# Patient Record
Sex: Female | Born: 1965 | Race: Black or African American | Hispanic: No | Marital: Single | Smoking: Former smoker
Health system: Southern US, Community
[De-identification: ages and names within clinical notes are randomized; demographics above are authoritative.]

## PROBLEM LIST (undated history)

## (undated) ENCOUNTER — Ambulatory Visit: Admission: EM | Payer: Medicare HMO | Source: Home / Self Care

## (undated) DIAGNOSIS — N921 Excessive and frequent menstruation with irregular cycle: Secondary | ICD-10-CM

## (undated) DIAGNOSIS — Z801 Family history of malignant neoplasm of trachea, bronchus and lung: Secondary | ICD-10-CM

## (undated) DIAGNOSIS — Z8619 Personal history of other infectious and parasitic diseases: Secondary | ICD-10-CM

## (undated) DIAGNOSIS — E785 Hyperlipidemia, unspecified: Secondary | ICD-10-CM

## (undated) DIAGNOSIS — E041 Nontoxic single thyroid nodule: Secondary | ICD-10-CM

## (undated) DIAGNOSIS — E0500 Thyrotoxicosis with diffuse goiter without thyrotoxic crisis or storm: Secondary | ICD-10-CM

## (undated) DIAGNOSIS — Z9221 Personal history of antineoplastic chemotherapy: Secondary | ICD-10-CM

## (undated) DIAGNOSIS — I447 Left bundle-branch block, unspecified: Secondary | ICD-10-CM

## (undated) DIAGNOSIS — N189 Chronic kidney disease, unspecified: Secondary | ICD-10-CM

## (undated) DIAGNOSIS — E0590 Thyrotoxicosis, unspecified without thyrotoxic crisis or storm: Secondary | ICD-10-CM

## (undated) DIAGNOSIS — D649 Anemia, unspecified: Secondary | ICD-10-CM

## (undated) DIAGNOSIS — D759 Disease of blood and blood-forming organs, unspecified: Secondary | ICD-10-CM

## (undated) DIAGNOSIS — F419 Anxiety disorder, unspecified: Secondary | ICD-10-CM

## (undated) DIAGNOSIS — J189 Pneumonia, unspecified organism: Secondary | ICD-10-CM

## (undated) DIAGNOSIS — N200 Calculus of kidney: Secondary | ICD-10-CM

## (undated) DIAGNOSIS — I739 Peripheral vascular disease, unspecified: Secondary | ICD-10-CM

## (undated) DIAGNOSIS — Z87442 Personal history of urinary calculi: Secondary | ICD-10-CM

## (undated) DIAGNOSIS — Z872 Personal history of diseases of the skin and subcutaneous tissue: Secondary | ICD-10-CM

## (undated) DIAGNOSIS — J45909 Unspecified asthma, uncomplicated: Secondary | ICD-10-CM

## (undated) DIAGNOSIS — K219 Gastro-esophageal reflux disease without esophagitis: Secondary | ICD-10-CM

## (undated) DIAGNOSIS — E119 Type 2 diabetes mellitus without complications: Secondary | ICD-10-CM

## (undated) DIAGNOSIS — J40 Bronchitis, not specified as acute or chronic: Secondary | ICD-10-CM

## (undated) DIAGNOSIS — Z8042 Family history of malignant neoplasm of prostate: Secondary | ICD-10-CM

## (undated) DIAGNOSIS — E890 Postprocedural hypothyroidism: Secondary | ICD-10-CM

## (undated) DIAGNOSIS — K529 Noninfective gastroenteritis and colitis, unspecified: Secondary | ICD-10-CM

## (undated) DIAGNOSIS — Z923 Personal history of irradiation: Secondary | ICD-10-CM

## (undated) DIAGNOSIS — L732 Hidradenitis suppurativa: Secondary | ICD-10-CM

## (undated) DIAGNOSIS — Z803 Family history of malignant neoplasm of breast: Secondary | ICD-10-CM

## (undated) DIAGNOSIS — C73 Malignant neoplasm of thyroid gland: Secondary | ICD-10-CM

## (undated) DIAGNOSIS — T7840XA Allergy, unspecified, initial encounter: Secondary | ICD-10-CM

## (undated) DIAGNOSIS — D869 Sarcoidosis, unspecified: Secondary | ICD-10-CM

## (undated) HISTORY — DX: Allergy, unspecified, initial encounter: T78.40XA

## (undated) HISTORY — DX: Personal history of diseases of the skin and subcutaneous tissue: Z87.2

## (undated) HISTORY — DX: Nontoxic single thyroid nodule: E04.1

## (undated) HISTORY — DX: Family history of malignant neoplasm of trachea, bronchus and lung: Z80.1

## (undated) HISTORY — DX: Human immunodeficiency virus (HIV) disease: B20

## (undated) HISTORY — DX: Postprocedural hypothyroidism: E89.0

## (undated) HISTORY — DX: Hyperlipidemia, unspecified: E78.5

## (undated) HISTORY — DX: Personal history of other infectious and parasitic diseases: Z86.19

## (undated) HISTORY — DX: Noninfective gastroenteritis and colitis, unspecified: K52.9

## (undated) HISTORY — DX: Calculus of kidney: N20.0

## (undated) HISTORY — DX: Family history of malignant neoplasm of prostate: Z80.42

## (undated) HISTORY — DX: Sarcoidosis, unspecified: D86.9

## (undated) HISTORY — DX: Excessive and frequent menstruation with irregular cycle: N92.1

## (undated) HISTORY — DX: Anemia, unspecified: D64.9

## (undated) HISTORY — DX: Family history of malignant neoplasm of breast: Z80.3

## (undated) HISTORY — DX: Gastro-esophageal reflux disease without esophagitis: K21.9

## (undated) HISTORY — DX: Thyrotoxicosis with diffuse goiter without thyrotoxic crisis or storm: E05.00

## (undated) HISTORY — PX: EYE SURGERY: SHX253

## (undated) HISTORY — DX: Essential (primary) hypertension: I10

## (undated) HISTORY — DX: Thyrotoxicosis, unspecified without thyrotoxic crisis or storm: E05.90

## (undated) NOTE — *Deleted (*Deleted)
09/16/2020 Phaedra Elsey Sep 11, 1966 [id number]   HPI:  [name] is a 22 y.o. female patient of Dr Oval Linsey, with a [place] below who presents today for advanced hypertension clinic follow up.   BP , switched metoprolol to carvedilol 25 mg bid.  Returns today for follow up.  Past Medical History: hyperlipidemia 2/21: TC 217, TG 221, HDL 40, LDL 141 - on rosuvastatin 10  DM2 4/21 A1c 8.0 - on empagliflozin, glipizide, trulicity  HIV On [place] and [name]  sarcoidosis Diagnosed as teen in [place], on prednisone x 2 years, no symptoms since then  Breast cancer ER positive, HER-2 positive T2 N0 stage 1B, chemo, radiation and surgery, completed 1/21, now on tamoxifen daily  gout On prednisone at last ADV HTN visit for flare  OSA Diagnosed 2021, awaiting titration study  hypothyroidism TSA down to 6.4 (was at 17), now on Synthroid 224 mcg daily    Secondary Causes of Hypertension  Medications/Herbal: OCP, steroids, stimulants, antidepressants, weight loss medication, immune suppressants, NSAIDs, sympathomimetics, alcohol, caffeine, licorice, ginseng, St. John's wort, chemo  Sleep Apnea: CPAP pending Renal artery stenosis: Check renal artery Dopplers Hyperaldosteronism: Check once blood pressure more stable Hyper/hypothyroidism: Working on thyroid management Pheochromocytoma: (testing not indicated)  Cushing's syndrome: (testing not indicated)  Coarctation of the aorta: Negative on CT 09/2018  Blood Pressure Goal:  130/80  Current Medications: carvedilol 25 mg bid, irbesartan hctz 150/12.5 qd, spironolactone 25 mg qd  Family Hx:  Social Hx:  Diet:  Exercise:  Home BP readings:  Intolerances:   Labs: 9/21: Na 141, K 3.7, Glu 180, BUN 18, SCR 1.73, GFR 39  Wt Readings from Last 3 Encounters:  08/13/20 234 lb (106.1 kg)  08/10/20 237 lb (107.5 kg)  07/21/20 235 lb 6.4 oz (106.8 kg)   BP Readings from Last 3 Encounters:  08/13/20 (!) 162/102   07/21/20 108/80  07/16/20 (!) 156/90   Pulse Readings from Last 3 Encounters:  08/13/20 71  07/21/20 74  07/16/20 71    Current Outpatient Medications  Medication Sig Dispense Refill  . Blood Glucose Monitoring Suppl (ONETOUCH VERIO) w/Device KIT USE AS INSTRUCTED TO TEST BLOOD SUGAR UP TO 4 TIMES DAILY E11.9 1 kit 0  . carvedilol (COREG) 25 MG tablet Take 1 tablet (25 mg total) by mouth 2 (two) times daily. 180 tablet 3  . DESCOVY 200-25 MG tablet TAKE 1 TABLET BY MOUTH DAILY 30 tablet 2  . doxycycline (VIBRA-TABS) 100 MG tablet doxycycline hyclate 100 mg tablet    . empagliflozin (JARDIANCE) 10 MG TABS tablet Take 1 tablet (10 mg total) by mouth daily before breakfast. 30 tablet 6  . FLOVENT HFA 110 MCG/ACT inhaler TAKE 1 PUFF BY MOUTH TWICE A DAY (Patient taking differently: Inhale 1 puff into the lungs daily as needed (Shortness of breath). ) 36 Inhaler 1  . gabapentin (NEURONTIN) 300 MG capsule Take 1 capsule (300 mg total) by mouth 3 (three) times daily. (Patient taking differently: Take 300 mg by mouth 2 (two) times daily. ) 90 capsule 3  . glipiZIDE (GLUCOTROL) 5 MG tablet TAKE 1 TABLET (5 MG TOTAL) BY MOUTH 2 (TWO) TIMES DAILY BEFORE A MEAL. FOR DIABETES. 180 tablet 1  . glucose blood (ONETOUCH VERIO) test strip USE AS INSTRUCTED TO TEST BLOOD SUGAR DAILY 100 each 2  . irbesartan-hydrochlorothiazide (AVALIDE) 150-12.5 MG tablet TAKE 1 TABLET BY MOUTH EVERY DAY FOR BLOOD PRESSURE 90 tablet 2  . Lancets (ONETOUCH ULTRASOFT) lancets Use as  instructed to test blood sugar daily 100 each 5  . predniSONE (DELTASONE) 20 MG tablet Take 2 tablets daily for three days, then 1 tablet daily for three days for gout flare. 9 tablet 0  . rosuvastatin (CRESTOR) 10 MG tablet TAKE 1 TABLET BY MOUTH EVERY DAY FOR CHOLESTEROL (Patient taking differently: Take 10 mg by mouth daily. FOR CHOLESTEROL) 90 tablet 3  . spironolactone (ALDACTONE) 25 MG tablet TAKE 1 TABLET BY MOUTH EVERY DAY 90 tablet 3  .  SYNTHROID 112 MCG tablet Take 2 tablets (224 mcg total) by mouth daily before breakfast. 60 tablet 5  . tamoxifen (NOLVADEX) 20 MG tablet Take 1 tablet (20 mg total) by mouth daily. 90 tablet 1  . [name] 50 MG tablet TAKE 1 TABLET(50 MG) BY MOUTH DAILY 30 tablet 2  . TRULICITY [id number] SOPN INJECT CONTENTS OF 1 PEN (0.75MG ) INTO THE SKIN ONCE WEEKLY FOR DIABETES 1.5 mL 3   No current facility-administered medications for this visit.   Facility-Administered Medications Ordered in Other Visits  Medication Dose Route Frequency Provider Last Rate Last Admin  . sodium chloride flush (NS) 0.9 % injection 10 mL  10 mL Intracatheter PRN Magrinat, Virgie Dad, MD   10 mL at 09/19/18 1551    Allergies  Allergen Reactions  . Genvoya [Elviteg-Cobic-Emtricit-Tenofaf] Hives  . Lisinopril Cough  . Valsartan Other (See Comments) and Cough    Pt states she tolerates medicine now    Past Medical History:  Diagnosis Date  . Allergy   . Anemia    Normocytic  . Anxiety   . Asthma   . Blood dyscrasia   . Bronchitis 2005  . 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 Diabetes mellitus without complication ([place])   . Family history of breast cancer   . Family history of lung cancer   . Family history of prostate cancer   . Gastroenteritis 07/10/07  . GERD 07/24/2006  . GRAVE'S DISEASE 01/01/2008  . History of hidradenitis suppurativa   . History of kidney stones   . History of thrush   . HIV DISEASE 07/24/2006   dx March 05  . Hyperlipidemia   . HYPERTENSION 07/24/2006  . Hyperthyroidism 08/2006   Grave's Disease -diffuse radiotracer uptake 08/25/06 Thyroid scan-Cold nodule to R lower lobe of thyrorid  . Menometrorrhagia    hx of  . Nephrolithiasis   . 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 Personal history of chemotherapy    2020  . Personal history of radiation therapy    2020  . Pneumonia 2005  . 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 Sarcoidosis 02/08/2007   dx as a teenager in [place] from abnl CXR. Completed 2 yrs Prednisone after lung bx confirmation. No symptoms since then.  . Suppurative hidradenitis   . Thyroid cancer ([place])   . THYROID NODULE, RIGHT 02/08/2007    Last menstrual period 03/31/2014.  No problem-specific Assessment & Plan notes found for this encounter.   Tommy Medal PharmD CPP [facility] [address] [place], [place] [address] [phone]

---

## 1992-10-17 HISTORY — PX: OTHER SURGICAL HISTORY: SHX169

## 1995-10-18 HISTORY — PX: BREAST SURGERY: SHX581

## 1996-10-17 HISTORY — PX: REDUCTION MAMMAPLASTY: SUR839

## 1998-08-10 ENCOUNTER — Encounter: Payer: Self-pay | Admitting: Emergency Medicine

## 1998-08-10 ENCOUNTER — Emergency Department (HOSPITAL_COMMUNITY): Admission: EM | Admit: 1998-08-10 | Discharge: 1998-08-10 | Payer: Self-pay | Admitting: Emergency Medicine

## 1999-05-13 ENCOUNTER — Other Ambulatory Visit: Admission: RE | Admit: 1999-05-13 | Discharge: 1999-05-13 | Payer: Self-pay | Admitting: *Deleted

## 2001-08-02 ENCOUNTER — Emergency Department (HOSPITAL_COMMUNITY): Admission: EM | Admit: 2001-08-02 | Discharge: 2001-08-02 | Payer: Self-pay | Admitting: Emergency Medicine

## 2001-08-02 ENCOUNTER — Encounter: Payer: Self-pay | Admitting: Emergency Medicine

## 2001-12-15 HISTORY — PX: INCISION AND DRAINAGE PERITONSILLAR ABSCESS: SUR670

## 2001-12-25 ENCOUNTER — Emergency Department (HOSPITAL_COMMUNITY): Admission: EM | Admit: 2001-12-25 | Discharge: 2001-12-25 | Payer: Self-pay | Admitting: Emergency Medicine

## 2002-05-01 ENCOUNTER — Other Ambulatory Visit: Admission: RE | Admit: 2002-05-01 | Discharge: 2002-05-01 | Payer: Self-pay | Admitting: *Deleted

## 2002-08-17 HISTORY — PX: INCISE AND DRAIN ABCESS: PRO64

## 2002-09-10 ENCOUNTER — Ambulatory Visit (HOSPITAL_BASED_OUTPATIENT_CLINIC_OR_DEPARTMENT_OTHER): Admission: RE | Admit: 2002-09-10 | Discharge: 2002-09-10 | Payer: Self-pay | Admitting: General Surgery

## 2002-09-11 ENCOUNTER — Encounter (INDEPENDENT_AMBULATORY_CARE_PROVIDER_SITE_OTHER): Payer: Self-pay | Admitting: *Deleted

## 2002-09-14 ENCOUNTER — Emergency Department (HOSPITAL_COMMUNITY): Admission: EM | Admit: 2002-09-14 | Discharge: 2002-09-14 | Payer: Self-pay

## 2002-11-17 HISTORY — PX: DILATION AND CURETTAGE OF UTERUS: SHX78

## 2002-11-30 ENCOUNTER — Observation Stay (HOSPITAL_COMMUNITY): Admission: AD | Admit: 2002-11-30 | Discharge: 2002-11-30 | Payer: Self-pay | Admitting: Obstetrics and Gynecology

## 2002-11-30 ENCOUNTER — Encounter (INDEPENDENT_AMBULATORY_CARE_PROVIDER_SITE_OTHER): Payer: Self-pay

## 2003-10-18 DIAGNOSIS — J189 Pneumonia, unspecified organism: Secondary | ICD-10-CM

## 2003-10-18 DIAGNOSIS — J40 Bronchitis, not specified as acute or chronic: Secondary | ICD-10-CM

## 2003-10-18 HISTORY — DX: Pneumonia, unspecified organism: J18.9

## 2003-10-18 HISTORY — DX: Bronchitis, not specified as acute or chronic: J40

## 2004-01-08 ENCOUNTER — Encounter (INDEPENDENT_AMBULATORY_CARE_PROVIDER_SITE_OTHER): Payer: Self-pay | Admitting: *Deleted

## 2004-01-08 LAB — CONVERTED CEMR LAB
CD4 Count: 44 microliters
CD4 T Cell Abs: 44

## 2004-02-11 ENCOUNTER — Encounter: Admission: RE | Admit: 2004-02-11 | Discharge: 2004-02-11 | Payer: Self-pay | Admitting: Internal Medicine

## 2004-03-02 ENCOUNTER — Encounter: Admission: RE | Admit: 2004-03-02 | Discharge: 2004-03-02 | Payer: Self-pay | Admitting: Internal Medicine

## 2004-04-08 ENCOUNTER — Encounter: Admission: RE | Admit: 2004-04-08 | Discharge: 2004-04-08 | Payer: Self-pay | Admitting: Internal Medicine

## 2004-05-11 ENCOUNTER — Ambulatory Visit (HOSPITAL_COMMUNITY): Admission: RE | Admit: 2004-05-11 | Discharge: 2004-05-11 | Payer: Self-pay | Admitting: Internal Medicine

## 2004-05-11 ENCOUNTER — Encounter: Admission: RE | Admit: 2004-05-11 | Discharge: 2004-05-11 | Payer: Self-pay | Admitting: Internal Medicine

## 2004-06-14 ENCOUNTER — Other Ambulatory Visit: Admission: RE | Admit: 2004-06-14 | Discharge: 2004-06-14 | Payer: Self-pay | Admitting: Obstetrics and Gynecology

## 2004-06-23 ENCOUNTER — Ambulatory Visit: Payer: Self-pay | Admitting: Internal Medicine

## 2004-10-19 ENCOUNTER — Ambulatory Visit (HOSPITAL_COMMUNITY): Admission: RE | Admit: 2004-10-19 | Discharge: 2004-10-19 | Payer: Self-pay | Admitting: Internal Medicine

## 2004-10-19 ENCOUNTER — Ambulatory Visit: Payer: Self-pay | Admitting: Internal Medicine

## 2004-11-03 ENCOUNTER — Ambulatory Visit: Payer: Self-pay | Admitting: Internal Medicine

## 2005-03-01 ENCOUNTER — Ambulatory Visit: Payer: Self-pay | Admitting: Internal Medicine

## 2005-03-01 ENCOUNTER — Ambulatory Visit (HOSPITAL_COMMUNITY): Admission: RE | Admit: 2005-03-01 | Discharge: 2005-03-01 | Payer: Self-pay | Admitting: Internal Medicine

## 2005-03-22 ENCOUNTER — Ambulatory Visit: Payer: Self-pay | Admitting: Internal Medicine

## 2005-06-17 ENCOUNTER — Encounter (INDEPENDENT_AMBULATORY_CARE_PROVIDER_SITE_OTHER): Payer: Self-pay | Admitting: Internal Medicine

## 2005-06-17 LAB — CONVERTED CEMR LAB: Pap Smear: ABNORMAL

## 2005-06-24 ENCOUNTER — Inpatient Hospital Stay (HOSPITAL_COMMUNITY): Admission: AD | Admit: 2005-06-24 | Discharge: 2005-06-24 | Payer: Self-pay | Admitting: Obstetrics and Gynecology

## 2005-07-25 ENCOUNTER — Ambulatory Visit: Payer: Self-pay | Admitting: Internal Medicine

## 2005-07-25 ENCOUNTER — Ambulatory Visit (HOSPITAL_COMMUNITY): Admission: RE | Admit: 2005-07-25 | Discharge: 2005-07-25 | Payer: Self-pay | Admitting: Internal Medicine

## 2005-08-09 ENCOUNTER — Ambulatory Visit: Payer: Self-pay | Admitting: Internal Medicine

## 2006-01-17 ENCOUNTER — Encounter (INDEPENDENT_AMBULATORY_CARE_PROVIDER_SITE_OTHER): Payer: Self-pay | Admitting: *Deleted

## 2006-01-17 ENCOUNTER — Encounter: Admission: RE | Admit: 2006-01-17 | Discharge: 2006-01-17 | Payer: Self-pay | Admitting: Internal Medicine

## 2006-01-17 ENCOUNTER — Ambulatory Visit: Payer: Self-pay | Admitting: Internal Medicine

## 2006-01-17 LAB — CONVERTED CEMR LAB
CD4 Count: 550 microliters
HIV 1 RNA Quant: 399 copies/mL

## 2006-01-31 ENCOUNTER — Ambulatory Visit: Payer: Self-pay | Admitting: Internal Medicine

## 2006-04-17 ENCOUNTER — Encounter (INDEPENDENT_AMBULATORY_CARE_PROVIDER_SITE_OTHER): Payer: Self-pay | Admitting: *Deleted

## 2006-04-17 ENCOUNTER — Encounter: Admission: RE | Admit: 2006-04-17 | Discharge: 2006-04-17 | Payer: Self-pay | Admitting: Internal Medicine

## 2006-04-17 ENCOUNTER — Ambulatory Visit: Payer: Self-pay | Admitting: Internal Medicine

## 2006-04-17 LAB — CONVERTED CEMR LAB
CD4 Count: 460 microliters
HIV 1 RNA Quant: 399 copies/mL

## 2006-05-02 ENCOUNTER — Ambulatory Visit: Payer: Self-pay | Admitting: Internal Medicine

## 2006-07-24 ENCOUNTER — Ambulatory Visit: Payer: Self-pay | Admitting: Infectious Diseases

## 2006-07-24 ENCOUNTER — Encounter: Payer: Self-pay | Admitting: Internal Medicine

## 2006-07-24 ENCOUNTER — Encounter: Admission: RE | Admit: 2006-07-24 | Discharge: 2006-07-24 | Payer: Self-pay | Admitting: Infectious Diseases

## 2006-07-24 ENCOUNTER — Encounter (INDEPENDENT_AMBULATORY_CARE_PROVIDER_SITE_OTHER): Payer: Self-pay | Admitting: *Deleted

## 2006-07-24 DIAGNOSIS — J45909 Unspecified asthma, uncomplicated: Secondary | ICD-10-CM | POA: Insufficient documentation

## 2006-07-24 DIAGNOSIS — F329 Major depressive disorder, single episode, unspecified: Secondary | ICD-10-CM

## 2006-07-24 DIAGNOSIS — Z21 Asymptomatic human immunodeficiency virus [HIV] infection status: Secondary | ICD-10-CM | POA: Insufficient documentation

## 2006-07-24 DIAGNOSIS — K219 Gastro-esophageal reflux disease without esophagitis: Secondary | ICD-10-CM | POA: Insufficient documentation

## 2006-07-24 DIAGNOSIS — B20 Human immunodeficiency virus [HIV] disease: Secondary | ICD-10-CM

## 2006-07-24 DIAGNOSIS — R51 Headache: Secondary | ICD-10-CM | POA: Insufficient documentation

## 2006-07-24 DIAGNOSIS — I10 Essential (primary) hypertension: Secondary | ICD-10-CM | POA: Insufficient documentation

## 2006-07-24 DIAGNOSIS — R519 Headache, unspecified: Secondary | ICD-10-CM | POA: Insufficient documentation

## 2006-07-24 DIAGNOSIS — F32A Depression, unspecified: Secondary | ICD-10-CM | POA: Insufficient documentation

## 2006-07-24 HISTORY — DX: Essential (primary) hypertension: I10

## 2006-07-24 HISTORY — DX: Human immunodeficiency virus (HIV) disease: B20

## 2006-07-24 HISTORY — DX: Gastro-esophageal reflux disease without esophagitis: K21.9

## 2006-07-24 LAB — CONVERTED CEMR LAB
CD4 Count: 450 microliters
HIV 1 RNA Quant: 49 copies/mL
HIV 1 RNA Quant: <50 copies/mL (ref <50)
HIV-1 RNA Quant, Log: <1.7 (ref <1.70)
TSH: 0.109 microintl units/mL

## 2006-07-26 ENCOUNTER — Encounter (INDEPENDENT_AMBULATORY_CARE_PROVIDER_SITE_OTHER): Payer: Self-pay | Admitting: Internal Medicine

## 2006-07-26 ENCOUNTER — Ambulatory Visit: Payer: Self-pay | Admitting: Internal Medicine

## 2006-07-26 LAB — CONVERTED CEMR LAB
Bilirubin Urine: NEGATIVE
Cholesterol: 100 mg/dL (ref 0–200)
Creatinine, Urine: 74.3 mg/dL
HDL: 30 mg/dL — ABNORMAL LOW (ref >39)
Ketones, ur: NEGATIVE mg/dL
LDL Cholesterol: 48 mg/dL (ref 0–99)
Microalb Creat Ratio: 157.6 mg/g — ABNORMAL HIGH (ref 0.0–30.0)
Microalb, Ur: 11.7 mg/dL — ABNORMAL HIGH (ref 0.00–1.89)
Microalbumin U total vol: 11.7 mg/L
Nitrite: NEGATIVE
Protein, ur: 30 mg/dL — AB
Specific Gravity, Urine: 1.012 (ref 1.005–1.03)
Total CHOL/HDL Ratio: 3.3
Triglycerides: 108 mg/dL (ref <150)
Urine Glucose: NEGATIVE mg/dL
Urobilinogen, UA: 0.2 (ref 0.0–1.0)
VLDL: 22 mg/dL (ref 0–40)
pH: 8 (ref 5.0–8.0)

## 2006-08-03 ENCOUNTER — Ambulatory Visit: Payer: Self-pay | Admitting: Internal Medicine

## 2006-08-17 ENCOUNTER — Ambulatory Visit: Payer: Self-pay | Admitting: Internal Medicine

## 2006-08-17 DIAGNOSIS — E0590 Thyrotoxicosis, unspecified without thyrotoxic crisis or storm: Secondary | ICD-10-CM

## 2006-08-17 HISTORY — DX: Thyrotoxicosis, unspecified without thyrotoxic crisis or storm: E05.90

## 2006-08-24 ENCOUNTER — Encounter: Admission: RE | Admit: 2006-08-24 | Discharge: 2006-08-24 | Payer: Self-pay | Admitting: Internal Medicine

## 2006-09-15 ENCOUNTER — Encounter (INDEPENDENT_AMBULATORY_CARE_PROVIDER_SITE_OTHER): Payer: Self-pay | Admitting: Internal Medicine

## 2006-09-15 ENCOUNTER — Ambulatory Visit: Payer: Self-pay | Admitting: Internal Medicine

## 2006-09-15 LAB — CONVERTED CEMR LAB
ALT: 30 units/L (ref 0–35)
AST: 53 units/L — ABNORMAL HIGH (ref 0–37)
Albumin: 2.6 g/dL — ABNORMAL LOW (ref 3.5–5.2)
Alkaline Phosphatase: 163 units/L — ABNORMAL HIGH (ref 39–117)
BUN: 9 mg/dL (ref 6–23)
Basophils Absolute: 0 10*3/uL (ref 0.0–0.1)
Basophils Relative: 0 % (ref 0–1)
CO2: 25 meq/L (ref 19–32)
Calcium: 9.4 mg/dL (ref 8.4–10.5)
Chloride: 105 meq/L (ref 96–112)
Creatinine, Ser: 0.3 mg/dL — ABNORMAL LOW (ref 0.40–1.20)
Eosinophils Relative: 2 % (ref 0–4)
Free T4: 5.42 ng/dL — ABNORMAL HIGH (ref 0.89–1.80)
Glucose, Bld: 118 mg/dL — ABNORMAL HIGH (ref 70–99)
HCT: 32.3 % — ABNORMAL LOW (ref 34.4–43.3)
Hemoglobin: 10.9 g/dL — ABNORMAL LOW (ref 11.7–14.8)
INR: 1.1 (ref 0.0–1.5)
Lymphocytes Relative: 48 % — ABNORMAL HIGH (ref 15–43)
Lymphs Abs: 2.4 10*3/uL (ref 0.8–3.1)
MCHC: 33.6 g/dL (ref 33.1–35.4)
MCV: 80.4 fL (ref 78.8–100.0)
Monocytes Absolute: 0.5 10*3/uL (ref 0.2–0.7)
Monocytes Relative: 11 % (ref 3–11)
Neutro Abs: 1.9 10*3/uL (ref 1.8–6.8)
Neutrophils Relative %: 39 % — ABNORMAL LOW (ref 47–77)
Platelets: 371 10*3/uL (ref 152–374)
Potassium: 3.4 meq/L — ABNORMAL LOW (ref 3.5–5.3)
Prothrombin Time: 14.4 s (ref 11.6–15.2)
RBC: 4.01 M/uL (ref 3.79–4.96)
RDW: 15.9 % — ABNORMAL HIGH (ref 11.5–15.3)
Sodium: 137 meq/L (ref 135–145)
TSH: 0.156 microintl units/mL — ABNORMAL LOW (ref 0.350–5.50)
Total Bilirubin: 0.3 mg/dL (ref 0.3–1.2)
Total Protein: 6.8 g/dL (ref 6.0–8.3)
WBC: 5 10*3/uL (ref 3.7–10.0)
aPTT: 31 s

## 2006-12-05 ENCOUNTER — Inpatient Hospital Stay (HOSPITAL_COMMUNITY): Admission: AD | Admit: 2006-12-05 | Discharge: 2006-12-05 | Payer: Self-pay | Admitting: Obstetrics & Gynecology

## 2006-12-11 ENCOUNTER — Encounter (INDEPENDENT_AMBULATORY_CARE_PROVIDER_SITE_OTHER): Payer: Self-pay | Admitting: *Deleted

## 2006-12-11 LAB — CONVERTED CEMR LAB

## 2006-12-24 ENCOUNTER — Encounter (INDEPENDENT_AMBULATORY_CARE_PROVIDER_SITE_OTHER): Payer: Self-pay | Admitting: *Deleted

## 2007-01-09 ENCOUNTER — Telehealth: Payer: Self-pay | Admitting: *Deleted

## 2007-02-08 ENCOUNTER — Ambulatory Visit: Payer: Self-pay | Admitting: Internal Medicine

## 2007-02-08 ENCOUNTER — Encounter (INDEPENDENT_AMBULATORY_CARE_PROVIDER_SITE_OTHER): Payer: Self-pay | Admitting: Internal Medicine

## 2007-02-08 DIAGNOSIS — E041 Nontoxic single thyroid nodule: Secondary | ICD-10-CM | POA: Insufficient documentation

## 2007-02-08 DIAGNOSIS — E0500 Thyrotoxicosis with diffuse goiter without thyrotoxic crisis or storm: Secondary | ICD-10-CM

## 2007-02-08 DIAGNOSIS — D869 Sarcoidosis, unspecified: Secondary | ICD-10-CM | POA: Insufficient documentation

## 2007-02-08 DIAGNOSIS — F17210 Nicotine dependence, cigarettes, uncomplicated: Secondary | ICD-10-CM | POA: Insufficient documentation

## 2007-02-08 HISTORY — DX: Nontoxic single thyroid nodule: E04.1

## 2007-02-08 HISTORY — DX: Sarcoidosis, unspecified: D86.9

## 2007-02-08 HISTORY — DX: Thyrotoxicosis with diffuse goiter without thyrotoxic crisis or storm: E05.00

## 2007-02-09 LAB — CONVERTED CEMR LAB
ALT: 19 units/L (ref 0–35)
AST: 25 units/L (ref 0–37)
Albumin: 3.8 g/dL (ref 3.5–5.2)
Alkaline Phosphatase: 170 units/L — ABNORMAL HIGH (ref 39–117)
BUN: 16 mg/dL (ref 6–23)
Basophils Absolute: 0 10*3/uL (ref 0.0–0.1)
Basophils Relative: 0 % (ref 0–1)
Bilirubin Urine: NEGATIVE
CO2: 23 meq/L (ref 19–32)
Calcium: 9.3 mg/dL (ref 8.4–10.5)
Chloride: 108 meq/L (ref 96–112)
Creatinine, Ser: 0.6 mg/dL (ref 0.40–1.20)
Creatinine, Urine: 70.7 mg/dL
Eosinophils Absolute: 0.1 10*3/uL (ref 0.0–0.7)
Eosinophils Relative: 2 % (ref 0–5)
Free T4: 2.1 ng/dL — ABNORMAL HIGH (ref 0.89–1.80)
Glucose, Bld: 80 mg/dL (ref 70–99)
HCT: 38.4 % (ref 36.0–46.0)
Hemoglobin: 12.5 g/dL (ref 12.0–15.0)
Iron: 26 ug/dL — ABNORMAL LOW (ref 42–145)
Ketones, ur: NEGATIVE mg/dL
Lymphocytes Relative: 48 % — ABNORMAL HIGH (ref 12–46)
Lymphs Abs: 3.1 10*3/uL (ref 0.7–3.3)
MCHC: 32.6 g/dL (ref 30.0–36.0)
MCV: 81.5 fL (ref 78.0–100.0)
Microalb Creat Ratio: 145.7 mg/g — ABNORMAL HIGH (ref 0.0–30.0)
Microalb, Ur: 10.3 mg/dL — ABNORMAL HIGH (ref 0.00–1.89)
Monocytes Absolute: 0.6 10*3/uL (ref 0.2–0.7)
Monocytes Relative: 9 % (ref 3–11)
Neutro Abs: 2.6 10*3/uL (ref 1.7–7.7)
Neutrophils Relative %: 41 % — ABNORMAL LOW (ref 43–77)
Nitrite: POSITIVE — AB
Platelets: 331 10*3/uL (ref 150–400)
Potassium: 4.1 meq/L (ref 3.5–5.3)
Protein, ur: 30 mg/dL — AB
RBC: 4.71 M/uL (ref 3.87–5.11)
RDW: 15.1 % — ABNORMAL HIGH (ref 11.5–14.0)
Retic Count, Absolute: 65.9 (ref 19.0–186.0)
Retic Ct Pct: 1.4 % (ref 0.4–3.1)
Saturation Ratios: 7 % — ABNORMAL LOW (ref 20–55)
Sodium: 137 meq/L (ref 135–145)
Specific Gravity, Urine: 1.015 (ref 1.005–1.03)
TIBC: 371 ug/dL (ref 250–470)
TSH: 0.127 microintl units/mL — ABNORMAL LOW (ref 0.350–5.50)
Total Bilirubin: 0.2 mg/dL — ABNORMAL LOW (ref 0.3–1.2)
Total Protein: 7.4 g/dL (ref 6.0–8.3)
UIBC: 345 ug/dL
Urine Glucose: NEGATIVE mg/dL
Urobilinogen, UA: 0.2 (ref 0.0–1.0)
WBC: 6.4 10*3/uL (ref 4.0–10.5)
pH: 7 (ref 5.0–8.0)

## 2007-02-10 ENCOUNTER — Encounter (INDEPENDENT_AMBULATORY_CARE_PROVIDER_SITE_OTHER): Payer: Self-pay | Admitting: Internal Medicine

## 2007-02-11 ENCOUNTER — Telehealth (INDEPENDENT_AMBULATORY_CARE_PROVIDER_SITE_OTHER): Payer: Self-pay | Admitting: Internal Medicine

## 2007-02-15 ENCOUNTER — Ambulatory Visit (HOSPITAL_COMMUNITY): Admission: RE | Admit: 2007-02-15 | Discharge: 2007-02-15 | Payer: Self-pay | Admitting: Internal Medicine

## 2007-02-20 ENCOUNTER — Telehealth (INDEPENDENT_AMBULATORY_CARE_PROVIDER_SITE_OTHER): Payer: Self-pay | Admitting: Internal Medicine

## 2007-02-21 ENCOUNTER — Encounter (INDEPENDENT_AMBULATORY_CARE_PROVIDER_SITE_OTHER): Payer: Self-pay | Admitting: Internal Medicine

## 2007-02-22 ENCOUNTER — Encounter (INDEPENDENT_AMBULATORY_CARE_PROVIDER_SITE_OTHER): Payer: Self-pay | Admitting: Specialist

## 2007-02-22 ENCOUNTER — Other Ambulatory Visit: Admission: RE | Admit: 2007-02-22 | Discharge: 2007-02-22 | Payer: Self-pay | Admitting: Interventional Radiology

## 2007-02-22 ENCOUNTER — Encounter: Admission: RE | Admit: 2007-02-22 | Discharge: 2007-02-22 | Payer: Self-pay | Admitting: *Deleted

## 2007-05-28 ENCOUNTER — Telehealth (INDEPENDENT_AMBULATORY_CARE_PROVIDER_SITE_OTHER): Payer: Self-pay | Admitting: *Deleted

## 2007-06-04 ENCOUNTER — Encounter: Payer: Self-pay | Admitting: Internal Medicine

## 2007-06-04 ENCOUNTER — Ambulatory Visit: Payer: Self-pay | Admitting: Internal Medicine

## 2007-06-04 ENCOUNTER — Encounter: Admission: RE | Admit: 2007-06-04 | Discharge: 2007-06-04 | Payer: Self-pay | Admitting: Internal Medicine

## 2007-06-04 LAB — CONVERTED CEMR LAB
HIV 1 RNA Quant: 88 copies/mL — ABNORMAL HIGH (ref <50)
HIV-1 RNA Quant, Log: 1.94 — ABNORMAL HIGH (ref <1.70)

## 2007-06-05 ENCOUNTER — Encounter (INDEPENDENT_AMBULATORY_CARE_PROVIDER_SITE_OTHER): Payer: Self-pay | Admitting: *Deleted

## 2007-06-05 LAB — CONVERTED CEMR LAB
ALT: 14 units/L (ref 0–35)
AST: 17 units/L (ref 0–37)
Albumin: 4.1 g/dL (ref 3.5–5.2)
Alkaline Phosphatase: 175 units/L — ABNORMAL HIGH (ref 39–117)
BUN: 14 mg/dL (ref 6–23)
Basophils Absolute: 0 10*3/uL (ref 0.0–0.1)
Basophils Relative: 0 % (ref 0–1)
CO2: 21 meq/L (ref 19–32)
Calcium: 9.5 mg/dL (ref 8.4–10.5)
Chloride: 107 meq/L (ref 96–112)
Cholesterol: 140 mg/dL (ref 0–200)
Creatinine, Ser: 0.75 mg/dL (ref 0.40–1.20)
Eosinophils Absolute: 0.1 10*3/uL (ref 0.0–0.7)
Eosinophils Relative: 1 % (ref 0–5)
Free T4: 0.87 ng/dL — ABNORMAL LOW (ref 0.89–1.80)
Glucose, Bld: 96 mg/dL (ref 70–99)
HCT: 41.8 % (ref 36.0–46.0)
HDL: 44 mg/dL (ref >39)
Hemoglobin: 13.6 g/dL (ref 12.0–15.0)
LDL Cholesterol: 81 mg/dL (ref 0–99)
Lymphocytes Relative: 39 % (ref 12–46)
Lymphs Abs: 3 10*3/uL (ref 0.7–3.3)
MCHC: 32.5 g/dL (ref 30.0–36.0)
MCV: 84.8 fL (ref 78.0–100.0)
Monocytes Absolute: 0.6 10*3/uL (ref 0.2–0.7)
Monocytes Relative: 8 % (ref 3–11)
Neutro Abs: 4 10*3/uL (ref 1.7–7.7)
Neutrophils Relative %: 52 % (ref 43–77)
Platelets: 408 10*3/uL — ABNORMAL HIGH (ref 150–400)
Potassium: 4.3 meq/L (ref 3.5–5.3)
RBC: 4.93 M/uL (ref 3.87–5.11)
RDW: 15.7 % — ABNORMAL HIGH (ref 11.5–14.0)
Sodium: 141 meq/L (ref 135–145)
TSH: 0.093 microintl units/mL — ABNORMAL LOW (ref 0.350–5.50)
Total Bilirubin: 0.3 mg/dL (ref 0.3–1.2)
Total CHOL/HDL Ratio: 3.2
Total Protein: 7.3 g/dL (ref 6.0–8.3)
Triglycerides: 76 mg/dL (ref <150)
VLDL: 15 mg/dL (ref 0–40)
WBC: 7.7 10*3/uL (ref 4.0–10.5)

## 2007-06-25 ENCOUNTER — Encounter: Payer: Self-pay | Admitting: Internal Medicine

## 2007-06-26 ENCOUNTER — Telehealth (INDEPENDENT_AMBULATORY_CARE_PROVIDER_SITE_OTHER): Payer: Self-pay | Admitting: *Deleted

## 2007-06-26 ENCOUNTER — Ambulatory Visit: Payer: Self-pay | Admitting: Internal Medicine

## 2007-06-26 ENCOUNTER — Encounter (INDEPENDENT_AMBULATORY_CARE_PROVIDER_SITE_OTHER): Payer: Self-pay | Admitting: *Deleted

## 2007-06-26 LAB — CONVERTED CEMR LAB
BUN: 16 mg/dL (ref 6–23)
CO2: 23 meq/L (ref 19–32)
Calcium: 9.4 mg/dL (ref 8.4–10.5)
Chloride: 107 meq/L (ref 96–112)
Creatinine, Ser: 0.73 mg/dL (ref 0.40–1.20)
Glucose, Bld: 75 mg/dL (ref 70–99)
Potassium: 4 meq/L (ref 3.5–5.3)
Sodium: 142 meq/L (ref 135–145)

## 2007-07-10 ENCOUNTER — Ambulatory Visit: Payer: Self-pay | Admitting: Hospitalist

## 2007-07-10 ENCOUNTER — Telehealth: Payer: Self-pay | Admitting: Internal Medicine

## 2007-07-10 DIAGNOSIS — K529 Noninfective gastroenteritis and colitis, unspecified: Secondary | ICD-10-CM

## 2007-07-10 HISTORY — DX: Noninfective gastroenteritis and colitis, unspecified: K52.9

## 2007-08-10 ENCOUNTER — Telehealth: Payer: Self-pay | Admitting: Internal Medicine

## 2007-08-17 ENCOUNTER — Telehealth: Payer: Self-pay | Admitting: Internal Medicine

## 2007-11-02 ENCOUNTER — Encounter: Payer: Self-pay | Admitting: Internal Medicine

## 2007-12-24 ENCOUNTER — Encounter: Admission: RE | Admit: 2007-12-24 | Discharge: 2007-12-24 | Payer: Self-pay | Admitting: Internal Medicine

## 2007-12-24 ENCOUNTER — Ambulatory Visit: Payer: Self-pay | Admitting: Internal Medicine

## 2007-12-24 LAB — CONVERTED CEMR LAB
ALT: 16 units/L (ref 0–35)
AST: 16 units/L (ref 0–37)
Albumin: 4 g/dL (ref 3.5–5.2)
Alkaline Phosphatase: 153 units/L — ABNORMAL HIGH (ref 39–117)
BUN: 11 mg/dL (ref 6–23)
CO2: 26 meq/L (ref 19–32)
Calcium: 9.1 mg/dL (ref 8.4–10.5)
Chloride: 106 meq/L (ref 96–112)
Cholesterol: 147 mg/dL (ref 0–200)
Creatinine, Ser: 0.66 mg/dL (ref 0.40–1.20)
Glucose, Bld: 87 mg/dL (ref 70–99)
HCT: 39.2 % (ref 36.0–46.0)
HDL: 39 mg/dL — ABNORMAL LOW (ref >39)
HIV 1 RNA Quant: 95 copies/mL — ABNORMAL HIGH (ref <50)
HIV-1 RNA Quant, Log: 1.98 — ABNORMAL HIGH (ref <1.70)
Hemoglobin: 13.3 g/dL (ref 12.0–15.0)
LDL Cholesterol: 88 mg/dL (ref 0–99)
MCHC: 33.9 g/dL (ref 30.0–36.0)
MCV: 83.6 fL (ref 78.0–100.0)
Platelets: 403 10*3/uL — ABNORMAL HIGH (ref 150–400)
Potassium: 3.7 meq/L (ref 3.5–5.3)
RBC: 4.69 M/uL (ref 3.87–5.11)
RDW: 15.5 % (ref 11.5–15.5)
Sodium: 141 meq/L (ref 135–145)
Total Bilirubin: 0.3 mg/dL (ref 0.3–1.2)
Total CHOL/HDL Ratio: 3.8
Total Protein: 7.4 g/dL (ref 6.0–8.3)
Triglycerides: 99 mg/dL (ref <150)
VLDL: 20 mg/dL (ref 0–40)
WBC: 7.1 10*3/uL (ref 4.0–10.5)

## 2008-01-01 ENCOUNTER — Ambulatory Visit: Payer: Self-pay | Admitting: Internal Medicine

## 2008-01-01 DIAGNOSIS — L0292 Furuncle, unspecified: Secondary | ICD-10-CM | POA: Insufficient documentation

## 2008-01-01 DIAGNOSIS — L0293 Carbuncle, unspecified: Secondary | ICD-10-CM

## 2008-01-01 DIAGNOSIS — E0500 Thyrotoxicosis with diffuse goiter without thyrotoxic crisis or storm: Secondary | ICD-10-CM

## 2008-01-01 HISTORY — DX: Thyrotoxicosis with diffuse goiter without thyrotoxic crisis or storm: E05.00

## 2008-01-15 ENCOUNTER — Telehealth: Payer: Self-pay | Admitting: Internal Medicine

## 2008-01-15 ENCOUNTER — Encounter: Payer: Self-pay | Admitting: Internal Medicine

## 2008-01-23 ENCOUNTER — Ambulatory Visit: Payer: Self-pay | Admitting: Infectious Disease

## 2008-01-23 ENCOUNTER — Encounter (INDEPENDENT_AMBULATORY_CARE_PROVIDER_SITE_OTHER): Payer: Self-pay | Admitting: *Deleted

## 2008-01-23 DIAGNOSIS — R109 Unspecified abdominal pain: Secondary | ICD-10-CM | POA: Insufficient documentation

## 2008-01-23 LAB — CONVERTED CEMR LAB
ALT: 27 units/L (ref 0–35)
AST: 26 units/L (ref 0–37)
Albumin: 3.6 g/dL (ref 3.5–5.2)
Alkaline Phosphatase: 161 units/L — ABNORMAL HIGH (ref 39–117)
BUN: 13 mg/dL (ref 6–23)
Basophils Absolute: 0.1 10*3/uL (ref 0.0–0.1)
Basophils Relative: 1 % (ref 0–1)
Bilirubin Urine: NEGATIVE
CO2: 27 meq/L (ref 19–32)
Calcium: 9.7 mg/dL (ref 8.4–10.5)
Chloride: 100 meq/L (ref 96–112)
Creatinine, Ser: 0.62 mg/dL (ref 0.40–1.20)
Eosinophils Absolute: 0.2 10*3/uL (ref 0.0–0.7)
Eosinophils Relative: 2 % (ref 0–5)
Glucose, Bld: 92 mg/dL (ref 70–99)
HCT: 42.7 % (ref 36.0–46.0)
Hemoglobin: 14.5 g/dL (ref 12.0–15.0)
Lipase: 19 units/L (ref 11–59)
Lymphocytes Relative: 30 % (ref 12–46)
Lymphs Abs: 2.5 10*3/uL (ref 0.7–4.0)
MCHC: 34.1 g/dL (ref 30.0–36.0)
MCV: 86.9 fL (ref 78.0–100.0)
Monocytes Absolute: 0.9 10*3/uL (ref 0.1–1.0)
Monocytes Relative: 11 % (ref 3–12)
Neutro Abs: 4.7 10*3/uL (ref 1.7–7.7)
Neutrophils Relative %: 56 % (ref 43–77)
Nitrite: NEGATIVE
Platelets: 416 10*3/uL — ABNORMAL HIGH (ref 150–400)
Potassium: 4 meq/L (ref 3.5–5.3)
Protein, ur: 30 mg/dL — AB
RBC: 4.91 M/uL (ref 3.87–5.11)
RDW: 15.1 % (ref 11.5–15.5)
Sodium: 137 meq/L (ref 135–145)
Specific Gravity, Urine: 1.018 (ref 1.005–1.03)
Total Bilirubin: 0.2 mg/dL — ABNORMAL LOW (ref 0.3–1.2)
Total Protein: 7.8 g/dL (ref 6.0–8.3)
Urine Glucose: NEGATIVE mg/dL
Urobilinogen, UA: 1 (ref 0.0–1.0)
WBC: 8.4 10*3/uL (ref 4.0–10.5)
pH: 7 (ref 5.0–8.0)

## 2008-02-05 ENCOUNTER — Encounter (INDEPENDENT_AMBULATORY_CARE_PROVIDER_SITE_OTHER): Payer: Self-pay | Admitting: *Deleted

## 2008-03-17 ENCOUNTER — Telehealth (INDEPENDENT_AMBULATORY_CARE_PROVIDER_SITE_OTHER): Payer: Self-pay | Admitting: *Deleted

## 2008-05-07 ENCOUNTER — Ambulatory Visit: Payer: Self-pay | Admitting: Internal Medicine

## 2008-05-19 ENCOUNTER — Telehealth (INDEPENDENT_AMBULATORY_CARE_PROVIDER_SITE_OTHER): Payer: Self-pay | Admitting: *Deleted

## 2008-07-22 ENCOUNTER — Ambulatory Visit: Payer: Self-pay | Admitting: Internal Medicine

## 2008-07-22 LAB — CONVERTED CEMR LAB
HIV 1 RNA Quant: 82 copies/mL — ABNORMAL HIGH (ref <50)
HIV-1 RNA Quant, Log: 1.91 — ABNORMAL HIGH (ref <1.70)

## 2008-08-05 ENCOUNTER — Telehealth (INDEPENDENT_AMBULATORY_CARE_PROVIDER_SITE_OTHER): Payer: Self-pay | Admitting: *Deleted

## 2008-08-14 ENCOUNTER — Telehealth: Payer: Self-pay | Admitting: Internal Medicine

## 2008-09-15 ENCOUNTER — Encounter (HOSPITAL_BASED_OUTPATIENT_CLINIC_OR_DEPARTMENT_OTHER): Payer: Self-pay | Admitting: General Surgery

## 2008-09-15 ENCOUNTER — Ambulatory Visit (HOSPITAL_COMMUNITY): Admission: RE | Admit: 2008-09-15 | Discharge: 2008-09-16 | Payer: Self-pay | Admitting: General Surgery

## 2008-09-17 ENCOUNTER — Telehealth (INDEPENDENT_AMBULATORY_CARE_PROVIDER_SITE_OTHER): Payer: Self-pay | Admitting: *Deleted

## 2008-10-17 HISTORY — PX: TOTAL THYROIDECTOMY: SHX2547

## 2008-12-24 ENCOUNTER — Telehealth (INDEPENDENT_AMBULATORY_CARE_PROVIDER_SITE_OTHER): Payer: Self-pay | Admitting: *Deleted

## 2009-02-02 ENCOUNTER — Ambulatory Visit: Payer: Self-pay | Admitting: Internal Medicine

## 2009-02-02 LAB — CONVERTED CEMR LAB
HIV 1 RNA Quant: 117 copies/mL — ABNORMAL HIGH (ref <48)
HIV-1 RNA Quant, Log: 2.07 — ABNORMAL HIGH (ref <1.68)

## 2009-02-03 ENCOUNTER — Encounter: Payer: Self-pay | Admitting: Internal Medicine

## 2009-02-03 LAB — CONVERTED CEMR LAB
ALT: 13 units/L (ref 0–35)
AST: 17 units/L (ref 0–37)
Albumin: 3.9 g/dL (ref 3.5–5.2)
Alkaline Phosphatase: 138 units/L — ABNORMAL HIGH (ref 39–117)
BUN: 14 mg/dL (ref 6–23)
Basophils Absolute: 0 10*3/uL (ref 0.0–0.1)
Basophils Relative: 0 % (ref 0–1)
CO2: 23 meq/L (ref 19–32)
Calcium: 8.9 mg/dL (ref 8.4–10.5)
Chloride: 107 meq/L (ref 96–112)
Cholesterol: 160 mg/dL (ref 0–200)
Creatinine, Ser: 0.86 mg/dL (ref 0.40–1.20)
Eosinophils Absolute: 0.1 10*3/uL (ref 0.0–0.7)
Eosinophils Relative: 1 % (ref 0–5)
GFR calc Af Amer: >60 mL/min (ref >60)
GFR calc non Af Amer: >60 mL/min (ref >60)
Glucose, Bld: 81 mg/dL (ref 70–99)
HCT: 33.3 % — ABNORMAL LOW (ref 36.0–46.0)
HDL: 39 mg/dL — ABNORMAL LOW (ref >39)
Hemoglobin: 10.8 g/dL — ABNORMAL LOW (ref 12.0–15.0)
LDL Cholesterol: 99 mg/dL (ref 0–99)
Lymphocytes Relative: 36 % (ref 12–46)
Lymphs Abs: 3.4 10*3/uL (ref 0.7–4.0)
MCHC: 32.4 g/dL (ref 30.0–36.0)
MCV: 84.9 fL (ref 78.0–100.0)
Monocytes Absolute: 0.9 10*3/uL (ref 0.1–1.0)
Monocytes Relative: 9 % (ref 3–12)
Neutro Abs: 5.1 10*3/uL (ref 1.7–7.7)
Neutrophils Relative %: 54 % (ref 43–77)
Platelets: 404 10*3/uL — ABNORMAL HIGH (ref 150–400)
Potassium: 3.8 meq/L (ref 3.5–5.3)
RBC: 3.92 M/uL (ref 3.87–5.11)
RDW: 17.4 % — ABNORMAL HIGH (ref 11.5–15.5)
Sodium: 141 meq/L (ref 135–145)
Total Bilirubin: 0.2 mg/dL — ABNORMAL LOW (ref 0.3–1.2)
Total CHOL/HDL Ratio: 4.1
Total Protein: 7.6 g/dL (ref 6.0–8.3)
Triglycerides: 109 mg/dL (ref <150)
VLDL: 22 mg/dL (ref 0–40)
WBC: 9.5 10*3/uL (ref 4.0–10.5)

## 2009-02-09 ENCOUNTER — Encounter: Payer: Self-pay | Admitting: Internal Medicine

## 2009-02-17 ENCOUNTER — Ambulatory Visit: Payer: Self-pay | Admitting: Internal Medicine

## 2009-03-06 ENCOUNTER — Telehealth (INDEPENDENT_AMBULATORY_CARE_PROVIDER_SITE_OTHER): Payer: Self-pay | Admitting: *Deleted

## 2009-08-03 ENCOUNTER — Ambulatory Visit: Payer: Self-pay | Admitting: Internal Medicine

## 2009-08-03 LAB — CONVERTED CEMR LAB
HIV 1 RNA Quant: <48 copies/mL (ref <48)
HIV-1 RNA Quant, Log: <1.68 (ref <1.68)

## 2009-08-17 ENCOUNTER — Ambulatory Visit: Payer: Self-pay | Admitting: Internal Medicine

## 2009-11-17 ENCOUNTER — Telehealth (INDEPENDENT_AMBULATORY_CARE_PROVIDER_SITE_OTHER): Payer: Self-pay | Admitting: *Deleted

## 2009-12-21 ENCOUNTER — Telehealth (INDEPENDENT_AMBULATORY_CARE_PROVIDER_SITE_OTHER): Payer: Self-pay | Admitting: *Deleted

## 2009-12-21 ENCOUNTER — Ambulatory Visit: Payer: Self-pay | Admitting: Internal Medicine

## 2009-12-21 ENCOUNTER — Ambulatory Visit (HOSPITAL_COMMUNITY): Admission: RE | Admit: 2009-12-21 | Discharge: 2009-12-21 | Payer: Self-pay | Admitting: Internal Medicine

## 2009-12-21 ENCOUNTER — Encounter: Payer: Self-pay | Admitting: Internal Medicine

## 2009-12-21 DIAGNOSIS — M25579 Pain in unspecified ankle and joints of unspecified foot: Secondary | ICD-10-CM | POA: Insufficient documentation

## 2009-12-21 DIAGNOSIS — J069 Acute upper respiratory infection, unspecified: Secondary | ICD-10-CM | POA: Insufficient documentation

## 2009-12-21 LAB — CONVERTED CEMR LAB
ALT: 15 units/L (ref 0–35)
AST: 19 units/L (ref 0–37)
Albumin: 3.5 g/dL (ref 3.5–5.2)
Alkaline Phosphatase: 112 units/L (ref 39–117)
BUN: 8 mg/dL (ref 6–23)
Basophils Absolute: 0 10*3/uL (ref 0.0–0.1)
Basophils Relative: 0 % (ref 0–1)
CO2: 26 meq/L (ref 19–32)
Calcium: 8 mg/dL — ABNORMAL LOW (ref 8.4–10.5)
Chloride: 107 meq/L (ref 96–112)
Creatinine, Ser: 0.78 mg/dL (ref 0.40–1.20)
Eosinophils Absolute: 0.2 10*3/uL (ref 0.0–0.7)
Eosinophils Relative: 3 % (ref 0–5)
Glucose, Bld: 82 mg/dL (ref 70–99)
HCT: 32 % — ABNORMAL LOW (ref 36.0–46.0)
Hemoglobin: 10.7 g/dL — ABNORMAL LOW (ref 12.0–15.0)
Lymphocytes Relative: 38 % (ref 12–46)
Lymphs Abs: 3 10*3/uL (ref 0.7–4.0)
MCHC: 33.4 g/dL (ref 30.0–36.0)
MCV: 84.9 fL (ref >=78.0)
Monocytes Absolute: 0.7 10*3/uL (ref 0.1–1.0)
Monocytes Relative: 9 % (ref 3–12)
Neutro Abs: 3.9 10*3/uL (ref 1.7–7.7)
Neutrophils Relative %: 50 % (ref 43–77)
Platelets: 389 10*3/uL (ref 150–400)
Potassium: 3.5 meq/L (ref 3.5–5.3)
RBC: 3.78 M/uL — ABNORMAL LOW (ref 3.87–5.11)
RDW: 18.9 % — ABNORMAL HIGH (ref 11.5–15.5)
Sodium: 137 meq/L (ref 135–145)
Total Bilirubin: 0.4 mg/dL (ref 0.3–1.2)
Total Protein: 7.5 g/dL (ref 6.0–8.3)
WBC: 7.9 10*3/uL (ref 4.0–10.5)

## 2009-12-23 ENCOUNTER — Encounter: Payer: Self-pay | Admitting: Internal Medicine

## 2009-12-24 ENCOUNTER — Telehealth: Payer: Self-pay | Admitting: Internal Medicine

## 2009-12-28 ENCOUNTER — Encounter: Payer: Self-pay | Admitting: Internal Medicine

## 2009-12-28 ENCOUNTER — Ambulatory Visit: Payer: Self-pay | Admitting: Infectious Diseases

## 2010-04-06 ENCOUNTER — Ambulatory Visit: Payer: Self-pay | Admitting: Internal Medicine

## 2010-04-06 LAB — CONVERTED CEMR LAB
ALT: 13 units/L (ref 0–35)
AST: 12 units/L (ref 0–37)
Albumin: 3.8 g/dL (ref 3.5–5.2)
Alkaline Phosphatase: 116 units/L (ref 39–117)
BUN: 24 mg/dL — ABNORMAL HIGH (ref 6–23)
Basophils Absolute: 0 10*3/uL (ref 0.0–0.1)
Basophils Relative: 0 % (ref 0–1)
CO2: 23 meq/L (ref 19–32)
Calcium: 8.5 mg/dL (ref 8.4–10.5)
Chloride: 105 meq/L (ref 96–112)
Cholesterol: 150 mg/dL (ref 0–200)
Creatinine, Ser: 0.85 mg/dL (ref 0.40–1.20)
Eosinophils Absolute: 0.2 10*3/uL (ref 0.0–0.7)
Eosinophils Relative: 2 % (ref 0–5)
Glucose, Bld: 92 mg/dL (ref 70–99)
HCT: 33.2 % — ABNORMAL LOW (ref 36.0–46.0)
HDL: 37 mg/dL — ABNORMAL LOW (ref >39)
HIV 1 RNA Quant: <48 copies/mL (ref <48)
HIV-1 RNA Quant, Log: <1.68 (ref <1.68)
Hemoglobin: 10.4 g/dL — ABNORMAL LOW (ref 12.0–15.0)
LDL Cholesterol: 87 mg/dL (ref 0–99)
Lymphocytes Relative: 38 % (ref 12–46)
Lymphs Abs: 2.9 10*3/uL (ref 0.7–4.0)
MCHC: 31.3 g/dL (ref 30.0–36.0)
MCV: 83.2 fL (ref 78.0–100.0)
Monocytes Absolute: 0.6 10*3/uL (ref 0.1–1.0)
Monocytes Relative: 8 % (ref 3–12)
Neutro Abs: 3.9 10*3/uL (ref 1.7–7.7)
Neutrophils Relative %: 52 % (ref 43–77)
Platelets: 376 10*3/uL (ref 150–400)
Potassium: 3.9 meq/L (ref 3.5–5.3)
RBC: 3.99 M/uL (ref 3.87–5.11)
RDW: 17.3 % — ABNORMAL HIGH (ref 11.5–15.5)
Sodium: 138 meq/L (ref 135–145)
Total Bilirubin: 0.2 mg/dL — ABNORMAL LOW (ref 0.3–1.2)
Total CHOL/HDL Ratio: 4.1
Total Protein: 7.1 g/dL (ref 6.0–8.3)
Triglycerides: 130 mg/dL (ref <150)
VLDL: 26 mg/dL (ref 0–40)
WBC: 7.5 10*3/uL (ref 4.0–10.5)

## 2010-04-20 ENCOUNTER — Ambulatory Visit: Payer: Self-pay | Admitting: Internal Medicine

## 2010-04-30 ENCOUNTER — Ambulatory Visit: Payer: Self-pay | Admitting: Internal Medicine

## 2010-04-30 LAB — CONVERTED CEMR LAB: Pap Smear: NEGATIVE

## 2010-05-07 ENCOUNTER — Encounter: Payer: Self-pay | Admitting: Internal Medicine

## 2010-07-20 ENCOUNTER — Telehealth (INDEPENDENT_AMBULATORY_CARE_PROVIDER_SITE_OTHER): Payer: Self-pay | Admitting: Licensed Clinical Social Worker

## 2010-07-26 ENCOUNTER — Telehealth: Payer: Self-pay | Admitting: *Deleted

## 2010-07-26 ENCOUNTER — Emergency Department (HOSPITAL_COMMUNITY)
Admission: EM | Admit: 2010-07-26 | Discharge: 2010-07-26 | Payer: Self-pay | Source: Home / Self Care | Admitting: Emergency Medicine

## 2010-10-25 ENCOUNTER — Ambulatory Visit
Admission: RE | Admit: 2010-10-25 | Discharge: 2010-10-25 | Payer: Self-pay | Source: Home / Self Care | Attending: Internal Medicine | Admitting: Internal Medicine

## 2010-10-25 ENCOUNTER — Encounter: Payer: Self-pay | Admitting: Internal Medicine

## 2010-10-25 LAB — CONVERTED CEMR LAB
ALT: 12 units/L (ref 0–35)
AST: 12 units/L (ref 0–37)
Basophils Absolute: 0 10*3/uL (ref 0.0–0.1)
Basophils Relative: 1 % (ref 0–1)
CO2: 24 meq/L (ref 19–32)
Cholesterol: 172 mg/dL (ref 0–200)
Creatinine, Ser: 0.97 mg/dL (ref 0.40–1.20)
Eosinophils Absolute: 0.2 10*3/uL (ref 0.0–0.7)
Eosinophils Relative: 3 % (ref 0–5)
HCT: 36 % (ref 36.0–46.0)
HIV 1 RNA Quant: <20 copies/mL (ref <20)
HIV-1 RNA Quant, Log: <1.3 (ref <1.30)
MCHC: 32.2 g/dL (ref 30.0–36.0)
MCV: 85.3 fL (ref 78.0–100.0)
Neutrophils Relative %: 56 % (ref 43–77)
Platelets: 386 10*3/uL (ref 150–400)
RDW: 17.5 % — ABNORMAL HIGH (ref 11.5–15.5)
Sodium: 138 meq/L (ref 135–145)
Total Bilirubin: 0.3 mg/dL (ref 0.3–1.2)
Total Protein: 7.6 g/dL (ref 6.0–8.3)
Triglycerides: 99 mg/dL (ref <150)
VLDL: 20 mg/dL (ref 0–40)

## 2010-11-01 LAB — T-HELPER CELL (CD4) - (RCID CLINIC ONLY)
CD4 % Helper T Cell: 33 % (ref 33–55)
CD4 T Cell Abs: 740 uL (ref 400–2700)

## 2010-11-06 ENCOUNTER — Encounter: Payer: Self-pay | Admitting: *Deleted

## 2010-11-09 ENCOUNTER — Ambulatory Visit
Admission: RE | Admit: 2010-11-09 | Discharge: 2010-11-09 | Payer: Self-pay | Source: Home / Self Care | Attending: Internal Medicine | Admitting: Internal Medicine

## 2010-11-16 NOTE — Letter (Signed)
Summary: [employer]: FMLA  [employer]: FMLA   Imported By: Bonner Puna 12/25/2009 14:17:36  _____________________________________________________________________  External Attachment:    Type:   Image     Comment:   External Document

## 2010-11-16 NOTE — Progress Notes (Signed)
  Phone Note Call from Patient   Caller: Patient Call For: Michel Bickers MD Summary of Call: Patient called stating that she was laid off and that she applied for medicaid but could take up to 45 days to get approved. I was able to get the patient a 30 day sample of Atripla and told the patient if she wasnt approved to apply for ADAP or she could apply when she came to pick up samples. She stated that she would come to pick up samples tomorrow. Initial call taken by: Jarrett Ables CMA,  July 20, 2010 2:16 PM

## 2010-11-16 NOTE — Assessment & Plan Note (Signed)
Summary: pap appt. /dde   Vital Signs:  Patient profile:   45 year old female Menstrual status:  regular LMP:     04/17/2010  Vitals Entered By: Lorne Skeens RN (April 30, 2010 4:09 PM) CC: PAP smear visit.  Pt., given Condoms today.  Pt. given educational materials re:  HIV and women, BSE, nutrition, diet, exercise, and self-esteem. LMP (date): 04/17/2010 LMP - Character: normal     Menstrual Status regular Enter LMP: 04/17/2010 Last PAP Result WNL   Patient Instructions: 1)  It will take about a week for the results to return.  I will mail you them after that time. 2)  Thank you for coming to the Center today.    CC:  PAP smear visit.  Pt., given Condoms today.  Pt. given educational materials re:  HIV and women, BSE, nutrition, diet, exercise, and and self-esteem..   Evaluation and Follow-Up  Prevention For Positives: 04/30/2010   Safe sex practices discussed with patient. Condoms offered. Prior Medications: ATRIPLA 600-200-300 MG TABS (EFAVIRENZ-EMTRICITAB-TENOFOVIR) one at bedtime HYDROCHLOROTHIAZIDE 25 MG  TABS (HYDROCHLOROTHIAZIDE) Take 1 tablet by mouth once a day LEVOTHYROXINE SODIUM 75 MCG TABS (LEVOTHYROXINE SODIUM) Take 1 tablet by mouth once a day ARTIFICIAL TEARS  SOLN (ARTIFICIAL TEAR SOLUTION) 2 drops to each eye every 4 hours as needed VENTOLIN HFA 108 (90 BASE) MCG/ACT AERS (ALBUTEROL SULFATE) 2 puffs four times daily as needed IBUPROFEN 800 MG TABS (IBUPROFEN) Take 1 tablet by mouth as needed every 6 hours for foot pain. CLEOCIN-T 1 % LOTN (CLINDAMYCIN PHOSPHATE) Apply to lesions two times a day Current Allergies: No known allergies  Orders Added: 1)  T-PAP ([place]) [88142] 2)  Est. Patient Level I [id number]             Prevention For Positives: 04/30/2010   Safe sex practices discussed with patient. Condoms offered.

## 2010-11-16 NOTE — Assessment & Plan Note (Signed)
Summary: f/u/letter/gg   Vital Signs:  Patient profile:   45 year old female Menstrual status:  irregular Height:      65.5 inches (166.37 cm) Weight:      237.0 pounds (107.73 kg) BMI:     38.98 Temp:     97.0 degrees F (36.11 degrees C) oral Pulse rate:   73 / minute BP sitting:   166 / 100  (left arm) Cuff size:   large  Vitals Entered By: Nadine Counts Deborra Medina) (December 28, 2009 10:11 AM) Is Patient Diabetic? No Pain Assessment Patient in pain? no      Nutritional Status BMI of > 30 = obese  Have you ever been in a relationship where you felt threatened, hurt or afraid?No   Does patient need assistance? Functional Status Self care Ambulation Normal   Primary Care Provider:  Michel Bickers MD   History of Present Illness: 29 yold female with PMH as described in EMR is here today for a follow up appointmnet for her cough and ankle swelling. She also needs a work note. She is still smoking and wants to quit, she already has prescriptions of chantix but is scared to buy bacause of the fact it might cause suicidal ideation in somepeople as side effect. No other complaints.  Preventive Screening-Counseling & Management  Alcohol-Tobacco     Alcohol drinks/day: <1     Alcohol type: wine     Smoking Status: current     Smoking Cessation Counseling: yes     Smoke Cessation Stage: contemplative     Packs/Day: 0.5     Year Started: 1993  Problems Prior to Update: 1)  Ankle Pain, Left  (ICD-719.47) 2)  Uri  (ICD-465.9) 3)  Screening For Malignant Neoplasm of The Cervix  (ICD-V76.2) 4)  Abdominal Pain  (ICD-789.00) 5)  Grave's Disease  (ICD-242.00) 6)  Boils, Recurrent  (ICD-680.9) 7)  Tobacco User  (ICD-305.1) 8)  Class 1-exophthalmos-thyrotoxic  (ICD-376.21) 9)  Thyroid Nodule, Right  (ICD-241.0) 10)  Sarcoidosis  (ICD-135) 11)  Hyperthyroidism  (ICD-242.90) 12)  Asthma  (ICD-493.90) 13)  Hypertension  (ICD-401.9) 14)  HIV Disease  (ICD-042) 15)  Headache   (ICD-784.0) 16)  Gerd  (ICD-530.81) 17)  Depression  (ICD-311)  Medications Prior to Update: 1)  Atripla 600-200-300 Mg Tabs (Efavirenz-Emtricitab-Tenofovir) .... One At Bedtime 2)  Hydrochlorothiazide 25 Mg  Tabs (Hydrochlorothiazide) .... Take 1 Tablet By Mouth Once A Day 3)  Levothyroxine Sodium 75 Mcg Tabs (Levothyroxine Sodium) .... Take 1 Tablet By Mouth Once A Day 4)  Artificial Tears  Soln (Artificial Tear Solution) .... 2 Drops To Each Eye Every 4 Hours As Needed 5)  Ventolin Hfa 108 (90 Base) Mcg/act Aers (Albuterol Sulfate) .... 2 Puffs Four Times Daily As Needed 6)  Avelox Abc Pack 400 Mg Tabs (Moxifloxacin Hcl) .... Take 1 Tablet By Mouth Once A Day 7)  Ibuprofen 800 Mg Tabs (Ibuprofen) .... Take 1 Tablet By Mouth As Needed Every 6 Hours For Foot Pain.  Current Medications (verified): 1)  Atripla 600-200-300 Mg Tabs (Efavirenz-Emtricitab-Tenofovir) .... One At Bedtime 2)  Hydrochlorothiazide 25 Mg  Tabs (Hydrochlorothiazide) .... Take 1 Tablet By Mouth Once A Day 3)  Levothyroxine Sodium 75 Mcg Tabs (Levothyroxine Sodium) .... Take 1 Tablet By Mouth Once A Day 4)  Artificial Tears  Soln (Artificial Tear Solution) .... 2 Drops To Each Eye Every 4 Hours As Needed 5)  Ventolin Hfa 108 (90 Base) Mcg/act Aers (Albuterol Sulfate) .... 2 Puffs Four  Times Daily As Needed 6)  Avelox Abc Pack 400 Mg Tabs (Moxifloxacin Hcl) .... Take 1 Tablet By Mouth Once A Day 7)  Ibuprofen 800 Mg Tabs (Ibuprofen) .... Take 1 Tablet By Mouth As Needed Every 6 Hours For Foot Pain.  Allergies (verified): No Known Drug Allergies  Past History:  Past Medical History: Last updated: 07/10/2007 hx/o Anemia-Normocytic hx/o menometrorrhagia HIV disease-dx Mar 05   hx/o Thrush hx/o hidradenitis supprativa Hypertension Pneumonia, hx of Hyperthyroidism-Grave's Disease (dx Nov 07)-diffuse radiotracer uptake 9 Nov 07 Thyroid scan      Cold nodule to R lower lobe of thyroid Sarcoidosis-dx as a teenager in  [place] from abnl CXR. Completed 2 yrs Predisone after lung bx confirmation. No symptoms since then. 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 Neuro: Non focal, gait is normal Skin: no rashes noted.    Impression & Recommendations:  Problem # 1:  ANKLE PAIN, LEFT (ICD-719.47) Assessment Improved Ankle is much better now with no visible swelling. it is fully mobile and only some tenderness present on full flexion. I will recooment to continue brace and restricted mobility for another week.  Problem # 2:  URI (ICD-465.9) Assessment: Improved Patient still complains of some cough but it is much improved now. I recommended her steam inhalation, saline gargles and refrain from smoking for now. untill the cough is better. Her updated medication list for this problem includes:    Ibuprofen 800 Mg Tabs (Ibuprofen) .Marland Kitchen... Take 1  tablet by mouth as needed every 6 hours for foot pain.  Problem # 3:  TOBACCO USER (ICD-305.1)  Encouraged smoking cessation and discussed different methods for smoking cessation. She is not ready to quit now and also Dr [name] is already seeing her and working on it.  Complete Medication List: 1)  Atripla [id number] Mg Tabs (Efavirenz-emtricitab-tenofovir) .... One at bedtime 2)  Hydrochlorothiazide 25 Mg Tabs (Hydrochlorothiazide) .... Take 1 tablet by mouth once a day 3)  Levothyroxine Sodium 75 Mcg Tabs (Levothyroxine sodium) .... Take 1 tablet by mouth once a day 4)  Artificial Tears Soln (Artificial tear solution) .... 2 drops to each eye every 4 hours as needed 5)  Ventolin Hfa 108 (90 Base) Mcg/act Aers (Albuterol sulfate) .... 2 puffs four times daily as needed 6)  Avelox Abc Pack 400 Mg Tabs (Moxifloxacin hcl) .... Take 1 tablet by mouth once a day 7)  Ibuprofen 800 Mg Tabs (Ibuprofen) .... Take 1 tablet by mouth as needed every 6 hours for foot pain.  Patient Instructions: 1)  Please schedule a follow-up appointment in 6 months. 2)  Please schedule a follow-up appointment as needed. 3)  Tobacco is very bad for your health and your loved ones! You Should stop smoking!. 4)  Stop Smoking Tips: Choose a Quit date. Cut down before the Quit date. decide what you will do as a substitute when you feel the urge to smoke(gum,toothpick,exercise). 5)  It is important that you exercise regularly at least 20 minutes 5 times a week. If you develop chest pain, have severe difficulty breathing, or feel very tired , stop exercising immediately and seek medical attention. 6)  Get plenty of rest, drink lots of clear liquids, and use Tylenol or Ibuprofen for fever and comfort. Return in 7-10 days if you're not better:sooner if you're feeling worse.   Prevention & Chronic Care Immunizations   Influenza vaccine: Fluvax 3+  (08/17/2009)    Tetanus booster: Not documented    Pneumococcal vaccine:  Not documented  Other Screening   Pap smear: WNL  (12/11/2006)    Mammogram: Not documented   Smoking status: current  (12/28/2009)   Smoking cessation counseling: yes  (12/28/2009)  Lipids   Total Cholesterol: 160  (02/03/2009)   LDL: 99  (02/03/2009)   LDL Direct: Not documented   HDL: 39  (02/03/2009)   Triglycerides: 109  (02/03/2009)  Hypertension   Last Blood Pressure: 166 / 100  (12/28/2009)   Serum creatinine: 0.78  (12/21/2009)   Serum potassium 3.5  (12/21/2009)  Self-Management Support :   Personal Goals (by the next clinic visit) :      Personal blood pressure goal: 140/90  (12/28/2009)   Patient will work on the following items until the next clinic visit to reach self-care goals:     Medications and monitoring: take my medicines every day  (12/28/2009)     Eating: eat foods that are low in salt, eat baked foods instead of fried foods  (12/28/2009)     Activity: join a walking program  (12/28/2009)    Hypertension self-management support: Not documented

## 2010-11-16 NOTE — Letter (Signed)
Summary: [employer]: Physician Statement  [employer]: Physician Statement   Imported By: Bonner Puna 12/29/2009 09:25:01  _____________________________________________________________________  External Attachment:    Type:   Image     Comment:   External Document

## 2010-11-16 NOTE — Progress Notes (Signed)
Summary: ?URI  Phone Note Call from Patient   Caller: Patient Call For: Michel Bickers MD Summary of Call: Call from pt said that she has cold-like symptoms.  Had the flu 3 weeks aga.  Coughing up brownish-yellow phlegm.  Fevers 101.  Has been taking Tylenol.  No Nausea or vomiting.  Had had some diarrhea loose every hour.  Has been feeling lighted on Saturday-fell hurt her foot.  Fott is alao swollen.  Chills as well.  Pt given an appointment for this afternoon at 1:30 PM. Sander Nephew RN  December 21, 2009 9:42 AM  Initial call taken by: Sander Nephew RN,  December 21, 2009 9:42 AM  Follow-up for Phone Call        Thnk you for taking the call. I will see the aptient this afternoon.

## 2010-11-16 NOTE — Assessment & Plan Note (Signed)
Summary: ACUTE-COUGH/COLD/FEVER 101/COUGHING UP BROWN/YELLOW MUCUS/ID/...   Vital Signs:  Patient profile:   45 year old female Menstrual status:  irregular Height:      65.5 inches (166.37 cm) Weight:      240.8 pounds (109.45 kg) BMI:     39.60 O2 Sat:      97 % on Room air Temp:     97.7 degrees F (36.50 degrees C) Pulse rate:   76 / minute BP sitting:   153 / 90  (right arm) Cuff size:   large  Vitals Entered By: Nadine Counts Deborra Medina) (December 21, 2009 2:04 PM)  O2 Flow:  Room air CC: pt c/o URI sxz x 66mth Is Patient Diabetic? No  Have you ever been in a relationship where you felt threatened, hurt or afraid?No   Does patient need assistance? Functional Status Self care Ambulation Normal   Primary Care Provider:  Michel Bickers MD  CC:  pt c/o URI sxz x 57mth.  History of Present Illness: 45 year old women with PMH as described in EMR comes to the clinic today for cough, fever with chills for last 3 weeks. It started 3 weeks ago with cough, flu like symptoms along with diarrhea and vomiting which settled in couple of days but she started having fevers with chills 2 weeks ago which is still going on. The fevers are episodic and as high as 101F a/w chills. The cough is yellowin colour is everytime she coughs. No c/o chest pain or pain with deep breathing. She does have dyspnoea of exertion. She fell on saturday as she was feeling dizzy and hit her left ankle which is swollen and painful. No other complaints.  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 One At Bedtime 2)  Hydrochlorothiazide 25 Mg  Tabs (Hydrochlorothiazide) .... Take 1 Tablet By Mouth Once A Day 3)  Levothyroxine Sodium 75 Mcg Tabs (Levothyroxine Sodium) .... Take 1 Tablet By Mouth Once A Day 4)  Artificial Tears  Soln (Artificial Tear Solution) .... 2 Drops To Each Eye Every 4 Hours As Needed 5)  Ventolin Hfa 108 (90 Base) Mcg/act Aers (Albuterol Sulfate) .... 2 Puffs Four Times Daily As Needed  Current Medications (verified): 1)  Atripla 600-200-300 Mg Tabs (Efavirenz-Emtricitab-Tenofovir) .... One At Bedtime 2)  Hydrochlorothiazide 25 Mg  Tabs (Hydrochlorothiazide) .... Take 1 Tablet By Mouth Once A Day 3)  Levothyroxine Sodium 75 Mcg Tabs (Levothyroxine Sodium) .... Take 1 Tablet By Mouth Once A Day 4)  Artificial Tears  Soln (Artificial Tear Solution) .... 2 Drops To Each Eye Every 4  Hours As Needed 5)  Ventolin Hfa 108 (90 Base) Mcg/act Aers (Albuterol Sulfate) .... 2 Puffs Four Times Daily As Needed 6)  Avelox Abc Pack 400 Mg Tabs (Moxifloxacin Hcl) .... Take 1 Tablet By Mouth Once A Day 7)  Ibuprofen 800 Mg Tabs (Ibuprofen) .... Take 1 Tablet By Mouth As Needed Every 6 Hours For Foot Pain.  Allergies (verified): No Known Drug Allergies  Past History:  Past Medical History: Last updated: 07/10/2007 hx/o Anemia-Normocytic hx/o menometrorrhagia HIV disease-dx Mar 05   hx/o Thrush hx/o hidradenitis supprativa Hypertension Pneumonia, hx of Hyperthyroidism-Grave's Disease (dx Nov 07)-diffuse radiotracer uptake 9 Nov 07  Thyroid scan      Cold nodule to R lower lobe of thyroid Sarcoidosis-dx as a teenager in [place] from abnl CXR. Completed 2 yrs Predisone after lung bx confirmation. No symptoms since then. 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 Current Smoker-1ppd since age 62  Risk Factors: Alcohol Use: <1 (12/21/2009) Caffeine Use: yes (08/17/2009) Exercise: no (08/17/2009)  Risk Factors: Smoking Status: current (12/21/2009) Packs/Day: 0.5 (12/21/2009)  Review of Systems      See HPI  Physical Exam  Additional Exam:  Gen: AOx3, in no acute distress Eyes: PERRL, EOMI ENT:MMM, No erythema noted in posterior pharynx Neck: No JVD, No LAP Chest: CTAB with  good respiratory effort CVS: regular rhythmic rate, NO M/R/G, S1 S2 normal Abdo: soft,ND, BS+x4, Non tender and No hepatosplenomegaly EXT: No odema noted, left ankle is swollen and tender on palpation, no signs of inflammation Neuro: Non focal, gait is normal Skin: no rashes noted.    Impression & Recommendations:  Problem # 1:  URI (ICD-465.9) Assessment New The chest xay is normal and the basic labs are also not s/o any overwhelming infection. I will still treat her for bronchitis in view of her fever and chills. Avelox for 5 days. Conservative management with Steam inhalation and saline gargles. Her updated medication list for this problem includes:    Ibuprofen 800 Mg Tabs (Ibuprofen) .Marland Kitchen... Take 1 tablet by mouth as needed every 6 hours for foot  pain.  Orders: T-Comprehensive Metabolic Panel ([id number]) T-CBC w/Diff [id number]) CXR- 2view (CXR)  Problem # 2:  ANKLE PAIN, LEFT (ICD-719.47) Assessment: New the patient is in acute pain due to her left ankle. I will treat conservatively with ankle brace prescribed by the ortho tech(called from floor for the service). I will also prescribe her Ibuprofen 800mg  for symptomatic relief. I will see her back asneeded if the pain ans swelling persists and have her do the Xray ankle for evaluation for a possible fracture. The suspicion for fracture is low as there is no obvious deformity. Conservative management with volterin gel and warm compresses were discussed.  Problem # 3:  TOBACCO USER (ICD-305.1)  Encouraged smoking cessation and discussed different methods for smoking cessation. Not ready to quit.  Problem # 4:  HYPERTENSION (ICD-401.9) Patient is in pain due to ankle swelling and I will not make any changes to her BPmeds tofay. K and Crt is great. Her updated medication list for this problem includes:    Hydrochlorothiazide 25 Mg Tabs (  Hydrochlorothiazide) .Marland Kitchen... Take 1 tablet by mouth once a day  BP today: 153/90 Prior BP: 131/87 (08/17/2009)  Labs Reviewed: K+: 3.8 (02/03/2009) Creat: : 0.86 (02/03/2009)   Chol: 160 (02/03/2009)   HDL: 39 (02/03/2009)   LDL: 99 (02/03/2009)   TG: 109 (02/03/2009)  Problem # 5:  HIV DISEASE (ICD-042) Well controlled and followed by Dr [name].  Problem # 6:  BOILS, RECURRENT (ICD-680.9) Patient ahs history of recurrent boils and still has a couple of them.I think Avelox is going to take care of her boils at this point of time. They are not big enough to require drainage.  Complete Medication List: 1)  Atripla [id number] Mg Tabs (Efavirenz-emtricitab-tenofovir) .... One at bedtime 2)  Hydrochlorothiazide 25 Mg Tabs (Hydrochlorothiazide) .... Take 1 tablet by mouth once a day 3)  Levothyroxine Sodium 75 Mcg Tabs (Levothyroxine sodium)  .... Take 1 tablet by mouth once a day 4)  Artificial Tears Soln (Artificial tear solution) .... 2 drops to each eye every 4 hours as needed 5)  Ventolin Hfa 108 (90 Base) Mcg/act Aers (Albuterol sulfate) .... 2 puffs four times daily as needed 6)  Avelox Abc Pack 400 Mg Tabs (Moxifloxacin hcl) .... Take 1 tablet by mouth once a day 7)  Ibuprofen 800 Mg Tabs (Ibuprofen) .... Take 1 tablet by mouth as needed every 6 hours for foot pain.  Patient Instructions: 1)  Tobacco is very bad for your health and your loved ones! You Should stop smoking!. 2)  Stop Smoking Tips: Choose a Quit date. Cut down before the Quit date. decide what you will do as a substitute when you feel the urge to smoke(gum,toothpick,exercise). 3)  It is important that you exercise regularly at least 20 minutes 5 times a week. If you develop chest pain, have severe difficulty breathing, or feel very tired , stop exercising immediately and seek medical attention. 4)  You need to lose weight. Consider a lower calorie diet and regular exercise.  5)  Check your Blood Pressure regularly. If it is above: you should make an appointment. 6)  Get plenty of rest, drink lots of clear liquids, and use Tylenol or Ibuprofen for fever and comfort. Return in 7-10 days if you're not better:sooner if you're feeling worse. 7)  Take your antibiotic as prescribed until ALL of it is gone, but stop if you develop a rash or swelling and contact our office as soon as possible. 8)  Recommended remaining out of work for  9)  Please schedule a follow-up appointment in 6 months. 10)  Please schedule a follow-up appointment as needed. Prescriptions: IBUPROFEN 800 MG TABS (IBUPROFEN) Take 1 tablet by mouth as needed every 6 hours for foot pain.  #20 x 0   Entered and Authorized by:   Janell Quiet MD   Signed by:   Janell Quiet MD on 12/21/2009   Method used:   Electronically to        [facility]. [id number]* (retail)       [address]       [place], [place]  [address]       Ph: [phone] or [phone]       Fax: [phone]   RxID:   [id number] AVELOX ABC PACK 400 MG TABS (MOXIFLOXACIN HCL) Take 1 tablet by mouth once a day  #5 x 0   Entered and Authorized by:   Janell Quiet MD   Signed by:  Janell Quiet MD on 12/21/2009   Method used:   Electronically to        [facility]. [id number]* (retail)       [address].       [place], [place]  [address]       Ph: [phone] or [phone]       Fax: [phone]   RxID:   [id number]  Process Orders Check Orders Results:     [facility]: ABN not required for this insurance Tests Sent for requisitioning (December 21, 2009 3:41 PM):     12/21/2009: [facility] -- T-Comprehensive Metabolic Panel [id number] (signed)     12/21/2009: [facility] -- T-CBC w/Diff [id number] (signed)    Prevention & Chronic Care Immunizations   Influenza vaccine: Fluvax 3+  (08/17/2009)    Tetanus booster: Not documented    Pneumococcal vaccine: Not documented  Other Screening   Pap smear: WNL  (12/11/2006)    Mammogram: Not documented   Smoking status: current  (12/21/2009)   Smoking cessation counseling: yes  (12/21/2009)  Lipids   Total Cholesterol: 160  (02/03/2009)   LDL: 99  (02/03/2009)   LDL Direct: Not documented   HDL: 39  (02/03/2009)   Triglycerides: 109  (02/03/2009)  Hypertension   Last Blood Pressure: 153 / 90  (12/21/2009)   Serum creatinine: 0.86  (02/03/2009)   Serum potassium 3.8  (02/03/2009) CMP ordered   Self-Management Support :    Hypertension self-management support: Not documented

## 2010-11-16 NOTE — Letter (Signed)
Summary: Out of Work  [facility]  [address]   [place], [place] [address]   Phone: [phone]  Fax: [phone]    December 21, 2009   Employee:  [name]    To Whom It May Concern:   For Medical reasons, please excuse the above named employee from work for the following dates:  Start:   12/21/09  End:   12/25/09  If you need additional information, please feel free to contact our office.         Sincerely,    Janell Quiet MD

## 2010-11-16 NOTE — Progress Notes (Signed)
Summary: out of work/ hla  Phone Note Call from Patient   Summary of Call: pt needs a revised out of work note stating she can return to work [employer] 12/28/2009, could you do this? fax to [phone], attn fontella wright also please note how long pt will be on activity restrictions, how long she will wear boot.  Initial call taken by: Freddy Finner RN,  December 24, 2009 12:29 PM  Follow-up for Phone Call        Please set up an appointment on monday for follow up and I will review her leg and write a work note accordingly. Follow-up by: Janell Quiet MD,  December 25, 2009 1:54 PM  Additional Follow-up for Phone Call Additional follow up Details #1::        Pt called again needing a letter written.  I told her what Dr Cathren Laine said and made appointment for monday for f/u.  I called her employer to explain pt would bring in note after Monday's OV.  Employer called and pt is excused from work until note from Dr Cathren Laine received. Gevena Cotton RN  December 25, 2009 2:15 PM  Additional Follow-up by: Gevena Cotton RN,  December 25, 2009 12:38 PM

## 2010-11-16 NOTE — Letter (Signed)
Summary: Results Follow-up Letter  [facility]  [address] [place]   [place], [place] [address]   Phone: [phone]  Fax: [phone]           May 07, 2010  [address] [place], [place]  [address]  Dear [name],   The following are the results of your recent test(s):  Test     Result     Pap Smear    Normal_XXX___  Not Normal_____  Comments:  Everything was normal.  I will see you in one year for your next PAP smear.  Thank you for coming to the Center for your care.  Sincerely,    [facility]

## 2010-11-16 NOTE — Assessment & Plan Note (Signed)
Summary: F/U OV/VS   Primary Provider:  Michel Bickers MD  CC:  follow-up visit, boils under right breast x 5-6 months, "open" for several months, and has not seen antyone about them. .  History of Present Illness: [name] is in for her routine visit.  She continues all her same medications and says she has notsingle-dose of her Atripla. She continues to smoke half a pack of cigarettes daily.  Several months ago she did try to quit cold [place] and was able to stop for two weeks.  Currently she says that she does not have a very strong desire to quit and has no current plan to try to quit.  She said she did start an exercise program walking with her daughter but gave up on it after 3 days because her joints hurt.  She did not attend the lifestyle class and is not following any particular diet.  She says that she eats what she likes.  She continues to be bothered by to raw areas under her right breast that have been present for the last 6 months.  Preventive Screening-Counseling & Management  Alcohol-Tobacco     Alcohol drinks/day: <1     Alcohol type: wine     Smoking Status: current     Smoking Cessation Counseling: yes     Smoke Cessation Stage: contemplative     Packs/Day: 0.5     Year Started: 1993  Caffeine-Diet-Exercise     Caffeine use/day: yes     Does Patient Exercise: no  Hep-HIV-STD-Contraception     HIV Risk: no risk noted     HIV Risk Counseling: 05/02/2006  Safety-Violence-Falls     Seat Belt Use: yes  Comments: declined condonms      Sexual History:  n/a.        Drug Use:  never.     Prior Medication List:  ATRIPLA 600-200-300 MG TABS (EFAVIRENZ-EMTRICITAB-TENOFOVIR) one at bedtime HYDROCHLOROTHIAZIDE 25 MG  TABS (HYDROCHLOROTHIAZIDE) Take 1 tablet by mouth once a day LEVOTHYROXINE SODIUM 75 MCG TABS (LEVOTHYROXINE SODIUM) Take 1 tablet by mouth once a day     ARTIFICIAL TEARS  SOLN (ARTIFICIAL TEAR SOLUTION) 2 drops to each eye every 4 hours as needed VENTOLIN  HFA 108 (90 BASE) MCG/ACT AERS (ALBUTEROL SULFATE) 2 puffs four times daily as needed AVELOX ABC PACK 400 MG TABS (MOXIFLOXACIN HCL) Take 1 tablet by mouth once a day IBUPROFEN 800 MG TABS (IBUPROFEN) Take 1 tablet by mouth as needed every 6 hours for foot pain.   Current Allergies (reviewed today): No known allergies  Social History: Drug Use:  never  Vital Signs:  Patient profile:   45 year old female Menstrual status:  irregular LMP:     04/16/2010 Height:      65.5 inches (166.37 cm) Weight:      236.75 pounds (107.61 kg) BMI:     38.94 Temp:     98.2 degrees F (36.78 degrees C) oral Pulse rate:   74 / minute BP sitting:   163 / 97  (left arm) Cuff size:   large  Vitals Entered By: Lorne Skeens RN (April 20, 2010 9:17 AM) CC: follow-up visit, boils under right breast x 5-6 months, "open" for several months, has not seen antyone about them.  Is Patient Diabetic? No Pain Assessment Patient in pain? yes     Location: head Intensity: 2 Type: headache Onset of pain  this AM  Have you ever been in a relationship where you felt  threatened, hurt or afraid?No   Does patient need assistance? Functional Status Self care Ambulation Normal Comments no missed doses of rxes LMP (date): 04/16/2010     Enter LMP: 04/16/2010 Last PAP Result WNL   Physical Exam  General:  alert and overweight-appearing.   Mouth:  good dentition, pharynx pink and moist, no erythema, and no exudates.   Lungs:  normal breath sounds.  no crackles and no wheezes.   Heart:  normal rate, regular rhythm, and no murmur.   Abdomen:  soft, non-tender, and normal bowel sounds.   Skin:  she has two skin lesions under her right breast at the bra line.  They are both about 8 mm in diameter and are slightly raised.  The lesions are denuded centrally with no expressible drainage.  She has old healed scars under her breasts axilla and groin.        Medication Adherence: 04/20/2010   Adherence to  medications reviewed with patient. Counseling to provide adequate adherence provided                                Impression & Recommendations:  Problem # 1:  HIV DISEASE (ICD-042) Her HIV infection remains under excellent control.  I will continue her current regimen. The following medications were removed from the medication list:    Avelox Abc Pack 400 Mg Tabs (Moxifloxacin hcl) .Marland Kitchen... Take 1 tablet by mouth once a day  Diagnostics Reviewed:  CD4: 1030 (04/08/2010)   WBC: 7.5 (04/06/2010)   Hgb: 10.4 (04/06/2010)   HCT: 33.2 (04/06/2010)   Platelets: 376 (04/06/2010) HIV-1 RNA: <48 copies/mL (04/06/2010)   HBSAg: NO (12/11/2006)  Problem # 2:  BOILS, RECURRENT (ICD-680.9) she may have hidradenitis.  I will try topical clindamycin to see if we can heal her right breast lesions. Orders: Est. Patient Level IV [id number]) T-Culture, Wound ([id number])  Problem # 3:  HYPERTENSION (ICD-401.9) Her blood pressure remains elevated.  I've encouraged her to attend the lifestyle class and try to limit salt and caloric intake as well as to continue an exercise program. Her updated medication list for this problem includes:    Hydrochlorothiazide 25 Mg Tabs (Hydrochlorothiazide) .Marland Kitchen... Take 1 tablet by mouth once a day  Orders: Est. Patient Level IV ([id number])  BP today: 163/97 Prior BP: 166/100 (12/28/2009)  Labs Reviewed: K+: 3.9 (04/06/2010) Creat: : 0.85 (04/06/2010)   Chol: 150 (04/06/2010)   HDL: 37 (04/06/2010)   LDL: 87 (04/06/2010)   TG: 130 (04/06/2010)  Problem # 4:  TOBACCO USER (ICD-305.1) I have encouraged her to call the 1-800 quit line and developed a plan to quit smoking. Orders: Est. Patient Level IV [id number])  Medications Added to Medication List This Visit: 1)  Cleocin-t 1 % Lotn (Clindamycin phosphate) .... Apply to lesions two times a day  Other Orders: Pneumococcal Vaccine [id number]) Admin 1st Vaccine [phone]) Admin 1st Vaccine [facility]) [phone]) Future  Orders: T-CD4SP ([facility]) (CD4SP) ... 10/17/2010 T-HIV Viral Load [phone]) ... 10/17/2010 T-Comprehensive Metabolic Panel ([id number]) ... 10/17/2010 T-CBC w/Diff [id number]) ... 10/17/2010 T-RPR (Syphilis) [phone]) ... 10/17/2010 T-Lipid Profile [phone]) ... 10/17/2010  Patient Instructions: 1)  Please schedule a follow-up appointment in 6 months.  Prescriptions: VENTOLIN HFA 108 (90 BASE) MCG/ACT AERS (ALBUTEROL SULFATE) 2 puffs four times daily as needed  #1 x 3   Entered and Authorized by:   Michel Bickers MD   Signed by:   Michel Bickers MD on  04/20/2010   Method used:   Electronically to        [facility]. [id number]* (retail)       [address]       [place], [place]  [address]       Ph: [phone] or [phone]       Fax: [phone]   RxID:   [id number] CLEOCIN-T 1 % LOTN (CLINDAMYCIN PHOSPHATE) Apply to lesions two times a day  #1 bottle x 3   Entered and Authorized by:   Michel Bickers MD   Signed by:   Michel Bickers MD on 04/20/2010   Method used:   Electronically to        [facility]. [id number]* (retail)       [address].       [place], [place]  [address]       Ph: [phone] or [phone]       Fax: [phone]   RxID:   [id number]         Medication Adherence: 04/20/2010   Adherence to medications reviewed with patient. Counseling to provide adequate adherence provided                                Pneumovax Vaccine    Vaccine Type: Pneumovax    Site: right deltoid    Mfr: Merck    Dose: 0.5 ml    Route: IM    Given by: Lorne Skeens RN    Exp. Date: 10/16/2011    Lot #: [id number]

## 2010-11-16 NOTE — Progress Notes (Signed)
Summary: recurrent boils  Phone Note Call from Patient Call back at Home Phone [phone]   Caller: Patient Call For: Michel Bickers MD Reason for Call: Acute Illness Summary of Call: Boils under right breast present since Thanksgiving.  Another one appeared under left breast and on abdomen this past weekend.  Requesting antibiotic.  RN recommended appt. due to how long they have been present and then the new areas.  Pt. to call back to let clinic know preference for this afternoon or tomorrow. Lorne Skeens RN  November 17, 2009 12:23 PM

## 2010-11-16 NOTE — Progress Notes (Signed)
Summary: rash/ hla  Phone Note Call from Patient   Summary of Call: pt calls stating she was told by cid to call int med for what she thinks is chicken pox, she is referred to urg care if she feels the need to be seen today, denies fever, answers 'several days" when ask how long she has had this "rash" states several times "it itches real bad" . it is explained that there are no available slots until fri and is ask to go to urg care, she hangs up. Initial call taken by: Freddy Finner RN,  July 26, 2010 9:43 AM  Follow-up for Phone Call        Noted. Follow-up by: Janell Quiet MD,  July 26, 2010 4:10 PM

## 2010-11-18 NOTE — Assessment & Plan Note (Signed)
Summary: 45MONTH F/U/VS   Primary Provider:  Michel Bickers MD  CC:  follow-up visit.  History of Present Illness: [name] is in for her routine visit.  Unfortunately she lost her job several months ago and has been unable to find employment.  She lost her health insurance and was out of her Atripla for 3 days in a row before she was able to get her Medicaid reinstated.  She has not missed any doses since October.  She is still smoking about a half pack of cigarettes daily but wants to quit.  She has been taking her blood pressure medication faithfully.  She says that she has been sleeping excessively and does not feel like doing her usual activities.  She admits to feeling somewhat depressed, similar to how she felt when she was first diagnosed with HIV.  She has had no thoughts of hurting herself.  Preventive Screening-Counseling & Management  Alcohol-Tobacco     Alcohol drinks/day: <1     Alcohol type: wine     Smoking Status: current     Smoking Cessation Counseling: yes     Smoke Cessation Stage: contemplative     Packs/Day: 0.5     Year Started: 1993  Caffeine-Diet-Exercise     Caffeine use/day: yes     Does Patient Exercise: no  Hep-HIV-STD-Contraception     HIV Risk: no risk noted     HIV Risk Counseling: not indicated-no HIV risk noted  Safety-Violence-Falls     Seat Belt Use: yes  Comments: declined condoms      Sexual History:  n/a.        Drug Use:  former.     Updated Prior Medication List: ATRIPLA 600-200-300 MG TABS (EFAVIRENZ-EMTRICITAB-TENOFOVIR) one at bedtime HYDROCHLOROTHIAZIDE 25 MG  TABS (HYDROCHLOROTHIAZIDE) Take 1 tablet by mouth once a day LEVOTHYROXINE SODIUM 75 MCG TABS (LEVOTHYROXINE SODIUM) Take 1 tablet by mouth once a day     ARTIFICIAL TEARS  SOLN (ARTIFICIAL TEAR SOLUTION) 2 drops to each eye every 4 hours as needed VENTOLIN HFA 108 (90 BASE) MCG/ACT AERS (ALBUTEROL SULFATE) 2 puffs four times daily as needed IBUPROFEN 800 MG TABS (IBUPROFEN)  Take 1 tablet by mouth as needed every 6 hours for foot pain.  Current Allergies (reviewed today): No known allergies  Social History: Drug Use:  former  Vital Signs:  Patient profile:   45 year old female Menstrual status:  regular Height:      65.5 inches (166.37 cm) Weight:      236 pounds (107.27 kg) BMI:     38.81 Temp:     98.4 degrees F (36.89 degrees C) oral Pulse rate:   83 / minute BP sitting:   166 / 101  (left arm) Cuff size:   large  Vitals Entered By: Elige Radon RN (November 09, 2010 9:02 AM) CC: follow-up visit Is Patient Diabetic? No Pain Assessment Patient in pain? no      Nutritional Status BMI of > 30 = obese Nutritional Status Detail appetite is "great"  Have you ever been in a relationship where you felt threatened, hurt or afraid?No   Does patient need assistance? Functional Status Self care Ambulation Normal Comments missed 3 doses of meds   Physical Exam  General:  alert and overweight-appearing.   Mouth:  good dentition, pharynx pink and moist, no erythema, and no exudates.   Lungs:  normal breath sounds.  no crackles and no wheezes.   Heart:  normal rate, regular rhythm, and no  murmur.   Abdomen:  soft, non-tender, and normal bowel sounds.   Skin:  no rashes.   Psych:  normally interactive, good eye contact, not anxious appearing, and not depressed appearing.          Medication Adherence: 11/09/2010   Adherence to medications reviewed with patient. Counseling to provide adequate adherence provided                                Impression & Recommendations:  Problem # 1:  HIV DISEASE (ICD-042) I will continue her current regimen. Diagnostics Reviewed:  HIV: CDC-defined AIDS (04/30/2010)   CD4: 740 (10/26/2010)   WBC: 6.7 (10/25/2010)   Hgb: 11.6 (10/25/2010)   HCT: 36.0 (10/25/2010)   Platelets: 386 (10/25/2010) HIV-1 RNA: <20 copies/mL (10/25/2010)   HBSAg: NO (12/11/2006)  Problem # 2:  HYPERTENSION  (ICD-401.9) Her blood pressure is not at goal.  I will add an ACE inhibitor and see her back in one month for blood work and repeat blood pressure check. Her updated medication list for this problem includes:    Lisinopril-hydrochlorothiazide 20-25 Mg Tabs (Lisinopril-hydrochlorothiazide) .Marland Kitchen... Take 1 tablet by mouth once a day  Orders: Est. Patient Level IV [id number]) Misc. Referral (Misc. Ref)  Problem # 3:  TOBACCO USER (ICD-305.1) I have given her written information on the outline encouraged her to establish a plan to quit. Orders: Est. Patient Level IV [id number]) Misc. Referral (Misc. Ref)  Problem # 4:  DEPRESSION (ICD-311) I will refer her to [facility] for mental health counseling. Orders: Est. Patient Level IV [id number]) Misc. Referral (Misc. Ref)  Medications Added to Medication List This Visit: 1)  Lisinopril-hydrochlorothiazide 20-25 Mg Tabs (Lisinopril-hydrochlorothiazide) .... Take 1 tablet by mouth once a day  Patient Instructions: 1)  Please schedule a follow-up appointment in 1 month.  Prescriptions: LISINOPRIL-HYDROCHLOROTHIAZIDE 20-25 MG TABS (LISINOPRIL-HYDROCHLOROTHIAZIDE) Take 1 tablet by mouth once a day  #30 x 11   Entered and Authorized by:   Michel Bickers MD   Signed by:   Michel Bickers MD on 11/09/2010   Method used:   Print then Give to Patient   RxID:   [id number]   Appended Document: Immunization Entry      Influenza Vaccine    Vaccine Type: Fluvax 3+    Site: left deltoid    Mfr: GlaxoSmithKline    Dose: 0.5 ml    Route: IM    Given by: Elige Radon RN    Exp. Date: 04/16/2011    Lot #: [id number]    VIS given: 05/11/10 version given November 09, 2010.  Flu Vaccine Consent Questions    Do you have a history of severe allergic reactions to this vaccine? no    Any prior history of allergic reactions to egg and/or gelatin? no    Do you have a sensitivity to the preservative Thimersol? no    Do you have a past history of Guillan-Barre  Syndrome? no    Do you currently have an acute febrile illness? no    Have you ever had a severe reaction to latex? no    Vaccine information given and explained to patient? yes    Are you currently pregnant? no

## 2010-11-29 ENCOUNTER — Telehealth: Payer: Self-pay | Admitting: Internal Medicine

## 2010-12-08 NOTE — Progress Notes (Signed)
  Phone Note Call from Patient   Caller: Patient Summary of Call: states she needs the letter of 01/15/08 to give to her attourney. I asked her to stop by & sign a ROI so we can give it to her or fax to her lawyer. she agreed Initial call taken by: Elige Radon RN,  November 29, 2010 2:47 PM

## 2011-01-02 LAB — T-HELPER CELL (CD4) - (RCID CLINIC ONLY): CD4 T Cell Abs: 1030 uL (ref 400–2700)

## 2011-01-20 LAB — T-HELPER CELL (CD4) - (RCID CLINIC ONLY): CD4 T Cell Abs: 990 uL (ref 400–2700)

## 2011-02-10 ENCOUNTER — Telehealth: Payer: Self-pay | Admitting: Licensed Clinical Social Worker

## 2011-02-10 NOTE — Telephone Encounter (Signed)
Patient complaining of persistant cough since starting lisinipril-hctz. Was advised to stop bp medication and I will send a message to Dr. [name] so he could prescribe a different medication.

## 2011-02-11 ENCOUNTER — Telehealth: Payer: Self-pay | Admitting: *Deleted

## 2011-02-11 NOTE — Telephone Encounter (Signed)
Pt called to report "coughing" which increased over time since starting lisinopril/HCTZ combination in January, 2012.  Friend looked up possible side effects of this medicine on the Internet and found that coughing was a side effect.  The pt was instructed yesterday after calling the Center to stop taking the tablet.  Pt. Returned call today to find out what she should do next.  RN made appt for pt to see Dr. Sherri Sear 02/14/11 to discuss this problem. Lorne Skeens, RN.

## 2011-02-14 ENCOUNTER — Ambulatory Visit (INDEPENDENT_AMBULATORY_CARE_PROVIDER_SITE_OTHER): Payer: Medicaid Other | Admitting: Internal Medicine

## 2011-02-14 ENCOUNTER — Encounter: Payer: Self-pay | Admitting: Internal Medicine

## 2011-02-14 ENCOUNTER — Other Ambulatory Visit: Payer: Self-pay | Admitting: *Deleted

## 2011-02-14 VITALS — BP 161/110 | HR 80 | Temp 97.8°F | Ht 65.0 in | Wt 246.8 lb

## 2011-02-14 DIAGNOSIS — J302 Other seasonal allergic rhinitis: Secondary | ICD-10-CM

## 2011-02-14 DIAGNOSIS — E039 Hypothyroidism, unspecified: Secondary | ICD-10-CM

## 2011-02-14 DIAGNOSIS — I10 Essential (primary) hypertension: Secondary | ICD-10-CM

## 2011-02-14 DIAGNOSIS — J309 Allergic rhinitis, unspecified: Secondary | ICD-10-CM

## 2011-02-14 DIAGNOSIS — B20 Human immunodeficiency virus [HIV] disease: Secondary | ICD-10-CM

## 2011-02-14 MED ORDER — LEVOTHYROXINE SODIUM 75 MCG PO TABS: 75.0000 ug | ORAL_TABLET | Freq: Every day | ORAL | Status: DC

## 2011-02-14 MED ORDER — HYDROCHLOROTHIAZIDE 12.5 MG PO CAPS: 12.5000 mg | ORAL_CAPSULE | Freq: Every day | ORAL | Status: DC

## 2011-02-14 MED ORDER — LORATADINE 10 MG PO TABS: 10.0000 mg | ORAL_TABLET | Freq: Every day | ORAL | Status: DC

## 2011-02-14 MED ORDER — NIFEDIPINE ER OSMOTIC RELEASE 30 MG PO TB24: 30.0000 mg | ORAL_TABLET | Freq: Every day | ORAL | Status: DC

## 2011-02-14 NOTE — Assessment & Plan Note (Addendum)
I will stop lisinopril and add nifedipine to HCTZ.

## 2011-02-14 NOTE — Assessment & Plan Note (Signed)
I will check labs today and continue Atripla.

## 2011-02-14 NOTE — Progress Notes (Signed)
  Subjective:    Patient ID: [name], female    DOB: Oct 27, 1965, 45 y.o.   MRN: [id number]  HPI Conley developed a dry, persistent cough shortly after starting lisinopril after her last visit. She has not had any fever or SOB. Her BP was well controlled until she stopped her BP pill 4 days ago. He cough is improving. She has not missed any Atripla. She has Medicaid but cannot find an endocrinologist and has been out of Synthroid.     Review of Systems     Objective:   Physical Exam  Constitutional: She appears well-developed and well-nourished. No distress.  HENT:  Mouth/Throat: Oropharynx is clear and moist. No oropharyngeal exudate.  Cardiovascular: Normal rate, regular rhythm and normal heart sounds.   No murmur heard. Pulmonary/Chest: Breath sounds normal. She has no wheezes. She has no rales.  Psychiatric: She has a normal mood and affect.          Assessment & Plan:

## 2011-02-15 LAB — T-HELPER CELL (CD4) - (RCID CLINIC ONLY)
CD4 % Helper T Cell: 33 % (ref 33–55)
CD4 T Cell Abs: 1110 uL (ref 400–2700)

## 2011-02-16 LAB — HIV-1 RNA QUANT-NO REFLEX-BLD
HIV 1 RNA Quant: <20 copies/mL (ref <20)
HIV-1 RNA Quant, Log: <1.3 {Log} (ref <1.30)

## 2011-02-25 ENCOUNTER — Other Ambulatory Visit: Payer: Self-pay | Admitting: Internal Medicine

## 2011-02-25 DIAGNOSIS — B20 Human immunodeficiency virus [HIV] disease: Secondary | ICD-10-CM

## 2011-03-01 ENCOUNTER — Ambulatory Visit (INDEPENDENT_AMBULATORY_CARE_PROVIDER_SITE_OTHER): Payer: Medicaid Other | Admitting: Internal Medicine

## 2011-03-01 VITALS — BP 167/110 | HR 79 | Temp 98.1°F | Ht 65.0 in | Wt 241.0 lb

## 2011-03-01 DIAGNOSIS — I10 Essential (primary) hypertension: Secondary | ICD-10-CM

## 2011-03-01 DIAGNOSIS — B20 Human immunodeficiency virus [HIV] disease: Secondary | ICD-10-CM

## 2011-03-01 DIAGNOSIS — F17200 Nicotine dependence, unspecified, uncomplicated: Secondary | ICD-10-CM

## 2011-03-01 MED ORDER — NIFEDIPINE 60 MG (OSM) PO TB24: 60.0000 mg | ORAL_TABLET | Freq: Every day | ORAL | Status: DC

## 2011-03-01 NOTE — Op Note (Signed)
[name], [name]                ACCOUNT NO.:  [id number]   MEDICAL RECORD NO.:  [id number]          PATIENT TYPE:  AMB   LOCATION:  DAY                          FACILITY:  [facility]   PHYSICIAN:  Kathrin Penner, M.D.   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   PROCEDURE:  Total thyroidectomy.   SURGEON:  Kathrin Penner, M.D.   ASSISTANT:  Orson Ape. Rise Patience, M.D.   ANESTHESIA:  General.   NOTE:  [name] is a 45 year old young woman who developed  Graves disease with severe weight loss.  She was first placed on PTU  with some failed PTU administration. She subsequently had been on  methimazole with somewhat more adequate response.  On the most recent  radionuclide scan, however, she was noted to have a photopenic nodule in  the right lower pole which on biopsy shows a follicular nodule.  The  patient comes to the operating room now for total thyroidectomy after  risks and potential benefits of surgery been fully discussed, all  questions answered, consent for surgery obtained.  The patient  understands the additional risk of permanent hypoparathyroidism,  recurrent laryngeal nerve injury and the possibility of recurrence of  her Graves disease if sufficient thyroid tissue was not removed.   PROCEDURE:  The patient is positioned supinely with the arms tucked the  sides.  The head and neck are hyperextended and the neck was then  prepped and draped to be included in a sterile operative field.  Positive identification of the patient as [name] and procedure to  be done as total thyroidectomy carried out.  All members of the  operating room team present and verified this information.   I made a  transverse collar incision extending from the  sternocleidomastoid muscle on the right to the anterior border of  sternocleidomastoid muscle on the left, deepening this through skin and  subcutaneous tissues and across the platysma muscle.  A superior  myocutaneous flap was raised up to the thyroid cartilage and inferior  flap carried down to the sternal notch.  The midline strap muscles are  opened in the midline and dissection carried over towards the right side  primarily carrying dissection laterally and superiorly.  This was a very  large gland extending far upward in the neck and far laterally.  This  was freed from the inferior pole primarily so as to get the thyroid up  adequately and dissection was then carried up to the superior pole which  was identified and doubly tied with a 2-0 silk sutures and then clipped  and transected with a Harmonic scalpel.  Dissection of the thyroid on  the left side was carried out.  I did not see the superior pole  parathyroid gland.  The inferior pole parathyroid gland was positively  identified as  well as the recurrent laryngeal nerve on the right side  which was then protected throughout the course of dissection.  Dissection was then carried down across the thyroid ima using both clips  and then the Harmonic scalpel so as to mobilize the medial part of the  lobe and superiorly the pyramidal lobe.  The dissection within the right  neck was then inspected.  Additional bleeding points were treated with  electrocautery.  This side of the neck was then packed and attention  turned to the left side of the neck where similarly the strap muscles  were dissected free from off the thyroid gland carrying the dissection  laterally and down to the sulcus of the neck.  This portion of the neck  there was a very large inspissated tubercle of Zuckerkandl which was  dissected free from the surrounding soft tissues and released,  the left-  sided recurrent  laryngeal nerve was seen and protected throughout the  entire course of dissection.  Dissection was carried up towards superior  pole and again the superior pole vessels were isolated, doubly tied with  2-0 silk, clipped and  then transected with a Harmonic scalpel.  The  left side the superior parathyroid gland was identified and protected as  well as the recurrent laryngeal nerve as the dissection was carried down  along the thyroid gland and down to the inferior pole.  The inferior  pole vessels were identified, doubly clipped and transected and the  remainder of the thyroid ima was transected with a Harmonic scalpel.  Dissection then was carried down to the trachea where the thyroid was  dissected free from the trachea by lysing the adhesions along the  ligament of Berry and maintaining hemostasis with small clips and with  electrocautery.  The entire thyroid gland was removed.  I placed a  single suture of the superior pole of the right side further  identification.  All areas of dissection within the neck were checked  for hemostasis.  The neck was then thoroughly irrigated with normal  saline.  Sponge, instrument and sharp counts were verified.  I placed  several small strips of Surgicel within the neck for additional  hemostasis.  Midline strap muscles were then closed with interrupted 3-0  Vicryl sutures.  The platysma muscle was closed with interrupted 3-0  Vicryl sutures.  Skin was closed with running 5-0 Monocryl suture  reinforced with Steri-Strips and a sterile compressive dressing applied.  Anesthetic was reversed.  The patient removed from the operating room to  the recovery room in stable condition.  She tolerated the procedure  well.      Kathrin Penner, M.D.  Electronically Signed     PB/MEDQ  D:  09/15/2008  T:  09/15/2008  Job:  [id number]   cc:   Jacelyn Pi, M.D.  Fax: [phone]

## 2011-03-01 NOTE — Assessment & Plan Note (Signed)
Her blood pressure remains poorly controlled. I will increase her Procardia today.

## 2011-03-01 NOTE — Assessment & Plan Note (Signed)
Her HIV infection remains under excellent control. I will continue her current regimen.

## 2011-03-01 NOTE — Assessment & Plan Note (Signed)
I have encouraged her to consider quitting cigarettes completely.

## 2011-03-01 NOTE — Progress Notes (Signed)
  Subjective:    Patient ID: [name], female    DOB: [birth year], 45 y.o.   MRN: [id number]  HPI Mychal is in for her routine visit. Her cough subsided after stopping lisinopril. She has not missed a single dose of her medications. Her father has been taking her blood pressure at home and it has remained elevated it usually in the range of 160/100. She started her Procardia XL the day of her last visit along with hydrochlorothiazide. She has joined a gym and is taking the Zumba classes and also walking more regularly. She has cut down on her cigarettes.    Review of Systems     Objective:   Physical Exam  Constitutional: No distress.  HENT:  Mouth/Throat: Oropharynx is clear and moist. No oropharyngeal exudate.  Cardiovascular: Normal rate, regular rhythm and normal heart sounds.   No murmur heard. Pulmonary/Chest: Breath sounds normal. She has no wheezes. She has no rales.  Psychiatric: She has a normal mood and affect.          Assessment & Plan:

## 2011-03-03 ENCOUNTER — Telehealth: Payer: Self-pay | Admitting: *Deleted

## 2011-03-03 DIAGNOSIS — E039 Hypothyroidism, unspecified: Secondary | ICD-10-CM

## 2011-03-03 NOTE — Telephone Encounter (Signed)
Pt is needing referral to an endocrinologist.  Her current one in no longer accepting Medicaid.

## 2011-03-04 NOTE — Op Note (Signed)
   NAMEVERENE, ZOLLARS                          ACCOUNT NO.:  [id number]   MEDICAL RECORD NO.:  [id number]                   PATIENT TYPE:  AMB   LOCATION:  [place]                                  FACILITY:  [facility]   PHYSICIAN:  Merri Ray. Grandville Silos, M.D.             DATE OF BIRTH:  [birth year]   DATE OF PROCEDURE:  09/10/2002  DATE OF DISCHARGE:                                 OPERATIVE REPORT   PREOPERATIVE DIAGNOSIS:  Hydradenitis right inframammary fold.   POSTOPERATIVE DIAGNOSIS:  Hydradenitis right inframammary fold.   PROCEDURE:  Excision of hydradenitis right inframammary fold.   SURGEON:  Merri Ray. Grandville Silos, M.D.   ANESTHESIA:  MAC.   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  She presented to the office complaining of an area of  two inflamed cysts beneath her right breast. She presents for elective  excision of this effected area today.   DESCRIPTION OF PROCEDURE:  The patient was brought to the operating room and  MAC anesthesia was administered. The patient's right breast, inframammary  area, and upper abdomen were prepped and draped in the usual sterile  fashion.  She had two areas close together, a 1 cm cyst medially and a 1.5  cm cyst about 1 cm lateral to this along her mid right inframammary fold.  The area was excised in toto sharply.  Subcutaneous tissues were cauterized  with the Bovie to obtain hemostasis.  The specimen was sent as one sample.  After ensuring hemostasis, the wound was irrigated and then closed in two  layers, first with some interrupted 3-0 Vicryl subcutaneous sutures and then  the skin was closed with a running 4-0 Monocryl subcuticular stitch.  Needle, sponge, and instrument count correct.  The patient tolerated the  procedure well and was taken to the recovery room in stable condition.                     Merri Ray Grandville Silos, M.D.    BET/MEDQ  D:  09/10/2002  T:  09/10/2002  Job:  [id number]

## 2011-03-04 NOTE — Op Note (Signed)
[name], [name]                          ACCOUNT NO.:  [id number]   MEDICAL RECORD NO.:  [id number]                   PATIENT TYPE:  OBV   LOCATION:  9304                                 FACILITY:  [facility]   PHYSICIAN:  Darlyn Chamber, M.D.                DATE OF BIRTH:  [birth year]   DATE OF PROCEDURE:  11/30/2002  DATE OF DISCHARGE:                                 OPERATIVE REPORT   PREOPERATIVE DIAGNOSIS:  Nonviable first trimester pregnancy.   POSTOPERATIVE DIAGNOSIS:  Nonviable first trimester pregnancy.   OPERATIVE PROCEDURE:  Paracervical block.  Dilatation and evacuation.   SURGEON:  Darlyn Chamber, M.D.   ANESTHESIA:  Sedation with paracervical block.   ESTIMATED BLOOD LOSS:  Minimal.   PACKS AND DRAINS:  None.   INTRAOPERATIVE BLOOD PRODUCTS:  None.   COMPLICATIONS:  None.   INDICATIONS:  This patient is a 45 year old gravida 2, para 1, black female,  who had been seen in the office on Friday with first trimester bleeding.  Ultrasound revealed a nonviable first trimester pregnancy. The patient was  scheduled tentatively for Monday to undergo dilatation and evacuation  because of bleeding.  She came in for further evaluation and was brought in  for dilatation and evacuation at the present time.  Blood type is unknown.  The risks of the procedure have been discussed, including the risks of  infection.  The risk of hemorrhage can necessitate transfusion with a  possible risk of hepatitis.  Excessive bleeding can lead to the need for  possible hysterectomy. There was also the risk of uterine perforation that  could lead to injury to adjacent organs, requiring laparoscopy and possible  exploratory laparotomy, the risk of deep venous thrombosis and pulmonary  embolus.   PROCEDURE:  The patient was taken to the OR and was placed in the supine  position.  After satisfactory level of sedation, she was placed in the  dorsal lithotomy position using Allen stirrups.  A  speculum was placed in  the vaginal vault.  The cervix and vagina were cleansed with Betadine.  A  paracervical block was instituted using 1% Xylocaine.  The cervix was  grasped with a single-tooth tenaculum. The cervix was further dilated to a  size 27 Pratt dilator.  A size 8 curved suction curette was introduced.  The  contents were removed using the suction curette, and minimal tissue was  obtained.  Subsequent sharp curetting revealed all quadrants to be clear.  Repeat suction curetting was also clear.  The single tooth tenaculum and  speculum were then removed. She had minimal vaginal bleeding.  There were no  signs of perforation.  The patient was transferred to the recovery room in  good condition.  Sponge, instrument, and needle count was correct by the  circulating nurse.  Darlyn Chamber, M.D.   JSM/MEDQ  D:  11/30/2002  T:  11/30/2002  Job:  [id number]

## 2011-03-04 NOTE — Consult Note (Signed)
[facility]  Patient:    [name], [name] Visit Number: [id number] MRN: [id number]          Service Type: EMS Location: [facility] Attending Physician:  Lebron Conners Dictated by:   Leonides Sake Lucia Gaskins, M.D. Proc. Date: 12/26/01 Admit Date:  12/25/2001 Discharge Date: 12/25/2001                            Consultation Report  EMERGENCY ROOM CONSULTATION  REASON FOR CONSULTATION:  The patient is a 45 year old black female with history of previous left peritonsillar abscess approximately a year ago who now has recurrent symptoms of left peritonsillar abscess, was seen two days ago in the office and treated with antibiotics and symptoms have worsened, and she presents to the emergency room because of worsening symptoms, decreased p.o.  PHYSICAL EXAMINATION:  THROAT:  She has a pronounced left tonsillar abscess. The area was injected with Xylocaine with epinephrine for a local anesthetic. The peritonsillar area was incised and a large amount of mucopurulent discharge was obtained. After adequate drainage of the left peritonsillar abscess the procedure was completed.  IMPRESSION:  Left peritonsillar abscess.  RECOMMENDATION:  This was incised and drained in the ER. The patient was given a liter of normal saline along with IV Rocephin and will complete her p.o. antibiotics amoxicillin and/or Augmentin at home. Will notify us in 48 hours if symptoms have not significantly improved, otherwise discussed with the concern a possible tonsillectomy in the near future. Dictated by:   Leonides Sake Lucia Gaskins, M.D. Attending Physician:  Lebron Conners DD:  12/26/01 TD:  12/26/01 Job: [id number]

## 2011-03-08 ENCOUNTER — Other Ambulatory Visit: Payer: Self-pay | Admitting: *Deleted

## 2011-03-08 DIAGNOSIS — I10 Essential (primary) hypertension: Secondary | ICD-10-CM

## 2011-03-08 DIAGNOSIS — E039 Hypothyroidism, unspecified: Secondary | ICD-10-CM

## 2011-03-08 DIAGNOSIS — J302 Other seasonal allergic rhinitis: Secondary | ICD-10-CM

## 2011-03-08 MED ORDER — ALBUTEROL SULFATE HFA 108 (90 BASE) MCG/ACT IN AERS: 2.0000 | INHALATION_SPRAY | Freq: Four times a day (QID) | RESPIRATORY_TRACT | Status: DC | PRN

## 2011-03-08 MED ORDER — LORATADINE 10 MG PO TABS: 10.0000 mg | ORAL_TABLET | Freq: Every day | ORAL | Status: DC

## 2011-03-08 MED ORDER — NIFEDIPINE 60 MG (OSM) PO TB24: 60.0000 mg | ORAL_TABLET | Freq: Every day | ORAL | Status: DC

## 2011-03-08 MED ORDER — LEVOTHYROXINE SODIUM 75 MCG PO TABS: 75.0000 ug | ORAL_TABLET | Freq: Every day | ORAL | Status: DC

## 2011-03-08 MED ORDER — HYDROCHLOROTHIAZIDE 12.5 MG PO CAPS: 12.5000 mg | ORAL_CAPSULE | Freq: Every day | ORAL | Status: DC

## 2011-03-17 ENCOUNTER — Encounter: Payer: Self-pay | Admitting: Endocrinology

## 2011-03-17 ENCOUNTER — Ambulatory Visit (INDEPENDENT_AMBULATORY_CARE_PROVIDER_SITE_OTHER): Payer: Medicaid Other | Admitting: Endocrinology

## 2011-03-17 DIAGNOSIS — E890 Postprocedural hypothyroidism: Secondary | ICD-10-CM

## 2011-03-17 MED ORDER — LEVOTHYROXINE SODIUM 125 MCG PO TABS: 125.0000 ug | ORAL_TABLET | Freq: Every day | ORAL | Status: DC

## 2011-03-17 NOTE — Progress Notes (Signed)
Subjective:    Patient ID: [name], female    DOB: Jan 10, 1966, 45 y.o.   MRN: [id number]  HPI Pt was seen at [facility] in 2007 for ear infection.  She was incidentally noted to have a thyroid nodule.  bx in 2008 was suspicious, so she had a thyroidect in 2009.  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  She says her thyroid had been overactive, but preoperative lab results are not available now.  She was placed on synthroid postoperatively.  She has been on it since then, except for a gap in her insurance.  She says she has been back on synthroid x 2-3 weeks now.   Symptomatically, she reports 1 month of moderate diaphoresis throughout her body, and assoc headache.  Past Medical History  Diagnosis Date  . Hyperthyroidism     Past Surgical History  Procedure Date  . Breast surgery 1997    Breast Reduction   . Total thyroidectomy 2010  . [place] 1994    History   Social History  . Marital Status: Single    Spouse Name: N/A    Number of Children: N/A  . Years of Education: N/A   Occupational History  . Finance    Social History Main Topics  . Smoking status: Current Everyday Smoker -- 0.5 packs/day for 15 years    Types: Cigarettes  . Smokeless tobacco: Not on file  . Alcohol Use: 0.5 oz/week    1 drink(s) per week  . Drug Use: No  . Sexually Active: Not Currently   Other Topics Concern  . Not on file   Social History Narrative  . No narrative on file    Current Outpatient Prescriptions on File Prior to Visit  Medication Sig Dispense Refill  . albuterol (VENTOLIN HFA) 108 (90 BASE) MCG/ACT inhaler Inhale 2 puffs into the lungs 4 (four) times daily as needed.  1 Inhaler  3  . ATRIPLA 600-200-300 MG per tablet take 1 tablet by mouth at bedtime  30 tablet  10  . hydrochlorothiazide (MICROZIDE) 12.5 MG capsule Take 1 capsule (12.5 mg total) by mouth daily.  30 capsule  11  . levothyroxine (SYNTHROID, LEVOTHROID) 75 MCG tablet Take 1 tablet (75 mcg total) by mouth daily.  30 tablet  11    . loratadine (CLARITIN) 10 MG tablet Take 1 tablet (10 mg total) by mouth daily.  30 tablet  11  . NIFEdipine (PROCARDIA XL) 60 MG (OSM) 24 hr tablet Take 1 tablet (60 mg total) by mouth daily.  30 tablet  11  . DISCONTD: ibuprofen (ADVIL,MOTRIN) 800 MG tablet Take 800 mg by mouth every 6 (six) hours as needed. For foot pain          Allergies  Allergen Reactions  . Lisinopril Cough    Family History  Problem Relation Age of Onset  . Cancer Other     FH of Breast Cancer  . Cancer Other     Fh of Lung Cancer  . Heart disease Other   . Hypertension Other   . Stroke Other     Grandparent  . Kidney disease Other     Grandparent  . Diabetes Other     FH of Diabetes    BP 126/84  Pulse 73  Temp(Src) 97.3 F (36.3 C) (Oral)  Ht 5\' 5"  (1.651 m)  Wt 240 lb (108.863 kg)  BMI 39.94 kg/m2  SpO2 98%  LMP 02/19/2011    Review of Systems Denies hair loss, sob,  memory loss, constipation, dry skin, rhinorrhea, and syncope.  She reports weight gain, fatigue, dry mouth, mild depression, muscle cramps, acral numbness, blurry vision, easy bruising, and diffuse myalgias.      Objective:   Physical Exam VS: see vs page GEN: no distress HEAD: head: no deformity eyes: no periorbital swelling.  There is moderate bilat proptosis external nose and ears are normal mouth: no lesion seen Neck: a healed scar is present.  i do not appreciate a nodule in the thyroid or elsewhere in the neck. CHEST WALL: no deformity CV: reg rate and rhythm, no murmur ABD: abdomen is soft, nontender.  no hepatosplenomegaly.  not distended.  no hernia MUSCULOSKELETAL: muscle bulk and strength are grossly normal.  no obvious joint swelling.  gait is normal and steady EXTEMITIES: no deformity.  no ulcer on the feet.  feet are of normal color and temp.  no edema PULSES: dorsalis pedis intact bilat.  no carotid bruit NEURO:  cn 2-12 grossly intact.   readily moves all 4's.  sensation is intact to touch on the  feet SKIN:  Normal texture and temperature.  No rash or suspicious lesion is visible.   NODES:  None palpable at the neck PSYCH: alert, oriented x3.  Does not appear anxious nor depressed.    (i reviewed pathol report  Assessment & Plan:  S/p thyroidect for suspicious cytol findings.  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 Headache and other sxs, uncertain if thyroid-related.

## 2011-03-17 NOTE — Patient Instructions (Addendum)
Increase levothyroxine to 125 mcg/day. Please make a follow-up appointment in 1 month. When you return, we may adjust the amount of the levothyroxine, but you will need the medication for the rest of your life.

## 2011-03-20 DIAGNOSIS — E890 Postprocedural hypothyroidism: Secondary | ICD-10-CM | POA: Insufficient documentation

## 2011-03-20 HISTORY — DX: Postprocedural hypothyroidism: E89.0

## 2011-03-28 ENCOUNTER — Encounter: Payer: Self-pay | Admitting: *Deleted

## 2011-04-05 ENCOUNTER — Ambulatory Visit: Payer: Medicaid Other | Admitting: Internal Medicine

## 2011-04-14 ENCOUNTER — Encounter: Payer: Medicaid Other | Admitting: Endocrinology

## 2011-04-14 DIAGNOSIS — Z0289 Encounter for other administrative examinations: Secondary | ICD-10-CM

## 2011-04-14 NOTE — Progress Notes (Signed)
  Subjective:    Patient ID: [name], female    DOB: [birth year], 45 y.o.   MRN: [id number]  HPI    Review of Systems     Objective:   Physical Exam        Assessment & Plan:

## 2011-07-19 LAB — DIFFERENTIAL
Basophils Absolute: 0 10*3/uL (ref 0.0–0.1)
Lymphocytes Relative: 36 % (ref 12–46)
Lymphs Abs: 2.5 10*3/uL (ref 0.7–4.0)
Monocytes Absolute: 0.7 10*3/uL (ref 0.1–1.0)
Monocytes Relative: 10 % (ref 3–12)
Neutro Abs: 3.6 10*3/uL (ref 1.7–7.7)

## 2011-07-19 LAB — CBC
HCT: 39.3 % (ref 36.0–46.0)
MCV: 88.8 fL (ref 78.0–100.0)
Platelets: 412 10*3/uL — ABNORMAL HIGH (ref 150–400)
RDW: 15.4 % (ref 11.5–15.5)

## 2011-07-19 LAB — COMPREHENSIVE METABOLIC PANEL
Albumin: 3.5 g/dL (ref 3.5–5.2)
BUN: 9 mg/dL (ref 6–23)
Calcium: 9 mg/dL (ref 8.4–10.5)
Creatinine, Ser: 0.9 mg/dL (ref 0.4–1.2)
Total Protein: 7.3 g/dL (ref 6.0–8.3)

## 2011-07-19 LAB — PREGNANCY, URINE: Preg Test, Ur: NEGATIVE

## 2011-07-19 LAB — APTT: aPTT: 28 seconds (ref 24–37)

## 2011-07-19 LAB — ABO/RH: ABO/RH(D): O POS

## 2011-07-19 LAB — TYPE AND SCREEN: ABO/RH(D): O POS

## 2011-07-21 LAB — CBC
Hemoglobin: 11.8 g/dL — ABNORMAL LOW (ref 12.0–15.0)
MCHC: 33.5 g/dL (ref 30.0–36.0)
RDW: 15.3 % (ref 11.5–15.5)

## 2011-07-25 ENCOUNTER — Other Ambulatory Visit: Payer: Self-pay | Admitting: Infectious Disease

## 2011-07-25 ENCOUNTER — Other Ambulatory Visit (INDEPENDENT_AMBULATORY_CARE_PROVIDER_SITE_OTHER): Payer: Medicaid Other

## 2011-07-25 ENCOUNTER — Other Ambulatory Visit: Payer: Self-pay | Admitting: Internal Medicine

## 2011-07-25 DIAGNOSIS — B20 Human immunodeficiency virus [HIV] disease: Secondary | ICD-10-CM

## 2011-07-25 LAB — CBC WITH DIFFERENTIAL/PLATELET
Basophils Absolute: 0 10*3/uL (ref 0.0–0.1)
Basophils Relative: 1 % (ref 0–1)
Eosinophils Absolute: 0.1 10*3/uL (ref 0.0–0.7)
Eosinophils Relative: 2 % (ref 0–5)
Lymphocytes Relative: 39 % (ref 12–46)
MCH: 27.3 pg (ref 26.0–34.0)
MCV: 85 fL (ref 78.0–100.0)
Platelets: 354 10*3/uL (ref 150–400)
RDW: 16.7 % — ABNORMAL HIGH (ref 11.5–15.5)
WBC: 5.9 10*3/uL (ref 4.0–10.5)

## 2011-07-25 LAB — COMPREHENSIVE METABOLIC PANEL
ALT: 20 U/L (ref 0–35)
AST: 21 U/L (ref 0–37)
Alkaline Phosphatase: 133 U/L — ABNORMAL HIGH (ref 39–117)
Creat: 0.8 mg/dL (ref 0.50–1.10)
Total Bilirubin: 0.3 mg/dL (ref 0.3–1.2)

## 2011-07-26 LAB — T-HELPER CELL (CD4) - (RCID CLINIC ONLY): CD4 % Helper T Cell: 33 % (ref 33–55)

## 2011-07-29 LAB — T-HELPER CELL (CD4) - (RCID CLINIC ONLY): CD4 T Cell Abs: 770

## 2011-08-01 ENCOUNTER — Other Ambulatory Visit: Payer: Self-pay | Admitting: Internal Medicine

## 2011-08-01 ENCOUNTER — Telehealth: Payer: Self-pay | Admitting: *Deleted

## 2011-08-01 DIAGNOSIS — Z1231 Encounter for screening mammogram for malignant neoplasm of breast: Secondary | ICD-10-CM

## 2011-08-01 NOTE — Telephone Encounter (Signed)
Wanted number to call to reschedule the mammogram she did not go to & to ask for chantix to be sent to [facility] on [address]  I gave her the number for the breast center

## 2011-08-08 ENCOUNTER — Ambulatory Visit (INDEPENDENT_AMBULATORY_CARE_PROVIDER_SITE_OTHER): Payer: Medicaid Other | Admitting: Internal Medicine

## 2011-08-08 ENCOUNTER — Encounter: Payer: Self-pay | Admitting: Internal Medicine

## 2011-08-08 VITALS — BP 162/91 | HR 72 | Temp 98.0°F | Ht 65.0 in | Wt 237.5 lb

## 2011-08-08 DIAGNOSIS — I10 Essential (primary) hypertension: Secondary | ICD-10-CM

## 2011-08-08 DIAGNOSIS — F17200 Nicotine dependence, unspecified, uncomplicated: Secondary | ICD-10-CM

## 2011-08-08 DIAGNOSIS — Z23 Encounter for immunization: Secondary | ICD-10-CM

## 2011-08-08 DIAGNOSIS — B20 Human immunodeficiency virus [HIV] disease: Secondary | ICD-10-CM

## 2011-08-08 DIAGNOSIS — L0292 Furuncle, unspecified: Secondary | ICD-10-CM

## 2011-08-08 MED ORDER — DOXYCYCLINE HYCLATE 100 MG PO TABS: 100.0000 mg | ORAL_TABLET | Freq: Two times a day (BID) | ORAL | Status: AC

## 2011-08-08 MED ORDER — VARENICLINE TARTRATE 0.5 MG PO TABS: 0.5000 mg | ORAL_TABLET | Freq: Two times a day (BID) | ORAL | Status: DC

## 2011-08-08 NOTE — Progress Notes (Signed)
  Subjective:    Patient ID: [name], female    DOB: Jan 12, 1966, 45 y.o.   MRN: [id number]  HPI Hermenia is in for her routine visit. She has decided to try to quit smoking cigarettes again in feel she has a much stronger desire now than in past attempts. Several family members are also trying to quit and has set a quit date for one week from now. She wants to try Chantix. She does not believe she is missed any doses of her Atripla. She has had some problem with recurrent boils under her right arm and under her left breast.    Review of Systems  Constitutional: Positive for unexpected weight change. Negative for fever, chills, activity change, appetite change and fatigue.       Objective:   Physical Exam  Constitutional: No distress.  HENT:  Mouth/Throat: Oropharynx is clear and moist. No oropharyngeal exudate.  Cardiovascular: Normal rate, regular rhythm and normal heart sounds.   No murmur heard. Pulmonary/Chest: Breath sounds normal. She has no wheezes. She has no rales.  Skin:       She has fairly extensive scarring in both axilla and under her breasts. She is tender swelling in the right axilla without any fluctuance or drainage and a similar lesion under her left breast.  Psychiatric: She has a normal mood and affect.          Assessment & Plan:

## 2011-08-08 NOTE — Assessment & Plan Note (Signed)
Now that she has a strong desire to quit I have asked her to call the [place] quit line and developed a comprehensive plan to quit. I've also agreed to give her a prescription for Chantix but asked her to call me if she has any problems tolerating it.

## 2011-08-08 NOTE — Assessment & Plan Note (Signed)
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 I will retry doxycycline empirically. I do not feel she needs incision and drainage at this point. She's had fairly extensive disease with the protracted course and may actually have a component of hidradenitis.

## 2011-08-08 NOTE — Assessment & Plan Note (Signed)
Her adherence is excellent and as a result her viral load remains undetectable at less than 20 and her CD4 count is normal at 790. I will continue Atripla.

## 2011-08-08 NOTE — Assessment & Plan Note (Signed)
She inadvertently stopped her hydrochlorothiazide and her blood pressure is elevated today. I will have her restart it and have her back within 6 weeks for repeat blood pressure check.

## 2011-08-12 ENCOUNTER — Telehealth: Payer: Self-pay | Admitting: *Deleted

## 2011-08-12 ENCOUNTER — Other Ambulatory Visit: Payer: Self-pay | Admitting: *Deleted

## 2011-08-12 ENCOUNTER — Ambulatory Visit: Payer: Medicaid Other

## 2011-08-12 DIAGNOSIS — F17210 Nicotine dependence, cigarettes, uncomplicated: Secondary | ICD-10-CM

## 2011-08-12 DIAGNOSIS — F17200 Nicotine dependence, unspecified, uncomplicated: Secondary | ICD-10-CM

## 2011-08-12 MED ORDER — VARENICLINE TARTRATE 0.5 MG PO TABS: 0.5000 mg | ORAL_TABLET | Freq: Two times a day (BID) | ORAL | Status: DC

## 2011-08-12 NOTE — Telephone Encounter (Signed)
She decided she does not want to use Chantix but wants wellbutrin prescribed for her. Told her the md will have to order this. I will let her know when md responds. Told her it may be next Tuesday when he is here

## 2011-08-16 MED ORDER — BUPROPION HCL ER (XL) 150 MG PO TB24: 150.0000 mg | ORAL_TABLET | ORAL | Status: DC

## 2011-08-19 ENCOUNTER — Ambulatory Visit (INDEPENDENT_AMBULATORY_CARE_PROVIDER_SITE_OTHER): Payer: Medicaid Other | Admitting: Internal Medicine

## 2011-08-19 ENCOUNTER — Other Ambulatory Visit (HOSPITAL_COMMUNITY)
Admission: RE | Admit: 2011-08-19 | Discharge: 2011-08-19 | Disposition: A | Discharge status: Home / Self Care | Payer: Medicaid Other | Source: Ambulatory Visit | Attending: Internal Medicine | Admitting: Internal Medicine

## 2011-08-19 DIAGNOSIS — Z124 Encounter for screening for malignant neoplasm of cervix: Secondary | ICD-10-CM

## 2011-08-19 DIAGNOSIS — Z01419 Encounter for gynecological examination (general) (routine) without abnormal findings: Secondary | ICD-10-CM | POA: Insufficient documentation

## 2011-08-19 NOTE — Progress Notes (Signed)
  Subjective:     [name] is a 45 y.o. woman who comes in today for a  pap smear only. Contraception: Condoms  Review of Systems   Objective:    LMP 08/01/2011 Pelvic Exam:Pap smear obtained.   Assessment:    Screening pap smear.   Pt c/o of heavy menses with clots.  Had 2 menses this past month.  Is stressed due to family issues and deaths in family.  Interested in seeing GYN MD for possible uterine ablation.  Plan:    Follow up in one year, or as indicated by Pap results.   Pt given educational materials re:  HIV and women, pap smears, women's health, nutrition, diet and self-esteem.

## 2011-08-19 NOTE — Patient Instructions (Signed)
  Your results will be ready in about a week.  I will mail them to you.  Thank you for coming to the center for your care.  Langley Gauss

## 2011-08-24 ENCOUNTER — Encounter: Payer: Self-pay | Admitting: *Deleted

## 2011-09-20 ENCOUNTER — Ambulatory Visit: Payer: Medicaid Other | Admitting: Internal Medicine

## 2011-12-26 ENCOUNTER — Telehealth: Payer: Self-pay | Admitting: *Deleted

## 2011-12-26 NOTE — Telephone Encounter (Signed)
States she has been sweating a lot for about 1 month. Wants to be seen. She had similar problem in past & saw md at [facility] about this & her thyroid. I asked if she had been back to him as it may be the same issue. States it feels different & she has been taking her pills. Transferred to front to make an appt She has not seen an endocrinologist

## 2011-12-27 ENCOUNTER — Ambulatory Visit
Admission: RE | Admit: 2011-12-27 | Discharge: 2011-12-27 | Disposition: A | Discharge status: Home / Self Care | Payer: Medicaid Other | Source: Ambulatory Visit | Attending: Infectious Diseases | Admitting: Infectious Diseases

## 2011-12-27 ENCOUNTER — Encounter: Payer: Self-pay | Admitting: Infectious Diseases

## 2011-12-27 ENCOUNTER — Ambulatory Visit (INDEPENDENT_AMBULATORY_CARE_PROVIDER_SITE_OTHER): Payer: Medicaid Other | Admitting: Infectious Diseases

## 2011-12-27 ENCOUNTER — Other Ambulatory Visit: Payer: Self-pay | Admitting: *Deleted

## 2011-12-27 DIAGNOSIS — R61 Generalized hyperhidrosis: Secondary | ICD-10-CM

## 2011-12-27 DIAGNOSIS — D869 Sarcoidosis, unspecified: Secondary | ICD-10-CM

## 2011-12-27 DIAGNOSIS — L0293 Carbuncle, unspecified: Secondary | ICD-10-CM

## 2011-12-27 DIAGNOSIS — B20 Human immunodeficiency virus [HIV] disease: Secondary | ICD-10-CM

## 2011-12-27 DIAGNOSIS — L0292 Furuncle, unspecified: Secondary | ICD-10-CM

## 2011-12-27 DIAGNOSIS — E0500 Thyrotoxicosis with diffuse goiter without thyrotoxic crisis or storm: Secondary | ICD-10-CM

## 2011-12-27 LAB — CBC WITH DIFFERENTIAL/PLATELET
Eosinophils Absolute: 0.1 10*3/uL (ref 0.0–0.7)
Hemoglobin: 12 g/dL (ref 12.0–15.0)
Lymphocytes Relative: 41 % (ref 12–46)
Lymphs Abs: 3.1 10*3/uL (ref 0.7–4.0)
MCH: 27.5 pg (ref 26.0–34.0)
MCV: 86.2 fL (ref 78.0–100.0)
Monocytes Relative: 7 % (ref 3–12)
Neutrophils Relative %: 50 % (ref 43–77)
RBC: 4.36 MIL/uL (ref 3.87–5.11)

## 2011-12-27 LAB — TSH: TSH: 13.553 u[IU]/mL — ABNORMAL HIGH (ref 0.350–4.500)

## 2011-12-27 MED ORDER — SULFAMETHOXAZOLE-TRIMETHOPRIM 800-160 MG PO TABS: 1.0000 | ORAL_TABLET | Freq: Two times a day (BID) | ORAL | Status: AC

## 2011-12-27 NOTE — Assessment & Plan Note (Signed)
She appears to be doing well, her vaccines are up to date. Will have her f/u with Dr [name].

## 2011-12-27 NOTE — Assessment & Plan Note (Signed)
Will check her TSH as possible cause of her sweats.

## 2011-12-27 NOTE — Progress Notes (Signed)
Addended by: Arryana Tolleson C on: 12/27/2011 04:09 PM   Modules accepted: Orders

## 2011-12-27 NOTE — Progress Notes (Signed)
  Subjective:    Patient ID: [name], female    DOB: [birth year], 46 y.o.   MRN: [id number]  HPI 46 yo F with hx of HIV+, HTN, tobacco use, Sarcoid (1995), and prev MRSA boils. Has been on atripla (her only ART, since 2005) States for the last month she has been sweating with even minimal exertion, has it as well at night when she is sleeping. Recently had "spring break" and slept the whole time. Exposed to multiple sick kids (daughter with croup). Has not had fevers, just feels bad. Has not felt like herself in the last month. Denies any new meds, soaps or detergents. Was previously on ACE-I which made her cough. Changed to nifedipine in November.  States she is scared with 5 deaths in her family in the last 5 months. Chest has felt palpitations, had numbness in her R arm transiently (seconds).  Has persistent boils, states she has had since she was 16. One on her gluteus that will not resolve.    HIV 1 RNA Quant (copies/mL)  Date Value  07/25/2011 <20   02/14/2011 <20   10/25/2010 <20 copies/mL      CD4 T Cell Abs (cmm)  Date Value  07/25/2011 790   02/14/2011 1110   10/25/2010 740        Review of Systems  Constitutional: Positive for chills. Negative for fever, appetite change and unexpected weight change.       Wt has been going up, she attributes this to depression, being at home not working  HENT: Negative for neck pain and neck stiffness.   Eyes:       Nagging cough, mother recently found with laryngeal cancer. occas mucous, has quit/restarted smoking. Feels more like a tickle in her throat.   Respiratory: Positive for cough. Negative for shortness of breath.   Gastrointestinal: Negative for diarrhea and constipation.  Genitourinary: Positive for menstrual problem. Negative for dysuria.       Having period now. Headaches worse with menses. Periods are heavy and has clots, every 2 weeks.   Musculoskeletal: Negative for myalgias and arthralgias.  Neurological: Positive for  headaches.       Head throbs all the time, she has gone through a bottle of tylenol  Hematological: Negative for adenopathy.       Objective:   Physical Exam  Constitutional: She appears well-developed and well-nourished.  HENT:  Head: Normocephalic.  Mouth/Throat: No oropharyngeal exudate.  Eyes: EOM are normal. Pupils are equal, round, and reactive to light.  Neck: Neck supple. No thyromegaly present.  Cardiovascular: Normal rate, regular rhythm and normal heart sounds.   Pulmonary/Chest: Effort normal and breath sounds normal.  Abdominal: Soft. Bowel sounds are normal. She exhibits no distension. There is no tenderness.  Musculoskeletal: She exhibits no edema and no tenderness.       Legs: Lymphadenopathy:    She has no cervical adenopathy.  Skin:             Assessment & Plan:

## 2011-12-27 NOTE — Assessment & Plan Note (Signed)
Not sure what the etiology of these are. She could certainly be peri-menopausal. Evidence shows this happens at a younger age in HIV+. Will screen her broadly, have her seen by GYN.

## 2011-12-27 NOTE — Assessment & Plan Note (Signed)
Will check her ACE level.

## 2011-12-27 NOTE — Assessment & Plan Note (Signed)
Wound cleaned with alchohol, swab cx sent. Will start her on bactrim for 2 weeks. Due to separate draining area will send her to surgery.

## 2011-12-28 LAB — ANGIOTENSIN CONVERTING ENZYME: Angiotensin-Converting Enzyme: 37 U/L (ref 8–52)

## 2011-12-29 ENCOUNTER — Telehealth: Payer: Self-pay | Admitting: *Deleted

## 2011-12-29 ENCOUNTER — Telehealth: Payer: Self-pay | Admitting: Internal Medicine

## 2011-12-29 NOTE — Telephone Encounter (Signed)
Patient called regarding chest xray results. Dr. Baxter Flattery reviewed and called patient with results. Myrtis Hopping CMA

## 2011-12-29 NOTE — Telephone Encounter (Signed)
Notified patient of appointment at [facility] clinic for 02/06/12 at 1:00 pm. Myrtis Hopping CMA

## 2011-12-29 NOTE — Telephone Encounter (Signed)
Left msg on VM stating that CXR is not conclusive for any infection. (abnormality on formal read appears to be due to overlap of ribs and vasculature. I have asked her to call back to discuss if she has symptoms of hypothyroidism, since her work up revealed elevated TSH. Will ask her to come back in to get free T4 and see if her PCP or [place] will adjust her levothyroxine.

## 2011-12-30 LAB — WOUND CULTURE: Gram Stain: NONE SEEN

## 2012-01-03 ENCOUNTER — Other Ambulatory Visit: Payer: Medicaid Other

## 2012-01-17 ENCOUNTER — Ambulatory Visit: Payer: Medicaid Other | Admitting: Internal Medicine

## 2012-01-24 ENCOUNTER — Telehealth: Payer: Self-pay | Admitting: Licensed Clinical Social Worker

## 2012-01-24 ENCOUNTER — Ambulatory Visit: Payer: Medicaid Other | Admitting: Internal Medicine

## 2012-01-25 ENCOUNTER — Other Ambulatory Visit: Payer: Self-pay | Admitting: Internal Medicine

## 2012-01-25 DIAGNOSIS — B20 Human immunodeficiency virus [HIV] disease: Secondary | ICD-10-CM

## 2012-01-25 NOTE — Telephone Encounter (Signed)
Patient missed appointment called and left message for her to call and reschedule.

## 2012-01-27 ENCOUNTER — Encounter (INDEPENDENT_AMBULATORY_CARE_PROVIDER_SITE_OTHER): Payer: Medicaid Other | Admitting: Surgery

## 2012-01-30 ENCOUNTER — Encounter (INDEPENDENT_AMBULATORY_CARE_PROVIDER_SITE_OTHER): Payer: Self-pay | Admitting: Surgery

## 2012-01-30 ENCOUNTER — Inpatient Hospital Stay (HOSPITAL_COMMUNITY)
Admission: AD | Admit: 2012-01-30 | Discharge: 2012-01-31 | DRG: 606 | Disposition: A | Discharge status: Home / Self Care | Payer: Medicaid Other | Source: Ambulatory Visit | Attending: Surgery | Admitting: Surgery

## 2012-01-30 ENCOUNTER — Ambulatory Visit (INDEPENDENT_AMBULATORY_CARE_PROVIDER_SITE_OTHER): Payer: Medicaid Other | Admitting: Surgery

## 2012-01-30 ENCOUNTER — Other Ambulatory Visit (HOSPITAL_COMMUNITY): Payer: Self-pay | Admitting: Pharmacy Technician

## 2012-01-30 ENCOUNTER — Encounter (HOSPITAL_COMMUNITY): Payer: Self-pay | Admitting: *Deleted

## 2012-01-30 VITALS — BP 160/98 | HR 68 | Temp 97.9°F | Resp 18 | Ht 65.0 in | Wt 240.2 lb

## 2012-01-30 DIAGNOSIS — F329 Major depressive disorder, single episode, unspecified: Secondary | ICD-10-CM | POA: Diagnosis present

## 2012-01-30 DIAGNOSIS — L02213 Cutaneous abscess of chest wall: Secondary | ICD-10-CM

## 2012-01-30 DIAGNOSIS — L732 Hidradenitis suppurativa: Principal | ICD-10-CM | POA: Diagnosis present

## 2012-01-30 DIAGNOSIS — E0500 Thyrotoxicosis with diffuse goiter without thyrotoxic crisis or storm: Secondary | ICD-10-CM | POA: Diagnosis present

## 2012-01-30 DIAGNOSIS — I10 Essential (primary) hypertension: Secondary | ICD-10-CM | POA: Diagnosis present

## 2012-01-30 DIAGNOSIS — J45909 Unspecified asthma, uncomplicated: Secondary | ICD-10-CM | POA: Diagnosis present

## 2012-01-30 DIAGNOSIS — F17200 Nicotine dependence, unspecified, uncomplicated: Secondary | ICD-10-CM | POA: Diagnosis present

## 2012-01-30 DIAGNOSIS — L02219 Cutaneous abscess of trunk, unspecified: Secondary | ICD-10-CM

## 2012-01-30 DIAGNOSIS — J302 Other seasonal allergic rhinitis: Secondary | ICD-10-CM

## 2012-01-30 DIAGNOSIS — B20 Human immunodeficiency virus [HIV] disease: Secondary | ICD-10-CM

## 2012-01-30 DIAGNOSIS — F3289 Other specified depressive episodes: Secondary | ICD-10-CM | POA: Diagnosis present

## 2012-01-30 LAB — BASIC METABOLIC PANEL
BUN: 10 mg/dL (ref 6–23)
CO2: 23 mEq/L (ref 19–32)
Calcium: 9.4 mg/dL (ref 8.4–10.5)
Chloride: 98 mEq/L (ref 96–112)
Creatinine, Ser: 0.67 mg/dL (ref 0.50–1.10)
GFR calc Af Amer: >90 mL/min (ref >90)

## 2012-01-30 LAB — CBC
MCHC: 33 g/dL (ref 30.0–36.0)
Platelets: 383 10*3/uL (ref 150–400)
RDW: 16.2 % — ABNORMAL HIGH (ref 11.5–15.5)
WBC: 9.2 10*3/uL (ref 4.0–10.5)

## 2012-01-30 LAB — PREGNANCY, URINE: Preg Test, Ur: NEGATIVE

## 2012-01-30 MED ORDER — HYDROMORPHONE HCL PF 1 MG/ML IJ SOLN: 0.5000 mg | INTRAMUSCULAR | Status: DC | PRN

## 2012-01-30 MED ORDER — MAGNESIUM HYDROXIDE 400 MG/5ML PO SUSP: 30.0000 mL | Freq: Three times a day (TID) | ORAL | Status: DC | PRN

## 2012-01-30 MED ORDER — PIPERACILLIN-TAZOBACTAM 3.375 G IVPB
3.3750 g | Freq: Three times a day (TID) | INTRAVENOUS | Status: DC
  Filled 2012-01-30 (×5): qty 50

## 2012-01-30 MED ORDER — DIPHENHYDRAMINE HCL 50 MG/ML IJ SOLN
INTRAMUSCULAR | Status: AC
  Administered 2012-01-30: 12.5 mg via INTRAVENOUS
  Filled 2012-01-30: qty 1

## 2012-01-30 MED ORDER — METOPROLOL TARTRATE 12.5 MG HALF TABLET
12.5000 mg | ORAL_TABLET | Freq: Two times a day (BID) | ORAL | Status: DC | PRN
  Filled 2012-01-30: qty 1

## 2012-01-30 MED ORDER — HEPARIN SODIUM (PORCINE) 5000 UNIT/ML IJ SOLN
5000.0000 [IU] | Freq: Three times a day (TID) | INTRAMUSCULAR | Status: DC
  Filled 2012-01-30 (×5): qty 1

## 2012-01-30 MED ORDER — LIP MEDEX EX OINT: 1.0000 "application " | TOPICAL_OINTMENT | Freq: Two times a day (BID) | CUTANEOUS | Status: DC

## 2012-01-30 MED ORDER — NAPROXEN 500 MG PO TABS
500.0000 mg | ORAL_TABLET | Freq: Two times a day (BID) | ORAL | Status: DC
  Filled 2012-01-30 (×2): qty 1

## 2012-01-30 MED ORDER — DIPHENHYDRAMINE HCL 12.5 MG/5ML PO ELIX
12.5000 mg | ORAL_SOLUTION | Freq: Four times a day (QID) | ORAL | Status: DC | PRN
  Filled 2012-01-30: qty 10

## 2012-01-30 MED ORDER — ONDANSETRON HCL 4 MG/2ML IJ SOLN: 4.0000 mg | Freq: Four times a day (QID) | INTRAMUSCULAR | Status: DC | PRN

## 2012-01-30 MED ORDER — MAGIC MOUTHWASH
15.0000 mL | Freq: Four times a day (QID) | ORAL | Status: DC | PRN
  Filled 2012-01-30: qty 15

## 2012-01-30 MED ORDER — DIPHENHYDRAMINE HCL 50 MG/ML IJ SOLN
12.5000 mg | Freq: Four times a day (QID) | INTRAMUSCULAR | Status: DC | PRN
  Administered 2012-01-30: 12.5 mg via INTRAVENOUS

## 2012-01-30 MED ORDER — DEXTROSE IN LACTATED RINGERS 5 % IV SOLN: INTRAVENOUS | Status: DC

## 2012-01-30 MED ORDER — SODIUM CHLORIDE 0.9 % IV SOLN
2000.0000 mg | INTRAVENOUS | Status: AC
  Administered 2012-01-30: 2000 mg via INTRAVENOUS
  Filled 2012-01-30: qty 2000

## 2012-01-30 MED ORDER — ACETAMINOPHEN 650 MG RE SUPP: 650.0000 mg | Freq: Four times a day (QID) | RECTAL | Status: DC | PRN

## 2012-01-30 MED ORDER — MUPIROCIN 2 % EX OINT
TOPICAL_OINTMENT | CUTANEOUS | Status: AC
  Filled 2012-01-30: qty 22

## 2012-01-30 MED ORDER — OXYCODONE HCL 5 MG PO TABS: 5.0000 mg | ORAL_TABLET | ORAL | Status: DC | PRN

## 2012-01-30 MED ORDER — ACETAMINOPHEN 325 MG PO TABS: 650.0000 mg | ORAL_TABLET | Freq: Four times a day (QID) | ORAL | Status: DC | PRN

## 2012-01-30 MED ORDER — VANCOMYCIN HCL IN DEXTROSE 1-5 GM/200ML-% IV SOLN
1000.0000 mg | Freq: Three times a day (TID) | INTRAVENOUS | Status: DC
  Filled 2012-01-30 (×4): qty 200

## 2012-01-30 NOTE — Progress Notes (Signed)
Patient complained of itching all over while receiving vancomycin , only had about 100cc to be infused. Med stopped and iv fluids running . Itching subsided patient given 12.5 mg benadryl iv, then vancomycin resumed at slower rate.

## 2012-01-30 NOTE — Anesthesia Preprocedure Evaluation (Addendum)
Anesthesia Evaluation  Patient identified by MRN, date of birth, ID band Patient awake    Reviewed: Allergy & Precautions, H&P , NPO status , Patient's Chart, lab work & pertinent test results  Airway Mallampati: II TM Distance: >3 FB Neck ROM: Full    Dental No notable dental hx.    Pulmonary Current Smoker,  sacoidosis breath sounds clear to auscultation  Pulmonary exam normal       Cardiovascular hypertension, Pt. on medications negative cardio ROS  Rhythm:Regular Rate:Normal     Neuro/Psych PSYCHIATRIC DISORDERS Depression negative neurological ROS     GI/Hepatic negative GI ROS, Neg liver ROS,   Endo/Other  Hypothyroidism   Renal/GU negative Renal ROS  negative genitourinary   Musculoskeletal negative musculoskeletal ROS (+)   Abdominal   Peds negative pediatric ROS (+)  Hematology  (+) HIV,   Anesthesia Other Findings   Reproductive/Obstetrics negative OB ROS                          Anesthesia Physical Anesthesia Plan  ASA: III  Anesthesia Plan: General   Post-op Pain Management:    Induction: Intravenous  Airway Management Planned:   Additional Equipment:   Intra-op Plan:   Post-operative Plan: Extubation in OR  Informed Consent: I have reviewed the patients History and Physical, chart, labs and discussed the procedure including the risks, benefits and alternatives for the proposed anesthesia with the patient or authorized representative who has indicated his/her understanding and acceptance.   Dental advisory given  Plan Discussed with: CRNA  Anesthesia Plan Comments:         Anesthesia Quick Evaluation

## 2012-01-30 NOTE — Progress Notes (Signed)
ANTIBIOTIC CONSULT NOTE - INITIAL  Pharmacy Consult for Vancomycin Indication: Chest wall abscess  Allergies  Allergen Reactions  . Lisinopril Cough   Patient Measurements: Height: 5\' 5"  (165.1 cm) Weight: 236 lb (107.049 kg) IBW/kg (Calculated) : 57  Adjusted Body Weight: 72kg  Vital Signs: Temp: 97.9 F (36.6 C) (04/15 1654) Temp src: Oral (04/15 1654) BP: 153/96 mmHg (04/15 1654) Pulse Rate: 84  (04/15 1654) Intake/Output from previous day:   Intake/Output from this shift:    Labs: No results found for this basename: WBC:3,HGB:3,PLT:3,LABCREA:3,CREATININE:3 in the last 72 hours Estimated Creatinine Clearance: 110.2 ml/min (by C-G formula based on Cr of 0.8). No results found for this basename: VANCOTROUGH:2,VANCOPEAK:2,VANCORANDOM:2,GENTTROUGH:2,GENTPEAK:2,GENTRANDOM:2,TOBRATROUGH:2,TOBRAPEAK:2,TOBRARND:2,AMIKACINPEAK:2,AMIKACINTROU:2,AMIKACIN:2, in the last 72 hours   Microbiology: No results found for this or any previous visit (from the past 720 hour(s)).  Medical History: Past Medical History  Diagnosis Date  . Hyperthyroidism 08/2006    Grave's Disease -diffuse radiotracer uptake 08/25/06 Thyroid scan-Cold nodule to R lower lobe of thyrorid  . HIV DISEASE 07/24/2006    dx March 05  . Sarcoidosis 02/08/2007    dx as a teenager in [place] from abnl CXR. Completed 2 yrs Prednisone after lung bx confirmation. No symptoms since then.  . THYROID NODULE, RIGHT 02/08/2007  . GRAVE'S DISEASE 01/01/2008  . DEPRESSION 07/24/2006  . 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 HYPERTENSION 07/24/2006  . ASTHMA 07/24/2006  . GERD 07/24/2006  . 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 Anemia     Normocytic  . History of hidradenitis suppurativa   . Gastroenteritis 07/10/07  . Menometrorrhagia     hx of  . History of thrush    Medications:  Prescriptions prior to admission  Medication Sig Dispense Refill  . ARTIFICIAL TEARS ophthalmic solution Place 2 drops into both  eyes every 4 (four) hours as needed.        . ATRIPLA 600-200-300 MG per tablet take 1 tablet by mouth at bedtime  30 tablet  10  . hydrochlorothiazide (MICROZIDE) 12.5 MG capsule Take 1 capsule (12.5 mg total) by mouth daily.  30 capsule  11  . levothyroxine (SYNTHROID, LEVOTHROID) 125 MCG tablet Take 1 tablet (125 mcg total) by mouth daily.  30 tablet  11  . loratadine (CLARITIN) 10 MG tablet Take 1 tablet (10 mg total) by mouth daily.  30 tablet  11  . albuterol (VENTOLIN HFA) 108 (90 BASE) MCG/ACT inhaler Inhale 2 puffs into the lungs 4 (four) times daily as needed.  1 Inhaler  3   Assessment:  46yo F obese female with chest wall abscess/draining wound.   Immunocomprised with HIV.  Starting empiric Vancomycin and Zosyn.  SCr pending, no hx renal dysfunction.   Goal of Therapy:  Vancomycin trough level 15-20 mcg/ml  Plan:   Vancomycin 2g x 1 then 1g IV q8h.  Measure Vanc trough at steady state.  Follow up renal fxn and culture results.  Lolita Patella 01/30/2012,5:21 PM

## 2012-01-30 NOTE — Progress Notes (Signed)
Patient has axillary and chest wall abscess ease. She is immunosuppressed. I think she needs operative drainage. She needs IV antibiotics.  Please see my history and physical note from today.

## 2012-01-30 NOTE — H&P (Signed)
[name] is an 46 y.o. female.    Patient Care Team: Michel Bickers, MD as PCP - General (Infectious Diseases)  Chief Complaint: Pain in chest and axilla with drainage, probable infections.  HPI:  Patient is a morbidly obese and pleasant immunosuppressed female. She's had a history of infections in both armpits when she was a teenager for many years. Was recommended to consider surgery. The infections resolved. She has not needed any new treatments for a while.  She does have a history of skin infections under both breast inframammary folds on the chest wall. She had breast reduction surgery done at [facility]. Not had any recent infection and recurrent infections. However, she started getting pain and drainage in her chest wall near the right medial inframammary fold. It closes and reopens. That's been going on for a few weeks. She saw her infectious disease specialist. He was concerned and recommended incision and drainage by surgery last week.  Since that time, she started getting severe pain in her right armpit. Over the past few days, the areas become very red and tender, especially anteriorly.   Now it has begun to open up and is draining foul drainage. No problems with the left axilla or groins. She does not recall any history of MRSA.  Past Medical History  Diagnosis Date  . Hyperthyroidism 08/2006    Grave's Disease -diffuse radiotracer uptake 08/25/06 Thyroid scan-Cold nodule to R lower lobe of thyrorid  . HIV DISEASE 07/24/2006    dx March 05  . Sarcoidosis 02/08/2007    dx as a teenager in [place] from abnl CXR. Completed 2 yrs Prednisone after lung bx confirmation. No symptoms since then.  . THYROID NODULE, RIGHT 02/08/2007  . GRAVE'S DISEASE 01/01/2008  . DEPRESSION 07/24/2006  . 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 HYPERTENSION 07/24/2006  . ASTHMA 07/24/2006  . GERD 07/24/2006  . 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 Anemia     Normocytic  . History of  hidradenitis suppurativa   . Gastroenteritis 07/10/07  . Menometrorrhagia     hx of  . History of thrush     Past Surgical History  Procedure Date  . Breast surgery 1997    Breast Reduction   . Total thyroidectomy 2010  . [place] 1994  . Dilation and curettage of uterus Feb 2004    s/p for 1st trimester nonviable pregnancy  . Incision and drainage peritonsillar abscess Mar 03  . Incise and drain abcess Nov 03    s/p I &D for righ inframmary fold hidradenitis    Family History  Problem Relation Age of Onset  . Cancer Other     FH of Breast Cancer  . Cancer Other     Fh of Lung Cancer  . Heart disease Other   . Hypertension Other   . Stroke Other     Grandparent  . Kidney disease Other     Grandparent  . Diabetes Other     FH of Diabetes  . Hypertension Mother   . Cancer Mother     throat  . Hypertension Father   . Hypertension Sister   . Cancer Maternal Uncle     Stomach Cancer   Social History:  reports that she has been smoking Cigarettes.  She has a 7.5 pack-year smoking history. She has never used smokeless tobacco. She reports that she does not drink alcohol or use illicit drugs.  Allergies:  Allergies  Allergen Reactions  . Lisinopril Cough    Medications  Prior to Admission  Medication Sig Dispense Refill  . albuterol (VENTOLIN HFA) 108 (90 BASE) MCG/ACT inhaler Inhale 2 puffs into the lungs 4 (four) times daily as needed.  1 Inhaler  3  . ARTIFICIAL TEARS ophthalmic solution Place 2 drops into both eyes every 4 (four) hours as needed.        . ATRIPLA 600-200-300 MG per tablet take 1 tablet by mouth at bedtime  30 tablet  10  . hydrochlorothiazide (MICROZIDE) 12.5 MG capsule Take 1 capsule (12.5 mg total) by mouth daily.  30 capsule  11  . levothyroxine (SYNTHROID, LEVOTHROID) 125 MCG tablet Take 1 tablet (125 mcg total) by mouth daily.  30 tablet  11  . loratadine (CLARITIN) 10 MG tablet Take 1 tablet (10 mg total) by mouth daily.  30 tablet  11   No  current facility-administered medications on file as of 01/30/2012.    No results found for this or any previous visit (from the past 48 hour(s)). No results found.  Review of Systems  Constitutional: Negative for fever, chills, weight loss and malaise/fatigue.  HENT: Negative for neck pain and ear discharge.   Eyes: Negative for double vision and photophobia.  Respiratory: Negative for cough and shortness of breath.   Cardiovascular: Negative for chest pain and palpitations.  Gastrointestinal: Negative for heartburn, nausea and constipation.  Genitourinary: Negative for dysuria and urgency.  Musculoskeletal: Negative for myalgias and falls.  Skin: Positive for itching. Negative for rash.  Neurological: Negative for dizziness, speech change and headaches.  Endo/Heme/Allergies: Negative for polydipsia. Does not bruise/bleed easily.  Psychiatric/Behavioral: Negative for suicidal ideas and substance abuse.    Blood pressure 160/98, pulse 68, temperature 97.9 F (36.6 C), temperature source Temporal, resp. rate 18, height 5\' 5"  (1.651 m), weight 240 lb 3.2 oz (108.954 kg). Physical Exam  Constitutional: She appears well-developed and well-nourished. No distress.  HENT:  Head: Normocephalic and atraumatic.  Mouth/Throat: Oropharynx is clear and moist. No oropharyngeal exudate.  Eyes: Conjunctivae and EOM are normal. Pupils are equal, round, and reactive to light.  Neck: Normal range of motion. Neck supple. No tracheal deviation present.  Cardiovascular: Normal rate, regular rhythm and normal heart sounds.   Respiratory: Effort normal and breath sounds normal. No respiratory distress.    GI: Soft. She exhibits no distension. There is no tenderness. Hernia confirmed negative in the right inguinal area and confirmed negative in the left inguinal area.  Musculoskeletal: Normal range of motion. She exhibits no edema and no tenderness.  Lymphadenopathy:       Right: No inguinal adenopathy  present.       Left: No inguinal adenopathy present.  Skin: She is not diaphoretic.     Assessment Hydradenitis suppurativa with severe infection and right axilla.  Chest wall infection going up and inframammary fold. Presumed initial source.  Plan Because this is a large region and she is immunosuppressed, I think this requires drainage. The patient requires IV on top of oral antibiotics. She is very tender and declines operative note in the office. I think she will be better served in the operating room under better conditions to make sure there is not any deeper tunneling.  I presented the patient to Dr. Lucia Gaskins whom is on call for [place] tonight. He will try and get her operated on tonight. We did discuss the procedure with her :  The pathophysiology of subcutaneous abscess and differential diagnosis was discussed.  Natural history progression was discussed.  The patient's symptoms  are not adequately controlled.  Non-operative treatment has not healed the abscess.  Therefore, I recommended incision & drainage of the abscess to allow the infection to resolve and heal.  Technique, risks, benefits, alternatives discussed.  The patient expressed understanding & wished to proceed.        Bernadean Saling C. 01/30/2012, 4:01 PM

## 2012-01-31 ENCOUNTER — Encounter (HOSPITAL_COMMUNITY)
Admission: AD | Disposition: A | Discharge status: Home / Self Care | Payer: Self-pay | Source: Ambulatory Visit | Attending: Surgery

## 2012-01-31 ENCOUNTER — Encounter (HOSPITAL_COMMUNITY): Payer: Self-pay | Admitting: *Deleted

## 2012-01-31 ENCOUNTER — Inpatient Hospital Stay (HOSPITAL_COMMUNITY): Payer: Medicaid Other | Admitting: *Deleted

## 2012-01-31 DIAGNOSIS — L732 Hidradenitis suppurativa: Secondary | ICD-10-CM

## 2012-01-31 DIAGNOSIS — IMO0002 Reserved for concepts with insufficient information to code with codable children: Secondary | ICD-10-CM

## 2012-01-31 HISTORY — PX: IRRIGATION AND DEBRIDEMENT ABSCESS: SHX5252

## 2012-01-31 LAB — SURGICAL PCR SCREEN: Staphylococcus aureus: NEGATIVE

## 2012-01-31 SURGERY — IRRIGATION AND DEBRIDEMENT ABSCESS
Anesthesia: General | Site: Breast | Laterality: Right | Wound class: Dirty or Infected

## 2012-01-31 MED ORDER — MEPERIDINE HCL 50 MG/ML IJ SOLN: 6.2500 mg | INTRAMUSCULAR | Status: DC | PRN

## 2012-01-31 MED ORDER — MIDAZOLAM HCL 5 MG/5ML IJ SOLN
INTRAMUSCULAR | Status: DC | PRN
  Administered 2012-01-31: 2 mg via INTRAVENOUS

## 2012-01-31 MED ORDER — POTASSIUM CHLORIDE IN NACL 20-0.45 MEQ/L-% IV SOLN
INTRAVENOUS | Status: DC
  Administered 2012-01-31: 04:00:00 via INTRAVENOUS
  Filled 2012-01-31 (×3): qty 1000

## 2012-01-31 MED ORDER — SODIUM CHLORIDE 0.9 % IV SOLN
3.0000 g | Freq: Four times a day (QID) | INTRAVENOUS | Status: DC
  Administered 2012-01-31 (×2): 3 g via INTRAVENOUS
  Filled 2012-01-31 (×6): qty 3

## 2012-01-31 MED ORDER — ARTIFICIAL TEARS OP SOLN
2.0000 [drp] | OPHTHALMIC | Status: DC | PRN
Start: 2012-01-31 — End: 2012-01-31

## 2012-01-31 MED ORDER — ALBUTEROL SULFATE HFA 108 (90 BASE) MCG/ACT IN AERS: 2.0000 | INHALATION_SPRAY | Freq: Four times a day (QID) | RESPIRATORY_TRACT | Status: DC | PRN

## 2012-01-31 MED ORDER — PROPOFOL 10 MG/ML IV BOLUS
INTRAVENOUS | Status: DC | PRN
  Administered 2012-01-31: 200 mg via INTRAVENOUS

## 2012-01-31 MED ORDER — LACTATED RINGERS IV SOLN
INTRAVENOUS | Status: DC | PRN
  Administered 2012-01-31: via INTRAVENOUS

## 2012-01-31 MED ORDER — PROMETHAZINE HCL 25 MG/ML IJ SOLN
6.2500 mg | INTRAMUSCULAR | Status: DC | PRN
  Administered 2012-01-31: 12.5 mg via INTRAVENOUS
  Filled 2012-01-31: qty 1

## 2012-01-31 MED ORDER — AMOXICILLIN-POT CLAVULANATE 875-125 MG PO TABS
1.0000 | ORAL_TABLET | Freq: Two times a day (BID) | ORAL | Status: DC
  Administered 2012-01-31: 1 via ORAL
  Filled 2012-01-31 (×2): qty 1

## 2012-01-31 MED ORDER — LACTATED RINGERS IV SOLN: INTRAVENOUS | Status: DC

## 2012-01-31 MED ORDER — BUPIVACAINE-EPINEPHRINE 0.25% -1:200000 IJ SOLN
INTRAMUSCULAR | Status: DC | PRN
  Administered 2012-01-31: 30 mL

## 2012-01-31 MED ORDER — VANCOMYCIN HCL IN DEXTROSE 1-5 GM/200ML-% IV SOLN
1000.0000 mg | Freq: Three times a day (TID) | INTRAVENOUS | Status: DC
  Administered 2012-01-31: 1000 mg via INTRAVENOUS
  Filled 2012-01-31 (×4): qty 200

## 2012-01-31 MED ORDER — MORPHINE SULFATE 2 MG/ML IJ SOLN
1.0000 mg | INTRAMUSCULAR | Status: DC | PRN
  Administered 2012-01-31: 2 mg via INTRAVENOUS
  Filled 2012-01-31: qty 1

## 2012-01-31 MED ORDER — HYDROCODONE-ACETAMINOPHEN 5-325 MG PO TABS: 1.0000 | ORAL_TABLET | ORAL | Status: DC | PRN

## 2012-01-31 MED ORDER — EFAVIRENZ-EMTRICITAB-TENOFOVIR 600-200-300 MG PO TABS
1.0000 | ORAL_TABLET | Freq: Every day | ORAL | Status: DC
  Filled 2012-01-31: qty 1

## 2012-01-31 MED ORDER — AMOXICILLIN-POT CLAVULANATE 875-125 MG PO TABS: 1.0000 | ORAL_TABLET | Freq: Two times a day (BID) | ORAL | Status: DC

## 2012-01-31 MED ORDER — LEVOTHYROXINE SODIUM 125 MCG PO TABS
125.0000 ug | ORAL_TABLET | Freq: Every day | ORAL | Status: DC
  Administered 2012-01-31: 125 ug via ORAL
  Filled 2012-01-31 (×2): qty 1

## 2012-01-31 MED ORDER — NALOXONE HCL 0.4 MG/ML IJ SOLN
INTRAMUSCULAR | Status: DC | PRN
  Administered 2012-01-31: 20 ug via INTRAVENOUS

## 2012-01-31 MED ORDER — ALBUTEROL SULFATE HFA 108 (90 BASE) MCG/ACT IN AERS
2.0000 | INHALATION_SPRAY | Freq: Four times a day (QID) | RESPIRATORY_TRACT | Status: DC
  Filled 2012-01-31: qty 6.7

## 2012-01-31 MED ORDER — HYDROMORPHONE HCL PF 1 MG/ML IJ SOLN
0.2500 mg | INTRAMUSCULAR | Status: DC | PRN
  Administered 2012-01-31 (×2): 0.5 mg via INTRAVENOUS

## 2012-01-31 MED ORDER — 0.9 % SODIUM CHLORIDE (POUR BTL) OPTIME
TOPICAL | Status: DC | PRN
  Administered 2012-01-31: 100 mL

## 2012-01-31 MED ORDER — LORATADINE 10 MG PO TABS
10.0000 mg | ORAL_TABLET | Freq: Every day | ORAL | Status: DC
  Administered 2012-01-31: 10 mg via ORAL
  Filled 2012-01-31: qty 1

## 2012-01-31 MED ORDER — POLYVINYL ALCOHOL 1.4 % OP SOLN
2.0000 [drp] | OPHTHALMIC | Status: DC | PRN
  Filled 2012-01-31: qty 15

## 2012-01-31 MED ORDER — LIDOCAINE HCL (CARDIAC) 20 MG/ML IV SOLN
INTRAVENOUS | Status: DC | PRN
  Administered 2012-01-31: 50 mg via INTRAVENOUS

## 2012-01-31 MED ORDER — FENTANYL CITRATE 0.05 MG/ML IJ SOLN
INTRAMUSCULAR | Status: DC | PRN
  Administered 2012-01-31 (×5): 50 ug via INTRAVENOUS

## 2012-01-31 MED ORDER — HYDROCODONE-ACETAMINOPHEN 5-325 MG PO TABS
1.0000 | ORAL_TABLET | ORAL | Status: DC | PRN
  Administered 2012-01-31: 2 via ORAL
  Filled 2012-01-31: qty 2

## 2012-01-31 MED ORDER — BUPIVACAINE HCL (PF) 0.25 % IJ SOLN
INTRAMUSCULAR | Status: AC
  Filled 2012-01-31: qty 30

## 2012-01-31 MED ORDER — HYDROMORPHONE HCL PF 1 MG/ML IJ SOLN
INTRAMUSCULAR | Status: AC
  Filled 2012-01-31: qty 1

## 2012-01-31 SURGICAL SUPPLY — 34 items
BENZOIN TINCTURE PRP APPL 2/3 (GAUZE/BANDAGES/DRESSINGS) IMPLANT
BLADE HEX COATED 2.75 (ELECTRODE) ×2 IMPLANT
BLADE SURG 15 STRL LF DISP TIS (BLADE) IMPLANT
BLADE SURG 15 STRL SS (BLADE)
BLADE SURG SZ10 CARB STEEL (BLADE) ×4 IMPLANT
CANISTER SUCTION 2500CC (MISCELLANEOUS) IMPLANT
CLOTH BEACON ORANGE TIMEOUT ST (SAFETY) ×2 IMPLANT
DECANTER SPIKE VIAL GLASS SM (MISCELLANEOUS) ×2 IMPLANT
DRAIN PENROSE 18X1/2 LTX STRL (DRAIN) IMPLANT
DRAPE LAPAROTOMY TRNSV 102X78 (DRAPE) ×2 IMPLANT
DRAPE UTILITY 15X26 (DRAPE) ×2 IMPLANT
DRSG PAD ABDOMINAL 8X10 ST (GAUZE/BANDAGES/DRESSINGS) ×4 IMPLANT
ELECT REM PT RETURN 9FT ADLT (ELECTROSURGICAL) ×2
ELECTRODE REM PT RTRN 9FT ADLT (ELECTROSURGICAL) ×1 IMPLANT
GLOVE BIOGEL PI IND STRL 7.0 (GLOVE) ×1 IMPLANT
GLOVE BIOGEL PI INDICATOR 7.0 (GLOVE) ×1
GLOVE SURG SIGNA 7.5 PF LTX (GLOVE) ×4 IMPLANT
GOWN STRL NON-REIN LRG LVL3 (GOWN DISPOSABLE) ×2 IMPLANT
GOWN STRL REIN XL XLG (GOWN DISPOSABLE) ×4 IMPLANT
HOVERMATT SINGLE USE (MISCELLANEOUS) ×2 IMPLANT
KIT BASIN OR (CUSTOM PROCEDURE TRAY) ×2 IMPLANT
MANIFOLD NEPTUNE II (INSTRUMENTS) ×2 IMPLANT
NEEDLE HYPO 25X1 1.5 SAFETY (NEEDLE) ×2 IMPLANT
NS IRRIG 1000ML POUR BTL (IV SOLUTION) ×2 IMPLANT
PACK BASIC VI WITH GOWN DISP (CUSTOM PROCEDURE TRAY) ×2 IMPLANT
PENCIL BUTTON HOLSTER BLD 10FT (ELECTRODE) ×2 IMPLANT
SPONGE GAUZE 4X4 12PLY (GAUZE/BANDAGES/DRESSINGS) ×4 IMPLANT
SPONGE LAP 4X18 X RAY DECT (DISPOSABLE) ×4 IMPLANT
STRIP CLOSURE SKIN 1/2X4 (GAUZE/BANDAGES/DRESSINGS) IMPLANT
SYR BULB IRRIGATION 50ML (SYRINGE) ×2 IMPLANT
SYR CONTROL 10ML LL (SYRINGE) ×2 IMPLANT
TAPE CLOTH SURG 6X10 WHT LF (GAUZE/BANDAGES/DRESSINGS) ×4 IMPLANT
TOWEL OR 17X26 10 PK STRL BLUE (TOWEL DISPOSABLE) ×2 IMPLANT
YANKAUER SUCT BULB TIP 10FT TU (MISCELLANEOUS) ×2 IMPLANT

## 2012-01-31 NOTE — Transfer of Care (Signed)
Immediate Anesthesia Transfer of Care Note  Patient: [name]  Procedure(s) Performed: Procedure(s) (LRB): IRRIGATION AND DEBRIDEMENT ABSCESS (Right)  Patient Location: PACU  Anesthesia Type: General  Level of Consciousness: awake, alert , oriented, unresponsive and responds to stimulation  Airway & Oxygen Therapy: Patient Spontanous Breathing and Patient connected to face mask oxygen  Post-op Assessment: Report given to PACU RN, Post -op Vital signs reviewed and stable and Patient moving all extremities  Post vital signs: Reviewed and stable  Complications: No apparent anesthesia complications

## 2012-01-31 NOTE — Anesthesia Procedure Notes (Signed)
Procedure Name: LMA Insertion Date/Time: 01/31/2012 12:25 AM Performed by: [name] CATHERINE PAYNE Pre-anesthesia Checklist: Patient identified, Emergency Drugs available, Suction available and Patient being monitored Patient Re-evaluated:Patient Re-evaluated prior to inductionOxygen Delivery Method: Circle system utilized Preoxygenation: Pre-oxygenation with 100% oxygen Intubation Type: IV induction LMA: LMA inserted LMA Size: 5.0 Number of attempts: 1 Placement Confirmation: positive ETCO2 and breath sounds checked- equal and bilateral Tube secured with: Tape Dental Injury: Teeth and Oropharynx as per pre-operative assessment

## 2012-01-31 NOTE — Discharge Instructions (Signed)
Abscess An abscess (boil or furuncle) is an infected area that contains a collection of pus.  SYMPTOMS Signs and symptoms of an abscess include pain, tenderness, redness, or hardness. You may feel a moveable soft area under your skin. An abscess can occur anywhere in the body.  TREATMENT  A surgical cut (incision) may be made over your abscess to drain the pus. Gauze may be packed into the space or a drain may be looped through the abscess cavity (pocket). This provides a drain that will allow the cavity to heal from the inside outwards. The abscess may be painful for a few days, but should feel much better if it was drained.  Your abscess, if seen early, may not have localized and may not have been drained. If not, another appointment may be required if it does not get better on its own or with medications. HOME CARE INSTRUCTIONS   Only take over-the-counter or prescription medicines for pain, discomfort, or fever as directed by your caregiver.   Take your antibiotics as directed if they were prescribed. Finish them even if you start to feel better.   Keep the skin and clothes clean around your abscess.   If the abscess was drained, you will need to use gauze dressing to collect any draining pus. Dressings will typically need to be changed 3 or more times a day.   The infection may spread by skin contact with others. Avoid skin contact as much as possible.   Practice good hygiene. This includes regular hand washing, cover any draining skin lesions, and do not share personal care items.   If you participate in sports, do not share athletic equipment, towels, whirlpools, or personal care items. Shower after every practice or tournament.   If a draining area cannot be adequately covered:   Do not participate in sports.   Children should not participate in day care until the wound has healed or drainage stops.   If your caregiver has given you a follow-up appointment, it is very important  to keep that appointment. Not keeping the appointment could result in a much worse infection, chronic or permanent injury, pain, and disability. If there is any problem keeping the appointment, you must call back to this facility for assistance.  SEEK MEDICAL CARE IF:   You develop increased pain, swelling, redness, drainage, or bleeding in the wound site.   You develop signs of generalized infection including muscle aches, chills, fever, or a general ill feeling.   You have an oral temperature above 102 F (38.9 C).  MAKE SURE YOU:   Understand these instructions.   Will watch your condition.   Will get help right away if you are not doing well or get worse.  Document Released: 07/13/2005 Document Revised: 09/22/2011 Document Reviewed: 05/06/2008 ExitCare Patient Information 2012 [employer]. 

## 2012-01-31 NOTE — Anesthesia Postprocedure Evaluation (Signed)
  Anesthesia Post-op Note  Patient: [name]  Procedure(s) Performed: Procedure(s) (LRB): IRRIGATION AND DEBRIDEMENT ABSCESS (Right)  Patient Location: PACU  Anesthesia Type: General  Level of Consciousness: awake and alert   Airway and Oxygen Therapy: Patient Spontanous Breathing  Post-op Pain: mild  Post-op Assessment: Post-op Vital signs reviewed, Patient's Cardiovascular Status Stable, Respiratory Function Stable, Patent Airway and No signs of Nausea or vomiting  Post-op Vital Signs: stable  Complications: No apparent anesthesia complications

## 2012-01-31 NOTE — Op Note (Signed)
NAMEGEORGEAN, FOLTYN                ACCOUNT NO.:  [id number]  MEDICAL RECORD NO.:  [id number]  LOCATION:  [id number]                         FACILITY:  [facility]  PHYSICIAN:  Fenton Malling. Lucia Gaskins, M.D.  DATE OF BIRTH:  Mar 08, 1966  DATE OF PROCEDURE:  01/31/2012                              OPERATIVE REPORT   PREOPERATIVE DIAGNOSIS:  Abscess below right breast and right axilla and areas of hidradenitis suppurativa.  POSTOPERATIVE DIAGNOSIS:  Abscess, right axilla and below right breast with hidradenitis suppurativa.  PROCEDURE:  Incision and drainage of abscess below right breast, which was 2 x 1 cm and right axilla, which was 7 x 2 cm.  SURGEON:  Fenton Malling. Lucia Gaskins, M.D.  FIRST ASSISTANT:  None.  ANESTHESIA:  General, supervised by Dr. Montez Hageman.  ESTIMATED BLOOD LOSS:  75 cc.  I did inject 30 cc of 0.25% Marcaine.  COMPLICATIONS:  None.  INDICATION FOR PROCEDURE:  [name] is a 46 year old African American female who is followed by Dr. Michel Bickers and has been HIV positive since about 2005.  She is morbidly obese, has also history of hidradenitis and has been seen in the past for this.  She came to our Urgent Office on April 15 and saw Dr. Johney Maine who felt that he could not drain these in the office and talked to me about draining these in the OR since I am on-call at [facility].  I met the patient and talked to her about the options for treatment.  I thought a surgical an incision and drainage would be the best way to treat these abscesses.  I did speak with Dr. [name] briefly on the phone who recommended using Unasyn as an antibiotic and he will be happy to follow her up in his office.  The patient understands this is a chronic condition. Incision and drainage may take care of this acute episode, she is liable to have further problems.  She has weight loss and is an important component of controlling these infections.  OPERATIVE NOTE:  Patient was taken to the operating room #1,  underwent general anesthesia supervised by Dr. Montez Hageman.  A time-out was held and surgical checklist run.  Her right breast and axilla were prepped with Betadine solution and sterilely draped.  I opened a 2 x 1 cm abscess under the medial aspect of her breast.  It really was not in the breast, but right below it.  In the right axilla, I opened an area, which was 2 x 7 cm.  She has sort of multiple low abscess pockets, which I kind of included in this drainage. She also had chronically thick skin consistent with her hidradenitis.  I packed both of these with Betadine solution and sterilely dressed the incisions.   Since it is about 1 o'clock in the morning, I will keep her in the hospital until the AM.  Dr. Harlow Asa, who is our surgeon at [facility] this week, will see her and decide on discharge.  The patient understands that she will have followup through Dr. [name] and our office.   Fenton Malling. Lucia Gaskins, M.D., FACS   DHN/MEDQ  D:  01/31/2012  T:  01/31/2012  Job:  [id number]  cc:   Michel Bickers, M.D. Fax: [phone]

## 2012-01-31 NOTE — Progress Notes (Signed)
Day of Surgery  Subjective: She did well last pm and today.  I removed dressings and will teach her how to do wet to dry dressings at home.  Objective: Vital signs in last 24 hours: Temp:  [97.5 F (36.4 C)-100.1 F (37.8 C)] 98.2 F (36.8 C) (04/16 1000) Pulse Rate:  [68-86] 76  (04/16 1000) Resp:  [18-20] 19  (04/16 1000) BP: (134-163)/(73-98) 150/74 mmHg (04/16 1000) SpO2:  [95 %-100 %] 97 % (04/16 1000) Weight:  [107.049 kg (236 lb)-108.954 kg (240 lb 3.2 oz)] 107.049 kg (236 lb) (04/15 1654) Last BM Date: 01/30/12 Afebrile, VSS,  Intake/Output from previous day: 04/15 0701 - 04/16 0700 In: 500 [I.V.:500] Out: 460 [Urine:450; Blood:10] Intake/Output this shift: Total I/O In: -  Out: 550 [Urine:550]  General appearance: alert, cooperative and no distress Incision/Wound: Clean and no purulence.  Will let her shower then redress wet to dry.  Home this afternoon.  Lab Results:   [facility] 01/30/12 1730  WBC 9.2  HGB 12.3  HCT 37.3  PLT 383    BMET  Basename 01/30/12 1750  NA 134*  K 4.9  CL 98  CO2 23  GLUCOSE 82  BUN 10  CREATININE 0.67  CALCIUM 9.4   PT/INR No results found for this basename: LABPROT:2,INR:2 in the last 72 hours  No results found for this basename: AST:5,ALT:5,ALKPHOS:5,BILITOT:5,PROT:5,ALBUMIN:5 in the last 168 hours   Lipase     Component Value Date/Time   LIPASE 19 01/23/2008 1418     Studies/Results: No results found.  Medications:    . ampicillin-sulbactam (UNASYN) IV  3 g Intravenous Q6H  . efavirenz-emtrictabine-tenofovir  1 tablet Oral QHS  . HYDROmorphone      . levothyroxine  125 mcg Oral QAC breakfast  . loratadine  10 mg Oral Daily  . mupirocin ointment      . vancomycin  2,000 mg Intravenous To SSTC  . vancomycin  1,000 mg Intravenous Q8H  . DISCONTD: albuterol  2 puff Inhalation Q6H  . DISCONTD: heparin  5,000 Units Subcutaneous Q8H  . DISCONTD: lip balm  1 application Topical BID  . DISCONTD: naproxen  500 mg  Oral BID WC  . DISCONTD: piperacillin-tazobactam (ZOSYN)  IV  3.375 g Intravenous Q8H  . DISCONTD: vancomycin  1,000 mg Intravenous Q8H    Assessment/Plan Hidradenitis of right breast and right axilla with focal abscesses. S/p IRRIGATION AND DEBRIDEMENT ABSCESS below right breast (2 x 1 cm) and right axillar (7 x 2 cm)01/31/12 Dr. Lucia Gaskins.  Patient Active Problem List  Diagnoses  . HIV DISEASE  . SARCOIDOSIS  . THYROID NODULE, RIGHT  . GRAVE'S DISEASE  . TOBACCO USER  . DEPRESSION  . CLASS 1-EXOPHTHALMOS-THYROTOXIC  . HYPERTENSION  . URI  . ASTHMA  . GERD  . BOILS, RECURRENT  . ANKLE PAIN, LEFT  . HEADACHE  . ABDOMINAL PAIN  . Postsurgical hypothyroidism  . Night sweats  . 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 Chest wall abscess near inframammary fold    Plan:  Wet to dry dressings after shower couple times per day.  Augmentin for 2 weeks follow up in office with Dr. Lucia Gaskins.  LOS: 1 day    Franky Reier 01/31/2012

## 2012-01-31 NOTE — Progress Notes (Signed)
Pt discharged to home provided discharge instructions and prescriptions along with handouts. Pt verbalized understanding of discharge information. Provided pt dose of antibiotic before discharge Pt stable. Pt transported by tech IV removed and documented. Levy Sjogren, RN

## 2012-01-31 NOTE — Discharge Summary (Signed)
[facility] - Attending  Per Dr. Pollie Friar plans.  Discharge home today.  Earnstine Regal, MD, [facility] Office: [phone]

## 2012-01-31 NOTE — Preoperative (Signed)
Beta Blockers   Reason not to administer Beta Blockers:Not Applicable 

## 2012-01-31 NOTE — Brief Op Note (Signed)
01/30/2012 - 01/31/2012  12:54 AM  PATIENT:  [name], 46 y.o., female, MRN: [id number]  PREOP DIAGNOSIS:  Abscess right axilla and below right breast in the face of hidradenitis.  POSTOP DIAGNOSIS:   Hidradenitis of right breast and right axilla with focal abscesses.  PROCEDURE:   Procedure(s): IRRIGATION AND DEBRIDEMENT ABSCESS below right breast (2 x 1 cm) and right axillar (7 x 2 cm)  SURGEON:   Alphonsa Overall, M.D.  ASSISTANT:   None  ANESTHESIA:   general  [name] - CRNA  General  EBL:  75 cc  BLOOD ADMINISTERED: none  DRAINS: none   LOCAL MEDICATIONS USED:   30 cc 1/4% marcaine  SPECIMEN:   Cultures obtainded  COUNTS CORRECT:  YES  INDICATIONS FOR PROCEDURE:  [name] is a 46 y.o. (DOB: [birth year]) AA female whose primary care physician is Michel Bickers, MD, MD and comes for I&D of an abscess below the right breast and in the right axilla.  The patient has hidradenitis.  She underwent a I&D in the right inframammary fold by Dr. Jacinto Reap. Thompson in 2003.  She is also HIV positive.   The indications and risks of the surgery were explained to the patient.  The risks include, but are not limited to, infection, bleeding, and nerve injury.  Note dictated to:   [id number]

## 2012-01-31 NOTE — Progress Notes (Signed)
[facility] - Attending  Per Dr. Pollie Friar plans.  Discharge home today.  Earnstine Regal, MD, [facility] Office: [phone]

## 2012-01-31 NOTE — Discharge Summary (Signed)
Physician Discharge Summary  Patient ID: [name] MRN: [id number] DOB/AGE: [birth year] 44 y.o.  Admit date: 01/30/2012 Discharge date: 01/31/2012  Admission Diagnoses: Hidradenitis of right breast and right axilla with focal abscesses Patient Active Problem List  Diagnoses  . HIV DISEASE  . SARCOIDOSIS  . THYROID NODULE, RIGHT  . GRAVE'S DISEASE  . TOBACCO USER  . DEPRESSION  . CLASS 1-EXOPHTHALMOS-THYROTOXIC  . HYPERTENSION  . URI  . ASTHMA  . GERD  . BOILS, RECURRENT  . ANKLE PAIN, LEFT  . HEADACHE  . ABDOMINAL PAIN  . Postsurgical hypothyroidism  . Night sweats  . 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 Chest wall abscess near inframammary fold     Discharge Diagnoses: Same Active Problems:  * No active hospital problems. *    PROCEDURES: S/p IRRIGATION AND DEBRIDEMENT ABSCESS below right breast (2 x 1 cm) and right axillar (7 x 2 cm)01/31/12 Dr. Lucia Gaskins.    Hospital Course: Patient is a morbidly obese and pleasant immunosuppressed female. She's had a history of infections in both armpits when she was a teenager for many years. Was recommended to consider surgery. The infections resolved. She has not needed any new treatments for a while.  She does have a history of skin infections under both breast inframammary folds on the chest wall. She had breast reduction surgery done at [facility]. Not had any recent infection and recurrent infections. However, she started getting pain and drainage in her chest wall near the right medial inframammary fold. It closes and reopens. That's been going on for a few weeks. She saw her infectious disease specialist. He was concerned and recommended incision and drainage by surgery last week.  Since that time, she started getting severe pain in her right armpit. Over the past few days, the areas become very red and tender, especially anteriorly. Now it has begun to open up and is draining foul drainage. No problems with the  left axilla or groins. She does not recall any history of MRSA. Pt was sent from the office to [facility] and underwent surgery early AM 01/31/12.  She has done well.  Sites look good.  We are sending her home on 2 weeks of augmentin.  She can shower then do wet to dry dressing changes BID. Follow up: 1 week DR. Newman;  1-2 weeks Dr. [name] [facility] clinic  Condition on D/c:  Improved  Disposition:   Discharge Orders    Future Appointments: Provider: Department: Dept Phone: Center:   02/23/2012 3:00 PM Doren Custard, CNM [facility] [phone] [place]     Medication List  As of 01/31/2012 11:45 AM   TAKE these medications         acetaminophen 500 MG tablet   Commonly known as: TYLENOL   Take 500 mg by mouth every 6 (six) hours as needed. For pain relief      albuterol 108 (90 BASE) MCG/ACT inhaler   Commonly known as: PROVENTIL HFA;VENTOLIN HFA   Inhale 2 puffs into the lungs 4 (four) times daily as needed. For shortness of breath      amoxicillin-clavulanate 875-125 MG per tablet   Commonly known as: AUGMENTIN   Take 1 tablet by mouth every 12 (twelve) hours.      ARTIFICIAL TEARS ophthalmic solution   Place 2 drops into both eyes every 4 (four) hours as needed. For dry eyes      ATRIPLA [id number] MG per tablet   Generic drug: efavirenz-emtrictabine-tenofovir  take 1 tablet by mouth at bedtime      hydrochlorothiazide 12.5 MG capsule   Commonly known as: MICROZIDE   Take 1 capsule (12.5 mg total) by mouth daily.      HYDROcodone-acetaminophen 5-325 MG per tablet   Commonly known as: NORCO   Take 1-2 tablets by mouth every 4 (four) hours as needed.      levothyroxine 125 MCG tablet   Commonly known as: SYNTHROID, LEVOTHROID   Take 1 tablet (125 mcg total) by mouth daily.      loratadine 10 MG tablet   Commonly known as: CLARITIN   Take 1 tablet (10 mg total) by mouth daily.           Follow-up Information    Follow up with [facility], MD. Schedule an  appointment as soon as possible for a visit in 1 week.   Contact information:   [facility] [address] [place] [phone]       Schedule an appointment as soon as possible for a visit with Michel Bickers, MD. (make an appointment next 1-2 weeks)    Contact information:   [place] [phone]          Signed: Earnstine Regal 01/31/2012, 11:45 AM

## 2012-01-31 NOTE — Progress Notes (Signed)
   CARE MANAGEMENT NOTE 01/31/2012  Patient:  [name], [name]   Account Number:  [id number]  Date Initiated:  01/31/2012  Documentation initiated by:  Dessa Phi  Subjective/Objective Assessment:   ADMITTED W/R AXILLA PAIN, & CHEST PAIN.     Action/Plan:   FROM HOME   Anticipated DC Date:  01/31/2012   Anticipated DC Plan:  HOME/SELF CARE         Choice offered to / List presented to:             Status of service:  Completed, signed off Medicare Important Message given?   (If response is "NO", the following Medicare IM given date fields will be blank) Date Medicare IM given:   Date Additional Medicare IM given:    Discharge Disposition:  HOME/SELF CARE  Per UR Regulation:  Reviewed for med. necessity/level of care/duration of stay  If discussed at [place] of Stay Meetings, dates discussed:    Comments:  01/31/12 [facility] RN,BSN NCM [id number]

## 2012-02-02 LAB — CULTURE, ROUTINE-ABSCESS

## 2012-02-04 LAB — ANAEROBIC CULTURE: Gram Stain: NONE SEEN

## 2012-02-06 ENCOUNTER — Encounter: Payer: Medicaid Other | Admitting: Advanced Practice Midwife

## 2012-02-09 ENCOUNTER — Encounter: Payer: Self-pay | Admitting: Internal Medicine

## 2012-02-09 ENCOUNTER — Ambulatory Visit (INDEPENDENT_AMBULATORY_CARE_PROVIDER_SITE_OTHER): Payer: Medicaid Other | Admitting: Internal Medicine

## 2012-02-09 VITALS — BP 137/87 | HR 90 | Temp 98.0°F | Wt 247.8 lb

## 2012-02-09 DIAGNOSIS — B20 Human immunodeficiency virus [HIV] disease: Secondary | ICD-10-CM

## 2012-02-09 DIAGNOSIS — N898 Other specified noninflammatory disorders of vagina: Secondary | ICD-10-CM | POA: Insufficient documentation

## 2012-02-09 LAB — COMPREHENSIVE METABOLIC PANEL
ALT: 34 U/L (ref 0–35)
Albumin: 4 g/dL (ref 3.5–5.2)
CO2: 26 mEq/L (ref 19–32)
Calcium: 9.5 mg/dL (ref 8.4–10.5)
Chloride: 104 mEq/L (ref 96–112)
Sodium: 138 mEq/L (ref 135–145)
Total Protein: 7.8 g/dL (ref 6.0–8.3)

## 2012-02-09 LAB — LIPID PANEL: Cholesterol: 174 mg/dL (ref 0–200)

## 2012-02-09 LAB — CBC
MCV: 84.8 fL (ref 78.0–100.0)
Platelets: 385 10*3/uL (ref 150–400)
RBC: 4.28 MIL/uL (ref 3.87–5.11)
WBC: 8.4 10*3/uL (ref 4.0–10.5)

## 2012-02-09 MED ORDER — FLUCONAZOLE 100 MG PO TABS: 100.0000 mg | ORAL_TABLET | Freq: Every day | ORAL | Status: DC

## 2012-02-09 MED ORDER — HYDROCODONE-ACETAMINOPHEN 5-325 MG PO TABS: 1.0000 | ORAL_TABLET | ORAL | Status: AC | PRN

## 2012-02-09 NOTE — Progress Notes (Signed)
Patient ID: [name], female   DOB: 1966-07-07, 46 y.o.   MRN: [id number]  INFECTIOUS DISEASE PROGRESS NOTE       Subjective: [name] is in for her routine visit. She denies missing any of her Atripla, Synthroid or other medications. She has been extremely tired recently and felt that it might be related to her mothers recent cancer treatments in the stress she has been under. She was in to see my partner, [name], last month and her TSH was found to be quite elevated at 13. She has not been back to see [name] recently.  She developed more boils under her right breast and axilla and underwent incision and drainage by [name] recently. Operative Gram stain and cultures were negative. She has been on Augmentin since the procedure. She has developed a pruritic vaginal discharge and some mild diarrhea. She has not had any fever, nausea or vomiting.  After her last visit with me 6 months ago she did stop smoking cigarettes for 2 months but then restarted. She states that she still has a strong desire to quit.  Objective: Temp: 98 F (36.7 C) (04/25 1500) Temp src: Oral (04/25 1500) BP: 137/87 mmHg (04/25 1500) Pulse Rate: 90  (04/25 1500)  General: She is in good spirits as usual Skin: The surgical incision under her right breast is about 5 mm in diameter with good pink granulation tissue. The incision her right axilla also shows good pink granulation tissue. Lungs: Clear Cor: Regular S1 and S2 no murmurs Abdomen: Obese but soft and nontender  Lab Results HIV 1 RNA Quant (copies/mL)  Date Value  07/25/2011 <20   02/14/2011 <20   10/25/2010 <20 copies/mL      CD4 T Cell Abs (cmm)  Date Value  07/25/2011 790   02/14/2011 1110   10/25/2010 740      Assessment: I will continue her Atripla and repeat CD4 and viral load today.  Her recent boils appeared to be resolved and I will have her stop the Augmentin. I will give her 3 days of fluconazole for her vaginal discharge  and I asked her to call me if her diarrhea is not improving soon. If it doesn't I will check a C. difficile PCR.  I encouraged her to stay with her efforts to quit smoking cigarettes.  I asked her to make an appointment to see [name], her endocrinologist, as soon as possible.  Plan: 1. Continue Atripla 2. Discontinue Augmentin 3. Fluconazole 100 mg daily for 3 days 4. Refill hydrocodone acetaminophen 5-325, #40 with no refills 5. Cigarette cessation counseling 6. Followup with [name] as soon as possible    Felicia Bickers, MD [facility] for [place] pager   [phone] cell 02/09/2012, 3:21 PM

## 2012-02-10 LAB — T-HELPER CELL (CD4) - (RCID CLINIC ONLY)
CD4 % Helper T Cell: 35 % (ref 33–55)
CD4 T Cell Abs: 1040 uL (ref 400–2700)

## 2012-02-13 ENCOUNTER — Encounter (HOSPITAL_COMMUNITY): Payer: Self-pay | Admitting: Surgery

## 2012-02-16 ENCOUNTER — Encounter: Payer: Self-pay | Admitting: Infectious Disease

## 2012-02-16 ENCOUNTER — Telehealth: Payer: Self-pay | Admitting: *Deleted

## 2012-02-16 ENCOUNTER — Ambulatory Visit (INDEPENDENT_AMBULATORY_CARE_PROVIDER_SITE_OTHER): Payer: Medicaid Other | Admitting: Infectious Disease

## 2012-02-16 VITALS — BP 144/87 | HR 76 | Temp 97.6°F | Wt 250.0 lb

## 2012-02-16 DIAGNOSIS — R21 Rash and other nonspecific skin eruption: Secondary | ICD-10-CM

## 2012-02-16 DIAGNOSIS — E890 Postprocedural hypothyroidism: Secondary | ICD-10-CM

## 2012-02-16 DIAGNOSIS — L0291 Cutaneous abscess, unspecified: Secondary | ICD-10-CM

## 2012-02-16 DIAGNOSIS — L0390 Cellulitis, unspecified: Secondary | ICD-10-CM

## 2012-02-16 DIAGNOSIS — B20 Human immunodeficiency virus [HIV] disease: Secondary | ICD-10-CM

## 2012-02-16 DIAGNOSIS — L732 Hidradenitis suppurativa: Secondary | ICD-10-CM

## 2012-02-16 HISTORY — DX: Rash and other nonspecific skin eruption: R21

## 2012-02-16 MED ORDER — PREDNISONE 20 MG PO TABS: 40.0000 mg | ORAL_TABLET | Freq: Every day | ORAL | Status: AC

## 2012-02-16 MED ORDER — HYDROXYZINE HCL 25 MG PO TABS: 25.0000 mg | ORAL_TABLET | Freq: Four times a day (QID) | ORAL | Status: AC | PRN

## 2012-02-16 MED ORDER — FEXOFENADINE HCL 180 MG PO TABS: 180.0000 mg | ORAL_TABLET | Freq: Every day | ORAL | Status: DC

## 2012-02-16 NOTE — Progress Notes (Signed)
Subjective:    Patient ID: [name], female    DOB: 05-24-1966, 46 y.o.   MRN: [id number]  HPI  46 year old African American lady with HIV, Hidradenitis suppurativa with axillary abscess sp I and D by Dr. Johney Maine who presents with intense urticaria and pruritis with rash on face, back, and arms. She had attributed this to exposure to pollen dust at er house. On closer questioning she has intense pruritis around the tape of her bandage and it would appear she may have an allergic rxn to the tape. Her viral load is perfectly supprsed and her wound appears heatlhy.   Review of Systems  Constitutional: Negative for fever, chills, diaphoresis, activity change, appetite change, fatigue and unexpected weight change.  HENT: Negative for congestion, sore throat, rhinorrhea, sneezing, trouble swallowing and sinus pressure.   Eyes: Negative for photophobia and visual disturbance.  Respiratory: Negative for cough, chest tightness, shortness of breath, wheezing and stridor.   Cardiovascular: Negative for chest pain, palpitations and leg swelling.  Gastrointestinal: Negative for nausea, vomiting, abdominal pain, diarrhea, constipation, blood in stool, abdominal distention and anal bleeding.  Genitourinary: Negative for dysuria, hematuria, flank pain and difficulty urinating.  Musculoskeletal: Negative for myalgias, back pain, joint swelling, arthralgias and gait problem.  Skin: Positive for rash and wound. Negative for color change and pallor.  Neurological: Negative for dizziness, tremors, weakness and light-headedness.  Hematological: Negative for adenopathy. Does not bruise/bleed easily.  Psychiatric/Behavioral: Negative for behavioral problems, confusion, sleep disturbance, dysphoric mood, decreased concentration and agitation.       Objective:   Physical Exam  Constitutional: She is oriented to person, place, and time. She appears well-developed and well-nourished. No distress.  HENT:  Head:  Normocephalic and atraumatic.  Mouth/Throat: Oropharynx is clear and moist. No oropharyngeal exudate.  Eyes: Conjunctivae and EOM are normal. Pupils are equal, round, and reactive to light. No scleral icterus.  Neck: Normal range of motion. Neck supple. No JVD present.  Cardiovascular: Normal rate, regular rhythm and normal heart sounds.  Exam reveals no gallop and no friction rub.   No murmur heard. Pulmonary/Chest: Effort normal and breath sounds normal. No respiratory distress. She has no wheezes. She has no rales. She exhibits no tenderness.    Abdominal: She exhibits no distension and no mass. There is no tenderness. There is no rebound and no guarding.  Musculoskeletal: She exhibits no edema and no tenderness.  Lymphadenopathy:    She has no cervical adenopathy.  Neurological: She is alert and oriented to person, place, and time. She has normal reflexes. She exhibits normal muscle tone. Coordination normal.  Skin: Skin is warm and dry. Rash noted. She is not diaphoretic. There is erythema. No pallor.       Diffuse urticarial rash involving back, trunk face, arms  Psychiatric: She has a normal mood and affect. Her behavior is normal. Judgment and thought content normal.          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 Pt to see Dr Loanne Drilling and I will cc him this note as well  Rash I wonder if this may indeed be an allergic rxn triggered by the adhesive tape. Removed tape applied paper adhesive. Giving allegra, atarax and 3 day course of prednisone  Abscess Sp I and D by Dr. Johney Maine. Healing  well

## 2012-02-16 NOTE — Assessment & Plan Note (Signed)
Sp I and D by Dr. Johney Maine. Healing well

## 2012-02-16 NOTE — Telephone Encounter (Signed)
Patient called and advised she is having an allergic reaction to something. She describes a rash on her face, periodic swelling and whelps on her back and arms that are itchy. She has tried Benadryl and it helps her sleep but she wakes up itching. She also has tried Claritin and it gives her migraines. She thinks it may be from environmental allergies but is not sure and she is very uncomfortable and wants to see the doctor. This has all been going on since Sunday 02/12/12. Gave her an appt with Dr Tommy Medal today at [place].

## 2012-02-16 NOTE — Assessment & Plan Note (Signed)
Perfect atripla

## 2012-02-16 NOTE — Assessment & Plan Note (Signed)
Could in future consider oral contraceptive therapy, aldactone, dietary modifications

## 2012-02-16 NOTE — Assessment & Plan Note (Signed)
I wonder if this may indeed be an allergic rxn triggered by the adhesive tape. Removed tape applied paper adhesive. Giving allegra, atarax and 3 day course of prednisone

## 2012-02-16 NOTE — Assessment & Plan Note (Signed)
Her TSH was 13 on  130mcg of synthroid. Pt to see Dr Loanne Drilling and I will cc him this note as well

## 2012-02-17 ENCOUNTER — Other Ambulatory Visit: Payer: Self-pay | Admitting: *Deleted

## 2012-02-17 DIAGNOSIS — L270 Generalized skin eruption due to drugs and medicaments taken internally: Secondary | ICD-10-CM

## 2012-02-17 MED ORDER — CETIRIZINE HCL 10 MG PO TABS: 10.0000 mg | ORAL_TABLET | Freq: Every day | ORAL | Status: DC

## 2012-02-20 ENCOUNTER — Ambulatory Visit: Payer: Medicaid Other | Admitting: Endocrinology

## 2012-02-20 DIAGNOSIS — Z0289 Encounter for other administrative examinations: Secondary | ICD-10-CM

## 2012-02-21 ENCOUNTER — Ambulatory Visit (INDEPENDENT_AMBULATORY_CARE_PROVIDER_SITE_OTHER): Payer: Medicaid Other | Admitting: Endocrinology

## 2012-02-21 ENCOUNTER — Encounter: Payer: Self-pay | Admitting: Endocrinology

## 2012-02-21 VITALS — BP 134/92 | HR 67 | Temp 97.7°F | Ht 65.0 in | Wt 253.0 lb

## 2012-02-21 DIAGNOSIS — E890 Postprocedural hypothyroidism: Secondary | ICD-10-CM

## 2012-02-21 MED ORDER — LEVOTHYROXINE SODIUM 150 MCG PO TABS: 150.0000 ug | ORAL_TABLET | Freq: Every day | ORAL | Status: DC

## 2012-02-21 NOTE — Progress Notes (Signed)
Subjective:    Patient ID: [name], female    DOB: January 20, 1966, 46 y.o.   MRN: [id number]  HPI Pt was seen at ent in 2007 for ear infection.  She was incidentally noted to have a thyroid nodule.  bx in 2008 was suspicious, so she had a thyroidect in 2009.  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  She says she never misses the synthroid.  pt states she feels well in general, except for weight gain.   Past Medical History  Diagnosis Date  . Hyperthyroidism 08/2006    Grave's Disease -diffuse radiotracer uptake 08/25/06 Thyroid scan-Cold nodule to R lower lobe of thyrorid  . HIV DISEASE 07/24/2006    dx March 05  . Sarcoidosis 02/08/2007    dx as a teenager in [place] from abnl CXR. Completed 2 yrs Prednisone after lung bx confirmation. No symptoms since then.  . THYROID NODULE, RIGHT 02/08/2007  . GRAVE'S DISEASE 01/01/2008  . DEPRESSION 07/24/2006  . 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 HYPERTENSION 07/24/2006  . ASTHMA 07/24/2006  . GERD 07/24/2006  . 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 Anemia     Normocytic  . History of hidradenitis suppurativa   . Gastroenteritis 07/10/07  . Menometrorrhagia     hx of  . History of thrush     Past Surgical History  Procedure Date  . Breast surgery 1997    Breast Reduction   . Total thyroidectomy 2010  . Sarco 1994  . Dilation and curettage of uterus Feb 2004    s/p for 1st trimester nonviable pregnancy  . Incision and drainage peritonsillar abscess Mar 03  . Incise and drain abcess Nov 03    s/p I &D for righ inframmary fold hidradenitis  . Irrigation and debridement abscess 01/31/2012    Procedure: IRRIGATION AND DEBRIDEMENT ABSCESS;  Surgeon: Shann Medal, MD;  Location: [facility];  Service: General;  Laterality: Right;  right breast and axilla     History   Social History  . Marital Status: Single    Spouse Name: N/A    Number of Children: 1  . Years of Education: N/A   Occupational History  . [employer]    Social History Main Topics  . Smoking status: Current Everyday Smoker -- 0.5 packs/day for 15 years    Types: Cigarettes  . Smokeless tobacco: Never Used  . Alcohol Use: No  . Drug Use: No  . Sexually Active: Not Currently     declined condoms   Other Topics Concern  . Not on file   Social History Narrative  . No narrative on file    Current Outpatient Prescriptions on File Prior to Visit  Medication Sig Dispense Refill  . acetaminophen (TYLENOL) 500 MG tablet Take 500 mg by mouth every 6 (six) hours as needed. For pain relief      . albuterol (PROVENTIL HFA;VENTOLIN HFA) 108 (90 BASE) MCG/ACT inhaler Inhale 2 puffs into the lungs 4 (four) times daily as needed. For shortness of breath      . ARTIFICIAL TEARS ophthalmic solution Place 2 drops into both eyes every 4 (four) hours as needed. For dry eyes      . ATRIPLA 600-200-300 MG per tablet take 1 tablet by mouth at bedtime  30 tablet  10  . cetirizine (ZYRTEC) 10 MG tablet Take 1 tablet (10 mg total) by mouth daily.  30 tablet  6  . fluconazole (DIFLUCAN) 100 MG tablet Take 1 tablet (100 mg total)  by mouth daily.  3 tablet  0  . hydrochlorothiazide (MICROZIDE) 12.5 MG capsule Take 1 capsule (12.5 mg total) by mouth daily.  30 capsule  11  . hydrOXYzine (ATARAX/VISTARIL) 25 MG tablet Take 1 tablet (25 mg total) by mouth every 6 (six) hours as needed for itching.  120 tablet  2    Allergies  Allergen Reactions  . Tape Rash    Rash   . Lisinopril Cough    Family History  Problem Relation Age of Onset  . Cancer Other     FH of Breast Cancer  . Cancer Other     Fh of Lung Cancer  . Heart disease Other   . Hypertension Other   . Stroke Other     Grandparent  . Kidney disease Other     Grandparent  . Diabetes Other     FH of Diabetes  . Hypertension Mother   . Cancer Mother     throat  . Hypertension Father   . Hypertension Sister   . Cancer Maternal Uncle     Stomach Cancer    BP 134/92  Pulse 67   Temp(Src) 97.7 F (36.5 C) (Oral)  Ht 5\' 5"  (1.651 m)  Wt 253 lb (114.76 kg)  BMI 42.10 kg/m2  SpO2 99%  Review of Systems She attributes depression to mother's cancer.    Objective:   Physical Exam VITAL SIGNS:  See vs page GENERAL: no distress Neck: a healed scar is present.  i do not appreciate a nodule in the thyroid or elsewhere in the neck  Lab Results  Component Value Date   TSH 13.553* 12/27/2011      Assessment & Plan:  Postsurgical hypothyroidism, needs increased rx

## 2012-02-21 NOTE — Patient Instructions (Addendum)
Increase levothyroxine to 150 mcg/day. Please make a follow-up appointment in 6 weeks. When you return, we may adjust the amount of the levothyroxine, but you will need the medication for the rest of your life.

## 2012-02-23 ENCOUNTER — Ambulatory Visit (INDEPENDENT_AMBULATORY_CARE_PROVIDER_SITE_OTHER): Payer: Medicaid Other | Admitting: Physician Assistant

## 2012-02-23 ENCOUNTER — Encounter: Payer: Self-pay | Admitting: Physician Assistant

## 2012-02-23 VITALS — BP 140/90 | HR 84 | Temp 97.2°F | Ht 64.0 in | Wt 247.8 lb

## 2012-02-23 DIAGNOSIS — N920 Excessive and frequent menstruation with regular cycle: Secondary | ICD-10-CM

## 2012-02-23 NOTE — Patient Instructions (Addendum)
Dysfunctional Uterine Bleeding Normally, menstrual periods begin between ages 11 to 17 in young women. A normal menstrual cycle/period may begin every 23 days up to 35 days and lasts from 1 to 7 days. Around 12 to 14 days before your menstrual period starts, ovulation (ovary produces an egg) occurs. When counting the time between menstrual periods, count from the first day of bleeding of the previous period to the first day of bleeding of the next period. Dysfunctional (abnormal) uterine bleeding is bleeding that is different from a normal menstrual period. Your periods may come earlier or later than usual. They may be lighter, have blood clots or be heavier. You may have bleeding between periods, or you may skip one period or more. You may have bleeding after sexual intercourse, bleeding after menopause, or no menstrual period. CAUSES   Pregnancy (normal, miscarriage, tubal).   IUDs (intrauterine device, birth control).   Birth control pills.   Hormone treatment.   Menopause.   Infection of the cervix.   Blood clotting problems.   Infection of the inside lining of the uterus.   Endometriosis, inside lining of the uterus growing in the pelvis and other female organs.   Adhesions (scar tissue) inside the uterus.   Obesity or severe weight loss.   Uterine polyps inside the uterus.   Cancer of the vagina, cervix, or uterus.   Ovarian cysts or polycystic ovary syndrome.   Medical problems (diabetes, thyroid disease).   Uterine fibroids (noncancerous tumor).   Problems with your female hormones.   Endometrial hyperplasia, very thick lining and enlarged cells inside of the uterus.   Medicines that interfere with ovulation.   Radiation to the pelvis or abdomen.   Chemotherapy.  DIAGNOSIS   Your doctor will discuss the history of your menstrual periods, medicines you are taking, changes in your weight, stress in your life, and any medical problems you may have.   Your doctor  will do a physical and pelvic examination.   Your doctor may want to perform certain tests to make a diagnosis, such as:   Pap test.   Blood tests.   Cultures for infection.   CT scan.   Ultrasound.   Hysteroscopy.   Laparoscopy.   MRI.   Hysterosalpingography.   D and C.   Endometrial biopsy.  TREATMENT  Treatment will depend on the cause of the dysfunctional uterine bleeding (DUB). Treatment may include:  Observing your menstrual periods for a couple of months.   Prescribing medicines for medical problems, including:   Antibiotics.   Hormones.   Birth control pills.   Removing an IUD (intrauterine device, birth control).   Surgery:   D and C (scrape and remove tissue from inside the uterus).   Laparoscopy (examine inside the abdomen with a lighted tube).   Uterine ablation (destroy lining of the uterus with electrical current, laser, heat, or freezing).   Hysteroscopy (examine cervix and uterus with a lighted tube).   Hysterectomy (remove the uterus).  HOME CARE INSTRUCTIONS   If medicines were prescribed, take exactly as directed. Do not change or switch medicines without consulting your caregiver.   Long term heavy bleeding may result in iron deficiency. Your caregiver may have prescribed iron pills. They help replace the iron that your body lost from heavy bleeding. Take exactly as directed.   Do not take aspirin or medicines that contain aspirin one week before or during your menstrual period. Aspirin may make the bleeding worse.   If you need   to change your sanitary pad or tampon more than once every 2 hours, stay in bed with your feet elevated and a cold pack on your lower abdomen. Rest as much as possible, until the bleeding stops or slows down.   Eat well-balanced meals. Eat foods high in iron. Examples are:   Leafy green vegetables.   Whole-grain breads and cereals.   Eggs.   Meat.   Liver.   Do not try to lose weight until the  abnormal bleeding has stopped and your blood iron level is back to normal. Do not lift more than ten pounds or do strenuous activities when you are bleeding.   For a couple of months, make note on your calendar, marking the start and ending of your period, and the type of bleeding (light, medium, heavy, spotting, clots or missed periods). This is for your caregiver to better evaluate your problem.  SEEK MEDICAL CARE IF:   You develop nausea (feeling sick to your stomach) and vomiting, dizziness, or diarrhea while you are taking your medicine.   You are getting lightheaded or weak.   You have any problems that may be related to the medicine you are taking.   You develop pain with your DUB.   You want to remove your IUD.   You want to stop or change your birth control pills or hormones.   You have any type of abnormal bleeding mentioned above.   You are over 64 years old and have not had a menstrual period yet.   You are 46 years old and you are still having menstrual periods.   You have any of the symptoms mentioned above.   You develop a rash.  SEEK IMMEDIATE MEDICAL CARE IF:   An oral temperature above 102 F (38.9 C) develops.   You develop chills.   You are changing your sanitary pad or tampon more than once an hour.   You develop abdominal pain.   You pass out or faint.  Document Released: 09/30/2000 Document Revised: 09/22/2011 Document Reviewed: 09/01/2009 [facility] Patient Information 2012 [place]. Endometrial Biopsy This is a test in which a tissue sample (a biopsy) is taken from inside the uterus (womb). It is then looked at by a specialist under a microscope to see if the tissue is normal or abnormal. The endometrium is the lining of the uterus. This test helps determine where you are in your menstrual cycle and how hormone levels are affecting the lining of the uterus. Another use for this test is to diagnose endometrial cancer, tuberculosis, polyps, or  inflammatory conditions and to evaluate uterine bleeding. PREPARATION FOR TEST No preparation or fasting is necessary. NORMAL FINDINGS No pathologic conditions. Presence of "secretory-type" endometrium 3 to 5 days before to normal menstruation. Ranges for normal findings may vary among different laboratories and hospitals. You should always check with your doctor after having lab work or other tests done to discuss the meaning of your test results and whether your values are considered within normal limits. MEANING OF TEST  Your caregiver will go over the test results with you and discuss the importance and meaning of your results, as well as treatment options and the need for additional tests if necessary. OBTAINING THE TEST RESULTS It is your responsibility to obtain your test results. Ask the lab or department performing the test when and how you will get your results. Document Released: 02/03/2005 Document Revised: 09/22/2011 Document Reviewed: 09/12/2008 [facility] Patient Information 2012 [place].

## 2012-02-23 NOTE — Progress Notes (Signed)
Chief Complaint:  Referral, Night Sweats and Headache   [name] is  46 y.o. No obstetric history on file..  Patient's last menstrual period was 02/07/2012..  She presents complaining of Referral, Night Sweats and Headache . Onset is described as sudden and has been present for  1 months. Referred from Dr. [name] (ID) for further evaluation. Reports seeing Endocrinology on Monday and Synthroid dosage was increased based on recent labs.  Obstetrical/Gynecological History: Pertinent Gynecological History: Menses: flow is excessive with use of 12 pads or tampons on heaviest days, regular every 14 days without intermenstrual spotting, usually lasting 4 to  days and with minimal cramping No current bleeding. Bleeding: dysfunctional uterine bleeding Contraception: abstinence Sexually transmitted diseases: HIV dx 2005  Past Medical History: Past Medical History  Diagnosis Date  . Hyperthyroidism 08/2006    Grave's Disease -diffuse radiotracer uptake 08/25/06 Thyroid scan-Cold nodule to R lower lobe of thyrorid  . HIV DISEASE 07/24/2006    dx March 05  . Sarcoidosis 02/08/2007    dx as a teenager in [place] from abnl CXR. Completed 2 yrs Prednisone after lung bx confirmation. No symptoms since then.  . THYROID NODULE, RIGHT 02/08/2007  . GRAVE'S DISEASE 01/01/2008  . DEPRESSION 07/24/2006  . 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 HYPERTENSION 07/24/2006  . ASTHMA 07/24/2006  . GERD 07/24/2006  . 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 Anemia     Normocytic  . History of hidradenitis suppurativa   . Gastroenteritis 07/10/07  . Menometrorrhagia     hx of  . History of thrush     Past Surgical History: Past Surgical History  Procedure Date  . Breast surgery 1997    Breast Reduction   . Total thyroidectomy 2010  . Sarco 1994  . Dilation and curettage of uterus Feb 2004    s/p for 1st trimester nonviable pregnancy  . Incision and drainage peritonsillar abscess Mar 03  .  Incise and drain abcess Nov 03    s/p I &D for righ inframmary fold hidradenitis  . Irrigation and debridement abscess 01/31/2012    Procedure: IRRIGATION AND DEBRIDEMENT ABSCESS;  Surgeon: Shann Medal, MD;  Location: [facility];  Service: General;  Laterality: Right;  right breast and axilla     Family History: Family History  Problem Relation Age of Onset  . Cancer Other     FH of Breast Cancer  . Cancer Other     Fh of Lung Cancer  . Heart disease Other   . Hypertension Other   . Stroke Other     Grandparent  . Kidney disease Other     Grandparent  . Diabetes Other     FH of Diabetes  . Hypertension Mother   . Cancer Mother     throat  . Hypertension Father   . Hypertension Sister   . Cancer Maternal Uncle     Stomach Cancer    Social History: History  Substance Use Topics  . Smoking status: Current Everyday Smoker -- 0.5 packs/day for 15 years    Types: Cigarettes  . Smokeless tobacco: Never Used  . Alcohol Use: No    Allergies:  Allergies  Allergen Reactions  . Tape Rash    Rash   . Lisinopril Cough    Review of Systems - Negative except what is mentioned in HPI  Physical Exam   Blood pressure 140/90, pulse 84, temperature 97.2 F (36.2 C), temperature source Oral, height 5\' 4"  (1.626 m), weight 247 lb 12.8  oz (112.401 kg), last menstrual period 02/07/2012.  General: General appearance - alert, well appearing, and in no distress Focused Gynecological Exam: exam declined by the patient  Labs: No results found for this or any previous visit (from the past 24 hour(s)).  Imaging Studies:  No results found.   Assessment: Patient Active Problem List  Diagnoses  . HIV DISEASE  . SARCOIDOSIS  . THYROID NODULE, RIGHT  . GRAVE'S DISEASE  . TOBACCO USER  . DEPRESSION  . CLASS 1-EXOPHTHALMOS-THYROTOXIC  . HYPERTENSION  . ASTHMA  . GERD  . Postsurgical hypothyroidism  . 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 Vaginal Discharge  . Rash   . Abscess    Plan: Will obtain pelvic ultrasound Return to discuss results and possible endometrial biopsy  Rudolf Blizard E. 02/23/2012,3:48 PM

## 2012-02-24 ENCOUNTER — Ambulatory Visit (HOSPITAL_COMMUNITY)
Admission: RE | Admit: 2012-02-24 | Discharge: 2012-02-24 | Disposition: A | Discharge status: Home / Self Care | Payer: Medicaid Other | Source: Ambulatory Visit | Attending: Physician Assistant | Admitting: Physician Assistant

## 2012-02-24 DIAGNOSIS — N938 Other specified abnormal uterine and vaginal bleeding: Secondary | ICD-10-CM | POA: Insufficient documentation

## 2012-02-24 DIAGNOSIS — N920 Excessive and frequent menstruation with regular cycle: Secondary | ICD-10-CM

## 2012-02-24 DIAGNOSIS — N831 Corpus luteum cyst of ovary, unspecified side: Secondary | ICD-10-CM | POA: Insufficient documentation

## 2012-02-24 DIAGNOSIS — N949 Unspecified condition associated with female genital organs and menstrual cycle: Secondary | ICD-10-CM | POA: Insufficient documentation

## 2012-03-15 ENCOUNTER — Telehealth: Payer: Self-pay | Admitting: *Deleted

## 2012-03-15 NOTE — Telephone Encounter (Signed)
States 2 boils have come back up as of last Sunday. They are draining. Wants to know if she needs meds or referral back to surgeon who did the I & D a few months ago. I told her I will check with md to see if she needs to come in or if he will make the referral first. I told her I will call her when I have a response

## 2012-03-19 ENCOUNTER — Telehealth: Payer: Self-pay | Admitting: *Deleted

## 2012-03-19 MED ORDER — FLUCONAZOLE 100 MG PO TABS: 100.0000 mg | ORAL_TABLET | Freq: Every day | ORAL | Status: AC

## 2012-03-19 MED ORDER — AMOXICILLIN-POT CLAVULANATE 875-125 MG PO TABS: 1.0000 | ORAL_TABLET | Freq: Two times a day (BID) | ORAL | Status: AC

## 2012-03-19 NOTE — Telephone Encounter (Signed)
States she always gets a yeast infection after she takes antibiotics. Would like diflucan called to her drug store

## 2012-03-19 NOTE — Telephone Encounter (Signed)
She grew multiple organisms 3/13 so I will give her 7 days of Augmentin.

## 2012-03-19 NOTE — Telephone Encounter (Signed)
I prescribed fluconazole 100 mg daily for 3 days to be taken as needed.

## 2012-03-19 NOTE — Telephone Encounter (Signed)
I left her a message that the requested drug has been sent to her pharmacy

## 2012-03-21 ENCOUNTER — Other Ambulatory Visit: Payer: Medicaid Other | Admitting: Family Medicine

## 2012-03-30 ENCOUNTER — Other Ambulatory Visit: Payer: Self-pay | Admitting: Internal Medicine

## 2012-04-10 ENCOUNTER — Ambulatory Visit (INDEPENDENT_AMBULATORY_CARE_PROVIDER_SITE_OTHER): Payer: Medicaid Other | Admitting: Endocrinology

## 2012-04-10 ENCOUNTER — Encounter: Payer: Self-pay | Admitting: Endocrinology

## 2012-04-10 ENCOUNTER — Other Ambulatory Visit: Payer: Medicaid Other

## 2012-04-10 ENCOUNTER — Telehealth: Payer: Self-pay | Admitting: Endocrinology

## 2012-04-10 ENCOUNTER — Other Ambulatory Visit (INDEPENDENT_AMBULATORY_CARE_PROVIDER_SITE_OTHER): Payer: Medicaid Other

## 2012-04-10 VITALS — BP 138/98 | HR 77 | Temp 97.6°F | Ht 65.0 in | Wt 250.0 lb

## 2012-04-10 DIAGNOSIS — E890 Postprocedural hypothyroidism: Secondary | ICD-10-CM

## 2012-04-10 MED ORDER — LEVOTHYROXINE SODIUM 175 MCG PO TABS: 175.0000 ug | ORAL_TABLET | Freq: Every day | ORAL | Status: DC

## 2012-04-10 NOTE — Patient Instructions (Addendum)
blood tests are being requested for you today.  You will receive a letter with results. Please make a follow-up appointment in 3 months.  When you return, we may adjust the amount of the levothyroxine, but you will need the medication for the rest of your life.

## 2012-04-10 NOTE — Progress Notes (Signed)
Subjective:    Patient ID: [name], female    DOB: Sep 11, 1966, 46 y.o.   MRN: [id number]  HPI Pt was seen at [facility] in 2007 for ear infection.  She was incidentally noted to have a thyroid nodule.  bx in 2008 was suspicious, so she had a thyroidect in 2009.  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  She says she never misses the synthroid.  pt states she has fatigue.  Past Medical History  Diagnosis Date  . Hyperthyroidism 08/2006    Grave's Disease -diffuse radiotracer uptake 08/25/06 Thyroid scan-Cold nodule to R lower lobe of thyrorid  . HIV DISEASE 07/24/2006    dx March 05  . Sarcoidosis 02/08/2007    dx as a teenager in [place] from abnl CXR. Completed 2 yrs Prednisone after lung bx confirmation. No symptoms since then.  . THYROID NODULE, RIGHT 02/08/2007  . GRAVE'S DISEASE 01/01/2008  . DEPRESSION 07/24/2006  . 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 HYPERTENSION 07/24/2006  . ASTHMA 07/24/2006  . GERD 07/24/2006  . 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 Anemia     Normocytic  . History of hidradenitis suppurativa   . Gastroenteritis 07/10/07  . Menometrorrhagia     hx of  . History of thrush     Past Surgical History  Procedure Date  . Breast surgery 1997    Breast Reduction   . Total thyroidectomy 2010  . Sarco 1994  . Dilation and curettage of uterus Feb 2004    s/p for 1st trimester nonviable pregnancy  . Incision and drainage peritonsillar abscess Mar 03  . Incise and drain abcess Nov 03    s/p I &D for righ inframmary fold hidradenitis  . Irrigation and debridement abscess 01/31/2012    Procedure: IRRIGATION AND DEBRIDEMENT ABSCESS;  Surgeon: Shann Medal, MD;  Location: [facility];  Service: General;  Laterality: Right;  right breast and axilla     History   Social History  . Marital Status: Single    Spouse Name: N/A    Number of Children: 1  . Years of Education: N/A   Occupational History  . [employer]   Social History Main Topics  .  Smoking status: Current Everyday Smoker -- 0.5 packs/day for 15 years    Types: Cigarettes  . Smokeless tobacco: Never Used  . Alcohol Use: No  . Drug Use: No  . Sexually Active: Not Currently     declined condoms   Other Topics Concern  . Not on file   Social History Narrative  . No narrative on file    Current Outpatient Prescriptions on File Prior to Visit  Medication Sig Dispense Refill  . acetaminophen (TYLENOL) 500 MG tablet Take 500 mg by mouth every 6 (six) hours as needed. For pain relief      . albuterol (PROVENTIL HFA;VENTOLIN HFA) 108 (90 BASE) MCG/ACT inhaler Inhale 2 puffs into the lungs 4 (four) times daily as needed. For shortness of breath      . ARTIFICIAL TEARS ophthalmic solution Place 2 drops into both eyes every 4 (four) hours as needed. For dry eyes      . ATRIPLA 600-200-300 MG per tablet take 1 tablet by mouth at bedtime  30 tablet  10  . cetirizine (ZYRTEC) 10 MG tablet Take 1 tablet (10 mg total) by mouth daily.  30 tablet  6  . NIFEdipine (PROCARDIA XL/ADALAT-CC) 60 MG 24 hr tablet TAKE 1 TABLET (60 MG TOTAL) BY MOUTH DAILY.  30 tablet  9    Allergies  Allergen Reactions  . Tape Rash    Rash   . Lisinopril Cough    Family History  Problem Relation Age of Onset  . Cancer Other     FH of Breast Cancer  . Cancer Other     Fh of Lung Cancer  . Heart disease Other   . Hypertension Other   . Stroke Other     Grandparent  . Kidney disease Other     Grandparent  . Diabetes Other     FH of Diabetes  . Hypertension Mother   . Cancer Mother     throat  . Hypertension Father   . Hypertension Sister   . Cancer Maternal Uncle     Stomach Cancer    BP 138/98  Pulse 77  Temp 97.6 F (36.4 C) (Oral)  Ht 5\' 5"  (1.651 m)  Wt 250 lb (113.399 kg)  BMI 41.60 kg/m2  SpO2 99%  LMP 04/05/2012  Review of Systems She has weight gain    Objective:   Physical Exam VITAL SIGNS:  See vs page GENERAL: no distress Skin:  Not diaphoretic Neuro: no  tremor   Lab Results  Component Value Date   TSH 14.11* 04/10/2012      Assessment & Plan:  Post-i-131 hypothyroidism, needs increased rx

## 2012-04-10 NOTE — Telephone Encounter (Signed)
please call patient: There is no safe alternative to synthroid

## 2012-04-11 NOTE — Telephone Encounter (Signed)
Pt informed of MD's advisement. Pt wants to change to another thyroid medication other than Synthroid.

## 2012-04-11 NOTE — Telephone Encounter (Signed)
Once again, no safe alternative

## 2012-04-12 ENCOUNTER — Telehealth: Payer: Self-pay | Admitting: Internal Medicine

## 2012-04-12 NOTE — Telephone Encounter (Signed)
Caller: Lucero/Patient; PCP: Renato Shin; CB#: [phone]; ; ; Call regarding Is Awaiting Callback From Office Regarding Thyroid Medication Change.; Is currently on Synthroid and states that she has been speaking with someone from the office regarding switching. Verified in EPIC that MD states that there is no safe alternative to Synthroid. Pt was called 04/10/12 and left message to call office back. Called office, states that they will leave [place] a message to call pt back.

## 2012-04-12 NOTE — Telephone Encounter (Signed)
Please try increasing the medication.  If you take enough, your blood test will normalize.  The see how you feel

## 2012-04-12 NOTE — Telephone Encounter (Signed)
Pt states that she wants to know why the other alternatives to Synthroid are not safe for her to take. She feels that Synthroid is not helping her and is causing additional side effects and wants another medication besides Synthroid. Pt states that she has been an another medication in the past besides Synthroid.

## 2012-04-12 NOTE — Telephone Encounter (Signed)
Left message for pt to callback office.  

## 2012-04-13 NOTE — Telephone Encounter (Signed)
Pt informed of MD's advisement. 

## 2012-05-22 ENCOUNTER — Encounter: Payer: Self-pay | Admitting: Internal Medicine

## 2012-05-22 ENCOUNTER — Ambulatory Visit (INDEPENDENT_AMBULATORY_CARE_PROVIDER_SITE_OTHER): Payer: Medicaid Other | Admitting: Internal Medicine

## 2012-05-22 VITALS — BP 161/88 | HR 85 | Temp 98.2°F | Ht 65.0 in | Wt 254.5 lb

## 2012-05-22 DIAGNOSIS — Z0000 Encounter for general adult medical examination without abnormal findings: Secondary | ICD-10-CM

## 2012-05-22 DIAGNOSIS — B20 Human immunodeficiency virus [HIV] disease: Secondary | ICD-10-CM

## 2012-05-22 NOTE — Progress Notes (Signed)
Patient ID: [name], female   DOB: August 03, 1966, 46 y.o.   MRN: [id number]     [facility] for Infectious Disease  Patient Active Problem List  Diagnosis  . HIV DISEASE  . SARCOIDOSIS  . THYROID NODULE, RIGHT  . GRAVE'S DISEASE  . TOBACCO USER  . DEPRESSION  . CLASS 1-EXOPHTHALMOS-THYROTOXIC  . HYPERTENSION  . ASTHMA  . GERD  . Postsurgical hypothyroidism  . 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 Vaginal Discharge  . Rash  . Abscess    Patient's Medications  New Prescriptions   No medications on file  Previous Medications   ACETAMINOPHEN (TYLENOL) 500 MG TABLET    Take 500 mg by mouth every 6 (six) hours as needed. For pain relief   ALBUTEROL (PROVENTIL HFA;VENTOLIN HFA) 108 (90 BASE) MCG/ACT INHALER    Inhale 2 puffs into the lungs 4 (four) times daily as needed. For shortness of breath   ARTIFICIAL TEARS OPHTHALMIC SOLUTION    Place 2 drops into both eyes every 4 (four) hours as needed. For dry eyes   ATRIPLA 600-200-300 MG PER TABLET    take 1 tablet by mouth at bedtime   CETIRIZINE (ZYRTEC) 10 MG TABLET    Take 1 tablet (10 mg total) by mouth daily.   HYDROCHLOROTHIAZIDE (MICROZIDE) 12.5 MG CAPSULE    Take 12.5 mg by mouth daily.   LEVOTHYROXINE (SYNTHROID, LEVOTHROID) 175 MCG TABLET    Take 1 tablet (175 mcg total) by mouth daily.   NIFEDIPINE (PROCARDIA XL/ADALAT-CC) 60 MG 24 HR TABLET    TAKE 1 TABLET (60 MG TOTAL) BY MOUTH DAILY.  Modified Medications   No medications on file  Discontinued Medications   No medications on file    Subjective: [name] is in for her routine visit. She has not missed a single dose of Atripla since her last visit.  Objective: Temp: 98.2 F (36.8 C) (08/06 1107) Temp src: Oral (08/06 1107) BP: 161/88 mmHg (08/06 1107) Pulse Rate: 85  (08/06 1107)  General: She is in good spirits as usual Skin: No active boils Lungs: Clear Cor: Regular S1 and S2 no murmurs  Lab Results HIV 1 RNA Quant (copies/mL)  Date  Value  02/09/2012 <20   07/25/2011 <20   02/14/2011 <20      CD4 T Cell Abs (cmm)  Date Value  02/09/2012 1040   07/25/2011 790   02/14/2011 1110      Assessment: Her HIV infection remains under excellent control.  Plan: 1. Continue Atripla 2. Followup after lab work in [place] months   Michel Bickers, MD [facility] [place] [phone] pager   [phone] cell 05/22/2012, 11:34 AM

## 2012-05-24 ENCOUNTER — Other Ambulatory Visit: Payer: Medicaid Other | Admitting: Physician Assistant

## 2012-05-24 ENCOUNTER — Telehealth: Payer: Self-pay | Admitting: Licensed Clinical Social Worker

## 2012-05-24 ENCOUNTER — Ambulatory Visit
Admission: RE | Admit: 2012-05-24 | Discharge: 2012-05-24 | Disposition: A | Discharge status: Home / Self Care | Payer: Medicaid Other | Source: Ambulatory Visit | Attending: Internal Medicine | Admitting: Internal Medicine

## 2012-05-24 ENCOUNTER — Other Ambulatory Visit: Payer: Self-pay | Admitting: Licensed Clinical Social Worker

## 2012-05-24 DIAGNOSIS — R7611 Nonspecific reaction to tuberculin skin test without active tuberculosis: Secondary | ICD-10-CM

## 2012-05-24 LAB — TB SKIN TEST: TB Skin Test: POSITIVE

## 2012-05-24 NOTE — Telephone Encounter (Signed)
Patient walked in for a ppd reading and it was positive 53mm, patient denies any active symtoms. She states it was itching really bad, and I noticed it was red. I spoke with Dr. [name] and asked me to order the patient a chest xray. I walked her over to [place], I told the patient I would call her with the results today or tomorrow.

## 2012-05-25 ENCOUNTER — Ambulatory Visit (INDEPENDENT_AMBULATORY_CARE_PROVIDER_SITE_OTHER): Payer: Medicaid Other | Admitting: Obstetrics & Gynecology

## 2012-05-25 ENCOUNTER — Encounter: Payer: Self-pay | Admitting: Licensed Clinical Social Worker

## 2012-05-25 ENCOUNTER — Encounter: Payer: Self-pay | Admitting: Obstetrics & Gynecology

## 2012-05-25 VITALS — BP 162/98 | HR 80 | Temp 97.3°F | Ht 65.0 in | Wt 250.7 lb

## 2012-05-25 DIAGNOSIS — N920 Excessive and frequent menstruation with regular cycle: Secondary | ICD-10-CM

## 2012-05-25 NOTE — Patient Instructions (Signed)
Menorrhagia  Dysfunctional uterine bleeding is different from a normal menstrual period. When periods are heavy or there is more bleeding than is usual for you, it is called menorrhagia. It may be caused by hormonal imbalance, or physical, metabolic, or other problems. Examination is necessary in order that your caregiver may treat treatable causes. If this is a continuing problem, a D&C may be needed. That means that the cervix (the opening of the uterus or womb) is dilated (stretched larger) and the lining of the uterus is scraped out. The tissue scraped out is then examined under a microscope by a specialist (pathologist) to make sure there is nothing of concern that needs further or more extensive treatment.  HOME CARE INSTRUCTIONS    If medications were prescribed, take exactly as directed. Do not change or switch medications without consulting your caregiver.   Long term heavy bleeding may result in iron deficiency. Your caregiver may have prescribed iron pills. They help replace the iron your body lost from heavy bleeding. Take exactly as directed. Iron may cause constipation. If this becomes a problem, increase the bran, fruits, and roughage in your diet.   Do not take aspirin or medicines that contain aspirin one week before or during your menstrual period. Aspirin may make the bleeding worse.   If you need to change your sanitary pad or tampon more than once every 2 hours, stay in bed and rest as much as possible until the bleeding stops.   Eat well-balanced meals. Eat foods high in iron. Examples are leafy green vegetables, meat, liver, eggs, and whole grain breads and cereals. Do not try to lose weight until the abnormal bleeding has stopped and your blood iron level is back to normal.  SEEK MEDICAL CARE IF:    You need to change your sanitary pad or tampon more than once an hour.   You develop nausea (feeling sick to your stomach) and vomiting, dizziness, or diarrhea while you are taking your  medicine.   You have any problems that may be related to the medicine you are taking.  SEEK IMMEDIATE MEDICAL CARE IF:    You have a fever.   You develop chills.   You develop severe bleeding or start to pass blood clots.   You feel dizzy or faint.  MAKE SURE YOU:    Understand these instructions.   Will watch your condition.   Will get help right away if you are not doing well or get worse.  Document Released: 10/03/2005 Document Revised: 09/22/2011 Document Reviewed: 05/23/2008  [employer] Patient Information 2012 [employer].

## 2012-05-25 NOTE — Progress Notes (Signed)
Subjective:     Patient ID: [name], female   DOB: Feb 17, 1966, 46 y.o.   MRN: [id number]  HPI Pt seen and evaluated for menorrhagia  In May.  At the time her hypothyroidism was out of control.  Her TSH was 14.  Her thyroid ds is now under better control and her cycles have improved.  Pt does c/o heavy cycles every other month associated with pain.  She c/o vasomotor sx and feel like she is going thought menopause.  Her mother underwent menopause in her early 30's.  Pt denies intermenstral bleeding or bleeding after intercourse.  + tobacco history.       Review of Systems No weight loss    Objective:   Physical Exam deferred  endometrial stripe 20mm in May 2013   Assessment:     Menorrhagia improved since last visit.  Pt with abnl TSH.  Her meds have been adjusted by her primary care doc.    Plan:     Pt to call to consider Novasure endometrial ablation if sx return or  worsen.  Pt will call if she decides.  Pt given handouts on ablation and and Mirena   Andrika Peraza L. Harraway-Smith, M.D., [place] June

## 2012-07-10 ENCOUNTER — Ambulatory Visit: Payer: Medicaid Other | Admitting: Endocrinology

## 2012-07-11 ENCOUNTER — Other Ambulatory Visit (INDEPENDENT_AMBULATORY_CARE_PROVIDER_SITE_OTHER): Payer: Medicaid Other

## 2012-07-11 ENCOUNTER — Ambulatory Visit (INDEPENDENT_AMBULATORY_CARE_PROVIDER_SITE_OTHER): Payer: Medicaid Other | Admitting: Endocrinology

## 2012-07-11 VITALS — BP 130/74 | HR 76 | Temp 97.4°F | Wt 253.0 lb

## 2012-07-11 DIAGNOSIS — E890 Postprocedural hypothyroidism: Secondary | ICD-10-CM

## 2012-07-11 MED ORDER — LEVOTHYROXINE SODIUM 200 MCG PO TABS: 200.0000 ug | ORAL_TABLET | Freq: Every day | ORAL | Status: DC

## 2012-07-11 NOTE — Patient Instructions (Addendum)
blood tests are being requested for you today.  You will receive a letter with results.  Please make a follow-up appointment in 3 months.  please call [phone] ([facility] physician referral line), to get an appointment with a new primary doctor.

## 2012-07-11 NOTE — Progress Notes (Signed)
Subjective:    Patient ID: [name], female    DOB: 1966/08/27, 46 y.o.   MRN: [id number]  HPI Pt was seen at ent in 2007 for ear infection.  She was incidentally noted to have a thyroid nodule.  bx in 2008 was suspicious, so she had a thyroidect in 2009.  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  She says she has  missed the synthroid twice in the past week.  pt states she has fatigue.  She says this has not improved since synthroid was increased. Past Medical History  Diagnosis Date  . Hyperthyroidism 08/2006    Grave's Disease -diffuse radiotracer uptake 08/25/06 Thyroid scan-Cold nodule to R lower lobe of thyrorid  . HIV DISEASE 07/24/2006    dx March 05  . Sarcoidosis 02/08/2007    dx as a teenager in [place] from abnl CXR. Completed 2 yrs Prednisone after lung bx confirmation. No symptoms since then.  . THYROID NODULE, RIGHT 02/08/2007  . GRAVE'S DISEASE 01/01/2008  . DEPRESSION 07/24/2006  . 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 HYPERTENSION 07/24/2006  . ASTHMA 07/24/2006  . GERD 07/24/2006  . 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 Anemia     Normocytic  . History of hidradenitis suppurativa   . Gastroenteritis 07/10/07  . Menometrorrhagia     hx of  . History of thrush     Past Surgical History  Procedure Date  . Breast surgery 1997    Breast Reduction   . Total thyroidectomy 2010  . [place] 1994  . Dilation and curettage of uterus Feb 2004    s/p for 1st trimester nonviable pregnancy  . Incision and drainage peritonsillar abscess Mar 03  . Incise and drain abcess Nov 03    s/p I &D for righ inframmary fold hidradenitis  . Irrigation and debridement abscess 01/31/2012    Procedure: IRRIGATION AND DEBRIDEMENT ABSCESS;  Surgeon: Shann Medal, MD;  Location: [facility];  Service: General;  Laterality: Right;  right breast and axilla     History   Social History  . Marital Status: Single    Spouse Name: N/A    Number of Children: 1  . Years of Education: N/A    Occupational History  . [employer]   Social History Main Topics  . Smoking status: Current Every Day Smoker -- 0.3 packs/day for 15 years    Types: Cigarettes  . Smokeless tobacco: Never Used  . Alcohol Use: 0.5 oz/week    1 drink(s) per week  . Drug Use: No  . Sexually Active: Not Currently     Given condoms   Other Topics Concern  . Not on file   Social History Narrative  . No narrative on file    Current Outpatient Prescriptions on File Prior to Visit  Medication Sig Dispense Refill  . acetaminophen (TYLENOL) 500 MG tablet Take 500 mg by mouth every 6 (six) hours as needed. For pain relief      . albuterol (PROVENTIL HFA;VENTOLIN HFA) 108 (90 BASE) MCG/ACT inhaler Inhale 2 puffs into the lungs 4 (four) times daily as needed. For shortness of breath      . ARTIFICIAL TEARS ophthalmic solution Place 2 drops into both eyes every 4 (four) hours as needed. For dry eyes      . ATRIPLA 600-200-300 MG per tablet take 1 tablet by mouth at bedtime  30 tablet  10  . cetirizine (ZYRTEC) 10 MG tablet Take 1 tablet (10 mg total) by mouth daily.  30 tablet  6  . hydrochlorothiazide (MICROZIDE) 12.5 MG capsule Take 12.5 mg by mouth daily.      Marland Kitchen NIFEdipine (PROCARDIA XL/ADALAT-CC) 60 MG 24 hr tablet TAKE 1 TABLET (60 MG TOTAL) BY MOUTH DAILY.  30 tablet  9    Allergies  Allergen Reactions  . Tape Rash    Rash   . Lisinopril Cough    Family History  Problem Relation Age of Onset  . Cancer Other     FH of Breast Cancer  . Cancer Other     Fh of Lung Cancer  . Heart disease Other   . Hypertension Other   . Stroke Other     Grandparent  . Kidney disease Other     Grandparent  . Diabetes Other     FH of Diabetes  . Hypertension Mother   . Cancer Mother     throat  . Hypertension Father   . Hypertension Sister   . Cancer Maternal Uncle     Stomach Cancer    BP 130/74  Pulse 76  Temp 97.4 F (36.3 C) (Oral)  Wt 253 lb (114.76 kg)  SpO2  98%  Review of Systems She also has weight gain    Objective:   Physical Exam VITAL SIGNS:  See vs page GENERAL: no distress Ext: trace bilat leg edema.   Lab Results  Component Value Date   TSH 12.18* 07/11/2012      Assessment & Plan:  Postsurgical hypothyroidism, therapy limited by noncompliance.  i'll do the best i can.

## 2012-08-26 ENCOUNTER — Encounter (HOSPITAL_COMMUNITY): Payer: Self-pay | Admitting: Emergency Medicine

## 2012-08-26 ENCOUNTER — Emergency Department (HOSPITAL_COMMUNITY): Payer: Medicaid Other

## 2012-08-26 ENCOUNTER — Emergency Department (HOSPITAL_COMMUNITY)
Admission: EM | Admit: 2012-08-26 | Discharge: 2012-08-26 | Disposition: A | Discharge status: Home / Self Care | Payer: Medicaid Other | Attending: Emergency Medicine | Admitting: Emergency Medicine

## 2012-08-26 DIAGNOSIS — L732 Hidradenitis suppurativa: Secondary | ICD-10-CM | POA: Insufficient documentation

## 2012-08-26 DIAGNOSIS — R319 Hematuria, unspecified: Secondary | ICD-10-CM | POA: Insufficient documentation

## 2012-08-26 DIAGNOSIS — E0590 Thyrotoxicosis, unspecified without thyrotoxic crisis or storm: Secondary | ICD-10-CM | POA: Insufficient documentation

## 2012-08-26 DIAGNOSIS — Z79899 Other long term (current) drug therapy: Secondary | ICD-10-CM | POA: Insufficient documentation

## 2012-08-26 DIAGNOSIS — Z862 Personal history of diseases of the blood and blood-forming organs and certain disorders involving the immune mechanism: Secondary | ICD-10-CM | POA: Insufficient documentation

## 2012-08-26 DIAGNOSIS — F329 Major depressive disorder, single episode, unspecified: Secondary | ICD-10-CM | POA: Insufficient documentation

## 2012-08-26 DIAGNOSIS — Z8709 Personal history of other diseases of the respiratory system: Secondary | ICD-10-CM | POA: Insufficient documentation

## 2012-08-26 DIAGNOSIS — J45909 Unspecified asthma, uncomplicated: Secondary | ICD-10-CM | POA: Insufficient documentation

## 2012-08-26 DIAGNOSIS — F3289 Other specified depressive episodes: Secondary | ICD-10-CM | POA: Insufficient documentation

## 2012-08-26 DIAGNOSIS — E0500 Thyrotoxicosis with diffuse goiter without thyrotoxic crisis or storm: Secondary | ICD-10-CM | POA: Insufficient documentation

## 2012-08-26 DIAGNOSIS — B20 Human immunodeficiency virus [HIV] disease: Secondary | ICD-10-CM | POA: Insufficient documentation

## 2012-08-26 DIAGNOSIS — F17200 Nicotine dependence, unspecified, uncomplicated: Secondary | ICD-10-CM | POA: Insufficient documentation

## 2012-08-26 DIAGNOSIS — Z8719 Personal history of other diseases of the digestive system: Secondary | ICD-10-CM | POA: Insufficient documentation

## 2012-08-26 DIAGNOSIS — N200 Calculus of kidney: Secondary | ICD-10-CM | POA: Insufficient documentation

## 2012-08-26 DIAGNOSIS — I10 Essential (primary) hypertension: Secondary | ICD-10-CM | POA: Insufficient documentation

## 2012-08-26 DIAGNOSIS — Z9089 Acquired absence of other organs: Secondary | ICD-10-CM | POA: Insufficient documentation

## 2012-08-26 DIAGNOSIS — E039 Hypothyroidism, unspecified: Secondary | ICD-10-CM | POA: Insufficient documentation

## 2012-08-26 LAB — URINALYSIS, ROUTINE W REFLEX MICROSCOPIC
Glucose, UA: NEGATIVE mg/dL
Nitrite: NEGATIVE
Specific Gravity, Urine: 1.017 (ref 1.005–1.030)
pH: 7 (ref 5.0–8.0)

## 2012-08-26 LAB — URINE MICROSCOPIC-ADD ON

## 2012-08-26 LAB — COMPREHENSIVE METABOLIC PANEL
ALT: 87 U/L — ABNORMAL HIGH (ref 0–35)
AST: 90 U/L — ABNORMAL HIGH (ref 0–37)
Albumin: 3.4 g/dL — ABNORMAL LOW (ref 3.5–5.2)
Alkaline Phosphatase: 147 U/L — ABNORMAL HIGH (ref 39–117)
CO2: 25 mEq/L (ref 19–32)
Chloride: 102 mEq/L (ref 96–112)
Creatinine, Ser: 0.79 mg/dL (ref 0.50–1.10)
GFR calc non Af Amer: >90 mL/min (ref >90)
Potassium: 3.3 mEq/L — ABNORMAL LOW (ref 3.5–5.1)
Total Bilirubin: 0.2 mg/dL — ABNORMAL LOW (ref 0.3–1.2)

## 2012-08-26 LAB — CBC WITH DIFFERENTIAL/PLATELET
Basophils Absolute: 0 10*3/uL (ref 0.0–0.1)
HCT: 37.7 % (ref 36.0–46.0)
Hemoglobin: 12.3 g/dL (ref 12.0–15.0)
Lymphocytes Relative: 47 % — ABNORMAL HIGH (ref 12–46)
Monocytes Absolute: 0.6 10*3/uL (ref 0.1–1.0)
Monocytes Relative: 8 % (ref 3–12)
Neutro Abs: 3.1 10*3/uL (ref 1.7–7.7)
Neutrophils Relative %: 42 % — ABNORMAL LOW (ref 43–77)
RDW: 16.2 % — ABNORMAL HIGH (ref 11.5–15.5)
WBC: 7.3 10*3/uL (ref 4.0–10.5)

## 2012-08-26 LAB — PREGNANCY, URINE: Preg Test, Ur: NEGATIVE

## 2012-08-26 MED ORDER — ONDANSETRON HCL 4 MG/2ML IJ SOLN
4.0000 mg | Freq: Once | INTRAMUSCULAR | Status: AC
  Administered 2012-08-26: 4 mg via INTRAVENOUS
  Filled 2012-08-26: qty 2

## 2012-08-26 MED ORDER — MORPHINE SULFATE 4 MG/ML IJ SOLN
4.0000 mg | Freq: Once | INTRAMUSCULAR | Status: AC
  Administered 2012-08-26: 4 mg via INTRAVENOUS
  Filled 2012-08-26: qty 1

## 2012-08-26 MED ORDER — SULFAMETHOXAZOLE-TRIMETHOPRIM 800-160 MG PO TABS: 1.0000 | ORAL_TABLET | Freq: Two times a day (BID) | ORAL | Status: DC

## 2012-08-26 MED ORDER — ONDANSETRON 4 MG PO TBDP: 4.0000 mg | ORAL_TABLET | Freq: Three times a day (TID) | ORAL | Status: DC | PRN

## 2012-08-26 MED ORDER — OXYCODONE-ACETAMINOPHEN 5-325 MG PO TABS: 2.0000 | ORAL_TABLET | ORAL | Status: DC | PRN

## 2012-08-26 MED ORDER — SODIUM CHLORIDE 0.9 % IV SOLN
Freq: Once | INTRAVENOUS | Status: AC
  Administered 2012-08-26: 20 mL/h via INTRAVENOUS

## 2012-08-26 NOTE — ED Provider Notes (Signed)
History     CSN: [id number]  Arrival date & time 08/26/12  1148   First MD Initiated Contact with Patient 08/26/12 1311      Chief Complaint  Patient presents with  . Flank Pain  . Hematuria  . Abdominal Pain    (Consider location/radiation/quality/duration/timing/severity/associated sxs/prior treatment) HPI Comments: Patient presents with a four-day history of pain in her right flank. She describes as a dull aching in her right upper back radiating around to her right midabdomen. During this time she is also noted some gross hematuria. She has a little bit of burning on urination. She denies any fevers or chills. She denies any nausea vomiting or diarrhea. She states the pain is not related to eating. She's not been taking anything at home for the pain. Denies a history of kidney stones or gross hematuria in the past.  Patient is a 46 y.o. female presenting with flank pain, hematuria, and abdominal pain.  Flank Pain Associated symptoms include abdominal pain. Pertinent negatives include no chest pain, no headaches and no shortness of breath.  Hematuria Irritative symptoms do not include frequency. Associated symptoms include abdominal pain, dysuria and flank pain. Pertinent negatives include no chills, fever, nausea or vomiting.  Abdominal Pain The primary symptoms of the illness include abdominal pain and dysuria. The primary symptoms of the illness do not include fever, fatigue, shortness of breath, nausea, vomiting or diarrhea.  The dysuria is associated with hematuria. The dysuria is not associated with frequency.  Additional symptoms associated with the illness include hematuria. Symptoms associated with the illness do not include chills, diaphoresis, frequency or back pain.    Past Medical History  Diagnosis Date  . Hyperthyroidism 08/2006    Grave's Disease -diffuse radiotracer uptake 08/25/06 Thyroid scan-Cold nodule to R lower lobe of thyrorid  . HIV DISEASE 07/24/2006   dx March 05  . Sarcoidosis 02/08/2007    dx as a teenager in [place] from abnl CXR. Completed 2 yrs Prednisone after lung bx confirmation. No symptoms since then.  . THYROID NODULE, RIGHT 02/08/2007  . GRAVE'S DISEASE 01/01/2008  . DEPRESSION 07/24/2006  . 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 HYPERTENSION 07/24/2006  . ASTHMA 07/24/2006  . GERD 07/24/2006  . 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 Anemia     Normocytic  . History of hidradenitis suppurativa   . Gastroenteritis 07/10/07  . Menometrorrhagia     hx of  . History of thrush     Past Surgical History  Procedure Date  . Breast surgery 1997    Breast Reduction   . Total thyroidectomy 2010  . [place] 1994  . Dilation and curettage of uterus Feb 2004    s/p for 1st trimester nonviable pregnancy  . Incision and drainage peritonsillar abscess Mar 03  . Incise and drain abcess Nov 03    s/p I &D for righ inframmary fold hidradenitis  . Irrigation and debridement abscess 01/31/2012    Procedure: IRRIGATION AND DEBRIDEMENT ABSCESS;  Surgeon: Shann Medal, MD;  Location: [facility];  Service: General;  Laterality: Right;  right breast and axilla     Family History  Problem Relation Age of Onset  . Cancer Other     FH of Breast Cancer  . Cancer Other     Fh of Lung Cancer  . Heart disease Other   . Hypertension Other   . Stroke Other     Grandparent  . Kidney disease Other     Grandparent  . Diabetes Other  FH of Diabetes  . Hypertension Mother   . Cancer Mother     throat  . Hypertension Father   . Hypertension Sister   . Cancer Maternal Uncle     Stomach Cancer    History  Substance Use Topics  . Smoking status: Current Every Day Smoker -- 0.3 packs/day for 15 years    Types: Cigarettes  . Smokeless tobacco: Never Used  . Alcohol Use: No    OB History    Grav Para Term Preterm Abortions TAB SAB Ect Mult Living                  Review of Systems  Constitutional: Negative for fever, chills,  diaphoresis and fatigue.  HENT: Negative for congestion, rhinorrhea and sneezing.   Eyes: Negative.   Respiratory: Negative for cough, chest tightness and shortness of breath.   Cardiovascular: Negative for chest pain and leg swelling.  Gastrointestinal: Positive for abdominal pain. Negative for nausea, vomiting, diarrhea and blood in stool.  Genitourinary: Positive for dysuria, hematuria and flank pain. Negative for frequency and difficulty urinating.  Musculoskeletal: Negative for back pain and arthralgias.  Skin: Negative for rash.  Neurological: Negative for dizziness, speech difficulty, weakness, numbness and headaches.    Allergies  Tape; Lisinopril; and Xylocaine  Home Medications   Current Outpatient Rx  Name  Route  Sig  Dispense  Refill  . ALBUTEROL SULFATE HFA 108 (90 BASE) MCG/ACT IN AERS   Inhalation   Inhale 2 puffs into the lungs 4 (four) times daily as needed. For shortness of breath         . ARTIFICIAL TEARS OP SOLN   Both Eyes   Place 2 drops into both eyes every 4 (four) hours as needed. For dry eyes         . ATRIPLA 600-200-300 MG PO TABS      take 1 tablet by mouth at bedtime   30 tablet   10   . HYDROCHLOROTHIAZIDE 12.5 MG PO CAPS   Oral   Take 12.5 mg by mouth daily.         Marland Kitchen LEVOTHYROXINE SODIUM 200 MCG PO TABS   Oral   Take 200 mcg by mouth daily.         Marland Kitchen NIFEDIPINE ER OSMOTIC 60 MG PO TB24   Oral   Take 60 mg by mouth daily.         . SULFAMETHOXAZOLE-TRIMETHOPRIM 800-160 MG PO TABS   Oral   Take 1 tablet by mouth every 12 (twelve) hours.   28 tablet   0     BP 158/93  Pulse 79  Temp 98.7 F (37.1 C) (Oral)  Resp 16  Wt 250 lb (113.399 kg)  SpO2 100%  LMP 08/20/2012  Physical Exam  Constitutional: She is oriented to person, place, and time. She appears well-developed and well-nourished.  HENT:  Head: Normocephalic and atraumatic.  Eyes: Pupils are equal, round, and reactive to light.  Neck: Normal range of  motion. Neck supple.  Cardiovascular: Normal rate, regular rhythm and normal heart sounds.   Pulmonary/Chest: Effort normal and breath sounds normal. No respiratory distress. She has no wheezes. She has no rales. She exhibits no tenderness.  Abdominal: Soft. Bowel sounds are normal. There is tenderness (moderate TTP right mid abdomen.  mild right CVA tenderness). There is no rebound and no guarding.  Musculoskeletal: Normal range of motion. She exhibits no edema.  Lymphadenopathy:    She has no cervical  adenopathy.  Neurological: She is alert and oriented to person, place, and time.  Skin: Skin is warm and dry. No rash noted.  Psychiatric: She has a normal mood and affect.    ED Course  Procedures (including critical care time)  Results for orders placed during the hospital encounter of 08/26/12  URINALYSIS, ROUTINE W REFLEX MICROSCOPIC      Component Value Range   Color, Urine AMBER (*) YELLOW   APPearance CLOUDY (*) CLEAR   Specific Gravity, Urine 1.017  1.005 - 1.030   pH 7.0  5.0 - 8.0   Glucose, UA NEGATIVE  NEGATIVE mg/dL   Hgb urine dipstick LARGE (*) NEGATIVE   Bilirubin Urine NEGATIVE  NEGATIVE   Ketones, ur NEGATIVE  NEGATIVE mg/dL   Protein, ur 100 (*) NEGATIVE mg/dL   Urobilinogen, UA 0.2  0.0 - 1.0 mg/dL   Nitrite NEGATIVE  NEGATIVE   Leukocytes, UA LARGE (*) NEGATIVE  URINE MICROSCOPIC-ADD ON      Component Value Range   WBC, UA 21-50  <3 WBC/hpf   RBC / HPF TOO NUMEROUS TO COUNT  <3 RBC/hpf   Bacteria, UA MANY (*) RARE  PREGNANCY, URINE      Component Value Range   Preg Test, Ur NEGATIVE  NEGATIVE  CBC WITH DIFFERENTIAL      Component Value Range   WBC 7.3  4.0 - 10.5 K/uL   RBC 4.74  3.87 - 5.11 MIL/uL   Hemoglobin 12.3  12.0 - 15.0 g/dL   HCT 37.7  36.0 - 46.0 %   MCV 79.5  78.0 - 100.0 fL   MCH 25.9 (*) 26.0 - 34.0 pg   MCHC 32.6  30.0 - 36.0 g/dL   RDW 16.2 (*) 11.5 - 15.5 %   Platelets 427 (*) 150 - 400 K/uL   Neutrophils Relative 42 (*) 43 - 77 %     Neutro Abs 3.1  1.7 - 7.7 K/uL   Lymphocytes Relative 47 (*) 12 - 46 %   Lymphs Abs 3.5  0.7 - 4.0 K/uL   Monocytes Relative 8  3 - 12 %   Monocytes Absolute 0.6  0.1 - 1.0 K/uL   Eosinophils Relative 2  0 - 5 %   Eosinophils Absolute 0.2  0.0 - 0.7 K/uL   Basophils Relative 0  0 - 1 %   Basophils Absolute 0.0  0.0 - 0.1 K/uL  COMPREHENSIVE METABOLIC PANEL      Component Value Range   Sodium 139  135 - 145 mEq/L   Potassium 3.3 (*) 3.5 - 5.1 mEq/L   Chloride 102  96 - 112 mEq/L   CO2 25  19 - 32 mEq/L   Glucose, Bld 142 (*) 70 - 99 mg/dL   BUN 12  6 - 23 mg/dL   Creatinine, Ser 0.79  0.50 - 1.10 mg/dL   Calcium 9.1  8.4 - 10.5 mg/dL   Total Protein 8.0  6.0 - 8.3 g/dL   Albumin 3.4 (*) 3.5 - 5.2 g/dL   AST 90 (*) 0 - 37 U/L   ALT 87 (*) 0 - 35 U/L   Alkaline Phosphatase 147 (*) 39 - 117 U/L   Total Bilirubin 0.2 (*) 0.3 - 1.2 mg/dL   GFR calc non Af Amer >90  >90 mL/min   GFR calc Af Amer >90  >90 mL/min   Ct Abdomen Pelvis Wo Contrast  08/26/2012  *RADIOLOGY REPORT*  Clinical Data: Right-sided flank pain and hematuria.  CT  ABDOMEN AND PELVIS WITHOUT CONTRAST  Technique:  Multidetector CT imaging of the abdomen and pelvis was performed following the standard protocol without intravenous contrast.  Comparison: None.  Findings: Lung bases are clear.  No pleural or pericardial fluid.  There is mild fatty change of the liver and mild hepatomegaly.  No calcified gallstones.  The spleen is normal.  The pancreas is normal.  The adrenal glands are normal.  The aorta and IVC are normal except for some premature atherosclerotic change.  The left kidney is normal except for a 4 x 7 mm stone in the lower pole calyceal system and a 1 mm stone in the upper pole.  No mass, cyst or obstruction on the left.  On the right, there is extensive staghorn calculus formation throughout the renal collecting system, extending out into the renal pelvis.  No sign of hydronephrosis.  No sign of abscess or  inflammation.  No sign of passing stone.  No stone in the bladder.  Uterus and adnexal regions appear unremarkable.  No significant bony finding.  IMPRESSION: Staghorn calculus formation throughout the right kidney.  No evidence of hydronephrosis or gross inflammation.  No evidence of passing stone.  4 x 7 mm stone in the lower pole of the left kidney.  1 mm stone in the upper pole of the left kidney.   Original Report Authenticated By: Nelson Chimes, M.D.       1. Staghorn renal calculus       MDM  Pt with right staghorn calculus.  Non-toxic appearing.  No fevers.  Pain controlled.  Discussed with Dr Gaynelle Arabian who feels that pt can be discharged, start on bactrim, pain meds.  Urine culture sent.  Advised to return if symptoms worsen, has fever, vomiting        Malvin Johns, MD 08/26/12 1453

## 2012-08-26 NOTE — ED Notes (Signed)
Patient reports that on Thurday she started to have lower back pain to the right with blood in her urine. She also has pain to her abdominal area. The patient reports pain with urination. Reports that her urine is red

## 2012-08-27 ENCOUNTER — Telehealth: Payer: Self-pay | Admitting: *Deleted

## 2012-08-27 NOTE — Telephone Encounter (Signed)
Patient called she was seen at the ED this weekend for kidney stones. Needed referral to urology as [facility] is listed on her Medicaid card.  Referral made and [facility] will call patient to set up appt, patient notified. Myrtis Hopping CMA

## 2012-08-28 ENCOUNTER — Other Ambulatory Visit (HOSPITAL_COMMUNITY): Payer: Self-pay | Admitting: Urology

## 2012-08-28 DIAGNOSIS — N200 Calculus of kidney: Secondary | ICD-10-CM

## 2012-08-30 ENCOUNTER — Telehealth: Payer: Self-pay | Admitting: *Deleted

## 2012-08-30 NOTE — Telephone Encounter (Signed)
Spoke with pt and scheduled appt for PAP smear.

## 2012-09-01 LAB — URINE CULTURE

## 2012-09-07 ENCOUNTER — Encounter (HOSPITAL_COMMUNITY)
Admission: RE | Admit: 2012-09-07 | Discharge: 2012-09-07 | Disposition: A | Discharge status: Home / Self Care | Payer: Medicaid Other | Source: Ambulatory Visit | Attending: Urology | Admitting: Urology

## 2012-09-07 DIAGNOSIS — R109 Unspecified abdominal pain: Secondary | ICD-10-CM | POA: Insufficient documentation

## 2012-09-07 DIAGNOSIS — N289 Disorder of kidney and ureter, unspecified: Secondary | ICD-10-CM | POA: Insufficient documentation

## 2012-09-07 DIAGNOSIS — N200 Calculus of kidney: Secondary | ICD-10-CM | POA: Insufficient documentation

## 2012-09-07 MED ORDER — FUROSEMIDE 10 MG/ML IJ SOLN
60.0000 mg | Freq: Once | INTRAMUSCULAR | Status: DC
  Filled 2012-09-07: qty 2

## 2012-09-07 MED ORDER — TECHNETIUM TC 99M MERTIATIDE
15.5000 | Freq: Once | INTRAVENOUS | Status: AC | PRN
  Administered 2012-09-07: 16 via INTRAVENOUS

## 2012-09-17 ENCOUNTER — Other Ambulatory Visit: Payer: Self-pay | Admitting: Urology

## 2012-09-19 ENCOUNTER — Other Ambulatory Visit: Payer: Self-pay | Admitting: Internal Medicine

## 2012-09-21 ENCOUNTER — Ambulatory Visit: Payer: Medicaid Other

## 2012-09-25 ENCOUNTER — Encounter (HOSPITAL_COMMUNITY): Payer: Self-pay | Admitting: Pharmacy Technician

## 2012-09-25 NOTE — Patient Instructions (Addendum)
[place]  09/25/2012   Your procedure is scheduled on: 10/02/12  Report to [facility] at [place] AM.  Call this number if you have problems the morning of surgery [phone]: [phone]   Remember: please bring inhaler    Do not eat food or drink liquids After Midnight.     Take these medicines the morning of surgery with A SIP OF WATER: albuterol, synthroid, nifedipine   Do not wear jewelry, make-up or nail polish.  Do not wear lotions, powders, or perfumes. You may wear deodorant.  Do not shave 48 hours prior to surgery. Men may shave face and neck.  Do not bring valuables to the hospital.  Contacts, dentures or bridgework may not be worn into surgery.  Leave suitcase in the car. After surgery it may be brought to your room.  For patients admitted to the hospital, checkout time is 11:00 AM the day of discharge.    Special Instructions: Shower using CHG 2 nights before surgery and the night before surgery.  If you shower the day of surgery use CHG.  Use special wash - you have one bottle of CHG for all showers.  You should use approximately 1/3 of the bottle for each shower.   Please read over the following fact sheets that you were given: MRSA Information.  Paulette Blanch, RN  pre op nurse call if needed [phone]    FAILURE TO FOLLOW THESE INSTRUCTIONS MAY RESULT IN CANCELLATION OF YOUR SURGERY   Patient Signature: ___________________________________________

## 2012-09-25 NOTE — Progress Notes (Signed)
EKG 01/30/12 on EPIC, chest x-ray 05/24/12 on EPIC

## 2012-09-26 ENCOUNTER — Encounter (HOSPITAL_COMMUNITY): Payer: Self-pay

## 2012-09-26 ENCOUNTER — Encounter (HOSPITAL_COMMUNITY)
Admission: RE | Admit: 2012-09-26 | Discharge: 2012-09-26 | Disposition: A | Discharge status: Home / Self Care | Payer: Medicaid Other | Source: Ambulatory Visit | Attending: Urology | Admitting: Urology

## 2012-09-26 HISTORY — DX: Bronchitis, not specified as acute or chronic: J40

## 2012-09-26 HISTORY — DX: Pneumonia, unspecified organism: J18.9

## 2012-09-26 LAB — CBC
Hemoglobin: 11.3 g/dL — ABNORMAL LOW (ref 12.0–15.0)
MCH: 25.6 pg — ABNORMAL LOW (ref 26.0–34.0)
Platelets: 379 10*3/uL (ref 150–400)
RBC: 4.41 MIL/uL (ref 3.87–5.11)
WBC: 7 10*3/uL (ref 4.0–10.5)

## 2012-09-26 LAB — BASIC METABOLIC PANEL
CO2: 28 mEq/L (ref 19–32)
Chloride: 103 mEq/L (ref 96–112)
Glucose, Bld: 92 mg/dL (ref 70–99)
Potassium: 4.1 mEq/L (ref 3.5–5.1)
Sodium: 138 mEq/L (ref 135–145)

## 2012-09-26 LAB — SURGICAL PCR SCREEN
MRSA, PCR: NEGATIVE
Staphylococcus aureus: NEGATIVE

## 2012-09-26 LAB — HCG, SERUM, QUALITATIVE: Preg, Serum: NEGATIVE

## 2012-10-01 MED ORDER — PIPERACILLIN-TAZOBACTAM 3.375 G IVPB 30 MIN
3.3750 g | Freq: Once | INTRAVENOUS | Status: AC
  Administered 2012-10-02: 3.375 g via INTRAVENOUS
  Filled 2012-10-01 (×2): qty 50

## 2012-10-02 ENCOUNTER — Inpatient Hospital Stay (HOSPITAL_COMMUNITY)
Admission: RE | Admit: 2012-10-02 | Discharge: 2012-10-12 | DRG: 660 | Disposition: A | Discharge status: Home / Self Care | Payer: Medicaid Other | Source: Ambulatory Visit | Attending: Urology | Admitting: Urology

## 2012-10-02 ENCOUNTER — Ambulatory Visit (HOSPITAL_COMMUNITY): Payer: Medicaid Other | Admitting: *Deleted

## 2012-10-02 ENCOUNTER — Encounter (HOSPITAL_COMMUNITY): Payer: Self-pay | Admitting: *Deleted

## 2012-10-02 ENCOUNTER — Ambulatory Visit (HOSPITAL_COMMUNITY): Payer: Medicaid Other

## 2012-10-02 ENCOUNTER — Encounter (HOSPITAL_COMMUNITY)
Admission: RE | Disposition: A | Discharge status: Home / Self Care | Payer: Self-pay | Source: Ambulatory Visit | Attending: Urology

## 2012-10-02 DIAGNOSIS — J45909 Unspecified asthma, uncomplicated: Secondary | ICD-10-CM | POA: Diagnosis present

## 2012-10-02 DIAGNOSIS — K219 Gastro-esophageal reflux disease without esophagitis: Secondary | ICD-10-CM | POA: Diagnosis present

## 2012-10-02 DIAGNOSIS — N200 Calculus of kidney: Principal | ICD-10-CM | POA: Diagnosis present

## 2012-10-02 DIAGNOSIS — Z21 Asymptomatic human immunodeficiency virus [HIV] infection status: Secondary | ICD-10-CM | POA: Diagnosis present

## 2012-10-02 DIAGNOSIS — B20 Human immunodeficiency virus [HIV] disease: Secondary | ICD-10-CM

## 2012-10-02 DIAGNOSIS — E0500 Thyrotoxicosis with diffuse goiter without thyrotoxic crisis or storm: Secondary | ICD-10-CM | POA: Diagnosis present

## 2012-10-02 DIAGNOSIS — F17200 Nicotine dependence, unspecified, uncomplicated: Secondary | ICD-10-CM | POA: Diagnosis present

## 2012-10-02 DIAGNOSIS — D869 Sarcoidosis, unspecified: Secondary | ICD-10-CM | POA: Diagnosis present

## 2012-10-02 DIAGNOSIS — Z6841 Body Mass Index (BMI) 40.0 and over, adult: Secondary | ICD-10-CM

## 2012-10-02 DIAGNOSIS — E0590 Thyrotoxicosis, unspecified without thyrotoxic crisis or storm: Secondary | ICD-10-CM | POA: Diagnosis present

## 2012-10-02 DIAGNOSIS — Z79899 Other long term (current) drug therapy: Secondary | ICD-10-CM

## 2012-10-02 DIAGNOSIS — E669 Obesity, unspecified: Secondary | ICD-10-CM | POA: Diagnosis present

## 2012-10-02 DIAGNOSIS — I10 Essential (primary) hypertension: Secondary | ICD-10-CM | POA: Diagnosis present

## 2012-10-02 DIAGNOSIS — Q638 Other specified congenital malformations of kidney: Secondary | ICD-10-CM

## 2012-10-02 HISTORY — PX: CYSTOSCOPY WITH STENT PLACEMENT: SHX5790

## 2012-10-02 HISTORY — PX: NEPHROLITHOTOMY: SHX5134

## 2012-10-02 LAB — BASIC METABOLIC PANEL
CO2: 24 mEq/L (ref 19–32)
Calcium: 8.6 mg/dL (ref 8.4–10.5)
Creatinine, Ser: 0.93 mg/dL (ref 0.50–1.10)
GFR calc Af Amer: 85 mL/min — ABNORMAL LOW (ref >90)
GFR calc non Af Amer: 74 mL/min — ABNORMAL LOW (ref >90)
Sodium: 134 mEq/L — ABNORMAL LOW (ref 135–145)

## 2012-10-02 LAB — HEMOGLOBIN AND HEMATOCRIT, BLOOD: Hemoglobin: 10.3 g/dL — ABNORMAL LOW (ref 12.0–15.0)

## 2012-10-02 SURGERY — NEPHROLITHOTOMY PERCUTANEOUS
Anesthesia: General | Laterality: Right | Wound class: Clean Contaminated

## 2012-10-02 MED ORDER — IOHEXOL 300 MG/ML  SOLN
INTRAMUSCULAR | Status: DC | PRN
  Administered 2012-10-02: 78 mL

## 2012-10-02 MED ORDER — SUFENTANIL CITRATE 50 MCG/ML IV SOLN
INTRAVENOUS | Status: DC | PRN
  Administered 2012-10-02: 10 ug via INTRAVENOUS
  Administered 2012-10-02 (×2): 5 ug via INTRAVENOUS
  Administered 2012-10-02 (×2): 10 ug via INTRAVENOUS
  Administered 2012-10-02 (×2): 5 ug via INTRAVENOUS

## 2012-10-02 MED ORDER — ONDANSETRON HCL 4 MG/2ML IJ SOLN
4.0000 mg | INTRAMUSCULAR | Status: DC | PRN
  Administered 2012-10-02 – 2012-10-07 (×4): 4 mg via INTRAVENOUS
  Filled 2012-10-02 (×3): qty 2

## 2012-10-02 MED ORDER — SENNA 8.6 MG PO TABS
1.0000 | ORAL_TABLET | Freq: Two times a day (BID) | ORAL | Status: DC
  Administered 2012-10-02 – 2012-10-12 (×19): 8.6 mg via ORAL
  Filled 2012-10-02 (×20): qty 1

## 2012-10-02 MED ORDER — SODIUM CHLORIDE 0.9 % IR SOLN
Status: DC | PRN
  Administered 2012-10-02: 24000 mL

## 2012-10-02 MED ORDER — LIDOCAINE HCL (CARDIAC) 20 MG/ML IV SOLN
INTRAVENOUS | Status: DC | PRN
  Administered 2012-10-02: 50 mg via INTRAVENOUS

## 2012-10-02 MED ORDER — OXYCODONE HCL 5 MG PO TABS
5.0000 mg | ORAL_TABLET | ORAL | Status: DC | PRN
  Administered 2012-10-03 – 2012-10-10 (×12): 5 mg via ORAL
  Filled 2012-10-02 (×14): qty 1

## 2012-10-02 MED ORDER — SUCCINYLCHOLINE CHLORIDE 20 MG/ML IJ SOLN
INTRAMUSCULAR | Status: DC | PRN
  Administered 2012-10-02: 200 mg via INTRAVENOUS

## 2012-10-02 MED ORDER — DEXAMETHASONE SODIUM PHOSPHATE 10 MG/ML IJ SOLN
INTRAMUSCULAR | Status: DC | PRN
  Administered 2012-10-02: 5 mg via INTRAVENOUS

## 2012-10-02 MED ORDER — HYDROCHLOROTHIAZIDE 12.5 MG PO CAPS
12.5000 mg | ORAL_CAPSULE | Freq: Every morning | ORAL | Status: DC
  Administered 2012-10-02 – 2012-10-12 (×11): 12.5 mg via ORAL
  Filled 2012-10-02 (×12): qty 1

## 2012-10-02 MED ORDER — HYDROMORPHONE HCL PF 1 MG/ML IJ SOLN
0.5000 mg | INTRAMUSCULAR | Status: DC | PRN
  Administered 2012-10-02 – 2012-10-03 (×5): 1 mg via INTRAVENOUS
  Administered 2012-10-04: 0.5 mg via INTRAVENOUS
  Administered 2012-10-04 – 2012-10-05 (×3): 1 mg via INTRAVENOUS
  Administered 2012-10-05: 0.5 mg via INTRAVENOUS
  Administered 2012-10-06 – 2012-10-11 (×24): 1 mg via INTRAVENOUS
  Filled 2012-10-02 (×36): qty 1

## 2012-10-02 MED ORDER — HYDROMORPHONE HCL PF 1 MG/ML IJ SOLN
INTRAMUSCULAR | Status: DC | PRN
  Administered 2012-10-02: 1 mg via INTRAVENOUS

## 2012-10-02 MED ORDER — ACETAMINOPHEN 10 MG/ML IV SOLN
INTRAVENOUS | Status: DC | PRN
  Administered 2012-10-02: 1000 mg via INTRAVENOUS

## 2012-10-02 MED ORDER — METOCLOPRAMIDE HCL 5 MG/ML IJ SOLN
INTRAMUSCULAR | Status: DC | PRN
  Administered 2012-10-02: 10 mg via INTRAVENOUS

## 2012-10-02 MED ORDER — ONDANSETRON HCL 4 MG/2ML IJ SOLN
INTRAMUSCULAR | Status: DC | PRN
  Administered 2012-10-02: 4 mg via INTRAVENOUS

## 2012-10-02 MED ORDER — ACETAMINOPHEN 10 MG/ML IV SOLN
INTRAVENOUS | Status: AC
  Filled 2012-10-02: qty 100

## 2012-10-02 MED ORDER — DOCUSATE SODIUM 100 MG PO CAPS
100.0000 mg | ORAL_CAPSULE | Freq: Two times a day (BID) | ORAL | Status: DC
  Administered 2012-10-02 – 2012-10-11 (×19): 100 mg via ORAL
  Filled 2012-10-02 (×25): qty 1

## 2012-10-02 MED ORDER — ALBUTEROL SULFATE HFA 108 (90 BASE) MCG/ACT IN AERS
2.0000 | INHALATION_SPRAY | RESPIRATORY_TRACT | Status: DC | PRN
  Filled 2012-10-02: qty 6.7

## 2012-10-02 MED ORDER — POLYVINYL ALCOHOL 1.4 % OP SOLN
2.0000 [drp] | OPHTHALMIC | Status: DC | PRN
  Filled 2012-10-02: qty 15

## 2012-10-02 MED ORDER — OXYBUTYNIN CHLORIDE 5 MG PO TABS
5.0000 mg | ORAL_TABLET | Freq: Three times a day (TID) | ORAL | Status: DC | PRN
  Filled 2012-10-02: qty 1

## 2012-10-02 MED ORDER — KCL IN DEXTROSE-NACL 10-5-0.45 MEQ/L-%-% IV SOLN
INTRAVENOUS | Status: DC
  Administered 2012-10-02 – 2012-10-11 (×6): via INTRAVENOUS
  Filled 2012-10-02 (×14): qty 1000

## 2012-10-02 MED ORDER — PROMETHAZINE HCL 25 MG/ML IJ SOLN: 6.2500 mg | INTRAMUSCULAR | Status: DC | PRN

## 2012-10-02 MED ORDER — PROPOFOL 10 MG/ML IV BOLUS
INTRAVENOUS | Status: DC | PRN
  Administered 2012-10-02: 200 mg via INTRAVENOUS

## 2012-10-02 MED ORDER — IOHEXOL 300 MG/ML  SOLN
INTRAMUSCULAR | Status: AC
  Filled 2012-10-02: qty 2

## 2012-10-02 MED ORDER — HYDROMORPHONE HCL PF 1 MG/ML IJ SOLN
0.2500 mg | INTRAMUSCULAR | Status: DC | PRN
  Administered 2012-10-02 (×2): 0.5 mg via INTRAVENOUS

## 2012-10-02 MED ORDER — EFAVIRENZ-EMTRICITAB-TENOFOVIR 600-200-300 MG PO TABS
1.0000 | ORAL_TABLET | Freq: Every day | ORAL | Status: DC
  Administered 2012-10-02 – 2012-10-11 (×9): 1 via ORAL
  Filled 2012-10-02 (×13): qty 1

## 2012-10-02 MED ORDER — HYDROMORPHONE HCL PF 1 MG/ML IJ SOLN
INTRAMUSCULAR | Status: AC
  Filled 2012-10-02: qty 1

## 2012-10-02 MED ORDER — ACETAMINOPHEN 10 MG/ML IV SOLN
1000.0000 mg | Freq: Four times a day (QID) | INTRAVENOUS | Status: AC
  Administered 2012-10-02 – 2012-10-03 (×4): 1000 mg via INTRAVENOUS
  Filled 2012-10-02 (×4): qty 100

## 2012-10-02 MED ORDER — LEVOTHYROXINE SODIUM 200 MCG PO TABS
200.0000 ug | ORAL_TABLET | Freq: Every day | ORAL | Status: DC
  Administered 2012-10-03 – 2012-10-12 (×10): 200 ug via ORAL
  Filled 2012-10-02 (×12): qty 1

## 2012-10-02 MED ORDER — LACTATED RINGERS IV SOLN
INTRAVENOUS | Status: DC | PRN
  Administered 2012-10-02 (×4): via INTRAVENOUS

## 2012-10-02 MED ORDER — ALBUTEROL SULFATE HFA 108 (90 BASE) MCG/ACT IN AERS
INHALATION_SPRAY | RESPIRATORY_TRACT | Status: DC | PRN
  Administered 2012-10-02 (×2): 5 via RESPIRATORY_TRACT

## 2012-10-02 MED ORDER — NIFEDIPINE ER 60 MG PO TB24
60.0000 mg | ORAL_TABLET | Freq: Every day | ORAL | Status: DC
  Administered 2012-10-03 – 2012-10-12 (×10): 60 mg via ORAL
  Filled 2012-10-02 (×11): qty 1

## 2012-10-02 MED ORDER — NEOSTIGMINE METHYLSULFATE 1 MG/ML IJ SOLN
INTRAMUSCULAR | Status: DC | PRN
  Administered 2012-10-02: 5 mg via INTRAVENOUS

## 2012-10-02 MED ORDER — ARTIFICIAL TEARS OP SOLN
2.0000 [drp] | OPHTHALMIC | Status: DC | PRN
Start: 2012-10-02 — End: 2012-10-02

## 2012-10-02 MED ORDER — GLYCOPYRROLATE 0.2 MG/ML IJ SOLN
INTRAMUSCULAR | Status: DC | PRN
  Administered 2012-10-02: 0.6 mg via INTRAVENOUS
  Administered 2012-10-02: 0.2 mg via INTRAVENOUS

## 2012-10-02 MED ORDER — ROCURONIUM BROMIDE 100 MG/10ML IV SOLN
INTRAVENOUS | Status: DC | PRN
  Administered 2012-10-02: 15 mg via INTRAVENOUS
  Administered 2012-10-02: 5 mg via INTRAVENOUS
  Administered 2012-10-02: 20 mg via INTRAVENOUS
  Administered 2012-10-02: 15 mg via INTRAVENOUS
  Administered 2012-10-02: 30 mg via INTRAVENOUS

## 2012-10-02 MED ORDER — MIDAZOLAM HCL 5 MG/5ML IJ SOLN
INTRAMUSCULAR | Status: DC | PRN
  Administered 2012-10-02 (×2): 1 mg via INTRAVENOUS

## 2012-10-02 SURGICAL SUPPLY — 54 items
ADAPTER CATH URET PLST 4-6FR (CATHETERS) IMPLANT
BAG URINE DRAINAGE (UROLOGICAL SUPPLIES) ×3 IMPLANT
BAG URO CATCHER STRL LF (DRAPE) ×3 IMPLANT
BASKET ZERO TIP NITINOL 2.4FR (BASKET) IMPLANT
BENZOIN TINCTURE PRP APPL 2/3 (GAUZE/BANDAGES/DRESSINGS) ×6 IMPLANT
BLADE SURG 15 STRL LF DISP TIS (BLADE) ×2 IMPLANT
BLADE SURG 15 STRL SS (BLADE) ×1
CATH BEACON 5.038 65CM KMP-01 (CATHETERS) ×9 IMPLANT
CATH FOLEY 2W COUNCIL 20FR 5CC (CATHETERS) IMPLANT
CATH INTERMIT  6FR 70CM (CATHETERS) ×3 IMPLANT
CATH ROBINSON RED A/P 20FR (CATHETERS) IMPLANT
CATH SILICON 18FR 30CC (CATHETERS) ×3 IMPLANT
CATH X-FORCE N30 NEPHROSTOMY (TUBING) ×3 IMPLANT
CLOTH BEACON ORANGE TIMEOUT ST (SAFETY) ×3 IMPLANT
COVER SURGICAL LIGHT HANDLE (MISCELLANEOUS) ×3 IMPLANT
DRAPE C-ARM 42X72 X-RAY (DRAPES) ×3 IMPLANT
DRAPE CAMERA CLOSED 9X96 (DRAPES) ×6 IMPLANT
DRAPE LINGEMAN PERC (DRAPES) ×3 IMPLANT
DRAPE SURG IRRIG POUCH 19X23 (DRAPES) ×3 IMPLANT
DRSG TEGADERM 8X12 (GAUZE/BANDAGES/DRESSINGS) ×6 IMPLANT
GLOVE BIOGEL M 8.0 STRL (GLOVE) ×24 IMPLANT
GLOVE BIOGEL M STRL SZ7.5 (GLOVE) ×3 IMPLANT
GOWN STRL NON-REIN LRG LVL3 (GOWN DISPOSABLE) ×6 IMPLANT
GOWN STRL REIN XL XLG (GOWN DISPOSABLE) ×18 IMPLANT
GUIDEWIRE AMPLAZ .035X145 (WIRE) ×9 IMPLANT
GUIDEWIRE ANG ZIPWIRE 038X150 (WIRE) ×3 IMPLANT
GUIDEWIRE STR DUAL SENSOR (WIRE) ×6 IMPLANT
KIT BASIN OR (CUSTOM PROCEDURE TRAY) ×3 IMPLANT
LASER FIBER DISP (UROLOGICAL SUPPLIES) IMPLANT
LASER FIBER DISP 1000U (UROLOGICAL SUPPLIES) IMPLANT
MANIFOLD NEPTUNE II (INSTRUMENTS) ×3 IMPLANT
NEEDLE TROCAR 18X20 (NEEDLE) ×3 IMPLANT
NS IRRIG 1000ML POUR BTL (IV SOLUTION) ×6 IMPLANT
PACK BASIC VI WITH GOWN DISP (CUSTOM PROCEDURE TRAY) ×3 IMPLANT
PACK CYSTO (CUSTOM PROCEDURE TRAY) ×3 IMPLANT
PAD ABD 7.5X8 STRL (GAUZE/BANDAGES/DRESSINGS) ×6 IMPLANT
PROBE LITHOCLAST ULTRA 3.8X403 (UROLOGICAL SUPPLIES) ×3 IMPLANT
PROBE PNEUMATIC 1.0MMX570MM (UROLOGICAL SUPPLIES) ×3 IMPLANT
SET IRRIG Y TYPE TUR BLADDER L (SET/KITS/TRAYS/PACK) IMPLANT
SET WARMING FLUID IRRIGATION (MISCELLANEOUS) ×3 IMPLANT
SHEATH PEELAWAY SET 9 (SHEATH) ×3 IMPLANT
SPONGE GAUZE 4X4 12PLY (GAUZE/BANDAGES/DRESSINGS) ×3 IMPLANT
SPONGE LAP 4X18 X RAY DECT (DISPOSABLE) ×3 IMPLANT
STENT CONTOUR 6FRX24X.038 (STENTS) ×3 IMPLANT
STONE CATCHER W/TUBE ADAPTER (UROLOGICAL SUPPLIES) ×3 IMPLANT
SUT SILK 2 0 30  PSL (SUTURE) ×3
SUT SILK 2 0 30 PSL (SUTURE) ×6 IMPLANT
SYR 20CC LL (SYRINGE) ×6 IMPLANT
SYRINGE 10CC LL (SYRINGE) ×3 IMPLANT
TRAY FOLEY BAG SILVER LF 14FR (CATHETERS) IMPLANT
TRAY FOLEY CATH 14FRSI W/METER (CATHETERS) ×3 IMPLANT
TUBING CONNECTING 10 (TUBING) ×9 IMPLANT
WATER STERILE IRR 1500ML POUR (IV SOLUTION) ×3 IMPLANT
YANKAUER SUCT BULB TIP 10FT TU (MISCELLANEOUS) ×3 IMPLANT

## 2012-10-02 NOTE — H&P (Signed)
[name] is an 46 y.o. female.   Chief Complaint: Pre-Op Rt 1st Stage Percutaneous Nerphsotolithotomy HPI:  1 - Rt Complete Staghorn Kidney Stone, Left Renal Stone - Pt with complete rt staghorn stone, 625 HU and left lower pole 36mm (non-obstructing) stone foudn on eval for flank pain 08/2012 in ER. Skin to stone distance 11cm at closest point on Rt side due to obesity. No prior episodes. She does have h/o sarcoid, though recent calcium normal. Split funciton on renogram 08/2012 Rt 35%, Lt 65%, No obscturction.  Most recent UCX neg, though previously with Morganella sens to Bactrim, Amp, Zosyn.  PMH sig for morbid obesity, Sarcoid, HIV, Thydroid disease.  Today [name] seen to begin multi-stage surgery for her extremely complex stone disease.  Past Medical History  Diagnosis Date  . Hyperthyroidism 08/2006    Grave's Disease -diffuse radiotracer uptake 08/25/06 Thyroid scan-Cold nodule to R lower lobe of thyrorid  . HIV DISEASE 07/24/2006    dx March 05  . Sarcoidosis 02/08/2007    dx as a teenager in [place] from abnl CXR. Completed 2 yrs Prednisone after lung bx confirmation. No symptoms since then.  . THYROID NODULE, RIGHT 02/08/2007  . GRAVE'S DISEASE 01/01/2008  . 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 HYPERTENSION 07/24/2006  . ASTHMA 07/24/2006  . GERD 07/24/2006  . 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 Anemia     Normocytic  . History of hidradenitis suppurativa   . Gastroenteritis 07/10/07  . Menometrorrhagia     hx of  . History of thrush   . Bronchitis 2005  . Pneumonia 2005    Past Surgical History  Procedure Date  . Breast surgery 1997    Breast Reduction   . Total thyroidectomy 2010  . [place] 1994  . Dilation and curettage of uterus Feb 2004    s/p for 1st trimester nonviable pregnancy  . Incision and drainage peritonsillar abscess Mar 03  . Incise and drain abcess Nov 03    s/p I &D for righ inframmary fold hidradenitis  . Irrigation and  debridement abscess 01/31/2012    Procedure: IRRIGATION AND DEBRIDEMENT ABSCESS;  Surgeon: Shann Medal, MD;  Location: [facility];  Service: General;  Laterality: Right;  right breast and axilla   . Eye surgery     sty under eyelid    Family History  Problem Relation Age of Onset  . Cancer Other     FH of Breast Cancer  . Cancer Other     Fh of Lung Cancer  . Heart disease Other   . Hypertension Other   . Stroke Other     Grandparent  . Kidney disease Other     Grandparent  . Diabetes Other     FH of Diabetes  . Hypertension Mother   . Cancer Mother     throat  . Hypertension Father   . Hypertension Sister   . Cancer Maternal Uncle     Stomach Cancer   Social History:  reports that she has been smoking Cigarettes.  She has a 4.5 pack-year smoking history. She has never used smokeless tobacco. She reports that she does not drink alcohol or use illicit drugs.  Allergies:  Allergies  Allergen Reactions  . Tape Rash    Rash   . Lisinopril Cough  . Xylocaine (Lidocaine)     Unknown     Medications Prior to Admission  Medication Sig Dispense Refill  . acetaminophen (TYLENOL) 500 MG tablet Take 500 mg by mouth  every 6 (six) hours as needed. For pain      . albuterol (PROVENTIL HFA;VENTOLIN HFA) 108 (90 BASE) MCG/ACT inhaler Inhale 2 puffs into the lungs 4 (four) times daily as needed. For shortness of breath      . hydrochlorothiazide (MICROZIDE) 12.5 MG capsule Take 12.5 mg by mouth every morning.      Marland Kitchen ibuprofen (ADVIL,MOTRIN) 600 MG tablet Take 600 mg by mouth every 6 (six) hours as needed.      Marland Kitchen levothyroxine (SYNTHROID, LEVOTHROID) 200 MCG tablet Take 200 mcg by mouth every morning.       Marland Kitchen NIFEdipine (PROCARDIA XL/ADALAT-CC) 60 MG 24 hr tablet Take 60 mg by mouth every morning.       Marland Kitchen ARTIFICIAL TEARS ophthalmic solution Place 2 drops into both eyes every 4 (four) hours as needed. For dry eyes      . ATRIPLA 600-200-300 MG per tablet take 1 tablet by mouth at bedtime   30 tablet  10    No results found for this or any previous visit (from the past 48 hour(s)). No results found.  Review of Systems  Constitutional: Negative.  Negative for fever and chills.  HENT: Negative.   Respiratory: Negative.   Cardiovascular: Negative.   Gastrointestinal: Negative.   Genitourinary: Negative.   Musculoskeletal: Negative.   Skin: Negative.   Neurological: Negative.   Endo/Heme/Allergies: Negative.   Psychiatric/Behavioral: Negative.     Blood pressure 166/92, pulse 81, temperature 98 F (36.7 C), resp. rate 20, SpO2 100.00%. Physical Exam  Constitutional: She is oriented to person, place, and time. She appears well-developed and well-nourished.       obese  HENT:  Head: Normocephalic and atraumatic.  Eyes: EOM are normal. Pupils are equal, round, and reactive to light.  Neck: Normal range of motion. Neck supple.  Cardiovascular: Normal rate and regular rhythm.   Respiratory: Effort normal and breath sounds normal.  GI: Soft. Bowel sounds are normal.  Genitourinary:       Minimal CVAT  Musculoskeletal: Normal range of motion.  Neurological: She is alert and oriented to person, place, and time.  Skin: Skin is warm and dry.  Psychiatric: She has a normal mood and affect. Her behavior is normal. Judgment and thought content normal.     Assessment/Plan 1 - Rt Complete Staghorn Kidney Stone, Left Renal Stone - Very complex case. Rt renal funciton relatively preserved, and non-obsctructed, would certainly be optimal to try to save rt kidney. We discussed option of multi-stage PCNL (estimate 4 stages and multiple access) and Lt URS. I expalined that either way this was going to be a big life disrupter for 4 or more weeks once therapy is began.  We re-discussed percutaneous nephrostolithotomy (PCNL) in detail including need for percutaneous access which is sometimes gained by the surgeon, and other times by radiology or through existing nephrostomy tubes if  present. We specifically discussed that often times tubes remain in after surgery until we are confident all stone has been treated. We mentioned that staged surgery is needed in over 50% of cases of very large or complex stone. We then discussed general risks including bleeding, infection, damage to kidney / ureter / bladder, loss of kidney, as well as anesthetic.  Goal today is Rt access (possibly multiple) with 1st stage PCNL, Lt JJ stent (prestenting for later left URS).  Daisuke Bailey 10/02/2012, 6:49 AM

## 2012-10-02 NOTE — Anesthesia Preprocedure Evaluation (Addendum)
Anesthesia Evaluation  Patient identified by MRN, date of birth, ID band Patient awake  General Assessment Comment:HIV positive. Well controlled.  Reviewed: Allergy & Precautions, H&P , NPO status , Patient's Chart, lab work & pertinent test results, reviewed documented beta blocker date and time   Airway Mallampati: II TM Distance: >3 FB Neck ROM: full    Dental No notable dental hx.    Pulmonary asthma , pneumonia -, Current Smoker,  breath sounds clear to auscultation  Pulmonary exam normal       Cardiovascular Exercise Tolerance: Good hypertension, On Medications Rhythm:regular Rate:Normal     Neuro/Psych PSYCHIATRIC DISORDERS Depression negative neurological ROS     GI/Hepatic Neg liver ROS, GERD-  ,  Endo/Other  Hypothyroidism Hyperthyroidism Morbid obesity  Renal/GU negative Renal ROS  negative genitourinary   Musculoskeletal   Abdominal   Peds  Hematology negative hematology ROS (+) anemia ,   Anesthesia Other Findings   Reproductive/Obstetrics negative OB ROS                         Anesthesia Physical Anesthesia Plan  ASA: III  Anesthesia Plan: General ETT   Post-op Pain Management:    Induction:   Airway Management Planned:   Additional Equipment:   Intra-op Plan:   Post-operative Plan:   Informed Consent: I have reviewed the patients History and Physical, chart, labs and discussed the procedure including the risks, benefits and alternatives for the proposed anesthesia with the patient or authorized representative who has indicated his/her understanding and acceptance.   Dental Advisory Given  Plan Discussed with: CRNA  Anesthesia Plan Comments:        Anesthesia Quick Evaluation

## 2012-10-02 NOTE — Transfer of Care (Signed)
Immediate Anesthesia Transfer of Care Note  Patient: [name]  Procedure(s) Performed: Procedure(s) (LRB) with comments: NEPHROLITHOTOMY PERCUTANEOUS (Right) - First Stage Percutaneous Nephrolithotomy with Surgeon Access, Left Ureteral Stent   CYSTOSCOPY WITH STENT PLACEMENT (Left)  Patient Location: PACU  Anesthesia Type:General  Level of Consciousness: oriented, sedated and patient cooperative  Airway & Oxygen Therapy: Patient Spontanous Breathing and Patient connected to face mask oxygen  Post-op Assessment: Report given to PACU RN, Post -op Vital signs reviewed and stable and Patient moving all extremities  Post vital signs: Reviewed and stable  Complications: No apparent anesthesia complications

## 2012-10-02 NOTE — Anesthesia Postprocedure Evaluation (Signed)
  Anesthesia Post-op Note  Patient: [name]  Procedure(s) Performed: Procedure(s) (LRB): NEPHROLITHOTOMY PERCUTANEOUS (Right) CYSTOSCOPY WITH STENT PLACEMENT (Left)  Patient Location: PACU  Anesthesia Type: General  Level of Consciousness: awake and alert   Airway and Oxygen Therapy: Patient Spontanous Breathing  Post-op Pain: mild  Post-op Assessment: Post-op Vital signs reviewed, Patient's Cardiovascular Status Stable, Respiratory Function Stable, Patent Airway and No signs of Nausea or vomiting  Last Vitals:  Filed Vitals:   10/02/12 1315  BP: 145/74  Pulse: 67  Temp:   Resp: 19    Post-op Vital Signs: stable   Complications: No apparent anesthesia complications

## 2012-10-02 NOTE — Brief Op Note (Signed)
10/02/2012  11:57 AM  PATIENT:  Shanon Rosser  46 y.o. female  PRE-OPERATIVE DIAGNOSIS:  right staghorn kidney stoneys, left renal stone  POST-OPERATIVE DIAGNOSIS:  right staghorm kidney stones, left ureteral stone  PROCEDURE:  Procedure(s) (LRB) with comments: NEPHROLITHOTOMY PERCUTANEOUS (Right) - First Stage Percutaneous Nephrolithotomy with Surgeon Access, Left Ureteral Stent   CYSTOSCOPY WITH STENT PLACEMENT (Left)  SURGEON:  Surgeon(s) and Role:    * Alexis Frock, MD - Primary  PHYSICIAN ASSISTANT:   ASSISTANTS: none   ANESTHESIA:   general  EBL:  Total I/O In: 3000 [I.V.:3000] Out: 378 [Urine:228; Blood:150]  BLOOD ADMINISTERED:none  DRAINS: 1 - Rt nephroureteral stent x3, 2 - Rt upper pole nephrostomy, 3 - foley    LOCAL MEDICATIONS USED:  NONE  SPECIMEN:  Source of Specimen:  Rt Kidney - Stones  DISPOSITION OF SPECIMEN:  [facility] for compositional analysis  COUNTS:  YES  TOURNIQUET:  * No tourniquets in log *  DICTATION: .Other Dictation: Dictation Number  [id number]  PLAN OF CARE: Admit to inpatient   PATIENT DISPOSITION:  PACU - hemodynamically stable.   Delay start of Pharmacological VTE agent (>24hrs) due to surgical blood loss or risk of bleeding: yes

## 2012-10-03 ENCOUNTER — Other Ambulatory Visit: Payer: Self-pay | Admitting: Urology

## 2012-10-03 ENCOUNTER — Encounter (HOSPITAL_COMMUNITY): Payer: Self-pay | Admitting: Urology

## 2012-10-03 LAB — HEMOGLOBIN AND HEMATOCRIT, BLOOD: HCT: 30.9 % — ABNORMAL LOW (ref 36.0–46.0)

## 2012-10-03 NOTE — Progress Notes (Signed)
Pt's right flank dressing leaking, reinforced per MD order. Will continue to monitor. [name]

## 2012-10-03 NOTE — Op Note (Signed)
[name], [name]                ACCOUNT NO.:  [id number]  MEDICAL RECORD NO.:  [id number]  LOCATION:  [id number]                         FACILITY:  [facility]  PHYSICIAN:  Alexis Frock, MD     DATE OF BIRTH:  March 31, 1966  DATE OF PROCEDURE:  10/02/2012 DATE OF DISCHARGE:                              OPERATIVE REPORT   DIAGNOSES: 1. Left small renal stone. 2. 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  PROCEDURE: 1. Cystoscopy with bilateral retrograde pyelograms. 2. Placement of left ureteral stent, no tether. 3. Right needle access to the kidney with placement of nephroureteral     stent x3. 4. Dilation of right percutaneous tract x1. 5. Right first stage percutaneous nephrostolithotomy stone greater     than 2 cm. 6. Placement of right nephrostomy tube. 7. Right antegrade nephrostogram with interpretation.  ESTIMATED BLOOD LOSS:  150 mL.  COMPLICATIONS:  None.  SPECIMEN:  Right renal stone fragments for composition analysis.  DRAINS: 1. Right upper pole nephrostomy tube to straight drain. 2. Foley catheter straight drain. 3. Right upper pole, mid pole, lower pole nephroureteral stents x3,     these are capped.  FINDINGS: 1. Unremarkable left retrograde pyelogram with excellent placement of     left ureteral stent. 2. Complete right staghorn calculus, malrotated right kidney. 3. Complete fragmentation and removal of right upper pole stone. 4. Excellent placement of right nephroureteral stents x3. 5. No evidence of extravasation on final antegrade nephrostogram.  PROCEDURE IN DETAIL:  The patient being [name], was verified. Procedure being less dense right first stage percutaneous nephrostolithotomy was confirmed.  Procedure was carried out.  Time-out was performed.  Intravenous antibiotics administered.  General endotracheal anesthesia was introduced.  The patient was first placed into a low lithotomy position.  Sterile field created by prepping  and draping the patient's vagina, introitus, and proximal thighs using iodine x3.  Next, cystourethroscopy was performed using a 22-French rigid cystoscope with 30-degrees lens.  Inspection of urinary bladder revealed no diverticula, calcifications papular lesions.  There was blood coming from the right ureteral orifice.  The left ureteral orifice was cannulated using a 6-French end-hole catheter and left retrograde pyelogram was obtained.  Left retrograde pyelogram demonstrated a single left ureter, single system left kidney.  A 0.038 Glidewire was advanced at the level of the upper pole over which a new 6 x 24 double-J stent was placed for prestenting for later left ureteroscopy. Attention was directed to the right side.  The right ureteral orifice was cannulated using a 6-French end-hole catheter and right retrograde pyelogram obtained.  Right retrograde pyelogram demonstrated a single right ureter, single system right kidney.  There were large filling defect in all aspects consistent with known complete staghorn stone.  The end-hole catheter was advanced to level of the renal pelvis and a Foley catheter was placed per urethra to straight drain.  The right end-hole catheter was fashioned to this using a silk tie and IV extension tubing was connected and primed with iodinated contrast.  The patient was flipped into a prone position with flexion at her abdomen in order to move the kidney more superiorly as it was noted to  be very low towards her pelvis.  Chest rolls were applied, axillary rolls were applied.  The patient's knees were padded.  Sequential pressure devices were applied.  A sterile field was created by prepping and draping the patient's right flank using chlorhexidine gluconate.  Next attention was directed at upper pole access.  Using continuous fluoroscopy, suitable access point was found just above the 12th rib, entering the superior pole calyx at inferior mid point  using a bulls eye technique along the axis of the C-arm at angle of 25 degrees from horizontal.  Access to the upper pole was obtained.  Efflux of urine was seen.  A 0.038 Glidewire was advanced down the level of the ureter and exchanged for a super stiff wire over a Comfy catheter.  A dual lumen introducer was then advanced to the level of proximal ureter and a 2nd wire.  The Glidewire was advanced to the level of the urinary bladder and 2nd Sensor wire was advanced.  These were set aside.  Attention was then directed to the mid pole access.  Again using 18-gauge needle and continuous fluoroscopy, 25 degrees of horizontal, the suitable inferior lateral midpole calix was entered.  Similarly a Glidewire was advanced down the level of the ureter and exchanged for a superstiff wire over a Comfy catheter.  This was set aside.  Finally lower pole access was obtained using a similar technique, again advancing the wire down the ureter and exchanging it for a super stiff wire for Comfy catheter.  There appeared to be excellent access to all 3 regions of the kidney which represented separate areas of stone.   Attention was then directed at the dilation of the right superior tract and percutaneous nephrostolithotomy first stage.  The 57F balloon dilation apparatus was advanced under continuous fluoroscopy via the upper pole access along the super stiff wire in a suitable position.  It was inflated to pressure of 16 atmospheres, held for 90 seconds and the sheath was advanced into suitable position.  The balloon was removed and rigid nephroscopy was performed.  Rigid nephroscopy revealed excellent placement of the sheath directly in apposition to enormous conglomerative stone in the patient's right upper pole.  Next lithoclast ultrasound, pneumatic dual energy was applied to the stone for approximately 2 hours, completely fragmenting and ablating all visible stone that was accessible via this access  tract and radiographically confirmed removal of the upper pole area of stone.  Rigid graspers were used to grab representative fragments and set aside for compositional analysis.  Repeat inspection revealed no evidence of perforation.  The patient had been in prone position for over 3 hours at this point, and we had the plan on a staged procedure.  We felt this to be a suitable stopping point due to patient positioning concerns and finally a new Comfy catheter was placed over the remaining sensor safety wire to the level of the urinary bladder acting as a nephroureteral stent from the upper pole.  A 18- French Foley catheter was placed along the access tract to the upper pole, acting as nephrostomy tube.  This was sutured in place.  An antegrade nephrostogram revealed excellent placement with no evidence of extravasation.  Similarly a Comfy catheter was placed as nephroureteral stent over the mid pole and lower pole access points and the respective super stiff wire was removed.  The previously applied end-hole catheter was removed.  Final fluoroscopic images revealed excellent placement of all nephroureteral stents at distal end of the urinary  bladder. All drains were fashioned to the skin using silk.  Dry dressing was applied.  Procedure was then terminated.  The patient tolerated the procedure well.  There were no immediate periprocedural complications. The patient was taken to the postanesthesia care unit in stable condition.          ______________________________ Alexis Frock, MD     TM/MEDQ  D:  10/02/2012  T:  10/03/2012  Job:  [id number]

## 2012-10-03 NOTE — Progress Notes (Signed)
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  Doing we post-op. Tolerating diet. Pain controlled. Hgb acceptable. Neph tube and foley draining well.   Objective: Vital signs in last 24 hours: Temp:  [97.5 F (36.4 C)-99.4 F (37.4 C)] 99.4 F (37.4 C) (12/18 0529) Pulse Rate:  [59-88] 87  (12/18 0529) Resp:  [16-22] 16  (12/18 0529) BP: (129-162)/(67-85) 162/85 mmHg (12/18 0529) SpO2:  [91 %-100 %] 100 % (12/18 0529) Weight:  [115.7 kg (255 lb 1.2 oz)] 115.7 kg (255 lb 1.2 oz) (12/17 1400) Last BM Date: 10/01/12  Intake/Output from previous day: 12/17 0701 - 12/18 0700 In: 3750 [I.V.:3450; IV Piggyback:300] Out: 3108 [Urine:2958; Blood:150] Intake/Output this shift: Total I/O In: 200 [IV Piggyback:200] Out: 2225 [Urine:2225]  General appearance: alert, cooperative and appears stated age Head: Normocephalic, without obvious abnormality, atraumatic Eyes: conjunctivae/corneas clear. PERRL, EOM's intact. Fundi benign. Ears: normal TM's and external ear canals both ears Nose: Nares normal. Septum midline. Mucosa normal. No drainage or sinus tenderness. Throat: lips, mucosa, and tongue normal; teeth and gums normal Neck: no adenopathy, no carotid bruit, no JVD, supple, symmetrical, trachea midline and thyroid not enlarged, symmetric, no tenderness/mass/nodules Back: symmetric, no curvature. ROM normal. No CVA tenderness. Resp: clear to auscultation bilaterally Chest wall: no tenderness Cardio: regular rate and rhythm, S1, S2 normal, no murmur, click, rub or gallop GI: soft, non-tender; bowel sounds normal; no masses,  no organomegaly Extremities: extremities normal, atraumatic, no cyanosis or edema Pulses: 2+ and symmetric Skin: Skin color, texture, turgor normal. No rashes or lesions Lymph nodes: Cervical, supraclavicular, and axillary nodes  normal. Neurologic: Grossly normal Incision/Wound: Rt neph tube c/d/i with pink urine (no clots). All 3 rt nephroureteral stents in place at skin.   Lab Results:   Basename 10/03/12 0532 10/02/12 1251  WBC -- --  HGB 9.7* 10.3*  HCT 30.9* 31.5*  PLT -- --   BMET  Basename 10/02/12 1251  NA 134*  K 3.6  CL 99  CO2 24  GLUCOSE 199*  BUN 9  CREATININE 0.93  CALCIUM 8.6   PT/INR No results found for this basename: LABPROT:2,INR:2 in the last 72 hours ABG No results found for this basename: PHART:2,PCO2:2,PO2:2,HCO3:2 in the last 72 hours  Studies/Results: Dg Abd 1 View  10/02/2012  *RADIOLOGY REPORT*  Clinical Data: Images from a percutaneous nephrostomy  ABDOMEN - 1 VIEW  Comparison: CT, 08/26/2012.  Current retrograde ureteropyelography.  Findings: Five spot fluorographic images show the sequential placement of a scope followed by wires into the left intrarenal collecting system for placement of a Nephroureteral stent.  A large staghorn calculus fills most of the left intrarenal collecting system.   Original Report Authenticated By: Lajean Manes, M.D.    Dg Retrograde Pyelogram  10/02/2012  *RADIOLOGY REPORT*  Clinical Data: Right renal staghorn calculus.  Bilateral retrograde ureteral pyelography.  RETROGRADE PYELOGRAM  Comparison: CT, 08/26/2012  Fluoro time:  32 seconds.  Findings: The retrograde injection of the left ureter shows it to be normal course and caliber with no filling defects.  Normal left intrarenal collecting system.  There is a large staghorn calculus filling most of the intrarenal collecting system on the right.  Multiple filling defects are seen in the proximal mid right ureter.  The soft tissues are unremarkable.  IMPRESSION: Large right staghorn calculus.  Multiple filling  defects in the right ureter suggesting multiple small stones.   Original Report Authenticated By: Lajean Manes, M.D.    Dg C-arm Gt 120 Min-no Report  10/02/2012  CLINICAL DATA: kidney  stones   C-ARM GT 120 MINUTE  Fluoroscopy was utilized by the requesting physician.  No radiographic  interpretation.      Anti-infectives: Anti-infectives     Start     Dose/Rate Route Frequency Ordered Stop   10/02/12 2200  efavirenz-emtricitabine-tenofovir (ATRIPLA) 600-200-300 MG per tablet 1 tablet       1 tablet Oral Daily at bedtime 10/02/12 1413     10/01/12 1630   piperacillin-tazobactam (ZOSYN) IVPB 3.375 g        3.375 g 100 mL/hr over 30 Minutes Intravenous  Once 10/01/12 1615 10/02/12 0743          Assessment/Plan:  1 - Rt Complete Staghorn Kidney Stone in Malrotated Kidney + Small Left Renal Stone - Posted for second stage surgery tomorrow afternoon. She understands she will require several more procedures to get her stone free and that she will have option of going home for few days in between or staying in house over Christmas to expedite overall process.  - Keep current drains - Reinforced need for ambulation   [facility], Brandilee Pies 10/03/2012

## 2012-10-04 ENCOUNTER — Inpatient Hospital Stay (HOSPITAL_COMMUNITY): Payer: Medicaid Other | Admitting: Anesthesiology

## 2012-10-04 ENCOUNTER — Encounter (HOSPITAL_COMMUNITY): Payer: Self-pay | Admitting: Anesthesiology

## 2012-10-04 ENCOUNTER — Encounter (HOSPITAL_COMMUNITY)
Admission: RE | Disposition: A | Discharge status: Home / Self Care | Payer: Self-pay | Source: Ambulatory Visit | Attending: Urology

## 2012-10-04 ENCOUNTER — Inpatient Hospital Stay (HOSPITAL_COMMUNITY): Payer: Medicaid Other

## 2012-10-04 HISTORY — PX: NEPHROLITHOTOMY: SHX5134

## 2012-10-04 LAB — HEMOGLOBIN AND HEMATOCRIT, BLOOD: Hemoglobin: 11.7 g/dL — ABNORMAL LOW (ref 12.0–15.0)

## 2012-10-04 SURGERY — NEPHROLITHOTOMY PERCUTANEOUS SECOND LOOK
Anesthesia: General | Laterality: Right | Wound class: Clean Contaminated

## 2012-10-04 MED ORDER — PROMETHAZINE HCL 25 MG/ML IJ SOLN: 6.2500 mg | INTRAMUSCULAR | Status: DC | PRN

## 2012-10-04 MED ORDER — SUCCINYLCHOLINE CHLORIDE 20 MG/ML IJ SOLN
INTRAMUSCULAR | Status: DC | PRN
  Administered 2012-10-04: 140 mg via INTRAVENOUS

## 2012-10-04 MED ORDER — NICOTINE 14 MG/24HR TD PT24
14.0000 mg | MEDICATED_PATCH | Freq: Every day | TRANSDERMAL | Status: DC
  Administered 2012-10-04 – 2012-10-12 (×11): 14 mg via TRANSDERMAL
  Filled 2012-10-04 (×13): qty 1

## 2012-10-04 MED ORDER — DEXAMETHASONE SODIUM PHOSPHATE 10 MG/ML IJ SOLN
INTRAMUSCULAR | Status: DC | PRN
  Administered 2012-10-04: 10 mg via INTRAVENOUS

## 2012-10-04 MED ORDER — NEOSTIGMINE METHYLSULFATE 1 MG/ML IJ SOLN
INTRAMUSCULAR | Status: DC | PRN
  Administered 2012-10-04: 1 mg via INTRAVENOUS

## 2012-10-04 MED ORDER — SODIUM CHLORIDE 0.9 % IR SOLN
Status: DC | PRN
  Administered 2012-10-04: 36000 mL

## 2012-10-04 MED ORDER — GLYCOPYRROLATE 0.2 MG/ML IJ SOLN
INTRAMUSCULAR | Status: DC | PRN
  Administered 2012-10-04: 0.2 mg via INTRAVENOUS

## 2012-10-04 MED ORDER — MEPERIDINE HCL 50 MG/ML IJ SOLN: 6.2500 mg | INTRAMUSCULAR | Status: DC | PRN

## 2012-10-04 MED ORDER — ONDANSETRON HCL 4 MG/2ML IJ SOLN
INTRAMUSCULAR | Status: DC | PRN
  Administered 2012-10-04 (×4): 1 mg via INTRAVENOUS

## 2012-10-04 MED ORDER — ACETAMINOPHEN 10 MG/ML IV SOLN: 1000.0000 mg | Freq: Once | INTRAVENOUS | Status: DC | PRN

## 2012-10-04 MED ORDER — ACETAMINOPHEN 10 MG/ML IV SOLN
1000.0000 mg | Freq: Once | INTRAVENOUS | Status: AC
Start: 2012-10-04 — End: 2012-10-04
  Administered 2012-10-04: 1000 mg via INTRAVENOUS
  Filled 2012-10-04: qty 100

## 2012-10-04 MED ORDER — IOHEXOL 300 MG/ML  SOLN
INTRAMUSCULAR | Status: DC | PRN
  Administered 2012-10-04: 50 mL

## 2012-10-04 MED ORDER — OXYCODONE HCL 5 MG/5ML PO SOLN
5.0000 mg | Freq: Once | ORAL | Status: DC | PRN
  Filled 2012-10-04: qty 5

## 2012-10-04 MED ORDER — CISATRACURIUM BESYLATE (PF) 10 MG/5ML IV SOLN
INTRAVENOUS | Status: DC | PRN
  Administered 2012-10-04: 2 mg via INTRAVENOUS
  Administered 2012-10-04: 4 mg via INTRAVENOUS
  Administered 2012-10-04: 2 mg via INTRAVENOUS
  Administered 2012-10-04: 10 mg via INTRAVENOUS

## 2012-10-04 MED ORDER — PIPERACILLIN-TAZOBACTAM 3.375 G IVPB 30 MIN
3.3750 g | INTRAVENOUS | Status: DC
  Filled 2012-10-04: qty 50

## 2012-10-04 MED ORDER — MIDAZOLAM HCL 5 MG/5ML IJ SOLN
INTRAMUSCULAR | Status: DC | PRN
  Administered 2012-10-04: 1 mg via INTRAVENOUS

## 2012-10-04 MED ORDER — LACTATED RINGERS IV SOLN
INTRAVENOUS | Status: DC | PRN
  Administered 2012-10-04 (×2): via INTRAVENOUS

## 2012-10-04 MED ORDER — PIPERACILLIN-TAZOBACTAM 3.375 G IVPB 30 MIN
INTRAVENOUS | Status: DC | PRN
  Administered 2012-10-04: 3.375 g via INTRAVENOUS

## 2012-10-04 MED ORDER — HYDROMORPHONE HCL PF 1 MG/ML IJ SOLN: 0.2500 mg | INTRAMUSCULAR | Status: DC | PRN

## 2012-10-04 MED ORDER — PROPOFOL 10 MG/ML IV EMUL
INTRAVENOUS | Status: DC | PRN
  Administered 2012-10-04: 200 mg via INTRAVENOUS

## 2012-10-04 MED ORDER — OXYCODONE HCL 5 MG PO TABS: 5.0000 mg | ORAL_TABLET | Freq: Once | ORAL | Status: DC | PRN

## 2012-10-04 MED ORDER — LACTATED RINGERS IV SOLN
INTRAVENOUS | Status: DC
  Administered 2012-10-04: 1000 mL via INTRAVENOUS

## 2012-10-04 MED ORDER — FENTANYL CITRATE 0.05 MG/ML IJ SOLN
INTRAMUSCULAR | Status: DC | PRN
  Administered 2012-10-04 (×6): 50 ug via INTRAVENOUS

## 2012-10-04 SURGICAL SUPPLY — 43 items
BAG URINE DRAINAGE (UROLOGICAL SUPPLIES) ×2 IMPLANT
BASKET ZERO TIP NITINOL 2.4FR (BASKET) ×2 IMPLANT
BENZOIN TINCTURE PRP APPL 2/3 (GAUZE/BANDAGES/DRESSINGS) ×2 IMPLANT
BLADE SURG 15 STRL LF DISP TIS (BLADE) ×1 IMPLANT
BLADE SURG 15 STRL SS (BLADE) ×1
CATH BEACON 5.038 65CM KMP-01 (CATHETERS) ×2 IMPLANT
CATH FOLEY 2W COUNCIL 20FR 5CC (CATHETERS) IMPLANT
CATH ROBINSON RED A/P 20FR (CATHETERS) IMPLANT
CATH SILICONE 5CC 18FR (INSTRUMENTS) ×2 IMPLANT
CATH X-FORCE N30 NEPHROSTOMY (TUBING) ×2 IMPLANT
CLOTH BEACON ORANGE TIMEOUT ST (SAFETY) ×2 IMPLANT
COVER SURGICAL LIGHT HANDLE (MISCELLANEOUS) ×2 IMPLANT
DRAPE C-ARM 42X72 X-RAY (DRAPES) ×2 IMPLANT
DRAPE CAMERA CLOSED 9X96 (DRAPES) ×2 IMPLANT
DRAPE LINGEMAN PERC (DRAPES) ×2 IMPLANT
DRAPE SURG IRRIG POUCH 19X23 (DRAPES) ×2 IMPLANT
DRSG PAD ABDOMINAL 8X10 ST (GAUZE/BANDAGES/DRESSINGS) ×2 IMPLANT
DRSG TEGADERM 8X12 (GAUZE/BANDAGES/DRESSINGS) ×2 IMPLANT
GLOVE SURG SS PI 8.0 STRL IVOR (GLOVE) IMPLANT
GOWN STRL REIN XL XLG (GOWN DISPOSABLE) ×6 IMPLANT
GUIDEWIRE AMPLAZ .035X145 (WIRE) ×2 IMPLANT
KIT BASIN OR (CUSTOM PROCEDURE TRAY) ×2 IMPLANT
LASER FIBER DISP (UROLOGICAL SUPPLIES) IMPLANT
LASER FIBER DISP 1000U (UROLOGICAL SUPPLIES) IMPLANT
MANIFOLD NEPTUNE II (INSTRUMENTS) ×2 IMPLANT
NS IRRIG 1000ML POUR BTL (IV SOLUTION) IMPLANT
PACK BASIC VI WITH GOWN DISP (CUSTOM PROCEDURE TRAY) ×2 IMPLANT
PAD ABD 7.5X8 STRL (GAUZE/BANDAGES/DRESSINGS) ×4 IMPLANT
PLUG CATH AND CAP STER (CATHETERS) ×2 IMPLANT
PROBE LITHOCLAST ULTRA 3.8X403 (UROLOGICAL SUPPLIES) ×2 IMPLANT
PROBE PNEUMATIC 1.0MMX570MM (UROLOGICAL SUPPLIES) ×2 IMPLANT
SET IRRIG Y TYPE TUR BLADDER L (SET/KITS/TRAYS/PACK) ×2 IMPLANT
SET WARMING FLUID IRRIGATION (MISCELLANEOUS) IMPLANT
SPONGE GAUZE 4X4 12PLY (GAUZE/BANDAGES/DRESSINGS) ×2 IMPLANT
SPONGE LAP 4X18 X RAY DECT (DISPOSABLE) ×2 IMPLANT
STONE CATCHER W/TUBE ADAPTER (UROLOGICAL SUPPLIES) ×2 IMPLANT
SUT SILK 2 0 30  PSL (SUTURE) ×1
SUT SILK 2 0 30 PSL (SUTURE) ×1 IMPLANT
SYR 20CC LL (SYRINGE) ×2 IMPLANT
SYRINGE 10CC LL (SYRINGE) ×2 IMPLANT
TOWEL OR NON WOVEN STRL DISP B (DISPOSABLE) ×2 IMPLANT
TRAY FOLEY CATH 14FRSI W/METER (CATHETERS) IMPLANT
TUBING CONNECTING 10 (TUBING) ×4 IMPLANT

## 2012-10-04 NOTE — Preoperative (Signed)
Beta Blockers   Reason not to administer Beta Blockers:Not Applicable 

## 2012-10-04 NOTE — Brief Op Note (Signed)
10/02/2012 - 10/04/2012  8:46 PM  PATIENT:  [name]  46 y.o. female  PRE-OPERATIVE DIAGNOSIS:  RIGHT RENAL STONE  POST-OPERATIVE DIAGNOSIS:  RIGHT RENAL STONE  PROCEDURE:  Procedure(s) (LRB) with comments: NEPHROLITHOTOMY PERCUTANEOUS SECOND LOOK (Right) -    SURGEON:  Surgeon(s) and Role:    * Alexis Frock, MD - Primary  PHYSICIAN ASSISTANT:   ASSISTANTS: none   ANESTHESIA:   general  EBL:     BLOOD ADMINISTERED:none  DRAINS: nephrostomy tube lower pole, nephroureteral stent x 3, nephrostomy tube upper pole, foley   LOCAL MEDICATIONS USED:  NONE  SPECIMEN:  Source of Specimen:  Rt Kidney  DISPOSITION OF SPECIMEN:  To Patient  COUNTS:  YES  TOURNIQUET:  * No tourniquets in log *  DICTATION: .Other Dictation: Dictation Number [id number]  PLAN OF CARE: Admit to inpatient   PATIENT DISPOSITION:  PACU - hemodynamically stable.   Delay start of Pharmacological VTE agent (>24hrs) due to surgical blood loss or risk of bleeding: yes

## 2012-10-04 NOTE — Progress Notes (Signed)
   CARE MANAGEMENT NOTE 10/04/2012  Patient:  [name], [name]   Account Number:  [id number]  Date Initiated:  10/04/2012  Documentation initiated by:  Olga Coaster  Subjective/Objective Assessment:   ADMITTED FOR SURGERY - Cystoscopy with bilateral retrograde pyelograms, Placement of left ureteral stent,     Action/Plan:   LIVES AT HOME WITH FAMILY   Anticipated DC Date:  10/11/2012   Anticipated DC Plan:  HOME/SELF CARE      Status of service:  In process, will continue to follow Medicare Important Message given?  NA - LOS <3 / Initial given by admissions (If response is "NO", the following Medicare IM given date fields will be blank)  Per UR Regulation:  Reviewed for med. necessity/level of care/duration of stay  Comments:  10/04/2012- B Shilo Pauwels RN,BSN,MHA

## 2012-10-04 NOTE — Anesthesia Preprocedure Evaluation (Addendum)
Anesthesia Evaluation  Patient identified by MRN, date of birth, ID band Patient awake  General Assessment Comment:HIV positive. Well controlled.  Reviewed: Allergy & Precautions, H&P , NPO status , Patient's Chart, lab work & pertinent test results, reviewed documented beta blocker date and time   Airway Mallampati: II TM Distance: >3 FB Neck ROM: full    Dental No notable dental hx. (+) Dental Advisory Given and Teeth Intact   Pulmonary asthma , pneumonia -, resolved, Current Smoker,  breath sounds clear to auscultation  Pulmonary exam normal       Cardiovascular Exercise Tolerance: Good hypertension, Pt. on medications Rhythm:regular Rate:Normal     Neuro/Psych PSYCHIATRIC DISORDERS Depression negative neurological ROS     GI/Hepatic Neg liver ROS, GERD-  Medicated,  Endo/Other  Hypothyroidism Hyperthyroidism Morbid obesity  Renal/GU negative Renal ROS  negative genitourinary   Musculoskeletal negative musculoskeletal ROS (+)   Abdominal   Peds  Hematology negative hematology ROS (+) anemia ,   Anesthesia Other Findings   Reproductive/Obstetrics negative OB ROS                        Anesthesia Physical Anesthesia Plan  ASA: III  Anesthesia Plan: General   Post-op Pain Management:    Induction: Intravenous  Airway Management Planned: Oral ETT  Additional Equipment:   Intra-op Plan:   Post-operative Plan: Extubation in OR  Informed Consent: I have reviewed the patients History and Physical, chart, labs and discussed the procedure including the risks, benefits and alternatives for the proposed anesthesia with the patient or authorized representative who has indicated his/her understanding and acceptance.   Dental advisory given  Plan Discussed with: CRNA  Anesthesia Plan Comments:         Anesthesia Quick Evaluation                                  Anesthesia Evaluation   Patient identified by MRN, date of birth, ID band Patient awake  General Assessment Comment:HIV positive. Well controlled.  Reviewed: Allergy & Precautions, H&P , NPO status , Patient's Chart, lab work & pertinent test results, reviewed documented beta blocker date and time   Airway Mallampati: II TM Distance: >3 FB Neck ROM: full    Dental No notable dental hx.    Pulmonary asthma , pneumonia -, Current Smoker,  breath sounds clear to auscultation  Pulmonary exam normal       Cardiovascular Exercise Tolerance: Good hypertension, On Medications Rhythm:regular Rate:Normal     Neuro/Psych PSYCHIATRIC DISORDERS Depression negative neurological ROS     GI/Hepatic Neg liver ROS, GERD-  ,  Endo/Other  Hypothyroidism Hyperthyroidism Morbid obesity  Renal/GU negative Renal ROS  negative genitourinary   Musculoskeletal   Abdominal   Peds  Hematology negative hematology ROS (+) anemia ,   Anesthesia Other Findings   Reproductive/Obstetrics negative OB ROS                         Anesthesia Physical Anesthesia Plan  ASA: III  Anesthesia Plan: General ETT   Post-op Pain Management:    Induction:   Airway Management Planned:   Additional Equipment:   Intra-op Plan:   Post-operative Plan:   Informed Consent:  Anesthesia Evaluation  Patient identified by MRN, date of birth, ID band Patient awake  General Assessment Comment:HIV positive. Well controlled.  Reviewed: Allergy & Precautions, H&P , NPO status , Patient's Chart, lab work & pertinent test results, reviewed documented beta blocker date and time   Airway Mallampati: II TM Distance: >3 FB Neck ROM: full    Dental No notable dental hx.    Pulmonary asthma , pneumonia -, Current Smoker,  breath sounds clear to auscultation  Pulmonary exam normal       Cardiovascular Exercise Tolerance: Good hypertension, On  Medications Rhythm:regular Rate:Normal     Neuro/Psych PSYCHIATRIC DISORDERS Depression negative neurological ROS     GI/Hepatic Neg liver ROS, GERD-  ,  Endo/Other  Hypothyroidism Hyperthyroidism Morbid obesity  Renal/GU negative Renal ROS  negative genitourinary   Musculoskeletal   Abdominal   Peds  Hematology negative hematology ROS (+) anemia ,   Anesthesia Other Findings   Reproductive/Obstetrics negative OB ROS                         Anesthesia Physical Anesthesia Plan  ASA: III  Anesthesia Plan: General ETT   Post-op Pain Management:    Induction:   Airway Management Planned:   Additional Equipment:   Intra-op Plan:   Post-operative Plan:   Informed Consent: I have reviewed the patients History and Physical, chart, labs and discussed the procedure including the risks, benefits and alternatives for the proposed anesthesia with the patient or authorized representative who has indicated his/her understanding and acceptance.   Dental Advisory Given  Plan Discussed with: CRNA  Anesthesia Plan Comments:        Anesthesia Quick Evaluation I have reviewed the patients History and Physical, chart, labs and discussed the procedure including the risks, benefits and alternatives for the proposed anesthesia with the patient or authorized representative who has indicated his/her understanding and acceptance.   Dental Advisory Given  Plan Discussed with: CRNA  Anesthesia Plan Comments:        Anesthesia Quick Evaluation

## 2012-10-04 NOTE — Transfer of Care (Signed)
Immediate Anesthesia Transfer of Care Note  Patient: [name]  Procedure(s) Performed: Procedure(s) (LRB) with comments: NEPHROLITHOTOMY PERCUTANEOUS SECOND LOOK (Right) -    Patient Location: PACU  Anesthesia Type:General  Level of Consciousness: awake, alert , oriented and patient cooperative  Airway & Oxygen Therapy: Patient Spontanous Breathing and Patient connected to face mask oxygen  Post-op Assessment: Report given to PACU RN, Post -op Vital signs reviewed and stable and Patient moving all extremities  Post vital signs: Reviewed and stable  Complications: No apparent anesthesia complications

## 2012-10-04 NOTE — Progress Notes (Signed)
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  Doing we post-op. Tolerating diet. Pain controlled. Hgb acceptable. Neph tube and foley draining well. Pt with headache and edginess this AM c/w nicotene withdrawel.  Objective: Vital signs in last 24 hours: Temp:  [98.8 F (37.1 C)-100.2 F (37.9 C)] 99.2 F (37.3 C) (12/19 0603) Pulse Rate:  [78-93] 85  (12/19 0509) Resp:  [18] 18  (12/19 0509) BP: (146-188)/(68-83) 151/68 mmHg (12/19 0603) SpO2:  [93 %-98 %] 98 % (12/19 0509) Last BM Date: 10/01/12  Intake/Output from previous day: 12/18 0701 - 12/19 0700 In: 2455 [P.O.:730; I.V.:1725] Out: 5661 [Urine:5661] Intake/Output this shift: Total I/O In: 1096.3 [P.O.:250; I.V.:846.3] Out: 1861 [Urine:1861]  General appearance: alert, cooperative and appears stated age Head: Normocephalic, without obvious abnormality, atraumatic Eyes: conjunctivae/corneas clear. PERRL, EOM's intact. Fundi benign. Ears: normal TM's and external ear canals both ears Nose: Nares normal. Septum midline. Mucosa normal. No drainage or sinus tenderness. Throat: lips, mucosa, and tongue normal; teeth and gums normal Back: symmetric, no curvature. ROM normal. No CVA tenderness., Rt neph tubes and nephroureteral stent x3 in good position with pink drainage. Resp: clear to auscultation bilaterally and some baseline smoker's cough Cardio: regular rate and rhythm, S1, S2 normal, no murmur, click, rub or gallop GI: soft, non-tender; bowel sounds normal; no masses,  no organomegaly Extremities: extremities normal, atraumatic, no cyanosis or edema Skin: Skin color, texture, turgor normal. No rashes or lesions Lymph nodes: Cervical, supraclavicular, and axillary nodes normal. Neurologic: Grossly normal Incision/Wound:  Lab Results:   Basename 10/03/12 0532 10/02/12 1251  WBC --  --  HGB 9.7* 10.3*  HCT 30.9* 31.5*  PLT -- --   BMET  Basename 10/02/12 1251  NA 134*  K 3.6  CL 99  CO2 24  GLUCOSE 199*  BUN 9  CREATININE 0.93  CALCIUM 8.6   PT/INR No results found for this basename: LABPROT:2,INR:2 in the last 72 hours ABG No results found for this basename: PHART:2,PCO2:2,PO2:2,HCO3:2 in the last 72 hours  Studies/Results: Dg Abd 1 View  10/02/2012  *RADIOLOGY REPORT*  Clinical Data: Images from a percutaneous nephrostomy  ABDOMEN - 1 VIEW  Comparison: CT, 08/26/2012.  Current retrograde ureteropyelography.  Findings: Five spot fluorographic images show the sequential placement of a scope followed by wires into the left intrarenal collecting system for placement of a Nephroureteral stent.  A large staghorn calculus fills most of the left intrarenal collecting system.   Original Report Authenticated By: Lajean Manes, M.D.    Dg Retrograde Pyelogram  10/02/2012  *RADIOLOGY REPORT*  Clinical Data: Right renal staghorn calculus.  Bilateral retrograde ureteral pyelography.  RETROGRADE PYELOGRAM  Comparison: CT, 08/26/2012  Fluoro time:  32 seconds.  Findings: The retrograde injection of the left ureter shows it to be normal course and caliber with no filling defects.  Normal left intrarenal collecting system.  There is a large staghorn calculus filling most of the intrarenal collecting system on the right.  Multiple filling defects are seen in the proximal mid right ureter.  The soft tissues are unremarkable.  IMPRESSION: Large right staghorn calculus.  Multiple filling defects in the right ureter suggesting multiple small stones.   Original Report Authenticated By: Lajean Manes, M.D.    Dg C-arm Gt 120 Min-no Report  10/02/2012  CLINICAL DATA: kidney stones   C-ARM  GT 120 MINUTE  Fluoroscopy was utilized by the requesting physician.  No radiographic  interpretation.      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  - Keep current drains - Reinforced need for incentive spirometry - Nicotene patch.  [facility], Serenidy Waltz 10/04/2012

## 2012-10-05 ENCOUNTER — Other Ambulatory Visit: Payer: Self-pay | Admitting: Urology

## 2012-10-05 ENCOUNTER — Encounter (HOSPITAL_COMMUNITY): Payer: Self-pay | Admitting: Urology

## 2012-10-05 LAB — BASIC METABOLIC PANEL
CO2: 29 mEq/L (ref 19–32)
Calcium: 9.2 mg/dL (ref 8.4–10.5)
GFR calc non Af Amer: 81 mL/min — ABNORMAL LOW (ref >90)
Sodium: 138 mEq/L (ref 135–145)

## 2012-10-05 LAB — HEMOGLOBIN AND HEMATOCRIT, BLOOD: HCT: 31.5 % — ABNORMAL LOW (ref 36.0–46.0)

## 2012-10-05 MED ORDER — DIPHENHYDRAMINE HCL 25 MG PO CAPS
25.0000 mg | ORAL_CAPSULE | Freq: Four times a day (QID) | ORAL | Status: DC | PRN
  Administered 2012-10-06: 25 mg via ORAL
  Filled 2012-10-05 (×2): qty 1

## 2012-10-05 MED ORDER — SULFAMETHOXAZOLE-TMP DS 800-160 MG PO TABS
1.0000 | ORAL_TABLET | Freq: Two times a day (BID) | ORAL | Status: DC
  Administered 2012-10-05 – 2012-10-12 (×15): 1 via ORAL
  Filled 2012-10-05 (×16): qty 1

## 2012-10-05 NOTE — Progress Notes (Signed)
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  Doing we post-op. Ambulatory. Tollerating Diet. No fevers.  Objective: Vital signs in last 24 hours: Temp:  [98.1 F (36.7 C)-98.3 F (36.8 C)] 98.2 F (36.8 C) (12/20 1500) Pulse Rate:  [68-88] 88  (12/20 1500) Resp:  [18-24] 18  (12/20 1500) BP: (135-163)/(73-90) 163/79 mmHg (12/20 1500) SpO2:  [93 %-100 %] 96 % (12/20 1500) Last BM Date: 10/01/12  Intake/Output from previous day: 12/19 0701 - 12/20 0700 In: 2390 [P.O.:240; I.V.:1800] Out: 4750 [Urine:4750] Intake/Output this shift: Total I/O In: 480 [P.O.:480] Out: 1400 [Urine:1400]  General appearance: alert, cooperative and appears stated age Head: Normocephalic, without obvious abnormality, atraumatic Eyes: conjunctivae/corneas clear. PERRL, EOM's intact. Fundi benign. Ears: normal TM's and external ear canals both ears Nose: Nares normal. Septum midline. Mucosa normal. No drainage or sinus tenderness. Throat: lips, mucosa, and tongue normal; teeth and gums normal Back: symmetric, no curvature. ROM normal. No CVA tenderness. Resp: clear to auscultation bilaterally Cardio: regular rate and rhythm, S1, S2 normal, no murmur, click, rub or gallop GI: soft, non-tender; bowel sounds normal; no masses,  no organomegaly Extremities: extremities normal, atraumatic, no cyanosis or edema Pulses: 2+ and symmetric Skin: Skin color, texture, turgor normal. No rashes or lesions Lymph nodes: Cervical, supraclavicular, and axillary nodes normal. Neurologic: Grossly normal Incision/Wound: Rt lower pole neph tube to drainage, light pink. Mid and upper pole tubes c/d/i. Foley with light pink urine.  Lab Results:   Basename 10/05/12 0512 10/04/12 2100  WBC -- --  HGB 10.2* 11.7*  HCT  31.5* 35.9*  PLT -- --   BMET  Basename 10/05/12 0512  NA 138  K 3.7  CL 100  CO2 29  GLUCOSE 118*  BUN 9  CREATININE 0.86  CALCIUM 9.2   PT/INR No results found for this basename: LABPROT:2,INR:2 in the last 72 hours ABG No results found for this basename: PHART:2,PCO2:2,PO2:2,HCO3:2 in the last 72 hours  Studies/Results: Dg Abd 1 View  10/05/2012  *RADIOLOGY REPORT*  Clinical Data: Intraoperative imaging  ABDOMEN - 1 VIEW  Comparison: 10/02/2012  Findings: The three Nephro ureteral catheters are redemonstrated. The staghorn calculus is noted in place.  IMPRESSION: Intraoperative imaging for percutaneous nephrolithotomy.   Original Report Authenticated By: Marybelle Killings, M.D.    Dg C-arm Gt 120 Min-no Report  10/04/2012  CLINICAL DATA: surgery   C-ARM GT 120 MINUTE  Fluoroscopy was utilized by the requesting physician.  No radiographic  interpretation.      Anti-infectives: Anti-infectives     Start     Dose/Rate Route Frequency Ordered Stop   10/05/12 1000  sulfamethoxazole-trimethoprim (BACTRIM DS) 800-160 MG per tablet 1 tablet       1 tablet Oral Every 12 hours 10/05/12 0633     10/04/12 0714   piperacillin-tazobactam (ZOSYN) IVPB 3.375 g  Status:  Discontinued        3.375 g 100 mL/hr over 30 Minutes Intravenous 30 min pre-op 10/04/12 0707 10/04/12 2258   10/02/12 2200  efavirenz-emtricitabine-tenofovir (ATRIPLA) 600-200-300 MG per tablet 1 tablet       1 tablet Oral Daily at bedtime 10/02/12 1413     10/01/12 1630   piperacillin-tazobactam (ZOSYN) IVPB 3.375 g        3.375 g 100  mL/hr over 30 Minutes Intravenous  Once 10/01/12 1615 10/02/12 0743          Assessment/Plan:  1 - 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 She is posted for 3rd stage on Monday at which point we will focus on mid pole stones. I estimate it will take at least 2 more surgeries to get  her stone free.  - Keep current drains - Maintain daily ambulation    [facility], Shiro Ellerman 10/05/2012

## 2012-10-05 NOTE — Progress Notes (Signed)
Pt transferred from PACU to rm 1423. Report received from transferring RN. VS taken and assessment performed. Pt resting comfortably in bed with no complaints and no signs of distress at this time. Family in the room. Will continue to monitor. - Carmon Ginsberg, RN

## 2012-10-05 NOTE — Anesthesia Postprocedure Evaluation (Signed)
Anesthesia Post Note  Patient: [name]  Procedure(s) Performed: Procedure(s) (LRB): NEPHROLITHOTOMY PERCUTANEOUS SECOND LOOK (Right)  Anesthesia type: General  Patient location: PACU  Post pain: Pain level controlled  Post assessment: Post-op Vital signs reviewed  Last Vitals: BP 137/73  Pulse 80  Temp 36.8 C (Oral)  Resp 20  Ht 5\' 5"  (1.651 m)  Wt 255 lb 1.2 oz (115.7 kg)  BMI 42.45 kg/m2  SpO2 94%  Post vital signs: Reviewed  Level of consciousness: sedated  Complications: No apparent anesthesia complications

## 2012-10-05 NOTE — Op Note (Signed)
[name], [name]                ACCOUNT NO.:  [id number]  MEDICAL RECORD NO.:  [id number]  LOCATION:  35                         FACILITY:  [facility]  PHYSICIAN:  Alexis Frock, MD     DATE OF BIRTH:  05-23-1966  DATE OF PROCEDURE:  10/04/2012 DATE OF DISCHARGE:                              OPERATIVE REPORT   DIAGNOSIS:  Residual fragments of Right staghorn kidney stone.  PROCEDURE: 1. Second-stage right percutaneous nephrostolithotomy stone greater     than 2 cm. 2. Dilation of percutaneous tract. 3. Antegrade nephrostogram with interpretation. 4. Placement of right nephrostomy tube.  COMPLICATIONS:  None.  SPECIMENS:  Right lower pole renal stones given to the patient.  FINDINGS: 1. Massive amount of stone in the lower pole. 2. Excellent placement of all drainage tubes.  No evidence of     extravasation following percutaneous nephrostolithotomy.  COMPLICATIONS:  None.  ESTIMATED BLOOD LOSS:  100 mL.  INDICATION:  [name] is a pleasant 46 year old lady with right complete staghorn kidney calculus, who is undergoing stage percutaneous approach to management of this. She underwent first stage procedure on October 02, 2012 at which point we addressed predominantly her upper pole of the right kidney.  She now presents for second-stage procedure with plan to address the lower pole stone, notably 3 percutaneous tracts that had been previously placed, however, dilation only of one had been previously performed.  PROCEDURE IN DETAIL:  The patient being [name], was verified. Procedure being right second stage percutaneous nephrostolithotomy was confirmed.  Procedure was carried out.  Time-out was performed. Intravenous antibiotics were administered.  General endotracheal anesthesia was introduced.  The patient was placed in a prone position, applying prone view, chest rolls, padding of the knees, sequential pressure devices.  Sterile field was created by prepping  and draping the patient's right flank.  All prior tubes were prepped in the operative field.  A percutaneous drape was applied.  Right antegrade nephrostogram was then obtained.  Retrograde nephrostogram demonstrated large filling defect in the mid and lower poles consistent with residual staghorn calculus fragments. The lower pole nephroureteral stent was exchanged for a superstiff wire, over which a new 30-French balloon dilation apparatus was carefully advanced into the lower pole.  This was then inflated to a pressure of 18 atmospheres for 90 seconds.  A sheath was applied.  Rigid nephroscopy was then performed.  This revealed excellent placement of the sheath and directed position to large burden of lower pole stone.  Next lithoclast dual ultrasound - pneumatic  energy was applied to the stone approximately 2-1/2 hours alternating between lithoclast energy and basketing of stone fragments.  At least 90% of the lower pole stone volume was addressed in this fashion.  At this point the patient has been in prone position approximately 3 hours and all easily accessible stone via this tract had been managed until the conclusion of this stage is warranted.  A nephroureteral stent was once again placed over the lower pole super stiff wire at the level of the urinary bladder.  A new [id number] silicone Foley catheter was then placed through the sheath into the area of the lower pole with no  fluid in the balloon acting as a ephrostomy tube.  Sheath was carefully removed. Nephrostomy tube and nephroureteral stents were sutured in place.  Final antegrade nephrostogram demonstrated no extravasation of contrast and excellent placement of all tubes and significant reduction in the stone burden.  All drainage tubes were connected to straight drainage. Percutaneous dressing was applied.  The procedure was terminated.  The patient tolerated the procedure well.  There were no immediate periprocedural  complications.  The patient was taken to the postanesthesia care unit in stable condition.          ______________________________ Alexis Frock, MD     TM/MEDQ  D:  10/04/2012  T:  10/05/2012  Job:  ID:[id number]

## 2012-10-06 NOTE — Progress Notes (Signed)
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  Ambulating. Tolerating regular diet. Pain well controlled. Explained that we cannot safely start heparin, so I recommend she be compliant with SCD's for DVT prophylaxis.  Complains of some itching- better with benadryl.  Objective: Vital signs in last 24 hours: Temp:  [98.2 F (36.8 C)-98.5 F (36.9 C)] 98.5 F (36.9 C) (12/21 0409) Pulse Rate:  [79-92] 79  (12/21 0409) Resp:  [16-18] 16  (12/21 0409) BP: (151-163)/(75-80) 153/80 mmHg (12/21 0409) SpO2:  [96 %-99 %] 99 % (12/21 0409) Last BM Date: 10/01/12  Intake/Output from previous day: 12/20 0701 - 12/21 0700 In: 1620 [P.O.:720; I.V.:900] Out: 3050 [Urine:3050] Intake/Output this shift:    Filed Vitals:   10/06/12 0409  BP: 153/80  Pulse: 79  Temp: 98.5 F (36.9 C)  Resp: 16   Gen: NAD, AAO Abd: soft, NTTP, obese CV: RRR, no murmur Chest: CTA-B, equal effort bilaterally GU: foley draining clear urine, right nephrostomy in place draining pink urine Back: right nephrostomy tubes in place, some edema on the skin around her dressing.  Lab Results:   Basename 10/05/12 0512 10/04/12 2100  WBC -- --  HGB 10.2* 11.7*  HCT 31.5* 35.9*  PLT -- --   BMET  Basename 10/05/12 0512  NA 138  K 3.7  CL 100  CO2 29  GLUCOSE 118*  BUN 9  CREATININE 0.86  CALCIUM 9.2   PT/INR No results found for this basename: LABPROT:2,INR:2 in the last 72 hours ABG No results found for this basename: PHART:2,PCO2:2,PO2:2,HCO3:2 in the last 72 hours  Studies/Results: Dg Abd 1 View  10/05/2012  *RADIOLOGY REPORT*  Clinical Data: Intraoperative imaging  ABDOMEN - 1 VIEW  Comparison: 10/02/2012  Findings: The three Nephro ureteral catheters are redemonstrated. The staghorn calculus  is noted in place.  IMPRESSION: Intraoperative imaging for percutaneous nephrolithotomy.   Original Report Authenticated By: Marybelle Killings, M.D.    Dg C-arm Gt 120 Min-no Report  10/04/2012  CLINICAL DATA: surgery   C-ARM GT 120 MINUTE  Fluoroscopy was utilized by the requesting physician.  No radiographic  interpretation.      Anti-infectives: Anti-infectives     Start     Dose/Rate Route Frequency Ordered Stop   10/05/12 1000   sulfamethoxazole-trimethoprim (BACTRIM DS) 800-160 MG per tablet 1 tablet        1 tablet Oral Every 12 hours 10/05/12 0633     10/04/12 0714   piperacillin-tazobactam (ZOSYN) IVPB 3.375 g  Status:  Discontinued        3.375 g 100 mL/hr over 30 Minutes Intravenous 30 min pre-op 10/04/12 0707 10/04/12 2258   10/02/12 2200  efavirenz-emtricitabine-tenofovir (ATRIPLA) 600-200-300 MG per tablet 1 tablet       1 tablet Oral Daily at bedtime 10/02/12 1413     10/01/12 1630   piperacillin-tazobactam (ZOSYN) IVPB 3.375 g        3.375 g 100 mL/hr over 30 Minutes Intravenous  Once 10/01/12 1615 10/02/12 0743          Assessment/Plan:  1 - 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 She is posted for 3rd stage on Monday at which point we will focus on mid  pole stones.   - Keep current drains - Maintain daily ambulation  -Nurse to change dressing and use hypafix instead of tegaderm. -Replace SCD's -Will check H&H and BMP in the morning.   Molli Hazard 10/06/2012

## 2012-10-07 LAB — BASIC METABOLIC PANEL
Calcium: 8.9 mg/dL (ref 8.4–10.5)
Creatinine, Ser: 0.85 mg/dL (ref 0.50–1.10)
GFR calc Af Amer: >90 mL/min (ref >90)

## 2012-10-07 LAB — HEMOGLOBIN AND HEMATOCRIT, BLOOD: Hemoglobin: 9.1 g/dL — ABNORMAL LOW (ref 12.0–15.0)

## 2012-10-07 MED ORDER — POTASSIUM CHLORIDE CRYS ER 20 MEQ PO TBCR
20.0000 meq | EXTENDED_RELEASE_TABLET | Freq: Three times a day (TID) | ORAL | Status: AC
  Administered 2012-10-07 (×3): 20 meq via ORAL
  Filled 2012-10-07 (×3): qty 1

## 2012-10-07 MED ORDER — BISACODYL 10 MG RE SUPP
10.0000 mg | Freq: Two times a day (BID) | RECTAL | Status: DC
  Administered 2012-10-07 – 2012-10-11 (×4): 10 mg via RECTAL
  Filled 2012-10-07 (×6): qty 1

## 2012-10-07 NOTE — Progress Notes (Signed)
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  Positive ambulation, flatus. Tolerating regular diet. Pain well controlled. Hypafix working better for skin discomfort.  ROS:  Negative: SOB  Objective: Vital signs in last 24 hours: Temp:  [98.1 F (36.7 C)-98.9 F (37.2 C)] 98.1 F (36.7 C) (12/22 0658) Pulse Rate:  [72-99] 72  (12/22 0658) Resp:  [16-24] 20  (12/22 0658) BP: (139-151)/(75-79) 139/75 mmHg (12/22 0658) SpO2:  [96 %-99 %] 97 % (12/22 0658) Last BM Date: 10/01/12  Intake/Output from previous day: 12/21 0701 - 12/22 0700 In: 3600 [P.O.:2640; I.V.:960] Out: 2792 [Urine:2792] Intake/Output this shift:    Filed Vitals:   10/07/12 0658  BP: 139/75  Pulse: 72  Temp: 98.1 F (36.7 C)  Resp: 20   Gen: NAD, AAO Abd: soft, NTTP, obese CV: RRR, no murmur Chest: CTA-B, equal effort bilaterally GU: foley draining clear urine, right nephrostomy in place draining pink urine Back: right nephrostomy tubes in place, some edema on the skin around her dressing.  Lab Results:   Basename 10/07/12 0500 10/05/12 0512  WBC -- --  HGB 9.1* 10.2*  HCT 27.8* 31.5*  PLT -- --   BMET  Basename 10/07/12 0500 10/05/12 0512  NA 135 138  K 3.3* 3.7  CL 100 100  CO2 26 29  GLUCOSE 118* 118*  BUN 11 9  CREATININE 0.85 0.86  CALCIUM 8.9 9.2   PT/INR No results found for this basename: LABPROT:2,INR:2 in the last 72 hours ABG No results found for this basename: PHART:2,PCO2:2,PO2:2,HCO3:2 in the last 72 hours  Studies/Results: No results found.  Anti-infectives: Anti-infectives     Start     Dose/Rate Route Frequency Ordered Stop   10/05/12 1000   sulfamethoxazole-trimethoprim (BACTRIM DS) 800-160 MG per tablet 1 tablet        1 tablet Oral Every 12 hours 10/05/12  0633     10/04/12 0714   piperacillin-tazobactam (ZOSYN) IVPB 3.375 g  Status:  Discontinued        3.375 g 100 mL/hr over 30 Minutes Intravenous 30 min pre-op 10/04/12 0707 10/04/12 2258   10/02/12 2200  efavirenz-emtricitabine-tenofovir (ATRIPLA) 600-200-300 MG per tablet 1 tablet       1 tablet Oral Daily at bedtime 10/02/12 1413     10/01/12 1630   piperacillin-tazobactam (ZOSYN) IVPB 3.375 g        3.375 g 100 mL/hr over 30 Minutes Intravenous  Once 10/01/12 1615 10/02/12 0743          Assessment/Plan:  1 - 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 She is posted for 3rd stage tomorrow.  - Keep current drains - Maintain daily ambulation  -Replace potassium PO. -Morning labs: BMP, H&H -NPO tomorrow morning for afternoon surgery.   Molli Hazard 10/07/2012

## 2012-10-08 ENCOUNTER — Inpatient Hospital Stay (HOSPITAL_COMMUNITY): Payer: Medicaid Other

## 2012-10-08 ENCOUNTER — Encounter (HOSPITAL_COMMUNITY): Payer: Self-pay | Admitting: Anesthesiology

## 2012-10-08 ENCOUNTER — Encounter (HOSPITAL_COMMUNITY)
Admission: RE | Disposition: A | Discharge status: Home / Self Care | Payer: Self-pay | Source: Ambulatory Visit | Attending: Urology

## 2012-10-08 ENCOUNTER — Inpatient Hospital Stay (HOSPITAL_COMMUNITY): Payer: Medicaid Other | Admitting: Anesthesiology

## 2012-10-08 ENCOUNTER — Ambulatory Visit: Payer: Medicaid Other

## 2012-10-08 HISTORY — PX: NEPHROLITHOTOMY: SHX5134

## 2012-10-08 LAB — BASIC METABOLIC PANEL
BUN: 11 mg/dL (ref 6–23)
Chloride: 101 mEq/L (ref 96–112)
Creatinine, Ser: 0.91 mg/dL (ref 0.50–1.10)
GFR calc Af Amer: 88 mL/min — ABNORMAL LOW (ref >90)
GFR calc non Af Amer: 76 mL/min — ABNORMAL LOW (ref >90)
Glucose, Bld: 108 mg/dL — ABNORMAL HIGH (ref 70–99)

## 2012-10-08 LAB — HEMOGLOBIN AND HEMATOCRIT, BLOOD: Hemoglobin: 10.7 g/dL — ABNORMAL LOW (ref 12.0–15.0)

## 2012-10-08 SURGERY — NEPHROLITHOTOMY PERCUTANEOUS
Anesthesia: General | Site: Back | Laterality: Right | Wound class: Clean Contaminated

## 2012-10-08 MED ORDER — ONDANSETRON HCL 4 MG/2ML IJ SOLN
INTRAMUSCULAR | Status: DC | PRN
  Administered 2012-10-08: 4 mg via INTRAVENOUS

## 2012-10-08 MED ORDER — GLYCOPYRROLATE 0.2 MG/ML IJ SOLN
INTRAMUSCULAR | Status: DC | PRN
  Administered 2012-10-08: .8 mg via INTRAVENOUS

## 2012-10-08 MED ORDER — LACTATED RINGERS IV SOLN
INTRAVENOUS | Status: AC
  Administered 2012-10-08: 1000 mL via INTRAVENOUS

## 2012-10-08 MED ORDER — HYDROMORPHONE HCL PF 1 MG/ML IJ SOLN
INTRAMUSCULAR | Status: DC | PRN
  Administered 2012-10-08 (×2): 0.5 mg via INTRAVENOUS

## 2012-10-08 MED ORDER — SODIUM CHLORIDE 0.9 % IR SOLN
Status: DC | PRN
  Administered 2012-10-08: 1000 mL

## 2012-10-08 MED ORDER — LABETALOL HCL 5 MG/ML IV SOLN
INTRAVENOUS | Status: DC | PRN
  Administered 2012-10-08 (×2): 2.5 mg via INTRAVENOUS

## 2012-10-08 MED ORDER — FENTANYL CITRATE 0.05 MG/ML IJ SOLN
INTRAMUSCULAR | Status: DC | PRN
  Administered 2012-10-08 (×7): 50 ug via INTRAVENOUS

## 2012-10-08 MED ORDER — IOHEXOL 300 MG/ML  SOLN
INTRAMUSCULAR | Status: DC | PRN
  Administered 2012-10-08: 20 mL

## 2012-10-08 MED ORDER — LACTATED RINGERS IV SOLN: INTRAVENOUS | Status: DC

## 2012-10-08 MED ORDER — SODIUM CHLORIDE 0.9 % IR SOLN
Status: DC | PRN
  Administered 2012-10-08: 33000 mL

## 2012-10-08 MED ORDER — PIPERACILLIN-TAZOBACTAM 3.375 G IVPB 30 MIN
3.3750 g | Freq: Once | INTRAVENOUS | Status: AC
  Administered 2012-10-08: 3.375 g via INTRAVENOUS
  Filled 2012-10-08: qty 50

## 2012-10-08 MED ORDER — MIDAZOLAM HCL 5 MG/5ML IJ SOLN
INTRAMUSCULAR | Status: DC | PRN
  Administered 2012-10-08: 2 mg via INTRAVENOUS

## 2012-10-08 MED ORDER — NEOSTIGMINE METHYLSULFATE 1 MG/ML IJ SOLN
INTRAMUSCULAR | Status: DC | PRN
  Administered 2012-10-08: 5 mg via INTRAVENOUS

## 2012-10-08 MED ORDER — SUCCINYLCHOLINE CHLORIDE 20 MG/ML IJ SOLN
INTRAMUSCULAR | Status: DC | PRN
  Administered 2012-10-08: 100 mg via INTRAVENOUS

## 2012-10-08 MED ORDER — PROPOFOL 10 MG/ML IV BOLUS
INTRAVENOUS | Status: DC | PRN
  Administered 2012-10-08: 250 mg via INTRAVENOUS

## 2012-10-08 MED ORDER — ROCURONIUM BROMIDE 100 MG/10ML IV SOLN
INTRAVENOUS | Status: DC | PRN
  Administered 2012-10-08: 50 mg via INTRAVENOUS
  Administered 2012-10-08 (×3): 10 mg via INTRAVENOUS

## 2012-10-08 MED ORDER — HYDROMORPHONE HCL PF 1 MG/ML IJ SOLN
0.2500 mg | INTRAMUSCULAR | Status: DC | PRN
  Administered 2012-10-08 – 2012-10-10 (×5): 0.5 mg via INTRAVENOUS
  Filled 2012-10-08 (×2): qty 1

## 2012-10-08 SURGICAL SUPPLY — 50 items
BAG URINE DRAINAGE (UROLOGICAL SUPPLIES) ×2 IMPLANT
BASKET ZERO TIP NITINOL 2.4FR (BASKET) ×2 IMPLANT
BENZOIN TINCTURE PRP APPL 2/3 (GAUZE/BANDAGES/DRESSINGS) ×2 IMPLANT
BLADE SURG 15 STRL LF DISP TIS (BLADE) ×1 IMPLANT
BLADE SURG 15 STRL SS (BLADE) ×1
CATH FOLEY 2W COUNCIL 20FR 5CC (CATHETERS) IMPLANT
CATH MULTI PURPOSE 16FR DRAIN (STENTS) ×6 IMPLANT
CATH ROBINSON RED A/P 20FR (CATHETERS) IMPLANT
CATH X-FORCE N30 NEPHROSTOMY (TUBING) ×2 IMPLANT
CLOTH BEACON ORANGE TIMEOUT ST (SAFETY) ×2 IMPLANT
CONNECTOR 5 IN 1 STRAIGHT STRL (MISCELLANEOUS) ×2 IMPLANT
CONT SPEC 4OZ CLIKSEAL STRL BL (MISCELLANEOUS) ×2 IMPLANT
COVER SURGICAL LIGHT HANDLE (MISCELLANEOUS) IMPLANT
DRAPE C-ARM 42X72 X-RAY (DRAPES) ×2 IMPLANT
DRAPE CAMERA CLOSED 9X96 (DRAPES) ×2 IMPLANT
DRAPE LINGEMAN PERC (DRAPES) ×2 IMPLANT
DRAPE SURG IRRIG POUCH 19X23 (DRAPES) ×2 IMPLANT
DRSG PAD ABDOMINAL 8X10 ST (GAUZE/BANDAGES/DRESSINGS) ×4 IMPLANT
DRSG TEGADERM 8X12 (GAUZE/BANDAGES/DRESSINGS) ×2 IMPLANT
GLOVE BIOGEL M 8.0 STRL (GLOVE) ×8 IMPLANT
GOWN STRL REIN XL XLG (GOWN DISPOSABLE) ×4 IMPLANT
GUIDEWIRE AMPLAZ .035X145 (WIRE) ×4 IMPLANT
IV NS IRRIG 3000ML ARTHROMATIC (IV SOLUTION) ×22 IMPLANT
KIT BASIN OR (CUSTOM PROCEDURE TRAY) ×2 IMPLANT
LASER FIBER DISP (UROLOGICAL SUPPLIES) ×4 IMPLANT
LASER FIBER DISP 1000U (UROLOGICAL SUPPLIES) IMPLANT
MANIFOLD NEPTUNE II (INSTRUMENTS) ×2 IMPLANT
NS IRRIG 1000ML POUR BTL (IV SOLUTION) ×2 IMPLANT
PACK BASIC VI WITH GOWN DISP (CUSTOM PROCEDURE TRAY) ×2 IMPLANT
PAD ABD 7.5X8 STRL (GAUZE/BANDAGES/DRESSINGS) IMPLANT
PROBE LITHOCLAST ULTRA 3.8X403 (UROLOGICAL SUPPLIES) ×2 IMPLANT
PROBE PNEUMATIC 1.0MMX570MM (UROLOGICAL SUPPLIES) ×2 IMPLANT
SET IRRIG Y TYPE TUR BLADDER L (SET/KITS/TRAYS/PACK) ×2 IMPLANT
SET WARMING FLUID IRRIGATION (MISCELLANEOUS) ×2 IMPLANT
SHEATH PEELAWAY SET 9 (SHEATH) ×2 IMPLANT
SHEATH X FORCE 10MMX22CM (SHEATH) ×4 IMPLANT
SPONGE GAUZE 4X4 12PLY (GAUZE/BANDAGES/DRESSINGS) ×2 IMPLANT
SPONGE LAP 18X18 X RAY DECT (DISPOSABLE) ×2 IMPLANT
SPONGE LAP 4X18 X RAY DECT (DISPOSABLE) IMPLANT
STONE CATCHER W/TUBE ADAPTER (UROLOGICAL SUPPLIES) ×2 IMPLANT
SUT SILK 2 0 30  PSL (SUTURE) ×3
SUT SILK 2 0 30 PSL (SUTURE) ×3 IMPLANT
SYR 20CC LL (SYRINGE) ×4 IMPLANT
SYR CONTROL 10ML LL (SYRINGE) ×2 IMPLANT
SYRINGE 10CC LL (SYRINGE) ×2 IMPLANT
TRAY FOLEY BAG SILVER LF 14FR (CATHETERS) ×2 IMPLANT
TRAY FOLEY CATH 14FRSI W/METER (CATHETERS) IMPLANT
TUBE CONNECTING VINYL 14FR 30C (MISCELLANEOUS) ×2 IMPLANT
TUBING CONNECTING 10 (TUBING) ×6 IMPLANT
WATER STERILE IRR 1500ML POUR (IV SOLUTION) IMPLANT

## 2012-10-08 NOTE — Progress Notes (Signed)
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  No events overnight. Pt feeling stronger and ready for 3rd stage procedure today. She understands that she will likely require at least one more surgery to get stone free.  ROS:  Negative: SOB  Objective: Vital signs in last 24 hours: Temp:  [98.3 F (36.8 C)-99.4 F (37.4 C)] 98.5 F (36.9 C) (12/23 0500) Pulse Rate:  [77-81] 78  (12/23 0500) Resp:  [17-20] 20  (12/23 0500) BP: (135-155)/(77-86) 154/84 mmHg (12/23 0500) SpO2:  [98 %-100 %] 98 % (12/23 0500) Last BM Date: 10/01/12  Intake/Output from previous day: 12/22 0701 - 12/23 0700 In: 1590 [P.O.:1230; I.V.:360] Out: 2045 [Urine:2045] Intake/Output this shift:    General appearance: alert, cooperative and appears stated age Head: Normocephalic, without obvious abnormality, atraumatic Eyes: conjunctivae/corneas clear. PERRL, EOM's intact. Fundi benign. Ears: normal TM's and external ear canals both ears Nose: Nares normal. Septum midline. Mucosa normal. No drainage or sinus tenderness. Throat: lips, mucosa, and tongue normal; teeth and gums normal Neck: no adenopathy, no carotid bruit, no JVD, supple, symmetrical, trachea midline and thyroid not enlarged, symmetric, no tenderness/mass/nodules Back: symmetric, no curvature. ROM normal. No CVA tenderness. Resp: clear to auscultation bilaterally Cardio: regular rate and rhythm, S1, S2 normal, no murmur, click, rub or gallop GI: soft, non-tender; bowel sounds normal; no masses,  no organomegaly Extremities: extremities normal, atraumatic, no cyanosis or edema Pulses: 2+ and symmetric Skin: Skin color, texture, turgor normal. No rashes or lesions Lymph nodes: Cervical, supraclavicular, and axillary nodes normal. Neurologic:  Grossly normal Incision/Wound: Rt neph tubes c/d/i with lower pole to drain with pink urine. Foley with amber-clolored urine.   Lab Results:   Basename 10/08/12 0435 10/07/12 0500  WBC -- --  HGB 9.7* 9.1*  HCT 30.7* 27.8*  PLT -- --   BMET  Basename 10/08/12 0435 10/07/12 0500  NA 136 135  K 4.0 3.3*  CL 101 100  CO2 26 26  GLUCOSE 108* 118*  BUN 11 11  CREATININE 0.91 0.85  CALCIUM 8.8 8.9   PT/INR No results found for this basename: LABPROT:2,INR:2 in the last 72 hours ABG No results found for this basename: PHART:2,PCO2:2,PO2:2,HCO3:2 in the last 72 hours  Studies/Results: No results found.  Anti-infectives: Anti-infectives     Start     Dose/Rate Route Frequency Ordered Stop   10/05/12 1000  sulfamethoxazole-trimethoprim (BACTRIM DS) 800-160 MG per tablet 1 tablet       1 tablet Oral Every 12 hours 10/05/12 0633     10/04/12 0714   piperacillin-tazobactam (ZOSYN) IVPB 3.375 g  Status:  Discontinued        3.375 g 100 mL/hr over 30 Minutes Intravenous 30 min pre-op 10/04/12 0707 10/04/12 2258   10/02/12 2200  efavirenz-emtricitabine-tenofovir (ATRIPLA) 600-200-300 MG per tablet 1 tablet       1 tablet Oral Daily at bedtime 10/02/12 1413     10/01/12 1630   piperacillin-tazobactam (ZOSYN) IVPB 3.375 g        3.375 g 100 mL/hr over 30 Minutes Intravenous  Once 10/01/12 1615 10/02/12 0743          Assessment/Plan:  1 - 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 She is posted for 3rd stage today.  - Keep current drains - Maintain daily ambulation  -Replace potassium PO. -NPO for surgery today   LOS: 6 days    [facility], Gram Siedlecki 10/08/2012

## 2012-10-08 NOTE — Anesthesia Postprocedure Evaluation (Signed)
  Anesthesia Post-op Note  Patient: [name]  Procedure(s) Performed: Procedure(s) (LRB): NEPHROLITHOTOMY PERCUTANEOUS (Right)  Patient Location: PACU  Anesthesia Type: General  Level of Consciousness: awake and alert   Airway and Oxygen Therapy: Patient Spontanous Breathing  Post-op Pain: mild  Post-op Assessment: Post-op Vital signs reviewed, Patient's Cardiovascular Status Stable, Respiratory Function Stable, Patent Airway and No signs of Nausea or vomiting  Last Vitals:  Filed Vitals:   10/08/12 2023  BP:   Pulse: 77  Temp: 37.3 C  Resp: 20    Post-op Vital Signs: stable   Complications: No apparent anesthesia complications

## 2012-10-08 NOTE — Brief Op Note (Signed)
10/02/2012 - 10/08/2012  8:06 PM  PATIENT:  [name]  46 y.o. female  PRE-OPERATIVE DIAGNOSIS:  RIGHT STAGHORN KIDNEY STONE  POST-OPERATIVE DIAGNOSIS:  Right Staghorn Kidney Stone  PROCEDURE:  Procedure(s) (LRB) with comments: NEPHROLITHOTOMY PERCUTANEOUS (Right) - THIRD STAGE, nephrostomy tube exchange x 2  SURGEON:  Surgeon(s) and Role:    * Alexis Frock, MD - Primary  PHYSICIAN ASSISTANT:   ASSISTANTS: none   ANESTHESIA:   general  EBL:     BLOOD ADMINISTERED:none  DRAINS: Urinary Catheter (Foley) and Rt nephroureteral stent x3, capped. Rt superior nephrostomy, capped. Rt inferior nephrsotomy to drain,    LOCAL MEDICATIONS USED:  NONE  SPECIMEN:  Source of Specimen:  Rt renal stones  DISPOSITION OF SPECIMEN:  Given to patient  COUNTS:  YES  TOURNIQUET:  * No tourniquets in log *  DICTATION: .Other Dictation: Dictation Number [id number]  PLAN OF CARE: Admit to inpatient   PATIENT DISPOSITION:  PACU - hemodynamically stable.   Delay start of Pharmacological VTE agent (>24hrs) due to surgical blood loss or risk of bleeding: yes

## 2012-10-08 NOTE — Anesthesia Preprocedure Evaluation (Addendum)
Anesthesia Evaluation  Patient identified by MRN, date of birth, ID band Patient awake    Reviewed: Allergy & Precautions, H&P , NPO status , Patient's Chart, lab work & pertinent test results  Airway Mallampati: III TM Distance: >3 FB Neck ROM: full    Dental No notable dental hx. (+) Teeth Intact and Dental Advisory Given   Pulmonary asthma , Current Smoker,  Sarcoidosis 4/08 breath sounds clear to auscultation  Pulmonary exam normal       Cardiovascular Exercise Tolerance: Good hypertension, On Medications Rhythm:regular Rate:Normal  Prolonged QT   Neuro/Psych negative neurological ROS  negative psych ROS   GI/Hepatic negative GI ROS, Neg liver ROS,   Endo/Other  Hypothyroidism Hyperthyroidism Morbid obesityGraves disease  Renal/GU negative Renal ROS  negative genitourinary   Musculoskeletal   Abdominal   Peds  Hematology negative hematology ROS (+) Blood dyscrasia, anemia , HIV, Hgb. 9.7   Anesthesia Other Findings   Reproductive/Obstetrics negative OB ROS                           Anesthesia Physical Anesthesia Plan  ASA: III  Anesthesia Plan: General   Post-op Pain Management:    Induction: Intravenous  Airway Management Planned: Oral ETT  Additional Equipment:   Intra-op Plan:   Post-operative Plan: Extubation in OR  Informed Consent: I have reviewed the patients History and Physical, chart, labs and discussed the procedure including the risks, benefits and alternatives for the proposed anesthesia with the patient or authorized representative who has indicated his/her understanding and acceptance.   Dental Advisory Given  Plan Discussed with: CRNA and Surgeon  Anesthesia Plan Comments:         Anesthesia Quick Evaluation

## 2012-10-08 NOTE — Transfer of Care (Signed)
Immediate Anesthesia Transfer of Care Note  Patient: [name]  Procedure(s) Performed: Procedure(s) (LRB) with comments: NEPHROLITHOTOMY PERCUTANEOUS (Right) - THIRD STAGE, nephrostomy tube exchange x 2  Patient Location: PACU  Anesthesia Type:General  Level of Consciousness: awake, alert , oriented and patient cooperative  Airway & Oxygen Therapy: Patient Spontanous Breathing and Patient connected to face mask oxygen  Post-op Assessment: Report given to PACU RN, Post -op Vital signs reviewed and stable and Patient moving all extremities  Post vital signs: Reviewed and stable  Complications: No apparent anesthesia complications

## 2012-10-08 NOTE — Preoperative (Signed)
Beta Blockers   Reason not to administer Beta Blockers:Not Applicable 

## 2012-10-09 ENCOUNTER — Encounter (HOSPITAL_COMMUNITY): Payer: Self-pay | Admitting: Urology

## 2012-10-09 ENCOUNTER — Other Ambulatory Visit: Payer: Self-pay | Admitting: Urology

## 2012-10-09 LAB — BASIC METABOLIC PANEL
CO2: 24 mEq/L (ref 19–32)
Calcium: 8.9 mg/dL (ref 8.4–10.5)
Creatinine, Ser: 1.08 mg/dL (ref 0.50–1.10)

## 2012-10-09 LAB — HEMOGLOBIN AND HEMATOCRIT, BLOOD
HCT: 28.7 % — ABNORMAL LOW (ref 36.0–46.0)
Hemoglobin: 9.2 g/dL — ABNORMAL LOW (ref 12.0–15.0)

## 2012-10-09 MED ORDER — NYSTATIN 100000 UNIT/ML MT SUSP
5.0000 mL | Freq: Four times a day (QID) | OROMUCOSAL | Status: DC
  Administered 2012-10-09 – 2012-10-11 (×10): 500000 [IU] via ORAL
  Filled 2012-10-09 (×12): qty 5

## 2012-10-09 MED ORDER — ACETAMINOPHEN 325 MG PO TABS
ORAL_TABLET | ORAL | Status: AC
  Filled 2012-10-09: qty 2

## 2012-10-09 MED ORDER — ACETAMINOPHEN 325 MG PO TABS
650.0000 mg | ORAL_TABLET | Freq: Four times a day (QID) | ORAL | Status: DC | PRN
  Administered 2012-10-09 – 2012-10-11 (×3): 650 mg via ORAL
  Filled 2012-10-09 (×3): qty 2

## 2012-10-09 MED ORDER — PIPERACILLIN-TAZOBACTAM 3.375 G IVPB
3.3750 g | Freq: Three times a day (TID) | INTRAVENOUS | Status: DC
  Administered 2012-10-09 – 2012-10-11 (×7): 3.375 g via INTRAVENOUS
  Filled 2012-10-09 (×7): qty 50

## 2012-10-09 NOTE — Progress Notes (Signed)
Patient's neprhostomy dressing is leaking a lot from post-op. This is the second time it has soaked patient's gown and bedding. RN tried to reinforce dressing twice.MD Eskridge gave RN go ahead to change dressing but to be careful not to pull on the nephrostomy tube.

## 2012-10-09 NOTE — Progress Notes (Signed)
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  One fever spike overnight. Denies productive cough. Complains of some mouth burning.   Objective: Vital signs in last 24 hours: Temp:  [98.3 F (36.8 C)-101.4 F (38.6 C)] 100.3 F (37.9 C) (12/24 0451) Pulse Rate:  [76-92] 92  (12/24 0451) Resp:  [18-23] 18  (12/24 0451) BP: (129-156)/(65-78) 136/66 mmHg (12/24 0451) SpO2:  [91 %-100 %] 94 % (12/24 0456) Last BM Date: 10/07/12  Intake/Output from previous day: 12/23 0701 - 12/24 0700 In: 2828.3 [P.O.:420; I.V.:2358.3; IV Piggyback:50] Out: 1890 [id number]; Blood:100] Intake/Output this shift: Total I/O In: -  Out: 275 [Urine:275]  General appearance: alert, cooperative and appears stated age Head: Normocephalic, without obvious abnormality, atraumatic Eyes: conjunctivae/corneas clear. PERRL, EOM's intact. Fundi benign. Ears: normal TM's and external ear canals both ears Nose: Nares normal. Septum midline. Mucosa normal. No drainage or sinus tenderness. Throat: Mild oral thrush Back: symmetric, no curvature. ROM normal. No CVA tenderness. Resp: clear to auscultation bilaterally Chest wall: no tenderness Cardio: regular rate and rhythm, S1, S2 normal, no murmur, click, rub or gallop GI: soft, non-tender; bowel sounds normal; no masses,  no organomegaly Extremities: extremities normal, atraumatic, no cyanosis or edema Pulses: 2+ and symmetric Skin: Skin color, texture, turgor normal. No rashes or lesions Lymph nodes: Cervical, supraclavicular, and axillary nodes normal. Neurologic: Grossly normal Incision/Wound: Rt neph tubes c/d/i with pink urine. Foley c/d/i with dark yellow urine.  Lab Results:   Basename 10/09/12 0453  10/08/12 2043  WBC -- --  HGB 9.2* 10.7*  HCT 28.7* 33.6*  PLT -- --   BMET  Basename 10/09/12 0453 10/08/12 0435  NA 132* 136  K 4.1 4.0  CL 99 101  CO2 24 26  GLUCOSE 131* 108*  BUN 11 11  CREATININE 1.08 0.91  CALCIUM 8.9 8.8   PT/INR No results found for this basename: LABPROT:2,INR:2 in the last 72 hours ABG No results found for this basename: PHART:2,PCO2:2,PO2:2,HCO3:2 in the last 72 hours  Studies/Results: Dg C-arm Gt 120 Min-no Report  10/08/2012  CLINICAL DATA: surgery   C-ARM GT 120 MINUTE  Fluoroscopy was utilized by the requesting physician.  No radiographic  interpretation.      Anti-infectives: Anti-infectives     Start     Dose/Rate Route Frequency Ordered Stop   10/09/12 0400   piperacillin-tazobactam (ZOSYN) IVPB 3.375 g        3.375 g 12.5 mL/hr over 240 Minutes Intravenous Every 8 hours 10/09/12 0341     10/08/12 1530   piperacillin-tazobactam (ZOSYN) IVPB 3.375 g        3.375 g 100 mL/hr over 30 Minutes Intravenous  Once 10/08/12 1512 10/08/12 1600   10/05/12 1000  sulfamethoxazole-trimethoprim (BACTRIM DS) 800-160 MG per tablet 1 tablet       1 tablet Oral Every 12 hours 10/05/12 0633     10/04/12 0714   piperacillin-tazobactam (ZOSYN) IVPB 3.375 g  Status:  Discontinued        3.375 g 100 mL/hr over 30 Minutes Intravenous 30 min pre-op 10/04/12 0707 10/04/12 2258   10/02/12 2200  efavirenz-emtricitabine-tenofovir (ATRIPLA) 600-200-300 MG per tablet 1 tablet       1 tablet Oral Daily at bedtime 10/02/12  1413     10/01/12 1630   piperacillin-tazobactam (ZOSYN) IVPB 3.375 g        3.375 g 100 mL/hr over 30 Minutes Intravenous  Once 10/01/12 1615 10/02/12 0743          Assessment/Plan:  1 - 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 She is tentatively posted for 4th stage procedure on Thursday 12/26.  - Keep current drains  - Maintain  daily ambulation   -UCX and change to scheduled Zosyn for fever, will consider other etiologies if localizing symtpoms develop  - Nystatin swish + swallow for thrush  Malikai Gut 10/09/2012

## 2012-10-09 NOTE — Op Note (Signed)
[name], [name]                ACCOUNT NO.:  [id number]  MEDICAL RECORD NO.:  [id number]  LOCATION:  31                         FACILITY:  [facility]  PHYSICIAN:  Alexis Frock, MD     DATE OF BIRTH:  [birth year]  DATE OF PROCEDURE:10/08/2012 DATE OF DISCHARGE:                              OPERATIVE REPORT   DIAGNOSIS:  Residual right staghorn kidney stone.  PROCEDURES: 1. Third stage right percutaneous nephrostolithotomy stone greater     than 2 cm. 2. Dilation of right mid percutaneous tract. 3. Exchange of right nephrostomy tube x2. 4. Antegrade nephrostogram with interpretation.  FINDINGS: 1. Large volume mid pole and renal pelvis stone. 2. Large volume superior stone in anterior calyx, all accessible via     lower pole access and flexible endoscopy. 3. No extravasation of contrast following all maneuvers and     replacement of tubes.  ESTIMATED BLOOD LOSS:  100 mL.  SPECIMEN:  Renal stone given to family.  INDICATION:  [name] is a pleasant 46 year old lady with complete right staghorn stone, but with preserve function greater than 30%.  She is currently in the midst of a multistage approach treatment of this in effort to get her stone free.  She underwent first and second stage procedure last week and she now presents for the third stage procedure today.  Notably, she had upper pole, mid pole and lower pole access previously obtained with upper and lower dilated.  She now presents for focus on her mid pole and renal pelvis stones.  Informed consent was obtained and placed in the medical record.  PROCEDURE IN DETAIL:  The patient being [name], was verified. Procedure being right third stage percutaneous nephrostolithotomy was confirmed.  Procedure was carried out.  Time-out was performed. Intravenous antibiotics were administered.  General LMA anesthesia was introduced.  The patient was placed into a prone position employing chest rolls, stable flexion and  sequential pressure devices, padding as needs, and axillary rolls.  Sterile field was created by prepping and draping of the patient's previously applied superior and anterior nephrostomy tubes and all nephroureteral stents into the operative field of the right flank.  Initial attention was directed at dilation of right mid pole tract.  The right mid nephroureteral stent was exchanged for a superstiff wire to the level of urinary bladder.  The 30-French balloon dilation access device was carefully positioned and inflated to a pressure of 16 atmospheres, hold for 90 seconds, and the sheath was advanced into position.  Rigid nephroscopy revealed excellent placement of the sheath and direct apposition to mid-pole stone. LithoClast dual energy ultrasound and pneumatic device was applied to the stone for approximately 2 hours, thus completely fragmenting the mid- pole and renal pelvis stone, all accessible areas of the mid-pole.  Two rigid technique had been managed.  Decision was then made to re-dilate the upper pole to once again see if any other areas amenable to rigid technique.  The right upper pole nephrostomy tube was removed.  The right nephroureteral stent was exchanged for a superstiff wire.  The balloon dilation access device was placed in proper position and inflated to a pressure of 16 atmospheres and exchanged  sheath appropriately advanced.  Rigid nephroscopy revealed excellent placement of the superior pole sheath.  There was some scattered stones that were grasped with rigid graspers and removed.  It was placed in the separate area in the posterior calyx, it was rather large and conglomerate, and LithoClast energy was applied to this.  There was a persistent shadow consistent with possible anterior acute angled stone that could not be seen with rigid nephroscopy in the upper pole.  Next, lower pole access was once again re-dilated in a similar fashion.  The 30-French  balloon had been exchanged and nephroureteral stent for superstiff wire.  Rigid nephroscopy of the lower pole access revealed similarly some residual stone, which was grasped and brought out in its entirety.  Next, flexible endoscopy was performed via the lower pole tract and indeed a large separate foci have shown and the upper pole was seen, likely in the anterior calyx had acute angle from the upper pole track.  Holmium laser energy was applied to the stone using dusting technique using a 365 cm fiber at 0.6 joules and 6 Hz for an additional hour.  This fragment approximately 50% of this particular stone.  This was felt to be the predominant residual area going forward.  At this point, the patient had been in prone approximately 3-1/2 hours until we conclude the procedure today, and as such, the nephroureteral stents were sequentially re-placed over the superstiff wires in the upper pole and mid pole and lower pole using continuous fluoroscopy at the level of the urinary bladder.  The upper pole and lower pole nephrostomy tubes were carefully placed, being 16-French and coiled to represent the dilated calices.  The superior one was capped the lower connected to straight drain.  All nephroureteral stents were capped.  All tubes were sewn in place using interrupted silk.  Dry dressing was applied followed by Tegaderm.  Final antegrade nephrostogram revealed no extravasation of contrast following of these maneuvers and excellent ureteral patency. Procedure was then terminated.  The patient tolerated the procedure well.  There were no immediate periprocedural complications.  The patient was taken to the postanesthesia care unit in stable condition.          ______________________________ Alexis Frock, MD     TM/MEDQ  D:  10/08/2012  T:  10/09/2012  Job:  [id number]

## 2012-10-10 LAB — URINE CULTURE: Culture: NO GROWTH

## 2012-10-10 LAB — GRAM STAIN

## 2012-10-10 NOTE — Progress Notes (Signed)
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  One fever spike Rx Zosyn yesterday. Feels well today. For 4th PCL Thursday.  Urine c/s in progress.   Subjective:     No acute urologic events overnight. Ambulation:   positive Flatus:    positive Bowel movement  positive  Pain: some relief  Objective:  Blood pressure 135/77, pulse 75, temperature 98.2 F (36.8 C), temperature source Oral, resp. rate 20, height 5\' 5"  (1.651 m), weight 115.7 kg (255 lb 1.2 oz), SpO2 100.00%.  Physical Exam:  General:  No acute distress, awake Resp: clear to auscultation bilaterally Genitourinary:  Normal BUS Foley:yes    I/O last 3 completed shifts: In: 5478.3 [P.O.:3200; I.V.:2078.3; IV Piggyback:200] Out: [id number] [Urine:4865; Blood:100]  Recent Labs  [facility] 10/09/12 0453 10/08/12 2043   HGB 9.2* 10.7*   WBC -- --   PLT -- --    Recent Labs  Basename 10/09/12 0453 10/08/12 0435   NA 132* 136   K 4.1 4.0   CL 99 101   CO2 24 26   BUN 11 11   CREATININE 1.08 0.91   CALCIUM 8.9 8.8   GFRNONAA 61* 76*   GFRAA 71* 88*     No results found for this basename: PT:2,INR:2,APTT:2 in the last 72 hours   No components found with this basename: ABG:2  Assessment/Plan:  Catheter not removed.

## 2012-10-11 ENCOUNTER — Encounter (HOSPITAL_COMMUNITY)
Admission: RE | Disposition: A | Discharge status: Home / Self Care | Payer: Self-pay | Source: Ambulatory Visit | Attending: Urology

## 2012-10-11 ENCOUNTER — Inpatient Hospital Stay (HOSPITAL_COMMUNITY): Payer: Medicaid Other

## 2012-10-11 ENCOUNTER — Encounter (HOSPITAL_COMMUNITY): Payer: Self-pay | Admitting: Anesthesiology

## 2012-10-11 ENCOUNTER — Inpatient Hospital Stay (HOSPITAL_COMMUNITY): Payer: Medicaid Other | Admitting: Anesthesiology

## 2012-10-11 HISTORY — PX: NEPHROLITHOTOMY: SHX5134

## 2012-10-11 SURGERY — NEPHROLITHOTOMY PERCUTANEOUS SECOND LOOK
Anesthesia: General | Site: Ureter | Laterality: Right | Wound class: Clean Contaminated

## 2012-10-11 MED ORDER — IOHEXOL 300 MG/ML  SOLN
INTRAMUSCULAR | Status: DC | PRN
  Administered 2012-10-11: 20 mL

## 2012-10-11 MED ORDER — NEOSTIGMINE METHYLSULFATE 1 MG/ML IJ SOLN
INTRAMUSCULAR | Status: DC | PRN
  Administered 2012-10-11: 4 mg via INTRAVENOUS

## 2012-10-11 MED ORDER — GLYCOPYRROLATE 0.2 MG/ML IJ SOLN
INTRAMUSCULAR | Status: DC | PRN
  Administered 2012-10-11: .6 mg via INTRAVENOUS

## 2012-10-11 MED ORDER — PIPERACILLIN-TAZOBACTAM 3.375 G IVPB
3.3750 g | INTRAVENOUS | Status: DC
  Filled 2012-10-11: qty 50

## 2012-10-11 MED ORDER — PROMETHAZINE HCL 25 MG/ML IJ SOLN
6.2500 mg | INTRAMUSCULAR | Status: DC | PRN
  Administered 2012-10-11: 6.25 mg via INTRAVENOUS
  Filled 2012-10-11: qty 1

## 2012-10-11 MED ORDER — ROCURONIUM BROMIDE 100 MG/10ML IV SOLN
INTRAVENOUS | Status: DC | PRN
  Administered 2012-10-11: 50 mg via INTRAVENOUS
  Administered 2012-10-11: 10 mg via INTRAVENOUS

## 2012-10-11 MED ORDER — ONDANSETRON HCL 4 MG/2ML IJ SOLN
INTRAMUSCULAR | Status: DC | PRN
  Administered 2012-10-11: 4 mg via INTRAVENOUS

## 2012-10-11 MED ORDER — DEXAMETHASONE SODIUM PHOSPHATE 10 MG/ML IJ SOLN
INTRAMUSCULAR | Status: DC | PRN
  Administered 2012-10-11: 10 mg via INTRAVENOUS

## 2012-10-11 MED ORDER — LACTATED RINGERS IV SOLN
INTRAVENOUS | Status: DC | PRN
  Administered 2012-10-11: 16:00:00 via INTRAVENOUS

## 2012-10-11 MED ORDER — SUCCINYLCHOLINE CHLORIDE 20 MG/ML IJ SOLN
INTRAMUSCULAR | Status: DC | PRN
  Administered 2012-10-11: 60 mg via INTRAVENOUS
  Administered 2012-10-11: 140 mg via INTRAVENOUS

## 2012-10-11 MED ORDER — SODIUM CHLORIDE 0.9 % IR SOLN
Status: DC | PRN
  Administered 2012-10-11: 3000 mL
  Administered 2012-10-11: 1000 mL

## 2012-10-11 MED ORDER — LIDOCAINE HCL (CARDIAC) 20 MG/ML IV SOLN
INTRAVENOUS | Status: DC | PRN
  Administered 2012-10-11: 80 mg via INTRAVENOUS

## 2012-10-11 MED ORDER — TISSEEL VH 10 ML EX KIT
PACK | CUTANEOUS | Status: DC | PRN
  Administered 2012-10-11: 10 mL

## 2012-10-11 MED ORDER — HYDROMORPHONE HCL PF 1 MG/ML IJ SOLN: 0.2500 mg | INTRAMUSCULAR | Status: DC | PRN

## 2012-10-11 MED ORDER — MIDAZOLAM HCL 5 MG/5ML IJ SOLN
INTRAMUSCULAR | Status: DC | PRN
  Administered 2012-10-11: 1 mg via INTRAVENOUS

## 2012-10-11 MED ORDER — PIPERACILLIN-TAZOBACTAM 3.375 G IVPB 30 MIN
INTRAVENOUS | Status: DC | PRN
  Administered 2012-10-11: 3.375 g via INTRAVENOUS

## 2012-10-11 MED ORDER — FENTANYL CITRATE 0.05 MG/ML IJ SOLN
INTRAMUSCULAR | Status: DC | PRN
  Administered 2012-10-11 (×5): 50 ug via INTRAVENOUS

## 2012-10-11 MED ORDER — LACTATED RINGERS IV SOLN
INTRAVENOUS | Status: DC | PRN
  Administered 2012-10-11: 18:00:00 via INTRAVENOUS

## 2012-10-11 MED ORDER — PROPOFOL 10 MG/ML IV BOLUS
INTRAVENOUS | Status: DC | PRN
  Administered 2012-10-11: 60 mg via INTRAVENOUS
  Administered 2012-10-11: 200 mg via INTRAVENOUS

## 2012-10-11 SURGICAL SUPPLY — 46 items
BAG URINE DRAINAGE (UROLOGICAL SUPPLIES) ×3 IMPLANT
BASKET ZERO TIP NITINOL 2.4FR (BASKET) ×3 IMPLANT
BENZOIN TINCTURE PRP APPL 2/3 (GAUZE/BANDAGES/DRESSINGS) ×9 IMPLANT
BLADE SURG 15 STRL LF DISP TIS (BLADE) ×2 IMPLANT
BLADE SURG 15 STRL SS (BLADE) ×1
CATH FOLEY 2W COUNCIL 20FR 5CC (CATHETERS) IMPLANT
CATH ROBINSON RED A/P 20FR (CATHETERS) IMPLANT
CATH X-FORCE N30 NEPHROSTOMY (TUBING) IMPLANT
CLOTH BEACON ORANGE TIMEOUT ST (SAFETY) ×3 IMPLANT
COVER SURGICAL LIGHT HANDLE (MISCELLANEOUS) ×3 IMPLANT
DRAPE C-ARM 42X72 X-RAY (DRAPES) ×3 IMPLANT
DRAPE CAMERA CLOSED 9X96 (DRAPES) ×3 IMPLANT
DRAPE LINGEMAN PERC (DRAPES) ×3 IMPLANT
DRAPE SURG IRRIG POUCH 19X23 (DRAPES) ×3 IMPLANT
DRSG TEGADERM 8X12 (GAUZE/BANDAGES/DRESSINGS) ×3 IMPLANT
DUPLOJECT EASY PREP 4ML (MISCELLANEOUS) ×3 IMPLANT
GLOVE SURG SS PI 8.0 STRL IVOR (GLOVE) ×3 IMPLANT
GOWN STRL REIN XL XLG (GOWN DISPOSABLE) ×3 IMPLANT
GUIDEWIRE AMPLAZ .035X145 (WIRE) ×3 IMPLANT
GUIDEWIRE ANG ZIPWIRE 038X150 (WIRE) ×3 IMPLANT
GUIDEWIRE STR DUAL SENSOR (WIRE) ×3 IMPLANT
KIT BASIN OR (CUSTOM PROCEDURE TRAY) ×3 IMPLANT
LASER FIBER DISP (UROLOGICAL SUPPLIES) ×3 IMPLANT
LASER FIBER DISP 1000U (UROLOGICAL SUPPLIES) IMPLANT
MANIFOLD NEPTUNE II (INSTRUMENTS) ×3 IMPLANT
NS IRRIG 1000ML POUR BTL (IV SOLUTION) ×3 IMPLANT
PACK BASIC VI WITH GOWN DISP (CUSTOM PROCEDURE TRAY) ×3 IMPLANT
PAD ABD 7.5X8 STRL (GAUZE/BANDAGES/DRESSINGS) IMPLANT
PROBE LITHOCLAST ULTRA 3.8X403 (UROLOGICAL SUPPLIES) IMPLANT
PROBE PNEUMATIC 1.0MMX570MM (UROLOGICAL SUPPLIES) IMPLANT
SET IRRIG Y TYPE TUR BLADDER L (SET/KITS/TRAYS/PACK) ×6 IMPLANT
SET WARMING FLUID IRRIGATION (MISCELLANEOUS) IMPLANT
SHEATH ACCESS URETERAL 24CM (SHEATH) ×3 IMPLANT
SPONGE GAUZE 4X4 12PLY (GAUZE/BANDAGES/DRESSINGS) ×3 IMPLANT
SPONGE LAP 4X18 X RAY DECT (DISPOSABLE) ×3 IMPLANT
STENT CONTOUR 6FRX26X.038 (STENTS) ×3 IMPLANT
STONE CATCHER W/TUBE ADAPTER (UROLOGICAL SUPPLIES) ×3 IMPLANT
SUT SILK 2 0 30  PSL (SUTURE) ×1
SUT SILK 2 0 30 PSL (SUTURE) ×2 IMPLANT
SUT VIC AB 2-0 SH 27 (SUTURE) ×1
SUT VIC AB 2-0 SH 27X BRD (SUTURE) ×2 IMPLANT
SYR 20CC LL (SYRINGE) ×6 IMPLANT
SYRINGE 10CC LL (SYRINGE) ×3 IMPLANT
TOWEL OR NON WOVEN STRL DISP B (DISPOSABLE) ×3 IMPLANT
TRAY FOLEY CATH 14FRSI W/METER (CATHETERS) IMPLANT
TUBING CONNECTING 10 (TUBING) ×6 IMPLANT

## 2012-10-11 NOTE — Preoperative (Signed)
Beta Blockers   Reason not to administer Beta Blockers:Not Applicable 

## 2012-10-11 NOTE — Progress Notes (Signed)
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  One fever spike few days ago, none since. Most recent UCX from foley and neph tube negative.  Objective: Vital signs in last 24 hours: Temp:  [98 F (36.7 C)-98.9 F (37.2 C)] 98.4 F (36.9 C) (12/26 0518) Pulse Rate:  [72-87] 80  (12/26 0518) Resp:  [16-18] 18  (12/26 0518) BP: (121-144)/(66-76) 133/66 mmHg (12/26 0518) SpO2:  [96 %-99 %] 98 % (12/26 0518) Weight:  [110.7 kg (244 lb 0.8 oz)] 110.7 kg (244 lb 0.8 oz) (12/26 0518) Last BM Date: 10/07/12  Intake/Output from previous day: 12/25 0701 - 12/26 0700 In: 36 [P.O.:360; I.V.:360; IV Piggyback:50] Out: 2325 [Urine:2325] Intake/Output this shift:    General appearance: alert, cooperative and appears stated age Head: Normocephalic, without obvious abnormality, atraumatic Eyes: conjunctivae/corneas clear. PERRL, EOM's intact. Fundi benign. Ears: normal TM's and external ear canals both ears Nose: Nares normal. Septum midline. Mucosa normal. No drainage or sinus tenderness. Throat: lips, mucosa, and tongue normal; teeth and gums normal and improved thrush Neck: no adenopathy, no carotid bruit, no JVD, supple, symmetrical, trachea midline and thyroid not enlarged, symmetric, no tenderness/mass/nodules Back: symmetric, no curvature. ROM normal. No CVA tenderness. Resp: clear to auscultation bilaterally Chest wall: no tenderness Cardio: regular rate and rhythm, S1, S2 normal, no murmur, click, rub or gallop GI: soft, non-tender; bowel sounds normal; no masses,  no organomegaly Extremities: extremities normal, atraumatic, no cyanosis or edema Pulses: 2+ and symmetric Skin: Skin color, texture, turgor normal. No rashes or  lesions Lymph nodes: Cervical, supraclavicular, and axillary nodes normal. Neurologic: Grossly normal Incision/Wound: neph tubes and foley with very light pink urine.  Lab Results:   Basename 10/09/12 0453 10/08/12 2043  WBC -- --  HGB 9.2* 10.7*  HCT 28.7* 33.6*  PLT -- --   BMET  Basename 10/09/12 0453  NA 132*  K 4.1  CL 99  CO2 24  GLUCOSE 131*  BUN 11  CREATININE 1.08  CALCIUM 8.9   PT/INR No results found for this basename: LABPROT:2,INR:2 in the last 72 hours ABG No results found for this basename: PHART:2,PCO2:2,PO2:2,HCO3:2 in the last 72 hours  Studies/Results: No results found.  Anti-infectives: Anti-infectives     Start     Dose/Rate Route Frequency Ordered Stop   10/09/12 0400   piperacillin-tazobactam (ZOSYN) IVPB 3.375 g  Status:  Discontinued        3.375 g 12.5 mL/hr over 240 Minutes Intravenous Every 8 hours 10/09/12 0341 10/11/12 0603   10/08/12 1530   piperacillin-tazobactam (ZOSYN) IVPB 3.375 g        3.375 g 100 mL/hr over 30 Minutes Intravenous  Once 10/08/12 1512 10/08/12 1600   10/05/12 1000  sulfamethoxazole-trimethoprim (BACTRIM DS) 800-160 MG per tablet 1 tablet       1 tablet Oral Every 12 hours 10/05/12 0633     10/04/12 0714   piperacillin-tazobactam (ZOSYN) IVPB 3.375 g  Status:  Discontinued        3.375 g 100 mL/hr over 30 Minutes Intravenous 30 min pre-op 10/04/12 0707 10/04/12 2258   10/02/12 2200  efavirenz-emtricitabine-tenofovir (ATRIPLA) 600-200-300 MG per tablet 1 tablet       1 tablet Oral Daily at bedtime 10/02/12  1413     10/01/12 1630   piperacillin-tazobactam (ZOSYN) IVPB 3.375 g        3.375 g 100 mL/hr over 30 Minutes Intravenous  Once 10/01/12 1615 10/02/12 0743          Assessment/Plan:  1 - 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 She is tentatively posted for 4th stage procedure today. NPO after 9am,  may have light breakfast.  - Keep current drains  - Maintain daily ambulation   -Stop scheduled Zosyn as UCX negative, continue Bactrim proph   [name] 10/11/2012

## 2012-10-11 NOTE — Transfer of Care (Signed)
Immediate Anesthesia Transfer of Care Note  Patient: [name]  Procedure(s) Performed: Procedure(s) (LRB) with comments: NEPHROLITHOTOMY PERCUTANEOUS SECOND LOOK (Right) - RIGHT 4 STAGE PERCUTANOUS NEPHROLITHOTOMY, right URETEROSCOPY WITH HOLMIUM LASER  URETERAL CATHETER OR STENT PLACEMENT (Right)  Patient Location: PACU  Anesthesia Type:General  Level of Consciousness: awake, alert , oriented and patient cooperative  Airway & Oxygen Therapy: Patient Spontanous Breathing and Patient connected to face mask oxygen  Post-op Assessment: Report given to PACU RN, Post -op Vital signs reviewed and stable and Patient moving all extremities  Post vital signs: Reviewed and stable  Complications: No apparent anesthesia complications

## 2012-10-11 NOTE — Brief Op Note (Signed)
10/02/2012 - 10/11/2012  8:19 PM  PATIENT:  [name]  46 y.o. female  PRE-OPERATIVE DIAGNOSIS:  right staghorn kidney stone and left renal stone   POST-OPERATIVE DIAGNOSIS:  right staghorn kidney stone and left renal stone  PROCEDURE:  Rt 4th look PCNL, Rt ureteroscopy with laser lithotripsy, Rt anterograde nephrostrogram, Rt ureteral stent placement  SURGEON:  Surgeon(s) and Role:    * Alexis Frock, MD - Primary  PHYSICIAN ASSISTANT:   ASSISTANTS: none   ANESTHESIA:   general  EBL:  Total I/O In: 1000 [I.V.:1000] Out: 1150 [Urine:1100; Blood:50]  BLOOD ADMINISTERED:none  DRAINS: none   LOCAL MEDICATIONS USED:  NONE  SPECIMEN:  Source of Specimen:  Rt renal stone  DISPOSITION OF SPECIMEN:  given to pt  COUNTS:  YES  TOURNIQUET:  * No tourniquets in log *  DICTATION: .Other Dictation: Dictation Number [id number]  PLAN OF CARE: Admit to inpatient   PATIENT DISPOSITION:  PACU - hemodynamically stable.   Delay start of Pharmacological VTE agent (>24hrs) due to surgical blood loss or risk of bleeding: no

## 2012-10-11 NOTE — Anesthesia Preprocedure Evaluation (Addendum)
Anesthesia Evaluation  Patient identified by MRN, date of birth, ID band Patient awake    Reviewed: Allergy & Precautions, H&P , NPO status , Patient's Chart, lab work & pertinent test results  History of Anesthesia Complications (+) AWARENESS UNDER ANESTHESIA  Airway Mallampati: II TM Distance: >3 FB Neck ROM: Full    Dental No notable dental hx.    Pulmonary neg pulmonary ROS,  breath sounds clear to auscultation  Pulmonary exam normal       Cardiovascular hypertension, Pt. on medications Rhythm:Regular Rate:Normal     Neuro/Psych negative neurological ROS  negative psych ROS   GI/Hepatic negative GI ROS, Neg liver ROS,   Endo/Other  Hypothyroidism Morbid obesity  Renal/GU negative Renal ROS  negative genitourinary   Musculoskeletal negative musculoskeletal ROS (+)   Abdominal   Peds negative pediatric ROS (+)  Hematology negative hematology ROS (+)   Anesthesia Other Findings   Reproductive/Obstetrics negative OB ROS                          Anesthesia Physical Anesthesia Plan  ASA: III  Anesthesia Plan: General   Post-op Pain Management:    Induction: Intravenous  Airway Management Planned: Oral ETT  Additional Equipment:   Intra-op Plan:   Post-operative Plan:   Informed Consent: I have reviewed the patients History and Physical, chart, labs and discussed the procedure including the risks, benefits and alternatives for the proposed anesthesia with the patient or authorized representative who has indicated his/her understanding and acceptance.   Dental advisory given  Plan Discussed with: CRNA and Surgeon  Anesthesia Plan Comments:         Anesthesia Quick Evaluation

## 2012-10-12 ENCOUNTER — Encounter (HOSPITAL_COMMUNITY): Payer: Self-pay | Admitting: Urology

## 2012-10-12 LAB — BASIC METABOLIC PANEL
BUN: 12 mg/dL (ref 6–23)
Chloride: 101 mEq/L (ref 96–112)
GFR calc Af Amer: 78 mL/min — ABNORMAL LOW (ref >90)
GFR calc non Af Amer: 67 mL/min — ABNORMAL LOW (ref >90)
Glucose, Bld: 102 mg/dL — ABNORMAL HIGH (ref 70–99)
Potassium: 4 mEq/L (ref 3.5–5.1)
Sodium: 135 mEq/L (ref 135–145)

## 2012-10-12 LAB — HEMOGLOBIN AND HEMATOCRIT, BLOOD: Hemoglobin: 9.3 g/dL — ABNORMAL LOW (ref 12.0–15.0)

## 2012-10-12 MED ORDER — SENNA-DOCUSATE SODIUM 8.6-50 MG PO TABS: 1.0000 | ORAL_TABLET | Freq: Two times a day (BID) | ORAL | Status: DC

## 2012-10-12 MED ORDER — NYSTATIN 100000 UNIT/ML MT SUSP: 500000.0000 [IU] | Freq: Four times a day (QID) | OROMUCOSAL | Status: AC

## 2012-10-12 MED ORDER — OXYCODONE-ACETAMINOPHEN 5-325 MG PO TABS: 1.0000 | ORAL_TABLET | ORAL | Status: DC | PRN

## 2012-10-12 MED ORDER — OXYBUTYNIN CHLORIDE 5 MG PO TABS: 5.0000 mg | ORAL_TABLET | Freq: Three times a day (TID) | ORAL | Status: DC | PRN

## 2012-10-12 NOTE — Discharge Summary (Signed)
Physician Discharge Summary  Patient ID: [name] MRN: [id number] DOB/AGE: 44-19-1967 44 y.o.  Admit date: 10/02/2012 Discharge date: 10/12/2012  Admission Diagnoses: Rt Complete Staghorn Kidney Stone, Left Renal Stone, HIV, Obesity,   Discharge Diagnoses: Rt Complete Staghorn Kidney Stone, Left Renal Stone, HIV, Obesity   Discharged Condition: fair  Hospital Course:  Pt underwent first stage rt percutaneous nephrostolithotomy on the day of admission without acute complications at which point she had 3 renal access tracts placed as well as left sided ureteral stent to allow passive dilation for future left sided surgery. She was kept in house and underwent second stage Rt percutaneous nephrostolithotomy on 12/19, 3rd stage on 12/23, and 4th and final stage on 12/27 at which time all of her nephrostomy tubes were removed and she was left with bilateral JJ stents internally. With aggressive staged approach we were abel to remove approximately 90-95% of Rt sided stone, two calyces remained with some radiographic evidence of stone that were not accessible endoscopically but were in in polar location and non-obstructing.    She had one bout of fevers likely due to atelectasis and remained negative by urine cultures after 3rd stage procedure. She also had bout of Thrush that was responding well to oral Nystatin which will be continued at discharge.  She required no transfusions and overall did very well with this admission for staged, aggressive treatment of her right complete staghorn kidney stone.  Consults: None  Significant Diagnostic Studies: labs: Hgb >9, Cr <1.2 at discharge.  Treatments: surgery: percutaneous nephrostolithotomy on right on 12/17, 12/19, 12/23, and 12/27 as per above. IV antibiotics.  Discharge Exam: Blood pressure 145/76, pulse 77, temperature 98.3 F (36.8 C), temperature source Oral, resp. rate 16, height 5\' 5"  (1.651 m), weight 110.7 kg (244 lb 0.8 oz), SpO2  96.00%. General appearance: cooperative Head: Normocephalic, without obvious abnormality, atraumatic Eyes: conjunctivae/corneas clear. PERRL, EOM's intact. Fundi benign. Ears: normal TM's and external ear canals both ears Nose: Nares normal. Septum midline. Mucosa normal. No drainage or sinus tenderness. Throat: lips, mucosa, and tongue normal; teeth and gums normal and improving oral thrush, now only minimal visible fungal burden. Neck: no adenopathy, no carotid bruit, no JVD, supple, symmetrical, trachea midline and thyroid not enlarged, symmetric, no tenderness/mass/nodules Back: symmetric, no curvature. ROM normal. No CVA tenderness. Resp: clear to auscultation bilaterally Chest wall: no tenderness Cardio: regular rate and rhythm, S1, S2 normal, no murmur, click, rub or gallop GI: soft, non-tender; bowel sounds normal; no masses,  no organomegaly Extremities: extremities normal, atraumatic, no cyanosis or edema Pulses: 2+ and symmetric Skin: Skin color, texture, turgor normal. No rashes or lesions Lymph nodes: Cervical, supraclavicular, and axillary nodes normal. Neurologic: Grossly normal Incision/Wound: Rt flank site c/d/i,. No purulence or drainage.   Disposition: 01-Home or Self Care     Medication List     As of 10/12/2012  7:10 AM    STOP taking these medications         acetaminophen 500 MG tablet   Commonly known as: TYLENOL      TAKE these medications         albuterol 108 (90 BASE) MCG/ACT inhaler   Commonly known as: PROVENTIL HFA;VENTOLIN HFA   Inhale 2 puffs into the lungs 4 (four) times daily as needed. For shortness of breath      ARTIFICIAL TEARS ophthalmic solution   Place 2 drops into both eyes every 4 (four) hours as needed. For dry eyes  ATRIPLA 600-200-300 MG per tablet   Generic drug: efavirenz-emtricitabine-tenofovir   take 1 tablet by mouth at bedtime      hydrochlorothiazide 12.5 MG capsule   Commonly known as: MICROZIDE   Take 12.5 mg  by mouth every morning.      ibuprofen 600 MG tablet   Commonly known as: ADVIL,MOTRIN   Take 600 mg by mouth every 6 (six) hours as needed.      levothyroxine 200 MCG tablet   Commonly known as: SYNTHROID, LEVOTHROID   Take 200 mcg by mouth every morning.      NIFEdipine 60 MG 24 hr tablet   Commonly known as: PROCARDIA XL/ADALAT-CC   Take 60 mg by mouth every morning.      nystatin 100000 UNIT/ML suspension   Commonly known as: MYCOSTATIN   Take 5 mLs (500,000 Units total) by mouth 4 (four) times daily. For Thrush      oxybutynin 5 MG tablet   Commonly known as: DITROPAN   Take 1 tablet (5 mg total) by mouth every 8 (eight) hours as needed. For bladder spasms      oxyCODONE-acetaminophen 5-325 MG per tablet   Commonly known as: PERCOCET/ROXICET   Take 1 tablet by mouth every 4 (four) hours as needed for pain.      sennosides-docusate sodium 8.6-50 MG tablet   Commonly known as: SENOKOT-S   Take 1 tablet by mouth 2 (two) times daily. While taking pain meds to prevent constipation           Follow-up Information    Follow up with [facility], Altha Sweitzer, MD. (We will contact you to arrance day surgery to remove stents in about 2 weeks)    Contact information:   [place]. [address] [place] [address] [phone]          Signed: Alexis Frock 10/12/2012, 7:10 AM

## 2012-10-12 NOTE — Op Note (Signed)
[name], [name]                ACCOUNT NO.:  [id number]  MEDICAL RECORD NO.:  [id number]  LOCATION:  48                         FACILITY:  [facility]  PHYSICIAN:  Alexis Frock, MD     DATE OF BIRTH:  1966-09-21  DATE OF PROCEDURE:10/11/2012 DATE OF DISCHARGE:                              OPERATIVE REPORT   DIAGNOSIS:  Residual right complete staghorn kidney stone.  PROCEDURES: 1. Right fourth-stage percutaneous nephrostolithotomy stone greater than     2 cm. 2. Right antegrade nephrostogram with interpretation. 3. Right ureteroscopy with laser lithotripsy. 4. Placement of right ureteral stent, 6 x 26, no tether.  DRAINS:  Foley catheter straight drain.  FINDINGS: 1. Several  residual stone fragments within the right kidney,     approximately total volume 2.5 cm, these were grasped and removed in     their entirety. 2. Multilevel stones within the right ureter, some requiring laser     lithotripsy, this was completely cleared. 3. Residual radiographic appearance of stone in upper pole and lower     pole, unable to be visualized endoscopically. 4. Removal of approximately greater than 90% of the total stone volume     on the right compared to pre-1st stage PCNL.  ESTIMATED BLOOD LOSS:  50 mL.  COMPLICATIONS:  None.  SPECIMENS:  Right renal stone fragments were given to family.  INDICATION:  [name] is a pleasant 46 year old lady with complete right staghorn kidney stone.  She has undergone staged percutaneous approach to this, having undergone procedures directed at her upper pole, lower pole and mid pole previously this hospitalization respectively.  She now presents for her fourth and final percutaneous approached surgery to her right staghorn stone.  Three accesses had been previously placed.  PROCEDURE IN DETAIL:  The patient being [name], was verified. Procedure being right four-stage percutaneous nephrostolithotomy was confirmed.  Procedure was carried  out.  Time-out was performed. Intravenous antibiotics were administered.  General endotracheal anesthesia was introduced.  The patient was then placed into a prone position employing chest rolls, prone view, padding as needs, and sequential pressure devices.  Sterile field was created by prepping and draping of the patient's entire right flank including her previously placed nephrostomy tube and nephroureteral stents into the operative field.  Initial antegrade nephrostogram was then obtained.  Right antegrade nephrostogram demonstrated some residual filling defect in the upper, mid, and lower pole consistent with some residual stone burden.  Excellent placement of nephrostomy tubes.  Next, the upper pole and lower pole nephrostomy tubes were carefully removed under fluoroscopy.  Next, flexible nephroscopy was performed via the lower pole tract.  Several areas of calcification of free-floating stones were encountered and these were very carefully and sequentially grasped and brought out in their entirety from the lower pole.  Flexible nephroscopy was performed at the upper pole and similarly several areas of free stone were found and grasped and brought out in their entirety. Inspection of the UPJ area also revealed some residual stone, which was grasped and brought out in its entirety.  Endoscopic examination of the kidney revealed complete resolution of all accessible stone fragments. There was two areas of persistent radiographic stone  in the upper pole and lower pole.  These were inaccessible via endoscopic technique with present access. Next, the right superior nephroureteral stent was exchanged for a superstiff wire, and flexible ureteroscopy was performed at the proximal right ureter.  Inspection revealed several stones in this location, these were easily grasped and brought out in their entirety.  There was several stones that were clearly visible beyond this.  An ureteral access  sheath 12/14 was then placed over a second Sensor wire from the upper pole toward the proximal ureter.  Following easier passage of the ureteroscope,  ureteroscopy was then performed to the remainder of the right ureter.  There were several areas of stone, they were larger and required laser lithotripsy, and this was performed using a holmium laser fiber at settings of 0.2 joules and 20 hertz.  These were sequentially grasped and brought out in their entirety, and the entire right ureter was cleared to the level of the urinary bladder.  Removal of the access sheath was performed while in complete endoscopy of the right ureter once again was revealed no mucosal abnormalities.  Next, a right 6 x 26 double-J stent was placed using the nephroscopic and fluoroscopic guidance.  Good proximal and distal curl were noted. Proximal curl was noted in the upper pole distal on the bladder.  Next, under continuous fluoroscopy, the remaining nephroureteral stents and all remaining wires were removed.  Each percutaneous tract was sealed using 1.75 mL of Tisseel followed by interrupted a Vicryl at the level of the skin.  The procedure was then terminated.  The patient tolerated the procedure well.  There were no immediate periprocedural complications.  The patient was taken to the postanesthesia care unit in stable condition.          ______________________________ Alexis Frock, MD     TM/MEDQ  D:  10/11/2012  T:  10/12/2012  Job:  [id number]

## 2012-10-15 NOTE — Anesthesia Postprocedure Evaluation (Signed)
  Anesthesia Post-op Note  Patient: [name]  Procedure(s) Performed: Procedure(s) (LRB): NEPHROLITHOTOMY PERCUTANEOUS SECOND LOOK (Right) URETERAL CATHETER OR STENT PLACEMENT (Right)  Patient Location: PACU  Anesthesia Type: General  Level of Consciousness: awake and alert   Airway and Oxygen Therapy: Patient Spontanous Breathing  Post-op Pain: mild  Post-op Assessment: Post-op Vital signs reviewed, Patient's Cardiovascular Status Stable, Respiratory Function Stable, Patent Airway and No signs of Nausea or vomiting  Last Vitals:  Filed Vitals:   10/12/12 0554  BP: 145/76  Pulse: 77  Temp: 36.8 C  Resp: 16    Post-op Vital Signs: stable   Complications: No apparent anesthesia complications

## 2012-10-18 ENCOUNTER — Other Ambulatory Visit: Payer: Self-pay | Admitting: Urology

## 2012-10-29 ENCOUNTER — Encounter (HOSPITAL_COMMUNITY): Payer: Self-pay | Admitting: Pharmacy Technician

## 2012-10-30 ENCOUNTER — Other Ambulatory Visit (HOSPITAL_COMMUNITY): Payer: Self-pay | Admitting: Urology

## 2012-10-30 NOTE — Progress Notes (Signed)
ekg 01-30-12 epic Chest 2 view 05-24-12 epic

## 2012-11-09 ENCOUNTER — Encounter (HOSPITAL_COMMUNITY): Payer: Self-pay | Admitting: Anesthesiology

## 2012-11-09 ENCOUNTER — Encounter (HOSPITAL_COMMUNITY): Payer: Self-pay | Admitting: *Deleted

## 2012-11-09 ENCOUNTER — Ambulatory Visit (HOSPITAL_COMMUNITY): Payer: Medicaid Other | Admitting: Anesthesiology

## 2012-11-09 ENCOUNTER — Ambulatory Visit (HOSPITAL_COMMUNITY)
Admission: RE | Admit: 2012-11-09 | Discharge: 2012-11-09 | Disposition: A | Discharge status: Home / Self Care | Payer: Medicaid Other | Source: Ambulatory Visit | Attending: Urology | Admitting: Urology

## 2012-11-09 ENCOUNTER — Encounter (HOSPITAL_COMMUNITY)
Admission: RE | Disposition: A | Discharge status: Home / Self Care | Payer: Self-pay | Source: Ambulatory Visit | Attending: Urology

## 2012-11-09 DIAGNOSIS — I129 Hypertensive chronic kidney disease with stage 1 through stage 4 chronic kidney disease, or unspecified chronic kidney disease: Secondary | ICD-10-CM | POA: Insufficient documentation

## 2012-11-09 DIAGNOSIS — K219 Gastro-esophageal reflux disease without esophagitis: Secondary | ICD-10-CM | POA: Insufficient documentation

## 2012-11-09 DIAGNOSIS — Q638 Other specified congenital malformations of kidney: Secondary | ICD-10-CM | POA: Insufficient documentation

## 2012-11-09 DIAGNOSIS — Z21 Asymptomatic human immunodeficiency virus [HIV] infection status: Secondary | ICD-10-CM | POA: Insufficient documentation

## 2012-11-09 DIAGNOSIS — J45909 Unspecified asthma, uncomplicated: Secondary | ICD-10-CM | POA: Insufficient documentation

## 2012-11-09 DIAGNOSIS — Z79899 Other long term (current) drug therapy: Secondary | ICD-10-CM | POA: Insufficient documentation

## 2012-11-09 DIAGNOSIS — N189 Chronic kidney disease, unspecified: Secondary | ICD-10-CM | POA: Insufficient documentation

## 2012-11-09 DIAGNOSIS — D869 Sarcoidosis, unspecified: Secondary | ICD-10-CM | POA: Insufficient documentation

## 2012-11-09 DIAGNOSIS — N200 Calculus of kidney: Secondary | ICD-10-CM | POA: Insufficient documentation

## 2012-11-09 DIAGNOSIS — E0590 Thyrotoxicosis, unspecified without thyrotoxic crisis or storm: Secondary | ICD-10-CM | POA: Insufficient documentation

## 2012-11-09 HISTORY — PX: CYSTOSCOPY W/ URETERAL STENT REMOVAL: SHX1430

## 2012-11-09 HISTORY — PX: CYSTOSCOPY WITH RETROGRADE PYELOGRAM, URETEROSCOPY AND STENT PLACEMENT: SHX5789

## 2012-11-09 LAB — BASIC METABOLIC PANEL
Chloride: 103 mEq/L (ref 96–112)
GFR calc Af Amer: 85 mL/min — ABNORMAL LOW (ref >90)
GFR calc non Af Amer: 74 mL/min — ABNORMAL LOW (ref >90)
Potassium: 4 mEq/L (ref 3.5–5.1)

## 2012-11-09 LAB — HCG, SERUM, QUALITATIVE: Preg, Serum: NEGATIVE

## 2012-11-09 LAB — SURGICAL PCR SCREEN
MRSA, PCR: NEGATIVE
Staphylococcus aureus: NEGATIVE

## 2012-11-09 LAB — CBC
HCT: 32.7 % — ABNORMAL LOW (ref 36.0–46.0)
Hemoglobin: 10.3 g/dL — ABNORMAL LOW (ref 12.0–15.0)
RDW: 16.6 % — ABNORMAL HIGH (ref 11.5–15.5)
WBC: 6.8 10*3/uL (ref 4.0–10.5)

## 2012-11-09 SURGERY — CYSTOURETEROSCOPY, WITH RETROGRADE PYELOGRAM AND STENT INSERTION
Anesthesia: General | Laterality: Right | Wound class: Clean Contaminated

## 2012-11-09 MED ORDER — IOHEXOL 300 MG/ML  SOLN
INTRAMUSCULAR | Status: AC
  Filled 2012-11-09: qty 1

## 2012-11-09 MED ORDER — OXYCODONE-ACETAMINOPHEN 5-325 MG PO TABS: 1.0000 | ORAL_TABLET | ORAL | Status: DC | PRN

## 2012-11-09 MED ORDER — SODIUM CHLORIDE 0.9 % IR SOLN
Status: DC | PRN
  Administered 2012-11-09: 4000 mL

## 2012-11-09 MED ORDER — LACTATED RINGERS IV SOLN
INTRAVENOUS | Status: DC | PRN
  Administered 2012-11-09 (×2): via INTRAVENOUS

## 2012-11-09 MED ORDER — PHENYLEPHRINE HCL 10 MG/ML IJ SOLN
INTRAMUSCULAR | Status: DC | PRN
  Administered 2012-11-09: 40 ug via INTRAVENOUS

## 2012-11-09 MED ORDER — ACETAMINOPHEN 10 MG/ML IV SOLN
INTRAVENOUS | Status: AC
  Filled 2012-11-09: qty 100

## 2012-11-09 MED ORDER — MUPIROCIN 2 % EX OINT
TOPICAL_OINTMENT | Freq: Two times a day (BID) | CUTANEOUS | Status: DC
  Administered 2012-11-09: 1 via NASAL
  Filled 2012-11-09: qty 22

## 2012-11-09 MED ORDER — PROMETHAZINE HCL 25 MG/ML IJ SOLN: 6.2500 mg | INTRAMUSCULAR | Status: DC | PRN

## 2012-11-09 MED ORDER — DEXAMETHASONE SODIUM PHOSPHATE 4 MG/ML IJ SOLN
INTRAMUSCULAR | Status: DC | PRN
  Administered 2012-11-09: 10 mg via INTRAVENOUS

## 2012-11-09 MED ORDER — MIDAZOLAM HCL 5 MG/5ML IJ SOLN
INTRAMUSCULAR | Status: DC | PRN
  Administered 2012-11-09: 2 mg via INTRAVENOUS

## 2012-11-09 MED ORDER — ONDANSETRON HCL 4 MG/2ML IJ SOLN
INTRAMUSCULAR | Status: DC | PRN
  Administered 2012-11-09: 4 mg via INTRAVENOUS

## 2012-11-09 MED ORDER — SULFAMETHOXAZOLE-TRIMETHOPRIM 400-80 MG PO TABS: 1.0000 | ORAL_TABLET | Freq: Two times a day (BID) | ORAL | Status: DC

## 2012-11-09 MED ORDER — ESMOLOL HCL 10 MG/ML IV SOLN
INTRAVENOUS | Status: DC | PRN
  Administered 2012-11-09: 30 mg via INTRAVENOUS

## 2012-11-09 MED ORDER — HYDROMORPHONE HCL PF 1 MG/ML IJ SOLN: 0.2500 mg | INTRAMUSCULAR | Status: DC | PRN

## 2012-11-09 MED ORDER — LACTATED RINGERS IV SOLN: INTRAVENOUS | Status: DC

## 2012-11-09 MED ORDER — PROPOFOL 10 MG/ML IV BOLUS
INTRAVENOUS | Status: DC | PRN
  Administered 2012-11-09: 200 mg via INTRAVENOUS

## 2012-11-09 MED ORDER — SENNA-DOCUSATE SODIUM 8.6-50 MG PO TABS: 1.0000 | ORAL_TABLET | Freq: Two times a day (BID) | ORAL | Status: DC

## 2012-11-09 MED ORDER — FENTANYL CITRATE 0.05 MG/ML IJ SOLN
INTRAMUSCULAR | Status: DC | PRN
  Administered 2012-11-09: 25 ug via INTRAVENOUS
  Administered 2012-11-09 (×2): 50 ug via INTRAVENOUS
  Administered 2012-11-09: 100 ug via INTRAVENOUS
  Administered 2012-11-09: 50 ug via INTRAVENOUS
  Administered 2012-11-09: 25 ug via INTRAVENOUS
  Administered 2012-11-09 (×2): 50 ug via INTRAVENOUS

## 2012-11-09 MED ORDER — PIPERACILLIN-TAZOBACTAM 3.375 G IVPB 30 MIN
3.3750 g | Freq: Once | INTRAVENOUS | Status: AC
  Administered 2012-11-09: 3.375 g via INTRAVENOUS
  Filled 2012-11-09: qty 50

## 2012-11-09 MED ORDER — METOCLOPRAMIDE HCL 5 MG/ML IJ SOLN
INTRAMUSCULAR | Status: DC | PRN
  Administered 2012-11-09: 10 mg via INTRAVENOUS

## 2012-11-09 MED ORDER — IOHEXOL 300 MG/ML  SOLN
INTRAMUSCULAR | Status: DC | PRN
  Administered 2012-11-09: 25 mL

## 2012-11-09 SURGICAL SUPPLY — 19 items
BAG URINE DRAINAGE (UROLOGICAL SUPPLIES) ×3 IMPLANT
BASKET LASER NITINOL 1.9FR (BASKET) ×3 IMPLANT
BASKET STNLS GEMINI 4WIRE 3FR (BASKET) IMPLANT
BASKET ZERO TIP NITINOL 2.4FR (BASKET) IMPLANT
CATH INTERMIT  6FR 70CM (CATHETERS) IMPLANT
DRAPE CAMERA CLOSED 9X96 (DRAPES) ×3 IMPLANT
ELECT REM PT RETURN 9FT ADLT (ELECTROSURGICAL)
ELECTRODE REM PT RTRN 9FT ADLT (ELECTROSURGICAL) IMPLANT
GLOVE BIOGEL M STRL SZ7.5 (GLOVE) ×3 IMPLANT
GOWN PREVENTION PLUS LG XLONG (DISPOSABLE) ×3 IMPLANT
GUIDEWIRE ANG ZIPWIRE 038X150 (WIRE) ×3 IMPLANT
GUIDEWIRE STR DUAL SENSOR (WIRE) ×3 IMPLANT
IV NS IRRIG 3000ML ARTHROMATIC (IV SOLUTION) ×3 IMPLANT
LASER FIBER DISP (UROLOGICAL SUPPLIES) IMPLANT
PACK CYSTO (CUSTOM PROCEDURE TRAY) ×3 IMPLANT
SHEATH ACCESS URETERAL 24CM (SHEATH) ×3 IMPLANT
SYRINGE 10CC LL (SYRINGE) IMPLANT
SYRINGE IRR TOOMEY STRL 70CC (SYRINGE) IMPLANT
TUBE FEEDING 8FR 16IN STR KANG (MISCELLANEOUS) ×3 IMPLANT

## 2012-11-09 NOTE — Preoperative (Signed)
Beta Blockers   Reason not to administer Beta Blockers:Not Applicable, not on home BB 

## 2012-11-09 NOTE — Anesthesia Procedure Notes (Signed)
Procedure Name: LMA Insertion Date/Time: 11/09/2012 8:45 AM Performed by: [name] [name] Pre-anesthesia Checklist: Patient identified, Emergency Drugs available, Suction available and Patient being monitored Patient Re-evaluated:Patient Re-evaluated prior to inductionOxygen Delivery Method: Circle system utilized Preoxygenation: Pre-oxygenation with 100% oxygen Intubation Type: IV induction Ventilation: Mask ventilation without difficulty LMA: LMA inserted LMA Size: 5.0 Number of attempts: 1 Tube secured with: Tape Dental Injury: Teeth and Oropharynx as per pre-operative assessment

## 2012-11-09 NOTE — Transfer of Care (Signed)
Immediate Anesthesia Transfer of Care Note  Patient: [name]  Procedure(s) Performed: Procedure(s) (LRB) with comments: CYSTOSCOPY WITH RETROGRADE PYELOGRAM, URETEROSCOPY AND STENT PLACEMENT (Left) - LEFT URETEROSCOPY, STONE MANIPULATION, left STENT exchange  CYSTOSCOPY WITH STENT REMOVAL (Right)  Patient Location: PACU  Anesthesia Type:General  Level of Consciousness: awake, alert , oriented, patient cooperative and responds to stimulation  Airway & Oxygen Therapy: Patient Spontanous Breathing and Patient connected to face mask oxygen  Post-op Assessment: Report given to PACU RN, Post -op Vital signs reviewed and stable and Patient moving all extremities X 4  Post vital signs: Reviewed and stable  Complications: No apparent anesthesia complications

## 2012-11-09 NOTE — Progress Notes (Signed)
Spoke to dr Tresa Moore re:bp (see vs) ok to dc home

## 2012-11-09 NOTE — Anesthesia Preprocedure Evaluation (Addendum)
Anesthesia Evaluation  Patient identified by MRN, date of birth, ID band Patient awake    Reviewed: Allergy & Precautions, H&P , NPO status , Patient's Chart, lab work & pertinent test results  Airway Mallampati: III TM Distance: >3 FB Neck ROM: full    Dental No notable dental hx. (+) Teeth Intact and Dental Advisory Given   Pulmonary asthma , Current Smoker,  Sarcoidosis 4/08 breath sounds clear to auscultation  Pulmonary exam normal       Cardiovascular Exercise Tolerance: Good hypertension, On Medications Rhythm:regular Rate:Normal  Prolonged QT   Neuro/Psych negative neurological ROS  negative psych ROS   GI/Hepatic negative GI ROS, Neg liver ROS,   Endo/Other  Hypothyroidism Hyperthyroidism Morbid obesityGraves disease  Renal/GU negative Renal ROS  negative genitourinary   Musculoskeletal   Abdominal   Peds  Hematology negative hematology ROS (+) Blood dyscrasia, anemia , HIV, Hgb. 9.7   Anesthesia Other Findings   Reproductive/Obstetrics negative OB ROS                           Anesthesia Physical Anesthesia Plan  ASA: III  Anesthesia Plan: General   Post-op Pain Management:    Induction: Intravenous  Airway Management Planned: Oral ETT  Additional Equipment:   Intra-op Plan:   Post-operative Plan: Extubation in OR  Informed Consent: I have reviewed the patients History and Physical, chart, labs and discussed the procedure including the risks, benefits and alternatives for the proposed anesthesia with the patient or authorized representative who has indicated his/her understanding and acceptance.   Dental advisory given  Plan Discussed with: CRNA  Anesthesia Plan Comments:        Anesthesia Quick Evaluation

## 2012-11-09 NOTE — H&P (Signed)
[name] is an 47 y.o. female.   Chief Complaint: Pre-OP Left Ureteroscopic Stone Manipulation HPI:   1 - Bilateral Nephrolithiasis / RT Complete Staghorn Stone - s/p 4 stage Rt PCNL for complete rt staghorn stone 09/2012 (removed 90% of total stone). Also with left lower pole 48mm (non-obstructing) stone found in functionally dominant kidney. Left side stented in preparation for ureteroscopy. She does have h/o sarcoid, though recent calcium normal. Split funciton on renogram 08/2012 Rt 35%, Lt 65%, No obscturction.  Most recent UCX neg, though previously with Morganella sens to Bactrim, Amp, Zosyn.  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  3 - Rt Renal Atrophy - Split funciton on renogram 08/2012 Rt 35%, Lt 65%, No obscturction  PMH sig for morbid obesity, Sarcoid, HIV, Thydroid disease.  Today [name] is seen for left ureteroscopic stone manipulation. No interval fevers.  Past Medical History  Diagnosis Date  . Hyperthyroidism 08/2006    Grave's Disease -diffuse radiotracer uptake 08/25/06 Thyroid scan-Cold nodule to R lower lobe of thyrorid  . HIV DISEASE 07/24/2006    dx March 05  . Sarcoidosis 02/08/2007    dx as a teenager in [place] from abnl CXR. Completed 2 yrs Prednisone after lung bx confirmation. No symptoms since then.  . THYROID NODULE, RIGHT 02/08/2007  . GRAVE'S DISEASE 01/01/2008  . 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 HYPERTENSION 07/24/2006  . ASTHMA 07/24/2006  . GERD 07/24/2006  . 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 Anemia     Normocytic  . History of hidradenitis suppurativa   . Gastroenteritis 07/10/07  . Menometrorrhagia     hx of  . History of thrush   . Bronchitis 2005  . Pneumonia 2005    Past Surgical History  Procedure Date  . Breast surgery 1997    Breast Reduction   . Total thyroidectomy 2010  . [place] 1994  . Dilation and curettage of  uterus Feb 2004    s/p for 1st trimester nonviable pregnancy  . Incision and drainage peritonsillar abscess Mar 03  . Incise and drain abcess Nov 03    s/p I &D for righ inframmary fold hidradenitis  . Irrigation and debridement abscess 01/31/2012    Procedure: IRRIGATION AND DEBRIDEMENT ABSCESS;  Surgeon: Shann Medal, MD;  Location: [facility];  Service: General;  Laterality: Right;  right breast and axilla   . Eye surgery     sty under eyelid  . Nephrolithotomy 10/02/2012    Procedure: NEPHROLITHOTOMY PERCUTANEOUS;  Surgeon: Alexis Frock, MD;  Location: [facility];  Service: Urology;  Laterality: Right;  First Stage Percutaneous Nephrolithotomy with Surgeon Access, Left Ureteral Stent    . Cystoscopy with stent placement 10/02/2012    Procedure: CYSTOSCOPY WITH STENT PLACEMENT;  Surgeon: Alexis Frock, MD;  Location: [facility];  Service: Urology;  Laterality: Left;  . Nephrolithotomy 10/04/2012    Procedure: NEPHROLITHOTOMY PERCUTANEOUS SECOND LOOK;  Surgeon: Alexis Frock, MD;  Location: [facility];  Service: Urology;  Laterality: Right;     . Nephrolithotomy 10/08/2012    Procedure: NEPHROLITHOTOMY PERCUTANEOUS;  Surgeon: Alexis Frock, MD;  Location: [facility];  Service: Urology;  Laterality: Right;  THIRD STAGE, nephrostomy tube exchange x 2  . Nephrolithotomy 10/11/2012    Procedure: NEPHROLITHOTOMY PERCUTANEOUS SECOND LOOK;  Surgeon: Alexis Frock, MD;  Location: [facility];  Service: Urology;  Laterality: Right;  RIGHT 4 STAGE PERCUTANOUS NEPHROLITHOTOMY, right URETEROSCOPY WITH HOLMIUM  LASER     Family History  Problem Relation Age of Onset  . Cancer Other     FH of Breast Cancer  . Cancer Other     Fh of Lung Cancer  . Heart disease Other   . Hypertension Other   . Stroke Other     Grandparent  . Kidney disease Other     Grandparent  . Diabetes Other     FH of Diabetes  . Hypertension Mother   . Cancer Mother     throat  . Hypertension Father   . Hypertension Sister   . Cancer  Maternal Uncle     Stomach Cancer   Social History:  reports that she has been smoking Cigarettes.  She has a 4.5 pack-year smoking history. She has never used smokeless tobacco. She reports that she does not drink alcohol or use illicit drugs.  Allergies:  Allergies  Allergen Reactions  . Tape Rash    Rash   . Lisinopril Cough  . Xylocaine (Lidocaine)     Unknown     Medications Prior to Admission  Medication Sig Dispense Refill  . albuterol (PROVENTIL HFA;VENTOLIN HFA) 108 (90 BASE) MCG/ACT inhaler Inhale 2 puffs into the lungs 4 (four) times daily as needed. For shortness of breath      . ARTIFICIAL TEARS ophthalmic solution Place 2 drops into both eyes every 4 (four) hours as needed. For dry eyes      . ATRIPLA 600-200-300 MG per tablet take 1 tablet by mouth at bedtime  30 tablet  10  . hydrochlorothiazide (MICROZIDE) 12.5 MG capsule Take 12.5 mg by mouth every morning.      Marland Kitchen NIFEdipine (PROCARDIA XL/ADALAT-CC) 60 MG 24 hr tablet Take 60 mg by mouth every morning.         No results found for this or any previous visit (from the past 48 hour(s)). No results found.  Review of Systems  Constitutional: Negative.  Negative for fever and chills.  HENT: Negative.   Eyes: Negative.   Respiratory: Negative.   Cardiovascular: Negative.   Gastrointestinal: Negative.   Genitourinary: Negative.  Negative for dysuria, urgency, hematuria and flank pain.  Musculoskeletal: Negative.   Skin: Negative.   Neurological: Negative.   Endo/Heme/Allergies: Negative.   Psychiatric/Behavioral: Negative.     Blood pressure 175/90, pulse 81, temperature 98 F (36.7 C), temperature source Oral, resp. rate 18, height 5\' 5"  (1.651 m), weight 112.038 kg (247 lb), last menstrual period 09/13/2012, SpO2 99.00%. Physical Exam  Constitutional: She is oriented to person, place, and time. She appears well-developed and well-nourished.  HENT:  Head: Normocephalic and atraumatic.  Eyes: EOM are  normal. Pupils are equal, round, and reactive to light.  Neck: Normal range of motion. Neck supple.  Cardiovascular: Normal rate.   Respiratory: Effort normal.  GI: Soft. Bowel sounds are normal.  Genitourinary:       No CVAT. Old PCNL sites c/d/i.  Musculoskeletal: Normal range of motion.  Neurological: She is alert and oriented to person, place, and time.  Skin: Skin is warm and dry.  Psychiatric: She has a normal mood and affect. Her behavior is normal. Judgment and thought content normal.     Assessment/Plan  1 - Bilateral Nephrolithiasis / RT Complete Staghorn Stone - Pt now presents for elective management of her left sided stone, as it is in her functionally dominant kidney.  We re-discussed ureteroscopic stone manipulation with basketing and laser-lithotripsy in detail.  We discussed risks including  bleeding, infection, damage to kidney / ureter  bladder, rarely loss of kidney. We discussed anesthetic risks and rare but serious surgical complications including DVT, PE, MI, and mortality. We specifically addressed that in 5-10% of cases a staged approach is required with stenting followed by re-attempt ureteroscopy if anatomy unfavorable. The patient voiced understanding and wises to proceed.     Roshaun Pound 11/09/2012, 6:54 AM

## 2012-11-09 NOTE — Brief Op Note (Signed)
11/09/2012  9:46 AM  PATIENT:  [name]  47 y.o. female  PRE-OPERATIVE DIAGNOSIS:  LEFT RENAL STONE, RIGHT URETERAL STENT  POST-OPERATIVE DIAGNOSIS:  left renal stone, right ureteral stent  PROCEDURE:  Procedure(s) (LRB) with comments: CYSTOSCOPY WITH RETROGRADE PYELOGRAM, URETEROSCOPY AND STENT PLACEMENT (Left) - LEFT URETEROSCOPY, STONE MANIPULATION, left STENT exchange  CYSTOSCOPY WITH STENT REMOVAL (Right)  SURGEON:  Surgeon(s) and Role:    * Alexis Frock, MD - Primary  PHYSICIAN ASSISTANT:   ASSISTANTS: none   ANESTHESIA:   general  EBL:     BLOOD ADMINISTERED:none  DRAINS: none   LOCAL MEDICATIONS USED:  NONE  SPECIMEN:  Source of Specimen:  left renal pelvis - stone  DISPOSITION OF SPECIMEN:  Given to pt  COUNTS:  YES  TOURNIQUET:  * No tourniquets in log *  DICTATION: .Other Dictation: Dictation Number [id number]  PLAN OF CARE: Discharge to home after PACU  PATIENT DISPOSITION:  PACU - hemodynamically stable.   Delay start of Pharmacological VTE agent (>24hrs) due to surgical blood loss or risk of bleeding: no

## 2012-11-09 NOTE — Op Note (Signed)
NAMERYE, DWELLE                ACCOUNT NO.:  [id number]  MEDICAL RECORD NO.:  [id number]  LOCATION:  [facility]                         FACILITY:  [facility]  PHYSICIAN:  Alexis Frock, MD     DATE OF BIRTH:  29-Jul-1966  DATE OF PROCEDURE:  11/09/2012 DATE OF DISCHARGE:  11/09/2012                              OPERATIVE REPORT   PREOPERATIVE DIAGNOSES: 1. History of right complete staghorn calculus. 2. Left renal stones.  PROCEDURES: 1. Left ureteroscopic stone manipulation, basketing of stone. 2. Left ureteral stent exchange. 3. Left retrograde pyelogram and interpretation. 4. Right ureteral stent removal.  FINDINGS: 1. Left intrarenal stones, mostly in the lower pole, total stone     volume approximately 1 cm2. 2. Unremarkable urinary bladder.  SPECIMENS: 1. Bilateral ureteral stents for discard, inspected, and intact. 2. Left renal stone given to the patient.  COMPLICATIONS:  None.  ESTIMATED BLOOD LOSS:  Nil.  INDICATION:  [name] is a pleasant 47 year old lady with history of HIV, morbid obesity, and complex urolithiasis with recent right complete staghorn stone and left renal stone.  Her left kidney is dominant.  Her right kidney only has 33% function.  She underwent a right 4-stage percutaneous nephrostolithotomy approach through staghorn stone in December 2013, which addressed right renal stone burden.  She now presents for definitive management of her left-sided stone as well as removal of previous right-sided stent.  Informed consent has been placed in medical record.  PROCEDURE IN DETAIL:  The patient being [name], procedure being left ureteroscopy, stent change, and right stent removal was confirmed. Procedure was carried out.  Time-out was performed.  Intravenous antibiotics administered.  General LMA anesthesia was introduced.  The patient was placed into a low lithotomy position.  Sterile field was created by prepping and draping the patient's vagina,  introitus, and proximal thighs using iodine x3. Next, cystourethroscopy was performed using a 22-French rigid cystoscope with 12-degree offset lens. Inspection of bladder revealed distal end of bilateral ureteral stents in situ.  There was a mild encrustation.  The distal end of the right stent was grasped and brought out in its entirety.  The stent was inspected and intact, set aside for discard.  The distal end of the left stent was grasped and brought out in its entirety with mild encrustation, it was intact and set aside for discard.  Next, the left ureter was gently cannulated using a 6-French end-hole catheter and left retrograde pyelogram was obtained.  Left retrograde pyelogram showed a single left ureter single system left kidney.  There was some filling defects in the left renal pelvis, consistent with a known stone.  A 0.038 Glidewire was advanced at the level of the lower pole and set aside as a safety wire.  Next, the cystoscope was exchanged for the 6.4-French semi-rigid ureteroscope and semi-rigid ureteroscopy was performed at the entire length of the left ureter.  No mucosal abnormalities or calcifications were detected. Next, the semi-rigid ureteroscope was exchanged for a 24-cm 12/14 ureteral access sheath, over a sensor working wire using fluoroscopic guidance which was placed at the level of the proximal ureter.  An 8- [place] feeding tube was placed to the  level of the urinary bladder for pressure release.  Next, flexible ureteroscopy was performed using 8- [place] digital ureteroscope at proximal ureter and entire left kidney. There were some calcifications in the left renal pelvis.  These were small and somewhat soft and amenable to simple basketing and removed in their entirety.  There are dominant calcifications in the lower pole. Fortunately, these were also relatively small and soft and amenable to simple basketing and removed in their entirety.  Stone  volume approximately 1 cm2.  Repeat endoscopic examination revealed removal of all stones.  Excellent hemostasis.  No evidence of perforation.  Next, the ureteral access sheath was removed under continuous ureteroscopic position.  No mucosal abnormalities were found of ureter.  Finally, a new 6 x 24 double-J stent was placed over the remaining safety wire. Using fluoroscopic guidance, good proximal and distal curl were noted. Tether was left in place and fashioned to the patient's thigh.  Bladder was emptied per cystoscope.  Procedure was then terminated.  The patient tolerated the procedure well and there were no immediate periprocedural complications.  The patient was taken to the postanesthesia care unit in stable condition.          ______________________________ Alexis Frock, MD     TM/MEDQ  D:  11/09/2012  T:  11/09/2012  Job:  [id number]

## 2012-11-09 NOTE — Anesthesia Postprocedure Evaluation (Signed)
Anesthesia Post Note  Patient: [name]  Procedure(s) Performed: Procedure(s) (LRB): CYSTOSCOPY WITH RETROGRADE PYELOGRAM, URETEROSCOPY AND STENT PLACEMENT (Left) CYSTOSCOPY WITH STENT REMOVAL (Right)  Anesthesia type: General  Patient location: PACU  Post pain: Pain level controlled  Post assessment: Post-op Vital signs reviewed  Last Vitals:  Filed Vitals:   11/09/12 1030  BP: 181/96  Pulse: 87  Temp: 36.8 C  Resp: 15    Post vital signs: Reviewed  Level of consciousness: sedated  Complications: No apparent anesthesia complications

## 2012-11-12 ENCOUNTER — Encounter (HOSPITAL_COMMUNITY): Payer: Self-pay | Admitting: Urology

## 2012-11-12 ENCOUNTER — Telehealth: Payer: Self-pay | Admitting: *Deleted

## 2012-11-12 NOTE — Telephone Encounter (Signed)
Message left to call [facility] to schedule f/u Lab, MD and PAP smear appts.  Number given.

## 2012-11-19 ENCOUNTER — Encounter: Payer: Self-pay | Admitting: Physician Assistant

## 2012-12-18 ENCOUNTER — Ambulatory Visit (INDEPENDENT_AMBULATORY_CARE_PROVIDER_SITE_OTHER): Payer: Medicaid Other | Admitting: General Surgery

## 2012-12-18 ENCOUNTER — Encounter (INDEPENDENT_AMBULATORY_CARE_PROVIDER_SITE_OTHER): Payer: Self-pay | Admitting: General Surgery

## 2012-12-18 VITALS — BP 110/88 | HR 80 | Temp 97.4°F | Resp 18 | Ht 65.0 in | Wt 255.0 lb

## 2012-12-18 DIAGNOSIS — N611 Abscess of the breast and nipple: Secondary | ICD-10-CM

## 2012-12-18 DIAGNOSIS — N61 Mastitis without abscess: Secondary | ICD-10-CM

## 2012-12-18 MED ORDER — HYDROCODONE-ACETAMINOPHEN 5-325 MG PO TABS: 1.0000 | ORAL_TABLET | Freq: Four times a day (QID) | ORAL | Status: DC | PRN

## 2012-12-18 MED ORDER — DOXYCYCLINE HYCLATE 100 MG PO TABS: 100.0000 mg | ORAL_TABLET | Freq: Two times a day (BID) | ORAL | Status: DC

## 2012-12-18 NOTE — Progress Notes (Signed)
Patient ID: [name], female   DOB: Nov 22, 1965, 47 y.o.   MRN: [id number]  Chief Complaint  Patient presents with  . Follow-up    Rt br abscess    HPI [name] is a 47 y.o. female.   HPI  She is self-referred and presents with a right breast abscess. She noticed a small bump late last week that has progressively enlarged. She was thinking it would drain spontaneously. She's had multiple abscesses drained in the past. She has hidradenitis of the axillary and breast areas. In April of 2013 she had some breast abscesses drained in the wounds have not completely healed from that. She is HIV positive.  She reports having a subjective fever.  Past Medical History  Diagnosis Date  . Hyperthyroidism 08/2006    Grave's Disease -diffuse radiotracer uptake 08/25/06 Thyroid scan-Cold nodule to R lower lobe of thyrorid  . HIV DISEASE 07/24/2006    dx March 05  . Sarcoidosis 02/08/2007    dx as a teenager in [place] from abnl CXR. Completed 2 yrs Prednisone after lung bx confirmation. No symptoms since then.  . THYROID NODULE, RIGHT 02/08/2007  . GRAVE'S DISEASE 01/01/2008  . 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 HYPERTENSION 07/24/2006  . ASTHMA 07/24/2006  . GERD 07/24/2006  . 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 Anemia     Normocytic  . History of hidradenitis suppurativa   . Gastroenteritis 07/10/07  . Menometrorrhagia     hx of  . History of thrush   . Bronchitis 2005  . Pneumonia 2005    Past Surgical History  Procedure Laterality Date  . Total thyroidectomy  2010  . [place]  1994  . Dilation and curettage of uterus  Feb 2004    s/p for 1st trimester nonviable pregnancy  . Incision and drainage peritonsillar abscess  Mar 03  . Incise and drain abcess  Nov 03    s/p I &D for righ inframmary fold hidradenitis  . Irrigation and debridement abscess  01/31/2012    Procedure: IRRIGATION AND DEBRIDEMENT ABSCESS;  Surgeon: Shann Medal, MD;  Location: [facility];  Service:  General;  Laterality: Right;  right breast and axilla   . Eye surgery      sty under eyelid  . Nephrolithotomy  10/02/2012    Procedure: NEPHROLITHOTOMY PERCUTANEOUS;  Surgeon: Alexis Frock, MD;  Location: [facility];  Service: Urology;  Laterality: Right;  First Stage Percutaneous Nephrolithotomy with Surgeon Access, Left Ureteral Stent    . Cystoscopy with stent placement  10/02/2012    Procedure: CYSTOSCOPY WITH STENT PLACEMENT;  Surgeon: Alexis Frock, MD;  Location: [facility];  Service: Urology;  Laterality: Left;  . Nephrolithotomy  10/04/2012    Procedure: NEPHROLITHOTOMY PERCUTANEOUS SECOND LOOK;  Surgeon: Alexis Frock, MD;  Location: [facility];  Service: Urology;  Laterality: Right;     . Nephrolithotomy  10/08/2012    Procedure: NEPHROLITHOTOMY PERCUTANEOUS;  Surgeon: Alexis Frock, MD;  Location: [facility];  Service: Urology;  Laterality: Right;  THIRD STAGE, nephrostomy tube exchange x 2  . Nephrolithotomy  10/11/2012    Procedure: NEPHROLITHOTOMY PERCUTANEOUS SECOND LOOK;  Surgeon: Alexis Frock, MD;  Location: [facility];  Service: Urology;  Laterality: Right;  RIGHT 4 STAGE PERCUTANOUS NEPHROLITHOTOMY, right URETEROSCOPY WITH HOLMIUM LASER   . Cystoscopy with retrograde pyelogram, ureteroscopy and stent placement  11/09/2012    Procedure: [place], URETEROSCOPY AND STENT PLACEMENT;  Surgeon: Alexis Frock, MD;  Location: [facility];  Service: Urology;  Laterality: Left;  LEFT URETEROSCOPY, STONE MANIPULATION, left STENT exchange   . Cystoscopy w/ ureteral stent removal  11/09/2012    Procedure: CYSTOSCOPY WITH STENT REMOVAL;  Surgeon: Alexis Frock, MD;  Location: [facility];  Service: Urology;  Laterality: Right;  . Breast surgery  1997    Breast Reduction     Family History  Problem Relation Age of Onset  . Cancer Other     FH of Breast Cancer  . Cancer Other     Fh of Lung Cancer  . Heart disease Other   . Hypertension Other   . Stroke Other      Grandparent  . Kidney disease Other     Grandparent  . Diabetes Other     FH of Diabetes  . Hypertension Mother   . Cancer Mother     throat  . Hypertension Father   . Hypertension Sister   . Cancer Maternal Uncle     Stomach Cancer    Social History History  Substance Use Topics  . Smoking status: Former Smoker -- 0.30 packs/day for 15 years    Types: Cigarettes    Quit date: 10/02/2012  . Smokeless tobacco: Never Used  . Alcohol Use: No    Allergies  Allergen Reactions  . Tape Rash    Rash   . Lisinopril Cough  . Xylocaine (Lidocaine)     Unknown , patient states she has had lidocaine for IV starts and at the dentist with no problems    Current Outpatient Prescriptions  Medication Sig Dispense Refill  . albuterol (PROVENTIL HFA;VENTOLIN HFA) 108 (90 BASE) MCG/ACT inhaler Inhale 2 puffs into the lungs 4 (four) times daily as needed. For shortness of breath      . ARTIFICIAL TEARS ophthalmic solution Place 2 drops into both eyes every 4 (four) hours as needed. For dry eyes      . ATRIPLA 600-200-300 MG per tablet take 1 tablet by mouth at bedtime  30 tablet  10  . hydrochlorothiazide (MICROZIDE) 12.5 MG capsule Take 12.5 mg by mouth every morning.      Marland Kitchen levothyroxine (SYNTHROID, LEVOTHROID) 200 MCG tablet Take 200 mcg by mouth daily.      Marland Kitchen NIFEdipine (PROCARDIA XL/ADALAT-CC) 60 MG 24 hr tablet Take 60 mg by mouth every morning.       . sennosides-docusate sodium (SENOKOT-S) 8.6-50 MG tablet Take 1 tablet by mouth 2 (two) times daily. While taking pain meds.  30 tablet  0  . doxycycline (VIBRA-TABS) 100 MG tablet Take 1 tablet (100 mg total) by mouth 2 (two) times daily.  42 tablet  0  . HYDROcodone-acetaminophen (NORCO) 5-325 MG per tablet Take 1-2 tablets by mouth every 6 (six) hours as needed for pain.  30 tablet  1   No current facility-administered medications for this visit.    Review of Systems Review of Systems  Constitutional: Positive for fever. Negative  for chills.  Respiratory:       Right breast pain.    Blood pressure 110/88, pulse 80, temperature 97.4 F (36.3 C), temperature source Temporal, resp. rate 18, height 5\' 5"  (1.651 m), weight 255 lb (115.667 kg).  Physical Exam Physical Exam  Constitutional:  Obese female in no acute distress.  Pulmonary/Chest:  5 cm lateral fluctuant area in the right breast with surrounding erythema. Tenderness is present but I am able to examine the area.  Multiple axillary scars and breast carcinoma. 2 small open breast wounds in the inferomedial aspect  there are clean.    Data Reviewed Notes from old chart.  Assessment    Right breast abscess.     Plan    Incision and drainage, doxycycline for 3 weeks. Return visit in 3 weeks.   Procedure: The right lateral breast area were sterilely prepped. It was anesthetized with Xylocaine. A cruciate incision was made over the fluctuant area and approximately 100 cc of pus was drained out of the breast. I then cut the corners of the cruciate incision opening up the cavity. The cavity was probed and loculations were broken up. The cavity was then tightly packed with iodoform gauze followed by a bulky dry dressing. She tolerated the procedure well. She was given wound care instructions. She will be given Norco for pain.        ROSENBOWER,TODD J 12/18/2012, 4:12 PM

## 2012-12-18 NOTE — Patient Instructions (Addendum)
Remove bandage and packing in 24 hours.  Clean wound in the shower twice and day and apply a dry, bulky bandage.  Call for heavy, uncontrollable bleeding.

## 2013-01-06 ENCOUNTER — Other Ambulatory Visit: Payer: Self-pay | Admitting: Internal Medicine

## 2013-01-06 DIAGNOSIS — B20 Human immunodeficiency virus [HIV] disease: Secondary | ICD-10-CM

## 2013-01-09 ENCOUNTER — Ambulatory Visit (INDEPENDENT_AMBULATORY_CARE_PROVIDER_SITE_OTHER): Payer: Medicaid Other | Admitting: General Surgery

## 2013-01-09 ENCOUNTER — Encounter (INDEPENDENT_AMBULATORY_CARE_PROVIDER_SITE_OTHER): Payer: Self-pay | Admitting: General Surgery

## 2013-01-09 VITALS — BP 138/82 | HR 92 | Temp 97.2°F | Resp 24 | Ht 65.0 in | Wt 253.0 lb

## 2013-01-09 DIAGNOSIS — N611 Abscess of the breast and nipple: Secondary | ICD-10-CM

## 2013-01-09 DIAGNOSIS — N61 Mastitis without abscess: Secondary | ICD-10-CM

## 2013-01-09 NOTE — Progress Notes (Signed)
Subjective:     Patient ID: [name], female   DOB: 1966-05-27, 47 y.o.   MRN: [id number]  HPI  She is here for a followup visit after drainage of her right breast abscess. She states this area is much better. She continues to have some problems with the chronic recurring area in the inferior aspect of the right medial breast region.   Review of Systems     Objective:   Physical Exam Right breast-lateral wound is almost completely healed. No evidence of recurrent infection or induration. Medially, in the right inframammary fold areas there are 3 small open wounds with a small amount of serous drainage consistent with hidradenitis.    Assessment:     1. Right breast abscess-has responded well to incision and drainage and oral antibiotics.  2. Recurrent right inframammary hidradenitis     Plan:     Clean right inframammary wounds with Hibiclens for one week.  If this process is does not clear up and she may need a wide excision of the area with healing by secondary intention we discussed that. We'll see her back on a when necessary basis.

## 2013-01-09 NOTE — Patient Instructions (Signed)
Buy Hibiclens soap and clean the area daily for one week.

## 2013-01-10 ENCOUNTER — Other Ambulatory Visit: Payer: Self-pay | Admitting: Internal Medicine

## 2013-01-10 ENCOUNTER — Other Ambulatory Visit (INDEPENDENT_AMBULATORY_CARE_PROVIDER_SITE_OTHER): Payer: Medicaid Other

## 2013-01-10 DIAGNOSIS — B20 Human immunodeficiency virus [HIV] disease: Secondary | ICD-10-CM

## 2013-01-10 LAB — COMPREHENSIVE METABOLIC PANEL
ALT: 70 U/L — ABNORMAL HIGH (ref 0–35)
AST: 92 U/L — ABNORMAL HIGH (ref 0–37)
Alkaline Phosphatase: 164 U/L — ABNORMAL HIGH (ref 39–117)
Sodium: 136 mEq/L (ref 135–145)
Total Bilirubin: 0.2 mg/dL — ABNORMAL LOW (ref 0.3–1.2)
Total Protein: 7.4 g/dL (ref 6.0–8.3)

## 2013-01-10 LAB — LIPID PANEL
LDL Cholesterol: 106 mg/dL — ABNORMAL HIGH (ref 0–99)
Triglycerides: 91 mg/dL (ref <150)
VLDL: 18 mg/dL (ref 0–40)

## 2013-01-10 LAB — CBC
MCH: 23.8 pg — ABNORMAL LOW (ref 26.0–34.0)
MCHC: 31.6 g/dL (ref 30.0–36.0)
Platelets: 423 10*3/uL — ABNORMAL HIGH (ref 150–400)

## 2013-01-11 LAB — T-HELPER CELL (CD4) - (RCID CLINIC ONLY)
CD4 % Helper T Cell: 31 % — ABNORMAL LOW (ref 33–55)
CD4 T Cell Abs: 860 uL (ref 400–2700)

## 2013-01-11 LAB — RPR

## 2013-01-13 LAB — HIV-1 RNA QUANT-NO REFLEX-BLD
HIV 1 RNA Quant: 33 copies/mL — ABNORMAL HIGH (ref <20)
HIV-1 RNA Quant, Log: 1.52 {Log} — ABNORMAL HIGH (ref <1.30)

## 2013-01-18 ENCOUNTER — Other Ambulatory Visit (INDEPENDENT_AMBULATORY_CARE_PROVIDER_SITE_OTHER): Payer: Self-pay | Admitting: General Surgery

## 2013-01-24 ENCOUNTER — Ambulatory Visit (INDEPENDENT_AMBULATORY_CARE_PROVIDER_SITE_OTHER): Payer: Medicaid Other | Admitting: Internal Medicine

## 2013-01-24 ENCOUNTER — Encounter: Payer: Self-pay | Admitting: Internal Medicine

## 2013-01-24 VITALS — BP 172/105 | HR 89 | Temp 97.3°F | Ht 65.0 in | Wt 253.5 lb

## 2013-01-24 DIAGNOSIS — B20 Human immunodeficiency virus [HIV] disease: Secondary | ICD-10-CM

## 2013-01-24 NOTE — Progress Notes (Signed)
Patient ID: [name], female   DOB: 12-06-1965, 47 y.o.   MRN: [id number]          [facility] for Infectious Disease  Patient Active Problem List  Diagnosis  . HIV DISEASE  . SARCOIDOSIS  . THYROID NODULE, RIGHT  . GRAVE'S DISEASE  . TOBACCO USER  . DEPRESSION  . CLASS 1-EXOPHTHALMOS-THYROTOXIC  . HYPERTENSION  . ASTHMA  . GERD  . Postsurgical hypothyroidism  . 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 Vaginal Discharge  . Rash  . Abscess  . Breast abscess of female-right  . Abscess of breast, right    Patient's Medications  New Prescriptions   No medications on file  Previous Medications   ALBUTEROL (PROVENTIL HFA;VENTOLIN HFA) 108 (90 BASE) MCG/ACT INHALER    Inhale 2 puffs into the lungs 4 (four) times daily as needed. For shortness of breath   ARTIFICIAL TEARS OPHTHALMIC SOLUTION    Place 2 drops into both eyes every 4 (four) hours as needed. For dry eyes   ATRIPLA 600-200-300 MG PER TABLET    take 1 tablet by mouth at bedtime   HYDROCHLOROTHIAZIDE (MICROZIDE) 12.5 MG CAPSULE    Take 12.5 mg by mouth every morning.   LEVOTHYROXINE (SYNTHROID, LEVOTHROID) 200 MCG TABLET    Take 200 mcg by mouth daily.   NIFEDIPINE (PROCARDIA XL/ADALAT-CC) 60 MG 24 HR TABLET    Take 60 mg by mouth every morning.   Modified Medications   No medications on file  Discontinued Medications   DOXYCYCLINE (VIBRA-TABS) 100 MG TABLET    Take 1 tablet (100 mg total) by mouth 2 (two) times daily.   HYDROCODONE-ACETAMINOPHEN (NORCO) 5-325 MG PER TABLET    Take 1-2 tablets by mouth every 6 (six) hours as needed for pain.   SENNOSIDES-DOCUSATE SODIUM (SENOKOT-S) 8.6-50 MG TABLET    Take 1 tablet by mouth 2 (two) times daily. While taking pain meds.    Subjective: [name] is in for her routine visit. He has gone through multiple surgeries for a large staghorn calculus and also recurrent right breast abscess in the last few months. Overall she is feeling better but she has had  upper respiratory infection with sinus congestion over the past 2 weeks. She has no cough or no shortness of breath. She thinks she's only missed one dose of her Atripla since her last visit.  Objective: Temp: 97.3 F (36.3 C) (04/10 0959) Temp src: Oral (04/10 0959) BP: 172/105 mmHg (04/10 0959) Pulse Rate: 89 (04/10 0959)  General: Her weight is 255 pounds with a body mass index over 42 Skin: no rash Lungs: Clear Cor: Regular S1 and S2 no murmurs Abdomen: Obese, soft and nontender  Lab Results HIV 1 RNA Quant (copies/mL)  Date Value  01/10/2013 33*  02/09/2012 <20   07/25/2011 <20      CD4 T Cell Abs (cmm)  Date Value  01/10/2013 860   02/09/2012 1040   07/25/2011 790    Lab Results  Component Value Date   WBC 6.5 01/10/2013   HGB 10.3* 01/10/2013   HCT 32.6* 01/10/2013   MCV 75.3* 01/10/2013   PLT 423* 01/10/2013   Lab Results  Component Value Date   CREATININE 0.93 01/10/2013   BUN 14 01/10/2013   NA 136 01/10/2013   K 3.9 01/10/2013   CL 102 01/10/2013   CO2 26 01/10/2013   Lab Results  Component Value Date   ALT 70* 01/10/2013   AST 92* 01/10/2013  ALKPHOS 164* 01/10/2013   BILITOT 0.2* 01/10/2013   Lab Results  Component Value Date   CHOL 170 01/10/2013   HDL 46 01/10/2013   LDLCALC 106* 01/10/2013   TRIG 91 01/10/2013   CHOLHDL 3.7 01/10/2013     Assessment: Her HIV infection remains under reasonably good control. I will continue Atripla. She is having a great deal of difficulty with her weight, blood pressure, blood sugar and liver enzymes. Suspect that she may have some nonalcoholic fatty liver. She probably also has some menstrual blood loss related iron deficiency anemia. We will try to help her establish primary care. I do not think she needs to continue doxycycline long-term.  Plan: 1. Continue Atripla 2. Discontinue doxycycline 3. Establish primary care 4. Follow up here after repeat CD4 count and viral load in 3 months   Michel Bickers, MD [facility] for [place] [phone] pager   [phone] cell 01/24/2013, 10:21 AM

## 2013-01-28 ENCOUNTER — Other Ambulatory Visit (HOSPITAL_COMMUNITY)
Admission: RE | Admit: 2013-01-28 | Discharge: 2013-01-28 | Disposition: A | Discharge status: Home / Self Care | Payer: Medicaid Other | Source: Ambulatory Visit | Attending: Internal Medicine | Admitting: Internal Medicine

## 2013-01-28 ENCOUNTER — Ambulatory Visit (INDEPENDENT_AMBULATORY_CARE_PROVIDER_SITE_OTHER): Payer: Medicaid Other | Admitting: *Deleted

## 2013-01-28 DIAGNOSIS — Z1231 Encounter for screening mammogram for malignant neoplasm of breast: Secondary | ICD-10-CM

## 2013-01-28 DIAGNOSIS — Z01419 Encounter for gynecological examination (general) (routine) without abnormal findings: Secondary | ICD-10-CM | POA: Insufficient documentation

## 2013-01-28 DIAGNOSIS — Z0000 Encounter for general adult medical examination without abnormal findings: Secondary | ICD-10-CM

## 2013-01-28 DIAGNOSIS — Z124 Encounter for screening for malignant neoplasm of cervix: Secondary | ICD-10-CM

## 2013-01-28 DIAGNOSIS — Z113 Encounter for screening for infections with a predominantly sexual mode of transmission: Secondary | ICD-10-CM | POA: Insufficient documentation

## 2013-01-28 NOTE — Patient Instructions (Signed)
Your results will be ready in about a week.  Your may look them up on MyChart.  Thank you for coming to the Center for your care.  Langley Gauss, RN

## 2013-01-28 NOTE — Progress Notes (Signed)
  Subjective:     [name] is a 47 y.o. woman who comes in today for a  pap smear only.  Previous abnormal Pap smears: no. Contraception: condoms   Objective:    LMP 01/23/2013 Pelvic Exam:  Pap smear obtained.   Assessment:    Screening pap smear.   Plan:    Follow up in one year, or as indicated by Pap results.  Pt given condoms. Pt given educational materials re: HIV and aging, heart disease, nutrition, diet, and PAP smears.

## 2013-02-11 ENCOUNTER — Encounter: Payer: Self-pay | Admitting: *Deleted

## 2013-02-25 ENCOUNTER — Ambulatory Visit: Payer: Medicaid Other

## 2013-02-28 ENCOUNTER — Telehealth: Payer: Self-pay | Admitting: *Deleted

## 2013-02-28 NOTE — Telephone Encounter (Signed)
[place], pt needs to switch Medicaid to [facility].  Pt advised to go to DSS on [address] to switch [employer] provider.  New Medicaid card will be issued the beginning of the next month, June.  Pt to call [facility] after she receives new card.  Referral information will be faxed to [facility] at that time.  Pt verbalized understanding.

## 2013-03-03 ENCOUNTER — Other Ambulatory Visit: Payer: Self-pay | Admitting: Infectious Disease

## 2013-03-18 ENCOUNTER — Ambulatory Visit: Payer: Self-pay

## 2013-04-02 ENCOUNTER — Telehealth: Payer: Self-pay | Admitting: *Deleted

## 2013-04-02 NOTE — Telephone Encounter (Signed)
Patient called to let Dr. [name] know that she made an appointment for a PCP, but they cannot see her until mid August.  In the meantime, she feels she is having a gout flare.  RN returned her call to offer her an appointment this week, left a message for her to call us back to schedule it. Landis Gandy, RN

## 2013-04-03 ENCOUNTER — Encounter: Payer: Self-pay | Admitting: Internal Medicine

## 2013-04-03 ENCOUNTER — Ambulatory Visit (INDEPENDENT_AMBULATORY_CARE_PROVIDER_SITE_OTHER): Payer: Medicaid Other | Admitting: Internal Medicine

## 2013-04-03 ENCOUNTER — Telehealth: Payer: Self-pay | Admitting: *Deleted

## 2013-04-03 VITALS — BP 162/88 | HR 79 | Temp 98.1°F | Ht 65.0 in | Wt 257.0 lb

## 2013-04-03 DIAGNOSIS — M7989 Other specified soft tissue disorders: Secondary | ICD-10-CM | POA: Insufficient documentation

## 2013-04-03 DIAGNOSIS — E890 Postprocedural hypothyroidism: Secondary | ICD-10-CM

## 2013-04-03 DIAGNOSIS — B20 Human immunodeficiency virus [HIV] disease: Secondary | ICD-10-CM

## 2013-04-03 LAB — CBC
MCH: 24.6 pg — ABNORMAL LOW (ref 26.0–34.0)
MCV: 76.1 fL — ABNORMAL LOW (ref 78.0–100.0)
Platelets: 443 10*3/uL — ABNORMAL HIGH (ref 150–400)
RDW: 19.2 % — ABNORMAL HIGH (ref 11.5–15.5)

## 2013-04-03 NOTE — Telephone Encounter (Signed)
Pt c/o "gout."   Pt unable to see PCP until Aug 2014. Left message to call [facility] for appt.

## 2013-04-03 NOTE — Progress Notes (Signed)
Patient ID: [name], female   DOB: [birth year], 47 y.o.   MRN: [id number]          [facility]  Patient Active Problem List   Diagnosis Date Noted  . HIV DISEASE 07/24/2006    Priority: High  . Hidradenitis suppurativa of right >> left axilla 01/30/2012    Priority: Medium  . 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 GRAVE'S DISEASE 01/01/2008    Priority: Medium  . TOBACCO USER 02/08/2007    Priority: Medium  . HYPERTENSION 07/24/2006    Priority: Medium  . ASTHMA 07/24/2006    Priority: Medium  . Leg swelling 04/03/2013  . Abscess of breast, right 01/09/2013  . Breast abscess of female-right 12/18/2012  . Rash 02/16/2012  . Abscess 02/16/2012  . Vaginal Discharge 02/09/2012  . SARCOIDOSIS 02/08/2007  . THYROID NODULE, RIGHT 02/08/2007  . 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 DEPRESSION 07/24/2006  . GERD 07/24/2006    Patient's Medications  New Prescriptions   No medications on file  Previous Medications   ALBUTEROL (PROVENTIL HFA;VENTOLIN HFA) 108 (90 BASE) MCG/ACT INHALER    Inhale 2 puffs into the lungs 4 (four) times daily as needed. For shortness of breath   ARTIFICIAL TEARS OPHTHALMIC SOLUTION    Place 2 drops into both eyes every 4 (four) hours as needed. For dry eyes   ATRIPLA 600-200-300 MG PER TABLET    take 1 tablet by mouth at bedtime   CETIRIZINE (ZYRTEC) 10 MG TABLET    TAKE 1 TABLET (10 MG TOTAL) BY MOUTH DAILY.   HYDROCHLOROTHIAZIDE (MICROZIDE) 12.5 MG CAPSULE    Take 12.5 mg by mouth every morning.   IBUPROFEN (ADVIL,MOTRIN) 200 MG TABLET    Take 800 mg by mouth as needed for pain.   LEVOTHYROXINE (SYNTHROID, LEVOTHROID) 200 MCG TABLET    Take 200 mcg by mouth daily.   NIFEDIPINE (PROCARDIA XL/ADALAT-CC) 60 MG 24 HR TABLET    Take 60 mg by mouth every morning.   Modified Medications   No medications on file  Discontinued Medications   No medications on file    Subjective: [name] is  seen on a work in basis. About 3-4 days ago she began to notice bilateral swelling of her feet and lower legs. The swelling tends to get worse throughout the course of the day and gets better when she elevates her feet her after she's been in bed all night is associated with discomfort over the dorsum of her feet. He is more noticeable in her left foot. She's not had any injuries to her feet. She's not had any fever, chills or sweats. One of her friends told her that she probably had gout. She's taken Motrin on 4 occasions with some relief but is worried about taking it because she was told that it could cause blood in her urine. She has not missed any doses of her medications. Unfortunately she did resume smoking cigarettes.  Review of Systems: Constitutional: positive for malaise, negative for anorexia, chills, fevers, sweats and weight loss Eyes: negative Ears, nose, mouth, throat, and face: negative Respiratory: negative for cough, dyspnea on exertion and pleurisy/chest pain Cardiovascular: positive for palpitations, negative for chest pain, chest pressure/discomfort, dyspnea, orthopnea and paroxysmal nocturnal dyspnea Gastrointestinal: negative Genitourinary:negative  Past Medical History  Diagnosis Date  . Hyperthyroidism 08/2006    Grave's Disease -diffuse radiotracer uptake 08/25/06 Thyroid scan-Cold nodule to R lower lobe of thyrorid  . HIV DISEASE 07/24/2006  dx March 05  . Sarcoidosis 02/08/2007    dx as a teenager in [place] from abnl CXR. Completed 2 yrs Prednisone after lung bx confirmation. No symptoms since then.  . THYROID NODULE, RIGHT 02/08/2007  . GRAVE'S DISEASE 01/01/2008  . 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 HYPERTENSION 07/24/2006  . ASTHMA 07/24/2006  . GERD 07/24/2006  . 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 Anemia     Normocytic  . History of hidradenitis suppurativa   . Gastroenteritis 07/10/07  . Menometrorrhagia     hx of  . History of thrush     . Bronchitis 2005  . Pneumonia 2005  . Nephrolithiasis     History  Substance Use Topics  . Smoking status: Current Every Day Smoker -- 0.25 packs/day for 15 years    Types: Cigarettes  . Smokeless tobacco: Never Used     Comment: Would like the patch  . Alcohol Use: No    Family History  Problem Relation Age of Onset  . Cancer Other     FH of Breast Cancer  . Cancer Other     Fh of Lung Cancer  . Heart disease Other   . Hypertension Other   . Stroke Other     Grandparent  . Kidney disease Other     Grandparent  . Diabetes Other     FH of Diabetes  . Hypertension Mother   . Cancer Mother     throat  . Hypertension Father   . Hypertension Sister   . Cancer Maternal Uncle     Stomach Cancer    Allergies  Allergen Reactions  . Tape Rash    Rash   . Lisinopril Cough  . Xylocaine (Lidocaine)     Unknown , patient states she has had lidocaine for IV starts and at the dentist with no problems    Objective: Temp: 98.1 F (36.7 C) (06/18 1457) Temp src: Oral (06/18 1457) BP: 162/88 mmHg (06/18 1457) Pulse Rate: 79 (06/18 1457)  General: Her weight is up 3 pounds to 257. Her body mass index is over 42 Oral: No oropharyngeal lesions Skin: No rash Lungs: Clear Cor: Regular S1 and S2 no murmurs Abdomen: Obese, soft and nontender without palpable masses Joints and extremities: Joints are normal. She has 1+ pitting edema of her lower legs slightly worse on the left and right Neuro: Alert and fully oriented with normal conversation Mood and affect: Normal  Lab Results HIV 1 RNA Quant (copies/mL)  Date Value  01/10/2013 33*  02/09/2012 <20   07/25/2011 <20      CD4 T Cell Abs (cmm)  Date Value  01/10/2013 860   02/09/2012 1040   07/25/2011 790      Lab Results  Component Value Date   WBC 6.5 01/10/2013   HGB 10.3* 01/10/2013   HCT 32.6* 01/10/2013   MCV 75.3* 01/10/2013   PLT 423* 01/10/2013   BMET    Component Value Date/Time   NA 136 01/10/2013 1040    K 3.9 01/10/2013 1040   CL 102 01/10/2013 1040   CO2 26 01/10/2013 1040   GLUCOSE 106* 01/10/2013 1040   BUN 14 01/10/2013 1040   CREATININE 0.93 01/10/2013 1040   CREATININE 0.93 11/09/2012 0720   CALCIUM 8.8 01/10/2013 1040   GFRNONAA 74* 11/09/2012 0720   GFRAA 85* 11/09/2012 0720   Lab Results  Component Value Date   ALT 70* 01/10/2013   AST 92* 01/10/2013   ALKPHOS 164* 01/10/2013   BILITOT  0.2* 01/10/2013   Lab Results  Component Value Date   CHOL 170 01/10/2013   HDL 46 01/10/2013   LDLCALC 106* 01/10/2013   TRIG 91 01/10/2013   CHOLHDL 3.7 01/10/2013   Assessment: She's developed mild lower extremity edema associated with soft tissue discomfort. The cause is not absolutely clear but could be related to her elevated blood pressure, obesity or unrecognized cardiopulmonary, liver or kidney disease. I will have her continue current medications for now and will check blood work and urine. I encouraged her to see a nutritionist but she is having difficulty taking time off of work. She's planning on seeing a new primary care doctor within the next few weeks. Is not getting any regular exercise. Her father has a blood pressure monitor at home and I encouraged her to check her blood pressure daily. I also asked her to call the [place] quit line to develop a plan so that she could try to quit smoking cigarettes again. I suggested that she go to [employer] and get some knee-high compressive stockings and put him on the beginning of each day.  Plan: 1. Continue current medications 2. Check CBC, complete metabolic panel, CD4 count, viral load, TSH and urinalysis 3. Cigarette cessation counseling provided 4. Lifestyle modification counseling provided 5. Outpatient blood pressure monitoring 6. Compressive stockings 7. Followup in one week   Michel Bickers, MD [facility] [place] [phone] pager   [phone] cell 04/03/2013, 3:49 PM

## 2013-04-04 LAB — URINALYSIS
Bilirubin Urine: NEGATIVE
Nitrite: NEGATIVE

## 2013-04-04 LAB — T-HELPER CELL (CD4) - (RCID CLINIC ONLY): CD4 % Helper T Cell: 36 % (ref 33–55)

## 2013-04-04 LAB — COMPREHENSIVE METABOLIC PANEL
ALT: 73 U/L — ABNORMAL HIGH (ref 0–35)
AST: 90 U/L — ABNORMAL HIGH (ref 0–37)
Creat: 0.82 mg/dL (ref 0.50–1.10)
Total Bilirubin: 0.3 mg/dL (ref 0.3–1.2)

## 2013-04-05 LAB — HIV-1 RNA QUANT-NO REFLEX-BLD
HIV 1 RNA Quant: <20 copies/mL (ref <20)
HIV-1 RNA Quant, Log: <1.3 {Log} (ref <1.30)

## 2013-04-16 ENCOUNTER — Other Ambulatory Visit: Payer: Medicaid Other

## 2013-04-17 ENCOUNTER — Other Ambulatory Visit: Payer: Self-pay | Admitting: Internal Medicine

## 2013-04-18 ENCOUNTER — Other Ambulatory Visit: Payer: Self-pay | Admitting: *Deleted

## 2013-04-18 DIAGNOSIS — I10 Essential (primary) hypertension: Secondary | ICD-10-CM

## 2013-04-18 MED ORDER — NIFEDIPINE ER OSMOTIC RELEASE 60 MG PO TB24: 60.0000 mg | ORAL_TABLET | Freq: Every day | ORAL | Status: DC

## 2013-04-19 ENCOUNTER — Encounter (HOSPITAL_COMMUNITY): Payer: Self-pay

## 2013-04-19 ENCOUNTER — Emergency Department (HOSPITAL_COMMUNITY)
Admission: EM | Admit: 2013-04-19 | Discharge: 2013-04-19 | Disposition: A | Discharge status: Home / Self Care | Payer: Medicaid Other | Attending: Emergency Medicine | Admitting: Emergency Medicine

## 2013-04-19 DIAGNOSIS — Z8709 Personal history of other diseases of the respiratory system: Secondary | ICD-10-CM | POA: Insufficient documentation

## 2013-04-19 DIAGNOSIS — I10 Essential (primary) hypertension: Secondary | ICD-10-CM | POA: Insufficient documentation

## 2013-04-19 DIAGNOSIS — F17200 Nicotine dependence, unspecified, uncomplicated: Secondary | ICD-10-CM | POA: Insufficient documentation

## 2013-04-19 DIAGNOSIS — Z8619 Personal history of other infectious and parasitic diseases: Secondary | ICD-10-CM | POA: Insufficient documentation

## 2013-04-19 DIAGNOSIS — Z8639 Personal history of other endocrine, nutritional and metabolic disease: Secondary | ICD-10-CM | POA: Insufficient documentation

## 2013-04-19 DIAGNOSIS — Z8719 Personal history of other diseases of the digestive system: Secondary | ICD-10-CM | POA: Insufficient documentation

## 2013-04-19 DIAGNOSIS — L732 Hidradenitis suppurativa: Secondary | ICD-10-CM | POA: Insufficient documentation

## 2013-04-19 DIAGNOSIS — Z8742 Personal history of other diseases of the female genital tract: Secondary | ICD-10-CM | POA: Insufficient documentation

## 2013-04-19 DIAGNOSIS — J45909 Unspecified asthma, uncomplicated: Secondary | ICD-10-CM | POA: Insufficient documentation

## 2013-04-19 DIAGNOSIS — E0590 Thyrotoxicosis, unspecified without thyrotoxic crisis or storm: Secondary | ICD-10-CM | POA: Insufficient documentation

## 2013-04-19 DIAGNOSIS — Z862 Personal history of diseases of the blood and blood-forming organs and certain disorders involving the immune mechanism: Secondary | ICD-10-CM | POA: Insufficient documentation

## 2013-04-19 DIAGNOSIS — Z8701 Personal history of pneumonia (recurrent): Secondary | ICD-10-CM | POA: Insufficient documentation

## 2013-04-19 DIAGNOSIS — Z87448 Personal history of other diseases of urinary system: Secondary | ICD-10-CM | POA: Insufficient documentation

## 2013-04-19 DIAGNOSIS — Z8669 Personal history of other diseases of the nervous system and sense organs: Secondary | ICD-10-CM | POA: Insufficient documentation

## 2013-04-19 DIAGNOSIS — Z79899 Other long term (current) drug therapy: Secondary | ICD-10-CM | POA: Insufficient documentation

## 2013-04-19 DIAGNOSIS — N61 Mastitis without abscess: Secondary | ICD-10-CM | POA: Insufficient documentation

## 2013-04-19 DIAGNOSIS — N611 Abscess of the breast and nipple: Secondary | ICD-10-CM

## 2013-04-19 DIAGNOSIS — R21 Rash and other nonspecific skin eruption: Secondary | ICD-10-CM | POA: Insufficient documentation

## 2013-04-19 DIAGNOSIS — E890 Postprocedural hypothyroidism: Secondary | ICD-10-CM | POA: Insufficient documentation

## 2013-04-19 DIAGNOSIS — B20 Human immunodeficiency virus [HIV] disease: Secondary | ICD-10-CM | POA: Insufficient documentation

## 2013-04-19 MED ORDER — OXYCODONE-ACETAMINOPHEN 5-325 MG PO TABS: 2.0000 | ORAL_TABLET | ORAL | Status: DC | PRN

## 2013-04-19 MED ORDER — CLINDAMYCIN HCL 150 MG PO CAPS
150.0000 mg | ORAL_CAPSULE | Freq: Four times a day (QID) | ORAL | Status: DC
Start: 2013-04-19 — End: 2013-04-30

## 2013-04-19 MED ORDER — OXYCODONE-ACETAMINOPHEN 5-325 MG PO TABS
1.0000 | ORAL_TABLET | Freq: Once | ORAL | Status: DC
  Filled 2013-04-19: qty 1

## 2013-04-19 MED ORDER — CLINDAMYCIN HCL 300 MG PO CAPS
300.0000 mg | ORAL_CAPSULE | Freq: Once | ORAL | Status: AC
  Administered 2013-04-19: 300 mg via ORAL
  Filled 2013-04-19: qty 1

## 2013-04-19 NOTE — ED Notes (Signed)
[id number] Expected date:<BR> Expected time:<BR> Means of arrival:<BR> Comments:<BR> hall

## 2013-04-19 NOTE — ED Notes (Signed)
[id number] Expected date:<BR> Expected time:<BR> Means of arrival:<BR> Comments:<BR> Triage 9

## 2013-04-19 NOTE — ED Notes (Signed)
Patient with abscess under right arm.  Patient rates pain as a 10.  Patient using hot compresses at home and Boil Ease.  Patient unable to move right upper extremity without extreme pain.  Patient reports she usually has to have surgery for these abscess.

## 2013-04-19 NOTE — ED Provider Notes (Signed)
History    CSN: [id number] Arrival date & time 04/19/13  1705  First MD Initiated Contact with Patient 04/19/13 1833     Chief Complaint  Patient presents with  . Abscess   (Consider location/radiation/quality/duration/timing/severity/associated sxs/prior Treatment) HPI  47 year old female with history of HIV, history of hidradenitis suppurativa, presents for evaluations of boils under R arm pit and right breast. Abscess were noted 4 days ago. It became painful since last night. Patient reports pain as a sharp sensation worsening with movement and worsening with palpation. Pain is 10/10. She also noticed redness to the skin and warmth to the skin. Abscess is located in the same location of her prior abscesses that usually require surgical contrast and drainage by central Rhea surgery. She has been using BoilEZ and warm compress with minimal relief. She denies fever, chills, nausea, vomiting, shortness of breath. States her HIV is well-controlled on medication with undetectable viral load last checked a month ago.   Past Medical History  Diagnosis Date  . Hyperthyroidism 08/2006    Grave's Disease -diffuse radiotracer uptake 08/25/06 Thyroid scan-Cold nodule to R lower lobe of thyrorid  . HIV DISEASE 07/24/2006    dx March 05  . Sarcoidosis 02/08/2007    dx as a teenager in [place] from abnl CXR. Completed 2 yrs Prednisone after lung bx confirmation. No symptoms since then.  . THYROID NODULE, RIGHT 02/08/2007  . GRAVE'S DISEASE 01/01/2008  . 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 HYPERTENSION 07/24/2006  . ASTHMA 07/24/2006  . GERD 07/24/2006  . 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 Anemia     Normocytic  . History of hidradenitis suppurativa   . Gastroenteritis 07/10/07  . Menometrorrhagia     hx of  . History of thrush   . Bronchitis 2005  . Pneumonia 2005  . Nephrolithiasis    Past Surgical History  Procedure Laterality Date  . Total thyroidectomy  2010  . [place]   1994  . Dilation and curettage of uterus  Feb 2004    s/p for 1st trimester nonviable pregnancy  . Incision and drainage peritonsillar abscess  Mar 03  . Incise and drain abcess  Nov 03    s/p I &D for righ inframmary fold hidradenitis  . Irrigation and debridement abscess  01/31/2012    Procedure: IRRIGATION AND DEBRIDEMENT ABSCESS;  Surgeon: Shann Medal, MD;  Location: [facility];  Service: General;  Laterality: Right;  right breast and axilla   . Eye surgery      sty under eyelid  . Nephrolithotomy  10/02/2012    Procedure: NEPHROLITHOTOMY PERCUTANEOUS;  Surgeon: Alexis Frock, MD;  Location: [facility];  Service: Urology;  Laterality: Right;  First Stage Percutaneous Nephrolithotomy with Surgeon Access, Left Ureteral Stent    . Cystoscopy with stent placement  10/02/2012    Procedure: CYSTOSCOPY WITH STENT PLACEMENT;  Surgeon: Alexis Frock, MD;  Location: [facility];  Service: Urology;  Laterality: Left;  . Nephrolithotomy  10/04/2012    Procedure: NEPHROLITHOTOMY PERCUTANEOUS SECOND LOOK;  Surgeon: Alexis Frock, MD;  Location: [facility];  Service: Urology;  Laterality: Right;     . Nephrolithotomy  10/08/2012    Procedure: NEPHROLITHOTOMY PERCUTANEOUS;  Surgeon: Alexis Frock, MD;  Location: [facility];  Service: Urology;  Laterality: Right;  THIRD STAGE, nephrostomy tube exchange x 2  . Nephrolithotomy  10/11/2012    Procedure: NEPHROLITHOTOMY PERCUTANEOUS SECOND LOOK;  Surgeon: Alexis Frock, MD;  Location: [facility];  Service: Urology;  Laterality: Right;  RIGHT 4 STAGE  PERCUTANOUS NEPHROLITHOTOMY, right URETEROSCOPY WITH HOLMIUM LASER   . Cystoscopy with retrograde pyelogram, ureteroscopy and stent placement  11/09/2012    Procedure: [place], URETEROSCOPY AND STENT PLACEMENT;  Surgeon: Alexis Frock, MD;  Location: [facility];  Service: Urology;  Laterality: Left;  LEFT URETEROSCOPY, STONE MANIPULATION, left STENT exchange   . Cystoscopy w/ ureteral stent removal   11/09/2012    Procedure: CYSTOSCOPY WITH STENT REMOVAL;  Surgeon: Alexis Frock, MD;  Location: [facility];  Service: Urology;  Laterality: Right;  . Breast surgery  1997    Breast Reduction    Family History  Problem Relation Age of Onset  . Cancer Other     FH of Breast Cancer  . Cancer Other     Fh of Lung Cancer  . Heart disease Other   . Hypertension Other   . Stroke Other     Grandparent  . Kidney disease Other     Grandparent  . Diabetes Other     FH of Diabetes  . Hypertension Mother   . Cancer Mother     throat  . Hypertension Father   . Hypertension Sister   . Cancer Maternal Uncle     Stomach Cancer   History  Substance Use Topics  . Smoking status: Current Some Day Smoker -- 0.25 packs/day for 15 years    Types: Cigarettes  . Smokeless tobacco: Never Used     Comment: Would like the patch  . Alcohol Use: No   OB History   Grav Para Term Preterm Abortions TAB SAB Ect Mult Living                 Review of Systems  Constitutional: Negative for fever.  Skin: Positive for rash (abscess and celllutitis to R breasts).  Neurological: Negative for numbness.    Allergies  Tape; Lisinopril; and Xylocaine  Home Medications   Current Outpatient Rx  Name  Route  Sig  Dispense  Refill  . albuterol (PROVENTIL HFA;VENTOLIN HFA) 108 (90 BASE) MCG/ACT inhaler   Inhalation   Inhale 2 puffs into the lungs 4 (four) times daily as needed. For shortness of breath         . ARTIFICIAL TEARS ophthalmic solution   Both Eyes   Place 2 drops into both eyes every 4 (four) hours as needed. For dry eyes         . ATRIPLA 600-200-300 MG per tablet      take 1 tablet by mouth at bedtime   30 tablet   6   . cetirizine (ZYRTEC) 10 MG tablet   Oral   Take 10 mg by mouth daily.         . hydrochlorothiazide (MICROZIDE) 12.5 MG capsule   Oral   Take 12.5 mg by mouth every morning.         Marland Kitchen levothyroxine (SYNTHROID, LEVOTHROID) 200 MCG tablet   Oral   Take 200  mcg by mouth daily.         Marland Kitchen NIFEdipine (PROCARDIA XL/ADALAT-CC) 60 MG 24 hr tablet      TAKE 1 TABLET (60 MG TOTAL) BY MOUTH DAILY.   30 tablet   7    BP 157/79  Pulse 79  Temp(Src) 99.7 F (37.6 C) (Oral)  Resp 18  Ht 5\' 5"  (1.651 m)  Wt 235 lb (106.595 kg)  BMI 39.11 kg/m2  SpO2 97%  LMP 03/17/2013 Physical Exam  Nursing note and vitals reviewed. Constitutional: She is  oriented to person, place, and time. She appears well-developed and well-nourished. No distress.  HENT:  Head: Atraumatic.  Eyes: Conjunctivae are normal.  Cardiovascular: Normal rate and regular rhythm.   Pulmonary/Chest: No respiratory distress. She has no wheezes.  Neurological: She is alert and oriented to person, place, and time.  Skin:  Several boils noted to R arm pit, pea-size, not amenable to drainage.  Breast exam with chaperone: R breast with an area of induration and fluctuance to R lateral breast with scar tissues on top.  A 3x4inches area of erythema surrounding the abscess. Exquisitely tender on palpation.        ED Course  Procedures (including critical care time)  Pt with hidradenitis Suppurativa with abscesses to R arm pit not amenable to I&D.  She also has an abscess on her R breast with surrounding cellulitis.  Area of cellulitis was marked and dated.  I explained to pt that breast abscess and hidradenitis abscess will be best manage by [facility] as it has in the past.  I did offer to I&D her breast abscess but pt felt more comfortable to have it addressed by [facility].  She is afebrile.  Will give pain medication and clindamycin as treatment.  Pt to continue warm compress and have close f/u with [facility].  Return precaution discussed.    7:53 PM Pt did request for I&D.  Successful I&D by me.  Pt told to have close f/u with [facility] for further care.    INCISION AND DRAINAGE Performed by: Domenic Moras Consent: Verbal consent obtained. Risks and benefits: risks, benefits and alternatives were  discussed Type: abscess  Body area: R cutaneous breast  Anesthesia: local infiltration  Incision was made with a scalpel.  Local anesthetic: lidocaine 2% 2 epinephrine  Anesthetic total: 6 ml  Complexity: complex Blunt dissection to break up loculations  Drainage: purulent  Drainage amount: moderate  Packing material: 1/4 in iodoform gauze  Patient tolerance: Patient tolerated the procedure well with no immediate complications.     Labs Reviewed - No data to display No results found. 1. Breast abscess of female-right   2. Hidradenitis suppurativa of right axilla   3. Cellulitis of female breast     MDM  BP 157/79  Pulse 79  Temp(Src) 99.7 F (37.6 C) (Oral)  Resp 18  Ht 5\' 5"  (1.651 m)  Wt 235 lb (106.595 kg)  BMI 39.11 kg/m2  SpO2 97%  LMP 03/17/2013    Domenic Moras, PA-C 04/19/13 2000

## 2013-04-19 NOTE — ED Provider Notes (Signed)
History/physical exam/procedure(s) were performed by non-physician practitioner and as supervising physician I was immediately available for consultation/collaboration. I have reviewed all notes and am in agreement with care and plan.   Shaune Pollack, MD 04/19/13 2234

## 2013-04-19 NOTE — ED Notes (Signed)
Pt states she noticed a bump under RT axilla/side of breast 2 days ago that has gotten worse.

## 2013-04-30 ENCOUNTER — Ambulatory Visit (INDEPENDENT_AMBULATORY_CARE_PROVIDER_SITE_OTHER): Payer: Medicaid Other | Admitting: Internal Medicine

## 2013-04-30 ENCOUNTER — Encounter: Payer: Self-pay | Admitting: Internal Medicine

## 2013-04-30 VITALS — BP 152/87 | HR 93 | Temp 98.6°F | Ht 65.0 in | Wt 256.5 lb

## 2013-04-30 DIAGNOSIS — B20 Human immunodeficiency virus [HIV] disease: Secondary | ICD-10-CM

## 2013-04-30 NOTE — Progress Notes (Signed)
Patient ID: [name], female   DOB: 16-Mar-1966, 47 y.o.   MRN: [id number]          [facility]  Patient Active Problem List   Diagnosis Date Noted  . HIV DISEASE 07/24/2006    Priority: High  . Hidradenitis suppurativa of right >> left axilla 01/30/2012    Priority: Medium  . 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 GRAVE'S DISEASE 01/01/2008    Priority: Medium  . TOBACCO USER 02/08/2007    Priority: Medium  . HYPERTENSION 07/24/2006    Priority: Medium  . ASTHMA 07/24/2006    Priority: Medium  . Leg swelling 04/03/2013  . Abscess of breast, right 01/09/2013  . Breast abscess of female-right 12/18/2012  . Rash 02/16/2012  . Abscess 02/16/2012  . Vaginal Discharge 02/09/2012  . SARCOIDOSIS 02/08/2007  . THYROID NODULE, RIGHT 02/08/2007  . 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 DEPRESSION 07/24/2006  . GERD 07/24/2006    Patient's Medications  New Prescriptions   No medications on file  Previous Medications   ALBUTEROL (PROVENTIL HFA;VENTOLIN HFA) 108 (90 BASE) MCG/ACT INHALER    Inhale 2 puffs into the lungs 4 (four) times daily as needed. For shortness of breath   ARTIFICIAL TEARS OPHTHALMIC SOLUTION    Place 2 drops into both eyes every 4 (four) hours as needed. For dry eyes   ATRIPLA 600-200-300 MG PER TABLET    take 1 tablet by mouth at bedtime   CETIRIZINE (ZYRTEC) 10 MG TABLET    Take 10 mg by mouth daily.   CLINDAMYCIN (CLEOCIN) 150 MG CAPSULE    Take 1 capsule (150 mg total) by mouth every 6 (six) hours.   HYDROCHLOROTHIAZIDE (MICROZIDE) 12.5 MG CAPSULE    Take 12.5 mg by mouth every morning.   LEVOTHYROXINE (SYNTHROID, LEVOTHROID) 200 MCG TABLET    Take 200 mcg by mouth daily.   NIFEDIPINE (PROCARDIA XL/ADALAT-CC) 60 MG 24 HR TABLET    TAKE 1 TABLET (60 MG TOTAL) BY MOUTH DAILY.   OXYCODONE-ACETAMINOPHEN (PERCOCET/ROXICET) 5-325 MG PER TABLET    Take 2 tablets by mouth every 4 (four) hours as needed  for pain.  Modified Medications   No medications on file  Discontinued Medications   No medications on file    Subjective: [name] is feeling better over the past month. The swelling in her legs resolve spontaneously. She had another episode of a severely painful boil under her right arm that required incision and drainage in the emergency department on July 4. No cultures were obtained. She was placed on clindamycin which she is still taking but only about twice daily. She still has a little bit of bloody drainage from her right axilla but no more pain or swelling. She has developed watery diarrhea over the past week.  She has not missed any doses of her Atripla. She continues to smoke cigarettes but says that she is set a quit date for later this summer when she takes her daughter to college. She has not made any lifestyle modification changes to address her weight and is not getting any regular exercise. She states that she always takes her Synthroid. Has not followed up with Dr. Loanne Tate, her endocrinologist, recently.  Review of Systems: Pertinent items are noted in HPI.  Past Medical History  Diagnosis Date  . Hyperthyroidism 08/2006    Grave's Disease -diffuse radiotracer uptake 08/25/06 Thyroid scan-Cold nodule to R lower lobe of thyrorid  . HIV DISEASE 07/24/2006  dx March 05  . Sarcoidosis 02/08/2007    dx as a teenager in [place] from abnl CXR. Completed 2 yrs Prednisone after lung bx confirmation. No symptoms since then.  . THYROID NODULE, RIGHT 02/08/2007  . GRAVE'S DISEASE 01/01/2008  . 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 HYPERTENSION 07/24/2006  . ASTHMA 07/24/2006  . GERD 07/24/2006  . 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 Anemia     Normocytic  . History of hidradenitis suppurativa   . Gastroenteritis 07/10/07  . Menometrorrhagia     hx of  . History of thrush   . Bronchitis 2005  . Pneumonia 2005  . Nephrolithiasis     History  Substance Use Topics    . Smoking status: Current Some Day Smoker -- 0.40 packs/day for 15 years    Types: Cigarettes  . Smokeless tobacco: Never Used     Comment: Would like the patch  . Alcohol Use: No    Family History  Problem Relation Age of Onset  . Cancer Other     FH of Breast Cancer  . Cancer Other     Fh of Lung Cancer  . Heart disease Other   . Hypertension Other   . Stroke Other     Grandparent  . Kidney disease Other     Grandparent  . Diabetes Other     FH of Diabetes  . Hypertension Mother   . Cancer Mother     throat  . Hypertension Father   . Hypertension Sister   . Cancer Maternal Uncle     Stomach Cancer    Allergies  Allergen Reactions  . Tape Rash    Rash   . Lisinopril Cough  . Xylocaine (Lidocaine)     Unknown , patient states she has had lidocaine for IV starts and at the dentist with no problems    Objective: Temp: 98.6 F (37 C) (07/15 1051) Temp src: Oral (07/15 1051) BP: 152/87 mmHg (07/15 1051) Pulse Rate: 93 (07/15 1051)  General: She is in good spirits as usual Oral: No oropharyngeal lesions Skin: She has a superficial, 15 mm, circular lesion in her right axilla that is slightly bloody. There is no induration or fluctuance. There is no odor. The area is nontender. Lungs: Clear Cor: Regular S1 and S2 no murmurs Abdomen: Obese, soft and nontender Joints and extremities: Her lower extremity edema has resolved Neuro: Alert and fully oriented with normal speech and conversation Mood and affect: Normal  Lab Results HIV 1 RNA Quant (copies/mL)  Date Value  04/03/2013 <20   01/10/2013 33*  02/09/2012 <20      CD4 T Cell Abs (cmm)  Date Value  04/03/2013 1140   01/10/2013 860   02/09/2012 1040      Lab Results  Component Value Date   WBC 8.8 04/03/2013   HGB 11.5* 04/03/2013   HCT 35.6* 04/03/2013   MCV 76.1* 04/03/2013   PLT 443* 04/03/2013   BMET    Component Value Date/Time   NA 135 04/03/2013 1553   K 3.5 04/03/2013 1553   CL 102 04/03/2013  1553   CO2 27 04/03/2013 1553   GLUCOSE 109* 04/03/2013 1553   BUN 14 04/03/2013 1553   CREATININE 0.82 04/03/2013 1553   CREATININE 0.93 11/09/2012 0720   CALCIUM 9.4 04/03/2013 1553   GFRNONAA 74* 11/09/2012 0720   GFRAA 85* 11/09/2012 0720   Lab Results  Component Value Date   ALT 73* 04/03/2013   AST 90* 04/03/2013  ALKPHOS 152* 04/03/2013   BILITOT 0.3 04/03/2013   Lab Results  Component Value Date   CHOL 170 01/10/2013   HDL 46 01/10/2013   LDLCALC 106* 01/10/2013   TRIG 91 01/10/2013   CHOLHDL 3.7 01/10/2013   Assessment: Her HIV infection remains under excellent control. I will have her continue Atripla.  I talked to her again about the importance of cigarette cessation and she has had a quit date for the near future.  I talked to her again about the importance of lifestyle modification to address her weight, mild hyperglycemia and elevated liver enzymes which may be due to fatty liver.  I suggested that she talk to her new primary care doctor to see if he recommends any adjustments in her Synthroid dose.  She may be developing clindamycin associated C. difficile colitis. She does not need to continue clindamycin. I will have her stop it today and have asked her to call me if the diarrhea does not resolve promptly.  Plan: 1. Continue Atripla 2. Discontinue clindamycin 3. Cigarette cessation counseling 4. Lifestyle modification counseling 5. Follow up here after lab work in [place] months   Michel Bickers, MD [facility] [place] [phone] pager   [phone] cell 04/30/2013, 11:19 AM

## 2013-05-27 ENCOUNTER — Other Ambulatory Visit: Payer: Self-pay | Admitting: *Deleted

## 2013-05-27 MED ORDER — LEVOTHYROXINE SODIUM 200 MCG PO TABS: 200.0000 ug | ORAL_TABLET | Freq: Every day | ORAL | Status: DC

## 2013-07-01 ENCOUNTER — Telehealth: Payer: Self-pay | Admitting: *Deleted

## 2013-07-01 NOTE — Telephone Encounter (Signed)
Spoke with [name].  She needs an emergency supply of Atripla.  She said she had gotten it years ago.  I told her I did not think she could get it again, but I would call and see.  I was able to get her an emergency supply.  I called her back and left a v/m telling her to call me back so I could give her the information.

## 2013-07-01 NOTE — Telephone Encounter (Signed)
[name] to contact the pt about emergency supply of Atripla.

## 2013-07-02 ENCOUNTER — Telehealth: Payer: Self-pay | Admitting: *Deleted

## 2013-07-02 NOTE — Telephone Encounter (Signed)
I called [name] again today.  I spoke with her about getting her medication.  She was driving at the time so I called back and left the ID#, Bin# and Group# on her voice mail.

## 2013-07-05 ENCOUNTER — Other Ambulatory Visit: Payer: Self-pay | Admitting: *Deleted

## 2013-07-05 DIAGNOSIS — B20 Human immunodeficiency virus [HIV] disease: Secondary | ICD-10-CM

## 2013-07-05 MED ORDER — EFAVIRENZ-EMTRICITAB-TENOFOVIR 600-200-300 MG PO TABS: ORAL_TABLET | ORAL | Status: DC

## 2013-09-06 ENCOUNTER — Telehealth: Payer: Self-pay | Admitting: Endocrinology

## 2013-09-06 ENCOUNTER — Other Ambulatory Visit: Payer: Self-pay | Admitting: *Deleted

## 2013-09-06 ENCOUNTER — Other Ambulatory Visit: Payer: Self-pay | Admitting: Endocrinology

## 2013-09-06 NOTE — Telephone Encounter (Signed)
Pt states her prescription refill for Levothyroxine has ran out Needs refill Pharmacy [facility]  Call back [phone]  Thank you :)

## 2013-10-13 ENCOUNTER — Emergency Department (HOSPITAL_BASED_OUTPATIENT_CLINIC_OR_DEPARTMENT_OTHER)
Admission: EM | Admit: 2013-10-13 | Discharge: 2013-10-14 | Disposition: A | Discharge status: Home / Self Care | Payer: Medicaid Other | Attending: Emergency Medicine | Admitting: Emergency Medicine

## 2013-10-13 ENCOUNTER — Encounter (HOSPITAL_BASED_OUTPATIENT_CLINIC_OR_DEPARTMENT_OTHER): Payer: Self-pay | Admitting: Emergency Medicine

## 2013-10-13 ENCOUNTER — Emergency Department (HOSPITAL_BASED_OUTPATIENT_CLINIC_OR_DEPARTMENT_OTHER): Payer: Medicaid Other

## 2013-10-13 DIAGNOSIS — Z79899 Other long term (current) drug therapy: Secondary | ICD-10-CM | POA: Insufficient documentation

## 2013-10-13 DIAGNOSIS — Z8701 Personal history of pneumonia (recurrent): Secondary | ICD-10-CM | POA: Insufficient documentation

## 2013-10-13 DIAGNOSIS — J111 Influenza due to unidentified influenza virus with other respiratory manifestations: Secondary | ICD-10-CM | POA: Insufficient documentation

## 2013-10-13 DIAGNOSIS — E890 Postprocedural hypothyroidism: Secondary | ICD-10-CM | POA: Insufficient documentation

## 2013-10-13 DIAGNOSIS — L0390 Cellulitis, unspecified: Secondary | ICD-10-CM

## 2013-10-13 DIAGNOSIS — Z21 Asymptomatic human immunodeficiency virus [HIV] infection status: Secondary | ICD-10-CM | POA: Insufficient documentation

## 2013-10-13 DIAGNOSIS — Z8742 Personal history of other diseases of the female genital tract: Secondary | ICD-10-CM | POA: Insufficient documentation

## 2013-10-13 DIAGNOSIS — Z8719 Personal history of other diseases of the digestive system: Secondary | ICD-10-CM | POA: Insufficient documentation

## 2013-10-13 DIAGNOSIS — Z87442 Personal history of urinary calculi: Secondary | ICD-10-CM | POA: Insufficient documentation

## 2013-10-13 DIAGNOSIS — R52 Pain, unspecified: Secondary | ICD-10-CM | POA: Insufficient documentation

## 2013-10-13 DIAGNOSIS — I10 Essential (primary) hypertension: Secondary | ICD-10-CM | POA: Insufficient documentation

## 2013-10-13 DIAGNOSIS — Z8619 Personal history of other infectious and parasitic diseases: Secondary | ICD-10-CM | POA: Insufficient documentation

## 2013-10-13 DIAGNOSIS — J45909 Unspecified asthma, uncomplicated: Secondary | ICD-10-CM | POA: Insufficient documentation

## 2013-10-13 DIAGNOSIS — R61 Generalized hyperhidrosis: Secondary | ICD-10-CM | POA: Insufficient documentation

## 2013-10-13 DIAGNOSIS — E0501 Thyrotoxicosis with diffuse goiter with thyrotoxic crisis or storm: Secondary | ICD-10-CM | POA: Insufficient documentation

## 2013-10-13 DIAGNOSIS — N61 Mastitis without abscess: Secondary | ICD-10-CM | POA: Insufficient documentation

## 2013-10-13 DIAGNOSIS — F17200 Nicotine dependence, unspecified, uncomplicated: Secondary | ICD-10-CM | POA: Insufficient documentation

## 2013-10-13 NOTE — ED Notes (Signed)
Pt repots chills, cough, congestion x 2 days.  Denies fevers.

## 2013-10-13 NOTE — ED Notes (Signed)
Pt also reports boil under (R) arm that she wants treated today,.

## 2013-10-14 MED ORDER — DOXYCYCLINE HYCLATE 100 MG PO CAPS: 100.0000 mg | ORAL_CAPSULE | Freq: Two times a day (BID) | ORAL | Status: DC

## 2013-10-14 MED ORDER — DOXYCYCLINE HYCLATE 100 MG PO TABS
100.0000 mg | ORAL_TABLET | Freq: Once | ORAL | Status: AC
  Administered 2013-10-14: 100 mg via ORAL
  Filled 2013-10-14: qty 1

## 2013-10-14 MED ORDER — CEPHALEXIN 500 MG PO CAPS: 500.0000 mg | ORAL_CAPSULE | Freq: Four times a day (QID) | ORAL | Status: DC

## 2013-10-14 MED ORDER — OXYCODONE-ACETAMINOPHEN 5-325 MG PO TABS: 1.0000 | ORAL_TABLET | Freq: Four times a day (QID) | ORAL | Status: DC | PRN

## 2013-10-14 MED ORDER — OSELTAMIVIR PHOSPHATE 75 MG PO CAPS: 75.0000 mg | ORAL_CAPSULE | Freq: Two times a day (BID) | ORAL | Status: DC

## 2013-10-14 MED ORDER — CEPHALEXIN 250 MG PO CAPS
250.0000 mg | ORAL_CAPSULE | Freq: Once | ORAL | Status: AC
  Administered 2013-10-14: 250 mg via ORAL
  Filled 2013-10-14: qty 1

## 2013-10-14 NOTE — ED Provider Notes (Signed)
CSN: [id number]     Arrival date & time 10/13/13  [id number] History  This chart was scribed for Felicia Wrightsman-Rash, MD by Roxan Diesel, ED scribe.  This patient was seen in room MH09/MH09 and the patient's care was started at 12:15 AM.   Chief Complaint  Patient presents with  . URI   (Consider location/radiation/quality/duration/timing/severity/associated sxs/prior Treatment) Patient is a 47 y.o. female presenting with URI. The history is provided by the patient. No language interpreter was used.  URI Presenting symptoms: congestion and cough   Congestion:    Location:  Nasal   Interferes with sleep: no     Interferes with eating/drinking: no   Cough:    Cough characteristics:  Non-productive   Severity:  Moderate   Onset quality:  Gradual   Timing:  Intermittent   Progression:  Unchanged   Chronicity:  New Severity:  Moderate Onset quality:  Gradual Timing:  Intermittent Progression:  Unchanged Chronicity:  New Relieved by:  Nothing Worsened by:  Nothing tried Associated symptoms: myalgias   Associated symptoms: no arthralgias and no neck pain   Myalgias:    Location:  Generalized Risk factors: immunosuppression and sick contacts   Risk factors: not elderly    HPI Comments: [name] is a 47 y.o. female who presents to the Emergency Department complaining of two days of flu like symptoms including cough, chills, sweats and generalized body aches. Patient has been taking Tylenol and Mucinex with some relief. She reports sick contacts at home.  Patient also complains of an abscess to right breast which she first noticed a week ago which began draining today.   Past Medical History  Diagnosis Date  . Hyperthyroidism 08/2006    Grave's Disease -diffuse radiotracer uptake 08/25/06 Thyroid scan-Cold nodule to R lower lobe of thyrorid  . HIV DISEASE 07/24/2006    dx March 05  . Sarcoidosis 02/08/2007    dx as a teenager in [place] from abnl CXR. Completed 2 yrs  Prednisone after lung bx confirmation. No symptoms since then.  . THYROID NODULE, RIGHT 02/08/2007  . GRAVE'S DISEASE 01/01/2008  . 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 HYPERTENSION 07/24/2006  . ASTHMA 07/24/2006  . GERD 07/24/2006  . 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 Anemia     Normocytic  . History of hidradenitis suppurativa   . Gastroenteritis 07/10/07  . Menometrorrhagia     hx of  . History of thrush   . Bronchitis 2005  . Pneumonia 2005  . Nephrolithiasis    Past Surgical History  Procedure Laterality Date  . Total thyroidectomy  2010  . [place]  1994  . Dilation and curettage of uterus  Feb 2004    s/p for 1st trimester nonviable pregnancy  . Incision and drainage peritonsillar abscess  Mar 03  . Incise and drain abcess  Nov 03    s/p I &D for righ inframmary fold hidradenitis  . Irrigation and debridement abscess  01/31/2012    Procedure: IRRIGATION AND DEBRIDEMENT ABSCESS;  Surgeon: Shann Medal, MD;  Location: [facility];  Service: General;  Laterality: Right;  right breast and axilla   . Eye surgery      sty under eyelid  . Nephrolithotomy  10/02/2012    Procedure: NEPHROLITHOTOMY PERCUTANEOUS;  Surgeon: Alexis Frock, MD;  Location: [facility];  Service: Urology;  Laterality: Right;  First Stage Percutaneous Nephrolithotomy with Surgeon Access, Left Ureteral Stent    . Cystoscopy with stent placement  10/02/2012    Procedure: CYSTOSCOPY  WITH STENT PLACEMENT;  Surgeon: Alexis Frock, MD;  Location: [facility];  Service: Urology;  Laterality: Left;  . Nephrolithotomy  10/04/2012    Procedure: NEPHROLITHOTOMY PERCUTANEOUS SECOND LOOK;  Surgeon: Alexis Frock, MD;  Location: [facility];  Service: Urology;  Laterality: Right;     . Nephrolithotomy  10/08/2012    Procedure: NEPHROLITHOTOMY PERCUTANEOUS;  Surgeon: Alexis Frock, MD;  Location: [facility];  Service: Urology;  Laterality: Right;  THIRD STAGE, nephrostomy tube exchange x 2  . Nephrolithotomy  10/11/2012     Procedure: NEPHROLITHOTOMY PERCUTANEOUS SECOND LOOK;  Surgeon: Alexis Frock, MD;  Location: [facility];  Service: Urology;  Laterality: Right;  RIGHT 4 STAGE PERCUTANOUS NEPHROLITHOTOMY, right URETEROSCOPY WITH HOLMIUM LASER   . Cystoscopy with retrograde pyelogram, ureteroscopy and stent placement  11/09/2012    Procedure: [place], URETEROSCOPY AND STENT PLACEMENT;  Surgeon: Alexis Frock, MD;  Location: [facility];  Service: Urology;  Laterality: Left;  LEFT URETEROSCOPY, STONE MANIPULATION, left STENT exchange   . Cystoscopy w/ ureteral stent removal  11/09/2012    Procedure: CYSTOSCOPY WITH STENT REMOVAL;  Surgeon: Alexis Frock, MD;  Location: [facility];  Service: Urology;  Laterality: Right;  . Breast surgery  1997    Breast Reduction    Family History  Problem Relation Age of Onset  . Cancer Other     FH of Breast Cancer  . Cancer Other     Fh of Lung Cancer  . Heart disease Other   . Hypertension Other   . Stroke Other     Grandparent  . Kidney disease Other     Grandparent  . Diabetes Other     FH of Diabetes  . Hypertension Mother   . Cancer Mother     throat  . Hypertension Father   . Hypertension Sister   . Cancer Maternal Uncle     Stomach Cancer   History  Substance Use Topics  . Smoking status: Current Some Day Smoker -- 0.40 packs/day for 15 years    Types: Cigarettes  . Smokeless tobacco: Never Used     Comment: Would like the patch  . Alcohol Use: No   OB History   Grav Para Term Preterm Abortions TAB SAB Ect Mult Living                 Review of Systems  Constitutional: Positive for chills.       Sweats  HENT: Positive for congestion.   Respiratory: Positive for cough.   Musculoskeletal: Positive for myalgias. Negative for arthralgias and neck pain.  Skin:       "Boil" on right breast.  All other systems reviewed and are negative.    Allergies  Tape; Lisinopril; and Xylocaine  Home Medications   Current Outpatient Rx   Name  Route  Sig  Dispense  Refill  . albuterol (PROVENTIL HFA;VENTOLIN HFA) 108 (90 BASE) MCG/ACT inhaler   Inhalation   Inhale 2 puffs into the lungs 4 (four) times daily as needed. For shortness of breath         . ARTIFICIAL TEARS ophthalmic solution   Both Eyes   Place 2 drops into both eyes every 4 (four) hours as needed. For dry eyes         . cetirizine (ZYRTEC) 10 MG tablet   Oral   Take 10 mg by mouth daily.         Marland Kitchen efavirenz-emtricitabine-tenofovir (ATRIPLA) 600-200-300 MG per tablet  take 1 tablet by mouth at bedtime   30 tablet   6   . hydrochlorothiazide (MICROZIDE) 12.5 MG capsule   Oral   Take 12.5 mg by mouth every morning.         Marland Kitchen levothyroxine (SYNTHROID, LEVOTHROID) 200 MCG tablet      TAKE 1 TABLET (200 MCG TOTAL) BY MOUTH DAILY.   30 tablet   0     One month refill only, patient needs ov for furthe ...   . NIFEdipine (PROCARDIA XL/ADALAT-CC) 60 MG 24 hr tablet      TAKE 1 TABLET (60 MG TOTAL) BY MOUTH DAILY.   30 tablet   7   . oxyCODONE-acetaminophen (PERCOCET/ROXICET) 5-325 MG per tablet   Oral   Take 2 tablets by mouth every 4 (four) hours as needed for pain.   15 tablet   0    Triage Vitals: BP 163/86  Pulse 89  Temp(Src) 98.7 F (37.1 C) (Oral)  Resp 18  Ht 5\' 4"  (1.626 m)  Wt 220 lb (99.791 kg)  BMI 37.74 kg/m2  SpO2 97% Physical Exam  Nursing note and vitals reviewed. Constitutional: She is oriented to person, place, and time. She appears well-developed and well-nourished. No distress.  HENT:  Head: Normocephalic and atraumatic.  Mouth/Throat: Oropharynx is clear and moist. No oropharyngeal exudate.  Eyes: EOM are normal. Pupils are equal, round, and reactive to light.  Neck: Neck supple. No tracheal deviation present.  Cardiovascular: Normal rate, regular rhythm and normal heart sounds.   Pulmonary/Chest: Effort normal and breath sounds normal. No respiratory distress. She has no wheezes. She has no rales.   Abdominal: Soft. Bowel sounds are normal. She exhibits no distension and no mass. There is no tenderness. There is no rebound and no guarding.  Musculoskeletal: Normal range of motion.       Arms: Lymphadenopathy:    She has no cervical adenopathy.  Neurological: She is alert and oriented to person, place, and time.  Skin: Skin is warm and dry. Rash noted.  Cellulitis on right lateral breast with 9 mm opening.   Psychiatric: She has a normal mood and affect. Her behavior is normal.    ED Course  Procedures (including critical care time) DIAGNOSTIC STUDIES: Oxygen Saturation is 97% on room air, normal by my interpretation.    COORDINATION OF CARE: 12:18 AM: Discussed treatment plan which includes Tamiflu and pain medications for flu symptoms, and antibiotics and f/u with breast center for abscess. Patient expressed understanding and agreed to plan.     Labs Review Labs Reviewed - No data to display Imaging Review Dg Chest 2 View  10/13/2013   CLINICAL DATA:  Chills. Cough. Congestion for 2 days. Mobile well into the right arm. History of HIV positive. History of sarcoidosis.  EXAM: CHEST  2 VIEW  COMPARISON:  05/24/2012  FINDINGS: The heart size and mediastinal contours are within normal limits. Both lungs are clear. The visualized skeletal structures are unremarkable. No significant change since previous study  IMPRESSION: No active cardiopulmonary disease.   Electronically Signed   By: Lucienne Capers M.D.   On: 10/13/2013 23:17    EKG Interpretation   None       MDM  No diagnosis found. Refer to [facility] breast clinic for breast cellulitis and imaging.  Will treat flu with tamiflu and cellulitis with doxycycline and keflex  I personally performed the services described in this documentation, which was scribed in my presence. The recorded information has  been reviewed and is accurate.     Carlisle Beers, MD 10/14/13 [phone]

## 2013-10-15 ENCOUNTER — Telehealth: Payer: Self-pay | Admitting: *Deleted

## 2013-10-15 NOTE — Telephone Encounter (Signed)
Pt believes she needs to be out-of-work longer.  No MDs available in [facility] this holiday week.  RN advised pt to go to Urgent Care either at [facility] or [place] (part of [place]).  Pt stated that she would try to find a person to transport her to UC today.

## 2013-10-23 ENCOUNTER — Other Ambulatory Visit: Payer: Managed Care, Other (non HMO)

## 2013-10-23 DIAGNOSIS — B20 Human immunodeficiency virus [HIV] disease: Secondary | ICD-10-CM

## 2013-10-23 LAB — CBC
HEMATOCRIT: 38.6 % (ref 36.0–46.0)
HEMOGLOBIN: 12.9 g/dL (ref 12.0–15.0)
MCH: 27.6 pg (ref 26.0–34.0)
MCHC: 33.4 g/dL (ref 30.0–36.0)
MCV: 82.7 fL (ref 78.0–100.0)
Platelets: 398 10*3/uL (ref 150–400)
RBC: 4.67 MIL/uL (ref 3.87–5.11)
RDW: 18.3 % — ABNORMAL HIGH (ref 11.5–15.5)
WBC: 8.5 10*3/uL (ref 4.0–10.5)

## 2013-10-23 LAB — COMPREHENSIVE METABOLIC PANEL
ALT: 40 U/L — AB (ref 0–35)
AST: 50 U/L — ABNORMAL HIGH (ref 0–37)
Albumin: 4 g/dL (ref 3.5–5.2)
Alkaline Phosphatase: 139 U/L — ABNORMAL HIGH (ref 39–117)
BILIRUBIN TOTAL: 0.3 mg/dL (ref 0.3–1.2)
BUN: 14 mg/dL (ref 6–23)
CALCIUM: 9.5 mg/dL (ref 8.4–10.5)
CHLORIDE: 103 meq/L (ref 96–112)
CO2: 30 meq/L (ref 19–32)
CREATININE: 0.88 mg/dL (ref 0.50–1.10)
GLUCOSE: 84 mg/dL (ref 70–99)
Potassium: 3.7 mEq/L (ref 3.5–5.3)
Sodium: 140 mEq/L (ref 135–145)
Total Protein: 8.1 g/dL (ref 6.0–8.3)

## 2013-10-23 LAB — LIPID PANEL
CHOL/HDL RATIO: 4.7 ratio
CHOLESTEROL: 186 mg/dL (ref 0–200)
HDL: 40 mg/dL (ref >39)
LDL Cholesterol: 120 mg/dL — ABNORMAL HIGH (ref 0–99)
TRIGLYCERIDES: 132 mg/dL (ref <150)
VLDL: 26 mg/dL (ref 0–40)

## 2013-10-24 LAB — T-HELPER CELL (CD4) - (RCID CLINIC ONLY)
CD4 T CELL ABS: 1360 /uL (ref 400–2700)
CD4 T CELL HELPER: 35 % (ref 33–55)

## 2013-10-24 LAB — RPR

## 2013-10-25 LAB — HIV-1 RNA QUANT-NO REFLEX-BLD
HIV 1 RNA Quant: <20 copies/mL (ref <20)
HIV-1 RNA Quant, Log: <1.3 {Log} (ref <1.30)

## 2013-11-05 ENCOUNTER — Other Ambulatory Visit: Payer: Self-pay | Admitting: Internal Medicine

## 2013-11-05 ENCOUNTER — Ambulatory Visit (INDEPENDENT_AMBULATORY_CARE_PROVIDER_SITE_OTHER): Payer: Medicaid Other | Admitting: Internal Medicine

## 2013-11-05 ENCOUNTER — Encounter: Payer: Self-pay | Admitting: Internal Medicine

## 2013-11-05 VITALS — BP 188/115 | HR 71 | Temp 98.4°F | Ht 60.0 in | Wt 211.8 lb

## 2013-11-05 DIAGNOSIS — J069 Acute upper respiratory infection, unspecified: Secondary | ICD-10-CM | POA: Insufficient documentation

## 2013-11-05 DIAGNOSIS — B20 Human immunodeficiency virus [HIV] disease: Secondary | ICD-10-CM

## 2013-11-05 NOTE — Progress Notes (Signed)
Patient ID: [name], female   DOB: 1966-06-19, 48 y.o.   MRN: [id number]          [facility] for [facility]ious [facility]  Patient Active Problem List   Diagnosis Date Noted  . HIV DISEASE 07/24/2006    Priority: High  . Upper respiratory infection, acute 11/05/2013    Priority: Medium  . Hidradenitis suppurativa of right >> left axilla 01/30/2012    Priority: Medium  . 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 GRAVE'S DISEASE 01/01/2008    Priority: Medium  . TOBACCO USER 02/08/2007    Priority: Medium  . HYPERTENSION 07/24/2006    Priority: Medium  . ASTHMA 07/24/2006    Priority: Medium  . Leg swelling 04/03/2013  . Abscess of breast, right 01/09/2013  . Breast abscess of female-right 12/18/2012  . Rash 02/16/2012  . Abscess 02/16/2012  . Vaginal Discharge 02/09/2012  . SARCOIDOSIS 02/08/2007  . THYROID NODULE, RIGHT 02/08/2007  . 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 DEPRESSION 07/24/2006  . GERD 07/24/2006    Patient's Medications  New Prescriptions   No medications on file  Previous Medications   ALBUTEROL (PROVENTIL HFA;VENTOLIN HFA) 108 (90 BASE) MCG/ACT INHALER    Inhale 2 puffs into the lungs 4 (four) times daily as needed. For shortness of breath   ARTIFICIAL TEARS OPHTHALMIC SOLUTION    Place 2 drops into both eyes every 4 (four) hours as needed. For dry eyes   CETIRIZINE (ZYRTEC) 10 MG TABLET    Take 10 mg by mouth daily.   EFAVIRENZ-EMTRICITABINE-TENOFOVIR (ATRIPLA) 600-200-300 MG PER TABLET    take 1 tablet by mouth at bedtime   FLUTICASONE (FLONASE) 50 MCG/ACT NASAL SPRAY    Place into both nostrils daily.   HYDROCHLOROTHIAZIDE (MICROZIDE) 12.5 MG CAPSULE    Take 12.5 mg by mouth every morning.   LEVOTHYROXINE (SYNTHROID, LEVOTHROID) 200 MCG TABLET    TAKE 1 TABLET (200 MCG TOTAL) BY MOUTH DAILY.   NIFEDIPINE (PROCARDIA XL/ADALAT-CC) 60 MG 24 HR TABLET    TAKE 1 TABLET (60 MG TOTAL) BY MOUTH DAILY.   OXYCODONE-ACETAMINOPHEN (PERCOCET) 5-325 MG PER TABLET    Take 1 tablet by mouth every 6 (six) hours as needed.   OXYCODONE-ACETAMINOPHEN (PERCOCET/ROXICET) 5-325 MG PER TABLET    Take 2 tablets by mouth every 4 (four) hours as needed for pain.  Modified Medications   No medications on file  Discontinued Medications   CEPHALEXIN (KEFLEX) 500 MG CAPSULE    Take 1 capsule (500 mg total) by mouth 4 (four) times daily.   DOXYCYCLINE (VIBRAMYCIN) 100 MG CAPSULE    Take 1 capsule (100 mg total) by mouth 2 (two) times daily. One po bid x 7 days   OSELTAMIVIR (TAMIFLU) 75 MG CAPSULE    Take 1 capsule (75 mg total) by mouth every 12 (twelve) hours.   PSEUDOEPHEDRINE (SUDAFED) 30 MG TABLET    Take 30 mg by mouth every 4 (four) hours as needed for congestion.    Subjective: [name] is in for her routine visit. She has not missed any doses of her Atripla. Could not establish primary care he and has not seen Dr. Loanne Drilling, her endocrinologist in quite some time. She was recently treated for possible influenza with Tamiflu. She felt better and then recently has developed sinus congestion and feels like she has a "cold". She did not have any fever, chills or sweats. She does not have any cough or shortness of breath. She has been taking  Sudafed. She ran out of her blood pressure medication and has not taken it today.  She was able to quit smoking cigarettes during the first 6 months of last year but started smoking again. She says that she is highly motivated to quit again. He says the nicotine patches helped her quit last time. However, she has not bought any new nicotine patches and does not have a current plan to quit or a quit date. Review of Systems: Pertinent items are noted in HPI.  Past Medical History  Diagnosis Date  . Hyperthyroidism 08/2006    Grave's Disease -diffuse radiotracer uptake 08/25/06 Thyroid scan-Cold nodule to R lower lobe of thyrorid  . HIV DISEASE 07/24/2006    dx March 05  .  Sarcoidosis 02/08/2007    dx as a teenager in [place] from abnl CXR. Completed 2 yrs Prednisone after lung bx confirmation. No symptoms since then.  . THYROID NODULE, RIGHT 02/08/2007  . GRAVE'S DISEASE 01/01/2008  . 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 HYPERTENSION 07/24/2006  . ASTHMA 07/24/2006  . GERD 07/24/2006  . 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 Anemia     Normocytic  . History of hidradenitis suppurativa   . Gastroenteritis 07/10/07  . Menometrorrhagia     hx of  . History of thrush   . Bronchitis 2005  . Pneumonia 2005  . Nephrolithiasis     History  Substance Use Topics  . Smoking status: Current Some Day Smoker -- 0.50 packs/day for 15 years    Types: Cigarettes  . Smokeless tobacco: Never Used     Comment: Would like the patch  . Alcohol Use: No    Family History  Problem Relation Age of Onset  . Cancer Other     FH of Breast Cancer  . Cancer Other     Fh of Lung Cancer  . Heart disease Other   . Hypertension Other   . Stroke Other     Grandparent  . Kidney disease Other     Grandparent  . Diabetes Other     FH of Diabetes  . Hypertension Mother   . Cancer Mother     throat  . Hypertension Father   . Hypertension Sister   . Cancer Maternal Uncle     Stomach Cancer    Allergies  Allergen Reactions  . Tape Rash    Rash   . Lisinopril Cough  . Xylocaine [Lidocaine]     Unknown , patient states she has had lidocaine for IV starts and at the dentist with no problems    Objective: Temp: 98.4 F (36.9 C) (01/20 1524) Temp src: Oral (01/20 1524) BP: 188/115 mmHg (01/20 1524) Pulse Rate: 71 (01/20 1524)  Body mass index is 41.35 kg/(m^2).  General: She sounds congested Oral: No oropharyngeal lesions Skin: No rash Lungs: Clear Cor: Regular S1-S2 no murmurs Abdomen: Obese, soft nontender Joints and extremities: Normal  Lab Results Lab Results  Component Value Date   WBC 8.5 10/23/2013   HGB 12.9 10/23/2013   HCT 38.6 10/23/2013    MCV 82.7 10/23/2013   PLT 398 10/23/2013    Lab Results  Component Value Date   CREATININE 0.88 10/23/2013   BUN 14 10/23/2013   NA 140 10/23/2013   K 3.7 10/23/2013   CL 103 10/23/2013   CO2 30 10/23/2013    Lab Results  Component Value Date   ALT 40* 10/23/2013   AST 50* 10/23/2013   ALKPHOS 139* 10/23/2013   BILITOT 0.3  10/23/2013    Lab Results  Component Value Date   CHOL 186 10/23/2013   HDL 40 10/23/2013   LDLCALC 120* 10/23/2013   TRIG 132 10/23/2013   CHOLHDL 4.7 10/23/2013    Lab Results HIV 1 RNA Quant (copies/mL)  Date Value  10/23/2013 <20   04/03/2013 <20   01/10/2013 33*     CD4 T Cell Abs (/uL)  Date Value  10/23/2013 1360   04/03/2013 1140   01/10/2013 860      Assessment: Her HIV infection is under excellent control. I will continue Atripla.  Her blood pressure is out of control. I will have her stop Sudafed and refill and restart her blood pressure medications this afternoon.  She has an upper respiratory infection. I will have her switch Sudafed to Afrin nasal spray for symptomatic relief.  I talked to her about the importance of cigarette cessation.  Plan: 1. Continue Atripla 2. Restart antihypertensive meds 3. Discontinue Sudafed 4. Afrin nasal spray as needed for sinus congestion 5. Cigarette cessation counseling provided 6. Will help her establish primary care 7. Follow up here after lab work in [place] months   Michel Bickers, MD [facility] for [place] [phone] pager   [phone] cell 11/05/2013, 3:54 PM

## 2013-11-06 ENCOUNTER — Ambulatory Visit: Payer: Medicaid Other | Admitting: Internal Medicine

## 2013-12-13 ENCOUNTER — Other Ambulatory Visit: Payer: Self-pay | Admitting: *Deleted

## 2013-12-13 MED ORDER — LEVOTHYROXINE SODIUM 200 MCG PO TABS: ORAL_TABLET | ORAL | Status: DC

## 2013-12-17 ENCOUNTER — Encounter: Payer: Self-pay | Admitting: *Deleted

## 2013-12-17 ENCOUNTER — Telehealth: Payer: Self-pay | Admitting: *Deleted

## 2013-12-17 ENCOUNTER — Ambulatory Visit (INDEPENDENT_AMBULATORY_CARE_PROVIDER_SITE_OTHER): Payer: Medicaid Other | Admitting: Internal Medicine

## 2013-12-17 VITALS — BP 147/85 | HR 86 | Ht 65.0 in | Wt 198.0 lb

## 2013-12-17 DIAGNOSIS — N898 Other specified noninflammatory disorders of vagina: Secondary | ICD-10-CM

## 2013-12-17 DIAGNOSIS — L732 Hidradenitis suppurativa: Secondary | ICD-10-CM

## 2013-12-17 MED ORDER — FLUCONAZOLE 100 MG PO TABS: 100.0000 mg | ORAL_TABLET | Freq: Every day | ORAL | Status: DC

## 2013-12-17 MED ORDER — DOXYCYCLINE HYCLATE 100 MG PO TABS: 100.0000 mg | ORAL_TABLET | Freq: Two times a day (BID) | ORAL | Status: DC

## 2013-12-17 NOTE — Telephone Encounter (Signed)
Patient called requesting an appointment - she feels like she may have come down with the flu again.  Pt reporting whole body aches, subjective fevers, lethargy since Sunday 3/1.  Patient has tried supportive care, would rather have a physician assess her.  Pt given appointment for this morning with Dr. [name] at 11:15. Landis Gandy, RN

## 2013-12-17 NOTE — Progress Notes (Signed)
Patient ID: [name], female   DOB: 01/24/1966, 48 y.o.   MRN: [id number]          [facility] for Infectious Disease  Patient Active Problem List   Diagnosis Date Noted  . HIV DISEASE 07/24/2006    Priority: High  . Upper respiratory infection, acute 11/05/2013    Priority: Medium  . Hidradenitis suppurativa of right >> left axilla 01/30/2012    Priority: Medium  . 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 GRAVE'S DISEASE 01/01/2008    Priority: Medium  . TOBACCO USER 02/08/2007    Priority: Medium  . HYPERTENSION 07/24/2006    Priority: Medium  . ASTHMA 07/24/2006    Priority: Medium  . Leg swelling 04/03/2013  . Abscess of breast, right 01/09/2013  . Breast abscess of female-right 12/18/2012  . Rash 02/16/2012  . Abscess 02/16/2012  . Vaginal Discharge 02/09/2012  . SARCOIDOSIS 02/08/2007  . THYROID NODULE, RIGHT 02/08/2007  . 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 DEPRESSION 07/24/2006  . GERD 07/24/2006    Patient's Medications  New Prescriptions   DOXYCYCLINE (VIBRA-TABS) 100 MG TABLET    Take 1 tablet (100 mg total) by mouth 2 (two) times daily.  Previous Medications   ALBUTEROL (PROVENTIL HFA;VENTOLIN HFA) 108 (90 BASE) MCG/ACT INHALER    Inhale 2 puffs into the lungs 4 (four) times daily as needed. For shortness of breath   ARTIFICIAL TEARS OPHTHALMIC SOLUTION    Place 2 drops into both eyes every 4 (four) hours as needed. For dry eyes   CETIRIZINE (ZYRTEC) 10 MG TABLET    Take 10 mg by mouth daily.   EFAVIRENZ-EMTRICITABINE-TENOFOVIR (ATRIPLA) 600-200-300 MG PER TABLET    take 1 tablet by mouth at bedtime   FLUTICASONE (FLONASE) 50 MCG/ACT NASAL SPRAY    Place into both nostrils daily.   HYDROCHLOROTHIAZIDE (MICROZIDE) 12.5 MG CAPSULE    Take 12.5 mg by mouth every morning.   HYDROCHLOROTHIAZIDE (MICROZIDE) 12.5 MG CAPSULE    TAKE ONE CAPSULE BY MOUTH EVERY DAY   LEVOTHYROXINE (SYNTHROID, LEVOTHROID) 200 MCG TABLET     TAKE 1 TABLET (200 MCG TOTAL) BY MOUTH DAILY.   NIFEDIPINE (PROCARDIA XL/ADALAT-CC) 60 MG 24 HR TABLET    TAKE 1 TABLET (60 MG TOTAL) BY MOUTH DAILY.   OXYCODONE-ACETAMINOPHEN (PERCOCET) 5-325 MG PER TABLET    Take 1 tablet by mouth every 6 (six) hours as needed.   OXYCODONE-ACETAMINOPHEN (PERCOCET/ROXICET) 5-325 MG PER TABLET    Take 2 tablets by mouth every 4 (four) hours as needed for pain.  Modified Medications   No medications on file  Discontinued Medications   No medications on file    Subjective: Meredyth is seen on a workin basis. She developed subjective fevers 2 days ago associated with myalgias and a new boil in her left axilla.  She rarely misses her Atripla.  Review of Systems: Pertinent items are noted in HPI.  Past Medical History  Diagnosis Date  . Hyperthyroidism 08/2006    Grave's Disease -diffuse radiotracer uptake 08/25/06 Thyroid scan-Cold nodule to R lower lobe of thyrorid  . HIV DISEASE 07/24/2006    dx March 05  . Sarcoidosis 02/08/2007    dx as a teenager in [place] from abnl CXR. Completed 2 yrs Prednisone after lung bx confirmation. No symptoms since then.  . THYROID NODULE, RIGHT 02/08/2007  . GRAVE'S DISEASE 01/01/2008  . 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 HYPERTENSION 07/24/2006  . ASTHMA 07/24/2006  . GERD 07/24/2006  .  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 Anemia     Normocytic  . History of hidradenitis suppurativa   . Gastroenteritis 07/10/07  . Menometrorrhagia     hx of  . History of thrush   . Bronchitis 2005  . Pneumonia 2005  . Nephrolithiasis     History  Substance Use Topics  . Smoking status: Current Some Day Smoker -- 0.50 packs/day for 15 years    Types: Cigarettes  . Smokeless tobacco: Never Used     Comment: Would like the patch  . Alcohol Use: No    Family History  Problem Relation Age of Onset  . Cancer Other     FH of Breast Cancer  . Cancer Other     Fh of Lung Cancer  . Heart disease Other   .  Hypertension Other   . Stroke Other     Grandparent  . Kidney disease Other     Grandparent  . Diabetes Other     FH of Diabetes  . Hypertension Mother   . Cancer Mother     throat  . Hypertension Father   . Hypertension Sister   . Cancer Maternal Uncle     Stomach Cancer    Allergies  Allergen Reactions  . Tape Rash    Rash   . Lisinopril Cough  . Xylocaine [Lidocaine]     Unknown , patient states she has had lidocaine for IV starts and at the dentist with no problems    Objective: BP: 147/85 mmHg (03/03 1132) Pulse Rate: 86 (03/03 1132)  Body mass index is 32.95 kg/(m^2).  General: looks uncomfortable Oral: no orapharyngeal lesions Skin: Non-fluctuant boil in left axilla Lungs: clear Cor: reg S1 and S2 without murmurs  Lab Results Lab Results  Component Value Date   WBC 8.5 10/23/2013   HGB 12.9 10/23/2013   HCT 38.6 10/23/2013   MCV 82.7 10/23/2013   PLT 398 10/23/2013    Lab Results  Component Value Date   CREATININE 0.88 10/23/2013   BUN 14 10/23/2013   NA 140 10/23/2013   K 3.7 10/23/2013   CL 103 10/23/2013   CO2 30 10/23/2013    Lab Results  Component Value Date   ALT 40* 10/23/2013   AST 50* 10/23/2013   ALKPHOS 139* 10/23/2013   BILITOT 0.3 10/23/2013    Lab Results  Component Value Date   CHOL 186 10/23/2013   HDL 40 10/23/2013   LDLCALC 120* 10/23/2013   TRIG 132 10/23/2013   CHOLHDL 4.7 10/23/2013    Lab Results HIV 1 RNA Quant (copies/mL)  Date Value  10/23/2013 <20   04/03/2013 <20   01/10/2013 33*     CD4 T Cell Abs (/uL)  Date Value  10/23/2013 1360   04/03/2013 1140   01/10/2013 860      Assessment: Recurrent boils.  Plan: 1. Doxycycline 100 mg twice daily for 7 days   Michel Bickers, MD [facility] for [place] [phone] pager   [phone] cell 12/17/2013, 12:00 PM

## 2014-01-06 ENCOUNTER — Other Ambulatory Visit: Payer: Self-pay | Admitting: *Deleted

## 2014-01-06 DIAGNOSIS — B20 Human immunodeficiency virus [HIV] disease: Secondary | ICD-10-CM

## 2014-01-06 MED ORDER — EFAVIRENZ-EMTRICITAB-TENOFOVIR 600-200-300 MG PO TABS: ORAL_TABLET | ORAL | Status: DC

## 2014-01-19 ENCOUNTER — Other Ambulatory Visit: Payer: Self-pay | Admitting: Internal Medicine

## 2014-01-22 ENCOUNTER — Other Ambulatory Visit: Payer: Self-pay | Admitting: *Deleted

## 2014-01-22 NOTE — Telephone Encounter (Signed)
Patient is requesting a refill of levothyroxine, rx denied as patient has not been seen in over a year.

## 2014-01-29 ENCOUNTER — Other Ambulatory Visit: Payer: Self-pay | Admitting: *Deleted

## 2014-01-29 DIAGNOSIS — B20 Human immunodeficiency virus [HIV] disease: Secondary | ICD-10-CM

## 2014-01-29 MED ORDER — EFAVIRENZ-EMTRICITAB-TENOFOVIR 600-200-300 MG PO TABS: ORAL_TABLET | ORAL | Status: DC

## 2014-03-12 ENCOUNTER — Other Ambulatory Visit: Payer: Self-pay | Admitting: Internal Medicine

## 2014-04-04 ENCOUNTER — Telehealth: Payer: Self-pay | Admitting: *Deleted

## 2014-04-04 NOTE — Telephone Encounter (Signed)
Needing Lab, MD and PAP smear appts

## 2014-05-21 ENCOUNTER — Other Ambulatory Visit: Payer: Self-pay | Admitting: Internal Medicine

## 2014-05-21 ENCOUNTER — Other Ambulatory Visit: Payer: Medicaid Other

## 2014-05-29 ENCOUNTER — Other Ambulatory Visit (INDEPENDENT_AMBULATORY_CARE_PROVIDER_SITE_OTHER): Payer: Managed Care, Other (non HMO)

## 2014-05-29 DIAGNOSIS — B20 Human immunodeficiency virus [HIV] disease: Secondary | ICD-10-CM

## 2014-05-30 LAB — T-HELPER CELL (CD4) - (RCID CLINIC ONLY)
CD4 T CELL ABS: 1180 /uL (ref 400–2700)
CD4 T CELL HELPER: 37 % (ref 33–55)

## 2014-06-02 LAB — HIV-1 RNA QUANT-NO REFLEX-BLD: HIV 1 RNA Quant: <20 copies/mL (ref <20)

## 2014-06-11 ENCOUNTER — Encounter: Payer: Self-pay | Admitting: Internal Medicine

## 2014-06-11 ENCOUNTER — Ambulatory Visit (INDEPENDENT_AMBULATORY_CARE_PROVIDER_SITE_OTHER): Payer: Managed Care, Other (non HMO) | Admitting: Internal Medicine

## 2014-06-11 VITALS — BP 165/97 | HR 78 | Temp 97.8°F | Wt 247.5 lb

## 2014-06-11 DIAGNOSIS — L0293 Carbuncle, unspecified: Secondary | ICD-10-CM

## 2014-06-11 DIAGNOSIS — B20 Human immunodeficiency virus [HIV] disease: Secondary | ICD-10-CM

## 2014-06-11 DIAGNOSIS — L732 Hidradenitis suppurativa: Secondary | ICD-10-CM

## 2014-06-11 DIAGNOSIS — L0292 Furuncle, unspecified: Secondary | ICD-10-CM | POA: Insufficient documentation

## 2014-06-11 DIAGNOSIS — Z23 Encounter for immunization: Secondary | ICD-10-CM

## 2014-06-11 HISTORY — DX: Furuncle, unspecified: L02.92

## 2014-06-11 MED ORDER — MUPIROCIN 2 % EX OINT: 1.0000 "application " | TOPICAL_OINTMENT | Freq: Two times a day (BID) | CUTANEOUS | Status: DC

## 2014-06-11 MED ORDER — DOXYCYCLINE HYCLATE 100 MG PO TABS: 100.0000 mg | ORAL_TABLET | Freq: Two times a day (BID) | ORAL | Status: DC

## 2014-06-11 NOTE — Patient Instructions (Signed)
Beta with chlorhexidine (Hibiclens) 2-3 times each week for the next month.

## 2014-06-11 NOTE — Progress Notes (Signed)
Patient ID: [name], female   DOB: 06-Mar-1966, 48 y.o.   MRN: [id number]          Patient Active Problem List   Diagnosis Date Noted  . HIV DISEASE 07/24/2006    Priority: High  . Upper respiratory infection, acute 11/05/2013    Priority: Medium  . Hidradenitis suppurativa of right >> left axilla 01/30/2012    Priority: Medium  . 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 GRAVE'S DISEASE 01/01/2008    Priority: Medium  . TOBACCO USER 02/08/2007    Priority: Medium  . HYPERTENSION 07/24/2006    Priority: Medium  . ASTHMA 07/24/2006    Priority: Medium  . Recurrent boils 06/11/2014  . Leg swelling 04/03/2013  . Abscess of breast, right 01/09/2013  . Breast abscess of female-right 12/18/2012  . Rash 02/16/2012  . Abscess 02/16/2012  . Vaginal Discharge 02/09/2012  . SARCOIDOSIS 02/08/2007  . THYROID NODULE, RIGHT 02/08/2007  . 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 DEPRESSION 07/24/2006  . GERD 07/24/2006    Patient's Medications  New Prescriptions   MUPIROCIN OINTMENT (BACTROBAN) 2 %    Place 1 application into the nose 2 (two) times daily.  Previous Medications   ALBUTEROL (PROVENTIL HFA;VENTOLIN HFA) 108 (90 BASE) MCG/ACT INHALER    Inhale 2 puffs into the lungs 4 (four) times daily as needed. For shortness of breath   ARTIFICIAL TEARS OPHTHALMIC SOLUTION    Place 2 drops into both eyes every 4 (four) hours as needed. For dry eyes   CETIRIZINE (ZYRTEC) 10 MG TABLET    Take 10 mg by mouth daily.   EFAVIRENZ-EMTRICITABINE-TENOFOVIR (ATRIPLA) 600-200-300 MG PER TABLET    take 1 tablet by mouth at bedtime   FLUCONAZOLE (DIFLUCAN) 100 MG TABLET    Take 1 tablet (100 mg total) by mouth daily.   FLUTICASONE (FLONASE) 50 MCG/ACT NASAL SPRAY    Place into both nostrils daily.   HYDROCHLOROTHIAZIDE (MICROZIDE) 12.5 MG CAPSULE    Take 12.5 mg by mouth every morning.   HYDROCHLOROTHIAZIDE (MICROZIDE) 12.5 MG CAPSULE    TAKE ONE CAPSULE BY MOUTH  EVERY DAY   LEVOTHYROXINE (SYNTHROID, LEVOTHROID) 200 MCG TABLET    TAKE 1 TABLET BY MOUTH DAILY.   NIFEDIPINE (PROCARDIA XL/ADALAT-CC) 60 MG 24 HR TABLET    TAKE 1 TABLET (60 MG TOTAL) BY MOUTH DAILY.   OXYCODONE-ACETAMINOPHEN (PERCOCET) 5-325 MG PER TABLET    Take 1 tablet by mouth every 6 (six) hours as needed.   OXYCODONE-ACETAMINOPHEN (PERCOCET/ROXICET) 5-325 MG PER TABLET    Take 2 tablets by mouth every 4 (four) hours as needed for pain.  Modified Medications   Modified Medication Previous Medication   DOXYCYCLINE (VIBRA-TABS) 100 MG TABLET doxycycline (VIBRA-TABS) 100 MG tablet      Take 1 tablet (100 mg total) by mouth 2 (two) times daily.    Take 1 tablet (100 mg total) by mouth 2 (two) times daily.  Discontinued Medications   No medications on file    Subjective: Ji is in for her routine visit. She has missed only 2 doses of her Atripla since her last visit when she was late getting her refill. She has not established primary care yet. She is hoping to quit smoking cigarettes completely next month. She is still having problem with recurrent boils. Review of Systems: Pertinent items are noted in HPI.  Past Medical History  Diagnosis Date  . Hyperthyroidism 08/2006    Grave's Disease -diffuse radiotracer uptake 08/25/06  Thyroid scan-Cold nodule to R lower lobe of thyrorid  . HIV DISEASE 07/24/2006    dx March 05  . Sarcoidosis 02/08/2007    dx as a teenager in [place] from abnl CXR. Completed 2 yrs Prednisone after lung bx confirmation. No symptoms since then.  . THYROID NODULE, RIGHT 02/08/2007  . GRAVE'S DISEASE 01/01/2008  . 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 HYPERTENSION 07/24/2006  . ASTHMA 07/24/2006  . GERD 07/24/2006  . 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 Anemia     Normocytic  . History of hidradenitis suppurativa   . Gastroenteritis 07/10/07  . Menometrorrhagia     hx of  . History of thrush   . Bronchitis 2005  . Pneumonia 2005  .  Nephrolithiasis     History  Substance Use Topics  . Smoking status: Current Some Day Smoker -- 0.50 packs/day for 15 years    Types: Cigarettes  . Smokeless tobacco: Never Used     Comment: will stop on 06/21/14  . Alcohol Use: No    Family History  Problem Relation Age of Onset  . Cancer Other     FH of Breast Cancer  . Cancer Other     Fh of Lung Cancer  . Heart disease Other   . Hypertension Other   . Stroke Other     Grandparent  . Kidney disease Other     Grandparent  . Diabetes Other     FH of Diabetes  . Hypertension Mother   . Cancer Mother     throat  . Hypertension Father   . Hypertension Sister   . Cancer Maternal Uncle     Stomach Cancer    Allergies  Allergen Reactions  . Tape Rash    Rash   . Lisinopril Cough  . Xylocaine [Lidocaine]     Unknown , patient states she has had lidocaine for IV starts and at the dentist with no problems    Objective: Temp: 97.8 F (36.6 C) (08/26 1127) Temp src: Oral (08/26 1127) BP: 165/97 mmHg (08/26 1127) Pulse Rate: 78 (08/26 1127) Body mass index is 41.19 kg/(m^2).  General: she is in good spirits. She is obese. Oral: no oropharyngeal lesions Skin: nonfluctuant, early boil on left lower abdomen Lungs: clear Cor: regular S1 and S2 no murmurs  Lab Results Lab Results  Component Value Date   WBC 8.5 10/23/2013   HGB 12.9 10/23/2013   HCT 38.6 10/23/2013   MCV 82.7 10/23/2013   PLT 398 10/23/2013    Lab Results  Component Value Date   CREATININE 0.88 10/23/2013   BUN 14 10/23/2013   NA 140 10/23/2013   K 3.7 10/23/2013   CL 103 10/23/2013   CO2 30 10/23/2013    Lab Results  Component Value Date   ALT 40* 10/23/2013   AST 50* 10/23/2013   ALKPHOS 139* 10/23/2013   BILITOT 0.3 10/23/2013    Lab Results  Component Value Date   CHOL 186 10/23/2013   HDL 40 10/23/2013   LDLCALC 120* 10/23/2013   TRIG 132 10/23/2013   CHOLHDL 4.7 10/23/2013    Lab Results HIV 1 RNA Quant (copies/mL)  Date Value  05/29/2014 <20   10/23/2013  <20   04/03/2013 <20      CD4 T Cell Abs (/uL)  Date Value  05/29/2014 1180   10/23/2013 1360   04/03/2013 1140      Assessment: Her HIV infection remains under excellent control.  Her blood pressure is not well  controlled.  I encouraged her to go through with the plan to quit smoking cigarettes.  I will start her back on doxycycline for recurrent boils will go to the ED colonization regimen of intranasal mupirocin along with chlorhexidine and bathing.  Plan: 1. Continue Atripla 2. Establish primary care 3. Cigarette cessation counseling provided 4. Doxycycline 100 mg twice a day for 7 days 5. Mupirocin intranasal ointment twice daily for 5 days 6. Bathe with chlorhexidine 2-3 times weekly for the next month 7. Followup here after blood work in [place] months   Michel Bickers, MD [facility] for [place] [phone] pager   [phone] cell 06/11/2014, 11:54 AM

## 2014-07-05 ENCOUNTER — Other Ambulatory Visit: Payer: Self-pay | Admitting: Internal Medicine

## 2014-07-16 ENCOUNTER — Encounter: Payer: Self-pay | Admitting: *Deleted

## 2014-08-11 ENCOUNTER — Other Ambulatory Visit: Payer: Self-pay | Admitting: Internal Medicine

## 2014-09-05 ENCOUNTER — Telehealth: Payer: Self-pay

## 2014-09-05 NOTE — Telephone Encounter (Signed)
Patient is having another episode with boil left axilla.  She says it has been draining brownish/red fluid for one month now.   She is requesting treatment.  Please advise  Laverle Patter, RN

## 2014-09-08 ENCOUNTER — Other Ambulatory Visit: Payer: Self-pay | Admitting: Internal Medicine

## 2014-09-08 DIAGNOSIS — L0293 Carbuncle, unspecified: Secondary | ICD-10-CM

## 2014-09-08 DIAGNOSIS — L732 Hidradenitis suppurativa: Secondary | ICD-10-CM

## 2014-09-08 MED ORDER — DOXYCYCLINE HYCLATE 100 MG PO TABS: 100.0000 mg | ORAL_TABLET | Freq: Two times a day (BID) | ORAL | Status: DC

## 2014-09-08 NOTE — Telephone Encounter (Signed)
I reordered doxycycline.

## 2014-09-08 NOTE — Telephone Encounter (Signed)
Patient notified

## 2014-09-12 ENCOUNTER — Other Ambulatory Visit: Payer: Self-pay | Admitting: Internal Medicine

## 2014-09-12 DIAGNOSIS — B20 Human immunodeficiency virus [HIV] disease: Secondary | ICD-10-CM

## 2014-10-06 ENCOUNTER — Other Ambulatory Visit: Payer: Self-pay | Admitting: Internal Medicine

## 2014-10-08 ENCOUNTER — Telehealth: Payer: Self-pay | Admitting: *Deleted

## 2014-10-08 NOTE — Telephone Encounter (Signed)
"  COLD" symptoms x 3 weeks.  Was seen at [facility] and told that she had a viral illness.  Has tried OTC Coricidan, Theraflu, Alkaseltzer Cold Plus and Flonase.  Feels as though the symptoms move from nose to chest and back.  RN advised continuing with Flonase as prescribed, starting Zyrtec as per package instructions, generic Mucinex with or without cough supressant per the package instructions and drinking more fluids while awake, approximately 8 ounces every hour.  RN advised pt to go back to the UC if she begins having a fever or if the sputum increases in color, consistency and amount.  Pt verbalized back these suggestions.  RN asked the pt to call [facility] back after the holiday weekend, Monday, Dec. 28 to let [facility] know how she is doing.  She said she would call back.

## 2014-10-14 ENCOUNTER — Ambulatory Visit: Payer: Managed Care, Other (non HMO)

## 2014-10-14 ENCOUNTER — Telehealth: Payer: Self-pay | Admitting: *Deleted

## 2014-10-14 ENCOUNTER — Ambulatory Visit: Payer: Managed Care, Other (non HMO) | Admitting: Nurse Practitioner

## 2014-10-14 ENCOUNTER — Ambulatory Visit (INDEPENDENT_AMBULATORY_CARE_PROVIDER_SITE_OTHER): Payer: Managed Care, Other (non HMO) | Admitting: Family Medicine

## 2014-10-14 ENCOUNTER — Ambulatory Visit (INDEPENDENT_AMBULATORY_CARE_PROVIDER_SITE_OTHER): Payer: Managed Care, Other (non HMO)

## 2014-10-14 VITALS — BP 168/100 | HR 81 | Temp 98.0°F | Resp 18 | Ht 65.0 in | Wt 253.0 lb

## 2014-10-14 DIAGNOSIS — R0600 Dyspnea, unspecified: Secondary | ICD-10-CM

## 2014-10-14 DIAGNOSIS — D869 Sarcoidosis, unspecified: Secondary | ICD-10-CM

## 2014-10-14 DIAGNOSIS — I10 Essential (primary) hypertension: Secondary | ICD-10-CM

## 2014-10-14 DIAGNOSIS — R05 Cough: Secondary | ICD-10-CM

## 2014-10-14 DIAGNOSIS — Z87442 Personal history of urinary calculi: Secondary | ICD-10-CM

## 2014-10-14 DIAGNOSIS — R319 Hematuria, unspecified: Secondary | ICD-10-CM

## 2014-10-14 DIAGNOSIS — R059 Cough, unspecified: Secondary | ICD-10-CM

## 2014-10-14 DIAGNOSIS — R062 Wheezing: Secondary | ICD-10-CM

## 2014-10-14 DIAGNOSIS — M549 Dorsalgia, unspecified: Secondary | ICD-10-CM

## 2014-10-14 LAB — POCT URINALYSIS DIPSTICK
Glucose, UA: NEGATIVE
Ketones, UA: NEGATIVE
Nitrite, UA: NEGATIVE
Protein, UA: >=300
SPEC GRAV UA: 1.025
UROBILINOGEN UA: 0.2
pH, UA: 7

## 2014-10-14 LAB — POCT UA - MICROSCOPIC ONLY
Casts, Ur, LPF, POC: NEGATIVE
Crystals, Ur, HPF, POC: NEGATIVE
Mucus, UA: NEGATIVE
Yeast, UA: NEGATIVE

## 2014-10-14 MED ORDER — ALBUTEROL SULFATE HFA 108 (90 BASE) MCG/ACT IN AERS: 1.0000 | INHALATION_SPRAY | RESPIRATORY_TRACT | Status: DC | PRN

## 2014-10-14 MED ORDER — LEVOFLOXACIN 500 MG PO TABS: 500.0000 mg | ORAL_TABLET | Freq: Every day | ORAL | Status: DC

## 2014-10-14 MED ORDER — OXYCODONE-ACETAMINOPHEN 5-325 MG PO TABS: 1.0000 | ORAL_TABLET | Freq: Four times a day (QID) | ORAL | Status: DC | PRN

## 2014-10-14 NOTE — Progress Notes (Signed)
Subjective:    Patient ID: [name], female    DOB: July 18, 1966, 48 y.o.   MRN: [id number]  HPI [name] is a 48 y.o. female  Here for 2 concerns tonight:  Hematuria with back pain - started a month ago noticed blood in urine - red urine. Thought d/t ibuprofen she had taken the day before. Has noticed occasional low back pain over past month - comes and goes, but persistent/steady past 2 weeks. No fever.  No n/v. Rare lower abdominal pain, but usually back. Hx of renal nephroliths - staghorn calculous 2 years ago - hospitalized for 2 weeks. 5 surgeries.  Feel similar to prior symptoms - called urology - next available appt Jan 15th. No dysuria. No frequency/urgency.  Tx: none   Cough, with shortness of breath - cold symptoms initially in sinuses about a month ago. Improved after 1 week, then came back last week - nasal congestion. HA.  Fatigue and feeling winded with exertion. Sweats at times - has had this for sometime. Had fever last week - 101 5 days ago, none since then. Hx of sarcoidosis, but no flair in 1994.   Hx of HIV disease, takes Atripla, followed by Dr. [name].   HTN  - has not taken blood pressure medicine past 3 days - ran out, just has not picked up refills. No chest pains, no weakness, no slurred speech.   Lab Results  Component Value Date   CD4TCELL 37 05/29/2014   CD4TABS 1180 05/29/2014   Lab Results  Component Value Date   HIV1RNAQUANT <20 05/29/2014         Patient Active Problem List   Diagnosis Date Noted  . Recurrent boils 06/11/2014  . Upper respiratory infection, acute 11/05/2013  . Leg swelling 04/03/2013  . Abscess of breast, right 01/09/2013  . Breast abscess of female-right 12/18/2012  . Rash 02/16/2012  . Abscess 02/16/2012  . Vaginal Discharge 02/09/2012  . Hidradenitis suppurativa of right >> left axilla 01/30/2012  . 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 GRAVE'S DISEASE 01/01/2008  . SARCOIDOSIS 02/08/2007  .  THYROID NODULE, RIGHT 02/08/2007  . TOBACCO USER 02/08/2007  . 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 HIV DISEASE 07/24/2006  . DEPRESSION 07/24/2006  . HYPERTENSION 07/24/2006  . ASTHMA 07/24/2006  . GERD 07/24/2006   Past Medical History  Diagnosis Date  . Hyperthyroidism 08/2006    Grave's Disease -diffuse radiotracer uptake 08/25/06 Thyroid scan-Cold nodule to R lower lobe of thyrorid  . HIV DISEASE 07/24/2006    dx March 05  . Sarcoidosis 02/08/2007    dx as a teenager in [place] from abnl CXR. Completed 2 yrs Prednisone after lung bx confirmation. No symptoms since then.  . THYROID NODULE, RIGHT 02/08/2007  . GRAVE'S DISEASE 01/01/2008  . 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 HYPERTENSION 07/24/2006  . ASTHMA 07/24/2006  . GERD 07/24/2006  . 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 Anemia     Normocytic  . History of hidradenitis suppurativa   . Gastroenteritis 07/10/07  . Menometrorrhagia     hx of  . History of thrush   . Bronchitis 2005  . Pneumonia 2005  . Nephrolithiasis    Past Surgical History  Procedure Laterality Date  . Total thyroidectomy  2010  . [place]  1994  . Dilation and curettage of uterus  Feb 2004    s/p for 1st trimester nonviable pregnancy  . Incision and drainage peritonsillar abscess  Mar 03  . Incise and drain abcess  Nov 03    s/p I &D for righ inframmary fold hidradenitis  . Irrigation and debridement abscess  01/31/2012    Procedure: IRRIGATION AND DEBRIDEMENT ABSCESS;  Surgeon: Shann Medal, MD;  Location: [facility];  Service: General;  Laterality: Right;  right breast and axilla   . Eye surgery      sty under eyelid  . Nephrolithotomy  10/02/2012    Procedure: NEPHROLITHOTOMY PERCUTANEOUS;  Surgeon: Alexis Frock, MD;  Location: [facility];  Service: Urology;  Laterality: Right;  First Stage Percutaneous Nephrolithotomy with Surgeon Access, Left Ureteral Stent    . Cystoscopy with stent placement  10/02/2012    Procedure:  CYSTOSCOPY WITH STENT PLACEMENT;  Surgeon: Alexis Frock, MD;  Location: [facility];  Service: Urology;  Laterality: Left;  . Nephrolithotomy  10/04/2012    Procedure: NEPHROLITHOTOMY PERCUTANEOUS SECOND LOOK;  Surgeon: Alexis Frock, MD;  Location: [facility];  Service: Urology;  Laterality: Right;     . Nephrolithotomy  10/08/2012    Procedure: NEPHROLITHOTOMY PERCUTANEOUS;  Surgeon: Alexis Frock, MD;  Location: [facility];  Service: Urology;  Laterality: Right;  THIRD STAGE, nephrostomy tube exchange x 2  . Nephrolithotomy  10/11/2012    Procedure: NEPHROLITHOTOMY PERCUTANEOUS SECOND LOOK;  Surgeon: Alexis Frock, MD;  Location: [facility];  Service: Urology;  Laterality: Right;  RIGHT 4 STAGE PERCUTANOUS NEPHROLITHOTOMY, right URETEROSCOPY WITH HOLMIUM LASER   . Cystoscopy with retrograde pyelogram, ureteroscopy and stent placement  11/09/2012    Procedure: [place], URETEROSCOPY AND STENT PLACEMENT;  Surgeon: Alexis Frock, MD;  Location: [facility];  Service: Urology;  Laterality: Left;  LEFT URETEROSCOPY, STONE MANIPULATION, left STENT exchange   . Cystoscopy w/ ureteral stent removal  11/09/2012    Procedure: CYSTOSCOPY WITH STENT REMOVAL;  Surgeon: Alexis Frock, MD;  Location: [facility];  Service: Urology;  Laterality: Right;  . Breast surgery  1997    Breast Reduction    Allergies  Allergen Reactions  . Tape Rash    Rash   . Lisinopril Cough  . Xylocaine [Lidocaine]     Unknown , patient states she has had lidocaine for IV starts and at the dentist with no problems   Prior to Admission medications   Medication Sig Start Date End Date Taking? Authorizing Provider  ARTIFICIAL TEARS ophthalmic solution Place 2 drops into both eyes every 4 (four) hours as needed. For dry eyes   Yes Historical Provider, MD  ATRIPLA 600-200-300 MG per tablet take 1 tablet by mouth at bedtime 09/15/14  Yes Michel Bickers, MD  cetirizine (ZYRTEC) 10 MG tablet Take 10 mg by mouth daily.   Yes  Historical Provider, MD  fluticasone (FLONASE) 50 MCG/ACT nasal spray Place into both nostrils daily.   Yes Historical Provider, MD  hydrochlorothiazide (MICROZIDE) 12.5 MG capsule TAKE ONE CAPSULE BY MOUTH EVERY DAY 08/12/14  Yes Michel Bickers, MD  levothyroxine (SYNTHROID, LEVOTHROID) 200 MCG tablet TAKE 1 TABLET BY MOUTH DAILY. 10/06/14  Yes Michel Bickers, MD  albuterol (PROVENTIL HFA;VENTOLIN HFA) 108 (90 BASE) MCG/ACT inhaler Inhale 2 puffs into the lungs 4 (four) times daily as needed. For shortness of breath 03/08/11   Michel Bickers, MD  mupirocin ointment (BACTROBAN) 2 % Place 1 application into the nose 2 (two) times daily. Patient not taking: Reported on 10/14/2014 06/11/14   Michel Bickers, MD  NIFEdipine (PROCARDIA XL/ADALAT-CC) 60 MG 24 hr tablet TAKE 1 TABLET (60 MG TOTAL) BY MOUTH DAILY. Patient not taking: Reported on 10/14/2014  Michel Bickers, MD  oxyCODONE-acetaminophen (PERCOCET) 5-325 MG per tablet Take 1 tablet by mouth every 6 (six) hours as needed. Patient not taking: Reported on 10/14/2014 10/14/13   April K Palumbo-Rasch, MD  oxyCODONE-acetaminophen (PERCOCET/ROXICET) 5-325 MG per tablet Take 2 tablets by mouth every 4 (four) hours as needed for pain. Patient not taking: Reported on 10/14/2014 04/19/13   Domenic Moras, PA-C   History   Social History  . Marital Status: Single    Spouse Name: N/A    Number of Children: 1  . Years of Education: N/A   Occupational History  . [employer]   Social History Main Topics  . Smoking status: Current Some Day Smoker -- 0.50 packs/day for 15 years    Types: Cigarettes  . Smokeless tobacco: Never Used     Comment: will stop on 06/21/14  . Alcohol Use: No  . Drug Use: No  . Sexual Activity: Not Currently     Comment: Given condoms   Other Topics Concern  . Not on file   Social History Narrative     Review of Systems  Constitutional: Positive for fever.  Respiratory: Positive for cough, shortness of  breath (with exertion) and wheezing.   Cardiovascular: Negative for chest pain.  Gastrointestinal: Negative for nausea and vomiting.  Genitourinary: Positive for hematuria and flank pain. Negative for dysuria, urgency, frequency, decreased urine volume and difficulty urinating.  Musculoskeletal: Positive for back pain.  Skin: Negative for rash.       Objective:   Physical Exam  Constitutional: She is oriented to person, place, and time. She appears well-developed and well-nourished.  HENT:  Head: Normocephalic and atraumatic.  Cardiovascular: Normal rate, regular rhythm, normal heart sounds and intact distal pulses.  Exam reveals no gallop and no friction rub.   No murmur heard. Pulmonary/Chest: Effort normal. No stridor. No respiratory distress. She has wheezes (faint end exp). She has no rales. She exhibits no tenderness.  Abdominal: Soft. Normal appearance. She exhibits no distension. There is no tenderness. There is no rebound, no guarding and no CVA tenderness.  Neurological: She is alert and oriented to person, place, and time.  Skin: Skin is warm.  Psychiatric: She has a normal mood and affect. Her behavior is normal.  Vitals reviewed.  Filed Vitals:   10/14/14 1744  BP: 168/100  Pulse: 81  Temp: 98 F (36.7 C)  TempSrc: Oral  Resp: 18  Height: 5\' 5"  (1.651 m)  Weight: 253 lb (114.76 kg)  SpO2: 99%   Results for orders placed or performed in visit on 10/14/14  POCT urinalysis dipstick  Result Value Ref Range   Color, UA red    Clarity, UA turbid    Glucose, UA neg    Bilirubin, UA small    Ketones, UA neg    Spec Grav, UA 1.025    Blood, UA large    pH, UA 7.0    Protein, UA >=300    Urobilinogen, UA 0.2    Nitrite, UA neg    Leukocytes, UA large (3+)   POCT UA - Microscopic Only  Result Value Ref Range   WBC, Ur, HPF, POC 25-35    RBC, urine, microscopic TNTC    Bacteria, U Microscopic trace    Mucus, UA neg    Epithelial cells, urine per micros 2-4     Crystals, Ur, HPF, POC neg    Casts, Ur, LPF, POC neg    Yeast, UA neg  Peak flow 400, predicted 397  [facility] reading (PRIMARY) by  Dr. Carlota Raspberry. CXR: increased bronchitic markings with possible early RLL infiltrate.  Cardiomegaly.   Abdomen 1 view: 3cm and 2.3cm opacities on R lower abdomen.       Assessment & Plan:   [name] is a 48 y.o. female Hematuria -, History of kidney stones, Back pain, unspecified location levofloxacin (LEVAQUIN) 500 MG tablet, Ambulatory referral to Urology, POCT urinalysis dipstick, POCT UA - Microscopic Only, Urine culture, oxyCODONE-acetaminophen (ROXICET) 5-325 MG per tablet, Ambulatory referral to Urology- Plan: DG Abd 1 View, Basic metabolic panel, oxyCODONE-acetaminophen (ROXICET) 5-325 MG per tablet, Ambulatory referral to Urology  - staghorn calculi by history as above, with likely recurrence of symptomatic nephrolith.   -refer to urology, check BMP to eval renal function, and cover for possible infection with Levaquin until urine culture returns.   -percocet for pain, rtc precautions if worsening prior to urolog eval.   Cough, Dyspnea, hx of Sarcoidosis and Wheezing - Plan: Angiotensin converting enzyme,  albuterol (PROVENTIL HFA;VENTOLIN HFA) 108 (90 BASE) MCG/ACT inhaler  - early LRTI - bronchitis vs CAP with bronchospasm. Afebrile.   -cover with levaquin as above for combined urinary coverage as well.   -albuterol Q4-6h prn.   -ACE level with hx of sarcoidosis, but no recent flare and CXR does not appear to be sarcoid flare.   -rtc precautions.   Essential hypertension - Plan: Basic metabolic panel  -suspect elevation d/t pain. Cont procardia and HCTZ.    Meds ordered this encounter  Medications  . albuterol (PROVENTIL HFA;VENTOLIN HFA) 108 (90 BASE) MCG/ACT inhaler    Sig: Inhale 1-2 puffs into the lungs every 4 (four) hours as needed for wheezing or shortness of breath.    Dispense:  1 Inhaler    Refill:  0  . oxyCODONE-acetaminophen  (ROXICET) 5-325 MG per tablet    Sig: Take 1 tablet by mouth every 6 (six) hours as needed for severe pain.    Dispense:  20 tablet    Refill:  0  . levofloxacin (LEVAQUIN) 500 MG tablet    Sig: Take 1 tablet (500 mg total) by mouth daily.    Dispense:  7 tablet    Refill:  0   Patient Instructions  Restart your blood pressure medicine.  If blood pressures are not returning back to normal (under 140/90), then return here or your primary care provider.  Drink plenty of fluids, percocet if needed for pain, start antibiotic, and we will refer you to urologist. Return to the clinic or go to the nearest emergency room if any of your symptoms worsen or new symptoms occur. For cough and wheezing - same antibiotic, inhaler if needed. You should receive a call or letter about your lab results within the next week to 10 days.  Return to the clinic or go to the nearest emergency room if any of your symptoms worsen or new symptoms occur.  Cough, Adult  A cough is a reflex that helps clear your throat and airways. It can help heal the body or may be a reaction to an irritated airway. A cough may only last 2 or 3 weeks (acute) or may last more than 8 weeks (chronic).  CAUSES Acute cough:  Viral or bacterial infections. Chronic cough:  Infections.  Allergies.  Asthma.  Post-nasal drip.  Smoking.  Heartburn or acid reflux.  Some medicines.  Chronic lung problems (COPD).  Cancer. SYMPTOMS   Cough.  Fever.  Chest  pain.  Increased breathing rate.  High-pitched whistling sound when breathing (wheezing).  Colored mucus that you cough up (sputum). TREATMENT   A bacterial cough may be treated with antibiotic medicine.  A viral cough must run its course and will not respond to antibiotics.  Your caregiver may recommend other treatments if you have a chronic cough. HOME CARE INSTRUCTIONS   Only take over-the-counter or prescription medicines for pain, discomfort, or fever as  directed by your caregiver. Use cough suppressants only as directed by your caregiver.  Use a cold steam vaporizer or humidifier in your bedroom or home to help loosen secretions.  Sleep in a semi-upright position if your cough is worse at night.  Rest as needed.  Stop smoking if you smoke. SEEK IMMEDIATE MEDICAL CARE IF:   You have pus in your sputum.  Your cough starts to worsen.  You cannot control your cough with suppressants and are losing sleep.  You begin coughing up blood.  You have difficulty breathing.  You develop pain which is getting worse or is uncontrolled with medicine.  You have a fever. MAKE SURE YOU:   Understand these instructions.  Will watch your condition.  Will get help right away if you are not doing well or get worse. Document Released: 04/01/2011 Document Revised: 12/26/2011 Document Reviewed: 04/01/2011 [facility] Patient Information 2015 [place], [place]. This information is not intended to replace advice given to you by your health care provider. Make sure you discuss any questions you have with your health care provider.   Hematuria Hematuria is blood in your urine. It can be caused by a bladder infection, kidney infection, prostate infection, kidney stone, or cancer of your urinary tract. Infections can usually be treated with medicine, and a kidney stone usually will pass through your urine. If neither of these is the cause of your hematuria, further workup to find out the reason may be needed. It is very important that you tell your health care provider about any blood you see in your urine, even if the blood stops without treatment or happens without causing pain. Blood in your urine that happens and then stops and then happens again can be a symptom of a very serious condition. Also, pain is not a symptom in the initial stages of many urinary cancers. HOME CARE INSTRUCTIONS   Drink lots of fluid, 3-4 quarts a day. If you have been diagnosed  with an infection, cranberry juice is especially recommended, in addition to large amounts of water.  Avoid caffeine, tea, and carbonated beverages because they tend to irritate the bladder.  Avoid alcohol because it may irritate the prostate.  Take all medicines as directed by your health care provider.  If you were prescribed an antibiotic medicine, finish it all even if you start to feel better.  If you have been diagnosed with a kidney stone, follow your health care provider's instructions regarding straining your urine to catch the stone.  Empty your bladder often. Avoid holding urine for long periods of time.  After a bowel movement, women should cleanse front to back. Use each tissue only once.  Empty your bladder before and after sexual intercourse if you are a female. SEEK MEDICAL CARE IF:  You develop back pain.  You have a fever.  You have a feeling of sickness in your stomach (nausea) or vomiting.  Your symptoms are not better in 3 days. Return sooner if you are getting worse. SEEK IMMEDIATE MEDICAL CARE IF:   You develop  severe vomiting and are unable to keep the medicine down.  You develop severe back or abdominal pain despite taking your medicines.  You begin passing a large amount of blood or clots in your urine.  You feel extremely weak or faint, or you pass out. MAKE SURE YOU:   Understand these instructions.  Will watch your condition.  Will get help right away if you are not doing well or get worse. Document Released: 10/03/2005 Document Revised: 02/17/2014 Document Reviewed: 06/03/2013 [facility] Patient Information 2015 [place], [place]. This information is not intended to replace advice given to you by your health care provider. Make sure you discuss any questions you have with your health care provider.     I personally performed the services described in this documentation, which was scribed in my presence. The recorded information has been  reviewed and considered, and addended by me as needed.

## 2014-10-14 NOTE — Telephone Encounter (Signed)
Pt still having "cold" symptoms.  Pt has upcoming appt with new PCP, Roxy Cedar, NP.  RN advised pt to call [facility] for an emergent appt prior to PCP appt in January.  Pt agreed to call [employer].

## 2014-10-14 NOTE — Patient Instructions (Signed)
Restart your blood pressure medicine.  If blood pressures are not returning back to normal (under 140/90), then return here or your primary care provider.  Drink plenty of fluids, percocet if needed for pain, start antibiotic, and we will refer you to urologist. Return to the clinic or go to the nearest emergency room if any of your symptoms worsen or new symptoms occur. For cough and wheezing - same antibiotic, inhaler if needed. You should receive a call or letter about your lab results within the next week to 10 days.  Return to the clinic or go to the nearest emergency room if any of your symptoms worsen or new symptoms occur.  Cough, Adult  A cough is a reflex that helps clear your throat and airways. It can help heal the body or may be a reaction to an irritated airway. A cough may only last 2 or 3 weeks (acute) or may last more than 8 weeks (chronic).  CAUSES Acute cough:  Viral or bacterial infections. Chronic cough:  Infections.  Allergies.  Asthma.  Post-nasal drip.  Smoking.  Heartburn or acid reflux.  Some medicines.  Chronic lung problems (COPD).  Cancer. SYMPTOMS   Cough.  Fever.  Chest pain.  Increased breathing rate.  High-pitched whistling sound when breathing (wheezing).  Colored mucus that you cough up (sputum). TREATMENT   A bacterial cough may be treated with antibiotic medicine.  A viral cough must run its course and will not respond to antibiotics.  Your caregiver may recommend other treatments if you have a chronic cough. HOME CARE INSTRUCTIONS   Only take over-the-counter or prescription medicines for pain, discomfort, or fever as directed by your caregiver. Use cough suppressants only as directed by your caregiver.  Use a cold steam vaporizer or humidifier in your bedroom or home to help loosen secretions.  Sleep in a semi-upright position if your cough is worse at night.  Rest as needed.  Stop smoking if you smoke. SEEK IMMEDIATE  MEDICAL CARE IF:   You have pus in your sputum.  Your cough starts to worsen.  You cannot control your cough with suppressants and are losing sleep.  You begin coughing up blood.  You have difficulty breathing.  You develop pain which is getting worse or is uncontrolled with medicine.  You have a fever. MAKE SURE YOU:   Understand these instructions.  Will watch your condition.  Will get help right away if you are not doing well or get worse. Document Released: 04/01/2011 Document Revised: 12/26/2011 Document Reviewed: 04/01/2011 [facility] Patient Information 2015 [place], [place]. This information is not intended to replace advice given to you by your health care provider. Make sure you discuss any questions you have with your health care provider.   Hematuria Hematuria is blood in your urine. It can be caused by a bladder infection, kidney infection, prostate infection, kidney stone, or cancer of your urinary tract. Infections can usually be treated with medicine, and a kidney stone usually will pass through your urine. If neither of these is the cause of your hematuria, further workup to find out the reason may be needed. It is very important that you tell your health care provider about any blood you see in your urine, even if the blood stops without treatment or happens without causing pain. Blood in your urine that happens and then stops and then happens again can be a symptom of a very serious condition. Also, pain is not a symptom in the initial stages  of many urinary cancers. HOME CARE INSTRUCTIONS   Drink lots of fluid, 3-4 quarts a day. If you have been diagnosed with an infection, cranberry juice is especially recommended, in addition to large amounts of water.  Avoid caffeine, tea, and carbonated beverages because they tend to irritate the bladder.  Avoid alcohol because it may irritate the prostate.  Take all medicines as directed by your health care provider.  If  you were prescribed an antibiotic medicine, finish it all even if you start to feel better.  If you have been diagnosed with a kidney stone, follow your health care provider's instructions regarding straining your urine to catch the stone.  Empty your bladder often. Avoid holding urine for long periods of time.  After a bowel movement, women should cleanse front to back. Use each tissue only once.  Empty your bladder before and after sexual intercourse if you are a female. SEEK MEDICAL CARE IF:  You develop back pain.  You have a fever.  You have a feeling of sickness in your stomach (nausea) or vomiting.  Your symptoms are not better in 3 days. Return sooner if you are getting worse. SEEK IMMEDIATE MEDICAL CARE IF:   You develop severe vomiting and are unable to keep the medicine down.  You develop severe back or abdominal pain despite taking your medicines.  You begin passing a large amount of blood or clots in your urine.  You feel extremely weak or faint, or you pass out. MAKE SURE YOU:   Understand these instructions.  Will watch your condition.  Will get help right away if you are not doing well or get worse. Document Released: 10/03/2005 Document Revised: 02/17/2014 Document Reviewed: 06/03/2013 [facility] Patient Information 2015 [place], [place]. This information is not intended to replace advice given to you by your health care provider. Make sure you discuss any questions you have with your health care provider.

## 2014-10-15 LAB — BASIC METABOLIC PANEL
BUN: 17 mg/dL (ref 6–23)
CALCIUM: 9.6 mg/dL (ref 8.4–10.5)
CO2: 28 meq/L (ref 19–32)
CREATININE: 1.02 mg/dL (ref 0.50–1.10)
Chloride: 103 mEq/L (ref 96–112)
Glucose, Bld: 96 mg/dL (ref 70–99)
Potassium: 3.7 mEq/L (ref 3.5–5.3)
SODIUM: 140 meq/L (ref 135–145)

## 2014-10-15 LAB — ANGIOTENSIN CONVERTING ENZYME: ANGIOTENSIN-CONVERTING ENZYME: 62 U/L — AB (ref 8–52)

## 2014-10-17 LAB — URINE CULTURE: Colony Count: >=100000

## 2014-10-20 ENCOUNTER — Other Ambulatory Visit (HOSPITAL_COMMUNITY): Payer: Self-pay | Admitting: Urology

## 2014-10-20 DIAGNOSIS — N261 Atrophy of kidney (terminal): Secondary | ICD-10-CM

## 2014-10-21 ENCOUNTER — Emergency Department (HOSPITAL_COMMUNITY)
Admission: EM | Admit: 2014-10-21 | Discharge: 2014-10-22 | Disposition: A | Discharge status: Home / Self Care | Payer: PRIVATE HEALTH INSURANCE | Attending: Emergency Medicine | Admitting: Emergency Medicine

## 2014-10-21 ENCOUNTER — Encounter (HOSPITAL_COMMUNITY): Payer: Self-pay | Admitting: *Deleted

## 2014-10-21 DIAGNOSIS — J45909 Unspecified asthma, uncomplicated: Secondary | ICD-10-CM | POA: Insufficient documentation

## 2014-10-21 DIAGNOSIS — Z9089 Acquired absence of other organs: Secondary | ICD-10-CM | POA: Insufficient documentation

## 2014-10-21 DIAGNOSIS — Z79899 Other long term (current) drug therapy: Secondary | ICD-10-CM | POA: Insufficient documentation

## 2014-10-21 DIAGNOSIS — Z8701 Personal history of pneumonia (recurrent): Secondary | ICD-10-CM | POA: Diagnosis not present

## 2014-10-21 DIAGNOSIS — E890 Postprocedural hypothyroidism: Secondary | ICD-10-CM | POA: Diagnosis not present

## 2014-10-21 DIAGNOSIS — R05 Cough: Secondary | ICD-10-CM | POA: Insufficient documentation

## 2014-10-21 DIAGNOSIS — Z862 Personal history of diseases of the blood and blood-forming organs and certain disorders involving the immune mechanism: Secondary | ICD-10-CM | POA: Insufficient documentation

## 2014-10-21 DIAGNOSIS — Z872 Personal history of diseases of the skin and subcutaneous tissue: Secondary | ICD-10-CM | POA: Insufficient documentation

## 2014-10-21 DIAGNOSIS — K219 Gastro-esophageal reflux disease without esophagitis: Secondary | ICD-10-CM | POA: Insufficient documentation

## 2014-10-21 DIAGNOSIS — Z87442 Personal history of urinary calculi: Secondary | ICD-10-CM | POA: Diagnosis not present

## 2014-10-21 DIAGNOSIS — Z792 Long term (current) use of antibiotics: Secondary | ICD-10-CM | POA: Diagnosis not present

## 2014-10-21 DIAGNOSIS — Z3202 Encounter for pregnancy test, result negative: Secondary | ICD-10-CM | POA: Diagnosis not present

## 2014-10-21 DIAGNOSIS — Z21 Asymptomatic human immunodeficiency virus [HIV] infection status: Secondary | ICD-10-CM | POA: Diagnosis not present

## 2014-10-21 DIAGNOSIS — Z720 Tobacco use: Secondary | ICD-10-CM | POA: Diagnosis not present

## 2014-10-21 DIAGNOSIS — M545 Low back pain: Secondary | ICD-10-CM | POA: Diagnosis not present

## 2014-10-21 DIAGNOSIS — Z8619 Personal history of other infectious and parasitic diseases: Secondary | ICD-10-CM | POA: Insufficient documentation

## 2014-10-21 DIAGNOSIS — Z7951 Long term (current) use of inhaled steroids: Secondary | ICD-10-CM | POA: Insufficient documentation

## 2014-10-21 DIAGNOSIS — R509 Fever, unspecified: Secondary | ICD-10-CM | POA: Diagnosis not present

## 2014-10-21 DIAGNOSIS — I10 Essential (primary) hypertension: Secondary | ICD-10-CM | POA: Insufficient documentation

## 2014-10-21 DIAGNOSIS — R109 Unspecified abdominal pain: Secondary | ICD-10-CM | POA: Diagnosis not present

## 2014-10-21 MED ORDER — METHOCARBAMOL 500 MG PO TABS
1000.0000 mg | ORAL_TABLET | Freq: Once | ORAL | Status: AC
Start: 2014-10-21 — End: 2014-10-22
  Administered 2014-10-22: 1000 mg via ORAL
  Filled 2014-10-21: qty 2

## 2014-10-21 MED ORDER — KETOROLAC TROMETHAMINE 60 MG/2ML IM SOLN
60.0000 mg | Freq: Once | INTRAMUSCULAR | Status: AC
  Administered 2014-10-22: 60 mg via INTRAMUSCULAR
  Filled 2014-10-21: qty 2

## 2014-10-21 NOTE — ED Notes (Signed)
Patient went to PCP prior to New Years for c/o cold symptoms U/A was done at PCP because patient had c/o blood in urine Patient with hx of renal stones and lithotripsy x 5, per patient Patient went to Urologist yesterday, but then patient states that the only time Urology could see her was the 15th of January???? Patient supposed to have "kidney testing" on January 15th Per patient, "I'm on Oxycotins but I can't take the pain no more."

## 2014-10-21 NOTE — ED Provider Notes (Signed)
CSN: [id number]     Arrival date & time 10/21/14  2303 History   First MD Initiated Contact with Patient 10/21/14 2321     Chief Complaint  Patient presents with  . Flank Pain  . Nephrolithiasis     (Consider location/radiation/quality/duration/timing/severity/associated sxs/prior Treatment) HPI Patient with previous history of staghorn calculi requiring multiple interventions 2 years ago. States that for the past month she had episodic left-sided low back pain that radiates to her side. This is worse with movement. No radiation of the leg. No nausea or vomiting. Since was seen by her primary doctor and diagnosed with possible bronchitis and UTI. Noted to have blood in the urine at that time. She is referred to urology. Patient states she had a CT scan showing recurrence of her renal calculi on the right. Patient is set up to have a renal imaging flow study. She continues to take oxycodone for pain. She states that the pain is controlled but then returns. She started with medication. Patient states that earlier in the week she did have fever up to 101 though currently is having no fever or chills. She has no focal weakness or numbness. She continues to have blood in the urine though this improved. She denies any dysuria, frequency or hesitancy. No urinary retention. Past Medical History  Diagnosis Date  . Hyperthyroidism 08/2006    Grave's Disease -diffuse radiotracer uptake 08/25/06 Thyroid scan-Cold nodule to R lower lobe of thyrorid  . HIV DISEASE 07/24/2006    dx March 05  . Sarcoidosis 02/08/2007    dx as a teenager in [place] from abnl CXR. Completed 2 yrs Prednisone after lung bx confirmation. No symptoms since then.  . THYROID NODULE, RIGHT 02/08/2007  . GRAVE'S DISEASE 01/01/2008  . 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 HYPERTENSION 07/24/2006  . ASTHMA 07/24/2006  . GERD 07/24/2006  . 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 Anemia     Normocytic  . History of hidradenitis  suppurativa   . Gastroenteritis 07/10/07  . Menometrorrhagia     hx of  . History of thrush   . Bronchitis 2005  . Pneumonia 2005  . Nephrolithiasis    Past Surgical History  Procedure Laterality Date  . Total thyroidectomy  2010  . [place]  1994  . Dilation and curettage of uterus  Feb 2004    s/p for 1st trimester nonviable pregnancy  . Incision and drainage peritonsillar abscess  Mar 03  . Incise and drain abcess  Nov 03    s/p I &D for righ inframmary fold hidradenitis  . Irrigation and debridement abscess  01/31/2012    Procedure: IRRIGATION AND DEBRIDEMENT ABSCESS;  Surgeon: Shann Medal, MD;  Location: [facility];  Service: General;  Laterality: Right;  right breast and axilla   . Eye surgery      sty under eyelid  . Nephrolithotomy  10/02/2012    Procedure: NEPHROLITHOTOMY PERCUTANEOUS;  Surgeon: Alexis Frock, MD;  Location: [facility];  Service: Urology;  Laterality: Right;  First Stage Percutaneous Nephrolithotomy with Surgeon Access, Left Ureteral Stent    . Cystoscopy with stent placement  10/02/2012    Procedure: CYSTOSCOPY WITH STENT PLACEMENT;  Surgeon: Alexis Frock, MD;  Location: [facility];  Service: Urology;  Laterality: Left;  . Nephrolithotomy  10/04/2012    Procedure: NEPHROLITHOTOMY PERCUTANEOUS SECOND LOOK;  Surgeon: Alexis Frock, MD;  Location: [facility];  Service: Urology;  Laterality: Right;     . Nephrolithotomy  10/08/2012    Procedure: NEPHROLITHOTOMY PERCUTANEOUS;  Surgeon: Alexis Frock, MD;  Location: [facility];  Service: Urology;  Laterality: Right;  THIRD STAGE, nephrostomy tube exchange x 2  . Nephrolithotomy  10/11/2012    Procedure: NEPHROLITHOTOMY PERCUTANEOUS SECOND LOOK;  Surgeon: Alexis Frock, MD;  Location: [facility];  Service: Urology;  Laterality: Right;  RIGHT 4 STAGE PERCUTANOUS NEPHROLITHOTOMY, right URETEROSCOPY WITH HOLMIUM LASER   . Cystoscopy with retrograde pyelogram, ureteroscopy and stent placement  11/09/2012    Procedure: Emily, URETEROSCOPY AND STENT PLACEMENT;  Surgeon: Alexis Frock, MD;  Location: [facility];  Service: Urology;  Laterality: Left;  LEFT URETEROSCOPY, STONE MANIPULATION, left STENT exchange   . Cystoscopy w/ ureteral stent removal  11/09/2012    Procedure: CYSTOSCOPY WITH STENT REMOVAL;  Surgeon: Alexis Frock, MD;  Location: [facility];  Service: Urology;  Laterality: Right;  . Breast surgery  1997    Breast Reduction    Family History  Problem Relation Age of Onset  . Cancer Other     FH of Breast Cancer  . Cancer Other     Fh of Lung Cancer  . Heart disease Other   . Hypertension Other   . Stroke Other     Grandparent  . Kidney disease Other     Grandparent  . Diabetes Other     FH of Diabetes  . Hypertension Mother   . Cancer Mother     throat  . Hypertension Father   . Hypertension Sister   . Cancer Maternal Uncle     Stomach Cancer   History  Substance Use Topics  . Smoking status: Current Some Day Smoker -- 0.50 packs/day for 15 years    Types: Cigarettes  . Smokeless tobacco: Never Used     Comment: will stop on 06/21/14  . Alcohol Use: No   OB History    No data available     Review of Systems  Constitutional: Positive for fever. Negative for chills.  Respiratory: Positive for cough. Negative for shortness of breath.   Cardiovascular: Negative for chest pain.  Gastrointestinal: Negative for nausea, vomiting, abdominal pain and diarrhea.  Genitourinary: Positive for hematuria. Negative for dysuria, flank pain and pelvic pain.  Musculoskeletal: Positive for back pain. Negative for myalgias, neck pain and neck stiffness.  Skin: Negative for rash and wound.  Neurological: Negative for dizziness, weakness, light-headedness, numbness and headaches.  All other systems reviewed and are negative.     Allergies  Tape; Lisinopril; and Xylocaine  Home Medications   Prior to Admission medications   Medication Sig Start Date End Date Taking? Authorizing  Provider  acetaminophen (TYLENOL) 500 MG tablet Take 1,000 mg by mouth every 6 (six) hours as needed for mild pain.   Yes Historical Provider, MD  ATRIPLA [id number] MG per tablet take 1 tablet by mouth at bedtime 09/15/14  Yes Michel Bickers, MD  fluticasone [facility]) 50 MCG/ACT nasal spray Place into both nostrils daily.   Yes Historical Provider, MD  hydrochlorothiazide (MICROZIDE) 12.5 MG capsule Take 12.5 mg by mouth daily.   Yes Historical Provider, MD  levothyroxine (SYNTHROID, LEVOTHROID) 200 MCG tablet Take 200 mcg by mouth daily before breakfast.   Yes Historical Provider, MD  NIFEdipine (PROCARDIA XL/ADALAT-CC) 60 MG 24 hr tablet TAKE 1 TABLET (60 MG TOTAL) BY MOUTH DAILY.   Yes Michel Bickers, MD  oxyCODONE-acetaminophen (ROXICET) 5-325 MG per tablet Take 1 tablet by mouth every 6 (six) hours as needed for severe pain. 10/14/14  Yes Wendie Agreste, MD  albuterol (PROVENTIL HFA;VENTOLIN HFA) 108 (90 BASE) MCG/ACT inhaler Inhale 1-2 puffs into the lungs every 4 (four) hours as needed for wheezing or shortness of breath. Patient not taking: Reported on 10/21/2014 10/14/14   Wendie Agreste, MD  cephALEXin (KEFLEX) 500 MG capsule Take 1 capsule (500 mg total) by mouth 2 (two) times daily. 10/22/14   Julianne Rice, MD  hydrochlorothiazide (MICROZIDE) 12.5 MG capsule TAKE ONE CAPSULE BY MOUTH EVERY DAY Patient not taking: Reported on 10/21/2014 08/12/14   Michel Bickers, MD  ketorolac (TORADOL) 10 MG tablet Take 1 tablet (10 mg total) by mouth every 6 (six) hours as needed. 10/22/14   Julianne Rice, MD  levofloxacin (LEVAQUIN) 500 MG tablet Take 1 tablet (500 mg total) by mouth daily. 10/14/14   Wendie Agreste, MD  levothyroxine (SYNTHROID, LEVOTHROID) 200 MCG tablet TAKE 1 TABLET BY MOUTH DAILY. Patient not taking: Reported on 10/21/2014 10/06/14   Michel Bickers, MD  methocarbamol (ROBAXIN) 500 MG tablet Take 2 tablets (1,000 mg total) by mouth every 8 (eight) hours as needed for muscle spasms.  10/22/14   Julianne Rice, MD   BP 163/86 mmHg  Pulse 72  Temp(Src) 98 F (36.7 C) (Oral)  Resp 16  SpO2 92%  LMP  Physical Exam  Constitutional: She is oriented to person, place, and time. She appears well-developed and well-nourished. No distress.  Patient is well-appearing.  HENT:  Head: Normocephalic and atraumatic.  Mouth/Throat: Oropharynx is clear and moist.  Eyes: EOM are normal. Pupils are equal, round, and reactive to light.  Neck: Normal range of motion. Neck supple.  Cardiovascular: Normal rate and regular rhythm.   Pulmonary/Chest: Effort normal and breath sounds normal. No respiratory distress. She has no wheezes. She has no rales. She exhibits no tenderness.  Abdominal: Soft. Bowel sounds are normal. She exhibits no distension and no mass. There is no tenderness. There is no rebound and no guarding.  Musculoskeletal: Normal range of motion. She exhibits no edema or tenderness.  No CVA tenderness bilaterally. Patient does have mild left-sided lower lumbar paraspinal tenderness. Pain is worse with movement. No midline T/L tenderness.  Neurological: She is alert and oriented to person, place, and time.  5/5 motor in all extremities. Sensation is intact.  Skin: Skin is warm and dry. No rash noted. No erythema.  Psychiatric: She has a normal mood and affect. Her behavior is normal.  Nursing note and vitals reviewed.   ED Course  Procedures (including critical care time) Labs Review Labs Reviewed  CBC WITH DIFFERENTIAL - Abnormal; Notable for the following:    WBC 11.2 (*)    RDW 16.3 (*)    All other components within normal limits  COMPREHENSIVE METABOLIC PANEL - Abnormal; Notable for the following:    Potassium 3.1 (*)    Glucose, Bld 131 (*)    Creatinine, Ser 1.18 (*)    AST 71 (*)    ALT 58 (*)    Alkaline Phosphatase 154 (*)    GFR calc non Af Amer 54 (*)    GFR calc Af Amer 63 (*)    All other components within normal limits  URINALYSIS, ROUTINE W REFLEX  MICROSCOPIC - Abnormal; Notable for the following:    APPearance CLOUDY (*)    Hgb urine dipstick LARGE (*)    Protein, ur 100 (*)    Leukocytes, UA LARGE (*)    All other components within normal limits  PREGNANCY, URINE  URINE MICROSCOPIC-ADD ON    Imaging  Review Ct Abdomen Pelvis W Contrast  10/22/2014   CLINICAL DATA:  Left flank pain. White cell count 11.2. White and red cells in urine. Previous history of kidney stones, gastroenteritis, hypertension, HIV positive.  EXAM: CT ABDOMEN AND PELVIS WITH CONTRAST  TECHNIQUE: Multidetector CT imaging of the abdomen and pelvis was performed using the standard protocol following bolus administration of intravenous contrast.  CONTRAST:  184mL OMNIPAQUE IOHEXOL 300 MG/ML  SOLN  COMPARISON:  10/16/2014  FINDINGS: The lung bases are clear.  The liver, spleen, gallbladder, pancreas, inferior vena cava, and abdominal aorta are unremarkable. Mild calcification in the iliac arteries. Mild prominence of retroperitoneal lymph nodes similar to previous study. These are possibly related to HIV or reactive process. Diffuse enlargement of the left adrenal gland suggesting hyperplasia. Right adrenal gland is unremarkable. Large staghorn calculus filling the right upper and lower pole collecting system. No hydronephrosis in either kidney. Nephrograms are symmetrical. Stomach and small bowel are decompressed. Diffusely stool-filled colon without abnormal distention or wall thickening. No free air or free fluid in the abdomen.  Pelvis: No pelvic mass lesions. Bladder wall is not thickened. No intraluminal filling defects demonstrated in the bladder. Appendix is normal. No free or loculated pelvic fluid collections. Prominent lymph nodes in the iliac chains bilaterally as well as in the groin regions. These are likely due to HIV or reactive process. Degenerative changes suggested at the SI joints. No destructive bone lesions appreciated. Normal alignment of the lumbar spine   IMPRESSION: Staghorn calculi in the right kidney. No evidence of renal or ureteral obstruction. No bladder wall thickening. Prominent abdominal and pelvic lymph nodes without pathologic enlargement, likely related to HIV or reactive process.   Electronically Signed   By: Lucienne Capers M.D.   On: 10/22/2014 05:28     EKG Interpretation None      MDM   Final diagnoses:  Left flank pain    Patient appears very typical for renal colic. It is worse with movement and palpation. She has a normal neurologic exam. No midline tenderness. Patient had noncontrast CT scan yesterday revealing renal calculi on the right but not on the left. Pain sounds more musculoskeletal. We'll treat and recheck labs and urine. Anticipate discharge home to follow-up with her urologist.  Patient with what appears to be possibly worsening urinary tract infection. She states that her pain is improved with the Robaxin and Toradol. However given her flank pain and urinary tract infection and believe CT is necessary to rule out pyelonephritis.  Renal calculi or once again seen in the right kidney the left kidney without any obvious abnormality. Patient needs to be symptom-free. We'll discharge home to follow-up with the urologist. She's been given return precautions and is voiced understanding.  Julianne Rice, MD 10/23/14 [phone]

## 2014-10-22 ENCOUNTER — Encounter (HOSPITAL_COMMUNITY): Payer: Self-pay

## 2014-10-22 ENCOUNTER — Emergency Department (HOSPITAL_COMMUNITY): Payer: PRIVATE HEALTH INSURANCE

## 2014-10-22 LAB — COMPREHENSIVE METABOLIC PANEL
ALBUMIN: 3.6 g/dL (ref 3.5–5.2)
ALT: 58 U/L — AB (ref 0–35)
ANION GAP: 9 (ref 5–15)
AST: 71 U/L — ABNORMAL HIGH (ref 0–37)
Alkaline Phosphatase: 154 U/L — ABNORMAL HIGH (ref 39–117)
BILIRUBIN TOTAL: 0.4 mg/dL (ref 0.3–1.2)
BUN: 21 mg/dL (ref 6–23)
CHLORIDE: 101 meq/L (ref 96–112)
CO2: 28 mmol/L (ref 19–32)
Calcium: 9.2 mg/dL (ref 8.4–10.5)
Creatinine, Ser: 1.18 mg/dL — ABNORMAL HIGH (ref 0.50–1.10)
GFR calc Af Amer: 63 mL/min — ABNORMAL LOW (ref >90)
GFR, EST NON AFRICAN AMERICAN: 54 mL/min — AB (ref >90)
Glucose, Bld: 131 mg/dL — ABNORMAL HIGH (ref 70–99)
Potassium: 3.1 mmol/L — ABNORMAL LOW (ref 3.5–5.1)
SODIUM: 138 mmol/L (ref 135–145)
TOTAL PROTEIN: 7.9 g/dL (ref 6.0–8.3)

## 2014-10-22 LAB — PREGNANCY, URINE: PREG TEST UR: NEGATIVE

## 2014-10-22 LAB — CBC WITH DIFFERENTIAL/PLATELET
BASOS ABS: 0 10*3/uL (ref 0.0–0.1)
Basophils Relative: 0 % (ref 0–1)
EOS ABS: 0.2 10*3/uL (ref 0.0–0.7)
EOS PCT: 2 % (ref 0–5)
HCT: 40 % (ref 36.0–46.0)
HEMOGLOBIN: 13.1 g/dL (ref 12.0–15.0)
Lymphocytes Relative: 34 % (ref 12–46)
Lymphs Abs: 3.9 10*3/uL (ref 0.7–4.0)
MCH: 28.6 pg (ref 26.0–34.0)
MCHC: 32.8 g/dL (ref 30.0–36.0)
MCV: 87.3 fL (ref 78.0–100.0)
MONO ABS: 0.7 10*3/uL (ref 0.1–1.0)
Monocytes Relative: 7 % (ref 3–12)
NEUTROS ABS: 6.4 10*3/uL (ref 1.7–7.7)
Neutrophils Relative %: 57 % (ref 43–77)
Platelets: 388 10*3/uL (ref 150–400)
RBC: 4.58 MIL/uL (ref 3.87–5.11)
RDW: 16.3 % — ABNORMAL HIGH (ref 11.5–15.5)
WBC: 11.2 10*3/uL — ABNORMAL HIGH (ref 4.0–10.5)

## 2014-10-22 LAB — URINALYSIS, ROUTINE W REFLEX MICROSCOPIC
BILIRUBIN URINE: NEGATIVE
Glucose, UA: NEGATIVE mg/dL
KETONES UR: NEGATIVE mg/dL
Nitrite: NEGATIVE
PROTEIN: 100 mg/dL — AB
Specific Gravity, Urine: 1.014 (ref 1.005–1.030)
Urobilinogen, UA: 0.2 mg/dL (ref 0.0–1.0)
pH: 6 (ref 5.0–8.0)

## 2014-10-22 LAB — URINE MICROSCOPIC-ADD ON

## 2014-10-22 MED ORDER — KETOROLAC TROMETHAMINE 10 MG PO TABS: 10.0000 mg | ORAL_TABLET | Freq: Four times a day (QID) | ORAL | Status: DC | PRN

## 2014-10-22 MED ORDER — METHOCARBAMOL 500 MG PO TABS: 1000.0000 mg | ORAL_TABLET | Freq: Three times a day (TID) | ORAL | Status: DC | PRN

## 2014-10-22 MED ORDER — IOHEXOL 300 MG/ML  SOLN
50.0000 mL | Freq: Once | INTRAMUSCULAR | Status: AC | PRN
  Administered 2014-10-22: 50 mL via ORAL

## 2014-10-22 MED ORDER — IOHEXOL 300 MG/ML  SOLN
100.0000 mL | Freq: Once | INTRAMUSCULAR | Status: AC | PRN
  Administered 2014-10-22: 100 mL via INTRAVENOUS

## 2014-10-22 MED ORDER — CEPHALEXIN 500 MG PO CAPS: 500.0000 mg | ORAL_CAPSULE | Freq: Two times a day (BID) | ORAL | Status: DC

## 2014-10-22 MED ORDER — DEXTROSE 5 % IV SOLN
1.0000 g | Freq: Once | INTRAVENOUS | Status: AC
  Administered 2014-10-22: 1 g via INTRAVENOUS
  Filled 2014-10-22: qty 10

## 2014-10-22 NOTE — ED Notes (Signed)
Bedside report received from previous RN, Abbie.

## 2014-10-22 NOTE — Discharge Instructions (Signed)
Flank Pain °Flank pain refers to pain that is located on the side of the body between the upper abdomen and the back. The pain may occur over a short period of time (acute) or may be long-term or reoccurring (chronic). It may be mild or severe. Flank pain can be caused by many things. °CAUSES  °Some of the more common causes of flank pain include: °· Muscle strains.   °· Muscle spasms.   °· A disease of your spine (vertebral disk disease).   °· A lung infection (pneumonia).   °· Fluid around your lungs (pulmonary edema).   °· A kidney infection.   °· Kidney stones.   °· A very painful skin rash caused by the chickenpox virus (shingles).   °· Gallbladder disease.   °HOME CARE INSTRUCTIONS  °Home care will depend on the cause of your pain. In general, °· Rest as directed by your caregiver. °· Drink enough fluids to keep your urine clear or pale yellow. °· Only take over-the-counter or prescription medicines as directed by your caregiver. Some medicines may help relieve the pain. °· Tell your caregiver about any changes in your pain. °· Follow up with your caregiver as directed. °SEEK IMMEDIATE MEDICAL CARE IF:  °· Your pain is not controlled with medicine.   °· You have new or worsening symptoms. °· Your pain increases.   °· You have abdominal pain.   °· You have shortness of breath.   °· You have persistent nausea or vomiting.   °· You have swelling in your abdomen.   °· You feel faint or pass out.   °· You have blood in your urine. °· You have a fever or persistent symptoms for more than 2-3 days. °· You have a fever and your symptoms suddenly get worse. °MAKE SURE YOU:  °· Understand these instructions. °· Will watch your condition. °· Will get help right away if you are not doing well or get worse. °Document Released: 11/24/2005 Document Revised: 06/27/2012 Document Reviewed: 05/17/2012 °ExitCare® Patient Information ©2015 [employer]. This information is not intended to replace advice given to you by your  health care provider. Make sure you discuss any questions you have with your health care provider. ° °

## 2014-10-29 ENCOUNTER — Encounter: Payer: Self-pay | Admitting: Family

## 2014-10-29 ENCOUNTER — Ambulatory Visit (INDEPENDENT_AMBULATORY_CARE_PROVIDER_SITE_OTHER): Payer: PRIVATE HEALTH INSURANCE | Admitting: Family

## 2014-10-29 VITALS — BP 144/88 | HR 86 | Temp 97.9°F | Ht 66.0 in | Wt 254.9 lb

## 2014-10-29 DIAGNOSIS — I10 Essential (primary) hypertension: Secondary | ICD-10-CM

## 2014-10-29 DIAGNOSIS — N200 Calculus of kidney: Secondary | ICD-10-CM

## 2014-10-29 DIAGNOSIS — Z1239 Encounter for other screening for malignant neoplasm of breast: Secondary | ICD-10-CM

## 2014-10-29 DIAGNOSIS — E039 Hypothyroidism, unspecified: Secondary | ICD-10-CM

## 2014-10-29 DIAGNOSIS — B20 Human immunodeficiency virus [HIV] disease: Secondary | ICD-10-CM

## 2014-10-29 DIAGNOSIS — E876 Hypokalemia: Secondary | ICD-10-CM

## 2014-10-29 DIAGNOSIS — J4520 Mild intermittent asthma, uncomplicated: Secondary | ICD-10-CM

## 2014-10-29 DIAGNOSIS — Z1231 Encounter for screening mammogram for malignant neoplasm of breast: Secondary | ICD-10-CM

## 2014-10-29 HISTORY — DX: Calculus of kidney: N20.0

## 2014-10-29 LAB — COMPREHENSIVE METABOLIC PANEL
ALBUMIN: 3.8 g/dL (ref 3.5–5.2)
ALK PHOS: 159 U/L — AB (ref 39–117)
ALT: 49 U/L — ABNORMAL HIGH (ref 0–35)
AST: 56 U/L — AB (ref 0–37)
BUN: 18 mg/dL (ref 6–23)
CALCIUM: 9.7 mg/dL (ref 8.4–10.5)
CO2: 26 mEq/L (ref 19–32)
CREATININE: 0.84 mg/dL (ref 0.40–1.20)
Chloride: 107 mEq/L (ref 96–112)
GFR: 93.68 mL/min (ref >60.00)
GLUCOSE: 85 mg/dL (ref 70–99)
Potassium: 3.3 mEq/L — ABNORMAL LOW (ref 3.5–5.1)
Sodium: 140 mEq/L (ref 135–145)
TOTAL PROTEIN: 8.6 g/dL — AB (ref 6.0–8.3)
Total Bilirubin: 0.3 mg/dL (ref 0.2–1.2)

## 2014-10-29 LAB — TSH: TSH: 5.71 u[IU]/mL — AB (ref 0.35–4.50)

## 2014-10-29 MED ORDER — ALBUTEROL SULFATE HFA 108 (90 BASE) MCG/ACT IN AERS: 2.0000 | INHALATION_SPRAY | Freq: Four times a day (QID) | RESPIRATORY_TRACT | Status: DC | PRN

## 2014-10-29 NOTE — Patient Instructions (Signed)
Hypokalemia °Hypokalemia means that the amount of potassium in the blood is lower than normal. Potassium is a chemical, called an electrolyte, that helps regulate the amount of fluid in the body. It also stimulates muscle contraction and helps nerves function properly. Most of the body's potassium is inside of cells, and only a very small amount is in the blood. Because the amount in the blood is so small, minor changes can be life-threatening. °CAUSES °· Antibiotics. °· Diarrhea or vomiting. °· Using laxatives too much, which can cause diarrhea. °· Chronic kidney disease. °· Water pills (diuretics). °· Eating disorders (bulimia). °· Low magnesium level. °· Sweating a lot. °SIGNS AND SYMPTOMS °· Weakness. °· Constipation. °· Fatigue. °· Muscle cramps. °· Mental confusion. °· Skipped heartbeats or irregular heartbeat (palpitations). °· Tingling or numbness. °DIAGNOSIS  °Your health care provider can diagnose hypokalemia with blood tests. In addition to checking your potassium level, your health care provider may also check other lab tests. °TREATMENT °Hypokalemia can be treated with potassium supplements taken by mouth or adjustments in your current medicines. If your potassium level is very low, you may need to get potassium through a vein (IV) and be monitored in the hospital. A diet high in potassium is also helpful. Foods high in potassium are: °· Nuts, such as peanuts and pistachios. °· Seeds, such as sunflower seeds and pumpkin seeds. °· Peas, lentils, and lima beans. °· Whole grain and bran cereals and breads. °· Fresh fruit and vegetables, such as apricots, avocado, bananas, cantaloupe, kiwi, oranges, tomatoes, asparagus, and potatoes. °· Orange and tomato juices. °· Red meats. °· Fruit yogurt. °HOME CARE INSTRUCTIONS °· Take all medicines as prescribed by your health care provider. °· Maintain a healthy diet by including nutritious food, such as fruits, vegetables, nuts, whole grains, and lean meats. °· If  you are taking a laxative, be sure to follow the directions on the label. °SEEK MEDICAL CARE IF: °· Your weakness gets worse. °· You feel your heart pounding or racing. °· You are vomiting or having diarrhea. °· You are diabetic and having trouble keeping your blood glucose in the normal range. °SEEK IMMEDIATE MEDICAL CARE IF: °· You have chest pain, shortness of breath, or dizziness. °· You are vomiting or having diarrhea for more than 2 days. °· You faint. °MAKE SURE YOU:  °· Understand these instructions. °· Will watch your condition. °· Will get help right away if you are not doing well or get worse. °Document Released: 10/03/2005 Document Revised: 07/24/2013 Document Reviewed: 04/05/2013 °Exit[employer]® Patient Information ©2015 [employer]. This information is not intended to replace advice given to you by your health care provider. Make sure you discuss any questions you have with your health care provider. ° °

## 2014-10-29 NOTE — Progress Notes (Signed)
Pre visit review using our clinic review tool, if applicable. No additional management support is needed unless otherwise documented below in the visit note. 

## 2014-10-30 ENCOUNTER — Encounter: Payer: Self-pay | Admitting: Family

## 2014-10-30 ENCOUNTER — Telehealth: Payer: Self-pay | Admitting: Family

## 2014-10-30 NOTE — Telephone Encounter (Signed)
emmi emailed °

## 2014-10-30 NOTE — Progress Notes (Signed)
Subjective:    Patient ID: SIRENITY GILSDORF, female    DOB: 1966-08-22, 49 y.o.   MRN: [id number]  HPI  49 year old AAF, smoker, new patient is in to be established. She has a history of HTN, Renal Calculi, Asthma, Hypothyroidism, Hypokalemia, and HIV Disease. She is under the care of infectious disease for management of HIV. She contracted the disease through heterosexual contact with an IV drug user in 2005. She denies any drug use now or in the past. Has not had a PCP in over a year. Works daily. Does not exercise. Takes medications as prescribed. Currently undetectable HIV viral load and CD4 count 1180.     Review of Systems  Constitutional: Negative.   HENT: Negative.   Respiratory: Negative.   Cardiovascular: Negative.   Gastrointestinal: Negative.   Endocrine: Negative.   Genitourinary: Negative.   Musculoskeletal: Negative for back pain.       Leg cramps at times   Skin: Negative.   Allergic/Immunologic: Negative.   Neurological: Negative.   Hematological: Negative.   Psychiatric/Behavioral: Negative.    Past Medical History  Diagnosis Date  . Hyperthyroidism 08/2006    Grave's Disease -diffuse radiotracer uptake 08/25/06 Thyroid scan-Cold nodule to R lower lobe of thyrorid  . HIV DISEASE 07/24/2006    dx March 05  . Sarcoidosis 02/08/2007    dx as a teenager in [place] from abnl CXR. Completed 2 yrs Prednisone after lung bx confirmation. No symptoms since then.  . THYROID NODULE, RIGHT 02/08/2007  . GRAVE'S DISEASE 01/01/2008  . 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 HYPERTENSION 07/24/2006  . ASTHMA 07/24/2006  . GERD 07/24/2006  . 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 Anemia     Normocytic  . History of hidradenitis suppurativa   . Gastroenteritis 07/10/07  . Menometrorrhagia     hx of  . History of thrush   . Bronchitis 2005  . Pneumonia 2005  . Nephrolithiasis     History   Social History  . Marital Status: Single    Spouse Name: N/A    Number  of Children: 1  . Years of Education: N/A   Occupational History  . [employer]   Social History Main Topics  . Smoking status: Current Some Day Smoker -- 0.50 packs/day for 15 years    Types: Cigarettes  . Smokeless tobacco: Never Used     Comment: will stop on 06/21/14  . Alcohol Use: No  . Drug Use: No  . Sexual Activity: Not Currently     Comment: Given condoms   Other Topics Concern  . Not on file   Social History Narrative    Past Surgical History  Procedure Laterality Date  . Total thyroidectomy  2010  . [place]  1994  . Dilation and curettage of uterus  Feb 2004    s/p for 1st trimester nonviable pregnancy  . Incision and drainage peritonsillar abscess  Mar 03  . Incise and drain abcess  Nov 03    s/p I &D for righ inframmary fold hidradenitis  . Irrigation and debridement abscess  01/31/2012    Procedure: IRRIGATION AND DEBRIDEMENT ABSCESS;  Surgeon: Shann Medal, MD;  Location: [facility];  Service: General;  Laterality: Right;  right breast and axilla   . Eye surgery      sty under eyelid  . Nephrolithotomy  10/02/2012    Procedure: NEPHROLITHOTOMY PERCUTANEOUS;  Surgeon: Alexis Frock, MD;  Location: [facility];  Service: Urology;  Laterality:  Right;  First Stage Percutaneous Nephrolithotomy with Surgeon Access, Left Ureteral Stent    . Cystoscopy with stent placement  10/02/2012    Procedure: CYSTOSCOPY WITH STENT PLACEMENT;  Surgeon: Alexis Frock, MD;  [facility]: [facility];  Service: Urology;  Laterality: Left;  . Nephrolithotomy  10/04/2012    Procedure: NEPHROLITHOTOMY PERCUTANEOUS SECOND LOOK;  Surgeon: Alexis Frock, MD;  [facility]: [facility];  Service: Urology;  Laterality: Right;     . Nephrolithotomy  10/08/2012    Procedure: NEPHROLITHOTOMY PERCUTANEOUS;  Surgeon: Alexis Frock, MD;  [facility];  Service: Urology;  Laterality: Right;  THIRD STAGE, nephrostomy tube exchange x 2  . Nephrolithotomy  10/11/2012    Procedure: NEPHROLITHOTOMY  PERCUTANEOUS SECOND LOOK;  Surgeon: Alexis Frock, MD;  [facility]: [facility];  Service: Urology;  Laterality: Right;  RIGHT 4 STAGE PERCUTANOUS NEPHROLITHOTOMY, right URETEROSCOPY WITH HOLMIUM LASER   . Cystoscopy with retrograde pyelogram, ureteroscopy and stent placement  11/09/2012    Procedure: [place], URETEROSCOPY AND STENT PLACEMENT;  Surgeon: Alexis Frock, MD;  [facility];  Service: Urology;  Laterality: Left;  LEFT URETEROSCOPY, STONE MANIPULATION, left STENT exchange   . Cystoscopy w/ ureteral stent removal  11/09/2012    Procedure: CYSTOSCOPY WITH STENT REMOVAL;  Surgeon: Alexis Frock, MD;  [facility];  Service: Urology;  Laterality: Right;  . Breast surgery  1997    Breast Reduction     Family History  Problem Relation Age of Onset  . Cancer Other     FH of Breast Cancer  . Cancer Other     Fh of Lung Cancer  . Heart disease Other   . Hypertension Other   . Stroke Other     Grandparent  . Kidney disease Other     Grandparent  . Diabetes Other     FH of Diabetes  . Hypertension Mother   . Cancer Mother     throat  . Hypertension Father   . Hypertension Sister   . Cancer Maternal Uncle     Stomach Cancer    Allergies  Allergen Reactions  . Tape Rash    Rash   . Lisinopril Cough  . Xylocaine [Lidocaine]     Unknown , patient states she has had lidocaine for IV starts and at the dentist with no problems    Current Outpatient Prescriptions on File Prior to Visit  Medication Sig Dispense Refill  . acetaminophen (TYLENOL) 500 MG tablet Take 1,000 mg by mouth every 6 (six) hours as needed for mild pain.    . ATRIPLA 600-200-300 MG per tablet take 1 tablet by mouth at bedtime 30 tablet 6  . fluticasone (FLONASE) 50 MCG/ACT nasal spray Place into both nostrils daily.    . hydrochlorothiazide (MICROZIDE) 12.5 MG capsule Take 12.5 mg by mouth daily.    Marland Kitchen ketorolac (TORADOL) 10 MG tablet Take 1 tablet (10 mg total) by mouth every  6 (six) hours as needed. 20 tablet 0  . levothyroxine (SYNTHROID, LEVOTHROID) 200 MCG tablet TAKE 1 TABLET BY MOUTH DAILY. 30 tablet 0  . methocarbamol (ROBAXIN) 500 MG tablet Take 2 tablets (1,000 mg total) by mouth every 8 (eight) hours as needed for muscle spasms. 30 tablet 0  . NIFEdipine (PROCARDIA XL/ADALAT-CC) 60 MG 24 hr tablet TAKE 1 TABLET (60 MG TOTAL) BY MOUTH DAILY. 30 tablet 7  . oxyCODONE-acetaminophen (ROXICET) 5-325 MG per tablet Take 1 tablet by mouth every 6 (six) hours as needed for severe pain. [place]  tablet 0   No current facility-administered medications on file prior to visit.    BP 144/88 mmHg  Pulse 86  Temp(Src) 97.9 F (36.6 C) (Oral)  Ht 5\' 6"  (1.676 m)  Wt 254 lb 14.4 oz (115.622 kg)  BMI 41.16 kg/m2chart    Objective:   Physical Exam  Constitutional: She is oriented to person, place, and time. She appears well-developed and well-nourished.  HENT:  Right Ear: External ear normal.  Left Ear: External ear normal.  Nose: Nose normal.  Mouth/Throat: Oropharynx is clear and moist.  Neck: Normal range of motion. Neck supple. No thyromegaly present.  Cardiovascular: Normal rate, regular rhythm and normal heart sounds.   Pulmonary/Chest: Effort normal and breath sounds normal.  Abdominal: Soft. Bowel sounds are normal.  Musculoskeletal: Normal range of motion.  Neurological: She is alert and oriented to person, place, and time.  Skin: Skin is warm and dry.  Psychiatric: She has a normal mood and affect.          Assessment & Plan:  Karinda was seen today for establish care.  Diagnoses and associated orders for this visit:  HIV disease  Essential hypertension  Renal calculi  Asthma, chronic, mild intermittent, uncomplicated  Hypothyroidism, unspecified hypothyroidism type - TSH  Hypokalemia - CMP  Breast cancer screening, high risk patient - MM Digital Screening; Future  Other Orders - albuterol (PROVENTIL HFA;VENTOLIN HFA) 108 (90 BASE)  MCG/ACT inhaler; Inhale 2 puffs into the lungs every 6 (six) hours as needed for wheezing or shortness of breath.    Labs sent today. Scheduled mammogram. Continue current medications. Consider increasing blood pressure medication but will await labs. Follow-up in 4 month and sooner as needed. See specialist as scheduled.

## 2014-10-31 ENCOUNTER — Telehealth: Payer: Self-pay | Admitting: Family

## 2014-10-31 ENCOUNTER — Ambulatory Visit (HOSPITAL_COMMUNITY)
Admission: RE | Admit: 2014-10-31 | Discharge: 2014-10-31 | Disposition: A | Discharge status: Home / Self Care | Payer: PRIVATE HEALTH INSURANCE | Source: Ambulatory Visit | Attending: Urology | Admitting: Urology

## 2014-10-31 DIAGNOSIS — N261 Atrophy of kidney (terminal): Secondary | ICD-10-CM

## 2014-10-31 DIAGNOSIS — N200 Calculus of kidney: Secondary | ICD-10-CM | POA: Diagnosis not present

## 2014-10-31 MED ORDER — FUROSEMIDE 10 MG/ML IJ SOLN
60.0000 mg | Freq: Once | INTRAMUSCULAR | Status: AC
  Administered 2014-10-31: 56 mg via INTRAVENOUS
  Filled 2014-10-31: qty 6

## 2014-10-31 MED ORDER — TECHNETIUM TC 99M MERTIATIDE
15.5000 | Freq: Once | INTRAVENOUS | Status: AC | PRN
  Administered 2014-10-31: 16 via INTRAVENOUS

## 2014-10-31 NOTE — Telephone Encounter (Signed)
emmi emailed °

## 2014-11-03 ENCOUNTER — Telehealth: Payer: Self-pay

## 2014-11-03 DIAGNOSIS — E876 Hypokalemia: Secondary | ICD-10-CM

## 2014-11-03 MED ORDER — AMLODIPINE BESYLATE 5 MG PO TABS: 5.0000 mg | ORAL_TABLET | Freq: Every day | ORAL | Status: DC

## 2014-11-03 MED ORDER — LEVOTHYROXINE SODIUM 25 MCG PO TABS: 25.0000 ug | ORAL_TABLET | Freq: Every day | ORAL | Status: DC

## 2014-11-03 MED ORDER — POTASSIUM CHLORIDE ER 10 MEQ PO TBCR: 10.0000 meq | EXTENDED_RELEASE_TABLET | Freq: Every day | ORAL | Status: DC

## 2014-11-03 NOTE — Telephone Encounter (Signed)
Spoke with Padonda.   Send in Norvasc 5mg , Synthroid 27mcg, and potassium 4meq. All Rxs sent in and pt aware. Lab order placed for recheck potassium and pt will call back tomorrow to schedule.

## 2014-11-05 ENCOUNTER — Ambulatory Visit
Admission: RE | Admit: 2014-11-05 | Discharge: 2014-11-05 | Disposition: A | Discharge status: Home / Self Care | Payer: PRIVATE HEALTH INSURANCE | Source: Ambulatory Visit | Attending: Family | Admitting: Family

## 2014-11-05 ENCOUNTER — Encounter (INDEPENDENT_AMBULATORY_CARE_PROVIDER_SITE_OTHER): Payer: Self-pay

## 2014-11-05 DIAGNOSIS — Z1239 Encounter for other screening for malignant neoplasm of breast: Secondary | ICD-10-CM

## 2014-11-10 ENCOUNTER — Other Ambulatory Visit: Payer: Self-pay | Admitting: Family

## 2014-11-10 DIAGNOSIS — R928 Other abnormal and inconclusive findings on diagnostic imaging of breast: Secondary | ICD-10-CM

## 2014-11-18 ENCOUNTER — Ambulatory Visit
Admission: RE | Admit: 2014-11-18 | Discharge: 2014-11-18 | Disposition: A | Discharge status: Home / Self Care | Payer: PRIVATE HEALTH INSURANCE | Source: Ambulatory Visit | Attending: Family | Admitting: Family

## 2014-11-18 ENCOUNTER — Other Ambulatory Visit: Payer: Self-pay | Admitting: Family

## 2014-11-18 DIAGNOSIS — N6310 Unspecified lump in the right breast, unspecified quadrant: Secondary | ICD-10-CM

## 2014-11-18 DIAGNOSIS — R928 Other abnormal and inconclusive findings on diagnostic imaging of breast: Secondary | ICD-10-CM

## 2014-11-25 ENCOUNTER — Telehealth: Payer: Self-pay

## 2014-11-25 MED ORDER — AMOXICILLIN-POT CLAVULANATE 875-125 MG PO TABS: 1.0000 | ORAL_TABLET | Freq: Two times a day (BID) | ORAL | Status: DC

## 2014-11-25 NOTE — Telephone Encounter (Signed)
Pt called and c/o sinus congestion, post nasal drip, otalgia and has been using flonase, zyrtec, and alka-seltzer plus cold medicine.  Spoke with Padonda. Advised to send augmentin 875mg  bid x 10 days  done and pt aware

## 2015-02-12 ENCOUNTER — Other Ambulatory Visit: Payer: Self-pay | Admitting: Family

## 2015-02-24 ENCOUNTER — Encounter: Payer: Self-pay | Admitting: Family

## 2015-02-24 ENCOUNTER — Ambulatory Visit (INDEPENDENT_AMBULATORY_CARE_PROVIDER_SITE_OTHER): Payer: PRIVATE HEALTH INSURANCE | Admitting: Family

## 2015-02-24 VITALS — BP 150/98 | HR 92 | Temp 98.5°F | Wt 265.7 lb

## 2015-02-24 DIAGNOSIS — J329 Chronic sinusitis, unspecified: Secondary | ICD-10-CM

## 2015-02-24 DIAGNOSIS — R059 Cough, unspecified: Secondary | ICD-10-CM

## 2015-02-24 DIAGNOSIS — E039 Hypothyroidism, unspecified: Secondary | ICD-10-CM

## 2015-02-24 DIAGNOSIS — N61 Inflammatory disorders of breast: Secondary | ICD-10-CM

## 2015-02-24 DIAGNOSIS — R05 Cough: Secondary | ICD-10-CM

## 2015-02-24 DIAGNOSIS — R0982 Postnasal drip: Secondary | ICD-10-CM

## 2015-02-24 LAB — TSH: TSH: 35.13 u[IU]/mL — ABNORMAL HIGH (ref 0.35–4.50)

## 2015-02-24 MED ORDER — PREDNISONE 20 MG PO TABS: ORAL_TABLET | ORAL | Status: AC

## 2015-02-24 MED ORDER — DOXYCYCLINE HYCLATE 100 MG PO TABS: 100.0000 mg | ORAL_TABLET | Freq: Two times a day (BID) | ORAL | Status: DC

## 2015-02-24 MED ORDER — PROMETHAZINE-DM 6.25-15 MG/5ML PO SYRP: 5.0000 mL | ORAL_SOLUTION | Freq: Four times a day (QID) | ORAL | Status: DC | PRN

## 2015-02-24 NOTE — Patient Instructions (Signed)
Mastitis Mastitis is inflammation of the breast tissue. It occurs most often in women who are breastfeeding, but it can also affect other women, and even sometimes men. CAUSES  Mastitis is usually caused by a bacterial infection. Bacteria enter the breast tissue through cuts or openings in the skin. Typically, this occurs with breastfeeding because of cracked or irritated skin. Sometimes, it can occur even when there is no opening in the skin. It can be associated with plugged milk (lactiferous) ducts. Nipple piercing can also lead to mastitis. Also, some forms of breast cancer can cause mastitis. SIGNS AND SYMPTOMS   Swelling, redness, tenderness, and pain in an area of the breast.  Swelling of the glands under the arm on the same side.  Fever. If an infection is allowed to progress, a collection of pus (abscess) may develop. DIAGNOSIS  Your health care provider can usually diagnose mastitis based on your symptoms and a physical exam. Tests may be done to help confirm the diagnosis. These may include:   Removal of pus from the breast by applying pressure to the area. This pus can be examined in the lab to determine which bacteria are present. If an abscess has developed, the fluid in the abscess can be removed with a needle. This can also be used to confirm the diagnosis and determine the bacteria present. In most cases, pus will not be present.  Blood tests to determine if your body is fighting a bacterial infection.  Mammogram or ultrasound tests to rule out other problems or diseases. TREATMENT  Antibiotic medicine is used to treat a bacterial infection. Your health care provider will determine which bacteria are most likely causing the infection and will select an appropriate antibiotic. This is sometimes changed based on the results of tests performed to identify the bacteria, or if there is no response to the antibiotic selected. Antibiotics are usually given by mouth. You may also be  given medicine for pain. Mastitis that occurs with breastfeeding will sometimes go away on its own, so your health care provider may choose to wait 24 hours after first seeing you to decide whether a prescription medicine is needed. HOME CARE INSTRUCTIONS   Only take over-the-counter or prescription medicines for pain, fever, or discomfort as directed by your health care provider.  If your health care provider prescribed an antibiotic, take the medicine as directed. Make sure you finish it even if you start to feel better.  Do not wear a tight or underwire bra. Wear a soft, supportive bra.  Increase your fluid intake, especially if you have a fever.  Women who are breastfeeding should follow these instructions:  Continue to empty the breast. Your health care provider can tell you whether this milk is safe for your infant or needs to be thrown out. You may be told to stop nursing until your health care provider thinks it is safe for your baby. Use a breast pump if you are advised to stop nursing.  Keep your nipples clean and dry.  Empty the first breast completely before going to the other breast. If your baby is not emptying your breasts completely for some reason, use a breast pump to empty your breasts.  If you go back to work, pump your breasts while at work to stay in time with your nursing schedule.  Avoid allowing your breasts to become overly filled with milk (engorged). SEEK MEDICAL CARE IF:   You have pus-like discharge from the breast.  Your symptoms do not  improve with the treatment prescribed by your health care provider within 2 days. SEEK IMMEDIATE MEDICAL CARE IF:   Your pain and swelling are getting worse.  You have pain that is not controlled with medicine.  You have a red line extending from the breast toward your armpit.  You have a fever or persistent symptoms for more than 2-3 days.  You have a fever and your symptoms suddenly get worse. Document Released:  10/03/2005 Document Revised: 10/08/2013 Document Reviewed: 05/03/2013 [place] [facility] Patient Information 2015 [place], [place]. This information is not intended to replace advice given to you by your health care provider. Make sure you discuss any questions you have with your health care provider.

## 2015-02-24 NOTE — Progress Notes (Signed)
Subjective:    Patient ID: [name], female    DOB: 23-May-1966, 49 y.o.   MRN: [id number]  HPI 49 year old African-American female, smoker with a history of HIV disease, sarcoidosis is in today with complaints of a cough and postnasal drip 1 week. Reports coughing so hard that she has pulled a muscle in her chest wall. Has not been taking the medication for relief. Cough worsened night. Productive with white phlegm. No fever.   Also has concerns right breast pain, redness, tenderness to palpation after attempting to burst a blackhead. Had a breast ultrasound in Feb and is due for a repeat ultrasound in August.    Review of Systems  Constitutional: Negative.   HENT: Positive for congestion, postnasal drip and rhinorrhea.   Respiratory: Negative.   Cardiovascular: Negative.   Gastrointestinal: Negative.   Endocrine: Negative.   Genitourinary: Negative.   Musculoskeletal: Negative.   Skin: Positive for color change.       Right breast redness and tenderness.   Allergic/Immunologic: Negative.   Neurological: Negative.   Psychiatric/Behavioral: Negative.    Past Medical History  Diagnosis Date  . Hyperthyroidism 08/2006    Grave's Disease -diffuse radiotracer uptake 08/25/06 Thyroid scan-Cold nodule to R lower lobe of thyrorid  . HIV DISEASE 07/24/2006    dx March 05  . Sarcoidosis 02/08/2007    dx as a teenager in [place] from abnl CXR. Completed 2 yrs Prednisone after lung bx confirmation. No symptoms since then.  . THYROID NODULE, RIGHT 02/08/2007  . GRAVE'S DISEASE 01/01/2008  . 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 HYPERTENSION 07/24/2006  . ASTHMA 07/24/2006  . GERD 07/24/2006  . 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 Anemia     Normocytic  . History of hidradenitis suppurativa   . Gastroenteritis 07/10/07  . Menometrorrhagia     hx of  . History of thrush   . Bronchitis 2005  . Pneumonia 2005  . Nephrolithiasis     History   Social History  .  Marital Status: Single    Spouse Name: N/A  . Number of Children: 1  . Years of Education: N/A   Occupational History  . [employer]   Social History Main Topics  . Smoking status: Current Some Day Smoker -- 0.50 packs/day for 15 years    Types: Cigarettes  . Smokeless tobacco: Never Used     Comment: will stop on 06/21/14  . Alcohol Use: No  . Drug Use: No  . Sexual Activity: Not Currently     Comment: Given condoms   Other Topics Concern  . Not on file   Social History Narrative    Past Surgical History  Procedure Laterality Date  . Total thyroidectomy  2010  . [place]  1994  . Dilation and curettage of uterus  Feb 2004    s/p for 1st trimester nonviable pregnancy  . Incision and drainage peritonsillar abscess  Mar 03  . Incise and drain abcess  Nov 03    s/p I &D for righ inframmary fold hidradenitis  . Irrigation and debridement abscess  01/31/2012    Procedure: IRRIGATION AND DEBRIDEMENT ABSCESS;  Surgeon: Shann Medal, MD;  [facility];  Service: General;  Laterality: Right;  right breast and axilla   . Eye surgery      sty under eyelid  . Nephrolithotomy  10/02/2012    Procedure: NEPHROLITHOTOMY PERCUTANEOUS;  Surgeon: Alexis Frock, MD;  Location: [facility];  Service: Urology;  Laterality: Right;  First Stage Percutaneous Nephrolithotomy with Surgeon Access, Left Ureteral Stent    . Cystoscopy with stent placement  10/02/2012    Procedure: CYSTOSCOPY WITH STENT PLACEMENT;  Surgeon: Alexis Frock, MD;  Location[facility];  Service: Urology;  Laterality: Left;  . Nephrolithotomy  10/04/2012    Procedure: NEPHROLITHOTOMY PERCUTANEOUS SECOND LOOK;  Surgeon: Alexis Frock, MD;  [facility];  Service: Urology;  Laterality: Right;     . Nephrolithotomy  10/08/2012    Procedure: NEPHROLITHOTOMY PERCUTANEOUS;  Surgeon: Alexis Frock, MD;  [facility];  Service: Urology;  Laterality: Right;  THIRD STAGE, nephrostomy tube exchange x 2  .  Nephrolithotomy  10/11/2012    Procedure: NEPHROLITHOTOMY PERCUTANEOUS SECOND LOOK;  Surgeon: Alexis Frock, MD;  [facility];  Service: Urology;  Laterality: Right;  RIGHT 4 STAGE PERCUTANOUS NEPHROLITHOTOMY, right URETEROSCOPY WITH HOLMIUM LASER   . Cystoscopy with retrograde pyelogram, ureteroscopy and stent placement  11/09/2012    Procedure: [place], URETEROSCOPY AND STENT PLACEMENT;  Surgeon: Alexis Frock, MD;  [facility];  Service: Urology;  Laterality: Left;  LEFT URETEROSCOPY, STONE MANIPULATION, left STENT exchange   . Cystoscopy w/ ureteral stent removal  11/09/2012    Procedure: CYSTOSCOPY WITH STENT REMOVAL;  Surgeon: Alexis Frock, MD;  [facility];  Service: Urology;  Laterality: Right;  . Breast surgery  1997    Breast Reduction     Family History  Problem Relation Age of Onset  . Cancer Other     FH of Breast Cancer  . Cancer Other     Fh of Lung Cancer  . Heart disease Other   . Hypertension Other   . Stroke Other     Grandparent  . Kidney disease Other     Grandparent  . Diabetes Other     FH of Diabetes  . Hypertension Mother   . Cancer Mother     throat  . Hypertension Father   . Hypertension Sister   . Cancer Maternal Uncle     Stomach Cancer    Allergies  Allergen Reactions  . Tape Rash    Rash   . Lisinopril Cough  . Xylocaine [Lidocaine]     Unknown , patient states she has had lidocaine for IV starts and at the dentist with no problems    Current Outpatient Prescriptions on File Prior to Visit  Medication Sig Dispense Refill  . acetaminophen (TYLENOL) 500 MG tablet Take 1,000 mg by mouth every 6 (six) hours as needed for mild pain.    Marland Kitchen albuterol (PROVENTIL HFA;VENTOLIN HFA) 108 (90 BASE) MCG/ACT inhaler Inhale 2 puffs into the lungs every 6 (six) hours as needed for wheezing or shortness of breath. 1 Inhaler 2  . amLODipine (NORVASC) 5 MG tablet TAKE 1 TABLET (5 MG TOTAL) BY MOUTH DAILY. 90  tablet 0  . ATRIPLA 600-200-300 MG per tablet take 1 tablet by mouth at bedtime 30 tablet 6  . fluticasone (FLONASE) 50 MCG/ACT nasal spray Place into both nostrils daily.    . hydrochlorothiazide (MICROZIDE) 12.5 MG capsule Take 12.5 mg by mouth daily.    Marland Kitchen ketorolac (TORADOL) 10 MG tablet Take 1 tablet (10 mg total) by mouth every 6 (six) hours as needed. 20 tablet 0  . levothyroxine (LEVOTHROID) 25 MCG tablet Take 1 tablet (25 mcg total) by mouth daily before breakfast. 30 tablet 3  . methocarbamol (ROBAXIN) 500 MG tablet Take 2 tablets (1,000 mg total) by mouth every 8 (eight) hours  as needed for muscle spasms. 30 tablet 0  . NIFEdipine (PROCARDIA XL/ADALAT-CC) 60 MG 24 hr tablet TAKE 1 TABLET (60 MG TOTAL) BY MOUTH DAILY. 30 tablet 7  . potassium chloride (K-DUR) 10 MEQ tablet Take 1 tablet (10 mEq total) by mouth daily. 30 tablet 1  . levothyroxine (SYNTHROID, LEVOTHROID) 200 MCG tablet TAKE 1 TABLET BY MOUTH DAILY. (Patient not taking: Reported on 02/24/2015) 30 tablet 0   No current facility-administered medications on file prior to visit.    BP 162/100 mmHg  Pulse 92  Temp(Src) 98.5 F (36.9 C) (Oral)  Wt 265 lb 11.2 oz (120.521 kg)  SpO2 98%chart    Objective:   Physical Exam  Constitutional: She is oriented to person, place, and time. She appears well-developed and well-nourished.  HENT:  Right Ear: External ear normal.  Left Ear: External ear normal.  Nose: Nose normal.  Mouth/Throat: Oropharynx is clear and moist.  Neck: Normal range of motion. Neck supple.  Cardiovascular: Normal rate, regular rhythm and normal heart sounds.   Pulmonary/Chest: Effort normal and breath sounds normal.  Abdominal: Soft. Bowel sounds are normal.  Musculoskeletal: Normal range of motion.  Neurological: She is alert and oriented to person, place, and time.  Skin: Skin is dry. There is erythema.  Right breast red, tender, no drainage or discharge. No axillary LA  Psychiatric: She has a  normal mood and affect.          Assessment & Plan:  Sahira was seen today for cough and breast problem.  Diagnoses and all orders for this visit:  Mastitis, acute  Post-nasal drainage Orders: -     TSH  Hypothyroidism, unspecified hypothyroidism type  Cough Orders: -     TSH  Other orders -     doxycycline (VIBRA-TABS) 100 MG tablet; Take 1 tablet (100 mg total) by mouth 2 (two) times daily. -     predniSONE (DELTASONE) 20 MG tablet; 60mg  PO qam x 3 days, 40mg  po qam x 3 days, 20mg  qam x 3 days -     promethazine-dextromethorphan (PROMETHAZINE-DM) 6.25-15 MG/5ML syrup; Take 5 mLs by mouth 4 (four) times daily as needed for cough.  Call the office with any questions or concerns. Recheck as scheduled and as

## 2015-02-24 NOTE — Progress Notes (Signed)
Pre visit review using our clinic review tool, if applicable. No additional management support is needed unless otherwise documented below in the visit note. 

## 2015-02-25 ENCOUNTER — Other Ambulatory Visit: Payer: Self-pay

## 2015-02-25 ENCOUNTER — Telehealth: Payer: Self-pay | Admitting: Family

## 2015-02-25 DIAGNOSIS — R7989 Other specified abnormal findings of blood chemistry: Secondary | ICD-10-CM

## 2015-02-25 MED ORDER — LEVOTHYROXINE SODIUM 25 MCG PO TABS: 25.0000 ug | ORAL_TABLET | Freq: Every day | ORAL | Status: DC

## 2015-02-25 MED ORDER — LEVOTHYROXINE SODIUM 200 MCG PO TABS: 200.0000 ug | ORAL_TABLET | Freq: Every day | ORAL | Status: DC

## 2015-02-25 NOTE — Telephone Encounter (Signed)
Note faxed.

## 2015-02-25 NOTE — Telephone Encounter (Signed)
Pt called to ask for a note for work. She said she is still coughing and having headaches. She said the note need to say that she was under doctor care 02/24/15 and 02/25/15     Ans it can be fax to [phone]

## 2015-03-13 ENCOUNTER — Other Ambulatory Visit: Payer: Self-pay | Admitting: *Deleted

## 2015-03-13 DIAGNOSIS — B20 Human immunodeficiency virus [HIV] disease: Secondary | ICD-10-CM

## 2015-03-13 MED ORDER — EFAVIRENZ-EMTRICITAB-TENOFOVIR 600-200-300 MG PO TABS: 1.0000 | ORAL_TABLET | Freq: Every day | ORAL | Status: DC

## 2015-03-17 ENCOUNTER — Other Ambulatory Visit (INDEPENDENT_AMBULATORY_CARE_PROVIDER_SITE_OTHER): Payer: PRIVATE HEALTH INSURANCE

## 2015-03-17 DIAGNOSIS — B20 Human immunodeficiency virus [HIV] disease: Secondary | ICD-10-CM | POA: Diagnosis not present

## 2015-03-17 LAB — CBC
HEMATOCRIT: 38.5 % (ref 36.0–46.0)
HEMOGLOBIN: 13 g/dL (ref 12.0–15.0)
MCH: 29.1 pg (ref 26.0–34.0)
MCHC: 33.8 g/dL (ref 30.0–36.0)
MCV: 86.1 fL (ref 78.0–100.0)
MPV: 8.6 fL (ref 8.6–12.4)
Platelets: 347 10*3/uL (ref 150–400)
RBC: 4.47 MIL/uL (ref 3.87–5.11)
RDW: 18.1 % — ABNORMAL HIGH (ref 11.5–15.5)
WBC: 8.6 10*3/uL (ref 4.0–10.5)

## 2015-03-17 LAB — COMPREHENSIVE METABOLIC PANEL
ALK PHOS: 117 U/L (ref 39–117)
ALT: 68 U/L — ABNORMAL HIGH (ref 0–35)
AST: 62 U/L — ABNORMAL HIGH (ref 0–37)
Albumin: 3.5 g/dL (ref 3.5–5.2)
BUN: 18 mg/dL (ref 6–23)
CALCIUM: 9.1 mg/dL (ref 8.4–10.5)
CHLORIDE: 102 meq/L (ref 96–112)
CO2: 26 meq/L (ref 19–32)
Creat: 0.92 mg/dL (ref 0.50–1.10)
Glucose, Bld: 175 mg/dL — ABNORMAL HIGH (ref 70–99)
POTASSIUM: 3.8 meq/L (ref 3.5–5.3)
Sodium: 139 mEq/L (ref 135–145)
TOTAL PROTEIN: 7 g/dL (ref 6.0–8.3)
Total Bilirubin: 0.3 mg/dL (ref 0.2–1.2)

## 2015-03-18 LAB — HIV-1 RNA QUANT-NO REFLEX-BLD
HIV 1 RNA Quant: <20 copies/mL (ref <20)
HIV-1 RNA Quant, Log: <1.3 {Log} (ref <1.30)

## 2015-03-18 LAB — T-HELPER CELL (CD4) - (RCID CLINIC ONLY)
CD4 % Helper T Cell: 36 % (ref 33–55)
CD4 T Cell Abs: 790 /uL (ref 400–2700)

## 2015-03-18 LAB — RPR

## 2015-03-30 ENCOUNTER — Ambulatory Visit: Payer: PRIVATE HEALTH INSURANCE | Admitting: Internal Medicine

## 2015-03-30 ENCOUNTER — Telehealth: Payer: Self-pay | Admitting: *Deleted

## 2015-03-30 NOTE — Telephone Encounter (Signed)
Made pt another appt.

## 2015-04-08 ENCOUNTER — Other Ambulatory Visit: Payer: PRIVATE HEALTH INSURANCE

## 2015-04-15 ENCOUNTER — Telehealth: Payer: Self-pay | Admitting: *Deleted

## 2015-04-15 ENCOUNTER — Ambulatory Visit (INDEPENDENT_AMBULATORY_CARE_PROVIDER_SITE_OTHER): Payer: PRIVATE HEALTH INSURANCE | Admitting: Internal Medicine

## 2015-04-15 VITALS — BP 167/108 | HR 84 | Temp 98.1°F | Wt 263.0 lb

## 2015-04-15 DIAGNOSIS — B20 Human immunodeficiency virus [HIV] disease: Secondary | ICD-10-CM | POA: Diagnosis not present

## 2015-04-15 DIAGNOSIS — R739 Hyperglycemia, unspecified: Secondary | ICD-10-CM | POA: Diagnosis not present

## 2015-04-15 MED ORDER — ELVITEG-COBIC-EMTRICIT-TENOFAF 150-150-200-10 MG PO TABS: 1.0000 | ORAL_TABLET | Freq: Every day | ORAL | Status: DC

## 2015-04-15 NOTE — Progress Notes (Signed)
Patient ID: [name], female   DOB: 1966/02/12, 49 y.o.   MRN: [id number]          Patient Active Problem List   Diagnosis Date Noted  . Human immunodeficiency virus (HIV) disease 07/24/2006    Priority: High  . Hyperglycemia 04/15/2015    Priority: Medium  . Hidradenitis suppurativa of right >> left axilla 01/30/2012    Priority: Medium  . 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 GRAVE'S DISEASE 01/01/2008    Priority: Medium  . TOBACCO USER 02/08/2007    Priority: Medium  . HYPERTENSION 07/24/2006    Priority: Medium  . ASTHMA 07/24/2006    Priority: Medium  . Renal calculi 10/29/2014  . Recurrent boils 06/11/2014  . Abscess of breast, right 01/09/2013  . Vaginal discharge 02/09/2012  . SARCOIDOSIS 02/08/2007  . THYROID NODULE, RIGHT 02/08/2007  . 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 DEPRESSION 07/24/2006  . GERD 07/24/2006    Patient's Medications  New Prescriptions   ELVITEGRAVIR-COBICISTAT-EMTRICITABINE-TENOFOVIR (GENVOYA) 150-150-200-10 MG TABS TABLET    Take 1 tablet by mouth daily with breakfast.  Previous Medications   ACETAMINOPHEN (TYLENOL) 500 MG TABLET    Take 1,000 mg by mouth every 6 (six) hours as needed for mild pain.   ALBUTEROL (PROVENTIL HFA;VENTOLIN HFA) 108 (90 BASE) MCG/ACT INHALER    Inhale 2 puffs into the lungs every 6 (six) hours as needed for wheezing or shortness of breath.   AMLODIPINE (NORVASC) 5 MG TABLET    TAKE 1 TABLET (5 MG TOTAL) BY MOUTH DAILY.   DOXYCYCLINE (VIBRA-TABS) 100 MG TABLET    Take 1 tablet (100 mg total) by mouth 2 (two) times daily.   FLUTICASONE (FLONASE) 50 MCG/ACT NASAL SPRAY    Place into both nostrils daily.   HYDROCHLOROTHIAZIDE (MICROZIDE) 12.5 MG CAPSULE    Take 12.5 mg by mouth daily.   KETOROLAC (TORADOL) 10 MG TABLET    Take 1 tablet (10 mg total) by mouth every 6 (six) hours as needed.   LEVOTHYROXINE (LEVOTHROID) 25 MCG TABLET    Take 1 tablet (25 mcg total) by mouth daily  before breakfast.   LEVOTHYROXINE (SYNTHROID, LEVOTHROID) 200 MCG TABLET    Take 1 tablet (200 mcg total) by mouth daily.   METHOCARBAMOL (ROBAXIN) 500 MG TABLET    Take 2 tablets (1,000 mg total) by mouth every 8 (eight) hours as needed for muscle spasms.   NIFEDIPINE (PROCARDIA XL/ADALAT-CC) 60 MG 24 HR TABLET    TAKE 1 TABLET (60 MG TOTAL) BY MOUTH DAILY.   POTASSIUM CHLORIDE (K-DUR) 10 MEQ TABLET    Take 1 tablet (10 mEq total) by mouth daily.   PROMETHAZINE-DEXTROMETHORPHAN (PROMETHAZINE-DM) 6.25-15 MG/5ML SYRUP    Take 5 mLs by mouth 4 (four) times daily as needed for cough.  Modified Medications   No medications on file  Discontinued Medications   EFAVIRENZ-EMTRICITABINE-TENOFOVIR (ATRIPLA) 600-200-300 MG PER TABLET    Take 1 tablet by mouth at bedtime.    Subjective: [name] is in for her routine HIV follow-up visit. She denies missing any doses of Atripla since her last visit. She takes it each evening before going to bed on an empty stomach. Over the past 6 months she has been under a great deal of stress. 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 She manages all of the finances. She's also been helping her mother who is had 2 recent strokes. She  has not been sleeping well for about 6 months. She has noted worsening of her crazy dreams. They will occur about each evening and will frequently wake her from sleep and may make it difficult to get back to bed. She has been more fatigued. She does not get any regular exercise. She is still smoking a half pack of cigarettes daily.  Review of Systems: Constitutional: positive for fatigue and malaise, negative for anorexia, chills, fevers, sweats and weight loss Eyes: negative Ears, nose, mouth, throat, and face: negative Respiratory: negative Cardiovascular: negative Gastrointestinal: negative Genitourinary:positive for more problems recently with recurrent kidney stones and staghorn  calculi. She states that she will require repeat hospitalization (presumably for surgical intervention and/or lithotripsy)  Past Medical History  Diagnosis Date  . Hyperthyroidism 08/2006    Grave's Disease -diffuse radiotracer uptake 08/25/06 Thyroid scan-Cold nodule to R lower lobe of thyrorid  . HIV DISEASE 07/24/2006    dx March 05  . Sarcoidosis 02/08/2007    dx as a teenager in [place] from abnl CXR. Completed 2 yrs Prednisone after lung bx confirmation. No symptoms since then.  . THYROID NODULE, RIGHT 02/08/2007  . GRAVE'S DISEASE 01/01/2008  . 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 HYPERTENSION 07/24/2006  . ASTHMA 07/24/2006  . GERD 07/24/2006  . 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 Anemia     Normocytic  . History of hidradenitis suppurativa   . Gastroenteritis 07/10/07  . Menometrorrhagia     hx of  . History of thrush   . Bronchitis 2005  . Pneumonia 2005  . Nephrolithiasis     History  Substance Use Topics  . Smoking status: Current Some Day Smoker -- 0.50 packs/day for 15 years    Types: Cigarettes  . Smokeless tobacco: Never Used     Comment: will stop on 06/21/14  . Alcohol Use: No    Family History  Problem Relation Age of Onset  . Cancer Other     FH of Breast Cancer  . Cancer Other     Fh of Lung Cancer  . Heart disease Other   . Hypertension Other   . Stroke Other     Grandparent  . Kidney disease Other     Grandparent  . Diabetes Other     FH of Diabetes  . Hypertension Mother   . Cancer Mother     throat  . Hypertension Father   . Hypertension Sister   . Cancer Maternal Uncle     Stomach Cancer    Allergies  Allergen Reactions  . Tape Rash    Rash   . Lisinopril Cough  . Xylocaine [Lidocaine]     Unknown , patient states she has had lidocaine for IV starts and at the dentist with no problems    Objective: Temp: 98.1 F (36.7 C) (06/29 1627) Temp Source: Oral (06/29 1627) BP: 167/108 mmHg (06/29 1637) Pulse Rate: 84  (06/29 1637) Body mass index is 42.47 kg/(m^2).  General: Her weight is up 52 pounds over the past year and a half to 263 Oral: No oropharyngeal lesions Skin: No rash or active boils at this time Lungs: Clear Cor: Regular S1 and S2 with no murmurs Abdomen: Obese, soft and nontender Joints and extremities: Normal Neuro: Alert with normal speech and conversation Mood: She is smiling and in good spirits. She does not appear anxious or depressed.  Lab Results Lab Results  Component Value Date   WBC 8.6 03/17/2015   HGB 13.0 03/17/2015   HCT  38.5 03/17/2015   MCV 86.1 03/17/2015   PLT 347 03/17/2015    Lab Results  Component Value Date   CREATININE 0.92 03/17/2015   BUN 18 03/17/2015   NA 139 03/17/2015   K 3.8 03/17/2015   CL 102 03/17/2015   CO2 26 03/17/2015    Lab Results  Component Value Date   ALT 68* 03/17/2015   AST 62* 03/17/2015   ALKPHOS 117 03/17/2015   BILITOT 0.3 03/17/2015    Lab Results  Component Value Date   CHOL 186 10/23/2013   HDL 40 10/23/2013   LDLCALC 120* 10/23/2013   TRIG 132 10/23/2013   CHOLHDL 4.7 10/23/2013    Lab Results HIV 1 RNA QUANT (copies/mL)  Date Value  03/17/2015 <20  05/29/2014 <20  10/23/2013 <20   CD4 T CELL ABS (/uL)  Date Value  03/17/2015 790  05/29/2014 1180  10/23/2013 1360     Assessment: Her HIV infection remains under excellent control. However her disruptive dreams are probably a side effect of the efavarenz component of Atripla. I talked with her about other options and will switch her to [place] once daily with breakfast. This is likely to resolve the problem of bad dreams and will also carry less risk of osteoporosis and nephrotoxicity than Atripla.  Her HIV infection should never cause her any significant harm but she is facing significant health risk related to metabolic syndrome. She is morbidly obese with poorly controlled hypertension, hyperglycemia, dyslipidemia and is still smoking cigarettes. I  talked to her at length today and tried to help her draw parallels between her current health conditions and her mother's recent strokes. I encouraged her to follow-up with Dr. Justin Mend, her primary care physician so that we can help her begin to address these issues.  Plan: 1. Change Atripla to Genvoya 2. Cigarette cessation counseling provided 3. Lifestyle modification counseling provided 4. Encouraged to follow-up with Dr. Justin Mend soon 5. Follow-up here after lab work in [place] months   Michel Bickers, MD [facility] for [place] [phone] pager   [phone] cell 04/15/2015, 5:20 PM

## 2015-04-15 NOTE — Telephone Encounter (Signed)
Requested pt call [facility] for a new appt.

## 2015-04-17 ENCOUNTER — Telehealth: Payer: Self-pay | Admitting: *Deleted

## 2015-04-17 DIAGNOSIS — B20 Human immunodeficiency virus [HIV] disease: Secondary | ICD-10-CM

## 2015-04-17 MED ORDER — ELVITEG-COBIC-EMTRICIT-TENOFAF 150-150-200-10 MG PO TABS
1.0000 | ORAL_TABLET | Freq: Every day | ORAL | Status: DC
Start: 2015-04-17 — End: 2015-06-09

## 2015-04-17 NOTE — Telephone Encounter (Signed)
This new medication may require a PA.  Will wait to see.

## 2015-05-15 ENCOUNTER — Other Ambulatory Visit: Payer: Self-pay | Admitting: Licensed Clinical Social Worker

## 2015-05-29 ENCOUNTER — Other Ambulatory Visit: Payer: Self-pay | Admitting: Family

## 2015-06-01 ENCOUNTER — Telehealth: Payer: Self-pay | Admitting: *Deleted

## 2015-06-01 NOTE — Telephone Encounter (Signed)
Itching started Thurs., Aug 11 and increased on Fri., Aug 12.  Red whelps started Saturday, Aug 13 and spread over entire body by Sunday, Aug. 14.  Benadryl helps some with the itching but not the red whelps.  RN reported symptoms to Dr. [name].  Dr. [name] ordered that the pt stop Genvoya, not restart Atripla and call [facility] on Thursday, Aug. 18 to let us know how she is doing.  Pt verbalized understanding.  Will call [facility] 06/04/15.

## 2015-06-05 ENCOUNTER — Telehealth: Payer: Self-pay | Admitting: *Deleted

## 2015-06-05 NOTE — Telephone Encounter (Signed)
Itching is decreasing after discontinuing Genvoya.  Pt requested appt to discuss HIV treatment options.  Pt scheduled with Dr. [name] for Tues., Aug. 23 @ 0945.

## 2015-06-09 ENCOUNTER — Ambulatory Visit (INDEPENDENT_AMBULATORY_CARE_PROVIDER_SITE_OTHER): Payer: PRIVATE HEALTH INSURANCE | Admitting: Internal Medicine

## 2015-06-09 ENCOUNTER — Encounter: Payer: Self-pay | Admitting: Internal Medicine

## 2015-06-09 VITALS — BP 170/126 | HR 80 | Temp 98.5°F | Wt 261.2 lb

## 2015-06-09 DIAGNOSIS — L732 Hidradenitis suppurativa: Secondary | ICD-10-CM

## 2015-06-09 DIAGNOSIS — I10 Essential (primary) hypertension: Secondary | ICD-10-CM

## 2015-06-09 DIAGNOSIS — B20 Human immunodeficiency virus [HIV] disease: Secondary | ICD-10-CM

## 2015-06-09 MED ORDER — EMTRICITAB-RILPIVIR-TENOFOV AF 200-25-25 MG PO TABS: 1.0000 | ORAL_TABLET | Freq: Every day | ORAL | Status: DC

## 2015-06-09 NOTE — Assessment & Plan Note (Signed)
Her blood pressure remains above goal. I had her schedule a follow-up appointment with her primary care provider.

## 2015-06-09 NOTE — Progress Notes (Signed)
Patient ID: [name], female   DOB: 1966-05-24, 49 y.o.   MRN: [id number]          Patient Active Problem List   Diagnosis Date Noted  . Human immunodeficiency virus (HIV) disease 07/24/2006    Priority: High  . Hyperglycemia 04/15/2015    Priority: Medium  . Hidradenitis suppurativa of right >> left axilla 01/30/2012    Priority: Medium  . 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 GRAVE'S DISEASE 01/01/2008    Priority: Medium  . TOBACCO USER 02/08/2007    Priority: Medium  . Essential hypertension 07/24/2006    Priority: Medium  . ASTHMA 07/24/2006    Priority: Medium  . Renal calculi 10/29/2014  . Recurrent boils 06/11/2014  . Abscess of breast, right 01/09/2013  . Vaginal discharge 02/09/2012  . SARCOIDOSIS 02/08/2007  . THYROID NODULE, RIGHT 02/08/2007  . 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 DEPRESSION 07/24/2006  . GERD 07/24/2006    Patient's Medications  New Prescriptions   EMTRICITABINE-RILPIVIR-TENOFOVIR AF (ODEFSEY) 200-25-25 MG TABS PER TABLET    Take 1 tablet by mouth daily.  Previous Medications   ACETAMINOPHEN (TYLENOL) 500 MG TABLET    Take 1,000 mg by mouth every 6 (six) hours as needed for mild pain.   ALBUTEROL (PROVENTIL HFA;VENTOLIN HFA) 108 (90 BASE) MCG/ACT INHALER    Inhale 2 puffs into the lungs every 6 (six) hours as needed for wheezing or shortness of breath.   AMLODIPINE (NORVASC) 5 MG TABLET    TAKE 1 TABLET (5 MG TOTAL) BY MOUTH DAILY.   DOXYCYCLINE (VIBRA-TABS) 100 MG TABLET    Take 1 tablet (100 mg total) by mouth 2 (two) times daily.   FLUTICASONE (FLONASE) 50 MCG/ACT NASAL SPRAY    Place into both nostrils daily.   HYDROCHLOROTHIAZIDE (MICROZIDE) 12.5 MG CAPSULE    Take 12.5 mg by mouth daily.   KETOROLAC (TORADOL) 10 MG TABLET    Take 1 tablet (10 mg total) by mouth every 6 (six) hours as needed.   LEVOTHYROXINE (LEVOTHROID) 25 MCG TABLET    Take 1 tablet (25 mcg total) by mouth daily before breakfast.    LEVOTHYROXINE (SYNTHROID, LEVOTHROID) 200 MCG TABLET    Take 1 tablet (200 mcg total) by mouth daily.   METHOCARBAMOL (ROBAXIN) 500 MG TABLET    Take 2 tablets (1,000 mg total) by mouth every 8 (eight) hours as needed for muscle spasms.   NIFEDIPINE (PROCARDIA XL/ADALAT-CC) 60 MG 24 HR TABLET    TAKE 1 TABLET (60 MG TOTAL) BY MOUTH DAILY.   POTASSIUM CHLORIDE (K-DUR) 10 MEQ TABLET    Take 1 tablet (10 mEq total) by mouth daily.   PROMETHAZINE-DEXTROMETHORPHAN (PROMETHAZINE-DM) 6.25-15 MG/5ML SYRUP    Take 5 mLs by mouth 4 (four) times daily as needed for cough.  Modified Medications   No medications on file  Discontinued Medications   ELVITEGRAVIR-COBICISTAT-EMTRICITABINE-TENOFOVIR (GENVOYA) 150-150-200-10 MG TABS TABLET    Take 1 tablet by mouth daily with breakfast.    Subjective: Ma[place]let recently switched from [place] to [place]. Shortly after starting Genvoya she began to develop hives and intense itching. She stopped taking Genvoya last week and her itching is slowly improving. She's been having trouble sleeping because of the itching. She tried taking diphenhydramine but this left her too drowsy during the day to do her work. She is also been bothered by a recurrent boil under her left arm. She is worried that she has off HIV medication and that she might  suddenly get ill with some new infection.   Review of Systems: Constitutional: positive for malaise, negative for anorexia, chills, fevers, sweats and weight loss Eyes: negative Ears, nose, mouth, throat, and face: negative Respiratory: negative Cardiovascular: negative Gastrointestinal: negative Genitourinary:negative  Past Medical History  Diagnosis Date  . Hyperthyroidism 08/2006    Grave's Disease -diffuse radiotracer uptake 08/25/06 Thyroid scan-Cold nodule to R lower lobe of thyrorid  . HIV DISEASE 07/24/2006    dx March 05  . Sarcoidosis 02/08/2007    dx as a teenager in [place] from abnl CXR. Completed 2 yrs Prednisone  after lung bx confirmation. No symptoms since then.  . THYROID NODULE, RIGHT 02/08/2007  . GRAVE'S DISEASE 01/01/2008  . 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 HYPERTENSION 07/24/2006  . ASTHMA 07/24/2006  . GERD 07/24/2006  . 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 Anemia     Normocytic  . History of hidradenitis suppurativa   . Gastroenteritis 07/10/07  . Menometrorrhagia     hx of  . History of thrush   . Bronchitis 2005  . Pneumonia 2005  . Nephrolithiasis     Social History  Substance Use Topics  . Smoking status: Current Some Day Smoker -- 0.50 packs/day for 15 years    Types: Cigarettes  . Smokeless tobacco: Never Used     Comment: will stop on 06/21/14  . Alcohol Use: No    Family History  Problem Relation Age of Onset  . Cancer Other     FH of Breast Cancer  . Cancer Other     Fh of Lung Cancer  . Heart disease Other   . Hypertension Other   . Stroke Other     Grandparent  . Kidney disease Other     Grandparent  . Diabetes Other     FH of Diabetes  . Hypertension Mother   . Cancer Mother     throat  . Hypertension Father   . Hypertension Sister   . Cancer Maternal Uncle     Stomach Cancer    Allergies  Allergen Reactions  . Tape Rash    Rash   . Lisinopril Cough  . Xylocaine [Lidocaine]     Unknown , patient states she has had lidocaine for IV starts and at the dentist with no problems    Objective:  Filed Vitals:   06/09/15 0938 06/09/15 0944  BP: 185/106 170/126  Pulse:  80  Temp: 98.5 F (36.9 C)   TempSrc: Oral   Weight: 261 lb 4 oz (118.502 kg)    Body mass index is 42.19 kg/(m^2).  General: She is scratching Oral: No oropharyngeal lesions Skin: No rash but some excoriations from scratching Left axilla: 1 small open lesion without drainage. No fluctuance Lungs: Clear Cor: Regular S1 and S2 with no murmur   Lab Results Lab Results  Component Value Date   WBC 8.6 03/17/2015   HGB 13.0 03/17/2015   HCT 38.5 03/17/2015     MCV 86.1 03/17/2015   PLT 347 03/17/2015    Lab Results  Component Value Date   CREATININE 0.92 03/17/2015   BUN 18 03/17/2015   NA 139 03/17/2015   K 3.8 03/17/2015   CL 102 03/17/2015   CO2 26 03/17/2015    Lab Results  Component Value Date   ALT 68* 03/17/2015   AST 62* 03/17/2015   ALKPHOS 117 03/17/2015   BILITOT 0.3 03/17/2015    Lab Results  Component Value Date   CHOL 186 10/23/2013  HDL 40 10/23/2013   LDLCALC 120* 10/23/2013   TRIG 132 10/23/2013   CHOLHDL 4.7 10/23/2013    Lab Results HIV 1 RNA QUANT (copies/mL)  Date Value  03/17/2015 <20  05/29/2014 <20  10/23/2013 <20   CD4 T CELL ABS (/uL)  Date Value  03/17/2015 790  05/29/2014 1180  10/23/2013 1360     Problem List Items Addressed This Visit      High   Human immunodeficiency virus (HIV) disease    I suspect that she had an allergic reaction to the elvitigravir component of Genvoya. I reviewed options with her and with our ID pharmacist. I will change her to [facility].       Relevant Medications   emtricitabine-rilpivir-tenofovir AF (ODEFSEY) 200-25-25 MG TABS per tablet   Other Relevant Orders   T-helper cell (CD4)- ([facility] clinic only)   HIV 1 RNA quant-no reflex-bld   CBC   Comprehensive metabolic panel     Medium   Essential hypertension    Her blood pressure remains above goal. I had her schedule a follow-up appointment with her primary care provider.      Hidradenitis suppurativa of right >> left axilla - Primary    She is having recurrent problems with left axillary boils. Her current lesion has opened and drained spontaneously. She does not need to go back on antibiotics therapy.      Relevant Medications   emtricitabine-rilpivir-tenofovir AF (ODEFSEY) 200-25-25 MG TABS per tablet        Michel Bickers, MD [facility] for Infectious [place] [phone] pager   [phone] cell 06/09/2015, 10:03 AM

## 2015-06-09 NOTE — Assessment & Plan Note (Signed)
I suspect that she had an allergic reaction to the elvitigravir component of Genvoya. I reviewed options with her and with our ID pharmacist. I will change her to [facility].

## 2015-06-09 NOTE — Progress Notes (Signed)
HPI: [name] is a 49 y.o. female who presents to the [facility] today for follow-up of her HIV infection.  Allergies: Allergies  Allergen Reactions  . Tape Rash    Rash   . Genvoya [Elviteg-Cobic-Emtricit-Tenofaf] Hives  . Lisinopril Cough  . Xylocaine [Lidocaine]     Unknown , patient states she has had lidocaine for IV starts and at the dentist with no problems    Vitals: Temp: 98.5 F (36.9 C) (08/23 0938) Temp Source: Oral (08/23 0938) BP: 170/126 mmHg (08/23 0944) Pulse Rate: 80 (08/23 0944)  Past Medical History: Past Medical History  Diagnosis Date  . Hyperthyroidism 08/2006    Grave's Disease -diffuse radiotracer uptake 08/25/06 Thyroid scan-Cold nodule to R lower lobe of thyrorid  . HIV DISEASE 07/24/2006    dx March 05  . Sarcoidosis 02/08/2007    dx as a teenager in [place] from abnl CXR. Completed 2 yrs Prednisone after lung bx confirmation. No symptoms since then.  . THYROID NODULE, RIGHT 02/08/2007  . GRAVE'S DISEASE 01/01/2008  . 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 HYPERTENSION 07/24/2006  . ASTHMA 07/24/2006  . GERD 07/24/2006  . 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 Anemia     Normocytic  . History of hidradenitis suppurativa   . Gastroenteritis 07/10/07  . Menometrorrhagia     hx of  . History of thrush   . Bronchitis 2005  . Pneumonia 2005  . Nephrolithiasis     Social History: Social History   Social History  . Marital Status: Single    Spouse Name: N/A  . Number of Children: 1  . Years of Education: N/A   Occupational History  . [employer]   Social History Main Topics  . Smoking status: Current Some Day Smoker -- 0.50 packs/day for 15 years    Types: Cigarettes  . Smokeless tobacco: Never Used     Comment: will stop on 06/21/14  . Alcohol Use: No  . Drug Use: No  . Sexual Activity: Not Currently     Comment: Given condoms   Other Topics Concern  . None   Social History Narrative    Previous  Regimen: Atripla Genvoya  Current Regimen: None  Labs: HIV 1 RNA QUANT (copies/mL)  Date Value  03/17/2015 <20  05/29/2014 <20  10/23/2013 <20   CD4 T CELL ABS (/uL)  Date Value  03/17/2015 790  05/29/2014 1180  10/23/2013 1360   HEP B S AB (no units)  Date Value  12/11/2006 NO   HEPATITIS B SURFACE AG (no units)  Date Value  12/11/2006 NO   HCV AB (no units)  Date Value  12/11/2006 NO    CrCl: CrCl cannot be calculated (Unknown ideal weight.).  Lipids:    Component Value Date/Time   CHOL 186 10/23/2013 1441   TRIG 132 10/23/2013 1441   HDL 40 10/23/2013 1441   CHOLHDL 4.7 10/23/2013 1441   VLDL 26 10/23/2013 1441   LDLCALC 120* 10/23/2013 1441    Assessment: 49 yo f who presents to the [facility] today to discuss HIV medications.  She was previously on Atripla for many years, but she started suffering terrible nightmares, so she was switched to [place].  After about 1 week, she developed severe itching with red bumps all over her body.  She called the clinic and was instructed to stop taking Genvoya.  She presents today to discuss other options. Upon discussion with Dr. [name], will start her on Odef[place] (rilpivirine 25 mg +  tenofovir alafenamide 25 mg + emtricitabine 200 mg). Counseled the patient on possible side effects of this medication including nausea for the first week.  Also emphasized the importance of taking this drug consistently everyday and not missing any doses.  She has always been compliant and is anxious to start her medication again.  She has no questions or issues for [place].  Recommendations: Stop [facility] (rilpivirine 25 mg + tenofovir alafenamide 25 mg + emtricitabine 200 mg)  Annick Dimaio L. Nicole Kindred, PharmD Clinical Infectious [facility] Infect[facility] 06/09/2015, 10:37 AM

## 2015-06-09 NOTE — Assessment & Plan Note (Signed)
She is having recurrent problems with left axillary boils. Her current lesion has opened and drained spontaneously. She does not need to go back on antibiotics therapy.

## 2015-06-23 ENCOUNTER — Other Ambulatory Visit: Payer: Self-pay | Admitting: Family

## 2015-06-23 ENCOUNTER — Ambulatory Visit (INDEPENDENT_AMBULATORY_CARE_PROVIDER_SITE_OTHER): Payer: PRIVATE HEALTH INSURANCE | Admitting: Family

## 2015-06-23 ENCOUNTER — Encounter: Payer: Self-pay | Admitting: Family

## 2015-06-23 VITALS — BP 140/98 | Temp 98.3°F | Ht 66.0 in | Wt 255.1 lb

## 2015-06-23 DIAGNOSIS — N61 Inflammatory disorders of breast: Secondary | ICD-10-CM | POA: Diagnosis not present

## 2015-06-23 DIAGNOSIS — E119 Type 2 diabetes mellitus without complications: Secondary | ICD-10-CM | POA: Insufficient documentation

## 2015-06-23 DIAGNOSIS — E1165 Type 2 diabetes mellitus with hyperglycemia: Secondary | ICD-10-CM

## 2015-06-23 DIAGNOSIS — I10 Essential (primary) hypertension: Secondary | ICD-10-CM | POA: Diagnosis not present

## 2015-06-23 DIAGNOSIS — B20 Human immunodeficiency virus [HIV] disease: Secondary | ICD-10-CM | POA: Diagnosis not present

## 2015-06-23 DIAGNOSIS — IMO0002 Reserved for concepts with insufficient information to code with codable children: Secondary | ICD-10-CM

## 2015-06-23 DIAGNOSIS — E1169 Type 2 diabetes mellitus with other specified complication: Secondary | ICD-10-CM | POA: Insufficient documentation

## 2015-06-23 DIAGNOSIS — R5383 Other fatigue: Secondary | ICD-10-CM

## 2015-06-23 DIAGNOSIS — E039 Hypothyroidism, unspecified: Secondary | ICD-10-CM

## 2015-06-23 LAB — POCT URINALYSIS DIPSTICK
BILIRUBIN UA: NEGATIVE
Ketones, UA: NEGATIVE
NITRITE UA: NEGATIVE
Spec Grav, UA: 1.01
Urobilinogen, UA: NEGATIVE
pH, UA: 7

## 2015-06-23 LAB — COMPREHENSIVE METABOLIC PANEL
ALT: 60 U/L — ABNORMAL HIGH (ref 0–35)
AST: 65 U/L — AB (ref 0–37)
Albumin: 3.7 g/dL (ref 3.5–5.2)
Alkaline Phosphatase: 172 U/L — ABNORMAL HIGH (ref 39–117)
BUN: 25 mg/dL — AB (ref 6–23)
CHLORIDE: 90 meq/L — AB (ref 96–112)
CO2: 29 mEq/L (ref 19–32)
Calcium: 10.3 mg/dL (ref 8.4–10.5)
Creatinine, Ser: 1.23 mg/dL — ABNORMAL HIGH (ref 0.40–1.20)
GFR: 60.16 mL/min (ref >60.00)
GLUCOSE: 864 mg/dL — AB (ref 70–99)
POTASSIUM: 4 meq/L (ref 3.5–5.1)
SODIUM: 126 meq/L — AB (ref 135–145)
Total Bilirubin: 0.3 mg/dL (ref 0.2–1.2)
Total Protein: 8.2 g/dL (ref 6.0–8.3)

## 2015-06-23 LAB — CBC WITH DIFFERENTIAL/PLATELET
BASOS ABS: 0 10*3/uL (ref 0.0–0.1)
BASOS PCT: 0.3 % (ref 0.0–3.0)
EOS ABS: 0.3 10*3/uL (ref 0.0–0.7)
Eosinophils Relative: 2.8 % (ref 0.0–5.0)
HCT: 43.2 % (ref 36.0–46.0)
HEMOGLOBIN: 14.4 g/dL (ref 12.0–15.0)
Lymphocytes Relative: 31.3 % (ref 12.0–46.0)
Lymphs Abs: 2.8 10*3/uL (ref 0.7–4.0)
MCHC: 33.4 g/dL (ref 30.0–36.0)
MCV: 88.8 fl (ref 78.0–100.0)
MONO ABS: 0.7 10*3/uL (ref 0.1–1.0)
Monocytes Relative: 7.4 % (ref 3.0–12.0)
Neutro Abs: 5.3 10*3/uL (ref 1.4–7.7)
Neutrophils Relative %: 58.2 % (ref 43.0–77.0)
PLATELETS: 362 10*3/uL (ref 150.0–400.0)
RBC: 4.86 Mil/uL (ref 3.87–5.11)
RDW: 16.3 % — AB (ref 11.5–15.5)
WBC: 9.1 10*3/uL (ref 4.0–10.5)

## 2015-06-23 LAB — HEMOGLOBIN A1C: Hgb A1c MFr Bld: 10.5 % — ABNORMAL HIGH (ref 4.6–6.5)

## 2015-06-23 LAB — TSH: TSH: 8.51 u[IU]/mL — AB (ref 0.35–4.50)

## 2015-06-23 MED ORDER — CEPHALEXIN 500 MG PO CAPS: 500.0000 mg | ORAL_CAPSULE | Freq: Three times a day (TID) | ORAL | Status: DC

## 2015-06-23 MED ORDER — METFORMIN HCL 500 MG PO TABS: 500.0000 mg | ORAL_TABLET | Freq: Two times a day (BID) | ORAL | Status: DC

## 2015-06-23 NOTE — Patient Instructions (Signed)
Mastitis Mastitis is inflammation of the breast tissue. It occurs most often in women who are breastfeeding, but it can also affect other women, and even sometimes men. CAUSES  Mastitis is usually caused by a bacterial infection. Bacteria enter the breast tissue through cuts or openings in the skin. Typically, this occurs with breastfeeding because of cracked or irritated skin. Sometimes, it can occur even when there is no opening in the skin. It can be associated with plugged milk (lactiferous) ducts. Nipple piercing can also lead to mastitis. Also, some forms of breast cancer can cause mastitis. SIGNS AND SYMPTOMS   Swelling, redness, tenderness, and pain in an area of the breast.  Swelling of the glands under the arm on the same side.  Fever. If an infection is allowed to progress, a collection of pus (abscess) may develop. DIAGNOSIS  Your health care provider can usually diagnose mastitis based on your symptoms and a physical exam. Tests may be done to help confirm the diagnosis. These may include:   Removal of pus from the breast by applying pressure to the area. This pus can be examined in the lab to determine which bacteria are present. If an abscess has developed, the fluid in the abscess can be removed with a needle. This can also be used to confirm the diagnosis and determine the bacteria present. In most cases, pus will not be present.  Blood tests to determine if your body is fighting a bacterial infection.  Mammogram or ultrasound tests to rule out other problems or diseases. TREATMENT  Antibiotic medicine is used to treat a bacterial infection. Your health care provider will determine which bacteria are most likely causing the infection and will select an appropriate antibiotic. This is sometimes changed based on the results of tests performed to identify the bacteria, or if there is no response to the antibiotic selected. Antibiotics are usually given by mouth. You may also be  given medicine for pain. Mastitis that occurs with breastfeeding will sometimes go away on its own, so your health care provider may choose to wait 24 hours after first seeing you to decide whether a prescription medicine is needed. HOME CARE INSTRUCTIONS   Only take over-the-counter or prescription medicines for pain, fever, or discomfort as directed by your health care provider.  If your health care provider prescribed an antibiotic, take the medicine as directed. Make sure you finish it even if you start to feel better.  Do not wear a tight or underwire bra. Wear a soft, supportive bra.  Increase your fluid intake, especially if you have a fever.  Women who are breastfeeding should follow these instructions:  Continue to empty the breast. Your health care provider can tell you whether this milk is safe for your infant or needs to be thrown out. You may be told to stop nursing until your health care provider thinks it is safe for your baby. Use a breast pump if you are advised to stop nursing.  Keep your nipples clean and dry.  Empty the first breast completely before going to the other breast. If your baby is not emptying your breasts completely for some reason, use a breast pump to empty your breasts.  If you go back to work, pump your breasts while at work to stay in time with your nursing schedule.  Avoid allowing your breasts to become overly filled with milk (engorged). SEEK MEDICAL CARE IF:   You have pus-like discharge from the breast.  Your symptoms do not  improve with the treatment prescribed by your health care provider within 2 days. SEEK IMMEDIATE MEDICAL CARE IF:   Your pain and swelling are getting worse.  You have pain that is not controlled with medicine.  You have a red line extending from the breast toward your armpit.  You have a fever or persistent symptoms for more than 2-3 days.  You have a fever and your symptoms suddenly get worse. Document Released:  10/03/2005 Document Revised: 10/08/2013 Document Reviewed: 05/03/2013 [facility] Patient Information 2015 [place], [place]. This information is not intended to replace advice given to you by your health care provider. Make sure you discuss any questions you have with your health care provider.

## 2015-06-23 NOTE — Progress Notes (Signed)
Pre visit review using our clinic review tool, if applicable. No additional management support is needed unless otherwise documented below in the visit note. 

## 2015-06-23 NOTE — Progress Notes (Signed)
   Subjective:    Patient ID: [name], female    DOB: 23-Mar-1966, 49 y.o.   MRN: [id number]  HPI  49 year old African-American female, half pack per day smoker, with a history of hypertension, GERD, HIV disease, depression is in today with complaints of a rated tender swollen right breast 3 days. Denies any drainage or discharge. Reports having an allergic reaction to one of her medications that cause her to well-developed and she did a lot of scratching. Last mammogram was 3 months ago and was stable. Denies fever but reports feeling unwell. Feels more tired and drained recently. Last CD4 count was 790. Viral load undetectable.  Review of Systems  Constitutional: Positive for fatigue.  Eyes: Negative.   Respiratory: Negative.   Cardiovascular: Negative.   Gastrointestinal: Negative.   Endocrine: Negative.   Genitourinary: Negative.   Musculoskeletal: Negative.   Skin:       Red, tender, swollen right breast  Allergic/Immunologic: Negative.   Neurological: Negative.   Psychiatric/Behavioral: Negative.        Objective:   Physical Exam  Constitutional: She is oriented to person, place, and time. She appears well-developed and well-nourished.  Neck: Normal range of motion. Neck supple.  Cardiovascular: Normal rate, regular rhythm and normal heart sounds.   Pulmonary/Chest: Effort normal and breath sounds normal.    Musculoskeletal: Normal range of motion.  Neurological: She is alert and oriented to person, place, and time.  Skin: Skin is warm and dry.  Psychiatric: She has a normal mood and affect.          Assessment & Plan:  Miamarie was seen today for breast problem.  Diagnoses and all orders for this visit:  Mastitis -     CBC with Differential -     TSH -     CMP -     Cancel: Urinalysis -     Urine culture -     POCT urinalysis dipstick  Essential hypertension -     CBC with Differential -     TSH -     CMP -     Cancel: Urinalysis -      Hemoglobin A1c -     Urine culture -     POCT urinalysis dipstick  Hypothyroidism, unspecified hypothyroidism type -     CBC with Differential -     TSH -     CMP -     Cancel: Urinalysis  Human immunodeficiency virus (HIV) disease  Other fatigue  Other orders -     cephALEXin (KEFLEX) 500 MG capsule; Take 1 capsule (500 mg total) by mouth 3 (three) times daily.   Call the office with any questions or concerns. Advised her to return in 1 week if her symptoms persist. Labs obtained will notify patient pending results.

## 2015-06-24 ENCOUNTER — Telehealth: Payer: Self-pay

## 2015-06-24 NOTE — Telephone Encounter (Signed)
Patient called in with concerns of new diagnosis of diabetes. Patient states she was supposed to have been set up for supplies to check her sugar and a home monitor but has not received it. She also wants to know more about the diabetes classes that were discussed yesterday at appointment. Please provide instructions and I will be happy to take the appropriate steps.

## 2015-06-24 NOTE — Telephone Encounter (Signed)
The pt called and is confused about her diabetes treatment.  She is hoping to get clarification on what medications she needs and what machine she should be using.   Pt callack - [phone]

## 2015-06-25 ENCOUNTER — Other Ambulatory Visit: Payer: Self-pay | Admitting: *Deleted

## 2015-06-25 DIAGNOSIS — E1165 Type 2 diabetes mellitus with hyperglycemia: Secondary | ICD-10-CM

## 2015-06-25 DIAGNOSIS — IMO0002 Reserved for concepts with insufficient information to code with codable children: Secondary | ICD-10-CM

## 2015-06-25 LAB — URINE CULTURE: Colony Count: 25000

## 2015-06-25 MED ORDER — GLUCOSE BLOOD VI STRP
ORAL_STRIP | Status: DC
Start: 2015-06-25 — End: 2017-06-08

## 2015-06-25 MED ORDER — ONETOUCH LANCETS MISC: Status: DC

## 2015-06-25 NOTE — Telephone Encounter (Signed)
I have spoken with [name]. Patient has glucose monitor at office ready for pick up and diabetes coordinator has been referred. Patient is aware.

## 2015-06-25 NOTE — Telephone Encounter (Signed)
Pt calling back in today stating that medication is making her feel bad. Pt did not readily remember speaking with Sharyn Lull yesterday until I read the conversation back to her.

## 2015-06-26 ENCOUNTER — Ambulatory Visit: Payer: PRIVATE HEALTH INSURANCE

## 2015-06-29 ENCOUNTER — Ambulatory Visit (INDEPENDENT_AMBULATORY_CARE_PROVIDER_SITE_OTHER): Payer: PRIVATE HEALTH INSURANCE | Admitting: Family Medicine

## 2015-06-29 ENCOUNTER — Telehealth: Payer: Self-pay | Admitting: Family

## 2015-06-29 ENCOUNTER — Encounter: Payer: Self-pay | Admitting: Family Medicine

## 2015-06-29 VITALS — BP 130/98 | HR 83 | Temp 98.4°F | Wt 251.0 lb

## 2015-06-29 DIAGNOSIS — H66001 Acute suppurative otitis media without spontaneous rupture of ear drum, right ear: Secondary | ICD-10-CM

## 2015-06-29 MED ORDER — AZITHROMYCIN 250 MG PO TABS: ORAL_TABLET | ORAL | Status: DC

## 2015-06-29 NOTE — Progress Notes (Signed)
PCP: Kennyth Arnold, FNP  Subjective:  [name] is a 49 y.o. year old very pleasant female patient who presents with Upper Respiratory infection symptoms including 7 days- cough, congestion, runny nose, yellow or green drainage, occasional chill. Not improving. No fever. R ear pain and worsened cough since Friday. Has not really taken anything, just tried to rest. No known sick contacts. Already on keflex for mastitis.   ROS-denies fever, SOB, NVD, tooth pain  Pertinent Past Medical History- HIV managed by Dr. [name]- historically well controlled, DM, HTN, obesity  Medications- reviewed  Current Outpatient Prescriptions  Medication Sig Dispense Refill  . amLODipine (NORVASC) 5 MG tablet TAKE 1 TABLET (5 MG TOTAL) BY MOUTH DAILY. 90 tablet 0  . cephALEXin (KEFLEX) 500 MG capsule Take 1 capsule (500 mg total) by mouth 3 (three) times daily. 21 capsule 0  . emtricitabine-rilpivir-tenofovir AF (ODEFSEY) 200-25-25 MG TABS per tablet Take 1 tablet by mouth daily. 30 tablet 11  . fluticasone (FLONASE) 50 MCG/ACT nasal spray Place into both nostrils daily.    Marland Kitchen glucose blood (ONETOUCH VERIO) test strip USE TO CHECK BLOOD SUGAR DAILY AND PRN 100 each 4  . hydrochlorothiazide (MICROZIDE) 12.5 MG capsule Take 12.5 mg by mouth daily.    Marland Kitchen levothyroxine (SYNTHROID, LEVOTHROID) 200 MCG tablet Take 1 tablet (200 mcg total) by mouth daily. 30 tablet 3  . metFORMIN (GLUCOPHAGE) 500 MG tablet Take 1 tablet (500 mg total) by mouth 2 (two) times daily with a meal. 60 tablet 3  . NIFEdipine (PROCARDIA XL/ADALAT-CC) 60 MG 24 hr tablet TAKE 1 TABLET (60 MG TOTAL) BY MOUTH DAILY. 30 tablet 7  . ONE TOUCH LANCETS MISC USE TO CHECK BLOOD SUGAR DAILY AND PRN 100 each 4  . potassium chloride (K-DUR) 10 MEQ tablet Take 1 tablet (10 mEq total) by mouth daily. 30 tablet 1  . acetaminophen (TYLENOL) 500 MG tablet Take 1,000 mg by mouth every 6 (six) hours as needed for mild pain.    Marland Kitchen albuterol (PROVENTIL  HFA;VENTOLIN HFA) 108 (90 BASE) MCG/ACT inhaler Inhale 2 puffs into the lungs every 6 (six) hours as needed for wheezing or shortness of breath. (Patient not taking: Reported on 06/29/2015) 1 Inhaler 2  . azithromycin (ZITHROMAX) 250 MG tablet Take 2 tabs on day 1, then 1 tab daily until finished 6 tablet 0  . ketorolac (TORADOL) 10 MG tablet Take 1 tablet (10 mg total) by mouth every 6 (six) hours as needed. (Patient not taking: Reported on 06/29/2015) 20 tablet 0   Objective: BP 130/98 mmHg  Pulse 83  Temp(Src) 98.4 F (36.9 C)  Wt 251 lb (113.853 kg)  SpO2 91% Gen: NAD, resting comfortably HEENT: Turbinates erythematous with yellow drainage, TM normal on left, on right erythematous and bulging TM, pharynx mildly erythematous with no tonsilar exudate or edema, no sinus tenderness CV: RRR no murmurs rubs or gallops Lungs: CTAB no crackles, wheeze, rhonchi Abdomen: soft/nontender/nondistended/normal bowel sounds. No rebound or guarding.  Ext: no edema Skin: warm, dry, no rash Neuro: grossly normal, moves all extremities  Assessment/Plan:  Right Otitis Media During Upper Respiratory infection Already on keflex for mastitis. With HIV history, decision made to treat with antibiotics. Will cover with azithromycin. We reviewed strict reasons to return to care including if symptoms worsen or persist or new concerns arise. No breathing difficulty reported and lungs clear in regards to 91% sat- doubt PNA.   Meds ordered this encounter  Medications  . azithromycin (ZITHROMAX) 250 MG  tablet    Sig: Take 2 tabs on day 1, then 1 tab daily until finished    Dispense:  6 tablet    Refill:  0

## 2015-06-29 NOTE — Telephone Encounter (Signed)
PLEASE NOTE: All timestamps contained within this report are represented as [place] Standard Time. CONFIDENTIALTY NOTICE: This fax transmission is intended only for the addressee. It contains information that is legally privileged, confidential or otherwise protected from use or disclosure. If you are not the intended recipient, you are strictly prohibited from reviewing, disclosing, copying using or disseminating any of this information or taking any action in reliance on or regarding this information. If you have received this fax in error, please notify us immediately by telephone so that we can arrange for its return to [place]. Phone: [phone], Toll-Free: [phone], Fax: [phone] Page: 1 of 2 Call Id: [id number] [facility] Patient Name: [name] Gender: Female DOB: Nov 07, 1965 Age: 49 Y 1 M 14 D Return Phone Number: [phone] (Primary) Address: City/State/Zip: [place] Client [facility] - Client Client Site [facility] - Day Physician Roxy Cedar Contact Type Call Call Type Triage / Clinical Relationship To Patient Self Appointment Disposition EMR Appointment Scheduled Info pasted into Epic Yes Return Phone Number [phone] (Primary) Chief Complaint Cold Symptom Initial Comment Caller states is now a diabetic; has a cold; PreDisposition Call Doctor Nurse Assessment Nurse: Mechele Dawley, RN, Amy Date/Time Eilene Ghazi Time): 06/29/2015 10:48:02 AM Confirm and document reason for call. If symptomatic, describe symptoms. ---HEADACHE, DRAINAGE, SLIGHT COUGH. NASAL CONGESTION - BROWN. NO FEVER. Has the patient traveled out of the country within the last 30 days? ---Not Applicable Does the patient require triage? ---Yes Related visit to physician within the last 2 weeks? ---No Does the PT have any chronic conditions? (i.e. diabetes, asthma,  etc.) ---Yes List chronic conditions. ---DIABETIC NEWLY DIAGNOSED, HYPERTENSION, THYROID, AUTOIMMUNE DISORDER Did the patient indicate they were pregnant? ---No Guidelines Guideline Title Affirmed Question Affirmed Notes Nurse Date/Time Eilene Ghazi Time) Common Cold Earache Mechele Dawley, RN, Amy 06/29/2015 10:49:31 AM Disp. Time Eilene Ghazi Time) Disposition Final User 06/29/2015 10:55:49 AM See Physician within 24 Hours Yes [place], RN, Amy Caller Understands: Yes Disagree/Comply: Comply Care Advice Given Per Guideline PLEASE NOTE: All timestamps contained within this report are represented as [place] Standard Time. CONFIDENTIALTY NOTICE: This fax transmission is intended only for the addressee. It contains information that is legally privileged, confidential or otherwise protected from use or disclosure. If you are not the intended recipient, you are strictly prohibited from reviewing, disclosing, copying using or disseminating any of this information or taking any action in reliance on or regarding this information. If you have received this fax in error, please notify us immediately by telephone so that we can arrange for its return to [place]. Phone: [phone], Toll-Free: [phone], Fax: [phone] Page: 2 of 2 Call Id: [id number] Care Advice Given Per Guideline SEE PHYSICIAN WITHIN 24 HOURS: CARE ADVICE given per [facility] (Adult) guideline. After Care Instructions Given Call Event Type User Date / Time Description Referrals REFERRED TO PCP OFFICE

## 2015-06-29 NOTE — Patient Instructions (Addendum)
R ear appears infected. Treat with azithromycin for 5 days. Return if symptoms worsen or new symptoms or do not resolve by Monday

## 2015-06-30 ENCOUNTER — Telehealth: Payer: Self-pay | Admitting: Family

## 2015-06-30 NOTE — Telephone Encounter (Signed)
I thought you told me to write pt a note for yesterday, sorry for the confusion. How long should she be out until?

## 2015-06-30 NOTE — Telephone Encounter (Signed)
Pt notified that note up front for pick up and Dr.Hunter advises pt to return to work tomorrow.

## 2015-06-30 NOTE — Telephone Encounter (Signed)
Pt saw Dr Yong Channel yesterday. She did not go to work today and the note she was given only had her out yesterday. Pt is asking if she can have a note for yesterdayand today. Would like a call back.

## 2015-06-30 NOTE — Telephone Encounter (Signed)
There is really no reason she cannot go to work- I encouraged her to be out yesterday to rest up. She has had 24 hours of antibiotics. She should return to work tomorrow but you can write her a note to cover today as well.

## 2015-07-10 ENCOUNTER — Ambulatory Visit: Payer: PRIVATE HEALTH INSURANCE

## 2015-07-16 LAB — HM DIABETES EYE EXAM

## 2015-07-21 ENCOUNTER — Encounter: Payer: Self-pay | Admitting: Family

## 2015-08-02 ENCOUNTER — Other Ambulatory Visit: Payer: Self-pay | Admitting: Family

## 2015-08-02 ENCOUNTER — Other Ambulatory Visit: Payer: Self-pay | Admitting: Internal Medicine

## 2015-08-02 DIAGNOSIS — I10 Essential (primary) hypertension: Secondary | ICD-10-CM

## 2015-08-05 ENCOUNTER — Other Ambulatory Visit: Payer: Self-pay | Admitting: Family

## 2015-08-05 MED ORDER — FLUCONAZOLE 150 MG PO TABS: 150.0000 mg | ORAL_TABLET | Freq: Once | ORAL | Status: DC

## 2015-08-10 ENCOUNTER — Encounter: Payer: Self-pay | Admitting: *Deleted

## 2015-08-10 ENCOUNTER — Encounter: Payer: PRIVATE HEALTH INSURANCE | Attending: Family | Admitting: *Deleted

## 2015-08-10 DIAGNOSIS — E119 Type 2 diabetes mellitus without complications: Secondary | ICD-10-CM | POA: Diagnosis not present

## 2015-08-10 DIAGNOSIS — Z713 Dietary counseling and surveillance: Secondary | ICD-10-CM | POA: Diagnosis not present

## 2015-08-10 NOTE — Progress Notes (Signed)
Diabetes Self-Management Education  Visit Type: First/Initial  Appt. Start Time: [id number] Appt. End Time: 1030  08/10/2015  [name], identified by name and date of birth, is a 49 y.o. female with a diagnosis of Diabetes: Type 2.   ASSESSMENT  Patient states she was recently diagnosed with diabetes.  Has significant family history of diabetes, but no prior knowledge on diabetes management      Diabetes Self-Management Education - 08/10/15 0930    Visit Information   Visit Type First/Initial   Initial Visit   Diabetes Type Type 2   Are you taking your medications as prescribed? Yes   Health Coping   How would you rate your overall health? Fair   Psychosocial Assessment   Patient Belief/Attitude about Diabetes Other (comment)  overwhelming   Self-care barriers None   Self-management support Family;Internet communities   Other persons present Patient   Patient Concerns Nutrition/Meal planning;Medication   Special Needs None   Preferred Learning Style Hands on;Visual   Learning Readiness Ready   How often do you need to have someone help you when you read instructions, pamphlets, or other written materials from your doctor or pharmacy? 1 - Never   What is the last grade level you completed in school? in college   Complications   Last HgB A1C per patient/outside source 10.5 %   How often do you check your blood sugar? 3-4 times/day   Fasting Blood glucose range (mg/dL) 130-179   Postprandial Blood glucose range (mg/dL) 180-200   Number of hypoglycemic episodes per month 0   Number of hyperglycemic episodes per week 0   Have you had a dilated eye exam in the past 12 months? Yes   Have you had a dental exam in the past 12 months? No   Are you checking your feet? No   Dietary Intake   Breakfast oatmeal   Lunch salad   Snack (afternoon) nuts; fruits   Dinner chicken and vegetables   Beverage(s) water    Exercise   Exercise Type ADL's   Patient Education   Previous  Diabetes Education No   Disease state  Definition of diabetes, type 1 and 2, and the diagnosis of diabetes;Factors that contribute to the development of diabetes   Nutrition management  Role of diet in the treatment of diabetes and the relationship between the three main macronutrients and blood glucose level;Food label reading, portion sizes and measuring food.;Carbohydrate counting   Physical activity and exercise  Role of exercise on diabetes management, blood pressure control and cardiac health.   Medications Reviewed patients medication for diabetes, action, purpose, timing of dose and side effects.   Monitoring Taught/discussed recording of test results and interpretation of SMBG.;Identified appropriate SMBG and/or A1C goals.   Acute complications Taught treatment of hypoglycemia - the 15 rule.;Discussed and identified patients' treatment of hyperglycemia.   Chronic complications Relationship between chronic complications and blood glucose control   Psychosocial adjustment Role of stress on diabetes   Individualized Goals (developed by patient)   Nutrition Follow meal plan discussed;General guidelines for healthy choices and portions discussed   Physical Activity Exercise 3-5 times per week   Medications take my medication as prescribed   Reducing Risk examine blood glucose patterns   Outcomes   Expected Outcomes Demonstrated interest in learning. Expect positive outcomes   Future DMSE PRN   Program Status Completed      Individualized Plan for Diabetes Self-Management Training:   Learning Objective:  Patient will have  a greater understanding of diabetes self-management. Patient education plan is to attend individual and/or group sessions per assessed needs and concerns.    Expected Outcomes:  Demonstrated interest in learning. Expect positive outcomes  Education material provided: Living Well with Diabetes and Meal plan card  If problems or questions, patient to contact team  via:  Phone  Future DSME appointment: PRN

## 2015-08-31 ENCOUNTER — Other Ambulatory Visit: Payer: Self-pay | Admitting: Internal Medicine

## 2015-09-07 ENCOUNTER — Encounter: Payer: Self-pay | Admitting: Adult Health

## 2015-09-07 ENCOUNTER — Encounter: Payer: Self-pay | Admitting: Internal Medicine

## 2015-09-07 ENCOUNTER — Telehealth: Payer: Self-pay | Admitting: *Deleted

## 2015-09-07 ENCOUNTER — Ambulatory Visit (INDEPENDENT_AMBULATORY_CARE_PROVIDER_SITE_OTHER): Payer: Managed Care, Other (non HMO) | Admitting: Adult Health

## 2015-09-07 ENCOUNTER — Telehealth: Payer: Self-pay | Admitting: Family

## 2015-09-07 ENCOUNTER — Ambulatory Visit (HOSPITAL_COMMUNITY)
Admission: RE | Admit: 2015-09-07 | Discharge: 2015-09-07 | Disposition: A | Discharge status: Home / Self Care | Payer: Managed Care, Other (non HMO) | Attending: Psychiatry | Admitting: Psychiatry

## 2015-09-07 VITALS — BP 130/80 | HR 75 | Temp 98.8°F | Ht 66.0 in | Wt 242.9 lb

## 2015-09-07 DIAGNOSIS — F32A Depression, unspecified: Secondary | ICD-10-CM

## 2015-09-07 DIAGNOSIS — Z23 Encounter for immunization: Secondary | ICD-10-CM | POA: Diagnosis not present

## 2015-09-07 DIAGNOSIS — F329 Major depressive disorder, single episode, unspecified: Secondary | ICD-10-CM

## 2015-09-07 DIAGNOSIS — F418 Other specified anxiety disorders: Secondary | ICD-10-CM | POA: Diagnosis not present

## 2015-09-07 DIAGNOSIS — F419 Anxiety disorder, unspecified: Principal | ICD-10-CM

## 2015-09-07 MED ORDER — CLONAZEPAM 0.5 MG PO TABS: 0.5000 mg | ORAL_TABLET | Freq: Every day | ORAL | Status: DC

## 2015-09-07 MED ORDER — CITALOPRAM HYDROBROMIDE 20 MG PO TABS: 20.0000 mg | ORAL_TABLET | Freq: Every day | ORAL | Status: DC

## 2015-09-07 NOTE — Telephone Encounter (Signed)
Pt calling to voice concerns that her needs were not met today. States that [place] only offered her one day off of work (OV note states a week). States she feels hopeless and that her job is the cause of her stress and depression. She needs an extended period of time off to "get herself worked out". I advised her that for a time period longer than that, she would need to file short term disability. She became more upset and said that infectious disease and behavioral health advised her that her PCP office needed to do this for her. She felt that Tommi Rumps would not do this. She states that she dropped the letter Tommi Rumps gave her to be out of work off with an Forensic psychologist. She felt like I was 'siding with Tommi Rumps' and was not helping her or hearing her. I asked her how we could help her and what her expectations were at her visit today. She could not tell me anything other than she needed an extended period of time off from work. She asked who she could speak to so that she could make a complaint and I provided her with the number for Patient Experience. She asked my name and the site managers name and then hung up.

## 2015-09-07 NOTE — Telephone Encounter (Signed)
Letter will be ready for pick-up 09/08/15, 6 wks out-of-work.  Pt will pick up in the morning.  Thanked Dr. [name] for understanding.

## 2015-09-07 NOTE — BH Assessment (Signed)
Assessment Note  [name] is an 49 y.o. female that reports job related stress, depression and anxiety.  Patient denies SI/HI/Psychosis/Substance Abuse.  Patient reports working in a, "hostile work environment".  Patient reports that her supervisor and other co-workers found out that she has HIV Disease and are now discriminatory towards her.    Patient reports that she has been living with the HIV Disease since 2005 when she contracted the diseased from her child's father.  Patient reports that since a new supervisor was transferred into her unit she has been treating her in an unfair manner that has caused the patient, "anxiety, stress, depression and overwhelming feeling of hopelessness.  Patient reports that she did not experience this much depression when she found out that she was positive for the HIV.   Patient reports that she has been to her HR Department due to someone telling everyone in the office that she has the HIV Disease.  Patient denies prior psychiatric hospitalization.  Patient denies medication management.  Patient denies physical, sexual or emotional abuse.  Per Mickel Baas, NP - patient does not meet criteria for inpatient hospitalization.  Patient referred to outpatient mental health services with [facility].  Patient has an appointment scheduled on 09-14-2015 at 8:45am.    Diagnosis: Adjustment Disorder   Past Medical History:  Past Medical History  Diagnosis Date  . Hyperthyroidism 08/2006    Grave's Disease -diffuse radiotracer uptake 08/25/06 Thyroid scan-Cold nodule to R lower lobe of thyrorid  . HIV DISEASE 07/24/2006    dx March 05  . Sarcoidosis ([place]) 02/08/2007    dx as a teenager in [place] from abnl CXR. Completed 2 yrs Prednisone after lung bx confirmation. No symptoms since then.  . THYROID NODULE, RIGHT 02/08/2007  . GRAVE'S DISEASE 01/01/2008  . 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 HYPERTENSION 07/24/2006  . ASTHMA 07/24/2006  . GERD  07/24/2006  . 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 Anemia     Normocytic  . History of hidradenitis suppurativa   . Gastroenteritis 07/10/07  . Menometrorrhagia     hx of  . History of thrush   . Bronchitis 2005  . Pneumonia 2005  . Nephrolithiasis     Past Surgical History  Procedure Laterality Date  . Total thyroidectomy  2010  . [place]  1994  . Dilation and curettage of uterus  Feb 2004    s/p for 1st trimester nonviable pregnancy  . Incision and drainage peritonsillar abscess  Mar 03  . Incise and drain abcess  Nov 03    s/p I &D for righ inframmary fold hidradenitis  . Irrigation and debridement abscess  01/31/2012    Procedure: IRRIGATION AND DEBRIDEMENT ABSCESS;  Surgeon: Shann Medal, MD;  Location: [facility];  Service: General;  Laterality: Right;  right breast and axilla   . Eye surgery      sty under eyelid  . Nephrolithotomy  10/02/2012    Procedure: NEPHROLITHOTOMY PERCUTANEOUS;  Surgeon: Alexis Frock, MD;  Location: [facility];  Service: Urology;  Laterality: Right;  First Stage Percutaneous Nephrolithotomy with Surgeon Access, Left Ureteral Stent    . Cystoscopy with stent placement  10/02/2012    Procedure: CYSTOSCOPY WITH STENT PLACEMENT;  Surgeon: Alexis Frock, MD;  Location: [facility];  Service: Urology;  Laterality: Left;  . Nephrolithotomy  10/04/2012    Procedure: NEPHROLITHOTOMY PERCUTANEOUS SECOND LOOK;  Surgeon: Alexis Frock, MD;  Location: [facility];  Service: Urology;  Laterality: Right;     .  Nephrolithotomy  10/08/2012    Procedure: NEPHROLITHOTOMY PERCUTANEOUS;  Surgeon: Alexis Frock, MD;  Location: [facility];  Service: Urology;  Laterality: Right;  THIRD STAGE, nephrostomy tube exchange x 2  . Nephrolithotomy  10/11/2012    Procedure: NEPHROLITHOTOMY PERCUTANEOUS SECOND LOOK;  Surgeon: Alexis Frock, MD;  Location: [facility];  Service: Urology;  Laterality: Right;  RIGHT 4 STAGE PERCUTANOUS NEPHROLITHOTOMY, right URETEROSCOPY WITH HOLMIUM LASER   .  Cystoscopy with retrograde pyelogram, ureteroscopy and stent placement  11/09/2012    Procedure: [place], URETEROSCOPY AND STENT PLACEMENT;  Surgeon: Alexis Frock, MD;  Location: [facility];  Service: Urology;  Laterality: Left;  LEFT URETEROSCOPY, STONE MANIPULATION, left STENT exchange   . Cystoscopy w/ ureteral stent removal  11/09/2012    Procedure: CYSTOSCOPY WITH STENT REMOVAL;  Surgeon: Alexis Frock, MD;  Location: [facility];  Service: Urology;  Laterality: Right;  . Breast surgery  1997    Breast Reduction     Family History:  Family History  Problem Relation Age of Onset  . Cancer Other     FH of Breast Cancer  . Cancer Other     Fh of Lung Cancer  . Heart disease Other   . Hypertension Other   . Stroke Other     Grandparent  . Kidney disease Other     Grandparent  . Diabetes Other     FH of Diabetes  . Hypertension Mother   . Cancer Mother     throat  . Hypertension Father   . Hypertension Sister   . Cancer Maternal Uncle     Stomach Cancer    Social History:  reports that she has been smoking Cigarettes.  She has a 7.5 pack-year smoking history. She has never used smokeless tobacco. She reports that she does not drink alcohol or use illicit drugs.  Additional Social History:  Alcohol / Drug Use History of alcohol / drug use?: No history of alcohol / drug abuse  CIWA:   COWS:    Allergies:  Allergies  Allergen Reactions  . Tape Rash    Rash   . Genvoya [Elviteg-Cobic-Emtricit-Tenofaf] Hives  . Lisinopril Cough  . Xylocaine [Lidocaine]     Unknown , patient states she has had lidocaine for IV starts and at the dentist with no problems    Home Medications:  (Not in a hospital admission)  OB/GYN Status:  No LMP recorded. Patient is not currently having periods (Reason: Premenopausal).  General Assessment Data Location of Assessment: [facility] ([facility]) TTS Assessment: In system Is this a Tele or  Face-to-Face Assessment?: Face-to-Face Is this an Initial Assessment or a Re-assessment for this encounter?: Initial Assessment Marital status: Single Maiden name: NA Is patient pregnant?: No Pregnancy Status: No Living Arrangements: Children (Lives with her adult daughter ) Can pt return to current living arrangement?: Yes Admission Status: Voluntary Is patient capable of signing voluntary admission?: Yes Referral Source: Self/Family/Friend Insurance type: Ship broker Exam ([place]) Medical Exam completed: No Reason for MSE not completed:  (Signed decline MSE Form)  Crisis Care Plan Living Arrangements: Children (Lives with her adult daughter ) Name of Psychiatrist: None Reported Name of Therapist: None Reported  Education Status Is patient currently in school?: No Current Grade: NA Highest grade of school patient has completed: [employer] Name of school: NA Contact person: NA  Risk to self with the past 6 months Suicidal Ideation: No Has patient been a risk to  self within the past 6 months prior to admission? : No Suicidal Intent: No Has patient had any suicidal intent within the past 6 months prior to admission? : No Is patient at risk for suicide?: No Suicidal Plan?: No Has patient had any suicidal plan within the past 6 months prior to admission? : No Access to Means: No What has been your use of drugs/alcohol within the last 12 months?: NA Previous Attempts/Gestures: No How many times?: 0 Other Self Harm Risks: NA Triggers for Past Attempts: None known Intentional Self Injurious Behavior: None Family Suicide History: No Recent stressful life event(s): Other (Comment) (Job related stress) Persecutory voices/beliefs?: No Depression: Yes Depression Symptoms: Despondent, Insomnia, Tearfulness, Isolating, Guilt, Fatigue, Loss of interest in usual pleasures, Feeling worthless/self pity, Feeling angry/irritable Substance abuse history and/or treatment  for substance abuse?: No Suicide prevention information given to non-admitted patients: Not applicable  Risk to Others within the past 6 months Homicidal Ideation: No Does patient have any lifetime risk of violence toward others beyond the six months prior to admission? : No Thoughts of Harm to Others: No Current Homicidal Intent: No Current Homicidal Plan: No Access to Homicidal Means: No Identified Victim: NA History of harm to others?: No Assessment of Violence: None Noted Violent Behavior Description: NA Does patient have access to weapons?: No Criminal Charges Pending?: No Does patient have a court date: No Is patient on probation?: No  Psychosis Hallucinations: None noted Delusions: None noted  Mental Status Report Appearance/Hygiene: Unremarkable Eye Contact: Good Motor Activity: Freedom of movement Speech: Logical/coherent Level of Consciousness: Alert, Restless Mood: Depressed, Anxious Affect: Appropriate to circumstance Anxiety Level: Minimal Thought Processes: Relevant, Coherent Judgement: Unimpaired Orientation: Person, Place, Time, Situation Obsessive Compulsive Thoughts/Behaviors: None  Cognitive Functioning Concentration: Decreased Memory: Remote Intact, Recent Intact IQ: Average Insight: Fair Impulse Control: Good Appetite: Poor Weight Loss: 0 Weight Gain: 0 Sleep: Decreased Total Hours of Sleep: 5 Vegetative Symptoms: Staying in bed  ADLScreening [facility] Assessment Services) Patient's cognitive ability adequate to safely complete daily activities?: Yes Patient able to express need for assistance with ADLs?: No Independently performs ADLs?: Yes (appropriate for developmental age)  Prior Inpatient Therapy Prior Inpatient Therapy: No Prior Therapy Dates: NA Prior Therapy Facilty/Provider(s): NA Reason for Treatment: NA  Prior Outpatient Therapy Prior Outpatient Therapy: No Prior Therapy Dates: NA Prior Therapy Facilty/Provider(s): NA Reason  for Treatment: NA Does patient have an ACCT team?: No Does patient have Intensive In-House Services?  : No Does patient have [facility] services? : No Does patient have P4CC services?: No  ADL Screening (condition at time of admission) Patient's cognitive ability adequate to safely complete daily activities?: Yes Is the patient deaf or have difficulty hearing?: No Does the patient have difficulty seeing, even when wearing glasses/contacts?: No Does the patient have difficulty concentrating, remembering, or making decisions?: Yes Patient able to express need for assistance with ADLs?: No Does the patient have difficulty dressing or bathing?: No Independently performs ADLs?: Yes (appropriate for developmental age) Does the patient have difficulty walking or climbing stairs?: No Weakness of Legs: None Weakness of Arms/Hands: None  Home Assistive Devices/Equipment Home Assistive Devices/Equipment: None    Abuse/Neglect Assessment (Assessment to be complete while patient is alone) Physical Abuse: Denies Verbal Abuse: Denies Sexual Abuse: Denies Exploitation of patient/patient's resources: Denies Self-Neglect: Denies Values / Beliefs Cultural Requests During Hospitalization: None Spiritual Requests During Hospitalization: None Consults Spiritual Care Consult Needed: No Social Work Consult Needed: No Regulatory affairs officer (For Healthcare) Does patient have an  advance directive?: No Would patient like information on creating an advanced directive?: No - patient declined information    Additional Information 1:1 In Past 12 Months?: No CIRT Risk: No Elopement Risk: No     Disposition: Patient reports that she has been to her HR Department due to someone telling everyone in the office that she has the HIV Disease.  Patient denies prior psychiatric hospitalization.  Patient denies medication management.  Patient denies physical, sexual or emotional abuse.  Per Mickel Baas, NP - patient does not  meet criteria for inpatient hospitalization.  Patient referred to outpatient mental health services with [facility].  Patient has an appointment scheduled on 09-14-2015 at 8:45am.   Patient signed the decline MSE Form. Writer provided the patient a letter stating that she was seen at [facility]   Disposition Initial Assessment Completed for this Encounter: Yes Disposition of Patient: Other dispositions Other disposition(s):  (Per Mickel Baas, NP - patient does not meet criteria for inpatient)  On Site Evaluation by:   Reviewed with Physician:    Graciella Freer [place] 09/07/2015 1:08 PM

## 2015-09-07 NOTE — Patient Instructions (Addendum)
Take the Celexa in the morning, every morning  Take the Klonopin in the evening  Follow up with psychiatry.   If you have any thoughts of hurting yourself, go straight to the ER.   Follow up in 3 weeks

## 2015-09-07 NOTE — Progress Notes (Signed)
Pre visit review using our clinic review tool, if applicable. No additional management support is needed unless otherwise documented below in the visit note. 

## 2015-09-07 NOTE — Telephone Encounter (Signed)
Pt is requesting a letter to put her out-of-work.  She has a return appointment to begin outpatient therapy 09/14/15.  She was not seen by her regular PCP when she was seen recently so that she was not given a letter.  Was seen at [facility] today.  Pt states that she is unable to work due to her anxiety/depression.  MD please advise.

## 2015-09-07 NOTE — Progress Notes (Addendum)
Subjective:    Patient ID: [name], female    DOB: 07-01-1966, 49 y.o.   MRN: [id number]  HPI  49 year old tearful female who presents to the office today for depression and anxiety over the last year but has been worse over the last few weeks. 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 She reports that she is going to work but feels like she cannot concentrate and is making mistakes, when she goes home for the day she goes straight to bed. " If it wasn't for my daughter I would not get out of bed. When I am there by myself on the weekends all I do is sleep." She has no thoughts of suicide or self harm  She endorses currently looking for a new job.     Review of Systems  Constitutional: Positive for activity change, appetite change and fatigue. Negative for fever, diaphoresis and unexpected weight change.  HENT: Negative.   Respiratory: Negative.   Cardiovascular: Negative.   Neurological: Negative.   Psychiatric/Behavioral: Positive for sleep disturbance and decreased concentration. Negative for suicidal ideas. The patient is nervous/anxious.        Depressed       Objective:   Physical Exam  Constitutional: She is oriented to person, place, and time. She appears well-developed and well-nourished. She appears distressed (tearful and appears sad).  Cardiovascular: Normal rate, regular rhythm, normal heart sounds and intact distal pulses.  Exam reveals no gallop and no friction rub.   No murmur heard. Pulmonary/Chest: Effort normal and breath sounds normal. No respiratory distress. She has no wheezes. She exhibits no tenderness.  Musculoskeletal: Normal range of motion. She exhibits no edema or tenderness.  Neurological: She is alert and oriented to person, place, and time.  Skin: Skin is warm and dry. No rash noted. She is not diaphoretic. No erythema. No pallor.    Psychiatric: She has a normal mood and affect. Her behavior is normal. Judgment and thought content normal.  Vitals reviewed.     Assessment & Plan:  1. Anxiety and depression - citalopram (CELEXA) 20 MG tablet; Take 1 tablet (20 mg total) by mouth daily.  Dispense: 30 tablet; Refill: 3 - clonazePAM (KLONOPIN) 0.5 MG tablet; Take 1 tablet (0.5 mg total) by mouth at bedtime.  Dispense: 7 tablet; Refill: 0 - List of psychiatrists given to patient to follow up with.    - Patient asked for work note for an extended period of time so that she would not have to go back to work. I advised patient that I would write her off from work for a few days and given that this is thanksgiving week, she would have off till Monday. When I returned to give her the note, patient was visibly upset and states " I don't know what I have to do to get someone to listen to me. I don't want to go back to work because it makes me depressed and have anxiety." I told patient that I was not going to write her off for any more time and that I felt that although she did not like where she worked it would be good for her to get out of the house. Patient continued to be upset and states " you just don't care and you don't understand." Patient was told that if psychiatry felt like she needed to  be off work then they could write her a note. Encouraged to find a new job as soon as possible .Asked patient to follow up with me in 3 weeks." I am not going to promise you that I will follow up with you, I will check myself into a mental institution if I have to."  2. Encounter for immunization - Flu shot given

## 2015-09-07 NOTE — Telephone Encounter (Signed)
Walk-in - Anxiety / Depression.  Pt crying.  Talked with her PCP about the problem.  Not adequately addressed.  Requesting assistance with the problems today.  Pt given information about [facility] Assessment [facility].  Pt's daughter will drive her to [facility] for assessment.

## 2015-09-16 ENCOUNTER — Telehealth: Payer: Self-pay | Admitting: *Deleted

## 2015-09-16 NOTE — Telephone Encounter (Signed)
Walk-in to [place] Disability/ FMLA paperwork.  Pt brought in paperwork for completion and signature.  FMLA paperwork will need picked up by the pt to return to her employer.  Short Term Disability paperwork will need to be faxed to [facility] when completed.  Pt requested copies of these papers for her files.  Paperwork placed in Dr. Hale Bogus box for signature.

## 2015-09-22 ENCOUNTER — Ambulatory Visit: Payer: Self-pay

## 2015-09-23 ENCOUNTER — Other Ambulatory Visit: Payer: Managed Care, Other (non HMO)

## 2015-09-23 ENCOUNTER — Ambulatory Visit: Payer: Managed Care, Other (non HMO)

## 2015-09-23 DIAGNOSIS — F411 Generalized anxiety disorder: Secondary | ICD-10-CM

## 2015-09-23 DIAGNOSIS — B20 Human immunodeficiency virus [HIV] disease: Secondary | ICD-10-CM

## 2015-09-23 DIAGNOSIS — F321 Major depressive disorder, single episode, moderate: Secondary | ICD-10-CM

## 2015-09-23 DIAGNOSIS — F410 Panic disorder [episodic paroxysmal anxiety] without agoraphobia: Secondary | ICD-10-CM

## 2015-09-23 LAB — CBC
HEMATOCRIT: 39.7 % (ref 36.0–46.0)
Hemoglobin: 13.3 g/dL (ref 12.0–15.0)
MCH: 28.9 pg (ref 26.0–34.0)
MCHC: 33.5 g/dL (ref 30.0–36.0)
MCV: 86.1 fL (ref 78.0–100.0)
MPV: 8.8 fL (ref 8.6–12.4)
PLATELETS: 369 10*3/uL (ref 150–400)
RBC: 4.61 MIL/uL (ref 3.87–5.11)
RDW: 15.2 % (ref 11.5–15.5)
WBC: 6.9 10*3/uL (ref 4.0–10.5)

## 2015-09-23 LAB — COMPREHENSIVE METABOLIC PANEL
ALK PHOS: 83 U/L (ref 33–115)
ALT: 29 U/L (ref 6–29)
AST: 24 U/L (ref 10–35)
Albumin: 3.9 g/dL (ref 3.6–5.1)
BILIRUBIN TOTAL: 0.4 mg/dL (ref 0.2–1.2)
BUN: 14 mg/dL (ref 7–25)
CALCIUM: 9.8 mg/dL (ref 8.6–10.2)
CO2: 27 mmol/L (ref 20–31)
CREATININE: 1.02 mg/dL (ref 0.50–1.10)
Chloride: 102 mmol/L (ref 98–110)
GLUCOSE: 158 mg/dL — AB (ref 65–99)
Potassium: 4 mmol/L (ref 3.5–5.3)
Sodium: 140 mmol/L (ref 135–146)
Total Protein: 7.6 g/dL (ref 6.1–8.1)

## 2015-09-23 NOTE — BH Specialist Note (Signed)
I met with [name] for the first time today and she reports multiple symptoms of anxiety and depression, including poor sleep, depressed mood, crying spells, poor concentration, feelings of worthlessness, low energy. She also reports having panic attacks, the last one being a week ago.  She has daily anxiety at a 7 on a 10 pt scale.  Her mood is around 5 on a 10 pt scale.  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  This is partly due to someone at work discovering her HIV status and spreading that information in the office.  She reports she has a good support system.  She lives with her 94 year old daughter who is attending [employer].  I gave her some psycho-education on depression and on breathing techniques to reduce anxiety.  Plan to meet again in 2 weeks. Curley Spice, LCSW

## 2015-09-24 LAB — HIV-1 RNA QUANT-NO REFLEX-BLD: HIV-1 RNA Quant, Log: <1.3 Log copies/mL (ref <1.30)

## 2015-09-24 LAB — T-HELPER CELL (CD4) - (RCID CLINIC ONLY)
CD4 % Helper T Cell: 39 % (ref 33–55)
CD4 T Cell Abs: 1040 /uL (ref 400–2700)

## 2015-10-06 ENCOUNTER — Ambulatory Visit: Payer: Managed Care, Other (non HMO)

## 2015-10-06 ENCOUNTER — Ambulatory Visit: Payer: PRIVATE HEALTH INSURANCE | Admitting: Internal Medicine

## 2015-10-06 DIAGNOSIS — F411 Generalized anxiety disorder: Secondary | ICD-10-CM

## 2015-10-06 NOTE — BH Specialist Note (Signed)
[name] was tearful at times today as she talked about the anxiety of returning to work after the holidays.  She reports she has been lying in bed and not leaving the house since this past Sunday, when she went to church.  I provided psycho-education on cognitive behavior therapy (CBT) and also on mindfulness, facilitating a guided meditation to help her learn to quiet racing thoughts.  Plan to meet again in one week. Curley Spice, LCSW

## 2015-10-07 ENCOUNTER — Ambulatory Visit (INDEPENDENT_AMBULATORY_CARE_PROVIDER_SITE_OTHER): Payer: Managed Care, Other (non HMO) | Admitting: Internal Medicine

## 2015-10-07 VITALS — BP 159/95 | HR 86 | Temp 97.5°F | Wt 239.8 lb

## 2015-10-07 DIAGNOSIS — N611 Abscess of the breast and nipple: Secondary | ICD-10-CM

## 2015-10-07 DIAGNOSIS — F17200 Nicotine dependence, unspecified, uncomplicated: Secondary | ICD-10-CM

## 2015-10-07 DIAGNOSIS — F329 Major depressive disorder, single episode, unspecified: Secondary | ICD-10-CM | POA: Diagnosis not present

## 2015-10-07 DIAGNOSIS — F32A Depression, unspecified: Secondary | ICD-10-CM

## 2015-10-07 DIAGNOSIS — B20 Human immunodeficiency virus [HIV] disease: Secondary | ICD-10-CM

## 2015-10-07 NOTE — Assessment & Plan Note (Signed)
She has responded well to a recent round of empiric cephalexin.

## 2015-10-07 NOTE — Assessment & Plan Note (Signed)
She has recently relapsed into smoking cigarettes again. She is getting strong pressure from her daughter to quit again and I encouraged her to try. This is especially important given her chronic asthma.

## 2015-10-07 NOTE — Progress Notes (Signed)
Patient Active Problem List   Diagnosis Date Noted  . Human immunodeficiency virus (HIV) disease ([place]) 07/24/2006    Priority: High  . Depression 07/24/2006    Priority: High  . Hyperglycemia 04/15/2015    Priority: Medium  . Hidradenitis suppurativa of right >> left axilla 01/30/2012    Priority: Medium  . 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 GRAVE'S DISEASE 01/01/2008    Priority: Medium  . TOBACCO USER 02/08/2007    Priority: Medium  . Essential hypertension 07/24/2006    Priority: Medium  . ASTHMA 07/24/2006    Priority: Medium  . Type 2 diabetes mellitus, uncontrolled ([place]) 06/23/2015  . Renal calculi 10/29/2014  . Recurrent boils 06/11/2014  . Abscess of breast, right 01/09/2013  . Vaginal discharge 02/09/2012  . SARCOIDOSIS 02/08/2007  . THYROID NODULE, RIGHT 02/08/2007  . 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 GERD 07/24/2006    Patient's Medications  New Prescriptions   No medications on file  Previous Medications   ACETAMINOPHEN (TYLENOL) 500 MG TABLET    Take 1,000 mg by mouth every 6 (six) hours as needed for mild pain.   ALBUTEROL (PROVENTIL HFA;VENTOLIN HFA) 108 (90 BASE) MCG/ACT INHALER    Inhale 2 puffs into the lungs every 6 (six) hours as needed for wheezing or shortness of breath.   AMLODIPINE (NORVASC) 5 MG TABLET    TAKE 1 TABLET (5 MG TOTAL) BY MOUTH DAILY.   EMTRICITABINE-RILPIVIR-TENOFOVIR AF (ODEFSEY) 200-25-25 MG TABS PER TABLET    Take 1 tablet by mouth daily.   FLUTICASONE (FLONASE) 50 MCG/ACT NASAL SPRAY    Place into both nostrils daily. Reported on 10/07/2015   GLUCOSE BLOOD (ONETOUCH VERIO) TEST STRIP    USE TO CHECK BLOOD SUGAR DAILY AND PRN   HYDROCHLOROTHIAZIDE (MICROZIDE) 12.5 MG CAPSULE    TAKE ONE CAPSULE BY MOUTH EVERY DAY   LEVOTHYROXINE (SYNTHROID, LEVOTHROID) 200 MCG TABLET    TAKE 1 TABLET (200 MCG TOTAL) BY MOUTH DAILY.   METFORMIN (GLUCOPHAGE) 500 MG TABLET    Take 1 tablet (500 mg  total) by mouth 2 (two) times daily with a meal.   MULTIPLE VITAMIN (MULTIVITAMIN) TABLET    Take 1 tablet by mouth daily.   NIFEDIPINE (PROCARDIA XL/ADALAT-CC) 60 MG 24 HR TABLET    TAKE 1 TABLET (60 MG TOTAL) BY MOUTH DAILY.   ONE TOUCH LANCETS MISC    USE TO CHECK BLOOD SUGAR DAILY AND PRN   POTASSIUM CHLORIDE (K-DUR) 10 MEQ TABLET    Take 1 tablet (10 mEq total) by mouth daily.  Modified Medications   No medications on file  Discontinued Medications   AZITHROMYCIN (ZITHROMAX) 250 MG TABLET    Take 2 tabs on day 1, then 1 tab daily until finished   CEPHALEXIN (KEFLEX) 500 MG CAPSULE    Take 1 capsule (500 mg total) by mouth 3 (three) times daily.   CITALOPRAM (CELEXA) 20 MG TABLET    Take 1 tablet (20 mg total) by mouth daily.   CLONAZEPAM (KLONOPIN) 0.5 MG TABLET    Take 1 tablet (0.5 mg total) by mouth at bedtime.   FLUCONAZOLE (DIFLUCAN) 150 MG TABLET    Take 1 tablet (150 mg total) by mouth once.   KETOROLAC (TORADOL) 10 MG TABLET    Take 1 tablet (10 mg total) by mouth every 6 (six) hours as needed.    Subjective: Anayancy is in for her routine HIV follow-up visit.  She recently had a Psychologist, educational at work and she believes at [employer] breached her privacy and told other coworkers about her HIV status. She states that this was confirmed by one of her coworkers. She spoke with her HR department but they said that the manager denied this. Since this happened Mica has been extremely distraught. She has been very anxious and depressed. She's had difficulty concentrating and sleeping. She has uncontrollable bouts of crying. She was given prescriptions for citalopram and clonazepam one month ago by her primary care provider but chose not to start them because of potential side effects. She has started counseling with our behavioral health counselor, Curley Spice, and finds that to be very helpful.  When this first occurred she did miss 2-3 doses of her Odefsey. She then sat her phone alarm to remind  her and she has not missed any doses recently. Unfortunately she did return to smoking cigarettes.  She continues to be bothered by hidradenitis and recurrent boils. She recently completed a course of cephalexin for an area of infection on her right breast.  Review of Systems: Review of Systems  Constitutional: Positive for malaise/fatigue. Negative for fever, chills, weight loss and diaphoresis.  HENT: Negative for sore throat.   Respiratory: Negative for cough, sputum production and shortness of breath.   Cardiovascular: Negative for chest pain.  Gastrointestinal: Negative for nausea, vomiting and diarrhea.  Musculoskeletal: Negative for joint pain.  Skin: Negative for rash.  Psychiatric/Behavioral: Positive for depression. Negative for substance abuse. The patient is nervous/anxious.     Past Medical History  Diagnosis Date  . Hyperthyroidism 08/2006    Grave's Disease -diffuse radiotracer uptake 08/25/06 Thyroid scan-Cold nodule to R lower lobe of thyrorid  . HIV DISEASE 07/24/2006    dx March 05  . Sarcoidosis ([place]) 02/08/2007    dx as a teenager in [place] from abnl CXR. Completed 2 yrs Prednisone after lung bx confirmation. No symptoms since then.  . THYROID NODULE, RIGHT 02/08/2007  . GRAVE'S DISEASE 01/01/2008  . 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 HYPERTENSION 07/24/2006  . ASTHMA 07/24/2006  . GERD 07/24/2006  . 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 Anemia     Normocytic  . History of hidradenitis suppurativa   . Gastroenteritis 07/10/07  . Menometrorrhagia     hx of  . History of thrush   . Bronchitis 2005  . Pneumonia 2005  . Nephrolithiasis     Social History  Substance Use Topics  . Smoking status: Current Some Day Smoker -- 0.50 packs/day for 15 years    Types: Cigarettes  . Smokeless tobacco: Never Used     Comment: will stop on 06/21/14  . Alcohol Use: No    Family History  Problem Relation Age of Onset  . Cancer Other     FH of Breast  Cancer  . Cancer Other     Fh of Lung Cancer  . Heart disease Other   . Hypertension Other   . Stroke Other     Grandparent  . Kidney disease Other     Grandparent  . Diabetes Other     FH of Diabetes  . Hypertension Mother   . Cancer Mother     throat  . Hypertension Father   . Hypertension Sister   . Cancer Maternal Uncle     Stomach Cancer    Allergies  Allergen Reactions  . Tape Rash    Rash   . Genvoya [Elviteg-Cobic-Emtricit-Tenofaf] Hives  . Lisinopril Cough  .  Xylocaine [Lidocaine]     Unknown , patient states she has had lidocaine for IV starts and at the dentist with no problems    Objective:  Filed Vitals:   10/07/15 1434  BP: 159/95  Pulse: 86  Temp: 97.5 F (36.4 C)  TempSrc: Oral  Weight: 239 lb 12.8 oz (108.773 kg)   Body mass index is 38.72 kg/(m^2).  Physical Exam  Constitutional: She is oriented to person, place, and time.  She is smiling but intermittently tearful during the exam.  HENT:  Mouth/Throat: No oropharyngeal exudate.  Eyes: Conjunctivae are normal.  Cardiovascular: Normal rate and regular rhythm.   No murmur heard. Pulmonary/Chest: Breath sounds normal.    Abdominal: Soft. She exhibits no mass. There is no tenderness.  Musculoskeletal: Normal range of motion.  Neurological: She is alert and oriented to person, place, and time.  Skin: No rash noted.  Psychiatric: Mood and affect normal.    Lab Results Lab Results  Component Value Date   WBC 6.9 09/23/2015   HGB 13.3 09/23/2015   HCT 39.7 09/23/2015   MCV 86.1 09/23/2015   PLT 369 09/23/2015    Lab Results  Component Value Date   CREATININE 1.02 09/23/2015   BUN 14 09/23/2015   NA 140 09/23/2015   K 4.0 09/23/2015   CL 102 09/23/2015   CO2 27 09/23/2015    Lab Results  Component Value Date   ALT 29 09/23/2015   AST 24 09/23/2015   ALKPHOS 83 09/23/2015   BILITOT 0.4 09/23/2015    Lab Results  Component Value Date   CHOL 186 10/23/2013   HDL 40  10/23/2013   LDLCALC 120* 10/23/2013   TRIG 132 10/23/2013   CHOLHDL 4.7 10/23/2013    Lab Results HIV 1 RNA QUANT (copies/mL)  Date Value  09/23/2015 <20  03/17/2015 <20  05/29/2014 <20   CD4 T CELL ABS (/uL)  Date Value  09/23/2015 1040  03/17/2015 790  05/29/2014 1180      Problem List Items Addressed This Visit      High   Depression    She has acute depression and anxiety. I believe she probably has some form of traumatic stress disorder related to her work environment. She has improved since being out of work and entering counseling. I will arrange a follow-up counseling visit next week and she will return to see me in one month.      Human immunodeficiency virus (HIV) disease ([place]) - Primary    Her HIV infection remains under excellent control. Her adherence has self corrected after a few recent misses. I will continue Odefsey and see her back in one month.        Medium   TOBACCO USER    She has recently relapsed into smoking cigarettes again. She is getting strong pressure from her daughter to quit again and I encouraged her to try. This is especially important given her chronic asthma.        Unprioritized   Abscess of breast, right    She has responded well to a recent round of empiric cephalexin.           Michel Bickers, MD [facility] [place] [facility] [phone] pager   [phone] cell 10/07/2015, 6:05 PM

## 2015-10-07 NOTE — Assessment & Plan Note (Signed)
Her HIV infection remains under excellent control. Her adherence has self corrected after a few recent misses. I will continue Odefsey and see her back in one month.

## 2015-10-07 NOTE — Assessment & Plan Note (Signed)
She has acute depression and anxiety. I believe she probably has some form of traumatic stress disorder related to her work environment. She has improved since being out of work and entering counseling. I will arrange a follow-up counseling visit next week and she will return to see me in one month.

## 2015-10-15 ENCOUNTER — Ambulatory Visit: Payer: Managed Care, Other (non HMO)

## 2015-10-15 DIAGNOSIS — F411 Generalized anxiety disorder: Secondary | ICD-10-CM

## 2015-10-15 NOTE — BH Specialist Note (Signed)
[name] was tearful at times today as she talked about the trauma she experienced several years ago when she was diagnosed HIV+.  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  She said she told her "I've only had 3 partners in my life!"  I talked with her about EMDR and suggested she look it up online between now and her next session.  I explained some of how it works and suggested we do some guided exercises next session in preparation for EMDR.  Plan to meet in one week. Curley Spice, LCSW

## 2015-10-21 ENCOUNTER — Ambulatory Visit: Payer: Self-pay

## 2015-10-28 ENCOUNTER — Ambulatory Visit: Payer: Managed Care, Other (non HMO)

## 2015-10-28 DIAGNOSIS — F321 Major depressive disorder, single episode, moderate: Secondary | ICD-10-CM

## 2015-10-28 NOTE — BH Specialist Note (Signed)
[name] reported that today was the first time she has left the house since Friday, partly due to the snow, but also due to her depression.  We discussed how depression can feel like being in a hole.  I talked to her about psychiatric medication, but she wants to avoid this if possible.  I facilitated the "container" and "safe place" guided imagery exercises and she responded very well, saying she really enjoyed them.  Plan to meet again in one week. Curley Spice, LCSW

## 2015-10-30 ENCOUNTER — Telehealth: Payer: Self-pay | Admitting: *Deleted

## 2015-10-30 NOTE — Telephone Encounter (Signed)
Faxed paperwork to [place]

## 2015-11-04 ENCOUNTER — Telehealth: Payer: Self-pay | Admitting: *Deleted

## 2015-11-04 NOTE — Telephone Encounter (Signed)
Patient left message in triage regarding medical records.  RN attempted to return call, no answer and no voicemail set up. Landis Gandy, RN

## 2015-11-05 ENCOUNTER — Other Ambulatory Visit: Payer: Self-pay | Admitting: Family

## 2015-11-05 ENCOUNTER — Ambulatory Visit: Payer: Managed Care, Other (non HMO)

## 2015-11-05 DIAGNOSIS — F411 Generalized anxiety disorder: Secondary | ICD-10-CM

## 2015-11-05 NOTE — BH Specialist Note (Signed)
[name] was late for her appointment today due to car trouble, but we made the best use of our time by completing a treatment plan with goals of lowering anxiety and improving mood.  I also reviewed with her that I received a form from her company re: Short Term Disability, which I finished and faxed today.  She says she has been making a concerted effort to get up each day, get showered and dressed, and get out of the house.  She said she went to the movies yesterday with her daughter.  I praised her for this.  I also gave her a DBT handout on assertiveness (cheerleading statements).  Plan to meet in one week. Curley Spice, LCSW

## 2015-11-11 ENCOUNTER — Ambulatory Visit: Payer: Self-pay

## 2015-11-12 ENCOUNTER — Encounter: Payer: Self-pay | Admitting: Internal Medicine

## 2015-11-12 ENCOUNTER — Ambulatory Visit (INDEPENDENT_AMBULATORY_CARE_PROVIDER_SITE_OTHER): Payer: Managed Care, Other (non HMO) | Admitting: Internal Medicine

## 2015-11-12 VITALS — BP 162/101 | HR 89 | Temp 97.8°F | Wt 241.5 lb

## 2015-11-12 DIAGNOSIS — L0293 Carbuncle, unspecified: Secondary | ICD-10-CM

## 2015-11-12 DIAGNOSIS — I10 Essential (primary) hypertension: Secondary | ICD-10-CM

## 2015-11-12 DIAGNOSIS — F17200 Nicotine dependence, unspecified, uncomplicated: Secondary | ICD-10-CM

## 2015-11-12 DIAGNOSIS — F329 Major depressive disorder, single episode, unspecified: Secondary | ICD-10-CM

## 2015-11-12 DIAGNOSIS — E1100 Type 2 diabetes mellitus with hyperosmolarity without nonketotic hyperglycemic-hyperosmolar coma (NKHHC): Secondary | ICD-10-CM

## 2015-11-12 DIAGNOSIS — B20 Human immunodeficiency virus [HIV] disease: Secondary | ICD-10-CM

## 2015-11-12 DIAGNOSIS — E0500 Thyrotoxicosis with diffuse goiter without thyrotoxic crisis or storm: Secondary | ICD-10-CM

## 2015-11-12 DIAGNOSIS — K219 Gastro-esophageal reflux disease without esophagitis: Secondary | ICD-10-CM

## 2015-11-12 DIAGNOSIS — F32A Depression, unspecified: Secondary | ICD-10-CM

## 2015-11-12 NOTE — Progress Notes (Signed)
Patient Active Problem List   Diagnosis Date Noted  . Human immunodeficiency virus (HIV) disease ([place]) 07/24/2006    Priority: High  . Depression 07/24/2006    Priority: High  . Hyperglycemia 04/15/2015    Priority: Medium  . Hidradenitis suppurativa of right >> left axilla 01/30/2012    Priority: Medium  . 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 GRAVE'S DISEASE 01/01/2008    Priority: Medium  . TOBACCO USER 02/08/2007    Priority: Medium  . Essential hypertension 07/24/2006    Priority: Medium  . ASTHMA 07/24/2006    Priority: Medium  . Type 2 diabetes mellitus, uncontrolled ([place]) 06/23/2015  . Renal calculi 10/29/2014  . Recurrent boils 06/11/2014  . Abscess of breast, right 01/09/2013  . Vaginal discharge 02/09/2012  . SARCOIDOSIS 02/08/2007  . THYROID NODULE, RIGHT 02/08/2007  . 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 GERD 07/24/2006    Patient's Medications  New Prescriptions   No medications on file  Previous Medications   ACETAMINOPHEN (TYLENOL) 500 MG TABLET    Take 1,000 mg by mouth every 6 (six) hours as needed for mild pain.   ALBUTEROL (PROVENTIL HFA;VENTOLIN HFA) 108 (90 BASE) MCG/ACT INHALER    Inhale 2 puffs into the lungs every 6 (six) hours as needed for wheezing or shortness of breath.   AMLODIPINE (NORVASC) 5 MG TABLET    TAKE 1 TABLET (5 MG TOTAL) BY MOUTH DAILY.   EMTRICITABINE-RILPIVIR-TENOFOVIR AF (ODEFSEY) 200-25-25 MG TABS PER TABLET    Take 1 tablet by mouth daily.   FLUTICASONE (FLONASE) 50 MCG/ACT NASAL SPRAY    Place into both nostrils daily. Reported on 10/07/2015   GLUCOSE BLOOD (ONETOUCH VERIO) TEST STRIP    USE TO CHECK BLOOD SUGAR DAILY AND PRN   HYDROCHLOROTHIAZIDE (MICROZIDE) 12.5 MG CAPSULE    TAKE ONE CAPSULE BY MOUTH EVERY DAY   LEVOTHYROXINE (SYNTHROID, LEVOTHROID) 200 MCG TABLET    TAKE 1 TABLET (200 MCG TOTAL) BY MOUTH DAILY.   METFORMIN (GLUCOPHAGE) 500 MG TABLET    TAKE 1 TABLET (500 MG  TOTAL) BY MOUTH 2 (TWO) TIMES DAILY WITH A MEAL.   MULTIPLE VITAMIN (MULTIVITAMIN) TABLET    Take 1 tablet by mouth daily.   NIFEDIPINE (PROCARDIA XL/ADALAT-CC) 60 MG 24 HR TABLET    TAKE 1 TABLET (60 MG TOTAL) BY MOUTH DAILY.   ONE TOUCH LANCETS MISC    USE TO CHECK BLOOD SUGAR DAILY AND PRN   POTASSIUM CHLORIDE (K-DUR) 10 MEQ TABLET    Take 1 tablet (10 mEq total) by mouth daily.  Modified Medications   No medications on file  Discontinued Medications   No medications on file    Subjective: Cameran is in for her routine HIV follow-up visit. She states that she is feeling better and her stress, anxiety and depression are coming under better control. She continues to see our behavioral health counselor, Curley Spice, and has found his counseling to be quite valuable. She has been using some of the tools in tips he has provided to try to manage her anxiety. She will be returning to work on 11/16/2015. She states that she is not sure if her return to her old job is for closure or if she will stay in the job. However, she has had 3 recent job interviews and is hoping to find new work with a better work environment. She continues to smoke cigarettes and has actually increased in number  cigarettes daily recently. Her daughter continues to encourage her to quit again. She has 1 small boil under her right breast but states that it is improving and overall the frequency and severity of boils is diminishing. She denies missing any doses of her Genvoya.   Review of Systems: Review of Systems  Constitutional: Negative for fever, chills, weight loss, malaise/fatigue and diaphoresis.  HENT: Negative for sore throat.   Respiratory: Negative for cough, sputum production and shortness of breath.   Cardiovascular: Negative for chest pain.  Gastrointestinal: Negative for nausea, vomiting and diarrhea.  Genitourinary: Negative for dysuria.  Musculoskeletal: Negative for myalgias and joint pain.  Skin: Positive for  rash.       1 small resolving boil under her right breast.  Psychiatric/Behavioral: Positive for depression. Negative for substance abuse. The patient is nervous/anxious. The patient does not have insomnia.     Past Medical History  Diagnosis Date  . Hyperthyroidism 08/2006    Grave's Disease -diffuse radiotracer uptake 08/25/06 Thyroid scan-Cold nodule to R lower lobe of thyrorid  . HIV DISEASE 07/24/2006    dx March 05  . Sarcoidosis ([place]) 02/08/2007    dx as a teenager in [place] from abnl CXR. Completed 2 yrs Prednisone after lung bx confirmation. No symptoms since then.  . THYROID NODULE, RIGHT 02/08/2007  . GRAVE'S DISEASE 01/01/2008  . 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 HYPERTENSION 07/24/2006  . ASTHMA 07/24/2006  . GERD 07/24/2006  . 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 Anemia     Normocytic  . History of hidradenitis suppurativa   . Gastroenteritis 07/10/07  . Menometrorrhagia     hx of  . History of thrush   . Bronchitis 2005  . Pneumonia 2005  . Nephrolithiasis     Social History  Substance Use Topics  . Smoking status: Current Some Day Smoker -- 1.00 packs/day for 15 years    Types: Cigarettes  . Smokeless tobacco: Never Used     Comment: will stop on 06/21/14  . Alcohol Use: No    Family History  Problem Relation Age of Onset  . Cancer Other     FH of Breast Cancer  . Cancer Other     Fh of Lung Cancer  . Heart disease Other   . Hypertension Other   . Stroke Other     Grandparent  . Kidney disease Other     Grandparent  . Diabetes Other     FH of Diabetes  . Hypertension Mother   . Cancer Mother     throat  . Hypertension Father   . Hypertension Sister   . Cancer Maternal Uncle     Stomach Cancer    Allergies  Allergen Reactions  . Tape Rash    Rash   . Genvoya [Elviteg-Cobic-Emtricit-Tenofaf] Hives  . Lisinopril Cough  . Xylocaine [Lidocaine]     Unknown , patient states she has had lidocaine for IV starts and at the dentist  with no problems    Objective:  Filed Vitals:   11/12/15 1333  BP: 162/101  Pulse: 89  Temp: 97.8 F (36.6 C)  TempSrc: Oral  Weight: 241 lb 8 oz (109.544 kg)   Body mass index is 39 kg/(m^2).  Physical Exam  Constitutional: She is oriented to person, place, and time.  She is smiling and in much better spirits than on her last visit.  HENT:  Mouth/Throat: No oropharyngeal exudate.  Eyes: Conjunctivae are normal.  Cardiovascular: Normal rate and regular rhythm.  No murmur heard. Pulmonary/Chest: Breath sounds normal.  Abdominal: Soft. There is no tenderness.  Musculoskeletal: Normal range of motion.  Neurological: She is alert and oriented to person, place, and time.  Skin: No rash noted.  Psychiatric: Mood and affect normal.    Lab Results Lab Results  Component Value Date   WBC 6.9 09/23/2015   HGB 13.3 09/23/2015   HCT 39.7 09/23/2015   MCV 86.1 09/23/2015   PLT 369 09/23/2015    Lab Results  Component Value Date   CREATININE 1.02 09/23/2015   BUN 14 09/23/2015   NA 140 09/23/2015   K 4.0 09/23/2015   CL 102 09/23/2015   CO2 27 09/23/2015    Lab Results  Component Value Date   ALT 29 09/23/2015   AST 24 09/23/2015   ALKPHOS 83 09/23/2015   BILITOT 0.4 09/23/2015    Lab Results  Component Value Date   CHOL 186 10/23/2013   HDL 40 10/23/2013   LDLCALC 120* 10/23/2013   TRIG 132 10/23/2013   CHOLHDL 4.7 10/23/2013    Lab Results HIV 1 RNA QUANT (copies/mL)  Date Value  09/23/2015 <20  03/17/2015 <20  05/29/2014 <20   CD4 T CELL ABS (/uL)  Date Value  09/23/2015 1040  03/17/2015 790  05/29/2014 1180      Problem List Items Addressed This Visit      High   Depression - Primary    Her job related stress, anxiety and depression is improving after time off work and behavioral health counseling. I talked to her about the potential for worsening of her symptoms upon return to work next week. She feels confident that she can use the tools in  tips she has been taught to manage her anxiety. She is also planning on following up for counseling.      Human immunodeficiency virus (HIV) disease ([place])    Her HIV infection remains under perfect control. She will continue Genvoya and follow-up after lab work in 6 months.      Relevant Orders   T-helper cell (CD4)- ([facility] only)   HIV 1 RNA quant-no reflex-bld     Medium   TOBACCO USER    I encouraged her to consider cigarette cessation again once she is back to work and feeling better. She is in agreement with that plan.        Unprioritized   Recurrent boils    She does not have any active boils that require incision and drainage or antibiotic therapy at this time.           Michel Bickers, MD [facility] [place] [facility] [phone] pager   [phone] cell 11/12/2015, 1:56 PM

## 2015-11-12 NOTE — Assessment & Plan Note (Signed)
Her job related stress, anxiety and depression is improving after time off work and behavioral health counseling. I talked to her about the potential for worsening of her symptoms upon return to work next week. She feels confident that she can use the tools in tips she has been taught to manage her anxiety. She is also planning on following up for counseling.

## 2015-11-12 NOTE — Assessment & Plan Note (Signed)
Her HIV infection remains under perfect control. She will continue Genvoya and follow-up after lab work in 6 months.

## 2015-11-12 NOTE — Assessment & Plan Note (Signed)
She does not have any active boils that require incision and drainage or antibiotic therapy at this time.

## 2015-11-12 NOTE — Assessment & Plan Note (Signed)
I encouraged her to consider cigarette cessation again once she is back to work and feeling better. She is in agreement with that plan.

## 2015-12-03 ENCOUNTER — Encounter: Payer: Self-pay | Admitting: Family Medicine

## 2015-12-03 ENCOUNTER — Encounter: Payer: Self-pay | Admitting: Internal Medicine

## 2015-12-03 ENCOUNTER — Ambulatory Visit (INDEPENDENT_AMBULATORY_CARE_PROVIDER_SITE_OTHER): Payer: Managed Care, Other (non HMO) | Admitting: Internal Medicine

## 2015-12-03 VITALS — BP 168/96 | HR 77 | Temp 97.4°F | Wt 243.9 lb

## 2015-12-03 DIAGNOSIS — E119 Type 2 diabetes mellitus without complications: Secondary | ICD-10-CM

## 2015-12-03 DIAGNOSIS — J029 Acute pharyngitis, unspecified: Secondary | ICD-10-CM

## 2015-12-03 DIAGNOSIS — J329 Chronic sinusitis, unspecified: Secondary | ICD-10-CM | POA: Diagnosis not present

## 2015-12-03 DIAGNOSIS — E039 Hypothyroidism, unspecified: Secondary | ICD-10-CM | POA: Diagnosis not present

## 2015-12-03 DIAGNOSIS — I10 Essential (primary) hypertension: Secondary | ICD-10-CM | POA: Diagnosis not present

## 2015-12-03 DIAGNOSIS — Z720 Tobacco use: Secondary | ICD-10-CM

## 2015-12-03 DIAGNOSIS — J069 Acute upper respiratory infection, unspecified: Secondary | ICD-10-CM

## 2015-12-03 LAB — CBC WITH DIFFERENTIAL/PLATELET
BASOS ABS: 0 10*3/uL (ref 0.0–0.1)
Basophils Relative: 0.4 % (ref 0.0–3.0)
EOS ABS: 0.2 10*3/uL (ref 0.0–0.7)
Eosinophils Relative: 2.2 % (ref 0.0–5.0)
HEMATOCRIT: 40 % (ref 36.0–46.0)
Hemoglobin: 13.3 g/dL (ref 12.0–15.0)
LYMPHS ABS: 3.2 10*3/uL (ref 0.7–4.0)
LYMPHS PCT: 36.9 % (ref 12.0–46.0)
MCHC: 33.1 g/dL (ref 30.0–36.0)
MCV: 85.3 fl (ref 78.0–100.0)
MONOS PCT: 7.1 % (ref 3.0–12.0)
Monocytes Absolute: 0.6 10*3/uL (ref 0.1–1.0)
NEUTROS PCT: 53.4 % (ref 43.0–77.0)
Neutro Abs: 4.6 10*3/uL (ref 1.4–7.7)
PLATELETS: 436 10*3/uL — AB (ref 150.0–400.0)
RBC: 4.69 Mil/uL (ref 3.87–5.11)
RDW: 16.4 % — ABNORMAL HIGH (ref 11.5–15.5)
WBC: 8.6 10*3/uL (ref 4.0–10.5)

## 2015-12-03 LAB — BASIC METABOLIC PANEL
BUN: 16 mg/dL (ref 6–23)
CHLORIDE: 102 meq/L (ref 96–112)
CO2: 31 meq/L (ref 19–32)
Calcium: 9.4 mg/dL (ref 8.4–10.5)
Creatinine, Ser: 0.96 mg/dL (ref 0.40–1.20)
GFR: 79.93 mL/min (ref >60.00)
GLUCOSE: 199 mg/dL — AB (ref 70–99)
POTASSIUM: 3.7 meq/L (ref 3.5–5.1)
SODIUM: 140 meq/L (ref 135–145)

## 2015-12-03 LAB — HEMOGLOBIN A1C: Hgb A1c MFr Bld: 6.6 % — ABNORMAL HIGH (ref 4.6–6.5)

## 2015-12-03 LAB — TSH: TSH: 1.06 u[IU]/mL (ref 0.35–4.50)

## 2015-12-03 MED ORDER — AMOXICILLIN-POT CLAVULANATE 875-125 MG PO TABS: 1.0000 | ORAL_TABLET | Freq: Two times a day (BID) | ORAL | Status: DC

## 2015-12-03 MED ORDER — FLUCONAZOLE 150 MG PO TABS: 150.0000 mg | ORAL_TABLET | Freq: Once | ORAL | Status: DC

## 2015-12-03 NOTE — Patient Instructions (Addendum)
treating for  Sinusitis  Although   Could be viral .   But the throat and headache   Consistent with this . Expect improvement in the next 3-5 days.   Check bp at home   To ensure better control  If not  In range need  Evaluation and  medication evaluation  Agree with stopping tobacco again .    Lab Results  Component Value Date   WBC 6.9 09/23/2015   HGB 13.3 09/23/2015   HCT 39.7 09/23/2015   PLT 369 09/23/2015   GLUCOSE 158* 09/23/2015   CHOL 186 10/23/2013   TRIG 132 10/23/2013   HDL 40 10/23/2013   LDLCALC 120* 10/23/2013   ALT 29 09/23/2015   AST 24 09/23/2015   NA 140 09/23/2015   K 4.0 09/23/2015   CL 102 09/23/2015   CREATININE 1.02 09/23/2015   BUN 14 09/23/2015   CO2 27 09/23/2015   TSH 8.51* 06/23/2015   INR 1.0 09/10/2008   HGBA1C 10.5* 06/23/2015   MICROALBUR 10.30* 02/08/2007   Will notify you  of labs when available. May do best weeing endocrinologist about next best step

## 2015-12-03 NOTE — Progress Notes (Signed)
Pre visit review using our clinic review tool, if applicable. No additional management support is needed unless otherwise documented below in the visit note.  Chief Complaint  Patient presents with  . Cough  . Headache  . Sore Throat  . Shortness of Breath  . Generalized Body Aches    HPI: Patient [name]  comes in today for SDA for  new problem evaluation. PCP NA  ocass cough  And than 1-2 weeks of sinus drainage   And last week  Body ache   Family also sick and now   over the last few days getting worse back of her throat hurts to swallow a mucous getting stuck. No vomiting or diarrhea feels like gagging and choking. No r wheezing Worse and bad not as bad to swallow. Last hiv rna and helper  Cells  12 16 nl Begun on metformin over 3 months ago for new onset diabetes and thyroid dose was increased by Dr. [name] however she hasn't been in for follow-up in Dr. Hale Bogus been out of the office. Okay to get follow-up labs today. She's on medication for hypertension has been taking them although not to take she was sick hasn't checked her readings recently this is very high for her. ROS: See pertinent positives and negatives per HPI. No blood in stool blood and mucus has some face pain and headache in the bifrontal area. No vomiting diarrhea She is stop smoking in the past but has been smoking recently a half a pack per day does want to stop again.  Past Medical History  Diagnosis Date  . Hyperthyroidism 08/2006    Grave's Disease -diffuse radiotracer uptake 08/25/06 Thyroid scan-Cold nodule to R lower lobe of thyrorid  . HIV DISEASE 07/24/2006    dx March 05  . Sarcoidosis ([place]) 02/08/2007    dx as a teenager in [place] from abnl CXR. Completed 2 yrs Prednisone after lung bx confirmation. No symptoms since then.  . THYROID NODULE, RIGHT 02/08/2007  . GRAVE'S DISEASE 01/01/2008  . 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 HYPERTENSION 07/24/2006  . ASTHMA 07/24/2006  . GERD  07/24/2006  . 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 Anemia     Normocytic  . History of hidradenitis suppurativa   . Gastroenteritis 07/10/07  . Menometrorrhagia     hx of  . History of thrush   . Bronchitis 2005  . Pneumonia 2005  . Nephrolithiasis     Family History  Problem Relation Age of Onset  . Cancer Other     FH of Breast Cancer  . Cancer Other     Fh of Lung Cancer  . Heart disease Other   . Hypertension Other   . Stroke Other     Grandparent  . Kidney disease Other     Grandparent  . Diabetes Other     FH of Diabetes  . Hypertension Mother   . Cancer Mother     throat  . Hypertension Father   . Hypertension Sister   . Cancer Maternal Uncle     Stomach Cancer    Social History   Social History  . Marital Status: Single    Spouse Name: N/A  . Number of Children: 1  . Years of Education: N/A   Occupational History  . [employer]   Social History Main Topics  . Smoking status: Current Some Day Smoker -- 1.00 packs/day for 15 years    Types: Cigarettes  . Smokeless tobacco: Never Used  Comment: will stop on 06/21/14  . Alcohol Use: No  . Drug Use: No  . Sexual Activity: Not Currently     Comment: declined condoms   Other Topics Concern  . None   Social History Narrative    Outpatient Prescriptions Prior to Visit  Medication Sig Dispense Refill  . acetaminophen (TYLENOL) 500 MG tablet Take 1,000 mg by mouth every 6 (six) hours as needed for mild pain.    Marland Kitchen amLODipine (NORVASC) 5 MG tablet TAKE 1 TABLET (5 MG TOTAL) BY MOUTH DAILY. 90 tablet 0  . emtricitabine-rilpivir-tenofovir AF (ODEFSEY) 200-25-25 MG TABS per tablet Take 1 tablet by mouth daily. 30 tablet 11  . fluticasone (FLONASE) 50 MCG/ACT nasal spray Place into both nostrils daily. Reported on 10/07/2015    . glucose blood (ONETOUCH VERIO) test strip USE TO CHECK BLOOD SUGAR DAILY AND PRN 100 each 4  . hydrochlorothiazide (MICROZIDE) 12.5 MG capsule TAKE ONE  CAPSULE BY MOUTH EVERY DAY 30 capsule 4  . levothyroxine (SYNTHROID, LEVOTHROID) 200 MCG tablet TAKE 1 TABLET (200 MCG TOTAL) BY MOUTH DAILY. 30 tablet 3  . metFORMIN (GLUCOPHAGE) 500 MG tablet TAKE 1 TABLET (500 MG TOTAL) BY MOUTH 2 (TWO) TIMES DAILY WITH A MEAL. 60 tablet 3  . Multiple Vitamin (MULTIVITAMIN) tablet Take 1 tablet by mouth daily.    Marland Kitchen NIFEdipine (PROCARDIA XL/ADALAT-CC) 60 MG 24 hr tablet TAKE 1 TABLET (60 MG TOTAL) BY MOUTH DAILY. 30 tablet 7  . ONE TOUCH LANCETS MISC USE TO CHECK BLOOD SUGAR DAILY AND PRN 100 each 4  . potassium chloride (K-DUR) 10 MEQ tablet Take 1 tablet (10 mEq total) by mouth daily. 30 tablet 1  . albuterol (PROVENTIL HFA;VENTOLIN HFA) 108 (90 BASE) MCG/ACT inhaler Inhale 2 puffs into the lungs every 6 (six) hours as needed for wheezing or shortness of breath. (Patient not taking: Reported on 12/03/2015) 1 Inhaler 2   No facility-administered medications prior to visit.     EXAM:  BP 168/96 mmHg  Pulse 77  Temp(Src) 97.4 F (36.3 C) (Oral)  Wt 243 lb 14.4 oz (110.632 kg)  SpO2 99%  Body mass index is 39.39 kg/(m^2). WDWN in NAD  quiet respirations; mildly congested  somewhat hoarse. Non toxic . But obviously doesn't feel well congested. HEENT: Normocephalic ;atraumatic , Eyes;  PERRL, EOMs  Full, lids and conjunctiva clear,,Ears: no deformities, canals nl, TM landmarks normal, left slightly flushed Nose: no deformity mild discharge but congested;face minimally tender over the frontal sinuses Mouth : OP clear without lesion or edema . Mild erythema no lesions. Neck: Supple without adenopathy or masses or bruits Chest:  Clear to A&P without wheezes rales or rhonchi CV:  S1-S2 no gallops or murmurs peripheral perfusion is normal Skin :nl perfusion and no acute rashes  MS: moves all extremities without noticeable focal  abnormality Neurologic is grossly normal ASSESSMENT AND PLAN:  Discussed the following assessment and plan:  Sinusitis,  unspecified chronicity, unspecified location  Essential hypertension - Plan: Basic metabolic panel, Hemoglobin A1c, TSH, CBC with Differential/Platelet  Hypothyroidism, unspecified hypothyroidism type - Dose was adjusted last time needs recheck TSH - Plan: Basic metabolic panel, Hemoglobin A1c, TSH, CBC with Differential/Platelet  Diabetes mellitus without complication ([place]) - Plan: Basic metabolic panel, Hemoglobin A1c, TSH, CBC with Differential/Platelet  Protracted upper respiratory infection  Sore throat  Tobacco use It is possible that she has an acute viral illness on top of her baseline sinus congestion however would treat as if she has a  worsening protracted respiratory infection that is complicated by sinusitis.  She will need to be reassigned to a new primary care doctor we discussed this today blood pressure needs to be controlled it's possibly up because of illness and missing just one day of medicine. If her A1c is still up quite a bit she may do best seeing an endocrinologist for medication control. Last A1c was very elevated in September 2016. 5 months ago. We'll get a CBC A1c TSH today -Patient advised to return or notify health care team  if symptoms worsen ,persist or new concerns arise.  Patient Instructions   treating for  Sinusitis  Although   Could be viral .   But the throat and headache   Consistent with this . Expect improvement in the next 3-5 days.   Check bp at home   To ensure better control  If not  In range need  Evaluation and  medication evaluation  Agree with stopping tobacco again .    Lab Results  Component Value Date   WBC 6.9 09/23/2015   HGB 13.3 09/23/2015   HCT 39.7 09/23/2015   PLT 369 09/23/2015   GLUCOSE 158* 09/23/2015   CHOL 186 10/23/2013   TRIG 132 10/23/2013   HDL 40 10/23/2013   LDLCALC 120* 10/23/2013   ALT 29 09/23/2015   AST 24 09/23/2015   NA 140 09/23/2015   K 4.0 09/23/2015   CL 102 09/23/2015   CREATININE 1.02  09/23/2015   BUN 14 09/23/2015   CO2 27 09/23/2015   TSH 8.51* 06/23/2015   INR 1.0 09/10/2008   HGBA1C 10.5* 06/23/2015   MICROALBUR 10.30* 02/08/2007   Will notify you  of labs when available. May do best weeing endocrinologist about next best step      Standley Brooking. Machele Deihl M.D.

## 2015-12-07 ENCOUNTER — Other Ambulatory Visit: Payer: Self-pay | Admitting: Family

## 2015-12-24 ENCOUNTER — Telehealth: Payer: Self-pay | Admitting: Family

## 2015-12-24 MED ORDER — BENZONATATE 200 MG PO CAPS: ORAL_CAPSULE | ORAL | Status: DC

## 2015-12-24 NOTE — Telephone Encounter (Signed)
Discussed pt with Dorothyann Peng NP, verbal order given for Tessalon capsules 200 mg, every 6 to 8 hours as needed.   Called pt, told her discussed with Tommi Rumps NP due to Dr. Regis Bill not here this afternoon. Told her will send Rx for Tessalon Capsules 200 mg, can take one every 6-8 hours as needed to help with cough. Told pt if does not improve in a few days needs to be seen. Pt verbalized understanding. Rx sent to pharmacy.

## 2015-12-24 NOTE — Telephone Encounter (Signed)
Patient Name: [name]  DOB: 1965-12-15    Initial Comment Caller states c/o coughing, back pain from coughing   Nurse Assessment  Nurse: Raphael Gibney, RN, Vanita Ingles Date/Time (Eastern Time): 12/24/2015 12:02:43 PM  Confirm and document reason for call. If symptomatic, describe symptoms. You must click the next button to save text entered. ---Caller states she has cough and she is congested. Having back pain from coughing. Had fever on Monday but not now. symptoms started on Monday.  Has the patient traveled out of the country within the last 30 days? ---No  Does the patient have any new or worsening symptoms? ---Yes  Will a triage be completed? ---Yes  Related visit to physician within the last 2 weeks? ---No  Does the PT have any chronic conditions? (i.e. diabetes, asthma, etc.) ---Yes  List chronic conditions. ---diabetes  Is the patient pregnant or possibly pregnant? (Ask all females between the ages of 68-55) ---No  Is this a behavioral health or substance abuse call? ---No     Guidelines    Guideline Title Affirmed Question Affirmed Notes  Cough - Acute Non-Productive SEVERE coughing spells (e.g., whooping sound after coughing, vomiting after coughing)    Final Disposition User   See Physician within 24 Hours Stringer, RN, Vera    Comments  Caller states she does not want to make appt and be seen. She is wanting cough medication called in rather than coming into the office She has used OTC medication and it is not helping. Please call pt back and let her know about the cough medication.   Referrals  GO TO FACILITY REFUSED   Disagree/Comply: Disagree  Disagree/Comply Reason: Disagree with instructions

## 2015-12-28 ENCOUNTER — Other Ambulatory Visit: Payer: Self-pay | Admitting: Family

## 2016-01-12 ENCOUNTER — Other Ambulatory Visit: Payer: Self-pay | Admitting: Family

## 2016-01-25 ENCOUNTER — Other Ambulatory Visit: Payer: Self-pay | Admitting: Internal Medicine

## 2016-02-04 ENCOUNTER — Encounter (HOSPITAL_BASED_OUTPATIENT_CLINIC_OR_DEPARTMENT_OTHER): Payer: Self-pay | Admitting: *Deleted

## 2016-02-04 ENCOUNTER — Emergency Department (HOSPITAL_BASED_OUTPATIENT_CLINIC_OR_DEPARTMENT_OTHER)
Admission: EM | Admit: 2016-02-04 | Discharge: 2016-02-04 | Disposition: A | Discharge status: Home / Self Care | Payer: 59 | Attending: Emergency Medicine | Admitting: Emergency Medicine

## 2016-02-04 ENCOUNTER — Emergency Department (HOSPITAL_BASED_OUTPATIENT_CLINIC_OR_DEPARTMENT_OTHER): Payer: 59

## 2016-02-04 DIAGNOSIS — E890 Postprocedural hypothyroidism: Secondary | ICD-10-CM | POA: Insufficient documentation

## 2016-02-04 DIAGNOSIS — Z8742 Personal history of other diseases of the female genital tract: Secondary | ICD-10-CM | POA: Diagnosis not present

## 2016-02-04 DIAGNOSIS — M546 Pain in thoracic spine: Secondary | ICD-10-CM | POA: Insufficient documentation

## 2016-02-04 DIAGNOSIS — Z79899 Other long term (current) drug therapy: Secondary | ICD-10-CM | POA: Diagnosis not present

## 2016-02-04 DIAGNOSIS — J45909 Unspecified asthma, uncomplicated: Secondary | ICD-10-CM | POA: Insufficient documentation

## 2016-02-04 DIAGNOSIS — Z8701 Personal history of pneumonia (recurrent): Secondary | ICD-10-CM | POA: Insufficient documentation

## 2016-02-04 DIAGNOSIS — Z862 Personal history of diseases of the blood and blood-forming organs and certain disorders involving the immune mechanism: Secondary | ICD-10-CM | POA: Insufficient documentation

## 2016-02-04 DIAGNOSIS — Z87448 Personal history of other diseases of urinary system: Secondary | ICD-10-CM | POA: Diagnosis not present

## 2016-02-04 DIAGNOSIS — M25512 Pain in left shoulder: Secondary | ICD-10-CM | POA: Insufficient documentation

## 2016-02-04 DIAGNOSIS — I10 Essential (primary) hypertension: Secondary | ICD-10-CM | POA: Insufficient documentation

## 2016-02-04 DIAGNOSIS — Z8719 Personal history of other diseases of the digestive system: Secondary | ICD-10-CM | POA: Insufficient documentation

## 2016-02-04 DIAGNOSIS — E0590 Thyrotoxicosis, unspecified without thyrotoxic crisis or storm: Secondary | ICD-10-CM | POA: Diagnosis not present

## 2016-02-04 DIAGNOSIS — E0500 Thyrotoxicosis with diffuse goiter without thyrotoxic crisis or storm: Secondary | ICD-10-CM | POA: Insufficient documentation

## 2016-02-04 DIAGNOSIS — F17210 Nicotine dependence, cigarettes, uncomplicated: Secondary | ICD-10-CM | POA: Diagnosis not present

## 2016-02-04 DIAGNOSIS — Z872 Personal history of diseases of the skin and subcutaneous tissue: Secondary | ICD-10-CM | POA: Diagnosis not present

## 2016-02-04 DIAGNOSIS — B20 Human immunodeficiency virus [HIV] disease: Secondary | ICD-10-CM | POA: Diagnosis not present

## 2016-02-04 DIAGNOSIS — M62830 Muscle spasm of back: Secondary | ICD-10-CM | POA: Diagnosis not present

## 2016-02-04 DIAGNOSIS — M549 Dorsalgia, unspecified: Secondary | ICD-10-CM | POA: Diagnosis present

## 2016-02-04 MED ORDER — IBUPROFEN 800 MG PO TABS
800.0000 mg | ORAL_TABLET | Freq: Once | ORAL | Status: AC
  Administered 2016-02-04: 800 mg via ORAL
  Filled 2016-02-04: qty 1

## 2016-02-04 MED ORDER — TRAMADOL HCL 50 MG PO TABS: 50.0000 mg | ORAL_TABLET | Freq: Four times a day (QID) | ORAL | Status: DC | PRN

## 2016-02-04 MED ORDER — CYCLOBENZAPRINE HCL 10 MG PO TABS: 10.0000 mg | ORAL_TABLET | Freq: Two times a day (BID) | ORAL | Status: DC | PRN

## 2016-02-04 MED ORDER — NAPROXEN 500 MG PO TABS: 500.0000 mg | ORAL_TABLET | Freq: Two times a day (BID) | ORAL | Status: DC

## 2016-02-04 NOTE — ED Notes (Signed)
Left scapula pain for a month.

## 2016-02-04 NOTE — ED Provider Notes (Signed)
CSN: [id number]     Arrival date & time 02/04/16  1831 History   First MD Initiated Contact with Patient 02/04/16 1940     Chief Complaint  Patient presents with  . Back Pain     (Consider location/radiation/quality/duration/timing/severity/associated sxs/prior Treatment) HPI  This is a 50 year old female with a past medical history of HIV, sarcoidosis, Graves' disease, hypertension. Patient states that she has pain in the left scapular region. Patient states that she doesn't know how it happened, but she woke up this morning with severe pain. Hurts to sit up straight or move her shoulder blade. She states that the same thing occurred about 3 weeks ago on the right side. Today it is on the left. She denies numbness or tingling. She has a history of breast reduction surgery. She has a chronic cough from her sarcoidosis but denies any sputum production, asthma, wheezing. Past Medical History  Diagnosis Date  . Hyperthyroidism 08/2006    Grave's Disease -diffuse radiotracer uptake 08/25/06 Thyroid scan-Cold nodule to R lower lobe of thyrorid  . HIV DISEASE 07/24/2006    dx March 05  . Sarcoidosis ([place]) 02/08/2007    dx as a teenager in [place] from abnl CXR. Completed 2 yrs Prednisone after lung bx confirmation. No symptoms since then.  . THYROID NODULE, RIGHT 02/08/2007  . GRAVE'S DISEASE 01/01/2008  . 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 HYPERTENSION 07/24/2006  . ASTHMA 07/24/2006  . GERD 07/24/2006  . 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 Anemia     Normocytic  . History of hidradenitis suppurativa   . Gastroenteritis 07/10/07  . Menometrorrhagia     hx of  . History of thrush   . Bronchitis 2005  . Pneumonia 2005  . Nephrolithiasis    Past Surgical History  Procedure Laterality Date  . Total thyroidectomy  2010  . [place]  1994  . Dilation and curettage of uterus  Feb 2004    s/p for 1st trimester nonviable pregnancy  . Incision and drainage peritonsillar abscess   Mar 03  . Incise and drain abcess  Nov 03    s/p I &D for righ inframmary fold hidradenitis  . Irrigation and debridement abscess  01/31/2012    Procedure: IRRIGATION AND DEBRIDEMENT ABSCESS;  Surgeon: Shann Medal, MD;  Location: [facility];  Service: General;  Laterality: Right;  right breast and axilla   . Eye surgery      sty under eyelid  . Nephrolithotomy  10/02/2012    Procedure: NEPHROLITHOTOMY PERCUTANEOUS;  Surgeon: Alexis Frock, MD;  Location: [facility];  Service: Urology;  Laterality: Right;  First Stage Percutaneous Nephrolithotomy with Surgeon Access, Left Ureteral Stent    . Cystoscopy with stent placement  10/02/2012    Procedure: CYSTOSCOPY WITH STENT PLACEMENT;  Surgeon: Alexis Frock, MD;  Location: [facility];  Service: Urology;  Laterality: Left;  . Nephrolithotomy  10/04/2012    Procedure: NEPHROLITHOTOMY PERCUTANEOUS SECOND LOOK;  Surgeon: Alexis Frock, MD;  Location: [facility];  Service: Urology;  Laterality: Right;     . Nephrolithotomy  10/08/2012    Procedure: NEPHROLITHOTOMY PERCUTANEOUS;  Surgeon: Alexis Frock, MD;  Location: [facility];  Service: Urology;  Laterality: Right;  THIRD STAGE, nephrostomy tube exchange x 2  . Nephrolithotomy  10/11/2012    Procedure: NEPHROLITHOTOMY PERCUTANEOUS SECOND LOOK;  Surgeon: Alexis Frock, MD;  Location: [facility];  Service: Urology;  Laterality: Right;  RIGHT 4 STAGE PERCUTANOUS NEPHROLITHOTOMY, right URETEROSCOPY WITH HOLMIUM LASER   . Cystoscopy with retrograde pyelogram,  ureteroscopy and stent placement  11/09/2012    Procedure: [place], URETEROSCOPY AND STENT PLACEMENT;  Surgeon: Alexis Frock, MD;  Location: [facility];  Service: Urology;  Laterality: Left;  LEFT URETEROSCOPY, STONE MANIPULATION, left STENT exchange   . Cystoscopy w/ ureteral stent removal  11/09/2012    Procedure: CYSTOSCOPY WITH STENT REMOVAL;  Surgeon: Alexis Frock, MD;  Location: [facility];  Service: Urology;  Laterality: Right;  .  Breast surgery  1997    Breast Reduction    Family History  Problem Relation Age of Onset  . Cancer Other     FH of Breast Cancer  . Cancer Other     Fh of Lung Cancer  . Heart disease Other   . Hypertension Other   . Stroke Other     Grandparent  . Kidney disease Other     Grandparent  . Diabetes Other     FH of Diabetes  . Hypertension Mother   . Cancer Mother     throat  . Hypertension Father   . Hypertension Sister   . Cancer Maternal Uncle     Stomach Cancer   Social History  Substance Use Topics  . Smoking status: Current Some Day Smoker -- 1.00 packs/day for 15 years    Types: Cigarettes  . Smokeless tobacco: Never Used     Comment: will stop on 06/21/14  . Alcohol Use: No   OB History    No data available     Review of Systems  Ten systems reviewed and are negative for acute change, except as noted in the HPI.    Allergies  Tape; Genvoya; Lisinopril; and Xylocaine  Home Medications   Prior to Admission medications   Medication Sig Start Date End Date Taking? Authorizing Provider  acetaminophen (TYLENOL) 500 MG tablet Take 1,000 mg by mouth every 6 (six) hours as needed for mild pain.    Historical Provider, MD  albuterol (PROVENTIL HFA;VENTOLIN HFA) 108 (90 BASE) MCG/ACT inhaler Inhale 2 puffs into the lungs every 6 (six) hours as needed for wheezing or shortness of breath. Patient not taking: Reported on 12/03/2015 10/29/14   Kennyth Arnold, FNP  amLODipine (NORVASC) 5 MG tablet TAKE 1 TABLET (5 MG TOTAL) BY MOUTH DAILY. 12/28/15   Kennyth Arnold, FNP  amoxicillin-clavulanate (AUGMENTIN) 875-125 MG tablet Take 1 tablet by mouth every 12 (twelve) hours. 12/03/15   Burnis Medin, MD  benzonatate (TESSALON) 200 MG capsule TAKE ONE CAPSULE BY MOUTH EVERY 6-8 HOURS AS NEEDED FOR COUGH. 12/24/15   Dorothyann Peng, NP  emtricitabine-rilpivir-tenofovir AF (ODEFSEY) 200-25-25 MG TABS per tablet Take 1 tablet by mouth daily. 06/09/15   Michel Bickers, MD  fluconazole  (DIFLUCAN) 150 MG tablet Take 1 tablet (150 mg total) by mouth once. May repeat in 3 days if needed. 12/03/15   Burnis Medin, MD  fluticasone (FLONASE) 50 MCG/ACT nasal spray Place into both nostrils daily. Reported on 10/07/2015    Historical Provider, MD  glucose blood (ONETOUCH VERIO) test strip USE TO CHECK BLOOD SUGAR DAILY AND PRN 06/25/15   Kennyth Arnold, FNP  hydrochlorothiazide (MICROZIDE) 12.5 MG capsule TAKE ONE CAPSULE BY MOUTH EVERY DAY 08/31/15   Michel Bickers, MD  levothyroxine (SYNTHROID, LEVOTHROID) 200 MCG tablet TAKE 1 TABLET (200 MCG TOTAL) BY MOUTH DAILY. 01/12/16   Kennyth Arnold, FNP  metFORMIN (GLUCOPHAGE) 500 MG tablet TAKE 1 TABLET (500 MG TOTAL) BY MOUTH 2 (TWO) TIMES DAILY WITH A MEAL. 11/05/15  Kennyth Arnold, FNP  Multiple Vitamin (MULTIVITAMIN) tablet Take 1 tablet by mouth daily.    Historical Provider, MD  NIFEdipine (PROCARDIA XL/ADALAT-CC) 60 MG 24 hr tablet TAKE 1 TABLET (60 MG TOTAL) BY MOUTH DAILY.    Michel Bickers, MD  ONE TOUCH LANCETS MISC USE TO CHECK BLOOD SUGAR DAILY AND PRN 06/25/15   Kennyth Arnold, FNP  potassium chloride (K-DUR) 10 MEQ tablet Take 1 tablet (10 mEq total) by mouth daily. 11/03/14   Kennyth Arnold, FNP   BP 156/94 mmHg  Pulse 82  Temp(Src) 98.3 F (36.8 C) (Oral)  Resp 16  Ht 5\' 5"  (1.651 m)  Wt 110.224 kg  BMI 40.44 kg/m2  SpO2 96% Physical Exam  Constitutional: She is oriented to person, place, and time. She appears well-developed and well-nourished. No distress.  HENT:  Head: Normocephalic and atraumatic.  Eyes: Conjunctivae are normal. No scleral icterus.  Neck: Normal range of motion.  Cardiovascular: Normal rate, regular rhythm and normal heart sounds.  Exam reveals no gallop and no friction rub.   No murmur heard. Pulmonary/Chest: Effort normal and breath sounds normal. No respiratory distress. She has no wheezes. She has no rales. She exhibits no tenderness.  Abdominal: Soft. Bowel sounds are normal. She exhibits no  distension and no mass. There is no tenderness. There is no guarding.  Musculoskeletal:  Pain at the lower aspect of the left trapezius. TTP +spastic tissue  Neurological: She is alert and oriented to person, place, and time.  Normal strength and sensation in upper extremities.   Skin: Skin is warm and dry. She is not diaphoretic.  Psychiatric:  Angry   Nursing note and vitals reviewed.   ED Course  Procedures (including critical care time) Labs Review Labs Reviewed - No data to display  Imaging Review No results found. I have personally reviewed and evaluated these images and lab results as part of my medical decision-making.   EKG Interpretation None      MDM   Final diagnoses:  Trigger point of thoracic region  Muscle spasm of back    8:14 PM BP 156/94 mmHg  Pulse 82  Temp(Src) 98.3 F (36.8 C) (Oral)  Resp 16  Ht 5\' 5"  (1.651 m)  Wt 110.224 kg  BMI 40.44 kg/m2  SpO2 96% Patient with obvious muscular spasm. She demands a cxr.  9:20 PM Patient with negative cxr. Will discharge with naproxen, tramadol, and flexeril. Recommend follow up with pcp and physical therapy. Exam is consistent with active trigger point in the lower trap fibers.    Margarita Mail, PA-C 02/04/16 2122  Fredia Sorrow, MD 02/04/16 [phone]

## 2016-02-04 NOTE — Discharge Instructions (Signed)
Your chest xray was negative. Please read the information below regarding your diagnosis. Follow up with your primary care doctor, a massage therapist or a physical therapist .   Trigger Points Anatomy Your muscles contract and relax whenever you move.  If a muscle does not relax completely, a very tight band of muscle fibers can spasm and form  a trigger point. Back to Top  Causes Traumatic injury, overexertion, muscle tension ,over use, poor posture, and muscle spasms, are common causes of trigger points. Back to Top  Symptoms A trigger point can cause extreme pain. It may feel tender, hard, or twitch when you touch it. In some cases, a trigger point can irritate surrounding nerves causing pain to spread to nearby areas (referred pain).  Your pain may limit your ability to move comfortably. Back to Top  Diagnosis You should tell your doctor about your symptoms and any activities, injury, or conditions that you suspect may have contributed to your problem.  Your doctor will examine you by carefully feeling the muscle area.  The muscle may be gently pressed to identify  areas of referred pain.  Back to Top  Treatment Therapeutic modalities, such as ultrasound and manipulative therapy, can be beneficial for some people.  However, trigger point injections are considered the most effective way to inactivate a trigger point for prompt symptom relief.  The injected medication usually consists of an anesthetic to relax the area and relieve pain.  A steroid medication is sometimes included in the injection.  Trigger point injections can be administered at a doctors office.  The treatment time is short, usually lasting several minutes. You may receive a nerve block or local anesthetic prior to your trigger point injection to prevent pain.  To deliver the trigger point injection, your doctor will insert a small needle into the trigger point and inject the medication.   You  will be provided with  instructions to reduce initial pain and swelling at the treatment site.  Trigger point injections are usually followed by physical therapy  aimed at pain relief and muscle stretching.  In some cases, trigger point injections may be repeated.  You have neck pain, possibly from a cervical strain and/or pinched nerve.  SEEK IMMEDIATE MEDICAL ATTENTION IF: You develop difficulties swallowing or breathing.  You have new or worse numbness, weakness, tingling, or movement problems in your arms or legs.  You develop increasing pain which is uncontrolled with medications.  You have change in bowel or bladder function, or other concerns.  SEEK IMMEDIATE MEDICAL ATTENTION IF: New numbness, tingling, weakness, or problem with the use of your arms or legs.  Severe back pain not relieved with medications.  Change in bowel or bladder control.  Increasing pain in any areas of the body (such as chest or abdominal pain).  Shortness of breath, dizziness or fainting.  Nausea (feeling sick to your stomach), vomiting, fever, or sweats.

## 2016-02-18 ENCOUNTER — Ambulatory Visit (INDEPENDENT_AMBULATORY_CARE_PROVIDER_SITE_OTHER): Payer: Managed Care, Other (non HMO) | Admitting: Family Medicine

## 2016-02-18 ENCOUNTER — Encounter: Payer: Self-pay | Admitting: Family Medicine

## 2016-02-18 VITALS — BP 146/98 | HR 93 | Temp 98.2°F | Resp 16 | Ht 65.0 in | Wt 247.8 lb

## 2016-02-18 DIAGNOSIS — M25511 Pain in right shoulder: Secondary | ICD-10-CM | POA: Diagnosis not present

## 2016-02-18 DIAGNOSIS — N611 Abscess of the breast and nipple: Secondary | ICD-10-CM | POA: Diagnosis not present

## 2016-02-18 DIAGNOSIS — J309 Allergic rhinitis, unspecified: Secondary | ICD-10-CM

## 2016-02-18 DIAGNOSIS — I10 Essential (primary) hypertension: Secondary | ICD-10-CM | POA: Diagnosis not present

## 2016-02-18 MED ORDER — LOSARTAN POTASSIUM 50 MG PO TABS: 50.0000 mg | ORAL_TABLET | Freq: Every day | ORAL | Status: DC

## 2016-02-18 MED ORDER — MOMETASONE FUROATE 50 MCG/ACT NA SUSP
2.0000 | Freq: Every day | NASAL | Status: DC
Start: 2016-02-18 — End: 2017-06-23

## 2016-02-18 MED ORDER — DOXYCYCLINE HYCLATE 100 MG PO TABS: 100.0000 mg | ORAL_TABLET | Freq: Two times a day (BID) | ORAL | Status: AC

## 2016-02-18 NOTE — Patient Instructions (Signed)
A few things to remember from today's visit:   1. Allergic rhinitis, unspecified allergic rhinitis type Steam inhalation, nasal saline, Allegra (plain), and Nasonex.   2. Essential hypertension Losartan added. No changes in rest. No decongestants. Monitor blood pressure at home.   - losartan (COZAAR) 50 MG tablet; Take 1 tablet (50 mg total) by mouth daily.  Dispense: 30 tablet; Refill: 1  3. Abscess of breast, right Antibiotic. If not greatly better in 2-3 days surgeon is going to be needed.  - doxycycline (VIBRA-TABS) 100 MG tablet; Take 1 tablet (100 mg total) by mouth 2 (two) times daily.  Dispense: 28 tablet; Refill: 0  4. Right shoulder pain Chiropractor. Exercises, range of motion  - mometasone (NASONEX) 50 MCG/ACT nasal spray; Place 2 sprays into the nose daily.  Dispense: 17 g; Refill: 3     Smoking cessation strongly recommended.

## 2016-02-18 NOTE — Progress Notes (Signed)
Subjective:    Patient ID: [name], female    DOB: 12-May-1966, 50 y.o.   MRN: [id number]   Ms.  [name] is a 50 y.o.female here today c/o a day of right breast pain and erythematous rash. According to patient this is the third time in the past year she has had same symptoms, she has been diagnosed with cellulitis and recurrent abscess around the same area and treated with oral antibiotics.  Last time she remembers eating treated for same condition is February 2017, when she took Augmentin. She has some not healed lesions on right axilla and under right breat, the latter one has drained some recently. These lesions have been present for over 4-5 months.  She also has a history of hidradenitis suppurativa and HIV. She is following with Dr [name], [place], seen last 11/12/15, currently she is on [place] and according to pt, disease is well controlled.  She has not noted any right  breast mass or nipple discharge. She is reporting a mammogram done within the past 6 months and according to patient it was negative. She has not had any fever, chills, associated arthralgias, or oral lesions. She has applied warm compresses, she has not taking any over-the-counter medication. FHx + breast cancer, 2 maternal aunts. Neg for ovarian cancer. She is not on hormone therapy. LMP a year ago.  11/18/2014 mammogram [facility].   Shoulder pain: She is also complaining of 2 weeks of right shoulder pain, she denies any prior history, no recent trauma, no neck pain. She states that about 2 weeks ago she was seen for left upper muscle spasms, she was given the Flexeril, which she has taken to treat right shoulder pain.   Rash This is a recurrent problem. The current episode started yesterday. The problem has been gradually worsening since onset. The rash is characterized by redness and pain. She was exposed to nothing. Associated symptoms include congestion, coughing (occasionally) and rhinorrhea. Pertinent  negatives include no facial edema, fatigue, fever, joint pain, shortness of breath, sore throat or vomiting. (Other symptoms addressed today are not associated with her rash.) Past treatments include nothing. Her past medical history is significant for allergies and asthma.  Shoulder Pain  The pain is present in the right shoulder. This is a new problem. The current episode started 1 to 4 weeks ago. There has been no history of extremity trauma. The problem occurs intermittently. The problem has been unchanged. The quality of the pain is described as aching. The pain is mild. Pertinent negatives include no fever, inability to bear weight, joint locking, joint swelling, limited range of motion or numbness. The symptoms are aggravated by activity. She has tried rest for the symptoms. The treatment provided no relief. Her past medical history is significant for diabetes.    She is also complaining of "sinus" congestion. 3 days of worsening nasal congestion, rhinorrhea, postnasal drainage, epiphora,sneezing, pruritic eyes and nose. No associated myalgias or fatigue. Occasional cough, nonproductive. She has not noted wheezing or chest pain associated. No Hx of recent travel. No sick contact. No known insect bite. + Hx of allergies, has tried OTC nasal sprays and Flonase.  She as tried OTC Sudafed and Mucinex. Symptoms otherwise stable.  Hx of DM II, BS's reported as "good." Listed on her problems sarcoidosis, she states that it is not longer active, has been asymptomatic for years.   Hypertension: Her blood pressure is elevated today, she states that her blood pressure is "always  high."   Currently she is on amlodipine 5 mg and HCTZ 12.5 mg daily. She is taking medications as instructed, no side effects reported.  She has not noted unusual headache, visual changes, exertional chest pain, dyspnea,  focal weakness, or edema.    Lab Results  Component Value Date   CREATININE 0.96 12/03/2015     BUN 16 12/03/2015   NA 140 12/03/2015   K 3.7 12/03/2015   CL 102 12/03/2015   CO2 31 12/03/2015      Review of Systems  Constitutional: Negative for fever, chills, activity change, appetite change and fatigue.  HENT: Positive for congestion, postnasal drip, rhinorrhea and sneezing. Negative for mouth sores, sinus pressure, sore throat, trouble swallowing and voice change.   Eyes: Positive for discharge and itching. Negative for photophobia, redness and visual disturbance.  Respiratory: Positive for cough (occasionally). Negative for shortness of breath.   Cardiovascular: Negative for chest pain and palpitations.  Gastrointestinal: Negative for nausea, vomiting and abdominal pain.  Genitourinary: Negative for dysuria, hematuria and decreased urine volume.  Musculoskeletal: Positive for arthralgias. Negative for back pain, joint pain, joint swelling and neck pain.  Skin: Positive for rash and wound.  Allergic/Immunologic: Positive for environmental allergies.  Neurological: Negative for weakness, numbness and headaches.  Hematological: Negative for adenopathy.  Psychiatric/Behavioral: Negative for confusion. The patient is not nervous/anxious.      Current Outpatient Prescriptions on File Prior to Visit  Medication Sig Dispense Refill  . acetaminophen (TYLENOL) 500 MG tablet Take 1,000 mg by mouth every 6 (six) hours as needed for mild pain.    Marland Kitchen albuterol (PROVENTIL HFA;VENTOLIN HFA) 108 (90 BASE) MCG/ACT inhaler Inhale 2 puffs into the lungs every 6 (six) hours as needed for wheezing or shortness of breath. 1 Inhaler 2  . amLODipine (NORVASC) 5 MG tablet TAKE 1 TABLET (5 MG TOTAL) BY MOUTH DAILY. 90 tablet 0  . cyclobenzaprine (FLEXERIL) 10 MG tablet Take 1 tablet (10 mg total) by mouth 2 (two) times daily as needed for muscle spasms. 20 tablet 0  . emtricitabine-rilpivir-tenofovir AF (ODEFSEY) 200-25-25 MG TABS per tablet Take 1 tablet by mouth daily. 30 tablet 11  . glucose  blood (ONETOUCH VERIO) test strip USE TO CHECK BLOOD SUGAR DAILY AND PRN 100 each 4  . hydrochlorothiazide (MICROZIDE) 12.5 MG capsule TAKE ONE CAPSULE BY MOUTH EVERY DAY 30 capsule 4  . levothyroxine (SYNTHROID, LEVOTHROID) 200 MCG tablet TAKE 1 TABLET (200 MCG TOTAL) BY MOUTH DAILY. 30 tablet 1  . metFORMIN (GLUCOPHAGE) 500 MG tablet TAKE 1 TABLET (500 MG TOTAL) BY MOUTH 2 (TWO) TIMES DAILY WITH A MEAL. 60 tablet 3  . Multiple Vitamin (MULTIVITAMIN) tablet Take 1 tablet by mouth daily.    . ONE TOUCH LANCETS MISC USE TO CHECK BLOOD SUGAR DAILY AND PRN 100 each 4  . fluconazole (DIFLUCAN) 150 MG tablet Take 1 tablet (150 mg total) by mouth once. May repeat in 3 days if needed. (Patient not taking: Reported on 02/18/2016) 1 tablet 1   No current facility-administered medications on file prior to visit.     Past Medical History  Diagnosis Date  . Hyperthyroidism 08/2006    Grave's Disease -diffuse radiotracer uptake 08/25/06 Thyroid scan-Cold nodule to R lower lobe of thyrorid  . HIV DISEASE 07/24/2006    dx March 05  . Sarcoidosis ([place]) 02/08/2007    dx as a teenager in [place] from abnl CXR. Completed 2 yrs Prednisone after lung bx confirmation. No symptoms since then.  Marland Kitchen  THYROID NODULE, RIGHT 02/08/2007  . GRAVE'S DISEASE 01/01/2008  . 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 HYPERTENSION 07/24/2006  . ASTHMA 07/24/2006  . GERD 07/24/2006  . 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 Anemia     Normocytic  . History of hidradenitis suppurativa   . Gastroenteritis 07/10/07  . Menometrorrhagia     hx of  . History of thrush   . Bronchitis 2005  . Pneumonia 2005  . Nephrolithiasis     Social History   Social History  . Marital Status: Single    Spouse Name: N/A  . Number of Children: 1  . Years of Education: N/A   Occupational History  . [place]   Social History Main Topics  . Smoking status: Current Some Day Smoker -- 1.00 packs/day for 15 years      Types: Cigarettes  . Smokeless tobacco: Never Used     Comment: will stop on 06/21/14  . Alcohol Use: No  . Drug Use: No  . Sexual Activity: Not Currently     Comment: declined condoms   Other Topics Concern  . None   Social History Narrative    Filed Vitals:   02/18/16 0825  BP: 146/98  Pulse: 93  Temp: 98.2 F (36.8 C)  Resp: 16   Body mass index is 41.24 kg/(m^2).  SpO2 Readings from Last 3 Encounters:  02/18/16 96%  02/04/16 98%  12/03/15 99%       Objective:   Physical Exam  Constitutional: She is oriented to person, place, and time. She appears well-developed. She does not appear ill. No distress.  HENT:  Head: Atraumatic.  Right Ear: External ear and ear canal normal. Tympanic membrane is not erythematous. A middle ear effusion is present.  Left Ear: External ear and ear canal normal. Tympanic membrane is not erythematous. A middle ear effusion is present.  Nose: Rhinorrhea present. No mucosal edema. Right sinus exhibits no maxillary sinus tenderness and no frontal sinus tenderness. Left sinus exhibits no maxillary sinus tenderness and no frontal sinus tenderness.  Mouth/Throat: Uvula is midline, oropharynx is clear and moist and mucous membranes are normal. No oropharyngeal exudate, posterior oropharyngeal edema or posterior oropharyngeal erythema.  Hypertrophic turbinates, nasal voice, mouth breathing.  Eyes: Conjunctivae and EOM are normal. Pupils are equal, round, and reactive to light.  Cardiovascular: Normal rate and regular rhythm.   No murmur heard. Pulses:      Dorsalis pedis pulses are 2+ on the right side, and 2+ on the left side.  Pulmonary/Chest: Effort normal and breath sounds normal. No respiratory distress. She has no wheezes. She has no rales.  Abdominal: Soft. She exhibits no mass. There is no tenderness.  Musculoskeletal: She exhibits no edema.       Cervical back: She exhibits normal range of motion and no tenderness.  Shoulder: No  deformity, edema, or erythema appreciated.No muscle atrophy. Luan Pulling' test neg, drop arm rotator cuff test neg, empty can supraspinatus test mild pain elicited, cross body adduction test neg. Lift-Off subscapularis test neg. ROM: active mildly limited, passive normal.  Lymphadenopathy:    She has no cervical adenopathy.    She has no axillary adenopathy.  I do not appreciate axillary lymphadenopathies, skin changes make it difficult.  Neurological: She is alert and oriented to person, place, and time. Coordination normal.  Skin: Skin is warm. Rash noted.  Right breast with periareolar erythema and local heat, involving nipple, tender, no masses. Multiple scarring changes related to prior skin lesions,  skin thickness and hyperpigmentation changes. Lower inner quadrant granulation changes, wound with no active draining, indurated, covered with band aid.   Psychiatric: She has a normal mood and affect.  Well groomed, good eye contact.       Assessment & Plan:    Baillie was seen today for sinusitis, shoulder pain and breast problem.  Diagnoses and all orders for this visit:  Abscess of breast, right  Recurrent, antibiotic treatment and recommended.  Peri-areolar lesion is not ready for I&D, because lesion is involving the nipple I would prefer to refer to surgery if it is not better in 2-3 days.  She has 2 other lesions, which seem to be chronic but not healed, we see how she does with antibiotic, she may need longer course of treatment, we may need to consider surgery referral if these wounds do not heal. She is already following with ID. Warm compresses, wt loss might also help. Risk factors:HIV and DM II, both adequately controlled.   -     doxycycline (VIBRA-TABS) 100 MG tablet; Take 1 tablet (100 mg total) by mouth 2 (two) times daily.   Allergic rhinitis, unspecified allergic rhinitis type History provided today suggests allergy symptoms, I don't think she has had bacterial  sinus infection. Recommended nasal saline drops and steam inhalations. Amlodipine my aggravate nasal congestion, no changes for now. Over-the-counter antihistaminic might also help. Follow-up in 2 months, before if needed.  -     mometasone (NASONEX) 50 MCG/ACT nasal spray; Place 2 sprays into the nose daily.   Essential hypertension  Not well controlled. Re-checked 145/95. Possible complications of elevated BP discussed. Losartan 50 mg added no changes on amlodipine. Some side effects of these 2 medications were discussed, BMP to be done in 7-10 days. Monitor blood pressure at home. Follow-up in 2 months with primary care physician.  -     losartan (COZAAR) 50 MG tablet; Take 1 tablet (50 mg total) by mouth daily. -     Basic Metabolic Panel; Future   Right shoulder pain  Possible causes discussed, it is not clear to me today that this is caused by rotator cuff tendinitis.  We discussed possible treatment options, she would like to try chiropractor's office close to her job.  Range of motion exercises recommended.  I don't think imaging is needed today but might be necessary if pain is not better. Follow-up as needed.     -Patient advised to return or notify a doctor immediately if symptoms worsen or persist or new concerns arise.    Betty G. Martinique, MD  [facility]. [place] office.

## 2016-03-07 ENCOUNTER — Other Ambulatory Visit: Payer: Self-pay

## 2016-03-20 ENCOUNTER — Other Ambulatory Visit: Payer: Self-pay | Admitting: Internal Medicine

## 2016-03-21 ENCOUNTER — Other Ambulatory Visit: Payer: Self-pay | Admitting: Family

## 2016-03-21 MED ORDER — HYDROCHLOROTHIAZIDE 12.5 MG PO CAPS: 12.5000 mg | ORAL_CAPSULE | Freq: Every day | ORAL | Status: DC

## 2016-03-21 NOTE — Telephone Encounter (Signed)
I saw [name] for an acute visit, added HCTZ to her HTN regimen because BP still elevated. It was recommended to follow with her PCP 2 months after visit.  A 30 days supply can be authorized but she needs to follow as recommended. Thanks, BJ

## 2016-03-21 NOTE — Telephone Encounter (Signed)
Pt saw dr Martinique on 02-18-16 and needs a refill on hctz #30 w/refills  [address]

## 2016-03-21 NOTE — Telephone Encounter (Signed)
Patient now has a PCP who is addressing the patient's blood pressure.

## 2016-03-29 ENCOUNTER — Other Ambulatory Visit: Payer: Self-pay | Admitting: *Deleted

## 2016-03-29 MED ORDER — LEVOTHYROXINE SODIUM 200 MCG PO TABS: ORAL_TABLET | ORAL | Status: DC

## 2016-04-23 ENCOUNTER — Other Ambulatory Visit: Payer: Self-pay | Admitting: Family

## 2016-04-25 NOTE — Telephone Encounter (Signed)
Rx refill sent to pharmacy. 

## 2016-06-27 ENCOUNTER — Other Ambulatory Visit: Payer: Self-pay | Admitting: Internal Medicine

## 2016-06-27 DIAGNOSIS — B20 Human immunodeficiency virus [HIV] disease: Secondary | ICD-10-CM

## 2016-07-24 ENCOUNTER — Other Ambulatory Visit: Payer: Self-pay | Admitting: Internal Medicine

## 2016-07-26 NOTE — Telephone Encounter (Signed)
Denied.  Dr. Mamie Nick is not the primary care physician.

## 2016-08-13 ENCOUNTER — Other Ambulatory Visit: Payer: Self-pay | Admitting: Internal Medicine

## 2016-08-16 NOTE — Telephone Encounter (Signed)
Denied.  Dr. Regis Bill is not the primary care physician for this patient.

## 2016-08-17 ENCOUNTER — Telehealth: Payer: Self-pay | Admitting: Family Medicine

## 2016-08-17 ENCOUNTER — Telehealth: Payer: Self-pay | Admitting: *Deleted

## 2016-08-17 NOTE — Telephone Encounter (Signed)
Is this okay to refill? I'm not sure if she established with you or not

## 2016-08-17 NOTE — Telephone Encounter (Signed)
Patient called and advised her PCP has left [facility] and they will not fill her Metformin Rx without an appt. She advised she is out and wants me to ask Dr [name] to fill for her until she is able to get a new provider. Advised her her provider left in July but will ask the doctor and see what he says. She advised she works in [place] and has been very busy. Advised her will ask the doctor and call her back once he responds.

## 2016-08-17 NOTE — Telephone Encounter (Signed)
Do you mind scheduling her for a follow up? We have openings tomorrow & on Friday.  Thank you!

## 2016-08-17 NOTE — Telephone Encounter (Signed)
Pt need new Rx for Metformin  Pharm:  [place]

## 2016-08-17 NOTE — Telephone Encounter (Signed)
She needs f/u appt for chronic problems, HTN included. I saw pt 02/2016 for acute visit.  Thanks, BJ

## 2016-08-17 NOTE — Telephone Encounter (Signed)
Called pt to let her know of the msg below and she was very rude did not want to hear that she had to come in and do a follow-up with someone pt stated all she want is her metformin b/c she is a diabetic and nothing has changed. Pt hung up b./c she was not given what she wanted.

## 2016-08-18 ENCOUNTER — Other Ambulatory Visit: Payer: Self-pay | Admitting: *Deleted

## 2016-08-18 MED ORDER — METFORMIN HCL 500 MG PO TABS: ORAL_TABLET | ORAL | 3 refills | Status: DC

## 2016-08-18 NOTE — Addendum Note (Signed)
Addended by: Kateri Mc E on: 08/18/2016 07:58 AM   Modules accepted: Orders

## 2016-08-18 NOTE — Telephone Encounter (Signed)
She may have 3 refills.

## 2016-08-18 NOTE — Telephone Encounter (Signed)
Patient notified and reminded to find a PCP before she needs refills.

## 2016-08-28 ENCOUNTER — Other Ambulatory Visit: Payer: Self-pay | Admitting: Internal Medicine

## 2016-08-28 DIAGNOSIS — B20 Human immunodeficiency virus [HIV] disease: Secondary | ICD-10-CM

## 2016-09-02 ENCOUNTER — Other Ambulatory Visit: Payer: Managed Care, Other (non HMO)

## 2016-09-02 DIAGNOSIS — B20 Human immunodeficiency virus [HIV] disease: Secondary | ICD-10-CM

## 2016-09-02 LAB — T-HELPER CELL (CD4) - (RCID CLINIC ONLY)
CD4 % Helper T Cell: 38 % (ref 33–55)
CD4 T Cell Abs: 1190 /uL (ref 400–2700)

## 2016-09-02 NOTE — Addendum Note (Signed)
Addended by: Lorne Skeens D on: 09/02/2016 09:24 AM   Modules accepted: Orders

## 2016-09-07 LAB — HIV-1 RNA QUANT-NO REFLEX-BLD: HIV-1 RNA Quant, Log: <1.3 Log copies/mL (ref <1.30)

## 2016-09-08 ENCOUNTER — Other Ambulatory Visit: Payer: Self-pay | Admitting: Internal Medicine

## 2016-09-19 ENCOUNTER — Ambulatory Visit (INDEPENDENT_AMBULATORY_CARE_PROVIDER_SITE_OTHER): Payer: Managed Care, Other (non HMO) | Admitting: Internal Medicine

## 2016-09-19 ENCOUNTER — Encounter: Payer: Self-pay | Admitting: Internal Medicine

## 2016-09-19 DIAGNOSIS — B20 Human immunodeficiency virus [HIV] disease: Secondary | ICD-10-CM

## 2016-09-19 DIAGNOSIS — F320 Major depressive disorder, single episode, mild: Secondary | ICD-10-CM | POA: Diagnosis not present

## 2016-09-19 DIAGNOSIS — F17200 Nicotine dependence, unspecified, uncomplicated: Secondary | ICD-10-CM | POA: Diagnosis not present

## 2016-09-19 NOTE — Progress Notes (Signed)
Patient Active Problem List   Diagnosis Date Noted  . Human immunodeficiency virus (HIV) disease ([place]) 07/24/2006    Priority: High  . Depression 07/24/2006    Priority: High  . Hyperglycemia 04/15/2015    Priority: Medium  . Hidradenitis suppurativa of right >> left axilla 01/30/2012    Priority: Medium  . 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 GRAVE'S DISEASE 01/01/2008    Priority: Medium  . TOBACCO USER 02/08/2007    Priority: Medium  . Essential hypertension 07/24/2006    Priority: Medium  . ASTHMA 07/24/2006    Priority: Medium  . Allergic rhinitis 02/18/2016  . Right shoulder pain 02/18/2016  . Type 2 diabetes mellitus, uncontrolled ([place]) 06/23/2015  . Renal calculi 10/29/2014  . Recurrent boils 06/11/2014  . Abscess of breast, right 01/09/2013  . Vaginal discharge 02/09/2012  . SARCOIDOSIS 02/08/2007  . THYROID NODULE, RIGHT 02/08/2007  . 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 GERD 07/24/2006    Patient's Medications  New Prescriptions   No medications on file  Previous Medications   ACETAMINOPHEN (TYLENOL) 500 MG TABLET    Take 1,000 mg by mouth every 6 (six) hours as needed for mild pain.   ALBUTEROL (PROVENTIL HFA;VENTOLIN HFA) 108 (90 BASE) MCG/ACT INHALER    Inhale 2 puffs into the lungs every 6 (six) hours as needed for wheezing or shortness of breath.   AMLODIPINE (NORVASC) 5 MG TABLET    TAKE 1 TABLET (5 MG TOTAL) BY MOUTH DAILY.   CYCLOBENZAPRINE (FLEXERIL) 10 MG TABLET    Take 1 tablet (10 mg total) by mouth 2 (two) times daily as needed for muscle spasms.   GLUCOSE BLOOD (ONETOUCH VERIO) TEST STRIP    USE TO CHECK BLOOD SUGAR DAILY AND PRN   HYDROCHLOROTHIAZIDE (MICROZIDE) 12.5 MG CAPSULE    Take 1 capsule (12.5 mg total) by mouth daily.   LEVOTHYROXINE (SYNTHROID, LEVOTHROID) 200 MCG TABLET    TAKE 1 TABLET (200 MCG TOTAL) BY MOUTH DAILY.   LOSARTAN (COZAAR) 50 MG TABLET    Take 1 tablet (50 mg total) by  mouth daily.   METFORMIN (GLUCOPHAGE) 500 MG TABLET    TAKE 1 TABLET (500 MG TOTAL) BY MOUTH 2 (TWO) TIMES DAILY WITH A MEAL.   MOMETASONE (NASONEX) 50 MCG/ACT NASAL SPRAY    Place 2 sprays into the nose daily.   MULTIPLE VITAMIN (MULTIVITAMIN) TABLET    Take 1 tablet by mouth daily.   ODEFSEY 200-25-25 MG TABS TABLET    TAKE 1 TABLET BY MOUTH DAILY.   ONE TOUCH LANCETS MISC    USE TO CHECK BLOOD SUGAR DAILY AND PRN  Modified Medications   No medications on file  Discontinued Medications   FLUCONAZOLE (DIFLUCAN) 150 MG TABLET    Take 1 tablet (150 mg total) by mouth once. May repeat in 3 days if needed.    Subjective: [name] is in for her routine HIV follow-up visit. She has had no problems obtaining or tolerating her Odefsey. She recalls missing 2 doses when she got to work and remembered that she had not taking her medication out of her pillbox that morning. She has a new job working once [employer] that she enjoys much more than her old job. She does have a 45 minute commute both ways though. She is not feeling depressed but does have periods of anxiety still. She has not set up a follow-up appointment with our behavioral health counselor  yet. She continues to smoke cigarettes. She has thought about trying nicotine patches again which helped her quit in the past. She has not set a quit date yet.   Review of Systems: Review of Systems  Constitutional: Positive for malaise/fatigue. Negative for chills, diaphoresis, fever and weight loss.  HENT: Negative for sore throat.   Respiratory: Negative for cough, sputum production and shortness of breath.   Cardiovascular: Negative for chest pain.  Gastrointestinal: Negative for abdominal pain, diarrhea, heartburn, nausea and vomiting.  Genitourinary: Negative for dysuria and frequency.  Musculoskeletal: Negative for joint pain and myalgias.  Skin: Negative for rash.  Neurological: Negative for dizziness and headaches.  Psychiatric/Behavioral: Negative  for depression and substance abuse. The patient is nervous/anxious.     Past Medical History:  Diagnosis Date  . Anemia    Normocytic  . ASTHMA 07/24/2006  . Bronchitis 2005  . 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 Gastroenteritis 07/10/07  . GERD 07/24/2006  . GRAVE'S DISEASE 01/01/2008  . History of hidradenitis suppurativa   . History of thrush   . HIV DISEASE 07/24/2006   dx March 05  . HYPERTENSION 07/24/2006  . Hyperthyroidism 08/2006   Grave's Disease -diffuse radiotracer uptake 08/25/06 Thyroid scan-Cold nodule to R lower lobe of thyrorid  . Menometrorrhagia    hx of  . Nephrolithiasis   . Pneumonia 2005  . 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 Sarcoidosis ([place]) 02/08/2007   dx as a teenager in [place] from abnl CXR. Completed 2 yrs Prednisone after lung bx confirmation. No symptoms since then.  . THYROID NODULE, RIGHT 02/08/2007    Social History  Substance Use Topics  . Smoking status: Current Some Day Smoker    Packs/day: 1.00    Years: 15.00    Types: Cigarettes  . Smokeless tobacco: Never Used  . Alcohol use No    Family History  Problem Relation Age of Onset  . Cancer Other     FH of Breast Cancer  . Cancer Other     Fh of Lung Cancer  . Heart disease Other   . Hypertension Other   . Stroke Other     Grandparent  . Kidney disease Other     Grandparent  . Diabetes Other     FH of Diabetes  . Hypertension Mother   . Cancer Mother     throat  . Hypertension Father   . Hypertension Sister   . Cancer Maternal Uncle     Stomach Cancer    Allergies  Allergen Reactions  . Tape Rash    Rash   . Genvoya [Elviteg-Cobic-Emtricit-Tenofaf] Hives  . Lisinopril Cough  . Xylocaine [Lidocaine]     Unknown , patient states she has had lidocaine for IV starts and at the dentist with no problems    Objective:  Vitals:   09/19/16 1511  BP: (!) 172/91  Pulse: 84  Temp: 98.7 F (37.1 C)  TempSrc: Oral  Weight: 253 lb 12 oz (115.1 kg)    Body mass index is 42.23 kg/m.  Physical Exam  Constitutional: She is oriented to person, place, and time.  She is in good spirits.  HENT:  Mouth/Throat: No oropharyngeal exudate.  Eyes: Conjunctivae are normal.  Cardiovascular: Normal rate and regular rhythm.   No murmur heard. Pulmonary/Chest: Breath sounds normal. She has no wheezes. She has no rales.  Abdominal: Soft. She exhibits no mass. There is no tenderness.  Musculoskeletal: Normal range of motion.  Neurological: She is alert and oriented to  person, place, and time.  Skin: No rash noted.  Psychiatric: Mood and affect normal.    Lab Results Lab Results  Component Value Date   WBC 8.6 12/03/2015   HGB 13.3 12/03/2015   HCT 40.0 12/03/2015   MCV 85.3 12/03/2015   PLT 436.0 (H) 12/03/2015    Lab Results  Component Value Date   CREATININE 0.96 12/03/2015   BUN 16 12/03/2015   NA 140 12/03/2015   K 3.7 12/03/2015   CL 102 12/03/2015   CO2 31 12/03/2015    Lab Results  Component Value Date   ALT 29 09/23/2015   AST 24 09/23/2015   ALKPHOS 83 09/23/2015   BILITOT 0.4 09/23/2015    Lab Results  Component Value Date   CHOL 186 10/23/2013   HDL 40 10/23/2013   LDLCALC 120 (H) 10/23/2013   TRIG 132 10/23/2013   CHOLHDL 4.7 10/23/2013   HIV 1 RNA Quant (copies/mL)  Date Value  09/02/2016 <20  09/23/2015 <20  03/17/2015 <20   CD4 T Cell Abs (/uL)  Date Value  09/02/2016 1,190  09/23/2015 1,040  03/17/2015 790     Problem List Items Addressed This Visit      High   Depression    She initially told me that she did not feel depressed but she then said that on weekends she will often not getting out of bed at all and she has been having periods where she feels like she is having a panic attack. I encouraged her to try to find 10-15 minutes each day to get some regular exercise. I will also have her schedule a follow-up visit with [name], our behavioral health counselor.      Human  immunodeficiency virus (HIV) disease ([place])    Her infection remains under excellent, long-term control. She will continue Odefsey in follow-up after lab work in 6 months.      Relevant Orders   T-helper cell (CD4)- ([facility] clinic only)   HIV 1 RNA quant-no reflex-bld   CBC   Comprehensive metabolic panel   Lipid panel   RPR     Medium   TOBACCO USER    I encouraged her to go ahead and restart nicotine patches and set a quit date.           Michel Bickers, MD [facility] [phone] pager   [phone] cell 09/19/2016, 3:36 PM

## 2016-09-19 NOTE — Assessment & Plan Note (Signed)
Her infection remains under excellent, long-term control. She will continue Odefsey in follow-up after lab work in 6 months.

## 2016-09-19 NOTE — Assessment & Plan Note (Signed)
I encouraged her to go ahead and restart nicotine patches and set a quit date.

## 2016-09-19 NOTE — Assessment & Plan Note (Signed)
She initially told me that she did not feel depressed but she then said that on weekends she will often not getting out of bed at all and she has been having periods where she feels like she is having a panic attack. I encouraged her to try to find 10-15 minutes each day to get some regular exercise. I will also have her schedule a follow-up visit with Curley Spice, our behavioral health counselor.

## 2016-09-21 ENCOUNTER — Encounter: Payer: Self-pay | Admitting: Primary Care

## 2016-09-21 ENCOUNTER — Ambulatory Visit (INDEPENDENT_AMBULATORY_CARE_PROVIDER_SITE_OTHER): Payer: Managed Care, Other (non HMO) | Admitting: Primary Care

## 2016-09-21 VITALS — BP 148/96 | HR 73 | Temp 97.8°F | Ht 65.0 in | Wt 255.8 lb

## 2016-09-21 DIAGNOSIS — IMO0001 Reserved for inherently not codable concepts without codable children: Secondary | ICD-10-CM

## 2016-09-21 DIAGNOSIS — E119 Type 2 diabetes mellitus without complications: Secondary | ICD-10-CM | POA: Diagnosis not present

## 2016-09-21 DIAGNOSIS — J4520 Mild intermittent asthma, uncomplicated: Secondary | ICD-10-CM

## 2016-09-21 DIAGNOSIS — E1165 Type 2 diabetes mellitus with hyperglycemia: Secondary | ICD-10-CM

## 2016-09-21 DIAGNOSIS — E890 Postprocedural hypothyroidism: Secondary | ICD-10-CM

## 2016-09-21 DIAGNOSIS — E039 Hypothyroidism, unspecified: Secondary | ICD-10-CM | POA: Diagnosis not present

## 2016-09-21 DIAGNOSIS — I10 Essential (primary) hypertension: Secondary | ICD-10-CM

## 2016-09-21 DIAGNOSIS — D869 Sarcoidosis, unspecified: Secondary | ICD-10-CM

## 2016-09-21 DIAGNOSIS — K219 Gastro-esophageal reflux disease without esophagitis: Secondary | ICD-10-CM

## 2016-09-21 DIAGNOSIS — B20 Human immunodeficiency virus [HIV] disease: Secondary | ICD-10-CM

## 2016-09-21 DIAGNOSIS — R0602 Shortness of breath: Secondary | ICD-10-CM

## 2016-09-21 LAB — TSH: TSH: 6.49 u[IU]/mL — ABNORMAL HIGH (ref 0.35–4.50)

## 2016-09-21 LAB — MICROALBUMIN / CREATININE URINE RATIO
CREATININE, U: 84.8 mg/dL
MICROALB/CREAT RATIO: 63 mg/g — AB (ref 0.0–30.0)
Microalb, Ur: 53.4 mg/dL — ABNORMAL HIGH (ref 0.0–1.9)

## 2016-09-21 LAB — HEMOGLOBIN A1C: Hgb A1c MFr Bld: 7.7 % — ABNORMAL HIGH (ref 4.6–6.5)

## 2016-09-21 MED ORDER — LOSARTAN POTASSIUM 50 MG PO TABS: 50.0000 mg | ORAL_TABLET | Freq: Every day | ORAL | 3 refills | Status: DC

## 2016-09-21 MED ORDER — ALBUTEROL SULFATE HFA 108 (90 BASE) MCG/ACT IN AERS: 2.0000 | INHALATION_SPRAY | Freq: Four times a day (QID) | RESPIRATORY_TRACT | 5 refills | Status: DC | PRN

## 2016-09-21 NOTE — Patient Instructions (Addendum)
Start Losartan 50 mg tablets for high blood pressure and to help protect your kidneys from diabetes.  Continue taking Amlodipine 5 mg and hydrochlorothiazide 12.5 mg.  Complete lab work prior to leaving today. I will notify you of your results once received.   Stop by the front desk and speak with either Rosaria Ferries or Ebony Hail regarding your referral to Pulmonology.  Please schedule a physical with me in 6 months. You may also schedule a lab only appointment 3-4 days prior. We will discuss your lab results in detail during your physical.  It was a pleasure to meet you today! Please don't hesitate to call me with any questions. Welcome to [facility]!

## 2016-09-21 NOTE — Assessment & Plan Note (Signed)
Chest x-ray in 2017 stable. Chronic cough over the last 6+ months also with swelling and closure of her throat. Given history of asthma and sarcoidosis, coupled with a six-month history of throat closure will send to pulmonology for further evaluation.

## 2016-09-21 NOTE — Assessment & Plan Note (Signed)
She denies esophageal reflux symptoms. Question etiology of her 6 month history of throat closure. Could be GI related, will start with pulmonology evaluation given lack of esophageal symptoms. May require upper endoscopy if pulmonology evaluation unremarkable.

## 2016-09-21 NOTE — Assessment & Plan Note (Signed)
Uses albuterol intermittently during warmer and humid months. Discussed use of albuterol during episodes of throat tightness. Refill of albuterol inhaler sent to pharmacy. Pulmonology referral placed for reevaluation of sarcoidosis, asthma, tracheal tightness.

## 2016-09-21 NOTE — Assessment & Plan Note (Signed)
Currently following with infectious disease, recent labs reviewed and are stable.

## 2016-09-21 NOTE — Assessment & Plan Note (Signed)
A1c is 6.6 in February 2017. Suspect symptoms she describes could be hypoglycemia, unlikely with metformin. Will repeat A1c today. May need to reduce metformin to 500 mg once daily. We'll make necessary adjustments pending lab results. Urine microalbumin pending. Restarted losartan for renal protection.

## 2016-09-21 NOTE — Progress Notes (Signed)
Pre visit review using our clinic review tool, if applicable. No additional management support is needed unless otherwise documented below in the visit note. 

## 2016-09-21 NOTE — Progress Notes (Signed)
Subjective:    Patient ID: [name], female    DOB: Jul 13, 1966, 50 y.o.   MRN: [id number]  HPI  [name] is a 50 year old female who presents today to establish care and discuss the problems mentioned below. Will obtain old records.  1) Type 2 Diabetes: Diagnosed in 2016. Currently managed on Metformin 500 mg BID. Her last A1C was 6.6 in February 2017. She's checking her sugars twice daily fasting which is running 130's (fasting in the morning), over the last 2 weeks she has noticed fasting readings of 80's. Her sugars in the afternoon run in the 120-130's. She has noticed blurred vision, shaking/jittery feeling.   2) Essential Hypertension: Currently managed on amlodipine 5 mg, HCTZ 12.5 mg. She was previously managed on Losartan 50 mg but has not taking this since May 2017. She cannot recall why she is no longer on losartan, but does remember a cough with lisinopril. She never noticed improvement in her cough when she stopped taking lisinopril. Her BP in the clinic today is 148/96. She does not check her BP at home. Her BP at her GYN office on Monday was 172/91.  3) Hypothyroidism: Currently managed on levothyroxine 200 mcg. Her last TSH was stable in February 2017. History of total thyroidectomy in 2010 due to non cancerous nodules. She has a strong family history of hypothyroidism.  4) Throat swelling: History of Sarcoidosis, chronic cough, asthma. Currently managed on albuterol and Nasonex. She's been experiencing intermittent episodes of throat swelling over the past 6 months. Her episode will begin with throat closure inability to breathe or talk. This will sometimes occur after meals but has also occurred without eating. She feels as though her "windpipe" become swollen. Over the last 2-3 weeks she's noticed the symptoms have become worse. These episodes last several minutes in duration. She's used her albuterol inhaler for 2 of these episodes with improvement. She uses her albuterol  typically during warmer humid weather.  She denies choking, aspiration, esophageal reflux, abdominal pain, food allergies. She is not followed with a pulmonologist recently for reevaluation of sarcoidosis.  5) HIV: Diagnosed in 2004. She follows with Infectious Disease and sees Dr. [name]. She underwent recent viral load in November which was stable.  Review of Systems  Constitutional: Negative for chills and fever.  HENT: Negative for congestion, facial swelling and trouble swallowing.        Throat closure  Eyes: Positive for visual disturbance.  Respiratory: Positive for cough and shortness of breath. Negative for wheezing.   Cardiovascular: Negative for chest pain.  Neurological: Positive for weakness. Negative for dizziness and headaches.       Past Medical History:  Diagnosis Date  . Anemia    Normocytic  . ASTHMA 07/24/2006  . Bronchitis 2005  . 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 Gastroenteritis 07/10/07  . GERD 07/24/2006  . GRAVE'S DISEASE 01/01/2008  . History of hidradenitis suppurativa   . History of thrush   . HIV DISEASE 07/24/2006   dx March 05  . HYPERTENSION 07/24/2006  . Hyperthyroidism 08/2006   Grave's Disease -diffuse radiotracer uptake 08/25/06 Thyroid scan-Cold nodule to R lower lobe of thyrorid  . Menometrorrhagia    hx of  . Nephrolithiasis   . Pneumonia 2005  . 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 Sarcoidosis ([place]) 02/08/2007   dx as a teenager in [place] from abnl CXR. Completed 2 yrs Prednisone after lung bx confirmation. No symptoms since then.  . THYROID NODULE, RIGHT 02/08/2007  Social History   Social History  . Marital status: Single    Spouse name: N/A  . Number of children: 1  . Years of education: N/A   Occupational History  . [employer]   Social History Main Topics  . Smoking status: Current Every Day Smoker    Packs/day: 0.50    Years: 15.00    Types: Cigarettes  . Smokeless tobacco:  Never Used  . Alcohol use No  . Drug use: No  . Sexual activity: Not Currently     Comment: declined condoms   Other Topics Concern  . Not on file   Social History Narrative  . No narrative on file    Past Surgical History:  Procedure Laterality Date  . BREAST SURGERY  1997   Breast Reduction   . CYSTOSCOPY W/ URETERAL STENT REMOVAL  11/09/2012   Procedure: CYSTOSCOPY WITH STENT REMOVAL;  Surgeon: Alexis Frock, MD;  Location: [facility];  Service: Urology;  Laterality: Right;  . CYSTOSCOPY WITH RETROGRADE PYELOGRAM, URETEROSCOPY AND STENT PLACEMENT  11/09/2012   Procedure: CYSTOSCOPY WITH RETROGRADE PYELOGRAM, URETEROSCOPY AND STENT PLACEMENT;  Surgeon: Alexis Frock, MD;  Location: [facility];  Service: Urology;  Laterality: Left;  LEFT URETEROSCOPY, STONE MANIPULATION, left STENT exchange   . CYSTOSCOPY WITH STENT PLACEMENT  10/02/2012   Procedure: CYSTOSCOPY WITH STENT PLACEMENT;  Surgeon: Alexis Frock, MD;  Location: [facility];  Service: Urology;  Laterality: Left;  . DILATION AND CURETTAGE OF UTERUS  Feb 2004   s/p for 1st trimester nonviable pregnancy  . EYE SURGERY     sty under eyelid  . INCISE AND DRAIN ABCESS  Nov 03   s/p I &D for righ inframmary fold hidradenitis  . INCISION AND DRAINAGE PERITONSILLAR ABSCESS  Mar 03  . IRRIGATION AND DEBRIDEMENT ABSCESS  01/31/2012   Procedure: IRRIGATION AND DEBRIDEMENT ABSCESS;  Surgeon: Shann Medal, MD;  Location: [facility];  Service: General;  Laterality: Right;  right breast and axilla   . NEPHROLITHOTOMY  10/02/2012   Procedure: NEPHROLITHOTOMY PERCUTANEOUS;  Surgeon: Alexis Frock, MD;  Location: [facility];  Service: Urology;  Laterality: Right;  First Stage Percutaneous Nephrolithotomy with Surgeon Access, Left Ureteral Stent    . NEPHROLITHOTOMY  10/04/2012   Procedure: NEPHROLITHOTOMY PERCUTANEOUS SECOND LOOK;  Surgeon: Alexis Frock, MD;  Location: [facility];  Service: Urology;  Laterality: Right;     . NEPHROLITHOTOMY  10/08/2012    Procedure: NEPHROLITHOTOMY PERCUTANEOUS;  Surgeon: Alexis Frock, MD;  Location: [facility];  Service: Urology;  Laterality: Right;  THIRD STAGE, nephrostomy tube exchange x 2  . NEPHROLITHOTOMY  10/11/2012   Procedure: NEPHROLITHOTOMY PERCUTANEOUS SECOND LOOK;  Surgeon: Alexis Frock, MD;  Location: [facility];  Service: Urology;  Laterality: Right;  RIGHT 4 STAGE PERCUTANOUS NEPHROLITHOTOMY, right URETEROSCOPY WITH HOLMIUM LASER   . [place]  1994  . TOTAL THYROIDECTOMY  2010    Family History  Problem Relation Age of Onset  . Cancer Other     FH of Breast Cancer  . Cancer Other     Fh of Lung Cancer  . Heart disease Other   . Hypertension Other   . Stroke Other     Grandparent  . Kidney disease Other     Grandparent  . Diabetes Other     FH of Diabetes  . Hypertension Mother   . Cancer Mother     throat  . Hypertension Father   . Hypertension Sister   . Cancer Maternal Uncle  Stomach Cancer    Allergies  Allergen Reactions  . Tape Rash    Rash   . Genvoya [Elviteg-Cobic-Emtricit-Tenofaf] Hives  . Lisinopril Cough  . Xylocaine [Lidocaine]     Unknown , patient states she has had lidocaine for IV starts and at the dentist with no problems    Current Outpatient Prescriptions on File Prior to Visit  Medication Sig Dispense Refill  . amLODipine (NORVASC) 5 MG tablet TAKE 1 TABLET (5 MG TOTAL) BY MOUTH DAILY. 90 tablet 0  . glucose blood (ONETOUCH VERIO) test strip USE TO CHECK BLOOD SUGAR DAILY AND PRN 100 each 4  . hydrochlorothiazide (MICROZIDE) 12.5 MG capsule Take 1 capsule (12.5 mg total) by mouth daily. 30 capsule 4  . levothyroxine (SYNTHROID, LEVOTHROID) 200 MCG tablet TAKE 1 TABLET (200 MCG TOTAL) BY MOUTH DAILY. 30 tablet 5  . metFORMIN (GLUCOPHAGE) 500 MG tablet TAKE 1 TABLET (500 MG TOTAL) BY MOUTH 2 (TWO) TIMES DAILY WITH A MEAL. 60 tablet 3  . Multiple Vitamin (MULTIVITAMIN) tablet Take 1 tablet by mouth daily.    . ODEFSEY 200-25-25 MG TABS tablet TAKE 1 TABLET  BY MOUTH DAILY. 30 tablet 2  . ONE TOUCH LANCETS MISC USE TO CHECK BLOOD SUGAR DAILY AND PRN 100 each 4  . albuterol (PROVENTIL HFA;VENTOLIN HFA) 108 (90 BASE) MCG/ACT inhaler Inhale 2 puffs into the lungs every 6 (six) hours as needed for wheezing or shortness of breath. (Patient not taking: Reported on 09/21/2016) 1 Inhaler 2  . cyclobenzaprine (FLEXERIL) 10 MG tablet Take 1 tablet (10 mg total) by mouth 2 (two) times daily as needed for muscle spasms. (Patient not taking: Reported on 09/21/2016) 20 tablet 0  . mometasone (NASONEX) 50 MCG/ACT nasal spray Place 2 sprays into the nose daily. (Patient not taking: Reported on 09/21/2016) 17 g 3   No current facility-administered medications on file prior to visit.     BP (!) 148/96   Pulse 73   Temp 97.8 F (36.6 C) (Oral)   Ht 5\' 5"  (1.651 m)   Wt 255 lb 12.8 oz (116 kg)   SpO2 96%   BMI 42.57 kg/m    Objective:   Physical Exam  Constitutional: She appears well-nourished.  Neck: Neck supple. No tracheal deviation present.  Cardiovascular: Normal rate and regular rhythm.   Pulmonary/Chest: Effort normal and breath sounds normal. No stridor. She has no wheezes. Rales: .basic.  Lymphadenopathy:    She has no cervical adenopathy.  Skin: Skin is warm and dry.  Psychiatric: She has a normal mood and affect.          Assessment & Plan:

## 2016-09-21 NOTE — Assessment & Plan Note (Signed)
Above goal in the office today, also during ID visit yesterday. Continue amlodipine 5 mg, HCTZ 12.5 mg. Add in losartan 50 mg for blood pressure control and renal protection given diabetes. Will have her monitor her blood pressure and follow-up in 2-3 weeks for recheck with BMP.

## 2016-09-21 NOTE — Assessment & Plan Note (Signed)
Managed on levothyroxine 200 g daily. TSH in February 2017 stable. Continue current regimen.

## 2016-09-22 ENCOUNTER — Other Ambulatory Visit: Payer: Self-pay | Admitting: Primary Care

## 2016-09-25 ENCOUNTER — Other Ambulatory Visit: Payer: Self-pay | Admitting: Primary Care

## 2016-09-25 DIAGNOSIS — E1129 Type 2 diabetes mellitus with other diabetic kidney complication: Secondary | ICD-10-CM

## 2016-09-25 DIAGNOSIS — R809 Proteinuria, unspecified: Principal | ICD-10-CM

## 2016-09-25 MED ORDER — METFORMIN HCL 1000 MG PO TABS: 1000.0000 mg | ORAL_TABLET | Freq: Two times a day (BID) | ORAL | 3 refills | Status: DC

## 2016-10-03 ENCOUNTER — Ambulatory Visit: Payer: Self-pay | Admitting: Primary Care

## 2016-10-03 DIAGNOSIS — Z0289 Encounter for other administrative examinations: Secondary | ICD-10-CM

## 2016-10-04 ENCOUNTER — Telehealth: Payer: Self-pay | Admitting: Primary Care

## 2016-10-04 NOTE — Telephone Encounter (Signed)
Yes, please reschedule

## 2016-10-04 NOTE — Telephone Encounter (Signed)
Patient did not come in for their appointment on 10/04/16 for bp follow up   Please let me know if patient needs to be contacted immediately for follow up or no follow up needed.

## 2016-10-06 NOTE — Telephone Encounter (Signed)
L/m for pt to call to reschedule appt

## 2016-10-13 ENCOUNTER — Encounter: Payer: Self-pay | Admitting: Internal Medicine

## 2016-10-13 ENCOUNTER — Ambulatory Visit (INDEPENDENT_AMBULATORY_CARE_PROVIDER_SITE_OTHER)
Admission: RE | Admit: 2016-10-13 | Discharge: 2016-10-13 | Disposition: A | Discharge status: Home / Self Care | Payer: Managed Care, Other (non HMO) | Source: Ambulatory Visit | Attending: Internal Medicine | Admitting: Internal Medicine

## 2016-10-13 ENCOUNTER — Ambulatory Visit (INDEPENDENT_AMBULATORY_CARE_PROVIDER_SITE_OTHER): Payer: Managed Care, Other (non HMO) | Admitting: Internal Medicine

## 2016-10-13 DIAGNOSIS — I10 Essential (primary) hypertension: Secondary | ICD-10-CM

## 2016-10-13 DIAGNOSIS — R0602 Shortness of breath: Secondary | ICD-10-CM

## 2016-10-13 DIAGNOSIS — D869 Sarcoidosis, unspecified: Secondary | ICD-10-CM | POA: Diagnosis not present

## 2016-10-13 MED ORDER — VALSARTAN 160 MG PO TABS: 160.0000 mg | ORAL_TABLET | Freq: Every day | ORAL | 11 refills | Status: DC

## 2016-10-13 MED ORDER — FAMOTIDINE 20 MG PO TABS: ORAL_TABLET | ORAL | 11 refills | Status: DC

## 2016-10-13 MED ORDER — PANTOPRAZOLE SODIUM 40 MG PO TBEC: 40.0000 mg | DELAYED_RELEASE_TABLET | Freq: Every day | ORAL | 11 refills | Status: DC

## 2016-10-13 NOTE — Patient Instructions (Addendum)
                                       Stop losartan and start diovan (valsartan) 160 mg one daily   Pantoprazole (protonix) 40 mg   Take  30-60 min before first meal of the day and Pepcid (famotidine)  20 mg one @  bedtime until return to office - this is the best way to tell whether stomach acid is contributing to your problem.    GERD (REFLUX)  is an extremely common cause of respiratory symptoms just like yours , many times with no obvious heartburn at all.    It can be treated with medication, but also with lifestyle changes including elevation of the head of your bed (ideally with 6 inch  bed blocks),  Smoking cessation, avoidance of late meals, excessive alcohol, and avoid fatty foods, chocolate, peppermint, colas, red wine, and acidic juices such as orange juice.  NO MINT OR MENTHOL PRODUCTS SO NO COUGH DROPS   USE SUGARLESS CANDY INSTEAD (Jolley ranchers or Stover's or Life Savers) or even ice chips will also do - the key is to swallow to prevent all throat clearing. NO OIL BASED VITAMINS - use powdered substitutes.   The key is to stop smoking completely before smoking completely stops you!   Please remember to go to the   x-ray department downstairs for your tests - we will call you with the results when they are available.      Please schedule a follow up office visit in 4 weeks, sooner if needed with pfts prior

## 2016-10-13 NOTE — Progress Notes (Signed)
Subjective:    Patient ID: [name], female    DOB: [birth year],  MRN: [id number]  HPI  32 yobf healthy child, played basketball in [place] school but "just a little zumba"  since and Active smoker  S/p complete thyroidectomy 2010 then w/in a year had sensation needed to press down on/ hold throat with cough or swallowing gradually worse over time - never mentioned to surgeon who she doesn't remember - and much worse summer 2017 andsevere since  Dec 2017 since caught a cold referred to pulmonary clinic 10/13/2016 by     Felicia Friendly NP  [facility]/ has HIV but under excellent control per [name] as of 09/19/16 ov    10/13/2016 1st [facility] office visit/ [name]   Chief Complaint  Patient presents with  . Pulmonary Consult    Referred by Felicia. Alma Tate. Pt c/o cough and choking spells since June 2017. She states that when she swallows, she has to press on her throat to make food or drink go down.    albuterol doesn't really help/ not treated for gerd yet / sensation is present at rest of choking/ sob and min better p saba assoc with hacking dry upper airway cough day > noct   No obvious day to day or daytime variability or assoc excess/ purulent sputum or mucus plugs or hemoptysis or cp or chest tightness, subjective wheeze or overt sinus or hb symptoms. No unusual exp hx or h/o childhood pna/ asthma or knowledge of premature birth.  Sleeping ok without nocturnal  or early am exacerbation  of respiratory  c/o's or need for noct saba. Also denies any obvious fluctuation of symptoms with weather or environmental changes or other aggravating or alleviating factors except as outlined above   Current Medications, Allergies, Complete Past Medical History, Past Surgical History, Family History, and Social History were reviewed in [facility] record.             Review of Systems  Constitutional: Negative for chills, fever and unexpected weight change.    HENT: Negative for congestion, dental problem, ear pain, nosebleeds, postnasal drip, rhinorrhea, sinus pressure, sneezing, sore throat, trouble swallowing and voice change.   Eyes: Negative for visual disturbance.  Respiratory: Positive for cough and shortness of breath. Negative for choking.   Cardiovascular: Negative for chest pain and leg swelling.  Gastrointestinal: Negative for abdominal pain, diarrhea and vomiting.  Genitourinary: Negative for difficulty urinating.  Musculoskeletal: Negative for arthralgias.  Skin: Negative for rash.  Neurological: Negative for tremors, syncope and headaches.  Hematological: Does not bruise/bleed easily.       Objective:   Physical Exam  amb obese bf ? Belle affect/ nl tone to voice   Wt Readings from Last 3 Encounters:  10/13/16 248 lb (112.5 kg)  09/21/16 255 lb 12.8 oz (116 kg)  09/19/16 253 lb 12 oz (115.1 kg)    Vital signs reviewed  - Note on arrival 02 sats  96% on RA    HEENT: nl dentition, turbinates, and oropharynx. Nl external ear canals without cough reflex   NECK :  without JVD/Nodes/TM/ nl carotid upstrokes bilaterally   LUNGS: no acc muscle use,  Nl contour chest which is clear to A and P bilaterally without cough on insp or exp maneuvers   CV:  RRR  no s3 or murmur or increase in P2, nad no edema   ABD:  soft and nontender with nl inspiratory excursion in the supine  position. No bruits or organomegaly appreciated, bowel sounds nl  MS:  Nl gait/ ext warm without deformities, calf tenderness, cyanosis or clubbing No obvious joint restrictions   SKIN: warm and dry without lesions    NEURO:  alert, approp, nl sensorium with  no motor or cerebellar deficits apparent.     CXR PA and Lateral:   10/13/2016 :    I personally reviewed images and agree with radiology impression as follows:    No active cardiopulmonary disease.      Assessment & Plan:

## 2016-10-13 NOTE — Progress Notes (Signed)
   Subjective:    Patient ID: [name], female    DOB: May 30, 1966, 50 y.o.   MRN: [id number]  HPI    Review of Systems     Objective:   Physical Exam        Assessment & Plan:

## 2016-10-14 ENCOUNTER — Telehealth: Payer: Self-pay | Admitting: Internal Medicine

## 2016-10-14 DIAGNOSIS — E669 Obesity, unspecified: Secondary | ICD-10-CM | POA: Insufficient documentation

## 2016-10-14 MED ORDER — PANTOPRAZOLE SODIUM 40 MG PO TBEC: 40.0000 mg | DELAYED_RELEASE_TABLET | Freq: Every day | ORAL | 11 refills | Status: DC

## 2016-10-14 MED ORDER — VALSARTAN 160 MG PO TABS: 160.0000 mg | ORAL_TABLET | Freq: Every day | ORAL | 11 refills | Status: DC

## 2016-10-14 MED ORDER — FAMOTIDINE 20 MG PO TABS: ORAL_TABLET | ORAL | 11 refills | Status: DC

## 2016-10-14 NOTE — Assessment & Plan Note (Signed)
Carries this dx but note A good rule of thumb is that >95% of pts with active sarcoid in any organ will have some plain cxr changes - on the other hand  if there are active pulmonary symptoms the cxr will look much worse than the patient:  No evidence of either scenario here/ strongly doubt active dz

## 2016-10-14 NOTE — Telephone Encounter (Signed)
Pt needs protonix, pepcid, and diovan from yesterday's OV sent to different pharmacy.  This has been sent.  Nothing further needed.

## 2016-10-14 NOTE — Telephone Encounter (Signed)
Notes Recorded by Tanda Rockers, MD on 10/14/2016 at 5:33 AM EST Call pt: Reviewed cxr and no acute change so no change in recommendations made at [facility] --------- Spoke with pt, aware of results/recs.  Nothing further needed.

## 2016-10-14 NOTE — Assessment & Plan Note (Signed)
Complicated by hbp  Body mass index is 42.57 Lab Results  Component Value Date   TSH 6.49 (H) 09/21/2016     Contributing to gerd tendency/ doe/reviewed the need and the process to achieve and maintain neg calorie balance > defer f/u primary care including intermittently monitoring thyroid status

## 2016-10-14 NOTE — Assessment & Plan Note (Addendum)
Spirometry 10/13/2016  Including mid flows and curvature while actively symptomatic - exp curves only I very strongly doubt asthma, at least not apparent today so doesn't correlate with her refractory chronic symptoms which  are markedly disproportionate to objective findings and not clear this is a lung problem but pt does appear to have difficult airway management issues. DDX of  difficult airways management almost all start with A and  include Adherence, Ace Inhibitors, Acid Reflux, Active Sinus Disease, Alpha 1 Antitripsin deficiency, Anxiety masquerading as Airways dz,  ABPA,  Allergy(esp in young), Aspiration (esp in elderly), Adverse effects of meds,  Active smokers, A bunch of PE's (a small clot burden can't cause this syndrome unless there is already severe underlying pulm or vascular dz with poor reserve) plus two Bs  = Bronchiectasis and Beta blocker use..and one C= CHF   Adherence is always the initial "prime suspect" and is a multilayered concern that requires a "trust but verify" approach in every patient - starting with knowing how to use medications, especially inhalers, correctly, keeping up with refills and understanding the fundamental difference between maintenance and prns vs those medications only taken for a very short course and then stopped and not refilled.   Active smoking top of the usual lsit of suspects (see separate a/p)   ? Acid (or non-acid) GERD > always difficult to exclude as up to 75% of pts in some series report no assoc GI/ Heartburn symptoms> rec max (24h)  acid suppression and diet restrictions/ reviewed and instructions given in writing.   ? Anxiety > usually at the bottom of this list of usual suspects but should be much higher on this pt's based on H and P and note h/o depression  .  - this is clearly a chronic globus sensation and strongly doubt she has a structural problem that is improved by pressing her neck inward to swallow.  Needs ent eval then ? Trial of  gabapentin if fails gerd rx   ? ACEi related : For reasons that may related to vascular permability and nitric oxide pathways but not elevated  bradykinin levels (as seen with  ACEi use) losartan in the generic form has been reported now from mulitple sources  to cause a similar pattern of non-specific  upper airway symptoms as seen with acei.   This has not been reported with exposure to the other ARB's to date, so it seems reasonable for now to try either generic diovan or avapro if ARB needed or use an alternative class altogether.  See:  Lelon Frohlich Allergy Asthma Immunol  2008: 101: p [phone]   - try diovan instead of losartan until returns for pfts with f/v loop.  Total time devoted to counseling  > 50 % of 60 min office visit:  review case with pt/ discussion of options/alternatives/ personally creating written customized instructions  in presence of pt  then going over those specific  Instructions directly with the pt including how to use all of the meds but in particular covering each new medication in detail and the difference between the maintenance/automatic meds and the prns using an action plan format for the latter.  Please see AVS from this visit for a full list of these instructions

## 2016-10-14 NOTE — Assessment & Plan Note (Signed)
For reasons that may related to vascular permability and nitric oxide pathways but not elevated  bradykinin levels (as seen with  ACEi use) losartan in the generic form has been reported now from mulitple sources  to cause a similar pattern of non-specific  upper airway symptoms as seen with acei.   This has not been reported with exposure to the other ARB's to date, so it seems reasonable for now to try either generic diovan or avapro if ARB needed or use an alternative class altogether.  See:  Lelon Frohlich Allergy Asthma Immunol  2008: 101: p [phone]    Try diovan 160 mg daily until returns in 4 weeks

## 2016-10-23 ENCOUNTER — Other Ambulatory Visit: Payer: Self-pay | Admitting: Family Medicine

## 2016-11-11 ENCOUNTER — Ambulatory Visit (HOSPITAL_COMMUNITY)
Admission: RE | Admit: 2016-11-11 | Discharge: 2016-11-11 | Disposition: A | Discharge status: Home / Self Care | Payer: Managed Care, Other (non HMO) | Source: Ambulatory Visit | Attending: Internal Medicine | Admitting: Internal Medicine

## 2016-11-11 ENCOUNTER — Telehealth: Payer: Self-pay | Admitting: Internal Medicine

## 2016-11-11 ENCOUNTER — Ambulatory Visit (INDEPENDENT_AMBULATORY_CARE_PROVIDER_SITE_OTHER): Payer: Managed Care, Other (non HMO) | Admitting: Internal Medicine

## 2016-11-11 ENCOUNTER — Encounter: Payer: Self-pay | Admitting: Internal Medicine

## 2016-11-11 VITALS — BP 168/106 | HR 83 | Ht 65.0 in | Wt 246.0 lb

## 2016-11-11 DIAGNOSIS — R05 Cough: Secondary | ICD-10-CM

## 2016-11-11 DIAGNOSIS — F17210 Nicotine dependence, cigarettes, uncomplicated: Secondary | ICD-10-CM | POA: Diagnosis not present

## 2016-11-11 DIAGNOSIS — R0602 Shortness of breath: Secondary | ICD-10-CM | POA: Insufficient documentation

## 2016-11-11 DIAGNOSIS — I10 Essential (primary) hypertension: Secondary | ICD-10-CM | POA: Diagnosis not present

## 2016-11-11 DIAGNOSIS — R058 Other specified cough: Secondary | ICD-10-CM | POA: Insufficient documentation

## 2016-11-11 LAB — PULMONARY FUNCTION TEST
DL/VA % PRED: 94 %
DL/VA: 4.64 ml/min/mmHg/L
DLCO unc % pred: 78 %
DLCO unc: 20.05 ml/min/mmHg
FEF 25-75 Post: 3.89 L/sec
FEF 25-75 Pre: 3.49 L/sec
FEF2575-%Change-Post: 11 %
FEF2575-%Pred-Post: 150 %
FEF2575-%Pred-Pre: 135 %
FEV1-%Change-Post: 5 %
FEV1-%PRED-POST: 109 %
FEV1-%PRED-PRE: 103 %
FEV1-POST: 2.68 L
FEV1-PRE: 2.54 L
FEV1FVC-%Change-Post: 3 %
FEV1FVC-%Pred-Pre: 104 %
FEV6-%Change-Post: 0 %
FEV6-%PRED-PRE: 100 %
FEV6-%Pred-Post: 100 %
FEV6-POST: 2.99 L
FEV6-PRE: 2.98 L
FEV6FVC-%PRED-POST: 103 %
FEV6FVC-%PRED-PRE: 103 %
FVC-%Change-Post: 1 %
FVC-%PRED-PRE: 97 %
FVC-%Pred-Post: 99 %
FVC-POST: 3.03 L
FVC-PRE: 2.98 L
POST FEV6/FVC RATIO: 100 %
PRE FEV6/FVC RATIO: 100 %
Post FEV1/FVC ratio: 89 %
Pre FEV1/FVC ratio: 85 %
RV % PRED: 101 %
RV: 1.84 L
TLC % PRED: 93 %
TLC: 4.85 L

## 2016-11-11 MED ORDER — ALBUTEROL SULFATE (2.5 MG/3ML) 0.083% IN NEBU
2.5000 mg | INHALATION_SOLUTION | Freq: Once | RESPIRATORY_TRACT | Status: AC
  Administered 2016-11-11: 2.5 mg via RESPIRATORY_TRACT

## 2016-11-11 MED ORDER — FAMOTIDINE 20 MG PO TABS: ORAL_TABLET | ORAL | 11 refills | Status: DC

## 2016-11-11 MED ORDER — PANTOPRAZOLE SODIUM 40 MG PO TBEC: 40.0000 mg | DELAYED_RELEASE_TABLET | Freq: Every day | ORAL | 11 refills | Status: DC

## 2016-11-11 NOTE — Assessment & Plan Note (Addendum)
Complicated by hbp and ERV 38% on lung vol 11/11/2016   Body mass index is 40.94   Lab Results  Component Value Date   TSH 6.49 (H) 09/21/2016     Contributing to gerd tendency/ doe/reviewed the need and the process to achieve and maintain neg calorie balance > defer f/u primary care including intermittently monitoring thyroid status

## 2016-11-11 NOTE — Patient Instructions (Addendum)
Stop losartan and start diovan (valsartan) 160 mg one daily   Pantoprazole (protonix) 40 mg   Take  30-60 min before first meal of the day and Pepcid (famotidine)  20 mg one @  bedtime until seen by your primary doctor = this is the best way to tell whether stomach acid is contributing to your problem.    For drainage / throat tickle try take CHLORPHENIRAMINE  4 mg - take one every 4 hours as needed - available over the counter- may cause drowsiness so start with just a bedtime dose or two and see how you tolerate it before trying in daytime    GERD (REFLUX)  is an extremely common cause of respiratory symptoms just like yours , many times with no obvious heartburn at all.    It can be treated with medication, but also with lifestyle changes including elevation of the head of your bed (ideally with 6 inch  bed blocks),  Smoking cessation, avoidance of late meals, excessive alcohol, and avoid fatty foods, chocolate, peppermint, colas, red wine, and acidic juices such as orange juice.  NO MINT OR MENTHOL PRODUCTS SO NO COUGH DROPS   USE SUGARLESS CANDY INSTEAD (Jolley ranchers or Stover's or Life Savers) or even ice chips will also do - the key is to swallow to prevent all throat clearing. NO OIL BASED VITAMINS - use powdered substitutes.   The key is to stop smoking completely before smoking completely stops you!   Please see patient coordinator before you leave today  to schedule ENT eval   No pulmonary follow up is needed

## 2016-11-11 NOTE — Telephone Encounter (Signed)
Sp[facility] with [facility]ist [facility]  She states Pantoprazole and all other PPI's cause her antiretroviral meds to not be as effective  MW- please advise thanks!

## 2016-11-11 NOTE — Progress Notes (Signed)
Subjective:    Patient ID: [name], female    DOB: 1966/06/16,  MRN: [id number]    Brief patient profile:  74 yobf healthy child, played basketball in [employer] school but "just a little zumba"  since and Active smoker  S/p complete thyroidectomy 2010 then w/in a year had sensation needed to press down on/ hold throat with cough or swallowing gradually worse over time - never mentioned to surgeon who she doesn't remember - and much worse summer 2017 and severe since  Dec 2017 since caught a cold referred to pulmonary clinic 10/13/2016 by     Alma Friendly NP  [facility]/ has HIV but under excellent control per Dr Bridget Hartshorn as of 09/19/16 ov      History of Present Illness  10/13/2016 1st [facility] office visit/ Mireille Lacombe    Chief Complaint  Patient presents with  . Pulmonary Consult    Referred by Dr. Alma Friendly. Pt c/o cough and choking spells since June 2017. She states that when she swallows, she has to press on her throat to make food or drink go down.   albuterol doesn't really help/ not treated for gerd yet / sensation is present at rest of choking/ sob and min better p saba assoc with hacking dry upper airway cough day > noct  rec Stop losartan and start diovan (valsartan) 160 mg one daily  Pantoprazole (protonix) 40 mg   Take  30-60 min before first meal of the day and Pepcid (famotidine)  20 mg one @  bedtime until seen by your primary doctor = this is the best way to tell whether stomach acid is contributing to your problem.   GERD diet   11/11/2016  f/u ov/Kayla Deshaies re: uacs  Chief Complaint  Patient presents with  . Follow-up    PFT's done today. Breathing is unchanged. No new co's today.    ? Some better with choking episodes still has tickle in throat 4-6 x per day but less noct/ rarely when sleeping  Not limited by breathing from desired activities    No obvious day to day or daytime variability or assoc excess/ purulent sputum or mucus plugs or hemoptysis or cp or  chest tightness, subjective wheeze or overt sinus or hb symptoms. No unusual exp hx or h/o childhood pna/ asthma or knowledge of premature birth.  Sleeping ok without nocturnal  or early am exacerbation  of respiratory  c/o's or need for noct saba. Also denies any obvious fluctuation of symptoms with weather or environmental changes or other aggravating or alleviating factors except as outlined above   Current Medications, Allergies, Complete Past Medical History, Past Surgical History, Family History, and Social History were reviewed in [employer] [facility] record.  ROS  The following are not active complaints unless bolded sore throat, dysphagia, dental problems, itching, sneezing,  nasal congestion or excess/ purulent secretions, ear ache,   fever, chills, sweats, unintended wt loss, classically pleuritic or exertional cp,  orthopnea pnd or leg swelling, presyncope, palpitations, abdominal pain, anorexia, nausea, vomiting, diarrhea  or change in bowel or bladder habits, change in stools or urine, dysuria,hematuria,  rash, arthralgias, visual complaints, headache, numbness, weakness or ataxia or problems with walking or coordination,  change in mood/affect or memory.                    Objective:   Physical Exam  amb obese bf ? Belle affect/ nl tone to voice   11/11/2016  246   10/13/16 248 lb (112.5 kg)  09/21/16 255 lb 12.8 oz (116 kg)  09/19/16 253 lb 12 oz (115.1 kg)    Vital signs reviewed  - Note on arrival 02 sats  96% on RA and bp up p am meds (did not include valsartan)   HEENT: nl dentition, turbinates, and oropharynx. Nl external ear canals without cough reflex   NECK :  without JVD/Nodes/TM/ nl carotid upstrokes bilaterally   LUNGS: no acc muscle use,  Nl contour chest which is clear to A and P bilaterally without cough on insp or exp maneuvers   CV:  RRR  no s3 or murmur or increase in P2, nad no edema   ABD:  soft and nontender with nl  inspiratory excursion in the supine position. No bruits or organomegaly appreciated, bowel sounds nl  MS:  Nl gait/ ext warm without deformities, calf tenderness, cyanosis or clubbing No obvious joint restrictions   SKIN: warm and dry without lesions    NEURO:  alert, approp, nl sensorium with  no motor or cerebellar deficits apparent.     CXR PA and Lateral:   10/13/2016 :    I personally reviewed images and agree with radiology impression as follows:    No active cardiopulmonary disease.      Assessment & Plan:

## 2016-11-11 NOTE — Assessment & Plan Note (Addendum)
Changed losartan to diovan 11/11/2016 due to upper airway symptoms   For reasons that may related to vascular permability and nitric oxide pathways but not elevated  bradykinin levels (as seen with  ACEi use) losartan in the generic form has been reported now from mulitple sources  to cause a similar pattern of non-specific  upper airway symptoms as seen with acei.   This has not been reported with exposure to the other ARB's to date, so it seems reasonable for now to try either generic diovan  160 mg daily  if ARB needed or use an alternative class altogether.  See:  [name] Allergy Asthma Immunol  2008: 101: p [phone]    Follow up per Primary Care planned

## 2016-11-11 NOTE — Telephone Encounter (Signed)
Med list updated to reflect below changes. [facility] provider VM to make aware of changes.  rx escribed to pharmacy.  lmtcb X1 to make pt aware of changes as well.

## 2016-11-11 NOTE — Assessment & Plan Note (Signed)

## 2016-11-11 NOTE — Assessment & Plan Note (Addendum)
Spirometry 10/13/2016  Including mid flows and curvature while actively symptomatic - exp curves only  - PFT's  11/11/2016   wnl including flow vol loop on insp/exp though ERV 38%  Continues to report Symptoms are markedly disproportionate to objective findings and not clear this is a lung problem but pt does appear to have difficult airway management issues. DDX of  difficult airways management almost all start with A and  include Adherence, Ace Inhibitors, Acid Reflux, Active Sinus Disease, Alpha 1 Antitripsin deficiency, Anxiety masquerading as Airways dz,  ABPA,  Allergy(esp in young), Aspiration (esp in elderly), Adverse effects of meds,  Active smokers, A bunch of PE's (a small clot burden can't cause this syndrome unless there is already severe underlying pulm or vascular dz with poor reserve) plus two Bs  = Bronchiectasis and Beta blocker use..and one C= CHF  Adherence is always the initial "prime suspect" and is a multilayered concern that requires a "trust but verify" approach in every patient - starting with knowing how to use medications, especially inhalers, correctly, keeping up with refills and understanding the fundamental difference between maintenance and prns vs those medications only taken for a very short course and then stopped and not refilled.  - did not start rx for valsartan or ppi as instructed/ reviewed  ? Acid (or non-acid) GERD > always difficult to exclude as up to 75% of pts in some series report no assoc GI/ Heartburn symptoms> rec max (24h)  acid suppression and diet restrictions/ reviewed and instructions given in writing.   Active smoking (see separate a/p)   ? Anxiety > usually at the bottom of this list of usual suspects but should be much higher on this pt's based on H and P > strongly suspect globus here > ent eval next   I had an extended  discussion with the patient reviewing all relevant studies completed to date and  lasting 25 minutes of a 40  Minute summary  final visit on the following issues:   No evidence of airways dz here but needs to complete a trial of gerd rx for vcd and add prn h1 per guidelines and they be sure to do ent f/u next    See sep a/p's individually discussed with pt/ daughter  Each maintenance medication was reviewed in detail including most importantly the difference between maintenance and as needed and under what circumstances the prns are to be used.  Please see AVS for specific  Instructions which are unique to this visit and I personally typed out  which were reviewed in detail in writing with the patient and a copy provided.     No need for pulmonary f/u

## 2016-11-11 NOTE — Assessment & Plan Note (Signed)
Referred to ent 11/11/2016 p added max rx for gerd / 1st H1  As per guidelines

## 2016-11-11 NOTE — Telephone Encounter (Signed)
D/c protonix and use pepcid 20 mg after first and last meals instead of bedtime dosing

## 2016-11-14 ENCOUNTER — Other Ambulatory Visit: Payer: Self-pay

## 2016-11-14 DIAGNOSIS — I10 Essential (primary) hypertension: Secondary | ICD-10-CM

## 2016-11-14 MED ORDER — HYDROCHLOROTHIAZIDE 12.5 MG PO CAPS: 12.5000 mg | ORAL_CAPSULE | Freq: Every day | ORAL | 0 refills | Status: DC

## 2016-11-14 NOTE — Telephone Encounter (Signed)
Pt left v/m requesting refill HCTZ; pt established care on 09/21/16; pt was to return in 2-3 weeks and pt was a no show on 10/03/16; no future appt scheduled.Please advise.

## 2016-11-15 NOTE — Telephone Encounter (Signed)
Message left for patient to return my call.  

## 2016-11-17 MED ORDER — HYDROCHLOROTHIAZIDE 12.5 MG PO CAPS: 12.5000 mg | ORAL_CAPSULE | Freq: Every day | ORAL | 0 refills | Status: DC

## 2016-11-17 NOTE — Addendum Note (Signed)
Addended by: Jacqualin Combes on: 11/17/2016 09:19 AM   Modules accepted: Orders

## 2016-11-17 NOTE — Telephone Encounter (Signed)
Patient called back. Will call back to schedule the appointment. Had to change pharmacy and re-sent medication.

## 2016-11-24 DIAGNOSIS — R221 Localized swelling, mass and lump, neck: Secondary | ICD-10-CM | POA: Insufficient documentation

## 2016-11-24 DIAGNOSIS — K219 Gastro-esophageal reflux disease without esophagitis: Secondary | ICD-10-CM | POA: Insufficient documentation

## 2016-11-25 ENCOUNTER — Other Ambulatory Visit: Payer: Self-pay | Admitting: Internal Medicine

## 2016-11-25 ENCOUNTER — Ambulatory Visit (INDEPENDENT_AMBULATORY_CARE_PROVIDER_SITE_OTHER): Payer: Managed Care, Other (non HMO) | Admitting: Primary Care

## 2016-11-25 ENCOUNTER — Encounter: Payer: Self-pay | Admitting: Primary Care

## 2016-11-25 VITALS — BP 152/102 | HR 75 | Temp 98.0°F | Ht 65.0 in | Wt 243.1 lb

## 2016-11-25 DIAGNOSIS — J019 Acute sinusitis, unspecified: Secondary | ICD-10-CM | POA: Diagnosis not present

## 2016-11-25 DIAGNOSIS — I10 Essential (primary) hypertension: Secondary | ICD-10-CM

## 2016-11-25 MED ORDER — AZITHROMYCIN 250 MG PO TABS: ORAL_TABLET | ORAL | 0 refills | Status: DC

## 2016-11-25 MED ORDER — AMLODIPINE BESYLATE 10 MG PO TABS: 10.0000 mg | ORAL_TABLET | Freq: Every day | ORAL | 0 refills | Status: DC

## 2016-11-25 NOTE — Assessment & Plan Note (Signed)
Experienced dry, tickle, cough with valsartan, she stopped taking 1 week ago with resolve of symptoms. BP above goal today, increase Amlodipine to 10 mg. Will have her follow up in 3 weeks for BP check. Consider another ARB for renal protection against diabetes and for BP if no improvement.

## 2016-11-25 NOTE — Progress Notes (Signed)
Pre visit review using our clinic review tool, if applicable. No additional management support is needed unless otherwise documented below in the visit note. 

## 2016-11-25 NOTE — Telephone Encounter (Signed)
Denied.  Message sent to the pharmacy.  Not a pt of Dr. Regis Bill.  Advised they contact pt to find out new PCP.

## 2016-11-25 NOTE — Progress Notes (Signed)
Subjective:    Patient ID: [name], female    DOB: April 14, 1966, 51 y.o.   MRN: [id number]  HPI  [name] is a 51 year old female who presents today for follow up.  1) Essential Hypertension: Currently managed on Amlodipine 5 mg, HCTZ 12.5 mg, and valsartan 180 mg. She stopped taking valsartan 1 week ago due to throat initiation and a dry, tickle-like cough soon after starting valsartan. This has improved since she stopped taking the valsartan. Her BP in the office today is 152/102. She's checking her BP at home with readings of 150's-180's/90's/100's.   2) Sinus Pressure: Nasal congestion, sinus pressure, and fatigue x 2 weeks. Monday this week she woke up with rhinorrhea and then became permanently congested with increased presssure. She also reports a wet cough that began 1 week ago and chest congestion. She denies sore throat, fevers. She used her Nasonex, Delsym, Alka-Selzer without improvement. She's been around her daughter who's been ill.   Review of Systems  Constitutional: Positive for chills and fatigue. Negative for fever.  HENT: Positive for congestion, sinus pain and sinus pressure. Negative for sore throat.   Eyes: Negative for visual disturbance.  Respiratory: Positive for cough. Negative for shortness of breath and wheezing.   Cardiovascular: Negative for chest pain.  Neurological: Positive for headaches. Negative for dizziness.       Past Medical History:  Diagnosis Date  . Anemia    Normocytic  . ASTHMA 07/24/2006  . Bronchitis 2005  . 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 Gastroenteritis 07/10/07  . GERD 07/24/2006  . GRAVE'S DISEASE 01/01/2008  . History of hidradenitis suppurativa   . History of thrush   . HIV DISEASE 07/24/2006   dx March 05  . HYPERTENSION 07/24/2006  . Hyperthyroidism 08/2006   Grave's Disease -diffuse radiotracer uptake 08/25/06 Thyroid scan-Cold nodule to R lower lobe of thyrorid  . Menometrorrhagia    hx of  .  Nephrolithiasis   . Pneumonia 2005  . 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 Sarcoidosis ([place]) 02/08/2007   dx as a teenager in [place] from abnl CXR. Completed 2 yrs Prednisone after lung bx confirmation. No symptoms since then.  . THYROID NODULE, RIGHT 02/08/2007     Social History   Social History  . Marital status: Single    Spouse name: N/A  . Number of children: 1  . Years of education: N/A   Occupational History  . [employer]   Social History Main Topics  . Smoking status: Current Every Day Smoker    Packs/day: 0.50    Years: 15.00    Types: Cigarettes  . Smokeless tobacco: Never Used  . Alcohol use No  . Drug use: No  . Sexual activity: Not Currently     Comment: declined condoms   Other Topics Concern  . Not on file   Social History Narrative  . No narrative on file    Past Surgical History:  Procedure Laterality Date  . BREAST SURGERY  1997   Breast Reduction   . CYSTOSCOPY W/ URETERAL STENT REMOVAL  11/09/2012   Procedure: CYSTOSCOPY WITH STENT REMOVAL;  Surgeon: Alexis Frock, MD;  Location: [facility];  Service: Urology;  Laterality: Right;  . CYSTOSCOPY WITH RETROGRADE PYELOGRAM, URETEROSCOPY AND STENT PLACEMENT  11/09/2012   Procedure: CYSTOSCOPY WITH RETROGRADE PYELOGRAM, URETEROSCOPY AND STENT PLACEMENT;  Surgeon: Alexis Frock, MD;  Location: [facility];  Service: Urology;  Laterality: Left;  LEFT URETEROSCOPY, STONE MANIPULATION, left STENT exchange   .  CYSTOSCOPY WITH STENT PLACEMENT  10/02/2012   Procedure: CYSTOSCOPY WITH STENT PLACEMENT;  Surgeon: Alexis Frock, MD;  Location: [facility];  Service: Urology;  Laterality: Left;  . DILATION AND CURETTAGE OF UTERUS  Feb 2004   s/p for 1st trimester nonviable pregnancy  . EYE SURGERY     sty under eyelid  . INCISE AND DRAIN ABCESS  Nov 03   s/p I &D for righ inframmary fold hidradenitis  . INCISION AND DRAINAGE PERITONSILLAR ABSCESS  Mar 03  . IRRIGATION AND DEBRIDEMENT ABSCESS   01/31/2012   Procedure: IRRIGATION AND DEBRIDEMENT ABSCESS;  Surgeon: Shann Medal, MD;  Location: [facility];  Service: General;  Laterality: Right;  right breast and axilla   . NEPHROLITHOTOMY  10/02/2012   Procedure: NEPHROLITHOTOMY PERCUTANEOUS;  Surgeon: Alexis Frock, MD;  Location: [facility];  Service: Urology;  Laterality: Right;  First Stage Percutaneous Nephrolithotomy with Surgeon Access, Left Ureteral Stent    . NEPHROLITHOTOMY  10/04/2012   Procedure: NEPHROLITHOTOMY PERCUTANEOUS SECOND LOOK;  Surgeon: Alexis Frock, MD;  Location: [facility];  Service: Urology;  Laterality: Right;     . NEPHROLITHOTOMY  10/08/2012   Procedure: NEPHROLITHOTOMY PERCUTANEOUS;  Surgeon: Alexis Frock, MD;  Location: [facility];  Service: Urology;  Laterality: Right;  THIRD STAGE, nephrostomy tube exchange x 2  . NEPHROLITHOTOMY  10/11/2012   Procedure: NEPHROLITHOTOMY PERCUTANEOUS SECOND LOOK;  Surgeon: Alexis Frock, MD;  Location: [facility];  Service: Urology;  Laterality: Right;  RIGHT 4 STAGE PERCUTANOUS NEPHROLITHOTOMY, right URETEROSCOPY WITH HOLMIUM LASER   . [place]  1994  . TOTAL THYROIDECTOMY  2010    Family History  Problem Relation Age of Onset  . Hypertension Mother   . Cancer Mother     throat  . Hypertension Father   . Cancer Other     FH of Breast Cancer  . Cancer Other   . Heart disease Other   . Hypertension Other   . Stroke Other     Grandparent  . Kidney disease Other     Grandparent  . Diabetes Other     FH of Diabetes  . Hypertension Sister   . Cancer Maternal Uncle     Stomach Cancer, Lung CA    Allergies  Allergen Reactions  . Tape Rash    Rash   . Genvoya [Elviteg-Cobic-Emtricit-Tenofaf] Hives  . Lisinopril Cough  . Xylocaine [Lidocaine]     Unknown , patient states she has had lidocaine for IV starts and at the dentist with no problems  . Valsartan Cough    Current Outpatient Prescriptions on File Prior to Visit  Medication Sig Dispense Refill  . albuterol  (PROVENTIL HFA;VENTOLIN HFA) 108 (90 Base) MCG/ACT inhaler Inhale 2 puffs into the lungs every 6 (six) hours as needed for wheezing or shortness of breath. 1 Inhaler 5  . glucose blood (ONETOUCH VERIO) test strip USE TO CHECK BLOOD SUGAR DAILY AND PRN 100 each 4  . hydrochlorothiazide (MICROZIDE) 12.5 MG capsule Take 1 capsule (12.5 mg total) by mouth daily. 30 capsule 0  . levothyroxine (SYNTHROID, LEVOTHROID) 200 MCG tablet TAKE 1 TABLET (200 MCG TOTAL) BY MOUTH DAILY. 30 tablet 5  . metFORMIN (GLUCOPHAGE) 1000 MG tablet Take 1 tablet (1,000 mg total) by mouth 2 (two) times daily with a meal. 180 tablet 3  . mometasone (NASONEX) 50 MCG/ACT nasal spray Place 2 sprays into the nose daily. 17 g 3  . Multiple Vitamin (MULTIVITAMIN) tablet Take 1 tablet by mouth daily.    Marland Kitchen  ODEFSEY 200-25-25 MG TABS tablet TAKE 1 TABLET BY MOUTH DAILY. 30 tablet 2  . ONE TOUCH LANCETS MISC USE TO CHECK BLOOD SUGAR DAILY AND PRN 100 each 4   No current facility-administered medications on file prior to visit.     BP (!) 152/102   Pulse 75   Temp 98 F (36.7 C) (Oral)   Ht 5\' 5"  (1.651 m)   Wt 243 lb 1.9 oz (110.3 kg)   SpO2 97%   BMI 40.46 kg/m    Objective:   Physical Exam  Constitutional: She appears well-nourished. She appears ill.  HENT:  Right Ear: Tympanic membrane and ear canal normal.  Left Ear: Tympanic membrane and ear canal normal.  Nose: Mucosal edema present. Right sinus exhibits maxillary sinus tenderness. Right sinus exhibits no frontal sinus tenderness. Left sinus exhibits maxillary sinus tenderness. Left sinus exhibits no frontal sinus tenderness.  Mouth/Throat: Oropharynx is clear and moist.  Eyes: Conjunctivae are normal.  Neck: Neck supple.  Cardiovascular: Normal rate and regular rhythm.   Pulmonary/Chest: Effort normal. She has no wheezes. She has rhonchi in the right lower field. She has no rales.  Lymphadenopathy:    She has no cervical adenopathy.  Skin: Skin is warm and  dry.          Assessment & Plan:  Acute Sinusitis:  Sinus pressure, nasal congestion x 2 weeks. Overall feeling worse with expulsion of thick brown/yellow mucous from nasal cavity.  Exam and HPI today suspicious for bacterial involvement.  Given duration of symptoms coupled with presentation, will treat. Rx for Zpak sent to pharmacy. Continue Nasonex, fluids, rest. Follow up PRN.  Sheral Flow, NP

## 2016-11-25 NOTE — Patient Instructions (Addendum)
We've increased the dose of your Amlodipine from 5 mg to 10 mg. You may take two of the 5 mg tablets until your bottle is empty. Only take one of the 10 mg tablets once you pick them up.  Continue to monitor your blood pressure, record your readings, and bring them to your next appointment.  Start Azithromycin antibiotics for sinus infection. Take 2 tablets by mouth today, then 1 tablet daily for 4 additional days.  Continue Nasonex daily to help reduce congestion and pressure.  Follow up in 3 weeks for re-evaluation of blood pressure.  It was a pleasure to see you today!

## 2016-11-28 ENCOUNTER — Other Ambulatory Visit: Payer: Self-pay | Admitting: Otolaryngology

## 2016-11-28 DIAGNOSIS — R221 Localized swelling, mass and lump, neck: Secondary | ICD-10-CM

## 2016-12-08 ENCOUNTER — Other Ambulatory Visit: Payer: Self-pay | Admitting: Internal Medicine

## 2016-12-08 ENCOUNTER — Other Ambulatory Visit: Payer: Self-pay | Admitting: Primary Care

## 2016-12-08 DIAGNOSIS — B20 Human immunodeficiency virus [HIV] disease: Secondary | ICD-10-CM

## 2016-12-09 ENCOUNTER — Ambulatory Visit
Admission: RE | Admit: 2016-12-09 | Discharge: 2016-12-09 | Disposition: A | Discharge status: Home / Self Care | Payer: Managed Care, Other (non HMO) | Source: Ambulatory Visit | Attending: Otolaryngology | Admitting: Otolaryngology

## 2016-12-09 DIAGNOSIS — R221 Localized swelling, mass and lump, neck: Secondary | ICD-10-CM

## 2016-12-09 MED ORDER — IOPAMIDOL (ISOVUE-300) INJECTION 61%
75.0000 mL | Freq: Once | INTRAVENOUS | Status: AC | PRN
  Administered 2016-12-09: 75 mL via INTRAVENOUS

## 2016-12-22 ENCOUNTER — Other Ambulatory Visit: Payer: Self-pay | Admitting: Internal Medicine

## 2017-01-03 ENCOUNTER — Other Ambulatory Visit (HOSPITAL_COMMUNITY): Payer: Self-pay | Admitting: Otolaryngology

## 2017-01-03 DIAGNOSIS — R590 Localized enlarged lymph nodes: Secondary | ICD-10-CM

## 2017-01-10 ENCOUNTER — Other Ambulatory Visit: Payer: Self-pay | Admitting: Radiology

## 2017-01-11 ENCOUNTER — Other Ambulatory Visit: Payer: Self-pay | Admitting: General Surgery

## 2017-01-12 ENCOUNTER — Encounter (HOSPITAL_COMMUNITY): Payer: Self-pay

## 2017-01-12 ENCOUNTER — Other Ambulatory Visit (HOSPITAL_COMMUNITY): Payer: Self-pay | Admitting: Otolaryngology

## 2017-01-12 ENCOUNTER — Other Ambulatory Visit: Payer: Self-pay | Admitting: Primary Care

## 2017-01-12 ENCOUNTER — Ambulatory Visit (HOSPITAL_COMMUNITY)
Admission: RE | Admit: 2017-01-12 | Discharge: 2017-01-12 | Disposition: A | Discharge status: Home / Self Care | Payer: Managed Care, Other (non HMO) | Source: Ambulatory Visit | Attending: Otolaryngology | Admitting: Otolaryngology

## 2017-01-12 DIAGNOSIS — E890 Postprocedural hypothyroidism: Secondary | ICD-10-CM | POA: Insufficient documentation

## 2017-01-12 DIAGNOSIS — K219 Gastro-esophageal reflux disease without esophagitis: Secondary | ICD-10-CM | POA: Diagnosis not present

## 2017-01-12 DIAGNOSIS — R590 Localized enlarged lymph nodes: Secondary | ICD-10-CM

## 2017-01-12 DIAGNOSIS — R05 Cough: Secondary | ICD-10-CM | POA: Diagnosis not present

## 2017-01-12 DIAGNOSIS — D869 Sarcoidosis, unspecified: Secondary | ICD-10-CM | POA: Diagnosis not present

## 2017-01-12 DIAGNOSIS — F17210 Nicotine dependence, cigarettes, uncomplicated: Secondary | ICD-10-CM | POA: Insufficient documentation

## 2017-01-12 DIAGNOSIS — Z7984 Long term (current) use of oral hypoglycemic drugs: Secondary | ICD-10-CM | POA: Insufficient documentation

## 2017-01-12 DIAGNOSIS — Z8249 Family history of ischemic heart disease and other diseases of the circulatory system: Secondary | ICD-10-CM | POA: Insufficient documentation

## 2017-01-12 DIAGNOSIS — Z823 Family history of stroke: Secondary | ICD-10-CM | POA: Insufficient documentation

## 2017-01-12 DIAGNOSIS — Z79899 Other long term (current) drug therapy: Secondary | ICD-10-CM | POA: Diagnosis not present

## 2017-01-12 DIAGNOSIS — J45909 Unspecified asthma, uncomplicated: Secondary | ICD-10-CM | POA: Diagnosis not present

## 2017-01-12 DIAGNOSIS — Z888 Allergy status to other drugs, medicaments and biological substances status: Secondary | ICD-10-CM | POA: Diagnosis not present

## 2017-01-12 DIAGNOSIS — I10 Essential (primary) hypertension: Secondary | ICD-10-CM

## 2017-01-12 DIAGNOSIS — Z9889 Other specified postprocedural states: Secondary | ICD-10-CM | POA: Insufficient documentation

## 2017-01-12 DIAGNOSIS — E0500 Thyrotoxicosis with diffuse goiter without thyrotoxic crisis or storm: Secondary | ICD-10-CM | POA: Diagnosis not present

## 2017-01-12 DIAGNOSIS — Z833 Family history of diabetes mellitus: Secondary | ICD-10-CM | POA: Insufficient documentation

## 2017-01-12 LAB — CBC
HCT: 38.1 % (ref 36.0–46.0)
HEMOGLOBIN: 12.7 g/dL (ref 12.0–15.0)
MCH: 28.2 pg (ref 26.0–34.0)
MCHC: 33.3 g/dL (ref 30.0–36.0)
MCV: 84.5 fL (ref 78.0–100.0)
Platelets: 385 10*3/uL (ref 150–400)
RBC: 4.51 MIL/uL (ref 3.87–5.11)
RDW: 16.6 % — ABNORMAL HIGH (ref 11.5–15.5)
WBC: 8.8 10*3/uL (ref 4.0–10.5)

## 2017-01-12 LAB — GLUCOSE, CAPILLARY: Glucose-Capillary: 98 mg/dL (ref 65–99)

## 2017-01-12 MED ORDER — MIDAZOLAM HCL 2 MG/2ML IJ SOLN
INTRAMUSCULAR | Status: AC
  Filled 2017-01-12: qty 2

## 2017-01-12 MED ORDER — MIDAZOLAM HCL 5 MG/5ML IJ SOLN
INTRAMUSCULAR | Status: AC | PRN
  Administered 2017-01-12: 1 mg via INTRAVENOUS

## 2017-01-12 MED ORDER — FENTANYL CITRATE (PF) 100 MCG/2ML IJ SOLN
INTRAMUSCULAR | Status: AC
  Filled 2017-01-12: qty 2

## 2017-01-12 MED ORDER — SODIUM CHLORIDE 0.9 % IV SOLN: INTRAVENOUS | Status: DC

## 2017-01-12 MED ORDER — SODIUM CHLORIDE 0.9 % IV SOLN
INTRAVENOUS | Status: AC | PRN
  Administered 2017-01-12: 10 mL/h via INTRAVENOUS

## 2017-01-12 MED ORDER — FENTANYL CITRATE (PF) 100 MCG/2ML IJ SOLN
INTRAMUSCULAR | Status: AC | PRN
  Administered 2017-01-12: 50 ug via INTRAVENOUS

## 2017-01-12 MED ORDER — MIDAZOLAM HCL 2 MG/2ML IJ SOLN: INTRAMUSCULAR | Status: AC | PRN

## 2017-01-12 MED ORDER — LIDOCAINE HCL 1 % IJ SOLN
INTRAMUSCULAR | Status: AC
  Filled 2017-01-12: qty 20

## 2017-01-12 NOTE — Discharge Instructions (Signed)
Needle Biopsy, Care After °These instructions give you information about caring for yourself after your procedure. Your doctor may also give you more specific instructions. Call your doctor if you have any problems or questions after your procedure. °Follow these instructions at home: °· Rest as told by your doctor. °· Take medicines only as told by your doctor. °· There are many different ways to close and cover the biopsy site, including stitches (sutures), skin glue, and adhesive strips. Follow instructions from your doctor about: °¨ How to take care of your biopsy site. °¨ When and how you should change your bandage (dressing). °¨ When you should remove your dressing. °¨ Removing whatever was used to close your biopsy site. °· Check your biopsy site every day for signs of infection. Watch for: °¨ Redness, swelling, or pain. °¨ Fluid, blood, or pus. °Contact a doctor if: °· You have a fever. °· You have redness, swelling, or pain at the biopsy site, and it lasts longer than a few days. °· You have fluid, blood, or pus coming from the biopsy site. °· You feel sick to your stomach (nauseous). °· You throw up (vomit). °Get help right away if: °· You are short of breath. °· You have trouble breathing. °· Your chest hurts. °· You feel dizzy or you pass out (faint). °· You have bleeding that does not stop with pressure or a bandage. °· You cough up blood. °· Your belly (abdomen) hurts. °This information is not intended to replace advice given to you by your health care provider. Make sure you discuss any questions you have with your health care provider. °Document Released: 09/15/2008 Document Revised: 03/10/2016 Document Reviewed: 09/29/2014 °Elsevier Interactive Patient Education © 2017 [employer] ° °

## 2017-01-12 NOTE — H&P (Signed)
Chief Complaint: Patient was seen in consultation today for left neck mass biopsy and cervical lymph node biopsy at the request of Bates,Dwight  Referring Physician(s): [place]  Supervising Physician: Corrie Mckusick  Patient Status: [facility] - Out-pt  History of Present Illness: [name] is a 51 y.o. female   Pt has hx Graves disease Complete thyroidectomy 2008 Now has had persistent cough and breathing difficulty x several weeks MD felt Left neck mass and sent pt for [place] 12/09/2016: IMPRESSION: 1. Multiple enlarged bilateral cervical lymph nodes. Some of these nodes may be reactive given prominent lymphoid tissue in the nasopharynx and oropharynx. This could be related to the an upper respiratory infection. Alternatively, lymphoproliferative neoplasm should also be considered. Ultrasound-guided biopsy could be used for further evaluation. 2. Residual 10 mm nodule at the thyroidectomy bed with similar high-density nodules superior to the thyroid on the left. This is concerning for residual or recurrent thyroid tissue. Neoplasm is not excluded. Recommend ultrasound-guided biopsy. 3. Prominent lymphoid tissue in the nasopharynx and oropharynx without a discrete mass. 4. Single low-density noted in the posterior left level 2 station of undetermined significance.  Request for left neck mass biopsy and cervical lymph node biopsy  Past Medical History:  Diagnosis Date  . Anemia    Normocytic  . ASTHMA 07/24/2006  . Bronchitis 2005  . 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 Gastroenteritis 07/10/07  . GERD 07/24/2006  . GRAVE'S DISEASE 01/01/2008  . History of hidradenitis suppurativa   . History of thrush   . HIV DISEASE 07/24/2006   dx March 05  . HYPERTENSION 07/24/2006  . Hyperthyroidism 08/2006   Grave's Disease -diffuse radiotracer uptake 08/25/06 Thyroid scan-Cold nodule to R lower lobe of thyrorid  . Menometrorrhagia    hx of  . Nephrolithiasis   .  Pneumonia 2005  . 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 Sarcoidosis ([place]) 02/08/2007   dx as a teenager in [place] from abnl CXR. Completed 2 yrs Prednisone after lung bx confirmation. No symptoms since then.  . THYROID NODULE, RIGHT 02/08/2007    Past Surgical History:  Procedure Laterality Date  . BREAST SURGERY  1997   Breast Reduction   . CYSTOSCOPY W/ URETERAL STENT REMOVAL  11/09/2012   Procedure: CYSTOSCOPY WITH STENT REMOVAL;  Surgeon: Alexis Frock, MD;  [facility]: [facility];  Service: Urology;  Laterality: Right;  . CYSTOSCOPY WITH RETROGRADE PYELOGRAM, URETEROSCOPY AND STENT PLACEMENT  11/09/2012   Procedure: CYSTOSCOPY WITH RETROGRADE PYELOGRAM, URETEROSCOPY AND STENT PLACEMENT;  Surgeon: Alexis Frock, MD;  [facility];  Service: Urology;  Laterality: Left;  LEFT URETEROSCOPY, STONE MANIPULATION, left STENT exchange   . CYSTOSCOPY WITH STENT PLACEMENT  10/02/2012   Procedure: CYSTOSCOPY WITH STENT PLACEMENT;  Surgeon: Alexis Frock, MD;  [facility];  Service: Urology;  Laterality: Left;  . DILATION AND CURETTAGE OF UTERUS  Feb 2004   s/p for 1st trimester nonviable pregnancy  . EYE SURGERY     sty under eyelid  . INCISE AND DRAIN ABCESS  Nov 03   s/p I &D for righ inframmary fold hidradenitis  . INCISION AND DRAINAGE PERITONSILLAR ABSCESS  Mar 03  . IRRIGATION AND DEBRIDEMENT ABSCESS  01/31/2012   Procedure: IRRIGATION AND DEBRIDEMENT ABSCESS;  Surgeon: Shann Medal, MD;  [facility];  Service: General;  Laterality: Right;  right breast and axilla   . NEPHROLITHOTOMY  10/02/2012   Procedure: NEPHROLITHOTOMY PERCUTANEOUS;  Surgeon: Alexis Frock, MD;  [facility];  Service: Urology;  Laterality: Right;  First Stage Percutaneous Nephrolithotomy with Surgeon Access, Left Ureteral Stent    . NEPHROLITHOTOMY  10/04/2012   Procedure: NEPHROLITHOTOMY PERCUTANEOUS SECOND LOOK;  Surgeon: Alexis Frock, MD;  [facility];  Service: Urology;  Laterality:  Right;     . NEPHROLITHOTOMY  10/08/2012   Procedure: NEPHROLITHOTOMY PERCUTANEOUS;  Surgeon: Alexis Frock, MD;  [facility];  Service: Urology;  Laterality: Right;  THIRD STAGE, nephrostomy tube exchange x 2  . NEPHROLITHOTOMY  10/11/2012   Procedure: NEPHROLITHOTOMY PERCUTANEOUS SECOND LOOK;  Surgeon: Alexis Frock, MD;  [facility];  Service: Urology;  Laterality: Right;  RIGHT 4 STAGE PERCUTANOUS NEPHROLITHOTOMY, right URETEROSCOPY WITH HOLMIUM LASER   . [place]  1994  . TOTAL THYROIDECTOMY  2010    Allergies: Tape; Genvoya [elviteg-cobic-emtricit-tenofaf]; Lisinopril; Xylocaine [lidocaine]; and Valsartan  Medications: Prior to Admission medications   Medication Sig Start Date End Date Taking? Authorizing Provider  albuterol (PROVENTIL HFA;VENTOLIN HFA) 108 (90 Base) MCG/ACT inhaler Inhale 2 puffs into the lungs every 6 (six) hours as needed for wheezing or shortness of breath. 09/21/16  Yes [name], NP  amLODipine (NORVASC) 10 MG tablet Take 1 tablet (10 mg total) by mouth daily. 11/25/16  Yes [name], NP  glucose blood [facility] VERIO) test strip USE TO CHECK BLOOD SUGAR DAILY AND PRN 06/25/15  Yes Kennyth Arnold, FNP  hydrochlorothiazide (MICROZIDE) 12.5 MG capsule Take 1 capsule (12.5 mg total) by mouth daily. 11/17/16  Yes [name], NP  levothyroxine (SYNTHROID, LEVOTHROID) 200 MCG tablet TAKE 1 TABLET (200 MCG TOTAL) BY MOUTH DAILY. 12/09/16  Yes [name], NP  metFORMIN (GLUCOPHAGE) 500 MG tablet TAKE 1 TABLET (500 MG TOTAL) BY MOUTH 2 (TWO) TIMES DAILY WITH A MEAL. 12/08/16  Yes Michel Bickers, MD  mometasone (NASONEX) 50 MCG/ACT nasal spray Place 2 sprays into the nose daily. 02/18/16  Yes Betty G Martinique, MD  Multiple Vitamin (MULTIVITAMIN) tablet Take 1 tablet by mouth daily.   Yes Historical Provider, MD  ODEFSEY 200-25-25 MG TABS tablet TAKE 1 TABLET BY MOUTH DAILY. 12/08/16  Yes Michel Bickers, MD  ONE TOUCH LANCETS MISC USE TO CHECK BLOOD  SUGAR DAILY AND PRN 06/25/15  Yes Kennyth Arnold, FNP  pantoprazole (PROTONIX) 40 MG tablet Take 40 mg by mouth daily.  11/24/16   Historical Provider, MD     Family History  Problem Relation Age of Onset  . Hypertension Mother   . Cancer Mother     throat  . Hypertension Father   . Cancer Other     FH of Breast Cancer  . Cancer Other   . Heart disease Other   . Hypertension Other   . Stroke Other     Grandparent  . Kidney disease Other     Grandparent  . Diabetes Other     FH of Diabetes  . Hypertension Sister   . Cancer Maternal Uncle     Stomach Cancer, Lung CA    Social History   Social History  . Marital status: Single    Spouse name: N/A  . Number of children: 1  . Years of education: N/A   Occupational History  . [place]   Social History Main Topics  . Smoking status: Current Every Day Smoker    Packs/day: 0.50    Years: 15.00    Types: Cigarettes  . Smokeless tobacco: Never Used  . Alcohol use No  . Drug use: No  .  Sexual activity: Not Currently     Comment: declined condoms   Other Topics Concern  . None   Social History Narrative  . None    Review of Systems: A 12 point ROS discussed and pertinent positives are indicated in the HPI above.  All other systems are negative.  Review of Systems  Constitutional: Negative for activity change, appetite change, fatigue and fever.  Respiratory: Positive for cough and shortness of breath.   Gastrointestinal: Negative for abdominal pain.  Psychiatric/Behavioral: Negative for behavioral problems and confusion.    Vital Signs: LMP 03/31/2014   Physical Exam  Constitutional: She is oriented to person, place, and time. She appears well-nourished.  Neck: Neck supple.  Cardiovascular: Normal rate, regular rhythm and normal heart sounds.   Pulmonary/Chest: Effort normal and breath sounds normal.  Abdominal: Soft. Bowel sounds are normal.  Musculoskeletal: Normal range of motion.    Neurological: She is alert and oriented to person, place, and time.  Skin: Skin is warm and dry.  Psychiatric: She has a normal mood and affect. Her behavior is normal. Judgment and thought content normal.  Nursing note and vitals reviewed.   Mallampati Score:  MD Evaluation Airway: WNL Heart: WNL Abdomen: WNL Chest/ Lungs: WNL ASA  Classification: 2 Mallampati/Airway Score: One  Imaging: No results found.  Labs:  CBC: No results for input(s): WBC, HGB, HCT, PLT in the last 8760 hours.  COAGS: No results for input(s): INR, APTT in the last 8760 hours.  BMP: No results for input(s): NA, K, CL, CO2, GLUCOSE, BUN, CALCIUM, CREATININE, GFRNONAA, GFRAA in the last 8760 hours.  Invalid input(s): CMP  LIVER FUNCTION TESTS: No results for input(s): BILITOT, AST, ALT, ALKPHOS, PROT, ALBUMIN in the last 8760 hours.  TUMOR MARKERS: No results for input(s): AFPTM, CEA, CA199, CHROMGRNA in the last 8760 hours.  Assessment and Plan:  Persistent cough Left neck mass (post thyroidectomy 2008) Cervical lymphadenopathy Now scheduled for biopsy of mass and lymph node Risks and Benefits discussed with the patient including, but not limited to bleeding, infection, damage to adjacent structures or low yield requiring additional tests. All of the patient's questions were answered, patient is agreeable to proceed. Consent signed and in chart.  Thank you for this interesting consult.  I greatly enjoyed meeting [name] and look forward to participating in their care.  A copy of this report was sent to the requesting provider on this date.  Electronically Signed: Monia Sabal A 01/12/2017, 12:29 PM   I spent a total of  30 Minutes   in face to face in clinical consultation, greater than 50% of which was counseling/coordinating care for left neck mass and LAN bx

## 2017-01-12 NOTE — Sedation Documentation (Signed)
Patient denies pain and is resting comfortably.  

## 2017-01-12 NOTE — Sedation Documentation (Signed)
Dr Earleen Newport aware PT, PTT is hemolyzed. He would like to go ahead with the procedure without the results.

## 2017-01-12 NOTE — Sedation Documentation (Signed)
Patient is resting comfortably. 

## 2017-01-12 NOTE — Sedation Documentation (Addendum)
Patient is resting comfortably. 

## 2017-01-12 NOTE — Procedures (Signed)
Interventional Radiology Procedure Note  Procedure: US guided FNA of left thyroid bed nodule, and of left cervical lymph node.  Complications: None Recommendations:  - Ok to shower tomorrow - Do not submerge for 7 days - Routine care   Signed,  Dulcy Fanny. Earleen Newport, DO

## 2017-01-16 ENCOUNTER — Telehealth: Payer: Self-pay | Admitting: Primary Care

## 2017-01-16 NOTE — Telephone Encounter (Signed)
Patient Name: [name]  DOB: 1966/08/23    Initial Comment Caller says, blood pressure elevated since Thurs, 153/132 now, with a headache. Over the weekend she had levels around 180/104. -- she called the appt line    Nurse Assessment  Nurse: Raphael Gibney, RN, Vanita Ingles Date/Time Eilene Ghazi Time): 01/16/2017 12:49:46 PM  Confirm and document reason for call. If symptomatic, describe symptoms. ---Caller states her BP has been elevated since Thursday. BP 180/104 over the weekend. BP 153/132. She was lightheaded with a headache this am. Tylenol helps the headache.  Does the patient have any new or worsening symptoms? ---Yes  Will a triage be completed? ---Yes  Related visit to physician within the last 2 weeks? ---No  Does the PT have any chronic conditions? (i.e. diabetes, asthma, etc.) ---Yes  List chronic conditions. ---HTN  Is the patient pregnant or possibly pregnant? (Ask all females between the ages of 35-55) ---No  Is this a behavioral health or substance abuse call? ---No     Guidelines    Guideline Title Affirmed Question Affirmed Notes  High Blood Pressure [1] Taking BP medications AND [2] feels is having side effects (e.g., impotence, cough, dizzy upon standing)    Final Disposition User   See Physician within 4 Hours (or PCP triage) Raphael Gibney, RN, [place]    Comments  triage outcome upgraded to see physician within 4 hrs as she has a headache and was unable to go to work today. appt originally scheduled for $/01/2017 at 8:45 am before she said her headache was severe. She does not want me to cancel the appt Please call pt back regarding appt this afternoon.   Referrals  GO TO FACILITY REFUSED   Disagree/Comply: Disagree  Disagree/Comply Reason: Disagree with instructions

## 2017-01-16 NOTE — Telephone Encounter (Signed)
What's her BP running now? How's her headache? Is she taking Amlodipine 10 mg and HCTZ 21.5 mg? Have her continue to monitor BP and bring readings with her to her visit.

## 2017-01-17 NOTE — Telephone Encounter (Signed)
Message left for patient to return my call on 01/16/2017

## 2017-01-18 ENCOUNTER — Ambulatory Visit (INDEPENDENT_AMBULATORY_CARE_PROVIDER_SITE_OTHER): Payer: Managed Care, Other (non HMO) | Admitting: Primary Care

## 2017-01-18 ENCOUNTER — Encounter: Payer: Self-pay | Admitting: Primary Care

## 2017-01-18 VITALS — BP 152/98 | HR 69 | Temp 98.4°F | Ht 65.0 in | Wt 245.8 lb

## 2017-01-18 DIAGNOSIS — R809 Proteinuria, unspecified: Secondary | ICD-10-CM | POA: Diagnosis not present

## 2017-01-18 DIAGNOSIS — I10 Essential (primary) hypertension: Secondary | ICD-10-CM | POA: Diagnosis not present

## 2017-01-18 DIAGNOSIS — E039 Hypothyroidism, unspecified: Secondary | ICD-10-CM | POA: Diagnosis not present

## 2017-01-18 DIAGNOSIS — E1129 Type 2 diabetes mellitus with other diabetic kidney complication: Secondary | ICD-10-CM

## 2017-01-18 LAB — COMPREHENSIVE METABOLIC PANEL
ALBUMIN: 3.8 g/dL (ref 3.5–5.2)
ALT: 22 U/L (ref 0–35)
AST: 17 U/L (ref 0–37)
Alkaline Phosphatase: 80 U/L (ref 39–117)
BILIRUBIN TOTAL: 0.3 mg/dL (ref 0.2–1.2)
BUN: 19 mg/dL (ref 6–23)
CHLORIDE: 104 meq/L (ref 96–112)
CO2: 28 mEq/L (ref 19–32)
CREATININE: 1.11 mg/dL (ref 0.40–1.20)
Calcium: 9.6 mg/dL (ref 8.4–10.5)
GFR: 67.28 mL/min (ref >60.00)
Glucose, Bld: 131 mg/dL — ABNORMAL HIGH (ref 70–99)
Potassium: 3.3 mEq/L — ABNORMAL LOW (ref 3.5–5.1)
SODIUM: 138 meq/L (ref 135–145)
TOTAL PROTEIN: 7.7 g/dL (ref 6.0–8.3)

## 2017-01-18 LAB — TSH: TSH: 1.59 u[IU]/mL (ref 0.35–4.50)

## 2017-01-18 LAB — HEMOGLOBIN A1C: Hgb A1c MFr Bld: 6.6 % — ABNORMAL HIGH (ref 4.6–6.5)

## 2017-01-18 MED ORDER — METOPROLOL TARTRATE 25 MG PO TABS: 25.0000 mg | ORAL_TABLET | Freq: Two times a day (BID) | ORAL | 1 refills | Status: DC

## 2017-01-18 NOTE — Progress Notes (Signed)
Subjective:    Patient ID: [name], female    DOB: [birth year], 51 y.o.   MRN: [id number]  HPI  [name] is a 51 year old female with a history of hypertension who presents today with a chief complaint of hypertension and headache. She is currently managed on Amlodipine 10 mg and HCTZ 12.5 mg. She called into the triage system reporting home BP readings of 153/102 and 189/102 yesterday. She's had several other elevated readings weeks prior around the same levels. Her BP in the office today is 152/96.   She is complaint to her HCTZ and Amlodipine. She's been taking two tablets of Amlodipine for the past two months. She was once managed on Lisinopril, losartan, valsartan but these were discontinued due to persistent cough and throat tightness.   She's been taking Aspirin 81 mg as she's noticed intermittent heart fluttering for the past several weeks. She's also feeling tired and has a slight headache. She is working on cutting back on her cigarette smoking and is ready to start nicotine patches. She denies chest pain, shortness of breath.  Review of Systems  Constitutional: Positive for fatigue.  Respiratory: Negative for shortness of breath.   Cardiovascular: Positive for palpitations. Negative for chest pain and leg swelling.  Gastrointestinal: Negative for nausea.  Neurological: Positive for headaches. Negative for dizziness.       Past Medical History:  Diagnosis Date  . Anemia    Normocytic  . ASTHMA 07/24/2006  . Bronchitis 2005  . 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 Gastroenteritis 07/10/07  . GERD 07/24/2006  . GRAVE'S DISEASE 01/01/2008  . History of hidradenitis suppurativa   . History of thrush   . HIV DISEASE 07/24/2006   dx March 05  . HYPERTENSION 07/24/2006  . Hyperthyroidism 08/2006   Grave's Disease -diffuse radiotracer uptake 08/25/06 Thyroid scan-Cold nodule to R lower lobe of thyrorid  . Menometrorrhagia    hx of  . Nephrolithiasis   . Pneumonia  2005  . 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 Sarcoidosis ([place]) 02/08/2007   dx as a teenager in [place] from abnl CXR. Completed 2 yrs Prednisone after lung bx confirmation. No symptoms since then.  . THYROID NODULE, RIGHT 02/08/2007     Social History   Social History  . Marital status: Single    Spouse name: N/A  . Number of children: 1  . Years of education: N/A   Occupational History  . [place]   Social History Main Topics  . Smoking status: Current Every Day Smoker    Packs/day: 0.50    Years: 15.00    Types: Cigarettes  . Smokeless tobacco: Never Used  . Alcohol use No  . Drug use: No  . Sexual activity: Not Currently     Comment: declined condoms   Other Topics Concern  . Not on file   Social History Narrative  . No narrative on file    Past Surgical History:  Procedure Laterality Date  . BREAST SURGERY  1997   Breast Reduction   . CYSTOSCOPY W/ URETERAL STENT REMOVAL  11/09/2012   Procedure: CYSTOSCOPY WITH STENT REMOVAL;  Surgeon: Felicia Frock, MD;  [facility];  Service: Urology;  Laterality: Right;  . CYSTOSCOPY WITH RETROGRADE PYELOGRAM, URETEROSCOPY AND STENT PLACEMENT  11/09/2012   Procedure: CYSTOSCOPY WITH RETROGRADE PYELOGRAM, URETEROSCOPY AND STENT PLACEMENT;  Surgeon: Felicia Frock, MD;  [facility];  Service: Urology;  Laterality: Left;  LEFT URETEROSCOPY, STONE MANIPULATION, left STENT exchange   .  CYSTOSCOPY WITH STENT PLACEMENT  10/02/2012   Procedure: CYSTOSCOPY WITH STENT PLACEMENT;  Surgeon: Felicia Frock, MD;  [facility];  Service: Urology;  Laterality: Left;  . DILATION AND CURETTAGE OF UTERUS  Feb 2004   s/p for 1st trimester nonviable pregnancy  . EYE SURGERY     sty under eyelid  . INCISE AND DRAIN ABCESS  Nov 03   s/p I &D for righ inframmary fold hidradenitis  . INCISION AND DRAINAGE PERITONSILLAR ABSCESS  Mar 03  . IRRIGATION AND DEBRIDEMENT ABSCESS  01/31/2012   Procedure: IRRIGATION  AND DEBRIDEMENT ABSCESS;  Surgeon: Felicia Medal, MD;  [facility];  Service: General;  Laterality: Right;  right breast and axilla   . NEPHROLITHOTOMY  10/02/2012   Procedure: NEPHROLITHOTOMY PERCUTANEOUS;  Surgeon: Felicia Frock, MD;  [facility];  Service: Urology;  Laterality: Right;  First Stage Percutaneous Nephrolithotomy with Surgeon Access, Left Ureteral Stent    . NEPHROLITHOTOMY  10/04/2012   Procedure: NEPHROLITHOTOMY PERCUTANEOUS SECOND LOOK;  Surgeon: Felicia Frock, MD;  [facility];  Service: Urology;  Laterality: Right;     . NEPHROLITHOTOMY  10/08/2012   Procedure: NEPHROLITHOTOMY PERCUTANEOUS;  Surgeon: Felicia Frock, MD;  [facility];  Service: Urology;  Laterality: Right;  THIRD STAGE, nephrostomy tube exchange x 2  . NEPHROLITHOTOMY  10/11/2012   Procedure: NEPHROLITHOTOMY PERCUTANEOUS SECOND LOOK;  Surgeon: Felicia Frock, MD;  [facility];  Service: Urology;  Laterality: Right;  RIGHT 4 STAGE PERCUTANOUS NEPHROLITHOTOMY, right URETEROSCOPY WITH HOLMIUM LASER   . [place]  1994  . TOTAL THYROIDECTOMY  2010    Family History  Problem Relation Age of Onset  . Hypertension Mother   . Cancer Mother     throat  . Hypertension Father   . Cancer Other     FH of Breast Cancer  . Cancer Other   . Heart disease Other   . Hypertension Other   . Stroke Other     Grandparent  . Kidney disease Other     Grandparent  . Diabetes Other     FH of Diabetes  . Hypertension Sister   . Cancer Maternal Uncle     Stomach Cancer, Lung CA    Allergies  Allergen Reactions  . Tape Rash    Rash   . Genvoya [Elviteg-Cobic-Emtricit-Tenofaf] Hives  . Lisinopril Cough  . Xylocaine [Lidocaine]     Unknown , patient states she has had lidocaine for IV starts and at the dentist with no problems  . Valsartan Cough    Current Outpatient Prescriptions on File Prior to Visit  Medication Sig Dispense Refill  . albuterol (PROVENTIL HFA;VENTOLIN HFA) 108 (90  Base) MCG/ACT inhaler Inhale 2 puffs into the lungs every 6 (six) hours as needed for wheezing or shortness of breath. 1 Inhaler 5  . amLODipine (NORVASC) 10 MG tablet Take 1 tablet (10 mg total) by mouth daily. 90 tablet 0  . glucose blood (ONETOUCH VERIO) test strip USE TO CHECK BLOOD SUGAR DAILY AND PRN 100 each 4  . hydrochlorothiazide (MICROZIDE) 12.5 MG capsule TAKE ONE CAPSULE BY MOUTH EVERY DAY 30 capsule 2  . levothyroxine (SYNTHROID, LEVOTHROID) 200 MCG tablet TAKE 1 TABLET (200 MCG TOTAL) BY MOUTH DAILY. 30 tablet 1  . metFORMIN (GLUCOPHAGE) 500 MG tablet TAKE 1 TABLET (500 MG TOTAL) BY MOUTH 2 (TWO) TIMES DAILY WITH A MEAL. 60 tablet 5  . mometasone (NASONEX) 50 MCG/ACT nasal spray Place 2 sprays into the nose daily.  17 g 3  . Multiple Vitamin (MULTIVITAMIN) tablet Take 1 tablet by mouth daily.    . ODEFSEY 200-25-25 MG TABS tablet TAKE 1 TABLET BY MOUTH DAILY. 30 tablet 5  . ONE TOUCH LANCETS MISC USE TO CHECK BLOOD SUGAR DAILY AND PRN 100 each 4  . pantoprazole (PROTONIX) 40 MG tablet Take 40 mg by mouth daily.      No current facility-administered medications on file prior to visit.     BP (!) 152/96   Pulse 69   Temp 98.4 F (36.9 C) (Oral)   Ht 5\' 5"  (1.651 m)   Wt 245 lb 12.8 oz (111.5 kg)   LMP 03/31/2014   SpO2 94%   BMI 40.90 kg/m    Objective:   Physical Exam  Constitutional: She appears well-nourished.  Neck: Neck supple.  Cardiovascular: Normal rate, regular rhythm and normal heart sounds.   No murmur heard. Pulmonary/Chest: Effort normal and breath sounds normal. She has no wheezes.  Skin: Skin is warm and dry.          Assessment & Plan:

## 2017-01-18 NOTE — Progress Notes (Signed)
Pre visit review using our clinic review tool, if applicable. No additional management support is needed unless otherwise documented below in the visit note. 

## 2017-01-18 NOTE — Assessment & Plan Note (Addendum)
About goal on several readings, even in office today. Avoid ACE/ARB's given cough and throat tightness. Discussed to resume Amlodipine at 10 mg daily.   Continue HCTZ 12.5 mg. Given palpitations with fluttering, will trial low dose Lopressor BID. ECG today with NSR of 69. No ST abnormality, PAC/PVC.  Discussed to monitor BP and HR over next 2-3 weeks, will have her follow up in the office in 3 weeks.

## 2017-01-18 NOTE — Patient Instructions (Addendum)
Blood Pressure  You are taking too much Amlodipine. Take 1 tablet by mouth once daily.  Continue hydrochlorothiazide 12.5 mg capsules. Take 1 capsule by mouth once daily.  Start metoprolol tartrate 25 mg. Take 1 tablet by mouth twice daily.   Continue to monitor your blood pressure at home once daily. Record your readings and bring them to your next appointment.  Complete lab work prior to leaving today. I will notify you of your results once received.   Follow up in 3 weeks for blood pressure recheck.  It was a pleasure to see you today!

## 2017-01-18 NOTE — Telephone Encounter (Signed)
Patient was seen on 01/18/2017

## 2017-01-19 ENCOUNTER — Encounter: Payer: Self-pay | Admitting: *Deleted

## 2017-02-08 ENCOUNTER — Other Ambulatory Visit (HOSPITAL_COMMUNITY): Payer: Self-pay | Admitting: Otolaryngology

## 2017-02-09 ENCOUNTER — Other Ambulatory Visit: Payer: Self-pay | Admitting: Otolaryngology

## 2017-02-09 DIAGNOSIS — C73 Malignant neoplasm of thyroid gland: Secondary | ICD-10-CM

## 2017-02-13 ENCOUNTER — Other Ambulatory Visit: Payer: Self-pay | Admitting: Primary Care

## 2017-02-27 ENCOUNTER — Encounter (HOSPITAL_COMMUNITY)
Admission: RE | Admit: 2017-02-27 | Discharge: 2017-02-27 | Disposition: A | Discharge status: Home / Self Care | Payer: Managed Care, Other (non HMO) | Source: Ambulatory Visit | Attending: Otolaryngology | Admitting: Otolaryngology

## 2017-02-27 DIAGNOSIS — C73 Malignant neoplasm of thyroid gland: Secondary | ICD-10-CM | POA: Insufficient documentation

## 2017-02-27 MED ORDER — THYROTROPIN ALFA 1.1 MG IM SOLR
0.9000 mg | INTRAMUSCULAR | Status: AC
  Administered 2017-02-27: 0.9 mg via INTRAMUSCULAR

## 2017-02-27 MED ORDER — THYROTROPIN ALFA 1.1 MG IM SOLR
INTRAMUSCULAR | Status: AC
  Filled 2017-02-27: qty 0.9

## 2017-02-28 ENCOUNTER — Encounter (HOSPITAL_COMMUNITY)
Admission: RE | Admit: 2017-02-28 | Discharge: 2017-02-28 | Disposition: A | Discharge status: Home / Self Care | Payer: Managed Care, Other (non HMO) | Source: Ambulatory Visit | Attending: Otolaryngology | Admitting: Otolaryngology

## 2017-02-28 DIAGNOSIS — C73 Malignant neoplasm of thyroid gland: Secondary | ICD-10-CM | POA: Insufficient documentation

## 2017-02-28 MED ORDER — THYROTROPIN ALFA 1.1 MG IM SOLR
INTRAMUSCULAR | Status: AC
  Filled 2017-02-28: qty 0.9

## 2017-02-28 MED ORDER — THYROTROPIN ALFA 1.1 MG IM SOLR
0.9000 mg | INTRAMUSCULAR | Status: AC
  Administered 2017-02-28: 0.9 mg via INTRAMUSCULAR

## 2017-03-01 ENCOUNTER — Encounter (HOSPITAL_COMMUNITY)
Admission: RE | Admit: 2017-03-01 | Discharge: 2017-03-01 | Disposition: A | Discharge status: Home / Self Care | Payer: Managed Care, Other (non HMO) | Source: Ambulatory Visit | Attending: Otolaryngology | Admitting: Otolaryngology

## 2017-03-01 DIAGNOSIS — C73 Malignant neoplasm of thyroid gland: Secondary | ICD-10-CM | POA: Diagnosis not present

## 2017-03-01 LAB — HCG, SERUM, QUALITATIVE: PREG SERUM: NEGATIVE

## 2017-03-03 ENCOUNTER — Encounter (HOSPITAL_COMMUNITY)
Admission: RE | Admit: 2017-03-03 | Discharge: 2017-03-03 | Disposition: A | Discharge status: Home / Self Care | Payer: Managed Care, Other (non HMO) | Source: Ambulatory Visit | Attending: Otolaryngology | Admitting: Otolaryngology

## 2017-03-03 DIAGNOSIS — C73 Malignant neoplasm of thyroid gland: Secondary | ICD-10-CM | POA: Diagnosis not present

## 2017-03-03 MED ORDER — SODIUM IODIDE I 131 CAPSULE: 4.0000 | Freq: Once | INTRAVENOUS | Status: DC | PRN

## 2017-03-13 ENCOUNTER — Other Ambulatory Visit: Payer: Self-pay | Admitting: Primary Care

## 2017-03-13 DIAGNOSIS — I10 Essential (primary) hypertension: Secondary | ICD-10-CM

## 2017-03-14 NOTE — Telephone Encounter (Signed)
Ok to refill? Electronically refill request for metoprolol tartrate (LOPRESSOR) 25 MG tablet. Last prescribed and seen on 01/18/2017

## 2017-03-14 NOTE — Telephone Encounter (Signed)
How's her BP and HR?

## 2017-03-15 MED ORDER — METOPROLOL TARTRATE 25 MG PO TABS: 25.0000 mg | ORAL_TABLET | Freq: Two times a day (BID) | ORAL | 1 refills | Status: DC

## 2017-03-15 NOTE — Telephone Encounter (Signed)
Spoken to patient and she stated she have not checked her blood pressure as she should. The last she checked it was on Friday and it was 134/84 and heart rate 64. Patient stated she is doing fine and taking her medication.

## 2017-03-15 NOTE — Telephone Encounter (Signed)
Noted, refill sent to pharmacy. 

## 2017-03-20 ENCOUNTER — Other Ambulatory Visit: Payer: Managed Care, Other (non HMO)

## 2017-03-20 DIAGNOSIS — B20 Human immunodeficiency virus [HIV] disease: Secondary | ICD-10-CM

## 2017-03-20 LAB — CBC
HEMATOCRIT: 39.6 % (ref 35.0–45.0)
Hemoglobin: 13 g/dL (ref 11.7–15.5)
MCH: 28.1 pg (ref 27.0–33.0)
MCHC: 32.8 g/dL (ref 32.0–36.0)
MCV: 85.5 fL (ref 80.0–100.0)
MPV: 8.9 fL (ref 7.5–12.5)
Platelets: 391 10*3/uL (ref 140–400)
RBC: 4.63 MIL/uL (ref 3.80–5.10)
RDW: 16.3 % — AB (ref 11.0–15.0)
WBC: 7.5 10*3/uL (ref 3.8–10.8)

## 2017-03-21 ENCOUNTER — Ambulatory Visit: Payer: Self-pay | Admitting: Internal Medicine

## 2017-03-21 LAB — LIPID PANEL
CHOL/HDL RATIO: 5 ratio — AB (ref <5.0)
Cholesterol: 194 mg/dL (ref <200)
HDL: 39 mg/dL — ABNORMAL LOW (ref >50)
LDL CALC: 129 mg/dL — AB (ref <100)
Triglycerides: 130 mg/dL (ref <150)
VLDL: 26 mg/dL (ref <30)

## 2017-03-21 LAB — RPR

## 2017-03-21 MED ORDER — METOPROLOL TARTRATE 25 MG PO TABS: 25.0000 mg | ORAL_TABLET | Freq: Two times a day (BID) | ORAL | 1 refills | Status: DC

## 2017-03-21 NOTE — Telephone Encounter (Signed)
Received faxed that medication need to go [facility].

## 2017-03-21 NOTE — Addendum Note (Signed)
Addended by: Jacqualin Combes on: 03/21/2017 05:02 PM   Modules accepted: Orders

## 2017-03-22 ENCOUNTER — Other Ambulatory Visit: Payer: Self-pay | Admitting: Otolaryngology

## 2017-03-22 LAB — HIV-1 RNA QUANT-NO REFLEX-BLD
HIV 1 RNA QUANT: NOT DETECTED {copies}/mL
HIV-1 RNA Quant, Log: <1.3 Log copies/mL

## 2017-03-22 LAB — T-HELPER CELL (CD4) - (RCID CLINIC ONLY)
CD4 % Helper T Cell: 42 % (ref 33–55)
CD4 T Cell Abs: 1520 /uL (ref 400–2700)

## 2017-03-30 ENCOUNTER — Telehealth: Payer: Self-pay

## 2017-03-30 NOTE — Telephone Encounter (Signed)
I spoke with pt; pt went to dentist office at 4:30 and BP was 199/111. Pt had gotten upset earlier at work and was in area without air conditioning. Pt is asymptomatic; no h/a dizziness, CP, SOB, blurred vision or weakness in extremities. Pt has been laying down at home for about 1 hr. Pt rechecked BP 188/102 P 72.  Pt takes lopressor, amlodipine and HCTZ as instructed.  Dr Darnell Level advised if not hypertensive symptoms pt could take one additional HCTZ this evening and schedule appt at [facility] 03/31/17. Pt voiced understanding. Pt scheduled appt with Dr Silvio Pate 03/31/17 at 9:15. If pt condition changes or worsens prior to appt pt will go to ED. FYI to Dr Darnell Level and Dr Silvio Pate.

## 2017-03-30 NOTE — Telephone Encounter (Signed)
Agree. Thanks

## 2017-03-30 NOTE — Telephone Encounter (Signed)
PLEASE NOTE: All timestamps contained within this report are represented as [place] Standard Time. CONFIDENTIALTY NOTICE: This fax transmission is intended only for the addressee. It contains information that is legally privileged, confidential or otherwise protected from use or disclosure. If you are not the intended recipient, you are strictly prohibited from reviewing, disclosing, copying using or disseminating any of this information or taking any action in reliance on or regarding this information. If you have received this fax in error, please notify us immediately by telephone so that we can arrange for its return to [place]. Phone: [phone], Toll-Free: [phone], Fax: [phone] Page: 1 of 1 Call Id: [id number] [place] [facility] - Client Nonclinical Telephone Record [place] [facility] - Client Client Site [place] - Day Physician Alma Friendly - NP Contact Type Call Who Is Calling Patient / Member / Family / Caregiver Caller Name [place] Phone Number [phone] Patient Name [name] Call Type Message Only Information Provided Reason for Call Request for General Office Information Initial Comment Caller states her blood pressure is 198/111. Declined triage wants to speak with office directly. Call Closed By: Merrilee Seashore Transaction Date/Time: 03/30/2017 4:37:34 PM (ET)

## 2017-03-31 ENCOUNTER — Ambulatory Visit (INDEPENDENT_AMBULATORY_CARE_PROVIDER_SITE_OTHER): Payer: Managed Care, Other (non HMO) | Admitting: Internal Medicine

## 2017-03-31 ENCOUNTER — Other Ambulatory Visit (HOSPITAL_COMMUNITY): Payer: Self-pay

## 2017-03-31 ENCOUNTER — Encounter: Payer: Self-pay | Admitting: Internal Medicine

## 2017-03-31 VITALS — BP 190/120 | HR 85 | Ht 65.0 in | Wt 246.0 lb

## 2017-03-31 DIAGNOSIS — I10 Essential (primary) hypertension: Secondary | ICD-10-CM

## 2017-03-31 MED ORDER — LOSARTAN POTASSIUM-HCTZ 100-25 MG PO TABS: 1.0000 | ORAL_TABLET | Freq: Every day | ORAL | 3 refills | Status: DC

## 2017-03-31 MED ORDER — METOPROLOL SUCCINATE ER 50 MG PO TB24: 50.0000 mg | ORAL_TABLET | Freq: Every day | ORAL | 3 refills | Status: DC

## 2017-03-31 NOTE — Assessment & Plan Note (Signed)
BP Readings from Last 3 Encounters:  03/31/17 (!) 190/120  01/18/17 (!) 152/98  01/12/17 (!) 192/109   Probably not throat symptoms from [facility]---thyroid causing this Will add back losartan and increase HCTZ Change metoprolol to once a day May need to consider if she has sleep apnea Due for thyroid surgery on 6/27

## 2017-03-31 NOTE — Progress Notes (Signed)
Pre visit review using our clinic review tool, if applicable. No additional management support is needed unless otherwise documented below in the visit note. 

## 2017-03-31 NOTE — Progress Notes (Signed)
Subjective:    Patient ID: [name], female    DOB: Feb 21, 1966, 51 y.o.   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 albuterol (PROVENTIL HFA;VENTOLIN HFA) 108 (90 Base) MCG/ACT inhaler Inhale 2 puffs into the lungs every 6 (six) hours as needed for wheezing or shortness of breath. 1 Inhaler 5  . amLODipine (NORVASC) 10 MG tablet Take 1 tablet (10 mg total) by mouth daily. 90 tablet 0  . glucose blood (ONETOUCH VERIO) test strip USE TO CHECK BLOOD SUGAR DAILY AND PRN 100 each 4  . hydrochlorothiazide (MICROZIDE) 12.5 MG capsule TAKE ONE CAPSULE BY MOUTH EVERY DAY 30 capsule 2  . levothyroxine (SYNTHROID, LEVOTHROID) 200 MCG tablet TAKE 1 TABLET (200 MCG TOTAL) BY MOUTH DAILY. 30 tablet 5  . metFORMIN (GLUCOPHAGE) 500 MG tablet TAKE 1 TABLET (500 MG TOTAL) BY MOUTH 2 (TWO) TIMES DAILY WITH A MEAL. 60 tablet 5  . metoprolol tartrate (LOPRESSOR) 25 MG tablet Take 1 tablet (25 mg total) by mouth 2 (two) times daily. 180 tablet 1  . mometasone (NASONEX) 50 MCG/ACT nasal spray Place 2 sprays into the nose daily. 17 g 3  . Multiple Vitamin (MULTIVITAMIN) tablet Take 1 tablet by mouth daily.    . ODEFSEY 200-25-25 MG TABS tablet TAKE 1 TABLET BY MOUTH DAILY. 30 tablet 5  . ONE TOUCH LANCETS MISC USE TO CHECK BLOOD SUGAR DAILY AND PRN 100 each 4  . pantoprazole (PROTONIX) 40 MG tablet Take 40 mg by mouth daily.      No current facility-administered medications on  file prior to visit.     Allergies  Allergen Reactions  . Tape Rash    Rash   . Genvoya [Elviteg-Cobic-Emtricit-Tenofaf] Hives  . Lisinopril Cough  . Xylocaine [Lidocaine]     Unknown , patient states she has had lidocaine for IV starts and at the dentist with no problems  . Valsartan Cough    Past Medical History:  Diagnosis Date  . Anemia    Normocytic  . ASTHMA 07/24/2006  . Bronchitis 2005  . 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 Gastroenteritis 07/10/07  . GERD 07/24/2006  . GRAVE'S DISEASE 01/01/2008  . History of hidradenitis suppurativa   . History of thrush   . HIV DISEASE 07/24/2006   dx March 05  . HYPERTENSION 07/24/2006  . Hyperthyroidism 08/2006   Grave's Disease -diffuse radiotracer uptake 08/25/06 Thyroid scan-Cold nodule to R lower lobe of thyrorid  . Menometrorrhagia    hx of  . Nephrolithiasis   . Pneumonia 2005  . 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 Sarcoidosis 02/08/2007   dx as a teenager in [place] from abnl CXR. Completed 2 yrs Prednisone after lung bx confirmation. No symptoms since then.  . THYROID NODULE, RIGHT 02/08/2007    Past Surgical History:  Procedure Laterality Date  . BREAST SURGERY  1997   Breast Reduction   . CYSTOSCOPY W/ URETERAL STENT REMOVAL  11/09/2012   Procedure: CYSTOSCOPY WITH  STENT REMOVAL;  Surgeon: Alexis Frock, MD;  Location: [facility];  Service: Urology;  Laterality: Right;  . CYSTOSCOPY WITH RETROGRADE PYELOGRAM, URETEROSCOPY AND STENT PLACEMENT  11/09/2012   Procedure: CYSTOSCOPY WITH RETROGRADE PYELOGRAM, URETEROSCOPY AND STENT PLACEMENT;  Surgeon: Alexis Frock, MD;  Location: [facility];  Service: Urology;  Laterality: Left;  LEFT URETEROSCOPY, STONE MANIPULATION, left STENT exchange   . CYSTOSCOPY WITH STENT PLACEMENT  10/02/2012   Procedure: CYSTOSCOPY WITH STENT PLACEMENT;  Surgeon: Alexis Frock, MD;  Location: [facility];  Service: Urology;  Laterality: Left;  . DILATION AND CURETTAGE OF UTERUS  Feb 2004     s/p for 1st trimester nonviable pregnancy  . EYE SURGERY     sty under eyelid  . INCISE AND DRAIN ABCESS  Nov 03   s/p I &D for righ inframmary fold hidradenitis  . INCISION AND DRAINAGE PERITONSILLAR ABSCESS  Mar 03  . IRRIGATION AND DEBRIDEMENT ABSCESS  01/31/2012   Procedure: IRRIGATION AND DEBRIDEMENT ABSCESS;  Surgeon: Shann Medal, MD;  Location: [facility];  Service: General;  Laterality: Right;  right breast and axilla   . NEPHROLITHOTOMY  10/02/2012   Procedure: NEPHROLITHOTOMY PERCUTANEOUS;  Surgeon: Alexis Frock, MD;  Location: [facility];  Service: Urology;  Laterality: Right;  First Stage Percutaneous Nephrolithotomy with Surgeon Access, Left Ureteral Stent    . NEPHROLITHOTOMY  10/04/2012   Procedure: NEPHROLITHOTOMY PERCUTANEOUS SECOND LOOK;  Surgeon: Alexis Frock, MD;  Location: [facility];  Service: Urology;  Laterality: Right;     . NEPHROLITHOTOMY  10/08/2012   Procedure: NEPHROLITHOTOMY PERCUTANEOUS;  Surgeon: Alexis Frock, MD;  Location: [facility];  Service: Urology;  Laterality: Right;  THIRD STAGE, nephrostomy tube exchange x 2  . NEPHROLITHOTOMY  10/11/2012   Procedure: NEPHROLITHOTOMY PERCUTANEOUS SECOND LOOK;  Surgeon: Alexis Frock, MD;  Location: [facility];  Service: Urology;  Laterality: Right;  RIGHT 4 STAGE PERCUTANOUS NEPHROLITHOTOMY, right URETEROSCOPY WITH HOLMIUM LASER   . [place]  1994  . TOTAL THYROIDECTOMY  2010    Family History  Problem Relation Age of Onset  . Hypertension Mother   . Cancer Mother        throat  . Hypertension Father   . Cancer Other        FH of Breast Cancer  . Cancer Other   . Heart disease Other   . Hypertension Other   . Stroke Other        Grandparent  . Kidney disease Other        Grandparent  . Diabetes Other        FH of Diabetes  . Hypertension Sister   . Cancer Maternal Uncle        Stomach Cancer, Lung CA    Social History   Social History  . Marital status: Single    Spouse name: N/A  . Number of children:  1  . Years of education: N/A   Occupational History  . [employer]   Social History Main Topics  . Smoking status: Current Every Day Smoker    Packs/day: 0.50    Years: 15.00    Types: Cigarettes  . Smokeless tobacco: Never Used  . Alcohol use No  . Drug use: No  . Sexual activity: Not Currently     Comment: declined condoms   Other Topics Concern  . Not on file   Social History Narrative  . No narrative on file   Review of Systems  Appetite is okay--has gained weight Sleeps  okay at times--- but not great in past 2 months     Objective:   Physical Exam  Constitutional: She appears well-developed. No distress.  Neck: No thyromegaly present.  Cardiovascular: Normal rate, regular rhythm and normal heart sounds.  Exam reveals no gallop.   No murmur heard. Pulmonary/Chest: Effort normal and breath sounds normal. No respiratory distress. She has no wheezes. She has no rales.  Musculoskeletal: She exhibits no edema.  Lymphadenopathy:    She has no cervical adenopathy.  Psychiatric: She has a normal mood and affect. Her behavior is normal.          Assessment & Plan:

## 2017-03-31 NOTE — Telephone Encounter (Signed)
BP has been on high side Will consider increased Rx at appt

## 2017-03-31 NOTE — Patient Instructions (Signed)
Stop the plain HCTZ and start the losartan with the HCTZ. Change the metoprolol to the new once a day type.

## 2017-04-04 NOTE — Pre-Procedure Instructions (Signed)
[name]  04/04/2017      [id number] [address], [place] [address] Phone: [phone] Fax: [phone]  [facility] - [place], [place] [address] [place] [address] Phone: [phone] Fax: [phone]  [facility] [address] - [place], [place] [address] Phone: [phone] Fax: [phone]    Your procedure is scheduled on June 27  Report to [facility] at 900 A.M.  Call this number if you have problems the morning of surgery:  [phone]   Remember:  Do not eat food or drink liquids after midnight.  Take these medicines the morning of surgery with A SIP OF WATER albuterol inhaler if needed- bring your inhalers with you on the day of surgery, Levothyroxine (Synthroid), Odefsey, Pantoprazole (Prontonix), Metoprolol succinate (Lopressor) Nasonex spray if needed  Stop taking aspirin, BC's, Goody's, Herbal medications, Fish Oil, Vitamins, Ibuprofen, Advil, Motrin, Aleve      How to Manage Your Diabetes Before and After Surgery  Why is it important to control my blood sugar before and after surgery? . Improving blood sugar levels before and after surgery helps healing and can limit problems. . A way of improving blood sugar control is eating a healthy diet by: o  Eating less sugar and carbohydrates o  Increasing activity/exercise o  Talking with your doctor about reaching your blood sugar goals . High blood sugars (greater than 180 mg/dL) can raise your risk of infections and slow your recovery, so you will need to focus on controlling your diabetes during the weeks before surgery. . Make sure that the doctor who takes care of your diabetes knows about your planned surgery including the date and location.  How do I manage my blood sugar before surgery? . Check your blood sugar  at least 4 times a day, starting 2 days before surgery, to make sure that the level is not too high or low. o Check your blood sugar the morning of your surgery when you wake up and every 2 hours until you get to the Short Stay unit. . If your blood sugar is less than 70 mg/dL, you will need to treat for low blood sugar: o Do not take insulin. o Treat a low blood sugar (less than 70 mg/dL) with  cup of clear juice (cranberry or apple), 4 glucose tablets, OR glucose gel. o Recheck blood sugar in 15 minutes after treatment (to make sure it is greater than 70 mg/dL). If your blood sugar is not greater than 70 mg/dL on recheck, call [phone] for further instructions. . Report your blood sugar to the short stay nurse when you get to Short Stay.  . If you are admitted to the hospital after surgery: o Your blood sugar will be checked by the staff and you will probably be given insulin after surgery (instead of oral diabetes medicines) to make sure you have good blood sugar levels. o The goal for blood sugar control after surgery is 80-180 mg/dL.              WHAT DO I DO ABOUT MY DIABETES MEDICATION?   Marland Kitchen Do not take oral diabetes medicines (pills) the morning of surgery. Metformin (Glucophage)  . The day of surgery, do not take other diabetes injectables, including Byetta (exenatide), Bydureon (exenatide ER), Victoza (liraglutide), or Trulicity (dulaglutide).  . If your CBG  is greater than 220 mg/dL, you may take  of your sliding scale (correction) dose of insulin.  Other Instructions:          Patient Signature:  Date:   Nurse Signature:  Date:   Reviewed and Endorsed by [facility] Patient Education Committee, August 2015  Do not wear jewelry, make-up or nail polish.  Do not wear lotions, powders, or perfumes, or deoderant.  Do not shave 48 hours prior to surgery.  Men may shave face and neck.  Do not bring valuables to the hospital.  [facility] is not responsible  for any belongings or valuables.  Contacts, dentures or bridgework may not be worn into surgery.  Leave your suitcase in the car.  After surgery it may be brought to your room.  For patients admitted to the hospital, discharge time will be determined by your treatment team.  Patients discharged the day of surgery will not be allowed to drive home.   Special instructions:  [facility] - Preparing for Surgery  Before surgery, you can play an important role.  Because skin is not sterile, your skin needs to be as free of germs as possible.  You can reduce the number of germs on you skin by washing with CHG (chlorahexidine gluconate) soap before surgery.  CHG is an antiseptic cleaner which kills germs and bonds with the skin to continue killing germs even after washing.  Please DO NOT use if you have an allergy to CHG or antibacterial soaps.  If your skin becomes reddened/irritated stop using the CHG and inform your nurse when you arrive at Short Stay.  Do not shave (including legs and underarms) for at least 48 hours prior to the first CHG shower.  You may shave your face.  Please follow these instructions carefully:   1.  Shower with CHG Soap the night before surgery and the   morning of Surgery.  2.  If you choose to wash your hair, wash your hair first as usual with your normal shampoo.  3.  After you shampoo, rinse your hair and body thoroughly to remove the  Shampoo.  4.  Use CHG as you would any other liquid soap.  You can apply chg directly  to the skin and wash gently with scrungie or a clean washcloth.  5.  Apply the CHG Soap to your body ONLY FROM THE NECK DOWN.   Do not use on open wounds or open sores.  Avoid contact with your eyes,  ears, mouth and genitals (private parts).  Wash genitals (private parts)  with your normal soap.  6.  Wash thoroughly, paying special attention to the area where your surgery  will be performed.  7.  Thoroughly rinse your body with warm water from the  neck down.  8.  DO NOT shower/wash with your normal soap after using and rinsing off the CHG Soap.  9.  Pat yourself dry with a clean towel.            10.  Wear clean pajamas.            11.  Place clean sheets on your bed the night of your first shower and do not sleep with pets.  Day of Surgery  Do not apply any lotions/deoderants the morning of surgery.  Please wear clean clothes to the hospital/surgery center.    Please read over the following fact sheets that you were given. Pain Booklet, Coughing and Deep Breathing and Surgical Site Infection  Prevention

## 2017-04-05 ENCOUNTER — Encounter (HOSPITAL_COMMUNITY): Payer: Self-pay

## 2017-04-05 ENCOUNTER — Ambulatory Visit (HOSPITAL_COMMUNITY)
Admission: RE | Admit: 2017-04-05 | Discharge: 2017-04-05 | Disposition: A | Discharge status: Home / Self Care | Payer: Managed Care, Other (non HMO) | Source: Ambulatory Visit | Attending: Anesthesiology | Admitting: Anesthesiology

## 2017-04-05 ENCOUNTER — Encounter (HOSPITAL_COMMUNITY)
Admission: RE | Admit: 2017-04-05 | Discharge: 2017-04-05 | Disposition: A | Discharge status: Home / Self Care | Payer: Managed Care, Other (non HMO) | Source: Ambulatory Visit | Attending: Otolaryngology | Admitting: Otolaryngology

## 2017-04-05 ENCOUNTER — Telehealth: Payer: Self-pay

## 2017-04-05 DIAGNOSIS — Z01812 Encounter for preprocedural laboratory examination: Secondary | ICD-10-CM | POA: Diagnosis not present

## 2017-04-05 DIAGNOSIS — Z01818 Encounter for other preprocedural examination: Secondary | ICD-10-CM | POA: Insufficient documentation

## 2017-04-05 HISTORY — DX: Disease of blood and blood-forming organs, unspecified: D75.9

## 2017-04-05 HISTORY — DX: Anxiety disorder, unspecified: F41.9

## 2017-04-05 HISTORY — DX: Unspecified asthma, uncomplicated: J45.909

## 2017-04-05 HISTORY — DX: Personal history of urinary calculi: Z87.442

## 2017-04-05 HISTORY — DX: Type 2 diabetes mellitus without complications: E11.9

## 2017-04-05 LAB — CBC
HCT: 38.8 % (ref 36.0–46.0)
Hemoglobin: 13 g/dL (ref 12.0–15.0)
MCH: 28.6 pg (ref 26.0–34.0)
MCHC: 33.5 g/dL (ref 30.0–36.0)
MCV: 85.5 fL (ref 78.0–100.0)
PLATELETS: 321 10*3/uL (ref 150–400)
RBC: 4.54 MIL/uL (ref 3.87–5.11)
RDW: 15.2 % (ref 11.5–15.5)
WBC: 7.7 10*3/uL (ref 4.0–10.5)

## 2017-04-05 LAB — BASIC METABOLIC PANEL
Anion gap: 6 (ref 5–15)
BUN: 19 mg/dL (ref 6–20)
CHLORIDE: 103 mmol/L (ref 101–111)
CO2: 27 mmol/L (ref 22–32)
CREATININE: 1.16 mg/dL — AB (ref 0.44–1.00)
Calcium: 9.4 mg/dL (ref 8.9–10.3)
GFR calc Af Amer: >60 mL/min (ref >60)
GFR, EST NON AFRICAN AMERICAN: 55 mL/min — AB (ref >60)
GLUCOSE: 129 mg/dL — AB (ref 65–99)
Potassium: 3.1 mmol/L — ABNORMAL LOW (ref 3.5–5.1)
SODIUM: 136 mmol/L (ref 135–145)

## 2017-04-05 LAB — GLUCOSE, CAPILLARY: GLUCOSE-CAPILLARY: 125 mg/dL — AB (ref 65–99)

## 2017-04-05 MED ORDER — HYDRALAZINE HCL 25 MG PO TABS: 25.0000 mg | ORAL_TABLET | Freq: Three times a day (TID) | ORAL | 1 refills | Status: DC

## 2017-04-05 NOTE — Telephone Encounter (Signed)
Pt was seen today for pre op appt; pts BP was 176/108. Pt was advised at pre op appt if diastolic BP does not come down pts surgery scheduled for 04/07/17 could be postponed.  Pt was seen 03/31/17 and pt is taking medication as instructed. Pt request cb with what to do.[facility] [address]

## 2017-04-05 NOTE — Progress Notes (Addendum)
PCP is Dr. Alma Friendly Denies ever seeing a cardiologist. Denies ever having a card cath, echo, or stress test. Denies any chest pain. Reports her fasting CBG's run 109. Rechecked BP 168/100  Encouraged her to check her Bp at home and if its still running high to call her dr. Quintella Baton that the Dr just changed her meds on Friday.

## 2017-04-05 NOTE — Telephone Encounter (Signed)
May need to add another medication to get her through surgery (and perhaps for long term) Please send Rx for hydralazine 25mg  tid  #90 x 1

## 2017-04-05 NOTE — Telephone Encounter (Signed)
Spoke to pt. Sent rx electronically.

## 2017-04-06 LAB — HEMOGLOBIN A1C
HEMOGLOBIN A1C: 6.7 % — AB (ref 4.8–5.6)
MEAN PLASMA GLUCOSE: 146 mg/dL

## 2017-04-06 NOTE — Progress Notes (Signed)
Anesthesia Chart Review:  Pt is a 51 year old female scheduled for completion thyroidectomy, radical neck dissection on 04/12/2017 with Melida Quitter, MD  - PCP is Viviana Simpler, MD  PMH includes:  HTN, DM, Grave's disease, thyroid cancer, sarcoidosis, HIV, anemia, asthma, GERD. Current smoker. BMI 41  Medications include: Albuterol, levothyroxin, hydralazine, losartan-HCTZ, metformin, metoprolol, Protonix.  BP (!) 168/100   Pulse 81   Temp 36.9 C   Resp 20   Ht 5\' 5"  (1.651 m)   Wt 246 lb 3.2 oz (111.7 kg)   LMP 03/31/2014   SpO2 99%   BMI 40.97 kg/m    Pt reached out to PCP 04/05/17 after elevated BP despite medications found at PAT.  Dr. Silvio Pate added hydralazine to medications  Preoperative labs reviewed. HbA1c 6.7, glucose 129  CXR 04/05/17: No acute disease  EKG 01/18/17: sinus rhythm  If BP acceptable DOS, I anticipate pt can proceed as scheduled.   Harla Mensch, FNP-BC [facility] Short Stay Surgical Center/Anesthesiology Phone: [phone] 04/06/2017 12:53 PM

## 2017-04-07 ENCOUNTER — Other Ambulatory Visit: Payer: Self-pay | Admitting: Otolaryngology

## 2017-04-10 ENCOUNTER — Telehealth: Payer: Self-pay | Admitting: Primary Care

## 2017-04-10 NOTE — Telephone Encounter (Signed)
Please notify patient that I received notification from her dentist that her blood pressure is uncontrolled. Please schedule her for an office visit for treatment/evaluation as she is overdue.

## 2017-04-11 ENCOUNTER — Telehealth: Payer: Self-pay | Admitting: Licensed Clinical Social Worker

## 2017-04-11 ENCOUNTER — Ambulatory Visit (INDEPENDENT_AMBULATORY_CARE_PROVIDER_SITE_OTHER): Payer: Managed Care, Other (non HMO) | Admitting: Internal Medicine

## 2017-04-11 ENCOUNTER — Encounter: Payer: Self-pay | Admitting: Internal Medicine

## 2017-04-11 DIAGNOSIS — F17210 Nicotine dependence, cigarettes, uncomplicated: Secondary | ICD-10-CM

## 2017-04-11 DIAGNOSIS — B20 Human immunodeficiency virus [HIV] disease: Secondary | ICD-10-CM | POA: Diagnosis not present

## 2017-04-11 DIAGNOSIS — F419 Anxiety disorder, unspecified: Secondary | ICD-10-CM

## 2017-04-11 DIAGNOSIS — F411 Generalized anxiety disorder: Secondary | ICD-10-CM | POA: Insufficient documentation

## 2017-04-11 NOTE — Assessment & Plan Note (Signed)
I encouraged her to go through with her plan of quitting smoking. She is obtaining some nicotine patches.

## 2017-04-11 NOTE — Assessment & Plan Note (Signed)
I will arrange for her to meet with our behavioral health counselor, Sande Rives.

## 2017-04-11 NOTE — Progress Notes (Signed)
Patient Active Problem List   Diagnosis Date Noted  . Human immunodeficiency virus (HIV) disease ([place]) 07/24/2006    Priority: High  . Depression 07/24/2006    Priority: High  . Hidradenitis suppurativa of right >> left axilla 01/30/2012    Priority: Medium  . 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 Cigarette smoker 02/08/2007    Priority: Medium  . Essential hypertension 07/24/2006    Priority: Medium  . Asthma 07/24/2006    Priority: Medium  . Chronic anxiety 04/11/2017  . Upper airway cough syndrome 11/11/2016  . Morbid (severe) obesity due to excess calories ([place]) 10/14/2016  . Shortness of breath 10/13/2016  . Allergic rhinitis 02/18/2016  . Right shoulder pain 02/18/2016  . Type 2 diabetes mellitus ([place]) 06/23/2015  . Renal calculi 10/29/2014  . Recurrent boils 06/11/2014  . Abscess of breast, right 01/09/2013  . SARCOIDOSIS 02/08/2007  . GERD 07/24/2006    Patient's Medications  New Prescriptions   No medications on file  Previous Medications   ALBUTEROL (PROVENTIL HFA;VENTOLIN HFA) 108 (90 BASE) MCG/ACT INHALER    Inhale 2 puffs into the lungs every 6 (six) hours as needed for wheezing or shortness of breath.   AMLODIPINE (NORVASC) 10 MG TABLET    Take 1 tablet (10 mg total) by mouth daily.   GLUCOSE BLOOD (ONETOUCH VERIO) TEST STRIP    USE TO CHECK BLOOD SUGAR DAILY AND PRN   HYDRALAZINE (APRESOLINE) 25 MG TABLET    Take 1 tablet (25 mg total) by mouth 3 (three) times daily.   LEVOTHYROXINE (SYNTHROID, LEVOTHROID) 200 MCG TABLET    TAKE 1 TABLET (200 MCG TOTAL) BY MOUTH DAILY.   LOSARTAN-HYDROCHLOROTHIAZIDE (HYZAAR) 100-25 MG TABLET    Take 1 tablet by mouth daily.   METFORMIN (GLUCOPHAGE) 500 MG TABLET    TAKE 1 TABLET (500 MG TOTAL) BY MOUTH 2 (TWO) TIMES DAILY WITH A MEAL.   METOPROLOL SUCCINATE (TOPROL-XL) 50 MG 24 HR TABLET    Take 1 tablet (50 mg total) by mouth daily. Take with or immediately following a meal.   MOMETASONE (NASONEX) 50 MCG/ACT NASAL SPRAY    Place 2 sprays into the nose daily.   MULTIPLE VITAMIN (MULTIVITAMIN) TABLET    Take 1 tablet by mouth daily.   ODEFSEY 200-25-25 MG TABS TABLET    TAKE 1 TABLET BY MOUTH DAILY.   ONE TOUCH LANCETS MISC    USE TO CHECK BLOOD SUGAR DAILY AND PRN   PANTOPRAZOLE (PROTONIX) 40 MG TABLET    Take 40 mg by mouth daily as needed (for indigestion/acid reflux.).   Modified Medications   No medications on file  Discontinued Medications   No medications on file    Subjective: [name] is in for her routine HIV follow-up visit. She has had no problems obtaining, taking or tolerating her Odefsey. She does not recall missing any doses. She is not feeling depressed currently but continues with her chronic anxiety. She is enjoying her new job and finds it is much less stressful than her old one. She continues to smoke cigarettes but plans to quit tomorrow when she is scheduled for thyroid surgery.   Review of Systems: Review of Systems  Constitutional: Negative for chills, diaphoresis, fever, malaise/fatigue and weight loss.  HENT: Negative for sore throat.   Respiratory: Negative for cough, sputum production and shortness of breath.   Cardiovascular: Negative for chest pain.  Gastrointestinal: Negative for abdominal pain, diarrhea,  heartburn, nausea and vomiting.  Genitourinary: Negative for dysuria and frequency.  Musculoskeletal: Negative for joint pain and myalgias.  Skin: Negative for rash.  Neurological: Negative for dizziness and headaches.  Psychiatric/Behavioral: Negative for depression and substance abuse. The patient is nervous/anxious.     Past Medical History:  Diagnosis Date  . Anemia    Normocytic  . Anxiety   . ASTHMA 07/24/2006  . Asthma   . Blood dyscrasia   . Bronchitis 2005  . Cancer ([place])    thyroid  . 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 Diabetes mellitus without complication ([place])   . Gastroenteritis 07/10/07  . GERD  07/24/2006  . GRAVE'S DISEASE 01/01/2008  . History of hidradenitis suppurativa   . History of kidney stones   . History of thrush   . HIV DISEASE 07/24/2006   dx March 05  . HYPERTENSION 07/24/2006  . Hyperthyroidism 08/2006   Grave's Disease -diffuse radiotracer uptake 08/25/06 Thyroid scan-Cold nodule to R lower lobe of thyrorid  . Menometrorrhagia    hx of  . Nephrolithiasis   . Pneumonia 2005  . 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 Sarcoidosis 02/08/2007   dx as a teenager in [place] from abnl CXR. Completed 2 yrs Prednisone after lung bx confirmation. No symptoms since then.  . THYROID NODULE, RIGHT 02/08/2007    Social History  Substance Use Topics  . Smoking status: Current Every Day Smoker    Packs/day: 0.50    Years: 15.00    Types: Cigarettes  . Smokeless tobacco: Never Used  . Alcohol use No    Family History  Problem Relation Age of Onset  . Hypertension Mother   . Cancer Mother        throat  . Hypertension Father   . Cancer Other        FH of Breast Cancer  . Cancer Other   . Heart disease Other   . Hypertension Other   . Stroke Other        Grandparent  . Kidney disease Other        Grandparent  . Diabetes Other        FH of Diabetes  . Hypertension Sister   . Cancer Maternal Uncle        Stomach Cancer, Lung CA    Allergies  Allergen Reactions  . Tape Rash    Rash   . Genvoya [Elviteg-Cobic-Emtricit-Tenofaf] Hives  . Lisinopril Cough  . Xylocaine [Lidocaine]     Unknown , patient states she has had lidocaine for IV starts and at the dentist with no problems  . Valsartan Cough    Pt states she tolerates medicine now    Objective:  There were no vitals filed for this visit. There is no height or weight on file to calculate BMI.  Physical Exam  Constitutional: She is oriented to person, place, and time.  She is a little nervous as usual but in good spirits.  HENT:  Mouth/Throat: No oropharyngeal exudate.  Eyes: Conjunctivae are  normal.  Cardiovascular: Normal rate and regular rhythm.   No murmur heard. Pulmonary/Chest: Effort normal and breath sounds normal.  Abdominal: Soft. She exhibits no mass. There is no tenderness.  Musculoskeletal: Normal range of motion.  Neurological: She is alert and oriented to person, place, and time.  Skin: No rash noted.  Psychiatric: Mood and affect normal.    Lab Results Lab Results  Component Value Date   WBC 7.7 04/05/2017   HGB 13.0 04/05/2017  HCT 38.8 04/05/2017   MCV 85.5 04/05/2017   PLT 321 04/05/2017    Lab Results  Component Value Date   CREATININE 1.16 (H) 04/05/2017   BUN 19 04/05/2017   NA 136 04/05/2017   K 3.1 (L) 04/05/2017   CL 103 04/05/2017   CO2 27 04/05/2017    Lab Results  Component Value Date   ALT 22 01/18/2017   AST 17 01/18/2017   ALKPHOS 80 01/18/2017   BILITOT 0.3 01/18/2017    Lab Results  Component Value Date   CHOL 194 03/20/2017   HDL 39 (L) 03/20/2017   LDLCALC 129 (H) 03/20/2017   TRIG 130 03/20/2017   CHOLHDL 5.0 (H) 03/20/2017   Lab Results  Component Value Date   LABRPR NON REAC 03/20/2017   HIV 1 RNA Quant (copies/mL)  Date Value  03/20/2017 <20 NOT DETECTED  09/02/2016 <20  09/23/2015 <20   CD4 T Cell Abs (/uL)  Date Value  03/20/2017 1,520  09/02/2016 1,190  09/23/2015 1,040     Problem List Items Addressed This Visit      High   Human immunodeficiency virus (HIV) disease ([place])    Her infection remains under excellent, long-term control. She will continue Odefsey in follow-up after blood work in 6 months.      Relevant Orders   T-helper cell (CD4)- ([facility] clinic only)   HIV 1 RNA quant-no reflex-bld   CBC   Comprehensive metabolic panel   Lipid panel   RPR     Medium   Cigarette smoker    I encouraged her to go through with her plan of quitting smoking. She is obtaining some nicotine patches.        Unprioritized   Chronic anxiety    I will arrange for her to meet with our behavioral  health counselor, Sande Rives.           Michel Bickers, MD [facility] for [facility] [place] [facility] [phone] pager   [phone] cell 04/11/2017, 10:46 AM

## 2017-04-11 NOTE — Telephone Encounter (Signed)
Left message introducing self and asking for return call to see what needs are and if desired to make an appointment.  Sande Rives, [facility]

## 2017-04-11 NOTE — Telephone Encounter (Signed)
Spoken to patient. She already had the treatment done. I reminded patient that she has follow up for BP on 04/26/17.  Patient verbalized understanding.

## 2017-04-11 NOTE — Assessment & Plan Note (Signed)
Her infection remains under excellent, long-term control. She will continue Odefsey in follow-up after blood work in 6 months.

## 2017-04-12 ENCOUNTER — Observation Stay (HOSPITAL_COMMUNITY)
Admission: RE | Admit: 2017-04-12 | Discharge: 2017-04-14 | Disposition: A | Discharge status: Home / Self Care | Payer: Managed Care, Other (non HMO) | Source: Ambulatory Visit | Attending: Otolaryngology | Admitting: Otolaryngology

## 2017-04-12 ENCOUNTER — Inpatient Hospital Stay (HOSPITAL_COMMUNITY): Payer: Managed Care, Other (non HMO) | Admitting: Emergency Medicine

## 2017-04-12 ENCOUNTER — Encounter (HOSPITAL_COMMUNITY)
Admission: RE | Disposition: A | Discharge status: Home / Self Care | Payer: Self-pay | Source: Ambulatory Visit | Attending: Otolaryngology

## 2017-04-12 ENCOUNTER — Inpatient Hospital Stay (HOSPITAL_COMMUNITY): Payer: Managed Care, Other (non HMO) | Admitting: Certified Registered Nurse Anesthetist

## 2017-04-12 ENCOUNTER — Encounter (HOSPITAL_COMMUNITY): Payer: Self-pay | Admitting: *Deleted

## 2017-04-12 DIAGNOSIS — Z79899 Other long term (current) drug therapy: Secondary | ICD-10-CM | POA: Diagnosis not present

## 2017-04-12 DIAGNOSIS — Z21 Asymptomatic human immunodeficiency virus [HIV] infection status: Secondary | ICD-10-CM | POA: Diagnosis not present

## 2017-04-12 DIAGNOSIS — Z808 Family history of malignant neoplasm of other organs or systems: Secondary | ICD-10-CM | POA: Insufficient documentation

## 2017-04-12 DIAGNOSIS — Z888 Allergy status to other drugs, medicaments and biological substances status: Secondary | ICD-10-CM | POA: Diagnosis not present

## 2017-04-12 DIAGNOSIS — I10 Essential (primary) hypertension: Secondary | ICD-10-CM | POA: Insufficient documentation

## 2017-04-12 DIAGNOSIS — F329 Major depressive disorder, single episode, unspecified: Secondary | ICD-10-CM | POA: Insufficient documentation

## 2017-04-12 DIAGNOSIS — K219 Gastro-esophageal reflux disease without esophagitis: Secondary | ICD-10-CM | POA: Diagnosis not present

## 2017-04-12 DIAGNOSIS — F17210 Nicotine dependence, cigarettes, uncomplicated: Secondary | ICD-10-CM | POA: Insufficient documentation

## 2017-04-12 DIAGNOSIS — J45909 Unspecified asthma, uncomplicated: Secondary | ICD-10-CM | POA: Diagnosis not present

## 2017-04-12 DIAGNOSIS — F419 Anxiety disorder, unspecified: Secondary | ICD-10-CM | POA: Diagnosis not present

## 2017-04-12 DIAGNOSIS — Z801 Family history of malignant neoplasm of trachea, bronchus and lung: Secondary | ICD-10-CM | POA: Diagnosis not present

## 2017-04-12 DIAGNOSIS — E890 Postprocedural hypothyroidism: Secondary | ICD-10-CM | POA: Diagnosis not present

## 2017-04-12 DIAGNOSIS — Z7984 Long term (current) use of oral hypoglycemic drugs: Secondary | ICD-10-CM | POA: Insufficient documentation

## 2017-04-12 DIAGNOSIS — E119 Type 2 diabetes mellitus without complications: Secondary | ICD-10-CM | POA: Diagnosis not present

## 2017-04-12 DIAGNOSIS — Z800 Family history of malignant neoplasm of digestive organs: Secondary | ICD-10-CM | POA: Insufficient documentation

## 2017-04-12 DIAGNOSIS — C73 Malignant neoplasm of thyroid gland: Secondary | ICD-10-CM | POA: Diagnosis present

## 2017-04-12 DIAGNOSIS — Z803 Family history of malignant neoplasm of breast: Secondary | ICD-10-CM | POA: Insufficient documentation

## 2017-04-12 HISTORY — PX: RADICAL NECK DISSECTION: SHX2284

## 2017-04-12 HISTORY — DX: Malignant neoplasm of thyroid gland: C73

## 2017-04-12 HISTORY — PX: THYROIDECTOMY: SHX17

## 2017-04-12 LAB — GLUCOSE, CAPILLARY
GLUCOSE-CAPILLARY: 184 mg/dL — AB (ref 65–99)
GLUCOSE-CAPILLARY: 172 mg/dL — AB (ref 65–99)
Glucose-Capillary: 173 mg/dL — ABNORMAL HIGH (ref 65–99)
Glucose-Capillary: 315 mg/dL — ABNORMAL HIGH (ref 65–99)

## 2017-04-12 LAB — CALCIUM: CALCIUM: 9.3 mg/dL (ref 8.9–10.3)

## 2017-04-12 SURGERY — THYROIDECTOMY
Anesthesia: General

## 2017-04-12 MED ORDER — METFORMIN HCL 500 MG PO TABS
500.0000 mg | ORAL_TABLET | Freq: Two times a day (BID) | ORAL | Status: DC
  Administered 2017-04-12 – 2017-04-14 (×4): 500 mg via ORAL
  Filled 2017-04-12 (×4): qty 1

## 2017-04-12 MED ORDER — PANTOPRAZOLE SODIUM 40 MG PO TBEC: 40.0000 mg | DELAYED_RELEASE_TABLET | Freq: Every day | ORAL | Status: DC | PRN

## 2017-04-12 MED ORDER — HYDRALAZINE HCL 20 MG/ML IJ SOLN
INTRAMUSCULAR | Status: AC
  Filled 2017-04-12: qty 1

## 2017-04-12 MED ORDER — FENTANYL CITRATE (PF) 250 MCG/5ML IJ SOLN
INTRAMUSCULAR | Status: AC
  Filled 2017-04-12: qty 5

## 2017-04-12 MED ORDER — HYDROCODONE-ACETAMINOPHEN 5-325 MG PO TABS
1.0000 | ORAL_TABLET | ORAL | Status: DC | PRN
  Administered 2017-04-12: 1 via ORAL
  Administered 2017-04-13 (×3): 2 via ORAL
  Administered 2017-04-13: 1 via ORAL
  Administered 2017-04-14 (×2): 2 via ORAL
  Filled 2017-04-12 (×7): qty 2
  Filled 2017-04-12: qty 1

## 2017-04-12 MED ORDER — CHLORHEXIDINE GLUCONATE CLOTH 2 % EX PADS: 6.0000 | MEDICATED_PAD | Freq: Once | CUTANEOUS | Status: DC

## 2017-04-12 MED ORDER — MIDAZOLAM HCL 2 MG/2ML IJ SOLN
INTRAMUSCULAR | Status: AC
  Filled 2017-04-12: qty 2

## 2017-04-12 MED ORDER — PROPOFOL 10 MG/ML IV BOLUS
INTRAVENOUS | Status: DC | PRN
  Administered 2017-04-12: 20 mg via INTRAVENOUS
  Administered 2017-04-12: 50 mg via INTRAVENOUS
  Administered 2017-04-12: 20 mg via INTRAVENOUS
  Administered 2017-04-12: 200 mg via INTRAVENOUS

## 2017-04-12 MED ORDER — SUCCINYLCHOLINE CHLORIDE 20 MG/ML IJ SOLN
INTRAMUSCULAR | Status: DC | PRN
  Administered 2017-04-12: 100 mg via INTRAVENOUS

## 2017-04-12 MED ORDER — DEXAMETHASONE SODIUM PHOSPHATE 10 MG/ML IJ SOLN
INTRAMUSCULAR | Status: DC | PRN
  Administered 2017-04-12: 10 mg via INTRAVENOUS

## 2017-04-12 MED ORDER — PHENYLEPHRINE HCL 10 MG/ML IJ SOLN
INTRAVENOUS | Status: DC | PRN
  Administered 2017-04-12: 50 ug/min via INTRAVENOUS

## 2017-04-12 MED ORDER — BACITRACIN ZINC 500 UNIT/GM EX OINT
TOPICAL_OINTMENT | CUTANEOUS | Status: DC | PRN
  Administered 2017-04-12: 1 via TOPICAL

## 2017-04-12 MED ORDER — LOSARTAN POTASSIUM-HCTZ 100-25 MG PO TABS: 1.0000 | ORAL_TABLET | Freq: Every day | ORAL | Status: DC

## 2017-04-12 MED ORDER — ONDANSETRON HCL 4 MG PO TABS: 4.0000 mg | ORAL_TABLET | ORAL | Status: DC | PRN

## 2017-04-12 MED ORDER — ONDANSETRON HCL 4 MG/2ML IJ SOLN: 4.0000 mg | INTRAMUSCULAR | Status: DC | PRN

## 2017-04-12 MED ORDER — BACITRACIN ZINC 500 UNIT/GM EX OINT
1.0000 "application " | TOPICAL_OINTMENT | Freq: Three times a day (TID) | CUTANEOUS | Status: DC
  Administered 2017-04-12 – 2017-04-14 (×6): 1 via TOPICAL
  Filled 2017-04-12: qty 28.35

## 2017-04-12 MED ORDER — NICOTINE 21 MG/24HR TD PT24
21.0000 mg | MEDICATED_PATCH | Freq: Every day | TRANSDERMAL | Status: DC
  Administered 2017-04-12 – 2017-04-13 (×2): 21 mg via TRANSDERMAL
  Filled 2017-04-12 (×2): qty 1

## 2017-04-12 MED ORDER — HYDRALAZINE HCL 20 MG/ML IJ SOLN
INTRAMUSCULAR | Status: DC | PRN
  Administered 2017-04-12: 10 mg via INTRAVENOUS

## 2017-04-12 MED ORDER — LIDOCAINE 2% (20 MG/ML) 5 ML SYRINGE
INTRAMUSCULAR | Status: AC
  Filled 2017-04-12: qty 5

## 2017-04-12 MED ORDER — PROPOFOL 10 MG/ML IV BOLUS
INTRAVENOUS | Status: AC
  Filled 2017-04-12: qty 20

## 2017-04-12 MED ORDER — LEVOTHYROXINE SODIUM 100 MCG PO TABS
200.0000 ug | ORAL_TABLET | Freq: Every day | ORAL | Status: DC
  Administered 2017-04-13 – 2017-04-14 (×2): 200 ug via ORAL
  Filled 2017-04-12 (×2): qty 2

## 2017-04-12 MED ORDER — FENTANYL CITRATE (PF) 100 MCG/2ML IJ SOLN
INTRAMUSCULAR | Status: DC | PRN
  Administered 2017-04-12: 50 ug via INTRAVENOUS
  Administered 2017-04-12: 100 ug via INTRAVENOUS
  Administered 2017-04-12: 50 ug via INTRAVENOUS
  Administered 2017-04-12: 100 ug via INTRAVENOUS

## 2017-04-12 MED ORDER — BACITRACIN ZINC 500 UNIT/GM EX OINT
TOPICAL_OINTMENT | CUTANEOUS | Status: AC
  Filled 2017-04-12: qty 28.35

## 2017-04-12 MED ORDER — HYDROCHLOROTHIAZIDE 25 MG PO TABS
25.0000 mg | ORAL_TABLET | Freq: Every day | ORAL | Status: DC
  Administered 2017-04-13 – 2017-04-14 (×2): 25 mg via ORAL
  Filled 2017-04-12 (×2): qty 1

## 2017-04-12 MED ORDER — INSULIN ASPART 100 UNIT/ML ~~LOC~~ SOLN
0.0000 [IU] | Freq: Three times a day (TID) | SUBCUTANEOUS | Status: DC
  Administered 2017-04-12 – 2017-04-13 (×3): 4 [IU] via SUBCUTANEOUS
  Administered 2017-04-13: 3 [IU] via SUBCUTANEOUS

## 2017-04-12 MED ORDER — MIDAZOLAM HCL 5 MG/5ML IJ SOLN
INTRAMUSCULAR | Status: DC | PRN
  Administered 2017-04-12: 2 mg via INTRAVENOUS

## 2017-04-12 MED ORDER — MORPHINE SULFATE (PF) 4 MG/ML IV SOLN
2.0000 mg | INTRAVENOUS | Status: DC | PRN
  Administered 2017-04-13: 4 mg via INTRAVENOUS
  Filled 2017-04-12: qty 1

## 2017-04-12 MED ORDER — ALBUTEROL SULFATE HFA 108 (90 BASE) MCG/ACT IN AERS: 2.0000 | INHALATION_SPRAY | Freq: Four times a day (QID) | RESPIRATORY_TRACT | Status: DC | PRN

## 2017-04-12 MED ORDER — SUCCINYLCHOLINE CHLORIDE 200 MG/10ML IV SOSY
PREFILLED_SYRINGE | INTRAVENOUS | Status: AC
  Filled 2017-04-12: qty 10

## 2017-04-12 MED ORDER — PROPOFOL 500 MG/50ML IV EMUL
INTRAVENOUS | Status: DC | PRN
  Administered 2017-04-12: 50 ug/kg/min via INTRAVENOUS

## 2017-04-12 MED ORDER — DEXAMETHASONE SODIUM PHOSPHATE 10 MG/ML IJ SOLN
INTRAMUSCULAR | Status: AC
  Filled 2017-04-12: qty 1

## 2017-04-12 MED ORDER — CEFAZOLIN SODIUM-DEXTROSE 1-4 GM/50ML-% IV SOLN
1.0000 g | Freq: Three times a day (TID) | INTRAVENOUS | Status: AC
  Administered 2017-04-12 – 2017-04-13 (×3): 1 g via INTRAVENOUS
  Filled 2017-04-12 (×3): qty 50

## 2017-04-12 MED ORDER — ONDANSETRON HCL 4 MG/2ML IJ SOLN
INTRAMUSCULAR | Status: AC
  Filled 2017-04-12: qty 6

## 2017-04-12 MED ORDER — LACTATED RINGERS IV SOLN
INTRAVENOUS | Status: DC
  Administered 2017-04-12 (×2): via INTRAVENOUS

## 2017-04-12 MED ORDER — ONDANSETRON HCL 4 MG/2ML IJ SOLN
INTRAMUSCULAR | Status: DC | PRN
Start: 2017-04-12 — End: 2017-04-12
  Administered 2017-04-12: 4 mg via INTRAVENOUS

## 2017-04-12 MED ORDER — CEFAZOLIN SODIUM 1 G IJ SOLR
INTRAMUSCULAR | Status: DC | PRN
  Administered 2017-04-12: 2 g via INTRAMUSCULAR

## 2017-04-12 MED ORDER — LIDOCAINE HCL (CARDIAC) 20 MG/ML IV SOLN
INTRAVENOUS | Status: DC | PRN
  Administered 2017-04-12: 60 mg via INTRAVENOUS

## 2017-04-12 MED ORDER — 0.9 % SODIUM CHLORIDE (POUR BTL) OPTIME
TOPICAL | Status: DC | PRN
  Administered 2017-04-12: 1000 mL

## 2017-04-12 MED ORDER — ALBUTEROL SULFATE (2.5 MG/3ML) 0.083% IN NEBU
2.5000 mg | INHALATION_SOLUTION | Freq: Four times a day (QID) | RESPIRATORY_TRACT | Status: DC | PRN
  Administered 2017-04-12: 2.5 mg via RESPIRATORY_TRACT
  Filled 2017-04-12 (×2): qty 3

## 2017-04-12 MED ORDER — POTASSIUM CHLORIDE IN NACL 20-0.45 MEQ/L-% IV SOLN
INTRAVENOUS | Status: DC
  Administered 2017-04-12 – 2017-04-13 (×2): via INTRAVENOUS
  Filled 2017-04-12 (×3): qty 1000

## 2017-04-12 MED ORDER — HYDRALAZINE HCL 25 MG PO TABS
25.0000 mg | ORAL_TABLET | Freq: Three times a day (TID) | ORAL | Status: DC
  Administered 2017-04-12 – 2017-04-14 (×7): 25 mg via ORAL
  Filled 2017-04-12 (×6): qty 1

## 2017-04-12 MED ORDER — LIDOCAINE HCL 4 % EX SOLN
CUTANEOUS | Status: DC | PRN
  Administered 2017-04-12: 5 mL via TOPICAL

## 2017-04-12 MED ORDER — LOSARTAN POTASSIUM 50 MG PO TABS
100.0000 mg | ORAL_TABLET | Freq: Every day | ORAL | Status: DC
  Administered 2017-04-13 – 2017-04-14 (×2): 100 mg via ORAL
  Filled 2017-04-12 (×2): qty 2

## 2017-04-12 MED ORDER — LIDOCAINE-EPINEPHRINE 1 %-1:100000 IJ SOLN
INTRAMUSCULAR | Status: AC
  Filled 2017-04-12: qty 1

## 2017-04-12 MED ORDER — HYDROMORPHONE HCL 1 MG/ML IJ SOLN: 0.2500 mg | INTRAMUSCULAR | Status: DC | PRN

## 2017-04-12 MED ORDER — INSULIN ASPART 100 UNIT/ML ~~LOC~~ SOLN: 0.0000 [IU] | Freq: Every day | SUBCUTANEOUS | Status: DC

## 2017-04-12 MED ORDER — PHENYLEPHRINE HCL 10 MG/ML IJ SOLN
INTRAMUSCULAR | Status: DC | PRN
  Administered 2017-04-12: 80 ug via INTRAVENOUS
  Administered 2017-04-12: 120 ug via INTRAVENOUS
  Administered 2017-04-12: 40 ug via INTRAVENOUS
  Administered 2017-04-12 (×2): 120 ug via INTRAVENOUS
  Administered 2017-04-12 (×2): 80 ug via INTRAVENOUS
  Administered 2017-04-12: 120 ug via INTRAVENOUS

## 2017-04-12 MED ORDER — DEXAMETHASONE SODIUM PHOSPHATE 10 MG/ML IJ SOLN
INTRAMUSCULAR | Status: AC
  Filled 2017-04-12: qty 2

## 2017-04-12 MED ORDER — ONDANSETRON HCL 4 MG/2ML IJ SOLN
INTRAMUSCULAR | Status: AC
  Filled 2017-04-12: qty 2

## 2017-04-12 MED ORDER — LIDOCAINE-EPINEPHRINE 1 %-1:100000 IJ SOLN
INTRAMUSCULAR | Status: DC | PRN
  Administered 2017-04-12: 10 mL

## 2017-04-12 MED ORDER — ADULT MULTIVITAMIN W/MINERALS CH
1.0000 | ORAL_TABLET | Freq: Every day | ORAL | Status: DC
  Administered 2017-04-13 – 2017-04-14 (×2): 1 via ORAL
  Filled 2017-04-12 (×2): qty 1

## 2017-04-12 MED ORDER — EMTRICITAB-RILPIVIR-TENOFOV AF 200-25-25 MG PO TABS
1.0000 | ORAL_TABLET | Freq: Every day | ORAL | Status: DC
  Administered 2017-04-12 – 2017-04-14 (×3): 1 via ORAL
  Filled 2017-04-12 (×3): qty 1

## 2017-04-12 MED ORDER — PHENYLEPHRINE 40 MCG/ML (10ML) SYRINGE FOR IV PUSH (FOR BLOOD PRESSURE SUPPORT)
PREFILLED_SYRINGE | INTRAVENOUS | Status: AC
  Filled 2017-04-12: qty 10

## 2017-04-12 MED ORDER — METOPROLOL SUCCINATE ER 50 MG PO TB24
50.0000 mg | ORAL_TABLET | Freq: Every day | ORAL | Status: DC
  Administered 2017-04-13 – 2017-04-14 (×2): 50 mg via ORAL
  Filled 2017-04-12 (×2): qty 1

## 2017-04-12 SURGICAL SUPPLY — 61 items
APPLIER CLIP 9.375 SM OPEN (CLIP)
ATTRACTOMAT 16X20 MAGNETIC DRP (DRAPES) IMPLANT
BLADE SURG 15 STRL LF DISP TIS (BLADE) IMPLANT
BLADE SURG 15 STRL SS (BLADE)
CANISTER SUCT 3000ML PPV (MISCELLANEOUS) ×2 IMPLANT
CLEANER TIP ELECTROSURG 2X2 (MISCELLANEOUS) ×2 IMPLANT
CLIP APPLIE 9.375 SM OPEN (CLIP) IMPLANT
CONT SPEC 4OZ CLIKSEAL STRL BL (MISCELLANEOUS) ×6 IMPLANT
CORDS BIPOLAR (ELECTRODE) ×4 IMPLANT
COVER SURGICAL LIGHT HANDLE (MISCELLANEOUS) ×2 IMPLANT
CRADLE DONUT ADULT HEAD (MISCELLANEOUS) ×2 IMPLANT
DERMABOND ADVANCED (GAUZE/BANDAGES/DRESSINGS)
DERMABOND ADVANCED .7 DNX12 (GAUZE/BANDAGES/DRESSINGS) IMPLANT
DRAIN JACKSON RD 7FR 3/32 (WOUND CARE) IMPLANT
DRAIN SNY 10 ROU (WOUND CARE) ×4 IMPLANT
DRAIN WOUND SNY 15 RND (WOUND CARE) IMPLANT
DRAPE HALF SHEET 40X57 (DRAPES) IMPLANT
ELECT COATED BLADE 2.86 ST (ELECTRODE) ×2 IMPLANT
ELECT REM PT RETURN 9FT ADLT (ELECTROSURGICAL) ×2
ELECTRODE REM PT RTRN 9FT ADLT (ELECTROSURGICAL) ×1 IMPLANT
EVACUATOR SILICONE 100CC (DRAIN) ×4 IMPLANT
FORCEPS BIPOLAR SPETZLER 8 1.0 (NEUROSURGERY SUPPLIES) ×2 IMPLANT
GAUZE SPONGE 4X4 16PLY XRAY LF (GAUZE/BANDAGES/DRESSINGS) ×2 IMPLANT
GLOVE BIO SURGEON STRL SZ7.5 (GLOVE) ×8 IMPLANT
GLOVE SURG SS PI 6.5 STRL IVOR (GLOVE) ×8 IMPLANT
GLOVE SURG SS PI 7.0 STRL IVOR (GLOVE) ×2 IMPLANT
GOWN STRL REUS W/ TWL LRG LVL3 (GOWN DISPOSABLE) ×6 IMPLANT
GOWN STRL REUS W/ TWL XL LVL3 (GOWN DISPOSABLE) ×2 IMPLANT
GOWN STRL REUS W/TWL LRG LVL3 (GOWN DISPOSABLE) ×6
GOWN STRL REUS W/TWL XL LVL3 (GOWN DISPOSABLE) ×2
HEMOSTAT SURGICEL 2X14 (HEMOSTASIS) ×2 IMPLANT
KIT BASIN OR (CUSTOM PROCEDURE TRAY) ×2 IMPLANT
KIT ROOM TURNOVER OR (KITS) ×2 IMPLANT
LOCATOR NERVE 3 VOLT (DISPOSABLE) IMPLANT
NEEDLE HYPO 25GX1X1/2 BEV (NEEDLE) ×2 IMPLANT
NS IRRIG 1000ML POUR BTL (IV SOLUTION) ×2 IMPLANT
PAD ARMBOARD 7.5X6 YLW CONV (MISCELLANEOUS) ×4 IMPLANT
PENCIL BUTTON HOLSTER BLD 10FT (ELECTRODE) ×4 IMPLANT
PROBE NERVBE PRASS .33 (MISCELLANEOUS) ×2 IMPLANT
SHEARS HARMONIC 9CM CVD (BLADE) ×2 IMPLANT
SPECIMEN JAR MEDIUM (MISCELLANEOUS) ×2 IMPLANT
SPONGE INTESTINAL PEANUT (DISPOSABLE) IMPLANT
SPONGE LAP 18X18 X RAY DECT (DISPOSABLE) ×4 IMPLANT
STAPLER VISISTAT 35W (STAPLE) ×4 IMPLANT
SUT ETHILON 2 0 FS 18 (SUTURE) ×2 IMPLANT
SUT ETHILON 2 0 PSLX (SUTURE) ×2 IMPLANT
SUT ETHILON 3 0 PS 1 (SUTURE) ×4 IMPLANT
SUT SILK 2 0 SH CR/8 (SUTURE) ×2 IMPLANT
SUT SILK 3 0 REEL (SUTURE) ×2 IMPLANT
SUT SILK 4 0 REEL (SUTURE) ×2 IMPLANT
SUT VIC AB 3-0 SH 27 (SUTURE) ×2
SUT VIC AB 3-0 SH 27X BRD (SUTURE) ×2 IMPLANT
SUT VICRYL 3 0 (SUTURE) IMPLANT
SUT VICRYL 4-0 PS2 18IN ABS (SUTURE) ×4 IMPLANT
TOWEL OR 17X24 6PK STRL BLUE (TOWEL DISPOSABLE) ×4 IMPLANT
TRAY ENT MC OR (CUSTOM PROCEDURE TRAY) ×2 IMPLANT
TRAY FOLEY W/METER SILVER 14FR (SET/KITS/TRAYS/PACK) ×2 IMPLANT
TUBE ENDOTRAC EMG 7X10.2 (MISCELLANEOUS) ×2 IMPLANT
TUBE FEEDING 10FR FLEXIFLO (MISCELLANEOUS) IMPLANT
UNDERPAD 30X30 (UNDERPADS AND DIAPERS) ×2 IMPLANT
WATER STERILE IRR 1000ML POUR (IV SOLUTION) ×2 IMPLANT

## 2017-04-12 NOTE — Progress Notes (Signed)
Arrived to pacu with iv infusing from or 

## 2017-04-12 NOTE — Anesthesia Preprocedure Evaluation (Signed)
Anesthesia Evaluation  Patient identified by MRN, date of birth, ID band Patient awake    Reviewed: Allergy & Precautions, H&P , NPO status , Patient's Chart, lab work & pertinent test results  Airway Mallampati: II  TM Distance: >3 FB Neck ROM: Full    Dental no notable dental hx. (+) Teeth Intact, Dental Advisory Given   Pulmonary asthma , Current Smoker,    Pulmonary exam normal breath sounds clear to auscultation       Cardiovascular hypertension, Pt. on medications and Pt. on home beta blockers  Rhythm:Regular Rate:Normal     Neuro/Psych Anxiety Depression negative neurological ROS  negative psych ROS   GI/Hepatic Neg liver ROS, GERD  Medicated and Controlled,  Endo/Other  diabetes, Type 2, Oral Hypoglycemic AgentsHypothyroidism   Renal/GU negative Renal ROS  negative genitourinary   Musculoskeletal   Abdominal   Peds  Hematology negative hematology ROS (+) anemia ,   Anesthesia Other Findings   Reproductive/Obstetrics negative OB ROS                             Anesthesia Physical Anesthesia Plan  ASA: III  Anesthesia Plan: General   Post-op Pain Management:    Induction: Intravenous  PONV Risk Score and Plan: 3 and Ondansetron, Dexamethasone, Propofol and Midazolam  Airway Management Planned: Oral ETT  Additional Equipment:   Intra-op Plan:   Post-operative Plan: Extubation in OR  Informed Consent: I have reviewed the patients History and Physical, chart, labs and discussed the procedure including the risks, benefits and alternatives for the proposed anesthesia with the patient or authorized representative who has indicated his/her understanding and acceptance.   Dental advisory given  Plan Discussed with: CRNA  Anesthesia Plan Comments:         Anesthesia Quick Evaluation

## 2017-04-12 NOTE — H&P (Signed)
[name] is an 51 y.o. female.   Chief Complaint: Papillary thyroid carcinoma HPI: 51 year old female with remote thyroidectomy for benign disease.  More recently, she was found to have a left neck mass that was positive for papillary thyroid carcinoma by FNA.  She presents for surgical management.  Past Medical History:  Diagnosis Date  . Anemia    Normocytic  . Anxiety   . ASTHMA 07/24/2006  . Asthma   . Blood dyscrasia   . Bronchitis 2005  . Cancer ([place])    thyroid  . 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 Diabetes mellitus without complication ([place])   . Gastroenteritis 07/10/07  . GERD 07/24/2006  . GRAVE'S DISEASE 01/01/2008  . History of hidradenitis suppurativa   . History of kidney stones   . History of thrush   . HIV DISEASE 07/24/2006   dx March 05  . HYPERTENSION 07/24/2006  . Hyperthyroidism 08/2006   Grave's Disease -diffuse radiotracer uptake 08/25/06 Thyroid scan-Cold nodule to R lower lobe of thyrorid  . Menometrorrhagia    hx of  . Nephrolithiasis   . Pneumonia 2005  . 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 Sarcoidosis 02/08/2007   dx as a teenager in [place] from abnl CXR. Completed 2 yrs Prednisone after lung bx confirmation. No symptoms since then.  . THYROID NODULE, RIGHT 02/08/2007    Past Surgical History:  Procedure Laterality Date  . BREAST SURGERY  1997   Breast Reduction   . CYSTOSCOPY W/ URETERAL STENT REMOVAL  11/09/2012   Procedure: CYSTOSCOPY WITH STENT REMOVAL;  Surgeon: Alexis Frock, MD;  Location: [facility];  Service: Urology;  Laterality: Right;  . CYSTOSCOPY WITH RETROGRADE PYELOGRAM, URETEROSCOPY AND STENT PLACEMENT  11/09/2012   Procedure: CYSTOSCOPY WITH RETROGRADE PYELOGRAM, URETEROSCOPY AND STENT PLACEMENT;  Surgeon: Alexis Frock, MD;  Location: [facility];  Service: Urology;  Laterality: Left;  LEFT URETEROSCOPY, STONE MANIPULATION, left STENT exchange   . CYSTOSCOPY WITH STENT PLACEMENT  10/02/2012   Procedure: CYSTOSCOPY  WITH STENT PLACEMENT;  Surgeon: Alexis Frock, MD;  Location: [facility];  Service: Urology;  Laterality: Left;  . DILATION AND CURETTAGE OF UTERUS  Feb 2004   s/p for 1st trimester nonviable pregnancy  . EYE SURGERY     sty under eyelid  . INCISE AND DRAIN ABCESS  Nov 03   s/p I &D for righ inframmary fold hidradenitis  . INCISION AND DRAINAGE PERITONSILLAR ABSCESS  Mar 03  . IRRIGATION AND DEBRIDEMENT ABSCESS  01/31/2012   Procedure: IRRIGATION AND DEBRIDEMENT ABSCESS;  Surgeon: Shann Medal, MD;  Location: [facility];  Service: General;  Laterality: Right;  right breast and axilla   . NEPHROLITHOTOMY  10/02/2012   Procedure: NEPHROLITHOTOMY PERCUTANEOUS;  Surgeon: Alexis Frock, MD;  Location: [facility];  Service: Urology;  Laterality: Right;  First Stage Percutaneous Nephrolithotomy with Surgeon Access, Left Ureteral Stent    . NEPHROLITHOTOMY  10/04/2012   Procedure: NEPHROLITHOTOMY PERCUTANEOUS SECOND LOOK;  Surgeon: Alexis Frock, MD;  Location: [facility];  Service: Urology;  Laterality: Right;     . NEPHROLITHOTOMY  10/08/2012   Procedure: NEPHROLITHOTOMY PERCUTANEOUS;  Surgeon: Alexis Frock, MD;  Location: [facility];  Service: Urology;  Laterality: Right;  THIRD STAGE, nephrostomy tube exchange x 2  . NEPHROLITHOTOMY  10/11/2012   Procedure: NEPHROLITHOTOMY PERCUTANEOUS SECOND LOOK;  Surgeon: Alexis Frock, MD;  Location: [facility];  Service: Urology;  Laterality: Right;  RIGHT 4 STAGE PERCUTANOUS NEPHROLITHOTOMY, right URETEROSCOPY WITH HOLMIUM LASER   . [place]  1994  . TOTAL THYROIDECTOMY  2010    Family History  Problem Relation Age of Onset  . Hypertension Mother   . Cancer Mother        throat  . Hypertension Father   . Cancer Other        FH of Breast Cancer  . Cancer Other   . Heart disease Other   . Hypertension Other   . Stroke Other        Grandparent  . Kidney disease Other        Grandparent  . Diabetes Other        FH of Diabetes  . Hypertension Sister   . Cancer  Maternal Uncle        Stomach Cancer, Lung CA   Social History:  reports that she has been smoking Cigarettes.  She has a 7.50 pack-year smoking history. She has never used smokeless tobacco. She reports that she does not drink alcohol or use drugs.  Allergies:  Allergies  Allergen Reactions  . Tape Rash    Rash   . Genvoya [Elviteg-Cobic-Emtricit-Tenofaf] Hives  . Lisinopril Cough  . Xylocaine [Lidocaine]     Unknown , patient states she has had lidocaine for IV starts and at the dentist with no problems  . Valsartan Cough    Pt states she tolerates medicine now    Medications Prior to Admission  Medication Sig Dispense Refill  . albuterol (PROVENTIL HFA;VENTOLIN HFA) 108 (90 Base) MCG/ACT inhaler Inhale 2 puffs into the lungs every 6 (six) hours as needed for wheezing or shortness of breath. 1 Inhaler 5  . glucose blood (ONETOUCH VERIO) test strip USE TO CHECK BLOOD SUGAR DAILY AND PRN (Patient taking differently: 1 each by Other route daily. ) 100 each 4  . hydrALAZINE (APRESOLINE) 25 MG tablet Take 1 tablet (25 mg total) by mouth 3 (three) times daily. 90 tablet 1  . levothyroxine (SYNTHROID, LEVOTHROID) 200 MCG tablet TAKE 1 TABLET (200 MCG TOTAL) BY MOUTH DAILY. 30 tablet 5  . losartan-hydrochlorothiazide (HYZAAR) 100-25 MG tablet Take 1 tablet by mouth daily. 90 tablet 3  . metFORMIN (GLUCOPHAGE) 500 MG tablet TAKE 1 TABLET (500 MG TOTAL) BY MOUTH 2 (TWO) TIMES DAILY WITH A MEAL. 60 tablet 5  . metoprolol succinate (TOPROL-XL) 50 MG 24 hr tablet Take 1 tablet (50 mg total) by mouth daily. Take with or immediately following a meal. 90 tablet 3  . mometasone (NASONEX) 50 MCG/ACT nasal spray Place 2 sprays into the nose daily. (Patient taking differently: Place 2 sprays into the nose daily as needed (for seasonal allergies.). ) 17 g 3  . Multiple Vitamin (MULTIVITAMIN) tablet Take 1 tablet by mouth daily.    . ODEFSEY 200-25-25 MG TABS tablet TAKE 1 TABLET BY MOUTH DAILY. 30 tablet  5  . ONE TOUCH LANCETS MISC USE TO CHECK BLOOD SUGAR DAILY AND PRN (Patient taking differently: 1 each by Other route daily. ) 100 each 4  . pantoprazole (PROTONIX) 40 MG tablet Take 40 mg by mouth daily as needed (for indigestion/acid reflux.).     Marland Kitchen amLODipine (NORVASC) 10 MG tablet Take 1 tablet (10 mg total) by mouth daily. (Patient not taking: Reported on 04/03/2017) 90 tablet 0    Results for orders placed or performed during the hospital encounter of 04/12/17 (from the past 48 hour(s))  Glucose, capillary     Status: Abnormal   Collection Time: 04/12/17  9:33 AM  Result Value Ref Range  Glucose-Capillary 184 (H) 65 - 99 mg/dL   Comment 1 Notify RN    Comment 2 Document in Chart    No results found.  Review of Systems  All other systems reviewed and are negative.   Height 5\' 5"  (1.651 m), weight 242 lb (109.8 kg), last menstrual period 03/31/2014. Physical Exam  Constitutional: She is oriented to person, place, and time. She appears well-developed and well-nourished. No distress.  HENT:  Head: Normocephalic and atraumatic.  Right Ear: External ear normal.  Left Ear: External ear normal.  Nose: Nose normal.  Mouth/Throat: Oropharynx is clear and moist.  Eyes: Conjunctivae and EOM are normal. Pupils are equal, round, and reactive to light.  Neck: Normal range of motion. Neck supple.  Thyroidectomy scar.  Left neck mass near thyroid cartilage.  Cardiovascular: Normal rate.   Respiratory: Effort normal.  Musculoskeletal: Normal range of motion.  Neurological: She is alert and oriented to person, place, and time. No cranial nerve deficit.  Skin: Skin is warm and dry.  Psychiatric: She has a normal mood and affect. Her behavior is normal. Judgment and thought content normal.     Assessment/Plan Metastatic papillary thyroid carcinoma To OR for completion thyroidectomy and limited left neck dissection.  Melida Quitter, MD 04/12/2017, 11:22 AM

## 2017-04-12 NOTE — Transfer of Care (Signed)
Immediate Anesthesia Transfer of Care Note  Patient: [name]  Procedure(s) Performed: Procedure(s) with comments: THYROIDECTOMY (N/A) - Completion Thyroidectomy RADICAL NECK DISSECTION (N/A) - limited neck dissection 2 hours total  Patient Location: PACU  Anesthesia Type:General  Level of Consciousness: awake, alert  and patient cooperative  Airway & Oxygen Therapy: Patient Spontanous Breathing and Patient connected to face mask oxygen  Post-op Assessment: Report given to RN and Post -op Vital signs reviewed and stable  Post vital signs: Reviewed and stable  Last Vitals: There were no vitals filed for this visit.  Last Pain: There were no vitals filed for this visit.       Complications: No apparent anesthesia complications

## 2017-04-12 NOTE — Brief Op Note (Signed)
04/12/2017  1:57 PM  PATIENT:  [name]  51 y.o. female  PRE-OPERATIVE DIAGNOSIS:  METASTATIC PAPILLARY THYROID CARCINOMA  POST-OPERATIVE DIAGNOSIS:  METASTATIC PAPILLARY THYROID CARCINOMA  PROCEDURE:  Procedure(s) with comments: THYROIDECTOMY (N/A) - Completion Thyroidectomy RADICAL NECK DISSECTION (N/A) - limited neck dissection 2 hours total  SURGEON:  Surgeon(s) and Role:    * Melida Quitter, MD - Primary    * Izora Gala, MD - Assisting  PHYSICIAN ASSISTANT:   ASSISTANTS: Rosen   ANESTHESIA:   general  EBL:  Total I/O In: 1200 [I.V.:1200] Out: 130 [Blood:130]  BLOOD ADMINISTERED:none  DRAINS: (10 Fr) Jackson-Pratt drain(s) with closed bulb suction in the neck   LOCAL MEDICATIONS USED:  LIDOCAINE   SPECIMEN:  Source of Specimen:  Left thyroid remnant, left zone 3 neck  DISPOSITION OF SPECIMEN:  PATHOLOGY  COUNTS:  YES  TOURNIQUET:  * No tourniquets in log *  DICTATION: .Other Dictation: Dictation Number [id number]  PLAN OF CARE: Admit for overnight observation  PATIENT DISPOSITION:  PACU - hemodynamically stable.   Delay start of Pharmacological VTE agent (>24hrs) due to surgical blood loss or risk of bleeding: yes

## 2017-04-12 NOTE — Anesthesia Postprocedure Evaluation (Signed)
Anesthesia Post Note  Patient: [name]  Procedure(s) Performed: Procedure(s) (LRB): THYROIDECTOMY (N/A) RADICAL NECK DISSECTION (N/A)     Patient location during evaluation: PACU Anesthesia Type: General Level of consciousness: awake and alert Pain management: pain level controlled Vital Signs Assessment: post-procedure vital signs reviewed and stable Respiratory status: spontaneous breathing, nonlabored ventilation, respiratory function stable and patient connected to nasal cannula oxygen Cardiovascular status: blood pressure returned to baseline and stable Postop Assessment: no signs of nausea or vomiting Anesthetic complications: no    Last Vitals:  Vitals:   04/12/17 1510 04/12/17 1542  BP: 135/79 136/74  Pulse: 71 75  Resp: (!) 22   Temp: 36.6 C 36.8 C    Last Pain:  Vitals:   04/12/17 1542  TempSrc: Oral  PainSc:                  Keisy Strickler,W. EDMOND

## 2017-04-12 NOTE — Anesthesia Procedure Notes (Signed)
Procedure Name: Intubation Date/Time: 04/12/2017 11:36 AM Performed by: Salli Quarry Jacoba Cherney Pre-anesthesia Checklist: Patient identified, Emergency Drugs available, Suction available and Patient being monitored Patient Re-evaluated:Patient Re-evaluated prior to inductionOxygen Delivery Method: Circle System Utilized Preoxygenation: Pre-oxygenation with 100% oxygen Intubation Type: IV induction Ventilation: Mask ventilation without difficulty Laryngoscope Size: Mac and 4 Grade View: Grade I Tube type: Oral (NIMs ETT) Tube size: 7.0 mm Number of attempts: 1 Airway Equipment and Method: Stylet and Oral airway Placement Confirmation: ETT inserted through vocal cords under direct vision,  positive ETCO2 and breath sounds checked- equal and bilateral Secured at: 22 cm Tube secured with: Tape Dental Injury: Teeth and Oropharynx as per pre-operative assessment

## 2017-04-12 NOTE — Progress Notes (Signed)
Day of Surgery   Subjective/Chief Complaint: Patient feels well. She's not having any specific complaints. No tingling, numbness, or muscle twitching. Her voice is weak.   Objective: Vital signs in last 24 hours: Temp:  [97.2 F (36.2 C)-98.5 F (36.9 C)] 98.5 F (36.9 C) (06/27 2128) Pulse Rate:  [68-90] 90 (06/27 2128) Resp:  [22-26] 22 (06/27 1510) BP: (126-152)/(69-79) 144/69 (06/27 2158) SpO2:  [92 %-99 %] 92 % (06/27 2128) Weight:  [109.8 kg (242 lb)-113.6 kg (250 lb 7.1 oz)] 113.6 kg (250 lb 7.1 oz) (06/27 1600) Last BM Date: 04/11/17  Intake/Output from previous day: No intake/output data recorded. Intake/Output this shift: Total I/O In: -  Out: 50 [Drains:50]  She is awake and alert. Voice is weak but does not sound to breath the. Wound looks excellent. There is no evidence of muscle spasms  Lab Results:  No results for input(s): WBC, HGB, HCT, PLT in the last 72 hours. BMET  Recent Labs  04/12/17 2045  CALCIUM 9.3   PT/INR No results for input(s): LABPROT, INR in the last 72 hours. ABG No results for input(s): PHART, HCO3 in the last 72 hours.  Invalid input(s): PCO2, PO2  Studies/Results: No results found.  Anti-infectives: Anti-infectives    Start     Dose/Rate Route Frequency Ordered Stop   04/12/17 1800  ceFAZolin (ANCEF) IVPB 1 g/50 mL premix     1 g 100 mL/hr over 30 Minutes Intravenous Every 8 hours 04/12/17 1543 04/13/17 2159   04/12/17 1700  emtricitabine-rilpivir-tenofovir AF (ODEFSEY) 200-25-25 MG per tablet 1 tablet     1 tablet Oral Daily 04/12/17 1543        Assessment/Plan: s/p Procedure(s) with comments: THYROIDECTOMY (N/A) - Completion Thyroidectomy RADICAL NECK DISSECTION (N/A) - limited neck dissection 2 hours total Her calcium level was 9.3. She's doing well. Wound looks excellent. Continue care.  LOS: 1 day    Melissa Montane 04/12/2017

## 2017-04-13 ENCOUNTER — Encounter (HOSPITAL_COMMUNITY): Payer: Self-pay | Admitting: Otolaryngology

## 2017-04-13 DIAGNOSIS — C73 Malignant neoplasm of thyroid gland: Secondary | ICD-10-CM | POA: Diagnosis not present

## 2017-04-13 LAB — GLUCOSE, CAPILLARY
GLUCOSE-CAPILLARY: 123 mg/dL — AB (ref 65–99)
Glucose-Capillary: 146 mg/dL — ABNORMAL HIGH (ref 65–99)
Glucose-Capillary: 132 mg/dL — ABNORMAL HIGH (ref 65–99)
Glucose-Capillary: 171 mg/dL — ABNORMAL HIGH (ref 65–99)

## 2017-04-13 LAB — CALCIUM
CALCIUM: 8.8 mg/dL — AB (ref 8.9–10.3)
Calcium: 8.9 mg/dL (ref 8.9–10.3)

## 2017-04-13 MED ORDER — DOCUSATE SODIUM 100 MG PO CAPS
100.0000 mg | ORAL_CAPSULE | Freq: Two times a day (BID) | ORAL | Status: DC | PRN
  Administered 2017-04-13: 100 mg via ORAL
  Filled 2017-04-13: qty 1

## 2017-04-13 NOTE — Progress Notes (Signed)
   Subjective:    Patient ID: [name], female    DOB: August 21, 1966, 51 y.o.   MRN: [id number]  HPI Complains of neck soreness.  Swallowing well.  Review of Systems     Objective:   Physical Exam AF VSS Drain 120 cc Alert, NAD Neck incision clean and intact, no fluid collection, drain functioning Voice scratchy hoarse, not breathy.     Assessment & Plan:  Metastatic papillary thyroid carcinoma s/p completion thyroidectomy and limited neck dissection  Doing well.  Will plan to observe another night due to pain and drain output.  Calcium down slightly, will continue to monitor.

## 2017-04-13 NOTE — Op Note (Signed)
[name], [name]                ACCOUNT NO.:  [id number]  MEDICAL RECORD NO.:  [id number]  LOCATION:                                 FACILITY:  PHYSICIAN:  Onnie Graham, MD     DATE OF BIRTH:  11-26-1965  DATE OF PROCEDURE:  04/12/2017 DATE OF DISCHARGE:                              OPERATIVE REPORT   PREOPERATIVE DIAGNOSIS:  Metastatic papillary thyroid carcinoma.  POSTOPERATIVE DIAGNOSIS:  Metastatic papillary thyroid carcinoma.  PROCEDURE:  Completion thyroidectomy and limited left neck dissection.  SURGEON:  Onnie Graham, MD.  ASSISTANTGarret Reddish H. Constance Holster, MD.  ANESTHESIA:  General endotracheal anesthesia.  COMPLICATIONS:  None.  INDICATION:  The patient is a 51 year old female who had a remote total thyroidectomy for benign disease.  More recently presented with a left neck mass.  Fine-needle aspiration demonstrated papillary thyroid carcinoma in the mass.  There was a thyroid remnant identified on CT, but needle biopsy of that area was benign.  She has additional lymph node seen in the neck.  She presents to the operating room for surgical management.  FINDINGS:  A rounded nodule of thyroid tissue was identified in the left thyroid bed.  The recurrent laryngeal nerve was not seen during the dissection.  A firm mass was palpated in the left zone 3 region and zone 3 was fully cleared out with a few additional nodes included.  Further palpation of the neck did not reveal any palpable mass.  DESCRIPTION OF PROCEDURE:  The patient was identified in the holding room.  Informed consent having been obtained including discussion of risks, benefits, alternatives, the patient was brought to the operating suite and put on the operating table in supine position.  Anesthesia was induced and the patient was intubated by the Anesthesia team without difficulty.  The patient was given intravenous antibiotics during the case.  The eyes taped closed and a shoulder roll was  placed.  The chest was taped inferiorly.  The neck incision was marked with a marking pen and injected with 1% lidocaine with 1:100,000 epinephrine.  The neck was prepped and draped in sterile fashion.  A nerve integrity monitor endotracheal tube was used during the case and was turned on for the case.  The neck incision was made with a 15-blade scalpel and extended through subcutaneous tissue and platysma layer using Bovie electrocautery.  Subplatysmal flaps were elevated superiorly and inferiorly and a self-retaining retractor was added.  Dissection was then performed inferior to the thyroid cartilage in the midline, dividing the midline raphae which was a bit difficult to identify. Muscular tissue was then dissected from the anterior tracheal wall a little bit to the right and then primarily to the left.  With dissection, the nerve stimulator was used periodically to check tissues and the nerve itself was never stimulated directly or identified visually.  Careful dissection under the left-sided strap muscles yielded the small nodule of thyroid tissue, which was then carefully dissected from surrounding tissues again using the nerve stimulator and careful dissection.  The nodule was removed and passed to Nursing for pathology. There was no additional pathology seen in the thyroid bed or palpated. At  this point, dissection was performed around zone 3, starting superiorly, dissecting through the soft tissues to the inferior extent of the submandibular gland and then dissecting anteriorly, removing the mass from the thyroid cartilage region soft tissues.  Some of the strap muscle tissue was removed with the mass medially.  Further dissection was then performed around the soft tissues of the mass down to the omohyoid muscle which was kept intact and further dissection performed posteriorly through the sternocleidomastoid muscle and jugular vein which were identified and kept intact.  Soft  tissues were fully removed from this region including a few additional nodes with the zone passed en bloc to Nursing for pathology.  The neck was then carefully palpated superiorly, inferiorly, medially, and laterally, and no additional palpable mass was found.  At this point, bleeding was controlled with bipolar electrocautery as well as ligation.  The patient was given Valsalva and no additional bleeding was seen.  The wound was copiously irrigated with saline.  A 10-French round suction drain was placed in the depth of the wound and secured to the skin using a 2-0 nylon suture in a standard drain stitch.  The platysma layer was closed with 3-0 Vicryl suture in a simple running fashion and the skin was closed with staples.  At this point, drapes were removed and the patient was cleaned off.  She began to wake up and began coughing and the neck immediately filled with blood in the dissection zone with the drain overwhelmed. Thus, the neck was re-prepped and draped and the staples and drain removed.  The suture line was removed as well and the neck opened.  Clot was removed from the depths of the wound and the entire area was suctioned and cleared out of blood and clot.  The entire area was copiously irrigated with saline.  The bleeding site was identified in the superolateral portion of the dissection where a vessel was then ligated.  Some Surgicel was then placed down in the depth of the wound, both in the lateral neck as well as in the thyroid bed.  A new 10-French suction drain was placed and secured to the skin in the same fashion. The platysma layer was closed with 3-0 Vicryl suture in a simple running fashion and the skin closed with staples once again.  At this point, the patient was once again cleaned off and the drain hooked to bulb suction. The incision was covered with bacitracin ointment and the drain was taped to the left shoulder.  At this time, during wake up, pressure  was held against the neck and she was awakened and extubated and moved to the recovery room in stable condition.     Onnie Graham, MD   ______________________________ Onnie Graham, MD    DDB/MEDQ  D:  04/12/2017  T:  04/12/2017  Job:  [id number]  cc:   Onnie Graham, MD's Office

## 2017-04-14 DIAGNOSIS — C73 Malignant neoplasm of thyroid gland: Secondary | ICD-10-CM | POA: Diagnosis not present

## 2017-04-14 LAB — GLUCOSE, CAPILLARY: GLUCOSE-CAPILLARY: 115 mg/dL — AB (ref 65–99)

## 2017-04-14 MED ORDER — HYDROCODONE-ACETAMINOPHEN 5-325 MG PO TABS: 1.0000 | ORAL_TABLET | Freq: Four times a day (QID) | ORAL | 0 refills | Status: DC | PRN

## 2017-04-14 NOTE — Care Management Note (Signed)
Case Management Note  Patient Details  Name: NOLITA KUTTER MRN: [id number] Date of Birth: 04-23-1966  Subjective/Objective:                    Action/Plan: Pt discharged home with self care. Pt has PCP, insurance and transportation home. No further needs per CM.  Expected Discharge Date:  04/14/17               Expected Discharge Plan:  Home/Self Care  In-House Referral:     Discharge planning Services     Post Acute Care Choice:    Choice offered to:     DME Arranged:    DME Agency:     HH Arranged:    HH Agency:     Status of Service:  Completed, signed off  If discussed at [employer] of Stay Meetings, dates discussed:    Additional Comments:  Pollie Friar, RN 04/14/2017, 10:34 AM

## 2017-04-14 NOTE — Discharge Summary (Signed)
Physician Discharge Summary  Patient ID: [name] MRN: [id number] DOB/AGE: 12-15-1965 51 y.o.  Admit date: 04/12/2017 Discharge date: 04/14/2017  Admission Diagnoses: Metastatic papillary thyroid carcinoma  Discharge Diagnoses:  Active Problems:   Papillary thyroid carcinoma [facility])   Discharged Condition: good  Hospital Course: 51 year old female with remote total thyroidectomy for remote disease with recent left-sided neck mass found to represent papillary thyroid carcinoma.  She presented for surgical management.  See operative note.  She was observed two nights after surgery with drain in place and did well.  Calcium remained stable.  Pain improved.  Drain output declined.  On POD 2, she was felt stable for discharge.  Consults: None  Significant Diagnostic Studies: None  Treatments: surgery: Completion thyroidectomy with limited neck dissection  Discharge Exam: Blood pressure (!) 158/90, pulse 77, temperature 98.4 F (36.9 C), temperature source Oral, resp. rate 20, height 5\' 5"  (1.651 m), weight 250 lb 7.1 oz (113.6 kg), last menstrual period 03/31/2014, SpO2 96 %. General appearance: alert, cooperative, no distress and good voice Neck: neck incision clean and intact, no fluid collection, drain removed  Disposition: 01-Home or Self Care  Discharge Instructions    Diet - low sodium heart healthy    Complete by:  As directed    Discharge instructions    Complete by:  As directed    Avoid strenuous activity.  Keep head elevated.  Normal diet.  Apply antibiotic ointment to incision twice daily.   Increase activity slowly    Complete by:  As directed      Allergies as of 04/14/2017      Reactions   Tape Rash   Rash   Genvoya [elviteg-cobic-emtricit-tenofaf] Hives   Lisinopril Cough   Xylocaine [lidocaine]    Unknown , patient states she has had lidocaine for IV starts and at the dentist with no problems   Valsartan Cough   Pt states she tolerates medicine now       Medication List    TAKE these medications   albuterol 108 (90 Base) MCG/ACT inhaler Commonly known as:  PROVENTIL HFA;VENTOLIN HFA Inhale 2 puffs into the lungs every 6 (six) hours as needed for wheezing or shortness of breath.   amLODipine 10 MG tablet Commonly known as:  NORVASC Take 1 tablet (10 mg total) by mouth daily.   glucose blood test strip Commonly known as:  ONETOUCH VERIO USE TO CHECK BLOOD SUGAR DAILY AND PRN What changed:  how much to take  how to take this  when to take this  additional instructions   hydrALAZINE 25 MG tablet Commonly known as:  APRESOLINE Take 1 tablet (25 mg total) by mouth 3 (three) times daily.   HYDROcodone-acetaminophen 5-325 MG tablet Commonly known as:  NORCO/VICODIN Take 1-2 tablets by mouth every 6 (six) hours as needed for moderate pain.   levothyroxine 200 MCG tablet Commonly known as:  SYNTHROID, LEVOTHROID TAKE 1 TABLET (200 MCG TOTAL) BY MOUTH DAILY.   losartan-hydrochlorothiazide 100-25 MG tablet Commonly known as:  HYZAAR Take 1 tablet by mouth daily.   metFORMIN 500 MG tablet Commonly known as:  GLUCOPHAGE TAKE 1 TABLET (500 MG TOTAL) BY MOUTH 2 (TWO) TIMES DAILY WITH A MEAL.   metoprolol succinate 50 MG 24 hr tablet Commonly known as:  TOPROL-XL Take 1 tablet (50 mg total) by mouth daily. Take with or immediately following a meal.   mometasone 50 MCG/ACT nasal spray Commonly known as:  NASONEX Place 2 sprays into the  nose daily. What changed:  when to take this  reasons to take this   multivitamin tablet Take 1 tablet by mouth daily.   ODEFSEY 200-25-25 MG Tabs tablet Generic drug:  emtricitabine-rilpivir-tenofovir AF TAKE 1 TABLET BY MOUTH DAILY.   ONE TOUCH LANCETS Misc USE TO CHECK BLOOD SUGAR DAILY AND PRN What changed:  how much to take  how to take this  when to take this  additional instructions   pantoprazole 40 MG tablet Commonly known as:  PROTONIX Take 40 mg by mouth daily  as needed (for indigestion/acid reflux.).      Follow-up Information    Melida Quitter, MD. Schedule an appointment as soon as possible for a visit on 04/20/2017.   Specialty:  Otolaryngology Contact information: [address] [place] [address] [phone]           Signed: Melida Quitter 04/14/2017, 7:20 AM

## 2017-04-14 NOTE — Progress Notes (Signed)
Discharge paperwork reviewed with patient. Patient had questions about showering and pain medications. Questions addressed. Prescription given. Patient is ready for discharge.

## 2017-04-26 ENCOUNTER — Encounter: Payer: Self-pay | Admitting: Primary Care

## 2017-04-26 ENCOUNTER — Ambulatory Visit (INDEPENDENT_AMBULATORY_CARE_PROVIDER_SITE_OTHER): Payer: Managed Care, Other (non HMO) | Admitting: Primary Care

## 2017-04-26 VITALS — BP 138/92 | HR 66 | Temp 98.0°F | Ht 65.0 in | Wt 242.8 lb

## 2017-04-26 DIAGNOSIS — E785 Hyperlipidemia, unspecified: Secondary | ICD-10-CM | POA: Insufficient documentation

## 2017-04-26 DIAGNOSIS — I10 Essential (primary) hypertension: Secondary | ICD-10-CM

## 2017-04-26 DIAGNOSIS — E119 Type 2 diabetes mellitus without complications: Secondary | ICD-10-CM

## 2017-04-26 MED ORDER — ATORVASTATIN CALCIUM 20 MG PO TABS: ORAL_TABLET | ORAL | 3 refills | Status: DC

## 2017-04-26 NOTE — Assessment & Plan Note (Signed)
Lipid panel above goal in June 2018.  The 10-year ASCVD risk score [name] DC Brooke Bonito., et al., 2013) is: 28.5%   Values used to calculate the score:     Age: 51 years     Sex: Female     Is Non-Hispanic African American: Yes     Diabetic: Yes     Tobacco smoker: Yes     Systolic Blood Pressure: 993 mmHg     Is BP treated: Yes     HDL Cholesterol: 39 mg/dL     Total Cholesterol: 194 mg/dL  Rx for atorvastatin provided today. Check lipids and LFT's in 6 weeks. Recommended she work on weight loss.

## 2017-04-26 NOTE — Progress Notes (Signed)
Subjective:    Patient ID: [name], female    DOB: 1966-05-28, 51 y.o.   MRN: [id number]  HPI  [name] is a 51 year old female who presents today for follow up.  1) Essential Hypertension: Currently managed on Amlodipine 10 mg, hydralazine 25 mg TID, losartan-HCTZ 100-25 mg, Toprol XL 50 mg. Her BP in the office today is 142/92, 138/92 on recheck. She's been taking her hydralazine twice daily for the past several weeks, not three times daily. She denies chest pain, headaches.   She was seen in our clinic in mid June with hypertension. Hyzaar 100-25 mg and hydralazine 25 mg TID was added to her regimen. Her Lopressor BID was changed to Toprol XL once daily. She's not taking Amlodipine 10 mg as she thought this was removed from her regimen.   2) Type 2 Diabetes: A1C in June 2018 of 6.7 which is a slight increase from 6.6 in April 2018. She is currently managed on Metformin 500 mg BID. She is not managed on a statin, is taking ARB.  3) Hyperlipidemia: TC of 194, LDL of 129 in early June 2018. She is not managed on statin therapy.   The 10-year ASCVD risk score [name]., et al., 2013) is: 31.6%   Values used to calculate the score:     Age: 73 years     Sex: Female     Is Non-Hispanic African American: Yes     Diabetic: Yes     Tobacco smoker: Yes     Systolic Blood Pressure: 470 mmHg     Is BP treated: Yes     HDL Cholesterol: 39 mg/dL     Total Cholesterol: 194 mg/dL   Review of Systems  Constitutional: Negative for fever.  Eyes: Negative for visual disturbance.  Respiratory: Negative for shortness of breath.   Cardiovascular: Negative for chest pain.  Neurological: Negative for dizziness and headaches.       Past Medical History:  Diagnosis Date  . Anemia    Normocytic  . Anxiety   . Asthma   . Blood dyscrasia   . Bronchitis 2005  . 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 Diabetes mellitus without complication ([place])   . Gastroenteritis 07/10/07  .  GERD 07/24/2006  . GRAVE'S DISEASE 01/01/2008  . History of hidradenitis suppurativa   . History of kidney stones   . History of thrush   . HIV DISEASE 07/24/2006   dx March 05  . HYPERTENSION 07/24/2006  . Hyperthyroidism 08/2006   Grave's Disease -diffuse radiotracer uptake 08/25/06 Thyroid scan-Cold nodule to R lower lobe of thyrorid  . Menometrorrhagia    hx of  . Nephrolithiasis   . 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[place])    METASTATIC PAPILLARY THYROID CARCINOMA/notes 04/12/2017  . Pneumonia 2005  . 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 Sarcoidosis 02/08/2007   dx as a teenager in [place] from abnl CXR. Completed 2 yrs Prednisone after lung bx confirmation. No symptoms since then.  . Thyroid cancer ([place])   . THYROID NODULE, RIGHT 02/08/2007     Social History   Social History  . Marital status: Single    Spouse name: N/A  . Number of children: 1  . Years of education: N/A   Occupational History  . [employer]   Social History Main Topics  . Smoking status: Former Smoker    Packs/day: 0.50    Years: 15.00    Types: Cigarettes    Start date:  04/12/2017  . Smokeless tobacco: Never Used  . Alcohol use No  . Drug use: No  . Sexual activity: Not Currently     Comment: declined condoms   Other Topics Concern  . Not on file   Social History Narrative  . No narrative on file    Past Surgical History:  Procedure Laterality Date  . BREAST SURGERY  1997   Breast Reduction   . CYSTOSCOPY W/ URETERAL STENT REMOVAL  11/09/2012   Procedure: CYSTOSCOPY WITH STENT REMOVAL;  Surgeon: Alexis Frock, MD;  Location: [facility];  Service: Urology;  Laterality: Right;  . CYSTOSCOPY WITH RETROGRADE PYELOGRAM, URETEROSCOPY AND STENT PLACEMENT  11/09/2012   Procedure: CYSTOSCOPY WITH RETROGRADE PYELOGRAM, URETEROSCOPY AND STENT PLACEMENT;  Surgeon: Alexis Frock, MD;  Location: [facility];  Service: Urology;  Laterality: Left;  LEFT URETEROSCOPY, STONE MANIPULATION,  left STENT exchange   . CYSTOSCOPY WITH STENT PLACEMENT  10/02/2012   Procedure: CYSTOSCOPY WITH STENT PLACEMENT;  Surgeon: Alexis Frock, MD;  Location: [facility];  Service: Urology;  Laterality: Left;  . DILATION AND CURETTAGE OF UTERUS  Feb 2004   s/p for 1st trimester nonviable pregnancy  . EYE SURGERY     sty under eyelid  . INCISE AND DRAIN ABCESS  Nov 03   s/p I &D for righ inframmary fold hidradenitis  . INCISION AND DRAINAGE PERITONSILLAR ABSCESS  Mar 03  . IRRIGATION AND DEBRIDEMENT ABSCESS  01/31/2012   Procedure: IRRIGATION AND DEBRIDEMENT ABSCESS;  Surgeon: Shann Medal, MD;  Location: [facility];  Service: General;  Laterality: Right;  right breast and axilla   . NEPHROLITHOTOMY  10/02/2012   Procedure: NEPHROLITHOTOMY PERCUTANEOUS;  Surgeon: Alexis Frock, MD;  Location: [facility];  Service: Urology;  Laterality: Right;  First Stage Percutaneous Nephrolithotomy with Surgeon Access, Left Ureteral Stent    . NEPHROLITHOTOMY  10/04/2012   Procedure: NEPHROLITHOTOMY PERCUTANEOUS SECOND LOOK;  Surgeon: Alexis Frock, MD;  Location: [facility];  Service: Urology;  Laterality: Right;     . NEPHROLITHOTOMY  10/08/2012   Procedure: NEPHROLITHOTOMY PERCUTANEOUS;  Surgeon: Alexis Frock, MD;  Location: [facility];  Service: Urology;  Laterality: Right;  THIRD STAGE, nephrostomy tube exchange x 2  . NEPHROLITHOTOMY  10/11/2012   Procedure: NEPHROLITHOTOMY PERCUTANEOUS SECOND LOOK;  Surgeon: Alexis Frock, MD;  Location: [facility];  Service: Urology;  Laterality: Right;  RIGHT 4 STAGE PERCUTANOUS NEPHROLITHOTOMY, right URETEROSCOPY WITH HOLMIUM LASER   . RADICAL NECK DISSECTION  04/12/2017   limited/notes 04/12/2017  . RADICAL NECK DISSECTION N/A 04/12/2017   Procedure: RADICAL NECK DISSECTION;  Surgeon: Melida Quitter, MD;  Location: [place];  Service: ENT;  Laterality: N/A;  limited neck dissection 2 hours total  . [place]  1994  . THYROIDECTOMY  04/12/2017   completion/notes 04/12/2017  . THYROIDECTOMY  N/A 04/12/2017   Procedure: THYROIDECTOMY;  Surgeon: Melida Quitter, MD;  Location: [facility];  Service: ENT;  Laterality: N/A;  Completion Thyroidectomy  . TOTAL THYROIDECTOMY  2010    Family History  Problem Relation Age of Onset  . Hypertension Mother   . Cancer Mother        throat  . Hypertension Father   . Cancer Other        FH of Breast Cancer  . Cancer Other   . Heart disease Other   . Hypertension Other   . Stroke Other        Grandparent  . Kidney disease Other        Grandparent  .  Diabetes Other        FH of Diabetes  . Hypertension Sister   . Cancer Maternal Uncle        Stomach Cancer, Lung CA    Allergies  Allergen Reactions  . Tape Rash    Rash   . Genvoya [Elviteg-Cobic-Emtricit-Tenofaf] Hives  . Lisinopril Cough  . Xylocaine [Lidocaine]     Unknown , patient states she has had lidocaine for IV starts and at the dentist with no problems  . Valsartan Cough    Pt states she tolerates medicine now    Current Outpatient Prescriptions on File Prior to Visit  Medication Sig Dispense Refill  . albuterol (PROVENTIL HFA;VENTOLIN HFA) 108 (90 Base) MCG/ACT inhaler Inhale 2 puffs into the lungs every 6 (six) hours as needed for wheezing or shortness of breath. 1 Inhaler 5  . amLODipine (NORVASC) 10 MG tablet Take 1 tablet (10 mg total) by mouth daily. 90 tablet 0  . glucose blood (ONETOUCH VERIO) test strip USE TO CHECK BLOOD SUGAR DAILY AND PRN (Patient taking differently: 1 each by Other route daily. ) 100 each 4  . hydrALAZINE (APRESOLINE) 25 MG tablet Take 1 tablet (25 mg total) by mouth 3 (three) times daily. 90 tablet 1  . HYDROcodone-acetaminophen (NORCO/VICODIN) 5-325 MG tablet Take 1-2 tablets by mouth every 6 (six) hours as needed for moderate pain. 30 tablet 0  . levothyroxine (SYNTHROID, LEVOTHROID) 200 MCG tablet TAKE 1 TABLET (200 MCG TOTAL) BY MOUTH DAILY. 30 tablet 5  . losartan-hydrochlorothiazide (HYZAAR) 100-25 MG tablet Take 1 tablet by mouth  daily. 90 tablet 3  . metFORMIN (GLUCOPHAGE) 500 MG tablet TAKE 1 TABLET (500 MG TOTAL) BY MOUTH 2 (TWO) TIMES DAILY WITH A MEAL. 60 tablet 5  . metoprolol succinate (TOPROL-XL) 50 MG 24 hr tablet Take 1 tablet (50 mg total) by mouth daily. Take with or immediately following a meal. 90 tablet 3  . mometasone (NASONEX) 50 MCG/ACT nasal spray Place 2 sprays into the nose daily. (Patient taking differently: Place 2 sprays into the nose daily as needed (for seasonal allergies.). ) 17 g 3  . Multiple Vitamin (MULTIVITAMIN) tablet Take 1 tablet by mouth daily.    . ODEFSEY 200-25-25 MG TABS tablet TAKE 1 TABLET BY MOUTH DAILY. 30 tablet 5  . ONE TOUCH LANCETS MISC USE TO CHECK BLOOD SUGAR DAILY AND PRN (Patient taking differently: 1 each by Other route daily. ) 100 each 4  . pantoprazole (PROTONIX) 40 MG tablet Take 40 mg by mouth daily as needed (for indigestion/acid reflux.).      No current facility-administered medications on file prior to visit.     BP (!) 138/92   Pulse 66   Temp 98 F (36.7 C) (Oral)   Ht 5\' 5"  (1.651 m)   Wt 242 lb 12.8 oz (110.1 kg)   LMP 03/31/2014   SpO2 98%   BMI 40.40 kg/m    Objective:   Physical Exam  Constitutional: She appears well-nourished.  Neck: Neck supple.  Cardiovascular: Normal rate and regular rhythm.   Pulmonary/Chest: Effort normal and breath sounds normal.  Skin: Skin is warm and dry.          Assessment & Plan:

## 2017-04-26 NOTE — Patient Instructions (Signed)
Start atorvastatin 20 mg for high cholesterol and prevention of heart disease. Take 1 tablet by mouth every evening at bedtime.  Continue hydralazine, metoprolol succinate, losartan-HCTZ. Start taking the hydralazine three times daily as prescribed.  Check your blood pressure daily, around the same time of day, for the next 2 weeks.  Ensure that you have rested for 30 minutes prior to checking your blood pressure. Record your readings and I'll call you for those readings in 2 weeks.  Schedule a lab only appointment in 6 weeks to recheck your cholesterol.  It was a pleasure to see you today!

## 2017-04-26 NOTE — Assessment & Plan Note (Signed)
Not taking Amlodipine as she thought this was discontinued. Taking hydralazine twice daily, not three times daily; compliant to other BP medications. BP today overall improved but still above goal. Will have her start her hydralazine TID, continue other meds. Will call her for BP readings in 2 weeks, if above goal then will add back Amlodipine.

## 2017-04-26 NOTE — Assessment & Plan Note (Signed)
A1C checked prior to surgery and about the same from April 2018. Continue Metformin BID. Managed on ARB. Atorvastatin added today given ASCVD risk score of 31%. Recheck A1C in 6 months.

## 2017-05-03 ENCOUNTER — Telehealth: Payer: Self-pay | Admitting: Licensed Clinical Social Worker

## 2017-05-03 NOTE — Telephone Encounter (Signed)
Left message introducing self to patient and role of the [facility].  Asked for return phone call notifying of desire for an appointment,  Sande Rives, [facility]

## 2017-05-11 ENCOUNTER — Telehealth: Payer: Self-pay | Admitting: Licensed Clinical Social Worker

## 2017-05-11 NOTE — Telephone Encounter (Signed)
  Left message for patient introducing self and explaining [facility] role and asked for return phone call to inform of desire for appointment.     Sande Rives, [facility]

## 2017-05-21 ENCOUNTER — Other Ambulatory Visit: Payer: Self-pay | Admitting: Internal Medicine

## 2017-05-21 DIAGNOSIS — B20 Human immunodeficiency virus [HIV] disease: Secondary | ICD-10-CM

## 2017-05-30 ENCOUNTER — Other Ambulatory Visit (HOSPITAL_COMMUNITY): Payer: Self-pay | Admitting: Otolaryngology

## 2017-05-30 DIAGNOSIS — C73 Malignant neoplasm of thyroid gland: Secondary | ICD-10-CM

## 2017-06-05 ENCOUNTER — Encounter (HOSPITAL_COMMUNITY)
Admission: RE | Admit: 2017-06-05 | Discharge: 2017-06-05 | Disposition: A | Discharge status: Home / Self Care | Payer: Managed Care, Other (non HMO) | Source: Ambulatory Visit | Attending: Otolaryngology | Admitting: Otolaryngology

## 2017-06-05 DIAGNOSIS — C73 Malignant neoplasm of thyroid gland: Secondary | ICD-10-CM | POA: Diagnosis not present

## 2017-06-05 MED ORDER — THYROTROPIN ALFA 1.1 MG IM SOLR
0.9000 mg | INTRAMUSCULAR | Status: AC
  Administered 2017-06-05: 0.9 mg via INTRAMUSCULAR

## 2017-06-05 MED ORDER — THYROTROPIN ALFA 1.1 MG IM SOLR
INTRAMUSCULAR | Status: AC
  Filled 2017-06-05: qty 0.9

## 2017-06-06 ENCOUNTER — Encounter (HOSPITAL_COMMUNITY)
Admission: RE | Admit: 2017-06-06 | Discharge: 2017-06-06 | Disposition: A | Discharge status: Home / Self Care | Payer: Managed Care, Other (non HMO) | Source: Ambulatory Visit | Attending: Otolaryngology | Admitting: Otolaryngology

## 2017-06-06 DIAGNOSIS — C73 Malignant neoplasm of thyroid gland: Secondary | ICD-10-CM | POA: Diagnosis not present

## 2017-06-06 MED ORDER — THYROTROPIN ALFA 1.1 MG IM SOLR
0.9000 mg | INTRAMUSCULAR | Status: AC
  Administered 2017-06-06: 0.9 mg via INTRAMUSCULAR

## 2017-06-07 ENCOUNTER — Encounter (HOSPITAL_COMMUNITY)
Admission: RE | Admit: 2017-06-07 | Discharge: 2017-06-07 | Disposition: A | Discharge status: Home / Self Care | Payer: Managed Care, Other (non HMO) | Source: Ambulatory Visit | Attending: Otolaryngology | Admitting: Otolaryngology

## 2017-06-07 DIAGNOSIS — C73 Malignant neoplasm of thyroid gland: Secondary | ICD-10-CM | POA: Diagnosis not present

## 2017-06-07 LAB — HCG, SERUM, QUALITATIVE: PREG SERUM: NEGATIVE

## 2017-06-07 MED ORDER — SODIUM IODIDE I 131 CAPSULE
4.1000 | Freq: Once | INTRAVENOUS | Status: AC | PRN
  Administered 2017-06-07: 4.1 via ORAL

## 2017-06-08 ENCOUNTER — Other Ambulatory Visit: Payer: Self-pay | Admitting: Primary Care

## 2017-06-08 ENCOUNTER — Other Ambulatory Visit (INDEPENDENT_AMBULATORY_CARE_PROVIDER_SITE_OTHER): Payer: Managed Care, Other (non HMO)

## 2017-06-08 ENCOUNTER — Encounter: Payer: Self-pay | Admitting: Primary Care

## 2017-06-08 DIAGNOSIS — E785 Hyperlipidemia, unspecified: Secondary | ICD-10-CM

## 2017-06-08 LAB — HEPATIC FUNCTION PANEL
ALBUMIN: 3.7 g/dL (ref 3.5–5.2)
ALK PHOS: 89 U/L (ref 39–117)
ALT: 26 U/L (ref 0–35)
AST: 21 U/L (ref 0–37)
Bilirubin, Direct: 0.1 mg/dL (ref 0.0–0.3)
TOTAL PROTEIN: 7.9 g/dL (ref 6.0–8.3)
Total Bilirubin: 0.3 mg/dL (ref 0.2–1.2)

## 2017-06-08 LAB — LIPID PANEL
CHOLESTEROL: 119 mg/dL (ref 0–200)
HDL: 35.4 mg/dL — AB (ref >39.00)
LDL CALC: 68 mg/dL (ref 0–99)
NonHDL: 83.18
TRIGLYCERIDES: 74 mg/dL (ref 0.0–149.0)
Total CHOL/HDL Ratio: 3
VLDL: 14.8 mg/dL (ref 0.0–40.0)

## 2017-06-08 MED ORDER — GLUCOSE BLOOD VI STRP: ORAL_STRIP | 5 refills | Status: DC

## 2017-06-08 MED ORDER — ONETOUCH LANCETS MISC: 5 refills | Status: DC

## 2017-06-08 NOTE — Telephone Encounter (Signed)
Received walk-in request for patient. She needed refill of test strips and needles.   According to patient not needles, she needed more lancets.  Will refill as requested.

## 2017-06-09 ENCOUNTER — Encounter (HOSPITAL_COMMUNITY)
Admission: RE | Admit: 2017-06-09 | Discharge: 2017-06-09 | Disposition: A | Discharge status: Home / Self Care | Payer: Managed Care, Other (non HMO) | Source: Ambulatory Visit | Attending: Otolaryngology | Admitting: Otolaryngology

## 2017-06-09 ENCOUNTER — Other Ambulatory Visit: Payer: Self-pay

## 2017-06-09 DIAGNOSIS — C73 Malignant neoplasm of thyroid gland: Secondary | ICD-10-CM | POA: Diagnosis not present

## 2017-06-22 ENCOUNTER — Other Ambulatory Visit: Payer: Self-pay | Admitting: Otolaryngology

## 2017-06-22 DIAGNOSIS — C801 Malignant (primary) neoplasm, unspecified: Secondary | ICD-10-CM

## 2017-06-23 ENCOUNTER — Other Ambulatory Visit: Payer: Self-pay | Admitting: Primary Care

## 2017-06-23 DIAGNOSIS — J309 Allergic rhinitis, unspecified: Secondary | ICD-10-CM

## 2017-06-28 ENCOUNTER — Other Ambulatory Visit: Payer: Self-pay

## 2017-06-29 ENCOUNTER — Ambulatory Visit
Admission: RE | Admit: 2017-06-29 | Discharge: 2017-06-29 | Disposition: A | Discharge status: Home / Self Care | Payer: Managed Care, Other (non HMO) | Source: Ambulatory Visit | Attending: Otolaryngology | Admitting: Otolaryngology

## 2017-06-29 DIAGNOSIS — C801 Malignant (primary) neoplasm, unspecified: Secondary | ICD-10-CM

## 2017-07-03 ENCOUNTER — Other Ambulatory Visit: Payer: Self-pay | Admitting: Internal Medicine

## 2017-07-03 DIAGNOSIS — I10 Essential (primary) hypertension: Secondary | ICD-10-CM

## 2017-07-03 NOTE — Telephone Encounter (Signed)
Ok to refill? Electronically refill request for hydrALAZINE (APRESOLINE) 25 MG tablet  This medication was first prescribed by Dr Silvio Pate on 04/05/2017 in a telephone encounter. Last seen on 04/26/2017

## 2017-07-03 NOTE — Telephone Encounter (Signed)
Please check on blood pressure readings. Also, is she taking amlodipine 10 mg, losartan-HCTZ, metoprolol succinate, and hydralazine? How often is she taking hydralazine?

## 2017-07-04 NOTE — Telephone Encounter (Signed)
Message left for patient to return my call.  

## 2017-07-07 MED ORDER — HYDRALAZINE HCL 25 MG PO TABS: 25.0000 mg | ORAL_TABLET | Freq: Three times a day (TID) | ORAL | 1 refills | Status: DC

## 2017-07-07 NOTE — Telephone Encounter (Signed)
Spoken and notified patient of [name]'s comments. Patient verbalized understanding. 

## 2017-07-07 NOTE — Telephone Encounter (Signed)
Please have patient continue all medications, needs to take hydralazine three times daily for BP above 135/90. Please have her start this and continue to monitor blood pressure. Please schedule her for blood pressure follow-up in 3 months, have her call sooner for blood pressures at or greater than 135/90 with hydralazine TID.

## 2017-07-07 NOTE — Telephone Encounter (Signed)
Spoken to patient. She have not check her blood pressure lately. The last time it was in 140s/135s over 90s-80s.  Yes, patient is taking amlodipine 10 mg, losartan-HCTZ 100-25 mg, metoprolol succinate 50 mg, and hydralazine 25 mg.  Patient is taking hydralazine 25 mg twice a day not 3 times.

## 2017-07-19 ENCOUNTER — Other Ambulatory Visit: Payer: Self-pay | Admitting: Internal Medicine

## 2017-08-17 ENCOUNTER — Other Ambulatory Visit: Payer: Self-pay | Admitting: Internal Medicine

## 2017-08-17 ENCOUNTER — Other Ambulatory Visit: Payer: Self-pay | Admitting: Primary Care

## 2017-08-18 ENCOUNTER — Ambulatory Visit: Payer: Self-pay

## 2017-08-18 ENCOUNTER — Telehealth: Payer: Self-pay | Admitting: *Deleted

## 2017-08-18 NOTE — Telephone Encounter (Signed)
Copied from [place] [id number]. Topic: Referral - Request >> Aug 17, 2017  9:16 AM Conception Chancy, NT wrote: Reason for CRM: pt feels like her thyroid level is off, shes itching, her appetite, shes very tired. Pt would like to be referred somewhere and would also like [facility] nurse to contact her.

## 2017-08-18 NOTE — Telephone Encounter (Signed)
Spoken to patient. She stated that she would a referral to Endo. She saw one years ago and due to the recent surgery, she was told she should see her Endo.

## 2017-08-18 NOTE — Telephone Encounter (Signed)
She needs to make an appt here to have her thyroid levels checked before being referred to endo.

## 2017-08-21 NOTE — Telephone Encounter (Signed)
Spoken to patient and schedule appt on 09/01/2017

## 2017-08-28 ENCOUNTER — Other Ambulatory Visit: Payer: Self-pay | Admitting: Primary Care

## 2017-09-01 ENCOUNTER — Ambulatory Visit (INDEPENDENT_AMBULATORY_CARE_PROVIDER_SITE_OTHER): Payer: Managed Care, Other (non HMO) | Admitting: Primary Care

## 2017-09-01 ENCOUNTER — Encounter: Payer: Self-pay | Admitting: Primary Care

## 2017-09-01 ENCOUNTER — Other Ambulatory Visit (INDEPENDENT_AMBULATORY_CARE_PROVIDER_SITE_OTHER): Payer: Managed Care, Other (non HMO)

## 2017-09-01 VITALS — BP 146/92 | HR 69 | Temp 98.6°F | Ht 65.0 in | Wt 252.8 lb

## 2017-09-01 DIAGNOSIS — Z23 Encounter for immunization: Secondary | ICD-10-CM | POA: Diagnosis not present

## 2017-09-01 DIAGNOSIS — E890 Postprocedural hypothyroidism: Secondary | ICD-10-CM | POA: Diagnosis not present

## 2017-09-01 LAB — BASIC METABOLIC PANEL
BUN: 19 mg/dL (ref 6–23)
CO2: 30 meq/L (ref 19–32)
Calcium: 9.8 mg/dL (ref 8.4–10.5)
Chloride: 101 mEq/L (ref 96–112)
Creatinine, Ser: 1.06 mg/dL (ref 0.40–1.20)
GFR: 70.77 mL/min (ref >60.00)
GLUCOSE: 115 mg/dL — AB (ref 70–99)
POTASSIUM: 3.7 meq/L (ref 3.5–5.1)
SODIUM: 140 meq/L (ref 135–145)

## 2017-09-01 LAB — TSH: TSH: 0.14 u[IU]/mL — ABNORMAL LOW (ref 0.35–4.50)

## 2017-09-01 LAB — T4, FREE: Free T4: 1.56 ng/dL (ref 0.60–1.60)

## 2017-09-01 NOTE — Addendum Note (Signed)
Addended by: Jacqualin Combes on: 09/01/2017 04:15 PM   Modules accepted: Orders

## 2017-09-01 NOTE — Assessment & Plan Note (Addendum)
TSH in April 2018 unremarkable. Repeat TSH with free T4 pending. Referral placed to endocrinology for further management as requested. Numerous labs completed throughout the year, overall unremarkable.  Recheck BMP to ensure no severe hyperglycemia.

## 2017-09-01 NOTE — Patient Instructions (Signed)
Complete lab work prior to leaving today. I will notify you of your results once received.   Stop by the front desk and speak with either Rosaria Ferries or Shirlean Mylar regarding your referral to endocrinology.  Schedule a follow up visit with me in late January 2019. We will do lab work at that time.  It was a pleasure to see you today!

## 2017-09-01 NOTE — Progress Notes (Signed)
Subjective:    Patient ID: [name], female    DOB: 1965-12-25, 51 y.o.   MRN: [id number]  HPI  Ms. Mendosa is a 51 year old female with a history of essential hypertension, postsurgical hypothyroidism (2010), papillar thyroid carcinoma (2018), HIV who presents today to request a referral to endocrinology.    Her last TSH was 1.59 in April 2018. She is currently managed on levothyroxine 200 mcg. Since her last visit she's noticed insomnia, dry mouth, itchy skin, difficulty with short term memory that has been present for the past 2 months. This is exactly how she felt when she was initially diagnosed with over active thyroid years ago.  She was found to have a papillar thyroid carcinoma per ENT and underwent thyroidectomy in June 2018.  She underwent repeat thyroid ultrasound in September 2018 which was negative for abnormal tissue or recurrent carcinoma. She did have a non pathologically enlarged lymph node.  She's been checking her BP at home which is running 130's/80's, over the last several days has noticed higher numbers.    BP Readings from Last 3 Encounters:  09/01/17 (!) 146/92  04/26/17 (!) 138/92  04/14/17 (!) 158/90     Review of Systems  Constitutional: Positive for fatigue.  Respiratory: Negative for shortness of breath.   Cardiovascular: Negative for chest pain.  Skin:       Pruritus  Psychiatric/Behavioral: Positive for sleep disturbance.       Difficulty with short-term memory       Past Medical History:  Diagnosis Date  . Anemia    Normocytic  . Anxiety   . Asthma   . Blood dyscrasia   . Bronchitis 2005  . 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 Diabetes mellitus without complication ([place])   . Gastroenteritis 07/10/07  . GERD 07/24/2006  . GRAVE'S DISEASE 01/01/2008  . History of hidradenitis suppurativa   . History of kidney stones   . History of thrush   . HIV DISEASE 07/24/2006   dx March 05  . HYPERTENSION 07/24/2006  . Hyperthyroidism  08/2006   Grave's Disease -diffuse radiotracer uptake 08/25/06 Thyroid scan-Cold nodule to R lower lobe of thyrorid  . Menometrorrhagia    hx of  . Nephrolithiasis   . 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 Pneumonia 2005  . 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 Sarcoidosis 02/08/2007   dx as a teenager in [place] from abnl CXR. Completed 2 yrs Prednisone after lung bx confirmation. No symptoms since then.  . Thyroid cancer ([place])   . THYROID NODULE, RIGHT 02/08/2007     Social History   Socioeconomic History  . Marital status: Single    Spouse name: Not on file  . Number of children: 1  . Years of education: Not on file  . Highest education level: Not on file  Social Needs  . Financial resource strain: Not on file  . Food insecurity - worry: Not on file  . Food insecurity - inability: Not on file  . Transportation needs - medical: Not on file  . Transportation needs - non-medical: Not on file  Occupational History  . Occupation: Finance-Payroll    Employer: [employer]  Tobacco Use  . Smoking status: Former Smoker    Packs/day: 0.50    Years: 15.00    Pack years: 7.50    Types: Cigarettes    Start date: 04/12/2017  . Smokeless tobacco: Never Used  Substance and Sexual  Activity  . Alcohol use: No    Alcohol/week: 0.0 oz  . Drug use: No  . Sexual activity: Not Currently    Comment: declined condoms  Other Topics Concern  . Not on file  Social History Narrative  . Not on file    Past Surgical History:  Procedure Laterality Date  . BREAST SURGERY  1997   Breast Reduction   . CYSTOSCOPY WITH RETROGRADE PYELOGRAM, URETEROSCOPY AND STENT PLACEMENT Left 11/09/2012   Performed by Alexis Frock, [facility] at [facility]S  . CYSTOSCOPY WITH STENT PLACEMENT Left 10/02/2012   Performed by Alexis Frock, MD at [facility] ORS  . CYSTOSCOPY WITH STENT REMOVAL Right 11/09/2012   Performed by Alexis Frock, MD at [facility] ORS    . DILATION AND CURETTAGE OF UTERUS  Feb 2004   s/p for 1st trimester nonviable pregnancy  . EYE SURGERY     sty under eyelid  . INCISE AND DRAIN ABCESS  Nov 03   s/p I &D for righ inframmary fold hidradenitis  . INCISION AND DRAINAGE PERITONSILLAR ABSCESS  Mar 03  . IRRIGATION AND DEBRIDEMENT ABSCESS Right 01/31/2012   Performed by Shann Medal, MD at [facility] ORS  . NEPHROLITHOTOMY PERCUTANEOUS Right 10/08/2012   Performed by Alexis Frock, MD at [facility] ORS  . NEPHROLITHOTOMY PERCUTANEOUS Right 10/02/2012   Performed by Alexis Frock, MD at [facility] ORS  . NEPHROLITHOTOMY PERCUTANEOUS SECOND LOOK Right 10/11/2012   Performed by Alexis Frock, MD at [facility] ORS  . NEPHROLITHOTOMY PERCUTANEOUS SECOND LOOK Right 10/04/2012   Performed by Alexis Frock, MD at [facility] ORS  . RADICAL NECK DISSECTION  04/12/2017   limited/notes 04/12/2017  . RADICAL NECK DISSECTION N/A 04/12/2017   Performed by Melida Quitter, MD at [place]  . [place]  1994  . THYROIDECTOMY  04/12/2017   completion/notes 04/12/2017  . THYROIDECTOMY N/A 04/12/2017   Performed by Melida Quitter, MD at [place]  . TOTAL THYROIDECTOMY  2010  . URETERAL CATHETER OR STENT PLACEMENT Right 10/11/2012   Performed by [facility]is Frock, MD [facility] ORS    Family History  Problem Relation Age of Onset  . Hypertension Mother   . Cancer Mother        throat  . Hypertension Father   . Cancer Other        FH of Breast Cancer  . Cancer Other   . Heart disease Other   . Hypertension Other   . Stroke Other        Grandparent  . Kidney disease Other        Grandparent  . Diabetes Other        FH of Diabetes  . Hypertension Sister   . Cancer Maternal Uncle        Stomach Cancer, Lung CA    Allergies  Allergen Reactions  . Tape Rash    Rash   . Genvoya [Elviteg-Cobic-Emtricit-Tenofaf] Hives  . Lisinopril Cough  . Xylocaine [Lidocaine]     Unknown , patient states she has had lidocaine for IV starts and at the dentist with no problems  . Valsartan  Cough    Pt states she tolerates medicine now    Current Outpatient Medications on File Prior to Visit  Medication Sig Dispense Refill  . albuterol (PROVENTIL HFA;VENTOLIN HFA) 108 (90 Base) MCG/ACT inhaler Inhale 2 puffs into the lungs every 6 (six) hours as needed for wheezing or shortness of breath. 1 Inhaler 5  . amLODipine (NORVASC) 10 MG tablet Take  1 tablet (10 mg total) by mouth daily. 90 tablet 0  . atorvastatin (LIPITOR) 20 MG tablet Take 1 tablet by mouth every evening for high cholesterol. 90 tablet 3  . glucose blood (ONETOUCH VERIO) test strip Use as instructed to test blood sugar daily 100 each 5  . hydrALAZINE (APRESOLINE) 25 MG tablet Take 1 tablet (25 mg total) by mouth 3 (three) times daily. 270 tablet 1  . HYDROcodone-acetaminophen (NORCO/VICODIN) 5-325 MG tablet Take 1-2 tablets by mouth every 6 (six) hours as needed for moderate pain. 30 tablet 0  . levothyroxine (SYNTHROID, LEVOTHROID) 200 MCG tablet TAKE 1 TABLET (200 MCG TOTAL) BY MOUTH DAILY. 30 tablet 2  . losartan-hydrochlorothiazide (HYZAAR) 100-25 MG tablet Take 1 tablet by mouth daily. 90 tablet 3  . metFORMIN (GLUCOPHAGE) 500 MG tablet TAKE 1 TABLET (500 MG TOTAL) BY MOUTH 2 (TWO) TIMES DAILY WITH A MEAL. 60 tablet 1  . metoprolol succinate (TOPROL-XL) 50 MG 24 hr tablet Take 1 tablet (50 mg total) by mouth daily. Take with or immediately following a meal. 90 tablet 3  . mometasone (NASONEX) 50 MCG/ACT nasal spray PLACE 2 SPRAYS INTO THE NOSE DAILY. 17 g 3  . Multiple Vitamin (MULTIVITAMIN) tablet Take 1 tablet by mouth daily.    . ODEFSEY 200-25-25 MG TABS tablet TAKE 1 TABLET BY MOUTH DAILY. 30 tablet 5  . ONE TOUCH LANCETS MISC Use as instructed to test blood sugar daily 200 each 5  . pantoprazole (PROTONIX) 40 MG tablet Take 40 mg by mouth daily as needed (for indigestion/acid reflux.).      No current facility-administered medications on file prior to visit.     BP (!) 146/92   Pulse 69   Temp 98.6 F  (37 C) (Oral)   Ht 5\' 5"  (1.651 m)   Wt 252 lb 12.8 oz (114.7 kg)   LMP 03/31/2014 Comment: Negative Serum HCG 06/07/17  SpO2 97%   BMI 42.07 kg/m    Objective:   Physical Exam  Constitutional: She appears well-nourished.  Neck:  No masses noted to anterior neck  Cardiovascular: Normal rate and regular rhythm.  Pulmonary/Chest: Effort normal and breath sounds normal.  Skin: Skin is warm and dry.          Assessment & Plan:

## 2017-09-04 ENCOUNTER — Other Ambulatory Visit: Payer: Self-pay | Admitting: Primary Care

## 2017-09-04 DIAGNOSIS — E890 Postprocedural hypothyroidism: Secondary | ICD-10-CM

## 2017-09-04 MED ORDER — LEVOTHYROXINE SODIUM 175 MCG PO TABS: ORAL_TABLET | ORAL | 0 refills | Status: DC

## 2017-09-19 ENCOUNTER — Other Ambulatory Visit: Payer: Self-pay

## 2017-09-20 ENCOUNTER — Other Ambulatory Visit: Payer: Self-pay

## 2017-09-28 ENCOUNTER — Other Ambulatory Visit: Payer: Self-pay | Admitting: Primary Care

## 2017-09-28 DIAGNOSIS — R0602 Shortness of breath: Secondary | ICD-10-CM

## 2017-10-03 ENCOUNTER — Ambulatory Visit: Payer: Self-pay | Admitting: Internal Medicine

## 2017-10-12 ENCOUNTER — Telehealth: Payer: Self-pay

## 2017-10-12 ENCOUNTER — Other Ambulatory Visit (INDEPENDENT_AMBULATORY_CARE_PROVIDER_SITE_OTHER): Payer: Managed Care, Other (non HMO)

## 2017-10-12 DIAGNOSIS — E890 Postprocedural hypothyroidism: Secondary | ICD-10-CM | POA: Diagnosis not present

## 2017-10-12 LAB — T4, FREE: FREE T4: 1.03 ng/dL (ref 0.60–1.60)

## 2017-10-12 NOTE — Telephone Encounter (Signed)
Okay ---will evaluate at visit tomorrow

## 2017-10-12 NOTE — Telephone Encounter (Signed)
Pt came for labs and noted that for over 1 month pt has been tired and SOB upon exertion; started after stopped smoking; after rest breathing is OK. Today h/a pain level 5; no CP or dizziness.pulse ox 90 and then went to 95 on room air. Pt wants to schedule appt 10/13/17; Gentry Fitz NP said OK for appt on 10/13/17. Pt scheduled with Dr Silvio Pate on 10/13/17 at 11 AM. If pt worsens prior to appt pt will go to UC. Pt plans to rest this afternoon. FYI to Dr Silvio Pate.

## 2017-10-13 ENCOUNTER — Ambulatory Visit: Payer: Managed Care, Other (non HMO) | Admitting: Internal Medicine

## 2017-10-13 ENCOUNTER — Ambulatory Visit (INDEPENDENT_AMBULATORY_CARE_PROVIDER_SITE_OTHER)
Admission: RE | Admit: 2017-10-13 | Discharge: 2017-10-13 | Disposition: A | Discharge status: Home / Self Care | Payer: Managed Care, Other (non HMO) | Source: Ambulatory Visit | Attending: Internal Medicine | Admitting: Internal Medicine

## 2017-10-13 ENCOUNTER — Encounter: Payer: Self-pay | Admitting: Internal Medicine

## 2017-10-13 VITALS — BP 126/90 | HR 70 | Temp 98.3°F | Resp 24 | Wt 248.0 lb

## 2017-10-13 DIAGNOSIS — R0609 Other forms of dyspnea: Secondary | ICD-10-CM | POA: Diagnosis not present

## 2017-10-13 LAB — COMPREHENSIVE METABOLIC PANEL
ALK PHOS: 82 U/L (ref 39–117)
ALT: 25 U/L (ref 0–35)
AST: 19 U/L (ref 0–37)
Albumin: 3.9 g/dL (ref 3.5–5.2)
BILIRUBIN TOTAL: 0.3 mg/dL (ref 0.2–1.2)
BUN: 24 mg/dL — ABNORMAL HIGH (ref 6–23)
CALCIUM: 9.2 mg/dL (ref 8.4–10.5)
CO2: 29 meq/L (ref 19–32)
Chloride: 102 mEq/L (ref 96–112)
Creatinine, Ser: 1.17 mg/dL (ref 0.40–1.20)
GFR: 63.12 mL/min (ref >60.00)
Glucose, Bld: 153 mg/dL — ABNORMAL HIGH (ref 70–99)
Potassium: 3.4 mEq/L — ABNORMAL LOW (ref 3.5–5.1)
Sodium: 139 mEq/L (ref 135–145)
Total Protein: 7.9 g/dL (ref 6.0–8.3)

## 2017-10-13 LAB — CBC
HCT: 34.9 % — ABNORMAL LOW (ref 36.0–46.0)
Hemoglobin: 11.3 g/dL — ABNORMAL LOW (ref 12.0–15.0)
MCHC: 32.3 g/dL (ref 30.0–36.0)
MCV: 83.8 fl (ref 78.0–100.0)
PLATELETS: 419 10*3/uL — AB (ref 150.0–400.0)
RBC: 4.16 Mil/uL (ref 3.87–5.11)
RDW: 16.3 % — ABNORMAL HIGH (ref 11.5–15.5)
WBC: 6.1 10*3/uL (ref 4.0–10.5)

## 2017-10-13 LAB — TSH: TSH: 0.22 u[IU]/mL — ABNORMAL LOW (ref 0.35–4.50)

## 2017-10-13 LAB — SEDIMENTATION RATE: Sed Rate: 93 mm/hr — ABNORMAL HIGH (ref 0–20)

## 2017-10-13 NOTE — Progress Notes (Signed)
Subjective:    Patient ID: [name], female    DOB: [birth year], 51 y.o.   MRN: [id number]  HPI Here due to shortness of breath  In the past month, has had increased DOE Started before then--but not alarming Related to stopping smoking before her thyroid surgery Tired with even walking from house to car Increasing over time  No cough No fever Does hear some wheezing at times No chest pain Slight palpitations Noticed weakening of her legs before even the DOE  Has proair inhaler--not really helpful but maybe feels better Sarcoidosis in 1995---prednisone for 1-2 years (then remitted) No known occupational exposures  Current Outpatient Medications on File Prior to Visit  Medication Sig Dispense Refill  . amLODipine (NORVASC) 10 MG tablet Take 1 tablet (10 mg total) by mouth daily. 90 tablet 0  . atorvastatin (LIPITOR) 20 MG tablet Take 1 tablet by mouth every evening for high cholesterol. 90 tablet 3  . glucose blood (ONETOUCH VERIO) test strip Use as instructed to test blood sugar daily 100 each 5  . hydrALAZINE (APRESOLINE) 25 MG tablet Take 1 tablet (25 mg total) by mouth 3 (three) times daily. 270 tablet 1  . levothyroxine (SYNTHROID, LEVOTHROID) 175 MCG tablet Take 1 tablet by mouth every morning on an empty stomach with a full glass of water. 90 tablet 0  . losartan-hydrochlorothiazide (HYZAAR) 100-25 MG tablet Take 1 tablet by mouth daily. 90 tablet 3  . metFORMIN (GLUCOPHAGE) 500 MG tablet TAKE 1 TABLET (500 MG TOTAL) BY MOUTH 2 (TWO) TIMES DAILY WITH A MEAL. 60 tablet 1  . metoprolol succinate (TOPROL-XL) 50 MG 24 hr tablet Take 1 tablet (50 mg total) by mouth daily. Take with or immediately following a meal. 90 tablet 3  . mometasone (NASONEX) 50 MCG/ACT nasal spray PLACE 2 SPRAYS INTO THE NOSE DAILY. 17 g 3  . Multiple Vitamin (MULTIVITAMIN) tablet Take 1 tablet by mouth daily.    . ODEFSEY 200-25-25 MG TABS tablet TAKE 1 TABLET BY MOUTH DAILY. 30 tablet 5  . ONE  TOUCH LANCETS MISC Use as instructed to test blood sugar daily 200 each 5  . pantoprazole (PROTONIX) 40 MG tablet Take 40 mg by mouth daily as needed (for indigestion/acid reflux.).     Marland Kitchen PROAIR RESPICLICK 390 (90 Base) MCG/ACT AEPB INHALE 2 PUFFS INTO THE LUNGS EVERY 6 HOURS AS NEEDED FOR WHEEZING OR SHORTNESS OF BREATH. 1 each 2   No current facility-administered medications on file prior to visit.     Allergies  Allergen Reactions  . Tape Rash    Rash   . Genvoya [Elviteg-Cobic-Emtricit-Tenofaf] Hives  . Lisinopril Cough  . Xylocaine [Lidocaine]     Unknown , patient states she has had lidocaine for IV starts and at the dentist with no problems  . Valsartan Cough    Pt states she tolerates medicine now    Past Medical History:  Diagnosis Date  . Anemia    Normocytic  . Anxiety   . Asthma   . Blood dyscrasia   . Bronchitis 2005  . 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 Diabetes mellitus without complication ([place])   . Gastroenteritis 07/10/07  . GERD 07/24/2006  . GRAVE'S DISEASE 01/01/2008  . History of hidradenitis suppurativa   . History of kidney stones   . History of thrush   . HIV DISEASE 07/24/2006   dx March 05  . HYPERTENSION 07/24/2006  . Hyperthyroidism 08/2006   Grave's Disease -diffuse radiotracer uptake 08/25/06 Thyroid  scan-Cold nodule to R lower lobe of thyrorid  . Menometrorrhagia    hx of  . Nephrolithiasis   . 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[place])    METASTATIC PAPILLARY THYROID CARCINOMA/notes 04/12/2017  . Pneumonia 2005  . 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 Sarcoidosis 02/08/2007   dx as a teenager in [place] from abnl CXR. Completed 2 yrs Prednisone after lung bx confirmation. No symptoms since then.  . Thyroid cancer ([place])   . THYROID NODULE, RIGHT 02/08/2007    Past Surgical History:  Procedure Laterality Date  . BREAST SURGERY  1997   Breast Reduction   . CYSTOSCOPY W/ URETERAL STENT REMOVAL  11/09/2012   Procedure:  CYSTOSCOPY WITH STENT REMOVAL;  Surgeon: Alexis Frock, MD;  Location: [facility] ORS;  Service: Urology;  Laterality: Right;  . CYSTOSCOPY WITH RETROGRADE PYELOGRAM, URETEROSCOPY AND STENT PLACEMENT  11/09/2012   Procedure: CYSTOSCOPY WITH RETROGRADE PYELOGRAM, URETEROSCOPY AND STENT PLACEMENT;  Surgeon: Alexis Frock, MD;  Location: [facility] ORS;  Service: Urology;  Laterality: Left;  LEFT URETEROSCOPY, STONE MANIPULATION, left STENT exchange   . CYSTOSCOPY WITH STENT PLACEMENT  10/02/2012   Procedure: CYSTOSCOPY WITH STENT PLACEMENT;  Surgeon: Alexis Frock, MD;  Location: [facility] ORS;  Service: Urology;  Laterality: Left;  . DILATION AND CURETTAGE OF UTERUS  Feb 2004   s/p for 1st trimester nonviable pregnancy  . EYE SURGERY     sty under eyelid  . INCISE AND DRAIN ABCESS  Nov 03   s/p I &D for righ inframmary fold hidradenitis  . INCISION AND DRAINAGE PERITONSILLAR ABSCESS  Mar 03  . IRRIGATION AND DEBRIDEMENT ABSCESS  01/31/2012   Procedure: IRRIGATION AND DEBRIDEMENT ABSCESS;  Surgeon: Shann Medal, MD;  Location: [facility];  Service: General;  Laterality: Right;  right breast and axilla   . NEPHROLITHOTOMY  10/02/2012   Procedure: NEPHROLITHOTOMY PERCUTANEOUS;  Surgeon: Alexis Frock, MD;  Location: [facility] OR[facility];  Service: Urology;  Laterality: Right;  First Stage Percutaneous Nephrolithotomy with Surgeon Access, Left Ureteral Stent    . NEPHROLITHOTOMY  10/04/2012   Procedure: NEPHROLITHOTOMY PERCUTANEOUS SECOND LOOK;  Surgeon: Alexis Frock, MD;  Location: [facility] OR[facility];  Service: Urology;  Laterality: Right;     . NEPHROLITHOTOMY  10/08/2012   Procedure: NEPHROLITHOTOMY PERCUTANEOUS;  Surgeon: Alexis Frock, MD;  Location: [facility] OR[facility];  Service: Urology;  Laterality: Right;  THIRD STAGE, nephrostomy tube exchange x 2  . NEPHROLITHOTOMY  10/11/2012   Procedure: NEPHROLITHOTOMY PERCUTANEOUS SECOND LOOK;  Surgeon: Alexis Frock, MD;  Location: [facility];  Service: Urology;  Laterality: Right;  RIGHT 4 STAGE PERCUTANOUS  NEPHROLITHOTOMY, right URETEROSCOPY WITH HOLMIUM LASER   . RADICAL NECK DISSECTION  04/12/2017   limited/notes 04/12/2017  . RADICAL NECK DISSECTION N/A 04/12/2017   Procedure: RADICAL NECK DISSECTION;  Surgeon: Melida Quitter, MD;  Location: [place];  Service: ENT;  Laterality: N/A;  limited neck dissection 2 hours total  . [place]  1994  . THYROIDECTOMY  04/12/2017   completion/notes 04/12/2017  . THYROIDECTOMY N/A 04/12/2017   Procedure: THYROIDECTOMY;  Surgeon: Melida Quitter, MD;  Location: [place];  Service: ENT;  Laterality: N/A;  Completion Thyroidectomy  . TOTAL THYROIDECTOMY  2010    Family History  Problem Relation Age of Onset  . Hypertension Mother   . Cancer Mother        throat  . Heart disease Mother        stent  . Hypertension Father   . Cancer Other        FH  of Breast Cancer  . Cancer Other   . Heart disease Other   . Hypertension Other   . Stroke Other        Grandparent  . Kidney disease Other        Grandparent  . Diabetes Other        FH of Diabetes  . Hypertension Sister   . Cancer Maternal Uncle        Stomach Cancer, Lung CA  . Hypertension Brother   . Hypertension Sister     Social History   Socioeconomic History  . Marital status: Single    Spouse name: Not on file  . Number of children: 1  . Years of education: Not on file  . Highest education level: Not on file  Social Needs  . Financial resource strain: Not on file  . Food insecurity - worry: Not on file  . Food insecurity - inability: Not on file  . Transportation needs - medical: Not on file  . Transportation needs - non-medical: Not on file  Occupational History  . Occupation: Secondary school teacher  Tobacco Use  . Smoking status: Former Smoker    Packs/day: 0.50    Years: 15.00    Pack years: 7.50    Types: Cigarettes    Start date: 04/12/2017  . Smokeless tobacco: Never Used  Substance and Sexual Activity  . Alcohol use: No    Alcohol/week: 0.0 oz  . Drug use: No  . Sexual  activity: Not Currently    Comment: declined condoms  Other Topics Concern  . Not on file  Social History Narrative  . Not on file   Review of Systems Appetite is about the same Weight may be up some--since stopping smoking (actually fairly stable) No rash. Does have some boils in axilla--no change in decades     Objective:   Physical Exam  Constitutional: She appears well-developed. No distress.  Neck: No thyromegaly present.  Cardiovascular: Normal rate, regular rhythm, normal heart sounds and intact distal pulses. Exam reveals no gallop.  No murmur heard. Pulmonary/Chest: Effort normal and breath sounds normal. No respiratory distress. She has no wheezes. She has no rales.  Abdominal: Soft. She exhibits no distension. There is no tenderness. There is no rebound and no guarding.  Musculoskeletal: She exhibits no edema.  Lymphadenopathy:    She has no cervical adenopathy.  Skin: No rash noted.  Psychiatric: She has a normal mood and affect. Her behavior is normal.          Assessment & Plan:

## 2017-10-13 NOTE — Assessment & Plan Note (Addendum)
Noticeable for 6 months and much worse in past month Stopped smoking then--doubt COPD has become evident since then Could be coronary ischemia Recurrence of sarcoid Doubt infection (HIV titers undetectable and normal T4 counts on Rx)  Will check EKG and CXR and spirometry  CXR looks normal ---doubt sarcoid. Will await overread though Spirometry is normal --makes COPD unlikely EKG is sinus at 68. Borderline QT but no change. Non specific T changes without significant change from 01/18/17  Will proceed with echo and treadmill exercise test

## 2017-10-16 ENCOUNTER — Ambulatory Visit: Payer: Self-pay | Admitting: Endocrinology

## 2017-10-16 NOTE — Telephone Encounter (Signed)
Pt was seen 10/13/17.

## 2017-10-20 ENCOUNTER — Encounter (HOSPITAL_COMMUNITY): Payer: Self-pay

## 2017-10-20 ENCOUNTER — Ambulatory Visit: Payer: Managed Care, Other (non HMO)

## 2017-10-20 ENCOUNTER — Encounter: Payer: Self-pay | Admitting: *Deleted

## 2017-10-20 ENCOUNTER — Ambulatory Visit (HOSPITAL_COMMUNITY): Payer: Managed Care, Other (non HMO)

## 2017-10-23 ENCOUNTER — Other Ambulatory Visit: Payer: Managed Care, Other (non HMO)

## 2017-10-23 DIAGNOSIS — B20 Human immunodeficiency virus [HIV] disease: Secondary | ICD-10-CM

## 2017-10-24 ENCOUNTER — Other Ambulatory Visit: Payer: Self-pay | Admitting: Primary Care

## 2017-10-24 LAB — RPR: RPR Ser Ql: NONREACTIVE

## 2017-10-24 LAB — T-HELPER CELL (CD4) - (RCID CLINIC ONLY)
CD4 % Helper T Cell: 39 % (ref 33–55)
CD4 T CELL ABS: 1250 /uL (ref 400–2700)

## 2017-10-24 LAB — LIPID PANEL
Cholesterol: 117 mg/dL (ref <200)
HDL: 33 mg/dL — ABNORMAL LOW (ref >50)
LDL Cholesterol (Calc): 61 mg/dL (calc)
Non-HDL Cholesterol (Calc): 84 mg/dL (calc) (ref <130)
Total CHOL/HDL Ratio: 3.5 (calc) (ref <5.0)
Triglycerides: 149 mg/dL (ref <150)

## 2017-10-25 LAB — HIV-1 RNA QUANT-NO REFLEX-BLD
HIV 1 RNA Quant: 51 copies/mL — ABNORMAL HIGH
HIV-1 RNA Quant, Log: 1.71 Log copies/mL — ABNORMAL HIGH

## 2017-11-02 ENCOUNTER — Ambulatory Visit (INDEPENDENT_AMBULATORY_CARE_PROVIDER_SITE_OTHER): Payer: Managed Care, Other (non HMO) | Admitting: Internal Medicine

## 2017-11-02 DIAGNOSIS — F17210 Nicotine dependence, cigarettes, uncomplicated: Secondary | ICD-10-CM

## 2017-11-02 DIAGNOSIS — F419 Anxiety disorder, unspecified: Secondary | ICD-10-CM | POA: Diagnosis not present

## 2017-11-02 DIAGNOSIS — B20 Human immunodeficiency virus [HIV] disease: Secondary | ICD-10-CM | POA: Diagnosis not present

## 2017-11-02 NOTE — Assessment & Plan Note (Signed)
I gave her our counselors card today so she can schedule a visit.

## 2017-11-02 NOTE — Assessment & Plan Note (Signed)
She is under very good long-term control.  She will continue [facility] and follow-up after lab work in 6 months.

## 2017-11-02 NOTE — Progress Notes (Signed)
Patient Active Problem List   Diagnosis Date Noted  . Human immunodeficiency virus (HIV) disease ([place]) 07/24/2006    Priority: High  . Depression 07/24/2006    Priority: High  . Hidradenitis suppurativa of right >> left axilla 01/30/2012    Priority: Medium  . 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 Cigarette smoker 02/08/2007    Priority: Medium  . Essential hypertension 07/24/2006    Priority: Medium  . Asthma 07/24/2006    Priority: Medium  . DOE (dyspnea on exertion) 10/13/2017  . Hyperlipidemia 04/26/2017  . 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 Chronic anxiety 04/11/2017  . Upper airway cough syndrome 11/11/2016  . Morbid (severe) obesity due to excess calories ([place]) 10/14/2016  . Shortness of breath 10/13/2016  . Allergic rhinitis 02/18/2016  . Right shoulder pain 02/18/2016  . Type 2 diabetes mellitus ([place]) 06/23/2015  . Renal calculi 10/29/2014  . Recurrent boils 06/11/2014  . Abscess of breast, right 01/09/2013  . SARCOIDOSIS 02/08/2007  . GERD 07/24/2006    Patient's Medications  New Prescriptions   No medications on file  Previous Medications   AMLODIPINE (NORVASC) 10 MG TABLET    Take 1 tablet (10 mg total) by mouth daily.   ATORVASTATIN (LIPITOR) 20 MG TABLET    Take 1 tablet by mouth every evening for high cholesterol.   GLUCOSE BLOOD (ONETOUCH VERIO) TEST STRIP    Use as instructed to test blood sugar daily   HYDRALAZINE (APRESOLINE) 25 MG TABLET    Take 1 tablet (25 mg total) by mouth 3 (three) times daily.   LEVOTHYROXINE (SYNTHROID, LEVOTHROID) 175 MCG TABLET    Take 1 tablet by mouth every morning on an empty stomach with a full glass of water.   LOSARTAN-HYDROCHLOROTHIAZIDE (HYZAAR) 100-25 MG TABLET    Take 1 tablet by mouth daily.   METFORMIN (GLUCOPHAGE) 500 MG TABLET    TAKE 1 TABLET (500 MG TOTAL) BY MOUTH 2 (TWO) TIMES DAILY WITH A MEAL.   METOPROLOL SUCCINATE (TOPROL-XL) 50 MG 24 HR TABLET    Take 1  tablet (50 mg total) by mouth daily. Take with or immediately following a meal.   MOMETASONE (NASONEX) 50 MCG/ACT NASAL SPRAY    PLACE 2 SPRAYS INTO THE NOSE DAILY.   MULTIPLE VITAMIN (MULTIVITAMIN) TABLET    Take 1 tablet by mouth daily.   ODEFSEY 200-25-25 MG TABS TABLET    TAKE 1 TABLET BY MOUTH DAILY.   ONE TOUCH LANCETS MISC    Use as instructed to test blood sugar daily   PANTOPRAZOLE (PROTONIX) 40 MG TABLET    Take 40 mg by mouth daily as needed (for indigestion/acid reflux.).    PROAIR RESPICLICK 258 (90 BASE) MCG/ACT AEPB    INHALE 2 PUFFS INTO THE LUNGS EVERY 6 HOURS AS NEEDED FOR WHEEZING OR SHORTNESS OF BREATH.  Modified Medications   No medications on file  Discontinued Medications   No medications on file    Subjective: Cherese is in for her routine follow-up visit.  She has had no problems obtaining, taking or tolerating her Odefsey.  She recalls missing only one dose since her last visit here.  She had thyroid surgery recently and recovered uneventfully.  Just that she had promised me she was able to quit smoking cigarettes.  For obvious reasons she is very excited and proud of that.  She is struggling more with shortness of breath and is scheduled for  a cardiac stress test tomorrow.  She would like to lose some weight.  She just recently disclosed her HIV status to her daughter.  She believes that her daughter is having difficulty processing that.  She says that her daughter is depressed and acts like she is worried that something is going to happen to her mother.  Review of Systems: Review of Systems  Constitutional: Negative for chills, diaphoresis, fever, malaise/fatigue and weight loss.  HENT: Negative for sore throat.   Respiratory: Positive for shortness of breath. Negative for cough, sputum production and wheezing.   Cardiovascular: Negative for chest pain.  Gastrointestinal: Negative for abdominal pain, diarrhea, heartburn, nausea and vomiting.  Genitourinary: Negative  for dysuria and frequency.  Musculoskeletal: Negative for joint pain and myalgias.  Skin: Negative for rash.  Neurological: Negative for dizziness and headaches.  Psychiatric/Behavioral: Negative for depression and substance abuse. The patient is nervous/anxious.        She states that she remains anxious.  She says she is not really depressed but she is very moody.  She is wondering if she and her daughter could receive counseling together.    Past Medical History:  Diagnosis Date  . Anemia    Normocytic  . Anxiety   . Asthma   . Blood dyscrasia   . Bronchitis 2005  . 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 Diabetes mellitus without complication ([place])   . Gastroenteritis 07/10/07  . GERD 07/24/2006  . GRAVE'S DISEASE 01/01/2008  . History of hidradenitis suppurativa   . History of kidney stones   . History of thrush   . HIV DISEASE 07/24/2006   dx March 05  . HYPERTENSION 07/24/2006  . Hyperthyroidism 08/2006   Grave's Disease -diffuse radiotracer uptake 08/25/06 Thyroid scan-Cold nodule to R lower lobe of thyrorid  . Menometrorrhagia    hx of  . Nephrolithiasis   . 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 Pneumonia 2005  . 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 Sarcoidosis 02/08/2007   dx as a teenager in [place] from abnl CXR. Completed 2 yrs Prednisone after lung bx confirmation. No symptoms since then.  . Thyroid cancer ([place])   . THYROID NODULE, RIGHT 02/08/2007    Social History   Tobacco Use  . Smoking status: Former Smoker    Packs/day: 0.50    Years: 15.00    Pack years: 7.50    Types: Cigarettes    Start date: 04/12/2017  . Smokeless tobacco: Never Used  Substance Use Topics  . Alcohol use: No    Alcohol/week: 0.0 oz  . Drug use: No    Family History  Problem Relation Age of Onset  . Hypertension Mother   . Cancer Mother        throat  . Heart disease Mother        stent  .  Hypertension Father   . Cancer Other        FH of Breast Cancer  . Cancer Other   . Heart disease Other   . Hypertension Other   . Stroke Other        Grandparent  . Kidney disease Other        Grandparent  . Diabetes Other        FH of Diabetes  . Hypertension Sister   . Cancer Maternal Uncle        Stomach Cancer, Lung CA  . Hypertension Brother   . Hypertension Sister  Allergies  Allergen Reactions  . Tape Rash    Rash   . Genvoya [Elviteg-Cobic-Emtricit-Tenofaf] Hives  . Lisinopril Cough  . Xylocaine [Lidocaine]     Unknown , patient states she has had lidocaine for IV starts and at the dentist with no problems  . Valsartan Cough    Pt states she tolerates medicine now    Health Maintenance  Topic Date Due  . FOOT EXAM  05/14/1978  . PAP SMEAR  01/28/2014  . OPHTHALMOLOGY EXAM  07/15/2016  . HEMOGLOBIN A1C  10/05/2017  . TETANUS/TDAP  10/18/2019  . PNEUMOCOCCAL POLYSACCHARIDE VACCINE  Completed  . INFLUENZA VACCINE  Completed  . HIV Screening  Completed    Objective:  Vitals:   11/02/17 1601  BP: (!) 155/85  Pulse: 82  Temp: 98.3 F (36.8 C)  TempSrc: Oral   There is no height or weight on file to calculate BMI.  Physical Exam  Constitutional: She is oriented to person, place, and time.  He is smiling and in good spirits.  HENT:  Mouth/Throat: No oropharyngeal exudate.  Eyes: Conjunctivae are normal.  Neck:  Recent thyroid surgical incision is barely visible.  Cardiovascular: Normal rate and regular rhythm.  No murmur heard. Pulmonary/Chest: Effort normal and breath sounds normal.  Abdominal: Soft. She exhibits no mass. There is no tenderness.  Musculoskeletal: Normal range of motion.  Neurological: She is alert and oriented to person, place, and time.  Skin: No rash noted.  Psychiatric: Mood and affect normal.    Lab Results Lab Results  Component Value Date   WBC 6.1 10/13/2017   HGB 11.3 (L) 10/13/2017   HCT 34.9 (L) 10/13/2017     MCV 83.8 10/13/2017   PLT 419.0 (H) 10/13/2017    Lab Results  Component Value Date   CREATININE 1.17 10/13/2017   BUN 24 (H) 10/13/2017   NA 139 10/13/2017   K 3.4 (L) 10/13/2017   CL 102 10/13/2017   CO2 29 10/13/2017    Lab Results  Component Value Date   ALT 25 10/13/2017   AST 19 10/13/2017   ALKPHOS 82 10/13/2017   BILITOT 0.3 10/13/2017    Lab Results  Component Value Date   CHOL 117 10/23/2017   HDL 33 (L) 10/23/2017   LDLCALC 68 06/08/2017   TRIG 149 10/23/2017   CHOLHDL 3.5 10/23/2017   Lab Results  Component Value Date   LABRPR NON-REACTIVE 10/23/2017   HIV 1 RNA Quant (copies/mL)  Date Value  10/23/2017 51 (H)  03/20/2017 <20 NOT DETECTED  09/02/2016 <20   CD4 T Cell Abs (/uL)  Date Value  10/23/2017 1,250  03/20/2017 1,520  09/02/2016 1,190     Problem List Items Addressed This Visit      High   Human immunodeficiency virus (HIV) disease ([place])    She is under very good long-term control.  She will continue [facility] and follow-up after lab work in 6 months.      Relevant Orders   T-helper cell (CD4)- ([facility] clinic only)   HIV 1 RNA quant-no reflex-bld     Medium   Cigarette smoker    I congratulated her on cigarette cessation.        Unprioritized   Chronic anxiety    I gave her our counselors card today so she can schedule a visit.           Michel Bickers, MD [facility] [place] [facility] [phone] pager  [phone] cell 11/02/2017, 4:30 PM

## 2017-11-02 NOTE — Assessment & Plan Note (Signed)
I congratulated her on cigarette cessation.

## 2017-11-03 ENCOUNTER — Ambulatory Visit (INDEPENDENT_AMBULATORY_CARE_PROVIDER_SITE_OTHER): Payer: Managed Care, Other (non HMO)

## 2017-11-03 ENCOUNTER — Ambulatory Visit (HOSPITAL_COMMUNITY): Payer: Self-pay

## 2017-11-03 DIAGNOSIS — R0609 Other forms of dyspnea: Secondary | ICD-10-CM | POA: Diagnosis not present

## 2017-11-06 LAB — EXERCISE TOLERANCE TEST
CSEPEDS: 49 s
CSEPHR: 69 %
CSEPPHR: 118 {beats}/min
Estimated workload: 5.5 METS
Exercise duration (min): 3 min
MPHR: 171 {beats}/min
RPE: 18
Rest HR: 69 {beats}/min

## 2017-11-09 ENCOUNTER — Telehealth: Payer: Self-pay | Admitting: Primary Care

## 2017-11-09 DIAGNOSIS — J4520 Mild intermittent asthma, uncomplicated: Secondary | ICD-10-CM

## 2017-11-09 DIAGNOSIS — D869 Sarcoidosis, unspecified: Secondary | ICD-10-CM

## 2017-11-09 NOTE — Addendum Note (Signed)
Addended by: [name] on: 11/09/2017 12:31 PM   Modules accepted: Orders

## 2017-11-09 NOTE — Telephone Encounter (Signed)
Echo was scheduled for 11/03/17 and patient showed up 1 hour late for the appointment and they never rescheduled her for it and she never called [place] about it.

## 2017-11-09 NOTE — Telephone Encounter (Signed)
The echo was ordered. Can you check with cardiology to find out why it hasn't been scheduled?

## 2017-11-09 NOTE — Telephone Encounter (Signed)
Rescheduled the Echo with patient and Pulmonary Appt.

## 2017-11-09 NOTE — Telephone Encounter (Signed)
I will leave it to Anda Kraft to decide

## 2017-11-09 NOTE — Telephone Encounter (Signed)
Pt. Given stress test results. Verbalizes understanding. Also states she does not have an Echocardiogram scheduled. Would like to have the referral to lung specialist that Dr. Silvio Pate referred to.

## 2017-11-09 NOTE — Telephone Encounter (Addendum)
Noted. Referral placed to pulmonology. Rosaria Ferries can you help with scheduling the echocardiogram? Rich, thanks for your help. Much appreciated. I'm happy to take over now.

## 2017-11-10 ENCOUNTER — Ambulatory Visit: Payer: Self-pay | Admitting: Primary Care

## 2017-11-10 DIAGNOSIS — Z0289 Encounter for other administrative examinations: Secondary | ICD-10-CM

## 2017-11-16 ENCOUNTER — Other Ambulatory Visit (HOSPITAL_COMMUNITY): Payer: Self-pay

## 2017-11-23 ENCOUNTER — Other Ambulatory Visit: Payer: Self-pay | Admitting: Internal Medicine

## 2017-11-23 DIAGNOSIS — B20 Human immunodeficiency virus [HIV] disease: Secondary | ICD-10-CM

## 2017-11-24 ENCOUNTER — Ambulatory Visit: Payer: Self-pay | Admitting: Internal Medicine

## 2017-11-24 ENCOUNTER — Other Ambulatory Visit (HOSPITAL_COMMUNITY): Payer: Self-pay

## 2017-11-26 NOTE — Progress Notes (Deleted)
Patient ID: [name], female   DOB: 05-Jun-1966, 52 y.o.   MRN: [id number]           Referring Physician:  Reason for Appointment:  Hypothyroidism, new visit    History of Present Illness:   Hypothyroidism was first diagnosed in   At the time of diagnosis patient was having symptoms of  fatigue, cold sensitivity, difficulty concentrating, dry skin, weight gain and hair loss .           The patient has been treated with    With starting thyroid supplementation the patient's symptoms         Patient's weight history is as follows:  Wt Readings from Last 3 Encounters:  10/13/17 248 lb (112.5 kg)  09/01/17 252 lb 12.8 oz (114.7 kg)  04/26/17 242 lb 12.8 oz (110.1 kg)    Thyroid function results have been as follows:  Lab Results  Component Value Date   TSH 0.22 (L) 10/12/2017   TSH 0.14 (L) 09/01/2017   TSH 1.59 01/18/2017   TSH 6.49 (H) 09/21/2016   FREET4 1.03 10/12/2017   FREET4 1.56 09/01/2017   FREET4 0.87 (L) 06/05/2007   FREET4 2.10 (H) 02/08/2007     Past Medical History:  Diagnosis Date  . Anemia    Normocytic  . Anxiety   . Asthma   . Blood dyscrasia   . Bronchitis 2005  . 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 Diabetes mellitus without complication ([place])   . Gastroenteritis 07/10/07  . GERD 07/24/2006  . GRAVE'S DISEASE 01/01/2008  . History of hidradenitis suppurativa   . History of kidney stones   . History of thrush   . HIV DISEASE 07/24/2006   dx March 05  . HYPERTENSION 07/24/2006  . Hyperthyroidism 08/2006   Grave's Disease -diffuse radiotracer uptake 08/25/06 Thyroid scan-Cold nodule to R lower lobe of thyrorid  . Menometrorrhagia    hx of  . Nephrolithiasis   . 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 Pneumonia 2005  . 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 Sarcoidosis 02/08/2007   dx as a teenager in [place] from abnl CXR. Completed 2 yrs Prednisone after lung  bx confirmation. No symptoms since then.  . Thyroid cancer ([place])   . THYROID NODULE, RIGHT 02/08/2007    Past Surgical History:  Procedure Laterality Date  . BREAST SURGERY  1997   Breast Reduction   . CYSTOSCOPY W/ URETERAL STENT REMOVAL  11/09/2012   Procedure: CYSTOSCOPY WITH STENT REMOVAL;  Surgeon: Alexis Frock, MD;  Location: [facility];  Service: Urology;  Laterality: Right;  . CYSTOSCOPY WITH RETROGRADE PYELOGRAM, URETEROSCOPY AND STENT PLACEMENT  11/09/2012   Procedure: CYSTOSCOPY WITH RETROGRADE PYELOGRAM, URETEROSCOPY AND STENT PLACEMENT;  Surgeon: Alexis Frock, MD;  Location: [facility];  Service: Urology;  Laterality: Left;  LEFT URETEROSCOPY, STONE MANIPULATION, left STENT exchange   . CYSTOSCOPY WITH STENT PLACEMENT  10/02/2012   Procedure: CYSTOSCOPY WITH STENT PLACEMENT;  Surgeon: Alexis Frock, MD;  Location: [facility];  Service: Urology;  Laterality: Left;  . DILATION AND CURETTAGE OF UTERUS  Feb 2004   s/p for 1st trimester nonviable pregnancy  . EYE SURGERY     sty under eyelid  . INCISE AND DRAIN ABCESS  Nov 03   s/p I &D for righ inframmary fold hidradenitis  . INCISION AND DRAINAGE PERITONSILLAR ABSCESS  Mar 03  . IRRIGATION AND DEBRIDEMENT ABSCESS  01/31/2012   Procedure: IRRIGATION AND DEBRIDEMENT ABSCESS;  Surgeon: Shann Medal, MD;  Location: [facility];  Service: General;  Laterality: Right;  right breast and axilla   . NEPHROLITHOTOMY  10/02/2012   Procedure: NEPHROLITHOTOMY PERCUTANEOUS;  Surgeon: Alexis Frock, MD;  Location: [facility];  Service: Urology;  Laterality: Right;  First Stage Percutaneous Nephrolithotomy with Surgeon Access, Left Ureteral Stent    . NEPHROLITHOTOMY  10/04/2012   Procedure: NEPHROLITHOTOMY PERCUTANEOUS SECOND LOOK;  Surgeon: Alexis Frock, MD;  Location: [facility];  Service: Urology;  Laterality: Right;     . NEPHROLITHOTOMY  10/08/2012   Procedure: NEPHROLITHOTOMY PERCUTANEOUS;  Surgeon: Alexis Frock, MD;  Location: [facility];  Service:  Urology;  Laterality: Right;  THIRD STAGE, nephrostomy tube exchange x 2  . NEPHROLITHOTOMY  10/11/2012   Procedure: NEPHROLITHOTOMY PERCUTANEOUS SECOND LOOK;  Surgeon: Alexis Frock, MD;  Location: [facility];  Service: Urology;  Laterality: Right;  RIGHT 4 STAGE PERCUTANOUS NEPHROLITHOTOMY, right URETEROSCOPY WITH HOLMIUM LASER   . RADICAL NECK DISSECTION  04/12/2017   limited/notes 04/12/2017  . RADICAL NECK DISSECTION N/A 04/12/2017   Procedure: RADICAL NECK DISSECTION;  Surgeon: Melida Quitter, MD;  Location: [place];  Service: ENT;  Laterality: N/A;  limited neck dissection 2 hours total  . [place]  1994  . THYROIDECTOMY  04/12/2017   completion/notes 04/12/2017  . THYROIDECTOMY N/A 04/12/2017   Procedure: THYROIDECTOMY;  Surgeon: Melida Quitter, MD;  Location: [place];  Service: ENT;  Laterality: N/A;  Completion Thyroidectomy  . TOTAL THYROIDECTOMY  2010    Family History  Problem Relation Age of Onset  . Hypertension Mother   . Cancer Mother        throat  . Heart disease Mother        stent  . Hypertension Father   . Cancer Other        FH of Breast Cancer  . Cancer Other   . Heart disease Other   . Hypertension Other   . Stroke Other        Grandparent  . Kidney disease Other        Grandparent  . Diabetes Other        FH of Diabetes  . Hypertension Sister   . Cancer Maternal Uncle        Stomach Cancer, Lung CA  . Hypertension Brother   . Hypertension Sister     Social History:  reports that she has quit smoking. Her smoking use included cigarettes. She started smoking about 7 months ago. She has a 7.50 pack-year smoking history. she has never used smokeless tobacco. She reports that she does not drink alcohol or use drugs.  Allergies:  Allergies  Allergen Reactions  . Tape Rash    Rash   . Genvoya [Elviteg-Cobic-Emtricit-Tenofaf] Hives  . Lisinopril Cough  . Xylocaine [Lidocaine]     Unknown , patient states she has had lidocaine for IV starts and at the dentist  with no problems  . Valsartan Cough    Pt states she tolerates medicine now    Allergies as of 11/27/2017      Reactions   Tape Rash   Rash   Genvoya [elviteg-cobic-emtricit-tenofaf] Hives   Lisinopril Cough   Xylocaine [lidocaine]    Unknown , patient states she has had lidocaine for IV starts and at the dentist with no problems   Valsartan Cough   Pt states she tolerates medicine now      Medication List        Accurate as of 11/26/17  8:03 PM. Always use your most recent med list.          amLODipine 10 MG tablet Commonly known as:  NORVASC Take 1 tablet (10 mg total) by mouth daily.   atorvastatin 20 MG tablet Commonly known as:  LIPITOR Take 1 tablet by mouth every evening for high cholesterol.   glucose blood test strip Commonly known as:  ONETOUCH VERIO Use as instructed to test blood sugar daily   hydrALAZINE 25 MG tablet Commonly known as:  APRESOLINE Take 1 tablet (25 mg total) by mouth 3 (three) times daily.   levothyroxine 175 MCG tablet Commonly known as:  SYNTHROID, LEVOTHROID Take 1 tablet by mouth every morning on an empty stomach with a full glass of water.   losartan-hydrochlorothiazide 100-25 MG tablet Commonly known as:  HYZAAR Take 1 tablet by mouth daily.   metFORMIN 500 MG tablet Commonly known as:  GLUCOPHAGE TAKE 1 TABLET (500 MG TOTAL) BY MOUTH 2 (TWO) TIMES DAILY WITH A MEAL.   metoprolol succinate 50 MG 24 hr tablet Commonly known as:  TOPROL-XL Take 1 tablet (50 mg total) by mouth daily. Take with or immediately following a meal.   mometasone 50 MCG/ACT nasal spray Commonly known as:  NASONEX PLACE 2 SPRAYS INTO THE NOSE DAILY.   multivitamin tablet Take 1 tablet by mouth daily.   ODEFSEY 200-25-25 MG Tabs tablet Generic drug:  emtricitabine-rilpivir-tenofovir AF TAKE 1 TABLET BY MOUTH DAILY.   ONE TOUCH LANCETS Misc Use as instructed to test blood sugar daily   pantoprazole 40 MG tablet Commonly known as:   PROTONIX Take 40 mg by mouth daily as needed (for indigestion/acid reflux.).   PROAIR RESPICLICK 916 (90 Base) MCG/ACT Aepb Generic drug:  Albuterol Sulfate INHALE 2 PUFFS INTO THE LUNGS EVERY 6 HOURS AS NEEDED FOR WHEEZING OR SHORTNESS OF BREATH.          Review of Systems              Examination:    LMP 03/31/2014 Comment: Negative Serum HCG 06/07/17  GENERAL:  Average build.   No pallor, clubbing, lymphadenopathy or edema.  Skin:  no rash or pigmentation.  EYES:  No prominence of the eyes or swelling of the eyelids  ENT: Oral mucosa and tongue normal.  THYROID:  Not palpable.  HEART:  Normal  S1 and S2; no murmur or click.  CHEST:    Lungs: Vescicular breath sounds heard equally.  No crepitations/ wheeze.  ABDOMEN:  No distention.  Liver and spleen not palpable.  No other mass or tenderness.  NEUROLOGICAL: Reflexes are bilaterally at biceps.  JOINTS:  Normal.   Assessment:  HYPOTHYROIDISM  PLAN:    Follow-up   Elayne Snare 11/26/2017, 8:03 PM   Consultation note copy sent to the PCP  Note: This office note was prepared with Dragon voice recognition system technology. Any transcriptional errors that result from this process are unintentional.

## 2017-11-27 ENCOUNTER — Ambulatory Visit: Payer: Self-pay | Admitting: Endocrinology

## 2017-12-01 ENCOUNTER — Ambulatory Visit (HOSPITAL_COMMUNITY): Payer: Managed Care, Other (non HMO) | Attending: Cardiology

## 2017-12-01 ENCOUNTER — Other Ambulatory Visit: Payer: Self-pay

## 2017-12-01 DIAGNOSIS — R0609 Other forms of dyspnea: Secondary | ICD-10-CM | POA: Diagnosis not present

## 2017-12-02 ENCOUNTER — Other Ambulatory Visit: Payer: Self-pay | Admitting: Primary Care

## 2017-12-02 DIAGNOSIS — E890 Postprocedural hypothyroidism: Secondary | ICD-10-CM

## 2017-12-12 ENCOUNTER — Ambulatory Visit: Payer: Self-pay | Admitting: Internal Medicine

## 2017-12-19 ENCOUNTER — Other Ambulatory Visit: Payer: Self-pay | Admitting: Surgery

## 2017-12-19 ENCOUNTER — Telehealth: Payer: Self-pay | Admitting: *Deleted

## 2017-12-19 NOTE — Telephone Encounter (Signed)
Patient is upset, asked for advice. She has been going to [facility] for treatment of hidradenitis for some time. She states they have flared, Dr Ninfa Linden is recommending excision instead of simple I&D.  Unfortunately, [facility] is requiring she pay $700+ up front before they will even schedule her, in addition to her having insurance.  She is asking if there is another surgical group in the area she can go to.  RN advised that there is a general surgery clinic at [facility], gave her the phone number to reach out to them. If she feels they would be an option, RN asked her to request [facility] refer her there for follow up.  RN advised we would be able to send only our notes, that we do not have access to her [facility] information and any other surgical group would need that info before proceeding. Landis Gandy, RN

## 2017-12-28 ENCOUNTER — Telehealth: Payer: Self-pay

## 2017-12-28 NOTE — Telephone Encounter (Signed)
Patient needs evaluation and BP check in the office, please put her on my schedule for tomorrow (Friday 03/15) at either 10 am or create an 11:45 am slot. Thanks!

## 2017-12-28 NOTE — Telephone Encounter (Signed)
Copied from [place] [id number]. Topic: General - Other >> Dec 28, 2017 10:13 AM Carolyn Stare wrote: Pt said she picked up the below med and she started itching. She said she spoke to the pharmacy and they told her it was the same med just a different manufacturer. She said she is taking benadryl for the itching. 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

## 2017-12-28 NOTE — Telephone Encounter (Signed)
Request given to [name] because it requires security access to schedule.

## 2017-12-29 ENCOUNTER — Encounter: Payer: Self-pay | Admitting: Primary Care

## 2017-12-29 ENCOUNTER — Ambulatory Visit: Payer: Managed Care, Other (non HMO) | Admitting: Primary Care

## 2017-12-29 VITALS — BP 148/82 | HR 66 | Temp 98.2°F | Ht 65.0 in | Wt 248.2 lb

## 2017-12-29 DIAGNOSIS — I10 Essential (primary) hypertension: Secondary | ICD-10-CM

## 2017-12-29 DIAGNOSIS — M545 Low back pain, unspecified: Secondary | ICD-10-CM

## 2017-12-29 DIAGNOSIS — N3001 Acute cystitis with hematuria: Secondary | ICD-10-CM

## 2017-12-29 LAB — POC URINALSYSI DIPSTICK (AUTOMATED)
Bilirubin, UA: NEGATIVE
GLUCOSE UA: NEGATIVE
KETONES UA: NEGATIVE
Nitrite, UA: NEGATIVE
Spec Grav, UA: 1.01 (ref 1.010–1.025)
Urobilinogen, UA: 0.2 E.U./dL
pH, UA: 7 (ref 5.0–8.0)

## 2017-12-29 MED ORDER — CIPROFLOXACIN HCL 500 MG PO TABS: 500.0000 mg | ORAL_TABLET | Freq: Two times a day (BID) | ORAL | 0 refills | Status: AC

## 2017-12-29 MED ORDER — IRBESARTAN-HYDROCHLOROTHIAZIDE 150-12.5 MG PO TABS: ORAL_TABLET | ORAL | 0 refills | Status: DC

## 2017-12-29 NOTE — Patient Instructions (Addendum)
We have switched your Losartan/HCTZ (Hyzaar) to Irbesartan-HCTZ (Avalide) 150-12.5mg . Take 1 tablet by mouth once daily for blood pressure.  Please take your BP at home at least 2-3 times a week. Once in the morning after you wake up and once in late afternoon/evening when you get home. Please write these down and bring them in for [place] to review.   Your urine test does show a UTI. We are prescribing ciprofloxacin (CIPRO) 500 MG tablet; Take 1 tablet (500 mg total) by mouth 2 (two) times daily for 5 days.  Please take all of the medication.  If symptoms persist or get worse, let us know  It has been a pleasure seeing you today. Denita Lung, RN, Adult-Geriatric Nurse Practitioner Student and Allie Bossier, AGNP  Schedule a follow up visit for re-evaluation of your blood pressure in 2-3 weeks.     Blood Pressure Record Sheet Your blood pressure on this visit to the emergency department or clinic is elevated. This does not necessarily mean you have high blood pressure (hypertension), but it does mean that your blood pressure needs to be rechecked. Many times your blood pressure can increase due to illness, pain, anxiety, or other factors. We recommend that you get a series of blood pressure readings done over a period of 5 days. It is best to get a reading in the morning and one in the evening. You should make sure to sit and relax for 1-5 minutes before the reading is taken. Write the readings down and make a follow-up appointment with your health care provider to discuss the results. If there is not a free clinic or a drug store with a blood-pressure-taking machine near you, you can purchase blood-pressure-taking equipment from a drug store. Having one in the home allows you the convenience of taking your blood pressure while you are home and relaxed.  This information is not intended to replace advice given to you by your health care provider. Make sure you discuss any questions you have with  your health care provider. Document Released: 07/02/2003 Document Revised: 09/16/2016 Document Reviewed: 11/26/2013 Elsevier Interactive Patient Education  2018 [employer].  Blood Pressure Log Date: _______________________  a.m. _____________________  p.m. _____________________  Date: _______________________  a.m. _____________________  p.m. _____________________  Date: _______________________  a.m. _____________________  p.m. _____________________  Date: _______________________  a.m. _____________________  p.m. _____________________  Date: _______________________  a.m. _____________________  p.m. _____________________ Blood Pressure Log Date: _______________________  a.m. _____________________  p.m. _____________________  Date: _______________________  a.m. _____________________  p.m. _____________________  Date: _______________________  a.m. _____________________  p.m. _____________________  Date: _______________________  a.m. _____________________  p.m. _____________________  Date: _______________________  a.m. _____________________  p.m. _____________________ Blood Pressure Log Date: _______________________  a.m. _____________________  p.m. _____________________  Date: _______________________  a.m. _____________________  p.m. _____________________  Date: _______________________  a.m. _____________________  p.m. _____________________  Date: _______________________  a.m. _____________________  p.m. _____________________  Date: _______________________  a.m. _____________________  p.m. _____________________ Blood Pressure Log Date: _______________________  a.m. _____________________  p.m. _____________________  Date: _______________________  a.m. _____________________  p.m. _____________________  Date: _______________________  a.m. _____________________  p.m. _____________________  Date:  _______________________  a.m. _____________________  p.m. _____________________  Date: _______________________  a.m. _____________________  p.m. _____________________ Blood Pressure Log Date: _______________________  a.m. _____________________  p.m. _____________________  Date: _______________________  a.m. _____________________  p.m. _____________________  Date: _______________________  a.m. _____________________  p.m. _____________________  Date: _______________________  a.m. _____________________  p.m. _____________________  Date: _______________________  a.m. _____________________  p.m. _____________________ Blood Pressure Log Date: _______________________  a.m. _____________________  p.m. _____________________  Date: _______________________  a.m. _____________________  p.m. _____________________  Date: _______________________  a.m. _____________________  p.m. _____________________  Date: _______________________  a.m. _____________________  p.m. _____________________  Date: _______________________  a.m. _____________________  p.m. _____________________

## 2017-12-29 NOTE — Progress Notes (Signed)
Subjective:    Patient ID: [name], female    DOB: [birth year], 52 y.o.   MRN: [id number]  HPI  [name] is a 52 year old female who presents today with a chief complaint of pruritus and for blood pressure follow up.  She is currently managed on Amlodipine 10 mg, hydralazine 25 mg TID, losartan-HCTZ 100-25 mg, Toprol XL 50 mg.   BP Readings from Last 3 Encounters:  12/29/17 (!) 148/82  11/02/17 (!) 155/85  10/13/17 126/90     She called into our phone lines yesterday with complaints of full body itching since her losartan-HCTZ was refilled on Monday. She called the pharmacy who noted that the medication was a different manufacturer.   He itching began Monday evening and has been continuous since. Her last pill was Wednesday morning. She's been taking Benadryl once to twice daily since Monday with improvement. She denies rashes, changes in soaps, detergents, food, shortness of breath, wheezing, throat tightness. Her itching has improved since she stopped her Hyzaar.   2) Low Back Pain: Located to mid lower back that began one month ago. She also reports urinary frequency, foul smelling urine, dark colored urine, and hematuria once 2-3 weeks ago. She's been taking muscle relaxer without improvement. She denies lifting heavy objects, injury/trauma, vaginal symptoms.   3) Recurrent Skin Infections: Recurrent boils for years, currently following with [facility] for permanent removal who is now too costly. She will be calling [facility] later today and may need a referral. She's been on Doxycycline and Bactrim DS tablets in January and early February 2019.  Review of Systems  Eyes: Negative for visual disturbance.  Respiratory: Negative for shortness of breath.   Cardiovascular: Negative for chest pain.  Genitourinary: Positive for frequency. Negative for dysuria, pelvic pain and vaginal discharge.  Musculoskeletal: Positive for back pain.  Skin:       Recurrent  boils       Past Medical History:  Diagnosis Date  . Anemia    Normocytic  . Anxiety   . Asthma   . Blood dyscrasia   . Bronchitis 2005  . 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 Diabetes mellitus without complication ([place])   . Gastroenteritis 07/10/07  . GERD 07/24/2006  . GRAVE'S DISEASE 01/01/2008  . History of hidradenitis suppurativa   . History of kidney stones   . History of thrush   . HIV DISEASE 07/24/2006   dx March 05  . HYPERTENSION 07/24/2006  . Hyperthyroidism 08/2006   Grave's Disease -diffuse radiotracer uptake 08/25/06 Thyroid scan-Cold nodule to R lower lobe of thyrorid  . Menometrorrhagia    hx of  . Nephrolithiasis   . 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 Pneumonia 2005  . 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 Sarcoidosis 02/08/2007   dx as a teenager in [place] from abnl CXR. Completed 2 yrs Prednisone after lung bx confirmation. No symptoms since then.  . Thyroid cancer ([place])   . THYROID NODULE, RIGHT 02/08/2007     Social History   Socioeconomic History  . Marital status: Single    Spouse name: Not on file  . Number of children: 1  . Years of education: Not on file  . Highest education level: Not on file  Social Needs  . Financial resource strain: Not on file  . Food insecurity - worry: Not on file  . Food insecurity - inability: Not on file  . Transportation needs -  medical: Not on file  . Transportation needs - non-medical: Not on file  Occupational History  . Occupation: Secondary school teacher  Tobacco Use  . Smoking status: Former Smoker    Packs/day: 0.50    Years: 15.00    Pack years: 7.50    Types: Cigarettes    Start date: 04/12/2017  . Smokeless tobacco: Never Used  Substance and Sexual Activity  . Alcohol use: No    Alcohol/week: 0.0 oz  . Drug use: No  . Sexual activity: Not Currently    Comment: declined condoms  Other Topics Concern  . Not on file    Social History Narrative  . Not on file    Past Surgical History:  Procedure Laterality Date  . BREAST SURGERY  1997   Breast Reduction   . CYSTOSCOPY W/ URETERAL STENT REMOVAL  11/09/2012   Procedure: CYSTOSCOPY WITH STENT REMOVAL;  Surgeon: Alexis Frock, MD;  Location: [facility];  Service: Urology;  Laterality: Right;  . CYSTOSCOPY WITH RETROGRADE PYELOGRAM, URETEROSCOPY AND STENT PLACEMENT  11/09/2012   Procedure: CYSTOSCOPY WITH RETROGRADE PYELOGRAM, URETEROSCOPY AND STENT PLACEMENT;  Surgeon: Alexis Frock, MD;  Location: [facility];  Service: Urology;  Laterality: Left;  LEFT URETEROSCOPY, STONE MANIPULATION, left STENT exchange   . CYSTOSCOPY WITH STENT PLACEMENT  10/02/2012   Procedure: CYSTOSCOPY WITH STENT PLACEMENT;  Surgeon: Alexis Frock, MD;  Location: [facility];  Service: Urology;  Laterality: Left;  . DILATION AND CURETTAGE OF UTERUS  Feb 2004   s/p for 1st trimester nonviable pregnancy  . EYE SURGERY     sty under eyelid  . INCISE AND DRAIN ABCESS  Nov 03   s/p I &D for righ inframmary fold hidradenitis  . INCISION AND DRAINAGE PERITONSILLAR ABSCESS  Mar 03  . IRRIGATION AND DEBRIDEMENT ABSCESS  01/31/2012   Procedure: IRRIGATION AND DEBRIDEMENT ABSCESS;  Surgeon: Shann Medal, MD;  Location: [facility];  Service: General;  Laterality: Right;  right breast and axilla   . NEPHROLITHOTOMY  10/02/2012   Procedure: NEPHROLITHOTOMY PERCUTANEOUS;  Surgeon: Alexis Frock, MD;  Location: [facility];  Service: Urology;  Laterality: Right;  First Stage Percutaneous Nephrolithotomy with Surgeon Access, Left Ureteral Stent    . NEPHROLITHOTOMY  10/04/2012   Procedure: NEPHROLITHOTOMY PERCUTANEOUS SECOND LOOK;  Surgeon: Alexis Frock, MD;  Location: [facility];  Service: Urology;  Laterality: Right;     . NEPHROLITHOTOMY  10/08/2012   Procedure: NEPHROLITHOTOMY PERCUTANEOUS;  Surgeon: Alexis Frock, MD;  Location: [facility];  Service: Urology;  Laterality: Right;  THIRD STAGE, nephrostomy tube  exchange x 2  . NEPHROLITHOTOMY  10/11/2012   Procedure: NEPHROLITHOTOMY PERCUTANEOUS SECOND LOOK;  Surgeon: Alexis Frock, MD;  Location: [facility];  Service: Urology;  Laterality: Right;  RIGHT 4 STAGE PERCUTANOUS NEPHROLITHOTOMY, right URETEROSCOPY WITH HOLMIUM LASER   . RADICAL NECK DISSECTION  04/12/2017   limited/notes 04/12/2017  . RADICAL NECK DISSECTION N/A 04/12/2017   Procedure: RADICAL NECK DISSECTION;  Surgeon: Melida Quitter, MD;  Location: [facility];  Service: ENT;  Laterality: N/A;  limited neck dissection 2 hours total  . [place]  1994  . THYROIDECTOMY  04/12/2017   completion/notes 04/12/2017  . THYROIDECTOMY N/A 04/12/2017   Procedure: THYROIDECTOMY;  Surgeon: Melida Quitter, MD;  Location: [facility];  Service: ENT;  Laterality: N/A;  Completion Thyroidectomy  . TOTAL THYROIDECTOMY  2010    Family History  Problem Relation Age of Onset  . Hypertension Mother   . Cancer Mother  throat  . Heart disease Mother        stent  . Hypertension Father   . Cancer Other        FH of Breast Cancer  . Cancer Other   . Heart disease Other   . Hypertension Other   . Stroke Other        Grandparent  . Kidney disease Other        Grandparent  . Diabetes Other        FH of Diabetes  . Hypertension Sister   . Cancer Maternal Uncle        Stomach Cancer, Lung CA  . Hypertension Brother   . Hypertension Sister     Allergies  Allergen Reactions  . Tape Rash    Rash   . Genvoya [Elviteg-Cobic-Emtricit-Tenofaf] Hives  . Lisinopril Cough  . Xylocaine [Lidocaine]     Unknown , patient states she has had lidocaine for IV starts and at the dentist with no problems  . Valsartan Cough    Pt states she tolerates medicine now    Current Outpatient Medications on File Prior to Visit  Medication Sig Dispense Refill  . amLODipine (NORVASC) 10 MG tablet Take 1 tablet (10 mg total) by mouth daily. 90 tablet 0  . atorvastatin (LIPITOR) 20 MG tablet Take 1 tablet by mouth every evening for  high cholesterol. 90 tablet 3  . glucose blood (ONETOUCH VERIO) test strip Use as instructed to test blood sugar daily 100 each 5  . hydrALAZINE (APRESOLINE) 25 MG tablet Take 1 tablet (25 mg total) by mouth 3 (three) times daily. 270 tablet 1  . levothyroxine (SYNTHROID, LEVOTHROID) 175 MCG tablet TAKE 1 TABLET BY MOUTH EVERY MORNING ON AN EMPTY STOMACH WITH A FULL GLASS OF WATER. 90 tablet 0  . metFORMIN (GLUCOPHAGE) 500 MG tablet TAKE 1 TABLET (500 MG TOTAL) BY MOUTH 2 (TWO) TIMES DAILY WITH A MEAL. 60 tablet 1  . metoprolol succinate (TOPROL-XL) 50 MG 24 hr tablet Take 1 tablet (50 mg total) by mouth daily. Take with or immediately following a meal. 90 tablet 3  . mometasone (NASONEX) 50 MCG/ACT nasal spray PLACE 2 SPRAYS INTO THE NOSE DAILY. 17 g 3  . Multiple Vitamin (MULTIVITAMIN) tablet Take 1 tablet by mouth daily.    . ODEFSEY 200-25-25 MG TABS tablet TAKE 1 TABLET BY MOUTH DAILY. 30 tablet 5  . ONE TOUCH LANCETS MISC Use as instructed to test blood sugar daily 200 each 5  . pantoprazole (PROTONIX) 40 MG tablet Take 40 mg by mouth daily as needed (for indigestion/acid reflux.).     Marland Kitchen PROAIR RESPICLICK 938 (90 Base) MCG/ACT AEPB INHALE 2 PUFFS INTO THE LUNGS EVERY 6 HOURS AS NEEDED FOR WHEEZING OR SHORTNESS OF BREATH. 1 each 2   No current facility-administered medications on file prior to visit.     BP (!) 148/82   Pulse 66   Temp 98.2 F (36.8 C) (Oral)   Ht 5\' 5"  (1.651 m)   Wt 248 lb 4 oz (112.6 kg)   LMP 03/31/2014 Comment: Negative Serum HCG 06/07/17  SpO2 95%   BMI 41.31 kg/m    Objective:   Physical Exam  Constitutional: She appears well-nourished.  Neck: Neck supple.  Cardiovascular: Normal rate and regular rhythm.  Pulmonary/Chest: Effort normal and breath sounds normal.  Abdominal: There is no CVA tenderness.  Skin: Skin is warm and dry.  Psychiatric: She has a normal mood and affect.  Assessment & Plan:  Low Back Pain:  Located to mid lower  back x 1 month, also with urinary symptoms.  Exam unremarkable. UA today: 3+ leuks, negative nitrites, 3+ blood. Culture sent. Rx for Cipro 500 mg tablet course sent to pharmacy. Push in take of water. Discussed AZO.  Recurrent Skin Infections:  Long history of this for years. Following with [facility] who is moving forward with surgical removal of infections, too costly now. She'll call [facility], will place referral if needed.  [name], NP

## 2017-12-29 NOTE — Progress Notes (Addendum)
Subjective:    Patient ID: [name], female    DOB: [birth year], 52 y.o.   MRN: [id number]  HPI [name] is a 52 y.o. female who presents today with itching.Started 5 days ago when she took her Hyzaar and has been constant. She called the pharmacy and they informed her that it was a different manufacturer due to the old manufacturer stopped making the medication. She took her last dose on Wednesday and itching has improved. Denies any rashes, welts, skin raising, or changes in detergents, diet, medications, or new allergies.   She also is complaining of dull mid lumbar 6/10 back pain over the last month. She has had UTIs in the past and reports similar pain. Reports her urine being malodorous and dark with nocturia and ur. There has been one ocasion of hematuria   Review of Systems  Constitutional: Negative for chills and fever.  Respiratory: Negative for chest tightness and shortness of breath.   Cardiovascular: Negative for chest pain.  Genitourinary: Negative for difficulty urinating, dysuria, pelvic pain, vaginal bleeding, vaginal discharge and vaginal pain.  Musculoskeletal: Positive for back pain. Negative for gait problem.  Skin: Negative for color change and pallor.  Allergic/Immunologic: Negative for environmental allergies and food allergies.       Past Medical History:  Diagnosis Date  . Anemia    Normocytic  . Anxiety   . Asthma   . Blood dyscrasia   . Bronchitis 2005  . 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 Diabetes mellitus without complication ([place])   . Gastroenteritis 07/10/07  . GERD 07/24/2006  . GRAVE'S DISEASE 01/01/2008  . History of hidradenitis suppurativa   . History of kidney stones   . History of thrush   . HIV DISEASE 07/24/2006   dx March 05  . HYPERTENSION 07/24/2006  . Hyperthyroidism 08/2006   Grave's Disease -diffuse radiotracer uptake 08/25/06 Thyroid scan-Cold nodule to R lower lobe of thyrorid  . Menometrorrhagia    hx of  .  Nephrolithiasis   . 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 Pneumonia 2005  . 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 Sarcoidosis 02/08/2007   dx as a teenager in [place] from abnl CXR. Completed 2 yrs Prednisone after lung bx confirmation. No symptoms since then.  . Thyroid cancer ([place])   . THYROID NODULE, RIGHT 02/08/2007   Past Surgical History:  Procedure Laterality Date  . BREAST SURGERY  1997   Breast Reduction   . CYSTOSCOPY W/ URETERAL STENT REMOVAL  11/09/2012   Procedure: CYSTOSCOPY WITH STENT REMOVAL;  Surgeon: Alexis Frock, MD;  Location: [facility];  Service: Urology;  Laterality: Right;  . CYSTOSCOPY WITH RETROGRADE PYELOGRAM, URETEROSCOPY AND STENT PLACEMENT  11/09/2012   Procedure: CYSTOSCOPY WITH RETROGRADE PYELOGRAM, URETEROSCOPY AND STENT PLACEMENT;  Surgeon: Alexis Frock, MD;  Location: [facility];  Service: Urology;  Laterality: Left;  LEFT URETEROSCOPY, STONE MANIPULATION, left STENT exchange   . CYSTOSCOPY WITH STENT PLACEMENT  10/02/2012   Procedure: CYSTOSCOPY WITH STENT PLACEMENT;  Surgeon: Alexis Frock, MD;  Location: [facility];  Service: Urology;  Laterality: Left;  . DILATION AND CURETTAGE OF UTERUS  Feb 2004   s/p for 1st trimester nonviable pregnancy  . EYE SURGERY     sty under eyelid  . INCISE AND DRAIN ABCESS  Nov 03   s/p I &D for righ inframmary fold hidradenitis  . INCISION AND DRAINAGE PERITONSILLAR ABSCESS  Mar 03  . IRRIGATION AND DEBRIDEMENT  ABSCESS  01/31/2012   Procedure: IRRIGATION AND DEBRIDEMENT ABSCESS;  Surgeon: Shann Medal, MD;  Location: [facility];  Service: General;  Laterality: Right;  right breast and axilla   . NEPHROLITHOTOMY  10/02/2012   Procedure: NEPHROLITHOTOMY PERCUTANEOUS;  Surgeon: Alexis Frock, MD;  Location: [facility];  Service: Urology;  Laterality: Right;  First Stage Percutaneous Nephrolithotomy with Surgeon Access, Left Ureteral Stent    . NEPHROLITHOTOMY   10/04/2012   Procedure: NEPHROLITHOTOMY PERCUTANEOUS SECOND LOOK;  Surgeon: Alexis Frock, MD;  [facility];  Service: Urology;  Laterality: Right;     . NEPHROLITHOTOMY  10/08/2012   Procedure: NEPHROLITHOTOMY PERCUTANEOUS;  Surgeon: Alexis Frock, MD;  Location[facility];  Service: Urology;  Laterality: Right;  THIRD STAGE, nephrostomy tube exchange x 2  . NEPHROLITHOTOMY  10/11/2012   Procedure: NEPHROLITHOTOMY PERCUTANEOUS SECOND LOOK;  Surgeon: Alexis Frock, MD;  Location[facility];  Service: Urology;  Laterality: Right;  RIGHT 4 STAGE PERCUTANOUS NEPHROLITHOTOMY, right URETEROSCOPY WITH HOLMIUM LASER   . RADICAL NECK DISSECTION  04/12/2017   limited/notes 04/12/2017  . RADICAL NECK DISSECTION N/A 04/12/2017   Procedure: RADICAL NECK DISSECTION;  Surgeon: Melida Quitter, MD;  Location: [facility];  Service: ENT;  Laterality: N/A;  limited neck dissection 2 hours total  . [place]  1994  . THYROIDECTOMY  04/12/2017   completion/notes 04/12/2017  . THYROIDECTOMY N/A 04/12/2017   Procedure: THYROIDECTOMY;  Surgeon: Melida Quitter, MD;  Location: [facility];  Service: ENT;  Laterality: N/A;  Completion Thyroidectomy  . TOTAL THYROIDECTOMY  2010   Social History   Socioeconomic History  . Marital status: Single    Spouse name: Not on file  . Number of children: 1  . Years of education: Not on file  . Highest education level: Not on file  Social Needs  . Financial resource strain: Not on file  . Food insecurity - worry: Not on file  . Food insecurity - inability: Not on file  . Transportation needs - medical: Not on file  . Transportation needs - non-medical: Not on file  Occupational History  . Occupation: Secondary school teacher  Tobacco Use  . Smoking status: Former Smoker    Packs/day: 0.50    Years: 15.00    Pack years: 7.50    Types: Cigarettes    Start date: 04/12/2017  . Smokeless tobacco: Never Used  Substance and Sexual Activity  . Alcohol use: No    Alcohol/week: 0.0 oz  . Drug use:  No  . Sexual activity: Not Currently    Comment: declined condoms  Other Topics Concern  . Not on file  Social History Narrative  . Not on file   Family History  Problem Relation Age of Onset  . Hypertension Mother   . Cancer Mother        throat  . Heart disease Mother        stent  . Hypertension Father   . Cancer Other        FH of Breast Cancer  . Cancer Other   . Heart disease Other   . Hypertension Other   . Stroke Other        Grandparent  . Kidney disease Other        Grandparent  . Diabetes Other        FH of Diabetes  . Hypertension Sister   . Cancer Maternal Uncle        Stomach Cancer, Lung CA  . Hypertension Brother   .  Hypertension Sister    Current Outpatient Medications on File Prior to Visit  Medication Sig Dispense Refill  . amLODipine (NORVASC) 10 MG tablet Take 1 tablet (10 mg total) by mouth daily. 90 tablet 0  . atorvastatin (LIPITOR) 20 MG tablet Take 1 tablet by mouth every evening for high cholesterol. 90 tablet 3  . glucose blood (ONETOUCH VERIO) test strip Use as instructed to test blood sugar daily 100 each 5  . hydrALAZINE (APRESOLINE) 25 MG tablet Take 1 tablet (25 mg total) by mouth 3 (three) times daily. 270 tablet 1  . levothyroxine (SYNTHROID, LEVOTHROID) 175 MCG tablet TAKE 1 TABLET BY MOUTH EVERY MORNING ON AN EMPTY STOMACH WITH A FULL GLASS OF WATER. 90 tablet 0  . losartan-hydrochlorothiazide (HYZAAR) 100-25 MG tablet Take 1 tablet by mouth daily. 90 tablet 3  . metFORMIN (GLUCOPHAGE) 500 MG tablet TAKE 1 TABLET (500 MG TOTAL) BY MOUTH 2 (TWO) TIMES DAILY WITH A MEAL. 60 tablet 1  . metoprolol succinate (TOPROL-XL) 50 MG 24 hr tablet Take 1 tablet (50 mg total) by mouth daily. Take with or immediately following a meal. 90 tablet 3  . mometasone (NASONEX) 50 MCG/ACT nasal spray PLACE 2 SPRAYS INTO THE NOSE DAILY. 17 g 3  . Multiple Vitamin (MULTIVITAMIN) tablet Take 1 tablet by mouth daily.    . ODEFSEY 200-25-25 MG TABS tablet TAKE 1  TABLET BY MOUTH DAILY. 30 tablet 5  . ONE TOUCH LANCETS MISC Use as instructed to test blood sugar daily 200 each 5  . pantoprazole (PROTONIX) 40 MG tablet Take 40 mg by mouth daily as needed (for indigestion/acid reflux.).     Marland Kitchen PROAIR RESPICLICK 497 (90 Base) MCG/ACT AEPB INHALE 2 PUFFS INTO THE LUNGS EVERY 6 HOURS AS NEEDED FOR WHEEZING OR SHORTNESS OF BREATH. 1 each 2   No current facility-administered medications on file prior to visit.     Objective:   Physical Exam  Constitutional: She appears well-nourished.  Abdominal: There is no CVA tenderness.  Skin: Skin is warm, dry and intact. No rash noted. She is not diaphoretic. No pallor.    BP (!) 148/82   Pulse 66   Temp 98.2 F (36.8 C) (Oral)   Ht 5\' 5"  (1.651 m)   Wt 248 lb 4 oz (112.6 kg)   LMP 03/31/2014 Comment: Negative Serum HCG 06/07/17  SpO2 95%   BMI 41.31 kg/m    BP Readings from Last 3 Encounters:  11/02/17 (!) 155/85  10/13/17 126/90  09/01/17 (!) 146/92       Assessment & Plan:   1. Essential hypertension - Stop taking Hyzaar - irbesartan-hydrochlorothiazide (AVALIDE) 150-12.5 MG tablet; Take 1 tablet by mouth once daily for blood pressure.  Dispense: 30 tablet; Refill: 0  2. Acute midline low back pain without sciatica - Likely caused by your UTI  3. Acute cystitis with hematuria - POCT Urinalysis Dipstick (Automated) - ciprofloxacin (CIPRO) 500 MG tablet; Take 1 tablet (500 mg total) by mouth 2 (two) times daily for 10 doses.  Dispense: 10 tablet; Refill: 0  Denita Lung, RN, Adult-Geriatric Nurse Practitioner Student

## 2017-12-29 NOTE — Assessment & Plan Note (Signed)
Symptoms of itching since new manufacture of Hyzaar, this has improved since she stopped taking. Will change to irbesartan-HCTZ 150-12.5 mg. Discussed for her to start monitoring BP at home. Follow up in 1 month for BP check.

## 2017-12-30 ENCOUNTER — Other Ambulatory Visit: Payer: Self-pay | Admitting: Primary Care

## 2017-12-30 LAB — URINE CULTURE
MICRO NUMBER:: 90331529
SPECIMEN QUALITY:: ADEQUATE

## 2018-01-03 ENCOUNTER — Ambulatory Visit: Payer: Self-pay | Admitting: Endocrinology

## 2018-01-03 DIAGNOSIS — Z0289 Encounter for other administrative examinations: Secondary | ICD-10-CM

## 2018-01-22 ENCOUNTER — Telehealth: Payer: Self-pay

## 2018-01-22 ENCOUNTER — Other Ambulatory Visit: Payer: Self-pay | Admitting: Primary Care

## 2018-01-22 DIAGNOSIS — I10 Essential (primary) hypertension: Secondary | ICD-10-CM

## 2018-01-22 NOTE — Telephone Encounter (Signed)
Left message for patient to call [name] back in regards to a referral about pulmonology-[name], RMA

## 2018-01-26 ENCOUNTER — Other Ambulatory Visit: Payer: Self-pay | Admitting: Primary Care

## 2018-01-26 DIAGNOSIS — I10 Essential (primary) hypertension: Secondary | ICD-10-CM

## 2018-01-26 NOTE — Telephone Encounter (Signed)
Ok to refill? Electronically refill request for irbesartan-hydrochlorothiazide (AVALIDE) 150-12.5 MG tablet  Last prescribed on 12/29/2017. Last seen on 12/29/2017. No future appt

## 2018-01-27 NOTE — Telephone Encounter (Signed)
How's her BP running on the irbesartan-HCTZ since we switched from losartan-HCTZ? Refills sent to pharmacy.

## 2018-01-30 NOTE — Telephone Encounter (Signed)
Message left for patient to return my call.  

## 2018-01-31 NOTE — Telephone Encounter (Signed)
Spoken to pateint. She stated that her blood pressure is doing okay. She does not check it every day and the last time it was 124/72

## 2018-01-31 NOTE — Telephone Encounter (Signed)
Noted  

## 2018-02-06 ENCOUNTER — Ambulatory Visit: Payer: Self-pay | Admitting: *Deleted

## 2018-02-06 DIAGNOSIS — T3695XA Adverse effect of unspecified systemic antibiotic, initial encounter: Principal | ICD-10-CM

## 2018-02-06 DIAGNOSIS — B379 Candidiasis, unspecified: Secondary | ICD-10-CM

## 2018-02-06 NOTE — Telephone Encounter (Signed)
Pt is requesting something for a yeast infection. She  Was treated for uti march and was rx cipro and prior to was on anti biotics for  boils. She she states she normally gets yeast infections after taking anti biotics.She report symptoms were mild at first after finishing meds  but has gotten worse over last several days.She reports vaginal irritation and brownish discharge.She states she normally gets Diflucan. She is requesting to be notified if anti biotic sent or not. [facility]acy [facility] choice is [facility] [address]

## 2018-02-07 ENCOUNTER — Telehealth: Payer: Self-pay

## 2018-02-07 MED ORDER — FLUCONAZOLE 150 MG PO TABS: 150.0000 mg | ORAL_TABLET | Freq: Once | ORAL | 0 refills | Status: AC

## 2018-02-07 NOTE — Telephone Encounter (Signed)
Rx for diflucan sent to pharmacy. If itching continues then she'll need to schedule an office visit for further evaluation.

## 2018-02-07 NOTE — Telephone Encounter (Signed)
Fluconazole is okay for one time dose with her current medications.

## 2018-02-07 NOTE — Telephone Encounter (Signed)
Copied from [place] [id number]. Topic: General - Other >> Feb 07, 2018  8:25 AM Oneta Rack wrote: [facility]  Relation to pt - Pharmacist, Tammy  Call back number:  [phone]   Pharmacy: [facility] [address] [place], [place] [address] [phone]  Reason for call:  Pharmacy in need of clarification regarding fluconazole (DIFLUCAN) 150 MG tablet prescription stating there's a drug interaction with a current medication "ODEFSEY"  , please advise  >> Feb 07, 2018  8:39 AM Oneta Rack wrote: Caller name: [facility]  Relation to pt - Pharmacist, Tammy  Call back number:  [phone]   Pharmacy: [facility] [address], [place], [place] [address] [phone]  Reason for call:  Pharmacy in need of clarification regarding fluconazole (DIFLUCAN) 150 MG tablet prescription stating there's a drug interaction with a current medication "ODEFSEY"  , please advise

## 2018-02-07 NOTE — Telephone Encounter (Signed)
Spoken and notified patient of [name] Clark's comments. Patient verbalized understanding.  

## 2018-02-07 NOTE — Telephone Encounter (Signed)
[facility] and notified her of Tawni Millers comments.

## 2018-02-07 NOTE — Addendum Note (Signed)
Addended by: [name] on: 02/07/2018 07:54 AM   Modules accepted: Orders

## 2018-02-07 NOTE — Telephone Encounter (Signed)
Message left for patient to return my call.  

## 2018-02-28 ENCOUNTER — Other Ambulatory Visit: Payer: Self-pay | Admitting: Primary Care

## 2018-02-28 ENCOUNTER — Other Ambulatory Visit: Payer: Self-pay | Admitting: Internal Medicine

## 2018-03-02 ENCOUNTER — Other Ambulatory Visit: Payer: Self-pay | Admitting: Primary Care

## 2018-03-02 DIAGNOSIS — E890 Postprocedural hypothyroidism: Secondary | ICD-10-CM

## 2018-03-02 NOTE — Telephone Encounter (Signed)
Ok to refill? Electronically refill request for levothyroxine (SYNTHROID, LEVOTHROID) 175 MCG tablet  Last prescribed on 12/04/2017. Last seen on 12/29/2017.  Looks like patient cancel and no show the appointments with endo

## 2018-03-02 NOTE — Telephone Encounter (Signed)
Can you call and ask her if she plans on seeing endo. If not, she needs to return for follow up with Anda Kraft to check labs to see if her medication needs adjusted. Her last set of labs showed that she was taking too much medication.

## 2018-03-05 NOTE — Telephone Encounter (Signed)
Tried to call patient. Unable to leave message since voicemail box is full.

## 2018-03-05 NOTE — Telephone Encounter (Signed)
Please notify patient that I strongly advise she see endocrinology.  If she refuses then she'll need to have labs scheduled before I can refill her medication.  How many pills does she have left?

## 2018-03-05 NOTE — Telephone Encounter (Signed)
Spoken to patient. She stated that she needs a refill. I did ask her to schedule follow up and labs with Allie Bossier since she is not going to endo. She stated that she is willing to but do not time right now. Please advise.

## 2018-03-07 ENCOUNTER — Ambulatory Visit: Payer: Self-pay | Admitting: Internal Medicine

## 2018-03-07 NOTE — Telephone Encounter (Signed)
Spoken to patient and schedule a lab appt on 03/08/2018. Patient have about 5-6 tablets left.

## 2018-03-07 NOTE — Telephone Encounter (Signed)
Noted, will refill once labs have been received.

## 2018-03-08 ENCOUNTER — Other Ambulatory Visit (INDEPENDENT_AMBULATORY_CARE_PROVIDER_SITE_OTHER): Payer: Managed Care, Other (non HMO)

## 2018-03-08 DIAGNOSIS — E890 Postprocedural hypothyroidism: Secondary | ICD-10-CM | POA: Diagnosis not present

## 2018-03-08 LAB — T4, FREE: FREE T4: 0.94 ng/dL (ref 0.60–1.60)

## 2018-03-08 LAB — TSH: TSH: 7.97 u[IU]/mL — AB (ref 0.35–4.50)

## 2018-03-09 NOTE — Telephone Encounter (Signed)
Labs resulted. TSH elevated at 7. Call pt to ask if she has been out of medication.

## 2018-03-10 NOTE — Telephone Encounter (Signed)
Also needs repeat TSH testing in 6 weeks. Please schedule a lab only appointment. Given her history of thyroid cancer, I do recommend she still see endocrinology. Is she agreeable? Refill sent to pharmacy.

## 2018-03-16 NOTE — Telephone Encounter (Signed)
Message left for patient to return my call.  

## 2018-03-19 NOTE — Telephone Encounter (Signed)
Message left for patient to return my call.  

## 2018-03-21 ENCOUNTER — Encounter: Payer: Self-pay | Admitting: *Deleted

## 2018-03-21 NOTE — Telephone Encounter (Signed)
Sending letter for patient of [name] comments.

## 2018-04-06 ENCOUNTER — Other Ambulatory Visit: Payer: Self-pay | Admitting: Primary Care

## 2018-04-06 DIAGNOSIS — I10 Essential (primary) hypertension: Secondary | ICD-10-CM

## 2018-04-16 ENCOUNTER — Other Ambulatory Visit: Payer: Self-pay | Admitting: Primary Care

## 2018-04-16 DIAGNOSIS — R928 Other abnormal and inconclusive findings on diagnostic imaging of breast: Secondary | ICD-10-CM

## 2018-04-20 ENCOUNTER — Ambulatory Visit
Admission: RE | Admit: 2018-04-20 | Discharge: 2018-04-20 | Disposition: A | Discharge status: Home / Self Care | Payer: Managed Care, Other (non HMO) | Source: Ambulatory Visit | Attending: Primary Care | Admitting: Primary Care

## 2018-04-20 ENCOUNTER — Other Ambulatory Visit: Payer: Self-pay | Admitting: Primary Care

## 2018-04-20 DIAGNOSIS — N6310 Unspecified lump in the right breast, unspecified quadrant: Secondary | ICD-10-CM

## 2018-04-20 DIAGNOSIS — R928 Other abnormal and inconclusive findings on diagnostic imaging of breast: Secondary | ICD-10-CM

## 2018-04-20 DIAGNOSIS — R599 Enlarged lymph nodes, unspecified: Secondary | ICD-10-CM

## 2018-04-26 ENCOUNTER — Ambulatory Visit
Admission: RE | Admit: 2018-04-26 | Discharge: 2018-04-26 | Disposition: A | Discharge status: Home / Self Care | Payer: Managed Care, Other (non HMO) | Source: Ambulatory Visit | Attending: Primary Care | Admitting: Primary Care

## 2018-04-26 ENCOUNTER — Other Ambulatory Visit: Payer: Self-pay | Admitting: Primary Care

## 2018-04-26 DIAGNOSIS — R599 Enlarged lymph nodes, unspecified: Secondary | ICD-10-CM

## 2018-04-26 DIAGNOSIS — N6310 Unspecified lump in the right breast, unspecified quadrant: Secondary | ICD-10-CM

## 2018-04-26 DIAGNOSIS — E119 Type 2 diabetes mellitus without complications: Secondary | ICD-10-CM

## 2018-04-26 HISTORY — PX: BREAST EXCISIONAL BIOPSY: SUR124

## 2018-04-26 NOTE — Telephone Encounter (Signed)
Electronically refill request for metFORMIN (GLUCOPHAGE) 500 MG tablet  Last prescribed on 02/28/2018  #60  Last office visit on 12/29/2017 but last follow up on DM was 04/26/2017

## 2018-04-26 NOTE — Telephone Encounter (Signed)
Please notify patient that we cannot continue to refill her metformin without an office visit for diabetes follow up. We will refill for a 30 day supply until she can get in. It's very important for [place] to monitor her levels every 6 months.

## 2018-04-27 ENCOUNTER — Telehealth: Payer: Self-pay

## 2018-04-27 NOTE — Telephone Encounter (Signed)
Copied from [place] [phone]. 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 [phone] (Phone) [phone] (Fax)

## 2018-04-27 NOTE — Telephone Encounter (Signed)
She can try over the counter antifungal such as clotrimazole twice daily for the next 1-2 weeks. Have her see me if no improvement.

## 2018-04-27 NOTE — Telephone Encounter (Signed)
Pt last seen 12/29/17 and no future appt. Would pt need appt.?

## 2018-04-29 ENCOUNTER — Other Ambulatory Visit: Payer: Self-pay | Admitting: Primary Care

## 2018-04-29 DIAGNOSIS — E785 Hyperlipidemia, unspecified: Secondary | ICD-10-CM

## 2018-04-30 ENCOUNTER — Other Ambulatory Visit: Payer: Self-pay | Admitting: Surgery

## 2018-04-30 NOTE — Telephone Encounter (Signed)
Message left for patient to return my call.  

## 2018-05-01 ENCOUNTER — Encounter: Payer: Self-pay | Admitting: Oncology

## 2018-05-01 ENCOUNTER — Telehealth: Payer: Self-pay | Admitting: Oncology

## 2018-05-01 NOTE — Telephone Encounter (Signed)
New referral received from Dr. Ninfa Linden at [facility] for dx of breast cancer. Pt has been scheduled to see Dr. [name] on 7/25 at 4pm. Pt aware to arrive 30 minutes early. Letter mailed.

## 2018-05-02 ENCOUNTER — Encounter: Payer: Self-pay | Admitting: Radiation Oncology

## 2018-05-03 NOTE — Progress Notes (Signed)
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  Right axilla/ left axilla lymph node negative for carcinoma.   Lower inner quadrant of right breast near an area where she often has hidradenitis flare-ups.  Did patient present with symptoms (if so, please note symptoms) or was this found on screening mammography?:  Patient felt the mass her self.  Histology per Pathology Report: Right Breast, axillary lymph nodes right and left   Receptor Status: ER(+ 100%), PR (80% +), Her2-neu (+), Ki-67(70%)   Past/Anticipated interventions by surgeon, if any: Dr. Ninfa Linden 04/30/2018 -After long discussion I will be presenting her case in the multidisciplinary breast cancer conference. -We discussed breast conservation versus mastectomy. -It is difficult to say given the size right now whether she needs neoadjuvant therapy if she is genetically negative for the best cosmetic results. -We will refer her to medical and radiation oncology as well as for genetic testing.  Past/Anticipated interventions by medical oncology, if any: Chemotherapy  Appointment scheduled with Dr. [name] 05/10/2018 4pm.  Lymphedema issues, if any:  N/A, pt has not had breast surgery at this time.  Pain issues, if any:  Pt denies c/o pain. Pt states yesterday, breast was "throbbing".   Vitals BP (!) 163/98 (BP Location: Left Arm, Patient Position: Sitting, Cuff Size: Large)   Pulse 72   Temp 97.9 F (36.6 C) (Oral)   Resp 20   Ht '5\' 5"'$  (1.651 m)   Wt 246 lb 12.8 oz (111.9 kg)   LMP 03/31/2014 Comment: Negative Serum HCG 06/07/17  SpO2 100%   BMI 41.07 kg/m   Weights  Wt Readings from Last 3 Encounters:  05/10/18 246 lb 12.8 oz (111.9 kg)  12/29/17 248 lb 4 oz (112.6 kg)  10/13/17 248 lb (112.5 kg)     SAFETY ISSUES:  Prior radiation? No  Pacemaker/ICD? No  Possible current pregnancy? No  Is the patient on methotrexate? No  Current Complaints / other details:   -She  has had bilateral breast reductions Pt is anxious and scored a 8/10 on initial distress screening. SW consult has been placed per protocol. Pt prefers SW to contact her via phone. Pt is accompanied by mother.     Loma Sousa, RN 05/10/2018,1:45 PM

## 2018-05-08 ENCOUNTER — Other Ambulatory Visit: Payer: Self-pay | Admitting: Surgery

## 2018-05-08 DIAGNOSIS — C50911 Malignant neoplasm of unspecified site of right female breast: Secondary | ICD-10-CM

## 2018-05-08 NOTE — Progress Notes (Signed)
[place]  [phone] [phone]     ID: BEENA CATANO DOB: 02/02/1966  MR#: [id number]  VFI#:[id number]  Patient Care Team: [name], NP as PCP - General (Internal Medicine) Michel Bickers, MD as PCP - Infectious Diseases (Infectious Diseases) Kialee Kham, Virgie Dad, MD as Consulting Physician (Oncology) Kyung Rudd, MD as Consulting Physician (Radiation Oncology) Coralie Keens, MD as Consulting Physician (General Surgery) OTHER MD:   CHIEF COMPLAINT: Triple positive breast cancer  CURRENT TREATMENT: Neoadjuvant chemotherapy   HISTORY OF CURRENT ILLNESS: [name] (pronounced "Huff") palpated a mass in the right breast for about 2 weeks before bringing it to medical attention. She underwent bilateral diagnostic mammography with tomography and right breast ultrasonography at [facility] on 04/20/2018 showing: breast density category B. There was a highly suspicious mass located at 3 o'clock in the upper inner quadrant measuring 2.9 x 2.5 x 1.6 cm and 4 cm from the nipple. Ultrasound of the right axilla showed 5 lymph nodes with abnormal cortical thickening. The left axilla showed 3 lymph nodes with abnormal cortical thickening.  Accordingly on 04/26/2018 she proceeded to biopsy of the right breast area in question. The pathology from this procedure showed ([id number]): Invasive ductal carcinoma, grade II with calcifications. One right axillary lymph node and one left axillary lymph node were negative for carcinoma. Prognostic indicators significant for: estrogen receptor, 100% positive and progesterone receptor, 80% positive, both with strong staining intensity. Proliferation marker Ki67 at 70%. HER2 amplified with ratios HER2/CEP17 signals 4.52 and average HER2 copies per cell 10.85  The patient's subsequent history is as detailed below.  INTERVAL HISTORY: [name] was evaluated in the breast cancer clinic on 05/10/2018  accompanied by her mother. The patient's case was also presented at the multidisciplinary breast cancer conference on 05/02/2018 . At that time a preliminary plan was proposed: Genetics, port placement for chemotherapy and anti-HER-2 treatment, then breast conserving surgery and radiation, and management of hidradenitis   REVIEW OF SYSTEMS: [name] denies unusual headaches, visual changes, nausea, vomiting, or dizziness. There has been no unusual cough, phlegm production, or pleurisy. This been no change in bowel or bladder habits. She denies unexplained fatigue or unexplained weight loss, bleeding, rash, or fever. A detailed review of systems was otherwise stable.    PAST MEDICAL HISTORY: Past Medical History:  Diagnosis Date  . Anemia    Normocytic  . Anxiety   . Asthma   . Blood dyscrasia   . Bronchitis 2005  . 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 Diabetes mellitus without complication ([place])   . Gastroenteritis 07/10/07  . GERD 07/24/2006  . GRAVE'S DISEASE 01/01/2008  . History of hidradenitis suppurativa   . History of kidney stones   . History of thrush   . HIV DISEASE 07/24/2006   dx March 05  . HYPERTENSION 07/24/2006  . Hyperthyroidism 08/2006   Grave's Disease -diffuse radiotracer uptake 08/25/06 Thyroid scan-Cold nodule to R lower lobe of thyrorid  . Menometrorrhagia    hx of  . Nephrolithiasis   . 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 Pneumonia 2005  . 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 Sarcoidosis 02/08/2007   dx as a teenager in [place] from abnl CXR. Completed 2 yrs Prednisone after lung bx confirmation. No symptoms since then.  . Thyroid cancer ([place])   . THYROID NODULE, RIGHT 02/08/2007  -2018 thyroid nodules were precancerous but pathology was negative for thyroid cancer.   PAST  SURGICAL HISTORY: Past Surgical History:  Procedure Laterality Date  . BREAST SURGERY  1997   Breast  Reduction   . CYSTOSCOPY W/ URETERAL STENT REMOVAL  11/09/2012   Procedure: CYSTOSCOPY WITH STENT REMOVAL;  Surgeon: Alexis Frock, MD;  Location: [facility];  Service: Urology;  Laterality: Right;  . CYSTOSCOPY WITH RETROGRADE PYELOGRAM, URETEROSCOPY AND STENT PLACEMENT  11/09/2012   Procedure: CYSTOSCOPY WITH RETROGRADE PYELOGRAM, URETEROSCOPY AND STENT PLACEMENT;  Surgeon: Alexis Frock, MD;  Location: [facility];  Service: Urology;  Laterality: Left;  LEFT URETEROSCOPY, STONE MANIPULATION, left STENT exchange   . CYSTOSCOPY WITH STENT PLACEMENT  10/02/2012   Procedure: CYSTOSCOPY WITH STENT PLACEMENT;  Surgeon: Alexis Frock, MD;  Location: [facility];  Service: Urology;  Laterality: Left;  . DILATION AND CURETTAGE OF UTERUS  Feb 2004   s/p for 1st trimester nonviable pregnancy  . EYE SURGERY     sty under eyelid  . INCISE AND DRAIN ABCESS  Nov 03   s/p I &D for righ inframmary fold hidradenitis  . INCISION AND DRAINAGE PERITONSILLAR ABSCESS  Mar 03  . IRRIGATION AND DEBRIDEMENT ABSCESS  01/31/2012   Procedure: IRRIGATION AND DEBRIDEMENT ABSCESS;  Surgeon: Shann Medal, MD;  Location: [facility];  Service: General;  Laterality: Right;  right breast and axilla   . NEPHROLITHOTOMY  10/02/2012   Procedure: NEPHROLITHOTOMY PERCUTANEOUS;  Surgeon: Alexis Frock, MD;  Location: [facility];  Service: Urology;  Laterality: Right;  First Stage Percutaneous Nephrolithotomy with Surgeon Access, Left Ureteral Stent    . NEPHROLITHOTOMY  10/04/2012   Procedure: NEPHROLITHOTOMY PERCUTANEOUS SECOND LOOK;  Surgeon: Alexis Frock, MD;  Location: [facility];  Service: Urology;  Laterality: Right;     . NEPHROLITHOTOMY  10/08/2012   Procedure: NEPHROLITHOTOMY PERCUTANEOUS;  Surgeon: Alexis Frock, MD;  Location: [facility];  Service: Urology;  Laterality: Right;  THIRD STAGE, nephrostomy tube exchange x 2  . NEPHROLITHOTOMY  10/11/2012   Procedure: NEPHROLITHOTOMY PERCUTANEOUS SECOND LOOK;  Surgeon: Alexis Frock, MD;   Location: [facility];  Service: Urology;  Laterality: Right;  RIGHT 4 STAGE PERCUTANOUS NEPHROLITHOTOMY, right URETEROSCOPY WITH HOLMIUM LASER   . RADICAL NECK DISSECTION  04/12/2017   limited/notes 04/12/2017  . RADICAL NECK DISSECTION N/A 04/12/2017   Procedure: RADICAL NECK DISSECTION;  Surgeon: Melida Quitter, MD;  Location: [facility];  Service: ENT;  Laterality: N/A;  limited neck dissection 2 hours total  . REDUCTION MAMMAPLASTY Bilateral 1998  . [place]  1994  . THYROIDECTOMY  04/12/2017   completion/notes 04/12/2017  . THYROIDECTOMY N/A 04/12/2017   Procedure: THYROIDECTOMY;  Surgeon: Melida Quitter, MD;  Location: [facility];  Service: ENT;  Laterality: N/A;  Completion Thyroidectomy  . TOTAL THYROIDECTOMY  2010  Thyroid nodules removed- pathology at the ENT doctor 2018 were benign -Over active thyroid and thyroidectomy with benign pathology  FAMILY HISTORY Family History  Problem Relation Age of Onset  . Hypertension Mother   . Cancer Mother        throat  . Heart disease Mother        stent  . Hypertension Father   . Cancer Other        FH of Breast Cancer  . Cancer Other   . Heart disease Other   . Hypertension Other   . Stroke Other        Grandparent  . Kidney disease Other        Grandparent  . Diabetes Other        FH of Diabetes  .  Hypertension Sister   . Cancer Maternal Uncle        Stomach Cancer, Lung CA  . Hypertension Brother   . Hypertension Sister   . Breast cancer Maternal Aunt   . Breast cancer Paternal Aunt 25  The patient's mother is alive at age 68. The patient's father died at 7 from lung cancer (heavy smoker). She does not know much about her father. The patient has 1 brother and 2 sisters. The patient's mother had a history of laryngeal cancer. There was a maternal cousin with breast cancer diagnosed at age 12. There were 2 maternal aunts with breast cancer, the youngest being diagnosed in the 23's. There was a maternal great aunt with breast cancer. There were  2 paternal aunts with breast cancer.    GYNECOLOGIC HISTORY:  Patient's last menstrual period was 03/31/2014. Menarche: 52 years old Age at first live birth: 52 years old She is GX P1.  Her LMP was in 2017. She did not use HRT.    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 At home is the patient's daughter, Verdie Drown, age 68, who is a Engineer, technical sales education and working part-time at the [employer]. The patient attends [employer].    ADVANCED DIRECTIVES: Not in place; at the 05/10/2018 visit the patient was given the appropriate documents to complete and notarized at her discretion   HEALTH MAINTENANCE: Social History   Tobacco Use  . Smoking status: Former Smoker    Packs/day: 0.50    Years: 15.00    Pack years: 7.50    Types: Cigarettes    Start date: 04/12/2017  . Smokeless tobacco: Never Used  Substance Use Topics  . Alcohol use: No    Alcohol/week: 0.0 oz  . Drug use: No     Colonoscopy: no  PAP: [facility]. 2018  Bone density: remote   Allergies  Allergen Reactions  . Tape Rash    Rash   . Genvoya [Elviteg-Cobic-Emtricit-Tenofaf] Hives  . Lisinopril Cough  . Xylocaine [Lidocaine]     Unknown , patient states she has had lidocaine for IV starts and at the dentist with no problems  . Valsartan Cough    Pt states she tolerates medicine now    Current Outpatient Medications  Medication Sig Dispense Refill  . amLODipine (NORVASC) 10 MG tablet Take 1 tablet (10 mg total) by mouth daily. 90 tablet 0  . atorvastatin (LIPITOR) 20 MG tablet Take 1 tablet by mouth every evening for high cholesterol. NEED APPOINTMENT FOR ANY MORE REFILLS 30 tablet 0  . glucose blood (ONETOUCH VERIO) test strip Use as instructed to test blood sugar daily 100 each 5  . hydrALAZINE (APRESOLINE) 25 MG tablet TAKE 1 TABLET BY MOUTH THREE TIMES A DAY 270 tablet 1  . irbesartan-hydrochlorothiazide (AVALIDE) 150-12.5 MG tablet TAKE 1  TABLET BY MOUTH ONCE DAILY FOR BLOOD PRESSURE. 90 tablet 1  . levothyroxine (SYNTHROID, LEVOTHROID) 175 MCG tablet TAKE 1 TABLET BY MOUTH EVERY MORNING ON AN EMPTY STOMACH WITH A FULL GLASS OF WATER. 90 tablet 0  . metFORMIN (GLUCOPHAGE) 500 MG tablet TAKE 1 TABLET BY MOUTH TWICE A DAY WITH A MEAL (DUE FOR APPT FOR ADDITIONAL REFILLS) 60 tablet 0  . metoprolol succinate (TOPROL-XL) 50 MG 24 hr tablet TAKE 1 TABLET (50 MG TOTAL) BY MOUTH DAILY. TAKE WITH OR IMMEDIATELY FOLLOWING A MEAL. 90 tablet 1  . mometasone (NASONEX) 50 MCG/ACT nasal spray PLACE 2 SPRAYS INTO  THE NOSE DAILY. 17 g 3  . Multiple Vitamin (MULTIVITAMIN) tablet Take 1 tablet by mouth daily.    . ODEFSEY 200-25-25 MG TABS tablet TAKE 1 TABLET BY MOUTH DAILY. 30 tablet 5  . ONE TOUCH LANCETS MISC Use as instructed to test blood sugar daily 200 each 5  . pantoprazole (PROTONIX) 40 MG tablet Take 40 mg by mouth daily as needed (for indigestion/acid reflux.).     Marland Kitchen PROAIR RESPICLICK 474 (90 Base) MCG/ACT AEPB INHALE 2 PUFFS INTO THE LUNGS EVERY 6 HOURS AS NEEDED FOR WHEEZING OR SHORTNESS OF BREATH. 1 each 2   No current facility-administered medications for this visit.     OBJECTIVE: Middle-aged African-American woman in no acute distress  Vitals:   05/10/18 1530  BP: (!) 145/90  Pulse: 69  Resp: 20  Temp: 98.3 F (36.8 C)  SpO2: 99%     Body mass index is 41.07 kg/m.   Wt Readings from Last 3 Encounters:  05/10/18 246 lb 12.8 oz (111.9 kg)  05/10/18 246 lb 12.8 oz (111.9 kg)  12/29/17 248 lb 4 oz (112.6 kg)      ECOG FS:1 - Symptomatic but completely ambulatory  Ocular: Sclerae unicteric, pupils round and equal Ear-nose-throat: Oropharynx clear and moist Lymphatic: No cervical or supraclavicular adenopathy Lungs no rales or rhonchi Heart regular rate and rhythm Abd soft, obese, nontender, positive bowel sounds MSK no focal spinal tenderness, no joint edema Neuro: non-focal, well-oriented, appropriate  affect Breasts: Both breasts are status post reduction mammoplasty.  On the right, in the lower inner quadrant, there is a palpable mass measuring approximately 3 cm.  There is no overlying erythema.  The left breast is unremarkable.  Both axillae are benign.   LAB RESULTS:  CMP     Component Value Date/Time   NA 144 05/10/2018 1458   K 3.3 (L) 05/10/2018 1458   CL 105 05/10/2018 1458   CO2 28 05/10/2018 1458   GLUCOSE 74 05/10/2018 1458   BUN 19 05/10/2018 1458   CREATININE 1.28 (H) 05/10/2018 1458   CREATININE 1.02 09/23/2015 1047   CALCIUM 9.6 05/10/2018 1458   PROT 8.5 (H) 05/10/2018 1458   ALBUMIN 3.8 05/10/2018 1458   AST 28 05/10/2018 1458   ALT 23 05/10/2018 1458   ALKPHOS 106 05/10/2018 1458   BILITOT <0.2 (L) 05/10/2018 1458   GFRNONAA 48 (L) 05/10/2018 1458   GFRAA 56 (L) 05/10/2018 1458    No results found for: TOTALPROTELP, ALBUMINELP, A1GS, A2GS, BETS, BETA2SER, GAMS, MSPIKE, SPEI  No results found for: KPAFRELGTCHN, LAMBDASER, KAPLAMBRATIO  Lab Results  Component Value Date   WBC 8.1 05/10/2018   NEUTROABS 3.9 05/10/2018   HGB 11.8 05/10/2018   HCT 36.3 05/10/2018   MCV 85.2 05/10/2018   PLT 334 05/10/2018    '@LASTCHEMISTRY'$ @  No results found for: LABCA2  No components found for: [id number]  No results for input(s): INR in the last 168 hours.  No results found for: LABCA2  No results found for: IEP329  No results found for: JJO841  No results found for: YSA630  No results found for: CA2729  No components found for: HGQUANT  No results found for: CEA1 / No results found for: CEA1   No results found for: AFPTUMOR  No results found for: CHROMOGRNA  No results found for: PSA1  Appointment on 05/10/2018  Component Date Value Ref Range Status  . Sodium 05/10/2018 144  135 - 145 mmol/L Final  . Potassium 05/10/2018 3.3* 3.5 -  5.1 mmol/L Final  . Chloride 05/10/2018 105  98 - 111 mmol/L Final  . CO2 05/10/2018 28  22 - 32 mmol/L Final   . Glucose, Bld 05/10/2018 74  70 - 99 mg/dL Final  . BUN 05/10/2018 19  6 - 20 mg/dL Final  . Creatinine 05/10/2018 1.28* 0.44 - 1.00 mg/dL Final  . Calcium 05/10/2018 9.6  8.9 - 10.3 mg/dL Final  . Total Protein 05/10/2018 8.5* 6.5 - 8.1 g/dL Final  . Albumin 05/10/2018 3.8  3.5 - 5.0 g/dL Final  . AST 05/10/2018 28  15 - 41 U/L Final  . ALT 05/10/2018 23  0 - 44 U/L Final  . Alkaline Phosphatase 05/10/2018 106  38 - 126 U/L Final  . Total Bilirubin 05/10/2018 <0.2* 0.3 - 1.2 mg/dL Final  . GFR, Est Non Af Am 05/10/2018 48* >60 mL/min Final  . GFR, Est AFR Am 05/10/2018 56* >60 mL/min Final   Comment: (NOTE) The eGFR has been calculated using the CKD EPI equation. This calculation has not been validated in all clinical situations. eGFR's persistently <60 mL/min signify possible Chronic Kidney Disease.   Georgiann Hahn gap 05/10/2018 11  5 - 15 Final   Performed at [facility], [place] [address], [place], [place] [address]  . WBC Count 05/10/2018 8.1  3.9 - 10.3 K/uL Final  . RBC 05/10/2018 4.26  3.70 - 5.45 MIL/uL Final  . Hemoglobin 05/10/2018 11.8  11.6 - 15.9 g/dL Final  . HCT 05/10/2018 36.3  34.8 - 46.6 % Final  . MCV 05/10/2018 85.2  79.5 - 101.0 fL Final  . MCH 05/10/2018 27.7  25.1 - 34.0 pg Final  . MCHC 05/10/2018 32.5  31.5 - 36.0 g/dL Final  . RDW 05/10/2018 17.6* 11.2 - 14.5 % Final  . Platelet Count 05/10/2018 334  145 - 400 K/uL Final  . Neutrophils Relative % 05/10/2018 48  % Final  . Neutro Abs 05/10/2018 3.9  1.5 - 6.5 K/uL Final  . Lymphocytes Relative 05/10/2018 42  % Final  . Lymphs Abs 05/10/2018 3.4* 0.9 - 3.3 K/uL Final  . Monocytes Relative 05/10/2018 8  % Final  . Monocytes Absolute 05/10/2018 0.6  0.1 - 0.9 K/uL Final  . Eosinophils Relative 05/10/2018 2  % Final  . Eosinophils Absolute 05/10/2018 0.2  0.0 - 0.5 K/uL Final  . Basophils Relative 05/10/2018 0  % Final  . Basophils Absolute 05/10/2018 0.0  0.0 - 0.1 K/uL Final    Performed at [facility], [address] [place], [place] [address]    (this displays the last labs from the last 3 days)  No results found for: TOTALPROTELP, ALBUMINELP, A1GS, A2GS, BETS, BETA2SER, GAMS, MSPIKE, SPEI (this displays SPEP labs)  No results found for: KPAFRELGTCHN, LAMBDASER, KAPLAMBRATIO (kappa/lambda light chains)  No results found for: HGBA, HGBA2QUANT, HGBFQUANT, HGBSQUAN (Hemoglobinopathy evaluation)   No results found for: LDH  Lab Results  Component Value Date   IRON 26 (L) 02/08/2007   TIBC 371 02/08/2007   IRONPCTSAT 7 (L) 02/08/2007   (Iron and TIBC)  No results found for: FERRITIN  Urinalysis    Component Value Date/Time   COLORURINE YELLOW 10/21/2014 2347   APPEARANCEUR CLOUDY (A) 10/21/2014 2347   LABSPEC 1.014 10/21/2014 2347   PHURINE 6.0 10/21/2014 2347   GLUCOSEU NEGATIVE 10/21/2014 2347   GLUCOSEU NEG mg/dL 01/23/2008 1418   HGBUR LARGE (A) 10/21/2014 2347   BILIRUBINUR negative 12/29/2017 [place]  NEGATIVE 10/21/2014 2347   PROTEINUR 2+ 12/29/2017 1046   PROTEINUR 100 (A) 10/21/2014 2347   UROBILINOGEN 0.2 12/29/2017 1046   UROBILINOGEN 0.2 10/21/2014 2347   NITRITE negative 12/29/2017 1046   NITRITE NEGATIVE 10/21/2014 2347   LEUKOCYTESUR Large (3+) (A) 12/29/2017 1046     STUDIES: US Breast Ltd Uni Right Inc Axilla  Result Date: 04/20/2018 CLINICAL DATA:  Mass felt by the patient in the upper inner right breast for the past 1.5 weeks. Status post bilateral breast reduction in 1998. History of breast cancer in a maternal aunt and paternal aunt. The patient did not return for a recommended 6 month follow-up for a probably benign mass and probably benign calcifications on the right and probable reactive left axillary lymph nodes in 2016.Multiple skin boils in both breasts. EXAM: DIGITAL DIAGNOSTIC BILATERAL MAMMOGRAM WITH CAD AND TOMO ULTRASOUND RIGHT BREAST ULTRASOUND LEFT AXILLA COMPARISON:   Previous mammogram and ultrasound examinations in 2016. ACR Breast Density Category b: There are scattered areas of fibroglandular density. FINDINGS: There is an irregular mass with some circumscribed and some indistinct margins in the medial right breast in the region of the mass felt by the patient, marked with a metallic marker. There are associated pleomorphic calcifications within the mass and immediately adjacent to the mass. There are bilateral enlarged axillary lymph nodes. The previously evaluated probably benign calcifications in the 6 o'clock position of the right breast are unchanged. On today's 3D images, these are shown to be within the skin. Additional bilateral benign dermal calcifications are also stable. No mammographic findings in the left breast suspicious for malignancy. Mammographic images were processed with CAD. On physical exam, the patient has multiple skin boils in various stages of development throughout both breasts and both axillae. There is an approximately 3 cm mildly irregular palpable mass in the 3 o'clock position of the right breast, 4 cm from the nipple. There are 2 palpable masses in each axilla. Due to the skin changes, it is difficult to determine if these are palpable nodes or areas of focal skin thickening/scarring from her multiple boils. Targeted ultrasound is performed, showing a 2.9 x 2.5 x 1.6 cm irregular, heterogeneous, predominantly hypoechoic mass containing calcifications in the 3 o'clock position of the right breast, 4 cm from the nipple. Ultrasound of the right axilla demonstrates 5 lymph nodes with abnormal cortical thickening. The 2 largest nodes are in the inferior aspect of the axilla, including the areas of palpable abnormality. Ultrasound of the left axilla demonstrates 3 lymph nodes with abnormal cortical thickening. The 2 largest are in the inferior axilla and include the areas of palpable abnormality. IMPRESSION: 1. 2.9 x 2.5 x 1.6 cm palpable mass with  imaging features highly suspicious for malignancy in the 3 o'clock position of the right breast. 2. Multiple bilateral abnormal appearing axillary lymph nodes. These could represent metastatic nodes and/or reactive nodes. RECOMMENDATION: 1. Ultrasound-guided core needle biopsy of the 2.9 cm mass in the 3 o'clock position of the right breast. 2. Ultrasound-guided core needle biopsy of one of the larger, more inferior right axillary lymph nodes. 3. Ultrasound-guided core needle biopsy of one of the larger, more inferior left axillary lymph nodes. The biopsy recommendations have been discussed with the patient and the biopsies have been scheduled at 3:45 p.m. on 04/26/2018. I have discussed the findings and recommendations with the patient. Results were also provided in writing at the conclusion of the visit. If applicable, a reminder letter will be sent to the patient regarding  the next appointment. BI-RADS CATEGORY  5: Highly suggestive of malignancy. Electronically Signed   By: Claudie Revering M.D.   On: 04/20/2018 13:06   Mm Diag Breast Tomo Bilateral  Result Date: 04/20/2018 CLINICAL DATA:  Mass felt by the patient in the upper inner right breast for the past 1.5 weeks. Status post bilateral breast reduction in 1998. History of breast cancer in a maternal aunt and paternal aunt. The patient did not return for a recommended 6 month follow-up for a probably benign mass and probably benign calcifications on the right and probable reactive left axillary lymph nodes in 2016.Multiple skin boils in both breasts. EXAM: DIGITAL DIAGNOSTIC BILATERAL MAMMOGRAM WITH CAD AND TOMO ULTRASOUND RIGHT BREAST ULTRASOUND LEFT AXILLA COMPARISON:  Previous mammogram and ultrasound examinations in 2016. ACR Breast Density Category b: There are scattered areas of fibroglandular density. FINDINGS: There is an irregular mass with some circumscribed and some indistinct margins in the medial right breast in the region of the mass felt by the  patient, marked with a metallic marker. There are associated pleomorphic calcifications within the mass and immediately adjacent to the mass. There are bilateral enlarged axillary lymph nodes. The previously evaluated probably benign calcifications in the 6 o'clock position of the right breast are unchanged. On today's 3D images, these are shown to be within the skin. Additional bilateral benign dermal calcifications are also stable. No mammographic findings in the left breast suspicious for malignancy. Mammographic images were processed with CAD. On physical exam, the patient has multiple skin boils in various stages of development throughout both breasts and both axillae. There is an approximately 3 cm mildly irregular palpable mass in the 3 o'clock position of the right breast, 4 cm from the nipple. There are 2 palpable masses in each axilla. Due to the skin changes, it is difficult to determine if these are palpable nodes or areas of focal skin thickening/scarring from her multiple boils. Targeted ultrasound is performed, showing a 2.9 x 2.5 x 1.6 cm irregular, heterogeneous, predominantly hypoechoic mass containing calcifications in the 3 o'clock position of the right breast, 4 cm from the nipple. Ultrasound of the right axilla demonstrates 5 lymph nodes with abnormal cortical thickening. The 2 largest nodes are in the inferior aspect of the axilla, including the areas of palpable abnormality. Ultrasound of the left axilla demonstrates 3 lymph nodes with abnormal cortical thickening. The 2 largest are in the inferior axilla and include the areas of palpable abnormality. IMPRESSION: 1. 2.9 x 2.5 x 1.6 cm palpable mass with imaging features highly suspicious for malignancy in the 3 o'clock position of the right breast. 2. Multiple bilateral abnormal appearing axillary lymph nodes. These could represent metastatic nodes and/or reactive nodes. RECOMMENDATION: 1. Ultrasound-guided core needle biopsy of the 2.9 cm  mass in the 3 o'clock position of the right breast. 2. Ultrasound-guided core needle biopsy of one of the larger, more inferior right axillary lymph nodes. 3. Ultrasound-guided core needle biopsy of one of the larger, more inferior left axillary lymph nodes. The biopsy recommendations have been discussed with the patient and the biopsies have been scheduled at 3:45 p.m. on 04/26/2018. I have discussed the findings and recommendations with the patient. Results were also provided in writing at the conclusion of the visit. If applicable, a reminder letter will be sent to the patient regarding the next appointment. BI-RADS CATEGORY  5: Highly suggestive of malignancy. Electronically Signed   By: Claudie Revering M.D.   On: 04/20/2018 13:06   [place]  Axillary Node Core Biopsy Left  Addendum Date: 04/30/2018   ADDENDUM REPORT: 04/30/2018 08:29 ADDENDUM: Pathology revealed GRADE II - INVASIVE DUCTAL CARCINOMA WITH CALCIFICATIONS, INTERMEDIATE TO NUCLEAR GRADE - DUCTAL CARCINOMA IN SITU (DCIS) OF RIGHT BREAST, 3:30 o'clock. This was found to be concordant by Dr. Lajean Manes. Pathology revealed LYMPH NODE, NEGATIVE FOR CARCINOMA OF RIGHT AXILLA. This was found to be concordant by Dr. Lajean Manes. Pathology revealed LYMPH NODE, NEGATIVE FOR CARCINOMA of LEFT AXILLA. This was found to be concordant by Dr. Lajean Manes. Pathology results were discussed with the patient by telephone. The patient reported doing well after the biopsy with tenderness at the site. Post biopsy instructions and care were reviewed and questions were answered. The patient was encouraged to call [facility] for any additional concerns. Surgical consultation has been arranged with Dr. Coralie Keens at [facility] on April 30, 2018. Pathology results reported by Roselind Messier, RN on 04/30/2018. Electronically Signed   By: Lajean Manes M.D.   On: 04/30/2018 08:29   Result Date: 04/30/2018 CLINICAL DATA:  Patient  presents for ultrasound-guided core needle biopsy a medial right breast mass located at the 3:30 o'clock position of the right breast. She also will be undergoing ultrasound-guided core needle biopsy abnormal right and left axillary lymph nodes. EXAM: ULTRASOUND GUIDED RIGHT BREAST CORE NEEDLE BIOPSY ULTRASOUND GUIDED RIGHT AXILLARY LYMPH NODE CORE NEEDLE BIOPSY ULTRASOUND GUIDED LEFT AXILLARY LYMPH NODE CORE NEEDLE BIOPSY COMPARISON:  Previous exam(s). FINDINGS: I met with the patient and we discussed the procedure of ultrasound-guided biopsy, including benefits and alternatives. We discussed the high likelihood of a successful procedure. We discussed the risks of the procedure, including infection, bleeding, tissue injury, clip migration, and inadequate sampling. Informed written consent was given. The usual time-out protocol was performed immediately prior to the procedure. LESION #1 Lesion quadrant: Lower inner quadrant Using sterile technique and 1% Lidocaine as local anesthetic, under direct ultrasound visualization, a 12 gauge spring-loaded device was used to perform biopsy of the 3:30 o'clock position right breast mass using an inferior approach. At the conclusion of the procedure a ribbon shaped tissue marker clip was deployed into the biopsy cavity. Follow up 2 view mammogram was performed and dictated separately. LESION #2 Using sterile technique and 1% Lidocaine as local anesthetic, under direct ultrasound visualization, a 14 gauge spring-loaded device was used to perform biopsy of 1 of the abnormal right axillary lymph nodes using a lateral approach. At the conclusion of the procedure a HydroMARK tissue marker clip was deployed into the biopsy cavity. LESION #3 Using sterile technique and 1% Lidocaine as local anesthetic, under direct ultrasound visualization, a 14 gauge spring-loaded device was used to perform biopsy of 1 of the abnormal left axillary lymph nodes using an inferior, lateral approach. At  the conclusion of the procedure a HydroMARK tissue marker clip was deployed into the biopsy cavity. IMPRESSION: Ultrasound guided biopsy of 3 lesions, a medial, 3:30 o'clock, right breast mass and bilateral abnormal axillary lymph nodes. No apparent complications. Electronically Signed: By: Lajean Manes M.D. 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 This was found to be concordant by Dr. Lajean Manes. Pathology revealed LYMPH NODE, NEGATIVE FOR CARCINOMA OF RIGHT AXILLA. This was found to  be concordant by Dr. Lajean Manes. Pathology revealed LYMPH NODE, NEGATIVE FOR CARCINOMA of LEFT AXILLA. This was found to be concordant by Dr. Lajean Manes. Pathology results were discussed with the patient by telephone. The patient reported doing well after the biopsy with tenderness at the site. Post biopsy instructions and care were reviewed and questions were answered. The patient was encouraged to call [facility] for any additional concerns. Surgical consultation has been arranged with Dr. Coralie Ke[facility] at [facility] on April 30, 2018. Pathology results reported by Roselind Messier, RN on 04/30/2018. Electronically Signed   By: Lajean Manes M.D.   On: 04/30/2018 08:29   Result Date: 04/30/2018 CLINICAL DATA:  Patient presents for ultrasound-guided core needle biopsy a medial right breast mass located at the 3:30 o'clock position of the right breast. She also will be undergoing ultrasound-guided core needle biopsy abnormal right and left axillary lymph nodes. EXAM: ULTRASOUND GUIDED RIGHT BREAST CORE NEEDLE BIOPSY ULTRASOUND GUIDED RIGHT AXILLARY LYMPH NODE CORE NEEDLE BIOPSY ULTRASOUND GUIDED LEFT AXILLARY LYMPH NODE CORE  NEEDLE BIOPSY COMPARISON:  Previous exam(s). FINDINGS: I met with the patient and we discussed the procedure of ultrasound-guided biopsy, including benefits and alternatives. We discussed the high likelihood of a successful procedure. We discussed the risks of the procedure, including infection, bleeding, tissue injury, clip migration, and inadequate sampling. Informed written consent was given. The usual time-out protocol was performed immediately prior to the procedure. LESION #1 Lesion quadrant: Lower inner quadrant Using sterile technique and 1% Lidocaine as local anesthetic, under direct ultrasound visualization, a 12 gauge spring-loaded device was used to perform biopsy of the 3:30 o'clock position right breast mass using an inferior approach. At the conclusion of the procedure a ribbon shaped tissue marker clip was deployed into the biopsy cavity. Follow up 2 view mammogram was performed and dictated separately. LESION #2 Using sterile technique and 1% Lidocaine as local anesthetic, under direct ultrasound visualization, a 14 gauge spring-loaded device was used to perform biopsy of 1 of the abnormal right axillary lymph nodes using a lateral approach. At the conclusion of the procedure a HydroMARK tissue marker clip was deployed into the biopsy cavity. LESION #3 Using sterile technique and 1% Lidocaine as local anesthetic, under direct ultrasound visualization, a 14 gauge spring-loaded device was used to perform biopsy of 1 of the abnormal left axillary lymph nodes using an inferior, lateral approach. At the conclusion of the procedure a HydroMARK tissue marker clip was deployed into the biopsy cavity. IMPRESSION: Ultrasound guided biopsy of 3 lesions, a medial, 3:30 o'clock, right breast mass and bilateral abnormal axillary lymph nodes. No apparent complications. Electronically Signed: By: Lajean Manes M.D. On: 04/26/2018 16:41   [place] Axilla Left  Result Date: 04/20/2018 CLINICAL DATA:  Mass felt by the  patient in the upper inner right breast for the past 1.5 weeks. Status post bilateral breast reduction in 1998. History of breast cancer in a maternal aunt and paternal aunt. The patient did not return for a recommended 6 month follow-up for a probably benign mass and probably benign calcifications on the right and probable reactive left axillary lymph nodes in 2016.Multiple skin boils in both breasts. EXAM: DIGITAL DIAGNOSTIC BILATERAL MAMMOGRAM WITH CAD AND TOMO ULTRASOUND RIGHT BREAST ULTRASOUND LEFT AXILLA COMPARISON:  Previous mammogram and ultrasound examinations in 2016. ACR Breast Density Category b: There are scattered areas of fibroglandular density. FINDINGS: There is an irregular mass with some circumscribed and some indistinct margins in  the medial right breast in the region of the mass felt by the patient, marked with a metallic marker. There are associated pleomorphic calcifications within the mass and immediately adjacent to the mass. There are bilateral enlarged axillary lymph nodes. The previously evaluated probably benign calcifications in the 6 o'clock position of the right breast are unchanged. On today's 3D images, these are shown to be within the skin. Additional bilateral benign dermal calcifications are also stable. No mammographic findings in the left breast suspicious for malignancy. Mammographic images were processed with CAD. On physical exam, the patient has multiple skin boils in various stages of development throughout both breasts and both axillae. There is an approximately 3 cm mildly irregular palpable mass in the 3 o'clock position of the right breast, 4 cm from the nipple. There are 2 palpable masses in each axilla. Due to the skin changes, it is difficult to determine if these are palpable nodes or areas of focal skin thickening/scarring from her multiple boils. Targeted ultrasound is performed, showing a 2.9 x 2.5 x 1.6 cm irregular, heterogeneous, predominantly hypoechoic mass  containing calcifications in the 3 o'clock position of the right breast, 4 cm from the nipple. Ultrasound of the right axilla demonstrates 5 lymph nodes with abnormal cortical thickening. The 2 largest nodes are in the inferior aspect of the axilla, including the areas of palpable abnormality. Ultrasound of the left axilla demonstrates 3 lymph nodes with abnormal cortical thickening. The 2 largest are in the inferior axilla and include the areas of palpable abnormality. IMPRESSION: 1. 2.9 x 2.5 x 1.6 cm palpable mass with imaging features highly suspicious for malignancy in the 3 o'clock position of the right breast. 2. Multiple bilateral abnormal appearing axillary lymph nodes. These could represent metastatic nodes and/or reactive nodes. RECOMMENDATION: 1. Ultrasound-guided core needle biopsy of the 2.9 cm mass in the 3 o'clock position of the right breast. 2. Ultrasound-guided core needle biopsy of one of the larger, more inferior right axillary lymph nodes. 3. Ultrasound-guided core needle biopsy of one of the larger, more inferior left axillary lymph nodes. The biopsy recommendations have been discussed with the patient and the biopsies have been scheduled at 3:45 p.m. on 04/26/2018. I have discussed the findings and recommendations with the patient. Results were also provided in writing at the conclusion of the visit. If applicable, a reminder letter will be sent to the patient regarding the next appointment. BI-RADS CATEGORY  5: Highly suggestive of malignancy. Electronically Signed   By: Claudie Revering M.D.   On: 04/20/2018 13:06   Mm Clip Placement Right  Result Date: 04/26/2018 CLINICAL DATA:  Status post ultrasound-guided core needle biopsy of a right breast mass and an abnormal right axillary lymph node. Patient also had a left axillary lymph node biopsied, the HydroMARK clip deploying in the node well seen on ultrasound. EXAM: DIAGNOSTIC RIGHT MAMMOGRAM POST ULTRASOUND BIOPSY COMPARISON:  Previous  exam(s). FINDINGS: Mammographic images were obtained following ultrasound guided biopsy of a medial right breast mass and an abnormal right axillary lymph node. The ribbon shaped biopsy clip lies within the medial right breast mass. The spiral shaped HydroMARK clip lies within the largest abnormal axillary lymph node. IMPRESSION: Well positioned post biopsy clips following ultrasound-guided core needle biopsy of a right breast mass and right axillary lymph node. Final Assessment: Post Procedure Mammograms for Marker Placement Electronically Signed   By: Lajean Manes M.D.   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 This was found to be concordant by Dr. Lajean Manes. Pathology revealed LYMPH NODE, NEGATIVE FOR CARCINOMA OF RIGHT AXILLA. This was found to be concordant by Dr. Lajean Manes. Pathology revealed LYMPH NODE, NEGATIVE FOR CARCINOMA of LEFT AXILLA. This was found to be concordant by Dr. Lajean Manes. Pathology results were discussed with the patient by telephone. The patient reported doing well after the biopsy with tenderness at the site. Post biopsy instructions and care were reviewed and questions were answered. The patient was encouraged to call [facility] for any additional concerns. Surgical consultation has been arranged with Dr. Coralie Keens at [facility] on April 30, 2018. Pathology results reported by Roselind Messier, RN on 04/30/2018. Electronically Signed   By: Lajean Manes M.D.   On: 04/30/2018 08:29   Result Date: 04/30/2018 CLINICAL DATA:  Patient presents for ultrasound-guided core needle biopsy a medial right breast mass located at the 3:30 o'clock position of the  right breast. She also will be undergoing ultrasound-guided core needle biopsy abnormal right and left axillary lymph nodes. EXAM: ULTRASOUND GUIDED RIGHT BREAST CORE NEEDLE BIOPSY ULTRASOUND GUIDED RIGHT AXILLARY LYMPH NODE CORE NEEDLE BIOPSY ULTRASOUND GUIDED LEFT AXILLARY LYMPH NODE CORE NEEDLE BIOPSY COMPARISON:  Previous exam(s). FINDINGS: I met with the patient and we discussed the procedure of ultrasound-guided biopsy, including benefits and alternatives. We discussed the high likelihood of a successful procedure. We discussed the risks of the procedure, including infection, bleeding, tissue injury, clip migration, and inadequate sampling. Informed written consent was given. The usual time-out protocol was performed immediately prior to the procedure. LESION #1 Lesion quadrant: Lower inner quadrant Using sterile technique and 1% Lidocaine as local anesthetic, under direct ultrasound visualization, a 12 gauge spring-loaded device was used to perform biopsy of the 3:30 o'clock position right breast mass using an inferior approach. At the conclusion of the procedure a ribbon shaped tissue marker clip was deployed into the biopsy cavity. Follow up 2 view mammogram was performed and dictated separately. LESION #2 Using sterile technique and 1% Lidocaine as local anesthetic, under direct ultrasound visualization, a 14 gauge spring-loaded device was used to perform biopsy of 1 of the abnormal right axillary lymph nodes using a lateral approach. At the conclusion of the procedure a HydroMARK tissue marker clip was deployed into the biopsy cavity. LESION #3 Using sterile technique and 1% Lidocaine as local anesthetic, under direct ultrasound visualization, a 14 gauge spring-loaded device was used to perform biopsy of 1 of the abnormal left axillary lymph nodes using an inferior, lateral approach. At the conclusion of the procedure a HydroMARK tissue marker clip was deployed into the biopsy cavity. IMPRESSION:  Ultrasound guided biopsy of 3 lesions, a medial, 3:30 o'clock, right breast mass and bilateral abnormal axillary lymph nodes. No apparent complications. Electronically Signed: By: Lajean Manes M.D. On: 04/26/2018 16:41    ELIGIBLE FOR AVAILABLE RESEARCH PROTOCOL: UPBEAT  ASSESSMENT: 52 y.o. 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  (1) genetics testing pending  (2) neoadjuvant chemotherapy will consist of carboplatin, docetaxel, trastuzumab and Pertuzumab given every 21 days x 6, starting 05/31/2018  (  3) trastuzumab to continue to total 6 months  (4) definitive surgery to follow chemotherapy  (5) adjuvant radiation to follow surgery  (6) antiestrogens to start at the completion of local treatment    PLAN: We spent more than 50% of today's hour-long appointment in counseling and coordination of care regarding the biology of the patient's diagnosis and the specifics of her situation. We first reviewed the fact that cancer is not one disease but more than 100 different diseases and that it is important to keep them separate-- otherwise when friends and relatives discuss their own cancer experiences with Aara confusion can result. Similarly we explained that if breast cancer spreads to the bone or liver, the patient would not have bone cancer or liver cancer, but breast cancer in the bone and breast cancer in the liver: one cancer in three places-- not 3 different cancers which otherwise would have to be treated in 3 different ways.  We discussed the difference between local and systemic therapy. In terms of loco-regional treatment, lumpectomy plus radiation is equivalent to mastectomy as far as survival is concerned. For this reason, and because the cosmetic results are generally superior, we recommend breast conserving surgery.   We also noted that in terms of  sequencing of treatments, whether systemic therapy or surgery is done first does not affect the ultimate outcome.  In her case we recommend chemotherapy first, chiefly because this will give her time to get genetically tested and if positive explore the multiple choices available for reconstruction.  Of course neoadjuvant chemotherapy will also give her prognostic information with regards to her response: Patient to obtain a complete pathologic response to generally do better long-term.  We will also facilitate her breast conserving surgery.  We then discussed the rationale for systemic therapy. There is some risk that this cancer may have already spread to other parts of her body. Patients frequently ask at this point about bone scans, CAT scans and PET scans to find out if they have occult breast cancer somewhere else. The problem is that in early stage disease we are much more likely to find false positives then true cancers and this would expose the patient to unnecessary procedures as well as unnecessary radiation. Scans cannot answer the question the patient really would like to know, which is whether she has microscopic disease elsewhere in her body. For those reasons we do not recommend them.  Of course we would proceed to aggressive evaluation of any symptoms that might suggest metastatic disease, but that is not the case here.  Next we went over the options for systemic therapy which are anti-estrogens, anti-HER-2 immunotherapy, and chemotherapy. Laya meets criteria for all 3.  This is very favorable and it means that she will have a very high chance of cure  More specifically we plan to start trastuzumab, Pertuzumab, carboplatin and docetaxel on 05/31/2018.  Today we discussed possible toxicities, side effects and complications including the very rare cases of permanent alopecia, damage to the heart muscle, and permanent neuropathy symptoms.  She will come to our "chemotherapy school" for more  information and further instruction on these and other side effects.  She qualifies for genetics testing. In patients who carry a deleterious mutation [for example in a  BRCA gene], the risk of a new breast cancer developing in the future may be sufficiently great that the patient may choose bilateral mastectomies. However if she wishes to keep her breasts in that situation it is safe to do so.  That would require intensified screening, which generally means not only yearly mammography but a yearly breast MRI as well. Of course, if there is a deleterious mutation bilateral oophorectomy would be necessary as there is no standard screening protocol for ovarian cancer.  She does have a history of HIV positivity.  This should not affect the choice of chemo or its effectiveness.  She has minimal to no neuropathy at present, but she does have diabetes and I have alerted her to let us know if any changes on the sensation in the pads of her fingers or the pads of her toes develop.  Finally she does have hidradenitis and this could be a complicating factor.  We may want to suppress this during the chemotherapy and I will ask her ID doctor to assist [place] in that regard.  Ellinore has a good understanding of the overall plan. She agrees with it. She knows the goal of treatment in her case is cure. She will call with any problems that may develop before her next visit here.   Senan Urey, Virgie Dad, MD  05/10/18 4:50 PM Medical Oncology and Hematology [facility] [address] [place], [place] [address] Tel. [phone]    Fax. [phone]  Alice Rieger, am acting as scribe for Chauncey Cruel MD.  I, Lurline Del MD, have reviewed the above documentation for accuracy and completeness, and I agree with the above.

## 2018-05-09 ENCOUNTER — Other Ambulatory Visit: Payer: Self-pay

## 2018-05-09 DIAGNOSIS — C50911 Malignant neoplasm of unspecified site of right female breast: Secondary | ICD-10-CM

## 2018-05-09 NOTE — Telephone Encounter (Signed)
Message left for patient to return my call.  

## 2018-05-10 ENCOUNTER — Ambulatory Visit
Admission: RE | Admit: 2018-05-10 | Discharge: 2018-05-10 | Disposition: A | Discharge status: Home / Self Care | Payer: Managed Care, Other (non HMO) | Source: Ambulatory Visit | Attending: Radiation Oncology | Admitting: Radiation Oncology

## 2018-05-10 ENCOUNTER — Inpatient Hospital Stay: Payer: Managed Care, Other (non HMO)

## 2018-05-10 ENCOUNTER — Other Ambulatory Visit: Payer: Self-pay

## 2018-05-10 ENCOUNTER — Inpatient Hospital Stay: Payer: Managed Care, Other (non HMO) | Attending: Oncology | Admitting: Oncology

## 2018-05-10 ENCOUNTER — Encounter: Payer: Self-pay | Admitting: Radiation Oncology

## 2018-05-10 VITALS — BP 163/98 | HR 72 | Temp 97.9°F | Resp 20 | Ht 65.0 in | Wt 246.8 lb

## 2018-05-10 VITALS — BP 145/90 | HR 69 | Temp 98.3°F | Resp 20 | Ht 65.0 in | Wt 246.8 lb

## 2018-05-10 DIAGNOSIS — L732 Hidradenitis suppurativa: Secondary | ICD-10-CM | POA: Insufficient documentation

## 2018-05-10 DIAGNOSIS — B20 Human immunodeficiency virus [HIV] disease: Secondary | ICD-10-CM | POA: Diagnosis not present

## 2018-05-10 DIAGNOSIS — C50911 Malignant neoplasm of unspecified site of right female breast: Secondary | ICD-10-CM

## 2018-05-10 DIAGNOSIS — C50311 Malignant neoplasm of lower-inner quadrant of right female breast: Secondary | ICD-10-CM | POA: Insufficient documentation

## 2018-05-10 DIAGNOSIS — Z85850 Personal history of malignant neoplasm of thyroid: Secondary | ICD-10-CM

## 2018-05-10 DIAGNOSIS — C73 Malignant neoplasm of thyroid gland: Secondary | ICD-10-CM | POA: Insufficient documentation

## 2018-05-10 DIAGNOSIS — C50211 Malignant neoplasm of upper-inner quadrant of right female breast: Secondary | ICD-10-CM | POA: Insufficient documentation

## 2018-05-10 DIAGNOSIS — Z170 Estrogen receptor positive status [ER+]: Secondary | ICD-10-CM | POA: Diagnosis not present

## 2018-05-10 DIAGNOSIS — E119 Type 2 diabetes mellitus without complications: Secondary | ICD-10-CM

## 2018-05-10 DIAGNOSIS — Z803 Family history of malignant neoplasm of breast: Secondary | ICD-10-CM

## 2018-05-10 DIAGNOSIS — Z801 Family history of malignant neoplasm of trachea, bronchus and lung: Secondary | ICD-10-CM | POA: Diagnosis not present

## 2018-05-10 DIAGNOSIS — Z87891 Personal history of nicotine dependence: Secondary | ICD-10-CM | POA: Insufficient documentation

## 2018-05-10 LAB — CMP (CANCER CENTER ONLY)
ALBUMIN: 3.8 g/dL (ref 3.5–5.0)
ALT: 23 U/L (ref 0–44)
AST: 28 U/L (ref 15–41)
Alkaline Phosphatase: 106 U/L (ref 38–126)
Anion gap: 11 (ref 5–15)
BUN: 19 mg/dL (ref 6–20)
CHLORIDE: 105 mmol/L (ref 98–111)
CO2: 28 mmol/L (ref 22–32)
CREATININE: 1.28 mg/dL — AB (ref 0.44–1.00)
Calcium: 9.6 mg/dL (ref 8.9–10.3)
GFR, Est AFR Am: 56 mL/min — ABNORMAL LOW (ref >60)
GFR, Estimated: 48 mL/min — ABNORMAL LOW (ref >60)
GLUCOSE: 74 mg/dL (ref 70–99)
POTASSIUM: 3.3 mmol/L — AB (ref 3.5–5.1)
Sodium: 144 mmol/L (ref 135–145)
TOTAL PROTEIN: 8.5 g/dL — AB (ref 6.5–8.1)

## 2018-05-10 LAB — CBC WITH DIFFERENTIAL (CANCER CENTER ONLY)
Basophils Absolute: 0 10*3/uL (ref 0.0–0.1)
Basophils Relative: 0 %
EOS ABS: 0.2 10*3/uL (ref 0.0–0.5)
EOS PCT: 2 %
HCT: 36.3 % (ref 34.8–46.6)
Hemoglobin: 11.8 g/dL (ref 11.6–15.9)
LYMPHS ABS: 3.4 10*3/uL — AB (ref 0.9–3.3)
Lymphocytes Relative: 42 %
MCH: 27.7 pg (ref 25.1–34.0)
MCHC: 32.5 g/dL (ref 31.5–36.0)
MCV: 85.2 fL (ref 79.5–101.0)
MONO ABS: 0.6 10*3/uL (ref 0.1–0.9)
Monocytes Relative: 8 %
Neutro Abs: 3.9 10*3/uL (ref 1.5–6.5)
Neutrophils Relative %: 48 %
Platelet Count: 334 10*3/uL (ref 145–400)
RBC: 4.26 MIL/uL (ref 3.70–5.45)
RDW: 17.6 % — ABNORMAL HIGH (ref 11.2–14.5)
WBC: 8.1 10*3/uL (ref 3.9–10.3)

## 2018-05-10 NOTE — Progress Notes (Signed)
Radiation Oncology         [phone] ________________________________  Name: [name]        MRN: [id number]  Date of Service: 05/10/2018 DOB: 23-Feb-1966  OM:[facility], Leticia Penna, NP  Coralie Keens, MD     REFERRING PHYSICIAN: Coralie Keens, MD   DIAGNOSIS: The primary encounter diagnosis was Invasive ductal carcinoma of right breast ([place]). A diagnosis of Thyroid cancer ([place]) was also pertinent to this visit.   HISTORY OF PRESENT ILLNESS: [name] is a 52 y.o. female seen in the multidisciplinary breast clinic for a new diagnosis of right breast cancer. The patient was noted to have a history of metastatic papillary thyroid cancer which was diagnosed in 2018.  She recently noted a palpable mass in her right breast for approximately 1 to 2 weeks, and had also had prior bilateral reduction.  Diagnostic imaging revealed a mass at 3:00 within the right breast measuring 2.9 x 2.5 x 1.1 cm.  She had palpable axillary adenopathy, and had 5 abnormal lymph nodes in the right axilla, and 3 in the left.  She underwent biopsy on 04/26/2018, her lymph nodes were negative for adenopathy related to cancer but more consistent with a hidradenitis appearance.  Her right breast however revealed a grade 2-3 invasive ductal carcinoma with calcifications, and was triple positive with HER-2 amplification of 4.52 her Ki-67 was 70%.  She comes today to discuss options of treatment for her cancer and is due to be seen by Dr. [name] today.  She is also scheduled for an MRI of the breast on 05/16/2018.  She is anticipating lumpectomy at this time.    PREVIOUS RADIATION THERAPY: No   PAST MEDICAL HISTORY:  Past Medical History:  Diagnosis Date  . Anemia    Normocytic  . Anxiety   . Asthma   . Blood dyscrasia   . Bronchitis 2005  . 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 Diabetes mellitus without complication ([place])   . Gastroenteritis 07/10/07  . GERD 07/24/2006  . GRAVE'S DISEASE  01/01/2008  . History of hidradenitis suppurativa   . History of kidney stones   . History of thrush   . HIV DISEASE 07/24/2006   dx March 05  . HYPERTENSION 07/24/2006  . Hyperthyroidism 08/2006   Grave's Disease -diffuse radiotracer uptake 08/25/06 Thyroid scan-Cold nodule to R lower lobe of thyrorid  . Menometrorrhagia    hx of  . Nephrolithiasis   . 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[place])    METASTATIC PAPILLARY THYROID CARCINOMA/notes 04/12/2017  . Pneumonia 2005  . 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 Sarcoidosis 02/08/2007   dx as a teenager in [place] from abnl CXR. Completed 2 yrs Prednisone after lung bx confirmation. No symptoms since then.  . Thyroid cancer ([place])   . THYROID NODULE, RIGHT 02/08/2007       PAST SURGICAL HISTORY: Past Surgical History:  Procedure Laterality Date  . BREAST SURGERY  1997   Breast Reduction   . CYSTOSCOPY W/ URETERAL STENT REMOVAL  11/09/2012   Procedure: CYSTOSCOPY WITH STENT REMOVAL;  Surgeon: Alexis Frock, MD;  Location: [facility];  Service: Urology;  Laterality: Right;  . CYSTOSCOPY WITH RETROGRADE PYELOGRAM, URETEROSCOPY AND STENT PLACEMENT  11/09/2012   Procedure: CYSTOSCOPY WITH RETROGRADE PYELOGRAM, URETEROSCOPY AND STENT PLACEMENT;  Surgeon: Alexis Frock, MD;  Location: [facility];  Service: Urology;  Laterality: Left;  LEFT URETEROSCOPY, STONE MANIPULATION, left STENT exchange   . CYSTOSCOPY WITH STENT PLACEMENT  10/02/2012   Procedure: CYSTOSCOPY WITH  STENT PLACEMENT;  Surgeon: Alexis Frock, MD;  Location: [facility];  Service: Urology;  Laterality: Left;  . DILATION AND CURETTAGE OF UTERUS  Feb 2004   s/p for 1st trimester nonviable pregnancy  . EYE SURGERY     sty under eyelid  . INCISE AND DRAIN ABCESS  Nov 03   s/p I &D for righ inframmary fold hidradenitis  . INCISION AND DRAINAGE PERITONSILLAR ABSCESS  Mar 03  . IRRIGATION AND DEBRIDEMENT ABSCESS  01/31/2012   Procedure: IRRIGATION AND DEBRIDEMENT ABSCESS;  Surgeon: Shann Medal, MD;  Location: [facility];  Service: General;  Laterality: Right;  right breast and axilla   . NEPHROLITHOTOMY  10/02/2012   Procedure: NEPHROLITHOTOMY PERCUTANEOUS;  Surgeon: Alexis Frock, MD;  Location: [facility];  Service: Urology;  Laterality: Right;  First Stage Percutaneous Nephrolithotomy with Surgeon Access, Left Ureteral Stent    . NEPHROLITHOTOMY  10/04/2012   Procedure: NEPHROLITHOTOMY PERCUTANEOUS SECOND LOOK;  Surgeon: Alexis Frock, MD;  [facility];  Service: Urology;  Laterality: Right;     . NEPHROLITHOTOMY  10/08/2012   Procedure: NEPHROLITHOTOMY PERCUTANEOUS;  Surgeon: Alexis Frock, MD;  Location: [facility];  Service: Urology;  Laterality: Right;  THIRD STAGE, nephrostomy tube exchange x 2  . NEPHROLITHOTOMY  10/11/2012   Procedure: NEPHROLITHOTOMY PERCUTANEOUS SECOND LOOK;  Surgeon: Alexis Frock, MD;  Location: [facility];  Service: Urology;  Laterality: Right;  RIGHT 4 STAGE PERCUTANOUS NEPHROLITHOTOMY, right URETEROSCOPY WITH HOLMIUM LASER   . RADICAL NECK DISSECTION  04/12/2017   limited/notes 04/12/2017  . RADICAL NECK DISSECTION N/A 04/12/2017   Procedure: RADICAL NECK DISSECTION;  Surgeon: Melida Quitter, MD;  Location: [facility];  Service: ENT;  Laterality: N/A;  limited neck dissection 2 hours total  . REDUCTION MAMMAPLASTY Bilateral 1998  . [place]  1994  . THYROIDECTOMY  04/12/2017   completion/notes 04/12/2017  . THYROIDECTOMY N/A 04/12/2017   Procedure: THYROIDECTOMY;  Surgeon: Melida Quitter, MD;  Location: [facility];  Service: ENT;  Laterality: N/A;  Completion Thyroidectomy  . TOTAL THYROIDECTOMY  2010     FAMILY HISTORY:  Family History  Problem Relation Age of Onset  . Hypertension Mother   . Cancer Mother        throat  . Heart disease Mother        stent  . Hypertension Father   . Cancer Other        FH of Breast Cancer  . Cancer Other   . Heart disease Other   . Hypertension Other   . Stroke Other        Grandparent  . Kidney disease Other         Grandparent  . Diabetes Other        FH of Diabetes  . Hypertension Sister   . Cancer Maternal Uncle        Stomach Cancer, Lung CA  . Hypertension Brother   . Hypertension Sister   . Breast cancer Maternal Aunt   . Breast cancer Paternal Aunt 51     SOCIAL HISTORY:  reports that she has quit smoking. Her smoking use included cigarettes. She started smoking about 12 months ago. She has a 7.50 pack-year smoking history. She has never used smokeless tobacco. She reports that she does not drink alcohol or use drugs.   ALLERGIES: Tape; Genvoya [elviteg-cobic-emtricit-tenofaf]; Lisinopril; Xylocaine [lidocaine]; and Valsartan   MEDICATIONS:  Current Outpatient Medications  Medication Sig Dispense Refill  . amLODipine (NORVASC) 10 MG tablet Take 1 tablet (  10 mg total) by mouth daily. 90 tablet 0  . atorvastatin (LIPITOR) 20 MG tablet Take 1 tablet by mouth every evening for high cholesterol. NEED APPOINTMENT FOR ANY MORE REFILLS 30 tablet 0  . glucose blood (ONETOUCH VERIO) test strip Use as instructed to test blood sugar daily 100 each 5  . hydrALAZINE (APRESOLINE) 25 MG tablet TAKE 1 TABLET BY MOUTH THREE TIMES A DAY 270 tablet 1  . irbesartan-hydrochlorothiazide (AVALIDE) 150-12.5 MG tablet TAKE 1 TABLET BY MOUTH ONCE DAILY FOR BLOOD PRESSURE. 90 tablet 1  . levothyroxine (SYNTHROID, LEVOTHROID) 175 MCG tablet TAKE 1 TABLET BY MOUTH EVERY MORNING ON AN EMPTY STOMACH WITH A FULL GLASS OF WATER. 90 tablet 0  . metFORMIN (GLUCOPHAGE) 500 MG tablet TAKE 1 TABLET BY MOUTH TWICE A DAY WITH A MEAL (DUE FOR APPT FOR ADDITIONAL REFILLS) 60 tablet 0  . metoprolol succinate (TOPROL-XL) 50 MG 24 hr tablet TAKE 1 TABLET (50 MG TOTAL) BY MOUTH DAILY. TAKE WITH OR IMMEDIATELY FOLLOWING A MEAL. 90 tablet 1  . mometasone (NASONEX) 50 MCG/ACT nasal spray PLACE 2 SPRAYS INTO THE NOSE DAILY. 17 g 3  . Multiple Vitamin (MULTIVITAMIN) tablet Take 1 tablet by mouth daily.    . ODEFSEY 200-25-25 MG TABS  tablet TAKE 1 TABLET BY MOUTH DAILY. 30 tablet 5  . ONE TOUCH LANCETS MISC Use as instructed to test blood sugar daily 200 each 5  . pantoprazole (PROTONIX) 40 MG tablet Take 40 mg by mouth daily as needed (for indigestion/acid reflux.).     Marland Kitchen PROAIR RESPICLICK 725 (90 Base) MCG/ACT AEPB INHALE 2 PUFFS INTO THE LUNGS EVERY 6 HOURS AS NEEDED FOR WHEEZING OR SHORTNESS OF BREATH. 1 each 2   No current facility-administered medications for this encounter.      REVIEW OF SYSTEMS: On review of systems, the patient reports that she is doing well overall. She denies any chest pain, shortness of breath, cough, fevers, chills, night sweats, unintended weight changes. She denies any bowel or bladder disturbances, and denies abdominal pain, nausea or vomiting. She denies any new musculoskeletal or joint aches or pains. A complete review of systems is obtained and is otherwise negative.     PHYSICAL EXAM:  Wt Readings from Last 3 Encounters:  05/10/18 246 lb 12.8 oz (111.9 kg)  05/10/18 246 lb 12.8 oz (111.9 kg)  12/29/17 248 lb 4 oz (112.6 kg)   Temp Readings from Last 3 Encounters:  05/10/18 98.3 F (36.8 C) (Oral)  05/10/18 97.9 F (36.6 C) (Oral)  12/29/17 98.2 F (36.8 C) (Oral)   BP Readings from Last 3 Encounters:  05/10/18 (!) 145/90  05/10/18 (!) 163/98  12/29/17 (!) 148/82   Pulse Readings from Last 3 Encounters:  05/10/18 69  05/10/18 72  12/29/17 66     In general this is a well appearing African American female in no acute distress. She is alert and oriented x4 and appropriate throughout the examination. HEENT reveals that the patient is normocephalic, atraumatic. EOMs are intact. Cardiopulmonary assessment is negative for acute distress and she exhibits normal effort. Breast exam is deferred.   ECOG = 1  0 - Asymptomatic (Fully active, able to carry on all predisease activities without restriction)  1 - Symptomatic but completely ambulatory (Restricted in physically  strenuous activity but ambulatory and able to carry out work of a light or sedentary nature. For example, light housework, office work)  2 - Symptomatic, <50% in bed during the day (Ambulatory and capable of  all self care but unable to carry out any work activities. Up and about more than 50% of waking hours)  3 - Symptomatic, >50% in bed, but not bedbound (Capable of only limited self-care, confined to bed or chair 50% or more of waking hours)  4 - Bedbound (Completely disabled. Cannot carry on any self-care. Totally confined to bed or chair)  5 - Death   Eustace Pen MM, Creech RH, Tormey DC, et al. [phone]). "Toxicity and response criteria of the [facility] [employer]". [place] Oncol. 5 (6): 649-55    LABORATORY DATA:  Lab Results  Component Value Date   WBC 8.1 05/10/2018   HGB 11.8 05/10/2018   HCT 36.3 05/10/2018   MCV 85.2 05/10/2018   PLT 334 05/10/2018   Lab Results  Component Value Date   NA 144 05/10/2018   K 3.3 (L) 05/10/2018   CL 105 05/10/2018   CO2 28 05/10/2018   Lab Results  Component Value Date   ALT 23 05/10/2018   AST 28 05/10/2018   ALKPHOS 106 05/10/2018   BILITOT <0.2 (L) 05/10/2018      RADIOGRAPHY: US Breast Ltd Uni Right Inc Axilla  Result Date: 04/20/2018 CLINICAL DATA:  Mass felt by the patient in the upper inner right breast for the past 1.5 weeks. Status post bilateral breast reduction in 1998. History of breast cancer in a maternal aunt and paternal aunt. The patient did not return for a recommended 6 month follow-up for a probably benign mass and probably benign calcifications on the right and probable reactive left axillary lymph nodes in 2016.Multiple skin boils in both breasts. EXAM: DIGITAL DIAGNOSTIC BILATERAL MAMMOGRAM WITH CAD AND TOMO ULTRASOUND RIGHT BREAST ULTRASOUND LEFT AXILLA COMPARISON:  Previous mammogram and ultrasound examinations in 2016. ACR Breast Density Category b: There are scattered areas of fibroglandular  density. FINDINGS: There is an irregular mass with some circumscribed and some indistinct margins in the medial right breast in the region of the mass felt by the patient, marked with a metallic marker. There are associated pleomorphic calcifications within the mass and immediately adjacent to the mass. There are bilateral enlarged axillary lymph nodes. The previously evaluated probably benign calcifications in the 6 o'clock position of the right breast are unchanged. On today's 3D images, these are shown to be within the skin. Additional bilateral benign dermal calcifications are also stable. No mammographic findings in the left breast suspicious for malignancy. Mammographic images were processed with CAD. On physical exam, the patient has multiple skin boils in various stages of development throughout both breasts and both axillae. There is an approximately 3 cm mildly irregular palpable mass in the 3 o'clock position of the right breast, 4 cm from the nipple. There are 2 palpable masses in each axilla. Due to the skin changes, it is difficult to determine if these are palpable nodes or areas of focal skin thickening/scarring from her multiple boils. Targeted ultrasound is performed, showing a 2.9 x 2.5 x 1.6 cm irregular, heterogeneous, predominantly hypoechoic mass containing calcifications in the 3 o'clock position of the right breast, 4 cm from the nipple. Ultrasound of the right axilla demonstrates 5 lymph nodes with abnormal cortical thickening. The 2 largest nodes are in the inferior aspect of the axilla, including the areas of palpable abnormality. Ultrasound of the left axilla demonstrates 3 lymph nodes with abnormal cortical thickening. The 2 largest are in the inferior axilla and include the areas of palpable abnormality. IMPRESSION: 1. 2.9 x 2.5 x  1.6 cm palpable mass with imaging features highly suspicious for malignancy in the 3 o'clock position of the right breast. 2. Multiple bilateral abnormal  appearing axillary lymph nodes. These could represent metastatic nodes and/or reactive nodes. RECOMMENDATION: 1. Ultrasound-guided core needle biopsy of the 2.9 cm mass in the 3 o'clock position of the right breast. 2. Ultrasound-guided core needle biopsy of one of the larger, more inferior right axillary lymph nodes. 3. Ultrasound-guided core needle biopsy of one of the larger, more inferior left axillary lymph nodes. The biopsy recommendations have been discussed with the patient and the biopsies have been scheduled at 3:45 p.m. on 04/26/2018. I have discussed the findings and recommendations with the patient. Results were also provided in writing at the conclusion of the visit. If applicable, a reminder letter will be sent to the patient regarding the next appointment. BI-RADS CATEGORY  5: Highly suggestive of malignancy. Electronically Signed   By: Claudie Revering M.D.   On: 04/20/2018 13:06   Mm Diag Breast Tomo Bilateral  Result Date: 04/20/2018 CLINICAL DATA:  Mass felt by the patient in the upper inner right breast for the past 1.5 weeks. Status post bilateral breast reduction in 1998. History of breast cancer in a maternal aunt and paternal aunt. The patient did not return for a recommended 6 month follow-up for a probably benign mass and probably benign calcifications on the right and probable reactive left axillary lymph nodes in 2016.Multiple skin boils in both breasts. EXAM: DIGITAL DIAGNOSTIC BILATERAL MAMMOGRAM WITH CAD AND TOMO ULTRASOUND RIGHT BREAST ULTRASOUND LEFT AXILLA COMPARISON:  Previous mammogram and ultrasound examinations in 2016. ACR Breast Density Category b: There are scattered areas of fibroglandular density. FINDINGS: There is an irregular mass with some circumscribed and some indistinct margins in the medial right breast in the region of the mass felt by the patient, marked with a metallic marker. There are associated pleomorphic calcifications within the mass and immediately  adjacent to the mass. There are bilateral enlarged axillary lymph nodes. The previously evaluated probably benign calcifications in the 6 o'clock position of the right breast are unchanged. On today's 3D images, these are shown to be within the skin. Additional bilateral benign dermal calcifications are also stable. No mammographic findings in the left breast suspicious for malignancy. Mammographic images were processed with CAD. On physical exam, the patient has multiple skin boils in various stages of development throughout both breasts and both axillae. There is an approximately 3 cm mildly irregular palpable mass in the 3 o'clock position of the right breast, 4 cm from the nipple. There are 2 palpable masses in each axilla. Due to the skin changes, it is difficult to determine if these are palpable nodes or areas of focal skin thickening/scarring from her multiple boils. Targeted ultrasound is performed, showing a 2.9 x 2.5 x 1.6 cm irregular, heterogeneous, predominantly hypoechoic mass containing calcifications in the 3 o'clock position of the right breast, 4 cm from the nipple. Ultrasound of the right axilla demonstrates 5 lymph nodes with abnormal cortical thickening. The 2 largest nodes are in the inferior aspect of the axilla, including the areas of palpable abnormality. Ultrasound of the left axilla demonstrates 3 lymph nodes with abnormal cortical thickening. The 2 largest are in the inferior axilla and include the areas of palpable abnormality. IMPRESSION: 1. 2.9 x 2.5 x 1.6 cm palpable mass with imaging features highly suspicious for malignancy in the 3 o'clock position of the right breast. 2. Multiple bilateral abnormal appearing axillary lymph nodes.  These could represent metastatic nodes and/or reactive nodes. RECOMMENDATION: 1. Ultrasound-guided core needle biopsy of the 2.9 cm mass in the 3 o'clock position of the right breast. 2. Ultrasound-guided core needle biopsy of one of the larger, more  inferior right axillary lymph nodes. 3. Ultrasound-guided core needle biopsy of one of the larger, more inferior left axillary lymph nodes. The biopsy recommendations have been discussed with the patient and the biopsies have been scheduled at 3:45 p.m. on 04/26/2018. I have discussed the findings and recommendations with the patient. Results were also provided in writing at the conclusion of the visit. If applicable, a reminder letter will be sent to the patient regarding the next appointment. BI-RADS CATEGORY  5: Highly suggestive of malignancy. Electronically Signed   By: Claudie Revering M.D.   On: 04/20/2018 13:06   [place] Axillary Node Core Biopsy Left  Addendum Date: 04/30/2018   ADDENDUM REPORT: 04/30/2018 08:29 ADDENDUM: Pathology revealed GRADE II - INVASIVE DUCTAL CARCINOMA WITH CALCIFICATIONS, INTERMEDIATE TO NUCLEAR GRADE - DUCTAL CARCINOMA IN SITU (DCIS) OF RIGHT BREAST, 3:30 o'clock. This was found to be concordant by Dr. Lajean Manes. Pathology revealed LYMPH NODE, NEGATIVE FOR CARCINOMA OF RIGHT AXILLA. This was found to be concordant by Dr. Lajean Manes. Pathology revealed LYMPH NODE, NEGATIVE FOR CARCINOMA of LEFT AXILLA. This was found to be concordant by Dr. Lajean Manes. Pathology results were discussed with the patient by telephone. The patient reported doing well after the biopsy with tenderness at the site. Post biopsy instructions and care were reviewed and questions were answered. The patient was encouraged to call [facility] for any additional concerns. Surgical consultation has been arranged with Dr. Coralie Keens at [facility] on April 30, 2018. Pathology results reported by Roselind Messier, RN on 04/30/2018. Electronically Signed   By: Lajean Manes M.D.   On: 04/30/2018 08:29   Result Date: 04/30/2018 CLINICAL DATA:  Patient presents for ultrasound-guided core needle biopsy a medial right breast mass located at the 3:30 o'clock position of the  right breast. She also will be undergoing ultrasound-guided core needle biopsy abnormal right and left axillary lymph nodes. EXAM: ULTRASOUND GUIDED RIGHT BREAST CORE NEEDLE BIOPSY ULTRASOUND GUIDED RIGHT AXILLARY LYMPH NODE CORE NEEDLE BIOPSY ULTRASOUND GUIDED LEFT AXILLARY LYMPH NODE CORE NEEDLE BIOPSY COMPARISON:  Previous exam(s). FINDINGS: I met with the patient and we discussed the procedure of ultrasound-guided biopsy, including benefits and alternatives. We discussed the high likelihood of a successful procedure. We discussed the risks of the procedure, including infection, bleeding, tissue injury, clip migration, and inadequate sampling. Informed written consent was given. The usual time-out protocol was performed immediately prior to the procedure. LESION #1 Lesion quadrant: Lower inner quadrant Using sterile technique and 1% Lidocaine as local anesthetic, under direct ultrasound visualization, a 12 gauge spring-loaded device was used to perform biopsy of the 3:30 o'clock position right breast mass using an inferior approach. At the conclusion of the procedure a ribbon shaped tissue marker clip was deployed into the biopsy cavity. Follow up 2 view mammogram was performed and dictated separately. LESION #2 Using sterile technique and 1% Lidocaine as local anesthetic, under direct ultrasound visualization, a 14 gauge spring-loaded device was used to perform biopsy of 1 of the abnormal right axillary lymph nodes using a lateral approach. At the conclusion of the procedure a HydroMARK tissue marker clip was deployed into the biopsy cavity. LESION #3 Using sterile technique and 1% Lidocaine as local anesthetic, under direct ultrasound visualization,  a 14 gauge spring-loaded device was used to perform biopsy of 1 of the abnormal left axillary lymph nodes using an inferior, lateral approach. At the conclusion of the procedure a HydroMARK tissue marker clip was deployed into the biopsy cavity. IMPRESSION:  Ultrasound guided biopsy of 3 lesions, a medial, 3:30 o'clock, right breast mass and bilateral abnormal axillary lymph nodes. No apparent complications. Electronically Signed: By: Lajean Manes M.D. 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 This was found to be concordant by Dr. Lajean Manes. Pathology revealed LYMPH NODE, NEGATIVE FOR CARCINOMA OF RIGHT AXILLA. This was found to be concordant by Dr. Lajean Manes. Pathology revealed LYMPH NODE, NEGATIVE FOR CARCINOMA of LEFT AXILLA. This was found to be concordant by Dr. Lajean Manes. Pathology results were discussed with the patient by telephone. The patient reported doing well after the biopsy with tenderness at the site. Post biopsy instructions and care were reviewed and questions were answered. The patient was encouraged to call [facility] for any additional concerns. Surgical consultation has been arranged with Dr. Coralie Keens at [facility] on April 30, 2018. Pathology results reported by Roselind Messier, RN on 04/30/2018. Electronically Signed   By: Lajean Manes M.D.   On: 04/30/2018 08:29   Result Date: 04/30/2018 CLINICAL DATA:  Patient presents for ultrasound-guided core needle biopsy a medial right breast mass located at the 3:30 o'clock position of the right breast. She also will be undergoing ultrasound-guided core needle biopsy abnormal right and left axillary lymph nodes. EXAM: ULTRASOUND GUIDED RIGHT BREAST CORE NEEDLE BIOPSY ULTRASOUND GUIDED RIGHT AXILLARY LYMPH NODE CORE NEEDLE BIOPSY ULTRASOUND GUIDED LEFT AXILLARY LYMPH NODE CORE NEEDLE BIOPSY COMPARISON:  Previous exam(s). FINDINGS: I met with the patient and we discussed the procedure of  ultrasound-guided biopsy, including benefits and alternatives. We discussed the high likelihood of a successful procedure. We discussed the risks of the procedure, including infection, bleeding, tissue injury, clip migration, and inadequate sampling. Informed written consent was given. The usual time-out protocol was performed immediately prior to the procedure. LESION #1 Lesion quadrant: Lower inner quadrant Using sterile technique and 1% Lidocaine as local anesthetic, under direct ultrasound visualization, a 12 gauge spring-loaded device was used to perform biopsy of the 3:30 o'clock position right breast mass using an inferior approach. At the conclusion of the procedure a ribbon shaped tissue marker clip was deployed into the biopsy cavity. Follow up 2 view mammogram was performed and dictated separately. LESION #2 Using sterile technique and 1% Lidocaine as local anesthetic, under direct ultrasound visualization, a 14 gauge spring-loaded device was used to perform biopsy of 1 of the abnormal right axillary lymph nodes using a lateral approach. At the conclusion of the procedure a HydroMARK tissue marker clip was deployed into the biopsy cavity. LESION #3 Using sterile technique and 1% Lidocaine as local anesthetic, under direct ultrasound visualization, a 14 gauge spring-loaded device was used to perform biopsy of 1 of the abnormal left axillary lymph nodes using an inferior, lateral approach. At the conclusion of the procedure a HydroMARK tissue marker clip was deployed into the biopsy cavity. IMPRESSION: Ultrasound guided biopsy of 3 lesions, a medial, 3:30 o'clock, right breast mass and bilateral abnormal axillary lymph nodes. No apparent complications. Electronically  Signed: By: Lajean Manes M.D. On: 04/26/2018 16:41   [place] Axilla Left  Result Date: 04/20/2018 CLINICAL DATA:  Mass felt by the patient in the upper inner right breast for the past 1.5 weeks. Status post bilateral breast reduction in 1998.  History of breast cancer in a maternal aunt and paternal aunt. The patient did not return for a recommended 6 month follow-up for a probably benign mass and probably benign calcifications on the right and probable reactive left axillary lymph nodes in 2016.Multiple skin boils in both breasts. EXAM: DIGITAL DIAGNOSTIC BILATERAL MAMMOGRAM WITH CAD AND TOMO ULTRASOUND RIGHT BREAST ULTRASOUND LEFT AXILLA COMPARISON:  Previous mammogram and ultrasound examinations in 2016. ACR Breast Density Category b: There are scattered areas of fibroglandular density. FINDINGS: There is an irregular mass with some circumscribed and some indistinct margins in the medial right breast in the region of the mass felt by the patient, marked with a metallic marker. There are associated pleomorphic calcifications within the mass and immediately adjacent to the mass. There are bilateral enlarged axillary lymph nodes. The previously evaluated probably benign calcifications in the 6 o'clock position of the right breast are unchanged. On today's 3D images, these are shown to be within the skin. Additional bilateral benign dermal calcifications are also stable. No mammographic findings in the left breast suspicious for malignancy. Mammographic images were processed with CAD. On physical exam, the patient has multiple skin boils in various stages of development throughout both breasts and both axillae. There is an approximately 3 cm mildly irregular palpable mass in the 3 o'clock position of the right breast, 4 cm from the nipple. There are 2 palpable masses in each axilla. Due to the skin changes, it is difficult to determine if these are palpable nodes or areas of focal skin thickening/scarring from her multiple boils. Targeted ultrasound is performed, showing a 2.9 x 2.5 x 1.6 cm irregular, heterogeneous, predominantly hypoechoic mass containing calcifications in the 3 o'clock position of the right breast, 4 cm from the nipple. Ultrasound of  the right axilla demonstrates 5 lymph nodes with abnormal cortical thickening. The 2 largest nodes are in the inferior aspect of the axilla, including the areas of palpable abnormality. Ultrasound of the left axilla demonstrates 3 lymph nodes with abnormal cortical thickening. The 2 largest are in the inferior axilla and include the areas of palpable abnormality. IMPRESSION: 1. 2.9 x 2.5 x 1.6 cm palpable mass with imaging features highly suspicious for malignancy in the 3 o'clock position of the right breast. 2. Multiple bilateral abnormal appearing axillary lymph nodes. These could represent metastatic nodes and/or reactive nodes. RECOMMENDATION: 1. Ultrasound-guided core needle biopsy of the 2.9 cm mass in the 3 o'clock position of the right breast. 2. Ultrasound-guided core needle biopsy of one of the larger, more inferior right axillary lymph nodes. 3. Ultrasound-guided core needle biopsy of one of the larger, more inferior left axillary lymph nodes. The biopsy recommendations have been discussed with the patient and the biopsies have been scheduled at 3:45 p.m. on 04/26/2018. I have discussed the findings and recommendations with the patient. Results were also provided in writing at the conclusion of the visit. If applicable, a reminder letter will be sent to the patient regarding the next appointment. BI-RADS CATEGORY  5: Highly suggestive of malignancy. Electronically Signed   By: Claudie Revering M.D.   On: 04/20/2018 13:06   Mm Clip Placement Right  Result Date: 04/26/2018 CLINICAL DATA:  Status post ultrasound-guided core needle biopsy of a  right breast mass and an abnormal right axillary lymph node. Patient also had a left axillary lymph node biopsied, the HydroMARK clip deploying in the node well seen on ultrasound. EXAM: DIAGNOSTIC RIGHT MAMMOGRAM POST ULTRASOUND BIOPSY COMPARISON:  Previous exam(s). FINDINGS: Mammographic images were obtained following ultrasound guided biopsy of a medial right breast  mass and an abnormal right axillary lymph node. The ribbon shaped biopsy clip lies within the medial right breast mass. The spiral shaped HydroMARK clip lies within the largest abnormal axillary lymph node. IMPRESSION: Well positioned post biopsy clips following ultrasound-guided core needle biopsy of a right breast mass and right axillary lymph node. Final Assessment: Post Procedure Mammograms for Marker Placement Electronically Signed   By: Lajean Manes M.D.   On: 04/26/2018 16:48   [place] Rt Breast Bx W Loc Dev 1st Lesion Img Bx Spec US Guide  Addendum Date: 04/30/2018   ADDENDUM REPORT: 04/30/2018 08:29 ADDENDUM: Pathology revealed GRADE II - INVASIVE DUCTAL CARCINOMA WITH CALCIFICATIONS, INTERMEDIATE TO NUCLEAR GRADE - DUCTAL CARCINOMA IN SITU (DCIS) OF RIGHT BREAST, 3:30 o'clock. This was found to be concordant by Dr. Lajean Manes. Pathology revealed LYMPH NODE, NEGATIVE FOR CARCINOMA OF RIGHT AXILLA. This was found to be concordant by Dr. Lajean Manes. Pathology revealed LYMPH NODE, NEGATIVE FOR CARCINOMA of LEFT AXILLA. This was found to be concordant by Dr. Lajean Manes. Pathology results were discussed with the patient by telephone. The patient reported doing well after the biopsy with tenderness at the site. Post biopsy instructions and care were reviewed and questions were answered. The patient was encouraged to call [facility] for any additional concerns. Surgical consultation has been arranged with Dr. Coralie Keens at [facility] on April 30, 2018. Pathology results reported by Roselind Messier, RN on 04/30/2018. Electronically Signed   By: Lajean Manes M.D.   On: 04/30/2018 08:29   Result Date: 04/30/2018 CLINICAL DATA:  Patient presents for ultrasound-guided core needle biopsy a medial right breast mass located at the 3:30 o'clock position of the right breast. She also will be undergoing ultrasound-guided core needle biopsy abnormal right and left axillary  lymph nodes. EXAM: ULTRASOUND GUIDED RIGHT BREAST CORE NEEDLE BIOPSY ULTRASOUND GUIDED RIGHT AXILLARY LYMPH NODE CORE NEEDLE BIOPSY ULTRASOUND GUIDED LEFT AXILLARY LYMPH NODE CORE NEEDLE BIOPSY COMPARISON:  Previous exam(s). FINDINGS: I met with the patient and we discussed the procedure of ultrasound-guided biopsy, including benefits and alternatives. We discussed the high likelihood of a successful procedure. We discussed the risks of the procedure, including infection, bleeding, tissue injury, clip migration, and inadequate sampling. Informed written consent was given. The usual time-out protocol was performed immediately prior to the procedure. LESION #1 Lesion quadrant: Lower inner quadrant Using sterile technique and 1% Lidocaine as local anesthetic, under direct ultrasound visualization, a 12 gauge spring-loaded device was used to perform biopsy of the 3:30 o'clock position right breast mass using an inferior approach. At the conclusion of the procedure a ribbon shaped tissue marker clip was deployed into the biopsy cavity. Follow up 2 view mammogram was performed and dictated separately. LESION #2 Using sterile technique and 1% Lidocaine as local anesthetic, under direct ultrasound visualization, a 14 gauge spring-loaded device was used to perform biopsy of 1 of the abnormal right axillary lymph nodes using a lateral approach. At the conclusion of the procedure a HydroMARK tissue marker clip was deployed into the biopsy cavity. LESION #3 Using sterile technique and 1% Lidocaine as local anesthetic, under direct ultrasound visualization,  a 14 gauge spring-loaded device was used to perform biopsy of 1 of the abnormal left axillary lymph nodes using an inferior, lateral approach. At the conclusion of the procedure a HydroMARK tissue marker clip was deployed into the biopsy cavity. IMPRESSION: Ultrasound guided biopsy of 3 lesions, a medial, 3:30 o'clock, right breast mass and bilateral abnormal axillary lymph  nodes. No apparent complications. Electronically Signed: By: Lajean Manes M.D. On: 04/26/2018 16:41       IMPRESSION/PLAN: 1. 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 Dr. Lisbeth Renshaw discusses the pathology findings and reviews the nature of triple positive breast disease. The consensus from the breast conference includes MRI for extent of disease. She will meet with Dr. [name] today to discuss the timing of chemotherapy either neoadjuvant or following breast conservation with lumpectomy with and sentinel node biopsy. Following surgery and chemotherapy, the patient's course would then be followed by external radiotherapy to the breast followed by antiestrogen therapy. We discussed the risks, benefits, short, and long term effects of radiotherapy, and the patient is interested in proceeding. Dr. Lisbeth Renshaw discusses the delivery and logistics of radiotherapy and anticipates a course of 6 1/2 weeks of radiotherapy. We will see her back about 2 weeks after completion of her surgical and chemotherapy courses to discuss the simulation process.   In a visit lasting 60 minutes, greater than 50% of the time was spent face to face discussing her case, and coordinating the patient's care.  The above documentation reflects my direct findings during this shared patient visit. Please see the separate note by Dr. Lisbeth Renshaw on this date for the remainder of the patient's plan of care.    Carola Rhine, PAC

## 2018-05-11 ENCOUNTER — Telehealth: Payer: Self-pay | Admitting: Oncology

## 2018-05-11 ENCOUNTER — Telehealth: Payer: Self-pay | Admitting: *Deleted

## 2018-05-11 ENCOUNTER — Encounter: Payer: Self-pay | Admitting: General Practice

## 2018-05-11 ENCOUNTER — Other Ambulatory Visit: Payer: Self-pay | Admitting: *Deleted

## 2018-05-11 ENCOUNTER — Other Ambulatory Visit: Payer: Self-pay | Admitting: Surgery

## 2018-05-11 ENCOUNTER — Telehealth: Payer: Self-pay

## 2018-05-11 NOTE — Telephone Encounter (Signed)
LVM for patient to call back for lab results and NP recommendations.

## 2018-05-11 NOTE — Telephone Encounter (Signed)
Called pt with new breast MRI date of 05/22/18 at 5:30 for arrival. Confirmed appt date and time.

## 2018-05-11 NOTE — Telephone Encounter (Signed)
-----   Message from Gardenia Phlegm, NP sent at 05/11/2018  2:05 PM EDT ----- Is patient taking potassium.  Her potassium is 3.3.  She also appears to be slightly dehydrated.  Recommend she increase fluid and potassium rich foods.  ----- Message ----- From: Buel Ream, Lab In [place] Sent: 05/10/2018   3:12 PM To: Chauncey Cruel, MD

## 2018-05-11 NOTE — Progress Notes (Signed)
[facility] Psychosocial Distress Screening Clinical Social Work  Clinical Social Work was referred by distress screening protocol.  The patient scored a 8 on the Psychosocial Distress Thermometer which indicates moderate distress. Clinical Social Worker contacted patient by phone to assess for distress and other psychosocial needs. Unable to talk at this time, will call back.    ONCBCN DISTRESS SCREENING 05/10/2018  Screening Type Initial Screening  Distress experienced in past week (1-10) 8  Emotional problem type Nervousness/Anxiety;Adjusting to illness  Physical Problem type Sleep/insomnia    Clinical Social Worker follow up needed: Yes.    If yes, follow up plan:  Beverely Pace, [place], LCSW Clinical Social Worker Phone:  [phone]

## 2018-05-11 NOTE — Telephone Encounter (Signed)
Called pt to provide navigation resources and contact information. Denies questions or concerns regarding dx or treatment care plan at this time. Encourage pt to call with needs. Received verbal understanding.

## 2018-05-11 NOTE — Telephone Encounter (Signed)
No 7/25 los.

## 2018-05-11 NOTE — Telephone Encounter (Signed)
Left message for pt to call back.  Need to relay [employer].

## 2018-05-11 NOTE — Telephone Encounter (Signed)
  Oncology Nurse Navigator Documentation  Oncology Nurse Navigator Flowsheets 05/11/2018  Navigator Location [facility]  Referral date to RadOnc/MedOnc 05/01/2018  Navigator Encounter Type Introductory phone call  Abnormal Finding Date 04/20/2018  Confirmed Diagnosis Date 04/26/2018  Patient Visit Type MedOnc;Initial  Treatment Phase Pre-Tx/Tx Discussion  Barriers/Navigation Needs Coordination of Care  Interventions Referrals;Coordination of Care  Referrals Genetics  Acuity Level 2  Time Spent with Patient 30         )

## 2018-05-14 ENCOUNTER — Inpatient Hospital Stay: Payer: Managed Care, Other (non HMO)

## 2018-05-14 ENCOUNTER — Encounter: Payer: Self-pay | Admitting: *Deleted

## 2018-05-14 ENCOUNTER — Other Ambulatory Visit: Payer: Self-pay

## 2018-05-14 ENCOUNTER — Encounter (HOSPITAL_BASED_OUTPATIENT_CLINIC_OR_DEPARTMENT_OTHER): Payer: Self-pay | Admitting: *Deleted

## 2018-05-14 ENCOUNTER — Telehealth: Payer: Self-pay | Admitting: Oncology

## 2018-05-14 NOTE — Telephone Encounter (Signed)
Per 7/25 sch msg [facility]).  Called patient w/ Gen Couns appt and lab visit.

## 2018-05-15 ENCOUNTER — Telehealth: Payer: Self-pay | Admitting: General Practice

## 2018-05-15 NOTE — Telephone Encounter (Signed)
[facility] CSW Progress Note  Call to patient to discuss Distress Screen, unable to reach patient.  Left generic VM requesting call back if desired.  Edwyna Shell, LCSW Clinical Social Worker Phone:  [phone]

## 2018-05-16 ENCOUNTER — Encounter (HOSPITAL_COMMUNITY): Payer: Self-pay | Admitting: Oncology

## 2018-05-16 ENCOUNTER — Other Ambulatory Visit: Payer: Self-pay

## 2018-05-16 ENCOUNTER — Ambulatory Visit (HOSPITAL_COMMUNITY)
Admission: RE | Admit: 2018-05-16 | Discharge: 2018-05-16 | Disposition: A | Discharge status: Home / Self Care | Payer: Managed Care, Other (non HMO) | Source: Ambulatory Visit | Attending: Oncology | Admitting: Oncology

## 2018-05-16 DIAGNOSIS — Z170 Estrogen receptor positive status [ER+]: Secondary | ICD-10-CM

## 2018-05-16 DIAGNOSIS — E119 Type 2 diabetes mellitus without complications: Secondary | ICD-10-CM | POA: Diagnosis not present

## 2018-05-16 DIAGNOSIS — I340 Nonrheumatic mitral (valve) insufficiency: Secondary | ICD-10-CM | POA: Insufficient documentation

## 2018-05-16 DIAGNOSIS — D869 Sarcoidosis, unspecified: Secondary | ICD-10-CM | POA: Diagnosis not present

## 2018-05-16 DIAGNOSIS — I119 Hypertensive heart disease without heart failure: Secondary | ICD-10-CM | POA: Insufficient documentation

## 2018-05-16 DIAGNOSIS — C50311 Malignant neoplasm of lower-inner quadrant of right female breast: Secondary | ICD-10-CM | POA: Diagnosis not present

## 2018-05-16 DIAGNOSIS — E785 Hyperlipidemia, unspecified: Secondary | ICD-10-CM | POA: Insufficient documentation

## 2018-05-16 DIAGNOSIS — C73 Malignant neoplasm of thyroid gland: Secondary | ICD-10-CM | POA: Insufficient documentation

## 2018-05-16 DIAGNOSIS — K219 Gastro-esophageal reflux disease without esophagitis: Secondary | ICD-10-CM | POA: Insufficient documentation

## 2018-05-16 DIAGNOSIS — C50211 Malignant neoplasm of upper-inner quadrant of right female breast: Secondary | ICD-10-CM | POA: Diagnosis not present

## 2018-05-16 NOTE — H&P (Signed)
[name] Documented: 04/30/2018 2:18 PM Location: [facility] Patient #: [id number] DOB: 10/06/1966 Single / Language: [name] / Race: Black or African American Female   History of Present Illness (Shamecka Hocutt A. Ninfa Linden MD; 04/30/2018 3:06 PM) The patient is a 52 year old female who presents with breast cancer. This is a patient who I see for hidradenitis who unfortunately was recently diagnosed with a right breast cancer. She felt the mass herself. She is uncertain how long it lasted present. It is in the lower inner right breast near an area where she often has hidradenitis flareups. She has a family history of breast cancer and several cousins one of which may be genetically positive. Her cousins workup is still in progress. She has had no previous cancer in the breast. She has had bilateral breast reductions. She denies nipple discharge. She is otherwise without complaints.   Problem List/Past Medical Sharyn Lull R. Brooks, CMA; 04/30/2018 2:18 PM) ABSCESS OF AXILLA, LEFT (L02.412)  ABSCESS OF BREAST, RIGHT (N61.1)  HIDRADENITIS (L73.2)   Past Surgical History Sharyn Lull R. Brooks, CMA; 04/30/2018 2:18 PM) Breast Reconstruction  Bilateral. Foot Surgery  Right. Thyroid Surgery   Diagnostic Studies History Sharyn Lull R. Brooks, CMA; 04/30/2018 2:18 PM) Colonoscopy  never Pap Smear  never  Allergies Sharyn Lull R. Brooks, CMA; 04/30/2018 2:18 PM) Tape 1"X5yd *MEDICAL DEVICES AND SUPPLIES*  clear tape Genvoya *ANTIVIRALS*  Lisinopril *ANTIHYPERTENSIVES*  Xylocaine (Cardiac) *ANTIARRHYTHMICS*  Valsartan *ANTIHYPERTENSIVES*   Medication History (Michelle R. Brooks, CMA; 04/30/2018 2:19 PM) Atorvastatin Calcium (20MG  Tablet, Oral) Active. HydrALAZINE HCl (25MG  Tablet, Oral) Active. Levothyroxine Sodium (175MCG Tablet, Oral) Active. Levothyroxine Sodium (200MCG Tablet, Oral) Active. Losartan Potassium-HCTZ (100-25MG  Tablet, Oral) Active. MetFORMIN  HCl (500MG  Tablet, Oral) Active. Metoprolol Succinate ER (50MG  Tablet ER 24HR, Oral) Active. Mometasone Furoate (50MCG/ACT Suspension, Nasal) Active. Odefsey (200-25-25MG  Tablet, Oral) Active. OneTouch Delica Lancets 32G Active. OneTouch Verio (In Vitro) Active. ProAir RespiClick (401 (90 Base)MCG/ACT Aero Pow Br Act, Inhalation) Active. Medications Reconciled  Social History Sharyn Lull R. Brooks, CMA; 04/30/2018 2:18 PM) Alcohol use  Occasional alcohol use. No caffeine use  No drug use  Tobacco use  Current every day smoker.  Family History Sharyn Lull R. Rolena Infante, CMA; 04/30/2018 2:18 PM) Arthritis  Family Members In General. Breast Cancer  Family Members In [place]  Mother. Diabetes Mellitus  Family Members In General. Heart Disease  Family Members In General. Hypertension  Family Members In General, Father, Mother, Sister. Kidney Disease  Family Members In General. Thyroid problems  Family Members In [place], Son.  Pregnancy / Birth History Sharyn Lull R. Rolena Infante, CMA; 04/30/2018 2:18 PM) Age at menarche  69 years. Age of menopause  <47 Contraceptive History  Depo-provera. Gravida  1 Irregular periods  Maternal age  56-30 Para  1  Other Problems Sharyn Lull R. Brooks, CMA; 04/30/2018 2:18 PM) High blood pressure  HIV-positive  Kidney Stone  Thyroid Disease   Vitals Sharyn Lull R. Brooks CMA; 04/30/2018 2:18 PM) 04/30/2018 2:18 PM Weight: 248.13 lb Height: 65in Body Surface Area: 2.17 m Body Mass Index: 41.29 kg/m  Pulse: 84 (Regular)  BP: 132/84 (Sitting, Left Arm, Standard)       Physical Exam (Charli Liberatore A. Ninfa Linden MD; 04/30/2018 3:07 PM) General Mental Status-Alert. General Appearance-Consistent with stated age. Hydration-Well hydrated. Voice-Normal.  Head and Neck Head-normocephalic, atraumatic with no lesions or palpable masses. Trachea-midline. Thyroid Gland Characteristics - normal size and  consistency.  Eye Eyeball - Bilateral-Extraocular movements intact. Sclera/Conjunctiva - Bilateral-No scleral icterus.  Chest and Lung Exam  Chest and lung exam reveals -quiet, even and easy respiratory effort with no use of accessory muscles and on auscultation, normal breath sounds, no adventitious sounds and normal vocal resonance. Inspection Chest Wall - Normal. Back - normal.  Breast Breast - Left-Symmetric, Non Tender, No Biopsy scars, no Dimpling, No Inflammation, No Lumpectomy scars, No Mastectomy scars, No Peau d' Orange. Breast - Right-Symmetric, Non Tender, No Biopsy scars, no Dimpling, No Inflammation, No Lumpectomy scars, No Mastectomy scars, No Peau d' Orange. Note: There is a large mass which is almost 5 cm in the lower inner quadrant of the right breast. I believe some this is hematoma from the biopsy. It is mobile. There are hidradenitis skin changes along the inframammary ridge.  Cardiovascular Cardiovascular examination reveals -normal heart sounds, regular rate and rhythm with no murmurs and normal pedal pulses bilaterally.  Abdomen Inspection Inspection of the abdomen reveals - No Hernias. Skin - Scar - no surgical scars. Palpation/Percussion Palpation and Percussion of the abdomen reveal - Soft, Non Tender, No Rebound tenderness, No Rigidity (guarding) and No hepatosplenomegaly. Auscultation Auscultation of the abdomen reveals - Bowel sounds normal.  Neurologic - Did not examine.  Musculoskeletal - Did not examine.  Lymphatic Head & Neck  General Head & Neck Lymphatics: Bilateral - Description - Normal. Axillary  General Axillary Region: Bilateral - Description - Normal. Tenderness - Non Tender. Femoral & Inguinal - Did not examine.    Assessment & Plan (Eulice Rutledge A. Ninfa Linden MD; 04/30/2018 3:08 PM) BREAST CANCER, RIGHT (C50.911) Impression: This is a patient with a new diagnosis of a right breast cancer. There is both in situ and invasive  cancer present. She had 2 lymph nodes which were biopsied which were negative for malignancy. 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 Again, there is a family history of breast cancer as well. After long discussion, we will be presenting her case in the multidisciplinary breast cancer conference. We discussed breast conservation versus mastectomy. It is difficult to say given the size right now whether she needs neoadjuvant therapy if she is genetically negative for the best cosmetic results. I did give her a copy of the pathology report and I will call her back as soon as the conference is completed with the plan. Currently, we will refer her to medical and radiation oncology as well as for genetic testing.  Addendum:  She has been discussed in the multidisciplinary breast conference.  Neoadjuvant chemotherapy is recommended.  I will proceed today now with Port-A-Cath insertion.  I discussed procedure with her in detail.  We discussed risk of bleeding, infection, need for further procedures of the port malfunctions, pneumothorax, injury to other structures, etc.  She understands and agrees to proceed with surgery

## 2018-05-16 NOTE — Progress Notes (Signed)
Patient was seen and given her pre surgery drink of ensure as well as instructions to drink it by 1000 am on the DOS. She was also reminded what time to arrive at the surgery center, 1200hrs. Medications to take in the morning of surgery were also discussed and patient verbalized understanding.

## 2018-05-16 NOTE — Progress Notes (Signed)
Echocardiogram 2D Echocardiogram has been performed.  Laquasha Groome G Hyland Mollenkopf 05/16/2018, 10:23 AM

## 2018-05-17 ENCOUNTER — Encounter (HOSPITAL_BASED_OUTPATIENT_CLINIC_OR_DEPARTMENT_OTHER)
Admission: RE | Disposition: A | Discharge status: Home / Self Care | Payer: Self-pay | Source: Ambulatory Visit | Attending: Surgery

## 2018-05-17 ENCOUNTER — Ambulatory Visit (HOSPITAL_COMMUNITY): Payer: Managed Care, Other (non HMO)

## 2018-05-17 ENCOUNTER — Ambulatory Visit (HOSPITAL_BASED_OUTPATIENT_CLINIC_OR_DEPARTMENT_OTHER): Payer: Managed Care, Other (non HMO) | Admitting: Certified Registered"

## 2018-05-17 ENCOUNTER — Other Ambulatory Visit: Payer: Self-pay

## 2018-05-17 ENCOUNTER — Ambulatory Visit (HOSPITAL_BASED_OUTPATIENT_CLINIC_OR_DEPARTMENT_OTHER)
Admission: RE | Admit: 2018-05-17 | Discharge: 2018-05-17 | Disposition: A | Discharge status: Home / Self Care | Payer: Managed Care, Other (non HMO) | Source: Ambulatory Visit | Attending: Surgery | Admitting: Surgery

## 2018-05-17 ENCOUNTER — Encounter (HOSPITAL_BASED_OUTPATIENT_CLINIC_OR_DEPARTMENT_OTHER): Payer: Self-pay | Admitting: Certified Registered"

## 2018-05-17 DIAGNOSIS — E119 Type 2 diabetes mellitus without complications: Secondary | ICD-10-CM | POA: Insufficient documentation

## 2018-05-17 DIAGNOSIS — F329 Major depressive disorder, single episode, unspecified: Secondary | ICD-10-CM | POA: Diagnosis not present

## 2018-05-17 DIAGNOSIS — Z419 Encounter for procedure for purposes other than remedying health state, unspecified: Secondary | ICD-10-CM

## 2018-05-17 DIAGNOSIS — Z6841 Body Mass Index (BMI) 40.0 and over, adult: Secondary | ICD-10-CM | POA: Insufficient documentation

## 2018-05-17 DIAGNOSIS — Z79899 Other long term (current) drug therapy: Secondary | ICD-10-CM | POA: Insufficient documentation

## 2018-05-17 DIAGNOSIS — Z7984 Long term (current) use of oral hypoglycemic drugs: Secondary | ICD-10-CM | POA: Insufficient documentation

## 2018-05-17 DIAGNOSIS — I10 Essential (primary) hypertension: Secondary | ICD-10-CM | POA: Insufficient documentation

## 2018-05-17 DIAGNOSIS — E039 Hypothyroidism, unspecified: Secondary | ICD-10-CM | POA: Diagnosis not present

## 2018-05-17 DIAGNOSIS — F419 Anxiety disorder, unspecified: Secondary | ICD-10-CM | POA: Diagnosis not present

## 2018-05-17 DIAGNOSIS — J45909 Unspecified asthma, uncomplicated: Secondary | ICD-10-CM | POA: Insufficient documentation

## 2018-05-17 DIAGNOSIS — Z87891 Personal history of nicotine dependence: Secondary | ICD-10-CM | POA: Insufficient documentation

## 2018-05-17 DIAGNOSIS — C50911 Malignant neoplasm of unspecified site of right female breast: Secondary | ICD-10-CM | POA: Diagnosis not present

## 2018-05-17 DIAGNOSIS — Z95828 Presence of other vascular implants and grafts: Secondary | ICD-10-CM

## 2018-05-17 HISTORY — PX: PORTACATH PLACEMENT: SHX2246

## 2018-05-17 LAB — GLUCOSE, CAPILLARY
GLUCOSE-CAPILLARY: 73 mg/dL (ref 70–99)
Glucose-Capillary: 76 mg/dL (ref 70–99)

## 2018-05-17 SURGERY — INSERTION, TUNNELED CENTRAL VENOUS DEVICE, WITH PORT
Anesthesia: General | Site: Chest | Laterality: Left

## 2018-05-17 MED ORDER — SCOPOLAMINE 1 MG/3DAYS TD PT72: 1.0000 | MEDICATED_PATCH | Freq: Once | TRANSDERMAL | Status: DC | PRN

## 2018-05-17 MED ORDER — PROPOFOL 10 MG/ML IV BOLUS
INTRAVENOUS | Status: DC | PRN
  Administered 2018-05-17: 150 mg via INTRAVENOUS

## 2018-05-17 MED ORDER — CEFAZOLIN SODIUM-DEXTROSE 2-4 GM/100ML-% IV SOLN
2.0000 g | INTRAVENOUS | Status: AC
  Administered 2018-05-17: 2 g via INTRAVENOUS

## 2018-05-17 MED ORDER — BUPIVACAINE-EPINEPHRINE 0.5% -1:200000 IJ SOLN
INTRAMUSCULAR | Status: DC | PRN
  Administered 2018-05-17: 10 mL

## 2018-05-17 MED ORDER — HEPARIN (PORCINE) IN NACL 1000-0.9 UT/500ML-% IV SOLN
INTRAVENOUS | Status: AC
  Filled 2018-05-17: qty 500

## 2018-05-17 MED ORDER — HEPARIN SOD (PORK) LOCK FLUSH 100 UNIT/ML IV SOLN
INTRAVENOUS | Status: AC
  Filled 2018-05-17: qty 5

## 2018-05-17 MED ORDER — FENTANYL CITRATE (PF) 100 MCG/2ML IJ SOLN
INTRAMUSCULAR | Status: AC
  Filled 2018-05-17: qty 2

## 2018-05-17 MED ORDER — PROMETHAZINE HCL 25 MG/ML IJ SOLN: 6.2500 mg | INTRAMUSCULAR | Status: DC | PRN

## 2018-05-17 MED ORDER — LACTATED RINGERS IV SOLN
INTRAVENOUS | Status: DC
  Administered 2018-05-17: 12:00:00 via INTRAVENOUS

## 2018-05-17 MED ORDER — ACETAMINOPHEN 500 MG PO TABS
ORAL_TABLET | ORAL | Status: AC
  Filled 2018-05-17: qty 2

## 2018-05-17 MED ORDER — LIDOCAINE 2% (20 MG/ML) 5 ML SYRINGE
INTRAMUSCULAR | Status: DC | PRN
  Administered 2018-05-17: 80 mg via INTRAVENOUS

## 2018-05-17 MED ORDER — IOPAMIDOL (ISOVUE-300) INJECTION 61%
INTRAVENOUS | Status: AC
  Filled 2018-05-17: qty 50

## 2018-05-17 MED ORDER — FENTANYL CITRATE (PF) 100 MCG/2ML IJ SOLN: 50.0000 ug | INTRAMUSCULAR | Status: DC | PRN

## 2018-05-17 MED ORDER — MIDAZOLAM HCL 2 MG/2ML IJ SOLN
INTRAMUSCULAR | Status: AC
  Filled 2018-05-17: qty 2

## 2018-05-17 MED ORDER — FENTANYL CITRATE (PF) 100 MCG/2ML IJ SOLN: 25.0000 ug | INTRAMUSCULAR | Status: DC | PRN

## 2018-05-17 MED ORDER — CEFAZOLIN SODIUM-DEXTROSE 2-4 GM/100ML-% IV SOLN
INTRAVENOUS | Status: AC
  Filled 2018-05-17: qty 100

## 2018-05-17 MED ORDER — OXYCODONE HCL 5 MG PO TABS: 5.0000 mg | ORAL_TABLET | Freq: Four times a day (QID) | ORAL | 0 refills | Status: DC | PRN

## 2018-05-17 MED ORDER — MIDAZOLAM HCL 2 MG/2ML IJ SOLN: 1.0000 mg | INTRAMUSCULAR | Status: DC | PRN

## 2018-05-17 MED ORDER — GABAPENTIN 300 MG PO CAPS
300.0000 mg | ORAL_CAPSULE | ORAL | Status: AC
  Administered 2018-05-17: 300 mg via ORAL

## 2018-05-17 MED ORDER — ACETAMINOPHEN 500 MG PO TABS
1000.0000 mg | ORAL_TABLET | ORAL | Status: AC
  Administered 2018-05-17: 1000 mg via ORAL

## 2018-05-17 MED ORDER — CHLORHEXIDINE GLUCONATE CLOTH 2 % EX PADS: 6.0000 | MEDICATED_PAD | Freq: Once | CUTANEOUS | Status: DC

## 2018-05-17 MED ORDER — ONDANSETRON HCL 4 MG/2ML IJ SOLN
INTRAMUSCULAR | Status: DC | PRN
  Administered 2018-05-17: 4 mg via INTRAVENOUS

## 2018-05-17 MED ORDER — DEXAMETHASONE SODIUM PHOSPHATE 10 MG/ML IJ SOLN
INTRAMUSCULAR | Status: DC | PRN
  Administered 2018-05-17: 10 mg via INTRAVENOUS

## 2018-05-17 MED ORDER — MIDAZOLAM HCL 5 MG/5ML IJ SOLN
INTRAMUSCULAR | Status: DC | PRN
  Administered 2018-05-17: 2 mg via INTRAVENOUS

## 2018-05-17 MED ORDER — HEPARIN (PORCINE) IN NACL 2-0.9 UNITS/ML
INTRAMUSCULAR | Status: AC | PRN
  Administered 2018-05-17: 1 via INTRAVENOUS

## 2018-05-17 MED ORDER — HEPARIN SOD (PORK) LOCK FLUSH 100 UNIT/ML IV SOLN
INTRAVENOUS | Status: DC | PRN
  Administered 2018-05-17: 500 [IU] via INTRAVENOUS

## 2018-05-17 MED ORDER — GABAPENTIN 300 MG PO CAPS
ORAL_CAPSULE | ORAL | Status: AC
  Filled 2018-05-17: qty 1

## 2018-05-17 SURGICAL SUPPLY — 40 items
BAG DECANTER FOR FLEXI CONT (MISCELLANEOUS) ×3 IMPLANT
BLADE HEX COATED 2.75 (ELECTRODE) ×3 IMPLANT
BLADE SURG 15 STRL LF DISP TIS (BLADE) ×1 IMPLANT
BLADE SURG 15 STRL SS (BLADE) ×2
CANISTER SUCT 1200ML W/VALVE (MISCELLANEOUS) IMPLANT
CHLORAPREP W/TINT 26ML (MISCELLANEOUS) ×3 IMPLANT
COVER BACK TABLE 60X90IN (DRAPES) ×3 IMPLANT
COVER MAYO STAND STRL (DRAPES) ×3 IMPLANT
DECANTER SPIKE VIAL GLASS SM (MISCELLANEOUS) IMPLANT
DERMABOND ADVANCED (GAUZE/BANDAGES/DRESSINGS) ×4
DERMABOND ADVANCED .7 DNX12 (GAUZE/BANDAGES/DRESSINGS) ×2 IMPLANT
DRAPE C-ARM 42X72 X-RAY (DRAPES) ×3 IMPLANT
DRAPE LAPAROSCOPIC ABDOMINAL (DRAPES) ×3 IMPLANT
DRAPE UTILITY XL STRL (DRAPES) ×3 IMPLANT
ELECT REM PT RETURN 9FT ADLT (ELECTROSURGICAL) ×3
ELECTRODE REM PT RTRN 9FT ADLT (ELECTROSURGICAL) ×1 IMPLANT
GLOVE SURG SIGNA 7.5 PF LTX (GLOVE) ×3 IMPLANT
GLOVE SURG SS PI 7.5 STRL IVOR (GLOVE) ×3 IMPLANT
GOWN STRL REUS W/ TWL LRG LVL3 (GOWN DISPOSABLE) ×1 IMPLANT
GOWN STRL REUS W/ TWL XL LVL3 (GOWN DISPOSABLE) ×1 IMPLANT
GOWN STRL REUS W/TWL LRG LVL3 (GOWN DISPOSABLE) ×2
GOWN STRL REUS W/TWL XL LVL3 (GOWN DISPOSABLE) ×2
IV KIT MINILOC 20X1 SAFETY (NEEDLE) IMPLANT
KIT PORT POWER 8FR ISP CVUE (Port) ×3 IMPLANT
NEEDLE HYPO 25X1 1.5 SAFETY (NEEDLE) ×3 IMPLANT
PACK BASIN DAY SURGERY FS (CUSTOM PROCEDURE TRAY) ×3 IMPLANT
PENCIL BUTTON HOLSTER BLD 10FT (ELECTRODE) ×3 IMPLANT
SLEEVE SCD COMPRESS KNEE MED (MISCELLANEOUS) ×3 IMPLANT
SUT MNCRL AB 4-0 PS2 18 (SUTURE) ×3 IMPLANT
SUT PROLENE 2 0 SH DA (SUTURE) ×3 IMPLANT
SUT SILK 2 0 TIES 17X18 (SUTURE)
SUT SILK 2-0 18XBRD TIE BLK (SUTURE) IMPLANT
SUT VIC AB 3-0 SH 27 (SUTURE) ×2
SUT VIC AB 3-0 SH 27X BRD (SUTURE) ×1 IMPLANT
SYR CONTROL 10ML LL (SYRINGE) ×3 IMPLANT
TOWEL GREEN STERILE FF (TOWEL DISPOSABLE) ×3 IMPLANT
TOWEL OR NON WOVEN STRL DISP B (DISPOSABLE) ×3 IMPLANT
TUBE CONNECTING 20'X1/4 (TUBING)
TUBE CONNECTING 20X1/4 (TUBING) IMPLANT
YANKAUER SUCT BULB TIP NO VENT (SUCTIONS) IMPLANT

## 2018-05-17 NOTE — Anesthesia Preprocedure Evaluation (Signed)
Anesthesia Evaluation  Patient identified by MRN, date of birth, ID band Patient awake    Reviewed: Allergy & Precautions, NPO status , Patient's Chart, lab work & pertinent test results, reviewed documented beta blocker date and time   Airway Mallampati: II  TM Distance: >3 FB Neck ROM: Full    Dental  (+) Teeth Intact, Dental Advisory Given   Pulmonary asthma , former smoker,  Sarcoidosis    Pulmonary exam normal breath sounds clear to auscultation       Cardiovascular hypertension, Pt. on medications and Pt. on home beta blockers Normal cardiovascular exam Rhythm:Regular Rate:Normal     Neuro/Psych PSYCHIATRIC DISORDERS Anxiety Depression negative neurological ROS     GI/Hepatic Neg liver ROS, GERD  Medicated,  Endo/Other  diabetes, Type 2, Oral Hypoglycemic AgentsHypothyroidism Morbid obesity  Renal/GU negative Renal ROS     Musculoskeletal negative musculoskeletal ROS (+)   Abdominal   Peds  Hematology  (+) HIV,   Anesthesia Other Findings Day of surgery medications reviewed with the patient.  Reproductive/Obstetrics                            Anesthesia Physical Anesthesia Plan  ASA: III  Anesthesia Plan: General   Post-op Pain Management:    Induction: Intravenous  PONV Risk Score and Plan: 3 and Midazolam, Dexamethasone and Ondansetron  Airway Management Planned:   Additional Equipment:   Intra-op Plan:   Post-operative Plan: Extubation in OR  Informed Consent: I have reviewed the patients History and Physical, chart, labs and discussed the procedure including the risks, benefits and alternatives for the proposed anesthesia with the patient or authorized representative who has indicated his/her understanding and acceptance.   Dental advisory given  Plan Discussed with: CRNA  Anesthesia Plan Comments:         Anesthesia Quick Evaluation

## 2018-05-17 NOTE — Interval H&P Note (Signed)
History and Physical Interval Note:no change in H and P  05/17/2018 10:57 AM  [name]  has presented today for surgery, with the diagnosis of BREAST CANCER  The various methods of treatment have been discussed with the patient and family. After consideration of risks, benefits and other options for treatment, the patient has consented to  Procedure(s): INSERTION PORT-A-CATH (N/A) as a surgical intervention .  The patient's history has been reviewed, patient examined, no change in status, stable for surgery.  I have reviewed the patient's chart and labs.  Questions were answered to the patient's satisfaction.     [name] A

## 2018-05-17 NOTE — Transfer of Care (Signed)
Immediate Anesthesia Transfer of Care Note  Patient: [name]  Procedure(s) Performed: INSERTION PORT-A-CATH (Left Chest)  Patient Location: PACU  Anesthesia Type:General  Level of Consciousness: awake, alert  and oriented  Airway & Oxygen Therapy: Patient Spontanous Breathing and Patient connected to face mask oxygen  Post-op Assessment: Report given to RN and Post -op Vital signs reviewed and stable  Post vital signs: Reviewed and stable  Last Vitals:  Vitals Value Taken Time  BP 159/96 05/17/2018  1:00 PM  Temp    Pulse 71 05/17/2018  1:06 PM  Resp 19 05/17/2018  1:06 PM  SpO2 98 % 05/17/2018  1:06 PM  Vitals shown include unvalidated device data.  Last Pain:  Vitals:   05/17/18 1109  TempSrc: Oral  PainSc: 0-No pain         Complications: No apparent anesthesia complications

## 2018-05-17 NOTE — Discharge Instructions (Signed)
° °  Ok to shower starting tomorrow  Ice pack, tylenol, and ibuprofen also for pain       To whom it may concern:  Please excuse [name] from work for the next 2 days to recover from surgery. She may return after the 2 days to work without limits. Thank you  Sincerely,  Dr. Nathaneil Canary a [facility] surgery            Post Anesthesia Home Care Instructions  Activity: Get plenty of rest for the remainder of the day. A responsible individual must stay with you for 24 hours following the procedure.  For the next 24 hours, DO NOT: -Drive a car -Paediatric nurse -Drink alcoholic beverages -Take any medication unless instructed by your physician -Make any legal decisions or sign important papers.  Meals: Start with liquid foods such as gelatin or soup. Progress to regular foods as tolerated. Avoid greasy, spicy, heavy foods. If nausea and/or vomiting occur, drink only clear liquids until the nausea and/or vomiting subsides. Call your physician if vomiting continues.  Special Instructions/Symptoms: Your throat may feel dry or sore from the anesthesia or the breathing tube placed in your throat during surgery. If this causes discomfort, gargle with warm salt water. The discomfort should disappear within 24 hours.  If you had a scopolamine patch placed behind your ear for the management of post- operative nausea and/or vomiting:  1. The medication in the patch is effective for 72 hours, after which it should be removed.  Wrap patch in a tissue and discard in the trash. Wash hands thoroughly with soap and water. 2. You may remove the patch earlier than 72 hours if you experience unpleasant side effects which may include dry mouth, dizziness or visual disturbances. 3. Avoid touching the patch. Wash your hands with soap and water after contact with the patch.

## 2018-05-17 NOTE — Anesthesia Procedure Notes (Signed)
Procedure Name: LMA Insertion Date/Time: 05/17/2018 12:17 PM Performed by: Gwyndolyn Saxon, CRNA Pre-anesthesia Checklist: Patient identified, Emergency Drugs available, Suction available, Patient being monitored and Timeout performed Patient Re-evaluated:Patient Re-evaluated prior to induction Oxygen Delivery Method: Circle system utilized Preoxygenation: Pre-oxygenation with 100% oxygen Induction Type: IV induction Ventilation: Mask ventilation without difficulty LMA: LMA with gastric port inserted LMA Size: 4.0 Number of attempts: 1 Placement Confirmation: positive ETCO2,  CO2 detector and breath sounds checked- equal and bilateral Tube secured with: Tape Dental Injury: Teeth and Oropharynx as per pre-operative assessment

## 2018-05-17 NOTE — Op Note (Signed)
INSERTION PORT-A-CATH  Procedure Note  [name] 05/17/2018   Pre-op Diagnosis: BREAST CANCER     Post-op Diagnosis: same  Procedure(s): INSERTION PORT-A-CATH (8 Fr [place])  Surgeon(s): Coralie Keens, MD  Anesthesia: General  Staff:  Circulator: Lynelle Doctor, RN Radiology Technologist: Marylene Land Scrub Person: Jolene Schimke, RN  Estimated Blood Loss: Minimal               Indications: This is a 52 year old female with a recent diagnosis of right breast cancer who will be undergoing neoadjuvant chemotherapy  Procedure: The patient was brought to the operating room and identified as the correct patient.  She was placed supine on the operating room table and general anesthesia was induced.  Her left neck and chest were prepped and draped in usual sterile fashion.  I anesthetized the skin the left chest with Marcaine.  I then used the introducer needle with the patient in the Trendelenburg position and easily cannulated the left subclavian vein.  I passed the wire through the needle and into the central venous system.  The needle was removed.  Fluoroscopy confirmed placement in the central venous system.  I anesthetized the skin further at the site of the wire.  I then made an incision with a scalpel.  I then created a pocket for the port.  An 8 [place] Port-A-Cath was brought to the field and flushed appropriately.  The port fit easily into the pocket.  I fed the venous introducer sheath and dilator over the wire and into the central venous system.  I then removed the dilator and wire.  I attached the catheter to the port.  I cut an appropriate length.  I then placed the port into the pocket and fed the catheter down the peel-away sheath.  The sheath was then peeled away completely removed leaving the catheter in the central venous system.  Placement of central venous system was confirmed with fluoroscopy.  I accessed the port and good flush and return were  demonstrated.  I again accessed it and instilled it with concentrated heparin solution.  I sutured the port in place with 2 separate 3-0 Prolene sutures.  I then closed the subcutaneous tissue with interrupted 3-0 Vicryl sutures and closed the skin with a running 4-0 Monocryl.  Dermabond was then applied.  The patient tolerated the procedure well.  All the counts were correct at the end of the procedure.  The patient was then extubated in the operating room and taken in a stable condition to the recovery room.          [name]   Date: 05/17/2018  Time: 12:52 PM

## 2018-05-18 ENCOUNTER — Encounter (HOSPITAL_BASED_OUTPATIENT_CLINIC_OR_DEPARTMENT_OTHER): Payer: Self-pay | Admitting: Surgery

## 2018-05-18 NOTE — Anesthesia Postprocedure Evaluation (Signed)
Anesthesia Post Note  Patient: [name]  Procedure(s) Performed: INSERTION PORT-A-CATH (Left Chest)     Patient location during evaluation: PACU Anesthesia Type: General Level of consciousness: awake and alert Pain management: pain level controlled Vital Signs Assessment: post-procedure vital signs reviewed and stable Respiratory status: spontaneous breathing, nonlabored ventilation and respiratory function stable Cardiovascular status: blood pressure returned to baseline and stable Postop Assessment: no apparent nausea or vomiting Anesthetic complications: no    Last Vitals:  Vitals:   05/17/18 1400 05/17/18 1447  BP: (!) 149/97 (!) 160/91  Pulse: 61 62  Resp: 15 16  Temp:  36.8 C  SpO2: 97% 98%    Last Pain:  Vitals:   05/17/18 1447  TempSrc:   PainSc: 0-No pain   Pain Goal:                 Catalina Gravel

## 2018-05-20 ENCOUNTER — Other Ambulatory Visit: Payer: Self-pay | Admitting: Oncology

## 2018-05-21 NOTE — Progress Notes (Signed)
The patient did not show today.  She is being scheduled to receive her premed instructions on the day of her first treatment.  We did instruct her to take dexamethasone 8 mg twice daily on the day before therapy.  All her prescriptions have been entered.

## 2018-05-21 NOTE — Progress Notes (Signed)
START ON PATHWAY REGIMEN - Breast     A cycle is every 21 days:     Pertuzumab      Pertuzumab      Trastuzumab      Trastuzumab      Carboplatin      Docetaxel   **Always confirm dose/schedule in your pharmacy ordering system**    Patient Characteristics: Preoperative or Nonsurgical Candidate (Clinical Staging), Neoadjuvant Therapy followed by Surgery, Invasive Disease, Chemotherapy, HER2 Positive, ER Positive Therapeutic Status: Preoperative or Nonsurgical Candidate (Clinical Staging) AJCC M Category: cM0 AJCC Grade: G2 Breast Surgical Plan: Neoadjuvant Therapy followed by Surgery ER Status: Positive (+) AJCC 8 Stage Grouping: IB HER2 Status: Positive (+) AJCC T Category: cT2 AJCC N Category: cN0 PR Status: Positive (+) Intent of Therapy: Curative Intent, Discussed with Patient 

## 2018-05-22 ENCOUNTER — Ambulatory Visit
Admission: RE | Admit: 2018-05-22 | Discharge: 2018-05-22 | Disposition: A | Discharge status: Home / Self Care | Payer: Managed Care, Other (non HMO) | Source: Ambulatory Visit | Attending: Surgery | Admitting: Surgery

## 2018-05-22 DIAGNOSIS — C50911 Malignant neoplasm of unspecified site of right female breast: Secondary | ICD-10-CM

## 2018-05-22 MED ORDER — GADOBENATE DIMEGLUMINE 529 MG/ML IV SOLN
20.0000 mL | Freq: Once | INTRAVENOUS | Status: AC | PRN
  Administered 2018-05-22: 20 mL via INTRAVENOUS

## 2018-05-23 ENCOUNTER — Encounter: Payer: Self-pay | Admitting: Primary Care

## 2018-05-23 NOTE — Progress Notes (Signed)
The patient did not show today.  We have rescheduled her to receive her instructions on the day of treatment.  She has been told to take dexamethasone 8 mg twice daily on the day before therapy.  All her prescriptions have been entered.

## 2018-05-23 NOTE — Progress Notes (Signed)
FMLA successfully faxed to patient at work fax [phone]. Also mailed original to patient address on file.

## 2018-05-25 ENCOUNTER — Inpatient Hospital Stay: Payer: Managed Care, Other (non HMO) | Attending: Oncology | Admitting: Oncology

## 2018-05-25 DIAGNOSIS — C50211 Malignant neoplasm of upper-inner quadrant of right female breast: Secondary | ICD-10-CM | POA: Insufficient documentation

## 2018-05-25 DIAGNOSIS — D869 Sarcoidosis, unspecified: Secondary | ICD-10-CM

## 2018-05-25 DIAGNOSIS — Z170 Estrogen receptor positive status [ER+]: Secondary | ICD-10-CM

## 2018-05-25 DIAGNOSIS — R197 Diarrhea, unspecified: Secondary | ICD-10-CM | POA: Insufficient documentation

## 2018-05-25 DIAGNOSIS — Z5189 Encounter for other specified aftercare: Secondary | ICD-10-CM | POA: Insufficient documentation

## 2018-05-25 DIAGNOSIS — Z5111 Encounter for antineoplastic chemotherapy: Secondary | ICD-10-CM | POA: Insufficient documentation

## 2018-05-25 DIAGNOSIS — Z5112 Encounter for antineoplastic immunotherapy: Secondary | ICD-10-CM | POA: Insufficient documentation

## 2018-05-25 DIAGNOSIS — C50311 Malignant neoplasm of lower-inner quadrant of right female breast: Secondary | ICD-10-CM

## 2018-05-25 MED ORDER — DEXAMETHASONE 4 MG PO TABS: 8.0000 mg | ORAL_TABLET | Freq: Two times a day (BID) | ORAL | 1 refills | Status: DC

## 2018-05-25 MED ORDER — LORAZEPAM 0.5 MG PO TABS: 0.5000 mg | ORAL_TABLET | Freq: Every evening | ORAL | 0 refills | Status: DC | PRN

## 2018-05-25 MED ORDER — PROCHLORPERAZINE MALEATE 10 MG PO TABS: 10.0000 mg | ORAL_TABLET | Freq: Four times a day (QID) | ORAL | 1 refills | Status: DC | PRN

## 2018-05-25 MED ORDER — LIDOCAINE-PRILOCAINE 2.5-2.5 % EX CREA
TOPICAL_CREAM | CUTANEOUS | 3 refills | Status: DC
Start: 2018-05-25 — End: 2018-12-27

## 2018-05-28 ENCOUNTER — Encounter: Payer: Self-pay | Admitting: Internal Medicine

## 2018-05-28 ENCOUNTER — Ambulatory Visit (INDEPENDENT_AMBULATORY_CARE_PROVIDER_SITE_OTHER): Payer: Managed Care, Other (non HMO) | Admitting: Internal Medicine

## 2018-05-28 DIAGNOSIS — B20 Human immunodeficiency virus [HIV] disease: Secondary | ICD-10-CM

## 2018-05-28 DIAGNOSIS — F330 Major depressive disorder, recurrent, mild: Secondary | ICD-10-CM

## 2018-05-28 DIAGNOSIS — L732 Hidradenitis suppurativa: Secondary | ICD-10-CM | POA: Diagnosis not present

## 2018-05-28 MED ORDER — EMTRICITABINE-TENOFOVIR AF 200-25 MG PO TABS: 1.0000 | ORAL_TABLET | Freq: Every day | ORAL | 11 refills | Status: DC

## 2018-05-28 MED ORDER — DOXYCYCLINE HYCLATE 100 MG PO TABS: 100.0000 mg | ORAL_TABLET | Freq: Two times a day (BID) | ORAL | 5 refills | Status: DC

## 2018-05-28 MED ORDER — DOLUTEGRAVIR SODIUM 50 MG PO TABS: 50.0000 mg | ORAL_TABLET | Freq: Every day | ORAL | 11 refills | Status: DC

## 2018-05-28 NOTE — Progress Notes (Signed)
Patient Active Problem List   Diagnosis Date Noted  . Human immunodeficiency virus (HIV) disease ([place]) 07/24/2006    Priority: High  . Depression 07/24/2006    Priority: High  . Hidradenitis suppurativa of right >> left axilla 01/30/2012    Priority: Medium  . 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 Cigarette smoker 02/08/2007    Priority: Medium  . Essential hypertension 07/24/2006    Priority: Medium  . Asthma 07/24/2006    Priority: Medium  . Malignant neoplasm of lower-inner quadrant of right breast of female, estrogen receptor positive ([place]) 05/10/2018  . DOE (dyspnea on exertion) 10/13/2017  . Hyperlipidemia 04/26/2017  . Chronic anxiety 04/11/2017  . Upper airway cough syndrome 11/11/2016  . Morbid (severe) obesity due to excess calories ([place]) 10/14/2016  . Shortness of breath 10/13/2016  . Allergic rhinitis 02/18/2016  . Right shoulder pain 02/18/2016  . Type 2 diabetes mellitus ([place]) 06/23/2015  . Renal calculi 10/29/2014  . Recurrent boils 06/11/2014  . Abscess of breast, right 01/09/2013  . SARCOIDOSIS 02/08/2007  . GERD 07/24/2006    Patient's Medications  New Prescriptions   DOLUTEGRAVIR ([name]) 50 MG TABLET    Take 1 tablet (50 mg total) by mouth daily.   DOXYCYCLINE (VIBRA-TABS) 100 MG TABLET    Take 1 tablet (100 mg total) by mouth 2 (two) times daily.   EMTRICITABINE-TENOFOVIR AF (DESCOVY) 200-25 MG TABLET    Take 1 tablet by mouth daily.  Previous Medications   AMLODIPINE (NORVASC) 10 MG TABLET    Take 1 tablet (10 mg total) by mouth daily.   ATORVASTATIN (LIPITOR) 20 MG TABLET    Take 1 tablet by mouth every evening for high cholesterol. NEED APPOINTMENT FOR ANY MORE REFILLS   DEXAMETHASONE (DECADRON) 4 MG TABLET    Take 2 tablets (8 mg total) by mouth 2 (two) times daily. Start the day before Taxotere. Take once the day after, then 2 times a day x 2d.   GLUCOSE BLOOD (ONETOUCH VERIO) TEST STRIP    Use as instructed  to test blood sugar daily   HYDRALAZINE (APRESOLINE) 25 MG TABLET    TAKE 1 TABLET BY MOUTH THREE TIMES A DAY   IRBESARTAN-HYDROCHLOROTHIAZIDE (AVALIDE) 150-12.5 MG TABLET    TAKE 1 TABLET BY MOUTH ONCE DAILY FOR BLOOD PRESSURE.   LEVOTHYROXINE (SYNTHROID, LEVOTHROID) 175 MCG TABLET    TAKE 1 TABLET BY MOUTH EVERY MORNING ON AN EMPTY STOMACH WITH A FULL GLASS OF WATER.   LIDOCAINE-PRILOCAINE (EMLA) CREAM    Apply to affected area once   LORAZEPAM (ATIVAN) 0.5 MG TABLET    Take 1 tablet (0.5 mg total) by mouth at bedtime as needed (Nausea or vomiting).   METFORMIN (GLUCOPHAGE) 500 MG TABLET    TAKE 1 TABLET BY MOUTH TWICE A DAY WITH A MEAL (DUE FOR APPT FOR ADDITIONAL REFILLS)   METOPROLOL SUCCINATE (TOPROL-XL) 50 MG 24 HR TABLET    TAKE 1 TABLET (50 MG TOTAL) BY MOUTH DAILY. TAKE WITH OR IMMEDIATELY FOLLOWING A MEAL.   MOMETASONE (NASONEX) 50 MCG/ACT NASAL SPRAY    PLACE 2 SPRAYS INTO THE NOSE DAILY.   MULTIPLE VITAMIN (MULTIVITAMIN) TABLET    Take 1 tablet by mouth daily.   ONE TOUCH LANCETS MISC    Use as instructed to test blood sugar daily   OXYCODONE (OXY IR/ROXICODONE) 5 MG IMMEDIATE RELEASE TABLET    Take 1 tablet (5 mg total) by mouth  every 6 (six) hours as needed for moderate pain or severe pain.   PANTOPRAZOLE (PROTONIX) 40 MG TABLET    Take 40 mg by mouth daily as needed (for indigestion/acid reflux.).    PROAIR RESPICLICK 150 (90 BASE) MCG/ACT AEPB    INHALE 2 PUFFS INTO THE LUNGS EVERY 6 HOURS AS NEEDED FOR WHEEZING OR SHORTNESS OF BREATH.   PROCHLORPERAZINE (COMPAZINE) 10 MG TABLET    Take 1 tablet (10 mg total) by mouth every 6 (six) hours as needed (Nausea or vomiting).  Modified Medications   No medications on file  Discontinued Medications   ODEFSEY 200-25-25 MG TABS TABLET    TAKE 1 TABLET BY MOUTH DAILY.    Subjective: [name] is in for her routine HIV follow-up visit.  She has had no problems obtaining, taking or tolerating her Odefsey and has not been missing doses.  She  was recently diagnosed with right breast cancer and is set to undergo chemotherapy later this week.  A Port-A-Cath has been placed.  Her hidradenitis continues to smolder along.  She had to have incision and drainage of one lesion under her right breast about 2 months ago.  She has been feeling somewhat depressed but feels like she will be able to manage her cancer therapy.  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  Review of Systems: Review of Systems  Constitutional: Negative for chills, diaphoresis, fever, malaise/fatigue and weight loss.  Respiratory: Negative for cough.   Cardiovascular: Negative for chest pain.  Gastrointestinal: Negative for abdominal pain, diarrhea, nausea and vomiting.  Genitourinary: Negative for dysuria.  Musculoskeletal: Positive for back pain.  Neurological: Negative for headaches.  Psychiatric/Behavioral: Positive for depression. Negative for suicidal ideas.    Past Medical History:  Diagnosis Date  . Anemia    Normocytic  . Anxiety   . Asthma   . Blood dyscrasia   . Bronchitis 2005  . 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 Diabetes mellitus without complication ([place])   . Gastroenteritis 07/10/07  . GERD 07/24/2006  . GRAVE'S DISEASE 01/01/2008  . History of hidradenitis suppurativa   . History of kidney stones   . History of thrush   . HIV DISEASE 07/24/2006   dx March 05  . HYPERTENSION 07/24/2006  . Hyperthyroidism 08/2006   Grave's Disease -diffuse radiotracer uptake 08/25/06 Thyroid scan-Cold nodule to R lower lobe of thyrorid  . Menometrorrhagia    hx of  . Nephrolithiasis   . 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 Pneumonia 2005  . 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 Sarcoidosis 02/08/2007   dx as a teenager in [place] from abnl CXR. Completed 2 yrs Prednisone after lung bx confirmation. No symptoms since then.  . Thyroid cancer ([place])     . THYROID NODULE, RIGHT 02/08/2007    Social History   Tobacco Use  . Smoking status: Former Smoker    Packs/day: 0.50    Years: 15.00    Pack years: 7.50    Types: Cigarettes    Start date: 04/12/2017  . Smokeless tobacco: Never Used  Substance Use Topics  . Alcohol use: Yes    Alcohol/week: 0.0 standard drinks    Comment: social  . Drug use: No    Family History  Problem Relation Age of Onset  . Hypertension Mother   . Cancer Mother        throat  . Heart disease Mother  stent  . Hypertension Father   . Cancer Other        FH of Breast Cancer  . Cancer Other   . Heart disease Other   . Hypertension Other   . Stroke Other        Grandparent  . Kidney disease Other        Grandparent  . Diabetes Other        FH of Diabetes  . Hypertension Sister   . Cancer Maternal Uncle        Stomach Cancer, Lung CA  . Hypertension Brother   . Hypertension Sister   . Breast cancer Maternal Aunt   . Breast cancer Paternal Aunt 90    Allergies  Allergen Reactions  . Tape Rash    Rash   . Genvoya [Elviteg-Cobic-Emtricit-Tenofaf] Hives  . Lisinopril Cough  . Xylocaine [Lidocaine]     Unknown , patient states she has had lidocaine for IV starts and at the dentist with no problems  . Valsartan Cough    Pt states she tolerates medicine now    Health Maintenance  Topic Date Due  . FOOT EXAM  05/14/1978  . PAP SMEAR  01/28/2014  . OPHTHALMOLOGY EXAM  07/15/2016  . HEMOGLOBIN A1C  10/05/2017  . COLONOSCOPY  05/14/2018  . INFLUENZA VACCINE  05/17/2018  . TETANUS/TDAP  10/18/2019  . MAMMOGRAM  04/20/2020  . PNEUMOCOCCAL POLYSACCHARIDE VACCINE AGE 96-64 HIGH RISK  Completed  . HIV Screening  Completed    Objective:  Vitals:   05/28/18 1402  BP: (!) 148/84  Pulse: 82  Temp: 98.3 F (36.8 C)  Weight: 244 lb 12.8 oz (111 kg)   Body mass index is 40.74 kg/m.  Physical Exam  Constitutional: She is oriented to person, place, and time.  She is in good  spirits as usual.  Cardiovascular: Normal rate, regular rhythm and normal heart sounds.  Pulmonary/Chest: Effort normal and breath sounds normal.    Neurological: She is alert and oriented to person, place, and time.  Skin: No rash noted.  Psychiatric: She has a normal mood and affect.    Lab Results Lab Results  Component Value Date   WBC 8.1 05/10/2018   HGB 11.8 05/10/2018   HCT 36.3 05/10/2018   MCV 85.2 05/10/2018   PLT 334 05/10/2018    Lab Results  Component Value Date   CREATININE 1.28 (H) 05/10/2018   BUN 19 05/10/2018   NA 144 05/10/2018   K 3.3 (L) 05/10/2018   CL 105 05/10/2018   CO2 28 05/10/2018    Lab Results  Component Value Date   ALT 23 05/10/2018   AST 28 05/10/2018   ALKPHOS 106 05/10/2018   BILITOT <0.2 (L) 05/10/2018    Lab Results  Component Value Date   CHOL 117 10/23/2017   HDL 33 (L) 10/23/2017   LDLCALC 61 10/23/2017   TRIG 149 10/23/2017   CHOLHDL 3.5 10/23/2017   Lab Results  Component Value Date   LABRPR NON-REACTIVE 10/23/2017   HIV 1 RNA Quant (copies/mL)  Date Value  10/23/2017 51 (H)  03/20/2017 <20 NOT DETECTED  09/02/2016 <20   CD4 T Cell Abs (/uL)  Date Value  10/23/2017 1,250  03/20/2017 1,520  09/02/2016 1,190     Problem List Items Addressed This Visit      High   Depression    She has mild, reactive depression.  She is aware that we have mental health counselors here in clinic that  she can speak to at any time.      Human immunodeficiency virus (HIV) disease ([place])    Her adherence with Vernell Leep is excellent and her infection has been under very good control.  Unfortunately the dexamethasone which will be part of her chemotherapy regimen will interact with the rilpivirine component of the [facility], making it less effective.  Her other options are Triumeq but we will need to check her HLA B5 701 first to see if she is a candidate.  I will go ahead and switch her to [place] now.  I have warned her to  let me know if she has any side effects especially hives which she had when taking Genvoya.  She will follow-up here in 6 weeks.  I plan on monitoring her CD4 count and viral load more closely while she is on chemotherapy.  I helped her sign up for MyChart while she was here today.      Relevant Medications   emtricitabine-tenofovir AF (DESCOVY) 200-25 MG tablet   dolutegravir ([name]) 50 MG tablet   Other Relevant Orders   T-helper cell (CD4)- ([facility] clinic only)   HIV 1 RNA quant-no reflex-bld   HLA B*[id number]     Medium   Hidradenitis suppurativa of right >> left axilla    I will start her on doxycycline to try to suppress her hidradenitis while she is on chemotherapy.      Relevant Medications   emtricitabine-tenofovir AF (DESCOVY) 200-25 MG tablet   dolutegravir ([name]) 50 MG tablet   doxycycline (VIBRA-TABS) 100 MG tablet        Michel Bickers, MD [facility] for [place] [phone] pager   [phone] cell 05/28/2018, 2:54 PM

## 2018-05-28 NOTE — Assessment & Plan Note (Signed)
I will start her on doxycycline to try to suppress her hidradenitis while she is on chemotherapy.

## 2018-05-28 NOTE — Assessment & Plan Note (Addendum)
Her adherence with Vernell Leep is excellent and her infection has been under very good control.  Unfortunately the dexamethasone which will be part of her chemotherapy regimen will interact with the rilpivirine component of the [facility], making it less effective.  Her other options are Triumeq but we will need to check her HLA B5 701 first to see if she is a candidate.  I will go ahead and switch her to [place] now.  I have warned her to let me know if she has any side effects especially hives which she had when taking Genvoya.  She will follow-up here in 6 weeks.  I plan on monitoring her CD4 count and viral load more closely while she is on chemotherapy.  I helped her sign up for MyChart while she was here today.

## 2018-05-28 NOTE — Assessment & Plan Note (Signed)
She has mild, reactive depression.  She is aware that we have mental health counselors here in clinic that she can speak to at any time.

## 2018-05-29 LAB — T-HELPER CELL (CD4) - (RCID CLINIC ONLY)
CD4 % Helper T Cell: 39 % (ref 33–55)
CD4 T CELL ABS: 1250 /uL (ref 400–2700)

## 2018-05-30 NOTE — Progress Notes (Signed)
Per Dr.Magrinat, contact surgeon for port placement/ check and okay to start first time TCHP peripherally.

## 2018-05-30 NOTE — Telephone Encounter (Signed)
Late Entry. Send patient a message through [place] and send a letter.

## 2018-05-31 ENCOUNTER — Inpatient Hospital Stay: Payer: Managed Care, Other (non HMO)

## 2018-05-31 ENCOUNTER — Other Ambulatory Visit: Payer: Self-pay

## 2018-05-31 ENCOUNTER — Ambulatory Visit: Payer: Self-pay

## 2018-05-31 ENCOUNTER — Encounter: Payer: Self-pay | Admitting: *Deleted

## 2018-05-31 ENCOUNTER — Inpatient Hospital Stay (HOSPITAL_BASED_OUTPATIENT_CLINIC_OR_DEPARTMENT_OTHER): Payer: Managed Care, Other (non HMO) | Admitting: Adult Health

## 2018-05-31 ENCOUNTER — Encounter: Payer: Self-pay | Admitting: Adult Health

## 2018-05-31 VITALS — BP 157/78 | HR 71 | Temp 98.7°F | Resp 16

## 2018-05-31 VITALS — BP 146/91 | HR 65 | Temp 97.9°F | Resp 18 | Ht 65.0 in | Wt 246.1 lb

## 2018-05-31 DIAGNOSIS — Z5189 Encounter for other specified aftercare: Secondary | ICD-10-CM | POA: Diagnosis not present

## 2018-05-31 DIAGNOSIS — Z801 Family history of malignant neoplasm of trachea, bronchus and lung: Secondary | ICD-10-CM

## 2018-05-31 DIAGNOSIS — Z170 Estrogen receptor positive status [ER+]: Principal | ICD-10-CM

## 2018-05-31 DIAGNOSIS — Z800 Family history of malignant neoplasm of digestive organs: Secondary | ICD-10-CM

## 2018-05-31 DIAGNOSIS — C50211 Malignant neoplasm of upper-inner quadrant of right female breast: Secondary | ICD-10-CM | POA: Diagnosis present

## 2018-05-31 DIAGNOSIS — I10 Essential (primary) hypertension: Secondary | ICD-10-CM

## 2018-05-31 DIAGNOSIS — Z85850 Personal history of malignant neoplasm of thyroid: Secondary | ICD-10-CM

## 2018-05-31 DIAGNOSIS — Z5111 Encounter for antineoplastic chemotherapy: Secondary | ICD-10-CM | POA: Diagnosis not present

## 2018-05-31 DIAGNOSIS — C50311 Malignant neoplasm of lower-inner quadrant of right female breast: Secondary | ICD-10-CM

## 2018-05-31 DIAGNOSIS — R197 Diarrhea, unspecified: Secondary | ICD-10-CM | POA: Diagnosis not present

## 2018-05-31 DIAGNOSIS — B20 Human immunodeficiency virus [HIV] disease: Secondary | ICD-10-CM | POA: Diagnosis not present

## 2018-05-31 DIAGNOSIS — Z803 Family history of malignant neoplasm of breast: Secondary | ICD-10-CM

## 2018-05-31 DIAGNOSIS — C73 Malignant neoplasm of thyroid gland: Secondary | ICD-10-CM

## 2018-05-31 DIAGNOSIS — Z87891 Personal history of nicotine dependence: Secondary | ICD-10-CM

## 2018-05-31 DIAGNOSIS — Z5112 Encounter for antineoplastic immunotherapy: Secondary | ICD-10-CM | POA: Diagnosis present

## 2018-05-31 LAB — CBC WITH DIFFERENTIAL/PLATELET
BASOS PCT: 1 %
Basophils Absolute: 0 10*3/uL (ref 0.0–0.1)
EOS ABS: 0.2 10*3/uL (ref 0.0–0.5)
Eosinophils Relative: 3 %
HEMATOCRIT: 33.3 % — AB (ref 34.8–46.6)
HEMOGLOBIN: 11.1 g/dL — AB (ref 11.6–15.9)
LYMPHS ABS: 2.2 10*3/uL (ref 0.9–3.3)
Lymphocytes Relative: 32 %
MCH: 27.9 pg (ref 25.1–34.0)
MCHC: 33.2 g/dL (ref 31.5–36.0)
MCV: 84.1 fL (ref 79.5–101.0)
MONO ABS: 0.5 10*3/uL (ref 0.1–0.9)
Monocytes Relative: 8 %
NEUTROS ABS: 4 10*3/uL (ref 1.5–6.5)
NEUTROS PCT: 56 %
Platelets: 354 10*3/uL (ref 145–400)
RBC: 3.96 MIL/uL (ref 3.70–5.45)
RDW: 18.4 % — AB (ref 11.2–14.5)
WBC: 7 10*3/uL (ref 3.9–10.3)

## 2018-05-31 LAB — COMPREHENSIVE METABOLIC PANEL
ALBUMIN: 3.3 g/dL — AB (ref 3.5–5.0)
ALK PHOS: 107 U/L (ref 38–126)
ALT: 13 U/L (ref 0–44)
AST: 14 U/L — ABNORMAL LOW (ref 15–41)
Anion gap: 10 (ref 5–15)
BUN: 19 mg/dL (ref 6–20)
CALCIUM: 9.3 mg/dL (ref 8.9–10.3)
CHLORIDE: 105 mmol/L (ref 98–111)
CO2: 27 mmol/L (ref 22–32)
CREATININE: 1.3 mg/dL — AB (ref 0.44–1.00)
GFR calc non Af Amer: 47 mL/min — ABNORMAL LOW (ref >60)
GFR, EST AFRICAN AMERICAN: 54 mL/min — AB (ref >60)
GLUCOSE: 167 mg/dL — AB (ref 70–99)
Potassium: 3.8 mmol/L (ref 3.5–5.1)
SODIUM: 142 mmol/L (ref 135–145)
Total Bilirubin: <0.2 mg/dL — ABNORMAL LOW (ref 0.3–1.2)
Total Protein: 7.6 g/dL (ref 6.5–8.1)

## 2018-05-31 MED ORDER — SODIUM CHLORIDE 0.9 % IV SOLN
Freq: Once | INTRAVENOUS | Status: AC
  Administered 2018-05-31: 12:00:00 via INTRAVENOUS
  Filled 2018-05-31: qty 250

## 2018-05-31 MED ORDER — SODIUM CHLORIDE 0.9 % IV SOLN
Freq: Once | INTRAVENOUS | Status: AC
  Administered 2018-05-31: 13:00:00 via INTRAVENOUS
  Filled 2018-05-31: qty 5

## 2018-05-31 MED ORDER — ACETAMINOPHEN 325 MG PO TABS: 650.0000 mg | ORAL_TABLET | Freq: Once | ORAL | Status: DC

## 2018-05-31 MED ORDER — PALONOSETRON HCL INJECTION 0.25 MG/5ML
INTRAVENOUS | Status: AC
  Filled 2018-05-31: qty 5

## 2018-05-31 MED ORDER — DIPHENHYDRAMINE HCL 25 MG PO CAPS: 50.0000 mg | ORAL_CAPSULE | Freq: Once | ORAL | Status: DC

## 2018-05-31 MED ORDER — SODIUM CHLORIDE 0.9 % IV SOLN
75.0000 mg/m2 | Freq: Once | INTRAVENOUS | Status: AC
  Administered 2018-05-31: 170 mg via INTRAVENOUS
  Filled 2018-05-31: qty 17

## 2018-05-31 MED ORDER — SODIUM CHLORIDE 0.9 % IV SOLN
581.0000 mg | Freq: Once | INTRAVENOUS | Status: AC
  Administered 2018-05-31: 580 mg via INTRAVENOUS
  Filled 2018-05-31: qty 58

## 2018-05-31 MED ORDER — PALONOSETRON HCL INJECTION 0.25 MG/5ML
0.2500 mg | Freq: Once | INTRAVENOUS | Status: AC
  Administered 2018-05-31: 0.25 mg via INTRAVENOUS

## 2018-05-31 NOTE — Progress Notes (Signed)
[place]  [phone] Fax:([phone]) [phone]     ID: JONIA OAKEY DOB: [birth year]  MR#: [id number]  ZHY#:[id number]  Patient Care Team: [name], NP as PCP - General (Internal Medicine) Michel Bickers, MD as PCP - Infectious Diseases (Infectious Diseases) Magrinat, Virgie Dad, MD as Consulting Physician (Oncology) Kyung Rudd, MD as Consulting Physician (Radiation Oncology) Coralie Keens, MD as Consulting Physician (General Surgery) OTHER MD:   CHIEF COMPLAINT: Triple positive breast cancer  CURRENT TREATMENT: Neoadjuvant chemotherapy   HISTORY OF CURRENT ILLNESS: [name] (pronounced "Huff") palpated a mass in the right breast for about 2 weeks before bringing it to medical attention. She underwent bilateral diagnostic mammography with tomography and right breast ultrasonography at [facility] on 04/20/2018 showing: breast density category B. There was a highly suspicious mass located at 3 o'clock in the upper inner quadrant measuring 2.9 x 2.5 x 1.6 cm and 4 cm from the nipple. Ultrasound of the right axilla showed 5 lymph nodes with abnormal cortical thickening. The left axilla showed 3 lymph nodes with abnormal cortical thickening.  Accordingly on 04/26/2018 she proceeded to biopsy of the right breast area in question. The pathology from this procedure showed ([id number]): Invasive ductal carcinoma, grade II with calcifications. One right axillary lymph node and one left axillary lymph node were negative for carcinoma. Prognostic indicators significant for: estrogen receptor, 100% positive and progesterone receptor, 80% positive, both with strong staining intensity. Proliferation marker Ki67 at 70%. HER2 amplified with ratios HER2/CEP17 signals 4.52 and average HER2 copies per cell 10.85  The patient's subsequent history is as detailed below.  INTERVAL HISTORY: Diann is here today for follow up and evaluation of her HER-2 positive  breast cancer.  She is due to start chemotherapy with Docetaxel, Carboplatin, Trastuzumab and Pertuzumab today.  She is ready to begin.  Aryahna has been feeling well lately.    REVIEW OF SYSTEMS: Ayelet is doing well today.  She underwent port placement a couple of weeks ago and tolerated it well.  She has all of her anti nausea medications.  She denies any baseline neuropathy, nausea, vomiting, constipation, diarrhea, chest pain, palpitations, or any other concerns.  A detailed ROS was otherwise non contributory.     PAST MEDICAL HISTORY: Past Medical History:  Diagnosis Date  . Anemia    Normocytic  . Anxiety   . Asthma   . Blood dyscrasia   . Bronchitis 2005  . 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 Diabetes mellitus without complication ([place])   . Gastroenteritis 07/10/07  . GERD 07/24/2006  . GRAVE'S DISEASE 01/01/2008  . History of hidradenitis suppurativa   . History of kidney stones   . History of thrush   . HIV DISEASE 07/24/2006   dx March 05  . HYPERTENSION 07/24/2006  . Hyperthyroidism 08/2006   Grave's Disease -diffuse radiotracer uptake 08/25/06 Thyroid scan-Cold nodule to R lower lobe of thyrorid  . Menometrorrhagia    hx of  . Nephrolithiasis   . 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 Pneumonia 2005  . 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 Sarcoidosis 02/08/2007   dx as a teenager in [place] from abnl CXR. Completed 2 yrs Prednisone after lung bx confirmation. No symptoms since then.  . Thyroid cancer ([place])   . THYROID NODULE, RIGHT 02/08/2007  -2018 thyroid nodules were precancerous but pathology was negative for thyroid cancer.   PAST SURGICAL HISTORY: Past Surgical History:  Procedure  Laterality Date  . BREAST SURGERY  1997   Breast Reduction   . CYSTOSCOPY W/ URETERAL STENT REMOVAL  11/09/2012   Procedure: CYSTOSCOPY WITH STENT REMOVAL;  Surgeon: Alexis Frock, MD;  Location: [facility];   Service: Urology;  Laterality: Right;  . CYSTOSCOPY WITH RETROGRADE PYELOGRAM, URETEROSCOPY AND STENT PLACEMENT  11/09/2012   Procedure: CYSTOSCOPY WITH RETROGRADE PYELOGRAM, URETEROSCOPY AND STENT PLACEMENT;  Surgeon: Alexis Frock, MD;  Location: [facility];  Service: Urology;  Laterality: Left;  LEFT URETEROSCOPY, STONE MANIPULATION, left STENT exchange   . CYSTOSCOPY WITH STENT PLACEMENT  10/02/2012   Procedure: CYSTOSCOPY WITH STENT PLACEMENT;  Surgeon: Alexis Frock, MD;  Location: [facility];  Service: Urology;  Laterality: Left;  . DILATION AND CURETTAGE OF UTERUS  Feb 2004   s/p for 1st trimester nonviable pregnancy  . EYE SURGERY     sty under eyelid  . INCISE AND DRAIN ABCESS  Nov 03   s/p I &D for righ inframmary fold hidradenitis  . INCISION AND DRAINAGE PERITONSILLAR ABSCESS  Mar 03  . IRRIGATION AND DEBRIDEMENT ABSCESS  01/31/2012   Procedure: IRRIGATION AND DEBRIDEMENT ABSCESS;  Surgeon: Shann Medal, MD;  Location: [facility];  Service: General;  Laterality: Right;  right breast and axilla   . NEPHROLITHOTOMY  10/02/2012   Procedure: NEPHROLITHOTOMY PERCUTANEOUS;  Surgeon: Alexis Frock, MD;  Location: [facility];  Service: Urology;  Laterality: Right;  First Stage Percutaneous Nephrolithotomy with Surgeon Access, Left Ureteral Stent    . NEPHROLITHOTOMY  10/04/2012   Procedure: NEPHROLITHOTOMY PERCUTANEOUS SECOND LOOK;  Surgeon: Alexis Frock, MD;  [facility];  Service: Urology;  Laterality: Right;     . NEPHROLITHOTOMY  10/08/2012   Procedure: NEPHROLITHOTOMY PERCUTANEOUS;  Surgeon: Alexis Frock, MD;  Location[facility];  Service: Urology;  Laterality: Right;  THIRD STAGE, nephrostomy tube exchange x 2  . NEPHROLITHOTOMY  10/11/2012   Procedure: NEPHROLITHOTOMY PERCUTANEOUS SECOND LOOK;  Surgeon: Alexis Frock, MD;  [facility];  Service: Urology;  Laterality: Right;  RIGHT 4 STAGE PERCUTANOUS NEPHROLITHOTOMY, right URETEROSCOPY WITH HOLMIUM LASER   . PORTACATH PLACEMENT  Left 05/17/2018   Procedure: INSERTION PORT-A-CATH;  Surgeon: Coralie Keens, MD;  Location: [name];  Service: General;  Laterality: Left;  . RADICAL NECK DISSECTION  04/12/2017   limited/notes 04/12/2017  . RADICAL NECK DISSECTION N/A 04/12/2017   Procedure: RADICAL NECK DISSECTION;  Surgeon: Melida Quitter, MD;  Location: [facility];  Service: ENT;  Laterality: N/A;  limited neck dissection 2 hours total  . REDUCTION MAMMAPLASTY Bilateral 1998  . [place]  1994  . THYROIDECTOMY  04/12/2017   completion/notes 04/12/2017  . THYROIDECTOMY N/A 04/12/2017   Procedure: THYROIDECTOMY;  Surgeon: Melida Quitter, MD;  Location: [facility];  Service: ENT;  Laterality: N/A;  Completion Thyroidectomy  . TOTAL THYROIDECTOMY  2010  Thyroid nodules removed- pathology at the ENT doctor 2018 were benign -Over active thyroid and thyroidectomy with benign pathology  FAMILY HISTORY Family History  Problem Relation Age of Onset  . Hypertension Mother   . Cancer Mother        throat  . Heart disease Mother        stent  . Hypertension Father   . Cancer Other        FH of Breast Cancer  . Cancer Other   . Heart disease Other   . Hypertension Other   . Stroke Other        Grandparent  . Kidney disease Other  Grandparent  . Diabetes Other        FH of Diabetes  . Hypertension Sister   . Cancer Maternal Uncle        Stomach Cancer, Lung CA  . Hypertension Brother   . Hypertension Sister   . Breast cancer Maternal Aunt   . Breast cancer Paternal Aunt 4  The patient's mother is alive at age 48. The patient's father died at 75 from lung cancer (heavy smoker). She does not know much about her father. The patient has 1 brother and 2 sisters. The patient's mother had a history of laryngeal cancer. There was a maternal cousin with breast cancer diagnosed at age 51. There were 2 maternal aunts with breast cancer, the youngest being diagnosed in the 34's. There was a maternal great aunt with breast  cancer. There were 2 paternal aunts with breast cancer.    GYNECOLOGIC HISTORY:  Patient's last menstrual period was 03/31/2014. Menarche: 52 years old Age at first live birth: 52 years old She is GX P1.  Her LMP was in 2017. She did not use HRT.    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 At home is the patient's daughter, Verdie Drown, age 39, who is a Engineer, technical sales education and working part-time at the [employer]. The patient attends [employer].    ADVANCED DIRECTIVES: Not in place; at the 05/10/2018 visit the patient was given the appropriate documents to complete and notarized at her discretion   HEALTH MAINTENANCE: Social History   Tobacco Use  . Smoking status: Former Smoker    Packs/day: 0.50    Years: 15.00    Pack years: 7.50    Types: Cigarettes    Start date: 04/12/2017  . Smokeless tobacco: Never Used  Substance Use Topics  . Alcohol use: Yes    Alcohol/week: 0.0 standard drinks    Comment: social  . Drug use: No     Colonoscopy: no  PAP: [facility]. 2018  Bone density: remote   Allergies  Allergen Reactions  . Tape Rash    Rash   . Genvoya [Elviteg-Cobic-Emtricit-Tenofaf] Hives  . Lisinopril Cough  . Xylocaine [Lidocaine]     Unknown , patient states she has had lidocaine for IV starts and at the dentist with no problems  . Valsartan Cough    Pt states she tolerates medicine now    Current Outpatient Medications  Medication Sig Dispense Refill  . amLODipine (NORVASC) 10 MG tablet Take 1 tablet (10 mg total) by mouth daily. 90 tablet 0  . atorvastatin (LIPITOR) 20 MG tablet Take 1 tablet by mouth every evening for high cholesterol. NEED APPOINTMENT FOR ANY MORE REFILLS 30 tablet 0  . dexamethasone (DECADRON) 4 MG tablet Take 2 tablets (8 mg total) by mouth 2 (two) times daily. Start the day before Taxotere. Take once the day after, then 2 times a day x 2d. 30 tablet 1  . dolutegravir  ([name]) 50 MG tablet Take 1 tablet (50 mg total) by mouth daily. 30 tablet 11  . doxycycline (VIBRA-TABS) 100 MG tablet Take 1 tablet (100 mg total) by mouth 2 (two) times daily. 60 tablet 5  . emtricitabine-tenofovir AF (DESCOVY) 200-25 MG tablet Take 1 tablet by mouth daily. 30 tablet 11  . glucose blood (ONETOUCH VERIO) test strip Use as instructed to test blood sugar daily 100 each 5  . hydrALAZINE (APRESOLINE) 25 MG tablet TAKE 1 TABLET BY MOUTH THREE TIMES A  DAY 270 tablet 1  . irbesartan-hydrochlorothiazide (AVALIDE) 150-12.5 MG tablet TAKE 1 TABLET BY MOUTH ONCE DAILY FOR BLOOD PRESSURE. 90 tablet 1  . levothyroxine (SYNTHROID, LEVOTHROID) 175 MCG tablet TAKE 1 TABLET BY MOUTH EVERY MORNING ON AN EMPTY STOMACH WITH A FULL GLASS OF WATER. 90 tablet 0  . lidocaine-prilocaine (EMLA) cream Apply to affected area once 30 g 3  . LORazepam (ATIVAN) 0.5 MG tablet Take 1 tablet (0.5 mg total) by mouth at bedtime as needed (Nausea or vomiting). 20 tablet 0  . metFORMIN (GLUCOPHAGE) 500 MG tablet TAKE 1 TABLET BY MOUTH TWICE A DAY WITH A MEAL (DUE FOR APPT FOR ADDITIONAL REFILLS) 60 tablet 0  . metoprolol succinate (TOPROL-XL) 50 MG 24 hr tablet TAKE 1 TABLET (50 MG TOTAL) BY MOUTH DAILY. TAKE WITH OR IMMEDIATELY FOLLOWING A MEAL. 90 tablet 1  . mometasone (NASONEX) 50 MCG/ACT nasal spray PLACE 2 SPRAYS INTO THE NOSE DAILY. 17 g 3  . Multiple Vitamin (MULTIVITAMIN) tablet Take 1 tablet by mouth daily.    . ONE TOUCH LANCETS MISC Use as instructed to test blood sugar daily 200 each 5  . oxyCODONE (OXY IR/ROXICODONE) 5 MG immediate release tablet Take 1 tablet (5 mg total) by mouth every 6 (six) hours as needed for moderate pain or severe pain. 20 tablet 0  . pantoprazole (PROTONIX) 40 MG tablet Take 40 mg by mouth daily as needed (for indigestion/acid reflux.).     Marland Kitchen PROAIR RESPICLICK 235 (90 Base) MCG/ACT AEPB INHALE 2 PUFFS INTO THE LUNGS EVERY 6 HOURS AS NEEDED FOR WHEEZING OR SHORTNESS OF BREATH. 1  each 2  . prochlorperazine (COMPAZINE) 10 MG tablet Take 1 tablet (10 mg total) by mouth every 6 (six) hours as needed (Nausea or vomiting). 30 tablet 1   No current facility-administered medications for this visit.    Facility-Administered Medications Ordered in Other Visits  Medication Dose Route Frequency Provider Last Rate Last Dose  . CARBOplatin (PARAPLATIN) 580 mg in sodium chloride 0.9 % 250 mL chemo infusion  580 mg Intravenous Once Magrinat, Virgie Dad, MD      . DOCEtaxel (TAXOTERE) 170 mg in sodium chloride 0.9 % 250 mL chemo infusion  75 mg/m2 (Treatment Plan Recorded) Intravenous Once Magrinat, Virgie Dad, MD        OBJECTIVE: Vitals:   05/31/18 0930  BP: (!) 146/91  Pulse: 65  Resp: 18  Temp: 97.9 F (36.6 C)  SpO2: 100%     Body mass index is 40.95 kg/m.   Wt Readings from Last 3 Encounters:  05/31/18 246 lb 1.6 oz (111.6 kg)  05/28/18 244 lb 12.8 oz (111 kg)  05/17/18 246 lb (111.6 kg)  ECOG FS:1 - Symptomatic but completely ambulatory GENERAL: Patient is a well appearing female in no acute distress HEENT:  Sclerae anicteric.  Oropharynx clear and moist. No ulcerations or evidence of oropharyngeal candidiasis. Neck is supple.  NODES:  No cervical, supraclavicular, or axillary lymphadenopathy palpated.  BREAST EXAM:  Deferred. LUNGS:  Clear to auscultation bilaterally.  No wheezes or rhonchi. HEART:  Regular rate and rhythm. No murmur appreciated. ABDOMEN:  Soft, nontender.  Positive, normoactive bowel sounds. No organomegaly palpated. MSK:  No focal spinal tenderness to palpation. Full range of motion bilaterally in the upper extremities. EXTREMITIES:  No peripheral edema.   SKIN:  Clear with no obvious rashes or skin changes. No nail dyscrasia. NEURO:  Nonfocal. Well oriented.  Appropriate affect.     LAB RESULTS:  CMP  Component Value Date/Time   NA 142 05/31/2018 0850   K 3.8 05/31/2018 0850   CL 105 05/31/2018 0850   CO2 27 05/31/2018 0850    GLUCOSE 167 (H) 05/31/2018 0850   BUN 19 05/31/2018 0850   CREATININE 1.30 (H) 05/31/2018 0850   CREATININE 1.28 (H) 05/10/2018 1458   CREATININE 1.02 09/23/2015 1047   CALCIUM 9.3 05/31/2018 0850   PROT 7.6 05/31/2018 0850   ALBUMIN 3.3 (L) 05/31/2018 0850   AST 14 (L) 05/31/2018 0850   AST 28 05/10/2018 1458   ALT 13 05/31/2018 0850   ALT 23 05/10/2018 1458   ALKPHOS 107 05/31/2018 0850   BILITOT <0.2 (L) 05/31/2018 0850   BILITOT <0.2 (L) 05/10/2018 1458   GFRNONAA 47 (L) 05/31/2018 0850   GFRNONAA 48 (L) 05/10/2018 1458   GFRAA 54 (L) 05/31/2018 0850   GFRAA 56 (L) 05/10/2018 1458    No results found for: TOTALPROTELP, ALBUMINELP, A1GS, A2GS, BETS, BETA2SER, GAMS, MSPIKE, SPEI  No results found for: KPAFRELGTCHN, LAMBDASER, KAPLAMBRATIO  Lab Results  Component Value Date   WBC 7.0 05/31/2018   NEUTROABS 4.0 05/31/2018   HGB 11.1 (L) 05/31/2018   HCT 33.3 (L) 05/31/2018   MCV 84.1 05/31/2018   PLT 354 05/31/2018    _0 @  No results found for: LABCA2  No components found for: [id number]  No results for input(s): INR in the last 168 hours.  No results found for: LABCA2  No results found for: CHY850  No results found for: YDX412  No results found for: INO676  No results found for: CA2729  No components found for: HGQUANT  No results found for: CEA1 / No results found for: CEA1   No results found for: AFPTUMOR  No results found for: CHROMOGRNA  No results found for: PSA1  Appointment on 05/31/2018  Component Date Value Ref Range Status  . Sodium 05/31/2018 142  135 - 145 mmol/L Final  . Potassium 05/31/2018 3.8  3.5 - 5.1 mmol/L Final  . Chloride 05/31/2018 105  98 - 111 mmol/L Final  . CO2 05/31/2018 27  22 - 32 mmol/L Final  . Glucose, Bld 05/31/2018 167* 70 - 99 mg/dL Final  . BUN 05/31/2018 19  6 - 20 mg/dL Final  . Creatinine, Ser 05/31/2018 1.30* 0.44 - 1.00 mg/dL Final  . Calcium 05/31/2018 9.3  8.9 - 10.3 mg/dL Final  . Total  Protein 05/31/2018 7.6  6.5 - 8.1 g/dL Final  . Albumin 05/31/2018 3.3* 3.5 - 5.0 g/dL Final  . AST 05/31/2018 14* 15 - 41 U/L Final  . ALT 05/31/2018 13  0 - 44 U/L Final  . Alkaline Phosphatase 05/31/2018 107  38 - 126 U/L Final  . Total Bilirubin 05/31/2018 <0.2* 0.3 - 1.2 mg/dL Final  . GFR calc non Af Amer 05/31/2018 47* >60 mL/min Final  . GFR calc Af Amer 05/31/2018 54* >60 mL/min Final   Comment: (NOTE) The eGFR has been calculated using the CKD EPI equation. This calculation has not been validated in all clinical situations. eGFR's persistently <60 mL/min signify possible Chronic Kidney Disease.   Georgiann Hahn gap 05/31/2018 10  5 - 15 Final   Performed at [facility], [place] [address] [place], [place] [address]  . WBC 05/31/2018 7.0  3.9 - 10.3 K/uL Final  . RBC 05/31/2018 3.96  3.70 - 5.45 MIL/uL Final  . Hemoglobin 05/31/2018 11.1* 11.6 - 15.9 g/dL Final  . HCT 05/31/2018 33.3* 34.8 - 46.6 %  Final  . MCV 05/31/2018 84.1  79.5 - 101.0 fL Final  . MCH 05/31/2018 27.9  25.1 - 34.0 pg Final  . MCHC 05/31/2018 33.2  31.5 - 36.0 g/dL Final  . RDW 05/31/2018 18.4* 11.2 - 14.5 % Final  . Platelets 05/31/2018 354  145 - 400 K/uL Final  . Neutrophils Relative % 05/31/2018 56  % Final  . Neutro Abs 05/31/2018 4.0  1.5 - 6.5 K/uL Final  . Lymphocytes Relative 05/31/2018 32  % Final  . Lymphs Abs 05/31/2018 2.2  0.9 - 3.3 K/uL Final  . Monocytes Relative 05/31/2018 8  % Final  . Monocytes Absolute 05/31/2018 0.5  0.1 - 0.9 K/uL Final  . Eosinophils Relative 05/31/2018 3  % Final  . Eosinophils Absolute 05/31/2018 0.2  0.0 - 0.5 K/uL Final  . Basophils Relative 05/31/2018 1  % Final  . Basophils Absolute 05/31/2018 0.0  0.0 - 0.1 K/uL Final   Performed at [facility], [address] [place], [place] [address]    (this displays the last labs from the last 3 days)  No results found for: TOTALPROTELP, ALBUMINELP, A1GS, A2GS, BETS,  BETA2SER, GAMS, MSPIKE, SPEI (this displays SPEP labs)  No results found for: KPAFRELGTCHN, LAMBDASER, KAPLAMBRATIO (kappa/lambda light chains)  No results found for: HGBA, HGBA2QUANT, HGBFQUANT, HGBSQUAN (Hemoglobinopathy evaluation)   No results found for: LDH  Lab Results  Component Value Date   IRON 26 (L) 02/08/2007   TIBC 371 02/08/2007   IRONPCTSAT 7 (L) 02/08/2007   (Iron and TIBC)  No results found for: FERRITIN  Urinalysis    Component Value Date/Time   COLORURINE YELLOW 10/21/2014 2347   APPEARANCEUR CLOUDY (A) 10/21/2014 2347   LABSPEC 1.014 10/21/2014 2347   PHURINE 6.0 10/21/2014 2347   GLUCOSEU NEGATIVE 10/21/2014 2347   GLUCOSEU NEG mg/dL 01/23/2008 1418   HGBUR LARGE (A) 10/21/2014 2347   BILIRUBINUR negative 12/29/2017 [place] 10/21/2014 2347   PROTEINUR 2+ 12/29/2017 1046   PROTEINUR 100 (A) 10/21/2014 2347   UROBILINOGEN 0.2 12/29/2017 1046   UROBILINOGEN 0.2 10/21/2014 2347   NITRITE negative 12/29/2017 1046   NITRITE NEGATIVE 10/21/2014 2347   LEUKOCYTESUR Large (3+) (A) 12/29/2017 1046     STUDIES: Mr Breast Bilateral W Wo Contrast Inc Cad  Result Date: 05/23/2018 CLINICAL DATA:  52 year old female with biopsy proven grade 3 invasive ductal carcinoma in the medial right breast. The patient also had abnormal axillary lymphadenopathy bilaterally which was negative for metastatic disease upon biopsy. She is status post remote bilateral breast reduction. Multiple skin boils are also noted in both breasts at the time of her diagnostic workup. LABS:  05/10/2018: BUN 19 Creatinine 1.28 EXAM: BILATERAL BREAST MRI WITH AND WITHOUT CONTRAST TECHNIQUE: Multiplanar, multisequence MR images of both breasts were obtained prior to and following the intravenous administration of 20 ml of MultiHance. THREE-DIMENSIONAL MR IMAGE RENDERING ON INDEPENDENT WORKSTATION: Three-dimensional MR images were rendered by post-processing of the original MR data  on an independent workstation. The three-dimensional MR images were interpreted, and findings are reported in the following complete MRI report for this study. Three dimensional images were evaluated at the independent DynaCad workstation COMPARISON:  Previous exam(s). FINDINGS: Breast composition: a. Almost entirely fat. Background parenchymal enhancement: Mild. Right breast: Signal void from post biopsy tissue marker is seen in association with a 2.6 x 2.5 x 2.4 cm enhancing, irregular mass in central inner aspect of the right breast at middle depth. This is  consistent with the patient's biopsy-proven malignancy. An additional 7 x 6 x 4 mm enhancing mass is seen in the central aspect at middle depth. This is located approximately 5 cm lateral to the index lesion, with similar T1 and T2 imaging characteristics. Skin thickening and enhancement is noted along the right nipple, which is a new finding from prior mammogram from 2016. No additional suspicious mass or non mass enhancement is identified within the right breast. Left breast: No mass or abnormal enhancement within the left breast. Lymph nodes: Morphologically abnormal lymph nodes are identified within the bilateral axilla. They are associated signal voids from post biopsy tissue markers within the most abnormal/largest lymph node on each side. Ancillary findings: Skin thickening and enhancement is identified in several locations along the bilateral breasts. Particularly along the inframammary folds both medially and laterally. This is likely consistent with the patient's history of multiple skin boils. IMPRESSION: 1. 2.6 x 2.5 x 2.4 cm enhancing mass consistent with the patient's known biopsy-proven malignancy in the medial right breast. 2. Indeterminate 7 x 6 x 4 mm enhancing mass in the central right breast at middle depth. This is located approximately 5 cm lateral to the index lesion. Recommendation is for MRI guided biopsy. 3. Indeterminate skin  thickening and enhancement along the right nipple/areola. This is a new finding compared to a mammogram from 2016. Recommendation is for clinical evaluation and punch biopsy. 4. No suspicious MRI findings within the left breast. 5. Multiple foci of skin thickening and enhancement along the bilateral inframammary folds likely represent skin boils as noted at the time of diagnostic workup. Clinical correlation recommended. 6. Morphologically abnormal lymph nodes in the bilateral axilla with associated post biopsy clips. This demonstrated benign pathology bilaterally. RECOMMENDATION: 1. MRI guided biopsy of an indeterminate subcentimeter enhancing mass in the central right breast. 2. Clinical evaluation and punch biopsy of indeterminate skin thickening and enhancement at the right nipple/areola. BI-RADS CATEGORY  4: Suspicious. Electronically Signed   By: Kristopher Oppenheim M.D.   On: 05/23/2018 11:56   Dg Chest Port 1 View  Result Date: 05/17/2018 CLINICAL DATA:  52 year old female status post Port-A-Cath placement. EXAM: PORTABLE CHEST 1 VIEW COMPARISON:  Chest radiographs 10/05/2017. FINDINGS: Portable AP semi upright view at 1309 hours. Left chest subclavian approach porta cath is in place. The catheter courses to the right mediastinum, but the catheter tip appears laterally or perhaps partially posteriorly directed. Lower lung volumes. Perihilar and basilar atelectasis. No pneumothorax. No pleural effusion. Stable mediastinal contours. Visualized tracheal air column is within normal limits. Prior thyroidectomy with small lower neck surgical clips. Negative visible bowel gas pattern. No acute osseous abnormality identified. IMPRESSION: 1. Left chest subclavian approach Port-A-Cath is in place, but the catheter tip appears unusually angulated raising the possibility that it might terminate within the azygos vein. 2. No pneumothorax or other adverse features identified. 3. Atelectasis. Electronically Signed   By: Genevie Ann M.D.   On: 05/17/2018 13:20   Dg Fluoro Guide Cv Line-no Report  Result Date: 05/17/2018 Fluoroscopy was utilized by the requesting physician.  No radiographic interpretation.    ELIGIBLE FOR AVAILABLE RESEARCH PROTOCOL: UPBEAT  ASSESSMENT: 52 y.o. [place], [place] woman status post right breast upper inner quadrant biopsy 04/26/2018 for a clinical T2 N0, stage Ib invasive ductal carcinoma, grade 2, with an MIB-1 of 70%.  (1) genetics testing pending  (2) neoadjuvant chemotherapy will consist of carboplatin, docetaxel, trastuzumab and Pertuzumab given every 21 days x 6, starting 05/31/2018  (  3) trastuzumab to continue to total 6 months  (a) echo on 05/16/2018 shows an EF of 60-65%.  (4) definitive surgery to follow chemotherapy  (5) adjuvant radiation to follow surgery  (6) antiestrogens to start at the completion of local treatment  PLAN: Deslyn is doing well today.  We reviewed her labs that are normal today.  I reviewed her chemotherapy regimen and briefly reviewed possible adverse effects.  We reviewed her anti nausea medications and I went through a "guide map" with how to take these medications.  We reviewed claritin use and potential bone pain following neulasta, and also reviewed how she should take imodium should she experience diarrhea following chemotherapy.  She verbalizes understanding of all of this.    Dr. [name] and I reviewed her chest xray from when she had her port placed on 05/17/18.  There is a question raised by the radiologist that the port is not in the SVC, but in the azygos vein.  Due to this, we cannot give chemotherapy through the port, and will need to have it revised prior to her receiving chemotherapy in it.  However, we can give the chemotherapy peripherally, which is what we will do today.  I reviewed this with Lakayla, and she is disappointed, however understanding of the situation.  Iris Pert our nurse navigator will help communicate with Dr.  Trevor Mace nurse at [facility] to review this.    Chania will return in 1 week for labs and follow up.     Marnie has a good understanding of the overall plan. She agrees with it. She knows the goal of treatment in her case is cure. She will call with any problems that may develop before her next visit here.  A total of (30) minutes of face-to-face time was spent with this patient with greater than 50% of that time in counseling and care-coordination.    Wilber Bihari, NP  05/31/18 12:53 PM Medical Oncology and Hematology [facility] [address] [place],  [address] Tel. [phone]    Fax. [phone]

## 2018-05-31 NOTE — Progress Notes (Signed)
Per Dr. [name], okay to administer chemotherapies today, and administer immunotherapy tomorrow 06/01/2018.

## 2018-05-31 NOTE — Patient Instructions (Signed)
[facility] Discharge Instructions for Patients Receiving Chemotherapy  Today you received the following chemotherapy agents :  Taxotere, Carboplatin.  To help prevent nausea and vomiting after your treatment, we encourage you to take your nausea medication as prescribed.  TAKE COMPAZINE 10 MG EVERY 6 HOURS AS NEEDED FOR NAUSEA.  DO NOT DRIVE AFTER TAKING THIS MEDICATION SINCE IT CAN CAUSE DROWSINESS. TAKE ATIVAN 0.5 MG AT BEDTIME AS NEEDED FOR NAUSEA/VOMITING AND TO HELP WITH SLEEP. TAKE DEXAMETHASONE AS INSTRUCTED BY DR. MAGRINAT.  DO DRINK LOTS OF FLUIDS AS TOLERATED.  DO NOT EAT GREASY NOR SPICY FOODS.     If you develop nausea and vomiting that is not controlled by your nausea medication, call the clinic.   BELOW ARE SYMPTOMS THAT SHOULD BE REPORTED IMMEDIATELY:  *FEVER GREATER THAN 100.5 F  *CHILLS WITH OR WITHOUT FEVER  NAUSEA AND VOMITING THAT IS NOT CONTROLLED WITH YOUR NAUSEA MEDICATION  *UNUSUAL SHORTNESS OF BREATH  *UNUSUAL BRUISING OR BLEEDING  TENDERNESS IN MOUTH AND THROAT WITH OR WITHOUT PRESENCE OF ULCERS  *URINARY PROBLEMS  *BOWEL PROBLEMS  UNUSUAL RASH Items with * indicate a potential emergency and should be followed up as soon as possible.  Feel free to call the clinic should you have any questions or concerns. The clinic phone number is [phone].  Please show the [place] at check-in to the Emergency Department and triage nurse.

## 2018-06-01 ENCOUNTER — Inpatient Hospital Stay: Payer: Managed Care, Other (non HMO)

## 2018-06-01 ENCOUNTER — Other Ambulatory Visit: Payer: Self-pay | Admitting: Oncology

## 2018-06-01 ENCOUNTER — Other Ambulatory Visit: Payer: Self-pay | Admitting: *Deleted

## 2018-06-01 ENCOUNTER — Ambulatory Visit (HOSPITAL_COMMUNITY)
Admission: RE | Admit: 2018-06-01 | Discharge: 2018-06-01 | Disposition: A | Discharge status: Home / Self Care | Payer: Managed Care, Other (non HMO) | Source: Ambulatory Visit | Attending: Oncology | Admitting: Oncology

## 2018-06-01 VITALS — BP 154/74 | HR 61 | Temp 98.7°F | Resp 16

## 2018-06-01 DIAGNOSIS — Z452 Encounter for adjustment and management of vascular access device: Secondary | ICD-10-CM | POA: Insufficient documentation

## 2018-06-01 DIAGNOSIS — C50311 Malignant neoplasm of lower-inner quadrant of right female breast: Secondary | ICD-10-CM | POA: Diagnosis present

## 2018-06-01 DIAGNOSIS — Z170 Estrogen receptor positive status [ER+]: Principal | ICD-10-CM

## 2018-06-01 DIAGNOSIS — Z5111 Encounter for antineoplastic chemotherapy: Secondary | ICD-10-CM | POA: Diagnosis not present

## 2018-06-01 LAB — HLA B*5701: HLA-B 5701 W/RFLX HLA-B HIGH: NEGATIVE

## 2018-06-01 LAB — HIV-1 RNA QUANT-NO REFLEX-BLD
HIV 1 RNA QUANT: NOT DETECTED {copies}/mL
HIV-1 RNA QUANT, LOG: NOT DETECTED {Log_copies}/mL

## 2018-06-01 MED ORDER — DIPHENHYDRAMINE HCL 25 MG PO CAPS
50.0000 mg | ORAL_CAPSULE | Freq: Once | ORAL | Status: AC
  Administered 2018-06-01: 50 mg via ORAL

## 2018-06-01 MED ORDER — DIPHENHYDRAMINE HCL 25 MG PO CAPS
ORAL_CAPSULE | ORAL | Status: AC
  Filled 2018-06-01: qty 2

## 2018-06-01 MED ORDER — MORPHINE SULFATE (PF) 4 MG/ML IV SOLN
INTRAVENOUS | Status: AC
  Filled 2018-06-01: qty 1

## 2018-06-01 MED ORDER — SODIUM CHLORIDE 0.9 % IV SOLN
Freq: Once | INTRAVENOUS | Status: AC
  Administered 2018-06-01: 08:00:00 via INTRAVENOUS
  Filled 2018-06-01: qty 250

## 2018-06-01 MED ORDER — ACETAMINOPHEN 325 MG PO TABS
650.0000 mg | ORAL_TABLET | Freq: Once | ORAL | Status: AC
  Administered 2018-06-01: 650 mg via ORAL

## 2018-06-01 MED ORDER — SODIUM CHLORIDE 0.9 % IV SOLN
840.0000 mg | Freq: Once | INTRAVENOUS | Status: AC
  Administered 2018-06-01: 840 mg via INTRAVENOUS
  Filled 2018-06-01: qty 28

## 2018-06-01 MED ORDER — TRASTUZUMAB CHEMO 150 MG IV SOLR
900.0000 mg | Freq: Once | INTRAVENOUS | Status: AC
  Administered 2018-06-01: 900 mg via INTRAVENOUS
  Filled 2018-06-01: qty 42.86

## 2018-06-01 MED ORDER — ACETAMINOPHEN 325 MG PO TABS
ORAL_TABLET | ORAL | Status: AC
  Filled 2018-06-01: qty 2

## 2018-06-01 MED ORDER — MORPHINE SULFATE 4 MG/ML IJ SOLN
2.0000 mg | Freq: Once | INTRAMUSCULAR | Status: AC
  Administered 2018-06-01: 2 mg via INTRAVENOUS
  Filled 2018-06-01: qty 1

## 2018-06-01 NOTE — Progress Notes (Signed)
Patient c/o intermittent HA since leaving treatment area yesterday. Denies dizziness, visual changes, or weakness. Face symmetrical. No HA at time of arrival this morning; however, during Herceptin infusion, patient began c/o HA. Sandi Mealy, PA-C came to treatment area to evaluate. Orders received, repeated, and confirmed. Medicated per MAR.

## 2018-06-01 NOTE — Patient Instructions (Signed)
[facility] Discharge Instructions for Patients Receiving Chemotherapy  Today you received the following chemotherapy agents:  Herceptin and Perjeta.  To help prevent nausea and vomiting after your treatment, we encourage you to take your nausea medication as directed.   If you develop nausea and vomiting that is not controlled by your nausea medication, call the clinic.   BELOW ARE SYMPTOMS THAT SHOULD BE REPORTED IMMEDIATELY:  *FEVER GREATER THAN 100.5 F  *CHILLS WITH OR WITHOUT FEVER  NAUSEA AND VOMITING THAT IS NOT CONTROLLED WITH YOUR NAUSEA MEDICATION  *UNUSUAL SHORTNESS OF BREATH  *UNUSUAL BRUISING OR BLEEDING  TENDERNESS IN MOUTH AND THROAT WITH OR WITHOUT PRESENCE OF ULCERS  *URINARY PROBLEMS  *BOWEL PROBLEMS  UNUSUAL RASH Items with * indicate a potential emergency and should be followed up as soon as possible.  Feel free to call the clinic should you have any questions or concerns. The clinic phone number is [phone].  Please show the [place] at check-in to the Emergency Department and triage nurse.  Trastuzumab injection for infusion What is this medicine? TRASTUZUMAB (tras TOO zoo mab) is a monoclonal antibody. It is used to treat breast cancer and stomach cancer. This medicine may be used for other purposes; ask your health care provider or pharmacist if you have questions. COMMON BRAND NAME(S): Herceptin What should I tell my health care provider before I take this medicine? They need to know if you have any of these conditions: -heart disease -heart failure -lung or breathing disease, like asthma -an unusual or allergic reaction to trastuzumab, benzyl alcohol, or other medications, foods, dyes, or preservatives -pregnant or trying to get pregnant -breast-feeding How should I use this medicine? This drug is given as an infusion into a vein. It is administered in a hospital or clinic by a specially trained health care  professional. Talk to your pediatrician regarding the use of this medicine in children. This medicine is not approved for use in children. Overdosage: If you think you have taken too much of this medicine contact a poison control center or emergency room at once. NOTE: This medicine is only for you. Do not share this medicine with others. What if I miss a dose? It is important not to miss a dose. Call your doctor or health care professional if you are unable to keep an appointment. What may interact with this medicine? This medicine may interact with the following medications: -certain types of chemotherapy, such as daunorubicin, doxorubicin, epirubicin, and idarubicin This list may not describe all possible interactions. Give your health care provider a list of all the medicines, herbs, non-prescription drugs, or dietary supplements you use. Also tell them if you smoke, drink alcohol, or use illegal drugs. Some items may interact with your medicine. What should I watch for while using this medicine? Visit your doctor for checks on your progress. Report any side effects. Continue your course of treatment even though you feel ill unless your doctor tells you to stop. Call your doctor or health care professional for advice if you get a fever, chills or sore throat, or other symptoms of a cold or flu. Do not treat yourself. Try to avoid being around people who are sick. You may experience fever, chills and shaking during your first infusion. These effects are usually mild and can be treated with other medicines. Report any side effects during the infusion to your health care professional. Fever and chills usually do not happen with later infusions. Do not  become pregnant while taking this medicine or for 7 months after stopping it. Women should inform their doctor if they wish to become pregnant or think they might be pregnant. Women of child-bearing potential will need to have a negative pregnancy test  before starting this medicine. There is a potential for serious side effects to an unborn child. Talk to your health care professional or pharmacist for more information. Do not breast-feed an infant while taking this medicine or for 7 months after stopping it. Women must use effective birth control with this medicine. What side effects may I notice from receiving this medicine? Side effects that you should report to your doctor or health care professional as soon as possible: -allergic reactions like skin rash, itching or hives, swelling of the face, lips, or tongue -chest pain or palpitations -cough -dizziness -feeling faint or lightheaded, falls -fever -general ill feeling or flu-like symptoms -signs of worsening heart failure like breathing problems; swelling in your legs and feet -unusually weak or tired Side effects that usually do not require medical attention (report to your doctor or health care professional if they continue or are bothersome): -bone pain -changes in taste -diarrhea -joint pain -nausea/vomiting -weight loss This list may not describe all possible side effects. Call your doctor for medical advice about side effects. You may report side effects to FDA at 1-800-FDA-1088. Where should I keep my medicine? This drug is given in a hospital or clinic and will not be stored at home. NOTE: This sheet is a summary. It may not cover all possible information. If you have questions about this medicine, talk to your doctor, pharmacist, or health care provider.  2018 Elsevier/Gold Standard (2016-09-27 14:37:52)  Pertuzumab injection What is this medicine? PERTUZUMAB (per TOOZ ue mab) is a monoclonal antibody. It is used to treat breast cancer. This medicine may be used for other purposes; ask your health care provider or pharmacist if you have questions. COMMON BRAND NAME(S): PERJETA What should I tell my health care provider before I take this medicine? They need to know  if you have any of these conditions: -heart disease -heart failure -high blood pressure -history of irregular heart beat -recent or ongoing radiation therapy -an unusual or allergic reaction to pertuzumab, other medicines, foods, dyes, or preservatives -pregnant or trying to get pregnant -breast-feeding How should I use this medicine? This medicine is for infusion into a vein. It is given by a health care professional in a hospital or clinic setting. Talk to your pediatrician regarding the use of this medicine in children. Special care may be needed. Overdosage: If you think you have taken too much of this medicine contact a poison control center or emergency room at once. NOTE: This medicine is only for you. Do not share this medicine with others. What if I miss a dose? It is important not to miss your dose. Call your doctor or health care professional if you are unable to keep an appointment. What may interact with this medicine? Interactions are not expected. Give your health care provider a list of all the medicines, herbs, non-prescription drugs, or dietary supplements you use. Also tell them if you smoke, drink alcohol, or use illegal drugs. Some items may interact with your medicine. This list may not describe all possible interactions. Give your health care provider a list of all the medicines, herbs, non-prescription drugs, or dietary supplements you use. Also tell them if you smoke, drink alcohol, or use illegal drugs. Some items may interact  with your medicine. What should I watch for while using this medicine? Your condition will be monitored carefully while you are receiving this medicine. Report any side effects. Continue your course of treatment even though you feel ill unless your doctor tells you to stop. Do not become pregnant while taking this medicine or for 7 months after stopping it. Women should inform their doctor if they wish to become pregnant or think they might be  pregnant. Women of child-bearing potential will need to have a negative pregnancy test before starting this medicine. There is a potential for serious side effects to an unborn child. Talk to your health care professional or pharmacist for more information. Do not breast-feed an infant while taking this medicine or for 7 months after stopping it. Women must use effective birth control with this medicine. Call your doctor or health care professional for advice if you get a fever, chills or sore throat, or other symptoms of a cold or flu. Do not treat yourself. Try to avoid being around people who are sick. You may experience fever, chills, and headache during the infusion. Report any side effects during the infusion to your health care professional. What side effects may I notice from receiving this medicine? Side effects that you should report to your doctor or health care professional as soon as possible: -breathing problems -chest pain or palpitations -dizziness -feeling faint or lightheaded -fever or chills -skin rash, itching or hives -sore throat -swelling of the face, lips, or tongue -swelling of the legs or ankles -unusually weak or tired Side effects that usually do not require medical attention (report to your doctor or health care professional if they continue or are bothersome): -diarrhea -hair loss -nausea, vomiting -tiredness This list may not describe all possible side effects. Call your doctor for medical advice about side effects. You may report side effects to FDA at 1-800-FDA-1088. Where should I keep my medicine? This drug is given in a hospital or clinic and will not be stored at home. NOTE: This sheet is a summary. It may not cover all possible information. If you have questions about this medicine, talk to your doctor, pharmacist, or health care provider.  2018 Elsevier/Gold Standard (2015-11-05 12:08:50)

## 2018-06-02 ENCOUNTER — Inpatient Hospital Stay: Payer: Managed Care, Other (non HMO)

## 2018-06-02 VITALS — BP 140/80 | HR 81 | Temp 98.7°F | Resp 18

## 2018-06-02 DIAGNOSIS — Z5111 Encounter for antineoplastic chemotherapy: Secondary | ICD-10-CM | POA: Diagnosis not present

## 2018-06-02 DIAGNOSIS — C50311 Malignant neoplasm of lower-inner quadrant of right female breast: Secondary | ICD-10-CM

## 2018-06-02 DIAGNOSIS — Z170 Estrogen receptor positive status [ER+]: Principal | ICD-10-CM

## 2018-06-02 MED ORDER — PEGFILGRASTIM-CBQV 6 MG/0.6ML ~~LOC~~ SOSY
6.0000 mg | PREFILLED_SYRINGE | Freq: Once | SUBCUTANEOUS | Status: AC
  Administered 2018-06-02: 6 mg via SUBCUTANEOUS

## 2018-06-02 MED ORDER — PEGFILGRASTIM-CBQV 6 MG/0.6ML ~~LOC~~ SOSY
PREFILLED_SYRINGE | SUBCUTANEOUS | Status: AC
  Filled 2018-06-02: qty 0.6

## 2018-06-02 NOTE — Patient Instructions (Signed)
Pegfilgrastim injection What is this medicine? PEGFILGRASTIM (PEG fil gra stim) is a long-acting granulocyte colony-stimulating factor that stimulates the growth of neutrophils, a type of white blood cell important in the body's fight against infection. It is used to reduce the incidence of fever and infection in patients with certain types of cancer who are receiving chemotherapy that affects the bone marrow, and to increase survival after being exposed to high doses of radiation. This medicine may be used for other purposes; ask your health care provider or pharmacist if you have questions. COMMON BRAND NAME(S): Neulasta What should I tell my health care provider before I take this medicine? They need to know if you have any of these conditions: -kidney disease -latex allergy -ongoing radiation therapy -sickle cell disease -skin reactions to acrylic adhesives (On-Body Injector only) -an unusual or allergic reaction to pegfilgrastim, filgrastim, other medicines, foods, dyes, or preservatives -pregnant or trying to get pregnant -breast-feeding How should I use this medicine? This medicine is for injection under the skin. If you get this medicine at home, you will be taught how to prepare and give the pre-filled syringe or how to use the On-body Injector. Refer to the patient Instructions for Use for detailed instructions. Use exactly as directed. Tell your healthcare provider immediately if you suspect that the On-body Injector may not have performed as intended or if you suspect the use of the On-body Injector resulted in a missed or partial dose. It is important that you put your used needles and syringes in a special sharps container. Do not put them in a trash can. If you do not have a sharps container, call your pharmacist or healthcare provider to get one. Talk to your pediatrician regarding the use of this medicine in children. While this drug may be prescribed for selected conditions,  precautions do apply. Overdosage: If you think you have taken too much of this medicine contact a poison control center or emergency room at once. NOTE: This medicine is only for you. Do not share this medicine with others. What if I miss a dose? It is important not to miss your dose. Call your doctor or health care professional if you miss your dose. If you miss a dose due to an On-body Injector failure or leakage, a new dose should be administered as soon as possible using a single prefilled syringe for manual use. What may interact with this medicine? Interactions have not been studied. Give your health care provider a list of all the medicines, herbs, non-prescription drugs, or dietary supplements you use. Also tell them if you smoke, drink alcohol, or use illegal drugs. Some items may interact with your medicine. This list may not describe all possible interactions. Give your health care provider a list of all the medicines, herbs, non-prescription drugs, or dietary supplements you use. Also tell them if you smoke, drink alcohol, or use illegal drugs. Some items may interact with your medicine. What should I watch for while using this medicine? You may need blood work done while you are taking this medicine. If you are going to need a MRI, CT scan, or other procedure, tell your doctor that you are using this medicine (On-Body Injector only). What side effects may I notice from receiving this medicine? Side effects that you should report to your doctor or health care professional as soon as possible: -allergic reactions like skin rash, itching or hives, swelling of the face, lips, or tongue -dizziness -fever -pain, redness, or irritation at site   where injected -pinpoint red spots on the skin -red or dark-brown urine -shortness of breath or breathing problems -stomach or side pain, or pain at the shoulder -swelling -tiredness -trouble passing urine or change in the amount of urine Side  effects that usually do not require medical attention (report to your doctor or health care professional if they continue or are bothersome): -bone pain -muscle pain This list may not describe all possible side effects. Call your doctor for medical advice about side effects. You may report side effects to FDA at 1-800-FDA-1088. Where should I keep my medicine? Keep out of the reach of children. Store pre-filled syringes in a refrigerator between 2 and 8 degrees C (36 and 46 degrees F). Do not freeze. Keep in carton to protect from light. Throw away this medicine if it is left out of the refrigerator for more than 48 hours. Throw away any unused medicine after the expiration date. NOTE: This sheet is a summary. It may not cover all possible information. If you have questions about this medicine, talk to your doctor, pharmacist, or health care provider.  2018 Elsevier/Gold Standard (2016-09-29 12:58:03)  

## 2018-06-03 ENCOUNTER — Other Ambulatory Visit: Payer: Self-pay | Admitting: Primary Care

## 2018-06-03 DIAGNOSIS — E119 Type 2 diabetes mellitus without complications: Secondary | ICD-10-CM

## 2018-06-04 ENCOUNTER — Other Ambulatory Visit: Payer: Self-pay | Admitting: Oncology

## 2018-06-05 ENCOUNTER — Other Ambulatory Visit: Payer: Self-pay | Admitting: Internal Medicine

## 2018-06-05 NOTE — Progress Notes (Signed)
FMLA successfully faxed to [employer] at [phone]. Mailed copy to patient address on file.

## 2018-06-06 ENCOUNTER — Encounter: Payer: Self-pay | Admitting: Adult Health

## 2018-06-06 ENCOUNTER — Telehealth: Payer: Self-pay | Admitting: Oncology

## 2018-06-06 ENCOUNTER — Inpatient Hospital Stay: Payer: Managed Care, Other (non HMO)

## 2018-06-06 ENCOUNTER — Other Ambulatory Visit: Payer: Self-pay | Admitting: Internal Medicine

## 2018-06-06 ENCOUNTER — Inpatient Hospital Stay (HOSPITAL_BASED_OUTPATIENT_CLINIC_OR_DEPARTMENT_OTHER): Payer: Managed Care, Other (non HMO) | Admitting: Adult Health

## 2018-06-06 VITALS — BP 162/93 | HR 70 | Temp 98.8°F | Resp 16 | Ht 65.0 in | Wt 239.0 lb

## 2018-06-06 DIAGNOSIS — C50211 Malignant neoplasm of upper-inner quadrant of right female breast: Secondary | ICD-10-CM

## 2018-06-06 DIAGNOSIS — Z85850 Personal history of malignant neoplasm of thyroid: Secondary | ICD-10-CM | POA: Diagnosis not present

## 2018-06-06 DIAGNOSIS — Z803 Family history of malignant neoplasm of breast: Secondary | ICD-10-CM

## 2018-06-06 DIAGNOSIS — R197 Diarrhea, unspecified: Secondary | ICD-10-CM | POA: Diagnosis not present

## 2018-06-06 DIAGNOSIS — Z5111 Encounter for antineoplastic chemotherapy: Secondary | ICD-10-CM | POA: Diagnosis not present

## 2018-06-06 DIAGNOSIS — M542 Cervicalgia: Secondary | ICD-10-CM

## 2018-06-06 DIAGNOSIS — R252 Cramp and spasm: Secondary | ICD-10-CM

## 2018-06-06 DIAGNOSIS — C73 Malignant neoplasm of thyroid gland: Secondary | ICD-10-CM

## 2018-06-06 DIAGNOSIS — Z170 Estrogen receptor positive status [ER+]: Secondary | ICD-10-CM

## 2018-06-06 DIAGNOSIS — K219 Gastro-esophageal reflux disease without esophagitis: Secondary | ICD-10-CM

## 2018-06-06 DIAGNOSIS — Z800 Family history of malignant neoplasm of digestive organs: Secondary | ICD-10-CM

## 2018-06-06 DIAGNOSIS — Z801 Family history of malignant neoplasm of trachea, bronchus and lung: Secondary | ICD-10-CM

## 2018-06-06 DIAGNOSIS — C50311 Malignant neoplasm of lower-inner quadrant of right female breast: Secondary | ICD-10-CM

## 2018-06-06 LAB — COMPREHENSIVE METABOLIC PANEL
ALBUMIN: 3.3 g/dL — AB (ref 3.5–5.0)
ALK PHOS: 145 U/L — AB (ref 38–126)
ALT: 48 U/L — ABNORMAL HIGH (ref 0–44)
ANION GAP: 7 (ref 5–15)
AST: 38 U/L (ref 15–41)
BUN: 19 mg/dL (ref 6–20)
CALCIUM: 8.9 mg/dL (ref 8.9–10.3)
CHLORIDE: 108 mmol/L (ref 98–111)
CO2: 26 mmol/L (ref 22–32)
Creatinine, Ser: 1.34 mg/dL — ABNORMAL HIGH (ref 0.44–1.00)
GFR calc Af Amer: 53 mL/min — ABNORMAL LOW (ref >60)
GFR calc non Af Amer: 45 mL/min — ABNORMAL LOW (ref >60)
GLUCOSE: 77 mg/dL (ref 70–99)
Potassium: 3.7 mmol/L (ref 3.5–5.1)
SODIUM: 141 mmol/L (ref 135–145)
Total Bilirubin: 0.4 mg/dL (ref 0.3–1.2)
Total Protein: 7.4 g/dL (ref 6.5–8.1)

## 2018-06-06 LAB — CBC WITH DIFFERENTIAL/PLATELET
Basophils Absolute: 0.1 10*3/uL (ref 0.0–0.1)
Basophils Relative: 1 %
Eosinophils Absolute: 0.1 10*3/uL (ref 0.0–0.5)
Eosinophils Relative: 1 %
HEMATOCRIT: 32.7 % — AB (ref 34.8–46.6)
Hemoglobin: 10.8 g/dL — ABNORMAL LOW (ref 11.6–15.9)
LYMPHS ABS: 3 10*3/uL (ref 0.9–3.3)
LYMPHS PCT: 26 %
MCH: 27.4 pg (ref 25.1–34.0)
MCHC: 32.9 g/dL (ref 31.5–36.0)
MCV: 83.5 fL (ref 79.5–101.0)
MONO ABS: 0.9 10*3/uL (ref 0.1–0.9)
MONOS PCT: 7 %
NEUTROS ABS: 7.6 10*3/uL — AB (ref 1.5–6.5)
Neutrophils Relative %: 65 %
Platelets: 302 10*3/uL (ref 145–400)
RBC: 3.92 MIL/uL (ref 3.70–5.45)
RDW: 18.3 % — AB (ref 11.2–14.5)
WBC: 11.6 10*3/uL — ABNORMAL HIGH (ref 3.9–10.3)

## 2018-06-06 MED ORDER — ONDANSETRON HCL 4 MG/2ML IJ SOLN
INTRAMUSCULAR | Status: AC
  Filled 2018-06-06: qty 4

## 2018-06-06 MED ORDER — OXYCODONE-ACETAMINOPHEN 5-325 MG PO TABS
ORAL_TABLET | ORAL | Status: AC
  Filled 2018-06-06: qty 1

## 2018-06-06 MED ORDER — SODIUM CHLORIDE 0.9 % IV SOLN
INTRAVENOUS | Status: DC
  Administered 2018-06-06: 16:00:00 via INTRAVENOUS
  Filled 2018-06-06 (×2): qty 250

## 2018-06-06 MED ORDER — TRAMADOL HCL 50 MG PO TABS: 50.0000 mg | ORAL_TABLET | Freq: Four times a day (QID) | ORAL | 0 refills | Status: DC | PRN

## 2018-06-06 MED ORDER — SODIUM CHLORIDE 0.9 % IV SOLN: Freq: Once | INTRAVENOUS | Status: DC

## 2018-06-06 MED ORDER — ONDANSETRON HCL 4 MG/2ML IJ SOLN
8.0000 mg | Freq: Once | INTRAMUSCULAR | Status: AC
  Administered 2018-06-06: 8 mg via INTRAVENOUS

## 2018-06-06 MED ORDER — OXYCODONE-ACETAMINOPHEN 5-325 MG PO TABS
1.0000 | ORAL_TABLET | Freq: Once | ORAL | Status: AC
  Administered 2018-06-06: 1 via ORAL

## 2018-06-06 NOTE — Telephone Encounter (Signed)
Pt scheduled per 8/21 sch message

## 2018-06-06 NOTE — Progress Notes (Signed)
[facility]  [phone] [phone]     ID: [name] DOB: [birth year]  MR#: [id number]  LGX#:[id number]  Patient Care Team: [name], NP as PCP - General (Internal Medicine) Michel Bickers, MD as PCP - Infectious Diseases (Infectious Diseases) Magrinat, Virgie Dad, MD as Consulting Physician (Oncology) Kyung Rudd, MD as Consulting Physician (Radiation Oncology) Coralie Keens, MD as Consulting Physician (General Surgery) OTHER MD:   CHIEF COMPLAINT: Triple positive breast cancer  CURRENT TREATMENT: Neoadjuvant chemotherapy   HISTORY OF CURRENT ILLNESS: [name] (pronounced "Huff") palpated a mass in the right breast for about 2 weeks before bringing it to medical attention. She underwent bilateral diagnostic mammography with tomography and right breast ultrasonography at [facility] on 04/20/2018 showing: breast density category B. There was a highly suspicious mass located at 3 o'clock in the upper inner quadrant measuring 2.9 x 2.5 x 1.6 cm and 4 cm from the nipple. Ultrasound of the right axilla showed 5 lymph nodes with abnormal cortical thickening. The left axilla showed 3 lymph nodes with abnormal cortical thickening.  Accordingly on 04/26/2018 she proceeded to biopsy of the right breast area in question. The pathology from this procedure showed ([id number]): Invasive ductal carcinoma, grade II with calcifications. One right axillary lymph node and one left axillary lymph node were negative for carcinoma. Prognostic indicators significant for: estrogen receptor, 100% positive and progesterone receptor, 80% positive, both with strong staining intensity. Proliferation marker Ki67 at 70%. HER2 amplified with ratios HER2/CEP17 signals 4.52 and average HER2 copies per cell 10.85  The patient's subsequent history is as detailed below.  INTERVAL HISTORY: [name] is here today for follow up and evaluation of her HER-2 positive  breast cancer.  She is here after receiving chemotherapy with Docetaxel, Carboplatin, Trastuzumab and Pertuzumab last week.  She is not feeling well today.    REVIEW OF SYSTEMS: [name] has been having issues with diarrhea.  She says this started a few days following her treatment.  She experienced about 8-10 bowel movements per day.  She also had some reflux which improved with Pepcid.  She has taken one imodium for her diarrhea.  She does not feel like she has been able to drink enough fluids to keep up with it.  [name] has also noted some lower back pain and spasms that have caused her distress.    [name] is doing well otherwise.  She hasn't had fevers, chills, mucositis, headaches, peripheral neuropathy, vomiting, or any other concerns.  A detailed ROS is otherwise non contributory.     PAST MEDICAL HISTORY: Past Medical History:  Diagnosis Date  . Anemia    Normocytic  . Anxiety   . Asthma   . Blood dyscrasia   . Bronchitis 2005  . 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 Diabetes mellitus without complication ([place])   . Gastroenteritis 07/10/07  . GERD 07/24/2006  . GRAVE'S DISEASE 01/01/2008  . History of hidradenitis suppurativa   . History of kidney stones   . History of thrush   . HIV DISEASE 07/24/2006   dx March 05  . HYPERTENSION 07/24/2006  . Hyperthyroidism 08/2006   Grave's Disease -diffuse radiotracer uptake 08/25/06 Thyroid scan-Cold nodule to R lower lobe of thyrorid  . Menometrorrhagia    hx of  . Nephrolithiasis   . 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 Pneumonia 2005  . 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 Sarcoidosis 02/08/2007   dx as  a teenager in [place] from abnl CXR. Completed 2 yrs Prednisone after lung bx confirmation. No symptoms since then.  . Thyroid cancer ([place])   . THYROID NODULE, RIGHT 02/08/2007  -2018 thyroid nodules were precancerous but pathology was negative for thyroid  cancer.   PAST SURGICAL HISTORY: Past Surgical History:  Procedure Laterality Date  . BREAST SURGERY  1997   Breast Reduction   . CYSTOSCOPY W/ URETERAL STENT REMOVAL  11/09/2012   Procedure: CYSTOSCOPY WITH STENT REMOVAL;  Surgeon: Alexis Frock, MD;  Location: [facility];  Service: Urology;  Laterality: Right;  . CYSTOSCOPY WITH RETROGRADE PYELOGRAM, URETEROSCOPY AND STENT PLACEMENT  11/09/2012   Procedure: CYSTOSCOPY WITH RETROGRADE PYELOGRAM, URETEROSCOPY AND STENT PLACEMENT;  Surgeon: Alexis Frock, MD;  Location: [facility];  Service: Urology;  Laterality: Left;  LEFT URETEROSCOPY, STONE MANIPULATION, left STENT exchange   . CYSTOSCOPY WITH STENT PLACEMENT  10/02/2012   Procedure: CYSTOSCOPY WITH STENT PLACEMENT;  Surgeon: Alexis Frock, MD;  Location: [facility];  Service: Urology;  Laterality: Left;  . DILATION AND CURETTAGE OF UTERUS  Feb 2004   s/p for 1st trimester nonviable pregnancy  . EYE SURGERY     sty under eyelid  . INCISE AND DRAIN ABCESS  Nov 03   s/p I &D for righ inframmary fold hidradenitis  . INCISION AND DRAINAGE PERITONSILLAR ABSCESS  Mar 03  . IRRIGATION AND DEBRIDEMENT ABSCESS  01/31/2012   Procedure: IRRIGATION AND DEBRIDEMENT ABSCESS;  Surgeon: Shann Medal, MD;  Location: [facility];  Service: General;  Laterality: Right;  right breast and axilla   . NEPHROLITHOTOMY  10/02/2012   Procedure: NEPHROLITHOTOMY PERCUTANEOUS;  Surgeon: Alexis Frock, MD;  Location: [facility];  Service: Urology;  Laterality: Right;  First Stage Percutaneous Nephrolithotomy with Surgeon Access, Left Ureteral Stent    . NEPHROLITHOTOMY  10/04/2012   Procedure: NEPHROLITHOTOMY PERCUTANEOUS SECOND LOOK;  Surgeon: Alexis Frock, MD;  [facility];  Service: Urology;  Laterality: Right;     . NEPHROLITHOTOMY  10/08/2012   Procedure: NEPHROLITHOTOMY PERCUTANEOUS;  Surgeon: Alexis Frock, MD;  Location: [facility];  Service: Urology;  Laterality: Right;  THIRD STAGE, nephrostomy tube exchange x 2  .  NEPHROLITHOTOMY  10/11/2012   Procedure: NEPHROLITHOTOMY PERCUTANEOUS SECOND LOOK;  Surgeon: Alexis Frock, MD;  Location[facility];  Service: Urology;  Laterality: Right;  RIGHT 4 STAGE PERCUTANOUS NEPHROLITHOTOMY, right URETEROSCOPY WITH HOLMIUM LASER   . PORTACATH PLACEMENT Left 05/17/2018   Procedure: INSERTION PORT-A-CATH;  Surgeon: Coralie Keens, MD;  Location: [facility];  Service: General;  Laterality: Left;  . RADICAL NECK DISSECTION  04/12/2017   limited/notes 04/12/2017  . RADICAL NECK DISSECTION N/A 04/12/2017   Procedure: RADICAL NECK DISSECTION;  Surgeon: Melida Quitter, MD;  Location: [facility];  Service: ENT;  Laterality: N/A;  limited neck dissection 2 hours total  . REDUCTION MAMMAPLASTY Bilateral 1998  . [place]  1994  . THYROIDECTOMY  04/12/2017   completion/notes 04/12/2017  . THYROIDECTOMY N/A 04/12/2017   Procedure: THYROIDECTOMY;  Surgeon: Melida Quitter, MD;  Location: [facility];  Service: ENT;  Laterality: N/A;  Completion Thyroidectomy  . TOTAL THYROIDECTOMY  2010  Thyroid nodules removed- pathology at the ENT doctor 2018 were benign -Over active thyroid and thyroidectomy with benign pathology  FAMILY HISTORY Family History  Problem Relation Age of Onset  . Hypertension Mother   . Cancer Mother        throat  . Heart disease Mother        stent  .  Hypertension Father   . Cancer Other        FH of Breast Cancer  . Cancer Other   . Heart disease Other   . Hypertension Other   . Stroke Other        Grandparent  . Kidney disease Other        Grandparent  . Diabetes Other        FH of Diabetes  . Hypertension Sister   . Cancer Maternal Uncle        Stomach Cancer, Lung CA  . Hypertension Brother   . Hypertension Sister   . Breast cancer Maternal Aunt   . Breast cancer Paternal Aunt 3  The patient's mother is alive at age 22. The patient's father died at 75 from lung cancer (heavy smoker). She does not know much about her father. The patient has 1  brother and 2 sisters. The patient's mother had a history of laryngeal cancer. There was a maternal cousin with breast cancer diagnosed at age 57. There were 2 maternal aunts with breast cancer, the youngest being diagnosed in the 6's. There was a maternal great aunt with breast cancer. There were 2 paternal aunts with breast cancer.    GYNECOLOGIC HISTORY:  Patient's last menstrual period was 03/31/2014. Menarche: 52 years old Age at first live birth: 52 years old She is GX P1.  Her LMP was in 2017. She did not use HRT.    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 At home is the patient's daughter, [name], age 25, who is a Engineer, technical sales education and working part-time at the [employer]. The patient attends [employer].     ADVANCED DIRECTIVES: Not in place; at the 05/10/2018 visit the patient was given the appropriate documents to complete and notarized at her discretion   HEALTH MAINTENANCE: Social History   Tobacco Use  . Smoking status: Former Smoker    Packs/day: 0.50    Years: 15.00    Pack years: 7.50    Types: Cigarettes    Start date: 04/12/2017  . Smokeless tobacco: Never Used  Substance Use Topics  . Alcohol use: Yes    Alcohol/week: 0.0 standard drinks    Comment: social  . Drug use: No     Colonoscopy: no  PAP: [facility]. 2018  Bone density: remote   Allergies  Allergen Reactions  . Tape Rash    Rash   . Genvoya [Elviteg-Cobic-Emtricit-Tenofaf] Hives  . Lisinopril Cough  . Xylocaine [Lidocaine]     Unknown , patient states she has had lidocaine for IV starts and at the dentist with no problems  . Valsartan Cough    Pt states she tolerates medicine now    Current Outpatient Medications  Medication Sig Dispense Refill  . amLODipine (NORVASC) 10 MG tablet Take 1 tablet (10 mg total) by mouth daily. 90 tablet 0  . atorvastatin (LIPITOR) 20 MG tablet Take 1 tablet by mouth every evening for  high cholesterol. NEED APPOINTMENT FOR ANY MORE REFILLS 30 tablet 0  . dexamethasone (DECADRON) 4 MG tablet Take 2 tablets (8 mg total) by mouth 2 (two) times daily. Start the day before Taxotere. Take once the day after, then 2 times a day x 2d. 30 tablet 1  . dolutegravir ([name]) 50 MG tablet Take 1 tablet (50 mg total) by mouth daily. 30 tablet 11  . doxycycline (VIBRA-TABS) 100 MG tablet Take 1 tablet (100 mg total) by  mouth 2 (two) times daily. 60 tablet 5  . emtricitabine-tenofovir AF (DESCOVY) 200-25 MG tablet Take 1 tablet by mouth daily. 30 tablet 11  . glucose blood (ONETOUCH VERIO) test strip Use as instructed to test blood sugar daily 100 each 5  . hydrALAZINE (APRESOLINE) 25 MG tablet TAKE 1 TABLET BY MOUTH THREE TIMES A DAY 270 tablet 1  . irbesartan-hydrochlorothiazide (AVALIDE) 150-12.5 MG tablet TAKE 1 TABLET BY MOUTH ONCE DAILY FOR BLOOD PRESSURE. 90 tablet 1  . levothyroxine (SYNTHROID, LEVOTHROID) 175 MCG tablet TAKE 1 TABLET BY MOUTH EVERY MORNING ON AN EMPTY STOMACH WITH A FULL GLASS OF WATER. 90 tablet 0  . lidocaine-prilocaine (EMLA) cream Apply to affected area once 30 g 3  . LORazepam (ATIVAN) 0.5 MG tablet Take 1 tablet (0.5 mg total) by mouth at bedtime as needed (Nausea or vomiting). 20 tablet 0  . metFORMIN (GLUCOPHAGE) 500 MG tablet TAKE 1 TABLET BY MOUTH TWICE A DAY WITH A MEAL (DUE FOR APPT FOR ADDITIONAL REFILLS) 60 tablet 0  . metoprolol succinate (TOPROL-XL) 50 MG 24 hr tablet TAKE 1 TABLET (50 MG TOTAL) BY MOUTH DAILY. TAKE WITH OR IMMEDIATELY FOLLOWING A MEAL. 90 tablet 1  . mometasone (NASONEX) 50 MCG/ACT nasal spray PLACE 2 SPRAYS INTO THE NOSE DAILY. 17 g 3  . Multiple Vitamin (MULTIVITAMIN) tablet Take 1 tablet by mouth daily.    . ONE TOUCH LANCETS MISC Use as instructed to test blood sugar daily 200 each 5  . oxyCODONE (OXY IR/ROXICODONE) 5 MG immediate release tablet Take 1 tablet (5 mg total) by mouth every 6 (six) hours as needed for moderate pain or  severe pain. 20 tablet 0  . pantoprazole (PROTONIX) 40 MG tablet Take 40 mg by mouth daily as needed (for indigestion/acid reflux.).     Marland Kitchen PROAIR RESPICLICK 295 (90 Base) MCG/ACT AEPB INHALE 2 PUFFS INTO THE LUNGS EVERY 6 HOURS AS NEEDED FOR WHEEZING OR SHORTNESS OF BREATH. 1 each 2  . prochlorperazine (COMPAZINE) 10 MG tablet Take 1 tablet (10 mg total) by mouth every 6 (six) hours as needed (Nausea or vomiting). 30 tablet 1   No current facility-administered medications for this visit.     OBJECTIVE: Vitals:   06/06/18 1530  BP: (!) 162/93  Pulse: 70  Resp: 16  Temp: 98.8 F (37.1 C)  SpO2: 100%     Body mass index is 39.77 kg/m.   Wt Readings from Last 3 Encounters:  06/06/18 239 lb (108.4 kg)  05/31/18 246 lb 1.6 oz (111.6 kg)  05/28/18 244 lb 12.8 oz (111 kg)  ECOG FS:1 - Symptomatic but completely ambulatory GENERAL: Patient is a well appearing female in no acute distress HEENT:  Sclerae anicteric.  Oropharynx clear and moist. No ulcerations or evidence of oropharyngeal candidiasis. Neck is supple.  NODES:  No cervical, supraclavicular, or axillary lymphadenopathy palpated.  BREAST EXAM:  Deferred. LUNGS:  Clear to auscultation bilaterally.  No wheezes or rhonchi. HEART:  Regular rate and rhythm. No murmur appreciated. ABDOMEN:  Soft, nontender.  Positive, normoactive bowel sounds. No organomegaly palpated. MSK:  No focal spinal tenderness to palpation. Full range of motion bilaterally in the upper extremities. EXTREMITIES:  No peripheral edema.   SKIN:  Clear with no obvious rashes or skin changes. No nail dyscrasia. NEURO:  Nonfocal. Well oriented.  Appropriate affect.     LAB RESULTS:  CMP     Component Value Date/Time   NA 142 05/31/2018 0850   K 3.8 05/31/2018 0850  CL 105 05/31/2018 0850   CO2 27 05/31/2018 0850   GLUCOSE 167 (H) 05/31/2018 0850   BUN 19 05/31/2018 0850   CREATININE 1.30 (H) 05/31/2018 0850   CREATININE 1.28 (H) 05/10/2018 1458    CREATININE 1.02 09/23/2015 1047   CALCIUM 9.3 05/31/2018 0850   PROT 7.6 05/31/2018 0850   ALBUMIN 3.3 (L) 05/31/2018 0850   AST 14 (L) 05/31/2018 0850   AST 28 05/10/2018 1458   ALT 13 05/31/2018 0850   ALT 23 05/10/2018 1458   ALKPHOS 107 05/31/2018 0850   BILITOT <0.2 (L) 05/31/2018 0850   BILITOT <0.2 (L) 05/10/2018 1458   GFRNONAA 47 (L) 05/31/2018 0850   GFRNONAA 48 (L) 05/10/2018 1458   GFRAA 54 (L) 05/31/2018 0850   GFRAA 56 (L) 05/10/2018 1458    No results found for: TOTALPROTELP, ALBUMINELP, A1GS, A2GS, BETS, BETA2SER, GAMS, MSPIKE, SPEI  No results found for: KPAFRELGTCHN, LAMBDASER, KAPLAMBRATIO  Lab Results  Component Value Date   WBC 11.6 (H) 06/06/2018   NEUTROABS 7.6 (H) 06/06/2018   HGB 10.8 (L) 06/06/2018   HCT 32.7 (L) 06/06/2018   MCV 83.5 06/06/2018   PLT 302 06/06/2018    _0 @  No results found for: LABCA2  No components found for: [id number]  No results for input(s): INR in the last 168 hours.  No results found for: LABCA2  No results found for: ZHG992  No results found for: EQA834  No results found for: HDQ222  No results found for: CA2729  No components found for: HGQUANT  No results found for: CEA1 / No results found for: CEA1   No results found for: AFPTUMOR  No results found for: CHROMOGRNA  No results found for: PSA1  Appointment on 06/06/2018  Component Date Value Ref Range Status  . WBC 06/06/2018 11.6* 3.9 - 10.3 K/uL Final  . RBC 06/06/2018 3.92  3.70 - 5.45 MIL/uL Final  . Hemoglobin 06/06/2018 10.8* 11.6 - 15.9 g/dL Final  . HCT 06/06/2018 32.7* 34.8 - 46.6 % Final  . MCV 06/06/2018 83.5  79.5 - 101.0 fL Final  . MCH 06/06/2018 27.4  25.1 - 34.0 pg Final  . MCHC 06/06/2018 32.9  31.5 - 36.0 g/dL Final  . RDW 06/06/2018 18.3* 11.2 - 14.5 % Final  . Platelets 06/06/2018 302  145 - 400 K/uL Final  . Neutrophils Relative % 06/06/2018 65  % Final  . Neutro Abs 06/06/2018 7.6* 1.5 - 6.5 K/uL Final  .  Lymphocytes Relative 06/06/2018 26  % Final  . Lymphs Abs 06/06/2018 3.0  0.9 - 3.3 K/uL Final  . Monocytes Relative 06/06/2018 7  % Final  . Monocytes Absolute 06/06/2018 0.9  0.1 - 0.9 K/uL Final  . Eosinophils Relative 06/06/2018 1  % Final  . Eosinophils Absolute 06/06/2018 0.1  0.0 - 0.5 K/uL Final  . Basophils Relative 06/06/2018 1  % Final  . Basophils Absolute 06/06/2018 0.1  0.0 - 0.1 K/uL Final   Performed at [facility], [place] [address] [place], [place] [address]    (this displays the last labs from the last 3 days)  No results found for: TOTALPROTELP, ALBUMINELP, A1GS, A2GS, BETS, BETA2SER, GAMS, MSPIKE, SPEI (this displays SPEP labs)  No results found for: KPAFRELGTCHN, LAMBDASER, KAPLAMBRATIO (kappa/lambda light chains)  No results found for: HGBA, HGBA2QUANT, HGBFQUANT, HGBSQUAN (Hemoglobinopathy evaluation)   No results found for: LDH  Lab Results  Component Value Date   IRON 26 (L) 02/08/2007   TIBC 371  02/08/2007   IRONPCTSAT 7 (L) 02/08/2007   (Iron and TIBC)  No results found for: FERRITIN  Urinalysis    Component Value Date/Time   COLORURINE YELLOW 10/21/2014 2347   APPEARANCEUR CLOUDY (A) 10/21/2014 2347   LABSPEC 1.014 10/21/2014 2347   PHURINE 6.0 10/21/2014 2347   GLUCOSEU NEGATIVE 10/21/2014 2347   GLUCOSEU NEG mg/dL 01/23/2008 1418   HGBUR LARGE (A) 10/21/2014 2347   BILIRUBINUR negative 12/29/2017 1046   KETONESUR NEGATIVE 10/21/2014 2347   PROTEINUR 2+ 12/29/2017 1046   PROTEINUR 100 (A) 10/21/2014 2347   UROBILINOGEN 0.2 12/29/2017 1046   UROBILINOGEN 0.2 10/21/2014 2347   NITRITE negative 12/29/2017 1046   NITRITE NEGATIVE 10/21/2014 2347   LEUKOCYTESUR Large (3+) (A) 12/29/2017 1046     STUDIES: Dg Chest 1 View  Result Date: 06/02/2018 CLINICAL DATA:  Check port placement EXAM: CHEST  1 VIEW COMPARISON:  05/17/2018 FINDINGS: Cardiac shadow is stable. Left chest wall port is noted with catheter tip at  the junction of the innominate veins relatively stable from the prior exam. Lungs are well aerated bilaterally. No focal infiltrate or sizable effusion is seen. IMPRESSION: Left-sided chest wall port as described short of its expected location at the cavoatrial junction. No other focal abnormality is noted. Electronically Signed   By: Inez Catalina M.D.   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 The patient also had abnormal axillary lymphadenopathy bilaterally which was negative for metastatic disease upon biopsy. She is status post remote bilateral breast reduction. Multiple skin boils are also noted in both breasts at the time of her diagnostic workup. LABS:  05/10/2018: BUN 19 Creatinine 1.28 EXAM: BILATERAL BREAST MRI WITH AND WITHOUT CONTRAST TECHNIQUE: Multiplanar, multisequence MR images of both breasts were obtained prior to and following the intravenous administration of 20 ml of MultiHance. THREE-DIMENSIONAL MR IMAGE RENDERING ON INDEPENDENT WORKSTATION: Three-dimensional MR images were rendered by post-processing of the original MR data on an independent workstation. The three-dimensional MR images were interpreted, and findings are reported in the following complete MRI report for this study. Three dimensional images were evaluated at the independent DynaCad workstation COMPARISON:  Previous exam(s). FINDINGS: Breast composition: a. Almost entirely fat. Background parenchymal enhancement: Mild. Right breast: Signal void from post biopsy tissue marker is seen in association with a 2.6 x 2.5 x 2.4 cm enhancing, irregular mass in central inner aspect of the right breast at middle depth. This is consistent with the patient's biopsy-proven malignancy. An additional 7 x 6 x 4 mm enhancing mass is seen in the central aspect at middle depth. This  is located approximately 5 cm lateral to the index lesion, with similar T1 and T2 imaging characteristics. Skin thickening and enhancement is noted along the right nipple, which is a new finding from prior mammogram from 2016. No additional suspicious mass or non mass enhancement is identified within the right breast. Left breast: No mass or abnormal enhancement within the left breast. Lymph nodes: Morphologically abnormal lymph nodes are identified within the bilateral axilla. They are associated signal voids from post biopsy tissue markers within the most abnormal/largest lymph node on each side. Ancillary findings: Skin thickening and enhancement is identified in several locations along the bilateral breasts. Particularly along the inframammary folds both medially and laterally. This is likely consistent with the patient's history of multiple skin  boils. IMPRESSION: 1. 2.6 x 2.5 x 2.4 cm enhancing mass consistent with the patient's known biopsy-proven malignancy in the medial right breast. 2. Indeterminate 7 x 6 x 4 mm enhancing mass in the central right breast at middle depth. This is located approximately 5 cm lateral to the index lesion. Recommendation is for MRI guided biopsy. 3. Indeterminate skin thickening and enhancement along the right nipple/areola. This is a new finding compared to a mammogram from 2016. Recommendation is for clinical evaluation and punch biopsy. 4. No suspicious MRI findings within the left breast. 5. Multiple foci of skin thickening and enhancement along the bilateral inframammary folds likely represent skin boils as noted at the time of diagnostic workup. Clinical correlation recommended. 6. Morphologically abnormal lymph nodes in the bilateral axilla with associated post biopsy clips. This demonstrated benign pathology bilaterally. RECOMMENDATION: 1. MRI guided biopsy of an indeterminate subcentimeter enhancing mass in the central right breast. 2. Clinical evaluation and punch  biopsy of indeterminate skin thickening and enhancement at the right nipple/areola. BI-RADS CATEGORY  4: Suspicious. Electronically Signed   By: Kristopher Oppenheim M.D.   On: 05/23/2018 11:56   Dg Chest Port 1 View  Result Date: 05/17/2018 CLINICAL DATA:  52 year old female status post Port-A-Cath placement. EXAM: PORTABLE CHEST 1 VIEW COMPARISON:  Chest radiographs 10/05/2017. FINDINGS: Portable AP semi upright view at 1309 hours. Left chest subclavian approach porta cath is in place. The catheter courses to the right mediastinum, but the catheter tip appears laterally or perhaps partially posteriorly directed. Lower lung volumes. Perihilar and basilar atelectasis. No pneumothorax. No pleural effusion. Stable mediastinal contours. Visualized tracheal air column is within normal limits. Prior thyroidectomy with small lower neck surgical clips. Negative visible bowel gas pattern. No acute osseous abnormality identified. IMPRESSION: 1. Left chest subclavian approach Port-A-Cath is in place, but the catheter tip appears unusually angulated raising the possibility that it might terminate within the azygos vein. 2. No pneumothorax or other adverse features identified. 3. Atelectasis. Electronically Signed   By: Genevie Ann M.D.   On: 05/17/2018 13:20   Dg Fluoro Guide Cv Line-no Report  Result Date: 05/17/2018 Fluoroscopy was utilized by the requesting physician.  No radiographic interpretation.    ELIGIBLE FOR AVAILABLE RESEARCH PROTOCOL: UPBEAT  ASSESSMENT: 52 y.o. [place], [place] woman status post right breast upper inner quadrant biopsy 04/26/2018 for a clinical T2 N0, stage Ib invasive ductal carcinoma, grade 2, with an MIB-1 of 70%.  (1) genetics testing pending  (2) neoadjuvant chemotherapy will consist of carboplatin, docetaxel, trastuzumab and Pertuzumab given every 21 days x 6, starting 05/31/2018  (3) trastuzumab to continue to total 6 months  (a) echo on 05/16/2018 shows an EF of 60-65%.  (4)  definitive surgery to follow chemotherapy  (5) adjuvant radiation to follow surgery  (6) antiestrogens to start at the completion of local treatment  PLAN: Mariaceleste is doing moderately well today.  Unfortunately, she has had a rough time with diarrhea.  This is likely from the pertuzumab.  She will receive one liter of IV fluids today.  I also will give her one tablet of percocet to see if that helps with her back pain.  I reviewed her port placement with Dr. [name], Dr. Ninfa Linden, and nursing administration/CNS Cline Crock.  She will undergo a port dye study to verify where exactly her catheter tip is located and ensure it is in the right location per our chemotherapy administration protocol.  If it is not, then it will have to be  revised.  She will receive IV fluids tomorrow as well if needed.    [name] will return in 2 week for labs and follow up, along with her next treatment.    Emanii has a good understanding of the overall plan. She agrees with it. She knows the goal of treatment in her case is cure. She will call with any problems that may develop before her next visit here.  A total of (30) minutes of face-to-face time was spent with this patient with greater than 50% of that time in counseling and care-coordination.    Wilber Bihari, NP  06/06/18 3:34 PM Medical Oncology and Hematology [facility] [address] [place], [place] [address] Tel. [phone]    Fax. [phone]

## 2018-06-06 NOTE — Patient Instructions (Signed)
Dehydration, Adult Dehydration is a condition in which there is not enough fluid or water in the body. This happens when you lose more fluids than you take in. Important organs, such as the kidneys, brain, and heart, cannot function without a proper amount of fluids. Any loss of fluids from the body can lead to dehydration. Dehydration can range from mild to severe. This condition should be treated right away to prevent it from becoming severe. What are the causes? This condition may be caused by:  Vomiting.  Diarrhea.  Excessive sweating, such as from heat exposure or exercise.  Not drinking enough fluid, especially: ? When ill. ? While doing activity that requires a lot of energy.  Excessive urination.  Fever.  Infection.  Certain medicines, such as medicines that cause the body to lose excess fluid (diuretics).  Inability to access safe drinking water.  Reduced physical ability to get adequate water and food.  What increases the risk? This condition is more likely to develop in people:  Who have a poorly controlled long-term (chronic) illness, such as diabetes, heart disease, or kidney disease.  Who are age 65 or older.  Who are disabled.  Who live in a place with high altitude.  Who play endurance sports.  What are the signs or symptoms? Symptoms of mild dehydration may include:  Thirst.  Dry lips.  Slightly dry mouth.  Dry, warm skin.  Dizziness. Symptoms of moderate dehydration may include:  Very dry mouth.  Muscle cramps.  Dark urine. Urine may be the color of tea.  Decreased urine production.  Decreased tear production.  Heartbeat that is irregular or faster than normal (palpitations).  Headache.  Light-headedness, especially when you stand up from a sitting position.  Fainting (syncope). Symptoms of severe dehydration may include:  Changes in skin, such as: ? Cold and clammy skin. ? Blotchy (mottled) or pale skin. ? Skin that does  not quickly return to normal after being lightly pinched and released (poor skin turgor).  Changes in body fluids, such as: ? Extreme thirst. ? No tear production. ? Inability to sweat when body temperature is high, such as in hot weather. ? Very little urine production.  Changes in vital signs, such as: ? Weak pulse. ? Pulse that is more than 100 beats a minute when sitting still. ? Rapid breathing. ? Low blood pressure.  Other changes, such as: ? Sunken eyes. ? Cold hands and feet. ? Confusion. ? Lack of energy (lethargy). ? Difficulty waking up from sleep. ? Short-term weight loss. ? Unconsciousness. How is this diagnosed? This condition is diagnosed based on your symptoms and a physical exam. Blood and urine tests may be done to help confirm the diagnosis. How is this treated? Treatment for this condition depends on the severity. Mild or moderate dehydration can often be treated at home. Treatment should be started right away. Do not wait until dehydration becomes severe. Severe dehydration is an emergency and it needs to be treated in a hospital. Treatment for mild dehydration may include:  Drinking more fluids.  Replacing salts and minerals in your blood (electrolytes) that you may have lost. Treatment for moderate dehydration may include:  Drinking an oral rehydration solution (ORS). This is a drink that helps you replace fluids and electrolytes (rehydrate). It can be found at pharmacies and retail stores. Treatment for severe dehydration may include:  Receiving fluids through an IV tube.  Receiving an electrolyte solution through a feeding tube that is passed through your nose   and into your stomach (nasogastric tube, or NG tube).  Correcting any abnormalities in electrolytes.  Treating the underlying cause of dehydration. Follow these instructions at home:  If directed by your health care provider, drink an ORS: ? Make an ORS by following instructions on the  package. ? Start by drinking small amounts, about  cup (120 mL) every 5-10 minutes. ? Slowly increase how much you drink until you have taken the amount recommended by your health care provider.  Drink enough clear fluid to keep your urine clear or pale yellow. If you were told to drink an ORS, finish the ORS first, then start slowly drinking other clear fluids. Drink fluids such as: ? Water. Do not drink only water. Doing that can lead to having too little salt (sodium) in the body (hyponatremia). ? Ice chips. ? Fruit juice that you have added water to (diluted fruit juice). ? Low-calorie sports drinks.  Avoid: ? Alcohol. ? Drinks that contain a lot of sugar. These include high-calorie sports drinks, fruit juice that is not diluted, and soda. ? Caffeine. ? Foods that are greasy or contain a lot of fat or sugar.  Take over-the-counter and prescription medicines only as told by your health care provider.  Do not take sodium tablets. This can lead to having too much sodium in the body (hypernatremia).  Eat foods that contain a healthy balance of electrolytes, such as bananas, oranges, potatoes, tomatoes, and spinach.  Keep all follow-up visits as told by your health care provider. This is important. Contact a health care provider if:  You have abdominal pain that: ? Gets worse. ? Stays in one area (localizes).  You have a rash.  You have a stiff neck.  You are more irritable than usual.  You are sleepier or more difficult to wake up than usual.  You feel weak or dizzy.  You feel very thirsty.  You have urinated only a small amount of very dark urine over 6-8 hours. Get help right away if:  You have symptoms of severe dehydration.  You cannot drink fluids without vomiting.  Your symptoms get worse with treatment.  You have a fever.  You have a severe headache.  You have vomiting or diarrhea that: ? Gets worse. ? Does not go away.  You have blood or green matter  (bile) in your vomit.  You have blood in your stool. This may cause stool to look black and tarry.  You have not urinated in 6-8 hours.  You faint.  Your heart rate while sitting still is over 100 beats a minute.  You have trouble breathing. This information is not intended to replace advice given to you by your health care provider. Make sure you discuss any questions you have with your health care provider. Document Released: 10/03/2005 Document Revised: 04/29/2016 Document Reviewed: 11/27/2015 Elsevier Interactive Patient Education  2018 [employer]  

## 2018-06-07 ENCOUNTER — Inpatient Hospital Stay (HOSPITAL_BASED_OUTPATIENT_CLINIC_OR_DEPARTMENT_OTHER): Payer: Managed Care, Other (non HMO) | Admitting: *Deleted

## 2018-06-07 ENCOUNTER — Inpatient Hospital Stay: Payer: Managed Care, Other (non HMO) | Admitting: Genetics

## 2018-06-07 ENCOUNTER — Ambulatory Visit (HOSPITAL_COMMUNITY)
Admission: RE | Admit: 2018-06-07 | Discharge: 2018-06-07 | Disposition: A | Discharge status: Home / Self Care | Payer: Managed Care, Other (non HMO) | Source: Ambulatory Visit | Attending: Adult Health | Admitting: Adult Health

## 2018-06-07 ENCOUNTER — Encounter (HOSPITAL_COMMUNITY): Payer: Self-pay | Admitting: Interventional Radiology

## 2018-06-07 ENCOUNTER — Telehealth: Payer: Self-pay | Admitting: Oncology

## 2018-06-07 ENCOUNTER — Inpatient Hospital Stay: Payer: Managed Care, Other (non HMO)

## 2018-06-07 DIAGNOSIS — Y713 Surgical instruments, materials and cardiovascular devices (including sutures) associated with adverse incidents: Secondary | ICD-10-CM | POA: Insufficient documentation

## 2018-06-07 DIAGNOSIS — Z170 Estrogen receptor positive status [ER+]: Secondary | ICD-10-CM

## 2018-06-07 DIAGNOSIS — T82514A Breakdown (mechanical) of infusion catheter, initial encounter: Secondary | ICD-10-CM | POA: Diagnosis not present

## 2018-06-07 DIAGNOSIS — Z5111 Encounter for antineoplastic chemotherapy: Secondary | ICD-10-CM | POA: Diagnosis not present

## 2018-06-07 DIAGNOSIS — C50311 Malignant neoplasm of lower-inner quadrant of right female breast: Secondary | ICD-10-CM

## 2018-06-07 HISTORY — PX: IR CV LINE INJECTION: IMG2294

## 2018-06-07 MED ORDER — SODIUM CHLORIDE 0.9 % IV SOLN
INTRAVENOUS | Status: DC
  Administered 2018-06-07: 11:00:00 via INTRAVENOUS
  Filled 2018-06-07 (×2): qty 250

## 2018-06-07 MED ORDER — HEPARIN SOD (PORK) LOCK FLUSH 100 UNIT/ML IV SOLN
500.0000 [IU] | Freq: Once | INTRAVENOUS | Status: AC
  Administered 2018-06-07: 500 [IU] via INTRAVENOUS
  Filled 2018-06-07: qty 5

## 2018-06-07 MED ORDER — IOPAMIDOL (ISOVUE-300) INJECTION 61%
50.0000 mL | Freq: Once | INTRAVENOUS | Status: AC | PRN
  Administered 2018-06-07: 10 mL via INTRAVENOUS

## 2018-06-07 MED ORDER — IOPAMIDOL (ISOVUE-300) INJECTION 61%
INTRAVENOUS | Status: AC
  Administered 2018-06-07: 10 mL via INTRAVENOUS
  Filled 2018-06-07: qty 50

## 2018-06-07 MED ORDER — SODIUM CHLORIDE 0.9% FLUSH
10.0000 mL | Freq: Once | INTRAVENOUS | Status: AC
  Administered 2018-06-07: 10 mL
  Filled 2018-06-07: qty 10

## 2018-06-07 NOTE — Telephone Encounter (Signed)
Spoke with patient regarding appt added on 9/5 for Dr. [name].  Asked patient to come in for 8:30.   ([place] per [facility] for 8:30 am).  Patient needed to cancel today's gen coun appt and that was rescheduled.

## 2018-06-07 NOTE — Progress Notes (Signed)
Pt care transferred to [facility].  Report given.

## 2018-06-08 ENCOUNTER — Telehealth: Payer: Self-pay | Admitting: Adult Health

## 2018-06-08 NOTE — Telephone Encounter (Signed)
Per 8/21 los, no los orders.

## 2018-06-11 ENCOUNTER — Other Ambulatory Visit: Payer: Self-pay | Admitting: *Deleted

## 2018-06-12 ENCOUNTER — Other Ambulatory Visit: Payer: Self-pay | Admitting: Internal Medicine

## 2018-06-12 DIAGNOSIS — B20 Human immunodeficiency virus [HIV] disease: Secondary | ICD-10-CM

## 2018-06-13 ENCOUNTER — Other Ambulatory Visit: Payer: Self-pay | Admitting: Surgery

## 2018-06-13 ENCOUNTER — Telehealth: Payer: Self-pay

## 2018-06-13 ENCOUNTER — Telehealth: Payer: Self-pay | Admitting: *Deleted

## 2018-06-13 ENCOUNTER — Telehealth: Payer: Self-pay | Admitting: Adult Health

## 2018-06-13 ENCOUNTER — Other Ambulatory Visit: Payer: Self-pay

## 2018-06-13 DIAGNOSIS — C50311 Malignant neoplasm of lower-inner quadrant of right female breast: Secondary | ICD-10-CM

## 2018-06-13 DIAGNOSIS — Z170 Estrogen receptor positive status [ER+]: Principal | ICD-10-CM

## 2018-06-13 NOTE — Telephone Encounter (Signed)
Lori from [facility] called to advise the patient states that her boils have gotten worse since starting Chemo and her provider there would like to ask if Dr [name] has another suggestion as to what antibiotic may work better. The patient is taking Doxy 100 mg 2x daily and it is not helping. Advised will let the provider know and give her a call back if he has another suggestion. Offered the doctors pager number but she declined.

## 2018-06-13 NOTE — Telephone Encounter (Signed)
Spoke with [name] about her needing a port revision, based on Dr. Virgie Dad recommendation.  After discussion, she would like to have the port revised by IR, or by a different surgeon (other than Dr. Ninfa Linden)  She notes that she has an increase in boils, she is taking doxycycline BID and has boils despite this.  She wants to know if she should or could start taking a different antibiotic.  I let her know that since she is seeing ID for her skin, we will run any medication change by them. Cecille Rubin, my LPN will call Dr. Hale Bogus office to review.    Patient verbalizes understanding.  Message routed to [facility], RN--breast navigator.  Wilber Bihari, NP

## 2018-06-13 NOTE — Telephone Encounter (Signed)
Per Dr. [name] and Lindsey,NP pt to be scheduled for port revision for future infusion. Called [facility] and spoke with Trinh and scheduled pt for port revision for 06/20/18 at 10am arrival time. NPO after midnight, and to hold metformin medication, day of surgery. Pt to also be accompanied by a driver. Pt aware of appointment and confirmed time/dates.

## 2018-06-13 NOTE — Telephone Encounter (Signed)
Unfortunately, we do not have any microbiology data to help guide antibiotic selection.  I would suggest switching from doxycycline to amoxicillin clavulanate 875 mg twice daily for 7 to 10 days.  If the boils are large and painful I would suggest that she return to see her surgeon, Dr. Ninfa Linden for consideration of incision and drainage.  If she does need surgery it would be best to submit specimens for Gram stain, aerobic and anaerobic culture.

## 2018-06-19 ENCOUNTER — Other Ambulatory Visit: Payer: Self-pay | Admitting: Radiology

## 2018-06-20 ENCOUNTER — Encounter (HOSPITAL_COMMUNITY): Payer: Self-pay | Admitting: Interventional Radiology

## 2018-06-20 ENCOUNTER — Other Ambulatory Visit: Payer: Self-pay | Admitting: Oncology

## 2018-06-20 ENCOUNTER — Other Ambulatory Visit: Payer: Self-pay

## 2018-06-20 ENCOUNTER — Ambulatory Visit (HOSPITAL_COMMUNITY)
Admission: RE | Admit: 2018-06-20 | Discharge: 2018-06-20 | Disposition: A | Discharge status: Home / Self Care | Payer: Managed Care, Other (non HMO) | Source: Ambulatory Visit | Attending: Oncology | Admitting: Oncology

## 2018-06-20 DIAGNOSIS — Z170 Estrogen receptor positive status [ER+]: Secondary | ICD-10-CM | POA: Diagnosis not present

## 2018-06-20 DIAGNOSIS — Z85850 Personal history of malignant neoplasm of thyroid: Secondary | ICD-10-CM | POA: Insufficient documentation

## 2018-06-20 DIAGNOSIS — Z87891 Personal history of nicotine dependence: Secondary | ICD-10-CM | POA: Diagnosis not present

## 2018-06-20 DIAGNOSIS — Z79890 Hormone replacement therapy (postmenopausal): Secondary | ICD-10-CM | POA: Diagnosis not present

## 2018-06-20 DIAGNOSIS — Z808 Family history of malignant neoplasm of other organs or systems: Secondary | ICD-10-CM | POA: Insufficient documentation

## 2018-06-20 DIAGNOSIS — Z7984 Long term (current) use of oral hypoglycemic drugs: Secondary | ICD-10-CM | POA: Diagnosis not present

## 2018-06-20 DIAGNOSIS — Z803 Family history of malignant neoplasm of breast: Secondary | ICD-10-CM | POA: Diagnosis not present

## 2018-06-20 DIAGNOSIS — Z960 Presence of urogenital implants: Secondary | ICD-10-CM | POA: Diagnosis not present

## 2018-06-20 DIAGNOSIS — E0500 Thyrotoxicosis with diffuse goiter without thyrotoxic crisis or storm: Secondary | ICD-10-CM | POA: Diagnosis not present

## 2018-06-20 DIAGNOSIS — K219 Gastro-esophageal reflux disease without esophagitis: Secondary | ICD-10-CM | POA: Diagnosis not present

## 2018-06-20 DIAGNOSIS — Z79899 Other long term (current) drug therapy: Secondary | ICD-10-CM | POA: Insufficient documentation

## 2018-06-20 DIAGNOSIS — Z809 Family history of malignant neoplasm, unspecified: Secondary | ICD-10-CM | POA: Diagnosis not present

## 2018-06-20 DIAGNOSIS — D649 Anemia, unspecified: Secondary | ICD-10-CM | POA: Diagnosis not present

## 2018-06-20 DIAGNOSIS — C50311 Malignant neoplasm of lower-inner quadrant of right female breast: Secondary | ICD-10-CM

## 2018-06-20 DIAGNOSIS — Y838 Other surgical procedures as the cause of abnormal reaction of the patient, or of later complication, without mention of misadventure at the time of the procedure: Secondary | ICD-10-CM | POA: Diagnosis not present

## 2018-06-20 DIAGNOSIS — Z8249 Family history of ischemic heart disease and other diseases of the circulatory system: Secondary | ICD-10-CM | POA: Diagnosis not present

## 2018-06-20 DIAGNOSIS — T85618A Breakdown (mechanical) of other specified internal prosthetic devices, implants and grafts, initial encounter: Secondary | ICD-10-CM | POA: Insufficient documentation

## 2018-06-20 DIAGNOSIS — Z87442 Personal history of urinary calculi: Secondary | ICD-10-CM | POA: Diagnosis not present

## 2018-06-20 DIAGNOSIS — Z833 Family history of diabetes mellitus: Secondary | ICD-10-CM | POA: Insufficient documentation

## 2018-06-20 DIAGNOSIS — Z888 Allergy status to other drugs, medicaments and biological substances status: Secondary | ICD-10-CM | POA: Diagnosis not present

## 2018-06-20 DIAGNOSIS — Z800 Family history of malignant neoplasm of digestive organs: Secondary | ICD-10-CM | POA: Insufficient documentation

## 2018-06-20 DIAGNOSIS — Z823 Family history of stroke: Secondary | ICD-10-CM | POA: Insufficient documentation

## 2018-06-20 DIAGNOSIS — E119 Type 2 diabetes mellitus without complications: Secondary | ICD-10-CM | POA: Diagnosis not present

## 2018-06-20 DIAGNOSIS — I10 Essential (primary) hypertension: Secondary | ICD-10-CM | POA: Insufficient documentation

## 2018-06-20 DIAGNOSIS — J45909 Unspecified asthma, uncomplicated: Secondary | ICD-10-CM | POA: Insufficient documentation

## 2018-06-20 DIAGNOSIS — Z9889 Other specified postprocedural states: Secondary | ICD-10-CM | POA: Diagnosis not present

## 2018-06-20 DIAGNOSIS — B20 Human immunodeficiency virus [HIV] disease: Secondary | ICD-10-CM | POA: Insufficient documentation

## 2018-06-20 HISTORY — PX: IR REMOVAL TUN ACCESS W/ PORT W/O FL MOD SED: IMG2290

## 2018-06-20 HISTORY — PX: IR IMAGING GUIDED PORT INSERTION: IMG5740

## 2018-06-20 LAB — BASIC METABOLIC PANEL
Anion gap: 9 (ref 5–15)
BUN: 18 mg/dL (ref 6–20)
CALCIUM: 9 mg/dL (ref 8.9–10.3)
CO2: 23 mmol/L (ref 22–32)
Chloride: 110 mmol/L (ref 98–111)
Creatinine, Ser: 1.58 mg/dL — ABNORMAL HIGH (ref 0.44–1.00)
GFR calc Af Amer: 43 mL/min — ABNORMAL LOW (ref >60)
GFR, EST NON AFRICAN AMERICAN: 37 mL/min — AB (ref >60)
Glucose, Bld: 112 mg/dL — ABNORMAL HIGH (ref 70–99)
POTASSIUM: 3.7 mmol/L (ref 3.5–5.1)
SODIUM: 142 mmol/L (ref 135–145)

## 2018-06-20 LAB — CBC
HEMATOCRIT: 32.9 % — AB (ref 36.0–46.0)
Hemoglobin: 10.4 g/dL — ABNORMAL LOW (ref 12.0–15.0)
MCH: 27.6 pg (ref 26.0–34.0)
MCHC: 31.6 g/dL (ref 30.0–36.0)
MCV: 87.3 fL (ref 78.0–100.0)
Platelets: 418 10*3/uL — ABNORMAL HIGH (ref 150–400)
RBC: 3.77 MIL/uL — ABNORMAL LOW (ref 3.87–5.11)
RDW: 18.7 % — AB (ref 11.5–15.5)
WBC: 9.9 10*3/uL (ref 4.0–10.5)

## 2018-06-20 LAB — GLUCOSE, CAPILLARY: Glucose-Capillary: 122 mg/dL — ABNORMAL HIGH (ref 70–99)

## 2018-06-20 LAB — PROTIME-INR
INR: 1.08
Prothrombin Time: 13.9 seconds (ref 11.4–15.2)

## 2018-06-20 MED ORDER — CEFAZOLIN SODIUM-DEXTROSE 2-4 GM/100ML-% IV SOLN
INTRAVENOUS | Status: AC
  Filled 2018-06-20: qty 100

## 2018-06-20 MED ORDER — HEPARIN SOD (PORK) LOCK FLUSH 100 UNIT/ML IV SOLN
INTRAVENOUS | Status: AC
  Filled 2018-06-20: qty 5

## 2018-06-20 MED ORDER — SODIUM CHLORIDE 0.9 % IV SOLN: INTRAVENOUS | Status: DC

## 2018-06-20 MED ORDER — TETRACAINE HCL 1 % IJ SOLN
100.0000 mg | Freq: Once | INTRAMUSCULAR | Status: DC
  Filled 2018-06-20 (×2): qty 10

## 2018-06-20 MED ORDER — MIDAZOLAM HCL 2 MG/2ML IJ SOLN
INTRAMUSCULAR | Status: AC | PRN
  Administered 2018-06-20 (×2): 1 mg via INTRAVENOUS

## 2018-06-20 MED ORDER — LIDOCAINE-EPINEPHRINE 1 %-1:100000 IJ SOLN
INTRAMUSCULAR | Status: AC | PRN
  Administered 2018-06-20: 15 mL

## 2018-06-20 MED ORDER — FENTANYL CITRATE (PF) 100 MCG/2ML IJ SOLN
INTRAMUSCULAR | Status: AC
  Filled 2018-06-20: qty 2

## 2018-06-20 MED ORDER — MIDAZOLAM HCL 2 MG/2ML IJ SOLN
INTRAMUSCULAR | Status: AC
  Filled 2018-06-20: qty 2

## 2018-06-20 MED ORDER — CEFAZOLIN SODIUM-DEXTROSE 2-4 GM/100ML-% IV SOLN
2.0000 g | INTRAVENOUS | Status: AC
  Administered 2018-06-20: 2 g via INTRAVENOUS

## 2018-06-20 MED ORDER — FENTANYL CITRATE (PF) 100 MCG/2ML IJ SOLN
INTRAMUSCULAR | Status: AC | PRN
  Administered 2018-06-20: 50 ug via INTRAVENOUS
  Administered 2018-06-20 (×2): 25 ug via INTRAVENOUS

## 2018-06-20 MED ORDER — LIDOCAINE-EPINEPHRINE (PF) 1 %-1:200000 IJ SOLN
INTRAMUSCULAR | Status: AC
  Filled 2018-06-20: qty 30

## 2018-06-20 NOTE — Progress Notes (Signed)
[facility]  [phone] [phone]     ID: MCKENLEE MANGHAM DOB: [birth year]  MR#: [id number]  KDT#:[id number]  Patient Care Team: [name], NP as PCP - General (Internal Medicine) Michel Bickers, MD as PCP - Infectious Diseases (Infectious Diseases) Axten Pascucci, Virgie Dad, MD as Consulting Physician (Oncology) Kyung Rudd, MD as Consulting Physician (Radiation Oncology) Coralie Keens, MD as Consulting Physician (General Surgery) OTHER MD:   CHIEF COMPLAINT: Triple positive breast cancer  CURRENT TREATMENT: Neoadjuvant chemotherapy  INTERVAL HISTORY: [name] returns today for follow up and treatment of her triple positive breast cancer, accompanied by her sister, [name]. She receives docetaxel, carboplatin, trastuzumab, and pertuzumab every 21 days, with today being day 1 cycle 2. She also receives [place] on day 3 of every cycle. She tolerated cycle 1 generally well, other than issues with diarrhea.   We had problems with her catheter, which apparently was just a little bit short and could not be used by our nursing staff to give chemo.  We reviewed this with Dr. Ninfa Linden her surgeon and he did not have availability so yesterday interventional radiology remove the prior catheter and placed a new port which is ready for use.   REVIEW OF SYSTEMS: Emslee reports that after her port placement yesterday, she took oxycodone for pain, but she has not taken anything today. She denies having constipation. She has skin boils in the right axilla and breast as well has the right side of her face. She is not allergic to any antibiotics to her knowledge. For [employer], she worked. She denies unusual headaches, visual changes, nausea, vomiting, or dizziness. There has been no unusual cough, phlegm production, or pleurisy. There has been no change in bowel or bladder habits. She denies unexplained fatigue or unexplained weight loss, bleeding, rash, or fever. A  detailed review of systems was otherwise stable.     HISTORY OF CURRENT ILLNESS: From the original intake note:  [name] (pronounced "Huff") palpated a mass in the right breast for about 2 weeks before bringing it to medical attention. She underwent bilateral diagnostic mammography with tomography and right breast ultrasonography at [facility] on 04/20/2018 showing: breast density category B. There was a highly suspicious mass located at 3 o'clock in the upper inner quadrant measuring 2.9 x 2.5 x 1.6 cm and 4 cm from the nipple. Ultrasound of the right axilla showed 5 lymph nodes with abnormal cortical thickening. The left axilla showed 3 lymph nodes with abnormal cortical thickening.  Accordingly on 04/26/2018 she proceeded to biopsy of the right breast area in question. The pathology from this procedure showed ([id number]): Invasive ductal carcinoma, grade II with calcifications. One right axillary lymph node and one left axillary lymph node were negative for carcinoma. Prognostic indicators significant for: estrogen receptor, 100% positive and progesterone receptor, 80% positive, both with strong staining intensity. Proliferation marker Ki67 at 70%. HER2 amplified with ratios HER2/CEP17 signals 4.52 and average HER2 copies per cell 10.85  The patient's subsequent history is as detailed below.    PAST MEDICAL HISTORY: Past Medical History:  Diagnosis Date  . Anemia    Normocytic  . Anxiety   . Asthma   . Blood dyscrasia   . Bronchitis 2005  . 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 Diabetes mellitus without complication ([place])   . Gastroenteritis 07/10/07  . GERD 07/24/2006  . GRAVE'S DISEASE 01/01/2008  . History of hidradenitis suppurativa   . History of kidney stones   .  History of thrush   . HIV DISEASE 07/24/2006   dx March 05  . HYPERTENSION 07/24/2006  . Hyperthyroidism 08/2006   Grave's Disease -diffuse radiotracer uptake 08/25/06 Thyroid scan-Cold nodule  to R lower lobe of thyrorid  . Menometrorrhagia    hx of  . Nephrolithiasis   . 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 Pneumonia 2005  . 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 Sarcoidosis 02/08/2007   dx as a teenager in [place] from abnl CXR. Completed 2 yrs Prednisone after lung bx confirmation. No symptoms since then.  . Thyroid cancer ([place])   . THYROID NODULE, RIGHT 02/08/2007  -2018 thyroid nodules were precancerous but pathology was negative for thyroid cancer.   PAST SURGICAL HISTORY: Past Surgical History:  Procedure Laterality Date  . BREAST SURGERY  1997   Breast Reduction   . CYSTOSCOPY W/ URETERAL STENT REMOVAL  11/09/2012   Procedure: CYSTOSCOPY WITH STENT REMOVAL;  Surgeon: Alexis Frock, MD;  Location: [facility];  Service: Urology;  Laterality: Right;  . CYSTOSCOPY WITH RETROGRADE PYELOGRAM, URETEROSCOPY AND STENT PLACEMENT  11/09/2012   Procedure: CYSTOSCOPY WITH RETROGRADE PYELOGRAM, URETEROSCOPY AND STENT PLACEMENT;  Surgeon: Alexis Frock, MD;  Location: [facility];  Service: Urology;  Laterality: Left;  LEFT URETEROSCOPY, STONE MANIPULATION, left STENT exchange   . CYSTOSCOPY WITH STENT PLACEMENT  10/02/2012   Procedure: CYSTOSCOPY WITH STENT PLACEMENT;  Surgeon: Alexis Frock, MD;  [facility];  Service: Urology;  Laterality: Left;  . DILATION AND CURETTAGE OF UTERUS  Feb 2004   s/p for 1st trimester nonviable pregnancy  . EYE SURGERY     sty under eyelid  . INCISE AND DRAIN ABCESS  Nov 03   s/p I &D for righ inframmary fold hidradenitis  . INCISION AND DRAINAGE PERITONSILLAR ABSCESS  Mar 03  . IR CV LINE INJECTION  06/07/2018  . IR REMOVAL TUN ACCESS W/ PORT W/O FL MOD SED  06/20/2018  . IRRIGATION AND DEBRIDEMENT ABSCESS  01/31/2012   Procedure: IRRIGATION AND DEBRIDEMENT ABSCESS;  Surgeon: Shann Medal, MD;  Location: [facility];  Service: General;  Laterality: Right;  right breast and axilla   .  NEPHROLITHOTOMY  10/02/2012   Procedure: NEPHROLITHOTOMY PERCUTANEOUS;  Surgeon: Alexis Frock, MD;  [facility];  Service: Urology;  Laterality: Right;  First Stage Percutaneous Nephrolithotomy with Surgeon Access, Left Ureteral Stent    . NEPHROLITHOTOMY  10/04/2012   Procedure: NEPHROLITHOTOMY PERCUTANEOUS SECOND LOOK;  Surgeon: Alexis Frock, MD;  [facility];  Service: Urology;  Laterality: Right;     . NEPHROLITHOTOMY  10/08/2012   Procedure: NEPHROLITHOTOMY PERCUTANEOUS;  Surgeon: Alexis Frock, MD;  [facility];  Service: Urology;  Laterality: Right;  THIRD STAGE, nephrostomy tube exchange x 2  . NEPHROLITHOTOMY  10/11/2012   Procedure: NEPHROLITHOTOMY PERCUTANEOUS SECOND LOOK;  Surgeon: Alexis Frock, MD;  [facility];  Service: Urology;  Laterality: Right;  RIGHT 4 STAGE PERCUTANOUS NEPHROLITHOTOMY, right URETEROSCOPY WITH HOLMIUM LASER   . PORTACATH PLACEMENT Left 05/17/2018   Procedure: INSERTION PORT-A-CATH;  Surgeon: Coralie Keens, MD;  Location: [facility];  Service: General;  Laterality: Left;  . RADICAL NECK DISSECTION  04/12/2017   limited/notes 04/12/2017  . RADICAL NECK DISSECTION N/A 04/12/2017   Procedure: RADICAL NECK DISSECTION;  Surgeon: Melida Quitter, MD;  Location: [facility];  Service: ENT;  Laterality: N/A;  limited neck dissection 2 hours total  . REDUCTION MAMMAPLASTY Bilateral 1998  . [place]  04/12/2017   completion/notes 04/12/2017  . THYROIDECTOMY N/A 04/12/2017   Procedure: THYROIDECTOMY;  Surgeon: Melida Quitter, MD;  Location: [facility];  Service: ENT;  Laterality: N/A;  Completion Thyroidectomy  . TOTAL THYROIDECTOMY  2010  Thyroid nodules removed- pathology at the ENT doctor 2018 were benign -Over active thyroid and thyroidectomy with benign pathology  FAMILY HISTORY Family History  Problem Relation Age of Onset  . Hypertension Mother   . Cancer Mother        throat  . Heart disease Mother         stent  . Hypertension Father   . Cancer Other        FH of Breast Cancer  . Cancer Other   . Heart disease Other   . Hypertension Other   . Stroke Other        Grandparent  . Kidney disease Other        Grandparent  . Diabetes Other        FH of Diabetes  . Hypertension Sister   . Cancer Maternal Uncle        Stomach Cancer, Lung CA  . Hypertension Brother   . Hypertension Sister   . Breast cancer Maternal Aunt   . Breast cancer Paternal Aunt 49  The patient's mother is alive at age 70. The patient's father died at 24 from lung cancer (heavy smoker). She does not know much about her father. The patient has 1 brother and 2 sisters. The patient's mother had a history of laryngeal cancer. There was a maternal cousin with breast cancer diagnosed at age 44. There were 2 maternal aunts with breast cancer, the youngest being diagnosed in the 39's. There was a maternal great aunt with breast cancer. There were 2 paternal aunts with breast cancer.    GYNECOLOGIC HISTORY:  Patient's last menstrual period was 03/31/2014. Menarche: 52 years old Age at first live birth: 52 years old She is GX P1.  Her LMP was in 2017. She did not use HRT.    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 At home is the patient's daughter, Verdie Drown, age 65, who is a Engineer, technical sales education and working part-time at the [employer]. The patient attends [employer].     ADVANCED DIRECTIVES: Not in place; at the 05/10/2018 visit the patient was given the appropriate documents to complete and notarized at her discretion   HEALTH MAINTENANCE: Social History   Tobacco Use  . Smoking status: Former Smoker    Packs/day: 0.50    Years: 15.00    Pack years: 7.50    Types: Cigarettes    Start date: 04/12/2017  . Smokeless tobacco: Never Used  Substance Use Topics  . Alcohol use: Yes    Alcohol/week: 0.0 standard drinks    Comment: social  . Drug use: No      Colonoscopy: no  PAP: [facility]. 2018  Bone density: remote   Allergies  Allergen Reactions  . Tape Rash    Rash   . Genvoya [Elviteg-Cobic-Emtricit-Tenofaf] Hives  . Lisinopril Cough  . Xylocaine [Lidocaine]     Unknown , patient states she has had lidocaine for IV starts and at the dentist with no problems  . Valsartan Cough    Pt states she tolerates medicine now    Current Outpatient Medications  Medication Sig Dispense Refill  . amLODipine (NORVASC) 10 MG tablet Take 1 tablet (10  mg total) by mouth daily. 90 tablet 0  . atorvastatin (LIPITOR) 20 MG tablet Take 1 tablet by mouth every evening for high cholesterol. NEED APPOINTMENT FOR ANY MORE REFILLS 30 tablet 0  . dexamethasone (DECADRON) 4 MG tablet Take 2 tablets (8 mg total) by mouth 2 (two) times daily. Start the day before Taxotere. Take once the day after, then 2 times a day x 2d. 30 tablet 1  . dolutegravir ([name]) 50 MG tablet Take 1 tablet (50 mg total) by mouth daily. 30 tablet 11  . doxycycline (VIBRA-TABS) 100 MG tablet Take 1 tablet (100 mg total) by mouth 2 (two) times daily. 60 tablet 5  . emtricitabine-tenofovir AF (DESCOVY) 200-25 MG tablet Take 1 tablet by mouth daily. 30 tablet 11  . glucose blood (ONETOUCH VERIO) test strip Use as instructed to test blood sugar daily 100 each 5  . hydrALAZINE (APRESOLINE) 25 MG tablet TAKE 1 TABLET BY MOUTH THREE TIMES A DAY 270 tablet 1  . irbesartan-hydrochlorothiazide (AVALIDE) 150-12.5 MG tablet TAKE 1 TABLET BY MOUTH ONCE DAILY FOR BLOOD PRESSURE. 90 tablet 1  . levothyroxine (SYNTHROID, LEVOTHROID) 175 MCG tablet TAKE 1 TABLET BY MOUTH EVERY MORNING ON AN EMPTY STOMACH WITH A FULL GLASS OF WATER. 90 tablet 0  . lidocaine-prilocaine (EMLA) cream Apply to affected area once 30 g 3  . LORazepam (ATIVAN) 0.5 MG tablet Take 1 tablet (0.5 mg total) by mouth at bedtime as needed (Nausea or vomiting). 20 tablet 0  . metFORMIN (GLUCOPHAGE) 500 MG tablet TAKE 1  TABLET BY MOUTH TWICE A DAY WITH A MEAL (DUE FOR APPT FOR ADDITIONAL REFILLS) 60 tablet 0  . metoprolol succinate (TOPROL-XL) 50 MG 24 hr tablet TAKE 1 TABLET (50 MG TOTAL) BY MOUTH DAILY. TAKE WITH OR IMMEDIATELY FOLLOWING A MEAL. 90 tablet 1  . mometasone (NASONEX) 50 MCG/ACT nasal spray PLACE 2 SPRAYS INTO THE NOSE DAILY. 17 g 3  . Multiple Vitamin (MULTIVITAMIN) tablet Take 1 tablet by mouth daily.    . ONE TOUCH LANCETS MISC Use as instructed to test blood sugar daily 200 each 5  . oxyCODONE (OXY IR/ROXICODONE) 5 MG immediate release tablet Take 1 tablet (5 mg total) by mouth every 6 (six) hours as needed for moderate pain or severe pain. 20 tablet 0  . pantoprazole (PROTONIX) 40 MG tablet Take 40 mg by mouth daily as needed (for indigestion/acid reflux.).     Marland Kitchen PROAIR RESPICLICK 299 (90 Base) MCG/ACT AEPB INHALE 2 PUFFS INTO THE LUNGS EVERY 6 HOURS AS NEEDED FOR WHEEZING OR SHORTNESS OF BREATH. 1 each 2  . prochlorperazine (COMPAZINE) 10 MG tablet Take 1 tablet (10 mg total) by mouth every 6 (six) hours as needed (Nausea or vomiting). 30 tablet 1  . traMADol (ULTRAM) 50 MG tablet Take 1 tablet (50 mg total) by mouth every 6 (six) hours as needed. 30 tablet 0   No current facility-administered medications for this visit.    Facility-Administered Medications Ordered in Other Visits  Medication Dose Route Frequency Provider Last Rate Last Dose  . 0.9 %  sodium chloride infusion   Intravenous Continuous Gardenia Phlegm, NP 500 mL/hr at 06/07/18 1034      OBJECTIVE: Middle-aged African-American woman in no acute distress  Vitals:   06/21/18 0902  BP: (!) 160/87  Pulse: 66  Resp: 18  Temp: 98.6 F (37 C)  SpO2: 100%     Body mass index is 39.01 kg/m.   Wt Readings from Last 3 Encounters:  06/21/18 234 lb 6.4 oz (106.3 kg)  06/20/18 242 lb (109.8 kg)  06/06/18 239 lb (108.4 kg)  ECOG FS:1 - Symptomatic but completely ambulatory  Sclerae unicteric, EOMs intact No cervical  or supraclavicular adenopathy Lungs no rales or rhonchi Heart regular rate and rhythm Abd soft, obese, nontender, positive bowel sounds MSK no focal spinal tenderness, no upper extremity lymphedema Neuro: nonfocal, well oriented, appropriate affect Breasts: Deferred Skin: Hidradenitis right axilla as previously noted     LAB RESULTS:  CMP     Component Value Date/Time   NA 142 06/20/2018 1022   K 3.7 06/20/2018 1022   CL 110 06/20/2018 1022   CO2 23 06/20/2018 1022   GLUCOSE 112 (H) 06/20/2018 1022   BUN 18 06/20/2018 1022   CREATININE 1.58 (H) 06/20/2018 1022   CREATININE 1.28 (H) 05/10/2018 1458   CREATININE 1.02 09/23/2015 1047   CALCIUM 9.0 06/20/2018 1022   PROT 7.4 06/06/2018 1505   ALBUMIN 3.3 (L) 06/06/2018 1505   AST 38 06/06/2018 1505   AST 28 05/10/2018 1458   ALT 48 (H) 06/06/2018 1505   ALT 23 05/10/2018 1458   ALKPHOS 145 (H) 06/06/2018 1505   BILITOT 0.4 06/06/2018 1505   BILITOT <0.2 (L) 05/10/2018 1458   GFRNONAA 37 (L) 06/20/2018 1022   GFRNONAA 48 (L) 05/10/2018 1458   GFRAA 43 (L) 06/20/2018 1022   GFRAA 56 (L) 05/10/2018 1458    No results found for: TOTALPROTELP, ALBUMINELP, A1GS, A2GS, BETS, BETA2SER, GAMS, MSPIKE, SPEI  No results found for: KPAFRELGTCHN, LAMBDASER, KAPLAMBRATIO  Lab Results  Component Value Date   WBC 9.9 06/20/2018   NEUTROABS 7.6 (H) 06/06/2018   HGB 10.4 (L) 06/20/2018   HCT 32.9 (L) 06/20/2018   MCV 87.3 06/20/2018   PLT 418 (H) 06/20/2018    '@LASTCHEMISTRY'$ @  No results found for: LABCA2  No components found for: [id number]  Recent Labs  Lab 06/20/18 1022  INR 1.08    No results found for: LABCA2  No results found for: ZYS063  No results found for: KZS010  No results found for: XNA355  No results found for: CA2729  No components found for: HGQUANT  No results found for: CEA1 / No results found for: CEA1   No results found for: AFPTUMOR  No results found for: [place]  No results found  for: [facility] Visit on 06/20/2018  Component Date Value Ref Range Status  . Sodium 06/20/2018 142  135 - 145 mmol/L Final  . Potassium 06/20/2018 3.7  3.5 - 5.1 mmol/L Final  . Chloride 06/20/2018 110  98 - 111 mmol/L Final  . CO2 06/20/2018 23  22 - 32 mmol/L Final  . Glucose, Bld 06/20/2018 112* 70 - 99 mg/dL Final  . BUN 06/20/2018 18  6 - 20 mg/dL Final  . Creatinine, Ser 06/20/2018 1.58* 0.44 - 1.00 mg/dL Final  . Calcium 06/20/2018 9.0  8.9 - 10.3 mg/dL Final  . GFR calc non Af Amer 06/20/2018 37* >60 mL/min Final  . GFR calc Af Amer 06/20/2018 43* >60 mL/min Final   Comment: (NOTE) The eGFR has been calculated using the CKD EPI equation. This calculation has not been validated in all clinical situations. eGFR's persistently <60 mL/min signify possible Chronic Kidney Disease.   [name] gap 06/20/2018 9  5 - 15 Final   Performed at [facility], [place] [address], [place], [place] [address]  . WBC 06/20/2018 9.9  4.0 - 10.5 K/uL Final  .  RBC 06/20/2018 3.77* 3.87 - 5.11 MIL/uL Final  . Hemoglobin 06/20/2018 10.4* 12.0 - 15.0 g/dL Final  . HCT 06/20/2018 32.9* 36.0 - 46.0 % Final  . MCV 06/20/2018 87.3  78.0 - 100.0 fL Final  . MCH 06/20/2018 27.6  26.0 - 34.0 pg Final  . MCHC 06/20/2018 31.6  30.0 - 36.0 g/dL Final  . RDW 06/20/2018 18.7* 11.5 - 15.5 % Final  . Platelets 06/20/2018 418* 150 - 400 K/uL Final   Performed at [facility], [place] [address] [place], [place] [address]  . Prothrombin Time 06/20/2018 13.9  11.4 - 15.2 seconds Final  . INR 06/20/2018 1.08   Final   Performed at [facility], [place] [address] [place], [place] [address]  . Glucose-Capillary 06/20/2018 122* 70 - 99 mg/dL Final    (this displays the last labs from the last 3 days)  No results found for: TOTALPROTELP, ALBUMINELP, A1GS, A2GS, BETS, BETA2SER, GAMS, MSPIKE, SPEI (this displays SPEP labs)  No results found for: KPAFRELGTCHN, LAMBDASER,  KAPLAMBRATIO (kappa/lambda light chains)  No results found for: HGBA, HGBA2QUANT, HGBFQUANT, HGBSQUAN (Hemoglobinopathy evaluation)   No results found for: LDH  Lab Results  Component Value Date   IRON 26 (L) 02/08/2007   TIBC 371 02/08/2007   IRONPCTSAT 7 (L) 02/08/2007   (Iron and TIBC)  No results found for: FERRITIN  Urinalysis    Component Value Date/Time   COLORURINE YELLOW 10/21/2014 2347   APPEARANCEUR CLOUDY (A) 10/21/2014 2347   LABSPEC 1.014 10/21/2014 2347   PHURINE 6.0 10/21/2014 2347   GLUCOSEU NEGATIVE 10/21/2014 2347   GLUCOSEU NEG mg/dL 01/23/2008 1418   HGBUR LARGE (A) 10/21/2014 2347   BILIRUBINUR negative 12/29/2017 [place] 10/21/2014 2347   PROTEINUR 2+ 12/29/2017 1046   PROTEINUR 100 (A) 10/21/2014 2347   UROBILINOGEN 0.2 12/29/2017 1046   UROBILINOGEN 0.2 10/21/2014 2347   NITRITE negative 12/29/2017 1046   NITRITE NEGATIVE 10/21/2014 2347   LEUKOCYTESUR Large (3+) (A) 12/29/2017 1046     STUDIES: Dg Chest 1 View  Result Date: 06/02/2018 CLINICAL DATA:  Check port placement EXAM: CHEST  1 VIEW COMPARISON:  05/17/2018 FINDINGS: Cardiac shadow is stable. Left chest wall port is noted with catheter tip at the junction of the innominate veins relatively stable from the prior exam. Lungs are well aerated bilaterally. No focal infiltrate or sizable effusion is seen. IMPRESSION: Left-sided chest wall port as described short of its expected location at the cavoatrial junction. No other focal abnormality is noted. Electronically Signed   By: Inez Catalina M.D.   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 The patient also had abnormal axillary lymphadenopathy bilaterally which was negative for metastatic disease upon biopsy. She is status post remote bilateral breast reduction.  Multiple skin boils are also noted in both breasts at the time of her diagnostic workup. LABS:  05/10/2018: BUN 19 Creatinine 1.28 EXAM: BILATERAL BREAST MRI WITH AND WITHOUT CONTRAST TECHNIQUE: Multiplanar, multisequence MR images of both breasts were obtained prior to and following the intravenous administration of 20 ml of MultiHance. THREE-DIMENSIONAL MR IMAGE RENDERING ON INDEPENDENT WORKSTATION: Three-dimensional MR images were rendered by post-processing of the original MR data on an independent workstation. The three-dimensional MR images were interpreted, and findings are reported in the following complete MRI report for  this study. Three dimensional images were evaluated at the independent DynaCad workstation COMPARISON:  Previous exam(s). FINDINGS: Breast composition: a. Almost entirely fat. Background parenchymal enhancement: Mild. Right breast: Signal void from post biopsy tissue marker is seen in association with a 2.6 x 2.5 x 2.4 cm enhancing, irregular mass in central inner aspect of the right breast at middle depth. This is consistent with the patient's biopsy-proven malignancy. An additional 7 x 6 x 4 mm enhancing mass is seen in the central aspect at middle depth. This is located approximately 5 cm lateral to the index lesion, with similar T1 and T2 imaging characteristics. Skin thickening and enhancement is noted along the right nipple, which is a new finding from prior mammogram from 2016. No additional suspicious mass or non mass enhancement is identified within the right breast. Left breast: No mass or abnormal enhancement within the left breast. Lymph nodes: Morphologically abnormal lymph nodes are identified within the bilateral axilla. They are associated signal voids from post biopsy tissue markers within the most abnormal/largest lymph node on each side. Ancillary findings: Skin thickening and enhancement is identified in several locations along the bilateral breasts. Particularly along  the inframammary folds both medially and laterally. This is likely consistent with the patient's history of multiple skin boils. IMPRESSION: 1. 2.6 x 2.5 x 2.4 cm enhancing mass consistent with the patient's known biopsy-proven malignancy in the medial right breast. 2. Indeterminate 7 x 6 x 4 mm enhancing mass in the central right breast at middle depth. This is located approximately 5 cm lateral to the index lesion. Recommendation is for MRI guided biopsy. 3. Indeterminate skin thickening and enhancement along the right nipple/areola. This is a new finding compared to a mammogram from 2016. Recommendation is for clinical evaluation and punch biopsy. 4. No suspicious MRI findings within the left breast. 5. Multiple foci of skin thickening and enhancement along the bilateral inframammary folds likely represent skin boils as noted at the time of diagnostic workup. Clinical correlation recommended. 6. Morphologically abnormal lymph nodes in the bilateral axilla with associated post biopsy clips. This demonstrated benign pathology bilaterally. RECOMMENDATION: 1. MRI guided biopsy of an indeterminate subcentimeter enhancing mass in the central right breast. 2. Clinical evaluation and punch biopsy of indeterminate skin thickening and enhancement at the right nipple/areola. BI-RADS CATEGORY  4: Suspicious. Electronically Signed   By: Kristopher Oppenheim M.D.   On: 05/23/2018 11:56   Ir Removal [employer] W/ [place] W/o Fl Mod Sed  Result Date: 06/20/2018 INDICATION: History of right-sided breast cancer, now with malfunctioning surgically placed left subclavian vein approach port a catheter. Request made for [facility] a Catheter revision for durable intravenous access for chemotherapy administration. EXAM: FLUOROSCOPIC GUIDED PORT PLACEMENT/REVISION COMPARISON:  Port injection - 06/07/2018; chest radiograph - 06/01/2018 MEDICATIONS: Ancef 2 gm IV; The antibiotic was administered within an appropriate time interval prior to skin  puncture. ANESTHESIA/SEDATION: Moderate (conscious) sedation was employed during this procedure. A total of Versed 2 mg and Fentanyl 100 mcg was administered intravenously. Moderate Sedation Time: 42 minutes. The patient's level of consciousness and vital signs were monitored continuously by radiology nursing throughout the procedure under my direct supervision. CONTRAST:  None FLUOROSCOPY TIME:  1 minutes, 12 seconds (12 mGy) COMPLICATIONS: None immediate. PROCEDURE: The procedure, risks, benefits, and alternatives were explained to the patient. Questions regarding the procedure were encouraged and answered. The patient understands and consents to the procedure. The left neck, chest and reservoir pocket from previous surgically placed port a catheter  was prepped with chlorhexidine in a sterile fashion, and a sterile drape was applied covering the operative field. Maximum barrier sterile technique with sterile gowns and gloves were used for the procedure. A timeout was performed prior to the initiation of the procedure. Local anesthesia was provided with 1% lidocaine with epinephrine. After creating a small venotomy incision, a micropuncture kit was utilized to access the internal jugular vein. Real-time ultrasound guidance was utilized for vascular access including the acquisition of a permanent ultrasound image documenting patency of the accessed vessel. The microwire was utilized to measure appropriate catheter length. Next, the existing surgically placed subclavian vein approach port a catheter was removed using a combination sharp and blunt dissection after the overlying soft tissues were anesthetized with 1% lidocaine with epinephrine. The pocket was irrigated with sterile saline. A single lumen regular sized power injectable port was chosen for placement. The 8 Fr catheter was tunneled from the port pocket site to the venotomy incision. The port was placed in the pocket. The external catheter was trimmed to  appropriate length. At the venotomy, an 8 Fr peel-away sheath was placed over a guidewire under fluoroscopic guidance. The catheter was then placed through the sheath and the sheath was removed. Final catheter positioning was confirmed and documented with a fluoroscopic spot radiograph. The port was accessed with a Huber needle, aspirated and flushed with heparinized saline. The venotomy site was closed with an interrupted 4-0 Vicryl suture. The port pocket incision was closed with interrupted 2-0 Vicryl suture and the skin was opposed with a running subcuticular 4-0 Vicryl suture. Dermabond and Steri-strips were applied to both incisions. Dressings were placed. The patient tolerated the procedure well without immediate post procedural complication. FINDINGS: After catheter placement, the new left internal jugular approach port a catheter tip lies within the superior cavoatrial junction. The catheter aspirates and flushes normally and is ready for immediate use. IMPRESSION: Successful replacement of a new left internal jugular approach power injectable Port-A-Cath. The catheter is ready for immediate use. Electronically Signed   By: Sandi Mariscal M.D.   On: 06/20/2018 14:13   Ir Cv Line Injection  Result Date: 06/07/2018 INDICATION: Breast cancer, poor functioning left port catheter, unable to draw blood EXAM: FLUOROSCOPIC INJECTION OF THE EXISTING LEFT SUBCLAVIAN PORT CATHETER MEDICATIONS: NONE. ANESTHESIA/SEDATION: Moderate Sedation Time: NONE. Minutes. The patient's level of consciousness and vital signs were monitored continuously by radiology nursing throughout the procedure under my direct supervision. FLUOROSCOPY TIME:  Fluoroscopy Time: 0 minutes 12 seconds (2.6 mGy). COMPLICATIONS: None immediate. PROCEDURE: Informed written consent was obtained from the patient after a thorough discussion of the procedural risks, benefits and alternatives. All questions were addressed. Maximal Sterile Barrier Technique  was utilized including caps, mask, sterile gowns, sterile gloves, sterile drape, hand hygiene and skin antiseptic. A timeout was performed prior to the initiation of the procedure. Under sterile conditions, the left subclavian power port catheter was accessed. Contrast injection performed with subtraction imaging. Left subclavian power port catheter is patent. Tip is at the innominate SVC junction. Trace amount of fibrin sheath at the catheter tip. SVC remains patent. IMPRESSION: Trace amount left subclavian port catheter tip fibrin sheath. Port catheter tip is short at the innominate SVC junction level. Electronically Signed   By: Jerilynn Mages.  Shick M.D.   On: 06/07/2018 09:38    ELIGIBLE FOR AVAILABLE RESEARCH PROTOCOL: UPBEAT  ASSESSMENT: 52 y.o. [place], [place] woman status post right breast upper inner quadrant biopsy 04/26/2018 for a clinical T2 N0, stage Ib  invasive ductal carcinoma, grade 2, with an MIB-1 of 70%.  (1) genetics testing 07/03/2018  (2) neoadjuvant chemotherapy will consist of carboplatin, docetaxel, trastuzumab and Pertuzumab given every 21 days x 6, starting 05/31/2018  (a) epratuzumab discontinued starting with cycle 2 because of diarrhea problems  (3) trastuzumab to continue to total 6 months  (a) echo on 05/16/2018 shows an EF of 60-65%.  (4) definitive surgery to follow chemotherapy I, Lurline Del MD, have reviewed the above documentation for accuracy and completeness, and I agree with the above.  (5) adjuvant radiation to follow surgery  (6) antiestrogens to start at the completion of local treatment  (7) hidradenitis/boils: on doxycycline daily  PLAN: Jamala did well with her port revision yesterday and the new one is ready to be used.  We have discussed this with nursing.  I am omitting the epratuzumab from the next couple of cycles because of the significant diarrhea she had previously.  We will consider possibly resuming it with cycle 4.  I was going to start her  on doxycycline daily but this has already been done by infectious disease, Dr. [name].  I have refilled her prescription.  She tells me her sugars are well controlled and were tested yesterday x2 and were less than 130 both times  She is already scheduled for genetics testing 07/03/2018.  We are going to see her on the day of treatment for the next several cycles.  I urged her to call us with any questions or concerns that she may have prior to the next visit.

## 2018-06-20 NOTE — H&P (Signed)
Chief Complaint: Patient was seen in consultation today for breast cancer, non-functioning Port-A-Cath  Referring Physician(s): Chauncey Cruel  Supervising Physician: Sandi Mariscal  Patient Status: [name]  History of Present Illness: [name] is a 52 y.o. female with past medical history of anemia, DM, GERD, Grave's disease, HTN, HIV, and recent diagnosis of breast cancer.  Patient underwent Port-A-Cath placement 05/17/18 with general surgery, however tip location not appropriate for chemotherapy administration.  She is referred to IR for Port-A-Cath revision.   Patient presents today in her usual state of health. Denies any new complaints today.  She has been NPO. She does not take blood thinners.   Past Medical History:  Diagnosis Date  . Anemia    Normocytic  . Anxiety   . Asthma   . Blood dyscrasia   . Bronchitis 2005  . 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 Diabetes mellitus without complication ([place])   . Gastroenteritis 07/10/07  . GERD 07/24/2006  . GRAVE'S DISEASE 01/01/2008  . History of hidradenitis suppurativa   . History of kidney stones   . History of thrush   . HIV DISEASE 07/24/2006   dx March 05  . HYPERTENSION 07/24/2006  . Hyperthyroidism 08/2006   Grave's Disease -diffuse radiotracer uptake 08/25/06 Thyroid scan-Cold nodule to R lower lobe of thyrorid  . Menometrorrhagia    hx of  . Nephrolithiasis   . 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 Pneumonia 2005  . 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 Sarcoidosis 02/08/2007   dx as a teenager in [place] from abnl CXR. Completed 2 yrs Prednisone after lung bx confirmation. No symptoms since then.  . Thyroid cancer ([place])   . THYROID NODULE, RIGHT 02/08/2007    Past Surgical History:  Procedure Laterality Date  . BREAST SURGERY  1997   Breast Reduction   . CYSTOSCOPY W/ URETERAL STENT REMOVAL  11/09/2012   Procedure:  CYSTOSCOPY WITH STENT REMOVAL;  Surgeon: Alexis Frock, MD;  Location: [facility];  Service: Urology;  Laterality: Right;  . CYSTOSCOPY WITH RETROGRADE PYELOGRAM, URETEROSCOPY AND STENT PLACEMENT  11/09/2012   Procedure: CYSTOSCOPY WITH RETROGRADE PYELOGRAM, URETEROSCOPY AND STENT PLACEMENT;  Surgeon: Alexis Frock, MD;  Location: [facility];  Service: Urology;  Laterality: Left;  LEFT URETEROSCOPY, STONE MANIPULATION, left STENT exchange   . CYSTOSCOPY WITH STENT PLACEMENT  10/02/2012   Procedure: CYSTOSCOPY WITH STENT PLACEMENT;  Surgeon: Alexis Frock, MD;  Location: [facility];  Service: Urology;  Laterality: Left;  . DILATION AND CURETTAGE OF UTERUS  Feb 2004   s/p for 1st trimester nonviable pregnancy  . EYE SURGERY     sty under eyelid  . INCISE AND DRAIN ABCESS  Nov 03   s/p I &D for righ inframmary fold hidradenitis  . INCISION AND DRAINAGE PERITONSILLAR ABSCESS  Mar 03  . IR CV LINE INJECTION  06/07/2018  . IRRIGATION AND DEBRIDEMENT ABSCESS  01/31/2012   Procedure: IRRIGATION AND DEBRIDEMENT ABSCESS;  Surgeon: Shann Medal, MD;  Location: [facility];  Service: General;  Laterality: Right;  right breast and axilla   . NEPHROLITHOTOMY  10/02/2012   Procedure: NEPHROLITHOTOMY PERCUTANEOUS;  Surgeon: Alexis Frock, MD;  Location: [facility];  Service: Urology;  Laterality: Right;  First Stage Percutaneous Nephrolithotomy with Surgeon Access, Left Ureteral Stent    . NEPHROLITHOTOMY  10/04/2012   Procedure: NEPHROLITHOTOMY PERCUTANEOUS SECOND LOOK;  Surgeon: Alexis Frock, MD;  Location: [facility];  Service: Urology;  Laterality: Right;     .  NEPHROLITHOTOMY  10/08/2012   Procedure: NEPHROLITHOTOMY PERCUTANEOUS;  Surgeon: Alexis Frock, MD;  Location: [facility];  Service: Urology;  Laterality: Right;  THIRD STAGE, nephrostomy tube exchange x 2  . NEPHROLITHOTOMY  10/11/2012   Procedure: NEPHROLITHOTOMY PERCUTANEOUS SECOND LOOK;  Surgeon: Alexis Frock, MD;  Location: [facility];  Service: Urology;   Laterality: Right;  RIGHT 4 STAGE PERCUTANOUS NEPHROLITHOTOMY, right URETEROSCOPY WITH HOLMIUM LASER   . PORTACATH PLACEMENT Left 05/17/2018   Procedure: INSERTION PORT-A-CATH;  Surgeon: Coralie Keens, MD;  Location: [place];  Service: General;  Laterality: Left;  . RADICAL NECK DISSECTION  04/12/2017   limited/notes 04/12/2017  . RADICAL NECK DISSECTION N/A 04/12/2017   Procedure: RADICAL NECK DISSECTION;  Surgeon: Melida Quitter, MD;  Location: [place];  Service: ENT;  Laterality: N/A;  limited neck dissection 2 hours total  . REDUCTION MAMMAPLASTY Bilateral 1998  . [place]  1994  . THYROIDECTOMY  04/12/2017   completion/notes 04/12/2017  . THYROIDECTOMY N/A 04/12/2017   Procedure: THYROIDECTOMY;  Surgeon: Melida Quitter, MD;  Location: [place];  Service: ENT;  Laterality: N/A;  Completion Thyroidectomy  . TOTAL THYROIDECTOMY  2010    Allergies: Tape; Genvoya [elviteg-cobic-emtricit-tenofaf]; Lisinopril; Xylocaine [lidocaine]; and Valsartan  Medications: Prior to Admission medications   Medication Sig Start Date End Date Taking? Authorizing Provider  amLODipine (NORVASC) 10 MG tablet Take 1 tablet (10 mg total) by mouth daily. 11/25/16  Yes [name], NP  atorvastatin (LIPITOR) 20 MG tablet Take 1 tablet by mouth every evening for high cholesterol. NEED APPOINTMENT FOR ANY MORE REFILLS 04/30/18  Yes [name], NP  dolutegravir ([name]) 50 MG tablet Take 1 tablet (50 mg total) by mouth daily. 05/28/18  Yes Michel Bickers, MD  doxycycline (VIBRA-TABS) 100 MG tablet Take 1 tablet (100 mg total) by mouth 2 (two) times daily. 05/28/18  Yes Michel Bickers, MD  emtricitabine-tenofovir AF (DESCOVY) 200-25 MG tablet Take 1 tablet by mouth daily. 05/28/18  Yes Michel Bickers, MD  irbesartan-hydrochlorothiazide (AVALIDE) 150-12.5 MG tablet TAKE 1 TABLET BY MOUTH ONCE DAILY FOR BLOOD PRESSURE. 04/06/18  Yes [name], NP  levothyroxine (SYNTHROID, LEVOTHROID) 175 MCG  tablet TAKE 1 TABLET BY MOUTH EVERY MORNING ON AN EMPTY STOMACH WITH A FULL GLASS OF WATER. 03/10/18  Yes [name], NP  LORazepam (ATIVAN) 0.5 MG tablet Take 1 tablet (0.5 mg total) by mouth at bedtime as needed (Nausea or vomiting). 05/25/18  Yes Magrinat, Virgie Dad, MD  metFORMIN (GLUCOPHAGE) 500 MG tablet TAKE 1 TABLET BY MOUTH TWICE A DAY WITH A MEAL (DUE FOR APPT FOR ADDITIONAL REFILLS) 04/26/18  Yes [name], NP  metoprolol succinate (TOPROL-XL) 50 MG 24 hr tablet TAKE 1 TABLET (50 MG TOTAL) BY MOUTH DAILY. TAKE WITH OR IMMEDIATELY FOLLOWING A MEAL. 02/28/18  Yes [name], NP  mometasone (NASONEX) 50 MCG/ACT nasal spray PLACE 2 SPRAYS INTO THE NOSE DAILY. 06/23/17  Yes [name], NP  Multiple Vitamin (MULTIVITAMIN) tablet Take 1 tablet by mouth daily.   Yes [provider]  oxyCODONE (OXY IR/ROXICODONE) 5 MG immediate release tablet Take 1 tablet (5 mg total) by mouth every 6 (six) hours as needed for moderate pain or severe pain. 05/17/18  Yes Coralie Keens, MD  pantoprazole (PROTONIX) 40 MG tablet Take 40 mg by mouth daily as needed (for indigestion/acid reflux.).  11/24/16  Yes [provider]  PROAIR RESPICLICK 485 (90 Base) MCG/ACT AEPB INHALE 2 PUFFS INTO THE LUNGS EVERY 6  HOURS AS NEEDED FOR WHEEZING OR SHORTNESS OF BREATH. 09/28/17  Yes [name], NP  dexamethasone (DECADRON) 4 MG tablet Take 2 tablets (8 mg total) by mouth 2 (two) times daily. Start the day before Taxotere. Take once the day after, then 2 times a day x 2d. 05/25/18   Magrinat, Virgie Dad, MD  glucose blood [facility] VERIO) test strip Use as instructed to test blood sugar daily 06/08/17   [name], NP  hydrALAZINE (APRESOLINE) 25 MG tablet TAKE 1 TABLET BY MOUTH THREE TIMES A DAY 01/23/18   [name], NP  lidocaine-prilocaine (EMLA) cream Apply to affected area once 05/25/18   Magrinat, Virgie Dad, MD  ONE [facility] LANCETS MISC Use as instructed to test blood  sugar daily 06/08/17   [name], NP  prochlorperazine (COMPAZINE) 10 MG tablet Take 1 tablet (10 mg total) by mouth every 6 (six) hours as needed (Nausea or vomiting). 05/25/18   Magrinat, Virgie Dad, MD  traMADol (ULTRAM) 50 MG tablet Take 1 tablet (50 mg total) by mouth every 6 (six) hours as needed. 06/06/18   Gardenia Phlegm, NP     Family History  Problem Relation Age of Onset  . Hypertension Mother   . Cancer Mother        throat  . Heart disease Mother        stent  . Hypertension Father   . Cancer Other        FH of Breast Cancer  . Cancer Other   . Heart disease Other   . Hypertension Other   . Stroke Other        Grandparent  . Kidney disease Other        Grandparent  . Diabetes Other        FH of Diabetes  . Hypertension Sister   . Cancer Maternal Uncle        Stomach Cancer, Lung CA  . Hypertension Brother   . Hypertension Sister   . Breast cancer Maternal Aunt   . Breast cancer Paternal Aunt 72    Social History   Socioeconomic History  . Marital status: Single    Spouse name: Not on file  . Number of children: 1  . Years of education: Not on file  . Highest education level: Not on file  Occupational History  . Occupation: Secondary school teacher  Social Needs  . Financial resource strain: Not on file  . Food insecurity:    Worry: Not on file    Inability: Not on file  . Transportation needs:    Medical: Not on file    Non-medical: Not on file  Tobacco Use  . Smoking status: Former Smoker    Packs/day: 0.50    Years: 15.00    Pack years: 7.50    Types: Cigarettes    Start date: 04/12/2017  . Smokeless tobacco: Never Used  Substance and Sexual Activity  . Alcohol use: Yes    Alcohol/week: 0.0 standard drinks    Comment: social  . Drug use: No  . Sexual activity: Not Currently    Birth control/protection: Post-menopausal    Comment: declined condoms  Lifestyle  . Physical activity:    Days per week: Not on file    Minutes per  session: Not on file  . Stress: Not on file  Relationships  . Social connections:    Talks on phone: Not on file    Gets together: Not on file  Attends religious service: Not on file    Active member of club or organization: Not on file    Attends meetings of clubs or organizations: Not on file    Relationship status: Not on file  Other Topics Concern  . Not on file  Social History Narrative  . Not on file     Review of Systems: A 12 point ROS discussed and pertinent positives are indicated in the HPI above.  All other systems are negative.  Review of Systems  Constitutional: Negative for fatigue and fever.  Respiratory: Negative for cough and shortness of breath.   Cardiovascular: Negative for chest pain.  Gastrointestinal: Negative for abdominal pain, diarrhea, nausea and vomiting.  Genitourinary: Negative for dysuria.  Musculoskeletal: Negative for back pain.  Psychiatric/Behavioral: Negative for behavioral problems and confusion.    Vital Signs: BP 139/83 (BP Location: Left Arm)   Pulse 68   Temp 98.2 F (36.8 C) (Oral)   Resp 17   Ht 5\' 5"  (1.651 m)   Wt 242 lb (109.8 kg)   LMP 03/31/2014 Comment: Negative Serum HCG 06/07/17  SpO2 98%   BMI 40.27 kg/m   Physical Exam  Constitutional: She is oriented to person, place, and time. She appears well-developed.  Neck: Normal range of motion. Neck supple.  Cardiovascular: Normal rate, regular rhythm and normal heart sounds. Exam reveals no gallop and no friction rub.  No murmur heard. Pulmonary/Chest: Effort normal and breath sounds normal. No respiratory distress.    Neurological: She is alert and oriented to person, place, and time.  Skin: Skin is warm and dry.  Psychiatric: She has a normal mood and affect. Her behavior is normal. Judgment and thought content normal.  Nursing note and vitals reviewed.    MD Evaluation Airway: WNL Heart: WNL Abdomen: WNL Chest/ Lungs: WNL ASA  Classification:  3 Mallampati/Airway Score: One   Imaging: Dg Chest 1 View  Result Date: 06/02/2018 CLINICAL DATA:  Check port placement EXAM: CHEST  1 VIEW COMPARISON:  05/17/2018 FINDINGS: Cardiac shadow is stable. Left chest wall port is noted with catheter tip at the junction of the innominate veins relatively stable from the prior exam. Lungs are well aerated bilaterally. No focal infiltrate or sizable effusion is seen. IMPRESSION: Left-sided chest wall port as described short of its expected location at the cavoatrial junction. No other focal abnormality is noted. Electronically Signed   By: Inez Catalina M.D.   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 The patient also had abnormal axillary lymphadenopathy bilaterally which was negative for metastatic disease upon biopsy. She is status post remote bilateral breast reduction. Multiple skin boils are also noted in both breasts at the time of her diagnostic workup. LABS:  05/10/2018: BUN 19 Creatinine 1.28 EXAM: BILATERAL BREAST MRI WITH AND WITHOUT CONTRAST TECHNIQUE: Multiplanar, multisequence MR images of both breasts were obtained prior to and following the intravenous administration of 20 ml of MultiHance. THREE-DIMENSIONAL MR IMAGE RENDERING ON INDEPENDENT WORKSTATION: Three-dimensional MR images were rendered by post-processing of the original MR data on an independent workstation. The three-dimensional MR images were interpreted, and findings are reported in the following complete MRI report for this study. Three dimensional images were evaluated at the independent DynaCad workstation COMPARISON:  Previous exam(s). FINDINGS: Breast composition: a. Almost entirely fat. Background parenchymal enhancement: Mild. Right breast:  Signal void from post biopsy tissue marker is seen in association with a 2.6 x  2.5 x 2.4 cm enhancing, irregular mass in central inner aspect of the right breast at middle depth. This is consistent with the patient's biopsy-proven malignancy. An additional 7 x 6 x 4 mm enhancing mass is seen in the central aspect at middle depth. This is located approximately 5 cm lateral to the index lesion, with similar T1 and T2 imaging characteristics. Skin thickening and enhancement is noted along the right nipple, which is a new finding from prior mammogram from 2016. No additional suspicious mass or non mass enhancement is identified within the right breast. Left breast: No mass or abnormal enhancement within the left breast. Lymph nodes: Morphologically abnormal lymph nodes are identified within the bilateral axilla. They are associated signal voids from post biopsy tissue markers within the most abnormal/largest lymph node on each side. Ancillary findings: Skin thickening and enhancement is identified in several locations along the bilateral breasts. Particularly along the inframammary folds both medially and laterally. This is likely consistent with the patient's history of multiple skin boils. IMPRESSION: 1. 2.6 x 2.5 x 2.4 cm enhancing mass consistent with the patient's known biopsy-proven malignancy in the medial right breast. 2. Indeterminate 7 x 6 x 4 mm enhancing mass in the central right breast at middle depth. This is located approximately 5 cm lateral to the index lesion. Recommendation is for MRI guided biopsy. 3. Indeterminate skin thickening and enhancement along the right nipple/areola. This is a new finding compared to a mammogram from 2016. Recommendation is for clinical evaluation and punch biopsy. 4. No suspicious MRI findings within the left breast. 5. Multiple foci of skin thickening and enhancement along the bilateral inframammary folds likely represent skin boils as noted at the time of diagnostic workup. Clinical correlation recommended. 6. Morphologically abnormal lymph nodes  in the bilateral axilla with associated post biopsy clips. This demonstrated benign pathology bilaterally. RECOMMENDATION: 1. MRI guided biopsy of an indeterminate subcentimeter enhancing mass in the central right breast. 2. Clinical evaluation and punch biopsy of indeterminate skin thickening and enhancement at the right nipple/areola. BI-RADS CATEGORY  4: Suspicious. Electronically Signed   By: Kristopher Oppenheim M.D.   On: 05/23/2018 11:56   Ir Cv Line Injection  Result Date: 06/07/2018 INDICATION: Breast cancer, poor functioning left port catheter, unable to draw blood EXAM: FLUOROSCOPIC INJECTION OF THE EXISTING LEFT SUBCLAVIAN PORT CATHETER MEDICATIONS: NONE. ANESTHESIA/SEDATION: Moderate Sedation Time: NONE. Minutes. The patient's level of consciousness and vital signs were monitored continuously by radiology nursing throughout the procedure under my direct supervision. FLUOROSCOPY TIME:  Fluoroscopy Time: 0 minutes 12 seconds (2.6 mGy). COMPLICATIONS: None immediate. PROCEDURE: Informed written consent was obtained from the patient after a thorough discussion of the procedural risks, benefits and alternatives. All questions were addressed. Maximal Sterile Barrier Technique was utilized including caps, mask, sterile gowns, sterile gloves, sterile drape, hand hygiene and skin antiseptic. A timeout was performed prior to the initiation of the procedure. Under sterile conditions, the left subclavian power port catheter was accessed. Contrast injection performed with subtraction imaging. Left subclavian power port catheter is patent. Tip is at the innominate SVC junction. Trace amount of fibrin sheath at the catheter tip. SVC remains patent. IMPRESSION: Trace amount left subclavian port catheter tip fibrin sheath. Port catheter tip is short at the innominate SVC junction level. Electronically Signed   By: Jerilynn Mages.  Shick M.D.   On: 06/07/2018 09:38    Labs:  CBC: Recent  Labs    05/10/18 1458 05/31/18 0850  06/06/18 1505 06/20/18 1022  WBC 8.1 7.0 11.6* 9.9  HGB 11.8 11.1* 10.8* 10.4*  HCT 36.3 33.3* 32.7* 32.9*  PLT 334 354 302 418*    COAGS: Recent Labs    06/20/18 1022  INR 1.08    BMP: Recent Labs    05/10/18 1458 05/31/18 0850 06/06/18 1505 06/20/18 1022  NA 144 142 141 142  K 3.3* 3.8 3.7 3.7  CL 105 105 108 110  CO2 28 27 26 23   GLUCOSE 74 167* 77 112*  BUN 19 19 19 18   CALCIUM 9.6 9.3 8.9 9.0  CREATININE 1.28* 1.30* 1.34* 1.58*  GFRNONAA 48* 47* 45* 37*  GFRAA 56* 54* 53* 43*    LIVER FUNCTION TESTS: Recent Labs    10/13/17 1209 05/10/18 1458 05/31/18 0850 06/06/18 1505  BILITOT 0.3 <0.2* <0.2* 0.4  AST 19 28 14* 38  ALT 25 23 13  48*  ALKPHOS 82 106 107 145*  PROT 7.9 8.5* 7.6 7.4  ALBUMIN 3.9 3.8 3.3* 3.3*    TUMOR MARKERS: No results for input(s): AFPTM, CEA, CA199, CHROMGRNA in the last 8760 hours.  Assessment and Plan: Patient with past medical history of breast cancer s/p Port-A-Cath placement presents to [facility] radiology in need of Port revision.  Case reviewed by Dr. Pascal Lux who approves patient for procedure.  Patient presents today in their usual state of health.  She has been NPO and is not currently on blood thinners.   Risks and benefits of image guided port-a-catheter placement was discussed with the patient including, but not limited to bleeding, infection, pneumothorax, or fibrin sheath development and need for additional procedures.  All of the patient's questions were answered, patient is agreeable to proceed. Consent signed and in chart.  Thank you for this interesting consult.  I greatly enjoyed meeting [name] and look forward to participating in their care.  A copy of this report was sent to the requesting provider on this date.  Electronically Signed: Docia Barrier, PA 06/20/2018, 11:49 AM    I spent a total of  30 Minutes   in face to face in clinical consultation, greater than 50% of which was  counseling/coordinating care for breast cancer.

## 2018-06-20 NOTE — Procedures (Signed)
Pre Procedure Dx: Poor venous access; Poorly functioning port-a-catheter Post Procedural Dx: Same  Successful replacement of new left IJ approach port-a-cath with tip at the superior caval atrial junction. The catheter is ready for immediate use.  Estimated Blood Loss: Minimal  Complications: None immediate.  Ronny Bacon, MD Pager #: [phone]

## 2018-06-20 NOTE — Discharge Instructions (Addendum)
Implanted Port Insertion, Care After °This sheet gives you information about how to care for yourself after your procedure. Your health care provider may also give you more specific instructions. If you have problems or questions, contact your health care provider. °What can I expect after the procedure? °After your procedure, it is common to have: °· Discomfort at the port insertion site. °· Bruising on the skin over the port. This should improve over 3-4 days. ° °Follow these instructions at home: °Port care °· After your port is placed, you will get a manufacturer's information card. The card has information about your port. Keep this card with you at all times. °· Take care of the port as told by your health care provider. Ask your health care provider if you or a family member can get training for taking care of the port at home. A home health care nurse may also take care of the port. °· Make sure to remember what type of port you have. °Incision care °· Follow instructions from your health care provider about how to take care of your port insertion site. Make sure you: °? Wash your hands with soap and water before you change your bandage (dressing). If soap and water are not available, use hand sanitizer. °? Change your dressing as told by your health care provider. °? Leave stitches (sutures), skin glue, or adhesive strips in place. These skin closures may need to stay in place for 2 weeks or longer. If adhesive strip edges start to loosen and curl up, you may trim the loose edges. Do not remove adhesive strips completely unless your health care provider tells you to do that. °· Check your port insertion site every day for signs of infection. Check for: °? More redness, swelling, or pain. °? More fluid or blood. °? Warmth. °? Pus or a bad smell. °General instructions °· Do not take baths, swim, or use a hot tub until your health care provider approves. °· Do not lift anything that is heavier than 10 lb (4.5  kg) for a week, or as told by your health care provider. °· Ask your health care provider when it is okay to: °? Return to work or school. °? Resume usual physical activities or sports. °· Do not drive for 24 hours if you were given a medicine to help you relax (sedative). °· Take over-the-counter and prescription medicines only as told by your health care provider. °· Wear a medical alert bracelet in case of an emergency. This will tell any health care providers that you have a port. °· Keep all follow-up visits as told by your health care provider. This is important. °Contact a health care provider if: °· You cannot flush your port with saline as directed, or you cannot draw blood from the port. °· You have a fever or chills. °· You have more redness, swelling, or pain around your port insertion site. °· You have more fluid or blood coming from your port insertion site. °· Your port insertion site feels warm to the touch. °· You have pus or a bad smell coming from the port insertion site. °Get help right away if: °· You have chest pain or shortness of breath. °· You have bleeding from your port that you cannot control. °Summary °· Take care of the port as told by your health care provider. °· Change your dressing as told by your health care provider. °· Keep all follow-up visits as told by your health care provider. °  This information is not intended to replace advice given to you by your health care provider. Make sure you discuss any questions you have with your health care provider. °Document Released: 07/24/2013 Document Revised: 08/24/2016 Document Reviewed: 08/24/2016 °[employer] Interactive Patient Education © 2017 [employer] °Moderate Conscious Sedation, Adult, Care After °These instructions provide you with information about caring for yourself after your procedure. Your health care provider may also give you more specific instructions. Your treatment has been planned according to current medical  practices, but problems sometimes occur. Call your health care provider if you have any problems or questions after your procedure. °What can I expect after the procedure? °After your procedure, it is common: °· To feel sleepy for several hours. °· To feel clumsy and have poor balance for several hours. °· To have poor judgment for several hours. °· To vomit if you eat too soon. ° °Follow these instructions at home: °For at least 24 hours after the procedure: ° °· Do not: °? Participate in activities where you could fall or become injured. °? Drive. °? Use heavy machinery. °? Drink alcohol. °? Take sleeping pills or medicines that cause drowsiness. °? Make important decisions or sign legal documents. °? Take care of children on your own. °· Rest. °Eating and drinking °· Follow the diet recommended by your health care provider. °· If you vomit: °? Drink water, juice, or soup when you can drink without vomiting. °? Make sure you have little or no nausea before eating solid foods. °General instructions °· Have a responsible adult stay with you until you are awake and alert. °· Take over-the-counter and prescription medicines only as told by your health care provider. °· If you smoke, do not smoke without supervision. °· Keep all follow-up visits as told by your health care provider. This is important. °Contact a health care provider if: °· You keep feeling nauseous or you keep vomiting. °· You feel light-headed. °· You develop a rash. °· You have a fever. °Get help right away if: °· You have trouble breathing. °This information is not intended to replace advice given to you by your health care provider. Make sure you discuss any questions you have with your health care provider. °Document Released: 07/24/2013 Document Revised: 03/07/2016 Document Reviewed: 01/23/2016 °[employer]vier Interactive Patient Education © 2018 [employer] ° °

## 2018-06-20 NOTE — Telephone Encounter (Signed)
Per Dr [name] called the patient to check on her and see if boils are getting better. She advised the large one under her breast has opened and is not draining but she is still having issue with them. Advised her of the switch to Amoxicillian from Doxy and if that does not help she will need to see Dr Rush Farmer. The patient advised she wants to stay on her Doxy until she sees the Dr [name] on 07/11/18 and will discuss with him at that time. Advised her will let the provider know.

## 2018-06-21 ENCOUNTER — Inpatient Hospital Stay: Payer: Managed Care, Other (non HMO)

## 2018-06-21 ENCOUNTER — Inpatient Hospital Stay (HOSPITAL_BASED_OUTPATIENT_CLINIC_OR_DEPARTMENT_OTHER): Payer: Managed Care, Other (non HMO) | Admitting: Oncology

## 2018-06-21 ENCOUNTER — Inpatient Hospital Stay: Payer: Managed Care, Other (non HMO) | Attending: Oncology

## 2018-06-21 ENCOUNTER — Other Ambulatory Visit: Payer: Self-pay

## 2018-06-21 ENCOUNTER — Telehealth: Payer: Self-pay | Admitting: *Deleted

## 2018-06-21 ENCOUNTER — Encounter: Payer: Self-pay | Admitting: *Deleted

## 2018-06-21 ENCOUNTER — Telehealth: Payer: Self-pay | Admitting: Oncology

## 2018-06-21 ENCOUNTER — Encounter (HOSPITAL_COMMUNITY): Payer: Self-pay | Admitting: Interventional Radiology

## 2018-06-21 VITALS — BP 160/87 | HR 66 | Temp 98.6°F | Resp 18 | Ht 65.0 in | Wt 234.4 lb

## 2018-06-21 DIAGNOSIS — I10 Essential (primary) hypertension: Secondary | ICD-10-CM

## 2018-06-21 DIAGNOSIS — C50211 Malignant neoplasm of upper-inner quadrant of right female breast: Secondary | ICD-10-CM | POA: Insufficient documentation

## 2018-06-21 DIAGNOSIS — Z5189 Encounter for other specified aftercare: Secondary | ICD-10-CM | POA: Insufficient documentation

## 2018-06-21 DIAGNOSIS — B20 Human immunodeficiency virus [HIV] disease: Secondary | ICD-10-CM

## 2018-06-21 DIAGNOSIS — Z5111 Encounter for antineoplastic chemotherapy: Secondary | ICD-10-CM | POA: Diagnosis present

## 2018-06-21 DIAGNOSIS — Z170 Estrogen receptor positive status [ER+]: Secondary | ICD-10-CM

## 2018-06-21 DIAGNOSIS — C50311 Malignant neoplasm of lower-inner quadrant of right female breast: Secondary | ICD-10-CM

## 2018-06-21 DIAGNOSIS — L732 Hidradenitis suppurativa: Secondary | ICD-10-CM

## 2018-06-21 DIAGNOSIS — Z5112 Encounter for antineoplastic immunotherapy: Secondary | ICD-10-CM | POA: Diagnosis present

## 2018-06-21 DIAGNOSIS — Z87891 Personal history of nicotine dependence: Secondary | ICD-10-CM

## 2018-06-21 MED ORDER — ACETAMINOPHEN 325 MG PO TABS
ORAL_TABLET | ORAL | Status: AC
  Filled 2018-06-21: qty 2

## 2018-06-21 MED ORDER — SODIUM CHLORIDE 0.9 % IV SOLN
75.0000 mg/m2 | Freq: Once | INTRAVENOUS | Status: AC
  Administered 2018-06-21: 170 mg via INTRAVENOUS
  Filled 2018-06-21: qty 17

## 2018-06-21 MED ORDER — DIPHENHYDRAMINE HCL 25 MG PO CAPS
ORAL_CAPSULE | ORAL | Status: AC
  Filled 2018-06-21: qty 2

## 2018-06-21 MED ORDER — DOXYCYCLINE HYCLATE 100 MG PO TABS: 100.0000 mg | ORAL_TABLET | Freq: Every day | ORAL | 2 refills | Status: DC

## 2018-06-21 MED ORDER — SODIUM CHLORIDE 0.9% FLUSH
10.0000 mL | INTRAVENOUS | Status: DC | PRN
  Administered 2018-06-21: 10 mL
  Filled 2018-06-21: qty 10

## 2018-06-21 MED ORDER — PALONOSETRON HCL INJECTION 0.25 MG/5ML
0.2500 mg | Freq: Once | INTRAVENOUS | Status: AC
  Administered 2018-06-21: 0.25 mg via INTRAVENOUS

## 2018-06-21 MED ORDER — SODIUM CHLORIDE 0.9 % IV SOLN
Freq: Once | INTRAVENOUS | Status: AC
  Administered 2018-06-21: 10:00:00 via INTRAVENOUS
  Filled 2018-06-21: qty 250

## 2018-06-21 MED ORDER — DIPHENHYDRAMINE HCL 25 MG PO CAPS
50.0000 mg | ORAL_CAPSULE | Freq: Once | ORAL | Status: AC
  Administered 2018-06-21: 50 mg via ORAL

## 2018-06-21 MED ORDER — ACETAMINOPHEN 325 MG PO TABS
650.0000 mg | ORAL_TABLET | Freq: Once | ORAL | Status: AC
  Administered 2018-06-21: 650 mg via ORAL

## 2018-06-21 MED ORDER — PALONOSETRON HCL INJECTION 0.25 MG/5ML
INTRAVENOUS | Status: AC
  Filled 2018-06-21: qty 5

## 2018-06-21 MED ORDER — TRASTUZUMAB CHEMO 150 MG IV SOLR
6.0000 mg/kg | Freq: Once | INTRAVENOUS | Status: AC
  Administered 2018-06-21: 672 mg via INTRAVENOUS
  Filled 2018-06-21: qty 32

## 2018-06-21 MED ORDER — SODIUM CHLORIDE 0.9 % IV SOLN
500.0000 mg | Freq: Once | INTRAVENOUS | Status: AC
  Administered 2018-06-21: 500 mg via INTRAVENOUS
  Filled 2018-06-21: qty 50

## 2018-06-21 MED ORDER — SODIUM CHLORIDE 0.9 % IV SOLN
Freq: Once | INTRAVENOUS | Status: AC
  Administered 2018-06-21: 10:00:00 via INTRAVENOUS
  Filled 2018-06-21: qty 5

## 2018-06-21 MED ORDER — HEPARIN SOD (PORK) LOCK FLUSH 100 UNIT/ML IV SOLN
500.0000 [IU] | Freq: Once | INTRAVENOUS | Status: AC | PRN
  Administered 2018-06-21: 500 [IU]
  Filled 2018-06-21: qty 5

## 2018-06-21 NOTE — Telephone Encounter (Signed)
Called regarding 9/5 sch msg

## 2018-06-21 NOTE — Patient Instructions (Signed)
[facility] Discharge Instructions for Patients Receiving Chemotherapy  Today you received the following chemotherapy agents Carboplatin (Paraplatin), Trastuzumab (Herceptin), Pertuzumab (Perjeta) & Docetaxel (Taxotere).  To help prevent nausea and vomiting after your treatment, we encourage you to take your nausea medication as prescribed.   If you develop nausea and vomiting that is not controlled by your nausea medication, call the clinic.   BELOW ARE SYMPTOMS THAT SHOULD BE REPORTED IMMEDIATELY:  *FEVER GREATER THAN 100.5 F  *CHILLS WITH OR WITHOUT FEVER  NAUSEA AND VOMITING THAT IS NOT CONTROLLED WITH YOUR NAUSEA MEDICATION  *UNUSUAL SHORTNESS OF BREATH  *UNUSUAL BRUISING OR BLEEDING  TENDERNESS IN MOUTH AND THROAT WITH OR WITHOUT PRESENCE OF ULCERS  *URINARY PROBLEMS  *BOWEL PROBLEMS  UNUSUAL RASH Items with * indicate a potential emergency and should be followed up as soon as possible.  Feel free to call the clinic should you have any questions or concerns. The clinic phone number is [phone].  Please show the [facility] at check-in to the Emergency Department and triage nurse.

## 2018-06-21 NOTE — Telephone Encounter (Signed)
Per MD - ok to treat today per lab results from 06/20/2018 with noted creatinine of 1.5.

## 2018-06-21 NOTE — Progress Notes (Signed)
Access done in Infusion area

## 2018-06-23 ENCOUNTER — Inpatient Hospital Stay: Payer: Managed Care, Other (non HMO)

## 2018-06-23 VITALS — BP 180/96 | HR 64 | Temp 97.9°F | Resp 16

## 2018-06-23 DIAGNOSIS — Z5111 Encounter for antineoplastic chemotherapy: Secondary | ICD-10-CM | POA: Diagnosis not present

## 2018-06-23 DIAGNOSIS — C50311 Malignant neoplasm of lower-inner quadrant of right female breast: Secondary | ICD-10-CM

## 2018-06-23 DIAGNOSIS — Z170 Estrogen receptor positive status [ER+]: Principal | ICD-10-CM

## 2018-06-23 MED ORDER — PEGFILGRASTIM-CBQV 6 MG/0.6ML ~~LOC~~ SOSY
6.0000 mg | PREFILLED_SYRINGE | Freq: Once | SUBCUTANEOUS | Status: AC
  Administered 2018-06-23: 6 mg via SUBCUTANEOUS

## 2018-07-01 ENCOUNTER — Other Ambulatory Visit: Payer: Self-pay | Admitting: Primary Care

## 2018-07-01 DIAGNOSIS — E119 Type 2 diabetes mellitus without complications: Secondary | ICD-10-CM

## 2018-07-03 ENCOUNTER — Other Ambulatory Visit: Payer: Self-pay

## 2018-07-03 ENCOUNTER — Inpatient Hospital Stay (HOSPITAL_BASED_OUTPATIENT_CLINIC_OR_DEPARTMENT_OTHER): Payer: Managed Care, Other (non HMO) | Admitting: Genetics

## 2018-07-03 ENCOUNTER — Encounter: Payer: Self-pay | Admitting: Genetics

## 2018-07-03 DIAGNOSIS — C50911 Malignant neoplasm of unspecified site of right female breast: Secondary | ICD-10-CM

## 2018-07-03 DIAGNOSIS — Z8042 Family history of malignant neoplasm of prostate: Secondary | ICD-10-CM | POA: Diagnosis not present

## 2018-07-03 DIAGNOSIS — Z803 Family history of malignant neoplasm of breast: Secondary | ICD-10-CM | POA: Insufficient documentation

## 2018-07-03 DIAGNOSIS — C50211 Malignant neoplasm of upper-inner quadrant of right female breast: Secondary | ICD-10-CM

## 2018-07-03 DIAGNOSIS — C50311 Malignant neoplasm of lower-inner quadrant of right female breast: Secondary | ICD-10-CM

## 2018-07-03 DIAGNOSIS — Z170 Estrogen receptor positive status [ER+]: Secondary | ICD-10-CM

## 2018-07-03 DIAGNOSIS — C73 Malignant neoplasm of thyroid gland: Secondary | ICD-10-CM

## 2018-07-03 DIAGNOSIS — Z801 Family history of malignant neoplasm of trachea, bronchus and lung: Secondary | ICD-10-CM | POA: Insufficient documentation

## 2018-07-03 DIAGNOSIS — Z85850 Personal history of malignant neoplasm of thyroid: Secondary | ICD-10-CM

## 2018-07-03 NOTE — Progress Notes (Signed)
REFERRING PROVIDER: Chauncey Cruel, MD [address] [place], [place] [address]  PRIMARY PROVIDER:  [name], NP  PRIMARY REASON FOR VISIT:  1. Malignant neoplasm of lower-inner quadrant of right breast of female, estrogen receptor positive ([place])   2. Family history of breast cancer   3. Family history of prostate cancer   4. Family history of lung cancer   5. Malignant neoplasm of upper-inner quadrant of right breast in female, estrogen receptor positive ([place])   6. Papillary thyroid carcinoma ([place])   7. Malignant neoplasm of right female breast, unspecified estrogen receptor status, unspecified site of breast ([place])      HISTORY OF PRESENT ILLNESS:   [name], a 52 y.o. female, was seen for a [facility] cancer genetics consultation at the request of Dr. [name] due to a personal and family history of cancer.  [name] presents to clinic today to discuss the possibility of a hereditary predisposition to cancer, genetic testing, and to further clarify her future cancer risks, as well as potential cancer risks for family members.   In July 2019, at the age of 79, [name] was diagnosed with Invasice ductal carcinoma ER+, PR+, HER2+ of the right breast. She is currently undergoing chemotherapy.  She has a history of papillary thyroid cancer in 2008.   CANCER HISTORY:    Malignant neoplasm of lower-inner quadrant of right breast of female, estrogen receptor positive ([place])   05/10/2018 Initial Diagnosis    Malignant neoplasm of lower-inner quadrant of right breast of female, estrogen receptor positive ([place])    05/30/2018 -  Chemotherapy    The patient had palonosetron (ALOXI) injection 0.25 mg, 0.25 mg, Intravenous,  Once, 2 of 6 cycles Administration: 0.25 mg (05/31/2018), 0.25 mg (06/21/2018) pegfilgrastim-cbqv (UDENYCA) injection 6 mg, 6 mg, Subcutaneous, Once, 2 of 6 cycles Administration: 6 mg (06/02/2018), 6 mg (06/23/2018) trastuzumab (HERCEPTIN) 900 mg in sodium  chloride 0.9 % 250 mL chemo infusion, 903 mg, Intravenous,  Once, 2 of 6 cycles Administration: 672 mg (06/21/2018), 900 mg (06/01/2018) CARBOplatin (PARAPLATIN) 580 mg in sodium chloride 0.9 % 250 mL chemo infusion, 580 mg (98.8 % of original dose 588 mg), Intravenous,  Once, 2 of 6 cycles Dose modification:   (original dose 588 mg, Cycle 1) Administration: 580 mg (05/31/2018), 500 mg (06/21/2018) DOCEtaxel (TAXOTERE) 170 mg in sodium chloride 0.9 % 250 mL chemo infusion, 75 mg/m2 = 170 mg, Intravenous,  Once, 2 of 6 cycles Administration: 170 mg (05/31/2018), 170 mg (06/21/2018) pertuzumab (PERJETA) 840 mg in sodium chloride 0.9 % 250 mL chemo infusion, 840 mg, Intravenous, Once, 1 of 1 cycle Administration: 840 mg (06/01/2018) fosaprepitant (EMEND) 150 mg, dexamethasone (DECADRON) 12 mg in sodium chloride 0.9 % 145 mL IVPB, , Intravenous,  Once, 2 of 6 cycles Administration:  (05/31/2018),  (06/21/2018)  for chemotherapy treatment.       HORMONAL RISK FACTORS:  Menarche was at age 58.  First live birth at age 83.  Ovaries intact: yes.  Hysterectomy: no.  Menopausal status: LMP 2017  HRT use: 0 years. Colonoscopy: no; not examined. Mammogram within the last year: yes.  Past Medical History:  Diagnosis Date  . Anemia    Normocytic  . Anxiety   . Asthma   . Blood dyscrasia   . Bronchitis 2005  . 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 Diabetes mellitus without complication ([place])   . Family history of breast cancer   . Family history of lung cancer   .  Family history of prostate cancer   . Gastroenteritis 07/10/07  . GERD 07/24/2006  . GRAVE'S DISEASE 01/01/2008  . History of hidradenitis suppurativa   . History of kidney stones   . History of thrush   . HIV DISEASE 07/24/2006   dx March 05  . HYPERTENSION 07/24/2006  . Hyperthyroidism 08/2006   Grave's Disease -diffuse radiotracer uptake 08/25/06 Thyroid scan-Cold nodule to R lower lobe of thyrorid  . Menometrorrhagia    hx of   . Nephrolithiasis   . 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 Pneumonia 2005  . 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 Sarcoidosis 02/08/2007   dx as a teenager in [place] from abnl CXR. Completed 2 yrs Prednisone after lung bx confirmation. No symptoms since then.  . Thyroid cancer ([place])   . THYROID NODULE, RIGHT 02/08/2007    Past Surgical History:  Procedure Laterality Date  . BREAST SURGERY  1997   Breast Reduction   . CYSTOSCOPY W/ URETERAL STENT REMOVAL  11/09/2012   Procedure: CYSTOSCOPY WITH STENT REMOVAL;  Surgeon: Alexis Frock, MD;  Location: [facility];  Service: Urology;  Laterality: Right;  . CYSTOSCOPY WITH RETROGRADE PYELOGRAM, URETEROSCOPY AND STENT PLACEMENT  11/09/2012   Procedure: CYSTOSCOPY WITH RETROGRADE PYELOGRAM, URETEROSCOPY AND STENT PLACEMENT;  Surgeon: Alexis Frock, MD;  Location: [facility];  Service: Urology;  Laterality: Left;  LEFT URETEROSCOPY, STONE MANIPULATION, left STENT exchange   . CYSTOSCOPY WITH STENT PLACEMENT  10/02/2012   Procedure: CYSTOSCOPY WITH STENT PLACEMENT;  Surgeon: Alexis Frock, MD;  Location: [facility];  Service: Urology;  Laterality: Left;  . DILATION AND CURETTAGE OF UTERUS  Feb 2004   s/p for 1st trimester nonviable pregnancy  . EYE SURGERY     sty under eyelid  . INCISE AND DRAIN ABCESS  Nov 03   s/p I &D for righ inframmary fold hidradenitis  . INCISION AND DRAINAGE PERITONSILLAR ABSCESS  Mar 03  . IR CV LINE INJECTION  06/07/2018  . IR IMAGING GUIDED PORT INSERTION  06/20/2018  . IR REMOVAL TUN ACCESS W/ PORT W/O FL MOD SED  06/20/2018  . IRRIGATION AND DEBRIDEMENT ABSCESS  01/31/2012   Procedure: IRRIGATION AND DEBRIDEMENT ABSCESS;  Surgeon: Shann Medal, MD;  Location: [facility];  Service: General;  Laterality: Right;  right breast and axilla   . NEPHROLITHOTOMY  10/02/2012   Procedure: NEPHROLITHOTOMY PERCUTANEOUS;  Surgeon: Alexis Frock, MD;  Location: [facility];   Service: Urology;  Laterality: Right;  First Stage Percutaneous Nephrolithotomy with Surgeon Access, Left Ureteral Stent    . NEPHROLITHOTOMY  10/04/2012   Procedure: NEPHROLITHOTOMY PERCUTANEOUS SECOND LOOK;  Surgeon: Alexis Frock, MD;  Location: [facility];  Service: Urology;  Laterality: Right;     . NEPHROLITHOTOMY  10/08/2012   Procedure: NEPHROLITHOTOMY PERCUTANEOUS;  Surgeon: Alexis Frock, MD;  Location: [facility];  Service: Urology;  Laterality: Right;  THIRD STAGE, nephrostomy tube exchange x 2  . NEPHROLITHOTOMY  10/11/2012   Procedure: NEPHROLITHOTOMY PERCUTANEOUS SECOND LOOK;  Surgeon: Alexis Frock, MD;  Location: [facility];  Service: Urology;  Laterality: Right;  RIGHT 4 STAGE PERCUTANOUS NEPHROLITHOTOMY, right URETEROSCOPY WITH HOLMIUM LASER   . PORTACATH PLACEMENT Left 05/17/2018   Procedure: INSERTION PORT-A-CATH;  Surgeon: Coralie Keens, MD;  Location: [facility];  Service: General;  Laterality: Left;  . RADICAL NECK DISSECTION  04/12/2017   limited/notes 04/12/2017  . RADICAL NECK DISSECTION N/A 04/12/2017   Procedure: RADICAL NECK DISSECTION;  Surgeon:  Melida Quitter, MD;  Location: [facility];  Service: ENT;  Laterality: N/A;  limited neck dissection 2 hours total  . REDUCTION MAMMAPLASTY Bilateral 1998  . [place]  1994  . THYROIDECTOMY  04/12/2017   completion/notes 04/12/2017  . THYROIDECTOMY N/A 04/12/2017   Procedure: THYROIDECTOMY;  Surgeon: Melida Quitter, MD;  Location: [facility];  Service: ENT;  Laterality: N/A;  Completion Thyroidectomy  . TOTAL THYROIDECTOMY  2010    Social History   Socioeconomic History  . Marital status: Single    Spouse name: Not on file  . Number of children: 1  . Years of education: Not on file  . Highest education level: Not on file  Occupational History  . Occupation: Secondary school teacher  Social Needs  . Financial resource strain: Not on file  . Food insecurity:    Worry: Not on file    Inability: Not on file  . Transportation  needs:    Medical: Not on file    Non-medical: Not on file  Tobacco Use  . Smoking status: Former Smoker    Packs/day: 0.50    Years: 15.00    Pack years: 7.50    Types: Cigarettes    Start date: 04/12/2017  . Smokeless tobacco: Never Used  Substance and Sexual Activity  . Alcohol use: Yes    Alcohol/week: 0.0 standard drinks    Comment: social  . Drug use: No  . Sexual activity: Not Currently    Birth control/protection: Post-menopausal    Comment: declined condoms  Lifestyle  . Physical activity:    Days per week: Not on file    Minutes per session: Not on file  . Stress: Not on file  Relationships  . Social connections:    Talks on phone: Not on file    Gets together: Not on file    Attends religious service: Not on file    Active member of club or organization: Not on file    Attends meetings of clubs or organizations: Not on file    Relationship status: Not on file  Other Topics Concern  . Not on file  Social History Narrative  . Not on file     FAMILY HISTORY:  We obtained a detailed, 4-generation family history.  Significant diagnoses are listed below: Family History  Problem Relation Age of Onset  . Hypertension Mother   . Cancer Mother        laryngeal  . Heart disease Mother        stent  . Hypertension Father   . Lung cancer Father 47       hx smoking  . Heart disease Other   . Hypertension Other   . Stroke Other        Grandparent  . Kidney disease Other        Grandparent  . Diabetes Other        FH of Diabetes  . Hypertension Sister   . Cancer Maternal Uncle        Lung CA  . Hypertension Brother   . Hypertension Sister   . Breast cancer Maternal Aunt 65  . Breast cancer Paternal Aunt 107  . Prostate cancer Paternal Uncle   . Breast cancer Maternal Aunt        dx 60+  . Breast cancer Paternal Aunt        dx 68's  . Breast cancer Paternal Aunt        dx 60's  . Prostate cancer Paternal Uncle   .  Lung cancer Paternal Uncle   . Breast  cancer Cousin 59  . Breast cancer Cousin        dx <50  . Breast cancer Cousin        dx <50  . Breast cancer Cousin        dx <50    [name] has a 66 year-old daughter with no history of cancer.  [name] has 2 maternal half sisters (69 and 71) and a maternal half brother who is 91 with no history of cancer.   [name] father: died at 28 due to lung cancer.  He had a hx of smoking.  Paternal aunts/Uncles: 3 paternal aunts, 3 paternal uncles: -1 paternal uncle had lung cancer -2 paternal uncles had prostate cancer -3 paternal aunts had breast cancer dx in their 31's Paternal cousins: 3 paternal cousins with breast cancer dx under 79 (they are not siblings of each other) Paternal grandfather: unk Paternal grandmother:unk  [name]'s mother: 67, hx of laryngeal cancer Maternal Aunts/Uncles: 2 maternal aunts with breast caner dx 60's+, 1 maternal uncle with lung cancer, 9 other maternal aunts/uncles with no history of cancer.  Maternal cousins: 1 maternal cousin is currently being treated for breast cancer- she is 51.  Maternal grandfather: no history of cancer Maternal grandmother:no history of cancer  Ms. Saintvil is unaware of previous family history of genetic testing for hereditary cancer risks. Patient's maternal ancestors are of African American descent, and paternal ancestors are of African American descent. There is no reported Ashkenazi Jewish ancestry. There is no known consanguinity.  GENETIC COUNSELING ASSESSMENT: [name] is a 52 y.o. female with a personal and family history which is somewhat suggestive of a Hereditary Cancer Predisposition Syndrome. We, therefore, discussed and recommended the following at today's visit.   DISCUSSION: We reviewed the characteristics, features and inheritance patterns of hereditary cancer syndromes. We also discussed genetic testing, including the appropriate family members to test, the process of testing, insurance coverage and  turn-around-time for results. We discussed the implications of a negative, positive and/or variant of uncertain significant result. We recommended [name] pursue genetic testing for the CancerNext gene panel.   The CancerNext gene panel offered by [employer] includes sequencing and rearrangement analysis for the following 32 genes:   APC, ATM, BARD1, BMPR1A, BRCA1, BRCA2, BRIP1, CDH1, CDK4, CDKN2A, CHEK2, DICER1, EPCAM, GREM1, HOXB13MLH1, MRE11A, MSH2, MSH6, MUTYH, NBN, NF1, PALB2, PMS2, POLD1, POLE, PTEN, RAD50, RAD51D, SMAD4, SMARCA4, STK11, and TP53.   We discussed that only 5-10% of cancers are associated with a Hereditary cancer predisposition syndrome.  One of the most common hereditary cancer syndromes that increases breast cancer risk is called Hereditary Breast and Ovarian Cancer (HBOC) syndrome.  This syndrome is caused by mutations in the BRCA1 and BRCA2 genes.  This syndrome increases an individual's lifetime risk to develop breast, ovarian, pancreatic, and other types of cancer.  There are also many other cancer predisposition syndromes caused by mutations in several other genes.  We discussed that if she is found to have a mutation in one of these genes, it may impact surgical decisions, and alter future medical management recommendations such as increased cancer screenings and consideration of risk reducing surgeries.  A positive result could also have implications for the patient's family members.  A Negative result would mean we were unable to identify a hereditary component to her cancer, but does not rule out the possibility of a hereditary basis for her cancer.  There could be mutations that are undetectable by current technology, or in genes not yet tested or identified to increase cancer risk.    We discussed the potential to find a Variant of Uncertain Significance or VUS.  These are variants that have not yet been identified as pathogenic or benign, and it is unknown if this  variant is associated with increased cancer risk or if this is a normal finding.  Most VUS's are reclassified to benign or likely benign.   It should not be used to make medical management decisions. With time, we suspect the lab will determine the significance of any VUS's identified if any.   Based on [name]'s personal and family history of cancer, she meets medical criteria for genetic testing. Despite that she meets criteria, she may still have an out of pocket cost. The laboratory can provide her with an estimate of her OOP cost. she was given the contact information of the laboratory if she has further questions.   PLAN: After considering the risks, benefits, and limitations, [name]  provided informed consent to pursue genetic testing.  A blood sample will be obtained at her next infusion/lab appointment and will be sent to [facility] for analysis of the CancerNext Panel + RNA insight. Results should be available within approximately 2-3 weeks' time, at which point they will be disclosed by telephone to [name], as will any additional recommendations warranted by these results. [name] will receive a summary of her genetic counseling visit and a copy of her results once available. This information will also be available in Epic. We encouraged [name] to remain in contact with cancer genetics annually so that we can continuously update the family history and inform her of any changes in cancer genetics and testing that may be of benefit for her family. [name] questions were answered to her satisfaction today. Our contact information was provided should additional questions or concerns arise.  Based on [name]'s family history, we recommended her maternal and paternal relatives (especially those affected with cancer) also have genetic counseling and testing. [name] will let us know if we can be of any assistance in coordinating genetic counseling and/or testing for  this family member.   Lastly, we encouraged Ms. Safi to remain in contact with cancer genetics annually so that we can continuously update the family history and inform her of any changes in cancer genetics and testing that may be of benefit for this family.   Ms.  Beynon questions were answered to her satisfaction today. Our contact information was provided should additional questions or concerns arise. Thank you for the referral and allowing [place] to share in the care of your patient.   Tana Felts, MS, [facility] Certified Genetic Counselor [email or web address] phone: [phone]  The patient was seen for a total of 30 minutes in face-to-face genetic counseling.  This patient was discussed with Drs. Magrinat, Lindi Adie and/or Burr Medico who agrees with the above.

## 2018-07-09 ENCOUNTER — Encounter: Payer: Managed Care, Other (non HMO) | Admitting: Genetics

## 2018-07-10 ENCOUNTER — Other Ambulatory Visit: Payer: Self-pay | Admitting: Primary Care

## 2018-07-10 DIAGNOSIS — E890 Postprocedural hypothyroidism: Secondary | ICD-10-CM

## 2018-07-11 ENCOUNTER — Ambulatory Visit (INDEPENDENT_AMBULATORY_CARE_PROVIDER_SITE_OTHER): Payer: Managed Care, Other (non HMO) | Admitting: Internal Medicine

## 2018-07-11 ENCOUNTER — Encounter: Payer: Self-pay | Admitting: Internal Medicine

## 2018-07-11 DIAGNOSIS — B20 Human immunodeficiency virus [HIV] disease: Secondary | ICD-10-CM

## 2018-07-11 DIAGNOSIS — Z23 Encounter for immunization: Secondary | ICD-10-CM

## 2018-07-11 DIAGNOSIS — L732 Hidradenitis suppurativa: Secondary | ICD-10-CM

## 2018-07-11 DIAGNOSIS — F330 Major depressive disorder, recurrent, mild: Secondary | ICD-10-CM | POA: Diagnosis not present

## 2018-07-11 NOTE — Telephone Encounter (Signed)
Last prescribed on 03/10/2018 Last office visit on 12/29/2017

## 2018-07-11 NOTE — Assessment & Plan Note (Signed)
I talked to her about options for her chronic hidradenitis including eventual surgical excision of affected areas after she completes therapy for breast cancer.  Stating that she wants to continue taking doxycycline for now.

## 2018-07-11 NOTE — Progress Notes (Signed)
Patient Active Problem List   Diagnosis Date Noted  . Human immunodeficiency virus (HIV) disease ([place]) 07/24/2006    Priority: High  . Depression 07/24/2006    Priority: High  . Hidradenitis suppurativa of right >> left axilla 01/30/2012    Priority: Medium  . 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 Cigarette smoker 02/08/2007    Priority: Medium  . Essential hypertension 07/24/2006    Priority: Medium  . Asthma 07/24/2006    Priority: Medium  . Family history of breast cancer   . Family history of prostate cancer   . Family history of lung cancer   . Malignant neoplasm of lower-inner quadrant of right breast of female, estrogen receptor positive ([place]) 05/10/2018  . DOE (dyspnea on exertion) 10/13/2017  . Hyperlipidemia 04/26/2017  . Chronic anxiety 04/11/2017  . Upper airway cough syndrome 11/11/2016  . Morbid (severe) obesity due to excess calories ([place]) 10/14/2016  . Shortness of breath 10/13/2016  . Allergic rhinitis 02/18/2016  . Right shoulder pain 02/18/2016  . Type 2 diabetes mellitus ([place]) 06/23/2015  . Renal calculi 10/29/2014  . Recurrent boils 06/11/2014  . Abscess of breast, right 01/09/2013  . SARCOIDOSIS 02/08/2007  . GERD 07/24/2006    Patient's Medications  New Prescriptions   No medications on file  Previous Medications   AMLODIPINE (NORVASC) 10 MG TABLET    Take 1 tablet (10 mg total) by mouth daily.   ATORVASTATIN (LIPITOR) 20 MG TABLET    Take 1 tablet by mouth every evening for high cholesterol. NEED APPOINTMENT FOR ANY MORE REFILLS   DEXAMETHASONE (DECADRON) 4 MG TABLET    Take 2 tablets (8 mg total) by mouth 2 (two) times daily. Start the day before Taxotere. Take once the day after, then 2 times a day x 2d.   DOLUTEGRAVIR ([name]) 50 MG TABLET    Take 1 tablet (50 mg total) by mouth daily.   DOXYCYCLINE (VIBRA-TABS) 100 MG TABLET    Take 1 tablet (100 mg total) by mouth 2 (two) times daily.   DOXYCYCLINE  (VIBRA-TABS) 100 MG TABLET    Take 1 tablet (100 mg total) by mouth daily.   EMTRICITABINE-TENOFOVIR AF (DESCOVY) 200-25 MG TABLET    Take 1 tablet by mouth daily.   GLUCOSE BLOOD (ONETOUCH VERIO) TEST STRIP    Use as instructed to test blood sugar daily   HYDRALAZINE (APRESOLINE) 25 MG TABLET    TAKE 1 TABLET BY MOUTH THREE TIMES A DAY   IRBESARTAN-HYDROCHLOROTHIAZIDE (AVALIDE) 150-12.5 MG TABLET    TAKE 1 TABLET BY MOUTH ONCE DAILY FOR BLOOD PRESSURE.   LEVOTHYROXINE (SYNTHROID, LEVOTHROID) 175 MCG TABLET    TAKE 1 TABLET BY MOUTH EVERY MORNING ON AN EMPTY STOMACH WITH A FULL GLASS OF WATER.   LIDOCAINE-PRILOCAINE (EMLA) CREAM    Apply to affected area once   LORAZEPAM (ATIVAN) 0.5 MG TABLET    Take 1 tablet (0.5 mg total) by mouth at bedtime as needed (Nausea or vomiting).   METFORMIN (GLUCOPHAGE) 500 MG TABLET    TAKE 1 TABLET BY MOUTH TWICE A DAY WITH A MEAL (DUE FOR APPT FOR ADDITIONAL REFILLS)   METOPROLOL SUCCINATE (TOPROL-XL) 50 MG 24 HR TABLET    TAKE 1 TABLET (50 MG TOTAL) BY MOUTH DAILY. TAKE WITH OR IMMEDIATELY FOLLOWING A MEAL.   MOMETASONE (NASONEX) 50 MCG/ACT NASAL SPRAY    PLACE 2 SPRAYS INTO THE NOSE DAILY.   MULTIPLE VITAMIN (  MULTIVITAMIN) TABLET    Take 1 tablet by mouth daily.   ONE TOUCH LANCETS MISC    Use as instructed to test blood sugar daily   OXYCODONE (OXY IR/ROXICODONE) 5 MG IMMEDIATE RELEASE TABLET    Take 1 tablet (5 mg total) by mouth every 6 (six) hours as needed for moderate pain or severe pain.   PANTOPRAZOLE (PROTONIX) 40 MG TABLET    Take 40 mg by mouth daily as needed (for indigestion/acid reflux.).    PROAIR RESPICLICK 962 (90 BASE) MCG/ACT AEPB    INHALE 2 PUFFS INTO THE LUNGS EVERY 6 HOURS AS NEEDED FOR WHEEZING OR SHORTNESS OF BREATH.   PROCHLORPERAZINE (COMPAZINE) 10 MG TABLET    Take 1 tablet (10 mg total) by mouth every 6 (six) hours as needed (Nausea or vomiting).   TRAMADOL (ULTRAM) 50 MG TABLET    Take 1 tablet (50 mg total) by mouth every 6 (six)  hours as needed.  Modified Medications   No medications on file  Discontinued Medications   No medications on file    Subjective: [name] is in for her routine HIV follow-up visit.  She has had no problems obtaining, taking or tolerating her Descovy and [name] but she does recall missing 2 doses of the [name].  She uses her pillbox but tries to not take all of her medications at once.  For some reason this is caused her to miss [name] on 2 occasions.  She started her chemotherapy for breast cancer in early August.  She had severe diarrhea on her initial regimen but this resolved after her regimen was changed recently.  She continues to be bothered by her chronic hidradenitis and boils under her breast.  She continues to take suppressive doxycycline but has not noticed any worsening or any improvement.  When I asked her about her mood she says she continues to have good days and bad days.  Review of Systems: Review of Systems  Constitutional: Positive for malaise/fatigue. Negative for chills, diaphoresis and fever.  Respiratory: Negative for cough, sputum production and shortness of breath.   Cardiovascular: Negative for chest pain.  Gastrointestinal: Negative for abdominal pain, diarrhea, nausea and vomiting.  Genitourinary: Negative for dysuria.  Skin:       As noted in HPI  Psychiatric/Behavioral: Positive for depression.    Past Medical History:  Diagnosis Date  . Anemia    Normocytic  . Anxiety   . Asthma   . Blood dyscrasia   . Bronchitis 2005  . 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 Diabetes mellitus without complication ([place])   . Family history of breast cancer   . Family history of lung cancer   . Family history of prostate cancer   . Gastroenteritis 07/10/07  . GERD 07/24/2006  . GRAVE'S DISEASE 01/01/2008  . History of hidradenitis suppurativa   . History of kidney stones   . History of thrush   . HIV DISEASE 07/24/2006   dx March 05  . HYPERTENSION  07/24/2006  . Hyperthyroidism 08/2006   Grave's Disease -diffuse radiotracer uptake 08/25/06 Thyroid scan-Cold nodule to R lower lobe of thyrorid  . Menometrorrhagia    hx of  . Nephrolithiasis   . 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 Pneumonia 2005  . 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 Sarcoidosis 02/08/2007   dx as a teenager in [place] from abnl CXR. Completed 2 yrs Prednisone after lung bx confirmation. No symptoms since then.  Marland Kitchen  Thyroid cancer ([place])   . THYROID NODULE, RIGHT 02/08/2007    Social History   Tobacco Use  . Smoking status: Former Smoker    Packs/day: 0.50    Years: 15.00    Pack years: 7.50    Types: Cigarettes    Start date: 04/12/2017  . Smokeless tobacco: Never Used  Substance Use Topics  . Alcohol use: Yes    Alcohol/week: 0.0 standard drinks    Comment: social  . Drug use: No    Family History  Problem Relation Age of Onset  . Hypertension Mother   . Cancer Mother        laryngeal  . Heart disease Mother        stent  . Hypertension Father   . Lung cancer Father 51       hx smoking  . Heart disease Other   . Hypertension Other   . Stroke Other        Grandparent  . Kidney disease Other        Grandparent  . Diabetes Other        FH of Diabetes  . Hypertension Sister   . Cancer Maternal Uncle        Lung CA  . Hypertension Brother   . Hypertension Sister   . Breast cancer Maternal Aunt 65  . Breast cancer Paternal Aunt 10  . Prostate cancer Paternal Uncle   . Breast cancer Maternal Aunt        dx 60+  . Breast cancer Paternal Aunt        dx 17's  . Breast cancer Paternal Aunt        dx 80's  . Prostate cancer Paternal Uncle   . Lung cancer Paternal Uncle   . Breast cancer Cousin 66  . Breast cancer Cousin        dx <50  . Breast cancer Cousin        dx <50  . Breast cancer Cousin        dx <50    Allergies  Allergen Reactions  . Tape Rash    Rash     . Genvoya [Elviteg-Cobic-Emtricit-Tenofaf] Hives  . Lisinopril Cough  . Xylocaine [Lidocaine]     Unknown , patient states she has had lidocaine for IV starts and at the dentist with no problems  . Valsartan Cough    Pt states she tolerates medicine now    Health Maintenance  Topic Date Due  . FOOT EXAM  05/14/1978  . PAP SMEAR  01/28/2014  . OPHTHALMOLOGY EXAM  07/15/2016  . HEMOGLOBIN A1C  10/05/2017  . COLONOSCOPY  05/14/2018  . INFLUENZA VACCINE  08/31/2018 (Originally 05/17/2018)  . TETANUS/TDAP  10/18/2019  . MAMMOGRAM  04/20/2020  . PNEUMOCOCCAL POLYSACCHARIDE VACCINE AGE 55-64 HIGH RISK  Completed  . HIV Screening  Completed    Objective:  Vitals:   07/11/18 1005  Temp: 98.1 F (36.7 C)  Weight: 235 lb (106.6 kg)  Height: 5\' 5"  (1.651 m)   Body mass index is 39.11 kg/m.  Physical Exam  Constitutional: She is oriented to person, place, and time.  She is smiling and in good spirits.  HENT:  Mouth/Throat: No oropharyngeal exudate.  Alopecia  Cardiovascular: Normal rate, regular rhythm and normal heart sounds.  Pulmonary/Chest: Effort normal and breath sounds normal.  She had her Port-A-Cath replaced recently because her initial one was not long enough for chemotherapy.  Neurological: She is alert and oriented to person,  place, and time.  Psychiatric: She has a normal mood and affect.    Lab Results Lab Results  Component Value Date   WBC 9.9 06/20/2018   HGB 10.4 (L) 06/20/2018   HCT 32.9 (L) 06/20/2018   MCV 87.3 06/20/2018   PLT 418 (H) 06/20/2018    Lab Results  Component Value Date   CREATININE 1.58 (H) 06/20/2018   BUN 18 06/20/2018   NA 142 06/20/2018   K 3.7 06/20/2018   CL 110 06/20/2018   CO2 23 06/20/2018    Lab Results  Component Value Date   ALT 48 (H) 06/06/2018   AST 38 06/06/2018   ALKPHOS 145 (H) 06/06/2018   BILITOT 0.4 06/06/2018    Lab Results  Component Value Date   CHOL 117 10/23/2017   HDL 33 (L) 10/23/2017    LDLCALC 61 10/23/2017   TRIG 149 10/23/2017   CHOLHDL 3.5 10/23/2017   Lab Results  Component Value Date   LABRPR NON-REACTIVE 10/23/2017   HIV 1 RNA Quant (copies/mL)  Date Value  05/28/2018 <20 NOT DETECTED  10/23/2017 51 (H)  03/20/2017 <20 NOT DETECTED   CD4 T Cell Abs (/uL)  Date Value  05/28/2018 1,250  10/23/2017 1,250  03/20/2017 1,520     Problem List Items Addressed This Visit      High   Human immunodeficiency virus (HIV) disease ([place])    I will recheck her CD4 count and viral load today now that she has been on chemotherapy for nearly 2 months.  She will continue her current antiretroviral regimen and follow-up in 6 weeks.  I gave her an a.m. and p.m. pillbox to help organize her medications.  She will follow-up in 6 weeks.      Relevant Orders   T-helper cell (CD4)- ([facility] clinic only)   HIV-1 RNA quant-no reflex-bld   Depression    Her depression is unchanged and in partial remission.  She did not want to meet with our counselors today.        Medium   Hidradenitis suppurativa of right >> left axilla    I talked to her about options for her chronic hidradenitis including eventual surgical excision of affected areas after she completes therapy for breast cancer.  Stating that she wants to continue taking doxycycline for now.           Michel Bickers, MD [facility] [phone] pager   [phone] cell 07/11/2018, 10:28 AM

## 2018-07-11 NOTE — Assessment & Plan Note (Signed)
I will recheck her CD4 count and viral load today now that she has been on chemotherapy for nearly 2 months.  She will continue her current antiretroviral regimen and follow-up in 6 weeks.  I gave her an a.m. and p.m. pillbox to help organize her medications.  She will follow-up in 6 weeks.

## 2018-07-11 NOTE — Assessment & Plan Note (Signed)
Her depression is unchanged and in partial remission.  She did not want to meet with our counselors today.

## 2018-07-11 NOTE — Telephone Encounter (Signed)
She needs an office visit with me for repeat TSH and follow up. Please schedule lab appointment prior to office visit. Will allot one refill until she's seen.

## 2018-07-12 ENCOUNTER — Telehealth: Payer: Self-pay | Admitting: Adult Health

## 2018-07-12 ENCOUNTER — Inpatient Hospital Stay: Payer: Managed Care, Other (non HMO)

## 2018-07-12 ENCOUNTER — Other Ambulatory Visit: Payer: Self-pay

## 2018-07-12 ENCOUNTER — Inpatient Hospital Stay (HOSPITAL_BASED_OUTPATIENT_CLINIC_OR_DEPARTMENT_OTHER): Payer: Managed Care, Other (non HMO) | Admitting: Adult Health

## 2018-07-12 ENCOUNTER — Encounter: Payer: Self-pay | Admitting: Adult Health

## 2018-07-12 ENCOUNTER — Ambulatory Visit: Payer: Self-pay | Admitting: Adult Health

## 2018-07-12 VITALS — BP 157/98 | HR 68 | Temp 97.9°F | Resp 18 | Ht 65.0 in | Wt 234.0 lb

## 2018-07-12 DIAGNOSIS — Z170 Estrogen receptor positive status [ER+]: Principal | ICD-10-CM

## 2018-07-12 DIAGNOSIS — C50211 Malignant neoplasm of upper-inner quadrant of right female breast: Secondary | ICD-10-CM | POA: Diagnosis not present

## 2018-07-12 DIAGNOSIS — C73 Malignant neoplasm of thyroid gland: Secondary | ICD-10-CM

## 2018-07-12 DIAGNOSIS — Z792 Long term (current) use of antibiotics: Secondary | ICD-10-CM

## 2018-07-12 DIAGNOSIS — C50311 Malignant neoplasm of lower-inner quadrant of right female breast: Secondary | ICD-10-CM

## 2018-07-12 DIAGNOSIS — L732 Hidradenitis suppurativa: Secondary | ICD-10-CM | POA: Diagnosis not present

## 2018-07-12 DIAGNOSIS — Z95828 Presence of other vascular implants and grafts: Secondary | ICD-10-CM

## 2018-07-12 DIAGNOSIS — E876 Hypokalemia: Secondary | ICD-10-CM

## 2018-07-12 DIAGNOSIS — Z5111 Encounter for antineoplastic chemotherapy: Secondary | ICD-10-CM | POA: Diagnosis not present

## 2018-07-12 HISTORY — DX: Presence of other vascular implants and grafts: Z95.828

## 2018-07-12 LAB — CBC WITH DIFFERENTIAL/PLATELET
BASOS ABS: 0 10*3/uL (ref 0.0–0.1)
Basophils Relative: 1 %
Eosinophils Absolute: 0.1 10*3/uL (ref 0.0–0.5)
Eosinophils Relative: 1 %
HEMATOCRIT: 29.5 % — AB (ref 34.8–46.6)
HEMOGLOBIN: 9.9 g/dL — AB (ref 11.6–15.9)
LYMPHS PCT: 34 %
Lymphs Abs: 2.3 10*3/uL (ref 0.9–3.3)
MCH: 28.8 pg (ref 25.1–34.0)
MCHC: 33.6 g/dL (ref 31.5–36.0)
MCV: 85.6 fL (ref 79.5–101.0)
MONO ABS: 0.7 10*3/uL (ref 0.1–0.9)
MONOS PCT: 11 %
NEUTROS ABS: 3.6 10*3/uL (ref 1.5–6.5)
NEUTROS PCT: 53 %
Platelets: 330 10*3/uL (ref 145–400)
RBC: 3.44 MIL/uL — ABNORMAL LOW (ref 3.70–5.45)
RDW: 20.5 % — ABNORMAL HIGH (ref 11.2–14.5)
WBC: 6.8 10*3/uL (ref 3.9–10.3)

## 2018-07-12 LAB — COMPREHENSIVE METABOLIC PANEL
ALBUMIN: 3.1 g/dL — AB (ref 3.5–5.0)
ALT: 13 U/L (ref 0–44)
AST: 13 U/L — ABNORMAL LOW (ref 15–41)
Alkaline Phosphatase: 109 U/L (ref 38–126)
Anion gap: 10 (ref 5–15)
BUN: 21 mg/dL — ABNORMAL HIGH (ref 6–20)
CO2: 25 mmol/L (ref 22–32)
Calcium: 9 mg/dL (ref 8.9–10.3)
Chloride: 107 mmol/L (ref 98–111)
Creatinine, Ser: 1.46 mg/dL — ABNORMAL HIGH (ref 0.44–1.00)
GFR calc non Af Amer: 41 mL/min — ABNORMAL LOW (ref >60)
GFR, EST AFRICAN AMERICAN: 47 mL/min — AB (ref >60)
GLUCOSE: 127 mg/dL — AB (ref 70–99)
POTASSIUM: 3 mmol/L — AB (ref 3.5–5.1)
SODIUM: 142 mmol/L (ref 135–145)
Total Bilirubin: 0.2 mg/dL — ABNORMAL LOW (ref 0.3–1.2)
Total Protein: 7.6 g/dL (ref 6.5–8.1)

## 2018-07-12 LAB — T-HELPER CELL (CD4) - (RCID CLINIC ONLY)
CD4 T CELL ABS: 1070 /uL (ref 400–2700)
CD4 T CELL HELPER: 42 % (ref 33–55)

## 2018-07-12 MED ORDER — SODIUM CHLORIDE 0.9 % IV SOLN
Freq: Once | INTRAVENOUS | Status: AC
  Administered 2018-07-12: 11:00:00 via INTRAVENOUS
  Filled 2018-07-12: qty 5

## 2018-07-12 MED ORDER — SODIUM CHLORIDE 0.9 % IV SOLN
Freq: Once | INTRAVENOUS | Status: AC
  Administered 2018-07-12: 11:00:00 via INTRAVENOUS
  Filled 2018-07-12: qty 250

## 2018-07-12 MED ORDER — DIPHENHYDRAMINE HCL 25 MG PO CAPS
50.0000 mg | ORAL_CAPSULE | Freq: Once | ORAL | Status: AC
  Administered 2018-07-12: 50 mg via ORAL

## 2018-07-12 MED ORDER — PALONOSETRON HCL INJECTION 0.25 MG/5ML
0.2500 mg | Freq: Once | INTRAVENOUS | Status: AC
  Administered 2018-07-12: 0.25 mg via INTRAVENOUS

## 2018-07-12 MED ORDER — SODIUM CHLORIDE 0.9 % IV SOLN
531.0000 mg | Freq: Once | INTRAVENOUS | Status: AC
  Administered 2018-07-12: 530 mg via INTRAVENOUS
  Filled 2018-07-12: qty 53

## 2018-07-12 MED ORDER — HEPARIN SOD (PORK) LOCK FLUSH 100 UNIT/ML IV SOLN
500.0000 [IU] | Freq: Once | INTRAVENOUS | Status: AC | PRN
  Administered 2018-07-12: 500 [IU]
  Filled 2018-07-12: qty 5

## 2018-07-12 MED ORDER — POTASSIUM CHLORIDE ER 10 MEQ PO TBCR: 20.0000 meq | EXTENDED_RELEASE_TABLET | Freq: Two times a day (BID) | ORAL | 0 refills | Status: DC

## 2018-07-12 MED ORDER — DIPHENHYDRAMINE HCL 25 MG PO CAPS
ORAL_CAPSULE | ORAL | Status: AC
  Filled 2018-07-12: qty 2

## 2018-07-12 MED ORDER — SODIUM CHLORIDE 0.9% FLUSH
10.0000 mL | INTRAVENOUS | Status: DC | PRN
  Administered 2018-07-12: 10 mL
  Filled 2018-07-12: qty 10

## 2018-07-12 MED ORDER — PALONOSETRON HCL INJECTION 0.25 MG/5ML
INTRAVENOUS | Status: AC
  Filled 2018-07-12: qty 5

## 2018-07-12 MED ORDER — SODIUM CHLORIDE 0.9 % IV SOLN
75.0000 mg/m2 | Freq: Once | INTRAVENOUS | Status: AC
  Administered 2018-07-12: 170 mg via INTRAVENOUS
  Filled 2018-07-12: qty 17

## 2018-07-12 MED ORDER — TRASTUZUMAB CHEMO 150 MG IV SOLR
6.0000 mg/kg | Freq: Once | INTRAVENOUS | Status: AC
  Administered 2018-07-12: 672 mg via INTRAVENOUS
  Filled 2018-07-12: qty 32

## 2018-07-12 MED ORDER — ACETAMINOPHEN 325 MG PO TABS
650.0000 mg | ORAL_TABLET | Freq: Once | ORAL | Status: AC
  Administered 2018-07-12: 650 mg via ORAL

## 2018-07-12 MED ORDER — ACETAMINOPHEN 325 MG PO TABS
ORAL_TABLET | ORAL | Status: AC
  Filled 2018-07-12: qty 2

## 2018-07-12 NOTE — Telephone Encounter (Signed)
Gave pt avs and calendar with appts per 9/26 los. Put on 10/2 due to availability.

## 2018-07-12 NOTE — Progress Notes (Signed)
[facility]  Telephone:(336) [phone] Fax:(336) [phone]     ID: [name] DOB: November 22, 1965  MR#: [id number]  LPF#:[id number]  Patient Care Team: [name], NP as PCP - General (Internal Medicine) Michel Bickers, MD as PCP - Infectious Diseases (Infectious Diseases) Magrinat, Virgie Dad, MD as Consulting Physician (Oncology) Kyung Rudd, MD as Consulting Physician (Radiation Oncology) Coralie Keens, MD as Consulting Physician (General Surgery) OTHER MD:   CHIEF COMPLAINT: Triple positive breast cancer  CURRENT TREATMENT: Neoadjuvant chemotherapy  INTERVAL HISTORY: [name] returns today for follow up and treatment of her triple positive breast cancer, accompanied by her sister, Ivin Booty. She receives docetaxel, carboplatin, trastuzumab, and pertuzumab every 21 days, with today being day 1 cycle 3. She also receives [place] on day 3 of every cycle. We have omitted Pertuzumab due to diarrhea and will consider adding it back with cycle 4.    REVIEW OF SYSTEMS: [name] is doing well today.  She notes that she is feeling well and is happy with her port revision.  Her breast sometimes has pulsating pain.  She has noted some tingling in her heels bilaterally.  She says this is intermittent and started 2 weeks ago after her appointment with Dr. [name].  She notes that it has been resolved x 2-3 days.  She notes nothing in her fingertips and toes.  Otherwise Iza is doing well.  She isn't having any fevers, chills, or headaches.  She deneis cough, chest pain, palpitations, shortness of breath.  She doesn't note any nausea, vomiting, or abdominal concerns.  She continues on Doxycycline for her boils and notes that she has 3 right now that she has dressed with gauze and tape and they are healing.  A detailed ROS was otherwise  Non contributory.  HISTORY OF CURRENT ILLNESS: From the original intake note:  [name] (pronounced "Huff") palpated a mass in the right  breast for about 2 weeks before bringing it to medical attention. She underwent bilateral diagnostic mammography with tomography and right breast ultrasonography at [facility] on 04/20/2018 showing: breast density category B. There was a highly suspicious mass located at 3 o'clock in the upper inner quadrant measuring 2.9 x 2.5 x 1.6 cm and 4 cm from the nipple. Ultrasound of the right axilla showed 5 lymph nodes with abnormal cortical thickening. The left axilla showed 3 lymph nodes with abnormal cortical thickening.  Accordingly on 04/26/2018 she proceeded to biopsy of the right breast area in question. The pathology from this procedure showed ([id number]): Invasive ductal carcinoma, grade II with calcifications. One right axillary lymph node and one left axillary lymph node were negative for carcinoma. Prognostic indicators significant for: estrogen receptor, 100% positive and progesterone receptor, 80% positive, both with strong staining intensity. Proliferation marker Ki67 at 70%. HER2 amplified with ratios HER2/CEP17 signals 4.52 and average HER2 copies per cell 10.85  The patient's subsequent history is as detailed below.    PAST MEDICAL HISTORY: Past Medical History:  Diagnosis Date  . Anemia    Normocytic  . Anxiety   . Asthma   . Blood dyscrasia   . Bronchitis 2005  . 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 Diabetes mellitus without complication ([place])   . Family history of breast cancer   . Family history of lung cancer   . Family history of prostate cancer   . Gastroenteritis 07/10/07  . GERD 07/24/2006  . GRAVE'S DISEASE 01/01/2008  . History of hidradenitis suppurativa   . History of kidney  stones   . History of thrush   . HIV DISEASE 07/24/2006   dx March 05  . HYPERTENSION 07/24/2006  . Hyperthyroidism 08/2006   Grave's Disease -diffuse radiotracer uptake 08/25/06 Thyroid scan-Cold nodule to R lower lobe of thyrorid  . Menometrorrhagia    hx of  .  Nephrolithiasis   . 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 Pneumonia 2005  . 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 Sarcoidosis 02/08/2007   dx as a teenager in [place] from abnl CXR. Completed 2 yrs Prednisone after lung bx confirmation. No symptoms since then.  . Thyroid cancer ([place])   . THYROID NODULE, RIGHT 02/08/2007  -2018 thyroid nodules were precancerous but pathology was negative for thyroid cancer.   PAST SURGICAL HISTORY: Past Surgical History:  Procedure Laterality Date  . BREAST SURGERY  1997   Breast Reduction   . CYSTOSCOPY W/ URETERAL STENT REMOVAL  11/09/2012   Procedure: CYSTOSCOPY WITH STENT REMOVAL;  Surgeon: Alexis Frock, MD;  Location: [facility];  Service: Urology;  Laterality: Right;  . CYSTOSCOPY WITH RETROGRADE PYELOGRAM, URETEROSCOPY AND STENT PLACEMENT  11/09/2012   Procedure: CYSTOSCOPY WITH RETROGRADE PYELOGRAM, URETEROSCOPY AND STENT PLACEMENT;  Surgeon: Alexis Frock, MD;  Location[facility];  Service: Urology;  Laterality: Left;  LEFT URETEROSCOPY, STONE MANIPULATION, left STENT exchange   . CYSTOSCOPY WITH STENT PLACEMENT  10/02/2012   Procedure: CYSTOSCOPY WITH STENT PLACEMENT;  Surgeon: Alexis Frock, MD;  Location[facility];  Service: Urology;  Laterality: Left;  . DILATION AND CURETTAGE OF UTERUS  Feb 2004   s/p for 1st trimester nonviable pregnancy  . EYE SURGERY     sty under eyelid  . INCISE AND DRAIN ABCESS  Nov 03   s/p I &D for righ inframmary fold hidradenitis  . INCISION AND DRAINAGE PERITONSILLAR ABSCESS  Mar 03  . IR CV LINE INJECTION  06/07/2018  . IR IMAGING GUIDED PORT INSERTION  06/20/2018  . IR REMOVAL TUN ACCESS W/ PORT W/O FL MOD SED  06/20/2018  . IRRIGATION AND DEBRIDEMENT ABSCESS  01/31/2012   Procedure: IRRIGATION AND DEBRIDEMENT ABSCESS;  Surgeon: Shann Medal, MD;  Location: [facility];  Service: General;  Laterality: Right;  right breast and axilla   . NEPHROLITHOTOMY   10/02/2012   Procedure: NEPHROLITHOTOMY PERCUTANEOUS;  Surgeon: Alexis Frock, MD;  Location[facility];  Service: Urology;  Laterality: Right;  First Stage Percutaneous Nephrolithotomy with Surgeon Access, Left Ureteral Stent    . NEPHROLITHOTOMY  10/04/2012   Procedure: NEPHROLITHOTOMY PERCUTANEOUS SECOND LOOK;  Surgeon: Alexis Frock, MD;  [facility];  Service: Urology;  Laterality: Right;     . NEPHROLITHOTOMY  10/08/2012   Procedure: NEPHROLITHOTOMY PERCUTANEOUS;  Surgeon: Alexis Frock, MD;  [facility];  Service: Urology;  Laterality: Right;  THIRD STAGE, nephrostomy tube exchange x 2  . NEPHROLITHOTOMY  10/11/2012   Procedure: NEPHROLITHOTOMY PERCUTANEOUS SECOND LOOK;  Surgeon: Alexis Frock, MD;  [facility];  Service: Urology;  Laterality: Right;  RIGHT 4 STAGE PERCUTANOUS NEPHROLITHOTOMY, right URETEROSCOPY WITH HOLMIUM LASER   . PORTACATH PLACEMENT Left 05/17/2018   Procedure: INSERTION PORT-A-CATH;  Surgeon: Coralie Keens, MD;  Location: [facility];  Service: General;  Laterality: Left;  . RADICAL NECK DISSECTION  04/12/2017   limited/notes 04/12/2017  . RADICAL NECK DISSECTION N/A 04/12/2017   Procedure: RADICAL NECK DISSECTION;  Surgeon: Melida Quitter, MD;  Location: [facility];  Service: ENT;  Laterality: N/A;  limited neck  dissection 2 hours total  . REDUCTION MAMMAPLASTY Bilateral 1998  . [place]  1994  . THYROIDECTOMY  04/12/2017   completion/notes 04/12/2017  . THYROIDECTOMY N/A 04/12/2017   Procedure: THYROIDECTOMY;  Surgeon: Melida Quitter, MD;  Location: [facility];  Service: ENT;  Laterality: N/A;  Completion Thyroidectomy  . TOTAL THYROIDECTOMY  2010  Thyroid nodules removed- pathology at the ENT doctor 2018 were benign -Over active thyroid and thyroidectomy with benign pathology  FAMILY HISTORY Family History  Problem Relation Age of Onset  . Hypertension Mother   . Cancer Mother        laryngeal  . Heart disease Mother        stent  .  Hypertension Father   . Lung cancer Father 106       hx smoking  . Heart disease Other   . Hypertension Other   . Stroke Other        Grandparent  . Kidney disease Other        Grandparent  . Diabetes Other        FH of Diabetes  . Hypertension Sister   . Cancer Maternal Uncle        Lung CA  . Hypertension Brother   . Hypertension Sister   . Breast cancer Maternal Aunt 65  . Breast cancer Paternal Aunt 41  . Prostate cancer Paternal Uncle   . Breast cancer Maternal Aunt        dx 60+  . Breast cancer Paternal Aunt        dx 36's  . Breast cancer Paternal Aunt        dx 82's  . Prostate cancer Paternal Uncle   . Lung cancer Paternal Uncle   . Breast cancer Cousin 16  . Breast cancer Cousin        dx <50  . Breast cancer Cousin        dx <50  . Breast cancer Cousin        dx <50  The patient's mother is alive at age 24. The patient's father died at 80 from lung cancer (heavy smoker). She does not know much about her father. The patient has 1 brother and 2 sisters. The patient's mother had a history of laryngeal cancer. There was a maternal cousin with breast cancer diagnosed at age 74. There were 2 maternal aunts with breast cancer, the youngest being diagnosed in the 79's. There was a maternal great aunt with breast cancer. There were 2 paternal aunts with breast cancer.    GYNECOLOGIC HISTORY:  Patient's last menstrual period was 03/31/2014. Menarche: 52 years old Age at first live birth: 52 years old She is GX P1.  Her LMP was in 2017. She did not use HRT.    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 At home is the patient's daughter, Verdie Drown, age 93, who is a Engineer, technical sales education and working part-time at the [employer]. The patient attends [employer].     ADVANCED DIRECTIVES: Not in place; at the 05/10/2018 visit the patient was given the appropriate documents to complete and notarized at her  discretion   HEALTH MAINTENANCE: Social History   Tobacco Use  . Smoking status: Former Smoker    Packs/day: 0.50    Years: 15.00    Pack years: 7.50    Types: Cigarettes    Start date: 04/12/2017  . Smokeless tobacco: Never Used  Substance Use Topics  . Alcohol  use: Yes    Alcohol/week: 0.0 standard drinks    Comment: social  . Drug use: No     Colonoscopy: no  PAP: [facility]. 2018  Bone density: remote   Allergies  Allergen Reactions  . Tape Rash    Rash   . Genvoya [Elviteg-Cobic-Emtricit-Tenofaf] Hives  . Lisinopril Cough  . Xylocaine [Lidocaine]     Unknown , patient states she has had lidocaine for IV starts and at the dentist with no problems  . Valsartan Cough    Pt states she tolerates medicine now    Current Outpatient Medications  Medication Sig Dispense Refill  . amLODipine (NORVASC) 10 MG tablet Take 1 tablet (10 mg total) by mouth daily. 90 tablet 0  . atorvastatin (LIPITOR) 20 MG tablet Take 1 tablet by mouth every evening for high cholesterol. NEED APPOINTMENT FOR ANY MORE REFILLS 30 tablet 0  . dexamethasone (DECADRON) 4 MG tablet Take 2 tablets (8 mg total) by mouth 2 (two) times daily. Start the day before Taxotere. Take once the day after, then 2 times a day x 2d. 30 tablet 1  . dolutegravir ([name]) 50 MG tablet Take 1 tablet (50 mg total) by mouth daily. 30 tablet 11  . doxycycline (VIBRA-TABS) 100 MG tablet Take 1 tablet (100 mg total) by mouth 2 (two) times daily. 60 tablet 5  . doxycycline (VIBRA-TABS) 100 MG tablet Take 1 tablet (100 mg total) by mouth daily. 90 tablet 2  . emtricitabine-tenofovir AF (DESCOVY) 200-25 MG tablet Take 1 tablet by mouth daily. 30 tablet 11  . glucose blood (ONETOUCH VERIO) test strip Use as instructed to test blood sugar daily 100 each 5  . hydrALAZINE (APRESOLINE) 25 MG tablet TAKE 1 TABLET BY MOUTH THREE TIMES A DAY 270 tablet 1  . irbesartan-hydrochlorothiazide (AVALIDE) 150-12.5 MG tablet TAKE 1  TABLET BY MOUTH ONCE DAILY FOR BLOOD PRESSURE. 90 tablet 1  . levothyroxine (SYNTHROID, LEVOTHROID) 175 MCG tablet Take 1 tablet by mouth every morning on an empty stomach with a full glass of water. No food or other medications for 30 minutes. 30 tablet 0  . lidocaine-prilocaine (EMLA) cream Apply to affected area once 30 g 3  . LORazepam (ATIVAN) 0.5 MG tablet Take 1 tablet (0.5 mg total) by mouth at bedtime as needed (Nausea or vomiting). 20 tablet 0  . metFORMIN (GLUCOPHAGE) 500 MG tablet TAKE 1 TABLET BY MOUTH TWICE A DAY WITH A MEAL (DUE FOR APPT FOR ADDITIONAL REFILLS) 60 tablet 0  . metoprolol succinate (TOPROL-XL) 50 MG 24 hr tablet TAKE 1 TABLET (50 MG TOTAL) BY MOUTH DAILY. TAKE WITH OR IMMEDIATELY FOLLOWING A MEAL. 90 tablet 1  . mometasone (NASONEX) 50 MCG/ACT nasal spray PLACE 2 SPRAYS INTO THE NOSE DAILY. 17 g 3  . Multiple Vitamin (MULTIVITAMIN) tablet Take 1 tablet by mouth daily.    . ONE TOUCH LANCETS MISC Use as instructed to test blood sugar daily 200 each 5  . oxyCODONE (OXY IR/ROXICODONE) 5 MG immediate release tablet Take 1 tablet (5 mg total) by mouth every 6 (six) hours as needed for moderate pain or severe pain. 20 tablet 0  . pantoprazole (PROTONIX) 40 MG tablet Take 40 mg by mouth daily as needed (for indigestion/acid reflux.).     Marland Kitchen PROAIR RESPICLICK 572 (90 Base) MCG/ACT AEPB INHALE 2 PUFFS INTO THE LUNGS EVERY 6 HOURS AS NEEDED FOR WHEEZING OR SHORTNESS OF BREATH. 1 each 2  . prochlorperazine (COMPAZINE) 10 MG tablet Take 1  tablet (10 mg total) by mouth every 6 (six) hours as needed (Nausea or vomiting). 30 tablet 1  . traMADol (ULTRAM) 50 MG tablet Take 1 tablet (50 mg total) by mouth every 6 (six) hours as needed. 30 tablet 0   No current facility-administered medications for this visit.     OBJECTIVE:  Vitals:   07/12/18 0936  BP: (!) 157/98  Pulse: 68  Resp: 18  Temp: 97.9 F (36.6 C)  SpO2: 100%     Body mass index is 38.94 kg/m.   Wt Readings from  Last 3 Encounters:  07/12/18 234 lb (106.1 kg)  07/11/18 235 lb (106.6 kg)  06/21/18 234 lb 6.4 oz (106.3 kg)  ECOG FS:1 - Symptomatic but completely ambulatory GENERAL: Patient is a well appearing female in no acute distress HEENT:  Sclerae anicteric.  Oropharynx clear and moist. No ulcerations or evidence of oropharyngeal candidiasis. Neck is supple.  NODES:  No cervical, supraclavicular, or axillary lymphadenopathy palpated.  BREAST EXAM:  Unable to palpate mass, dressings on patient boils on lower inner breast right axilla clean dry and intact LUNGS:  Clear to auscultation bilaterally.  No wheezes or rhonchi. HEART:  Regular rate and rhythm. No murmur appreciated. ABDOMEN:  Soft, nontender.  Positive, normoactive bowel sounds. No organomegaly palpated. MSK:  No focal spinal tenderness to palpation. Full range of motion bilaterally in the upper extremities. EXTREMITIES:  No peripheral edema.   SKIN:  Clear with no obvious rashes or skin changes. No nail dyscrasia. Dressing over boil in right lower abdominal fold NEURO:  Nonfocal. Well oriented.  Appropriate affect.       LAB RESULTS:  CMP     Component Value Date/Time   NA 142 06/20/2018 1022   K 3.7 06/20/2018 1022   CL 110 06/20/2018 1022   CO2 23 06/20/2018 1022   GLUCOSE 112 (H) 06/20/2018 1022   BUN 18 06/20/2018 1022   CREATININE 1.58 (H) 06/20/2018 1022   CREATININE 1.28 (H) 05/10/2018 1458   CREATININE 1.02 09/23/2015 1047   CALCIUM 9.0 06/20/2018 1022   PROT 7.4 06/06/2018 1505   ALBUMIN 3.3 (L) 06/06/2018 1505   AST 38 06/06/2018 1505   AST 28 05/10/2018 1458   ALT 48 (H) 06/06/2018 1505   ALT 23 05/10/2018 1458   ALKPHOS 145 (H) 06/06/2018 1505   BILITOT 0.4 06/06/2018 1505   BILITOT <0.2 (L) 05/10/2018 1458   GFRNONAA 37 (L) 06/20/2018 1022   GFRNONAA 48 (L) 05/10/2018 1458   GFRAA 43 (L) 06/20/2018 1022   GFRAA 56 (L) 05/10/2018 1458    No results found for: TOTALPROTELP, ALBUMINELP, A1GS, A2GS,  BETS, BETA2SER, GAMS, MSPIKE, SPEI  No results found for: KPAFRELGTCHN, LAMBDASER, KAPLAMBRATIO  Lab Results  Component Value Date   WBC 6.8 07/12/2018   NEUTROABS 3.6 07/12/2018   HGB 9.9 (L) 07/12/2018   HCT 29.5 (L) 07/12/2018   MCV 85.6 07/12/2018   PLT 330 07/12/2018    '@LASTCHEMISTRY'$ @  No results found for: LABCA2  No components found for: [id number]  No results for input(s): INR in the last 168 hours.  No results found for: LABCA2  No results found for: NIO270  No results found for: JJK093  No results found for: GHW299  No results found for: CA2729  No components found for: HGQUANT  No results found for: CEA1 / No results found for: CEA1   No results found for: AFPTUMOR  No results found for: [place]  No results found for: PSA1  Appointment on 07/12/2018  Component Date Value Ref Range Status  . WBC 07/12/2018 6.8  3.9 - 10.3 K/uL Final  . RBC 07/12/2018 3.44* 3.70 - 5.45 MIL/uL Final  . Hemoglobin 07/12/2018 9.9* 11.6 - 15.9 g/dL Final  . HCT 07/12/2018 29.5* 34.8 - 46.6 % Final  . MCV 07/12/2018 85.6  79.5 - 101.0 fL Final  . MCH 07/12/2018 28.8  25.1 - 34.0 pg Final  . MCHC 07/12/2018 33.6  31.5 - 36.0 g/dL Final  . RDW 07/12/2018 20.5* 11.2 - 14.5 % Final  . Platelets 07/12/2018 330  145 - 400 K/uL Final  . Neutrophils Relative % 07/12/2018 53  % Final  . Neutro Abs 07/12/2018 3.6  1.5 - 6.5 K/uL Final  . Lymphocytes Relative 07/12/2018 34  % Final  . Lymphs Abs 07/12/2018 2.3  0.9 - 3.3 K/uL Final  . Monocytes Relative 07/12/2018 11  % Final  . Monocytes Absolute 07/12/2018 0.7  0.1 - 0.9 K/uL Final  . Eosinophils Relative 07/12/2018 1  % Final  . Eosinophils Absolute 07/12/2018 0.1  0.0 - 0.5 K/uL Final  . Basophils Relative 07/12/2018 1  % Final  . Basophils Absolute 07/12/2018 0.0  0.0 - 0.1 K/uL Final   Performed at [facility], [address] [place], [place] [address]    (this displays the last labs from  the last 3 days)  No results found for: TOTALPROTELP, ALBUMINELP, A1GS, A2GS, BETS, BETA2SER, GAMS, MSPIKE, SPEI (this displays SPEP labs)  No results found for: KPAFRELGTCHN, LAMBDASER, KAPLAMBRATIO (kappa/lambda light chains)  No results found for: HGBA, HGBA2QUANT, HGBFQUANT, HGBSQUAN (Hemoglobinopathy evaluation)   No results found for: LDH  Lab Results  Component Value Date   IRON 26 (L) 02/08/2007   TIBC 371 02/08/2007   IRONPCTSAT 7 (L) 02/08/2007   (Iron and TIBC)  No results found for: FERRITIN  Urinalysis    Component Value Date/Time   COLORURINE YELLOW 10/21/2014 2347   APPEARANCEUR CLOUDY (A) 10/21/2014 2347   LABSPEC 1.014 10/21/2014 2347   PHURINE 6.0 10/21/2014 2347   GLUCOSEU NEGATIVE 10/21/2014 2347   GLUCOSEU NEG mg/dL 01/23/2008 1418   HGBUR LARGE (A) 10/21/2014 2347   BILIRUBINUR negative 12/29/2017 [place] 10/21/2014 2347   PROTEINUR 2+ 12/29/2017 1046   PROTEINUR 100 (A) 10/21/2014 2347   UROBILINOGEN 0.2 12/29/2017 1046   UROBILINOGEN 0.2 10/21/2014 2347   NITRITE negative 12/29/2017 1046   NITRITE NEGATIVE 10/21/2014 2347   LEUKOCYTESUR Large (3+) (A) 12/29/2017 1046     STUDIES:   ELIGIBLE FOR AVAILABLE RESEARCH PROTOCOL: UPBEAT  ASSESSMENT: 52 y.o. [place], [place] woman status post right breast upper inner quadrant biopsy 04/26/2018 for a clinical T2 N0, stage Ib invasive ductal carcinoma, grade 2, with an MIB-1 of 70%.  (1) genetics testing 07/03/2018  (2) neoadjuvant chemotherapy will consist of carboplatin, docetaxel, trastuzumab and Pertuzumab given every 21 days x 6, starting 05/31/2018  (a) Pertuzumab discontinued starting with cycle 2 because of diarrhea problems  (3) trastuzumab to continue to total 6 months  (a) echo on 05/16/2018 shows an EF of 60-65%.  (4) definitive surgery to follow chemotherapy  (5) adjuvant radiation to follow surgery  (6) antiestrogens to start at the completion of local  treatment  (7) hidradenitis/boils: on doxycycline daily  PLAN: Dawnell is doing well today.  Her CBC is stable.  She will proceed with her third cycle of neoadjuvant Docetaxel, Carboplatin and Trastuzumab (so long as CMET is within parameters).  Her tumor is shrinking, and I have a hard time palpating where it previously was.  I reviewed the issue of the numbness in the heels with Dr. Lindi Adie.  Since it is atypical presentation and resolved, patient will proceed with Docetaxel at her standard dose.  She will continue Doxycycline for her hydradenitis/boils.  Should they start to worsen, we may need to consider changing, or sending her to ID for evaluation.    Desare has not yet heard about scheduling an appointment with a new surgeon.  I will have sent a message to Bary Castilla to go ahead and get this done.    Deyja will return in 1 week for labs and follow up.  She knows to call for any questions or concerns between now and her next appointment.  A total of (30) minutes of face-to-face time was spent with this patient with greater than 50% of that time in counseling and care-coordination.   Wilber Bihari, NP 07/12/18   Addendum: patient potassium 3.  Patient asked again about diarrhea.  She admits that she has had some.  Recommended she take imodium as we have discussed previously.  Sent in potassium supplements.  If continues will need stool studies, ? Flagyl.

## 2018-07-12 NOTE — Progress Notes (Signed)
Per Wilber Bihari NP okay to treat with K of 3.0

## 2018-07-12 NOTE — Patient Instructions (Signed)
[facility] Discharge Instructions for Patients Receiving Chemotherapy  Today you received the following chemotherapy agents Herceptin, Taxotere, and Carboplatin  To help prevent nausea and vomiting after your treatment, we encourage you to take your nausea medication as directed If you develop nausea and vomiting that is not controlled by your nausea medication, call the clinic.   BELOW ARE SYMPTOMS THAT SHOULD BE REPORTED IMMEDIATELY:  *FEVER GREATER THAN 100.5 F  *CHILLS WITH OR WITHOUT FEVER  NAUSEA AND VOMITING THAT IS NOT CONTROLLED WITH YOUR NAUSEA MEDICATION  *UNUSUAL SHORTNESS OF BREATH  *UNUSUAL BRUISING OR BLEEDING  TENDERNESS IN MOUTH AND THROAT WITH OR WITHOUT PRESENCE OF ULCERS  *URINARY PROBLEMS  *BOWEL PROBLEMS  UNUSUAL RASH Items with * indicate a potential emergency and should be followed up as soon as possible.  Feel free to call the clinic should you have any questions or concerns. The clinic phone number is [phone].  Please show the [place] at check-in to the Emergency Department and triage nurse.

## 2018-07-13 ENCOUNTER — Telehealth: Payer: Self-pay | Admitting: *Deleted

## 2018-07-13 ENCOUNTER — Encounter: Payer: Self-pay | Admitting: Adult Health

## 2018-07-13 LAB — HIV-1 RNA QUANT-NO REFLEX-BLD
HIV 1 RNA Quant: <20 copies/mL — AB
HIV-1 RNA Quant, Log: <1.3 Log copies/mL — AB

## 2018-07-13 NOTE — Telephone Encounter (Signed)
Received notification that pt wishes to see a new surgeon.  Called and received appt for pt to see Dr. Brantley Stage on 07/17/18 at 3:15pm. Confirmed appt with pt. Denies further needs at this time.

## 2018-07-14 ENCOUNTER — Inpatient Hospital Stay: Payer: Managed Care, Other (non HMO)

## 2018-07-14 VITALS — BP 164/92 | HR 74 | Temp 98.0°F | Resp 18

## 2018-07-14 DIAGNOSIS — Z170 Estrogen receptor positive status [ER+]: Principal | ICD-10-CM

## 2018-07-14 DIAGNOSIS — C50311 Malignant neoplasm of lower-inner quadrant of right female breast: Secondary | ICD-10-CM

## 2018-07-14 DIAGNOSIS — Z5111 Encounter for antineoplastic chemotherapy: Secondary | ICD-10-CM | POA: Diagnosis not present

## 2018-07-14 MED ORDER — PEGFILGRASTIM-CBQV 6 MG/0.6ML ~~LOC~~ SOSY
6.0000 mg | PREFILLED_SYRINGE | Freq: Once | SUBCUTANEOUS | Status: AC
  Administered 2018-07-14: 6 mg via SUBCUTANEOUS

## 2018-07-14 MED ORDER — PEGFILGRASTIM-CBQV 6 MG/0.6ML ~~LOC~~ SOSY
PREFILLED_SYRINGE | SUBCUTANEOUS | Status: AC
  Filled 2018-07-14: qty 0.6

## 2018-07-16 ENCOUNTER — Encounter: Payer: Managed Care, Other (non HMO) | Admitting: Genetics

## 2018-07-18 ENCOUNTER — Other Ambulatory Visit: Payer: Self-pay

## 2018-07-18 ENCOUNTER — Ambulatory Visit: Payer: Self-pay | Admitting: Adult Health

## 2018-07-24 ENCOUNTER — Telehealth: Payer: Self-pay | Admitting: Genetics

## 2018-07-24 ENCOUNTER — Encounter: Payer: Self-pay | Admitting: Plastic Surgery

## 2018-07-24 NOTE — Telephone Encounter (Signed)
  Revealed negative genetic testing. While reassuring, we discussed that we do not know why she has breast cancer or why there is cancer in the family. It could be due to a different gene that we are not testing, or maybe our current technology may not be able to pick something up.  It will be important for her to keep in contact with genetics to learn if additional testing may be needed in the future.  We recommended her relatives on both sides of the family also have genetic testing.  We recommend her daughter start breast screening by 49 ( and tell her doctors about the family history) as they may screen even earlier based on personal and family risk factors.

## 2018-07-25 ENCOUNTER — Other Ambulatory Visit: Payer: Self-pay | Admitting: Primary Care

## 2018-07-25 ENCOUNTER — Encounter: Payer: Self-pay | Admitting: Genetics

## 2018-07-25 ENCOUNTER — Ambulatory Visit: Payer: Self-pay | Admitting: Genetics

## 2018-07-25 DIAGNOSIS — Z803 Family history of malignant neoplasm of breast: Secondary | ICD-10-CM

## 2018-07-25 DIAGNOSIS — Z801 Family history of malignant neoplasm of trachea, bronchus and lung: Secondary | ICD-10-CM

## 2018-07-25 DIAGNOSIS — C50311 Malignant neoplasm of lower-inner quadrant of right female breast: Secondary | ICD-10-CM

## 2018-07-25 DIAGNOSIS — Z8042 Family history of malignant neoplasm of prostate: Secondary | ICD-10-CM

## 2018-07-25 DIAGNOSIS — Z170 Estrogen receptor positive status [ER+]: Principal | ICD-10-CM

## 2018-07-25 DIAGNOSIS — C73 Malignant neoplasm of thyroid gland: Secondary | ICD-10-CM

## 2018-07-25 DIAGNOSIS — C50211 Malignant neoplasm of upper-inner quadrant of right female breast: Secondary | ICD-10-CM

## 2018-07-25 DIAGNOSIS — C50911 Malignant neoplasm of unspecified site of right female breast: Secondary | ICD-10-CM

## 2018-07-25 DIAGNOSIS — Z1379 Encounter for other screening for genetic and chromosomal anomalies: Secondary | ICD-10-CM

## 2018-07-25 HISTORY — DX: Encounter for other screening for genetic and chromosomal anomalies: Z13.79

## 2018-07-25 NOTE — Progress Notes (Signed)
HPI:  [name] was previously seen in the [facility] on 07/03/2018 due to a personal and family history of cancer and concerns regarding a hereditary predisposition to cancer. Please refer to our prior cancer genetics clinic note for more information regarding [name]'s medical, social and family histories, and our assessment and recommendations, at the time. [name] recent genetic test results were disclosed to her, as well as recommendations warranted by these results. These results and recommendations are discussed in more detail below.  CANCER HISTORY:    Malignant neoplasm of lower-inner quadrant of right breast of female, estrogen receptor positive ([place])   05/10/2018 Initial Diagnosis    Malignant neoplasm of lower-inner quadrant of right breast of female, estrogen receptor positive ([place])    05/30/2018 -  Chemotherapy    The patient had palonosetron (ALOXI) injection 0.25 mg, 0.25 mg, Intravenous,  Once, 3 of 6 cycles Administration: 0.25 mg (05/31/2018), 0.25 mg (06/21/2018), 0.25 mg (07/12/2018) pegfilgrastim-cbqv (UDENYCA) injection 6 mg, 6 mg, Subcutaneous, Once, 3 of 6 cycles Administration: 6 mg (06/02/2018), 6 mg (06/23/2018), 6 mg (07/14/2018) trastuzumab (HERCEPTIN) 900 mg in sodium chloride 0.9 % 250 mL chemo infusion, 903 mg, Intravenous,  Once, 3 of 6 cycles Administration: 672 mg (06/21/2018), 900 mg (06/01/2018), 672 mg (07/12/2018) CARBOplatin (PARAPLATIN) 580 mg in sodium chloride 0.9 % 250 mL chemo infusion, 580 mg (98.8 % of original dose 588 mg), Intravenous,  Once, 3 of 6 cycles Dose modification:   (original dose 588 mg, Cycle 1) Administration: 580 mg (05/31/2018), 500 mg (06/21/2018), 530 mg (07/12/2018) DOCEtaxel (TAXOTERE) 170 mg in sodium chloride 0.9 % 250 mL chemo infusion, 75 mg/m2 = 170 mg, Intravenous,  Once, 3 of 6 cycles Administration: 170 mg (05/31/2018), 170 mg (06/21/2018), 170 mg (07/12/2018) pertuzumab (PERJETA) 840 mg in sodium chloride 0.9 %  250 mL chemo infusion, 840 mg, Intravenous, Once, 1 of 1 cycle Administration: 840 mg (06/01/2018) fosaprepitant (EMEND) 150 mg, dexamethasone (DECADRON) 12 mg in sodium chloride 0.9 % 145 mL IVPB, , Intravenous,  Once, 3 of 6 cycles Administration:  (05/31/2018),  (06/21/2018),  (07/12/2018)  for chemotherapy treatment.       FAMILY HISTORY:  We obtained a detailed, 4-generation family history.  Significant diagnoses are listed below: Family History  Problem Relation Age of Onset  . Hypertension Mother   . Cancer Mother        laryngeal  . Heart disease Mother        stent  . Hypertension Father   . Lung cancer Father 37       hx smoking  . Heart disease Other   . Hypertension Other   . Stroke Other        Grandparent  . Kidney disease Other        Grandparent  . Diabetes Other        FH of Diabetes  . Hypertension Sister   . Cancer Maternal Uncle        Lung CA  . Hypertension Brother   . Hypertension Sister   . Breast cancer Maternal Aunt 65  . Breast cancer Paternal Aunt 2  . Prostate cancer Paternal Uncle   . Breast cancer Maternal Aunt        dx 60+  . Breast cancer Paternal Aunt        dx 18's  . Breast cancer Paternal Aunt        dx 53's  . Prostate cancer Paternal Uncle   .  Lung cancer Paternal Uncle   . Breast cancer Cousin 36  . Breast cancer Cousin        dx <50  . Breast cancer Cousin        dx <50  . Breast cancer Cousin        dx <50    [name] has a 41 year-old daughter with no history of cancer.  [name] has 2 maternal half sisters (32 and 39) and a maternal half brother who is 30 with no history of cancer.   [name] father: died at 36 due to lung cancer.  He had a hx of smoking.  Paternal aunts/Uncles: 3 paternal aunts, 3 paternal uncles: -1 paternal uncle had lung cancer -2 paternal uncles had prostate cancer -3 paternal aunts had breast cancer dx in their 69's Paternal cousins: 3 paternal cousins with breast cancer dx under 56 (they  are not siblings of each other) Paternal grandfather: unk Paternal grandmother:unk  [name]'s mother: 55, hx of laryngeal cancer Maternal Aunts/Uncles: 2 maternal aunts with breast caner dx 60's+, 1 maternal uncle with lung cancer, 9 other maternal aunts/uncles with no history of cancer.  Maternal cousins: 1 maternal cousin is currently being treated for breast cancer- she is 71.  Maternal grandfather: no history of cancer Maternal grandmother:no history of cancer  [name] is unaware of previous family history of genetic testing for hereditary cancer risks. Patient's maternal ancestors are of African American descent, and paternal ancestors are of African American descent. There is no reported Ashkenazi Jewish ancestry. There is no known consanguinity.  GENETIC TEST RESULTS: Genetic testing performed through [facility] reported out on 07/24/2018 showed no pathogenic mutations. Genes Analyzed (43 total): APC*, ATM*, AXIN2, BARD1, BMPR1A, BRCA1*, BRCA2*, BRIP1*, CDH1*, CDK4, CDKN2A, CHEK2*, DICER1,GALNT12, HOXB13, MEN1, MLH1*, MRE11A, MSH2*, MSH3, MSH6*, MUTYH*, NBN, NF1*, NTHL1, PALB2*, PMS2*, POLD1, POLE,PTEN*, RAD50, RAD51C*, RAD51D*, RET, SDHB, SDHD, SMAD4, SMARCA4, STK11 and TP53* (sequencing and deletion/duplication); EGFR (sequencing only); EPCAM and GREM1 (deletion/duplication only). DNA and RNA analyses performed for * genes.  The test report will be scanned into EPIC and will be located under the Molecular Pathology section of the Results Review tab. A portion of the result report is included below for reference.     We discussed with [name] that because current genetic testing is not perfect, it is possible there may be a gene mutation in one of these genes that current testing cannot detect, but that chance is small.  We also discussed, that there could be another gene that has not yet been discovered, or that we have not yet tested, that is responsible for the  cancer diagnoses in the family. It is also possible there is a hereditary cause for the cancer in the family that [name] did not inherit and therefore was not identified in her testing.  Therefore, it is important to remain in touch with cancer genetics in the future so that we can continue to offer [name] the most up to date genetic testing.   ADDITIONAL GENETIC TESTING: We discussed with [name] that her genetic testing was fairly extensive.  If there are are genes identified to increase cancer risk that can be analyzed in the future, we would be happy to discuss and coordinate this testing at that time.    CANCER SCREENING RECOMMENDATIONS: This negative result means that we were unable to identify a hereditary cause for her personal and family history of cancer at this time.  This result does not necessarily rule out a hereditary cause for her  cancer.  It is still possible that there could be genetic mutations that are undetectable by current technology, or genetic mutations in genes that have not been tested or identified to increase cancer risk.  Therefore, it is recommended she continue to follow the cancer management and screening guidelines provided by her oncology and primary healthcare provider. An individual's cancer risk is not determined by genetic test results alone.  Overall cancer risk assessment includes additional factors such as personal medical history, family history, etc.  These should be used to make a personalized plan for cancer prevention and surveillance.    RECOMMENDATIONS FOR FAMILY MEMBERS:  Relatives in this family might be at some increased risk of developing cancer, over the general population risk, simply due to the family history of cancer.  We recommended women in this family have a yearly mammogram beginning at age 3, or 31 years younger than the earliest onset of cancer, an annual clinical breast exam, and perform monthly breast self-exams. Women in this  family should also have a gynecological exam as recommended by their primary provider. All family members should have a colonoscopy by age 27 (or as directed by their doctors).  All family members should inform their physicians about the family history of cancer so their doctors can make the most appropriate screening recommendations for them.  Her daughter should start annual mammograms by age 41 (or earlier as determined by her doctors based on personal/family risk factors).   It is also possible there is a hereditary cause for the cancer in [name]'s family that she did not inherit and therefore was not identified in her.   Therefore, we recommended all of her reltaives (maternal and paternal) also have genetic counseling and testing. [name] will let us know if we can be of any assistance in coordinating genetic counseling and/or testing for these family members.   FOLLOW-UP: Lastly, we discussed with Ms. Gaza that cancer genetics is a rapidly advancing field and it is possible that new genetic tests will be appropriate for her and/or her family members in the future. We encouraged her to remain in contact with cancer genetics on an annual basis so we can update her personal and family histories and let her know of advances in cancer genetics that may benefit this family.   Our contact number was provided. Ms. Cedeno questions were answered to her satisfaction, and she knows she is welcome to call us at anytime with additional questions or concerns.   Ferol Luz, MS, [facility] Certified Genetic Counselor [name]

## 2018-08-02 ENCOUNTER — Encounter: Payer: Self-pay | Admitting: Adult Health

## 2018-08-02 ENCOUNTER — Inpatient Hospital Stay: Payer: Managed Care, Other (non HMO)

## 2018-08-02 ENCOUNTER — Inpatient Hospital Stay (HOSPITAL_BASED_OUTPATIENT_CLINIC_OR_DEPARTMENT_OTHER): Payer: Managed Care, Other (non HMO) | Admitting: Adult Health

## 2018-08-02 ENCOUNTER — Other Ambulatory Visit: Payer: Self-pay

## 2018-08-02 ENCOUNTER — Telehealth: Payer: Self-pay | Admitting: Adult Health

## 2018-08-02 ENCOUNTER — Inpatient Hospital Stay: Payer: Managed Care, Other (non HMO) | Attending: Oncology

## 2018-08-02 ENCOUNTER — Encounter: Payer: Self-pay | Admitting: *Deleted

## 2018-08-02 VITALS — BP 159/94 | HR 76 | Temp 98.1°F | Resp 18 | Ht 65.0 in

## 2018-08-02 DIAGNOSIS — L0292 Furuncle, unspecified: Secondary | ICD-10-CM | POA: Diagnosis not present

## 2018-08-02 DIAGNOSIS — I10 Essential (primary) hypertension: Secondary | ICD-10-CM

## 2018-08-02 DIAGNOSIS — C50211 Malignant neoplasm of upper-inner quadrant of right female breast: Secondary | ICD-10-CM

## 2018-08-02 DIAGNOSIS — Z803 Family history of malignant neoplasm of breast: Secondary | ICD-10-CM | POA: Diagnosis not present

## 2018-08-02 DIAGNOSIS — R197 Diarrhea, unspecified: Secondary | ICD-10-CM | POA: Diagnosis not present

## 2018-08-02 DIAGNOSIS — C50311 Malignant neoplasm of lower-inner quadrant of right female breast: Secondary | ICD-10-CM

## 2018-08-02 DIAGNOSIS — Z5111 Encounter for antineoplastic chemotherapy: Secondary | ICD-10-CM | POA: Insufficient documentation

## 2018-08-02 DIAGNOSIS — Z87891 Personal history of nicotine dependence: Secondary | ICD-10-CM

## 2018-08-02 DIAGNOSIS — E876 Hypokalemia: Secondary | ICD-10-CM

## 2018-08-02 DIAGNOSIS — G629 Polyneuropathy, unspecified: Secondary | ICD-10-CM | POA: Insufficient documentation

## 2018-08-02 DIAGNOSIS — Z5189 Encounter for other specified aftercare: Secondary | ICD-10-CM | POA: Diagnosis not present

## 2018-08-02 DIAGNOSIS — Z170 Estrogen receptor positive status [ER+]: Secondary | ICD-10-CM | POA: Diagnosis not present

## 2018-08-02 DIAGNOSIS — Z801 Family history of malignant neoplasm of trachea, bronchus and lung: Secondary | ICD-10-CM

## 2018-08-02 DIAGNOSIS — L732 Hidradenitis suppurativa: Secondary | ICD-10-CM | POA: Insufficient documentation

## 2018-08-02 DIAGNOSIS — Z95828 Presence of other vascular implants and grafts: Secondary | ICD-10-CM

## 2018-08-02 DIAGNOSIS — Z5112 Encounter for antineoplastic immunotherapy: Secondary | ICD-10-CM | POA: Diagnosis present

## 2018-08-02 DIAGNOSIS — Z808 Family history of malignant neoplasm of other organs or systems: Secondary | ICD-10-CM

## 2018-08-02 DIAGNOSIS — C73 Malignant neoplasm of thyroid gland: Secondary | ICD-10-CM

## 2018-08-02 LAB — CBC WITH DIFFERENTIAL/PLATELET
Abs Immature Granulocytes: 0.03 10*3/uL (ref 0.00–0.07)
BASOS ABS: 0 10*3/uL (ref 0.0–0.1)
BASOS PCT: 1 %
EOS ABS: 0.1 10*3/uL (ref 0.0–0.5)
EOS PCT: 1 %
HCT: 29.6 % — ABNORMAL LOW (ref 36.0–46.0)
Hemoglobin: 9.6 g/dL — ABNORMAL LOW (ref 12.0–15.0)
IMMATURE GRANULOCYTES: 1 %
Lymphocytes Relative: 43 %
Lymphs Abs: 2.5 10*3/uL (ref 0.7–4.0)
MCH: 28.7 pg (ref 26.0–34.0)
MCHC: 32.4 g/dL (ref 30.0–36.0)
MCV: 88.4 fL (ref 80.0–100.0)
Monocytes Absolute: 0.6 10*3/uL (ref 0.1–1.0)
Monocytes Relative: 10 %
NEUTROS PCT: 44 %
NRBC: 0 % (ref 0.0–0.2)
Neutro Abs: 2.6 10*3/uL (ref 1.7–7.7)
PLATELETS: 247 10*3/uL (ref 150–400)
RBC: 3.35 MIL/uL — ABNORMAL LOW (ref 3.87–5.11)
RDW: 20.7 % — AB (ref 11.5–15.5)
WBC: 5.8 10*3/uL (ref 4.0–10.5)

## 2018-08-02 LAB — COMPREHENSIVE METABOLIC PANEL
ALBUMIN: 3.2 g/dL — AB (ref 3.5–5.0)
ALT: 14 U/L (ref 0–44)
ANION GAP: 9 (ref 5–15)
AST: 17 U/L (ref 15–41)
Alkaline Phosphatase: 120 U/L (ref 38–126)
BILIRUBIN TOTAL: 0.3 mg/dL (ref 0.3–1.2)
BUN: 17 mg/dL (ref 6–20)
CHLORIDE: 105 mmol/L (ref 98–111)
CO2: 29 mmol/L (ref 22–32)
Calcium: 9.4 mg/dL (ref 8.9–10.3)
Creatinine, Ser: 1.32 mg/dL — ABNORMAL HIGH (ref 0.44–1.00)
GFR calc Af Amer: 53 mL/min — ABNORMAL LOW (ref >60)
GFR, EST NON AFRICAN AMERICAN: 46 mL/min — AB (ref >60)
GLUCOSE: 103 mg/dL — AB (ref 70–99)
POTASSIUM: 3.2 mmol/L — AB (ref 3.5–5.1)
Sodium: 143 mmol/L (ref 135–145)
TOTAL PROTEIN: 8 g/dL (ref 6.5–8.1)

## 2018-08-02 MED ORDER — ACETAMINOPHEN 325 MG PO TABS
650.0000 mg | ORAL_TABLET | Freq: Once | ORAL | Status: AC
  Administered 2018-08-02: 650 mg via ORAL

## 2018-08-02 MED ORDER — TRASTUZUMAB CHEMO 150 MG IV SOLR
6.0000 mg/kg | Freq: Once | INTRAVENOUS | Status: AC
  Administered 2018-08-02: 672 mg via INTRAVENOUS
  Filled 2018-08-02: qty 32

## 2018-08-02 MED ORDER — ACETAMINOPHEN 325 MG PO TABS
ORAL_TABLET | ORAL | Status: AC
  Filled 2018-08-02: qty 2

## 2018-08-02 MED ORDER — DIPHENHYDRAMINE HCL 25 MG PO CAPS
ORAL_CAPSULE | ORAL | Status: AC
  Filled 2018-08-02: qty 2

## 2018-08-02 MED ORDER — PALONOSETRON HCL INJECTION 0.25 MG/5ML
INTRAVENOUS | Status: AC
  Filled 2018-08-02: qty 5

## 2018-08-02 MED ORDER — SODIUM CHLORIDE 0.9 % IV SOLN
Freq: Once | INTRAVENOUS | Status: AC
  Administered 2018-08-02: 12:00:00 via INTRAVENOUS
  Filled 2018-08-02: qty 5

## 2018-08-02 MED ORDER — SODIUM CHLORIDE 0.9 % IV SOLN
Freq: Once | INTRAVENOUS | Status: AC
  Administered 2018-08-02: 11:00:00 via INTRAVENOUS
  Filled 2018-08-02: qty 250

## 2018-08-02 MED ORDER — DIPHENHYDRAMINE HCL 25 MG PO CAPS
50.0000 mg | ORAL_CAPSULE | Freq: Once | ORAL | Status: AC
  Administered 2018-08-02: 50 mg via ORAL

## 2018-08-02 MED ORDER — HEPARIN SOD (PORK) LOCK FLUSH 100 UNIT/ML IV SOLN
500.0000 [IU] | Freq: Once | INTRAVENOUS | Status: AC | PRN
  Administered 2018-08-02: 500 [IU]
  Filled 2018-08-02: qty 5

## 2018-08-02 MED ORDER — SODIUM CHLORIDE 0.9 % IV SOLN
75.0000 mg/m2 | Freq: Once | INTRAVENOUS | Status: AC
  Administered 2018-08-02: 170 mg via INTRAVENOUS
  Filled 2018-08-02: qty 17

## 2018-08-02 MED ORDER — SODIUM CHLORIDE 0.9% FLUSH
10.0000 mL | INTRAVENOUS | Status: DC | PRN
  Administered 2018-08-02: 10 mL
  Filled 2018-08-02: qty 10

## 2018-08-02 MED ORDER — SODIUM CHLORIDE 0.9 % IV SOLN
574.0000 mg | Freq: Once | INTRAVENOUS | Status: AC
  Administered 2018-08-02: 570 mg via INTRAVENOUS
  Filled 2018-08-02: qty 57

## 2018-08-02 MED ORDER — PALONOSETRON HCL INJECTION 0.25 MG/5ML
0.2500 mg | Freq: Once | INTRAVENOUS | Status: AC
  Administered 2018-08-02: 0.25 mg via INTRAVENOUS

## 2018-08-02 NOTE — Progress Notes (Signed)
[place]  [phone] [phone]     ID: [name] DOB: 15-Dec-1965  MR#: [id number]  PJA#:[id number]  Patient Care Team: [name], NP as PCP - General (Internal Medicine) Michel Bickers, MD as PCP - Infectious Diseases (Infectious Diseases) Magrinat, Virgie Dad, MD as Consulting Physician (Oncology) Kyung Rudd, MD as Consulting Physician (Radiation Oncology) Coralie Keens, MD as Consulting Physician (General Surgery) OTHER MD:   CHIEF COMPLAINT: Triple positive breast cancer  CURRENT TREATMENT: Neoadjuvant chemotherapy  INTERVAL HISTORY: [name] returns today for follow up and treatment of her triple positive breast cancer, accompanied by her daughter. She receives docetaxel, carboplatin, trastuzumab, and pertuzumab every 21 days, with today being day 1 cycle 4. She also receives [place] on day 3 of every cycle. Pertuzumab held after cycle 1 due to diarrhea.    REVIEW OF SYSTEMS: [name] denies numbness and tingling in the fingertips or toes.  She denies diarrhea.  She says it resolved this week.  She continues to have hydradenitis and is taking doxycycline for this.  [name] is having difficulty with meeting her job requirements. She is taking FMLA and is off on Thursday and Friday.  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  She would like some assistance in writing a letter to her work about her side effects.  She is requesting a vague letter initially to her HR, and will let us know if they need any additional details.  She does not have a job description or job duties list that she can give me today.    [name] is otherwise doing moderately well.  A detailed ROS was otherwise non contributory.    HISTORY OF CURRENT ILLNESS: From the original intake note:  [name] (pronounced "Huff")  palpated a mass in the right breast for about 2 weeks before bringing it to medical attention. She underwent bilateral diagnostic mammography with tomography and right breast ultrasonography at [facility] on 04/20/2018 showing: breast density category B. There was a highly suspicious mass located at 3 o'clock in the upper inner quadrant measuring 2.9 x 2.5 x 1.6 cm and 4 cm from the nipple. Ultrasound of the right axilla showed 5 lymph nodes with abnormal cortical thickening. The left axilla showed 3 lymph nodes with abnormal cortical thickening.  Accordingly on 04/26/2018 she proceeded to biopsy of the right breast area in question. The pathology from this procedure showed ([id number]): Invasive ductal carcinoma, grade II with calcifications. One right axillary lymph node and one left axillary lymph node were negative for carcinoma. Prognostic indicators significant for: estrogen receptor, 100% positive and progesterone receptor, 80% positive, both with strong staining intensity. Proliferation marker Ki67 at 70%. HER2 amplified with ratios HER2/CEP17 signals 4.52 and average HER2 copies per cell 10.85  The patient's subsequent history is as detailed below.    PAST MEDICAL HISTORY: Past Medical History:  Diagnosis Date  . Anemia    Normocytic  . Anxiety   . Asthma   . Blood dyscrasia   . Bronchitis 2005  . 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 Diabetes mellitus without complication ([place])   . Family history of breast cancer   . Family history of lung cancer   . Family history of prostate cancer   . Gastroenteritis 07/10/07  . GERD 07/24/2006  . GRAVE'S DISEASE 01/01/2008  . History of hidradenitis suppurativa   .  History of kidney stones   . History of thrush   . HIV DISEASE 07/24/2006   dx March 05  . HYPERTENSION 07/24/2006  . Hyperthyroidism 08/2006   Grave's Disease -diffuse radiotracer uptake 08/25/06 Thyroid scan-Cold nodule to R lower lobe of thyrorid  .  Menometrorrhagia    hx of  . Nephrolithiasis   . 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 Pneumonia 2005  . 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 Sarcoidosis 02/08/2007   dx as a teenager in [place] from abnl CXR. Completed 2 yrs Prednisone after lung bx confirmation. No symptoms since then.  . Thyroid cancer ([place])   . THYROID NODULE, RIGHT 02/08/2007  -2018 thyroid nodules were precancerous but pathology was negative for thyroid cancer.   PAST SURGICAL HISTORY: Past Surgical History:  Procedure Laterality Date  . BREAST SURGERY  1997   Breast Reduction   . CYSTOSCOPY W/ URETERAL STENT REMOVAL  11/09/2012   Procedure: CYSTOSCOPY WITH STENT REMOVAL;  Surgeon: Alexis Frock, MD;  Location: [facility];  Service: Urology;  Laterality: Right;  . CYSTOSCOPY WITH RETROGRADE PYELOGRAM, URETEROSCOPY AND STENT PLACEMENT  11/09/2012   Procedure: CYSTOSCOPY WITH RETROGRADE PYELOGRAM, URETEROSCOPY AND STENT PLACEMENT;  Surgeon: Alexis Frock, MD;  Location: [facility];  Service: Urology;  Laterality: Left;  LEFT URETEROSCOPY, STONE MANIPULATION, left STENT exchange   . CYSTOSCOPY WITH STENT PLACEMENT  10/02/2012   Procedure: CYSTOSCOPY WITH STENT PLACEMENT;  Surgeon: Alexis Frock, MD;  Location[facility];  Service: Urology;  Laterality: Left;  . DILATION AND CURETTAGE OF UTERUS  Feb 2004   s/p for 1st trimester nonviable pregnancy  . EYE SURGERY     sty under eyelid  . INCISE AND DRAIN ABCESS  Nov 03   s/p I &D for righ inframmary fold hidradenitis  . INCISION AND DRAINAGE PERITONSILLAR ABSCESS  Mar 03  . IR CV LINE INJECTION  06/07/2018  . IR IMAGING GUIDED PORT INSERTION  06/20/2018  . IR REMOVAL TUN ACCESS W/ PORT W/O FL MOD SED  06/20/2018  . IRRIGATION AND DEBRIDEMENT ABSCESS  01/31/2012   Procedure: IRRIGATION AND DEBRIDEMENT ABSCESS;  Surgeon: Shann Medal, MD;  Location: [facility];  Service: General;  Laterality: Right;  right breast and  axilla   . NEPHROLITHOTOMY  10/02/2012   Procedure: NEPHROLITHOTOMY PERCUTANEOUS;  Surgeon: Alexis Frock, MD;  Location[facility];  Service: Urology;  Laterality: Right;  First Stage Percutaneous Nephrolithotomy with Surgeon Access, Left Ureteral Stent    . NEPHROLITHOTOMY  10/04/2012   Procedure: NEPHROLITHOTOMY PERCUTANEOUS SECOND LOOK;  Surgeon: Alexis Frock, MD;  [facility];  Service: Urology;  Laterality: Right;     . NEPHROLITHOTOMY  10/08/2012   Procedure: NEPHROLITHOTOMY PERCUTANEOUS;  Surgeon: Alexis Frock, MD;  [facility];  Service: Urology;  Laterality: Right;  THIRD STAGE, nephrostomy tube exchange x 2  . NEPHROLITHOTOMY  10/11/2012   Procedure: NEPHROLITHOTOMY PERCUTANEOUS SECOND LOOK;  Surgeon: Alexis Frock, MD;  [facility];  Service: Urology;  Laterality: Right;  RIGHT 4 STAGE PERCUTANOUS NEPHROLITHOTOMY, right URETEROSCOPY WITH HOLMIUM LASER   . PORTACATH PLACEMENT Left 05/17/2018   Procedure: INSERTION PORT-A-CATH;  Surgeon: Coralie Keens, MD;  Location: [facility];  Service: General;  Laterality: Left;  . RADICAL NECK DISSECTION  04/12/2017   limited/notes 04/12/2017  . RADICAL NECK DISSECTION N/A 04/12/2017   Procedure: RADICAL NECK DISSECTION;  Surgeon: Melida Quitter, MD;  Location: [facility];  Service: ENT;  Laterality: N/A;  limited neck dissection 2 hours total  . REDUCTION MAMMAPLASTY Bilateral 1998  . [place]  1994  . THYROIDECTOMY  04/12/2017   completion/notes 04/12/2017  . THYROIDECTOMY N/A 04/12/2017   Procedure: THYROIDECTOMY;  Surgeon: Melida Quitter, MD;  Location: [facility];  Service: ENT;  Laterality: N/A;  Completion Thyroidectomy  . TOTAL THYROIDECTOMY  2010  Thyroid nodules removed- pathology at the ENT doctor 2018 were benign -Over active thyroid and thyroidectomy with benign pathology  FAMILY HISTORY Family History  Problem Relation Age of Onset  . Hypertension Mother   . Cancer Mother        laryngeal  . Heart disease  Mother        stent  . Hypertension Father   . Lung cancer Father 60       hx smoking  . Heart disease Other   . Hypertension Other   . Stroke Other        Grandparent  . Kidney disease Other        Grandparent  . Diabetes Other        FH of Diabetes  . Hypertension Sister   . Cancer Maternal Uncle        Lung CA  . Hypertension Brother   . Hypertension Sister   . Breast cancer Maternal Aunt 65  . Breast cancer Paternal Aunt 23  . Prostate cancer Paternal Uncle   . Breast cancer Maternal Aunt        dx 60+  . Breast cancer Paternal Aunt        dx 93's  . Breast cancer Paternal Aunt        dx 109's  . Prostate cancer Paternal Uncle   . Lung cancer Paternal Uncle   . Breast cancer Cousin 35  . Breast cancer Cousin        dx <50  . Breast cancer Cousin        dx <50  . Breast cancer Cousin        dx <50  The patient's mother is alive at age 72. The patient's father died at 65 from lung cancer (heavy smoker). She does not know much about her father. The patient has 1 brother and 2 sisters. The patient's mother had a history of laryngeal cancer. There was a maternal cousin with breast cancer diagnosed at age 98. There were 2 maternal aunts with breast cancer, the youngest being diagnosed in the 71's. There was a maternal great aunt with breast cancer. There were 2 paternal aunts with breast cancer.    GYNECOLOGIC HISTORY:  Patient's last menstrual period was 03/31/2014. Menarche: 52 years old Age at first live birth: 52 years old She is GX P1.  Her LMP was in 2017. She did not use HRT.    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 At home is the patient's daughter, [name], age 27, who is a Engineer, technical sales education and working part-time at the [employer]. The patient attends [employer].     ADVANCED DIRECTIVES: Not in place; at the 05/10/2018 visit the patient was given the appropriate documents to complete and notarized at  her discretion   HEALTH MAINTENANCE: Social History   Tobacco Use  . Smoking status: Former Smoker    Packs/day: 0.50    Years: 15.00    Pack years: 7.50    Types: Cigarettes    Start date: 04/12/2017  . Smokeless tobacco: Never Used  Substance Use Topics  .  Alcohol use: Yes    Alcohol/week: 0.0 standard drinks    Comment: social  . Drug use: No     Colonoscopy: no  PAP: [facility]. 2018  Bone density: remote   Allergies  Allergen Reactions  . Tape Rash    Rash   . Genvoya [Elviteg-Cobic-Emtricit-Tenofaf] Hives  . Lisinopril Cough  . Xylocaine [Lidocaine]     Unknown , patient states she has had lidocaine for IV starts and at the dentist with no problems  . Valsartan Cough    Pt states she tolerates medicine now    Current Outpatient Medications  Medication Sig Dispense Refill  . amLODipine (NORVASC) 10 MG tablet Take 1 tablet (10 mg total) by mouth daily. 90 tablet 0  . atorvastatin (LIPITOR) 20 MG tablet Take 1 tablet by mouth every evening for high cholesterol. NEED APPOINTMENT FOR ANY MORE REFILLS 30 tablet 0  . dexamethasone (DECADRON) 4 MG tablet Take 2 tablets (8 mg total) by mouth 2 (two) times daily. Start the day before Taxotere. Take once the day after, then 2 times a day x 2d. 30 tablet 1  . dolutegravir ([name]) 50 MG tablet Take 1 tablet (50 mg total) by mouth daily. 30 tablet 11  . doxycycline (VIBRA-TABS) 100 MG tablet Take 1 tablet (100 mg total) by mouth 2 (two) times daily. 60 tablet 5  . doxycycline (VIBRA-TABS) 100 MG tablet Take 1 tablet (100 mg total) by mouth daily. 90 tablet 2  . emtricitabine-tenofovir AF (DESCOVY) 200-25 MG tablet Take 1 tablet by mouth daily. 30 tablet 11  . glucose blood (ONETOUCH VERIO) test strip Use as instructed to test blood sugar daily NEED APPOINTMENT FOR ANY MORE REFILLS 50 each 0  . hydrALAZINE (APRESOLINE) 25 MG tablet TAKE 1 TABLET BY MOUTH THREE TIMES A DAY 270 tablet 1  .  irbesartan-hydrochlorothiazide (AVALIDE) 150-12.5 MG tablet TAKE 1 TABLET BY MOUTH ONCE DAILY FOR BLOOD PRESSURE. 90 tablet 1  . levothyroxine (SYNTHROID, LEVOTHROID) 175 MCG tablet Take 1 tablet by mouth every morning on an empty stomach with a full glass of water. No food or other medications for 30 minutes. 30 tablet 0  . lidocaine-prilocaine (EMLA) cream Apply to affected area once 30 g 3  . LORazepam (ATIVAN) 0.5 MG tablet Take 1 tablet (0.5 mg total) by mouth at bedtime as needed (Nausea or vomiting). 20 tablet 0  . metFORMIN (GLUCOPHAGE) 500 MG tablet TAKE 1 TABLET BY MOUTH TWICE A DAY WITH A MEAL (DUE FOR APPT FOR ADDITIONAL REFILLS) 60 tablet 0  . metoprolol succinate (TOPROL-XL) 50 MG 24 hr tablet TAKE 1 TABLET (50 MG TOTAL) BY MOUTH DAILY. TAKE WITH OR IMMEDIATELY FOLLOWING A MEAL. 90 tablet 1  . mometasone (NASONEX) 50 MCG/ACT nasal spray PLACE 2 SPRAYS INTO THE NOSE DAILY. 17 g 3  . Multiple Vitamin (MULTIVITAMIN) tablet Take 1 tablet by mouth daily.    . ONE TOUCH LANCETS MISC Use as instructed to test blood sugar daily 200 each 5  . oxyCODONE (OXY IR/ROXICODONE) 5 MG immediate release tablet Take 1 tablet (5 mg total) by mouth every 6 (six) hours as needed for moderate pain or severe pain. 20 tablet 0  . pantoprazole (PROTONIX) 40 MG tablet Take 40 mg by mouth daily as needed (for indigestion/acid reflux.).     . potassium chloride (K-DUR) 10 MEQ tablet Take 2 tablets (20 mEq total) by mouth 2 (two) times daily. 20 tablet 0  . PROAIR RESPICLICK 108 (90 Base) MCG/ACT   AEPB INHALE 2 PUFFS INTO THE LUNGS EVERY 6 HOURS AS NEEDED FOR WHEEZING OR SHORTNESS OF BREATH. 1 each 2  . prochlorperazine (COMPAZINE) 10 MG tablet Take 1 tablet (10 mg total) by mouth every 6 (six) hours as needed (Nausea or vomiting). 30 tablet 1  . traMADol (ULTRAM) 50 MG tablet Take 1 tablet (50 mg total) by mouth every 6 (six) hours as needed. 30 tablet 0   Current Facility-Administered Medications  Medication Dose  Route Frequency Provider Last Rate Last Dose  . sodium chloride flush (NS) 0.9 % injection 10 mL  10 mL Intracatheter PRN Magrinat, Gustav C, MD   10 mL at 08/02/18 0959   Facility-Administered Medications Ordered in Other Visits  Medication Dose Route Frequency Provider Last Rate Last Dose  . CARBOplatin (PARAPLATIN) 570 mg in sodium chloride 0.9 % 250 mL chemo infusion  570 mg Intravenous Once Magrinat, Gustav C, MD 614 mL/hr at 08/02/18 1451 570 mg at 08/02/18 1451  . heparin lock flush 100 unit/mL  500 Units Intracatheter Once PRN Magrinat, Gustav C, MD      . sodium chloride flush (NS) 0.9 % injection 10 mL  10 mL Intracatheter PRN Magrinat, Gustav C, MD        OBJECTIVE:  Vitals:   08/02/18 0921  BP: (!) 159/94  Pulse: 76  Resp: 18  Temp: 98.1 F (36.7 C)  SpO2: 99%     Body mass index is 38.94 kg/m.   Wt Readings from Last 3 Encounters:  07/12/18 234 lb (106.1 kg)  07/11/18 235 lb (106.6 kg)  06/21/18 234 lb 6.4 oz (106.3 kg)  ECOG FS:1 - Symptomatic but completely ambulatory GENERAL: Patient is a well appearing female in no acute distress HEENT:  Sclerae anicteric.  Oropharynx clear and moist. No ulcerations or evidence of oropharyngeal candidiasis. Neck is supple.  NODES:  No cervical, supraclavicular, or axillary lymphadenopathy palpated.  BREAST EXAM:  Unable to palpate upper inner breast mass, dressings on patient boils on lower inner breast right axilla clean dry and intact LUNGS:  Clear to auscultation bilaterally.  No wheezes or rhonchi. HEART:  Regular rate and rhythm. No murmur appreciated. ABDOMEN:  Soft, nontender.  Positive, normoactive bowel sounds. No organomegaly palpated. MSK:  No focal spinal tenderness to palpation. Full range of motion bilaterally in the upper extremities. EXTREMITIES:  No peripheral edema.   SKIN:  Clear with no obvious rashes or skin changes. No nail dyscrasia. Dressing over boil in right lower abdominal fold NEURO:  Nonfocal. Well  oriented.  Appropriate affect.       LAB RESULTS:  CMP     Component Value Date/Time   NA 143 08/02/2018 0923   K 3.2 (L) 08/02/2018 0923   CL 105 08/02/2018 0923   CO2 29 08/02/2018 0923   GLUCOSE 103 (H) 08/02/2018 0923   BUN 17 08/02/2018 0923   CREATININE 1.32 (H) 08/02/2018 0923   CREATININE 1.28 (H) 05/10/2018 1458   CREATININE 1.02 09/23/2015 1047   CALCIUM 9.4 08/02/2018 0923   PROT 8.0 08/02/2018 0923   ALBUMIN 3.2 (L) 08/02/2018 0923   AST 17 08/02/2018 0923   AST 28 05/10/2018 1458   ALT 14 08/02/2018 0923   ALT 23 05/10/2018 1458   ALKPHOS 120 08/02/2018 0923   BILITOT 0.3 08/02/2018 0923   BILITOT <0.2 (L) 05/10/2018 1458   GFRNONAA 46 (L) 08/02/2018 0923   GFRNONAA 48 (L) 05/10/2018 1458   GFRAA 53 (L) 08/02/2018 0923   GFRAA 56 (  L) 05/10/2018 1458    No results found for: TOTALPROTELP, ALBUMINELP, A1GS, A2GS, BETS, BETA2SER, GAMS, MSPIKE, SPEI  No results found for: KPAFRELGTCHN, LAMBDASER, KAPLAMBRATIO  Lab Results  Component Value Date   WBC 5.8 08/02/2018   NEUTROABS 2.6 08/02/2018   HGB 9.6 (L) 08/02/2018   HCT 29.6 (L) 08/02/2018   MCV 88.4 08/02/2018   PLT 247 08/02/2018    @LASTCHEMISTRY@  No results found for: LABCA2  No components found for: LABCAN125  No results for input(s): INR in the last 168 hours.  No results found for: LABCA2  No results found for: CAN199  No results found for: CAN125  No results found for: CAN153  No results found for: CA2729  No components found for: HGQUANT  No results found for: CEA1 / No results found for: CEA1   No results found for: AFPTUMOR  No results found for: CHROMOGRNA  No results found for: PSA1  Appointment on 08/02/2018  Component Date Value Ref Range Status  . WBC 08/02/2018 5.8  4.0 - 10.5 K/uL Final  . RBC 08/02/2018 3.35* 3.87 - 5.11 MIL/uL Final  . Hemoglobin 08/02/2018 9.6* 12.0 - 15.0 g/dL Final  . HCT 08/02/2018 29.6* 36.0 - 46.0 % Final  . MCV 08/02/2018 88.4   80.0 - 100.0 fL Final  . MCH 08/02/2018 28.7  26.0 - 34.0 pg Final  . MCHC 08/02/2018 32.4  30.0 - 36.0 g/dL Final  . RDW 08/02/2018 20.7* 11.5 - 15.5 % Final  . Platelets 08/02/2018 247  150 - 400 K/uL Final  . nRBC 08/02/2018 0.0  0.0 - 0.2 % Final  . Neutrophils Relative % 08/02/2018 44  % Final  . Neutro Abs 08/02/2018 2.6  1.7 - 7.7 K/uL Final  . Lymphocytes Relative 08/02/2018 43  % Final  . Lymphs Abs 08/02/2018 2.5  0.7 - 4.0 K/uL Final  . Monocytes Relative 08/02/2018 10  % Final  . Monocytes Absolute 08/02/2018 0.6  0.1 - 1.0 K/uL Final  . Eosinophils Relative 08/02/2018 1  % Final  . Eosinophils Absolute 08/02/2018 0.1  0.0 - 0.5 K/uL Final  . Basophils Relative 08/02/2018 1  % Final  . Basophils Absolute 08/02/2018 0.0  0.0 - 0.1 K/uL Final  . Immature Granulocytes 08/02/2018 1  % Final   Comment: Increased IG's, likely caused by Bone Marrow Colony Stimulating Factor received within 30 days. RARE MYELOCYTE, OC VARIANT LYMPH   . Abs Immature Granulocytes 08/02/2018 0.03  0.00 - 0.07 K/uL Final   Performed at  [facility], [address] [place], [place] [address]  . Sodium 08/02/2018 143  135 - 145 mmol/L Final  . Potassium 08/02/2018 3.2* 3.5 - 5.1 mmol/L Final  . Chloride 08/02/2018 105  98 - 111 mmol/L Final  . CO2 08/02/2018 29  22 - 32 mmol/L Final  . Glucose, Bld 08/02/2018 103* 70 - 99 mg/dL Final  . BUN 08/02/2018 17  6 - 20 mg/dL Final  . Creatinine, Ser 08/02/2018 1.32* 0.44 - 1.00 mg/dL Final  . Calcium 08/02/2018 9.4  8.9 - 10.3 mg/dL Final  . Total Protein 08/02/2018 8.0  6.5 - 8.1 g/dL Final  . Albumin 08/02/2018 3.2* 3.5 - 5.0 g/dL Final  . AST 08/02/2018 17  15 - 41 U/L Final  . ALT 08/02/2018 14  0 - 44 U/L Final  . Alkaline Phosphatase 08/02/2018 120  38 - 126 U/L Final  . Total Bilirubin 08/02/2018 0.3  0.3 - 1.2 mg/dL Final  .   GFR calc non Af Amer 08/02/2018 46* >60 mL/min Final  . GFR calc Af Amer 08/02/2018 53* >60 mL/min  Final   Comment: (NOTE) The eGFR has been calculated using the CKD EPI equation. This calculation has not been validated in all clinical situations. eGFR's persistently <60 mL/min signify possible Chronic Kidney Disease.   . Anion gap 08/02/2018 9  5 - 15 Final   Performed at [facility], [address] [place], [place] [address]    (this displays the last labs from the last 3 days)  No results found for: TOTALPROTELP, ALBUMINELP, A1GS, A2GS, BETS, BETA2SER, GAMS, MSPIKE, SPEI (this displays SPEP labs)  No results found for: KPAFRELGTCHN, LAMBDASER, KAPLAMBRATIO (kappa/lambda light chains)  No results found for: HGBA, HGBA2QUANT, HGBFQUANT, HGBSQUAN (Hemoglobinopathy evaluation)   No results found for: LDH  Lab Results  Component Value Date   IRON 26 (L) 02/08/2007   TIBC 371 02/08/2007   IRONPCTSAT 7 (L) 02/08/2007   (Iron and TIBC)  No results found for: FERRITIN  Urinalysis    Component Value Date/Time   COLORURINE YELLOW 10/21/2014 2347   APPEARANCEUR CLOUDY (A) 10/21/2014 2347   LABSPEC 1.014 10/21/2014 2347   PHURINE 6.0 10/21/2014 2347   GLUCOSEU NEGATIVE 10/21/2014 2347   GLUCOSEU NEG mg/dL 01/23/2008 1418   HGBUR LARGE (A) 10/21/2014 2347   BILIRUBINUR negative 12/29/2017 1046   KETONESUR NEGATIVE 10/21/2014 2347   PROTEINUR 2+ 12/29/2017 1046   PROTEINUR 100 (A) 10/21/2014 2347   UROBILINOGEN 0.2 12/29/2017 1046   UROBILINOGEN 0.2 10/21/2014 2347   NITRITE negative 12/29/2017 1046   NITRITE NEGATIVE 10/21/2014 2347   LEUKOCYTESUR Large (3+) (A) 12/29/2017 1046     STUDIES:   ELIGIBLE FOR AVAILABLE RESEARCH PROTOCOL: UPBEAT  ASSESSMENT: 52 y.o. [place], [place] woman status post right breast upper inner quadrant biopsy 04/26/2018 for a clinical T2 N0, stage Ib invasive ductal carcinoma, grade 2, with an MIB-1 of 70%.  (1) genetics testing 07/03/2018  (2) neoadjuvant chemotherapy will consist of carboplatin, docetaxel,  trastuzumab and Pertuzumab given every 21 days x 6, starting 05/31/2018  (a) Pertuzumab discontinued starting with cycle 2 because of diarrhea problems  (3) trastuzumab to continue to total 6 months  (a) echo on 05/16/2018 shows an EF of 60-65%.  (4) definitive surgery to follow chemotherapy  (5) adjuvant radiation to follow surgery  (6) antiestrogens to start at the completion of local treatment  (7) hidradenitis/boils: on doxycycline daily  PLAN: [name] is doing moderately well.  She is tolerating chemotherapy well for the most part.  She has had no peripheral neuropathy.  She met with Dr. Cornett, and enjoyed her visit with him.   Unfortunately [name] is distressed about issues at her work and job requirements when she is feeling poorly from chemotherapy.  I will write a letter to her work on her behalf, but requested ultimately information including a job description so I can detail better what she can and cannot do.  [name] does not want to provide this at this point, and would rather bring a letter in and see what else they might need to accommodate her treatment related side effects.    [name] will proceed with chemotherapy today.  She was recommended to return in one week for labs and f/u.    We reviewed her persistent low potassium level.  She knows to eat potassium rich foods.  We reviewed exactly what to do if she develops diarrhea again.  Should that happen and Loperamide   not help, she knows to call so we can counsel her through that.    She knows to call for any questions or concerns between now and her next appointment.  A total of (30) minutes of face-to-face time was spent with this patient with greater than 50% of that time in counseling and care-coordination.   Wilber Bihari, NP 08/02/18

## 2018-08-02 NOTE — Telephone Encounter (Signed)
No 10/17 los orders.

## 2018-08-02 NOTE — Patient Instructions (Signed)
[facility] Discharge Instructions for Patients Receiving Chemotherapy  Today you received the following chemotherapy agents Herceptin, Taxotere, and Carboplatin  To help prevent nausea and vomiting after your treatment, we encourage you to take your nausea medication as directed If you develop nausea and vomiting that is not controlled by your nausea medication, call the clinic.   BELOW ARE SYMPTOMS THAT SHOULD BE REPORTED IMMEDIATELY:  *FEVER GREATER THAN 100.5 F  *CHILLS WITH OR WITHOUT FEVER  NAUSEA AND VOMITING THAT IS NOT CONTROLLED WITH YOUR NAUSEA MEDICATION  *UNUSUAL SHORTNESS OF BREATH  *UNUSUAL BRUISING OR BLEEDING  TENDERNESS IN MOUTH AND THROAT WITH OR WITHOUT PRESENCE OF ULCERS  *URINARY PROBLEMS  *BOWEL PROBLEMS  UNUSUAL RASH Items with * indicate a potential emergency and should be followed up as soon as possible.  Feel free to call the clinic should you have any questions or concerns. The clinic phone number is [phone].  Please show the [facility] at check-in to the Emergency Department and triage nurse.

## 2018-08-02 NOTE — Patient Instructions (Signed)

## 2018-08-03 ENCOUNTER — Other Ambulatory Visit: Payer: Self-pay | Admitting: Primary Care

## 2018-08-03 DIAGNOSIS — E119 Type 2 diabetes mellitus without complications: Secondary | ICD-10-CM

## 2018-08-03 NOTE — Telephone Encounter (Signed)
Last prescribed on 04/26/2018  Last office visit on 12/29/2017

## 2018-08-03 NOTE — Telephone Encounter (Signed)
I have not seen this patient for a diabetes follow up visit in over one year. She's long overdue for an A1C. Please notify patient by phone (letter if no answer) that I need to see her in the office for diabetes follow up for any further refills. I will send a 30 day supply of metformin for now.

## 2018-08-04 ENCOUNTER — Inpatient Hospital Stay: Payer: Managed Care, Other (non HMO)

## 2018-08-04 VITALS — BP 173/98 | HR 74 | Temp 98.0°F | Resp 18

## 2018-08-04 DIAGNOSIS — Z170 Estrogen receptor positive status [ER+]: Principal | ICD-10-CM

## 2018-08-04 DIAGNOSIS — C50311 Malignant neoplasm of lower-inner quadrant of right female breast: Secondary | ICD-10-CM

## 2018-08-04 DIAGNOSIS — Z5111 Encounter for antineoplastic chemotherapy: Secondary | ICD-10-CM | POA: Diagnosis not present

## 2018-08-04 MED ORDER — PEGFILGRASTIM-CBQV 6 MG/0.6ML ~~LOC~~ SOSY
6.0000 mg | PREFILLED_SYRINGE | Freq: Once | SUBCUTANEOUS | Status: AC
  Administered 2018-08-04: 6 mg via SUBCUTANEOUS

## 2018-08-04 MED ORDER — PEGFILGRASTIM-CBQV 6 MG/0.6ML ~~LOC~~ SOSY
PREFILLED_SYRINGE | SUBCUTANEOUS | Status: AC
  Filled 2018-08-04: qty 0.6

## 2018-08-06 ENCOUNTER — Telehealth: Payer: Self-pay | Admitting: Oncology

## 2018-08-06 ENCOUNTER — Telehealth: Payer: Self-pay | Admitting: *Deleted

## 2018-08-06 NOTE — Telephone Encounter (Signed)
Needs appt in next week or two

## 2018-08-06 NOTE — Telephone Encounter (Signed)
TCT patient to follow up on numbness/tingling in her fingers.  No answer but was able to leave VM message to call back at her convenience.

## 2018-08-06 NOTE — Telephone Encounter (Signed)
Received vm message from patient to call her back this  Morning. TCT patient and spoke with her. She states that the numbness in her fingers has progressed over the weekend, so it now involves all her finger tips on her right hand and 2 fingers on her left hand. No changes to sensation in her feet/toes.  She is able to hold a coffee cup/button buttons, zip zippers at this time.  She is also c/o itching to her chest and her back though has not noticed any rash. Will pass information to Thedore Mins, NP

## 2018-08-06 NOTE — Telephone Encounter (Signed)
Spoke with patient re f/u 10/25.

## 2018-08-07 NOTE — Telephone Encounter (Signed)
lvm asking pt to call back to schedule

## 2018-08-08 ENCOUNTER — Other Ambulatory Visit: Payer: Self-pay | Admitting: Primary Care

## 2018-08-08 DIAGNOSIS — E890 Postprocedural hypothyroidism: Secondary | ICD-10-CM

## 2018-08-10 ENCOUNTER — Inpatient Hospital Stay (HOSPITAL_BASED_OUTPATIENT_CLINIC_OR_DEPARTMENT_OTHER): Payer: Managed Care, Other (non HMO) | Admitting: Adult Health

## 2018-08-10 ENCOUNTER — Encounter: Payer: Self-pay | Admitting: Adult Health

## 2018-08-10 ENCOUNTER — Inpatient Hospital Stay: Payer: Managed Care, Other (non HMO)

## 2018-08-10 VITALS — BP 147/74 | HR 85 | Temp 97.5°F | Resp 18 | Ht 65.0 in | Wt 236.6 lb

## 2018-08-10 DIAGNOSIS — Z170 Estrogen receptor positive status [ER+]: Secondary | ICD-10-CM

## 2018-08-10 DIAGNOSIS — C50311 Malignant neoplasm of lower-inner quadrant of right female breast: Secondary | ICD-10-CM

## 2018-08-10 DIAGNOSIS — R197 Diarrhea, unspecified: Secondary | ICD-10-CM

## 2018-08-10 DIAGNOSIS — G629 Polyneuropathy, unspecified: Secondary | ICD-10-CM | POA: Diagnosis not present

## 2018-08-10 DIAGNOSIS — L732 Hidradenitis suppurativa: Secondary | ICD-10-CM

## 2018-08-10 DIAGNOSIS — C50211 Malignant neoplasm of upper-inner quadrant of right female breast: Secondary | ICD-10-CM | POA: Diagnosis not present

## 2018-08-10 DIAGNOSIS — Z801 Family history of malignant neoplasm of trachea, bronchus and lung: Secondary | ICD-10-CM

## 2018-08-10 DIAGNOSIS — C73 Malignant neoplasm of thyroid gland: Secondary | ICD-10-CM

## 2018-08-10 DIAGNOSIS — Z5111 Encounter for antineoplastic chemotherapy: Secondary | ICD-10-CM | POA: Diagnosis not present

## 2018-08-10 DIAGNOSIS — Z87891 Personal history of nicotine dependence: Secondary | ICD-10-CM

## 2018-08-10 DIAGNOSIS — I10 Essential (primary) hypertension: Secondary | ICD-10-CM

## 2018-08-10 DIAGNOSIS — Z803 Family history of malignant neoplasm of breast: Secondary | ICD-10-CM

## 2018-08-10 DIAGNOSIS — L0292 Furuncle, unspecified: Secondary | ICD-10-CM

## 2018-08-10 LAB — COMPREHENSIVE METABOLIC PANEL
ALK PHOS: 162 U/L — AB (ref 38–126)
ALT: 16 U/L (ref 0–44)
AST: 19 U/L (ref 15–41)
Albumin: 3.2 g/dL — ABNORMAL LOW (ref 3.5–5.0)
Anion gap: 10 (ref 5–15)
BUN: 16 mg/dL (ref 6–20)
CALCIUM: 9.4 mg/dL (ref 8.9–10.3)
CO2: 28 mmol/L (ref 22–32)
CREATININE: 1.39 mg/dL — AB (ref 0.44–1.00)
Chloride: 105 mmol/L (ref 98–111)
GFR calc Af Amer: 50 mL/min — ABNORMAL LOW (ref >60)
GFR, EST NON AFRICAN AMERICAN: 43 mL/min — AB (ref >60)
Glucose, Bld: 140 mg/dL — ABNORMAL HIGH (ref 70–99)
POTASSIUM: 3.4 mmol/L — AB (ref 3.5–5.1)
Sodium: 143 mmol/L (ref 135–145)
TOTAL PROTEIN: 7.7 g/dL (ref 6.5–8.1)

## 2018-08-10 LAB — CBC WITH DIFFERENTIAL/PLATELET
ABS IMMATURE GRANULOCYTES: 0.48 10*3/uL — AB (ref 0.00–0.07)
BASOS ABS: 0 10*3/uL (ref 0.0–0.1)
Basophils Relative: 0 %
Eosinophils Absolute: 0 10*3/uL (ref 0.0–0.5)
Eosinophils Relative: 0 %
HCT: 29.1 % — ABNORMAL LOW (ref 36.0–46.0)
HEMOGLOBIN: 9.2 g/dL — AB (ref 12.0–15.0)
Immature Granulocytes: 4 %
Lymphocytes Relative: 33 %
Lymphs Abs: 3.8 10*3/uL (ref 0.7–4.0)
MCH: 29.2 pg (ref 26.0–34.0)
MCHC: 31.6 g/dL (ref 30.0–36.0)
MCV: 92.4 fL (ref 80.0–100.0)
MONO ABS: 1.5 10*3/uL — AB (ref 0.1–1.0)
Monocytes Relative: 13 %
Neutro Abs: 5.6 10*3/uL (ref 1.7–7.7)
Neutrophils Relative %: 50 %
PLATELETS: 274 10*3/uL (ref 150–400)
RBC: 3.15 MIL/uL — ABNORMAL LOW (ref 3.87–5.11)
RDW: 20.9 % — ABNORMAL HIGH (ref 11.5–15.5)
WBC: 11.3 10*3/uL — AB (ref 4.0–10.5)
nRBC: 0.4 % — ABNORMAL HIGH (ref 0.0–0.2)

## 2018-08-10 MED ORDER — LEVOTHYROXINE SODIUM 175 MCG PO TABS: ORAL_TABLET | ORAL | 0 refills | Status: DC

## 2018-08-10 NOTE — Patient Instructions (Signed)
Gemcitabine injection What is this medicine? GEMCITABINE (jem SIT a been) is a chemotherapy drug. This medicine is used to treat many types of cancer like breast cancer, lung cancer, pancreatic cancer, and ovarian cancer. This medicine may be used for other purposes; ask your health care provider or pharmacist if you have questions. COMMON BRAND NAME(S): Gemzar What should I tell my health care provider before I take this medicine? They need to know if you have any of these conditions: -blood disorders -infection -kidney disease -liver disease -recent or ongoing radiation therapy -an unusual or allergic reaction to gemcitabine, other chemotherapy, other medicines, foods, dyes, or preservatives -pregnant or trying to get pregnant -breast-feeding How should I use this medicine? This drug is given as an infusion into a vein. It is administered in a hospital or clinic by a specially trained health care professional. Talk to your pediatrician regarding the use of this medicine in children. Special care may be needed. Overdosage: If you think you have taken too much of this medicine contact a poison control center or emergency room at once. NOTE: This medicine is only for you. Do not share this medicine with others. What if I miss a dose? It is important not to miss your dose. Call your doctor or health care professional if you are unable to keep an appointment. What may interact with this medicine? -medicines to increase blood counts like filgrastim, pegfilgrastim, sargramostim -some other chemotherapy drugs like cisplatin -vaccines Talk to your doctor or health care professional before taking any of these medicines: -acetaminophen -aspirin -ibuprofen -ketoprofen -naproxen This list may not describe all possible interactions. Give your health care provider a list of all the medicines, herbs, non-prescription drugs, or dietary supplements you use. Also tell them if you smoke, drink alcohol,  or use illegal drugs. Some items may interact with your medicine. What should I watch for while using this medicine? Visit your doctor for checks on your progress. This drug may make you feel generally unwell. This is not uncommon, as chemotherapy can affect healthy cells as well as cancer cells. Report any side effects. Continue your course of treatment even though you feel ill unless your doctor tells you to stop. In some cases, you may be given additional medicines to help with side effects. Follow all directions for their use. Call your doctor or health care professional for advice if you get a fever, chills or sore throat, or other symptoms of a cold or flu. Do not treat yourself. This drug decreases your body's ability to fight infections. Try to avoid being around people who are sick. This medicine may increase your risk to bruise or bleed. Call your doctor or health care professional if you notice any unusual bleeding. Be careful brushing and flossing your teeth or using a toothpick because you may get an infection or bleed more easily. If you have any dental work done, tell your dentist you are receiving this medicine. Avoid taking products that contain aspirin, acetaminophen, ibuprofen, naproxen, or ketoprofen unless instructed by your doctor. These medicines may hide a fever. Women should inform their doctor if they wish to become pregnant or think they might be pregnant. There is a potential for serious side effects to an unborn child. Talk to your health care professional or pharmacist for more information. Do not breast-feed an infant while taking this medicine. What side effects may I notice from receiving this medicine? Side effects that you should report to your doctor or health care professional as   soon as possible: -allergic reactions like skin rash, itching or hives, swelling of the face, lips, or tongue -low blood counts - this medicine may decrease the number of white blood cells,  red blood cells and platelets. You may be at increased risk for infections and bleeding. -signs of infection - fever or chills, cough, sore throat, pain or difficulty passing urine -signs of decreased platelets or bleeding - bruising, pinpoint red spots on the skin, black, tarry stools, blood in the urine -signs of decreased red blood cells - unusually weak or tired, fainting spells, lightheadedness -breathing problems -chest pain -mouth sores -nausea and vomiting -pain, swelling, redness at site where injected -pain, tingling, numbness in the hands or feet -stomach pain -swelling of ankles, feet, hands -unusual bleeding Side effects that usually do not require medical attention (report to your doctor or health care professional if they continue or are bothersome): -constipation -diarrhea -hair loss -loss of appetite -stomach upset This list may not describe all possible side effects. Call your doctor for medical advice about side effects. You may report side effects to FDA at 1-800-FDA-1088. Where should I keep my medicine? This drug is given in a hospital or clinic and will not be stored at home. NOTE: This sheet is a summary. It may not cover all possible information. If you have questions about this medicine, talk to your doctor, pharmacist, or health care provider.  2018 [employer] (2008-02-12 18:45:54)  

## 2018-08-10 NOTE — Telephone Encounter (Signed)
Last prescribed on 07/11/2018 Last office visit on 12/29/2017

## 2018-08-10 NOTE — Telephone Encounter (Signed)
Patient did schedule a appt on 08/14/2018. Also I think the last time we suggested endo, she did not want to go at that time but that was months ago.

## 2018-08-10 NOTE — Telephone Encounter (Signed)
Noted, refill sent to pharmacy. 

## 2018-08-10 NOTE — Telephone Encounter (Signed)
Unfortunately she has not responded to our multiple attempts at repeating her TSH so I will have to decline the Rx. If she schedules an appointment I will refill. Is she seeing anyone else for her thyroid?

## 2018-08-10 NOTE — Progress Notes (Signed)
[facility]  [phone] [phone]     ID: LUDELLA PRANGER DOB: 1973-08-28  MR#: [id number]  FSF#:[id number]  Patient Care Team: [name], NP as PCP - General (Internal Medicine) Michel Bickers, MD as PCP - Infectious Diseases (Infectious Diseases) Magrinat, Virgie Dad, MD as Consulting Physician (Oncology) Kyung Rudd, MD as Consulting Physician (Radiation Oncology) Coralie Keens, MD as Consulting Physician (General Surgery) OTHER MD:   CHIEF COMPLAINT: Triple positive breast cancer  CURRENT TREATMENT: Neoadjuvant chemotherapy  INTERVAL HISTORY: Wandy returns today for follow up and treatment of her triple positive breast cancer, accompanied by her daughter. She receives docetaxel, carboplatin, trastuzumab, and pertuzumab every 21 days, with today being day 9 cycle 4. She also receives [place] on day 3 of every cycle. Pertuzumab held after cycle 1 due to diarrhea.    REVIEW OF SYSTEMS: Bexleigh is doing moderately well today.  She noted a new onset of numbness and tingling in her fingertips and toes.  She called in and talked to our nursing staff about this and we recommended an appointment.  She notes that this started on Friday.  She says it was a funny feeling.  However Sunday morning it registered triggering her to call us on Monday.  She notes a constant numbness in her fingertips of her right hand, and three of her digits in her left hand.    She started having diarrhea 4-5 times per day this past Wednesday and it has continued.  She is not taking imodium.  Otherwise, Juneau is doing well and a detailed ROS is otherwise non contributory.     HISTORY OF CURRENT ILLNESS: From the original intake note:  LONDYN HOTARD (pronounced "Huff") palpated a mass in the right breast for about 2 weeks before bringing it to medical attention. She underwent bilateral diagnostic mammography with tomography and right breast ultrasonography at [facility] on 04/20/2018 showing: breast density category B. There was a highly suspicious mass located at 3 o'clock in the upper inner quadrant measuring 2.9 x 2.5 x 1.6 cm and 4 cm from the nipple. Ultrasound of the right axilla showed 5 lymph nodes with abnormal cortical thickening. The left axilla showed 3 lymph nodes with abnormal cortical thickening.  Accordingly on 04/26/2018 she proceeded to biopsy of the right breast area in question. The pathology from this procedure showed ([id number]): Invasive ductal carcinoma, grade II with calcifications. One right axillary lymph node and one left axillary lymph node were negative for carcinoma. Prognostic indicators significant for: estrogen receptor, 100% positive and progesterone receptor, 80% positive, both with strong staining intensity. Proliferation marker Ki67 at 70%. HER2 amplified with ratios HER2/CEP17 signals 4.52 and average HER2 copies per cell 10.85  The patient's subsequent history is as detailed below.    PAST MEDICAL HISTORY: Past Medical History:  Diagnosis Date  . Anemia    Normocytic  . Anxiety   . Asthma   . Blood dyscrasia   . Bronchitis 2005  . 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 Diabetes mellitus without complication ([place])   . Family history of breast cancer   . Family history of lung cancer   . Family history of prostate cancer   . Gastroenteritis 07/10/07  . GERD 07/24/2006  . GRAVE'S DISEASE 01/01/2008  . History of hidradenitis suppurativa   . History of kidney stones   . History of thrush   . HIV DISEASE 07/24/2006   dx March 05  . HYPERTENSION 07/24/2006  . Hyperthyroidism  08/2006   Grave's Disease -diffuse radiotracer uptake 08/25/06 Thyroid scan-Cold nodule to R lower lobe of thyrorid  . Menometrorrhagia    hx of  . Nephrolithiasis   . 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 Pneumonia 2005  . 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 Sarcoidosis 02/08/2007   dx as a teenager in [place] from abnl CXR. Completed 2 yrs Prednisone after lung bx confirmation. No symptoms since then.  . Thyroid cancer ([place])   . THYROID NODULE, RIGHT 02/08/2007  -2018 thyroid nodules were precancerous but pathology was negative for thyroid cancer.   PAST SURGICAL HISTORY: Past Surgical History:  Procedure Laterality Date  . BREAST SURGERY  1997   Breast Reduction   . CYSTOSCOPY W/ URETERAL STENT REMOVAL  11/09/2012   Procedure: CYSTOSCOPY WITH STENT REMOVAL;  Surgeon: Alexis Frock, MD;  Location: [facility];  Service: Urology;  Laterality: Right;  . CYSTOSCOPY WITH RETROGRADE PYELOGRAM, URETEROSCOPY AND STENT PLACEMENT  11/09/2012   Procedure: CYSTOSCOPY WITH RETROGRADE PYELOGRAM, URETEROSCOPY AND STENT PLACEMENT;  Surgeon: Alexis Frock, MD;  Location[facility];  Service: Urology;  Laterality: Left;  LEFT URETEROSCOPY, STONE MANIPULATION, left STENT exchange   . CYSTOSCOPY WITH STENT PLACEMENT  10/02/2012   Procedure: CYSTOSCOPY WITH STENT PLACEMENT;  Surgeon: Alexis Frock, MD;  [facility];  Service: Urology;  Laterality: Left;  . DILATION AND CURETTAGE OF UTERUS  Feb 2004   s/p for 1st trimester nonviable pregnancy  . EYE SURGERY     sty under eyelid  . INCISE AND DRAIN ABCESS  Nov 03   s/p I &D for righ inframmary fold hidradenitis  . INCISION AND DRAINAGE PERITONSILLAR ABSCESS  Mar 03  . IR CV LINE INJECTION  06/07/2018  . IR IMAGING GUIDED PORT INSERTION  06/20/2018  . IR REMOVAL TUN ACCESS W/ PORT W/O FL MOD SED  06/20/2018  . IRRIGATION AND DEBRIDEMENT ABSCESS  01/31/2012   Procedure: IRRIGATION AND DEBRIDEMENT ABSCESS;  Surgeon: Shann Medal, MD;  Location: [facility];  Service: General;  Laterality: Right;  right breast and axilla   . NEPHROLITHOTOMY  10/02/2012   Procedure: NEPHROLITHOTOMY PERCUTANEOUS;  Surgeon: Alexis Frock, MD;  [facility];  Service: Urology;  Laterality: Right;  First Stage Percutaneous  Nephrolithotomy with Surgeon Access, Left Ureteral Stent    . NEPHROLITHOTOMY  10/04/2012   Procedure: NEPHROLITHOTOMY PERCUTANEOUS SECOND LOOK;  Surgeon: Alexis Frock, MD;  [facility];  Service: Urology;  Laterality: Right;     . NEPHROLITHOTOMY  10/08/2012   Procedure: NEPHROLITHOTOMY PERCUTANEOUS;  Surgeon: Alexis Frock, MD;  [facility];  Service: Urology;  Laterality: Right;  THIRD STAGE, nephrostomy tube exchange x 2  . NEPHROLITHOTOMY  10/11/2012   Procedure: NEPHROLITHOTOMY PERCUTANEOUS SECOND LOOK;  Surgeon: Alexis Frock, MD;  [facility];  Service: Urology;  Laterality: Right;  RIGHT 4 STAGE PERCUTANOUS NEPHROLITHOTOMY, right URETEROSCOPY WITH HOLMIUM LASER   . PORTACATH PLACEMENT Left 05/17/2018   Procedure: INSERTION PORT-A-CATH;  Surgeon: Coralie Keens, MD;  Location: [facility];  Service: General;  Laterality: Left;  . RADICAL NECK DISSECTION  04/12/2017   limited/notes 04/12/2017  . RADICAL NECK DISSECTION N/A 04/12/2017   Procedure: RADICAL NECK DISSECTION;  Surgeon: Melida Quitter, MD;  Location: [facility];  Service: ENT;  Laterality: N/A;  limited neck dissection 2 hours total  . REDUCTION MAMMAPLASTY Bilateral 1998  . [place]  1994  . THYROIDECTOMY  04/12/2017   completion/notes 04/12/2017  .  THYROIDECTOMY N/A 04/12/2017   Procedure: THYROIDECTOMY;  Surgeon: Melida Quitter, MD;  Location: [facility];  Service: ENT;  Laterality: N/A;  Completion Thyroidectomy  . TOTAL THYROIDECTOMY  2010  Thyroid nodules removed- pathology at the ENT doctor 2018 were benign -Over active thyroid and thyroidectomy with benign pathology  FAMILY HISTORY Family History  Problem Relation Age of Onset  . Hypertension Mother   . Cancer Mother        laryngeal  . Heart disease Mother        stent  . Hypertension Father   . Lung cancer Father 45       hx smoking  . Heart disease Other   . Hypertension Other   . Stroke Other        Grandparent  . Kidney disease  Other        Grandparent  . Diabetes Other        FH of Diabetes  . Hypertension Sister   . Cancer Maternal Uncle        Lung CA  . Hypertension Brother   . Hypertension Sister   . Breast cancer Maternal Aunt 65  . Breast cancer Paternal Aunt 88  . Prostate cancer Paternal Uncle   . Breast cancer Maternal Aunt        dx 60+  . Breast cancer Paternal Aunt        dx 23's  . Breast cancer Paternal Aunt        dx 86's  . Prostate cancer Paternal Uncle   . Lung cancer Paternal Uncle   . Breast cancer Cousin 58  . Breast cancer Cousin        dx <50  . Breast cancer Cousin        dx <50  . Breast cancer Cousin        dx <50  The patient's mother is alive at age 77. The patient's father died at 12 from lung cancer (heavy smoker). She does not know much about her father. The patient has 1 brother and 2 sisters. The patient's mother had a history of laryngeal cancer. There was a maternal cousin with breast cancer diagnosed at age 78. There were 2 maternal aunts with breast cancer, the youngest being diagnosed in the 21's. There was a maternal great aunt with breast cancer. There were 2 paternal aunts with breast cancer.    GYNECOLOGIC HISTORY:  Patient's last menstrual period was 03/31/2014. Menarche: 52 years old Age at first live birth: 52 years old She is GX P1.  Her LMP was in 2017. She did not use HRT.    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 At home is the patient's daughter, Verdie Drown, age 67, who is a Engineer, technical sales education and working part-time at the [employer]. The patient attends [employer].     ADVANCED DIRECTIVES: Not in place; at the 05/10/2018 visit the patient was given the appropriate documents to complete and notarized at her discretion   HEALTH MAINTENANCE: Social History   Tobacco Use  . Smoking status: Former Smoker    Packs/day: 0.50    Years: 15.00    Pack years: 7.50    Types: Cigarettes     Start date: 04/12/2017  . Smokeless tobacco: Never Used  Substance Use Topics  . Alcohol use: Yes    Alcohol/week: 0.0 standard drinks    Comment: social  . Drug use: No     Colonoscopy: no  PAP: [facility]. 2018  Bone density: remote   Allergies  Allergen Reactions  . Tape Rash    Rash   . Genvoya [Elviteg-Cobic-Emtricit-Tenofaf] Hives  . Lisinopril Cough  . Xylocaine [Lidocaine]     Unknown , patient states she has had lidocaine for IV starts and at the dentist with no problems  . Valsartan Cough    Pt states she tolerates medicine now    Current Outpatient Medications  Medication Sig Dispense Refill  . amLODipine (NORVASC) 10 MG tablet Take 1 tablet (10 mg total) by mouth daily. 90 tablet 0  . atorvastatin (LIPITOR) 20 MG tablet Take 1 tablet by mouth every evening for high cholesterol. NEED APPOINTMENT FOR ANY MORE REFILLS 30 tablet 0  . dexamethasone (DECADRON) 4 MG tablet Take 2 tablets (8 mg total) by mouth 2 (two) times daily. Start the day before Taxotere. Take once the day after, then 2 times a day x 2d. 30 tablet 1  . dolutegravir ([name]) 50 MG tablet Take 1 tablet (50 mg total) by mouth daily. 30 tablet 11  . doxycycline (VIBRA-TABS) 100 MG tablet Take 1 tablet (100 mg total) by mouth 2 (two) times daily. 60 tablet 5  . doxycycline (VIBRA-TABS) 100 MG tablet Take 1 tablet (100 mg total) by mouth daily. 90 tablet 2  . emtricitabine-tenofovir AF (DESCOVY) 200-25 MG tablet Take 1 tablet by mouth daily. 30 tablet 11  . glucose blood (ONETOUCH VERIO) test strip Use as instructed to test blood sugar daily NEED APPOINTMENT FOR ANY MORE REFILLS 50 each 0  . hydrALAZINE (APRESOLINE) 25 MG tablet TAKE 1 TABLET BY MOUTH THREE TIMES A DAY 270 tablet 1  . irbesartan-hydrochlorothiazide (AVALIDE) 150-12.5 MG tablet TAKE 1 TABLET BY MOUTH ONCE DAILY FOR BLOOD PRESSURE. 90 tablet 1  . levothyroxine (SYNTHROID, LEVOTHROID) 175 MCG tablet Take 1 tablet by mouth every  morning on an empty stomach with a full glass of water. No food or other medications for 30 minutes. 30 tablet 0  . lidocaine-prilocaine (EMLA) cream Apply to affected area once 30 g 3  . LORazepam (ATIVAN) 0.5 MG tablet Take 1 tablet (0.5 mg total) by mouth at bedtime as needed (Nausea or vomiting). 20 tablet 0  . metFORMIN (GLUCOPHAGE) 500 MG tablet TAKE 1 TABLET BY MOUTH TWICE A DAY WITH A MEAL (DUE FOR APPT FOR ADDITIONAL REFILLS) 60 tablet 0  . metoprolol succinate (TOPROL-XL) 50 MG 24 hr tablet TAKE 1 TABLET (50 MG TOTAL) BY MOUTH DAILY. TAKE WITH OR IMMEDIATELY FOLLOWING A MEAL. 90 tablet 1  . mometasone (NASONEX) 50 MCG/ACT nasal spray PLACE 2 SPRAYS INTO THE NOSE DAILY. 17 g 3  . Multiple Vitamin (MULTIVITAMIN) tablet Take 1 tablet by mouth daily.    . ONE TOUCH LANCETS MISC Use as instructed to test blood sugar daily 200 each 5  . oxyCODONE (OXY IR/ROXICODONE) 5 MG immediate release tablet Take 1 tablet (5 mg total) by mouth every 6 (six) hours as needed for moderate pain or severe pain. 20 tablet 0  . pantoprazole (PROTONIX) 40 MG tablet Take 40 mg by mouth daily as needed (for indigestion/acid reflux.).     Marland Kitchen potassium chloride (K-DUR) 10 MEQ tablet Take 2 tablets (20 mEq total) by mouth 2 (two) times daily. 20 tablet 0  . PROAIR RESPICLICK 672 (90 Base) MCG/ACT AEPB INHALE 2 PUFFS INTO THE LUNGS EVERY 6 HOURS AS NEEDED FOR WHEEZING OR SHORTNESS OF BREATH. 1 each 2  . prochlorperazine (COMPAZINE) 10 MG  tablet Take 1 tablet (10 mg total) by mouth every 6 (six) hours as needed (Nausea or vomiting). 30 tablet 1  . traMADol (ULTRAM) 50 MG tablet Take 1 tablet (50 mg total) by mouth every 6 (six) hours as needed. 30 tablet 0   No current facility-administered medications for this visit.     OBJECTIVE:  There were no vitals filed for this visit.   There is no height or weight on file to calculate BMI.   Wt Readings from Last 3 Encounters:  07/12/18 234 lb (106.1 kg)  07/11/18 235 lb  (106.6 kg)  06/21/18 234 lb 6.4 oz (106.3 kg)  ECOG FS:1 - Symptomatic but completely ambulatory GENERAL: Patient is a well appearing female in no acute distress HEENT:  Sclerae anicteric.  Oropharynx clear and moist. No ulcerations or evidence of oropharyngeal candidiasis. Neck is supple.  NODES:  No cervical, supraclavicular, or axillary lymphadenopathy palpated.  BREAST EXAM:  Unchanged, no sign of progression LUNGS:  Clear to auscultation bilaterally.  No wheezes or rhonchi. HEART:  Regular rate and rhythm. No murmur appreciated. ABDOMEN:  Soft, nontender.  Positive, normoactive bowel sounds. No organomegaly palpated. MSK:  No focal spinal tenderness to palpation. Full range of motion bilaterally in the upper extremities. EXTREMITIES:  No peripheral edema.   SKIN:  Clear with no obvious rashes or skin changes. No nail dyscrasia. Dressing over boil in right lower abdominal fold NEURO:  Nonfocal. Well oriented.  Appropriate affect.       LAB RESULTS:  CMP     Component Value Date/Time   NA 143 08/02/2018 0923   K 3.2 (L) 08/02/2018 0923   CL 105 08/02/2018 0923   CO2 29 08/02/2018 0923   GLUCOSE 103 (H) 08/02/2018 0923   BUN 17 08/02/2018 0923   CREATININE 1.32 (H) 08/02/2018 0923   CREATININE 1.28 (H) 05/10/2018 1458   CREATININE 1.02 09/23/2015 1047   CALCIUM 9.4 08/02/2018 0923   PROT 8.0 08/02/2018 0923   ALBUMIN 3.2 (L) 08/02/2018 0923   AST 17 08/02/2018 0923   AST 28 05/10/2018 1458   ALT 14 08/02/2018 0923   ALT 23 05/10/2018 1458   ALKPHOS 120 08/02/2018 0923   BILITOT 0.3 08/02/2018 0923   BILITOT <0.2 (L) 05/10/2018 1458   GFRNONAA 46 (L) 08/02/2018 0923   GFRNONAA 48 (L) 05/10/2018 1458   GFRAA 53 (L) 08/02/2018 0923   GFRAA 56 (L) 05/10/2018 1458    No results found for: TOTALPROTELP, ALBUMINELP, A1GS, A2GS, BETS, BETA2SER, GAMS, MSPIKE, SPEI  No results found for: KPAFRELGTCHN, LAMBDASER, KAPLAMBRATIO  Lab Results  Component Value Date   WBC 5.8  08/02/2018   NEUTROABS 2.6 08/02/2018   HGB 9.6 (L) 08/02/2018   HCT 29.6 (L) 08/02/2018   MCV 88.4 08/02/2018   PLT 247 08/02/2018    '@LASTCHEMISTRY'$ @  No results found for: LABCA2  No components found for: [id number]  No results for input(s): INR in the last 168 hours.  No results found for: LABCA2  No results found for: WUJ811  No results found for: BJY782  No results found for: NFA213  No results found for: CA2729  No components found for: HGQUANT  No results found for: CEA1 / No results found for: CEA1   No results found for: AFPTUMOR  No results found for: CHROMOGRNA  No results found for: PSA1  No visits with results within 3 Day(s) from this visit.  Latest known visit with results is:  Appointment on 08/02/2018  Component Date  Value Ref Range Status  . WBC 08/02/2018 5.8  4.0 - 10.5 K/uL Final  . RBC 08/02/2018 3.35* 3.87 - 5.11 MIL/uL Final  . Hemoglobin 08/02/2018 9.6* 12.0 - 15.0 g/dL Final  . HCT 08/02/2018 29.6* 36.0 - 46.0 % Final  . MCV 08/02/2018 88.4  80.0 - 100.0 fL Final  . MCH 08/02/2018 28.7  26.0 - 34.0 pg Final  . MCHC 08/02/2018 32.4  30.0 - 36.0 g/dL Final  . RDW 08/02/2018 20.7* 11.5 - 15.5 % Final  . Platelets 08/02/2018 247  150 - 400 K/uL Final  . nRBC 08/02/2018 0.0  0.0 - 0.2 % Final  . Neutrophils Relative % 08/02/2018 44  % Final  . Neutro Abs 08/02/2018 2.6  1.7 - 7.7 K/uL Final  . Lymphocytes Relative 08/02/2018 43  % Final  . Lymphs Abs 08/02/2018 2.5  0.7 - 4.0 K/uL Final  . Monocytes Relative 08/02/2018 10  % Final  . Monocytes Absolute 08/02/2018 0.6  0.1 - 1.0 K/uL Final  . Eosinophils Relative 08/02/2018 1  % Final  . Eosinophils Absolute 08/02/2018 0.1  0.0 - 0.5 K/uL Final  . Basophils Relative 08/02/2018 1  % Final  . Basophils Absolute 08/02/2018 0.0  0.0 - 0.1 K/uL Final  . Immature Granulocytes 08/02/2018 1  % Final   Comment: Increased IG's, likely caused by Bone Marrow Colony Stimulating Factor received within  30 days. RARE MYELOCYTE, OC VARIANT LYMPH   . Abs Immature Granulocytes 08/02/2018 0.03  0.00 - 0.07 K/uL Final   Performed at [facility], [place] [address], [place], [place] [address]  . Sodium 08/02/2018 143  135 - 145 mmol/L Final  . Potassium 08/02/2018 3.2* 3.5 - 5.1 mmol/L Final  . Chloride 08/02/2018 105  98 - 111 mmol/L Final  . CO2 08/02/2018 29  22 - 32 mmol/L Final  . Glucose, Bld 08/02/2018 103* 70 - 99 mg/dL Final  . BUN 08/02/2018 17  6 - 20 mg/dL Final  . Creatinine, Ser 08/02/2018 1.32* 0.44 - 1.00 mg/dL Final  . Calcium 08/02/2018 9.4  8.9 - 10.3 mg/dL Final  . Total Protein 08/02/2018 8.0  6.5 - 8.1 g/dL Final  . Albumin 08/02/2018 3.2* 3.5 - 5.0 g/dL Final  . AST 08/02/2018 17  15 - 41 U/L Final  . ALT 08/02/2018 14  0 - 44 U/L Final  . Alkaline Phosphatase 08/02/2018 120  38 - 126 U/L Final  . Total Bilirubin 08/02/2018 0.3  0.3 - 1.2 mg/dL Final  . GFR calc non Af Amer 08/02/2018 46* >60 mL/min Final  . GFR calc Af Amer 08/02/2018 53* >60 mL/min Final   Comment: (NOTE) The eGFR has been calculated using the CKD EPI equation. This calculation has not been validated in all clinical situations. eGFR's persistently <60 mL/min signify possible Chronic Kidney Disease.   Georgiann Hahn gap 08/02/2018 9  5 - 15 Final   Performed at [facility], [place] [address], [place], [place] [address]    (this displays the last labs from the last 3 days)  No results found for: TOTALPROTELP, ALBUMINELP, A1GS, A2GS, BETS, BETA2SER, GAMS, MSPIKE, SPEI (this displays SPEP labs)  No results found for: KPAFRELGTCHN, LAMBDASER, KAPLAMBRATIO (kappa/lambda light chains)  No results found for: HGBA, HGBA2QUANT, HGBFQUANT, HGBSQUAN (Hemoglobinopathy evaluation)   No results found for: LDH  Lab Results  Component Value Date   IRON 26 (L) 02/08/2007   TIBC 371 02/08/2007   IRONPCTSAT 7 (L)  02/08/2007   (Iron and TIBC)  No results found  for: FERRITIN  Urinalysis    Component Value Date/Time   COLORURINE YELLOW 10/21/2014 2347   APPEARANCEUR CLOUDY (A) 10/21/2014 2347   LABSPEC 1.014 10/21/2014 2347   PHURINE 6.0 10/21/2014 2347   GLUCOSEU NEGATIVE 10/21/2014 2347   GLUCOSEU NEG mg/dL 01/23/2008 1418   HGBUR LARGE (A) 10/21/2014 2347   BILIRUBINUR negative 12/29/2017 1046   KETONESUR NEGATIVE 10/21/2014 2347   PROTEINUR 2+ 12/29/2017 1046   PROTEINUR 100 (A) 10/21/2014 2347   UROBILINOGEN 0.2 12/29/2017 1046   UROBILINOGEN 0.2 10/21/2014 2347   NITRITE negative 12/29/2017 1046   NITRITE NEGATIVE 10/21/2014 2347   LEUKOCYTESUR Large (3+) (A) 12/29/2017 1046     STUDIES:   ELIGIBLE FOR AVAILABLE RESEARCH PROTOCOL: UPBEAT  ASSESSMENT: 52 y.o. [place], [place] woman status post right breast upper inner quadrant biopsy 04/26/2018 for a clinical T2 N0, stage Ib invasive ductal carcinoma, grade 2, with an MIB-1 of 70%.  (1) genetics testing 07/03/2018  (2) neoadjuvant chemotherapy will consist of carboplatin, docetaxel, trastuzumab and Pertuzumab given every 21 days x 6, starting 05/31/2018  (a) Pertuzumab discontinued starting with cycle 2 because of diarrhea problems  (3) trastuzumab to continue to total 6 months  (a) echo on 05/16/2018 shows an EF of 60-65%.  (4) definitive surgery to follow chemotherapy  (5) adjuvant radiation to follow surgery  (6) antiestrogens to start at the completion of local treatment  (7) hidradenitis/boils: on doxycycline daily  PLAN: Mette is doing moderately well.  Unfortunately, she has developed a new onset of constant peripheral neruoapthy.  I reviewed this with Dr. [name].  Per Dr. [name] we will discontinue her Docetaxel and start her on Gemcitabine with her last two cycles.  I reviewed this with Leocadia in detail.    Lilliona also has had diarrhea.  I let her know that 4-5 times per day was too often to be having diarrhea.  I instructed her to take two imodium with her  next diarrhea episode and that she should only be having 1-2 bowel movements per day.  We reviewed how to take imodium.  Due to her continued diarrhea, I added a lab appointment on today.  At the end of the appointment she noted some more emotional issues and feeling overwhelmed.  She and I reviewed stress relieving techniques.  She has no suicidal thoughts, or issues that are so significant they are interfering with her activities of daily living.    Khalea will return in 2 weeks for labs, f/u, and her next treatment.  She knows to call for any questions or concerns between now and her next appointment.  A total of (30) minutes of face-to-face time was spent with this patient with greater than 50% of that time in counseling and care-coordination.   Wilber Bihari, NP 08/10/18

## 2018-08-14 ENCOUNTER — Ambulatory Visit: Payer: Self-pay | Admitting: Primary Care

## 2018-08-20 ENCOUNTER — Other Ambulatory Visit: Payer: Self-pay | Admitting: Primary Care

## 2018-08-21 ENCOUNTER — Other Ambulatory Visit: Payer: Self-pay | Admitting: Adult Health

## 2018-08-21 DIAGNOSIS — C50311 Malignant neoplasm of lower-inner quadrant of right female breast: Secondary | ICD-10-CM

## 2018-08-21 DIAGNOSIS — Z170 Estrogen receptor positive status [ER+]: Principal | ICD-10-CM

## 2018-08-22 ENCOUNTER — Other Ambulatory Visit: Payer: Self-pay | Admitting: Adult Health

## 2018-08-22 ENCOUNTER — Telehealth: Payer: Self-pay

## 2018-08-22 DIAGNOSIS — C50211 Malignant neoplasm of upper-inner quadrant of right female breast: Secondary | ICD-10-CM

## 2018-08-22 DIAGNOSIS — Z170 Estrogen receptor positive status [ER+]: Principal | ICD-10-CM

## 2018-08-22 DIAGNOSIS — C50311 Malignant neoplasm of lower-inner quadrant of right female breast: Secondary | ICD-10-CM

## 2018-08-22 DIAGNOSIS — C50911 Malignant neoplasm of unspecified site of right female breast: Secondary | ICD-10-CM

## 2018-08-22 NOTE — Telephone Encounter (Signed)
This nurse spoke with Velna Hatchet ([facility] [employer]) concerning pre auth for Echo but pre auth not needed.

## 2018-08-22 NOTE — Telephone Encounter (Signed)
Patient made aware of Echo appt on Monday 08/27/18 at 1 pm at [facility]. She voiced understanding.

## 2018-08-23 ENCOUNTER — Telehealth: Payer: Self-pay | Admitting: Adult Health

## 2018-08-23 ENCOUNTER — Other Ambulatory Visit: Payer: Self-pay | Admitting: Oncology

## 2018-08-23 ENCOUNTER — Encounter: Payer: Self-pay | Admitting: Adult Health

## 2018-08-23 ENCOUNTER — Inpatient Hospital Stay (HOSPITAL_BASED_OUTPATIENT_CLINIC_OR_DEPARTMENT_OTHER): Payer: PRIVATE HEALTH INSURANCE | Admitting: Adult Health

## 2018-08-23 ENCOUNTER — Inpatient Hospital Stay: Payer: PRIVATE HEALTH INSURANCE

## 2018-08-23 ENCOUNTER — Inpatient Hospital Stay: Payer: PRIVATE HEALTH INSURANCE | Attending: Oncology

## 2018-08-23 ENCOUNTER — Other Ambulatory Visit: Payer: Self-pay

## 2018-08-23 ENCOUNTER — Encounter: Payer: Self-pay | Admitting: *Deleted

## 2018-08-23 VITALS — BP 163/88 | HR 73 | Temp 98.3°F | Resp 18 | Ht 65.0 in | Wt 232.7 lb

## 2018-08-23 DIAGNOSIS — L732 Hidradenitis suppurativa: Secondary | ICD-10-CM | POA: Diagnosis not present

## 2018-08-23 DIAGNOSIS — C50311 Malignant neoplasm of lower-inner quadrant of right female breast: Secondary | ICD-10-CM

## 2018-08-23 DIAGNOSIS — Z5189 Encounter for other specified aftercare: Secondary | ICD-10-CM | POA: Insufficient documentation

## 2018-08-23 DIAGNOSIS — Z170 Estrogen receptor positive status [ER+]: Secondary | ICD-10-CM

## 2018-08-23 DIAGNOSIS — C50211 Malignant neoplasm of upper-inner quadrant of right female breast: Secondary | ICD-10-CM

## 2018-08-23 DIAGNOSIS — G629 Polyneuropathy, unspecified: Secondary | ICD-10-CM

## 2018-08-23 DIAGNOSIS — Z5112 Encounter for antineoplastic immunotherapy: Secondary | ICD-10-CM | POA: Diagnosis present

## 2018-08-23 DIAGNOSIS — C73 Malignant neoplasm of thyroid gland: Secondary | ICD-10-CM

## 2018-08-23 DIAGNOSIS — Z801 Family history of malignant neoplasm of trachea, bronchus and lung: Secondary | ICD-10-CM

## 2018-08-23 DIAGNOSIS — Z5111 Encounter for antineoplastic chemotherapy: Secondary | ICD-10-CM | POA: Insufficient documentation

## 2018-08-23 DIAGNOSIS — Z87891 Personal history of nicotine dependence: Secondary | ICD-10-CM

## 2018-08-23 DIAGNOSIS — L0292 Furuncle, unspecified: Secondary | ICD-10-CM

## 2018-08-23 DIAGNOSIS — Z803 Family history of malignant neoplasm of breast: Secondary | ICD-10-CM

## 2018-08-23 LAB — CBC WITH DIFFERENTIAL/PLATELET
ABS IMMATURE GRANULOCYTES: 0.03 10*3/uL (ref 0.00–0.07)
BASOS ABS: 0 10*3/uL (ref 0.0–0.1)
Basophils Relative: 1 %
Eosinophils Absolute: 0.1 10*3/uL (ref 0.0–0.5)
Eosinophils Relative: 1 %
HEMATOCRIT: 29.4 % — AB (ref 36.0–46.0)
HEMOGLOBIN: 9.6 g/dL — AB (ref 12.0–15.0)
IMMATURE GRANULOCYTES: 0 %
LYMPHS ABS: 2.8 10*3/uL (ref 0.7–4.0)
LYMPHS PCT: 42 %
MCH: 30.2 pg (ref 26.0–34.0)
MCHC: 32.7 g/dL (ref 30.0–36.0)
MCV: 92.5 fL (ref 80.0–100.0)
MONOS PCT: 9 %
Monocytes Absolute: 0.6 10*3/uL (ref 0.1–1.0)
NEUTROS PCT: 47 %
NRBC: 0 % (ref 0.0–0.2)
Neutro Abs: 3.2 10*3/uL (ref 1.7–7.7)
Platelets: 226 10*3/uL (ref 150–400)
RBC: 3.18 MIL/uL — ABNORMAL LOW (ref 3.87–5.11)
RDW: 21.7 % — ABNORMAL HIGH (ref 11.5–15.5)
WBC: 6.7 10*3/uL (ref 4.0–10.5)

## 2018-08-23 LAB — COMPREHENSIVE METABOLIC PANEL
ALBUMIN: 3.2 g/dL — AB (ref 3.5–5.0)
ALK PHOS: 118 U/L (ref 38–126)
ALT: 11 U/L (ref 0–44)
AST: 14 U/L — AB (ref 15–41)
Anion gap: 9 (ref 5–15)
BUN: 16 mg/dL (ref 6–20)
CO2: 27 mmol/L (ref 22–32)
CREATININE: 1.37 mg/dL — AB (ref 0.44–1.00)
Calcium: 9.1 mg/dL (ref 8.9–10.3)
Chloride: 105 mmol/L (ref 98–111)
GFR calc Af Amer: 51 mL/min — ABNORMAL LOW (ref >60)
GFR, EST NON AFRICAN AMERICAN: 44 mL/min — AB (ref >60)
GLUCOSE: 137 mg/dL — AB (ref 70–99)
POTASSIUM: 3.2 mmol/L — AB (ref 3.5–5.1)
Sodium: 141 mmol/L (ref 135–145)
TOTAL PROTEIN: 8 g/dL (ref 6.5–8.1)

## 2018-08-23 MED ORDER — SODIUM CHLORIDE 0.9 % IV SOLN
Freq: Once | INTRAVENOUS | Status: AC
  Administered 2018-08-23: 11:00:00 via INTRAVENOUS
  Filled 2018-08-23: qty 250

## 2018-08-23 MED ORDER — PALONOSETRON HCL INJECTION 0.25 MG/5ML
0.2500 mg | Freq: Once | INTRAVENOUS | Status: AC
  Administered 2018-08-23: 0.25 mg via INTRAVENOUS

## 2018-08-23 MED ORDER — DIPHENHYDRAMINE HCL 25 MG PO CAPS
ORAL_CAPSULE | ORAL | Status: AC
  Filled 2018-08-23: qty 2

## 2018-08-23 MED ORDER — PALONOSETRON HCL INJECTION 0.25 MG/5ML
INTRAVENOUS | Status: AC
  Filled 2018-08-23: qty 5

## 2018-08-23 MED ORDER — TRASTUZUMAB CHEMO 150 MG IV SOLR
6.0000 mg/kg | Freq: Once | INTRAVENOUS | Status: AC
  Administered 2018-08-23: 672 mg via INTRAVENOUS
  Filled 2018-08-23: qty 32

## 2018-08-23 MED ORDER — SODIUM CHLORIDE 0.9 % IV SOLN
800.0000 mg/m2 | Freq: Once | INTRAVENOUS | Status: AC
  Administered 2018-08-23: 1824 mg via INTRAVENOUS
  Filled 2018-08-23: qty 47.97

## 2018-08-23 MED ORDER — HEPARIN SOD (PORK) LOCK FLUSH 100 UNIT/ML IV SOLN
500.0000 [IU] | Freq: Once | INTRAVENOUS | Status: AC | PRN
  Administered 2018-08-23: 500 [IU]
  Filled 2018-08-23: qty 5

## 2018-08-23 MED ORDER — SODIUM CHLORIDE 0.9 % IV SOLN
558.0000 mg | Freq: Once | INTRAVENOUS | Status: AC
  Administered 2018-08-23: 560 mg via INTRAVENOUS
  Filled 2018-08-23: qty 56

## 2018-08-23 MED ORDER — SODIUM CHLORIDE 0.9 % IV SOLN
Freq: Once | INTRAVENOUS | Status: AC
  Administered 2018-08-23: 11:00:00 via INTRAVENOUS
  Filled 2018-08-23: qty 5

## 2018-08-23 MED ORDER — DIPHENHYDRAMINE HCL 25 MG PO CAPS
25.0000 mg | ORAL_CAPSULE | Freq: Once | ORAL | Status: AC
  Administered 2018-08-23: 25 mg via ORAL

## 2018-08-23 MED ORDER — ACETAMINOPHEN 325 MG PO TABS
650.0000 mg | ORAL_TABLET | Freq: Once | ORAL | Status: AC
  Administered 2018-08-23: 650 mg via ORAL

## 2018-08-23 MED ORDER — ACETAMINOPHEN 325 MG PO TABS
ORAL_TABLET | ORAL | Status: AC
  Filled 2018-08-23: qty 2

## 2018-08-23 MED ORDER — SODIUM CHLORIDE 0.9% FLUSH
10.0000 mL | INTRAVENOUS | Status: DC | PRN
  Administered 2018-08-23: 10 mL
  Filled 2018-08-23: qty 10

## 2018-08-23 NOTE — Patient Instructions (Signed)
[facility] Discharge Instructions for Patients Receiving Chemotherapy  Today you received the following chemotherapy agents Gemcitabine (GEMZAR), Carboplatin (Paraplatin) & Trastuzumab (Herceptin).  To help prevent nausea and vomiting after your treatment, we encourage you to take your nausea medication as prescribed.  If you develop nausea and vomiting that is not controlled by your nausea medication, call the clinic.   BELOW ARE SYMPTOMS THAT SHOULD BE REPORTED IMMEDIATELY:  *FEVER GREATER THAN 100.5 F  *CHILLS WITH OR WITHOUT FEVER  NAUSEA AND VOMITING THAT IS NOT CONTROLLED WITH YOUR NAUSEA MEDICATION  *UNUSUAL SHORTNESS OF BREATH  *UNUSUAL BRUISING OR BLEEDING  TENDERNESS IN MOUTH AND THROAT WITH OR WITHOUT PRESENCE OF ULCERS  *URINARY PROBLEMS  *BOWEL PROBLEMS  UNUSUAL RASH Items with * indicate a potential emergency and should be followed up as soon as possible.  Feel free to call the clinic should you have any questions or concerns. The clinic phone number is [phone].  Please show the [facility] at check-in to the Emergency Department and triage nurse.

## 2018-08-23 NOTE — Telephone Encounter (Signed)
Per 11/7 los.  Put add-on in book for approval for 11/29 infusion; requested 12:00 noon.

## 2018-08-23 NOTE — Progress Notes (Signed)
[place]  [phone] [phone]) [phone]     ID: JASZMINE NAVEJAS DOB: 04/09/1966  MR#: [id number]  URK#:[id number]  Patient Care Team: [name], NP as PCP - General (Internal Medicine) Michel Bickers, MD as PCP - Infectious Diseases (Infectious Diseases) Magrinat, Virgie Dad, MD as Consulting Physician (Oncology) Kyung Rudd, MD as Consulting Physician (Radiation Oncology) Coralie Keens, MD as Consulting Physician (General Surgery) OTHER MD:   CHIEF COMPLAINT: Triple positive breast cancer  CURRENT TREATMENT: Neoadjuvant chemotherapy  INTERVAL HISTORY: [name] returns today for follow up and treatment of her triple positive breast cancer, accompanied by her daughter. She receives docetaxel, carboplatin, trastuzumab, and pertuzumab every 21 days, with today being day 1 cycle 5. She also receives [place] on day 3 of every cycle. Pertuzumab held after cycle 1 due to diarrhea.  Docetaxel replaced with Gemcitabine starting with cycle 5 due to peripheral neuropathy.    REVIEW OF SYSTEMS: [name] is feeling better today.  A couple of weeks ago, she was experiencing peripheral neuropathy, however today she says its much improved.  She also notes improvement with work related issues since we sent the letter requesting improved understanding and accommodations.  She is fatigued.  She says her appetite is good.  She denies any fevers, chills, nausea, vomiting. She continues to have issues with hydradenitis, and has continued on Doxycycline for this.  Otherwise a detailed ROS was conducted and non contributory.     HISTORY OF CURRENT ILLNESS: From the original intake note:  [name] (pronounced "Huff") palpated a mass in the right breast for about 2 weeks before bringing it to medical attention. She underwent bilateral diagnostic mammography with tomography and right breast ultrasonography at [facility] on 04/20/2018 showing: breast density  category B. There was a highly suspicious mass located at 3 o'clock in the upper inner quadrant measuring 2.9 x 2.5 x 1.6 cm and 4 cm from the nipple. Ultrasound of the right axilla showed 5 lymph nodes with abnormal cortical thickening. The left axilla showed 3 lymph nodes with abnormal cortical thickening.  Accordingly on 04/26/2018 she proceeded to biopsy of the right breast area in question. The pathology from this procedure showed ([id number]): Invasive ductal carcinoma, grade II with calcifications. One right axillary lymph node and one left axillary lymph node were negative for carcinoma. Prognostic indicators significant for: estrogen receptor, 100% positive and progesterone receptor, 80% positive, both with strong staining intensity. Proliferation marker Ki67 at 70%. HER2 amplified with ratios HER2/CEP17 signals 4.52 and average HER2 copies per cell 10.85  The patient's subsequent history is as detailed below.    PAST MEDICAL HISTORY: Past Medical History:  Diagnosis Date  . Anemia    Normocytic  . Anxiety   . Asthma   . Blood dyscrasia   . Bronchitis 2005  . 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 Diabetes mellitus without complication ([place])   . Family history of breast cancer   . Family history of lung cancer   . Family history of prostate cancer   . Gastroenteritis 07/10/07  . GERD 07/24/2006  . GRAVE'S DISEASE 01/01/2008  . History of hidradenitis suppurativa   . History of kidney stones   . History of thrush   . HIV DISEASE 07/24/2006   dx March 05  . HYPERTENSION 07/24/2006  . Hyperthyroidism 08/2006   Grave's Disease -diffuse radiotracer uptake 08/25/06 Thyroid scan-Cold nodule to R lower lobe of thyrorid  . Menometrorrhagia    hx of  . Nephrolithiasis   .  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 Pneumonia 2005  . 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 Sarcoidosis 02/08/2007   dx as a teenager in  [place] from abnl CXR. Completed 2 yrs Prednisone after lung bx confirmation. No symptoms since then.  . Thyroid cancer ([place])   . THYROID NODULE, RIGHT 02/08/2007  -2018 thyroid nodules were precancerous but pathology was negative for thyroid cancer.   PAST SURGICAL HISTORY: Past Surgical History:  Procedure Laterality Date  . BREAST SURGERY  1997   Breast Reduction   . CYSTOSCOPY W/ URETERAL STENT REMOVAL  11/09/2012   Procedure: CYSTOSCOPY WITH STENT REMOVAL;  Surgeon: Alexis Frock, MD;  Location: [facility];  Service: Urology;  Laterality: Right;  . CYSTOSCOPY WITH RETROGRADE PYELOGRAM, URETEROSCOPY AND STENT PLACEMENT  11/09/2012   Procedure: CYSTOSCOPY WITH RETROGRADE PYELOGRAM, URETEROSCOPY AND STENT PLACEMENT;  Surgeon: Alexis Frock, MD;  Location: [facility];  Service: Urology;  Laterality: Left;  LEFT URETEROSCOPY, STONE MANIPULATION, left STENT exchange   . CYSTOSCOPY WITH STENT PLACEMENT  10/02/2012   Procedure: CYSTOSCOPY WITH STENT PLACEMENT;  Surgeon: Alexis Frock, MD;  Location: [facility];  Service: Urology;  Laterality: Left;  . DILATION AND CURETTAGE OF UTERUS  Feb 2004   s/p for 1st trimester nonviable pregnancy  . EYE SURGERY     sty under eyelid  . INCISE AND DRAIN ABCESS  Nov 03   s/p I &D for righ inframmary fold hidradenitis  . INCISION AND DRAINAGE PERITONSILLAR ABSCESS  Mar 03  . IR CV LINE INJECTION  06/07/2018  . IR IMAGING GUIDED PORT INSERTION  06/20/2018  . IR REMOVAL TUN ACCESS W/ PORT W/O FL MOD SED  06/20/2018  . IRRIGATION AND DEBRIDEMENT ABSCESS  01/31/2012   Procedure: IRRIGATION AND DEBRIDEMENT ABSCESS;  Surgeon: Shann Medal, MD;  Location: [facility];  Service: General;  Laterality: Right;  right breast and axilla   . NEPHROLITHOTOMY  10/02/2012   Procedure: NEPHROLITHOTOMY PERCUTANEOUS;  Surgeon: Alexis Frock, MD;  Location: [facility];  Service: Urology;  Laterality: Right;  First Stage Percutaneous Nephrolithotomy with Surgeon Access, Left Ureteral Stent    .  NEPHROLITHOTOMY  10/04/2012   Procedure: NEPHROLITHOTOMY PERCUTANEOUS SECOND LOOK;  Surgeon: Alexis Frock, MD;  Location[facility];  Service: Urology;  Laterality: Right;     . NEPHROLITHOTOMY  10/08/2012   Procedure: NEPHROLITHOTOMY PERCUTANEOUS;  Surgeon: Alexis Frock, MD;  Location: [facility];  Service: Urology;  Laterality: Right;  THIRD STAGE, nephrostomy tube exchange x 2  . NEPHROLITHOTOMY  10/11/2012   Procedure: NEPHROLITHOTOMY PERCUTANEOUS SECOND LOOK;  Surgeon: Alexis Frock, MD;  Location: [facility];  Service: Urology;  Laterality: Right;  RIGHT 4 STAGE PERCUTANOUS NEPHROLITHOTOMY, right URETEROSCOPY WITH HOLMIUM LASER   . PORTACATH PLACEMENT Left 05/17/2018   Procedure: INSERTION PORT-A-CATH;  Surgeon: Coralie Keens, MD;  Location: [facility];  Service: General;  Laterality: Left;  . RADICAL NECK DISSECTION  04/12/2017   limited/notes 04/12/2017  . RADICAL NECK DISSECTION N/A 04/12/2017   Procedure: RADICAL NECK DISSECTION;  Surgeon: Melida Quitter, MD;  Location: [facility];  Service: ENT;  Laterality: N/A;  limited neck dissection 2 hours total  . REDUCTION MAMMAPLASTY Bilateral 1998  . [place]  1994  . THYROIDECTOMY  04/12/2017   completion/notes 04/12/2017  . THYROIDECTOMY N/A 04/12/2017   Procedure: THYROIDECTOMY;  Surgeon: Melida Quitter, MD;  Location: [facility];  Service: ENT;  Laterality: N/A;  Completion Thyroidectomy  . TOTAL THYROIDECTOMY  2010  Thyroid nodules removed- pathology at the ENT doctor 2018 were benign -Over active thyroid and thyroidectomy with benign pathology  FAMILY HISTORY Family History  Problem Relation Age of Onset  . Hypertension Mother   . Cancer Mother        laryngeal  . Heart disease Mother        stent  . Hypertension Father   . Lung cancer Father 17       hx smoking  . Heart disease Other   . Hypertension Other   . Stroke Other        Grandparent  . Kidney disease Other        Grandparent  . Diabetes Other        FH of  Diabetes  . Hypertension Sister   . Cancer Maternal Uncle        Lung CA  . Hypertension Brother   . Hypertension Sister   . Breast cancer Maternal Aunt 65  . Breast cancer Paternal Aunt 34  . Prostate cancer Paternal Uncle   . Breast cancer Maternal Aunt        dx 60+  . Breast cancer Paternal Aunt        dx 14's  . Breast cancer Paternal Aunt        dx 56's  . Prostate cancer Paternal Uncle   . Lung cancer Paternal Uncle   . Breast cancer Cousin 29  . Breast cancer Cousin        dx <50  . Breast cancer Cousin        dx <50  . Breast cancer Cousin        dx <50  The patient's mother is alive at age 81. The patient's father died at 44 from lung cancer (heavy smoker). She does not know much about her father. The patient has 1 brother and 2 sisters. The patient's mother had a history of laryngeal cancer. There was a maternal cousin with breast cancer diagnosed at age 70. There were 2 maternal aunts with breast cancer, the youngest being diagnosed in the 37's. There was a maternal great aunt with breast cancer. There were 2 paternal aunts with breast cancer.    GYNECOLOGIC HISTORY:  Patient's last menstrual period was 03/31/2014. Menarche: 52 years old Age at first live birth: 52 years old She is GX P1.  Her LMP was in 2017. She did not use HRT.    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 At home is the patient's daughter, Verdie Drown, age 64, who is a Engineer, technical sales education and working part-time at the [employer]. The patient attends [employer].     ADVANCED DIRECTIVES: Not in place; at the 05/10/2018 visit the patient was given the appropriate documents to complete and notarized at her discretion   HEALTH MAINTENANCE: Social History   Tobacco Use  . Smoking status: Former Smoker    Packs/day: 0.50    Years: 15.00    Pack years: 7.50    Types: Cigarettes    Start date: 04/12/2017  . Smokeless tobacco: Never Used    Substance Use Topics  . Alcohol use: Yes    Alcohol/week: 0.0 standard drinks    Comment: social  . Drug use: No     Colonoscopy: no  PAP: [facility]. 2018  Bone density: remote   Allergies  Allergen Reactions  . Tape Rash    Rash   . Genvoya [Elviteg-Cobic-Emtricit-Tenofaf] Hives  .  Lisinopril Cough  . Xylocaine [Lidocaine]     Unknown , patient states she has had lidocaine for IV starts and at the dentist with no problems  . Valsartan Cough    Pt states she tolerates medicine now    Current Outpatient Medications  Medication Sig Dispense Refill  . amLODipine (NORVASC) 10 MG tablet Take 1 tablet (10 mg total) by mouth daily. 90 tablet 0  . atorvastatin (LIPITOR) 20 MG tablet Take 1 tablet by mouth every evening for high cholesterol. NEED APPOINTMENT FOR ANY MORE REFILLS 30 tablet 0  . dexamethasone (DECADRON) 4 MG tablet Take 2 tablets (8 mg total) by mouth 2 (two) times daily. Start the day before Taxotere. Take once the day after, then 2 times a day x 2d. 30 tablet 1  . dolutegravir ([name]) 50 MG tablet Take 1 tablet (50 mg total) by mouth daily. 30 tablet 11  . doxycycline (VIBRA-TABS) 100 MG tablet Take 1 tablet (100 mg total) by mouth 2 (two) times daily. 60 tablet 5  . doxycycline (VIBRA-TABS) 100 MG tablet Take 1 tablet (100 mg total) by mouth daily. 90 tablet 2  . emtricitabine-tenofovir AF (DESCOVY) 200-25 MG tablet Take 1 tablet by mouth daily. 30 tablet 11  . hydrALAZINE (APRESOLINE) 25 MG tablet TAKE 1 TABLET BY MOUTH THREE TIMES A DAY 270 tablet 1  . irbesartan-hydrochlorothiazide (AVALIDE) 150-12.5 MG tablet TAKE 1 TABLET BY MOUTH ONCE DAILY FOR BLOOD PRESSURE. 90 tablet 1  . levothyroxine (SYNTHROID, LEVOTHROID) 175 MCG tablet Take 1 tablet by mouth every morning on an empty stomach with a full glass of water. No food or other medications for 30 minutes. 30 tablet 0  . lidocaine-prilocaine (EMLA) cream Apply to affected area once 30 g 3  . LORazepam  (ATIVAN) 0.5 MG tablet Take 1 tablet (0.5 mg total) by mouth at bedtime as needed (Nausea or vomiting). 20 tablet 0  . metFORMIN (GLUCOPHAGE) 500 MG tablet TAKE 1 TABLET BY MOUTH TWICE A DAY WITH A MEAL (DUE FOR APPT FOR ADDITIONAL REFILLS) 60 tablet 0  . metoprolol succinate (TOPROL-XL) 50 MG 24 hr tablet TAKE 1 TABLET (50 MG TOTAL) BY MOUTH DAILY. TAKE WITH OR IMMEDIATELY FOLLOWING A MEAL. 90 tablet 1  . mometasone (NASONEX) 50 MCG/ACT nasal spray PLACE 2 SPRAYS INTO THE NOSE DAILY. 17 g 3  . Multiple Vitamin (MULTIVITAMIN) tablet Take 1 tablet by mouth daily.    . ONE TOUCH LANCETS MISC Use as instructed to test blood sugar daily 200 each 5  . ONETOUCH VERIO test strip USE AS INSTRUCTED TO TEST BLOOD SUGAR DAILY NEED APPOINTMENT FOR ANY MORE REFILLS 50 each 0  . oxyCODONE (OXY IR/ROXICODONE) 5 MG immediate release tablet Take 1 tablet (5 mg total) by mouth every 6 (six) hours as needed for moderate pain or severe pain. 20 tablet 0  . pantoprazole (PROTONIX) 40 MG tablet Take 40 mg by mouth daily as needed (for indigestion/acid reflux.).     Marland Kitchen potassium chloride (K-DUR) 10 MEQ tablet Take 2 tablets (20 mEq total) by mouth 2 (two) times daily. 20 tablet 0  . PROAIR RESPICLICK 867 (90 Base) MCG/ACT AEPB INHALE 2 PUFFS INTO THE LUNGS EVERY 6 HOURS AS NEEDED FOR WHEEZING OR SHORTNESS OF BREATH. 1 each 2  . prochlorperazine (COMPAZINE) 10 MG tablet Take 1 tablet (10 mg total) by mouth every 6 (six) hours as needed (Nausea or vomiting). 30 tablet 1  . traMADol (ULTRAM) 50 MG tablet Take 1 tablet (50 mg  total) by mouth every 6 (six) hours as needed. 30 tablet 0   No current facility-administered medications for this visit.     OBJECTIVE:  Vitals:   08/23/18 0948  BP: (!) 163/88  Pulse: 73  Resp: 18  Temp: 98.3 F (36.8 C)  SpO2: 100%     Body mass index is 38.72 kg/m.   Wt Readings from Last 3 Encounters:  08/23/18 232 lb 11.2 oz (105.6 kg)  08/10/18 236 lb 9.6 oz (107.3 kg)  07/12/18 234  lb (106.1 kg)  ECOG FS:1 - Symptomatic but completely ambulatory GENERAL: Patient is a well appearing female in no acute distress HEENT:  Sclerae anicteric.  Oropharynx clear and moist. No ulcerations or evidence of oropharyngeal candidiasis. Neck is supple.  NODES:  No cervical, supraclavicular, or axillary lymphadenopathy palpated.  BREAST EXAM:  Unchanged, no sign of progression, area of skin issues with "boils" covered by gauze and tape, dressing is clean dry and intact LUNGS:  Clear to auscultation bilaterally.  No wheezes or rhonchi. HEART:  Regular rate and rhythm. No murmur appreciated. ABDOMEN:  Soft, nontender.  Positive, normoactive bowel sounds. No organomegaly palpated. MSK:  No focal spinal tenderness to palpation. Full range of motion bilaterally in the upper extremities. EXTREMITIES:  No peripheral edema.   SKIN:  Clear with no obvious rashes or skin changes. No nail dyscrasia. Dressing over boil in right lower abdominal fold NEURO:  Nonfocal. Well oriented.  Appropriate affect.       LAB RESULTS:  CMP     Component Value Date/Time   NA 143 08/10/2018 1206   K 3.4 (L) 08/10/2018 1206   CL 105 08/10/2018 1206   CO2 28 08/10/2018 1206   GLUCOSE 140 (H) 08/10/2018 1206   BUN 16 08/10/2018 1206   CREATININE 1.39 (H) 08/10/2018 1206   CREATININE 1.28 (H) 05/10/2018 1458   CREATININE 1.02 09/23/2015 1047   CALCIUM 9.4 08/10/2018 1206   PROT 7.7 08/10/2018 1206   ALBUMIN 3.2 (L) 08/10/2018 1206   AST 19 08/10/2018 1206   AST 28 05/10/2018 1458   ALT 16 08/10/2018 1206   ALT 23 05/10/2018 1458   ALKPHOS 162 (H) 08/10/2018 1206   BILITOT <0.2 (L) 08/10/2018 1206   BILITOT <0.2 (L) 05/10/2018 1458   GFRNONAA 43 (L) 08/10/2018 1206   GFRNONAA 48 (L) 05/10/2018 1458   GFRAA 50 (L) 08/10/2018 1206   GFRAA 56 (L) 05/10/2018 1458    No results found for: TOTALPROTELP, ALBUMINELP, A1GS, A2GS, BETS, BETA2SER, GAMS, MSPIKE, SPEI  No results found for: KPAFRELGTCHN,  LAMBDASER, KAPLAMBRATIO  Lab Results  Component Value Date   WBC 6.7 08/23/2018   NEUTROABS 3.2 08/23/2018   HGB 9.6 (L) 08/23/2018   HCT 29.4 (L) 08/23/2018   MCV 92.5 08/23/2018   PLT 226 08/23/2018    '@LASTCHEMISTRY'$ @  No results found for: LABCA2  No components found for: [id number]  No results for input(s): INR in the last 168 hours.  No results found for: LABCA2  No results found for: PYK998  No results found for: PJA250  No results found for: NLZ767  No results found for: CA2729  No components found for: HGQUANT  No results found for: CEA1 / No results found for: CEA1   No results found for: AFPTUMOR  No results found for: CHROMOGRNA  No results found for: PSA1  Appointment on 08/23/2018  Component Date Value Ref Range Status  . WBC 08/23/2018 6.7  4.0 - 10.5 K/uL Final  .  RBC 08/23/2018 3.18* 3.87 - 5.11 MIL/uL Final  . Hemoglobin 08/23/2018 9.6* 12.0 - 15.0 g/dL Final  . HCT 08/23/2018 29.4* 36.0 - 46.0 % Final  . MCV 08/23/2018 92.5  80.0 - 100.0 fL Final  . MCH 08/23/2018 30.2  26.0 - 34.0 pg Final  . MCHC 08/23/2018 32.7  30.0 - 36.0 g/dL Final  . RDW 08/23/2018 21.7* 11.5 - 15.5 % Final  . Platelets 08/23/2018 226  150 - 400 K/uL Final  . nRBC 08/23/2018 0.0  0.0 - 0.2 % Final  . Neutrophils Relative % 08/23/2018 47  % Final  . Neutro Abs 08/23/2018 3.2  1.7 - 7.7 K/uL Final  . Lymphocytes Relative 08/23/2018 42  % Final  . Lymphs Abs 08/23/2018 2.8  0.7 - 4.0 K/uL Final  . Monocytes Relative 08/23/2018 9  % Final  . Monocytes Absolute 08/23/2018 0.6  0.1 - 1.0 K/uL Final  . Eosinophils Relative 08/23/2018 1  % Final  . Eosinophils Absolute 08/23/2018 0.1  0.0 - 0.5 K/uL Final  . Basophils Relative 08/23/2018 1  % Final  . Basophils Absolute 08/23/2018 0.0  0.0 - 0.1 K/uL Final  . Immature Granulocytes 08/23/2018 0  % Final  . Abs Immature Granulocytes 08/23/2018 0.03  0.00 - 0.07 K/uL Final   Performed at [facility]  Laboratory, [address] [place], [place] [address]    (this displays the last labs from the last 3 days)  No results found for: TOTALPROTELP, ALBUMINELP, A1GS, A2GS, BETS, BETA2SER, GAMS, MSPIKE, SPEI (this displays SPEP labs)  No results found for: KPAFRELGTCHN, LAMBDASER, KAPLAMBRATIO (kappa/lambda light chains)  No results found for: HGBA, HGBA2QUANT, HGBFQUANT, HGBSQUAN (Hemoglobinopathy evaluation)   No results found for: LDH  Lab Results  Component Value Date   IRON 26 (L) 02/08/2007   TIBC 371 02/08/2007   IRONPCTSAT 7 (L) 02/08/2007   (Iron and TIBC)  No results found for: FERRITIN  Urinalysis    Component Value Date/Time   COLORURINE YELLOW 10/21/2014 2347   APPEARANCEUR CLOUDY (A) 10/21/2014 2347   LABSPEC 1.014 10/21/2014 2347   PHURINE 6.0 10/21/2014 2347   GLUCOSEU NEGATIVE 10/21/2014 2347   GLUCOSEU NEG mg/dL 01/23/2008 1418   HGBUR LARGE (A) 10/21/2014 2347   BILIRUBINUR negative 12/29/2017 [place] 10/21/2014 2347   PROTEINUR 2+ 12/29/2017 1046   PROTEINUR 100 (A) 10/21/2014 2347   UROBILINOGEN 0.2 12/29/2017 1046   UROBILINOGEN 0.2 10/21/2014 2347   NITRITE negative 12/29/2017 1046   NITRITE NEGATIVE 10/21/2014 2347   LEUKOCYTESUR Large (3+) (A) 12/29/2017 1046     STUDIES:   ELIGIBLE FOR AVAILABLE RESEARCH PROTOCOL: UPBEAT  ASSESSMENT: 51 y.o. [place], [place] woman status post right breast upper inner quadrant biopsy 04/26/2018 for a clinical T2 N0, stage Ib invasive ductal carcinoma, grade 2, with an MIB-1 of 70%.  (1) genetics testing 07/03/2018  (2) neoadjuvant chemotherapy will consist of carboplatin, docetaxel, trastuzumab and Pertuzumab given every 21 days x 6, starting 05/31/2018  (a) Pertuzumab discontinued starting with cycle 2 because of diarrhea problems  (3) trastuzumab to continue to total 6 months  (a) echo on 05/16/2018 shows an EF of 60-65%.  (4) definitive surgery to follow chemotherapy  (5) adjuvant  radiation to follow surgery  (6) antiestrogens to start at the completion of local treatment  (7) hidradenitis/boils: on doxycycline daily  PLAN: Rakayla is doing well today.  Her labs are stable.  Her peripheral neuropathy is present, but much improved.  She  will proceed with treatment today, however will proceed with Gemcitabine/Carboplatin and Trastuzumab.  She has an echocardiogram scheduled next week.  We reviewed this in detail.  She understands possible adverse effects of Gemcitabine.    I am pleased to hear that her work issues are improved with the letter I sent.    Cailah will return in 1 week for labs and f/u.  I went ahead and ordered her f/u MRI after her sixth cycle of chemotherapy which is due on 11/29 and her f/u lab, and appointment to review those results.  She knows to call for any questions or concerns between now and her next appointment.  A total of (30) minutes of face-to-face time was spent with this patient with greater than 50% of that time in counseling and care-coordination.   Wilber Bihari, NP 08/23/18

## 2018-08-24 ENCOUNTER — Other Ambulatory Visit: Payer: Self-pay | Admitting: Oncology

## 2018-08-24 ENCOUNTER — Telehealth: Payer: Self-pay | Admitting: Primary Care

## 2018-08-24 ENCOUNTER — Telehealth: Payer: Self-pay | Admitting: Adult Health

## 2018-08-24 ENCOUNTER — Ambulatory Visit: Payer: Managed Care, Other (non HMO) | Admitting: Primary Care

## 2018-08-24 VITALS — BP 142/86 | HR 75 | Temp 98.5°F | Ht 65.0 in | Wt 235.5 lb

## 2018-08-24 DIAGNOSIS — I10 Essential (primary) hypertension: Secondary | ICD-10-CM | POA: Diagnosis not present

## 2018-08-24 DIAGNOSIS — C50311 Malignant neoplasm of lower-inner quadrant of right female breast: Secondary | ICD-10-CM

## 2018-08-24 DIAGNOSIS — E119 Type 2 diabetes mellitus without complications: Secondary | ICD-10-CM

## 2018-08-24 DIAGNOSIS — E890 Postprocedural hypothyroidism: Secondary | ICD-10-CM | POA: Diagnosis not present

## 2018-08-24 DIAGNOSIS — Z170 Estrogen receptor positive status [ER+]: Principal | ICD-10-CM

## 2018-08-24 LAB — HEMOGLOBIN A1C: HEMOGLOBIN A1C: 6.2 % (ref 4.6–6.5)

## 2018-08-24 LAB — TSH: TSH: 2.69 u[IU]/mL (ref 0.35–4.50)

## 2018-08-24 MED ORDER — AMLODIPINE BESYLATE 10 MG PO TABS: 10.0000 mg | ORAL_TABLET | Freq: Every day | ORAL | 3 refills | Status: DC

## 2018-08-24 NOTE — Telephone Encounter (Signed)
Pt called back wanting to know if she can be worked in before end of year for cpx.   Best number [phone]

## 2018-08-24 NOTE — Assessment & Plan Note (Signed)
Compliant to metformin 500 mg BID.  Managed on statin and ARB. Foot exam today. She will schedule eye exam. Repeat A1C pending, continue current regimen for now.

## 2018-08-24 NOTE — Patient Instructions (Addendum)
Stop by the lab prior to leaving today. I will notify you of your results once received.   Please bring your medication bottles to the office around 1 pm next week.   Make sure to take your levothyroxine 175 mcg tablets every morning with water only, no food or other medications for 30 minutes. The pantoprazole medication has to be taken at least 4 hours after the levothyroxine.   Please schedule a follow up appointment in 6 months.   It was a pleasure to see you today!

## 2018-08-24 NOTE — Assessment & Plan Note (Signed)
Taking levothyroxine 175 mcg inappropriately.   Discussed to take alone with water only, no food or other medications for 30 minutes. Dicussed to take pantoprazole 4 hours later.   Repeat TSH pending.

## 2018-08-24 NOTE — Progress Notes (Signed)
Subjective:    Patient ID: [name], female    DOB: 1966/06/10, 52 y.o.   MRN: [id number]  HPI  [name] is a 52 year old female who presents today for follow up.  1) Type 2 Diabetes:   Current medications include: Metformin 500 mg BID  She is checking her blood glucose twice daily and is getting readings of: AM fasting: 90-124 Hours after lunch: 90-124  Last A1C: 6.7 in June 2018 Last Eye Exam: No eye exam this year. Last Foot Exam:  Pneumonia Vaccination: ACE/ARB: ARB Statin: atorvastatin   2) Hypothyroidism: Postsurgical from thyroid cancer in 2012. Thyroidectomy in 2010 and again in 2018. Currently managed on levothyroxine 175 mcg. TSH of 7.97 in May 2019. She is taking her levothyroxine with water and is eating breakfast. She also takes her other medications 10 minutes later. It was recommended she return in June 2019 for repeat TSH but never followed through.   3) Essential Hypertension: Currently prescribed amlodipine 10 mg, hydralazine 25 mg, irbesartan-HCTZ 150-12.5 mg, metoprolol succinate 50 mg. She's not sure what BP medications she's taking. Her last refill of Amlodipine was in February 2019 for a 3 month supply. She does not have her medication bottles with her today.  She is undergoing chemotherapy for breast cancer.   BP Readings from Last 3 Encounters:  08/24/18 (!) 142/86  08/23/18 (!) 163/88  08/10/18 (!) 147/74      Review of Systems  Constitutional: Positive for fatigue.  Respiratory: Negative for shortness of breath.   Cardiovascular: Negative for chest pain.  Neurological: Negative for dizziness and headaches.       Past Medical History:  Diagnosis Date  . Anemia    Normocytic  . Anxiety   . Asthma   . Blood dyscrasia   . Bronchitis 2005  . 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 Diabetes mellitus without complication ([place])   . Family history of breast cancer   . Family history of lung cancer   . Family history of prostate  cancer   . Gastroenteritis 07/10/07  . GERD 07/24/2006  . GRAVE'S DISEASE 01/01/2008  . History of hidradenitis suppurativa   . History of kidney stones   . History of thrush   . HIV DISEASE 07/24/2006   dx March 05  . HYPERTENSION 07/24/2006  . Hyperthyroidism 08/2006   Grave's Disease -diffuse radiotracer uptake 08/25/06 Thyroid scan-Cold nodule to R lower lobe of thyrorid  . Menometrorrhagia    hx of  . Nephrolithiasis   . 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 Pneumonia 2005  . 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 Sarcoidosis 02/08/2007   dx as a teenager in [place] from abnl CXR. Completed 2 yrs Prednisone after lung bx confirmation. No symptoms since then.  . Thyroid cancer ([place])   . THYROID NODULE, RIGHT 02/08/2007     Social History   Socioeconomic History  . Marital status: Single    Spouse name: Not on file  . Number of children: 1  . Years of education: Not on file  . Highest education level: Not on file  Occupational History  . Occupation: Secondary school teacher  Social Needs  . Financial resource strain: Not on file  . Food insecurity:    Worry: Not on file    Inability: Not on file  . Transportation needs:    Medical: Not on file    Non-medical: Not on file  Tobacco Use  . Smoking  status: Former Smoker    Packs/day: 0.50    Years: 15.00    Pack years: 7.50    Types: Cigarettes    Start date: 04/12/2017  . Smokeless tobacco: Never Used  Substance and Sexual Activity  . Alcohol use: Yes    Alcohol/week: 0.0 standard drinks    Comment: social  . Drug use: No  . Sexual activity: Not Currently    Birth control/protection: Post-menopausal    Comment: declined condoms  Lifestyle  . Physical activity:    Days per week: Not on file    Minutes per session: Not on file  . Stress: Not on file  Relationships  . Social connections:    Talks on phone: Not on file    Gets together: Not on file     Attends religious service: Not on file    Active member of club or organization: Not on file    Attends meetings of clubs or organizations: Not on file    Relationship status: Not on file  . Intimate partner violence:    Fear of current or ex partner: Not on file    Emotionally abused: Not on file    Physically abused: Not on file    Forced sexual activity: Not on file  Other Topics Concern  . Not on file  Social History Narrative  . Not on file    Past Surgical History:  Procedure Laterality Date  . BREAST SURGERY  1997   Breast Reduction   . CYSTOSCOPY W/ URETERAL STENT REMOVAL  11/09/2012   Procedure: CYSTOSCOPY WITH STENT REMOVAL;  Surgeon: Alexis Frock, MD;  Location: [facility];  Service: Urology;  Laterality: Right;  . CYSTOSCOPY WITH RETROGRADE PYELOGRAM, URETEROSCOPY AND STENT PLACEMENT  11/09/2012   Procedure: CYSTOSCOPY WITH RETROGRADE PYELOGRAM, URETEROSCOPY AND STENT PLACEMENT;  Surgeon: Alexis Frock, MD;  Location: [facility];  Service: Urology;  Laterality: Left;  LEFT URETEROSCOPY, STONE MANIPULATION, left STENT exchange   . CYSTOSCOPY WITH STENT PLACEMENT  10/02/2012   Procedure: CYSTOSCOPY WITH STENT PLACEMENT;  Surgeon: Alexis Frock, MD;  Location: [facility];  Service: Urology;  Laterality: Left;  . DILATION AND CURETTAGE OF UTERUS  Feb 2004   s/p for 1st trimester nonviable pregnancy  . EYE SURGERY     sty under eyelid  . INCISE AND DRAIN ABCESS  Nov 03   s/p I &D for righ inframmary fold hidradenitis  . INCISION AND DRAINAGE PERITONSILLAR ABSCESS  Mar 03  . IR CV LINE INJECTION  06/07/2018  . IR IMAGING GUIDED PORT INSERTION  06/20/2018  . IR REMOVAL TUN ACCESS W/ PORT W/O FL MOD SED  06/20/2018  . IRRIGATION AND DEBRIDEMENT ABSCESS  01/31/2012   Procedure: IRRIGATION AND DEBRIDEMENT ABSCESS;  Surgeon: Shann Medal, MD;  Location: [facility];  Service: General;  Laterality: Right;  right breast and axilla   . NEPHROLITHOTOMY  10/02/2012   Procedure: NEPHROLITHOTOMY  PERCUTANEOUS;  Surgeon: Alexis Frock, MD;  Location: [facility];  Service: Urology;  Laterality: Right;  First Stage Percutaneous Nephrolithotomy with Surgeon Access, Left Ureteral Stent    . NEPHROLITHOTOMY  10/04/2012   Procedure: NEPHROLITHOTOMY PERCUTANEOUS SECOND LOOK;  Surgeon: Alexis Frock, MD;  Location: [facility];  Service: Urology;  Laterality: Right;     . NEPHROLITHOTOMY  10/08/2012   Procedure: NEPHROLITHOTOMY PERCUTANEOUS;  Surgeon: Alexis Frock, MD;  Location: [facility];  Service: Urology;  Laterality: Right;  THIRD STAGE, nephrostomy tube exchange x 2  . NEPHROLITHOTOMY  10/11/2012  Procedure: NEPHROLITHOTOMY PERCUTANEOUS SECOND LOOK;  Surgeon: Alexis Frock, MD;  Location: [facility];  Service: Urology;  Laterality: Right;  RIGHT 4 STAGE PERCUTANOUS NEPHROLITHOTOMY, right URETEROSCOPY WITH HOLMIUM LASER   . PORTACATH PLACEMENT Left 05/17/2018   Procedure: INSERTION PORT-A-CATH;  Surgeon: Coralie Keens, MD;  Location: [facility];  Service: General;  Laterality: Left;  . RADICAL NECK DISSECTION  04/12/2017   limited/notes 04/12/2017  . RADICAL NECK DISSECTION N/A 04/12/2017   Procedure: RADICAL NECK DISSECTION;  Surgeon: Melida Quitter, MD;  Location: [facility];  Service: ENT;  Laterality: N/A;  limited neck dissection 2 hours total  . REDUCTION MAMMAPLASTY Bilateral 1998  . [place]  1994  . THYROIDECTOMY  04/12/2017   completion/notes 04/12/2017  . THYROIDECTOMY N/A 04/12/2017   Procedure: THYROIDECTOMY;  Surgeon: Melida Quitter, MD;  Location: [facility];  Service: ENT;  Laterality: N/A;  Completion Thyroidectomy  . TOTAL THYROIDECTOMY  2010    Family History  Problem Relation Age of Onset  . Hypertension Mother   . Cancer Mother        laryngeal  . Heart disease Mother        stent  . Hypertension Father   . Lung cancer Father 29       hx smoking  . Heart disease Other   . Hypertension Other   . Stroke Other        Grandparent  . Kidney disease Other         Grandparent  . Diabetes Other        FH of Diabetes  . Hypertension Sister   . Cancer Maternal Uncle        Lung CA  . Hypertension Brother   . Hypertension Sister   . Breast cancer Maternal Aunt 65  . Breast cancer Paternal Aunt 33  . Prostate cancer Paternal Uncle   . Breast cancer Maternal Aunt        dx 60+  . Breast cancer Paternal Aunt        dx 61's  . Breast cancer Paternal Aunt        dx 62's  . Prostate cancer Paternal Uncle   . Lung cancer Paternal Uncle   . Breast cancer Cousin 65  . Breast cancer Cousin        dx <50  . Breast cancer Cousin        dx <50  . Breast cancer Cousin        dx <50    Allergies  Allergen Reactions  . Tape Rash    Rash   . Genvoya [Elviteg-Cobic-Emtricit-Tenofaf] Hives  . Lisinopril Cough  . Xylocaine [Lidocaine]     Unknown , patient states she has had lidocaine for IV starts and at the dentist with no problems  . Valsartan Cough    Pt states she tolerates medicine now    Current Outpatient Medications on File Prior to Visit  Medication Sig Dispense Refill  . amLODipine (NORVASC) 10 MG tablet Take 1 tablet (10 mg total) by mouth daily. 90 tablet 0  . atorvastatin (LIPITOR) 20 MG tablet Take 1 tablet by mouth every evening for high cholesterol. NEED APPOINTMENT FOR ANY MORE REFILLS 30 tablet 0  . dexamethasone (DECADRON) 4 MG tablet Take 2 tablets (8 mg total) by mouth 2 (two) times daily. Start the day before Taxotere. Take once the day after, then 2 times a day x 2d. 30 tablet 1  . dolutegravir ([name]) 50 MG tablet Take 1  tablet (50 mg total) by mouth daily. 30 tablet 11  . doxycycline (VIBRA-TABS) 100 MG tablet Take 1 tablet (100 mg total) by mouth 2 (two) times daily. 60 tablet 5  . doxycycline (VIBRA-TABS) 100 MG tablet Take 1 tablet (100 mg total) by mouth daily. 90 tablet 2  . emtricitabine-tenofovir AF (DESCOVY) 200-25 MG tablet Take 1 tablet by mouth daily. 30 tablet 11  . hydrALAZINE (APRESOLINE) 25 MG tablet TAKE 1  TABLET BY MOUTH THREE TIMES A DAY 270 tablet 1  . irbesartan-hydrochlorothiazide (AVALIDE) 150-12.5 MG tablet TAKE 1 TABLET BY MOUTH ONCE DAILY FOR BLOOD PRESSURE. 90 tablet 1  . levothyroxine (SYNTHROID, LEVOTHROID) 175 MCG tablet Take 1 tablet by mouth every morning on an empty stomach with a full glass of water. No food or other medications for 30 minutes. 30 tablet 0  . lidocaine-prilocaine (EMLA) cream Apply to affected area once 30 g 3  . LORazepam (ATIVAN) 0.5 MG tablet Take 1 tablet (0.5 mg total) by mouth at bedtime as needed (Nausea or vomiting). 20 tablet 0  . metFORMIN (GLUCOPHAGE) 500 MG tablet TAKE 1 TABLET BY MOUTH TWICE A DAY WITH A MEAL (DUE FOR APPT FOR ADDITIONAL REFILLS) 60 tablet 0  . metoprolol succinate (TOPROL-XL) 50 MG 24 hr tablet TAKE 1 TABLET (50 MG TOTAL) BY MOUTH DAILY. TAKE WITH OR IMMEDIATELY FOLLOWING A MEAL. 90 tablet 1  . mometasone (NASONEX) 50 MCG/ACT nasal spray PLACE 2 SPRAYS INTO THE NOSE DAILY. 17 g 3  . Multiple Vitamin (MULTIVITAMIN) tablet Take 1 tablet by mouth daily.    . ONE TOUCH LANCETS MISC Use as instructed to test blood sugar daily 200 each 5  . ONETOUCH VERIO test strip USE AS INSTRUCTED TO TEST BLOOD SUGAR DAILY NEED APPOINTMENT FOR ANY MORE REFILLS 50 each 0  . oxyCODONE (OXY IR/ROXICODONE) 5 MG immediate release tablet Take 1 tablet (5 mg total) by mouth every 6 (six) hours as needed for moderate pain or severe pain. 20 tablet 0  . pantoprazole (PROTONIX) 40 MG tablet Take 40 mg by mouth daily as needed (for indigestion/acid reflux.).     Marland Kitchen potassium chloride (K-DUR) 10 MEQ tablet Take 2 tablets (20 mEq total) by mouth 2 (two) times daily. 20 tablet 0  . PROAIR RESPICLICK 650 (90 Base) MCG/ACT AEPB INHALE 2 PUFFS INTO THE LUNGS EVERY 6 HOURS AS NEEDED FOR WHEEZING OR SHORTNESS OF BREATH. 1 each 2  . traMADol (ULTRAM) 50 MG tablet Take 1 tablet (50 mg total) by mouth every 6 (six) hours as needed. 30 tablet 0   No current facility-administered  medications on file prior to visit.     BP (!) 142/86   Pulse 75   Temp 98.5 F (36.9 C) (Oral)   Ht 5\' 5"  (1.651 m)   Wt 235 lb 8 oz (106.8 kg)   LMP 03/31/2014 Comment: Negative Serum HCG 06/07/17  SpO2 98%   BMI 39.19 kg/m    Objective:   Physical Exam  Constitutional: She appears well-nourished.  Neck: Neck supple.  Cardiovascular: Normal rate and regular rhythm.  Respiratory: Effort normal and breath sounds normal.  Skin: Skin is warm and dry.  Psychiatric: She has a normal mood and affect.           Assessment & Plan:

## 2018-08-24 NOTE — Telephone Encounter (Signed)
Scheduled appt per 11/8 sch message - pt is aware of apt date and time

## 2018-08-24 NOTE — Telephone Encounter (Signed)
Please schedule patient at her convenience  Thank you!

## 2018-08-24 NOTE — Assessment & Plan Note (Signed)
BP uncontrolled, she doesn't know what she's taking and didn't bring medication bottles.   After speaking with her pharmacy, she is actively filling irbesartan-HCTZ and metoprolol succinate only. Continue both.  Will add in Amlodipine 10 mg daily, new prescription sent to pharmacy. Discontinue hydralazine.   She will monitor BP readings and report elevated readings as discussed.

## 2018-08-25 ENCOUNTER — Inpatient Hospital Stay: Payer: PRIVATE HEALTH INSURANCE

## 2018-08-25 VITALS — BP 167/93 | HR 69 | Temp 97.8°F | Resp 16

## 2018-08-25 DIAGNOSIS — C50311 Malignant neoplasm of lower-inner quadrant of right female breast: Secondary | ICD-10-CM

## 2018-08-25 DIAGNOSIS — Z170 Estrogen receptor positive status [ER+]: Principal | ICD-10-CM

## 2018-08-25 DIAGNOSIS — Z5111 Encounter for antineoplastic chemotherapy: Secondary | ICD-10-CM | POA: Diagnosis not present

## 2018-08-25 MED ORDER — PEGFILGRASTIM-CBQV 6 MG/0.6ML ~~LOC~~ SOSY
PREFILLED_SYRINGE | SUBCUTANEOUS | Status: AC
  Filled 2018-08-25: qty 0.6

## 2018-08-25 MED ORDER — PEGFILGRASTIM-CBQV 6 MG/0.6ML ~~LOC~~ SOSY
6.0000 mg | PREFILLED_SYRINGE | Freq: Once | SUBCUTANEOUS | Status: AC
  Administered 2018-08-25: 6 mg via SUBCUTANEOUS

## 2018-08-27 ENCOUNTER — Ambulatory Visit (HOSPITAL_COMMUNITY)
Admission: RE | Admit: 2018-08-27 | Discharge: 2018-08-27 | Disposition: A | Discharge status: Home / Self Care | Payer: Managed Care, Other (non HMO) | Source: Ambulatory Visit | Attending: Adult Health | Admitting: Adult Health

## 2018-08-27 DIAGNOSIS — E119 Type 2 diabetes mellitus without complications: Secondary | ICD-10-CM | POA: Insufficient documentation

## 2018-08-27 DIAGNOSIS — Z87891 Personal history of nicotine dependence: Secondary | ICD-10-CM | POA: Insufficient documentation

## 2018-08-27 DIAGNOSIS — I119 Hypertensive heart disease without heart failure: Secondary | ICD-10-CM | POA: Insufficient documentation

## 2018-08-27 DIAGNOSIS — C50311 Malignant neoplasm of lower-inner quadrant of right female breast: Secondary | ICD-10-CM | POA: Diagnosis present

## 2018-08-27 DIAGNOSIS — I340 Nonrheumatic mitral (valve) insufficiency: Secondary | ICD-10-CM | POA: Insufficient documentation

## 2018-08-27 DIAGNOSIS — Z170 Estrogen receptor positive status [ER+]: Secondary | ICD-10-CM | POA: Insufficient documentation

## 2018-08-27 NOTE — Progress Notes (Signed)
  Echocardiogram 2D Echocardiogram has been performed.  Jennette Dubin 08/27/2018, 1:48 PM

## 2018-08-28 ENCOUNTER — Telehealth: Payer: Self-pay

## 2018-08-28 NOTE — Telephone Encounter (Signed)
-----   Message from Gardenia Phlegm, NP sent at 08/27/2018  6:34 PM EST ----- Please call patient and let her know that her echo was normal. ----- Message ----- From: Interface, [employer] Sent: 08/27/2018   4:28 PM EST To: Gardenia Phlegm, NP

## 2018-08-28 NOTE — Telephone Encounter (Signed)
Called patient to inform of normal echo results.  Patient voiced understanding and thanks for call.  No questions/concerns at this time.

## 2018-08-29 NOTE — Telephone Encounter (Signed)
Appointment 11/25 Pt aware

## 2018-08-31 ENCOUNTER — Inpatient Hospital Stay: Payer: PRIVATE HEALTH INSURANCE

## 2018-08-31 ENCOUNTER — Telehealth: Payer: Self-pay

## 2018-08-31 ENCOUNTER — Other Ambulatory Visit: Payer: Self-pay | Admitting: Primary Care

## 2018-08-31 ENCOUNTER — Inpatient Hospital Stay: Payer: PRIVATE HEALTH INSURANCE | Admitting: Adult Health

## 2018-08-31 DIAGNOSIS — E119 Type 2 diabetes mellitus without complications: Secondary | ICD-10-CM

## 2018-08-31 NOTE — Telephone Encounter (Signed)
LVM for patient for call back in regards to missed appt at 9:30 with NP.

## 2018-08-31 NOTE — Telephone Encounter (Signed)
Noted, refill sent to pharmacy. 

## 2018-09-10 ENCOUNTER — Telehealth: Payer: Self-pay | Admitting: Adult Health

## 2018-09-10 ENCOUNTER — Encounter: Payer: Self-pay | Admitting: Primary Care

## 2018-09-10 ENCOUNTER — Ambulatory Visit (INDEPENDENT_AMBULATORY_CARE_PROVIDER_SITE_OTHER): Payer: Managed Care, Other (non HMO) | Admitting: Primary Care

## 2018-09-10 VITALS — BP 122/82 | HR 74 | Temp 98.0°F | Ht 65.0 in | Wt 233.0 lb

## 2018-09-10 DIAGNOSIS — B379 Candidiasis, unspecified: Secondary | ICD-10-CM | POA: Diagnosis not present

## 2018-09-10 DIAGNOSIS — Z0000 Encounter for general adult medical examination without abnormal findings: Secondary | ICD-10-CM

## 2018-09-10 DIAGNOSIS — J4520 Mild intermittent asthma, uncomplicated: Secondary | ICD-10-CM

## 2018-09-10 DIAGNOSIS — F419 Anxiety disorder, unspecified: Secondary | ICD-10-CM

## 2018-09-10 DIAGNOSIS — D869 Sarcoidosis, unspecified: Secondary | ICD-10-CM

## 2018-09-10 DIAGNOSIS — C50311 Malignant neoplasm of lower-inner quadrant of right female breast: Secondary | ICD-10-CM

## 2018-09-10 DIAGNOSIS — E785 Hyperlipidemia, unspecified: Secondary | ICD-10-CM

## 2018-09-10 DIAGNOSIS — Z1211 Encounter for screening for malignant neoplasm of colon: Secondary | ICD-10-CM

## 2018-09-10 DIAGNOSIS — B20 Human immunodeficiency virus [HIV] disease: Secondary | ICD-10-CM

## 2018-09-10 DIAGNOSIS — Z170 Estrogen receptor positive status [ER+]: Secondary | ICD-10-CM

## 2018-09-10 DIAGNOSIS — T3695XA Adverse effect of unspecified systemic antibiotic, initial encounter: Secondary | ICD-10-CM

## 2018-09-10 DIAGNOSIS — E119 Type 2 diabetes mellitus without complications: Secondary | ICD-10-CM

## 2018-09-10 DIAGNOSIS — I10 Essential (primary) hypertension: Secondary | ICD-10-CM

## 2018-09-10 DIAGNOSIS — E890 Postprocedural hypothyroidism: Secondary | ICD-10-CM

## 2018-09-10 DIAGNOSIS — L732 Hidradenitis suppurativa: Secondary | ICD-10-CM

## 2018-09-10 DIAGNOSIS — K219 Gastro-esophageal reflux disease without esophagitis: Secondary | ICD-10-CM

## 2018-09-10 MED ORDER — FLUCONAZOLE 150 MG PO TABS: 150.0000 mg | ORAL_TABLET | Freq: Once | ORAL | 0 refills | Status: AC

## 2018-09-10 NOTE — Progress Notes (Signed)
Subjective:    Patient ID: [name], female    DOB: 1965/12/31, 52 y.o.   MRN: [id number]  HPI  Ms. Kruczek is a 52 year old female who presents today for complete physical.  Currently managed on Doxycycline since August 2019 per her dermatology to keep her hidradenitis and boils reduced while she's on chemotherapy. She experiencing vaginal itching that began two days ago.   Immunizations: -Tetanus: Completed in 2011 -Influenza: Completed this season  -Pneumonia: Completed in 2019   Diet: She endorses a healthy diet. Breakfast: Oatmeal  Lunch: Skips, proteins, vegetables, starch Dinner: Skips, protein, vegetables, starch Snacks: Chips, crackers Desserts: None Beverages: Water, ginger ale  Exercise: She is not exercising  Eye exam: Completed in 2018 Dental exam: Completes semi-annually  Colonoscopy: Due, she will ask oncologist.  Pap Smear: Follows with GYN.  Mammogram: Scheduled for 09/14/18  BP Readings from Last 3 Encounters:  09/10/18 122/82  08/25/18 (!) 167/93  08/24/18 (!) 142/86     Review of Systems  Constitutional: Positive for fatigue. Negative for unexpected weight change.  HENT: Negative for rhinorrhea.   Respiratory: Negative for cough and shortness of breath.   Cardiovascular: Negative for chest pain.  Gastrointestinal: Negative for constipation and diarrhea.  Genitourinary: Negative for difficulty urinating.       Vaginal itching  Musculoskeletal: Negative for arthralgias.  Skin:       Recurrent boils  Allergic/Immunologic: Negative for environmental allergies.  Neurological: Negative for dizziness, numbness and headaches.  Psychiatric/Behavioral:       Intermittent anxiety       Past Medical History:  Diagnosis Date  . Anemia    Normocytic  . Anxiety   . Asthma   . Blood dyscrasia   . Bronchitis 2005  . 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 Diabetes mellitus without complication ([place])   . Family history of breast cancer     . Family history of lung cancer   . Family history of prostate cancer   . Gastroenteritis 07/10/07  . GERD 07/24/2006  . GRAVE'S DISEASE 01/01/2008  . History of hidradenitis suppurativa   . History of kidney stones   . History of thrush   . HIV DISEASE 07/24/2006   dx March 05  . HYPERTENSION 07/24/2006  . Hyperthyroidism 08/2006   Grave's Disease -diffuse radiotracer uptake 08/25/06 Thyroid scan-Cold nodule to R lower lobe of thyrorid  . Menometrorrhagia    hx of  . Nephrolithiasis   . 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 Pneumonia 2005  . 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 Sarcoidosis 02/08/2007   dx as a teenager in [place] from abnl CXR. Completed 2 yrs Prednisone after lung bx confirmation. No symptoms since then.  . Thyroid cancer ([place])   . THYROID NODULE, RIGHT 02/08/2007     Social History   Socioeconomic History  . Marital status: Single    Spouse name: Not on file  . Number of children: 1  . Years of education: Not on file  . Highest education level: Not on file  Occupational History  . Occupation: Secondary school teacher  Social Needs  . Financial resource strain: Not on file  . Food insecurity:    Worry: Not on file    Inability: Not on file  . Transportation needs:    Medical: Not on file    Non-medical: Not on file  Tobacco Use  . Smoking status: Former Smoker    Packs/day: 0.50  Years: 15.00    Pack years: 7.50    Types: Cigarettes    Start date: 04/12/2017  . Smokeless tobacco: Never Used  Substance and Sexual Activity  . Alcohol use: Yes    Alcohol/week: 0.0 standard drinks    Comment: social  . Drug use: No  . Sexual activity: Not Currently    Birth control/protection: Post-menopausal    Comment: declined condoms  Lifestyle  . Physical activity:    Days per week: Not on file    Minutes per session: Not on file  . Stress: Not on file  Relationships  . Social connections:     Talks on phone: Not on file    Gets together: Not on file    Attends religious service: Not on file    Active member of club or organization: Not on file    Attends meetings of clubs or organizations: Not on file    Relationship status: Not on file  . Intimate partner violence:    Fear of current or ex partner: Not on file    Emotionally abused: Not on file    Physically abused: Not on file    Forced sexual activity: Not on file  Other Topics Concern  . Not on file  Social History Narrative  . Not on file    Past Surgical History:  Procedure Laterality Date  . BREAST SURGERY  1997   Breast Reduction   . CYSTOSCOPY W/ URETERAL STENT REMOVAL  11/09/2012   Procedure: CYSTOSCOPY WITH STENT REMOVAL;  Surgeon: Alexis Frock, MD;  Location: [facility];  Service: Urology;  Laterality: Right;  . CYSTOSCOPY WITH RETROGRADE PYELOGRAM, URETEROSCOPY AND STENT PLACEMENT  11/09/2012   Procedure: CYSTOSCOPY WITH RETROGRADE PYELOGRAM, URETEROSCOPY AND STENT PLACEMENT;  Surgeon: Alexis Frock, MD;  Location: [facility];  Service: Urology;  Laterality: Left;  LEFT URETEROSCOPY, STONE MANIPULATION, left STENT exchange   . CYSTOSCOPY WITH STENT PLACEMENT  10/02/2012   Procedure: CYSTOSCOPY WITH STENT PLACEMENT;  Surgeon: Alexis Frock, MD;  Location: [facility];  Service: Urology;  Laterality: Left;  . DILATION AND CURETTAGE OF UTERUS  Feb 2004   s/p for 1st trimester nonviable pregnancy  . EYE SURGERY     sty under eyelid  . INCISE AND DRAIN ABCESS  Nov 03   s/p I &D for righ inframmary fold hidradenitis  . INCISION AND DRAINAGE PERITONSILLAR ABSCESS  Mar 03  . IR CV LINE INJECTION  06/07/2018  . IR IMAGING GUIDED PORT INSERTION  06/20/2018  . IR REMOVAL TUN ACCESS W/ PORT W/O FL MOD SED  06/20/2018  . IRRIGATION AND DEBRIDEMENT ABSCESS  01/31/2012   Procedure: IRRIGATION AND DEBRIDEMENT ABSCESS;  Surgeon: Shann Medal, MD;  Location: [facility];  Service: General;  Laterality: Right;  right breast and axilla    . NEPHROLITHOTOMY  10/02/2012   Procedure: NEPHROLITHOTOMY PERCUTANEOUS;  Surgeon: Alexis Frock, MD;  Location: [facility];  Service: Urology;  Laterality: Right;  First Stage Percutaneous Nephrolithotomy with Surgeon Access, Left Ureteral Stent    . NEPHROLITHOTOMY  10/04/2012   Procedure: NEPHROLITHOTOMY PERCUTANEOUS SECOND LOOK;  Surgeon: Alexis Frock, MD;  Location: [facility];  Service: Urology;  Laterality: Right;     . NEPHROLITHOTOMY  10/08/2012   Procedure: NEPHROLITHOTOMY PERCUTANEOUS;  Surgeon: Alexis Frock, MD;  Location: [facility];  Service: Urology;  Laterality: Right;  THIRD STAGE, nephrostomy tube exchange x 2  . NEPHROLITHOTOMY  10/11/2012   Procedure: NEPHROLITHOTOMY PERCUTANEOUS SECOND LOOK;  Surgeon: Alexis Frock, MD;  Location: [facility];  Service: Urology;  Laterality: Right;  RIGHT 4 STAGE PERCUTANOUS NEPHROLITHOTOMY, right URETEROSCOPY WITH HOLMIUM LASER   . PORTACATH PLACEMENT Left 05/17/2018   Procedure: INSERTION PORT-A-CATH;  Surgeon: Coralie Keens, MD;  Location: [facility];  Service: General;  Laterality: Left;  . RADICAL NECK DISSECTION  04/12/2017   limited/notes 04/12/2017  . RADICAL NECK DISSECTION N/A 04/12/2017   Procedure: RADICAL NECK DISSECTION;  Surgeon: Melida Quitter, MD;  Location: [facility];  Service: ENT;  Laterality: N/A;  limited neck dissection 2 hours total  . REDUCTION MAMMAPLASTY Bilateral 1998  . [place]  1994  . THYROIDECTOMY  04/12/2017   completion/notes 04/12/2017  . THYROIDECTOMY N/A 04/12/2017   Procedure: THYROIDECTOMY;  Surgeon: Melida Quitter, MD;  Location: [facility];  Service: ENT;  Laterality: N/A;  Completion Thyroidectomy  . TOTAL THYROIDECTOMY  2010    Family History  Problem Relation Age of Onset  . Hypertension Mother   . Cancer Mother        laryngeal  . Heart disease Mother        stent  . Hypertension Father   . Lung cancer Father 55       hx smoking  . Heart disease Other   . Hypertension Other   . Stroke  Other        Grandparent  . Kidney disease Other        Grandparent  . Diabetes Other        FH of Diabetes  . Hypertension Sister   . Cancer Maternal Uncle        Lung CA  . Hypertension Brother   . Hypertension Sister   . Breast cancer Maternal Aunt 65  . Breast cancer Paternal Aunt 64  . Prostate cancer Paternal Uncle   . Breast cancer Maternal Aunt        dx 60+  . Breast cancer Paternal Aunt        dx 45's  . Breast cancer Paternal Aunt        dx 82's  . Prostate cancer Paternal Uncle   . Lung cancer Paternal Uncle   . Breast cancer Cousin 50  . Breast cancer Cousin        dx <50  . Breast cancer Cousin        dx <50  . Breast cancer Cousin        dx <50    Allergies  Allergen Reactions  . Tape Rash    Rash   . Genvoya [Elviteg-Cobic-Emtricit-Tenofaf] Hives  . Lisinopril Cough  . Xylocaine [Lidocaine]     Unknown , patient states she has had lidocaine for IV starts and at the dentist with no problems  . Valsartan Cough    Pt states she tolerates medicine now    Current Outpatient Medications on File Prior to Visit  Medication Sig Dispense Refill  . amLODipine (NORVASC) 10 MG tablet Take 1 tablet (10 mg total) by mouth daily. For blood pressure. 90 tablet 3  . atorvastatin (LIPITOR) 20 MG tablet Take 1 tablet by mouth every evening for high cholesterol. NEED APPOINTMENT FOR ANY MORE REFILLS 30 tablet 0  . dexamethasone (DECADRON) 4 MG tablet Take 2 tablets (8 mg total) by mouth 2 (two) times daily. Start the day before Taxotere. Take once the day after, then 2 times a day x 2d. 30 tablet 1  . dolutegravir ([name]) 50 MG tablet Take 1 tablet (50 mg total) by mouth daily. [place]  tablet 11  . doxycycline (VIBRA-TABS) 100 MG tablet Take 1 tablet (100 mg total) by mouth daily. 90 tablet 2  . emtricitabine-tenofovir AF (DESCOVY) 200-25 MG tablet Take 1 tablet by mouth daily. 30 tablet 11  . irbesartan-hydrochlorothiazide (AVALIDE) 150-12.5 MG tablet TAKE 1 TABLET BY  MOUTH ONCE DAILY FOR BLOOD PRESSURE. 90 tablet 1  . levothyroxine (SYNTHROID, LEVOTHROID) 175 MCG tablet Take 1 tablet by mouth every morning on an empty stomach with a full glass of water. No food or other medications for 30 minutes. 30 tablet 0  . lidocaine-prilocaine (EMLA) cream Apply to affected area once 30 g 3  . LORazepam (ATIVAN) 0.5 MG tablet TAKE 1 TABLET (0.5 MG TOTAL) BY MOUTH AT BEDTIME AS NEEDED (NAUSEA OR VOMITING). 20 tablet 0  . metFORMIN (GLUCOPHAGE) 500 MG tablet Take 1 tablet (500 mg total) by mouth 2 (two) times daily with a meal. 180 tablet 3  . metoprolol succinate (TOPROL-XL) 50 MG 24 hr tablet TAKE 1 TABLET (50 MG TOTAL) BY MOUTH DAILY. TAKE WITH OR IMMEDIATELY FOLLOWING A MEAL. 90 tablet 1  . mometasone (NASONEX) 50 MCG/ACT nasal spray PLACE 2 SPRAYS INTO THE NOSE DAILY. 17 g 3  . Multiple Vitamin (MULTIVITAMIN) tablet Take 1 tablet by mouth daily.    . ONE TOUCH LANCETS MISC Use as instructed to test blood sugar daily 200 each 5  . ONETOUCH VERIO test strip USE AS INSTRUCTED TO TEST BLOOD SUGAR DAILY NEED APPOINTMENT FOR ANY MORE REFILLS 50 each 0  . oxyCODONE (OXY IR/ROXICODONE) 5 MG immediate release tablet Take 1 tablet (5 mg total) by mouth every 6 (six) hours as needed for moderate pain or severe pain. 20 tablet 0  . pantoprazole (PROTONIX) 40 MG tablet Take 40 mg by mouth daily as needed (for indigestion/acid reflux.).     Marland Kitchen potassium chloride (K-DUR) 10 MEQ tablet Take 2 tablets (20 mEq total) by mouth 2 (two) times daily. 20 tablet 0  . PROAIR RESPICLICK 948 (90 Base) MCG/ACT AEPB INHALE 2 PUFFS INTO THE LUNGS EVERY 6 HOURS AS NEEDED FOR WHEEZING OR SHORTNESS OF BREATH. 1 each 2  . prochlorperazine (COMPAZINE) 10 MG tablet TAKE 1 TABLET BY MOUTH EVERY 6 HOURS AS NEEDED FOR NAUSEA OR VOMITING 30 tablet 2  . traMADol (ULTRAM) 50 MG tablet Take 1 tablet (50 mg total) by mouth every 6 (six) hours as needed. 30 tablet 0   No current facility-administered medications on  file prior to visit.     BP 122/82   Pulse 74   Temp 98 F (36.7 C) (Oral)   Ht 5\' 5"  (1.651 m)   Wt 233 lb (105.7 kg)   LMP 03/31/2014 Comment: Negative Serum HCG 06/07/17  BMI 38.77 kg/m    Objective:   Physical Exam  Constitutional: She is oriented to person, place, and time. She appears well-nourished.  HENT:  Mouth/Throat: No oropharyngeal exudate.  Eyes: Pupils are equal, round, and reactive to light. EOM are normal.  Neck: Neck supple.  Cardiovascular: Normal rate and regular rhythm.  Respiratory: Effort normal and breath sounds normal.  GI: Soft. Bowel sounds are normal. There is no tenderness.  Musculoskeletal: Normal range of motion.  Neurological: She is alert and oriented to person, place, and time.  Skin: Skin is warm and dry.  Psychiatric: She has a normal mood and affect.           Assessment & Plan:

## 2018-09-10 NOTE — Telephone Encounter (Signed)
Called peer to peer for patient to receive post neoadjuvant chemotherapy to determine treatment response.  Spoke with Leanne Chang. For 2.5 minutes, and then Arturo x5 minutes.  Peer to peer scheduled for tomorrow, 09/11/2018 at 315pm with Dr. Kathryne Hitch.  She will call [phone] (my office #).    Wilber Bihari, NP

## 2018-09-10 NOTE — Assessment & Plan Note (Signed)
Compliant to atorvastatin. Repeat lipids pending, she will have them drawn later this week at the cancer center.

## 2018-09-10 NOTE — Assessment & Plan Note (Signed)
Stable in the office today, continue current regimen.  BMP reviewed. 

## 2018-09-10 NOTE — Assessment & Plan Note (Signed)
Immunizations UTD. Pap smear UTD per patient. Mammogram UTD, scheduled for this week.  Colonoscopy due, orders placed. She will discuss this with her oncologist as well. Recommended a healthy diet, regular exercise as tolerated.  Exam stable. Labs reviewed, lipid panel pending. Follow up in 1 year for CPE.

## 2018-09-10 NOTE — Assessment & Plan Note (Signed)
Following with infectious disease. CD4 count in September 2019 within range. Continue current antiviral treatment. She is managed on chemotherapy for right breast cancer.

## 2018-09-10 NOTE — Assessment & Plan Note (Signed)
Recent TSH stable, continue levothyroxine 175 mcg. Reminded her to take with water only, no food or meds for 30 minutes, separate from PPI by 4 hours.

## 2018-09-10 NOTE — Assessment & Plan Note (Signed)
Recent A1C stable. Continue Metformin.  Managed on statin and ARB. Pneumonia vaccination UTD. Foot exam UTD.  Follow up in 6 months.

## 2018-09-10 NOTE — Assessment & Plan Note (Signed)
Stable and with infrequent use of albuterol inhaler.

## 2018-09-10 NOTE — Assessment & Plan Note (Addendum)
Completes chemotherapy Friday this week, then will undergo surgery to right breast sometime within the next month, then will undergo radiation therapy.   Recent oncology note reviewed.

## 2018-09-10 NOTE — Assessment & Plan Note (Signed)
Doing well on pantoprazole 40 mg, continue same.

## 2018-09-10 NOTE — Patient Instructions (Signed)
Take the fluconazole tablet once for yeast infection, may repeat in 3 days if needed.  You will be contacted regarding your referral to GI for the colonoscopy.  Please let us know if you have not been contacted within one week.   Have the cancer center draw your cholesterol level on Friday this week.  Start exercising. You should be getting 150 minutes of exercise weekly.  It's important to improve your diet by reducing consumption of fast food, fried food, processed snack foods, sugary drinks. Increase consumption of fresh vegetables and fruits, whole grains, water.  Ensure you are drinking 64 ounces of water daily.  Please schedule a follow up appointment in 6 months for diabetes check.   It was a pleasure to see you today!   Preventive Care 40-64 Years, Female Preventive care refers to lifestyle choices and visits with your health care provider that can promote health and wellness. What does preventive care include?  A yearly physical exam. This is also called an annual well check.  Dental exams once or twice a year.  Routine eye exams. Ask your health care provider how often you should have your eyes checked.  Personal lifestyle choices, including: ? Daily care of your teeth and gums. ? Regular physical activity. ? Eating a healthy diet. ? Avoiding tobacco and drug use. ? Limiting alcohol use. ? Practicing safe sex. ? Taking low-dose aspirin daily starting at age 37. ? Taking vitamin and mineral supplements as recommended by your health care provider. What happens during an annual well check? The services and screenings done by your health care provider during your annual well check will depend on your age, overall health, lifestyle risk factors, and family history of disease. Counseling Your health care provider may ask you questions about your:  Alcohol use.  Tobacco use.  Drug use.  Emotional well-being.  Home and relationship well-being.  Sexual  activity.  Eating habits.  Work and work Statistician.  Method of birth control.  Menstrual cycle.  Pregnancy history.  Screening You may have the following tests or measurements:  Height, weight, and BMI.  Blood pressure.  Lipid and cholesterol levels. These may be checked every 5 years, or more frequently if you are over 21 years old.  Skin check.  Lung cancer screening. You may have this screening every year starting at age 39 if you have a 30-pack-year history of smoking and currently smoke or have quit within the past 15 years.  Fecal occult blood test (FOBT) of the stool. You may have this test every year starting at age 4.  Flexible sigmoidoscopy or colonoscopy. You may have a sigmoidoscopy every 5 years or a colonoscopy every 10 years starting at age 60.  Hepatitis C blood test.  Hepatitis B blood test.  Sexually transmitted disease (STD) testing.  Diabetes screening. This is done by checking your blood sugar (glucose) after you have not eaten for a while (fasting). You may have this done every 1-3 years.  Mammogram. This may be done every 1-2 years. Talk to your health care provider about when you should start having regular mammograms. This may depend on whether you have a family history of breast cancer.  BRCA-related cancer screening. This may be done if you have a family history of breast, ovarian, tubal, or peritoneal cancers.  Pelvic exam and Pap test. This may be done every 3 years starting at age 40. Starting at age 27, this may be done every 5 years if you have a  Pap test in combination with an HPV test.  Bone density scan. This is done to screen for osteoporosis. You may have this scan if you are at high risk for osteoporosis.  Discuss your test results, treatment options, and if necessary, the need for more tests with your health care provider. Vaccines Your health care provider may recommend certain vaccines, such as:  Influenza vaccine. This is  recommended every year.  Tetanus, diphtheria, and acellular pertussis (Tdap, Td) vaccine. You may need a Td booster every 10 years.  Varicella vaccine. You may need this if you have not been vaccinated.  Zoster vaccine. You may need this after age 69.  Measles, mumps, and rubella (MMR) vaccine. You may need at least one dose of MMR if you were born in 1957 or later. You may also need a second dose.  Pneumococcal 13-valent conjugate (PCV13) vaccine. You may need this if you have certain conditions and were not previously vaccinated.  Pneumococcal polysaccharide (PPSV23) vaccine. You may need one or two doses if you smoke cigarettes or if you have certain conditions.  Meningococcal vaccine. You may need this if you have certain conditions.  Hepatitis A vaccine. You may need this if you have certain conditions or if you travel or work in places where you may be exposed to hepatitis A.  Hepatitis B vaccine. You may need this if you have certain conditions or if you travel or work in places where you may be exposed to hepatitis B.  Haemophilus influenzae type b (Hib) vaccine. You may need this if you have certain conditions.  Talk to your health care provider about which screenings and vaccines you need and how often you need them. This information is not intended to replace advice given to you by your health care provider. Make sure you discuss any questions you have with your health care provider. Document Released: 10/30/2015 Document Revised: 06/22/2016 Document Reviewed: 08/04/2015 Elsevier Interactive Patient Education  [employer].

## 2018-09-10 NOTE — Assessment & Plan Note (Signed)
No recent flares per patient. Respiratory exam unremarkable.

## 2018-09-10 NOTE — Assessment & Plan Note (Signed)
Following with dermatology and ID. Compliant to Doxycycline. Rx for fluconazole provided for likely antibiotic induced yeast infection.

## 2018-09-10 NOTE — Assessment & Plan Note (Signed)
Using lorazepam PRN that is provided by oncologist. Discussed potential for dependence and to use sparingly.

## 2018-09-11 ENCOUNTER — Telehealth: Payer: Self-pay | Admitting: Adult Health

## 2018-09-11 NOTE — Telephone Encounter (Signed)
Spent approx 2 minutes on phone with Dr. Benito Mccreedy for peer to peer for patient to evaluate post neoadjuvant chemotherapy breast MRI.  Approval # [id number].   Wilber Bihari, NP

## 2018-09-12 ENCOUNTER — Other Ambulatory Visit: Payer: Self-pay | Admitting: Primary Care

## 2018-09-12 DIAGNOSIS — E890 Postprocedural hypothyroidism: Secondary | ICD-10-CM

## 2018-09-14 ENCOUNTER — Ambulatory Visit (HOSPITAL_COMMUNITY): Admission: RE | Admit: 2018-09-14 | Payer: Managed Care, Other (non HMO) | Source: Ambulatory Visit

## 2018-09-14 ENCOUNTER — Inpatient Hospital Stay: Payer: PRIVATE HEALTH INSURANCE

## 2018-09-14 ENCOUNTER — Encounter: Payer: Self-pay | Admitting: Nurse Practitioner

## 2018-09-14 ENCOUNTER — Encounter: Payer: Self-pay | Admitting: *Deleted

## 2018-09-14 ENCOUNTER — Other Ambulatory Visit: Payer: Self-pay | Admitting: *Deleted

## 2018-09-14 ENCOUNTER — Inpatient Hospital Stay (HOSPITAL_BASED_OUTPATIENT_CLINIC_OR_DEPARTMENT_OTHER): Payer: PRIVATE HEALTH INSURANCE | Admitting: Nurse Practitioner

## 2018-09-14 VITALS — BP 148/71 | HR 80 | Temp 98.4°F | Resp 16

## 2018-09-14 DIAGNOSIS — M549 Dorsalgia, unspecified: Secondary | ICD-10-CM | POA: Diagnosis not present

## 2018-09-14 DIAGNOSIS — C50311 Malignant neoplasm of lower-inner quadrant of right female breast: Secondary | ICD-10-CM

## 2018-09-14 DIAGNOSIS — Z803 Family history of malignant neoplasm of breast: Secondary | ICD-10-CM

## 2018-09-14 DIAGNOSIS — Z170 Estrogen receptor positive status [ER+]: Principal | ICD-10-CM

## 2018-09-14 DIAGNOSIS — C50911 Malignant neoplasm of unspecified site of right female breast: Secondary | ICD-10-CM

## 2018-09-14 DIAGNOSIS — C50211 Malignant neoplasm of upper-inner quadrant of right female breast: Secondary | ICD-10-CM

## 2018-09-14 DIAGNOSIS — Z5111 Encounter for antineoplastic chemotherapy: Secondary | ICD-10-CM | POA: Diagnosis not present

## 2018-09-14 DIAGNOSIS — Z95828 Presence of other vascular implants and grafts: Secondary | ICD-10-CM

## 2018-09-14 DIAGNOSIS — L732 Hidradenitis suppurativa: Secondary | ICD-10-CM | POA: Diagnosis not present

## 2018-09-14 DIAGNOSIS — C73 Malignant neoplasm of thyroid gland: Secondary | ICD-10-CM

## 2018-09-14 LAB — CBC WITH DIFFERENTIAL/PLATELET
ABS IMMATURE GRANULOCYTES: 0.12 10*3/uL — AB (ref 0.00–0.07)
BASOS PCT: 1 %
Basophils Absolute: 0 10*3/uL (ref 0.0–0.1)
Eosinophils Absolute: 0.1 10*3/uL (ref 0.0–0.5)
Eosinophils Relative: 2 %
HCT: 29.8 % — ABNORMAL LOW (ref 36.0–46.0)
Hemoglobin: 9.4 g/dL — ABNORMAL LOW (ref 12.0–15.0)
IMMATURE GRANULOCYTES: 2 %
LYMPHS PCT: 35 %
Lymphs Abs: 2.5 10*3/uL (ref 0.7–4.0)
MCH: 29.8 pg (ref 26.0–34.0)
MCHC: 31.5 g/dL (ref 30.0–36.0)
MCV: 94.6 fL (ref 80.0–100.0)
Monocytes Absolute: 0.8 10*3/uL (ref 0.1–1.0)
Monocytes Relative: 11 %
NEUTROS ABS: 3.7 10*3/uL (ref 1.7–7.7)
NEUTROS PCT: 49 %
PLATELETS: 364 10*3/uL (ref 150–400)
RBC: 3.15 MIL/uL — AB (ref 3.87–5.11)
RDW: 22.3 % — ABNORMAL HIGH (ref 11.5–15.5)
WBC: 7.3 10*3/uL (ref 4.0–10.5)
nRBC: 0 % (ref 0.0–0.2)

## 2018-09-14 LAB — COMPREHENSIVE METABOLIC PANEL
ALBUMIN: 3.6 g/dL (ref 3.5–5.0)
ALT: 12 U/L (ref 0–44)
ANION GAP: 10 (ref 5–15)
AST: 18 U/L (ref 15–41)
Alkaline Phosphatase: 135 U/L — ABNORMAL HIGH (ref 38–126)
BILIRUBIN TOTAL: 0.2 mg/dL — AB (ref 0.3–1.2)
BUN: 17 mg/dL (ref 6–20)
CHLORIDE: 105 mmol/L (ref 98–111)
CO2: 28 mmol/L (ref 22–32)
Calcium: 9.6 mg/dL (ref 8.9–10.3)
Creatinine, Ser: 1.28 mg/dL — ABNORMAL HIGH (ref 0.44–1.00)
GFR calc Af Amer: 56 mL/min — ABNORMAL LOW (ref >60)
GFR, EST NON AFRICAN AMERICAN: 49 mL/min — AB (ref >60)
GLUCOSE: 129 mg/dL — AB (ref 70–99)
POTASSIUM: 3.3 mmol/L — AB (ref 3.5–5.1)
Sodium: 143 mmol/L (ref 135–145)
TOTAL PROTEIN: 8.6 g/dL — AB (ref 6.5–8.1)

## 2018-09-14 MED ORDER — PALONOSETRON HCL INJECTION 0.25 MG/5ML
INTRAVENOUS | Status: AC
  Filled 2018-09-14: qty 5

## 2018-09-14 MED ORDER — OXYCODONE-ACETAMINOPHEN 5-325 MG PO TABS: 1.0000 | ORAL_TABLET | Freq: Once | ORAL | Status: DC

## 2018-09-14 MED ORDER — SODIUM CHLORIDE 0.9 % IV SOLN
Freq: Once | INTRAVENOUS | Status: AC
  Administered 2018-09-14: 11:00:00 via INTRAVENOUS
  Filled 2018-09-14: qty 5

## 2018-09-14 MED ORDER — SODIUM CHLORIDE 0.9 % IV SOLN
800.0000 mg/m2 | Freq: Once | INTRAVENOUS | Status: AC
  Administered 2018-09-14: 1824 mg via INTRAVENOUS
  Filled 2018-09-14: qty 47.97

## 2018-09-14 MED ORDER — SODIUM CHLORIDE 0.9% FLUSH
10.0000 mL | INTRAVENOUS | Status: DC | PRN
  Administered 2018-09-14: 10 mL
  Filled 2018-09-14: qty 10

## 2018-09-14 MED ORDER — SODIUM CHLORIDE 0.9 % IV SOLN
560.0000 mg | Freq: Once | INTRAVENOUS | Status: AC
  Administered 2018-09-14: 560 mg via INTRAVENOUS
  Filled 2018-09-14: qty 56

## 2018-09-14 MED ORDER — TRASTUZUMAB CHEMO 150 MG IV SOLR
6.0000 mg/kg | Freq: Once | INTRAVENOUS | Status: AC
  Administered 2018-09-14: 672 mg via INTRAVENOUS
  Filled 2018-09-14: qty 32

## 2018-09-14 MED ORDER — DIPHENHYDRAMINE HCL 25 MG PO CAPS
ORAL_CAPSULE | ORAL | Status: AC
  Filled 2018-09-14: qty 1

## 2018-09-14 MED ORDER — DIPHENHYDRAMINE HCL 25 MG PO CAPS
25.0000 mg | ORAL_CAPSULE | Freq: Once | ORAL | Status: AC
  Administered 2018-09-14: 25 mg via ORAL

## 2018-09-14 MED ORDER — SODIUM CHLORIDE 0.9 % IV SOLN
Freq: Once | INTRAVENOUS | Status: AC
  Administered 2018-09-14: 10:00:00 via INTRAVENOUS
  Filled 2018-09-14: qty 250

## 2018-09-14 MED ORDER — SODIUM CHLORIDE 0.9% FLUSH
10.0000 mL | INTRAVENOUS | Status: DC | PRN
  Filled 2018-09-14: qty 10

## 2018-09-14 MED ORDER — PALONOSETRON HCL INJECTION 0.25 MG/5ML
0.2500 mg | Freq: Once | INTRAVENOUS | Status: AC
  Administered 2018-09-14: 0.25 mg via INTRAVENOUS

## 2018-09-14 MED ORDER — ACETAMINOPHEN 325 MG PO TABS
650.0000 mg | ORAL_TABLET | Freq: Once | ORAL | Status: AC
  Administered 2018-09-14: 650 mg via ORAL

## 2018-09-14 MED ORDER — ACETAMINOPHEN 325 MG PO TABS
ORAL_TABLET | ORAL | Status: AC
  Filled 2018-09-14: qty 2

## 2018-09-14 MED ORDER — OXYCODONE-ACETAMINOPHEN 5-325 MG PO TABS
1.0000 | ORAL_TABLET | Freq: Once | ORAL | Status: AC
  Administered 2018-09-14: 1 via ORAL

## 2018-09-14 MED ORDER — HEPARIN SOD (PORK) LOCK FLUSH 100 UNIT/ML IV SOLN
500.0000 [IU] | Freq: Once | INTRAVENOUS | Status: AC | PRN
  Administered 2018-09-14: 500 [IU]
  Filled 2018-09-14: qty 5

## 2018-09-14 MED ORDER — OXYCODONE-ACETAMINOPHEN 5-325 MG PO TABS
ORAL_TABLET | ORAL | Status: AC
  Filled 2018-09-14: qty 1

## 2018-09-14 NOTE — Progress Notes (Signed)
Port accessed and labs drawn by Chief Strategy Officer.

## 2018-09-14 NOTE — Progress Notes (Signed)
  [place] OFFICE PROGRESS NOTE   Diagnosis: Rest cancer  INTERVAL HISTORY:   Ms. Schorr returns for follow-up.  She completed cycle 5 carboplatin/gemcitabine/Herceptin 08/23/2018.  She had intermittent nausea for multiple days.  No significant mouth sores.  She utilizes Biotene mouth rinse.  She initially had some loose stools.  Bowel habits have since returned to baseline.  She notes progressive fatigue, some dyspnea on exertion.  No fever or cough.  She continues doxycycline for hidradenitis.  She had left-sided back pain earlier today.  Objective:  Vital signs in last 24 hours:  Temperature 98.4, heart rate 80, respirations 16, blood pressure 148/71  Last menstrual period 03/31/2014.    HEENT: White coating over tongue.  No buccal thrush. Resp: Lungs clear bilaterally. Cardio: Regular rate and rhythm. GI: Abdomen soft and nontender.  No hepatomegaly.  2 small dressings at the abdominal wall covering areas of hidradenitis. Vascular: No leg edema. Neuro: Alert and oriented. Port-A-Cath without erythema.  Lab Results:  Lab Results  Component Value Date   WBC 7.3 09/14/2018   HGB 9.4 (L) 09/14/2018   HCT 29.8 (L) 09/14/2018   MCV 94.6 09/14/2018   PLT 364 09/14/2018   NEUTROABS 3.7 09/14/2018    Imaging:  No results found.  Medications: I have reviewed the patient's current medications.  Assessment/Plan: 1. 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 Sykora appears stable.  She has completed 5 cycles of neoadjuvant systemic therapy.  Plan to proceed with cycle 6  today as scheduled.  She will undergo a restaging MRI of the breast tomorrow.  She has a follow-up appointment with the surgeon early next week.  She will return for a follow-up visit here on 09/19/2018.  She will contact the office in the interim with any problems.    Ned Card ANP/GNP-BC   09/14/2018  12:22 PM

## 2018-09-14 NOTE — Patient Instructions (Signed)
[facility] Discharge Instructions for Patients Receiving Chemotherapy  Today you received the following chemotherapy agents Gemcitabine (GEMZAR), Carboplatin (Paraplatin) & Trastuzumab (Herceptin).  To help prevent nausea and vomiting after your treatment, we encourage you to take your nausea medication as prescribed.  If you develop nausea and vomiting that is not controlled by your nausea medication, call the clinic.   BELOW ARE SYMPTOMS THAT SHOULD BE REPORTED IMMEDIATELY:  *FEVER GREATER THAN 100.5 F  *CHILLS WITH OR WITHOUT FEVER  NAUSEA AND VOMITING THAT IS NOT CONTROLLED WITH YOUR NAUSEA MEDICATION  *UNUSUAL SHORTNESS OF BREATH  *UNUSUAL BRUISING OR BLEEDING  TENDERNESS IN MOUTH AND THROAT WITH OR WITHOUT PRESENCE OF ULCERS  *URINARY PROBLEMS  *BOWEL PROBLEMS  UNUSUAL RASH Items with * indicate a potential emergency and should be followed up as soon as possible.  Feel free to call the clinic should you have any questions or concerns. The clinic phone number is [phone].  Please show the [place] at check-in to the Emergency Department and triage nurse.

## 2018-09-14 NOTE — Progress Notes (Signed)
Received notification from scheduler and Lattie Haw NP that pt arrived for breast MRI and was cancelled, per pt she was not notified. Informed by pt that chemo appt was to be earlier in the morning after MRI-not as currently scheduled to start at 12pm and that she was not notified of time change-today is pt last chemo treatment and family members are coming to see her ring the bell and have made plans around her treatment. This RN called central scheduling to see reasoning behind cancellation. Was notified that MRI was not authorized by insurance. Pointed out note from [place] NP that authorization was received and documented on 11/26 and noted that MRI was cx on 11/27. Unable to r/s breast MRI for 11/29. Received new appt date and time for 11/30 at 12pm. Confirmed appt with pt.

## 2018-09-15 ENCOUNTER — Ambulatory Visit (HOSPITAL_COMMUNITY)
Admission: RE | Admit: 2018-09-15 | Discharge: 2018-09-15 | Disposition: A | Discharge status: Home / Self Care | Payer: Managed Care, Other (non HMO) | Source: Ambulatory Visit | Attending: Adult Health | Admitting: Adult Health

## 2018-09-15 DIAGNOSIS — R590 Localized enlarged lymph nodes: Secondary | ICD-10-CM | POA: Insufficient documentation

## 2018-09-15 DIAGNOSIS — C50311 Malignant neoplasm of lower-inner quadrant of right female breast: Secondary | ICD-10-CM

## 2018-09-15 DIAGNOSIS — R234 Changes in skin texture: Secondary | ICD-10-CM | POA: Insufficient documentation

## 2018-09-15 DIAGNOSIS — Z170 Estrogen receptor positive status [ER+]: Secondary | ICD-10-CM | POA: Diagnosis present

## 2018-09-15 MED ORDER — GADOBUTROL 1 MMOL/ML IV SOLN
10.0000 mL | Freq: Once | INTRAVENOUS | Status: AC | PRN
  Administered 2018-09-15: 10 mL via INTRAVENOUS

## 2018-09-17 ENCOUNTER — Ambulatory Visit: Payer: Self-pay | Admitting: Surgery

## 2018-09-17 ENCOUNTER — Inpatient Hospital Stay: Payer: Managed Care, Other (non HMO) | Attending: Oncology

## 2018-09-17 DIAGNOSIS — Z170 Estrogen receptor positive status [ER+]: Secondary | ICD-10-CM

## 2018-09-17 DIAGNOSIS — C50911 Malignant neoplasm of unspecified site of right female breast: Secondary | ICD-10-CM

## 2018-09-17 DIAGNOSIS — Z5189 Encounter for other specified aftercare: Secondary | ICD-10-CM | POA: Diagnosis not present

## 2018-09-17 DIAGNOSIS — C50311 Malignant neoplasm of lower-inner quadrant of right female breast: Secondary | ICD-10-CM

## 2018-09-17 DIAGNOSIS — Z5111 Encounter for antineoplastic chemotherapy: Secondary | ICD-10-CM | POA: Insufficient documentation

## 2018-09-17 DIAGNOSIS — Z5112 Encounter for antineoplastic immunotherapy: Secondary | ICD-10-CM | POA: Insufficient documentation

## 2018-09-17 DIAGNOSIS — C50211 Malignant neoplasm of upper-inner quadrant of right female breast: Secondary | ICD-10-CM | POA: Insufficient documentation

## 2018-09-17 MED ORDER — PEGFILGRASTIM-CBQV 6 MG/0.6ML ~~LOC~~ SOSY
PREFILLED_SYRINGE | SUBCUTANEOUS | Status: AC
  Filled 2018-09-17: qty 0.6

## 2018-09-17 MED ORDER — PEGFILGRASTIM-CBQV 6 MG/0.6ML ~~LOC~~ SOSY
6.0000 mg | PREFILLED_SYRINGE | Freq: Once | SUBCUTANEOUS | Status: AC
  Administered 2018-09-17: 6 mg via SUBCUTANEOUS

## 2018-09-17 NOTE — H&P (Signed)
[name] Documented: 09/17/2018 3:04 PM Location: [facility] Patient #: [id number] DOB: 1966/01/08 Single / Language: [name] / Race: Black or African American Female  History of Present Illness Marcello Moores A. 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 She finished her last cycle chemotherapy 3 days ago. Repeat MRI has not been read by 5 reviewed the films myself. There appears to be significant decline in size of the right medial breast lesion. Her hidradenitis has been stable. We discussed options of mastectomy. She has seen plastic surgery but they recommended delayed reconstruction given her hidradenitis. We discussed options of breast conservation versus mastectomy and reconstruction today and the pros and cons of each.  The patient is a 52 year old female.   Problem List/Past Medical Sharyn Lull R. Brooks, CMA; 09/17/2018 3:04 PM) ABSCESS OF BREAST, RIGHT (N61.1) ABSCESS OF AXILLA, LEFT (L02.412) BREAST CANCER, RIGHT (C50.911) HIDRADENITIS (L73.2)  Past Surgical History Sharyn Lull R. Brooks, CMA; 09/17/2018 3:04 PM) Breast Reconstruction Bilateral. Foot Surgery Right. Thyroid Surgery  Diagnostic Studies History Sharyn Lull R. Rolena Infante, CMA; 09/17/2018 3:04 PM) Colonoscopy never Pap Smear never  Allergies Sharyn Lull R. Brooks, CMA; 09/17/2018 3:04 PM) Tape 1"X5yd *MEDICAL DEVICES AND SUPPLIES* clear tape Genvoya *ANTIVIRALS* Lisinopril *ANTIHYPERTENSIVES* Xylocaine (Cardiac) *ANTIARRHYTHMICS* Valsartan *ANTIHYPERTENSIVES*  Medication History (Michelle R. Brooks, CMA; 09/17/2018 3:04 PM) Atorvastatin Calcium ('20MG'$  Tablet, Oral) Active. HydrALAZINE HCl ('25MG'$  Tablet, Oral) Active. Levothyroxine Sodium (175MCG Tablet, Oral) Active. Levothyroxine Sodium (200MCG Tablet, Oral) Active. Losartan Potassium-HCTZ (100-'25MG'$  Tablet, Oral) Active. MetFORMIN HCl  ('500MG'$  Tablet, Oral) Active. Metoprolol Succinate ER ('50MG'$  Tablet ER 24HR, Oral) Active. Mometasone Furoate (50MCG/ACT Suspension, Nasal) Active. Odefsey (200-25-'25MG'$  Tablet, Oral) Active. OneTouch Delica Lancets 87F Active. OneTouch Verio (In Vitro) Active. ProAir RespiClick (643 (90 Base)MCG/ACT Aero Pow Br Act, Inhalation) Active. Medications Reconciled  Social History Sharyn Lull R. Brooks, CMA; 09/17/2018 3:04 PM) Alcohol use Occasional alcohol use. No caffeine use No drug use Tobacco use Current every day smoker.  Family History Sharyn Lull R. Rolena Infante, CMA; 09/17/2018 3:04 PM) Arthritis Family Members In General. Breast Cancer Family Members In [place] Mother. Diabetes Mellitus Family Members In General. Heart Disease Family Members In General. Hypertension Family Members In General, Father, Mother, Sister. Kidney Disease Family Members In General. Thyroid problems Family Members In [place], Son.  Pregnancy / Birth History Sharyn Lull R. Rolena Infante, CMA; 09/17/2018 3:04 PM) Age at menarche 37 years. Age of menopause <47 Contraceptive History Depo-provera. Gravida 1 Irregular periods Maternal age 41-30 Para 1  Other Problems Sharyn Lull R. Brooks, CMA; 09/17/2018 3:04 PM) High blood pressure HIV-positive Kidney Stone Thyroid Disease    Vitals Sharyn Lull R. Brooks CMA; 09/17/2018 3:04 PM) 09/17/2018 3:04 PM Weight: 233 lb Height: 65in Body Surface Area: 2.11 m Body Mass Index: 38.77 kg/m  BP: 122/84 (Sitting, Left Arm, Standard)      Physical Exam (Mariadelcarmen Corella A. Kianah Harries MD; 09/17/2018 3:24 PM)  General Mental Status-Alert. General Appearance-Consistent with stated age. Hydration-Well hydrated. Voice-Normal.  Integumentary Note: Bilateral inframammary hidradenitis and bilateral axillary hidradenitis which appears stable today with no active lesion.  Head and Neck Head-normocephalic, atraumatic with no lesions or  palpable masses.  Eye Eyeball - Bilateral-Extraocular movements intact. Sclera/Conjunctiva - Bilateral-No scleral icterus.  Breast Note: Right breast shows no mass lesion. Left breast shows no mass lesion. Breast reduction scars noted.  Cardiovascular Cardiovascular examination reveals -on palpation PMI is normal in location and amplitude, no palpable S3 or S4. Normal cardiac borders.,  normal heart sounds, regular rate and rhythm with no murmurs, carotid auscultation reveals no bruits and normal pedal pulses bilaterally.  Abdomen Inspection Inspection of the abdomen reveals - No Hernias. Skin - Scar - no surgical scars. Palpation/Percussion Palpation and Percussion of the abdomen reveal - Soft, Non Tender, No Rebound tenderness, No Rigidity (guarding) and No hepatosplenomegaly. Auscultation Auscultation of the abdomen reveals - Bowel sounds normal.  Neurologic Neurologic evaluation reveals -alert and oriented x 3 with no impairment of recent or remote memory. Mental Status-Normal.  Musculoskeletal Normal Exam - Left-Upper Extremity Strength Normal and Lower Extremity Strength Normal. Normal Exam - Right-Upper Extremity Strength Normal, Lower Extremity Weakness.    Assessment & Plan (Odarius Dines A. Odaliz Mcqueary MD; 09/17/2018 3:21 PM)  BREAST CANCER, RIGHT (C50.911) Impression: Good response to chemotherapy. Discussed options of mastectomy with reconstruction versus breast conservation. She is seeing plastic surgery due to her hidradenitis they do not recommend immediate reconstruction. After discussion of all options and the pros and cons of each and long-term expectations possible treatments required, she is opted for right breast C localized lumpectomy with right axillary sentinel lymph node mapping. She may not map well due to her previous breast reduction in 2 dyes were used. If that fails she may require lymph node dissection and she is aware of the above. Risk of lumpectomy  include bleeding, infection, seroma, more surgery, use of seed/wire, wound care, cosmetic deformity and the need for other treatments, death , blood clots, death. Pt agrees to proceed. Risk of sentinel lymph node mapping include bleeding, infection, lymphedema, shoulder pain. stiffness, dye allergy. cosmetic deformity , blood clots, death, need for more surgery. Pt agres to proceed.  Current Plans You are being scheduled for surgery- Our schedulers will call you.  You should hear from our office's scheduling department within 5 working days about the location, date, and time of surgery. We try to make accommodations for patient's preferences in scheduling surgery, but sometimes the OR schedule or the surgeon's schedule prevents [place] from making those accommodations.  If you have not heard from our office [phone]) in 5 working days, call the office and ask for your surgeon's nurse.  If you have other questions about your diagnosis, plan, or surgery, call the office and ask for your surgeon's nurse.  We discussed the staging and pathophysiology of breast cancer. We discussed all of the different options for treatment for breast cancer including surgery, chemotherapy, radiation therapy, Herceptin, and antiestrogen therapy. We discussed a sentinel lymph node biopsy as she does not appear to having lymph node involvement right now. We discussed the performance of that with injection of radioactive tracer and blue dye. We discussed that she would have an incision underneath her axillary hairline. We discussed that there is a bout a 10-20% chance of having a positive node with a sentinel lymph node biopsy and we will await the permanent pathology to make any other first further decisions in terms of her treatment. One of these options might be to return to the operating room to perform an axillary lymph node dissection. We discussed about a 1-2% risk lifetime of chronic shoulder pain as well as  lymphedema associated with a sentinel lymph node biopsy. We discussed the options for treatment of the breast cancer which included lumpectomy versus a mastectomy. We discussed the performance of the lumpectomy with a wire placement. We discussed a 10-20% chance of a positive margin requiring reexcision in the operating room. We also discussed that she may need radiation  therapy or antiestrogen therapy or both if she undergoes lumpectomy. We discussed the mastectomy and the postoperative care for that as well. We discussed that there is no difference in her survival whether she undergoes lumpectomy with radiation therapy or antiestrogen therapy versus a mastectomy. There is a slight difference in the local recurrence rate being 3-5% with lumpectomy and about 1% with a mastectomy. We discussed the risks of operation including bleeding, infection, possible reoperation. She understands her further therapy will be based on what her stages at the time of her operation.  Pt Education - [facility] breast cancer surgery: discussed with patient and provided information. Pt Education - [facility] (After Breast Cancer) Class Info: discussed with patient and provided information. Pt Education - [facility] Breast P[facility] Education

## 2018-09-17 NOTE — Patient Instructions (Signed)
Pegfilgrastim injection ([place]) What is this medicine? PEGFILGRASTIM (PEG fil gra stim) is a long-acting granulocyte colony-stimulating factor that stimulates the growth of neutrophils, a type of white blood cell important in the body's fight against infection. It is used to reduce the incidence of fever and infection in patients with certain types of cancer who are receiving chemotherapy that affects the bone marrow, and to increase survival after being exposed to high doses of radiation. This medicine may be used for other purposes; ask your health care provider or pharmacist if you have questions. COMMON BRAND NAME(S): Neulasta What should I tell my health care provider before I take this medicine? They need to know if you have any of these conditions: -kidney disease -latex allergy -ongoing radiation therapy -sickle cell disease -skin reactions to acrylic adhesives (On-Body Injector only) -an unusual or allergic reaction to pegfilgrastim, filgrastim, other medicines, foods, dyes, or preservatives -pregnant or trying to get pregnant -breast-feeding How should I use this medicine? This medicine is for injection under the skin. If you get this medicine at home, you will be taught how to prepare and give the pre-filled syringe or how to use the On-body Injector. Refer to the patient Instructions for Use for detailed instructions. Use exactly as directed. Tell your healthcare provider immediately if you suspect that the On-body Injector may not have performed as intended or if you suspect the use of the On-body Injector resulted in a missed or partial dose. It is important that you put your used needles and syringes in a special sharps container. Do not put them in a trash can. If you do not have a sharps container, call your pharmacist or healthcare provider to get one. Talk to your pediatrician regarding the use of this medicine in children. While this drug may be prescribed for selected  conditions, precautions do apply. Overdosage: If you think you have taken too much of this medicine contact a poison control center or emergency room at once. NOTE: This medicine is only for you. Do not share this medicine with others. What if I miss a dose? It is important not to miss your dose. Call your doctor or health care professional if you miss your dose. If you miss a dose due to an On-body Injector failure or leakage, a new dose should be administered as soon as possible using a single prefilled syringe for manual use. What may interact with this medicine? Interactions have not been studied. Give your health care provider a list of all the medicines, herbs, non-prescription drugs, or dietary supplements you use. Also tell them if you smoke, drink alcohol, or use illegal drugs. Some items may interact with your medicine. This list may not describe all possible interactions. Give your health care provider a list of all the medicines, herbs, non-prescription drugs, or dietary supplements you use. Also tell them if you smoke, drink alcohol, or use illegal drugs. Some items may interact with your medicine. What should I watch for while using this medicine? You may need blood work done while you are taking this medicine. If you are going to need a MRI, CT scan, or other procedure, tell your doctor that you are using this medicine (On-Body Injector only). What side effects may I notice from receiving this medicine? Side effects that you should report to your doctor or health care professional as soon as possible: -allergic reactions like skin rash, itching or hives, swelling of the face, lips, or tongue -dizziness -fever -pain, redness, or irritation at   site where injected -pinpoint red spots on the skin -red or dark-brown urine -shortness of breath or breathing problems -stomach or side pain, or pain at the shoulder -swelling -tiredness -trouble passing urine or change in the amount of  urine Side effects that usually do not require medical attention (report to your doctor or health care professional if they continue or are bothersome): -bone pain -muscle pain This list may not describe all possible side effects. Call your doctor for medical advice about side effects. You may report side effects to FDA at 1-800-FDA-1088. Where should I keep my medicine? Keep out of the reach of children. Store pre-filled syringes in a refrigerator between 2 and 8 degrees C (36 and 46 degrees F). Do not freeze. Keep in carton to protect from light. Throw away this medicine if it is left out of the refrigerator for more than 48 hours. Throw away any unused medicine after the expiration date. NOTE: This sheet is a summary. It may not cover all possible information. If you have questions about this medicine, talk to your doctor, pharmacist, or health care provider.  2018 Elsevier/Gold Standard (2016-09-29 12:58:03)  

## 2018-09-17 NOTE — H&P (View-Only) (Signed)
[name] Documented: 09/17/2018 3:04 PM Location: [facility] Patient #: [id number] DOB: 08-13-1966 Single / Language: [name] / Race: Black or African American Female  History of Present Illness Marcello Moores A. 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 She finished her last cycle chemotherapy 3 days ago. Repeat MRI has not been read by 5 reviewed the films myself. There appears to be significant decline in size of the right medial breast lesion. Her hidradenitis has been stable. We discussed options of mastectomy. She has seen plastic surgery but they recommended delayed reconstruction given her hidradenitis. We discussed options of breast conservation versus mastectomy and reconstruction today and the pros and cons of each.  The patient is a 52 year old female.   Problem List/Past Medical Sharyn Lull R. Brooks, CMA; 09/17/2018 3:04 PM) ABSCESS OF BREAST, RIGHT (N61.1) ABSCESS OF AXILLA, LEFT (L02.412) BREAST CANCER, RIGHT (C50.911) HIDRADENITIS (L73.2)  Past Surgical History Sharyn Lull R. Brooks, CMA; 09/17/2018 3:04 PM) Breast Reconstruction Bilateral. Foot Surgery Right. Thyroid Surgery  Diagnostic Studies History Sharyn Lull R. Rolena Infante, CMA; 09/17/2018 3:04 PM) Colonoscopy never Pap Smear never  Allergies Sharyn Lull R. Brooks, CMA; 09/17/2018 3:04 PM) Tape 1"X5yd *MEDICAL DEVICES AND SUPPLIES* clear tape Genvoya *ANTIVIRALS* Lisinopril *ANTIHYPERTENSIVES* Xylocaine (Cardiac) *ANTIARRHYTHMICS* Valsartan *ANTIHYPERTENSIVES*  Medication History (Michelle R. Brooks, CMA; 09/17/2018 3:04 PM) Atorvastatin Calcium ('20MG'$  Tablet, Oral) Active. HydrALAZINE HCl ('25MG'$  Tablet, Oral) Active. Levothyroxine Sodium (175MCG Tablet, Oral) Active. Levothyroxine Sodium (200MCG Tablet, Oral) Active. Losartan Potassium-HCTZ (100-'25MG'$  Tablet, Oral) Active. MetFORMIN HCl  ('500MG'$  Tablet, Oral) Active. Metoprolol Succinate ER ('50MG'$  Tablet ER 24HR, Oral) Active. Mometasone Furoate (50MCG/ACT Suspension, Nasal) Active. Odefsey (200-25-'25MG'$  Tablet, Oral) Active. OneTouch Delica Lancets 25D Active. OneTouch Verio (In Vitro) Active. ProAir RespiClick (664 (90 Base)MCG/ACT Aero Pow Br Act, Inhalation) Active. Medications Reconciled  Social History Sharyn Lull R. Brooks, CMA; 09/17/2018 3:04 PM) Alcohol use Occasional alcohol use. No caffeine use No drug use Tobacco use Current every day smoker.  Family History Sharyn Lull R. Rolena Infante, CMA; 09/17/2018 3:04 PM) Arthritis Family Members In General. Breast Cancer Family Members In [place] Mother. Diabetes Mellitus Family Members In General. Heart Disease Family Members In General. Hypertension Family Members In General, Father, Mother, Sister. Kidney Disease Family Members In General. Thyroid problems Family Members In [place], Son.  Pregnancy / Birth History Sharyn Lull R. Rolena Infante, CMA; 09/17/2018 3:04 PM) Age at menarche 19 years. Age of menopause <47 Contraceptive History Depo-provera. Gravida 1 Irregular periods Maternal age 15-30 Para 1  Other Problems Sharyn Lull R. Brooks, CMA; 09/17/2018 3:04 PM) High blood pressure HIV-positive Kidney Stone Thyroid Disease    Vitals Sharyn Lull R. Brooks CMA; 09/17/2018 3:04 PM) 09/17/2018 3:04 PM Weight: 233 lb Height: 65in Body Surface Area: 2.11 m Body Mass Index: 38.77 kg/m  BP: 122/84 (Sitting, Left Arm, Standard)      Physical Exam (Annalise Mcdiarmid A. Jerek Meulemans MD; 09/17/2018 3:24 PM)  General Mental Status-Alert. General Appearance-Consistent with stated age. Hydration-Well hydrated. Voice-Normal.  Integumentary Note: Bilateral inframammary hidradenitis and bilateral axillary hidradenitis which appears stable today with no active lesion.  Head and Neck Head-normocephalic, atraumatic with no lesions or  palpable masses.  Eye Eyeball - Bilateral-Extraocular movements intact. Sclera/Conjunctiva - Bilateral-No scleral icterus.  Breast Note: Right breast shows no mass lesion. Left breast shows no mass lesion. Breast reduction scars noted.  Cardiovascular Cardiovascular examination reveals -on palpation PMI is normal in location and amplitude, no palpable S3 or S4. Normal cardiac borders.,  normal heart sounds, regular rate and rhythm with no murmurs, carotid auscultation reveals no bruits and normal pedal pulses bilaterally.  Abdomen Inspection Inspection of the abdomen reveals - No Hernias. Skin - Scar - no surgical scars. Palpation/Percussion Palpation and Percussion of the abdomen reveal - Soft, Non Tender, No Rebound tenderness, No Rigidity (guarding) and No hepatosplenomegaly. Auscultation Auscultation of the abdomen reveals - Bowel sounds normal.  Neurologic Neurologic evaluation reveals -alert and oriented x 3 with no impairment of recent or remote memory. Mental Status-Normal.  Musculoskeletal Normal Exam - Left-Upper Extremity Strength Normal and Lower Extremity Strength Normal. Normal Exam - Right-Upper Extremity Strength Normal, Lower Extremity Weakness.    Assessment & Plan (Shunta Mclaurin A. Lovella Hardie MD; 09/17/2018 3:21 PM)  BREAST CANCER, RIGHT (C50.911) Impression: Good response to chemotherapy. Discussed options of mastectomy with reconstruction versus breast conservation. She is seeing plastic surgery due to her hidradenitis they do not recommend immediate reconstruction. After discussion of all options and the pros and cons of each and long-term expectations possible treatments required, she is opted for right breast C localized lumpectomy with right axillary sentinel lymph node mapping. She may not map well due to her previous breast reduction in 2 dyes were used. If that fails she may require lymph node dissection and she is aware of the above. Risk of lumpectomy  include bleeding, infection, seroma, more surgery, use of seed/wire, wound care, cosmetic deformity and the need for other treatments, death , blood clots, death. Pt agrees to proceed. Risk of sentinel lymph node mapping include bleeding, infection, lymphedema, shoulder pain. stiffness, dye allergy. cosmetic deformity , blood clots, death, need for more surgery. Pt agres to proceed.  Current Plans You are being scheduled for surgery- Our schedulers will call you.  You should hear from our office's scheduling department within 5 working days about the location, date, and time of surgery. We try to make accommodations for patient's preferences in scheduling surgery, but sometimes the OR schedule or the surgeon's schedule prevents [place] from making those accommodations.  If you have not heard from our office [phone]) in 5 working days, call the office and ask for your surgeon's nurse.  If you have other questions about your diagnosis, plan, or surgery, call the office and ask for your surgeon's nurse.  We discussed the staging and pathophysiology of breast cancer. We discussed all of the different options for treatment for breast cancer including surgery, chemotherapy, radiation therapy, Herceptin, and antiestrogen therapy. We discussed a sentinel lymph node biopsy as she does not appear to having lymph node involvement right now. We discussed the performance of that with injection of radioactive tracer and blue dye. We discussed that she would have an incision underneath her axillary hairline. We discussed that there is a bout a 10-20% chance of having a positive node with a sentinel lymph node biopsy and we will await the permanent pathology to make any other first further decisions in terms of her treatment. One of these options might be to return to the operating room to perform an axillary lymph node dissection. We discussed about a 1-2% risk lifetime of chronic shoulder pain as well as  lymphedema associated with a sentinel lymph node biopsy. We discussed the options for treatment of the breast cancer which included lumpectomy versus a mastectomy. We discussed the performance of the lumpectomy with a wire placement. We discussed a 10-20% chance of a positive margin requiring reexcision in the operating room. We also discussed that she may need radiation  therapy or antiestrogen therapy or both if she undergoes lumpectomy. We discussed the mastectomy and the postoperative care for that as well. We discussed that there is no difference in her survival whether she undergoes lumpectomy with radiation therapy or antiestrogen therapy versus a mastectomy. There is a slight difference in the local recurrence rate being 3-5% with lumpectomy and about 1% with a mastectomy. We discussed the risks of operation including bleeding, infection, possible reoperation. She understands her further therapy will be based on what her stages at the time of her operation.  Pt Education - [facility] breast cancer surgery: discussed with patient and provided information. Pt Education - ABC (After Breast Cancer) Class Info: discussed with patient and provided information. Pt Education - [facility] Breast Pains Education

## 2018-09-18 ENCOUNTER — Ambulatory Visit: Payer: Self-pay | Admitting: Internal Medicine

## 2018-09-18 ENCOUNTER — Encounter: Payer: Self-pay | Admitting: Gastroenterology

## 2018-09-18 ENCOUNTER — Ambulatory Visit: Payer: Self-pay | Admitting: Surgery

## 2018-09-18 DIAGNOSIS — C50911 Malignant neoplasm of unspecified site of right female breast: Secondary | ICD-10-CM

## 2018-09-19 ENCOUNTER — Inpatient Hospital Stay: Payer: Managed Care, Other (non HMO)

## 2018-09-19 ENCOUNTER — Telehealth: Payer: Self-pay | Admitting: Adult Health

## 2018-09-19 ENCOUNTER — Encounter: Payer: Self-pay | Admitting: Adult Health

## 2018-09-19 ENCOUNTER — Telehealth: Payer: Self-pay | Admitting: Oncology

## 2018-09-19 ENCOUNTER — Inpatient Hospital Stay (HOSPITAL_BASED_OUTPATIENT_CLINIC_OR_DEPARTMENT_OTHER): Payer: Managed Care, Other (non HMO) | Admitting: Adult Health

## 2018-09-19 ENCOUNTER — Ambulatory Visit (HOSPITAL_COMMUNITY)
Admission: RE | Admit: 2018-09-19 | Discharge: 2018-09-19 | Disposition: A | Discharge status: Home / Self Care | Payer: Managed Care, Other (non HMO) | Source: Ambulatory Visit | Attending: Adult Health | Admitting: Adult Health

## 2018-09-19 ENCOUNTER — Other Ambulatory Visit: Payer: Self-pay | Admitting: Surgery

## 2018-09-19 VITALS — BP 148/96 | HR 76 | Temp 98.3°F | Resp 18 | Ht 65.0 in | Wt 232.5 lb

## 2018-09-19 DIAGNOSIS — C73 Malignant neoplasm of thyroid gland: Secondary | ICD-10-CM

## 2018-09-19 DIAGNOSIS — C50211 Malignant neoplasm of upper-inner quadrant of right female breast: Secondary | ICD-10-CM

## 2018-09-19 DIAGNOSIS — L732 Hidradenitis suppurativa: Secondary | ICD-10-CM | POA: Diagnosis not present

## 2018-09-19 DIAGNOSIS — Z5111 Encounter for antineoplastic chemotherapy: Secondary | ICD-10-CM | POA: Diagnosis not present

## 2018-09-19 DIAGNOSIS — Z170 Estrogen receptor positive status [ER+]: Secondary | ICD-10-CM

## 2018-09-19 DIAGNOSIS — Z21 Asymptomatic human immunodeficiency virus [HIV] infection status: Secondary | ICD-10-CM | POA: Diagnosis not present

## 2018-09-19 DIAGNOSIS — R110 Nausea: Secondary | ICD-10-CM

## 2018-09-19 DIAGNOSIS — L0292 Furuncle, unspecified: Secondary | ICD-10-CM

## 2018-09-19 DIAGNOSIS — C50311 Malignant neoplasm of lower-inner quadrant of right female breast: Secondary | ICD-10-CM

## 2018-09-19 DIAGNOSIS — C50911 Malignant neoplasm of unspecified site of right female breast: Secondary | ICD-10-CM

## 2018-09-19 DIAGNOSIS — Z95828 Presence of other vascular implants and grafts: Secondary | ICD-10-CM

## 2018-09-19 DIAGNOSIS — Z79811 Long term (current) use of aromatase inhibitors: Secondary | ICD-10-CM

## 2018-09-19 DIAGNOSIS — E860 Dehydration: Secondary | ICD-10-CM

## 2018-09-19 LAB — CBC WITH DIFFERENTIAL/PLATELET
Abs Immature Granulocytes: 0.3 10*3/uL — ABNORMAL HIGH (ref 0.00–0.07)
Band Neutrophils: 7 %
Basophils Absolute: 0 10*3/uL (ref 0.0–0.1)
Basophils Relative: 0 %
EOS PCT: 0 %
Eosinophils Absolute: 0 10*3/uL (ref 0.0–0.5)
HEMATOCRIT: 28.9 % — AB (ref 36.0–46.0)
Hemoglobin: 9.3 g/dL — ABNORMAL LOW (ref 12.0–15.0)
Lymphocytes Relative: 7 %
Lymphs Abs: 2 10*3/uL (ref 0.7–4.0)
MCH: 31.1 pg (ref 26.0–34.0)
MCHC: 32.2 g/dL (ref 30.0–36.0)
MCV: 96.7 fL (ref 80.0–100.0)
MONO ABS: 0 10*3/uL — AB (ref 0.1–1.0)
Metamyelocytes Relative: 1 %
Monocytes Relative: 0 %
Neutro Abs: 26.9 10*3/uL — ABNORMAL HIGH (ref 1.7–17.7)
Neutrophils Relative %: 85 %
Platelets: 305 10*3/uL (ref 150–400)
RBC: 2.99 MIL/uL — ABNORMAL LOW (ref 3.87–5.11)
RDW: 21.2 % — ABNORMAL HIGH (ref 11.5–15.5)
WBC: 29.2 10*3/uL — ABNORMAL HIGH (ref 4.0–10.5)
nRBC: 0 % (ref 0.0–0.2)

## 2018-09-19 LAB — COMPREHENSIVE METABOLIC PANEL
ALT: 18 U/L (ref 0–44)
AST: 18 U/L (ref 15–41)
Albumin: 3.4 g/dL — ABNORMAL LOW (ref 3.5–5.0)
Alkaline Phosphatase: 142 U/L — ABNORMAL HIGH (ref 38–126)
Anion gap: 11 (ref 5–15)
BUN: 14 mg/dL (ref 6–20)
CO2: 25 mmol/L (ref 22–32)
Calcium: 9.2 mg/dL (ref 8.9–10.3)
Chloride: 103 mmol/L (ref 98–111)
Creatinine, Ser: 1.44 mg/dL — ABNORMAL HIGH (ref 0.44–1.00)
GFR calc Af Amer: 49 mL/min — ABNORMAL LOW (ref >60)
GFR calc non Af Amer: 42 mL/min — ABNORMAL LOW (ref >60)
Glucose, Bld: 202 mg/dL — ABNORMAL HIGH (ref 70–99)
Potassium: 3.3 mmol/L — ABNORMAL LOW (ref 3.5–5.1)
Sodium: 139 mmol/L (ref 135–145)
TOTAL PROTEIN: 8.2 g/dL — AB (ref 6.5–8.1)

## 2018-09-19 MED ORDER — SODIUM CHLORIDE 0.9 % IV SOLN: Freq: Once | INTRAVENOUS | Status: DC

## 2018-09-19 MED ORDER — SULFAMETHOXAZOLE-TRIMETHOPRIM 800-160 MG PO TABS: 1.0000 | ORAL_TABLET | Freq: Two times a day (BID) | ORAL | 0 refills | Status: DC

## 2018-09-19 MED ORDER — SODIUM CHLORIDE 0.9% FLUSH
10.0000 mL | INTRAVENOUS | Status: DC | PRN
  Administered 2018-09-19: 10 mL
  Filled 2018-09-19: qty 10

## 2018-09-19 MED ORDER — ONDANSETRON HCL 4 MG/2ML IJ SOLN
8.0000 mg | Freq: Once | INTRAMUSCULAR | Status: AC
  Administered 2018-09-19: 8 mg via INTRAVENOUS

## 2018-09-19 MED ORDER — SODIUM CHLORIDE 0.9 % IV SOLN
INTRAVENOUS | Status: DC
  Administered 2018-09-19: 14:00:00 via INTRAVENOUS
  Filled 2018-09-19 (×2): qty 250

## 2018-09-19 MED ORDER — HEPARIN SOD (PORK) LOCK FLUSH 100 UNIT/ML IV SOLN
500.0000 [IU] | Freq: Once | INTRAVENOUS | Status: AC | PRN
  Administered 2018-09-19: 500 [IU]
  Filled 2018-09-19: qty 5

## 2018-09-19 MED ORDER — ONDANSETRON HCL 4 MG/2ML IJ SOLN
INTRAMUSCULAR | Status: AC
  Filled 2018-09-19: qty 4

## 2018-09-19 NOTE — Telephone Encounter (Signed)
Printed patient first treatment appointment.

## 2018-09-19 NOTE — Patient Instructions (Signed)
Rehydration, Adult Rehydration is the replacement of body fluids and salts and minerals (electrolytes) that are lost during dehydration. Dehydration is when there is not enough fluid or water in the body. This happens when you lose more fluids than you take in. Common causes of dehydration include:  Vomiting.  Diarrhea.  Excessive sweating, such as from heat exposure or exercise.  Taking medicines that cause the body to lose excess fluid (diuretics).  Impaired kidney function.  Not drinking enough fluid.  Certain illnesses or infections.  Certain poorly controlled long-term (chronic) illnesses, such as diabetes, heart disease, and kidney disease.  Symptoms of mild dehydration may include thirst, dry lips and mouth, dry skin, and dizziness. Symptoms of severe dehydration may include increased heart rate, confusion, fainting, and not urinating. You can rehydrate by drinking certain fluids or getting fluids through an IV tube, as told by your health care provider. What are the risks? Generally, rehydration is safe. However, one problem that can happen is taking in too much fluid (overhydration). This is rare. If overhydration happens, it can cause an electrolyte imbalance, kidney failure, or a decrease in salt (sodium) levels in the body. How to rehydrate Follow instructions from your health care provider for rehydration. The kind of fluid you should drink and the amount you should drink depend on your condition.  If directed by your health care provider, drink an oral rehydration solution (ORS). This is a drink designed to treat dehydration that is found in pharmacies and retail stores. ? Make an ORS by following instructions on the package. ? Start by drinking small amounts, about  cup (120 mL) every 5-10 minutes. ? Slowly increase how much you drink until you have taken the amount recommended by your health care provider.  Drink enough clear fluids to keep your urine clear or pale  yellow. If you were instructed to drink an ORS, finish the ORS first, then start slowly drinking other clear fluids. Drink fluids such as: ? Water. Do not drink only water. Doing that can lead to having too little sodium in your body (hyponatremia). ? Ice chips. ? Fruit juice that you have added water to (diluted juice). ? Low-calorie sports drinks.  If you are severely dehydrated, your health care provider may recommend that you receive fluids through an IV tube in the hospital.  Do not take sodium tablets. Doing that can lead to the condition of having too much sodium in your body (hypernatremia).  Eating while you rehydrate Follow instructions from your health care provider about what to eat while you rehydrate. Your health care provider may recommend that you slowly begin eating regular foods in small amounts.  Eat foods that contain a healthy balance of electrolytes, such as bananas, oranges, potatoes, tomatoes, and spinach.  Avoid foods that are greasy or contain a lot of fat or sugar.  In some cases, you may get nutrition through a feeding tube that is passed through your nose and into your stomach (nasogastric tube, or NG tube). This may be done if you have uncontrolled vomiting or diarrhea. Beverages to avoid Certain beverages may make dehydration worse. While you rehydrate, avoid:  Alcohol.  Caffeine.  Drinks that contain a lot of sugar. These include: ? High-calorie sports drinks. ? Fruit juice that is not diluted. ? Soda.  Check nutrition labels to see how much sugar or caffeine a beverage contains. Signs of dehydration recovery You may be recovering from dehydration if:  You are urinating more often than before you   started rehydrating.  Your urine is clear or pale yellow.  Your energy level improves.  You vomit less frequently.  You have diarrhea less frequently.  Your appetite improves or returns to normal.  You feel less dizzy or less light-headed.  Your  skin tone and color start to look more normal.  Contact a health care provider if:  You continue to have symptoms of mild dehydration, such as: ? Thirst. ? Dry lips. ? Slightly dry mouth. ? Dry, warm skin. ? Dizziness.  You continue to vomit or have diarrhea. Get help right away if:  You have symptoms of dehydration that get worse.  You feel: ? Confused. ? Weak. ? Like you are going to faint.  You have not urinated in 6-8 hours.  You have very dark urine.  You have trouble breathing.  Your heart rate while sitting still is over 100 beats a minute.  You cannot drink fluids without vomiting.  You have vomiting or diarrhea that: ? Gets worse. ? Does not go away.  You have a fever. This information is not intended to replace advice given to you by your health care provider. Make sure you discuss any questions you have with your health care provider. Document Released: 12/26/2011 Document Revised: 04/22/2016 Document Reviewed: 11/27/2015 [employer] Interactive Patient Education  2018 [employer]  

## 2018-09-19 NOTE — Progress Notes (Signed)
[place]  [phone] [phone]     ID: [name] DOB: 16-Dec-1965  MR#: [id number]  ELF#:[id number]  Patient Care Team: [name], NP as PCP - General (Internal Medicine) Michel Bickers, MD as PCP - Infectious Diseases (Infectious Diseases) Magrinat, Virgie Dad, MD as Consulting Physician (Oncology) Kyung Rudd, MD as Consulting Physician (Radiation Oncology) OTHER MD:   CHIEF COMPLAINT: Triple positive breast cancer  CURRENT TREATMENT: Neoadjuvant chemotherapy  INTERVAL HISTORY: [name] returns today for follow up and treatment of her triple positive breast cancer, accompanied by her daughter. She receives docetaxel, carboplatin, trastuzumab, and pertuzumab every 21 days, with today being day 6 cycle 6. She also receives [place] on day 3 of every cycle. Pertuzumab held after cycle 1 due to diarrhea.  Docetaxel replaced with Gemcitabine starting with cycle 5 due to peripheral neuropathy.    REVIEW OF SYSTEMS: [name] is feeling poorly today. She notes fevers Saturday and Sunday.  Her max fever was 102.  She has been afebrile since, however did not call us.  She also canceled her appt with Dr. [name] yesterday because she felt so poorly.  She has cough, productive of green sputum.  She has fatigue and achiness.  She does note some nasal drainage.  Denies further fevers, shortness of breath, chest pain, or any other concerns.  A detailed ROS was otherwise non contributory.     HISTORY OF CURRENT ILLNESS: From the original intake note:  Felicia GABLE (pronounced "Huff") palpated a mass in the right breast for about 2 weeks before bringing it to medical attention. She underwent bilateral diagnostic mammography with tomography and right breast ultrasonography at [facility] on 04/20/2018 showing: breast density category B. There was a highly suspicious mass located at 3 o'clock in the upper inner quadrant measuring 2.9 x 2.5 x 1.6 cm  and 4 cm from the nipple. Ultrasound of the right axilla showed 5 lymph nodes with abnormal cortical thickening. The left axilla showed 3 lymph nodes with abnormal cortical thickening.  Accordingly on 04/26/2018 she proceeded to biopsy of the right breast area in question. The pathology from this procedure showed ([id number]): Invasive ductal carcinoma, grade II with calcifications. One right axillary lymph node and one left axillary lymph node were negative for carcinoma. Prognostic indicators significant for: estrogen receptor, 100% positive and progesterone receptor, 80% positive, both with strong staining intensity. Proliferation marker Ki67 at 70%. HER2 amplified with ratios HER2/CEP17 signals 4.52 and average HER2 copies per cell 10.85  The patient's subsequent history is as detailed below.  PAST MEDICAL HISTORY: Past Medical History:  Diagnosis Date  . Anemia    Normocytic  . Anxiety   . Asthma   . Blood dyscrasia   . Bronchitis 2005  . 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 Diabetes mellitus without complication ([place])   . Family history of breast cancer   . Family history of lung cancer   . Family history of prostate cancer   . Gastroenteritis 07/10/07  . GERD 07/24/2006  . GRAVE'S DISEASE 01/01/2008  . History of hidradenitis suppurativa   . History of kidney stones   . History of thrush   . HIV DISEASE 07/24/2006   dx March 05  . HYPERTENSION 07/24/2006  . Hyperthyroidism 08/2006   Grave's Disease -diffuse radiotracer uptake 08/25/06 Thyroid scan-Cold nodule to R lower lobe of thyrorid  . Menometrorrhagia    hx of  . Nephrolithiasis   . 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 Pneumonia 2005  . 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 Sarcoidosis 02/08/2007   dx as a teenager in [place] from abnl CXR. Completed 2 yrs Prednisone after lung bx confirmation. No symptoms since then.  . Thyroid cancer ([place])     . THYROID NODULE, RIGHT 02/08/2007  -2018 thyroid nodules were precancerous but pathology was negative for thyroid cancer.   PAST SURGICAL HISTORY: Past Surgical History:  Procedure Laterality Date  . BREAST SURGERY  1997   Breast Reduction   . CYSTOSCOPY W/ URETERAL STENT REMOVAL  11/09/2012   Procedure: CYSTOSCOPY WITH STENT REMOVAL;  Surgeon: Alexis Frock, MD;  Location: [facility];  Service: Urology;  Laterality: Right;  . CYSTOSCOPY WITH RETROGRADE PYELOGRAM, URETEROSCOPY AND STENT PLACEMENT  11/09/2012   Procedure: CYSTOSCOPY WITH RETROGRADE PYELOGRAM, URETEROSCOPY AND STENT PLACEMENT;  Surgeon: Alexis Frock, MD;  [facility];  Service: Urology;  Laterality: Left;  LEFT URETEROSCOPY, STONE MANIPULATION, left STENT exchange   . CYSTOSCOPY WITH STENT PLACEMENT  10/02/2012   Procedure: CYSTOSCOPY WITH STENT PLACEMENT;  Surgeon: Alexis Frock, MD;  Location: [facility];  Service: Urology;  Laterality: Left;  . DILATION AND CURETTAGE OF UTERUS  Feb 2004   s/p for 1st trimester nonviable pregnancy  . EYE SURGERY     sty under eyelid  . INCISE AND DRAIN ABCESS  Nov 03   s/p I &D for righ inframmary fold hidradenitis  . INCISION AND DRAINAGE PERITONSILLAR ABSCESS  Mar 03  . IR CV LINE INJECTION  06/07/2018  . IR IMAGING GUIDED PORT INSERTION  06/20/2018  . IR REMOVAL TUN ACCESS W/ PORT W/O FL MOD SED  06/20/2018  . IRRIGATION AND DEBRIDEMENT ABSCESS  01/31/2012   Procedure: IRRIGATION AND DEBRIDEMENT ABSCESS;  Surgeon: Shann Medal, MD;  [facility]: [facility];  Service: General;  Laterality: Right;  right breast and axilla   . NEPHROLITHOTOMY  10/02/2012   Procedure: NEPHROLITHOTOMY PERCUTANEOUS;  Surgeon: Alexis Frock, MD;  [facility];  Service: Urology;  Laterality: Right;  First Stage Percutaneous Nephrolithotomy with Surgeon Access, Left Ureteral Stent    . NEPHROLITHOTOMY  10/04/2012   Procedure: NEPHROLITHOTOMY PERCUTANEOUS SECOND LOOK;  Surgeon: Alexis Frock, MD;  [facility];  Service: Urology;  Laterality: Right;     . NEPHROLITHOTOMY  10/08/2012   Procedure: NEPHROLITHOTOMY PERCUTANEOUS;  Surgeon: Alexis Frock, MD;  [facility];  Service: Urology;  Laterality: Right;  THIRD STAGE, nephrostomy tube exchange x 2  . NEPHROLITHOTOMY  10/11/2012   Procedure: NEPHROLITHOTOMY PERCUTANEOUS SECOND LOOK;  Surgeon: Alexis Frock, MD;  [facility];  Service: Urology;  Laterality: Right;  RIGHT 4 STAGE PERCUTANOUS NEPHROLITHOTOMY, right URETEROSCOPY WITH HOLMIUM LASER   . PORTACATH PLACEMENT Left 05/17/2018   Procedure: INSERTION PORT-A-CATH;  Surgeon: Coralie Keens, MD;  Location: [facility];  Service: General;  Laterality: Left;  . RADICAL NECK DISSECTION  04/12/2017   limited/notes 04/12/2017  . RADICAL NECK DISSECTION N/A 04/12/2017   Procedure: RADICAL NECK DISSECTION;  Surgeon: Melida Quitter, MD;  Location: [facility];  Service: ENT;  Laterality: N/A;  limited neck dissection 2 hours total  . REDUCTION MAMMAPLASTY Bilateral 1998  . [place]  1994  . THYROIDECTOMY  04/12/2017   completion/notes 04/12/2017  . THYROIDECTOMY N/A 04/12/2017   Procedure: THYROIDECTOMY;  Surgeon: Melida Quitter, MD;  Location: [facility];  Service: ENT;  Laterality: N/A;  Completion Thyroidectomy  . TOTAL THYROIDECTOMY  2010  Thyroid nodules removed- pathology at the  ENT doctor 2018 were benign -Over active thyroid and thyroidectomy with benign pathology  FAMILY HISTORY Family History  Problem Relation Age of Onset  . Hypertension Mother   . Cancer Mother        laryngeal  . Heart disease Mother        stent  . Hypertension Father   . Lung cancer Father 52       hx smoking  . Heart disease Other   . Hypertension Other   . Stroke Other        Grandparent  . Kidney disease Other        Grandparent  . Diabetes Other        FH of Diabetes  . Hypertension Sister   . Cancer Maternal Uncle        Lung CA  . Hypertension Brother   . Hypertension Sister   .  Breast cancer Maternal Aunt 65  . Breast cancer Paternal Aunt 30  . Prostate cancer Paternal Uncle   . Breast cancer Maternal Aunt        dx 60+  . Breast cancer Paternal Aunt        dx 76's  . Breast cancer Paternal Aunt        dx 22's  . Prostate cancer Paternal Uncle   . Lung cancer Paternal Uncle   . Breast cancer Cousin 73  . Breast cancer Cousin        dx <50  . Breast cancer Cousin        dx <50  . Breast cancer Cousin        dx <50  The patient's mother is alive at age 83. The patient's father died at 31 from lung cancer (heavy smoker). She does not know much about her father. The patient has 1 brother and 2 sisters. The patient's mother had a history of laryngeal cancer. There was a maternal cousin with breast cancer diagnosed at age 24. There were 2 maternal aunts with breast cancer, the youngest being diagnosed in the 62's. There was a maternal great aunt with breast cancer. There were 2 paternal aunts with breast cancer.    GYNECOLOGIC HISTORY:  Patient's last menstrual period was 03/31/2014. Menarche: 52 years old Age at first live birth: 52 years old She is GX P1.  Her LMP was in 2017. She did not use HRT.    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 At home is the patient's daughter, Verdie Drown, age 56, who is a Engineer, technical sales education and working part-time at the [employer]. The patient attends [employer].     ADVANCED DIRECTIVES: Not in place; at the 05/10/2018 visit the patient was given the appropriate documents to complete and notarized at her discretion   HEALTH MAINTENANCE: Social History   Tobacco Use  . Smoking status: Former Smoker    Packs/day: 0.50    Years: 15.00    Pack years: 7.50    Types: Cigarettes    Start date: 04/12/2017  . Smokeless tobacco: Never Used  Substance Use Topics  . Alcohol use: Yes    Alcohol/week: 0.0 standard drinks    Comment: social  . Drug use: No     Colonoscopy:  no  PAP: [facility]. 2018  Bone density: remote   Allergies  Allergen Reactions  . Tape Rash    Rash   . Genvoya [Elviteg-Cobic-Emtricit-Tenofaf] Hives  . Lisinopril Cough  . Xylocaine [Lidocaine]  Unknown , patient states she has had lidocaine for IV starts and at the dentist with no problems  . Valsartan Cough    Pt states she tolerates medicine now    Current Outpatient Medications  Medication Sig Dispense Refill  . amLODipine (NORVASC) 10 MG tablet Take 1 tablet (10 mg total) by mouth daily. For blood pressure. 90 tablet 3  . atorvastatin (LIPITOR) 20 MG tablet Take 1 tablet by mouth every evening for high cholesterol. NEED APPOINTMENT FOR ANY MORE REFILLS 30 tablet 0  . dexamethasone (DECADRON) 4 MG tablet Take 2 tablets (8 mg total) by mouth 2 (two) times daily. Start the day before Taxotere. Take once the day after, then 2 times a day x 2d. 30 tablet 1  . dolutegravir ([name]) 50 MG tablet Take 1 tablet (50 mg total) by mouth daily. 30 tablet 11  . doxycycline (VIBRA-TABS) 100 MG tablet Take 1 tablet (100 mg total) by mouth daily. 90 tablet 2  . emtricitabine-tenofovir AF (DESCOVY) 200-25 MG tablet Take 1 tablet by mouth daily. 30 tablet 11  . irbesartan-hydrochlorothiazide (AVALIDE) 150-12.5 MG tablet TAKE 1 TABLET BY MOUTH ONCE DAILY FOR BLOOD PRESSURE. 90 tablet 1  . levothyroxine (SYNTHROID, LEVOTHROID) 175 MCG tablet TAKE 1 TABLET BY MOUTH EVERY MORNING ON AN EMPTY STOMACH WITH A FULL GLASS OF WATER. 30 tablet 5  . lidocaine-prilocaine (EMLA) cream Apply to affected area once 30 g 3  . LORazepam (ATIVAN) 0.5 MG tablet TAKE 1 TABLET (0.5 MG TOTAL) BY MOUTH AT BEDTIME AS NEEDED (NAUSEA OR VOMITING). 20 tablet 0  . metFORMIN (GLUCOPHAGE) 500 MG tablet Take 1 tablet (500 mg total) by mouth 2 (two) times daily with a meal. 180 tablet 3  . metoprolol succinate (TOPROL-XL) 50 MG 24 hr tablet TAKE 1 TABLET (50 MG TOTAL) BY MOUTH DAILY. TAKE WITH OR IMMEDIATELY  FOLLOWING A MEAL. 90 tablet 1  . mometasone (NASONEX) 50 MCG/ACT nasal spray PLACE 2 SPRAYS INTO THE NOSE DAILY. 17 g 3  . Multiple Vitamin (MULTIVITAMIN) tablet Take 1 tablet by mouth daily.    . ONE TOUCH LANCETS MISC Use as instructed to test blood sugar daily 200 each 5  . ONETOUCH VERIO test strip USE AS INSTRUCTED TO TEST BLOOD SUGAR DAILY NEED APPOINTMENT FOR ANY MORE REFILLS 50 each 0  . oxyCODONE (OXY IR/ROXICODONE) 5 MG immediate release tablet Take 1 tablet (5 mg total) by mouth every 6 (six) hours as needed for moderate pain or severe pain. 20 tablet 0  . pantoprazole (PROTONIX) 40 MG tablet Take 40 mg by mouth daily as needed (for indigestion/acid reflux.).     Marland Kitchen potassium chloride (K-DUR) 10 MEQ tablet Take 2 tablets (20 mEq total) by mouth 2 (two) times daily. 20 tablet 0  . PROAIR RESPICLICK 161 (90 Base) MCG/ACT AEPB INHALE 2 PUFFS INTO THE LUNGS EVERY 6 HOURS AS NEEDED FOR WHEEZING OR SHORTNESS OF BREATH. 1 each 2  . prochlorperazine (COMPAZINE) 10 MG tablet TAKE 1 TABLET BY MOUTH EVERY 6 HOURS AS NEEDED FOR NAUSEA OR VOMITING 30 tablet 2  . traMADol (ULTRAM) 50 MG tablet Take 1 tablet (50 mg total) by mouth every 6 (six) hours as needed. 30 tablet 0   No current facility-administered medications for this visit.    Facility-Administered Medications Ordered in Other Visits  Medication Dose Route Frequency Provider Last Rate Last Dose  . heparin lock flush 100 unit/mL  500 Units Intracatheter Once PRN Magrinat, Virgie Dad, MD      .  sodium chloride flush (NS) 0.9 % injection 10 mL  10 mL Intracatheter PRN Magrinat, Virgie Dad, MD        OBJECTIVE:  Vitals:   09/19/18 1111  BP: (!) 148/96  Pulse: 76  Resp: 18  Temp: 98.3 F (36.8 C)  SpO2: 98%     Body mass index is 38.69 kg/m.   Wt Readings from Last 3 Encounters:  09/19/18 232 lb 8 oz (105.5 kg)  09/10/18 233 lb (105.7 kg)  08/24/18 235 lb 8 oz (106.8 kg)  ECOG FS:1 - Symptomatic but completely ambulatory GENERAL:  Patient is a tired appearing woman wearing surgical mask HEENT:  Sclerae anicteric.  Oropharynx clear and moist. No ulcerations or evidence of oropharyngeal candidiasis. Neck is supple.  NODES:  No cervical, supraclavicular, or axillary lymphadenopathy palpated.  BREAST EXAM:  deferred LUNGS:  Clear to auscultation bilaterally.  No wheezes or rhonchi. HEART:  Regular rate and rhythm. No murmur appreciated. ABDOMEN:  Soft, nontender.  Positive, normoactive bowel sounds. No organomegaly palpated. MSK:  No focal spinal tenderness to palpation. Full range of motion bilaterally in the upper extremities. EXTREMITIES:  No peripheral edema.   SKIN:  Clear with no obvious rashes or skin changes. No nail dyscrasia. Dressing over boil in right lower abdominal fold NEURO:  Nonfocal. Well oriented.  Appropriate affect.       LAB RESULTS:  CMP     Component Value Date/Time   NA 139 09/19/2018 1029   K 3.3 (L) 09/19/2018 1029   CL 103 09/19/2018 1029   CO2 25 09/19/2018 1029   GLUCOSE 202 (H) 09/19/2018 1029   BUN 14 09/19/2018 1029   CREATININE 1.44 (H) 09/19/2018 1029   CREATININE 1.28 (H) 05/10/2018 1458   CREATININE 1.02 09/23/2015 1047   CALCIUM 9.2 09/19/2018 1029   PROT 8.2 (H) 09/19/2018 1029   ALBUMIN 3.4 (L) 09/19/2018 1029   AST 18 09/19/2018 1029   AST 28 05/10/2018 1458   ALT 18 09/19/2018 1029   ALT 23 05/10/2018 1458   ALKPHOS 142 (H) 09/19/2018 1029   BILITOT <0.2 (L) 09/19/2018 1029   BILITOT <0.2 (L) 05/10/2018 1458   GFRNONAA 42 (L) 09/19/2018 1029   GFRNONAA 48 (L) 05/10/2018 1458   GFRAA 49 (L) 09/19/2018 1029   GFRAA 56 (L) 05/10/2018 1458    No results found for: TOTALPROTELP, ALBUMINELP, A1GS, A2GS, BETS, BETA2SER, GAMS, MSPIKE, SPEI  No results found for: KPAFRELGTCHN, LAMBDASER, KAPLAMBRATIO  Lab Results  Component Value Date   WBC 29.2 (H) 09/19/2018   NEUTROABS 26.9 (H) 09/19/2018   HGB 9.3 (L) 09/19/2018   HCT 28.9 (L) 09/19/2018   MCV 96.7  09/19/2018   PLT 305 09/19/2018    '@LASTCHEMISTRY'$ @  No results found for: LABCA2  No components found for: [id number]  No results for input(s): INR in the last 168 hours.  No results found for: LABCA2  No results found for: YCX448  No results found for: JEH631  No results found for: SHF026  No results found for: CA2729  No components found for: HGQUANT  No results found for: CEA1 / No results found for: CEA1   No results found for: AFPTUMOR  No results found for: CHROMOGRNA  No results found for: PSA1  Appointment on 09/19/2018  Component Date Value Ref Range Status  . WBC 09/19/2018 29.2* 4.0 - 10.5 K/uL Final  . RBC 09/19/2018 2.99* 3.87 - 5.11 MIL/uL Final  . Hemoglobin 09/19/2018 9.3* 12.0 - 15.0 g/dL Final  .  HCT 09/19/2018 28.9* 36.0 - 46.0 % Final  . MCV 09/19/2018 96.7  80.0 - 100.0 fL Final  . MCH 09/19/2018 31.1  26.0 - 34.0 pg Final  . MCHC 09/19/2018 32.2  30.0 - 36.0 g/dL Final  . RDW 09/19/2018 21.2* 11.5 - 15.5 % Final  . Platelets 09/19/2018 305  150 - 400 K/uL Final  . nRBC 09/19/2018 0.0  0.0 - 0.2 % Final  . Neutrophils Relative % 09/19/2018 85  % Final  . Neutro Abs 09/19/2018 26.9* 1.7 - 17.7 K/uL Final  . Band Neutrophils 09/19/2018 7  % Final  . Lymphocytes Relative 09/19/2018 7  % Final  . Lymphs Abs 09/19/2018 2.0  0.7 - 4.0 K/uL Final  . Monocytes Relative 09/19/2018 0  % Final  . Monocytes Absolute 09/19/2018 0.0* 0.1 - 1.0 K/uL Final  . Eosinophils Relative 09/19/2018 0  % Final  . Eosinophils Absolute 09/19/2018 0.0  0.0 - 0.5 K/uL Final  . Basophils Relative 09/19/2018 0  % Final  . Basophils Absolute 09/19/2018 0.0  0.0 - 0.1 K/uL Final  . Metamyelocytes Relative 09/19/2018 1  % Final  . Abs Immature Granulocytes 09/19/2018 0.30* 0.00 - 0.07 K/uL Final   Performed at [facility], [place] [address], [place], [place] [address]  . Sodium 09/19/2018 139  135 - 145 mmol/L Final  . Potassium 09/19/2018 3.3* 3.5  - 5.1 mmol/L Final  . Chloride 09/19/2018 103  98 - 111 mmol/L Final  . CO2 09/19/2018 25  22 - 32 mmol/L Final  . Glucose, Bld 09/19/2018 202* 70 - 99 mg/dL Final  . BUN 09/19/2018 14  6 - 20 mg/dL Final  . Creatinine, Ser 09/19/2018 1.44* 0.44 - 1.00 mg/dL Final  . Calcium 09/19/2018 9.2  8.9 - 10.3 mg/dL Final  . Total Protein 09/19/2018 8.2* 6.5 - 8.1 g/dL Final  . Albumin 09/19/2018 3.4* 3.5 - 5.0 g/dL Final  . AST 09/19/2018 18  15 - 41 U/L Final  . ALT 09/19/2018 18  0 - 44 U/L Final  . Alkaline Phosphatase 09/19/2018 142* 38 - 126 U/L Final  . Total Bilirubin 09/19/2018 <0.2* 0.3 - 1.2 mg/dL Final  . GFR calc non Af Amer 09/19/2018 42* >60 mL/min Final  . GFR calc Af Amer 09/19/2018 49* >60 mL/min Final  . Anion gap 09/19/2018 11  5 - 15 Final   Performed at [facility], [address] [place], [place] [address]    (this displays the last labs from the last 3 days)  No results found for: TOTALPROTELP, ALBUMINELP, A1GS, A2GS, BETS, BETA2SER, GAMS, MSPIKE, SPEI (this displays SPEP labs)  No results found for: KPAFRELGTCHN, LAMBDASER, KAPLAMBRATIO (kappa/lambda light chains)  No results found for: HGBA, HGBA2QUANT, HGBFQUANT, HGBSQUAN (Hemoglobinopathy evaluation)   No results found for: LDH  Lab Results  Component Value Date   IRON 26 (L) 02/08/2007   TIBC 371 02/08/2007   IRONPCTSAT 7 (L) 02/08/2007   (Iron and TIBC)  No results found for: FERRITIN  Urinalysis    Component Value Date/Time   COLORURINE YELLOW 10/21/2014 2347   APPEARANCEUR CLOUDY (A) 10/21/2014 2347   LABSPEC 1.014 10/21/2014 2347   PHURINE 6.0 10/21/2014 2347   GLUCOSEU NEGATIVE 10/21/2014 2347   GLUCOSEU NEG mg/dL 01/23/2008 1418   HGBUR LARGE (A) 10/21/2014 2347   BILIRUBINUR negative 12/29/2017 [place] 10/21/2014 2347   PROTEINUR 2+ 12/29/2017 1046   PROTEINUR 100 (A) 10/21/2014 2347  UROBILINOGEN 0.2 12/29/2017 1046   UROBILINOGEN 0.2  10/21/2014 2347   NITRITE negative 12/29/2017 1046   NITRITE NEGATIVE 10/21/2014 2347   LEUKOCYTESUR Large (3+) (A) 12/29/2017 1046     STUDIES:   ELIGIBLE FOR AVAILABLE RESEARCH PROTOCOL: UPBEAT  ASSESSMENT: 52 y.o. [place], [place] woman status post right breast upper inner quadrant biopsy 04/26/2018 for a clinical T2 N0, stage Ib invasive ductal carcinoma, grade 2, with an MIB-1 of 70%.  (1) genetics testing 07/03/2018  (2) neoadjuvant chemotherapy will consist of carboplatin, docetaxel, trastuzumab and Pertuzumab given every 21 days x 6, starting 05/31/2018  (a) Pertuzumab discontinued starting with cycle 2 because of diarrhea problems  (3) trastuzumab to continue to total 6 months  (a) echo on 05/16/2018 shows an EF of 60-65%.  (b) echo on 08/27/18 shows an EF 55-60%  (4) definitive surgery to follow chemotherapy  (5) adjuvant radiation to follow surgery  (6) antiestrogens to start at the completion of local treatment  (7) hidradenitis/boils: on doxycycline daily  PLAN: Ashonti is not feeling well today.  Her CBC does not show neutropenia.  Considering the fact that she has ran one high fever, and has cough, feels nauseated and dehydrated, recently received chemotherapy, and has h/o HIV we will do the following: 1. Blood cultures x 2 2. Chest xray--shows nodular density over right lower lung, CT chest recommended 3. IV fluids 4. IV Zofran  Reviewed with Dr. [name].  Septra DS 1 tab po bid prescribed.  CT chest ordered, will hopefully be able to be done tomorrow.    Keierra will return after Christmas on 12/27 for labs, evaluation, and her next Herceptin.  She knows to call for any questions or concerns prior to her next appt with [place].    A total of (30) minutes of face-to-face time was spent with this patient with greater than 50% of that time in counseling and care-coordination.   Wilber Bihari, NP 09/19/18

## 2018-09-19 NOTE — Progress Notes (Signed)
Pt informed of Ct scan on dec 11th at 1130, advised to come fifteen minutes early and to only consume fluids for 4 hours beforehand.  Verbalized understanding.

## 2018-09-19 NOTE — Telephone Encounter (Signed)
Spoke with patient re appointments for 12/27.

## 2018-09-21 ENCOUNTER — Other Ambulatory Visit: Payer: Self-pay | Admitting: Surgery

## 2018-09-21 ENCOUNTER — Other Ambulatory Visit: Payer: Self-pay | Admitting: Primary Care

## 2018-09-21 DIAGNOSIS — R9389 Abnormal findings on diagnostic imaging of other specified body structures: Secondary | ICD-10-CM

## 2018-09-24 LAB — CULTURE, BLOOD (SINGLE)
CULTURE: NO GROWTH
Culture: NO GROWTH
Special Requests: ADEQUATE
Special Requests: ADEQUATE

## 2018-09-26 ENCOUNTER — Ambulatory Visit (HOSPITAL_COMMUNITY)
Admission: RE | Admit: 2018-09-26 | Discharge: 2018-09-26 | Disposition: A | Discharge status: Home / Self Care | Payer: Managed Care, Other (non HMO) | Source: Ambulatory Visit | Attending: Adult Health | Admitting: Adult Health

## 2018-09-26 ENCOUNTER — Telehealth: Payer: Self-pay | Admitting: Adult Health

## 2018-09-26 ENCOUNTER — Other Ambulatory Visit: Payer: Self-pay | Admitting: Adult Health

## 2018-09-26 ENCOUNTER — Encounter (HOSPITAL_COMMUNITY): Payer: Self-pay

## 2018-09-26 DIAGNOSIS — C50311 Malignant neoplasm of lower-inner quadrant of right female breast: Secondary | ICD-10-CM

## 2018-09-26 DIAGNOSIS — Z170 Estrogen receptor positive status [ER+]: Secondary | ICD-10-CM | POA: Insufficient documentation

## 2018-09-26 MED ORDER — TRAMADOL HCL 50 MG PO TABS: 50.0000 mg | ORAL_TABLET | Freq: Four times a day (QID) | ORAL | 0 refills | Status: DC | PRN

## 2018-09-26 NOTE — Telephone Encounter (Signed)
Tramadol called in to [facility] for refill, 50 mg 30 tabs 0 refills.

## 2018-09-26 NOTE — Telephone Encounter (Signed)
Thank you.  I reviewed PMP aware and found no red flags.

## 2018-09-27 ENCOUNTER — Ambulatory Visit (INDEPENDENT_AMBULATORY_CARE_PROVIDER_SITE_OTHER): Payer: Managed Care, Other (non HMO) | Admitting: Internal Medicine

## 2018-09-27 ENCOUNTER — Encounter: Payer: Self-pay | Admitting: Internal Medicine

## 2018-09-27 ENCOUNTER — Ambulatory Visit (HOSPITAL_COMMUNITY)
Admission: RE | Admit: 2018-09-27 | Discharge: 2018-09-27 | Disposition: A | Discharge status: Home / Self Care | Payer: Managed Care, Other (non HMO) | Source: Ambulatory Visit | Attending: Adult Health | Admitting: Adult Health

## 2018-09-27 ENCOUNTER — Ambulatory Visit: Payer: Self-pay

## 2018-09-27 ENCOUNTER — Ambulatory Visit (INDEPENDENT_AMBULATORY_CARE_PROVIDER_SITE_OTHER): Payer: Managed Care, Other (non HMO) | Admitting: Licensed Clinical Social Worker

## 2018-09-27 ENCOUNTER — Other Ambulatory Visit: Payer: Self-pay | Admitting: Internal Medicine

## 2018-09-27 ENCOUNTER — Other Ambulatory Visit: Payer: Self-pay | Admitting: Surgery

## 2018-09-27 ENCOUNTER — Ambulatory Visit
Admission: RE | Admit: 2018-09-27 | Discharge: 2018-09-27 | Disposition: A | Discharge status: Home / Self Care | Payer: Managed Care, Other (non HMO) | Source: Ambulatory Visit | Attending: Surgery | Admitting: Surgery

## 2018-09-27 DIAGNOSIS — F4322 Adjustment disorder with anxiety: Secondary | ICD-10-CM | POA: Diagnosis not present

## 2018-09-27 DIAGNOSIS — F330 Major depressive disorder, recurrent, mild: Secondary | ICD-10-CM

## 2018-09-27 DIAGNOSIS — L732 Hidradenitis suppurativa: Secondary | ICD-10-CM

## 2018-09-27 DIAGNOSIS — R9389 Abnormal findings on diagnostic imaging of other specified body structures: Secondary | ICD-10-CM

## 2018-09-27 DIAGNOSIS — C50311 Malignant neoplasm of lower-inner quadrant of right female breast: Secondary | ICD-10-CM | POA: Diagnosis not present

## 2018-09-27 DIAGNOSIS — B20 Human immunodeficiency virus [HIV] disease: Secondary | ICD-10-CM

## 2018-09-27 DIAGNOSIS — Z170 Estrogen receptor positive status [ER+]: Secondary | ICD-10-CM | POA: Diagnosis present

## 2018-09-27 MED ORDER — GADOBUTROL 1 MMOL/ML IV SOLN
10.0000 mL | Freq: Once | INTRAVENOUS | Status: AC | PRN
  Administered 2018-09-27: 10 mL via INTRAVENOUS

## 2018-09-27 MED ORDER — IOHEXOL 300 MG/ML  SOLN
75.0000 mL | Freq: Once | INTRAMUSCULAR | Status: AC | PRN
  Administered 2018-09-27: 75 mL via INTRAVENOUS

## 2018-09-27 NOTE — Pre-Procedure Instructions (Signed)
[name]  09/27/2018      [id number] [address], [place] [address] Phone: [phone] Fax: [phone]  [facility] - [place], [place] [address] [place] [address] Phone: [phone] Fax: [phone]    Your procedure is scheduled on October 03, 2018.  Report to [facility] Admitting at 800 AM.  Call this number if you have problems the morning of surgery:  [phone]   Remember:  Do not eat or drink after midnight.  You may drink clear liquids until 700 AM.  Clear liquids allowed are:           Water, Juice (non-citric and without pulp), Clear Tea, Black Coffee only and Gatorade    Take these medicines the morning of surgery with A SIP OF WATER  Amlodipine (norvasc) Metoprolol succinate (Toprol XL) dexamethason (decadron) dolutegravir ([name]) emtricitabinetenofovir (Descovy) Levothyroxine (synthroid)   7 days prior to surgery STOP taking any Aspirin (unless otherwise instructed by your surgeon), Aleve, Naproxen, Ibuprofen, Motrin, Advil, Goody's, BC's, all herbal medications, fish oil, and all vitamins    WHAT DO I DO ABOUT MY DIABETES MEDICATION?  Marland Kitchen Do not take oral diabetes medicines (pills) the morning of surgery-metformin (glucophage)   Reviewed and Endorsed by [facility] Patient Education Committee, August 2015  How to Manage Your Diabetes Before and After Surgery  Why is it important to control my blood sugar before and after surgery? . Improving blood sugar levels before and after surgery helps healing and can limit problems. . A way of improving blood sugar control is eating a healthy diet by: o  Eating less sugar and carbohydrates o  Increasing activity/exercise o  Talking with your doctor about reaching your blood sugar goals . High blood sugars (greater than 180 mg/dL) can raise your risk of infections and slow your recovery, so  you will need to focus on controlling your diabetes during the weeks before surgery. . Make sure that the doctor who takes care of your diabetes knows about your planned surgery including the date and location.  How do I manage my blood sugar before surgery? . Check your blood sugar at least 4 times a day, starting 2 days before surgery, to make sure that the level is not too high or low. o Check your blood sugar the morning of your surgery when you wake up and every 2 hours until you get to the Short Stay unit. . If your blood sugar is less than 70 mg/dL, you will need to treat for low blood sugar: o Do not take insulin. o Treat a low blood sugar (less than 70 mg/dL) with  cup of clear juice (cranberry or apple), 4 glucose tablets, OR glucose gel. Recheck blood sugar in 15 minutes after treatment (to make sure it is greater than 70 mg/dL). If your blood sugar is not greater than 70 mg/dL on recheck, call [phone] o  for further instructions. . Report your blood sugar to the short stay nurse when you get to Short Stay.  . If you are admitted to the hospital after surgery: o Your blood sugar will be checked by the staff and you will probably be given insulin after surgery (instead of oral diabetes medicines) to make sure you have good blood sugar levels. o The goal for blood sugar control after surgery is 80-180 mg/dL.  [place]- Preparing For Surgery  Before surgery, you can  play an important role. Because skin is not sterile, your skin needs to be as free of germs as possible. You can reduce the number of germs on your skin by washing with CHG (chlorahexidine gluconate) Soap before surgery.  CHG is an antiseptic cleaner which kills germs and bonds with the skin to continue killing germs even after washing.    Oral Hygiene is also important to reduce your risk of infection.  Remember - BRUSH YOUR TEETH THE MORNING OF SURGERY WITH YOUR REGULAR TOOTHPASTE  Please do not use if you have  an allergy to CHG or antibacterial soaps. If your skin becomes reddened/irritated stop using the CHG.  Do not shave (including legs and underarms) for at least 48 hours prior to first CHG shower. It is OK to shave your face.  Please follow these instructions carefully.   1. Shower the NIGHT BEFORE SURGERY and the MORNING OF SURGERY with CHG.   2. If you chose to wash your hair, wash your hair first as usual with your normal shampoo.  3. After you shampoo, rinse your hair and body thoroughly to remove the shampoo.  4. Use CHG as you would any other liquid soap. You can apply CHG directly to the skin and wash gently with a scrungie or a clean washcloth.   5. Apply the CHG Soap to your body ONLY FROM THE NECK DOWN.  Do not use on open wounds or open sores. Avoid contact with your eyes, ears, mouth and genitals (private parts). Wash Face and genitals (private parts)  with your normal soap.  6. Wash thoroughly, paying special attention to the area where your surgery will be performed.  7. Thoroughly rinse your body with warm water from the neck down.  8. DO NOT shower/wash with your normal soap after using and rinsing off the CHG Soap.  9. Pat yourself dry with a CLEAN TOWEL.  10. Wear CLEAN PAJAMAS to bed the night before surgery, wear comfortable clothes the morning of surgery  11. Place CLEAN SHEETS on your bed the night of your first shower and DO NOT SLEEP WITH PETS.  Day of Surgery:  Do not apply any deodorants/lotions.  Please wear clean clothes to the hospital/surgery center.   Remember to brush your teeth WITH YOUR REGULAR TOOTHPASTE.   Do not wear jewelry, make-up or nail polish.  Do not wear lotions, powders, or perfumes, or deodorant.  Do not shave 48 hours prior to surgery.  Men may shave face and neck.  Do not bring valuables to the hospital.  [facility] is not responsible for any belongings or valuables.  Contacts, dentures or bridgework may not be worn into surgery.   Leave your suitcase in the car.  After surgery it may be brought to your room.  For patients admitted to the hospital, discharge time will be determined by your treatment team.  Patients discharged the day of surgery will not be allowed to drive home.    Please read over the following fact sheets that you were given.

## 2018-09-27 NOTE — BH Specialist Note (Signed)
Integrated Behavioral Health Initial Visit  MRN: [id number] Name: [name]  Number of [place] Clinician visits:: 1/6 Session Start time: 2:10pm  Session End time: 2:30pm Total time: 20 minutes  Type of Service: [place] Interpretor:No. Interpretor Name and Language: n/a   Warm Hand Off Completed.       SUBJECTIVE: [name] is a 52 y.o. female accompanied by self Patient was referred by Doctor [name] for depressive symptoms Patient reports the following symptoms/concerns: anxiety, exaggerated startle, worry thoughts Duration of problem: 2 months; Severity of problem: moderate  OBJECTIVE: Mood: Anxious and Affect: Tearful Risk of harm to self or others: No plan to harm self or others  LIFE CONTEXT: Patient was diagnosed with breast cancer and has recently finished chemotherapy. She will be undergoing a lumpectomy soon, and has been having several tests to prepare for the surgery. Patient has been working while undergoing chemotherapy and reports that this has been the best choice that she could possibly have made. She will be off work for 2 weeks following the surgery, so that she can recover. Patient reports a lot of support and friends/family who will be there for her before and after her surgery.  GOALS ADDRESSED: Patient will: 1. Demonstrate ability to: Increase healthy adjustment to current life circumstances  INTERVENTIONS: Interventions utilized: Solution-Focused Strategies and Supportive Counseling    ASSESSMENT: Patient currently experiencing anxiety in various situations and worry thoughts about the future. She states that most of her anxiety takes place when she is around lots of people, or when environments are loud/disorganized. While her anxiety episodes are not strictly related to medical procedures, they began after the cancer diagnosis, and have increased as she underwent chemotherapy and  got closer to surgery date. The most consistent diagnosis at this time is Adjustment Disorder with Anxiety.  Counselor guided patient to explore her thought processes when feeling anxious. Patient stated that she does not really have specific thought patterns, but experiences general fear that things will not be okay. This is not characteristic for patient. Counselor explored with patient her sense of being out of control, and normalized the feeling that her body is not working with/for her. Counselor and patient discussed the way that serious illness impacts feelings of autonomy. Counselor educated patient on techniques for dealing with anxiety, including focused breathe and sensory integrated mindfulness. Patient agreed to try these techniques when facing medical procedures, in crowds, or when she cannot rid her mind of anxious/worry thoughts.    Patient may benefit from ongoing counseling to help with adjustment, after surgery/recovery.  PLAN: 1. Patient will scheudle follow-up appointment after her surgery.  Lillie Fragmin, LCSW

## 2018-09-27 NOTE — Assessment & Plan Note (Signed)
Not surprisingly, the doxycycline has not had much positive impact on her hidradenitis.  I will have her stop it now.

## 2018-09-27 NOTE — Progress Notes (Signed)
Patient Active Problem List   Diagnosis Date Noted  . Malignant neoplasm of lower-inner quadrant of right breast of female, estrogen receptor positive ([place]) 05/10/2018    Priority: High  . Human immunodeficiency virus (HIV) disease ([place]) 07/24/2006    Priority: High  . Depression 07/24/2006    Priority: High  . Hidradenitis suppurativa of right >> left axilla 01/30/2012    Priority: Medium  . 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 Cigarette smoker 02/08/2007    Priority: Medium  . Essential hypertension 07/24/2006    Priority: Medium  . Asthma 07/24/2006    Priority: Medium  . Preventative health care 09/10/2018  . Genetic testing 07/25/2018  . Port-A-Cath in place 07/12/2018  . Family history of breast cancer   . Family history of prostate cancer   . Family history of lung cancer   . DOE (dyspnea on exertion) 10/13/2017  . Hyperlipidemia 04/26/2017  . Chronic anxiety 04/11/2017  . Upper airway cough syndrome 11/11/2016  . Morbid (severe) obesity due to excess calories ([place]) 10/14/2016  . Shortness of breath 10/13/2016  . Allergic rhinitis 02/18/2016  . Right shoulder pain 02/18/2016  . Type 2 diabetes mellitus ([place]) 06/23/2015  . Renal calculi 10/29/2014  . Recurrent boils 06/11/2014  . Abscess of breast, right 01/09/2013  . SARCOIDOSIS 02/08/2007  . GERD 07/24/2006    Patient's Medications  New Prescriptions   No medications on file  Previous Medications   AMLODIPINE (NORVASC) 10 MG TABLET    Take 1 tablet (10 mg total) by mouth daily. For blood pressure.   ATORVASTATIN (LIPITOR) 20 MG TABLET    Take 1 tablet by mouth every evening for high cholesterol. NEED APPOINTMENT FOR ANY MORE REFILLS   DEXAMETHASONE (DECADRON) 4 MG TABLET    Take 2 tablets (8 mg total) by mouth 2 (two) times daily. Start the day before Taxotere. Take once the day after, then 2 times a day x 2d.   DOLUTEGRAVIR ([name]) 50 MG TABLET    Take 1 tablet (50 mg  total) by mouth daily.   EMTRICITABINE-TENOFOVIR AF (DESCOVY) 200-25 MG TABLET    Take 1 tablet by mouth daily.   IRBESARTAN-HYDROCHLOROTHIAZIDE (AVALIDE) 150-12.5 MG TABLET    TAKE 1 TABLET BY MOUTH ONCE DAILY FOR BLOOD PRESSURE.   LEVOTHYROXINE (SYNTHROID, LEVOTHROID) 175 MCG TABLET    TAKE 1 TABLET BY MOUTH EVERY MORNING ON AN EMPTY STOMACH WITH A FULL GLASS OF WATER.   LIDOCAINE-PRILOCAINE (EMLA) CREAM    Apply to affected area once   LORAZEPAM (ATIVAN) 0.5 MG TABLET    TAKE 1 TABLET (0.5 MG TOTAL) BY MOUTH AT BEDTIME AS NEEDED (NAUSEA OR VOMITING).   METFORMIN (GLUCOPHAGE) 500 MG TABLET    Take 1 tablet (500 mg total) by mouth 2 (two) times daily with a meal.   METOPROLOL SUCCINATE (TOPROL-XL) 50 MG 24 HR TABLET    TAKE 1 TABLET (50 MG TOTAL) BY MOUTH DAILY. TAKE WITH OR IMMEDIATELY FOLLOWING A MEAL.   MOMETASONE (NASONEX) 50 MCG/ACT NASAL SPRAY    PLACE 2 SPRAYS INTO THE NOSE DAILY.   MULTIPLE VITAMIN (MULTIVITAMIN) TABLET    Take 1 tablet by mouth daily.   ONE TOUCH LANCETS MISC    Use as instructed to test blood sugar daily   ONETOUCH VERIO TEST STRIP    USE AS INSTRUCTED TO TEST BLOOD SUGAR DAILY NEED APPOINTMENT FOR ANY MORE REFILLS   OXYCODONE (OXY IR/ROXICODONE)  5 MG IMMEDIATE RELEASE TABLET    Take 1 tablet (5 mg total) by mouth every 6 (six) hours as needed for moderate pain or severe pain.   PANTOPRAZOLE (PROTONIX) 40 MG TABLET    Take 40 mg by mouth daily as needed (for indigestion/acid reflux.).    POTASSIUM CHLORIDE (K-DUR) 10 MEQ TABLET    Take 2 tablets (20 mEq total) by mouth 2 (two) times daily.   PROAIR RESPICLICK 945 (90 BASE) MCG/ACT AEPB    INHALE 2 PUFFS INTO THE LUNGS EVERY 6 HOURS AS NEEDED FOR WHEEZING OR SHORTNESS OF BREATH.   PROCHLORPERAZINE (COMPAZINE) 10 MG TABLET    TAKE 1 TABLET BY MOUTH EVERY 6 HOURS AS NEEDED FOR NAUSEA OR VOMITING   SULFAMETHOXAZOLE-TRIMETHOPRIM (BACTRIM DS,SEPTRA DS) 800-160 MG TABLET    Take 1 tablet by mouth 2 (two) times daily.   TRAMADOL  (ULTRAM) 50 MG TABLET    Take 1 tablet (50 mg total) by mouth every 6 (six) hours as needed.  Modified Medications   No medications on file  Discontinued Medications   DOXYCYCLINE (VIBRA-TABS) 100 MG TABLET    Take 1 tablet (100 mg total) by mouth daily.    Subjective: [name] is in for her routine follow-up visit.  She just recently completed chemotherapy for her breast cancer.  She is scheduled for lumpectomy next week.  She has had no problems obtaining, taking or tolerating her Descovy or [name].  She says that she has missed about 3 doses recently.  She uses her pillbox and normally takes her medication in the morning.  She says that on several occasions she has simply forgotten to take it.  She has been very fatigued recently.  She has had some nausea but no vomiting.  She tells me that she is not feeling depressed but that she is started having episodes of anxiety that she does not understand.  She continues to take doxycycline for her hidradenitis but does not think that it has had any positive impact.  Review of Systems: Review of Systems  Constitutional: Positive for malaise/fatigue. Negative for chills, diaphoresis and fever.  HENT: Negative for congestion and sore throat.   Respiratory: Positive for shortness of breath. Negative for cough and sputum production.   Cardiovascular: Negative for chest pain.  Gastrointestinal: Positive for nausea. Negative for abdominal pain, diarrhea and vomiting.  Genitourinary: Negative for dysuria.  Musculoskeletal: Negative for back pain and joint pain.  Skin: Positive for rash.  Neurological: Negative for headaches.  Psychiatric/Behavioral: Negative for depression. The patient is nervous/anxious.     Past Medical History:  Diagnosis Date  . Anemia    Normocytic  . Anxiety   . Asthma   . Blood dyscrasia   . Bronchitis 2005  . 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 Diabetes mellitus without complication ([place])   . Family history of  breast cancer   . Family history of lung cancer   . Family history of prostate cancer   . Gastroenteritis 07/10/07  . GERD 07/24/2006  . GRAVE'S DISEASE 01/01/2008  . History of hidradenitis suppurativa   . History of kidney stones   . History of thrush   . HIV DISEASE 07/24/2006   dx March 05  . HYPERTENSION 07/24/2006  . Hyperthyroidism 08/2006   Grave's Disease -diffuse radiotracer uptake 08/25/06 Thyroid scan-Cold nodule to R lower lobe of thyrorid  . Menometrorrhagia    hx of  . Nephrolithiasis   . 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 Pneumonia 2005  . 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 Sarcoidosis 02/08/2007   dx as a teenager in [place] from abnl CXR. Completed 2 yrs Prednisone after lung bx confirmation. No symptoms since then.  . Thyroid cancer ([place])   . THYROID NODULE, RIGHT 02/08/2007    Social History   Tobacco Use  . Smoking status: Former Smoker    Packs/day: 0.50    Years: 15.00    Pack years: 7.50    Types: Cigarettes    Start date: 04/12/2017  . Smokeless tobacco: Never Used  Substance Use Topics  . Alcohol use: Yes    Alcohol/week: 0.0 standard drinks    Comment: social  . Drug use: No    Family History  Problem Relation Age of Onset  . Hypertension Mother   . Cancer Mother        laryngeal  . Heart disease Mother        stent  . Hypertension Father   . Lung cancer Father 55       hx smoking  . Heart disease Other   . Hypertension Other   . Stroke Other        Grandparent  . Kidney disease Other        Grandparent  . Diabetes Other        FH of Diabetes  . Hypertension Sister   . Cancer Maternal Uncle        Lung CA  . Hypertension Brother   . Hypertension Sister   . Breast cancer Maternal Aunt 65  . Breast cancer Paternal Aunt 48  . Prostate cancer Paternal Uncle   . Breast cancer Maternal Aunt        dx 60+  . Breast cancer Paternal Aunt        dx 88's  .  Breast cancer Paternal Aunt        dx 28's  . Prostate cancer Paternal Uncle   . Lung cancer Paternal Uncle   . Breast cancer Cousin 97  . Breast cancer Cousin        dx <50  . Breast cancer Cousin        dx <50  . Breast cancer Cousin        dx <50    Allergies  Allergen Reactions  . Genvoya [Elviteg-Cobic-Emtricit-Tenofaf] Hives  . Lisinopril Cough  . Valsartan Cough    Pt states she tolerates medicine now    Health Maintenance  Topic Date Due  . PAP SMEAR-Modifier  01/28/2014  . OPHTHALMOLOGY EXAM  07/15/2016  . COLONOSCOPY  05/14/2018  . HEMOGLOBIN A1C  02/22/2019  . FOOT EXAM  08/25/2019  . TETANUS/TDAP  10/18/2019  . MAMMOGRAM  04/20/2020  . INFLUENZA VACCINE  Completed  . PNEUMOCOCCAL POLYSACCHARIDE VACCINE AGE 45-64 HIGH RISK  Completed  . HIV Screening  Completed    Objective:  Vitals:   09/27/18 1339  BP: 117/77  Pulse: 83  Temp: 98.6 F (37 C)  Weight: 235 lb (106.6 kg)   Body mass index is 39.11 kg/m.  Physical Exam Constitutional:      Comments: Her weight is unchanged.  She is in good spirits as usual.  HENT:     Mouth/Throat:     Pharynx: No oropharyngeal exudate.  Eyes:     Conjunctiva/sclera: Conjunctivae normal.  Cardiovascular:     Rate and Rhythm: Normal rate and regular rhythm.     Heart sounds: No murmur.  Pulmonary:  Effort: Pulmonary effort is normal.     Breath sounds: Normal breath sounds.  Abdominal:     General: There is no distension.     Palpations: Abdomen is soft.     Tenderness: There is no abdominal tenderness.  Psychiatric:        Mood and Affect: Mood normal.     Lab Results Lab Results  Component Value Date   WBC 29.2 (H) 09/19/2018   HGB 9.3 (L) 09/19/2018   HCT 28.9 (L) 09/19/2018   MCV 96.7 09/19/2018   PLT 305 09/19/2018    Lab Results  Component Value Date   CREATININE 1.44 (H) 09/19/2018   BUN 14 09/19/2018   NA 139 09/19/2018   K 3.3 (L) 09/19/2018   CL 103 09/19/2018   CO2 25  09/19/2018    Lab Results  Component Value Date   ALT 18 09/19/2018   AST 18 09/19/2018   ALKPHOS 142 (H) 09/19/2018   BILITOT <0.2 (L) 09/19/2018    Lab Results  Component Value Date   CHOL 117 10/23/2017   HDL 33 (L) 10/23/2017   LDLCALC 61 10/23/2017   TRIG 149 10/23/2017   CHOLHDL 3.5 10/23/2017   Lab Results  Component Value Date   LABRPR NON-REACTIVE 10/23/2017   HIV 1 RNA Quant (copies/mL)  Date Value  07/11/2018 <20 DETECTED (A)  05/28/2018 <20 NOT DETECTED  10/23/2017 51 (H)   CD4 T Cell Abs (/uL)  Date Value  07/11/2018 1,070  05/28/2018 1,250  10/23/2017 1,250     Problem List Items Addressed This Visit      High   Human immunodeficiency virus (HIV) disease ([place])    Her infection has been under excellent, long-term control.  She has started to miss some doses since undergoing chemotherapy.  Encouraged her to set her cell phone alarm to remind her.  We will get lab work today and see her back in 3 months.      Relevant Orders   T-helper cell (CD4)- ([facility] clinic only)   HIV-1 RNA quant-no reflex-bld   Depression    Chronic depression seems to be under better control but she is having some anxiety attacks now.  I had her meet with her behavioral health counselor, [name], today.        Medium   Hidradenitis suppurativa of right >> left axilla    Not surprisingly, the doxycycline has not had much positive impact on her hidradenitis.  I will have her stop it now.           Michel Bickers, MD [facility] [phone] pager   [phone] cell 09/27/2018, 2:04 PM

## 2018-09-27 NOTE — Assessment & Plan Note (Signed)
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  She has started to miss some doses since undergoing chemotherapy.  Encouraged her to set her cell phone alarm to remind her.  We will get lab work today and see her back in 3 months.

## 2018-09-27 NOTE — Assessment & Plan Note (Signed)
Chronic depression seems to be under better control but she is having some anxiety attacks now.  I had her meet with her behavioral health counselor, Lillie Fragmin, today.

## 2018-09-28 ENCOUNTER — Other Ambulatory Visit: Payer: Self-pay

## 2018-09-28 ENCOUNTER — Inpatient Hospital Stay (HOSPITAL_COMMUNITY)
Admission: RE | Admit: 2018-09-28 | Discharge: 2018-09-28 | Disposition: A | Discharge status: Home / Self Care | Payer: Self-pay | Source: Ambulatory Visit

## 2018-09-28 ENCOUNTER — Encounter (HOSPITAL_COMMUNITY): Payer: Self-pay

## 2018-09-28 ENCOUNTER — Encounter (HOSPITAL_COMMUNITY)
Admission: RE | Admit: 2018-09-28 | Discharge: 2018-09-28 | Disposition: A | Discharge status: Home / Self Care | Payer: Managed Care, Other (non HMO) | Source: Ambulatory Visit | Attending: Surgery | Admitting: Surgery

## 2018-09-28 DIAGNOSIS — Z170 Estrogen receptor positive status [ER+]: Secondary | ICD-10-CM | POA: Insufficient documentation

## 2018-09-28 DIAGNOSIS — Z01818 Encounter for other preprocedural examination: Secondary | ICD-10-CM | POA: Diagnosis not present

## 2018-09-28 DIAGNOSIS — C50911 Malignant neoplasm of unspecified site of right female breast: Secondary | ICD-10-CM

## 2018-09-28 DIAGNOSIS — C50311 Malignant neoplasm of lower-inner quadrant of right female breast: Secondary | ICD-10-CM | POA: Insufficient documentation

## 2018-09-28 LAB — T-HELPER CELL (CD4) - (RCID CLINIC ONLY)
CD4 % Helper T Cell: 36 % (ref 33–55)
CD4 T Cell Abs: 1110 /uL (ref 400–2700)

## 2018-09-28 LAB — COMPREHENSIVE METABOLIC PANEL
ALT: 17 U/L (ref 0–44)
ANION GAP: 11 (ref 5–15)
AST: 18 U/L (ref 15–41)
Albumin: 3.6 g/dL (ref 3.5–5.0)
Alkaline Phosphatase: 139 U/L — ABNORMAL HIGH (ref 38–126)
BUN: 12 mg/dL (ref 6–20)
CO2: 28 mmol/L (ref 22–32)
Calcium: 9.6 mg/dL (ref 8.9–10.3)
Chloride: 101 mmol/L (ref 98–111)
Creatinine, Ser: 1.66 mg/dL — ABNORMAL HIGH (ref 0.44–1.00)
GFR calc Af Amer: 41 mL/min — ABNORMAL LOW (ref >60)
GFR calc non Af Amer: 36 mL/min — ABNORMAL LOW (ref >60)
Glucose, Bld: 84 mg/dL (ref 70–99)
POTASSIUM: 4.1 mmol/L (ref 3.5–5.1)
Sodium: 140 mmol/L (ref 135–145)
Total Bilirubin: 0.2 mg/dL — ABNORMAL LOW (ref 0.3–1.2)
Total Protein: 8.1 g/dL (ref 6.5–8.1)

## 2018-09-28 LAB — CBC WITH DIFFERENTIAL/PLATELET
Abs Immature Granulocytes: 0 10*3/uL (ref 0.00–0.07)
Basophils Absolute: 0 10*3/uL (ref 0.0–0.1)
Basophils Relative: 0 %
Eosinophils Absolute: 0 10*3/uL (ref 0.0–0.5)
Eosinophils Relative: 0 %
HCT: 29.5 % — ABNORMAL LOW (ref 36.0–46.0)
Hemoglobin: 8.8 g/dL — ABNORMAL LOW (ref 12.0–15.0)
LYMPHS ABS: 3.1 10*3/uL (ref 0.7–4.0)
Lymphocytes Relative: 28 %
MCH: 29.6 pg (ref 26.0–34.0)
MCHC: 29.8 g/dL — AB (ref 30.0–36.0)
MCV: 99.3 fL (ref 80.0–100.0)
Monocytes Absolute: 0.4 10*3/uL (ref 0.1–1.0)
Monocytes Relative: 4 %
Neutro Abs: 7.5 10*3/uL (ref 1.7–7.7)
Neutrophils Relative %: 68 %
PLATELETS: 213 10*3/uL (ref 150–400)
RBC: 2.97 MIL/uL — ABNORMAL LOW (ref 3.87–5.11)
RDW: 22 % — ABNORMAL HIGH (ref 11.5–15.5)
WBC: 11 10*3/uL — ABNORMAL HIGH (ref 4.0–10.5)
nRBC: 0 % (ref 0.0–0.2)
nRBC: 0 /100 WBC

## 2018-09-28 LAB — GLUCOSE, CAPILLARY: Glucose-Capillary: 81 mg/dL (ref 70–99)

## 2018-09-28 NOTE — Progress Notes (Addendum)
LENKA ZHAO            09/28/2018                          [id number] [address], [place] [address] Phone: [phone] Fax: [phone]  [facility]) - [place], [place] [address] [place] [address] Phone: [phone] Fax: [phone]              Your procedure is scheduled on October 03, 2018.            Report to [facility] Admitting at 800 AM.            Call this number if you have problems the morning of surgery:            [phone]             Remember:            Do not eat or drink after midnight.            You may drink clear liquids until 700 AM.  Clear liquids allowed are:  Water, Juice (non-citric and without pulp), Clear Tea, Black Coffee only and Gatorade  Please complete your PRE-SURGERY ENSURE that was given to by 700AM the morning of surgery.  Please, if able, drink it in one setting. DO NOT SIP.                         Take these medicines the morning of surgery with A SIP OF WATER  Amlodipine (norvasc) Metoprolol succinate (Toprol XL) dexamethason (decadron) dolutegravir ([name]) emtricitabinetenofovir (Descovy) Levothyroxine (synthroid)   As of today, stop taking any Aspirin (unless otherwise instructed by your surgeon), Aleve, Naproxen, Ibuprofen, Motrin, Advil, Goody's, BC's, all herbal medications, fish oil, and all vitamins              WHAT DO I DO ABOUT MY DIABETES MEDICATION?   Do not take oral diabetes medicines (pills) the morning of surgery-metformin (glucophage)   Reviewed and Endorsed by [facility] Patient Education Committee, August 2015  HOW TO [facility] YOUR DIABETES BEFORE AND AFTER SURGERY  Why is it important to control my blood sugar before and after surgery?  Improving blood sugar levels before and after surgery helps healing and can limit problems.  A way of improving blood sugar control is eating  a healthy diet by: ?  Eating less sugar and carbohydrates ?  Increasing activity/exercise ?  Talking with your doctor about reaching your blood sugar goals  High blood sugars (greater than 180 mg/dL) can raise your risk of infections and slow your recovery, so you will need to focus on controlling your diabetes during the weeks before surgery.  Make sure that the doctor who takes care of your diabetes knows about your planned surgery including the date and location.  How do I manage my blood sugar before surgery?  Check your blood sugar at least 4 times a day, starting 2 days before surgery, to make sure that the level is not too high or low. ? Check your blood sugar the morning of your surgery when you wake up and every 2 hours until you get to the Short Stay unit.  If your blood sugar is less than 70 mg/dL, you will need to treat for low blood sugar: ? Do not  take insulin. ? Treat a low blood sugar (less than 70 mg/dL) with  cup of clear juice (cranberry or apple), 4glucose tablets, OR glucose gel. Recheck blood sugar in 15 minutes after treatment (to make sure it is greater than 70 mg/dL). If your blood sugar is not greater than 70 mg/dL on recheck, call [phone] ?  for further instructions.  Report your blood sugar to the short stay nurse when you get to Short Stay.   If you are admitted to the hospital after surgery: ? Your blood sugar will be checked by the staff and you will probably be given insulin after surgery (instead of oral diabetes medicines) to make sure you have good blood sugar levels. ? The goal for blood sugar control after surgery is 80-180 mg/dL.  [place]- Preparing For Surgery  Before surgery, you can play an important role. Because skin is not sterile, your skin needs to be as free of germs as possible. You can reduce the number of germs on your skin by washing with CHG (chlorahexidine gluconate) Soap before surgery.  CHG is an antiseptic cleaner  which kills germs and bonds with the skin to continue killing germs even after washing.    Oral Hygiene is also important to reduce your risk of infection.  Remember - BRUSH YOUR TEETH THE MORNING OF SURGERY WITH YOUR REGULAR TOOTHPASTE  Please do not use if you have an allergy to CHG or antibacterial soaps. If your skin becomes reddened/irritated stop using the CHG.  Do not shave (including legs and underarms) for at least 48 hours prior to first CHG shower. It is OK to shave your face.  Please follow these instructions carefully.                                                                                                                     1. Shower the NIGHT BEFORE SURGERY and the MORNING OF SURGERY with CHG.   2. If you chose to wash your hair, wash your hair first as usual with your normal shampoo.  3. After you shampoo, rinse your hair and body thoroughly to remove the shampoo.  4. Use CHG as you would any other liquid soap. You can apply CHG directly to the skin and wash gently with a scrungie or a clean washcloth.   5. Apply the CHG Soap to your body ONLY FROM THE NECK DOWN.  Do not use on open wounds or open sores. Avoid contact with your eyes, ears, mouth and genitals (private parts). Wash Face and genitals (private parts)  with your normal soap.  6. Wash thoroughly, paying special attention to the area where your surgery will be performed.  7. Thoroughly rinse your body with warm water from the neck down.  8. DO NOT shower/wash with your normal soap after using and rinsing off the CHG Soap.  9. Pat yourself dry with a CLEAN TOWEL.  10. Wear CLEAN PAJAMAS to bed the night before surgery, wear comfortable clothes the morning  of surgery  11. Place CLEAN SHEETS on your bed the night of your first shower and DO NOT SLEEP WITH PETS.  Day of Surgery:  Do not apply any deodorants/lotions.  Please wear clean clothes to the hospital/surgery center.   Remember to  brush your teeth WITH YOUR REGULAR TOOTHPASTE.             Do not wear jewelry, make-up or nail polish.            Do not wear lotions, powders, or perfumes, or deodorant.            Do not shave 48 hours prior to surgery.  Men may shave face and neck.            Do not bring valuables to the hospital.            [facility] is not responsible for any belongings or valuables.  Contacts, dentures or bridgework may not be worn into surgery.  Leave your suitcase in the car.  After surgery it may be brought to your room.  For patients admitted to the hospital, discharge time will be determined by your treatment team.  Patients discharged the day of surgery will not be allowed to drive home.    Please read over the following fact sheets that you were given.

## 2018-09-28 NOTE — Progress Notes (Signed)
PCP - Dr. Loma Boston  Cardiologist - Denies  Chest x-ray - 09/19/18 (E)  EKG - 10/13/17 (E)  Stress Test - 09/03/18 (E)  ECHO - 08/17/18 (E)  Cardiac Cath - Denies  AICD- na PM- na LOOP- na  Sleep Study - Denies CPAP - None  LABS- 09/28/18: CBC w/D, CMP  ASA-Denies  HA1C- 08/24/18: 6.2 (E) Fasting Blood Sugar - 81-121, today 81 Checks Blood Sugar ___2__ times a day  Anesthesia- No  Pt denies having chest pain, sob, or fever at this time. All instructions explained to the pt, with a verbal understanding of the material. Pt agrees to go over the instructions while at home for a better understanding. The opportunity to ask questions was provided.

## 2018-10-01 ENCOUNTER — Ambulatory Visit
Admission: RE | Admit: 2018-10-01 | Discharge: 2018-10-01 | Disposition: A | Discharge status: Home / Self Care | Payer: Managed Care, Other (non HMO) | Source: Ambulatory Visit | Attending: Surgery | Admitting: Surgery

## 2018-10-01 ENCOUNTER — Other Ambulatory Visit: Payer: Self-pay | Admitting: Surgery

## 2018-10-01 ENCOUNTER — Encounter: Payer: Self-pay | Admitting: Oncology

## 2018-10-01 ENCOUNTER — Other Ambulatory Visit (HOSPITAL_COMMUNITY): Payer: Self-pay

## 2018-10-01 DIAGNOSIS — C50911 Malignant neoplasm of unspecified site of right female breast: Secondary | ICD-10-CM

## 2018-10-01 NOTE — Progress Notes (Signed)
Patient called and left voicemail for me to return call.  Called patient and no answer, left voicemail.  Patient returned my call. Advised her of my name and location of where I was calling from  and I was returning her call and asked how may I help her. Patient paused and the phone seemed to be echoing and fading out and I asked again how may I help her. She asked again where I was calling from and I advised I was returning her call from the [place]. Patient proceeded to ask if I was having a bad day because I asked how could I help her and then she said thank you and hung up the phone.    Waited a few minutes and returned call. I advised patient that I was calling again from the [place] and that the phone seemed to be fading in and out and echoing. Advised patient this was happening on the first call as well which caused some difficulty trying to determine her need.She seemed frustrated from her tone. Patient states she was driving and begin to tell me the reason for her call, Patient having financial difficulties. Asked patient about treatment status and advised she may apply for the one-time $1000 [employer] grant and advised what is needed. Patient thanked me and agreed to send the information. Asked when she could come in to fill out paperwork and she states maybe tomorrow 12/17 morning because she is having surgery Wednesday. Emailed her per her request so that she could have my contact information to send information.  She has my name and contact number for any additional financial questions or concerns.

## 2018-10-02 ENCOUNTER — Encounter: Payer: Self-pay | Admitting: Oncology

## 2018-10-02 LAB — HIV-1 RNA QUANT-NO REFLEX-BLD
HIV 1 RNA Quant: <20 copies/mL — AB
HIV-1 RNA Quant, Log: <1.3 Log copies/mL — AB

## 2018-10-02 NOTE — Progress Notes (Signed)
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 Patient approved. She has a copy of the approval letter as well as the expense sheet along with the [facility] pharmacy information. She submitted an expense to be paid as well.  She has my card for any additional financial questions or concerns.

## 2018-10-03 ENCOUNTER — Encounter (HOSPITAL_COMMUNITY)
Admission: RE | Disposition: A | Discharge status: Home / Self Care | Payer: Self-pay | Source: Home / Self Care | Attending: Surgery

## 2018-10-03 ENCOUNTER — Ambulatory Visit (HOSPITAL_COMMUNITY)
Admission: RE | Admit: 2018-10-03 | Discharge: 2018-10-03 | Disposition: A | Discharge status: Home / Self Care | Payer: Managed Care, Other (non HMO) | Attending: Surgery | Admitting: Surgery

## 2018-10-03 ENCOUNTER — Other Ambulatory Visit: Payer: Self-pay

## 2018-10-03 ENCOUNTER — Ambulatory Visit (HOSPITAL_COMMUNITY): Payer: Managed Care, Other (non HMO) | Admitting: Anesthesiology

## 2018-10-03 ENCOUNTER — Ambulatory Visit (HOSPITAL_COMMUNITY)
Admission: RE | Admit: 2018-10-03 | Discharge: 2018-10-03 | Disposition: A | Discharge status: Home / Self Care | Payer: Managed Care, Other (non HMO) | Source: Ambulatory Visit | Attending: Surgery | Admitting: Surgery

## 2018-10-03 ENCOUNTER — Ambulatory Visit
Admission: RE | Admit: 2018-10-03 | Discharge: 2018-10-03 | Disposition: A | Discharge status: Home / Self Care | Payer: Managed Care, Other (non HMO) | Source: Ambulatory Visit | Attending: Surgery | Admitting: Surgery

## 2018-10-03 ENCOUNTER — Encounter (HOSPITAL_COMMUNITY): Payer: Self-pay

## 2018-10-03 DIAGNOSIS — Z79899 Other long term (current) drug therapy: Secondary | ICD-10-CM | POA: Insufficient documentation

## 2018-10-03 DIAGNOSIS — J45909 Unspecified asthma, uncomplicated: Secondary | ICD-10-CM | POA: Diagnosis not present

## 2018-10-03 DIAGNOSIS — Z87891 Personal history of nicotine dependence: Secondary | ICD-10-CM | POA: Insufficient documentation

## 2018-10-03 DIAGNOSIS — I10 Essential (primary) hypertension: Secondary | ICD-10-CM | POA: Diagnosis not present

## 2018-10-03 DIAGNOSIS — Z79890 Hormone replacement therapy (postmenopausal): Secondary | ICD-10-CM | POA: Insufficient documentation

## 2018-10-03 DIAGNOSIS — E119 Type 2 diabetes mellitus without complications: Secondary | ICD-10-CM | POA: Diagnosis not present

## 2018-10-03 DIAGNOSIS — E039 Hypothyroidism, unspecified: Secondary | ICD-10-CM | POA: Diagnosis not present

## 2018-10-03 DIAGNOSIS — F419 Anxiety disorder, unspecified: Secondary | ICD-10-CM | POA: Diagnosis not present

## 2018-10-03 DIAGNOSIS — Z21 Asymptomatic human immunodeficiency virus [HIV] infection status: Secondary | ICD-10-CM | POA: Diagnosis not present

## 2018-10-03 DIAGNOSIS — C50911 Malignant neoplasm of unspecified site of right female breast: Secondary | ICD-10-CM | POA: Diagnosis not present

## 2018-10-03 DIAGNOSIS — Z7984 Long term (current) use of oral hypoglycemic drugs: Secondary | ICD-10-CM | POA: Diagnosis not present

## 2018-10-03 DIAGNOSIS — Z888 Allergy status to other drugs, medicaments and biological substances status: Secondary | ICD-10-CM | POA: Insufficient documentation

## 2018-10-03 HISTORY — PX: BREAST LUMPECTOMY WITH RADIOACTIVE SEED AND SENTINEL LYMPH NODE BIOPSY: SHX6550

## 2018-10-03 HISTORY — PX: BREAST LUMPECTOMY: SHX2

## 2018-10-03 LAB — GLUCOSE, CAPILLARY
Glucose-Capillary: 228 mg/dL — ABNORMAL HIGH (ref 70–99)
Glucose-Capillary: 92 mg/dL (ref 70–99)

## 2018-10-03 SURGERY — BREAST LUMPECTOMY WITH RADIOACTIVE SEED AND SENTINEL LYMPH NODE BIOPSY
Anesthesia: General | Site: Breast | Laterality: Right

## 2018-10-03 MED ORDER — ROCURONIUM BROMIDE 50 MG/5ML IV SOSY
PREFILLED_SYRINGE | INTRAVENOUS | Status: AC
  Filled 2018-10-03: qty 5

## 2018-10-03 MED ORDER — MIDAZOLAM HCL 2 MG/2ML IJ SOLN: 0.5000 mg | Freq: Once | INTRAMUSCULAR | Status: DC | PRN

## 2018-10-03 MED ORDER — LIDOCAINE 2% (20 MG/ML) 5 ML SYRINGE
INTRAMUSCULAR | Status: AC
  Filled 2018-10-03: qty 5

## 2018-10-03 MED ORDER — MEPERIDINE HCL 50 MG/ML IJ SOLN: 6.2500 mg | INTRAMUSCULAR | Status: DC | PRN

## 2018-10-03 MED ORDER — 0.9 % SODIUM CHLORIDE (POUR BTL) OPTIME
TOPICAL | Status: DC | PRN
  Administered 2018-10-03: 1000 mL

## 2018-10-03 MED ORDER — ONDANSETRON HCL 4 MG/2ML IJ SOLN
INTRAMUSCULAR | Status: DC | PRN
  Administered 2018-10-03: 4 mg via INTRAVENOUS

## 2018-10-03 MED ORDER — PROPOFOL 10 MG/ML IV BOLUS
INTRAVENOUS | Status: AC
  Filled 2018-10-03: qty 20

## 2018-10-03 MED ORDER — OXYCODONE HCL 5 MG PO TABS: 5.0000 mg | ORAL_TABLET | Freq: Four times a day (QID) | ORAL | 0 refills | Status: DC | PRN

## 2018-10-03 MED ORDER — SODIUM CHLORIDE (PF) 0.9 % IJ SOLN
INTRAVENOUS | Status: DC | PRN
  Administered 2018-10-03: 5 mL

## 2018-10-03 MED ORDER — BUPIVACAINE-EPINEPHRINE (PF) 0.25% -1:200000 IJ SOLN
INTRAMUSCULAR | Status: AC
  Filled 2018-10-03: qty 30

## 2018-10-03 MED ORDER — CHLORHEXIDINE GLUCONATE CLOTH 2 % EX PADS: 6.0000 | MEDICATED_PAD | Freq: Once | CUTANEOUS | Status: DC

## 2018-10-03 MED ORDER — FENTANYL CITRATE (PF) 250 MCG/5ML IJ SOLN
INTRAMUSCULAR | Status: AC
  Filled 2018-10-03: qty 5

## 2018-10-03 MED ORDER — FENTANYL CITRATE (PF) 100 MCG/2ML IJ SOLN: 25.0000 ug | INTRAMUSCULAR | Status: DC | PRN

## 2018-10-03 MED ORDER — CEFAZOLIN SODIUM-DEXTROSE 2-4 GM/100ML-% IV SOLN
INTRAVENOUS | Status: AC
  Filled 2018-10-03: qty 100

## 2018-10-03 MED ORDER — SODIUM CHLORIDE (PF) 0.9 % IJ SOLN
INTRAMUSCULAR | Status: AC
  Filled 2018-10-03: qty 10

## 2018-10-03 MED ORDER — GABAPENTIN 300 MG PO CAPS
300.0000 mg | ORAL_CAPSULE | ORAL | Status: AC
  Administered 2018-10-03: 300 mg via ORAL

## 2018-10-03 MED ORDER — DEXAMETHASONE SODIUM PHOSPHATE 10 MG/ML IJ SOLN
INTRAMUSCULAR | Status: AC
  Filled 2018-10-03: qty 1

## 2018-10-03 MED ORDER — MIDAZOLAM HCL 2 MG/2ML IJ SOLN
INTRAMUSCULAR | Status: AC
  Administered 2018-10-03: 2 mg via INTRAVENOUS
  Filled 2018-10-03: qty 2

## 2018-10-03 MED ORDER — KETOROLAC TROMETHAMINE 15 MG/ML IJ SOLN
INTRAMUSCULAR | Status: AC
  Administered 2018-10-03: 15 mg via INTRAVENOUS
  Filled 2018-10-03: qty 1

## 2018-10-03 MED ORDER — LACTATED RINGERS IV SOLN
INTRAVENOUS | Status: DC
  Administered 2018-10-03 (×2): via INTRAVENOUS

## 2018-10-03 MED ORDER — MIDAZOLAM HCL 2 MG/2ML IJ SOLN
2.0000 mg | Freq: Once | INTRAMUSCULAR | Status: AC
  Administered 2018-10-03: 2 mg via INTRAVENOUS

## 2018-10-03 MED ORDER — LIDOCAINE 2% (20 MG/ML) 5 ML SYRINGE
INTRAMUSCULAR | Status: DC | PRN
  Administered 2018-10-03: 60 mg via INTRAVENOUS
  Administered 2018-10-03: 40 mg via INTRAVENOUS

## 2018-10-03 MED ORDER — DEXAMETHASONE SODIUM PHOSPHATE 10 MG/ML IJ SOLN
INTRAMUSCULAR | Status: DC | PRN
  Administered 2018-10-03: 4 mg via INTRAVENOUS

## 2018-10-03 MED ORDER — CEFAZOLIN SODIUM-DEXTROSE 2-4 GM/100ML-% IV SOLN
2.0000 g | INTRAVENOUS | Status: AC
  Administered 2018-10-03: 2 g via INTRAVENOUS

## 2018-10-03 MED ORDER — ACETAMINOPHEN 500 MG PO TABS
ORAL_TABLET | ORAL | Status: AC
  Administered 2018-10-03: 1000 mg via ORAL
  Filled 2018-10-03: qty 2

## 2018-10-03 MED ORDER — METHYLENE BLUE 0.5 % INJ SOLN
INTRAVENOUS | Status: AC
  Filled 2018-10-03: qty 10

## 2018-10-03 MED ORDER — PROPOFOL 10 MG/ML IV BOLUS
INTRAVENOUS | Status: DC | PRN
  Administered 2018-10-03: 30 mg via INTRAVENOUS
  Administered 2018-10-03: 50 mg via INTRAVENOUS
  Administered 2018-10-03: 200 mg via INTRAVENOUS
  Administered 2018-10-03: 20 mg via INTRAVENOUS

## 2018-10-03 MED ORDER — FENTANYL CITRATE (PF) 100 MCG/2ML IJ SOLN
INTRAMUSCULAR | Status: AC
  Administered 2018-10-03: 100 ug via INTRAVENOUS
  Filled 2018-10-03: qty 2

## 2018-10-03 MED ORDER — ACETAMINOPHEN 500 MG PO TABS
1000.0000 mg | ORAL_TABLET | ORAL | Status: AC
  Administered 2018-10-03: 1000 mg via ORAL

## 2018-10-03 MED ORDER — ONDANSETRON HCL 4 MG/2ML IJ SOLN
INTRAMUSCULAR | Status: AC
  Filled 2018-10-03: qty 2

## 2018-10-03 MED ORDER — PROMETHAZINE HCL 25 MG/ML IJ SOLN: 6.2500 mg | INTRAMUSCULAR | Status: DC | PRN

## 2018-10-03 MED ORDER — FENTANYL CITRATE (PF) 100 MCG/2ML IJ SOLN
100.0000 ug | Freq: Once | INTRAMUSCULAR | Status: AC
  Administered 2018-10-03: 100 ug via INTRAVENOUS

## 2018-10-03 MED ORDER — BUPIVACAINE-EPINEPHRINE (PF) 0.5% -1:200000 IJ SOLN
INTRAMUSCULAR | Status: DC | PRN
  Administered 2018-10-03: 30 mL

## 2018-10-03 MED ORDER — KETOROLAC TROMETHAMINE 15 MG/ML IJ SOLN
15.0000 mg | INTRAMUSCULAR | Status: AC
  Administered 2018-10-03: 15 mg via INTRAVENOUS

## 2018-10-03 MED ORDER — GABAPENTIN 300 MG PO CAPS
ORAL_CAPSULE | ORAL | Status: AC
  Administered 2018-10-03: 300 mg via ORAL
  Filled 2018-10-03: qty 1

## 2018-10-03 MED ORDER — BUPIVACAINE-EPINEPHRINE 0.25% -1:200000 IJ SOLN
INTRAMUSCULAR | Status: DC | PRN
  Administered 2018-10-03: 17 mL

## 2018-10-03 MED ORDER — TECHNETIUM TC 99M SULFUR COLLOID FILTERED
1.0000 | Freq: Once | INTRAVENOUS | Status: AC | PRN
  Administered 2018-10-03: 1 via INTRADERMAL

## 2018-10-03 MED ORDER — SULFAMETHOXAZOLE-TRIMETHOPRIM 800-160 MG PO TABS: 1.0000 | ORAL_TABLET | Freq: Two times a day (BID) | ORAL | 1 refills | Status: DC

## 2018-10-03 SURGICAL SUPPLY — 48 items
APPLIER CLIP 9.375 MED OPEN (MISCELLANEOUS) ×2
BINDER BREAST LRG (GAUZE/BANDAGES/DRESSINGS) IMPLANT
BINDER BREAST XLRG (GAUZE/BANDAGES/DRESSINGS) IMPLANT
BINDER BREAST XXLRG (GAUZE/BANDAGES/DRESSINGS) ×2 IMPLANT
BLADE SURG 15 STRL LF DISP TIS (BLADE) IMPLANT
BLADE SURG 15 STRL SS (BLADE)
CANISTER SUCT 3000ML PPV (MISCELLANEOUS) IMPLANT
CHLORAPREP W/TINT 26ML (MISCELLANEOUS) ×2 IMPLANT
CLIP APPLIE 9.375 MED OPEN (MISCELLANEOUS) ×1 IMPLANT
CONT SPEC 4OZ CLIKSEAL STRL BL (MISCELLANEOUS) ×4 IMPLANT
COVER PROBE W GEL 5X96 (DRAPES) ×4 IMPLANT
COVER SURGICAL LIGHT HANDLE (MISCELLANEOUS) ×2 IMPLANT
COVER WAND RF STERILE (DRAPES) IMPLANT
DERMABOND ADVANCED (GAUZE/BANDAGES/DRESSINGS) ×1
DERMABOND ADVANCED .7 DNX12 (GAUZE/BANDAGES/DRESSINGS) ×1 IMPLANT
DEVICE DUBIN SPECIMEN MAMMOGRA (MISCELLANEOUS) IMPLANT
DRAPE CHEST BREAST 15X10 FENES (DRAPES) ×2 IMPLANT
DRAPE UTILITY XL STRL (DRAPES) IMPLANT
ELECT CAUTERY BLADE 6.4 (BLADE) ×2 IMPLANT
ELECT REM PT RETURN 9FT ADLT (ELECTROSURGICAL) ×2
ELECTRODE REM PT RTRN 9FT ADLT (ELECTROSURGICAL) ×1 IMPLANT
GLOVE BIO SURGEON STRL SZ8 (GLOVE) ×2 IMPLANT
GLOVE BIOGEL PI IND STRL 8 (GLOVE) ×1 IMPLANT
GLOVE BIOGEL PI INDICATOR 8 (GLOVE) ×1
GOWN STRL REUS W/ TWL LRG LVL3 (GOWN DISPOSABLE) ×1 IMPLANT
GOWN STRL REUS W/ TWL XL LVL3 (GOWN DISPOSABLE) ×1 IMPLANT
GOWN STRL REUS W/TWL LRG LVL3 (GOWN DISPOSABLE) ×1
GOWN STRL REUS W/TWL XL LVL3 (GOWN DISPOSABLE) ×1
KIT BASIN OR (CUSTOM PROCEDURE TRAY) ×2 IMPLANT
KIT MARKER MARGIN INK (KITS) ×2 IMPLANT
LIGHT WAVEGUIDE WIDE FLAT (MISCELLANEOUS) IMPLANT
NEEDLE 18GX1X1/2 (RX/OR ONLY) (NEEDLE) ×2 IMPLANT
NEEDLE FILTER BLUNT 18X 1/2SAF (NEEDLE) ×1
NEEDLE FILTER BLUNT 18X1 1/2 (NEEDLE) ×1 IMPLANT
NEEDLE HYPO 25GX1X1/2 BEV (NEEDLE) ×4 IMPLANT
NS IRRIG 1000ML POUR BTL (IV SOLUTION) ×2 IMPLANT
PACK GENERAL/GYN (CUSTOM PROCEDURE TRAY) ×2 IMPLANT
PACK SURGICAL SETUP 50X90 (CUSTOM PROCEDURE TRAY) IMPLANT
PENCIL BUTTON HOLSTER BLD 10FT (ELECTRODE) IMPLANT
SPONGE LAP 18X18 X RAY DECT (DISPOSABLE) IMPLANT
SUT MNCRL AB 4-0 PS2 18 (SUTURE) ×4 IMPLANT
SUT VIC AB 3-0 SH 18 (SUTURE) ×4 IMPLANT
SYR BULB 3OZ (MISCELLANEOUS) IMPLANT
SYR CONTROL 10ML LL (SYRINGE) ×4 IMPLANT
TOWEL OR 17X24 6PK STRL BLUE (TOWEL DISPOSABLE) ×2 IMPLANT
TOWEL OR 17X26 10 PK STRL BLUE (TOWEL DISPOSABLE) IMPLANT
TUBE CONNECTING 12X1/4 (SUCTIONS) IMPLANT
YANKAUER SUCT BULB TIP NO VENT (SUCTIONS) IMPLANT

## 2018-10-03 NOTE — Transfer of Care (Signed)
Immediate Anesthesia Transfer of Care Note  Patient: [name]  Procedure(s) Performed: RIGHT BREAST LUMPECTOMY WITH RADIOACTIVE SEED AND SENTINEL LYMPH NODE MAPPING (Right Breast)  Patient Location: PACU  Anesthesia Type:General  Level of Consciousness: drowsy  Airway & Oxygen Therapy: Patient Spontanous Breathing and Patient connected to face mask oxygen  Post-op Assessment: Report given to RN and Post -op Vital signs reviewed and stable  Post vital signs: Reviewed and stable  Last Vitals:  Vitals Value Taken Time  BP 136/75 1122  Temp    Pulse 77 10/03/2018 11:21 AM  Resp 20 10/03/2018 11:21 AM  SpO2 97 % 10/03/2018 11:21 AM  Vitals shown include unvalidated device data.  Last Pain:  Vitals:   10/03/18 0828  TempSrc:   PainSc: 0-No pain      Patients Stated Pain Goal: 3 (74/[id number])  Complications: No apparent anesthesia complications

## 2018-10-03 NOTE — Discharge Instructions (Signed)
[facility] °Office Phone Number [phone] ° °BREAST BIOPSY/ PARTIAL MASTECTOMY: POST OP INSTRUCTIONS ° °Always review your discharge instruction sheet given to you by the facility where your surgery was performed. ° °IF YOU HAVE DISABILITY OR FAMILY LEAVE FORMS, YOU MUST BRING THEM TO THE OFFICE FOR PROCESSING.  DO NOT GIVE THEM TO YOUR DOCTOR. ° °1. A prescription for pain medication may be given to you upon discharge.  Take your pain medication as prescribed, if needed.  If narcotic pain medicine is not needed, then you may take acetaminophen (Tylenol) or ibuprofen (Advil) as needed. °2. Take your usually prescribed medications unless otherwise directed °3. If you need a refill on your pain medication, please contact your pharmacy.  They will contact our office to request authorization.  Prescriptions will not be filled after 5pm or on week-ends. °4. You should eat very light the first 24 hours after surgery, such as soup, crackers, pudding, etc.  Resume your normal diet the day after surgery. °5. Most patients will experience some swelling and bruising in the breast.  Ice packs and a good support bra will help.  Swelling and bruising can take several days to resolve.  °6. It is common to experience some constipation if taking pain medication after surgery.  Increasing fluid intake and taking a stool softener will usually help or prevent this problem from occurring.  A mild laxative (Milk of Magnesia or Miralax) should be taken according to package directions if there are no bowel movements after 48 hours. °7. Unless discharge instructions indicate otherwise, you may remove your bandages 24-48 hours after surgery, and you may shower at that time.  You may have steri-strips (small skin tapes) in place directly over the incision.  These strips should be left on the skin for 7-10 days.  If your surgeon used skin glue on the incision, you may shower in 24 hours.  The glue will flake off over the  next 2-3 weeks.  Any sutures or staples will be removed at the office during your follow-up visit. °8. ACTIVITIES:  You may resume regular daily activities (gradually increasing) beginning the next day.  Wearing a good support bra or sports bra minimizes pain and swelling.  You may have sexual intercourse when it is comfortable. °a. You may drive when you no longer are taking prescription pain medication, you can comfortably wear a seatbelt, and you can safely maneuver your car and apply brakes. °b. RETURN TO WORK:  ______________________________________________________________________________________ °9. You should see your doctor in the office for a follow-up appointment approximately two weeks after your surgery.  Your doctor’s nurse will typically make your follow-up appointment when she calls you with your pathology report.  Expect your pathology report 2-3 business days after your surgery.  You may call to check if you do not hear from us after three days. °10. OTHER INSTRUCTIONS: _______________________________________________________________________________________________ _____________________________________________________________________________________________________________________________________ °_____________________________________________________________________________________________________________________________________ °_____________________________________________________________________________________________________________________________________ ° °WHEN TO CALL YOUR DOCTOR: °1. Fever over 101.0 °2. Nausea and/or vomiting. °3. Extreme swelling or bruising. °4. Continued bleeding from incision. °5. Increased pain, redness, or drainage from the incision. ° °The clinic staff is available to answer your questions during regular business hours.  Please don’t hesitate to call and ask to speak to one of the nurses for clinical concerns.  If you have a medical emergency, go to the nearest  emergency room or call 911.  A surgeon from [facility] is always on call at the hospital. ° °For further questions, please visit [email or web address]  °

## 2018-10-03 NOTE — Anesthesia Preprocedure Evaluation (Addendum)
Anesthesia Evaluation  Patient identified by MRN, date of birth, ID band Patient awake    Reviewed: Allergy & Precautions, NPO status , Patient's Chart, lab work & pertinent test results  History of Anesthesia Complications Negative for: history of anesthetic complications  Airway Mallampati: II  TM Distance: >3 FB Neck ROM: Full    Dental  (+) Dental Advisory Given   Pulmonary asthma (inhaler last needed 6 months ago) , former smoker,    breath sounds clear to auscultation       Cardiovascular hypertension, Pt. on medications (-) angina Rhythm:Regular Rate:Normal  11/19 ECHO: EF 55-60%, mild MR   Neuro/Psych Anxiety Depression negative neurological ROS     GI/Hepatic negative GI ROS, Neg liver ROS,   Endo/Other  diabetes (glu 228), Oral Hypoglycemic AgentsHypothyroidism   Renal/GU Renal InsufficiencyRenal disease     Musculoskeletal   Abdominal (+) + obese,   Peds  Hematology  (+) Blood dyscrasia (Hb 8.8), anemia , HIV,   Anesthesia Other Findings Breast cancer  Reproductive/Obstetrics                            Anesthesia Physical Anesthesia Plan  ASA: II  Anesthesia Plan: General   Post-op Pain Management:    Induction: Intravenous  PONV Risk Score and Plan: 3 and Ondansetron, Dexamethasone and Scopolamine patch - Pre-op  Airway Management Planned: LMA  Additional Equipment:   Intra-op Plan:   Post-operative Plan:   Informed Consent: I have reviewed the patients History and Physical, chart, labs and discussed the procedure including the risks, benefits and alternatives for the proposed anesthesia with the patient or authorized representative who has indicated his/her understanding and acceptance.   Dental advisory given  Plan Discussed with: CRNA and Surgeon  Anesthesia Plan Comments: (Plan routine monitors, GA- LMA OK, pectoralis block for post op analgesia)         Anesthesia Quick Evaluation

## 2018-10-03 NOTE — Anesthesia Postprocedure Evaluation (Signed)
Anesthesia Post Note  Patient: [name]  Procedure(s) Performed: RIGHT BREAST LUMPECTOMY WITH RADIOACTIVE SEED AND SENTINEL LYMPH NODE MAPPING (Right Breast)     Patient location during evaluation: PACU Anesthesia Type: General and Regional Level of consciousness: awake and alert, patient cooperative and oriented Pain management: pain level controlled Vital Signs Assessment: post-procedure vital signs reviewed and stable Respiratory status: spontaneous breathing, nonlabored ventilation and respiratory function stable Cardiovascular status: blood pressure returned to baseline and stable Postop Assessment: no apparent nausea or vomiting Anesthetic complications: no    Last Vitals:  Vitals:   10/03/18 1350 10/03/18 1400  BP: 132/74 (!) 148/92  Pulse: 68   Resp: 16 16  Temp: (!) 36.4 C   SpO2: 96% 97%    Last Pain:  Vitals:   10/03/18 1400  TempSrc:   PainSc: 0-No pain                 Annalis Kaczmarczyk,E. Mikki Ziff

## 2018-10-03 NOTE — Interval H&P Note (Signed)
History and Physical Interval Note:  10/03/2018 9:15 AM  [name]  has presented today for surgery, with the diagnosis of RIGHT BREAST CANCER  The various methods of treatment have been discussed with the patient and family. After consideration of risks, benefits and other options for treatment, the patient has consented to  Procedure(s): RIGHT BREAST LUMPECTOMY WITH RADIOACTIVE SEED AND SENTINEL LYMPH NODE MAPPING (Right) as a surgical intervention .  The patient's history has been reviewed, patient examined, no change in status, stable for surgery.  I have reviewed the patient's chart and labs.  Questions were answered to the patient's satisfaction.     [place]

## 2018-10-03 NOTE — Anesthesia Procedure Notes (Signed)
Anesthesia Regional Block: Pectoralis block   Pre-Anesthetic Checklist: ,, timeout performed, Correct Patient, Correct Site, Correct Laterality, Correct Procedure, Correct Position, site marked, Risks and benefits discussed,  Surgical consent,  Pre-op evaluation,  At surgeon's request and post-op pain management  Laterality: Right  Prep: chloraprep       Needles:  Injection technique: Single-shot  Needle Type: Echogenic Needle     Needle Length: 9cm  Needle Gauge: 21     Additional Needles:   Procedures:,,,, ultrasound used (permanent image in chart),,,,  Narrative:  Start time: 10/03/2018 9:31 AM End time: 10/03/2018 9:37 AM Injection made incrementally with aspirations every 5 mL.  Performed by: Personally  Anesthesiologist: Annye Asa, MD  Additional Notes: Pt identified in Holding room.  Monitors applied. Working IV access confirmed. Sterile prep, drape R clavicle and pec.  #21ga ECHOgenic needle between pec minor and serratus, between ribs 4,5 with US guidance.  30cc 0.5% Bupivacaine with 1:200k epi injected incrementally after negative test dose, good spread of local anesthetic.  Patient asymptomatic, VSS, no heme aspirated, tolerated well.  Jenita Seashore, MD

## 2018-10-03 NOTE — Op Note (Signed)
Preoperative diagnosis: Stage II right breast cancer  Postop diagnosis: Same  Procedure: Right breast seed localized lumpectomy and right axillary sentinel lymph node mapping of deep base and right axillary nodes with methylene blue dye  Surgeon: Erroll Luna, MD  Anesthesia: LMA with pectoral block and local  EBL: 30 cc  Specimen: Right breast mass with localizing clip and seed verified by Faxitron.  Number next additional superior margin with clip submitted separately  For right axillary sentinel lymph nodes hot and blue  Drains: None  IV fluids: Per anesthesia record  Indications for procedure: The patient is a 52 year old female with stage II right breast cancer.  She is completed chemotherapy and desired breast conservation.  Risk, benefits and other surgical options discussed.  She has significant hidradenitis of both inferior mammary fold as well we discussed the impact of that on radiation as well as reconstruction and future treatment plans.The procedure has been discussed with the patient. Alternatives to surgery have been discussed with the patient.  Risks of surgery include bleeding,  Infection,  Seroma formation, death,  and the need for further surgery.   The patient understands and wishes to proceed.Sentinel lymph node mapping and dissection has been discussed with the patient.  Risk of bleeding,  Infection,  Seroma formation,  Additional procedures,,  Shoulder weakness ,  Shoulder stiffness,  Nerve and blood vessel injury and reaction to the mapping dyes have been discussed.  Alternatives to surgery have been discussed with the patient.  The patient agrees to proceed.   Description of procedure: The patient was met in the holding area.  She underwent right pectoral block anesthesia and injection of the right breast with technetium sulfur colloid.  Neoprobe was used to verify seed location which were 2 in the right central breast.  Questions were answered.  She was taken back  to operating.  She is placed supine upon the operating room table.  After induction of general anesthesia, the right breast was prepped and draped in a sterile fashion.  Timeout was done.  Local anesthetic was infiltrated into the right medial breast and into the right axilla.  Neoprobe was used to identify both seeds in the right medial breast.  Transverse incision was made over the seed location.  Both seeds were excised.  The mass was removed with a clip.  The superior seed was a seed used to bracket the area and I excised this area separately and move the seed separately as well as this was submitted in a separate container.  Hemostasis achieved in the hot cavity.  Faxitron revealed both seed and clip as well as additional seed additional margin to pathology.  Clips used to mark the cavity and this was closed with 3-0 Vicryl and 4-0 Monocryl after ensuring hemostasis.  The right axilla was then interrogated.  5 cc of methylene blue dye were injected in a subareolar position and massaged for 5 minutes.  Transverse incision was made in the right axilla.  Dissection was carried down and 4 blue and hot sentinel nodes were identified and removed.  Background counts approached 0.  These were deep level 1 right axillary nodes.  Hemostasis achieved.  Irrigation used.  Wound closed with 3-0 Vicryl and 4-0 Monocryl.  Dermabond applied.  All final counts found to be correct of sponge, needles and instruments.  The patient was awoke extubated taken to recovery in satisfactory condition.

## 2018-10-03 NOTE — Anesthesia Procedure Notes (Signed)
Procedure Name: LMA Insertion Performed by: Torris House H, CRNA Pre-anesthesia Checklist: Patient identified, Emergency Drugs available, Suction available and Patient being monitored Patient Re-evaluated:Patient Re-evaluated prior to induction Oxygen Delivery Method: Circle System Utilized Preoxygenation: Pre-oxygenation with 100% oxygen Induction Type: IV induction LMA: LMA inserted LMA Size: 4.0 Number of attempts: 1 Airway Equipment and Method: Bite block Placement Confirmation: positive ETCO2 Tube secured with: Tape Dental Injury: Teeth and Oropharynx as per pre-operative assessment        

## 2018-10-04 ENCOUNTER — Encounter (HOSPITAL_COMMUNITY): Payer: Self-pay | Admitting: Surgery

## 2018-10-12 ENCOUNTER — Inpatient Hospital Stay: Payer: Managed Care, Other (non HMO)

## 2018-10-12 ENCOUNTER — Inpatient Hospital Stay: Payer: Managed Care, Other (non HMO) | Admitting: Adult Health

## 2018-10-12 NOTE — Progress Notes (Deleted)
[place]  [phone] [phone]     ID: [name] DOB: December 02, 1965  MR#: [id number]  KGM#:[id number]  Patient Care Team: [name], NP as PCP - General (Internal Medicine) Michel Bickers, MD as PCP - Infectious Diseases (Infectious Diseases) Magrinat, Virgie Dad, MD as Consulting Physician (Oncology) Kyung Rudd, MD as Consulting Physician (Radiation Oncology) OTHER MD:   CHIEF COMPLAINT: Triple positive breast cancer  CURRENT TREATMENT: Neoadjuvant chemotherapy  INTERVAL HISTORY: [name] returns today for follow up and treatment of her triple positive breast cancer.  She is here to receive adjuvant trastuzumab.  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  This remains triple positive.      REVIEW OF SYSTEMS: [place]  HISTORY OF CURRENT ILLNESS: From the original intake note:  [name] (pronounced "Huff") palpated a mass in the right breast for about 2 weeks before bringing it to medical attention. She underwent bilateral diagnostic mammography with tomography and right breast ultrasonography at [facility] on 04/20/2018 showing: breast density category B. There was a highly suspicious mass located at 3 o'clock in the upper inner quadrant measuring 2.9 x 2.5 x 1.6 cm and 4 cm from the nipple. Ultrasound of the right axilla showed 5 lymph nodes with abnormal cortical thickening. The left axilla showed 3 lymph nodes with abnormal cortical thickening.  Accordingly on 04/26/2018 she proceeded to biopsy of the right breast area in question. The pathology from this procedure showed ([id number]): Invasive ductal carcinoma, grade II with calcifications. One right axillary lymph node and one left axillary lymph node were negative for carcinoma. Prognostic indicators significant for: estrogen receptor, 100% positive and progesterone receptor, 80%  positive, both with strong staining intensity. Proliferation marker Ki67 at 70%. HER2 amplified with ratios HER2/CEP17 signals 4.52 and average HER2 copies per cell 10.85  The patient's subsequent history is as detailed below.  PAST MEDICAL HISTORY: Past Medical History:  Diagnosis Date  . Anemia    Normocytic  . Anxiety   . Asthma   . Blood dyscrasia   . Bronchitis 2005  . 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 Diabetes mellitus without complication ([place])   . Family history of breast cancer   . Family history of lung cancer   . Family history of prostate cancer   . Gastroenteritis 07/10/07  . GERD 07/24/2006  . GRAVE'S DISEASE 01/01/2008  . History of hidradenitis suppurativa   . History of kidney stones   . History of thrush   . HIV DISEASE 07/24/2006   dx March 05  . HYPERTENSION 07/24/2006  . Hyperthyroidism 08/2006   Grave's Disease -diffuse radiotracer uptake 08/25/06 Thyroid scan-Cold nodule to R lower lobe of thyrorid  . Menometrorrhagia    hx of  . Nephrolithiasis   . 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 Pneumonia 2005  . 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 Sarcoidosis 02/08/2007   dx as a teenager in [place] from abnl CXR. Completed 2 yrs Prednisone after lung bx confirmation. No symptoms since then.  . Thyroid cancer ([place])   . THYROID NODULE, RIGHT 02/08/2007  -2018 thyroid nodules were precancerous but pathology was negative for thyroid cancer.   PAST SURGICAL HISTORY: Past Surgical History:  Procedure Laterality Date  . BREAST LUMPECTOMY WITH RADIOACTIVE SEED AND SENTINEL LYMPH NODE BIOPSY Right 10/03/2018   Procedure: RIGHT BREAST LUMPECTOMY WITH RADIOACTIVE SEED AND SENTINEL LYMPH NODE  MAPPING;  Surgeon: Erroll Luna, MD;  Location: [facility];  Service: General;  Laterality: Right;  . BREAST SURGERY  1997   Breast Reduction   . CYSTOSCOPY W/ URETERAL STENT REMOVAL  11/09/2012    Procedure: CYSTOSCOPY WITH STENT REMOVAL;  Surgeon: Alexis Frock, MD;  Location: [facility];  Service: Urology;  Laterality: Right;  . CYSTOSCOPY WITH RETROGRADE PYELOGRAM, URETEROSCOPY AND STENT PLACEMENT  11/09/2012   Procedure: CYSTOSCOPY WITH RETROGRADE PYELOGRAM, URETEROSCOPY AND STENT PLACEMENT;  Surgeon: Alexis Frock, MD;  Location: [facility];  Service: Urology;  Laterality: Left;  LEFT URETEROSCOPY, STONE MANIPULATION, left STENT exchange   . CYSTOSCOPY WITH STENT PLACEMENT  10/02/2012   Procedure: CYSTOSCOPY WITH STENT PLACEMENT;  Surgeon: Alexis Frock, MD;  Location: [facility];  Service: Urology;  Laterality: Left;  . DILATION AND CURETTAGE OF UTERUS  Feb 2004   s/p for 1st trimester nonviable pregnancy  . EYE SURGERY     sty under eyelid  . INCISE AND DRAIN ABCESS  Nov 03   s/p I &D for righ inframmary fold hidradenitis  . INCISION AND DRAINAGE PERITONSILLAR ABSCESS  Mar 03  . IR CV LINE INJECTION  06/07/2018  . IR IMAGING GUIDED PORT INSERTION  06/20/2018  . IR REMOVAL TUN ACCESS W/ PORT W/O FL MOD SED  06/20/2018  . IRRIGATION AND DEBRIDEMENT ABSCESS  01/31/2012   Procedure: IRRIGATION AND DEBRIDEMENT ABSCESS;  Surgeon: Shann Medal, MD;  Location: [facility];  Service: General;  Laterality: Right;  right breast and axilla   . NEPHROLITHOTOMY  10/02/2012   Procedure: NEPHROLITHOTOMY PERCUTANEOUS;  Surgeon: Alexis Frock, MD;  Location: [facility];  Service: Urology;  Laterality: Right;  First Stage Percutaneous Nephrolithotomy with Surgeon Access, Left Ureteral Stent    . NEPHROLITHOTOMY  10/04/2012   Procedure: NEPHROLITHOTOMY PERCUTANEOUS SECOND LOOK;  Surgeon: Alexis Frock, MD;  Location: [facility];  Service: Urology;  Laterality: Right;     . NEPHROLITHOTOMY  10/08/2012   Procedure: NEPHROLITHOTOMY PERCUTANEOUS;  Surgeon: Alexis Frock, MD;  Location: [facility];  Service: Urology;  Laterality: Right;  THIRD STAGE, nephrostomy tube exchange x 2  . NEPHROLITHOTOMY  10/11/2012   Procedure:  NEPHROLITHOTOMY PERCUTANEOUS SECOND LOOK;  Surgeon: Alexis Frock, MD;  Location: [facility];  Service: Urology;  Laterality: Right;  RIGHT 4 STAGE PERCUTANOUS NEPHROLITHOTOMY, right URETEROSCOPY WITH HOLMIUM LASER   . PORTACATH PLACEMENT Left 05/17/2018   Procedure: INSERTION PORT-A-CATH;  Surgeon: Coralie Keens, MD;  Location: [facility];  Service: General;  Laterality: Left;  . RADICAL NECK DISSECTION  04/12/2017   limited/notes 04/12/2017  . RADICAL NECK DISSECTION N/A 04/12/2017   Procedure: RADICAL NECK DISSECTION;  Surgeon: Melida Quitter, MD;  Location: [facility];  Service: ENT;  Laterality: N/A;  limited neck dissection 2 hours total  . REDUCTION MAMMAPLASTY Bilateral 1998  . [place]  1994  . THYROIDECTOMY  04/12/2017   completion/notes 04/12/2017  . THYROIDECTOMY N/A 04/12/2017   Procedure: THYROIDECTOMY;  Surgeon: Melida Quitter, MD;  Location: [facility];  Service: ENT;  Laterality: N/A;  Completion Thyroidectomy  . TOTAL THYROIDECTOMY  2010  Thyroid nodules removed- pathology at the ENT doctor 2018 were benign -Over active thyroid and thyroidectomy with benign pathology  FAMILY HISTORY Family History  Problem Relation Age of Onset  . Hypertension Mother   . Cancer Mother        laryngeal  . Heart disease Mother        stent  . Hypertension Father   . Lung cancer  Father 24       hx smoking  . Heart disease Other   . Hypertension Other   . Stroke Other        Grandparent  . Kidney disease Other        Grandparent  . Diabetes Other        FH of Diabetes  . Hypertension Sister   . Cancer Maternal Uncle        Lung CA  . Hypertension Brother   . Hypertension Sister   . Breast cancer Maternal Aunt 65  . Breast cancer Paternal Aunt 64  . Prostate cancer Paternal Uncle   . Breast cancer Maternal Aunt        dx 60+  . Breast cancer Paternal Aunt        dx 39's  . Breast cancer Paternal Aunt        dx 62's  . Prostate cancer Paternal Uncle   . Lung cancer Paternal  Uncle   . Breast cancer Cousin 16  . Breast cancer Cousin        dx <50  . Breast cancer Cousin        dx <50  . Breast cancer Cousin        dx <50  The patient's mother is alive at age 71. The patient's father died at 62 from lung cancer (heavy smoker). She does not know much about her father. The patient has 1 brother and 2 sisters. The patient's mother had a history of laryngeal cancer. There was a maternal cousin with breast cancer diagnosed at age 46. There were 2 maternal aunts with breast cancer, the youngest being diagnosed in the 6's. There was a maternal great aunt with breast cancer. There were 2 paternal aunts with breast cancer.    GYNECOLOGIC HISTORY:  Patient's last menstrual period was 03/31/2014. Menarche: 52 years old Age at first live birth: 52 years old She is GX P1.  Her LMP was in 2017. She did not use HRT.    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 At home is the patient's daughter, Verdie Drown, age 65, who is a Engineer, technical sales education and working part-time at the [employer]. The patient attends [employer].     ADVANCED DIRECTIVES: Not in place; at the 05/10/2018 visit the patient was given the appropriate documents to complete and notarized at her discretion   HEALTH MAINTENANCE: Social History   Tobacco Use  . Smoking status: Former Smoker    Packs/day: 0.50    Years: 15.00    Pack years: 7.50    Types: Cigarettes    Start date: 04/12/2017  . Smokeless tobacco: Never Used  Substance Use Topics  . Alcohol use: Yes    Alcohol/week: 0.0 standard drinks    Comment: social  . Drug use: No     Colonoscopy: no  PAP: [facility]. 2018  Bone density: remote   Allergies  Allergen Reactions  . Genvoya [Elviteg-Cobic-Emtricit-Tenofaf] Hives  . Lisinopril Cough  . Valsartan Other (See Comments) and Cough    Pt states she tolerates medicine now    Current Outpatient Medications  Medication  Sig Dispense Refill  . amLODipine (NORVASC) 10 MG tablet Take 1 tablet (10 mg total) by mouth daily. For blood pressure. 90 tablet 3  . atorvastatin (LIPITOR) 20 MG tablet Take 1 tablet by mouth every evening for high cholesterol. NEED APPOINTMENT FOR ANY MORE REFILLS (Patient taking differently: Take  20 mg by mouth every evening. ) 30 tablet 0  . dexamethasone (DECADRON) 4 MG tablet Take 2 tablets (8 mg total) by mouth 2 (two) times daily. Start the day before Taxotere. Take once the day after, then 2 times a day x 2d. (Patient not taking: Reported on 09/27/2018) 30 tablet 1  . dolutegravir ([name]) 50 MG tablet Take 1 tablet (50 mg total) by mouth daily. 30 tablet 11  . emtricitabine-tenofovir AF (DESCOVY) 200-25 MG tablet Take 1 tablet by mouth daily. 30 tablet 11  . irbesartan-hydrochlorothiazide (AVALIDE) 150-12.5 MG tablet TAKE 1 TABLET BY MOUTH ONCE DAILY FOR BLOOD PRESSURE. (Patient taking differently: Take 1 tablet by mouth daily. ) 90 tablet 1  . levothyroxine (SYNTHROID, LEVOTHROID) 175 MCG tablet TAKE 1 TABLET BY MOUTH EVERY MORNING ON AN EMPTY STOMACH WITH A FULL GLASS OF WATER. (Patient taking differently: Take 175 mcg by mouth daily before breakfast. ) 30 tablet 5  . lidocaine-prilocaine (EMLA) cream Apply to affected area once 30 g 3  . LORazepam (ATIVAN) 0.5 MG tablet TAKE 1 TABLET (0.5 MG TOTAL) BY MOUTH AT BEDTIME AS NEEDED (NAUSEA OR VOMITING). (Patient not taking: Reported on 09/27/2018) 20 tablet 0  . metFORMIN (GLUCOPHAGE) 500 MG tablet Take 1 tablet (500 mg total) by mouth 2 (two) times daily with a meal. 180 tablet 3  . metoprolol succinate (TOPROL-XL) 50 MG 24 hr tablet TAKE 1 TABLET (50 MG TOTAL) BY MOUTH DAILY. TAKE WITH OR IMMEDIATELY FOLLOWING A MEAL. 90 tablet 1  . mometasone (NASONEX) 50 MCG/ACT nasal spray PLACE 2 SPRAYS INTO THE NOSE DAILY. (Patient not taking: Reported on 09/27/2018) 17 g 3  . Multiple Vitamin (MULTIVITAMIN) tablet Take 1 tablet by mouth daily.    .  ONE TOUCH LANCETS MISC Use as instructed to test blood sugar daily (Patient not taking: Reported on 09/27/2018) 200 each 5  . ONETOUCH VERIO test strip USE AS INSTRUCTED TO TEST BLOOD SUGAR DAILY NEED APPOINTMENT FOR ANY MORE REFILLS (Patient not taking: Reported on 09/27/2018) 50 each 0  . oxyCODONE (OXY IR/ROXICODONE) 5 MG immediate release tablet Take 1 tablet (5 mg total) by mouth every 6 (six) hours as needed for severe pain. 12 tablet 0  . potassium chloride (K-DUR) 10 MEQ tablet Take 2 tablets (20 mEq total) by mouth 2 (two) times daily. (Patient taking differently: Take 10 mEq by mouth 2 (two) times daily. ) 20 tablet 0  . PROAIR RESPICLICK 854 (90 Base) MCG/ACT AEPB INHALE 2 PUFFS INTO THE LUNGS EVERY 6 HOURS AS NEEDED FOR WHEEZING OR SHORTNESS OF BREATH. (Patient taking differently: Inhale 2 puffs into the lungs every 6 (six) hours as needed (for shortness of breath or wheezing). ) 1 each 2  . prochlorperazine (COMPAZINE) 10 MG tablet TAKE 1 TABLET BY MOUTH EVERY 6 HOURS AS NEEDED FOR NAUSEA OR VOMITING (Patient not taking: No sig reported) 30 tablet 2  . sulfamethoxazole-trimethoprim (BACTRIM DS,SEPTRA DS) 800-160 MG tablet Take 1 tablet by mouth 2 (two) times daily. 20 tablet 0  . sulfamethoxazole-trimethoprim (BACTRIM DS,SEPTRA DS) 800-160 MG tablet Take 1 tablet by mouth 2 (two) times daily. 20 tablet 1   No current facility-administered medications for this visit.    Facility-Administered Medications Ordered in Other Visits  Medication Dose Route Frequency Provider Last Rate Last Dose  . sodium chloride flush (NS) 0.9 % injection 10 mL  10 mL Intracatheter PRN Magrinat, Virgie Dad, MD   10 mL at 09/19/18 1551    OBJECTIVE:  There  were no vitals filed for this visit.   There is no height or weight on file to calculate BMI.   Wt Readings from Last 3 Encounters:  10/03/18 233 lb (105.7 kg)  09/27/18 235 lb (106.6 kg)  09/19/18 232 lb 8 oz (105.5 kg)  ECOG FS:1 - Symptomatic but  completely ambulatory GENERAL: Patient is a tired appearing woman wearing surgical mask HEENT:  Sclerae anicteric.  Oropharynx clear and moist. No ulcerations or evidence of oropharyngeal candidiasis. Neck is supple.  NODES:  No cervical, supraclavicular, or axillary lymphadenopathy palpated.  BREAST EXAM:  deferred LUNGS:  Clear to auscultation bilaterally.  No wheezes or rhonchi. HEART:  Regular rate and rhythm. No murmur appreciated. ABDOMEN:  Soft, nontender.  Positive, normoactive bowel sounds. No organomegaly palpated. MSK:  No focal spinal tenderness to palpation. Full range of motion bilaterally in the upper extremities. EXTREMITIES:  No peripheral edema.   SKIN:  Clear with no obvious rashes or skin changes. No nail dyscrasia. Dressing over boil in right lower abdominal fold NEURO:  Nonfocal. Well oriented.  Appropriate affect.       LAB RESULTS:  CMP     Component Value Date/Time   NA 140 09/28/2018 1327   K 4.1 09/28/2018 1327   CL 101 09/28/2018 1327   CO2 28 09/28/2018 1327   GLUCOSE 84 09/28/2018 1327   BUN 12 09/28/2018 1327   CREATININE 1.66 (H) 09/28/2018 1327   CREATININE 1.28 (H) 05/10/2018 1458   CREATININE 1.02 09/23/2015 1047   CALCIUM 9.6 09/28/2018 1327   PROT 8.1 09/28/2018 1327   ALBUMIN 3.6 09/28/2018 1327   AST 18 09/28/2018 1327   AST 28 05/10/2018 1458   ALT 17 09/28/2018 1327   ALT 23 05/10/2018 1458   ALKPHOS 139 (H) 09/28/2018 1327   BILITOT 0.2 (L) 09/28/2018 1327   BILITOT <0.2 (L) 05/10/2018 1458   GFRNONAA 36 (L) 09/28/2018 1327   GFRNONAA 48 (L) 05/10/2018 1458   GFRAA 41 (L) 09/28/2018 1327   GFRAA 56 (L) 05/10/2018 1458    No results found for: TOTALPROTELP, ALBUMINELP, A1GS, A2GS, BETS, BETA2SER, GAMS, MSPIKE, SPEI  No results found for: KPAFRELGTCHN, LAMBDASER, KAPLAMBRATIO  Lab Results  Component Value Date   WBC 11.0 (H) 09/28/2018   NEUTROABS 7.5 09/28/2018   HGB 8.8 (L) 09/28/2018   HCT 29.5 (L) 09/28/2018   MCV  99.3 09/28/2018   PLT 213 09/28/2018    _0 @  No results found for: LABCA2  No components found for: [id number]  No results for input(s): INR in the last 168 hours.  No results found for: LABCA2  No results found for: CBS496  No results found for: PRF163  No results found for: WGY659  No results found for: CA2729  No components found for: HGQUANT  No results found for: CEA1 / No results found for: CEA1   No results found for: AFPTUMOR  No results found for: CHROMOGRNA  No results found for: PSA1  No visits with results within 3 Day(s) from this visit.  Latest known visit with results is:  Admission on 10/03/2018, Discharged on 10/03/2018  Component Date Value Ref Range Status  . Glucose-Capillary 10/03/2018 228* 70 - 99 mg/dL Final  . Comment 1 10/03/2018 Notify RN   Final  . Comment 2 10/03/2018 Document in Chart   Final  . Glucose-Capillary 10/03/2018 92  70 - 99 mg/dL Final    (this displays the last labs from the last 3 days)  No results found  for: TOTALPROTELP, ALBUMINELP, A1GS, A2GS, BETS, BETA2SER, GAMS, MSPIKE, SPEI (this displays SPEP labs)  No results found for: KPAFRELGTCHN, LAMBDASER, KAPLAMBRATIO (kappa/lambda light chains)  No results found for: HGBA, HGBA2QUANT, HGBFQUANT, HGBSQUAN (Hemoglobinopathy evaluation)   No results found for: LDH  Lab Results  Component Value Date   IRON 26 (L) 02/08/2007   TIBC 371 02/08/2007   IRONPCTSAT 7 (L) 02/08/2007   (Iron and TIBC)  No results found for: FERRITIN  Urinalysis    Component Value Date/Time   COLORURINE YELLOW 10/21/2014 2347   APPEARANCEUR CLOUDY (A) 10/21/2014 2347   LABSPEC 1.014 10/21/2014 2347   PHURINE 6.0 10/21/2014 2347   GLUCOSEU NEGATIVE 10/21/2014 2347   GLUCOSEU NEG mg/dL 01/23/2008 1418   HGBUR LARGE (A) 10/21/2014 2347   BILIRUBINUR negative 12/29/2017 1046   KETONESUR NEGATIVE 10/21/2014 2347   PROTEINUR 2+ 12/29/2017 1046   PROTEINUR 100 (A) 10/21/2014  2347   UROBILINOGEN 0.2 12/29/2017 1046   UROBILINOGEN 0.2 10/21/2014 2347   NITRITE negative 12/29/2017 1046   NITRITE NEGATIVE 10/21/2014 2347   LEUKOCYTESUR Large (3+) (A) 12/29/2017 1046     STUDIES:   ELIGIBLE FOR AVAILABLE RESEARCH PROTOCOL: UPBEAT  ASSESSMENT: 52 y.o. , [place] woman status post right breast upper inner quadrant biopsy 04/26/2018 for a clinical T2 N0, stage Ib invasive ductal carcinoma, grade 2, with an MIB-1 of 70%.  (1) genetics testing 07/03/2018  (2) neoadjuvant chemotherapy will consist of carboplatin, docetaxel, trastuzumab and Pertuzumab given every 21 days x 6, starting 05/31/2018  (a) Pertuzumab discontinued starting with cycle 2 because of diarrhea problems  (3) trastuzumab to continue to total 6 months  (a) echo on 05/16/2018 shows an EF of 60-65%.  (b) echo on 08/27/18 shows an EF 55-60%  (4) definitive surgery on 10/03/2018: IDC grade 1, 1.1 cm, DCIS, margins close but negative, 5 SLN negative.  [id number]  (5) adjuvant radiation to follow surgery  (6) antiestrogens to start at the completion of local treatment  (7) hidradenitis/boils: on doxycycline daily  PLAN: Elliette is not feeling well today.   Emberley will return after Christmas on 12/27 for labs, evaluation, and her next Herceptin.  She knows to call for any questions or concerns prior to her next appt with [place].    A total of (30) minutes of face-to-face time was spent with this patient with greater than 50% of that time in counseling and care-coordination.   Wilber Bihari, NP 10/12/18

## 2018-10-18 ENCOUNTER — Telehealth: Payer: Self-pay | Admitting: Adult Health

## 2018-10-18 NOTE — Telephone Encounter (Signed)
Added lab/port/fu/tx 1/7 (inf log - MX). Spoke with patient.

## 2018-10-23 ENCOUNTER — Inpatient Hospital Stay: Payer: 59

## 2018-10-23 ENCOUNTER — Inpatient Hospital Stay: Payer: 59 | Attending: Oncology | Admitting: Adult Health

## 2018-10-23 ENCOUNTER — Other Ambulatory Visit: Payer: Self-pay | Admitting: Oncology

## 2018-10-23 ENCOUNTER — Encounter: Payer: Self-pay | Admitting: Adult Health

## 2018-10-23 ENCOUNTER — Telehealth: Payer: Self-pay | Admitting: Oncology

## 2018-10-23 VITALS — BP 136/85 | HR 77 | Temp 98.3°F | Resp 18 | Ht 65.0 in | Wt 231.6 lb

## 2018-10-23 DIAGNOSIS — C73 Malignant neoplasm of thyroid gland: Secondary | ICD-10-CM

## 2018-10-23 DIAGNOSIS — I10 Essential (primary) hypertension: Secondary | ICD-10-CM

## 2018-10-23 DIAGNOSIS — Z801 Family history of malignant neoplasm of trachea, bronchus and lung: Secondary | ICD-10-CM

## 2018-10-23 DIAGNOSIS — C50311 Malignant neoplasm of lower-inner quadrant of right female breast: Secondary | ICD-10-CM

## 2018-10-23 DIAGNOSIS — Z170 Estrogen receptor positive status [ER+]: Principal | ICD-10-CM

## 2018-10-23 DIAGNOSIS — Z792 Long term (current) use of antibiotics: Secondary | ICD-10-CM | POA: Insufficient documentation

## 2018-10-23 DIAGNOSIS — C50211 Malignant neoplasm of upper-inner quadrant of right female breast: Secondary | ICD-10-CM | POA: Insufficient documentation

## 2018-10-23 DIAGNOSIS — L732 Hidradenitis suppurativa: Secondary | ICD-10-CM

## 2018-10-23 DIAGNOSIS — T8130XA Disruption of wound, unspecified, initial encounter: Secondary | ICD-10-CM

## 2018-10-23 DIAGNOSIS — Z5112 Encounter for antineoplastic immunotherapy: Secondary | ICD-10-CM | POA: Insufficient documentation

## 2018-10-23 DIAGNOSIS — E119 Type 2 diabetes mellitus without complications: Secondary | ICD-10-CM

## 2018-10-23 DIAGNOSIS — Z803 Family history of malignant neoplasm of breast: Secondary | ICD-10-CM

## 2018-10-23 DIAGNOSIS — L0292 Furuncle, unspecified: Secondary | ICD-10-CM

## 2018-10-23 DIAGNOSIS — Z21 Asymptomatic human immunodeficiency virus [HIV] infection status: Secondary | ICD-10-CM

## 2018-10-23 DIAGNOSIS — Z808 Family history of malignant neoplasm of other organs or systems: Secondary | ICD-10-CM

## 2018-10-23 DIAGNOSIS — Z95828 Presence of other vascular implants and grafts: Secondary | ICD-10-CM

## 2018-10-23 DIAGNOSIS — Z87891 Personal history of nicotine dependence: Secondary | ICD-10-CM

## 2018-10-23 LAB — COMPREHENSIVE METABOLIC PANEL
ALT: 11 U/L (ref 0–44)
AST: 14 U/L — ABNORMAL LOW (ref 15–41)
Albumin: 3.3 g/dL — ABNORMAL LOW (ref 3.5–5.0)
Alkaline Phosphatase: 94 U/L (ref 38–126)
Anion gap: 9 (ref 5–15)
BUN: 18 mg/dL (ref 6–20)
CO2: 27 mmol/L (ref 22–32)
Calcium: 9.3 mg/dL (ref 8.9–10.3)
Chloride: 105 mmol/L (ref 98–111)
Creatinine, Ser: 1.28 mg/dL — ABNORMAL HIGH (ref 0.44–1.00)
GFR calc Af Amer: 56 mL/min — ABNORMAL LOW (ref >60)
GFR calc non Af Amer: 49 mL/min — ABNORMAL LOW (ref >60)
Glucose, Bld: 102 mg/dL — ABNORMAL HIGH (ref 70–99)
Potassium: 3.7 mmol/L (ref 3.5–5.1)
Sodium: 141 mmol/L (ref 135–145)
Total Bilirubin: 0.3 mg/dL (ref 0.3–1.2)
Total Protein: 8.1 g/dL (ref 6.5–8.1)

## 2018-10-23 LAB — CBC WITH DIFFERENTIAL/PLATELET
Abs Immature Granulocytes: 0.03 10*3/uL (ref 0.00–0.07)
Basophils Absolute: 0 10*3/uL (ref 0.0–0.1)
Basophils Relative: 0 %
Eosinophils Absolute: 0.1 10*3/uL (ref 0.0–0.5)
Eosinophils Relative: 2 %
HCT: 29.8 % — ABNORMAL LOW (ref 36.0–46.0)
HEMOGLOBIN: 9.5 g/dL — AB (ref 12.0–15.0)
Immature Granulocytes: 1 %
LYMPHS PCT: 38 %
Lymphs Abs: 2.2 10*3/uL (ref 0.7–4.0)
MCH: 30.8 pg (ref 26.0–34.0)
MCHC: 31.9 g/dL (ref 30.0–36.0)
MCV: 96.8 fL (ref 80.0–100.0)
Monocytes Absolute: 0.6 10*3/uL (ref 0.1–1.0)
Monocytes Relative: 10 %
NEUTROS ABS: 2.9 10*3/uL (ref 1.7–7.7)
Neutrophils Relative %: 49 %
Platelets: 280 10*3/uL (ref 150–400)
RBC: 3.08 MIL/uL — ABNORMAL LOW (ref 3.87–5.11)
RDW: 19.8 % — ABNORMAL HIGH (ref 11.5–15.5)
WBC: 5.8 10*3/uL (ref 4.0–10.5)
nRBC: 0 % (ref 0.0–0.2)

## 2018-10-23 MED ORDER — HEPARIN SOD (PORK) LOCK FLUSH 100 UNIT/ML IV SOLN
500.0000 [IU] | Freq: Once | INTRAVENOUS | Status: AC | PRN
  Administered 2018-10-23: 500 [IU]
  Filled 2018-10-23: qty 5

## 2018-10-23 MED ORDER — DIPHENHYDRAMINE HCL 25 MG PO CAPS
ORAL_CAPSULE | ORAL | Status: AC
  Filled 2018-10-23: qty 1

## 2018-10-23 MED ORDER — TRASTUZUMAB CHEMO 150 MG IV SOLR
6.0000 mg/kg | Freq: Once | INTRAVENOUS | Status: AC
  Administered 2018-10-23: 672 mg via INTRAVENOUS
  Filled 2018-10-23: qty 32

## 2018-10-23 MED ORDER — SODIUM CHLORIDE 0.9% FLUSH
10.0000 mL | INTRAVENOUS | Status: DC | PRN
  Administered 2018-10-23: 10 mL
  Filled 2018-10-23: qty 10

## 2018-10-23 MED ORDER — SODIUM CHLORIDE 0.9 % IV SOLN
Freq: Once | INTRAVENOUS | Status: AC
  Administered 2018-10-23: 15:00:00 via INTRAVENOUS
  Filled 2018-10-23: qty 250

## 2018-10-23 MED ORDER — ACETAMINOPHEN 325 MG PO TABS
650.0000 mg | ORAL_TABLET | Freq: Once | ORAL | Status: AC
  Administered 2018-10-23: 650 mg via ORAL

## 2018-10-23 MED ORDER — ACETAMINOPHEN 325 MG PO TABS
ORAL_TABLET | ORAL | Status: AC
  Filled 2018-10-23: qty 2

## 2018-10-23 MED ORDER — DIPHENHYDRAMINE HCL 25 MG PO CAPS
25.0000 mg | ORAL_CAPSULE | Freq: Once | ORAL | Status: AC
  Administered 2018-10-23: 25 mg via ORAL

## 2018-10-23 NOTE — Patient Instructions (Signed)
Ado-Trastuzumab Emtansine for injection What is this medicine? ADO-TRASTUZUMAB EMTANSINE (ADD oh traz TOO zuh mab em TAN zine) is a monoclonal antibody combined with chemotherapy. It is used to treat breast cancer. This medicine may be used for other purposes; ask your health care provider or pharmacist if you have questions. COMMON BRAND NAME(S): Kadcyla What should I tell my health care provider before I take this medicine? They need to know if you have any of these conditions: -heart disease -heart failure -infection (especially a virus infection such as chickenpox, cold sores, or herpes) -liver disease -lung or breathing disease, like asthma -an unusual or allergic reaction to ado-trastuzumab emtansine, other medications, foods, dyes, or preservatives -pregnant or trying to get pregnant -breast-feeding How should I use this medicine? This medicine is for infusion into a vein. It is given by a health care professional in a hospital or clinic setting. Talk to your pediatrician regarding the use of this medicine in children. Special care may be needed. Overdosage: If you think you have taken too much of this medicine contact a poison control center or emergency room at once. NOTE: This medicine is only for you. Do not share this medicine with others. What if I miss a dose? It is important not to miss your dose. Call your doctor or health care professional if you are unable to keep an appointment. What may interact with this medicine? This medicine may also interact with the following medications: -atazanavir -boceprevir -clarithromycin -delavirdine -indinavir -dalfopristin; quinupristin -isoniazid, INH -itraconazole -ketoconazole -nefazodone -nelfinavir -ritonavir -telaprevir -telithromycin -tipranavir -voriconazole This list may not describe all possible interactions. Give your health care provider a list of all the medicines, herbs, non-prescription drugs, or dietary  supplements you use. Also tell them if you smoke, drink alcohol, or use illegal drugs. Some items may interact with your medicine. What should I watch for while using this medicine? Visit your doctor for checks on your progress. This drug may make you feel generally unwell. This is not uncommon, as chemotherapy can affect healthy cells as well as cancer cells. Report any side effects. Continue your course of treatment even though you feel ill unless your doctor tells you to stop. You may need blood work done while you are taking this medicine. Call your doctor or health care professional for advice if you get a fever, chills or sore throat, or other symptoms of a cold or flu. Do not treat yourself. This drug decreases your body's ability to fight infections. Try to avoid being around people who are sick. Be careful brushing and flossing your teeth or using a toothpick because you may get an infection or bleed more easily. If you have any dental work done, tell your dentist you are receiving this medicine. Avoid taking products that contain aspirin, acetaminophen, ibuprofen, naproxen, or ketoprofen unless instructed by your doctor. These medicines may hide a fever. Do not become pregnant while taking this medicine or for 7 months after stopping it, men with female partners should use contraception during treatment and for 4 months after the last dose. Women should inform their doctor if they wish to become pregnant or think they might be pregnant. There is a potential for serious side effects to an unborn child. Do not breast-feed an infant while taking this medicine or for 7 months after the last dose. Men who have a partner who is pregnant or who is capable of becoming pregnant should use a condom during sexual activity while taking this medicine and for   4 months after stopping it. Men should inform their doctors if they wish to father a child. This medicine may lower sperm counts. Talk to your health care  professional or pharmacist for more information. What side effects may I notice from receiving this medicine? Side effects that you should report to your doctor or health care professional as soon as possible: -allergic reactions like skin rash, itching or hives, swelling of the face, lips, or tongue -breathing problems -chest pain or palpitations -fever or chills, sore throat -general ill feeling or flu-like symptoms -light-colored stools -nausea, vomiting -pain, tingling, numbness in the hands or feet -signs and symptoms of bleeding such as bloody or black, tarry stools; red or dark-brown urine; spitting up blood or brown material that looks like coffee grounds; red spots on the skin; unusual bruising or bleeding from the eye, gums, or nose -swelling of the legs or ankles -yellowing of the eyes or skin Side effects that usually do not require medical attention (report to your doctor or health care professional if they continue or are bothersome): -changes in taste -constipation -dizziness -headache -joint pain -muscle pain -trouble sleeping -unusually weak or tired This list may not describe all possible side effects. Call your doctor for medical advice about side effects. You may report side effects to FDA at 1-800-FDA-1088. Where should I keep my medicine? This drug is given in a hospital or clinic and will not be stored at home. NOTE: This sheet is a summary. It may not cover all possible information. If you have questions about this medicine, talk to your doctor, pharmacist, or health care provider.  2019 [employer] (2015-11-23 12:11:06)

## 2018-10-23 NOTE — Addendum Note (Signed)
Addended by: Chauncey Cruel on: 10/23/2018 04:51 PM   Modules accepted: Level of Service

## 2018-10-23 NOTE — Patient Instructions (Signed)
[name] Discharge Instructions for Patients Receiving Chemotherapy  Today you received the following chemotherapy agents Herceptin  To help prevent nausea and vomiting after your treatment, we encourage you to take your nausea medication as directed   If you develop nausea and vomiting that is not controlled by your nausea medication, call the clinic.   BELOW ARE SYMPTOMS THAT SHOULD BE REPORTED IMMEDIATELY:  *FEVER GREATER THAN 100.5 F  *CHILLS WITH OR WITHOUT FEVER  NAUSEA AND VOMITING THAT IS NOT CONTROLLED WITH YOUR NAUSEA MEDICATION  *UNUSUAL SHORTNESS OF BREATH  *UNUSUAL BRUISING OR BLEEDING  TENDERNESS IN MOUTH AND THROAT WITH OR WITHOUT PRESENCE OF ULCERS  *URINARY PROBLEMS  *BOWEL PROBLEMS  UNUSUAL RASH Items with * indicate a potential emergency and should be followed up as soon as possible.  Feel free to call the clinic should you have any questions or concerns. The clinic phone number is [phone].  Please show the CHEMO ALERT CARD at check-in to the Emergency Department and triage nurse.   

## 2018-10-23 NOTE — Telephone Encounter (Signed)
Gave avs and calendar ° °

## 2018-10-23 NOTE — Progress Notes (Addendum)
[name]  [phone] [phone]     ID: [name] DOB: 1966/07/18  MR#: [id number]  IOE#:[id number]  Patient Care Team: [name], NP as PCP - General (Internal Medicine) Michel Bickers, MD as PCP - Infectious Diseases (Infectious Diseases) Magrinat, Virgie Dad, MD as Consulting Physician (Oncology) Kyung Rudd, MD as Consulting Physician (Radiation Oncology) OTHER MD:   CHIEF COMPLAINT: Triple positive breast cancer  CURRENT TREATMENT: Adjuvant immunotherapy  INTERVAL HISTORY: [name] returns today for follow up and treatment of her triple positive breast cancer.  She is here to receive adjuvant trastuzumab.    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  This remains triple positive.    REVIEW OF SYSTEMS: [name] is having some pain at her surgical site, particularly in her right axilla.  She was seen for a post op check by Dr. Brantley Stage yesterday.  She has had some separation at her surgical site.  He restarted her doxycycline, and prescribed percocet and Gabapentin for the pain.  She hasn't started these meds yet.  She does note that she has been taking Tramadol for the pain and that has been helping.    She is doing well otherwise.  She denies fevers or chills.  She has no residual peripheral neuropathy.  She is without abdominal issues, bowel/bladder changes, nausea or vomiting.  She denies chest pain, palpitations, cough, or shortness of breath.  Her port is working without any issues.    HISTORY OF CURRENT ILLNESS: From the original intake note:  [name] (pronounced "Huff") palpated a mass in the right breast for about 2 weeks before bringing it to medical attention. She underwent bilateral diagnostic mammography with tomography and right breast ultrasonography at [facility] on 04/20/2018 showing: breast density category B.  There was a highly suspicious mass located at 3 o'clock in the upper inner quadrant measuring 2.9 x 2.5 x 1.6 cm and 4 cm from the nipple. Ultrasound of the right axilla showed 5 lymph nodes with abnormal cortical thickening. The left axilla showed 3 lymph nodes with abnormal cortical thickening.  Accordingly on 04/26/2018 she proceeded to biopsy of the right breast area in question. The pathology from this procedure showed ([id number]): Invasive ductal carcinoma, grade II with calcifications. One right axillary lymph node and one left axillary lymph node were negative for carcinoma. Prognostic indicators significant for: estrogen receptor, 100% positive and progesterone receptor, 80% positive, both with strong staining intensity. Proliferation marker Ki67 at 70%. HER2 amplified with ratios HER2/CEP17 signals 4.52 and average HER2 copies per cell 10.85  The patient's subsequent history is as detailed below.  PAST MEDICAL HISTORY: Past Medical History:  Diagnosis Date  . Anemia    Normocytic  . Anxiety   . Asthma   . Blood dyscrasia   . Bronchitis 2005  . 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 Diabetes mellitus without complication ([place])   . Family history of breast cancer   . Family history of lung cancer   . Family history of prostate cancer   . Gastroenteritis 07/10/07  . GERD 07/24/2006  . GRAVE'S DISEASE 01/01/2008  . History of hidradenitis suppurativa   . History of kidney stones   . History of thrush   . HIV DISEASE 07/24/2006   dx March 05  . HYPERTENSION 07/24/2006  . Hyperthyroidism 08/2006   Grave's Disease -diffuse radiotracer uptake 08/25/06 Thyroid scan-Cold nodule to R lower lobe of thyrorid  .  Menometrorrhagia    hx of  . Nephrolithiasis   . 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 Pneumonia 2005  . 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 Sarcoidosis 02/08/2007   dx as a teenager in [place] from  abnl CXR. Completed 2 yrs Prednisone after lung bx confirmation. No symptoms since then.  . Thyroid cancer ([place])   . THYROID NODULE, RIGHT 02/08/2007  -2018 thyroid nodules were precancerous but pathology was negative for thyroid cancer.   PAST SURGICAL HISTORY: Past Surgical History:  Procedure Laterality Date  . BREAST LUMPECTOMY WITH RADIOACTIVE SEED AND SENTINEL LYMPH NODE BIOPSY Right 10/03/2018   Procedure: RIGHT BREAST LUMPECTOMY WITH RADIOACTIVE SEED AND SENTINEL LYMPH NODE MAPPING;  Surgeon: Erroll Luna, MD;  Location: [facility];  Service: General;  Laterality: Right;  . BREAST SURGERY  1997   Breast Reduction   . CYSTOSCOPY W/ URETERAL STENT REMOVAL  11/09/2012   Procedure: CYSTOSCOPY WITH STENT REMOVAL;  Surgeon: Alexis Frock, MD;  Location: [facility];  Service: Urology;  Laterality: Right;  . CYSTOSCOPY WITH RETROGRADE PYELOGRAM, URETEROSCOPY AND STENT PLACEMENT  11/09/2012   Procedure: CYSTOSCOPY WITH RETROGRADE PYELOGRAM, URETEROSCOPY AND STENT PLACEMENT;  Surgeon: Alexis Frock, MD;  Location: [facility];  Service: Urology;  Laterality: Left;  LEFT URETEROSCOPY, STONE MANIPULATION, left STENT exchange   . CYSTOSCOPY WITH STENT PLACEMENT  10/02/2012   Procedure: CYSTOSCOPY WITH STENT PLACEMENT;  Surgeon: Alexis Frock, MD;  Location: [facility];  Service: Urology;  Laterality: Left;  . DILATION AND CURETTAGE OF UTERUS  Feb 2004   s/p for 1st trimester nonviable pregnancy  . EYE SURGERY     sty under eyelid  . INCISE AND DRAIN ABCESS  Nov 03   s/p I &D for righ inframmary fold hidradenitis  . INCISION AND DRAINAGE PERITONSILLAR ABSCESS  Mar 03  . IR CV LINE INJECTION  06/07/2018  . IR IMAGING GUIDED PORT INSERTION  06/20/2018  . IR REMOVAL TUN ACCESS W/ PORT W/O FL MOD SED  06/20/2018  . IRRIGATION AND DEBRIDEMENT ABSCESS  01/31/2012   Procedure: IRRIGATION AND DEBRIDEMENT ABSCESS;  Surgeon: Shann Medal, MD;  Location: [facility];  Service: General;  Laterality: Right;  right breast and  axilla   . NEPHROLITHOTOMY  10/02/2012   Procedure: NEPHROLITHOTOMY PERCUTANEOUS;  Surgeon: Alexis Frock, MD;  Location[facility];  Service: Urology;  Laterality: Right;  First Stage Percutaneous Nephrolithotomy with Surgeon Access, Left Ureteral Stent    . NEPHROLITHOTOMY  10/04/2012   Procedure: NEPHROLITHOTOMY PERCUTANEOUS SECOND LOOK;  Surgeon: Alexis Frock, MD;  [facility];  Service: Urology;  Laterality: Right;     . NEPHROLITHOTOMY  10/08/2012   Procedure: NEPHROLITHOTOMY PERCUTANEOUS;  Surgeon: Alexis Frock, MD;  Location[facility];  Service: Urology;  Laterality: Right;  THIRD STAGE, nephrostomy tube exchange x 2  . NEPHROLITHOTOMY  10/11/2012   Procedure: NEPHROLITHOTOMY PERCUTANEOUS SECOND LOOK;  Surgeon: Alexis Frock, MD;  Location[facility];  Service: Urology;  Laterality: Right;  RIGHT 4 STAGE PERCUTANOUS NEPHROLITHOTOMY, right URETEROSCOPY WITH HOLMIUM LASER   . PORTACATH PLACEMENT Left 05/17/2018   Procedure: INSERTION PORT-A-CATH;  Surgeon: Coralie Keens, MD;  Location: [facility];  Service: General;  Laterality: Left;  . RADICAL NECK DISSECTION  04/12/2017   limited/notes 04/12/2017  . RADICAL NECK DISSECTION N/A 04/12/2017   Procedure: RADICAL NECK DISSECTION;  Surgeon: Melida Quitter, MD;  Location: [facility];  Service: ENT;  Laterality: N/A;  limited neck dissection 2  hours total  . REDUCTION MAMMAPLASTY Bilateral 1998  . [place]  1994  . THYROIDECTOMY  04/12/2017   completion/notes 04/12/2017  . THYROIDECTOMY N/A 04/12/2017   Procedure: THYROIDECTOMY;  Surgeon: Melida Quitter, MD;  Location: [facility];  Service: ENT;  Laterality: N/A;  Completion Thyroidectomy  . TOTAL THYROIDECTOMY  2010  Thyroid nodules removed- pathology at the ENT doctor 2018 were benign -Over active thyroid and thyroidectomy with benign pathology  FAMILY HISTORY Family History  Problem Relation Age of Onset  . Hypertension Mother   . Cancer Mother        laryngeal  . Heart disease  Mother        stent  . Hypertension Father   . Lung cancer Father 8       hx smoking  . Heart disease Other   . Hypertension Other   . Stroke Other        Grandparent  . Kidney disease Other        Grandparent  . Diabetes Other        FH of Diabetes  . Hypertension Sister   . Cancer Maternal Uncle        Lung CA  . Hypertension Brother   . Hypertension Sister   . Breast cancer Maternal Aunt 65  . Breast cancer Paternal Aunt 30  . Prostate cancer Paternal Uncle   . Breast cancer Maternal Aunt        dx 60+  . Breast cancer Paternal Aunt        dx 21's  . Breast cancer Paternal Aunt        dx 15's  . Prostate cancer Paternal Uncle   . Lung cancer Paternal Uncle   . Breast cancer Cousin 29  . Breast cancer Cousin        dx <50  . Breast cancer Cousin        dx <50  . Breast cancer Cousin        dx <50  The patient's mother is alive at age 58. The patient's father died at 57 from lung cancer (heavy smoker). She does not know much about her father. The patient has 1 brother and 2 sisters. The patient's mother had a history of laryngeal cancer. There was a maternal cousin with breast cancer diagnosed at age 52. There were 2 maternal aunts with breast cancer, the youngest being diagnosed in the 70's. There was a maternal great aunt with breast cancer. There were 2 paternal aunts with breast cancer.    GYNECOLOGIC HISTORY:  Patient's last menstrual period was 03/31/2014. Menarche: 53 years old Age at first live birth: 53 years old She is GX P1.  Her LMP was in 2017. She did not use HRT.    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 At home is the patient's daughter, [name], age 63, who is a Engineer, technical sales education and working part-time at the [employer]. The patient attends [employer].     ADVANCED DIRECTIVES: Not in place; at the 05/10/2018 visit the patient was given the appropriate documents to complete and notarized at  her discretion   HEALTH MAINTENANCE: Social History   Tobacco Use  . Smoking status: Former Smoker    Packs/day: 0.50    Years: 15.00    Pack years: 7.50    Types: Cigarettes    Start date: 04/12/2017  . Smokeless tobacco: Never Used  Substance Use Topics  . Alcohol use: Yes  Alcohol/week: 0.0 standard drinks    Comment: social  . Drug use: No     Colonoscopy: no  PAP: [facility]. 2018  Bone density: remote   Allergies  Allergen Reactions  . Genvoya [Elviteg-Cobic-Emtricit-Tenofaf] Hives  . Lisinopril Cough  . Valsartan Other (See Comments) and Cough    Pt states she tolerates medicine now    Current Outpatient Medications  Medication Sig Dispense Refill  . amLODipine (NORVASC) 10 MG tablet Take 1 tablet (10 mg total) by mouth daily. For blood pressure. 90 tablet 3  . atorvastatin (LIPITOR) 20 MG tablet Take 1 tablet by mouth every evening for high cholesterol. NEED APPOINTMENT FOR ANY MORE REFILLS (Patient taking differently: Take 20 mg by mouth every evening. ) 30 tablet 0  . dexamethasone (DECADRON) 4 MG tablet Take 2 tablets (8 mg total) by mouth 2 (two) times daily. Start the day before Taxotere. Take once the day after, then 2 times a day x 2d. 30 tablet 1  . dolutegravir ([name]) 50 MG tablet Take 1 tablet (50 mg total) by mouth daily. 30 tablet 11  . emtricitabine-tenofovir AF (DESCOVY) 200-25 MG tablet Take 1 tablet by mouth daily. 30 tablet 11  . irbesartan-hydrochlorothiazide (AVALIDE) 150-12.5 MG tablet TAKE 1 TABLET BY MOUTH ONCE DAILY FOR BLOOD PRESSURE. (Patient taking differently: Take 1 tablet by mouth daily. ) 90 tablet 1  . levothyroxine (SYNTHROID, LEVOTHROID) 175 MCG tablet TAKE 1 TABLET BY MOUTH EVERY MORNING ON AN EMPTY STOMACH WITH A FULL GLASS OF WATER. (Patient taking differently: Take 175 mcg by mouth daily before breakfast. ) 30 tablet 5  . lidocaine-prilocaine (EMLA) cream Apply to affected area once 30 g 3  . LORazepam (ATIVAN) 0.5  MG tablet TAKE 1 TABLET (0.5 MG TOTAL) BY MOUTH AT BEDTIME AS NEEDED (NAUSEA OR VOMITING). 20 tablet 0  . metFORMIN (GLUCOPHAGE) 500 MG tablet Take 1 tablet (500 mg total) by mouth 2 (two) times daily with a meal. 180 tablet 3  . metoprolol succinate (TOPROL-XL) 50 MG 24 hr tablet TAKE 1 TABLET (50 MG TOTAL) BY MOUTH DAILY. TAKE WITH OR IMMEDIATELY FOLLOWING A MEAL. 90 tablet 1  . mometasone (NASONEX) 50 MCG/ACT nasal spray PLACE 2 SPRAYS INTO THE NOSE DAILY. 17 g 3  . Multiple Vitamin (MULTIVITAMIN) tablet Take 1 tablet by mouth daily.    . ONE TOUCH LANCETS MISC Use as instructed to test blood sugar daily 200 each 5  . ONETOUCH VERIO test strip USE AS INSTRUCTED TO TEST BLOOD SUGAR DAILY NEED APPOINTMENT FOR ANY MORE REFILLS 50 each 0  . oxyCODONE (OXY IR/ROXICODONE) 5 MG immediate release tablet Take 1 tablet (5 mg total) by mouth every 6 (six) hours as needed for severe pain. 12 tablet 0  . potassium chloride (K-DUR) 10 MEQ tablet Take 2 tablets (20 mEq total) by mouth 2 (two) times daily. (Patient taking differently: Take 10 mEq by mouth 2 (two) times daily. ) 20 tablet 0  . PROAIR RESPICLICK 989 (90 Base) MCG/ACT AEPB INHALE 2 PUFFS INTO THE LUNGS EVERY 6 HOURS AS NEEDED FOR WHEEZING OR SHORTNESS OF BREATH. (Patient taking differently: Inhale 2 puffs into the lungs every 6 (six) hours as needed (for shortness of breath or wheezing). ) 1 each 2  . prochlorperazine (COMPAZINE) 10 MG tablet TAKE 1 TABLET BY MOUTH EVERY 6 HOURS AS NEEDED FOR NAUSEA OR VOMITING 30 tablet 2  . sulfamethoxazole-trimethoprim (BACTRIM DS,SEPTRA DS) 800-160 MG tablet Take 1 tablet by mouth 2 (two) times  daily. 20 tablet 0  . sulfamethoxazole-trimethoprim (BACTRIM DS,SEPTRA DS) 800-160 MG tablet Take 1 tablet by mouth 2 (two) times daily. 20 tablet 1   No current facility-administered medications for this visit.    Facility-Administered Medications Ordered in Other Visits  Medication Dose Route Frequency Provider Last  Rate Last Dose  . sodium chloride flush (NS) 0.9 % injection 10 mL  10 mL Intracatheter PRN Magrinat, Virgie Dad, MD   10 mL at 09/19/18 1551    OBJECTIVE:  Vitals:   10/23/18 1343  BP: 136/85  Pulse: 77  Resp: 18  Temp: 98.3 F (36.8 C)  SpO2: 99%     Body mass index is 38.54 kg/m.   Wt Readings from Last 3 Encounters:  10/23/18 231 lb 9.6 oz (105.1 kg)  10/03/18 233 lb (105.7 kg)  09/27/18 235 lb (106.6 kg)  ECOG FS:1 - Symptomatic but completely ambulatory GENERAL: Patient is a tired appearing woman wearing surgical mask HEENT:  Sclerae anicteric.  Oropharynx clear and moist. No ulcerations or evidence of oropharyngeal candidiasis. Neck is supple.  NODES:  No cervical, supraclavicular, or axillary lymphadenopathy palpated.  BREAST EXAM:  Right axilla with open wound, dressing in place, right breast lumpectomy site opened draining small amt of serous drainage. LUNGS:  Clear to auscultation bilaterally.  No wheezes or rhonchi. HEART:  Regular rate and rhythm. No murmur appreciated. ABDOMEN:  Soft, nontender.  Positive, normoactive bowel sounds. No organomegaly palpated. MSK:  No focal spinal tenderness to palpation. Full range of motion bilaterally in the upper extremities. EXTREMITIES:  No peripheral edema.   SKIN:  Clear with no obvious rashes or skin changes. No nail dyscrasia. NEURO:  Nonfocal. Well oriented.  Appropriate affect.       LAB RESULTS:  CMP     Component Value Date/Time   NA 140 09/28/2018 1327   K 4.1 09/28/2018 1327   CL 101 09/28/2018 1327   CO2 28 09/28/2018 1327   GLUCOSE 84 09/28/2018 1327   BUN 12 09/28/2018 1327   CREATININE 1.66 (H) 09/28/2018 1327   CREATININE 1.28 (H) 05/10/2018 1458   CREATININE 1.02 09/23/2015 1047   CALCIUM 9.6 09/28/2018 1327   PROT 8.1 09/28/2018 1327   ALBUMIN 3.6 09/28/2018 1327   AST 18 09/28/2018 1327   AST 28 05/10/2018 1458   ALT 17 09/28/2018 1327   ALT 23 05/10/2018 1458   ALKPHOS 139 (H) 09/28/2018  1327   BILITOT 0.2 (L) 09/28/2018 1327   BILITOT <0.2 (L) 05/10/2018 1458   GFRNONAA 36 (L) 09/28/2018 1327   GFRNONAA 48 (L) 05/10/2018 1458   GFRAA 41 (L) 09/28/2018 1327   GFRAA 56 (L) 05/10/2018 1458    No results found for: TOTALPROTELP, ALBUMINELP, A1GS, A2GS, BETS, BETA2SER, GAMS, MSPIKE, SPEI  No results found for: KPAFRELGTCHN, LAMBDASER, KAPLAMBRATIO  Lab Results  Component Value Date   WBC 5.8 10/23/2018   NEUTROABS 2.9 10/23/2018   HGB 9.5 (L) 10/23/2018   HCT 29.8 (L) 10/23/2018   MCV 96.8 10/23/2018   PLT 280 10/23/2018    _0 @  No results found for: LABCA2  No components found for: OZYYQM250  No results for input(s): INR in the last 168 hours.  No results found for: LABCA2  No results found for: IBB048  No results found for: GQB169  No results found for: IHW388  No results found for: CA2729  No components found for: HGQUANT  No results found for: CEA1 / No results found for: CEA1   No  results found for: AFPTUMOR  No results found for: CHROMOGRNA  No results found for: PSA1  Appointment on 10/23/2018  Component Date Value Ref Range Status  . WBC 10/23/2018 5.8  4.0 - 10.5 K/uL Final  . RBC 10/23/2018 3.08* 3.87 - 5.11 MIL/uL Final  . Hemoglobin 10/23/2018 9.5* 12.0 - 15.0 g/dL Final  . HCT 10/23/2018 29.8* 36.0 - 46.0 % Final  . MCV 10/23/2018 96.8  80.0 - 100.0 fL Final  . MCH 10/23/2018 30.8  26.0 - 34.0 pg Final  . MCHC 10/23/2018 31.9  30.0 - 36.0 g/dL Final  . RDW 10/23/2018 19.8* 11.5 - 15.5 % Final  . Platelets 10/23/2018 280  150 - 400 K/uL Final  . nRBC 10/23/2018 0.0  0.0 - 0.2 % Final  . Neutrophils Relative % 10/23/2018 49  % Final  . Neutro Abs 10/23/2018 2.9  1.7 - 7.7 K/uL Final  . Lymphocytes Relative 10/23/2018 38  % Final  . Lymphs Abs 10/23/2018 2.2  0.7 - 4.0 K/uL Final  . Monocytes Relative 10/23/2018 10  % Final  . Monocytes Absolute 10/23/2018 0.6  0.1 - 1.0 K/uL Final  . Eosinophils Relative  10/23/2018 2  % Final  . Eosinophils Absolute 10/23/2018 0.1  0.0 - 0.5 K/uL Final  . Basophils Relative 10/23/2018 0  % Final  . Basophils Absolute 10/23/2018 0.0  0.0 - 0.1 K/uL Final  . Immature Granulocytes 10/23/2018 1  % Final  . Abs Immature Granulocytes 10/23/2018 0.03  0.00 - 0.07 K/uL Final   Performed at [facility], [address] [place], [place] [address]    (this displays the last labs from the last 3 days)  No results found for: TOTALPROTELP, ALBUMINELP, A1GS, A2GS, BETS, BETA2SER, GAMS, MSPIKE, SPEI (this displays SPEP labs)  No results found for: KPAFRELGTCHN, LAMBDASER, KAPLAMBRATIO (kappa/lambda light chains)  No results found for: HGBA, HGBA2QUANT, HGBFQUANT, HGBSQUAN (Hemoglobinopathy evaluation)   No results found for: LDH  Lab Results  Component Value Date   IRON 26 (L) 02/08/2007   TIBC 371 02/08/2007   IRONPCTSAT 7 (L) 02/08/2007   (Iron and TIBC)  No results found for: FERRITIN  Urinalysis    Component Value Date/Time   COLORURINE YELLOW 10/21/2014 2347   APPEARANCEUR CLOUDY (A) 10/21/2014 2347   LABSPEC 1.014 10/21/2014 2347   PHURINE 6.0 10/21/2014 2347   GLUCOSEU NEGATIVE 10/21/2014 2347   GLUCOSEU NEG mg/dL 01/23/2008 1418   HGBUR LARGE (A) 10/21/2014 2347   BILIRUBINUR negative 12/29/2017 [place] 10/21/2014 2347   PROTEINUR 2+ 12/29/2017 1046   PROTEINUR 100 (A) 10/21/2014 2347   UROBILINOGEN 0.2 12/29/2017 1046   UROBILINOGEN 0.2 10/21/2014 2347   NITRITE negative 12/29/2017 1046   NITRITE NEGATIVE 10/21/2014 2347   LEUKOCYTESUR Large (3+) (A) 12/29/2017 1046     STUDIES:   ELIGIBLE FOR AVAILABLE RESEARCH PROTOCOL: UPBEAT  ASSESSMENT: 53 y.o. , [name] woman status post right breast upper inner quadrant biopsy 04/26/2018 for a clinical T2 N0, stage Ib invasive ductal carcinoma, grade 2, with an MIB-1 of 70%.  (1) genetics testing 07/03/2018  (2) neoadjuvant chemotherapy  will consist of carboplatin, docetaxel, trastuzumab and Pertuzumab given every 21 days x 6, starting 05/31/2018  (a) Pertuzumab discontinued starting with cycle 2 because of diarrhea problems  (b) Taxotere stopped after 4 cycles due peripheral neuropathy and substituted for last two cycles with Gemcitabine  (3) trastuzumab adjuvantly today (10/23/18 and during radiation), TDM1 x 8 cycles planned adjuvantly  due to residual disease.  (a) echo on 05/16/2018 shows an EF of 60-65%.  (b) echo on 08/27/18 shows an EF 55-60%  (4) definitive surgery on 10/03/2018: IDC grade 1, 1.1 cm, DCIS, margins close but negative, 5 SLN negative.  [id number]  (5) adjuvant radiation to follow surgery  (6) antiestrogens to start at the completion of local treatment  (7) hidradenitis/boils: on doxycycline daily  PLAN: Nakiesha is doing moderately well today.  She met with myself and Dr. [name] and we reviewed her surgical pathology with her in detail.  She had 1.1cm of residual breast cancer that was triple positive.  She will proceed with Trastuzumab today, however Dr. [name] will start TDM1 in 3 weeks.  Possible adverse effects were reviewed.  She is due to meet with Dr. Lisbeth Renshaw on Thursday.  We reviewed that her radiation will likely be delayed secondary to her delayed wound healing.  She verbalizes understanding about this.  Dr. [name] talked to Yarieliz about avoiding blood pressures and venipunctures on her right arm since she had 5 lymph nodes removed.  He also reviewed her pain.  He suggested she also try one extra strength tylenol and one aleve for pain.    Antonela will return every 3 weeks for labs and treatment.  She will see Dr. [name] again on 12/06/2018.  She knows to call for any questions or concerns prior to her next appointment with [place].     Wilber Bihari, NP 10/23/18    ADDENDUM: NG did very well with her surgery.  She is continuing with anti-HER-2 treatment.  She will receive trastuzumab  today.  The concern of course is that she still had significant disease in the breast.  This was still HER-2 positive.  She would benefit from T-DM1 treatment and we would plan 8 doses.  We do not have data that this can be given concurrently with radiation, and therefore the plan is to start TDM 1, changed back to trastuzumab when she undergoes radiation, and then return to TDM 1 until she receives the required number of treatments.  This means that she may end up with more than 1 year of anti-HER-2 treatment.  She understands this and is agreeable.  Right now her radiation is going to have to be delayed because of significant dehiscence issues  She will return to see [place] in 3 weeks for her first T-DM1 dose.  We did discuss the possible toxicities, side effects and complications of this agent and she will see me specifically with dose #2 to discuss that further.  I personally saw this patient and performed a substantive portion of this encounter with the listed APP documented above.   Chauncey Cruel, MD Medical Oncology and Hematology [facility] [address] [place], [place] [address] Tel. [phone]    Fax. [phone]

## 2018-10-25 ENCOUNTER — Ambulatory Visit
Admission: RE | Admit: 2018-10-25 | Discharge: 2018-10-25 | Disposition: A | Discharge status: Home / Self Care | Payer: 59 | Source: Ambulatory Visit | Attending: Radiation Oncology | Admitting: Radiation Oncology

## 2018-10-25 ENCOUNTER — Encounter: Payer: Self-pay | Admitting: Radiation Oncology

## 2018-10-25 ENCOUNTER — Other Ambulatory Visit: Payer: Self-pay

## 2018-10-25 VITALS — BP 140/84 | HR 75 | Temp 98.4°F | Resp 20 | Wt 231.4 lb

## 2018-10-25 DIAGNOSIS — C50311 Malignant neoplasm of lower-inner quadrant of right female breast: Secondary | ICD-10-CM

## 2018-10-25 DIAGNOSIS — C50911 Malignant neoplasm of unspecified site of right female breast: Secondary | ICD-10-CM

## 2018-10-25 DIAGNOSIS — Z170 Estrogen receptor positive status [ER+]: Principal | ICD-10-CM

## 2018-10-25 HISTORY — DX: Hidradenitis suppurativa: L73.2

## 2018-10-25 MED ORDER — SPIRONOLACTONE 25 MG PO TABS: 25.0000 mg | ORAL_TABLET | Freq: Two times a day (BID) | ORAL | 3 refills | Status: DC

## 2018-10-25 NOTE — Progress Notes (Signed)
Radiation Oncology         [phone] ________________________________  Name: [name]        MRN: [id number]  Date of Service: 10/25/2018 DOB: Jun 12, 1966  EK:CMKLK, Leticia Penna, NP  Erroll Luna, MD     REFERRING PHYSICIAN: Erroll Luna, MD   DIAGNOSIS: The encounter diagnosis was Malignant neoplasm of lower-inner quadrant of right breast of female, estrogen receptor positive ([place]).   HISTORY OF PRESENT ILLNESS: [name] is a 53 y.o. female originally seen in the multidisciplinary breast clinic for a new diagnosis of right breast cancer. The patient was noted to have a history of metastatic papillary thyroid cancer which was diagnosed in 2018.  She recently noted a palpable mass in her right breast; of note she's had prior bilateral reduction.  Diagnostic imaging revealed a mass at 3:00 within the right breast measuring 2.9 x 2.5 x 1.1 cm.  She had palpable axillary adenopathy, and had 5 abnormal lymph nodes in the right axilla, and 3 in the left.  She underwent biopsy on 04/26/2018, her lymph nodes were negative for adenopathy related to cancer but more consistent with a hidradenitis appearance.  Her right breast however revealed a grade 2-3 invasive ductal carcinoma with calcifications, and was triple positive with HER-2 amplification of 4.52 her Ki-67 was 70%.  She went on to receive neoadjuvant chemotherapy, and her post neoadjuvant MRI revealed a radiographic response. She underwent lumpectomy on 10/03/18 and this revealed a 1.1 cm grade 2 invasive ductal carcinoma. Her margins were negative and her 5 sampled nodes were also negative. She comes today to discuss adjuvant radiotherapy and plans to continue Herceptin and Perjeta with Dr. [name].   PREVIOUS RADIATION THERAPY: No   PAST MEDICAL HISTORY:  Past Medical History:  Diagnosis Date  . Anemia    Normocytic  . Anxiety   . Asthma   . Blood dyscrasia   . Bronchitis 2005  . 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 Diabetes mellitus without complication ([place])   . Family history of breast cancer   . Family history of lung cancer   . Family history of prostate cancer   . Gastroenteritis 07/10/07  . GERD 07/24/2006  . GRAVE'S DISEASE 01/01/2008  . History of hidradenitis suppurativa   . History of kidney stones   . History of thrush   . HIV DISEASE 07/24/2006   dx March 05  . HYPERTENSION 07/24/2006  . Hyperthyroidism 08/2006   Grave's Disease -diffuse radiotracer uptake 08/25/06 Thyroid scan-Cold nodule to R lower lobe of thyrorid  . Menometrorrhagia    hx of  . Nephrolithiasis   . 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[place])    METASTATIC PAPILLARY THYROID CARCINOMA/notes 04/12/2017  . Pneumonia 2005  . 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 Sarcoidosis 02/08/2007   dx as a teenager in [place] from abnl CXR. Completed 2 yrs Prednisone after lung bx confirmation. No symptoms since then.  . Thyroid cancer ([place])   . THYROID NODULE, RIGHT 02/08/2007       PAST SURGICAL HISTORY: Past Surgical History:  Procedure Laterality Date  . BREAST LUMPECTOMY WITH RADIOACTIVE SEED AND SENTINEL LYMPH NODE BIOPSY Right 10/03/2018   Procedure: RIGHT BREAST LUMPECTOMY WITH RADIOACTIVE SEED AND SENTINEL LYMPH NODE MAPPING;  Surgeon: Erroll Luna, MD;  Location: [facility];  Service: General;  Laterality: Right;  . BREAST SURGERY  1997   Breast Reduction   . CYSTOSCOPY W/ URETERAL STENT REMOVAL  11/09/2012   Procedure: CYSTOSCOPY WITH STENT REMOVAL;  Surgeon: Alexis Frock, MD;  Location: [facility];  Service: Urology;  Laterality: Right;  . CYSTOSCOPY WITH RETROGRADE PYELOGRAM, URETEROSCOPY AND STENT PLACEMENT  11/09/2012   Procedure: CYSTOSCOPY WITH RETROGRADE PYELOGRAM, URETEROSCOPY AND STENT PLACEMENT;  Surgeon: Alexis Frock, MD;  Location: [facility];  Service: Urology;  Laterality: Left;  LEFT URETEROSCOPY, STONE MANIPULATION, left STENT exchange   . CYSTOSCOPY WITH STENT  PLACEMENT  10/02/2012   Procedure: CYSTOSCOPY WITH STENT PLACEMENT;  Surgeon: Alexis Frock, MD;  Location: [facility];  Service: Urology;  Laterality: Left;  . DILATION AND CURETTAGE OF UTERUS  Feb 2004   s/p for 1st trimester nonviable pregnancy  . EYE SURGERY     sty under eyelid  . INCISE AND DRAIN ABCESS  Nov 03   s/p I &D for righ inframmary fold hidradenitis  . INCISION AND DRAINAGE PERITONSILLAR ABSCESS  Mar 03  . IR CV LINE INJECTION  06/07/2018  . IR IMAGING GUIDED PORT INSERTION  06/20/2018  . IR REMOVAL TUN ACCESS W/ PORT W/O FL MOD SED  06/20/2018  . IRRIGATION AND DEBRIDEMENT ABSCESS  01/31/2012   Procedure: IRRIGATION AND DEBRIDEMENT ABSCESS;  Surgeon: Shann Medal, MD;  Location[facility];  Service: General;  Laterality: Right;  right breast and axilla   . NEPHROLITHOTOMY  10/02/2012   Procedure: NEPHROLITHOTOMY PERCUTANEOUS;  Surgeon: Alexis Frock, MD;  Location[facility];  Service: Urology;  Laterality: Right;  First Stage Percutaneous Nephrolithotomy with Surgeon Access, Left Ureteral Stent    . NEPHROLITHOTOMY  10/04/2012   Procedure: NEPHROLITHOTOMY PERCUTANEOUS SECOND LOOK;  Surgeon: Alexis Frock, MD;  [facility];  Service: Urology;  Laterality: Right;     . NEPHROLITHOTOMY  10/08/2012   Procedure: NEPHROLITHOTOMY PERCUTANEOUS;  Surgeon: Alexis Frock, MD;  [facility];  Service: Urology;  Laterality: Right;  THIRD STAGE, nephrostomy tube exchange x 2  . NEPHROLITHOTOMY  10/11/2012   Procedure: NEPHROLITHOTOMY PERCUTANEOUS SECOND LOOK;  Surgeon: Alexis Frock, MD;  [facility];  Service: Urology;  Laterality: Right;  RIGHT 4 STAGE PERCUTANOUS NEPHROLITHOTOMY, right URETEROSCOPY WITH HOLMIUM LASER   . PORTACATH PLACEMENT Left 05/17/2018   Procedure: INSERTION PORT-A-CATH;  Surgeon: Coralie Keens, MD;  Location: [facility];  Service: General;  Laterality: Left;  . RADICAL NECK DISSECTION  04/12/2017   limited/notes 04/12/2017  . RADICAL NECK  DISSECTION N/A 04/12/2017   Procedure: RADICAL NECK DISSECTION;  Surgeon: Melida Quitter, MD;  Location: [facility];  Service: ENT;  Laterality: N/A;  limited neck dissection 2 hours total  . REDUCTION MAMMAPLASTY Bilateral 1998  . [place]  1994  . THYROIDECTOMY  04/12/2017   completion/notes 04/12/2017  . THYROIDECTOMY N/A 04/12/2017   Procedure: THYROIDECTOMY;  Surgeon: Melida Quitter, MD;  Location: [facility];  Service: ENT;  Laterality: N/A;  Completion Thyroidectomy  . TOTAL THYROIDECTOMY  2010     FAMILY HISTORY:  Family History  Problem Relation Age of Onset  . Hypertension Mother   . Cancer Mother        laryngeal  . Heart disease Mother        stent  . Hypertension Father   . Lung cancer Father 17       hx smoking  . Heart disease Other   . Hypertension Other   . Stroke Other        Grandparent  . Kidney disease Other        Grandparent  . Diabetes Other        FH of Diabetes  .  Hypertension Sister   . Cancer Maternal Uncle        Lung CA  . Hypertension Brother   . Hypertension Sister   . Breast cancer Maternal Aunt 65  . Breast cancer Paternal Aunt 72  . Prostate cancer Paternal Uncle   . Breast cancer Maternal Aunt        dx 60+  . Breast cancer Paternal Aunt        dx 40's  . Breast cancer Paternal Aunt        dx 51's  . Prostate cancer Paternal Uncle   . Lung cancer Paternal Uncle   . Breast cancer Cousin 40  . Breast cancer Cousin        dx <50  . Breast cancer Cousin        dx <50  . Breast cancer Cousin        dx <50     SOCIAL HISTORY:  reports that she has quit smoking. Her smoking use included cigarettes. She started smoking about 18 months ago. She has a 7.50 pack-year smoking history. She has never used smokeless tobacco. She reports current alcohol use. She reports that she does not use drugs. The patient is single and lives in [place].    ALLERGIES: Genvoya [elviteg-cobic-emtricit-tenofaf]; Lisinopril; and Valsartan   MEDICATIONS:  Current  Outpatient Medications  Medication Sig Dispense Refill  . amLODipine (NORVASC) 10 MG tablet Take 1 tablet (10 mg total) by mouth daily. For blood pressure. 90 tablet 3  . atorvastatin (LIPITOR) 20 MG tablet Take 1 tablet by mouth every evening for high cholesterol. NEED APPOINTMENT FOR ANY MORE REFILLS (Patient taking differently: Take 20 mg by mouth every evening. ) 30 tablet 0  . dexamethasone (DECADRON) 4 MG tablet Take 2 tablets (8 mg total) by mouth 2 (two) times daily. Start the day before Taxotere. Take once the day after, then 2 times a day x 2d. 30 tablet 1  . dolutegravir ([name]) 50 MG tablet Take 1 tablet (50 mg total) by mouth daily. 30 tablet 11  . emtricitabine-tenofovir AF (DESCOVY) 200-25 MG tablet Take 1 tablet by mouth daily. 30 tablet 11  . irbesartan-hydrochlorothiazide (AVALIDE) 150-12.5 MG tablet TAKE 1 TABLET BY MOUTH ONCE DAILY FOR BLOOD PRESSURE. (Patient taking differently: Take 1 tablet by mouth daily. ) 90 tablet 1  . levothyroxine (SYNTHROID, LEVOTHROID) 175 MCG tablet TAKE 1 TABLET BY MOUTH EVERY MORNING ON AN EMPTY STOMACH WITH A FULL GLASS OF WATER. (Patient taking differently: Take 175 mcg by mouth daily before breakfast. ) 30 tablet 5  . lidocaine-prilocaine (EMLA) cream Apply to affected area once 30 g 3  . LORazepam (ATIVAN) 0.5 MG tablet TAKE 1 TABLET (0.5 MG TOTAL) BY MOUTH AT BEDTIME AS NEEDED (NAUSEA OR VOMITING). 20 tablet 0  . metFORMIN (GLUCOPHAGE) 500 MG tablet Take 1 tablet (500 mg total) by mouth 2 (two) times daily with a meal. 180 tablet 3  . metoprolol succinate (TOPROL-XL) 50 MG 24 hr tablet TAKE 1 TABLET (50 MG TOTAL) BY MOUTH DAILY. TAKE WITH OR IMMEDIATELY FOLLOWING A MEAL. 90 tablet 1  . mometasone (NASONEX) 50 MCG/ACT nasal spray PLACE 2 SPRAYS INTO THE NOSE DAILY. 17 g 3  . Multiple Vitamin (MULTIVITAMIN) tablet Take 1 tablet by mouth daily.    . ONE TOUCH LANCETS MISC Use as instructed to test blood sugar daily 200 each 5  . ONETOUCH VERIO  test strip USE AS INSTRUCTED TO TEST BLOOD SUGAR DAILY NEED APPOINTMENT FOR ANY  MORE REFILLS 50 each 0  . oxyCODONE (OXY IR/ROXICODONE) 5 MG immediate release tablet Take 1 tablet (5 mg total) by mouth every 6 (six) hours as needed for severe pain. 12 tablet 0  . potassium chloride (K-DUR) 10 MEQ tablet Take 2 tablets (20 mEq total) by mouth 2 (two) times daily. (Patient taking differently: Take 10 mEq by mouth 2 (two) times daily. ) 20 tablet 0  . PROAIR RESPICLICK 993 (90 Base) MCG/ACT AEPB INHALE 2 PUFFS INTO THE LUNGS EVERY 6 HOURS AS NEEDED FOR WHEEZING OR SHORTNESS OF BREATH. (Patient taking differently: Inhale 2 puffs into the lungs every 6 (six) hours as needed (for shortness of breath or wheezing). ) 1 each 2  . prochlorperazine (COMPAZINE) 10 MG tablet TAKE 1 TABLET BY MOUTH EVERY 6 HOURS AS NEEDED FOR NAUSEA OR VOMITING 30 tablet 2  . sulfamethoxazole-trimethoprim (BACTRIM DS,SEPTRA DS) 800-160 MG tablet Take 1 tablet by mouth 2 (two) times daily. 20 tablet 0  . sulfamethoxazole-trimethoprim (BACTRIM DS,SEPTRA DS) 800-160 MG tablet Take 1 tablet by mouth 2 (two) times daily. 20 tablet 1   No current facility-administered medications for this visit.    Facility-Administered Medications Ordered in Other Visits  Medication Dose Route Frequency Provider Last Rate Last Dose  . sodium chloride flush (NS) 0.9 % injection 10 mL  10 mL Intracatheter PRN Magrinat, Virgie Dad, MD   10 mL at 09/19/18 1551     REVIEW OF SYSTEMS: On review of systems, the patient reports that she is doing well overall. She is having some issues with healing still along her axillary incision site and reports she continues to have boils in the inframammary folds. She denies any chest pain, shortness of breath, cough, fevers, chills, night sweats, unintended weight changes. She denies any bowel or bladder disturbances, and denies abdominal pain, nausea or vomiting. She denies any new musculoskeletal or joint aches or pains.  A complete review of systems is obtained and is otherwise negative.     PHYSICAL EXAM:  Wt Readings from Last 3 Encounters:  10/23/18 231 lb 9.6 oz (105.1 kg)  10/03/18 233 lb (105.7 kg)  09/27/18 235 lb (106.6 kg)   Temp Readings from Last 3 Encounters:  10/23/18 98.3 F (36.8 C) (Oral)  10/03/18 (!) 97.5 F (36.4 C)  09/28/18 (P) 98.2 F (36.8 C)   BP Readings from Last 3 Encounters:  10/23/18 136/85  10/03/18 (!) 148/92  09/28/18 (P) 122/76   Pulse Readings from Last 3 Encounters:  10/23/18 77  10/03/18 68  09/28/18 (P) 75     In general this is a well appearing African American female in no acute distress. She is alert and oriented x4 and appropriate throughout the examination. HEENT reveals that the patient is normocephalic, atraumatic. EOMs are intact. Cardiopulmonary assessment is negative for acute distress and she exhibits normal effort. The right breast is examined and her incision site is dressed in an area with suppurative hidradenitis changes. She has complete separation of her axillary incision site. No erythematous changes are noted.   ECOG = 1  0 - Asymptomatic (Fully active, able to carry on all predisease activities without restriction)  1 - Symptomatic but completely ambulatory (Restricted in physically strenuous activity but ambulatory and able to carry out work of a light or sedentary nature. For example, light housework, office work)  2 - Symptomatic, <50% in bed during the day (Ambulatory and capable of all self care but unable to carry out any work activities. Up  and about more than 50% of waking hours)  3 - Symptomatic, >50% in bed, but not bedbound (Capable of only limited self-care, confined to bed or chair 50% or more of waking hours)  4 - Bedbound (Completely disabled. Cannot carry on any self-care. Totally confined to bed or chair)  5 - Death   Eustace Pen MM, Creech RH, Tormey DC, et al. [phone]). "Toxicity and response criteria of the [facility]". [place] Oncol. 5 (6): 649-55    LABORATORY DATA:  Lab Results  Component Value Date   WBC 5.8 10/23/2018   HGB 9.5 (L) 10/23/2018   HCT 29.8 (L) 10/23/2018   MCV 96.8 10/23/2018   PLT 280 10/23/2018   Lab Results  Component Value Date   NA 141 10/23/2018   K 3.7 10/23/2018   CL 105 10/23/2018   CO2 27 10/23/2018   Lab Results  Component Value Date   ALT 11 10/23/2018   AST 14 (L) 10/23/2018   ALKPHOS 94 10/23/2018   BILITOT 0.3 10/23/2018      RADIOGRAPHY: Ct Chest W Contrast  Result Date: 09/27/2018 CLINICAL DATA:  Followup breast cancer. EXAM: CT CHEST WITH CONTRAST TECHNIQUE: Multidetector CT imaging of the chest was performed during intravenous contrast administration. CONTRAST:  25m OMNIPAQUE IOHEXOL 300 MG/ML  SOLN COMPARISON:  None. FINDINGS: Cardiovascular: The heart size is normal. No pericardial effusion. Aortic atherosclerosis. Calcification in the LAD coronary artery noted. Mediastinum/Nodes: Previous thyroidectomy. The trachea appears patent and is midline. Normal appearance of the esophagus. Bilateral axillary adenopathy is identified. This was discussed on breast MRI from 09/15/2018. Bilateral enlarged lymph nodes have been biopsied and are considered benign. Stable by MRI from 09/15/2018. Prominent mediastinal and hilar lymph nodes are identified: -Subcarinal lymph node measures 1.5 cm, image 57/2. -There is a right hilar node which measures 1.4 cm, image 58/2. Lungs/Pleura: Mild changes of emphysema with diffuse bronchial wall thickening. Scattered scar like densities are identified within the right upper lobe, right middle lobe, superior segment of right lower lobe. Within the left upper lobe there is a sub solid nodule which measures 1.5 cm, image 49/5. Thin walled cystic nodule within the posterior right upper lobe measures 0.8 cm, image 53/5. Upper Abdomen: No acute abnormality noted within the upper abdomen. Musculoskeletal:  Post treatment changes with skin thickening and subcutaneous fat stranding are noted involving the right breast. No acute or significant osseous findings. IMPRESSION: 1. Borderline subcarinal and right hilar adenopathy is identified. Differential considerations include reactive adenopathy, lymphoproliferative disorder, granulomatous inflammation/infection, or metastatic adenopathy. Recommend repeat imaging in 3 months to assess for interval change. 2. There are 2 sub solid nodules identified within the upper lobes. These are also nonspecific. Non-contrast chest CT at 3-6 months is recommended. If nodules persist, subsequent management will be based upon the most suspicious nodule(s). This recommendation follows the consensus statement: Guidelines for Management of Incidental Pulmonary Nodules Detected on CT Images: From the Fleischner Society 2017; Radiology 2017; 284:228-243. 3. Bilateral axillary adenopathy. Previously biopsied and considered benign. These are stable by MRI from 09/15/2018. 4. Aortic Atherosclerosis (ICD10-I70.0) and Emphysema (ICD10-J43.9). Electronically Signed   By: TKerby MoorsM.D.   On: 09/27/2018 14:27   Mr Breast Right W Wo Contrast Inc Cad  Result Date: 09/27/2018 CLINICAL DATA:  Patient presented for MRI guided biopsy of a 4 mm enhancing lesion in the right breast, which was noted medial to the patient's known breast carcinoma. Patient is undergoing neoadjuvant chemotherapy with a significant  positive response. Pre treatment MRI showed a small enhancing area lateral to the right breast carcinoma. Follow-up MRI showed marked reduction in the right breast carcinoma. The small enhancing areas seen lateral to this was also smaller, measured at 4 mm. MRI biopsy was recommended if this would alter therapy. LABS:  No labs drawn at time of imaging. EXAM: MR OF THE RIGHT BREAST WITH AND WITHOUT CONTRAST TECHNIQUE: Multiplanar, multisequence MR images of the right breast were obtained prior  to and following the intravenous administration of 10 ml of Gadavist Three-dimensional MR images were rendered by post-processing of the original MR data on an independent workstation. The three-dimensional MR images were interpreted, and findings are reported in the following complete MRI report for this study. Three dimensional images were evaluated at the independent DynaCad workstation COMPARISON:  Previous exam(s). FINDINGS: Breast composition: a. Almost entirely fat. Background parenchymal enhancement: Mild Right breast: Subtle residual enhancement is noted adjacent to the biopsy clip in the medial right breast, unchanged from the exam performed on 09/15/2018. The 4 mm enhancing lesion previously seen lateral to this, in the central breast, is no longer identified. There is no abnormal enhancement outside of the known breast carcinoma residual enhancement to suggest a secondary focus of carcinoma. Lymph nodes: Enlarged lymph nodes again evident, biopsy proven reactive, likely secondary to the patient's hidradenitis. Ancillary findings:  None. IMPRESSION: 1. The small enhancing lesion previously seen lateral to the index right breast carcinoma is no longer visualized. Biopsy was therefore canceled. RECOMMENDATION: Treatment as planned for the known right breast carcinoma. BI-RADS CATEGORY  6: Known biopsy-proven malignancy. Electronically Signed   By: Lajean Manes M.D.   On: 09/27/2018 10:09   Nm Sentinel Node Inj-no Rpt (breast)  Result Date: 10/03/2018 Sulfur colloid was injected by the nuclear medicine technologist for melanoma sentinel node.   Mm Breast Surgical Specimen  Result Date: 10/03/2018 CLINICAL DATA:  Status post bracketed lumpectomy RIGHT breast. EXAM: SPECIMEN RADIOGRAPH OF THE RIGHT BREAST COMPARISON:  Previous exam(s). FINDINGS: Status post excision of the right breast. The ribbon shaped biopsy marker clip is present and marked for pathology. The seed marking the inferior aspect of  calcifications is also present and marked for pathology. The seed associated with a ribbon shaped clip has been removed separately and is imaged within the specimen cup. IMPRESSION: Specimen radiograph of the RIGHT breast. Electronically Signed   By: Nolon Nations M.D.   On: 10/03/2018 12:00   Mm Rt Radioactive Seed Loc Mammo Guide  Result Date: 10/01/2018 CLINICAL DATA:  Patient presents for seed localization prior to lumpectomy for RIGHT breast cancer. EXAM: MAMMOGRAPHIC GUIDED RADIOACTIVE SEED LOCALIZATION OF THE RIGHT BREAST COMPARISON:  Previous exam(s). FINDINGS: Patient presents for radioactive seed localization prior to lumpectomy. I met with the patient and we discussed the procedure of seed localization including benefits and alternatives. We discussed the high likelihood of a successful procedure. We discussed the risks of the procedure including infection, bleeding, tissue injury and further surgery. We discussed the low dose of radioactivity involved in the procedure. Informed, written consent was given. The usual time-out protocol was performed immediately prior to the procedure. Seed 1: Marking the ribbon shaped clip placed in the mass: Using mammographic guidance, sterile technique, 1% lidocaine and an I-125 radioactive seed, the ribbon shaped clip was localized using a MEDIAL to LATERAL approach. The follow-up mammogram images confirm the seed in the expected location and were marked for Dr. Brantley Stage. Follow-up survey of the patient confirms presence of  the radioactive seed. Order number of I-125 seed:  [id number]. Total activity:  7.654 millicuries reference Date: 09/17/2018 Seed 2: Marking the additional calcifications inferior to the mass: Using mammographic guidance, sterile technique, 1% lidocaine and an I-125 radioactive seed, the group of calcifications inferior to the ribbon shaped clip was localized using a MEDIAL to LATERAL approach. The follow-up mammogram images confirm the seed in  the expected location and were marked for Dr. Brantley Stage. Follow-up survey of the patient confirms presence of the radioactive seed. Order number of I-125 seed:  [id number]. Total activity:  8.127 millicurie reference Date: 09/17/2018 The patient tolerated the procedure well and was released from the [facility]. She was given instructions regarding seed removal. IMPRESSION: Bracketed seed localization of the RIGHT breast. No apparent complications. Electronically Signed   By: Nolon Nations M.D.   On: 10/01/2018 16:41   Mm Rt Radio Seed Ea Add Lesion Loc Mammo  Result Date: 10/01/2018 CLINICAL DATA:  Patient presents for seed localization prior to lumpectomy for RIGHT breast cancer. EXAM: MAMMOGRAPHIC GUIDED RADIOACTIVE SEED LOCALIZATION OF THE RIGHT BREAST COMPARISON:  Previous exam(s). FINDINGS: Patient presents for radioactive seed localization prior to lumpectomy. I met with the patient and we discussed the procedure of seed localization including benefits and alternatives. We discussed the high likelihood of a successful procedure. We discussed the risks of the procedure including infection, bleeding, tissue injury and further surgery. We discussed the low dose of radioactivity involved in the procedure. Informed, written consent was given. The usual time-out protocol was performed immediately prior to the procedure. Seed 1: Marking the ribbon shaped clip placed in the mass: Using mammographic guidance, sterile technique, 1% lidocaine and an I-125 radioactive seed, the ribbon shaped clip was localized using a MEDIAL to LATERAL approach. The follow-up mammogram images confirm the seed in the expected location and were marked for Dr. Brantley Stage. Follow-up survey of the patient confirms presence of the radioactive seed. Order number of I-125 seed:  [id number]. Total activity:  4.496 millicuries reference Date: 09/17/2018 Seed 2: Marking the additional calcifications inferior to the mass: Using mammographic  guidance, sterile technique, 1% lidocaine and an I-125 radioactive seed, the group of calcifications inferior to the ribbon shaped clip was localized using a MEDIAL to LATERAL approach. The follow-up mammogram images confirm the seed in the expected location and were marked for Dr. Brantley Stage. Follow-up survey of the patient confirms presence of the radioactive seed. Order number of I-125 seed:  [id number]. Total activity:  6.599 millicurie reference Date: 09/17/2018 The patient tolerated the procedure well and was released from the [facility]. She was given instructions regarding seed removal. IMPRESSION: Bracketed seed localization of the RIGHT breast. No apparent complications. Electronically Signed   By: Nolon Nations M.D.   On: 10/01/2018 16:41       IMPRESSION/PLAN: 1. 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 Dr. Lisbeth Renshaw reviewed her course. She is healing well and ready to proceed with adjuvant external radiotherapy to the breast followed by antiestrogen therapy. She will continue Herceptin as well. We discussed the risks, benefits, short, and long term effects of radiotherapy, and the patient is interested in proceeding. Dr. Lisbeth Renshaw discusses the delivery and logistics of radiotherapy and anticipates a course of 6 1/2 weeks of radiotherapy. Written consent is obtained and placed in the chart, a copy was provided to the patient. She will be contacted to simulate in another week or so to allow her healing to continue. 2.  Suppurative Hidradenitis. The patient has tried topical and systemic antibiotics without improvement. She cannot take oral contraceptives due to her breast cancer. We did discuss offlabel use of spironalactone to see if this would help her symptoms. I will give her the acne approved dose of 25 mg BID. She is on diuretics for her blood pressure already and supplements potassium regularly. She has blood work scheduled already with her  Hercpeptin and Perjeta. We discussed risks, benefits, and side effect profile of this drug and this was sent to her pharmacy.  In a visit lasting 25 minutes, greater than 50% of the time was spent face to face discussing her case, and coordinating the patient's care.  The above documentation reflects my direct findings during this shared patient visit. Please see the separate note by Dr. Lisbeth Renshaw on this date for the remainder of the patient's plan of care.    Carola Rhine, PAC

## 2018-11-02 ENCOUNTER — Other Ambulatory Visit: Payer: Self-pay

## 2018-11-02 ENCOUNTER — Encounter: Payer: Self-pay | Admitting: Gastroenterology

## 2018-11-02 ENCOUNTER — Ambulatory Visit (AMBULATORY_SURGERY_CENTER): Payer: 59

## 2018-11-02 VITALS — Ht 65.0 in | Wt 228.4 lb

## 2018-11-02 DIAGNOSIS — Z1211 Encounter for screening for malignant neoplasm of colon: Secondary | ICD-10-CM

## 2018-11-02 MED ORDER — NA SULFATE-K SULFATE-MG SULF 17.5-3.13-1.6 GM/177ML PO SOLN: 1.0000 | Freq: Once | ORAL | 0 refills | Status: AC

## 2018-11-02 NOTE — Progress Notes (Signed)
No egg or soy allergy known to patient  No issues with past sedation with any surgeries  or procedures, no intubation problems  No diet pills per patient No home 02 use per patient  No blood thinners per patient  Pt denies issues with constipation  No A fib or A flutter  EMMI video sent to pt's e mail  

## 2018-11-07 ENCOUNTER — Telehealth: Payer: Self-pay | Admitting: Gastroenterology

## 2018-11-07 ENCOUNTER — Telehealth: Payer: Self-pay

## 2018-11-07 NOTE — Telephone Encounter (Signed)
Patient called today after she was told by pharmacist that her medication would cost 1200-1800. Informed patient  most likely she needs a new copay card to cover what insurance doesn't. Ronney Asters, Pharmacy tech was able to take patient's call, and help set up new copay card online for patient.  [place]

## 2018-11-07 NOTE — Telephone Encounter (Signed)
Pt states prep is 95$- pt has [employer]-  States even the $15 coupon doesn't help the price for her-  Will give sample  Lot [id number]  Exp 11/21 as directed.  Pt aware she needs to pick up sample 3rd floor, front desk.Lelan Pons PV

## 2018-11-09 ENCOUNTER — Ambulatory Visit: Admission: RE | Admit: 2018-11-09 | Payer: 59 | Source: Ambulatory Visit | Admitting: Radiation Oncology

## 2018-11-09 ENCOUNTER — Other Ambulatory Visit: Payer: Self-pay | Admitting: Oncology

## 2018-11-12 ENCOUNTER — Telehealth: Payer: Self-pay | Admitting: Behavioral Health

## 2018-11-12 NOTE — Telephone Encounter (Signed)
Initiated PA request through cover my meds for Descovy for patient. PA Case ID: [id number] - Rx #: [id number]  Waiting for approval or denial.  Should have an answer in the next 24-73 hours. Pricilla Riffle RN

## 2018-11-13 ENCOUNTER — Inpatient Hospital Stay: Payer: 59

## 2018-11-13 VITALS — BP 140/76 | HR 69 | Temp 98.1°F | Resp 18 | Wt 228.0 lb

## 2018-11-13 DIAGNOSIS — C50211 Malignant neoplasm of upper-inner quadrant of right female breast: Secondary | ICD-10-CM

## 2018-11-13 DIAGNOSIS — C50311 Malignant neoplasm of lower-inner quadrant of right female breast: Secondary | ICD-10-CM

## 2018-11-13 DIAGNOSIS — Z170 Estrogen receptor positive status [ER+]: Principal | ICD-10-CM

## 2018-11-13 DIAGNOSIS — Z95828 Presence of other vascular implants and grafts: Secondary | ICD-10-CM

## 2018-11-13 DIAGNOSIS — C73 Malignant neoplasm of thyroid gland: Secondary | ICD-10-CM

## 2018-11-13 DIAGNOSIS — Z5112 Encounter for antineoplastic immunotherapy: Secondary | ICD-10-CM | POA: Diagnosis not present

## 2018-11-13 LAB — CBC WITH DIFFERENTIAL/PLATELET
Abs Immature Granulocytes: 0.02 10*3/uL (ref 0.00–0.07)
Basophils Absolute: 0 10*3/uL (ref 0.0–0.1)
Basophils Relative: 1 %
Eosinophils Absolute: 0.2 10*3/uL (ref 0.0–0.5)
Eosinophils Relative: 3 %
HCT: 32.3 % — ABNORMAL LOW (ref 36.0–46.0)
Hemoglobin: 10.4 g/dL — ABNORMAL LOW (ref 12.0–15.0)
Immature Granulocytes: 0 %
Lymphocytes Relative: 41 %
Lymphs Abs: 2.7 10*3/uL (ref 0.7–4.0)
MCH: 30.6 pg (ref 26.0–34.0)
MCHC: 32.2 g/dL (ref 30.0–36.0)
MCV: 95 fL (ref 80.0–100.0)
MONO ABS: 0.5 10*3/uL (ref 0.1–1.0)
Monocytes Relative: 8 %
Neutro Abs: 3.1 10*3/uL (ref 1.7–7.7)
Neutrophils Relative %: 47 %
Platelets: 251 10*3/uL (ref 150–400)
RBC: 3.4 MIL/uL — ABNORMAL LOW (ref 3.87–5.11)
RDW: 17.2 % — AB (ref 11.5–15.5)
WBC: 6.5 10*3/uL (ref 4.0–10.5)
nRBC: 0 % (ref 0.0–0.2)

## 2018-11-13 LAB — COMPREHENSIVE METABOLIC PANEL
ALT: 10 U/L (ref 0–44)
AST: 12 U/L — ABNORMAL LOW (ref 15–41)
Albumin: 3.5 g/dL (ref 3.5–5.0)
Alkaline Phosphatase: 98 U/L (ref 38–126)
Anion gap: 10 (ref 5–15)
BUN: 18 mg/dL (ref 6–20)
CALCIUM: 9.3 mg/dL (ref 8.9–10.3)
CO2: 28 mmol/L (ref 22–32)
Chloride: 103 mmol/L (ref 98–111)
Creatinine, Ser: 1.35 mg/dL — ABNORMAL HIGH (ref 0.44–1.00)
GFR calc Af Amer: 53 mL/min — ABNORMAL LOW (ref >60)
GFR calc non Af Amer: 46 mL/min — ABNORMAL LOW (ref >60)
Glucose, Bld: 136 mg/dL — ABNORMAL HIGH (ref 70–99)
Potassium: 3.1 mmol/L — ABNORMAL LOW (ref 3.5–5.1)
Sodium: 141 mmol/L (ref 135–145)
Total Bilirubin: 0.3 mg/dL (ref 0.3–1.2)
Total Protein: 8 g/dL (ref 6.5–8.1)

## 2018-11-13 MED ORDER — DIPHENHYDRAMINE HCL 25 MG PO CAPS
50.0000 mg | ORAL_CAPSULE | Freq: Once | ORAL | Status: AC
  Administered 2018-11-13: 50 mg via ORAL

## 2018-11-13 MED ORDER — ACETAMINOPHEN 325 MG PO TABS
ORAL_TABLET | ORAL | Status: AC
  Filled 2018-11-13: qty 2

## 2018-11-13 MED ORDER — SODIUM CHLORIDE 0.9% FLUSH
10.0000 mL | INTRAVENOUS | Status: DC | PRN
  Administered 2018-11-13: 10 mL
  Filled 2018-11-13: qty 10

## 2018-11-13 MED ORDER — HEPARIN SOD (PORK) LOCK FLUSH 100 UNIT/ML IV SOLN
500.0000 [IU] | Freq: Once | INTRAVENOUS | Status: AC | PRN
  Administered 2018-11-13: 500 [IU]
  Filled 2018-11-13: qty 5

## 2018-11-13 MED ORDER — ACETAMINOPHEN 325 MG PO TABS
650.0000 mg | ORAL_TABLET | Freq: Once | ORAL | Status: AC
  Administered 2018-11-13: 650 mg via ORAL

## 2018-11-13 MED ORDER — DIPHENHYDRAMINE HCL 25 MG PO CAPS
ORAL_CAPSULE | ORAL | Status: AC
  Filled 2018-11-13: qty 2

## 2018-11-13 MED ORDER — SODIUM CHLORIDE 0.9 % IV SOLN
Freq: Once | INTRAVENOUS | Status: AC
  Administered 2018-11-13: 09:00:00 via INTRAVENOUS
  Filled 2018-11-13: qty 250

## 2018-11-13 MED ORDER — SODIUM CHLORIDE 0.9 % IV SOLN
360.0000 mg | Freq: Once | INTRAVENOUS | Status: AC
  Administered 2018-11-13: 360 mg via INTRAVENOUS
  Filled 2018-11-13: qty 8

## 2018-11-13 NOTE — Patient Instructions (Signed)
[facility] Discharge Instructions for Patients Receiving Chemotherapy  Today you received the following chemotherapy agents Kadcycla  To help prevent nausea and vomiting after your treatment, we encourage you to take your nausea medication as directed. If you develop nausea and vomiting that is not controlled by your nausea medication, call the clinic.   BELOW ARE SYMPTOMS THAT SHOULD BE REPORTED IMMEDIATELY:  *FEVER GREATER THAN 100.5 F  *CHILLS WITH OR WITHOUT FEVER  NAUSEA AND VOMITING THAT IS NOT CONTROLLED WITH YOUR NAUSEA MEDICATION  *UNUSUAL SHORTNESS OF BREATH  *UNUSUAL BRUISING OR BLEEDING  TENDERNESS IN MOUTH AND THROAT WITH OR WITHOUT PRESENCE OF ULCERS  *URINARY PROBLEMS  *BOWEL PROBLEMS  UNUSUAL RASH Items with * indicate a potential emergency and should be followed up as soon as possible.  Feel free to call the clinic should you have any questions or concerns. The clinic phone number is [phone].  Please show the [place] at check-in to the Emergency Department and triage nurse.  Ado-Trastuzumab Emtansine for injection What is this medicine? ADO-TRASTUZUMAB EMTANSINE (ADD oh traz TOO zuh mab em TAN zine) is a monoclonal antibody combined with chemotherapy. It is used to treat breast cancer. This medicine may be used for other purposes; ask your health care provider or pharmacist if you have questions. COMMON BRAND NAME(S): Kadcyla What should I tell my health care provider before I take this medicine? They need to know if you have any of these conditions: -heart disease -heart failure -infection (especially a virus infection such as chickenpox, cold sores, or herpes) -liver disease -lung or breathing disease, like asthma -an unusual or allergic reaction to ado-trastuzumab emtansine, other medications, foods, dyes, or preservatives -pregnant or trying to get pregnant -breast-feeding How should I use this medicine? This medicine  is for infusion into a vein. It is given by a health care professional in a hospital or clinic setting. Talk to your pediatrician regarding the use of this medicine in children. Special care may be needed. Overdosage: If you think you have taken too much of this medicine contact a poison control center or emergency room at once. NOTE: This medicine is only for you. Do not share this medicine with others. What if I miss a dose? It is important not to miss your dose. Call your doctor or health care professional if you are unable to keep an appointment. What may interact with this medicine? This medicine may also interact with the following medications: -atazanavir -boceprevir -clarithromycin -delavirdine -indinavir -dalfopristin; quinupristin -isoniazid, INH -itraconazole -ketoconazole -nefazodone -nelfinavir -ritonavir -telaprevir -telithromycin -tipranavir -voriconazole This list may not describe all possible interactions. Give your health care provider a list of all the medicines, herbs, non-prescription drugs, or dietary supplements you use. Also tell them if you smoke, drink alcohol, or use illegal drugs. Some items may interact with your medicine. What should I watch for while using this medicine? Visit your doctor for checks on your progress. This drug may make you feel generally unwell. This is not uncommon, as chemotherapy can affect healthy cells as well as cancer cells. Report any side effects. Continue your course of treatment even though you feel ill unless your doctor tells you to stop. You may need blood work done while you are taking this medicine. Call your doctor or health care professional for advice if you get a fever, chills or sore throat, or other symptoms of a cold or flu. Do not treat yourself. This drug decreases your body's ability to  fight infections. Try to avoid being around people who are sick. Be careful brushing and flossing your teeth or using a toothpick  because you may get an infection or bleed more easily. If you have any dental work done, tell your dentist you are receiving this medicine. Avoid taking products that contain aspirin, acetaminophen, ibuprofen, naproxen, or ketoprofen unless instructed by your doctor. These medicines may hide a fever. Do not become pregnant while taking this medicine or for 7 months after stopping it, men with female partners should use contraception during treatment and for 4 months after the last dose. Women should inform their doctor if they wish to become pregnant or think they might be pregnant. There is a potential for serious side effects to an unborn child. Do not breast-feed an infant while taking this medicine or for 7 months after the last dose. Men who have a partner who is pregnant or who is capable of becoming pregnant should use a condom during sexual activity while taking this medicine and for 4 months after stopping it. Men should inform their doctors if they wish to father a child. This medicine may lower sperm counts. Talk to your health care professional or pharmacist for more information. What side effects may I notice from receiving this medicine? Side effects that you should report to your doctor or health care professional as soon as possible: -allergic reactions like skin rash, itching or hives, swelling of the face, lips, or tongue -breathing problems -chest pain or palpitations -fever or chills, sore throat -general ill feeling or flu-like symptoms -light-colored stools -nausea, vomiting -pain, tingling, numbness in the hands or feet -signs and symptoms of bleeding such as bloody or black, tarry stools; red or dark-brown urine; spitting up blood or brown material that looks like coffee grounds; red spots on the skin; unusual bruising or bleeding from the eye, gums, or nose -swelling of the legs or ankles -yellowing of the eyes or skin Side effects that usually do not require medical  attention (report to your doctor or health care professional if they continue or are bothersome): -changes in taste -constipation -dizziness -headache -joint pain -muscle pain -trouble sleeping -unusually weak or tired This list may not describe all possible side effects. Call your doctor for medical advice about side effects. You may report side effects to FDA at 1-800-FDA-1088. Where should I keep my medicine? This drug is given in a hospital or clinic and will not be stored at home. NOTE: This sheet is a summary. It may not cover all possible information. If you have questions about this medicine, talk to your doctor, pharmacist, or health care provider.  2019 Elsevier/Gold Standard (2015-11-23 12:11:06)

## 2018-11-13 NOTE — Telephone Encounter (Signed)
PA approved through 11/12/20. Called pharmacy who was able to confirm PA approval. Pharmacist states patient will have a $0 copay. [place]

## 2018-11-13 NOTE — Telephone Encounter (Signed)
PA approved for Descovy for 24 months until 11/12/2020 [facility] RN

## 2018-11-14 ENCOUNTER — Telehealth: Payer: Self-pay | Admitting: *Deleted

## 2018-11-14 NOTE — Telephone Encounter (Signed)
TCT patient to follow up with her after her 1st treatment with Kadcyla.  Spoke with patient. She states she is feeling fine today. Reports no ill affects from her treatment yesterday.  She is aware of her upcoming appts and voices understanding to call here with any questions or concerns.  She was appreciative of the call.

## 2018-11-14 NOTE — Telephone Encounter (Signed)
-----   Message from Thomasene Lot, RN sent at 11/13/2018  1:57 PM EST ----- Regarding: Dr [name] 1st tx f/u call 1st tx f/u call

## 2018-11-15 ENCOUNTER — Other Ambulatory Visit: Payer: Self-pay | Admitting: Adult Health

## 2018-11-15 DIAGNOSIS — E876 Hypokalemia: Secondary | ICD-10-CM

## 2018-11-16 ENCOUNTER — Ambulatory Visit (AMBULATORY_SURGERY_CENTER): Payer: 59 | Admitting: Gastroenterology

## 2018-11-16 ENCOUNTER — Encounter: Payer: Self-pay | Admitting: Gastroenterology

## 2018-11-16 VITALS — BP 178/89 | HR 76 | Temp 99.6°F | Resp 20 | Ht 65.0 in | Wt 231.0 lb

## 2018-11-16 DIAGNOSIS — Z1211 Encounter for screening for malignant neoplasm of colon: Secondary | ICD-10-CM | POA: Diagnosis not present

## 2018-11-16 MED ORDER — SODIUM CHLORIDE 0.9 % IV SOLN: 500.0000 mL | INTRAVENOUS | Status: DC

## 2018-11-16 NOTE — Patient Instructions (Signed)
Impression/Recommendations:  Diverticulosis handout given to patient.  Resume previous diet. Continue present medications.  Repeat colonoscopy in 10 years for screening purposes.  YOU HAD AN ENDOSCOPIC PROCEDURE TODAY AT [facility]:   Refer to the procedure report that was given to you for any specific questions about what was found during the examination.  If the procedure report does not answer your questions, please call your gastroenterologist to clarify.  If you requested that your care partner not be given the details of your procedure findings, then the procedure report has been included in a sealed envelope for you to review at your convenience later.  YOU SHOULD EXPECT: Some feelings of bloating in the abdomen. Passage of more gas than usual.  Walking can help get rid of the air that was put into your GI tract during the procedure and reduce the bloating. If you had a lower endoscopy (such as a colonoscopy or flexible sigmoidoscopy) you may notice spotting of blood in your stool or on the toilet paper. If you underwent a bowel prep for your procedure, you may not have a normal bowel movement for a few days.  Please Note:  You might notice some irritation and congestion in your nose or some drainage.  This is from the oxygen used during your procedure.  There is no need for concern and it should clear up in a day or so.  SYMPTOMS TO REPORT IMMEDIATELY:   Following lower endoscopy (colonoscopy or flexible sigmoidoscopy):  Excessive amounts of blood in the stool  Significant tenderness or worsening of abdominal pains  Swelling of the abdomen that is new, acute  Fever of 100F or higher For urgent or emergent issues, a gastroenterologist can be reached at any hour by calling [phone].   DIET:  We do recommend a small meal at first, but then you may proceed to your regular diet.  Drink plenty of fluids but you should avoid alcoholic beverages for 24  hours.  ACTIVITY:  You should plan to take it easy for the rest of today and you should NOT DRIVE or use heavy machinery until tomorrow (because of the sedation medicines used during the test).    FOLLOW UP: Our staff will call the number listed on your records the next business day following your procedure to check on you and address any questions or concerns that you may have regarding the information given to you following your procedure. If we do not reach you, we will leave a message.  However, if you are feeling well and you are not experiencing any problems, there is no need to return our call.  We will assume that you have returned to your regular daily activities without incident.  If any biopsies were taken you will be contacted by phone or by letter within the next 1-3 weeks.  Please call us at [phone] if you have not heard about the biopsies in 3 weeks.    SIGNATURES/CONFIDENTIALITY: You and/or your care partner have signed paperwork which will be entered into your electronic medical record.  These signatures attest to the fact that that the information above on your After Visit Summary has been reviewed and is understood.  Full responsibility of the confidentiality of this discharge information lies with you and/or your care-partner.

## 2018-11-16 NOTE — Progress Notes (Signed)
Report to PACU, RN, vss, BBS= Clear.  

## 2018-11-16 NOTE — Progress Notes (Signed)
Pt's states no medical or surgical changes since previsit or office visit. 

## 2018-11-16 NOTE — Op Note (Addendum)
[facility] Patient Name: [name] Procedure Date: 11/16/2018 9:59 AM MRN: [id number] Endoscopist: Remo Lipps P. Havery Moros , MD Age: 53 Referring MD:  Date of Birth: 1966/03/21 Gender: Female Account #: [id number] Procedure:                Colonoscopy Indications:              Screening for colorectal malignant neoplasm, This                            is the patient's first colonoscopy Medicines:                Monitored Anesthesia Care Procedure:                Pre-Anesthesia Assessment:                           - Prior to the procedure, a History and Physical                            was performed, and patient medications and                            allergies were reviewed. The patient's tolerance of                            previous anesthesia was also reviewed. The risks                            and benefits of the procedure and the sedation                            options and risks were discussed with the patient.                            All questions were answered, and informed consent                            was obtained. Prior Anticoagulants: The patient has                            taken no previous anticoagulant or antiplatelet                            agents. ASA Grade Assessment: III - A patient with                            severe systemic disease. After reviewing the risks                            and benefits, the patient was deemed in                            satisfactory condition to undergo the procedure.  After obtaining informed consent, the colonoscope                            was passed under direct vision. Throughout the                            procedure, the patient's blood pressure, pulse, and                            oxygen saturations were monitored continuously. The                            Colonoscope was introduced through the anus and                            advanced to the the  cecum, identified by                            appendiceal orifice and ileocecal valve. The                            colonoscopy was performed without difficulty. The                            patient tolerated the procedure well. The quality                            of the bowel preparation was good. The ileocecal                            valve, appendiceal orifice, and rectum were                            photographed. Scope In: 10:08:23 AM Scope Out: 10:21:25 AM Scope Withdrawal Time: 0 hours 9 minutes 46 seconds  Total Procedure Duration: 0 hours 13 minutes 2 seconds  Findings:                 The perianal and digital rectal examinations were                            normal.                           Multiple small-mouthed diverticula were found in                            the sigmoid colon.                           The exam was otherwise without abnormality. No                            polyps Complications:            No immediate complications. Estimated blood loss:  None. Estimated Blood Loss:     Estimated blood loss: none. Impression:               - Diverticulosis in the sigmoid colon.                           - The examination was otherwise normal.                           - No polyps Recommendation:           - Patient has a contact number available for                            emergencies. The signs and symptoms of potential                            delayed complications were discussed with the                            patient. Return to normal activities tomorrow.                            Written discharge instructions were provided to the                            patient.                           - Resume previous diet.                           - Continue present medications.                           - Repeat colonoscopy in 10 years for screening                            purposes. Remo Lipps P. Quinlin Conant,  MD 11/16/2018 10:23:33 AM This report has been signed electronically.

## 2018-11-19 ENCOUNTER — Telehealth: Payer: Self-pay | Admitting: *Deleted

## 2018-11-19 NOTE — Telephone Encounter (Signed)
  Follow up Call-  Call back number 11/16/2018  Post procedure Call Back phone  # [phone]  Permission to leave phone message Yes  Some recent data might be hidden     Patient questions:  Message left to call us if necessary.  Second call.

## 2018-11-19 NOTE — Telephone Encounter (Signed)
  Follow up Call-  Call back number 11/16/2018  Post procedure Call Back phone  # [phone]  Permission to leave phone message Yes  Some recent data might be hidden     Patient questions:  Message left to call us if necessary.

## 2018-11-27 ENCOUNTER — Other Ambulatory Visit: Payer: Self-pay | Admitting: *Deleted

## 2018-12-03 ENCOUNTER — Ambulatory Visit (HOSPITAL_BASED_OUTPATIENT_CLINIC_OR_DEPARTMENT_OTHER)
Admission: RE | Admit: 2018-12-03 | Discharge: 2018-12-03 | Disposition: A | Discharge status: Home / Self Care | Payer: 59 | Source: Ambulatory Visit | Attending: Internal Medicine | Admitting: Internal Medicine

## 2018-12-03 ENCOUNTER — Other Ambulatory Visit (HOSPITAL_COMMUNITY): Payer: Self-pay | Admitting: *Deleted

## 2018-12-03 ENCOUNTER — Other Ambulatory Visit (HOSPITAL_COMMUNITY): Payer: Self-pay

## 2018-12-03 ENCOUNTER — Ambulatory Visit (HOSPITAL_COMMUNITY)
Admission: RE | Admit: 2018-12-03 | Discharge: 2018-12-03 | Disposition: A | Discharge status: Home / Self Care | Payer: 59 | Source: Ambulatory Visit | Attending: Internal Medicine | Admitting: Internal Medicine

## 2018-12-03 VITALS — BP 150/86 | HR 77 | Wt 228.8 lb

## 2018-12-03 DIAGNOSIS — R5383 Other fatigue: Secondary | ICD-10-CM | POA: Diagnosis not present

## 2018-12-03 DIAGNOSIS — B20 Human immunodeficiency virus [HIV] disease: Secondary | ICD-10-CM | POA: Diagnosis not present

## 2018-12-03 DIAGNOSIS — Z79899 Other long term (current) drug therapy: Secondary | ICD-10-CM | POA: Diagnosis not present

## 2018-12-03 DIAGNOSIS — I10 Essential (primary) hypertension: Secondary | ICD-10-CM | POA: Diagnosis not present

## 2018-12-03 DIAGNOSIS — D649 Anemia, unspecified: Secondary | ICD-10-CM | POA: Insufficient documentation

## 2018-12-03 DIAGNOSIS — F17200 Nicotine dependence, unspecified, uncomplicated: Secondary | ICD-10-CM | POA: Diagnosis not present

## 2018-12-03 DIAGNOSIS — C50411 Malignant neoplasm of upper-outer quadrant of right female breast: Secondary | ICD-10-CM

## 2018-12-03 DIAGNOSIS — E0500 Thyrotoxicosis with diffuse goiter without thyrotoxic crisis or storm: Secondary | ICD-10-CM | POA: Diagnosis not present

## 2018-12-03 DIAGNOSIS — R0683 Snoring: Secondary | ICD-10-CM | POA: Diagnosis not present

## 2018-12-03 DIAGNOSIS — Z170 Estrogen receptor positive status [ER+]: Secondary | ICD-10-CM

## 2018-12-03 DIAGNOSIS — Z8249 Family history of ischemic heart disease and other diseases of the circulatory system: Secondary | ICD-10-CM | POA: Diagnosis not present

## 2018-12-03 DIAGNOSIS — R197 Diarrhea, unspecified: Secondary | ICD-10-CM | POA: Insufficient documentation

## 2018-12-03 DIAGNOSIS — C50311 Malignant neoplasm of lower-inner quadrant of right female breast: Secondary | ICD-10-CM | POA: Diagnosis not present

## 2018-12-03 DIAGNOSIS — J45909 Unspecified asthma, uncomplicated: Secondary | ICD-10-CM | POA: Insufficient documentation

## 2018-12-03 DIAGNOSIS — Z7984 Long term (current) use of oral hypoglycemic drugs: Secondary | ICD-10-CM | POA: Diagnosis not present

## 2018-12-03 DIAGNOSIS — E119 Type 2 diabetes mellitus without complications: Secondary | ICD-10-CM | POA: Insufficient documentation

## 2018-12-03 DIAGNOSIS — C50911 Malignant neoplasm of unspecified site of right female breast: Secondary | ICD-10-CM | POA: Insufficient documentation

## 2018-12-03 NOTE — Patient Instructions (Signed)
Your physician has requested that you have an echocardiogram. Echocardiography is a painless test that uses sound waves to create images of your heart. It provides your doctor with information about the size and shape of your heart and how well your heart's chambers and valves are working. This procedure takes approximately one hour. There are no restrictions for this procedure.  Your physician has recommended that you have a sleep study. This test records several body functions during sleep, including: brain activity, eye movement, oxygen and carbon dioxide blood levels, heart rate and rhythm, breathing rate and rhythm, the flow of air through your mouth and nose, snoring, body muscle movements, and chest and belly movement.  Follow up with Dr. Haroldine Laws in 3 months

## 2018-12-03 NOTE — Progress Notes (Signed)
  Echocardiogram 2D Echocardiogram has been performed.  [name] 12/03/2018, 10:52 AM

## 2018-12-03 NOTE — Progress Notes (Signed)
CARDIO-ONCOLOGY CLINIC CONSULT NOTE  Referring Physician: Magrinat Primary Cardiologist:  HPI:  Tanazia is a  53 y.o. female with h/o DM, Graves disease, thyroid CA, HIV, HTN and right breast cancer referred by Dr. Doris Cheadle for enrollment into the Cardio-Oncology program for EF monitoring during chemotherapy/    Right breast CA -  upper inner quadrant biopsy 04/26/2018 for a clinical T2 N0, stage Ib invasive ductal carcinoma, grade 2, with an MIB-1 of 70%.   (1) neoadjuvant chemotherapy with carboplatin, docetaxel, trastuzumab and Pertuzumab given every 21 days x 6, starting 05/31/2018             (a) Pertuzumab discontinued starting with cycle 2 because of diarrhea problems  (3) trastuzumab to continue to total 6 months             (a) echo on 05/16/2018 shows an EF of 60-65%.             (b) echo on 08/27/18 shows an EF 55-60%  (4) s/p lumpectomy 12/19  (5) adjuvant radiation to follow surgery  (6) antiestrogens to start at the completion of local treatment    Denies h/o known cardiac disease. Has chronic DOE and had echo and stress test last year which were negative. Remains SOB with exertion. No change. No CP, orthopnea, PND or edema. + fatigue and snoring  Review of Systems: [y] = yes, [ ]  = no   General: Weight gain [ ] ; Weight loss [ ] ; Anorexia [ ] ; Fatigue [ y]; Fever [ ] ; Chills [ ] ; Weakness [ ]   Cardiac: Chest pain/pressure [ ] ; Resting SOB [ ] ; Exertional SOB [ y]; Orthopnea [ ] ; Pedal Edema [ ] ; Palpitations [ ] ; Syncope [ ] ; Presyncope [ ] ; Paroxysmal nocturnal dyspnea[ ]   Pulmonary: Cough [ ] ; Wheezing[ ] ; Hemoptysis[ ] ; Sputum [ ] ; Snoring Blue.Reese ]  GI: Vomiting[ ] ; Dysphagia[ ] ; Melena[ ] ; Hematochezia [ ] ; Heartburn[ ] ; Abdominal pain [ ] ; Constipation [ ] ; Diarrhea [ ] ; BRBPR [ ]   GU: Hematuria[ ] ; Dysuria [ ] ; Nocturia[ ]   Vascular: Pain in legs with walking [ ] ; Pain in feet with lying flat [ ] ; Non-healing sores [ ] ; Stroke [ ] ; TIA [ ] ; Slurred speech [  ];  Neuro: Headaches[ ] ; Vertigo[ ] ; Seizures[ ] ; Paresthesias[ ] ;Blurred vision [ ] ; Diplopia [ ] ; Vision changes [ ]   Ortho/Skin: Arthritis [ y]; Joint pain [ y]; Muscle pain [ ] ; Joint swelling [ ] ; Back Pain [ ] ; Rash [ ]   Psych: Depression[ ] ; Anxiety[ ]   Heme: Bleeding problems [ ] ; Clotting disorders [ ] ; Anemia [ y]  Endocrine: Diabetes Blue.Reese ]; Thyroid dysfunction[y ]   Past Medical History:  Diagnosis Date  . Allergy   . Anemia    Normocytic  . Anxiety   . Asthma   . Blood dyscrasia   . Bronchitis 2005  . 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 Diabetes mellitus without complication ([place])   . Family history of breast cancer   . Family history of lung cancer   . Family history of prostate cancer   . Gastroenteritis 07/10/07  . GERD 07/24/2006  . GRAVE'S DISEASE 01/01/2008  . History of hidradenitis suppurativa   . History of kidney stones   . History of thrush   . HIV DISEASE 07/24/2006   dx March 05  . Hyperlipidemia   . HYPERTENSION 07/24/2006  . Hyperthyroidism 08/2006   Grave's Disease -diffuse radiotracer uptake 08/25/06 Thyroid scan-Cold nodule to R lower lobe of  thyrorid  . Menometrorrhagia    hx of  . Nephrolithiasis   . 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 Pneumonia 2005  . 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 Sarcoidosis 02/08/2007   dx as a teenager in [place] from abnl CXR. Completed 2 yrs Prednisone after lung bx confirmation. No symptoms since then.  . Suppurative hidradenitis   . Thyroid cancer ([place])   . THYROID NODULE, RIGHT 02/08/2007    Current Outpatient Medications  Medication Sig Dispense Refill  . amLODipine (NORVASC) 10 MG tablet Take 1 tablet (10 mg total) by mouth daily. For blood pressure. 90 tablet 3  . atorvastatin (LIPITOR) 20 MG tablet Take 1 tablet by mouth every evening for high cholesterol. NEED APPOINTMENT FOR ANY MORE REFILLS (Patient taking differently:  Take 20 mg by mouth every evening. ) 30 tablet 0  . dolutegravir ([name]) 50 MG tablet Take 1 tablet (50 mg total) by mouth daily. 30 tablet 11  . doxycycline (VIBRA-TABS) 100 MG tablet 100 mg daily.     Marland Kitchen emtricitabine-tenofovir AF (DESCOVY) 200-25 MG tablet Take 1 tablet by mouth daily. 30 tablet 11  . gabapentin (NEURONTIN) 300 MG capsule TAKE 1 CAPSULE BY MOUTH THREE TIMES A DAY AS NEEDED    . irbesartan-hydrochlorothiazide (AVALIDE) 150-12.5 MG tablet TAKE 1 TABLET BY MOUTH ONCE DAILY FOR BLOOD PRESSURE. (Patient taking differently: Take 1 tablet by mouth daily. ) 90 tablet 1  . levothyroxine (SYNTHROID, LEVOTHROID) 175 MCG tablet TAKE 1 TABLET BY MOUTH EVERY MORNING ON AN EMPTY STOMACH WITH A FULL GLASS OF WATER. (Patient taking differently: Take 175 mcg by mouth daily before breakfast. ) 30 tablet 5  . lidocaine-prilocaine (EMLA) cream Apply to affected area once 30 g 3  . LORazepam (ATIVAN) 0.5 MG tablet TAKE 1 TABLET (0.5 MG TOTAL) BY MOUTH AT BEDTIME AS NEEDED (NAUSEA OR VOMITING). 20 tablet 0  . metFORMIN (GLUCOPHAGE) 500 MG tablet Take 1 tablet (500 mg total) by mouth 2 (two) times daily with a meal. 180 tablet 3  . metoprolol succinate (TOPROL-XL) 50 MG 24 hr tablet TAKE 1 TABLET (50 MG TOTAL) BY MOUTH DAILY. TAKE WITH OR IMMEDIATELY FOLLOWING A MEAL. 90 tablet 1  . Multiple Vitamin (MULTIVITAMIN) tablet Take 1 tablet by mouth daily.    . ONE TOUCH LANCETS MISC Use as instructed to test blood sugar daily 200 each 5  . ONETOUCH VERIO test strip USE AS INSTRUCTED TO TEST BLOOD SUGAR DAILY NEED APPOINTMENT FOR ANY MORE REFILLS 50 each 0  . oxyCODONE (OXY IR/ROXICODONE) 5 MG immediate release tablet Take 1 tablet (5 mg total) by mouth every 6 (six) hours as needed for severe pain. 12 tablet 0  . oxyCODONE-acetaminophen (PERCOCET/ROXICET) 5-325 MG tablet TAKE 1 TABLET BY MOUTH 4 TIMES A DAY AS NEEDED    . PROAIR RESPICLICK 854 (90 Base) MCG/ACT AEPB INHALE 2 PUFFS INTO THE LUNGS EVERY 6  HOURS AS NEEDED FOR WHEEZING OR SHORTNESS OF BREATH. 1 each 2  . prochlorperazine (COMPAZINE) 10 MG tablet TAKE 1 TABLET BY MOUTH EVERY 6 HOURS AS NEEDED FOR NAUSEA OR VOMITING 30 tablet 2  . spironolactone (ALDACTONE) 25 MG tablet Take 1 tablet (25 mg total) by mouth 2 (two) times daily. 60 tablet 3  . dexamethasone (DECADRON) 4 MG tablet Take 2 tablets (8 mg total) by mouth 2 (two) times daily. Start the day before Taxotere. Take once the day after, then 2 times a day x 2d. (Patient  not taking: Reported on 12/03/2018) 30 tablet 1  . mometasone (NASONEX) 50 MCG/ACT nasal spray PLACE 2 SPRAYS INTO THE NOSE DAILY. (Patient not taking: Reported on 11/16/2018) 17 g 3  . potassium chloride (K-DUR) 10 MEQ tablet Take 2 tablets (20 mEq total) by mouth 2 (two) times daily. (Patient not taking: Reported on 12/03/2018) 20 tablet 0  . sulfamethoxazole-trimethoprim (BACTRIM DS,SEPTRA DS) 800-160 MG tablet Take 1 tablet by mouth 2 (two) times daily. (Patient not taking: Reported on 12/03/2018) 20 tablet 0   No current facility-administered medications for this encounter.    Facility-Administered Medications Ordered in Other Encounters  Medication Dose Route Frequency Provider Last Rate Last Dose  . sodium chloride flush (NS) 0.9 % injection 10 mL  10 mL Intracatheter PRN Magrinat, Virgie Dad, MD   10 mL at 09/19/18 1551    Allergies  Allergen Reactions  . Genvoya [Elviteg-Cobic-Emtricit-Tenofaf] Hives  . Lisinopril Cough  . Valsartan Other (See Comments) and Cough    Pt states she tolerates medicine now      Social History   Socioeconomic History  . Marital status: Single    Spouse name: Not on file  . Number of children: 1  . Years of education: Not on file  . Highest education level: Not on file  Occupational History  . Occupation: Secondary school teacher  Social Needs  . Financial resource strain: Not on file  . Food insecurity:    Worry: Not on file    Inability: Not on file  . Transportation  needs:    Medical: No    Non-medical: No  Tobacco Use  . Smoking status: Former Smoker    Packs/day: 0.50    Years: 15.00    Pack years: 7.50    Types: Cigarettes    Start date: 04/12/2017  . Smokeless tobacco: Never Used  . Tobacco comment: quit smoking  may 2018  Substance and Sexual Activity  . Alcohol use: Yes    Alcohol/week: 0.0 standard drinks    Comment: social  . Drug use: No  . Sexual activity: Not Currently    Birth control/protection: Post-menopausal    Comment: declined condoms  Lifestyle  . Physical activity:    Days per week: Not on file    Minutes per session: Not on file  . Stress: Not on file  Relationships  . Social connections:    Talks on phone: Not on file    Gets together: Not on file    Attends religious service: Not on file    Active member of club or organization: Not on file    Attends meetings of clubs or organizations: Not on file    Relationship status: Not on file  . Intimate partner violence:    Fear of current or ex partner: No    Emotionally abused: No    Physically abused: No    Forced sexual activity: No  Other Topics Concern  . Not on file  Social History Narrative  . Not on file      Family History  Problem Relation Age of Onset  . Hypertension Mother   . Cancer Mother        laryngeal  . Heart disease Mother        stent  . Hypertension Father   . Lung cancer Father 7       hx smoking  . Heart disease Other   . Hypertension Other   . Stroke Other  Grandparent  . Kidney disease Other        Grandparent  . Diabetes Other        FH of Diabetes  . Hypertension Sister   . Cancer Maternal Uncle        Lung CA  . Hypertension Brother   . Hypertension Sister   . Breast cancer Maternal Aunt 65  . Breast cancer Paternal Aunt 73  . Prostate cancer Paternal Uncle   . Breast cancer Maternal Aunt        dx 60+  . Breast cancer Paternal Aunt        dx 83's  . Breast cancer Paternal Aunt        dx 26's  .  Prostate cancer Paternal Uncle   . Lung cancer Paternal Uncle   . Breast cancer Cousin 6  . Breast cancer Cousin        dx <50  . Breast cancer Cousin        dx <50  . Breast cancer Cousin        dx <50  . Colon cancer Neg Hx   . Esophageal cancer Neg Hx   . Rectal cancer Neg Hx   . Stomach cancer Neg Hx     Vitals:   12/03/18 1053  BP: (!) 150/86  Pulse: 77  SpO2: 97%  Weight: 103.8 kg (228 lb 12.8 oz)    PHYSICAL EXAM: General:  Well appearing. No respiratory difficulty HEENT: norma Neck: supple. no JVD. Carotids 2+ bilat; no bruits. No lymphadenopathy or thryomegaly appreciated. + surgical scar Cor: PMI nondisplaced. Regular rate & rhythm. No rubs, gallops or murmurs. Lungs: clear Abdomen: soft, nontender, nondistended. No hepatosplenomegaly. No bruits or masses. Good bowel sounds. Extremities: no cyanosis, clubbing, rash, edema Neuro: alert & oriented x 3, cranial nerves grossly intact. moves all 4 extremities w/o difficulty. Affect pleasant.    ASSESSMENT & PLAN: 1. R Breast Cancer - Explained incidence of Herceptin cardiotoxicity and role of Cardio-oncology clinic at length. Echo images reviewed personally. All parameters stable. Reviewed signs and symptoms of HF to look for. Continue Herceptin. Follow-up with echo in 3 months.  2. Snoring/fatigue - Refer to Dr. Radford Pax for sleeps study  Glori Bickers, MD  11:45 AM

## 2018-12-06 ENCOUNTER — Inpatient Hospital Stay: Payer: 59

## 2018-12-06 ENCOUNTER — Inpatient Hospital Stay: Payer: 59 | Attending: Oncology

## 2018-12-06 ENCOUNTER — Encounter: Payer: Self-pay | Admitting: Adult Health

## 2018-12-06 ENCOUNTER — Telehealth: Payer: Self-pay | Admitting: Adult Health

## 2018-12-06 ENCOUNTER — Inpatient Hospital Stay (HOSPITAL_BASED_OUTPATIENT_CLINIC_OR_DEPARTMENT_OTHER): Payer: 59 | Admitting: Adult Health

## 2018-12-06 VITALS — BP 129/80 | HR 84 | Temp 98.3°F | Resp 18 | Ht 65.0 in | Wt 230.0 lb

## 2018-12-06 DIAGNOSIS — C50211 Malignant neoplasm of upper-inner quadrant of right female breast: Secondary | ICD-10-CM | POA: Insufficient documentation

## 2018-12-06 DIAGNOSIS — Z801 Family history of malignant neoplasm of trachea, bronchus and lung: Secondary | ICD-10-CM

## 2018-12-06 DIAGNOSIS — C50311 Malignant neoplasm of lower-inner quadrant of right female breast: Secondary | ICD-10-CM

## 2018-12-06 DIAGNOSIS — Z5112 Encounter for antineoplastic immunotherapy: Secondary | ICD-10-CM | POA: Insufficient documentation

## 2018-12-06 DIAGNOSIS — L732 Hidradenitis suppurativa: Secondary | ICD-10-CM

## 2018-12-06 DIAGNOSIS — C73 Malignant neoplasm of thyroid gland: Secondary | ICD-10-CM

## 2018-12-06 DIAGNOSIS — Z95828 Presence of other vascular implants and grafts: Secondary | ICD-10-CM

## 2018-12-06 DIAGNOSIS — Z803 Family history of malignant neoplasm of breast: Secondary | ICD-10-CM

## 2018-12-06 DIAGNOSIS — E119 Type 2 diabetes mellitus without complications: Secondary | ICD-10-CM

## 2018-12-06 DIAGNOSIS — Z808 Family history of malignant neoplasm of other organs or systems: Secondary | ICD-10-CM

## 2018-12-06 DIAGNOSIS — Z170 Estrogen receptor positive status [ER+]: Secondary | ICD-10-CM

## 2018-12-06 DIAGNOSIS — Z87891 Personal history of nicotine dependence: Secondary | ICD-10-CM

## 2018-12-06 DIAGNOSIS — I10 Essential (primary) hypertension: Secondary | ICD-10-CM

## 2018-12-06 LAB — CBC WITH DIFFERENTIAL/PLATELET
Abs Immature Granulocytes: 0.02 10*3/uL (ref 0.00–0.07)
Basophils Absolute: 0 10*3/uL (ref 0.0–0.1)
Basophils Relative: 0 %
EOS ABS: 0.2 10*3/uL (ref 0.0–0.5)
Eosinophils Relative: 2 %
HCT: 34.4 % — ABNORMAL LOW (ref 36.0–46.0)
Hemoglobin: 11 g/dL — ABNORMAL LOW (ref 12.0–15.0)
IMMATURE GRANULOCYTES: 0 %
LYMPHS ABS: 3 10*3/uL (ref 0.7–4.0)
Lymphocytes Relative: 41 %
MCH: 29.8 pg (ref 26.0–34.0)
MCHC: 32 g/dL (ref 30.0–36.0)
MCV: 93.2 fL (ref 80.0–100.0)
Monocytes Absolute: 0.5 10*3/uL (ref 0.1–1.0)
Monocytes Relative: 7 %
Neutro Abs: 3.6 10*3/uL (ref 1.7–7.7)
Neutrophils Relative %: 50 %
Platelets: 330 10*3/uL (ref 150–400)
RBC: 3.69 MIL/uL — ABNORMAL LOW (ref 3.87–5.11)
RDW: 16.7 % — AB (ref 11.5–15.5)
WBC: 7.3 10*3/uL (ref 4.0–10.5)
nRBC: 0 % (ref 0.0–0.2)

## 2018-12-06 LAB — COMPREHENSIVE METABOLIC PANEL
ALT: 14 U/L (ref 0–44)
AST: 21 U/L (ref 15–41)
Albumin: 3.2 g/dL — ABNORMAL LOW (ref 3.5–5.0)
Alkaline Phosphatase: 98 U/L (ref 38–126)
Anion gap: 11 (ref 5–15)
BUN: 17 mg/dL (ref 6–20)
CO2: 30 mmol/L (ref 22–32)
Calcium: 9.4 mg/dL (ref 8.9–10.3)
Chloride: 100 mmol/L (ref 98–111)
Creatinine, Ser: 1.33 mg/dL — ABNORMAL HIGH (ref 0.44–1.00)
GFR calc Af Amer: 54 mL/min — ABNORMAL LOW (ref >60)
GFR calc non Af Amer: 46 mL/min — ABNORMAL LOW (ref >60)
Glucose, Bld: 196 mg/dL — ABNORMAL HIGH (ref 70–99)
Potassium: 3.5 mmol/L (ref 3.5–5.1)
SODIUM: 141 mmol/L (ref 135–145)
Total Bilirubin: 0.2 mg/dL — ABNORMAL LOW (ref 0.3–1.2)
Total Protein: 8 g/dL (ref 6.5–8.1)

## 2018-12-06 MED ORDER — SODIUM CHLORIDE 0.9% FLUSH
10.0000 mL | INTRAVENOUS | Status: DC | PRN
  Administered 2018-12-06: 10 mL
  Filled 2018-12-06: qty 10

## 2018-12-06 MED ORDER — ACETAMINOPHEN 325 MG PO TABS
650.0000 mg | ORAL_TABLET | Freq: Once | ORAL | Status: AC
  Administered 2018-12-06: 650 mg via ORAL

## 2018-12-06 MED ORDER — SODIUM CHLORIDE 0.9 % IV SOLN
3.4000 mg/kg | Freq: Once | INTRAVENOUS | Status: AC
  Administered 2018-12-06: 360 mg via INTRAVENOUS
  Filled 2018-12-06: qty 10

## 2018-12-06 MED ORDER — DIPHENHYDRAMINE HCL 25 MG PO CAPS
50.0000 mg | ORAL_CAPSULE | Freq: Once | ORAL | Status: AC
  Administered 2018-12-06: 50 mg via ORAL

## 2018-12-06 MED ORDER — SODIUM CHLORIDE 0.9 % IV SOLN
Freq: Once | INTRAVENOUS | Status: AC
  Administered 2018-12-06: 13:00:00 via INTRAVENOUS
  Filled 2018-12-06: qty 250

## 2018-12-06 MED ORDER — HEPARIN SOD (PORK) LOCK FLUSH 100 UNIT/ML IV SOLN
500.0000 [IU] | Freq: Once | INTRAVENOUS | Status: AC | PRN
  Administered 2018-12-06: 500 [IU]
  Filled 2018-12-06: qty 5

## 2018-12-06 MED ORDER — ACETAMINOPHEN 325 MG PO TABS
ORAL_TABLET | ORAL | Status: AC
  Filled 2018-12-06: qty 2

## 2018-12-06 MED ORDER — DIPHENHYDRAMINE HCL 25 MG PO CAPS
ORAL_CAPSULE | ORAL | Status: AC
  Filled 2018-12-06: qty 2

## 2018-12-06 NOTE — Progress Notes (Signed)
[facility]  [phone]) [phone] [phone]     ID: [name] DOB: [birth year]  MR#: [id number]  QDI#:[id number]  Patient Care Team: [name], NP as PCP - General (Internal Medicine) Michel Bickers, MD as PCP - Infectious Diseases (Infectious Diseases) Magrinat, Virgie Dad, MD as Consulting Physician (Oncology) Kyung Rudd, MD as Consulting Physician (Radiation Oncology) OTHER MD:   CHIEF COMPLAINT: Triple positive breast cancer  CURRENT TREATMENT: Adjuvant immunotherapy  INTERVAL HISTORY: Maraya returns today for follow up and treatment of her triple positive breast cancer.  She is here to receive adjuvant TDM1. She has not yet started adjuvant radiation due to delayed healing at her lumpectomy site.     Since her last visit she underwent an echocardiogram on 12/03/2018 that showed a well preserved ejection fraction of 60-65%.    REVIEW OF SYSTEMS: Jhordan is feeling well today.  She is tolerating treatment with TDM1 well.  She hasn't had any issues with decreased platelets or neuropathy.  She denies nausea or vomiting.  She is without fever chills, cough, shortness of breath or chest pain.  A detailed ROS was otherwise non contributory.    HISTORY OF CURRENT ILLNESS: From the original intake note:  Patience Cornelia Son (pronounced "Huff") palpated a mass in the right breast for about 2 weeks before bringing it to medical attention. She underwent bilateral diagnostic mammography with tomography and right breast ultrasonography at [facility] on 04/20/2018 showing: breast density category B. There was a highly suspicious mass located at 3 o'clock in the upper inner quadrant measuring 2.9 x 2.5 x 1.6 cm and 4 cm from the nipple. Ultrasound of the right axilla showed 5 lymph nodes with abnormal cortical thickening. The left axilla showed 3 lymph nodes with abnormal cortical thickening.  Accordingly on 04/26/2018 she proceeded to biopsy  of the right breast area in question. The pathology from this procedure showed ([id number]): Invasive ductal carcinoma, grade II with calcifications. One right axillary lymph node and one left axillary lymph node were negative for carcinoma. Prognostic indicators significant for: estrogen receptor, 100% positive and progesterone receptor, 80% positive, both with strong staining intensity. Proliferation marker Ki67 at 70%. HER2 amplified with ratios HER2/CEP17 signals 4.52 and average HER2 copies per cell 10.85  The patient's subsequent history is as detailed below.  PAST MEDICAL HISTORY: Past Medical History:  Diagnosis Date  . Allergy   . Anemia    Normocytic  . Anxiety   . Asthma   . Blood dyscrasia   . Bronchitis 2005  . 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 Diabetes mellitus without complication ([place])   . Family history of breast cancer   . Family history of lung cancer   . Family history of prostate cancer   . Gastroenteritis 07/10/07  . GERD 07/24/2006  . GRAVE'S DISEASE 01/01/2008  . History of hidradenitis suppurativa   . History of kidney stones   . History of thrush   . HIV DISEASE 07/24/2006   dx March 05  . Hyperlipidemia   . HYPERTENSION 07/24/2006  . Hyperthyroidism 08/2006   Grave's Disease -diffuse radiotracer uptake 08/25/06 Thyroid scan-Cold nodule to R lower lobe of thyrorid  . Menometrorrhagia    hx of  . Nephrolithiasis   . 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 Pneumonia 2005  . 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 Sarcoidosis 02/08/2007   dx as a teenager in [place] from abnl CXR. Completed 2 yrs  Prednisone after lung bx confirmation. No symptoms since then.  . Suppurative hidradenitis   . Thyroid cancer ([place])   . THYROID NODULE, RIGHT 02/08/2007  -2018 thyroid nodules were precancerous but pathology was negative for thyroid cancer.   PAST SURGICAL HISTORY: Past Surgical History:   Procedure Laterality Date  . BREAST LUMPECTOMY WITH RADIOACTIVE SEED AND SENTINEL LYMPH NODE BIOPSY Right 10/03/2018   Procedure: RIGHT BREAST LUMPECTOMY WITH RADIOACTIVE SEED AND SENTINEL LYMPH NODE MAPPING;  Surgeon: Erroll Luna, MD;  Location: [facility];  Service: General;  Laterality: Right;  . BREAST SURGERY  1997   Breast Reduction   . CYSTOSCOPY W/ URETERAL STENT REMOVAL  11/09/2012   Procedure: CYSTOSCOPY WITH STENT REMOVAL;  Surgeon: Alexis Frock, MD;  Location: [facility];  Service: Urology;  Laterality: Right;  . CYSTOSCOPY WITH RETROGRADE PYELOGRAM, URETEROSCOPY AND STENT PLACEMENT  11/09/2012   Procedure: CYSTOSCOPY WITH RETROGRADE PYELOGRAM, URETEROSCOPY AND STENT PLACEMENT;  Surgeon: Alexis Frock, MD;  Location[facility];  Service: Urology;  Laterality: Left;  LEFT URETEROSCOPY, STONE MANIPULATION, left STENT exchange   . CYSTOSCOPY WITH STENT PLACEMENT  10/02/2012   Procedure: CYSTOSCOPY WITH STENT PLACEMENT;  Surgeon: Alexis Frock, MD;  [facility];  Service: Urology;  Laterality: Left;  . DILATION AND CURETTAGE OF UTERUS  Feb 2004   s/p for 1st trimester nonviable pregnancy  . EYE SURGERY     sty under eyelid  . INCISE AND DRAIN ABCESS  Nov 03   s/p I &D for righ inframmary fold hidradenitis  . INCISION AND DRAINAGE PERITONSILLAR ABSCESS  Mar 03  . IR CV LINE INJECTION  06/07/2018  . IR IMAGING GUIDED PORT INSERTION  06/20/2018  . IR REMOVAL TUN ACCESS W/ PORT W/O FL MOD SED  06/20/2018  . IRRIGATION AND DEBRIDEMENT ABSCESS  01/31/2012   Procedure: IRRIGATION AND DEBRIDEMENT ABSCESS;  Surgeon: Shann Medal, MD;  [facility];  Service: General;  Laterality: Right;  right breast and axilla   . NEPHROLITHOTOMY  10/02/2012   Procedure: NEPHROLITHOTOMY PERCUTANEOUS;  Surgeon: Alexis Frock, MD;  [facility];  Service: Urology;  Laterality: Right;  First Stage Percutaneous Nephrolithotomy with Surgeon Access, Left Ureteral Stent    . NEPHROLITHOTOMY  10/04/2012    Procedure: NEPHROLITHOTOMY PERCUTANEOUS SECOND LOOK;  Surgeon: Alexis Frock, MD;  [facility];  Service: Urology;  Laterality: Right;     . NEPHROLITHOTOMY  10/08/2012   Procedure: NEPHROLITHOTOMY PERCUTANEOUS;  Surgeon: Alexis Frock, MD;  [facility];  Service: Urology;  Laterality: Right;  THIRD STAGE, nephrostomy tube exchange x 2  . NEPHROLITHOTOMY  10/11/2012   Procedure: NEPHROLITHOTOMY PERCUTANEOUS SECOND LOOK;  Surgeon: Alexis Frock, MD;  [facility];  Service: Urology;  Laterality: Right;  RIGHT 4 STAGE PERCUTANOUS NEPHROLITHOTOMY, right URETEROSCOPY WITH HOLMIUM LASER   . PORTACATH PLACEMENT Left 05/17/2018   Procedure: INSERTION PORT-A-CATH;  Surgeon: Coralie Keens, MD;  Location: [facility];  Service: General;  Laterality: Left;  . RADICAL NECK DISSECTION  04/12/2017   limited/notes 04/12/2017  . RADICAL NECK DISSECTION N/A 04/12/2017   Procedure: RADICAL NECK DISSECTION;  Surgeon: Melida Quitter, MD;  Location: [facility];  Service: ENT;  Laterality: N/A;  limited neck dissection 2 hours total  . REDUCTION MAMMAPLASTY Bilateral 1998  . [place]  1994  . THYROIDECTOMY  04/12/2017   completion/notes 04/12/2017  . THYROIDECTOMY N/A 04/12/2017   Procedure: THYROIDECTOMY;  Surgeon: Melida Quitter, MD;  Location: [facility];  Service: ENT;  Laterality: N/A;  Completion Thyroidectomy  . TOTAL THYROIDECTOMY  2010  Thyroid nodules removed- pathology at the ENT doctor 2018 were benign -Over active thyroid and thyroidectomy with benign pathology  FAMILY HISTORY Family History  Problem Relation Age of Onset  . Hypertension Mother   . Cancer Mother        laryngeal  . Heart disease Mother        stent  . Hypertension Father   . Lung cancer Father 4       hx smoking  . Heart disease Other   . Hypertension Other   . Stroke Other        Grandparent  . Kidney disease Other        Grandparent  . Diabetes Other        FH of Diabetes  . Hypertension Sister   .  Cancer Maternal Uncle        Lung CA  . Hypertension Brother   . Hypertension Sister   . Breast cancer Maternal Aunt 65  . Breast cancer Paternal Aunt 65  . Prostate cancer Paternal Uncle   . Breast cancer Maternal Aunt        dx 60+  . Breast cancer Paternal Aunt        dx 30's  . Breast cancer Paternal Aunt        dx 53's  . Prostate cancer Paternal Uncle   . Lung cancer Paternal Uncle   . Breast cancer Cousin 92  . Breast cancer Cousin        dx <50  . Breast cancer Cousin        dx <50  . Breast cancer Cousin        dx <50  . Colon cancer Neg Hx   . Esophageal cancer Neg Hx   . Rectal cancer Neg Hx   . Stomach cancer Neg Hx   The patient's mother is alive at age 76. The patient's father died at 42 from lung cancer (heavy smoker). She does not know much about her father. The patient has 1 brother and 2 sisters. The patient's mother had a history of laryngeal cancer. There was a maternal cousin with breast cancer diagnosed at age 89. There were 2 maternal aunts with breast cancer, the youngest being diagnosed in the 66's. There was a maternal great aunt with breast cancer. There were 2 paternal aunts with breast cancer.    GYNECOLOGIC HISTORY:  Patient's last menstrual period was 03/31/2014. Menarche: 53 years old Age at first live birth: 53 years old She is GX P1.  Her LMP was in 2017. She did not use HRT.    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 At home is the patient's daughter, Verdie Drown, age 60, who is a Engineer, technical sales education and working part-time at the [employer]. The patient attends [employer].     ADVANCED DIRECTIVES: Not in place; at the 05/10/2018 visit the patient was given the appropriate documents to complete and notarized at her discretion   HEALTH MAINTENANCE: Social History   Tobacco Use  . Smoking status: Former Smoker    Packs/day: 0.50    Years: 15.00    Pack years: 7.50    Types:  Cigarettes    Start date: 04/12/2017  . Smokeless tobacco: Never Used  . Tobacco comment: quit smoking  may 2018  Substance Use Topics  . Alcohol use: Yes    Alcohol/week: 0.0 standard drinks  Comment: social  . Drug use: No     Colonoscopy: no  PAP: [facility]. 2018  Bone density: remote   Allergies  Allergen Reactions  . Genvoya [Elviteg-Cobic-Emtricit-Tenofaf] Hives  . Lisinopril Cough  . Valsartan Other (See Comments) and Cough    Pt states she tolerates medicine now    Current Outpatient Medications  Medication Sig Dispense Refill  . amLODipine (NORVASC) 10 MG tablet Take 1 tablet (10 mg total) by mouth daily. For blood pressure. 90 tablet 3  . atorvastatin (LIPITOR) 20 MG tablet Take 1 tablet by mouth every evening for high cholesterol. NEED APPOINTMENT FOR ANY MORE REFILLS (Patient taking differently: Take 20 mg by mouth every evening. ) 30 tablet 0  . dolutegravir ([name]) 50 MG tablet Take 1 tablet (50 mg total) by mouth daily. 30 tablet 11  . doxycycline (VIBRA-TABS) 100 MG tablet 100 mg daily.     Marland Kitchen emtricitabine-tenofovir AF (DESCOVY) 200-25 MG tablet Take 1 tablet by mouth daily. 30 tablet 11  . gabapentin (NEURONTIN) 300 MG capsule TAKE 1 CAPSULE BY MOUTH THREE TIMES A DAY AS NEEDED    . irbesartan-hydrochlorothiazide (AVALIDE) 150-12.5 MG tablet TAKE 1 TABLET BY MOUTH ONCE DAILY FOR BLOOD PRESSURE. (Patient taking differently: Take 1 tablet by mouth daily. ) 90 tablet 1  . levothyroxine (SYNTHROID, LEVOTHROID) 175 MCG tablet TAKE 1 TABLET BY MOUTH EVERY MORNING ON AN EMPTY STOMACH WITH A FULL GLASS OF WATER. (Patient taking differently: Take 175 mcg by mouth daily before breakfast. ) 30 tablet 5  . lidocaine-prilocaine (EMLA) cream Apply to affected area once 30 g 3  . LORazepam (ATIVAN) 0.5 MG tablet TAKE 1 TABLET (0.5 MG TOTAL) BY MOUTH AT BEDTIME AS NEEDED (NAUSEA OR VOMITING). 20 tablet 0  . metFORMIN (GLUCOPHAGE) 500 MG tablet Take 1 tablet (500 mg  total) by mouth 2 (two) times daily with a meal. 180 tablet 3  . metoprolol succinate (TOPROL-XL) 50 MG 24 hr tablet TAKE 1 TABLET (50 MG TOTAL) BY MOUTH DAILY. TAKE WITH OR IMMEDIATELY FOLLOWING A MEAL. 90 tablet 1  . Multiple Vitamin (MULTIVITAMIN) tablet Take 1 tablet by mouth daily.    . ONE TOUCH LANCETS MISC Use as instructed to test blood sugar daily 200 each 5  . ONETOUCH VERIO test strip USE AS INSTRUCTED TO TEST BLOOD SUGAR DAILY NEED APPOINTMENT FOR ANY MORE REFILLS 50 each 0  . oxyCODONE (OXY IR/ROXICODONE) 5 MG immediate release tablet Take 1 tablet (5 mg total) by mouth every 6 (six) hours as needed for severe pain. 12 tablet 0  . oxyCODONE-acetaminophen (PERCOCET/ROXICET) 5-325 MG tablet TAKE 1 TABLET BY MOUTH 4 TIMES A DAY AS NEEDED    . PROAIR RESPICLICK 563 (90 Base) MCG/ACT AEPB INHALE 2 PUFFS INTO THE LUNGS EVERY 6 HOURS AS NEEDED FOR WHEEZING OR SHORTNESS OF BREATH. 1 each 2  . prochlorperazine (COMPAZINE) 10 MG tablet TAKE 1 TABLET BY MOUTH EVERY 6 HOURS AS NEEDED FOR NAUSEA OR VOMITING 30 tablet 2  . spironolactone (ALDACTONE) 25 MG tablet Take 1 tablet (25 mg total) by mouth 2 (two) times daily. 60 tablet 3  . dexamethasone (DECADRON) 4 MG tablet Take 2 tablets (8 mg total) by mouth 2 (two) times daily. Start the day before Taxotere. Take once the day after, then 2 times a day x 2d. (Patient not taking: Reported on 12/03/2018) 30 tablet 1  . mometasone (NASONEX) 50 MCG/ACT nasal spray PLACE 2 SPRAYS INTO THE NOSE DAILY. (Patient not taking: Reported  on 11/16/2018) 17 g 3  . potassium chloride (K-DUR) 10 MEQ tablet Take 2 tablets (20 mEq total) by mouth 2 (two) times daily. (Patient not taking: Reported on 12/03/2018) 20 tablet 0   No current facility-administered medications for this visit.    Facility-Administered Medications Ordered in Other Visits  Medication Dose Route Frequency Provider Last Rate Last Dose  . sodium chloride flush (NS) 0.9 % injection 10 mL  10 mL  Intracatheter PRN Magrinat, Virgie Dad, MD   10 mL at 09/19/18 1551    OBJECTIVE:  Vitals:   12/06/18 1120  BP: 129/80  Pulse: 84  Resp: 18  Temp: 98.3 F (36.8 C)  SpO2: 100%     Body mass index is 38.27 kg/m.   Wt Readings from Last 3 Encounters:  12/06/18 230 lb (104.3 kg)  12/03/18 228 lb 12.8 oz (103.8 kg)  11/16/18 231 lb (104.8 kg)  ECOG FS:1 - Symptomatic but completely ambulatory GENERAL: Patient is a tired appearing woman wearing surgical mask HEENT:  Sclerae anicteric.  Oropharynx clear and moist. No ulcerations or evidence of oropharyngeal candidiasis. Neck is supple.  NODES:  No cervical, supraclavicular, or axillary lymphadenopathy palpated.  BREAST EXAM: see below LUNGS:  Clear to auscultation bilaterally.  No wheezes or rhonchi. HEART:  Regular rate and rhythm. No murmur appreciated. ABDOMEN:  Soft, nontender.  Positive, normoactive bowel sounds. No organomegaly palpated. MSK:  No focal spinal tenderness to palpation. Full range of motion bilaterally in the upper extremities. EXTREMITIES:  No peripheral edema.   SKIN:  Clear with no obvious rashes or skin changes. No nail dyscrasia. NEURO:  Nonfocal. Well oriented.  Appropriate affect.  12/06/2018 breast and axilla healing       LAB RESULTS:  CMP     Component Value Date/Time   NA 141 11/13/2018 0818   K 3.1 (L) 11/13/2018 0818   CL 103 11/13/2018 0818   CO2 28 11/13/2018 0818   GLUCOSE 136 (H) 11/13/2018 0818   BUN 18 11/13/2018 0818   CREATININE 1.35 (H) 11/13/2018 0818   CREATININE 1.28 (H) 05/10/2018 1458   CREATININE 1.02 09/23/2015 1047   CALCIUM 9.3 11/13/2018 0818   PROT 8.0 11/13/2018 0818   ALBUMIN 3.5 11/13/2018 0818   AST 12 (L) 11/13/2018 0818   AST 28 05/10/2018 1458   ALT 10 11/13/2018 0818   ALT 23 05/10/2018 1458   ALKPHOS 98 11/13/2018 0818   BILITOT 0.3 11/13/2018 0818   BILITOT <0.2 (L) 05/10/2018 1458   GFRNONAA 46 (L) 11/13/2018 0818   GFRNONAA 48 (L) 05/10/2018 1458    GFRAA 53 (L) 11/13/2018 0818   GFRAA 56 (L) 05/10/2018 1458    No results found for: TOTALPROTELP, ALBUMINELP, A1GS, A2GS, BETS, BETA2SER, GAMS, MSPIKE, SPEI  No results found for: KPAFRELGTCHN, LAMBDASER, KAPLAMBRATIO  Lab Results  Component Value Date   WBC 7.3 12/06/2018   NEUTROABS 3.6 12/06/2018   HGB 11.0 (L) 12/06/2018   HCT 34.4 (L) 12/06/2018   MCV 93.2 12/06/2018   PLT 330 12/06/2018    _0 @  No results found for: LABCA2  No components found for: [id number]  No results for input(s): INR in the last 168 hours.  No results found for: LABCA2  No results found for: VFI433  No results found for: IRJ188  No results found for: CZY606  No results found for: CA2729  No components found for: HGQUANT  No results found for: CEA1 / No results found for: CEA1   No results found  for: AFPTUMOR  No results found for: CHROMOGRNA  No results found for: PSA1  Appointment on 12/06/2018  Component Date Value Ref Range Status  . WBC 12/06/2018 7.3  4.0 - 10.5 K/uL Final  . RBC 12/06/2018 3.69* 3.87 - 5.11 MIL/uL Final  . Hemoglobin 12/06/2018 11.0* 12.0 - 15.0 g/dL Final  . HCT 12/06/2018 34.4* 36.0 - 46.0 % Final  . MCV 12/06/2018 93.2  80.0 - 100.0 fL Final  . MCH 12/06/2018 29.8  26.0 - 34.0 pg Final  . MCHC 12/06/2018 32.0  30.0 - 36.0 g/dL Final  . RDW 12/06/2018 16.7* 11.5 - 15.5 % Final  . Platelets 12/06/2018 330  150 - 400 K/uL Final  . nRBC 12/06/2018 0.0  0.0 - 0.2 % Final  . Neutrophils Relative % 12/06/2018 50  % Final  . Neutro Abs 12/06/2018 3.6  1.7 - 7.7 K/uL Final  . Lymphocytes Relative 12/06/2018 41  % Final  . Lymphs Abs 12/06/2018 3.0  0.7 - 4.0 K/uL Final  . Monocytes Relative 12/06/2018 7  % Final  . Monocytes Absolute 12/06/2018 0.5  0.1 - 1.0 K/uL Final  . Eosinophils Relative 12/06/2018 2  % Final  . Eosinophils Absolute 12/06/2018 0.2  0.0 - 0.5 K/uL Final  . Basophils Relative 12/06/2018 0  % Final  . Basophils Absolute  12/06/2018 0.0  0.0 - 0.1 K/uL Final  . Immature Granulocytes 12/06/2018 0  % Final  . Abs Immature Granulocytes 12/06/2018 0.02  0.00 - 0.07 K/uL Final   Performed at [facility], [address] [place], [place] [address]    (this displays the last labs from the last 3 days)  No results found for: TOTALPROTELP, ALBUMINELP, A1GS, A2GS, BETS, BETA2SER, GAMS, MSPIKE, SPEI (this displays SPEP labs)  No results found for: KPAFRELGTCHN, LAMBDASER, KAPLAMBRATIO (kappa/lambda light chains)  No results found for: HGBA, HGBA2QUANT, HGBFQUANT, HGBSQUAN (Hemoglobinopathy evaluation)   No results found for: LDH  Lab Results  Component Value Date   IRON 26 (L) 02/08/2007   TIBC 371 02/08/2007   IRONPCTSAT 7 (L) 02/08/2007   (Iron and TIBC)  No results found for: FERRITIN  Urinalysis    Component Value Date/Time   COLORURINE YELLOW 10/21/2014 2347   APPEARANCEUR CLOUDY (A) 10/21/2014 2347   LABSPEC 1.014 10/21/2014 2347   PHURINE 6.0 10/21/2014 2347   GLUCOSEU NEGATIVE 10/21/2014 2347   GLUCOSEU NEG mg/dL 01/23/2008 1418   HGBUR LARGE (A) 10/21/2014 2347   BILIRUBINUR negative 12/29/2017 [place] 10/21/2014 2347   PROTEINUR 2+ 12/29/2017 1046   PROTEINUR 100 (A) 10/21/2014 2347   UROBILINOGEN 0.2 12/29/2017 1046   UROBILINOGEN 0.2 10/21/2014 2347   NITRITE negative 12/29/2017 1046   NITRITE NEGATIVE 10/21/2014 2347   LEUKOCYTESUR Large (3+) (A) 12/29/2017 1046     STUDIES:   ELIGIBLE FOR AVAILABLE RESEARCH PROTOCOL: UPBEAT  ASSESSMENT: 53 y.o. , [place] woman status post right breast upper inner quadrant biopsy 04/26/2018 for a clinical T2 N0, stage Ib invasive ductal carcinoma, grade 2, with an MIB-1 of 70%.  (1) genetics testing 07/03/2018  (2) neoadjuvant chemotherapy will consist of carboplatin, docetaxel, trastuzumab and Pertuzumab given every 21 days x 6, starting 05/31/2018  (a) Pertuzumab discontinued starting with  cycle 2 because of diarrhea problems  (b) Taxotere stopped after 4 cycles due peripheral neuropathy and substituted for last two cycles with Gemcitabine  (3) trastuzumab adjuvantly today (10/23/18 and during radiation), TDM1 x 8 cycles planned adjuvantly due to  residual disease.  (a) echo on 05/16/2018 shows an EF of 60-65%.  (b) echo on 08/27/18 shows an EF 55-60%  (c) echo on 12/03/2018 shows an EF of 60-65%  (4) definitive surgery on 10/03/2018: IDC grade 1, 1.1 cm, DCIS, margins close but negative, 5 SLN negative.  [id number]  (5) adjuvant radiation to follow surgery (delayed due to wound healing issues)  (6) antiestrogens to start at the completion of local treatment  (7) hidradenitis/boils: on doxycycline daily  PLAN: Jisel is doing moderately well today. Her labs are stable, she recently underwent her echcoardiogram and she is tolerating TDM1 well so far.  She will proceed with this today.    Tyniah has not yet started adjuvant radiation due to wound healing delay.  I sent a message to Dr. Brantley Stage and Shona Simpson (Dr. Ida Rogue PA) to see where we stand with getting Kahlyn in with radiation.    Misheel will continue on TDM1 every three weeks.  She will see [place] every 6 weeks.   She knows to call for any questions or concerns prior to her next appointment with [place].    A total of (20) minutes of face-to-face time was spent with this patient with greater than 50% of that time in counseling and care-coordination.   Wilber Bihari, NP 12/06/18

## 2018-12-06 NOTE — Telephone Encounter (Signed)
No los °

## 2018-12-06 NOTE — Patient Instructions (Signed)
[facility] Discharge Instructions for Patients Receiving Chemotherapy  Today you received the following chemotherapy agents Kadcyla  To help prevent nausea and vomiting after your treatment, we encourage you to take your nausea medication as directed   If you develop nausea and vomiting that is not controlled by your nausea medication, call the clinic.   BELOW ARE SYMPTOMS THAT SHOULD BE REPORTED IMMEDIATELY:  *FEVER GREATER THAN 100.5 F  *CHILLS WITH OR WITHOUT FEVER  NAUSEA AND VOMITING THAT IS NOT CONTROLLED WITH YOUR NAUSEA MEDICATION  *UNUSUAL SHORTNESS OF BREATH  *UNUSUAL BRUISING OR BLEEDING  TENDERNESS IN MOUTH AND THROAT WITH OR WITHOUT PRESENCE OF ULCERS  *URINARY PROBLEMS  *BOWEL PROBLEMS  UNUSUAL RASH Items with * indicate a potential emergency and should be followed up as soon as possible.  Feel free to call the clinic should you have any questions or concerns. The clinic phone number is [phone].  Please show the CHEMO ALERT CARD at check-in to the Emergency Department and triage nurse.   

## 2018-12-06 NOTE — Progress Notes (Signed)
[id number]

## 2018-12-24 ENCOUNTER — Other Ambulatory Visit: Payer: Self-pay | Admitting: Primary Care

## 2018-12-24 DIAGNOSIS — I10 Essential (primary) hypertension: Secondary | ICD-10-CM

## 2018-12-27 ENCOUNTER — Inpatient Hospital Stay: Payer: 59

## 2018-12-27 ENCOUNTER — Other Ambulatory Visit: Payer: Self-pay | Admitting: Hematology and Oncology

## 2018-12-27 ENCOUNTER — Ambulatory Visit: Payer: 59 | Admitting: Internal Medicine

## 2018-12-27 ENCOUNTER — Inpatient Hospital Stay: Payer: 59 | Attending: Oncology

## 2018-12-27 ENCOUNTER — Other Ambulatory Visit: Payer: Self-pay | Admitting: Oncology

## 2018-12-27 ENCOUNTER — Other Ambulatory Visit: Payer: Self-pay

## 2018-12-27 VITALS — BP 153/89 | HR 80 | Temp 97.7°F | Resp 18 | Wt 231.8 lb

## 2018-12-27 DIAGNOSIS — Z170 Estrogen receptor positive status [ER+]: Secondary | ICD-10-CM

## 2018-12-27 DIAGNOSIS — C50211 Malignant neoplasm of upper-inner quadrant of right female breast: Secondary | ICD-10-CM

## 2018-12-27 DIAGNOSIS — C50311 Malignant neoplasm of lower-inner quadrant of right female breast: Secondary | ICD-10-CM

## 2018-12-27 DIAGNOSIS — Z95828 Presence of other vascular implants and grafts: Secondary | ICD-10-CM

## 2018-12-27 DIAGNOSIS — C73 Malignant neoplasm of thyroid gland: Secondary | ICD-10-CM

## 2018-12-27 DIAGNOSIS — Z5112 Encounter for antineoplastic immunotherapy: Secondary | ICD-10-CM | POA: Diagnosis not present

## 2018-12-27 LAB — COMPREHENSIVE METABOLIC PANEL
ALT: 18 U/L (ref 0–44)
AST: 25 U/L (ref 15–41)
Albumin: 3.1 g/dL — ABNORMAL LOW (ref 3.5–5.0)
Alkaline Phosphatase: 107 U/L (ref 38–126)
Anion gap: 11 (ref 5–15)
BUN: 18 mg/dL (ref 6–20)
CO2: 27 mmol/L (ref 22–32)
Calcium: 9.3 mg/dL (ref 8.9–10.3)
Chloride: 101 mmol/L (ref 98–111)
Creatinine, Ser: 1.26 mg/dL — ABNORMAL HIGH (ref 0.44–1.00)
GFR calc Af Amer: 58 mL/min — ABNORMAL LOW (ref >60)
GFR calc non Af Amer: 50 mL/min — ABNORMAL LOW (ref >60)
Glucose, Bld: 195 mg/dL — ABNORMAL HIGH (ref 70–99)
Potassium: 3.7 mmol/L (ref 3.5–5.1)
Sodium: 139 mmol/L (ref 135–145)
Total Bilirubin: 0.3 mg/dL (ref 0.3–1.2)
Total Protein: 8.1 g/dL (ref 6.5–8.1)

## 2018-12-27 LAB — CBC WITH DIFFERENTIAL/PLATELET
Abs Immature Granulocytes: 0.02 10*3/uL (ref 0.00–0.07)
BASOS ABS: 0 10*3/uL (ref 0.0–0.1)
Basophils Relative: 0 %
Eosinophils Absolute: 0.2 10*3/uL (ref 0.0–0.5)
Eosinophils Relative: 3 %
HCT: 36.2 % (ref 36.0–46.0)
Hemoglobin: 11.5 g/dL — ABNORMAL LOW (ref 12.0–15.0)
Immature Granulocytes: 0 %
Lymphocytes Relative: 35 %
Lymphs Abs: 2.7 10*3/uL (ref 0.7–4.0)
MCH: 29.1 pg (ref 26.0–34.0)
MCHC: 31.8 g/dL (ref 30.0–36.0)
MCV: 91.6 fL (ref 80.0–100.0)
Monocytes Absolute: 0.7 10*3/uL (ref 0.1–1.0)
Monocytes Relative: 9 %
NRBC: 0 % (ref 0.0–0.2)
Neutro Abs: 4.1 10*3/uL (ref 1.7–7.7)
Neutrophils Relative %: 53 %
Platelets: 325 10*3/uL (ref 150–400)
RBC: 3.95 MIL/uL (ref 3.87–5.11)
RDW: 16.9 % — ABNORMAL HIGH (ref 11.5–15.5)
WBC: 7.7 10*3/uL (ref 4.0–10.5)

## 2018-12-27 MED ORDER — DIPHENHYDRAMINE HCL 25 MG PO CAPS
50.0000 mg | ORAL_CAPSULE | Freq: Once | ORAL | Status: AC
  Administered 2018-12-27: 50 mg via ORAL

## 2018-12-27 MED ORDER — SODIUM CHLORIDE 0.9% FLUSH
10.0000 mL | INTRAVENOUS | Status: DC | PRN
  Administered 2018-12-27: 10 mL
  Filled 2018-12-27: qty 10

## 2018-12-27 MED ORDER — SODIUM CHLORIDE 0.9% FLUSH
10.0000 mL | INTRAVENOUS | Status: DC | PRN
  Filled 2018-12-27: qty 10

## 2018-12-27 MED ORDER — SODIUM CHLORIDE 0.9 % IV SOLN
360.0000 mg | Freq: Once | INTRAVENOUS | Status: AC
  Administered 2018-12-27: 360 mg via INTRAVENOUS
  Filled 2018-12-27: qty 8

## 2018-12-27 MED ORDER — SODIUM CHLORIDE 0.9 % IV SOLN
Freq: Once | INTRAVENOUS | Status: AC
  Administered 2018-12-27: 11:00:00 via INTRAVENOUS
  Filled 2018-12-27: qty 250

## 2018-12-27 MED ORDER — HEPARIN SOD (PORK) LOCK FLUSH 100 UNIT/ML IV SOLN
500.0000 [IU] | Freq: Once | INTRAVENOUS | Status: AC | PRN
  Administered 2018-12-27: 500 [IU]
  Filled 2018-12-27: qty 5

## 2018-12-27 MED ORDER — DIPHENHYDRAMINE HCL 25 MG PO CAPS
ORAL_CAPSULE | ORAL | Status: AC
  Filled 2018-12-27: qty 2

## 2018-12-27 MED ORDER — ACETAMINOPHEN 325 MG PO TABS
650.0000 mg | ORAL_TABLET | Freq: Once | ORAL | Status: AC
  Administered 2018-12-27: 650 mg via ORAL

## 2018-12-27 MED ORDER — ACETAMINOPHEN 325 MG PO TABS
ORAL_TABLET | ORAL | Status: AC
  Filled 2018-12-27: qty 2

## 2018-12-27 NOTE — Patient Instructions (Signed)
[facility] Discharge Instructions for Patients Receiving Chemotherapy  Today you received the following chemotherapy agents Kadcyla  To help prevent nausea and vomiting after your treatment, we encourage you to take your nausea medication as directed   If you develop nausea and vomiting that is not controlled by your nausea medication, call the clinic.   BELOW ARE SYMPTOMS THAT SHOULD BE REPORTED IMMEDIATELY:  *FEVER GREATER THAN 100.5 F  *CHILLS WITH OR WITHOUT FEVER  NAUSEA AND VOMITING THAT IS NOT CONTROLLED WITH YOUR NAUSEA MEDICATION  *UNUSUAL SHORTNESS OF BREATH  *UNUSUAL BRUISING OR BLEEDING  TENDERNESS IN MOUTH AND THROAT WITH OR WITHOUT PRESENCE OF ULCERS  *URINARY PROBLEMS  *BOWEL PROBLEMS  UNUSUAL RASH Items with * indicate a potential emergency and should be followed up as soon as possible.  Feel free to call the clinic should you have any questions or concerns. The clinic phone number is [phone].  Please show the CHEMO ALERT CARD at check-in to the Emergency Department and triage nurse.   

## 2018-12-28 ENCOUNTER — Other Ambulatory Visit: Payer: Self-pay

## 2018-12-28 ENCOUNTER — Ambulatory Visit
Admission: RE | Admit: 2018-12-28 | Discharge: 2018-12-28 | Disposition: A | Discharge status: Home / Self Care | Payer: 59 | Source: Ambulatory Visit | Attending: Radiation Oncology | Admitting: Radiation Oncology

## 2018-12-28 DIAGNOSIS — C50311 Malignant neoplasm of lower-inner quadrant of right female breast: Secondary | ICD-10-CM | POA: Insufficient documentation

## 2018-12-28 DIAGNOSIS — Z170 Estrogen receptor positive status [ER+]: Secondary | ICD-10-CM | POA: Diagnosis present

## 2019-01-04 DIAGNOSIS — C50311 Malignant neoplasm of lower-inner quadrant of right female breast: Secondary | ICD-10-CM | POA: Diagnosis not present

## 2019-01-07 ENCOUNTER — Ambulatory Visit
Admission: RE | Admit: 2019-01-07 | Discharge: 2019-01-07 | Disposition: A | Discharge status: Home / Self Care | Payer: 59 | Source: Ambulatory Visit | Attending: Radiation Oncology | Admitting: Radiation Oncology

## 2019-01-07 ENCOUNTER — Other Ambulatory Visit: Payer: Self-pay

## 2019-01-07 DIAGNOSIS — C50311 Malignant neoplasm of lower-inner quadrant of right female breast: Secondary | ICD-10-CM

## 2019-01-07 DIAGNOSIS — Z170 Estrogen receptor positive status [ER+]: Principal | ICD-10-CM

## 2019-01-07 MED ORDER — RADIAPLEXRX EX GEL
Freq: Once | CUTANEOUS | Status: AC
  Administered 2019-01-07: 14:00:00 via TOPICAL

## 2019-01-07 MED ORDER — ALRA NON-METALLIC DEODORANT (RAD-ONC)
1.0000 "application " | Freq: Once | TOPICAL | Status: AC
  Administered 2019-01-07: 1 via TOPICAL

## 2019-01-07 NOTE — Progress Notes (Signed)
Pt here for patient teaching.  Pt given Radiation and You booklet, skin care instructions, Alra deodorant and Radiaplex gel.  Reviewed areas of pertinence such as fatigue, hair loss, skin changes, breast tenderness and breast swelling . Pt able to give teach back of to pat skin and use unscented/gentle soap,apply Radiaplex bid, avoid applying anything to skin within 4 hours of treatment, avoid wearing an under wire bra and to use an electric razor if they must shave. Pt verbalizes understanding of information given and will contact nursing with any questions or concerns.     Sumiko Ceasar M. Jamani Bearce RN, BSN      

## 2019-01-08 ENCOUNTER — Other Ambulatory Visit: Payer: Self-pay

## 2019-01-08 ENCOUNTER — Telehealth: Payer: Self-pay | Admitting: *Deleted

## 2019-01-08 ENCOUNTER — Ambulatory Visit
Admission: RE | Admit: 2019-01-08 | Discharge: 2019-01-08 | Disposition: A | Discharge status: Home / Self Care | Payer: 59 | Source: Ambulatory Visit | Attending: Radiation Oncology | Admitting: Radiation Oncology

## 2019-01-08 DIAGNOSIS — C50311 Malignant neoplasm of lower-inner quadrant of right female breast: Secondary | ICD-10-CM | POA: Diagnosis not present

## 2019-01-08 MED ORDER — FLUCONAZOLE 100 MG PO TABS: ORAL_TABLET | ORAL | 2 refills | Status: DC

## 2019-01-08 NOTE — Telephone Encounter (Signed)
[name] called to this RN to state she is having an outbreak of yeast ( vaginal ) with itching and discharge secondary to ABX prescribed by this office.  Above discussed and prescription sent to verified pharmacy for diflucan.

## 2019-01-09 ENCOUNTER — Other Ambulatory Visit: Payer: Self-pay

## 2019-01-09 ENCOUNTER — Ambulatory Visit
Admission: RE | Admit: 2019-01-09 | Discharge: 2019-01-09 | Disposition: A | Discharge status: Home / Self Care | Payer: 59 | Source: Ambulatory Visit | Attending: Radiation Oncology | Admitting: Radiation Oncology

## 2019-01-09 DIAGNOSIS — C50311 Malignant neoplasm of lower-inner quadrant of right female breast: Secondary | ICD-10-CM | POA: Diagnosis not present

## 2019-01-10 ENCOUNTER — Ambulatory Visit
Admission: RE | Admit: 2019-01-10 | Discharge: 2019-01-10 | Disposition: A | Discharge status: Home / Self Care | Payer: 59 | Source: Ambulatory Visit | Attending: Radiation Oncology | Admitting: Radiation Oncology

## 2019-01-10 ENCOUNTER — Other Ambulatory Visit: Payer: Self-pay

## 2019-01-10 DIAGNOSIS — C50311 Malignant neoplasm of lower-inner quadrant of right female breast: Secondary | ICD-10-CM | POA: Diagnosis not present

## 2019-01-11 ENCOUNTER — Other Ambulatory Visit: Payer: Self-pay

## 2019-01-11 ENCOUNTER — Ambulatory Visit
Admission: RE | Admit: 2019-01-11 | Discharge: 2019-01-11 | Disposition: A | Discharge status: Home / Self Care | Payer: 59 | Source: Ambulatory Visit | Attending: Radiation Oncology | Admitting: Radiation Oncology

## 2019-01-11 DIAGNOSIS — C50311 Malignant neoplasm of lower-inner quadrant of right female breast: Secondary | ICD-10-CM | POA: Diagnosis not present

## 2019-01-14 ENCOUNTER — Other Ambulatory Visit: Payer: Self-pay

## 2019-01-14 ENCOUNTER — Ambulatory Visit
Admission: RE | Admit: 2019-01-14 | Discharge: 2019-01-14 | Disposition: A | Discharge status: Home / Self Care | Payer: 59 | Source: Ambulatory Visit | Attending: Radiation Oncology | Admitting: Radiation Oncology

## 2019-01-14 DIAGNOSIS — C50311 Malignant neoplasm of lower-inner quadrant of right female breast: Secondary | ICD-10-CM | POA: Diagnosis not present

## 2019-01-15 ENCOUNTER — Ambulatory Visit
Admission: RE | Admit: 2019-01-15 | Discharge: 2019-01-15 | Disposition: A | Discharge status: Home / Self Care | Payer: 59 | Source: Ambulatory Visit | Attending: Radiation Oncology | Admitting: Radiation Oncology

## 2019-01-15 ENCOUNTER — Other Ambulatory Visit: Payer: Self-pay

## 2019-01-15 DIAGNOSIS — C50311 Malignant neoplasm of lower-inner quadrant of right female breast: Secondary | ICD-10-CM | POA: Diagnosis not present

## 2019-01-16 ENCOUNTER — Other Ambulatory Visit: Payer: Self-pay

## 2019-01-16 ENCOUNTER — Ambulatory Visit
Admission: RE | Admit: 2019-01-16 | Discharge: 2019-01-16 | Disposition: A | Discharge status: Home / Self Care | Payer: 59 | Source: Ambulatory Visit | Attending: Radiation Oncology | Admitting: Radiation Oncology

## 2019-01-16 DIAGNOSIS — C50311 Malignant neoplasm of lower-inner quadrant of right female breast: Secondary | ICD-10-CM

## 2019-01-16 DIAGNOSIS — Z9889 Other specified postprocedural states: Secondary | ICD-10-CM | POA: Diagnosis present

## 2019-01-16 DIAGNOSIS — M25511 Pain in right shoulder: Secondary | ICD-10-CM | POA: Diagnosis present

## 2019-01-16 DIAGNOSIS — Z170 Estrogen receptor positive status [ER+]: Secondary | ICD-10-CM

## 2019-01-16 DIAGNOSIS — R208 Other disturbances of skin sensation: Secondary | ICD-10-CM | POA: Diagnosis present

## 2019-01-17 ENCOUNTER — Inpatient Hospital Stay: Payer: 59

## 2019-01-17 ENCOUNTER — Other Ambulatory Visit: Payer: Self-pay

## 2019-01-17 ENCOUNTER — Other Ambulatory Visit: Payer: Self-pay | Admitting: *Deleted

## 2019-01-17 ENCOUNTER — Encounter: Payer: Self-pay | Admitting: Adult Health

## 2019-01-17 ENCOUNTER — Ambulatory Visit
Admission: RE | Admit: 2019-01-17 | Discharge: 2019-01-17 | Disposition: A | Discharge status: Home / Self Care | Payer: 59 | Source: Ambulatory Visit | Attending: Radiation Oncology | Admitting: Radiation Oncology

## 2019-01-17 ENCOUNTER — Inpatient Hospital Stay (HOSPITAL_BASED_OUTPATIENT_CLINIC_OR_DEPARTMENT_OTHER): Payer: 59 | Admitting: Adult Health

## 2019-01-17 VITALS — BP 135/89 | HR 79 | Temp 97.9°F | Resp 18 | Ht 65.0 in | Wt 232.0 lb

## 2019-01-17 DIAGNOSIS — C50311 Malignant neoplasm of lower-inner quadrant of right female breast: Secondary | ICD-10-CM | POA: Diagnosis not present

## 2019-01-17 DIAGNOSIS — Z170 Estrogen receptor positive status [ER+]: Principal | ICD-10-CM

## 2019-01-17 DIAGNOSIS — Z808 Family history of malignant neoplasm of other organs or systems: Secondary | ICD-10-CM

## 2019-01-17 DIAGNOSIS — C73 Malignant neoplasm of thyroid gland: Secondary | ICD-10-CM

## 2019-01-17 DIAGNOSIS — L732 Hidradenitis suppurativa: Secondary | ICD-10-CM | POA: Diagnosis not present

## 2019-01-17 DIAGNOSIS — R530 Neoplastic (malignant) related fatigue: Secondary | ICD-10-CM

## 2019-01-17 DIAGNOSIS — C50211 Malignant neoplasm of upper-inner quadrant of right female breast: Secondary | ICD-10-CM

## 2019-01-17 DIAGNOSIS — Z87891 Personal history of nicotine dependence: Secondary | ICD-10-CM

## 2019-01-17 DIAGNOSIS — Z803 Family history of malignant neoplasm of breast: Secondary | ICD-10-CM

## 2019-01-17 DIAGNOSIS — Z801 Family history of malignant neoplasm of trachea, bronchus and lung: Secondary | ICD-10-CM

## 2019-01-17 DIAGNOSIS — Z95828 Presence of other vascular implants and grafts: Secondary | ICD-10-CM

## 2019-01-17 LAB — COMPREHENSIVE METABOLIC PANEL
ALT: 24 U/L (ref 0–44)
AST: 30 U/L (ref 15–41)
Albumin: 3 g/dL — ABNORMAL LOW (ref 3.5–5.0)
Alkaline Phosphatase: 109 U/L (ref 38–126)
Anion gap: 9 (ref 5–15)
BUN: 17 mg/dL (ref 6–20)
CO2: 30 mmol/L (ref 22–32)
Calcium: 9.4 mg/dL (ref 8.9–10.3)
Chloride: 102 mmol/L (ref 98–111)
Creatinine, Ser: 1.33 mg/dL — ABNORMAL HIGH (ref 0.44–1.00)
GFR calc Af Amer: 54 mL/min — ABNORMAL LOW (ref >60)
GFR calc non Af Amer: 46 mL/min — ABNORMAL LOW (ref >60)
Glucose, Bld: 158 mg/dL — ABNORMAL HIGH (ref 70–99)
Potassium: 3.7 mmol/L (ref 3.5–5.1)
Sodium: 141 mmol/L (ref 135–145)
Total Bilirubin: <0.2 mg/dL — ABNORMAL LOW (ref 0.3–1.2)
Total Protein: 8.1 g/dL (ref 6.5–8.1)

## 2019-01-17 LAB — CBC WITH DIFFERENTIAL/PLATELET
Abs Immature Granulocytes: 0.02 10*3/uL (ref 0.00–0.07)
Basophils Absolute: 0 10*3/uL (ref 0.0–0.1)
Basophils Relative: 1 %
Eosinophils Absolute: 0.2 10*3/uL (ref 0.0–0.5)
Eosinophils Relative: 3 %
HCT: 33.8 % — ABNORMAL LOW (ref 36.0–46.0)
Hemoglobin: 11.1 g/dL — ABNORMAL LOW (ref 12.0–15.0)
Immature Granulocytes: 0 %
Lymphocytes Relative: 34 %
Lymphs Abs: 2.1 10*3/uL (ref 0.7–4.0)
MCH: 29.5 pg (ref 26.0–34.0)
MCHC: 32.8 g/dL (ref 30.0–36.0)
MCV: 89.9 fL (ref 80.0–100.0)
Monocytes Absolute: 0.6 10*3/uL (ref 0.1–1.0)
Monocytes Relative: 9 %
Neutro Abs: 3.2 10*3/uL (ref 1.7–7.7)
Neutrophils Relative %: 53 %
Platelets: 278 10*3/uL (ref 150–400)
RBC: 3.76 MIL/uL — ABNORMAL LOW (ref 3.87–5.11)
RDW: 17.5 % — ABNORMAL HIGH (ref 11.5–15.5)
WBC: 6.1 10*3/uL (ref 4.0–10.5)
nRBC: 0 % (ref 0.0–0.2)

## 2019-01-17 MED ORDER — SODIUM CHLORIDE 0.9 % IV SOLN
360.0000 mg | Freq: Once | INTRAVENOUS | Status: AC
  Administered 2019-01-17: 360 mg via INTRAVENOUS
  Filled 2019-01-17: qty 10

## 2019-01-17 MED ORDER — SODIUM CHLORIDE 0.9 % IV SOLN
Freq: Once | INTRAVENOUS | Status: AC
  Administered 2019-01-17: 14:00:00 via INTRAVENOUS
  Filled 2019-01-17: qty 250

## 2019-01-17 MED ORDER — ACETAMINOPHEN 325 MG PO TABS
ORAL_TABLET | ORAL | Status: AC
  Filled 2019-01-17: qty 2

## 2019-01-17 MED ORDER — SODIUM CHLORIDE 0.9% FLUSH
10.0000 mL | INTRAVENOUS | Status: DC | PRN
  Administered 2019-01-17: 13:00:00 10 mL
  Filled 2019-01-17: qty 10

## 2019-01-17 MED ORDER — HEPARIN SOD (PORK) LOCK FLUSH 100 UNIT/ML IV SOLN
500.0000 [IU] | Freq: Once | INTRAVENOUS | Status: DC | PRN
  Filled 2019-01-17: qty 5

## 2019-01-17 MED ORDER — SODIUM CHLORIDE 0.9% FLUSH
10.0000 mL | INTRAVENOUS | Status: DC | PRN
  Filled 2019-01-17: qty 10

## 2019-01-17 MED ORDER — DIPHENHYDRAMINE HCL 25 MG PO CAPS
50.0000 mg | ORAL_CAPSULE | Freq: Once | ORAL | Status: AC
  Administered 2019-01-17: 50 mg via ORAL

## 2019-01-17 MED ORDER — ACETAMINOPHEN 325 MG PO TABS
650.0000 mg | ORAL_TABLET | Freq: Once | ORAL | Status: AC
  Administered 2019-01-17: 650 mg via ORAL

## 2019-01-17 MED ORDER — DIPHENHYDRAMINE HCL 25 MG PO CAPS
ORAL_CAPSULE | ORAL | Status: AC
  Filled 2019-01-17: qty 2

## 2019-01-17 NOTE — Patient Instructions (Signed)
[facility] Discharge Instructions for Patients Receiving Chemotherapy  Today you received the following chemotherapy agents [place]  To help prevent nausea and vomiting after your treatment, we encourage you to take your nausea medication as directed   If you develop nausea and vomiting that is not controlled by your nausea medication, call the clinic.   BELOW ARE SYMPTOMS THAT SHOULD BE REPORTED IMMEDIATELY:  *FEVER GREATER THAN 100.5 F  *CHILLS WITH OR WITHOUT FEVER  NAUSEA AND VOMITING THAT IS NOT CONTROLLED WITH YOUR NAUSEA MEDICATION  *UNUSUAL SHORTNESS OF BREATH  *UNUSUAL BRUISING OR BLEEDING  TENDERNESS IN MOUTH AND THROAT WITH OR WITHOUT PRESENCE OF ULCERS  *URINARY PROBLEMS  *BOWEL PROBLEMS  UNUSUAL RASH Items with * indicate a potential emergency and should be followed up as soon as possible.  Feel free to call the clinic should you have any questions or concerns. The clinic phone number is [phone].  Please show the CHEMO ALERT CARD at check-in to the Emergency Department and triage nurse.   

## 2019-01-17 NOTE — Progress Notes (Signed)
[place]  Telephone:(336) [phone] Fax:(336) [phone]     ID: [name] DOB: 01-Oct-1966  MR#: [id number]  JYN#:[id number]  Patient Care Team: [name], NP as PCP - General (Internal Medicine) Michel Bickers, MD as PCP - Infectious Diseases (Infectious Diseases) Magrinat, Virgie Dad, MD as Consulting Physician (Oncology) Kyung Rudd, MD as Consulting Physician (Radiation Oncology) OTHER MD:   CHIEF COMPLAINT: Triple positive breast cancer  CURRENT TREATMENT: Adjuvant immunotherapy/adjuvant radiation  INTERVAL HISTORY: [name] returns today for follow up and treatment of her triple positive breast cancer.  She is here to receive adjuvant TDM1.   She notes she is tolerating it well.    Since her last visit she underwent an echocardiogram on 12/03/2018 that showed a well preserved ejection fraction of 60-65%.    She started on radiation on 01/07/2019.  She notes she is tolerating it well, but is making her fatigued.    REVIEW OF SYSTEMS: [name] is tired today.  She continues to work.  She notes that she has an essential job, and cannot work from home and needs the money to live.  She is taking the proper precuations to protect herself from others and is practicing sanitizing and social distancing.    [name] denies any fevers, chills, chest pain, cough, shortness of breath, palpitations, headaches, bowel/bladder changes, or any other concerns.  A detailed ROS was otherwise non contributory.    HISTORY OF CURRENT ILLNESS: From the original intake note:  [name] (pronounced "Huff") palpated a mass in the right breast for about 2 weeks before bringing it to medical attention. She underwent bilateral diagnostic mammography with tomography and right breast ultrasonography at [facility] on 04/20/2018 showing: breast density category B. There was a highly suspicious mass located at 3 o'clock in the upper inner quadrant measuring 2.9 x 2.5 x  1.6 cm and 4 cm from the nipple. Ultrasound of the right axilla showed 5 lymph nodes with abnormal cortical thickening. The left axilla showed 3 lymph nodes with abnormal cortical thickening.  Accordingly on 04/26/2018 she proceeded to biopsy of the right breast area in question. The pathology from this procedure showed ([id number]): Invasive ductal carcinoma, grade II with calcifications. One right axillary lymph node and one left axillary lymph node were negative for carcinoma. Prognostic indicators significant for: estrogen receptor, 100% positive and progesterone receptor, 80% positive, both with strong staining intensity. Proliferation marker Ki67 at 70%. HER2 amplified with ratios HER2/CEP17 signals 4.52 and average HER2 copies per cell 10.85  The patient's subsequent history is as detailed below.  PAST MEDICAL HISTORY: Past Medical History:  Diagnosis Date  . Allergy   . Anemia    Normocytic  . Anxiety   . Asthma   . Blood dyscrasia   . Bronchitis 2005  . 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 Diabetes mellitus without complication ([place])   . Family history of breast cancer   . Family history of lung cancer   . Family history of prostate cancer   . Gastroenteritis 07/10/07  . GERD 07/24/2006  . GRAVE'S DISEASE 01/01/2008  . History of hidradenitis suppurativa   . History of kidney stones   . History of thrush   . HIV DISEASE 07/24/2006   dx March 05  . Hyperlipidemia   . HYPERTENSION 07/24/2006  . Hyperthyroidism 08/2006   Grave's Disease -diffuse radiotracer uptake 08/25/06 Thyroid scan-Cold nodule to R lower lobe of thyrorid  . Menometrorrhagia    hx of  . Nephrolithiasis   .  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 Pneumonia 2005  . 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 Sarcoidosis 02/08/2007   dx as a teenager in [place] from abnl CXR. Completed 2 yrs Prednisone after lung bx confirmation. No  symptoms since then.  . Suppurative hidradenitis   . Thyroid cancer ([place])   . THYROID NODULE, RIGHT 02/08/2007  -2018 thyroid nodules were precancerous but pathology was negative for thyroid cancer.   PAST SURGICAL HISTORY: Past Surgical History:  Procedure Laterality Date  . BREAST LUMPECTOMY WITH RADIOACTIVE SEED AND SENTINEL LYMPH NODE BIOPSY Right 10/03/2018   Procedure: RIGHT BREAST LUMPECTOMY WITH RADIOACTIVE SEED AND SENTINEL LYMPH NODE MAPPING;  Surgeon: Erroll Luna, MD;  Location: [facility];  Service: General;  Laterality: Right;  . BREAST SURGERY  1997   Breast Reduction   . CYSTOSCOPY W/ URETERAL STENT REMOVAL  11/09/2012   Procedure: CYSTOSCOPY WITH STENT REMOVAL;  Surgeon: Alexis Frock, MD;  Location: [facility];  Service: Urology;  Laterality: Right;  . CYSTOSCOPY WITH RETROGRADE PYELOGRAM, URETEROSCOPY AND STENT PLACEMENT  11/09/2012   Procedure: CYSTOSCOPY WITH RETROGRADE PYELOGRAM, URETEROSCOPY AND STENT PLACEMENT;  Surgeon: Alexis Frock, MD;  Location[facility];  Service: Urology;  Laterality: Left;  LEFT URETEROSCOPY, STONE MANIPULATION, left STENT exchange   . CYSTOSCOPY WITH STENT PLACEMENT  10/02/2012   Procedure: CYSTOSCOPY WITH STENT PLACEMENT;  Surgeon: Alexis Frock, MD;  [facility];  Service: Urology;  Laterality: Left;  . DILATION AND CURETTAGE OF UTERUS  Feb 2004   s/p for 1st trimester nonviable pregnancy  . EYE SURGERY     sty under eyelid  . INCISE AND DRAIN ABCESS  Nov 03   s/p I &D for righ inframmary fold hidradenitis  . INCISION AND DRAINAGE PERITONSILLAR ABSCESS  Mar 03  . IR CV LINE INJECTION  06/07/2018  . IR IMAGING GUIDED PORT INSERTION  06/20/2018  . IR REMOVAL TUN ACCESS W/ PORT W/O FL MOD SED  06/20/2018  . IRRIGATION AND DEBRIDEMENT ABSCESS  01/31/2012   Procedure: IRRIGATION AND DEBRIDEMENT ABSCESS;  Surgeon: Shann Medal, MD;  Location[facility];  Service: General;  Laterality: Right;  right breast and axilla   . NEPHROLITHOTOMY  10/02/2012    Procedure: NEPHROLITHOTOMY PERCUTANEOUS;  Surgeon: Alexis Frock, MD;  [facility];  Service: Urology;  Laterality: Right;  First Stage Percutaneous Nephrolithotomy with Surgeon Access, Left Ureteral Stent    . NEPHROLITHOTOMY  10/04/2012   Procedure: NEPHROLITHOTOMY PERCUTANEOUS SECOND LOOK;  Surgeon: Alexis Frock, MD;  [facility];  Service: Urology;  Laterality: Right;     . NEPHROLITHOTOMY  10/08/2012   Procedure: NEPHROLITHOTOMY PERCUTANEOUS;  Surgeon: Alexis Frock, MD;  Location[facility];  Service: Urology;  Laterality: Right;  THIRD STAGE, nephrostomy tube exchange x 2  . NEPHROLITHOTOMY  10/11/2012   Procedure: NEPHROLITHOTOMY PERCUTANEOUS SECOND LOOK;  Surgeon: Alexis Frock, MD;  [facility];  Service: Urology;  Laterality: Right;  RIGHT 4 STAGE PERCUTANOUS NEPHROLITHOTOMY, right URETEROSCOPY WITH HOLMIUM LASER   . PORTACATH PLACEMENT Left 05/17/2018   Procedure: INSERTION PORT-A-CATH;  Surgeon: Coralie Keens, MD;  Location: [facility];  Service: General;  Laterality: Left;  . RADICAL NECK DISSECTION  04/12/2017   limited/notes 04/12/2017  . RADICAL NECK DISSECTION N/A 04/12/2017   Procedure: RADICAL NECK DISSECTION;  Surgeon: Melida Quitter, MD;  Location: [facility];  Service: ENT;  Laterality: N/A;  limited neck dissection 2 hours total  . REDUCTION MAMMAPLASTY Bilateral  [place]  . THYROIDECTOMY  04/12/2017   completion/notes 04/12/2017  . THYROIDECTOMY N/A 04/12/2017   Procedure: THYROIDECTOMY;  Surgeon: Melida Quitter, MD;  Location: [facility];  Service: ENT;  Laterality: N/A;  Completion Thyroidectomy  . TOTAL THYROIDECTOMY  2010  Thyroid nodules removed- pathology at the ENT doctor 2018 were benign -Over active thyroid and thyroidectomy with benign pathology  FAMILY HISTORY Family History  Problem Relation Age of Onset  . Hypertension Mother   . Cancer Mother        laryngeal  . Heart disease Mother        stent  . Hypertension  Father   . Lung cancer Father 16       hx smoking  . Heart disease Other   . Hypertension Other   . Stroke Other        Grandparent  . Kidney disease Other        Grandparent  . Diabetes Other        FH of Diabetes  . Hypertension Sister   . Cancer Maternal Uncle        Lung CA  . Hypertension Brother   . Hypertension Sister   . Breast cancer Maternal Aunt 65  . Breast cancer Paternal Aunt 74  . Prostate cancer Paternal Uncle   . Breast cancer Maternal Aunt        dx 60+  . Breast cancer Paternal Aunt        dx 18's  . Breast cancer Paternal Aunt        dx 88's  . Prostate cancer Paternal Uncle   . Lung cancer Paternal Uncle   . Breast cancer Cousin 62  . Breast cancer Cousin        dx <50  . Breast cancer Cousin        dx <50  . Breast cancer Cousin        dx <50  . Colon cancer Neg Hx   . Esophageal cancer Neg Hx   . Rectal cancer Neg Hx   . Stomach cancer Neg Hx   The patient's mother is alive at age 96. The patient's father died at 23 from lung cancer (heavy smoker). She does not know much about her father. The patient has 1 brother and 2 sisters. The patient's mother had a history of laryngeal cancer. There was a maternal cousin with breast cancer diagnosed at age 41. There were 2 maternal aunts with breast cancer, the youngest being diagnosed in the 42's. There was a maternal great aunt with breast cancer. There were 2 paternal aunts with breast cancer.    GYNECOLOGIC HISTORY:  Patient's last menstrual period was 03/31/2014. Menarche: 53 years old Age at first live birth: 53 years old She is GX P1.  Her LMP was in 2017. She did not use HRT.    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 At home is the patient's daughter, Verdie Drown, age 4, who is a Engineer, technical sales education and working part-time at the [employer]. The patient attends [employer].     ADVANCED DIRECTIVES: Not in place; at the 05/10/2018 visit the  patient was given the appropriate documents to complete and notarized at her discretion   HEALTH MAINTENANCE: Social History   Tobacco Use  . Smoking status: Former Smoker    Packs/day: 0.50    Years: 15.00    Pack years: 7.50    Types: Cigarettes  Start date: 04/12/2017  . Smokeless tobacco: Never Used  . Tobacco comment: quit smoking  may 2018  Substance Use Topics  . Alcohol use: Yes    Alcohol/week: 0.0 standard drinks    Comment: social  . Drug use: No     Colonoscopy: no  PAP: [facility]. 2018  Bone density: remote   Allergies  Allergen Reactions  . Genvoya [Elviteg-Cobic-Emtricit-Tenofaf] Hives  . Lisinopril Cough  . Valsartan Other (See Comments) and Cough    Pt states she tolerates medicine now    Current Outpatient Medications  Medication Sig Dispense Refill  . amLODipine (NORVASC) 10 MG tablet Take 1 tablet (10 mg total) by mouth daily. For blood pressure. 90 tablet 3  . atorvastatin (LIPITOR) 20 MG tablet Take 1 tablet by mouth every evening for high cholesterol. NEED APPOINTMENT FOR ANY MORE REFILLS (Patient taking differently: Take 20 mg by mouth every evening. ) 30 tablet 0  . dolutegravir ([name]) 50 MG tablet Take 1 tablet (50 mg total) by mouth daily. 30 tablet 11  . doxycycline (VIBRA-TABS) 100 MG tablet 100 mg daily.     Marland Kitchen emtricitabine-tenofovir AF (DESCOVY) 200-25 MG tablet Take 1 tablet by mouth daily. 30 tablet 11  . fluconazole (DIFLUCAN) 100 MG tablet 2 tablets for first dose then 1 tab a day x 2 days 4 tablet 2  . gabapentin (NEURONTIN) 300 MG capsule TAKE 1 CAPSULE BY MOUTH THREE TIMES A DAY AS NEEDED    . irbesartan-hydrochlorothiazide (AVALIDE) 150-12.5 MG tablet TAKE 1 TABLET BY MOUTH ONCE DAILY FOR BLOOD PRESSURE. 90 tablet 1  . levothyroxine (SYNTHROID, LEVOTHROID) 175 MCG tablet TAKE 1 TABLET BY MOUTH EVERY MORNING ON AN EMPTY STOMACH WITH A FULL GLASS OF WATER. (Patient taking differently: Take 175 mcg by mouth daily before  breakfast. ) 30 tablet 5  . metFORMIN (GLUCOPHAGE) 500 MG tablet Take 1 tablet (500 mg total) by mouth 2 (two) times daily with a meal. 180 tablet 3  . metoprolol succinate (TOPROL-XL) 50 MG 24 hr tablet TAKE 1 TABLET (50 MG TOTAL) BY MOUTH DAILY. TAKE WITH OR IMMEDIATELY FOLLOWING A MEAL. 90 tablet 1  . mometasone (NASONEX) 50 MCG/ACT nasal spray PLACE 2 SPRAYS INTO THE NOSE DAILY. 17 g 3  . Multiple Vitamin (MULTIVITAMIN) tablet Take 1 tablet by mouth daily.    . ONE TOUCH LANCETS MISC Use as instructed to test blood sugar daily 200 each 5  . ONETOUCH VERIO test strip USE AS INSTRUCTED TO TEST BLOOD SUGAR DAILY NEED APPOINTMENT FOR ANY MORE REFILLS 50 each 0  . oxyCODONE (OXY IR/ROXICODONE) 5 MG immediate release tablet Take 1 tablet (5 mg total) by mouth every 6 (six) hours as needed for severe pain. 12 tablet 0  . oxyCODONE-acetaminophen (PERCOCET/ROXICET) 5-325 MG tablet TAKE 1 TABLET BY MOUTH 4 TIMES A DAY AS NEEDED    . potassium chloride (K-DUR) 10 MEQ tablet Take 2 tablets (20 mEq total) by mouth 2 (two) times daily. 20 tablet 0  . PROAIR RESPICLICK 175 (90 Base) MCG/ACT AEPB INHALE 2 PUFFS INTO THE LUNGS EVERY 6 HOURS AS NEEDED FOR WHEEZING OR SHORTNESS OF BREATH. 1 each 2  . spironolactone (ALDACTONE) 25 MG tablet Take 1 tablet (25 mg total) by mouth 2 (two) times daily. 60 tablet 3   No current facility-administered medications for this visit.    Facility-Administered Medications Ordered in Other Visits  Medication Dose Route Frequency Provider Last Rate Last Dose  . sodium chloride flush (NS) 0.9 %  injection 10 mL  10 mL Intracatheter PRN Magrinat, Virgie Dad, MD   10 mL at 09/19/18 1551    OBJECTIVE:  Vitals:   01/17/19 1338  BP: 135/89  Pulse: 79  Resp: 18  Temp: 97.9 F (36.6 C)  SpO2: 98%     Body mass index is 38.61 kg/m.   Wt Readings from Last 3 Encounters:  01/17/19 232 lb (105.2 kg)  12/27/18 231 lb 12 oz (105.1 kg)  12/06/18 230 lb (104.3 kg)  ECOG FS:1 -  Symptomatic but completely ambulatory GENERAL: Patient is a tired appearing woman wearing surgical mask HEENT:  Sclerae anicteric.  Oropharynx clear and moist. No ulcerations or evidence of oropharyngeal candidiasis. Neck is supple.  NODES:  No cervical, supraclavicular, or axillary lymphadenopathy palpated.  BREAST EXAM: declined by patient today LUNGS:  Clear to auscultation bilaterally.  No wheezes or rhonchi. HEART:  Regular rate and rhythm. No murmur appreciated. ABDOMEN:  Soft, nontender.  Positive, normoactive bowel sounds. No organomegaly palpated. MSK:  No focal spinal tenderness to palpation. Full range of motion bilaterally in the upper extremities. EXTREMITIES:  No peripheral edema.   SKIN:  Clear with no obvious rashes or skin changes. No nail dyscrasia. NEURO:  Nonfocal. Well oriented.  Appropriate affect.       LAB RESULTS:  CMP     Component Value Date/Time   NA 141 01/17/2019 1217   K 3.7 01/17/2019 1217   CL 102 01/17/2019 1217   CO2 30 01/17/2019 1217   GLUCOSE 158 (H) 01/17/2019 1217   BUN 17 01/17/2019 1217   CREATININE 1.33 (H) 01/17/2019 1217   CREATININE 1.28 (H) 05/10/2018 1458   CREATININE 1.02 09/23/2015 1047   CALCIUM 9.4 01/17/2019 1217   PROT 8.1 01/17/2019 1217   ALBUMIN 3.0 (L) 01/17/2019 1217   AST 30 01/17/2019 1217   AST 28 05/10/2018 1458   ALT 24 01/17/2019 1217   ALT 23 05/10/2018 1458   ALKPHOS 109 01/17/2019 1217   BILITOT <0.2 (L) 01/17/2019 1217   BILITOT <0.2 (L) 05/10/2018 1458   GFRNONAA 46 (L) 01/17/2019 1217   GFRNONAA 48 (L) 05/10/2018 1458   GFRAA 54 (L) 01/17/2019 1217   GFRAA 56 (L) 05/10/2018 1458    No results found for: TOTALPROTELP, ALBUMINELP, A1GS, A2GS, BETS, BETA2SER, GAMS, MSPIKE, SPEI  No results found for: KPAFRELGTCHN, LAMBDASER, KAPLAMBRATIO  Lab Results  Component Value Date   WBC 6.1 01/17/2019   NEUTROABS 3.2 01/17/2019   HGB 11.1 (L) 01/17/2019   HCT 33.8 (L) 01/17/2019   MCV 89.9 01/17/2019    PLT 278 01/17/2019    _0 @  No results found for: LABCA2  No components found for: IHKVQQ595  No results for input(s): INR in the last 168 hours.  No results found for: LABCA2  No results found for: GLO756  No results found for: EPP295  No results found for: JOA416  No results found for: CA2729  No components found for: HGQUANT  No results found for: CEA1 / No results found for: CEA1   No results found for: AFPTUMOR  No results found for: CHROMOGRNA  No results found for: PSA1  Appointment on 01/17/2019  Component Date Value Ref Range Status  . Sodium 01/17/2019 141  135 - 145 mmol/L Final  . Potassium 01/17/2019 3.7  3.5 - 5.1 mmol/L Final  . Chloride 01/17/2019 102  98 - 111 mmol/L Final  . CO2 01/17/2019 30  22 - 32 mmol/L Final  . Glucose, Bld 01/17/2019  158* 70 - 99 mg/dL Final  . BUN 01/17/2019 17  6 - 20 mg/dL Final  . Creatinine, Ser 01/17/2019 1.33* 0.44 - 1.00 mg/dL Final  . Calcium 01/17/2019 9.4  8.9 - 10.3 mg/dL Final  . Total Protein 01/17/2019 8.1  6.5 - 8.1 g/dL Final  . Albumin 01/17/2019 3.0* 3.5 - 5.0 g/dL Final  . AST 01/17/2019 30  15 - 41 U/L Final  . ALT 01/17/2019 24  0 - 44 U/L Final  . Alkaline Phosphatase 01/17/2019 109  38 - 126 U/L Final  . Total Bilirubin 01/17/2019 <0.2* 0.3 - 1.2 mg/dL Final  . GFR calc non Af Amer 01/17/2019 46* >60 mL/min Final  . GFR calc Af Amer 01/17/2019 54* >60 mL/min Final  . Anion gap 01/17/2019 9  5 - 15 Final   Performed at [facility], [place] [address] [place], [place] [address]  . WBC 01/17/2019 6.1  4.0 - 10.5 K/uL Final  . RBC 01/17/2019 3.76* 3.87 - 5.11 MIL/uL Final  . Hemoglobin 01/17/2019 11.1* 12.0 - 15.0 g/dL Final  . HCT 01/17/2019 33.8* 36.0 - 46.0 % Final  . MCV 01/17/2019 89.9  80.0 - 100.0 fL Final  . MCH 01/17/2019 29.5  26.0 - 34.0 pg Final  . MCHC 01/17/2019 32.8  30.0 - 36.0 g/dL Final  . RDW 01/17/2019 17.5* 11.5 - 15.5 % Final  . Platelets  01/17/2019 278  150 - 400 K/uL Final  . nRBC 01/17/2019 0.0  0.0 - 0.2 % Final  . Neutrophils Relative % 01/17/2019 53  % Final  . Neutro Abs 01/17/2019 3.2  1.7 - 7.7 K/uL Final  . Lymphocytes Relative 01/17/2019 34  % Final  . Lymphs Abs 01/17/2019 2.1  0.7 - 4.0 K/uL Final  . Monocytes Relative 01/17/2019 9  % Final  . Monocytes Absolute 01/17/2019 0.6  0.1 - 1.0 K/uL Final  . Eosinophils Relative 01/17/2019 3  % Final  . Eosinophils Absolute 01/17/2019 0.2  0.0 - 0.5 K/uL Final  . Basophils Relative 01/17/2019 1  % Final  . Basophils Absolute 01/17/2019 0.0  0.0 - 0.1 K/uL Final  . Immature Granulocytes 01/17/2019 0  % Final  . Abs Immature Granulocytes 01/17/2019 0.02  0.00 - 0.07 K/uL Final   Performed at [facility], [place] [address] [place], [place] [address]    (this displays the last labs from the last 3 days)  No results found for: TOTALPROTELP, ALBUMINELP, A1GS, A2GS, BETS, BETA2SER, GAMS, MSPIKE, SPEI (this displays SPEP labs)  No results found for: KPAFRELGTCHN, LAMBDASER, KAPLAMBRATIO (kappa/lambda light chains)  No results found for: HGBA, HGBA2QUANT, HGBFQUANT, HGBSQUAN (Hemoglobinopathy evaluation)   No results found for: LDH  Lab Results  Component Value Date   IRON 26 (L) 02/08/2007   TIBC 371 02/08/2007   IRONPCTSAT 7 (L) 02/08/2007   (Iron and TIBC)  No results found for: FERRITIN  Urinalysis    Component Value Date/Time   COLORURINE YELLOW 10/21/2014 2347   APPEARANCEUR CLOUDY (A) 10/21/2014 2347   LABSPEC 1.014 10/21/2014 2347   PHURINE 6.0 10/21/2014 2347   GLUCOSEU NEGATIVE 10/21/2014 2347   GLUCOSEU NEG mg/dL 01/23/2008 1418   HGBUR LARGE (A) 10/21/2014 2347   BILIRUBINUR negative 12/29/2017 [place] 10/21/2014 2347   PROTEINUR 2+ 12/29/2017 1046   PROTEINUR 100 (A) 10/21/2014 2347   UROBILINOGEN 0.2 12/29/2017 1046   UROBILINOGEN 0.2 10/21/2014 2347   NITRITE negative 12/29/2017 1046  NITRITE NEGATIVE 10/21/2014 2347   LEUKOCYTESUR Large (3+) (A) 12/29/2017 1046     STUDIES:   ELIGIBLE FOR AVAILABLE RESEARCH PROTOCOL: UPBEAT  ASSESSMENT: 53 y.o. , [place] woman status post right breast upper inner quadrant biopsy 04/26/2018 for a clinical T2 N0, stage Ib invasive ductal carcinoma, grade 2, with an MIB-1 of 70%.  (1) genetics testing 07/03/2018  (2) neoadjuvant chemotherapy will consist of carboplatin, docetaxel, trastuzumab and Pertuzumab given every 21 days x 6, starting 05/31/2018  (a) Pertuzumab discontinued starting with cycle 2 because of diarrhea problems  (b) Taxotere stopped after 4 cycles due peripheral neuropathy and substituted for last two cycles with Gemcitabine  (3) trastuzumab adjuvantly today (10/23/18 and during radiation), TDM1 x 8 cycles planned adjuvantly due to residual disease.  (a) echo on 05/16/2018 shows an EF of 60-65%.  (b) echo on 08/27/18 shows an EF 55-60%  (c) echo on 12/03/2018 shows an EF of 60-65%  (4) definitive surgery on 10/03/2018: IDC grade 1, 1.1 cm, DCIS, margins close but negative, 5 SLN negative.  [id number]  (5) adjuvant radiation to follow surgery (delayed due to wound healing issues)  (6) antiestrogens to start at the completion of local treatment  (7) hidradenitis/boils: on doxycycline daily  PLAN: Paticia is doing moderately well today.  Her labs remain stable and she will proceed with treatment today with TDM1.  She is tolerating this moderately well.  She is fatigued, and is managing this with resting when needed, but remaining active when she can be.  She is doing this by working.    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  She understands that she is at increased risk for complications due to her comorbidities and recent treatments.  She knows that if we can write anything on her behalf to have work accommodations to decrease her  exposure to others, we are happy to do so.    She will continue on in radiation.  She is tolerating this well.    Tristan will return as scheduled for her radiation.  She will continue to receive TDM1 every 3 weeks, and we will see her with every other treatment.  She knows to call for any questions or concerns prior to her next appointment with [place].     A total of (20) minutes of face-to-face time was spent with this patient with greater than 50% of that time in counseling and care-coordination.   Wilber Bihari, NP 01/17/19

## 2019-01-18 ENCOUNTER — Ambulatory Visit
Admission: RE | Admit: 2019-01-18 | Discharge: 2019-01-18 | Disposition: A | Discharge status: Home / Self Care | Payer: 59 | Source: Ambulatory Visit | Attending: Radiation Oncology | Admitting: Radiation Oncology

## 2019-01-18 ENCOUNTER — Other Ambulatory Visit: Payer: Self-pay

## 2019-01-18 DIAGNOSIS — C50311 Malignant neoplasm of lower-inner quadrant of right female breast: Secondary | ICD-10-CM | POA: Diagnosis not present

## 2019-01-21 ENCOUNTER — Ambulatory Visit
Admission: RE | Admit: 2019-01-21 | Discharge: 2019-01-21 | Disposition: A | Discharge status: Home / Self Care | Payer: 59 | Source: Ambulatory Visit | Attending: Radiation Oncology | Admitting: Radiation Oncology

## 2019-01-21 ENCOUNTER — Other Ambulatory Visit: Payer: Self-pay

## 2019-01-21 DIAGNOSIS — C50311 Malignant neoplasm of lower-inner quadrant of right female breast: Secondary | ICD-10-CM | POA: Diagnosis not present

## 2019-01-22 ENCOUNTER — Other Ambulatory Visit: Payer: Self-pay

## 2019-01-22 ENCOUNTER — Ambulatory Visit
Admission: RE | Admit: 2019-01-22 | Discharge: 2019-01-22 | Disposition: A | Discharge status: Home / Self Care | Payer: 59 | Source: Ambulatory Visit | Attending: Radiation Oncology | Admitting: Radiation Oncology

## 2019-01-22 DIAGNOSIS — C50311 Malignant neoplasm of lower-inner quadrant of right female breast: Secondary | ICD-10-CM | POA: Diagnosis not present

## 2019-01-23 ENCOUNTER — Other Ambulatory Visit: Payer: Self-pay

## 2019-01-23 ENCOUNTER — Ambulatory Visit
Admission: RE | Admit: 2019-01-23 | Discharge: 2019-01-23 | Disposition: A | Discharge status: Home / Self Care | Payer: 59 | Source: Ambulatory Visit | Attending: Radiation Oncology | Admitting: Radiation Oncology

## 2019-01-23 DIAGNOSIS — C50311 Malignant neoplasm of lower-inner quadrant of right female breast: Secondary | ICD-10-CM | POA: Diagnosis not present

## 2019-01-24 ENCOUNTER — Ambulatory Visit
Admission: RE | Admit: 2019-01-24 | Discharge: 2019-01-24 | Disposition: A | Discharge status: Home / Self Care | Payer: 59 | Source: Ambulatory Visit | Attending: Radiation Oncology | Admitting: Radiation Oncology

## 2019-01-24 ENCOUNTER — Other Ambulatory Visit: Payer: Self-pay

## 2019-01-24 DIAGNOSIS — C50311 Malignant neoplasm of lower-inner quadrant of right female breast: Secondary | ICD-10-CM | POA: Diagnosis not present

## 2019-01-25 ENCOUNTER — Ambulatory Visit
Admission: RE | Admit: 2019-01-25 | Discharge: 2019-01-25 | Disposition: A | Discharge status: Home / Self Care | Payer: 59 | Source: Ambulatory Visit | Attending: Radiation Oncology | Admitting: Radiation Oncology

## 2019-01-25 ENCOUNTER — Other Ambulatory Visit: Payer: Self-pay

## 2019-01-25 ENCOUNTER — Other Ambulatory Visit: Payer: Self-pay | Admitting: Radiation Oncology

## 2019-01-25 DIAGNOSIS — G8918 Other acute postprocedural pain: Secondary | ICD-10-CM

## 2019-01-25 DIAGNOSIS — M25511 Pain in right shoulder: Secondary | ICD-10-CM

## 2019-01-25 DIAGNOSIS — C50311 Malignant neoplasm of lower-inner quadrant of right female breast: Secondary | ICD-10-CM | POA: Diagnosis not present

## 2019-01-28 ENCOUNTER — Other Ambulatory Visit: Payer: Self-pay

## 2019-01-28 ENCOUNTER — Ambulatory Visit
Admission: RE | Admit: 2019-01-28 | Discharge: 2019-01-28 | Disposition: A | Discharge status: Home / Self Care | Payer: 59 | Source: Ambulatory Visit | Attending: Radiation Oncology | Admitting: Radiation Oncology

## 2019-01-28 DIAGNOSIS — C50311 Malignant neoplasm of lower-inner quadrant of right female breast: Secondary | ICD-10-CM | POA: Diagnosis not present

## 2019-01-29 ENCOUNTER — Ambulatory Visit: Payer: 59 | Attending: Radiation Oncology | Admitting: Rehabilitation

## 2019-01-29 ENCOUNTER — Encounter: Payer: Self-pay | Admitting: Rehabilitation

## 2019-01-29 ENCOUNTER — Ambulatory Visit
Admission: RE | Admit: 2019-01-29 | Discharge: 2019-01-29 | Disposition: A | Discharge status: Home / Self Care | Payer: 59 | Source: Ambulatory Visit | Attending: Radiation Oncology | Admitting: Radiation Oncology

## 2019-01-29 ENCOUNTER — Other Ambulatory Visit: Payer: Self-pay

## 2019-01-29 DIAGNOSIS — Z170 Estrogen receptor positive status [ER+]: Secondary | ICD-10-CM | POA: Insufficient documentation

## 2019-01-29 DIAGNOSIS — C50311 Malignant neoplasm of lower-inner quadrant of right female breast: Secondary | ICD-10-CM | POA: Diagnosis not present

## 2019-01-29 DIAGNOSIS — M25511 Pain in right shoulder: Secondary | ICD-10-CM

## 2019-01-29 DIAGNOSIS — R208 Other disturbances of skin sensation: Secondary | ICD-10-CM

## 2019-01-29 DIAGNOSIS — Z9889 Other specified postprocedural states: Secondary | ICD-10-CM

## 2019-01-29 NOTE — Therapy (Signed)
[place], [place], [address] Phone: [phone]   Fax:  [phone]  Physical Therapy Evaluation  Patient Details  Name: [name] MRN: [id number] Date of Birth: July 20, 1966 Referring Provider (PT): Dr. Lisbeth Renshaw   Encounter Date: 01/29/2019  PT End of Session - 01/29/19 2120    Visit Number  1    Number of Visits  7    Date for PT Re-Evaluation  03/12/19    Authorization Type  [employer] medicare    PT Start Time  1005    PT Stop Time  1053    PT Time Calculation (min)  48 min    Activity Tolerance  Patient tolerated treatment well    Behavior During Therapy  [facility] for tasks assessed/performed       Past Medical History:  Diagnosis Date  . Allergy   . Anemia    Normocytic  . Anxiety   . Asthma   . Blood dyscrasia   . Bronchitis 2005  . 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 Diabetes mellitus without complication ([place])   . Family history of breast cancer   . Family history of lung cancer   . Family history of prostate cancer   . Gastroenteritis 07/10/07  . GERD 07/24/2006  . GRAVE'S DISEASE 01/01/2008  . History of hidradenitis suppurativa   . History of kidney stones   . History of thrush   . HIV DISEASE 07/24/2006   dx March 05  . Hyperlipidemia   . HYPERTENSION 07/24/2006  . Hyperthyroidism 08/2006   Grave's Disease -diffuse radiotracer uptake 08/25/06 Thyroid scan-Cold nodule to R lower lobe of thyrorid  . Menometrorrhagia    hx of  . Nephrolithiasis   . 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 Pneumonia 2005  . 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 Sarcoidosis 02/08/2007   dx as a teenager in [place] from abnl CXR. Completed 2 yrs Prednisone after lung bx confirmation. No symptoms since then.  . Suppurative hidradenitis   . Thyroid cancer ([place])   . THYROID NODULE, RIGHT 02/08/2007    Past Surgical  History:  Procedure Laterality Date  . BREAST LUMPECTOMY WITH RADIOACTIVE SEED AND SENTINEL LYMPH NODE BIOPSY Right 10/03/2018   Procedure: RIGHT BREAST LUMPECTOMY WITH RADIOACTIVE SEED AND SENTINEL LYMPH NODE MAPPING;  Surgeon: Erroll Luna, MD;  Location: [facility];  Service: General;  Laterality: Right;  . BREAST SURGERY  1997   Breast Reduction   . CYSTOSCOPY W/ URETERAL STENT REMOVAL  11/09/2012   Procedure: CYSTOSCOPY WITH STENT REMOVAL;  Surgeon: Alexis Frock, MD;  Location: [facility];  Service: Urology;  Laterality: Right;  . CYSTOSCOPY WITH RETROGRADE PYELOGRAM, URETEROSCOPY AND STENT PLACEMENT  11/09/2012   Procedure: CYSTOSCOPY WITH RETROGRADE PYELOGRAM, URETEROSCOPY AND STENT PLACEMENT;  Surgeon: Alexis Frock, MD;  Location: [facility];  Service: Urology;  Laterality: Left;  LEFT URETEROSCOPY, STONE MANIPULATION, left STENT exchange   . CYSTOSCOPY WITH STENT PLACEMENT  10/02/2012   Procedure: CYSTOSCOPY WITH STENT PLACEMENT;  Surgeon: Alexis Frock, MD;  Location: [facility];  Service: Urology;  Laterality: Left;  . DILATION AND CURETTAGE OF UTERUS  Feb 2004   s/p for 1st trimester nonviable pregnancy  . EYE SURGERY     sty under eyelid  . INCISE AND DRAIN ABCESS  Nov 03   s/p I &D for righ inframmary fold hidradenitis  . INCISION AND DRAINAGE PERITONSILLAR ABSCESS  Mar 03  . IR CV LINE  INJECTION  06/07/2018  . IR IMAGING GUIDED PORT INSERTION  06/20/2018  . IR REMOVAL TUN ACCESS W/ PORT W/O FL MOD SED  06/20/2018  . IRRIGATION AND DEBRIDEMENT ABSCESS  01/31/2012   Procedure: IRRIGATION AND DEBRIDEMENT ABSCESS;  Surgeon: Shann Medal, MD;  Location: [facility];  Service: General;  Laterality: Right;  right breast and axilla   . NEPHROLITHOTOMY  10/02/2012   Procedure: NEPHROLITHOTOMY PERCUTANEOUS;  Surgeon: Alexis Frock, MD;  Location[facility];  Service: Urology;  Laterality: Right;  First Stage Percutaneous Nephrolithotomy with Surgeon Access, Left Ureteral Stent    . NEPHROLITHOTOMY   10/04/2012   Procedure: NEPHROLITHOTOMY PERCUTANEOUS SECOND LOOK;  Surgeon: Alexis Frock, MD;  [facility];  Service: Urology;  Laterality: Right;     . NEPHROLITHOTOMY  10/08/2012   Procedure: NEPHROLITHOTOMY PERCUTANEOUS;  Surgeon: Alexis Frock, MD;  [facility];  Service: Urology;  Laterality: Right;  THIRD STAGE, nephrostomy tube exchange x 2  . NEPHROLITHOTOMY  10/11/2012   Procedure: NEPHROLITHOTOMY PERCUTANEOUS SECOND LOOK;  Surgeon: Alexis Frock, MD;  Location[facility];  Service: Urology;  Laterality: Right;  RIGHT 4 STAGE PERCUTANOUS NEPHROLITHOTOMY, right URETEROSCOPY WITH HOLMIUM LASER   . PORTACATH PLACEMENT Left 05/17/2018   Procedure: INSERTION PORT-A-CATH;  Surgeon: Coralie Keens, MD;  Location: [facility];  Service: General;  Laterality: Left;  . RADICAL NECK DISSECTION  04/12/2017   limited/notes 04/12/2017  . RADICAL NECK DISSECTION N/A 04/12/2017   Procedure: RADICAL NECK DISSECTION;  Surgeon: Melida Quitter, MD;  Location: [facility];  Service: ENT;  Laterality: N/A;  limited neck dissection 2 hours total  . REDUCTION MAMMAPLASTY Bilateral 1998  . [place]  1994  . THYROIDECTOMY  04/12/2017   completion/notes 04/12/2017  . THYROIDECTOMY N/A 04/12/2017   Procedure: THYROIDECTOMY;  Surgeon: Melida Quitter, MD;  Location: [facility];  Service: ENT;  Laterality: N/A;  Completion Thyroidectomy  . TOTAL THYROIDECTOMY  2010    There were no vitals filed for this visit.   Subjective Assessment - 01/29/19 1006    Subjective  Presents with Rt shoulder pain after surgery in December. Worsening now.  SHoulder into upper trap region.  has 4 more weeks of radiation.      Pertinent History  S/p Right lumpectomy by Dr. Brantley Stage on 10/03/18 with SLNB x 5 nodes all negative. T2 N0, stage Ib invasive ductal carcinoma, grade 2, with an MIB-1 of 70%. Neoadjuvant chemotherapy completed starting 05/31/18, carboplatin, docetaxel, trastuzumab, and pertuzumab.  Radiation currently.   History of DM, graves disease, HIV, HTN, thyroid cancer/nodule    Limitations  Lifting    Patient Stated Goals  decrease shoulder pain    Currently in Pain?  Yes    Pain Score  6     Pain Location  Shoulder    Pain Orientation  Right    Pain Descriptors / Indicators  Aching    Pain Type  Surgical pain;Chronic pain    Pain Radiating Towards  neck    Pain Onset  More than a month ago    Pain Frequency  Constant    Aggravating Factors   movement up to 9-10/10    Pain Relieving Factors  rest         [facility] PT Assessment - 01/29/19 0001      Assessment   Medical Diagnosis  Rt breast cancer    Referring Provider (PT)  Dr. Lisbeth Renshaw    Onset Date/Surgical Date  10/03/18    Hand Dominance  Right  Next MD Visit  tomorrow    Prior Therapy  no      Precautions   Precaution Comments  lymphedema risk Rt, cancer history      Restrictions   Weight Bearing Restrictions  No      Balance Screen   Has the patient fallen in the past 6 months  No    Has the patient had a decrease in activity level because of a fear of falling?   No    Is the patient reluctant to leave their home because of a fear of falling?   No      Home Film/video editor residence    Living Arrangements  Children      Prior Function   Level of Independence  Independent    Vocation  Full time employment    Programmer, multimedia    Leisure  not currently       Cognition   Overall Cognitive Status  Within Functional Limits for tasks assessed      Observation/Other Assessments   Observations  skin and nipple darkening Rt breast, healing incisions with evidence of poor healing previously.  Multiple small "holes" in the Rt axilla which pt reports is a condition where she gets cysts      Sensation   Light Touch  Appears Intact      Coordination   Gross Motor Movements are Fluid and Coordinated  Yes      Posture/Postural Control   Posture/Postural Control  Postural  limitations    Postural Limitations  Rounded Shoulders      ROM / Strength   AROM / PROM / Strength  AROM;PROM      AROM   Overall AROM Comments  all painful    AROM Assessment Site  Shoulder    Right/Left Shoulder  Right;Left    Right Shoulder Flexion  120 Degrees    Right Shoulder ABduction  124 Degrees    Right Shoulder Internal Rotation  85 Degrees    Right Shoulder External Rotation  80 Degrees    Right Shoulder Horizontal ABduction  40 Degrees    Left Shoulder Flexion  156 Degrees    Left Shoulder ABduction  142 Degrees    Left Shoulder Internal Rotation  70 Degrees    Left Shoulder External Rotation  90 Degrees    Left Shoulder Horizontal ABduction  45 Degrees      PROM   PROM Assessment Site  Shoulder    Right/Left Shoulder  Right;Left    Right Shoulder Flexion  150 Degrees   pn   Right Shoulder ABduction  150 Degrees   pn   Right Shoulder External Rotation  80 Degrees      Palpation   Palpation comment  +1 ttp Rt UT        LYMPHEDEMA/ONCOLOGY QUESTIONNAIRE - 01/29/19 1035      Type   Cancer Type  Rt breast      Surgeries   Lumpectomy Date  10/03/18    Sentinel Lymph Node Biopsy Date  10/03/18    Number Lymph Nodes Removed  5   neg     Treatment   Active Chemotherapy Treatment  Yes    Past Chemotherapy Treatment  Yes    Active Radiation Treatment  Yes      What other symptoms do you have   Are you Having Heaviness or Tightness  Yes    Are you having Pain  Yes      Lymphedema Assessments   Lymphedema Assessments  Upper extremities      Right Upper Extremity Lymphedema   15 cm Proximal to Olecranon Process  39.6 cm    10 cm Proximal to Olecranon Process  37.4 cm    Olecranon Process  27.8 cm    15 cm Proximal to Ulnar Styloid Process  27.6 cm    10 cm Proximal to Ulnar Styloid Process  23 cm    Just Proximal to Ulnar Styloid Process  17.8 cm    Across Hand at [employer]  22.4 cm    At [place] of 2nd Digit  7 cm    Other  9.7      Left  Upper Extremity Lymphedema   15 cm Proximal to Olecranon Process  40.5 cm    10 cm Proximal to Olecranon Process  37.5 cm    Olecranon Process  29 cm    15 cm Proximal to Ulnar Styloid Process  26.5 cm    10 cm Proximal to Ulnar Styloid Process  22.9 cm    Just Proximal to Ulnar Styloid Process  17.8 cm    Across Hand at [employer]  22.1 cm    At [place] of 2nd Digit  7 cm    Other  9.5          Quick Dash - 01/29/19 0001    Open a tight or new jar  Moderate difficulty    Do heavy household chores (wash walls, wash floors)  Severe difficulty    Carry a shopping bag or briefcase  Severe difficulty    Wash your back  Moderate difficulty    Use a knife to cut food  Moderate difficulty    Recreational activities in which you take some force or impact through your arm, shoulder, or hand (golf, hammering, tennis)  Severe difficulty    During the past week, to what extent has your arm, shoulder or hand problem interfered with your normal social activities with family, friends, neighbors, or groups?  Quite a bit    During the past week, to what extent has your arm, shoulder or hand problem limited your work or other regular daily activities  Quite a bit    Arm, shoulder, or hand pain.  Severe    Tingling (pins and needles) in your arm, shoulder, or hand  Moderate    Difficulty Sleeping  Moderate difficulty    DASH Score  63.64 %        Objective measurements completed on examination: See above findings.      [facility] Adult PT Treatment/Exercise - 01/29/19 0001      Self-Care   Self-Care  Other Self-Care Comments    Other Self-Care Comments   lymphedema education with risk reduction techniques      Exercises   Exercises  Shoulder;Other Exercises    Other Exercises   given HEP per instruction section with performance of each x 3; supine dowel flexion, standing dowel flexion and abduction and extension, doorway stretch, upper trap stretch             PT Education - 01/29/19  2119    Education Details  HEP, lymphedema and risk reduction    Person(s) Educated  Patient    Methods  Explanation;Demonstration;Tactile cues;Verbal cues;Handout    Comprehension  Verbalized understanding;Returned demonstration;Verbal cues required;Tactile cues required          PT Long Term Goals -  01/29/19 2129      PT LONG TERM GOAL #1   Title  Pt will be educated on lymphedema signs and symptoms and risk reduction strategies    Time  6    Period  Weeks    Status  New      PT LONG TERM GOAL #2   Title  Pt will decrease QDASH from 63% to 43%    Time  6    Period  Weeks    Status  New      PT LONG TERM GOAL #3   Title  Pt will improve Rt shoulder AROM to flex: 150 and abd: 140    Baseline  flex: 120  abd: 124    Time  6    Period  Weeks    Status  New      PT LONG TERM GOAL #4   Title  Pt will be educated on self management of shoulder pain with appropriate HEP    Time  6    Period  Weeks    Status  New             Plan - 01/29/19 2121    Clinical Impression Statement  Pt presents today with Rt shoulder pain and tension as well as neck tension after lumpectomy and SLNB surgery.  Pt is also going through radiation and has about 4 weeks left.  Due to COVID-19 clinic status eval session was focused on teaching pt AAROM HEP as well as cervical stretches to perform independently.  Pt was educated on signs and symptoms of lymphedema, risk reduction strategies, as well as skin and postural considerations while completing radiation.  PT will check in with pt in 1-2 weeks to see if any additional needs need to be addressed via telehealth or email.  Pt agreeable     Personal Factors and Comorbidities  Comorbidity 2    Comorbidities  current radiation and chemo    Examination-Activity Limitations  Lift;Reach Overhead    Examination-Participation Restrictions  Community Activity    Stability/Clinical Decision Making  Evolving/Moderate complexity    Clinical Decision Making   Moderate    Rehab Potential  Excellent    PT Frequency  1x / week    PT Duration  6 weeks    PT Treatment/Interventions  ADLs/Self Care Home Management;Therapeutic exercise;Neuromuscular re-education;Patient/family education;Manual techniques;Manual lymph drainage;Passive range of motion;Taping    PT Next Visit Plan  recheck with telephone visit, progress shoulder exercises PRN    PT Home Exercise Plan  currently has supine and standing dowel, doorway stretch, and UT stretch    Consulted and Agree with Plan of Care  Patient       Patient will benefit from skilled therapeutic intervention in order to improve the following deficits and impairments:  Decreased knowledge of precautions, Decreased skin integrity, Decreased range of motion, Postural dysfunction, Pain  Visit Diagnosis: Malignant neoplasm of lower-inner quadrant of right breast of female, estrogen receptor positive ([facility])  S/P lumpectomy, right breast  Other disturbances of skin sensation  Right shoulder pain, unspecified chronicity     Problem List Patient Active Problem List   Diagnosis Date Noted  . Preventative health care 09/10/2018  . Genetic testing 07/25/2018  . Port-A-Cath in place 07/12/2018  . Family history of breast cancer   . Family history of prostate cancer   . Family history of lung cancer   . Malignant neoplasm of lower-inner quadrant of right breast of female, estrogen receptor positive (  [place]) 05/10/2018  . DOE (dyspnea on exertion) 10/13/2017  . Hyperlipidemia 04/26/2017  . Chronic anxiety 04/11/2017  . Upper airway cough syndrome 11/11/2016  . Morbid (severe) obesity due to excess calories ([place]) 10/14/2016  . Shortness of breath 10/13/2016  . Allergic rhinitis 02/18/2016  . Right shoulder pain 02/18/2016  . Type 2 diabetes mellitus ([place]) 06/23/2015  . Renal calculi 10/29/2014  . Recurrent boils 06/11/2014  . Abscess of breast, right 01/09/2013  . Hidradenitis suppurativa of right >> left  axilla 01/30/2012  . 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 SARCOIDOSIS 02/08/2007  . Cigarette smoker 02/08/2007  . Human immunodeficiency virus (HIV) disease ([place]) 07/24/2006  . Depression 07/24/2006  . Essential hypertension 07/24/2006  . Asthma 07/24/2006  . GERD 07/24/2006    Shan Levans, PT 01/29/2019, 9:34 PM  [place], [place], [address] Phone: [phone]   Fax:  [phone]  Name: Tahirih Lair MRN: [id number] Date of Birth: [birth year]

## 2019-01-29 NOTE — Patient Instructions (Signed)
SHOULDER: Flexion - Supine (Cane)        Cancer Rehab [phone]    Hold cane in both hands. Raise arms up overhead. Do not allow back to arch. Hold _5__ seconds. Do __5-10__ times; __1-2__ times a day.   Flexion (Eccentric) - Active-Assist (Cane)          Cancer Rehab [phone]    Use unaffected arm to push affected arm forward. Avoid hiking shoulder (shoulder should NOT touch cheek). Keep palm relaxed. Slowly lower affected arm. Hold stretch for _5_ seconds repeating _5-10_ times, _1-2_ times a day.  Abduction (Eccentric) - Active-Assist (Cane)    Use unaffected arm to push affected arm out to side. Avoid hiking shoulder (shoulder should NOT touch cheek). Keep palm relaxed. Slowly lower affected arm. Hold stretch _5_ seconds repeating _5-10_ times, _1-2_ times a day.  Cane Exercise: Extension   Stand holding cane behind back with both hands palm-up. Lift the cane away from body until gentle stretch felt. Do NOT lean forward.  Hold __5__ seconds. Repeat _5-10___ times. Do _1-2___ sessions per day.  CHEST: Doorway, Bilateral - Standing    Standing in doorway, place hands on wall with elbows bent at shoulder height and place one foot in front of other. Shift weight onto front foot. Hold _10-20__ seconds. Do _3-5_ times, _1-2_ times a day.     Flexibility: Upper Trapezius Stretch   Gently grasp right side of head while reaching behind back with other hand. Tilt head away until a gentle stretch is felt. Hold _30___ seconds. Repeat _2_ times per set. Do __1__ sets per session. Do __2_ sessions per day.  [email or web address]

## 2019-01-30 ENCOUNTER — Ambulatory Visit
Admission: RE | Admit: 2019-01-30 | Discharge: 2019-01-30 | Disposition: A | Discharge status: Home / Self Care | Payer: 59 | Source: Ambulatory Visit | Attending: Radiation Oncology | Admitting: Radiation Oncology

## 2019-01-30 ENCOUNTER — Other Ambulatory Visit: Payer: Self-pay

## 2019-01-30 DIAGNOSIS — C50311 Malignant neoplasm of lower-inner quadrant of right female breast: Secondary | ICD-10-CM | POA: Diagnosis not present

## 2019-01-31 ENCOUNTER — Other Ambulatory Visit: Payer: Self-pay

## 2019-01-31 ENCOUNTER — Ambulatory Visit
Admission: RE | Admit: 2019-01-31 | Discharge: 2019-01-31 | Disposition: A | Discharge status: Home / Self Care | Payer: 59 | Source: Ambulatory Visit | Attending: Radiation Oncology | Admitting: Radiation Oncology

## 2019-01-31 DIAGNOSIS — C50311 Malignant neoplasm of lower-inner quadrant of right female breast: Secondary | ICD-10-CM | POA: Diagnosis not present

## 2019-02-01 ENCOUNTER — Ambulatory Visit
Admission: RE | Admit: 2019-02-01 | Discharge: 2019-02-01 | Disposition: A | Discharge status: Home / Self Care | Payer: 59 | Source: Ambulatory Visit | Attending: Radiation Oncology | Admitting: Radiation Oncology

## 2019-02-01 ENCOUNTER — Other Ambulatory Visit: Payer: Self-pay

## 2019-02-01 DIAGNOSIS — C50311 Malignant neoplasm of lower-inner quadrant of right female breast: Secondary | ICD-10-CM | POA: Diagnosis not present

## 2019-02-04 ENCOUNTER — Other Ambulatory Visit: Payer: Self-pay

## 2019-02-04 ENCOUNTER — Ambulatory Visit
Admission: RE | Admit: 2019-02-04 | Discharge: 2019-02-04 | Disposition: A | Discharge status: Home / Self Care | Payer: 59 | Source: Ambulatory Visit | Attending: Radiation Oncology | Admitting: Radiation Oncology

## 2019-02-04 DIAGNOSIS — C50311 Malignant neoplasm of lower-inner quadrant of right female breast: Secondary | ICD-10-CM | POA: Diagnosis not present

## 2019-02-05 ENCOUNTER — Other Ambulatory Visit: Payer: Self-pay

## 2019-02-05 ENCOUNTER — Ambulatory Visit
Admission: RE | Admit: 2019-02-05 | Discharge: 2019-02-05 | Disposition: A | Discharge status: Home / Self Care | Payer: 59 | Source: Ambulatory Visit | Attending: Radiation Oncology | Admitting: Radiation Oncology

## 2019-02-05 DIAGNOSIS — C50311 Malignant neoplasm of lower-inner quadrant of right female breast: Secondary | ICD-10-CM | POA: Diagnosis not present

## 2019-02-06 ENCOUNTER — Ambulatory Visit: Payer: 59

## 2019-02-07 ENCOUNTER — Ambulatory Visit: Payer: 59 | Admitting: Radiation Oncology

## 2019-02-07 ENCOUNTER — Inpatient Hospital Stay: Payer: 59

## 2019-02-07 ENCOUNTER — Ambulatory Visit
Admission: RE | Admit: 2019-02-07 | Discharge: 2019-02-07 | Disposition: A | Discharge status: Home / Self Care | Payer: 59 | Source: Ambulatory Visit | Attending: Radiation Oncology | Admitting: Radiation Oncology

## 2019-02-07 ENCOUNTER — Other Ambulatory Visit: Payer: Self-pay

## 2019-02-07 VITALS — BP 127/76 | Temp 98.3°F | Resp 18 | Wt 232.0 lb

## 2019-02-07 DIAGNOSIS — C73 Malignant neoplasm of thyroid gland: Secondary | ICD-10-CM

## 2019-02-07 DIAGNOSIS — Z95828 Presence of other vascular implants and grafts: Secondary | ICD-10-CM

## 2019-02-07 DIAGNOSIS — Z170 Estrogen receptor positive status [ER+]: Principal | ICD-10-CM

## 2019-02-07 DIAGNOSIS — C50311 Malignant neoplasm of lower-inner quadrant of right female breast: Secondary | ICD-10-CM | POA: Diagnosis not present

## 2019-02-07 DIAGNOSIS — C50211 Malignant neoplasm of upper-inner quadrant of right female breast: Secondary | ICD-10-CM

## 2019-02-07 LAB — CBC WITH DIFFERENTIAL/PLATELET
Abs Immature Granulocytes: 0.03 10*3/uL (ref 0.00–0.07)
Basophils Absolute: 0 10*3/uL (ref 0.0–0.1)
Basophils Relative: 1 %
Eosinophils Absolute: 0.2 10*3/uL (ref 0.0–0.5)
Eosinophils Relative: 2 %
HCT: 35.4 % — ABNORMAL LOW (ref 36.0–46.0)
Hemoglobin: 11.5 g/dL — ABNORMAL LOW (ref 12.0–15.0)
Immature Granulocytes: 1 %
Lymphocytes Relative: 23 %
Lymphs Abs: 1.5 10*3/uL (ref 0.7–4.0)
MCH: 29 pg (ref 26.0–34.0)
MCHC: 32.5 g/dL (ref 30.0–36.0)
MCV: 89.2 fL (ref 80.0–100.0)
Monocytes Absolute: 0.5 10*3/uL (ref 0.1–1.0)
Monocytes Relative: 8 %
Neutro Abs: 4.2 10*3/uL (ref 1.7–7.7)
Neutrophils Relative %: 65 %
Platelets: 282 10*3/uL (ref 150–400)
RBC: 3.97 MIL/uL (ref 3.87–5.11)
RDW: 17.8 % — ABNORMAL HIGH (ref 11.5–15.5)
WBC: 6.4 10*3/uL (ref 4.0–10.5)
nRBC: 0 % (ref 0.0–0.2)

## 2019-02-07 LAB — COMPREHENSIVE METABOLIC PANEL
ALT: 17 U/L (ref 0–44)
AST: 29 U/L (ref 15–41)
Albumin: 2.9 g/dL — ABNORMAL LOW (ref 3.5–5.0)
Alkaline Phosphatase: 104 U/L (ref 38–126)
Anion gap: 11 (ref 5–15)
BUN: 17 mg/dL (ref 6–20)
CO2: 28 mmol/L (ref 22–32)
Calcium: 8.8 mg/dL — ABNORMAL LOW (ref 8.9–10.3)
Chloride: 103 mmol/L (ref 98–111)
Creatinine, Ser: 1.28 mg/dL — ABNORMAL HIGH (ref 0.44–1.00)
GFR calc Af Amer: 56 mL/min — ABNORMAL LOW (ref >60)
GFR calc non Af Amer: 49 mL/min — ABNORMAL LOW (ref >60)
Glucose, Bld: 147 mg/dL — ABNORMAL HIGH (ref 70–99)
Potassium: 3.3 mmol/L — ABNORMAL LOW (ref 3.5–5.1)
Sodium: 142 mmol/L (ref 135–145)
Total Bilirubin: <0.2 mg/dL — ABNORMAL LOW (ref 0.3–1.2)
Total Protein: 8.1 g/dL (ref 6.5–8.1)

## 2019-02-07 MED ORDER — SODIUM CHLORIDE 0.9 % IV SOLN
360.0000 mg | Freq: Once | INTRAVENOUS | Status: AC
  Administered 2019-02-07: 360 mg via INTRAVENOUS
  Filled 2019-02-07: qty 8

## 2019-02-07 MED ORDER — ACETAMINOPHEN 325 MG PO TABS
ORAL_TABLET | ORAL | Status: AC
  Filled 2019-02-07: qty 2

## 2019-02-07 MED ORDER — SODIUM CHLORIDE 0.9% FLUSH
10.0000 mL | INTRAVENOUS | Status: DC | PRN
  Administered 2019-02-07: 13:00:00 10 mL
  Filled 2019-02-07: qty 10

## 2019-02-07 MED ORDER — DIPHENHYDRAMINE HCL 25 MG PO CAPS
50.0000 mg | ORAL_CAPSULE | Freq: Once | ORAL | Status: AC
  Administered 2019-02-07: 50 mg via ORAL

## 2019-02-07 MED ORDER — HEPARIN SOD (PORK) LOCK FLUSH 100 UNIT/ML IV SOLN
500.0000 [IU] | Freq: Once | INTRAVENOUS | Status: AC | PRN
  Administered 2019-02-07: 500 [IU]
  Filled 2019-02-07: qty 5

## 2019-02-07 MED ORDER — ACETAMINOPHEN 325 MG PO TABS
650.0000 mg | ORAL_TABLET | Freq: Once | ORAL | Status: AC
  Administered 2019-02-07: 650 mg via ORAL

## 2019-02-07 MED ORDER — SODIUM CHLORIDE 0.9 % IV SOLN
Freq: Once | INTRAVENOUS | Status: AC
  Administered 2019-02-07: 14:00:00 via INTRAVENOUS
  Filled 2019-02-07: qty 250

## 2019-02-07 MED ORDER — DIPHENHYDRAMINE HCL 25 MG PO CAPS
ORAL_CAPSULE | ORAL | Status: AC
  Filled 2019-02-07: qty 2

## 2019-02-07 MED ORDER — SODIUM CHLORIDE 0.9% FLUSH
10.0000 mL | INTRAVENOUS | Status: DC | PRN
  Administered 2019-02-07: 10 mL
  Filled 2019-02-07: qty 10

## 2019-02-07 NOTE — Progress Notes (Signed)
Declined to stay for 30 min post observation. She feels well.

## 2019-02-07 NOTE — Patient Instructions (Signed)
[facility] Discharge Instructions for Patients Receiving Chemotherapy  Today you received the following chemotherapy agents [place]  To help prevent nausea and vomiting after your treatment, we encourage you to take your nausea medication as directed   If you develop nausea and vomiting that is not controlled by your nausea medication, call the clinic.   BELOW ARE SYMPTOMS THAT SHOULD BE REPORTED IMMEDIATELY:  *FEVER GREATER THAN 100.5 F  *CHILLS WITH OR WITHOUT FEVER  NAUSEA AND VOMITING THAT IS NOT CONTROLLED WITH YOUR NAUSEA MEDICATION  *UNUSUAL SHORTNESS OF BREATH  *UNUSUAL BRUISING OR BLEEDING  TENDERNESS IN MOUTH AND THROAT WITH OR WITHOUT PRESENCE OF ULCERS  *URINARY PROBLEMS  *BOWEL PROBLEMS  UNUSUAL RASH Items with * indicate a potential emergency and should be followed up as soon as possible.  Feel free to call the clinic should you have any questions or concerns. The clinic phone number is [phone].  Please show the CHEMO ALERT CARD at check-in to the Emergency Department and triage nurse.   

## 2019-02-08 ENCOUNTER — Ambulatory Visit
Admission: RE | Admit: 2019-02-08 | Discharge: 2019-02-08 | Disposition: A | Discharge status: Home / Self Care | Payer: 59 | Source: Ambulatory Visit | Attending: Radiation Oncology | Admitting: Radiation Oncology

## 2019-02-08 ENCOUNTER — Other Ambulatory Visit: Payer: Self-pay

## 2019-02-08 DIAGNOSIS — C50311 Malignant neoplasm of lower-inner quadrant of right female breast: Secondary | ICD-10-CM | POA: Diagnosis not present

## 2019-02-11 ENCOUNTER — Ambulatory Visit
Admission: RE | Admit: 2019-02-11 | Discharge: 2019-02-11 | Disposition: A | Discharge status: Home / Self Care | Payer: 59 | Source: Ambulatory Visit | Attending: Radiation Oncology | Admitting: Radiation Oncology

## 2019-02-11 ENCOUNTER — Other Ambulatory Visit: Payer: Self-pay

## 2019-02-11 DIAGNOSIS — C50311 Malignant neoplasm of lower-inner quadrant of right female breast: Secondary | ICD-10-CM | POA: Diagnosis not present

## 2019-02-12 ENCOUNTER — Ambulatory Visit
Admission: RE | Admit: 2019-02-12 | Discharge: 2019-02-12 | Disposition: A | Discharge status: Home / Self Care | Payer: 59 | Source: Ambulatory Visit | Attending: Radiation Oncology | Admitting: Radiation Oncology

## 2019-02-12 ENCOUNTER — Other Ambulatory Visit: Payer: Self-pay

## 2019-02-12 DIAGNOSIS — C50311 Malignant neoplasm of lower-inner quadrant of right female breast: Secondary | ICD-10-CM | POA: Diagnosis not present

## 2019-02-13 ENCOUNTER — Ambulatory Visit
Admission: RE | Admit: 2019-02-13 | Discharge: 2019-02-13 | Disposition: A | Discharge status: Home / Self Care | Payer: 59 | Source: Ambulatory Visit | Attending: Radiation Oncology | Admitting: Radiation Oncology

## 2019-02-13 ENCOUNTER — Other Ambulatory Visit: Payer: Self-pay

## 2019-02-13 DIAGNOSIS — C50311 Malignant neoplasm of lower-inner quadrant of right female breast: Secondary | ICD-10-CM | POA: Diagnosis not present

## 2019-02-14 ENCOUNTER — Other Ambulatory Visit: Payer: Self-pay

## 2019-02-14 ENCOUNTER — Ambulatory Visit: Payer: 59

## 2019-02-14 ENCOUNTER — Ambulatory Visit
Admission: RE | Admit: 2019-02-14 | Discharge: 2019-02-14 | Disposition: A | Discharge status: Home / Self Care | Payer: 59 | Source: Ambulatory Visit | Attending: Radiation Oncology | Admitting: Radiation Oncology

## 2019-02-14 DIAGNOSIS — C50311 Malignant neoplasm of lower-inner quadrant of right female breast: Secondary | ICD-10-CM | POA: Diagnosis not present

## 2019-02-14 NOTE — Progress Notes (Signed)
  Radiation Oncology         [phone] ________________________________  Name: [name] MRN: [id number]  Date: 12/28/2018  DOB: 1966/09/09  DIAGNOSIS:     ICD-10-CM   1. Malignant neoplasm of lower-inner quadrant of right breast of female, estrogen receptor positive ([place]) C50.311    Z17.0      SIMULATION AND TREATMENT PLANNING NOTE  The patient presented for simulation prior to beginning her course of radiation treatment for her diagnosis of right-sided breast cancer. The patient was placed in a supine position on a breast board. A customized vac-lock bag was constructed and this complex treatment device will be used on a daily basis during her treatment. In this fashion, a CT scan was obtained through the chest area and an isocenter was placed near the chest wall within the breast.  The patient will be planned to receive a course of radiation initially to a dose of 50.4 Gy. This will consist of a whole breast radiotherapy technique. To accomplish this, 2 customized blocks have been designed which will correspond to medial and lateral whole breast tangent fields. This treatment will be accomplished at 1.8 Gy per fraction. A forward planning technique will also be evaluated to determine if this approach improves the plan. It is anticipated that the patient will then receive a 10 Gy boost to the seroma cavity which has been contoured. This will be accomplished at 2 Gy per fraction.   This initial treatment will consist of a 3-D conformal technique. The seroma has been contoured as the primary target structure. Additionally, dose volume histograms of both this target as well as the lungs and heart will also be evaluated. Such an approach is necessary to ensure that the target area is adequately covered while the nearby critical  normal structures are adequately spared.  Plan:  The final anticipated total dose therefore will correspond to 60.4 Gy.     _______________________________   Jodelle Gross, MD, PhD

## 2019-02-14 NOTE — Progress Notes (Signed)
  Radiation Oncology         [phone] ________________________________  Name: [name] MRN: [id number]  Date: 12/28/2018  DOB: January 08, 1966  Optical Surface Tracking Plan:  Since intensity modulated radiotherapy (IMRT) and 3D conformal radiation treatment methods are predicated on accurate and precise positioning for treatment, intrafraction motion monitoring is medically necessary to ensure accurate and safe treatment delivery.  The ability to quantify intrafraction motion without excessive ionizing radiation dose can only be performed with optical surface tracking. Accordingly, surface imaging offers the opportunity to obtain 3D measurements of patient position throughout IMRT and 3D treatments without excessive radiation exposure.  I am ordering optical surface tracking for this patient's upcoming course of radiotherapy. ________________________________  Kyung Rudd, MD 02/14/2019 12:13 PM    Reference:   Ursula Alert, J, et al. Surface imaging-based analysis of intrafraction motion for breast radiotherapy patients.Journal of [place], n. 6, nov. 2014. ISSN [id number].   Available at: <http://www.jacmp.org/index.php/jacmp/article/view/4957>.

## 2019-02-15 ENCOUNTER — Ambulatory Visit: Payer: 59

## 2019-02-15 ENCOUNTER — Ambulatory Visit
Admission: RE | Admit: 2019-02-15 | Discharge: 2019-02-15 | Disposition: A | Discharge status: Home / Self Care | Payer: 59 | Source: Ambulatory Visit | Attending: Radiation Oncology | Admitting: Radiation Oncology

## 2019-02-15 ENCOUNTER — Other Ambulatory Visit: Payer: Self-pay

## 2019-02-15 DIAGNOSIS — M25511 Pain in right shoulder: Secondary | ICD-10-CM | POA: Diagnosis present

## 2019-02-15 DIAGNOSIS — Z9889 Other specified postprocedural states: Secondary | ICD-10-CM | POA: Diagnosis present

## 2019-02-15 DIAGNOSIS — C50311 Malignant neoplasm of lower-inner quadrant of right female breast: Secondary | ICD-10-CM | POA: Insufficient documentation

## 2019-02-15 DIAGNOSIS — R208 Other disturbances of skin sensation: Secondary | ICD-10-CM | POA: Diagnosis present

## 2019-02-15 DIAGNOSIS — Z170 Estrogen receptor positive status [ER+]: Secondary | ICD-10-CM | POA: Insufficient documentation

## 2019-02-15 MED ORDER — RADIAPLEXRX EX GEL
Freq: Once | CUTANEOUS | Status: AC
  Administered 2019-02-15: 11:00:00 via TOPICAL

## 2019-02-18 ENCOUNTER — Ambulatory Visit
Admission: RE | Admit: 2019-02-18 | Discharge: 2019-02-18 | Disposition: A | Discharge status: Home / Self Care | Payer: 59 | Source: Ambulatory Visit | Attending: Radiation Oncology | Admitting: Radiation Oncology

## 2019-02-18 ENCOUNTER — Other Ambulatory Visit: Payer: Self-pay

## 2019-02-18 DIAGNOSIS — C50311 Malignant neoplasm of lower-inner quadrant of right female breast: Secondary | ICD-10-CM | POA: Diagnosis not present

## 2019-02-19 ENCOUNTER — Encounter: Payer: Self-pay | Admitting: Rehabilitation

## 2019-02-19 ENCOUNTER — Other Ambulatory Visit: Payer: Self-pay

## 2019-02-19 ENCOUNTER — Ambulatory Visit: Payer: 59 | Attending: Radiation Oncology | Admitting: Rehabilitation

## 2019-02-19 ENCOUNTER — Ambulatory Visit
Admission: RE | Admit: 2019-02-19 | Discharge: 2019-02-19 | Disposition: A | Discharge status: Home / Self Care | Payer: 59 | Source: Ambulatory Visit | Attending: Radiation Oncology | Admitting: Radiation Oncology

## 2019-02-19 DIAGNOSIS — C50311 Malignant neoplasm of lower-inner quadrant of right female breast: Secondary | ICD-10-CM | POA: Diagnosis not present

## 2019-02-19 DIAGNOSIS — R208 Other disturbances of skin sensation: Secondary | ICD-10-CM

## 2019-02-19 DIAGNOSIS — M25511 Pain in right shoulder: Secondary | ICD-10-CM

## 2019-02-19 DIAGNOSIS — Z170 Estrogen receptor positive status [ER+]: Secondary | ICD-10-CM | POA: Insufficient documentation

## 2019-02-19 DIAGNOSIS — Z9889 Other specified postprocedural states: Secondary | ICD-10-CM

## 2019-02-19 NOTE — Patient Instructions (Signed)
Added row to HEP with yellow band x 12 1-3sets

## 2019-02-19 NOTE — Therapy (Signed)
[place], [place], [address] Phone: [phone]   Fax:  [phone]  Physical Therapy Treatment  Patient Details  Name: [name] MRN: [id number] Date of Birth: May 21, 1966 Referring Provider (PT): Dr. Lisbeth Renshaw   Encounter Date: 02/19/2019  PT End of Session - 02/19/19 1050    Visit Number  2    Number of Visits  7    Date for PT Re-Evaluation  03/12/19    Authorization Type  [employer] medicare    PT Start Time  1002    PT Stop Time  1045    PT Time Calculation (min)  43 min    Activity Tolerance  Patient tolerated treatment well    Behavior During Therapy  [facility] for tasks assessed/performed       Past Medical History:  Diagnosis Date  . Allergy   . Anemia    Normocytic  . Anxiety   . Asthma   . Blood dyscrasia   . Bronchitis 2005  . 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 Diabetes mellitus without complication ([place])   . Family history of breast cancer   . Family history of lung cancer   . Family history of prostate cancer   . Gastroenteritis 07/10/07  . GERD 07/24/2006  . GRAVE'S DISEASE 01/01/2008  . History of hidradenitis suppurativa   . History of kidney stones   . History of thrush   . HIV DISEASE 07/24/2006   dx March 05  . Hyperlipidemia   . HYPERTENSION 07/24/2006  . Hyperthyroidism 08/2006   Grave's Disease -diffuse radiotracer uptake 08/25/06 Thyroid scan-Cold nodule to R lower lobe of thyrorid  . Menometrorrhagia    hx of  . Nephrolithiasis   . 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 Pneumonia 2005  . 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 Sarcoidosis 02/08/2007   dx as a teenager in [place] from abnl CXR. Completed 2 yrs Prednisone after lung bx confirmation. No symptoms since then.  . Suppurative hidradenitis   . Thyroid cancer ([place])   . THYROID NODULE, RIGHT 02/08/2007    Past Surgical History:   Procedure Laterality Date  . BREAST LUMPECTOMY WITH RADIOACTIVE SEED AND SENTINEL LYMPH NODE BIOPSY Right 10/03/2018   Procedure: RIGHT BREAST LUMPECTOMY WITH RADIOACTIVE SEED AND SENTINEL LYMPH NODE MAPPING;  Surgeon: Erroll Luna, MD;  Location: [facility];  Service: General;  Laterality: Right;  . BREAST SURGERY  1997   Breast Reduction   . CYSTOSCOPY W/ URETERAL STENT REMOVAL  11/09/2012   Procedure: CYSTOSCOPY WITH STENT REMOVAL;  Surgeon: Alexis Frock, MD;  Location: [facility];  Service: Urology;  Laterality: Right;  . CYSTOSCOPY WITH RETROGRADE PYELOGRAM, URETEROSCOPY AND STENT PLACEMENT  11/09/2012   Procedure: CYSTOSCOPY WITH RETROGRADE PYELOGRAM, URETEROSCOPY AND STENT PLACEMENT;  Surgeon: Alexis Frock, MD;  Location: [facility];  Service: Urology;  Laterality: Left;  LEFT URETEROSCOPY, STONE MANIPULATION, left STENT exchange   . CYSTOSCOPY WITH STENT PLACEMENT  10/02/2012   Procedure: CYSTOSCOPY WITH STENT PLACEMENT;  Surgeon: Alexis Frock, MD;  Location: [facility];  Service: Urology;  Laterality: Left;  . DILATION AND CURETTAGE OF UTERUS  Feb 2004   s/p for 1st trimester nonviable pregnancy  . EYE SURGERY     sty under eyelid  . INCISE AND DRAIN ABCESS  Nov 03   s/p I &D for righ inframmary fold hidradenitis  . INCISION AND DRAINAGE PERITONSILLAR ABSCESS  Mar 03  . IR CV LINE  INJECTION  06/07/2018  . IR IMAGING GUIDED PORT INSERTION  06/20/2018  . IR REMOVAL TUN ACCESS W/ PORT W/O FL MOD SED  06/20/2018  . IRRIGATION AND DEBRIDEMENT ABSCESS  01/31/2012   Procedure: IRRIGATION AND DEBRIDEMENT ABSCESS;  Surgeon: Shann Medal, MD;  Location: [facility];  Service: General;  Laterality: Right;  right breast and axilla   . NEPHROLITHOTOMY  10/02/2012   Procedure: NEPHROLITHOTOMY PERCUTANEOUS;  Surgeon: Alexis Frock, MD;  Location: [facility];  Service: Urology;  Laterality: Right;  First Stage Percutaneous Nephrolithotomy with Surgeon Access, Left Ureteral Stent    . NEPHROLITHOTOMY  10/04/2012    Procedure: NEPHROLITHOTOMY PERCUTANEOUS SECOND LOOK;  Surgeon: Alexis Frock, MD;  Location[facility];  Service: Urology;  Laterality: Right;     . NEPHROLITHOTOMY  10/08/2012   Procedure: NEPHROLITHOTOMY PERCUTANEOUS;  Surgeon: Alexis Frock, MD;  Location[facility];  Service: Urology;  Laterality: Right;  THIRD STAGE, nephrostomy tube exchange x 2  . NEPHROLITHOTOMY  10/11/2012   Procedure: NEPHROLITHOTOMY PERCUTANEOUS SECOND LOOK;  Surgeon: Alexis Frock, MD;  Location[facility];  Service: Urology;  Laterality: Right;  RIGHT 4 STAGE PERCUTANOUS NEPHROLITHOTOMY, right URETEROSCOPY WITH HOLMIUM LASER   . PORTACATH PLACEMENT Left 05/17/2018   Procedure: INSERTION PORT-A-CATH;  Surgeon: Coralie Keens, MD;  Location: [facility];  Service: General;  Laterality: Left;  . RADICAL NECK DISSECTION  04/12/2017   limited/notes 04/12/2017  . RADICAL NECK DISSECTION N/A 04/12/2017   Procedure: RADICAL NECK DISSECTION;  Surgeon: Melida Quitter, MD;  Location: [facility];  Service: ENT;  Laterality: N/A;  limited neck dissection 2 hours total  . REDUCTION MAMMAPLASTY Bilateral 1998  . [place]  1994  . THYROIDECTOMY  04/12/2017   completion/notes 04/12/2017  . THYROIDECTOMY N/A 04/12/2017   Procedure: THYROIDECTOMY;  Surgeon: Melida Quitter, MD;  Location: [facility];  Service: ENT;  Laterality: N/A;  Completion Thyroidectomy  . TOTAL THYROIDECTOMY  2010    There were no vitals filed for this visit.  Subjective Assessment - 02/19/19 1002    Subjective  2 more radiation sessions left.  No redness or blistering. Shoulder is about the same.  The worst when I wake up.      Pertinent History  S/p Right lumpectomy by Dr. Brantley Stage on 10/03/18 with SLNB x 5 nodes all negative. T2 N0, stage Ib invasive ductal carcinoma, grade 2, with an MIB-1 of 70%. Neoadjuvant chemotherapy completed starting 05/31/18, carboplatin, docetaxel, trastuzumab, and pertuzumab.  Radiation currently.  History of DM, graves disease, HIV, HTN,  thyroid cancer/nodule    Patient Stated Goals  decrease shoulder pain    Currently in Pain?  Yes    Pain Score  7     Pain Location  Shoulder    Pain Orientation  Right    Pain Descriptors / Indicators  Aching    Pain Type  Chronic pain    Pain Onset  More than a month ago    Pain Frequency  Constant         [facility] PT Assessment - 02/19/19 0001      AROM   Right Shoulder Flexion  134 Degrees    Right Shoulder ABduction  139 Degrees                   [facility] Adult PT Treatment/Exercise - 02/19/19 0001      Exercises   Other Exercises   doorway stretch with cueing to keep the forearms down against the frame 3x20" and row  yellow band added to HEP x 12      Manual Therapy   Manual Therapy  Soft tissue mobilization;Passive ROM;Joint mobilization;Manual Lymphatic Drainage (MLD)    Joint Mobilization  GH PA and inferior glides grade IV-3x30" each in 45deg of abduction    Soft tissue mobilization  seated to the Rt UT in neutral and Lt sidebend position    Manual Lymphatic Drainage (MLD)  some stationary circles to the mild edema at the lateral neck/supraclavicular area towards the cervical area/supraclavicular nodes. Education on the difference between MLD and STM for pt's daughter who is helping with some massage at home     Passive ROM  to the Rt shoulder to tolerance                  PT Long Term Goals - 02/19/19 1053      PT LONG TERM GOAL #1   Title  Pt will be educated on lymphedema signs and symptoms and risk reduction strategies    Status  On-going      PT LONG TERM GOAL #2   Title  Pt will decrease QDASH from 63% to 43%    Status  On-going      PT LONG TERM GOAL #3   Title  Pt will improve Rt shoulder AROM to flex: 150 and abd: 140    Status  On-going      PT LONG TERM GOAL #4   Title  Pt will be educated on self management of shoulder pain with appropriate HEP    Status  On-going            Plan - 02/19/19 1050    Clinical Impression  Statement  Pt returns today after COVID break to resume PT appointments.  Pt aware of risks and would like to proceed.  Pt continues with the same level of shoulder pain, but has improved both flexion and abduction ROM with HEP since eval visit.  tenderness present in the Rt upper trap but not significantly tight.  Shoulder ROM improved with joint mobilitzation and edema near the neck decreased with MLD here.     PT Next Visit Plan  Rt neck and shoulder pain; continue STM, MLD, joint mob, and postural exercises    PT Home Exercise Plan  currently has supine and standing dowel, doorway stretch, yellow band row, and UT stretch    Consulted and Agree with Plan of Care  Patient       Patient will benefit from skilled therapeutic intervention in order to improve the following deficits and impairments:     Visit Diagnosis: Malignant neoplasm of lower-inner quadrant of right breast of female, estrogen receptor positive ([place])  S/P lumpectomy, right breast  Other disturbances of skin sensation  Right shoulder pain, unspecified chronicity     Problem List Patient Active Problem List   Diagnosis Date Noted  . Preventative health care 09/10/2018  . Genetic testing 07/25/2018  . Port-A-Cath in place 07/12/2018  . Family history of breast cancer   . Family history of prostate cancer   . Family history of lung cancer   . Malignant neoplasm of lower-inner quadrant of right breast of female, estrogen receptor positive ([place]) 05/10/2018  . DOE (dyspnea on exertion) 10/13/2017  . Hyperlipidemia 04/26/2017  . Chronic anxiety 04/11/2017  . Upper airway cough syndrome 11/11/2016  . Morbid (severe) obesity due to excess calories ([place]) 10/14/2016  . Shortness of breath 10/13/2016  . Allergic rhinitis 02/18/2016  . Right  shoulder pain 02/18/2016  . Type 2 diabetes mellitus ([place]) 06/23/2015  . Renal calculi 10/29/2014  . Recurrent boils 06/11/2014  . Abscess of breast, right 01/09/2013  .  Hidradenitis suppurativa of right >> left axilla 01/30/2012  . 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 SARCOIDOSIS 02/08/2007  . Cigarette smoker 02/08/2007  . Human immunodeficiency virus (HIV) disease ([place]) 07/24/2006  . Depression 07/24/2006  . Essential hypertension 07/24/2006  . Asthma 07/24/2006  . GERD 07/24/2006    Shan Levans, PT 02/19/2019, 10:55 AM  [place], [place], [address] Phone: [phone]   Fax:  [phone]  Name: [name] MRN: [id number] Date of Birth: Feb 13, 1966

## 2019-02-20 ENCOUNTER — Ambulatory Visit: Payer: 59

## 2019-02-20 ENCOUNTER — Other Ambulatory Visit: Payer: Self-pay

## 2019-02-20 ENCOUNTER — Ambulatory Visit
Admission: RE | Admit: 2019-02-20 | Discharge: 2019-02-20 | Disposition: A | Discharge status: Home / Self Care | Payer: 59 | Source: Ambulatory Visit | Attending: Radiation Oncology | Admitting: Radiation Oncology

## 2019-02-20 DIAGNOSIS — C50311 Malignant neoplasm of lower-inner quadrant of right female breast: Secondary | ICD-10-CM | POA: Diagnosis not present

## 2019-02-21 ENCOUNTER — Other Ambulatory Visit: Payer: Self-pay

## 2019-02-21 ENCOUNTER — Encounter: Payer: Self-pay | Admitting: Radiation Oncology

## 2019-02-21 ENCOUNTER — Ambulatory Visit
Admission: RE | Admit: 2019-02-21 | Discharge: 2019-02-21 | Disposition: A | Discharge status: Home / Self Care | Payer: 59 | Source: Ambulatory Visit | Attending: Radiation Oncology | Admitting: Radiation Oncology

## 2019-02-21 ENCOUNTER — Encounter: Payer: Self-pay | Admitting: *Deleted

## 2019-02-21 DIAGNOSIS — C50311 Malignant neoplasm of lower-inner quadrant of right female breast: Secondary | ICD-10-CM | POA: Diagnosis not present

## 2019-02-22 ENCOUNTER — Ambulatory Visit: Payer: Self-pay | Admitting: Primary Care

## 2019-02-28 ENCOUNTER — Other Ambulatory Visit: Payer: Self-pay | Admitting: *Deleted

## 2019-02-28 ENCOUNTER — Inpatient Hospital Stay: Payer: 59

## 2019-02-28 ENCOUNTER — Encounter: Payer: Self-pay | Admitting: Adult Health

## 2019-02-28 ENCOUNTER — Inpatient Hospital Stay (HOSPITAL_BASED_OUTPATIENT_CLINIC_OR_DEPARTMENT_OTHER): Payer: 59 | Admitting: Adult Health

## 2019-02-28 ENCOUNTER — Other Ambulatory Visit: Payer: Self-pay

## 2019-02-28 ENCOUNTER — Inpatient Hospital Stay: Payer: 59 | Attending: Oncology

## 2019-02-28 VITALS — BP 143/79 | HR 80 | Temp 99.1°F | Resp 18 | Ht 65.0 in | Wt 235.4 lb

## 2019-02-28 DIAGNOSIS — Z923 Personal history of irradiation: Secondary | ICD-10-CM | POA: Insufficient documentation

## 2019-02-28 DIAGNOSIS — Z170 Estrogen receptor positive status [ER+]: Secondary | ICD-10-CM

## 2019-02-28 DIAGNOSIS — Z7984 Long term (current) use of oral hypoglycemic drugs: Secondary | ICD-10-CM | POA: Diagnosis not present

## 2019-02-28 DIAGNOSIS — Z800 Family history of malignant neoplasm of digestive organs: Secondary | ICD-10-CM | POA: Insufficient documentation

## 2019-02-28 DIAGNOSIS — Z803 Family history of malignant neoplasm of breast: Secondary | ICD-10-CM

## 2019-02-28 DIAGNOSIS — I10 Essential (primary) hypertension: Secondary | ICD-10-CM | POA: Diagnosis not present

## 2019-02-28 DIAGNOSIS — E119 Type 2 diabetes mellitus without complications: Secondary | ICD-10-CM

## 2019-02-28 DIAGNOSIS — Z801 Family history of malignant neoplasm of trachea, bronchus and lung: Secondary | ICD-10-CM | POA: Insufficient documentation

## 2019-02-28 DIAGNOSIS — C50211 Malignant neoplasm of upper-inner quadrant of right female breast: Secondary | ICD-10-CM

## 2019-02-28 DIAGNOSIS — Z95828 Presence of other vascular implants and grafts: Secondary | ICD-10-CM

## 2019-02-28 DIAGNOSIS — Z5112 Encounter for antineoplastic immunotherapy: Secondary | ICD-10-CM | POA: Diagnosis not present

## 2019-02-28 DIAGNOSIS — Z87891 Personal history of nicotine dependence: Secondary | ICD-10-CM | POA: Diagnosis not present

## 2019-02-28 DIAGNOSIS — E039 Hypothyroidism, unspecified: Secondary | ICD-10-CM | POA: Diagnosis not present

## 2019-02-28 DIAGNOSIS — C50311 Malignant neoplasm of lower-inner quadrant of right female breast: Secondary | ICD-10-CM | POA: Diagnosis not present

## 2019-02-28 DIAGNOSIS — C73 Malignant neoplasm of thyroid gland: Secondary | ICD-10-CM

## 2019-02-28 DIAGNOSIS — Z8042 Family history of malignant neoplasm of prostate: Secondary | ICD-10-CM | POA: Insufficient documentation

## 2019-02-28 DIAGNOSIS — Z79899 Other long term (current) drug therapy: Secondary | ICD-10-CM

## 2019-02-28 LAB — CBC WITH DIFFERENTIAL/PLATELET
Abs Immature Granulocytes: 0.05 10*3/uL (ref 0.00–0.07)
Basophils Absolute: 0 10*3/uL (ref 0.0–0.1)
Basophils Relative: 1 %
Eosinophils Absolute: 0.1 10*3/uL (ref 0.0–0.5)
Eosinophils Relative: 2 %
HCT: 34.7 % — ABNORMAL LOW (ref 36.0–46.0)
Hemoglobin: 11.4 g/dL — ABNORMAL LOW (ref 12.0–15.0)
Immature Granulocytes: 1 %
Lymphocytes Relative: 25 %
Lymphs Abs: 1.6 10*3/uL (ref 0.7–4.0)
MCH: 29.5 pg (ref 26.0–34.0)
MCHC: 32.9 g/dL (ref 30.0–36.0)
MCV: 89.9 fL (ref 80.0–100.0)
Monocytes Absolute: 0.6 10*3/uL (ref 0.1–1.0)
Monocytes Relative: 10 %
Neutro Abs: 4 10*3/uL (ref 1.7–7.7)
Neutrophils Relative %: 61 %
Platelets: 229 10*3/uL (ref 150–400)
RBC: 3.86 MIL/uL — ABNORMAL LOW (ref 3.87–5.11)
RDW: 18.4 % — ABNORMAL HIGH (ref 11.5–15.5)
WBC: 6.5 10*3/uL (ref 4.0–10.5)
nRBC: 0 % (ref 0.0–0.2)

## 2019-02-28 LAB — COMPREHENSIVE METABOLIC PANEL
ALT: 25 U/L (ref 0–44)
AST: 32 U/L (ref 15–41)
Albumin: 2.8 g/dL — ABNORMAL LOW (ref 3.5–5.0)
Alkaline Phosphatase: 103 U/L (ref 38–126)
Anion gap: 7 (ref 5–15)
BUN: 20 mg/dL (ref 6–20)
CO2: 31 mmol/L (ref 22–32)
Calcium: 9.3 mg/dL (ref 8.9–10.3)
Chloride: 102 mmol/L (ref 98–111)
Creatinine, Ser: 1.42 mg/dL — ABNORMAL HIGH (ref 0.44–1.00)
GFR calc Af Amer: 50 mL/min — ABNORMAL LOW (ref >60)
GFR calc non Af Amer: 43 mL/min — ABNORMAL LOW (ref >60)
Glucose, Bld: 122 mg/dL — ABNORMAL HIGH (ref 70–99)
Potassium: 3.5 mmol/L (ref 3.5–5.1)
Sodium: 140 mmol/L (ref 135–145)
Total Bilirubin: <0.2 mg/dL — ABNORMAL LOW (ref 0.3–1.2)
Total Protein: 8 g/dL (ref 6.5–8.1)

## 2019-02-28 MED ORDER — SODIUM CHLORIDE 0.9 % IV SOLN
360.0000 mg | Freq: Once | INTRAVENOUS | Status: AC
  Administered 2019-02-28: 16:00:00 360 mg via INTRAVENOUS
  Filled 2019-02-28: qty 8

## 2019-02-28 MED ORDER — HEPARIN SOD (PORK) LOCK FLUSH 100 UNIT/ML IV SOLN
500.0000 [IU] | Freq: Once | INTRAVENOUS | Status: AC | PRN
  Administered 2019-02-28: 500 [IU]
  Filled 2019-02-28: qty 5

## 2019-02-28 MED ORDER — SODIUM CHLORIDE 0.9 % IV SOLN
Freq: Once | INTRAVENOUS | Status: AC
  Administered 2019-02-28: 15:00:00 via INTRAVENOUS
  Filled 2019-02-28: qty 250

## 2019-02-28 MED ORDER — ACETAMINOPHEN 325 MG PO TABS
650.0000 mg | ORAL_TABLET | Freq: Once | ORAL | Status: AC
  Administered 2019-02-28: 650 mg via ORAL

## 2019-02-28 MED ORDER — ACETAMINOPHEN 325 MG PO TABS
ORAL_TABLET | ORAL | Status: AC
  Filled 2019-02-28: qty 2

## 2019-02-28 MED ORDER — DIPHENHYDRAMINE HCL 25 MG PO CAPS
50.0000 mg | ORAL_CAPSULE | Freq: Once | ORAL | Status: AC
  Administered 2019-02-28: 15:00:00 50 mg via ORAL

## 2019-02-28 MED ORDER — DIPHENHYDRAMINE HCL 25 MG PO CAPS
ORAL_CAPSULE | ORAL | Status: AC
  Filled 2019-02-28: qty 2

## 2019-02-28 MED ORDER — SODIUM CHLORIDE 0.9% FLUSH
10.0000 mL | INTRAVENOUS | Status: DC | PRN
  Administered 2019-02-28: 17:00:00 10 mL
  Filled 2019-02-28: qty 10

## 2019-02-28 MED ORDER — SODIUM CHLORIDE 0.9% FLUSH
10.0000 mL | INTRAVENOUS | Status: DC | PRN
  Administered 2019-02-28: 13:00:00 10 mL
  Filled 2019-02-28: qty 10

## 2019-02-28 MED ORDER — TAMOXIFEN CITRATE 20 MG PO TABS: 20.0000 mg | ORAL_TABLET | Freq: Every day | ORAL | 5 refills | Status: DC

## 2019-02-28 MED ORDER — GABAPENTIN 300 MG PO CAPS: 300.0000 mg | ORAL_CAPSULE | Freq: Three times a day (TID) | ORAL | 0 refills | Status: DC

## 2019-02-28 NOTE — Patient Instructions (Signed)
[facility] Discharge Instructions for Patients Receiving Chemotherapy  Today you received the following chemotherapy agents [place]  To help prevent nausea and vomiting after your treatment, we encourage you to take your nausea medication as directed   If you develop nausea and vomiting that is not controlled by your nausea medication, call the clinic.   BELOW ARE SYMPTOMS THAT SHOULD BE REPORTED IMMEDIATELY:  *FEVER GREATER THAN 100.5 F  *CHILLS WITH OR WITHOUT FEVER  NAUSEA AND VOMITING THAT IS NOT CONTROLLED WITH YOUR NAUSEA MEDICATION  *UNUSUAL SHORTNESS OF BREATH  *UNUSUAL BRUISING OR BLEEDING  TENDERNESS IN MOUTH AND THROAT WITH OR WITHOUT PRESENCE OF ULCERS  *URINARY PROBLEMS  *BOWEL PROBLEMS  UNUSUAL RASH Items with * indicate a potential emergency and should be followed up as soon as possible.  Feel free to call the clinic should you have any questions or concerns. The clinic phone number is [phone].  Please show the CHEMO ALERT CARD at check-in to the Emergency Department and triage nurse.   

## 2019-02-28 NOTE — Progress Notes (Addendum)
[facility]  [phone] [phone]     ID: [name] DOB: [birth year]  MR#: [id number]  WUX#:[id number]  Patient Care Team: [name], NP as PCP - General (Internal Medicine) Michel Bickers, MD as PCP - Infectious Diseases (Infectious Diseases) Magrinat, Virgie Dad, MD as Consulting Physician (Oncology) Kyung Rudd, MD as Consulting Physician (Radiation Oncology) OTHER MD:   CHIEF COMPLAINT: Triple positive breast cancer  CURRENT TREATMENT: Adjuvant immunotherapy/adjuvant radiation  INTERVAL HISTORY: [name] returns today for follow up and treatment of her triple positive breast cancer.  She is here to receive adjuvant TDM1.   She notes she is tolerating it well.    Since her last visit she underwent an echocardiogram on 12/03/2018 that showed a well preserved ejection fraction of 60-65%.  She is scheduled for repeat on 03/06/2019.  [name] has increased right breast pain.  She has taken all of the percocet and oxycodone and is requesting more.  She has tried Gabapentin, but not regularly.  She hasn't noted an improvement with this.  She wants to know if she can get more percocet. This was last filled per PMP aware in 10/2018.  REVIEW OF SYSTEMS: [name] is fatigued.  She conitnues to work when she can.  She has been able to work with adequate social distancing.  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  She has no known exposures.    [name] denies fever, chills, cough, shortness of breath, chest pain, palpitations.  She hasn't noted nausea, vomiting, bowel/bladder changes, or any other concerns.  A detailed ROS was otherwise non contributory.    HISTORY OF CURRENT ILLNESS: From the original intake note:  [name] (pronounced "Huff") palpated a mass in the right breast for about 2 weeks before bringing it to medical attention. She underwent bilateral diagnostic  mammography with tomography and right breast ultrasonography at [facility] on 04/20/2018 showing: breast density category B. There was a highly suspicious mass located at 3 o'clock in the upper inner quadrant measuring 2.9 x 2.5 x 1.6 cm and 4 cm from the nipple. Ultrasound of the right axilla showed 5 lymph nodes with abnormal cortical thickening. The left axilla showed 3 lymph nodes with abnormal cortical thickening.  Accordingly on 04/26/2018 she proceeded to biopsy of the right breast area in question. The pathology from this procedure showed ([id number]): Invasive ductal carcinoma, grade II with calcifications. One right axillary lymph node and one left axillary lymph node were negative for carcinoma. Prognostic indicators significant for: estrogen receptor, 100% positive and progesterone receptor, 80% positive, both with strong staining intensity. Proliferation marker Ki67 at 70%. HER2 amplified with ratios HER2/CEP17 signals 4.52 and average HER2 copies per cell 10.85  The patient's subsequent history is as detailed below.  PAST MEDICAL HISTORY: Past Medical History:  Diagnosis Date  . Allergy   . Anemia    Normocytic  . Anxiety   . Asthma   . Blood dyscrasia   . Bronchitis 2005  . 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 Diabetes mellitus without complication ([place])   . Family history of breast cancer   . Family history of lung cancer   . Family history of prostate cancer   . Gastroenteritis 07/10/07  . GERD 07/24/2006  . GRAVE'S DISEASE 01/01/2008  . History of hidradenitis suppurativa   . History of kidney stones   . History of thrush   . HIV DISEASE 07/24/2006  dx March 05  . Hyperlipidemia   . HYPERTENSION 07/24/2006  . Hyperthyroidism 08/2006   Grave's Disease -diffuse radiotracer uptake 08/25/06 Thyroid scan-Cold nodule to R lower lobe of thyrorid  . Menometrorrhagia    hx of  . Nephrolithiasis   . 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 Pneumonia 2005  . 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 Sarcoidosis 02/08/2007   dx as a teenager in [place] from abnl CXR. Completed 2 yrs Prednisone after lung bx confirmation. No symptoms since then.  . Suppurative hidradenitis   . Thyroid cancer ([place])   . THYROID NODULE, RIGHT 02/08/2007  -2018 thyroid nodules were precancerous but pathology was negative for thyroid cancer.   PAST SURGICAL HISTORY: Past Surgical History:  Procedure Laterality Date  . BREAST LUMPECTOMY WITH RADIOACTIVE SEED AND SENTINEL LYMPH NODE BIOPSY Right 10/03/2018   Procedure: RIGHT BREAST LUMPECTOMY WITH RADIOACTIVE SEED AND SENTINEL LYMPH NODE MAPPING;  Surgeon: Erroll Luna, MD;  Location: [facility];  Service: General;  Laterality: Right;  . BREAST SURGERY  1997   Breast Reduction   . CYSTOSCOPY W/ URETERAL STENT REMOVAL  11/09/2012   Procedure: CYSTOSCOPY WITH STENT REMOVAL;  Surgeon: Alexis Frock, MD;  Location[facility];  Service: Urology;  Laterality: Right;  . CYSTOSCOPY WITH RETROGRADE PYELOGRAM, URETEROSCOPY AND STENT PLACEMENT  11/09/2012   Procedure: CYSTOSCOPY WITH RETROGRADE PYELOGRAM, URETEROSCOPY AND STENT PLACEMENT;  Surgeon: Alexis Frock, MD;  [facility];  Service: Urology;  Laterality: Left;  LEFT URETEROSCOPY, STONE MANIPULATION, left STENT exchange   . CYSTOSCOPY WITH STENT PLACEMENT  10/02/2012   Procedure: CYSTOSCOPY WITH STENT PLACEMENT;  Surgeon: Alexis Frock, MD;  [facility];  Service: Urology;  Laterality: Left;  . DILATION AND CURETTAGE OF UTERUS  Feb 2004   s/p for 1st trimester nonviable pregnancy  . EYE SURGERY     sty under eyelid  . INCISE AND DRAIN ABCESS  Nov 03   s/p I &D for righ inframmary fold hidradenitis  . INCISION AND DRAINAGE PERITONSILLAR ABSCESS  Mar 03  . IR CV LINE INJECTION  06/07/2018  . IR IMAGING GUIDED PORT INSERTION  06/20/2018  . IR REMOVAL TUN ACCESS W/ PORT W/O FL MOD SED  06/20/2018  .  IRRIGATION AND DEBRIDEMENT ABSCESS  01/31/2012   Procedure: IRRIGATION AND DEBRIDEMENT ABSCESS;  Surgeon: Shann Medal, MD;  [facility];  Service: General;  Laterality: Right;  right breast and axilla   . NEPHROLITHOTOMY  10/02/2012   Procedure: NEPHROLITHOTOMY PERCUTANEOUS;  Surgeon: Alexis Frock, MD;  [facility];  Service: Urology;  Laterality: Right;  First Stage Percutaneous Nephrolithotomy with Surgeon Access, Left Ureteral Stent    . NEPHROLITHOTOMY  10/04/2012   Procedure: NEPHROLITHOTOMY PERCUTANEOUS SECOND LOOK;  Surgeon: Alexis Frock, MD;  [facility];  Service: Urology;  Laterality: Right;     . NEPHROLITHOTOMY  10/08/2012   Procedure: NEPHROLITHOTOMY PERCUTANEOUS;  Surgeon: Alexis Frock, MD;  [facility];  Service: Urology;  Laterality: Right;  THIRD STAGE, nephrostomy tube exchange x 2  . NEPHROLITHOTOMY  10/11/2012   Procedure: NEPHROLITHOTOMY PERCUTANEOUS SECOND LOOK;  Surgeon: Alexis Frock, MD;  [facility];  Service: Urology;  Laterality: Right;  RIGHT 4 STAGE PERCUTANOUS NEPHROLITHOTOMY, right URETEROSCOPY WITH HOLMIUM LASER   . PORTACATH PLACEMENT Left 05/17/2018   Procedure: INSERTION PORT-A-CATH;  Surgeon: Coralie Keens, MD;  Location: [facility];  Service: General;  Laterality: Left;  . RADICAL NECK DISSECTION  04/12/2017   limited/notes 04/12/2017  . RADICAL NECK DISSECTION N/A 04/12/2017   Procedure: RADICAL NECK DISSECTION;  Surgeon: Melida Quitter, MD;  Location: [facility];  Service: ENT;  Laterality: N/A;  limited neck dissection 2 hours total  . REDUCTION MAMMAPLASTY Bilateral 1998  . [place]  1994  . THYROIDECTOMY  04/12/2017   completion/notes 04/12/2017  . THYROIDECTOMY N/A 04/12/2017   Procedure: THYROIDECTOMY;  Surgeon: Melida Quitter, MD;  Location: [facility];  Service: ENT;  Laterality: N/A;  Completion Thyroidectomy  . TOTAL THYROIDECTOMY  2010  Thyroid nodules removed- pathology at the ENT doctor 2018 were benign  -Over active thyroid and thyroidectomy with benign pathology  FAMILY HISTORY Family History  Problem Relation Age of Onset  . Hypertension Mother   . Cancer Mother        laryngeal  . Heart disease Mother        stent  . Hypertension Father   . Lung cancer Father 66       hx smoking  . Heart disease Other   . Hypertension Other   . Stroke Other        Grandparent  . Kidney disease Other        Grandparent  . Diabetes Other        FH of Diabetes  . Hypertension Sister   . Cancer Maternal Uncle        Lung CA  . Hypertension Brother   . Hypertension Sister   . Breast cancer Maternal Aunt 65  . Breast cancer Paternal Aunt 30  . Prostate cancer Paternal Uncle   . Breast cancer Maternal Aunt        dx 60+  . Breast cancer Paternal Aunt        dx 1's  . Breast cancer Paternal Aunt        dx 74's  . Prostate cancer Paternal Uncle   . Lung cancer Paternal Uncle   . Breast cancer Cousin 59  . Breast cancer Cousin        dx <50  . Breast cancer Cousin        dx <50  . Breast cancer Cousin        dx <50  . Colon cancer Neg Hx   . Esophageal cancer Neg Hx   . Rectal cancer Neg Hx   . Stomach cancer Neg Hx   The patient's mother is alive at age 4. The patient's father died at 76 from lung cancer (heavy smoker). She does not know much about her father. The patient has 1 brother and 2 sisters. The patient's mother had a history of laryngeal cancer. There was a maternal cousin with breast cancer diagnosed at age 26. There were 2 maternal aunts with breast cancer, the youngest being diagnosed in the 41's. There was a maternal great aunt with breast cancer. There were 2 paternal aunts with breast cancer.    GYNECOLOGIC HISTORY:  Patient's last menstrual period was 03/31/2014. Menarche: 53 years old Age at first live birth: 53 years old She is GX P1.  Her LMP was in 2017. She did not use HRT.    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 At home is the  patient's daughter, [name], age 27, who is a Engineer, technical sales education and working part-time at the [employer]. The patient attends [employer].     ADVANCED DIRECTIVES: Not in place; at the 05/10/2018 visit the patient was given the appropriate  documents to complete and notarized at her discretion   HEALTH MAINTENANCE: Social History   Tobacco Use  . Smoking status: Former Smoker    Packs/day: 0.50    Years: 15.00    Pack years: 7.50    Types: Cigarettes    Start date: 04/12/2017  . Smokeless tobacco: Never Used  . Tobacco comment: quit smoking  may 2018  Substance Use Topics  . Alcohol use: Yes    Alcohol/week: 0.0 standard drinks    Comment: social  . Drug use: No     Colonoscopy: no  PAP: [facility]. 2018  Bone density: remote   Allergies  Allergen Reactions  . Genvoya [Elviteg-Cobic-Emtricit-Tenofaf] Hives  . Lisinopril Cough  . Valsartan Other (See Comments) and Cough    Pt states she tolerates medicine now    Current Outpatient Medications  Medication Sig Dispense Refill  . amLODipine (NORVASC) 10 MG tablet Take 1 tablet (10 mg total) by mouth daily. For blood pressure. 90 tablet 3  . atorvastatin (LIPITOR) 20 MG tablet Take 1 tablet by mouth every evening for high cholesterol. NEED APPOINTMENT FOR ANY MORE REFILLS (Patient taking differently: Take 20 mg by mouth every evening. ) 30 tablet 0  . dolutegravir ([name]) 50 MG tablet Take 1 tablet (50 mg total) by mouth daily. 30 tablet 11  . doxycycline (VIBRA-TABS) 100 MG tablet 100 mg daily.     Marland Kitchen emtricitabine-tenofovir AF (DESCOVY) 200-25 MG tablet Take 1 tablet by mouth daily. 30 tablet 11  . fluconazole (DIFLUCAN) 100 MG tablet 2 tablets for first dose then 1 tab a day x 2 days 4 tablet 2  . gabapentin (NEURONTIN) 300 MG capsule TAKE 1 CAPSULE BY MOUTH THREE TIMES A DAY AS NEEDED    . irbesartan-hydrochlorothiazide (AVALIDE) 150-12.5 MG tablet TAKE 1 TABLET BY MOUTH ONCE  DAILY FOR BLOOD PRESSURE. 90 tablet 1  . levothyroxine (SYNTHROID, LEVOTHROID) 175 MCG tablet TAKE 1 TABLET BY MOUTH EVERY MORNING ON AN EMPTY STOMACH WITH A FULL GLASS OF WATER. (Patient taking differently: Take 175 mcg by mouth daily before breakfast. ) 30 tablet 5  . metFORMIN (GLUCOPHAGE) 500 MG tablet Take 1 tablet (500 mg total) by mouth 2 (two) times daily with a meal. 180 tablet 3  . metoprolol succinate (TOPROL-XL) 50 MG 24 hr tablet TAKE 1 TABLET (50 MG TOTAL) BY MOUTH DAILY. TAKE WITH OR IMMEDIATELY FOLLOWING A MEAL. 90 tablet 1  . mometasone (NASONEX) 50 MCG/ACT nasal spray PLACE 2 SPRAYS INTO THE NOSE DAILY. 17 g 3  . Multiple Vitamin (MULTIVITAMIN) tablet Take 1 tablet by mouth daily.    . ONE TOUCH LANCETS MISC Use as instructed to test blood sugar daily 200 each 5  . ONETOUCH VERIO test strip USE AS INSTRUCTED TO TEST BLOOD SUGAR DAILY NEED APPOINTMENT FOR ANY MORE REFILLS 50 each 0  . oxyCODONE (OXY IR/ROXICODONE) 5 MG immediate release tablet Take 1 tablet (5 mg total) by mouth every 6 (six) hours as needed for severe pain. 12 tablet 0  . oxyCODONE-acetaminophen (PERCOCET/ROXICET) 5-325 MG tablet TAKE 1 TABLET BY MOUTH 4 TIMES A DAY AS NEEDED    . potassium chloride (K-DUR) 10 MEQ tablet Take 2 tablets (20 mEq total) by mouth 2 (two) times daily. 20 tablet 0  . PROAIR RESPICLICK 330 (90 Base) MCG/ACT AEPB INHALE 2 PUFFS INTO THE LUNGS EVERY 6 HOURS AS NEEDED FOR WHEEZING OR SHORTNESS OF BREATH. 1 each 2  . spironolactone (ALDACTONE) 25 MG tablet Take 1  tablet (25 mg total) by mouth 2 (two) times daily. 60 tablet 3   No current facility-administered medications for this visit.    Facility-Administered Medications Ordered in Other Visits  Medication Dose Route Frequency Provider Last Rate Last Dose  . sodium chloride flush (NS) 0.9 % injection 10 mL  10 mL Intracatheter PRN Magrinat, Virgie Dad, MD   10 mL at 09/19/18 1551    OBJECTIVE:  Vitals:   02/28/19 1339  BP: (!) 143/79   Pulse: 80  Resp: 18  Temp: 99.1 F (37.3 C)  SpO2: 99%     Body mass index is 39.17 kg/m.   Wt Readings from Last 3 Encounters:  02/28/19 235 lb 6.4 oz (106.8 kg)  02/07/19 232 lb (105.2 kg)  01/17/19 232 lb (105.2 kg)  ECOG FS:1 - Symptomatic but completely ambulatory GENERAL: Patient is a tired appearing woman wearing surgical mask HEENT:  Sclerae anicteric.  Oropharynx clear and moist. No ulcerations or evidence of oropharyngeal candidiasis. Neck is supple.  NODES:  No cervical, supraclavicular, or axillary lymphadenopathy palpated.  BREAST EXAM: inspected only, s/p radiation with hyperpigmentation throughout LUNGS:  Clear to auscultation bilaterally.  No wheezes or rhonchi. HEART:  Regular rate and rhythm. No murmur appreciated. ABDOMEN:  Soft, nontender.  Positive, normoactive bowel sounds. No organomegaly palpated. MSK:  No focal spinal tenderness to palpation. Full range of motion bilaterally in the upper extremities. EXTREMITIES:  No peripheral edema.   SKIN:  Clear with no obvious rashes or skin changes. No nail dyscrasia. NEURO:  Nonfocal. Well oriented.  Appropriate affect.       LAB RESULTS:  CMP     Component Value Date/Time   NA 140 02/28/2019 1258   K 3.5 02/28/2019 1258   CL 102 02/28/2019 1258   CO2 31 02/28/2019 1258   GLUCOSE 122 (H) 02/28/2019 1258   BUN 20 02/28/2019 1258   CREATININE 1.42 (H) 02/28/2019 1258   CREATININE 1.28 (H) 05/10/2018 1458   CREATININE 1.02 09/23/2015 1047   CALCIUM 9.3 02/28/2019 1258   PROT 8.0 02/28/2019 1258   ALBUMIN 2.8 (L) 02/28/2019 1258   AST 32 02/28/2019 1258   AST 28 05/10/2018 1458   ALT 25 02/28/2019 1258   ALT 23 05/10/2018 1458   ALKPHOS 103 02/28/2019 1258   BILITOT <0.2 (L) 02/28/2019 1258   BILITOT <0.2 (L) 05/10/2018 1458   GFRNONAA 43 (L) 02/28/2019 1258   GFRNONAA 48 (L) 05/10/2018 1458   GFRAA 50 (L) 02/28/2019 1258   GFRAA 56 (L) 05/10/2018 1458    No results found for: TOTALPROTELP,  ALBUMINELP, A1GS, A2GS, BETS, BETA2SER, GAMS, MSPIKE, SPEI  No results found for: KPAFRELGTCHN, LAMBDASER, KAPLAMBRATIO  Lab Results  Component Value Date   WBC 6.5 02/28/2019   NEUTROABS 4.0 02/28/2019   HGB 11.4 (L) 02/28/2019   HCT 34.7 (L) 02/28/2019   MCV 89.9 02/28/2019   PLT 229 02/28/2019    _0 @  No results found for: LABCA2  No components found for: [id number]  No results for input(s): INR in the last 168 hours.  No results found for: LABCA2  No results found for: MMH680  No results found for: SUP103  No results found for: PRX458  No results found for: CA2729  No components found for: HGQUANT  No results found for: CEA1 / No results found for: CEA1   No results found for: AFPTUMOR  No results found for: [place]  No results found for: PSA1  Appointment on 02/28/2019  Component Date  Value Ref Range Status  . Sodium 02/28/2019 140  135 - 145 mmol/L Final  . Potassium 02/28/2019 3.5  3.5 - 5.1 mmol/L Final  . Chloride 02/28/2019 102  98 - 111 mmol/L Final  . CO2 02/28/2019 31  22 - 32 mmol/L Final  . Glucose, Bld 02/28/2019 122* 70 - 99 mg/dL Final  . BUN 02/28/2019 20  6 - 20 mg/dL Final  . Creatinine, Ser 02/28/2019 1.42* 0.44 - 1.00 mg/dL Final  . Calcium 02/28/2019 9.3  8.9 - 10.3 mg/dL Final  . Total Protein 02/28/2019 8.0  6.5 - 8.1 g/dL Final  . Albumin 02/28/2019 2.8* 3.5 - 5.0 g/dL Final  . AST 02/28/2019 32  15 - 41 U/L Final  . ALT 02/28/2019 25  0 - 44 U/L Final  . Alkaline Phosphatase 02/28/2019 103  38 - 126 U/L Final  . Total Bilirubin 02/28/2019 <0.2* 0.3 - 1.2 mg/dL Final  . GFR calc non Af Amer 02/28/2019 43* >60 mL/min Final  . GFR calc Af Amer 02/28/2019 50* >60 mL/min Final  . Anion gap 02/28/2019 7  5 - 15 Final   Performed at [facility], [place] [address] [place], [place] [address]  . WBC 02/28/2019 6.5  4.0 - 10.5 K/uL Final  . RBC 02/28/2019 3.86* 3.87 - 5.11 MIL/uL Final  . Hemoglobin  02/28/2019 11.4* 12.0 - 15.0 g/dL Final  . HCT 02/28/2019 34.7* 36.0 - 46.0 % Final  . MCV 02/28/2019 89.9  80.0 - 100.0 fL Final  . MCH 02/28/2019 29.5  26.0 - 34.0 pg Final  . MCHC 02/28/2019 32.9  30.0 - 36.0 g/dL Final  . RDW 02/28/2019 18.4* 11.5 - 15.5 % Final  . Platelets 02/28/2019 229  150 - 400 K/uL Final  . nRBC 02/28/2019 0.0  0.0 - 0.2 % Final  . Neutrophils Relative % 02/28/2019 61  % Final  . Neutro Abs 02/28/2019 4.0  1.7 - 7.7 K/uL Final  . Lymphocytes Relative 02/28/2019 25  % Final  . Lymphs Abs 02/28/2019 1.6  0.7 - 4.0 K/uL Final  . Monocytes Relative 02/28/2019 10  % Final  . Monocytes Absolute 02/28/2019 0.6  0.1 - 1.0 K/uL Final  . Eosinophils Relative 02/28/2019 2  % Final  . Eosinophils Absolute 02/28/2019 0.1  0.0 - 0.5 K/uL Final  . Basophils Relative 02/28/2019 1  % Final  . Basophils Absolute 02/28/2019 0.0  0.0 - 0.1 K/uL Final  . Immature Granulocytes 02/28/2019 1  % Final  . Abs Immature Granulocytes 02/28/2019 0.05  0.00 - 0.07 K/uL Final   Performed at [facility], [address] [place], [place] [address]    (this displays the last labs from the last 3 days)  No results found for: TOTALPROTELP, ALBUMINELP, A1GS, A2GS, BETS, BETA2SER, GAMS, MSPIKE, SPEI (this displays SPEP labs)  No results found for: KPAFRELGTCHN, LAMBDASER, KAPLAMBRATIO (kappa/lambda light chains)  No results found for: HGBA, HGBA2QUANT, HGBFQUANT, HGBSQUAN (Hemoglobinopathy evaluation)   No results found for: LDH  Lab Results  Component Value Date   IRON 26 (L) 02/08/2007   TIBC 371 02/08/2007   IRONPCTSAT 7 (L) 02/08/2007   (Iron and TIBC)  No results found for: FERRITIN  Urinalysis    Component Value Date/Time   COLORURINE YELLOW 10/21/2014 2347   APPEARANCEUR CLOUDY (A) 10/21/2014 2347   LABSPEC 1.014 10/21/2014 2347   PHURINE 6.0 10/21/2014 2347   GLUCOSEU NEGATIVE 10/21/2014 2347   GLUCOSEU NEG mg/dL 01/23/2008  [place] (A) 10/21/2014 2347   BILIRUBINUR negative 12/29/2017 1046   [place] 10/21/2014 2347   PROTEINUR 2+ 12/29/2017 1046   PROTEINUR 100 (A) 10/21/2014 2347   UROBILINOGEN 0.2 12/29/2017 1046   UROBILINOGEN 0.2 10/21/2014 2347   NITRITE negative 12/29/2017 1046   NITRITE NEGATIVE 10/21/2014 2347   LEUKOCYTESUR Large (3+) (A) 12/29/2017 1046     STUDIES:   ELIGIBLE FOR AVAILABLE RESEARCH PROTOCOL: UPBEAT  ASSESSMENT: 53 y.o. [place], [place] woman status post right breast upper inner quadrant biopsy 04/26/2018 for a clinical T2 N0, stage Ib invasive ductal carcinoma, grade 2, ER/PR positive, HER-2 positive with an MIB-1 of 70%.  (1) genetics testing 07/03/2018: no pathogenic mutations. Genes Analyzed (43 total): APC*, ATM*, AXIN2, BARD1, BMPR1A, BRCA1*, BRCA2*, BRIP1*, CDH1*, CDK4, CDKN2A, CHEK2*, DICER1,GALNT12, HOXB13, MEN1, MLH1*, MRE11A, MSH2*, MSH3, MSH6*, MUTYH*, NBN, NF1*, NTHL1, PALB2*, PMS2*, POLD1, POLE,PTEN*, RAD50, RAD51C*, RAD51D*, RET, SDHB, SDHD, SMAD4, SMARCA4, STK11 and TP53* (sequencing and deletion/duplication); EGFR (sequencing only); EPCAM and GREM1 (deletion/duplication only). DNA and RNA analyses performed for * genes.  (2) neoadjuvant chemotherapy will consist of carboplatin, docetaxel, trastuzumab and Pertuzumab given every 21 days x 6, starting 05/31/2018  (a) Pertuzumab discontinued starting with cycle 2 because of diarrhea problems  (b) Taxotere stopped after 4 cycles due peripheral neuropathy and substituted for last two cycles with Gemcitabine  (3) trastuzumab adjuvantly today (10/23/18 and during radiation), TDM1 x 8 cycles planned adjuvantly due to residual disease.  (a) echo on 05/16/2018 shows an EF of 60-65%.  (b) echo on 08/27/18 shows an EF 55-60%  (c) echo on 12/03/2018 shows an EF of 60-65%  (4) definitive surgery on 10/03/2018: IDC grade 1, 1.1 cm, DCIS, margins close but negative, 5 SLN negative.  [id number]  (5) adjuvant radiation completed  02/21/2019 (delayed due to wound healing issues)   (6) Tamoxifen starting 02/28/2019  (7) hidradenitis/boils: on doxycycline daily  PLAN: Naviyah is doing well today.  Her labs are stable.  She tolerated radiation and is healing as she should be.  The main issue since then has been breast pain.  I recommended she take Gabapentin 321m TID on a scheduled basis instead of just as needed.  I reviewed with Dr. MJana Hakimwho also recommends extra strength tylenol and aleve together as often as TID PRN for pain instead of percocet.    She is tolerating the TDM1 without difficulty with the exception of fatigue.  She will continue this every three weeks, and she will do this for 8 cycles, after which she will transition back to Trastuzumab every 3 weeks until she completes one year of therapy.   Dr. MJana Hakimcame in and reviewed antiestrogen therapy with her in detail.  After thorough discussion of side effects of both anastrozole and Tamoxifen, ASohanahas decided on Tamoxifen.  I sent in a prescription for this.  Dr. mJana Hakimwould like to see her in September after a mammogram in August.    ALinethwill return every 3 weeks for labs and her TDM1.  I will see her back on her final TDM1 cycle (in 6 weeks) so we can get all of her orders changed and schedule her accordingly for the Trastuzumab.    She knows to call for any questions or concerns that may arise between now and then.     LWilber Bihari NP 02/28/19    ADDENDUM: AYeceniahas done well with her local treatment although she is still having significant discomfort in the  right breast/chest wall area.  We discussed the fact that this is due to scar tissue, not cancer but scar tissue is alive and changing in time and trapped nerves and that is the reason for her current pain.  She tells [place] that oxycodone was very helpful but I am reluctant to use it for this type of pain.  Instead she will use gabapentin more regularly and if that does not work she  will take Tylenol together with naproxen.  She will let us know how she does with that.  We then discussed antiestrogens, which she is ready to start.  We discussed the difference between anastrozole and tamoxifen in detail.  She is going to start with tamoxifen and we have gone ahead and placed that prescription.  She will see me in approximately 3 months to make sure she is tolerating it well.  If not we will choose a different agent  I personally saw this patient and performed a substantive portion of this encounter with the listed APP documented above.   Chauncey Cruel, MD Medical Oncology and Hematology [facility] [address] [place], [place] [address] Tel. [phone]    Fax. [phone]

## 2019-03-01 ENCOUNTER — Telehealth: Payer: Self-pay | Admitting: Adult Health

## 2019-03-01 NOTE — Telephone Encounter (Signed)
Called regarding schedule patient request fridays

## 2019-03-04 ENCOUNTER — Other Ambulatory Visit: Payer: Self-pay | Admitting: *Deleted

## 2019-03-04 MED ORDER — FLUCONAZOLE 100 MG PO TABS: ORAL_TABLET | ORAL | 0 refills | Status: DC

## 2019-03-05 ENCOUNTER — Encounter: Payer: Self-pay | Admitting: *Deleted

## 2019-03-06 ENCOUNTER — Other Ambulatory Visit (HOSPITAL_COMMUNITY): Payer: 59

## 2019-03-06 ENCOUNTER — Ambulatory Visit (HOSPITAL_COMMUNITY)
Admission: RE | Admit: 2019-03-06 | Discharge: 2019-03-06 | Disposition: A | Discharge status: Home / Self Care | Payer: 59 | Source: Ambulatory Visit | Attending: Internal Medicine | Admitting: Internal Medicine

## 2019-03-06 ENCOUNTER — Encounter (HOSPITAL_COMMUNITY): Payer: 59 | Admitting: Internal Medicine

## 2019-03-06 ENCOUNTER — Other Ambulatory Visit: Payer: Self-pay

## 2019-03-06 DIAGNOSIS — C50311 Malignant neoplasm of lower-inner quadrant of right female breast: Secondary | ICD-10-CM | POA: Insufficient documentation

## 2019-03-06 DIAGNOSIS — E785 Hyperlipidemia, unspecified: Secondary | ICD-10-CM | POA: Diagnosis not present

## 2019-03-06 DIAGNOSIS — E119 Type 2 diabetes mellitus without complications: Secondary | ICD-10-CM | POA: Diagnosis not present

## 2019-03-06 DIAGNOSIS — Z170 Estrogen receptor positive status [ER+]: Secondary | ICD-10-CM | POA: Insufficient documentation

## 2019-03-06 DIAGNOSIS — C50919 Malignant neoplasm of unspecified site of unspecified female breast: Secondary | ICD-10-CM | POA: Diagnosis not present

## 2019-03-06 DIAGNOSIS — I10 Essential (primary) hypertension: Secondary | ICD-10-CM | POA: Diagnosis not present

## 2019-03-06 DIAGNOSIS — I340 Nonrheumatic mitral (valve) insufficiency: Secondary | ICD-10-CM | POA: Diagnosis not present

## 2019-03-06 DIAGNOSIS — F17200 Nicotine dependence, unspecified, uncomplicated: Secondary | ICD-10-CM | POA: Insufficient documentation

## 2019-03-06 NOTE — Progress Notes (Signed)
  Echocardiogram 2D Echocardiogram has been performed.  [name] L Androw 03/06/2019, 11:00 AM

## 2019-03-07 ENCOUNTER — Ambulatory Visit: Payer: 59 | Admitting: Rehabilitation

## 2019-03-12 ENCOUNTER — Encounter: Payer: 59 | Admitting: Rehabilitation

## 2019-03-13 ENCOUNTER — Ambulatory Visit (HOSPITAL_COMMUNITY)
Admission: RE | Admit: 2019-03-13 | Discharge: 2019-03-13 | Disposition: A | Discharge status: Home / Self Care | Payer: 59 | Source: Ambulatory Visit | Attending: Internal Medicine | Admitting: Internal Medicine

## 2019-03-13 ENCOUNTER — Encounter (HOSPITAL_COMMUNITY): Payer: Self-pay

## 2019-03-13 ENCOUNTER — Other Ambulatory Visit: Payer: Self-pay

## 2019-03-13 DIAGNOSIS — Z170 Estrogen receptor positive status [ER+]: Secondary | ICD-10-CM

## 2019-03-13 DIAGNOSIS — C50311 Malignant neoplasm of lower-inner quadrant of right female breast: Secondary | ICD-10-CM

## 2019-03-13 NOTE — Addendum Note (Signed)
Encounter addended by: Marlise Eves, RN on: 03/13/2019 1:44 PM  Actions taken: Order list changed, Diagnosis association updated, Clinical Note Signed

## 2019-03-13 NOTE — Progress Notes (Signed)
No med changes. Follow up sent to scheduler. avs sent via mychart.

## 2019-03-13 NOTE — Patient Instructions (Signed)
Your physician has requested that you have an echocardiogram. Echocardiography is a painless test that uses sound waves to create images of your heart. It provides your doctor with information about the size and shape of your heart and how well your heart's chambers and valves are working. This procedure takes approximately one hour. There are no restrictions for this procedure. This will be done at your follow up appointment with Dr. Haroldine Laws.  Please follow up with Dr. Haroldine Laws in 3 months with an echocardiogram.

## 2019-03-13 NOTE — Progress Notes (Signed)
Heart Failure TeleHealth Note  Due to national recommendations of social distancing due to [place] 19, Audio/video telehealth visit is felt to be most appropriate for this patient at this time.  See MyChart message from today for patient consent regarding telehealth for [name].  Date:  03/13/2019   ID:  [name], DOB 04-15-1966, MRN [id number]  Location: Home  Provider location: [facility] Type of Visit: Established patient  PCP:  [name], NP  Cardiologist:  No primary care provider on file. Primary HF: Taylyn Brame  Chief Complaint: Heart Failure follow-up   History of Present Illness:  [name] is a  53 y.o. female with h/o DM, Graves disease, thyroid CA, HIV, HTN and right breast cancer referred by Dr. Doris Cheadle for enrollment into [facility] program for EF monitoring during chemotherapy/   Right breast CA -  upper inner quadrant biopsy 04/26/2018 for a clinical T2 N0, stage Ib invasive ductal carcinoma, grade 2, with an MIB-1 of 70%.   (1) neoadjuvant chemotherapy with carboplatin, docetaxel, trastuzumab and Pertuzumab given every 21 days x 6, starting 05/31/2018 (a) Pertuzumab discontinued starting with cycle 2 because of diarrhea problems  (3) trastuzumab to continue to total 6 months (a) echo on 05/16/2018 shows an EF of 60-65%. (b) echo on 08/27/18 shows an EF 55-60%             (c) echo on 12/03/18 EF 60-65% GLS -18.8  (d) echo on 03/06/19 EF 55% (read as 40-45%) GLS 15.7% - underestimated  (4) s/p lumpectomy 12/19  (5) adjuvant radiation completed 5/20  (6) antiestrogens to start at Felicia completion of local treatment  He presents via audio/video conferencing for a telehealth visit today. She feels good. Tolerating Herceptin well. No HF symptoms. Still feels rundown after completing XRT earlier this month.     Echo on 03/06/19 EF 55-60% (read as 40-45%) GLS 15.7% -  underestimated  [name] denies symptoms worrisome for COVID 19.   Past Medical History:  Diagnosis Date  . Allergy   . Anemia    Normocytic  . Anxiety   . Asthma   . Blood dyscrasia   . Bronchitis 2005  . 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 Diabetes mellitus without complication ([place])   . Family history of breast cancer   . Family history of lung cancer   . Family history of prostate cancer   . Gastroenteritis 07/10/07  . GERD 07/24/2006  . GRAVE'S DISEASE 01/01/2008  . History of hidradenitis suppurativa   . History of kidney stones   . History of thrush   . HIV DISEASE 07/24/2006   dx March 05  . Hyperlipidemia   . HYPERTENSION 07/24/2006  . Hyperthyroidism 08/2006   Grave's Disease -diffuse radiotracer uptake 08/25/06 Thyroid scan-Cold nodule to R lower lobe of thyrorid  . Menometrorrhagia    hx of  . Nephrolithiasis   . 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 Pneumonia 2005  . 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 Sarcoidosis 02/08/2007   dx as a teenager in [place] from abnl CXR. Completed 2 yrs Prednisone after lung bx confirmation. No symptoms since then.  . Suppurative hidradenitis   . Thyroid cancer ([place])   . THYROID NODULE, RIGHT 02/08/2007   Past Surgical History:  Procedure Laterality Date  . BREAST LUMPECTOMY WITH RADIOACTIVE SEED AND SENTINEL LYMPH NODE BIOPSY Right 10/03/2018   Procedure: RIGHT BREAST LUMPECTOMY WITH RADIOACTIVE  SEED AND SENTINEL LYMPH NODE MAPPING;  Surgeon: Erroll Luna, MD;  Location: [facility];  Service: General;  Laterality: Right;  . BREAST SURGERY  1997   Breast Reduction   . CYSTOSCOPY W/ URETERAL STENT REMOVAL  11/09/2012   Procedure: CYSTOSCOPY WITH STENT REMOVAL;  Surgeon: Alexis Frock, MD;  Location: [facility];  Service: Urology;  Laterality: Right;  . CYSTOSCOPY WITH RETROGRADE PYELOGRAM, URETEROSCOPY AND STENT PLACEMENT  11/09/2012    Procedure: CYSTOSCOPY WITH RETROGRADE PYELOGRAM, URETEROSCOPY AND STENT PLACEMENT;  Surgeon: Alexis Frock, MD;  Location: [facility];  Service: Urology;  Laterality: Left;  LEFT URETEROSCOPY, STONE MANIPULATION, left STENT exchange   . CYSTOSCOPY WITH STENT PLACEMENT  10/02/2012   Procedure: CYSTOSCOPY WITH STENT PLACEMENT;  Surgeon: Alexis Frock, MD;  Location: [facility];  Service: Urology;  Laterality: Left;  . DILATION AND CURETTAGE OF UTERUS  Feb 2004   s/p for 1st trimester nonviable pregnancy  . EYE SURGERY     sty under eyelid  . INCISE AND DRAIN ABCESS  Nov 03   s/p I &D for righ inframmary fold hidradenitis  . INCISION AND DRAINAGE PERITONSILLAR ABSCESS  Mar 03  . IR CV LINE INJECTION  06/07/2018  . IR IMAGING GUIDED PORT INSERTION  06/20/2018  . IR REMOVAL TUN ACCESS W/ PORT W/O FL MOD SED  06/20/2018  . IRRIGATION AND DEBRIDEMENT ABSCESS  01/31/2012   Procedure: IRRIGATION AND DEBRIDEMENT ABSCESS;  Surgeon: Shann Medal, MD;  Location: [facility];  Service: General;  Laterality: Right;  right breast and axilla   . NEPHROLITHOTOMY  10/02/2012   Procedure: NEPHROLITHOTOMY PERCUTANEOUS;  Surgeon: Alexis Frock, MD;  Location[facility];  Service: Urology;  Laterality: Right;  First Stage Percutaneous Nephrolithotomy with Surgeon Access, Left Ureteral Stent    . NEPHROLITHOTOMY  10/04/2012   Procedure: NEPHROLITHOTOMY PERCUTANEOUS SECOND LOOK;  Surgeon: Alexis Frock, MD;  Location[facility];  Service: Urology;  Laterality: Right;     . NEPHROLITHOTOMY  10/08/2012   Procedure: NEPHROLITHOTOMY PERCUTANEOUS;  Surgeon: Alexis Frock, MD;  Location[facility];  Service: Urology;  Laterality: Right;  THIRD STAGE, nephrostomy tube exchange x 2  . NEPHROLITHOTOMY  10/11/2012   Procedure: NEPHROLITHOTOMY PERCUTANEOUS SECOND LOOK;  Surgeon: Alexis Frock, MD;  Location[facility];  Service: Urology;  Laterality: Right;  RIGHT 4 STAGE PERCUTANOUS NEPHROLITHOTOMY, right URETEROSCOPY WITH HOLMIUM LASER   .  PORTACATH PLACEMENT Left 05/17/2018   Procedure: INSERTION PORT-A-CATH;  Surgeon: Coralie Keens, MD;  Location: [facility];  Service: General;  Laterality: Left;  . RADICAL NECK DISSECTION  04/12/2017   limited/notes 04/12/2017  . RADICAL NECK DISSECTION N/A 04/12/2017   Procedure: RADICAL NECK DISSECTION;  Surgeon: Melida Quitter, MD;  Location: [facility];  Service: ENT;  Laterality: N/A;  limited neck dissection 2 hours total  . REDUCTION MAMMAPLASTY Bilateral 1998  . [place]  1994  . THYROIDECTOMY  04/12/2017   completion/notes 04/12/2017  . THYROIDECTOMY N/A 04/12/2017   Procedure: THYROIDECTOMY;  Surgeon: Melida Quitter, MD;  Location: [facility];  Service: ENT;  Laterality: N/A;  Completion Thyroidectomy  . TOTAL THYROIDECTOMY  2010     Current Outpatient Medications  Medication Sig Dispense Refill  . amLODipine (NORVASC) 10 MG tablet Take 1 tablet (10 mg total) by mouth daily. For blood pressure. 90 tablet 3  . atorvastatin (LIPITOR) 20 MG tablet Take 1 tablet by mouth every evening for high cholesterol. NEED APPOINTMENT FOR ANY MORE REFILLS (Patient taking differently: Take 20 mg by mouth  every evening. ) 30 tablet 0  . dolutegravir ([name]) 50 MG tablet Take 1 tablet (50 mg total) by mouth daily. 30 tablet 11  . doxycycline (VIBRA-TABS) 100 MG tablet 100 mg daily.     Marland Kitchen emtricitabine-tenofovir AF (DESCOVY) 200-25 MG tablet Take 1 tablet by mouth daily. 30 tablet 11  . fluconazole (DIFLUCAN) 100 MG tablet 2 tablets for first dose then 1 tab a day x 2 days 4 tablet 0  . gabapentin (NEURONTIN) 300 MG capsule Take 1 capsule (300 mg total) by mouth 3 (three) times daily. 90 capsule 0  . irbesartan-hydrochlorothiazide (AVALIDE) 150-12.5 MG tablet TAKE 1 TABLET BY MOUTH ONCE DAILY FOR BLOOD PRESSURE. 90 tablet 1  . levothyroxine (SYNTHROID, LEVOTHROID) 175 MCG tablet TAKE 1 TABLET BY MOUTH EVERY MORNING ON AN EMPTY STOMACH WITH A FULL GLASS OF WATER. (Patient taking differently: Take  175 mcg by mouth daily before breakfast. ) 30 tablet 5  . metFORMIN (GLUCOPHAGE) 500 MG tablet Take 1 tablet (500 mg total) by mouth 2 (two) times daily with a meal. 180 tablet 3  . metoprolol succinate (TOPROL-XL) 50 MG 24 hr tablet TAKE 1 TABLET (50 MG TOTAL) BY MOUTH DAILY. TAKE WITH OR IMMEDIATELY FOLLOWING A MEAL. 90 tablet 1  . mometasone (NASONEX) 50 MCG/ACT nasal spray PLACE 2 SPRAYS INTO Felicia NOSE DAILY. 17 g 3  . Multiple Vitamin (MULTIVITAMIN) tablet Take 1 tablet by mouth daily.    . ONE TOUCH LANCETS MISC Use as instructed to test blood sugar daily 200 each 5  . ONETOUCH VERIO test strip USE AS INSTRUCTED TO TEST BLOOD SUGAR DAILY NEED APPOINTMENT FOR ANY MORE REFILLS 50 each 0  . oxyCODONE-acetaminophen (PERCOCET/ROXICET) 5-325 MG tablet TAKE 1 TABLET BY MOUTH 4 TIMES A DAY AS NEEDED    . potassium chloride (K-DUR) 10 MEQ tablet Take 2 tablets (20 mEq total) by mouth 2 (two) times daily. 20 tablet 0  . PROAIR RESPICLICK 503 (90 Base) MCG/ACT AEPB INHALE 2 PUFFS INTO Felicia LUNGS EVERY 6 HOURS AS NEEDED FOR WHEEZING OR SHORTNESS OF BREATH. 1 each 2  . spironolactone (ALDACTONE) 25 MG tablet Take 1 tablet (25 mg total) by mouth 2 (two) times daily. 60 tablet 3  . tamoxifen (NOLVADEX) 20 MG tablet Take 1 tablet (20 mg total) by mouth daily. 30 tablet 5   No current facility-administered medications for this encounter.    Facility-Administered Medications Ordered in Other Encounters  Medication Dose Route Frequency Provider Last Rate Last Dose  . sodium chloride flush (NS) 0.9 % injection 10 mL  10 mL Intracatheter PRN Magrinat, Virgie Dad, MD   10 mL at 09/19/18 1551    Allergies:   Genvoya [elviteg-cobic-emtricit-tenofaf]; Lisinopril; and Valsartan   Social History:  Felicia patient  reports that she has quit smoking. Her smoking use included cigarettes. She started smoking about 23 months ago. She has a 7.50 pack-year smoking history. She has never used smokeless tobacco. She reports current  alcohol use. She reports that she does not use drugs.   Family History:  Felicia patient's family history includes Breast cancer in her cousin, cousin, cousin, maternal aunt, paternal aunt, and paternal aunt; Breast cancer (age of onset: 37) in her cousin; Breast cancer (age of onset: 48) in her paternal aunt; Breast cancer (age of onset: 44) in her maternal aunt; Cancer in her maternal uncle and mother; Diabetes in an other family member; Heart disease in her mother and another family member; Hypertension in her brother, father, mother,  sister, sister, and another family member; Kidney disease in an other family member; Lung cancer in her paternal uncle; Lung cancer (age of onset: 86) in her father; Prostate cancer in her paternal uncle and paternal uncle; Stroke in an other family member.   ROS:  Please see Felicia history of present illness.   All other systems are personally reviewed and negative.   Exam:  (Video/Tele Health Call; Exam is subjective and or/visual.) General:  Speaks in full sentences. No resp difficulty. Lungs: Normal respiratory effort with conversation.  Abdomen: Non-distended per patient report Extremities: Pt denies edema. Neuro: Alert & oriented x 3.   Recent Labs: 08/24/2018: TSH 2.69 02/28/2019: ALT 25; BUN 20; Creatinine, Ser 1.42; Hemoglobin 11.4; Platelets 229; Potassium 3.5; Sodium 140  Personally reviewed   Wt Readings from Last 3 Encounters:  02/28/19 106.8 kg (235 lb 6.4 oz)  02/07/19 105.2 kg (232 lb)  01/17/19 105.2 kg (232 lb)      ASSESSMENT AND PLAN:  1. R Breast Cancer - I reviewed echos personally. I do not think reported EF 40-45% in Felicia computer is correct. EF 55-60% (stable). No evidence of herceptin toxicity. Continue Herceptin. Repeat echo in 3 months. (will complete 12/20)   COVID screen Felicia patient does not have any symptoms that suggest any further testing/ screening at this time.  Social distancing reinforced today.  Recommended follow-up:  3  months with echo   Relevant cardiac medications were reviewed at length with Felicia patient today.   Felicia patient does not have concerns regarding their medications at this time.   Felicia following changes were made today:  As above  Today, I have spent 14  minutes with Felicia patient with telehealth technology discussing Felicia above issues .    Signed, Glori Bickers, MD  03/13/2019 12:01 PM  [facility] [address] [facility] [place] [address] [phone] (office) [phone] (fax)

## 2019-03-18 ENCOUNTER — Ambulatory Visit: Payer: 59 | Attending: Radiation Oncology | Admitting: Rehabilitation

## 2019-03-18 ENCOUNTER — Other Ambulatory Visit: Payer: Self-pay

## 2019-03-18 ENCOUNTER — Encounter: Payer: Self-pay | Admitting: Rehabilitation

## 2019-03-18 DIAGNOSIS — Z9889 Other specified postprocedural states: Secondary | ICD-10-CM | POA: Diagnosis present

## 2019-03-18 DIAGNOSIS — M25511 Pain in right shoulder: Secondary | ICD-10-CM | POA: Insufficient documentation

## 2019-03-18 DIAGNOSIS — Z170 Estrogen receptor positive status [ER+]: Secondary | ICD-10-CM | POA: Diagnosis present

## 2019-03-18 DIAGNOSIS — R208 Other disturbances of skin sensation: Secondary | ICD-10-CM | POA: Diagnosis present

## 2019-03-18 DIAGNOSIS — C50311 Malignant neoplasm of lower-inner quadrant of right female breast: Secondary | ICD-10-CM | POA: Insufficient documentation

## 2019-03-18 NOTE — Patient Instructions (Signed)
Access Code: [email or web address]  URL: [email or web address]  Date: 03/18/2019  Prepared by: Shan Levans   Exercises  Isometric Shoulder Internal Rotation - 10 reps - 1 sets - 6second hold - 1x daily - 7x weekly  Isometric Shoulder External Rotation - 10 reps - 1 sets - 6 seconds hold - 1x daily - 7x weekly  Isometric Shoulder Flexion - 10 reps - 1 sets - 6 second hold - 1x daily - 7x weekly  Supine Single Arm Shoulder Protraction - 10 reps - 1 sets - 1 second hold - 1x daily - 7x weekly

## 2019-03-18 NOTE — Therapy (Addendum)
[place], [place], [address] Phone: [phone]   Fax:  [phone]  Physical Therapy Treatment  Patient Details  Name: [name] MRN: [id number] Date of Birth: 12-May-1966 Referring Provider (PT): Dr. Lisbeth Renshaw   Encounter Date: 03/18/2019  PT End of Session - 03/18/19 1015    Visit Number  3    Number of Visits  7    Date for PT Re-Evaluation  05/13/19    PT Start Time  0942   arriving late   PT Stop Time  1012   needing to leave for work   PT Time Calculation (min)  30 min    Activity Tolerance  Patient tolerated treatment well    Behavior During Therapy  [facility] for tasks assessed/performed       Past Medical History:  Diagnosis Date  . Allergy   . Anemia    Normocytic  . Anxiety   . Asthma   . Blood dyscrasia   . Bronchitis 2005  . 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 Diabetes mellitus without complication ([place])   . Family history of breast cancer   . Family history of lung cancer   . Family history of prostate cancer   . Gastroenteritis 07/10/07  . GERD 07/24/2006  . GRAVE'S DISEASE 01/01/2008  . History of hidradenitis suppurativa   . History of kidney stones   . History of thrush   . HIV DISEASE 07/24/2006   dx March 05  . Hyperlipidemia   . HYPERTENSION 07/24/2006  . Hyperthyroidism 08/2006   Grave's Disease -diffuse radiotracer uptake 08/25/06 Thyroid scan-Cold nodule to R lower lobe of thyrorid  . Menometrorrhagia    hx of  . Nephrolithiasis   . 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 Pneumonia 2005  . 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 Sarcoidosis 02/08/2007   dx as a teenager in [place] from abnl CXR. Completed 2 yrs Prednisone after lung bx confirmation. No symptoms since then.  . Suppurative hidradenitis   . Thyroid cancer ([place])   . THYROID NODULE, RIGHT 02/08/2007    Past Surgical  History:  Procedure Laterality Date  . BREAST LUMPECTOMY WITH RADIOACTIVE SEED AND SENTINEL LYMPH NODE BIOPSY Right 10/03/2018   Procedure: RIGHT BREAST LUMPECTOMY WITH RADIOACTIVE SEED AND SENTINEL LYMPH NODE MAPPING;  Surgeon: Erroll Luna, MD;  Location: [facility];  Service: General;  Laterality: Right;  . BREAST SURGERY  1997   Breast Reduction   . CYSTOSCOPY W/ URETERAL STENT REMOVAL  11/09/2012   Procedure: CYSTOSCOPY WITH STENT REMOVAL;  Surgeon: Alexis Frock, MD;  Location: [facility];  Service: Urology;  Laterality: Right;  . CYSTOSCOPY WITH RETROGRADE PYELOGRAM, URETEROSCOPY AND STENT PLACEMENT  11/09/2012   Procedure: CYSTOSCOPY WITH RETROGRADE PYELOGRAM, URETEROSCOPY AND STENT PLACEMENT;  Surgeon: Alexis Frock, MD;  Location: [facility];  Service: Urology;  Laterality: Left;  LEFT URETEROSCOPY, STONE MANIPULATION, left STENT exchange   . CYSTOSCOPY WITH STENT PLACEMENT  10/02/2012   Procedure: CYSTOSCOPY WITH STENT PLACEMENT;  Surgeon: Alexis Frock, MD;  Location: [facility];  Service: Urology;  Laterality: Left;  . DILATION AND CURETTAGE OF UTERUS  Feb 2004   s/p for 1st trimester nonviable pregnancy  . EYE SURGERY     sty under eyelid  . INCISE AND DRAIN ABCESS  Nov 03   s/p I &D for righ inframmary fold hidradenitis  . INCISION AND DRAINAGE PERITONSILLAR ABSCESS  Mar 03  . IR CV  LINE INJECTION  06/07/2018  . IR IMAGING GUIDED PORT INSERTION  06/20/2018  . IR REMOVAL TUN ACCESS W/ PORT W/O FL MOD SED  06/20/2018  . IRRIGATION AND DEBRIDEMENT ABSCESS  01/31/2012   Procedure: IRRIGATION AND DEBRIDEMENT ABSCESS;  Surgeon: Shann Medal, MD;  Location: [facility];  Service: General;  Laterality: Right;  right breast and axilla   . NEPHROLITHOTOMY  10/02/2012   Procedure: NEPHROLITHOTOMY PERCUTANEOUS;  Surgeon: Alexis Frock, MD;  Location: [facility];  Service: Urology;  Laterality: Right;  First Stage Percutaneous Nephrolithotomy with Surgeon Access, Left Ureteral Stent    . NEPHROLITHOTOMY   10/04/2012   Procedure: NEPHROLITHOTOMY PERCUTANEOUS SECOND LOOK;  Surgeon: Alexis Frock, MD;  [facility];  Service: Urology;  Laterality: Right;     . NEPHROLITHOTOMY  10/08/2012   Procedure: NEPHROLITHOTOMY PERCUTANEOUS;  Surgeon: Alexis Frock, MD;  Location[facility];  Service: Urology;  Laterality: Right;  THIRD STAGE, nephrostomy tube exchange x 2  . NEPHROLITHOTOMY  10/11/2012   Procedure: NEPHROLITHOTOMY PERCUTANEOUS SECOND LOOK;  Surgeon: Alexis Frock, MD;  Location[facility];  Service: Urology;  Laterality: Right;  RIGHT 4 STAGE PERCUTANOUS NEPHROLITHOTOMY, right URETEROSCOPY WITH HOLMIUM LASER   . PORTACATH PLACEMENT Left 05/17/2018   Procedure: INSERTION PORT-A-CATH;  Surgeon: Coralie Keens, MD;  Location: [facility];  Service: General;  Laterality: Left;  . RADICAL NECK DISSECTION  04/12/2017   limited/notes 04/12/2017  . RADICAL NECK DISSECTION N/A 04/12/2017   Procedure: RADICAL NECK DISSECTION;  Surgeon: Melida Quitter, MD;  Location: [facility];  Service: ENT;  Laterality: N/A;  limited neck dissection 2 hours total  . REDUCTION MAMMAPLASTY Bilateral 1998  . [place]  1994  . THYROIDECTOMY  04/12/2017   completion/notes 04/12/2017  . THYROIDECTOMY N/A 04/12/2017   Procedure: THYROIDECTOMY;  Surgeon: Melida Quitter, MD;  Location: [facility];  Service: ENT;  Laterality: N/A;  Completion Thyroidectomy  . TOTAL THYROIDECTOMY  2010    There were no vitals filed for this visit.  Subjective Assessment - 03/18/19 0944    Subjective  radiation is over.  The skin is a little tender.  I am much better.  its not as severe but still there    Pertinent History  S/p Right lumpectomy by Dr. Brantley Stage on 10/03/18 with SLNB x 5 nodes all negative. T2 N0, stage Ib invasive ductal carcinoma, grade 2, with an MIB-1 of 70%. Neoadjuvant chemotherapy completed starting 05/31/18, carboplatin, docetaxel, trastuzumab, and pertuzumab.  Radiation currently.  History of DM, graves disease, HIV, HTN,  thyroid cancer/nodule    Patient Stated Goals  decrease shoulder pain    Currently in Pain?  Yes    Pain Score  6     Pain Location  Shoulder    Pain Orientation  Right    Pain Descriptors / Indicators  Aching    Pain Type  Chronic pain    Pain Onset  More than a month ago    Pain Frequency  Constant         [facility] PT Assessment - 03/18/19 0001      ROM / Strength   AROM / PROM / Strength  Strength      Strength   Strength Assessment Site  Shoulder    Right/Left Shoulder  Right    Right Shoulder Flexion  4-/5    Right Shoulder ABduction  4-/5    Right Shoulder Internal Rotation  4/5    Right Shoulder External Rotation  4-/5  [place] [facility] Adult PT Treatment/Exercise - 03/18/19 0001      Shoulder Exercises: Supine   Other Supine Exercises  punch x 10      Shoulder Exercises: Isometric Strengthening   Flexion  3X5"    Flexion Limitations  into therapist hand    External Rotation  3X5"    External Rotation Limitations  into therapist hand    Internal Rotation  3X5"    Internal Rotation Limitations  into therapist hand    Other Isometric Exercises  given HEP handout for isometrics      Manual Therapy   Soft tissue mobilization  no tenderness today with seated UT work so did not continue after intial sweep    Passive ROM  to the Rt shoulder to tolerance                  PT Long Term Goals - 03/18/19 0946      PT LONG TERM GOAL #1   Title  Pt will be educated on lymphedema signs and symptoms and risk reduction strategies    Status  Achieved      PT LONG TERM GOAL #2   Title  Pt will decrease QDASH from 63% to 43%    Status  On-going      PT LONG TERM GOAL #3   Title  Pt will improve Rt shoulder AROM to flex: 150 and abd: 140    Baseline  flex: 125  abd: 135 03/18/19    Status  On-going      PT LONG TERM GOAL #4   Title  Pt will be educated on self management of shoulder pain with appropriate HEP    Status  On-going             Plan - 03/18/19 1016    Clinical Impression Statement  Pt returns today after break due to work issues.  Pt would like to continue therapy but at a decreased frequency.  pt reports being improved in the neck and tension in the UT region without tenderness or tightness here today, but still continuing with higher levels of Rt shoulder pain with movement and decreased strength globally with MMT possibly due to apprehension and pain.  Started isometrics today with only slight pain.     PT Frequency  Biweekly    PT Duration  8 weeks    PT Treatment/Interventions  ADLs/Self Care Home Management;Therapeutic exercise;Neuromuscular re-education;Patient/family education;Manual techniques;Manual lymph drainage;Passive range of motion;Taping    PT Next Visit Plan  Rt shoulder stability, ROM and postural exercises    PT Home Exercise Plan  currently has supine and standing dowel, doorway stretch, yellow band row, and UT stretch, added Access Code: [email or web address] 03/18/19 with shoulder isometrics and supine punch    Consulted and Agree with Plan of Care  Patient       Patient will benefit from skilled therapeutic intervention in order to improve the following deficits and impairments:     Visit Diagnosis: Malignant neoplasm of lower-inner quadrant of right breast of female, estrogen receptor positive ([place])  S/P lumpectomy, right breast  Other disturbances of skin sensation  Right shoulder pain, unspecified chronicity     Problem List Patient Active Problem List   Diagnosis Date Noted  . Preventative health care 09/10/2018  . Genetic testing 07/25/2018  . Port-A-Cath in place 07/12/2018  . Family history of breast cancer   . Family history of prostate cancer   . Family history of lung cancer   .  Malignant neoplasm of lower-inner quadrant of right breast of female, estrogen receptor positive ([place]) 05/10/2018  . DOE (dyspnea on exertion) 10/13/2017  . Hyperlipidemia 04/26/2017  . Chronic  anxiety 04/11/2017  . Upper airway cough syndrome 11/11/2016  . Morbid (severe) obesity due to excess calories ([place]) 10/14/2016  . Shortness of breath 10/13/2016  . Allergic rhinitis 02/18/2016  . Right shoulder pain 02/18/2016  . Type 2 diabetes mellitus ([place]) 06/23/2015  . Renal calculi 10/29/2014  . Recurrent boils 06/11/2014  . Abscess of breast, right 01/09/2013  . Hidradenitis suppurativa of right >> left axilla 01/30/2012  . 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 SARCOIDOSIS 02/08/2007  . Cigarette smoker 02/08/2007  . Human immunodeficiency virus (HIV) disease ([place]) 07/24/2006  . Depression 07/24/2006  . Essential hypertension 07/24/2006  . Asthma 07/24/2006  . GERD 07/24/2006    Stark Bray 03/18/2019, 10:19 AM  [place], [place], [address] Phone: [phone]   Fax:  [phone]  Name: [name] MRN: [id number] Date of Birth: [birth year]  PHYSICAL THERAPY DISCHARGE SUMMARY  Visits from Start of Care:3  Current functional level related to goals / functional outcomes: Pt met 1 of 3 goals but had to stop attending PT due to work schedule.  Phone call with patient 05/06/19 stating that she is doing okay overall and would call with any future issues.    Remaining deficits: Continued shoulder tightness an dpain   Education / Equipment: HEP Plan: Patient agrees to discharge.  Patient goals were partially met. Patient is being discharged due to the patient's request.  ?????    Shan Levans, PT

## 2019-03-21 ENCOUNTER — Other Ambulatory Visit: Payer: 59

## 2019-03-21 ENCOUNTER — Ambulatory Visit: Payer: 59

## 2019-03-21 NOTE — Progress Notes (Signed)
[place]  [phone] [phone]    ID: Karagan Lehr DOB: [birth year]  MR#: [id number]  ZSM#:[id number]  Patient Care Team: [name], NP as PCP - General (Internal Medicine) Michel Bickers, MD as PCP - Infectious Diseases (Infectious Diseases) Iokepa Geffre, Virgie Dad, MD as Consulting Physician (Oncology) Kyung Rudd, MD as Consulting Physician (Radiation Oncology) OTHER MD:   CHIEF COMPLAINT: Triple positive breast cancer  CURRENT TREATMENT: Adjuvant immunotherapy/adjuvant radiation   INTERVAL HISTORY: Kadelyn returns today for follow-up and treatment of her triple positive breast cancer.  She continues on adjuvant TDM1. Today is day 1 cycle 7. She has been nauseous, which she has been treating with   She also continues on tamoxifen. She has occasional hot flashes. She is not having any noticeable vaginal discharge.   Jesslyn's last echocardiogram on 03/06/2019, showed an ejection fraction of 50%.   Since her last visit here, she has not undergone any additional studies.     REVIEW OF SYSTEMS: Anagha says her energy has been coming up slowly, and she feels better than she did 3 weeks ago. 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 COVID-19 has been difficult for her because it social distancing has been difficult at her workplace; she does make sure to wear a mask and keep her personal workspace clean, however.  Not everyone at her workplace is wearing a mask she says.  She is not sleeping well. She still occasionally gets some breast pain, but she tells herself it is benign and just healing from surgery. The patient denies unusual headaches, visual changes,or dizziness.  She did have nausea for about 51 weeks she says after her last treatment.  She did not take Compazine for this.  There has been no unusual cough, phlegm production, or pleurisy. This been no change in bowel or bladder habits. The patient denies unexplained fatigue or  unexplained weight loss, bleeding, rash, or fever. A detailed review of systems was otherwise noncontributory.    HISTORY OF CURRENT ILLNESS: From the original intake note:  [name] (pronounced "Huff") palpated a mass in the right breast for about 2 weeks before bringing it to medical attention. She underwent bilateral diagnostic mammography with tomography and right breast ultrasonography at [facility] on 04/20/2018 showing: breast density category B. There was a highly suspicious mass located at 3 o'clock in the upper inner quadrant measuring 2.9 x 2.5 x 1.6 cm and 4 cm from the nipple. Ultrasound of the right axilla showed 5 lymph nodes with abnormal cortical thickening. The left axilla showed 3 lymph nodes with abnormal cortical thickening.  Accordingly on 04/26/2018 she proceeded to biopsy of the right breast area in question. The pathology from this procedure showed ([id number]): Invasive ductal carcinoma, grade II with calcifications. One right axillary lymph node and one left axillary lymph node were negative for carcinoma. Prognostic indicators significant for: estrogen receptor, 100% positive and progesterone receptor, 80% positive, both with strong staining intensity. Proliferation marker Ki67 at 70%. HER2 amplified with ratios HER2/CEP17 signals 4.52 and average HER2 copies per cell 10.85  The patient's subsequent history is as detailed below.  PAST MEDICAL HISTORY: Past Medical History:  Diagnosis Date  . Allergy   . Anemia    Normocytic  . Anxiety   . Asthma   . Blood dyscrasia   . Bronchitis 2005  . 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 Diabetes mellitus without complication ([place])   . Family history of breast cancer   .  Family history of lung cancer   . Family history of prostate cancer   . Gastroenteritis 07/10/07  . GERD 07/24/2006  . GRAVE'S DISEASE 01/01/2008  . History of hidradenitis suppurativa   . History of kidney stones   . History of  thrush   . HIV DISEASE 07/24/2006   dx March 05  . Hyperlipidemia   . HYPERTENSION 07/24/2006  . Hyperthyroidism 08/2006   Grave's Disease -diffuse radiotracer uptake 08/25/06 Thyroid scan-Cold nodule to R lower lobe of thyrorid  . Menometrorrhagia    hx of  . Nephrolithiasis   . 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 Pneumonia 2005  . 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 Sarcoidosis 02/08/2007   dx as a teenager in [place] from abnl CXR. Completed 2 yrs Prednisone after lung bx confirmation. No symptoms since then.  . Suppurative hidradenitis   . Thyroid cancer ([place])   . THYROID NODULE, RIGHT 02/08/2007  -2018 thyroid nodules were precancerous but pathology was negative for thyroid cancer.   PAST SURGICAL HISTORY: Past Surgical History:  Procedure Laterality Date  . BREAST LUMPECTOMY WITH RADIOACTIVE SEED AND SENTINEL LYMPH NODE BIOPSY Right 10/03/2018   Procedure: RIGHT BREAST LUMPECTOMY WITH RADIOACTIVE SEED AND SENTINEL LYMPH NODE MAPPING;  Surgeon: Erroll Luna, MD;  Location: [facility];  Service: General;  Laterality: Right;  . BREAST SURGERY  1997   Breast Reduction   . CYSTOSCOPY W/ URETERAL STENT REMOVAL  11/09/2012   Procedure: CYSTOSCOPY WITH STENT REMOVAL;  Surgeon: Alexis Frock, MD;  Location: [facility];  Service: Urology;  Laterality: Right;  . CYSTOSCOPY WITH RETROGRADE PYELOGRAM, URETEROSCOPY AND STENT PLACEMENT  11/09/2012   Procedure: CYSTOSCOPY WITH RETROGRADE PYELOGRAM, URETEROSCOPY AND STENT PLACEMENT;  Surgeon: Alexis Frock, MD;  Location[facility];  Service: Urology;  Laterality: Left;  LEFT URETEROSCOPY, STONE MANIPULATION, left STENT exchange   . CYSTOSCOPY WITH STENT PLACEMENT  10/02/2012   Procedure: CYSTOSCOPY WITH STENT PLACEMENT;  Surgeon: Alexis Frock, MD;  Location[facility];  Service: Urology;  Laterality: Left;  . DILATION AND CURETTAGE OF UTERUS  Feb 2004   s/p for 1st trimester  nonviable pregnancy  . EYE SURGERY     sty under eyelid  . INCISE AND DRAIN ABCESS  Nov 03   s/p I &D for righ inframmary fold hidradenitis  . INCISION AND DRAINAGE PERITONSILLAR ABSCESS  Mar 03  . IR CV LINE INJECTION  06/07/2018  . IR IMAGING GUIDED PORT INSERTION  06/20/2018  . IR REMOVAL TUN ACCESS W/ PORT W/O FL MOD SED  06/20/2018  . IRRIGATION AND DEBRIDEMENT ABSCESS  01/31/2012   Procedure: IRRIGATION AND DEBRIDEMENT ABSCESS;  Surgeon: Shann Medal, MD;  Location[facility];  Service: General;  Laterality: Right;  right breast and axilla   . NEPHROLITHOTOMY  10/02/2012   Procedure: NEPHROLITHOTOMY PERCUTANEOUS;  Surgeon: Alexis Frock, MD;  [facility];  Service: Urology;  Laterality: Right;  First Stage Percutaneous Nephrolithotomy with Surgeon Access, Left Ureteral Stent    . NEPHROLITHOTOMY  10/04/2012   Procedure: NEPHROLITHOTOMY PERCUTANEOUS SECOND LOOK;  Surgeon: Alexis Frock, MD;  [facility];  Service: Urology;  Laterality: Right;     . NEPHROLITHOTOMY  10/08/2012   Procedure: NEPHROLITHOTOMY PERCUTANEOUS;  Surgeon: Alexis Frock, MD;  [facility];  Service: Urology;  Laterality: Right;  THIRD STAGE, nephrostomy tube exchange x 2  . NEPHROLITHOTOMY  10/11/2012   Procedure: NEPHROLITHOTOMY PERCUTANEOUS SECOND LOOK;  Surgeon: Alexis Frock, MD;  [facility];  Service: Urology;  Laterality: Right;  RIGHT 4 STAGE PERCUTANOUS NEPHROLITHOTOMY, right URETEROSCOPY WITH HOLMIUM LASER   . PORTACATH PLACEMENT Left 05/17/2018   Procedure: INSERTION PORT-A-CATH;  Surgeon: Coralie Keens, MD;  Location: [facility];  Service: General;  Laterality: Left;  . RADICAL NECK DISSECTION  04/12/2017   limited/notes 04/12/2017  . RADICAL NECK DISSECTION N/A 04/12/2017   Procedure: RADICAL NECK DISSECTION;  Surgeon: Melida Quitter, MD;  Location: [facility];  Service: ENT;  Laterality: N/A;  limited neck dissection 2 hours total  . REDUCTION MAMMAPLASTY Bilateral 1998  .  [place]  1994  . THYROIDECTOMY  04/12/2017   completion/notes 04/12/2017  . THYROIDECTOMY N/A 04/12/2017   Procedure: THYROIDECTOMY;  Surgeon: Melida Quitter, MD;  Location: [facility];  Service: ENT;  Laterality: N/A;  Completion Thyroidectomy  . TOTAL THYROIDECTOMY  2010  Thyroid nodules removed- pathology at the ENT doctor 2018 were benign -Over active thyroid and thyroidectomy with benign pathology  FAMILY HISTORY Family History  Problem Relation Age of Onset  . Hypertension Mother   . Cancer Mother        laryngeal  . Heart disease Mother        stent  . Hypertension Father   . Lung cancer Father 23       hx smoking  . Heart disease Other   . Hypertension Other   . Stroke Other        Grandparent  . Kidney disease Other        Grandparent  . Diabetes Other        FH of Diabetes  . Hypertension Sister   . Cancer Maternal Uncle        Lung CA  . Hypertension Brother   . Hypertension Sister   . Breast cancer Maternal Aunt 65  . Breast cancer Paternal Aunt 92  . Prostate cancer Paternal Uncle   . Breast cancer Maternal Aunt        dx 60+  . Breast cancer Paternal Aunt        dx 78's  . Breast cancer Paternal Aunt        dx 52's  . Prostate cancer Paternal Uncle   . Lung cancer Paternal Uncle   . Breast cancer Cousin 58  . Breast cancer Cousin        dx <50  . Breast cancer Cousin        dx <50  . Breast cancer Cousin        dx <50  . Colon cancer Neg Hx   . Esophageal cancer Neg Hx   . Rectal cancer Neg Hx   . Stomach cancer Neg Hx   The patient's mother is alive at age 98. The patient's father died at 50 from lung cancer (heavy smoker). She does not know much about her father. The patient has 1 brother and 2 sisters. The patient's mother had a history of laryngeal cancer. There was a maternal cousin with breast cancer diagnosed at age 65. There were 2 maternal aunts with breast cancer, the youngest being diagnosed in the 5's. There was a maternal great aunt with breast  cancer. There were 2 paternal aunts with breast cancer.    GYNECOLOGIC HISTORY:  Patient's last menstrual period was 03/31/2014. Menarche: 53 years old Age at first live birth: 53 years old She is GX P1.  Her LMP was in 2017. She did not use HRT.    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 At home is the patient's daughter, Verdie Drown, age 67, who is a Engineer, technical sales education and working part-time at the [employer]. The patient attends [employer].   ADVANCED DIRECTIVES: Not in place; at the 05/10/2018 visit the patient was given the appropriate documents to complete and notarized at her discretion   HEALTH MAINTENANCE: Social History   Tobacco Use  . Smoking status: Former Smoker    Packs/day: 0.50    Years: 15.00    Pack years: 7.50    Types: Cigarettes    Start date: 04/12/2017  . Smokeless tobacco: Never Used  . Tobacco comment: quit smoking  may 2018  Substance Use Topics  . Alcohol use: Yes    Alcohol/week: 0.0 standard drinks    Comment: social  . Drug use: No     Colonoscopy: no  PAP: [facility]. 2018  Bone density: remote   Allergies  Allergen Reactions  . Genvoya [Elviteg-Cobic-Emtricit-Tenofaf] Hives  . Lisinopril Cough  . Valsartan Other (See Comments) and Cough    Pt states she tolerates medicine now    Current Outpatient Medications  Medication Sig Dispense Refill  . amLODipine (NORVASC) 10 MG tablet Take 1 tablet (10 mg total) by mouth daily. For blood pressure. 90 tablet 3  . atorvastatin (LIPITOR) 20 MG tablet Take 1 tablet by mouth every evening for high cholesterol. NEED APPOINTMENT FOR ANY MORE REFILLS (Patient taking differently: Take 20 mg by mouth every evening. ) 30 tablet 0  . dolutegravir ([name]) 50 MG tablet Take 1 tablet (50 mg total) by mouth daily. 30 tablet 11  . doxycycline (VIBRA-TABS) 100 MG tablet 100 mg daily.     Marland Kitchen emtricitabine-tenofovir AF (DESCOVY) 200-25 MG tablet  Take 1 tablet by mouth daily. 30 tablet 11  . fluconazole (DIFLUCAN) 100 MG tablet 2 tablets for first dose then 1 tab a day x 2 days 4 tablet 0  . gabapentin (NEURONTIN) 300 MG capsule Take 1 capsule (300 mg total) by mouth 3 (three) times daily. 90 capsule 0  . irbesartan-hydrochlorothiazide (AVALIDE) 150-12.5 MG tablet TAKE 1 TABLET BY MOUTH ONCE DAILY FOR BLOOD PRESSURE. 90 tablet 1  . levothyroxine (SYNTHROID, LEVOTHROID) 175 MCG tablet TAKE 1 TABLET BY MOUTH EVERY MORNING ON AN EMPTY STOMACH WITH A FULL GLASS OF WATER. (Patient taking differently: Take 175 mcg by mouth daily before breakfast. ) 30 tablet 5  . metFORMIN (GLUCOPHAGE) 500 MG tablet Take 1 tablet (500 mg total) by mouth 2 (two) times daily with a meal. 180 tablet 3  . metoprolol succinate (TOPROL-XL) 50 MG 24 hr tablet TAKE 1 TABLET (50 MG TOTAL) BY MOUTH DAILY. TAKE WITH OR IMMEDIATELY FOLLOWING A MEAL. 90 tablet 1  . mometasone (NASONEX) 50 MCG/ACT nasal spray PLACE 2 SPRAYS INTO THE NOSE DAILY. 17 g 3  . Multiple Vitamin (MULTIVITAMIN) tablet Take 1 tablet by mouth daily.    . ONE TOUCH LANCETS MISC Use as instructed to test blood sugar daily 200 each 5  . ONETOUCH VERIO test strip USE AS INSTRUCTED TO TEST BLOOD SUGAR DAILY NEED APPOINTMENT FOR ANY MORE REFILLS 50 each 0  . potassium chloride (K-DUR) 10 MEQ tablet Take 2 tablets (20 mEq total) by mouth 2 (two) times daily. 20 tablet 0  . PROAIR RESPICLICK 220 (90 Base) MCG/ACT AEPB INHALE 2 PUFFS INTO THE LUNGS EVERY 6 HOURS AS NEEDED FOR WHEEZING OR SHORTNESS OF BREATH. 1 each 2  . spironolactone (ALDACTONE)  25 MG tablet Take 1 tablet (25 mg total) by mouth 2 (two) times daily. 60 tablet 3  . tamoxifen (NOLVADEX) 20 MG tablet Take 1 tablet (20 mg total) by mouth daily. 30 tablet 5   No current facility-administered medications for this visit.    Facility-Administered Medications Ordered in Other Visits  Medication Dose Route Frequency Provider Last Rate Last Dose  . sodium  chloride flush (NS) 0.9 % injection 10 mL  10 mL Intracatheter PRN Itxel Wickard, Virgie Dad, MD   10 mL at 09/19/18 1551    OBJECTIVE: Middle-aged African-American woman who appears stated age 53:   03/22/19 0819  BP: (!) 147/79  Pulse: 78  Resp: 17  Temp: 97.8 F (36.6 C)  SpO2: 100%     Body mass index is 39.22 kg/m.   Wt Readings from Last 3 Encounters:  03/22/19 235 lb 11.2 oz (106.9 kg)  02/28/19 235 lb 6.4 oz (106.8 kg)  02/07/19 232 lb (105.2 kg)  ECOG FS:1 - Symptomatic but completely ambulatory  Sclerae unicteric, EOMs intact Wearing a mask No cervical or supraclavicular adenopathy Lungs no rales or rhonchi Heart regular rate and rhythm Abd soft, nontender, positive bowel sounds MSK no focal spinal tenderness, no upper extremity lymphedema Neuro: nonfocal, well oriented, appropriate affect Breasts: Right breast is status post lumpectomy and adjuvant radiation.  It is significantly hyperpigmented.  There is no evidence of disease recurrence.  The left breast is benign.  The patient does have a history of hidradenitis bilaterally but this is well controlled at present, with no active lesions seen.  LAB RESULTS:  CMP     Component Value Date/Time   NA 138 03/22/2019 0758   K 3.5 03/22/2019 0758   CL 101 03/22/2019 0758   CO2 24 03/22/2019 0758   GLUCOSE 249 (H) 03/22/2019 0758   BUN 23 (H) 03/22/2019 0758   CREATININE 1.71 (H) 03/22/2019 0758   CREATININE 1.28 (H) 05/10/2018 1458   CREATININE 1.02 09/23/2015 1047   CALCIUM 8.5 (L) 03/22/2019 0758   PROT 7.9 03/22/2019 0758   ALBUMIN 2.8 (L) 03/22/2019 0758   AST 40 03/22/2019 0758   AST 28 05/10/2018 1458   ALT 27 03/22/2019 0758   ALT 23 05/10/2018 1458   ALKPHOS 91 03/22/2019 0758   BILITOT <0.2 (L) 03/22/2019 0758   BILITOT <0.2 (L) 05/10/2018 1458   GFRNONAA 34 (L) 03/22/2019 0758   GFRNONAA 48 (L) 05/10/2018 1458   GFRAA 40 (L) 03/22/2019 0758   GFRAA 56 (L) 05/10/2018 1458    No results found for:  TOTALPROTELP, ALBUMINELP, A1GS, A2GS, BETS, BETA2SER, GAMS, MSPIKE, SPEI  No results found for: KPAFRELGTCHN, LAMBDASER, KAPLAMBRATIO  Lab Results  Component Value Date   WBC 6.8 03/22/2019   NEUTROABS 4.7 03/22/2019   HGB 11.5 (L) 03/22/2019   HCT 35.4 (L) 03/22/2019   MCV 91.9 03/22/2019   PLT 227 03/22/2019    '@LASTCHEMISTRY'$ @  No results found for: LABCA2  No components found for: [id number]  No results for input(s): INR in the last 168 hours.  No results found for: LABCA2  No results found for: JKK938  No results found for: HWE993  No results found for: ZJI967  No results found for: CA2729  No components found for: HGQUANT  No results found for: CEA1 / No results found for: CEA1   No results found for: AFPTUMOR  No results found for: CHROMOGRNA  No results found for: PSA1  Appointment on 03/22/2019  Component Date Value Ref  Range Status  . Sodium 03/22/2019 138  135 - 145 mmol/L Final  . Potassium 03/22/2019 3.5  3.5 - 5.1 mmol/L Final  . Chloride 03/22/2019 101  98 - 111 mmol/L Final  . CO2 03/22/2019 24  22 - 32 mmol/L Final  . Glucose, Bld 03/22/2019 249* 70 - 99 mg/dL Final  . BUN 03/22/2019 23* 6 - 20 mg/dL Final  . Creatinine, Ser 03/22/2019 1.71* 0.44 - 1.00 mg/dL Final  . Calcium 03/22/2019 8.5* 8.9 - 10.3 mg/dL Final  . Total Protein 03/22/2019 7.9  6.5 - 8.1 g/dL Final  . Albumin 03/22/2019 2.8* 3.5 - 5.0 g/dL Final  . AST 03/22/2019 40  15 - 41 U/L Final  . ALT 03/22/2019 27  0 - 44 U/L Final  . Alkaline Phosphatase 03/22/2019 91  38 - 126 U/L Final  . Total Bilirubin 03/22/2019 <0.2* 0.3 - 1.2 mg/dL Final  . GFR calc non Af Amer 03/22/2019 34* >60 mL/min Final  . GFR calc Af Amer 03/22/2019 40* >60 mL/min Final  . Anion gap 03/22/2019 13  5 - 15 Final   Performed at [facility], [place] [address], [place], [place] [address]  . WBC 03/22/2019 6.8  4.0 - 10.5 K/uL Final  . RBC 03/22/2019 3.85* 3.87 - 5.11 MIL/uL Final   . Hemoglobin 03/22/2019 11.5* 12.0 - 15.0 g/dL Final  . HCT 03/22/2019 35.4* 36.0 - 46.0 % Final  . MCV 03/22/2019 91.9  80.0 - 100.0 fL Final  . MCH 03/22/2019 29.9  26.0 - 34.0 pg Final  . MCHC 03/22/2019 32.5  30.0 - 36.0 g/dL Final  . RDW 03/22/2019 18.3* 11.5 - 15.5 % Final  . Platelets 03/22/2019 227  150 - 400 K/uL Final  . nRBC 03/22/2019 0.0  0.0 - 0.2 % Final  . Neutrophils Relative % 03/22/2019 68  % Final  . Neutro Abs 03/22/2019 4.7  1.7 - 7.7 K/uL Final  . Lymphocytes Relative 03/22/2019 21  % Final  . Lymphs Abs 03/22/2019 1.4  0.7 - 4.0 K/uL Final  . Monocytes Relative 03/22/2019 7  % Final  . Monocytes Absolute 03/22/2019 0.5  0.1 - 1.0 K/uL Final  . Eosinophils Relative 03/22/2019 3  % Final  . Eosinophils Absolute 03/22/2019 0.2  0.0 - 0.5 K/uL Final  . Basophils Relative 03/22/2019 0  % Final  . Basophils Absolute 03/22/2019 0.0  0.0 - 0.1 K/uL Final  . Immature Granulocytes 03/22/2019 1  % Final  . Abs Immature Granulocytes 03/22/2019 0.04  0.00 - 0.07 K/uL Final   Performed at [facility], [address] [place], [place] [address]    (this displays the last labs from the last 3 days)  No results found for: TOTALPROTELP, ALBUMINELP, A1GS, A2GS, BETS, BETA2SER, GAMS, MSPIKE, SPEI (this displays SPEP labs)  No results found for: KPAFRELGTCHN, LAMBDASER, KAPLAMBRATIO (kappa/lambda light chains)  No results found for: HGBA, HGBA2QUANT, HGBFQUANT, HGBSQUAN (Hemoglobinopathy evaluation)   No results found for: LDH  Lab Results  Component Value Date   IRON 26 (L) 02/08/2007   TIBC 371 02/08/2007   IRONPCTSAT 7 (L) 02/08/2007   (Iron and TIBC)  No results found for: FERRITIN  Urinalysis    Component Value Date/Time   COLORURINE YELLOW 10/21/2014 2347   APPEARANCEUR CLOUDY (A) 10/21/2014 2347   LABSPEC 1.014 10/21/2014 2347   PHURINE 6.0 10/21/2014 2347   GLUCOSEU NEGATIVE 10/21/2014 2347   GLUCOSEU NEG mg/dL 01/23/2008 1418  HGBUR LARGE (A) 10/21/2014 2347   BILIRUBINUR negative 12/29/2017 1046   KETONESUR NEGATIVE 10/21/2014 2347   PROTEINUR 2+ 12/29/2017 1046   PROTEINUR 100 (A) 10/21/2014 2347   UROBILINOGEN 0.2 12/29/2017 1046   UROBILINOGEN 0.2 10/21/2014 2347   NITRITE negative 12/29/2017 1046   NITRITE NEGATIVE 10/21/2014 2347   LEUKOCYTESUR Large (3+) (A) 12/29/2017 1046     STUDIES: No results found.   ELIGIBLE FOR AVAILABLE RESEARCH PROTOCOL: UPBEAT   ASSESSMENT: 53 y.o. [place], [place] woman status post right breast upper inner quadrant biopsy 04/26/2018 for a clinical T2 N0, stage Ib invasive ductal carcinoma, grade 2, ER/PR positive, HER-2 positive with an MIB-1 of 70%.  (1) genetics testing 07/03/2018: no pathogenic mutations. Genes Analyzed (43 total): APC*, ATM*, AXIN2, BARD1, BMPR1A, BRCA1*, BRCA2*, BRIP1*, CDH1*, CDK4, CDKN2A, CHEK2*, DICER1,GALNT12, HOXB13, MEN1, MLH1*, MRE11A, MSH2*, MSH3, MSH6*, MUTYH*, NBN, NF1*, NTHL1, PALB2*, PMS2*, POLD1, POLE,PTEN*, RAD50, RAD51C*, RAD51D*, RET, SDHB, SDHD, SMAD4, SMARCA4, STK11 and TP53* (sequencing and deletion/duplication); EGFR (sequencing only); EPCAM and GREM1 (deletion/duplication only). DNA and RNA analyses performed for * genes.  (2) neoadjuvant chemotherapy consisting of carboplatin, docetaxel, trastuzumab and Pertuzumab given every 21 days x 6, starting 05/31/2018, completed 09/14/2018  (a) Pertuzumab discontinued starting with cycle 2 because of diarrhea problems  (b) Taxotere stopped after 4 cycles due peripheral neuropathy and substituted for last two cycles with Gemcitabine  (3)  definitive surgery on 10/03/2018 showed residual ypT1c ypN0 invasive ductal carcinoma, grade 2, with negative margins; a total of 5 sentinel lymph nodes removed; repeat prognostic panel again triple positive  (4) T-DM1 given adjuvantly due to residual disease: started 11/13/2018.  (a) echo on 05/16/2018 shows an EF of 60-65%.  (b) echo on 08/27/18 shows an  EF 55-60%  (c) echo on 12/03/2018 shows an EF of 60-65%  (d) echocardiogram on 03/06/2019 showed an ejection fraction of 55% as read by Dr. Haroldine Laws.  (5) adjuvant radiation completed 02/21/2019 (delayed due to wound healing issues)   (6) Tamoxifen started 02/28/2019  (7) hidradenitis/boils: on doxycycline daily   PLAN: Ladrea had a minor drop in her ejection fraction in the most recent echocardiogram, but this was reviewed by cardio oncology and they feel there is no evidence of trastuzumab toxicity.  They will repeat an echocardiogram in 3 months.  [name] has been able to continue to work right through her treatment which is a major achievement.  There is a little bit of laxity at her job in terms of wearing masks.  It may not be possible there to maintain adequate social distancing.  I wrote a letter for her to take to her supervisor to see if I believes they can work on the mask issue.  This with course would be good not only for her but for all the other workers there  We reviewed her treatment record and she does not need to continue the anti-HER-2 treatments through December as she thought.  She will finish late August.  I had originally planned to switch her back to trastuzumab after 8 doses of T-DM1 but I think she is tolerating this well enough that we can simply continue on TDM 1 until she completes her treatments.  She is tolerating tamoxifen well and the plan will be to continue that a minimum of 5 years  I suggested she try Compazine for nausea if she does develop nausea again with today's treatment.  We are going to be seeing her with the next few treatments just to make sure  all continues well  In September she will have repeat mammography  She knows to call for any other issue that may develop before the next visit.   Mccoy Testa, Virgie Dad, MD  03/22/19 8:57 AM Medical Oncology and Hematology [facility] [address] [place], [place] [address] Tel.  [phone]    Fax. [phone]  I, Jacqualyn Posey am acting as a Education administrator for Chauncey Cruel, MD.   I, Lurline Del MD, have reviewed the above documentation for accuracy and completeness, and I agree with the above.

## 2019-03-22 ENCOUNTER — Inpatient Hospital Stay: Payer: 59 | Attending: Oncology

## 2019-03-22 ENCOUNTER — Inpatient Hospital Stay: Payer: 59

## 2019-03-22 ENCOUNTER — Encounter: Payer: Self-pay | Admitting: Oncology

## 2019-03-22 ENCOUNTER — Inpatient Hospital Stay (HOSPITAL_BASED_OUTPATIENT_CLINIC_OR_DEPARTMENT_OTHER): Payer: 59 | Admitting: Oncology

## 2019-03-22 ENCOUNTER — Other Ambulatory Visit: Payer: Self-pay

## 2019-03-22 ENCOUNTER — Telehealth: Payer: Self-pay | Admitting: *Deleted

## 2019-03-22 VITALS — BP 147/79 | HR 78 | Temp 97.8°F | Resp 17 | Ht 65.0 in | Wt 235.7 lb

## 2019-03-22 VITALS — BP 145/82 | HR 74 | Temp 98.3°F

## 2019-03-22 DIAGNOSIS — Z95828 Presence of other vascular implants and grafts: Secondary | ICD-10-CM

## 2019-03-22 DIAGNOSIS — B20 Human immunodeficiency virus [HIV] disease: Secondary | ICD-10-CM

## 2019-03-22 DIAGNOSIS — L732 Hidradenitis suppurativa: Secondary | ICD-10-CM | POA: Insufficient documentation

## 2019-03-22 DIAGNOSIS — C50211 Malignant neoplasm of upper-inner quadrant of right female breast: Secondary | ICD-10-CM | POA: Diagnosis present

## 2019-03-22 DIAGNOSIS — Z170 Estrogen receptor positive status [ER+]: Secondary | ICD-10-CM

## 2019-03-22 DIAGNOSIS — C50311 Malignant neoplasm of lower-inner quadrant of right female breast: Secondary | ICD-10-CM

## 2019-03-22 DIAGNOSIS — C73 Malignant neoplasm of thyroid gland: Secondary | ICD-10-CM

## 2019-03-22 DIAGNOSIS — E119 Type 2 diabetes mellitus without complications: Secondary | ICD-10-CM

## 2019-03-22 DIAGNOSIS — E109 Type 1 diabetes mellitus without complications: Secondary | ICD-10-CM | POA: Insufficient documentation

## 2019-03-22 DIAGNOSIS — Z79810 Long term (current) use of selective estrogen receptor modulators (SERMs): Secondary | ICD-10-CM

## 2019-03-22 DIAGNOSIS — Z79899 Other long term (current) drug therapy: Secondary | ICD-10-CM | POA: Insufficient documentation

## 2019-03-22 DIAGNOSIS — Z5112 Encounter for antineoplastic immunotherapy: Secondary | ICD-10-CM | POA: Diagnosis not present

## 2019-03-22 LAB — COMPREHENSIVE METABOLIC PANEL
ALT: 27 U/L (ref 0–44)
AST: 40 U/L (ref 15–41)
Albumin: 2.8 g/dL — ABNORMAL LOW (ref 3.5–5.0)
Alkaline Phosphatase: 91 U/L (ref 38–126)
Anion gap: 13 (ref 5–15)
BUN: 23 mg/dL — ABNORMAL HIGH (ref 6–20)
CO2: 24 mmol/L (ref 22–32)
Calcium: 8.5 mg/dL — ABNORMAL LOW (ref 8.9–10.3)
Chloride: 101 mmol/L (ref 98–111)
Creatinine, Ser: 1.71 mg/dL — ABNORMAL HIGH (ref 0.44–1.00)
GFR calc Af Amer: 40 mL/min — ABNORMAL LOW (ref >60)
GFR calc non Af Amer: 34 mL/min — ABNORMAL LOW (ref >60)
Glucose, Bld: 249 mg/dL — ABNORMAL HIGH (ref 70–99)
Potassium: 3.5 mmol/L (ref 3.5–5.1)
Sodium: 138 mmol/L (ref 135–145)
Total Bilirubin: <0.2 mg/dL — ABNORMAL LOW (ref 0.3–1.2)
Total Protein: 7.9 g/dL (ref 6.5–8.1)

## 2019-03-22 LAB — CBC WITH DIFFERENTIAL/PLATELET
Abs Immature Granulocytes: 0.04 10*3/uL (ref 0.00–0.07)
Basophils Absolute: 0 10*3/uL (ref 0.0–0.1)
Basophils Relative: 0 %
Eosinophils Absolute: 0.2 10*3/uL (ref 0.0–0.5)
Eosinophils Relative: 3 %
HCT: 35.4 % — ABNORMAL LOW (ref 36.0–46.0)
Hemoglobin: 11.5 g/dL — ABNORMAL LOW (ref 12.0–15.0)
Immature Granulocytes: 1 %
Lymphocytes Relative: 21 %
Lymphs Abs: 1.4 10*3/uL (ref 0.7–4.0)
MCH: 29.9 pg (ref 26.0–34.0)
MCHC: 32.5 g/dL (ref 30.0–36.0)
MCV: 91.9 fL (ref 80.0–100.0)
Monocytes Absolute: 0.5 10*3/uL (ref 0.1–1.0)
Monocytes Relative: 7 %
Neutro Abs: 4.7 10*3/uL (ref 1.7–7.7)
Neutrophils Relative %: 68 %
Platelets: 227 10*3/uL (ref 150–400)
RBC: 3.85 MIL/uL — ABNORMAL LOW (ref 3.87–5.11)
RDW: 18.3 % — ABNORMAL HIGH (ref 11.5–15.5)
WBC: 6.8 10*3/uL (ref 4.0–10.5)
nRBC: 0 % (ref 0.0–0.2)

## 2019-03-22 MED ORDER — ACETAMINOPHEN 325 MG PO TABS
650.0000 mg | ORAL_TABLET | Freq: Once | ORAL | Status: AC
  Administered 2019-03-22: 650 mg via ORAL

## 2019-03-22 MED ORDER — SODIUM CHLORIDE 0.9 % IV SOLN
Freq: Once | INTRAVENOUS | Status: DC
  Filled 2019-03-22: qty 250

## 2019-03-22 MED ORDER — SODIUM CHLORIDE 0.9 % IV SOLN
360.0000 mg | Freq: Once | INTRAVENOUS | Status: AC
  Administered 2019-03-22: 360 mg via INTRAVENOUS
  Filled 2019-03-22: qty 8

## 2019-03-22 MED ORDER — SODIUM CHLORIDE 0.9% FLUSH
10.0000 mL | INTRAVENOUS | Status: DC | PRN
  Administered 2019-03-22: 10 mL
  Filled 2019-03-22: qty 10

## 2019-03-22 MED ORDER — SODIUM CHLORIDE 0.9 % IV SOLN
INTRAVENOUS | Status: AC
  Administered 2019-03-22: 09:00:00 via INTRAVENOUS
  Filled 2019-03-22 (×2): qty 250

## 2019-03-22 MED ORDER — ACETAMINOPHEN 325 MG PO TABS
ORAL_TABLET | ORAL | Status: AC
  Filled 2019-03-22: qty 2

## 2019-03-22 MED ORDER — HEPARIN SOD (PORK) LOCK FLUSH 100 UNIT/ML IV SOLN
500.0000 [IU] | Freq: Once | INTRAVENOUS | Status: AC | PRN
  Administered 2019-03-22: 500 [IU]
  Filled 2019-03-22: qty 5

## 2019-03-22 MED ORDER — DIPHENHYDRAMINE HCL 25 MG PO CAPS
50.0000 mg | ORAL_CAPSULE | Freq: Once | ORAL | Status: AC
  Administered 2019-03-22: 50 mg via ORAL

## 2019-03-22 MED ORDER — DIPHENHYDRAMINE HCL 25 MG PO CAPS
ORAL_CAPSULE | ORAL | Status: AC
  Filled 2019-03-22: qty 2

## 2019-03-22 NOTE — Progress Notes (Signed)
Pt's glucose is 249 this a.m., pt states she drank two glasses of orange juice this morning, is not on insulin.  Creatnine 1.71   Felicia Lenis RN informed.    IV fluids added to treatment plan.  Pt refused to stay for 30 minutes post kadcyla. VS stable.

## 2019-03-22 NOTE — Telephone Encounter (Signed)
Call received from treatment room nurse for ok to treat with creatinine of 1.7 and noted glucose of 249.  Per MD review- ok to treat and give an additional 500 cc NS with treatment today.  Above called to treatment room nurse.

## 2019-03-25 ENCOUNTER — Telehealth: Payer: Self-pay | Admitting: Oncology

## 2019-03-25 NOTE — Telephone Encounter (Signed)
Talk with patient regarding schedule °

## 2019-03-26 ENCOUNTER — Other Ambulatory Visit: Payer: Self-pay | Admitting: Primary Care

## 2019-03-26 ENCOUNTER — Telehealth: Payer: Self-pay | Admitting: Radiation Oncology

## 2019-03-26 DIAGNOSIS — E890 Postprocedural hypothyroidism: Secondary | ICD-10-CM

## 2019-03-26 NOTE — Progress Notes (Signed)
  Radiation Oncology         [phone] ________________________________  Name: [name] MRN: [id number]  Date: 02/21/2019  DOB: Jun 01, 1966  End of Treatment Note  Diagnosis:   53 y.o. 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 This was delivered using a 3-D conformal technique. The patient then received a boost to the seroma. This delivered an additional 10 Gy in 5 fractions using 12e electrons with a special teletherapy technique. The total dose was 60.4 Gy.  Narrative: The patient tolerated radiation treatment relatively well.   The patient had some expected skin irritation as she progressed during treatment. Moist desquamation was not present at the end of treatment. She is applying Radiaplex gel to her skin as directed. She also noted some pain underneath her arm and increased fatigue.  Plan: The patient has completed radiation treatment. The patient will return to radiation oncology clinic for routine followup in one month. I advised the patient to call or return sooner if they have any questions or concerns related to their recovery or treatment. ________________________________  Jodelle Gross, MD, PhD  This document serves as a record of services personally performed by Kyung Rudd, MD. It was created on his behalf by Rae Lips, a trained medical scribe. The creation of this record is based on the scribe's personal observations and the provider's statements to them. This document has been checked and approved by the attending provider.

## 2019-03-26 NOTE — Telephone Encounter (Signed)
  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   Narrative:  The patient was contacted today for routine follow-up. During treatment she did very well with radiotherapy and did not have significant desquamation. She reports she is doing well.  Impression/Plan: 1.  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 The patient has been doing well since completion of radiotherapy. We discussed that we would be happy to continue to follow her as needed, but she will also continue to follow up with Dr. [name] in medical oncology. She was counseled on skin care as well as measures to avoid sun exposure to this area.  2. Survivorship. We discussed the importance of survivorship evaluation and was given the phone number for Ottis Stain [phone]) to be added to the list to receive the monthly resource calendar for the cancer center.     Carola Rhine, PAC

## 2019-04-11 ENCOUNTER — Ambulatory Visit: Payer: 59 | Admitting: Adult Health

## 2019-04-11 ENCOUNTER — Other Ambulatory Visit: Payer: 59

## 2019-04-11 ENCOUNTER — Ambulatory Visit: Payer: 59

## 2019-04-11 NOTE — Progress Notes (Signed)
[name]  Telephone:([phone]) [phone] Fax:(336) [phone]    ID: [name] DOB: 10/28/1965  MR#: [id number]  JJH#:[id number]  Patient Care Team: [name], NP as PCP - General (Internal Medicine) Michel Bickers, MD as PCP - Infectious Diseases (Infectious Diseases) Reinhardt Licausi, Virgie Dad, MD as Consulting Physician (Oncology) Kyung Rudd, MD as Consulting Physician (Radiation Oncology) OTHER MD:   CHIEF COMPLAINT: Triple positive breast cancer  CURRENT TREATMENT: Adjuvant immunotherapy/adjuvant radiation   INTERVAL HISTORY: [name] returns today for follow-up and treatment of her triple positive breast cancer.  She continues on adjuvant TDM1. Today is day 1 cycle 8. She tolerates this well and without any noticeable side effects.    She also continues on tamoxifen. She does not some hot flashes  Channelle's last echocardiogram on 03/06/2019, showed an ejection fraction of 50%.   Since her last visit here, she has not undergone any additional studies.     REVIEW OF SYSTEMS: Madgeline continues to work. She has been trying to monitor the people around her around mask wearing, but many people in her office are not following appropriate social distancing or mask guidelines. She notes some insomnia, without difficult from anxiety. She is having increased urination however, both during the day and at night. She checks her blood sugar twice per day, and it has not been over 200. She is taking HCTZ and she is also drinking a lot of water. She does have some "ouchies" in her breast at the surgical site, which she knows is benign. The patient denies unusual headaches, visual changes, nausea, vomiting, or dizziness. There has been no unusual cough, phlegm production, or pleurisy. This been no change in bowel or bladder habits. The patient denies unexplained fatigue or unexplained weight loss, bleeding, rash, or fever. A detailed review of systems was otherwise  noncontributory.    HISTORY OF CURRENT ILLNESS: From the original intake note:  [name] (pronounced "Huff") palpated a mass in the right breast for about 2 weeks before bringing it to medical attention. She underwent bilateral diagnostic mammography with tomography and right breast ultrasonography at [facility] on 04/20/2018 showing: breast density category B. There was a highly suspicious mass located at 3 o'clock in the upper inner quadrant measuring 2.9 x 2.5 x 1.6 cm and 4 cm from the nipple. Ultrasound of the right axilla showed 5 lymph nodes with abnormal cortical thickening. The left axilla showed 3 lymph nodes with abnormal cortical thickening.  Accordingly on 04/26/2018 she proceeded to biopsy of the right breast area in question. The pathology from this procedure showed ([id number]): Invasive ductal carcinoma, grade II with calcifications. One right axillary lymph node and one left axillary lymph node were negative for carcinoma. Prognostic indicators significant for: estrogen receptor, 100% positive and progesterone receptor, 80% positive, both with strong staining intensity. Proliferation marker Ki67 at 70%. HER2 amplified with ratios HER2/CEP17 signals 4.52 and average HER2 copies per cell 10.85  The patient's subsequent history is as detailed below.  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   Diabetes mellitus without complication ([place])    Family history of breast cancer    Family history of lung cancer    Family history of prostate cancer    Gastroenteritis 07/10/07   GERD 07/24/2006   GRAVE'S  DISEASE 01/01/2008   History of hidradenitis suppurativa    History of kidney stones    History of thrush    HIV DISEASE 07/24/2006   dx March 05   Hyperlipidemia    HYPERTENSION  07/24/2006   Hyperthyroidism 08/2006   Grave's Disease -diffuse radiotracer uptake 08/25/06 Thyroid scan-Cold nodule to R lower lobe of thyrorid   Menometrorrhagia    hx of   Nephrolithiasis    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 Completed 2 yrs Prednisone after lung bx confirmation. No symptoms since then.   Suppurative hidradenitis    Thyroid cancer ([place])    THYROID NODULE, RIGHT 02/08/2007  -2018 thyroid nodules were precancerous but pathology was negative for thyroid cancer.   PAST SURGICAL HISTORY: Past Surgical History:  Procedure Laterality Date   BREAST LUMPECTOMY WITH RADIOACTIVE SEED AND SENTINEL LYMPH NODE BIOPSY Right 10/03/2018   Procedure: RIGHT BREAST LUMPECTOMY WITH RADIOACTIVE SEED AND SENTINEL LYMPH NODE MAPPING;  Surgeon: Erroll Luna, MD;  Location: [facility];  Service: General;  Laterality: Right;   BREAST SURGERY  1997   Breast Reduction    CYSTOSCOPY W/ URETERAL STENT REMOVAL  11/09/2012   Procedure: CYSTOSCOPY WITH STENT REMOVAL;  Surgeon: Alexis Frock, MD;  Location[facility];  Service: Urology;  Laterality: Right;   CYSTOSCOPY WITH RETROGRADE PYELOGRAM, URETEROSCOPY AND STENT PLACEMENT  11/09/2012   Procedure: CYSTOSCOPY WITH RETROGRADE PYELOGRAM, URETEROSCOPY AND STENT PLACEMENT;  Surgeon: Alexis Frock, MD;  Location[facility];  Service: Urology;  Laterality: Left;  LEFT URETEROSCOPY, STONE MANIPULATION, left STENT exchange    CYSTOSCOPY WITH STENT PLACEMENT  10/02/2012   Procedure: CYSTOSCOPY WITH STENT PLACEMENT;  Surgeon: Alexis Frock, MD;  Location: [facility];  Service: Urology;  Laterality: Left;   DILATION AND CURETTAGE OF UTERUS  Feb 2004   s/p for 1st trimester nonviable pregnancy   EYE SURGERY     sty under eyelid   INCISE AND DRAIN ABCESS  Nov 03    s/p I &D for righ inframmary fold hidradenitis   INCISION AND DRAINAGE PERITONSILLAR ABSCESS  Mar 03   IR CV LINE INJECTION  06/07/2018   IR IMAGING GUIDED PORT INSERTION  06/20/2018   IR REMOVAL TUN ACCESS W/ PORT W/O FL MOD SED  06/20/2018   IRRIGATION AND DEBRIDEMENT ABSCESS  01/31/2012   Procedure: IRRIGATION AND DEBRIDEMENT ABSCESS;  Surgeon: Shann Medal, MD;  [facility];  Service: General;  Laterality: Right;  right breast and axilla    NEPHROLITHOTOMY  10/02/2012   Procedure: NEPHROLITHOTOMY PERCUTANEOUS;  Surgeon: Alexis Frock, MD;  [facility];  Service: Urology;  Laterality: Right;  First Stage Percutaneous Nephrolithotomy with Surgeon Access, Left Ureteral Stent     NEPHROLITHOTOMY  10/04/2012   Procedure: NEPHROLITHOTOMY PERCUTANEOUS SECOND LOOK;  Surgeon: Alexis Frock, MD;  [facility];  Service: Urology;  Laterality: Right;      NEPHROLITHOTOMY  10/08/2012   Procedure: NEPHROLITHOTOMY PERCUTANEOUS;  Surgeon: Alexis Frock, MD;  [facility];  Service: Urology;  Laterality: Right;  THIRD STAGE, nephrostomy tube exchange x 2   NEPHROLITHOTOMY  10/11/2012   Procedure: NEPHROLITHOTOMY PERCUTANEOUS SECOND LOOK;  Surgeon: Alexis Frock, MD;  [facility];  Service: Urology;  Laterality: Right;  RIGHT 4 STAGE PERCUTANOUS NEPHROLITHOTOMY, right URETEROSCOPY WITH HOLMIUM LASER    PORTACATH PLACEMENT  Left 05/17/2018   Procedure: INSERTION PORT-A-CATH;  Surgeon: Coralie Keens, MD;  Location: [facility];  Service: General;  Laterality: Left;   RADICAL NECK DISSECTION  04/12/2017   limited/notes 04/12/2017   RADICAL NECK DISSECTION N/A 04/12/2017   Procedure: RADICAL NECK DISSECTION;  Surgeon: Melida Quitter, MD;  Location: [facility];  Service: ENT;  Laterality: N/A;  limited neck dissection 2 hours total   REDUCTION MAMMAPLASTY Bilateral [place]  04/12/2017   completion/notes 04/12/2017   THYROIDECTOMY N/A  04/12/2017   Procedure: THYROIDECTOMY;  Surgeon: Melida Quitter, MD;  Location: [facility];  Service: ENT;  Laterality: N/A;  Completion Thyroidectomy   TOTAL THYROIDECTOMY  2010  Thyroid nodules removed- pathology at the ENT doctor 2018 were benign -Over active thyroid and thyroidectomy with benign pathology  FAMILY HISTORY Family History  Problem Relation Age of Onset   Hypertension Mother    Cancer Mother        laryngeal   Heart disease Mother        stent   Hypertension Father    Lung cancer Father 63       hx smoking   Heart disease Other    Hypertension Other    Stroke Other        Grandparent   Kidney disease Other        Grandparent   Diabetes Other        FH of Diabetes   Hypertension Sister    Cancer Maternal Uncle        Lung CA   Hypertension Brother    Hypertension Sister    Breast cancer Maternal Aunt 65   Breast cancer Paternal Aunt 52   Prostate cancer Paternal Uncle    Breast cancer Maternal Aunt        dx 60+   Breast cancer Paternal Aunt        dx 51's   Breast cancer Paternal Aunt        dx 50's   Prostate cancer Paternal Uncle    Lung cancer Paternal Uncle    Breast cancer Cousin 60   Breast cancer Cousin        dx <50   Breast cancer Cousin        dx <50   Breast cancer Cousin        dx <50   Colon cancer Neg Hx    Esophageal cancer Neg Hx    Rectal cancer Neg Hx    Stomach cancer Neg Hx   The patient's mother is alive at age 14. The patient's father died at 2 from lung cancer (heavy smoker). She does not know much about her father. The patient has 1 brother and 2 sisters. The patient's mother had a history of laryngeal cancer. There was a maternal cousin with breast cancer diagnosed at age 41. There were 2 maternal aunts with breast cancer, the youngest being diagnosed in the 37's. There was a maternal great aunt with breast cancer. There were 2 paternal aunts with breast cancer.    GYNECOLOGIC HISTORY:    Patient's last menstrual period was 03/31/2014. Menarche: 53 years old Age at first live birth: 53 years old She is GX P1.  Her LMP was in 2017. She did not use HRT.    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 At home is the patient's daughter, Verdie Drown, age 48, who is a Engineer, technical sales education and  working part-time at the [employer]. The patient attends [employer].   ADVANCED DIRECTIVES: Not in place; at the 05/10/2018 visit the patient was given the appropriate documents to complete and notarized at her discretion   HEALTH MAINTENANCE: Social History   Tobacco Use   Smoking status: Former Smoker    Packs/day: 0.50    Years: 15.00    Pack years: 7.50    Types: Cigarettes    Start date: 04/12/2017   Smokeless tobacco: Never Used   Tobacco comment: quit smoking  may 2018  Substance Use Topics   Alcohol use: Yes    Alcohol/week: 0.0 standard drinks    Comment: social   Drug use: No     Colonoscopy: no  PAP: [facility]. 2018  Bone density: remote   Allergies  Allergen Reactions   Genvoya [Elviteg-Cobic-Emtricit-Tenofaf] Hives   Lisinopril Cough   Valsartan Other (See Comments) and Cough    Pt states she tolerates medicine now    Current Outpatient Medications  Medication Sig Dispense Refill   amLODipine (NORVASC) 10 MG tablet Take 1 tablet (10 mg total) by mouth daily. For blood pressure. 90 tablet 3   atorvastatin (LIPITOR) 20 MG tablet Take 1 tablet by mouth every evening for high cholesterol. NEED APPOINTMENT FOR ANY MORE REFILLS (Patient taking differently: Take 20 mg by mouth every evening. ) 30 tablet 0   dolutegravir ([name]) 50 MG tablet Take 1 tablet (50 mg total) by mouth daily. 30 tablet 11   doxycycline (VIBRA-TABS) 100 MG tablet 100 mg daily.      emtricitabine-tenofovir AF (DESCOVY) 200-25 MG tablet Take 1 tablet by mouth daily. 30 tablet 11   fluconazole (DIFLUCAN) 100 MG tablet  2 tablets for first dose then 1 tab a day x 2 days 4 tablet 0   irbesartan-hydrochlorothiazide (AVALIDE) 150-12.5 MG tablet TAKE 1 TABLET BY MOUTH ONCE DAILY FOR BLOOD PRESSURE. 90 tablet 1   levothyroxine (SYNTHROID) 175 MCG tablet TAKE 1 TABLET BY MOUTH EVERY MORNING ON AN EMPTY STOMACH WITH A FULL GLASS OF WATER. 90 tablet 0   metFORMIN (GLUCOPHAGE) 500 MG tablet Take 1 tablet (500 mg total) by mouth 2 (two) times daily with a meal. 180 tablet 3   metoprolol succinate (TOPROL-XL) 50 MG 24 hr tablet TAKE 1 TABLET (50 MG TOTAL) BY MOUTH DAILY. TAKE WITH OR IMMEDIATELY FOLLOWING A MEAL. 90 tablet 0   mometasone (NASONEX) 50 MCG/ACT nasal spray PLACE 2 SPRAYS INTO THE NOSE DAILY. 17 g 3   Multiple Vitamin (MULTIVITAMIN) tablet Take 1 tablet by mouth daily.     ONE TOUCH LANCETS MISC Use as instructed to test blood sugar daily 200 each 5   ONETOUCH VERIO test strip USE AS INSTRUCTED TO TEST BLOOD SUGAR DAILY NEED APPOINTMENT FOR ANY MORE REFILLS 50 each 0   potassium chloride (K-DUR) 10 MEQ tablet Take 2 tablets (20 mEq total) by mouth 2 (two) times daily. 20 tablet 0   PROAIR RESPICLICK 250 (90 Base) MCG/ACT AEPB INHALE 2 PUFFS INTO THE LUNGS EVERY 6 HOURS AS NEEDED FOR WHEEZING OR SHORTNESS OF BREATH. 1 each 2   spironolactone (ALDACTONE) 25 MG tablet Take 1 tablet (25 mg total) by mouth 2 (two) times daily. 60 tablet 3   tamoxifen (NOLVADEX) 20 MG tablet Take 1 tablet (20 mg total) by mouth daily. 30 tablet 5   No current facility-administered medications for this visit.    Facility-Administered Medications Ordered in Other Visits  Medication Dose Route  Frequency Provider Last Rate Last Dose   sodium chloride flush (NS) 0.9 % injection 10 mL  10 mL Intracatheter PRN Brigg Cape, Virgie Dad, MD   10 mL at 09/19/18 1551    OBJECTIVE: Middle-aged African-American woman in no acute distress  Vitals:   04/12/19 0845  BP: (!) 157/96  Pulse: 85  Resp: 17  Temp: 99.1 F (37.3 C)  SpO2:  100%     Body mass index is 38.92 kg/m.   Wt Readings from Last 3 Encounters:  04/12/19 233 lb 14.4 oz (106.1 kg)  03/22/19 235 lb 11.2 oz (106.9 kg)  02/28/19 235 lb 6.4 oz (106.8 kg)  ECOG FS:1 - Symptomatic but completely ambulatory  Sclerae unicteric, pupils round and equal Wearing a mask No cervical or supraclavicular adenopathy Lungs no rales or rhonchi Heart regular rate and rhythm Abd soft, nontender, positive bowel sounds MSK no focal spinal tenderness, no upper extremity lymphedema Neuro: nonfocal, well oriented, appropriate affect Breasts: The right breast is status post lumpectomy and radiation.  There is some residual hyperpigmentation.  There is no swelling and no evidence of residual or recurrent disease.  LAB RESULTS:  CMP     Component Value Date/Time   NA 138 03/22/2019 0758   K 3.5 03/22/2019 0758   CL 101 03/22/2019 0758   CO2 24 03/22/2019 0758   GLUCOSE 249 (H) 03/22/2019 0758   BUN 23 (H) 03/22/2019 0758   CREATININE 1.71 (H) 03/22/2019 0758   CREATININE 1.28 (H) 05/10/2018 1458   CREATININE 1.02 09/23/2015 1047   CALCIUM 8.5 (L) 03/22/2019 0758   PROT 7.9 03/22/2019 0758   ALBUMIN 2.8 (L) 03/22/2019 0758   AST 40 03/22/2019 0758   AST 28 05/10/2018 1458   ALT 27 03/22/2019 0758   ALT 23 05/10/2018 1458   ALKPHOS 91 03/22/2019 0758   BILITOT <0.2 (L) 03/22/2019 0758   BILITOT <0.2 (L) 05/10/2018 1458   GFRNONAA 34 (L) 03/22/2019 0758   GFRNONAA 48 (L) 05/10/2018 1458   GFRAA 40 (L) 03/22/2019 0758   GFRAA 56 (L) 05/10/2018 1458    No results found for: TOTALPROTELP, ALBUMINELP, A1GS, A2GS, BETS, BETA2SER, GAMS, MSPIKE, SPEI  No results found for: KPAFRELGTCHN, LAMBDASER, KAPLAMBRATIO  Lab Results  Component Value Date   WBC 6.8 03/22/2019   NEUTROABS 4.7 03/22/2019   HGB 11.5 (L) 03/22/2019   HCT 35.4 (L) 03/22/2019   MCV 91.9 03/22/2019   PLT 227 03/22/2019    '@LASTCHEMISTRY'$ @  No results found for: LABCA2  No components found  for: [id number]  No results for input(s): INR in the last 168 hours.  No results found for: LABCA2  No results found for: EXB284  No results found for: XLK440  No results found for: NUU725  No results found for: CA2729  No components found for: HGQUANT  No results found for: CEA1 / No results found for: CEA1   No results found for: AFPTUMOR  No results found for: CHROMOGRNA  No results found for: PSA1  No visits with results within 3 Day(s) from this visit.  Latest known visit with results is:  Appointment on 03/22/2019  Component Date Value Ref Range Status   Sodium 03/22/2019 138  135 - 145 mmol/L Final   Potassium 03/22/2019 3.5  3.5 - 5.1 mmol/L Final   Chloride 03/22/2019 101  98 - 111 mmol/L Final   CO2 03/22/2019 24  22 - 32 mmol/L Final   Glucose, Bld 03/22/2019 249* 70 - 99 mg/dL Final  BUN 03/22/2019 23* 6 - 20 mg/dL Final   Creatinine, Ser 03/22/2019 1.71* 0.44 - 1.00 mg/dL Final   Calcium 03/22/2019 8.5* 8.9 - 10.3 mg/dL Final   Total Protein 03/22/2019 7.9  6.5 - 8.1 g/dL Final   Albumin 03/22/2019 2.8* 3.5 - 5.0 g/dL Final   AST 03/22/2019 40  15 - 41 U/L Final   ALT 03/22/2019 27  0 - 44 U/L Final   Alkaline Phosphatase 03/22/2019 91  38 - 126 U/L Final   Total Bilirubin 03/22/2019 <0.2* 0.3 - 1.2 mg/dL Final   GFR calc non Af Amer 03/22/2019 34* >60 mL/min Final   GFR calc Af Amer 03/22/2019 40* >60 mL/min Final   Anion gap 03/22/2019 13  5 - 15 Final   Performed at [facility], [place] [address] [place], [place] [address]   WBC 03/22/2019 6.8  4.0 - 10.5 K/uL Final   RBC 03/22/2019 3.85* 3.87 - 5.11 MIL/uL Final   Hemoglobin 03/22/2019 11.5* 12.0 - 15.0 g/dL Final   HCT 03/22/2019 35.4* 36.0 - 46.0 % Final   MCV 03/22/2019 91.9  80.0 - 100.0 fL Final   MCH 03/22/2019 29.9  26.0 - 34.0 pg Final   MCHC 03/22/2019 32.5  30.0 - 36.0 g/dL Final   RDW 03/22/2019 18.3* 11.5 - 15.5 % Final   Platelets  03/22/2019 227  150 - 400 K/uL Final   nRBC 03/22/2019 0.0  0.0 - 0.2 % Final   Neutrophils Relative % 03/22/2019 68  % Final   Neutro Abs 03/22/2019 4.7  1.7 - 7.7 K/uL Final   Lymphocytes Relative 03/22/2019 21  % Final   Lymphs Abs 03/22/2019 1.4  0.7 - 4.0 K/uL Final   Monocytes Relative 03/22/2019 7  % Final   Monocytes Absolute 03/22/2019 0.5  0.1 - 1.0 K/uL Final   Eosinophils Relative 03/22/2019 3  % Final   Eosinophils Absolute 03/22/2019 0.2  0.0 - 0.5 K/uL Final   Basophils Relative 03/22/2019 0  % Final   Basophils Absolute 03/22/2019 0.0  0.0 - 0.1 K/uL Final   Immature Granulocytes 03/22/2019 1  % Final   Abs Immature Granulocytes 03/22/2019 0.04  0.00 - 0.07 K/uL Final   Performed at [facility], [address] [place], [place] [address]    (this displays the last labs from the last 3 days)  No results found for: TOTALPROTELP, ALBUMINELP, A1GS, A2GS, BETS, BETA2SER, GAMS, MSPIKE, SPEI (this displays SPEP labs)  No results found for: KPAFRELGTCHN, LAMBDASER, KAPLAMBRATIO (kappa/lambda light chains)  No results found for: HGBA, HGBA2QUANT, HGBFQUANT, HGBSQUAN (Hemoglobinopathy evaluation)   No results found for: LDH  Lab Results  Component Value Date   IRON 26 (L) 02/08/2007   TIBC 371 02/08/2007   IRONPCTSAT 7 (L) 02/08/2007   (Iron and TIBC)  No results found for: FERRITIN  Urinalysis    Component Value Date/Time   COLORURINE YELLOW 10/21/2014 2347   APPEARANCEUR CLOUDY (A) 10/21/2014 2347   LABSPEC 1.014 10/21/2014 2347   PHURINE 6.0 10/21/2014 2347   GLUCOSEU NEGATIVE 10/21/2014 2347   GLUCOSEU NEG mg/dL 01/23/2008 1418   HGBUR LARGE (A) 10/21/2014 2347   BILIRUBINUR negative 12/29/2017 [place] 10/21/2014 2347   PROTEINUR 2+ 12/29/2017 1046   PROTEINUR 100 (A) 10/21/2014 2347   UROBILINOGEN 0.2 12/29/2017 1046   UROBILINOGEN 0.2 10/21/2014 2347   NITRITE negative 12/29/2017 1046    NITRITE NEGATIVE 10/21/2014 2347   LEUKOCYTESUR Large (3+) (  A) 12/29/2017 1046     STUDIES: No results found.   ELIGIBLE FOR AVAILABLE RESEARCH PROTOCOL: UPBEAT   ASSESSMENT: 53 y.o. [place], [place] woman status post right breast upper inner quadrant biopsy 04/26/2018 for a clinical T2 N0, stage IB invasive ductal carcinoma, grade 2, ER/PR positive, HER-2 positive with an MIB-1 of 70%.  (1) genetics testing 07/03/2018: no pathogenic mutations. Genes Analyzed (43 total): APC*, ATM*, AXIN2, BARD1, BMPR1A, BRCA1*, BRCA2*, BRIP1*, CDH1*, CDK4, CDKN2A, CHEK2*, DICER1,GALNT12, HOXB13, MEN1, MLH1*, MRE11A, MSH2*, MSH3, MSH6*, MUTYH*, NBN, NF1*, NTHL1, PALB2*, PMS2*, POLD1, POLE,PTEN*, RAD50, RAD51C*, RAD51D*, RET, SDHB, SDHD, SMAD4, SMARCA4, STK11 and TP53* (sequencing and deletion/duplication); EGFR (sequencing only); EPCAM and GREM1 (deletion/duplication only). DNA and RNA analyses performed for * genes.  (2) neoadjuvant chemotherapy consisting of carboplatin, docetaxel, trastuzumab and pertuzumab given every 21 days x 6, starting 05/31/2018, completed 09/14/2018  (a) Pertuzumab discontinued starting with cycle 2 because of diarrhea problems  (b) Taxotere stopped after 4 cycles due peripheral neuropathy and substituted for last two cycles with gemcitabine  (3)  definitive surgery on 10/03/2018 showed residual ypT1c ypN0 invasive ductal carcinoma, grade 2, with negative margins; a total of 5 sentinel lymph nodes removed; repeat prognostic panel again triple positive  (4) T-DM1 given adjuvantly due to residual disease: started 11/13/2018.  (a) echo on 05/16/2018 shows an EF of 60-65%.  (b) echo on 08/27/18 shows an EF 55-60%  (c) echo on 12/03/2018 shows an EF of 60-65%  (d) echocardiogram on 03/06/2019 showed an ejection fraction of 55% as read by Dr. Haroldine Laws.  (5) adjuvant radiation completed 01/07/2019 - 02/21/2019 (delayed due to wound healing issues)  Site/dose:   The patient initially  received a dose of 50.4 Gy in 28 fractions to the right breast using whole-breast tangent fields. This was delivered using a 3-D conformal technique. The patient then received a boost to the seroma. This delivered an additional 10 Gy in 5 fractions using 12e electrons with a special teletherapy technique. The total dose was 60.4 Gy.  (6) Tamoxifen started 02/28/2019  (7) hidradenitis/boils: on doxycycline daily   PLAN: Temika is tolerating T-DM1 well and she will proceed to treatment today.  She will continue to be treated every 3 weeks through the end of August of this year.  Those appointments and treatment orders are already in place.  She is tolerating tamoxifen generally well.  She is having the expected hot flashes.  Hopefully with time this will improve.   I have written a letter to her employer regarding the need to follow CDC guidelines especially given Lavena's medical situation.  They do not seem to be following instructions.  It is a mystery to me why they would not since it is not in their interest for their employees to get sick can draw high hospital bills and not be able to work, which would require substitutes, etc.  It may also place the business at risk of legal action.  Myrikal requested an update on her handicap sticker.  She is still a bit deconditioned and so I was glad to write that for her.  She will return to see me in October.  Shortly before that visit she will have a staging CT scan of the chest.  She knows to call for any other issue that may develop before the next visit.    Jaysiah Marchetta, Virgie Dad, MD  04/12/19 8:57 AM Medical Oncology and Hematology [facility] [address] [place], [place] [address] Tel. [phone]  Fax. [phone]  I, Jacqualyn Posey am acting as a Education administrator for Chauncey Cruel, MD.   I, Lurline Del MD, have reviewed the above documentation for accuracy and completeness, and I agree with the above.

## 2019-04-12 ENCOUNTER — Inpatient Hospital Stay: Payer: 59

## 2019-04-12 ENCOUNTER — Inpatient Hospital Stay (HOSPITAL_BASED_OUTPATIENT_CLINIC_OR_DEPARTMENT_OTHER): Payer: 59 | Admitting: Oncology

## 2019-04-12 ENCOUNTER — Other Ambulatory Visit: Payer: Self-pay

## 2019-04-12 ENCOUNTER — Other Ambulatory Visit: Payer: Self-pay | Admitting: *Deleted

## 2019-04-12 VITALS — BP 157/96 | HR 85 | Temp 99.1°F | Resp 17 | Ht 65.0 in | Wt 233.9 lb

## 2019-04-12 DIAGNOSIS — Z170 Estrogen receptor positive status [ER+]: Secondary | ICD-10-CM | POA: Diagnosis not present

## 2019-04-12 DIAGNOSIS — Z79810 Long term (current) use of selective estrogen receptor modulators (SERMs): Secondary | ICD-10-CM

## 2019-04-12 DIAGNOSIS — C50311 Malignant neoplasm of lower-inner quadrant of right female breast: Secondary | ICD-10-CM

## 2019-04-12 DIAGNOSIS — Z5112 Encounter for antineoplastic immunotherapy: Secondary | ICD-10-CM | POA: Diagnosis not present

## 2019-04-12 DIAGNOSIS — C73 Malignant neoplasm of thyroid gland: Secondary | ICD-10-CM

## 2019-04-12 DIAGNOSIS — G4700 Insomnia, unspecified: Secondary | ICD-10-CM

## 2019-04-12 DIAGNOSIS — E876 Hypokalemia: Secondary | ICD-10-CM

## 2019-04-12 DIAGNOSIS — Z95828 Presence of other vascular implants and grafts: Secondary | ICD-10-CM

## 2019-04-12 DIAGNOSIS — C50211 Malignant neoplasm of upper-inner quadrant of right female breast: Secondary | ICD-10-CM

## 2019-04-12 LAB — CBC WITH DIFFERENTIAL/PLATELET
Abs Immature Granulocytes: 0.04 10*3/uL (ref 0.00–0.07)
Basophils Absolute: 0 10*3/uL (ref 0.0–0.1)
Basophils Relative: 1 %
Eosinophils Absolute: 0.1 10*3/uL (ref 0.0–0.5)
Eosinophils Relative: 2 %
HCT: 37.3 % (ref 36.0–46.0)
Hemoglobin: 12.5 g/dL (ref 12.0–15.0)
Immature Granulocytes: 1 %
Lymphocytes Relative: 22 %
Lymphs Abs: 1.6 10*3/uL (ref 0.7–4.0)
MCH: 30 pg (ref 26.0–34.0)
MCHC: 33.5 g/dL (ref 30.0–36.0)
MCV: 89.4 fL (ref 80.0–100.0)
Monocytes Absolute: 0.6 10*3/uL (ref 0.1–1.0)
Monocytes Relative: 9 %
Neutro Abs: 4.6 10*3/uL (ref 1.7–7.7)
Neutrophils Relative %: 65 %
Platelets: 225 10*3/uL (ref 150–400)
RBC: 4.17 MIL/uL (ref 3.87–5.11)
RDW: 17.8 % — ABNORMAL HIGH (ref 11.5–15.5)
WBC: 7 10*3/uL (ref 4.0–10.5)
nRBC: 0 % (ref 0.0–0.2)

## 2019-04-12 LAB — COMPREHENSIVE METABOLIC PANEL
ALT: 29 U/L (ref 0–44)
AST: 44 U/L — ABNORMAL HIGH (ref 15–41)
Albumin: 2.9 g/dL — ABNORMAL LOW (ref 3.5–5.0)
Alkaline Phosphatase: 91 U/L (ref 38–126)
Anion gap: 9 (ref 5–15)
BUN: 17 mg/dL (ref 6–20)
CO2: 29 mmol/L (ref 22–32)
Calcium: 8.8 mg/dL — ABNORMAL LOW (ref 8.9–10.3)
Chloride: 103 mmol/L (ref 98–111)
Creatinine, Ser: 1.35 mg/dL — ABNORMAL HIGH (ref 0.44–1.00)
GFR calc Af Amer: 53 mL/min — ABNORMAL LOW (ref >60)
GFR calc non Af Amer: 46 mL/min — ABNORMAL LOW (ref >60)
Glucose, Bld: 146 mg/dL — ABNORMAL HIGH (ref 70–99)
Potassium: 3.4 mmol/L — ABNORMAL LOW (ref 3.5–5.1)
Sodium: 141 mmol/L (ref 135–145)
Total Bilirubin: <0.2 mg/dL — ABNORMAL LOW (ref 0.3–1.2)
Total Protein: 8.2 g/dL — ABNORMAL HIGH (ref 6.5–8.1)

## 2019-04-12 MED ORDER — SODIUM CHLORIDE 0.9 % IV SOLN
Freq: Once | INTRAVENOUS | Status: AC
  Administered 2019-04-12: 11:00:00 via INTRAVENOUS
  Filled 2019-04-12: qty 250

## 2019-04-12 MED ORDER — ACETAMINOPHEN 325 MG PO TABS
ORAL_TABLET | ORAL | Status: AC
  Filled 2019-04-12: qty 2

## 2019-04-12 MED ORDER — SODIUM CHLORIDE 0.9% FLUSH
10.0000 mL | INTRAVENOUS | Status: DC | PRN
  Administered 2019-04-12: 10 mL
  Filled 2019-04-12: qty 10

## 2019-04-12 MED ORDER — DIPHENHYDRAMINE HCL 25 MG PO CAPS
ORAL_CAPSULE | ORAL | Status: AC
  Filled 2019-04-12: qty 2

## 2019-04-12 MED ORDER — POTASSIUM CHLORIDE ER 10 MEQ PO TBCR: 20.0000 meq | EXTENDED_RELEASE_TABLET | Freq: Two times a day (BID) | ORAL | 1 refills | Status: DC

## 2019-04-12 MED ORDER — DIPHENHYDRAMINE HCL 25 MG PO CAPS
50.0000 mg | ORAL_CAPSULE | Freq: Once | ORAL | Status: AC
  Administered 2019-04-12: 50 mg via ORAL

## 2019-04-12 MED ORDER — HEPARIN SOD (PORK) LOCK FLUSH 100 UNIT/ML IV SOLN
500.0000 [IU] | Freq: Once | INTRAVENOUS | Status: AC | PRN
  Administered 2019-04-12: 500 [IU]
  Filled 2019-04-12: qty 5

## 2019-04-12 MED ORDER — SODIUM CHLORIDE 0.9 % IV SOLN
360.0000 mg | Freq: Once | INTRAVENOUS | Status: AC
  Administered 2019-04-12: 360 mg via INTRAVENOUS
  Filled 2019-04-12: qty 10

## 2019-04-12 MED ORDER — ACETAMINOPHEN 325 MG PO TABS
650.0000 mg | ORAL_TABLET | Freq: Once | ORAL | Status: AC
  Administered 2019-04-12: 650 mg via ORAL

## 2019-04-12 NOTE — Patient Instructions (Signed)
[facility] Discharge Instructions for Patients Receiving Chemotherapy  Today you received the following chemotherapy agents [place]  To help prevent nausea and vomiting after your treatment, we encourage you to take your nausea medication as directed   If you develop nausea and vomiting that is not controlled by your nausea medication, call the clinic.   BELOW ARE SYMPTOMS THAT SHOULD BE REPORTED IMMEDIATELY:  *FEVER GREATER THAN 100.5 F  *CHILLS WITH OR WITHOUT FEVER  NAUSEA AND VOMITING THAT IS NOT CONTROLLED WITH YOUR NAUSEA MEDICATION  *UNUSUAL SHORTNESS OF BREATH  *UNUSUAL BRUISING OR BLEEDING  TENDERNESS IN MOUTH AND THROAT WITH OR WITHOUT PRESENCE OF ULCERS  *URINARY PROBLEMS  *BOWEL PROBLEMS  UNUSUAL RASH Items with * indicate a potential emergency and should be followed up as soon as possible.  Feel free to call the clinic should you have any questions or concerns. The clinic phone number is [phone].  Please show the CHEMO ALERT CARD at check-in to the Emergency Department and triage nurse.   

## 2019-05-02 NOTE — Progress Notes (Signed)
[facility]  [phone]) [phone] [phone]    ID: [name] DOB: 11-02-1965  MR#: [id number]  KKX#:[id number]  Patient Care Team: [name], NP as PCP - General (Internal Medicine) Michel Bickers, MD as PCP - Infectious Diseases (Infectious Diseases) Magrinat, Virgie Dad, MD as Consulting Physician (Oncology) Kyung Rudd, MD as Consulting Physician (Radiation Oncology) OTHER MD:   CHIEF COMPLAINT: Triple positive breast cancer  CURRENT TREATMENT: Adjuvant immunotherapy/adjuvant radiation   INTERVAL HISTORY: [name] returns today for follow-up and treatment of her triple positive breast cancer.  She continues on adjuvant TDM1. Today is day 1 cycle 8. She tolerates this well and without any noticeable side effects.    She also continues on tamoxifen.  She has some hot flashes.  She is tolerating these.  She denies arthralgias or vaginal discharge.    Tyrhonda's last echocardiogram on 03/06/2019, showed an ejection fraction of 50%. She sees Dr. Haroldine Laws regularly and will see him again in 05/2019.  Since her last visit here, she has not undergone any additional studies.     REVIEW OF SYSTEMS: [place] continues to work. Dr. [name] wrote a letter to [employer] regarding their not practicing social distancing around [place] and not wearing a mask.  Since they received the letter, things have improved, and have allowed [place] to have a voice regarding her colleagues who may not be following the proper protocols.  She denies any breast changes or concerns.    Shamyra denies any pain, shortness of breath, cough, nausea, vomiting, bowel/bladder changes, headaches, vision changes, mucositis, skin rash/lesion, or any other concerns.  A detailed ROS was otherwise non contributory.    HISTORY OF CURRENT ILLNESS: From the original intake note:  [name] (pronounced "Huff") palpated a mass in the right breast for about 2 weeks before  bringing it to medical attention. She underwent bilateral diagnostic mammography with tomography and right breast ultrasonography at [facility] on 04/20/2018 showing: breast density category B. There was a highly suspicious mass located at 3 o'clock in the upper inner quadrant measuring 2.9 x 2.5 x 1.6 cm and 4 cm from the nipple. Ultrasound of the right axilla showed 5 lymph nodes with abnormal cortical thickening. The left axilla showed 3 lymph nodes with abnormal cortical thickening.  Accordingly on 04/26/2018 she proceeded to biopsy of the right breast area in question. The pathology from this procedure showed ([id number]): Invasive ductal carcinoma, grade II with calcifications. One right axillary lymph node and one left axillary lymph node were negative for carcinoma. Prognostic indicators significant for: estrogen receptor, 100% positive and progesterone receptor, 80% positive, both with strong staining intensity. Proliferation marker Ki67 at 70%. HER2 amplified with ratios HER2/CEP17 signals 4.52 and average HER2 copies per cell 10.85  The patient's subsequent history is as detailed below.  PAST MEDICAL HISTORY: Past Medical History:  Diagnosis Date  . Allergy   . Anemia    Normocytic  . Anxiety   . Asthma   . Blood dyscrasia   . Bronchitis 2005  . 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 Diabetes mellitus without complication ([place])   . Family history of breast cancer   . Family history of lung cancer   . Family history of prostate cancer   . Gastroenteritis 07/10/07  . GERD 07/24/2006  . GRAVE'S DISEASE 01/01/2008  . History of hidradenitis suppurativa   . History of kidney stones   . History of thrush   . HIV DISEASE 07/24/2006   dx  March 05  . Hyperlipidemia   . HYPERTENSION 07/24/2006  . Hyperthyroidism 08/2006   Grave's Disease -diffuse radiotracer uptake 08/25/06 Thyroid scan-Cold nodule to R lower lobe of thyrorid  . Menometrorrhagia    hx of  .  Nephrolithiasis   . 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 Pneumonia 2005  . 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 Sarcoidosis 02/08/2007   dx as a teenager in [place] from abnl CXR. Completed 2 yrs Prednisone after lung bx confirmation. No symptoms since then.  . Suppurative hidradenitis   . Thyroid cancer ([place])   . THYROID NODULE, RIGHT 02/08/2007  -2018 thyroid nodules were precancerous but pathology was negative for thyroid cancer.   PAST SURGICAL HISTORY: Past Surgical History:  Procedure Laterality Date  . BREAST LUMPECTOMY WITH RADIOACTIVE SEED AND SENTINEL LYMPH NODE BIOPSY Right 10/03/2018   Procedure: RIGHT BREAST LUMPECTOMY WITH RADIOACTIVE SEED AND SENTINEL LYMPH NODE MAPPING;  Surgeon: Erroll Luna, MD;  Location: [facility];  Service: General;  Laterality: Right;  . BREAST SURGERY  1997   Breast Reduction   . CYSTOSCOPY W/ URETERAL STENT REMOVAL  11/09/2012   Procedure: CYSTOSCOPY WITH STENT REMOVAL;  Surgeon: Alexis Frock, MD;  Location: [facility];  Service: Urology;  Laterality: Right;  . CYSTOSCOPY WITH RETROGRADE PYELOGRAM, URETEROSCOPY AND STENT PLACEMENT  11/09/2012   Procedure: CYSTOSCOPY WITH RETROGRADE PYELOGRAM, URETEROSCOPY AND STENT PLACEMENT;  Surgeon: Alexis Frock, MD;  Location: [facility];  Service: Urology;  Laterality: Left;  LEFT URETEROSCOPY, STONE MANIPULATION, left STENT exchange   . CYSTOSCOPY WITH STENT PLACEMENT  10/02/2012   Procedure: CYSTOSCOPY WITH STENT PLACEMENT;  Surgeon: Alexis Frock, MD;  Location: [facility];  Service: Urology;  Laterality: Left;  . DILATION AND CURETTAGE OF UTERUS  Feb 2004   s/p for 1st trimester nonviable pregnancy  . EYE SURGERY     sty under eyelid  . INCISE AND DRAIN ABCESS  Nov 03   s/p I &D for righ inframmary fold hidradenitis  . INCISION AND DRAINAGE PERITONSILLAR ABSCESS  Mar 03  . IR CV LINE INJECTION  06/07/2018  . IR IMAGING GUIDED PORT INSERTION   06/20/2018  . IR REMOVAL TUN ACCESS W/ PORT W/O FL MOD SED  06/20/2018  . IRRIGATION AND DEBRIDEMENT ABSCESS  01/31/2012   Procedure: IRRIGATION AND DEBRIDEMENT ABSCESS;  Surgeon: Shann Medal, MD;  Location: [facility];  Service: General;  Laterality: Right;  right breast and axilla   . NEPHROLITHOTOMY  10/02/2012   Procedure: NEPHROLITHOTOMY PERCUTANEOUS;  Surgeon: Alexis Frock, MD;  [facility];  Service: Urology;  Laterality: Right;  First Stage Percutaneous Nephrolithotomy with Surgeon Access, Left Ureteral Stent    . NEPHROLITHOTOMY  10/04/2012   Procedure: NEPHROLITHOTOMY PERCUTANEOUS SECOND LOOK;  Surgeon: Alexis Frock, MD;  [facility];  Service: Urology;  Laterality: Right;     . NEPHROLITHOTOMY  10/08/2012   Procedure: NEPHROLITHOTOMY PERCUTANEOUS;  Surgeon: Alexis Frock, MD;  Location[facility];  Service: Urology;  Laterality: Right;  THIRD STAGE, nephrostomy tube exchange x 2  . NEPHROLITHOTOMY  10/11/2012   Procedure: NEPHROLITHOTOMY PERCUTANEOUS SECOND LOOK;  Surgeon: Alexis Frock, MD;  Location[facility];  Service: Urology;  Laterality: Right;  RIGHT 4 STAGE PERCUTANOUS NEPHROLITHOTOMY, right URETEROSCOPY WITH HOLMIUM LASER   . PORTACATH PLACEMENT Left 05/17/2018   Procedure: INSERTION PORT-A-CATH;  Surgeon: Coralie Keens, MD;  Location: [facility];  Service: General;  Laterality: Left;  . RADICAL NECK DISSECTION  04/12/2017   limited/notes 04/12/2017  . RADICAL NECK DISSECTION N/A 04/12/2017   Procedure: RADICAL NECK DISSECTION;  Surgeon: Melida Quitter, MD;  Location: [facility];  Service: ENT;  Laterality: N/A;  limited neck dissection 2 hours total  . REDUCTION MAMMAPLASTY Bilateral 1998  . [place]  1994  . THYROIDECTOMY  04/12/2017   completion/notes 04/12/2017  . THYROIDECTOMY N/A 04/12/2017   Procedure: THYROIDECTOMY;  Surgeon: Melida Quitter, MD;  Location: [facility];  Service: ENT;  Laterality: N/A;  Completion Thyroidectomy  . TOTAL THYROIDECTOMY  2010   Thyroid nodules removed- pathology at the ENT doctor 2018 were benign -Over active thyroid and thyroidectomy with benign pathology  FAMILY HISTORY Family History  Problem Relation Age of Onset  . Hypertension Mother   . Cancer Mother        laryngeal  . Heart disease Mother        stent  . Hypertension Father   . Lung cancer Father 36       hx smoking  . Heart disease Other   . Hypertension Other   . Stroke Other        Grandparent  . Kidney disease Other        Grandparent  . Diabetes Other        FH of Diabetes  . Hypertension Sister   . Cancer Maternal Uncle        Lung CA  . Hypertension Brother   . Hypertension Sister   . Breast cancer Maternal Aunt 65  . Breast cancer Paternal Aunt 83  . Prostate cancer Paternal Uncle   . Breast cancer Maternal Aunt        dx 60+  . Breast cancer Paternal Aunt        dx 48's  . Breast cancer Paternal Aunt        dx 3's  . Prostate cancer Paternal Uncle   . Lung cancer Paternal Uncle   . Breast cancer Cousin 52  . Breast cancer Cousin        dx <50  . Breast cancer Cousin        dx <50  . Breast cancer Cousin        dx <50  . Colon cancer Neg Hx   . Esophageal cancer Neg Hx   . Rectal cancer Neg Hx   . Stomach cancer Neg Hx   The patient's mother is alive at age 72. The patient's father died at 55 from lung cancer (heavy smoker). She does not know much about her father. The patient has 1 brother and 2 sisters. The patient's mother had a history of laryngeal cancer. There was a maternal cousin with breast cancer diagnosed at age 73. There were 2 maternal aunts with breast cancer, the youngest being diagnosed in the 18's. There was a maternal great aunt with breast cancer. There were 2 paternal aunts with breast cancer.    GYNECOLOGIC HISTORY:  Patient's last menstrual period was 03/31/2014. Menarche: 53 years old Age at first live birth: 52 years old She is GX P1.  Her LMP was in 2017. She did not use HRT.    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 At home is the patient's daughter, Verdie Drown, age 57, who is a Engineer, technical sales education and working part-time at the [employer]. The patient attends [employer].   ADVANCED DIRECTIVES: Not in place; at the 05/10/2018 visit the patient was given the appropriate documents to  complete and notarized at her discretion   HEALTH MAINTENANCE: Social History   Tobacco Use  . Smoking status: Former Smoker    Packs/day: 0.50    Years: 15.00    Pack years: 7.50    Types: Cigarettes    Start date: 04/12/2017  . Smokeless tobacco: Never Used  . Tobacco comment: quit smoking  may 2018  Substance Use Topics  . Alcohol use: Yes    Alcohol/week: 0.0 standard drinks    Comment: social  . Drug use: No     Colonoscopy: no  PAP: [facility]. 2018  Bone density: remote   Allergies  Allergen Reactions  . Genvoya [Elviteg-Cobic-Emtricit-Tenofaf] Hives  . Lisinopril Cough  . Valsartan Other (See Comments) and Cough    Pt states she tolerates medicine now    Current Outpatient Medications  Medication Sig Dispense Refill  . amLODipine (NORVASC) 10 MG tablet Take 1 tablet (10 mg total) by mouth daily. For blood pressure. 90 tablet 3  . atorvastatin (LIPITOR) 20 MG tablet Take 1 tablet by mouth every evening for high cholesterol. NEED APPOINTMENT FOR ANY MORE REFILLS (Patient taking differently: Take 20 mg by mouth every evening. ) 30 tablet 0  . dolutegravir ([name]) 50 MG tablet Take 1 tablet (50 mg total) by mouth daily. 30 tablet 11  . doxycycline (VIBRA-TABS) 100 MG tablet 100 mg daily.     Marland Kitchen emtricitabine-tenofovir AF (DESCOVY) 200-25 MG tablet Take 1 tablet by mouth daily. 30 tablet 11  . fluconazole (DIFLUCAN) 100 MG tablet 2 tablets for first dose then 1 tab a day x 2 days 4 tablet 0  . irbesartan-hydrochlorothiazide (AVALIDE) 150-12.5 MG tablet TAKE 1 TABLET BY MOUTH ONCE DAILY FOR BLOOD PRESSURE. 90  tablet 1  . levothyroxine (SYNTHROID) 175 MCG tablet TAKE 1 TABLET BY MOUTH EVERY MORNING ON AN EMPTY STOMACH WITH A FULL GLASS OF WATER. 90 tablet 0  . metFORMIN (GLUCOPHAGE) 500 MG tablet Take 1 tablet (500 mg total) by mouth 2 (two) times daily with a meal. 180 tablet 3  . metoprolol succinate (TOPROL-XL) 50 MG 24 hr tablet TAKE 1 TABLET (50 MG TOTAL) BY MOUTH DAILY. TAKE WITH OR IMMEDIATELY FOLLOWING A MEAL. 90 tablet 0  . mometasone (NASONEX) 50 MCG/ACT nasal spray PLACE 2 SPRAYS INTO THE NOSE DAILY. 17 g 3  . Multiple Vitamin (MULTIVITAMIN) tablet Take 1 tablet by mouth daily.    . ONE TOUCH LANCETS MISC Use as instructed to test blood sugar daily 200 each 5  . ONETOUCH VERIO test strip USE AS INSTRUCTED TO TEST BLOOD SUGAR DAILY NEED APPOINTMENT FOR ANY MORE REFILLS 50 each 0  . potassium chloride (K-DUR) 10 MEQ tablet Take 2 tablets (20 mEq total) by mouth 2 (two) times daily. 60 tablet 1  . PROAIR RESPICLICK 175 (90 Base) MCG/ACT AEPB INHALE 2 PUFFS INTO THE LUNGS EVERY 6 HOURS AS NEEDED FOR WHEEZING OR SHORTNESS OF BREATH. 1 each 2  . spironolactone (ALDACTONE) 25 MG tablet Take 1 tablet (25 mg total) by mouth 2 (two) times daily. 60 tablet 3  . tamoxifen (NOLVADEX) 20 MG tablet Take 1 tablet (20 mg total) by mouth daily. 30 tablet 5   No current facility-administered medications for this visit.    Facility-Administered Medications Ordered in Other Visits  Medication Dose Route Frequency Provider Last Rate Last Dose  . sodium chloride flush (NS) 0.9 % injection 10 mL  10 mL Intracatheter PRN Magrinat, Virgie Dad, MD   10 mL at  09/19/18 1551    OBJECTIVE:  Vitals:   05/03/19 0918  BP: 135/66  Pulse: 80  Resp: 18  Temp: 98.5 F (36.9 C)  SpO2: 100%     Body mass index is 39.11 kg/m.   Wt Readings from Last 3 Encounters:  05/03/19 235 lb (106.6 kg)  04/12/19 233 lb 14.4 oz (106.1 kg)  03/22/19 235 lb 11.2 oz (106.9 kg)  ECOG FS:1 - Symptomatic but completely ambulatory  GENERAL: Patient is a well appearing female in no acute distress HEENT:  Sclerae anicteric.  Oropharynx clear and moist. No ulcerations or evidence of oropharyngeal candidiasis. Neck is supple.  NODES:  No cervical, supraclavicular, or axillary lymphadenopathy palpated.  BREAST EXAM:  Deferred. LUNGS:  Clear to auscultation bilaterally.  No wheezes or rhonchi. HEART:  Regular rate and rhythm. No murmur appreciated. ABDOMEN:  Soft, nontender.  Positive, normoactive bowel sounds. No organomegaly palpated. MSK:  No focal spinal tenderness to palpation. Full range of motion bilaterally in the upper extremities. EXTREMITIES:  No peripheral edema.   SKIN:  Clear with no obvious rashes or skin changes. No nail dyscrasia. NEURO:  Nonfocal. Well oriented.  Appropriate affect.    LAB RESULTS:  CMP     Component Value Date/Time   NA 141 04/12/2019 0911   K 3.4 (L) 04/12/2019 0911   CL 103 04/12/2019 0911   CO2 29 04/12/2019 0911   GLUCOSE 146 (H) 04/12/2019 0911   BUN 17 04/12/2019 0911   CREATININE 1.35 (H) 04/12/2019 0911   CREATININE 1.28 (H) 05/10/2018 1458   CREATININE 1.02 09/23/2015 1047   CALCIUM 8.8 (L) 04/12/2019 0911   PROT 8.2 (H) 04/12/2019 0911   ALBUMIN 2.9 (L) 04/12/2019 0911   AST 44 (H) 04/12/2019 0911   AST 28 05/10/2018 1458   ALT 29 04/12/2019 0911   ALT 23 05/10/2018 1458   ALKPHOS 91 04/12/2019 0911   BILITOT <0.2 (L) 04/12/2019 0911   BILITOT <0.2 (L) 05/10/2018 1458   GFRNONAA 46 (L) 04/12/2019 0911   GFRNONAA 48 (L) 05/10/2018 1458   GFRAA 53 (L) 04/12/2019 0911   GFRAA 56 (L) 05/10/2018 1458    No results found for: TOTALPROTELP, ALBUMINELP, A1GS, A2GS, BETS, BETA2SER, GAMS, MSPIKE, SPEI  No results found for: KPAFRELGTCHN, LAMBDASER, KAPLAMBRATIO  Lab Results  Component Value Date   WBC 6.7 05/03/2019   NEUTROABS 4.4 05/03/2019   HGB 12.1 05/03/2019   HCT 36.1 05/03/2019   MCV 89.6 05/03/2019   PLT 177 05/03/2019    _0 @  No  results found for: LABCA2  No components found for: [id number]  No results for input(s): INR in the last 168 hours.  No results found for: LABCA2  No results found for: WUJ811  No results found for: BJY782  No results found for: NFA213  No results found for: CA2729  No components found for: HGQUANT  No results found for: CEA1 / No results found for: CEA1   No results found for: AFPTUMOR  No results found for: CHROMOGRNA  No results found for: PSA1  Appointment on 05/03/2019  Component Date Value Ref Range Status  . WBC 05/03/2019 6.7  4.0 - 10.5 K/uL Final  . RBC 05/03/2019 4.03  3.87 - 5.11 MIL/uL Final  . Hemoglobin 05/03/2019 12.1  12.0 - 15.0 g/dL Final  . HCT 05/03/2019 36.1  36.0 - 46.0 % Final  . MCV 05/03/2019 89.6  80.0 - 100.0 fL Final  . MCH 05/03/2019 30.0  26.0 - 34.0 pg Final  .  MCHC 05/03/2019 33.5  30.0 - 36.0 g/dL Final  . RDW 05/03/2019 17.5* 11.5 - 15.5 % Final  . Platelets 05/03/2019 177  150 - 400 K/uL Final  . nRBC 05/03/2019 0.0  0.0 - 0.2 % Final  . Neutrophils Relative % 05/03/2019 65  % Final  . Neutro Abs 05/03/2019 4.4  1.7 - 7.7 K/uL Final  . Lymphocytes Relative 05/03/2019 24  % Final  . Lymphs Abs 05/03/2019 1.6  0.7 - 4.0 K/uL Final  . Monocytes Relative 05/03/2019 8  % Final  . Monocytes Absolute 05/03/2019 0.5  0.1 - 1.0 K/uL Final  . Eosinophils Relative 05/03/2019 2  % Final  . Eosinophils Absolute 05/03/2019 0.1  0.0 - 0.5 K/uL Final  . Basophils Relative 05/03/2019 0  % Final  . Basophils Absolute 05/03/2019 0.0  0.0 - 0.1 K/uL Final  . Immature Granulocytes 05/03/2019 1  % Final  . Abs Immature Granulocytes 05/03/2019 0.05  0.00 - 0.07 K/uL Final   Performed at [facility], [address] [place], [place] [address]    (this displays the last labs from the last 3 days)  No results found for: TOTALPROTELP, ALBUMINELP, A1GS, A2GS, BETS, BETA2SER, GAMS, MSPIKE, SPEI (this displays SPEP labs)  No  results found for: KPAFRELGTCHN, LAMBDASER, KAPLAMBRATIO (kappa/lambda light chains)  No results found for: HGBA, HGBA2QUANT, HGBFQUANT, HGBSQUAN (Hemoglobinopathy evaluation)   No results found for: LDH  Lab Results  Component Value Date   IRON 26 (L) 02/08/2007   TIBC 371 02/08/2007   IRONPCTSAT 7 (L) 02/08/2007   (Iron and TIBC)  No results found for: FERRITIN  Urinalysis    Component Value Date/Time   COLORURINE YELLOW 10/21/2014 2347   APPEARANCEUR CLOUDY (A) 10/21/2014 2347   LABSPEC 1.014 10/21/2014 2347   PHURINE 6.0 10/21/2014 2347   GLUCOSEU NEGATIVE 10/21/2014 2347   GLUCOSEU NEG mg/dL 01/23/2008 1418   HGBUR LARGE (A) 10/21/2014 2347   BILIRUBINUR negative 12/29/2017 [place] 10/21/2014 2347   PROTEINUR 2+ 12/29/2017 1046   PROTEINUR 100 (A) 10/21/2014 2347   UROBILINOGEN 0.2 12/29/2017 1046   UROBILINOGEN 0.2 10/21/2014 2347   NITRITE negative 12/29/2017 1046   NITRITE NEGATIVE 10/21/2014 2347   LEUKOCYTESUR Large (3+) (A) 12/29/2017 1046     STUDIES: No results found.   ELIGIBLE FOR AVAILABLE RESEARCH PROTOCOL: UPBEAT   ASSESSMENT: 53 y.o. , [place] woman status post right breast upper inner quadrant biopsy 04/26/2018 for a clinical T2 N0, stage IB invasive ductal carcinoma, grade 2, ER/PR positive, HER-2 positive with an MIB-1 of 70%.  (1) genetics testing 07/03/2018: no pathogenic mutations. Genes Analyzed (43 total): APC*, ATM*, AXIN2, BARD1, BMPR1A, BRCA1*, BRCA2*, BRIP1*, CDH1*, CDK4, CDKN2A, CHEK2*, DICER1,GALNT12, HOXB13, MEN1, MLH1*, MRE11A, MSH2*, MSH3, MSH6*, MUTYH*, NBN, NF1*, NTHL1, PALB2*, PMS2*, POLD1, POLE,PTEN*, RAD50, RAD51C*, RAD51D*, RET, SDHB, SDHD, SMAD4, SMARCA4, STK11 and TP53* (sequencing and deletion/duplication); EGFR (sequencing only); EPCAM and GREM1 (deletion/duplication only). DNA and RNA analyses performed for * genes.  (2) neoadjuvant chemotherapy consisting of carboplatin, docetaxel, trastuzumab  and pertuzumab given every 21 days x 6, starting 05/31/2018, completed 09/14/2018  (a) Pertuzumab discontinued starting with cycle 2 because of diarrhea problems  (b) Taxotere stopped after 4 cycles due peripheral neuropathy and substituted for last two cycles with gemcitabine  (3)  definitive surgery on 10/03/2018 showed residual ypT1c ypN0 invasive ductal carcinoma, grade 2, with negative margins; a total of 5 sentinel lymph nodes removed; repeat prognostic panel again triple positive  (  4) T-DM1 given adjuvantly due to residual disease: started 11/13/2018.  (a) echo on 05/16/2018 shows an EF of 60-65%.  (b) echo on 08/27/18 shows an EF 55-60%  (c) echo on 12/03/2018 shows an EF of 60-65%  (d) echocardiogram on 03/06/2019 showed an ejection fraction of 55% as read by Dr. Haroldine Laws.  (5) adjuvant radiation completed 01/07/2019 - 02/21/2019 (delayed due to wound healing issues)  Site/dose:   The patient initially received a dose of 50.4 Gy in 28 fractions to the right breast using whole-breast tangent fields. This was delivered using a 3-D conformal technique. The patient then received a boost to the seroma. This delivered an additional 10 Gy in 5 fractions using 12e electrons with a special teletherapy technique. The total dose was 60.4 Gy.  (6) Tamoxifen started 02/28/2019  (7) hidradenitis/boils: on doxycycline daily   PLAN: Amiliah is tolerating T-DM1 well and she will proceed to treatment today. I reviewed her CBC with her which is stable.  Her CMET is pending.  She has no clinical signs of recurrence today.    She is tolerating tamoxifen generally well and will continue taking this daily.  She was recommended to continue with the appropriate pandemic precautions. I will see her back in 3 weeks for labs, f/u and her next TDM1.  She knows to call for any questions or concerns that may arise between now and her next appointment.  A total of (20) minutes of face-to-face time was spent with  this patient with greater than 50% of that time in counseling and care-coordination.    Wilber Bihari, NP  05/03/19 9:36 AM Medical Oncology and Hematology [facility] [address] [place], [place] [address] Tel. [phone]    Fax. [phone]

## 2019-05-03 ENCOUNTER — Inpatient Hospital Stay: Payer: 59

## 2019-05-03 ENCOUNTER — Telehealth: Payer: Self-pay

## 2019-05-03 ENCOUNTER — Inpatient Hospital Stay (HOSPITAL_BASED_OUTPATIENT_CLINIC_OR_DEPARTMENT_OTHER): Payer: 59 | Admitting: Adult Health

## 2019-05-03 ENCOUNTER — Encounter: Payer: Self-pay | Admitting: Adult Health

## 2019-05-03 ENCOUNTER — Inpatient Hospital Stay: Payer: 59 | Attending: Oncology

## 2019-05-03 ENCOUNTER — Other Ambulatory Visit: Payer: Self-pay

## 2019-05-03 VITALS — BP 135/66 | HR 80 | Temp 98.5°F | Resp 18 | Ht 65.0 in | Wt 235.0 lb

## 2019-05-03 DIAGNOSIS — Z8042 Family history of malignant neoplasm of prostate: Secondary | ICD-10-CM

## 2019-05-03 DIAGNOSIS — N951 Menopausal and female climacteric states: Secondary | ICD-10-CM

## 2019-05-03 DIAGNOSIS — Z803 Family history of malignant neoplasm of breast: Secondary | ICD-10-CM

## 2019-05-03 DIAGNOSIS — C50211 Malignant neoplasm of upper-inner quadrant of right female breast: Secondary | ICD-10-CM

## 2019-05-03 DIAGNOSIS — Z79899 Other long term (current) drug therapy: Secondary | ICD-10-CM

## 2019-05-03 DIAGNOSIS — Z5112 Encounter for antineoplastic immunotherapy: Secondary | ICD-10-CM | POA: Insufficient documentation

## 2019-05-03 DIAGNOSIS — Z170 Estrogen receptor positive status [ER+]: Secondary | ICD-10-CM

## 2019-05-03 DIAGNOSIS — C50311 Malignant neoplasm of lower-inner quadrant of right female breast: Secondary | ICD-10-CM

## 2019-05-03 DIAGNOSIS — Z801 Family history of malignant neoplasm of trachea, bronchus and lung: Secondary | ICD-10-CM

## 2019-05-03 DIAGNOSIS — Z95828 Presence of other vascular implants and grafts: Secondary | ICD-10-CM

## 2019-05-03 DIAGNOSIS — Z87891 Personal history of nicotine dependence: Secondary | ICD-10-CM

## 2019-05-03 DIAGNOSIS — L732 Hidradenitis suppurativa: Secondary | ICD-10-CM

## 2019-05-03 DIAGNOSIS — C73 Malignant neoplasm of thyroid gland: Secondary | ICD-10-CM

## 2019-05-03 DIAGNOSIS — Z79810 Long term (current) use of selective estrogen receptor modulators (SERMs): Secondary | ICD-10-CM

## 2019-05-03 LAB — COMPREHENSIVE METABOLIC PANEL
ALT: 29 U/L (ref 0–44)
AST: 48 U/L — ABNORMAL HIGH (ref 15–41)
Albumin: 2.7 g/dL — ABNORMAL LOW (ref 3.5–5.0)
Alkaline Phosphatase: 88 U/L (ref 38–126)
Anion gap: 12 (ref 5–15)
BUN: 25 mg/dL — ABNORMAL HIGH (ref 6–20)
CO2: 27 mmol/L (ref 22–32)
Calcium: 8.3 mg/dL — ABNORMAL LOW (ref 8.9–10.3)
Chloride: 102 mmol/L (ref 98–111)
Creatinine, Ser: 1.55 mg/dL — ABNORMAL HIGH (ref 0.44–1.00)
GFR calc Af Amer: 45 mL/min — ABNORMAL LOW (ref >60)
GFR calc non Af Amer: 39 mL/min — ABNORMAL LOW (ref >60)
Glucose, Bld: 211 mg/dL — ABNORMAL HIGH (ref 70–99)
Potassium: 3.3 mmol/L — ABNORMAL LOW (ref 3.5–5.1)
Sodium: 141 mmol/L (ref 135–145)
Total Bilirubin: 0.2 mg/dL — ABNORMAL LOW (ref 0.3–1.2)
Total Protein: 7.8 g/dL (ref 6.5–8.1)

## 2019-05-03 LAB — CBC WITH DIFFERENTIAL/PLATELET
Abs Immature Granulocytes: 0.05 10*3/uL (ref 0.00–0.07)
Basophils Absolute: 0 10*3/uL (ref 0.0–0.1)
Basophils Relative: 0 %
Eosinophils Absolute: 0.1 10*3/uL (ref 0.0–0.5)
Eosinophils Relative: 2 %
HCT: 36.1 % (ref 36.0–46.0)
Hemoglobin: 12.1 g/dL (ref 12.0–15.0)
Immature Granulocytes: 1 %
Lymphocytes Relative: 24 %
Lymphs Abs: 1.6 10*3/uL (ref 0.7–4.0)
MCH: 30 pg (ref 26.0–34.0)
MCHC: 33.5 g/dL (ref 30.0–36.0)
MCV: 89.6 fL (ref 80.0–100.0)
Monocytes Absolute: 0.5 10*3/uL (ref 0.1–1.0)
Monocytes Relative: 8 %
Neutro Abs: 4.4 10*3/uL (ref 1.7–7.7)
Neutrophils Relative %: 65 %
Platelets: 177 10*3/uL (ref 150–400)
RBC: 4.03 MIL/uL (ref 3.87–5.11)
RDW: 17.5 % — ABNORMAL HIGH (ref 11.5–15.5)
WBC: 6.7 10*3/uL (ref 4.0–10.5)
nRBC: 0 % (ref 0.0–0.2)

## 2019-05-03 MED ORDER — ACETAMINOPHEN 325 MG PO TABS
ORAL_TABLET | ORAL | Status: AC
  Filled 2019-05-03: qty 2

## 2019-05-03 MED ORDER — ACETAMINOPHEN 325 MG PO TABS
650.0000 mg | ORAL_TABLET | Freq: Once | ORAL | Status: AC
  Administered 2019-05-03: 650 mg via ORAL

## 2019-05-03 MED ORDER — SODIUM CHLORIDE 0.9% FLUSH
10.0000 mL | INTRAVENOUS | Status: DC | PRN
  Administered 2019-05-03: 10 mL
  Filled 2019-05-03: qty 10

## 2019-05-03 MED ORDER — DIPHENHYDRAMINE HCL 25 MG PO CAPS
ORAL_CAPSULE | ORAL | Status: AC
  Filled 2019-05-03: qty 2

## 2019-05-03 MED ORDER — SODIUM CHLORIDE 0.9 % IV SOLN
360.0000 mg | Freq: Once | INTRAVENOUS | Status: AC
  Administered 2019-05-03: 360 mg via INTRAVENOUS
  Filled 2019-05-03: qty 10

## 2019-05-03 MED ORDER — SODIUM CHLORIDE 0.9 % IV SOLN
Freq: Once | INTRAVENOUS | Status: AC
  Administered 2019-05-03: 11:00:00 via INTRAVENOUS
  Filled 2019-05-03: qty 250

## 2019-05-03 MED ORDER — HEPARIN SOD (PORK) LOCK FLUSH 100 UNIT/ML IV SOLN
500.0000 [IU] | Freq: Once | INTRAVENOUS | Status: AC | PRN
  Administered 2019-05-03: 500 [IU]
  Filled 2019-05-03: qty 5

## 2019-05-03 MED ORDER — SODIUM CHLORIDE 0.9% FLUSH
10.0000 mL | INTRAVENOUS | Status: DC | PRN
  Administered 2019-05-03: 09:00:00 10 mL
  Filled 2019-05-03: qty 10

## 2019-05-03 MED ORDER — DIPHENHYDRAMINE HCL 25 MG PO CAPS
50.0000 mg | ORAL_CAPSULE | Freq: Once | ORAL | Status: AC
  Administered 2019-05-03: 50 mg via ORAL

## 2019-05-03 NOTE — Progress Notes (Signed)
Patient declines to wait the 30 minute wait period post Kadcyla infusion.

## 2019-05-03 NOTE — Patient Instructions (Signed)
[facility] Discharge Instructions for Patients Receiving Chemotherapy  Today you received the following chemotherapy agents Kadcyla  To help prevent nausea and vomiting after your treatment, we encourage you to take your nausea medication as directed   If you develop nausea and vomiting that is not controlled by your nausea medication, call the clinic.   BELOW ARE SYMPTOMS THAT SHOULD BE REPORTED IMMEDIATELY:  *FEVER GREATER THAN 100.5 F  *CHILLS WITH OR WITHOUT FEVER  NAUSEA AND VOMITING THAT IS NOT CONTROLLED WITH YOUR NAUSEA MEDICATION  *UNUSUAL SHORTNESS OF BREATH  *UNUSUAL BRUISING OR BLEEDING  TENDERNESS IN MOUTH AND THROAT WITH OR WITHOUT PRESENCE OF ULCERS  *URINARY PROBLEMS  *BOWEL PROBLEMS  UNUSUAL RASH Items with * indicate a potential emergency and should be followed up as soon as possible.  Feel free to call the clinic should you have any questions or concerns. The clinic phone number is [phone].  Please show the CHEMO ALERT CARD at check-in to the Emergency Department and triage nurse.   

## 2019-05-03 NOTE — Progress Notes (Signed)
Per Wilber Bihari, NP okay for patient to receive treatment today with creatine 1.55.

## 2019-05-03 NOTE — Telephone Encounter (Signed)
Spoke with patient to inform of low potassium lab result.  Per NP, increase potassium in diet.  Nurse gave patient several examples of potassium rich foods.  Patient voiced understanding.  No concerns at this time.

## 2019-05-08 ENCOUNTER — Other Ambulatory Visit: Payer: Self-pay

## 2019-05-08 DIAGNOSIS — R0602 Shortness of breath: Secondary | ICD-10-CM

## 2019-05-08 DIAGNOSIS — E876 Hypokalemia: Secondary | ICD-10-CM

## 2019-05-08 MED ORDER — POTASSIUM CHLORIDE ER 10 MEQ PO TBCR: 20.0000 meq | EXTENDED_RELEASE_TABLET | Freq: Two times a day (BID) | ORAL | 1 refills | Status: DC

## 2019-05-17 ENCOUNTER — Other Ambulatory Visit: Payer: Self-pay | Admitting: Oncology

## 2019-05-17 DIAGNOSIS — E876 Hypokalemia: Secondary | ICD-10-CM

## 2019-05-18 ENCOUNTER — Emergency Department (HOSPITAL_COMMUNITY)
Admission: EM | Admit: 2019-05-18 | Discharge: 2019-05-19 | Disposition: A | Discharge status: Home / Self Care | Payer: 59 | Attending: Emergency Medicine | Admitting: Emergency Medicine

## 2019-05-18 ENCOUNTER — Other Ambulatory Visit: Payer: Self-pay

## 2019-05-18 ENCOUNTER — Encounter (HOSPITAL_COMMUNITY): Payer: Self-pay | Admitting: Emergency Medicine

## 2019-05-18 DIAGNOSIS — Z87891 Personal history of nicotine dependence: Secondary | ICD-10-CM | POA: Insufficient documentation

## 2019-05-18 DIAGNOSIS — R42 Dizziness and giddiness: Secondary | ICD-10-CM | POA: Insufficient documentation

## 2019-05-18 DIAGNOSIS — Z7984 Long term (current) use of oral hypoglycemic drugs: Secondary | ICD-10-CM | POA: Diagnosis not present

## 2019-05-18 DIAGNOSIS — E119 Type 2 diabetes mellitus without complications: Secondary | ICD-10-CM | POA: Diagnosis not present

## 2019-05-18 DIAGNOSIS — N390 Urinary tract infection, site not specified: Secondary | ICD-10-CM | POA: Diagnosis not present

## 2019-05-18 DIAGNOSIS — B20 Human immunodeficiency virus [HIV] disease: Secondary | ICD-10-CM | POA: Insufficient documentation

## 2019-05-18 DIAGNOSIS — Z853 Personal history of malignant neoplasm of breast: Secondary | ICD-10-CM | POA: Diagnosis not present

## 2019-05-18 DIAGNOSIS — I10 Essential (primary) hypertension: Secondary | ICD-10-CM | POA: Diagnosis not present

## 2019-05-18 DIAGNOSIS — E860 Dehydration: Secondary | ICD-10-CM | POA: Insufficient documentation

## 2019-05-18 DIAGNOSIS — Z79899 Other long term (current) drug therapy: Secondary | ICD-10-CM | POA: Insufficient documentation

## 2019-05-18 LAB — BASIC METABOLIC PANEL
Anion gap: 10 (ref 5–15)
BUN: 33 mg/dL — ABNORMAL HIGH (ref 6–20)
CO2: 27 mmol/L (ref 22–32)
Calcium: 8.4 mg/dL — ABNORMAL LOW (ref 8.9–10.3)
Chloride: 102 mmol/L (ref 98–111)
Creatinine, Ser: 1.7 mg/dL — ABNORMAL HIGH (ref 0.44–1.00)
GFR calc Af Amer: 40 mL/min — ABNORMAL LOW (ref >60)
GFR calc non Af Amer: 34 mL/min — ABNORMAL LOW (ref >60)
Glucose, Bld: 109 mg/dL — ABNORMAL HIGH (ref 70–99)
Potassium: 2.9 mmol/L — ABNORMAL LOW (ref 3.5–5.1)
Sodium: 139 mmol/L (ref 135–145)

## 2019-05-18 LAB — CBC
HCT: 37.5 % (ref 36.0–46.0)
Hemoglobin: 12.1 g/dL (ref 12.0–15.0)
MCH: 29.9 pg (ref 26.0–34.0)
MCHC: 32.3 g/dL (ref 30.0–36.0)
MCV: 92.6 fL (ref 80.0–100.0)
Platelets: 164 10*3/uL (ref 150–400)
RBC: 4.05 MIL/uL (ref 3.87–5.11)
RDW: 18 % — ABNORMAL HIGH (ref 11.5–15.5)
WBC: 6.8 10*3/uL (ref 4.0–10.5)
nRBC: 0 % (ref 0.0–0.2)

## 2019-05-18 MED ORDER — SODIUM CHLORIDE 0.9% FLUSH
3.0000 mL | Freq: Once | INTRAVENOUS | Status: AC
  Administered 2019-05-19: 01:00:00 3 mL via INTRAVENOUS

## 2019-05-18 NOTE — ED Triage Notes (Signed)
Pt reports having nausea and dizziness since Thursday and abdominal discomfort. Pt currently under chemo. Last treatment was July 17.

## 2019-05-19 LAB — URINALYSIS, ROUTINE W REFLEX MICROSCOPIC
Bacteria, UA: NONE SEEN
Bilirubin Urine: NEGATIVE
Glucose, UA: NEGATIVE mg/dL
Ketones, ur: NEGATIVE mg/dL
Nitrite: NEGATIVE
Protein, ur: >=300 mg/dL — AB
Specific Gravity, Urine: 1.013 (ref 1.005–1.030)
WBC, UA: >50 WBC/hpf — ABNORMAL HIGH (ref 0–5)
pH: 6 (ref 5.0–8.0)

## 2019-05-19 LAB — CBG MONITORING, ED: Glucose-Capillary: 98 mg/dL (ref 70–99)

## 2019-05-19 MED ORDER — SODIUM CHLORIDE 0.9 % IV BOLUS
1000.0000 mL | Freq: Once | INTRAVENOUS | Status: AC
  Administered 2019-05-19: 1000 mL via INTRAVENOUS

## 2019-05-19 MED ORDER — POTASSIUM CHLORIDE 10 MEQ/100ML IV SOLN
10.0000 meq | Freq: Once | INTRAVENOUS | Status: AC
  Administered 2019-05-19: 01:00:00 10 meq via INTRAVENOUS
  Filled 2019-05-19: qty 100

## 2019-05-19 MED ORDER — HYDROCODONE-ACETAMINOPHEN 5-325 MG PO TABS
2.0000 | ORAL_TABLET | Freq: Once | ORAL | Status: AC
  Administered 2019-05-19: 2 via ORAL
  Filled 2019-05-19: qty 2

## 2019-05-19 MED ORDER — SODIUM CHLORIDE 0.9 % IV SOLN
1.0000 g | Freq: Once | INTRAVENOUS | Status: AC
  Administered 2019-05-19: 02:00:00 1 g via INTRAVENOUS
  Filled 2019-05-19: qty 10

## 2019-05-19 MED ORDER — CEPHALEXIN 500 MG PO CAPS: 500.0000 mg | ORAL_CAPSULE | Freq: Three times a day (TID) | ORAL | 0 refills | Status: DC

## 2019-05-19 MED ORDER — HEPARIN SOD (PORK) LOCK FLUSH 100 UNIT/ML IV SOLN
500.0000 [IU] | Freq: Once | INTRAVENOUS | Status: AC
  Administered 2019-05-19: 03:00:00 500 [IU]
  Filled 2019-05-19: qty 5

## 2019-05-19 NOTE — ED Provider Notes (Signed)
[facility] DEPT Provider Note   CSN: [id number] Arrival date & time: 05/18/19  2125     History   Chief Complaint Chief Complaint  Patient presents with  . Dizziness    HPI [name] is a 53 y.o. female.     Patient is a 53 year old female with past medical history of breast cancer currently undergoing chemotherapy, HIV disease, asthma.  She presents today for evaluation of weakness and dizziness.  This is been ongoing for the past 2 days.  She states she feels lightheaded when she tries to walk.  She denies any fevers or chills.  She denies any chest pain or cough.  She does report decreased appetite and decreased p.o. intake since her most recent round of chemotherapy.  The history is provided by the patient.  Dizziness Quality:  Lightheadedness Severity:  Moderate Onset quality:  Gradual Duration:  2 days Timing:  Constant Progression:  Worsening Chronicity:  New Context: physical activity and standing up   Relieved by:  Nothing Worsened by:  Nothing Ineffective treatments:  None tried   Past Medical History:  Diagnosis Date  . Allergy   . Anemia    Normocytic  . Anxiety   . Asthma   . Blood dyscrasia   . Bronchitis 2005  . 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 Diabetes mellitus without complication ([place])   . Family history of breast cancer   . Family history of lung cancer   . Family history of prostate cancer   . Gastroenteritis 07/10/07  . GERD 07/24/2006  . GRAVE'S DISEASE 01/01/2008  . History of hidradenitis suppurativa   . History of kidney stones   . History of thrush   . HIV DISEASE 07/24/2006   dx March 05  . Hyperlipidemia   . HYPERTENSION 07/24/2006  . Hyperthyroidism 08/2006   Grave's Disease -diffuse radiotracer uptake 08/25/06 Thyroid scan-Cold nodule to R lower lobe of thyrorid  . Menometrorrhagia    hx of  . Nephrolithiasis   . 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 Pneumonia 2005  . 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 Sarcoidosis 02/08/2007   dx as a teenager in [place] from abnl CXR. Completed 2 yrs Prednisone after lung bx confirmation. No symptoms since then.  . Suppurative hidradenitis   . Thyroid cancer ([place])   . THYROID NODULE, RIGHT 02/08/2007    Patient Active Problem List   Diagnosis Date Noted  . Preventative health care 09/10/2018  . Genetic testing 07/25/2018  . Port-A-Cath in place 07/12/2018  . Family history of breast cancer   . Family history of prostate cancer   . Family history of lung cancer   . Malignant neoplasm of lower-inner quadrant of right breast of female, estrogen receptor positive ([place]) 05/10/2018  . DOE (dyspnea on exertion) 10/13/2017  . Hyperlipidemia 04/26/2017  . Chronic anxiety 04/11/2017  . Upper airway cough syndrome 11/11/2016  . Morbid (severe) obesity due to excess calories ([place]) 10/14/2016  . Shortness of breath 10/13/2016  . Allergic rhinitis 02/18/2016  . Right shoulder pain 02/18/2016  . Non-insulin dependent type 2 diabetes mellitus ([place]) 06/23/2015  . Renal calculi 10/29/2014  . Recurrent boils 06/11/2014  . Abscess of breast, right 01/09/2013  . Hidradenitis suppurativa of right >> left axilla 01/30/2012  . 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 SARCOIDOSIS 02/08/2007  . Cigarette smoker 02/08/2007  . Human immunodeficiency virus (HIV) disease ([place]) 07/24/2006  . Depression 07/24/2006  .  Essential hypertension 07/24/2006  . Asthma 07/24/2006  . GERD 07/24/2006    Past Surgical History:  Procedure Laterality Date  . BREAST LUMPECTOMY WITH RADIOACTIVE SEED AND SENTINEL LYMPH NODE BIOPSY Right 10/03/2018   Procedure: RIGHT BREAST LUMPECTOMY WITH RADIOACTIVE SEED AND SENTINEL LYMPH NODE MAPPING;  Surgeon: Erroll Luna, MD;  Location: [facility];  Service: General;  Laterality: Right;  . BREAST SURGERY  1997   Breast Reduction   .  CYSTOSCOPY W/ URETERAL STENT REMOVAL  11/09/2012   Procedure: CYSTOSCOPY WITH STENT REMOVAL;  Surgeon: Alexis Frock, MD;  Location: [facility];  Service: Urology;  Laterality: Right;  . CYSTOSCOPY WITH RETROGRADE PYELOGRAM, URETEROSCOPY AND STENT PLACEMENT  11/09/2012   Procedure: CYSTOSCOPY WITH RETROGRADE PYELOGRAM, URETEROSCOPY AND STENT PLACEMENT;  Surgeon: Alexis Frock, MD;  [facility];  Service: Urology;  Laterality: Left;  LEFT URETEROSCOPY, STONE MANIPULATION, left STENT exchange   . CYSTOSCOPY WITH STENT PLACEMENT  10/02/2012   Procedure: CYSTOSCOPY WITH STENT PLACEMENT;  Surgeon: Alexis Frock, MD;  [facility];  Service: Urology;  Laterality: Left;  . DILATION AND CURETTAGE OF UTERUS  Feb 2004   s/p for 1st trimester nonviable pregnancy  . EYE SURGERY     sty under eyelid  . INCISE AND DRAIN ABCESS  Nov 03   s/p I &D for righ inframmary fold hidradenitis  . INCISION AND DRAINAGE PERITONSILLAR ABSCESS  Mar 03  . IR CV LINE INJECTION  06/07/2018  . IR IMAGING GUIDED PORT INSERTION  06/20/2018  . IR REMOVAL TUN ACCESS W/ PORT W/O FL MOD SED  06/20/2018  . IRRIGATION AND DEBRIDEMENT ABSCESS  01/31/2012   Procedure: IRRIGATION AND DEBRIDEMENT ABSCESS;  Surgeon: Shann Medal, MD;  [facility];  Service: General;  Laterality: Right;  right breast and axilla   . NEPHROLITHOTOMY  10/02/2012   Procedure: NEPHROLITHOTOMY PERCUTANEOUS;  Surgeon: Alexis Frock, MD;  [facility];  Service: Urology;  Laterality: Right;  First Stage Percutaneous Nephrolithotomy with Surgeon Access, Left Ureteral Stent    . NEPHROLITHOTOMY  10/04/2012   Procedure: NEPHROLITHOTOMY PERCUTANEOUS SECOND LOOK;  Surgeon: Alexis Frock, MD;  [facility];  Service: Urology;  Laterality: Right;     . NEPHROLITHOTOMY  10/08/2012   Procedure: NEPHROLITHOTOMY PERCUTANEOUS;  Surgeon: Alexis Frock, MD;  [facility];  Service: Urology;  Laterality: Right;  THIRD STAGE, nephrostomy tube exchange x 2   . NEPHROLITHOTOMY  10/11/2012   Procedure: NEPHROLITHOTOMY PERCUTANEOUS SECOND LOOK;  Surgeon: Alexis Frock, MD;  [facility];  Service: Urology;  Laterality: Right;  RIGHT 4 STAGE PERCUTANOUS NEPHROLITHOTOMY, right URETEROSCOPY WITH HOLMIUM LASER   . PORTACATH PLACEMENT Left 05/17/2018   Procedure: INSERTION PORT-A-CATH;  Surgeon: Coralie Keens, MD;  Location: [facility];  Service: General;  Laterality: Left;  . RADICAL NECK DISSECTION  04/12/2017   limited/notes 04/12/2017  . RADICAL NECK DISSECTION N/A 04/12/2017   Procedure: RADICAL NECK DISSECTION;  Surgeon: Melida Quitter, MD;  Location: [facility];  Service: ENT;  Laterality: N/A;  limited neck dissection 2 hours total  . REDUCTION MAMMAPLASTY Bilateral 1998  . [place]  1994  . THYROIDECTOMY  04/12/2017   completion/notes 04/12/2017  . THYROIDECTOMY N/A 04/12/2017   Procedure: THYROIDECTOMY;  Surgeon: Melida Quitter, MD;  Location: [facility];  Service: ENT;  Laterality: N/A;  Completion Thyroidectomy  . TOTAL THYROIDECTOMY  2010     OB History   No obstetric history on file.      Home Medications  Prior to Admission medications   Medication Sig Start Date End Date Taking? Authorizing Provider  amLODipine (NORVASC) 10 MG tablet Take 1 tablet (10 mg total) by mouth daily. For blood pressure. 08/24/18   [name], NP  atorvastatin (LIPITOR) 20 MG tablet Take 1 tablet by mouth every evening for high cholesterol. NEED APPOINTMENT FOR ANY MORE REFILLS Patient taking differently: Take 20 mg by mouth every evening.  04/30/18   [name], NP  dolutegravir ([name]) 50 MG tablet Take 1 tablet (50 mg total) by mouth daily. 05/28/18   Michel Bickers, MD  doxycycline (VIBRA-TABS) 100 MG tablet 100 mg daily.  10/29/18   [provider]  emtricitabine-tenofovir AF (DESCOVY) 200-25 MG tablet Take 1 tablet by mouth daily. 05/28/18   Michel Bickers, MD  fluconazole (DIFLUCAN) 100 MG tablet 2 tablets for first  dose then 1 tab a day x 2 days 03/04/19   Magrinat, Virgie Dad, MD  irbesartan-hydrochlorothiazide (AVALIDE) 150-12.5 MG tablet TAKE 1 TABLET BY MOUTH ONCE DAILY FOR BLOOD PRESSURE. 12/25/18   [name], NP  levothyroxine (SYNTHROID) 175 MCG tablet TAKE 1 TABLET BY MOUTH EVERY MORNING ON AN EMPTY STOMACH WITH A FULL GLASS OF WATER. 03/27/19   [name], NP  metFORMIN (GLUCOPHAGE) 500 MG tablet Take 1 tablet (500 mg total) by mouth 2 (two) times daily with a meal. 08/31/18   [name], NP  metoprolol succinate (TOPROL-XL) 50 MG 24 hr tablet TAKE 1 TABLET (50 MG TOTAL) BY MOUTH DAILY. TAKE WITH OR IMMEDIATELY FOLLOWING A MEAL. 03/27/19   [name], NP  mometasone (NASONEX) 50 MCG/ACT nasal spray PLACE 2 SPRAYS INTO THE NOSE DAILY. 06/23/17   [name], NP  Multiple Vitamin (MULTIVITAMIN) tablet Take 1 tablet by mouth daily.    [provider]  ONE TOUCH LANCETS MISC Use as instructed to test blood sugar daily 06/08/17   [name], NP  [employer] [facility] [employer] VERIO test strip USE AS INSTRUCTED TO TEST BLOOD SUGAR DAILY NEED APPOINTMENT FOR ANY MORE REFILLS 08/21/18   [name], NP  potassium chloride (K-DUR) 10 MEQ tablet Take 2 tablets (20 mEq total) by mouth 2 (two) times daily. 05/08/19   Magrinat, Virgie Dad, MD  PROAIR [employer]PICLICK [phone] Base) MCG/ACT AEPB INHALE 2 PUFFS INTO THE LUNGS EVERY 6 HOURS AS NEEDED FOR WHEEZING OR SHORTNESS OF BREATH. 09/28/17   [name], NP  spironolactone (ALDACTONE) 25 MG tablet Take 1 tablet (25 mg total) by mouth 2 (two) times daily. 10/25/18   Hayden Pedro, PA-C  tamoxifen (NOLVADEX) 20 MG tablet Take 1 tablet (20 mg total) by mouth daily. 02/28/19   Gardenia Phlegm, NP  prochlorperazine (COMPAZINE) 10 MG tablet TAKE 1 TABLET BY MOUTH EVERY 6 HOURS AS NEEDED FOR NAUSEA OR VOMITING 08/24/18 12/27/18  Magrinat, Virgie Dad, MD    Family History Family History  Problem Relation Age of Onset  .  Hypertension Mother   . Cancer Mother        laryngeal  . Heart disease Mother        stent  . Hypertension Father   . Lung cancer Father 73       hx smoking  . Heart disease Other   . Hypertension Other   . Stroke Other        Grandparent  . Kidney disease Other        Grandparent  . Diabetes Other  FH of Diabetes  . Hypertension Sister   . Cancer Maternal Uncle        Lung CA  . Hypertension Brother   . Hypertension Sister   . Breast cancer Maternal Aunt 65  . Breast cancer Paternal Aunt 65  . Prostate cancer Paternal Uncle   . Breast cancer Maternal Aunt        dx 60+  . Breast cancer Paternal Aunt        dx 58's  . Breast cancer Paternal Aunt        dx 72's  . Prostate cancer Paternal Uncle   . Lung cancer Paternal Uncle   . Breast cancer Cousin 8  . Breast cancer Cousin        dx <50  . Breast cancer Cousin        dx <50  . Breast cancer Cousin        dx <50  . Colon cancer Neg Hx   . Esophageal cancer Neg Hx   . Rectal cancer Neg Hx   . Stomach cancer Neg Hx     Social History Social History   Tobacco Use  . Smoking status: Former Smoker    Packs/day: 0.50    Years: 15.00    Pack years: 7.50    Types: Cigarettes    Start date: 04/12/2017  . Smokeless tobacco: Never Used  . Tobacco comment: quit smoking  may 2018  Substance Use Topics  . Alcohol use: Yes    Alcohol/week: 0.0 standard drinks    Comment: social  . Drug use: No     Allergies   Genvoya [elviteg-cobic-emtricit-tenofaf], Lisinopril, and Valsartan   Review of Systems Review of Systems  Neurological: Positive for dizziness.     Physical Exam Updated Vital Signs BP 94/72 (BP Location: Left Arm)   Pulse 71   Temp 98.6 F (37 C) (Oral)   Resp 11   Ht 5\' 5"  (1.651 m)   Wt 105.2 kg   LMP 03/31/2014 Comment: Negative Serum HCG 06/07/17  SpO2 98%   BMI 38.61 kg/m   Physical Exam Vitals signs and nursing note reviewed.  Constitutional:      General: She is not in  acute distress.    Appearance: She is well-developed. She is not diaphoretic.  HENT:     Head: Normocephalic and atraumatic.  Neck:     Musculoskeletal: Normal range of motion and neck supple.  Cardiovascular:     Rate and Rhythm: Normal rate and regular rhythm.     Heart sounds: No murmur. No friction rub. No gallop.   Pulmonary:     Effort: Pulmonary effort is normal. No respiratory distress.     Breath sounds: Normal breath sounds. No wheezing.  Abdominal:     General: Bowel sounds are normal. There is no distension.     Palpations: Abdomen is soft.     Tenderness: There is no abdominal tenderness.  Musculoskeletal: Normal range of motion.  Skin:    General: Skin is warm and dry.  Neurological:     General: No focal deficit present.     Mental Status: She is alert and oriented to person, place, and time.     Cranial Nerves: No cranial nerve deficit.     Sensory: No sensory deficit.     Motor: No weakness.      ED Treatments / Results  Labs (all labs ordered are listed, but only abnormal results are displayed) Labs Reviewed  BASIC METABOLIC  PANEL - Abnormal; Notable for the following components:      Result Value   Potassium 2.9 (*)    Glucose, Bld 109 (*)    BUN 33 (*)    Creatinine, Ser 1.70 (*)    Calcium 8.4 (*)    GFR calc non Af Amer 34 (*)    GFR calc Af Amer 40 (*)    All other components within normal limits  CBC - Abnormal; Notable for the following components:   RDW 18.0 (*)    All other components within normal limits  URINALYSIS, ROUTINE W REFLEX MICROSCOPIC  CBG MONITORING, ED    EKG EKG Interpretation  Date/Time:  Saturday May 18 2019 23:49:15 EDT Ventricular Rate:  68 PR Interval:    QRS Duration: 101 QT Interval:  518 QTC Calculation: 551 R Axis:   74 Text Interpretation:  Sinus rhythm Borderline T abnormalities, inferior leads Prolonged QT interval Confirmed by Veryl Speak [phone]) on 05/19/2019 12:02:52 AM   Radiology No results  found.  Procedures Procedures (including critical care time)  Medications Ordered in ED Medications  sodium chloride flush (NS) 0.9 % injection 3 mL (has no administration in time range)  sodium chloride 0.9 % bolus 1,000 mL (has no administration in time range)  potassium chloride 10 mEq in 100 mL IVPB (has no administration in time range)     Initial Impression / Assessment and Plan / ED Course  I have reviewed the triage vital signs and the nursing notes.  Pertinent labs & imaging results that were available during my care of the patient were reviewed by me and considered in my medical decision making (see chart for details).  Patient presenting here with complaints of dizziness.  Patient with history of breast cancer receiving chemotherapy.  Her CBC is unremarkable and the patient is not neutropenic.  Electrolytes show possibly mild dehydration.  Her potassium is low at 2.9 and was replaced here in the ER.  Patient given IV fluids along with IV potassium and Rocephin for UTI.  She will be discharged with Keflex and return as needed.  Final Clinical Impressions(s) / ED Diagnoses   Final diagnoses:  None    ED Discharge Orders    None       Veryl Speak, MD 05/19/19 [phone]

## 2019-05-19 NOTE — Discharge Instructions (Addendum)
Keflex as prescribed.  Drink plenty of fluids and get plenty of rest.  Return to the emergency department if your symptoms significantly worsen or change.

## 2019-05-20 ENCOUNTER — Telehealth: Payer: Self-pay | Admitting: *Deleted

## 2019-05-20 ENCOUNTER — Other Ambulatory Visit: Payer: Self-pay

## 2019-05-20 MED ORDER — TRAMADOL HCL 50 MG PO TABS: 50.0000 mg | ORAL_TABLET | Freq: Four times a day (QID) | ORAL | 0 refills | Status: DC | PRN

## 2019-05-20 NOTE — Telephone Encounter (Addendum)
"  [name] [phone]). Last night ED said I have a UTI and dehydrated.  I can't eat.  I need to see Dr. [name], [place] or someone today.  I believe I have anxiety, depressed or something and need to be taken out of work.    Yes, I have the F.M.L.A form.  I just can't do this anymore."  Emotional conversation, crying yet articulating well.

## 2019-05-20 NOTE — Telephone Encounter (Signed)
Please see if patient can wait until Friday to meet with [place], or if we need to see her sooner.  Thanks, [place]

## 2019-05-21 ENCOUNTER — Encounter: Payer: Self-pay | Admitting: *Deleted

## 2019-05-21 NOTE — Telephone Encounter (Signed)
I spoke with [name]- she states she is feeling " overwhelmed mentally and emotionally and needs medical leave "  Per discussion- she has taken intermittant FMLA- and for her to keep her benefits and position at work she would need to take her long term benefits with last day of work as 05/18/2019  Per phone discussion- [name] states she is " not sleeping, not eating and just want to cry all the time "  This RN discussed current treatment and issues that medically we can write her medical leave for completion and recovery from treatment.  Darrin stated appreciation of above plan.  This RN gave [name] fax number for FMLA to be sent.

## 2019-05-21 NOTE — Telephone Encounter (Signed)
Is she ok to wait until her appointment on Friday, or do we need to see her sooner?

## 2019-05-23 ENCOUNTER — Telehealth: Payer: Self-pay | Admitting: *Deleted

## 2019-05-23 ENCOUNTER — Encounter: Payer: Self-pay | Admitting: General Practice

## 2019-05-23 NOTE — Telephone Encounter (Signed)
This RN spoke with [name] this AM to follow up on taking long term disability. [name] states she has spoken with her HR dept and has been told she only has 3 weeks left for her FMLA benefits.  " I am told if I go beyond that then they will terminate my position and I could loose my benefits "  [name] has short term and long term benefits under [employer] [place].  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 " But living on site- the work really never stops and I am just overwhelmed and need to take a break and recover and get my physical and mental health stronger "  [name] is scheduled to come in Friday 05/24/2019 for visit and treatment.  Provider is aware of above with goal to assist pt in leave with ability to keep her benefits and return to her current position.  [name] states reassurance that this office is willing to assist her. She has not spoken with any social workers at this office per resources for her.  Plan at present is pt will come in as scheduled- discuss issue with provider as well as this RN requested consult from our Social Work dept.  [name] is in agreement to above.  No further needs at this time.

## 2019-05-23 NOTE — Progress Notes (Signed)
[facility] CSW Progress Notes  Referral received from [facility] to call patient to offer support/resources re workplace issues.  Spoke w patient by phone, pt states that her workplace has not given reasonable accommodations to her in context of her current diagnosis/treatment/reactions to cancer.  Has had increasing difficulty w multiple symptoms at work (exhaustion, breathlessness, anxiety, GI distress, pain), fainted at work this weekend and went to ED for dehydration.  Has exhausted all but 3 weeks and 4 days of FMLA per her HR.  Has both short and long term disability and submitted paperwork on 8/4.   Discussed ways to get more information about how to stabilize her situation, referred to Cancer and Careers.  Sent email w resources for outside financial assistance and various free programs available at [facility] and [facility] for more support.  Will meet w her tomorrow during infusion.  Edwyna Shell, LCSW Clinical Social Worker Phone:  [phone]

## 2019-05-24 ENCOUNTER — Other Ambulatory Visit: Payer: Self-pay | Admitting: Adult Health

## 2019-05-24 ENCOUNTER — Inpatient Hospital Stay (HOSPITAL_BASED_OUTPATIENT_CLINIC_OR_DEPARTMENT_OTHER): Payer: 59 | Admitting: Adult Health

## 2019-05-24 ENCOUNTER — Encounter: Payer: Self-pay | Admitting: Adult Health

## 2019-05-24 ENCOUNTER — Inpatient Hospital Stay: Payer: 59 | Admitting: General Practice

## 2019-05-24 ENCOUNTER — Inpatient Hospital Stay: Payer: 59

## 2019-05-24 ENCOUNTER — Other Ambulatory Visit: Payer: Self-pay

## 2019-05-24 ENCOUNTER — Inpatient Hospital Stay: Payer: 59 | Attending: Oncology

## 2019-05-24 ENCOUNTER — Telehealth (HOSPITAL_COMMUNITY): Payer: Self-pay | Admitting: Vascular Surgery

## 2019-05-24 ENCOUNTER — Encounter: Payer: Self-pay | Admitting: General Practice

## 2019-05-24 ENCOUNTER — Ambulatory Visit (HOSPITAL_COMMUNITY)
Admission: RE | Admit: 2019-05-24 | Discharge: 2019-05-24 | Disposition: A | Discharge status: Home / Self Care | Payer: 59 | Source: Ambulatory Visit | Attending: Adult Health | Admitting: Adult Health

## 2019-05-24 VITALS — BP 129/85 | HR 84 | Temp 98.7°F | Resp 20 | Ht 65.0 in | Wt 234.7 lb

## 2019-05-24 DIAGNOSIS — C50311 Malignant neoplasm of lower-inner quadrant of right female breast: Secondary | ICD-10-CM | POA: Diagnosis not present

## 2019-05-24 DIAGNOSIS — Z170 Estrogen receptor positive status [ER+]: Secondary | ICD-10-CM | POA: Diagnosis not present

## 2019-05-24 DIAGNOSIS — Z801 Family history of malignant neoplasm of trachea, bronchus and lung: Secondary | ICD-10-CM | POA: Diagnosis not present

## 2019-05-24 DIAGNOSIS — R0602 Shortness of breath: Secondary | ICD-10-CM | POA: Insufficient documentation

## 2019-05-24 DIAGNOSIS — Z5112 Encounter for antineoplastic immunotherapy: Secondary | ICD-10-CM | POA: Diagnosis not present

## 2019-05-24 DIAGNOSIS — B373 Candidiasis of vulva and vagina: Secondary | ICD-10-CM | POA: Insufficient documentation

## 2019-05-24 DIAGNOSIS — Z8042 Family history of malignant neoplasm of prostate: Secondary | ICD-10-CM | POA: Insufficient documentation

## 2019-05-24 DIAGNOSIS — Z923 Personal history of irradiation: Secondary | ICD-10-CM | POA: Diagnosis not present

## 2019-05-24 DIAGNOSIS — Z7984 Long term (current) use of oral hypoglycemic drugs: Secondary | ICD-10-CM | POA: Insufficient documentation

## 2019-05-24 DIAGNOSIS — J45909 Unspecified asthma, uncomplicated: Secondary | ICD-10-CM | POA: Diagnosis not present

## 2019-05-24 DIAGNOSIS — E109 Type 1 diabetes mellitus without complications: Secondary | ICD-10-CM | POA: Diagnosis not present

## 2019-05-24 DIAGNOSIS — Z79810 Long term (current) use of selective estrogen receptor modulators (SERMs): Secondary | ICD-10-CM | POA: Insufficient documentation

## 2019-05-24 DIAGNOSIS — Z803 Family history of malignant neoplasm of breast: Secondary | ICD-10-CM | POA: Insufficient documentation

## 2019-05-24 DIAGNOSIS — N951 Menopausal and female climacteric states: Secondary | ICD-10-CM | POA: Insufficient documentation

## 2019-05-24 DIAGNOSIS — Z9011 Acquired absence of right breast and nipple: Secondary | ICD-10-CM | POA: Diagnosis not present

## 2019-05-24 DIAGNOSIS — C73 Malignant neoplasm of thyroid gland: Secondary | ICD-10-CM

## 2019-05-24 DIAGNOSIS — Z95828 Presence of other vascular implants and grafts: Secondary | ICD-10-CM

## 2019-05-24 DIAGNOSIS — F419 Anxiety disorder, unspecified: Secondary | ICD-10-CM | POA: Diagnosis not present

## 2019-05-24 DIAGNOSIS — M545 Low back pain: Secondary | ICD-10-CM | POA: Diagnosis not present

## 2019-05-24 DIAGNOSIS — C50211 Malignant neoplasm of upper-inner quadrant of right female breast: Secondary | ICD-10-CM | POA: Diagnosis present

## 2019-05-24 DIAGNOSIS — Z794 Long term (current) use of insulin: Secondary | ICD-10-CM | POA: Diagnosis not present

## 2019-05-24 DIAGNOSIS — L732 Hidradenitis suppurativa: Secondary | ICD-10-CM | POA: Diagnosis not present

## 2019-05-24 DIAGNOSIS — Z79899 Other long term (current) drug therapy: Secondary | ICD-10-CM | POA: Diagnosis not present

## 2019-05-24 DIAGNOSIS — Z87891 Personal history of nicotine dependence: Secondary | ICD-10-CM | POA: Insufficient documentation

## 2019-05-24 LAB — COMPREHENSIVE METABOLIC PANEL
ALT: 30 U/L (ref 0–44)
AST: 46 U/L — ABNORMAL HIGH (ref 15–41)
Albumin: 2.9 g/dL — ABNORMAL LOW (ref 3.5–5.0)
Alkaline Phosphatase: 89 U/L (ref 38–126)
Anion gap: 12 (ref 5–15)
BUN: 28 mg/dL — ABNORMAL HIGH (ref 6–20)
CO2: 28 mmol/L (ref 22–32)
Calcium: 9.4 mg/dL (ref 8.9–10.3)
Chloride: 101 mmol/L (ref 98–111)
Creatinine, Ser: 1.96 mg/dL — ABNORMAL HIGH (ref 0.44–1.00)
GFR calc Af Amer: 33 mL/min — ABNORMAL LOW (ref >60)
GFR calc non Af Amer: 29 mL/min — ABNORMAL LOW (ref >60)
Glucose, Bld: 171 mg/dL — ABNORMAL HIGH (ref 70–99)
Potassium: 3.3 mmol/L — ABNORMAL LOW (ref 3.5–5.1)
Sodium: 141 mmol/L (ref 135–145)
Total Bilirubin: <0.2 mg/dL — ABNORMAL LOW (ref 0.3–1.2)
Total Protein: 8.1 g/dL (ref 6.5–8.1)

## 2019-05-24 LAB — CBC WITH DIFFERENTIAL/PLATELET
Abs Immature Granulocytes: 0.08 10*3/uL — ABNORMAL HIGH (ref 0.00–0.07)
Basophils Absolute: 0 10*3/uL (ref 0.0–0.1)
Basophils Relative: 0 %
Eosinophils Absolute: 0.1 10*3/uL (ref 0.0–0.5)
Eosinophils Relative: 1 %
HCT: 38 % (ref 36.0–46.0)
Hemoglobin: 12.6 g/dL (ref 12.0–15.0)
Immature Granulocytes: 1 %
Lymphocytes Relative: 21 %
Lymphs Abs: 1.5 10*3/uL (ref 0.7–4.0)
MCH: 30.3 pg (ref 26.0–34.0)
MCHC: 33.2 g/dL (ref 30.0–36.0)
MCV: 91.3 fL (ref 80.0–100.0)
Monocytes Absolute: 0.5 10*3/uL (ref 0.1–1.0)
Monocytes Relative: 7 %
Neutro Abs: 4.9 10*3/uL (ref 1.7–7.7)
Neutrophils Relative %: 70 %
Platelets: 176 10*3/uL (ref 150–400)
RBC: 4.16 MIL/uL (ref 3.87–5.11)
RDW: 17.8 % — ABNORMAL HIGH (ref 11.5–15.5)
WBC: 7 10*3/uL (ref 4.0–10.5)
nRBC: 0 % (ref 0.0–0.2)

## 2019-05-24 MED ORDER — SODIUM CHLORIDE 0.9% FLUSH
10.0000 mL | INTRAVENOUS | Status: DC | PRN
  Administered 2019-05-24: 10 mL
  Filled 2019-05-24: qty 10

## 2019-05-24 NOTE — Progress Notes (Signed)
[facility]  [phone] [phone]    ID: [name] DOB: 08/06/1966  MR#: [id number]  FTD#:[id number]  Patient Care Team: [name], NP as PCP - General (Internal Medicine) Michel Bickers, MD as PCP - Infectious Diseases (Infectious Diseases) Magrinat, Virgie Dad, MD as Consulting Physician (Oncology) Kyung Rudd, MD as Consulting Physician (Radiation Oncology) OTHER MD:   CHIEF COMPLAINT: Triple positive breast cancer  CURRENT TREATMENT: Adjuvant immunotherapy/adjuvant radiation   INTERVAL HISTORY: [name] returns today for follow-up and treatment of her triple positive breast cancer.  She continues on adjuvant TDM1. Today is day 1 cycle 8.   She also continues on tamoxifen.  She has hot flashes, and they are persistent.  She has no vaginal discharge or arthralgias.    Danella's last echocardiogram on 03/06/2019, showed an ejection fraction of 50%. She sees Dr. Haroldine Laws regularly and will see him again in 05/2019.    REVIEW OF SYSTEMS: Lauranne has not been feeling well lately.  She notes that she is overwhelmed mentally with work.  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  She has had periods of light headedness and had a syncopal episode the other week, due to dehydration.  The new Secondary school teacher was overheard stating that she wants [name] gone.    Colie is tearful, she is anxious frequently, she is nauseated and is having a hard time eating and drinking. She is having difficulty sleeping and is tearful.  She notes that she wants to give up (not suicidal ideation), but stopping everything outside of her home: work, treatment, etc.  She was notified that if she didn't return to work by 06/13/2019 her position would be terminated.  Kalasia is meeting with Edwyna Shell in social work today.     Shritha is unable to perform her job duties due to anxiety/depression.  She has also noted some pain at her right mastectomy site, and radiates posteriorly.  She notes this was exacerbated as her job duties at work increased and as her Secondary school teacher was screaming at her.   She also has some lower back pain, she wonders if it is stress related.  She was given vicodin for this pain at the hospital.    [name] notes an increase in her shortness of breath.  It is worsening with exertion.  Her last echo on 5/20 showed an EF of 50%.  She is due to see Dr. Haroldine Laws later this month.  Otherwise a  Detailed ROS was non contributory today.    HISTORY OF CURRENT ILLNESS: From the original intake note:  [name] (pronounced "Huff") palpated a mass in the right breast for about 2 weeks before bringing it to medical attention. She underwent bilateral diagnostic mammography with tomography and right breast ultrasonography at [facility] on 04/20/2018 showing: breast density category B. There was a highly suspicious mass located at 3 o'clock in the upper inner quadrant measuring 2.9 x 2.5 x 1.6 cm and 4 cm from the nipple. Ultrasound of the right axilla showed 5 lymph nodes with abnormal cortical thickening. The left axilla showed 3 lymph nodes with abnormal cortical thickening.  Accordingly on 04/26/2018 she proceeded to biopsy of the right breast area in question. The pathology from this procedure showed ([id number]): Invasive ductal carcinoma, grade II with calcifications. One right axillary lymph node and  one left axillary lymph node were negative for carcinoma. Prognostic indicators significant for: estrogen receptor, 100% positive and progesterone receptor, 80% positive, both with strong staining intensity. Proliferation marker Ki67 at 70%. HER2 amplified with ratios HER2/CEP17 signals 4.52 and average HER2 copies per cell 10.85  The patient's subsequent history is as detailed  below.  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   Diabetes mellitus without complication ([place])    Family history of breast cancer    Family history of lung cancer    Family history of prostate cancer    Gastroenteritis 07/10/07   GERD 07/24/2006   GRAVE'S DISEASE 01/01/2008   History of hidradenitis suppurativa    History of kidney stones    History of thrush    HIV DISEASE 07/24/2006   dx March 05   Hyperlipidemia    HYPERTENSION 07/24/2006   Hyperthyroidism 08/2006   Grave's Disease -diffuse radiotracer uptake 08/25/06 Thyroid scan-Cold nodule to R lower lobe of thyrorid   Menometrorrhagia    hx of   Nephrolithiasis    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 Completed 2 yrs Prednisone after lung bx confirmation. No symptoms since then.   Suppurative hidradenitis    Thyroid cancer ([place])    THYROID NODULE, RIGHT 02/08/2007  -2018 thyroid nodules were precancerous but pathology was negative for thyroid cancer.   PAST SURGICAL HISTORY: Past Surgical History:  Procedure Laterality Date   BREAST LUMPECTOMY WITH RADIOACTIVE SEED AND SENTINEL LYMPH NODE BIOPSY Right 10/03/2018   Procedure: RIGHT BREAST LUMPECTOMY WITH RADIOACTIVE SEED AND SENTINEL LYMPH NODE MAPPING;  Surgeon: Erroll Luna, MD;  Location: [facility];  Service: General;  Laterality: Right;   BREAST SURGERY  1997   Breast Reduction    CYSTOSCOPY W/ URETERAL STENT REMOVAL  11/09/2012   Procedure: CYSTOSCOPY WITH STENT REMOVAL;  Surgeon: Alexis Frock, MD;  Location: [facility];  Service: Urology;  Laterality: Right;   CYSTOSCOPY WITH RETROGRADE  PYELOGRAM, URETEROSCOPY AND STENT PLACEMENT  11/09/2012   Procedure: CYSTOSCOPY WITH RETROGRADE PYELOGRAM, URETEROSCOPY AND STENT PLACEMENT;  Surgeon: Alexis Frock, MD;  Location: [facility];  Service: Urology;  Laterality: Left;  LEFT URETEROSCOPY, STONE MANIPULATION, left STENT exchange    CYSTOSCOPY WITH STENT PLACEMENT  10/02/2012   Procedure: CYSTOSCOPY WITH STENT PLACEMENT;  Surgeon: Alexis Frock, MD;  Location[facility];  Service: Urology;  Laterality: Left;   DILATION AND CURETTAGE OF UTERUS  Feb 2004   s/p for 1st trimester nonviable pregnancy   EYE SURGERY     sty under eyelid   INCISE AND DRAIN ABCESS  Nov 03   s/p I &D for righ inframmary fold hidradenitis   INCISION AND DRAINAGE PERITONSILLAR ABSCESS  Mar 03   IR CV LINE INJECTION  06/07/2018   IR IMAGING GUIDED PORT INSERTION  06/20/2018   IR REMOVAL TUN ACCESS W/ PORT W/O FL MOD SED  06/20/2018   IRRIGATION AND DEBRIDEMENT ABSCESS  01/31/2012   Procedure: IRRIGATION AND DEBRIDEMENT ABSCESS;  Surgeon: Shann Medal, MD;  [facility];  Service: General;  Laterality: Right;  right breast and axilla    NEPHROLITHOTOMY  10/02/2012   Procedure: NEPHROLITHOTOMY PERCUTANEOUS;  Surgeon: Alexis Frock, MD;  [facility];  Service: Urology;  Laterality: Right;  First Stage Percutaneous Nephrolithotomy with Surgeon Access, Left Ureteral Stent     NEPHROLITHOTOMY  10/04/2012   Procedure: NEPHROLITHOTOMY PERCUTANEOUS SECOND LOOK;  Surgeon: Alexis Frock, MD;  [facility];  Service: Urology;  Laterality: Right;      NEPHROLITHOTOMY  10/08/2012   Procedure: NEPHROLITHOTOMY PERCUTANEOUS;  Surgeon: Alexis Frock, MD;  [facility];  Service: Urology;  Laterality: Right;  THIRD STAGE, nephrostomy tube exchange x 2   NEPHROLITHOTOMY  10/11/2012   Procedure: NEPHROLITHOTOMY PERCUTANEOUS SECOND LOOK;  Surgeon: Alexis Frock, MD;  [facility];  Service: Urology;  Laterality: Right;  RIGHT 4 STAGE PERCUTANOUS  NEPHROLITHOTOMY, right URETEROSCOPY WITH HOLMIUM LASER    PORTACATH PLACEMENT Left 05/17/2018   Procedure: INSERTION PORT-A-CATH;  Surgeon: Coralie Keens, MD;  Location: [facility];  Service: General;  Laterality: Left;   RADICAL NECK DISSECTION  04/12/2017   limited/notes 04/12/2017   RADICAL NECK DISSECTION N/A 04/12/2017   Procedure: RADICAL NECK DISSECTION;  Surgeon: Melida Quitter, MD;  Location: [facility];  Service: ENT;  Laterality: N/A;  limited neck dissection 2 hours total   REDUCTION MAMMAPLASTY Bilateral [place]  04/12/2017   completion/notes 04/12/2017   THYROIDECTOMY N/A 04/12/2017   Procedure: THYROIDECTOMY;  Surgeon: Melida Quitter, MD;  Location: [facility];  Service: ENT;  Laterality: N/A;  Completion Thyroidectomy   TOTAL THYROIDECTOMY  2010  Thyroid nodules removed- pathology at the ENT doctor 2018 were benign -Over active thyroid and thyroidectomy with benign pathology  FAMILY HISTORY Family History  Problem Relation Age of Onset   Hypertension Mother    Cancer Mother        laryngeal   Heart disease Mother        stent   Hypertension Father    Lung cancer Father 90       hx smoking   Heart disease Other    Hypertension Other    Stroke Other        Grandparent   Kidney disease Other        Grandparent   Diabetes Other        FH of Diabetes   Hypertension Sister    Cancer Maternal Uncle        Lung CA   Hypertension Brother    Hypertension Sister    Breast cancer Maternal Aunt 65   Breast cancer Paternal Aunt 23   Prostate cancer Paternal Uncle    Breast cancer Maternal Aunt        dx 60+   Breast cancer Paternal Aunt        dx 15's   Breast cancer Paternal Aunt        dx 50's   Prostate cancer Paternal Uncle    Lung cancer Paternal Uncle    Breast cancer Cousin 10   Breast cancer Cousin        dx <50   Breast cancer Cousin        dx <50   Breast cancer Cousin        dx <50    Colon cancer Neg Hx    Esophageal cancer Neg Hx    Rectal cancer Neg Hx    Stomach cancer Neg Hx   The patient's mother is alive at age 76. The patient's father died at 43 from lung cancer (heavy smoker).  She does not know much about her father. The patient has 1 brother and 2 sisters. The patient's mother had a history of laryngeal cancer. There was a maternal cousin with breast cancer diagnosed at age 42. There were 2 maternal aunts with breast cancer, the youngest being diagnosed in the 58's. There was a maternal great aunt with breast cancer. There were 2 paternal aunts with breast cancer.    GYNECOLOGIC HISTORY:  Patient's last menstrual period was 03/31/2014. Menarche: 53 years old Age at first live birth: 53 years old She is GX P1.  Her LMP was in 2017. She did not use HRT.    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 At home is the patient's daughter, Verdie Drown, age 66, who is a Engineer, technical sales education and working part-time at the [employer]. The patient attends [employer].   ADVANCED DIRECTIVES: Not in place; at the 05/10/2018 visit the patient was given the appropriate documents to complete and notarized at her discretion   HEALTH MAINTENANCE: Social History   Tobacco Use   Smoking status: Former Smoker    Packs/day: 0.50    Years: 15.00    Pack years: 7.50    Types: Cigarettes    Start date: 04/12/2017   Smokeless tobacco: Never Used   Tobacco comment: quit smoking  may 2018  Substance Use Topics   Alcohol use: Yes    Alcohol/week: 0.0 standard drinks    Comment: social   Drug use: No     Colonoscopy: no  PAP: [facility]. 2018  Bone density: remote   Allergies  Allergen Reactions   Genvoya [Elviteg-Cobic-Emtricit-Tenofaf] Hives   Lisinopril Cough   Valsartan Other (See Comments) and Cough    Pt states she tolerates medicine now    Current Outpatient Medications  Medication Sig  Dispense Refill   amLODipine (NORVASC) 10 MG tablet Take 1 tablet (10 mg total) by mouth daily. For blood pressure. 90 tablet 3   atorvastatin (LIPITOR) 20 MG tablet Take 1 tablet by mouth every evening for high cholesterol. NEED APPOINTMENT FOR ANY MORE REFILLS (Patient taking differently: Take 20 mg by mouth every evening. ) 30 tablet 0   cephALEXin (KEFLEX) 500 MG capsule Take 1 capsule (500 mg total) by mouth 3 (three) times daily. 21 capsule 0   dolutegravir ([name]) 50 MG tablet Take 1 tablet (50 mg total) by mouth daily. 30 tablet 11   doxycycline (VIBRA-TABS) 100 MG tablet 100 mg daily.      emtricitabine-tenofovir AF (DESCOVY) 200-25 MG tablet Take 1 tablet by mouth daily. 30 tablet 11   fluconazole (DIFLUCAN) 100 MG tablet 2 tablets for first dose then 1 tab a day x 2 days 4 tablet 0   irbesartan-hydrochlorothiazide (AVALIDE) 150-12.5 MG tablet TAKE 1 TABLET BY MOUTH ONCE DAILY FOR BLOOD PRESSURE. 90 tablet 1   levothyroxine (SYNTHROID) 175 MCG tablet TAKE 1 TABLET BY MOUTH EVERY MORNING ON AN EMPTY STOMACH WITH A FULL GLASS OF WATER. 90 tablet 0   metFORMIN (GLUCOPHAGE) 500 MG tablet Take 1 tablet (500 mg total) by mouth 2 (two) times daily with a meal. 180 tablet 3   metoprolol succinate (TOPROL-XL) 50 MG 24 hr tablet TAKE 1 TABLET (50 MG TOTAL) BY MOUTH DAILY. TAKE WITH OR IMMEDIATELY FOLLOWING A MEAL. 90 tablet 0   mometasone (NASONEX) 50 MCG/ACT nasal spray PLACE 2 SPRAYS INTO THE NOSE DAILY. 17 g 3   Multiple Vitamin (MULTIVITAMIN) tablet Take 1  tablet by mouth daily.     ONE TOUCH LANCETS MISC Use as instructed to test blood sugar daily 200 each 5   ONETOUCH VERIO test strip USE AS INSTRUCTED TO TEST BLOOD SUGAR DAILY NEED APPOINTMENT FOR ANY MORE REFILLS 50 each 0   potassium chloride (K-DUR) 10 MEQ tablet TAKE 2 TABLETS (20 MEQ TOTAL) BY MOUTH 2 (TWO) TIMES DAILY. 60 tablet 1   PROAIR RESPICLICK 761 (90 Base) MCG/ACT AEPB INHALE 2 PUFFS INTO THE LUNGS EVERY 6  HOURS AS NEEDED FOR WHEEZING OR SHORTNESS OF BREATH. 1 each 2   spironolactone (ALDACTONE) 25 MG tablet Take 1 tablet (25 mg total) by mouth 2 (two) times daily. 60 tablet 3   tamoxifen (NOLVADEX) 20 MG tablet Take 1 tablet (20 mg total) by mouth daily. 30 tablet 5   traMADol (ULTRAM) 50 MG tablet Take 1 tablet (50 mg total) by mouth every 6 (six) hours as needed. 30 tablet 0   No current facility-administered medications for this visit.    Facility-Administered Medications Ordered in Other Visits  Medication Dose Route Frequency Provider Last Rate Last Dose   sodium chloride flush (NS) 0.9 % injection 10 mL  10 mL Intracatheter PRN Magrinat, Virgie Dad, MD   10 mL at 09/19/18 1551    OBJECTIVE:  Vitals:   05/24/19 0925  BP: 129/85  Pulse: 84  Resp: 20  Temp: 98.7 F (37.1 C)  SpO2: 99%     Body mass index is 39.06 kg/m.   Wt Readings from Last 3 Encounters:  05/24/19 234 lb 11.2 oz (106.5 kg)  05/18/19 232 lb (105.2 kg)  05/03/19 235 lb (106.6 kg)  ECOG FS:1 - Symptomatic but completely ambulatory GENERAL: Patient is a well appearing female in no acute distress, tearful today HEENT:  Sclerae anicteric.  Oropharynx clear and moist. No ulcerations or evidence of oropharyngeal candidiasis. Neck is supple.  NODES:  No cervical, supraclavicular, or axillary lymphadenopathy palpated.  BREAST EXAM: breasts inspected, no abnormality noted LUNGS:  Clear to auscultation bilaterally.  No wheezes or rhonchi. HEART:  Regular rate and rhythm. No murmur appreciated. ABDOMEN:  Soft, nontender.  Positive, normoactive bowel sounds. No organomegaly palpated. MSK:  No focal spinal tenderness to palpation. Full range of motion bilaterally in the upper extremities. EXTREMITIES:  No peripheral edema.   SKIN:  Clear with no obvious rashes or skin changes. No nail dyscrasia. NEURO:  Nonfocal. Well oriented.  Appropriate affect.    LAB RESULTS:  CMP     Component Value Date/Time   NA 141  05/24/2019 0918   K 3.3 (L) 05/24/2019 0918   CL 101 05/24/2019 0918   CO2 28 05/24/2019 0918   GLUCOSE 171 (H) 05/24/2019 0918   BUN 28 (H) 05/24/2019 0918   CREATININE 1.96 (H) 05/24/2019 0918   CREATININE 1.28 (H) 05/10/2018 1458   CREATININE 1.02 09/23/2015 1047   CALCIUM 9.4 05/24/2019 0918   PROT 8.1 05/24/2019 0918   ALBUMIN 2.9 (L) 05/24/2019 0918   AST 46 (H) 05/24/2019 0918   AST 28 05/10/2018 1458   ALT 30 05/24/2019 0918   ALT 23 05/10/2018 1458   ALKPHOS 89 05/24/2019 0918   BILITOT <0.2 (L) 05/24/2019 0918   BILITOT <0.2 (L) 05/10/2018 1458   GFRNONAA 29 (L) 05/24/2019 0918   GFRNONAA 48 (L) 05/10/2018 1458   GFRAA 33 (L) 05/24/2019 0918   GFRAA 56 (L) 05/10/2018 1458    No results found for: TOTALPROTELP, ALBUMINELP, A1GS, A2GS, BETS, BETA2SER, GAMS,  MSPIKE, SPEI  No results found for: Nils Pyle, [facility]  Lab Results  Component Value Date   WBC 7.0 05/24/2019   NEUTROABS 4.9 05/24/2019   HGB 12.6 05/24/2019   HCT 38.0 05/24/2019   MCV 91.3 05/24/2019   PLT 176 05/24/2019    '@LASTCHEMISTRY'$ @  No results found for: LABCA2  No components found for: [id number]  No results for input(s): INR in the last 168 hours.  No results found for: LABCA2  No results found for: WNI627  No results found for: OJJ009  No results found for: FGH829  No results found for: CA2729  No components found for: HGQUANT  No results found for: CEA1 / No results found for: CEA1   No results found for: AFPTUMOR  No results found for: CHROMOGRNA  No results found for: PSA1  Appointment on 05/24/2019  Component Date Value Ref Range Status   WBC 05/24/2019 7.0  4.0 - 10.5 K/uL Final   RBC 05/24/2019 4.16  3.87 - 5.11 MIL/uL Final   Hemoglobin 05/24/2019 12.6  12.0 - 15.0 g/dL Final   HCT 05/24/2019 38.0  36.0 - 46.0 % Final   MCV 05/24/2019 91.3  80.0 - 100.0 fL Final   MCH 05/24/2019 30.3  26.0 - 34.0 pg Final   MCHC 05/24/2019 33.2  30.0 -  36.0 g/dL Final   RDW 05/24/2019 17.8* 11.5 - 15.5 % Final   Platelets 05/24/2019 176  150 - 400 K/uL Final   nRBC 05/24/2019 0.0  0.0 - 0.2 % Final   Neutrophils Relative % 05/24/2019 70  % Final   Neutro Abs 05/24/2019 4.9  1.7 - 7.7 K/uL Final   Lymphocytes Relative 05/24/2019 21  % Final   Lymphs Abs 05/24/2019 1.5  0.7 - 4.0 K/uL Final   Monocytes Relative 05/24/2019 7  % Final   Monocytes Absolute 05/24/2019 0.5  0.1 - 1.0 K/uL Final   Eosinophils Relative 05/24/2019 1  % Final   Eosinophils Absolute 05/24/2019 0.1  0.0 - 0.5 K/uL Final   Basophils Relative 05/24/2019 0  % Final   Basophils Absolute 05/24/2019 0.0  0.0 - 0.1 K/uL Final   Immature Granulocytes 05/24/2019 1  % Final   Abs Immature Granulocytes 05/24/2019 0.08* 0.00 - 0.07 K/uL Final   Performed at [facility], [place] [address] [place], [place] [address]   Sodium 05/24/2019 141  135 - 145 mmol/L Final   Potassium 05/24/2019 3.3* 3.5 - 5.1 mmol/L Final   Chloride 05/24/2019 101  98 - 111 mmol/L Final   CO2 05/24/2019 28  22 - 32 mmol/L Final   Glucose, Bld 05/24/2019 171* 70 - 99 mg/dL Final   BUN 05/24/2019 28* 6 - 20 mg/dL Final   Creatinine, Ser 05/24/2019 1.96* 0.44 - 1.00 mg/dL Final   Calcium 05/24/2019 9.4  8.9 - 10.3 mg/dL Final   Total Protein 05/24/2019 8.1  6.5 - 8.1 g/dL Final   Albumin 05/24/2019 2.9* 3.5 - 5.0 g/dL Final   AST 05/24/2019 46* 15 - 41 U/L Final   ALT 05/24/2019 30  0 - 44 U/L Final   Alkaline Phosphatase 05/24/2019 89  38 - 126 U/L Final   Total Bilirubin 05/24/2019 <0.2* 0.3 - 1.2 mg/dL Final   GFR calc non Af Amer 05/24/2019 29* >60 mL/min Final   GFR calc Af Amer 05/24/2019 33* >60 mL/min Final   Anion gap 05/24/2019 12  5 - 15 Final   Performed at [facility],  [address] [place], [place] [address]    (this displays the last labs from the last 3 days)  No results found for: TOTALPROTELP,  ALBUMINELP, A1GS, A2GS, BETS, BETA2SER, GAMS, MSPIKE, SPEI (this displays SPEP labs)  No results found for: KPAFRELGTCHN, LAMBDASER, KAPLAMBRATIO (kappa/lambda light chains)  No results found for: HGBA, HGBA2QUANT, HGBFQUANT, HGBSQUAN (Hemoglobinopathy evaluation)   No results found for: LDH  Lab Results  Component Value Date   IRON 26 (L) 02/08/2007   TIBC 371 02/08/2007   IRONPCTSAT 7 (L) 02/08/2007   (Iron and TIBC)  No results found for: FERRITIN  Urinalysis    Component Value Date/Time   COLORURINE YELLOW 05/18/2019 2348   APPEARANCEUR HAZY (A) 05/18/2019 2348   LABSPEC 1.013 05/18/2019 2348   PHURINE 6.0 05/18/2019 2348   GLUCOSEU NEGATIVE 05/18/2019 2348   GLUCOSEU NEG mg/dL 01/23/2008 1418   HGBUR MODERATE (A) 05/18/2019 2348   BILIRUBINUR NEGATIVE 05/18/2019 2348   BILIRUBINUR negative 12/29/2017 [place] 05/18/2019 2348   PROTEINUR >=300 (A) 05/18/2019 2348   UROBILINOGEN 0.2 12/29/2017 1046   UROBILINOGEN 0.2 10/21/2014 2347   NITRITE NEGATIVE 05/18/2019 2348   LEUKOCYTESUR LARGE (A) 05/18/2019 2348     STUDIES: No results found.   ELIGIBLE FOR AVAILABLE RESEARCH PROTOCOL: UPBEAT   ASSESSMENT: 53 y.o. [place], [place] woman status post right breast upper inner quadrant biopsy 04/26/2018 for a clinical T2 N0, stage IB invasive ductal carcinoma, grade 2, ER/PR positive, HER-2 positive with an MIB-1 of 70%.  (1) genetics testing 07/03/2018: no pathogenic mutations. Genes Analyzed (43 total): APC*, ATM*, AXIN2, BARD1, BMPR1A, BRCA1*, BRCA2*, BRIP1*, CDH1*, CDK4, CDKN2A, CHEK2*, DICER1,GALNT12, HOXB13, MEN1, MLH1*, MRE11A, MSH2*, MSH3, MSH6*, MUTYH*, NBN, NF1*, NTHL1, PALB2*, PMS2*, POLD1, POLE,PTEN*, RAD50, RAD51C*, RAD51D*, RET, SDHB, SDHD, SMAD4, SMARCA4, STK11 and TP53* (sequencing and deletion/duplication); EGFR (sequencing only); EPCAM and GREM1 (deletion/duplication only). DNA and RNA analyses performed for * genes.  (2) neoadjuvant  chemotherapy consisting of carboplatin, docetaxel, trastuzumab and pertuzumab given every 21 days x 6, starting 05/31/2018, completed 09/14/2018  (a) Pertuzumab discontinued starting with cycle 2 because of diarrhea problems  (b) Taxotere stopped after 4 cycles due peripheral neuropathy and substituted for last two cycles with gemcitabine  (3)  definitive surgery on 10/03/2018 showed residual ypT1c ypN0 invasive ductal carcinoma, grade 2, with negative margins; a total of 5 sentinel lymph nodes removed; repeat prognostic panel again triple positive  (4) T-DM1 given adjuvantly due to residual disease: started 11/13/2018.  (a) echo on 05/16/2018 shows an EF of 60-65%.  (b) echo on 08/27/18 shows an EF 55-60%  (c) echo on 12/03/2018 shows an EF of 60-65%  (d) echocardiogram on 03/06/2019 showed an ejection fraction of 55% as read by Dr. Haroldine Laws.  (5) adjuvant radiation completed 01/07/2019 - 02/21/2019 (delayed due to wound healing issues)  Site/dose:   The patient initially received a dose of 50.4 Gy in 28 fractions to the right breast using whole-breast tangent fields. This was delivered using a 3-D conformal technique. The patient then received a boost to the seroma. This delivered an additional 10 Gy in 5 fractions using 12e electrons with a special teletherapy technique. The total dose was 60.4 Gy.  (6) Tamoxifen started 02/28/2019  (7) hidradenitis/boils: on doxycycline daily   PLAN: Deeana is not doing well today.  It sounds like she is in a hostile work environment, and factoring that in with her breast cancer diagnosis, chemotherapy, and surgeries, she has increased anxiety which is impacting her  ability to function.  She clearly cannot work in this environment considering what she has been through with her cancer, and how much her anxiety has increased due to her changed job duties.  She was recommended to go on disability, so that she can focus on successfully completed her cancer  treatments, and healing physically and emotionally from her therapies.  [name] met with Edwyna Shell today, our social worker who is assisting with this process.  It is unclear if the increase of shortness of breath is anxiety related or not.  Her last echo showed an EF of 50%.  I have ordered a chest xray, and will hold her TDM1 today until she has repeat echo.  I sent Dr. Haroldine Laws and his nurse Kevan Rosebush a message to get that expedited.  Hopefully she will be cleared to resume.    For her lower back pain, I ordered some repeat plain films to evaluate.   Kameelah is on several different medications.  At this point I am not inclined to start anything for anxiety, however she knows to call me if she feels like she needs something, such as effexor.  Leyani was recommended to continue with the appropriate pandemic precautions. She knows to call for any questions or concerns that may arise between now and her next appointment.  A total of (30) minutes of face-to-face time was spent with this patient with greater than 50% of that time in counseling and care-coordination.    Wilber Bihari, NP  05/24/19 10:19 AM Medical Oncology and Hematology [place] [facility] [address] [place], [place] [address] Tel. [phone]    Fax. [phone]

## 2019-05-24 NOTE — Telephone Encounter (Signed)
Left pt message to move up appt w/ echo from 8/31 to 8//. Echo @ 8:15 /DB 9:30, asked pt to call back to confirm

## 2019-05-24 NOTE — Progress Notes (Signed)
[facility] CSW Progress Notes  Met w patient in infusion to offer support/resources.  States she is doing "much better", less anxiety, good sleep yesterday.  Working on staying in present, dealing w fears of future through her faith resources.  Has support from family members.  Provided additional resources re working w cancer.  Discussed sources of outside financial support - patient has this information in her email.  Will contact CSW if she would like to proceed w any of these applications.  Edwyna Shell, LCSW Clinical Social Worker Phone:  [phone]

## 2019-05-27 ENCOUNTER — Other Ambulatory Visit: Payer: Self-pay | Admitting: Oncology

## 2019-05-27 ENCOUNTER — Other Ambulatory Visit: Payer: Self-pay

## 2019-05-27 DIAGNOSIS — R1084 Generalized abdominal pain: Secondary | ICD-10-CM

## 2019-05-27 DIAGNOSIS — E876 Hypokalemia: Secondary | ICD-10-CM

## 2019-05-28 ENCOUNTER — Inpatient Hospital Stay (HOSPITAL_COMMUNITY): Admission: RE | Admit: 2019-05-28 | Payer: 59 | Source: Ambulatory Visit | Admitting: Internal Medicine

## 2019-05-28 ENCOUNTER — Telehealth: Payer: Self-pay

## 2019-05-28 ENCOUNTER — Ambulatory Visit (HOSPITAL_COMMUNITY): Admission: RE | Admit: 2019-05-28 | Payer: 59 | Source: Ambulatory Visit

## 2019-05-28 NOTE — Telephone Encounter (Signed)
Spoke with pt by phone regarding CT scan appt and instructions Scheduled for 1:30 06/03/19.  Arrive to [facility] radiology dept at 1:15. Nothing to eat or drink for 4 hrs before procedure except for oral contrast.   Pt instructed to pick up contrast in cancer center lobby before Friday afternoon. Pt verbalizes understanding of instructions.

## 2019-06-02 ENCOUNTER — Other Ambulatory Visit: Payer: Self-pay | Admitting: Internal Medicine

## 2019-06-02 DIAGNOSIS — B20 Human immunodeficiency virus [HIV] disease: Secondary | ICD-10-CM

## 2019-06-03 ENCOUNTER — Ambulatory Visit (HOSPITAL_COMMUNITY)
Admission: RE | Admit: 2019-06-03 | Discharge: 2019-06-03 | Disposition: A | Discharge status: Home / Self Care | Payer: 59 | Source: Ambulatory Visit | Attending: Oncology | Admitting: Oncology

## 2019-06-03 ENCOUNTER — Other Ambulatory Visit: Payer: Self-pay

## 2019-06-03 DIAGNOSIS — R1084 Generalized abdominal pain: Secondary | ICD-10-CM | POA: Diagnosis not present

## 2019-06-05 ENCOUNTER — Other Ambulatory Visit: Payer: Self-pay

## 2019-06-05 ENCOUNTER — Ambulatory Visit (INDEPENDENT_AMBULATORY_CARE_PROVIDER_SITE_OTHER): Payer: 59 | Admitting: Primary Care

## 2019-06-05 VITALS — BP 148/92 | HR 71 | Temp 98.2°F | Ht 65.0 in | Wt 235.0 lb

## 2019-06-05 DIAGNOSIS — E890 Postprocedural hypothyroidism: Secondary | ICD-10-CM

## 2019-06-05 DIAGNOSIS — E785 Hyperlipidemia, unspecified: Secondary | ICD-10-CM

## 2019-06-05 DIAGNOSIS — J4530 Mild persistent asthma, uncomplicated: Secondary | ICD-10-CM | POA: Diagnosis not present

## 2019-06-05 DIAGNOSIS — I10 Essential (primary) hypertension: Secondary | ICD-10-CM

## 2019-06-05 DIAGNOSIS — Z170 Estrogen receptor positive status [ER+]: Secondary | ICD-10-CM

## 2019-06-05 DIAGNOSIS — C50311 Malignant neoplasm of lower-inner quadrant of right female breast: Secondary | ICD-10-CM

## 2019-06-05 DIAGNOSIS — F419 Anxiety disorder, unspecified: Secondary | ICD-10-CM

## 2019-06-05 DIAGNOSIS — L0293 Carbuncle, unspecified: Secondary | ICD-10-CM

## 2019-06-05 DIAGNOSIS — B20 Human immunodeficiency virus [HIV] disease: Secondary | ICD-10-CM

## 2019-06-05 DIAGNOSIS — E119 Type 2 diabetes mellitus without complications: Secondary | ICD-10-CM

## 2019-06-05 DIAGNOSIS — F330 Major depressive disorder, recurrent, mild: Secondary | ICD-10-CM

## 2019-06-05 LAB — BASIC METABOLIC PANEL
BUN: 21 mg/dL (ref 6–23)
CO2: 28 mEq/L (ref 19–32)
Calcium: 8.9 mg/dL (ref 8.4–10.5)
Chloride: 103 mEq/L (ref 96–112)
Creatinine, Ser: 1.47 mg/dL — ABNORMAL HIGH (ref 0.40–1.20)
GFR: 45.33 mL/min — ABNORMAL LOW (ref >60.00)
Glucose, Bld: 103 mg/dL — ABNORMAL HIGH (ref 70–99)
Potassium: 3.8 mEq/L (ref 3.5–5.1)
Sodium: 140 mEq/L (ref 135–145)

## 2019-06-05 LAB — POCT GLYCOSYLATED HEMOGLOBIN (HGB A1C): Hemoglobin A1C: 7.1 % — AB (ref 4.0–5.6)

## 2019-06-05 LAB — LIPID PANEL
Cholesterol: 263 mg/dL — ABNORMAL HIGH (ref 0–200)
HDL: 39.6 mg/dL (ref >39.00)
NonHDL: 223.87
Total CHOL/HDL Ratio: 7
Triglycerides: 255 mg/dL — ABNORMAL HIGH (ref 0.0–149.0)
VLDL: 51 mg/dL — ABNORMAL HIGH (ref 0.0–40.0)

## 2019-06-05 LAB — TSH: TSH: 34.55 u[IU]/mL — ABNORMAL HIGH (ref 0.35–4.50)

## 2019-06-05 LAB — LDL CHOLESTEROL, DIRECT: Direct LDL: 171 mg/dL

## 2019-06-05 MED ORDER — FLOVENT HFA 110 MCG/ACT IN AERO: 1.0000 | INHALATION_SPRAY | Freq: Two times a day (BID) | RESPIRATORY_TRACT | 2 refills | Status: DC

## 2019-06-05 NOTE — Progress Notes (Signed)
Subjective:    Patient ID: [name], female    DOB: 1966-06-02, 53 y.o.   MRN: [id number]  HPI  [name] is a 53 year old female who presents today for follow up. She does not have her medications with her today and is unsure of exactly what she is taking.   Following with oncology, has two chemotherapy treatments remaining, completed radiation in March/April 2020. Compliant to tamoxiifen.  Also following with infectious disease, Dr. Megan Tate, undetectable HIV levels per patient, also noted from December 2019 in [place]. She is due for follow up soon.  She is compliant to her levothyroxine every morning with water and orange juice, no food or other medications. She is taking her vitamins in the morning. Recent TSH of 34.  She is checking her BP at home infrequently which is running 130-140/90's. She's not taken her medications today. She believes that she is taking her Amlodipine 10 mg and metoprolol succinate, not sure if she's taking spironolactone and irbesartan-HCTZ.   She is compliant to oral potassium chloride 10 mEq daily.   Following with pulmonology annually for sarcoidosis and asthma. Uses her Proair 3-4 times weekly (once daily) on average recently. She will use her inhaler for shortness of breath, chest tightness, and wheezing. Feels like she could use her albuterol more frequently but tries to limit.  She is compliant to metformin 500 mg BID for which she has taken for years. She was told by a family member that she needs to stop.  BP Readings from Last 3 Encounters:  06/05/19 (!) 148/92  05/24/19 129/85  05/19/19 (!) 154/84     Review of Systems  Constitutional: Negative for fever.  Respiratory: Positive for chest tightness, shortness of breath and wheezing.   Cardiovascular: Negative for chest pain.  Neurological: Negative for dizziness and headaches.       Past Medical History:  Diagnosis Date  . Allergy   . Anemia    Normocytic  . Anxiety   .  Asthma   . Blood dyscrasia   . Bronchitis 2005  . 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 Diabetes mellitus without complication ([place])   . Family history of breast cancer   . Family history of lung cancer   . Family history of prostate cancer   . Gastroenteritis 07/10/07  . GERD 07/24/2006  . GRAVE'S DISEASE 01/01/2008  . History of hidradenitis suppurativa   . History of kidney stones   . History of thrush   . HIV DISEASE 07/24/2006   dx March 05  . Hyperlipidemia   . HYPERTENSION 07/24/2006  . Hyperthyroidism 08/2006   Grave's Disease -diffuse radiotracer uptake 08/25/06 Thyroid scan-Cold nodule to R lower lobe of thyrorid  . Menometrorrhagia    hx of  . Nephrolithiasis   . 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 Personal history of chemotherapy    2020  . Personal history of radiation therapy    2020  . Pneumonia 2005  . 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 Sarcoidosis 02/08/2007   dx as a teenager in [place] from abnl CXR. Completed 2 yrs Prednisone after lung bx confirmation. No symptoms since then.  . Suppurative hidradenitis   . Thyroid cancer ([place])   . THYROID NODULE, RIGHT 02/08/2007     Social History   Socioeconomic History  . Marital status: Single    Spouse name: Not on file  . Number of children: 1  . Years of education:  Not on file  . Highest education level: Not on file  Occupational History  . Occupation: Secondary school teacher  Social Needs  . Financial resource strain: Not on file  . Food insecurity    Worry: Not on file    Inability: Not on file  . Transportation needs    Medical: No    Non-medical: No  Tobacco Use  . Smoking status: Former Smoker    Packs/day: 0.50    Years: 15.00    Pack years: 7.50    Types: Cigarettes    Start date: 04/12/2017  . Smokeless tobacco: Never Used  . Tobacco comment: quit smoking  may 2018  Substance and Sexual Activity  . Alcohol use: Yes     Alcohol/week: 0.0 standard drinks    Comment: social  . Drug use: No  . Sexual activity: Not Currently    Birth control/protection: Post-menopausal    Comment: declined condoms  Lifestyle  . Physical activity    Days per week: Not on file    Minutes per session: Not on file  . Stress: Not on file  Relationships  . Social Herbalist on phone: Not on file    Gets together: Not on file    Attends religious service: Not on file    Active member of club or organization: Not on file    Attends meetings of clubs or organizations: Not on file    Relationship status: Not on file  . Intimate partner violence    Fear of current or ex partner: No    Emotionally abused: No    Physically abused: No    Forced sexual activity: No  Other Topics Concern  . Not on file  Social History Narrative  . Not on file    Past Surgical History:  Procedure Laterality Date  . BREAST EXCISIONAL BIOPSY Right 04/26/2018   right axilla negative  . BREAST EXCISIONAL BIOPSY Left 04/26/2018   left axilla negative  . BREAST LUMPECTOMY Right 10/03/2018   malignant  . BREAST LUMPECTOMY WITH RADIOACTIVE SEED AND SENTINEL LYMPH NODE BIOPSY Right 10/03/2018   Procedure: RIGHT BREAST LUMPECTOMY WITH RADIOACTIVE SEED AND SENTINEL LYMPH NODE MAPPING;  Surgeon: Erroll Luna, MD;  Location: [facility];  Service: General;  Laterality: Right;  . BREAST SURGERY  1997   Breast Reduction   . CYSTOSCOPY W/ URETERAL STENT REMOVAL  11/09/2012   Procedure: CYSTOSCOPY WITH STENT REMOVAL;  Surgeon: Alexis Frock, MD;  Location: [facility];  Service: Urology;  Laterality: Right;  . CYSTOSCOPY WITH RETROGRADE PYELOGRAM, URETEROSCOPY AND STENT PLACEMENT  11/09/2012   Procedure: CYSTOSCOPY WITH RETROGRADE PYELOGRAM, URETEROSCOPY AND STENT PLACEMENT;  Surgeon: Alexis Frock, MD;  Location: [facility];  Service: Urology;  Laterality: Left;  LEFT URETEROSCOPY, STONE MANIPULATION, left STENT exchange   . CYSTOSCOPY WITH STENT  PLACEMENT  10/02/2012   Procedure: CYSTOSCOPY WITH STENT PLACEMENT;  Surgeon: Alexis Frock, MD;  Location: [facility];  Service: Urology;  Laterality: Left;  . DILATION AND CURETTAGE OF UTERUS  Feb 2004   s/p for 1st trimester nonviable pregnancy  . EYE SURGERY     sty under eyelid  . INCISE AND DRAIN ABCESS  Nov 03   s/p I &D for righ inframmary fold hidradenitis  . INCISION AND DRAINAGE PERITONSILLAR ABSCESS  Mar 03  . IR CV LINE INJECTION  06/07/2018  . IR IMAGING GUIDED PORT INSERTION  06/20/2018  . IR REMOVAL TUN ACCESS W/ PORT W/O FL MOD SED  06/20/2018  .  IRRIGATION AND DEBRIDEMENT ABSCESS  01/31/2012   Procedure: IRRIGATION AND DEBRIDEMENT ABSCESS;  Surgeon: Shann Medal, MD;  Location: [facility];  Service: General;  Laterality: Right;  right breast and axilla   . NEPHROLITHOTOMY  10/02/2012   Procedure: NEPHROLITHOTOMY PERCUTANEOUS;  Surgeon: Alexis Frock, MD;  Location: [facility];  Service: Urology;  Laterality: Right;  First Stage Percutaneous Nephrolithotomy with Surgeon Access, Left Ureteral Stent    . NEPHROLITHOTOMY  10/04/2012   Procedure: NEPHROLITHOTOMY PERCUTANEOUS SECOND LOOK;  Surgeon: Alexis Frock, MD;  Location: [facility];  Service: Urology;  Laterality: Right;     . NEPHROLITHOTOMY  10/08/2012   Procedure: NEPHROLITHOTOMY PERCUTANEOUS;  Surgeon: Alexis Frock, MD;  Location: [facility];  Service: Urology;  Laterality: Right;  THIRD STAGE, nephrostomy tube exchange x 2  . NEPHROLITHOTOMY  10/11/2012   Procedure: NEPHROLITHOTOMY PERCUTANEOUS SECOND LOOK;  Surgeon: Alexis Frock, MD;  Location: [facility];  Service: Urology;  Laterality: Right;  RIGHT 4 STAGE PERCUTANOUS NEPHROLITHOTOMY, right URETEROSCOPY WITH HOLMIUM LASER   . PORTACATH PLACEMENT Left 05/17/2018   Procedure: INSERTION PORT-A-CATH;  Surgeon: Coralie Keens, MD;  Location: [facility];  Service: General;  Laterality: Left;  . RADICAL NECK DISSECTION  04/12/2017   limited/notes 04/12/2017  . RADICAL NECK  DISSECTION N/A 04/12/2017   Procedure: RADICAL NECK DISSECTION;  Surgeon: Melida Quitter, MD;  Location: [facility];  Service: ENT;  Laterality: N/A;  limited neck dissection 2 hours total  . REDUCTION MAMMAPLASTY Bilateral 1998  . [place]  1994  . THYROIDECTOMY  04/12/2017   completion/notes 04/12/2017  . THYROIDECTOMY N/A 04/12/2017   Procedure: THYROIDECTOMY;  Surgeon: Melida Quitter, MD;  Location: [facility];  Service: ENT;  Laterality: N/A;  Completion Thyroidectomy  . TOTAL THYROIDECTOMY  2010    Family History  Problem Relation Age of Onset  . Hypertension Mother   . Cancer Mother        laryngeal  . Heart disease Mother        stent  . Hypertension Father   . Lung cancer Father 46       hx smoking  . Heart disease Other   . Hypertension Other   . Stroke Other        Grandparent  . Kidney disease Other        Grandparent  . Diabetes Other        FH of Diabetes  . Hypertension Sister   . Cancer Maternal Uncle        Lung CA  . Hypertension Brother   . Hypertension Sister   . Breast cancer Maternal Aunt 65  . Breast cancer Paternal Aunt 38  . Prostate cancer Paternal Uncle   . Breast cancer Maternal Aunt        dx 60+  . Breast cancer Paternal Aunt        dx 64's  . Breast cancer Paternal Aunt        dx 70's  . Prostate cancer Paternal Uncle   . Lung cancer Paternal Uncle   . Breast cancer Cousin 30  . Breast cancer Cousin        dx <50  . Breast cancer Cousin        dx <50  . Breast cancer Cousin        dx <50  . Colon cancer Neg Hx   . Esophageal cancer Neg Hx   . Rectal cancer Neg Hx   . Stomach cancer Neg Hx  Allergies  Allergen Reactions  . Genvoya [Elviteg-Cobic-Emtricit-Tenofaf] Hives  . Lisinopril Cough  . Valsartan Other (See Comments) and Cough    Pt states she tolerates medicine now    Current Outpatient Medications on File Prior to Visit  Medication Sig Dispense Refill  . amLODipine (NORVASC) 10 MG tablet Take 1 tablet (10 mg total) by mouth  daily. For blood pressure. 90 tablet 3  . cephALEXin (KEFLEX) 500 MG capsule Take 1 capsule (500 mg total) by mouth 3 (three) times daily. 21 capsule 0  . DESCOVY 200-25 MG tablet TAKE 1 TABLET BY MOUTH EVERY DAY 30 tablet 1  . irbesartan-hydrochlorothiazide (AVALIDE) 150-12.5 MG tablet TAKE 1 TABLET BY MOUTH ONCE DAILY FOR BLOOD PRESSURE. 90 tablet 1  . levothyroxine (SYNTHROID) 175 MCG tablet TAKE 1 TABLET BY MOUTH EVERY MORNING ON AN EMPTY STOMACH WITH A FULL GLASS OF WATER. 90 tablet 0  . metoprolol succinate (TOPROL-XL) 50 MG 24 hr tablet TAKE 1 TABLET (50 MG TOTAL) BY MOUTH DAILY. TAKE WITH OR IMMEDIATELY FOLLOWING A MEAL. 90 tablet 0  . Multiple Vitamin (MULTIVITAMIN) tablet Take 1 tablet by mouth daily.    . ONE TOUCH LANCETS MISC Use as instructed to test blood sugar daily 200 each 5  . ONETOUCH VERIO test strip USE AS INSTRUCTED TO TEST BLOOD SUGAR DAILY NEED APPOINTMENT FOR ANY MORE REFILLS 50 each 0  . potassium chloride (K-DUR) 10 MEQ tablet TAKE 2 TABLETS (20 MEQ TOTAL) BY MOUTH 2 (TWO) TIMES DAILY. 60 tablet 1  . tamoxifen (NOLVADEX) 20 MG tablet Take 1 tablet (20 mg total) by mouth daily. 30 tablet 5  . [name] 50 MG tablet TAKE 1 TABLET BY MOUTH EVERY DAY 30 tablet 1  . traMADol (ULTRAM) 50 MG tablet Take 1 tablet (50 mg total) by mouth every 6 (six) hours as needed. 30 tablet 0  . atorvastatin (LIPITOR) 20 MG tablet Take 1 tablet by mouth every evening for high cholesterol. NEED APPOINTMENT FOR ANY MORE REFILLS (Patient not taking: Reported on 06/05/2019) 30 tablet 0  . doxycycline (VIBRA-TABS) 100 MG tablet 100 mg daily.     . fluconazole (DIFLUCAN) 100 MG tablet 2 tablets for first dose then 1 tab a day x 2 days (Patient not taking: Reported on 06/05/2019) 4 tablet 0  . PROAIR RESPICLICK 370 (90 Base) MCG/ACT AEPB INHALE 2 PUFFS INTO THE LUNGS EVERY 6 HOURS AS NEEDED FOR WHEEZING OR SHORTNESS OF BREATH. (Patient not taking: Reported on 06/05/2019) 1 each 2  . [DISCONTINUED]  prochlorperazine (COMPAZINE) 10 MG tablet TAKE 1 TABLET BY MOUTH EVERY 6 HOURS AS NEEDED FOR NAUSEA OR VOMITING 30 tablet 2   Current Facility-Administered Medications on File Prior to Visit  Medication Dose Route Frequency Provider Last Rate Last Dose  . sodium chloride flush (NS) 0.9 % injection 10 mL  10 mL Intracatheter PRN Magrinat, Virgie Dad, MD   10 mL at 09/19/18 1551    BP (!) 148/92   Pulse 71   Temp 98.2 F (36.8 C) (Temporal)   Ht 5\' 5"  (1.651 m)   Wt 235 lb (106.6 kg)   LMP 03/31/2014 Comment: Negative Serum HCG 06/07/17  SpO2 97%   BMI 39.11 kg/m    Objective:   Physical Exam  Constitutional: She is oriented to person, place, and time. She appears well-nourished.  Neck: Neck supple.  Cardiovascular: Normal rate and regular rhythm.  Respiratory: Effort normal and breath sounds normal.  Neurological: She is alert and oriented to person, place,  and time.  Skin: Skin is warm and dry.  Psychiatric: She has a normal mood and affect.           Assessment & Plan:

## 2019-06-05 NOTE — Patient Instructions (Addendum)
Be sure to take your levothyroxine (thyroid medication) every morning on an empty stomach with water only. No food or other medications for 30 minutes. No heartburn medication, iron pills, calcium, vitamin D, or magnesium pills within four hours of taking levothyroxine.   Start fluticasone (Flovent) inhaler for asthma. Inhale 1 puff into the lungs twice daily, everyday.  Use the albuterol (red inhaler) only as needed for breakthrough symptoms.  Stop metformin for diabetes for now. I'll be in touch once I receive your labs.  Please notify me if if you are/are not taking irbesartan-hydrochlorothiazide for blood pressure.  Stop by the lab prior to leaving today. I will notify you of your results once received.   Please schedule a follow up appointment in 6 months.   It was a pleasure to see you today!

## 2019-06-06 ENCOUNTER — Other Ambulatory Visit: Payer: Self-pay

## 2019-06-06 ENCOUNTER — Ambulatory Visit
Admission: RE | Admit: 2019-06-06 | Discharge: 2019-06-06 | Disposition: A | Discharge status: Home / Self Care | Payer: 59 | Source: Ambulatory Visit | Attending: Oncology | Admitting: Oncology

## 2019-06-06 DIAGNOSIS — C50311 Malignant neoplasm of lower-inner quadrant of right female breast: Secondary | ICD-10-CM

## 2019-06-06 DIAGNOSIS — B20 Human immunodeficiency virus [HIV] disease: Secondary | ICD-10-CM

## 2019-06-06 DIAGNOSIS — E119 Type 2 diabetes mellitus without complications: Secondary | ICD-10-CM

## 2019-06-06 DIAGNOSIS — Z170 Estrogen receptor positive status [ER+]: Secondary | ICD-10-CM

## 2019-06-06 DIAGNOSIS — L732 Hidradenitis suppurativa: Secondary | ICD-10-CM

## 2019-06-06 HISTORY — DX: Personal history of antineoplastic chemotherapy: Z92.21

## 2019-06-06 HISTORY — DX: Personal history of irradiation: Z92.3

## 2019-06-06 MED ORDER — GLIPIZIDE 5 MG PO TABS: 5.0000 mg | ORAL_TABLET | Freq: Two times a day (BID) | ORAL | 0 refills | Status: DC

## 2019-06-06 NOTE — Assessment & Plan Note (Signed)
Patient endorses doing well overall, has quit working which has reduced stress. Continue to monitor.

## 2019-06-06 NOTE — Assessment & Plan Note (Signed)
Seems to be using albuterol frequently and would use more frequently. Could be side effect of chemotherapy, but she does get temporary improvement with albuterol.  Given persistent symptoms with temporary improvement with albuterol we will progress with stepwise therapy with ICS.  Rx for Flovent inhaler sent to pharmacy. Discussed to rinse out mouth with each use. Continue albuterol inhaler only as needed. She will update.

## 2019-06-06 NOTE — Assessment & Plan Note (Addendum)
Following with oncology. Completed radiation, nearly completed chemotherapy.

## 2019-06-06 NOTE — Assessment & Plan Note (Signed)
Following with infectious disease, due for follow up soon. She will call for an appointment.

## 2019-06-06 NOTE — Assessment & Plan Note (Signed)
Above goal in the office during visit. It doesn't seem like she's taking spironolactone based off of prescribing history, patient also thinks she is not taking irbesartan-HCTZ.  I've requested that she call us back once she's home so we can go over her medications in detail and update her medication list.   Consider resuming either irbesartan or HCTZ if not taking as her BP is above goal in the office today, also with home readings. She will update.

## 2019-06-06 NOTE — Assessment & Plan Note (Signed)
Managed on Doxycycline per infectious disease.

## 2019-06-06 NOTE — Assessment & Plan Note (Signed)
Taking levothyroxine inappropriately, discussed proper instructions for administration. Also unclear if she's taking her levothyroxine daily.  Repeat TSH in 4 weeks after she's taking appropriately and daily.

## 2019-06-06 NOTE — Assessment & Plan Note (Signed)
LDL above goal, patient endorsed taking atorvastatin every other day, but then told CMA that she wasn't taking.  We will call to inquire. If she is taking every other day due to myalgias then we will need to increase her dose for goal LDL of <100.

## 2019-06-06 NOTE — Assessment & Plan Note (Signed)
Overall seems stable, patient denies concerns today.

## 2019-06-06 NOTE — Assessment & Plan Note (Addendum)
A1C of 7.1 on recent labs. Evident decline of renal function based off of serial labs. Given this we will stop Metformin and switch to Glipizide 5 mg BID.  Repeat A1C in 3 months.  Rx  sent to pharmacy.

## 2019-06-10 ENCOUNTER — Other Ambulatory Visit: Payer: Self-pay | Admitting: Primary Care

## 2019-06-10 DIAGNOSIS — E785 Hyperlipidemia, unspecified: Secondary | ICD-10-CM

## 2019-06-10 MED ORDER — ROSUVASTATIN CALCIUM 10 MG PO TABS: 10.0000 mg | ORAL_TABLET | Freq: Every day | ORAL | 0 refills | Status: DC

## 2019-06-11 ENCOUNTER — Encounter: Payer: Self-pay | Admitting: *Deleted

## 2019-06-13 ENCOUNTER — Other Ambulatory Visit: Payer: Self-pay | Admitting: Primary Care

## 2019-06-13 DIAGNOSIS — I10 Essential (primary) hypertension: Secondary | ICD-10-CM

## 2019-06-14 ENCOUNTER — Encounter: Payer: Self-pay | Admitting: Adult Health

## 2019-06-14 ENCOUNTER — Other Ambulatory Visit: Payer: Self-pay

## 2019-06-14 ENCOUNTER — Inpatient Hospital Stay (HOSPITAL_BASED_OUTPATIENT_CLINIC_OR_DEPARTMENT_OTHER): Payer: 59 | Admitting: Adult Health

## 2019-06-14 ENCOUNTER — Other Ambulatory Visit: Payer: Self-pay | Admitting: Adult Health

## 2019-06-14 ENCOUNTER — Inpatient Hospital Stay: Payer: 59

## 2019-06-14 VITALS — BP 117/70 | HR 77 | Temp 98.2°F | Resp 17 | Ht 65.0 in | Wt 237.1 lb

## 2019-06-14 DIAGNOSIS — C50311 Malignant neoplasm of lower-inner quadrant of right female breast: Secondary | ICD-10-CM

## 2019-06-14 DIAGNOSIS — Z170 Estrogen receptor positive status [ER+]: Secondary | ICD-10-CM

## 2019-06-14 DIAGNOSIS — Z95828 Presence of other vascular implants and grafts: Secondary | ICD-10-CM

## 2019-06-14 DIAGNOSIS — C50211 Malignant neoplasm of upper-inner quadrant of right female breast: Secondary | ICD-10-CM

## 2019-06-14 DIAGNOSIS — C73 Malignant neoplasm of thyroid gland: Secondary | ICD-10-CM

## 2019-06-14 DIAGNOSIS — Z5112 Encounter for antineoplastic immunotherapy: Secondary | ICD-10-CM | POA: Diagnosis not present

## 2019-06-14 LAB — CBC WITH DIFFERENTIAL/PLATELET
Abs Immature Granulocytes: 0.08 10*3/uL — ABNORMAL HIGH (ref 0.00–0.07)
Basophils Absolute: 0.1 10*3/uL (ref 0.0–0.1)
Basophils Relative: 1 %
Eosinophils Absolute: 0.1 10*3/uL (ref 0.0–0.5)
Eosinophils Relative: 1 %
HCT: 37.6 % (ref 36.0–46.0)
Hemoglobin: 12.5 g/dL (ref 12.0–15.0)
Immature Granulocytes: 1 %
Lymphocytes Relative: 20 %
Lymphs Abs: 1.5 10*3/uL (ref 0.7–4.0)
MCH: 30.4 pg (ref 26.0–34.0)
MCHC: 33.2 g/dL (ref 30.0–36.0)
MCV: 91.5 fL (ref 80.0–100.0)
Monocytes Absolute: 0.5 10*3/uL (ref 0.1–1.0)
Monocytes Relative: 7 %
Neutro Abs: 5.2 10*3/uL (ref 1.7–7.7)
Neutrophils Relative %: 70 %
Platelets: 193 10*3/uL (ref 150–400)
RBC: 4.11 MIL/uL (ref 3.87–5.11)
RDW: 17.9 % — ABNORMAL HIGH (ref 11.5–15.5)
WBC: 7.4 10*3/uL (ref 4.0–10.5)
nRBC: 0 % (ref 0.0–0.2)

## 2019-06-14 LAB — COMPREHENSIVE METABOLIC PANEL
ALT: 30 U/L (ref 0–44)
AST: 44 U/L — ABNORMAL HIGH (ref 15–41)
Albumin: 2.9 g/dL — ABNORMAL LOW (ref 3.5–5.0)
Alkaline Phosphatase: 84 U/L (ref 38–126)
Anion gap: 11 (ref 5–15)
BUN: 19 mg/dL (ref 6–20)
CO2: 26 mmol/L (ref 22–32)
Calcium: 8.8 mg/dL — ABNORMAL LOW (ref 8.9–10.3)
Chloride: 104 mmol/L (ref 98–111)
Creatinine, Ser: 1.34 mg/dL — ABNORMAL HIGH (ref 0.44–1.00)
GFR calc Af Amer: 53 mL/min — ABNORMAL LOW (ref >60)
GFR calc non Af Amer: 46 mL/min — ABNORMAL LOW (ref >60)
Glucose, Bld: 224 mg/dL — ABNORMAL HIGH (ref 70–99)
Potassium: 3.4 mmol/L — ABNORMAL LOW (ref 3.5–5.1)
Sodium: 141 mmol/L (ref 135–145)
Total Bilirubin: 0.2 mg/dL — ABNORMAL LOW (ref 0.3–1.2)
Total Protein: 7.7 g/dL (ref 6.5–8.1)

## 2019-06-14 LAB — URINALYSIS, COMPLETE (UACMP) WITH MICROSCOPIC
Bilirubin Urine: NEGATIVE
Glucose, UA: NEGATIVE mg/dL
Ketones, ur: NEGATIVE mg/dL
Nitrite: NEGATIVE
Protein, ur: >=300 mg/dL — AB
RBC / HPF: >50 RBC/hpf — ABNORMAL HIGH (ref 0–5)
Specific Gravity, Urine: 1.013 (ref 1.005–1.030)
WBC, UA: >50 WBC/hpf — ABNORMAL HIGH (ref 0–5)
pH: 6 (ref 5.0–8.0)

## 2019-06-14 MED ORDER — SODIUM CHLORIDE 0.9% FLUSH
10.0000 mL | INTRAVENOUS | Status: DC | PRN
  Administered 2019-06-14: 10 mL
  Filled 2019-06-14: qty 10

## 2019-06-14 MED ORDER — FLUCONAZOLE 100 MG PO TABS: ORAL_TABLET | ORAL | 0 refills | Status: DC

## 2019-06-14 NOTE — Progress Notes (Signed)
[place]  Telephone:([phone]) [phone] Fax:(336) [phone]    ID: [name] DOB: Aug 24, 1966  MR#: [id number]  XAJ#:[id number]  Patient Care Team: [name], NP as PCP - General (Internal Medicine) Michel Bickers, MD as PCP - Infectious Diseases (Infectious Diseases) Magrinat, Virgie Dad, MD as Consulting Physician (Oncology) Kyung Rudd, MD as Consulting Physician (Radiation Oncology) OTHER MD:   CHIEF COMPLAINT: Triple positive breast cancer  CURRENT TREATMENT: Adjuvant immunotherapy/adjuvant radiation   INTERVAL HISTORY: [name] returns today for follow-up and treatment of her triple positive breast cancer.  She continues on adjuvant TDM1. Today is day 1 cycle 8.   She also continues on tamoxifen.  She has hot flashes, and they are persistent.  She denies arthralgias,.  She does note vaginal discharge and feels as if she is developing a yeast infection.  She wants to know if I will call her in some Diflucan.    [name]'s last echocardiogram on 03/06/2019, showed an ejection fraction of 50%. She missed her recent echo and Dr. Haroldine Laws appointments.   Since her last visit, she underwent a CT a/p that showed a renal calculi.  She has occasional flank pain.    REVIEW OF SYSTEMS: [name] mentally is in a much better place than she was a few weeks ago.  She hasn't been working and is feeling much more positive.  She notes her anxiety and depression have improved tremendously.  Her shortness of breath, however, has continued and is slightly worse.  She notes it is worse on exertion.  She did not have her echo and f/u with Dr. Haroldine Laws as scheduled and recommended.  Her most recent chest xray was normal.  Her VS are normal today.  She does note an increase in urinary frequency.    [name] denies any fever or chills.  She has no chest pain, palpitations or cough.  She denies any nausea, vomiting, bowel hanges.  She is working on improving her activity level.  A  detailed ROS was otherwise non contributory.    HISTORY OF CURRENT ILLNESS: From the original intake note:  [name] (pronounced "Huff") palpated a mass in the right breast for about 2 weeks before bringing it to medical attention. She underwent bilateral diagnostic mammography with tomography and right breast ultrasonography at [facility] on 04/20/2018 showing: breast density category B. There was a highly suspicious mass located at 3 o'clock in the upper inner quadrant measuring 2.9 x 2.5 x 1.6 cm and 4 cm from the nipple. Ultrasound of the right axilla showed 5 lymph nodes with abnormal cortical thickening. The left axilla showed 3 lymph nodes with abnormal cortical thickening.  Accordingly on 04/26/2018 she proceeded to biopsy of the right breast area in question. The pathology from this procedure showed ([id number]): Invasive ductal carcinoma, grade II with calcifications. One right axillary lymph node and one left axillary lymph node were negative for carcinoma. Prognostic indicators significant for: estrogen receptor, 100% positive and progesterone receptor, 80% positive, both with strong staining intensity. Proliferation marker Ki67 at 70%. HER2 amplified with ratios HER2/CEP17 signals 4.52 and average HER2 copies per cell 10.85  The patient's subsequent history is as detailed below.  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   Diabetes mellitus without complication [facility])    Family  history of breast cancer    Family history of lung cancer    Family history of prostate cancer    Gastroenteritis 07/10/07   GERD 07/24/2006   GRAVE'S DISEASE 01/01/2008   History of hidradenitis suppurativa    History of kidney stones    History of thrush    HIV DISEASE 07/24/2006   dx March 05    Hyperlipidemia    HYPERTENSION 07/24/2006   Hyperthyroidism 08/2006   Grave's Disease -diffuse radiotracer uptake 08/25/06 Thyroid scan-Cold nodule to R lower lobe of thyrorid   Menometrorrhagia    hx of   Nephrolithiasis    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 Completed 2 yrs Prednisone after lung bx confirmation. No symptoms since then.   Suppurative hidradenitis    Thyroid cancer ([place])    THYROID NODULE, RIGHT 02/08/2007  -2018 thyroid nodules were precancerous but pathology was negative for thyroid cancer.   PAST SURGICAL HISTORY: Past Surgical History:  Procedure Laterality Date   BREAST EXCISIONAL BIOPSY Right 04/26/2018   right axilla negative   BREAST EXCISIONAL BIOPSY Left 04/26/2018   left axilla negative   BREAST LUMPECTOMY Right 10/03/2018   malignant   BREAST LUMPECTOMY WITH RADIOACTIVE SEED AND SENTINEL LYMPH NODE BIOPSY Right 10/03/2018   Procedure: RIGHT BREAST LUMPECTOMY WITH RADIOACTIVE SEED AND SENTINEL LYMPH NODE MAPPING;  Surgeon: Erroll Luna, MD;  Location: [facility];  Service: General;  Laterality: Right;   BREAST SURGERY  1997   Breast Reduction    CYSTOSCOPY W/ URETERAL STENT REMOVAL  11/09/2012   Procedure: CYSTOSCOPY WITH STENT REMOVAL;  Surgeon: Alexis Frock, MD;  Location: [facility];  Service: Urology;  Laterality: Right;   CYSTOSCOPY WITH RETROGRADE PYELOGRAM, URETEROSCOPY AND STENT PLACEMENT  11/09/2012   Procedure: CYSTOSCOPY WITH RETROGRADE PYELOGRAM, URETEROSCOPY AND STENT PLACEMENT;  Surgeon: Alexis Frock, MD;  Location[facility];  Service: Urology;  Laterality: Left;  LEFT URETEROSCOPY, STONE MANIPULATION, left STENT exchange    CYSTOSCOPY WITH STENT  PLACEMENT  10/02/2012   Procedure: CYSTOSCOPY WITH STENT PLACEMENT;  Surgeon: Alexis Frock, MD;  [facility];  Service: Urology;  Laterality: Left;   DILATION AND CURETTAGE OF UTERUS  Feb 2004   s/p for 1st trimester nonviable pregnancy   EYE SURGERY     sty under eyelid   INCISE AND DRAIN ABCESS  Nov 03   s/p I &D for righ inframmary fold hidradenitis   INCISION AND DRAINAGE PERITONSILLAR ABSCESS  Mar 03   IR CV LINE INJECTION  06/07/2018   IR IMAGING GUIDED PORT INSERTION  06/20/2018   IR REMOVAL TUN ACCESS W/ PORT W/O FL MOD SED  06/20/2018   IRRIGATION AND DEBRIDEMENT ABSCESS  01/31/2012   Procedure: IRRIGATION AND DEBRIDEMENT ABSCESS;  Surgeon: Shann Medal, MD;  [facility];  Service: General;  Laterality: Right;  right breast and axilla    NEPHROLITHOTOMY  10/02/2012   Procedure: NEPHROLITHOTOMY PERCUTANEOUS;  Surgeon: Alexis Frock, MD;  [facility];  Service: Urology;  Laterality: Right;  First Stage Percutaneous Nephrolithotomy with Surgeon Access, Left Ureteral Stent     NEPHROLITHOTOMY  10/04/2012   Procedure: NEPHROLITHOTOMY PERCUTANEOUS SECOND LOOK;  Surgeon: Alexis Frock, MD;  Location: [facility];  Service: Urology;  Laterality: Right;      NEPHROLITHOTOMY  10/08/2012   Procedure: NEPHROLITHOTOMY PERCUTANEOUS;  Surgeon: Alexis Frock, MD;  [facility];  Service: Urology;  Laterality: Right;  THIRD STAGE, nephrostomy tube exchange x 2   NEPHROLITHOTOMY  10/11/2012   Procedure: NEPHROLITHOTOMY PERCUTANEOUS SECOND LOOK;  Surgeon: Alexis Frock, MD;  [facility];  Service: Urology;  Laterality: Right;  RIGHT 4 STAGE PERCUTANOUS NEPHROLITHOTOMY, right URETEROSCOPY WITH HOLMIUM LASER    PORTACATH PLACEMENT Left 05/17/2018   Procedure: INSERTION PORT-A-CATH;  Surgeon: Coralie Keens, MD;  Location: [facility];  Service: General;  Laterality: Left;   RADICAL NECK DISSECTION  04/12/2017   limited/notes 04/12/2017   RADICAL NECK  DISSECTION N/A 04/12/2017   Procedure: RADICAL NECK DISSECTION;  Surgeon: Melida Quitter, MD;  Location: [facility];  Service: ENT;  Laterality: N/A;  limited neck dissection 2 hours total   REDUCTION MAMMAPLASTY Bilateral [place]  04/12/2017   completion/notes 04/12/2017   THYROIDECTOMY N/A 04/12/2017   Procedure: THYROIDECTOMY;  Surgeon: Melida Quitter, MD;  Location: [facility];  Service: ENT;  Laterality: N/A;  Completion Thyroidectomy   TOTAL THYROIDECTOMY  2010  Thyroid nodules removed- pathology at the ENT doctor 2018 were benign -Over active thyroid and thyroidectomy with benign pathology  FAMILY HISTORY Family History  Problem Relation Age of Onset   Hypertension Mother    Cancer Mother        laryngeal   Heart disease Mother        stent   Hypertension Father    Lung cancer Father 41       hx smoking   Heart disease Other    Hypertension Other    Stroke Other        Grandparent   Kidney disease Other        Grandparent   Diabetes Other        FH of Diabetes   Hypertension Sister    Cancer Maternal Uncle        Lung CA   Hypertension Brother    Hypertension Sister    Breast cancer Maternal Aunt 65   Breast cancer Paternal Aunt 15   Prostate cancer Paternal Uncle    Breast cancer Maternal Aunt        dx 60+   Breast cancer Paternal Aunt        dx 66's   Breast cancer Paternal Aunt        dx 50's   Prostate cancer Paternal Uncle    Lung cancer Paternal Uncle    Breast cancer Cousin 42   Breast cancer Cousin        dx <50   Breast cancer Cousin        dx <50   Breast cancer Cousin        dx <50   Colon cancer Neg Hx    Esophageal cancer Neg Hx    Rectal cancer Neg Hx    Stomach cancer Neg Hx   The patient's mother is alive at age 72. The patient's father died at 53 from lung cancer (heavy smoker). She does not know much about her father. The patient has 1 brother and 2 sisters. The patient's mother had a  history of laryngeal cancer. There was a maternal cousin with breast cancer diagnosed at age 86. There were 2 maternal aunts with breast cancer, the youngest being diagnosed in the 70's. There was  a maternal great aunt with breast cancer. There were 2 paternal aunts with breast cancer.    GYNECOLOGIC HISTORY:  Patient's last menstrual period was 03/31/2014. Menarche: 53 years old Age at first live birth: 53 years old She is GX P1.  Her LMP was in 2017. She did not use HRT.    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 At home is the patient's daughter, Verdie Drown, age 28, who is a Engineer, technical sales education and working part-time at the [employer]. The patient attends [employer].   ADVANCED DIRECTIVES: Not in place; at the 05/10/2018 visit the patient was given the appropriate documents to complete and notarized at her discretion   HEALTH MAINTENANCE: Social History   Tobacco Use   Smoking status: Former Smoker    Packs/day: 0.50    Years: 15.00    Pack years: 7.50    Types: Cigarettes    Start date: 04/12/2017   Smokeless tobacco: Never Used   Tobacco comment: quit smoking  may 2018  Substance Use Topics   Alcohol use: Yes    Alcohol/week: 0.0 standard drinks    Comment: social   Drug use: No     Colonoscopy: no  PAP: [facility]. 2018  Bone density: remote   Allergies  Allergen Reactions   Genvoya [Elviteg-Cobic-Emtricit-Tenofaf] Hives   Lisinopril Cough   Valsartan Other (See Comments) and Cough    Pt states she tolerates medicine now    Current Outpatient Medications  Medication Sig Dispense Refill   amLODipine (NORVASC) 10 MG tablet Take 1 tablet (10 mg total) by mouth daily. For blood pressure. 90 tablet 3   cephALEXin (KEFLEX) 500 MG capsule Take 1 capsule (500 mg total) by mouth 3 (three) times daily. 21 capsule 0   DESCOVY 200-25 MG tablet TAKE 1 TABLET BY MOUTH EVERY DAY 30 tablet 1    doxycycline (VIBRA-TABS) 100 MG tablet 100 mg daily.      fluconazole (DIFLUCAN) 100 MG tablet 2 tablets for first dose then 1 tab a day x 2 days 4 tablet 0   fluticasone (FLOVENT HFA) 110 MCG/ACT inhaler Inhale 1 puff into the lungs 2 (two) times daily. 1 Inhaler 2   glipiZIDE (GLUCOTROL) 5 MG tablet Take 1 tablet (5 mg total) by mouth 2 (two) times daily before a meal. For diabetes. 180 tablet 0   irbesartan-hydrochlorothiazide (AVALIDE) 150-12.5 MG tablet TAKE 1 TABLET BY MOUTH ONCE DAILY FOR BLOOD PRESSURE. 90 tablet 1   levothyroxine (SYNTHROID) 175 MCG tablet TAKE 1 TABLET BY MOUTH EVERY MORNING ON AN EMPTY STOMACH WITH A FULL GLASS OF WATER. 90 tablet 0   metoprolol succinate (TOPROL-XL) 50 MG 24 hr tablet TAKE 1 TABLET (50 MG TOTAL) BY MOUTH DAILY. TAKE WITH OR IMMEDIATELY FOLLOWING A MEAL. 90 tablet 0   Multiple Vitamin (MULTIVITAMIN) tablet Take 1 tablet by mouth daily.     ONE TOUCH LANCETS MISC Use as instructed to test blood sugar daily 200 each 5   ONETOUCH VERIO test strip USE AS INSTRUCTED TO TEST BLOOD SUGAR DAILY NEED APPOINTMENT FOR ANY MORE REFILLS 50 each 0   potassium chloride (K-DUR) 10 MEQ tablet TAKE 2 TABLETS (20 MEQ TOTAL) BY MOUTH 2 (TWO) TIMES DAILY. 60 tablet 1   rosuvastatin (CRESTOR) 10 MG tablet Take 1 tablet (10 mg total) by mouth daily. For cholesterol. 90 tablet 0   tamoxifen (NOLVADEX) 20 MG tablet Take 1 tablet (20 mg total) by mouth daily.  30 tablet 5   [name] 50 MG tablet TAKE 1 TABLET BY MOUTH EVERY DAY 30 tablet 1   traMADol (ULTRAM) 50 MG tablet Take 1 tablet (50 mg total) by mouth every 6 (six) hours as needed. 30 tablet 0   No current facility-administered medications for this visit.    Facility-Administered Medications Ordered in Other Visits  Medication Dose Route Frequency Provider Last Rate Last Dose   sodium chloride flush (NS) 0.9 % injection 10 mL  10 mL Intracatheter PRN Magrinat, Virgie Dad, MD   10 mL at 09/19/18 1551     OBJECTIVE:  Vitals:   06/14/19 0946  BP: 117/70  Pulse: 77  Resp: 17  Temp: 98.2 F (36.8 C)  SpO2: 99%     Body mass index is 39.46 kg/m.   Wt Readings from Last 3 Encounters:  06/14/19 237 lb 1.6 oz (107.5 kg)  06/05/19 235 lb (106.6 kg)  05/24/19 234 lb 11.2 oz (106.5 kg)  ECOG FS:1 - Symptomatic but completely ambulatory GENERAL: Patient is a well appearing female in no acute distress HEENT:  Sclerae anicteric.  Oropharynx clear and moist. No ulcerations or evidence of oropharyngeal candidiasis. Neck is supple.  NODES:  No cervical, supraclavicular, or axillary lymphadenopathy palpated.  BREAST EXAM: deferred LUNGS:  Clear to auscultation bilaterally.  No wheezes or rhonchi. HEART:  Regular rate and rhythm. No murmur appreciated. ABDOMEN:  Soft, nontender.  Positive, normoactive bowel sounds. No organomegaly palpated. MSK:  No focal spinal tenderness to palpation. Full range of motion bilaterally in the upper extremities. EXTREMITIES:  No peripheral edema.   SKIN:  Clear with no obvious rashes or skin changes. No nail dyscrasia. NEURO:  Nonfocal. Well oriented.  Appropriate affect.    LAB RESULTS:  CMP     Component Value Date/Time   NA 140 06/05/2019 1128   K 3.8 06/05/2019 1128   CL 103 06/05/2019 1128   CO2 28 06/05/2019 1128   GLUCOSE 103 (H) 06/05/2019 1128   BUN 21 06/05/2019 1128   CREATININE 1.47 (H) 06/05/2019 1128   CREATININE 1.28 (H) 05/10/2018 1458   CREATININE 1.02 09/23/2015 1047   CALCIUM 8.9 06/05/2019 1128   PROT 8.1 05/24/2019 0918   ALBUMIN 2.9 (L) 05/24/2019 0918   AST 46 (H) 05/24/2019 0918   AST 28 05/10/2018 1458   ALT 30 05/24/2019 0918   ALT 23 05/10/2018 1458   ALKPHOS 89 05/24/2019 0918   BILITOT <0.2 (L) 05/24/2019 0918   BILITOT <0.2 (L) 05/10/2018 1458   GFRNONAA 29 (L) 05/24/2019 0918   GFRNONAA 48 (L) 05/10/2018 1458   GFRAA 33 (L) 05/24/2019 0918   GFRAA 56 (L) 05/10/2018 1458    No results found for:  TOTALPROTELP, ALBUMINELP, A1GS, A2GS, BETS, BETA2SER, GAMS, MSPIKE, SPEI  No results found for: KPAFRELGTCHN, LAMBDASER, KAPLAMBRATIO  Lab Results  Component Value Date   WBC 7.4 06/14/2019   NEUTROABS 5.2 06/14/2019   HGB 12.5 06/14/2019   HCT 37.6 06/14/2019   MCV 91.5 06/14/2019   PLT 193 06/14/2019    _0 @  No results found for: LABCA2  No components found for: [id number]  No results for input(s): INR in the last 168 hours.  No results found for: LABCA2  No results found for: KDX833  No results found for: ASN053  No results found for: ZJQ734  No results found for: CA2729  No components found for: HGQUANT  No results found for: CEA1 / No results found for: CEA1   No results  found for: AFPTUMOR  No results found for: [place]  No results found for: PSA1  Appointment on 06/14/2019  Component Date Value Ref Range Status   WBC 06/14/2019 7.4  4.0 - 10.5 K/uL Final   RBC 06/14/2019 4.11  3.87 - 5.11 MIL/uL Final   Hemoglobin 06/14/2019 12.5  12.0 - 15.0 g/dL Final   HCT 06/14/2019 37.6  36.0 - 46.0 % Final   MCV 06/14/2019 91.5  80.0 - 100.0 fL Final   MCH 06/14/2019 30.4  26.0 - 34.0 pg Final   MCHC 06/14/2019 33.2  30.0 - 36.0 g/dL Final   RDW 06/14/2019 17.9* 11.5 - 15.5 % Final   Platelets 06/14/2019 193  150 - 400 K/uL Final   nRBC 06/14/2019 0.0  0.0 - 0.2 % Final   Neutrophils Relative % 06/14/2019 70  % Final   Neutro Abs 06/14/2019 5.2  1.7 - 7.7 K/uL Final   Lymphocytes Relative 06/14/2019 20  % Final   Lymphs Abs 06/14/2019 1.5  0.7 - 4.0 K/uL Final   Monocytes Relative 06/14/2019 7  % Final   Monocytes Absolute 06/14/2019 0.5  0.1 - 1.0 K/uL Final   Eosinophils Relative 06/14/2019 1  % Final   Eosinophils Absolute 06/14/2019 0.1  0.0 - 0.5 K/uL Final   Basophils Relative 06/14/2019 1  % Final   Basophils Absolute 06/14/2019 0.1  0.0 - 0.1 K/uL Final   Immature Granulocytes 06/14/2019 1  % Final   Abs Immature  Granulocytes 06/14/2019 0.08* 0.00 - 0.07 K/uL Final   Performed at [facility], [address] [place], [place] [address]    (this displays the last labs from the last 3 days)  No results found for: TOTALPROTELP, ALBUMINELP, A1GS, A2GS, BETS, BETA2SER, GAMS, MSPIKE, SPEI (this displays SPEP labs)  No results found for: KPAFRELGTCHN, LAMBDASER, KAPLAMBRATIO (kappa/lambda light chains)  No results found for: HGBA, HGBA2QUANT, HGBFQUANT, HGBSQUAN (Hemoglobinopathy evaluation)   No results found for: LDH  Lab Results  Component Value Date   IRON 26 (L) 02/08/2007   TIBC 371 02/08/2007   IRONPCTSAT 7 (L) 02/08/2007   (Iron and TIBC)  No results found for: FERRITIN  Urinalysis    Component Value Date/Time   COLORURINE YELLOW 05/18/2019 2348   APPEARANCEUR HAZY (A) 05/18/2019 2348   LABSPEC 1.013 05/18/2019 2348   PHURINE 6.0 05/18/2019 2348   GLUCOSEU NEGATIVE 05/18/2019 2348   GLUCOSEU NEG mg/dL 01/23/2008 1418   HGBUR MODERATE (A) 05/18/2019 2348   BILIRUBINUR NEGATIVE 05/18/2019 2348   BILIRUBINUR negative 12/29/2017 [place] 05/18/2019 2348   PROTEINUR >=300 (A) 05/18/2019 2348   UROBILINOGEN 0.2 12/29/2017 1046   UROBILINOGEN 0.2 10/21/2014 2347   NITRITE NEGATIVE 05/18/2019 2348   LEUKOCYTESUR LARGE (A) 05/18/2019 2348     STUDIES: Ct Abdomen Pelvis Wo Contrast  Result Date: 06/03/2019 CLINICAL DATA:  Right flank pain. Nephrolithiasis. EXAM: CT ABDOMEN AND PELVIS WITHOUT CONTRAST TECHNIQUE: Multidetector CT imaging of the abdomen and pelvis was performed following the standard protocol without IV contrast. COMPARISON:  10/22/2014 FINDINGS: Lower chest: No acute findings. Hepatobiliary: No mass visualized on this unenhanced exam. Mild hepatic steatosis. Gallbladder is unremarkable. No evidence of biliary ductal dilatation. Pancreas: No mass or inflammatory process visualized on this unenhanced exam. Spleen:  Within normal  limits in size. Adrenals/Urinary tract: Several partial staghorn calculi are again seen within the right renal collecting system, without significant change compared to previous study. No evidence ureteral calculi or hydronephrosis. Stomach/Bowel:  No evidence of obstruction, inflammatory process, or abnormal fluid collections. Mild sigmoid diverticulosis noted, without evidence of diverticulitis. Vascular/Lymphatic: Tiny sub-cm abdominal retroperitoneal lymph nodes remain stable, consistent with benign etiology. No pathologically enlarged lymph nodes identified. No evidence of abdominal aortic aneurysm. Reproductive:  No mass or other significant abnormality. Other:  None. Musculoskeletal:  No suspicious bone lesions identified. IMPRESSION: 1. 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 No evidence of ureteral calculi, hydronephrosis, or other acute findings. 2. Colonic diverticulosis, without radiographic evidence of diverticulitis. Electronically Signed   By: Marlaine Hind M.D.   On: 06/03/2019 14:17   Dg Chest 2 View  Result Date: 05/24/2019 CLINICAL DATA:  Shortness of breath, HIV and breast cancer EXAM: CHEST - 2 VIEW COMPARISON:  09/19/2018, chest CT 09/27/2018 FINDINGS: Left-sided central venous port tip over the cavoatrial region. No acute consolidation or effusion. Normal cardiomediastinal silhouette. No pneumothorax. Vague sclerotic focus projects over right fifth rib anterolaterally, appears to correspond to rib fracture on CT September 27, 2018. Postsurgical changes at the thoracic inlet. IMPRESSION: No active cardiopulmonary disease. Electronically Signed   By: Donavan Foil M.D.   On: 05/24/2019 14:52   Dg Thoracic Spine 2 View  Result Date: 05/24/2019 CLINICAL DATA:  Back pain, history of breast cancer EXAM: THORACIC SPINE 2 VIEWS COMPARISON:  Chest x-ray 09/19/2018, CT chest 09/27/2018 FINDINGS: There is no evidence of thoracic spine fracture. Alignment is normal. No other  significant bone abnormalities are identified. Minimal degenerative osteophytes. IMPRESSION: No acute osseous abnormality Electronically Signed   By: Donavan Foil M.D.   On: 05/24/2019 14:53   Dg Lumbar Spine 2-3 Views  Result Date: 05/24/2019 CLINICAL DATA:  Back pain history of breast cancer EXAM: LUMBAR SPINE - 2-3 VIEW COMPARISON:  CT 10/22/2014, radiograph 10/14/2014 FINDINGS: Transitional anatomy with 4 non rib-bearing lumbar type vertebra. For the purposes of reporting, first non rib-bearing lumbar vertebra will be designated L1. Alignment is normal. Vertebral bodies demonstrate normal stature. Disc spaces are preserved. Aortic atherosclerosis. Partially visualized large staghorn calculi in the right kidney. IMPRESSION: 1. No acute osseous abnormality 2. Partially visualized large staghorn calculi in the right kidney Electronically Signed   By: Donavan Foil M.D.   On: 05/24/2019 14:56   Mm Diag Breast Tomo Bilateral  Result Date: 06/06/2019 CLINICAL DATA:  Right lumpectomy.  Annual mammography. EXAM: DIGITAL DIAGNOSTIC BILATERAL MAMMOGRAM WITH CAD AND TOMO COMPARISON:  Previous exam(s). ACR Breast Density Category b: There are scattered areas of fibroglandular density. FINDINGS: The right lumpectomy site appears as expected. Enlarged lymph nodes in the axilla, left greater than right, have been previously evaluated and noted to be reactive. No other suspicious findings are seen in either breast. Mammographic images were processed with CAD. IMPRESSION: No mammographic evidence of malignancy in either breast. RECOMMENDATION: Annual diagnostic mammography. I have discussed the findings and recommendations with the patient. Results were also provided in writing at the conclusion of the visit. If applicable, a reminder letter will be sent to the patient regarding the next appointment. BI-RADS CATEGORY  2: Benign. Electronically Signed   By: Dorise Bullion III M.D   On: 06/06/2019 12:04     ELIGIBLE  FOR AVAILABLE RESEARCH PROTOCOL: UPBEAT   ASSESSMENT: 53 y.o. [name], [name] woman status post right breast upper inner quadrant biopsy 04/26/2018 for a clinical T2 N0, stage IB invasive ductal carcinoma, grade 2, ER/PR positive, HER-2 positive with an MIB-1 of 70%.  (1) genetics testing 07/03/2018: no pathogenic mutations. Genes Analyzed (43  total): APC*, ATM*, AXIN2, BARD1, BMPR1A, BRCA1*, BRCA2*, BRIP1*, CDH1*, CDK4, CDKN2A, CHEK2*, DICER1,GALNT12, HOXB13, MEN1, MLH1*, MRE11A, MSH2*, MSH3, MSH6*, MUTYH*, NBN, NF1*, NTHL1, PALB2*, PMS2*, POLD1, POLE,PTEN*, RAD50, RAD51C*, RAD51D*, RET, SDHB, SDHD, SMAD4, SMARCA4, STK11 and TP53* (sequencing and deletion/duplication); EGFR (sequencing only); EPCAM and GREM1 (deletion/duplication only). DNA and RNA analyses performed for * genes.  (2) neoadjuvant chemotherapy consisting of carboplatin, docetaxel, trastuzumab and pertuzumab given every 21 days x 6, starting 05/31/2018, completed 09/14/2018  (a) Pertuzumab discontinued starting with cycle 2 because of diarrhea problems  (b) Taxotere stopped after 4 cycles due peripheral neuropathy and substituted for last two cycles with gemcitabine  (3)  definitive surgery on 10/03/2018 showed residual ypT1c ypN0 invasive ductal carcinoma, grade 2, with negative margins; a total of 5 sentinel lymph nodes removed; repeat prognostic panel again triple positive  (4) T-DM1 given adjuvantly due to residual disease: started 11/13/2018.  (a) echo on 05/16/2018 shows an EF of 60-65%.  (b) echo on 08/27/18 shows an EF 55-60%  (c) echo on 12/03/2018 shows an EF of 60-65%  (d) echocardiogram on 03/06/2019 showed an ejection fraction of 55% as read by Dr. Haroldine Laws.  (5) adjuvant radiation completed 01/07/2019 - 02/21/2019 (delayed due to wound healing issues)  Site/dose:   The patient initially received a dose of 50.4 Gy in 28 fractions to the right breast using whole-breast tangent fields. This was delivered using a 3-D  conformal technique. The patient then received a boost to the seroma. This delivered an additional 10 Gy in 5 fractions using 12e electrons with a special teletherapy technique. The total dose was 60.4 Gy.  (6) Tamoxifen started 02/28/2019  (7) hidradenitis/boils: on doxycycline daily   PLAN: [name] is doing better mentally today.  She however, continues to have increased shortness of breath and hasn't gone for her repeat echocardiogram as scheduled.  She was recommended to go ahead and have this done.  We will hold treatment considering her symptoms, and wait until we get this scheduled.  She will also need to get back in with Dr. Haroldine Laws.  I have written for her to take Diflucan for her vaginal yeast.  She also was recommended to take Gabapentin for her hot flashes and see if it helps. She has some of these at home and will call me if she wants [place] to send in a script for these.  We will get a urinalysis and culture to evaluate her urinary frequency.  We will see [name] once we get her echo scheduled to get her back on track with treatment.  I let her know that keeping up with her appointments is important so we can keep her on her treatments.  [name] was recommended to continue with the appropriate pandemic precautions. She knows to call for any questions or concerns that may arise between now and her next appointment.  A total of (30) minutes of face-to-face time was spent with this patient with greater than 50% of that time in counseling and care-coordination.    Wilber Bihari, NP  06/14/19 9:59 AM Medical Oncology and Hematology [facility] [address] [place], [place] [address] Tel. [phone]    Fax. [phone]

## 2019-06-17 ENCOUNTER — Encounter (HOSPITAL_COMMUNITY): Payer: 59 | Admitting: Internal Medicine

## 2019-06-17 ENCOUNTER — Other Ambulatory Visit (HOSPITAL_COMMUNITY): Payer: 59

## 2019-06-17 ENCOUNTER — Telehealth: Payer: Self-pay

## 2019-06-17 ENCOUNTER — Other Ambulatory Visit: Payer: Self-pay | Admitting: Adult Health

## 2019-06-17 DIAGNOSIS — R350 Frequency of micturition: Secondary | ICD-10-CM

## 2019-06-17 LAB — URINE CULTURE: Culture: 50000 — AB

## 2019-06-17 MED ORDER — CIPROFLOXACIN HCL 500 MG PO TABS: 500.0000 mg | ORAL_TABLET | Freq: Two times a day (BID) | ORAL | 0 refills | Status: DC

## 2019-06-17 NOTE — Telephone Encounter (Signed)
Spoke with patient to see if she is currently on an ABT.  She currently takes doxycycline daily.  Urine culture results show many bacteria.  Per NP, Cipro 500 mg PO BID x 5 days sent in to [facility] for patient.  Patient voiced understanding.  No questions at this time.

## 2019-06-17 NOTE — Telephone Encounter (Signed)
-----   Message from Gardenia Phlegm, NP sent at 06/17/2019  1:04 PM EDT ----- Please call patient.  Is she taking any antibiotics? ----- Message ----- From: Buel Ream, [facility] [place] Sent: 06/14/2019  11:17 AM EDT To: Gardenia Phlegm, NP

## 2019-06-18 ENCOUNTER — Other Ambulatory Visit: Payer: Self-pay | Admitting: Primary Care

## 2019-06-18 DIAGNOSIS — E890 Postprocedural hypothyroidism: Secondary | ICD-10-CM

## 2019-06-20 ENCOUNTER — Ambulatory Visit (HOSPITAL_COMMUNITY)
Admission: RE | Admit: 2019-06-20 | Discharge: 2019-06-20 | Disposition: A | Discharge status: Home / Self Care | Payer: 59 | Source: Ambulatory Visit | Attending: Adult Health | Admitting: Adult Health

## 2019-06-20 ENCOUNTER — Other Ambulatory Visit: Payer: Self-pay

## 2019-06-20 DIAGNOSIS — F17200 Nicotine dependence, unspecified, uncomplicated: Secondary | ICD-10-CM | POA: Diagnosis not present

## 2019-06-20 DIAGNOSIS — E785 Hyperlipidemia, unspecified: Secondary | ICD-10-CM | POA: Diagnosis not present

## 2019-06-20 DIAGNOSIS — E119 Type 2 diabetes mellitus without complications: Secondary | ICD-10-CM | POA: Diagnosis not present

## 2019-06-20 DIAGNOSIS — C50311 Malignant neoplasm of lower-inner quadrant of right female breast: Secondary | ICD-10-CM | POA: Diagnosis not present

## 2019-06-20 DIAGNOSIS — I313 Pericardial effusion (noninflammatory): Secondary | ICD-10-CM | POA: Diagnosis not present

## 2019-06-20 DIAGNOSIS — B20 Human immunodeficiency virus [HIV] disease: Secondary | ICD-10-CM | POA: Insufficient documentation

## 2019-06-20 DIAGNOSIS — I10 Essential (primary) hypertension: Secondary | ICD-10-CM | POA: Insufficient documentation

## 2019-06-20 DIAGNOSIS — Z170 Estrogen receptor positive status [ER+]: Secondary | ICD-10-CM | POA: Diagnosis not present

## 2019-06-20 NOTE — Progress Notes (Signed)
  Echocardiogram 2D Echocardiogram has been performed.  [name] 06/20/2019, 10:59 AM

## 2019-06-28 ENCOUNTER — Other Ambulatory Visit: Payer: Self-pay

## 2019-06-28 ENCOUNTER — Other Ambulatory Visit: Payer: Self-pay | Admitting: Internal Medicine

## 2019-06-28 ENCOUNTER — Ambulatory Visit (HOSPITAL_COMMUNITY)
Admission: RE | Admit: 2019-06-28 | Discharge: 2019-06-28 | Disposition: A | Discharge status: Home / Self Care | Payer: 59 | Source: Ambulatory Visit | Attending: Internal Medicine | Admitting: Internal Medicine

## 2019-06-28 ENCOUNTER — Telehealth: Payer: Self-pay | Admitting: Oncology

## 2019-06-28 VITALS — BP 152/80 | HR 87

## 2019-06-28 DIAGNOSIS — I2510 Atherosclerotic heart disease of native coronary artery without angina pectoris: Secondary | ICD-10-CM | POA: Diagnosis not present

## 2019-06-28 DIAGNOSIS — R0600 Dyspnea, unspecified: Secondary | ICD-10-CM | POA: Insufficient documentation

## 2019-06-28 DIAGNOSIS — Z85850 Personal history of malignant neoplasm of thyroid: Secondary | ICD-10-CM | POA: Diagnosis not present

## 2019-06-28 DIAGNOSIS — N183 Chronic kidney disease, stage 3 unspecified: Secondary | ICD-10-CM

## 2019-06-28 DIAGNOSIS — Z7984 Long term (current) use of oral hypoglycemic drugs: Secondary | ICD-10-CM | POA: Diagnosis not present

## 2019-06-28 DIAGNOSIS — C50211 Malignant neoplasm of upper-inner quadrant of right female breast: Secondary | ICD-10-CM | POA: Insufficient documentation

## 2019-06-28 DIAGNOSIS — Z170 Estrogen receptor positive status [ER+]: Secondary | ICD-10-CM

## 2019-06-28 DIAGNOSIS — Z87442 Personal history of urinary calculi: Secondary | ICD-10-CM | POA: Insufficient documentation

## 2019-06-28 DIAGNOSIS — I10 Essential (primary) hypertension: Secondary | ICD-10-CM | POA: Diagnosis not present

## 2019-06-28 DIAGNOSIS — Z79810 Long term (current) use of selective estrogen receptor modulators (SERMs): Secondary | ICD-10-CM | POA: Insufficient documentation

## 2019-06-28 DIAGNOSIS — Z9221 Personal history of antineoplastic chemotherapy: Secondary | ICD-10-CM | POA: Diagnosis not present

## 2019-06-28 DIAGNOSIS — Z79899 Other long term (current) drug therapy: Secondary | ICD-10-CM | POA: Insufficient documentation

## 2019-06-28 DIAGNOSIS — E669 Obesity, unspecified: Secondary | ICD-10-CM | POA: Insufficient documentation

## 2019-06-28 DIAGNOSIS — Z21 Asymptomatic human immunodeficiency virus [HIV] infection status: Secondary | ICD-10-CM | POA: Insufficient documentation

## 2019-06-28 DIAGNOSIS — Z7951 Long term (current) use of inhaled steroids: Secondary | ICD-10-CM | POA: Diagnosis not present

## 2019-06-28 DIAGNOSIS — Z803 Family history of malignant neoplasm of breast: Secondary | ICD-10-CM | POA: Insufficient documentation

## 2019-06-28 DIAGNOSIS — C50311 Malignant neoplasm of lower-inner quadrant of right female breast: Secondary | ICD-10-CM | POA: Diagnosis not present

## 2019-06-28 DIAGNOSIS — J45909 Unspecified asthma, uncomplicated: Secondary | ICD-10-CM | POA: Insufficient documentation

## 2019-06-28 DIAGNOSIS — E1122 Type 2 diabetes mellitus with diabetic chronic kidney disease: Secondary | ICD-10-CM | POA: Diagnosis not present

## 2019-06-28 DIAGNOSIS — Z79890 Hormone replacement therapy (postmenopausal): Secondary | ICD-10-CM | POA: Diagnosis not present

## 2019-06-28 DIAGNOSIS — R0602 Shortness of breath: Secondary | ICD-10-CM

## 2019-06-28 DIAGNOSIS — Z823 Family history of stroke: Secondary | ICD-10-CM | POA: Diagnosis not present

## 2019-06-28 DIAGNOSIS — Z8249 Family history of ischemic heart disease and other diseases of the circulatory system: Secondary | ICD-10-CM | POA: Insufficient documentation

## 2019-06-28 DIAGNOSIS — R0683 Snoring: Secondary | ICD-10-CM | POA: Insufficient documentation

## 2019-06-28 DIAGNOSIS — Z87891 Personal history of nicotine dependence: Secondary | ICD-10-CM | POA: Diagnosis not present

## 2019-06-28 DIAGNOSIS — Z841 Family history of disorders of kidney and ureter: Secondary | ICD-10-CM | POA: Diagnosis not present

## 2019-06-28 DIAGNOSIS — Z801 Family history of malignant neoplasm of trachea, bronchus and lung: Secondary | ICD-10-CM | POA: Diagnosis not present

## 2019-06-28 DIAGNOSIS — Z923 Personal history of irradiation: Secondary | ICD-10-CM | POA: Insufficient documentation

## 2019-06-28 DIAGNOSIS — Z888 Allergy status to other drugs, medicaments and biological substances status: Secondary | ICD-10-CM | POA: Insufficient documentation

## 2019-06-28 DIAGNOSIS — I129 Hypertensive chronic kidney disease with stage 1 through stage 4 chronic kidney disease, or unspecified chronic kidney disease: Secondary | ICD-10-CM | POA: Insufficient documentation

## 2019-06-28 DIAGNOSIS — E890 Postprocedural hypothyroidism: Secondary | ICD-10-CM | POA: Diagnosis not present

## 2019-06-28 DIAGNOSIS — B20 Human immunodeficiency virus [HIV] disease: Secondary | ICD-10-CM

## 2019-06-28 LAB — BASIC METABOLIC PANEL
Anion gap: 12 (ref 5–15)
BUN: 23 mg/dL — ABNORMAL HIGH (ref 6–20)
CO2: 27 mmol/L (ref 22–32)
Calcium: 9.2 mg/dL (ref 8.9–10.3)
Chloride: 99 mmol/L (ref 98–111)
Creatinine, Ser: 1.71 mg/dL — ABNORMAL HIGH (ref 0.44–1.00)
GFR calc Af Amer: 39 mL/min — ABNORMAL LOW (ref >60)
GFR calc non Af Amer: 34 mL/min — ABNORMAL LOW (ref >60)
Glucose, Bld: 260 mg/dL — ABNORMAL HIGH (ref 70–99)
Potassium: 3.1 mmol/L — ABNORMAL LOW (ref 3.5–5.1)
Sodium: 138 mmol/L (ref 135–145)

## 2019-06-28 LAB — BRAIN NATRIURETIC PEPTIDE: B Natriuretic Peptide: 19.1 pg/mL (ref 0.0–100.0)

## 2019-06-28 MED ORDER — SPIRONOLACTONE 25 MG PO TABS: 25.0000 mg | ORAL_TABLET | Freq: Every day | ORAL | 6 refills | Status: DC

## 2019-06-28 MED ORDER — DESCOVY 200-25 MG PO TABS: 1.0000 | ORAL_TABLET | Freq: Every day | ORAL | 2 refills | Status: DC

## 2019-06-28 MED ORDER — FARXIGA 10 MG PO TABS: 10.0000 mg | ORAL_TABLET | Freq: Every day | ORAL | 6 refills | Status: DC

## 2019-06-28 NOTE — Telephone Encounter (Signed)
Added appointments for September through November per 9/9 schedule message. Confirmed with patient.   Per patient she received word from her employer that they have not yet received her disability information and office notes. Per patient her employer states her benefits expire this Sunday and if information is not received she will not receive her payment. Per patient she has been trying to get this settled for about a month and needs to have it taken care of today. Per patient she is stressed out and does not know what she will do if her benefits are lost. Patient informed this message would be routed to disability person. I will also see if I can catch up with Cecille Rubin to touch base with Patient.

## 2019-06-28 NOTE — Patient Instructions (Signed)
Start Spironolactone 25 mg daily  Start Farxiga 10 mg daily  Labs done today  Chest x-ray done today  Your provider has recommended that you have a home sleep study.  [employer] is the company that does these test.  They will contact you by phone and must speak with you before they can ship the equipment.  Once they have spoken with you they will send the equipment right to your home with instructions on how to set it up.  Once you have completed the test you just dispose of the equipment, the information is automatically uploaded to [place] via blue-tooth technology.  IF you have any questions or issues with the equipment please call the company directly at [phone].  If your test is positive for sleep apnea and you need a home CPAP machine you will be contacted by Dr Theodosia Blender office [facility]) to set this up.  Follow up with a nurse visit and lab work next week  Your physician recommends that you schedule a follow-up appointment in: 4 weeks

## 2019-06-28 NOTE — Progress Notes (Signed)
Height: 5'5"    Weight: 233 lb BMI: 39.46  Today's Date: 06/28/2019 STOP BANG RISK ASSESSMENT S (snore) Have you been told that you snore?     YES   T (tired) Are you often tired, fatigued, or sleepy during the day?   YES  O (obstruction) Do you stop breathing, choke, or gasp during sleep? YES   P (pressure) Do you have or are you being treated for high blood pressure? YES   B (BMI) Is your body index greater than 35 kg/m? YES   A (age) Are you 51 years old or older? YES   N (neck) Do you have a neck circumference greater than 16 inches?      G (gender) Are you a female? NO   TOTAL STOP/BANG "YES" ANSWERS 6                                                                       For Office Use Only              Procedure Order Form    YES to 3+ Stop Bang questions OR two clinical symptoms - patient qualifies for WatchPAT (CPT 95800)      Clinical Notes: Will consult Sleep Specialist and refer for management of therapy due to patient increased risk of Sleep Apnea. Ordering a sleep study due to the following two clinical symptoms: Excessive daytime sleepiness G47.10 Loud snoring R06.83 /  History of high blood pressure R03.0   Which test do you need, WP1 or WP300???  . Do you have access to a smart device containing the app stores?  If YES, then -->WP1--pt has iphone with BT technology   If NO, then  --->WP300

## 2019-06-28 NOTE — Telephone Encounter (Signed)
This RN reviewed chart and found scanned request from [facility] for medical records showing received as 06/20/2019 , but could not locate any entry showing records were sent.  This RN called pt to discuss- she stated " they ( short term disability ) just called me today and when they said if they didn't get my records as they requested then my benefits would be terminated ".  This RN informed records will be sent by this RN today.  This RN faxed records from 02/15/2019 to present as well as appointments from 02/15/2019 to 10/18/2019 with cover page as provided by [facility].

## 2019-06-28 NOTE — Addendum Note (Signed)
Encounter addended by: Scarlette Calico, RN on: 06/28/2019 1:45 PM  Actions taken: Flowsheet accepted, Clinical Note Signed

## 2019-06-28 NOTE — Progress Notes (Signed)
ReDS Vest - 06/28/19 1300      ReDS Vest   Height Marker  --   Station A   Ruler Value  30    ReDS Value  43

## 2019-06-28 NOTE — Addendum Note (Signed)
Encounter addended by: Scarlette Calico, RN on: 06/28/2019 1:48 PM  Actions taken: Clinical Note Signed

## 2019-06-28 NOTE — Progress Notes (Signed)
Cardio-oncology Clinic Note    Date:  06/28/2019   ID:  Shayne, Deerman 1966-05-22, MRN [id number]  Location: Home  Provider location: [facility] Type of Visit: Established patient  PCP:  [name], NP  Cardiologist:  No primary care provider on file. Primary HF: Julien Berryman  Chief Complaint: Heart Failure follow-up   History of Present Illness:  Beverlee is a  53 y.o. female with h/o DM, Graves disease, thyroid CA, HIV, HTN and right breast cancer referred by Dr. Doris Cheadle for enrollment into the Cardio-Oncology program for EF monitoring during chemotherapy  Right breast CA -  upper inner quadrant biopsy 04/26/2018 for a clinical T2 N0, stage Ib invasive ductal carcinoma, grade 2, with an MIB-1 of 70%.   (1) neoadjuvant chemotherapy with carboplatin, docetaxel, trastuzumab and Pertuzumab given every 21 days x 6, starting 05/31/2018 (a) Pertuzumab discontinued starting with cycle 2 because of diarrhea problems  (3) trastuzumab to continue to total 6 months (a) echo on 05/16/2018 shows an EF of 60-65%. (b) echo on 08/27/18 shows an EF 55-60%             (c) echo on 12/03/18 EF 60-65% GLS -18.8  (d) echo on 03/06/19 EF 55% (read as 40-45%) GLS 15.7% - underestimated  (4) s/p lumpectomy 12/19  (5) adjuvant radiation completed 5/20  (6) antiestrogens to start at the completion of local treatment  She presents today for a work-in visit due to increased SOB. Denies any known heart disease. 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 + HTN - usually not well controlled, DM2 and CKD 3 (creatinine 1.4-1.9)  Checked her BP recently and was in the 180s. SBP at [facility] usually 140-150s  Starting since April and May started getting SOB with chemo and XRT. Gets SOB when she walks or cleans. Nothing at rest. No CP or heaviness. No palpitations. Has gained about 5-7 pounds. Daughter  says she snores heavily. No smoking. Dry cough. No wheeze. She feels it is her allergies. No edema.    CXR 05/24/19: No acute disease   Echo 06/20/19 EF 55-60% Grade 1 DD GLS not measured. Personally reviewed  CT scan 12/19 with LAD calcification     Echo on 03/06/19 EF 55-60% (read as 40-45%) GLS 15.7% - underestimated   Past Medical History:  Diagnosis Date   Allergy    Anemia    Normocytic   Anxiety    Asthma    Blood dyscrasia    Bronchitis 2005   CLASS 1-EXOPHTHALMOS-THYROTOXIC 02/08/2007   Diabetes mellitus without complication ([place])    Family history of breast cancer    Family history of lung cancer    Family history of prostate cancer    Gastroenteritis 07/10/07   GERD 07/24/2006   GRAVE'S DISEASE 01/01/2008   History of hidradenitis suppurativa    History of kidney stones    History of thrush    HIV DISEASE 07/24/2006   dx March 05   Hyperlipidemia    HYPERTENSION 07/24/2006   Hyperthyroidism 08/2006   Grave's Disease -diffuse radiotracer uptake 08/25/06 Thyroid scan-Cold nodule to R lower lobe of thyrorid   Menometrorrhagia    hx of   Nephrolithiasis    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 Completed 2 yrs Prednisone after lung bx confirmation. No symptoms since then.   Suppurative hidradenitis    Thyroid cancer ([place])    THYROID NODULE, RIGHT 02/08/2007   Past Surgical History:  Procedure Laterality Date   BREAST EXCISIONAL BIOPSY Right 04/26/2018   right axilla negative   BREAST EXCISIONAL BIOPSY Left 04/26/2018   left axilla negative   BREAST LUMPECTOMY Right 10/03/2018   malignant   BREAST LUMPECTOMY WITH RADIOACTIVE SEED AND SENTINEL LYMPH  NODE BIOPSY Right 10/03/2018   Procedure: RIGHT BREAST LUMPECTOMY WITH RADIOACTIVE SEED AND SENTINEL LYMPH NODE MAPPING;  Surgeon: Erroll Luna, MD;  Location: [facility];  Service: General;  Laterality: Right;   BREAST SURGERY  1997   Breast Reduction    CYSTOSCOPY W/ URETERAL STENT REMOVAL  11/09/2012   Procedure: CYSTOSCOPY WITH STENT REMOVAL;  Surgeon: Alexis Frock, MD;  Location: [facility];  Service: Urology;  Laterality: Right;   CYSTOSCOPY WITH RETROGRADE PYELOGRAM, URETEROSCOPY AND STENT PLACEMENT  11/09/2012   Procedure: CYSTOSCOPY WITH RETROGRADE PYELOGRAM, URETEROSCOPY AND STENT PLACEMENT;  Surgeon: Alexis Frock, MD;  Location: [facility];  Service: Urology;  Laterality: Left;  LEFT URETEROSCOPY, STONE MANIPULATION, left STENT exchange    CYSTOSCOPY WITH STENT PLACEMENT  10/02/2012   Procedure: CYSTOSCOPY WITH STENT PLACEMENT;  Surgeon: Alexis Frock, MD;  Location: [facility];  Service: Urology;  Laterality: Left;   DILATION AND CURETTAGE OF UTERUS  Feb 2004   s/p for 1st trimester nonviable pregnancy   EYE SURGERY     sty under eyelid   INCISE AND DRAIN ABCESS  Nov 03   s/p I &D for righ inframmary fold hidradenitis   INCISION AND DRAINAGE PERITONSILLAR ABSCESS  Mar 03   IR CV LINE INJECTION  06/07/2018   IR IMAGING GUIDED PORT INSERTION  06/20/2018   IR REMOVAL TUN ACCESS W/ PORT W/O FL MOD SED  06/20/2018   IRRIGATION AND DEBRIDEMENT ABSCESS  01/31/2012   Procedure: IRRIGATION AND DEBRIDEMENT ABSCESS;  Surgeon: Shann Medal, MD;  [facility];  Service: General;  Laterality: Right;  right breast and axilla    NEPHROLITHOTOMY  10/02/2012   Procedure: NEPHROLITHOTOMY PERCUTANEOUS;  Surgeon: Alexis Frock, MD;  [facility];  Service: Urology;  Laterality: Right;  First Stage Percutaneous Nephrolithotomy with Surgeon Access, Left Ureteral Stent     NEPHROLITHOTOMY  10/04/2012   Procedure: NEPHROLITHOTOMY PERCUTANEOUS SECOND LOOK;  Surgeon: Alexis Frock, MD;   [facility];  Service: Urology;  Laterality: Right;      NEPHROLITHOTOMY  10/08/2012   Procedure: NEPHROLITHOTOMY PERCUTANEOUS;  Surgeon: Alexis Frock, MD;  [facility];  Service: Urology;  Laterality: Right;  THIRD STAGE, nephrostomy tube exchange x 2   NEPHROLITHOTOMY  10/11/2012   Procedure: NEPHROLITHOTOMY PERCUTANEOUS SECOND LOOK;  Surgeon: Alexis Frock, MD;  [facility];  Service: Urology;  Laterality: Right;  RIGHT 4 STAGE PERCUTANOUS NEPHROLITHOTOMY, right URETEROSCOPY WITH HOLMIUM LASER    PORTACATH PLACEMENT Left 05/17/2018   Procedure: INSERTION PORT-A-CATH;  Surgeon: Coralie Keens, MD;  Location: [facility];  Service: General;  Laterality: Left;   RADICAL NECK DISSECTION  04/12/2017   limited/notes 04/12/2017   RADICAL NECK DISSECTION N/A 04/12/2017   Procedure: RADICAL NECK DISSECTION;  Surgeon: Melida Quitter, MD;  Location: [facility];  Service: ENT;  Laterality: N/A;  limited neck dissection 2 hours total   REDUCTION MAMMAPLASTY  Bilateral [place]  04/12/2017   completion/notes 04/12/2017   THYROIDECTOMY N/A 04/12/2017   Procedure: THYROIDECTOMY;  Surgeon: Melida Quitter, MD;  Location: [facility];  Service: ENT;  Laterality: N/A;  Completion Thyroidectomy   TOTAL THYROIDECTOMY  2010     Current Outpatient Medications  Medication Sig Dispense Refill   amLODipine (NORVASC) 10 MG tablet TAKE 1 TABLET BY MOUTH EVERY DAY FOR BLOOD PRESSURE 90 tablet 1   cephALEXin (KEFLEX) 500 MG capsule Take 1 capsule (500 mg total) by mouth 3 (three) times daily. 21 capsule 0   ciprofloxacin (CIPRO) 500 MG tablet Take 1 tablet (500 mg total) by mouth 2 (two) times daily. 10 tablet 0   DESCOVY 200-25 MG tablet TAKE 1 TABLET BY MOUTH EVERY DAY 30 tablet 1   doxycycline (VIBRA-TABS) 100 MG tablet 100 mg daily.      fluconazole (DIFLUCAN) 100 MG tablet 2 tablets for first dose then 1 tab a day x 2 days 4 tablet 0   fluticasone  (FLOVENT HFA) 110 MCG/ACT inhaler Inhale 1 puff into the lungs 2 (two) times daily. 1 Inhaler 2   glipiZIDE (GLUCOTROL) 5 MG tablet Take 1 tablet (5 mg total) by mouth 2 (two) times daily before a meal. For diabetes. 180 tablet 0   irbesartan-hydrochlorothiazide (AVALIDE) 150-12.5 MG tablet TAKE 1 TABLET BY MOUTH ONCE DAILY FOR BLOOD PRESSURE. 90 tablet 1   levothyroxine (SYNTHROID) 175 MCG tablet TAKE 1 TABLET BY MOUTH EVERY MORNING ON AN EMPTY STOMACH WITH A FULL GLASS OF WATER. 90 tablet 0   metoprolol succinate (TOPROL-XL) 50 MG 24 hr tablet TAKE 1 TABLET (50 MG TOTAL) BY MOUTH DAILY. TAKE WITH OR IMMEDIATELY FOLLOWING A MEAL. 90 tablet 0   Multiple Vitamin (MULTIVITAMIN) tablet Take 1 tablet by mouth daily.     ONE TOUCH LANCETS MISC Use as instructed to test blood sugar daily 200 each 5   ONETOUCH VERIO test strip USE AS INSTRUCTED TO TEST BLOOD SUGAR DAILY NEED APPOINTMENT FOR ANY MORE REFILLS 50 each 0   potassium chloride (K-DUR) 10 MEQ tablet TAKE 2 TABLETS (20 MEQ TOTAL) BY MOUTH 2 (TWO) TIMES DAILY. 60 tablet 1   rosuvastatin (CRESTOR) 10 MG tablet Take 1 tablet (10 mg total) by mouth daily. For cholesterol. 90 tablet 0   tamoxifen (NOLVADEX) 20 MG tablet Take 1 tablet (20 mg total) by mouth daily. 30 tablet 5   [name] 50 MG tablet TAKE 1 TABLET BY MOUTH EVERY DAY 30 tablet 1   traMADol (ULTRAM) 50 MG tablet Take 1 tablet (50 mg total) by mouth every 6 (six) hours as needed. 30 tablet 0   No current facility-administered medications for this encounter.    Facility-Administered Medications Ordered in Other Encounters  Medication Dose Route Frequency Provider Last Rate Last Dose   sodium chloride flush (NS) 0.9 % injection 10 mL  10 mL Intracatheter PRN Magrinat, Virgie Dad, MD   10 mL at 09/19/18 1551    Allergies:   Genvoya [elviteg-cobic-emtricit-tenofaf], Lisinopril, and Valsartan   Social History:  The patient  reports that she has quit smoking. Her smoking use  included cigarettes. She started smoking about 2 years ago. She has a 7.50 pack-year smoking history. She has never used smokeless tobacco. She reports current alcohol use. She reports that she does not use drugs.   Family History:  The patient's family history includes Breast cancer in her cousin, cousin, cousin, maternal aunt, paternal aunt, and  paternal aunt; Breast cancer (age of onset: 69) in her cousin; Breast cancer (age of onset: 79) in her paternal aunt; Breast cancer (age of onset: 63) in her maternal aunt; Cancer in her maternal uncle and mother; Diabetes in an other family member; Heart disease in her mother and another family member; Hypertension in her brother, father, mother, sister, sister, and another family member; Kidney disease in an other family member; Lung cancer in her paternal uncle; Lung cancer (age of onset: 71) in her father; Prostate cancer in her paternal uncle and paternal uncle; Stroke in an other family member.   ROS:  Please see the history of present illness.   All other systems are personally reviewed and negative.   Vitals:   06/28/19 1016  BP: (!) 152/80  Pulse: 87  SpO2: 95%    Exam:  General:  Well appearing. Mildly SOB after walking in clinic  HEENT: normal Neck: supple. No obvious JVD. Carotids 2+ bilat; no bruits. No lymphadenopathy or thryomegaly appreciated. Cor: PMI nondisplaced. Regular rate & rhythm. No rubs, gallops or murmurs. Lungs: clear Abdomen: obese soft, nontender, nondistended. No hepatosplenomegaly. No bruits or masses. Good bowel sounds. Extremities: no cyanosis, clubbing, rash, edema Neuro: alert & orientedx3, cranial nerves grossly intact. moves all 4 extremities w/o difficulty. Affect pleasant  Recent Labs: 06/05/2019: TSH 34.55 06/14/2019: ALT 30; BUN 19; Creatinine, Ser 1.34; Hemoglobin 12.5; Platelets 193; Potassium 3.4; Sodium 141  Personally reviewed   Wt Readings from Last 3 Encounters:  06/14/19 107.5 kg (237 lb 1.6 oz)    06/05/19 106.6 kg (235 lb)  05/24/19 106.5 kg (234 lb 11.2 oz)      ASSESSMENT AND PLAN:  1. Dyspnea - echo reviewed personally and EF normal  - ReDS 43% suggesting elevated lung water -> likely early diastolic HF in the setting of CKD and poorly-controlled HTN - add spiro and Farxiga which both should promote diuresis.  - check CXR  - f/u next week   2. R Breast Cancer - I reviewed echos personally. EF and Doppler parameters stable. - can resume Herceptin and dyspnea likely not related to chemo effects - D/w Lisabeth Register bu phone  3. HTN - poorly controlled.  - Start spiro 25 - Check BMET 1 week   4. CKD3 - would benefit from addition of SGLT2i -> Start Farxiga 10   5. Snoring - likely has OSA - check home sleep study  6. DM2 - last HGBa1c 7.1 - would benefit from addition of SGLT2i -> Start Farxiag 10   7. Obesity - encouraged weight loss    8. Coronary calcifications on CT - doubt obstructive - continue statin. Goal LDL < 70  Total time spent 45 minutes. Over half that time spent discussing above.    Signed, Glori Bickers, MD  06/28/2019 8:26 AM  Advanced [facility] Failure [place] [address] [phone] (office) [phone] (fax)

## 2019-06-28 NOTE — Progress Notes (Signed)
[place] order, demographic info, stop bang and OV note from today faxed to [employer] at [phone]

## 2019-06-28 NOTE — Addendum Note (Signed)
Encounter addended by: Scarlette Calico, RN on: 06/28/2019 1:54 PM  Actions taken: Order list changed, Diagnosis association updated

## 2019-07-01 ENCOUNTER — Telehealth (HOSPITAL_COMMUNITY): Payer: Self-pay

## 2019-07-01 ENCOUNTER — Telehealth: Payer: Self-pay | Admitting: Oncology

## 2019-07-01 NOTE — Telephone Encounter (Signed)
Called patient regarding pre-screening questions for appointments on 09/15. °

## 2019-07-01 NOTE — Telephone Encounter (Signed)
Received fax from [employer] requesting office to send pt contact information.  Demographics sheet faxed via [employer]

## 2019-07-02 ENCOUNTER — Inpatient Hospital Stay (HOSPITAL_BASED_OUTPATIENT_CLINIC_OR_DEPARTMENT_OTHER): Payer: 59 | Admitting: Oncology

## 2019-07-02 ENCOUNTER — Inpatient Hospital Stay: Payer: 59

## 2019-07-02 ENCOUNTER — Other Ambulatory Visit: Payer: Self-pay

## 2019-07-02 ENCOUNTER — Other Ambulatory Visit: Payer: Self-pay | Admitting: Oncology

## 2019-07-02 ENCOUNTER — Inpatient Hospital Stay: Payer: 59 | Attending: Oncology

## 2019-07-02 VITALS — BP 119/62 | HR 74 | Temp 98.2°F | Resp 16

## 2019-07-02 DIAGNOSIS — Z5112 Encounter for antineoplastic immunotherapy: Secondary | ICD-10-CM | POA: Diagnosis not present

## 2019-07-02 DIAGNOSIS — C73 Malignant neoplasm of thyroid gland: Secondary | ICD-10-CM

## 2019-07-02 DIAGNOSIS — Z170 Estrogen receptor positive status [ER+]: Secondary | ICD-10-CM

## 2019-07-02 DIAGNOSIS — C50311 Malignant neoplasm of lower-inner quadrant of right female breast: Secondary | ICD-10-CM | POA: Diagnosis not present

## 2019-07-02 DIAGNOSIS — C50211 Malignant neoplasm of upper-inner quadrant of right female breast: Secondary | ICD-10-CM

## 2019-07-02 DIAGNOSIS — Z95828 Presence of other vascular implants and grafts: Secondary | ICD-10-CM

## 2019-07-02 LAB — CBC WITH DIFFERENTIAL/PLATELET
Abs Immature Granulocytes: 0.1 10*3/uL — ABNORMAL HIGH (ref 0.00–0.07)
Basophils Absolute: 0 10*3/uL (ref 0.0–0.1)
Basophils Relative: 0 %
Eosinophils Absolute: 0.1 10*3/uL (ref 0.0–0.5)
Eosinophils Relative: 2 %
HCT: 37.1 % (ref 36.0–46.0)
Hemoglobin: 12.4 g/dL (ref 12.0–15.0)
Immature Granulocytes: 2 %
Lymphocytes Relative: 30 %
Lymphs Abs: 2 10*3/uL (ref 0.7–4.0)
MCH: 30.7 pg (ref 26.0–34.0)
MCHC: 33.4 g/dL (ref 30.0–36.0)
MCV: 91.8 fL (ref 80.0–100.0)
Monocytes Absolute: 0.6 10*3/uL (ref 0.1–1.0)
Monocytes Relative: 9 %
Neutro Abs: 3.9 10*3/uL (ref 1.7–7.7)
Neutrophils Relative %: 57 %
Platelets: 230 10*3/uL (ref 150–400)
RBC: 4.04 MIL/uL (ref 3.87–5.11)
RDW: 17.2 % — ABNORMAL HIGH (ref 11.5–15.5)
WBC: 6.8 10*3/uL (ref 4.0–10.5)
nRBC: 0 % (ref 0.0–0.2)

## 2019-07-02 LAB — COMPREHENSIVE METABOLIC PANEL
ALT: 17 U/L (ref 0–44)
AST: 23 U/L (ref 15–41)
Albumin: 3 g/dL — ABNORMAL LOW (ref 3.5–5.0)
Alkaline Phosphatase: 80 U/L (ref 38–126)
Anion gap: 10 (ref 5–15)
BUN: 27 mg/dL — ABNORMAL HIGH (ref 6–20)
CO2: 27 mmol/L (ref 22–32)
Calcium: 8.8 mg/dL — ABNORMAL LOW (ref 8.9–10.3)
Chloride: 105 mmol/L (ref 98–111)
Creatinine, Ser: 1.59 mg/dL — ABNORMAL HIGH (ref 0.44–1.00)
GFR calc Af Amer: 43 mL/min — ABNORMAL LOW (ref >60)
GFR calc non Af Amer: 37 mL/min — ABNORMAL LOW (ref >60)
Glucose, Bld: 185 mg/dL — ABNORMAL HIGH (ref 70–99)
Potassium: 3.3 mmol/L — ABNORMAL LOW (ref 3.5–5.1)
Sodium: 142 mmol/L (ref 135–145)
Total Bilirubin: <0.2 mg/dL — ABNORMAL LOW (ref 0.3–1.2)
Total Protein: 7.6 g/dL (ref 6.5–8.1)

## 2019-07-02 MED ORDER — HEPARIN SOD (PORK) LOCK FLUSH 100 UNIT/ML IV SOLN
500.0000 [IU] | Freq: Once | INTRAVENOUS | Status: AC | PRN
  Administered 2019-07-02: 500 [IU]
  Filled 2019-07-02: qty 5

## 2019-07-02 MED ORDER — SODIUM CHLORIDE 0.9% FLUSH
10.0000 mL | INTRAVENOUS | Status: DC | PRN
  Administered 2019-07-02: 10 mL
  Filled 2019-07-02: qty 10

## 2019-07-02 MED ORDER — DIPHENHYDRAMINE HCL 25 MG PO CAPS
ORAL_CAPSULE | ORAL | Status: AC
  Filled 2019-07-02: qty 2

## 2019-07-02 MED ORDER — SODIUM CHLORIDE 0.9 % IV SOLN
360.0000 mg | Freq: Once | INTRAVENOUS | Status: AC
  Administered 2019-07-02: 360 mg via INTRAVENOUS
  Filled 2019-07-02: qty 10

## 2019-07-02 MED ORDER — SODIUM CHLORIDE 0.9% FLUSH
10.0000 mL | INTRAVENOUS | Status: DC | PRN
  Administered 2019-07-02: 08:00:00 10 mL
  Filled 2019-07-02: qty 10

## 2019-07-02 MED ORDER — SODIUM CHLORIDE 0.9 % IV SOLN
Freq: Once | INTRAVENOUS | Status: AC
  Administered 2019-07-02: 09:00:00 via INTRAVENOUS
  Filled 2019-07-02: qty 250

## 2019-07-02 MED ORDER — ACETAMINOPHEN 325 MG PO TABS
ORAL_TABLET | ORAL | Status: AC
  Filled 2019-07-02: qty 2

## 2019-07-02 MED ORDER — ACETAMINOPHEN 325 MG PO TABS
650.0000 mg | ORAL_TABLET | Freq: Once | ORAL | Status: AC
  Administered 2019-07-02: 650 mg via ORAL

## 2019-07-02 MED ORDER — DIPHENHYDRAMINE HCL 25 MG PO CAPS
50.0000 mg | ORAL_CAPSULE | Freq: Once | ORAL | Status: AC
  Administered 2019-07-02: 50 mg via ORAL

## 2019-07-02 NOTE — Progress Notes (Signed)
[facility]  [phone] [phone]) [phone]    ID: [name] DOB: 1965/11/29  MR#: [id number]  GNO#:[id number]  Patient Care Team: [name], NP as PCP - General (Internal Medicine) Michel Bickers, MD as PCP - Infectious Diseases (Infectious Diseases) Magrinat, Virgie Dad, MD as Consulting Physician (Oncology) Kyung Rudd, MD as Consulting Physician (Radiation Oncology) OTHER MD:   CHIEF COMPLAINT: Triple positive breast cancer  CURRENT TREATMENT: Adjuvant T-DM1; tamoxifen   INTERVAL HISTORY: [name] returns today for follow-up and treatment of her triple positive breast cancer.  She completed her adjuvant radiation in May.  She continues on adjuvant TDM1.  She tolerates this well, with no obvious side effects.  She had an echocardiogram the first week in September which showed a well preserved ejection fraction in the 55-60%.  She also continues on tamoxifen.  She tolerates this generally well, with the usual complaints of hot flashes and mild vaginal discharge.  She was not scheduled for a visit today but she complained of severe pain in the right nipple area.  She was evaluated in the treatment area.  REVIEW OF SYSTEMS: Tasmin tells me the discomfort in the right nipple has been present for 2 or 3 days.  Last night it kept her up at night.  She describes it as a 7 out of 10.  There has been no discharge.  She denies redness or swelling.  She denies any worsening of the hidradenitis.  She had a urinary infection last week which was treated successfully with Cipro and those symptoms have resolved.  She has significant shortness of breath with activity.  She denies cough, phlegm production, pleurisy, or fever.  A detailed review of systems today was otherwise stable  HISTORY OF CURRENT ILLNESS: From the original intake note:  [name] (pronounced "Huff") palpated a mass in the right breast for about 2 weeks before bringing it  to medical attention. She underwent bilateral diagnostic mammography with tomography and right breast ultrasonography at [facility] on 04/20/2018 showing: breast density category B. There was a highly suspicious mass located at 3 o'clock in the upper inner quadrant measuring 2.9 x 2.5 x 1.6 cm and 4 cm from the nipple. Ultrasound of the right axilla showed 5 lymph nodes with abnormal cortical thickening. The left axilla showed 3 lymph nodes with abnormal cortical thickening.  Accordingly on 04/26/2018 she proceeded to biopsy of the right breast area in question. The pathology from this procedure showed ([id number]): Invasive ductal carcinoma, grade II with calcifications. One right axillary lymph node and one left axillary lymph node were negative for carcinoma. Prognostic indicators significant for: estrogen receptor, 100% positive and progesterone receptor, 80% positive, both with strong staining intensity. Proliferation marker Ki67 at 70%. HER2 amplified with ratios HER2/CEP17 signals 4.52 and average HER2 copies per cell 10.85  The patient's subsequent history is as detailed below.  PAST MEDICAL HISTORY: Past Medical History:  Diagnosis Date  . Allergy   . Anemia    Normocytic  . Anxiety   . Asthma   . Blood dyscrasia   . Bronchitis 2005  . 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 Diabetes mellitus without complication ([place])   . Family history of breast cancer   . Family history of lung cancer   . Family history of prostate cancer   . Gastroenteritis 07/10/07  . GERD 07/24/2006  . GRAVE'S DISEASE 01/01/2008  . History of hidradenitis suppurativa   . History of kidney stones   .  History of thrush   . HIV DISEASE 07/24/2006   dx March 05  . Hyperlipidemia   . HYPERTENSION 07/24/2006  . Hyperthyroidism 08/2006   Grave's Disease -diffuse radiotracer uptake 08/25/06 Thyroid scan-Cold nodule to R lower lobe of thyrorid  . Menometrorrhagia    hx of  . Nephrolithiasis   .  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 Personal history of chemotherapy    2020  . Personal history of radiation therapy    2020  . Pneumonia 2005  . 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 Sarcoidosis 02/08/2007   dx as a teenager in [place] from abnl CXR. Completed 2 yrs Prednisone after lung bx confirmation. No symptoms since then.  . Suppurative hidradenitis   . Thyroid cancer ([place])   . THYROID NODULE, RIGHT 02/08/2007  -2018 thyroid nodules were precancerous but pathology was negative for thyroid cancer.   PAST SURGICAL HISTORY: Past Surgical History:  Procedure Laterality Date  . BREAST EXCISIONAL BIOPSY Right 04/26/2018   right axilla negative  . BREAST EXCISIONAL BIOPSY Left 04/26/2018   left axilla negative  . BREAST LUMPECTOMY Right 10/03/2018   malignant  . BREAST LUMPECTOMY WITH RADIOACTIVE SEED AND SENTINEL LYMPH NODE BIOPSY Right 10/03/2018   Procedure: RIGHT BREAST LUMPECTOMY WITH RADIOACTIVE SEED AND SENTINEL LYMPH NODE MAPPING;  Surgeon: Erroll Luna, MD;  Location: [facility];  Service: General;  Laterality: Right;  . BREAST SURGERY  1997   Breast Reduction   . CYSTOSCOPY W/ URETERAL STENT REMOVAL  11/09/2012   Procedure: CYSTOSCOPY WITH STENT REMOVAL;  Surgeon: Alexis Frock, MD;  Location: [facility];  Service: Urology;  Laterality: Right;  . CYSTOSCOPY WITH RETROGRADE PYELOGRAM, URETEROSCOPY AND STENT PLACEMENT  11/09/2012   Procedure: CYSTOSCOPY WITH RETROGRADE PYELOGRAM, URETEROSCOPY AND STENT PLACEMENT;  Surgeon: Alexis Frock, MD;  Location: [facility];  Service: Urology;  Laterality: Left;  LEFT URETEROSCOPY, STONE MANIPULATION, left STENT exchange   . CYSTOSCOPY WITH STENT PLACEMENT  10/02/2012   Procedure: CYSTOSCOPY WITH STENT PLACEMENT;  Surgeon: Alexis Frock, MD;  Location: [facility];  Service: Urology;  Laterality: Left;  . DILATION AND CURETTAGE OF UTERUS  Feb 2004   s/p for 1st trimester  nonviable pregnancy  . EYE SURGERY     sty under eyelid  . INCISE AND DRAIN ABCESS  Nov 03   s/p I &D for righ inframmary fold hidradenitis  . INCISION AND DRAINAGE PERITONSILLAR ABSCESS  Mar 03  . IR CV LINE INJECTION  06/07/2018  . IR IMAGING GUIDED PORT INSERTION  06/20/2018  . IR REMOVAL TUN ACCESS W/ PORT W/O FL MOD SED  06/20/2018  . IRRIGATION AND DEBRIDEMENT ABSCESS  01/31/2012   Procedure: IRRIGATION AND DEBRIDEMENT ABSCESS;  Surgeon: Shann Medal, MD;  Location: [facility];  Service: General;  Laterality: Right;  right breast and axilla   . NEPHROLITHOTOMY  10/02/2012   Procedure: NEPHROLITHOTOMY PERCUTANEOUS;  Surgeon: Alexis Frock, MD;  Location[facility];  Service: Urology;  Laterality: Right;  First Stage Percutaneous Nephrolithotomy with Surgeon Access, Left Ureteral Stent    . NEPHROLITHOTOMY  10/04/2012   Procedure: NEPHROLITHOTOMY PERCUTANEOUS SECOND LOOK;  Surgeon: Alexis Frock, MD;  [facility];  Service: Urology;  Laterality: Right;     . NEPHROLITHOTOMY  10/08/2012   Procedure: NEPHROLITHOTOMY PERCUTANEOUS;  Surgeon: Alexis Frock, MD;  [facility];  Service: Urology;  Laterality: Right;  THIRD STAGE, nephrostomy tube exchange x 2  . NEPHROLITHOTOMY  10/11/2012  Procedure: NEPHROLITHOTOMY PERCUTANEOUS SECOND LOOK;  Surgeon: Alexis Frock, MD;  [facility];  Service: Urology;  Laterality: Right;  RIGHT 4 STAGE PERCUTANOUS NEPHROLITHOTOMY, right URETEROSCOPY WITH HOLMIUM LASER   . PORTACATH PLACEMENT Left 05/17/2018   Procedure: INSERTION PORT-A-CATH;  Surgeon: Coralie Keens, MD;  Location: [facility];  Service: General;  Laterality: Left;  . RADICAL NECK DISSECTION  04/12/2017   limited/notes 04/12/2017  . RADICAL NECK DISSECTION N/A 04/12/2017   Procedure: RADICAL NECK DISSECTION;  Surgeon: Melida Quitter, MD;  Location: [facility];  Service: ENT;  Laterality: N/A;  limited neck dissection 2 hours total  . REDUCTION MAMMAPLASTY Bilateral 1998  .  [place]  1994  . THYROIDECTOMY  04/12/2017   completion/notes 04/12/2017  . THYROIDECTOMY N/A 04/12/2017   Procedure: THYROIDECTOMY;  Surgeon: Melida Quitter, MD;  Location: [facility];  Service: ENT;  Laterality: N/A;  Completion Thyroidectomy  . TOTAL THYROIDECTOMY  2010  Thyroid nodules removed- pathology at the ENT doctor 2018 were benign -Over active thyroid and thyroidectomy with benign pathology  FAMILY HISTORY Family History  Problem Relation Age of Onset  . Hypertension Mother   . Cancer Mother        laryngeal  . Heart disease Mother        stent  . Hypertension Father   . Lung cancer Father 68       hx smoking  . Heart disease Other   . Hypertension Other   . Stroke Other        Grandparent  . Kidney disease Other        Grandparent  . Diabetes Other        FH of Diabetes  . Hypertension Sister   . Cancer Maternal Uncle        Lung CA  . Hypertension Brother   . Hypertension Sister   . Breast cancer Maternal Aunt 65  . Breast cancer Paternal Aunt 34  . Prostate cancer Paternal Uncle   . Breast cancer Maternal Aunt        dx 60+  . Breast cancer Paternal Aunt        dx 63's  . Breast cancer Paternal Aunt        dx 50's  . Prostate cancer Paternal Uncle   . Lung cancer Paternal Uncle   . Breast cancer Cousin 26  . Breast cancer Cousin        dx <50  . Breast cancer Cousin        dx <50  . Breast cancer Cousin        dx <50  . Colon cancer Neg Hx   . Esophageal cancer Neg Hx   . Rectal cancer Neg Hx   . Stomach cancer Neg Hx   The patient's mother is alive at age 52. The patient's father died at 52 from lung cancer (heavy smoker). She does not know much about her father. The patient has 1 brother and 2 sisters. The patient's mother had a history of laryngeal cancer. There was a maternal cousin with breast cancer diagnosed at age 42. There were 2 maternal aunts with breast cancer, the youngest being diagnosed in the 64's. There was a maternal great aunt with breast  cancer. There were 2 paternal aunts with breast cancer.    GYNECOLOGIC HISTORY:  Patient's last menstrual period was 03/31/2014. Menarche: 53 years old Age at first live birth: 53 years old She is GX P1.  Her LMP was in 2017. She  did not use HRT.    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 At home is the patient's daughter, Verdie Drown, age 59, who is a Engineer, technical sales education and working part-time at the [employer]. The patient attends [employer].   ADVANCED DIRECTIVES: Not in place; at the 05/10/2018 visit the patient was given the appropriate documents to complete and notarized at her discretion   HEALTH MAINTENANCE: Social History   Tobacco Use  . Smoking status: Former Smoker    Packs/day: 0.50    Years: 15.00    Pack years: 7.50    Types: Cigarettes    Start date: 04/12/2017  . Smokeless tobacco: Never Used  . Tobacco comment: quit smoking  may 2018  Substance Use Topics  . Alcohol use: Yes    Alcohol/week: 0.0 standard drinks    Comment: social  . Drug use: No     Colonoscopy: no  PAP: [facility]. 2018  Bone density: remote   Allergies  Allergen Reactions  . Genvoya [Elviteg-Cobic-Emtricit-Tenofaf] Hives  . Lisinopril Cough  . Valsartan Other (See Comments) and Cough    Pt states she tolerates medicine now    Current Outpatient Medications  Medication Sig Dispense Refill  . amLODipine (NORVASC) 10 MG tablet TAKE 1 TABLET BY MOUTH EVERY DAY FOR BLOOD PRESSURE 90 tablet 1  . cephALEXin (KEFLEX) 500 MG capsule Take 1 capsule (500 mg total) by mouth 3 (three) times daily. 21 capsule 0  . ciprofloxacin (CIPRO) 500 MG tablet Take 1 tablet (500 mg total) by mouth 2 (two) times daily. 10 tablet 0  . dapagliflozin propanediol (FARXIGA) 10 MG TABS tablet Take 10 mg by mouth daily before breakfast. 30 tablet 6  . doxycycline (VIBRA-TABS) 100 MG tablet 100 mg daily.     Marland Kitchen emtricitabine-tenofovir AF (DESCOVY)  200-25 MG tablet Take 1 tablet by mouth daily. 30 tablet 2  . fluconazole (DIFLUCAN) 100 MG tablet 2 tablets for first dose then 1 tab a day x 2 days 4 tablet 0  . fluticasone (FLOVENT HFA) 110 MCG/ACT inhaler Inhale 1 puff into the lungs 2 (two) times daily. 1 Inhaler 2  . glipiZIDE (GLUCOTROL) 5 MG tablet Take 1 tablet (5 mg total) by mouth 2 (two) times daily before a meal. For diabetes. 180 tablet 0  . irbesartan-hydrochlorothiazide (AVALIDE) 150-12.5 MG tablet TAKE 1 TABLET BY MOUTH ONCE DAILY FOR BLOOD PRESSURE. 90 tablet 1  . levothyroxine (SYNTHROID) 175 MCG tablet TAKE 1 TABLET BY MOUTH EVERY MORNING ON AN EMPTY STOMACH WITH A FULL GLASS OF WATER. 90 tablet 0  . metoprolol succinate (TOPROL-XL) 50 MG 24 hr tablet TAKE 1 TABLET (50 MG TOTAL) BY MOUTH DAILY. TAKE WITH OR IMMEDIATELY FOLLOWING A MEAL. 90 tablet 0  . Multiple Vitamin (MULTIVITAMIN) tablet Take 1 tablet by mouth daily.    . ONE TOUCH LANCETS MISC Use as instructed to test blood sugar daily 200 each 5  . ONETOUCH VERIO test strip USE AS INSTRUCTED TO TEST BLOOD SUGAR DAILY NEED APPOINTMENT FOR ANY MORE REFILLS 50 each 0  . potassium chloride (K-DUR) 10 MEQ tablet TAKE 2 TABLETS (20 MEQ TOTAL) BY MOUTH 2 (TWO) TIMES DAILY. 60 tablet 1  . rosuvastatin (CRESTOR) 10 MG tablet Take 1 tablet (10 mg total) by mouth daily. For cholesterol. 90 tablet 0  . spironolactone (ALDACTONE) 25 MG tablet Take 1 tablet (25 mg total) by mouth daily. 30 tablet 6  . tamoxifen (NOLVADEX) 20 MG  tablet Take 1 tablet (20 mg total) by mouth daily. 30 tablet 5  . [name] 50 MG tablet TAKE 1 TABLET BY MOUTH EVERY DAY 30 tablet 2  . traMADol (ULTRAM) 50 MG tablet Take 1 tablet (50 mg total) by mouth every 6 (six) hours as needed. 30 tablet 0   No current facility-administered medications for this visit.    Facility-Administered Medications Ordered in Other Visits  Medication Dose Route Frequency Provider Last Rate Last Dose  . sodium chloride flush (NS)  0.9 % injection 10 mL  10 mL Intracatheter PRN Magrinat, Virgie Dad, MD   10 mL at 09/19/18 1551    OBJECTIVE: Middle-aged African-American woman examined in recliner  Vitals for this visit can be checked through the infusion area flowsheet There were no vitals filed for this visit.   There is no height or weight on file to calculate BMI.   Wt Readings from Last 3 Encounters:  06/14/19 237 lb 1.6 oz (107.5 kg)  06/05/19 235 lb (106.6 kg)  05/24/19 234 lb 11.2 oz (106.5 kg)  ECOG FS:1 - Symptomatic but completely ambulatory  Examination of the right breast shows no swelling or erythema.  There is minimal induration around the nipple which is likely physiologic.  There is no discharge.  There is no significant tenderness to palpation.  LAB RESULTS:  CMP     Component Value Date/Time   NA 138 06/28/2019 1141   K 3.1 (L) 06/28/2019 1141   CL 99 06/28/2019 1141   CO2 27 06/28/2019 1141   GLUCOSE 260 (H) 06/28/2019 1141   BUN 23 (H) 06/28/2019 1141   CREATININE 1.71 (H) 06/28/2019 1141   CREATININE 1.28 (H) 05/10/2018 1458   CREATININE 1.02 09/23/2015 1047   CALCIUM 9.2 06/28/2019 1141   PROT 7.7 06/14/2019 0925   ALBUMIN 2.9 (L) 06/14/2019 0925   AST 44 (H) 06/14/2019 0925   AST 28 05/10/2018 1458   ALT 30 06/14/2019 0925   ALT 23 05/10/2018 1458   ALKPHOS 84 06/14/2019 0925   BILITOT 0.2 (L) 06/14/2019 0925   BILITOT <0.2 (L) 05/10/2018 1458   GFRNONAA 34 (L) 06/28/2019 1141   GFRNONAA 48 (L) 05/10/2018 1458   GFRAA 39 (L) 06/28/2019 1141   GFRAA 56 (L) 05/10/2018 1458    No results found for: TOTALPROTELP, ALBUMINELP, A1GS, A2GS, BETS, BETA2SER, GAMS, MSPIKE, SPEI  No results found for: KPAFRELGTCHN, LAMBDASER, KAPLAMBRATIO  Lab Results  Component Value Date   WBC 6.8 07/02/2019   NEUTROABS PENDING 07/02/2019   HGB 12.4 07/02/2019   HCT 37.1 07/02/2019   MCV 91.8 07/02/2019   PLT 230 07/02/2019    '@LASTCHEMISTRY'$ @  No results found for: LABCA2  No components  found for: [id number]  No results for input(s): INR in the last 168 hours.  No results found for: LABCA2  No results found for: TDV761  No results found for: YWV371  No results found for: GGY694  No results found for: CA2729  No components found for: HGQUANT  No results found for: CEA1 / No results found for: CEA1   No results found for: AFPTUMOR  No results found for: CHROMOGRNA  No results found for: PSA1  Appointment on 07/02/2019  Component Date Value Ref Range Status  . WBC 07/02/2019 6.8  4.0 - 10.5 K/uL Final  . RBC 07/02/2019 4.04  3.87 - 5.11 MIL/uL Final  . Hemoglobin 07/02/2019 12.4  12.0 - 15.0 g/dL Final  . HCT 07/02/2019 37.1  36.0 - 46.0 %  Final  . MCV 07/02/2019 91.8  80.0 - 100.0 fL Final  . MCH 07/02/2019 30.7  26.0 - 34.0 pg Final  . MCHC 07/02/2019 33.4  30.0 - 36.0 g/dL Final  . RDW 07/02/2019 17.2* 11.5 - 15.5 % Final  . Platelets 07/02/2019 230  150 - 400 K/uL Final  . nRBC 07/02/2019 0.0  0.0 - 0.2 % Final   Performed at [facility], [place] [address], [place], [place] [address]  . Neutrophils Relative % 07/02/2019 PENDING  % Incomplete  . Neutro Abs 07/02/2019 PENDING  1.7 - 7.7 K/uL Incomplete  . Band Neutrophils 07/02/2019 PENDING  % Incomplete  . Lymphocytes Relative 07/02/2019 PENDING  % Incomplete  . Lymphs Abs 07/02/2019 PENDING  0.7 - 4.0 K/uL Incomplete  . Monocytes Relative 07/02/2019 PENDING  % Incomplete  . Monocytes Absolute 07/02/2019 PENDING  0.1 - 1.0 K/uL Incomplete  . Eosinophils Relative 07/02/2019 PENDING  % Incomplete  . Eosinophils Absolute 07/02/2019 PENDING  0.0 - 0.5 K/uL Incomplete  . Basophils Relative 07/02/2019 PENDING  % Incomplete  . Basophils Absolute 07/02/2019 PENDING  0.0 - 0.1 K/uL Incomplete  . WBC Morphology 07/02/2019 PENDING   Incomplete  . RBC Morphology 07/02/2019 PENDING   Incomplete  . Smear Review 07/02/2019 PENDING   Incomplete  . Other 07/02/2019 PENDING  % Incomplete  .  nRBC 07/02/2019 PENDING  0 /100 WBC Incomplete  . Metamyelocytes Relative 07/02/2019 PENDING  % Incomplete  . Myelocytes 07/02/2019 PENDING  % Incomplete  . Promyelocytes Relative 07/02/2019 PENDING  % Incomplete  . Blasts 07/02/2019 PENDING  % Incomplete    (this displays the last labs from the last 3 days)  No results found for: TOTALPROTELP, ALBUMINELP, A1GS, A2GS, BETS, BETA2SER, GAMS, MSPIKE, SPEI (this displays SPEP labs)  No results found for: KPAFRELGTCHN, LAMBDASER, KAPLAMBRATIO (kappa/lambda light chains)  No results found for: HGBA, HGBA2QUANT, HGBFQUANT, HGBSQUAN (Hemoglobinopathy evaluation)   No results found for: LDH  Lab Results  Component Value Date   IRON 26 (L) 02/08/2007   TIBC 371 02/08/2007   IRONPCTSAT 7 (L) 02/08/2007   (Iron and TIBC)  No results found for: FERRITIN  Urinalysis    Component Value Date/Time   COLORURINE YELLOW 06/14/2019 1045   APPEARANCEUR CLOUDY (A) 06/14/2019 1045   LABSPEC 1.013 06/14/2019 1045   PHURINE 6.0 06/14/2019 1045   GLUCOSEU NEGATIVE 06/14/2019 1045   GLUCOSEU NEG mg/dL 01/23/2008 1418   HGBUR LARGE (A) 06/14/2019 1045   BILIRUBINUR NEGATIVE 06/14/2019 1045   BILIRUBINUR negative 12/29/2017 [place] 06/14/2019 1045   PROTEINUR >=300 (A) 06/14/2019 1045   UROBILINOGEN 0.2 12/29/2017 1046   UROBILINOGEN 0.2 10/21/2014 2347   NITRITE NEGATIVE 06/14/2019 1045   LEUKOCYTESUR LARGE (A) 06/14/2019 1045     STUDIES: Ct Abdomen Pelvis Wo Contrast  Result Date: 06/03/2019 CLINICAL DATA:  Right flank pain. Nephrolithiasis. EXAM: CT ABDOMEN AND PELVIS WITHOUT CONTRAST TECHNIQUE: Multidetector CT imaging of the abdomen and pelvis was performed following the standard protocol without IV contrast. COMPARISON:  10/22/2014 FINDINGS: Lower chest: No acute findings. Hepatobiliary: No mass visualized on this unenhanced exam. Mild hepatic steatosis. Gallbladder is unremarkable. No evidence of biliary ductal  dilatation. Pancreas: No mass or inflammatory process visualized on this unenhanced exam. Spleen:  Within normal limits in size. Adrenals/Urinary tract: Several partial staghorn calculi are again seen within the right renal collecting system, without significant change compared to previous study. No evidence ureteral calculi or hydronephrosis. Stomach/Bowel: No evidence  of obstruction, inflammatory process, or abnormal fluid collections. Mild sigmoid diverticulosis noted, without evidence of diverticulitis. Vascular/Lymphatic: Tiny sub-cm abdominal retroperitoneal lymph nodes remain stable, consistent with benign etiology. No pathologically enlarged lymph nodes identified. No evidence of abdominal aortic aneurysm. Reproductive:  No mass or other significant abnormality. Other:  None. Musculoskeletal:  No suspicious bone lesions identified. IMPRESSION: 1. 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 No evidence of ureteral calculi, hydronephrosis, or other acute findings. 2. Colonic diverticulosis, without radiographic evidence of diverticulitis. Electronically Signed   By: Marlaine Hind M.D.   On: 06/03/2019 14:17   Dg Chest 2 View  Result Date: 06/28/2019 CLINICAL DATA:  Dyspnea EXAM: CHEST - 2 VIEW COMPARISON:  05/24/2019, CT chest, 09/27/2018 FINDINGS: The heart size and mediastinal contours are within normal limits. Left chest port catheter. Both lungs are clear. Unchanged, subtle sclerotic focus of the right fifth rib, in keeping with lesion as seen on prior CT. IMPRESSION: No acute abnormality of the lungs. Electronically Signed   By: Eddie Candle M.D.   On: 06/28/2019 16:01   Mm Diag Breast Tomo Bilateral  Result Date: 06/06/2019 CLINICAL DATA:  Right lumpectomy.  Annual mammography. EXAM: DIGITAL DIAGNOSTIC BILATERAL MAMMOGRAM WITH CAD AND TOMO COMPARISON:  Previous exam(s). ACR Breast Density Category b: There are scattered areas of fibroglandular density. FINDINGS: The right  lumpectomy site appears as expected. Enlarged lymph nodes in the axilla, left greater than right, have been previously evaluated and noted to be reactive. No other suspicious findings are seen in either breast. Mammographic images were processed with CAD. IMPRESSION: No mammographic evidence of malignancy in either breast. RECOMMENDATION: Annual diagnostic mammography. I have discussed the findings and recommendations with the patient. Results were also provided in writing at the conclusion of the visit. If applicable, a reminder letter will be sent to the patient regarding the next appointment. BI-RADS CATEGORY  2: Benign. Electronically Signed   By: Dorise Bullion III M.D   On: 06/06/2019 12:04     ELIGIBLE FOR AVAILABLE RESEARCH PROTOCOL: UPBEAT   ASSESSMENT: 53 y.o. [place], [place] woman status post right breast upper inner quadrant biopsy 04/26/2018 for a clinical T2 N0, stage IB invasive ductal carcinoma, grade 2, ER/PR positive, HER-2 positive with an MIB-1 of 70%.  (1) genetics testing 07/03/2018: no pathogenic mutations. Genes Analyzed (43 total): APC*, ATM*, AXIN2, BARD1, BMPR1A, BRCA1*, BRCA2*, BRIP1*, CDH1*, CDK4, CDKN2A, CHEK2*, DICER1,GALNT12, HOXB13, MEN1, MLH1*, MRE11A, MSH2*, MSH3, MSH6*, MUTYH*, NBN, NF1*, NTHL1, PALB2*, PMS2*, POLD1, POLE,PTEN*, RAD50, RAD51C*, RAD51D*, RET, SDHB, SDHD, SMAD4, SMARCA4, STK11 and TP53* (sequencing and deletion/duplication); EGFR (sequencing only); EPCAM and GREM1 (deletion/duplication only). DNA and RNA analyses performed for * genes.  (2) neoadjuvant chemotherapy consisting of carboplatin, docetaxel, trastuzumab and pertuzumab given every 21 days x 6, starting 05/31/2018, completed 09/14/2018  (a) Pertuzumab discontinued starting with cycle 2 because of diarrhea problems  (b) Taxotere stopped after 4 cycles due peripheral neuropathy and substituted for last two cycles with gemcitabine  (3)  definitive surgery on 10/03/2018 showed residual ypT1c ypN0  invasive ductal carcinoma, grade 2, with negative margins; a total of 5 sentinel lymph nodes removed; repeat prognostic panel again triple positive  (4) T-DM1 given adjuvantly due to residual disease: started 11/13/2018.  (a) echo on 05/16/2018 shows an EF of 60-65%.  (b) echo on 08/27/18 shows an EF 55-60%  (c) echo on 12/03/2018 shows an EF of 60-65%  (d) echocardiogram on 03/06/2019 showed an ejection fraction of 55% as read by Dr.  Bensimhon.  (e) echocardiogram 06/20/2019 shows an ejection fraction in the 55-60% range  (5) adjuvant radiation completed 01/07/2019 - 02/21/2019 (delayed due to wound healing issues)  Site/dose:   The patient initially received a dose of 50.4 Gy in 28 fractions to the right breast using whole-breast tangent fields. This was delivered using a 3-D conformal technique. The patient then received a boost to the seroma. This delivered an additional 10 Gy in 5 fractions using 12e electrons with a special teletherapy technique. The total dose was 60.4 Gy.  (6) Tamoxifen started 02/28/2019  (7) hidradenitis/boils: on doxycycline daily   PLAN: Tnia is tolerating T-DM1 well, and her echocardiogram is favorable.  She has no anemia per labs today.  She does not have any symptoms suggestive of pneumonia.  The shortness of breath she is experiencing could be due to deconditioning or could be due to asthma since she tells me her primary care physician prescribed an inhaler and that is helping a little bit.  I asked her to be as active as she possibly can and perhaps that will help.  Exam of the right breast today does not show any significant abnormality and certainly no evidence of infection.  We are proceeding with treatment today  Her dysuria and other urinary symptoms have cleared.  She is keeping appropriate pandemic precautions    She is already scheduled for return visit in 3 weeks.  I will see her at that time.  She knows to call for any other issues that may  develop before then.  Wilber Bihari, NP  07/02/19 8:45 AM Medical Oncology and Hematology [facility] [address] [place], [place] [address] Tel. [phone]    Fax. [phone]

## 2019-07-02 NOTE — Patient Instructions (Signed)
[facility] Discharge Instructions for Patients Receiving Chemotherapy  Today you received the following chemotherapy agents [place]  To help prevent nausea and vomiting after your treatment, we encourage you to take your nausea medication as directed   If you develop nausea and vomiting that is not controlled by your nausea medication, call the clinic.   BELOW ARE SYMPTOMS THAT SHOULD BE REPORTED IMMEDIATELY:  *FEVER GREATER THAN 100.5 F  *CHILLS WITH OR WITHOUT FEVER  NAUSEA AND VOMITING THAT IS NOT CONTROLLED WITH YOUR NAUSEA MEDICATION  *UNUSUAL SHORTNESS OF BREATH  *UNUSUAL BRUISING OR BLEEDING  TENDERNESS IN MOUTH AND THROAT WITH OR WITHOUT PRESENCE OF ULCERS  *URINARY PROBLEMS  *BOWEL PROBLEMS  UNUSUAL RASH Items with * indicate a potential emergency and should be followed up as soon as possible.  Feel free to call the clinic should you have any questions or concerns. The clinic phone number is [phone].  Please show the CHEMO ALERT CARD at check-in to the Emergency Department and triage nurse.   

## 2019-07-02 NOTE — Progress Notes (Signed)
Ok to treat with scr 1.59 per Dr. [name].    Pt refused to wait 30 minutes post observation period.

## 2019-07-03 ENCOUNTER — Other Ambulatory Visit (HOSPITAL_COMMUNITY): Payer: 59

## 2019-07-03 ENCOUNTER — Ambulatory Visit (HOSPITAL_COMMUNITY)
Admission: RE | Admit: 2019-07-03 | Discharge: 2019-07-03 | Disposition: A | Discharge status: Home / Self Care | Payer: 59 | Source: Ambulatory Visit | Attending: Cardiology | Admitting: Cardiology

## 2019-07-03 DIAGNOSIS — I10 Essential (primary) hypertension: Secondary | ICD-10-CM | POA: Diagnosis not present

## 2019-07-03 LAB — BASIC METABOLIC PANEL
Anion gap: 10 (ref 5–15)
BUN: 21 mg/dL — ABNORMAL HIGH (ref 6–20)
CO2: 25 mmol/L (ref 22–32)
Calcium: 8.9 mg/dL (ref 8.9–10.3)
Chloride: 102 mmol/L (ref 98–111)
Creatinine, Ser: 1.42 mg/dL — ABNORMAL HIGH (ref 0.44–1.00)
GFR calc Af Amer: 49 mL/min — ABNORMAL LOW (ref >60)
GFR calc non Af Amer: 43 mL/min — ABNORMAL LOW (ref >60)
Glucose, Bld: 170 mg/dL — ABNORMAL HIGH (ref 70–99)
Potassium: 3.8 mmol/L (ref 3.5–5.1)
Sodium: 137 mmol/L (ref 135–145)

## 2019-07-03 NOTE — Progress Notes (Signed)
Patient presents to office for bp check and REDS clips  clip-35 and b/p elevated Patient still c/o increased SOB  Patient reports to starting 1 of 2 new medications   advised will forward to provider to review once he has lab results and will address further at that time

## 2019-07-03 NOTE — Progress Notes (Signed)
ReDS Vest - 07/03/19 1000      ReDS Vest   Fitting Posture  Sitting    Height Marker  Tall    Ruler Value  41   station B   Center Strip  Aligned    ReDS Value  35

## 2019-07-03 NOTE — Addendum Note (Signed)
Encounter addended by: Kerry Dory, CMA on: 07/03/2019 11:09 AM  Actions taken: Clinical Note Signed

## 2019-07-04 ENCOUNTER — Telehealth: Payer: Self-pay | Admitting: Primary Care

## 2019-07-04 DIAGNOSIS — E890 Postprocedural hypothyroidism: Secondary | ICD-10-CM

## 2019-07-04 NOTE — Telephone Encounter (Signed)
Please notify patient:  As discussed during her last visit we need to recheck her thyroid function.  During her last visit her thyroid function was not in the therapeutic range.  Please schedule.  Be sure to take your levothyroxine (thyroid medication) every morning on an empty stomach with water only. No food or other medications for 30 minutes. No heartburn medication, iron pills, calcium, vitamin D, or magnesium pills within four hours of taking levothyroxine.

## 2019-07-05 NOTE — Telephone Encounter (Signed)
Message left for patient to return my call.  

## 2019-07-08 NOTE — Telephone Encounter (Signed)
Spoken to patient and schedule lab appt on 07/10/2019

## 2019-07-10 ENCOUNTER — Other Ambulatory Visit: Payer: 59

## 2019-07-11 ENCOUNTER — Ambulatory Visit (INDEPENDENT_AMBULATORY_CARE_PROVIDER_SITE_OTHER): Payer: 59 | Admitting: Primary Care

## 2019-07-11 ENCOUNTER — Inpatient Hospital Stay (HOSPITAL_COMMUNITY): Payer: 59

## 2019-07-11 ENCOUNTER — Emergency Department (HOSPITAL_COMMUNITY): Payer: 59

## 2019-07-11 ENCOUNTER — Other Ambulatory Visit: Payer: Self-pay

## 2019-07-11 ENCOUNTER — Encounter: Payer: Self-pay | Admitting: Primary Care

## 2019-07-11 ENCOUNTER — Encounter (HOSPITAL_COMMUNITY): Payer: Self-pay | Admitting: Emergency Medicine

## 2019-07-11 ENCOUNTER — Inpatient Hospital Stay (HOSPITAL_COMMUNITY)
Admission: EM | Admit: 2019-07-11 | Discharge: 2019-07-15 | DRG: 600 | Disposition: A | Discharge status: Home / Self Care | Payer: 59 | Attending: Internal Medicine | Admitting: Internal Medicine

## 2019-07-11 ENCOUNTER — Other Ambulatory Visit: Payer: 59

## 2019-07-11 VITALS — BP 140/86 | HR 82 | Temp 97.6°F | Ht 65.0 in | Wt 235.5 lb

## 2019-07-11 DIAGNOSIS — E119 Type 2 diabetes mellitus without complications: Secondary | ICD-10-CM | POA: Diagnosis not present

## 2019-07-11 DIAGNOSIS — Z79899 Other long term (current) drug therapy: Secondary | ICD-10-CM | POA: Diagnosis not present

## 2019-07-11 DIAGNOSIS — N183 Chronic kidney disease, stage 3 unspecified: Secondary | ICD-10-CM | POA: Diagnosis present

## 2019-07-11 DIAGNOSIS — Z20828 Contact with and (suspected) exposure to other viral communicable diseases: Secondary | ICD-10-CM | POA: Diagnosis present

## 2019-07-11 DIAGNOSIS — Z95828 Presence of other vascular implants and grafts: Secondary | ICD-10-CM | POA: Diagnosis not present

## 2019-07-11 DIAGNOSIS — Z79890 Hormone replacement therapy (postmenopausal): Secondary | ICD-10-CM

## 2019-07-11 DIAGNOSIS — J45909 Unspecified asthma, uncomplicated: Secondary | ICD-10-CM | POA: Diagnosis present

## 2019-07-11 DIAGNOSIS — I130 Hypertensive heart and chronic kidney disease with heart failure and stage 1 through stage 4 chronic kidney disease, or unspecified chronic kidney disease: Secondary | ICD-10-CM | POA: Diagnosis present

## 2019-07-11 DIAGNOSIS — B954 Other streptococcus as the cause of diseases classified elsewhere: Secondary | ICD-10-CM | POA: Diagnosis present

## 2019-07-11 DIAGNOSIS — E785 Hyperlipidemia, unspecified: Secondary | ICD-10-CM | POA: Diagnosis not present

## 2019-07-11 DIAGNOSIS — N61 Mastitis without abscess: Secondary | ICD-10-CM

## 2019-07-11 DIAGNOSIS — Z9221 Personal history of antineoplastic chemotherapy: Secondary | ICD-10-CM | POA: Diagnosis not present

## 2019-07-11 DIAGNOSIS — F419 Anxiety disorder, unspecified: Secondary | ICD-10-CM | POA: Diagnosis not present

## 2019-07-11 DIAGNOSIS — Z170 Estrogen receptor positive status [ER+]: Secondary | ICD-10-CM | POA: Diagnosis not present

## 2019-07-11 DIAGNOSIS — Z8639 Personal history of other endocrine, nutritional and metabolic disease: Secondary | ICD-10-CM | POA: Diagnosis not present

## 2019-07-11 DIAGNOSIS — L732 Hidradenitis suppurativa: Secondary | ICD-10-CM | POA: Diagnosis not present

## 2019-07-11 DIAGNOSIS — E1165 Type 2 diabetes mellitus with hyperglycemia: Secondary | ICD-10-CM | POA: Diagnosis present

## 2019-07-11 DIAGNOSIS — C7989 Secondary malignant neoplasm of other specified sites: Secondary | ICD-10-CM | POA: Diagnosis present

## 2019-07-11 DIAGNOSIS — Z7951 Long term (current) use of inhaled steroids: Secondary | ICD-10-CM

## 2019-07-11 DIAGNOSIS — Z841 Family history of disorders of kidney and ureter: Secondary | ICD-10-CM

## 2019-07-11 DIAGNOSIS — I509 Heart failure, unspecified: Secondary | ICD-10-CM | POA: Diagnosis present

## 2019-07-11 DIAGNOSIS — Z8249 Family history of ischemic heart disease and other diseases of the circulatory system: Secondary | ICD-10-CM

## 2019-07-11 DIAGNOSIS — N186 End stage renal disease: Secondary | ICD-10-CM | POA: Diagnosis present

## 2019-07-11 DIAGNOSIS — D649 Anemia, unspecified: Secondary | ICD-10-CM | POA: Diagnosis present

## 2019-07-11 DIAGNOSIS — E890 Postprocedural hypothyroidism: Secondary | ICD-10-CM | POA: Diagnosis present

## 2019-07-11 DIAGNOSIS — Z792 Long term (current) use of antibiotics: Secondary | ICD-10-CM

## 2019-07-11 DIAGNOSIS — Z888 Allergy status to other drugs, medicaments and biological substances status: Secondary | ICD-10-CM | POA: Diagnosis not present

## 2019-07-11 DIAGNOSIS — N644 Mastodynia: Secondary | ICD-10-CM

## 2019-07-11 DIAGNOSIS — E1122 Type 2 diabetes mellitus with diabetic chronic kidney disease: Secondary | ICD-10-CM | POA: Diagnosis present

## 2019-07-11 DIAGNOSIS — N184 Chronic kidney disease, stage 4 (severe): Secondary | ICD-10-CM | POA: Diagnosis present

## 2019-07-11 DIAGNOSIS — C50311 Malignant neoplasm of lower-inner quadrant of right female breast: Secondary | ICD-10-CM

## 2019-07-11 DIAGNOSIS — Z21 Asymptomatic human immunodeficiency virus [HIV] infection status: Secondary | ICD-10-CM

## 2019-07-11 DIAGNOSIS — Z87442 Personal history of urinary calculi: Secondary | ICD-10-CM

## 2019-07-11 DIAGNOSIS — Z923 Personal history of irradiation: Secondary | ICD-10-CM

## 2019-07-11 DIAGNOSIS — Z7984 Long term (current) use of oral hypoglycemic drugs: Secondary | ICD-10-CM

## 2019-07-11 DIAGNOSIS — E1169 Type 2 diabetes mellitus with other specified complication: Secondary | ICD-10-CM

## 2019-07-11 DIAGNOSIS — Z801 Family history of malignant neoplasm of trachea, bronchus and lung: Secondary | ICD-10-CM

## 2019-07-11 DIAGNOSIS — F411 Generalized anxiety disorder: Secondary | ICD-10-CM | POA: Diagnosis present

## 2019-07-11 DIAGNOSIS — N611 Abscess of the breast and nipple: Secondary | ICD-10-CM | POA: Diagnosis not present

## 2019-07-11 DIAGNOSIS — I10 Essential (primary) hypertension: Secondary | ICD-10-CM | POA: Diagnosis present

## 2019-07-11 DIAGNOSIS — Z87891 Personal history of nicotine dependence: Secondary | ICD-10-CM

## 2019-07-11 DIAGNOSIS — B20 Human immunodeficiency virus [HIV] disease: Secondary | ICD-10-CM | POA: Diagnosis present

## 2019-07-11 DIAGNOSIS — Z803 Family history of malignant neoplasm of breast: Secondary | ICD-10-CM

## 2019-07-11 DIAGNOSIS — Z8042 Family history of malignant neoplasm of prostate: Secondary | ICD-10-CM

## 2019-07-11 DIAGNOSIS — F17210 Nicotine dependence, cigarettes, uncomplicated: Secondary | ICD-10-CM | POA: Diagnosis present

## 2019-07-11 DIAGNOSIS — Z823 Family history of stroke: Secondary | ICD-10-CM

## 2019-07-11 DIAGNOSIS — Z833 Family history of diabetes mellitus: Secondary | ICD-10-CM

## 2019-07-11 DIAGNOSIS — Z79810 Long term (current) use of selective estrogen receptor modulators (SERMs): Secondary | ICD-10-CM

## 2019-07-11 DIAGNOSIS — L0390 Cellulitis, unspecified: Secondary | ICD-10-CM | POA: Diagnosis present

## 2019-07-11 LAB — COMPREHENSIVE METABOLIC PANEL
ALT: 21 U/L (ref 0–44)
AST: 34 U/L (ref 15–41)
Albumin: 3 g/dL — ABNORMAL LOW (ref 3.5–5.0)
Alkaline Phosphatase: 79 U/L (ref 38–126)
Anion gap: 10 (ref 5–15)
BUN: 33 mg/dL — ABNORMAL HIGH (ref 6–20)
CO2: 26 mmol/L (ref 22–32)
Calcium: 8.8 mg/dL — ABNORMAL LOW (ref 8.9–10.3)
Chloride: 103 mmol/L (ref 98–111)
Creatinine, Ser: 1.79 mg/dL — ABNORMAL HIGH (ref 0.44–1.00)
GFR calc Af Amer: 37 mL/min — ABNORMAL LOW (ref >60)
GFR calc non Af Amer: 32 mL/min — ABNORMAL LOW (ref >60)
Glucose, Bld: 238 mg/dL — ABNORMAL HIGH (ref 70–99)
Potassium: 3.6 mmol/L (ref 3.5–5.1)
Sodium: 139 mmol/L (ref 135–145)
Total Bilirubin: 0.2 mg/dL — ABNORMAL LOW (ref 0.3–1.2)
Total Protein: 7.8 g/dL (ref 6.5–8.1)

## 2019-07-11 LAB — CBC WITH DIFFERENTIAL/PLATELET
Abs Immature Granulocytes: 0.08 10*3/uL — ABNORMAL HIGH (ref 0.00–0.07)
Basophils Absolute: 0 10*3/uL (ref 0.0–0.1)
Basophils Relative: 0 %
Eosinophils Absolute: 0.1 10*3/uL (ref 0.0–0.5)
Eosinophils Relative: 1 %
HCT: 36 % (ref 36.0–46.0)
Hemoglobin: 11.7 g/dL — ABNORMAL LOW (ref 12.0–15.0)
Immature Granulocytes: 1 %
Lymphocytes Relative: 24 %
Lymphs Abs: 1.9 10*3/uL (ref 0.7–4.0)
MCH: 30.4 pg (ref 26.0–34.0)
MCHC: 32.5 g/dL (ref 30.0–36.0)
MCV: 93.5 fL (ref 80.0–100.0)
Monocytes Absolute: 0.6 10*3/uL (ref 0.1–1.0)
Monocytes Relative: 8 %
Neutro Abs: 5.2 10*3/uL (ref 1.7–7.7)
Neutrophils Relative %: 66 %
Platelets: 158 10*3/uL (ref 150–400)
RBC: 3.85 MIL/uL — ABNORMAL LOW (ref 3.87–5.11)
RDW: 16.9 % — ABNORMAL HIGH (ref 11.5–15.5)
WBC: 7.9 10*3/uL (ref 4.0–10.5)
nRBC: 0 % (ref 0.0–0.2)

## 2019-07-11 LAB — SARS CORONAVIRUS 2 (TAT 6-24 HRS): SARS Coronavirus 2: NEGATIVE

## 2019-07-11 LAB — TSH: TSH: 17.98 u[IU]/mL — ABNORMAL HIGH (ref 0.35–4.50)

## 2019-07-11 LAB — BRAIN NATRIURETIC PEPTIDE: B Natriuretic Peptide: 22.5 pg/mL (ref 0.0–100.0)

## 2019-07-11 LAB — CBG MONITORING, ED
Glucose-Capillary: 165 mg/dL — ABNORMAL HIGH (ref 70–99)
Glucose-Capillary: 255 mg/dL — ABNORMAL HIGH (ref 70–99)

## 2019-07-11 MED ORDER — DOLUTEGRAVIR SODIUM 50 MG PO TABS
50.0000 mg | ORAL_TABLET | Freq: Every day | ORAL | Status: DC
  Administered 2019-07-11 – 2019-07-15 (×5): 50 mg via ORAL
  Filled 2019-07-11 (×5): qty 1

## 2019-07-11 MED ORDER — IRBESARTAN 150 MG PO TABS
150.0000 mg | ORAL_TABLET | Freq: Every day | ORAL | Status: DC
  Administered 2019-07-12 – 2019-07-15 (×4): 150 mg via ORAL
  Filled 2019-07-11 (×4): qty 1

## 2019-07-11 MED ORDER — ACETAMINOPHEN 650 MG RE SUPP
650.0000 mg | Freq: Four times a day (QID) | RECTAL | Status: DC | PRN
  Filled 2019-07-11: qty 1

## 2019-07-11 MED ORDER — ROSUVASTATIN CALCIUM 10 MG PO TABS
10.0000 mg | ORAL_TABLET | Freq: Every day | ORAL | Status: DC
  Administered 2019-07-11 – 2019-07-15 (×5): 10 mg via ORAL
  Filled 2019-07-11 (×5): qty 1

## 2019-07-11 MED ORDER — MORPHINE SULFATE (PF) 2 MG/ML IV SOLN
1.0000 mg | INTRAVENOUS | Status: DC | PRN
  Administered 2019-07-11 – 2019-07-14 (×6): 1 mg via INTRAVENOUS
  Filled 2019-07-11 (×8): qty 1

## 2019-07-11 MED ORDER — LEVOTHYROXINE SODIUM 25 MCG PO TABS
175.0000 ug | ORAL_TABLET | Freq: Every day | ORAL | Status: DC
  Administered 2019-07-12 – 2019-07-15 (×3): 175 ug via ORAL
  Filled 2019-07-11 (×3): qty 1

## 2019-07-11 MED ORDER — VANCOMYCIN HCL IN DEXTROSE 1-5 GM/200ML-% IV SOLN
1000.0000 mg | Freq: Once | INTRAVENOUS | Status: AC
  Administered 2019-07-11: 1000 mg via INTRAVENOUS
  Filled 2019-07-11: qty 200

## 2019-07-11 MED ORDER — INSULIN ASPART 100 UNIT/ML ~~LOC~~ SOLN
0.0000 [IU] | Freq: Every day | SUBCUTANEOUS | Status: DC
  Administered 2019-07-11: 3 [IU] via SUBCUTANEOUS
  Administered 2019-07-14: 2 [IU] via SUBCUTANEOUS
  Filled 2019-07-11: qty 0.05

## 2019-07-11 MED ORDER — VANCOMYCIN HCL 10 G IV SOLR
1250.0000 mg | INTRAVENOUS | Status: DC
  Administered 2019-07-13: 1250 mg via INTRAVENOUS
  Filled 2019-07-11: qty 1250

## 2019-07-11 MED ORDER — VANCOMYCIN HCL IN DEXTROSE 1-5 GM/200ML-% IV SOLN: 1000.0000 mg | Freq: Once | INTRAVENOUS | Status: DC

## 2019-07-11 MED ORDER — IRBESARTAN-HYDROCHLOROTHIAZIDE 150-12.5 MG PO TABS: 1.0000 | ORAL_TABLET | Freq: Every day | ORAL | Status: DC

## 2019-07-11 MED ORDER — ACETAMINOPHEN 325 MG PO TABS
650.0000 mg | ORAL_TABLET | Freq: Four times a day (QID) | ORAL | Status: DC | PRN
  Administered 2019-07-15: 650 mg via ORAL
  Filled 2019-07-11: qty 2

## 2019-07-11 MED ORDER — SPIRONOLACTONE 25 MG PO TABS
25.0000 mg | ORAL_TABLET | Freq: Every day | ORAL | Status: DC
  Administered 2019-07-12 – 2019-07-15 (×4): 25 mg via ORAL
  Filled 2019-07-11 (×4): qty 1

## 2019-07-11 MED ORDER — HYDROCHLOROTHIAZIDE 12.5 MG PO CAPS
12.5000 mg | ORAL_CAPSULE | Freq: Every day | ORAL | Status: DC
  Administered 2019-07-12 – 2019-07-15 (×4): 12.5 mg via ORAL
  Filled 2019-07-11 (×4): qty 1

## 2019-07-11 MED ORDER — EMTRICITABINE-TENOFOVIR AF 200-25 MG PO TABS
1.0000 | ORAL_TABLET | Freq: Every day | ORAL | Status: DC
  Administered 2019-07-11 – 2019-07-15 (×5): 1 via ORAL
  Filled 2019-07-11 (×5): qty 1

## 2019-07-11 MED ORDER — HYDROMORPHONE HCL 1 MG/ML IJ SOLN
1.0000 mg | Freq: Once | INTRAMUSCULAR | Status: AC
  Administered 2019-07-11: 1 mg via INTRAVENOUS
  Filled 2019-07-11: qty 1

## 2019-07-11 MED ORDER — METOPROLOL SUCCINATE ER 50 MG PO TB24
50.0000 mg | ORAL_TABLET | Freq: Every day | ORAL | Status: DC
  Administered 2019-07-12 – 2019-07-15 (×4): 50 mg via ORAL
  Filled 2019-07-11 (×4): qty 1

## 2019-07-11 MED ORDER — AMLODIPINE BESYLATE 10 MG PO TABS
10.0000 mg | ORAL_TABLET | Freq: Every day | ORAL | Status: DC
  Administered 2019-07-12 – 2019-07-15 (×4): 10 mg via ORAL
  Filled 2019-07-11: qty 2
  Filled 2019-07-11 (×3): qty 1

## 2019-07-11 MED ORDER — TAMOXIFEN CITRATE 10 MG PO TABS
20.0000 mg | ORAL_TABLET | Freq: Every day | ORAL | Status: DC
  Administered 2019-07-11 – 2019-07-15 (×5): 20 mg via ORAL
  Filled 2019-07-11 (×5): qty 2

## 2019-07-11 MED ORDER — VANCOMYCIN HCL 10 G IV SOLR
1250.0000 mg | INTRAVENOUS | Status: AC
  Administered 2019-07-11: 1250 mg via INTRAVENOUS
  Filled 2019-07-11: qty 1250

## 2019-07-11 MED ORDER — INSULIN ASPART 100 UNIT/ML ~~LOC~~ SOLN
0.0000 [IU] | Freq: Three times a day (TID) | SUBCUTANEOUS | Status: DC
  Administered 2019-07-12: 8 [IU] via SUBCUTANEOUS
  Administered 2019-07-13 (×2): 3 [IU] via SUBCUTANEOUS
  Administered 2019-07-14 (×2): 2 [IU] via SUBCUTANEOUS
  Administered 2019-07-14: 5 [IU] via SUBCUTANEOUS
  Administered 2019-07-15: 3 [IU] via SUBCUTANEOUS
  Administered 2019-07-15: 2 [IU] via SUBCUTANEOUS
  Filled 2019-07-11: qty 0.15

## 2019-07-11 MED ORDER — HEPARIN SODIUM (PORCINE) 5000 UNIT/ML IJ SOLN
5000.0000 [IU] | Freq: Three times a day (TID) | INTRAMUSCULAR | Status: AC
  Administered 2019-07-11 – 2019-07-13 (×4): 5000 [IU] via SUBCUTANEOUS
  Filled 2019-07-11 (×7): qty 1

## 2019-07-11 NOTE — ED Notes (Signed)
Pt requested to speak to this RN and expression some dissatisfaction. Pt given chicken noodle soup, crackers and ice water. She was hooked back up to the monitor and lines were untangled. She was updated about the hospitals bed situation. She denies any other needs at this time.

## 2019-07-11 NOTE — Patient Instructions (Signed)
Stop by the lab prior to leaving today. I will notify you of your results once received.   Go to the [name] now, we will call the triage nurse.  It was a pleasure to see you today!

## 2019-07-11 NOTE — ED Provider Notes (Signed)
[facility] DEPT Provider Note   CSN: [id number] Arrival date & time: 07/11/19  1235     History   Chief Complaint Chief Complaint  Patient presents with  . Shortness of Breath  . Breast Pain  . Breast swelling    HPI [name] is a 53 y.o. female.     HPI Patient with ongoing therapy for malignancy presents with concern of right breast swelling, pain, fever, dyspnea. Patient had chemotherapy 9 days ago.  She has completed initial chemotherapy, surgery, radiation therapy, and is in transition to Herceptin for breast cancer. She has had no superficial, cutaneous changes to her breast during this therapy until the past week. Now, over the past week she has developed increasing pain, erythema, throughout to the right breast including the nipple. Pain is minimally improved with Percocet and/or Tylenol. Fever does improve with Tylenol. No other chest pain. Patient has baseline other medical issues including CHF, does have some mild ongoing dyspnea, but no exertional chest pain. Past Medical History:  Diagnosis Date  . Allergy   . Anemia    Normocytic  . Anxiety   . Asthma   . Blood dyscrasia   . Bronchitis 2005  . 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 Diabetes mellitus without complication ([place])   . Family history of breast cancer   . Family history of lung cancer   . Family history of prostate cancer   . Gastroenteritis 07/10/07  . GERD 07/24/2006  . GRAVE'S DISEASE 01/01/2008  . History of hidradenitis suppurativa   . History of kidney stones   . History of thrush   . HIV DISEASE 07/24/2006   dx March 05  . Hyperlipidemia   . HYPERTENSION 07/24/2006  . Hyperthyroidism 08/2006   Grave's Disease -diffuse radiotracer uptake 08/25/06 Thyroid scan-Cold nodule to R lower lobe of thyrorid  . Menometrorrhagia    hx of  . Nephrolithiasis   . 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 Personal history of chemotherapy    2020  . Personal history of radiation therapy    2020  . Pneumonia 2005  . 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 Sarcoidosis 02/08/2007   dx as a teenager in [place] from abnl CXR. Completed 2 yrs Prednisone after lung bx confirmation. No symptoms since then.  . Suppurative hidradenitis   . Thyroid cancer ([place])   . THYROID NODULE, RIGHT 02/08/2007    Patient Active Problem List   Diagnosis Date Noted  . Cellulitis of breast 07/11/2019  . Preventative health care 09/10/2018  . Genetic testing 07/25/2018  . Port-A-Cath in place 07/12/2018  . Family history of breast cancer   . Family history of prostate cancer   . Family history of lung cancer   . Malignant neoplasm of lower-inner quadrant of right breast of female, estrogen receptor positive ([place]) 05/10/2018  . DOE (dyspnea on exertion) 10/13/2017  . Hyperlipidemia 04/26/2017  . Chronic anxiety 04/11/2017  . Upper airway cough syndrome 11/11/2016  . Morbid (severe) obesity due to excess calories ([place]) 10/14/2016  . Shortness of breath 10/13/2016  . Allergic rhinitis 02/18/2016  . Right shoulder pain 02/18/2016  . Non-insulin dependent type 2 diabetes mellitus ([place]) 06/23/2015  . Renal calculi 10/29/2014  . Recurrent boils 06/11/2014  . Abscess of breast, right 01/09/2013  . Hidradenitis suppurativa of right >> left axilla 01/30/2012  . 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 SARCOIDOSIS 02/08/2007  . Cigarette smoker 02/08/2007  .  Human immunodeficiency virus (HIV) disease ([place]) 07/24/2006  . Depression 07/24/2006  . Essential hypertension 07/24/2006  . Asthma 07/24/2006  . GERD 07/24/2006    Past Surgical History:  Procedure Laterality Date  . BREAST EXCISIONAL BIOPSY Right 04/26/2018   right axilla negative  . BREAST EXCISIONAL BIOPSY Left 04/26/2018   left axilla negative  . BREAST LUMPECTOMY Right 10/03/2018   malignant  . BREAST LUMPECTOMY  WITH RADIOACTIVE SEED AND SENTINEL LYMPH NODE BIOPSY Right 10/03/2018   Procedure: RIGHT BREAST LUMPECTOMY WITH RADIOACTIVE SEED AND SENTINEL LYMPH NODE MAPPING;  Surgeon: Erroll Luna, MD;  Location: [facility];  Service: General;  Laterality: Right;  . BREAST SURGERY  1997   Breast Reduction   . CYSTOSCOPY W/ URETERAL STENT REMOVAL  11/09/2012   Procedure: CYSTOSCOPY WITH STENT REMOVAL;  Surgeon: Alexis Frock, MD;  Location: [facility];  Service: Urology;  Laterality: Right;  . CYSTOSCOPY WITH RETROGRADE PYELOGRAM, URETEROSCOPY AND STENT PLACEMENT  11/09/2012   Procedure: CYSTOSCOPY WITH RETROGRADE PYELOGRAM, URETEROSCOPY AND STENT PLACEMENT;  Surgeon: Alexis Frock, MD;  Location: [facility];  Service: Urology;  Laterality: Left;  LEFT URETEROSCOPY, STONE MANIPULATION, left STENT exchange   . CYSTOSCOPY WITH STENT PLACEMENT  10/02/2012   Procedure: CYSTOSCOPY WITH STENT PLACEMENT;  Surgeon: Alexis Frock, MD;  [facility];  Service: Urology;  Laterality: Left;  . DILATION AND CURETTAGE OF UTERUS  Feb 2004   s/p for 1st trimester nonviable pregnancy  . EYE SURGERY     sty under eyelid  . INCISE AND DRAIN ABCESS  Nov 03   s/p I &D for righ inframmary fold hidradenitis  . INCISION AND DRAINAGE PERITONSILLAR ABSCESS  Mar 03  . IR CV LINE INJECTION  06/07/2018  . IR IMAGING GUIDED PORT INSERTION  06/20/2018  . IR REMOVAL TUN ACCESS W/ PORT W/O FL MOD SED  06/20/2018  . IRRIGATION AND DEBRIDEMENT ABSCESS  01/31/2012   Procedure: IRRIGATION AND DEBRIDEMENT ABSCESS;  Surgeon: Shann Medal, MD;  [facility];  Service: General;  Laterality: Right;  right breast and axilla   . NEPHROLITHOTOMY  10/02/2012   Procedure: NEPHROLITHOTOMY PERCUTANEOUS;  Surgeon: Alexis Frock, MD;  [facility];  Service: Urology;  Laterality: Right;  First Stage Percutaneous Nephrolithotomy with Surgeon Access, Left Ureteral Stent    . NEPHROLITHOTOMY  10/04/2012   Procedure: NEPHROLITHOTOMY PERCUTANEOUS SECOND  LOOK;  Surgeon: Alexis Frock, MD;  [facility];  Service: Urology;  Laterality: Right;     . NEPHROLITHOTOMY  10/08/2012   Procedure: NEPHROLITHOTOMY PERCUTANEOUS;  Surgeon: Alexis Frock, MD;  [facility];  Service: Urology;  Laterality: Right;  THIRD STAGE, nephrostomy tube exchange x 2  . NEPHROLITHOTOMY  10/11/2012   Procedure: NEPHROLITHOTOMY PERCUTANEOUS SECOND LOOK;  Surgeon: Alexis Frock, MD;  [facility];  Service: Urology;  Laterality: Right;  RIGHT 4 STAGE PERCUTANOUS NEPHROLITHOTOMY, right URETEROSCOPY WITH HOLMIUM LASER   . PORTACATH PLACEMENT Left 05/17/2018   Procedure: INSERTION PORT-A-CATH;  Surgeon: Coralie Keens, MD;  Location: [facility];  Service: General;  Laterality: Left;  . RADICAL NECK DISSECTION  04/12/2017   limited/notes 04/12/2017  . RADICAL NECK DISSECTION N/A 04/12/2017   Procedure: RADICAL NECK DISSECTION;  Surgeon: Melida Quitter, MD;  Location: [facility];  Service: ENT;  Laterality: N/A;  limited neck dissection 2 hours total  . REDUCTION MAMMAPLASTY Bilateral 1998  . [place]  1994  . THYROIDECTOMY  04/12/2017   completion/notes 04/12/2017  . THYROIDECTOMY N/A 04/12/2017   Procedure: THYROIDECTOMY;  Surgeon: Melida Quitter, MD;  Location: [facility];  Service: ENT;  Laterality: N/A;  Completion Thyroidectomy  . TOTAL THYROIDECTOMY  2010     OB History   No obstetric history on file.      Home Medications    Prior to Admission medications   Medication Sig Start Date End Date Taking? Authorizing Provider  amLODipine (NORVASC) 10 MG tablet TAKE 1 TABLET BY MOUTH EVERY DAY FOR BLOOD PRESSURE Patient taking differently: Take 10 mg by mouth daily.  06/14/19  Yes [name], NP  cephALEXin (KEFLEX) 500 MG capsule Take 1 capsule (500 mg total) by mouth 3 (three) times daily. 05/19/19  Yes Delo, Nathaneil Canary, MD  ciprofloxacin (CIPRO) 500 MG tablet Take 1 tablet (500 mg total) by mouth 2 (two) times daily. 06/17/19  Yes Causey, Charlestine Massed, NP  dapagliflozin propanediol (FARXIGA) 10 MG TABS tablet Take 10 mg by mouth daily before breakfast. 06/28/19  Yes Bensimhon, Shaune Pascal, MD  doxycycline (VIBRA-TABS) 100 MG tablet 100 mg daily.  10/29/18  Yes [provider]  emtricitabine-tenofovir AF (DESCOVY) 200-25 MG tablet Take 1 tablet by mouth daily. 06/28/19  Yes Michel Bickers, MD  fluticasone (FLOVENT HFA) 110 MCG/ACT inhaler Inhale 1 puff into the lungs 2 (two) times daily. 06/05/19  Yes [name], NP  glipiZIDE (GLUCOTROL) 5 MG tablet Take 1 tablet (5 mg total) by mouth 2 (two) times daily before a meal. For diabetes. 06/06/19  Yes [name], NP  irbesartan-hydrochlorothiazide (AVALIDE) 150-12.5 MG tablet TAKE 1 TABLET BY MOUTH ONCE DAILY FOR BLOOD PRESSURE. Patient taking differently: Take 1 tablet by mouth daily.  12/25/18  Yes [name], NP  levothyroxine (SYNTHROID) 175 MCG tablet TAKE 1 TABLET BY MOUTH EVERY MORNING ON AN EMPTY STOMACH WITH A FULL GLASS OF WATER. Patient taking differently: Take 175 mcg by mouth daily before breakfast. TAKE  ON AN EMPTY STOMACH WITH A FULL GLASS OF WATER. 06/18/19  Yes [name], NP  metoprolol succinate (TOPROL-XL) 50 MG 24 hr tablet TAKE 1 TABLET (50 MG TOTAL) BY MOUTH DAILY. TAKE WITH OR IMMEDIATELY FOLLOWING A MEAL. 06/18/19  Yes [name], NP  Multiple Vitamin (MULTIVITAMIN) tablet Take 1 tablet by mouth daily.   Yes [provider]  rosuvastatin (CRESTOR) 10 MG tablet Take 1 tablet (10 mg total) by mouth daily. For cholesterol. 06/10/19  Yes [name], NP  spironolactone (ALDACTONE) 25 MG tablet Take 1 tablet (25 mg total) by mouth daily. 06/28/19 09/26/19 Yes Bensimhon, Shaune Pascal, MD  tamoxifen (NOLVADEX) 20 MG tablet Take 1 tablet (20 mg total) by mouth daily. 02/28/19  Yes Causey, Charlestine Massed, NP  [name] 50 MG tablet TAKE 1 TABLET BY MOUTH EVERY DAY Patient taking differently: Take 50 mg by mouth daily.  06/28/19  Yes  Michel Bickers, MD  fluconazole (DIFLUCAN) 100 MG tablet 2 tablets for first dose then 1 tab a day x 2 days Patient not taking: Reported on 07/11/2019 06/14/19   Gardenia Phlegm, NP  ONE [facility] LANCETS MISC Use as instructed to test blood sugar daily 06/08/17   [name], NP  [facility] VERIO test strip USE AS INSTRUCTED TO TEST BLOOD SUGAR DAILY NEED APPOINTMENT FOR ANY MORE REFILLS Patient taking differently: 1 each by Other route as needed (Blood Sugar).  08/21/18   [name], NP  potassium chloride (K-DUR) 10 MEQ tablet TAKE 2 TABLETS (20 MEQ TOTAL) BY MOUTH 2 (TWO) TIMES DAILY. Patient not  taking: Reported on 07/11/2019 05/20/19   Magrinat, Virgie Dad, MD  traMADol (ULTRAM) 50 MG tablet Take 1 tablet (50 mg total) by mouth every 6 (six) hours as needed. Patient not taking: Reported on 07/11/2019 05/20/19   Magrinat, Virgie Dad, MD  prochlorperazine (COMPAZINE) 10 MG tablet TAKE 1 TABLET BY MOUTH EVERY 6 HOURS AS NEEDED FOR NAUSEA OR VOMITING 08/24/18 12/27/18  Magrinat, Virgie Dad, MD    Family History Family History  Problem Relation Age of Onset  . Hypertension Mother   . Cancer Mother        laryngeal  . Heart disease Mother        stent  . Hypertension Father   . Lung cancer Father 31       hx smoking  . Heart disease Other   . Hypertension Other   . Stroke Other        Grandparent  . Kidney disease Other        Grandparent  . Diabetes Other        FH of Diabetes  . Hypertension Sister   . Cancer Maternal Uncle        Lung CA  . Hypertension Brother   . Hypertension Sister   . Breast cancer Maternal Aunt 65  . Breast cancer Paternal Aunt 26  . Prostate cancer Paternal Uncle   . Breast cancer Maternal Aunt        dx 60+  . Breast cancer Paternal Aunt        dx 20's  . Breast cancer Paternal Aunt        dx 68's  . Prostate cancer Paternal Uncle   . Lung cancer Paternal Uncle   . Breast cancer Cousin 31  . Breast cancer Cousin        dx <50  . Breast  cancer Cousin        dx <50  . Breast cancer Cousin        dx <50  . Colon cancer Neg Hx   . Esophageal cancer Neg Hx   . Rectal cancer Neg Hx   . Stomach cancer Neg Hx     Social History Social History   Tobacco Use  . Smoking status: Former Smoker    Packs/day: 0.50    Years: 15.00    Pack years: 7.50    Types: Cigarettes    Start date: 04/12/2017  . Smokeless tobacco: Never Used  . Tobacco comment: quit smoking  may 2018  Substance Use Topics  . Alcohol use: Yes    Alcohol/week: 0.0 standard drinks    Comment: social  . Drug use: No     Allergies   Genvoya [elviteg-cobic-emtricit-tenofaf], Lisinopril, and Valsartan   Review of Systems Review of Systems  Constitutional:       Per HPI, otherwise negative  HENT:       Per HPI, otherwise negative  Respiratory:       Per HPI, otherwise negative  Cardiovascular:       Per HPI, otherwise negative  Gastrointestinal: Negative for vomiting.  Endocrine:       Negative aside from HPI  Genitourinary:       Neg aside from HPI   Musculoskeletal:       Per HPI, otherwise negative  Skin: Positive for color change and wound.  Allergic/Immunologic: Positive for immunocompromised state.  Neurological: Negative for syncope.     Physical Exam Updated Vital Signs BP 129/69   Pulse 77  Temp 99 F (37.2 C) (Oral)   Resp (!) 22   LMP 03/31/2014 Comment: Negative Serum HCG 06/07/17  SpO2 93%   Physical Exam Vitals signs and nursing note reviewed.  Constitutional:      General: She is not in acute distress.    Appearance: She is well-developed.  HENT:     Head: Normocephalic and atraumatic.  Eyes:     Conjunctiva/sclera: Conjunctivae normal.  Cardiovascular:     Rate and Rhythm: Normal rate and regular rhythm.  Pulmonary:     Effort: Pulmonary effort is normal. No respiratory distress.     Breath sounds: Normal breath sounds. No stridor.  Chest:    Abdominal:     General: There is no distension.  Skin:     General: Skin is warm and dry.  Neurological:     Mental Status: She is alert and oriented to person, place, and time.     Cranial Nerves: No cranial nerve deficit.      ED Treatments / Results  Labs (all labs ordered are listed, but only abnormal results are displayed) Labs Reviewed  COMPREHENSIVE METABOLIC PANEL - Abnormal; Notable for the following components:      Result Value   Glucose, Bld 238 (*)    BUN 33 (*)    Creatinine, Ser 1.79 (*)    Calcium 8.8 (*)    Albumin 3.0 (*)    Total Bilirubin 0.2 (*)    GFR calc non Af Amer 32 (*)    GFR calc Af Amer 37 (*)    All other components within normal limits  CBC WITH DIFFERENTIAL/PLATELET - Abnormal; Notable for the following components:   RBC 3.85 (*)    Hemoglobin 11.7 (*)    RDW 16.9 (*)    Abs Immature Granulocytes 0.08 (*)    All other components within normal limits  SARS CORONAVIRUS 2 (TAT 6-24 HRS)  BRAIN NATRIURETIC PEPTIDE    EKG None  Radiology Dg Chest Port 1 View  Result Date: 07/11/2019 CLINICAL DATA:  53 year old female with a history of breast pain on the right EXAM: PORTABLE CHEST 1 VIEW COMPARISON:  June 28, 2019 FINDINGS: Cardiomediastinal silhouette unchanged in size and contour. No pneumothorax. No pleural effusion. No confluent airspace disease. Unchanged left chest wall IJ port catheter. IMPRESSION: Negative for acute cardiopulmonary disease. Left chest wall port catheter unchanged Electronically Signed   By: Corrie Mckusick D.O.   On: 07/11/2019 13:33    Procedures Procedures (including critical care time)  Medications Ordered in ED Medications  vancomycin (VANCOCIN) IVPB 1000 mg/200 mL premix (1,000 mg Intravenous New Bag/Given 07/11/19 1408)  HYDROmorphone (DILAUDID) injection 1 mg (1 mg Intravenous Given 07/11/19 1603)     Initial Impression / Assessment and Plan / ED Course  I have reviewed the triage vital signs and the nursing notes.  Pertinent labs & imaging results that were  available during my care of the patient were reviewed by me and considered in my medical decision making (see chart for details).        4:18 PM 4:18 PM I discussed the patient's case with her on colleges. Given concern for her relative immunocompromise state, new breast infection, the patient will be admitted She has already received initial IV antibiotics. Oncology will follow as a consulting team. Labs consistent with prior studies.  Final Clinical Impressions(s) / ED Diagnoses   Final diagnoses:  Cellulitis of breast     Carmin Muskrat, MD 07/11/19 [phone]

## 2019-07-11 NOTE — H&P (Signed)
History and Physical    [name] TIR:[id number] DOB: [birth year] DOA: 07/11/2019  PCP: [name], NP  Patient coming from: Home  Chief Complaint: R breast pain  HPI: [name] is a 53 y.o. female with medical history significant of Breast cancer s/p chemo and followed by Oncology, DM, HIV, HTN presents to ED with complaints of worsening R breast pain, tenderness, and swelling over the past week prior to admit. Prior to admit, pt reported subjective fevers, chills, sob. Pt already on doxycyline for tx of hidradenitis.   On further questioning, pt reports "popping a blackhead" near her R nipple 2 weeks prior to admit. Area then became more inflamed and later with increased pain and swelling. Dark colored drainage first noted by pt on the day of hospital presentation.  ED Course: In the ED, pt noted to be afebrile with no leukocytosis. CXR was clear. Cr of 1.79. Given failure of outpatient doxycycline, pt was started on empiric vancomycin and hospitalist consulted for consideration for admission.  Review of Systems:  Review of Systems  Constitutional: Positive for chills and fever. Negative for diaphoresis.  HENT: Negative for congestion, ear discharge, ear pain and sinus pain.   Eyes: Negative for double vision, photophobia and pain.  Respiratory: Negative for hemoptysis, sputum production and shortness of breath.   Cardiovascular: Negative for chest pain, palpitations and orthopnea.  Gastrointestinal: Negative for abdominal pain, nausea and vomiting.  Genitourinary: Negative for frequency, hematuria and urgency.  Musculoskeletal: Negative for back pain, joint pain and neck pain.  Neurological: Negative for tingling, tremors, seizures, loss of consciousness and headaches.  Psychiatric/Behavioral: Negative for hallucinations and memory loss. The patient is not nervous/anxious.     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   Diabetes mellitus without complication ([place])    Family history of breast cancer    Family history of lung cancer    Family history of prostate cancer    Gastroenteritis 07/10/07   GERD 07/24/2006   GRAVE'S DISEASE 01/01/2008   History of hidradenitis suppurativa    History of kidney stones    History of thrush    HIV DISEASE 07/24/2006   dx March 05   Hyperlipidemia    HYPERTENSION 07/24/2006   Hyperthyroidism 08/2006   Grave's Disease -diffuse radiotracer uptake 08/25/06 Thyroid scan-Cold nodule to R lower lobe of thyrorid   Menometrorrhagia    hx of   Nephrolithiasis    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 Completed 2 yrs Prednisone after lung bx confirmation. No symptoms since then.   Suppurative hidradenitis    Thyroid cancer ([place])    THYROID NODULE, RIGHT 02/08/2007    Past Surgical History:  Procedure Laterality Date   BREAST EXCISIONAL BIOPSY Right 04/26/2018   right axilla negative   BREAST EXCISIONAL BIOPSY Left 04/26/2018   left axilla negative   BREAST LUMPECTOMY Right 10/03/2018   malignant   BREAST LUMPECTOMY WITH RADIOACTIVE SEED AND SENTINEL LYMPH NODE BIOPSY Right 10/03/2018   Procedure:  RIGHT BREAST LUMPECTOMY WITH RADIOACTIVE SEED AND SENTINEL LYMPH NODE MAPPING;  Surgeon: Erroll Luna, MD;  Location: [facility];  Service: General;  Laterality: Right;   BREAST SURGERY  1997   Breast Reduction    CYSTOSCOPY W/ URETERAL STENT REMOVAL  11/09/2012   Procedure: CYSTOSCOPY WITH STENT REMOVAL;  Surgeon: Alexis Frock, MD;  Location: [facility];   Service: Urology;  Laterality: Right;   CYSTOSCOPY WITH RETROGRADE PYELOGRAM, URETEROSCOPY AND STENT PLACEMENT  11/09/2012   Procedure: CYSTOSCOPY WITH RETROGRADE PYELOGRAM, URETEROSCOPY AND STENT PLACEMENT;  Surgeon: Alexis Frock, MD;  Location: [facility];  Service: Urology;  Laterality: Left;  LEFT URETEROSCOPY, STONE MANIPULATION, left STENT exchange    CYSTOSCOPY WITH STENT PLACEMENT  10/02/2012   Procedure: CYSTOSCOPY WITH STENT PLACEMENT;  Surgeon: Alexis Frock, MD;  Location: [facility];  Service: Urology;  Laterality: Left;   DILATION AND CURETTAGE OF UTERUS  Feb 2004   s/p for 1st trimester nonviable pregnancy   EYE SURGERY     sty under eyelid   INCISE AND DRAIN ABCESS  Nov 03   s/p I &D for righ inframmary fold hidradenitis   INCISION AND DRAINAGE PERITONSILLAR ABSCESS  Mar 03   IR CV LINE INJECTION  06/07/2018   IR IMAGING GUIDED PORT INSERTION  06/20/2018   IR REMOVAL TUN ACCESS W/ PORT W/O FL MOD SED  06/20/2018   IRRIGATION AND DEBRIDEMENT ABSCESS  01/31/2012   Procedure: IRRIGATION AND DEBRIDEMENT ABSCESS;  Surgeon: Shann Medal, MD;  Location: [facility];  Service: General;  Laterality: Right;  right breast and axilla    NEPHROLITHOTOMY  10/02/2012   Procedure: NEPHROLITHOTOMY PERCUTANEOUS;  Surgeon: Alexis Frock, MD;  Location: [facility];  Service: Urology;  Laterality: Right;  First Stage Percutaneous Nephrolithotomy with Surgeon Access, Left Ureteral Stent     NEPHROLITHOTOMY  10/04/2012   Procedure: NEPHROLITHOTOMY PERCUTANEOUS SECOND LOOK;  Surgeon: Alexis Frock, MD;  [facility];  Service: Urology;  Laterality: Right;      NEPHROLITHOTOMY  10/08/2012   Procedure: NEPHROLITHOTOMY PERCUTANEOUS;  Surgeon: Alexis Frock, MD;  Location[facility];  Service: Urology;  Laterality: Right;  THIRD STAGE, nephrostomy tube exchange x 2   NEPHROLITHOTOMY  10/11/2012   Procedure: NEPHROLITHOTOMY PERCUTANEOUS SECOND LOOK;  Surgeon: Alexis Frock, MD;  Location[facility];   Service: Urology;  Laterality: Right;  RIGHT 4 STAGE PERCUTANOUS NEPHROLITHOTOMY, right URETEROSCOPY WITH HOLMIUM LASER    PORTACATH PLACEMENT Left 05/17/2018   Procedure: INSERTION PORT-A-CATH;  Surgeon: Coralie Keens, MD;  Location: [facility];  Service: General;  Laterality: Left;   RADICAL NECK DISSECTION  04/12/2017   limited/notes 04/12/2017   RADICAL NECK DISSECTION N/A 04/12/2017   Procedure: RADICAL NECK DISSECTION;  Surgeon: Melida Quitter, MD;  Location: [facility];  Service: ENT;  Laterality: N/A;  limited neck dissection 2 hours total   REDUCTION MAMMAPLASTY Bilateral Toast  04/12/2017   completion/notes 04/12/2017   THYROIDECTOMY N/A 04/12/2017   Procedure: THYROIDECTOMY;  Surgeon: Melida Quitter, MD;  Location: [facility];  Service: ENT;  Laterality: N/A;  Completion Thyroidectomy   TOTAL THYROIDECTOMY  2010     reports that she has quit smoking. Her smoking use included cigarettes. She started smoking about 2 years ago. She has a 7.50 pack-year smoking history. She has never used smokeless tobacco. She reports current alcohol use. She reports that she does not use drugs.  Allergies  Allergen Reactions   Genvoya [Elviteg-Cobic-Emtricit-Tenofaf] Hives  Lisinopril Cough   Valsartan Other (See Comments) and Cough    Pt states she tolerates medicine now    Family History  Problem Relation Age of Onset   Hypertension Mother    Cancer Mother        laryngeal   Heart disease Mother        stent   Hypertension Father    Lung cancer Father 27       hx smoking   Heart disease Other    Hypertension Other    Stroke Other        Grandparent   Kidney disease Other        Grandparent   Diabetes Other        FH of Diabetes   Hypertension Sister    Cancer Maternal Uncle        Lung CA   Hypertension Brother    Hypertension Sister    Breast cancer Maternal Aunt 65   Breast cancer Paternal Aunt 44   Prostate  cancer Paternal Uncle    Breast cancer Maternal Aunt        dx 35+   Breast cancer Paternal Aunt        dx 70's   Breast cancer Paternal Aunt        dx 50's   Prostate cancer Paternal Uncle    Lung cancer Paternal Uncle    Breast cancer Cousin 69   Breast cancer Cousin        dx <50   Breast cancer Cousin        dx <50   Breast cancer Cousin        dx <50   Colon cancer Neg Hx    Esophageal cancer Neg Hx    Rectal cancer Neg Hx    Stomach cancer Neg Hx     Prior to Admission medications   Medication Sig Start Date End Date Taking? Authorizing Provider  amLODipine (NORVASC) 10 MG tablet TAKE 1 TABLET BY MOUTH EVERY DAY FOR BLOOD PRESSURE Patient taking differently: Take 10 mg by mouth daily.  06/14/19  Yes [name], NP  cephALEXin (KEFLEX) 500 MG capsule Take 1 capsule (500 mg total) by mouth 3 (three) times daily. 05/19/19  Yes Delo, Nathaneil Canary, MD  ciprofloxacin (CIPRO) 500 MG tablet Take 1 tablet (500 mg total) by mouth 2 (two) times daily. 06/17/19  Yes Causey, Charlestine Massed, NP  dapagliflozin propanediol (FARXIGA) 10 MG TABS tablet Take 10 mg by mouth daily before breakfast. 06/28/19  Yes Bensimhon, Shaune Pascal, MD  doxycycline (VIBRA-TABS) 100 MG tablet 100 mg daily.  10/29/18  Yes [provider]  emtricitabine-tenofovir AF (DESCOVY) 200-25 MG tablet Take 1 tablet by mouth daily. 06/28/19  Yes Michel Bickers, MD  fluticasone (FLOVENT HFA) 110 MCG/ACT inhaler Inhale 1 puff into the lungs 2 (two) times daily. 06/05/19  Yes [name], NP  glipiZIDE (GLUCOTROL) 5 MG tablet Take 1 tablet (5 mg total) by mouth 2 (two) times daily before a meal. For diabetes. 06/06/19  Yes [name], NP  irbesartan-hydrochlorothiazide (AVALIDE) 150-12.5 MG tablet TAKE 1 TABLET BY MOUTH ONCE DAILY FOR BLOOD PRESSURE. Patient taking differently: Take 1 tablet by mouth daily.  12/25/18  Yes [name], NP  levothyroxine (SYNTHROID) 175 MCG tablet TAKE 1  TABLET BY MOUTH EVERY MORNING ON AN EMPTY STOMACH WITH A FULL GLASS OF WATER. Patient taking differently: Take 175 mcg by mouth daily before breakfast. TAKE  ON AN EMPTY  STOMACH WITH A FULL GLASS OF WATER. 06/18/19  Yes [name], NP  metoprolol succinate (TOPROL-XL) 50 MG 24 hr tablet TAKE 1 TABLET (50 MG TOTAL) BY MOUTH DAILY. TAKE WITH OR IMMEDIATELY FOLLOWING A MEAL. 06/18/19  Yes [name], NP  Multiple Vitamin (MULTIVITAMIN) tablet Take 1 tablet by mouth daily.   Yes [provider]  rosuvastatin (CRESTOR) 10 MG tablet Take 1 tablet (10 mg total) by mouth daily. For cholesterol. 06/10/19  Yes [name], NP  spironolactone (ALDACTONE) 25 MG tablet Take 1 tablet (25 mg total) by mouth daily. 06/28/19 09/26/19 Yes Bensimhon, Shaune Pascal, MD  tamoxifen (NOLVADEX) 20 MG tablet Take 1 tablet (20 mg total) by mouth daily. 02/28/19  Yes Causey, Charlestine Massed, NP  [name] 50 MG tablet TAKE 1 TABLET BY MOUTH EVERY DAY Patient taking differently: Take 50 mg by mouth daily.  06/28/19  Yes Michel Bickers, MD  fluconazole (DIFLUCAN) 100 MG tablet 2 tablets for first dose then 1 tab a day x 2 days Patient not taking: Reported on 07/11/2019 06/14/19   Gardenia Phlegm, NP  ONE [facility] LANCETS MISC Use as instructed to test blood sugar daily 06/08/17   [name], NP  [facility] VERIO test strip USE AS INSTRUCTED TO TEST BLOOD SUGAR DAILY NEED APPOINTMENT FOR ANY MORE REFILLS Patient taking differently: 1 each by Other route as needed (Blood Sugar).  08/21/18   [name], NP  potassium chloride (K-DUR) 10 MEQ tablet TAKE 2 TABLETS (20 MEQ TOTAL) BY MOUTH 2 (TWO) TIMES DAILY. Patient not taking: Reported on 07/11/2019 05/20/19   Magrinat, Virgie Dad, MD  traMADol (ULTRAM) 50 MG tablet Take 1 tablet (50 mg total) by mouth every 6 (six) hours as needed. Patient not taking: Reported on 07/11/2019 05/20/19   Magrinat, Virgie Dad, MD  prochlorperazine (COMPAZINE) 10 MG tablet TAKE  1 TABLET BY MOUTH EVERY 6 HOURS AS NEEDED FOR NAUSEA OR VOMITING 08/24/18 12/27/18  Magrinat, Virgie Dad, MD    Physical Exam: Vitals:   07/11/19 1301 07/11/19 1303 07/11/19 1305  BP: 129/69    Pulse:  77   Resp:   (!) 22  Temp:   99 F (37.2 C)  TempSrc:   Oral  SpO2:  93%     Constitutional: NAD, calm, comfortable Vitals:   07/11/19 1301 07/11/19 1303 07/11/19 1305  BP: 129/69    Pulse:  77   Resp:   (!) 22  Temp:   99 F (37.2 C)  TempSrc:   Oral  SpO2:  93%    Eyes: PERRL, lids and conjunctivae normal ENMT: Mucous membranes are moist. Posterior pharynx clear of any exudate or lesions.Normal dentition.  Neck: normal, supple, no masses, no thyromegaly Respiratory: clear to auscultation bilaterally, no wheezing, no crackles. Normal respiratory effort. No accessory muscle use.  Cardiovascular: Regular rate and rhythm,  No extremity edema. 2+ pedal pulses. Abdomen: no tenderness, no masses palpated. No hepatosplenomegaly. Bowel sounds positive.  Musculoskeletal: no clubbing / cyanosis. No joint deformity upper and lower extremities. Good ROM, no contractures. Normal muscle tone.  Skin: R breast erythema, tenderness, swelling with no active drainage Neurologic: CN 2-12 grossly intact. Sensation intact, Strength 5/5 in all 4.  Psychiatric: Normal judgment and insight. Alert and oriented x 3. Normal mood.    Labs on Admission: I have personally reviewed following labs and imaging studies  CBC: Recent Labs  Lab 07/11/19 1400  WBC 7.9  NEUTROABS 5.2  HGB 11.7*  HCT 36.0  MCV 93.5  PLT 295   Basic Metabolic Panel: Recent Labs  Lab 07/11/19 1400  NA 139  K 3.6  CL 103  CO2 26  GLUCOSE 238*  BUN 33*  CREATININE 1.79*  CALCIUM 8.8*   GFR: Estimated Creatinine Clearance: 45.1 mL/min (A) (by C-G formula based on SCr of 1.79 mg/dL (H)). Liver Function Tests: Recent Labs  Lab 07/11/19 1400  AST 34  ALT 21  ALKPHOS 79  BILITOT 0.2*  PROT 7.8  ALBUMIN 3.0*    No results for input(s): LIPASE, AMYLASE in the last 168 hours. No results for input(s): AMMONIA in the last 168 hours. Coagulation Profile: No results for input(s): INR, PROTIME in the last 168 hours. Cardiac Enzymes: No results for input(s): CKTOTAL, CKMB, CKMBINDEX, TROPONINI in the last 168 hours. BNP (last 3 results) No results for input(s): PROBNP in the last 8760 hours. HbA1C: No results for input(s): HGBA1C in the last 72 hours. CBG: No results for input(s): GLUCAP in the last 168 hours. Lipid Profile: No results for input(s): CHOL, HDL, LDLCALC, TRIG, CHOLHDL, LDLDIRECT in the last 72 hours. Thyroid Function Tests: Recent Labs    07/11/19 1134  TSH 17.98*   Anemia Panel: No results for input(s): VITAMINB12, FOLATE, FERRITIN, TIBC, IRON, RETICCTPCT in the last 72 hours. Urine analysis:    Component Value Date/Time   COLORURINE YELLOW 06/14/2019 1045   APPEARANCEUR CLOUDY (A) 06/14/2019 1045   LABSPEC 1.013 06/14/2019 1045   PHURINE 6.0 06/14/2019 1045   GLUCOSEU NEGATIVE 06/14/2019 1045   GLUCOSEU NEG mg/dL 01/23/2008 1418   HGBUR LARGE (A) 06/14/2019 1045   BILIRUBINUR NEGATIVE 06/14/2019 1045   BILIRUBINUR negative 12/29/2017 1046   KETONESUR NEGATIVE 06/14/2019 1045   PROTEINUR >=300 (A) 06/14/2019 1045   UROBILINOGEN 0.2 12/29/2017 1046   UROBILINOGEN 0.2 10/21/2014 2347   NITRITE NEGATIVE 06/14/2019 1045   LEUKOCYTESUR LARGE (A) 06/14/2019 1045   Sepsis Labs: !!!!!!!!!!!!!!!!!!!!!!!!!!!!!!!!!!!!!!!!!!!! @LABRCNTIP (procalcitonin:4,lacticidven:4) )No results found for this or any previous visit (from the past 240 hour(s)).   Radiological Exams on Admission: Dg Chest Port 1 View  Result Date: 07/11/2019 CLINICAL DATA:  53 year old female with a history of breast pain on the right EXAM: PORTABLE CHEST 1 VIEW COMPARISON:  June 28, 2019 FINDINGS: Cardiomediastinal silhouette unchanged in size and contour. No pneumothorax. No pleural effusion. No  confluent airspace disease. Unchanged left chest wall IJ port catheter. IMPRESSION: Negative for acute cardiopulmonary disease. Left chest wall port catheter unchanged Electronically Signed   By: Corrie Mckusick D.O.   On: 07/11/2019 13:33    EKG: Independently reviewed. QTC of 500 on 9/11, reviewed. Will repeat ekg  Assessment/Plan Principal Problem:   Cellulitis of breast Active Problems:   Human immunodeficiency virus (HIV) disease ([place])   Essential hypertension   Non-insulin dependent type 2 diabetes mellitus ([place])   Hyperlipidemia   Malignant neoplasm of lower-inner quadrant of right breast of female, estrogen receptor positive ([place])   1. R breast cellulitis, failing outpatient doxycycline 1. Increased pain, redness, swelling of R breast noted while on PO doxy prior to admit 2. Currently afebrile with no leukocytosis 3. Question about drainage prior to admit. Will order breast US to rule out underlying drainable abscess 4. Agree with empiric vanc for now. Will continue with plan to de-escalate abx as pt improves 2. HIV 1. Most recent CD4 of 36%, 1,110 cells on 12/19 2. Viral load of <20 on 12/19 3. Continue anti-viral regimen 3. HTN 1. BP stable at present  2. Cont home meds as tolerated 4. DM2 1. Random glucose of 238 2. Will continue on SSI coverage while in hospital 5. HLD 1. LDL of 171 on 06/05/19 2. Continue statin as tolerated 6. R breast cancer 1. Followed by Oncology, consulted by EDP  DVT prophylaxis: heparin subq  Code Status: Full Family Communication: Pt in room  Disposition Plan: Uncertain at this time  Consults called: Oncology Admission status: Inpatient given failure of outpatient doxycycline and needing IV vancomycin to treat worsening cellulitis   Marylu Lund MD [facility] Pager On Amion  If 7PM-7AM, please contact night-coverage  07/11/2019, 4:45 PM

## 2019-07-11 NOTE — Progress Notes (Signed)
Pharmacy Antibiotic Note  [name] is a 53 y.o. female admitted on 07/11/2019 with right breast cellulitis.  Pharmacy has been consulted for Vancomycin dosing.  Plan: Vancomycin 1g IV x 1 given in the ED. Give an additional 1250mg  IV x 1 now for total 2250mg  loading dose. Then continue with Vancomycin 1250mg  IV q36h. Vancomycin levels at steady state, as indicated. Monitor renal function, cultures, clinical course.      Temp (24hrs), Avg:98.3 F (36.8 C), Min:97.6 F (36.4 C), Max:99 F (37.2 C)  Recent Labs  Lab 07/11/19 1400  WBC 7.9  CREATININE 1.79*    Estimated Creatinine Clearance: 45.1 mL/min (A) (by C-G formula based on SCr of 1.79 mg/dL (H)).    Allergies  Allergen Reactions  . Genvoya [Elviteg-Cobic-Emtricit-Tenofaf] Hives  . Lisinopril Cough  . Valsartan Other (See Comments) and Cough    Pt states she tolerates medicine now    Antimicrobials this admission: 9/24 Vancomycin >>  Microbiology results: 9/24 COVID: ordered  Thank you for allowing pharmacy to be a part of this patient's care.  Luiz Ochoa 07/11/2019 5:53 PM

## 2019-07-11 NOTE — ED Triage Notes (Signed)
Patient complains of painful, red and swollen breast around the nipple area that has worsened since her chemo treatment last Tuesday. She reports having a fever over the weekend, chills and shortness of breath with walking. She reports feeling "tired".

## 2019-07-11 NOTE — Progress Notes (Signed)
Subjective:    Patient ID: [name], female    DOB: 21-Nov-1965, 53 y.o.   MRN: [id number]  HPI  [name] is a 53 year old female with a history of type 2 diabetes, hypothyroidism, HIV, thyroid cancer with hypothyroidism, right sided breast cancer who presents today with a chief complaint of breast pain and swelling.  She first noticed discomfort to the right breast on 07/02/19 while at her oncologist's office. Her oncologist took a look and didn't seem concerned. Since then she's noticed gradual swelling, redness, and pain. Yesterday she noticed nipple drainage with dark brown fluid. She's been running fevers of 101 for the last 5 days, has been taking Tylenol around the clock since.   Review of Systems  Constitutional: Positive for fever.  Genitourinary:       Right breast pain  Skin: Positive for color change and wound.       Past Medical History:  Diagnosis Date  . Allergy   . Anemia    Normocytic  . Anxiety   . Asthma   . Blood dyscrasia   . Bronchitis 2005  . 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 Diabetes mellitus without complication ([place])   . Family history of breast cancer   . Family history of lung cancer   . Family history of prostate cancer   . Gastroenteritis 07/10/07  . GERD 07/24/2006  . GRAVE'S DISEASE 01/01/2008  . History of hidradenitis suppurativa   . History of kidney stones   . History of thrush   . HIV DISEASE 07/24/2006   dx March 05  . Hyperlipidemia   . HYPERTENSION 07/24/2006  . Hyperthyroidism 08/2006   Grave's Disease -diffuse radiotracer uptake 08/25/06 Thyroid scan-Cold nodule to R lower lobe of thyrorid  . Menometrorrhagia    hx of  . Nephrolithiasis   . 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 Personal history of chemotherapy    2020  . Personal history of radiation therapy    2020  . Pneumonia 2005  . 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  Sarcoidosis 02/08/2007   dx as a teenager in [place] from abnl CXR. Completed 2 yrs Prednisone after lung bx confirmation. No symptoms since then.  . Suppurative hidradenitis   . Thyroid cancer ([place])   . THYROID NODULE, RIGHT 02/08/2007     Social History   Socioeconomic History  . Marital status: Single    Spouse name: Not on file  . Number of children: 1  . Years of education: Not on file  . Highest education level: Not on file  Occupational History  . Occupation: Secondary school teacher  Social Needs  . Financial resource strain: Not on file  . Food insecurity    Worry: Not on file    Inability: Not on file  . Transportation needs    Medical: No    Non-medical: No  Tobacco Use  . Smoking status: Former Smoker    Packs/day: 0.50    Years: 15.00    Pack years: 7.50    Types: Cigarettes    Start date: 04/12/2017  . Smokeless tobacco: Never Used  . Tobacco comment: quit smoking  may 2018  Substance and Sexual Activity  . Alcohol use: Yes    Alcohol/week: 0.0 standard drinks    Comment: social  . Drug use: No  . Sexual activity: Not Currently    Birth control/protection: Post-menopausal    Comment: declined condoms  Lifestyle  .  Physical activity    Days per week: Not on file    Minutes per session: Not on file  . Stress: Not on file  Relationships  . Social Herbalist on phone: Not on file    Gets together: Not on file    Attends religious service: Not on file    Active member of club or organization: Not on file    Attends meetings of clubs or organizations: Not on file    Relationship status: Not on file  . Intimate partner violence    Fear of current or ex partner: No    Emotionally abused: No    Physically abused: No    Forced sexual activity: No  Other Topics Concern  . Not on file  Social History Narrative  . Not on file    Past Surgical History:  Procedure Laterality Date  . BREAST EXCISIONAL BIOPSY Right 04/26/2018   right axilla negative   . BREAST EXCISIONAL BIOPSY Left 04/26/2018   left axilla negative  . BREAST LUMPECTOMY Right 10/03/2018   malignant  . BREAST LUMPECTOMY WITH RADIOACTIVE SEED AND SENTINEL LYMPH NODE BIOPSY Right 10/03/2018   Procedure: RIGHT BREAST LUMPECTOMY WITH RADIOACTIVE SEED AND SENTINEL LYMPH NODE MAPPING;  Surgeon: Erroll Luna, MD;  Location: [facility];  Service: General;  Laterality: Right;  . BREAST SURGERY  1997   Breast Reduction   . CYSTOSCOPY W/ URETERAL STENT REMOVAL  11/09/2012   Procedure: CYSTOSCOPY WITH STENT REMOVAL;  Surgeon: Alexis Frock, MD;  Location: [facility];  Service: Urology;  Laterality: Right;  . CYSTOSCOPY WITH RETROGRADE PYELOGRAM, URETEROSCOPY AND STENT PLACEMENT  11/09/2012   Procedure: CYSTOSCOPY WITH RETROGRADE PYELOGRAM, URETEROSCOPY AND STENT PLACEMENT;  Surgeon: Alexis Frock, MD;  Location: [facility];  Service: Urology;  Laterality: Left;  LEFT URETEROSCOPY, STONE MANIPULATION, left STENT exchange   . CYSTOSCOPY WITH STENT PLACEMENT  10/02/2012   Procedure: CYSTOSCOPY WITH STENT PLACEMENT;  Surgeon: Alexis Frock, MD;  Location: [facility];  Service: Urology;  Laterality: Left;  . DILATION AND CURETTAGE OF UTERUS  Feb 2004   s/p for 1st trimester nonviable pregnancy  . EYE SURGERY     sty under eyelid  . INCISE AND DRAIN ABCESS  Nov 03   s/p I &D for righ inframmary fold hidradenitis  . INCISION AND DRAINAGE PERITONSILLAR ABSCESS  Mar 03  . IR CV LINE INJECTION  06/07/2018  . IR IMAGING GUIDED PORT INSERTION  06/20/2018  . IR REMOVAL TUN ACCESS W/ PORT W/O FL MOD SED  06/20/2018  . IRRIGATION AND DEBRIDEMENT ABSCESS  01/31/2012   Procedure: IRRIGATION AND DEBRIDEMENT ABSCESS;  Surgeon: Shann Medal, MD;  Location: [facility];  Service: General;  Laterality: Right;  right breast and axilla   . NEPHROLITHOTOMY  10/02/2012   Procedure: NEPHROLITHOTOMY PERCUTANEOUS;  Surgeon: Alexis Frock, MD;  Location[facility];  Service: Urology;  Laterality: Right;  First Stage Percutaneous  Nephrolithotomy with Surgeon Access, Left Ureteral Stent    . NEPHROLITHOTOMY  10/04/2012   Procedure: NEPHROLITHOTOMY PERCUTANEOUS SECOND LOOK;  Surgeon: Alexis Frock, MD;  [facility];  Service: Urology;  Laterality: Right;     . NEPHROLITHOTOMY  10/08/2012   Procedure: NEPHROLITHOTOMY PERCUTANEOUS;  Surgeon: Alexis Frock, MD;  Location[facility];  Service: Urology;  Laterality: Right;  THIRD STAGE, nephrostomy tube exchange x 2  . NEPHROLITHOTOMY  10/11/2012   Procedure: NEPHROLITHOTOMY PERCUTANEOUS SECOND LOOK;  Surgeon: Alexis Frock, MD;  Location[facility];  Service: Urology;  Laterality: Right;  RIGHT 4 STAGE PERCUTANOUS NEPHROLITHOTOMY, right URETEROSCOPY WITH HOLMIUM LASER   . PORTACATH PLACEMENT Left 05/17/2018   Procedure: INSERTION PORT-A-CATH;  Surgeon: Coralie Keens, MD;  Location: [facility];  Service: General;  Laterality: Left;  . RADICAL NECK DISSECTION  04/12/2017   limited/notes 04/12/2017  . RADICAL NECK DISSECTION N/A 04/12/2017   Procedure: RADICAL NECK DISSECTION;  Surgeon: Melida Quitter, MD;  Location: [facility];  Service: ENT;  Laterality: N/A;  limited neck dissection 2 hours total  . REDUCTION MAMMAPLASTY Bilateral 1998  . [place]  1994  . THYROIDECTOMY  04/12/2017   completion/notes 04/12/2017  . THYROIDECTOMY N/A 04/12/2017   Procedure: THYROIDECTOMY;  Surgeon: Melida Quitter, MD;  Location: [facility];  Service: ENT;  Laterality: N/A;  Completion Thyroidectomy  . TOTAL THYROIDECTOMY  2010    Family History  Problem Relation Age of Onset  . Hypertension Mother   . Cancer Mother        laryngeal  . Heart disease Mother        stent  . Hypertension Father   . Lung cancer Father 28       hx smoking  . Heart disease Other   . Hypertension Other   . Stroke Other        Grandparent  . Kidney disease Other        Grandparent  . Diabetes Other        FH of Diabetes  . Hypertension Sister   . Cancer Maternal Uncle        Lung CA  .  Hypertension Brother   . Hypertension Sister   . Breast cancer Maternal Aunt 65  . Breast cancer Paternal Aunt 40  . Prostate cancer Paternal Uncle   . Breast cancer Maternal Aunt        dx 60+  . Breast cancer Paternal Aunt        dx 76's  . Breast cancer Paternal Aunt        dx 36's  . Prostate cancer Paternal Uncle   . Lung cancer Paternal Uncle   . Breast cancer Cousin 55  . Breast cancer Cousin        dx <50  . Breast cancer Cousin        dx <50  . Breast cancer Cousin        dx <50  . Colon cancer Neg Hx   . Esophageal cancer Neg Hx   . Rectal cancer Neg Hx   . Stomach cancer Neg Hx     Allergies  Allergen Reactions  . Genvoya [Elviteg-Cobic-Emtricit-Tenofaf] Hives  . Lisinopril Cough  . Valsartan Other (See Comments) and Cough    Pt states she tolerates medicine now    Current Outpatient Medications on File Prior to Visit  Medication Sig Dispense Refill  . amLODipine (NORVASC) 10 MG tablet TAKE 1 TABLET BY MOUTH EVERY DAY FOR BLOOD PRESSURE 90 tablet 1  . cephALEXin (KEFLEX) 500 MG capsule Take 1 capsule (500 mg total) by mouth 3 (three) times daily. 21 capsule 0  . ciprofloxacin (CIPRO) 500 MG tablet Take 1 tablet (500 mg total) by mouth 2 (two) times daily. 10 tablet 0  . dapagliflozin propanediol (FARXIGA) 10 MG TABS tablet Take 10 mg by mouth daily before breakfast. 30 tablet 6  . doxycycline (VIBRA-TABS) 100 MG tablet 100 mg daily.     Marland Kitchen emtricitabine-tenofovir AF (DESCOVY) 200-25 MG tablet Take 1 tablet by mouth daily. 30 tablet 2  .  fluconazole (DIFLUCAN) 100 MG tablet 2 tablets for first dose then 1 tab a day x 2 days 4 tablet 0  . fluticasone (FLOVENT HFA) 110 MCG/ACT inhaler Inhale 1 puff into the lungs 2 (two) times daily. 1 Inhaler 2  . glipiZIDE (GLUCOTROL) 5 MG tablet Take 1 tablet (5 mg total) by mouth 2 (two) times daily before a meal. For diabetes. 180 tablet 0  . irbesartan-hydrochlorothiazide (AVALIDE) 150-12.5 MG tablet TAKE 1 TABLET BY MOUTH  ONCE DAILY FOR BLOOD PRESSURE. 90 tablet 1  . levothyroxine (SYNTHROID) 175 MCG tablet TAKE 1 TABLET BY MOUTH EVERY MORNING ON AN EMPTY STOMACH WITH A FULL GLASS OF WATER. 90 tablet 0  . metoprolol succinate (TOPROL-XL) 50 MG 24 hr tablet TAKE 1 TABLET (50 MG TOTAL) BY MOUTH DAILY. TAKE WITH OR IMMEDIATELY FOLLOWING A MEAL. 90 tablet 0  . Multiple Vitamin (MULTIVITAMIN) tablet Take 1 tablet by mouth daily.    . ONE TOUCH LANCETS MISC Use as instructed to test blood sugar daily 200 each 5  . ONETOUCH VERIO test strip USE AS INSTRUCTED TO TEST BLOOD SUGAR DAILY NEED APPOINTMENT FOR ANY MORE REFILLS 50 each 0  . potassium chloride (K-DUR) 10 MEQ tablet TAKE 2 TABLETS (20 MEQ TOTAL) BY MOUTH 2 (TWO) TIMES DAILY. 60 tablet 1  . rosuvastatin (CRESTOR) 10 MG tablet Take 1 tablet (10 mg total) by mouth daily. For cholesterol. 90 tablet 0  . spironolactone (ALDACTONE) 25 MG tablet Take 1 tablet (25 mg total) by mouth daily. 30 tablet 6  . tamoxifen (NOLVADEX) 20 MG tablet Take 1 tablet (20 mg total) by mouth daily. 30 tablet 5  . [name] 50 MG tablet TAKE 1 TABLET BY MOUTH EVERY DAY 30 tablet 2  . traMADol (ULTRAM) 50 MG tablet Take 1 tablet (50 mg total) by mouth every 6 (six) hours as needed. 30 tablet 0  . [DISCONTINUED] prochlorperazine (COMPAZINE) 10 MG tablet TAKE 1 TABLET BY MOUTH EVERY 6 HOURS AS NEEDED FOR NAUSEA OR VOMITING 30 tablet 2   Current Facility-Administered Medications on File Prior to Visit  Medication Dose Route Frequency Provider Last Rate Last Dose  . sodium chloride flush (NS) 0.9 % injection 10 mL  10 mL Intracatheter PRN Magrinat, Virgie Dad, MD   10 mL at 09/19/18 1551    BP 140/86   Pulse 82   Temp 97.6 F (36.4 C) (Temporal)   Ht 5\' 5"  (1.651 m)   Wt 235 lb 8 oz (106.8 kg)   LMP 03/31/2014 Comment: Negative Serum HCG 06/07/17  SpO2 99%   BMI 39.19 kg/m    Objective:   Physical Exam  Constitutional: She appears well-nourished.  Respiratory: Effort normal.  Skin:  There is erythema.  Moderate erythema with moderate tenderness to right breast. Skin texture firm. Brown drainage coming from nipple. Entire breast engorged.           Assessment & Plan:

## 2019-07-11 NOTE — ED Notes (Addendum)
Patient informed the RN that she was hungry earlier today. Patient and RN discussed some options available to eat and the patient told the RN she wanted soup, crackers, cheese and cranberry juice. When the RN bought in the food, the patient informed the RN that she could not eat the tomato soup (which was the only soup the RN saw available at the time). The patient informed the RN that she could have food brought in. RN also got a diet order and had a tray ordered as another solution for the patient just in case the patient would want it. The patient expressed a desire to know her blood glucose so the RN took her CBG. CBG was 165. When the tray arrived the RN delivered it to the patient right before the [place] started. Later, while the RN was giving the patient medication, the patient told the RN that she couldn't eat the food because it was cold. The food appeared picked over on her tray. The patient also expressed need to use the restroom. RN helped the patient out of the room to go to the bathroom. Patient was able to get to and from the bathroom back to her room on her own. Later the patient's daughter approached the RN letting her know the IV pump had shut off and the patient was hungry. The RN went to the patient room and reset the pump. She also discussed possible food options with the patient. When the RN mentioned we had chicken and beef broth, the patient became upset that she did not mention that earlier. The RN explained that she brought in what the patient had asked for and when the patient said she could not eat the soup, she had a dinner tray ordered for her. The patient, belligerently complained again that the food was so cold she couldn't eat it and it took too long. The RN explained that she did not have control over that but was willing to get her something to eat at the present time. The patient continued to reprimand the RN about the cold food and not mentioning the broth earlier. The patient and  her daughter also included that the RN should have given her something else since she was a diabetic. The RN again reminded the patient that she brought in the food she asked for and when she was told the patient could not eat the soup had a tray ordered for her. The RN again asked the patient what she would like the RN to get for her now. Instead on telling the RN what she wanted, the patient and daughter continued to lash out at the RN. The RN explained that she had not thought of the broth at the time but can get her some now if she liked. The patient and daughter still continued to antagonize the RN. The RN told them she couldn't go back and change that, but was more than willing to get her what she wanted at this point. The patient then told the RN that she did not want anything. The daughter then told the RN she did need to bring the patient something. The RN again asked what they wanted. The patient said she didn't want anything from anyone that didn't want to do anything for her. She again stated that she was willing to get her something to eat, she just needed to tell her what she wanted. The patient then told the RN nothing and to get out. The RN said ok and  let the patient know she had medication coming up and if she changed her mind and wanted something to call out and let her know. The patient then told the RN that she wanted a new RN because she felt like she had bad bedside manner and her spirit did not agree with her. She asked the RN if another RN could take care of her. The RN told her she could get the charge RN for her. The patient then yelled at the RN to go get the charge RN and to also get her something she could use to go report the RN online. The last statements were yelled at the RN was leaving the room to get the charge RN. After the charge RN spoke with the patient, the RN gave report to [facility] RN to take over care.

## 2019-07-11 NOTE — Assessment & Plan Note (Signed)
Exam today suggestive of cellulitis to right breast.  Given presentation, tenderness on exam, discomfort of patient, fevers, and patient history we will need to send her to the ED for IV antibiotics, labs, and imaging.   We will send her to [facility], report to be called to Triage.

## 2019-07-12 ENCOUNTER — Telehealth (HOSPITAL_COMMUNITY): Payer: Self-pay

## 2019-07-12 ENCOUNTER — Other Ambulatory Visit: Payer: Self-pay | Admitting: Primary Care

## 2019-07-12 DIAGNOSIS — Z87891 Personal history of nicotine dependence: Secondary | ICD-10-CM

## 2019-07-12 DIAGNOSIS — E119 Type 2 diabetes mellitus without complications: Secondary | ICD-10-CM

## 2019-07-12 DIAGNOSIS — N186 End stage renal disease: Secondary | ICD-10-CM | POA: Diagnosis present

## 2019-07-12 DIAGNOSIS — N184 Chronic kidney disease, stage 4 (severe): Secondary | ICD-10-CM | POA: Diagnosis present

## 2019-07-12 DIAGNOSIS — E1122 Type 2 diabetes mellitus with diabetic chronic kidney disease: Secondary | ICD-10-CM | POA: Diagnosis present

## 2019-07-12 DIAGNOSIS — N61 Mastitis without abscess: Secondary | ICD-10-CM

## 2019-07-12 DIAGNOSIS — E785 Hyperlipidemia, unspecified: Secondary | ICD-10-CM

## 2019-07-12 DIAGNOSIS — F419 Anxiety disorder, unspecified: Secondary | ICD-10-CM

## 2019-07-12 DIAGNOSIS — D649 Anemia, unspecified: Secondary | ICD-10-CM | POA: Diagnosis present

## 2019-07-12 DIAGNOSIS — Z79899 Other long term (current) drug therapy: Secondary | ICD-10-CM

## 2019-07-12 DIAGNOSIS — N183 Chronic kidney disease, stage 3 unspecified: Secondary | ICD-10-CM | POA: Diagnosis present

## 2019-07-12 LAB — GLUCOSE, CAPILLARY
Glucose-Capillary: 103 mg/dL — ABNORMAL HIGH (ref 70–99)
Glucose-Capillary: 164 mg/dL — ABNORMAL HIGH (ref 70–99)

## 2019-07-12 LAB — COMPREHENSIVE METABOLIC PANEL
ALT: 21 U/L (ref 0–44)
AST: 33 U/L (ref 15–41)
Albumin: 2.9 g/dL — ABNORMAL LOW (ref 3.5–5.0)
Alkaline Phosphatase: 81 U/L (ref 38–126)
Anion gap: 9 (ref 5–15)
BUN: 27 mg/dL — ABNORMAL HIGH (ref 6–20)
CO2: 26 mmol/L (ref 22–32)
Calcium: 8.6 mg/dL — ABNORMAL LOW (ref 8.9–10.3)
Chloride: 105 mmol/L (ref 98–111)
Creatinine, Ser: 1.55 mg/dL — ABNORMAL HIGH (ref 0.44–1.00)
GFR calc Af Amer: 44 mL/min — ABNORMAL LOW (ref >60)
GFR calc non Af Amer: 38 mL/min — ABNORMAL LOW (ref >60)
Glucose, Bld: 121 mg/dL — ABNORMAL HIGH (ref 70–99)
Potassium: 3.6 mmol/L (ref 3.5–5.1)
Sodium: 140 mmol/L (ref 135–145)
Total Bilirubin: 0.4 mg/dL (ref 0.3–1.2)
Total Protein: 7.7 g/dL (ref 6.5–8.1)

## 2019-07-12 LAB — CBC
HCT: 35.2 % — ABNORMAL LOW (ref 36.0–46.0)
Hemoglobin: 11.4 g/dL — ABNORMAL LOW (ref 12.0–15.0)
MCH: 30.5 pg (ref 26.0–34.0)
MCHC: 32.4 g/dL (ref 30.0–36.0)
MCV: 94.1 fL (ref 80.0–100.0)
Platelets: 161 10*3/uL (ref 150–400)
RBC: 3.74 MIL/uL — ABNORMAL LOW (ref 3.87–5.11)
RDW: 17 % — ABNORMAL HIGH (ref 11.5–15.5)
WBC: 7.9 10*3/uL (ref 4.0–10.5)
nRBC: 0 % (ref 0.0–0.2)

## 2019-07-12 LAB — T-HELPER CELLS (CD4) COUNT (NOT AT ARMC)
CD4 % Helper T Cell: 40 % (ref 33–65)
CD4 T Cell Abs: 778 /uL (ref 400–1790)

## 2019-07-12 LAB — CBG MONITORING, ED
Glucose-Capillary: 110 mg/dL — ABNORMAL HIGH (ref 70–99)
Glucose-Capillary: 252 mg/dL — ABNORMAL HIGH (ref 70–99)

## 2019-07-12 MED ORDER — CHLORHEXIDINE GLUCONATE CLOTH 2 % EX PADS
6.0000 | MEDICATED_PAD | Freq: Every day | CUTANEOUS | Status: DC
  Administered 2019-07-13 – 2019-07-14 (×2): 6 via TOPICAL

## 2019-07-12 NOTE — ED Notes (Addendum)
Pt provided breakfast tray stated " this is cold I don't want it" This RN offered to warm up. Pt still denied and reports "my daughter will bring me something". Will continue to monitor

## 2019-07-12 NOTE — Progress Notes (Signed)
Patient ID: [name], female   DOB: 1966/03/10, 53 y.o.   MRN: [id number]         [facility]    Date of Admission:  07/11/2019           Day 1 vancomycin       Reason for Consult: Right breast cellulitis and abscesses    Referring Provider: Dr. Gunnar Bulla Magrinat  Assessment: Evelyn has right breast cellulitis/mastitis complicated by at least 2 small abscesses.  I recommend admission.  It would be best to have the abscesses drained, either by IR aspiration or general surgery I&D.  Once specimens are obtained and sent for stain and culture I would add ampicillin sulbactam to vancomycin.  In the past her hidradenitis lesions and abscesses have been polymicrobial.  We will continue her Descovy and [name].  I will check her CD4 count and viral load and arrange follow-up in my clinic.  I will have my partner, Dr.Kees Tommy Medal [phone]) follow with you this weekend.  Plan: 1. Continue vancomycin 2. Recommend abscess treated with specimen sent for stain and culture 3. Add ampicillin sulbactam after abscess drainage 4. Continue Descovy and [name] 5. 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 amLODipine  10 mg Oral Daily  . dolutegravir  50 mg Oral Daily  . emtricitabine-tenofovir AF  1 tablet Oral Daily  . heparin  5,000 Units Subcutaneous Q8H  . hydrochlorothiazide  12.5 mg Oral Daily  . insulin aspart  0-15 Units  Subcutaneous TID WC  . insulin aspart  0-5 Units Subcutaneous QHS  . irbesartan  150 mg Oral Daily  . levothyroxine  175 mcg Oral Q0600  . metoprolol succinate  50 mg Oral Daily  . rosuvastatin  10 mg Oral Daily  . spironolactone  25 mg Oral Daily  . tamoxifen  20 mg Oral Daily   Continuous Infusions: . [START ON 07/13/2019] vancomycin     PRN Meds:.acetaminophen **OR** acetaminophen, morphine injection  HPI: [name] is a 53 y.o. female with breast cancer, hidradenitis and HIV infection who has been undergoing therapy for her cancer.  She has been taking oral doxycycline as chronic suppression for her hidradenitis and had noticed significant improvement in her chronic draining axillary and breast lesions.  However, about 2 weeks ago she began to notice some swelling of her right breast.  Over the last several days it is become increasingly swollen and painful.  She had a little bit of drainage from her right nipple yesterday and came to the emergency department.  She has had subjective fevers.  She has been afebrile here but was noted to have right breast cellulitis.  Ultrasound revealed several retroareolar abscesses.  She was started on IV vancomycin.  She has not missed any doses of her Descovy or [name].  She has been out of work on [employer] since August.  Her chronic anxiety has improved since  being at home.   Review of Systems: Review of Systems  Constitutional: Positive for fever. Negative for chills and diaphoresis.  Respiratory: Negative for cough.   Cardiovascular: Negative for chest pain.  Gastrointestinal: Negative for nausea and vomiting.  Psychiatric/Behavioral: Negative for depression. The patient is nervous/anxious.     Past Medical History:  Diagnosis Date  . Allergy   . Anemia    Normocytic  . Anxiety   . Asthma   . Blood dyscrasia   . Bronchitis 2005  . 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 Diabetes mellitus without complication ([place])   .  Family history of breast cancer   . Family history of lung cancer   . Family history of prostate cancer   . Gastroenteritis 07/10/07  . GERD 07/24/2006  . GRAVE'S DISEASE 01/01/2008  . History of hidradenitis suppurativa   . History of kidney stones   . History of thrush   . HIV DISEASE 07/24/2006   dx March 05  . Hyperlipidemia   . HYPERTENSION 07/24/2006  . Hyperthyroidism 08/2006   Grave's Disease -diffuse radiotracer uptake 08/25/06 Thyroid scan-Cold nodule to R lower lobe of thyrorid  . Menometrorrhagia    hx of  . Nephrolithiasis   . 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 Personal history of chemotherapy    2020  . Personal history of radiation therapy    2020  . Pneumonia 2005  . 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 Sarcoidosis 02/08/2007   dx as a teenager in [place] from abnl CXR. Completed 2 yrs Prednisone after lung bx confirmation. No symptoms since then.  . Suppurative hidradenitis   . Thyroid cancer ([place])   . THYROID NODULE, RIGHT 02/08/2007    Social History   Tobacco Use  . Smoking status: Former Smoker    Packs/day: 0.50    Years: 15.00    Pack years: 7.50    Types: Cigarettes    Start date: 04/12/2017  . Smokeless tobacco: Never Used  . Tobacco comment: quit smoking  may 2018  Substance Use Topics  . Alcohol use: Yes    Alcohol/week: 0.0 standard drinks    Comment: social  . Drug use: No    Family History  Problem Relation Age of Onset  . Hypertension Mother   . Cancer Mother        laryngeal  . Heart disease Mother        stent  . Hypertension Father   . Lung cancer Father 38       hx smoking  . Heart disease Other   . Hypertension Other   . Stroke Other        Grandparent  . Kidney disease Other        Grandparent  . Diabetes Other        FH of Diabetes  . Hypertension Sister   . Cancer Maternal Uncle        Lung CA  . Hypertension Brother   . Hypertension Sister    . Breast cancer Maternal Aunt 65  . Breast cancer Paternal Aunt 90  . Prostate cancer Paternal Uncle   . Breast cancer Maternal Aunt        dx 60+  . Breast cancer Paternal Aunt        dx 48's  . Breast cancer Paternal Aunt        dx 51's  . Prostate cancer Paternal Uncle   . Lung cancer Paternal Uncle   .  Breast cancer Cousin 52  . Breast cancer Cousin        dx <50  . Breast cancer Cousin        dx <50  . Breast cancer Cousin        dx <50  . Colon cancer Neg Hx   . Esophageal cancer Neg Hx   . Rectal cancer Neg Hx   . Stomach cancer Neg Hx    Allergies  Allergen Reactions  . Genvoya [Elviteg-Cobic-Emtricit-Tenofaf] Hives  . Lisinopril Cough  . Valsartan Other (See Comments) and Cough    Pt states she tolerates medicine now    OBJECTIVE: Blood pressure (!) 192/111, pulse 85, temperature 98.3 F (36.8 C), temperature source Oral, resp. rate 18, height 5\' 5"  (1.651 m), weight 106.1 kg, last menstrual period 03/31/2014, SpO2 94 %.  Physical Exam Constitutional:      Comments: She is smiling and in good spirits resting on a stretcher in the emergency department.  Cardiovascular:     Rate and Rhythm: Normal rate and regular rhythm.     Heart sounds: No murmur.  Pulmonary:     Effort: Pulmonary effort is normal.     Breath sounds: Normal breath sounds.  Chest:    Psychiatric:        Mood and Affect: Mood normal.     Lab Results Lab Results  Component Value Date   WBC 7.9 07/12/2019   HGB 11.4 (L) 07/12/2019   HCT 35.2 (L) 07/12/2019   MCV 94.1 07/12/2019   PLT 161 07/12/2019    Lab Results  Component Value Date   CREATININE 1.55 (H) 07/12/2019   BUN 27 (H) 07/12/2019   NA 140 07/12/2019   K 3.6 07/12/2019   CL 105 07/12/2019   CO2 26 07/12/2019    Lab Results  Component Value Date   ALT 21 07/12/2019   AST 33 07/12/2019   ALKPHOS 81 07/12/2019   BILITOT 0.4 07/12/2019     Microbiology: Recent Results (from the past 240 hour(s))  SARS  CORONAVIRUS 2 (TAT 6-24 HRS) Nasopharyngeal Nasopharyngeal Swab     Status: None   Collection Time: 07/11/19  2:00 PM   Specimen: Nasopharyngeal Swab  Result Value Ref Range Status   SARS Coronavirus 2 NEGATIVE NEGATIVE Final    Comment: (NOTE) SARS-CoV-2 target nucleic acids are NOT DETECTED. The SARS-CoV-2 RNA is generally detectable in upper and lower respiratory specimens during the acute phase of infection. Negative results do not preclude SARS-CoV-2 infection, do not rule out co-infections with other pathogens, and should not be used as the sole basis for treatment or other patient management decisions. Negative results must be combined with clinical observations, patient history, and epidemiological information. The expected result is Negative. Fact Sheet for Patients: SugarRoll.be Fact Sheet for Healthcare Providers: https://www.woods-mathews.com/ This test is not yet approved or cleared by the [place] FDA and  has been authorized for detection and/or diagnosis of SARS-CoV-2 by FDA under an Emergency Use Authorization (EUA). This EUA will remain  in effect (meaning this test can be used) for the duration of the COVID-19 declaration under Section 56 4(b)(1) of the Act, 21 U.S.C. section 360bbb-3(b)(1), unless the authorization is terminated or revoked sooner. Performed at [facility], [place] [address], [place], [place] [address]     Michel Bickers, [place] [facility] [phone] pager   [phone] cell 07/12/2019, 11:41 AM

## 2019-07-12 NOTE — Telephone Encounter (Signed)
Received a Request from  [facility] for medical records. Faxed the records to [phone] on 07/12/2019.

## 2019-07-12 NOTE — ED Notes (Signed)
Daughter at bedside. Pt eating food provided by daughter.

## 2019-07-12 NOTE — Progress Notes (Addendum)
PROGRESS NOTE    [name]  [name]  DOB: Mar 12, 1966  DOA: 07/11/2019 PCP: [name], NP  Brief Narrative:   53 y.o. female with medical history significant of Breast cancer s/p chemo and followed by Oncology, DM, HIV, HTN presents to ED with complaints of worsening R breast pain, tenderness, and swelling after she tried to pop a pimple/blackhead 2 week PTA.Area then became more inflamed with increased pain,swelling and she noted dark colored drainage She also reported subjective fevers, chills, sob. Pt already on oral daily doxycyline for tx of hidradenitis. Recently completed 5 day course of oral cipro for UTI (8/28) ED Course: In the ED, pt noted to be afebrile with no leukocytosis. CXR was clear. Cr of 1.79. Given failure of outpatient doxycycline, pt was started on empiric vancomycin and hospitalist consulted for consideration for admission. Patient evaluated by Dr [name] this morning and agreed with inpatient IV abx therapy.   Subjective:  Patient seen in the ED earlier today is still awaiting inpatient bed.  She states she was evaluated by Dr. [name] earlier.  She is feeling better overall.   Objective: Vitals:   07/12/19 0809 07/12/19 0830 07/12/19 0900 07/12/19 0930  BP: 137/85 (!) 152/98 (!) 161/96 (!) 192/111  Pulse: 80 74 78 85  Resp: 18   18  Temp:      TempSrc:      SpO2: 96% 93% 94% 94%  Weight:      Height:        Intake/Output Summary (Last 24 hours) at 07/12/2019 1109 Last data filed at 07/11/2019 1508 Gross per 24 hour  Intake 199.77 ml  Output --  Net 199.77 ml   Filed Weights   07/12/19 0532  Weight: 106.1 kg    Physical Examination:  General exam: Appears calm and comfortable  Respiratory system: Clear to auscultation. Respiratory effort normal. Cardiovascular system: S1 & S2 heard, RRR. No JVD, murmurs, rubs, gallops or clicks. No pedal edema. Gastrointestinal system: Abdomen is nondistended, soft and nontender. No  organomegaly or masses felt. Normal bowel sounds heard. Central nervous system: Alert and oriented. No focal neurological deficits. Extremities: Symmetric 5 x 5 power. Skin: Right-sided retroareolar induration with adjacent chronic superficial abrasion/ulcerations.  No active drainage Psychiatry: Judgement and insight appear normal. Mood & affect appropriate.     Data Reviewed: I have personally reviewed following labs and imaging studies  CBC: Recent Labs  Lab 07/11/19 1400 07/12/19 0532  WBC 7.9 7.9  NEUTROABS 5.2  --   HGB 11.7* 11.4*  HCT 36.0 35.2*  MCV 93.5 94.1  PLT 158 240   Basic Metabolic Panel: Recent Labs  Lab 07/11/19 1400 07/12/19 0532  NA 139 140  K 3.6 3.6  CL 103 105  CO2 26 26  GLUCOSE 238* 121*  BUN 33* 27*  CREATININE 1.79* 1.55*  CALCIUM 8.8* 8.6*   GFR: Estimated Creatinine Clearance: 51.9 mL/min (A) (by C-G formula based on SCr of 1.55 mg/dL (H)). Liver Function Tests: Recent Labs  Lab 07/11/19 1400 07/12/19 0532  AST 34 33  ALT 21 21  ALKPHOS 79 81  BILITOT 0.2* 0.4  PROT 7.8 7.7  ALBUMIN 3.0* 2.9*   No results for input(s): LIPASE, AMYLASE in the last 168 hours. No results for input(s): AMMONIA in the last 168 hours. Coagulation Profile: No results for input(s): INR, PROTIME in the last 168 hours. Cardiac Enzymes: No results for input(s): CKTOTAL, CKMB, CKMBINDEX, TROPONINI in the last 168 hours. BNP (last  3 results) No results for input(s): PROBNP in the last 8760 hours. HbA1C: No results for input(s): HGBA1C in the last 72 hours. CBG: Recent Labs  Lab 07/11/19 1757 07/11/19 2244 07/12/19 0808  GLUCAP 165* 255* 110*   Lipid Profile: No results for input(s): CHOL, HDL, LDLCALC, TRIG, CHOLHDL, LDLDIRECT in the last 72 hours. Thyroid Function Tests: Recent Labs    07/11/19 1134  TSH 17.98*   Anemia Panel: No results for input(s): VITAMINB12, FOLATE, FERRITIN, TIBC, IRON, RETICCTPCT in the last 72 hours. Sepsis  Labs: No results for input(s): PROCALCITON, LATICACIDVEN in the last 168 hours.  Recent Results (from the past 240 hour(s))  SARS CORONAVIRUS 2 (TAT 6-24 HRS) Nasopharyngeal Nasopharyngeal Swab     Status: None   Collection Time: 07/11/19  2:00 PM   Specimen: Nasopharyngeal Swab  Result Value Ref Range Status   SARS Coronavirus 2 NEGATIVE NEGATIVE Final    Comment: (NOTE) SARS-CoV-2 target nucleic acids are NOT DETECTED. The SARS-CoV-2 RNA is generally detectable in upper and lower respiratory specimens during the acute phase of infection. Negative results do not preclude SARS-CoV-2 infection, do not rule out co-infections with other pathogens, and should not be used as the sole basis for treatment or other patient management decisions. Negative results must be combined with clinical observations, patient history, and epidemiological information. The expected result is Negative. Fact Sheet for Patients: SugarRoll.be Fact Sheet for Healthcare Providers: https://www.woods-mathews.com/ This test is not yet approved or cleared by the [place] FDA and  has been authorized for detection and/or diagnosis of SARS-CoV-2 by FDA under an Emergency Use Authorization (EUA). This EUA will remain  in effect (meaning this test can be used) for the duration of the COVID-19 declaration under Section 56 4(b)(1) of the Act, 21 U.S.C. section 360bbb-3(b)(1), unless the authorization is terminated or revoked sooner. Performed at [facility], [place] [address], [place], [place] [address]       Radiology Studies: Dg Chest [place] 1 View  Result Date: 07/11/2019 CLINICAL DATA:  53 year old female with a history of breast pain on the right EXAM: PORTABLE CHEST 1 VIEW COMPARISON:  June 28, 2019 FINDINGS: Cardiomediastinal silhouette unchanged in size and contour. No pneumothorax. No pleural effusion. No confluent airspace disease. Unchanged left chest  wall IJ port catheter. IMPRESSION: Negative for acute cardiopulmonary disease. Left chest wall port catheter unchanged Electronically Signed   By: Corrie Mckusick D.O.   On: 07/11/2019 13:33   [place] Chest Soft Tissue  Result Date: 07/11/2019 CLINICAL DATA:  Examination is of the right breast. Patient has nipple discharge and pain. She underwent a right lumpectomy in November 2019. EXAM: ULTRASOUND OF THE RIGHT BREAST COMPARISON:  None. FINDINGS: Targeted ultrasound is performed, showing diffuse edema and hyperechoic fat consistent with inflammation. In the retroareolar region there is a complex collection measuring 2.3 x 2.1 cm with an adjacent similar appearing collection measuring 1.6 x 1.1 cm. These findings are consistent with abscesses. IMPRESSION: Findings consistent with right mastitis with right breast retroareolar abscess/abscesses. RECOMMENDATION: 1. Recommend beginning antibiotic therapy targeting the typical breast infection causes. Consider doxycycline, Augmentin or clindamycin. 2. Follow-up diagnostic evaluation in the breast Center tomorrow or the first of next week for reassessment and probable abscess aspiration. I have discussed the findings and recommendations with the patient. If applicable, a reminder letter will be sent to the patient regarding the next appointment. BI-RADS CATEGORY  2: Benign. Electronically Signed   By: Lajean Manes M.D.   On: 07/11/2019  19:48        Scheduled Meds:  amLODipine  10 mg Oral Daily   dolutegravir  50 mg Oral Daily   emtricitabine-tenofovir AF  1 tablet Oral Daily   heparin  5,000 Units Subcutaneous Q8H   hydrochlorothiazide  12.5 mg Oral Daily   insulin aspart  0-15 Units Subcutaneous TID WC   insulin aspart  0-5 Units Subcutaneous QHS   irbesartan  150 mg Oral Daily   levothyroxine  175 mcg Oral Q0600   metoprolol succinate  50 mg Oral Daily   rosuvastatin  10 mg Oral Daily   spironolactone  25 mg Oral Daily   tamoxifen  20 mg  Oral Daily   Continuous Infusions:  [START ON 07/13/2019] vancomycin      Assessment & Plan:    1. Right mastitis with right breast retroareolar abscesses: US soft tissue chest shows retroareolar complex collection measuring 2.3 x 2.1 cm with an adjacent similar appearing collection measuring 1.6 x 1.1 cm.  Patient seen by oncology Dr. [name] and ID Dr. [name], recommended to continue vancomycin for now.  ID recommends drainage of abscesses either by IR or general surgery.  Will place IR consultation request. Per ID recommendation, plan to add Unasyn after abscess drained and sent for cultures  2.  History of right breast cancer: Status post surgery and chemo.  T2 N0, stage IB invasive ductal carcinoma, grade 2, ER/PR positive, HER-2 positive.  Continue tamoxifen.  Appreciate oncology evaluation and input.  3.  HIV: Seen by ID and recommended to continue Descovy and [name]. ID will check her CD4 count and viral load  4.  Hypertension: Resume home meds  5.  Diabetes mellitus: On glipizide at home.  Sliding scale insulin while here.  Blood glucose 110-250.  Continue diabetic diet.  Resume glipizide if persistently hyperglycemic  6.  Hypothyroidism: Synthroid  7.  Chronic hidradenitis right axilla: On daily doxycycline at home   DVT prophylaxis: Heparin-hold in a.m. for anticipated IR procedure Code Status: Full code Family / Patient Communication: Discussed with patient Disposition Plan: Home when medically cleared     LOS: 1 day    Time spent: 35 minutes    [place] Shi, MD [facility] Pager [phone]  If 7PM-7AM, please contact night-coverage [email or web address] Password [facility] 07/12/2019, 11:09 AM

## 2019-07-12 NOTE — Progress Notes (Signed)
[name]   DOB:[birth year]   FU#:[id number]   KGU#:[id number]  Subjective:  [name] "popped a pimple" around her right nipple perhaps 2 weeks ago. Yesterday she called c/o pain, redness and sweling of the right breast and was directed to the ED, where she was found to have R breast cellulitis. UlS shows retroperitoneal abscess. She was already on oral doxycycline and recently completed a short course of cipro for Proteus in her urine. She was started on IV vancomycin and oberved overnight   Objective: middle aged [place] American woman examined in bed Vitals:   07/12/19 0700 07/12/19 0809  BP: 117/74 137/85  Pulse: 70 80  Resp:  18  Temp:    SpO2: 91% 96%    Body mass index is 38.94 kg/m.  Intake/Output Summary (Last 24 hours) at 07/12/2019 6283 Last data filed at 07/11/2019 1508 Gross per 24 hour  Intake 199.77 ml  Output -  Net 199.77 ml     The right breast is s/p lumpectomy and radiation, with residual hyperpigmentation; it is swollen and very tender especially around the nipple area. No discharge.  CBG (last 3)  Recent Labs    07/11/19 1757 07/11/19 2244 07/12/19 0808  GLUCAP 165* 255* 110*     Labs:  Lab Results  Component Value Date   WBC 7.9 07/12/2019   HGB 11.4 (L) 07/12/2019   HCT 35.2 (L) 07/12/2019   MCV 94.1 07/12/2019   PLT 161 07/12/2019   NEUTROABS 5.2 07/11/2019    _0 @  Urine Studies No results for input(s): UHGB, CRYS in the last 72 hours.  Invalid input(s): UACOL, UAPR, USPG, UPH, UTP, UGL, UKET, UBIL, UNIT, UROB, ULEU, UEPI, UWBC, URBC, UBAC, CAST, UCOM, BILUA  Basic Metabolic Panel: Recent Labs  Lab 07/11/19 1400 07/12/19 0532  NA 139 140  K 3.6 3.6  CL 103 105  CO2 26 26  GLUCOSE 238* 121*  BUN 33* 27*  CREATININE 1.79* 1.55*  CALCIUM 8.8* 8.6*   GFR Estimated Creatinine Clearance: 51.9 mL/min (A) (by C-G formula based on SCr of 1.55 mg/dL (H)). Liver Function Tests: Recent Labs  Lab 07/11/19 1400  07/12/19 0532  AST 34 33  ALT 21 21  ALKPHOS 79 81  BILITOT 0.2* 0.4  PROT 7.8 7.7  ALBUMIN 3.0* 2.9*   No results for input(s): LIPASE, AMYLASE in the last 168 hours. No results for input(s): AMMONIA in the last 168 hours. Coagulation profile No results for input(s): INR, PROTIME in the last 168 hours.  CBC: Recent Labs  Lab 07/11/19 1400 07/12/19 0532  WBC 7.9 7.9  NEUTROABS 5.2  --   HGB 11.7* 11.4*  HCT 36.0 35.2*  MCV 93.5 94.1  PLT 158 161   Cardiac Enzymes: No results for input(s): CKTOTAL, CKMB, CKMBINDEX, TROPONINI in the last 168 hours. BNP: Invalid input(s): POCBNP CBG: Recent Labs  Lab 07/11/19 1757 07/11/19 2244 07/12/19 0808  GLUCAP 165* 255* 110*   D-Dimer No results for input(s): DDIMER in the last 72 hours. Hgb A1c No results for input(s): HGBA1C in the last 72 hours. Lipid Profile No results for input(s): CHOL, HDL, LDLCALC, TRIG, CHOLHDL, LDLDIRECT in the last 72 hours. Thyroid function studies Recent Labs    07/11/19 1134  TSH 17.98*   Anemia work up No results for input(s): VITAMINB12, FOLATE, FERRITIN, TIBC, IRON, RETICCTPCT in the last 72 hours. Microbiology Recent Results (from the past 240 hour(s))  SARS CORONAVIRUS 2 (TAT 6-24 HRS) Nasopharyngeal Nasopharyngeal Swab  Status: None   Collection Time: 07/11/19  2:00 PM   Specimen: Nasopharyngeal Swab  Result Value Ref Range Status   SARS Coronavirus 2 NEGATIVE NEGATIVE Final    Comment: (NOTE) SARS-CoV-2 target nucleic acids are NOT DETECTED. The SARS-CoV-2 RNA is generally detectable in upper and lower respiratory specimens during the acute phase of infection. Negative results do not preclude SARS-CoV-2 infection, do not rule out co-infections with other pathogens, and should not be used as the sole basis for treatment or other patient management decisions. Negative results must be combined with clinical observations, patient history, and epidemiological information. The  expected result is Negative. Fact Sheet for Patients: SugarRoll.be Fact Sheet for Healthcare Providers: https://www.woods-mathews.com/ This test is not yet approved or cleared by the [place] FDA and  has been authorized for detection and/or diagnosis of SARS-CoV-2 by FDA under an Emergency Use Authorization (EUA). This EUA will remain  in effect (meaning this test can be used) for the duration of the COVID-19 declaration under Section 56 4(b)(1) of the Act, 21 U.S.C. section 360bbb-3(b)(1), unless the authorization is terminated or revoked sooner. Performed at [facility], [place] [address], [place], [place] [address]       Studies:  Dg Chest Port 1 View  Result Date: 07/11/2019 CLINICAL DATA:  53 year old female with a history of breast pain on the right EXAM: PORTABLE CHEST 1 VIEW COMPARISON:  June 28, 2019 FINDINGS: Cardiomediastinal silhouette unchanged in size and contour. No pneumothorax. No pleural effusion. No confluent airspace disease. Unchanged left chest wall IJ port catheter. IMPRESSION: Negative for acute cardiopulmonary disease. Left chest wall port catheter unchanged Electronically Signed   By: Corrie Mckusick D.O.   On: 07/11/2019 13:33   [place] Chest Soft Tissue  Result Date: 07/11/2019 CLINICAL DATA:  Examination is of the right breast. Patient has nipple discharge and pain. She underwent a right lumpectomy in November 2019. EXAM: ULTRASOUND OF THE RIGHT BREAST COMPARISON:  None. FINDINGS: Targeted ultrasound is performed, showing diffuse edema and hyperechoic fat consistent with inflammation. In the retroareolar region there is a complex collection measuring 2.3 x 2.1 cm with an adjacent similar appearing collection measuring 1.6 x 1.1 cm. These findings are consistent with abscesses. IMPRESSION: Findings consistent with right mastitis with right breast retroareolar abscess/abscesses. RECOMMENDATION: 1. Recommend  beginning antibiotic therapy targeting the typical breast infection causes. Consider doxycycline, Augmentin or clindamycin. 2. Follow-up diagnostic evaluation in the breast Center tomorrow or the first of next week for reassessment and probable abscess aspiration. I have discussed the findings and recommendations with the patient. If applicable, a reminder letter will be sent to the patient regarding the next appointment. BI-RADS CATEGORY  2: Benign. Electronically Signed   By: Lajean Manes M.D.   On: 07/11/2019 19:48    Assessment: 53 y.o. HIV positive [place], [place] woman status post right breast upper inner quadrant biopsy 04/26/2018 for a clinical T2 N0, stage IB invasive ductal carcinoma, grade 2, ER/PR positive, HER-2 positive with an MIB-1 of 70%.  (1) genetics testing 07/03/2018: no pathogenic mutations.Genes Analyzed (43 total): APC*, ATM*, AXIN2, BARD1, BMPR1A, BRCA1*, BRCA2*, BRIP1*, CDH1*, CDK4, CDKN2A, CHEK2*, DICER1,GALNT12, HOXB13, MEN1, MLH1*, MRE11A, MSH2*, MSH3, MSH6*, MUTYH*, NBN, NF1*, NTHL1, PALB2*, PMS2*, POLD1, POLE,PTEN*, RAD50, RAD51C*, RAD51D*, RET, SDHB, SDHD, SMAD4, SMARCA4, STK11 and TP53* (sequencing and deletion/duplication); EGFR (sequencing only); EPCAM and GREM1 (deletion/duplication only). DNA and RNA analyses performed for * genes.  (2) neoadjuvant chemotherapy consisting of carboplatin, docetaxel, trastuzumab and pertuzumab given every 21  days x 6, starting 05/31/2018, completed 09/14/2018             (a) Pertuzumab discontinued starting with cycle 2 because of diarrhea problems             (b) Taxotere stopped after 4 cycles due peripheral neuropathy and substituted for last two cycles with gemcitabine  (3)  definitive surgery on 10/03/2018 showed residual ypT1c ypN0 invasive ductal carcinoma, grade 2, with negative margins; a total of 5 sentinel lymph nodes removed; repeat prognostic panel again triple positive  (4) T-DM1 given adjuvantly due to residual  disease: started 11/13/2018.             (a) echo on 05/16/2018 shows an EF of 60-65%.             (b) echo on 08/27/18 shows an EF 55-60%             (c) echo on 12/03/2018 shows an EF of 60-65%             (d) echocardiogram on 03/06/2019 showed an ejection fraction of 55% as read by Dr. Haroldine Laws.             (e) echocardiogram 06/20/2019 shows an ejection fraction in the 55-60% range  (5) adjuvant radiation completed 01/07/2019 - 02/21/2019 (delayed due to wound healing issues)  Site/dose:The patient initially received a dose of 50.4 Gy in 28 fractions to the rightbreast using whole-breast tangent fields. This was delivered using a 3-D conformal technique. The patient then received a boost to the seroma. This delivered an additional 10 Gy in 5 fractions using 12e electrons with a special teletherapy technique. The total dose was 60.4 Gy.  (6) Tamoxifen started 02/28/2019  (7) hidradenitis/abscesses: on doxycycline daily  (8) right breast cellulitis/ abscess  Plan:  Shaneece has been off chemo for almost a year and she has normal WBC/ANC. Her anti-HER2 immune therapy does not cause significant immune compromise. 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 She now presents with R breast cellulitis and US shows 2 retroareolar fluid collections c/w abscesses. She was started on vancomycin yesterday  She had Proteus in her urine documented 06/14/2019 treated with 5 days of cipro. She is on suppressive doxycycline for hidradenitis.  I would not be uncomfortable discharging patient on oral gram positive coverage with referral to the [facility] for abscess aspiration. It may be a good idea to curbside ID and I will copy Dr [name] on this note.   Monica returns to see [place] 10/05 at the cancer center and we will follow up at that time    Chauncey Cruel, MD 07/12/2019  8:28 AM Medical Oncology and Hematology [facility] [place], [place] [address] Tel. [phone]    Fax. [phone]

## 2019-07-13 DIAGNOSIS — N183 Chronic kidney disease, stage 3 (moderate): Secondary | ICD-10-CM

## 2019-07-13 DIAGNOSIS — Z170 Estrogen receptor positive status [ER+]: Secondary | ICD-10-CM

## 2019-07-13 DIAGNOSIS — C50311 Malignant neoplasm of lower-inner quadrant of right female breast: Secondary | ICD-10-CM

## 2019-07-13 DIAGNOSIS — B20 Human immunodeficiency virus [HIV] disease: Secondary | ICD-10-CM

## 2019-07-13 DIAGNOSIS — L732 Hidradenitis suppurativa: Secondary | ICD-10-CM

## 2019-07-13 DIAGNOSIS — N611 Abscess of the breast and nipple: Principal | ICD-10-CM

## 2019-07-13 DIAGNOSIS — Z792 Long term (current) use of antibiotics: Secondary | ICD-10-CM

## 2019-07-13 LAB — CREATININE, SERUM
Creatinine, Ser: 1.56 mg/dL — ABNORMAL HIGH (ref 0.44–1.00)
GFR calc Af Amer: 44 mL/min — ABNORMAL LOW (ref >60)
GFR calc non Af Amer: 38 mL/min — ABNORMAL LOW (ref >60)

## 2019-07-13 LAB — GLUCOSE, CAPILLARY
Glucose-Capillary: 137 mg/dL — ABNORMAL HIGH (ref 70–99)
Glucose-Capillary: 158 mg/dL — ABNORMAL HIGH (ref 70–99)
Glucose-Capillary: 154 mg/dL — ABNORMAL HIGH (ref 70–99)
Glucose-Capillary: 152 mg/dL — ABNORMAL HIGH (ref 70–99)

## 2019-07-13 LAB — BASIC METABOLIC PANEL
Anion gap: 9 (ref 5–15)
BUN: 23 mg/dL — ABNORMAL HIGH (ref 6–20)
CO2: 25 mmol/L (ref 22–32)
Calcium: 8.4 mg/dL — ABNORMAL LOW (ref 8.9–10.3)
Chloride: 105 mmol/L (ref 98–111)
Creatinine, Ser: 1.54 mg/dL — ABNORMAL HIGH (ref 0.44–1.00)
GFR calc Af Amer: 45 mL/min — ABNORMAL LOW (ref >60)
GFR calc non Af Amer: 39 mL/min — ABNORMAL LOW (ref >60)
Glucose, Bld: 132 mg/dL — ABNORMAL HIGH (ref 70–99)
Potassium: 3.4 mmol/L — ABNORMAL LOW (ref 3.5–5.1)
Sodium: 139 mmol/L (ref 135–145)

## 2019-07-13 MED ORDER — SODIUM CHLORIDE 0.9% FLUSH
10.0000 mL | Freq: Two times a day (BID) | INTRAVENOUS | Status: DC
  Administered 2019-07-13 – 2019-07-14 (×2): 10 mL

## 2019-07-13 MED ORDER — SODIUM CHLORIDE 0.9% FLUSH: 10.0000 mL | INTRAVENOUS | Status: DC | PRN

## 2019-07-13 MED ORDER — VANCOMYCIN HCL IN DEXTROSE 1-5 GM/200ML-% IV SOLN
1000.0000 mg | INTRAVENOUS | Status: DC
  Administered 2019-07-14 – 2019-07-15 (×2): 1000 mg via INTRAVENOUS
  Filled 2019-07-13 (×2): qty 200

## 2019-07-13 NOTE — Progress Notes (Signed)
PROGRESS NOTE    [name]  WNU:[id number]  DOB: 1966/06/17  DOA: 07/11/2019 PCP: [name], NP  Brief Narrative:   53 y.o. female with medical history significant of Breast cancer s/p chemo and followed by Oncology, DM, HIV, HTN presents to ED with complaints of worsening R breast pain, tenderness, and swelling after she tried to pop a pimple/blackhead 2 week PTA.Area then became more inflamed with increased pain,swelling and she noted dark colored drainage She also reported subjective fevers, chills, sob. Pt already on oral daily doxycyline for tx of hidradenitis. Recently completed 5 day course of oral cipro for UTI (8/28) ED Course: In the ED, pt noted to be afebrile with no leukocytosis. CXR was clear. Cr of 1.79. Given failure of outpatient doxycycline, pt was started on empiric vancomycin and hospitalist consulted for consideration for admission. Patient evaluated by Dr [name] this morning and agreed with inpatient IV abx therapy.   Subjective:  Denies any acute complaint no nausea no vomiting no fever no chills no chest pain abdominal pain.   Objective: Vitals:   07/13/19 0636 07/13/19 1347 07/13/19 1348 07/13/19 2043  BP: (!) 153/80 118/64  (!) 156/85  Pulse: 66 73  92  Resp: 18   16  Temp: 98.3 F (36.8 C)  98 F (36.7 C) 98.4 F (36.9 C)  TempSrc: Oral  Oral Oral  SpO2: 91% 95%  95%  Weight:      Height:        Intake/Output Summary (Last 24 hours) at 07/13/2019 2051 Last data filed at 07/13/2019 1700 Gross per 24 hour  Intake 1043.62 ml  Output -  Net 1043.62 ml   Filed Weights   07/12/19 0532  Weight: 106.1 kg    Physical Examination:  General exam: Appears calm and comfortable  Respiratory system: Clear to auscultation. Respiratory effort normal. Cardiovascular system: S1 & S2 heard, RRR. No JVD, murmurs, rubs, gallops or clicks. No pedal edema. Gastrointestinal system: Abdomen is nondistended, soft and nontender. No organomegaly  or masses felt. Normal bowel sounds heard. Central nervous system: Alert and oriented. No focal neurological deficits. Extremities: Symmetric 5 x 5 power. Skin: Right-sided retroareolar induration with adjacent chronic superficial abrasion/ulcerations.  No active drainage Psychiatry: Judgement and insight appear normal. Mood & affect appropriate.     Data Reviewed: I have personally reviewed following labs and imaging studies  CBC: Recent Labs  Lab 07/11/19 1400 07/12/19 0532  WBC 7.9 7.9  NEUTROABS 5.2  --   HGB 11.7* 11.4*  HCT 36.0 35.2*  MCV 93.5 94.1  PLT 158 034   Basic Metabolic Panel: Recent Labs  Lab 07/11/19 1400 07/12/19 0532 07/13/19 0500  NA 139 140 139  K 3.6 3.6 3.4*  CL 103 105 105  CO2 _0 GLUCOSE 238* 121* 132*  BUN 33* 27* 23*  CREATININE 1.79* 1.55* 1.54*  1.56*  CALCIUM 8.8* 8.6* 8.4*   GFR: Estimated Creatinine Clearance: 51.6 mL/min (A) (by C-G formula based on SCr of 1.56 mg/dL (H)). Liver Function Tests: Recent Labs  Lab 07/11/19 1400 07/12/19 0532  AST 34 33  ALT 21 21  ALKPHOS 79 81  BILITOT 0.2* 0.4  PROT 7.8 7.7  ALBUMIN 3.0* 2.9*   No results for input(s): LIPASE, AMYLASE in the last 168 hours. No results for input(s): AMMONIA in the last 168 hours. Coagulation Profile: No results for input(s): INR, PROTIME in the last 168 hours. Cardiac Enzymes: No results for input(s): CKTOTAL, CKMB, CKMBINDEX,  TROPONINI in the last 168 hours. BNP (last 3 results) No results for input(s): PROBNP in the last 8760 hours. HbA1C: No results for input(s): HGBA1C in the last 72 hours. CBG: Recent Labs  Lab 07/12/19 2131 07/13/19 0808 07/13/19 1202 07/13/19 1657 07/13/19 2046  GLUCAP 164* 137* 158* 154* 152*   Lipid Profile: No results for input(s): CHOL, HDL, LDLCALC, TRIG, CHOLHDL, LDLDIRECT in the last 72 hours. Thyroid Function Tests: Recent Labs    07/11/19 1134  TSH 17.98*   Anemia Panel: No results for input(s):  VITAMINB12, FOLATE, FERRITIN, TIBC, IRON, RETICCTPCT in the last 72 hours. Sepsis Labs: No results for input(s): PROCALCITON, LATICACIDVEN in the last 168 hours.  Recent Results (from the past 240 hour(s))  SARS CORONAVIRUS 2 (TAT 6-24 HRS) Nasopharyngeal Nasopharyngeal Swab     Status: None   Collection Time: 07/11/19  2:00 PM   Specimen: Nasopharyngeal Swab  Result Value Ref Range Status   SARS Coronavirus 2 NEGATIVE NEGATIVE Final    Comment: (NOTE) SARS-CoV-2 target nucleic acids are NOT DETECTED. The SARS-CoV-2 RNA is generally detectable in upper and lower respiratory specimens during the acute phase of infection. Negative results do not preclude SARS-CoV-2 infection, do not rule out co-infections with other pathogens, and should not be used as the sole basis for treatment or other patient management decisions. Negative results must be combined with clinical observations, patient history, and epidemiological information. The expected result is Negative. Fact Sheet for Patients: SugarRoll.be Fact Sheet for Healthcare Providers: https://www.woods-mathews.com/ This test is not yet approved or cleared by the [place] FDA and  has been authorized for detection and/or diagnosis of SARS-CoV-2 by FDA under an Emergency Use Authorization (EUA). This EUA will remain  in effect (meaning this test can be used) for the duration of the COVID-19 declaration under Section 56 4(b)(1) of the Act, 21 U.S.C. section 360bbb-3(b)(1), unless the authorization is terminated or revoked sooner. Performed at [name], [place] [address] [place], [place] [address]       Radiology Studies: No results found.      Scheduled Meds: . amLODipine  10 mg Oral Daily  . Chlorhexidine Gluconate Cloth  6 each Topical Daily  . dolutegravir  50 mg Oral Daily  . emtricitabine-tenofovir AF  1 tablet Oral Daily  . heparin  5,000 Units Subcutaneous Q8H   . hydrochlorothiazide  12.5 mg Oral Daily  . insulin aspart  0-15 Units Subcutaneous TID WC  . insulin aspart  0-5 Units Subcutaneous QHS  . irbesartan  150 mg Oral Daily  . levothyroxine  175 mcg Oral Q0600  . metoprolol succinate  50 mg Oral Daily  . rosuvastatin  10 mg Oral Daily  . sodium chloride flush  10-40 mL Intracatheter Q12H  . spironolactone  25 mg Oral Daily  . tamoxifen  20 mg Oral Daily   Continuous Infusions: . [START ON 07/14/2019] vancomycin      Assessment & Plan:    1. Right mastitis with right breast retroareolar abscesses: US soft tissue chest shows retroareolar complex collection measuring 2.3 x 2.1 cm with an adjacent similar appearing collection measuring 1.6 x 1.1 cm.  Patient seen by oncology Dr. [name] and ID Dr. [name], recommended to continue vancomycin for now.  ID recommends drainage of abscesses either by IR or general surgery..  IR currently recommending close observation with repeat ultrasound evaluation prior to drainage.  2.  History of right breast cancer: Status post surgery and chemo.  T2 N0,  stage IB invasive ductal carcinoma, grade 2, ER/PR positive, HER-2 positive.  Continue tamoxifen.  Appreciate oncology evaluation and input.  3.  HIV: Seen by ID and recommended to continue [place] and [name]. ID will check her CD4 count and viral load  4.  Hypertension: Resume home meds  5.  Diabetes mellitus: On glipizide at home.  Sliding scale insulin while here.  Blood glucose 110-250.  Continue diabetic diet.  Resume glipizide if persistently hyperglycemic  6.  Hypothyroidism: Synthroid  7.  Chronic hidradenitis right axilla: On daily doxycycline at home   DVT prophylaxis: Heparin-hold in a.m. for anticipated IR procedure Code Status: Full code Family / Patient Communication: Discussed with patient Disposition Plan: Home when medically cleared     LOS: 2 days    Time spent: 35 minutes    Berle Mull, MD [facility]  If  7PM-7AM, please contact night-coverage [email or web address] Password [facility] 07/13/2019, 8:51 PM

## 2019-07-13 NOTE — Consult Note (Signed)
Chief Complaint: Patient was seen in consultation today for right breast abscess/aspiration.  Referring Physician(s): Coralie Keens  Supervising Physician: Aletta Edouard  Patient Status: [facility] - In-pt  History of Present Illness: [name] is a 53 y.o. female with a past medical history of hypertension, hyperlipidemia, asthma, bronchitis, sarcoidosis, grave's disease, right breast cancer, HIV, diabetes mellitus, and anxiety. She was unfortunately diagnosed with right breast cancer in 04/2018. Her cancer is managed by Dr. [name]. She has undergone systemic chemotherapy, right breast lumpectomy and sentinel node biopsy, and radiation therapy. She presented to [facility] ED 07/11/2019 with complaint of right breast pain and was found to have an abscess in that area. She was admitted for further management. CCS was consulted who states area is recently radiated, and recommends IR consultation for possible aspiration of that area.  IR consulted by Dr. Ninfa Linden for possible image-guided aspiration of right breast abscess. Patient awake and alert sitting in bed. Complains of right breast pain, rated 5/10 at this time. Denies fever, chills, chest pain, dyspnea, abdominal pain, or headache.  Currently on SQ Heparin.   Past Medical History:  Diagnosis Date  . Allergy   . Anemia    Normocytic  . Anxiety   . Asthma   . Blood dyscrasia   . Bronchitis 2005  . 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 Diabetes mellitus without complication ([place])   . Family history of breast cancer   . Family history of lung cancer   . Family history of prostate cancer   . Gastroenteritis 07/10/07  . GERD 07/24/2006  . GRAVE'S DISEASE 01/01/2008  . History of hidradenitis suppurativa   . History of kidney stones   . History of thrush   . HIV DISEASE 07/24/2006   dx March 05  . Hyperlipidemia   . HYPERTENSION 07/24/2006  . Hyperthyroidism 08/2006   Grave's Disease -diffuse radiotracer uptake  08/25/06 Thyroid scan-Cold nodule to R lower lobe of thyrorid  . Menometrorrhagia    hx of  . Nephrolithiasis   . 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 Personal history of chemotherapy    2020  . Personal history of radiation therapy    2020  . Pneumonia 2005  . 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 Sarcoidosis 02/08/2007   dx as a teenager in [place] from abnl CXR. Completed 2 yrs Prednisone after lung bx confirmation. No symptoms since then.  . Suppurative hidradenitis   . Thyroid cancer ([place])   . THYROID NODULE, RIGHT 02/08/2007    Past Surgical History:  Procedure Laterality Date  . BREAST EXCISIONAL BIOPSY Right 04/26/2018   right axilla negative  . BREAST EXCISIONAL BIOPSY Left 04/26/2018   left axilla negative  . BREAST LUMPECTOMY Right 10/03/2018   malignant  . BREAST LUMPECTOMY WITH RADIOACTIVE SEED AND SENTINEL LYMPH NODE BIOPSY Right 10/03/2018   Procedure: RIGHT BREAST LUMPECTOMY WITH RADIOACTIVE SEED AND SENTINEL LYMPH NODE MAPPING;  Surgeon: Erroll Luna, MD;  Location: [place];  Service: General;  Laterality: Right;  . BREAST SURGERY  1997   Breast Reduction   . CYSTOSCOPY W/ URETERAL STENT REMOVAL  11/09/2012   Procedure: CYSTOSCOPY WITH STENT REMOVAL;  Surgeon: Alexis Frock, MD;  Location: [facility];  Service: Urology;  Laterality: Right;  . CYSTOSCOPY WITH RETROGRADE PYELOGRAM, URETEROSCOPY AND STENT PLACEMENT  11/09/2012   Procedure: CYSTOSCOPY WITH RETROGRADE PYELOGRAM, URETEROSCOPY AND STENT PLACEMENT;  Surgeon: Alexis Frock, MD;  Location: [facility];  Service: Urology;  Laterality:  Left;  LEFT URETEROSCOPY, STONE MANIPULATION, left STENT exchange   . CYSTOSCOPY WITH STENT PLACEMENT  10/02/2012   Procedure: CYSTOSCOPY WITH STENT PLACEMENT;  Surgeon: Alexis Frock, MD;  Location[facility];  Service: Urology;  Laterality: Left;  . DILATION AND CURETTAGE OF UTERUS  Feb 2004   s/p for 1st trimester  nonviable pregnancy  . EYE SURGERY     sty under eyelid  . INCISE AND DRAIN ABCESS  Nov 03   s/p I &D for righ inframmary fold hidradenitis  . INCISION AND DRAINAGE PERITONSILLAR ABSCESS  Mar 03  . IR CV LINE INJECTION  06/07/2018  . IR IMAGING GUIDED PORT INSERTION  06/20/2018  . IR REMOVAL TUN ACCESS W/ PORT W/O FL MOD SED  06/20/2018  . IRRIGATION AND DEBRIDEMENT ABSCESS  01/31/2012   Procedure: IRRIGATION AND DEBRIDEMENT ABSCESS;  Surgeon: Shann Medal, MD;  [facility];  Service: General;  Laterality: Right;  right breast and axilla   . NEPHROLITHOTOMY  10/02/2012   Procedure: NEPHROLITHOTOMY PERCUTANEOUS;  Surgeon: Alexis Frock, MD;  [facility];  Service: Urology;  Laterality: Right;  First Stage Percutaneous Nephrolithotomy with Surgeon Access, Left Ureteral Stent    . NEPHROLITHOTOMY  10/04/2012   Procedure: NEPHROLITHOTOMY PERCUTANEOUS SECOND LOOK;  Surgeon: Alexis Frock, MD;  [facility];  Service: Urology;  Laterality: Right;     . NEPHROLITHOTOMY  10/08/2012   Procedure: NEPHROLITHOTOMY PERCUTANEOUS;  Surgeon: Alexis Frock, MD;  [facility];  Service: Urology;  Laterality: Right;  THIRD STAGE, nephrostomy tube exchange x 2  . NEPHROLITHOTOMY  10/11/2012   Procedure: NEPHROLITHOTOMY PERCUTANEOUS SECOND LOOK;  Surgeon: Alexis Frock, MD;  [facility];  Service: Urology;  Laterality: Right;  RIGHT 4 STAGE PERCUTANOUS NEPHROLITHOTOMY, right URETEROSCOPY WITH HOLMIUM LASER   . PORTACATH PLACEMENT Left 05/17/2018   Procedure: INSERTION PORT-A-CATH;  Surgeon: Coralie Keens, MD;  Location: [place];  Service: General;  Laterality: Left;  . RADICAL NECK DISSECTION  04/12/2017   limited/notes 04/12/2017  . RADICAL NECK DISSECTION N/A 04/12/2017   Procedure: RADICAL NECK DISSECTION;  Surgeon: Melida Quitter, MD;  Location: [place];  Service: ENT;  Laterality: N/A;  limited neck dissection 2 hours total  . REDUCTION MAMMAPLASTY Bilateral 1998  .  [place]  1994  . THYROIDECTOMY  04/12/2017   completion/notes 04/12/2017  . THYROIDECTOMY N/A 04/12/2017   Procedure: THYROIDECTOMY;  Surgeon: Melida Quitter, MD;  Location: [place];  Service: ENT;  Laterality: N/A;  Completion Thyroidectomy  . TOTAL THYROIDECTOMY  2010    Allergies: Genvoya [elviteg-cobic-emtricit-tenofaf], Lisinopril, and Valsartan  Medications: Prior to Admission medications   Medication Sig Start Date End Date Taking? Authorizing Provider  amLODipine (NORVASC) 10 MG tablet TAKE 1 TABLET BY MOUTH EVERY DAY FOR BLOOD PRESSURE Patient taking differently: Take 10 mg by mouth daily.  06/14/19  Yes [name], NP  cephALEXin (KEFLEX) 500 MG capsule Take 1 capsule (500 mg total) by mouth 3 (three) times daily. 05/19/19  Yes Delo, Nathaneil Canary, MD  ciprofloxacin (CIPRO) 500 MG tablet Take 1 tablet (500 mg total) by mouth 2 (two) times daily. 06/17/19  Yes Causey, Charlestine Massed, NP  dapagliflozin propanediol (FARXIGA) 10 MG TABS tablet Take 10 mg by mouth daily before breakfast. 06/28/19  Yes Bensimhon, Shaune Pascal, MD  doxycycline (VIBRA-TABS) 100 MG tablet 100 mg daily.  10/29/18  Yes [provider]  emtricitabine-tenofovir AF (DESCOVY) 200-25 MG tablet Take 1 tablet by mouth daily. 06/28/19  Yes Michel Bickers,  MD  fluticasone (FLOVENT HFA) 110 MCG/ACT inhaler Inhale 1 puff into the lungs 2 (two) times daily. 06/05/19  Yes [name], NP  glipiZIDE (GLUCOTROL) 5 MG tablet Take 1 tablet (5 mg total) by mouth 2 (two) times daily before a meal. For diabetes. 06/06/19  Yes [name], NP  irbesartan-hydrochlorothiazide (AVALIDE) 150-12.5 MG tablet TAKE 1 TABLET BY MOUTH ONCE DAILY FOR BLOOD PRESSURE. Patient taking differently: Take 1 tablet by mouth daily.  12/25/18  Yes [name], NP  levothyroxine (SYNTHROID) 175 MCG tablet TAKE 1 TABLET BY MOUTH EVERY MORNING ON AN EMPTY STOMACH WITH A FULL GLASS OF WATER. Patient taking differently: Take 175 mcg by mouth  daily before breakfast. TAKE  ON AN EMPTY STOMACH WITH A FULL GLASS OF WATER. 06/18/19  Yes [name], NP  metoprolol succinate (TOPROL-XL) 50 MG 24 hr tablet TAKE 1 TABLET (50 MG TOTAL) BY MOUTH DAILY. TAKE WITH OR IMMEDIATELY FOLLOWING A MEAL. 06/18/19  Yes [name], NP  Multiple Vitamin (MULTIVITAMIN) tablet Take 1 tablet by mouth daily.   Yes [provider]  rosuvastatin (CRESTOR) 10 MG tablet Take 1 tablet (10 mg total) by mouth daily. For cholesterol. 06/10/19  Yes [name], NP  spironolactone (ALDACTONE) 25 MG tablet Take 1 tablet (25 mg total) by mouth daily. 06/28/19 09/26/19 Yes Bensimhon, Shaune Pascal, MD  tamoxifen (NOLVADEX) 20 MG tablet Take 1 tablet (20 mg total) by mouth daily. 02/28/19  Yes Causey, Charlestine Massed, NP  [name] 50 MG tablet TAKE 1 TABLET BY MOUTH EVERY DAY Patient taking differently: Take 50 mg by mouth daily.  06/28/19  Yes Michel Bickers, MD  fluconazole (DIFLUCAN) 100 MG tablet 2 tablets for first dose then 1 tab a day x 2 days Patient not taking: Reported on 07/11/2019 06/14/19   Gardenia Phlegm, NP  [employer] LANCETS MISC Use as instructed to test blood sugar daily 06/08/17   [name], NP  [facility] VERIO test strip USE AS INSTRUCTED TO TEST BLOOD SUGAR DAILY NEED APPOINTMENT FOR ANY MORE REFILLS Patient taking differently: 1 each by Other route as needed (Blood Sugar).  08/21/18   [name], NP  potassium chloride (K-DUR) 10 MEQ tablet TAKE 2 TABLETS (20 MEQ TOTAL) BY MOUTH 2 (TWO) TIMES DAILY. Patient not taking: Reported on 07/11/2019 05/20/19   Magrinat, Virgie Dad, MD  traMADol (ULTRAM) 50 MG tablet Take 1 tablet (50 mg total) by mouth every 6 (six) hours as needed. Patient not taking: Reported on 07/11/2019 05/20/19   Magrinat, Virgie Dad, MD  prochlorperazine (COMPAZINE) 10 MG tablet TAKE 1 TABLET BY MOUTH EVERY 6 HOURS AS NEEDED FOR NAUSEA OR VOMITING 08/24/18 12/27/18  Magrinat, Virgie Dad, MD     Family History   Problem Relation Age of Onset  . Hypertension Mother   . Cancer Mother        laryngeal  . Heart disease Mother        stent  . Hypertension Father   . Lung cancer Father 36       hx smoking  . Heart disease Other   . Hypertension Other   . Stroke Other        Grandparent  . Kidney disease Other        Grandparent  . Diabetes Other        FH of Diabetes  . Hypertension Sister   . Cancer Maternal Uncle        Lung  CA  . Hypertension Brother   . Hypertension Sister   . Breast cancer Maternal Aunt 65  . Breast cancer Paternal Aunt 12  . Prostate cancer Paternal Uncle   . Breast cancer Maternal Aunt        dx 60+  . Breast cancer Paternal Aunt        dx 82's  . Breast cancer Paternal Aunt        dx 21's  . Prostate cancer Paternal Uncle   . Lung cancer Paternal Uncle   . Breast cancer Cousin 60  . Breast cancer Cousin        dx <50  . Breast cancer Cousin        dx <50  . Breast cancer Cousin        dx <50  . Colon cancer Neg Hx   . Esophageal cancer Neg Hx   . Rectal cancer Neg Hx   . Stomach cancer Neg Hx     Social History   Socioeconomic History  . Marital status: Single    Spouse name: Not on file  . Number of children: 1  . Years of education: Not on file  . Highest education level: Not on file  Occupational History  . Occupation: Secondary school teacher  Social Needs  . Financial resource strain: Not on file  . Food insecurity    Worry: Not on file    Inability: Not on file  . Transportation needs    Medical: No    Non-medical: No  Tobacco Use  . Smoking status: Former Smoker    Packs/day: 0.50    Years: 15.00    Pack years: 7.50    Types: Cigarettes    Start date: 04/12/2017  . Smokeless tobacco: Never Used  . Tobacco comment: quit smoking  may 2018  Substance and Sexual Activity  . Alcohol use: Yes    Alcohol/week: 0.0 standard drinks    Comment: social  . Drug use: No  . Sexual activity: Not Currently    Birth control/protection:  Post-menopausal    Comment: declined condoms  Lifestyle  . Physical activity    Days per week: Not on file    Minutes per session: Not on file  . Stress: Not on file  Relationships  . Social Herbalist on phone: Not on file    Gets together: Not on file    Attends religious service: Not on file    Active member of club or organization: Not on file    Attends meetings of clubs or organizations: Not on file    Relationship status: Not on file  Other Topics Concern  . Not on file  Social History Narrative  . Not on file     Review of Systems: A 12 point ROS discussed and pertinent positives are indicated in the HPI above.  All other systems are negative.  Review of Systems  Constitutional: Negative for chills and fever.  Respiratory: Negative for shortness of breath and wheezing.   Cardiovascular: Negative for chest pain and palpitations.  Gastrointestinal: Negative for abdominal pain.  Musculoskeletal:       Positive for right breast pain.  Neurological: Negative for headaches.  Psychiatric/Behavioral: Negative for behavioral problems and confusion.    Vital Signs: BP 118/64   Pulse 73   Temp 98 F (36.7 C) (Oral)   Resp 18   Ht 5\' 5"  (1.651 m)   Wt 234 lb (106.1 kg)   LMP 03/31/2014  Comment: Negative Serum HCG 06/07/17  SpO2 95%   BMI 38.94 kg/m   Physical Exam Vitals signs and nursing note reviewed.  Constitutional:      General: She is not in acute distress.    Appearance: Normal appearance.  Cardiovascular:     Rate and Rhythm: Normal rate and regular rhythm.     Heart sounds: Normal heart sounds. No murmur.  Pulmonary:     Effort: Pulmonary effort is normal. No respiratory distress.     Breath sounds: Normal breath sounds. No wheezing.  Musculoskeletal:     Comments: Right breast with cellulitis and tenderness, there are 2 well-healed incisions at the medial-inferior aspect of breast, there is some excoriation of the nipple.  Skin:    General:  Skin is warm and dry.  Neurological:     Mental Status: She is alert and oriented to person, place, and time.  Psychiatric:        Mood and Affect: Mood normal.        Behavior: Behavior normal.        Thought Content: Thought content normal.        Judgment: Judgment normal.      MD Evaluation Airway: WNL Heart: WNL Abdomen: WNL Chest/ Lungs: WNL ASA  Classification: 3 Mallampati/Airway Score: Two   Imaging: Dg Chest 2 View  Result Date: 06/28/2019 CLINICAL DATA:  Dyspnea EXAM: CHEST - 2 VIEW COMPARISON:  05/24/2019, CT chest, 09/27/2018 FINDINGS: The heart size and mediastinal contours are within normal limits. Left chest port catheter. Both lungs are clear. Unchanged, subtle sclerotic focus of the right fifth rib, in keeping with lesion as seen on prior CT. IMPRESSION: No acute abnormality of the lungs. Electronically Signed   By: Eddie Candle M.D.   On: 06/28/2019 16:01   Dg Chest Port 1 View  Result Date: 07/11/2019 CLINICAL DATA:  53 year old female with a history of breast pain on the right EXAM: PORTABLE CHEST 1 VIEW COMPARISON:  June 28, 2019 FINDINGS: Cardiomediastinal silhouette unchanged in size and contour. No pneumothorax. No pleural effusion. No confluent airspace disease. Unchanged left chest wall IJ port catheter. IMPRESSION: Negative for acute cardiopulmonary disease. Left chest wall port catheter unchanged Electronically Signed   By: Corrie Mckusick D.O.   On: 07/11/2019 13:33   [place] Chest Soft Tissue  Result Date: 07/11/2019 CLINICAL DATA:  Examination is of the right breast. Patient has nipple discharge and pain. She underwent a right lumpectomy in November 2019. EXAM: ULTRASOUND OF THE RIGHT BREAST COMPARISON:  None. FINDINGS: Targeted ultrasound is performed, showing diffuse edema and hyperechoic fat consistent with inflammation. In the retroareolar region there is a complex collection measuring 2.3 x 2.1 cm with an adjacent similar appearing collection  measuring 1.6 x 1.1 cm. These findings are consistent with abscesses. IMPRESSION: Findings consistent with right mastitis with right breast retroareolar abscess/abscesses. RECOMMENDATION: 1. Recommend beginning antibiotic therapy targeting the typical breast infection causes. Consider doxycycline, Augmentin or clindamycin. 2. Follow-up diagnostic evaluation in the breast Center tomorrow or the first of next week for reassessment and probable abscess aspiration. I have discussed the findings and recommendations with the patient. If applicable, a reminder letter will be sent to the patient regarding the next appointment. BI-RADS CATEGORY  2: Benign. Electronically Signed   By: Lajean Manes M.D.   On: 07/11/2019 19:48    Labs:  CBC: Recent Labs    06/14/19 0925 07/02/19 0758 07/11/19 1400 07/12/19 0532  WBC 7.4 6.8 7.9 7.9  HGB  12.5 12.4 11.7* 11.4*  HCT 37.6 37.1 36.0 35.2*  PLT 193 230 158 161    COAGS: No results for input(s): INR, APTT in the last 8760 hours.  BMP: Recent Labs    07/03/19 1148 07/11/19 1400 07/12/19 0532 07/13/19 0500  NA 137 139 140 139  K 3.8 3.6 3.6 3.4*  CL 102 103 105 105  CO2 25 26 26 25   GLUCOSE 170* 238* 121* 132*  BUN 21* 33* 27* 23*  CALCIUM 8.9 8.8* 8.6* 8.4*  CREATININE 1.42* 1.79* 1.55* 1.54*  1.56*  GFRNONAA 43* 32* 38* 39*  38*  GFRAA 49* 37* 44* 45*  44*    LIVER FUNCTION TESTS: Recent Labs    06/14/19 0925 07/02/19 0758 07/11/19 1400 07/12/19 0532  BILITOT 0.2* <0.2* 0.2* 0.4  AST 44* 23 34 33  ALT 30 17 21 21   ALKPHOS 84 80 79 81  PROT 7.7 7.6 7.8 7.7  ALBUMIN 2.9* 3.0* 3.0* 2.9*     Assessment and Plan:  Patient with history of right breast cancer s/p systemic chemotherapy, right breast lumpectomy and sentinel node biopsy, and radiation therapy; now with development of right breast abscess. Plan for image-guided aspiration of right breast abscess in IR tentatively for tomorrow 07/14/2019 with Dr. Kathlene Cote. Patient  will be NPO at midnight. Afebrile and WBCs WNL. Will hold Heparin per IR protocol. INR pending for 0500 tomorrow AM.  Risks and benefits discussed with the patient including bleeding, infection, damage to adjacent structures, and sepsis. All of the patient's questions were answered, patient is agreeable to proceed. Consent signed and in chart.   Thank you for this interesting consult.  I greatly enjoyed meeting [name] and look forward to participating in their care.  A copy of this report was sent to the requesting provider on this date.  Electronically Signed: Earley Abide, PA-C 07/13/2019, 3:39 PM   I spent a total of 40 Minutes in face to face in clinical consultation, greater than 50% of which was counseling/coordinating care for right breast abscess/aspiration.

## 2019-07-13 NOTE — Progress Notes (Signed)
IR requested by Dr. Earnest Conroy for possible image-guided aspiration/drain placement of breast abscess.  Discussed case with Dr. Kathlene Cote who states per protocol, suspected breast abscesses in inpatients must be seen by general surgery for initial evaluation. Will delete order. Dr. Posey Pronto made aware.  IR available in future if needed.   Bea Graff Cyndy Braver, PA-C 07/13/2019, 8:22 AM

## 2019-07-13 NOTE — Progress Notes (Signed)
Subjective: No new complaints   Antibiotics:  Anti-infectives (From admission, onward)   Start     Dose/Rate Route Frequency Ordered Stop   07/13/19 0600  vancomycin (VANCOCIN) 1,250 mg in sodium chloride 0.9 % 250 mL IVPB     1,250 mg 166.7 mL/hr over 90 Minutes Intravenous Every 36 hours 07/11/19 1753     07/11/19 1830  emtricitabine-tenofovir AF (DESCOVY) 200-25 MG per tablet 1 tablet     1 tablet Oral Daily 07/11/19 1738     07/11/19 1830  dolutegravir ([name]) tablet 50 mg     50 mg Oral Daily 07/11/19 1738     07/11/19 1800  vancomycin (VANCOCIN) 1,250 mg in sodium chloride 0.9 % 250 mL IVPB     1,250 mg 166.7 mL/hr over 90 Minutes Intravenous STAT 07/11/19 1751 07/11/19 2241   07/11/19 1745  vancomycin (VANCOCIN) IVPB 1000 mg/200 mL premix  Status:  Discontinued     1,000 mg 200 mL/hr over 60 Minutes Intravenous  Once 07/11/19 1735 07/11/19 1747   07/11/19 1330  vancomycin (VANCOCIN) IVPB 1000 mg/200 mL premix     1,000 mg 200 mL/hr over 60 Minutes Intravenous  Once 07/11/19 1317 07/11/19 1508      Medications: Scheduled Meds:  amLODipine  10 mg Oral Daily   Chlorhexidine Gluconate Cloth  6 each Topical Daily   dolutegravir  50 mg Oral Daily   emtricitabine-tenofovir AF  1 tablet Oral Daily   heparin  5,000 Units Subcutaneous Q8H   hydrochlorothiazide  12.5 mg Oral Daily   insulin aspart  0-15 Units Subcutaneous TID WC   insulin aspart  0-5 Units Subcutaneous QHS   irbesartan  150 mg Oral Daily   levothyroxine  175 mcg Oral Q0600   metoprolol succinate  50 mg Oral Daily   rosuvastatin  10 mg Oral Daily   sodium chloride flush  10-40 mL Intracatheter Q12H   spironolactone  25 mg Oral Daily   tamoxifen  20 mg Oral Daily   Continuous Infusions:  vancomycin 1,250 mg (07/13/19 0528)   PRN Meds:.acetaminophen **OR** acetaminophen, morphine injection, sodium chloride flush    Objective: Weight change:   Intake/Output Summary (Last  24 hours) at 07/13/2019 1059 Last data filed at 07/13/2019 0600 Gross per 24 hour  Intake 324.45 ml  Output --  Net 324.45 ml   Blood pressure (!) 153/80, pulse 66, temperature 98.3 F (36.8 C), temperature source Oral, resp. rate 18, height 5\' 5"  (1.651 m), weight 106.1 kg, last menstrual period 03/31/2014, SpO2 91 %. Temp:  [98.1 F (36.7 C)-98.7 F (37.1 C)] 98.3 F (36.8 C) (09/26 0636) Pulse Rate:  [66-91] 66 (09/26 0636) Resp:  [18-20] 18 (09/26 0636) BP: (133-166)/(76-100) 153/80 (09/26 0636) SpO2:  [90 %-98 %] 91 % (09/26 0636)  Physical Exam: General: Alert and awake, oriented x3, not in any acute distress. HEENT: anicteric sclera, EOMI CVS regular rate, normal  Chest: , no wheezing, no respiratory distress Abdomen: soft non-distended,  Extremities: no edema or deformity noted bilaterally Skin: no rashes Neuro: nonfocal  Breast skin:  Protuberant area of what seems to be an abscess near her areola see picture below July 13, 2019:       CBC:    BMET Recent Labs    07/11/19 1400 07/12/19 0532 07/13/19 0500  NA 139 140  --   K 3.6 3.6  --   CL 103 105  --   CO2 26 26  --  GLUCOSE 238* 121*  --   BUN 33* 27*  --   CREATININE 1.79* 1.55* 1.56*  CALCIUM 8.8* 8.6*  --      Liver Panel  Recent Labs    07/11/19 1400 07/12/19 0532  PROT 7.8 7.7  ALBUMIN 3.0* 2.9*  AST 34 33  ALT 21 21  ALKPHOS 79 81  BILITOT 0.2* 0.4       Sedimentation Rate No results for input(s): ESRSEDRATE in the last 72 hours. C-Reactive Protein No results for input(s): CRP in the last 72 hours.  Micro Results: Recent Results (from the past 720 hour(s))  Urine Culture     Status: Abnormal   Collection Time: 06/14/19 10:45 AM   Specimen: Urine, Clean Catch  Result Value Ref Range Status   Specimen Description   Final    URINE, CLEAN CATCH Performed at [facility], [address] [place], [place] [address]    Special Requests    Final    NONE Performed at [facility], [place] [address] [place], [place] [address]    Culture 50,000 COLONIES/mL PROTEUS MIRABILIS (A)  Final   Report Status 06/17/2019 FINAL  Final   Organism ID, Bacteria PROTEUS MIRABILIS (A)  Final      Susceptibility   Proteus mirabilis - MIC*    AMPICILLIN <=2 SENSITIVE Sensitive     CEFAZOLIN <=4 SENSITIVE Sensitive     CEFTRIAXONE <=1 SENSITIVE Sensitive     CIPROFLOXACIN <=0.25 SENSITIVE Sensitive     GENTAMICIN 8 INTERMEDIATE Intermediate     IMIPENEM 8 INTERMEDIATE Intermediate     NITROFURANTOIN RESISTANT Resistant     TRIMETH/SULFA <=20 SENSITIVE Sensitive     AMPICILLIN/SULBACTAM <=2 SENSITIVE Sensitive     PIP/TAZO <=4 SENSITIVE Sensitive     * 50,000 COLONIES/mL PROTEUS MIRABILIS  SARS CORONAVIRUS 2 (TAT 6-24 HRS) Nasopharyngeal Nasopharyngeal Swab     Status: None   Collection Time: 07/11/19  2:00 PM   Specimen: Nasopharyngeal Swab  Result Value Ref Range Status   SARS Coronavirus 2 NEGATIVE NEGATIVE Final    Comment: (NOTE) SARS-CoV-2 target nucleic acids are NOT DETECTED. The SARS-CoV-2 RNA is generally detectable in upper and lower respiratory specimens during the acute phase of infection. Negative results do not preclude SARS-CoV-2 infection, do not rule out co-infections with other pathogens, and should not be used as the sole basis for treatment or other patient management decisions. Negative results must be combined with clinical observations, patient history, and epidemiological information. The expected result is Negative. Fact Sheet for Patients: SugarRoll.be Fact Sheet for Healthcare Providers: https://www.woods-mathews.com/ This test is not yet approved or cleared by the [place] FDA and  has been authorized for detection and/or diagnosis of SARS-CoV-2 by FDA under an Emergency Use Authorization (EUA). This EUA will remain  in effect (meaning  this test can be used) for the duration of the COVID-19 declaration under Section 56 4(b)(1) of the Act, 21 U.S.C. section 360bbb-3(b)(1), unless the authorization is terminated or revoked sooner. Performed at [facility], [place] [address], [place], [place] [address]     Studies/Results: Dg Chest Port 1 View  Result Date: 07/11/2019 CLINICAL DATA:  53 year old female with a history of breast pain on the right EXAM: PORTABLE CHEST 1 VIEW COMPARISON:  June 28, 2019 FINDINGS: Cardiomediastinal silhouette unchanged in size and contour. No pneumothorax. No pleural effusion. No confluent airspace disease. Unchanged left chest wall IJ port catheter. IMPRESSION: Negative for acute cardiopulmonary  disease. Left chest wall port catheter unchanged Electronically Signed   By: Corrie Mckusick D.O.   On: 07/11/2019 13:33   [place] Chest Soft Tissue  Result Date: 07/11/2019 CLINICAL DATA:  Examination is of the right breast. Patient has nipple discharge and pain. She underwent a right lumpectomy in November 2019. EXAM: ULTRASOUND OF THE RIGHT BREAST COMPARISON:  None. FINDINGS: Targeted ultrasound is performed, showing diffuse edema and hyperechoic fat consistent with inflammation. In the retroareolar region there is a complex collection measuring 2.3 x 2.1 cm with an adjacent similar appearing collection measuring 1.6 x 1.1 cm. These findings are consistent with abscesses. IMPRESSION: Findings consistent with right mastitis with right breast retroareolar abscess/abscesses. RECOMMENDATION: 1. Recommend beginning antibiotic therapy targeting the typical breast infection causes. Consider doxycycline, Augmentin or clindamycin. 2. Follow-up diagnostic evaluation in the breast Center tomorrow or the first of next week for reassessment and probable abscess aspiration. I have discussed the findings and recommendations with the patient. If applicable, a reminder letter will be sent to the patient regarding the next  appointment. BI-RADS CATEGORY  2: Benign. Electronically Signed   By: Lajean Manes M.D.   On: 07/11/2019 19:48      Assessment/Plan:  INTERVAL HISTORY: IR have stated General surgery need to be ones doing I and D   Principal Problem:   Abscess of breast, right Active Problems:   Human immunodeficiency virus (HIV) disease ([place])   Cigarette smoker   Essential hypertension   Asthma   Hidradenitis suppurativa of right >> left axilla   Non-insulin dependent type 2 diabetes mellitus (HCC)   Chronic anxiety   Hyperlipidemia   Malignant neoplasm of lower-inner quadrant of right breast of female, estrogen receptor positive (HCC)   Cellulitis of breast   Cellulitis   CKD (chronic kidney disease) stage 3, GFR 30-59 ml/min (HCC)   Normocytic anemia    [name] is a 53 y.o. female with well-controlled HIV, breast cancer, history of hidradenitis on chronic doxycycline status post lumpectomy now is developed a breast abscess  #1 breast abscess:  Please consult general surgery for I&D so that this area can be opened up and we can also send cultures for microbes.  Okay to continue vancomycin in the interim  #2 HIV disease perfectly controlled   LOS: 2 days   Alcide Evener 07/13/2019, 10:59 AM

## 2019-07-13 NOTE — Progress Notes (Signed)
Pharmacy Antibiotic Note  [name] is a 53 y.o. female admitted on 07/11/2019 with right breast cellulitis and abscesses. Pharmacy has been consulted for Vancomycin dosing.  Plan: Adjust Vancomycin to 1g IV q24h. Vancomycin levels at steady state, as indicated. Monitor renal function, cultures, clinical course, ID recommendations.   Height: 5\' 5"  (165.1 cm) Weight: 234 lb (106.1 kg) IBW/kg (Calculated) : 57  Temp (24hrs), Avg:98.5 F (36.9 C), Min:98.3 F (36.8 C), Max:98.7 F (37.1 C)  Recent Labs  Lab 07/11/19 1400 07/12/19 0532 07/13/19 0500  WBC 7.9 7.9  --   CREATININE 1.79* 1.55* 1.54*  1.56*    Estimated Creatinine Clearance: 51.6 mL/min (A) (by C-G formula based on SCr of 1.56 mg/dL (H)).    Allergies  Allergen Reactions  . Genvoya [Elviteg-Cobic-Emtricit-Tenofaf] Hives  . Lisinopril Cough  . Valsartan Other (See Comments) and Cough    Pt states she tolerates medicine now    Antimicrobials this admission: 9/24 Vancomycin >>  Microbiology results: 9/24 COVID: negative   Thank you for allowing pharmacy to be a part of this patient's care.  Luiz Ochoa 07/13/2019 2:18 PM

## 2019-07-13 NOTE — Consult Note (Signed)
Reason for Consult:breast abscess Referring Physician: Dr. Lanier Ensign [name] is an 53 y.o. female.  HPI: This patient is well-known to [facility].  She has a history of right breast cancer as well as hidradenitis.  She has undergone neoadjuvant chemotherapy originally and then underwent a right breast lumpectomy and sentinel node biopsy.  She has since had radiation therapy to the right breast which was completed earlier this year.  He developed discomfort and pain in her breast about a week ago.  She has been admitted with breast abscess and mastitis.  She had an ultrasound of the right breast showing 2 complex fluid collections that were retroareolar.  We were asked to see the patient at the request of interventional radiology.  She reports minimal improvement since admission.  She has had no drainage from the nipple currently.  Past Medical History:  Diagnosis Date  . Allergy   . Anemia    Normocytic  . Anxiety   . Asthma   . Blood dyscrasia   . Bronchitis 2005  . 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 Diabetes mellitus without complication ([place])   . Family history of breast cancer   . Family history of lung cancer   . Family history of prostate cancer   . Gastroenteritis 07/10/07  . GERD 07/24/2006  . GRAVE'S DISEASE 01/01/2008  . History of hidradenitis suppurativa   . History of kidney stones   . History of thrush   . HIV DISEASE 07/24/2006   dx March 05  . Hyperlipidemia   . HYPERTENSION 07/24/2006  . Hyperthyroidism 08/2006   Grave's Disease -diffuse radiotracer uptake 08/25/06 Thyroid scan-Cold nodule to R lower lobe of thyrorid  . Menometrorrhagia    hx of  . Nephrolithiasis   . 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 Personal history of chemotherapy    2020  . Personal history of radiation therapy    2020  . Pneumonia 2005  . 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 Sarcoidosis 02/08/2007    dx as a teenager in [place] from abnl CXR. Completed 2 yrs Prednisone after lung bx confirmation. No symptoms since then.  . Suppurative hidradenitis   . Thyroid cancer ([place])   . THYROID NODULE, RIGHT 02/08/2007    Past Surgical History:  Procedure Laterality Date  . BREAST EXCISIONAL BIOPSY Right 04/26/2018   right axilla negative  . BREAST EXCISIONAL BIOPSY Left 04/26/2018   left axilla negative  . BREAST LUMPECTOMY Right 10/03/2018   malignant  . BREAST LUMPECTOMY WITH RADIOACTIVE SEED AND SENTINEL LYMPH NODE BIOPSY Right 10/03/2018   Procedure: RIGHT BREAST LUMPECTOMY WITH RADIOACTIVE SEED AND SENTINEL LYMPH NODE MAPPING;  Surgeon: Er[facility], MD;  Location: [facility];  Service: General;  Laterality: Right;  . BREAST SURGERY  1997   Breast Reduction   . CYSTOSCOPY W/ URETERAL STENT REMOVAL  11/09/2012   Procedure: CYSTOSCOPY WITH STENT REMOVAL;  Surgeon: Alexis Frock, MD;  Location[facility];  Service: Urology;  Laterality: Right;  . CYSTOSCOPY WITH RETROGRADE PYELOGRAM, URETEROSCOPY AND STENT PLACEMENT  11/09/2012   Procedure: CYSTOSCOPY WITH RETROGRADE PYELOGRAM, URETEROSCOPY AND STENT PLACEMENT;  Surgeon: Alexis Frock, MD;  Location[facility];  Service: Urology;  Laterality: Left;  LEFT URETEROSCOPY, STONE MANIPULATION, left STENT exchange   . CYSTOSCOPY WITH STENT PLACEMENT  10/02/2012   Procedure: CYSTOSCOPY WITH STENT PLACEMENT;  Surgeon: Alexis Frock, MD;  [facility];  Service: Urology;  Laterality: Left;  . DILATION AND  CURETTAGE OF UTERUS  Feb 2004   s/p for 1st trimester nonviable pregnancy  . EYE SURGERY     sty under eyelid  . INCISE AND DRAIN ABCESS  Nov 03   s/p I &D for righ inframmary fold hidradenitis  . INCISION AND DRAINAGE PERITONSILLAR ABSCESS  Mar 03  . IR CV LINE INJECTION  06/07/2018  . IR IMAGING GUIDED PORT INSERTION  06/20/2018  . IR REMOVAL TUN ACCESS W/ PORT W/O FL MOD SED  06/20/2018  . IRRIGATION AND DEBRIDEMENT ABSCESS  01/31/2012    Procedure: IRRIGATION AND DEBRIDEMENT ABSCESS;  Surgeon: Shann [facility], MD;  [facility];  Service: General;  Laterality: Right;  right breast and axilla   . NEPHROLITHOTOMY  10/02/2012   Procedure: NEPHROLITHOTOMY PERCUTANEOUS;  Surgeon: Alexis Frock, MD;  [facility];  Service: Urology;  Laterality: Right;  First Stage Percutaneous Nephrolithotomy with Surgeon Access, Left Ureteral Stent    . NEPHROLITHOTOMY  10/04/2012   Procedure: NEPHROLITHOTOMY PERCUTANEOUS SECOND LOOK;  Surgeon: Alexis Frock, MD;  [facility];  Service: Urology;  Laterality: Right;     . NEPHROLITHOTOMY  10/08/2012   Procedure: NEPHROLITHOTOMY PERCUTANEOUS;  Surgeon: Alexis Frock, MD;  [facility];  Service: Urology;  Laterality: Right;  THIRD STAGE, nephrostomy tube exchange x 2  . NEPHROLITHOTOMY  10/11/2012   Procedure: NEPHROLITHOTOMY PERCUTANEOUS SECOND LOOK;  Surgeon: Alexis Frock, MD;  [facility];  Service: Urology;  Laterality: Right;  RIGHT 4 STAGE PERCUTANOUS NEPHROLITHOTOMY, right URETEROSCOPY WITH HOLMIUM LASER   . PORTACATH PLACEMENT Left 05/17/2018   Procedure: INSERTION PORT-A-CATH;  Surgeon: Coralie Keens, MD;  Location: [facility];  Service: General;  Laterality: Left;  . RADICAL NECK DISSECTION  04/12/2017   limited/notes 04/12/2017  . RADICAL NECK DISSECTION N/A 04/12/2017   Procedure: RADICAL NECK DISSECTION;  Surgeon: Melida Quit[facility], MD;  Location: [place];  Service: ENT;  Laterality: N/A;  limited neck dissection 2 hours total  . REDUCTION MAMMAPLASTY Bilateral 1998  . [place]  1994  . THYROIDECTOMY  04/12/2017   completion/notes 04/12/2017  . THYROIDECTOMY N/A 04/12/2017   Procedure: THYROIDECTOMY;  Surgeon: Melida Quitter, MD;  Location: [facility];  Service: ENT;  Laterality: N/A;  Completion Thyroidectomy  . TOTAL THYROIDECTOMY  2010    Family History  Problem Relation Age of Onset  . Hypertension Mother   . Cancer Mother        laryngeal  . Heart  disease Mother        stent  . Hypertension Father   . Lung cancer Father 71       hx smoking  . Heart disease Other   . Hypertension Other   . Stroke Other        Grandparent  . Kidney disease Other        Grandparent  . Diabetes Other        FH of Diabetes  . Hypertension Sister   . Cancer Maternal Uncle        Lung CA  . Hypertension Brother   . Hypertension Sister   . Breast cancer Maternal Aunt 65  . Breast cancer Paternal Aunt 56  . Prostate cancer Paternal Uncle   . Breast cancer Maternal Aunt        dx 60+  . Breast cancer Paternal Aunt        dx 33's  . Breast cancer Paternal Aunt        dx 44's  . Prostate cancer  Paternal Uncle   . Lung cancer Paternal Uncle   . Breast cancer Cousin 41  . Breast cancer Cousin        dx <50  . Breast cancer Cousin        dx <50  . Breast cancer Cousin        dx <50  . Colon cancer Neg Hx   . Esophageal cancer Neg Hx   . Rectal cancer Neg Hx   . Stomach cancer Neg Hx     Social History:  reports that she has quit smoking. Her smoking use included cigarettes. She started smoking about 2 years ago. She has a 7.50 pack-year smoking history. She has never used smokeless tobacco. She reports current alcohol use. She reports that she does not use drugs.  Allergies:  Allergies  Allergen Reactions  . Genvoya [Elviteg-Cobic-Emtricit-Tenofaf] Hives  . Lisinopril Cough  . Valsartan Other (See Comments) and Cough    Pt states she tolerates medicine now    Medications: I have reviewed the patient's current medications.  Results for orders placed or performed during the hospital encounter of 07/11/19 (from the past 48 hour(s))  CBG monitoring, ED     Status: Abnormal   Collection Time: 07/11/19  5:57 PM  Result Value Ref Range   Glucose-Capillary 165 (H) 70 - 99 mg/dL  CBG monitoring, ED     Status: Abnormal   Collection Time: 07/11/19 10:44 PM  Result Value Ref Range   Glucose-Capillary 255 (H) 70 - 99 mg/dL  Comprehensive  metabolic panel     Status: Abnormal   Collection Time: 07/12/19  5:32 AM  Result Value Ref Range   Sodium 140 135 - 145 mmol/L   Potassium 3.6 3.5 - 5.1 mmol/L   Chloride 105 98 - 111 mmol/L   CO2 26 22 - 32 mmol/L   Glucose, Bld 121 (H) 70 - 99 mg/dL   BUN 27 (H) 6 - 20 mg/dL   Creatinine, Ser 1.55 (H) 0.44 - 1.00 mg/dL   Calcium 8.6 (L) 8.9 - 10.3 mg/dL   Total Protein 7.7 6.5 - 8.1 g/dL   Albumin 2.9 (L) 3.5 - 5.0 g/dL   AST 33 15 - 41 U/L   ALT 21 0 - 44 U/L   Alkaline Phosphatase 81 38 - 126 U/L   Total Bilirubin 0.4 0.3 - 1.2 mg/dL   GFR calc non Af Amer 38 (L) >60 mL/min   GFR calc Af Amer 44 (L) >60 mL/min   Anion gap 9 5 - 15    Comment: Performed at [facility], [place] [address], [place], [place] [address]  CBC     Status: Abnormal   Collection Time: 07/12/19  5:32 AM  Result Value Ref Range   WBC 7.9 4.0 - 10.5 K/uL   RBC 3.74 (L) 3.87 - 5.11 MIL/uL   Hemoglobin 11.4 (L) 12.0 - 15.0 g/dL   HCT 35.2 (L) 36.0 - 46.0 %   MCV 94.1 80.0 - 100.0 fL   MCH 30.5 26.0 - 34.0 pg   MCHC 32.4 30.0 - 36.0 g/dL   RDW 17.0 (H) 11.5 - 15.5 %   Platelets 161 150 - 400 K/uL   nRBC 0.0 0.0 - 0.2 %    Comment: Performed at [facility], [place] [address] [place],  [address]  CBG monitoring, ED     Status: Abnormal   Collection Time: 07/12/19  8:08 AM  Result Value Ref Range  Glucose-Capillary 110 (H) 70 - 99 mg/dL  CBG monitoring, ED     Status: Abnormal   Collection Time: 07/12/19 12:05 PM  Result Value Ref Range   Glucose-Capillary 252 (H) 70 - 99 mg/dL  T-helper cells (CD4) count (not at [facility])     Status: None   Collection Time: 07/12/19 12:41 PM  Result Value Ref Range   CD4 T Cell Abs 778 400 - 1,790 /uL   CD4 % Helper T Cell 40 33 - 65 %    Comment: Performed at [facility], [place] [address] [place], [place] [address]  Glucose, capillary     Status: Abnormal   Collection Time: 07/12/19  4:24 PM  Result Value  Ref Range   Glucose-Capillary 103 (H) 70 - 99 mg/dL   Comment 1 Notify RN    Comment 2 Document in Chart   Glucose, capillary     Status: Abnormal   Collection Time: 07/12/19  9:31 PM  Result Value Ref Range   Glucose-Capillary 164 (H) 70 - 99 mg/dL  Creatinine, serum     Status: Abnormal   Collection Time: 07/13/19  5:00 AM  Result Value Ref Range   Creatinine, Ser 1.56 (H) 0.44 - 1.00 mg/dL   GFR calc non Af Amer 38 (L) >60 mL/min   GFR calc Af Amer 44 (L) >60 mL/min    Comment: Performed at [facility], [place] [address] [place], [place] [address]  Basic metabolic panel     Status: Abnormal   Collection Time: 07/13/19  5:00 AM  Result Value Ref Range   Sodium 139 135 - 145 mmol/L   Potassium 3.4 (L) 3.5 - 5.1 mmol/L   Chloride 105 98 - 111 mmol/L   CO2 25 22 - 32 mmol/L   Glucose, Bld 132 (H) 70 - 99 mg/dL   BUN 23 (H) 6 - 20 mg/dL   Creatinine, Ser 1.54 (H) 0.44 - 1.00 mg/dL   Calcium 8.4 (L) 8.9 - 10.3 mg/dL   GFR calc non Af Amer 39 (L) >60 mL/min   GFR calc Af Amer 45 (L) >60 mL/min   Anion gap 9 5 - 15    Comment: Performed at [facility], [place] [address] [place], [place] [address]  Glucose, capillary     Status: Abnormal   Collection Time: 07/13/19  8:08 AM  Result Value Ref Range   Glucose-Capillary 137 (H) 70 - 99 mg/dL   Comment 1 Notify RN   Glucose, capillary     Status: Abnormal   Collection Time: 07/13/19 12:02 PM  Result Value Ref Range   Glucose-Capillary 158 (H) 70 - 99 mg/dL   Comment 1 Notify RN     [place] Chest Soft Tissue  Result Date: 07/11/2019 CLINICAL DATA:  Examination is of the right breast. Patient has nipple discharge and pain. She underwent a right lumpectomy in November 2019. EXAM: ULTRASOUND OF THE RIGHT BREAST COMPARISON:  None. FINDINGS: Targeted ultrasound is performed, showing diffuse edema and hyperechoic fat consistent with inflammation. In the retroareolar region there is a complex collection  measuring 2.3 x 2.1 cm with an adjacent similar appearing collection measuring 1.6 x 1.1 cm. These findings are consistent with abscesses. IMPRESSION: Findings consistent with right mastitis with right breast retroareolar abscess/abscesses. RECOMMENDATION: 1. Recommend beginning antibiotic therapy targeting the typical breast infection causes. Consider doxycycline, Augmentin or clindamycin. 2. Follow-up diagnostic evaluation in the breast Center tomorrow or the first of next week for  reassessment and probable abscess aspiration. I have discussed the findings and recommendations with the patient. If applicable, a reminder letter will be sent to the patient regarding the next appointment. BI-RADS CATEGORY  2: Benign. Electronically Signed   By: Lajean Manes M.D.   On: 07/11/2019 19:48    Review of Systems  All other systems reviewed and are negative.  Blood pressure 118/64, pulse 73, temperature 98.3 F (36.8 C), temperature source Oral, resp. rate 18, height 5\' 5"  (1.651 m), weight 106.1 kg, last menstrual period 03/31/2014, SpO2 95 %. Physical Exam  Constitutional: She appears well-developed and well-nourished. No distress.  Cardiovascular: Normal rate, regular rhythm and normal heart sounds.  Respiratory: Effort normal and breath sounds normal. No respiratory distress.  GI: Soft.  Skin: She is not diaphoretic.  Breast: The there is cellulitis of the right breast with tenderness around the areola and swelling.  There is some excoriation of the nipple and 2 well-healed incisions.   Assessment/Plan: Right breast abscess with cellulitis  Our standard has been to have radiology aspirate the abscess unless it is currently eroding through the skin.  This is usually performed at the breast center 2 to 3 times before considering Incision and Drainage.  I recommend IR attempting an aspiration especially for the fact that this is recently radiated tissue and she could end up with a large open wound that  may not heal. I discussed this with her and she agrees with the plan.    Coralie Keens 07/13/2019, 2:10 PM

## 2019-07-14 ENCOUNTER — Inpatient Hospital Stay (HOSPITAL_COMMUNITY): Payer: 59

## 2019-07-14 LAB — GLUCOSE, CAPILLARY
Glucose-Capillary: 149 mg/dL — ABNORMAL HIGH (ref 70–99)
Glucose-Capillary: 129 mg/dL — ABNORMAL HIGH (ref 70–99)
Glucose-Capillary: 250 mg/dL — ABNORMAL HIGH (ref 70–99)
Glucose-Capillary: 203 mg/dL — ABNORMAL HIGH (ref 70–99)

## 2019-07-14 LAB — COMPREHENSIVE METABOLIC PANEL
ALT: 25 U/L (ref 0–44)
AST: 40 U/L (ref 15–41)
Albumin: 2.7 g/dL — ABNORMAL LOW (ref 3.5–5.0)
Alkaline Phosphatase: 78 U/L (ref 38–126)
Anion gap: 10 (ref 5–15)
BUN: 21 mg/dL — ABNORMAL HIGH (ref 6–20)
CO2: 25 mmol/L (ref 22–32)
Calcium: 8.6 mg/dL — ABNORMAL LOW (ref 8.9–10.3)
Chloride: 106 mmol/L (ref 98–111)
Creatinine, Ser: 1.48 mg/dL — ABNORMAL HIGH (ref 0.44–1.00)
GFR calc Af Amer: 47 mL/min — ABNORMAL LOW (ref >60)
GFR calc non Af Amer: 41 mL/min — ABNORMAL LOW (ref >60)
Glucose, Bld: 130 mg/dL — ABNORMAL HIGH (ref 70–99)
Potassium: 3.5 mmol/L (ref 3.5–5.1)
Sodium: 141 mmol/L (ref 135–145)
Total Bilirubin: 0.3 mg/dL (ref 0.3–1.2)
Total Protein: 7.2 g/dL (ref 6.5–8.1)

## 2019-07-14 LAB — HIV-1 RNA, PCR (GRAPH) RFX/GENO EDI
HIV-1 RNA BY PCR: 30 copies/mL
HIV-1 RNA Quant, Log: 1.477 log10copy/mL

## 2019-07-14 LAB — CBC WITH DIFFERENTIAL/PLATELET
Abs Immature Granulocytes: 0.06 10*3/uL (ref 0.00–0.07)
Basophils Absolute: 0 10*3/uL (ref 0.0–0.1)
Basophils Relative: 0 %
Eosinophils Absolute: 0.1 10*3/uL (ref 0.0–0.5)
Eosinophils Relative: 2 %
HCT: 33.8 % — ABNORMAL LOW (ref 36.0–46.0)
Hemoglobin: 10.9 g/dL — ABNORMAL LOW (ref 12.0–15.0)
Immature Granulocytes: 1 %
Lymphocytes Relative: 26 %
Lymphs Abs: 2 10*3/uL (ref 0.7–4.0)
MCH: 30.5 pg (ref 26.0–34.0)
MCHC: 32.2 g/dL (ref 30.0–36.0)
MCV: 94.7 fL (ref 80.0–100.0)
Monocytes Absolute: 0.8 10*3/uL (ref 0.1–1.0)
Monocytes Relative: 11 %
Neutro Abs: 4.5 10*3/uL (ref 1.7–7.7)
Neutrophils Relative %: 60 %
Platelets: 161 10*3/uL (ref 150–400)
RBC: 3.57 MIL/uL — ABNORMAL LOW (ref 3.87–5.11)
RDW: 16.6 % — ABNORMAL HIGH (ref 11.5–15.5)
WBC: 7.4 10*3/uL (ref 4.0–10.5)
nRBC: 0 % (ref 0.0–0.2)

## 2019-07-14 LAB — PROTIME-INR
INR: 1 (ref 0.8–1.2)
Prothrombin Time: 13 seconds (ref 11.4–15.2)

## 2019-07-14 MED ORDER — MIDAZOLAM HCL 2 MG/2ML IJ SOLN
INTRAMUSCULAR | Status: AC
  Administered 2019-07-14: 17:00:00
  Filled 2019-07-14: qty 2

## 2019-07-14 MED ORDER — FENTANYL CITRATE (PF) 100 MCG/2ML IJ SOLN
INTRAMUSCULAR | Status: AC | PRN
  Administered 2019-07-14: 25 ug via INTRAVENOUS
  Administered 2019-07-14: 50 ug via INTRAVENOUS
  Administered 2019-07-14: 25 ug via INTRAVENOUS

## 2019-07-14 MED ORDER — FENTANYL CITRATE (PF) 100 MCG/2ML IJ SOLN
INTRAMUSCULAR | Status: AC
  Administered 2019-07-14: 14:00:00
  Filled 2019-07-14: qty 2

## 2019-07-14 MED ORDER — LIDOCAINE HCL 1 % IJ SOLN
INTRAMUSCULAR | Status: AC
  Administered 2019-07-14: 14:00:00
  Filled 2019-07-14: qty 20

## 2019-07-14 MED ORDER — MIDAZOLAM HCL 2 MG/2ML IJ SOLN
INTRAMUSCULAR | Status: AC | PRN
  Administered 2019-07-14: 1 mg via INTRAVENOUS
  Administered 2019-07-14 (×2): 0.5 mg via INTRAVENOUS

## 2019-07-14 MED ORDER — SODIUM CHLORIDE 0.9 % IV SOLN
INTRAVENOUS | Status: AC | PRN
  Administered 2019-07-14: 10 mL/h via INTRAVENOUS

## 2019-07-14 MED ORDER — MORPHINE SULFATE (PF) 2 MG/ML IV SOLN
2.0000 mg | Freq: Once | INTRAVENOUS | Status: AC
  Administered 2019-07-14: 2 mg via INTRAVENOUS

## 2019-07-14 NOTE — Progress Notes (Signed)
   Subjective/Chief Complaint: More comfortable today with less pain   Objective: Vital signs in last 24 hours: Temp:  [98 F (36.7 C)-98.4 F (36.9 C)] 98.3 F (36.8 C) (09/27 0519) Pulse Rate:  [62-92] 62 (09/27 0519) Resp:  [16-20] 20 (09/27 0519) BP: (118-159)/(64-88) 159/88 (09/27 0519) SpO2:  [93 %-95 %] 93 % (09/27 0519) Last BM Date: 07/12/19  Intake/Output from previous day: 09/26 0701 - 09/27 0700 In: 959.2 [P.O.:720; IV Piggyback:239.2] Out: -  Intake/Output this shift: No intake/output data recorded.  Exam: Right breast cellulitis and tenderness are slightly improved  Lab Results:  Recent Labs    07/12/19 0532 07/14/19 0312  WBC 7.9 7.4  HGB 11.4* 10.9*  HCT 35.2* 33.8*  PLT 161 161   BMET Recent Labs    07/13/19 0500 07/14/19 0312  NA 139 141  K 3.4* 3.5  CL 105 106  CO2 25 25  GLUCOSE 132* 130*  BUN 23* 21*  CREATININE 1.54*  1.56* 1.48*  CALCIUM 8.4* 8.6*   PT/INR Recent Labs    07/14/19 0312  LABPROT 13.0  INR 1.0   ABG No results for input(s): PHART, HCO3 in the last 72 hours.  Invalid input(s): PCO2, PO2  Studies/Results: No results found.  Anti-infectives: Anti-infectives (From admission, onward)   Start     Dose/Rate Route Frequency Ordered Stop   07/14/19 1200  vancomycin (VANCOCIN) IVPB 1000 mg/200 mL premix     1,000 mg 200 mL/hr over 60 Minutes Intravenous Every 24 hours 07/13/19 1417     07/13/19 0600  vancomycin (VANCOCIN) 1,250 mg in sodium chloride 0.9 % 250 mL IVPB  Status:  Discontinued     1,250 mg 166.7 mL/hr over 90 Minutes Intravenous Every 36 hours 07/11/19 1753 07/13/19 1417   07/11/19 1830  emtricitabine-tenofovir AF (DESCOVY) 200-25 MG per tablet 1 tablet     1 tablet Oral Daily 07/11/19 1738     07/11/19 1830  dolutegravir ([name]) tablet 50 mg     50 mg Oral Daily 07/11/19 1738     07/11/19 1800  vancomycin (VANCOCIN) 1,250 mg in sodium chloride 0.9 % 250 mL IVPB     1,250 mg 166.7 mL/hr over  90 Minutes Intravenous STAT 07/11/19 1751 07/11/19 2241   07/11/19 1745  vancomycin (VANCOCIN) IVPB 1000 mg/200 mL premix  Status:  Discontinued     1,000 mg 200 mL/hr over 60 Minutes Intravenous  Once 07/11/19 1735 07/11/19 1747   07/11/19 1330  vancomycin (VANCOCIN) IVPB 1000 mg/200 mL premix     1,000 mg 200 mL/hr over 60 Minutes Intravenous  Once 07/11/19 1317 07/11/19 1508      Assessment/Plan:  Right breast abscess  For IR aspiration today. Again, hopefully she will improve without the need for a formal I and D with risks of having non-healing wound given radiated tissue.    LOS: 3 days    Coralie Keens 07/14/2019

## 2019-07-14 NOTE — Progress Notes (Signed)
PROGRESS NOTE    [name]  BTD:[id number]  DOB: 10-Sep-1966  DOA: 07/11/2019 PCP: [name], NP  Brief Narrative:   53 y.o. female with medical history significant of Breast cancer s/p chemo and followed by Oncology, DM, HIV, HTN presents to ED with complaints of worsening R breast pain, tenderness, and swelling after she tried to pop a pimple/blackhead 2 week PTA.Area then became more inflamed with increased pain,swelling and she noted dark colored drainage She also reported subjective fevers, chills, sob. Pt already on oral daily doxycyline for tx of hidradenitis. Recently completed 5 day course of oral cipro for UTI (8/28) ED Course: In the ED, pt noted to be afebrile with no leukocytosis. CXR was clear. Cr of 1.79. Given failure of outpatient doxycycline, pt was started on empiric vancomycin and hospitalist consulted for consideration for admission. Patient evaluated by Dr [name] this morning and agreed with inpatient IV abx therapy.   Subjective:  No acute complaint no nausea no vomiting.  Feeling hungry.  No fever no chills.  No chest pain abdominal pain.   Objective: Vitals:   07/14/19 1405 07/14/19 1411 07/14/19 1422 07/14/19 1553  BP: 122/69 124/72 128/72 127/67  Pulse: 69 68 69 67  Resp: _0 Temp:    98.8 F (37.1 C)  TempSrc:    Oral  SpO2: 97% 95% 96% (!) 89%  Weight:      Height:        Intake/Output Summary (Last 24 hours) at 07/14/2019 1649 Last data filed at 07/14/2019 1500 Gross per 24 hour  Intake 860.44 ml  Output --  Net 860.44 ml   Filed Weights   07/12/19 0532  Weight: 106.1 kg    Physical Examination:  General exam: Appears calm and comfortable  Respiratory system: Clear to auscultation. Respiratory effort normal. Cardiovascular system: S1 & S2 heard, RRR. No JVD, murmurs, rubs, gallops or clicks. No pedal edema. Gastrointestinal system: Abdomen is nondistended, soft and nontender. No organomegaly or masses felt.  Normal bowel sounds heard. Central nervous system: Alert and oriented. No focal neurological deficits. Extremities: Symmetric 5 x 5 power. Skin: Right-sided retroareolar induration with adjacent chronic superficial abrasion/ulcerations.  No active drainage Psychiatry: Judgement and insight appear normal. Mood & affect appropriate.     Data Reviewed: I have personally reviewed following labs and imaging studies  CBC: Recent Labs  Lab 07/11/19 1400 07/12/19 0532 07/14/19 0312  WBC 7.9 7.9 7.4  NEUTROABS 5.2  --  4.5  HGB 11.7* 11.4* 10.9*  HCT 36.0 35.2* 33.8*  MCV 93.5 94.1 94.7  PLT 158 161 106   Basic Metabolic Panel: Recent Labs  Lab 07/11/19 1400 07/12/19 0532 07/13/19 0500 07/14/19 0312  NA 139 140 139 141  K 3.6 3.6 3.4* 3.5  CL 103 105 105 106  CO2 _1 GLUCOSE 238* 121* 132* 130*  BUN 33* 27* 23* 21*  CREATININE 1.79* 1.55* 1.54*   1.56* 1.48*  CALCIUM 8.8* 8.6* 8.4* 8.6*   GFR: Estimated Creatinine Clearance: 54.4 mL/min (A) (by C-G formula based on SCr of 1.48 mg/dL (H)). Liver Function Tests: Recent Labs  Lab 07/11/19 1400 07/12/19 0532 07/14/19 0312  AST 34 33 40  ALT _2 ALKPHOS 79 81 78  BILITOT 0.2* 0.4 0.3  PROT 7.8 7.7 7.2  ALBUMIN 3.0* 2.9* 2.7*   No results for input(s): LIPASE, AMYLASE in the last 168 hours. No results for input(s): AMMONIA in the last  168 hours. Coagulation Profile: Recent Labs  Lab 07/14/19 0312  INR 1.0   Cardiac Enzymes: No results for input(s): CKTOTAL, CKMB, CKMBINDEX, TROPONINI in the last 168 hours. BNP (last 3 results) No results for input(s): PROBNP in the last 8760 hours. HbA1C: No results for input(s): HGBA1C in the last 72 hours. CBG: Recent Labs  Lab 07/13/19 1202 07/13/19 1657 07/13/19 2046 07/14/19 0747 07/14/19 1201  GLUCAP 158* 154* 152* 149* 129*   Lipid Profile: No results for input(s): CHOL, HDL, LDLCALC, TRIG, CHOLHDL, LDLDIRECT in the last 72 hours. Thyroid Function  Tests: No results for input(s): TSH, T4TOTAL, FREET4, T3FREE, THYROIDAB in the last 72 hours. Anemia Panel: No results for input(s): VITAMINB12, FOLATE, FERRITIN, TIBC, IRON, RETICCTPCT in the last 72 hours. Sepsis Labs: No results for input(s): PROCALCITON, LATICACIDVEN in the last 168 hours.  Recent Results (from the past 240 hour(s))  SARS CORONAVIRUS 2 (TAT 6-24 HRS) Nasopharyngeal Nasopharyngeal Swab     Status: None   Collection Time: 07/11/19  2:00 PM   Specimen: Nasopharyngeal Swab  Result Value Ref Range Status   SARS Coronavirus 2 NEGATIVE NEGATIVE Final    Comment: (NOTE) SARS-CoV-2 target nucleic acids are NOT DETECTED. The SARS-CoV-2 RNA is generally detectable in upper and lower respiratory specimens during the acute phase of infection. Negative results do not preclude SARS-CoV-2 infection, do not rule out co-infections with other pathogens, and should not be used as the sole basis for treatment or other patient management decisions. Negative results must be combined with clinical observations, patient history, and epidemiological information. The expected result is Negative. Fact Sheet for Patients: SugarRoll.be Fact Sheet for Healthcare Providers: https://www.woods-mathews.com/ This test is not yet approved or cleared by the [place] FDA and  has been authorized for detection and/or diagnosis of SARS-CoV-2 by FDA under an Emergency Use Authorization (EUA). This EUA will remain  in effect (meaning this test can be used) for the duration of the COVID-19 declaration under Section 56 4(b)(1) of the Act, 21 U.S.C. section 360bbb-3(b)(1), unless the authorization is terminated or revoked sooner. Performed at [facility], [place] [address], [place], [place] [address]       Radiology Studies: US Guided Needle Placement  Result Date: 07/14/2019 INDICATION: Right retro-areolar breast abscess. EXAM: ULTRASOUND-GUIDED  ASPIRATION DRAINAGE OF RIGHT BREAST ABSCESS MEDICATIONS: The patient is currently admitted to the hospital and receiving intravenous antibiotics. The antibiotics were administered within an appropriate time frame prior to the initiation of the procedure. ANESTHESIA/SEDATION: Fentanyl 2.0 mcg IV; Versed 100 mg IV Moderate Sedation Time:  16 minutes. The patient was continuously monitored during the procedure by the interventional radiology nurse under my direct supervision. COMPLICATIONS: None immediate. PROCEDURE: Informed written consent was obtained from the patient after a thorough discussion of the procedural risks, benefits and alternatives. All questions were addressed. Maximal Sterile Barrier Technique was utilized including caps, mask, sterile gowns, sterile gloves, sterile drape, hand hygiene and skin antiseptic. A timeout was performed prior to the initiation of the procedure. Ultrasound was performed of the right breast. Overlying skin was prepped with chlorhexidine. Local anesthesia was provided with 1% lidocaine. Under direct ultrasound guidance, a 5 French centesis catheter was advanced over a coaxial 19 gauge needle into a complex fluid collection/abscess. Aspiration was performed. A sample of fluid was sent for culture analysis. Additional saline lavage was performed in the cavity with a total of 10 mL of sterile saline. Injected sterile saline was completely aspirated. The centesis catheter was removed.  FINDINGS: Complex retro-areolar collection measures approximately 4.6 x 1.5 x 3.7 cm by ultrasound. Aspiration yielded grossly purulent fluid. After aspiration of approximately 8 mL of purulent fluid, there was no longer any free return of fluid. Additional saline lavage was performed in the abscess cavity with sterile saline. Postprocedural ultrasound demonstrates significant improvement with decompression of the abscess and some residual soft tissue thickening and small amount of septated fluid  remaining. IMPRESSION: Ultrasound-guided aspiration of right retro-areolar breast abscess yielding grossly purulent fluid. Approximately 8 mL of purulent fluid was aspirated and sent for culture analysis. Additional sterile saline lavage was performed in a complex and septated abscess cavity. Electronically Signed   By: Aletta Edouard M.D.   On: 07/14/2019 15:33        Scheduled Meds:  amLODipine  10 mg Oral Daily   Chlorhexidine Gluconate Cloth  6 each Topical Daily   dolutegravir  50 mg Oral Daily   emtricitabine-tenofovir AF  1 tablet Oral Daily   fentaNYL       hydrochlorothiazide  12.5 mg Oral Daily   insulin aspart  0-15 Units Subcutaneous TID WC   insulin aspart  0-5 Units Subcutaneous QHS   irbesartan  150 mg Oral Daily   levothyroxine  175 mcg Oral Q0600   lidocaine       metoprolol succinate  50 mg Oral Daily   midazolam       rosuvastatin  10 mg Oral Daily   sodium chloride flush  10-40 mL Intracatheter Q12H   spironolactone  25 mg Oral Daily   tamoxifen  20 mg Oral Daily   Continuous Infusions:  vancomycin 1,000 mg (07/14/19 1216)    Assessment & Plan:    1. Right mastitis with right breast retroareolar abscesses: US soft tissue chest shows retroareolar complex collection measuring 2.3 x 2.1 cm with an adjacent similar appearing collection measuring 1.6 x 1.1 cm.  Patient seen by oncology Dr. [name] and ID Dr. [name], recommended to continue vancomycin for now.  ID recommends drainage of abscesses either by IR or general surgery.  IR currently performing drainage today.  Will await culture prior to discharge.  2.  History of right breast cancer: Status post surgery and chemo.  T2 N0, stage IB invasive ductal carcinoma, grade 2, ER/PR positive, HER-2 positive.  Continue tamoxifen.  Appreciate oncology evaluation and input.  3.  HIV: Seen by ID and recommended to continue Descovy and [name]. ID will check her CD4 count and viral load  4.   Hypertension: Resume home meds  5.  Diabetes mellitus: On glipizide at home.  Sliding scale insulin while here.  Blood glucose 110-250.  Continue diabetic diet.  Resume glipizide if persistently hyperglycemic  6.  Hypothyroidism: Synthroid  7.  Chronic hidradenitis right axilla: On daily doxycycline at home   DVT prophylaxis: Heparin- Code Status: Full code Family / Patient Communication: Discussed with patient Disposition Plan: Home when medically cleared     LOS: 3 days    Time spent: 35 minutes    Berle Mull, MD [facility]  If 7PM-7AM, please contact night-coverage [email or web address] Password [facility] 07/14/2019, 4:49 PM

## 2019-07-14 NOTE — Procedures (Signed)
Interventional Radiology Procedure Note  Procedure: US guided aspiration of right breast abscess  Complications: None  Estimated Blood Loss: < 10 mL  Findings: Prior to procedure today, patient had expression of pus from right areolar region with some skin breakdown.   [place] now still shows subareolar complex fluid consistent with abscess. Aspiration via 5 Fr Yueh catheter yielded 8 mL of pus. Additional sterile saline lavage performed in 2 different areas with a total of 10 mL of sterile saline. New dressing placed over site. Pus sample sent for culture analysis.  Venetia Night. Kathlene Cote, M.D Pager:  [phone]

## 2019-07-14 NOTE — Progress Notes (Signed)
Subjective:  She is waiting for IR procedure today  Antibiotics:  Anti-infectives (From admission, onward)   Start     Dose/Rate Route Frequency Ordered Stop   07/14/19 1200  vancomycin (VANCOCIN) IVPB 1000 mg/200 mL premix     1,000 mg 200 mL/hr over 60 Minutes Intravenous Every 24 hours 07/13/19 1417     07/13/19 0600  vancomycin (VANCOCIN) 1,250 mg in sodium chloride 0.9 % 250 mL IVPB  Status:  Discontinued     1,250 mg 166.7 mL/hr over 90 Minutes Intravenous Every 36 hours 07/11/19 1753 07/13/19 1417   07/11/19 1830  emtricitabine-tenofovir AF (DESCOVY) 200-25 MG per tablet 1 tablet     1 tablet Oral Daily 07/11/19 1738     07/11/19 1830  dolutegravir ([name]) tablet 50 mg     50 mg Oral Daily 07/11/19 1738     07/11/19 1800  vancomycin (VANCOCIN) 1,250 mg in sodium chloride 0.9 % 250 mL IVPB     1,250 mg 166.7 mL/hr over 90 Minutes Intravenous STAT 07/11/19 1751 07/11/19 2241   07/11/19 1745  vancomycin (VANCOCIN) IVPB 1000 mg/200 mL premix  Status:  Discontinued     1,000 mg 200 mL/hr over 60 Minutes Intravenous  Once 07/11/19 1735 07/11/19 1747   07/11/19 1330  vancomycin (VANCOCIN) IVPB 1000 mg/200 mL premix     1,000 mg 200 mL/hr over 60 Minutes Intravenous  Once 07/11/19 1317 07/11/19 1508      Medications: Scheduled Meds: . amLODipine  10 mg Oral Daily  . Chlorhexidine Gluconate Cloth  6 each Topical Daily  . dolutegravir  50 mg Oral Daily  . emtricitabine-tenofovir AF  1 tablet Oral Daily  . hydrochlorothiazide  12.5 mg Oral Daily  . insulin aspart  0-15 Units Subcutaneous TID WC  . insulin aspart  0-5 Units Subcutaneous QHS  . irbesartan  150 mg Oral Daily  . levothyroxine  175 mcg Oral Q0600  . metoprolol succinate  50 mg Oral Daily  . rosuvastatin  10 mg Oral Daily  . sodium chloride flush  10-40 mL Intracatheter Q12H  . spironolactone  25 mg Oral Daily  . tamoxifen  20 mg Oral Daily   Continuous Infusions: . vancomycin 1,000 mg (07/14/19  1216)   PRN Meds:.acetaminophen **OR** acetaminophen, morphine injection, sodium chloride flush    Objective: Weight change:   Intake/Output Summary (Last 24 hours) at 07/14/2019 1335 Last data filed at 07/13/2019 1700 Gross per 24 hour  Intake 719.17 ml  Output -  Net 719.17 ml   Blood pressure (!) 159/88, pulse 62, temperature 98.3 F (36.8 C), temperature source Oral, resp. rate 20, height 5\' 5"  (1.651 m), weight 106.1 kg, last menstrual period 03/31/2014, SpO2 93 %. Temp:  [98 F (36.7 C)-98.4 F (36.9 C)] 98.3 F (36.8 C) (09/27 0519) Pulse Rate:  [62-92] 62 (09/27 0519) Resp:  [16-20] 20 (09/27 0519) BP: (118-159)/(64-88) 159/88 (09/27 0519) SpO2:  [93 %-95 %] 93 % (09/27 0519)  Physical Exam: General: Alert and awake, oriented x3, not in any acute distress. HEENT: anicteric sclera, EOMI CVS regular rate, normal  Chest: , no wheezing, no respiratory distress Abdomen: soft non-distended,  Extremities: no edema or deformity noted bilaterally Skin: no rashes Neuro: nonfocal  Breast skin:  Not examined today  Yesterday with RN present took photo see below  Protuberant area of what seems to be an abscess near her areola see picture below July 13, 2019:  CBC:    BMET Recent Labs    07/13/19 0500 07/14/19 0312  NA 139 141  K 3.4* 3.5  CL 105 106  CO2 25 25  GLUCOSE 132* 130*  BUN 23* 21*  CREATININE 1.54*  1.56* 1.48*  CALCIUM 8.4* 8.6*     Liver Panel  Recent Labs    07/12/19 0532 07/14/19 0312  PROT 7.7 7.2  ALBUMIN 2.9* 2.7*  AST 33 40  ALT 21 25  ALKPHOS 81 78  BILITOT 0.4 0.3       Sedimentation Rate No results for input(s): ESRSEDRATE in the last 72 hours. C-Reactive Protein No results for input(s): CRP in the last 72 hours.  Micro Results: Recent Results (from the past 720 hour(s))  SARS CORONAVIRUS 2 (TAT 6-24 HRS) Nasopharyngeal Nasopharyngeal Swab     Status: None   Collection Time: 07/11/19  2:00 PM    Specimen: Nasopharyngeal Swab  Result Value Ref Range Status   SARS Coronavirus 2 NEGATIVE NEGATIVE Final    Comment: (NOTE) SARS-CoV-2 target nucleic acids are NOT DETECTED. The SARS-CoV-2 RNA is generally detectable in upper and lower respiratory specimens during the acute phase of infection. Negative results do not preclude SARS-CoV-2 infection, do not rule out co-infections with other pathogens, and should not be used as the sole basis for treatment or other patient management decisions. Negative results must be combined with clinical observations, patient history, and epidemiological information. The expected result is Negative. Fact Sheet for Patients: SugarRoll.be Fact Sheet for Healthcare Providers: https://www.woods-mathews.com/ This test is not yet approved or cleared by the [place] FDA and  has been authorized for detection and/or diagnosis of SARS-CoV-2 by FDA under an Emergency Use Authorization (EUA). This EUA will remain  in effect (meaning this test can be used) for the duration of the COVID-19 declaration under Section 56 4(b)(1) of the Act, 21 U.S.C. section 360bbb-3(b)(1), unless the authorization is terminated or revoked sooner. Performed at [facility], [place] [address], [place], [place] [address]     Studies/Results: No results found.    Assessment/Plan:  INTERVAL HISTORY: IR to aspirate for culture today  Principal Problem:   Abscess of breast, right Active Problems:   Human immunodeficiency virus (HIV) disease (HCC)   Cigarette smoker   Essential hypertension   Asthma   Hidradenitis suppurativa of right >> left axilla   Non-insulin dependent type 2 diabetes mellitus (HCC)   Chronic anxiety   Hyperlipidemia   Malignant neoplasm of lower-inner quadrant of right breast of female, estrogen receptor positive (HCC)   Cellulitis of breast   Cellulitis   CKD (chronic kidney disease) stage 3, GFR  30-59 ml/min (HCC)   Normocytic anemia    [name] is a 53 y.o. female with well-controlled HIV, breast cancer, history of hidradenitis on chronic doxycycline status post lumpectomy now is developed a breast abscess  #1 Breast abscess:  Agree with IR aspirate  IF she has ONLY gram + organisms could consider one time dose of ORITAVANCIN (provided insurance would cover this)  She is very anxious to go home but I would at MINIMUM like to see a gram stain FIRST  She could certainly also harbor GNR given hx of HS  #2 HIV disease perfectly controlled  Dr. [name] will be back tomorrow.   LOS: 3 days   Alcide Evener 07/14/2019, 1:35 PM

## 2019-07-15 ENCOUNTER — Other Ambulatory Visit: Payer: 59

## 2019-07-15 DIAGNOSIS — Z888 Allergy status to other drugs, medicaments and biological substances status: Secondary | ICD-10-CM

## 2019-07-15 LAB — GLUCOSE, CAPILLARY
Glucose-Capillary: 129 mg/dL — ABNORMAL HIGH (ref 70–99)
Glucose-Capillary: 162 mg/dL — ABNORMAL HIGH (ref 70–99)

## 2019-07-15 LAB — CREATININE, SERUM
Creatinine, Ser: 1.33 mg/dL — ABNORMAL HIGH (ref 0.44–1.00)
GFR calc Af Amer: 54 mL/min — ABNORMAL LOW (ref >60)
GFR calc non Af Amer: 46 mL/min — ABNORMAL LOW (ref >60)

## 2019-07-15 MED ORDER — HEPARIN SODIUM (PORCINE) 5000 UNIT/ML IJ SOLN
5000.0000 [IU] | Freq: Three times a day (TID) | INTRAMUSCULAR | Status: DC
  Administered 2019-07-15: 5000 [IU] via SUBCUTANEOUS
  Filled 2019-07-15: qty 1

## 2019-07-15 MED ORDER — HEPARIN SOD (PORK) LOCK FLUSH 100 UNIT/ML IV SOLN
500.0000 [IU] | INTRAVENOUS | Status: AC | PRN
  Administered 2019-07-15: 500 [IU]
  Filled 2019-07-15: qty 5

## 2019-07-15 MED ORDER — LINEZOLID 600 MG PO TABS: 600.0000 mg | ORAL_TABLET | Freq: Two times a day (BID) | ORAL | 0 refills | Status: DC

## 2019-07-15 NOTE — Progress Notes (Signed)
[facility]/Trauma Progress Note      Assessment/Plan Principal Problem:   Abscess of breast, right Active Problems:   Human immunodeficiency virus (HIV) disease ([place])   Cigarette smoker   Essential hypertension   Asthma   Hidradenitis suppurativa of right >> left axilla   Non-insulin dependent type 2 diabetes mellitus (HCC)   Chronic anxiety   Hyperlipidemia   Malignant neoplasm of lower-inner quadrant of right breast of female, estrogen receptor positive (HCC)   Cellulitis of breast   Cellulitis   CKD (chronic kidney disease) stage 3, GFR 30-59 ml/min (HCC)   Normocytic anemia  Hx of R breast cancer, S/P lumpectomy Dr. Brantley Stage 2019, s/p chemo and radiation R breast abscess - US showed right mastitis with R breast retroareolar abscess/abscesses - S/P IR drain 09/27, purulent fluid drained, gram + cocci, culture pending - pt is feeling better so formal OR I&D might be avoided this admission.  - appreciate ID's recs and will allow them to guide antibiotic therapy - pt will need close f/u with Dr. Brantley Stage  FEN: carb mod VTE: SCD's, heparin  ID: Vancomycin 09/24>>, DESCOVY & [name], WBC 7.4, afebrile Follow up: Dr. Brantley Stage     LOS: 4 days    Subjective: CC: R breast pain  Pt states her breast pain is improved today. She is feeling better. Afebrile overnight.   Objective: Vital signs in last 24 hours: Temp:  [98.2 F (36.8 C)-98.8 F (37.1 C)] 98.2 F (36.8 C) (09/28 0543) Pulse Rate:  [61-69] 67 (09/28 0543) Resp:  [10-20] 18 (09/28 0543) BP: (122-146)/(67-88) 146/88 (09/28 0543) SpO2:  [89 %-99 %] 92 % (09/28 0543) Last BM Date: 07/15/19  Intake/Output from previous day: 09/27 0701 - 09/28 0700 In: 620.4 [P.O.:240; I.V.:10.5; IV Piggyback:370] Out: -  Intake/Output this shift: Total I/O In: 240 [P.O.:240] Out: -   PE: Gen:  Alert, NAD, pleasant, cooperative Pulm:  Rate and effort normal, work of breathing non labored Breast: R breast  with erythema and induration surrounding areola, scant purulent drainage, TTP Skin: no rashes noted, warm and dry   Anti-infectives: Anti-infectives (From admission, onward)   Start     Dose/Rate Route Frequency Ordered Stop   07/14/19 1200  vancomycin (VANCOCIN) IVPB 1000 mg/200 mL premix     1,000 mg 200 mL/hr over 60 Minutes Intravenous Every 24 hours 07/13/19 1417     07/13/19 0600  vancomycin (VANCOCIN) 1,250 mg in sodium chloride 0.9 % 250 mL IVPB  Status:  Discontinued     1,250 mg 166.7 mL/hr over 90 Minutes Intravenous Every 36 hours 07/11/19 1753 07/13/19 1417   07/11/19 1830  emtricitabine-tenofovir AF (DESCOVY) 200-25 MG per tablet 1 tablet     1 tablet Oral Daily 07/11/19 1738     07/11/19 1830  dolutegravir ([name]) tablet 50 mg     50 mg Oral Daily 07/11/19 1738     07/11/19 1800  vancomycin (VANCOCIN) 1,250 mg in sodium chloride 0.9 % 250 mL IVPB     1,250 mg 166.7 mL/hr over 90 Minutes Intravenous STAT 07/11/19 1751 07/11/19 2241   07/11/19 1745  vancomycin (VANCOCIN) IVPB 1000 mg/200 mL premix  Status:  Discontinued     1,000 mg 200 mL/hr over 60 Minutes Intravenous  Once 07/11/19 1735 07/11/19 1747   07/11/19 1330  vancomycin (VANCOCIN) IVPB 1000 mg/200 mL premix     1,000 mg 200 mL/hr over 60 Minutes Intravenous  Once 07/11/19 1317 07/11/19 1508      Lab  Results:  Recent Labs    07/14/19 0312  WBC 7.4  HGB 10.9*  HCT 33.8*  PLT 161   BMET Recent Labs    07/13/19 0500 07/14/19 0312 07/15/19 0320  NA 139 141  --   K 3.4* 3.5  --   CL 105 106  --   CO2 25 25  --   GLUCOSE 132* 130*  --   BUN 23* 21*  --   CREATININE 1.54*  1.56* 1.48* 1.33*  CALCIUM 8.4* 8.6*  --    PT/INR Recent Labs    07/14/19 0312  LABPROT 13.0  INR 1.0   CMP     Component Value Date/Time   NA 141 07/14/2019 0312   K 3.5 07/14/2019 0312   CL 106 07/14/2019 0312   CO2 25 07/14/2019 0312   GLUCOSE 130 (H) 07/14/2019 0312   BUN 21 (H) 07/14/2019 0312    CREATININE 1.33 (H) 07/15/2019 0320   CREATININE 1.28 (H) 05/10/2018 1458   CREATININE 1.02 09/23/2015 1047   CALCIUM 8.6 (L) 07/14/2019 0312   PROT 7.2 07/14/2019 0312   ALBUMIN 2.7 (L) 07/14/2019 0312   AST 40 07/14/2019 0312   AST 28 05/10/2018 1458   ALT 25 07/14/2019 0312   ALT 23 05/10/2018 1458   ALKPHOS 78 07/14/2019 0312   BILITOT 0.3 07/14/2019 0312   BILITOT <0.2 (L) 05/10/2018 1458   GFRNONAA 46 (L) 07/15/2019 0320   GFRNONAA 48 (L) 05/10/2018 1458   GFRAA 54 (L) 07/15/2019 0320   GFRAA 56 (L) 05/10/2018 1458   Lipase     Component Value Date/Time   LIPASE 19 01/23/2008 1418    Studies/Results: US Guided Needle Placement  Result Date: 07/14/2019 INDICATION: Right retro-areolar breast abscess. EXAM: ULTRASOUND-GUIDED ASPIRATION DRAINAGE OF RIGHT BREAST ABSCESS MEDICATIONS: The patient is currently admitted to the hospital and receiving intravenous antibiotics. The antibiotics were administered within an appropriate time frame prior to the initiation of the procedure. ANESTHESIA/SEDATION: Fentanyl 2.0 mcg IV; Versed 100 mg IV Moderate Sedation Time:  16 minutes. The patient was continuously monitored during the procedure by the interventional radiology nurse under my direct supervision. COMPLICATIONS: None immediate. PROCEDURE: Informed written consent was obtained from the patient after a thorough discussion of the procedural risks, benefits and alternatives. All questions were addressed. Maximal Sterile Barrier Technique was utilized including caps, mask, sterile gowns, sterile gloves, sterile drape, hand hygiene and skin antiseptic. A timeout was performed prior to the initiation of the procedure. Ultrasound was performed of the right breast. Overlying skin was prepped with chlorhexidine. Local anesthesia was provided with 1% lidocaine. Under direct ultrasound guidance, a 5 French centesis catheter was advanced over a coaxial 19 gauge needle into a complex fluid  collection/abscess. Aspiration was performed. A sample of fluid was sent for culture analysis. Additional saline lavage was performed in the cavity with a total of 10 mL of sterile saline. Injected sterile saline was completely aspirated. The centesis catheter was removed. FINDINGS: Complex retro-areolar collection measures approximately 4.6 x 1.5 x 3.7 cm by ultrasound. Aspiration yielded grossly purulent fluid. After aspiration of approximately 8 mL of purulent fluid, there was no longer any free return of fluid. Additional saline lavage was performed in the abscess cavity with sterile saline. Postprocedural ultrasound demonstrates significant improvement with decompression of the abscess and some residual soft tissue thickening and small amount of septated fluid remaining. IMPRESSION: Ultrasound-guided aspiration of right retro-areolar breast abscess yielding grossly purulent fluid. Approximately 8 mL of purulent fluid  was aspirated and sent for culture analysis. Additional sterile saline lavage was performed in a complex and septated abscess cavity. Electronically Signed   By: Aletta Edouard M.D.   On: 07/14/2019 15:33     Kalman Drape, [facility] Pager [phone] Cristine Polio, & Friday 7:00am - 4:30pm Thursdays 7:00am -11:30am  Consults: [phone]

## 2019-07-15 NOTE — Progress Notes (Signed)
PROGRESS NOTE    [name]  NUU:[id number] DOB: May 20, 1966 DOA: 07/11/2019 PCP: Felicia Koch, NP     Brief Narrative:  [name] is a72 y.o.femalewith medical history significant ofbreast cancer s/p chemo and followed by oncology, DM, HIV, HTN presents to ED with complaints of worsening R breast pain, tenderness, and swelling after she tried to pop a pimple/blackhead 2 week PTA. Area then became more inflamed with increased pain,swelling and she noted dark colored drainage She also reported subjective fevers, chills, sob. Pt already on oral daily doxycyline for tx of hidradenitis.Recently completed 5 day course of oral cipro for UTI (8/28).  She was started on empiric IV vancomycin.  General surgery, infectious disease as well as IR were consulted.  She underwent ultrasound-guided aspiration of right breast abscess with IR 9/27.  New events last 24 hours / Subjective: Doing well, asking when she can go home.  Denies any fevers, chills, nausea, vomiting.  Assessment & Plan:   Principal Problem:   Abscess of breast, right Active Problems:   Human immunodeficiency virus (HIV) disease ([place])   Cigarette smoker   Essential hypertension   Asthma   Hidradenitis suppurativa of right >> left axilla   Non-insulin dependent type 2 diabetes mellitus (HCC)   Chronic anxiety   Hyperlipidemia   Malignant neoplasm of lower-inner quadrant of right breast of female, estrogen receptor positive ([place])   Cellulitis of breast   Cellulitis   CKD (chronic kidney disease) stage 3, GFR 30-59 ml/min (HCC)   Normocytic anemia   Right breast mastitis with retroareolar abscesses -US soft tissue chest shows retroareolar complex collection measuring 2.3 x 2.1 cm with an adjacent similar appearing collection measuring 1.6 x 1.1 cm -Status post ultrasound-guided aspiration of abscess 9/27 -General surgery, infectious disease following -Culture pending -Continue empiric vancomycin   Right breast cancer -Status post surgery and chemotherapy -Oncology following -Continue tamoxifen  HIV -Continue Descovy and [name]  Hypertension -Continue Norvasc, HCTZ, Avapro, Toprol, Aldactone  Diabetes mellitus, well controlled  -Hemoglobin A1c 7.1 on 8/19 -Sliding-scale insulin  Hypothyroidism -Continue Synthroid   CKD stage III -Baseline creatinine 1.4-1.7 -Stable  Hyperlipidemia -Continue Crestor  Chronic hidradenitis of right axilla -On doxycycline at home    DVT prophylaxis: Subcutaneous heparin Code Status: Full code Family Communication: None Disposition Plan: Pending clinical improvement, culture results   Consultants:   General surgery  Infectious disease  Interventional radiology  Oncology  Procedures:   Status post ultrasound-guided aspiration of right breast abscess 9/27 with Dr. Kathlene Cote  Antimicrobials:  Anti-infectives (From admission, onward)   Start     Dose/Rate Route Frequency Ordered Stop   07/14/19 1200  vancomycin (VANCOCIN) IVPB 1000 mg/200 mL premix     1,000 mg 200 mL/hr over 60 Minutes Intravenous Every 24 hours 07/13/19 1417     07/13/19 0600  vancomycin (VANCOCIN) 1,250 mg in sodium chloride 0.9 % 250 mL IVPB  Status:  Discontinued     1,250 mg 166.7 mL/hr over 90 Minutes Intravenous Every 36 hours 07/11/19 1753 07/13/19 1417   07/11/19 1830  emtricitabine-tenofovir AF (DESCOVY) 200-25 MG per tablet 1 tablet     1 tablet Oral Daily 07/11/19 1738     07/11/19 1830  dolutegravir ([name]) tablet 50 mg     50 mg Oral Daily 07/11/19 1738     07/11/19 1800  vancomycin (VANCOCIN) 1,250 mg in sodium chloride 0.9 % 250 mL IVPB     1,250 mg 166.7 mL/hr over 90 Minutes Intravenous  STAT 07/11/19 1751 07/11/19 2241   07/11/19 1745  vancomycin (VANCOCIN) IVPB 1000 mg/200 mL premix  Status:  Discontinued     1,000 mg 200 mL/hr over 60 Minutes Intravenous  Once 07/11/19 1735 07/11/19 1747   07/11/19 1330  vancomycin (VANCOCIN)  IVPB 1000 mg/200 mL premix     1,000 mg 200 mL/hr over 60 Minutes Intravenous  Once 07/11/19 1317 07/11/19 1508        Objective: Vitals:   07/14/19 1422 07/14/19 1553 07/14/19 2001 07/15/19 0543  BP: 128/72 127/67 127/80 (!) 146/88  Pulse: 69 67 68 67  Resp: 15 20 18 18   Temp:  98.8 F (37.1 C) 98.2 F (36.8 C) 98.2 F (36.8 C)  TempSrc:  Oral Oral Oral  SpO2: 96% (!) 89% 95% 92%  Weight:      Height:        Intake/Output Summary (Last 24 hours) at 07/15/2019 1226 Last data filed at 07/15/2019 0800 Gross per 24 hour  Intake 860.44 ml  Output -  Net 860.44 ml   Filed Weights   07/12/19 0532  Weight: 106.1 kg    Examination:  General exam: Appears calm and comfortable  Respiratory system: Clear to auscultation. Respiratory effort normal. No respiratory distress. No conversational dyspnea.  Cardiovascular system: S1 & S2 heard, RRR. No murmurs. No pedal edema. Gastrointestinal system: Abdomen is nondistended, soft and nontender. Normal bowel sounds heard. Central nervous system: Alert and oriented. No focal neurological deficits. Speech clear.  Extremities: Symmetric in appearance  Skin: Right breast with warmth, minimal erythema, no drainage noted on gauze Psychiatry: Judgement and insight appear normal. Mood & affect appropriate.    Data Reviewed: I have personally reviewed following labs and imaging studies  CBC: Recent Labs  Lab 07/11/19 1400 07/12/19 0532 07/14/19 0312  WBC 7.9 7.9 7.4  NEUTROABS 5.2  --  4.5  HGB 11.7* 11.4* 10.9*  HCT 36.0 35.2* 33.8*  MCV 93.5 94.1 94.7  PLT 158 161 657   Basic Metabolic Panel: Recent Labs  Lab 07/11/19 1400 07/12/19 0532 07/13/19 0500 07/14/19 0312 07/15/19 0320  NA 139 140 139 141  --   K 3.6 3.6 3.4* 3.5  --   CL 103 105 105 106  --   CO2 26 26 25 25   --   GLUCOSE 238* 121* 132* 130*  --   BUN 33* 27* 23* 21*  --   CREATININE 1.79* 1.55* 1.54*  1.56* 1.48* 1.33*  CALCIUM 8.8* 8.6* 8.4* 8.6*  --     GFR: Estimated Creatinine Clearance: 60.5 mL/min (A) (by C-G formula based on SCr of 1.33 mg/dL (H)). Liver Function Tests: Recent Labs  Lab 07/11/19 1400 07/12/19 0532 07/14/19 0312  AST 34 33 40  ALT 21 21 25   ALKPHOS 79 81 78  BILITOT 0.2* 0.4 0.3  PROT 7.8 7.7 7.2  ALBUMIN 3.0* 2.9* 2.7*   No results for input(s): LIPASE, AMYLASE in the last 168 hours. No results for input(s): AMMONIA in the last 168 hours. Coagulation Profile: Recent Labs  Lab 07/14/19 0312  INR 1.0   Cardiac Enzymes: No results for input(s): CKTOTAL, CKMB, CKMBINDEX, TROPONINI in the last 168 hours. BNP (last 3 results) No results for input(s): PROBNP in the last 8760 hours. HbA1C: No results for input(s): HGBA1C in the last 72 hours. CBG: Recent Labs  Lab 07/14/19 1201 07/14/19 1656 07/14/19 2010 07/15/19 0745 07/15/19 1158  GLUCAP 129* 250* 203* 129* 162*   Lipid Profile: No results for  input(s): CHOL, HDL, LDLCALC, TRIG, CHOLHDL, LDLDIRECT in the last 72 hours. Thyroid Function Tests: No results for input(s): TSH, T4TOTAL, FREET4, T3FREE, THYROIDAB in the last 72 hours. Anemia Panel: No results for input(s): VITAMINB12, FOLATE, FERRITIN, TIBC, IRON, RETICCTPCT in the last 72 hours. Sepsis Labs: No results for input(s): PROCALCITON, LATICACIDVEN in the last 168 hours.  Recent Results (from the past 240 hour(s))  SARS CORONAVIRUS 2 (TAT 6-24 HRS) Nasopharyngeal Nasopharyngeal Swab     Status: None   Collection Time: 07/11/19  2:00 PM   Specimen: Nasopharyngeal Swab  Result Value Ref Range Status   SARS Coronavirus 2 NEGATIVE NEGATIVE Final    Comment: (NOTE) SARS-CoV-2 target nucleic acids are NOT DETECTED. The SARS-CoV-2 RNA is generally detectable in upper and lower respiratory specimens during the acute phase of infection. Negative results do not preclude SARS-CoV-2 infection, do not rule out co-infections with other pathogens, and should not be used as the sole basis for  treatment or other patient management decisions. Negative results must be combined with clinical observations, patient history, and epidemiological information. The expected result is Negative. Fact Sheet for Patients: SugarRoll.be Fact Sheet for Healthcare Providers: https://www.woods-mathews.com/ This test is not yet approved or cleared by the [place] FDA and  has been authorized for detection and/or diagnosis of SARS-CoV-2 by FDA under an Emergency Use Authorization (EUA). This EUA will remain  in effect (meaning this test can be used) for the duration of the COVID-19 declaration under Section 56 4(b)(1) of the Act, 21 U.S.C. section 360bbb-3(b)(1), unless the authorization is terminated or revoked sooner. Performed at [facility], [place] [address], [place], [place] [address]   Body fluid culture     Status: None (Preliminary result)   Collection Time: 07/14/19  2:20 PM   Specimen: Abscess  Result Value Ref Range Status   Specimen Description   Final    ABSCESS RIGHT BREAST Performed at [facility] [address], [place], [place] [address]    Special Requests   Final    NONE Performed at [facility], [place] [address] [place], [place] [address]    Gram Stain   Final    ABUNDANT WBC PRESENT, PREDOMINANTLY PMN ABUNDANT GRAM POSITIVE COCCI    Culture   Final    NO GROWTH < 24 HOURS Performed at [facility], [place] [address], [place],  [address]    Report Status PENDING  Incomplete      Radiology Studies: US Guided Needle Placement  Result Date: 07/14/2019 INDICATION: Right retro-areolar breast abscess. EXAM: ULTRASOUND-GUIDED ASPIRATION DRAINAGE OF RIGHT BREAST ABSCESS MEDICATIONS: The patient is currently admitted to the hospital and receiving intravenous antibiotics. The antibiotics were administered within an appropriate time frame prior to the initiation of the  procedure. ANESTHESIA/SEDATION: Fentanyl 2.0 mcg IV; Versed 100 mg IV Moderate Sedation Time:  16 minutes. The patient was continuously monitored during the procedure by the interventional radiology nurse under my direct supervision. COMPLICATIONS: None immediate. PROCEDURE: Informed written consent was obtained from the patient after a thorough discussion of the procedural risks, benefits and alternatives. All questions were addressed. Maximal Sterile Barrier Technique was utilized including caps, mask, sterile gowns, sterile gloves, sterile drape, hand hygiene and skin antiseptic. A timeout was performed prior to the initiation of the procedure. Ultrasound was performed of the right breast. Overlying skin was prepped with chlorhexidine. Local anesthesia was provided with 1% lidocaine. Under direct ultrasound guidance, a 5 French centesis catheter  was advanced over a coaxial 19 gauge needle into a complex fluid collection/abscess. Aspiration was performed. A sample of fluid was sent for culture analysis. Additional saline lavage was performed in the cavity with a total of 10 mL of sterile saline. Injected sterile saline was completely aspirated. The centesis catheter was removed. FINDINGS: Complex retro-areolar collection measures approximately 4.6 x 1.5 x 3.7 cm by ultrasound. Aspiration yielded grossly purulent fluid. After aspiration of approximately 8 mL of purulent fluid, there was no longer any free return of fluid. Additional saline lavage was performed in the abscess cavity with sterile saline. Postprocedural ultrasound demonstrates significant improvement with decompression of the abscess and some residual soft tissue thickening and small amount of septated fluid remaining. IMPRESSION: Ultrasound-guided aspiration of right retro-areolar breast abscess yielding grossly purulent fluid. Approximately 8 mL of purulent fluid was aspirated and sent for culture analysis. Additional sterile saline lavage was  performed in a complex and septated abscess cavity. Electronically Signed   By: Aletta Edouard M.D.   On: 07/14/2019 15:33      Scheduled Meds: . amLODipine  10 mg Oral Daily  . Chlorhexidine Gluconate Cloth  6 each Topical Daily  . dolutegravir  50 mg Oral Daily  . emtricitabine-tenofovir AF  1 tablet Oral Daily  . heparin injection (subcutaneous)  5,000 Units Subcutaneous Q8H  . hydrochlorothiazide  12.5 mg Oral Daily  . insulin aspart  0-15 Units Subcutaneous TID WC  . insulin aspart  0-5 Units Subcutaneous QHS  . irbesartan  150 mg Oral Daily  . levothyroxine  175 mcg Oral Q0600  . metoprolol succinate  50 mg Oral Daily  . rosuvastatin  10 mg Oral Daily  . sodium chloride flush  10-40 mL Intracatheter Q12H  . spironolactone  25 mg Oral Daily  . tamoxifen  20 mg Oral Daily   Continuous Infusions: . vancomycin 1,000 mg (07/14/19 1216)     LOS: 4 days      Time spent: 35 minutes   Dessa Phi, DO [facility] 07/15/2019, 12:26 PM   Available via Epic secure chat 7am-7pm After these hours, please refer to coverage provider listed on [email or web address]

## 2019-07-15 NOTE — Progress Notes (Signed)
Patient ID: [name], female   DOB: 12/29/1965, 53 y.o.   MRN: [id number]         [facility]  Date of Admission:  07/11/2019           Day 5 vancomycin ASSESSMENT: [name] is improving on therapy for a breast abscess and cellulitis.  Her abscess was aspirated yesterday.  Gram stain shows gram-positive cocci and cultures are pending.  I favor transitioning to oral linezolid pending final cultures.  PLAN: 1. Recommend discharge home on linezolid 600 mg mouth twice daily for 2 weeks 2. Monitor final culture results 3. Continue Descovy and [name] 4. Await results of repeat HIV viral load 5. She will follow-up with me on 07/23/2019 6. I will sign off now  Principal Problem:   Abscess of breast, right Active Problems:   Cellulitis of breast   Human immunodeficiency virus (HIV) disease ([place])   Cigarette smoker   Essential hypertension   Asthma   Hidradenitis suppurativa of right >> left axilla   Malignant neoplasm of lower-inner quadrant of right breast of female, estrogen receptor positive (HCC)   Non-insulin dependent type 2 diabetes mellitus (HCC)   Chronic anxiety   Hyperlipidemia   Cellulitis   CKD (chronic kidney disease) stage 3, GFR 30-59 ml/min (HCC)   Normocytic anemia   Scheduled Meds:  amLODipine  10 mg Oral Daily   Chlorhexidine Gluconate Cloth  6 each Topical Daily   dolutegravir  50 mg Oral Daily   emtricitabine-tenofovir AF  1 tablet Oral Daily   heparin injection (subcutaneous)  5,000 Units Subcutaneous Q8H   hydrochlorothiazide  12.5 mg Oral Daily   insulin aspart  0-15 Units Subcutaneous TID WC   insulin aspart  0-5 Units Subcutaneous QHS   irbesartan  150 mg Oral Daily   levothyroxine  175 mcg Oral Q0600   metoprolol succinate  50 mg Oral Daily   rosuvastatin  10 mg Oral Daily   sodium chloride flush  10-40 mL Intracatheter Q12H   spironolactone  25 mg Oral Daily   tamoxifen  20 mg Oral Daily    Continuous Infusions:  vancomycin 1,000 mg (07/15/19 1257)   PRN Meds:.acetaminophen **OR** acetaminophen, morphine injection, sodium chloride flush   SUBJECTIVE: She is feeling much better today and having less right breast pain.  Review of Systems: Review of Systems  Constitutional: Negative for fever.  Gastrointestinal: Negative for abdominal pain, diarrhea, nausea and vomiting.    Allergies  Allergen Reactions   Genvoya [Elviteg-Cobic-Emtricit-Tenofaf] Hives   Lisinopril Cough   Valsartan Other (See Comments) and Cough    Pt states she tolerates medicine now    OBJECTIVE: Vitals:   07/14/19 1422 07/14/19 1553 07/14/19 2001 07/15/19 0543  BP: 128/72 127/67 127/80 (!) 146/88  Pulse: 69 67 68 67  Resp: 15 20 18 18   Temp:  98.8 F (37.1 C) 98.2 F (36.8 C) 98.2 F (36.8 C)  TempSrc:  Oral Oral Oral  SpO2: 96% (!) 89% 95% 92%  Weight:      Height:       Body mass index is 38.94 kg/m.  Physical Exam Constitutional:      Comments: She is smiling and in good spirits as usual.  Chest:       Lab Results Lab Results  Component Value Date   WBC 7.4 07/14/2019   HGB 10.9 (L) 07/14/2019   HCT 33.8 (L) 07/14/2019   MCV 94.7 07/14/2019   PLT 161 07/14/2019  Lab Results  Component Value Date   CREATININE 1.33 (H) 07/15/2019   BUN 21 (H) 07/14/2019   NA 141 07/14/2019   K 3.5 07/14/2019   CL 106 07/14/2019   CO2 25 07/14/2019    Lab Results  Component Value Date   ALT 25 07/14/2019   AST 40 07/14/2019   ALKPHOS 78 07/14/2019   BILITOT 0.3 07/14/2019     Microbiology: Recent Results (from the past 240 hour(s))  SARS CORONAVIRUS 2 (TAT 6-24 HRS) Nasopharyngeal Nasopharyngeal Swab     Status: None   Collection Time: 07/11/19  2:00 PM   Specimen: Nasopharyngeal Swab  Result Value Ref Range Status   SARS Coronavirus 2 NEGATIVE NEGATIVE Final    Comment: (NOTE) SARS-CoV-2 target nucleic acids are NOT DETECTED. The SARS-CoV-2 RNA is generally  detectable in upper and lower respiratory specimens during the acute phase of infection. Negative results do not preclude SARS-CoV-2 infection, do not rule out co-infections with other pathogens, and should not be used as the sole basis for treatment or other patient management decisions. Negative results must be combined with clinical observations, patient history, and epidemiological information. The expected result is Negative. Fact Sheet for Patients: SugarRoll.be Fact Sheet for Healthcare Providers: https://www.woods-mathews.com/ This test is not yet approved or cleared by the [place] FDA and  has been authorized for detection and/or diagnosis of SARS-CoV-2 by FDA under an Emergency Use Authorization (EUA). This EUA will remain  in effect (meaning this test can be used) for the duration of the COVID-19 declaration under Section 56 4(b)(1) of the Act, 21 U.S.C. section 360bbb-3(b)(1), unless the authorization is terminated or revoked sooner. Performed at [facility], [place] [address] [place], [place] [address]   Body fluid culture     Status: None (Preliminary result)   Collection Time: 07/14/19  2:20 PM   Specimen: Abscess  Result Value Ref Range Status   Specimen Description   Final    ABSCESS RIGHT BREAST Performed at [place] [address], [place], [place] [address]    Special Requests   Final    NONE Performed at [facility], [place] [address] [place], [place] [address]    Gram Stain   Final    ABUNDANT WBC PRESENT, PREDOMINANTLY PMN ABUNDANT GRAM POSITIVE COCCI    Culture   Final    NO GROWTH < 24 HOURS Performed at [facility], [place] [address], [place], [place] [address]    Report Status PENDING  Incomplete    Michel Bickers, MD [facility] [phone] pager   [phone] cell 07/15/2019, 1:06 PM

## 2019-07-15 NOTE — Progress Notes (Signed)
Pt alert and oriented. Tolerating diet. D/C instructions given, all questions answered. Pt d/cd to home.

## 2019-07-15 NOTE — Progress Notes (Addendum)
HEMATOLOGY-ONCOLOGY PROGRESS NOTE  SUBJECTIVE: [name] was admitted secondary to right breast abscess.  She states that at home, she had a fever and chills.  She reported that her right breast was reddened and very tender to touch.  She has been placed on antibiotics and reports improvement of her symptoms.  She underwent an ultrasound-guided aspiration of her right breast abscess on 07/14/2019.  Culture with abundant gram-positive cocci.  Sensitivities are pending.  Has remained afebrile over the past 24 hours.  She has no other complaints today.  Oncology History  Malignant neoplasm of lower-inner quadrant of right breast of female, estrogen receptor positive ([place])  05/10/2018 Initial Diagnosis   Malignant neoplasm of lower-inner quadrant of right breast of female, estrogen receptor positive ([place])   05/31/2018 - 11/12/2018 Chemotherapy   The patient had palonosetron (ALOXI) injection 0.25 mg, 0.25 mg, Intravenous,  Once, 6 of 6 cycles Administration: 0.25 mg (05/31/2018), 0.25 mg (06/21/2018), 0.25 mg (08/23/2018), 0.25 mg (09/14/2018), 0.25 mg (07/12/2018), 0.25 mg (08/02/2018) pegfilgrastim-cbqv (UDENYCA) injection 6 mg, 6 mg, Subcutaneous, Once, 6 of 6 cycles Administration: 6 mg (06/02/2018), 6 mg (06/23/2018), 6 mg (08/25/2018), 6 mg (09/17/2018), 6 mg (07/14/2018), 6 mg (08/04/2018) trastuzumab (HERCEPTIN) 900 mg in sodium chloride 0.9 % 250 mL chemo infusion, 903 mg, Intravenous,  Once, 7 of 7 cycles Administration: 672 mg (06/21/2018), 672 mg (08/23/2018), 672 mg (09/14/2018), 900 mg (06/01/2018), 672 mg (07/12/2018), 672 mg (08/02/2018), 672 mg (10/23/2018) CARBOplatin (PARAPLATIN) 580 mg in sodium chloride 0.9 % 250 mL chemo infusion, 580 mg (98.8 % of original dose 588 mg), Intravenous,  Once, 6 of 6 cycles Dose modification:   (original dose 588 mg, Cycle 1) Administration: 580 mg (05/31/2018), 500 mg (06/21/2018), 560 mg (08/23/2018), 560 mg (09/14/2018), 530 mg (07/12/2018), 570 mg (08/02/2018) DOCEtaxel  (TAXOTERE) 170 mg in sodium chloride 0.9 % 250 mL chemo infusion, 75 mg/m2 = 170 mg, Intravenous,  Once, 4 of 4 cycles Administration: 170 mg (05/31/2018), 170 mg (06/21/2018), 170 mg (07/12/2018), 170 mg (08/02/2018) gemcitabine (GEMZAR) 1,824 mg in sodium chloride 0.9 % 100 mL chemo infusion, 800 mg/m2 = 1,824 mg (100 % of original dose 800 mg/m2), Intravenous,  Once, 2 of 2 cycles Dose modification: 800 mg/m2 (original dose 800 mg/m2, Cycle 5, Reason: Provider Judgment) Administration: 1,824 mg (08/23/2018), 1,824 mg (09/14/2018) pertuzumab (PERJETA) 840 mg in sodium chloride 0.9 % 250 mL chemo infusion, 840 mg, Intravenous, Once, 1 of 1 cycle Administration: 840 mg (06/01/2018) fosaprepitant (EMEND) 150 mg, dexamethasone (DECADRON) 12 mg in sodium chloride 0.9 % 145 mL IVPB, , Intravenous,  Once, 6 of 6 cycles Administration:  (05/31/2018),  (06/21/2018),  (08/23/2018),  (09/14/2018),  (07/12/2018),  (08/02/2018)  for chemotherapy treatment.    11/13/2018 -  Chemotherapy   The patient had ado-trastuzumab emtansine (KADCYLA) 360 mg in sodium chloride 0.9 % 250 mL chemo infusion, 380 mg, Intravenous, Once, 10 of 11 cycles Administration: 360 mg (11/13/2018), 360 mg (12/06/2018), 360 mg (02/07/2019), 360 mg (12/27/2018), 360 mg (01/17/2019), 360 mg (02/28/2019), 360 mg (04/12/2019), 360 mg (03/22/2019), 360 mg (05/03/2019), 360 mg (07/02/2019)  for chemotherapy treatment.       REVIEW OF SYSTEMS:   Constitutional: Fevers and chills have resolved. Respiratory: Denies cough, dyspnea or wheezes Cardiovascular: Denies palpitation, chest discomfort Gastrointestinal:  Denies nausea, heartburn or change in bowel habits Skin: Denies abnormal skin rashes Lymphatics: Denies new lymphadenopathy or easy bruising Neurological:Denies numbness, tingling or new weaknesses Behavioral/Psych: Mood is stable, no new changes  Extremities: No lower  extremity edema Breast: Right breast was reddened and tender to touch prior to  admission.  Significantly improved. All other systems were reviewed with the patient and are negative.  I have reviewed the past medical history, past surgical history, social history and family history with the patient and they are unchanged from previous note.   PHYSICAL EXAMINATION: ECOG PERFORMANCE STATUS: 1 - Symptomatic but completely ambulatory  Vitals:   07/14/19 2001 07/15/19 0543  BP: 127/80 (!) 146/88  Pulse: 68 67  Resp: 18 18  Temp: 98.2 F (36.8 C) 98.2 F (36.8 C)  SpO2: 95% 92%   Filed Weights   07/12/19 0532  Weight: 234 lb (106.1 kg)    Intake/Output from previous day: 09/27 0701 - 09/28 0700 In: 620.4 [P.O.:240; I.V.:10.5; IV Piggyback:370] Out: -   GENERAL:alert, no distress and comfortable SKIN: No rashes or ecchymosis.  Right breast with erythema and induration surrounding the areola and a small amount of purulent drainage. EYES: normal, Conjunctiva are pink and non-injected, sclera clear OROPHARYNX:no exudate, no erythema and lips, buccal mucosa, and tongue normal  NECK: supple, thyroid normal size, non-tender, without nodularity LYMPH:  no palpable lymphadenopathy in the cervical, axillary or inguinal LUNGS: clear to auscultation and percussion with normal breathing effort HEART: regular rate & rhythm and no murmurs and no lower extremity edema ABDOMEN:abdomen soft, non-tender and normal bowel sounds Musculoskeletal:no cyanosis of digits and no clubbing  NEURO: alert & oriented x 3 with fluent speech, no focal motor/sensory deficits  LABORATORY DATA:  I have reviewed the data as listed CMP Latest Ref Rng & Units 07/15/2019 07/14/2019 07/13/2019  Glucose 70 - 99 mg/dL - 130(H) 132(H)  BUN 6 - 20 mg/dL - 21(H) 23(H)  Creatinine 0.44 - 1.00 mg/dL 1.33(H) 1.48(H) 1.54(H)  Sodium 135 - 145 mmol/L - 141 139  Potassium 3.5 - 5.1 mmol/L - 3.5 3.4(L)  Chloride 98 - 111 mmol/L - 106 105  CO2 22 - 32 mmol/L - 25 25  Calcium 8.9 - 10.3 mg/dL - 8.6(L) 8.4(L)   Total Protein 6.5 - 8.1 g/dL - 7.2 -  Total Bilirubin 0.3 - 1.2 mg/dL - 0.3 -  Alkaline Phos 38 - 126 U/L - 78 -  AST 15 - 41 U/L - 40 -  ALT 0 - 44 U/L - 25 -    Lab Results  Component Value Date   WBC 7.4 07/14/2019   HGB 10.9 (L) 07/14/2019   HCT 33.8 (L) 07/14/2019   MCV 94.7 07/14/2019   PLT 161 07/14/2019   NEUTROABS 4.5 07/14/2019    Dg Chest 2 View  Result Date: 06/28/2019 CLINICAL DATA:  Dyspnea EXAM: CHEST - 2 VIEW COMPARISON:  05/24/2019, CT chest, 09/27/2018 FINDINGS: The heart size and mediastinal contours are within normal limits. Left chest port catheter. Both lungs are clear. Unchanged, subtle sclerotic focus of the right fifth rib, in keeping with lesion as seen on prior CT. IMPRESSION: No acute abnormality of the lungs. Electronically Signed   By: Eddie Candle M.D.   On: 06/28/2019 16:01   US Guided Needle Placement  Result Date: 07/14/2019 INDICATION: Right retro-areolar breast abscess. EXAM: ULTRASOUND-GUIDED ASPIRATION DRAINAGE OF RIGHT BREAST ABSCESS MEDICATIONS: The patient is currently admitted to the hospital and receiving intravenous antibiotics. The antibiotics were administered within an appropriate time frame prior to the initiation of the procedure. ANESTHESIA/SEDATION: Fentanyl 2.0 mcg IV; Versed 100 mg IV Moderate Sedation Time:  16 minutes. The patient was continuously monitored during the procedure by the  interventional radiology nurse under my direct supervision. COMPLICATIONS: None immediate. PROCEDURE: Informed written consent was obtained from the patient after a thorough discussion of the procedural risks, benefits and alternatives. All questions were addressed. Maximal Sterile Barrier Technique was utilized including caps, mask, sterile gowns, sterile gloves, sterile drape, hand hygiene and skin antiseptic. A timeout was performed prior to the initiation of the procedure. Ultrasound was performed of the right breast. Overlying skin was prepped with  chlorhexidine. Local anesthesia was provided with 1% lidocaine. Under direct ultrasound guidance, a 5 French centesis catheter was advanced over a coaxial 19 gauge needle into a complex fluid collection/abscess. Aspiration was performed. A sample of fluid was sent for culture analysis. Additional saline lavage was performed in the cavity with a total of 10 mL of sterile saline. Injected sterile saline was completely aspirated. The centesis catheter was removed. FINDINGS: Complex retro-areolar collection measures approximately 4.6 x 1.5 x 3.7 cm by ultrasound. Aspiration yielded grossly purulent fluid. After aspiration of approximately 8 mL of purulent fluid, there was no longer any free return of fluid. Additional saline lavage was performed in the abscess cavity with sterile saline. Postprocedural ultrasound demonstrates significant improvement with decompression of the abscess and some residual soft tissue thickening and small amount of septated fluid remaining. IMPRESSION: Ultrasound-guided aspiration of right retro-areolar breast abscess yielding grossly purulent fluid. Approximately 8 mL of purulent fluid was aspirated and sent for culture analysis. Additional sterile saline lavage was performed in a complex and septated abscess cavity. Electronically Signed   By: Aletta Edouard M.D.   On: 07/14/2019 15:33   Dg Chest Port 1 View  Result Date: 07/11/2019 CLINICAL DATA:  53 year old female with a history of breast pain on the right EXAM: PORTABLE CHEST 1 VIEW COMPARISON:  June 28, 2019 FINDINGS: Cardiomediastinal silhouette unchanged in size and contour. No pneumothorax. No pleural effusion. No confluent airspace disease. Unchanged left chest wall IJ port catheter. IMPRESSION: Negative for acute cardiopulmonary disease. Left chest wall port catheter unchanged Electronically Signed   By: Corrie Mckusick D.O.   On: 07/11/2019 13:33   [place] Chest Soft Tissue  Result Date: 07/11/2019 CLINICAL DATA:   Examination is of the right breast. Patient has nipple discharge and pain. She underwent a right lumpectomy in November 2019. EXAM: ULTRASOUND OF THE RIGHT BREAST COMPARISON:  None. FINDINGS: Targeted ultrasound is performed, showing diffuse edema and hyperechoic fat consistent with inflammation. In the retroareolar region there is a complex collection measuring 2.3 x 2.1 cm with an adjacent similar appearing collection measuring 1.6 x 1.1 cm. These findings are consistent with abscesses. IMPRESSION: Findings consistent with right mastitis with right breast retroareolar abscess/abscesses. RECOMMENDATION: 1. Recommend beginning antibiotic therapy targeting the typical breast infection causes. Consider doxycycline, Augmentin or clindamycin. 2. Follow-up diagnostic evaluation in the breast Center tomorrow or the first of next week for reassessment and probable abscess aspiration. I have discussed the findings and recommendations with the patient. If applicable, a reminder letter will be sent to the patient regarding the next appointment. BI-RADS CATEGORY  2: Benign. Electronically Signed   By: Lajean Manes M.D.   On: 07/11/2019 19:48    ASSESSMENT:  53 y.o. [place], [place] woman status post right breast upper inner quadrant biopsy 04/26/2018 for a clinical T2 N0, stage IB invasive ductal carcinoma, grade 2, ER/PR positive, HER-2 positive with an MIB-1 of 70%.  (1) genetics testing 07/03/2018: no pathogenic mutations.Genes Analyzed (43 total): APC*, ATM*, AXIN2, BARD1, BMPR1A, BRCA1*, BRCA2*, BRIP1*, CDH1*, CDK4, CDKN2A,  CHEK2*, DICER1,GALNT12, HOXB13, MEN1, MLH1*, MRE11A, MSH2*, MSH3, MSH6*, MUTYH*, NBN, NF1*, NTHL1, PALB2*, PMS2*, POLD1, POLE,PTEN*, RAD50, RAD51C*, RAD51D*, RET, SDHB, SDHD, SMAD4, SMARCA4, STK11 and TP53* (sequencing and deletion/duplication); EGFR (sequencing only); EPCAM and GREM1 (deletion/duplication only). DNA and RNA analyses performed for * genes.  (2) neoadjuvant chemotherapy  consisting of carboplatin, docetaxel, trastuzumab and pertuzumab given every 21 days x 6, starting 05/31/2018, completed 09/14/2018             (a) Pertuzumab discontinued starting with cycle 2 because of diarrhea problems             (b) Taxotere stopped after 4 cycles due peripheral neuropathy and substituted for last two cycles with gemcitabine  (3)  definitive surgery on 10/03/2018 showed residual ypT1c ypN0 invasive ductal carcinoma, grade 2, with negative margins; a total of 5 sentinel lymph nodes removed; repeat prognostic panel again triple positive  (4) T-DM1 given adjuvantly due to residual disease: started 11/13/2018.             (a) echo on 05/16/2018 shows an EF of 60-65%.             (b) echo on 08/27/18 shows an EF 55-60%             (c) echo on 12/03/2018 shows an EF of 60-65%             (d) echocardiogram on 03/06/2019 showed an ejection fraction of 55% as read by Dr. Haroldine Laws.             (e) echocardiogram 06/20/2019 shows an ejection fraction in the 55-60% range  (5) adjuvant radiation completed 01/07/2019 - 02/21/2019 (delayed due to wound healing issues)  Site/dose:The patient initially received a dose of 50.4 Gy in 28 fractions to the rightbreast using whole-breast tangent fields. This was delivered using a 3-D conformal technique. The patient then received a boost to the seroma. This delivered an additional 10 Gy in 5 fractions using 12e electrons with a special teletherapy technique. The total dose was 60.4 Gy.  (6) Tamoxifen started 02/28/2019  (7) hidradenitis/boils: on doxycycline daily  (8) hospital admission on 07/11/2019 for right breast abscess   PLAN: Kerrilyn's right breast abscess seems to be improving.  She remains on IV antibiotics and hopefully she can transition over to oral antibiotics in the next few days pending culture and sensitivity results.  She remains afebrile.  Recommend for her to continue tamoxifen 20 mg daily for her breast  cancer.   LOS: 4 days   Mikey Bussing, DNP, AGPCNP-BC, AOCNP 07/15/19    ADDENDUM: [name] has right nipple is still quite tender although less swollen.  The Gram stain showed gram-positive cocci.  ID is following and will help choose optimal coverage for her.  She will continue on tamoxifen, which does not affect her immune system in a significant way.  She also receives trastuzumab, with her next dose 07/22/2019.  I will see her on 07/22/2019 and follow-up on her breast abscess at that time  I personally saw this patient and performed a substantive portion of this encounter with the listed APP documented above.   Chauncey Cruel, MD Medical Oncology and Hematology [facility] [address] [place], [place] [address] Tel. [phone]    Fax. [phone]

## 2019-07-15 NOTE — Plan of Care (Signed)
All goals met for d/c.

## 2019-07-15 NOTE — Discharge Summary (Signed)
Physician Discharge Summary  [name] IWP:[id number] DOB: August 31, 1966 DOA: 07/11/2019  PCP: [name], NP  Admit date: 07/11/2019 Discharge date: 07/15/2019  Admitted From: Home Disposition:  Home  Recommendations for Outpatient Follow-up:  1. Follow up with PCP in 1 week 2. Follow up with Dr. [name] Oncology 10/5 3. Follow up with Dr. [name] ID on 10/6 4. Follow up at [facility] in 1 week  5. Follow up with Dr. Brantley Stage in 2 weeks 6. Please follow up on the following pending results: final culture results from abscess   Discharge Condition: Stable CODE STATUS: Full  Diet recommendation: Carb modified   Brief/Interim Summary: [name] is a71 y.o.femalewith medical history significant ofbreast cancer s/p chemo and followed by oncology, DM, HIV, HTN presents to ED with complaints of worsening R breast pain, tenderness, and swelling after she tried to pop a pimple/blackhead 2 week PTA. Area then became more inflamed with increased pain,swelling and she noted dark colored drainage She also reported subjective fevers, chills, sob. Pt already on oral daily doxycyline for tx of hidradenitis.Recently completed 5 day course of oral cipro for UTI (8/28).  She was started on empiric IV vancomycin.  General surgery, infectious disease as well as IR were consulted.  She underwent ultrasound-guided aspiration of right breast abscess with IR 9/27.  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   Non-insulin dependent type 2 diabetes mellitus (HCC)   Chronic anxiety   Hyperlipidemia   Malignant neoplasm of lower-inner quadrant of right breast of female, estrogen receptor positive (HCC)   Cellulitis of breast   Cellulitis   CKD (chronic kidney disease) stage 3, GFR 30-59 ml/min  (HCC)   Normocytic anemia   Right breast mastitis with retroareolar abscesses -US soft tissue chest shows retroareolar complex collection measuring 2.3 x 2.1 cm with an adjacent similar appearing collection measuring 1.6 x 1.1 cm -Status post ultrasound-guided aspiration of abscess 9/27 -General surgery, infectious disease following -Gram stain with gram positive cocci. Culture pending -Linezolid 600mg  BID for 2 weeks. Follow up with Dr. [name] on 10/6  -Follow up with [facility] and Dr. Brantley Stage on discharge   Right breast cancer -Status post surgery and chemotherapy -Oncology following, follow up with Dr. [name]  -Continue tamoxifen  HIV -Continue Descovy and [name]  Hypertension -Continue Norvasc, HCTZ, Avapro, Toprol, Aldactone  Diabetes mellitus, well controlled  -Hemoglobin A1c 7.1 on 8/19 -Sliding-scale insulin  Hypothyroidism -Continue Synthroid   CKD stage III -Baseline creatinine 1.4-1.7 -Stable  Hyperlipidemia -Continue Crestor  Chronic hidradenitis of right axilla -On doxycycline at home   Discharge Instructions  Discharge Instructions    Call MD for:  difficulty breathing, headache or visual disturbances   Complete by: As directed    Call MD for:  extreme fatigue   Complete by: As directed    Call MD for:  persistant dizziness or light-headedness   Complete by: As directed    Call MD for:  persistant nausea and vomiting   Complete by: As directed    Call MD for:  severe uncontrolled pain   Complete by: As directed    Call MD for:  temperature >100.4   Complete by: As directed    Diet Carb Modified   Complete by: As directed    Discharge  instructions   Complete by: As directed    You were cared for by a hospitalist during your hospital stay. If you have any questions about your discharge medications or the care you received while you were in the hospital after you are discharged, you can call the unit and ask to  speak with the hospitalist on call if the hospitalist that took care of you is not available. Once you are discharged, your primary care physician will handle any further medical issues. Please note that NO REFILLS for any discharge medications will be authorized once you are discharged, as it is imperative that you return to your primary care physician (or establish a relationship with a primary care physician if you do not have one) for your aftercare needs so that they can reassess your need for medications and monitor your lab values.   Increase activity slowly   Complete by: As directed      Allergies as of 07/15/2019      Reactions   Genvoya [elviteg-cobic-emtricit-tenofaf] Hives   Lisinopril Cough   Valsartan Other (See Comments), Cough   Pt states she tolerates medicine now      Medication List    STOP taking these medications   cephALEXin 500 MG capsule Commonly known as: KEFLEX   ciprofloxacin 500 MG tablet Commonly known as: Cipro   doxycycline 100 MG tablet Commonly known as: VIBRA-TABS   fluconazole 100 MG tablet Commonly known as: DIFLUCAN   potassium chloride 10 MEQ tablet Commonly known as: K-DUR   traMADol 50 MG tablet Commonly known as: ULTRAM     TAKE these medications   amLODipine 10 MG tablet Commonly known as: NORVASC TAKE 1 TABLET BY MOUTH EVERY DAY FOR BLOOD PRESSURE What changed: See the new instructions.   Descovy 200-25 MG tablet Generic drug: emtricitabine-tenofovir AF Take 1 tablet by mouth daily.   Farxiga 10 MG Tabs tablet Generic drug: dapagliflozin propanediol Take 10 mg by mouth daily before breakfast.   Flovent HFA 110 MCG/ACT inhaler Generic drug: fluticasone Inhale 1 puff into the lungs 2 (two) times daily.   glipiZIDE 5 MG tablet Commonly known as: GLUCOTROL Take 1 tablet (5 mg total) by mouth 2 (two) times daily before a meal. For diabetes.   irbesartan-hydrochlorothiazide 150-12.5 MG tablet Commonly known as:  AVALIDE TAKE 1 TABLET BY MOUTH ONCE DAILY FOR BLOOD PRESSURE. What changed: See the new instructions.   levothyroxine 175 MCG tablet Commonly known as: SYNTHROID TAKE 1 TABLET BY MOUTH EVERY MORNING ON AN EMPTY STOMACH WITH A FULL GLASS OF WATER. What changed: See the new instructions.   linezolid 600 MG tablet Commonly known as: Zyvox Take 1 tablet (600 mg total) by mouth 2 (two) times daily for 14 days.   metoprolol succinate 50 MG 24 hr tablet Commonly known as: TOPROL-XL TAKE 1 TABLET (50 MG TOTAL) BY MOUTH DAILY. TAKE WITH OR IMMEDIATELY FOLLOWING A MEAL.   multivitamin tablet Take 1 tablet by mouth daily.   ONE TOUCH LANCETS Misc Use as instructed to test blood sugar daily   OneTouch Verio test strip Generic drug: glucose blood USE AS INSTRUCTED TO TEST BLOOD SUGAR DAILY NEED APPOINTMENT FOR ANY MORE REFILLS What changed: See the new instructions.   rosuvastatin 10 MG tablet Commonly known as: Crestor Take 1 tablet (10 mg total) by mouth daily. For cholesterol.   spironolactone 25 MG tablet Commonly known as: ALDACTONE Take 1 tablet (25 mg total) by mouth daily.   tamoxifen 20 MG tablet  Commonly known as: NOLVADEX Take 1 tablet (20 mg total) by mouth daily.   [name] 50 MG tablet Generic drug: dolutegravir TAKE 1 TABLET BY MOUTH EVERY DAY What changed: how much to take      Follow-up Information    Cornett, Thomas, MD. Schedule an appointment as soon as possible for a visit in 2 week(s).   Specialty: General Surgery Contact information: [address] [place] [address] [phone]        Imaging, [facility]. Schedule an appointment as soon as possible for a visit in 1 week(s).   Specialty: Diagnostic Radiology Why: for follow up to see if additional aspirations are needed Contact information: [place] [address] [phone]        Michel Bickers, MD. Go on 07/23/2019.    Specialty: Infectious Diseases Contact information: [address] [place] [address] [phone]        Magrinat, Virgie Dad, MD. Go on 07/22/2019.   Specialty: Oncology Contact information: [place] [address] [phone]        [name], NP. Schedule an appointment as soon as possible for a visit in 1 week(s).   Specialty: Internal Medicine Contact information: [place] [address] [phone]        Michel Bickers, MD .   Specialty: Infectious Diseases Contact information: [place] [address] [place] [address] [phone]          Allergies  Allergen Reactions  . Genvoya [Elviteg-Cobic-Emtricit-Tenofaf] Hives  . Lisinopril Cough  . Valsartan Other (See Comments) and Cough    Pt states she tolerates medicine now    Consultations:  General surgery  Infectious disease  Interventional radiology  Oncology   Procedures/Studies: Dg Chest 2 View  Result Date: 06/28/2019 CLINICAL DATA:  Dyspnea EXAM: CHEST - 2 VIEW COMPARISON:  05/24/2019, CT chest, 09/27/2018 FINDINGS: The heart size and mediastinal contours are within normal limits. Left chest port catheter. Both lungs are clear. Unchanged, subtle sclerotic focus of the right fifth rib, in keeping with lesion as seen on prior CT. IMPRESSION: No acute abnormality of the lungs. Electronically Signed   By: Eddie Candle M.D.   On: 06/28/2019 16:01   US Guided Needle Placement  Result Date: 07/14/2019 INDICATION: Right retro-areolar breast abscess. EXAM: ULTRASOUND-GUIDED ASPIRATION DRAINAGE OF RIGHT BREAST ABSCESS MEDICATIONS: The patient is currently admitted to the hospital and receiving intravenous antibiotics. The antibiotics were administered within an appropriate time frame prior to the initiation of the procedure. ANESTHESIA/SEDATION: Fentanyl 2.0 mcg IV; Versed 100 mg IV Moderate Sedation Time:  16 minutes. The  patient was continuously monitored during the procedure by the interventional radiology nurse under my direct supervision. COMPLICATIONS: None immediate. PROCEDURE: Informed written consent was obtained from the patient after a thorough discussion of the procedural risks, benefits and alternatives. All questions were addressed. Maximal Sterile Barrier Technique was utilized including caps, mask, sterile gowns, sterile gloves, sterile drape, hand hygiene and skin antiseptic. A timeout was performed prior to the initiation of the procedure. Ultrasound was performed of the right breast. Overlying skin was prepped with chlorhexidine. Local anesthesia was provided with 1% lidocaine. Under direct ultrasound guidance, a 5 French centesis catheter was advanced over a coaxial 19 gauge needle into a complex fluid collection/abscess. Aspiration was performed. A sample of fluid was sent for culture analysis. Additional saline lavage was performed in  the cavity with a total of 10 mL of sterile saline. Injected sterile saline was completely aspirated. The centesis catheter was removed. FINDINGS: Complex retro-areolar collection measures approximately 4.6 x 1.5 x 3.7 cm by ultrasound. Aspiration yielded grossly purulent fluid. After aspiration of approximately 8 mL of purulent fluid, there was no longer any free return of fluid. Additional saline lavage was performed in the abscess cavity with sterile saline. Postprocedural ultrasound demonstrates significant improvement with decompression of the abscess and some residual soft tissue thickening and small amount of septated fluid remaining. IMPRESSION: Ultrasound-guided aspiration of right retro-areolar breast abscess yielding grossly purulent fluid. Approximately 8 mL of purulent fluid was aspirated and sent for culture analysis. Additional sterile saline lavage was performed in a complex and septated abscess cavity. Electronically Signed   By: Aletta Edouard M.D.   On:  07/14/2019 15:33   Dg Chest Port 1 View  Result Date: 07/11/2019 CLINICAL DATA:  53 year old female with a history of breast pain on the right EXAM: PORTABLE CHEST 1 VIEW COMPARISON:  June 28, 2019 FINDINGS: Cardiomediastinal silhouette unchanged in size and contour. No pneumothorax. No pleural effusion. No confluent airspace disease. Unchanged left chest wall IJ port catheter. IMPRESSION: Negative for acute cardiopulmonary disease. Left chest wall port catheter unchanged Electronically Signed   By: Corrie Mckusick D.O.   On: 07/11/2019 13:33   [place] Chest Soft Tissue  Result Date: 07/11/2019 CLINICAL DATA:  Examination is of the right breast. Patient has nipple discharge and pain. She underwent a right lumpectomy in November 2019. EXAM: ULTRASOUND OF THE RIGHT BREAST COMPARISON:  None. FINDINGS: Targeted ultrasound is performed, showing diffuse edema and hyperechoic fat consistent with inflammation. In the retroareolar region there is a complex collection measuring 2.3 x 2.1 cm with an adjacent similar appearing collection measuring 1.6 x 1.1 cm. These findings are consistent with abscesses. IMPRESSION: Findings consistent with right mastitis with right breast retroareolar abscess/abscesses. RECOMMENDATION: 1. Recommend beginning antibiotic therapy targeting the typical breast infection causes. Consider doxycycline, Augmentin or clindamycin. 2. Follow-up diagnostic evaluation in the breast Center tomorrow or the first of next week for reassessment and probable abscess aspiration. I have discussed the findings and recommendations with the patient. If applicable, a reminder letter will be sent to the patient regarding the next appointment. BI-RADS CATEGORY  2: Benign. Electronically Signed   By: Lajean Manes M.D.   On: 07/11/2019 19:48       Discharge Exam: Vitals:   07/15/19 0543 07/15/19 1334  BP: (!) 146/88 (!) 141/79  Pulse: 67 70  Resp: 18 20  Temp: 98.2 F (36.8 C) 98.3 F (36.8 C)  SpO2:  92% 96%    General exam: Appears calm and comfortable  Respiratory system: Clear to auscultation. Respiratory effort normal. No respiratory distress. No conversational dyspnea.  Cardiovascular system: S1 & S2 heard, RRR. No murmurs. No pedal edema. Gastrointestinal system: Abdomen is nondistended, soft and nontender. Normal bowel sounds heard. Central nervous system: Alert and oriented. No focal neurological deficits. Speech clear.  Extremities: Symmetric in appearance  Skin: Right breast with warmth, minimal erythema, no drainage noted on gauze Psychiatry: Judgement and insight appear normal. Mood & affect appropriate.    The results of significant diagnostics from this hospitalization (including imaging, microbiology, ancillary and laboratory) are listed below for reference.     Microbiology: Recent Results (from the past 240 hour(s))  SARS CORONAVIRUS 2 (TAT 6-24 HRS) Nasopharyngeal Nasopharyngeal Swab     Status: None   Collection Time: 07/11/19  2:00 PM   Specimen: Nasopharyngeal Swab  Result Value Ref Range Status   SARS Coronavirus 2 NEGATIVE NEGATIVE Final    Comment: (NOTE) SARS-CoV-2 target nucleic acids are NOT DETECTED. The SARS-CoV-2 RNA is generally detectable in upper and lower respiratory specimens during the acute phase of infection. Negative results do not preclude SARS-CoV-2 infection, do not rule out co-infections with other pathogens, and should not be used as the sole basis for treatment or other patient management decisions. Negative results must be combined with clinical observations, patient history, and epidemiological information. The expected result is Negative. Fact Sheet for Patients: SugarRoll.be Fact Sheet for Healthcare Providers: https://www.woods-mathews.com/ This test is not yet approved or cleared by the [place] FDA and  has been authorized for detection and/or diagnosis of SARS-CoV-2 by FDA  under an Emergency Use Authorization (EUA). This EUA will remain  in effect (meaning this test can be used) for the duration of the COVID-19 declaration under Section 56 4(b)(1) of the Act, 21 U.S.C. section 360bbb-3(b)(1), unless the authorization is terminated or revoked sooner. Performed at [facility], [place] [address] [place], [place] [address]   Body fluid culture     Status: None (Preliminary result)   Collection Time: 07/14/19  2:20 PM   Specimen: Abscess  Result Value Ref Range Status   Specimen Description   Final    ABSCESS RIGHT BREAST Performed at [facility] [address] [place], [place] [address]    Special Requests   Final    NONE Performed at [facility], [place] [address], [place], [place] [address]    Gram Stain   Final    ABUNDANT WBC PRESENT, PREDOMINANTLY PMN ABUNDANT GRAM POSITIVE COCCI    Culture   Final    NO GROWTH < 24 HOURS Performed at [facility], [place] [address], [place],  [address]    Report Status PENDING  Incomplete     Labs: BNP (last 3 results) Recent Labs    06/28/19 1141 07/11/19 1400  BNP 19.1 97.5   Basic Metabolic Panel: Recent Labs  Lab 07/11/19 1400 07/12/19 0532 07/13/19 0500 07/14/19 0312 07/15/19 0320  NA 139 140 139 141  --   K 3.6 3.6 3.4* 3.5  --   CL 103 105 105 106  --   CO2 26 26 25 25   --   GLUCOSE 238* 121* 132* 130*  --   BUN 33* 27* 23* 21*  --   CREATININE 1.79* 1.55* 1.54*  1.56* 1.48* 1.33*  CALCIUM 8.8* 8.6* 8.4* 8.6*  --    Liver Function Tests: Recent Labs  Lab 07/11/19 1400 07/12/19 0532 07/14/19 0312  AST 34 33 40  ALT 21 21 25   ALKPHOS 79 81 78  BILITOT 0.2* 0.4 0.3  PROT 7.8 7.7 7.2  ALBUMIN 3.0* 2.9* 2.7*   No results for input(s): LIPASE, AMYLASE in the last 168 hours. No results for input(s): AMMONIA in the last 168 hours. CBC: Recent Labs  Lab 07/11/19 1400 07/12/19 0532 07/14/19 0312  WBC 7.9 7.9 7.4   NEUTROABS 5.2  --  4.5  HGB 11.7* 11.4* 10.9*  HCT 36.0 35.2* 33.8*  MCV 93.5 94.1 94.7  PLT 158 161 161   Cardiac Enzymes: No results for input(s): CKTOTAL, CKMB, CKMBINDEX, TROPONINI in the last 168 hours. BNP: Invalid input(s): POCBNP CBG: Recent Labs  Lab 07/14/19 1201 07/14/19 1656 07/14/19 2010 07/15/19 0745 07/15/19 1158  GLUCAP 129* 250* 203*  129* 162*   D-Dimer No results for input(s): DDIMER in the last 72 hours. Hgb A1c No results for input(s): HGBA1C in the last 72 hours. Lipid Profile No results for input(s): CHOL, HDL, LDLCALC, TRIG, CHOLHDL, LDLDIRECT in the last 72 hours. Thyroid function studies No results for input(s): TSH, T4TOTAL, T3FREE, THYROIDAB in the last 72 hours.  Invalid input(s): FREET3 Anemia work up No results for input(s): VITAMINB12, FOLATE, FERRITIN, TIBC, IRON, RETICCTPCT in the last 72 hours. Urinalysis    Component Value Date/Time   COLORURINE YELLOW 06/14/2019 1045   APPEARANCEUR CLOUDY (A) 06/14/2019 1045   LABSPEC 1.013 06/14/2019 1045   PHURINE 6.0 06/14/2019 1045   GLUCOSEU NEGATIVE 06/14/2019 1045   GLUCOSEU NEG mg/dL 01/23/2008 1418   HGBUR LARGE (A) 06/14/2019 1045   BILIRUBINUR NEGATIVE 06/14/2019 1045   BILIRUBINUR negative 12/29/2017 1046   KETONESUR NEGATIVE 06/14/2019 1045   PROTEINUR >=300 (A) 06/14/2019 1045   UROBILINOGEN 0.2 12/29/2017 1046   UROBILINOGEN 0.2 10/21/2014 2347   NITRITE NEGATIVE 06/14/2019 1045   LEUKOCYTESUR LARGE (A) 06/14/2019 1045   Sepsis Labs Invalid input(s): PROCALCITONIN,  WBC,  LACTICIDVEN Microbiology Recent Results (from the past 240 hour(s))  SARS CORONAVIRUS 2 (TAT 6-24 HRS) Nasopharyngeal Nasopharyngeal Swab     Status: None   Collection Time: 07/11/19  2:00 PM   Specimen: Nasopharyngeal Swab  Result Value Ref Range Status   SARS Coronavirus 2 NEGATIVE NEGATIVE Final    Comment: (NOTE) SARS-CoV-2 target nucleic acids are NOT DETECTED. The SARS-CoV-2 RNA is generally  detectable in upper and lower respiratory specimens during the acute phase of infection. Negative results do not preclude SARS-CoV-2 infection, do not rule out co-infections with other pathogens, and should not be used as the sole basis for treatment or other patient management decisions. Negative results must be combined with clinical observations, patient history, and epidemiological information. The expected result is Negative. Fact Sheet for Patients: SugarRoll.be Fact Sheet for Healthcare Providers: https://www.woods-mathews.com/ This test is not yet approved or cleared by the [place] FDA and  has been authorized for detection and/or diagnosis of SARS-CoV-2 by FDA under an Emergency Use Authorization (EUA). This EUA will remain  in effect (meaning this test can be used) for the duration of the COVID-19 declaration under Section 56 4(b)(1) of the Act, 21 U.S.C. section 360bbb-3(b)(1), unless the authorization is terminated or revoked sooner. Performed at [facility],  [address], [place], [place] [address]   Body fluid culture     Status: None (Preliminary result)   Collection Time: 07/14/19  2:20 PM   Specimen: Abscess  Result Value Ref Range Status   Specimen Description   Final    ABSCESS RIGHT BREAST Performed at [facility] [address], [place], [place] [address]    Special Requests   Final    NONE Performed at [facility], [place] [address], [place], [place] [address]    Gram Stain   Final    ABUNDANT WBC PRESENT, PREDOMINANTLY PMN ABUNDANT GRAM POSITIVE COCCI    Culture   Final    NO GROWTH < 24 HOURS Performed at [facility], [place] [address], [place], [place] [address]    Report Status PENDING  Incomplete     Patient was seen and examined on the day of discharge and was found to be in stable condition. Time coordinating discharge: 35 minutes including  assessment and coordination of care, as well as examination of the patient.  SIGNED:  Dessa Phi, DO [facility] 07/15/2019, 3:30 PM

## 2019-07-15 NOTE — Progress Notes (Signed)
Pharmacy - Antimicrobial Stewardship  Benefit check for linezolid 600mg  PO BID for breast infection.  Per ID, suggest d/c on linezolid  Prescription for linezolid 600mg  tabs #28 sent to her [facility] and told her copay is $0.  Doreene Eland, PharmD, BCPS.   Work Cell: [phone] 07/15/2019 3:00 PM

## 2019-07-16 ENCOUNTER — Telehealth: Payer: Self-pay

## 2019-07-16 NOTE — Telephone Encounter (Signed)
Transition Care Management Follow-up Telephone Call   Date discharged? 07/15/2019   How have you been since you were released from the hospital? Patient just woke up. A little pain in the breast area, had hard time sleeping last night due to the pain. Taking Tylenol for the pain. Has not started antibiotic yet but will today. Has some sweating. No fever that she knows of right now.   Do you understand why you were in the hospital? yes   Do you understand the discharge instructions? yes    Where were you discharged to? Home.    Items Reviewed:  Medications reviewed: yes  Allergies reviewed: yes  Dietary changes reviewed: yes  Referrals reviewed: yes   Functional Questionnaire:   Activities of Daily Living (ADLs):   She states they are independent in the following: ambulation, dressing, bathing, hygiene, toileting, fixing food and fixing medications. States they require assistance with the following: none   Any transportation issues/concerns?: No   Any patient concerns? Not at this time   Confirmed importance and date/time of follow-up visits scheduled yes  Provider Appointment booked with Alma Friendly, NP on 07/18/2019  Confirmed with patient if condition begins to worsen call PCP or go to the ER.  Patient was given the office number and encouraged to call back with question or concerns.  : yes

## 2019-07-16 NOTE — Telephone Encounter (Signed)
Noted  

## 2019-07-17 LAB — BODY FLUID CULTURE

## 2019-07-18 ENCOUNTER — Ambulatory Visit (INDEPENDENT_AMBULATORY_CARE_PROVIDER_SITE_OTHER): Payer: 59 | Admitting: Primary Care

## 2019-07-18 ENCOUNTER — Encounter: Payer: Self-pay | Admitting: Primary Care

## 2019-07-18 ENCOUNTER — Other Ambulatory Visit: Payer: Self-pay

## 2019-07-18 VITALS — BP 124/74 | HR 79 | Temp 97.7°F | Ht 65.0 in | Wt 234.8 lb

## 2019-07-18 DIAGNOSIS — Z23 Encounter for immunization: Secondary | ICD-10-CM

## 2019-07-18 DIAGNOSIS — N611 Abscess of the breast and nipple: Secondary | ICD-10-CM | POA: Diagnosis not present

## 2019-07-18 DIAGNOSIS — Z09 Encounter for follow-up examination after completed treatment for conditions other than malignant neoplasm: Secondary | ICD-10-CM | POA: Diagnosis not present

## 2019-07-18 DIAGNOSIS — E890 Postprocedural hypothyroidism: Secondary | ICD-10-CM | POA: Diagnosis not present

## 2019-07-18 MED ORDER — HYDROCODONE-ACETAMINOPHEN 5-325 MG PO TABS: 1.0000 | ORAL_TABLET | Freq: Three times a day (TID) | ORAL | 0 refills | Status: DC | PRN

## 2019-07-18 NOTE — Progress Notes (Signed)
Subjective:    Patient ID: [name], female    DOB: 08-20-1966, 53 y.o.   MRN: [id number]  HPI  [name] is a 53 year old female with a history of breast cancer, thyroid cancer, surgical hypothyroidism, HIV, type 2 diabetes, sarcoidosis, CKD stage III, hidradenitis suppurativa of right axilla who presents today for hospital follow-up.  She was initially evaluated in our office on 07/11/2019 with a chief complaint of right breast pain.  Exam revealed moderate to severe erythema, moderate tenderness, right nipple drainage.  Given her history, current management on doxycycline, and presentation we sent her to the emergency department for further evaluation.  She presented to [facility] several hours later.  Work-up was negative for leukocytosis, chest x-ray was unremarkable.  Chest ultrasound revealing right breast retroareolar abscess. Given presentation and lack of improvement with already prescribed doxycycline for hidradenitis she was admitted for right breast cellulitis and initiated on IV vancomycin for further treatment.  During her hospital stay she was consulted by her oncologist and infectious disease.  They recommended continued IV vancomycin with incision and drainage of abscess.  General surgery consulted who recommended interventional radiology guided aspiration.  Interventional radiology recommended repeat ultrasound and monitoring first.  She underwent ultrasound-guided aspiration of the right breast abscess on 07/14/2019.  Post aspiration symptoms and presentation improved.  She was discharged home on 07/15/2019 with prescription for Linezolid 600 mg BID and recommendations for infectious disease, oncology, breast center, general surgery, PCP follow-up.  Since her discharge home she is doing much better.  She does have continued pain with tenderness around the aspiration site.  She is changing her dressings daily and is mostly seeing brownish-whitish drainage.  She is taking  Tylenol for pain which has not helped.  She was taking tramadol but this was told to stop at discharge.  She denies fevers, she is compliant to her antibiotics as prescribed.  She is aware of her appointments with infectious disease and oncology, she was not aware that she needs to follow-up with general surgery.  Review of Systems  Constitutional: Negative for fever.  Skin: Positive for wound. Negative for color change.  Neurological: Negative for dizziness.       Past Medical History:  Diagnosis Date   Allergy    Anemia    Normocytic   Anxiety    Asthma    Blood dyscrasia    Bronchitis 2005   CLASS 1-EXOPHTHALMOS-THYROTOXIC 02/08/2007   Diabetes mellitus without complication ([place])    Family history of breast cancer    Family history of lung cancer    Family history of prostate cancer    Gastroenteritis 07/10/07   GERD 07/24/2006   GRAVE'S DISEASE 01/01/2008   History of hidradenitis suppurativa    History of kidney stones    History of thrush    HIV DISEASE 07/24/2006   dx March 05   Hyperlipidemia    HYPERTENSION 07/24/2006   Hyperthyroidism 08/2006   Grave's Disease -diffuse radiotracer uptake 08/25/06 Thyroid scan-Cold nodule to R lower lobe of thyrorid   Menometrorrhagia    hx of   Nephrolithiasis    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 Completed 2 yrs Prednisone  after lung bx confirmation. No symptoms since then.   Suppurative hidradenitis    Thyroid cancer ([place])    THYROID NODULE, RIGHT 02/08/2007     Social History   Socioeconomic History   Marital status: Single    Spouse name: Not on file   Number of children: 1   Years of education: Not on  file   Highest education level: Not on file  Occupational History   Occupation: Secondary school teacher  Social Needs   Financial resource strain: Not on file   Food insecurity    Worry: Not on file    Inability: Not on file   Transportation needs    Medical: No    Non-medical: No  Tobacco Use   Smoking status: Former Smoker    Packs/day: 0.50    Years: 15.00    Pack years: 7.50    Types: Cigarettes    Start date: 04/12/2017   Smokeless tobacco: Never Used   Tobacco comment: quit smoking  may 2018  Substance and Sexual Activity   Alcohol use: Yes    Alcohol/week: 0.0 standard drinks    Comment: social   Drug use: No   Sexual activity: Not Currently    Birth control/protection: Post-menopausal    Comment: declined condoms  Lifestyle   Physical activity    Days per week: Not on file    Minutes per session: Not on file   Stress: Not on file  Relationships   Social connections    Talks on phone: Not on file    Gets together: Not on file    Attends religious service: Not on file    Active member of club or organization: Not on file    Attends meetings of clubs or organizations: Not on file    Relationship status: Not on file   Intimate partner violence    Fear of current or ex partner: No    Emotionally abused: No    Physically abused: No    Forced sexual activity: No  Other Topics Concern   Not on file  Social History Narrative   Not on file    Past Surgical History:  Procedure Laterality Date   BREAST EXCISIONAL BIOPSY Right 04/26/2018   right axilla negative   BREAST EXCISIONAL BIOPSY Left 04/26/2018   left axilla negative   BREAST LUMPECTOMY Right 10/03/2018   malignant   BREAST LUMPECTOMY WITH RADIOACTIVE SEED AND SENTINEL LYMPH NODE BIOPSY Right 10/03/2018   Procedure: RIGHT BREAST LUMPECTOMY WITH RADIOACTIVE SEED AND SENTINEL LYMPH NODE [place];  Surgeon: Erroll Luna, MD;  Location: [facility];  Service: General;  Laterality: Right;    BREAST SURGERY  1997   Breast Reduction    CYSTOSCOPY W/ URETERAL STENT REMOVAL  11/09/2012   Procedure: CYSTOSCOPY WITH STENT REMOVAL;  Surgeon: Alexis Frock, MD;  Location: [facility];  Service: Urology;  Laterality: Right;   CYSTOSCOPY WITH RETROGRADE PYELOGRAM, URETEROSCOPY AND STENT PLACEMENT  11/09/2012   Procedure: CYSTOSCOPY WITH RETROGRADE PYELOGRAM, URETEROSCOPY AND STENT PLACEMENT;  Surgeon: Alexis Frock, MD;  Location: [facility];  Service: Urology;  Laterality: Left;  LEFT URETEROSCOPY, STONE MANIPULATION, left STENT exchange    CYSTOSCOPY WITH STENT PLACEMENT  10/02/2012   Procedure: CYSTOSCOPY WITH STENT PLACEMENT;  Surgeon: Alexis Frock, MD;  Location: [facility];  Service: Urology;  Laterality: Left;   DILATION AND CURETTAGE OF UTERUS  Feb 2004   s/p for 1st trimester nonviable pregnancy   EYE SURGERY     sty under eyelid  INCISE AND DRAIN ABCESS  Nov 03   s/p I &D for righ inframmary fold hidradenitis   INCISION AND DRAINAGE PERITONSILLAR ABSCESS  Mar 03   IR CV LINE INJECTION  06/07/2018   IR IMAGING GUIDED PORT INSERTION  06/20/2018   IR REMOVAL TUN ACCESS W/ PORT W/O FL MOD SED  06/20/2018   IRRIGATION AND DEBRIDEMENT ABSCESS  01/31/2012   Procedure: IRRIGATION AND DEBRIDEMENT ABSCESS;  Surgeon: Shann Medal, MD;  Location: [facility];  Service: General;  Laterality: Right;  right breast and axilla    NEPHROLITHOTOMY  10/02/2012   Procedure: NEPHROLITHOTOMY PERCUTANEOUS;  Surgeon: Alexis Frock, MD;  Location: [facility];  Service: Urology;  Laterality: Right;  First Stage Percutaneous Nephrolithotomy with Surgeon Access, Left Ureteral Stent     NEPHROLITHOTOMY  10/04/2012   Procedure: NEPHROLITHOTOMY PERCUTANEOUS SECOND LOOK;  Surgeon: Alexis Frock, MD;  [facility];  Service: Urology;  Laterality: Right;      NEPHROLITHOTOMY  10/08/2012   Procedure: NEPHROLITHOTOMY PERCUTANEOUS;  Surgeon: Alexis Frock, MD;  [facility];  Service: Urology;  Laterality:  Right;  THIRD STAGE, nephrostomy tube exchange x 2   NEPHROLITHOTOMY  10/11/2012   Procedure: NEPHROLITHOTOMY PERCUTANEOUS SECOND LOOK;  Surgeon: Alexis Frock, MD;  [facility];  Service: Urology;  Laterality: Right;  RIGHT 4 STAGE PERCUTANOUS NEPHROLITHOTOMY, right URETEROSCOPY WITH HOLMIUM LASER    PORTACATH PLACEMENT Left 05/17/2018   Procedure: INSERTION PORT-A-CATH;  Surgeon: Coralie Keens, MD;  Location: [facility];  Service: General;  Laterality: Left;   RADICAL NECK DISSECTION  04/12/2017   limited/notes 04/12/2017   RADICAL NECK DISSECTION N/A 04/12/2017   Procedure: RADICAL NECK DISSECTION;  Surgeon: Melida Quitter, MD;  Location: [facility];  Service: ENT;  Laterality: N/A;  limited neck dissection 2 hours total   REDUCTION MAMMAPLASTY Bilateral [place]  04/12/2017   completion/notes 04/12/2017   THYROIDECTOMY N/A 04/12/2017   Procedure: THYROIDECTOMY;  Surgeon: Melida Quitter, MD;  Location: [facility];  Service: ENT;  Laterality: N/A;  Completion Thyroidectomy   TOTAL THYROIDECTOMY  2010    Family History  Problem Relation Age of Onset   Hypertension Mother    Cancer Mother        laryngeal   Heart disease Mother        stent   Hypertension Father    Lung cancer Father 72       hx smoking   Heart disease Other    Hypertension Other    Stroke Other        Grandparent   Kidney disease Other        Grandparent   Diabetes Other        FH of Diabetes   Hypertension Sister    Cancer Maternal Uncle        Lung CA   Hypertension Brother    Hypertension Sister    Breast cancer Maternal Aunt 65   Breast cancer Paternal Aunt 86   Prostate cancer Paternal Uncle    Breast cancer Maternal Aunt        dx 60+   Breast cancer Paternal Aunt        dx 55's   Breast cancer Paternal Aunt        dx 50's   Prostate cancer Paternal Uncle    Lung cancer Paternal Uncle    Breast cancer Cousin 27   Breast  cancer Cousin  dx <50   Breast cancer Cousin        dx <50   Breast cancer Cousin        dx <50   Colon cancer Neg Hx    Esophageal cancer Neg Hx    Rectal cancer Neg Hx    Stomach cancer Neg Hx     Allergies  Allergen Reactions   Genvoya [Elviteg-Cobic-Emtricit-Tenofaf] Hives   Lisinopril Cough   Valsartan Other (See Comments) and Cough    Pt states she tolerates medicine now    Current Outpatient Medications on File Prior to Visit  Medication Sig Dispense Refill   amLODipine (NORVASC) 10 MG tablet TAKE 1 TABLET BY MOUTH EVERY DAY FOR BLOOD PRESSURE (Patient taking differently: Take 10 mg by mouth daily. ) 90 tablet 1   dapagliflozin propanediol (FARXIGA) 10 MG TABS tablet Take 10 mg by mouth daily before breakfast. 30 tablet 6   emtricitabine-tenofovir AF (DESCOVY) 200-25 MG tablet Take 1 tablet by mouth daily. 30 tablet 2   fluticasone (FLOVENT HFA) 110 MCG/ACT inhaler Inhale 1 puff into the lungs 2 (two) times daily. 1 Inhaler 2   glipiZIDE (GLUCOTROL) 5 MG tablet Take 1 tablet (5 mg total) by mouth 2 (two) times daily before a meal. For diabetes. 180 tablet 0   irbesartan-hydrochlorothiazide (AVALIDE) 150-12.5 MG tablet TAKE 1 TABLET BY MOUTH ONCE DAILY FOR BLOOD PRESSURE. (Patient taking differently: Take 1 tablet by mouth daily. ) 90 tablet 1   levothyroxine (SYNTHROID) 175 MCG tablet TAKE 1 TABLET BY MOUTH EVERY MORNING ON AN EMPTY STOMACH WITH A FULL GLASS OF WATER. (Patient taking differently: Take 175 mcg by mouth daily before breakfast. TAKE  ON AN EMPTY STOMACH WITH A FULL GLASS OF WATER.) 90 tablet 0   linezolid (ZYVOX) 600 MG tablet Take 1 tablet (600 mg total) by mouth 2 (two) times daily for 14 days. 28 tablet 0   metoprolol succinate (TOPROL-XL) 50 MG 24 hr tablet TAKE 1 TABLET (50 MG TOTAL) BY MOUTH DAILY. TAKE WITH OR IMMEDIATELY FOLLOWING A MEAL. 90 tablet 0   Multiple Vitamin (MULTIVITAMIN) tablet Take 1 tablet by mouth daily.     ONE  TOUCH LANCETS MISC Use as instructed to test blood sugar daily 200 each 5   ONETOUCH VERIO test strip USE AS INSTRUCTED TO TEST BLOOD SUGAR DAILY NEED APPOINTMENT FOR ANY MORE REFILLS (Patient taking differently: 1 each by Other route as needed (Blood Sugar). ) 50 each 0   rosuvastatin (CRESTOR) 10 MG tablet Take 1 tablet (10 mg total) by mouth daily. For cholesterol. 90 tablet 0   spironolactone (ALDACTONE) 25 MG tablet Take 1 tablet (25 mg total) by mouth daily. 30 tablet 6   tamoxifen (NOLVADEX) 20 MG tablet Take 1 tablet (20 mg total) by mouth daily. 30 tablet 5   [name] 50 MG tablet TAKE 1 TABLET BY MOUTH EVERY DAY (Patient taking differently: Take 50 mg by mouth daily. ) 30 tablet 2   [DISCONTINUED] prochlorperazine (COMPAZINE) 10 MG tablet TAKE 1 TABLET BY MOUTH EVERY 6 HOURS AS NEEDED FOR NAUSEA OR VOMITING 30 tablet 2   Current Facility-Administered Medications on File Prior to Visit  Medication Dose Route Frequency Provider Last Rate Last Dose   sodium chloride flush (NS) 0.9 % injection 10 mL  10 mL Intracatheter PRN Magrinat, Virgie Dad, MD   10 mL at 09/19/18 1551    BP 124/74    Pulse 79    Temp 97.7 F (36.5 C) (Temporal)  Ht 5\' 5"  (1.651 m)    Wt 234 lb 12 oz (106.5 kg)    LMP 03/31/2014 Comment: Negative Serum HCG 06/07/17   SpO2 98%    BMI 39.06 kg/m    Objective:   Physical Exam  Constitutional: She appears well-nourished.  Respiratory: Effort normal.  Skin: Skin is warm. No erythema.  Moderate improvement to right breast compared to visit on 07/11/2019.  Whitish/light brown drainage from right nipple at site of aspiration.  Tenderness.           Assessment & Plan:

## 2019-07-18 NOTE — Assessment & Plan Note (Addendum)
Recent admission for cellulitis and abscess of right breast.  Status post aspiration of abscess.  Appears well. Right breast has significantly improved since initial visit on 07/11/2019. Continue oral antibiotics. Dressing change today. Prescription for Vicodin provided today given residual pain and tenderness.  For some reason tramadol was removed from her medication list, unclear why.  Follow-up with infectious disease and oncology as scheduled.  Hospital labs, imaging, notes reviewed.

## 2019-07-18 NOTE — Assessment & Plan Note (Addendum)
Repeat TSH on 07/11/2019 with improvement, not at goal.  She is taking levothyroxine appropriately. We will repeat her TSH in several weeks and if still above goal consider switching to brand Synthroid.  Discussed with patient to have labs drawn the week of 26 October.

## 2019-07-18 NOTE — Patient Instructions (Addendum)
You can take the hydrocodone-acetaminophen every 8 hours as needed for severe pain.  Follow up with Dr. [name] and Dr. [name].   Follow up with Dr. Donella Stade, this is mentioned in your discharge summary.   Schedule a lab only appointment to repeat your thyroid function during the week of October 26th.  It was a pleasure to see you today!

## 2019-07-18 NOTE — Addendum Note (Signed)
Addended by: Jacqualin Combes on: 07/18/2019 01:04 PM   Modules accepted: Orders

## 2019-07-19 ENCOUNTER — Other Ambulatory Visit: Payer: Self-pay | Admitting: Family Medicine

## 2019-07-19 ENCOUNTER — Encounter: Payer: Self-pay | Admitting: General Practice

## 2019-07-19 ENCOUNTER — Encounter: Payer: Self-pay | Admitting: Family Medicine

## 2019-07-19 ENCOUNTER — Telehealth: Payer: Self-pay

## 2019-07-19 DIAGNOSIS — B370 Candidal stomatitis: Secondary | ICD-10-CM

## 2019-07-19 MED ORDER — FLUCONAZOLE 50 MG PO TABS: 50.0000 mg | ORAL_TABLET | Freq: Every day | ORAL | 0 refills | Status: DC

## 2019-07-19 NOTE — Progress Notes (Signed)
See phone note dated today. °

## 2019-07-19 NOTE — Progress Notes (Signed)
[facility] CSW Progress Notes  Call from patient, states she is in urgent need of financial assistance for her rent.  Verified w Marguarite Arbour that patient has used up all her [employer], no funds available at this time.  Tried to call back, unable to leave a message. No VM.  Edwyna Shell, LCSW Clinical Social Worker Phone:  [phone]

## 2019-07-19 NOTE — Telephone Encounter (Signed)
Pt left v/m; pt said she has thrush, her tongue is irritated and red. Pt had HFU on 07/18/19. Per office note on 07/18/19 pt is continuing on oral abx due to cellulitis and abscess of rt breast.Please advise. [facility] [address] Allie Bossier NP out of office please advise. Pt request cb.

## 2019-07-19 NOTE — Telephone Encounter (Signed)
Medication sent to patient's pharmacy.  Attempted to call patient, no answer.  My chart message sent to patient.

## 2019-07-19 NOTE — Progress Notes (Signed)
[facility] CSW Progress Notes  Spoke w patient - rent is due 10/5.  Patient concerned she needs help w paying rent.  Has investigated [place] COVID relief rent assistance fund - told she does not qualify.  Left message at [facility] asking for help - asks CSW to also refer her there.  CSW will refer to Emergency Assistance unit via secure email.  Provided patient link w application for [name], letter left for oncologist to sign on Monday to accompany application.  Updated CSW Wallis Bamberg who will be covering on Monday.  Encouraged patient to complete [name] and [employer] and [employer] - these were sent to patient already and she does have ability to print these at home.  Edwyna Shell, LCSW Clinical Social Worker Phone:  [phone]

## 2019-07-21 NOTE — Progress Notes (Signed)
[place]  [phone] [phone]    ID: Masiel Gentzler DOB: 1966-01-28  MR#: [id number]  LXB#:[id number]  Patient Care Team: [name], NP as PCP - General (Internal Medicine) Michel Bickers, MD as PCP - Infectious Diseases (Infectious Diseases) Lizvet Chunn, Virgie Dad, MD as Consulting Physician (Oncology) Kyung Rudd, MD as Consulting Physician (Radiation Oncology) OTHER MD:   CHIEF COMPLAINT: Triple positive breast cancer  CURRENT TREATMENT: Adjuvant T-DM1; tamoxifen   INTERVAL HISTORY: Zeniyah returns today for follow-up and treatment of her triple positive breast cancer. She was last seen here on 07/02/2019.   She continues on adjuvant TDM1.  She says this makes her tired but otherwise she tolerates it well.Aeriana's last echocardiogram on 06/20/2019, showed an ejection fraction in the 55% - 60% range.   She also continues on tamoxifen.  She denies significant hot flashes.  She does have a little bit of discharge and currently she tells me she has a yeast infection  Since her last visit here, she was admitted with gram positive right breast abscess and cellulitis following chest xray and breast ultrasound; she was started on empiric IV vancomycin and discharged on linezolid 600 mg po BID x 2 weeks per ID (Dr [name]).  The antibiotics are giving her very mild diarrhea but some nausea.  She tried to make herself vomit but it did not help.   REVIEW OF SYSTEMS: Sulma complains of reflux.  She feels very tired.  She is overwhelmed by all that is going on.  She is taking appropriate pandemic precautions and her daughter who works outside the home is being careful she says.  There have been no unusual headaches visual changes cough phlegm production or pleurisy.  There is still a little bit of discharge she says from the right breast.  Detailed review of systems today was otherwise stable    HISTORY OF CURRENT ILLNESS: From the  original intake note:  Talli Cornelia Hochman (pronounced "Huff") palpated a mass in the right breast for about 2 weeks before bringing it to medical attention. She underwent bilateral diagnostic mammography with tomography and right breast ultrasonography at [facility] on 04/20/2018 showing: breast density category B. There was a highly suspicious mass located at 3 o'clock in the upper inner quadrant measuring 2.9 x 2.5 x 1.6 cm and 4 cm from the nipple. Ultrasound of the right axilla showed 5 lymph nodes with abnormal cortical thickening. The left axilla showed 3 lymph nodes with abnormal cortical thickening.  Accordingly on 04/26/2018 she proceeded to biopsy of the right breast area in question. The pathology from this procedure showed ([id number]): Invasive ductal carcinoma, grade II with calcifications. One right axillary lymph node and one left axillary lymph node were negative for carcinoma. Prognostic indicators significant for: estrogen receptor, 100% positive and progesterone receptor, 80% positive, both with strong staining intensity. Proliferation marker Ki67 at 70%. HER2 amplified with ratios HER2/CEP17 signals 4.52 and average HER2 copies per cell 10.85  The patient's subsequent history is as detailed below.  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   Diabetes mellitus without complication ([place])    Family history of breast cancer    Family history of lung cancer    Family history of prostate cancer    Gastroenteritis 07/10/07  GERD 07/24/2006   GRAVE'S DISEASE 01/01/2008   History of hidradenitis suppurativa    History of kidney stones    History of thrush    HIV DISEASE 07/24/2006   dx March 05   Hyperlipidemia    HYPERTENSION 07/24/2006   Hyperthyroidism 08/2006   Grave's Disease -diffuse  radiotracer uptake 08/25/06 Thyroid scan-Cold nodule to R lower lobe of thyrorid   Menometrorrhagia    hx of   Nephrolithiasis    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 Completed 2 yrs Prednisone after lung bx confirmation. No symptoms since then.   Suppurative hidradenitis    Thyroid cancer ([place])    THYROID NODULE, RIGHT 02/08/2007  -2018 thyroid nodules were precancerous but pathology was negative for thyroid cancer.   PAST SURGICAL HISTORY: Past Surgical History:  Procedure Laterality Date   BREAST EXCISIONAL BIOPSY Right 04/26/2018   right axilla negative   BREAST EXCISIONAL BIOPSY Left 04/26/2018   left axilla negative   BREAST LUMPECTOMY Right 10/03/2018   malignant   BREAST LUMPECTOMY WITH RADIOACTIVE SEED AND SENTINEL LYMPH NODE BIOPSY Right 10/03/2018   Procedure: RIGHT BREAST LUMPECTOMY WITH RADIOACTIVE SEED AND SENTINEL LYMPH NODE MAPPING;  Surgeon: Erroll Luna, MD;  Location: [facility];  Service: General;  Laterality: Right;   BREAST SURGERY  1997   Breast Reduction    CYSTOSCOPY W/ URETERAL STENT REMOVAL  11/09/2012   Procedure: CYSTOSCOPY WITH STENT REMOVAL;  Surgeon: Alexis Frock, MD;  Location: [facility];  Service: Urology;  Laterality: Right;   CYSTOSCOPY WITH RETROGRADE PYELOGRAM, URETEROSCOPY AND STENT PLACEMENT  11/09/2012   Procedure: CYSTOSCOPY WITH RETROGRADE PYELOGRAM, URETEROSCOPY AND STENT PLACEMENT;  Surgeon: Alexis Frock, MD;  Location: [facility];  Service: Urology;  Laterality: Left;  LEFT URETEROSCOPY, STONE MANIPULATION, left STENT exchange    CYSTOSCOPY WITH STENT PLACEMENT  10/02/2012   Procedure: CYSTOSCOPY WITH STENT PLACEMENT;  Surgeon: Alexis Frock, MD;   Location: [facility];  Service: Urology;  Laterality: Left;   DILATION AND CURETTAGE OF UTERUS  Feb 2004   s/p for 1st trimester nonviable pregnancy   EYE SURGERY     sty under eyelid   INCISE AND DRAIN ABCESS  Nov 03   s/p I &D for righ inframmary fold hidradenitis   INCISION AND DRAINAGE PERITONSILLAR ABSCESS  Mar 03   IR CV LINE INJECTION  06/07/2018   IR IMAGING GUIDED PORT INSERTION  06/20/2018   IR REMOVAL TUN ACCESS W/ PORT W/O FL MOD SED  06/20/2018   IRRIGATION AND DEBRIDEMENT ABSCESS  01/31/2012   Procedure: IRRIGATION AND DEBRIDEMENT ABSCESS;  Surgeon: Shann Medal, MD;  Location[facility];  Service: General;  Laterality: Right;  right breast and axilla    NEPHROLITHOTOMY  10/02/2012   Procedure: NEPHROLITHOTOMY PERCUTANEOUS;  Surgeon: Alexis Frock, MD;  Location[facility];  Service: Urology;  Laterality: Right;  First Stage Percutaneous Nephrolithotomy with Surgeon Access, Left Ureteral Stent     NEPHROLITHOTOMY  10/04/2012   Procedure: NEPHROLITHOTOMY PERCUTANEOUS SECOND LOOK;  Surgeon: Alexis Frock, MD;  [facility];  Service: Urology;  Laterality: Right;      NEPHROLITHOTOMY  10/08/2012   Procedure: NEPHROLITHOTOMY PERCUTANEOUS;  Surgeon: Alexis Frock, MD;  Location:  [facility];  Service: Urology;  Laterality: Right;  THIRD STAGE, nephrostomy tube exchange x 2   NEPHROLITHOTOMY  10/11/2012   Procedure: NEPHROLITHOTOMY PERCUTANEOUS SECOND LOOK;  Surgeon: Alexis Frock, MD;  Location[facility];  Service: Urology;  Laterality: Right;  RIGHT 4 STAGE PERCUTANOUS NEPHROLITHOTOMY, right URETEROSCOPY WITH HOLMIUM LASER    PORTACATH PLACEMENT Left 05/17/2018   Procedure: INSERTION PORT-A-CATH;  Surgeon: Coralie Keens, MD;  Location: [facility];  Service: General;  Laterality: Left;   RADICAL NECK DISSECTION  04/12/2017   limited/notes 04/12/2017   RADICAL NECK DISSECTION N/A 04/12/2017   Procedure: RADICAL NECK DISSECTION;  Surgeon: Melida Quitter, MD;   Location: [facility];  Service: ENT;  Laterality: N/A;  limited neck dissection 2 hours total   REDUCTION MAMMAPLASTY Bilateral [place]  04/12/2017   completion/notes 04/12/2017   THYROIDECTOMY N/A 04/12/2017   Procedure: THYROIDECTOMY;  Surgeon: Melida Quitter, MD;  Location: [facility];  Service: ENT;  Laterality: N/A;  Completion Thyroidectomy   TOTAL THYROIDECTOMY  2010  Thyroid nodules removed- pathology at the ENT doctor 2018 were benign -Over active thyroid and thyroidectomy with benign pathology  FAMILY HISTORY Family History  Problem Relation Age of Onset   Hypertension Mother    Cancer Mother        laryngeal   Heart disease Mother        stent   Hypertension Father    Lung cancer Father 9       hx smoking   Heart disease Other    Hypertension Other    Stroke Other        Grandparent   Kidney disease Other        Grandparent   Diabetes Other        FH of Diabetes   Hypertension Sister    Cancer Maternal Uncle        Lung CA   Hypertension Brother    Hypertension Sister    Breast cancer Maternal Aunt 65   Breast cancer Paternal Aunt 47   Prostate cancer Paternal Uncle    Breast cancer Maternal Aunt        dx 60+   Breast cancer Paternal Aunt        dx 69's   Breast cancer Paternal Aunt        dx 50's   Prostate cancer Paternal Uncle    Lung cancer Paternal Uncle    Breast cancer Cousin 74   Breast cancer Cousin        dx <50   Breast cancer Cousin        dx <50   Breast cancer Cousin        dx <50   Colon cancer Neg Hx    Esophageal cancer Neg Hx    Rectal cancer Neg Hx    Stomach cancer Neg Hx   The patient's mother is alive at age 39. The patient's father died at 27 from lung cancer (heavy smoker). She does not know much about her father. The patient has 1 brother and 2 sisters. The patient's mother had a history of laryngeal cancer. There was a maternal cousin with breast cancer diagnosed at age  3. There were 2 maternal aunts with breast cancer, the youngest being diagnosed in the 77's. There was a maternal great aunt with breast cancer. There were 2 paternal aunts with breast cancer.    GYNECOLOGIC HISTORY:  Patient's last menstrual period was 03/31/2014.  Menarche: 53 years old Age at first live birth: 53 years old She is GX P1.  Her LMP was in 2017. She did not use HRT.    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 At home is the patient's daughter, Verdie Drown, age 17, who is a Engineer, technical sales education and working in Therapist, art.  The patient attends [employer].   ADVANCED DIRECTIVES: Not in place; at the 05/10/2018 visit the patient was given the appropriate documents to complete and notarized at her discretion   HEALTH MAINTENANCE: Social History   Tobacco Use   Smoking status: Former Smoker    Packs/day: 0.50    Years: 15.00    Pack years: 7.50    Types: Cigarettes    Start date: 04/12/2017   Smokeless tobacco: Never Used   Tobacco comment: quit smoking  may 2018  Substance Use Topics   Alcohol use: Yes    Alcohol/week: 0.0 standard drinks    Comment: social   Drug use: No     Colonoscopy: no  PAP: [facility]. 2018  Bone density: remote   Allergies  Allergen Reactions   Genvoya [Elviteg-Cobic-Emtricit-Tenofaf] Hives   Lisinopril Cough   Valsartan Other (See Comments) and Cough    Pt states she tolerates medicine now    Current Outpatient Medications  Medication Sig Dispense Refill   amLODipine (NORVASC) 10 MG tablet TAKE 1 TABLET BY MOUTH EVERY DAY FOR BLOOD PRESSURE (Patient taking differently: Take 10 mg by mouth daily. ) 90 tablet 1   dapagliflozin propanediol (FARXIGA) 10 MG TABS tablet Take 10 mg by mouth daily before breakfast. 30 tablet 6   emtricitabine-tenofovir AF (DESCOVY) 200-25 MG tablet Take 1 tablet by mouth daily. 30 tablet 2   fluconazole (DIFLUCAN) 50 MG  tablet Take 1 tablet (50 mg total) by mouth daily. 7 tablet 0   fluticasone (FLOVENT HFA) 110 MCG/ACT inhaler Inhale 1 puff into the lungs 2 (two) times daily. 1 Inhaler 2   glipiZIDE (GLUCOTROL) 5 MG tablet Take 1 tablet (5 mg total) by mouth 2 (two) times daily before a meal. For diabetes. 180 tablet 0   HYDROcodone-acetaminophen (NORCO/VICODIN) 5-325 MG tablet Take 1 tablet by mouth every 8 (eight) hours as needed for moderate pain. 15 tablet 0   irbesartan-hydrochlorothiazide (AVALIDE) 150-12.5 MG tablet TAKE 1 TABLET BY MOUTH ONCE DAILY FOR BLOOD PRESSURE. (Patient taking differently: Take 1 tablet by mouth daily. ) 90 tablet 1   levothyroxine (SYNTHROID) 175 MCG tablet TAKE 1 TABLET BY MOUTH EVERY MORNING ON AN EMPTY STOMACH WITH A FULL GLASS OF WATER. (Patient taking differently: Take 175 mcg by mouth daily before breakfast. TAKE  ON AN EMPTY STOMACH WITH A FULL GLASS OF WATER.) 90 tablet 0   linezolid (ZYVOX) 600 MG tablet Take 1 tablet (600 mg total) by mouth 2 (two) times daily for 14 days. 28 tablet 0   metoprolol succinate (TOPROL-XL) 50 MG 24 hr tablet TAKE 1 TABLET (50 MG TOTAL) BY MOUTH DAILY. TAKE WITH OR IMMEDIATELY FOLLOWING A MEAL. 90 tablet 0   Multiple Vitamin (MULTIVITAMIN) tablet Take 1 tablet by mouth daily.     ONE TOUCH LANCETS MISC Use as instructed to test blood sugar daily 200 each 5   ONETOUCH VERIO test strip USE AS INSTRUCTED TO TEST BLOOD SUGAR DAILY NEED APPOINTMENT FOR ANY MORE REFILLS (Patient taking differently: 1 each by Other route as needed (Blood Sugar). ) 50 each 0  rosuvastatin (CRESTOR) 10 MG tablet Take 1 tablet (10 mg total) by mouth daily. For cholesterol. 90 tablet 0   spironolactone (ALDACTONE) 25 MG tablet Take 1 tablet (25 mg total) by mouth daily. 30 tablet 6   tamoxifen (NOLVADEX) 20 MG tablet Take 1 tablet (20 mg total) by mouth daily. 30 tablet 5   [name] 50 MG tablet TAKE 1 TABLET BY MOUTH EVERY DAY (Patient taking differently:  Take 50 mg by mouth daily. ) 30 tablet 2   No current facility-administered medications for this visit.    Facility-Administered Medications Ordered in Other Visits  Medication Dose Route Frequency Provider Last Rate Last Dose   sodium chloride flush (NS) 0.9 % injection 10 mL  10 mL Intracatheter PRN Migel Hannis, Virgie Dad, MD   10 mL at 09/19/18 1551    OBJECTIVE: Middle-aged African-American woman who appears stated age  There were no vitals filed for this visit. Wt Readings from Last 3 Encounters:  07/18/19 234 lb 12 oz (106.5 kg)  07/12/19 234 lb (106.1 kg)  07/11/19 235 lb 8 oz (106.8 kg)   There is no height or weight on file to calculate BMI.    ECOG FS:1 - Symptomatic but completely ambulatory  Ocular: Sclerae unicteric, pupils round and equal Ear-nose-throat: Wearing a mask Lymphatic: No cervical or supraclavicular adenopathy Lungs no rales or rhonchi Heart regular rate and rhythm Abd soft, nontender, positive bowel sounds MSK no focal spinal tenderness, no joint edema Neuro: non-focal, well-oriented, appropriate affect Breasts: The right breast is status post lumpectomy and radiation.  The area of concern near the nipple looks firm, not suppurating or leaking.  Left breast is benign.  Both axillae are benign  LAB RESULTS:  CMP     Component Value Date/Time   NA 141 07/14/2019 0312   K 3.5 07/14/2019 0312   CL 106 07/14/2019 0312   CO2 25 07/14/2019 0312   GLUCOSE 130 (H) 07/14/2019 0312   BUN 21 (H) 07/14/2019 0312   CREATININE 1.33 (H) 07/15/2019 0320   CREATININE 1.28 (H) 05/10/2018 1458   CREATININE 1.02 09/23/2015 1047   CALCIUM 8.6 (L) 07/14/2019 0312   PROT 7.2 07/14/2019 0312   ALBUMIN 2.7 (L) 07/14/2019 0312   AST 40 07/14/2019 0312   AST 28 05/10/2018 1458   ALT 25 07/14/2019 0312   ALT 23 05/10/2018 1458   ALKPHOS 78 07/14/2019 0312   BILITOT 0.3 07/14/2019 0312   BILITOT <0.2 (L) 05/10/2018 1458   GFRNONAA 46 (L) 07/15/2019 0320   GFRNONAA 48  (L) 05/10/2018 1458   GFRAA 54 (L) 07/15/2019 0320   GFRAA 56 (L) 05/10/2018 1458    No results found for: TOTALPROTELP, ALBUMINELP, A1GS, A2GS, BETS, BETA2SER, GAMS, MSPIKE, SPEI  No results found for: KPAFRELGTCHN, LAMBDASER, KAPLAMBRATIO  Lab Results  Component Value Date   WBC 8.9 07/22/2019   NEUTROABS 6.6 07/22/2019   HGB 11.8 (L) 07/22/2019   HCT 35.3 (L) 07/22/2019   MCV 91.7 07/22/2019   PLT 176 07/22/2019    _0 @  No results found for: LABCA2  No components found for: [id number]  No results for input(s): INR in the last 168 hours.  No results found for: LABCA2  No results found for: OZY248  No results found for: GNO037  No results found for: CWU889  No results found for: CA2729  No components found for: HGQUANT  No results found for: CEA1 / No results found for: CEA1   No results found for: AFPTUMOR  No  results found for: [place]  No results found for: PSA1  Appointment on 07/22/2019  Component Date Value Ref Range Status   WBC 07/22/2019 8.9  4.0 - 10.5 K/uL Final   RBC 07/22/2019 3.85* 3.87 - 5.11 MIL/uL Final   Hemoglobin 07/22/2019 11.8* 12.0 - 15.0 g/dL Final   HCT 07/22/2019 35.3* 36.0 - 46.0 % Final   MCV 07/22/2019 91.7  80.0 - 100.0 fL Final   MCH 07/22/2019 30.6  26.0 - 34.0 pg Final   MCHC 07/22/2019 33.4  30.0 - 36.0 g/dL Final   RDW 07/22/2019 16.2* 11.5 - 15.5 % Final   Platelets 07/22/2019 176  150 - 400 K/uL Final   nRBC 07/22/2019 0.0  0.0 - 0.2 % Final   Neutrophils Relative % 07/22/2019 74  % Final   Neutro Abs 07/22/2019 6.6  1.7 - 7.7 K/uL Final   Lymphocytes Relative 07/22/2019 16  % Final   Lymphs Abs 07/22/2019 1.4  0.7 - 4.0 K/uL Final   Monocytes Relative 07/22/2019 7  % Final   Monocytes Absolute 07/22/2019 0.6  0.1 - 1.0 K/uL Final   Eosinophils Relative 07/22/2019 2  % Final   Eosinophils Absolute 07/22/2019 0.2  0.0 - 0.5 K/uL Final   Basophils Relative 07/22/2019 0  % Final    Basophils Absolute 07/22/2019 0.0  0.0 - 0.1 K/uL Final   Immature Granulocytes 07/22/2019 1  % Final   Abs Immature Granulocytes 07/22/2019 0.05  0.00 - 0.07 K/uL Final   Performed at [facility], [address] [place], [place] [address]    (this displays the last labs from the last 3 days)  No results found for: TOTALPROTELP, ALBUMINELP, A1GS, A2GS, BETS, BETA2SER, GAMS, MSPIKE, SPEI (this displays SPEP labs)  No results found for: KPAFRELGTCHN, LAMBDASER, KAPLAMBRATIO (kappa/lambda light chains)  No results found for: HGBA, HGBA2QUANT, HGBFQUANT, HGBSQUAN (Hemoglobinopathy evaluation)   No results found for: LDH  Lab Results  Component Value Date   IRON 26 (L) 02/08/2007   TIBC 371 02/08/2007   IRONPCTSAT 7 (L) 02/08/2007   (Iron and TIBC)  No results found for: FERRITIN  Urinalysis    Component Value Date/Time   COLORURINE YELLOW 06/14/2019 1045   APPEARANCEUR CLOUDY (A) 06/14/2019 1045   LABSPEC 1.013 06/14/2019 1045   PHURINE 6.0 06/14/2019 1045   GLUCOSEU NEGATIVE 06/14/2019 1045   GLUCOSEU NEG mg/dL 01/23/2008 1418   HGBUR LARGE (A) 06/14/2019 [place] 06/14/2019 1045   BILIRUBINUR negative 12/29/2017 [place] 06/14/2019 1045   PROTEINUR >=300 (A) 06/14/2019 1045   UROBILINOGEN 0.2 12/29/2017 1046   UROBILINOGEN 0.2 10/21/2014 2347   NITRITE NEGATIVE 06/14/2019 1045   LEUKOCYTESUR LARGE (A) 06/14/2019 1045     STUDIES: Dg Chest 2 View  Result Date: 06/28/2019 CLINICAL DATA:  Dyspnea EXAM: CHEST - 2 VIEW COMPARISON:  05/24/2019, CT chest, 09/27/2018 FINDINGS: The heart size and mediastinal contours are within normal limits. Left chest port catheter. Both lungs are clear. Unchanged, subtle sclerotic focus of the right fifth rib, in keeping with lesion as seen on prior CT. IMPRESSION: No acute abnormality of the lungs. Electronically Signed   By: Eddie Candle M.D.   On: 06/28/2019 16:01   US  Guided Needle Placement  Result Date: 07/14/2019 INDICATION: Right retro-areolar breast abscess. EXAM: ULTRASOUND-GUIDED ASPIRATION DRAINAGE OF RIGHT BREAST ABSCESS MEDICATIONS: The patient is currently admitted to the hospital and receiving intravenous antibiotics. The antibiotics were administered within an appropriate  time frame prior to the initiation of the procedure. ANESTHESIA/SEDATION: Fentanyl 2.0 mcg IV; Versed 100 mg IV Moderate Sedation Time:  16 minutes. The patient was continuously monitored during the procedure by the interventional radiology nurse under my direct supervision. COMPLICATIONS: None immediate. PROCEDURE: Informed written consent was obtained from the patient after a thorough discussion of the procedural risks, benefits and alternatives. All questions were addressed. Maximal Sterile Barrier Technique was utilized including caps, mask, sterile gowns, sterile gloves, sterile drape, hand hygiene and skin antiseptic. A timeout was performed prior to the initiation of the procedure. Ultrasound was performed of the right breast. Overlying skin was prepped with chlorhexidine. Local anesthesia was provided with 1% lidocaine. Under direct ultrasound guidance, a 5 French centesis catheter was advanced over a coaxial 19 gauge needle into a complex fluid collection/abscess. Aspiration was performed. A sample of fluid was sent for culture analysis. Additional saline lavage was performed in the cavity with a total of 10 mL of sterile saline. Injected sterile saline was completely aspirated. The centesis catheter was removed. FINDINGS: Complex retro-areolar collection measures approximately 4.6 x 1.5 x 3.7 cm by ultrasound. Aspiration yielded grossly purulent fluid. After aspiration of approximately 8 mL of purulent fluid, there was no longer any free return of fluid. Additional saline lavage was performed in the abscess cavity with sterile saline. Postprocedural ultrasound demonstrates significant  improvement with decompression of the abscess and some residual soft tissue thickening and small amount of septated fluid remaining. IMPRESSION: Ultrasound-guided aspiration of right retro-areolar breast abscess yielding grossly purulent fluid. Approximately 8 mL of purulent fluid was aspirated and sent for culture analysis. Additional sterile saline lavage was performed in a complex and septated abscess cavity. Electronically Signed   By: Aletta Edouard M.D.   On: 07/14/2019 15:33   Dg Chest Port 1 View  Result Date: 07/11/2019 CLINICAL DATA:  53 year old female with a history of breast pain on the right EXAM: PORTABLE CHEST 1 VIEW COMPARISON:  June 28, 2019 FINDINGS: Cardiomediastinal silhouette unchanged in size and contour. No pneumothorax. No pleural effusion. No confluent airspace disease. Unchanged left chest wall IJ port catheter. IMPRESSION: Negative for acute cardiopulmonary disease. Left chest wall port catheter unchanged Electronically Signed   By: Corrie Mckusick D.O.   On: 07/11/2019 13:33   [place] Chest Soft Tissue  Result Date: 07/11/2019 CLINICAL DATA:  Examination is of the right breast. Patient has nipple discharge and pain. She underwent a right lumpectomy in November 2019. EXAM: ULTRASOUND OF THE RIGHT BREAST COMPARISON:  None. FINDINGS: Targeted ultrasound is performed, showing diffuse edema and hyperechoic fat consistent with inflammation. In the retroareolar region there is a complex collection measuring 2.3 x 2.1 cm with an adjacent similar appearing collection measuring 1.6 x 1.1 cm. These findings are consistent with abscesses. IMPRESSION: Findings consistent with right mastitis with right breast retroareolar abscess/abscesses. RECOMMENDATION: 1. Recommend beginning antibiotic therapy targeting the typical breast infection causes. Consider doxycycline, Augmentin or clindamycin. 2. Follow-up diagnostic evaluation in the breast Center tomorrow or the first of next week for  reassessment and probable abscess aspiration. I have discussed the findings and recommendations with the patient. If applicable, a reminder letter will be sent to the patient regarding the next appointment. BI-RADS CATEGORY  2: Benign. Electronically Signed   By: Lajean Manes M.D.   On: 07/11/2019 19:48     ELIGIBLE FOR AVAILABLE RESEARCH PROTOCOL: UPBEAT   ASSESSMENT: 53 y.o. [place], [place] woman status post right breast upper inner quadrant biopsy 04/26/2018 for  a clinical T2 N0, stage IB invasive ductal carcinoma, grade 2, ER/PR positive, HER-2 positive with an MIB-1 of 70%.  (1) genetics testing 07/03/2018: no pathogenic mutations. Genes Analyzed (43 total): APC*, ATM*, AXIN2, BARD1, BMPR1A, BRCA1*, BRCA2*, BRIP1*, CDH1*, CDK4, CDKN2A, CHEK2*, DICER1,GALNT12, HOXB13, MEN1, MLH1*, MRE11A, MSH2*, MSH3, MSH6*, MUTYH*, NBN, NF1*, NTHL1, PALB2*, PMS2*, POLD1, POLE,PTEN*, RAD50, RAD51C*, RAD51D*, RET, SDHB, SDHD, SMAD4, SMARCA4, STK11 and TP53* (sequencing and deletion/duplication); EGFR (sequencing only); EPCAM and GREM1 (deletion/duplication only). DNA and RNA analyses performed for * genes.  (2) neoadjuvant chemotherapy consisting of carboplatin, docetaxel, trastuzumab and pertuzumab given every 21 days x 6, starting 05/31/2018, completed 09/14/2018  (a) Pertuzumab discontinued starting with cycle 2 because of diarrhea problems  (b) Taxotere stopped after 4 cycles due peripheral neuropathy and substituted for last two cycles with gemcitabine  (3)  definitive surgery on 10/03/2018 showed residual ypT1c ypN0 invasive ductal carcinoma, grade 2, with negative margins; a total of 5 sentinel lymph nodes removed; repeat prognostic panel again triple positive  (4) T-DM1 given adjuvantly due to residual disease: started 11/13/2018.  (a) echo on 05/16/2018 shows an EF of 60-65%.  (b) echo on 08/27/18 shows an EF 55-60%  (c) echo on 12/03/2018 shows an EF of 60-65%  (d) echocardiogram on 03/06/2019 showed  an ejection fraction of 55% as read by Dr. Haroldine Laws.  (e) echocardiogram 06/20/2019 shows an ejection fraction in the 55-60% range  (5) adjuvant radiation completed 01/07/2019 - 02/21/2019 (delayed due to wound healing issues)  Site/dose:   The patient initially received a dose of 50.4 Gy in 28 fractions to the right breast using whole-breast tangent fields. This was delivered using a 3-D conformal technique. The patient then received a boost to the seroma. This delivered an additional 10 Gy in 5 fractions using 12e electrons with a special teletherapy technique. The total dose was 60.4 Gy.  (6) Tamoxifen started 02/28/2019  (7) hidradenitis/boils: on doxycycline daily   PLAN: Jasmina continues to tolerate the TDM 1 generally well.  I would like to give her a total of 1 year if we could.  That means 5 more doses.  She will need a repeat echocardiogram sometime in December.  Currently she is having issues from the antibiotics.  She is taking them faithfully however.  They seem to be working.  She is getting a yeast infection because of it however.  I wrote her for ondansetron and Diflucan and also ondansetron for her reflux.  I hope this will make it a little bit easier for her.  When she returns to see [place] in 3 weeks she will be off her antibiotics and hopefully things will be closer to baseline for her.  She knows to call for any other problems that may develop before the next visit.   Anisah Kuck, Virgie Dad, MD  07/22/19 10:40 AM Medical Oncology and Hematology [facility] [place], [place] [address] Tel. [phone]    Fax. [phone]  I, Jacqualyn Posey am acting as a Education administrator for Chauncey Cruel, MD.   I, Lurline Del MD, have reviewed the above documentation for accuracy and completeness, and I agree with the above.

## 2019-07-22 ENCOUNTER — Inpatient Hospital Stay: Payer: 59

## 2019-07-22 ENCOUNTER — Inpatient Hospital Stay: Payer: 59 | Attending: Oncology | Admitting: Oncology

## 2019-07-22 ENCOUNTER — Telehealth: Payer: Self-pay | Admitting: *Deleted

## 2019-07-22 ENCOUNTER — Encounter: Payer: Self-pay | Admitting: *Deleted

## 2019-07-22 ENCOUNTER — Other Ambulatory Visit: Payer: Self-pay

## 2019-07-22 VITALS — BP 125/75 | HR 75 | Temp 98.5°F | Resp 18 | Ht 65.0 in | Wt 237.1 lb

## 2019-07-22 DIAGNOSIS — C50311 Malignant neoplasm of lower-inner quadrant of right female breast: Secondary | ICD-10-CM

## 2019-07-22 DIAGNOSIS — Z79810 Long term (current) use of selective estrogen receptor modulators (SERMs): Secondary | ICD-10-CM | POA: Diagnosis not present

## 2019-07-22 DIAGNOSIS — N1832 Chronic kidney disease, stage 3b: Secondary | ICD-10-CM

## 2019-07-22 DIAGNOSIS — C73 Malignant neoplasm of thyroid gland: Secondary | ICD-10-CM

## 2019-07-22 DIAGNOSIS — R197 Diarrhea, unspecified: Secondary | ICD-10-CM | POA: Diagnosis not present

## 2019-07-22 DIAGNOSIS — D869 Sarcoidosis, unspecified: Secondary | ICD-10-CM

## 2019-07-22 DIAGNOSIS — L732 Hidradenitis suppurativa: Secondary | ICD-10-CM

## 2019-07-22 DIAGNOSIS — Z170 Estrogen receptor positive status [ER+]: Secondary | ICD-10-CM

## 2019-07-22 DIAGNOSIS — Z5112 Encounter for antineoplastic immunotherapy: Secondary | ICD-10-CM | POA: Diagnosis not present

## 2019-07-22 DIAGNOSIS — C50211 Malignant neoplasm of upper-inner quadrant of right female breast: Secondary | ICD-10-CM

## 2019-07-22 DIAGNOSIS — K219 Gastro-esophageal reflux disease without esophagitis: Secondary | ICD-10-CM | POA: Insufficient documentation

## 2019-07-22 DIAGNOSIS — Z79899 Other long term (current) drug therapy: Secondary | ICD-10-CM | POA: Insufficient documentation

## 2019-07-22 DIAGNOSIS — B379 Candidiasis, unspecified: Secondary | ICD-10-CM | POA: Diagnosis not present

## 2019-07-22 DIAGNOSIS — D649 Anemia, unspecified: Secondary | ICD-10-CM

## 2019-07-22 LAB — CBC WITH DIFFERENTIAL/PLATELET
Abs Immature Granulocytes: 0.05 10*3/uL (ref 0.00–0.07)
Basophils Absolute: 0 10*3/uL (ref 0.0–0.1)
Basophils Relative: 0 %
Eosinophils Absolute: 0.2 10*3/uL (ref 0.0–0.5)
Eosinophils Relative: 2 %
HCT: 35.3 % — ABNORMAL LOW (ref 36.0–46.0)
Hemoglobin: 11.8 g/dL — ABNORMAL LOW (ref 12.0–15.0)
Immature Granulocytes: 1 %
Lymphocytes Relative: 16 %
Lymphs Abs: 1.4 10*3/uL (ref 0.7–4.0)
MCH: 30.6 pg (ref 26.0–34.0)
MCHC: 33.4 g/dL (ref 30.0–36.0)
MCV: 91.7 fL (ref 80.0–100.0)
Monocytes Absolute: 0.6 10*3/uL (ref 0.1–1.0)
Monocytes Relative: 7 %
Neutro Abs: 6.6 10*3/uL (ref 1.7–7.7)
Neutrophils Relative %: 74 %
Platelets: 176 10*3/uL (ref 150–400)
RBC: 3.85 MIL/uL — ABNORMAL LOW (ref 3.87–5.11)
RDW: 16.2 % — ABNORMAL HIGH (ref 11.5–15.5)
WBC: 8.9 10*3/uL (ref 4.0–10.5)
nRBC: 0 % (ref 0.0–0.2)

## 2019-07-22 LAB — COMPREHENSIVE METABOLIC PANEL
ALT: 20 U/L (ref 0–44)
AST: 29 U/L (ref 15–41)
Albumin: 2.9 g/dL — ABNORMAL LOW (ref 3.5–5.0)
Alkaline Phosphatase: 93 U/L (ref 38–126)
Anion gap: 11 (ref 5–15)
BUN: 30 mg/dL — ABNORMAL HIGH (ref 6–20)
CO2: 26 mmol/L (ref 22–32)
Calcium: 9.8 mg/dL (ref 8.9–10.3)
Chloride: 102 mmol/L (ref 98–111)
Creatinine, Ser: 2.17 mg/dL — ABNORMAL HIGH (ref 0.44–1.00)
GFR calc Af Amer: 30 mL/min — ABNORMAL LOW (ref >60)
GFR calc non Af Amer: 26 mL/min — ABNORMAL LOW (ref >60)
Glucose, Bld: 213 mg/dL — ABNORMAL HIGH (ref 70–99)
Potassium: 4.1 mmol/L (ref 3.5–5.1)
Sodium: 139 mmol/L (ref 135–145)
Total Bilirubin: <0.2 mg/dL — ABNORMAL LOW (ref 0.3–1.2)
Total Protein: 8.2 g/dL — ABNORMAL HIGH (ref 6.5–8.1)

## 2019-07-22 MED ORDER — SODIUM CHLORIDE 0.9% FLUSH
10.0000 mL | INTRAVENOUS | Status: DC | PRN
  Administered 2019-07-22: 10 mL
  Filled 2019-07-22: qty 10

## 2019-07-22 MED ORDER — FLUCONAZOLE 100 MG PO TABS: 100.0000 mg | ORAL_TABLET | Freq: Every day | ORAL | 0 refills | Status: DC

## 2019-07-22 MED ORDER — ONDANSETRON HCL 8 MG PO TABS: 8.0000 mg | ORAL_TABLET | Freq: Three times a day (TID) | ORAL | 2 refills | Status: DC | PRN

## 2019-07-22 MED ORDER — SODIUM CHLORIDE 0.9 % IV SOLN
Freq: Once | INTRAVENOUS | Status: DC
  Filled 2019-07-22: qty 250

## 2019-07-22 MED ORDER — HEPARIN SOD (PORK) LOCK FLUSH 100 UNIT/ML IV SOLN
500.0000 [IU] | Freq: Once | INTRAVENOUS | Status: AC | PRN
  Administered 2019-07-22: 500 [IU]
  Filled 2019-07-22: qty 5

## 2019-07-22 MED ORDER — OMEPRAZOLE 40 MG PO CPDR: 40.0000 mg | DELAYED_RELEASE_CAPSULE | Freq: Every day | ORAL | 1 refills | Status: DC

## 2019-07-22 NOTE — Telephone Encounter (Signed)
Please notify patient to stop the medication spironolactone which was initiated by Dr. Haroldine Laws during her last visit to the heart doctor. We also need to repeat BMP in 1 week, please schedule a lab only appointment.  Thank you Dr. Haroldine Laws!

## 2019-07-22 NOTE — Telephone Encounter (Signed)
Lets stop spiro for now. And repeat BMET 1 week> thank you for following.

## 2019-07-22 NOTE — Addendum Note (Signed)
Addended by: [name] on: 07/22/2019 04:28 PM   Modules accepted: Orders

## 2019-07-22 NOTE — Telephone Encounter (Signed)
Per noted creatinine level elevated to at 2.19, MD requested hold on treatment today.  This RN discussed above with pt including elevated glucose- pt is seeing primary MD presently for this issue.  This RN discussed need to be seen for further evaluation - by primary per above and possible cause of elevated creatinine.  Lab will be added to next treatment.  No other needs at this time.

## 2019-07-22 NOTE — Telephone Encounter (Signed)
Most recent A1c of 7.1 in August 2020, currently on Glipizide prescribed by our clinic and Farxiga prescribed by cardiology.   Wilder Glade and spironolactone initiated by cardiology in mid September which could be contributing to decreased renal function from over diuresis. Will cc Cardiology for their input. Dr. Haroldine Laws, could you take a look and provide your thoughts?

## 2019-07-23 ENCOUNTER — Ambulatory Visit (INDEPENDENT_AMBULATORY_CARE_PROVIDER_SITE_OTHER): Payer: 59 | Admitting: Internal Medicine

## 2019-07-23 ENCOUNTER — Telehealth: Payer: Self-pay

## 2019-07-23 ENCOUNTER — Encounter: Payer: Self-pay | Admitting: Internal Medicine

## 2019-07-23 DIAGNOSIS — N611 Abscess of the breast and nipple: Secondary | ICD-10-CM

## 2019-07-23 DIAGNOSIS — B20 Human immunodeficiency virus [HIV] disease: Secondary | ICD-10-CM

## 2019-07-23 DIAGNOSIS — R1310 Dysphagia, unspecified: Secondary | ICD-10-CM | POA: Insufficient documentation

## 2019-07-23 DIAGNOSIS — K1230 Oral mucositis (ulcerative), unspecified: Secondary | ICD-10-CM

## 2019-07-23 DIAGNOSIS — R1314 Dysphagia, pharyngoesophageal phase: Secondary | ICD-10-CM | POA: Insufficient documentation

## 2019-07-23 DIAGNOSIS — R1319 Other dysphagia: Secondary | ICD-10-CM | POA: Insufficient documentation

## 2019-07-23 HISTORY — DX: Dysphagia, unspecified: R13.10

## 2019-07-23 MED ORDER — AMOXICILLIN-POT CLAVULANATE 500-125 MG PO TABS: 1.0000 | ORAL_TABLET | Freq: Two times a day (BID) | ORAL | 0 refills | Status: DC

## 2019-07-23 MED ORDER — ONETOUCH VERIO W/DEVICE KIT: PACK | 0 refills | Status: DC

## 2019-07-23 MED ORDER — MAGIC MOUTHWASH: 5.0000 mL | Freq: Three times a day (TID) | ORAL | 2 refills | Status: DC | PRN

## 2019-07-23 MED ORDER — ONETOUCH VERIO VI STRP: ORAL_STRIP | 2 refills | Status: DC

## 2019-07-23 NOTE — Telephone Encounter (Signed)
Pt left v/m requesting a new diabetic meter; pt said the old meter just stopped working; one touch verio meter to [facility]

## 2019-07-23 NOTE — Assessment & Plan Note (Signed)
I will treat her with nystatin Magic mouthwash.

## 2019-07-23 NOTE — Telephone Encounter (Signed)
Sent as requested and patient have been notified.

## 2019-07-23 NOTE — Assessment & Plan Note (Signed)
Her infection remains under good long-term control.  She will continue her current antiretroviral regimen.

## 2019-07-23 NOTE — Assessment & Plan Note (Addendum)
She is improving but still has some drainage from her abscess cavity.  She is not tolerating linezolid.  I will stop it and give her a 10-day course of oral amoxicillin clavulanate, renally adjusted.  I will have her follow-up here in 2 weeks.

## 2019-07-23 NOTE — Assessment & Plan Note (Signed)
She probably has an evolving esophageal stricture related to her worsened acid reflux.  I will place a gastroenterology referral.  She is starting a PPI.

## 2019-07-23 NOTE — Telephone Encounter (Signed)
Spoken and notified patient of Tawni Millers comments. Patient verbalized understanding. Lab appt on 07/30/2019

## 2019-07-23 NOTE — Progress Notes (Signed)
Patient Active Problem List   Diagnosis Date Noted  . Cellulitis of breast 07/11/2019    Priority: High  . Abscess of breast, right 01/09/2013    Priority: High  . Malignant neoplasm of lower-inner quadrant of right breast of female, estrogen receptor positive ([place]) 05/10/2018    Priority: Medium  . Hidradenitis suppurativa of right >> left axilla 01/30/2012    Priority: Medium  . 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 Cigarette smoker 02/08/2007    Priority: Medium  . HIV disease ([place]) 07/24/2006    Priority: Medium  . Depression 07/24/2006    Priority: Medium  . Essential hypertension 07/24/2006    Priority: Medium  . Asthma 07/24/2006    Priority: Medium  . Mucositis 07/23/2019  . Dysphagia 07/23/2019  . CKD (chronic kidney disease) stage 3, GFR 30-59 ml/min 07/12/2019  . Normocytic anemia 07/12/2019  . Preventative health care 09/10/2018  . Genetic testing 07/25/2018  . Port-A-Cath in place 07/12/2018  . Family history of breast cancer   . Family history of prostate cancer   . Family history of lung cancer   . DOE (dyspnea on exertion) 10/13/2017  . Hyperlipidemia 04/26/2017  . Chronic anxiety 04/11/2017  . Upper airway cough syndrome 11/11/2016  . Morbid (severe) obesity due to excess calories ([place]) 10/14/2016  . Shortness of breath 10/13/2016  . Allergic rhinitis 02/18/2016  . Right shoulder pain 02/18/2016  . Non-insulin dependent type 2 diabetes mellitus ([place]) 06/23/2015  . Renal calculi 10/29/2014  . Recurrent boils 06/11/2014  . SARCOIDOSIS 02/08/2007  . GERD 07/24/2006    Patient's Medications  New Prescriptions   AMOXICILLIN-CLAVULANATE (AUGMENTIN) 500-125 MG TABLET    Take 1 tablet (500 mg total) by mouth 2 (two) times daily.   MAGIC MOUTHWASH SOLN    Take 5 mLs by mouth 3 (three) times daily as needed for mouth pain.  Previous Medications   AMLODIPINE (NORVASC) 10 MG TABLET    TAKE 1 TABLET BY MOUTH EVERY DAY FOR  BLOOD PRESSURE   DAPAGLIFLOZIN PROPANEDIOL (FARXIGA) 10 MG TABS TABLET    Take 10 mg by mouth daily before breakfast.   EMTRICITABINE-TENOFOVIR AF (DESCOVY) 200-25 MG TABLET    Take 1 tablet by mouth daily.   FLUCONAZOLE (DIFLUCAN) 100 MG TABLET    Take 1 tablet (100 mg total) by mouth daily.   FLUCONAZOLE (DIFLUCAN) 50 MG TABLET    Take 1 tablet (50 mg total) by mouth daily.   FLUTICASONE (FLOVENT HFA) 110 MCG/ACT INHALER    Inhale 1 puff into the lungs 2 (two) times daily.   GLIPIZIDE (GLUCOTROL) 5 MG TABLET    Take 1 tablet (5 mg total) by mouth 2 (two) times daily before a meal. For diabetes.   HYDROCODONE-ACETAMINOPHEN (NORCO/VICODIN) 5-325 MG TABLET    Take 1 tablet by mouth every 8 (eight) hours as needed for moderate pain.   IRBESARTAN-HYDROCHLOROTHIAZIDE (AVALIDE) 150-12.5 MG TABLET    TAKE 1 TABLET BY MOUTH ONCE DAILY FOR BLOOD PRESSURE.   LEVOTHYROXINE (SYNTHROID) 175 MCG TABLET    TAKE 1 TABLET BY MOUTH EVERY MORNING ON AN EMPTY STOMACH WITH A FULL GLASS OF WATER.   METOPROLOL SUCCINATE (TOPROL-XL) 50 MG 24 HR TABLET    TAKE 1 TABLET (50 MG TOTAL) BY MOUTH DAILY. TAKE WITH OR IMMEDIATELY FOLLOWING A MEAL.   MULTIPLE VITAMIN (MULTIVITAMIN) TABLET    Take 1 tablet by mouth daily.   OMEPRAZOLE (PRILOSEC) 40  MG CAPSULE    Take 1 capsule (40 mg total) by mouth daily.   ONDANSETRON (ZOFRAN) 8 MG TABLET    Take 1 tablet (8 mg total) by mouth every 8 (eight) hours as needed for nausea or vomiting.   ONE TOUCH LANCETS MISC    Use as instructed to test blood sugar daily   ONETOUCH VERIO TEST STRIP    USE AS INSTRUCTED TO TEST BLOOD SUGAR DAILY NEED APPOINTMENT FOR ANY MORE REFILLS   ROSUVASTATIN (CRESTOR) 10 MG TABLET    Take 1 tablet (10 mg total) by mouth daily. For cholesterol.   SPIRONOLACTONE (ALDACTONE) 25 MG TABLET    Take 1 tablet (25 mg total) by mouth daily.   TAMOXIFEN (NOLVADEX) 20 MG TABLET    Take 1 tablet (20 mg total) by mouth daily.   [name] 50 MG TABLET    TAKE 1 TABLET BY  MOUTH EVERY DAY  Modified Medications   No medications on file  Discontinued Medications   LINEZOLID (ZYVOX) 600 MG TABLET    Take 1 tablet (600 mg total) by mouth 2 (two) times daily for 14 days.    Subjective: [name] was recently hospitalized with a right breast abscess.  Underwent aspiration of the abscess.  Gram stain revealed gram-positive cocci.  She was discharged on oral linezolid pending final cultures which grew Peptostreptococcus.  She is doing better but still has a little bit of drainage from the aspiration site.  The pain and swelling around her nipple is gone but she has some new pain in her upper breast in the 11 o'clock position.  She has developed vaginal discharge with itching and a very sore mouth and tone since she left the hospital.  She is also been bothered by nausea and occasional vomiting that she attributes to the oral linezolid.  She has had no problems obtaining, taking or tolerating her Descovy and [name] and does not recall missing doses.  She has been having more problems with acid indigestion and dysphasia with solid foods recently.  Review of Systems: Review of Systems  Constitutional: Negative for fever.  HENT: Negative for congestion.        Very sore mouth and tongue with cracking at the corners of her mouth.  Respiratory: Negative for cough and shortness of breath.   Cardiovascular: Negative for chest pain.  Gastrointestinal: Positive for heartburn, nausea and vomiting.       Recent dysphasia with solids  Genitourinary: Negative for dysuria.  Skin: Negative for rash.  Neurological: Negative for headaches.  Psychiatric/Behavioral: Negative for depression.    Past Medical History:  Diagnosis Date  . Allergy   . Anemia    Normocytic  . Anxiety   . Asthma   . Blood dyscrasia   . Bronchitis 2005  . 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 Diabetes mellitus without complication ([place])   . Family history of breast cancer   . Family history of  lung cancer   . Family history of prostate cancer   . Gastroenteritis 07/10/07  . GERD 07/24/2006  . GRAVE'S DISEASE 01/01/2008  . History of hidradenitis suppurativa   . History of kidney stones   . History of thrush   . HIV DISEASE 07/24/2006   dx March 05  . Hyperlipidemia   . HYPERTENSION 07/24/2006  . Hyperthyroidism 08/2006   Grave's Disease -diffuse radiotracer uptake 08/25/06 Thyroid scan-Cold nodule to R lower lobe of thyrorid  . Menometrorrhagia    hx of  . Nephrolithiasis   .  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 Personal history of chemotherapy    2020  . Personal history of radiation therapy    2020  . Pneumonia 2005  . 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 Sarcoidosis 02/08/2007   dx as a teenager in [place] from abnl CXR. Completed 2 yrs Prednisone after lung bx confirmation. No symptoms since then.  . Suppurative hidradenitis   . Thyroid cancer ([place])   . THYROID NODULE, RIGHT 02/08/2007    Social History   Tobacco Use  . Smoking status: Former Smoker    Packs/day: 0.50    Years: 15.00    Pack years: 7.50    Types: Cigarettes    Start date: 04/12/2017  . Smokeless tobacco: Never Used  . Tobacco comment: quit smoking  may 2018  Substance Use Topics  . Alcohol use: Yes    Alcohol/week: 0.0 standard drinks    Comment: social  . Drug use: No    Family History  Problem Relation Age of Onset  . Hypertension Mother   . Cancer Mother        laryngeal  . Heart disease Mother        stent  . Hypertension Father   . Lung cancer Father 25       hx smoking  . Heart disease Other   . Hypertension Other   . Stroke Other        Grandparent  . Kidney disease Other        Grandparent  . Diabetes Other        FH of Diabetes  . Hypertension Sister   . Cancer Maternal Uncle        Lung CA  . Hypertension Brother   . Hypertension Sister   . Breast cancer Maternal Aunt 65  . Breast cancer  Paternal Aunt 7  . Prostate cancer Paternal Uncle   . Breast cancer Maternal Aunt        dx 60+  . Breast cancer Paternal Aunt        dx 41's  . Breast cancer Paternal Aunt        dx 19's  . Prostate cancer Paternal Uncle   . Lung cancer Paternal Uncle   . Breast cancer Cousin 30  . Breast cancer Cousin        dx <50  . Breast cancer Cousin        dx <50  . Breast cancer Cousin        dx <50  . Colon cancer Neg Hx   . Esophageal cancer Neg Hx   . Rectal cancer Neg Hx   . Stomach cancer Neg Hx     Allergies  Allergen Reactions  . Genvoya [Elviteg-Cobic-Emtricit-Tenofaf] Hives  . Lisinopril Cough  . Valsartan Other (See Comments) and Cough    Pt states she tolerates medicine now    Health Maintenance  Topic Date Due  . PAP SMEAR-Modifier  01/28/2014  . OPHTHALMOLOGY EXAM  07/15/2016  . FOOT EXAM  08/25/2019  . TETANUS/TDAP  10/18/2019  . HEMOGLOBIN A1C  12/06/2019  . MAMMOGRAM  06/05/2021  . COLONOSCOPY  11/16/2028  . INFLUENZA VACCINE  Completed  . PNEUMOCOCCAL POLYSACCHARIDE VACCINE AGE 72-64 HIGH RISK  Completed  . HIV Screening  Completed    Objective:  Vitals:   07/23/19 1344  BP: 118/80  Pulse: 76  Temp: 98 F (36.7 C)   There is no height or weight  on file to calculate BMI.  Physical Exam Constitutional:      Comments: She is in good spirits as usual.  HENT:     Mouth/Throat:     Comments: Diffuse redness of her buccal mucosa and tongue.  Perleche. Eyes:     Conjunctiva/sclera: Conjunctivae normal.  Cardiovascular:     Rate and Rhythm: Normal rate and regular rhythm.     Heart sounds: No murmur.  Pulmonary:     Effort: Pulmonary effort is normal.     Breath sounds: Normal breath sounds.  Chest:    Psychiatric:        Mood and Affect: Mood normal.     Lab Results Lab Results  Component Value Date   WBC 8.9 07/22/2019   HGB 11.8 (L) 07/22/2019   HCT 35.3 (L) 07/22/2019   MCV 91.7 07/22/2019   PLT 176 07/22/2019    Lab Results   Component Value Date   CREATININE 2.17 (H) 07/22/2019   BUN 30 (H) 07/22/2019   NA 139 07/22/2019   K 4.1 07/22/2019   CL 102 07/22/2019   CO2 26 07/22/2019    Lab Results  Component Value Date   ALT 20 07/22/2019   AST 29 07/22/2019   ALKPHOS 93 07/22/2019   BILITOT <0.2 (L) 07/22/2019    Lab Results  Component Value Date   CHOL 263 (H) 06/05/2019   HDL 39.60 06/05/2019   LDLCALC 61 10/23/2017   LDLDIRECT 171.0 06/05/2019   TRIG 255.0 (H) 06/05/2019   CHOLHDL 7 06/05/2019   Lab Results  Component Value Date   LABRPR NON-REACTIVE 10/23/2017   07/12/2019 HIV viral load 30  HIV 1 RNA Quant (copies/mL)  Date Value  09/27/2018 <20 DETECTED (A)  07/11/2018 <20 DETECTED (A)  05/28/2018 <20 NOT DETECTED   CD4 T Cell Abs (/uL)  Date Value  07/12/2019 778  09/27/2018 1,110  07/11/2018 1,070     Problem List Items Addressed This Visit      High   Abscess of breast, right    She is improving but still has some drainage from her abscess cavity.  She is not tolerating linezolid.  I will stop it and give her a 10-day course of oral amoxicillin clavulanate, renally adjusted.  I will have her follow-up here in 2 weeks.      Relevant Medications   amoxicillin-clavulanate (AUGMENTIN) 500-125 MG tablet     Medium   HIV disease ([place])    Her infection remains under good long-term control.  She will continue her current antiretroviral regimen.      Relevant Medications   magic mouthwash SOLN     Unprioritized   Mucositis    I will treat her with nystatin Magic mouthwash.      Relevant Medications   magic mouthwash SOLN   Dysphagia    She probably has an evolving esophageal stricture related to her worsened acid reflux.  I will place a gastroenterology referral.  She is starting a PPI.      Relevant Orders   Ambulatory referral to Gastroenterology        Michel Bickers, MD [facility] for [place] [phone] pager    [phone] cell 07/23/2019, 2:33 PM

## 2019-07-23 NOTE — Progress Notes (Signed)
Noted printed Rx.  Magic Mouthwas SOLN rx called into pharmacy.  Eugenia Mcalpine

## 2019-07-24 ENCOUNTER — Encounter: Payer: Self-pay | Admitting: General Practice

## 2019-07-24 ENCOUNTER — Other Ambulatory Visit: Payer: Self-pay | Admitting: Primary Care

## 2019-07-24 NOTE — Progress Notes (Signed)
[place] CSW Progress Notes  Call to patient to follow up on needs for financial assistance and housing concerns - no answer, VM full.  Edwyna Shell, LCSW Clinical Social Worker Phone:  [phone] Cell:  [phone]

## 2019-07-25 MED ORDER — ONETOUCH ULTRASOFT LANCETS MISC: 5 refills | Status: DC

## 2019-07-26 ENCOUNTER — Encounter: Payer: Self-pay | Admitting: General Practice

## 2019-07-26 NOTE — Progress Notes (Signed)
[place] CSW Progress Notes  Email from [employer]  - after assessment they will pay her rent for the month of Nov and her utility bill.   As patient doesn't have any household income presently and no promise to return to her job after the doctor releases her,, they will not continue to assist. Pt stated that she would have to apply all over again to get her job back.  If her plans come through, by Dec she will have income to pay bills going forward.    Edwyna Shell, LCSW Clinical Social Worker Phone:  [phone]

## 2019-07-30 ENCOUNTER — Other Ambulatory Visit: Payer: Self-pay

## 2019-07-30 ENCOUNTER — Encounter (HOSPITAL_COMMUNITY): Payer: Self-pay | Admitting: Internal Medicine

## 2019-07-30 ENCOUNTER — Ambulatory Visit (HOSPITAL_COMMUNITY)
Admission: RE | Admit: 2019-07-30 | Discharge: 2019-07-30 | Disposition: A | Discharge status: Home / Self Care | Payer: 59 | Source: Ambulatory Visit | Attending: Internal Medicine | Admitting: Internal Medicine

## 2019-07-30 ENCOUNTER — Other Ambulatory Visit: Payer: 59

## 2019-07-30 VITALS — BP 128/88 | HR 87 | Wt 235.8 lb

## 2019-07-30 DIAGNOSIS — B20 Human immunodeficiency virus [HIV] disease: Secondary | ICD-10-CM | POA: Diagnosis not present

## 2019-07-30 DIAGNOSIS — I5022 Chronic systolic (congestive) heart failure: Secondary | ICD-10-CM | POA: Diagnosis not present

## 2019-07-30 DIAGNOSIS — Z853 Personal history of malignant neoplasm of breast: Secondary | ICD-10-CM | POA: Diagnosis not present

## 2019-07-30 DIAGNOSIS — N183 Chronic kidney disease, stage 3 unspecified: Secondary | ICD-10-CM | POA: Diagnosis not present

## 2019-07-30 DIAGNOSIS — E669 Obesity, unspecified: Secondary | ICD-10-CM | POA: Insufficient documentation

## 2019-07-30 DIAGNOSIS — Z87891 Personal history of nicotine dependence: Secondary | ICD-10-CM | POA: Insufficient documentation

## 2019-07-30 DIAGNOSIS — E785 Hyperlipidemia, unspecified: Secondary | ICD-10-CM | POA: Insufficient documentation

## 2019-07-30 DIAGNOSIS — K219 Gastro-esophageal reflux disease without esophagitis: Secondary | ICD-10-CM | POA: Insufficient documentation

## 2019-07-30 DIAGNOSIS — R0683 Snoring: Secondary | ICD-10-CM | POA: Diagnosis not present

## 2019-07-30 DIAGNOSIS — Z8249 Family history of ischemic heart disease and other diseases of the circulatory system: Secondary | ICD-10-CM | POA: Insufficient documentation

## 2019-07-30 DIAGNOSIS — I130 Hypertensive heart and chronic kidney disease with heart failure and stage 1 through stage 4 chronic kidney disease, or unspecified chronic kidney disease: Secondary | ICD-10-CM | POA: Diagnosis not present

## 2019-07-30 DIAGNOSIS — E0500 Thyrotoxicosis with diffuse goiter without thyrotoxic crisis or storm: Secondary | ICD-10-CM | POA: Diagnosis not present

## 2019-07-30 DIAGNOSIS — I509 Heart failure, unspecified: Secondary | ICD-10-CM | POA: Insufficient documentation

## 2019-07-30 DIAGNOSIS — Z79899 Other long term (current) drug therapy: Secondary | ICD-10-CM | POA: Insufficient documentation

## 2019-07-30 DIAGNOSIS — I10 Essential (primary) hypertension: Secondary | ICD-10-CM | POA: Diagnosis not present

## 2019-07-30 DIAGNOSIS — Z7984 Long term (current) use of oral hypoglycemic drugs: Secondary | ICD-10-CM | POA: Diagnosis not present

## 2019-07-30 DIAGNOSIS — I2510 Atherosclerotic heart disease of native coronary artery without angina pectoris: Secondary | ICD-10-CM | POA: Diagnosis not present

## 2019-07-30 DIAGNOSIS — E1122 Type 2 diabetes mellitus with diabetic chronic kidney disease: Secondary | ICD-10-CM | POA: Diagnosis not present

## 2019-07-30 DIAGNOSIS — J45909 Unspecified asthma, uncomplicated: Secondary | ICD-10-CM | POA: Insufficient documentation

## 2019-07-30 DIAGNOSIS — Z923 Personal history of irradiation: Secondary | ICD-10-CM | POA: Insufficient documentation

## 2019-07-30 DIAGNOSIS — E039 Hypothyroidism, unspecified: Secondary | ICD-10-CM | POA: Insufficient documentation

## 2019-07-30 DIAGNOSIS — D649 Anemia, unspecified: Secondary | ICD-10-CM | POA: Diagnosis not present

## 2019-07-30 DIAGNOSIS — C50411 Malignant neoplasm of upper-outer quadrant of right female breast: Secondary | ICD-10-CM | POA: Diagnosis not present

## 2019-07-30 LAB — BASIC METABOLIC PANEL
Anion gap: 13 (ref 5–15)
BUN: 21 mg/dL — ABNORMAL HIGH (ref 6–20)
CO2: 21 mmol/L — ABNORMAL LOW (ref 22–32)
Calcium: 9 mg/dL (ref 8.9–10.3)
Chloride: 104 mmol/L (ref 98–111)
Creatinine, Ser: 1.85 mg/dL — ABNORMAL HIGH (ref 0.44–1.00)
GFR calc Af Amer: 36 mL/min — ABNORMAL LOW (ref >60)
GFR calc non Af Amer: 31 mL/min — ABNORMAL LOW (ref >60)
Glucose, Bld: 148 mg/dL — ABNORMAL HIGH (ref 70–99)
Potassium: 3.7 mmol/L (ref 3.5–5.1)
Sodium: 138 mmol/L (ref 135–145)

## 2019-07-30 NOTE — Patient Instructions (Signed)
Labs today We will only contact you if something comes back abnormal or we need to make some changes. Otherwise no news is good news!  You have been referred to [facility]- they will in contact with you for an appointment Address: [address], [place],  [address] Phone: [phone]  Your provider has recommended that you have a home sleep study.  [employer] is the company that does these test.  They will contact you by phone and must speak with you before they can ship the equipment.  Once they have spoken with you they will send the equipment right to your home with instructions on how to set it up.  Once you have completed the test you just dispose of the equipment, the information is automatically uploaded to [place] via blue-tooth technology.  IF you have any questions or issues with the equipment please call the company directly at [phone].  If your test is positive for sleep apnea and you need a home CPAP machine you will be contacted by Dr Theodosia Blender office [facility]) to set this up.   Your physician recommends that you schedule a follow-up appointment in: 4 months with an echo  Your physician has requested that you have an echocardiogram. Echocardiography is a painless test that uses sound waves to create images of your heart. It provides your doctor with information about the size and shape of your heart and how well your heart's chambers and valves are working. This procedure takes approximately one hour. There are no restrictions for this procedure.

## 2019-07-30 NOTE — Progress Notes (Signed)
Patient Name:         DOB:       Height:     Weight:  Office Name:         Referring Provider:  Today's Date:  Date:   STOP BANG RISK ASSESSMENT S (snore) Have you been told that you snore?     YES   T (tired) Are you often tired, fatigued, or sleepy during the day?   YES  O (obstruction) Do you stop breathing, choke, or gasp during sleep? YES   P (pressure) Do you have or are you being treated for high blood pressure? YES   B (BMI) Is your body index greater than 35 kg/m? YES   A (age) Are you 16 years old or older? YES   N (neck) Do you have a neck circumference greater than 16 inches?   NO   G (gender) Are you a female? NO   TOTAL STOP/BANG "YES" ANSWERS                                                                        For Office Use Only              Procedure Order Form    YES to 3+ Stop Bang questions OR two clinical symptoms - patient qualifies for WatchPAT (CPT 95800)     Submit: This Form + Patient Face Sheet + Clinical Note via CloudPAT or Fax: [phone]         Clinical Notes: Will consult Sleep Specialist and refer for management of therapy due to patient increased risk of Sleep Apnea. Ordering a sleep study due to the following two clinical symptoms: Excessive daytime sleepiness G47.10 / Gastroesophageal reflux K21.9 / Nocturia R35.1 / Morning Headaches G44.221 / Difficulty concentrating R41.840 / Memory problems or poor judgment G31.84 / Personality changes or irritability R45.4 / Loud snoring R06.83 / Depression F32.9 / Unrefreshed by sleep G47.8 / Impotence N52.9 / History of high blood pressure R03.0 / Insomnia G47.00    I understand that I am proceeding with a home sleep apnea test as ordered by my treating physician. I understand that untreated sleep apnea is a serious cardiovascular risk factor and it is my responsibility to perform the test and seek management for sleep apnea. I will be contacted with the results and be managed for sleep apnea by a  local sleep physician. I will be receiving equipment and further instructions from [facility]. I shall promptly ship back the equipment via the included mailing label. I understand my insurance will be billed for the test and as the patient I am responsible for any insurance related out-of-pocket costs incurred. I have been provided with written instructions and can call for additional video or telephonic instruction, with 24-hour availability of qualified personnel to answer any questions: Patient Help Desk [phone].  Patient Signature ______________________________________________________   Date______________________ Patient Telemedicine Verbal Consent

## 2019-07-30 NOTE — Addendum Note (Signed)
Encounter addended by: Kerry Dory, CMA on: 07/30/2019 12:35 PM  Actions taken: Charge Capture section accepted

## 2019-07-30 NOTE — Progress Notes (Signed)
Cardio-oncology Clinic Note    Date:  07/30/2019   ID:  Aletha, Allebach 05-Jan-1966, MRN [id number]  Location: Home  Provider location: [facility] Type of Visit: Established patient  PCP:  [name], NP  Cardiologist:  No primary care provider on file. Primary HF: Jamine Highfill  Chief Complaint: Heart Failure follow-up   History of Present Illness:  [name] is a  53 y.o. female with h/o DM, Graves disease, thyroid CA, HIV, HTN and right breast cancer referred by Dr. Doris Cheadle for enrollment into the Cardio-Oncology program for EF monitoring during chemotherapy  Right breast CA -  upper inner quadrant biopsy 04/26/2018 for a clinical T2 N0, stage Ib invasive ductal carcinoma, grade 2, with an MIB-1 of 70%.   (1) neoadjuvant chemotherapy with carboplatin, docetaxel, trastuzumab and Pertuzumab given every 21 days x 6, starting 05/31/2018 (a) Pertuzumab discontinued starting with cycle 2 because of diarrhea problems  (3) trastuzumab to continue to total 6 months (a) echo on 05/16/2018 shows an EF of 60-65%. (b) echo on 08/27/18 shows an EF 55-60%             (c) echo on 12/03/18 EF 60-65% GLS -18.8  (d) echo on 03/06/19 EF 55% (read as 40-45%) GLS 15.7% - underestimated  (4) s/p lumpectomy 12/19  (5) adjuvant radiation completed 5/20  (6) antiestrogens to start at the completion of local treatment  We saw he in September for increased SOB. Felt it was volume overload. ReDs was 39%. BP up. Started on spiro and Farxiga but creatinine bumped. Arlyce Harman stopped. Remains on Herceptin but did not get last dose due to AKI. Remains SOB with mild activity. No orthopnea or PND. No CP.    CXR 05/24/19: No acute disease   Echo 06/20/19 EF 55-60% Grade 1 DD GLS not measured. Personally reviewed  CT scan 12/19 with LAD calcification     Echo on 03/06/19 EF 55-60% (read as 40-45%) GLS 15.7% - underestimated   Past Medical History:  Diagnosis Date  . Allergy   . Anemia    Normocytic  . Anxiety   . Asthma   . Blood dyscrasia   . Bronchitis 2005  . 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 Diabetes mellitus without complication ([place])   . Family history of breast cancer   . Family history of lung cancer   . Family history of prostate cancer   . Gastroenteritis 07/10/07  . GERD 07/24/2006  . GRAVE'S DISEASE 01/01/2008  . History of hidradenitis suppurativa   . History of kidney stones   . History of thrush   . HIV DISEASE 07/24/2006   dx March 05  . Hyperlipidemia   . HYPERTENSION 07/24/2006  . Hyperthyroidism 08/2006   Grave's Disease -diffuse radiotracer uptake 08/25/06 Thyroid scan-Cold nodule to R lower lobe of thyrorid  . Menometrorrhagia    hx of  . Nephrolithiasis   . 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 Personal history of chemotherapy    2020  . Personal history of radiation therapy    2020  . Pneumonia 2005  . 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 Sarcoidosis 02/08/2007   dx as a teenager in [place] from abnl CXR. Completed 2 yrs Prednisone after lung bx confirmation. No symptoms since then.  . Suppurative hidradenitis   . Thyroid cancer ([place])   . THYROID NODULE, RIGHT 02/08/2007   Past Surgical History:  Procedure Laterality Date  .  BREAST EXCISIONAL BIOPSY Right 04/26/2018   right axilla negative  . BREAST EXCISIONAL BIOPSY Left 04/26/2018   left axilla negative  . BREAST LUMPECTOMY Right 10/03/2018   malignant  . BREAST LUMPECTOMY WITH RADIOACTIVE SEED AND SENTINEL LYMPH NODE BIOPSY Right 10/03/2018   Procedure: RIGHT BREAST LUMPECTOMY WITH RADIOACTIVE SEED AND SENTINEL LYMPH NODE MAPPING;  Surgeon: Erroll Luna, MD;  Location: [facility];  Service: General;  Laterality: Right;  . BREAST SURGERY  1997   Breast Reduction   . CYSTOSCOPY W/ URETERAL STENT REMOVAL  11/09/2012   Procedure:  CYSTOSCOPY WITH STENT REMOVAL;  Surgeon: Alexis Frock, MD;  Location: [facility];  Service: Urology;  Laterality: Right;  . CYSTOSCOPY WITH RETROGRADE PYELOGRAM, URETEROSCOPY AND STENT PLACEMENT  11/09/2012   Procedure: CYSTOSCOPY WITH RETROGRADE PYELOGRAM, URETEROSCOPY AND STENT PLACEMENT;  Surgeon: Alexis Frock, MD;  Location: [facility];  Service: Urology;  Laterality: Left;  LEFT URETEROSCOPY, STONE MANIPULATION, left STENT exchange   . CYSTOSCOPY WITH STENT PLACEMENT  10/02/2012   Procedure: CYSTOSCOPY WITH STENT PLACEMENT;  Surgeon: Alexis Frock, MD;  Location: [facility];  Service: Urology;  Laterality: Left;  . DILATION AND CURETTAGE OF UTERUS  Feb 2004   s/p for 1st trimester nonviable pregnancy  . EYE SURGERY     sty under eyelid  . INCISE AND DRAIN ABCESS  Nov 03   s/p I &D for righ inframmary fold hidradenitis  . INCISION AND DRAINAGE PERITONSILLAR ABSCESS  Mar 03  . IR CV LINE INJECTION  06/07/2018  . IR IMAGING GUIDED PORT INSERTION  06/20/2018  . IR REMOVAL TUN ACCESS W/ PORT W/O FL MOD SED  06/20/2018  . IRRIGATION AND DEBRIDEMENT ABSCESS  01/31/2012   Procedure: IRRIGATION AND DEBRIDEMENT ABSCESS;  Surgeon: Shann Medal, MD;  Location: [facility];  Service: General;  Laterality: Right;  right breast and axilla   . NEPHROLITHOTOMY  10/02/2012   Procedure: NEPHROLITHOTOMY PERCUTANEOUS;  Surgeon: Alexis Frock, MD;  Location: [facility];  Service: Urology;  Laterality: Right;  First Stage Percutaneous Nephrolithotomy with Surgeon Access, Left Ureteral Stent    . NEPHROLITHOTOMY  10/04/2012   Procedure: NEPHROLITHOTOMY PERCUTANEOUS SECOND LOOK;  Surgeon: Alexis Frock, MD;  Location[facility];  Service: Urology;  Laterality: Right;     . NEPHROLITHOTOMY  10/08/2012   Procedure: NEPHROLITHOTOMY PERCUTANEOUS;  Surgeon: Alexis Frock, MD;  Location[facility];  Service: Urology;  Laterality: Right;  THIRD STAGE, nephrostomy tube exchange x 2  . NEPHROLITHOTOMY  10/11/2012   Procedure: NEPHROLITHOTOMY  PERCUTANEOUS SECOND LOOK;  Surgeon: Alexis Frock, MD;  Location[facility];  Service: Urology;  Laterality: Right;  RIGHT 4 STAGE PERCUTANOUS NEPHROLITHOTOMY, right URETEROSCOPY WITH HOLMIUM LASER   . PORTACATH PLACEMENT Left 05/17/2018   Procedure: INSERTION PORT-A-CATH;  Surgeon: Coralie Keens, MD;  Location: [facility];  Service: General;  Laterality: Left;  . RADICAL NECK DISSECTION  04/12/2017   limited/notes 04/12/2017  . RADICAL NECK DISSECTION N/A 04/12/2017   Procedure: RADICAL NECK DISSECTION;  Surgeon: Melida Quitter, MD;  Location: [facility];  Service: ENT;  Laterality: N/A;  limited neck dissection 2 hours total  . REDUCTION MAMMAPLASTY Bilateral 1998  . [place]  1994  . THYROIDECTOMY  04/12/2017   completion/notes 04/12/2017  . THYROIDECTOMY N/A 04/12/2017   Procedure: THYROIDECTOMY;  Surgeon: Melida Quitter, MD;  Location: [facility];  Service: ENT;  Laterality: N/A;  Completion Thyroidectomy  . TOTAL THYROIDECTOMY  2010     Current Outpatient Medications  Medication Sig Dispense Refill  .  amLODipine (NORVASC) 10 MG tablet TAKE 1 TABLET BY MOUTH EVERY DAY FOR BLOOD PRESSURE (Patient taking differently: Take 10 mg by mouth daily. ) 90 tablet 1  . Blood Glucose Monitoring Suppl (ONETOUCH VERIO) w/Device KIT USE AS INSTRUCTED TO TEST BLOOD SUGAR UP TO 4 TIMES DAILY E11.9 1 kit 0  . dapagliflozin propanediol (FARXIGA) 10 MG TABS tablet Take 10 mg by mouth daily before breakfast. 30 tablet 6  . emtricitabine-tenofovir AF (DESCOVY) 200-25 MG tablet Take 1 tablet by mouth daily. 30 tablet 2  . fluconazole (DIFLUCAN) 100 MG tablet Take 1 tablet (100 mg total) by mouth daily. 20 tablet 0  . fluconazole (DIFLUCAN) 50 MG tablet Take 1 tablet (50 mg total) by mouth daily. 7 tablet 0  . fluticasone (FLOVENT HFA) 110 MCG/ACT inhaler Inhale 1 puff into the lungs 2 (two) times daily. 1 Inhaler 2  . glipiZIDE (GLUCOTROL) 5 MG tablet Take 5 mg by mouth daily.    Marland Kitchen glucose blood (ONETOUCH  VERIO) test strip USE AS INSTRUCTED TO TEST BLOOD SUGAR DAILY 100 each 2  . HYDROcodone-acetaminophen (NORCO/VICODIN) 5-325 MG tablet Take 1 tablet by mouth every 8 (eight) hours as needed for moderate pain. 15 tablet 0  . irbesartan-hydrochlorothiazide (AVALIDE) 150-12.5 MG tablet TAKE 1 TABLET BY MOUTH ONCE DAILY FOR BLOOD PRESSURE. (Patient taking differently: Take 1 tablet by mouth daily. ) 90 tablet 1  . Lancets (ONETOUCH ULTRASOFT) lancets Use as instructed to test blood sugar daily 100 each 5  . levothyroxine (SYNTHROID) 175 MCG tablet TAKE 1 TABLET BY MOUTH EVERY MORNING ON AN EMPTY STOMACH WITH A FULL GLASS OF WATER. (Patient taking differently: Take 175 mcg by mouth daily before breakfast. TAKE  ON AN EMPTY STOMACH WITH A FULL GLASS OF WATER.) 90 tablet 0  . magic mouthwash SOLN Take 5 mLs by mouth 3 (three) times daily as needed for mouth pain. 150 mL 2  . metoprolol succinate (TOPROL-XL) 50 MG 24 hr tablet TAKE 1 TABLET (50 MG TOTAL) BY MOUTH DAILY. TAKE WITH OR IMMEDIATELY FOLLOWING A MEAL. 90 tablet 0  . omeprazole (PRILOSEC) 40 MG capsule Take 1 capsule (40 mg total) by mouth daily. 60 capsule 1  . rosuvastatin (CRESTOR) 10 MG tablet Take 1 tablet (10 mg total) by mouth daily. For cholesterol. 90 tablet 0  . tamoxifen (NOLVADEX) 20 MG tablet Take 1 tablet (20 mg total) by mouth daily. 30 tablet 5  . [name] 50 MG tablet TAKE 1 TABLET BY MOUTH EVERY DAY (Patient taking differently: Take 50 mg by mouth daily. ) 30 tablet 2  . ondansetron (ZOFRAN) 8 MG tablet Take 1 tablet (8 mg total) by mouth every 8 (eight) hours as needed for nausea or vomiting. (Patient not taking: Reported on 07/30/2019) 40 tablet 2   No current facility-administered medications for this encounter.    Facility-Administered Medications Ordered in Other Encounters  Medication Dose Route Frequency Provider Last Rate Last Dose  . sodium chloride flush (NS) 0.9 % injection 10 mL  10 mL Intracatheter PRN Magrinat, Virgie Dad, MD   10 mL at 09/19/18 1551    Allergies:   Genvoya [elviteg-cobic-emtricit-tenofaf], Lisinopril, and Valsartan   Social History:  The patient  reports that she has quit smoking. Her smoking use included cigarettes. She started smoking about 2 years ago. She has a 7.50 pack-year smoking history. She has never used smokeless tobacco. She reports current alcohol use. She reports that she does not use drugs.   Family History:  The patient's family history includes Breast cancer in her cousin, cousin, cousin, maternal aunt, paternal aunt, and paternal aunt; Breast cancer (age of onset: 79) in her cousin; Breast cancer (age of onset: 47) in her paternal aunt; Breast cancer (age of onset: 17) in her maternal aunt; Cancer in her maternal uncle and mother; Diabetes in an other family member; Heart disease in her mother and another family member; Hypertension in her brother, father, mother, sister, sister, and another family member; Kidney disease in an other family member; Lung cancer in her paternal uncle; Lung cancer (age of onset: 21) in her father; Prostate cancer in her paternal uncle and paternal uncle; Stroke in an other family member.   ROS:  Please see the history of present illness.   All other systems are personally reviewed and negative.   Vitals:   07/30/19 1057  BP: 128/88  Pulse: 87  SpO2: 98%    Exam:  General:  Well appearing. No resp difficulty HEENT: normal Neck: supple. no JVD. Carotids 2+ bilat; no bruits. No lymphadenopathy or thryomegaly appreciated. Cor: PMI nondisplaced. Regular rate & rhythm. No rubs, gallops or murmurs. Lungs: clear Abdomen: obese soft, nontender, nondistended. No hepatosplenomegaly. No bruits or masses. Good bowel sounds. Extremities: no cyanosis, clubbing, rash, edema Neuro: alert & orientedx3, cranial nerves grossly intact. moves all 4 extremities w/o difficulty. Affect pleasant   Recent Labs: 07/11/2019: B Natriuretic Peptide 22.5; TSH 17.98  07/22/2019: ALT 20; BUN 30; Creatinine, Ser 2.17; Hemoglobin 11.8; Platelets 176; Potassium 4.1; Sodium 139  Personally reviewed   Wt Readings from Last 3 Encounters:  07/30/19 107 kg (235 lb 12.8 oz)  07/22/19 107.5 kg (237 lb 1.6 oz)  07/18/19 106.5 kg (234 lb 12 oz)      ASSESSMENT AND PLAN:  1. Dyspnea - echo 9/20  EF 55-60% Grade I DD  - Initial ReDS 43% suggesting elevated lung water -> likely early diastolic HF in the setting of CKD and poorly-controlled HTN - On Farxiga  - Spiro stopped to A/CKD - Volume status ok on exam - Needs weight loss - Check sleep study  2. R Breast Cancer - EF and Doppler parameters stabl e and echo  - can resume Herceptin from cardiac standpoint (once renal function stabilizes)  3. HTN - Blood pressure well controlled. Continue current regimen. - Repeat BMET - Refer to [facility]  4. Acute on CKD3 - baseline creatinine 1.5-1.9. Most recent 2.1. Arlyce Harman stopped.  - Refer to [facility]  5. Snoring - likely has OSA - will re-order home sleep study  6. DM2 - last HGBa1c 7.1 - Continue Farxiga  7. Obesity - encouraged weight loss    8. Coronary calcifications on CT - doubt obstructive - continue statin. Goal LDL < 70  Signed, Glori Bickers, MD  07/30/2019 11:23 AM  [facility] [place] [phone] (office) [phone] (fax)

## 2019-07-30 NOTE — Addendum Note (Signed)
Encounter addended by: Kerry Dory, CMA on: 07/30/2019 3:48 PM  Actions taken: Order list changed, Diagnosis association updated

## 2019-08-05 ENCOUNTER — Other Ambulatory Visit: Payer: Self-pay

## 2019-08-06 ENCOUNTER — Other Ambulatory Visit: Payer: Self-pay

## 2019-08-06 ENCOUNTER — Ambulatory Visit (INDEPENDENT_AMBULATORY_CARE_PROVIDER_SITE_OTHER): Payer: 59 | Admitting: Internal Medicine

## 2019-08-06 DIAGNOSIS — B20 Human immunodeficiency virus [HIV] disease: Secondary | ICD-10-CM | POA: Diagnosis not present

## 2019-08-06 NOTE — Progress Notes (Signed)
Virtual Visit via Telephone Note  I connected with [name] on 08/06/19 at  2:45 PM EDT by telephone and verified that I am speaking with the correct person using two identifiers.  Location: Patient: Home Provider: [facility]   I discussed the limitations, risks, security and privacy concerns of performing an evaluation and management service by telephone and the availability of in person appointments. I also discussed with the patient that there may be a patient responsible charge related to this service. The patient expressed understanding and agreed to proceed.   History of Present Illness: I spoke to [name] by phone today.  She is feeling much better.  She completed amoxicillin clavulanate for her right breast abscess.  She is having no more pain or drainage.  Once she stopped linezolid and started back on her proton pump inhibitor her nausea and vomiting resolved promptly.  The sores in her mouth have improved dramatically.  She denies missing any doses of her Descovy or [name].   Observations/Objective: HIV 1 RNA Quant (copies/mL)  Date Value  09/27/2018 <20 DETECTED (A)  07/11/2018 <20 DETECTED (A)  05/28/2018 <20 NOT DETECTED   CD4 T Cell Abs (/uL)  Date Value  07/12/2019 778  09/27/2018 1,110  07/11/2018 1,070     Assessment and Plan: [name] has HIV is under very good long-term control.  She will continue her current antiretroviral regimen and follow-up here in 3 months.  Her right breast abscess has resolved.  I suspect that her nausea and vomiting was due to a combination of linezolid and a flare of her GERD.  Follow Up Instructions: Continue Descovy and [name] Follow-up here in 3 months   I discussed the assessment and treatment plan with the patient. The patient was provided an opportunity to ask questions and all were answered. The patient agreed with the plan and demonstrated an understanding of the instructions.   The patient was advised to call back  or seek an in-person evaluation if the symptoms worsen or if the condition fails to improve as anticipated.  I provided 8 minutes of non-face-to-face time during this encounter.   Michel Bickers, MD

## 2019-08-08 ENCOUNTER — Telehealth (HOSPITAL_COMMUNITY): Payer: Self-pay

## 2019-08-08 NOTE — Telephone Encounter (Signed)
Received call from [facility] stating that patient was provided an appt for 11/5 3:30p with the NP.  Pt states she was called and given same information. Verbalized appreciation

## 2019-08-12 ENCOUNTER — Telehealth: Payer: Self-pay | Admitting: *Deleted

## 2019-08-12 ENCOUNTER — Other Ambulatory Visit: Payer: Self-pay | Admitting: *Deleted

## 2019-08-12 ENCOUNTER — Inpatient Hospital Stay (HOSPITAL_BASED_OUTPATIENT_CLINIC_OR_DEPARTMENT_OTHER): Payer: 59 | Admitting: Medical

## 2019-08-12 ENCOUNTER — Other Ambulatory Visit: Payer: Self-pay | Admitting: Medical

## 2019-08-12 ENCOUNTER — Other Ambulatory Visit: Payer: 59

## 2019-08-12 ENCOUNTER — Inpatient Hospital Stay: Payer: 59

## 2019-08-12 ENCOUNTER — Other Ambulatory Visit: Payer: Self-pay

## 2019-08-12 VITALS — BP 124/58 | HR 77 | Temp 98.3°F | Resp 17

## 2019-08-12 DIAGNOSIS — C50311 Malignant neoplasm of lower-inner quadrant of right female breast: Secondary | ICD-10-CM | POA: Diagnosis not present

## 2019-08-12 DIAGNOSIS — M25512 Pain in left shoulder: Secondary | ICD-10-CM | POA: Diagnosis not present

## 2019-08-12 DIAGNOSIS — Z170 Estrogen receptor positive status [ER+]: Secondary | ICD-10-CM

## 2019-08-12 DIAGNOSIS — Z5112 Encounter for antineoplastic immunotherapy: Secondary | ICD-10-CM | POA: Diagnosis not present

## 2019-08-12 DIAGNOSIS — N611 Abscess of the breast and nipple: Secondary | ICD-10-CM

## 2019-08-12 LAB — CMP (CANCER CENTER ONLY)
ALT: 16 U/L (ref 0–44)
AST: 24 U/L (ref 15–41)
Albumin: 3 g/dL — ABNORMAL LOW (ref 3.5–5.0)
Alkaline Phosphatase: 81 U/L (ref 38–126)
Anion gap: 10 (ref 5–15)
BUN: 26 mg/dL — ABNORMAL HIGH (ref 6–20)
CO2: 29 mmol/L (ref 22–32)
Calcium: 9.3 mg/dL (ref 8.9–10.3)
Chloride: 103 mmol/L (ref 98–111)
Creatinine: 1.98 mg/dL — ABNORMAL HIGH (ref 0.44–1.00)
GFR, Est AFR Am: 33 mL/min — ABNORMAL LOW (ref >60)
GFR, Estimated: 29 mL/min — ABNORMAL LOW (ref >60)
Glucose, Bld: 105 mg/dL — ABNORMAL HIGH (ref 70–99)
Potassium: 3.1 mmol/L — ABNORMAL LOW (ref 3.5–5.1)
Sodium: 142 mmol/L (ref 135–145)
Total Bilirubin: 0.2 mg/dL — ABNORMAL LOW (ref 0.3–1.2)
Total Protein: 7.9 g/dL (ref 6.5–8.1)

## 2019-08-12 LAB — CBC WITH DIFFERENTIAL (CANCER CENTER ONLY)
Abs Immature Granulocytes: 0.06 10*3/uL (ref 0.00–0.07)
Basophils Absolute: 0 10*3/uL (ref 0.0–0.1)
Basophils Relative: 1 %
Eosinophils Absolute: 0.2 10*3/uL (ref 0.0–0.5)
Eosinophils Relative: 2 %
HCT: 33.4 % — ABNORMAL LOW (ref 36.0–46.0)
Hemoglobin: 11.4 g/dL — ABNORMAL LOW (ref 12.0–15.0)
Immature Granulocytes: 1 %
Lymphocytes Relative: 23 %
Lymphs Abs: 2 10*3/uL (ref 0.7–4.0)
MCH: 31.1 pg (ref 26.0–34.0)
MCHC: 34.1 g/dL (ref 30.0–36.0)
MCV: 91 fL (ref 80.0–100.0)
Monocytes Absolute: 0.7 10*3/uL (ref 0.1–1.0)
Monocytes Relative: 8 %
Neutro Abs: 5.7 10*3/uL (ref 1.7–7.7)
Neutrophils Relative %: 65 %
Platelet Count: 198 10*3/uL (ref 150–400)
RBC: 3.67 MIL/uL — ABNORMAL LOW (ref 3.87–5.11)
RDW: 16.8 % — ABNORMAL HIGH (ref 11.5–15.5)
WBC Count: 8.6 10*3/uL (ref 4.0–10.5)
nRBC: 0 % (ref 0.0–0.2)

## 2019-08-12 MED ORDER — DIPHENHYDRAMINE HCL 25 MG PO CAPS
ORAL_CAPSULE | ORAL | Status: AC
  Filled 2019-08-12: qty 1

## 2019-08-12 MED ORDER — SODIUM CHLORIDE 0.9 % IV SOLN
360.0000 mg | Freq: Once | INTRAVENOUS | Status: AC
  Administered 2019-08-12: 360 mg via INTRAVENOUS
  Filled 2019-08-12: qty 8

## 2019-08-12 MED ORDER — SODIUM CHLORIDE 0.9 % IV SOLN
Freq: Once | INTRAVENOUS | Status: AC
  Administered 2019-08-12: 15:00:00 via INTRAVENOUS
  Filled 2019-08-12: qty 250

## 2019-08-12 MED ORDER — SODIUM CHLORIDE 0.9% FLUSH
10.0000 mL | INTRAVENOUS | Status: DC | PRN
  Administered 2019-08-12: 10 mL
  Filled 2019-08-12: qty 10

## 2019-08-12 MED ORDER — DIPHENHYDRAMINE HCL 25 MG PO CAPS
25.0000 mg | ORAL_CAPSULE | Freq: Once | ORAL | Status: AC
  Administered 2019-08-12: 25 mg via ORAL

## 2019-08-12 MED ORDER — HEPARIN SOD (PORK) LOCK FLUSH 100 UNIT/ML IV SOLN
500.0000 [IU] | Freq: Once | INTRAVENOUS | Status: AC | PRN
  Administered 2019-08-12: 500 [IU]
  Filled 2019-08-12: qty 5

## 2019-08-12 MED ORDER — ACETAMINOPHEN 325 MG PO TABS
ORAL_TABLET | ORAL | Status: AC
  Filled 2019-08-12: qty 2

## 2019-08-12 MED ORDER — ACETAMINOPHEN 325 MG PO TABS
650.0000 mg | ORAL_TABLET | Freq: Once | ORAL | Status: AC
  Administered 2019-08-12: 650 mg via ORAL

## 2019-08-12 MED ORDER — HYDROCODONE-ACETAMINOPHEN 5-325 MG PO TABS: ORAL_TABLET | ORAL | 0 refills | Status: DC

## 2019-08-12 NOTE — Patient Instructions (Signed)
Ado-Trastuzumab Emtansine for injection What is this medicine? ADO-TRASTUZUMAB EMTANSINE (ADD oh traz TOO zuh mab em TAN zine) is a monoclonal antibody combined with chemotherapy. It is used to treat breast cancer. This medicine may be used for other purposes; ask your health care provider or pharmacist if you have questions. COMMON BRAND NAME(S): Kadcyla What should I tell my health care provider before I take this medicine? They need to know if you have any of these conditions:  heart disease  heart failure  infection (especially a virus infection such as chickenpox, cold sores, or herpes)  liver disease  lung or breathing disease, like asthma  tingling of the fingers or toes, or other nerve disorder  an unusual or allergic reaction to ado-trastuzumab emtansine, other medications, foods, dyes, or preservatives  pregnant or trying to get pregnant  breast-feeding How should I use this medicine? This medicine is for infusion into a vein. It is given by a health care professional in a hospital or clinic setting. Talk to your pediatrician regarding the use of this medicine in children. Special care may be needed. Overdosage: If you think you have taken too much of this medicine contact a poison control center or emergency room at once. NOTE: This medicine is only for you. Do not share this medicine with others. What if I miss a dose? It is important not to miss your dose. Call your doctor or health care professional if you are unable to keep an appointment. What may interact with this medicine? This medicine may also interact with the following medications:  atazanavir  boceprevir  clarithromycin  delavirdine  indinavir  dalfopristin; quinupristin  isoniazid, INH  itraconazole  ketoconazole  nefazodone  nelfinavir  ritonavir  telaprevir  telithromycin  tipranavir  voriconazole This list may not describe all possible interactions. Give your health care  provider a list of all the medicines, herbs, non-prescription drugs, or dietary supplements you use. Also tell them if you smoke, drink alcohol, or use illegal drugs. Some items may interact with your medicine. What should I watch for while using this medicine? Visit your doctor for checks on your progress. This drug may make you feel generally unwell. This is not uncommon, as chemotherapy can affect healthy cells as well as cancer cells. Report any side effects. Continue your course of treatment even though you feel ill unless your doctor tells you to stop. You may need blood work done while you are taking this medicine. Call your doctor or health care professional for advice if you get a fever, chills or sore throat, or other symptoms of a cold or flu. Do not treat yourself. This drug decreases your body's ability to fight infections. Try to avoid being around people who are sick. Be careful brushing and flossing your teeth or using a toothpick because you may get an infection or bleed more easily. If you have any dental work done, tell your dentist you are receiving this medicine. Avoid taking products that contain aspirin, acetaminophen, ibuprofen, naproxen, or ketoprofen unless instructed by your doctor. These medicines may hide a fever. Do not become pregnant while taking this medicine or for 7 months after stopping it, men with female partners should use contraception during treatment and for 4 months after the last dose. Women should inform their doctor if they wish to become pregnant or think they might be pregnant. There is a potential for serious side effects to an unborn child. Do not breast-feed an infant while taking this medicine or   for 7 months after the last dose. Men who have a partner who is pregnant or who is capable of becoming pregnant should use a condom during sexual activity while taking this medicine and for 4 months after stopping it. Men should inform their doctors if they wish to  father a child. This medicine may lower sperm counts. Talk to your health care professional or pharmacist for more information. What side effects may I notice from receiving this medicine? Side effects that you should report to your doctor or health care professional as soon as possible:  allergic reactions like skin rash, itching or hives, swelling of the face, lips, or tongue  breathing problems  chest pain or palpitations  fever or chills, sore throat  general ill feeling or flu-like symptoms  light-colored stools  nausea, vomiting  pain, tingling, numbness in the hands or feet  signs and symptoms of bleeding such as bloody or black, tarry stools; red or dark-brown urine; spitting up blood or brown material that looks like coffee grounds; red spots on the skin; unusual bruising or bleeding from the eye, gums, or nose  swelling of the legs or ankles  yellowing of the eyes or skin Side effects that usually do not require medical attention (report to your doctor or health care professional if they continue or are bothersome):  changes in taste  constipation  dizziness  headache  joint pain  muscle pain  trouble sleeping  unusually weak or tired This list may not describe all possible side effects. Call your doctor for medical advice about side effects. You may report side effects to FDA at 1-800-FDA-1088. Where should I keep my medicine? This drug is given in a hospital or clinic and will not be stored at home. NOTE: This sheet is a summary. It may not cover all possible information. If you have questions about this medicine, talk to your doctor, pharmacist, or health care provider.  2020 Elsevier/Gold Standard (2018-03-02 10:03:15)  Coronavirus (COVID-19) Are you at risk?  Are you at risk for the Coronavirus (COVID-19)?  To be considered HIGH RISK for Coronavirus (COVID-19), you have to meet the following criteria:  . Traveled to [place], [place], [place], [place]  or [place]; or in the [place] to [place], [place], [place], or [place]; and have fever, cough, and shortness of breath within the last 2 weeks of travel OR . Been in close contact with a person diagnosed with COVID-19 within the last 2 weeks and have fever, cough, and shortness of breath . IF YOU DO NOT MEET THESE CRITERIA, YOU ARE CONSIDERED LOW RISK FOR COVID-19.  What to do if you are HIGH RISK for COVID-19?  Marland Kitchen If you are having a medical emergency, call 911. . Seek medical care right away. Before you go to a doctor's office, urgent care or emergency department, call ahead and tell them about your recent travel, contact with someone diagnosed with COVID-19, and your symptoms. You should receive instructions from your physician's office regarding next steps of care.  . When you arrive at healthcare provider, tell the healthcare staff immediately you have returned from visiting [place], [place], [place], [place] or [place]; or traveled in the [place] to [place], [place], [place], or [place]; in the last two weeks or you have been in close contact with a person diagnosed with COVID-19 in the last 2 weeks.   . Tell the health care staff about your symptoms: fever, cough and shortness of  breath. . After you have been seen by a medical provider, you will be either: o Tested for (COVID-19) and discharged home on quarantine except to seek medical care if symptoms worsen, and asked to  - Stay home and avoid contact with others until you get your results (4-5 days)  - Avoid travel on public transportation if possible (such as bus, train, or airplane) or o Sent to the Emergency Department by EMS for evaluation, COVID-19 testing, and possible admission depending on your condition and test results.  What to do if you are LOW RISK for COVID-19?  Reduce your risk of any infection by using the same precautions used for avoiding the common cold or flu:  Marland Kitchen Wash your hands often with  soap and warm water for at least 20 seconds.  If soap and water are not readily available, use an alcohol-based hand sanitizer with at least 60% alcohol.  . If coughing or sneezing, cover your mouth and nose by coughing or sneezing into the elbow areas of your shirt or coat, into a tissue or into your sleeve (not your hands). . Avoid shaking hands with others and consider head nods or verbal greetings only. . Avoid touching your eyes, nose, or mouth with unwashed hands.  . Avoid close contact with people who are sick. . Avoid places or events with large numbers of people in one location, like concerts or sporting events. . Carefully consider travel plans you have or are making. . If you are planning any travel outside or inside the [place], visit the CDC's Travelers' Health webpage for the latest health notices. . If you have some symptoms but not all symptoms, continue to monitor at home and seek medical attention if your symptoms worsen. . If you are having a medical emergency, call 911.   [place] / e-Visit: [email or web address]         [facility]: [facility]: [phone]                   [facility]: [phone]

## 2019-08-12 NOTE — Telephone Encounter (Signed)
Noted creatinine of 1.98 today- per MD review ok to proceed with treatment.

## 2019-08-12 NOTE — Progress Notes (Signed)
Patient c/o left shoulder pain. Seen and assessed by Sandi Mealy PA in infusion. Prescribed pain medication and ordered an Xray for the following day. Patient aware to pick up prescription and to call if she does not hear regarding Xray appt. She declined to stay the 30 min after Kadcyla for observation.

## 2019-08-13 ENCOUNTER — Encounter: Payer: Self-pay | Admitting: General Practice

## 2019-08-13 NOTE — Progress Notes (Signed)
[place] [facility] Note  Referred by Willodean Rosenthal in infusion for spiritual/emotional support after [name] had a rough day yesterday. Phoned to offer follow-up support, including empathic listening, pastoral reflection, encouragement, and introduction to Spiritual Care as part of her support team. We plan for me to f/u in infusion at her next treatment so we can connect in person.   [place], [place], [facility] Pager [phone] Voicemail [phone]

## 2019-08-13 NOTE — Progress Notes (Signed)
[facility] Progress Note   [name] [id number] 07/30/1966 53 y.o.  [name] is managed by Dr. Lurline Del  Actively treated with chemotherapy/immunotherapy/hormonal therapy: yes  Current therapy: Janus Molder  Last treated:  08/12/2019 (cycle 12, day 1)  Next scheduled appointment with provider:  09/02/2019  Assessment: Plan:    Acute pain of left shoulder  Malignant neoplasm of lower-inner quadrant of right breast of female, estrogen receptor positive ([place])   Acute left shoulder pain: An x-ray of the left shoulder was ordered. She was additionally given a refill of hydrocodone.  ER positive malignant neoplasm of the right breast: [name] is followed by Dr. [name] and is receiving cycle 12, day 1 of [place] today. She will be see in follow up on 09/02/2019.  Please see After Visit Summary for patient specific instructions.  Future Appointments  Date Time Provider [place]  09/02/2019 12:15 PM [facility] 3 [facility] None  09/02/2019 12:30 PM [facility] [place] FLUSH [facility] None  09/02/2019  1:00 PM Causey, Charlestine Massed, NP [facility] None  09/02/2019  2:00 PM [facility] INFUSION [facility] None  09/23/2019 12:45 PM [facility] LAB 5 [facility] None  09/23/2019  1:00 PM [facility] [place] FLUSH [facility] None  09/23/2019  2:00 PM [facility] INFUSION [facility] None  10/14/2019 12:45 PM [facility] LAB 5 [facility] None  10/14/2019  1:00 PM [facility] [place] FLUSH [facility] None  10/14/2019  2:00 PM [facility] INFUSION [facility] None  11/04/2019 12:45 PM [facility] LAB 4 [facility] None  11/04/2019  1:00 PM [facility] [place] FLUSH [facility] None  11/04/2019  2:00 PM [facility] INFUSION [facility] None  12/06/2019 11:20 AM [name], NP [facility]    No orders of the defined types were placed in this encounter.      Subjective:   Patient ID:  [name] is a 53 y.o. (DOB 1966/08/18)  female.  Chief Complaint: No chief complaint on file.   HPI [name] Is a 53 y.o. female with a diagnosis of an ER positive malignant neoplasm of the right breast. She is followed by Dr. [name] and is receiving cycle 12, day 1 of [place] today. She was seen in the infusion room today for acute left shoulder pain. She denies trauma or changes in activity. She initially thought that she had pulled a muscle in her sleep.  Medications: I have reviewed the patient's current medications.  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   Diabetes mellitus without complication ([place])    Family history of breast cancer    Family history of lung cancer    Family history of prostate cancer    Gastroenteritis 07/10/07   GERD 07/24/2006   GRAVE'S DISEASE 01/01/2008   History of hidradenitis suppurativa    History of kidney stones    History of thrush    HIV DISEASE 07/24/2006   dx March 05   Hyperlipidemia    HYPERTENSION 07/24/2006   Hyperthyroidism 08/2006   Grave's Disease -diffuse radiotracer uptake 08/25/06 Thyroid scan-Cold nodule to R lower lobe of thyrorid   Menometrorrhagia    hx of  Nephrolithiasis    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 Completed 2 yrs Prednisone after lung bx confirmation. No symptoms since then.    Suppurative hidradenitis    Thyroid cancer ([place])    THYROID NODULE, RIGHT 02/08/2007    Past Surgical History:  Procedure Laterality Date   BREAST EXCISIONAL BIOPSY Right 04/26/2018   right axilla negative   BREAST EXCISIONAL BIOPSY Left 04/26/2018   left axilla negative   BREAST LUMPECTOMY Right 10/03/2018   malignant   BREAST LUMPECTOMY WITH RADIOACTIVE SEED AND SENTINEL LYMPH NODE BIOPSY Right 10/03/2018   Procedure: RIGHT BREAST LUMPECTOMY WITH RADIOACTIVE SEED AND SENTINEL LYMPH NODE MAPPING;  Surgeon: Erroll Luna, MD;  Location: [place];  Service: General;  Laterality: Right;   BREAST SURGERY  1997   Breast Reduction    CYSTOSCOPY W/ URETERAL STENT REMOVAL  11/09/2012   Procedure: CYSTOSCOPY WITH STENT REMOVAL;  Surgeon: Alexis Frock, MD;  Location[facility];  Service: Urology;  Laterality: Right;   CYSTOSCOPY WITH RETROGRADE PYELOGRAM, URETEROSCOPY AND STENT PLACEMENT  11/09/2012   Procedure: CYSTOSCOPY WITH RETROGRADE PYELOGRAM, URETEROSCOPY AND STENT PLACEMENT;  Surgeon: Alexis Frock, MD;  Location[facility];  Service: Urology;  Laterality: Left;  LEFT URETEROSCOPY, STONE MANIPULATION, left STENT exchange    CYSTOSCOPY WITH STENT PLACEMENT  10/02/2012   Procedure: CYSTOSCOPY WITH STENT PLACEMENT;  Surgeon: Alexis Frock, MD;  Location: [facility];  Service: Urology;  Laterality: Left;   DILATION AND CURETTAGE OF UTERUS  Feb 2004   s/p for 1st trimester nonviable pregnancy   EYE SURGERY     sty under eyelid   INCISE AND DRAIN ABCESS  Nov 03   s/p I &D for righ inframmary fold hidradenitis   INCISION AND DRAINAGE PERITONSILLAR ABSCESS  Mar 03   IR CV LINE INJECTION  06/07/2018   IR IMAGING GUIDED PORT INSERTION  06/20/2018   IR REMOVAL TUN ACCESS W/ PORT W/O FL MOD SED  06/20/2018   IRRIGATION AND DEBRIDEMENT ABSCESS  01/31/2012   Procedure: IRRIGATION AND DEBRIDEMENT ABSCESS;  Surgeon: Shann Medal, MD;  Location[facility];  Service: General;  Laterality: Right;   right breast and axilla    NEPHROLITHOTOMY  10/02/2012   Procedure: NEPHROLITHOTOMY PERCUTANEOUS;  Surgeon: Alexis Frock, MD;  Location[facility];  Service: Urology;  Laterality: Right;  First Stage Percutaneous Nephrolithotomy with Surgeon Access, Left Ureteral Stent     NEPHROLITHOTOMY  10/04/2012   Procedure: NEPHROLITHOTOMY PERCUTANEOUS SECOND LOOK;  Surgeon: Alexis Frock, MD;  [facility];  Service: Urology;  Laterality: Right;      NEPHROLITHOTOMY  10/08/2012   Procedure: NEPHROLITHOTOMY PERCUTANEOUS;  Surgeon: Alexis Frock, MD;  Location[facility];  Service: Urology;  Laterality: Right;  THIRD STAGE, nephrostomy tube exchange x 2   NEPHROLITHOTOMY  10/11/2012   Procedure: NEPHROLITHOTOMY PERCUTANEOUS SECOND LOOK;  Surgeon: Alexis Frock, MD;  [facility];  Service: Urology;  Laterality: Right;  RIGHT 4 STAGE PERCUTANOUS NEPHROLITHOTOMY, right URETEROSCOPY WITH HOLMIUM LASER    PORTACATH PLACEMENT Left 05/17/2018   Procedure: INSERTION PORT-A-CATH;  Surgeon: Coralie Keens, MD;  Location: [facility];  Service: General;  Laterality: Left;   RADICAL NECK DISSECTION  04/12/2017  limited/notes 04/12/2017   RADICAL NECK DISSECTION N/A 04/12/2017   Procedure: RADICAL NECK DISSECTION;  Surgeon: Melida Quitter, MD;  Location: [facility];  Service: ENT;  Laterality: N/A;  limited neck dissection 2 hours total   REDUCTION MAMMAPLASTY Bilateral [place]  04/12/2017   completion/notes 04/12/2017   THYROIDECTOMY N/A 04/12/2017   Procedure: THYROIDECTOMY;  Surgeon: Melida Quitter, MD;  Location: [name];  Service: ENT;  Laterality: N/A;  Completion Thyroidectomy   TOTAL THYROIDECTOMY  2010    Family History  Problem Relation Age of Onset   Hypertension Mother    Cancer Mother        laryngeal   Heart disease Mother        stent   Hypertension Father    Lung cancer Father 9       hx smoking   Heart disease Other     Hypertension Other    Stroke Other        Grandparent   Kidney disease Other        Grandparent   Diabetes Other        FH of Diabetes   Hypertension Sister    Cancer Maternal Uncle        Lung CA   Hypertension Brother    Hypertension Sister    Breast cancer Maternal Aunt 65   Breast cancer Paternal Aunt 14   Prostate cancer Paternal Uncle    Breast cancer Maternal Aunt        dx 60+   Breast cancer Paternal Aunt        dx 68's   Breast cancer Paternal Aunt        dx 50's   Prostate cancer Paternal Uncle    Lung cancer Paternal Uncle    Breast cancer Cousin 63   Breast cancer Cousin        dx <50   Breast cancer Cousin        dx <50   Breast cancer Cousin        dx <50   Colon cancer Neg Hx    Esophageal cancer Neg Hx    Rectal cancer Neg Hx    Stomach cancer Neg Hx     Social History   Socioeconomic History   Marital status: Single    Spouse name: Not on file   Number of children: 1   Years of education: Not on file   Highest education level: Not on file  Occupational History   Occupation: Secondary school teacher  Social Needs   Financial resource strain: Not on file   Food insecurity    Worry: Not on file    Inability: Not on file   Transportation needs    Medical: No    Non-medical: No  Tobacco Use   Smoking status: Former Smoker    Packs/day: 0.50    Years: 15.00    Pack years: 7.50    Types: Cigarettes    Start date: 04/12/2017   Smokeless tobacco: Never Used   Tobacco comment: quit smoking  may 2018  Substance and Sexual Activity   Alcohol use: Yes    Alcohol/week: 0.0 standard drinks    Comment: social   Drug use: No   Sexual activity: Not Currently    Birth control/protection: Post-menopausal    Comment: declined condoms  Lifestyle   Physical activity    Days per week: Not on file    Minutes per session: Not on  file   Stress: Not on file  Relationships   Social connections    Talks on phone: Not on  file    Gets together: Not on file    Attends religious service: Not on file    Active member of club or organization: Not on file    Attends meetings of clubs or organizations: Not on file    Relationship status: Not on file   Intimate partner violence    Fear of current or ex partner: No    Emotionally abused: No    Physically abused: No    Forced sexual activity: No  Other Topics Concern   Not on file  Social History Narrative   Not on file    Past Medical History, Surgical history, Social history, and Family history were reviewed and updated as appropriate.   Please see review of systems for further details on the patient's review from today.   Review of Systems:  Review of Systems  Constitutional: Negative for chills, diaphoresis and fever.  HENT: Negative for trouble swallowing and voice change.   Respiratory: Negative for cough, chest tightness, shortness of breath and wheezing.   Cardiovascular: Negative for chest pain and palpitations.  Gastrointestinal: Negative for abdominal pain, constipation, diarrhea, nausea and vomiting.  Musculoskeletal: Positive for arthralgias. Negative for back pain and myalgias.  Neurological: Negative for dizziness, light-headedness and headaches.    Objective:   Physical Exam:  LMP 03/31/2014 Comment: Negative Serum HCG 06/07/17 ECOG: 0  Physical Exam Constitutional:      General: She is not in acute distress.    Appearance: Normal appearance. She is not ill-appearing, toxic-appearing or diaphoretic.  HENT:     Head: Normocephalic and atraumatic.  Eyes:     General: No scleral icterus.       Right eye: No discharge.        Left eye: No discharge.     Conjunctiva/sclera: Conjunctivae normal.  Musculoskeletal: Normal range of motion.     Left shoulder: She exhibits no tenderness, no bony tenderness, no swelling, no effusion, no crepitus, no deformity, no pain, no spasm and normal pulse.  Skin:    General: Skin is warm and dry.   Neurological:     Mental Status: She is alert.  Psychiatric:        Mood and Affect: Mood normal.        Behavior: Behavior normal.        Thought Content: Thought content normal.        Judgment: Judgment normal.     Lab Review:     Component Value Date/Time   NA 142 08/12/2019 1447   K 3.1 (L) 08/12/2019 1447   CL 103 08/12/2019 1447   CO2 29 08/12/2019 1447   GLUCOSE 105 (H) 08/12/2019 1447   BUN 26 (H) 08/12/2019 1447   CREATININE 1.98 (H) 08/12/2019 1447   CREATININE 1.02 09/23/2015 1047   CALCIUM 9.3 08/12/2019 1447   PROT 7.9 08/12/2019 1447   ALBUMIN 3.0 (L) 08/12/2019 1447   AST 24 08/12/2019 1447   ALT 16 08/12/2019 1447   ALKPHOS 81 08/12/2019 1447   BILITOT 0.2 (L) 08/12/2019 1447   GFRNONAA 29 (L) 08/12/2019 1447   GFRAA 33 (L) 08/12/2019 1447       Component Value Date/Time   WBC 8.6 08/12/2019 1447   WBC 8.9 07/22/2019 1014   RBC 3.67 (L) 08/12/2019 1447   HGB 11.4 (L) 08/12/2019 1447   HCT 33.4 (L) 08/12/2019 1447  PLT 198 08/12/2019 1447   MCV 91.0 08/12/2019 1447   MCH 31.1 08/12/2019 1447   MCHC 34.1 08/12/2019 1447   RDW 16.8 (H) 08/12/2019 1447   LYMPHSABS 2.0 08/12/2019 1447   MONOABS 0.7 08/12/2019 1447   EOSABS 0.2 08/12/2019 1447   BASOSABS 0.0 08/12/2019 1447   -------------------------------  Imaging from last 24 hours (if applicable):  Radiology interpretation: US Guided Needle Placement  Result Date: 07/14/2019 INDICATION: Right retro-areolar breast abscess. EXAM: ULTRASOUND-GUIDED ASPIRATION DRAINAGE OF RIGHT BREAST ABSCESS MEDICATIONS: The patient is currently admitted to the hospital and receiving intravenous antibiotics. The antibiotics were administered within an appropriate time frame prior to the initiation of the procedure. ANESTHESIA/SEDATION: Fentanyl 2.0 mcg IV; Versed 100 mg IV Moderate Sedation Time:  16 minutes. The patient was continuously monitored during the procedure by the interventional radiology nurse under  my direct supervision. COMPLICATIONS: None immediate. PROCEDURE: Informed written consent was obtained from the patient after a thorough discussion of the procedural risks, benefits and alternatives. All questions were addressed. Maximal Sterile Barrier Technique was utilized including caps, mask, sterile gowns, sterile gloves, sterile drape, hand hygiene and skin antiseptic. A timeout was performed prior to the initiation of the procedure. Ultrasound was performed of the right breast. Overlying skin was prepped with chlorhexidine. Local anesthesia was provided with 1% lidocaine. Under direct ultrasound guidance, a 5 French centesis catheter was advanced over a coaxial 19 gauge needle into a complex fluid collection/abscess. Aspiration was performed. A sample of fluid was sent for culture analysis. Additional saline lavage was performed in the cavity with a total of 10 mL of sterile saline. Injected sterile saline was completely aspirated. The centesis catheter was removed. FINDINGS: Complex retro-areolar collection measures approximately 4.6 x 1.5 x 3.7 cm by ultrasound. Aspiration yielded grossly purulent fluid. After aspiration of approximately 8 mL of purulent fluid, there was no longer any free return of fluid. Additional saline lavage was performed in the abscess cavity with sterile saline. Postprocedural ultrasound demonstrates significant improvement with decompression of the abscess and some residual soft tissue thickening and small amount of septated fluid remaining. IMPRESSION: Ultrasound-guided aspiration of right retro-areolar breast abscess yielding grossly purulent fluid. Approximately 8 mL of purulent fluid was aspirated and sent for culture analysis. Additional sterile saline lavage was performed in a complex and septated abscess cavity. Electronically Signed   By: Aletta Edouard M.D.   On: 07/14/2019 15:33

## 2019-08-15 ENCOUNTER — Telehealth: Payer: Self-pay | Admitting: General Practice

## 2019-08-15 NOTE — Telephone Encounter (Signed)
[facility] CSW Progress Notes  Call to patient to discuss progress towards completing applications for financial assistance including [employer] in [place] and [employer].  Patient will work on them and notify CSW when ready to submit.  Edwyna Shell, LCSW Clinical Social Worker Phone:  [phone]

## 2019-08-20 ENCOUNTER — Encounter: Payer: Self-pay | Admitting: *Deleted

## 2019-08-20 ENCOUNTER — Ambulatory Visit (HOSPITAL_COMMUNITY)
Admission: RE | Admit: 2019-08-20 | Discharge: 2019-08-20 | Disposition: A | Discharge status: Home / Self Care | Payer: 59 | Source: Ambulatory Visit | Attending: Medical | Admitting: Medical

## 2019-08-20 ENCOUNTER — Other Ambulatory Visit: Payer: Self-pay

## 2019-08-20 DIAGNOSIS — M25512 Pain in left shoulder: Secondary | ICD-10-CM | POA: Diagnosis not present

## 2019-08-27 ENCOUNTER — Other Ambulatory Visit: Payer: Self-pay | Admitting: Nephrology

## 2019-08-27 ENCOUNTER — Other Ambulatory Visit: Payer: Self-pay | Admitting: Primary Care

## 2019-08-27 DIAGNOSIS — E119 Type 2 diabetes mellitus without complications: Secondary | ICD-10-CM

## 2019-08-27 DIAGNOSIS — J4530 Mild persistent asthma, uncomplicated: Secondary | ICD-10-CM

## 2019-08-27 DIAGNOSIS — N183 Chronic kidney disease, stage 3 unspecified: Secondary | ICD-10-CM

## 2019-08-27 NOTE — Telephone Encounter (Signed)
confirmed that patient is taking Glipizide twice daily.

## 2019-08-27 NOTE — Progress Notes (Signed)
These results were called to [facility]. A message was left for her. She was told to call if she would like to be referred to orthopedics. Sandi Mealy, MHS, PA-C

## 2019-08-28 ENCOUNTER — Other Ambulatory Visit: Payer: Self-pay | Admitting: Adult Health

## 2019-08-28 DIAGNOSIS — C50311 Malignant neoplasm of lower-inner quadrant of right female breast: Secondary | ICD-10-CM

## 2019-08-28 DIAGNOSIS — Z170 Estrogen receptor positive status [ER+]: Secondary | ICD-10-CM

## 2019-09-01 ENCOUNTER — Other Ambulatory Visit: Payer: Self-pay | Admitting: Primary Care

## 2019-09-01 DIAGNOSIS — E785 Hyperlipidemia, unspecified: Secondary | ICD-10-CM

## 2019-09-02 ENCOUNTER — Inpatient Hospital Stay: Payer: 59 | Attending: Oncology | Admitting: Adult Health

## 2019-09-02 ENCOUNTER — Inpatient Hospital Stay: Payer: 59

## 2019-09-02 ENCOUNTER — Encounter: Payer: Self-pay | Admitting: General Practice

## 2019-09-02 ENCOUNTER — Other Ambulatory Visit: Payer: Self-pay

## 2019-09-02 ENCOUNTER — Encounter: Payer: Self-pay | Admitting: Adult Health

## 2019-09-02 VITALS — BP 139/67 | HR 84 | Temp 98.9°F | Resp 18 | Ht 65.0 in | Wt 235.5 lb

## 2019-09-02 DIAGNOSIS — C50311 Malignant neoplasm of lower-inner quadrant of right female breast: Secondary | ICD-10-CM

## 2019-09-02 DIAGNOSIS — Z5112 Encounter for antineoplastic immunotherapy: Secondary | ICD-10-CM | POA: Insufficient documentation

## 2019-09-02 DIAGNOSIS — C50211 Malignant neoplasm of upper-inner quadrant of right female breast: Secondary | ICD-10-CM | POA: Diagnosis present

## 2019-09-02 DIAGNOSIS — Z170 Estrogen receptor positive status [ER+]: Secondary | ICD-10-CM

## 2019-09-02 DIAGNOSIS — C73 Malignant neoplasm of thyroid gland: Secondary | ICD-10-CM

## 2019-09-02 DIAGNOSIS — Z95828 Presence of other vascular implants and grafts: Secondary | ICD-10-CM

## 2019-09-02 LAB — CBC WITH DIFFERENTIAL/PLATELET
Abs Immature Granulocytes: 0.03 10*3/uL (ref 0.00–0.07)
Basophils Absolute: 0 10*3/uL (ref 0.0–0.1)
Basophils Relative: 1 %
Eosinophils Absolute: 0.1 10*3/uL (ref 0.0–0.5)
Eosinophils Relative: 2 %
HCT: 34.3 % — ABNORMAL LOW (ref 36.0–46.0)
Hemoglobin: 11.4 g/dL — ABNORMAL LOW (ref 12.0–15.0)
Immature Granulocytes: 0 %
Lymphocytes Relative: 26 %
Lymphs Abs: 1.9 10*3/uL (ref 0.7–4.0)
MCH: 31 pg (ref 26.0–34.0)
MCHC: 33.2 g/dL (ref 30.0–36.0)
MCV: 93.2 fL (ref 80.0–100.0)
Monocytes Absolute: 0.5 10*3/uL (ref 0.1–1.0)
Monocytes Relative: 7 %
Neutro Abs: 4.8 10*3/uL (ref 1.7–7.7)
Neutrophils Relative %: 64 %
Platelets: 174 10*3/uL (ref 150–400)
RBC: 3.68 MIL/uL — ABNORMAL LOW (ref 3.87–5.11)
RDW: 16.6 % — ABNORMAL HIGH (ref 11.5–15.5)
WBC: 7.4 10*3/uL (ref 4.0–10.5)
nRBC: 0 % (ref 0.0–0.2)

## 2019-09-02 LAB — COMPREHENSIVE METABOLIC PANEL
ALT: 27 U/L (ref 0–44)
AST: 42 U/L — ABNORMAL HIGH (ref 15–41)
Albumin: 3.1 g/dL — ABNORMAL LOW (ref 3.5–5.0)
Alkaline Phosphatase: 85 U/L (ref 38–126)
Anion gap: 12 (ref 5–15)
BUN: 26 mg/dL — ABNORMAL HIGH (ref 6–20)
CO2: 29 mmol/L (ref 22–32)
Calcium: 8.6 mg/dL — ABNORMAL LOW (ref 8.9–10.3)
Chloride: 100 mmol/L (ref 98–111)
Creatinine, Ser: 1.81 mg/dL — ABNORMAL HIGH (ref 0.44–1.00)
GFR calc Af Amer: 37 mL/min — ABNORMAL LOW (ref >60)
GFR calc non Af Amer: 32 mL/min — ABNORMAL LOW (ref >60)
Glucose, Bld: 182 mg/dL — ABNORMAL HIGH (ref 70–99)
Potassium: 3.2 mmol/L — ABNORMAL LOW (ref 3.5–5.1)
Sodium: 141 mmol/L (ref 135–145)
Total Bilirubin: <0.2 mg/dL — ABNORMAL LOW (ref 0.3–1.2)
Total Protein: 8.2 g/dL — ABNORMAL HIGH (ref 6.5–8.1)

## 2019-09-02 MED ORDER — ACETAMINOPHEN 325 MG PO TABS
ORAL_TABLET | ORAL | Status: AC
  Filled 2019-09-02: qty 2

## 2019-09-02 MED ORDER — DIPHENHYDRAMINE HCL 25 MG PO CAPS
ORAL_CAPSULE | ORAL | Status: AC
  Filled 2019-09-02: qty 1

## 2019-09-02 MED ORDER — HEPARIN SOD (PORK) LOCK FLUSH 100 UNIT/ML IV SOLN
500.0000 [IU] | Freq: Once | INTRAVENOUS | Status: AC | PRN
  Administered 2019-09-02: 500 [IU]
  Filled 2019-09-02: qty 5

## 2019-09-02 MED ORDER — MELOXICAM 7.5 MG PO TABS: 7.5000 mg | ORAL_TABLET | Freq: Every day | ORAL | 0 refills | Status: DC

## 2019-09-02 MED ORDER — TAMOXIFEN CITRATE 20 MG PO TABS: 20.0000 mg | ORAL_TABLET | Freq: Every day | ORAL | 1 refills | Status: DC

## 2019-09-02 MED ORDER — ACETAMINOPHEN 325 MG PO TABS
650.0000 mg | ORAL_TABLET | Freq: Once | ORAL | Status: AC
  Administered 2019-09-02: 15:00:00 650 mg via ORAL

## 2019-09-02 MED ORDER — DIPHENHYDRAMINE HCL 25 MG PO CAPS
25.0000 mg | ORAL_CAPSULE | Freq: Once | ORAL | Status: AC
  Administered 2019-09-02: 25 mg via ORAL

## 2019-09-02 MED ORDER — SODIUM CHLORIDE 0.9 % IV SOLN
Freq: Once | INTRAVENOUS | Status: AC
  Administered 2019-09-02: 16:00:00 via INTRAVENOUS
  Filled 2019-09-02: qty 250

## 2019-09-02 MED ORDER — SODIUM CHLORIDE 0.9 % IV SOLN
360.0000 mg | Freq: Once | INTRAVENOUS | Status: AC
  Administered 2019-09-02: 16:00:00 360 mg via INTRAVENOUS
  Filled 2019-09-02: qty 8

## 2019-09-02 MED ORDER — SODIUM CHLORIDE 0.9% FLUSH
10.0000 mL | INTRAVENOUS | Status: DC | PRN
  Administered 2019-09-02: 10 mL
  Filled 2019-09-02: qty 10

## 2019-09-02 MED ORDER — SODIUM CHLORIDE 0.9% FLUSH
10.0000 mL | INTRAVENOUS | Status: DC | PRN
  Administered 2019-09-02: 13:00:00 10 mL
  Filled 2019-09-02: qty 10

## 2019-09-02 NOTE — Progress Notes (Signed)
[facility]  [phone] [phone]) [phone]    ID: Trenika Hudson DOB: 09/13/1966  MR#: [id number]  ZDG#:[id number]  Patient Care Team: [name], NP as PCP - General (Internal Medicine) Michel Bickers, MD as PCP - Infectious Diseases (Infectious Diseases) Magrinat, Virgie Dad, MD as Consulting Physician (Oncology) Kyung Rudd, MD as Consulting Physician (Radiation Oncology) OTHER MD:   CHIEF COMPLAINT: Triple positive breast cancer  CURRENT TREATMENT: Adjuvant T-DM1; tamoxifen   INTERVAL HISTORY: Nohemy returns today for follow-up and treatment of her triple positive breast cancer. She was last seen here on 07/02/2019.   She continues on adjuvant TDM1.  She says this makes her tired but otherwise she tolerates it well.Kortnee's last echocardiogram on 06/20/2019, showed an ejection fraction in the 55% - 60% range. She saw Dr. Haroldine Laws in October and was doing well, she was referred to Dr.   She also continues on tamoxifen. She notes that her pharmacy has been out of the tamoxifen and she hasn't taken it for a couple of weeks.   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  She has not seen ortho about this.  She is having difficulty with shoulder range of motion.    REVIEW OF SYSTEMS: Evadne is feeling moderately well.  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  She continues to struggle with fatigue.  She is following the appropriate pandemic precautions.  She denies any new issues such as fever, chills, chest pain, palpitations, cough, shortness of breath, nausea, vomiting, bowel/bladder changes, or any other issues.  Her breast abscess has completely healed.  A detailed ROS was otherwise non contributory.     HISTORY OF CURRENT ILLNESS: From the original intake note:  Caysie Cornelia Rathje (pronounced "Huff") palpated a mass in the right breast for about 2  weeks before bringing it to medical attention. She underwent bilateral diagnostic mammography with tomography and right breast ultrasonography at [facility] on 04/20/2018 showing: breast density category B. There was a highly suspicious mass located at 3 o'clock in the upper inner quadrant measuring 2.9 x 2.5 x 1.6 cm and 4 cm from the nipple. Ultrasound of the right axilla showed 5 lymph nodes with abnormal cortical thickening. The left axilla showed 3 lymph nodes with abnormal cortical thickening.  Accordingly on 04/26/2018 she proceeded to biopsy of the right breast area in question. The pathology from this procedure showed ([id number]): Invasive ductal carcinoma, grade II with calcifications. One right axillary lymph node and one left axillary lymph node were negative for carcinoma. Prognostic indicators significant for: estrogen receptor, 100% positive and progesterone receptor, 80% positive, both with strong staining intensity. Proliferation marker Ki67 at 70%. HER2 amplified with ratios HER2/CEP17 signals 4.52 and average HER2 copies per cell 10.85  The patient's subsequent history is as detailed below.  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   Diabetes mellitus without complication ([place])    Family history of breast cancer    Family history of lung cancer    Family history of prostate cancer    Gastroenteritis 07/10/07   GERD 07/24/2006   GRAVE'S DISEASE 01/01/2008   History of hidradenitis suppurativa    History of  kidney stones    History of thrush    HIV DISEASE 07/24/2006   dx March 05   Hyperlipidemia    HYPERTENSION 07/24/2006   Hyperthyroidism 08/2006   Grave's Disease -diffuse radiotracer uptake 08/25/06 Thyroid scan-Cold nodule to R lower lobe of thyrorid   Menometrorrhagia    hx of    Nephrolithiasis    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 Completed 2 yrs Prednisone after lung bx confirmation. No symptoms since then.   Suppurative hidradenitis    Thyroid cancer ([place])    THYROID NODULE, RIGHT 02/08/2007  -2018 thyroid nodules were precancerous but pathology was negative for thyroid cancer.   PAST SURGICAL HISTORY: Past Surgical History:  Procedure Laterality Date   BREAST EXCISIONAL BIOPSY Right 04/26/2018   right axilla negative   BREAST EXCISIONAL BIOPSY Left 04/26/2018   left axilla negative   BREAST LUMPECTOMY Right 10/03/2018   malignant   BREAST LUMPECTOMY WITH RADIOACTIVE SEED AND SENTINEL LYMPH NODE BIOPSY Right 10/03/2018   Procedure: RIGHT BREAST LUMPECTOMY WITH RADIOACTIVE SEED AND SENTINEL LYMPH NODE MAPPING;  Surgeon: Erroll Luna, MD;  Location: [facility];  Service: General;  Laterality: Right;   BREAST SURGERY  1997   Breast Reduction    CYSTOSCOPY W/ URETERAL STENT REMOVAL  11/09/2012   Procedure: CYSTOSCOPY WITH STENT REMOVAL;  Surgeon: Alexis Frock, MD;  Location: [facility];  Service: Urology;  Laterality: Right;   CYSTOSCOPY WITH RETROGRADE PYELOGRAM, URETEROSCOPY AND STENT PLACEMENT  11/09/2012   Procedure: CYSTOSCOPY WITH RETROGRADE PYELOGRAM, URETEROSCOPY AND STENT PLACEMENT;  Surgeon: Alexis Frock, MD;  Location[facility];  Service: Urology;  Laterality: Left;  LEFT URETEROSCOPY, STONE MANIPULATION, left STENT exchange    CYSTOSCOPY WITH STENT PLACEMENT  10/02/2012   Procedure: CYSTOSCOPY WITH STENT PLACEMENT;  Surgeon: Alexis Frock, MD;  Location[facility];  Service: Urology;  Laterality: Left;   DILATION AND CURETTAGE OF UTERUS  Feb 2004   s/p for  1st trimester nonviable pregnancy   EYE SURGERY     sty under eyelid   INCISE AND DRAIN ABCESS  Nov 03   s/p I &D for righ inframmary fold hidradenitis   INCISION AND DRAINAGE PERITONSILLAR ABSCESS  Mar 03   IR CV LINE INJECTION  06/07/2018   IR IMAGING GUIDED PORT INSERTION  06/20/2018   IR REMOVAL TUN ACCESS W/ PORT W/O FL MOD SED  06/20/2018   IRRIGATION AND DEBRIDEMENT ABSCESS  01/31/2012   Procedure: IRRIGATION AND DEBRIDEMENT ABSCESS;  Surgeon: Shann Medal, MD;  Location[facility];  Service: General;  Laterality: Right;  right breast and axilla    NEPHROLITHOTOMY  10/02/2012   Procedure: NEPHROLITHOTOMY PERCUTANEOUS;  Surgeon: Alexis Frock, MD;  [facility];  Service: Urology;  Laterality: Right;  First Stage Percutaneous Nephrolithotomy with Surgeon Access, Left Ureteral Stent     NEPHROLITHOTOMY  10/04/2012   Procedure: NEPHROLITHOTOMY PERCUTANEOUS SECOND LOOK;  Surgeon: Alexis Frock, MD;  [facility];  Service: Urology;  Laterality: Right;      NEPHROLITHOTOMY  10/08/2012   Procedure: NEPHROLITHOTOMY PERCUTANEOUS;  Surgeon: Alexis Frock, MD;  [facility];  Service: Urology;  Laterality: Right;  THIRD STAGE, nephrostomy tube exchange x 2  NEPHROLITHOTOMY  10/11/2012   Procedure: NEPHROLITHOTOMY PERCUTANEOUS SECOND LOOK;  Surgeon: Alexis Frock, MD;  [facility];  Service: Urology;  Laterality: Right;  RIGHT 4 STAGE PERCUTANOUS NEPHROLITHOTOMY, right URETEROSCOPY WITH HOLMIUM LASER    PORTACATH PLACEMENT Left 05/17/2018   Procedure: INSERTION PORT-A-CATH;  Surgeon: Coralie Keens, MD;  Location: [facility];  Service: General;  Laterality: Left;   RADICAL NECK DISSECTION  04/12/2017   limited/notes 04/12/2017   RADICAL NECK DISSECTION N/A 04/12/2017   Procedure: RADICAL NECK DISSECTION;  Surgeon: Melida Quitter, MD;  Location: [facility];  Service: ENT;  Laterality: N/A;  limited neck dissection 2 hours total   REDUCTION MAMMAPLASTY  Bilateral [place]  04/12/2017   completion/notes 04/12/2017   THYROIDECTOMY N/A 04/12/2017   Procedure: THYROIDECTOMY;  Surgeon: Melida Quitter, MD;  Location: [facility];  Service: ENT;  Laterality: N/A;  Completion Thyroidectomy   TOTAL THYROIDECTOMY  2010  Thyroid nodules removed- pathology at the ENT doctor 2018 were benign -Over active thyroid and thyroidectomy with benign pathology  FAMILY HISTORY Family History  Problem Relation Age of Onset   Hypertension Mother    Cancer Mother        laryngeal   Heart disease Mother        stent   Hypertension Father    Lung cancer Father 66       hx smoking   Heart disease Other    Hypertension Other    Stroke Other        Grandparent   Kidney disease Other        Grandparent   Diabetes Other        FH of Diabetes   Hypertension Sister    Cancer Maternal Uncle        Lung CA   Hypertension Brother    Hypertension Sister    Breast cancer Maternal Aunt 65   Breast cancer Paternal Aunt 107   Prostate cancer Paternal Uncle    Breast cancer Maternal Aunt        dx 60+   Breast cancer Paternal Aunt        dx 70's   Breast cancer Paternal Aunt        dx 50's   Prostate cancer Paternal Uncle    Lung cancer Paternal Uncle    Breast cancer Cousin 30   Breast cancer Cousin        dx <50   Breast cancer Cousin        dx <50   Breast cancer Cousin        dx <50   Colon cancer Neg Hx    Esophageal cancer Neg Hx    Rectal cancer Neg Hx    Stomach cancer Neg Hx   The patient's mother is alive at age 74. The patient's father died at 77 from lung cancer (heavy smoker). She does not know much about her father. The patient has 1 brother and 2 sisters. The patient's mother had a history of laryngeal cancer. There was a maternal cousin with breast cancer diagnosed at age 83. There were 2 maternal aunts with breast cancer, the youngest being diagnosed in the 13's. There was a maternal  great aunt with breast cancer. There were 2 paternal aunts with breast cancer.    GYNECOLOGIC HISTORY:  Patient's last menstrual period was 03/31/2014. Menarche: 53 years old Age at first live birth: 53 years old She is GX P1.  Her  LMP was in 2017. She did not use HRT.    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 At home is the patient's daughter, Verdie Drown, age 24, who is a Engineer, technical sales education and working in Therapist, art.  The patient attends [employer].   ADVANCED DIRECTIVES: Not in place; at the 05/10/2018 visit the patient was given the appropriate documents to complete and notarized at her discretion   HEALTH MAINTENANCE: Social History   Tobacco Use   Smoking status: Former Smoker    Packs/day: 0.50    Years: 15.00    Pack years: 7.50    Types: Cigarettes    Start date: 04/12/2017   Smokeless tobacco: Never Used   Tobacco comment: quit smoking  may 2018  Substance Use Topics   Alcohol use: Yes    Alcohol/week: 0.0 standard drinks    Comment: social   Drug use: No     Colonoscopy: no  PAP: [facility]. 2018  Bone density: remote   Allergies  Allergen Reactions   Genvoya [Elviteg-Cobic-Emtricit-Tenofaf] Hives   Lisinopril Cough   Valsartan Other (See Comments) and Cough    Pt states she tolerates medicine now    Current Outpatient Medications  Medication Sig Dispense Refill   amLODipine (NORVASC) 10 MG tablet TAKE 1 TABLET BY MOUTH EVERY DAY FOR BLOOD PRESSURE (Patient taking differently: Take 10 mg by mouth daily. ) 90 tablet 1   Blood Glucose Monitoring Suppl (ONETOUCH VERIO) w/Device KIT USE AS INSTRUCTED TO TEST BLOOD SUGAR UP TO 4 TIMES DAILY E11.9 1 kit 0   dapagliflozin propanediol (FARXIGA) 10 MG TABS tablet Take 10 mg by mouth daily before breakfast. 30 tablet 6   emtricitabine-tenofovir AF (DESCOVY) 200-25 MG tablet Take 1 tablet by mouth daily. 30 tablet 2   FLOVENT HFA  110 MCG/ACT inhaler TAKE 1 PUFF BY MOUTH TWICE A DAY 36 Inhaler 1   fluconazole (DIFLUCAN) 100 MG tablet Take 1 tablet (100 mg total) by mouth daily. 20 tablet 0   glipiZIDE (GLUCOTROL) 5 MG tablet TAKE 1 TABLET (5 MG TOTAL) BY MOUTH 2 (TWO) TIMES DAILY BEFORE A MEAL. FOR DIABETES. 180 tablet 1   glucose blood (ONETOUCH VERIO) test strip USE AS INSTRUCTED TO TEST BLOOD SUGAR DAILY 100 each 2   HYDROcodone-acetaminophen (NORCO/VICODIN) 5-325 MG tablet 1/2 to 1 PO Q 6 hours prn pain 15 tablet 0   irbesartan-hydrochlorothiazide (AVALIDE) 150-12.5 MG tablet TAKE 1 TABLET BY MOUTH ONCE DAILY FOR BLOOD PRESSURE. (Patient taking differently: Take 1 tablet by mouth daily. ) 90 tablet 1   Lancets (ONETOUCH ULTRASOFT) lancets Use as instructed to test blood sugar daily 100 each 5   levothyroxine (SYNTHROID) 175 MCG tablet TAKE 1 TABLET BY MOUTH EVERY MORNING ON AN EMPTY STOMACH WITH A FULL GLASS OF WATER. (Patient taking differently: Take 175 mcg by mouth daily before breakfast. TAKE  ON AN EMPTY STOMACH WITH A FULL GLASS OF WATER.) 90 tablet 0   magic mouthwash SOLN Take 5 mLs by mouth 3 (three) times daily as needed for mouth pain. 150 mL 2   metFORMIN (GLUCOPHAGE) 500 MG tablet      metoprolol succinate (TOPROL-XL) 50 MG 24 hr tablet TAKE 1 TABLET (50 MG TOTAL) BY MOUTH DAILY. TAKE WITH OR IMMEDIATELY FOLLOWING A MEAL. 90 tablet 0   nystatin (MYCOSTATIN) 100000 UNIT/ML suspension      omeprazole (PRILOSEC) 40 MG capsule Take 1 capsule (40 mg total) by mouth daily.  60 capsule 1   ondansetron (ZOFRAN) 8 MG tablet Take 1 tablet (8 mg total) by mouth every 8 (eight) hours as needed for nausea or vomiting. (Patient not taking: Reported on 07/30/2019) 40 tablet 2   rosuvastatin (CRESTOR) 10 MG tablet Take 1 tablet (10 mg total) by mouth daily. For cholesterol. 90 tablet 0   tamoxifen (NOLVADEX) 20 MG tablet Take 1 tablet (20 mg total) by mouth daily. 90 tablet 1   [name] 50 MG tablet TAKE 1  TABLET BY MOUTH EVERY DAY (Patient taking differently: Take 50 mg by mouth daily. ) 30 tablet 2   No current facility-administered medications for this visit.    Facility-Administered Medications Ordered in Other Visits  Medication Dose Route Frequency Provider Last Rate Last Dose   sodium chloride flush (NS) 0.9 % injection 10 mL  10 mL Intracatheter PRN Magrinat, Virgie Dad, MD   10 mL at 09/19/18 1551    OBJECTIVE:  Vitals:   09/02/19 1323  BP: 139/67  Pulse: 84  Resp: 18  Temp: 98.9 F (37.2 C)  SpO2: 98%   Wt Readings from Last 3 Encounters:  09/02/19 235 lb 8 oz (106.8 kg)  07/30/19 235 lb 12.8 oz (107 kg)  07/22/19 237 lb 1.6 oz (107.5 kg)   Body mass index is 39.19 kg/m.    ECOG FS:1 - Symptomatic but completely ambulatory GENERAL: Patient is a well appearing female in no acute distress HEENT:  Sclerae anicteric.  Oropharynx clear and moist. No ulcerations or evidence of oropharyngeal candidiasis. Neck is supple.  NODES:  No cervical, supraclavicular, or axillary lymphadenopathy palpated.  BREAST EXAM:  Right breast abscess healed, no sign of local recurrence, left breast benign LUNGS:  Clear to auscultation bilaterally.  No wheezes or rhonchi. HEART:  Regular rate and rhythm. No murmur appreciated. ABDOMEN:  Soft, nontender.  Positive, normoactive bowel sounds. No organomegaly palpated. MSK:  No focal spinal tenderness to palpation. Full range of motion bilaterally in the upper extremities. EXTREMITIES:  No peripheral edema.   SKIN:  Clear with no obvious rashes or skin changes. No nail dyscrasia. NEURO:  Nonfocal. Well oriented.  Appropriate affect.    LAB RESULTS:  CMP     Component Value Date/Time   NA 141 09/02/2019 1300   K 3.2 (L) 09/02/2019 1300   CL 100 09/02/2019 1300   CO2 29 09/02/2019 1300   GLUCOSE 182 (H) 09/02/2019 1300   BUN 26 (H) 09/02/2019 1300   CREATININE 1.81 (H) 09/02/2019 1300   CREATININE 1.98 (H) 08/12/2019 1447   CREATININE  1.02 09/23/2015 1047   CALCIUM 8.6 (L) 09/02/2019 1300   PROT 8.2 (H) 09/02/2019 1300   ALBUMIN 3.1 (L) 09/02/2019 1300   AST 42 (H) 09/02/2019 1300   AST 24 08/12/2019 1447   ALT 27 09/02/2019 1300   ALT 16 08/12/2019 1447   ALKPHOS 85 09/02/2019 1300   BILITOT <0.2 (L) 09/02/2019 1300   BILITOT 0.2 (L) 08/12/2019 1447   GFRNONAA 32 (L) 09/02/2019 1300   GFRNONAA 29 (L) 08/12/2019 1447   GFRAA 37 (L) 09/02/2019 1300   GFRAA 33 (L) 08/12/2019 1447    No results found for: TOTALPROTELP, ALBUMINELP, A1GS, A2GS, BETS, BETA2SER, GAMS, MSPIKE, SPEI  No results found for: KPAFRELGTCHN, LAMBDASER, KAPLAMBRATIO  Lab Results  Component Value Date   WBC 7.4 09/02/2019   NEUTROABS 4.8 09/02/2019   HGB 11.4 (L) 09/02/2019   HCT 34.3 (L) 09/02/2019   MCV 93.2 09/02/2019   PLT 174  09/02/2019    _0 @  No results found for: LABCA2  No components found for: [id number]  No results for input(s): INR in the last 168 hours.  No results found for: LABCA2  No results found for: OMV672  No results found for: CNO709  No results found for: GGE366  No results found for: CA2729  No components found for: HGQUANT  No results found for: CEA1 / No results found for: CEA1   No results found for: AFPTUMOR  No results found for: CHROMOGRNA  No results found for: PSA1  Appointment on 09/02/2019  Component Date Value Ref Range Status   WBC 09/02/2019 7.4  4.0 - 10.5 K/uL Final   RBC 09/02/2019 3.68* 3.87 - 5.11 MIL/uL Final   Hemoglobin 09/02/2019 11.4* 12.0 - 15.0 g/dL Final   HCT 09/02/2019 34.3* 36.0 - 46.0 % Final   MCV 09/02/2019 93.2  80.0 - 100.0 fL Final   MCH 09/02/2019 31.0  26.0 - 34.0 pg Final   MCHC 09/02/2019 33.2  30.0 - 36.0 g/dL Final   RDW 09/02/2019 16.6* 11.5 - 15.5 % Final   Platelets 09/02/2019 174  150 - 400 K/uL Final   nRBC 09/02/2019 0.0  0.0 - 0.2 % Final   Neutrophils Relative % 09/02/2019 64  % Final   Neutro Abs 09/02/2019 4.8   1.7 - 7.7 K/uL Final   Lymphocytes Relative 09/02/2019 26  % Final   Lymphs Abs 09/02/2019 1.9  0.7 - 4.0 K/uL Final   Monocytes Relative 09/02/2019 7  % Final   Monocytes Absolute 09/02/2019 0.5  0.1 - 1.0 K/uL Final   Eosinophils Relative 09/02/2019 2  % Final   Eosinophils Absolute 09/02/2019 0.1  0.0 - 0.5 K/uL Final   Basophils Relative 09/02/2019 1  % Final   Basophils Absolute 09/02/2019 0.0  0.0 - 0.1 K/uL Final   Immature Granulocytes 09/02/2019 0  % Final   Abs Immature Granulocytes 09/02/2019 0.03  0.00 - 0.07 K/uL Final   Performed at [facility], [place] [address], [place], [place] [address]   Sodium 09/02/2019 141  135 - 145 mmol/L Final   Potassium 09/02/2019 3.2* 3.5 - 5.1 mmol/L Final   Chloride 09/02/2019 100  98 - 111 mmol/L Final   CO2 09/02/2019 29  22 - 32 mmol/L Final   Glucose, Bld 09/02/2019 182* 70 - 99 mg/dL Final   BUN 09/02/2019 26* 6 - 20 mg/dL Final   Creatinine, Ser 09/02/2019 1.81* 0.44 - 1.00 mg/dL Final   Calcium 09/02/2019 8.6* 8.9 - 10.3 mg/dL Final   Total Protein 09/02/2019 8.2* 6.5 - 8.1 g/dL Final   Albumin 09/02/2019 3.1* 3.5 - 5.0 g/dL Final   AST 09/02/2019 42* 15 - 41 U/L Final   ALT 09/02/2019 27  0 - 44 U/L Final   Alkaline Phosphatase 09/02/2019 85  38 - 126 U/L Final   Total Bilirubin 09/02/2019 <0.2* 0.3 - 1.2 mg/dL Final   GFR calc non Af Amer 09/02/2019 32* >60 mL/min Final   GFR calc Af Amer 09/02/2019 37* >60 mL/min Final   Anion gap 09/02/2019 12  5 - 15 Final   Performed at [facility], [place] [address] [place], [place] [address]    (this displays the last labs from the last 3 days)  No results found for: TOTALPROTELP, ALBUMINELP, A1GS, A2GS, BETS, BETA2SER, GAMS, MSPIKE, SPEI (this displays SPEP labs)  No results found for: KPAFRELGTCHN, LAMBDASER, KAPLAMBRATIO (kappa/lambda light chains)  No results found for: HGBA, HGBA2QUANT, HGBFQUANT,  HGBSQUAN (Hemoglobinopathy evaluation)   No results found for: LDH  Lab Results  Component Value Date   IRON 26 (L) 02/08/2007   TIBC 371 02/08/2007   IRONPCTSAT 7 (L) 02/08/2007   (Iron and TIBC)  No results found for: FERRITIN  Urinalysis    Component Value Date/Time   COLORURINE YELLOW 06/14/2019 1045   APPEARANCEUR CLOUDY (A) 06/14/2019 1045   LABSPEC 1.013 06/14/2019 1045   PHURINE 6.0 06/14/2019 1045   GLUCOSEU NEGATIVE 06/14/2019 1045   GLUCOSEU NEG mg/dL 01/23/2008 1418   HGBUR LARGE (A) 06/14/2019 1045   BILIRUBINUR NEGATIVE 06/14/2019 1045   BILIRUBINUR negative 12/29/2017 1046   KETONESUR NEGATIVE 06/14/2019 1045   PROTEINUR >=300 (A) 06/14/2019 1045   UROBILINOGEN 0.2 12/29/2017 1046   UROBILINOGEN 0.2 10/21/2014 2347   NITRITE NEGATIVE 06/14/2019 1045   LEUKOCYTESUR LARGE (A) 06/14/2019 1045     STUDIES: Dg Shoulder Left  Result Date: 08/20/2019 CLINICAL DATA:  Shoulder pain while lifting, no trauma EXAM: LEFT SHOULDER - 2+ VIEW COMPARISON:  None. FINDINGS: No fracture or dislocation of the left shoulder. There is mild glenohumeral and moderate acromioclavicular arthrosis. There is a probable glenohumeral joint effusion. Left chest port catheter. IMPRESSION: 1. No fracture or dislocation of the left shoulder. There is mild glenohumeral and moderate acromioclavicular arthrosis. 2.  There is a probable glenohumeral joint effusion. Electronically Signed   By: Eddie Candle M.D.   On: 08/20/2019 12:13     ELIGIBLE FOR AVAILABLE RESEARCH PROTOCOL: UPBEAT   ASSESSMENT: 53 y.o. [place], [place] woman status post right breast upper inner quadrant biopsy 04/26/2018 for a clinical T2 N0, stage IB invasive ductal carcinoma, grade 2, ER/PR positive, HER-2 positive with an MIB-1 of 70%.  (1) genetics testing 07/03/2018: no pathogenic mutations. Genes Analyzed (43 total): APC*, ATM*, AXIN2, BARD1, BMPR1A, BRCA1*, BRCA2*, BRIP1*, CDH1*, CDK4, CDKN2A, CHEK2*, DICER1,GALNT12,  HOXB13, MEN1, MLH1*, MRE11A, MSH2*, MSH3, MSH6*, MUTYH*, NBN, NF1*, NTHL1, PALB2*, PMS2*, POLD1, POLE,PTEN*, RAD50, RAD51C*, RAD51D*, RET, SDHB, SDHD, SMAD4, SMARCA4, STK11 and TP53* (sequencing and deletion/duplication); EGFR (sequencing only); EPCAM and GREM1 (deletion/duplication only). DNA and RNA analyses performed for * genes.  (2) neoadjuvant chemotherapy consisting of carboplatin, docetaxel, trastuzumab and pertuzumab given every 21 days x 6, starting 05/31/2018, completed 09/14/2018  (a) Pertuzumab discontinued starting with cycle 2 because of diarrhea problems  (b) Taxotere stopped after 4 cycles due peripheral neuropathy and substituted for last two cycles with gemcitabine  (3)  definitive surgery on 10/03/2018 showed residual ypT1c ypN0 invasive ductal carcinoma, grade 2, with negative margins; a total of 5 sentinel lymph nodes removed; repeat prognostic panel again triple positive  (4) T-DM1 given adjuvantly due to residual disease: started 11/13/2018.  (a) echo on 05/16/2018 shows an EF of 60-65%.  (b) echo on 08/27/18 shows an EF 55-60%  (c) echo on 12/03/2018 shows an EF of 60-65%  (d) echocardiogram on 03/06/2019 showed an ejection fraction of 55% as read by Dr. Haroldine Laws.  (e) echocardiogram 06/20/2019 shows an ejection fraction in the 55-60% range  (5) adjuvant radiation completed 01/07/2019 - 02/21/2019 (delayed due to wound healing issues)  Site/dose:   The patient initially received a dose of 50.4 Gy in 28 fractions to the right breast using whole-breast tangent fields. This was delivered using a 3-D conformal technique. The patient then received a boost to the seroma. This delivered an additional 10 Gy in 5 fractions using 12e electrons with a special teletherapy technique. The total  dose was 60.4 Gy.  (6) Tamoxifen started 02/28/2019  (7) hidradenitis/boils: on doxycycline daily   PLAN: Liza is doing well today.  She continues on TDM1 with good tolerance.  She will  continue this through 10/2019.    We called her pharmacy about the tamoxifen.  They stated she picked it up yesterday, and she let me know she would check what she picked up and if she received it she plans on restarting it today.    Terah's shoulder is an issue for her.  I was going to prescribe Meloxicam, however her kidney function is elevated, (my nurse called and discontinued the script), we also discussed a low dose of prednisone for the shoulder to decrease inflammation, however, she doesn't want to take steroids.  She does have some fluid around the shoulder on x rays.  She will go see ortho and see if she can undergo arthrocentesis and perhaps a steroid injection in the shoulder.  After that she will likely benefit from PT.    Raniyah's breast is completely healed which is great.  I recommended she continue out of work so that she can be protected from [place] along with her physical side effects from breast cancer treatment that are interfering with her ability to work.    Geisha will return every 3 weeks for labs and treatment.  I will see her before her last treatment in January.  She was recommended to continue with the appropriate pandemic precautions. She knows to call for any questions that may arise between now and her next appointment.  We are happy to see her sooner if needed.  A total of (30) minutes of face-to-face time was spent with this patient with greater than 50% of that time in counseling and care-coordination.  Wilber Bihari, NP  09/03/19 5:25 AM Medical Oncology and Hematology [facility] [place], [place] [address] Tel. [phone]    Fax. [phone]

## 2019-09-02 NOTE — Progress Notes (Signed)
[facility] Spiritual Care Note  Followed up with Ms Coppernoll in infusion to connect in person after a meaningful phone call. Ms Branca was on the phone when I stopped by, so we kept our visit short. She was in good spirits, noting that this is a much better day. She knows how to reach chaplain as desired, but please also page if needs arise. Thank you.   [place], [place], [facility] Pager [phone] Voicemail [phone]

## 2019-09-02 NOTE — Patient Instructions (Signed)
[facility] Discharge Instructions for Patients Receiving Chemotherapy  Today you received the following chemotherapy agents Kadcyla  To help prevent nausea and vomiting after your treatment, we encourage you to take your nausea medication as directed   If you develop nausea and vomiting that is not controlled by your nausea medication, call the clinic.   BELOW ARE SYMPTOMS THAT SHOULD BE REPORTED IMMEDIATELY:  *FEVER GREATER THAN 100.5 F  *CHILLS WITH OR WITHOUT FEVER  NAUSEA AND VOMITING THAT IS NOT CONTROLLED WITH YOUR NAUSEA MEDICATION  *UNUSUAL SHORTNESS OF BREATH  *UNUSUAL BRUISING OR BLEEDING  TENDERNESS IN MOUTH AND THROAT WITH OR WITHOUT PRESENCE OF ULCERS  *URINARY PROBLEMS  *BOWEL PROBLEMS  UNUSUAL RASH Items with * indicate a potential emergency and should be followed up as soon as possible.  Feel free to call the clinic should you have any questions or concerns. The clinic phone number is [phone].  Please show the CHEMO ALERT CARD at check-in to the Emergency Department and triage nurse.   

## 2019-09-02 NOTE — Progress Notes (Signed)
Per Lindsey Causey NP, ok to treat with elevated creatinine. 

## 2019-09-03 ENCOUNTER — Telehealth: Payer: Self-pay | Admitting: Primary Care

## 2019-09-03 ENCOUNTER — Encounter: Payer: Self-pay | Admitting: *Deleted

## 2019-09-03 NOTE — Telephone Encounter (Signed)
Please notify patient that she needs repeat thyroid testing, please schedule lab only appointment.

## 2019-09-04 ENCOUNTER — Other Ambulatory Visit: Payer: 59

## 2019-09-04 NOTE — Telephone Encounter (Signed)
Message left for patient to return my call.  

## 2019-09-05 ENCOUNTER — Telehealth (HOSPITAL_COMMUNITY): Payer: Self-pay

## 2019-09-05 DIAGNOSIS — M25512 Pain in left shoulder: Secondary | ICD-10-CM | POA: Insufficient documentation

## 2019-09-05 NOTE — Telephone Encounter (Signed)
Called Patient to ask her about her [place] Sleep Study. She stated that no one had called her from [employer]. I gave her the number to [facility] and she said she would contact them today.

## 2019-09-06 ENCOUNTER — Other Ambulatory Visit: Payer: 59

## 2019-09-06 DIAGNOSIS — M7552 Bursitis of left shoulder: Secondary | ICD-10-CM

## 2019-09-06 HISTORY — DX: Bursitis of left shoulder: M75.52

## 2019-09-09 ENCOUNTER — Ambulatory Visit
Admission: RE | Admit: 2019-09-09 | Discharge: 2019-09-09 | Disposition: A | Discharge status: Home / Self Care | Payer: 59 | Source: Ambulatory Visit | Attending: Nephrology | Admitting: Nephrology

## 2019-09-09 DIAGNOSIS — N183 Chronic kidney disease, stage 3 unspecified: Secondary | ICD-10-CM

## 2019-09-18 NOTE — Telephone Encounter (Signed)
Message left for patient to return my call.  Also sent a letter to remind patient to schedule the lab appointment

## 2019-09-19 ENCOUNTER — Telehealth: Payer: Self-pay | Admitting: Oncology

## 2019-09-19 NOTE — Telephone Encounter (Signed)
Called patient regarding rescheduled appointments on 12/07, patient is notified. Rescheduled appointment times have been approved.

## 2019-09-23 ENCOUNTER — Encounter: Payer: Self-pay | Admitting: *Deleted

## 2019-09-23 ENCOUNTER — Inpatient Hospital Stay: Payer: 59 | Attending: Oncology

## 2019-09-23 ENCOUNTER — Ambulatory Visit: Payer: 59

## 2019-09-23 ENCOUNTER — Other Ambulatory Visit: Payer: 59

## 2019-09-23 ENCOUNTER — Inpatient Hospital Stay: Payer: 59

## 2019-09-23 ENCOUNTER — Other Ambulatory Visit: Payer: Self-pay

## 2019-09-23 VITALS — BP 121/72 | HR 79 | Temp 98.2°F | Resp 18 | Wt 231.8 lb

## 2019-09-23 DIAGNOSIS — C73 Malignant neoplasm of thyroid gland: Secondary | ICD-10-CM

## 2019-09-23 DIAGNOSIS — C50311 Malignant neoplasm of lower-inner quadrant of right female breast: Secondary | ICD-10-CM

## 2019-09-23 DIAGNOSIS — C50211 Malignant neoplasm of upper-inner quadrant of right female breast: Secondary | ICD-10-CM | POA: Diagnosis present

## 2019-09-23 DIAGNOSIS — Z170 Estrogen receptor positive status [ER+]: Secondary | ICD-10-CM

## 2019-09-23 DIAGNOSIS — Z5112 Encounter for antineoplastic immunotherapy: Secondary | ICD-10-CM | POA: Diagnosis not present

## 2019-09-23 DIAGNOSIS — Z95828 Presence of other vascular implants and grafts: Secondary | ICD-10-CM

## 2019-09-23 LAB — COMPREHENSIVE METABOLIC PANEL
ALT: 31 U/L (ref 0–44)
AST: 43 U/L — ABNORMAL HIGH (ref 15–41)
Albumin: 3 g/dL — ABNORMAL LOW (ref 3.5–5.0)
Alkaline Phosphatase: 92 U/L (ref 38–126)
Anion gap: 12 (ref 5–15)
BUN: 29 mg/dL — ABNORMAL HIGH (ref 6–20)
CO2: 28 mmol/L (ref 22–32)
Calcium: 9 mg/dL (ref 8.9–10.3)
Chloride: 100 mmol/L (ref 98–111)
Creatinine, Ser: 2.12 mg/dL — ABNORMAL HIGH (ref 0.44–1.00)
GFR calc Af Amer: 30 mL/min — ABNORMAL LOW (ref >60)
GFR calc non Af Amer: 26 mL/min — ABNORMAL LOW (ref >60)
Glucose, Bld: 213 mg/dL — ABNORMAL HIGH (ref 70–99)
Potassium: 3.2 mmol/L — ABNORMAL LOW (ref 3.5–5.1)
Sodium: 140 mmol/L (ref 135–145)
Total Bilirubin: 0.3 mg/dL (ref 0.3–1.2)
Total Protein: 8.2 g/dL — ABNORMAL HIGH (ref 6.5–8.1)

## 2019-09-23 LAB — CBC WITH DIFFERENTIAL/PLATELET
Abs Immature Granulocytes: 0.05 10*3/uL (ref 0.00–0.07)
Basophils Absolute: 0.1 10*3/uL (ref 0.0–0.1)
Basophils Relative: 1 %
Eosinophils Absolute: 0.1 10*3/uL (ref 0.0–0.5)
Eosinophils Relative: 1 %
HCT: 35.7 % — ABNORMAL LOW (ref 36.0–46.0)
Hemoglobin: 11.9 g/dL — ABNORMAL LOW (ref 12.0–15.0)
Immature Granulocytes: 1 %
Lymphocytes Relative: 22 %
Lymphs Abs: 1.9 10*3/uL (ref 0.7–4.0)
MCH: 30.7 pg (ref 26.0–34.0)
MCHC: 33.3 g/dL (ref 30.0–36.0)
MCV: 92.2 fL (ref 80.0–100.0)
Monocytes Absolute: 0.5 10*3/uL (ref 0.1–1.0)
Monocytes Relative: 6 %
Neutro Abs: 6 10*3/uL (ref 1.7–7.7)
Neutrophils Relative %: 69 %
Platelets: 108 10*3/uL — ABNORMAL LOW (ref 150–400)
RBC: 3.87 MIL/uL (ref 3.87–5.11)
RDW: 16.8 % — ABNORMAL HIGH (ref 11.5–15.5)
WBC: 8.6 10*3/uL (ref 4.0–10.5)
nRBC: 0 % (ref 0.0–0.2)

## 2019-09-23 MED ORDER — SODIUM CHLORIDE 0.9 % IV SOLN
360.0000 mg | Freq: Once | INTRAVENOUS | Status: AC
  Administered 2019-09-23: 360 mg via INTRAVENOUS
  Filled 2019-09-23: qty 10

## 2019-09-23 MED ORDER — ACETAMINOPHEN 325 MG PO TABS
650.0000 mg | ORAL_TABLET | Freq: Once | ORAL | Status: AC
  Administered 2019-09-23: 650 mg via ORAL

## 2019-09-23 MED ORDER — SODIUM CHLORIDE 0.9% FLUSH
10.0000 mL | INTRAVENOUS | Status: DC | PRN
  Administered 2019-09-23: 10 mL
  Filled 2019-09-23: qty 10

## 2019-09-23 MED ORDER — DIPHENHYDRAMINE HCL 25 MG PO CAPS
ORAL_CAPSULE | ORAL | Status: AC
  Filled 2019-09-23: qty 2

## 2019-09-23 MED ORDER — ACETAMINOPHEN 325 MG PO TABS
ORAL_TABLET | ORAL | Status: AC
  Filled 2019-09-23: qty 2

## 2019-09-23 MED ORDER — SODIUM CHLORIDE 0.9 % IV SOLN
Freq: Once | INTRAVENOUS | Status: AC
  Administered 2019-09-23: 15:00:00 via INTRAVENOUS
  Filled 2019-09-23: qty 250

## 2019-09-23 MED ORDER — HEPARIN SOD (PORK) LOCK FLUSH 100 UNIT/ML IV SOLN
500.0000 [IU] | Freq: Once | INTRAVENOUS | Status: AC | PRN
  Administered 2019-09-23: 500 [IU]
  Filled 2019-09-23: qty 5

## 2019-09-23 MED ORDER — DIPHENHYDRAMINE HCL 25 MG PO CAPS
25.0000 mg | ORAL_CAPSULE | Freq: Once | ORAL | Status: AC
  Administered 2019-09-23: 25 mg via ORAL

## 2019-09-23 NOTE — Patient Instructions (Signed)
[facility] Discharge Instructions for Patients Receiving Chemotherapy  Today you received the following chemotherapy agents Ado-trastuzumab [facility]).  To help prevent nausea and vomiting after your treatment, we encourage you to take your nausea medication as prescribed.   If you develop nausea and vomiting that is not controlled by your nausea medication, call the clinic.   BELOW ARE SYMPTOMS THAT SHOULD BE REPORTED IMMEDIATELY:  *FEVER GREATER THAN 100.5 F  *CHILLS WITH OR WITHOUT FEVER  NAUSEA AND VOMITING THAT IS NOT CONTROLLED WITH YOUR NAUSEA MEDICATION  *UNUSUAL SHORTNESS OF BREATH  *UNUSUAL BRUISING OR BLEEDING  TENDERNESS IN MOUTH AND THROAT WITH OR WITHOUT PRESENCE OF ULCERS  *URINARY PROBLEMS  *BOWEL PROBLEMS  UNUSUAL RASH Items with * indicate a potential emergency and should be followed up as soon as possible.  Feel free to call the clinic should you have any questions or concerns. The clinic phone number is [phone].  Please show the [place] at check-in to the Emergency Department and triage nurse.  Coronavirus (COVID-19) Are you at risk?  Are you at risk for the Coronavirus (COVID-19)?  To be considered HIGH RISK for Coronavirus (COVID-19), you have to meet the following criteria:  . Traveled to [place], [place], [place], [place] or [place]; or in the [place] to [place], [place], [place], or [place]; and have fever, cough, and shortness of breath within the last 2 weeks of travel OR . Been in close contact with a person diagnosed with COVID-19 within the last 2 weeks and have fever, cough, and shortness of breath . IF YOU DO NOT MEET THESE CRITERIA, YOU ARE CONSIDERED LOW RISK FOR COVID-19.  What to do if you are HIGH RISK for COVID-19?  Marland Kitchen If you are having a medical emergency, call 911. . Seek medical care right away. Before you go to a doctor's office, urgent care or emergency department, call ahead and tell  them about your recent travel, contact with someone diagnosed with COVID-19, and your symptoms. You should receive instructions from your physician's office regarding next steps of care.  . When you arrive at healthcare provider, tell the healthcare staff immediately you have returned from visiting [place], [place], [place], [place] or [place]; or traveled in the [place] to [place], [place], [place], or [place]; in the last two weeks or you have been in close contact with a person diagnosed with COVID-19 in the last 2 weeks.   . Tell the health care staff about your symptoms: fever, cough and shortness of breath. . After you have been seen by a medical provider, you will be either: o Tested for (COVID-19) and discharged home on quarantine except to seek medical care if symptoms worsen, and asked to  - Stay home and avoid contact with others until you get your results (4-5 days)  - Avoid travel on public transportation if possible (such as bus, train, or airplane) or o Sent to the Emergency Department by EMS for evaluation, COVID-19 testing, and possible admission depending on your condition and test results.  What to do if you are LOW RISK for COVID-19?  Reduce your risk of any infection by using the same precautions used for avoiding the common cold or flu:  Marland Kitchen Wash your hands often with soap and warm water for at least 20 seconds.  If soap and water are not readily available, use an alcohol-based hand sanitizer with at least 60% alcohol.  . If coughing or  sneezing, cover your mouth and nose by coughing or sneezing into the elbow areas of your shirt or coat, into a tissue or into your sleeve (not your hands). . Avoid shaking hands with others and consider head nods or verbal greetings only. . Avoid touching your eyes, nose, or mouth with unwashed hands.  . Avoid close contact with people who are sick. . Avoid places or events with large numbers of people in one location, like concerts or  sporting events. . Carefully consider travel plans you have or are making. . If you are planning any travel outside or inside the [place], visit the CDC's Travelers' Health webpage for the latest health notices. . If you have some symptoms but not all symptoms, continue to monitor at home and seek medical attention if your symptoms worsen. . If you are having a medical emergency, call 911.   [name] / e-Visit: [email or web address]         [facility]: [facility]: [phone]                   [facility]: [phone]

## 2019-09-23 NOTE — Progress Notes (Signed)
Verbal order from Dr. [name]: okay to treat patient with current Scr., administer 257ml NS fluid over 30 minutes.

## 2019-10-05 ENCOUNTER — Other Ambulatory Visit: Payer: Self-pay | Admitting: Internal Medicine

## 2019-10-05 DIAGNOSIS — B20 Human immunodeficiency virus [HIV] disease: Secondary | ICD-10-CM

## 2019-10-08 ENCOUNTER — Other Ambulatory Visit: Payer: Self-pay | Admitting: Primary Care

## 2019-10-08 DIAGNOSIS — E890 Postprocedural hypothyroidism: Secondary | ICD-10-CM

## 2019-10-10 ENCOUNTER — Ambulatory Visit: Payer: 59

## 2019-10-10 DIAGNOSIS — I10 Essential (primary) hypertension: Secondary | ICD-10-CM

## 2019-10-10 NOTE — Procedures (Signed)
    Sleep Study Report  Patient Information Name: [name]  ID: [id number] Birth Date: 1966-10-04  Age: 53  Gender: Female Insurer: BMI: 38.9 (W=233 lb, H=5' 5'') Study Date:09/16/2019 Referring Physician:Daniel Bensimhon, MD  Summary & Diagnosis  TEST DESCRIPTION: Home sleep apnea testing was completed using the WatchPat, a Type 1 device, utilizing peripheral arterial tonometry (PAT), chest movement, actigraphy, pulse oximetry, pulse rate, body position and snore. AHI was calculated with apnea and hypopnea using valid sleep time as the denominator. RDI includes apneas, hypopneas, and RERAs. The data acquired and the scoring of sleep and all associated events were performed in accordance with the recommended standards and specifications as outlined in the AASM Manual for the Scoring of Sleep and Associated Events 2.2.0 (2015).  FINDINGS: 1. Mild obstructive sleep apnea with AHI 9.1/hr. 2. No Central sleep apnea. 3. Minimum O2 saturation was 87%. There was no nocturnal hypoxemia. 4. Mild snoring was noted. 5. Normal sleep onset latency at 34 minutes. 6. Delayed REM sleep onset at 105 minutes. 7. There were 10 awakenings during sleep. 8. Total sleep time was 7 hours and 2 minutes.  DIAGNOSIS: Mild Obstructive Sleep Apnea (G47.33)  RECOMMENDATIONS: 1. Clinical correlation of these findings is necessary. The decision to treat obstructive sleep apnea (OSA) is usually based on the presence of apnea symptoms or the presence of associated medical conditions such as Hypertension, Congestive Heart Failure, Atrial Fibrillation or Obesity. The most common symptoms of OSA are snoring, gasping for breath while sleeping, daytime sleepiness and fatigue.  2. Initiating apnea therapy is recommended given the presence of symptoms and/or associated conditions. Recommend proceeding with one of the following:   a. Auto-CPAP therapy with a pressure range of 5-20cm H2O.   b. An oral appliance  (OA) that can be obtained from certain dentists with expertise in sleep medicine. These are primarily of use in non-obese patients with mild and moderate disease.   c. An ENT consultation which may be useful to look for specific causes of obstruction and possible treatment Options.   d. If patient is intolerant to PAP therapy, consider referral to ENT for evaluation for hypoglossal nerve stimulator.  3. Close follow-up is necessary to ensure success with CPAP or oral appliance therapy for maximum benefit .  4. A follow-up oximetry study on CPAP is recommended to assess the adequacy of therapy and determine the need for supplemental oxygen or the potential need for Bi-level therapy. An arterial blood gas to determine the adequacy of baseline ventilation and oxygenation should also be considered.  5. Healthy sleep recommendations include: adequate nightly sleep (normal 7-9 hrs/night), avoidance of caffeine after noon and alcohol near bedtime, and maintaining a sleep environment that is cool, dark and quiet.  6. Weight loss for overweight patients is recommended. Even modest amounts of weight loss can significantly improve the severity of sleep apnea.  7. Snoring recommendations include: weight loss where appropriate, side sleeping, and avoidance of alcohol before Bed.  8. Operation of motor vehicle or dangerous equipment must be avoided when feeling drowsy, excessively sleepy, or mentally fatigued.  Report prepared by: Signature: Fransico Him, MD  Electronically Signed: Oct 10, 2019

## 2019-10-14 ENCOUNTER — Inpatient Hospital Stay: Payer: 59

## 2019-10-14 ENCOUNTER — Other Ambulatory Visit: Payer: Self-pay

## 2019-10-14 ENCOUNTER — Telehealth: Payer: Self-pay | Admitting: *Deleted

## 2019-10-14 VITALS — BP 154/86 | HR 82 | Temp 98.6°F | Resp 17 | Ht 65.0 in | Wt 233.8 lb

## 2019-10-14 DIAGNOSIS — R319 Hematuria, unspecified: Secondary | ICD-10-CM

## 2019-10-14 DIAGNOSIS — C50211 Malignant neoplasm of upper-inner quadrant of right female breast: Secondary | ICD-10-CM

## 2019-10-14 DIAGNOSIS — C50311 Malignant neoplasm of lower-inner quadrant of right female breast: Secondary | ICD-10-CM

## 2019-10-14 DIAGNOSIS — Z170 Estrogen receptor positive status [ER+]: Secondary | ICD-10-CM

## 2019-10-14 DIAGNOSIS — C73 Malignant neoplasm of thyroid gland: Secondary | ICD-10-CM

## 2019-10-14 DIAGNOSIS — Z95828 Presence of other vascular implants and grafts: Secondary | ICD-10-CM

## 2019-10-14 DIAGNOSIS — Z5112 Encounter for antineoplastic immunotherapy: Secondary | ICD-10-CM | POA: Diagnosis not present

## 2019-10-14 LAB — URINALYSIS, COMPLETE (UACMP) WITH MICROSCOPIC
Bilirubin Urine: NEGATIVE
Glucose, UA: NEGATIVE mg/dL
Ketones, ur: NEGATIVE mg/dL
Nitrite: NEGATIVE
Protein, ur: 100 mg/dL — AB
RBC / HPF: >50 RBC/hpf — ABNORMAL HIGH (ref 0–5)
Specific Gravity, Urine: 1.011 (ref 1.005–1.030)
WBC, UA: >50 WBC/hpf — ABNORMAL HIGH (ref 0–5)
pH: 5 (ref 5.0–8.0)

## 2019-10-14 LAB — CBC WITH DIFFERENTIAL/PLATELET
Abs Immature Granulocytes: 0.06 10*3/uL (ref 0.00–0.07)
Basophils Absolute: 0 10*3/uL (ref 0.0–0.1)
Basophils Relative: 0 %
Eosinophils Absolute: 0.1 10*3/uL (ref 0.0–0.5)
Eosinophils Relative: 2 %
HCT: 33.8 % — ABNORMAL LOW (ref 36.0–46.0)
Hemoglobin: 11.1 g/dL — ABNORMAL LOW (ref 12.0–15.0)
Immature Granulocytes: 1 %
Lymphocytes Relative: 23 %
Lymphs Abs: 1.6 10*3/uL (ref 0.7–4.0)
MCH: 30.7 pg (ref 26.0–34.0)
MCHC: 32.8 g/dL (ref 30.0–36.0)
MCV: 93.6 fL (ref 80.0–100.0)
Monocytes Absolute: 0.6 10*3/uL (ref 0.1–1.0)
Monocytes Relative: 8 %
Neutro Abs: 4.5 10*3/uL (ref 1.7–7.7)
Neutrophils Relative %: 66 %
Platelets: 102 10*3/uL — ABNORMAL LOW (ref 150–400)
RBC: 3.61 MIL/uL — ABNORMAL LOW (ref 3.87–5.11)
RDW: 17.1 % — ABNORMAL HIGH (ref 11.5–15.5)
WBC: 6.8 10*3/uL (ref 4.0–10.5)
nRBC: 0 % (ref 0.0–0.2)

## 2019-10-14 LAB — COMPREHENSIVE METABOLIC PANEL
ALT: 29 U/L (ref 0–44)
AST: 47 U/L — ABNORMAL HIGH (ref 15–41)
Albumin: 2.7 g/dL — ABNORMAL LOW (ref 3.5–5.0)
Alkaline Phosphatase: 89 U/L (ref 38–126)
Anion gap: 11 (ref 5–15)
BUN: 24 mg/dL — ABNORMAL HIGH (ref 6–20)
CO2: 28 mmol/L (ref 22–32)
Calcium: 8.6 mg/dL — ABNORMAL LOW (ref 8.9–10.3)
Chloride: 101 mmol/L (ref 98–111)
Creatinine, Ser: 1.87 mg/dL — ABNORMAL HIGH (ref 0.44–1.00)
GFR calc Af Amer: 35 mL/min — ABNORMAL LOW (ref >60)
GFR calc non Af Amer: 31 mL/min — ABNORMAL LOW (ref >60)
Glucose, Bld: 191 mg/dL — ABNORMAL HIGH (ref 70–99)
Potassium: 3 mmol/L — CL (ref 3.5–5.1)
Sodium: 140 mmol/L (ref 135–145)
Total Bilirubin: 0.3 mg/dL (ref 0.3–1.2)
Total Protein: 7.7 g/dL (ref 6.5–8.1)

## 2019-10-14 MED ORDER — SODIUM CHLORIDE 0.9 % IV SOLN
Freq: Once | INTRAVENOUS | Status: AC
  Filled 2019-10-14: qty 250

## 2019-10-14 MED ORDER — DIPHENHYDRAMINE HCL 25 MG PO CAPS
ORAL_CAPSULE | ORAL | Status: AC
  Filled 2019-10-14: qty 1

## 2019-10-14 MED ORDER — ACETAMINOPHEN 325 MG PO TABS
ORAL_TABLET | ORAL | Status: AC
  Filled 2019-10-14: qty 2

## 2019-10-14 MED ORDER — SODIUM CHLORIDE 0.9% FLUSH
10.0000 mL | INTRAVENOUS | Status: DC | PRN
  Administered 2019-10-14: 10 mL
  Filled 2019-10-14: qty 10

## 2019-10-14 MED ORDER — DIPHENHYDRAMINE HCL 25 MG PO CAPS
25.0000 mg | ORAL_CAPSULE | Freq: Once | ORAL | Status: AC
  Administered 2019-10-14: 25 mg via ORAL

## 2019-10-14 MED ORDER — SODIUM CHLORIDE 0.9 % IV SOLN
360.0000 mg | Freq: Once | INTRAVENOUS | Status: AC
  Administered 2019-10-14: 360 mg via INTRAVENOUS
  Filled 2019-10-14: qty 8

## 2019-10-14 MED ORDER — HEPARIN SOD (PORK) LOCK FLUSH 100 UNIT/ML IV SOLN
500.0000 [IU] | Freq: Once | INTRAVENOUS | Status: AC | PRN
  Administered 2019-10-14: 500 [IU]
  Filled 2019-10-14: qty 5

## 2019-10-14 MED ORDER — ACETAMINOPHEN 325 MG PO TABS
650.0000 mg | ORAL_TABLET | Freq: Once | ORAL | Status: AC
  Administered 2019-10-14: 650 mg via ORAL

## 2019-10-14 NOTE — Telephone Encounter (Signed)
Received lab results on patient. K+ is 3.0.  Spoke with Cecille Rubin, LPN with Dr. [name]. She will discuss with Dr. Lindi Adie

## 2019-10-14 NOTE — Progress Notes (Signed)
Per Dr. Lindi Adie: OK to treat with elevated creatinine and low potassium.  Pt reported hematuria x2 since 10/10/19. Denied any fevers, dysuria, lower back pain, or urgency. She did mention that she's had bladder infections in the past. She also reported hand and leg cramping the past 2 nights (potassium level today 3.0). Called Dr. Lindi Adie with update and received orders to obtain a UA before pt leaves and for pt to take 7mEq of potassium PO daily. Instructions relayed to pt and included in AVS. Pt verbalized understanding and agreement, and knows to call clinic back should symptoms not improve or worsen.  15:56: Pt declined to stay for 30 min post Kadcyla observation period. Ambulated out of clinic without incudent

## 2019-10-14 NOTE — Patient Instructions (Signed)
**  Please start taking 20 mEq of oral potassium every day. If you hand/leg cramping don't improve in 1 week call Dr. Virgie Dad nurse with an update and follow-up. Thank you!**  [facility] Discharge Instructions for Patients Receiving Chemotherapy  Today you received the following chemotherapy agents: Ado-Trastuzumab Emtansine (Kadcyla)  To help prevent nausea and vomiting after your treatment, we encourage you to take your nausea medication as directed.   If you develop nausea and vomiting that is not controlled by your nausea medication, call the clinic.   BELOW ARE SYMPTOMS THAT SHOULD BE REPORTED IMMEDIATELY:  *FEVER GREATER THAN 100.5 F  *CHILLS WITH OR WITHOUT FEVER  NAUSEA AND VOMITING THAT IS NOT CONTROLLED WITH YOUR NAUSEA MEDICATION  *UNUSUAL SHORTNESS OF BREATH  *UNUSUAL BRUISING OR BLEEDING  TENDERNESS IN MOUTH AND THROAT WITH OR WITHOUT PRESENCE OF ULCERS  *URINARY PROBLEMS  *BOWEL PROBLEMS  UNUSUAL RASH Items with * indicate a potential emergency and should be followed up as soon as possible.  Feel free to call the clinic should you have any questions or concerns. The clinic phone number is [phone].  Please show the [place] at check-in to the Emergency Department and triage nurse.

## 2019-10-15 ENCOUNTER — Other Ambulatory Visit: Payer: Self-pay | Admitting: Oncology

## 2019-10-15 MED ORDER — CIPROFLOXACIN HCL 500 MG PO TABS: 500.0000 mg | ORAL_TABLET | Freq: Two times a day (BID) | ORAL | 0 refills | Status: DC

## 2019-10-15 NOTE — Progress Notes (Unsigned)
[name]'s potassium was 3.0 yesterday.  We are calling her she already has a prescription in place.

## 2019-10-16 ENCOUNTER — Telehealth: Payer: Self-pay | Admitting: *Deleted

## 2019-10-16 ENCOUNTER — Encounter: Payer: Self-pay | Admitting: Cardiology

## 2019-10-16 DIAGNOSIS — G4733 Obstructive sleep apnea (adult) (pediatric): Secondary | ICD-10-CM

## 2019-10-16 NOTE — Telephone Encounter (Signed)
Informed patient of sleep study results and patient understanding was verbalized. Patient understands his sleep study showed they have sleep apnea and recommend auto CPAP titration through [facility].  Please set 10 week OV with me.    DME selection is [facility]. Patient understands he will be contacted by [facility] to set up his cpap. Patient understands to call if [facility] does not contact him with new setup in a timely manner. Patient understands they will be called once confirmation has been received from [facility] that they have received their new machine to schedule 10 week follow up appointment.  [facility] notified of new cpap order  Please add to airview Patient was grateful for the call and thanked me.

## 2019-10-16 NOTE — Telephone Encounter (Signed)
-----   Message from Sueanne Margarita, MD sent at 10/10/2019  8:22 PM EST ----- Please let patient know that they have sleep apnea and recommend auto CPAP titration through [facility].  Orders have been placed in Epic. Please set 10 week OV with me.

## 2019-10-17 ENCOUNTER — Other Ambulatory Visit: Payer: Self-pay | Admitting: Oncology

## 2019-11-01 ENCOUNTER — Other Ambulatory Visit: Payer: Self-pay | Admitting: Primary Care

## 2019-11-01 NOTE — Telephone Encounter (Signed)
Noted, discontinued metformin from med list. She actually needs an in office visit. Please schedule.

## 2019-11-01 NOTE — Telephone Encounter (Signed)
Called Pt back and she stated she No longer take Metformin. Pt stated she takes Glipizide for her diabetes. Pt also stated that she would call the office back on Monday to schedule for a lab appt.

## 2019-11-01 NOTE — Telephone Encounter (Signed)
Please contact patient to see if she is taking her metformin medication for diabetes. Please also remind her that she is overdue for thyroid check and A1C. Can we get her scheduled for an appointment? Thanks.

## 2019-11-01 NOTE — Telephone Encounter (Signed)
Looks like med was d/c by PCP in Oct 2020 saying due to side eff, and then it looks like ID added med back as a historical entry, please advise

## 2019-11-03 NOTE — Progress Notes (Signed)
[place]  [phone] [phone]    ID: [name] DOB: 04-09-1966  MR#: [id number]  GUR#:[id number]  Patient Care Team: [name], NP as PCP - General (Internal Medicine) Michel Bickers, MD as PCP - Infectious Diseases (Infectious Diseases) Magrinat, Virgie Dad, MD as Consulting Physician (Oncology) Kyung Rudd, MD as Consulting Physician (Radiation Oncology) Erroll Luna, MD as Consulting Physician (General Surgery) OTHER MD:   CHIEF COMPLAINT: Triple positive breast cancer  CURRENT TREATMENT: Completing a year of adjuvant anti-HER-2 treatment,: on tamoxifen   INTERVAL HISTORY: [name] returns today for follow-up and treatment of her triple positive breast cancer.    She completes her adjuvant TDM1 treatments today.  [name]'s last echocardiogram on 06/20/2019, showed an ejection fraction in the 55% - 60% range.  She is scheduled for her final echocardiogram 11/27/2019  She also continues on tamoxifen.  She takes it at night and does have some hot flashes that sometimes wake her up she does not have other side effects from this that she is aware of.  Since her last visit, she underwent renal ultrasound on 09/09/2019 for her stage 3 chronic kidney disease. This showed: no acute findings; re-demonstrated bulky staghorn calculi.   REVIEW OF SYSTEMS: [name] feels tired all the time and has occasional problems with nausea and vomiting.  This seems to come out of nowhere and is not obviously related temporally to her treatments.  She just bought a house and she is pleased about that.  She thinks she has been fired because she got a paper that says that she needs to apply for [employer].  She is not aware of having received termination papers or pink slip.  She says that her employer was not very understanding or flexible with her cancer and its treatment.  She wonders if she is going to be disabled because she feels tired all the time and  she says she knows some people who after being treated for cancer they never felt well again.  She is taking appropriate pandemic precautions.  A detailed review of systems today was otherwise stable   HISTORY OF CURRENT ILLNESS: From the original intake note:  [name] (pronounced "Huff") palpated a mass in the right breast for about 2 weeks before bringing it to medical attention. She underwent bilateral diagnostic mammography with tomography and right breast ultrasonography at [facility] on 04/20/2018 showing: breast density category B. There was a highly suspicious mass located at 3 o'clock in the upper inner quadrant measuring 2.9 x 2.5 x 1.6 cm and 4 cm from the nipple. Ultrasound of the right axilla showed 5 lymph nodes with abnormal cortical thickening. The left axilla showed 3 lymph nodes with abnormal cortical thickening.  Accordingly on 04/26/2018 she proceeded to biopsy of the right breast area in question. The pathology from this procedure showed ([id number]): Invasive ductal carcinoma, grade II with calcifications. One right axillary lymph node and one left axillary lymph node were negative for carcinoma. Prognostic indicators significant for: estrogen receptor, 100% positive and progesterone receptor, 80% positive, both with strong staining intensity. Proliferation marker Ki67 at 70%. HER2 amplified with ratios HER2/CEP17 signals 4.52 and average HER2 copies per cell 10.85  The patient's subsequent history is as detailed below.   PAST MEDICAL HISTORY: Past Medical History:  Diagnosis Date  . Allergy   . Anemia    Normocytic  . Anxiety   . Asthma   . Blood dyscrasia   . Bronchitis 2005  .  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 Diabetes mellitus without complication ([place])   . Family history of breast cancer   . Family history of lung cancer   . Family history of prostate cancer   . Gastroenteritis 07/10/07  . GERD 07/24/2006  . GRAVE'S DISEASE  01/01/2008  . History of hidradenitis suppurativa   . History of kidney stones   . History of thrush   . HIV DISEASE 07/24/2006   dx March 05  . Hyperlipidemia   . HYPERTENSION 07/24/2006  . Hyperthyroidism 08/2006   Grave's Disease -diffuse radiotracer uptake 08/25/06 Thyroid scan-Cold nodule to R lower lobe of thyrorid  . Menometrorrhagia    hx of  . Nephrolithiasis   . 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 Personal history of chemotherapy    2020  . Personal history of radiation therapy    2020  . Pneumonia 2005  . 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 Sarcoidosis 02/08/2007   dx as a teenager in [place] from abnl CXR. Completed 2 yrs Prednisone after lung bx confirmation. No symptoms since then.  . Suppurative hidradenitis   . Thyroid cancer ([place])   . THYROID NODULE, RIGHT 02/08/2007  -2018 thyroid nodules were precancerous but pathology was negative for thyroid cancer.    PAST SURGICAL HISTORY: Past Surgical History:  Procedure Laterality Date  . BREAST EXCISIONAL BIOPSY Right 04/26/2018   right axilla negative  . BREAST EXCISIONAL BIOPSY Left 04/26/2018   left axilla negative  . BREAST LUMPECTOMY Right 10/03/2018   malignant  . BREAST LUMPECTOMY WITH RADIOACTIVE SEED AND SENTINEL LYMPH NODE BIOPSY Right 10/03/2018   Procedure: RIGHT BREAST LUMPECTOMY WITH RADIOACTIVE SEED AND SENTINEL LYMPH NODE MAPPING;  Surgeon: Erroll Luna, MD;  Location: [facility];  Service: General;  Laterality: Right;  . BREAST SURGERY  1997   Breast Reduction   . CYSTOSCOPY W/ URETERAL STENT REMOVAL  11/09/2012   Procedure: CYSTOSCOPY WITH STENT REMOVAL;  Surgeon: Alexis Frock, MD;  [facility];  Service: Urology;  Laterality: Right;  . CYSTOSCOPY WITH RETROGRADE PYELOGRAM, URETEROSCOPY AND STENT PLACEMENT  11/09/2012   Procedure: CYSTOSCOPY WITH RETROGRADE PYELOGRAM, URETEROSCOPY AND STENT PLACEMENT;  Surgeon: Alexis Frock, MD;  [facility];  Service: Urology;  Laterality: Left;  LEFT URETEROSCOPY, STONE MANIPULATION, left STENT exchange   . CYSTOSCOPY WITH STENT PLACEMENT  10/02/2012   Procedure: CYSTOSCOPY WITH STENT PLACEMENT;  Surgeon: Alexis Frock, MD;  [facility];  Service: Urology;  Laterality: Left;  . DILATION AND CURETTAGE OF UTERUS  Feb 2004   s/p for 1st trimester nonviable pregnancy  . EYE SURGERY     sty under eyelid  . INCISE AND DRAIN ABCESS  Nov 03   s/p I &D for righ inframmary fold hidradenitis  . INCISION AND DRAINAGE PERITONSILLAR ABSCESS  Mar 03  . IR CV LINE INJECTION  06/07/2018  . IR IMAGING GUIDED PORT INSERTION  06/20/2018  . IR REMOVAL TUN ACCESS W/ PORT W/O FL MOD SED  06/20/2018  . IRRIGATION AND DEBRIDEMENT ABSCESS  01/31/2012   Procedure: IRRIGATION AND DEBRIDEMENT ABSCESS;  Surgeon: Shann Medal, MD;  [facility];  Service: General;  Laterality: Right;  right breast and axilla   . NEPHROLITHOTOMY  10/02/2012   Procedure: NEPHROLITHOTOMY PERCUTANEOUS;  Surgeon: Alexis Frock, MD;  [facility];  Service: Urology;  Laterality: Right;  First Stage Percutaneous Nephrolithotomy with Surgeon Access, Left Ureteral Stent    . NEPHROLITHOTOMY  10/04/2012  Procedure: NEPHROLITHOTOMY PERCUTANEOUS SECOND LOOK;  Surgeon: Alexis Frock, MD;  [facility];  Service: Urology;  Laterality: Right;     . NEPHROLITHOTOMY  10/08/2012   Procedure: NEPHROLITHOTOMY PERCUTANEOUS;  Surgeon: Alexis Frock, MD;  [facility];  Service: Urology;  Laterality: Right;  THIRD STAGE, nephrostomy tube exchange x 2  . NEPHROLITHOTOMY  10/11/2012   Procedure: NEPHROLITHOTOMY PERCUTANEOUS SECOND LOOK;  Surgeon: Alexis Frock, MD;  [facility];  Service: Urology;  Laterality: Right;  RIGHT 4 STAGE PERCUTANOUS NEPHROLITHOTOMY, right URETEROSCOPY WITH HOLMIUM LASER   . PORTACATH PLACEMENT Left 05/17/2018   Procedure: INSERTION PORT-A-CATH;  Surgeon: Coralie Keens, MD;   Location: [facility];  Service: General;  Laterality: Left;  . RADICAL NECK DISSECTION  04/12/2017   limited/notes 04/12/2017  . RADICAL NECK DISSECTION N/A 04/12/2017   Procedure: RADICAL NECK DISSECTION;  Surgeon: Melida Quitter, MD;  Location: [facility];  Service: ENT;  Laterality: N/A;  limited neck dissection 2 hours total  . REDUCTION MAMMAPLASTY Bilateral 1998  . [place]  1994  . THYROIDECTOMY  04/12/2017   completion/notes 04/12/2017  . THYROIDECTOMY N/A 04/12/2017   Procedure: THYROIDECTOMY;  Surgeon: Melida Quitter, MD;  Location: [facility];  Service: ENT;  Laterality: N/A;  Completion Thyroidectomy  . TOTAL THYROIDECTOMY  2010  Thyroid nodules removed- pathology at the ENT doctor 2018 were benign -Over active thyroid and thyroidectomy with benign pathology   FAMILY HISTORY Family History  Problem Relation Age of Onset  . Hypertension Mother   . Cancer Mother        laryngeal  . Heart disease Mother        stent  . Hypertension Father   . Lung cancer Father 75       hx smoking  . Heart disease Other   . Hypertension Other   . Stroke Other        Grandparent  . Kidney disease Other        Grandparent  . Diabetes Other        FH of Diabetes  . Hypertension Sister   . Cancer Maternal Uncle        Lung CA  . Hypertension Brother   . Hypertension Sister   . Breast cancer Maternal Aunt 65  . Breast cancer Paternal Aunt 58  . Prostate cancer Paternal Uncle   . Breast cancer Maternal Aunt        dx 60+  . Breast cancer Paternal Aunt        dx 25's  . Breast cancer Paternal Aunt        dx 78's  . Prostate cancer Paternal Uncle   . Lung cancer Paternal Uncle   . Breast cancer Cousin 57  . Breast cancer Cousin        dx <50  . Breast cancer Cousin        dx <50  . Breast cancer Cousin        dx <50  . Colon cancer Neg Hx   . Esophageal cancer Neg Hx   . Rectal cancer Neg Hx   . Stomach cancer Neg Hx   The patient's mother is alive at age 67. The patient's  father died at 64 from lung cancer (heavy smoker). She does not know much about her father. The patient has 1 brother and 2 sisters. The patient's mother had a history of laryngeal cancer. There was a maternal cousin with breast cancer diagnosed at age 38. There were 2  maternal aunts with breast cancer, the youngest being diagnosed in the 11's. There was a maternal great aunt with breast cancer. There were 2 paternal aunts with breast cancer.    GYNECOLOGIC HISTORY:  Patient's last menstrual period was 03/31/2014. Menarche: 54 years old Age at first live birth: 54 years old She is GX P1.  Her LMP was in 2017. She did not use HRT.    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 She is single. At home is the patient's daughter, [name], age 61, who is a Engineer, technical sales education and working in Therapist, art.  The patient attends [employer].   ADVANCED DIRECTIVES: Not in place; at the 05/10/2018 visit the patient was given the appropriate documents to complete and notarized at her discretion   HEALTH MAINTENANCE: Social History   Tobacco Use  . Smoking status: Former Smoker    Packs/day: 0.50    Years: 15.00    Pack years: 7.50    Types: Cigarettes    Start date: 04/12/2017  . Smokeless tobacco: Never Used  . Tobacco comment: quit smoking  may 2018  Substance Use Topics  . Alcohol use: Yes    Alcohol/week: 0.0 standard drinks    Comment: social  . Drug use: No     Colonoscopy: no  PAP: [facility]. 2018  Bone density: remote   Allergies  Allergen Reactions  . Genvoya [Elviteg-Cobic-Emtricit-Tenofaf] Hives  . Lisinopril Cough  . Valsartan Other (See Comments) and Cough    Pt states she tolerates medicine now    Current Outpatient Medications  Medication Sig Dispense Refill  . amLODipine (NORVASC) 10 MG tablet TAKE 1 TABLET BY MOUTH EVERY DAY FOR BLOOD PRESSURE (Patient  taking differently: Take 10 mg by mouth daily. ) 90 tablet 1  . Blood Glucose Monitoring Suppl (ONETOUCH VERIO) w/Device KIT USE AS INSTRUCTED TO TEST BLOOD SUGAR UP TO 4 TIMES DAILY E11.9 1 kit 0  . ciprofloxacin (CIPRO) 500 MG tablet Take 1 tablet (500 mg total) by mouth 2 (two) times daily. 10 tablet 0  . dapagliflozin propanediol (FARXIGA) 10 MG TABS tablet Take 10 mg by mouth daily before breakfast. 30 tablet 6  . emtricitabine-tenofovir AF (DESCOVY) 200-25 MG tablet Take 1 tablet by mouth daily. 30 tablet 2  . FLOVENT HFA 110 MCG/ACT inhaler TAKE 1 PUFF BY MOUTH TWICE A DAY 36 Inhaler 1  . fluconazole (DIFLUCAN) 100 MG tablet Take 1 tablet (100 mg total) by mouth daily. 20 tablet 0  . glipiZIDE (GLUCOTROL) 5 MG tablet TAKE 1 TABLET (5 MG TOTAL) BY MOUTH 2 (TWO) TIMES DAILY BEFORE A MEAL. FOR DIABETES. 180 tablet 1  . glucose blood (ONETOUCH VERIO) test strip USE AS INSTRUCTED TO TEST BLOOD SUGAR DAILY 100 each 2  . HYDROcodone-acetaminophen (NORCO/VICODIN) 5-325 MG tablet 1/2 to 1 PO Q 6 hours prn pain 15 tablet 0  . irbesartan-hydrochlorothiazide (AVALIDE) 150-12.5 MG tablet TAKE 1 TABLET BY MOUTH ONCE DAILY FOR BLOOD PRESSURE. (Patient taking differently: Take 1 tablet by mouth daily. ) 90 tablet 1  . Lancets (ONETOUCH ULTRASOFT) lancets Use as instructed to test blood sugar daily 100 each 5  . levothyroxine (SYNTHROID) 175 MCG tablet TAKE 1 TABLET BY MOUTH EVERY MORNING ON AN EMPTY STOMACH WITH A FULL GLASS OF WATER. 90 tablet 0  . magic mouthwash SOLN Take 5 mLs by mouth 3 (three) times daily as needed for mouth pain. 150 mL  2  . metFORMIN (GLUCOPHAGE) 500 MG tablet     . metoprolol succinate (TOPROL-XL) 50 MG 24 hr tablet TAKE 1 TABLET (50 MG TOTAL) BY MOUTH DAILY. TAKE WITH OR IMMEDIATELY FOLLOWING A MEAL. 90 tablet 0  . nystatin (MYCOSTATIN) 100000 UNIT/ML suspension     . omeprazole (PRILOSEC) 40 MG capsule TAKE 1 CAPSULE BY MOUTH EVERY DAY 90 capsule 1  . ondansetron (ZOFRAN) 8 MG  tablet Take 1 tablet (8 mg total) by mouth every 8 (eight) hours as needed for nausea or vomiting. (Patient not taking: Reported on 07/30/2019) 40 tablet 2  . potassium chloride SA (KLOR-CON) 20 MEQ tablet Take 20 mEq by mouth daily.    . rosuvastatin (CRESTOR) 10 MG tablet TAKE 1 TABLET BY MOUTH EVERY DAY FOR CHOLESTEROL 90 tablet 1  . spironolactone (ALDACTONE) 25 MG tablet Take 25 mg by mouth daily.    . tamoxifen (NOLVADEX) 20 MG tablet Take 1 tablet (20 mg total) by mouth daily. 90 tablet 1  . [name] 50 MG tablet TAKE 1 TABLET BY MOUTH EVERY DAY (Patient taking differently: Take 50 mg by mouth daily. ) 30 tablet 2   No current facility-administered medications for this visit.   Facility-Administered Medications Ordered in Other Visits  Medication Dose Route Frequency Provider Last Rate Last Admin  . sodium chloride flush (NS) 0.9 % injection 10 mL  10 mL Intracatheter PRN Magrinat, Virgie Dad, MD   10 mL at 09/19/18 1551    OBJECTIVE: Middle-aged African-American woman who appears stated age  20:   11/04/19 1331  BP: 140/89  Pulse: 83  Resp: 18  Temp: 98.4 F (36.9 C)  SpO2: 94%   Wt Readings from Last 3 Encounters:  11/04/19 234 lb 12.8 oz (106.5 kg)  10/14/19 233 lb 12 oz (106 kg)  09/23/19 231 lb 12 oz (105.1 kg)   Body mass index is 39.07 kg/m.    ECOG FS:1 - Symptomatic but completely ambulatory  Sclerae unicteric, EOMs intact Wearing a mask No cervical or supraclavicular adenopathy Lungs no rales or rhonchi Heart regular rate and rhythm Abd soft, nontender, positive bowel sounds MSK no focal spinal tenderness, no upper extremity lymphedema Neuro: nonfocal, well oriented, appropriate affect Breasts: The right breast is status post lumpectomy and radiation.  There is significant hyperpigmentation.  It is smaller than the left breast and has slightly different contour.  No evidence of local recurrence.  Both axillae are benign.   LAB RESULTS:  CMP       Component Value Date/Time   NA 138 11/04/2019 1246   K 3.3 (L) 11/04/2019 1246   CL 100 11/04/2019 1246   CO2 28 11/04/2019 1246   GLUCOSE 257 (H) 11/04/2019 1246   BUN 31 (H) 11/04/2019 1246   CREATININE 2.10 (H) 11/04/2019 1246   CREATININE 1.98 (H) 08/12/2019 1447   CREATININE 1.02 09/23/2015 1047   CALCIUM 8.3 (L) 11/04/2019 1246   PROT 7.7 11/04/2019 1246   ALBUMIN 2.7 (L) 11/04/2019 1246   AST 56 (H) 11/04/2019 1246   AST 24 08/12/2019 1447   ALT 35 11/04/2019 1246   ALT 16 08/12/2019 1447   ALKPHOS 104 11/04/2019 1246   BILITOT <0.2 (L) 11/04/2019 1246   BILITOT 0.2 (L) 08/12/2019 1447   GFRNONAA 27 (L) 11/04/2019 1246   GFRNONAA 29 (L) 08/12/2019 1447   GFRAA 31 (L) 11/04/2019 1246   GFRAA 33 (L) 08/12/2019 1447    No results found for: TOTALPROTELP, ALBUMINELP, A1GS, A2GS, BETS, BETA2SER,  GAMS, MSPIKE, SPEI  No results found for: KPAFRELGTCHN, LAMBDASER, KAPLAMBRATIO  Lab Results  Component Value Date   WBC 6.5 11/04/2019   NEUTROABS 4.2 11/04/2019   HGB 10.7 (L) 11/04/2019   HCT 32.3 (L) 11/04/2019   MCV 92.8 11/04/2019   PLT 84 (L) 11/04/2019   No results found for: LABCA2  No components found for: [id number]  No results for input(s): INR in the last 168 hours.  No results found for: LABCA2  No results found for: ATF573  No results found for: UKG254  No results found for: YHC623  No results found for: CA2729  No components found for: HGQUANT  No results found for: CEA1 / No results found for: CEA1   No results found for: AFPTUMOR  No results found for: CHROMOGRNA  No results found for: HGBA, HGBA2QUANT, HGBFQUANT, HGBSQUAN (Hemoglobinopathy evaluation)   No results found for: LDH  Lab Results  Component Value Date   IRON 26 (L) 02/08/2007   TIBC 371 02/08/2007   IRONPCTSAT 7 (L) 02/08/2007   (Iron and TIBC)  No results found for: FERRITIN  Urinalysis    Component Value Date/Time   COLORURINE YELLOW 10/14/2019 1600    APPEARANCEUR CLOUDY (A) 10/14/2019 1600   LABSPEC 1.011 10/14/2019 1600   PHURINE 5.0 10/14/2019 1600   GLUCOSEU NEGATIVE 10/14/2019 1600   GLUCOSEU NEG mg/dL 01/23/2008 1418   HGBUR LARGE (A) 10/14/2019 1600   BILIRUBINUR NEGATIVE 10/14/2019 1600   BILIRUBINUR negative 12/29/2017 1046   KETONESUR NEGATIVE 10/14/2019 1600   PROTEINUR 100 (A) 10/14/2019 1600   UROBILINOGEN 0.2 12/29/2017 1046   UROBILINOGEN 0.2 10/21/2014 2347   NITRITE NEGATIVE 10/14/2019 1600   LEUKOCYTESUR LARGE (A) 10/14/2019 1600    STUDIES: No results found.   ELIGIBLE FOR AVAILABLE RESEARCH PROTOCOL: declined DCP study  ASSESSMENT: 54 y.o. [place], [place] woman status post right breast upper inner quadrant biopsy 04/26/2018 for a clinical T2 N0, stage IB invasive ductal carcinoma, grade 2, ER/PR positive, HER-2 positive with an MIB-1 of 70%.  (1) genetics testing 07/03/2018: no pathogenic mutations. Genes Analyzed (43 total): APC*, ATM*, AXIN2, BARD1, BMPR1A, BRCA1*, BRCA2*, BRIP1*, CDH1*, CDK4, CDKN2A, CHEK2*, DICER1,GALNT12, HOXB13, MEN1, MLH1*, MRE11A, MSH2*, MSH3, MSH6*, MUTYH*, NBN, NF1*, NTHL1, PALB2*, PMS2*, POLD1, POLE,PTEN*, RAD50, RAD51C*, RAD51D*, RET, SDHB, SDHD, SMAD4, SMARCA4, STK11 and TP53* (sequencing and deletion/duplication); EGFR (sequencing only); EPCAM and GREM1 (deletion/duplication only). DNA and RNA analyses performed for * genes.  (2) neoadjuvant chemotherapy consisting of carboplatin, docetaxel, trastuzumab and pertuzumab given every 21 days x 6, starting 05/31/2018, completed 09/14/2018  (a) Pertuzumab discontinued starting with cycle 2 because of diarrhea problems  (b) Taxotere stopped after 4 cycles due peripheral neuropathy and substituted for last two cycles with gemcitabine  (3)  definitive surgery on 10/03/2018 showed residual ypT1c ypN0 invasive ductal carcinoma, grade 2, with negative margins; a total of 5 sentinel lymph nodes removed; repeat prognostic panel again triple  positive  (4) T-DM1 given adjuvantly due to residual disease: started 11/13/2018, completed 11/04/2019.  (a) echo on 05/16/2018 shows an EF of 60-65%.  (b) echo on 08/27/18 shows an EF 55-60%  (c) echo on 12/03/2018 shows an EF of 60-65%  (d) echocardiogram on 03/06/2019 showed an ejection fraction of 55% as read by Dr. Haroldine Laws.  (e) echocardiogram 06/20/2019 shows an ejection fraction in the 55-60% range  (5) adjuvant radiation completed 01/07/2019 - 02/21/2019 (delayed due to wound healing issues)  Site/dose:   The patient initially received a dose of 50.4 Gy in  28 fractions to the right breast using whole-breast tangent fields. This was delivered using a 3-D conformal technique. The patient then received a boost to the seroma. This delivered an additional 10 Gy in 5 fractions using 12e electrons with a special teletherapy technique. The total dose was 60.4 Gy.  (6) tamoxifen started 02/28/2019  (7) hidradenitis/boils: on doxycycline    PLAN: Jeilani completes a full year of adjuvant T-DM1.  I am hopeful this effort on her part will pay dividends and she will have long-term disease-free survival.  She has some fatigue and some nausea which may be related to the treatments.  If so I would think after a few weeks off therapy they should clear.  She is scheduled for 1 final echocardiogram in February.  She wonders if she is going to be disabled because she knows some people that after chemotherapy they were never able to get back to work again.  From a cancer point of view of course she has no disease at present that we can find and I am hopeful she is cured.  It is not clear if she was fired or simply there has been some insurance change but of course she can apply for unemployment insurance if she did lose her job and we discussed that.  We do have some social workers that can help her out as needed with regards to that  I have sent a note to her surgeon Dr. Brantley Stage so her port may be  removed at his and her discretion  She is continuing on tamoxifen with good tolerance and the plan is for her to receive a minimum of 5 years of that drug.  She will return in 79month for a full survivorship visit.  She knows to call for any other issue that may develop before the next visit.  Total encounter time 35 minutes.*Sarajane JewsC. Marcell Pfeifer, MD  11/04/19 1:56 PM [facility] [place] [id number]. [phone]   Fax. [phone]  I, KWilburn Mylar am acting as scribe for Dr. GVirgie Dad Kwaku Mostafa.  I, GLurline DelMD, have reviewed the above documentation for accuracy and completeness, and I agree with the above.    *Total Encounter Time as defined by the Centers for Medicare and Medicaid Services includes, in addition to the face-to-face time of a patient visit (documented in the note above) non-face-to-face time: obtaining and reviewing outside history, ordering and reviewing medications, tests or procedures, care coordination (communications with other health care professionals or caregivers) and documentation in the medical record.

## 2019-11-04 ENCOUNTER — Inpatient Hospital Stay (HOSPITAL_BASED_OUTPATIENT_CLINIC_OR_DEPARTMENT_OTHER): Payer: Self-pay | Admitting: Oncology

## 2019-11-04 ENCOUNTER — Other Ambulatory Visit: Payer: Self-pay

## 2019-11-04 ENCOUNTER — Encounter: Payer: Self-pay | Admitting: *Deleted

## 2019-11-04 ENCOUNTER — Inpatient Hospital Stay: Payer: Self-pay | Attending: Oncology

## 2019-11-04 ENCOUNTER — Inpatient Hospital Stay: Payer: Self-pay

## 2019-11-04 VITALS — BP 140/89 | HR 83 | Temp 98.4°F | Resp 18 | Ht 65.0 in | Wt 234.8 lb

## 2019-11-04 DIAGNOSIS — Z5112 Encounter for antineoplastic immunotherapy: Secondary | ICD-10-CM | POA: Insufficient documentation

## 2019-11-04 DIAGNOSIS — Z170 Estrogen receptor positive status [ER+]: Secondary | ICD-10-CM

## 2019-11-04 DIAGNOSIS — C50311 Malignant neoplasm of lower-inner quadrant of right female breast: Secondary | ICD-10-CM

## 2019-11-04 DIAGNOSIS — C50211 Malignant neoplasm of upper-inner quadrant of right female breast: Secondary | ICD-10-CM | POA: Insufficient documentation

## 2019-11-04 DIAGNOSIS — Z95828 Presence of other vascular implants and grafts: Secondary | ICD-10-CM

## 2019-11-04 DIAGNOSIS — D869 Sarcoidosis, unspecified: Secondary | ICD-10-CM

## 2019-11-04 DIAGNOSIS — E119 Type 2 diabetes mellitus without complications: Secondary | ICD-10-CM

## 2019-11-04 DIAGNOSIS — C73 Malignant neoplasm of thyroid gland: Secondary | ICD-10-CM

## 2019-11-04 LAB — CBC WITH DIFFERENTIAL/PLATELET
Abs Immature Granulocytes: 0.07 10*3/uL (ref 0.00–0.07)
Basophils Absolute: 0 10*3/uL (ref 0.0–0.1)
Basophils Relative: 0 %
Eosinophils Absolute: 0.1 10*3/uL (ref 0.0–0.5)
Eosinophils Relative: 2 %
HCT: 32.3 % — ABNORMAL LOW (ref 36.0–46.0)
Hemoglobin: 10.7 g/dL — ABNORMAL LOW (ref 12.0–15.0)
Immature Granulocytes: 1 %
Lymphocytes Relative: 26 %
Lymphs Abs: 1.7 10*3/uL (ref 0.7–4.0)
MCH: 30.7 pg (ref 26.0–34.0)
MCHC: 33.1 g/dL (ref 30.0–36.0)
MCV: 92.8 fL (ref 80.0–100.0)
Monocytes Absolute: 0.4 10*3/uL (ref 0.1–1.0)
Monocytes Relative: 6 %
Neutro Abs: 4.2 10*3/uL (ref 1.7–7.7)
Neutrophils Relative %: 65 %
Platelets: 84 10*3/uL — ABNORMAL LOW (ref 150–400)
RBC: 3.48 MIL/uL — ABNORMAL LOW (ref 3.87–5.11)
RDW: 17.7 % — ABNORMAL HIGH (ref 11.5–15.5)
WBC: 6.5 10*3/uL (ref 4.0–10.5)
nRBC: 0.3 % — ABNORMAL HIGH (ref 0.0–0.2)

## 2019-11-04 LAB — COMPREHENSIVE METABOLIC PANEL
ALT: 35 U/L (ref 0–44)
AST: 56 U/L — ABNORMAL HIGH (ref 15–41)
Albumin: 2.7 g/dL — ABNORMAL LOW (ref 3.5–5.0)
Alkaline Phosphatase: 104 U/L (ref 38–126)
Anion gap: 10 (ref 5–15)
BUN: 31 mg/dL — ABNORMAL HIGH (ref 6–20)
CO2: 28 mmol/L (ref 22–32)
Calcium: 8.3 mg/dL — ABNORMAL LOW (ref 8.9–10.3)
Chloride: 100 mmol/L (ref 98–111)
Creatinine, Ser: 2.1 mg/dL — ABNORMAL HIGH (ref 0.44–1.00)
GFR calc Af Amer: 31 mL/min — ABNORMAL LOW (ref >60)
GFR calc non Af Amer: 27 mL/min — ABNORMAL LOW (ref >60)
Glucose, Bld: 257 mg/dL — ABNORMAL HIGH (ref 70–99)
Potassium: 3.3 mmol/L — ABNORMAL LOW (ref 3.5–5.1)
Sodium: 138 mmol/L (ref 135–145)
Total Bilirubin: <0.2 mg/dL — ABNORMAL LOW (ref 0.3–1.2)
Total Protein: 7.7 g/dL (ref 6.5–8.1)

## 2019-11-04 MED ORDER — ACETAMINOPHEN 325 MG PO TABS
ORAL_TABLET | ORAL | Status: AC
  Filled 2019-11-04: qty 2

## 2019-11-04 MED ORDER — ACETAMINOPHEN 325 MG PO TABS
650.0000 mg | ORAL_TABLET | Freq: Once | ORAL | Status: AC
  Administered 2019-11-04: 650 mg via ORAL

## 2019-11-04 MED ORDER — SODIUM CHLORIDE 0.9% FLUSH
10.0000 mL | INTRAVENOUS | Status: DC | PRN
  Administered 2019-11-04: 16:00:00 10 mL
  Filled 2019-11-04: qty 10

## 2019-11-04 MED ORDER — SODIUM CHLORIDE 0.9 % IV SOLN
360.0000 mg | Freq: Once | INTRAVENOUS | Status: AC
  Administered 2019-11-04: 360 mg via INTRAVENOUS
  Filled 2019-11-04: qty 10

## 2019-11-04 MED ORDER — HEPARIN SOD (PORK) LOCK FLUSH 100 UNIT/ML IV SOLN
500.0000 [IU] | Freq: Once | INTRAVENOUS | Status: AC | PRN
  Administered 2019-11-04: 500 [IU]
  Filled 2019-11-04: qty 5

## 2019-11-04 MED ORDER — SODIUM CHLORIDE 0.9% FLUSH
10.0000 mL | INTRAVENOUS | Status: DC | PRN
  Administered 2019-11-04: 13:00:00 10 mL
  Filled 2019-11-04: qty 10

## 2019-11-04 MED ORDER — DIPHENHYDRAMINE HCL 25 MG PO CAPS
ORAL_CAPSULE | ORAL | Status: AC
  Filled 2019-11-04: qty 2

## 2019-11-04 MED ORDER — DIPHENHYDRAMINE HCL 25 MG PO CAPS
25.0000 mg | ORAL_CAPSULE | Freq: Once | ORAL | Status: AC
  Administered 2019-11-04: 15:00:00 25 mg via ORAL

## 2019-11-04 MED ORDER — SODIUM CHLORIDE 0.9 % IV SOLN
Freq: Once | INTRAVENOUS | Status: AC
  Filled 2019-11-04: qty 250

## 2019-11-04 NOTE — Progress Notes (Signed)
Per Dr [name] OK to proceed with treatment today with PLT of 84 and CRT of 2.1

## 2019-11-04 NOTE — Patient Instructions (Signed)

## 2019-11-05 ENCOUNTER — Telehealth: Payer: Self-pay | Admitting: Oncology

## 2019-11-05 NOTE — Telephone Encounter (Signed)
I talk with patient regarding schedule  

## 2019-11-14 ENCOUNTER — Encounter: Payer: Self-pay | Admitting: Pharmacy Technician

## 2019-11-14 NOTE — Progress Notes (Signed)
The patient is approved for drug assistance by [place] for [name].  Enrollment starts on 11/13/19 and is based on self-pay. Drug replacement will begin for DOS 11/04/19. Prior ins with [employer]MR termed 10/18/19

## 2019-11-17 ENCOUNTER — Other Ambulatory Visit: Payer: Self-pay | Admitting: Internal Medicine

## 2019-11-17 DIAGNOSIS — B20 Human immunodeficiency virus [HIV] disease: Secondary | ICD-10-CM

## 2019-11-18 ENCOUNTER — Other Ambulatory Visit: Payer: Self-pay

## 2019-11-18 ENCOUNTER — Telehealth: Payer: Self-pay | Admitting: Pharmacy Technician

## 2019-11-18 ENCOUNTER — Other Ambulatory Visit: Payer: Self-pay | Admitting: Pharmacist

## 2019-11-18 ENCOUNTER — Telehealth: Payer: Self-pay

## 2019-11-18 DIAGNOSIS — B20 Human immunodeficiency virus [HIV] disease: Secondary | ICD-10-CM

## 2019-11-18 MED ORDER — DESCOVY 200-25 MG PO TABS: 1.0000 | ORAL_TABLET | Freq: Every day | ORAL | 2 refills | Status: DC

## 2019-11-18 MED ORDER — TIVICAY 50 MG PO TABS: 50.0000 mg | ORAL_TABLET | Freq: Every day | ORAL | 2 refills | Status: DC

## 2019-11-18 NOTE — Progress Notes (Signed)
Rerouting HIV medications to go to [facility] since patient is approved for HMAP.

## 2019-11-18 NOTE — Telephone Encounter (Addendum)
[facility] Patient Advocate Encounter   Patient has been approved for [employer] Advancing Access Patient Assistance Program for [place] for 11/18/2019 to 11/17/2020. This assistance will make the patient's copay $0.  I have spoken with the patient and they will need to [facility]. UMAP application has been sent 02/01 and currently pending.  The billing information is Member ID: [id number] [place]: [id number] PCN: [id number] Group: [id number]  [facility] Patient Advocate Encounter   Was successful in obtaining a [facility] emergency fill for [name] for immediate access one time fill.  The billing information is: RxBin: [id number] PCN: [id number] Member ID: [id number] Group ID: [id number]  Bartholomew Crews, CPhT Specialty Pharmacy Patient [facility] Phone: [phone] Fax: [phone] 11/19/2019 12:01 PM

## 2019-11-18 NOTE — Telephone Encounter (Signed)
Called patient Adap application is done and sent over information to pharmacy to do med assistance.

## 2019-11-18 NOTE — Telephone Encounter (Signed)
Called patient to schedule overdue appointment with office after receiving refill request for medication. Patient was able to take call; is scheduled for labs and office visit two weeks after. Patient states during call that she recently lost her job and is now uninsured. Forwarded message to Development worker, community to reach out to to patient to begin process for ADAP. Patient has been off meds for 3 days as of today.  [name]

## 2019-11-19 ENCOUNTER — Telehealth: Payer: Self-pay

## 2019-11-19 NOTE — Telephone Encounter (Signed)
-----   Message from [place], [place] sent at 11/18/2019 11:26 AM EST ----- Regarding: RE: No insurance. Needs Bridge with pharmacy Called pt ADAp application done and sent information to pharmacy for medication assistance  ----- Message ----- From: Eugenia Mcalpine, LPN Sent: 0/05/8109   9:13 AM EST To: Mylinda Latina, NT, [facility] Subject: No insurance. Needs Bridge with pharmacy       Patient states her employment status has recently changed and she no longer has insurance coverage. Patient needs to connect with financial department and pharmacy for bridge assistance until coverage effective. Please follow up with patient.  Thanks

## 2019-11-20 ENCOUNTER — Other Ambulatory Visit: Payer: Self-pay

## 2019-11-20 ENCOUNTER — Ambulatory Visit: Payer: Self-pay

## 2019-11-20 ENCOUNTER — Encounter: Payer: Self-pay | Admitting: Internal Medicine

## 2019-11-20 DIAGNOSIS — Z79899 Other long term (current) drug therapy: Secondary | ICD-10-CM

## 2019-11-20 DIAGNOSIS — B20 Human immunodeficiency virus [HIV] disease: Secondary | ICD-10-CM

## 2019-11-21 LAB — T-HELPER CELL (CD4) - (RCID CLINIC ONLY)
CD4 % Helper T Cell: 37 % (ref 33–65)
CD4 T Cell Abs: 414 /uL (ref 400–1790)

## 2019-11-22 LAB — LIPID PANEL
Cholesterol: 217 mg/dL — ABNORMAL HIGH (ref <200)
HDL: 40 mg/dL — ABNORMAL LOW (ref >=50)
LDL Cholesterol (Calc): 141 mg/dL (calc) — ABNORMAL HIGH
Non-HDL Cholesterol (Calc): 177 mg/dL (calc) — ABNORMAL HIGH (ref <130)
Total CHOL/HDL Ratio: 5.4 (calc) — ABNORMAL HIGH (ref <5.0)
Triglycerides: 221 mg/dL — ABNORMAL HIGH (ref <150)

## 2019-11-22 LAB — HIV-1 RNA QUANT-NO REFLEX-BLD
HIV 1 RNA Quant: <20 copies/mL — AB
HIV-1 RNA Quant, Log: <1.3 Log copies/mL — AB

## 2019-11-27 ENCOUNTER — Encounter (HOSPITAL_COMMUNITY): Payer: 59 | Admitting: Internal Medicine

## 2019-11-27 ENCOUNTER — Ambulatory Visit (HOSPITAL_COMMUNITY): Admission: RE | Admit: 2019-11-27 | Payer: Self-pay | Source: Ambulatory Visit

## 2019-12-06 ENCOUNTER — Ambulatory Visit: Payer: 59 | Admitting: Primary Care

## 2019-12-09 ENCOUNTER — Telehealth: Payer: Self-pay

## 2019-12-09 ENCOUNTER — Ambulatory Visit: Payer: Self-pay | Admitting: Internal Medicine

## 2019-12-09 NOTE — Telephone Encounter (Signed)
COVID-19 Pre-Screening Questions:12/09/19  Do you currently have a fever (>100 F), chills or unexplained body aches?NO   Are you currently experiencing new cough, shortness of breath, sore throat, runny nose? NO  .  Have you recently travelled outside the state of [place] in the last 14 days? NO .  Have you been in contact with someone that is currently pending confirmation of Covid19 testing or has been confirmed to have the [place] virus?  NO  **If the patient answers NO to ALL questions -  advise the patient to please call the clinic before coming to the office should any symptoms develop.

## 2019-12-10 ENCOUNTER — Other Ambulatory Visit: Payer: Self-pay

## 2019-12-10 ENCOUNTER — Ambulatory Visit (INDEPENDENT_AMBULATORY_CARE_PROVIDER_SITE_OTHER): Payer: Self-pay | Admitting: Internal Medicine

## 2019-12-10 ENCOUNTER — Encounter: Payer: Self-pay | Admitting: Internal Medicine

## 2019-12-10 DIAGNOSIS — F331 Major depressive disorder, recurrent, moderate: Secondary | ICD-10-CM

## 2019-12-10 DIAGNOSIS — L0293 Carbuncle, unspecified: Secondary | ICD-10-CM

## 2019-12-10 DIAGNOSIS — T451X5A Adverse effect of antineoplastic and immunosuppressive drugs, initial encounter: Secondary | ICD-10-CM

## 2019-12-10 DIAGNOSIS — R310 Gross hematuria: Secondary | ICD-10-CM

## 2019-12-10 DIAGNOSIS — B20 Human immunodeficiency virus [HIV] disease: Secondary | ICD-10-CM

## 2019-12-10 DIAGNOSIS — G620 Drug-induced polyneuropathy: Secondary | ICD-10-CM | POA: Insufficient documentation

## 2019-12-10 DIAGNOSIS — R319 Hematuria, unspecified: Secondary | ICD-10-CM | POA: Insufficient documentation

## 2019-12-10 MED ORDER — DOXYCYCLINE HYCLATE 100 MG PO TABS: 100.0000 mg | ORAL_TABLET | Freq: Two times a day (BID) | ORAL | 11 refills | Status: DC

## 2019-12-10 NOTE — Progress Notes (Signed)
Patient Active Problem List   Diagnosis Date Noted  . Malignant neoplasm of lower-inner quadrant of right breast of female, estrogen receptor positive ([place]) 05/10/2018    Priority: Medium  . Hidradenitis suppurativa of right >> left axilla 01/30/2012    Priority: Medium  . 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 Cigarette smoker 02/08/2007    Priority: Medium  . HIV disease ([place]) 07/24/2006    Priority: Medium  . Depression 07/24/2006    Priority: Medium  . Essential hypertension 07/24/2006    Priority: Medium  . Asthma 07/24/2006    Priority: Medium  . Hematuria 12/10/2019  . Neuropathy due to chemotherapeutic drug ([place]) 12/10/2019  . Dysphagia 07/23/2019  . CKD stage 3 due to type 2 diabetes mellitus ([place]) 07/12/2019  . Normocytic anemia 07/12/2019  . Preventative health care 09/10/2018  . Genetic testing 07/25/2018  . Port-A-Cath in place 07/12/2018  . Family history of breast cancer   . Family history of prostate cancer   . Family history of lung cancer   . Hyperlipidemia 04/26/2017  . Chronic anxiety 04/11/2017  . Upper airway cough syndrome 11/11/2016  . Morbid (severe) obesity due to excess calories ([place]) 10/14/2016  . Allergic rhinitis 02/18/2016  . Non-insulin dependent type 2 diabetes mellitus ([place]) 06/23/2015  . Renal calculi 10/29/2014  . Recurrent boils 06/11/2014  . Sarcoidosis 02/08/2007  . GERD 07/24/2006    Patient's Medications  New Prescriptions   DOXYCYCLINE (VIBRA-TABS) 100 MG TABLET    Take 1 tablet (100 mg total) by mouth 2 (two) times daily.  Previous Medications   AMLODIPINE (NORVASC) 10 MG TABLET    TAKE 1 TABLET BY MOUTH EVERY DAY FOR BLOOD PRESSURE   BLOOD GLUCOSE MONITORING SUPPL (ONETOUCH VERIO) W/DEVICE KIT    USE AS INSTRUCTED TO TEST BLOOD SUGAR UP TO 4 TIMES DAILY E11.9   DAPAGLIFLOZIN PROPANEDIOL (FARXIGA) 10 MG TABS TABLET    Take 10 mg by mouth daily before breakfast.   DOLUTEGRAVIR ([name]) 50  MG TABLET    Take 1 tablet (50 mg total) by mouth daily.   EMTRICITABINE-TENOFOVIR AF (DESCOVY) 200-25 MG TABLET    Take 1 tablet by mouth daily.   FLOVENT HFA 110 MCG/ACT INHALER    TAKE 1 PUFF BY MOUTH TWICE A DAY   GLIPIZIDE (GLUCOTROL) 5 MG TABLET    TAKE 1 TABLET (5 MG TOTAL) BY MOUTH 2 (TWO) TIMES DAILY BEFORE A MEAL. FOR DIABETES.   GLUCOSE BLOOD (ONETOUCH VERIO) TEST STRIP    USE AS INSTRUCTED TO TEST BLOOD SUGAR DAILY   IRBESARTAN-HYDROCHLOROTHIAZIDE (AVALIDE) 150-12.5 MG TABLET    TAKE 1 TABLET BY MOUTH ONCE DAILY FOR BLOOD PRESSURE.   LANCETS (ONETOUCH ULTRASOFT) LANCETS    Use as instructed to test blood sugar daily   LEVOTHYROXINE (SYNTHROID) 175 MCG TABLET    TAKE 1 TABLET BY MOUTH EVERY MORNING ON AN EMPTY STOMACH WITH A FULL GLASS OF WATER.   MAGIC MOUTHWASH SOLN    Take 5 mLs by mouth 3 (three) times daily as needed for mouth pain.   METFORMIN (GLUCOPHAGE) 500 MG TABLET       METOPROLOL SUCCINATE (TOPROL-XL) 50 MG 24 HR TABLET    TAKE 1 TABLET (50 MG TOTAL) BY MOUTH DAILY. TAKE WITH OR IMMEDIATELY FOLLOWING A MEAL.   NYSTATIN (MYCOSTATIN) 100000 UNIT/ML SUSPENSION       OMEPRAZOLE (PRILOSEC) 40 MG CAPSULE    TAKE 1 CAPSULE BY  MOUTH EVERY DAY   ONDANSETRON (ZOFRAN) 8 MG TABLET    Take 1 tablet (8 mg total) by mouth every 8 (eight) hours as needed for nausea or vomiting.   POTASSIUM CHLORIDE SA (KLOR-CON) 20 MEQ TABLET    Take 20 mEq by mouth daily.   ROSUVASTATIN (CRESTOR) 10 MG TABLET    TAKE 1 TABLET BY MOUTH EVERY DAY FOR CHOLESTEROL   SPIRONOLACTONE (ALDACTONE) 25 MG TABLET    Take 25 mg by mouth daily.   TAMOXIFEN (NOLVADEX) 20 MG TABLET    Take 1 tablet (20 mg total) by mouth daily.  Modified Medications   No medications on file  Discontinued Medications   No medications on file    Subjective: [name] is in for her routine HIV follow-up visit.  She lost her job in December.  She only found out about being fired when she went to pick up her medications and was told that  her work-related insurance had lapsed.  She was unable to get her Descovy or [name] for 2 weeks until she was approved for ADAP.  She has not missed any doses since.  She recently completed chemotherapy for her breast cancer.  She has been bothered by numbness of fingers.  She says that she will sometimes drop things.  Her oncologist, Dr. [name], has cleared her to have her Port-A-Cath removed but she is waiting until she can get health insurance again.  She has been out of her doxycycline past week.  She feels that it definitely helps suppress her recurrent boils.  Over the past week she has developed hematuria and urinary frequency.  She has not had any dysuria.  She has had some mild left flank pain.  Not sure if she is having problems with recurrent kidney stones or has a urinary tract infection.  Given everything that is going on she has been much more depressed recently.  She has already scheduled a visit next week with Jessica Priest, our behavioral health counselor.  Review of Systems: Review of Systems  Constitutional: Positive for malaise/fatigue. Negative for chills, diaphoresis, fever and weight loss.  HENT: Negative for congestion and sore throat.   Respiratory: Negative for cough and shortness of breath.   Cardiovascular: Negative for chest pain.  Gastrointestinal: Negative for abdominal pain, diarrhea, nausea and vomiting.       She has been having more frequent soft stools but no watery diarrhea.  Genitourinary: Positive for flank pain, frequency and hematuria. Negative for dysuria and urgency.  Musculoskeletal: Negative for back pain and myalgias.  Skin: Negative for rash.  Neurological: Positive for tingling and sensory change.  Psychiatric/Behavioral: Positive for depression.    Past Medical History:  Diagnosis Date  . Allergy   . Anemia    Normocytic  . Anxiety   . Asthma   . Blood dyscrasia   . Bronchitis 2005  . 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  Diabetes mellitus without complication ([place])   . Family history of breast cancer   . Family history of lung cancer   . Family history of prostate cancer   . Gastroenteritis 07/10/07  . GERD 07/24/2006  . GRAVE'S DISEASE 01/01/2008  . History of hidradenitis suppurativa   . History of kidney stones   . History of thrush   . HIV DISEASE 07/24/2006   dx March 05  . Hyperlipidemia   . HYPERTENSION 07/24/2006  . Hyperthyroidism 08/2006   Grave's Disease -diffuse radiotracer uptake 08/25/06 Thyroid scan-Cold nodule to R lower lobe  of thyrorid  . Menometrorrhagia    hx of  . Nephrolithiasis   . 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 Personal history of chemotherapy    2020  . Personal history of radiation therapy    2020  . Pneumonia 2005  . 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 Sarcoidosis 02/08/2007   dx as a teenager in [place] from abnl CXR. Completed 2 yrs Prednisone after lung bx confirmation. No symptoms since then.  . Suppurative hidradenitis   . Thyroid cancer ([place])   . THYROID NODULE, RIGHT 02/08/2007    Social History   Tobacco Use  . Smoking status: Former Smoker    Packs/day: 0.50    Years: 15.00    Pack years: 7.50    Types: Cigarettes    Start date: 04/12/2017  . Smokeless tobacco: Never Used  . Tobacco comment: quit smoking  may 2018  Substance Use Topics  . Alcohol use: Yes    Alcohol/week: 0.0 standard drinks    Comment: social  . Drug use: No    Family History  Problem Relation Age of Onset  . Hypertension Mother   . Cancer Mother        laryngeal  . Heart disease Mother        stent  . Hypertension Father   . Lung cancer Father 70       hx smoking  . Heart disease Other   . Hypertension Other   . Stroke Other        Grandparent  . Kidney disease Other        Grandparent  . Diabetes Other        FH of Diabetes  . Hypertension Sister   . Cancer Maternal Uncle        Lung CA    . Hypertension Brother   . Hypertension Sister   . Breast cancer Maternal Aunt 65  . Breast cancer Paternal Aunt 53  . Prostate cancer Paternal Uncle   . Breast cancer Maternal Aunt        dx 60+  . Breast cancer Paternal Aunt        dx 54's  . Breast cancer Paternal Aunt        dx 69's  . Prostate cancer Paternal Uncle   . Lung cancer Paternal Uncle   . Breast cancer Cousin 75  . Breast cancer Cousin        dx <50  . Breast cancer Cousin        dx <50  . Breast cancer Cousin        dx <50  . Colon cancer Neg Hx   . Esophageal cancer Neg Hx   . Rectal cancer Neg Hx   . Stomach cancer Neg Hx     Allergies  Allergen Reactions  . Genvoya [Elviteg-Cobic-Emtricit-Tenofaf] Hives  . Lisinopril Cough  . Valsartan Other (See Comments) and Cough    Pt states she tolerates medicine now    Health Maintenance  Topic Date Due  . PAP SMEAR-Modifier  01/28/2014  . OPHTHALMOLOGY EXAM  07/15/2016  . FOOT EXAM  08/25/2019  . TETANUS/TDAP  10/18/2019  . HEMOGLOBIN A1C  12/06/2019  . MAMMOGRAM  06/05/2021  . COLONOSCOPY  11/16/2028  . INFLUENZA VACCINE  Completed  . PNEUMOCOCCAL POLYSACCHARIDE VACCINE AGE 14-64 HIGH RISK  Completed  . HIV Screening  Completed    Objective:  Vitals:   12/10/19 1111  BP: 115/80  Pulse: 80  Temp: 98.7 F (37.1 C)  Weight: 237 lb (107.5 kg)   Body mass index is 39.44 kg/m.  Physical Exam Constitutional:      Comments: Her weight is unchanged.  She is tearful during the exam.  Cardiovascular:     Rate and Rhythm: Normal rate and regular rhythm.     Heart sounds: No murmur.  Pulmonary:     Effort: Pulmonary effort is normal.     Breath sounds: Normal breath sounds.  Chest:    Abdominal:     Palpations: Abdomen is soft.     Tenderness: There is no abdominal tenderness. There is no right CVA tenderness or left CVA tenderness.  Musculoskeletal:        General: No swelling or tenderness.  Skin:    Findings: No rash.     Lab  Results Lab Results  Component Value Date   WBC 6.5 11/04/2019   HGB 10.7 (L) 11/04/2019   HCT 32.3 (L) 11/04/2019   MCV 92.8 11/04/2019   PLT 84 (L) 11/04/2019    Lab Results  Component Value Date   CREATININE 2.10 (H) 11/04/2019   BUN 31 (H) 11/04/2019   NA 138 11/04/2019   K 3.3 (L) 11/04/2019   CL 100 11/04/2019   CO2 28 11/04/2019    Lab Results  Component Value Date   ALT 35 11/04/2019   AST 56 (H) 11/04/2019   ALKPHOS 104 11/04/2019   BILITOT <0.2 (L) 11/04/2019    Lab Results  Component Value Date   CHOL 217 (H) 11/20/2019   HDL 40 (L) 11/20/2019   LDLCALC 141 (H) 11/20/2019   LDLDIRECT 171.0 06/05/2019   TRIG 221 (H) 11/20/2019   CHOLHDL 5.4 (H) 11/20/2019   Lab Results  Component Value Date   LABRPR NON-REACTIVE 10/23/2017   HIV 1 RNA Quant (copies/mL)  Date Value  11/20/2019 <20 DETECTED (A)  09/27/2018 <20 DETECTED (A)  07/11/2018 <20 DETECTED (A)   CD4 T Cell Abs (/uL)  Date Value  11/20/2019 414  07/12/2019 778  09/27/2018 1,110     Problem List Items Addressed This Visit      Medium   HIV disease ([place])    Despite being off her Descovy and [name] for 2 weeks in December her infection remains under excellent control.  She will continue her current medications.      Depression    She has multifactorial worsening of her chronic depression.  Glad that she has already set up a visit with our behavioral health counselor.  I will see her back in 2 weeks.        Unprioritized   Recurrent boils    I will restart her on chronic suppressive doxycycline.      Neuropathy due to chemotherapeutic drug [facility])    It appears that she has developed some neuropathy in her hands following her chemotherapy.      Hematuria    I will check a urinalysis and urine culture today.      Relevant Orders   Urinalysis, Routine w reflex microscopic   Urine Culture        Michel Bickers, MD [facility] for [place] [phone] pager   [phone] cell 12/10/2019, 12:08 PM

## 2019-12-10 NOTE — Assessment & Plan Note (Signed)
I will restart her on chronic suppressive doxycycline.

## 2019-12-10 NOTE — Assessment & Plan Note (Signed)
Despite being off her Descovy and [name] for 2 weeks in December her infection remains under excellent control.  She will continue her current medications.

## 2019-12-10 NOTE — Assessment & Plan Note (Signed)
She has multifactorial worsening of her chronic depression.  Glad that she has already set up a visit with our behavioral health counselor.  I will see her back in 2 weeks.

## 2019-12-10 NOTE — Assessment & Plan Note (Signed)
It appears that she has developed some neuropathy in her hands following her chemotherapy.

## 2019-12-10 NOTE — Assessment & Plan Note (Signed)
I will check a urinalysis and urine culture today.

## 2019-12-11 ENCOUNTER — Telehealth: Payer: Self-pay | Admitting: Oncology

## 2019-12-11 LAB — URINALYSIS, ROUTINE W REFLEX MICROSCOPIC
Bacteria, UA: NONE SEEN /HPF
Bilirubin Urine: NEGATIVE
Glucose, UA: NEGATIVE
Hyaline Cast: NONE SEEN /LPF
Ketones, ur: NEGATIVE
Nitrite: POSITIVE — AB
Specific Gravity, Urine: 1.017 (ref 1.001–1.03)
WBC, UA: >=60 /HPF — AB (ref 0–5)
pH: <=5 (ref 5.0–8.0)

## 2019-12-11 LAB — URINE CULTURE
MICRO NUMBER:: 10180315
SPECIMEN QUALITY:: ADEQUATE

## 2019-12-11 NOTE — Telephone Encounter (Signed)
FAXED RECORDS TO [facility] 

## 2019-12-13 ENCOUNTER — Other Ambulatory Visit: Payer: Self-pay

## 2019-12-13 DIAGNOSIS — L0293 Carbuncle, unspecified: Secondary | ICD-10-CM

## 2019-12-13 MED ORDER — DOXYCYCLINE HYCLATE 100 MG PO TABS: 100.0000 mg | ORAL_TABLET | Freq: Two times a day (BID) | ORAL | 11 refills | Status: DC

## 2019-12-15 ENCOUNTER — Other Ambulatory Visit: Payer: Self-pay | Admitting: Primary Care

## 2019-12-15 DIAGNOSIS — I10 Essential (primary) hypertension: Secondary | ICD-10-CM

## 2019-12-19 ENCOUNTER — Ambulatory Visit: Payer: Self-pay

## 2019-12-20 ENCOUNTER — Ambulatory Visit: Payer: Self-pay

## 2019-12-23 ENCOUNTER — Telehealth: Payer: Self-pay

## 2019-12-23 NOTE — Telephone Encounter (Signed)
COVID-19 Pre-Screening Questions:12/23/19  Do you currently have a fever (>100 F), chills or unexplained body aches?NO  Are you currently experiencing new cough, shortness of breath, sore throat, runny nose?NO .  . Have you recently travelled outside the state of [place] in the last 14 days?NO .  Have you been in contact with someone that is currently pending confirmation of Covid19 testing or has been confirmed to have the Covid19 virus?  NO   **If the patient answers NO to ALL questions -  advise the patient to please call the clinic before coming to the office should any symptoms develop.     

## 2019-12-24 ENCOUNTER — Other Ambulatory Visit: Payer: Self-pay

## 2019-12-24 ENCOUNTER — Ambulatory Visit (INDEPENDENT_AMBULATORY_CARE_PROVIDER_SITE_OTHER): Payer: Self-pay | Admitting: Internal Medicine

## 2019-12-24 ENCOUNTER — Encounter: Payer: Self-pay | Admitting: Internal Medicine

## 2019-12-24 DIAGNOSIS — B20 Human immunodeficiency virus [HIV] disease: Secondary | ICD-10-CM

## 2019-12-24 DIAGNOSIS — F331 Major depressive disorder, recurrent, moderate: Secondary | ICD-10-CM

## 2019-12-24 DIAGNOSIS — N200 Calculus of kidney: Secondary | ICD-10-CM

## 2019-12-24 DIAGNOSIS — L732 Hidradenitis suppurativa: Secondary | ICD-10-CM

## 2019-12-24 DIAGNOSIS — Z95828 Presence of other vascular implants and grafts: Secondary | ICD-10-CM

## 2019-12-24 NOTE — Assessment & Plan Note (Signed)
I encouraged her to get out and do some walking and get fresh air on a daily basis.  She is scheduled to see our behavioral health counselor, Jessica Priest, on 01/02/2020.

## 2019-12-24 NOTE — Assessment & Plan Note (Signed)
Her infection remains under very good long-term control.  She will continue Descovy and [name] and follow-up in 2 months.

## 2019-12-24 NOTE — Assessment & Plan Note (Signed)
She will have her Port-A-Cath removed when she has health insurance reinstated.

## 2019-12-24 NOTE — Assessment & Plan Note (Signed)
I suspect that her recent asymptomatic hematuria is due to renal calculi.

## 2019-12-24 NOTE — Assessment & Plan Note (Signed)
She has not had any new boils recently.  I did recommend that she start back on doxycycline.

## 2019-12-24 NOTE — Progress Notes (Signed)
Patient Active Problem List   Diagnosis Date Noted  . Malignant neoplasm of lower-inner quadrant of right breast of female, estrogen receptor positive ([place]) 05/10/2018    Priority: Medium  . Hidradenitis suppurativa of right >> left axilla 01/30/2012    Priority: Medium  . 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 Cigarette smoker 02/08/2007    Priority: Medium  . HIV disease ([place]) 07/24/2006    Priority: Medium  . Depression 07/24/2006    Priority: Medium  . Essential hypertension 07/24/2006    Priority: Medium  . Asthma 07/24/2006    Priority: Medium  . Hematuria 12/10/2019  . Neuropathy due to chemotherapeutic drug ([place]) 12/10/2019  . Dysphagia 07/23/2019  . CKD stage 3 due to type 2 diabetes mellitus ([place]) 07/12/2019  . Normocytic anemia 07/12/2019  . Preventative health care 09/10/2018  . Genetic testing 07/25/2018  . Port-A-Cath in place 07/12/2018  . Family history of breast cancer   . Family history of prostate cancer   . Family history of lung cancer   . Hyperlipidemia 04/26/2017  . Chronic anxiety 04/11/2017  . Upper airway cough syndrome 11/11/2016  . Morbid (severe) obesity due to excess calories ([place]) 10/14/2016  . Allergic rhinitis 02/18/2016  . Non-insulin dependent type 2 diabetes mellitus ([place]) 06/23/2015  . Renal calculi 10/29/2014  . Recurrent boils 06/11/2014  . Sarcoidosis 02/08/2007  . GERD 07/24/2006    Patient's Medications  New Prescriptions   No medications on file  Previous Medications   AMLODIPINE (NORVASC) 10 MG TABLET    TAKE 1 TABLET BY MOUTH EVERY DAY FOR BLOOD PRESSURE   BLOOD GLUCOSE MONITORING SUPPL (ONETOUCH VERIO) W/DEVICE KIT    USE AS INSTRUCTED TO TEST BLOOD SUGAR UP TO 4 TIMES DAILY E11.9   DAPAGLIFLOZIN PROPANEDIOL (FARXIGA) 10 MG TABS TABLET    Take 10 mg by mouth daily before breakfast.   DOLUTEGRAVIR ([name]) 50 MG TABLET    Take 1 tablet (50 mg total) by mouth daily.   DOXYCYCLINE  (VIBRA-TABS) 100 MG TABLET    Take 1 tablet (100 mg total) by mouth 2 (two) times daily.   EMTRICITABINE-TENOFOVIR AF (DESCOVY) 200-25 MG TABLET    Take 1 tablet by mouth daily.   FLOVENT HFA 110 MCG/ACT INHALER    TAKE 1 PUFF BY MOUTH TWICE A DAY   GLIPIZIDE (GLUCOTROL) 5 MG TABLET    TAKE 1 TABLET (5 MG TOTAL) BY MOUTH 2 (TWO) TIMES DAILY BEFORE A MEAL. FOR DIABETES.   GLUCOSE BLOOD (ONETOUCH VERIO) TEST STRIP    USE AS INSTRUCTED TO TEST BLOOD SUGAR DAILY   IRBESARTAN-HYDROCHLOROTHIAZIDE (AVALIDE) 150-12.5 MG TABLET    TAKE 1 TABLET BY MOUTH EVERY DAY FOR BLOOD PRESSURE   LANCETS (ONETOUCH ULTRASOFT) LANCETS    Use as instructed to test blood sugar daily   LEVOTHYROXINE (SYNTHROID) 175 MCG TABLET    TAKE 1 TABLET BY MOUTH EVERY MORNING ON AN EMPTY STOMACH WITH A FULL GLASS OF WATER.   MAGIC MOUTHWASH SOLN    Take 5 mLs by mouth 3 (three) times daily as needed for mouth pain.   METFORMIN (GLUCOPHAGE) 500 MG TABLET       METOPROLOL SUCCINATE (TOPROL-XL) 50 MG 24 HR TABLET    TAKE 1 TABLET (50 MG TOTAL) BY MOUTH DAILY. TAKE WITH OR IMMEDIATELY FOLLOWING A MEAL.   NYSTATIN (MYCOSTATIN) 100000 UNIT/ML SUSPENSION       OMEPRAZOLE (PRILOSEC) 40 MG CAPSULE  TAKE 1 CAPSULE BY MOUTH EVERY DAY   ONDANSETRON (ZOFRAN) 8 MG TABLET    Take 1 tablet (8 mg total) by mouth every 8 (eight) hours as needed for nausea or vomiting.   POTASSIUM CHLORIDE SA (KLOR-CON) 20 MEQ TABLET    Take 20 mEq by mouth daily.   ROSUVASTATIN (CRESTOR) 10 MG TABLET    TAKE 1 TABLET BY MOUTH EVERY DAY FOR CHOLESTEROL   SPIRONOLACTONE (ALDACTONE) 25 MG TABLET    Take 25 mg by mouth daily.   TAMOXIFEN (NOLVADEX) 20 MG TABLET    Take 1 tablet (20 mg total) by mouth daily.  Modified Medications   No medications on file  Discontinued Medications   No medications on file    Subjective: [name] is in for her routine HIV follow-up visit.  She has not had any problems obtaining her Descovy or [name].  She has not been missing doses.   She is still struggling with anxiety and depression after losing her job last month at the tail end of her chemotherapy for breast cancer.  She is working to get new insurance through [employer]  She has postponed having her Port-A-Cath removed until she has insurance.  Yesterday she went to the grocery store for first time in over 1 year. She became very short of breath and anxious and had to leave.  She has not had any new boils.  She has been off of her doxycycline but plans on picking it up today.  No hematuria stopped 3 days ago.  She is still bothered by numbness at the tips of her fingers.  She says that she cut her left fourth finger recently and did not even know it until her daughter pointed out the bleeding.  Review of Systems: Review of Systems  Constitutional: Positive for malaise/fatigue. Negative for chills, fever and weight loss.  Respiratory: Positive for shortness of breath. Negative for cough and sputum production.   Cardiovascular: Negative for chest pain.  Gastrointestinal: Negative for abdominal pain, diarrhea, nausea and vomiting.  Genitourinary: Negative for dysuria and hematuria.  Skin: Negative for rash.  Neurological: Positive for sensory change.  Psychiatric/Behavioral: Positive for depression. The patient is nervous/anxious.     Past Medical History:  Diagnosis Date  . Allergy   . Anemia    Normocytic  . Anxiety   . Asthma   . Blood dyscrasia   . Bronchitis 2005  . 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 Diabetes mellitus without complication ([place])   . Family history of breast cancer   . Family history of lung cancer   . Family history of prostate cancer   . Gastroenteritis 07/10/07  . GERD 07/24/2006  . GRAVE'S DISEASE 01/01/2008  . History of hidradenitis suppurativa   . History of kidney stones   . History of thrush   . HIV DISEASE 07/24/2006   dx March 05  . Hyperlipidemia   . HYPERTENSION 07/24/2006  . Hyperthyroidism  08/2006   Grave's Disease -diffuse radiotracer uptake 08/25/06 Thyroid scan-Cold nodule to R lower lobe of thyrorid  . Menometrorrhagia    hx of  . Nephrolithiasis   . 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 Personal history of chemotherapy    2020  . Personal history of radiation therapy    2020  . Pneumonia 2005  . 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 Sarcoidosis 02/08/2007   dx as a teenager in [place] from abnl CXR. Completed 2  yrs Prednisone after lung bx confirmation. No symptoms since then.  . Suppurative hidradenitis   . Thyroid cancer ([place])   . THYROID NODULE, RIGHT 02/08/2007    Social History   Tobacco Use  . Smoking status: Former Smoker    Packs/day: 0.50    Years: 15.00    Pack years: 7.50    Types: Cigarettes    Start date: 04/12/2017  . Smokeless tobacco: Never Used  . Tobacco comment: quit smoking  may 2018  Substance Use Topics  . Alcohol use: Yes    Alcohol/week: 0.0 standard drinks    Comment: social  . Drug use: No    Family History  Problem Relation Age of Onset  . Hypertension Mother   . Cancer Mother        laryngeal  . Heart disease Mother        stent  . Hypertension Father   . Lung cancer Father 64       hx smoking  . Heart disease Other   . Hypertension Other   . Stroke Other        Grandparent  . Kidney disease Other        Grandparent  . Diabetes Other        FH of Diabetes  . Hypertension Sister   . Cancer Maternal Uncle        Lung CA  . Hypertension Brother   . Hypertension Sister   . Breast cancer Maternal Aunt 65  . Breast cancer Paternal Aunt 78  . Prostate cancer Paternal Uncle   . Breast cancer Maternal Aunt        dx 60+  . Breast cancer Paternal Aunt        dx 32's  . Breast cancer Paternal Aunt        dx 72's  . Prostate cancer Paternal Uncle   . Lung cancer Paternal Uncle   . Breast cancer Cousin 22  . Breast cancer Cousin        dx <50    . Breast cancer Cousin        dx <50  . Breast cancer Cousin        dx <50  . Colon cancer Neg Hx   . Esophageal cancer Neg Hx   . Rectal cancer Neg Hx   . Stomach cancer Neg Hx     Allergies  Allergen Reactions  . Genvoya [Elviteg-Cobic-Emtricit-Tenofaf] Hives  . Lisinopril Cough  . Valsartan Other (See Comments) and Cough    Pt states she tolerates medicine now    Health Maintenance  Topic Date Due  . PAP SMEAR-Modifier  01/28/2014  . OPHTHALMOLOGY EXAM  07/15/2016  . FOOT EXAM  08/25/2019  . TETANUS/TDAP  10/18/2019  . HEMOGLOBIN A1C  12/06/2019  . MAMMOGRAM  06/05/2021  . COLONOSCOPY  11/16/2028  . INFLUENZA VACCINE  Completed  . PNEUMOCOCCAL POLYSACCHARIDE VACCINE AGE 76-64 HIGH RISK  Completed  . HIV Screening  Completed    Objective:  Vitals:   12/24/19 1053  BP: 139/79  Pulse: 84  Temp: 98.5 F (36.9 C)  TempSrc: Oral  Weight: 234 lb (106.1 kg)   Body mass index is 38.94 kg/m.  Physical Exam Constitutional:      Comments: She is in better spirits today.  Cardiovascular:     Rate and Rhythm: Normal rate and regular rhythm.     Heart sounds: No murmur.  Pulmonary:     Effort: Pulmonary effort is normal.  Breath sounds: Normal breath sounds.  Abdominal:     Palpations: Abdomen is soft.     Tenderness: There is no abdominal tenderness.  Psychiatric:        Mood and Affect: Mood normal.     Lab Results Lab Results  Component Value Date   WBC 6.5 11/04/2019   HGB 10.7 (L) 11/04/2019   HCT 32.3 (L) 11/04/2019   MCV 92.8 11/04/2019   PLT 84 (L) 11/04/2019    Lab Results  Component Value Date   CREATININE 2.10 (H) 11/04/2019   BUN 31 (H) 11/04/2019   NA 138 11/04/2019   K 3.3 (L) 11/04/2019   CL 100 11/04/2019   CO2 28 11/04/2019    Lab Results  Component Value Date   ALT 35 11/04/2019   AST 56 (H) 11/04/2019   ALKPHOS 104 11/04/2019   BILITOT <0.2 (L) 11/04/2019    Lab Results  Component Value Date   CHOL 217 (H) 11/20/2019    HDL 40 (L) 11/20/2019   LDLCALC 141 (H) 11/20/2019   LDLDIRECT 171.0 06/05/2019   TRIG 221 (H) 11/20/2019   CHOLHDL 5.4 (H) 11/20/2019   Lab Results  Component Value Date   LABRPR NON-REACTIVE 10/23/2017   HIV 1 RNA Quant (copies/mL)  Date Value  11/20/2019 <20 DETECTED (A)  09/27/2018 <20 DETECTED (A)  07/11/2018 <20 DETECTED (A)   CD4 T Cell Abs (/uL)  Date Value  11/20/2019 414  07/12/2019 778  09/27/2018 1,110     Problem List Items Addressed This Visit      Medium   HIV disease ([place])    Her infection remains under very good long-term control.  She will continue Descovy and [name] and follow-up in 2 months.      Hidradenitis suppurativa of right >> left axilla    She has not had any new boils recently.  I did recommend that she start back on doxycycline.      Depression    I encouraged her to get out and do some walking and get fresh air on a daily basis.  She is scheduled to see our behavioral health counselor, Jessica Priest, on 01/02/2020.        Unprioritized   Renal calculi    I suspect that her recent asymptomatic hematuria is due to renal calculi.      Port-A-Cath in place    She will have her Port-A-Cath removed when she has health insurance reinstated.           Michel Bickers, MD [facility] [phone] pager   [phone] cell 12/24/2019, 11:29 AM

## 2019-12-26 ENCOUNTER — Ambulatory Visit: Payer: Self-pay | Attending: Internal Medicine

## 2019-12-26 DIAGNOSIS — Z23 Encounter for immunization: Secondary | ICD-10-CM

## 2019-12-26 NOTE — Progress Notes (Signed)
   Covid-19 Vaccination Clinic  Name:  Taneasha Fuqua    MRN: [id number] DOB: Sep 17, 1966  12/26/2019  Ms. Gorgas was observed post Covid-19 immunization for 15 minutes without incident. She was provided with Vaccine Information Sheet and instruction to access the V-Safe system.   Ms. Behrens was instructed to call 911 with any severe reactions post vaccine: Marland Kitchen Difficulty breathing  . Swelling of face and throat  . A fast heartbeat  . A bad rash all over body  . Dizziness and weakness   Immunizations Administered    Name Date Dose VIS Date Route   Pfizer COVID-19 Vaccine 12/26/2019 11:13 AM 0.3 mL 09/27/2019 Intramuscular   Manufacturer: [place]   Lot: [id number]   [place]: [id number]

## 2020-01-01 ENCOUNTER — Encounter: Payer: Self-pay | Admitting: Internal Medicine

## 2020-01-02 ENCOUNTER — Ambulatory Visit: Payer: Self-pay

## 2020-01-07 ENCOUNTER — Telehealth (HOSPITAL_COMMUNITY): Payer: Self-pay

## 2020-01-07 NOTE — Telephone Encounter (Signed)
Received request from [employer] [place] requesting medical records for timeframe 10/18/19 to present for payment of benefits.  Pt however last seen 10/20.  Fax sent with this information informing them there are no records since October/2020.

## 2020-01-08 ENCOUNTER — Other Ambulatory Visit: Payer: Self-pay | Admitting: Primary Care

## 2020-01-11 ENCOUNTER — Telehealth: Payer: Self-pay | Admitting: Infectious Disease

## 2020-01-11 ENCOUNTER — Encounter: Payer: Self-pay | Admitting: Infectious Disease

## 2020-01-11 MED ORDER — PREDNISONE 20 MG PO TABS: 40.0000 mg | ORAL_TABLET | Freq: Every day | ORAL | 1 refills | Status: AC

## 2020-01-11 NOTE — Telephone Encounter (Signed)
Patient having gout flare  I will call in prednisone which will be covered by [employer] I believe x 7 days  She has [employer] plan going active April 1st and would recommend start colchicine when that kicks in  She does have DM and may see short terms worsening of hyperglycemia  She is also on thiazide diuretic which could be at play in flare  Can we check back in with her later this week?

## 2020-01-13 ENCOUNTER — Other Ambulatory Visit: Payer: Self-pay | Admitting: Primary Care

## 2020-01-13 DIAGNOSIS — E890 Postprocedural hypothyroidism: Secondary | ICD-10-CM

## 2020-01-13 NOTE — Telephone Encounter (Addendum)
Noted and appreciate the update. Patient frequently does not follow through with recommended follow up, especially in regards to thyroid disorder.   [name], please have her scheduled with me for follow up of diabetes and thyroid disorder. Try to schedule her at the end of a session.

## 2020-01-13 NOTE — Telephone Encounter (Signed)
No problem. Its unfortunate a lof of our B20 patients will take their antiretrovirals and followup with [place] but because of stigma other reasons wont see primary care as much as they need to. Too many patients like this. Gotta fix it

## 2020-01-14 NOTE — Telephone Encounter (Signed)
Spoken to patient yesterday and today. Was able to schedule appointment on 01/27/2020

## 2020-01-16 ENCOUNTER — Ambulatory Visit: Payer: Self-pay

## 2020-01-20 ENCOUNTER — Ambulatory Visit: Payer: Self-pay | Attending: Internal Medicine

## 2020-01-20 DIAGNOSIS — Z23 Encounter for immunization: Secondary | ICD-10-CM

## 2020-01-20 NOTE — Progress Notes (Signed)
   Covid-19 Vaccination Clinic  Name:  Velera [place]    MRN: [id number] DOB: Apr 01, 1966  01/20/2020  Ms. Cortinas was observed post Covid-19 immunization for 15 minutes without incident. She was provided with Vaccine Information Sheet and instruction to access the V-Safe system.   Ms. Piazza was instructed to call 911 with any severe reactions post vaccine: Marland Kitchen Difficulty breathing  . Swelling of face and throat  . A fast heartbeat  . A bad rash all over body  . Dizziness and weakness   Immunizations Administered    Name Date Dose VIS Date Route   Pfizer COVID-19 Vaccine 01/20/2020 11:57 AM 0.3 mL 09/27/2019 Intramuscular   Manufacturer: [place]   Lot: [id number]   [place]: [id number]

## 2020-01-21 ENCOUNTER — Ambulatory Visit: Payer: Self-pay | Admitting: Primary Care

## 2020-01-23 ENCOUNTER — Other Ambulatory Visit: Payer: Self-pay

## 2020-01-27 ENCOUNTER — Encounter: Payer: Self-pay | Admitting: Primary Care

## 2020-01-27 ENCOUNTER — Other Ambulatory Visit: Payer: Self-pay

## 2020-01-27 ENCOUNTER — Ambulatory Visit (INDEPENDENT_AMBULATORY_CARE_PROVIDER_SITE_OTHER): Payer: 59 | Admitting: Primary Care

## 2020-01-27 ENCOUNTER — Other Ambulatory Visit: Payer: Self-pay | Admitting: Primary Care

## 2020-01-27 VITALS — BP 126/86 | HR 66 | Temp 96.3°F | Ht 65.0 in | Wt 238.2 lb

## 2020-01-27 DIAGNOSIS — Z170 Estrogen receptor positive status [ER+]: Secondary | ICD-10-CM

## 2020-01-27 DIAGNOSIS — N183 Chronic kidney disease, stage 3 unspecified: Secondary | ICD-10-CM

## 2020-01-27 DIAGNOSIS — T451X5A Adverse effect of antineoplastic and immunosuppressive drugs, initial encounter: Secondary | ICD-10-CM

## 2020-01-27 DIAGNOSIS — I10 Essential (primary) hypertension: Secondary | ICD-10-CM | POA: Diagnosis not present

## 2020-01-27 DIAGNOSIS — G620 Drug-induced polyneuropathy: Secondary | ICD-10-CM

## 2020-01-27 DIAGNOSIS — E785 Hyperlipidemia, unspecified: Secondary | ICD-10-CM

## 2020-01-27 DIAGNOSIS — L732 Hidradenitis suppurativa: Secondary | ICD-10-CM

## 2020-01-27 DIAGNOSIS — C50311 Malignant neoplasm of lower-inner quadrant of right female breast: Secondary | ICD-10-CM

## 2020-01-27 DIAGNOSIS — E890 Postprocedural hypothyroidism: Secondary | ICD-10-CM

## 2020-01-27 DIAGNOSIS — E119 Type 2 diabetes mellitus without complications: Secondary | ICD-10-CM

## 2020-01-27 DIAGNOSIS — M109 Gout, unspecified: Secondary | ICD-10-CM | POA: Diagnosis not present

## 2020-01-27 DIAGNOSIS — L0293 Carbuncle, unspecified: Secondary | ICD-10-CM

## 2020-01-27 DIAGNOSIS — E1122 Type 2 diabetes mellitus with diabetic chronic kidney disease: Secondary | ICD-10-CM

## 2020-01-27 DIAGNOSIS — M1A9XX0 Chronic gout, unspecified, without tophus (tophi): Secondary | ICD-10-CM | POA: Insufficient documentation

## 2020-01-27 LAB — POCT GLYCOSYLATED HEMOGLOBIN (HGB A1C): Hemoglobin A1C: 8 % — AB (ref 4.0–5.6)

## 2020-01-27 MED ORDER — ROSUVASTATIN CALCIUM 10 MG PO TABS: ORAL_TABLET | ORAL | 3 refills | Status: DC

## 2020-01-27 MED ORDER — TRULICITY 0.75 MG/0.5ML ~~LOC~~ SOAJ: 0.7500 mg | SUBCUTANEOUS | 3 refills | Status: DC

## 2020-01-27 NOTE — Progress Notes (Signed)
Subjective:    Patient ID: [name], female    DOB: 27-Apr-1966, 54 y.o.   MRN: [id number]  HPI  This visit occurred during the SARS-CoV-2 public health emergency.  Safety protocols were in place, including screening questions prior to the visit, additional usage of staff PPE, and extensive cleaning of exam room while observing appropriate contact time as indicated for disinfecting solutions.   [name] is a 54 year old female with a history of HIV, CKD, type 2 diabetes, post surgical hypothyroidism, thyroid cancer, breast cancer, sarcoidosis, tobacco abuse who presents today for follow up.  Following with infectious disease, cardiology, oncology, nephrology.   She is compliant to her glipizide 5 mg but is only taking once in the evening due to daytime drops in glucose to 50-80. She is no longer on Metformin due to CKD.  She is taking her levothyroxine 175 mg every morning with water only. She mostly take this without her other medications but will sometimes eat and take levothyroxine with her other medications.   She doesn't think she takes amlodipine 10 mg, Farxiga 10 mg, or rosuvastatin. She takes potassium "sometimes".   She had a gout attack last week, severe pain and swelling to left metacarpal joint of great toe. She called into infectious disease emergency line and was treated with prednisone course. She's doing much better, still some pain. She's never been diagnosed with gout.   BP Readings from Last 3 Encounters:  01/27/20 126/86  12/24/19 139/79  12/10/19 115/80     Review of Systems  Eyes: Negative for visual disturbance.  Respiratory: Negative for shortness of breath.   Cardiovascular: Negative for chest pain.  Musculoskeletal: Positive for arthralgias.  Neurological: Negative for dizziness and headaches.       Past Medical History:  Diagnosis Date  . Allergy   . Anemia    Normocytic  . Anxiety   . Asthma   . Blood dyscrasia   . Bronchitis 2005   . 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 Diabetes mellitus without complication ([place])   . Family history of breast cancer   . Family history of lung cancer   . Family history of prostate cancer   . Gastroenteritis 07/10/07  . GERD 07/24/2006  . GRAVE'S DISEASE 01/01/2008  . History of hidradenitis suppurativa   . History of kidney stones   . History of thrush   . HIV DISEASE 07/24/2006   dx March 05  . Hyperlipidemia   . HYPERTENSION 07/24/2006  . Hyperthyroidism 08/2006   Grave's Disease -diffuse radiotracer uptake 08/25/06 Thyroid scan-Cold nodule to R lower lobe of thyrorid  . Menometrorrhagia    hx of  . Nephrolithiasis   . 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 Personal history of chemotherapy    2020  . Personal history of radiation therapy    2020  . Pneumonia 2005  . 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 Sarcoidosis 02/08/2007   dx as a teenager in [place] from abnl CXR. Completed 2 yrs Prednisone after lung bx confirmation. No symptoms since then.  . Suppurative hidradenitis   . Thyroid cancer ([place])   . THYROID NODULE, RIGHT 02/08/2007     Social History   Socioeconomic History  . Marital status: Single    Spouse name: Not on file  . Number of children: 1  . Years of education: Not on file  . Highest education level: Not on file  Occupational History  .  Occupation: Secondary school teacher  Tobacco Use  . Smoking status: Former Smoker    Packs/day: 0.50    Years: 15.00    Pack years: 7.50    Types: Cigarettes    Start date: 04/12/2017  . Smokeless tobacco: Never Used  . Tobacco comment: quit smoking  may 2018  Substance and Sexual Activity  . Alcohol use: Yes    Alcohol/week: 0.0 standard drinks    Comment: social  . Drug use: No  . Sexual activity: Not Currently    Birth control/protection: Post-menopausal    Comment: declined condoms  Other Topics Concern  . Not on file  Social  History Narrative  . Not on file   Social Determinants of Health   Financial Resource Strain:   . Difficulty of Paying Living Expenses:   Food Insecurity:   . Worried About Charity fundraiser in the Last Year:   . Arboriculturist in the Last Year:   Transportation Needs:   . Film/video editor (Medical):   Marland Kitchen Lack of Transportation (Non-Medical):   Physical Activity:   . Days of Exercise per Week:   . Minutes of Exercise per Session:   Stress:   . Feeling of Stress :   Social Connections:   . Frequency of Communication with Friends and Family:   . Frequency of Social Gatherings with Friends and Family:   . Attends Religious Services:   . Active Member of Clubs or Organizations:   . Attends Archivist Meetings:   Marland Kitchen Marital Status:   Intimate Partner Violence:   . Fear of Current or Ex-Partner:   . Emotionally Abused:   Marland Kitchen Physically Abused:   . Sexually Abused:     Past Surgical History:  Procedure Laterality Date  . BREAST EXCISIONAL BIOPSY Right 04/26/2018   right axilla negative  . BREAST EXCISIONAL BIOPSY Left 04/26/2018   left axilla negative  . BREAST LUMPECTOMY Right 10/03/2018   malignant  . BREAST LUMPECTOMY WITH RADIOACTIVE SEED AND SENTINEL LYMPH NODE BIOPSY Right 10/03/2018   Procedure: RIGHT BREAST LUMPECTOMY WITH RADIOACTIVE SEED AND SENTINEL LYMPH NODE MAPPING;  Surgeon: Erroll Luna, MD;  Location: [facility];  Service: General;  Laterality: Right;  . BREAST SURGERY  1997   Breast Reduction   . CYSTOSCOPY W/ URETERAL STENT REMOVAL  11/09/2012   Procedure: CYSTOSCOPY WITH STENT REMOVAL;  Surgeon: Alexis Frock, MD;  Location: [facility];  Service: Urology;  Laterality: Right;  . CYSTOSCOPY WITH RETROGRADE PYELOGRAM, URETEROSCOPY AND STENT PLACEMENT  11/09/2012   Procedure: CYSTOSCOPY WITH RETROGRADE PYELOGRAM, URETEROSCOPY AND STENT PLACEMENT;  Surgeon: Alexis Frock, MD;  Location: [facility];  Service: Urology;  Laterality: Left;  LEFT URETEROSCOPY,  STONE MANIPULATION, left STENT exchange   . CYSTOSCOPY WITH STENT PLACEMENT  10/02/2012   Procedure: CYSTOSCOPY WITH STENT PLACEMENT;  Surgeon: Alexis Frock, MD;  Location: [facility];  Service: Urology;  Laterality: Left;  . DILATION AND CURETTAGE OF UTERUS  Feb 2004   s/p for 1st trimester nonviable pregnancy  . EYE SURGERY     sty under eyelid  . INCISE AND DRAIN ABCESS  Nov 03   s/p I &D for righ inframmary fold hidradenitis  . INCISION AND DRAINAGE PERITONSILLAR ABSCESS  Mar 03  . IR CV LINE INJECTION  06/07/2018  . IR IMAGING GUIDED PORT INSERTION  06/20/2018  . IR REMOVAL TUN ACCESS W/ PORT W/O FL MOD SED  06/20/2018  . IRRIGATION AND DEBRIDEMENT ABSCESS  01/31/2012  Procedure: IRRIGATION AND DEBRIDEMENT ABSCESS;  Surgeon: Shann Medal, MD;  Location: [facility];  Service: General;  Laterality: Right;  right breast and axilla   . NEPHROLITHOTOMY  10/02/2012   Procedure: NEPHROLITHOTOMY PERCUTANEOUS;  Surgeon: Alexis Frock, MD;  Location: [facility];  Service: Urology;  Laterality: Right;  First Stage Percutaneous Nephrolithotomy with Surgeon Access, Left Ureteral Stent    . NEPHROLITHOTOMY  10/04/2012   Procedure: NEPHROLITHOTOMY PERCUTANEOUS SECOND LOOK;  Surgeon: Alexis Frock, MD;  Location[facility];  Service: Urology;  Laterality: Right;     . NEPHROLITHOTOMY  10/08/2012   Procedure: NEPHROLITHOTOMY PERCUTANEOUS;  Surgeon: Alexis Frock, MD;  Location[facility];  Service: Urology;  Laterality: Right;  THIRD STAGE, nephrostomy tube exchange x 2  . NEPHROLITHOTOMY  10/11/2012   Procedure: NEPHROLITHOTOMY PERCUTANEOUS SECOND LOOK;  Surgeon: Alexis Frock, MD;  Location[facility];  Service: Urology;  Laterality: Right;  RIGHT 4 STAGE PERCUTANOUS NEPHROLITHOTOMY, right URETEROSCOPY WITH HOLMIUM LASER   . PORTACATH PLACEMENT Left 05/17/2018   Procedure: INSERTION PORT-A-CATH;  Surgeon: Coralie Keens, MD;  Location: [facility];  Service: General;  Laterality: Left;  . RADICAL NECK  DISSECTION  04/12/2017   limited/notes 04/12/2017  . RADICAL NECK DISSECTION N/A 04/12/2017   Procedure: RADICAL NECK DISSECTION;  Surgeon: Melida Quitter, MD;  Location: [facility];  Service: ENT;  Laterality: N/A;  limited neck dissection 2 hours total  . REDUCTION MAMMAPLASTY Bilateral 1998  . [place]  1994  . THYROIDECTOMY  04/12/2017   completion/notes 04/12/2017  . THYROIDECTOMY N/A 04/12/2017   Procedure: THYROIDECTOMY;  Surgeon: Melida Quitter, MD;  Location: [facility];  Service: ENT;  Laterality: N/A;  Completion Thyroidectomy  . TOTAL THYROIDECTOMY  2010    Family History  Problem Relation Age of Onset  . Hypertension Mother   . Cancer Mother        laryngeal  . Heart disease Mother        stent  . Hypertension Father   . Lung cancer Father 71       hx smoking  . Heart disease Other   . Hypertension Other   . Stroke Other        Grandparent  . Kidney disease Other        Grandparent  . Diabetes Other        FH of Diabetes  . Hypertension Sister   . Cancer Maternal Uncle        Lung CA  . Hypertension Brother   . Hypertension Sister   . Breast cancer Maternal Aunt 65  . Breast cancer Paternal Aunt 22  . Prostate cancer Paternal Uncle   . Breast cancer Maternal Aunt        dx 60+  . Breast cancer Paternal Aunt        dx 50's  . Breast cancer Paternal Aunt        dx 38's  . Prostate cancer Paternal Uncle   . Lung cancer Paternal Uncle   . Breast cancer Cousin 53  . Breast cancer Cousin        dx <50  . Breast cancer Cousin        dx <50  . Breast cancer Cousin        dx <50  . Colon cancer Neg Hx   . Esophageal cancer Neg Hx   . Rectal cancer Neg Hx   . Stomach cancer Neg Hx     Allergies  Allergen Reactions  . Kauitl.[place] [  Elviteg-Cobic-Emtricit-Tenofaf] Hives  . Lisinopril Cough  . Valsartan Other (See Comments) and Cough    Pt states she tolerates medicine now    Current Outpatient Medications on File Prior to Visit  Medication Sig Dispense Refill  . Blood  Glucose Monitoring Suppl (ONETOUCH VERIO) w/Device KIT USE AS INSTRUCTED TO TEST BLOOD SUGAR UP TO 4 TIMES DAILY E11.9 1 kit 0  . dolutegravir ([name]) 50 MG tablet Take 1 tablet (50 mg total) by mouth daily. 30 tablet 2  . doxycycline (VIBRA-TABS) 100 MG tablet Take 1 tablet (100 mg total) by mouth 2 (two) times daily. 60 tablet 11  . emtricitabine-tenofovir AF (DESCOVY) 200-25 MG tablet Take 1 tablet by mouth daily. 30 tablet 2  . FLOVENT HFA 110 MCG/ACT inhaler TAKE 1 PUFF BY MOUTH TWICE A DAY 36 Inhaler 1  . glipiZIDE (GLUCOTROL) 5 MG tablet TAKE 1 TABLET (5 MG TOTAL) BY MOUTH 2 (TWO) TIMES DAILY BEFORE A MEAL. FOR DIABETES. 180 tablet 1  . glucose blood (ONETOUCH VERIO) test strip USE AS INSTRUCTED TO TEST BLOOD SUGAR DAILY 100 each 2  . irbesartan-hydrochlorothiazide (AVALIDE) 150-12.5 MG tablet TAKE 1 TABLET BY MOUTH EVERY DAY FOR BLOOD PRESSURE 90 tablet 0  . Lancets (ONETOUCH ULTRASOFT) lancets Use as instructed to test blood sugar daily 100 each 5  . levothyroxine (SYNTHROID) 175 MCG tablet TAKE 1 TABLET BY MOUTH EVERY MORNING ON AN EMPTY STOMACH WITH A FULL GLASS OF WATER. 30 tablet 0  . magic mouthwash SOLN Take 5 mLs by mouth 3 (three) times daily as needed for mouth pain. 150 mL 2  . metoprolol succinate (TOPROL-XL) 50 MG 24 hr tablet TAKE 1 TABLET (50 MG TOTAL) BY MOUTH DAILY. TAKE WITH OR IMMEDIATELY FOLLOWING A MEAL. 90 tablet 0  . nystatin (MYCOSTATIN) 100000 UNIT/ML suspension     . omeprazole (PRILOSEC) 40 MG capsule TAKE 1 CAPSULE BY MOUTH EVERY DAY 90 capsule 1  . potassium chloride SA (KLOR-CON) 20 MEQ tablet Take 20 mEq by mouth daily.    Marland Kitchen spironolactone (ALDACTONE) 25 MG tablet Take 25 mg by mouth daily.    . tamoxifen (NOLVADEX) 20 MG tablet Take 1 tablet (20 mg total) by mouth daily. 90 tablet 1  . [DISCONTINUED] prochlorperazine (COMPAZINE) 10 MG tablet TAKE 1 TABLET BY MOUTH EVERY 6 HOURS AS NEEDED FOR NAUSEA OR VOMITING 30 tablet 2   Current Facility-Administered  Medications on File Prior to Visit  Medication Dose Route Frequency Provider Last Rate Last Admin  . sodium chloride flush (NS) 0.9 % injection 10 mL  10 mL Intracatheter PRN Magrinat, Virgie Dad, MD   10 mL at 09/19/18 1551    BP 126/86   Pulse 66   Temp (!) 96.3 F (35.7 C) (Temporal)   Ht _0  (1.651 m)   Wt 238 lb 3 oz (108 kg)   LMP 03/31/2014 Comment: Negative Serum HCG 06/07/17  SpO2 96%   BMI 39.64 kg/m    Objective:   Physical Exam  Constitutional: She appears well-nourished.  Cardiovascular: Normal rate and regular rhythm.  Respiratory: Effort normal and breath sounds normal.  Musculoskeletal:     Cervical back: Neck supple.     Left foot: Decreased range of motion. Tenderness present. No swelling, deformity or bony tenderness.       Feet:     Comments: Mild tenderness to left metatarsal joint of left great toe.  Skin: Skin is warm and dry. No erythema.  Psychiatric: She has a normal mood and  affect.           Assessment & Plan:

## 2020-01-27 NOTE — Assessment & Plan Note (Signed)
Chronic, managed on gabapentin but doesn't know the strength. Not on medication list. She will call back with the dose.

## 2020-01-27 NOTE — Patient Instructions (Addendum)
Resume rosuvastatin (Crestor) once daily for cholesterol.  Be sure to take your levothyroxine (thyroid medication) every morning on an empty stomach with water only. No food or other medications for 30 minutes. No heartburn medication, iron pills, calcium, vitamin D, or magnesium pills within four hours of taking levothyroxine.   Start Trulicity once weekly for diabetes.  Make sure [facility] draws the Uric Acid level.  Call me with your gabapentin dose and directions.  Please schedule a follow up appointment in 3 months.  It was a pleasure to see you today!

## 2020-01-27 NOTE — Assessment & Plan Note (Signed)
Following with nephrology. No longer on metformin. Managed on ARB. BP under good control.  Will gain better control over diabetes.

## 2020-01-27 NOTE — Assessment & Plan Note (Signed)
Followed by infectious disease. Continue doxycycline.

## 2020-01-27 NOTE — Assessment & Plan Note (Signed)
A1C of 8.0 today which is above goal. She cannot tolerate glipizide BID due to hypoglycemic episodes. Cannot take Metformin due to CKD.  Add Trulicity 1.23 mg weekly.  She will update, follow up in 3 months.

## 2020-01-27 NOTE — Assessment & Plan Note (Signed)
Following with oncology, mammogram UTD. Continue tamoxifen.

## 2020-01-27 NOTE — Assessment & Plan Note (Signed)
LDL from February 2021 above goal. Resume Crestor 10 mg.  Repeat lipids next visit.

## 2020-01-27 NOTE — Assessment & Plan Note (Signed)
Stable in the office today, has not taken amlodipine in months. Continue off amlodipine, continue on spirolactone, irbesartan-HCTZ, metoprolol.

## 2020-01-27 NOTE — Assessment & Plan Note (Signed)
New diagnosis, treated by ID, improved with prednisone. Checking Uric acid level today. She will update if symptoms return.

## 2020-01-27 NOTE — Addendum Note (Signed)
Addended by: Cloyd Stagers on: 01/27/2020 02:06 PM   Modules accepted: Orders

## 2020-01-27 NOTE — Assessment & Plan Note (Signed)
Followed by ID, continue doxycycline.

## 2020-01-27 NOTE — Assessment & Plan Note (Signed)
Mostly taking levothyroxine correctly, sometimes not. Repeat TSH pending.

## 2020-01-30 ENCOUNTER — Telehealth: Payer: Self-pay | Admitting: Primary Care

## 2020-01-30 DIAGNOSIS — E890 Postprocedural hypothyroidism: Secondary | ICD-10-CM

## 2020-01-30 NOTE — Telephone Encounter (Signed)
Please notify patient that her thyroid level is uncontrolled. She needs to see endocrinology, and we need to increase her dose of levothyroxine to 200 mcg.  Also, her lab test did confirm gout. If her symptoms return have her call me.  I will send a new prescription once I hear back from you.

## 2020-01-31 LAB — SPECIMEN STATUS REPORT

## 2020-01-31 LAB — TSH: TSH: 17.4 u[IU]/mL — ABNORMAL HIGH (ref 0.450–4.500)

## 2020-01-31 LAB — URIC ACID: Uric Acid: 9 mg/dL — ABNORMAL HIGH (ref 3.0–7.2)

## 2020-01-31 NOTE — Telephone Encounter (Signed)
Message left for patient to return my call.  

## 2020-02-04 NOTE — Telephone Encounter (Signed)
Message left for patient to return my call.  

## 2020-02-07 MED ORDER — LEVOTHYROXINE SODIUM 200 MCG PO TABS: ORAL_TABLET | ORAL | 1 refills | Status: DC

## 2020-02-07 NOTE — Telephone Encounter (Signed)
Spoken and notified patient of Felicia Millers comments. Patient verbalized understanding. Patient is agreeable to see an endocrinology and please send Rx for the 200 mcg

## 2020-02-07 NOTE — Telephone Encounter (Signed)
Noted, Rx sent to pharmacy. Referral placed.

## 2020-02-11 NOTE — Progress Notes (Signed)
SURVIVORSHIP VIRTUAL VISIT:  BRIEF ONCOLOGIC HISTORY:  Oncology History  Malignant neoplasm of lower-inner quadrant of right breast of female, estrogen receptor positive ([place])  05/10/2018 Initial Diagnosis   Malignant neoplasm of lower-inner quadrant of right breast of female, estrogen receptor positive ([place])   05/31/2018 - 11/12/2018 Chemotherapy   palonosetron (ALOXI) injection 0.25 mg, 0.25 mg, Intravenous,  Once, 6 of 6 cycles. Administration: 0.25 mg (05/31/2018), 0.25 mg (06/21/2018), 0.25 mg (08/23/2018), 0.25 mg (09/14/2018), 0.25 mg (07/12/2018), 0.25 mg (08/02/2018)  pegfilgrastim-cbqv (UDENYCA) injection 6 mg, 6 mg, Subcutaneous, Once, 6 of 6 cycles. Administration: 6 mg (06/02/2018), 6 mg (06/23/2018), 6 mg (08/25/2018), 6 mg (09/17/2018), 6 mg (07/14/2018), 6 mg (08/04/2018)  trastuzumab (HERCEPTIN) 900 mg in sodium chloride 0.9 % 250 mL chemo infusion, 903 mg, Intravenous,  Once, 7 of 7 cycles Administration: 672 mg (06/21/2018), 672 mg (08/23/2018), 672 mg (09/14/2018), 900 mg (06/01/2018), 672 mg (07/12/2018), 672 mg (08/02/2018), 672 mg (10/23/2018)  CARBOplatin (PARAPLATIN) 580 mg in sodium chloride 0.9 % 250 mL chemo infusion, 580 mg (98.8 % of original dose 588 mg), Intravenous,  Once, 6 of 6 cycles. Dose modification:   (original dose 588 mg, Cycle 1). Administration: 580 mg (05/31/2018), 500 mg (06/21/2018), 560 mg (08/23/2018), 560 mg (09/14/2018), 530 mg (07/12/2018), 570 mg (08/02/2018)  DOCEtaxel (TAXOTERE) 170 mg in sodium chloride 0.9 % 250 mL chemo infusion, 75 mg/m2 = 170 mg, Intravenous,  Once, 4 of 4 cycles. Administration: 170 mg (05/31/2018), 170 mg (06/21/2018), 170 mg (07/12/2018), 170 mg (08/02/2018)  gemcitabine (GEMZAR) 1,824 mg in sodium chloride 0.9 % 100 mL chemo infusion, 800 mg/m2 = 1,824 mg (100 % of original dose 800 mg/m2), Intravenous,  Once, 2 of 2 cycles. Dose modification: 800 mg/m2 (original dose 800 mg/m2, Cycle 5, Reason: Provider Judgment). Administration: 1,824 mg  (08/23/2018), 1,824 mg (09/14/2018)  pertuzumab (PERJETA) 840 mg in sodium chloride 0.9 % 250 mL chemo infusion, 840 mg, Intravenous, Once, 1 of 1 cycle Administration: 840 mg (06/01/2018)  Fosaprepitant (EMEND) 150 mg  dexamethasone (DECADRON) 12 mg in sodium chloride 0.9 % 145 mL IVPB, , Intravenous,  Once, 6 of 6 cycles. Administration:  (05/31/2018),  (06/21/2018),  (08/23/2018),  (09/14/2018),  (07/12/2018),  (08/02/2018)   07/25/2018 Genetic Testing   Genetic testing performed through [facility] [employer] reported out on 07/24/2018 showed no pathogenic mutations. Genes Analyzed (43 total): APC*, ATM*, AXIN2, BARD1, BMPR1A, BRCA1*, BRCA2*, BRIP1*, CDH1*, CDK4, CDKN2A, CHEK2*, DICER1,GALNT12, HOXB13, MEN1, MLH1*, MRE11A, MSH2*, MSH3, MSH6*, MUTYH*, NBN, NF1*, NTHL1, PALB2*, PMS2*, POLD1, POLE,PTEN*, RAD50, RAD51C*, RAD51D*, RET, SDHB, SDHD, SMAD4, SMARCA4, STK11 and TP53* (sequencing and deletion/duplication); EGFR (sequencing only); EPCAM and GREM1 (deletion/duplication only). DNA and RNA analyses performed for * genes.   10/03/2018 Surgery   Right lumpectomy ([facility]) [id number]): residual ypT1c ypN0 invasive ductal carcinoma, grade 2, with negative margins; a total of 5 sentinel lymph nodes removed. Triple positive.   11/13/2018 - 11/24/2019 Chemotherapy   ado-trastuzumab emtansine (KADCYLA) 360 mg in sodium chloride 0.9 % 250 mL chemo infusion, 380 mg, Intravenous, Once, 16 of 16 cycles. Administration: 360 mg (11/13/2018), 360 mg (12/06/2018), 360 mg (02/07/2019), 360 mg (12/27/2018), 360 mg (01/17/2019), 360 mg (02/28/2019), 360 mg (04/12/2019), 360 mg (03/22/2019), 360 mg (05/03/2019), 360 mg (07/02/2019), 360 mg (08/12/2019), 360 mg (09/02/2019), 360 mg (09/23/2019), 360 mg (10/14/2019), 360 mg (11/04/2019)   01/07/2019 - 02/21/2019 Radiation Therapy   The patient initially received a dose of 50.4 Gy in 28 fractions to the right breast using whole-breast tangent  fields. This was delivered using a 3-D  conformal technique. The patient then received a boost to the seroma. This delivered an additional 10 Gy in 5 fractions using 12e electrons with a special teletherapy technique. The total dose was 60.4 Gy.   02/2019 - 02/2024 Anti-estrogen oral therapy   Tamoxifen     INTERVAL HISTORY:  Ms. Pizzolato to review her survivorship care plan detailing her treatment course for breast cancer, as well as monitoring long-term side effects of that treatment, education regarding health maintenance, screening, and overall wellness and health promotion.     Overall, Ms. Mcdonald reports feeling quite well.  She is taking tamoxifen daily and is tolerating it well.  She does have some hot flashes.  She completed the survivorship survey (see attached sheet).  She screened positive for distress, shortness of breath, cognitive dysfunction, fatigue, and hot flashes.  She has continued peripheral neuropathy, and struggles with the numbness.  She has taken gabapentin and it has helped some.  She notes on the nights she takes this she has less hot flashes.  She has difficulty remembering, and is struggling with this. She compensates for this by using sticky notes.  She has had a sleep study and is going to get the CPAP that she was recommended.  She has had a persistent TSH of 17 and is going to see endocrinology.    ROS as noted below.  REVIEW OF SYSTEMS:  Review of Systems  Constitutional: Positive for fatigue (TSH was 17 a couple of week ago). Negative for appetite change, chills, fever and unexpected weight change.  HENT:   Negative for hearing loss, lump/mass, sore throat and trouble swallowing.   Eyes: Negative for eye problems and icterus.  Respiratory: Positive for shortness of breath. Negative for chest tightness and cough.   Cardiovascular: Positive for palpitations (occasionally with shortness of breath). Negative for chest pain and leg swelling.  Gastrointestinal: Negative for abdominal distention, abdominal pain,  constipation, diarrhea, nausea and vomiting.  Endocrine: Positive for hot flashes.  Genitourinary: Negative for difficulty urinating.   Musculoskeletal: Negative for arthralgias.  Skin: Negative for itching and rash.  Neurological: Negative for dizziness, extremity weakness, headaches and numbness.  Hematological: Negative for adenopathy. Does not bruise/bleed easily.  Psychiatric/Behavioral: Positive for decreased concentration, depression and sleep disturbance. Negative for suicidal ideas.  Breast: Denies any new nodularity, masses, tenderness, nipple changes, or nipple discharge.      ONCOLOGY TREATMENT TEAM:  1. Surgeon:  Dr. Brantley Stage at [facility] 2. Medical Oncologist: Dr. [name]  3. Radiation Oncologist: Dr. Lisbeth Renshaw    PAST MEDICAL/SURGICAL HISTORY:  Past Medical History:  Diagnosis Date  . Allergy   . Anemia    Normocytic  . Anxiety   . Asthma   . Blood dyscrasia   . Bronchitis 2005  . 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 Diabetes mellitus without complication ([place])   . Family history of breast cancer   . Family history of lung cancer   . Family history of prostate cancer   . Gastroenteritis 07/10/07  . GERD 07/24/2006  . GRAVE'S DISEASE 01/01/2008  . History of hidradenitis suppurativa   . History of kidney stones   . History of thrush   . HIV DISEASE 07/24/2006   dx March 05  . Hyperlipidemia   . HYPERTENSION 07/24/2006  . Hyperthyroidism 08/2006   Grave's Disease -diffuse radiotracer uptake 08/25/06 Thyroid scan-Cold nodule to R lower lobe of thyrorid  . Menometrorrhagia    hx of  .  Nephrolithiasis   . 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 Personal history of chemotherapy    2020  . Personal history of radiation therapy    2020  . Pneumonia 2005  . 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 Sarcoidosis 02/08/2007   dx as a teenager in [place] from abnl CXR. Completed 2  yrs Prednisone after lung bx confirmation. No symptoms since then.  . Suppurative hidradenitis   . Thyroid cancer ([place])   . THYROID NODULE, RIGHT 02/08/2007   Past Surgical History:  Procedure Laterality Date  . BREAST EXCISIONAL BIOPSY Right 04/26/2018   right axilla negative  . BREAST EXCISIONAL BIOPSY Left 04/26/2018   left axilla negative  . BREAST LUMPECTOMY Right 10/03/2018   malignant  . BREAST LUMPECTOMY WITH RADIOACTIVE SEED AND SENTINEL LYMPH NODE BIOPSY Right 10/03/2018   Procedure: RIGHT BREAST LUMPECTOMY WITH RADIOACTIVE SEED AND SENTINEL LYMPH NODE MAPPING;  Surgeon: Erroll Luna, MD;  Location: [facility];  Service: General;  Laterality: Right;  . BREAST SURGERY  1997   Breast Reduction   . CYSTOSCOPY W/ URETERAL STENT REMOVAL  11/09/2012   Procedure: CYSTOSCOPY WITH STENT REMOVAL;  Surgeon: Alexis Frock, MD;  Location[facility];  Service: Urology;  Laterality: Right;  . CYSTOSCOPY WITH RETROGRADE PYELOGRAM, URETEROSCOPY AND STENT PLACEMENT  11/09/2012   Procedure: CYSTOSCOPY WITH RETROGRADE PYELOGRAM, URETEROSCOPY AND STENT PLACEMENT;  Surgeon: Alexis Frock, MD;  [facility];  Service: Urology;  Laterality: Left;  LEFT URETEROSCOPY, STONE MANIPULATION, left STENT exchange   . CYSTOSCOPY WITH STENT PLACEMENT  10/02/2012   Procedure: CYSTOSCOPY WITH STENT PLACEMENT;  Surgeon: Alexis Frock, MD;  [facility];  Service: Urology;  Laterality: Left;  . DILATION AND CURETTAGE OF UTERUS  Feb 2004   s/p for 1st trimester nonviable pregnancy  . EYE SURGERY     sty under eyelid  . INCISE AND DRAIN ABCESS  Nov 03   s/p I &D for righ inframmary fold hidradenitis  . INCISION AND DRAINAGE PERITONSILLAR ABSCESS  Mar 03  . IR CV LINE INJECTION  06/07/2018  . IR IMAGING GUIDED PORT INSERTION  06/20/2018  . IR REMOVAL TUN ACCESS W/ PORT W/O FL MOD SED  06/20/2018  . IRRIGATION AND DEBRIDEMENT ABSCESS  01/31/2012   Procedure: IRRIGATION AND DEBRIDEMENT ABSCESS;  Surgeon: Shann Medal, MD;  [facility];  Service: General;  Laterality: Right;  right breast and axilla   . NEPHROLITHOTOMY  10/02/2012   Procedure: NEPHROLITHOTOMY PERCUTANEOUS;  Surgeon: Alexis Frock, MD;  [facility];  Service: Urology;  Laterality: Right;  First Stage Percutaneous Nephrolithotomy with Surgeon Access, Left Ureteral Stent    . NEPHROLITHOTOMY  10/04/2012   Procedure: NEPHROLITHOTOMY PERCUTANEOUS SECOND LOOK;  Surgeon: Alexis Frock, MD;  [facility];  Service: Urology;  Laterality: Right;     . NEPHROLITHOTOMY  10/08/2012   Procedure: NEPHROLITHOTOMY PERCUTANEOUS;  Surgeon: Alexis Frock, MD;  [facility];  Service: Urology;  Laterality: Right;  THIRD STAGE, nephrostomy tube exchange x 2  . NEPHROLITHOTOMY  10/11/2012   Procedure: NEPHROLITHOTOMY PERCUTANEOUS SECOND LOOK;  Surgeon: Alexis Frock, MD;  [facility];  Service: Urology;  Laterality: Right;  RIGHT 4 STAGE PERCUTANOUS NEPHROLITHOTOMY, right URETEROSCOPY WITH HOLMIUM LASER   . PORTACATH PLACEMENT Left 05/17/2018   Procedure: INSERTION PORT-A-CATH;  Surgeon: Coralie Keens, MD;  Location: [facility];  Service: General;  Laterality: Left;  . RADICAL NECK DISSECTION  04/12/2017  limited/notes 04/12/2017  . RADICAL NECK DISSECTION N/A 04/12/2017   Procedure: RADICAL NECK DISSECTION;  Surgeon: Melida Quitter, MD;  Location: [facility];  Service: ENT;  Laterality: N/A;  limited neck dissection 2 hours total  . REDUCTION MAMMAPLASTY Bilateral 1998  . [place]  1994  . THYROIDECTOMY  04/12/2017   completion/notes 04/12/2017  . THYROIDECTOMY N/A 04/12/2017   Procedure: THYROIDECTOMY;  Surgeon: Melida Quitter, MD;  Location: [facility];  Service: ENT;  Laterality: N/A;  Completion Thyroidectomy  . TOTAL THYROIDECTOMY  2010     ALLERGIES:  Allergies  Allergen Reactions  . Genvoya [Elviteg-Cobic-Emtricit-Tenofaf] Hives  . Lisinopril Cough  . Valsartan Other (See Comments) and Cough    Pt states she  tolerates medicine now     CURRENT MEDICATIONS:  Outpatient Encounter Medications as of 02/12/2020  Medication Sig  . gabapentin (NEURONTIN) 100 MG capsule Take 300 mg by mouth at bedtime.  . Blood Glucose Monitoring Suppl (ONETOUCH VERIO) w/Device KIT USE AS INSTRUCTED TO TEST BLOOD SUGAR UP TO 4 TIMES DAILY E11.9  . dolutegravir ([name]) 50 MG tablet Take 1 tablet (50 mg total) by mouth daily.  Marland Kitchen doxycycline (VIBRA-TABS) 100 MG tablet Take 1 tablet (100 mg total) by mouth 2 (two) times daily.  . Dulaglutide (TRULICITY) 1.76 HY/0.7PX SOPN Inject 0.75 mg into the skin once a week. For diabetes.  Marland Kitchen emtricitabine-tenofovir AF (DESCOVY) 200-25 MG tablet Take 1 tablet by mouth daily.  Marland Kitchen FLOVENT HFA 110 MCG/ACT inhaler TAKE 1 PUFF BY MOUTH TWICE A DAY  . glipiZIDE (GLUCOTROL) 5 MG tablet TAKE 1 TABLET (5 MG TOTAL) BY MOUTH 2 (TWO) TIMES DAILY BEFORE A MEAL. FOR DIABETES.  Marland Kitchen glucose blood (ONETOUCH VERIO) test strip USE AS INSTRUCTED TO TEST BLOOD SUGAR DAILY  . irbesartan-hydrochlorothiazide (AVALIDE) 150-12.5 MG tablet TAKE 1 TABLET BY MOUTH EVERY DAY FOR BLOOD PRESSURE  . Lancets (ONETOUCH ULTRASOFT) lancets Use as instructed to test blood sugar daily  . levothyroxine (SYNTHROID) 200 MCG tablet Take 1 tablet by mouth every morning on an empty stomach with water only.  No food or other medications for 30 minutes.  . magic mouthwash SOLN Take 5 mLs by mouth 3 (three) times daily as needed for mouth pain.  . metoprolol succinate (TOPROL-XL) 50 MG 24 hr tablet TAKE 1 TABLET (50 MG TOTAL) BY MOUTH DAILY. TAKE WITH OR IMMEDIATELY FOLLOWING A MEAL.  Marland Kitchen nystatin (MYCOSTATIN) 100000 UNIT/ML suspension   . omeprazole (PRILOSEC) 40 MG capsule TAKE 1 CAPSULE BY MOUTH EVERY DAY  . potassium chloride SA (KLOR-CON) 20 MEQ tablet Take 20 mEq by mouth daily.  . rosuvastatin (CRESTOR) 10 MG tablet TAKE 1 TABLET BY MOUTH EVERY DAY FOR CHOLESTEROL  . spironolactone (ALDACTONE) 25 MG tablet Take 25 mg by mouth  daily.  . tamoxifen (NOLVADEX) 20 MG tablet Take 1 tablet (20 mg total) by mouth daily.  . [DISCONTINUED] prochlorperazine (COMPAZINE) 10 MG tablet TAKE 1 TABLET BY MOUTH EVERY 6 HOURS AS NEEDED FOR NAUSEA OR VOMITING   Facility-Administered Encounter Medications as of 02/12/2020  Medication  . sodium chloride flush (NS) 0.9 % injection 10 mL     ONCOLOGIC FAMILY HISTORY:  Family History  Problem Relation Age of Onset  . Hypertension Mother   . Cancer Mother        laryngeal  . Heart disease Mother        stent  . Hypertension Father   . Lung cancer Father 85       hx smoking  .  Heart disease Other   . Hypertension Other   . Stroke Other        Grandparent  . Kidney disease Other        Grandparent  . Diabetes Other        FH of Diabetes  . Hypertension Sister   . Cancer Maternal Uncle        Lung CA  . Hypertension Brother   . Hypertension Sister   . Breast cancer Maternal Aunt 65  . Breast cancer Paternal Aunt 80  . Prostate cancer Paternal Uncle   . Breast cancer Maternal Aunt        dx 60+  . Breast cancer Paternal Aunt        dx 60's  . Breast cancer Paternal Aunt        dx 72's  . Prostate cancer Paternal Uncle   . Lung cancer Paternal Uncle   . Breast cancer Cousin 20  . Breast cancer Cousin        dx <50  . Breast cancer Cousin        dx <50  . Breast cancer Cousin        dx <50  . Colon cancer Neg Hx   . Esophageal cancer Neg Hx   . Rectal cancer Neg Hx   . Stomach cancer Neg Hx      GENETIC COUNSELING/TESTING: See above  SOCIAL HISTORY:  Social History   Socioeconomic History  . Marital status: Single    Spouse name: Not on file  . Number of children: 1  . Years of education: Not on file  . Highest education level: Not on file  Occupational History  . Occupation: Secondary school teacher  Tobacco Use  . Smoking status: Former Smoker    Packs/day: 0.50    Years: 15.00    Pack years: 7.50    Types: Cigarettes    Start date: 04/12/2017  .  Smokeless tobacco: Never Used  . Tobacco comment: quit smoking  may 2018  Substance and Sexual Activity  . Alcohol use: Yes    Alcohol/week: 0.0 standard drinks    Comment: social  . Drug use: No  . Sexual activity: Not Currently    Birth control/protection: Post-menopausal    Comment: declined condoms  Other Topics Concern  . Not on file  Social History Narrative  . Not on file   Social Determinants of Health   Financial Resource Strain:   . Difficulty of Paying Living Expenses:   Food Insecurity:   . Worried About Charity fundraiser in the Last Year:   . Arboriculturist in the Last Year:   Transportation Needs:   . Film/video editor (Medical):   Marland Kitchen Lack of Transportation (Non-Medical):   Physical Activity:   . Days of Exercise per Week:   . Minutes of Exercise per Session:   Stress:   . Feeling of Stress :   Social Connections:   . Frequency of Communication with Friends and Family:   . Frequency of Social Gatherings with Friends and Family:   . Attends Religious Services:   . Active Member of Clubs or Organizations:   . Attends Archivist Meetings:   Marland Kitchen Marital Status:   Intimate Partner Violence:   . Fear of Current or Ex-Partner:   . Emotionally Abused:   Marland Kitchen Physically Abused:   . Sexually Abused:      OBSERVATIONS/OBJECTIVE:  BP (!) 150/94 (BP Location: Left Arm, Patient  Position: Sitting)   Pulse 74   Temp 98.3 F (36.8 C) (Temporal)   Resp 20   Ht _0  (1.651 m)   Wt 235 lb 8 oz (106.8 kg)   LMP 03/31/2014 Comment: Negative Serum HCG 06/07/17  SpO2 100%   BMI 39.19 kg/m  GENERAL: Patient is a well appearing female in no acute distress HEENT:  Sclerae anicteric.  Mask in place. Neck is supple.  NODES:  No cervical, supraclavicular, or axillary lymphadenopathy palpated.  BREAST EXAM:  Right breast s/p lumpectomy and radiation, no sign of local recurrence, left breast benign LUNGS:  Clear to auscultation bilaterally.  No wheezes or  rhonchi. HEART:  Regular rate and rhythm. No murmur appreciated. ABDOMEN:  Soft, nontender.  Positive, normoactive bowel sounds. No organomegaly palpated. MSK:  No focal spinal tenderness to palpation. Full range of motion bilaterally in the upper extremities. EXTREMITIES:  No peripheral edema.   SKIN:  Clear with no obvious rashes or skin changes. No nail dyscrasia. NEURO:  Nonfocal. Well oriented.  Appropriate affect.    LABORATORY DATA:  None for this visit.  DIAGNOSTIC IMAGING:  None for this visit.      ASSESSMENT AND PLAN:  Ms.. Tate is a pleasant 54 y.o. female with Stage IB right breast invasive ductal carcinoma, ER+/PR+/HER2-, diagnosed in 04/2018, treated with neoadjuvant chemotherapy, lumpectomy, adjuvant radiation therapy, maintenance Kadcyla and anti-estrogen therapy with Tamoxifen beginning in 02/2019.  She presents to the Survivorship [facility] for our initial meeting and routine follow-up post-completion of treatment for breast cancer.    1. Stage IB right breast cancer:  Ms. Poth is continuing to recover from definitive treatment for breast cancer. She will follow-up with her medical oncologist, Dr. [name] in 3 months with history and physical exam per surveillance protocol.  She will continue her anti-estrogen therapy with Tamoxifen. Thus far, she is tolerating the Tamoxifen well, with minimal side effects.  Her mammogram is due 05/2020; orders placed today.  Her breast density is category b .  I referred her to Dr. Iran Planas because she is concerned about the noticeable asymmetry in her breasts.  Today, a comprehensive survivorship care plan and treatment summary was reviewed with the patient today detailing her breast cancer diagnosis, treatment course, potential late/long-term effects of treatment, appropriate follow-up care with recommendations for the future, and patient education resources.  A copy of this summary, along with a letter will be sent to the patient's primary  care provider via mail/fax/In Basket message after today's visit.    2. Positive Distress Screen: Resources given in care plan material. QOL-CSV completed and sent to social work for scoring and f/u.  I also asked our social worker to sign her up for [facility] which begins on 02/17/2020.  3. Shortness of breath: Most noticeable on exertion.  She says it happens very quickly.  She uses an inhaler more.  Chest xray, and echocardiogram ordered.  Will get her back in with Bensimhon.  I sent him and his nurse Nira Conn a message about the associated fluttering to see if she needed to get an event monitor prior.    4.  Cognitive dysfunction: Likely secondary to treatment and cancer diagnosis, along with her increased stress.  Reviewed with [name] a neuro oncology referral to Dr. Mickeal Skinner and she would like to see him.  Referral placed.  5. Fatigue: This is likely related to her elevated TSH.  She recently saw Allie Bossier her PCP for this, and has been referred to endocrinology.  She is awaiting an appointment with them.    6.  Hot flashes/night sweats: Recommended she continue Gabapentin.  This will help.  She has had some hydradenitis with this, and has restarted Doxycycline.   7.  Sleep pattern disturbance: Has undergone sleep study and was recommended a CPAP.  She is going to work on getting one now that she has insurance.  8. Chemotherapy induced peripheral neuropathy: Will continue Gabapentin.  I discussed the potential for PT referral and she will let me know if she needs this.  9. Bone health:    She was given education on specific activities to promote bone health.  10. Cancer screening:  Due to Ms. Laser's history and her age, she should receive screening for skin cancers, colon cancer, and gynecologic cancers.  The information and recommendations are listed on the patient's comprehensive care plan/treatment summary and were reviewed in detail with the patient.    11. Health maintenance and wellness  promotion: Ms. Schirm was encouraged to consume 5-7 servings of fruits and vegetables per day. We reviewed the "Nutrition Rainbow" handout, as well as the handout "Take Control of Your Health and Reduce Your Cancer Risk" from the [place].  She was also encouraged to engage in moderate to vigorous exercise for 30 minutes per day most days of the week. We discussed the [employer] fitness program, which is designed for cancer survivors to help them become more physically fit after cancer treatments.  She was instructed to limit her alcohol consumption and continue to abstain from tobacco use.     12. Support services/counseling: It is not uncommon for this period of the patient's cancer care trajectory to be one of many emotions and stressors.  We discussed how this can be increasingly difficult during the times of quarantine and social distancing due to the COVID-19 pandemic.   She was given information regarding our available services and encouraged to contact me with any questions or for help enrolling in any of our support group/programs.    Follow up instructions:    -Return to cancer center in 06/2020 for f/u with Dr. [name]  -Mammogram due in 05/2020 -Follow up with me in 12/2019 -refer to Dr. Mickeal Skinner -refer to Dr. Iran Planas -chest xray and echocardiogram -f/u with Dr. Haroldine Laws next available  Total encounter time: 60 minutes*  Wilber Bihari, NP 02/12/20 10:54 AM Medical Oncology and Hematology [facility] [place], [place] [address] Tel. [phone]    Fax. [phone]  *Total Encounter Time as defined by the Centers for Medicare and Medicaid Services includes, in addition to the face-to-face time of a patient visit (documented in the note above) non-face-to-face time: obtaining and reviewing outside history, ordering and reviewing medications, tests or procedures, care coordination (communications with other health care professionals or  caregivers) and documentation in the medical record.

## 2020-02-12 ENCOUNTER — Encounter: Payer: Self-pay | Admitting: Adult Health

## 2020-02-12 ENCOUNTER — Telehealth: Payer: Self-pay

## 2020-02-12 ENCOUNTER — Ambulatory Visit (HOSPITAL_COMMUNITY)
Admission: RE | Admit: 2020-02-12 | Discharge: 2020-02-12 | Disposition: A | Discharge status: Home / Self Care | Payer: 59 | Source: Ambulatory Visit | Attending: Adult Health | Admitting: Adult Health

## 2020-02-12 ENCOUNTER — Other Ambulatory Visit: Payer: Self-pay

## 2020-02-12 ENCOUNTER — Inpatient Hospital Stay: Payer: 59 | Attending: Adult Health

## 2020-02-12 ENCOUNTER — Inpatient Hospital Stay (HOSPITAL_BASED_OUTPATIENT_CLINIC_OR_DEPARTMENT_OTHER): Payer: 59 | Admitting: Adult Health

## 2020-02-12 VITALS — BP 150/94 | HR 74 | Temp 98.3°F | Resp 20 | Ht 65.0 in | Wt 235.5 lb

## 2020-02-12 DIAGNOSIS — L732 Hidradenitis suppurativa: Secondary | ICD-10-CM | POA: Insufficient documentation

## 2020-02-12 DIAGNOSIS — Z87442 Personal history of urinary calculi: Secondary | ICD-10-CM | POA: Diagnosis not present

## 2020-02-12 DIAGNOSIS — Z9221 Personal history of antineoplastic chemotherapy: Secondary | ICD-10-CM | POA: Insufficient documentation

## 2020-02-12 DIAGNOSIS — Z85850 Personal history of malignant neoplasm of thyroid: Secondary | ICD-10-CM | POA: Insufficient documentation

## 2020-02-12 DIAGNOSIS — R5383 Other fatigue: Secondary | ICD-10-CM | POA: Insufficient documentation

## 2020-02-12 DIAGNOSIS — Z170 Estrogen receptor positive status [ER+]: Secondary | ICD-10-CM

## 2020-02-12 DIAGNOSIS — Z803 Family history of malignant neoplasm of breast: Secondary | ICD-10-CM | POA: Insufficient documentation

## 2020-02-12 DIAGNOSIS — T451X5A Adverse effect of antineoplastic and immunosuppressive drugs, initial encounter: Secondary | ICD-10-CM | POA: Insufficient documentation

## 2020-02-12 DIAGNOSIS — Z21 Asymptomatic human immunodeficiency virus [HIV] infection status: Secondary | ICD-10-CM | POA: Insufficient documentation

## 2020-02-12 DIAGNOSIS — E119 Type 2 diabetes mellitus without complications: Secondary | ICD-10-CM | POA: Insufficient documentation

## 2020-02-12 DIAGNOSIS — R61 Generalized hyperhidrosis: Secondary | ICD-10-CM | POA: Diagnosis not present

## 2020-02-12 DIAGNOSIS — C50211 Malignant neoplasm of upper-inner quadrant of right female breast: Secondary | ICD-10-CM

## 2020-02-12 DIAGNOSIS — K219 Gastro-esophageal reflux disease without esophagitis: Secondary | ICD-10-CM | POA: Insufficient documentation

## 2020-02-12 DIAGNOSIS — C50311 Malignant neoplasm of lower-inner quadrant of right female breast: Secondary | ICD-10-CM

## 2020-02-12 DIAGNOSIS — R0602 Shortness of breath: Secondary | ICD-10-CM | POA: Insufficient documentation

## 2020-02-12 DIAGNOSIS — R4189 Other symptoms and signs involving cognitive functions and awareness: Secondary | ICD-10-CM | POA: Diagnosis not present

## 2020-02-12 DIAGNOSIS — Z801 Family history of malignant neoplasm of trachea, bronchus and lung: Secondary | ICD-10-CM | POA: Insufficient documentation

## 2020-02-12 DIAGNOSIS — G620 Drug-induced polyneuropathy: Secondary | ICD-10-CM | POA: Diagnosis not present

## 2020-02-12 DIAGNOSIS — Z923 Personal history of irradiation: Secondary | ICD-10-CM | POA: Diagnosis not present

## 2020-02-12 DIAGNOSIS — Z79810 Long term (current) use of selective estrogen receptor modulators (SERMs): Secondary | ICD-10-CM | POA: Diagnosis not present

## 2020-02-12 DIAGNOSIS — R232 Flushing: Secondary | ICD-10-CM | POA: Insufficient documentation

## 2020-02-12 DIAGNOSIS — C73 Malignant neoplasm of thyroid gland: Secondary | ICD-10-CM

## 2020-02-12 DIAGNOSIS — I10 Essential (primary) hypertension: Secondary | ICD-10-CM | POA: Diagnosis not present

## 2020-02-12 LAB — COMPREHENSIVE METABOLIC PANEL
ALT: 26 U/L (ref 0–44)
AST: 34 U/L (ref 15–41)
Albumin: 2.9 g/dL — ABNORMAL LOW (ref 3.5–5.0)
Alkaline Phosphatase: 90 U/L (ref 38–126)
Anion gap: 9 (ref 5–15)
BUN: 22 mg/dL — ABNORMAL HIGH (ref 6–20)
CO2: 30 mmol/L (ref 22–32)
Calcium: 9.3 mg/dL (ref 8.9–10.3)
Chloride: 101 mmol/L (ref 98–111)
Creatinine, Ser: 1.72 mg/dL — ABNORMAL HIGH (ref 0.44–1.00)
GFR calc Af Amer: 39 mL/min — ABNORMAL LOW (ref >60)
GFR calc non Af Amer: 34 mL/min — ABNORMAL LOW (ref >60)
Glucose, Bld: 236 mg/dL — ABNORMAL HIGH (ref 70–99)
Potassium: 3.5 mmol/L (ref 3.5–5.1)
Sodium: 140 mmol/L (ref 135–145)
Total Bilirubin: 0.2 mg/dL — ABNORMAL LOW (ref 0.3–1.2)
Total Protein: 7.6 g/dL (ref 6.5–8.1)

## 2020-02-12 LAB — CBC WITH DIFFERENTIAL/PLATELET
Abs Immature Granulocytes: 0.18 10*3/uL — ABNORMAL HIGH (ref 0.00–0.07)
Basophils Absolute: 0.1 10*3/uL (ref 0.0–0.1)
Basophils Relative: 1 %
Eosinophils Absolute: 0.1 10*3/uL (ref 0.0–0.5)
Eosinophils Relative: 1 %
HCT: 36.4 % (ref 36.0–46.0)
Hemoglobin: 12.1 g/dL (ref 12.0–15.0)
Immature Granulocytes: 2 %
Lymphocytes Relative: 29 %
Lymphs Abs: 2.2 10*3/uL (ref 0.7–4.0)
MCH: 31.1 pg (ref 26.0–34.0)
MCHC: 33.2 g/dL (ref 30.0–36.0)
MCV: 93.6 fL (ref 80.0–100.0)
Monocytes Absolute: 0.7 10*3/uL (ref 0.1–1.0)
Monocytes Relative: 9 %
Neutro Abs: 4.5 10*3/uL (ref 1.7–7.7)
Neutrophils Relative %: 58 %
Platelets: 139 10*3/uL — ABNORMAL LOW (ref 150–400)
RBC: 3.89 MIL/uL (ref 3.87–5.11)
RDW: 17.5 % — ABNORMAL HIGH (ref 11.5–15.5)
WBC: 7.7 10*3/uL (ref 4.0–10.5)
nRBC: 0 % (ref 0.0–0.2)

## 2020-02-12 NOTE — Telephone Encounter (Signed)
Please refer to [email or web address].  Also, she can have her port removed.  Please ask Sharyn Lull to get her on [facility] schedule for port removal.    Thanks,   Mendel Ryder

## 2020-02-12 NOTE — Telephone Encounter (Signed)
Faxed referral to Dr. Irene Limbo at [phone]. Received confirmation.

## 2020-02-12 NOTE — Telephone Encounter (Signed)
Sharyn Lull at Dr. Iran Planas office called and left a message. Jensen has [employer] and her insurance is out of network. She would like to be referred to someone in her network.

## 2020-02-12 NOTE — Telephone Encounter (Signed)
She called and left a message. She forgot to mention at appt today that she would like to have her port removed if possible. She is asking for a referral to someone in network if possible. Dr. Iran Planas is out of her network.

## 2020-02-13 ENCOUNTER — Telehealth: Payer: Self-pay | Admitting: Adult Health

## 2020-02-13 ENCOUNTER — Encounter: Payer: Self-pay | Admitting: Licensed Clinical Social Worker

## 2020-02-13 NOTE — Telephone Encounter (Signed)
Scheduled appts per 4/28 los. Left voicemail with appt date and time.

## 2020-02-13 NOTE — Progress Notes (Signed)
[facility] Quality of Life Screening Clinical Social Work  Clinical Social Work was referred by survivorship quality of life screening protocol.  The patient scored a 32.19 on the Quality of Life/ Cancer Survivor (QOL-CS) scale which indicates moderate quality of life.   Clinical Social Worker contacted patient by phone to assess for needs. Based on screener and patient report, anxiety, isolation, and employment/finances are concerns in addition to physical side effects that are being addressed by medical team. Patient was terminated from her job at the time of her last visit. She is currently on short-term disability and does not feel ready to return to work. Regarding anxiety, she has started seeing a therapist and is working on Forensic psychologist, such as deep breathing. Discussed common themes in survivorship including fear of recurrence, psychological distress, difficulty returning to work, and readiness/ role expectation mismatch.   CSW provided supportive listening, information on [employer], and signed patient up, with permission, for [facility] class.    Follow up plan: 1. Patient to continue therapy sessions to help with anxiety and mental well-being. Patient to attend [facility] 2. Patient to complete [employer] application and send back to this CSW 3. Follow-up QOL screen to be sent in 1 months.     Nikka Hakimian E, LCSW

## 2020-02-14 ENCOUNTER — Telehealth: Payer: Self-pay

## 2020-02-14 ENCOUNTER — Other Ambulatory Visit: Payer: Self-pay

## 2020-02-14 DIAGNOSIS — C50311 Malignant neoplasm of lower-inner quadrant of right female breast: Secondary | ICD-10-CM

## 2020-02-14 NOTE — Telephone Encounter (Signed)
Pt notified of results, verbalized understanding.

## 2020-02-14 NOTE — Telephone Encounter (Signed)
-----   Message from Gardenia Phlegm, NP sent at 02/14/2020 10:50 AM EDT ----- Regarding: FW: x-ray results Not sure if this normal result was called to patient.  I sent it to be called on 4/30.   ----- Message ----- From: Kelli Hope, LPN Sent: 8/97/9150   3:35 PM EDT To: Gardenia Phlegm, NP Subject: x-ray results                                   ----- Message ----- From: Rennis Harding, RN Sent: 02/13/2020  12:36 PM EDT To: Kelli Hope, LPN   ----- Message ----- From: Gardenia Phlegm, NP Sent: 02/12/2020   6:58 PM EDT To: [facility]  Please let patient know that her chest xray was normal ----- Message ----- From: Interface, Rad Results In Sent: 02/12/2020   4:54 PM EDT To: Gardenia Phlegm, NP

## 2020-02-17 ENCOUNTER — Telehealth (HOSPITAL_COMMUNITY): Payer: Self-pay | Admitting: Vascular Surgery

## 2020-02-17 ENCOUNTER — Encounter: Payer: Self-pay | Admitting: Licensed Clinical Social Worker

## 2020-02-17 ENCOUNTER — Telehealth: Payer: Self-pay | Admitting: Internal Medicine

## 2020-02-17 NOTE — Telephone Encounter (Signed)
Left pt message giving f.u appt w/ db 5/17 @ 2:40 asked pt to call back to confirm appt

## 2020-02-17 NOTE — Telephone Encounter (Signed)
Received a new referral from [facility] [id number] cancer patient with positive cognitive dysfunction screen. [name] has been cld and scheduled to see Dr. Mickeal Skinner on 5/7 at 12pm. Pt aware to arrive 15 minutes early.

## 2020-02-17 NOTE — Progress Notes (Signed)
[name] CSW Progress Note  Clinical Education officer, museum received completed [employer] application from patient via email. Application and medical letter faxed to [employer] for consideration. They will contact patient directly regarding decision. Patient notified.     [name] , LCSW

## 2020-02-18 ENCOUNTER — Telehealth: Payer: Self-pay | Admitting: *Deleted

## 2020-02-18 ENCOUNTER — Other Ambulatory Visit: Payer: Self-pay | Admitting: Primary Care

## 2020-02-18 NOTE — Telephone Encounter (Signed)
Please notify patient that she actually has a refill from her last course of prednisone that was prescribed by Dr. Tommy Medal in late March. Have her call the pharmacy to have this refilled. Have her notify us if she has any problems.

## 2020-02-18 NOTE — Telephone Encounter (Signed)
Yes, absolutely, but we have to wait to start the daily preventative medication until her current flare resolves. If she starts the preventative medication during a flare then it will make the flare much worse. Have her notify me when her flare has resolved.

## 2020-02-18 NOTE — Telephone Encounter (Signed)
Spoken and notified patient of [name] comments. Patient stated that she aware of the prednisone. However, she asking for Rx to take daily to prevent gout. Please advise.

## 2020-02-18 NOTE — Telephone Encounter (Signed)
Spoken and notified patient of [name] Clark's comments. Patient verbalized understanding.  

## 2020-02-18 NOTE — Telephone Encounter (Signed)
Patient called stating that she is having another flare up with gout.  Patient stated that she is having cramps in her toe on her left foot, some redness and the bone on the side is sensitive to the touch. Patient stated her toe is feeling similar to the way it felt the last time she had gout.Paitent is requesting that medication be sent to the pharmacy for gout. Pharmacy [facility]

## 2020-02-20 ENCOUNTER — Ambulatory Visit (HOSPITAL_COMMUNITY)
Admission: RE | Admit: 2020-02-20 | Discharge: 2020-02-20 | Disposition: A | Discharge status: Home / Self Care | Payer: 59 | Source: Ambulatory Visit | Attending: Adult Health | Admitting: Adult Health

## 2020-02-20 ENCOUNTER — Other Ambulatory Visit: Payer: Self-pay

## 2020-02-20 DIAGNOSIS — C50311 Malignant neoplasm of lower-inner quadrant of right female breast: Secondary | ICD-10-CM | POA: Diagnosis not present

## 2020-02-20 DIAGNOSIS — Z170 Estrogen receptor positive status [ER+]: Secondary | ICD-10-CM

## 2020-02-20 DIAGNOSIS — K219 Gastro-esophageal reflux disease without esophagitis: Secondary | ICD-10-CM | POA: Insufficient documentation

## 2020-02-20 DIAGNOSIS — E785 Hyperlipidemia, unspecified: Secondary | ICD-10-CM | POA: Insufficient documentation

## 2020-02-20 DIAGNOSIS — D869 Sarcoidosis, unspecified: Secondary | ICD-10-CM | POA: Insufficient documentation

## 2020-02-20 DIAGNOSIS — I10 Essential (primary) hypertension: Secondary | ICD-10-CM | POA: Diagnosis not present

## 2020-02-20 DIAGNOSIS — E119 Type 2 diabetes mellitus without complications: Secondary | ICD-10-CM | POA: Diagnosis not present

## 2020-02-20 DIAGNOSIS — Z01818 Encounter for other preprocedural examination: Secondary | ICD-10-CM | POA: Diagnosis present

## 2020-02-20 NOTE — Progress Notes (Signed)
  Echocardiogram 2D Echocardiogram has been performed.  Jabar Krysiak G Katrell Milhorn 02/20/2020, 11:00 AM

## 2020-02-21 ENCOUNTER — Telehealth: Payer: Self-pay

## 2020-02-21 ENCOUNTER — Inpatient Hospital Stay: Payer: 59 | Attending: Adult Health | Admitting: Internal Medicine

## 2020-02-21 ENCOUNTER — Other Ambulatory Visit: Payer: Self-pay

## 2020-02-21 DIAGNOSIS — Z780 Asymptomatic menopausal state: Secondary | ICD-10-CM | POA: Insufficient documentation

## 2020-02-21 DIAGNOSIS — Z170 Estrogen receptor positive status [ER+]: Secondary | ICD-10-CM | POA: Diagnosis not present

## 2020-02-21 DIAGNOSIS — Z21 Asymptomatic human immunodeficiency virus [HIV] infection status: Secondary | ICD-10-CM | POA: Diagnosis not present

## 2020-02-21 DIAGNOSIS — R4189 Other symptoms and signs involving cognitive functions and awareness: Secondary | ICD-10-CM | POA: Insufficient documentation

## 2020-02-21 DIAGNOSIS — F419 Anxiety disorder, unspecified: Secondary | ICD-10-CM | POA: Diagnosis not present

## 2020-02-21 DIAGNOSIS — F09 Unspecified mental disorder due to known physiological condition: Secondary | ICD-10-CM | POA: Insufficient documentation

## 2020-02-21 DIAGNOSIS — Z801 Family history of malignant neoplasm of trachea, bronchus and lung: Secondary | ICD-10-CM | POA: Insufficient documentation

## 2020-02-21 DIAGNOSIS — Z803 Family history of malignant neoplasm of breast: Secondary | ICD-10-CM | POA: Diagnosis not present

## 2020-02-21 DIAGNOSIS — Z87891 Personal history of nicotine dependence: Secondary | ICD-10-CM | POA: Insufficient documentation

## 2020-02-21 DIAGNOSIS — C50311 Malignant neoplasm of lower-inner quadrant of right female breast: Secondary | ICD-10-CM | POA: Insufficient documentation

## 2020-02-21 DIAGNOSIS — G629 Polyneuropathy, unspecified: Secondary | ICD-10-CM | POA: Diagnosis not present

## 2020-02-21 HISTORY — DX: Unspecified mental disorder due to known physiological condition: F09

## 2020-02-21 MED ORDER — GABAPENTIN 300 MG PO CAPS: 300.0000 mg | ORAL_CAPSULE | Freq: Three times a day (TID) | ORAL | 3 refills | Status: DC

## 2020-02-21 NOTE — Progress Notes (Signed)
[facility] [place], [place] [address] [phone]   Cognitive Survivorship Evaluation  Date of Service: 02/21/20 Patient Name: [name] Patient MRN: [id number] Patient DOB: 04-25-1966 Provider: Ventura Sellers, MD  Identifying Statement:  [name] is a 54 y.o. female who presents for initial consultation and evaluation regarding cancer associated cognitive decline.    Referring Provider: [name], NP [address],  [place] [id number]  Primary Cancer:  Oncologic History: Oncology History  Malignant neoplasm of lower-inner quadrant of right breast of female, estrogen receptor positive ([place])  05/10/2018 Initial Diagnosis   Malignant neoplasm of lower-inner quadrant of right breast of female, estrogen receptor positive ([place])   05/31/2018 - 11/12/2018 Chemotherapy   palonosetron (ALOXI) injection 0.25 mg, 0.25 mg, Intravenous,  Once, 6 of 6 cycles. Administration: 0.25 mg (05/31/2018), 0.25 mg (06/21/2018), 0.25 mg (08/23/2018), 0.25 mg (09/14/2018), 0.25 mg (07/12/2018), 0.25 mg (08/02/2018)  pegfilgrastim-cbqv (UDENYCA) injection 6 mg, 6 mg, Subcutaneous, Once, 6 of 6 cycles. Administration: 6 mg (06/02/2018), 6 mg (06/23/2018), 6 mg (08/25/2018), 6 mg (09/17/2018), 6 mg (07/14/2018), 6 mg (08/04/2018)  trastuzumab (HERCEPTIN) 900 mg in sodium chloride 0.9 % 250 mL chemo infusion, 903 mg, Intravenous,  Once, 7 of 7 cycles Administration: 672 mg (06/21/2018), 672 mg (08/23/2018), 672 mg (09/14/2018), 900 mg (06/01/2018), 672 mg (07/12/2018), 672 mg (08/02/2018), 672 mg (10/23/2018)  CARBOplatin (PARAPLATIN) 580 mg in sodium chloride 0.9 % 250 mL chemo infusion, 580 mg (98.8 % of original dose 588 mg), Intravenous,  Once, 6 of 6 cycles. Dose modification:   (original dose 588 mg, Cycle 1). Administration: 580 mg (05/31/2018), 500 mg (06/21/2018), 560 mg (08/23/2018), 560 mg (09/14/2018), 530 mg (07/12/2018), 570  mg (08/02/2018)  DOCEtaxel (TAXOTERE) 170 mg in sodium chloride 0.9 % 250 mL chemo infusion, 75 mg/m2 = 170 mg, Intravenous,  Once, 4 of 4 cycles. Administration: 170 mg (05/31/2018), 170 mg (06/21/2018), 170 mg (07/12/2018), 170 mg (08/02/2018)  gemcitabine (GEMZAR) 1,824 mg in sodium chloride 0.9 % 100 mL chemo infusion, 800 mg/m2 = 1,824 mg (100 % of original dose 800 mg/m2), Intravenous,  Once, 2 of 2 cycles. Dose modification: 800 mg/m2 (original dose 800 mg/m2, Cycle 5, Reason: Provider Judgment). Administration: 1,824 mg (08/23/2018), 1,824 mg (09/14/2018)  pertuzumab (PERJETA) 840 mg in sodium chloride 0.9 % 250 mL chemo infusion, 840 mg, Intravenous, Once, 1 of 1 cycle Administration: 840 mg (06/01/2018)  Fosaprepitant (EMEND) 150 mg  dexamethasone (DECADRON) 12 mg in sodium chloride 0.9 % 145 mL IVPB, , Intravenous,  Once, 6 of 6 cycles. Administration:  (05/31/2018),  (06/21/2018),  (08/23/2018),  (09/14/2018),  (07/12/2018),  (08/02/2018)   07/25/2018 Genetic Testing   Genetic testing performed through [facility] [employer] reported out on 07/24/2018 showed no pathogenic mutations. Genes Analyzed (43 total): APC*, ATM*, AXIN2, BARD1, BMPR1A, BRCA1*, BRCA2*, BRIP1*, CDH1*, CDK4, CDKN2A, CHEK2*, DICER1,GALNT12, HOXB13, MEN1, MLH1*, MRE11A, MSH2*, MSH3, MSH6*, MUTYH*, NBN, NF1*, NTHL1, PALB2*, PMS2*, POLD1, POLE,PTEN*, RAD50, RAD51C*, RAD51D*, RET, SDHB, SDHD, SMAD4, SMARCA4, STK11 and TP53* (sequencing and deletion/duplication); EGFR (sequencing only); EPCAM and GREM1 (deletion/duplication only). DNA and RNA analyses performed for * genes.   10/03/2018 Surgery   Right lumpectomy ([facility]) [phone]): residual ypT1c ypN0 invasive ductal carcinoma, grade 2, with negative margins; a total of 5 sentinel lymph nodes removed. Triple positive.   11/13/2018 - 11/24/2019 Chemotherapy   ado-trastuzumab emtansine (KADCYLA) 360 mg in sodium chloride 0.9 % 250 mL  chemo infusion, 380 mg, Intravenous,  Once, 16 of 16 cycles. Administration: 360 mg (11/13/2018), 360 mg (12/06/2018), 360 mg (02/07/2019), 360 mg (12/27/2018), 360 mg (01/17/2019), 360 mg (02/28/2019), 360 mg (04/12/2019), 360 mg (03/22/2019), 360 mg (05/03/2019), 360 mg (07/02/2019), 360 mg (08/12/2019), 360 mg (09/02/2019), 360 mg (09/23/2019), 360 mg (10/14/2019), 360 mg (11/04/2019)   01/07/2019 - 02/21/2019 Radiation Therapy   The patient initially received a dose of 50.4 Gy in 28 fractions to the right breast using whole-breast tangent fields. This was delivered using a 3-D conformal technique. The patient then received a boost to the seroma. This delivered an additional 10 Gy in 5 fractions using 12e electrons with a special teletherapy technique. The total dose was 60.4 Gy.   02/2019 - 02/2024 Anti-estrogen oral therapy   Tamoxifen     History of Present Illness: The patient's records from the referring physician were obtained and reviewed and the patient interviewed to confirm this HPI.  [name] presents today to discuss cognitive changes since beginning chemotherapy treatment for breast cancer.  Treatment consisted of cytotoxic chemotherapy and hormonal therapy. She describes modest impairment in attention, processing speed and short term memory.  There is increased anxiety and mood lability as well. It is more difficult for her to work because of cognitive issues.  She does describe painful neuropathy in lower legs and hands since her treatment, gabapentin is only modestly effective. Otherwise denies focal complaints, no seizures, headaches.  Medications: Current Outpatient Medications on File Prior to Visit  Medication Sig Dispense Refill  . Blood Glucose Monitoring Suppl (ONETOUCH VERIO) w/Device KIT USE AS INSTRUCTED TO TEST BLOOD SUGAR UP TO 4 TIMES DAILY E11.9 1 kit 0  . dolutegravir ([name]) 50 MG tablet Take 1 tablet (50 mg total) by mouth daily. 30 tablet 2  . doxycycline (VIBRA-TABS) 100 MG tablet Take 1 tablet (100 mg  total) by mouth 2 (two) times daily. 60 tablet 11  . Dulaglutide (TRULICITY) 4.09 WJ/1.9JY SOPN Inject 0.75 mg into the skin once a week. For diabetes. 2 mL 3  . emtricitabine-tenofovir AF (DESCOVY) 200-25 MG tablet Take 1 tablet by mouth daily. 30 tablet 2  . FLOVENT HFA 110 MCG/ACT inhaler TAKE 1 PUFF BY MOUTH TWICE A DAY 36 Inhaler 1  . gabapentin (NEURONTIN) 100 MG capsule Take 300 mg by mouth at bedtime.    Marland Kitchen glipiZIDE (GLUCOTROL) 5 MG tablet TAKE 1 TABLET (5 MG TOTAL) BY MOUTH 2 (TWO) TIMES DAILY BEFORE A MEAL. FOR DIABETES. 180 tablet 1  . glucose blood (ONETOUCH VERIO) test strip USE AS INSTRUCTED TO TEST BLOOD SUGAR DAILY 100 each 2  . irbesartan-hydrochlorothiazide (AVALIDE) 150-12.5 MG tablet TAKE 1 TABLET BY MOUTH EVERY DAY FOR BLOOD PRESSURE 90 tablet 0  . Lancets (ONETOUCH ULTRASOFT) lancets Use as instructed to test blood sugar daily 100 each 5  . levothyroxine (SYNTHROID) 200 MCG tablet Take 1 tablet by mouth every morning on an empty stomach with water only.  No food or other medications for 30 minutes. 30 tablet 1  . magic mouthwash SOLN Take 5 mLs by mouth 3 (three) times daily as needed for mouth pain. 150 mL 2  . metoprolol succinate (TOPROL-XL) 50 MG 24 hr tablet TAKE 1 TABLET (50 MG TOTAL) BY MOUTH DAILY. TAKE WITH OR IMMEDIATELY FOLLOWING A MEAL. 90 tablet 0  . nystatin (MYCOSTATIN) 100000 UNIT/ML suspension     . omeprazole (PRILOSEC) 40 MG capsule TAKE 1 CAPSULE BY MOUTH EVERY DAY 90 capsule 1  .  potassium chloride SA (KLOR-CON) 20 MEQ tablet Take 20 mEq by mouth daily.    . rosuvastatin (CRESTOR) 10 MG tablet TAKE 1 TABLET BY MOUTH EVERY DAY FOR CHOLESTEROL 90 tablet 3  . spironolactone (ALDACTONE) 25 MG tablet Take 25 mg by mouth daily.    . tamoxifen (NOLVADEX) 20 MG tablet Take 1 tablet (20 mg total) by mouth daily. 90 tablet 1  . [DISCONTINUED] prochlorperazine (COMPAZINE) 10 MG tablet TAKE 1 TABLET BY MOUTH EVERY 6 HOURS AS NEEDED FOR NAUSEA OR VOMITING 30 tablet 2     Current Facility-Administered Medications on File Prior to Visit  Medication Dose Route Frequency Provider Last Rate Last Admin  . sodium chloride flush (NS) 0.9 % injection 10 mL  10 mL Intracatheter PRN Magrinat, Virgie Dad, MD   10 mL at 09/19/18 1551    Allergies:  Allergies  Allergen Reactions  . Genvoya [Elviteg-Cobic-Emtricit-Tenofaf] Hives  . Lisinopril Cough  . Valsartan Other (See Comments) and Cough    Pt states she tolerates medicine now   Past Medical History:  Past Medical History:  Diagnosis Date  . Allergy   . Anemia    Normocytic  . Anxiety   . Asthma   . Blood dyscrasia   . Bronchitis 2005  . 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 Diabetes mellitus without complication ([place])   . Family history of breast cancer   . Family history of lung cancer   . Family history of prostate cancer   . Gastroenteritis 07/10/07  . GERD 07/24/2006  . GRAVE'S DISEASE 01/01/2008  . History of hidradenitis suppurativa   . History of kidney stones   . History of thrush   . HIV DISEASE 07/24/2006   dx March 05  . Hyperlipidemia   . HYPERTENSION 07/24/2006  . Hyperthyroidism 08/2006   Grave's Disease -diffuse radiotracer uptake 08/25/06 Thyroid scan-Cold nodule to R lower lobe of thyrorid  . Menometrorrhagia    hx of  . Nephrolithiasis   . 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 Personal history of chemotherapy    2020  . Personal history of radiation therapy    2020  . Pneumonia 2005  . 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 Sarcoidosis 02/08/2007   dx as a teenager in [place] from abnl CXR. Completed 2 yrs Prednisone after lung bx confirmation. No symptoms since then.  . Suppurative hidradenitis   . Thyroid cancer ([place])   . THYROID NODULE, RIGHT 02/08/2007   Past Surgical History:  Past Surgical History:  Procedure Laterality Date  . BREAST EXCISIONAL BIOPSY Right 04/26/2018   right axilla  negative  . BREAST EXCISIONAL BIOPSY Left 04/26/2018   left axilla negative  . BREAST LUMPECTOMY Right 10/03/2018   malignant  . BREAST LUMPECTOMY WITH RADIOACTIVE SEED AND SENTINEL LYMPH NODE BIOPSY Right 10/03/2018   Procedure: RIGHT BREAST LUMPECTOMY WITH RADIOACTIVE SEED AND SENTINEL LYMPH NODE MAPPING;  Surgeon: Erroll Luna, MD;  Location: [facility];  Service: General;  Laterality: Right;  . BREAST SURGERY  1997   Breast Reduction   . CYSTOSCOPY W/ URETERAL STENT REMOVAL  11/09/2012   Procedure: CYSTOSCOPY WITH STENT REMOVAL;  Surgeon: Alexis Frock, MD;  Location: [facility];  Service: Urology;  Laterality: Right;  . CYSTOSCOPY WITH RETROGRADE PYELOGRAM, URETEROSCOPY AND STENT PLACEMENT  11/09/2012   Procedure: CYSTOSCOPY WITH RETROGRADE PYELOGRAM, URETEROSCOPY AND STENT PLACEMENT;  Surgeon: Alexis Frock, MD;  Location: [facility];  Service: Urology;  Laterality: Left;  LEFT URETEROSCOPY,  STONE MANIPULATION, left STENT exchange   . CYSTOSCOPY WITH STENT PLACEMENT  10/02/2012   Procedure: CYSTOSCOPY WITH STENT PLACEMENT;  Surgeon: Alexis Frock, MD;  Location: [facility];  Service: Urology;  Laterality: Left;  . DILATION AND CURETTAGE OF UTERUS  Feb 2004   s/p for 1st trimester nonviable pregnancy  . EYE SURGERY     sty under eyelid  . INCISE AND DRAIN ABCESS  Nov 03   s/p I &D for righ inframmary fold hidradenitis  . INCISION AND DRAINAGE PERITONSILLAR ABSCESS  Mar 03  . IR CV LINE INJECTION  06/07/2018  . IR IMAGING GUIDED PORT INSERTION  06/20/2018  . IR REMOVAL TUN ACCESS W/ PORT W/O FL MOD SED  06/20/2018  . IRRIGATION AND DEBRIDEMENT ABSCESS  01/31/2012   Procedure: IRRIGATION AND DEBRIDEMENT ABSCESS;  Surgeon: Shann Medal, MD;  Location: [facility];  Service: General;  Laterality: Right;  right breast and axilla   . NEPHROLITHOTOMY  10/02/2012   Procedure: NEPHROLITHOTOMY PERCUTANEOUS;  Surgeon: Alexis Frock, MD;  [facility];  Service: Urology;  Laterality: Right;  First Stage  Percutaneous Nephrolithotomy with Surgeon Access, Left Ureteral Stent    . NEPHROLITHOTOMY  10/04/2012   Procedure: NEPHROLITHOTOMY PERCUTANEOUS SECOND LOOK;  Surgeon: Alexis Frock, MD;  [facility];  Service: Urology;  Laterality: Right;     . NEPHROLITHOTOMY  10/08/2012   Procedure: NEPHROLITHOTOMY PERCUTANEOUS;  Surgeon: Alexis Frock, MD;  [facility];  Service: Urology;  Laterality: Right;  THIRD STAGE, nephrostomy tube exchange x 2  . NEPHROLITHOTOMY  10/11/2012   Procedure: NEPHROLITHOTOMY PERCUTANEOUS SECOND LOOK;  Surgeon: Alexis Frock, MD;  [facility];  Service: Urology;  Laterality: Right;  RIGHT 4 STAGE PERCUTANOUS NEPHROLITHOTOMY, right URETEROSCOPY WITH HOLMIUM LASER   . PORTACATH PLACEMENT Left 05/17/2018   Procedure: INSERTION PORT-A-CATH;  Surgeon: Coralie Keens, MD;  Location: [facility];  Service: General;  Laterality: Left;  . RADICAL NECK DISSECTION  04/12/2017   limited/notes 04/12/2017  . RADICAL NECK DISSECTION N/A 04/12/2017   Procedure: RADICAL NECK DISSECTION;  Surgeon: Melida Quitter, MD;  Location: [facility];  Service: ENT;  Laterality: N/A;  limited neck dissection 2 hours total  . REDUCTION MAMMAPLASTY Bilateral 1998  . [place]  1994  . THYROIDECTOMY  04/12/2017   completion/notes 04/12/2017  . THYROIDECTOMY N/A 04/12/2017   Procedure: THYROIDECTOMY;  Surgeon: Melida Quitter, MD;  Location: [facility];  Service: ENT;  Laterality: N/A;  Completion Thyroidectomy  . TOTAL THYROIDECTOMY  2010   Social History:  Social History   Socioeconomic History  . Marital status: Single    Spouse name: Not on file  . Number of children: 1  . Years of education: Not on file  . Highest education level: Not on file  Occupational History  . Occupation: Secondary school teacher  Tobacco Use  . Smoking status: Former Smoker    Packs/day: 0.50    Years: 15.00    Pack years: 7.50    Types: Cigarettes    Start date: 04/12/2017  . Smokeless tobacco: Never  Used  . Tobacco comment: quit smoking  may 2018  Substance and Sexual Activity  . Alcohol use: Yes    Alcohol/week: 0.0 standard drinks    Comment: social  . Drug use: No  . Sexual activity: Not Currently    Birth control/protection: Post-menopausal    Comment: declined condoms  Other Topics Concern  . Not on file  Social History Narrative  . Not on file  Social Determinants of Health   Financial Resource Strain:   . Difficulty of Paying Living Expenses:   Food Insecurity:   . Worried About Charity fundraiser in the Last Year:   . Arboriculturist in the Last Year:   Transportation Needs:   . Film/video editor (Medical):   Marland Kitchen Lack of Transportation (Non-Medical):   Physical Activity:   . Days of Exercise per Week:   . Minutes of Exercise per Session:   Stress:   . Feeling of Stress :   Social Connections:   . Frequency of Communication with Friends and Family:   . Frequency of Social Gatherings with Friends and Family:   . Attends Religious Services:   . Active Member of Clubs or Organizations:   . Attends Archivist Meetings:   Marland Kitchen Marital Status:   Intimate Partner Violence:   . Fear of Current or Ex-Partner:   . Emotionally Abused:   Marland Kitchen Physically Abused:   . Sexually Abused:    Family History:  Family History  Problem Relation Age of Onset  . Hypertension Mother   . Cancer Mother        laryngeal  . Heart disease Mother        stent  . Hypertension Father   . Lung cancer Father 82       hx smoking  . Heart disease Other   . Hypertension Other   . Stroke Other        Grandparent  . Kidney disease Other        Grandparent  . Diabetes Other        FH of Diabetes  . Hypertension Sister   . Cancer Maternal Uncle        Lung CA  . Hypertension Brother   . Hypertension Sister   . Breast cancer Maternal Aunt 65  . Breast cancer Paternal Aunt 40  . Prostate cancer Paternal Uncle   . Breast cancer Maternal Aunt        dx 60+  . Breast  cancer Paternal Aunt        dx 50's  . Breast cancer Paternal Aunt        dx 24's  . Prostate cancer Paternal Uncle   . Lung cancer Paternal Uncle   . Breast cancer Cousin 9  . Breast cancer Cousin        dx <50  . Breast cancer Cousin        dx <50  . Breast cancer Cousin        dx <50  . Colon cancer Neg Hx   . Esophageal cancer Neg Hx   . Rectal cancer Neg Hx   . Stomach cancer Neg Hx     Review of Systems: Constitutional: Doesn't report fevers, chills or abnormal weight loss Eyes: Doesn't report blurriness of vision Ears, nose, mouth, throat, and face: Doesn't report sore throat Respiratory: Doesn't report cough, dyspnea or wheezes Cardiovascular: Doesn't report palpitation, chest discomfort  Gastrointestinal:  Doesn't report nausea, constipation, diarrhea GU: Doesn't report incontinence Skin: Doesn't report skin rashes Neurological: Per HPI Musculoskeletal: Doesn't report joint pain Behavioral/Psych: +anxiety  Physical Exam: Vitals:   02/21/20 1219  BP: (!) 187/87  Pulse: 80  Resp: 18  Temp: 98 F (36.7 C)  SpO2: 97%   General: Alert, cooperative, pleasant, in no acute distress Head: Normal EENT: No conjunctival injection or scleral icterus.  Lungs: Resp effort normal Cardiac: Regular rate Abdomen: Non-distended  abdomen Skin: No rashes cyanosis or petechiae. Extremities: No clubbing or edema  Neurologic Exam: Mental Status: Awake, alert, attentive to examiner. Oriented to self and environment. Language is fluent with intact comprehension.  Cranial Nerves: Visual acuity is grossly normal. Visual fields are full. Extra-ocular movements intact. No ptosis. Face is symmetric Motor: Tone and bulk are normal. Power is full in both arms and legs.  Sensory: Stocking sensory changes Gait: Normal.   Labs: I have reviewed the data as listed    Component Value Date/Time   NA 140 02/12/2020 1017   K 3.5 02/12/2020 1017   CL 101 02/12/2020 1017   CO2 30  02/12/2020 1017   GLUCOSE 236 (H) 02/12/2020 1017   BUN 22 (H) 02/12/2020 1017   CREATININE 1.72 (H) 02/12/2020 1017   CREATININE 1.98 (H) 08/12/2019 1447   CREATININE 1.02 09/23/2015 1047   CALCIUM 9.3 02/12/2020 1017   PROT 7.6 02/12/2020 1017   ALBUMIN 2.9 (L) 02/12/2020 1017   AST 34 02/12/2020 1017   AST 24 08/12/2019 1447   ALT 26 02/12/2020 1017   ALT 16 08/12/2019 1447   ALKPHOS 90 02/12/2020 1017   BILITOT 0.2 (L) 02/12/2020 1017   BILITOT 0.2 (L) 08/12/2019 1447   GFRNONAA 34 (L) 02/12/2020 1017   GFRNONAA 29 (L) 08/12/2019 1447   GFRAA 39 (L) 02/12/2020 1017   GFRAA 33 (L) 08/12/2019 1447   Lab Results  Component Value Date   WBC 7.7 02/12/2020   NEUTROABS 4.5 02/12/2020   HGB 12.1 02/12/2020   HCT 36.4 02/12/2020   MCV 93.6 02/12/2020   PLT 139 (L) 02/12/2020     Assessment/Plan:  Cognitive Changes  [name] presents with 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   We provided counseling regarding healthy behaviors to maintain cognitive function, including exercise, diet, and positive outlook.  We discussed mindful relaxation and provided some methods to redirect anxiety without medication.   For neuropathy we recommended increasing Gabapentin to '300mg'$  up to TID.  No further CNS workup or cognitive screening recommended at this time.  We spent twenty additional minutes teaching regarding the natural history, biology, and historical experience in the treatment of neurologic complications of cancer.   We appreciate the opportunity to participate in the care of [facility].  We encouraged follow up for progressive cognitive issues or neuropathic symptoms if they develop.  All questions were answered. The patient knows to call the clinic with any problems, questions or concerns. No barriers to learning were detected.  The total  time spent in the encounter was 40 minutes and more than 50% was on counseling and review of test results   Ventura Sellers, MD Medical Director of Neuro-Oncology [facility] [place] 02/21/20 4:59 PM

## 2020-02-21 NOTE — Telephone Encounter (Signed)
COVID-19 Pre-Screening Questions:02/21/20  Do you currently have a fever (>100 F), chills or unexplained body aches? NO   Are you currently experiencing new cough, shortness of breath, sore throat, runny nose?NO  .  Have you recently travelled outside the state of [place] in the last 14 days?NO .  Have you been in contact with someone that is currently pending confirmation of Covid19 testing or has been confirmed to have the [place] virus?  NO   **If the patient answers NO to ALL questions -  advise the patient to please call the clinic before coming to the office should any symptoms develop.

## 2020-02-24 ENCOUNTER — Other Ambulatory Visit: Payer: Self-pay

## 2020-02-24 ENCOUNTER — Ambulatory Visit (INDEPENDENT_AMBULATORY_CARE_PROVIDER_SITE_OTHER): Payer: 59 | Admitting: Internal Medicine

## 2020-02-24 DIAGNOSIS — F09 Unspecified mental disorder due to known physiological condition: Secondary | ICD-10-CM

## 2020-02-24 DIAGNOSIS — Z95828 Presence of other vascular implants and grafts: Secondary | ICD-10-CM | POA: Diagnosis not present

## 2020-02-24 DIAGNOSIS — B20 Human immunodeficiency virus [HIV] disease: Secondary | ICD-10-CM
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
## 2020-02-24 NOTE — Assessment & Plan Note (Signed)
Her infection remains under excellent, long-term control.  She will continue Descovy and [name] and follow-up after lab work in 6 months.

## 2020-02-24 NOTE — Assessment & Plan Note (Signed)
I suspect that her cognitive dysfunction is multifactorial.  She may have components of chemo brain but is also impacted by her anxiety, depression and poor sleep.  We will reschedule her visit with our behavioral health counselor.

## 2020-02-24 NOTE — Assessment & Plan Note (Signed)
She now has gotten her medical insurance back.  Someone at the cancer center is going to arrange a time to have her Port-A-Cath removed.

## 2020-02-25 ENCOUNTER — Other Ambulatory Visit: Payer: Self-pay

## 2020-02-25 DIAGNOSIS — B20 Human immunodeficiency virus [HIV] disease: Secondary | ICD-10-CM

## 2020-02-25 MED ORDER — DESCOVY 200-25 MG PO TABS: 1.0000 | ORAL_TABLET | Freq: Every day | ORAL | 2 refills | Status: DC

## 2020-02-25 MED ORDER — TIVICAY 50 MG PO TABS: 50.0000 mg | ORAL_TABLET | Freq: Every day | ORAL | 2 refills | Status: DC

## 2020-02-28 ENCOUNTER — Ambulatory Visit (INDEPENDENT_AMBULATORY_CARE_PROVIDER_SITE_OTHER): Payer: 59 | Admitting: Plastic Surgery

## 2020-02-28 ENCOUNTER — Other Ambulatory Visit: Payer: Self-pay

## 2020-02-28 ENCOUNTER — Encounter: Payer: Self-pay | Admitting: Plastic Surgery

## 2020-02-28 VITALS — BP 158/101 | HR 83 | Temp 99.0°F | Ht 65.0 in | Wt 236.6 lb

## 2020-02-28 DIAGNOSIS — C50311 Malignant neoplasm of lower-inner quadrant of right female breast: Secondary | ICD-10-CM | POA: Diagnosis not present

## 2020-02-28 DIAGNOSIS — N6489 Other specified disorders of breast: Secondary | ICD-10-CM | POA: Diagnosis not present

## 2020-02-28 DIAGNOSIS — Z170 Estrogen receptor positive status [ER+]: Secondary | ICD-10-CM

## 2020-02-28 NOTE — Progress Notes (Signed)
Patient ID: [name], female    DOB: [birth year], 54 y.o.   MRN: [id number]   Chief Complaint  Patient presents with  . Breast Problem    symmerty in breast    Patient is a 54 year old black female here for evaluation of her breasts.  The patient felt a lump in her right breast in July 2019.  She went on to have chemotherapy and radiation with lumpectomy of the right breast.  She was triple positive breast cancer.  She is 5 feet 5 inches tall and weighs 236.  Her breast size is 1 to 2 cup size difference with the left being larger.  She is unhappy with the way she looks in her bra and close with the breast size difference.  She has multiple medical conditions.  She has a history of diabetes, HIV, hidradenitis, hypertension, hyperlipidemia, thyroid disease and gout.  All of these issues are being managed.  She is at an extremely high rate of risk for complications.  She has signs of the scarring from the hidradenitis as well as from the radiation with hyperpigmentation of the right breast.   Review of Systems  Constitutional: Negative.  Negative for activity change and appetite change.  Eyes: Negative.   Respiratory: Negative.  Negative for chest tightness.   Cardiovascular: Negative.   Gastrointestinal: Negative for abdominal distention.  Genitourinary: Negative.   Musculoskeletal: Negative.   Hematological: Negative.     Past Medical History:  Diagnosis Date  . Allergy   . Anemia    Normocytic  . Anxiety   . Asthma   . Blood dyscrasia   . Bronchitis 2005  . 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 Diabetes mellitus without complication ([place])   . Family history of breast cancer   . Family history of lung cancer   . Family history of prostate cancer   . Gastroenteritis 07/10/07  . GERD 07/24/2006  . GRAVE'S DISEASE 01/01/2008  . History of hidradenitis suppurativa   . History of kidney stones   . History of thrush   . HIV DISEASE 07/24/2006   dx March 05    . Hyperlipidemia   . HYPERTENSION 07/24/2006  . Hyperthyroidism 08/2006   Grave's Disease -diffuse radiotracer uptake 08/25/06 Thyroid scan-Cold nodule to R lower lobe of thyrorid  . Menometrorrhagia    hx of  . Nephrolithiasis   . 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 Personal history of chemotherapy    2020  . Personal history of radiation therapy    2020  . Pneumonia 2005  . 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 Sarcoidosis 02/08/2007   dx as a teenager in [place] from abnl CXR. Completed 2 yrs Prednisone after lung bx confirmation. No symptoms since then.  . Suppurative hidradenitis   . Thyroid cancer ([place])   . THYROID NODULE, RIGHT 02/08/2007    Past Surgical History:  Procedure Laterality Date  . BREAST EXCISIONAL BIOPSY Right 04/26/2018   right axilla negative  . BREAST EXCISIONAL BIOPSY Left 04/26/2018   left axilla negative  . BREAST LUMPECTOMY Right 10/03/2018   malignant  . BREAST LUMPECTOMY WITH RADIOACTIVE SEED AND SENTINEL LYMPH NODE BIOPSY Right 10/03/2018   Procedure: RIGHT BREAST LUMPECTOMY WITH RADIOACTIVE SEED AND SENTINEL LYMPH NODE MAPPING;  Surgeon: Erroll Luna, MD;  Location: [facility];  Service: General;  Laterality: Right;  . BREAST SURGERY  1997   Breast Reduction   . CYSTOSCOPY W/ URETERAL STENT  REMOVAL  11/09/2012   Procedure: CYSTOSCOPY WITH STENT REMOVAL;  Surgeon: Alexis Frock, MD;  Location: [facility];  Service: Urology;  Laterality: Right;  . CYSTOSCOPY WITH RETROGRADE PYELOGRAM, URETEROSCOPY AND STENT PLACEMENT  11/09/2012   Procedure: CYSTOSCOPY WITH RETROGRADE PYELOGRAM, URETEROSCOPY AND STENT PLACEMENT;  Surgeon: Alexis Frock, MD;  Location: [facility];  Service: Urology;  Laterality: Left;  LEFT URETEROSCOPY, STONE MANIPULATION, left STENT exchange   . CYSTOSCOPY WITH STENT PLACEMENT  10/02/2012   Procedure: CYSTOSCOPY WITH STENT PLACEMENT;  Surgeon: Alexis Frock, MD;   Location: [facility];  Service: Urology;  Laterality: Left;  . DILATION AND CURETTAGE OF UTERUS  Feb 2004   s/p for 1st trimester nonviable pregnancy  . EYE SURGERY     sty under eyelid  . INCISE AND DRAIN ABCESS  Nov 03   s/p I &D for righ inframmary fold hidradenitis  . INCISION AND DRAINAGE PERITONSILLAR ABSCESS  Mar 03  . IR CV LINE INJECTION  06/07/2018  . IR IMAGING GUIDED PORT INSERTION  06/20/2018  . IR REMOVAL TUN ACCESS W/ PORT W/O FL MOD SED  06/20/2018  . IRRIGATION AND DEBRIDEMENT ABSCESS  01/31/2012   Procedure: IRRIGATION AND DEBRIDEMENT ABSCESS;  Surgeon: Shann Medal, MD;  Location: [facility];  Service: General;  Laterality: Right;  right breast and axilla   . NEPHROLITHOTOMY  10/02/2012   Procedure: NEPHROLITHOTOMY PERCUTANEOUS;  Surgeon: Alexis Frock, MD;  Location[facility];  Service: Urology;  Laterality: Right;  First Stage Percutaneous Nephrolithotomy with Surgeon Access, Left Ureteral Stent    . NEPHROLITHOTOMY  10/04/2012   Procedure: NEPHROLITHOTOMY PERCUTANEOUS SECOND LOOK;  Surgeon: Alexis Frock, MD;  [facility];  Service: Urology;  Laterality: Right;     . NEPHROLITHOTOMY  10/08/2012   Procedure: NEPHROLITHOTOMY PERCUTANEOUS;  Surgeon: Alexis Frock, MD;  Location[facility];  Service: Urology;  Laterality: Right;  THIRD STAGE, nephrostomy tube exchange x 2  . NEPHROLITHOTOMY  10/11/2012   Procedure: NEPHROLITHOTOMY PERCUTANEOUS SECOND LOOK;  Surgeon: Alexis Frock, MD;  Location[facility];  Service: Urology;  Laterality: Right;  RIGHT 4 STAGE PERCUTANOUS NEPHROLITHOTOMY, right URETEROSCOPY WITH HOLMIUM LASER   . PORTACATH PLACEMENT Left 05/17/2018   Procedure: INSERTION PORT-A-CATH;  Surgeon: Coralie Keens, MD;  Location: [facility];  Service: General;  Laterality: Left;  . RADICAL NECK DISSECTION  04/12/2017   limited/notes 04/12/2017  . RADICAL NECK DISSECTION N/A 04/12/2017   Procedure: RADICAL NECK DISSECTION;  Surgeon: Melida Quitter, MD;   Location: [facility];  Service: ENT;  Laterality: N/A;  limited neck dissection 2 hours total  . REDUCTION MAMMAPLASTY Bilateral 1998  . [place]  1994  . THYROIDECTOMY  04/12/2017   completion/notes 04/12/2017  . THYROIDECTOMY N/A 04/12/2017   Procedure: THYROIDECTOMY;  Surgeon: Melida Quitter, MD;  Location: [facility];  Service: ENT;  Laterality: N/A;  Completion Thyroidectomy  . TOTAL THYROIDECTOMY  2010      Current Outpatient Medications:  .  Blood Glucose Monitoring Suppl (ONETOUCH VERIO) w/Device KIT, USE AS INSTRUCTED TO TEST BLOOD SUGAR UP TO 4 TIMES DAILY E11.9, Disp: 1 kit, Rfl: 0 .  dolutegravir ([name]) 50 MG tablet, Take 1 tablet (50 mg total) by mouth daily., Disp: 30 tablet, Rfl: 2 .  doxycycline (VIBRA-TABS) 100 MG tablet, Take 1 tablet (100 mg total) by mouth 2 (two) times daily., Disp: 60 tablet, Rfl: 11 .  Dulaglutide (TRULICITY) 0.35 WS/5.6CL SOPN, Inject 0.75 mg into the skin once a week. For diabetes., Disp: 2 mL,  Rfl: 3 .  emtricitabine-tenofovir AF (DESCOVY) 200-25 MG tablet, Take 1 tablet by mouth daily., Disp: 30 tablet, Rfl: 2 .  FLOVENT HFA 110 MCG/ACT inhaler, TAKE 1 PUFF BY MOUTH TWICE A DAY, Disp: 36 Inhaler, Rfl: 1 .  gabapentin (NEURONTIN) 300 MG capsule, Take 1 capsule (300 mg total) by mouth 3 (three) times daily., Disp: 90 capsule, Rfl: 3 .  glipiZIDE (GLUCOTROL) 5 MG tablet, TAKE 1 TABLET (5 MG TOTAL) BY MOUTH 2 (TWO) TIMES DAILY BEFORE A MEAL. FOR DIABETES., Disp: 180 tablet, Rfl: 1 .  glucose blood (ONETOUCH VERIO) test strip, USE AS INSTRUCTED TO TEST BLOOD SUGAR DAILY, Disp: 100 each, Rfl: 2 .  irbesartan-hydrochlorothiazide (AVALIDE) 150-12.5 MG tablet, TAKE 1 TABLET BY MOUTH EVERY DAY FOR BLOOD PRESSURE, Disp: 90 tablet, Rfl: 0 .  Lancets (ONETOUCH ULTRASOFT) lancets, Use as instructed to test blood sugar daily, Disp: 100 each, Rfl: 5 .  levothyroxine (SYNTHROID) 200 MCG tablet, Take 1 tablet by mouth every morning on an empty stomach with water only.  No food or  other medications for 30 minutes., Disp: 30 tablet, Rfl: 1 .  magic mouthwash SOLN, Take 5 mLs by mouth 3 (three) times daily as needed for mouth pain., Disp: 150 mL, Rfl: 2 .  metoprolol succinate (TOPROL-XL) 50 MG 24 hr tablet, TAKE 1 TABLET (50 MG TOTAL) BY MOUTH DAILY. TAKE WITH OR IMMEDIATELY FOLLOWING A MEAL., Disp: 90 tablet, Rfl: 0 .  nystatin (MYCOSTATIN) 100000 UNIT/ML suspension, , Disp: , Rfl:  .  omeprazole (PRILOSEC) 40 MG capsule, TAKE 1 CAPSULE BY MOUTH EVERY DAY, Disp: 90 capsule, Rfl: 1 .  potassium chloride SA (KLOR-CON) 20 MEQ tablet, Take 20 mEq by mouth daily., Disp: , Rfl:  .  rosuvastatin (CRESTOR) 10 MG tablet, TAKE 1 TABLET BY MOUTH EVERY DAY FOR CHOLESTEROL, Disp: 90 tablet, Rfl: 3 .  spironolactone (ALDACTONE) 25 MG tablet, Take 25 mg by mouth daily., Disp: , Rfl:  .  tamoxifen (NOLVADEX) 20 MG tablet, Take 1 tablet (20 mg total) by mouth daily., Disp: 90 tablet, Rfl: 1 No current facility-administered medications for this visit.  Facility-Administered Medications Ordered in Other Visits:  .  sodium chloride flush (NS) 0.9 % injection 10 mL, 10 mL, Intracatheter, PRN, Magrinat, Virgie Dad, MD, 10 mL at 09/19/18 1551   Objective:   Vitals:   02/28/20 1101  BP: (!) 158/101  Pulse: 83  Temp: 99 F (37.2 C)  SpO2: 99%    Physical Exam Vitals and nursing note reviewed.  Constitutional:      Appearance: Normal appearance.  HENT:     Head: Normocephalic and atraumatic.  Cardiovascular:     Rate and Rhythm: Normal rate.     Pulses: Normal pulses.  Pulmonary:     Effort: Pulmonary effort is normal.  Abdominal:     General: Abdomen is flat. There is no distension.     Tenderness: There is no abdominal tenderness.  Skin:    General: Skin is warm.  Neurological:     General: No focal deficit present.     Mental Status: She is alert and oriented to person, place, and time.  Psychiatric:        Mood and Affect: Mood normal.        Behavior: Behavior normal.         Thought Content: Thought content normal.     Assessment & Plan:  Malignant neoplasm of lower-inner quadrant of right breast of female, estrogen receptor positive ([place])  Postoperative breast asymmetry  We had a discussion regarding her risks and complications for surgical intervention of the right breast.  My recommendation is to leave the right breast alone.  She still has radiation changes noted.  Overall her shape is acceptable.  The left breast is larger.  This could be made to match with a mastopexy reduction.  Another option is implant placement in her bra with second to nature.  The patient would like to go with this option for now.  I think it is extremely reasonable and safe for her to do that and I support her decision. Pictures were obtained of the patient and placed in the chart with the patient's or guardian's permission.   [place], DO

## 2020-03-02 ENCOUNTER — Encounter (HOSPITAL_COMMUNITY): Payer: Self-pay | Admitting: Internal Medicine

## 2020-03-02 ENCOUNTER — Other Ambulatory Visit: Payer: Self-pay

## 2020-03-02 ENCOUNTER — Ambulatory Visit (HOSPITAL_COMMUNITY)
Admission: RE | Admit: 2020-03-02 | Discharge: 2020-03-02 | Disposition: A | Discharge status: Home / Self Care | Payer: 59 | Source: Ambulatory Visit | Attending: Internal Medicine | Admitting: Internal Medicine

## 2020-03-02 VITALS — BP 147/80 | HR 83 | Wt 239.0 lb

## 2020-03-02 DIAGNOSIS — J45909 Unspecified asthma, uncomplicated: Secondary | ICD-10-CM | POA: Diagnosis not present

## 2020-03-02 DIAGNOSIS — Z79899 Other long term (current) drug therapy: Secondary | ICD-10-CM | POA: Insufficient documentation

## 2020-03-02 DIAGNOSIS — E785 Hyperlipidemia, unspecified: Secondary | ICD-10-CM | POA: Diagnosis not present

## 2020-03-02 DIAGNOSIS — E1122 Type 2 diabetes mellitus with diabetic chronic kidney disease: Secondary | ICD-10-CM | POA: Insufficient documentation

## 2020-03-02 DIAGNOSIS — R0602 Shortness of breath: Secondary | ICD-10-CM | POA: Diagnosis not present

## 2020-03-02 DIAGNOSIS — Z801 Family history of malignant neoplasm of trachea, bronchus and lung: Secondary | ICD-10-CM | POA: Insufficient documentation

## 2020-03-02 DIAGNOSIS — I2510 Atherosclerotic heart disease of native coronary artery without angina pectoris: Secondary | ICD-10-CM | POA: Diagnosis not present

## 2020-03-02 DIAGNOSIS — E669 Obesity, unspecified: Secondary | ICD-10-CM | POA: Diagnosis not present

## 2020-03-02 DIAGNOSIS — Z888 Allergy status to other drugs, medicaments and biological substances status: Secondary | ICD-10-CM | POA: Insufficient documentation

## 2020-03-02 DIAGNOSIS — I10 Essential (primary) hypertension: Secondary | ICD-10-CM | POA: Diagnosis not present

## 2020-03-02 DIAGNOSIS — E890 Postprocedural hypothyroidism: Secondary | ICD-10-CM | POA: Insufficient documentation

## 2020-03-02 DIAGNOSIS — I509 Heart failure, unspecified: Secondary | ICD-10-CM | POA: Diagnosis present

## 2020-03-02 DIAGNOSIS — R0683 Snoring: Secondary | ICD-10-CM | POA: Diagnosis not present

## 2020-03-02 DIAGNOSIS — Z9221 Personal history of antineoplastic chemotherapy: Secondary | ICD-10-CM | POA: Insufficient documentation

## 2020-03-02 DIAGNOSIS — Z85850 Personal history of malignant neoplasm of thyroid: Secondary | ICD-10-CM | POA: Insufficient documentation

## 2020-03-02 DIAGNOSIS — Z8249 Family history of ischemic heart disease and other diseases of the circulatory system: Secondary | ICD-10-CM | POA: Diagnosis not present

## 2020-03-02 DIAGNOSIS — Z170 Estrogen receptor positive status [ER+]: Secondary | ICD-10-CM

## 2020-03-02 DIAGNOSIS — C50311 Malignant neoplasm of lower-inner quadrant of right female breast: Secondary | ICD-10-CM | POA: Diagnosis not present

## 2020-03-02 DIAGNOSIS — C50211 Malignant neoplasm of upper-inner quadrant of right female breast: Secondary | ICD-10-CM | POA: Insufficient documentation

## 2020-03-02 DIAGNOSIS — Z7984 Long term (current) use of oral hypoglycemic drugs: Secondary | ICD-10-CM | POA: Diagnosis not present

## 2020-03-02 DIAGNOSIS — R0600 Dyspnea, unspecified: Secondary | ICD-10-CM | POA: Diagnosis not present

## 2020-03-02 DIAGNOSIS — Z21 Asymptomatic human immunodeficiency virus [HIV] infection status: Secondary | ICD-10-CM | POA: Insufficient documentation

## 2020-03-02 DIAGNOSIS — Z6839 Body mass index (BMI) 39.0-39.9, adult: Secondary | ICD-10-CM | POA: Diagnosis not present

## 2020-03-02 DIAGNOSIS — Z87891 Personal history of nicotine dependence: Secondary | ICD-10-CM | POA: Insufficient documentation

## 2020-03-02 DIAGNOSIS — Z833 Family history of diabetes mellitus: Secondary | ICD-10-CM | POA: Diagnosis not present

## 2020-03-02 DIAGNOSIS — Z923 Personal history of irradiation: Secondary | ICD-10-CM | POA: Insufficient documentation

## 2020-03-02 DIAGNOSIS — I129 Hypertensive chronic kidney disease with stage 1 through stage 4 chronic kidney disease, or unspecified chronic kidney disease: Secondary | ICD-10-CM | POA: Insufficient documentation

## 2020-03-02 DIAGNOSIS — N183 Chronic kidney disease, stage 3 unspecified: Secondary | ICD-10-CM | POA: Insufficient documentation

## 2020-03-02 DIAGNOSIS — Z79890 Hormone replacement therapy (postmenopausal): Secondary | ICD-10-CM | POA: Diagnosis not present

## 2020-03-02 DIAGNOSIS — Z803 Family history of malignant neoplasm of breast: Secondary | ICD-10-CM | POA: Insufficient documentation

## 2020-03-02 DIAGNOSIS — Z7951 Long term (current) use of inhaled steroids: Secondary | ICD-10-CM | POA: Insufficient documentation

## 2020-03-02 NOTE — Addendum Note (Signed)
Encounter addended by: Shonna Chock, CMA on: 2/77/4128 5:07 PM  Actions taken: Clinical Note Signed

## 2020-03-02 NOTE — Progress Notes (Addendum)
Patient Name: [name]        DOB: 01-15-2066     Height: [place] Name:[facility]        Referring Provider:Dr. Glori Bickers Today's Date:03/02/2020  WP1 STOP BANG RISK ASSESSMENT S (snore) Have you been told that you snore?     YES   T (tired) Are you often tired, fatigued, or sleepy during the day?   YES  O (obstruction) Do you stop breathing, choke, or gasp during sleep? YES   P (pressure) Do you have or are you being treated for high blood pressure? YES   B (BMI) Is your body index greater than 35 kg/m? YES   A (age) Are you 37 years old or older? YES   N (neck) Do you have a neck circumference greater than 16 inches?   YES/NO   G (gender) Are you a female? NO   TOTAL STOP/BANG "YES" ANSWERS 6                                                                       For Office Use Only              Procedure Order Form    YES to 3+ Stop Bang questions OR two clinical symptoms - patient qualifies for WatchPAT (CPT 95800)     Submit: This Form + Patient Face Sheet + Clinical Note via CloudPAT or Fax: [phone]         Clinical Notes: Will consult Sleep Specialist and refer for management of therapy due to patient increased risk of Sleep Apnea. Ordering a sleep study due to the following two clinical symptoms: Excessive daytime sleepiness G47.10 / Gastroesophageal reflux K21.9 / Nocturia R35.1 / Morning Headaches G44.221 / Difficulty concentrating R41.840 / Memory problems or poor judgment G31.84 / Personality changes or irritability R45.4 / Loud snoring R06.83 / Depression F32.9 / Unrefreshed by sleep G47.8 / Impotence N52.9 / History of high blood pressure R03.0 / Insomnia G47.00    I understand that I am proceeding with a home sleep apnea test as ordered by my treating physician. I understand that untreated sleep apnea is a serious cardiovascular risk factor and it is my responsibility to perform the test and seek management for sleep  apnea. I will be contacted with the results and be managed for sleep apnea by a local sleep physician. I will be receiving equipment and further instructions from [facility]. I shall promptly ship back the equipment via the included mailing label. I understand my insurance will be billed for the test and as the patient I am responsible for any insurance related out-of-pocket costs incurred. I have been provided with written instructions and can call for additional video or telephonic instruction, with 24-hour availability of qualified personnel to answer any questions: Patient Help Desk [phone].  Patient Signature ______________________________________________________   Date______________________ Patient Telemedicine Verbal Consent

## 2020-03-02 NOTE — Progress Notes (Unsigned)
Patient Name: [name]        DOB: 06-Jun-2066     Height: [place] Name:[facility]        Referring Provider:Dr. Glori Bickers Today's Date:03/02/2020  Date:   STOP BANG RISK ASSESSMENT S (snore) Have you been told that you snore?     YES   T (tired) Are you often tired, fatigued, or sleepy during the day?   YES  O (obstruction) Do you stop breathing, choke, or gasp during sleep? YES   P (pressure) Do you have or are you being treated for high blood pressure? YES   B (BMI) Is your body index greater than 35 kg/m? YES   A (age) Are you 24 years old or older? YES/NO   N (neck) Do you have a neck circumference greater than 16 inches?   YES/NO   G (gender) Are you a female? YES/NO   TOTAL STOP/BANG "YES" ANSWERS                                                                        For Office Use Only              Procedure Order Form    YES to 3+ Stop Bang questions OR two clinical symptoms - patient qualifies for WatchPAT (CPT 95800)     Submit: This Form + Patient Face Sheet + Clinical Note via CloudPAT or Fax: [phone]         Clinical Notes: Will consult Sleep Specialist and refer for management of therapy due to patient increased risk of Sleep Apnea. Ordering a sleep study due to the following two clinical symptoms: Excessive daytime sleepiness G47.10 / Gastroesophageal reflux K21.9 / Nocturia R35.1 / Morning Headaches G44.221 / Difficulty concentrating R41.840 / Memory problems or poor judgment G31.84 / Personality changes or irritability R45.4 / Loud snoring R06.83 / Depression F32.9 / Unrefreshed by sleep G47.8 / Impotence N52.9 / History of high blood pressure R03.0 / Insomnia G47.00    I understand that I am proceeding with a home sleep apnea test as ordered by my treating physician. I understand that untreated sleep apnea is a serious cardiovascular risk factor and it is my responsibility to perform the test and seek management  for sleep apnea. I will be contacted with the results and be managed for sleep apnea by a local sleep physician. I will be receiving equipment and further instructions from [facility]. I shall promptly ship back the equipment via the included mailing label. I understand my insurance will be billed for the test and as the patient I am responsible for any insurance related out-of-pocket costs incurred. I have been provided with written instructions and can call for additional video or telephonic instruction, with 24-hour availability of qualified personnel to answer any questions: Patient Help Desk [phone].  Patient Signature ______________________________________________________   Date______________________ Patient Telemedicine Verbal Consent

## 2020-03-02 NOTE — Progress Notes (Signed)
ReDS Vest / Clip - 03/02/20 1500      ReDS Vest / Clip   Station Marker  B    Ruler Value  34    ReDS Value Range  (!) High volume overload    ReDS Actual Value  46    Anatomical Comments  sitting

## 2020-03-02 NOTE — Patient Instructions (Addendum)
No labs done today.  START Farxiga 10mg (1 tablet) by mouth daily. (start this the day after your heart cath)  No other medication changes were made. Please continue all other medications as prescribed.  Your physician recommends that you schedule a follow-up appointment in: 4 months  Your provider has recommended that you have a home sleep study.  [employer] is the company that does these test.  They will contact you by phone and must speak with you before they can ship the equipment.  Once they have spoken with you they will send the equipment right to your home with instructions on how to set it up.  Once you have completed the test simply box all the equipment back up and mail back to the company.  IF you have any questions or issues with the equipment please call the company directly at [phone].  If your test is positive for sleep apnea and you need a home CPAP machine you will be contacted by Dr Theodosia Blender office [facility]) to set this up.    At the [facility], you and your health needs are our priority. As part of our continuing mission to provide you with exceptional heart care, we have created designated Provider Care Teams. These Care Teams include your primary Cardiologist (physician) and Advanced Practice Providers (APPs- Physician Assistants and Nurse Practitioners) who all work together to provide you with the care you need, when you need it.   You may see any of the following providers on your designated Care Team at your next follow up: Marland Kitchen Dr Glori Bickers . Dr Loralie Champagne . Darrick Grinder, NP . Lyda Jester, PA . Audry Riles, PharmD   Please be sure to bring in all your medications bottles to every appointment.

## 2020-03-02 NOTE — Addendum Note (Signed)
Encounter addended by: Shonna Chock, CMA on: 9/87/2158 5:05 PM  Actions taken: Clinical Note Signed

## 2020-03-02 NOTE — Progress Notes (Signed)
Cardio-oncology Tate Note    Date:  03/02/2020   ID:  [name], [name] [birth year], MRN [id number]  Location: Home  Provider location: [name] Type of Visit: Established patient  PCP:  [name], NP  Cardiologist:  No primary care provider on file. Primary HF: [name]  Chief Complaint: Heart Failure follow-up   History of Present Illness:  [name] is a  54 y.o. female with h/o DM, Graves disease, thyroid CA, HIV, HTN and right breast cancer referred by Dr. Doris Cheadle for enrollment into the Cardio-Oncology program for EF monitoring during chemotherapy  Right breast CA -  upper inner quadrant biopsy 04/26/2018 for a clinical T2 N0, stage Ib invasive ductal carcinoma, grade 2, with an MIB-1 of 70%.   (1) neoadjuvant chemotherapy with carboplatin, docetaxel, trastuzumab and Pertuzumab given every 21 days x 6, starting 05/31/2018 (a) Pertuzumab discontinued starting with cycle 2 because of diarrhea problems  (3) trastuzumab to continue to total 6 months (a) echo on 05/16/2018 shows an EF of 60-65%. (b) echo on 08/27/18 shows an EF 55-60%             (c) echo on 12/03/18 EF 60-65% GLS -18.8  (d) echo on 03/06/19 EF 55% (read as 40-45%) GLS 15.7% - underestimated  (4) s/p lumpectomy 12/19  (5) adjuvant radiation completed 5/20  (6) antiestrogens to start at the completion of local treatment  We saw [name] in September 2020 for increased SOB. Felt it was volume overload. ReDs was 39%. BP up. Started on spiro and Farxiga but creatinine bumped. [name] stopped and the [place] stopped. [name] finished Herceptin on 11/04/19. Feels ok. Gets SOB easily when [name] moves around. No edema. No chest pressure or pain.   ReDS 46%   Echo 02/20/20: EF 55-60% Grade I DD. RV ok Personally reviewed   Studies:   CXR 05/24/19: No acute disease   Echo 06/20/19 EF 55-60% Grade 1 DD GLS not measured. Personally  reviewed  CT scan 12/19 with LAD calcification     Echo on 03/06/19 EF 55-60% (read as 40-45%) GLS 15.7% - underestimated   Past Medical History:  Diagnosis Date  . Allergy   . Anemia    Normocytic  . Anxiety   . Asthma   . Blood dyscrasia   . Bronchitis 2005  . 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 Diabetes mellitus without complication ([place])   . Family history of breast cancer   . Family history of lung cancer   . Family history of prostate cancer   . Gastroenteritis 07/10/07  . GERD 07/24/2006  . GRAVE'S DISEASE 01/01/2008  . History of hidradenitis suppurativa   . History of kidney stones   . History of thrush   . HIV DISEASE 07/24/2006   dx March 05  . Hyperlipidemia   . HYPERTENSION 07/24/2006  . Hyperthyroidism 08/2006   Grave's Disease -diffuse radiotracer uptake 08/25/06 Thyroid scan-Cold nodule to R lower lobe of thyrorid  . Menometrorrhagia    hx of  . Nephrolithiasis   . 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 Personal history of chemotherapy    2020  . Personal history of radiation therapy    2020  . Pneumonia 2005  . 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 Sarcoidosis 02/08/2007   dx as a teenager in [place] from abnl CXR. Completed 2 yrs Prednisone after lung bx confirmation. No symptoms since then.  . Suppurative hidradenitis   .  Thyroid cancer ([place])   . THYROID NODULE, RIGHT 02/08/2007   Past Surgical History:  Procedure Laterality Date  . BREAST EXCISIONAL BIOPSY Right 04/26/2018   right axilla negative  . BREAST EXCISIONAL BIOPSY Left 04/26/2018   left axilla negative  . BREAST LUMPECTOMY Right 10/03/2018   malignant  . BREAST LUMPECTOMY WITH RADIOACTIVE SEED AND SENTINEL LYMPH NODE BIOPSY Right 10/03/2018   Procedure: RIGHT BREAST LUMPECTOMY WITH RADIOACTIVE SEED AND SENTINEL LYMPH NODE MAPPING;  Surgeon: Erroll Luna, MD;  Location: [place];  Service: General;   Laterality: Right;  . BREAST SURGERY  1997   Breast Reduction   . CYSTOSCOPY W/ URETERAL STENT REMOVAL  11/09/2012   Procedure: CYSTOSCOPY WITH STENT REMOVAL;  Surgeon: Alexis Frock, MD;  Location: [facility];  Service: Urology;  Laterality: Right;  . CYSTOSCOPY WITH RETROGRADE PYELOGRAM, URETEROSCOPY AND STENT PLACEMENT  11/09/2012   Procedure: CYSTOSCOPY WITH RETROGRADE PYELOGRAM, URETEROSCOPY AND STENT PLACEMENT;  Surgeon: Alexis Frock, MD;  Location: [facility];  Service: Urology;  Laterality: Left;  LEFT URETEROSCOPY, STONE MANIPULATION, left STENT exchange   . CYSTOSCOPY WITH STENT PLACEMENT  10/02/2012   Procedure: CYSTOSCOPY WITH STENT PLACEMENT;  Surgeon: Alexis Frock, MD;  Location[facility];  Service: Urology;  Laterality: Left;  . DILATION AND CURETTAGE OF UTERUS  Feb 2004   s/p for 1st trimester nonviable pregnancy  . EYE SURGERY     sty under eyelid  . INCISE AND DRAIN ABCESS  Nov 03   s/p I &D for righ inframmary fold hidradenitis  . INCISION AND DRAINAGE PERITONSILLAR ABSCESS  Mar 03  . IR CV LINE INJECTION  06/07/2018  . IR IMAGING GUIDED PORT INSERTION  06/20/2018  . IR REMOVAL TUN ACCESS W/ PORT W/O FL MOD SED  06/20/2018  . IRRIGATION AND DEBRIDEMENT ABSCESS  01/31/2012   Procedure: IRRIGATION AND DEBRIDEMENT ABSCESS;  Surgeon: Shann Medal, MD;  Location[facility];  Service: General;  Laterality: Right;  right breast and axilla   . NEPHROLITHOTOMY  10/02/2012   Procedure: NEPHROLITHOTOMY PERCUTANEOUS;  Surgeon: Alexis Frock, MD;  [facility];  Service: Urology;  Laterality: Right;  First Stage Percutaneous Nephrolithotomy with Surgeon Access, Left Ureteral Stent    . NEPHROLITHOTOMY  10/04/2012   Procedure: NEPHROLITHOTOMY PERCUTANEOUS SECOND LOOK;  Surgeon: Alexis Frock, MD;  [facility];  Service: Urology;  Laterality: Right;     . NEPHROLITHOTOMY  10/08/2012   Procedure: NEPHROLITHOTOMY PERCUTANEOUS;  Surgeon: Alexis Frock, MD;  [facility];  Service:  Urology;  Laterality: Right;  THIRD STAGE, nephrostomy tube exchange x 2  . NEPHROLITHOTOMY  10/11/2012   Procedure: NEPHROLITHOTOMY PERCUTANEOUS SECOND LOOK;  Surgeon: Alexis Frock, MD;  [facility];  Service: Urology;  Laterality: Right;  RIGHT 4 STAGE PERCUTANOUS NEPHROLITHOTOMY, right URETEROSCOPY WITH HOLMIUM LASER   . PORTACATH PLACEMENT Left 05/17/2018   Procedure: INSERTION PORT-A-CATH;  Surgeon: Coralie Keens, MD;  Location: [place];  Service: General;  Laterality: Left;  . RADICAL NECK DISSECTION  04/12/2017   limited/notes 04/12/2017  . RADICAL NECK DISSECTION N/A 04/12/2017   Procedure: RADICAL NECK DISSECTION;  Surgeon: Melida Quitter, MD;  Location: [facility];  Service: ENT;  Laterality: N/A;  limited neck dissection 2 hours total  . REDUCTION MAMMAPLASTY Bilateral 1998  . [place]  1994  . THYROIDECTOMY  04/12/2017   completion/notes 04/12/2017  . THYROIDECTOMY N/A 04/12/2017   Procedure: THYROIDECTOMY;  Surgeon: Melida Quitter, MD;  Location: [place];  Service: ENT;  Laterality: N/A;  Completion Thyroidectomy  . TOTAL THYROIDECTOMY  2010     Current Outpatient Medications  Medication Sig Dispense Refill  . Blood Glucose Monitoring Suppl (ONETOUCH VERIO) w/Device KIT USE AS INSTRUCTED TO TEST BLOOD SUGAR UP TO 4 TIMES DAILY E11.9 1 kit 0  . dolutegravir ([name]) 50 MG tablet Take 1 tablet (50 mg total) by mouth daily. 30 tablet 2  . doxycycline (VIBRA-TABS) 100 MG tablet Take 1 tablet (100 mg total) by mouth 2 (two) times daily. 60 tablet 11  . Dulaglutide (TRULICITY) 7.56 EP/3.2RJ SOPN Inject 0.75 mg into the skin once a week. For diabetes. 2 mL 3  . emtricitabine-tenofovir AF (DESCOVY) 200-25 MG tablet Take 1 tablet by mouth daily. 30 tablet 2  . FLOVENT HFA 110 MCG/ACT inhaler TAKE 1 PUFF BY MOUTH TWICE A DAY 36 Inhaler 1  . fluconazole (DIFLUCAN) 100 MG tablet Take 100 mg by mouth daily as needed.     . gabapentin (NEURONTIN) 300 MG capsule Take 1 capsule  (300 mg total) by mouth 3 (three) times daily. 90 capsule 3  . glipiZIDE (GLUCOTROL) 5 MG tablet TAKE 1 TABLET (5 MG TOTAL) BY MOUTH 2 (TWO) TIMES DAILY BEFORE A MEAL. FOR DIABETES. 180 tablet 1  . glucose blood (ONETOUCH VERIO) test strip USE AS INSTRUCTED TO TEST BLOOD SUGAR DAILY 100 each 2  . irbesartan-hydrochlorothiazide (AVALIDE) 150-12.5 MG tablet TAKE 1 TABLET BY MOUTH EVERY DAY FOR BLOOD PRESSURE 90 tablet 0  . Lancets (ONETOUCH ULTRASOFT) lancets Use as instructed to test blood sugar daily 100 each 5  . levothyroxine (SYNTHROID) 200 MCG tablet Take 1 tablet by mouth every morning on an empty stomach with water only.  No food or other medications for 30 minutes. 30 tablet 1  . magic mouthwash SOLN Take 5 mLs by mouth 3 (three) times daily as needed for mouth pain. 150 mL 2  . metoprolol succinate (TOPROL-XL) 50 MG 24 hr tablet TAKE 1 TABLET (50 MG TOTAL) BY MOUTH DAILY. TAKE WITH OR IMMEDIATELY FOLLOWING A MEAL. 90 tablet 0  . nystatin (MYCOSTATIN) 100000 UNIT/ML suspension     . omeprazole (PRILOSEC) 40 MG capsule TAKE 1 CAPSULE BY MOUTH EVERY DAY 90 capsule 1  . potassium chloride SA (KLOR-CON) 20 MEQ tablet Take 20 mEq by mouth daily.    . predniSONE (DELTASONE) 20 MG tablet Take 20 mg by mouth 2 (two) times daily as needed (gout).     . rosuvastatin (CRESTOR) 10 MG tablet TAKE 1 TABLET BY MOUTH EVERY DAY FOR CHOLESTEROL 90 tablet 3  . spironolactone (ALDACTONE) 25 MG tablet Take 25 mg by mouth daily.    . tamoxifen (NOLVADEX) 20 MG tablet Take 1 tablet (20 mg total) by mouth daily. 90 tablet 1   No current facility-administered medications for this encounter.   Facility-Administered Medications Ordered in Other Encounters  Medication Dose Route Frequency Provider Last Rate Last Admin  . sodium chloride flush (NS) 0.9 % injection 10 mL  10 mL Intracatheter PRN Magrinat, Virgie Dad, MD   10 mL at 09/19/18 1551    Allergies:   Genvoya [elviteg-cobic-emtricit-tenofaf], Lisinopril, and  Valsartan   Social History:  The patient  reports that [name] has quit smoking. Her smoking use included cigarettes. [name] started smoking about 2 years ago. [name] has a 7.50 pack-year smoking history. [name] has never used smokeless tobacco. [name] reports current alcohol use. [name] reports that [name] does not use drugs.   Family History:  The patient's family history includes Breast cancer  in her cousin, cousin, cousin, maternal aunt, paternal aunt, and paternal aunt; Breast cancer (age of onset: 59) in her cousin; Breast cancer (age of onset: 42) in her paternal aunt; Breast cancer (age of onset: 13) in her maternal aunt; Cancer in her maternal uncle and mother; Diabetes in an other family member; Heart disease in her mother and another family member; Hypertension in her brother, father, mother, sister, sister, and another family member; Kidney disease in an other family member; Lung cancer in her paternal uncle; Lung cancer (age of onset: 14) in her father; Prostate cancer in her paternal uncle and paternal uncle; Stroke in an other family member.   ROS:  Please see the history of present illness.   All other systems are personally reviewed and negative.   Vitals:   03/02/20 1424  BP: (!) 147/80  Pulse: 83  SpO2: 100%    Exam:  General:  Well appearing. No resp difficulty HEENT: normal Neck: supple. no JVD. Carotids 2+ bilat; no bruits. No lymphadenopathy or thryomegaly appreciated. Cor: PMI nondisplaced. Regular rate & rhythm. No rubs, gallops or murmurs. Lungs: clear Abdomen: obese soft, nontender, nondistended. No hepatosplenomegaly. No bruits or masses. Good bowel sounds. Extremities: no cyanosis, clubbing, rash, edema Neuro: alert & orientedx3, cranial nerves grossly intact. moves all 4 extremities w/o difficulty. Affect pleasant   Recent Labs: 07/11/2019: B Natriuretic Peptide 22.5 01/27/2020: TSH 17.400 02/12/2020: ALT 26; BUN 22; Creatinine, Ser 1.72; Hemoglobin 12.1; Platelets 139; Potassium  3.5; Sodium 140  Personally reviewed   Wt Readings from Last 3 Encounters:  03/02/20 108.4 kg (239 lb)  02/28/20 107.3 kg (236 lb 9.6 oz)  02/24/20 108.4 kg (239 lb)      ASSESSMENT AND PLAN:  1. Dyspnea - echo 9/20  EF 55-60% Grade I DD  - echo 5/21 EF 55-60% Grade I DD - Initial ReDS 43% suggesting elevated lung water. F/u ReDS was 39%. Today [name] is 46% - [name] stopped to A/CKD - Volume status ok on exam - Given severity of dyspnea and CRFs will proceed with R/L cath - Needs weight loss - Sleep study ordered but unable to get it because lost insurance. Now has insurance again and will re-order  2. R Breast Cancer - EF and Doppler parameters stabl e and echo  - Has finished Herceptin  - Echo 5/21 Echo 55-60%   3. HTN - Blood pressure remains elevated - Will refer to HTN Tate - Check sleep study  4. Acute on CKD3 - baseline creatinine 1.5-1.9.  - most recent creatinine 1.7 - Follows with [facility] (Dr. Posey Pronto) - Restart [place]  5. Snoring - likely has OSA - will re-order home sleep study as above  6. DM2 - last HGBa1c 7.1 - Continue Farxiga  7. Obesity - encouraged weight loss    8. Coronary calcifications on CT - as above proceed with R/L cath to further evaluate severe dyspnea  Signed, Glori Bickers, MD  03/02/2020 3:20 PM  [facility] [place] [address] [facility] [place] [address] [phone] (office) [phone] (fax)

## 2020-03-04 ENCOUNTER — Encounter (HOSPITAL_COMMUNITY): Payer: Self-pay | Admitting: Internal Medicine

## 2020-03-04 NOTE — Addendum Note (Signed)
Encounter addended by: Shonna Chock, CMA on: 7/94/3276 4:06 PM  Actions taken: Visit diagnoses modified, Order list changed, Diagnosis association updated, Clinical Note Signed, Follow-up modified

## 2020-03-05 ENCOUNTER — Telehealth (HOSPITAL_COMMUNITY): Payer: Self-pay

## 2020-03-05 ENCOUNTER — Encounter (HOSPITAL_COMMUNITY): Payer: Self-pay

## 2020-03-05 MED ORDER — FARXIGA 10 MG PO TABS
10.0000 mg | ORAL_TABLET | Freq: Every day | ORAL | 5 refills | Status: DC
Start: 2020-03-05 — End: 2020-07-21

## 2020-03-05 NOTE — Telephone Encounter (Signed)
Dear [name],  You are scheduled for Cardiac Catheterization on Thursday May 27th, 2021 with Dr. Haroldine Laws.  Please arrive at the MAIN ENTRANCE (ENTRANCE A) of [facility] at 10 a.m. on the day of your procedure.  1. DIET: Nothing to eat or drink after midnight except your medications with a sip of water.  2. YOU MUST HAVE A PRE PROCEDURE COVID TEST DONE Tuesday MAY 25TH BETWEEN 9AM-3PM AT [facility]YOU MUST SELF QUARANTINE UNTIL YOUR PROCEDURE    3. DO NOT TAKE Spironolactone, Glipizide, Trulicity the morning of your procedure  4.YOU MAY TAKE ALL of your remaining medications with a small amount of water.  5. Plan for one night stay - bring personal belongings (i.e. toothpaste, toothbrush, etc.)  6. Bring a current list of your medications and current insurance cards.  7. Must have a responsible person to drive you home.  8. Someone must be with you for the first 24 hours after you arrive home.  9. Please wear clothes that are easy to get on and off and wear slip-on shoes.  * Special note: Every effort is made to have your procedure done on time. Occasionally there are emergencies that present themselves at the hospital that may cause delays. Please be patient if a delay does occur.  If you have any questions after you get home, please call the office at [phone] OPTION 2

## 2020-03-09 ENCOUNTER — Telehealth (HOSPITAL_COMMUNITY): Payer: Self-pay | Admitting: *Deleted

## 2020-03-09 NOTE — Telephone Encounter (Signed)
Pt left VM requesting a return call about questions she has regarding her procedure this week. I called pt back no answer/left vm requesting return call.

## 2020-03-10 ENCOUNTER — Other Ambulatory Visit (HOSPITAL_COMMUNITY)
Admission: RE | Admit: 2020-03-10 | Discharge: 2020-03-10 | Disposition: A | Discharge status: Home / Self Care | Payer: 59 | Source: Ambulatory Visit | Attending: Internal Medicine | Admitting: Internal Medicine

## 2020-03-10 DIAGNOSIS — Z01812 Encounter for preprocedural laboratory examination: Secondary | ICD-10-CM | POA: Diagnosis present

## 2020-03-10 DIAGNOSIS — Z20822 Contact with and (suspected) exposure to covid-19: Secondary | ICD-10-CM | POA: Diagnosis not present

## 2020-03-10 LAB — SARS CORONAVIRUS 2 (TAT 6-24 HRS): SARS Coronavirus 2: NEGATIVE

## 2020-03-11 ENCOUNTER — Telehealth (HOSPITAL_COMMUNITY): Payer: Self-pay

## 2020-03-11 NOTE — Telephone Encounter (Signed)
Patient calling to inquire about how long a heart cath takes.  I advised her that it takes about 67min-1hr. Patient was apprecative for info.

## 2020-03-12 ENCOUNTER — Encounter (HOSPITAL_COMMUNITY)
Admission: RE | Disposition: A | Discharge status: Home / Self Care | Payer: Self-pay | Source: Home / Self Care | Attending: Internal Medicine

## 2020-03-12 ENCOUNTER — Ambulatory Visit (HOSPITAL_COMMUNITY)
Admission: RE | Admit: 2020-03-12 | Discharge: 2020-03-12 | Disposition: A | Discharge status: Home / Self Care | Payer: 59 | Attending: Internal Medicine | Admitting: Internal Medicine

## 2020-03-12 ENCOUNTER — Other Ambulatory Visit: Payer: Self-pay

## 2020-03-12 DIAGNOSIS — K219 Gastro-esophageal reflux disease without esophagitis: Secondary | ICD-10-CM | POA: Diagnosis not present

## 2020-03-12 DIAGNOSIS — Z21 Asymptomatic human immunodeficiency virus [HIV] infection status: Secondary | ICD-10-CM | POA: Insufficient documentation

## 2020-03-12 DIAGNOSIS — Z9221 Personal history of antineoplastic chemotherapy: Secondary | ICD-10-CM | POA: Diagnosis not present

## 2020-03-12 DIAGNOSIS — E785 Hyperlipidemia, unspecified: Secondary | ICD-10-CM | POA: Insufficient documentation

## 2020-03-12 DIAGNOSIS — R0600 Dyspnea, unspecified: Secondary | ICD-10-CM

## 2020-03-12 DIAGNOSIS — Z6838 Body mass index (BMI) 38.0-38.9, adult: Secondary | ICD-10-CM | POA: Diagnosis not present

## 2020-03-12 DIAGNOSIS — Z79890 Hormone replacement therapy (postmenopausal): Secondary | ICD-10-CM | POA: Insufficient documentation

## 2020-03-12 DIAGNOSIS — R0609 Other forms of dyspnea: Secondary | ICD-10-CM | POA: Insufficient documentation

## 2020-03-12 DIAGNOSIS — E1122 Type 2 diabetes mellitus with diabetic chronic kidney disease: Secondary | ICD-10-CM | POA: Insufficient documentation

## 2020-03-12 DIAGNOSIS — Z853 Personal history of malignant neoplasm of breast: Secondary | ICD-10-CM | POA: Diagnosis not present

## 2020-03-12 DIAGNOSIS — D649 Anemia, unspecified: Secondary | ICD-10-CM | POA: Diagnosis not present

## 2020-03-12 DIAGNOSIS — Z923 Personal history of irradiation: Secondary | ICD-10-CM | POA: Insufficient documentation

## 2020-03-12 DIAGNOSIS — E669 Obesity, unspecified: Secondary | ICD-10-CM | POA: Insufficient documentation

## 2020-03-12 DIAGNOSIS — R0683 Snoring: Secondary | ICD-10-CM | POA: Insufficient documentation

## 2020-03-12 DIAGNOSIS — I129 Hypertensive chronic kidney disease with stage 1 through stage 4 chronic kidney disease, or unspecified chronic kidney disease: Secondary | ICD-10-CM | POA: Diagnosis not present

## 2020-03-12 DIAGNOSIS — Z794 Long term (current) use of insulin: Secondary | ICD-10-CM | POA: Diagnosis not present

## 2020-03-12 DIAGNOSIS — E0590 Thyrotoxicosis, unspecified without thyrotoxic crisis or storm: Secondary | ICD-10-CM | POA: Diagnosis not present

## 2020-03-12 DIAGNOSIS — Z87891 Personal history of nicotine dependence: Secondary | ICD-10-CM | POA: Diagnosis not present

## 2020-03-12 DIAGNOSIS — F419 Anxiety disorder, unspecified: Secondary | ICD-10-CM | POA: Diagnosis not present

## 2020-03-12 DIAGNOSIS — I272 Pulmonary hypertension, unspecified: Secondary | ICD-10-CM | POA: Insufficient documentation

## 2020-03-12 DIAGNOSIS — Z85850 Personal history of malignant neoplasm of thyroid: Secondary | ICD-10-CM | POA: Insufficient documentation

## 2020-03-12 DIAGNOSIS — N183 Chronic kidney disease, stage 3 unspecified: Secondary | ICD-10-CM | POA: Diagnosis not present

## 2020-03-12 DIAGNOSIS — Z79899 Other long term (current) drug therapy: Secondary | ICD-10-CM | POA: Diagnosis not present

## 2020-03-12 HISTORY — PX: RIGHT/LEFT HEART CATH AND CORONARY ANGIOGRAPHY: CATH118266

## 2020-03-12 LAB — CBC
HCT: 40.3 % (ref 36.0–46.0)
Hemoglobin: 13.2 g/dL (ref 12.0–15.0)
MCH: 30.9 pg (ref 26.0–34.0)
MCHC: 32.8 g/dL (ref 30.0–36.0)
MCV: 94.4 fL (ref 80.0–100.0)
Platelets: 186 10*3/uL (ref 150–400)
RBC: 4.27 MIL/uL (ref 3.87–5.11)
RDW: 18 % — ABNORMAL HIGH (ref 11.5–15.5)
WBC: 7.3 10*3/uL (ref 4.0–10.5)
nRBC: 0 % (ref 0.0–0.2)

## 2020-03-12 LAB — POCT I-STAT EG7
Acid-Base Excess: 4 mmol/L — ABNORMAL HIGH (ref 0.0–2.0)
Acid-Base Excess: 3 mmol/L — ABNORMAL HIGH (ref 0.0–2.0)
Bicarbonate: 30.4 mmol/L — ABNORMAL HIGH (ref 20.0–28.0)
Bicarbonate: 29.4 mmol/L — ABNORMAL HIGH (ref 20.0–28.0)
Calcium, Ion: 1.2 mmol/L (ref 1.15–1.40)
Calcium, Ion: 1.19 mmol/L (ref 1.15–1.40)
HCT: 37 % (ref 36.0–46.0)
HCT: 35 % — ABNORMAL LOW (ref 36.0–46.0)
Hemoglobin: 12.6 g/dL (ref 12.0–15.0)
Hemoglobin: 11.9 g/dL — ABNORMAL LOW (ref 12.0–15.0)
O2 Saturation: 64 %
O2 Saturation: 65 %
Potassium: 3.1 mmol/L — ABNORMAL LOW (ref 3.5–5.1)
Potassium: 2.9 mmol/L — ABNORMAL LOW (ref 3.5–5.1)
Sodium: 142 mmol/L (ref 135–145)
Sodium: 144 mmol/L (ref 135–145)
TCO2: 32 mmol/L (ref 22–32)
TCO2: 31 mmol/L (ref 22–32)
pCO2, Ven: 52.3 mmHg (ref 44.0–60.0)
pCO2, Ven: 50.6 mmHg (ref 44.0–60.0)
pH, Ven: 7.372 (ref 7.250–7.430)
pH, Ven: 7.372 (ref 7.250–7.430)
pO2, Ven: 35 mmHg (ref 32.0–45.0)
pO2, Ven: 35 mmHg (ref 32.0–45.0)

## 2020-03-12 LAB — POCT I-STAT 7, (LYTES, BLD GAS, ICA,H+H)
Acid-Base Excess: 2 mmol/L (ref 0.0–2.0)
Bicarbonate: 27.3 mmol/L (ref 20.0–28.0)
Calcium, Ion: 1.05 mmol/L — ABNORMAL LOW (ref 1.15–1.40)
HCT: 34 % — ABNORMAL LOW (ref 36.0–46.0)
Hemoglobin: 11.6 g/dL — ABNORMAL LOW (ref 12.0–15.0)
O2 Saturation: 96 %
Potassium: 2.9 mmol/L — ABNORMAL LOW (ref 3.5–5.1)
Sodium: 144 mmol/L (ref 135–145)
TCO2: 29 mmol/L (ref 22–32)
pCO2 arterial: 44.2 mmHg (ref 32.0–48.0)
pH, Arterial: 7.398 (ref 7.350–7.450)
pO2, Arterial: 82 mmHg — ABNORMAL LOW (ref 83.0–108.0)

## 2020-03-12 LAB — BASIC METABOLIC PANEL
Anion gap: 11 (ref 5–15)
BUN: 28 mg/dL — ABNORMAL HIGH (ref 6–20)
CO2: 26 mmol/L (ref 22–32)
Calcium: 8.4 mg/dL — ABNORMAL LOW (ref 8.9–10.3)
Chloride: 99 mmol/L (ref 98–111)
Creatinine, Ser: 1.73 mg/dL — ABNORMAL HIGH (ref 0.44–1.00)
GFR calc Af Amer: 39 mL/min — ABNORMAL LOW (ref >60)
GFR calc non Af Amer: 34 mL/min — ABNORMAL LOW (ref >60)
Glucose, Bld: 203 mg/dL — ABNORMAL HIGH (ref 70–99)
Potassium: 3.4 mmol/L — ABNORMAL LOW (ref 3.5–5.1)
Sodium: 136 mmol/L (ref 135–145)

## 2020-03-12 LAB — GLUCOSE, CAPILLARY: Glucose-Capillary: 204 mg/dL — ABNORMAL HIGH (ref 70–99)

## 2020-03-12 SURGERY — RIGHT/LEFT HEART CATH AND CORONARY ANGIOGRAPHY
Anesthesia: LOCAL

## 2020-03-12 MED ORDER — HEPARIN (PORCINE) IN NACL 1000-0.9 UT/500ML-% IV SOLN
INTRAVENOUS | Status: AC
  Filled 2020-03-12: qty 1000

## 2020-03-12 MED ORDER — HEPARIN SODIUM (PORCINE) 1000 UNIT/ML IJ SOLN
INTRAMUSCULAR | Status: AC
  Filled 2020-03-12: qty 1

## 2020-03-12 MED ORDER — MIDAZOLAM HCL 2 MG/2ML IJ SOLN
INTRAMUSCULAR | Status: AC
  Filled 2020-03-12: qty 2

## 2020-03-12 MED ORDER — ASPIRIN 81 MG PO CHEW
81.0000 mg | CHEWABLE_TABLET | ORAL | Status: AC
  Administered 2020-03-12: 81 mg via ORAL
  Filled 2020-03-12: qty 1

## 2020-03-12 MED ORDER — HEPARIN SODIUM (PORCINE) 1000 UNIT/ML IJ SOLN
INTRAMUSCULAR | Status: DC | PRN
  Administered 2020-03-12: 5000 [IU] via INTRAVENOUS

## 2020-03-12 MED ORDER — VERAPAMIL HCL 2.5 MG/ML IV SOLN
INTRAVENOUS | Status: AC
  Filled 2020-03-12: qty 2

## 2020-03-12 MED ORDER — SODIUM CHLORIDE 0.9 % IV SOLN: 250.0000 mL | INTRAVENOUS | Status: DC | PRN

## 2020-03-12 MED ORDER — IOHEXOL 350 MG/ML SOLN
INTRAVENOUS | Status: DC | PRN
  Administered 2020-03-12: 45 mL

## 2020-03-12 MED ORDER — FENTANYL CITRATE (PF) 100 MCG/2ML IJ SOLN
INTRAMUSCULAR | Status: AC
  Filled 2020-03-12: qty 2

## 2020-03-12 MED ORDER — HEPARIN (PORCINE) IN NACL 1000-0.9 UT/500ML-% IV SOLN
INTRAVENOUS | Status: DC | PRN
  Administered 2020-03-12 (×2): 500 mL

## 2020-03-12 MED ORDER — SODIUM CHLORIDE 0.9% FLUSH: 3.0000 mL | Freq: Two times a day (BID) | INTRAVENOUS | Status: DC

## 2020-03-12 MED ORDER — SODIUM CHLORIDE 0.9 % IV SOLN: INTRAVENOUS | Status: DC

## 2020-03-12 MED ORDER — VERAPAMIL HCL 2.5 MG/ML IV SOLN
INTRAVENOUS | Status: DC | PRN
  Administered 2020-03-12: 10 mL via INTRA_ARTERIAL

## 2020-03-12 MED ORDER — LIDOCAINE HCL (PF) 1 % IJ SOLN
INTRAMUSCULAR | Status: AC
  Filled 2020-03-12: qty 30

## 2020-03-12 MED ORDER — FENTANYL CITRATE (PF) 100 MCG/2ML IJ SOLN
INTRAMUSCULAR | Status: DC | PRN
  Administered 2020-03-12: 25 ug via INTRAVENOUS

## 2020-03-12 MED ORDER — SODIUM CHLORIDE 0.9% FLUSH: 3.0000 mL | INTRAVENOUS | Status: DC | PRN

## 2020-03-12 MED ORDER — LIDOCAINE HCL (PF) 1 % IJ SOLN
INTRAMUSCULAR | Status: DC | PRN
  Administered 2020-03-12 (×2): 2 mL

## 2020-03-12 MED ORDER — MIDAZOLAM HCL 2 MG/2ML IJ SOLN
INTRAMUSCULAR | Status: DC | PRN
  Administered 2020-03-12: 2 mg via INTRAVENOUS

## 2020-03-12 SURGICAL SUPPLY — 11 items
CATH 5FR JL3.5 JR4 ANG PIG MP (CATHETERS) ×2 IMPLANT
CATH BALLN WEDGE 5F 110CM (CATHETERS) ×2 IMPLANT
DEVICE RAD COMP TR BAND LRG (VASCULAR PRODUCTS) ×2 IMPLANT
GLIDESHEATH SLEND SS 6F .021 (SHEATH) ×2 IMPLANT
GUIDEWIRE .025 260CM (WIRE) ×2 IMPLANT
GUIDEWIRE INQWIRE 1.5J.035X260 (WIRE) ×1 IMPLANT
INQWIRE 1.5J .035X260CM (WIRE) ×2
KIT HEART LEFT (KITS) ×2 IMPLANT
PACK CARDIAC CATHETERIZATION (CUSTOM PROCEDURE TRAY) ×2 IMPLANT
SHEATH GLIDE SLENDER 4/5FR (SHEATH) ×2 IMPLANT
TRANSDUCER W/STOPCOCK (MISCELLANEOUS) ×2 IMPLANT

## 2020-03-12 NOTE — Discharge Instructions (Signed)
Drink plenty of fluid for 48 hours and keep wrist elevated at heart level for 24 hours  Radial Site Care   This sheet gives you information about how to care for yourself after your procedure. Your health care provider may also give you more specific instructions. If you have problems or questions, contact your health care provider. What can I expect after the procedure? After the procedure, it is common to have:  Bruising and tenderness at the catheter insertion area. Follow these instructions at home: Medicines  Take over-the-counter and prescription medicines only as told by your health care provider. Insertion site care 1. Follow instructions from your health care provider about how to take care of your insertion site. Make sure you: ? Wash your hands with soap and water before you change your bandage (dressing). If soap and water are not available, use hand sanitizer. ? remove your dressing as told by your health care provider. In 24 hours 2. Check your insertion site every day for signs of infection. Check for: ? Redness, swelling, or pain. ? Fluid or blood. ? Pus or a bad smell. ? Warmth. 3. Do not take baths, swim, or use a hot tub until your health care provider approves. 4. You may shower 24-48 hours after the procedure, or as directed by your health care provider. ? Remove the dressing and gently wash the site with plain soap and water. ? Pat the area dry with a clean towel. ? Do not rub the site. That could cause bleeding. 5. Do not apply powder or lotion to the site. Activity   1. For 24 hours after the procedure, or as directed by your health care provider: ? Do not flex or bend the affected arm. ? Do not push or pull heavy objects with the affected arm. ? Do not drive yourself home from the hospital or clinic. You may drive 24 hours after the procedure unless your health care provider tells you not to. ? Do not operate machinery or power tools. 2. Do not lift  anything that is heavier than 10 lb (4.5 kg), or the limit that you are told, until your health care provider says that it is safe. For 4 days 3. Ask your health care provider when it is okay to: ? Return to work or school. ? Resume usual physical activities or sports. ? Resume sexual activity. General instructions  If the catheter site starts to bleed, raise your arm and put firm pressure on the site. If the bleeding does not stop, get help right away. This is a medical emergency.  If you went home on the same day as your procedure, a responsible adult should be with you for the first 24 hours after you arrive home.  Keep all follow-up visits as told by your health care provider. This is important. Contact a health care provider if:  You have a fever.  You have redness, swelling, or yellow drainage around your insertion site. Get help right away if:  You have unusual pain at the radial site.  The catheter insertion area swells very fast.  The insertion area is bleeding, and the bleeding does not stop when you hold steady pressure on the area.  Your arm or hand becomes pale, cool, tingly, or numb. These symptoms may represent a serious problem that is an emergency. Do not wait to see if the symptoms will go away. Get medical help right away. Call your local emergency services (911 in the [place] Do not   drive yourself to the hospital. Summary  After the procedure, it is common to have bruising and tenderness at the site.  Follow instructions from your health care provider about how to take care of your radial site wound. Check the wound every day for signs of infection.  Do not lift anything that is heavier than 10 lb (4.5 kg), or the limit that you are told, until your health care provider says that it is safe. This information is not intended to replace advice given to you by your health care provider. Make sure you discuss any questions you have with your health care  provider. Document Revised: 11/08/2017 Document Reviewed: 11/08/2017 Elsevier Patient Education  2020 [employer]  

## 2020-03-12 NOTE — Progress Notes (Signed)
Patient and daughter was given discharge instructions. Both verbalized understanding. 

## 2020-03-19 NOTE — Progress Notes (Signed)
error 

## 2020-03-23 ENCOUNTER — Other Ambulatory Visit: Payer: Self-pay | Admitting: Oncology

## 2020-04-08 ENCOUNTER — Other Ambulatory Visit: Payer: Self-pay | Admitting: Primary Care

## 2020-04-08 ENCOUNTER — Other Ambulatory Visit: Payer: Self-pay | Admitting: Oncology

## 2020-04-08 ENCOUNTER — Other Ambulatory Visit (HOSPITAL_COMMUNITY): Payer: Self-pay | Admitting: Internal Medicine

## 2020-04-08 DIAGNOSIS — I10 Essential (primary) hypertension: Secondary | ICD-10-CM

## 2020-04-08 DIAGNOSIS — E890 Postprocedural hypothyroidism: Secondary | ICD-10-CM

## 2020-04-08 NOTE — Telephone Encounter (Addendum)
Please advise. Thought patient suppose to be going to endo    irbesartan-hydrochlorothiazide (AVALIDE) 150-12.5 MG tablet Last prescribed on 12/16/2019 Last OV (follow up) with Allie Bossier on 01/27/2020 No future OV scheduled

## 2020-04-08 NOTE — Telephone Encounter (Signed)
Pt left voicemail stating need for a " referral " to have port removed by surgeon, Dr Brantley Stage.   This note will be sent to MD and surgeon for coordination of care for port removal.

## 2020-04-10 NOTE — Telephone Encounter (Signed)
Please find out what happened to the endocrinology referral. It looks like they contacted her to schedule with Dr. Loanne Drilling.  Also, she is no longer on levothyroxine 175 mcg, will deny refill request.

## 2020-04-12 IMAGING — MR MR BILATERAL BREAST WITHOUT AND WITH CONTRAST
8 of 12 series · 30 of 48 positions shown · IV contrast (20ml Multihance)
Comparison: Previous exam(s).

CLINICAL DATA: 52-year-old female with biopsy proven grade 3
invasive ductal carcinoma in the medial right breast. The patient
also had abnormal axillary lymphadenopathy bilaterally which was
negative for metastatic disease upon biopsy. She is status post
remote bilateral breast reduction. Multiple skin boils are also
noted in both breasts at the time of her diagnostic workup.

LABS:  05/10/2018:
BUN 19
Creatinine
EXAM:
BILATERAL BREAST MRI WITH AND WITHOUT CONTRAST
TECHNIQUE: Multiplanar, multisequence MR images of both breasts were obtained
prior to and following the intravenous administration of 20 ml of
MultiHance.

[Series 3: t2_tirm_tra ipat (a-p) · axial · 3.0mm · 0.80mm/px · 1 of 60 slices shown]
[im 1/60]
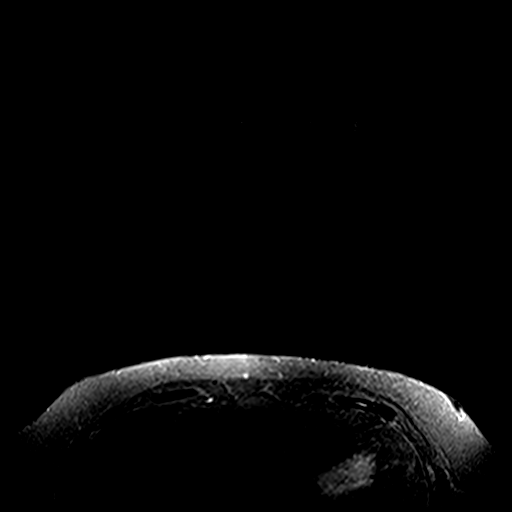

[Series 5: fl3d pre-cm no · axial · non-contrast · 1.2mm · 1.09mm/px · z∈[-87,+84]mm · 5 of 144 slices shown]
[im 1/144]
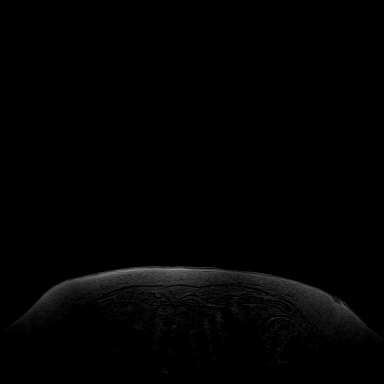
[im 36/144]
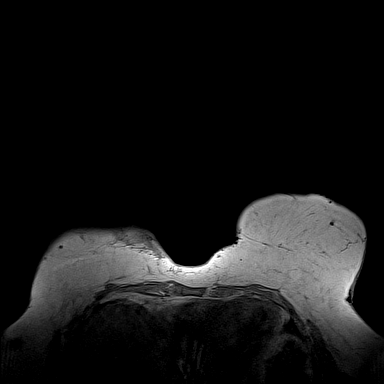
[im 72/144]
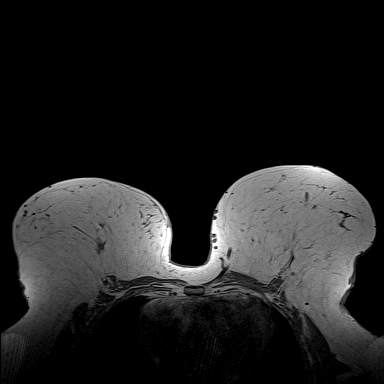
[im 108/144]
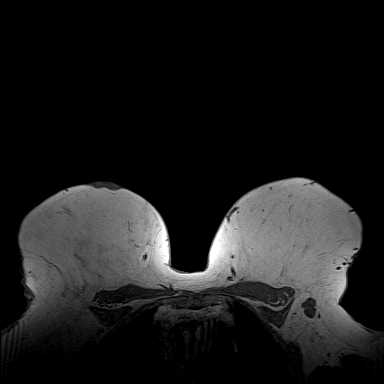
[im 144/144]
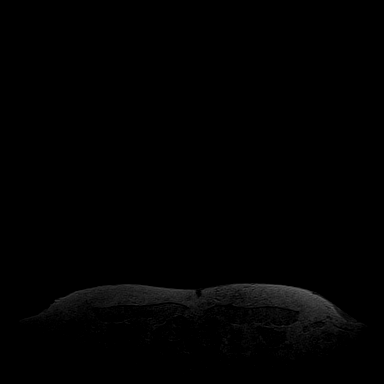

[Series 6: fl3d pre-cm · axial · non-contrast · 1.2mm · 1.09mm/px · z∈[-87,+84]mm · 5 of 144 slices shown]
[im 1/144]
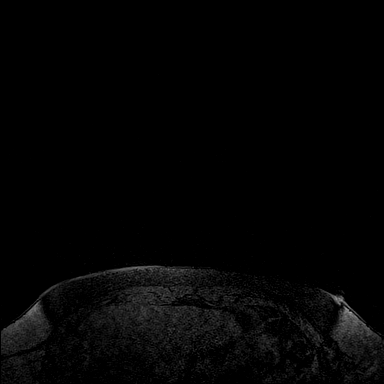
[im 36/144]
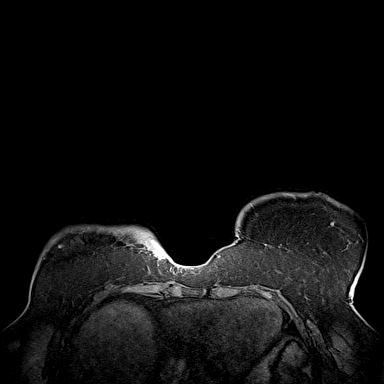
[im 72/144]
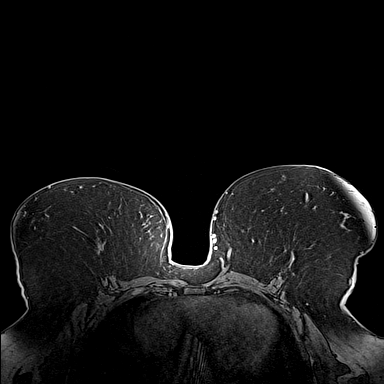
[im 108/144]
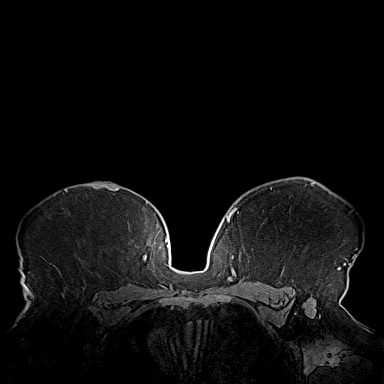
[im 144/144]
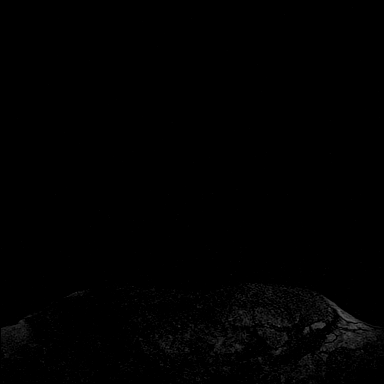

[Series 7: fl3d post-cm 20 · axial · 1.2mm · 1.09mm/px · z∈[-87,+84]mm · 5 of 144 slices shown (1 of 3)]
[im 1/144]
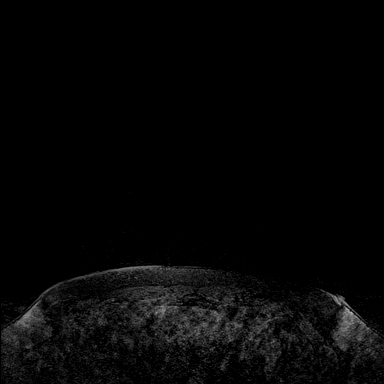
[im 36/144]
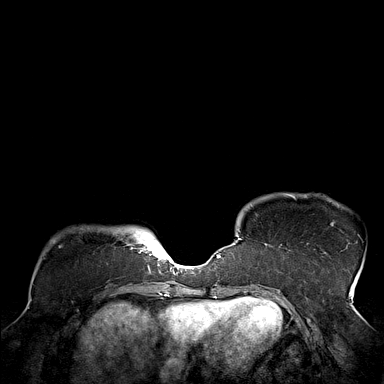
[im 72/144]
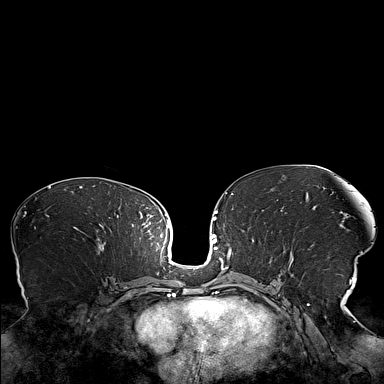
[im 108/144]
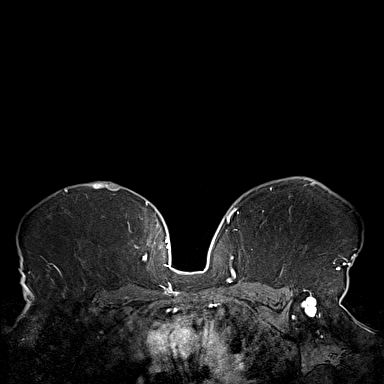
[im 144/144]
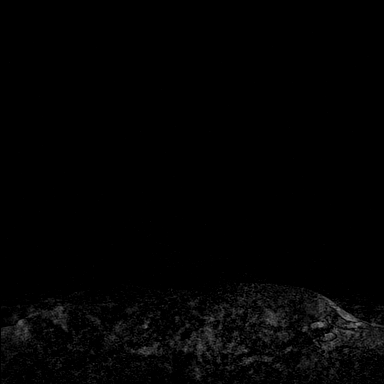

[Series 8: fl3d post-cm 20 · axial · 1.2mm · 1.09mm/px · z∈[-87,+84]mm · 5 of 144 slices shown (2 of 3)]
[im 1/144]
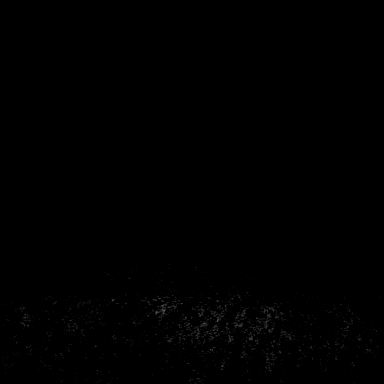
[im 36/144]
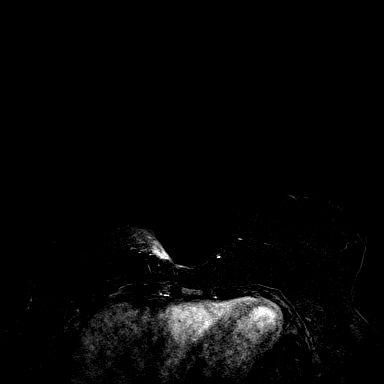
[im 72/144]
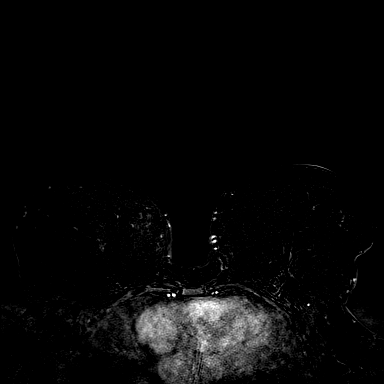
[im 108/144]
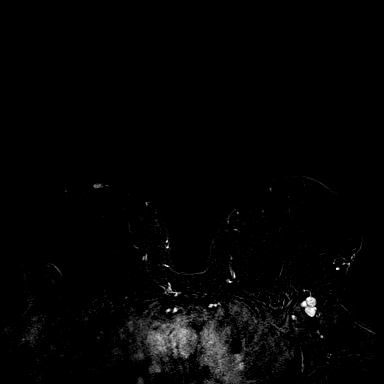
[im 144/144]
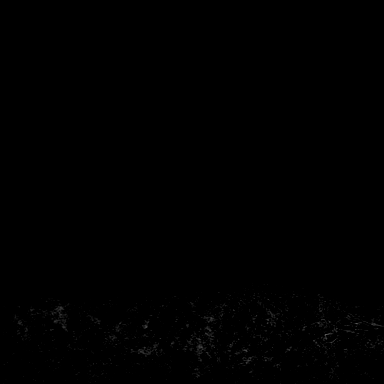

[Series 9: fl3d post-cm 20 · axial · 172.8mm · 1.09mm/px · 1 of 1 slices shown (3 of 3)]
[im 1/1]
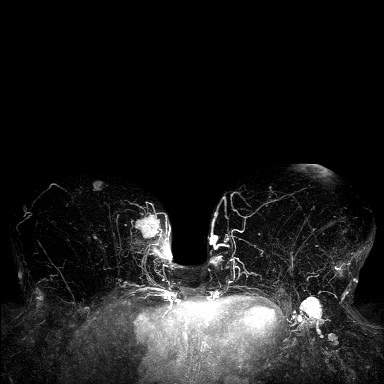

[Series 10: fl3d post-cm 3min · axial · 1.2mm · 1.09mm/px · z∈[-87,+84]mm · 6 of 144 slices shown]
[im 1/144]
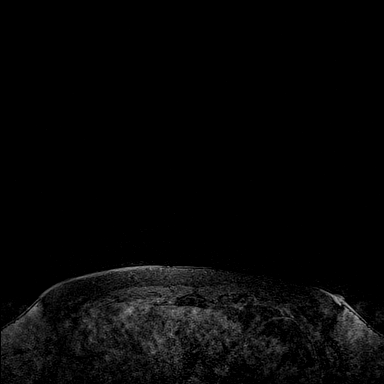
[im 29/144]
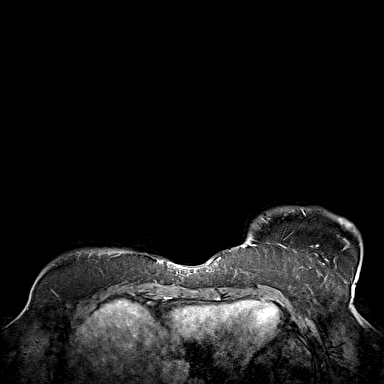
[im 58/144]
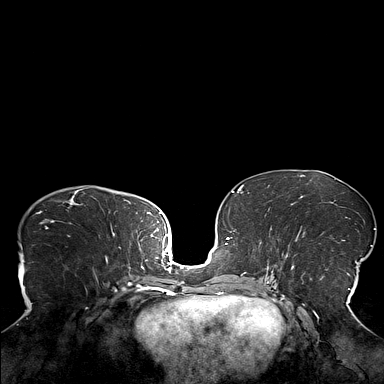
[im 86/144]
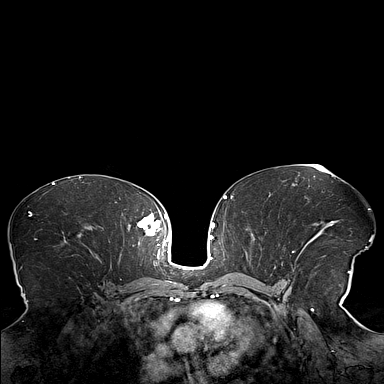
[im 115/144]
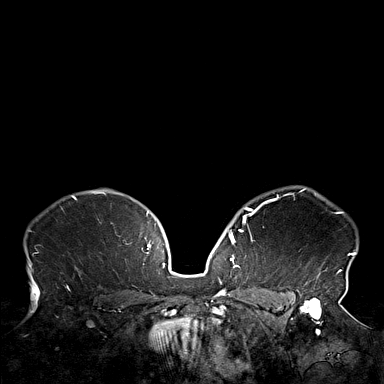
[im 144/144]
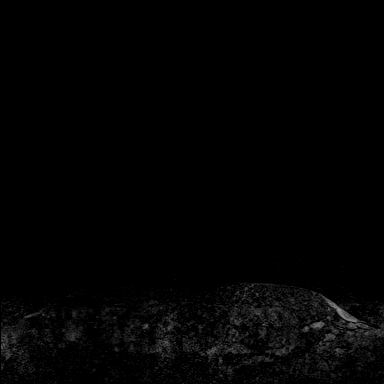

[Series 11: fl3d post-cm 3min_sub · axial · 1.2mm · 1.09mm/px · z∈[-87,-54]mm · 2 of 144 slices shown]
[im 1/144]
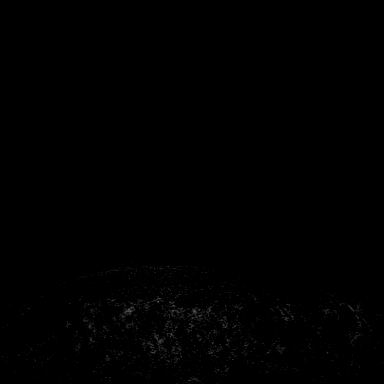
[im 29/144]
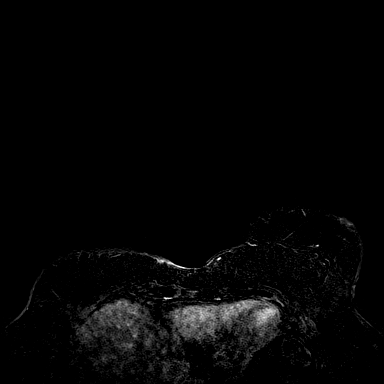

[30 of 48 positions shown; findings below may reference images not displayed]

THREE-DIMENSIONAL MR IMAGE RENDERING ON INDEPENDENT WORKSTATION:

Three-dimensional MR images were rendered by post-processing of the
original MR data on an independent workstation. The
three-dimensional MR images were interpreted, and findings are
reported in the following complete MRI report for this study. Three
dimensional images were evaluated at the independent [employer]
workstation
FINDINGS: Breast composition: a. Almost entirely fat.

Background parenchymal enhancement: Mild.

Right breast: Signal void from post biopsy tissue marker is seen in
association with a 2.6 x 2.5 x 2.4 cm enhancing, irregular mass in
central inner aspect of the right breast at middle depth. This is
consistent with the patient's biopsy-proven malignancy. An
additional 7 x 6 x 4 mm enhancing mass is seen in the central aspect
at middle depth. This is located approximately 5 cm lateral to the
index lesion, with similar T1 and T2 imaging characteristics.

Skin thickening and enhancement is noted along the right nipple,
which is a new finding from prior mammogram from 6992.

No additional suspicious mass or non mass enhancement is identified
within the right breast.

Left breast: No mass or abnormal enhancement within the left breast.

Lymph nodes: Morphologically abnormal lymph nodes are identified
within the bilateral axilla. They are associated signal voids from
post biopsy tissue markers within the most abnormal/largest lymph
node on each side.

Ancillary findings: Skin thickening and enhancement is identified in
several locations along the bilateral breasts. Particularly along
the inframammary folds both medially and laterally. This is likely
consistent with the patient's history of multiple skin boils.
IMPRESSION: 1. 2.6 x 2.5 x 2.4 cm enhancing mass consistent with the patient's
known biopsy-proven malignancy in the medial right breast.
2. Indeterminate 7 x 6 x 4 mm enhancing mass in the central right
breast at middle depth. This is located approximately 5 cm lateral
to the index lesion. Recommendation is for MRI guided biopsy.
3. Indeterminate skin thickening and enhancement along the right
nipple/areola. This is a new finding compared to a mammogram from
6992. Recommendation is for clinical evaluation and punch biopsy.
4. No suspicious MRI findings within the left breast.
5. Multiple foci of skin thickening and enhancement along the
bilateral inframammary folds likely represent skin boils as noted at
the time of diagnostic workup. Clinical correlation recommended.
6. Morphologically abnormal lymph nodes in the bilateral axilla with
associated post biopsy clips. This demonstrated benign pathology
bilaterally.

RECOMMENDATION:
1. MRI guided biopsy of an indeterminate subcentimeter enhancing
mass in the central right breast.
2. Clinical evaluation and punch biopsy of indeterminate skin
thickening and enhancement at the right nipple/areola.

BI-RADS CATEGORY  4: Suspicious.

## 2020-04-17 ENCOUNTER — Other Ambulatory Visit: Payer: Self-pay | Admitting: Primary Care

## 2020-04-17 DIAGNOSIS — E890 Postprocedural hypothyroidism: Secondary | ICD-10-CM

## 2020-04-17 MED ORDER — LEVOTHYROXINE SODIUM 200 MCG PO TABS: ORAL_TABLET | ORAL | 0 refills | Status: DC

## 2020-04-17 NOTE — Telephone Encounter (Signed)
Patient called about refill request She does not know why it is being denied and would like a call back

## 2020-04-17 NOTE — Telephone Encounter (Signed)
Spoken to patient and inform that we have her on 200 mcg not 175 mcg. She stated that she did not remember.  She asked if we could refill for patient this time. She promised she will call [place] to scheduled with Dr Loanne Drilling. She did not pick up her phone if it is number she does not know

## 2020-04-17 NOTE — Telephone Encounter (Signed)
Noted, prescription for levothyroxine 200 mcg sent to pharmacy.

## 2020-04-21 ENCOUNTER — Telehealth: Payer: Self-pay

## 2020-04-21 ENCOUNTER — Telehealth: Payer: Self-pay | Admitting: Pharmacy Technician

## 2020-04-21 ENCOUNTER — Encounter: Payer: Self-pay | Admitting: Plastic Surgery

## 2020-04-21 ENCOUNTER — Other Ambulatory Visit (HOSPITAL_COMMUNITY)
Admission: RE | Admit: 2020-04-21 | Discharge: 2020-04-21 | Disposition: A | Discharge status: Home / Self Care | Payer: 59 | Source: Ambulatory Visit | Attending: Plastic Surgery | Admitting: Plastic Surgery

## 2020-04-21 ENCOUNTER — Ambulatory Visit (INDEPENDENT_AMBULATORY_CARE_PROVIDER_SITE_OTHER): Payer: 59 | Admitting: Plastic Surgery

## 2020-04-21 ENCOUNTER — Other Ambulatory Visit: Payer: Self-pay

## 2020-04-21 DIAGNOSIS — L723 Sebaceous cyst: Secondary | ICD-10-CM | POA: Insufficient documentation

## 2020-04-21 HISTORY — DX: Sebaceous cyst: L72.3

## 2020-04-21 NOTE — Progress Notes (Signed)
Procedure Note  Preoperative Dx: Right cheek sebaceous cyst  Postoperative Dx: Same  Procedure: Excision of right cheek sebaceous cyst 1 cm  Anesthesia: Lidocaine 1% with 1:100,000 epinepherine   Description of Procedure: Risks and complications were explained to the patient.  Consent was confirmed and the patient understands the risks and benefits.  The potential complications and alternatives were explained and the patient consents.  The patient expressed understanding the option of not having the procedure and the risks of a scar.  Time out was called and all information was confirmed to be correct.    The area was prepped and drapped.  Lidocaine 1% with epinepherine was injected in the subcutaneous area.  After waiting several minutes for the local to take affect a #15 blade was used to incise the skin over the lesion.  The tissue scissors were used to dissect down the lesion and excise it completely.  The entire lesion was excised.  A 6-0 Monocryl was used to close the skin. A dressing was applied.  The patient was given instructions on how to care for the area and a follow up appointment.  Fidelis tolerated the procedure well and there were no complications.  Per the patient request the specimen was sent to pathology.

## 2020-04-21 NOTE — Telephone Encounter (Signed)
[facility] Patient Advocate Encounter   Was successful in obtaining a [employer] copay card for [name].  This copay card will make the patients copay $0.  I have spoken with the patient.    The billing information is as follows and has been shared with [facility].    [facility] Patient Advocate Encounter   Was successful in obtaining a Viiv copay card for [name].  This copay card will make the patients copay $0.  I have spoken with the patient.    The billing information is as follows and has been shared with [name].   [name]. [name] [name] Patient [name] Phone: [phone] Fax:  [phone]   [name]. [name] [name] Patient [name] Phone: [phone] Fax:  [phone]

## 2020-04-21 NOTE — Telephone Encounter (Signed)
Prior authorization form completed and faxed to [facility] health. Awaiting response.   Estaban [place] Lorita Officer, RN

## 2020-04-22 NOTE — Telephone Encounter (Signed)
PA approved thru 2024. Pharmacy notified.   Carlisia Geno Lorita Officer, RN

## 2020-04-23 LAB — SURGICAL PATHOLOGY

## 2020-04-25 ENCOUNTER — Other Ambulatory Visit: Payer: Self-pay | Admitting: Infectious Disease

## 2020-04-25 ENCOUNTER — Other Ambulatory Visit: Payer: Self-pay | Admitting: Primary Care

## 2020-04-25 DIAGNOSIS — M1A9XX0 Chronic gout, unspecified, without tophus (tophi): Secondary | ICD-10-CM

## 2020-04-25 DIAGNOSIS — E890 Postprocedural hypothyroidism: Secondary | ICD-10-CM

## 2020-04-27 NOTE — Telephone Encounter (Signed)
Is it ok to refill the prednisone for the patient

## 2020-04-28 NOTE — Telephone Encounter (Deleted)
Spoken to

## 2020-04-29 NOTE — Telephone Encounter (Signed)
Spoken to patient and she stated that she did not request refill of levothyroxine 200 mcg.  Patient stated that if Anda Kraft would refill of her prednisone but it looks like the request was sent to Dr Tommy Medal

## 2020-05-01 ENCOUNTER — Other Ambulatory Visit: Payer: Self-pay

## 2020-05-01 ENCOUNTER — Encounter: Payer: Self-pay | Admitting: Plastic Surgery

## 2020-05-01 ENCOUNTER — Ambulatory Visit (INDEPENDENT_AMBULATORY_CARE_PROVIDER_SITE_OTHER): Payer: 59 | Admitting: Plastic Surgery

## 2020-05-01 DIAGNOSIS — L720 Epidermal cyst: Secondary | ICD-10-CM | POA: Insufficient documentation

## 2020-05-01 MED ORDER — AMOXICILLIN-POT CLAVULANATE 500-125 MG PO TABS: 1.0000 | ORAL_TABLET | Freq: Three times a day (TID) | ORAL | 0 refills | Status: AC

## 2020-05-01 NOTE — Progress Notes (Signed)
The patient is a 54 year old female who was here for follow-up on skin excision.  She had removal of an epidermal inclusion cyst on her right cheek.  Today I removed the sutures.  There seems to be a little bit of drainage.  It does not look so much infected and is draining some.  There may be some residual cyst or new cyst trying to develop.  I am going to have her put antibiotic ointment on there for a few days and then switch to Vaseline.  I will also call in an antibiotic.  She is also interested in a panniculectomy and we will see her back in a week for evaluation of that area.

## 2020-05-06 ENCOUNTER — Other Ambulatory Visit: Payer: Self-pay | Admitting: Oncology

## 2020-05-07 MED ORDER — PREDNISONE 20 MG PO TABS: ORAL_TABLET | ORAL | 0 refills | Status: DC

## 2020-05-07 NOTE — Telephone Encounter (Signed)
Please notify patient that I will refill her prednisone. I need to know if she continues to get gout flares, treatment is different, may need preventative treatment. Did she get in touch with endocrinology for her thyroid?

## 2020-05-07 NOTE — Telephone Encounter (Signed)
Pt called back in regards to the prednisone. Dr Lucianne Lei dam has never responded to the refill request. He happened to be the Dr on Call when she had the initial flare-up and did the rx. She is needing another round. He told her to see Anda Kraft, as she did. The labs showed she had gout.  Please refill prednisone to [facility] [address] [facility] [address] [address] [facility] [address]). Please call pt [phone] if this cannot be done.

## 2020-05-08 ENCOUNTER — Other Ambulatory Visit: Payer: Self-pay | Admitting: Internal Medicine

## 2020-05-08 ENCOUNTER — Telehealth: Payer: Self-pay

## 2020-05-08 DIAGNOSIS — B20 Human immunodeficiency virus [HIV] disease: Secondary | ICD-10-CM

## 2020-05-08 MED ORDER — FLUCONAZOLE 100 MG PO TABS: 100.0000 mg | ORAL_TABLET | Freq: Every day | ORAL | 3 refills | Status: DC

## 2020-05-08 NOTE — Telephone Encounter (Signed)
Prescription sent

## 2020-05-08 NOTE — Telephone Encounter (Signed)
Patient called office today requesting prescription prescription for Thrush. States two days ago patient noticed tongue sensitivity after taking Amoxicillin. Woke up this morning and noticed rash on tongue and is having issues eating. [facility] [place].  Patient is planning on being in [place] until Tuesday.  Will forward message to MD to advise on medication. Will also contact PCP regarding prescription. [place]

## 2020-05-11 NOTE — Telephone Encounter (Signed)
Message left for patient to return my call.  

## 2020-05-12 ENCOUNTER — Other Ambulatory Visit: Payer: Self-pay

## 2020-05-12 ENCOUNTER — Ambulatory Visit (INDEPENDENT_AMBULATORY_CARE_PROVIDER_SITE_OTHER): Payer: 59 | Admitting: Plastic Surgery

## 2020-05-12 ENCOUNTER — Encounter: Payer: Self-pay | Admitting: Plastic Surgery

## 2020-05-12 DIAGNOSIS — M793 Panniculitis, unspecified: Secondary | ICD-10-CM | POA: Diagnosis not present

## 2020-05-12 HISTORY — DX: Panniculitis, unspecified: M79.3

## 2020-05-12 NOTE — Progress Notes (Signed)
Patient ID: [name], female    DOB: [birth year], 54 y.o.   MRN: [id number]   Chief Complaint  Patient presents with  . Consult  . Skin Problem    The patient is a 54 year old female here for evaluation of her abdomen.  The patient has noticed an increase in her back pain over the last couple of years.  She has not had physical therapy yet but is interested in seeking an evaluation.  She has noticed that even when she loses weight she has a pannus that hangs.  She is 5 feet 5 inches tall and weighs 237 pounds.  She has a history of diabetes.  Her last hemoglobin A1c was 8.0 and that was from April 2021.  She also has gout and kidney disease.  Her skin folds are deep and she gets rashes and skin breakdown at times.  She has a history of HIV, sarcoidosis and hidradenitis.  She is not currently smoking but did smell of smoke today.  She explained that it was her daughter who was smoking in her car.  She has a history of several surgeries including breast surgery and cardiac catheterization.   Review of Systems  Constitutional: Negative.   HENT: Negative.   Eyes: Negative.   Respiratory: Negative.   Cardiovascular: Negative.   Endocrine: Negative.   Genitourinary: Negative.   Musculoskeletal: Negative.   Hematological: Negative.     Past Medical History:  Diagnosis Date  . Allergy   . Anemia    Normocytic  . Anxiety   . Asthma   . Blood dyscrasia   . Bronchitis 2005  . 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 Diabetes mellitus without complication ([place])   . Family history of breast cancer   . Family history of lung cancer   . Family history of prostate cancer   . Gastroenteritis 07/10/07  . GERD 07/24/2006  . GRAVE'S DISEASE 01/01/2008  . History of hidradenitis suppurativa   . History of kidney stones   . History of thrush   . HIV DISEASE 07/24/2006   dx March 05  . Hyperlipidemia   . HYPERTENSION 07/24/2006  . Hyperthyroidism 08/2006   Grave's Disease  -diffuse radiotracer uptake 08/25/06 Thyroid scan-Cold nodule to R lower lobe of thyrorid  . Menometrorrhagia    hx of  . Nephrolithiasis   . 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 Personal history of chemotherapy    2020  . Personal history of radiation therapy    2020  . Pneumonia 2005  . 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 Sarcoidosis 02/08/2007   dx as a teenager in [place] from abnl CXR. Completed 2 yrs Prednisone after lung bx confirmation. No symptoms since then.  . Suppurative hidradenitis   . Thyroid cancer ([place])   . THYROID NODULE, RIGHT 02/08/2007    Past Surgical History:  Procedure Laterality Date  . BREAST EXCISIONAL BIOPSY Right 04/26/2018   right axilla negative  . BREAST EXCISIONAL BIOPSY Left 04/26/2018   left axilla negative  . BREAST LUMPECTOMY Right 10/03/2018   malignant  . BREAST LUMPECTOMY WITH RADIOACTIVE SEED AND SENTINEL LYMPH NODE BIOPSY Right 10/03/2018   Procedure: RIGHT BREAST LUMPECTOMY WITH RADIOACTIVE SEED AND SENTINEL LYMPH NODE MAPPING;  Surgeon: Erroll Luna, MD;  Location: [place];  Service: General;  Laterality: Right;  . BREAST SURGERY  1997   Breast Reduction   . CYSTOSCOPY W/ URETERAL STENT REMOVAL  11/09/2012  Procedure: CYSTOSCOPY WITH STENT REMOVAL;  Surgeon: Sebastian Ache, MD;  Location: [facility];  Service: Urology;  Laterality: Right;  . CYSTOSCOPY WITH RETROGRADE PYELOGRAM, URETEROSCOPY AND STENT PLACEMENT  11/09/2012   Procedure: CYSTOSCOPY WITH RETROGRADE PYELOGRAM, URETEROSCOPY AND STENT PLACEMENT;  Surgeon: Sebastian Ache, MD;  Location: [facility];  Service: Urology;  Laterality: Left;  LEFT URETEROSCOPY, STONE MANIPULATION, left STENT exchange   . CYSTOSCOPY WITH STENT PLACEMENT  10/02/2012   Procedure: CYSTOSCOPY WITH STENT PLACEMENT;  Surgeon: Sebastian Ache, MD;  Location: [facility];  Service: Urology;  Laterality: Left;  . DILATION AND CURETTAGE OF UTERUS  Feb  2004   s/p for 1st trimester nonviable pregnancy  . EYE SURGERY     sty under eyelid  . INCISE AND DRAIN ABCESS  Nov 03   s/p I &D for righ inframmary fold hidradenitis  . INCISION AND DRAINAGE PERITONSILLAR ABSCESS  Mar 03  . IR CV LINE INJECTION  06/07/2018  . IR IMAGING GUIDED PORT INSERTION  06/20/2018  . IR REMOVAL TUN ACCESS W/ PORT W/O FL MOD SED  06/20/2018  . IRRIGATION AND DEBRIDEMENT ABSCESS  01/31/2012   Procedure: IRRIGATION AND DEBRIDEMENT ABSCESS;  Surgeon: Kandis Cocking, MD;  Location: [facility];  Service: General;  Laterality: Right;  right breast and axilla   . NEPHROLITHOTOMY  10/02/2012   Procedure: NEPHROLITHOTOMY PERCUTANEOUS;  Surgeon: Sebastian Ache, MD;  Location: [facility];  Service: Urology;  Laterality: Right;  First Stage Percutaneous Nephrolithotomy with Surgeon Access, Left Ureteral Stent    . NEPHROLITHOTOMY  10/04/2012   Procedure: NEPHROLITHOTOMY PERCUTANEOUS SECOND LOOK;  Surgeon: Sebastian Ache, MD;  Location: [facility];  Service: Urology;  Laterality: Right;     . NEPHROLITHOTOMY  10/08/2012   Procedure: NEPHROLITHOTOMY PERCUTANEOUS;  Surgeon: Sebastian Ache, MD;  Location: [facility];  Service: Urology;  Laterality: Right;  THIRD STAGE, nephrostomy tube exchange x 2  . NEPHROLITHOTOMY  10/11/2012   Procedure: NEPHROLITHOTOMY PERCUTANEOUS SECOND LOOK;  Surgeon: Sebastian Ache, MD;  Location: [facility];  Service: Urology;  Laterality: Right;  RIGHT 4 STAGE PERCUTANOUS NEPHROLITHOTOMY, right URETEROSCOPY WITH HOLMIUM LASER   . PORTACATH PLACEMENT Left 05/17/2018   Procedure: INSERTION PORT-A-CATH;  Surgeon: Abigail Miyamoto, MD;  Location: [facility];  Service: General;  Laterality: Left;  . RADICAL NECK DISSECTION  04/12/2017   limited/notes 04/12/2017  . RADICAL NECK DISSECTION N/A 04/12/2017   Procedure: RADICAL NECK DISSECTION;  Surgeon: Christia Reading, MD;  [facility];  Service: ENT;  Laterality: N/A;  limited neck dissection 2 hours total  . REDUCTION  MAMMAPLASTY Bilateral 1998  . RIGHT/LEFT HEART CATH AND CORONARY ANGIOGRAPHY N/A 03/12/2020   Procedure: RIGHT/LEFT HEART CATH AND CORONARY ANGIOGRAPHY;  Surgeon: Dolores Patty, MD;  Location: [facility];  Service: Cardiovascular;  Laterality: N/A;  . [place]  1994  . THYROIDECTOMY  04/12/2017   completion/notes 04/12/2017  . THYROIDECTOMY N/A 04/12/2017   Procedure: THYROIDECTOMY;  Surgeon: Christia Reading, MD;  Location[facility];  Service: ENT;  Laterality: N/A;  Completion Thyroidectomy  . TOTAL THYROIDECTOMY  2010      Current Outpatient Medications:  .  Blood Glucose Monitoring Suppl (ONETOUCH VERIO) w/Device KIT, USE AS INSTRUCTED TO TEST BLOOD SUGAR UP TO 4 TIMES DAILY E11.9, Disp: 1 kit, Rfl: 0 .  dapagliflozin propanediol (FARXIGA) 10 MG TABS tablet, Take 10 mg by mouth daily before breakfast. Start the day after your heart cath, Disp: 30 tablet, Rfl: 5 .  dolutegravir ([name]) 50  MG tablet, Take 1 tablet (50 mg total) by mouth daily., Disp: 30 tablet, Rfl: 2 .  Dulaglutide (TRULICITY) 0.73 XT/0.6YI SOPN, Inject 0.75 mg into the skin once a week. For diabetes., Disp: 2 mL, Rfl: 3 .  emtricitabine-tenofovir AF (DESCOVY) 200-25 MG tablet, Take 1 tablet by mouth daily., Disp: 30 tablet, Rfl: 2 .  FLOVENT HFA 110 MCG/ACT inhaler, TAKE 1 PUFF BY MOUTH TWICE A DAY (Patient taking differently: Inhale 1 puff into the lungs daily as needed (Shortness of breath). ), Disp: 36 Inhaler, Rfl: 1 .  fluconazole (DIFLUCAN) 100 MG tablet, Take 1 tablet (100 mg total) by mouth daily., Disp: 7 tablet, Rfl: 3 .  gabapentin (NEURONTIN) 300 MG capsule, Take 1 capsule (300 mg total) by mouth 3 (three) times daily. (Patient taking differently: Take 300 mg by mouth 2 (two) times daily. ), Disp: 90 capsule, Rfl: 3 .  glipiZIDE (GLUCOTROL) 5 MG tablet, TAKE 1 TABLET (5 MG TOTAL) BY MOUTH 2 (TWO) TIMES DAILY BEFORE A MEAL. FOR DIABETES. (Patient taking differently: Take 5 mg by mouth daily. ), Disp: 180  tablet, Rfl: 1 .  glucose blood (ONETOUCH VERIO) test strip, USE AS INSTRUCTED TO TEST BLOOD SUGAR DAILY, Disp: 100 each, Rfl: 2 .  irbesartan-hydrochlorothiazide (AVALIDE) 150-12.5 MG tablet, TAKE 1 TABLET BY MOUTH EVERY DAY FOR BLOOD PRESSURE, Disp: 90 tablet, Rfl: 2 .  Lancets (ONETOUCH ULTRASOFT) lancets, Use as instructed to test blood sugar daily, Disp: 100 each, Rfl: 5 .  levothyroxine (SYNTHROID) 200 MCG tablet, Take 1 tablet by mouth every morning on an empty stomach with water only.  No food or other medications for 30 minutes., Disp: 30 tablet, Rfl: 0 .  metoprolol succinate (TOPROL-XL) 50 MG 24 hr tablet, TAKE 1 TABLET (50 MG TOTAL) BY MOUTH DAILY. TAKE WITH OR IMMEDIATELY FOLLOWING A MEAL., Disp: 90 tablet, Rfl: 0 .  Multiple Vitamins-Minerals (MULTIVITAMIN WITH MINERALS) tablet, Take 1 tablet by mouth daily., Disp: , Rfl:  .  predniSONE (DELTASONE) 20 MG tablet, Take 2 tablets daily for three days, then 1 tablet daily for three days for gout flare., Disp: 9 tablet, Rfl: 0 .  rosuvastatin (CRESTOR) 10 MG tablet, TAKE 1 TABLET BY MOUTH EVERY DAY FOR CHOLESTEROL (Patient taking differently: Take 10 mg by mouth daily. FOR CHOLESTEROL), Disp: 90 tablet, Rfl: 3 .  spironolactone (ALDACTONE) 25 MG tablet, TAKE 1 TABLET BY MOUTH EVERY DAY, Disp: 90 tablet, Rfl: 3 .  tamoxifen (NOLVADEX) 20 MG tablet, Take 1 tablet (20 mg total) by mouth daily., Disp: 90 tablet, Rfl: 1 No current facility-administered medications for this visit.  Facility-Administered Medications Ordered in Other Visits:  .  sodium chloride flush (NS) 0.9 % injection 10 mL, 10 mL, Intracatheter, PRN, Magrinat, Virgie Dad, MD, 10 mL at 09/19/18 1551   Objective:   Vitals:   05/12/20 1122 05/12/20 1129  BP: (!) 159/106 (!) 177/108  Pulse: 83   SpO2: 99%     Physical Exam Vitals and nursing note reviewed.  Constitutional:      Appearance: Normal appearance.  HENT:     Head: Normocephalic and atraumatic.  Eyes:      Extraocular Movements: Extraocular movements intact.  Cardiovascular:     Rate and Rhythm: Normal rate.     Pulses: Normal pulses.  Pulmonary:     Effort: No respiratory distress.     Breath sounds: No wheezing.  Abdominal:     General: Abdomen is flat. There is no distension.  Palpations: There is no mass.     Tenderness: There is no abdominal tenderness.     Hernia: No hernia is present.  Skin:    General: Skin is warm.     Capillary Refill: Capillary refill takes less than 2 seconds.  Neurological:     General: No focal deficit present.     Mental Status: She is alert and oriented to person, place, and time.  Psychiatric:        Mood and Affect: Mood normal.        Behavior: Behavior normal.     Assessment & Plan:  Panniculitis  Recommend physical therapy for relief or attempts at relief of the back pain.  Recommend panniculectomy. Any surgery done must be taken very seriously with this patient considering her comorbidities.  She is at a very high risk of complications.  These include bleeding, pain, scarring, wound breakdown, trouble healing and protracted wound care.  I will have to talk with her further at length prior to scheduling any surgery because these complications are extremely likely.  Pictures were obtained of the patient and placed in the chart with the patient's or guardian's permission.  [place], DO

## 2020-05-18 ENCOUNTER — Other Ambulatory Visit: Payer: Self-pay | Admitting: Primary Care

## 2020-05-19 ENCOUNTER — Other Ambulatory Visit: Payer: Self-pay | Admitting: Primary Care

## 2020-05-19 DIAGNOSIS — E890 Postprocedural hypothyroidism: Secondary | ICD-10-CM

## 2020-05-25 ENCOUNTER — Telehealth: Payer: Self-pay

## 2020-05-25 ENCOUNTER — Telehealth: Payer: Self-pay | Admitting: Primary Care

## 2020-05-25 DIAGNOSIS — E890 Postprocedural hypothyroidism: Secondary | ICD-10-CM

## 2020-05-25 NOTE — Telephone Encounter (Signed)
Patient left a voicemail stating that she wants Vallarie Mare to call her about her thyroid medication.

## 2020-05-25 NOTE — Telephone Encounter (Signed)
Pt called and LVM stating she needs assistance with having appt scheduled with [facility] to have port removed.   This nurse called [facility] and spoke with Sharyn Lull, RN who states pt will need a visit before being scheduled for port removal, and pt was [place]/NS to last scheduled appt in 12/2018.   Returned call to pt, who did not answer and LVM stating pt needs to call [facility] to schedule this appt., with instructions to call us back if she needs further assistance.

## 2020-05-26 MED ORDER — LEVOTHYROXINE SODIUM 200 MCG PO TABS: 200.0000 ug | ORAL_TABLET | Freq: Every day | ORAL | 0 refills | Status: DC

## 2020-05-26 NOTE — Addendum Note (Signed)
Addended by: Jearld Fenton on: 05/26/2020 01:34 PM   Modules accepted: Orders

## 2020-05-26 NOTE — Telephone Encounter (Signed)
Medication refilled, referral placed. She needs to follow up with endocrinology.

## 2020-05-26 NOTE — Telephone Encounter (Signed)
Patient stated that she ran out of her levothyroxine 200 mcg. She also wanted to know why she always had to call the pharmacy and been told need doctor authorization. Refill was sent for 30 tablets.  I have inform patient that prior to this, patient was supposed to get connected with endo. She stated that she been having problems with different things including insurance and have not done it.  She stated that would it be possible to place a referral for endo again and refill until she can be seen. Inform patient that Allie Bossier is out of the office until 06/03/2020.

## 2020-05-26 NOTE — Telephone Encounter (Signed)
Spoken and notified patient of [name] comments. Verbalized understanding.

## 2020-05-27 DIAGNOSIS — M7542 Impingement syndrome of left shoulder: Secondary | ICD-10-CM | POA: Insufficient documentation

## 2020-06-02 ENCOUNTER — Ambulatory Visit: Payer: 59 | Attending: Plastic Surgery | Admitting: Physical Therapy

## 2020-06-02 ENCOUNTER — Other Ambulatory Visit: Payer: Self-pay

## 2020-06-02 ENCOUNTER — Encounter: Payer: Self-pay | Admitting: Physical Therapy

## 2020-06-02 DIAGNOSIS — M6281 Muscle weakness (generalized): Secondary | ICD-10-CM | POA: Insufficient documentation

## 2020-06-02 DIAGNOSIS — M545 Low back pain, unspecified: Secondary | ICD-10-CM

## 2020-06-02 NOTE — Patient Instructions (Signed)
Access Code: [email or web address]: [email or web address]: 08/17/2021Prepared by: Anderson Malta PaaExercises  Supine Hamstring Stretch with Strap - 1-2 x daily - 7 x weekly - 1 sets - 3 reps - 30 hold  Supine Lower Trunk Rotation - 1-2 x daily - 7 x weekly - 1 sets - 10 reps - 10 hold  Hooklying Single Knee to Chest Stretch - 1-2 x daily - 7 x weekly - 1 sets - 3 reps - 30 hold  Cat-Camel to Child's Pose - 1-2 x daily - 7 x weekly - 1 sets - 3 reps - 15-30 hold  Seated Figure 4 Piriformis Stretch - 1-2 x daily - 7 x weekly - 2 sets - 3 reps - 30 hold

## 2020-06-03 NOTE — Therapy (Signed)
[place], [place], [address] Phone: [phone]   Fax:  [phone]  Physical Therapy Evaluation  Patient Details  Name: [name] MRN: [id number] Date of Birth: 09/26/1966 Referring Provider (PT): Dr. Audelia Hives    Encounter Date: 06/02/2020   PT End of Session - 06/03/20 0744    Visit Number 1    Number of Visits 12    Date for PT Re-Evaluation 07/29/20   allow 8 weeks for scheduling   Authorization Type [employer]    PT Start Time 4709    PT Stop Time 1530    PT Time Calculation (min) 45 min    Activity Tolerance Patient tolerated treatment well    Behavior During Therapy [facility] for tasks assessed/performed           Past Medical History:  Diagnosis Date  . Allergy   . Anemia    Normocytic  . Anxiety   . Asthma   . Blood dyscrasia   . Bronchitis 2005  . 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 Diabetes mellitus without complication ([place])   . Family history of breast cancer   . Family history of lung cancer   . Family history of prostate cancer   . Gastroenteritis 07/10/07  . GERD 07/24/2006  . GRAVE'S DISEASE 01/01/2008  . History of hidradenitis suppurativa   . History of kidney stones   . History of thrush   . HIV DISEASE 07/24/2006   dx March 05  . Hyperlipidemia   . HYPERTENSION 07/24/2006  . Hyperthyroidism 08/2006   Grave's Disease -diffuse radiotracer uptake 08/25/06 Thyroid scan-Cold nodule to R lower lobe of thyrorid  . Menometrorrhagia    hx of  . Nephrolithiasis   . 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 Personal history of chemotherapy    2020  . Personal history of radiation therapy    2020  . Pneumonia 2005  . 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 Sarcoidosis 02/08/2007   dx as a teenager in [place] from abnl CXR. Completed 2 yrs Prednisone after lung bx confirmation. No  symptoms since then.  . Suppurative hidradenitis   . Thyroid cancer ([place])   . THYROID NODULE, RIGHT 02/08/2007    Past Surgical History:  Procedure Laterality Date  . BREAST EXCISIONAL BIOPSY Right 04/26/2018   right axilla negative  . BREAST EXCISIONAL BIOPSY Left 04/26/2018   left axilla negative  . BREAST LUMPECTOMY Right 10/03/2018   malignant  . BREAST LUMPECTOMY WITH RADIOACTIVE SEED AND SENTINEL LYMPH NODE BIOPSY Right 10/03/2018   Procedure: RIGHT BREAST LUMPECTOMY WITH RADIOACTIVE SEED AND SENTINEL LYMPH NODE MAPPING;  Surgeon: Erroll Luna, MD;  Location: [facility];  Service: General;  Laterality: Right;  . BREAST SURGERY  1997   Breast Reduction   . CYSTOSCOPY W/ URETERAL STENT REMOVAL  11/09/2012   Procedure: CYSTOSCOPY WITH STENT REMOVAL;  Surgeon: Alexis Frock, MD;  Location: [facility];  Service: Urology;  Laterality: Right;  . CYSTOSCOPY WITH RETROGRADE PYELOGRAM, URETEROSCOPY AND STENT PLACEMENT  11/09/2012   Procedure: CYSTOSCOPY WITH RETROGRADE PYELOGRAM, URETEROSCOPY AND STENT PLACEMENT;  Surgeon: Alexis Frock, MD;  Location[facility];  Service: Urology;  Laterality: Left;  LEFT URETEROSCOPY, STONE MANIPULATION, left STENT exchange   . CYSTOSCOPY WITH STENT PLACEMENT  10/02/2012   Procedure: CYSTOSCOPY WITH STENT PLACEMENT;  Surgeon: Alexis Frock, MD;  Location: [facility];  Service: Urology;  Laterality: Left;  . DILATION AND CURETTAGE OF  UTERUS  Feb 2004   s/p for 1st trimester nonviable pregnancy  . EYE SURGERY     sty under eyelid  . INCISE AND DRAIN ABCESS  Nov 03   s/p I &D for righ inframmary fold hidradenitis  . INCISION AND DRAINAGE PERITONSILLAR ABSCESS  Mar 03  . IR CV LINE INJECTION  06/07/2018  . IR IMAGING GUIDED PORT INSERTION  06/20/2018  . IR REMOVAL TUN ACCESS W/ PORT W/O FL MOD SED  06/20/2018  . IRRIGATION AND DEBRIDEMENT ABSCESS  01/31/2012   Procedure: IRRIGATION AND DEBRIDEMENT ABSCESS;  Surgeon: Shann Medal, MD;  [facility];  Service:  General;  Laterality: Right;  right breast and axilla   . NEPHROLITHOTOMY  10/02/2012   Procedure: NEPHROLITHOTOMY PERCUTANEOUS;  Surgeon: Alexis Frock, MD;  [facility];  Service: Urology;  Laterality: Right;  First Stage Percutaneous Nephrolithotomy with Surgeon Access, Left Ureteral Stent    . NEPHROLITHOTOMY  10/04/2012   Procedure: NEPHROLITHOTOMY PERCUTANEOUS SECOND LOOK;  Surgeon: Alexis Frock, MD;  [facility];  Service: Urology;  Laterality: Right;     . NEPHROLITHOTOMY  10/08/2012   Procedure: NEPHROLITHOTOMY PERCUTANEOUS;  Surgeon: Alexis Frock, MD;  [facility];  Service: Urology;  Laterality: Right;  THIRD STAGE, nephrostomy tube exchange x 2  . NEPHROLITHOTOMY  10/11/2012   Procedure: NEPHROLITHOTOMY PERCUTANEOUS SECOND LOOK;  Surgeon: Alexis Frock, MD;  [facility];  Service: Urology;  Laterality: Right;  RIGHT 4 STAGE PERCUTANOUS NEPHROLITHOTOMY, right URETEROSCOPY WITH HOLMIUM LASER   . PORTACATH PLACEMENT Left 05/17/2018   Procedure: INSERTION PORT-A-CATH;  Surgeon: Coralie Keens, MD;  Location: [facility];  Service: General;  Laterality: Left;  . RADICAL NECK DISSECTION  04/12/2017   limited/notes 04/12/2017  . RADICAL NECK DISSECTION N/A 04/12/2017   Procedure: RADICAL NECK DISSECTION;  Surgeon: Melida Quitter, MD;  Location: [facility];  Service: ENT;  Laterality: N/A;  limited neck dissection 2 hours total  . REDUCTION MAMMAPLASTY Bilateral 1998  . RIGHT/LEFT HEART CATH AND CORONARY ANGIOGRAPHY N/A 03/12/2020   Procedure: RIGHT/LEFT HEART CATH AND CORONARY ANGIOGRAPHY;  Surgeon: Jolaine Artist, MD;  Location: [facility];  Service: Cardiovascular;  Laterality: N/A;  . [place]  1994  . THYROIDECTOMY  04/12/2017   completion/notes 04/12/2017  . THYROIDECTOMY N/A 04/12/2017   Procedure: THYROIDECTOMY;  Surgeon: Melida Quitter, MD;  Location: [facility];  Service: ENT;  Laterality: N/A;  Completion Thyroidectomy  . TOTAL THYROIDECTOMY   2010    There were no vitals filed for this visit.    Subjective Assessment - 06/02/20 1454    Subjective Patient complains of intermittent back pain.  She feels it is related to excess skin in abdominal region.  Has lost weight due to chemo/radiatoin treatment.  She has trouble doing dishes, standing > 15 min.  Rt leg feels like it may buckle when she walks.  She has difficulty sitting too long and lifting.  Hard to get comfortable. Her symptoms have been going on for 5 + years.  It increased when she finished treatment .    Pertinent History chemo/radiation from Breast cancer, diabetes, see snapshot for extensive list of medical issues    Limitations Sitting;House hold activities;Lifting;Standing;Walking    How long can you stand comfortably? 10-15 min    How long can you walk comfortably? not sure, does not walk for long, fitness    Diagnostic tests XR done in 2020 normal in L spine    Patient Stated Goals  Patient would like to be able to feel better    Currently in Pain? No/denies   none currently   Pain Score 5     Pain Location Back    Pain Orientation Right;Left;Lower    Pain Descriptors / Indicators Aching    Pain Type Chronic pain    Pain Radiating Towards post hip, glutes    Pain Onset More than a month ago    Pain Frequency Intermittent    Aggravating Factors  activity    Pain Relieving Factors rest    Effect of Pain on Daily Activities limits quality of life              [facility] PT Assessment - 06/03/20 0001      Assessment   Medical Diagnosis back pain , panniculitis    Referring Provider (PT) Dr. Lyndee Leo Dillingham     Onset Date/Surgical Date --   chronic    Next MD Visit not known     Prior Therapy Cancer       Precautions   Precautions Other (comment)    Precaution Comments cancer       Restrictions   Weight Bearing Restrictions No      Balance Screen   Has the patient fallen in the past 6 months Yes    How many times? 1   admits to stumbling, about 3  falls over 1-2 yrs    Has the patient had a decrease in activity level because of a fear of falling?  Yes    Is the patient reluctant to leave their home because of a fear of falling?  No      Home Environment   Living Environment Private residence    Living Arrangements Children    Available Help at Discharge Family    Type of [place] to enter    Entrance Stairs-Number of Steps 2    Home Layout Two level    Alternate Level Stairs-Number of Steps 12    Alternate Level Stairs-Rails Right;Left      Prior Function   Level of Independence Independent with basic ADLs;Independent with household mobility without device;Independent with community mobility without device    Vocation Unemployed    Leisure music, TV, hangs with daughter      Cognition   Overall Cognitive Status Within Functional Limits for tasks assessed      Observation/Other Assessments   Focus on Therapeutic Outcomes (FOTO)  NT body part       Sensation   Light Touch Appears Intact      Coordination   Gross Motor Movements are Fluid and Coordinated No      Posture/Postural Control   Posture/Postural Control Postural limitations    Postural Limitations Rounded Shoulders;Forward head;Decreased lumbar lordosis      AROM   Lumbar Flexion reaches to knee    Lumbar Extension 50%     Lumbar - Right Side Bend 50%   no pain    Lumbar - Left Side Bend 50%    no pain    Lumbar - Right Rotation 25%     Lumbar - Left Rotation 25%       Strength   Right Hip Flexion 4/5    Right Hip ABduction 4/5    Left Hip Flexion 4/5    Left Hip ABduction 4/5    Right Knee Flexion 5/5    Right Knee Extension 4+/5    Left Knee Flexion 5/5  Left Knee Extension 5/5      Palpation   Palpation comment pain along low lumbar L4-L5 , min to none in gluteals      Special Tests   Other special tests pain with SLR at 25-30 deg bilateral                       Objective measurements completed on  examination: See above findings.               PT Education - 06/03/20 0744    Education Details PT/POC, HEP, eval findings    Person(s) Educated Patient    Methods Explanation;Handout    Comprehension Verbalized understanding               PT Long Term Goals - 06/03/20 0747      PT LONG TERM GOAL #1   Title Patient will be I with HEP for low back , hip flexibility and core strength    Baseline unknown    Time 8    Period Weeks    Status New    Target Date 07/29/20      PT LONG TERM GOAL #2   Title Patient will be able to tolerate standing, light housework for 30 min without limitation of back pain most of the time.    Baseline 15 min    Time 8    Period Weeks    Status New    Target Date 07/29/20      PT LONG TERM GOAL #3   Title Patient will show lumbar ROM to [facility] without increased pain.    Baseline limited in flexion, extension and rotation    Time 8    Period Weeks    Status New    Target Date 07/29/20      PT LONG TERM GOAL #4   Title Patient will be able to demo proper lifting, body mechanics for ADLs    Baseline unknown to patient    Time 6    Period Weeks    Status New    Target Date 07/29/20                  Plan - 06/03/20 1106    Clinical Impression Statement Patient presents for low complexity eval of low back pain related to muscle/tissue imbalance after weight loss.  She is inactive , shows weakness in core, hips and limitations in ADLs, functional mobility.  She will benefit to improve comfort at rest and ability to stand, lift and walk.           Patient will benefit from skilled therapeutic intervention in order to improve the following deficits and impairments:     Visit Diagnosis: Bilateral low back pain without sciatica, unspecified chronicity  Muscle weakness (generalized)     Problem List Patient Active Problem List   Diagnosis Date Noted  . Panniculitis 05/12/2020  . Inclusion cyst 05/01/2020  .  Sebaceous cyst 04/21/2020  . Postoperative breast asymmetry 02/28/2020  . Cognitive dysfunction 02/21/2020  . Acute gout involving toe of left foot 01/27/2020  . Hematuria 12/10/2019  . Neuropathy due to chemotherapeutic drug ([place]) 12/10/2019  . Dysphagia 07/23/2019  . CKD stage 3 due to type 2 diabetes mellitus ([place]) 07/12/2019  . Normocytic anemia 07/12/2019  . Preventative health care 09/10/2018  . Genetic testing 07/25/2018  . Port-A-Cath in place 07/12/2018  . Family history of breast cancer   . Family history of prostate  cancer   . Family history of lung cancer   . Malignant neoplasm of lower-inner quadrant of right breast of female, estrogen receptor positive ([place]) 05/10/2018  . Hyperlipidemia 04/26/2017  . Chronic anxiety 04/11/2017  . Upper airway cough syndrome 11/11/2016  . Morbid (severe) obesity due to excess calories ([place]) 10/14/2016  . Allergic rhinitis 02/18/2016  . Non-insulin dependent type 2 diabetes mellitus ([place]) 06/23/2015  . Renal calculi 10/29/2014  . Recurrent boils 06/11/2014  . Hidradenitis suppurativa of right >> left axilla 01/30/2012  . 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 Sarcoidosis 02/08/2007  . Cigarette smoker 02/08/2007  . HIV disease ([place]) 07/24/2006  . Depression 07/24/2006  . Essential hypertension 07/24/2006  . Asthma 07/24/2006  . GERD 07/24/2006    Trong Gosling 06/03/2020, 11:19 AM  [facility] [address] [place], [place], [address] Phone: [phone]   Fax:  [phone]  Name: [name] MRN: [id number] Date of Birth: 1966/01/12   Raeford Razor, PT 06/03/20 11:20 AM Phone: [phone] Fax: [phone]

## 2020-06-09 ENCOUNTER — Telehealth: Payer: Self-pay | Admitting: *Deleted

## 2020-06-09 ENCOUNTER — Other Ambulatory Visit: Payer: 59

## 2020-06-09 ENCOUNTER — Other Ambulatory Visit: Payer: Self-pay

## 2020-06-09 DIAGNOSIS — Z20822 Contact with and (suspected) exposure to covid-19: Secondary | ICD-10-CM

## 2020-06-09 NOTE — Telephone Encounter (Signed)
Patient called to see if she can drop off disability forms for Dr [name] to complete. She states that she does not yet feel ready to return to work after her cancer treatments. She states she is still following up with several different providers for her current problems (neuropathy, kidney disease, diabetes).  She is done with chemotherapy, does not follow up with the oncology group any longer. RN advised patient to reach out to her primary care physician to see if she is able to assist, as patient's very well-controlled HIV is not contributing to her current issues. Landis Gandy, RN

## 2020-06-09 NOTE — Telephone Encounter (Signed)
We need to meet to discuss.  Okay for virtual visit. Try to put at the end of a session or before a block.

## 2020-06-09 NOTE — Telephone Encounter (Addendum)
Patient left a voicemail stating that she needs some help with disability issues. See phone note and reply from [employer] regarding disability paperwork.

## 2020-06-10 LAB — NOVEL CORONAVIRUS, NAA: SARS-CoV-2, NAA: NOT DETECTED

## 2020-06-10 LAB — SARS-COV-2, NAA 2 DAY TAT

## 2020-06-11 NOTE — Telephone Encounter (Signed)
Called patient and scheduled for virtual visit.

## 2020-06-16 ENCOUNTER — Encounter: Payer: Self-pay | Admitting: Primary Care

## 2020-06-16 ENCOUNTER — Other Ambulatory Visit: Payer: Self-pay

## 2020-06-16 ENCOUNTER — Ambulatory Visit: Payer: 59

## 2020-06-16 ENCOUNTER — Telehealth (INDEPENDENT_AMBULATORY_CARE_PROVIDER_SITE_OTHER): Payer: 59 | Admitting: Primary Care

## 2020-06-16 DIAGNOSIS — C50311 Malignant neoplasm of lower-inner quadrant of right female breast: Secondary | ICD-10-CM

## 2020-06-16 DIAGNOSIS — F331 Major depressive disorder, recurrent, moderate: Secondary | ICD-10-CM

## 2020-06-16 DIAGNOSIS — F09 Unspecified mental disorder due to known physiological condition: Secondary | ICD-10-CM | POA: Diagnosis not present

## 2020-06-16 DIAGNOSIS — B20 Human immunodeficiency virus [HIV] disease: Secondary | ICD-10-CM

## 2020-06-16 DIAGNOSIS — F419 Anxiety disorder, unspecified: Secondary | ICD-10-CM | POA: Diagnosis not present

## 2020-06-16 DIAGNOSIS — Z170 Estrogen receptor positive status [ER+]: Secondary | ICD-10-CM

## 2020-06-16 DIAGNOSIS — E890 Postprocedural hypothyroidism: Secondary | ICD-10-CM | POA: Diagnosis not present

## 2020-06-16 NOTE — Assessment & Plan Note (Signed)
Significant medical history with complications over the last several years.  She does have numerous outpatient providers for whom she sees. She does appear anxious about returning to work due to ongoing symptoms from her medical conditions.  I agreed to extend her disability for three months, with a tentative return to work date of September 16, 2020. In the mean time we will get her connected with therapy to help her prepare to return to the work force.   She will also meet with her oncologist next month to discuss further. She will drop off forms later this week.

## 2020-06-16 NOTE — Assessment & Plan Note (Signed)
Referral placed for therapy.

## 2020-06-16 NOTE — Assessment & Plan Note (Signed)
Will be seeing endocrinology in September 2021. Continue current dose of levothyroxine.

## 2020-06-16 NOTE — Progress Notes (Signed)
Subjective:    Patient ID: [name], female    DOB: 11-25-1965, 54 y.o.   MRN: [id number]  HPI  Virtual Visit via Video Note  I connected with [name] on 06/16/20 at 10:40 AM EDT by a video enabled telemedicine application and verified that I am speaking with the correct person using two identifiers.  Location: Patient: Home Provider: Office   I discussed the limitations of evaluation and management by telemedicine and the availability of in person appointments. The patient expressed understanding and agreed to proceed.  History of Present Illness:  [name] is a 54 year old female with a significant medical history including HIV, CKD, post surgical hypothyroidism (thyroid cancer), asthma, chronic gout, breast cancer, tobacco abuse, obesity who presents today to discuss disability forms.   History of breast cancer to the right breast in July 2019, underwent chemotherapy and radiation, lumpectomy in 2019. Following with survivorship program, last visit was in April 2021. She is due to see her oncologist in September 2021, has an appointment for later in the month. Plans on discussing disability with him.   HIV is treated and monitored by infectious disease, Dr. [name], and has had stable levels for quite some time. Last lab testing completed in February 2021, undetectable.   She is seeing endocrinology for her diabetes and will be seeing them for thyroid disease later next month. She is also following with nephrology, cardiology, infectious disease in the outpatient setting.  Today she endorses feeling overwhelmed and is not ready to return to work. She is worried if she can actually keep up during a work day, also has fear about going to a new place of employment.   She suffers with neuropathic pain her upper and lower extremities. She has difficulty with memory and can't remember short term things or instructions. Also with left shoulder pain, has  undergone two cortisone injections per orthopedics, overall some improvement but ongoing pain. Recurrent gout symptoms to the right foot, also feels this same sensation to the left knee. Chronic fatigue, but this has improved. Also with grief from the loss of her brother in law who recently committee suicide. She has a lot of fear of contracting Covid-19 due to cancer history, HIV, and other co-morbidities.   She is currently managed on long term disability through her prior employer, it is time for review and renewal and her infectious disease provider has refused to sign. She's been out of work since August 4th, 2020. She has applied for social security disability which was denied due to not enough supporting documentation from [facility].   She has met with a therapist a few times in the past, would like to get reconnected.    Observations/Objective:  Alert and oriented. Appears well, not sickly. Appears anxious about returning to work.  Speaking in complete sentences. Tearful during visit.   Assessment and Plan:  Significant medical history with complications over the last several years.  She does have numerous outpatient providers for whom she sees. She does appear anxious about returning to work due to ongoing symptoms from her medical conditions.  I agreed to extend her disability for three months, with a tentative return to work date of September 16, 2020. In the mean time we will get her connected with therapy to help her prepare to return to the work force.   She will also meet with her oncologist next month to discuss further. She will drop off forms later this week.  Follow Up Instructions:  Please drop off your disability forms so that I can take a look.  You will be contacted regarding your referral to therapy.  Please let us know if you have not been contacted within two weeks.   It was a pleasure to see you today!    I discussed the assessment and treatment plan with  the patient. The patient was provided an opportunity to ask questions and all were answered. The patient agreed with the plan and demonstrated an understanding of the instructions.   The patient was advised to call back or seek an in-person evaluation if the symptoms worsen or if the condition fails to improve as anticipated.    [name], NP    Review of Systems  Constitutional: Positive for fatigue.  Respiratory: Negative for shortness of breath.   Musculoskeletal: Positive for arthralgias and back pain.  Neurological: Positive for numbness.  Psychiatric/Behavioral: The patient is not nervous/anxious.        Past Medical History:  Diagnosis Date  . Allergy   . Anemia    Normocytic  . Anxiety   . Asthma   . Blood dyscrasia   . Bronchitis 2005  . 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 Diabetes mellitus without complication ([place])   . Family history of breast cancer   . Family history of lung cancer   . Family history of prostate cancer   . Gastroenteritis 07/10/07  . GERD 07/24/2006  . GRAVE'S DISEASE 01/01/2008  . History of hidradenitis suppurativa   . History of kidney stones   . History of thrush   . HIV DISEASE 07/24/2006   dx March 05  . Hyperlipidemia   . HYPERTENSION 07/24/2006  . Hyperthyroidism 08/2006   Grave's Disease -diffuse radiotracer uptake 08/25/06 Thyroid scan-Cold nodule to R lower lobe of thyrorid  . Menometrorrhagia    hx of  . Nephrolithiasis   . 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 Personal history of chemotherapy    2020  . Personal history of radiation therapy    2020  . Pneumonia 2005  . 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 Sarcoidosis 02/08/2007   dx as a teenager in [place] from abnl CXR. Completed 2 yrs Prednisone after lung bx confirmation. No symptoms since then.  . Suppurative hidradenitis   . Thyroid cancer ([place])   . THYROID NODULE, RIGHT  02/08/2007     Social History   Socioeconomic History  . Marital status: Single    Spouse name: Not on file  . Number of children: 1  . Years of education: Not on file  . Highest education level: Not on file  Occupational History  . Occupation: Secondary school teacher  Tobacco Use  . Smoking status: Former Smoker    Packs/day: 0.50    Years: 15.00    Pack years: 7.50    Types: Cigarettes    Start date: 04/12/2017  . Smokeless tobacco: Never Used  . Tobacco comment: quit smoking  may 2018  Vaping Use  . Vaping Use: Never used  Substance and Sexual Activity  . Alcohol use: Yes    Alcohol/week: 0.0 standard drinks    Comment: social  . Drug use: No  . Sexual activity: Not Currently    Birth control/protection: Post-menopausal    Comment: declined condoms  Other Topics Concern  . Not on file  Social History Narrative  . Not on file   Social Determinants of Health  Financial Resource Strain:   . Difficulty of Paying Living Expenses: Not on file  Food Insecurity:   . Worried About Charity fundraiser in the Last Year: Not on file  . Ran Out of Food in the Last Year: Not on file  Transportation Needs:   . Lack of Transportation (Medical): Not on file  . Lack of Transportation (Non-Medical): Not on file  Physical Activity:   . Days of Exercise per Week: Not on file  . Minutes of Exercise per Session: Not on file  Stress:   . Feeling of Stress : Not on file  Social Connections:   . Frequency of Communication with Friends and Family: Not on file  . Frequency of Social Gatherings with Friends and Family: Not on file  . Attends Religious Services: Not on file  . Active Member of Clubs or Organizations: Not on file  . Attends Archivist Meetings: Not on file  . Marital Status: Not on file  Intimate Partner Violence:   . Fear of Current or Ex-Partner: Not on file  . Emotionally Abused: Not on file  . Physically Abused: Not on file  . Sexually Abused: Not on file     Past Surgical History:  Procedure Laterality Date  . BREAST EXCISIONAL BIOPSY Right 04/26/2018   right axilla negative  . BREAST EXCISIONAL BIOPSY Left 04/26/2018   left axilla negative  . BREAST LUMPECTOMY Right 10/03/2018   malignant  . BREAST LUMPECTOMY WITH RADIOACTIVE SEED AND SENTINEL LYMPH NODE BIOPSY Right 10/03/2018   Procedure: RIGHT BREAST LUMPECTOMY WITH RADIOACTIVE SEED AND SENTINEL LYMPH NODE MAPPING;  Surgeon: Erroll Luna, MD;  Location: [facility];  Service: General;  Laterality: Right;  . BREAST SURGERY  1997   Breast Reduction   . CYSTOSCOPY W/ URETERAL STENT REMOVAL  11/09/2012   Procedure: CYSTOSCOPY WITH STENT REMOVAL;  Surgeon: Alexis Frock, MD;  Location: [facility];  Service: Urology;  Laterality: Right;  . CYSTOSCOPY WITH RETROGRADE PYELOGRAM, URETEROSCOPY AND STENT PLACEMENT  11/09/2012   Procedure: CYSTOSCOPY WITH RETROGRADE PYELOGRAM, URETEROSCOPY AND STENT PLACEMENT;  Surgeon: Alexis Frock, MD;  Location: [facility];  Service: Urology;  Laterality: Left;  LEFT URETEROSCOPY, STONE MANIPULATION, left STENT exchange   . CYSTOSCOPY WITH STENT PLACEMENT  10/02/2012   Procedure: CYSTOSCOPY WITH STENT PLACEMENT;  Surgeon: Alexis Frock, MD;  Location: [facility];  Service: Urology;  Laterality: Left;  . DILATION AND CURETTAGE OF UTERUS  Feb 2004   s/p for 1st trimester nonviable pregnancy  . EYE SURGERY     sty under eyelid  . INCISE AND DRAIN ABCESS  Nov 03   s/p I &D for righ inframmary fold hidradenitis  . INCISION AND DRAINAGE PERITONSILLAR ABSCESS  Mar 03  . IR CV LINE INJECTION  06/07/2018  . IR IMAGING GUIDED PORT INSERTION  06/20/2018  . IR REMOVAL TUN ACCESS W/ PORT W/O FL MOD SED  06/20/2018  . IRRIGATION AND DEBRIDEMENT ABSCESS  01/31/2012   Procedure: IRRIGATION AND DEBRIDEMENT ABSCESS;  Surgeon: Shann Medal, MD;  [facility];  Service: General;  Laterality: Right;  right breast and axilla   . NEPHROLITHOTOMY  10/02/2012   Procedure: NEPHROLITHOTOMY  PERCUTANEOUS;  Surgeon: Alexis Frock, MD;  [facility];  Service: Urology;  Laterality: Right;  First Stage Percutaneous Nephrolithotomy with Surgeon Access, Left Ureteral Stent    . NEPHROLITHOTOMY  10/04/2012   Procedure: NEPHROLITHOTOMY PERCUTANEOUS SECOND LOOK;  Surgeon: Alexis Frock, MD;  [facility];  Service: Urology;  Laterality: Right;     . NEPHROLITHOTOMY  10/08/2012   Procedure: NEPHROLITHOTOMY PERCUTANEOUS;  Surgeon: Alexis Frock, MD;  [facility];  Service: Urology;  Laterality: Right;  THIRD STAGE, nephrostomy tube exchange x 2  . NEPHROLITHOTOMY  10/11/2012   Procedure: NEPHROLITHOTOMY PERCUTANEOUS SECOND LOOK;  Surgeon: Alexis Frock, MD;  [facility];  Service: Urology;  Laterality: Right;  RIGHT 4 STAGE PERCUTANOUS NEPHROLITHOTOMY, right URETEROSCOPY WITH HOLMIUM LASER   . PORTACATH PLACEMENT Left 05/17/2018   Procedure: INSERTION PORT-A-CATH;  Surgeon: Coralie Keens, MD;  Location: [facility];  Service: General;  Laterality: Left;  . RADICAL NECK DISSECTION  04/12/2017   limited/notes 04/12/2017  . RADICAL NECK DISSECTION N/A 04/12/2017   Procedure: RADICAL NECK DISSECTION;  Surgeon: Melida Quitter, MD;  Location: [place];  Service: ENT;  Laterality: N/A;  limited neck dissection 2 hours total  . REDUCTION MAMMAPLASTY Bilateral 1998  . RIGHT/LEFT HEART CATH AND CORONARY ANGIOGRAPHY N/A 03/12/2020   Procedure: RIGHT/LEFT HEART CATH AND CORONARY ANGIOGRAPHY;  Surgeon: Jolaine Artist, MD;  Location: [facility];  Service: Cardiovascular;  Laterality: N/A;  . [place]  1994  . THYROIDECTOMY  04/12/2017   completion/notes 04/12/2017  . THYROIDECTOMY N/A 04/12/2017   Procedure: THYROIDECTOMY;  Surgeon: Melida Quitter, MD;  Location: [facility];  Service: ENT;  Laterality: N/A;  Completion Thyroidectomy  . TOTAL THYROIDECTOMY  2010    Family History  Problem Relation Age of Onset  . Hypertension Mother   . Cancer Mother         laryngeal  . Heart disease Mother        stent  . Hypertension Father   . Lung cancer Father 75       hx smoking  . Heart disease Other   . Hypertension Other   . Stroke Other        Grandparent  . Kidney disease Other        Grandparent  . Diabetes Other        FH of Diabetes  . Hypertension Sister   . Cancer Maternal Uncle        Lung CA  . Hypertension Brother   . Hypertension Sister   . Breast cancer Maternal Aunt 65  . Breast cancer Paternal Aunt 32  . Prostate cancer Paternal Uncle   . Breast cancer Maternal Aunt        dx 60+  . Breast cancer Paternal Aunt        dx 20's  . Breast cancer Paternal Aunt        dx 35's  . Prostate cancer Paternal Uncle   . Lung cancer Paternal Uncle   . Breast cancer Cousin 33  . Breast cancer Cousin        dx <50  . Breast cancer Cousin        dx <50  . Breast cancer Cousin        dx <50  . Colon cancer Neg Hx   . Esophageal cancer Neg Hx   . Rectal cancer Neg Hx   . Stomach cancer Neg Hx     Allergies  Allergen Reactions  . Genvoya [Elviteg-Cobic-Emtricit-Tenofaf] Hives  . Lisinopril Cough  . Valsartan Other (See Comments) and Cough    Pt states she tolerates medicine now    Current Outpatient Medications on File Prior to Visit  Medication Sig Dispense Refill  . Blood Glucose Monitoring Suppl (ONETOUCH VERIO) w/Device KIT USE AS INSTRUCTED TO  TEST BLOOD SUGAR UP TO 4 TIMES DAILY E11.9 1 kit 0  . dapagliflozin propanediol (FARXIGA) 10 MG TABS tablet Take 10 mg by mouth daily before breakfast. Start the day after your heart cath 30 tablet 5  . dolutegravir ([name]) 50 MG tablet Take 1 tablet (50 mg total) by mouth daily. 30 tablet 2  . doxycycline (VIBRA-TABS) 100 MG tablet doxycycline hyclate 100 mg tablet    . Dulaglutide (TRULICITY) 6.38 LH/7.3SK SOPN Inject 0.75 mg into the skin once a week. For diabetes. 2 mL 3  . emtricitabine-tenofovir AF (DESCOVY) 200-25 MG tablet Take 1 tablet by mouth daily. 30 tablet 2  .  FLOVENT HFA 110 MCG/ACT inhaler TAKE 1 PUFF BY MOUTH TWICE A DAY (Patient taking differently: Inhale 1 puff into the lungs daily as needed (Shortness of breath). ) 36 Inhaler 1  . fluconazole (DIFLUCAN) 100 MG tablet Take 1 tablet (100 mg total) by mouth daily. 7 tablet 3  . gabapentin (NEURONTIN) 300 MG capsule Take 1 capsule (300 mg total) by mouth 3 (three) times daily. (Patient taking differently: Take 300 mg by mouth 2 (two) times daily. ) 90 capsule 3  . glipiZIDE (GLUCOTROL) 5 MG tablet TAKE 1 TABLET (5 MG TOTAL) BY MOUTH 2 (TWO) TIMES DAILY BEFORE A MEAL. FOR DIABETES. (Patient taking differently: Take 5 mg by mouth daily. ) 180 tablet 1  . glucose blood (ONETOUCH VERIO) test strip USE AS INSTRUCTED TO TEST BLOOD SUGAR DAILY 100 each 2  . irbesartan-hydrochlorothiazide (AVALIDE) 150-12.5 MG tablet TAKE 1 TABLET BY MOUTH EVERY DAY FOR BLOOD PRESSURE 90 tablet 2  . Lancets (ONETOUCH ULTRASOFT) lancets Use as instructed to test blood sugar daily 100 each 5  . levothyroxine (SYNTHROID) 200 MCG tablet Take 1 tablet (200 mcg total) by mouth daily before breakfast. 90 tablet 0  . metoprolol succinate (TOPROL-XL) 50 MG 24 hr tablet TAKE 1 TABLET BY MOUTH EVERY DAY WITH OR IMMEDIATELY FOLLOWING A MEAL 90 tablet 0  . Multiple Vitamins-Minerals (MULTIVITAMIN WITH MINERALS) tablet Take 1 tablet by mouth daily.    . predniSONE (DELTASONE) 20 MG tablet Take 2 tablets daily for three days, then 1 tablet daily for three days for gout flare. 9 tablet 0  . rosuvastatin (CRESTOR) 10 MG tablet TAKE 1 TABLET BY MOUTH EVERY DAY FOR CHOLESTEROL (Patient taking differently: Take 10 mg by mouth daily. FOR CHOLESTEROL) 90 tablet 3  . spironolactone (ALDACTONE) 25 MG tablet TAKE 1 TABLET BY MOUTH EVERY DAY 90 tablet 3  . tamoxifen (NOLVADEX) 20 MG tablet Take 1 tablet (20 mg total) by mouth daily. 90 tablet 1  . [DISCONTINUED] prochlorperazine (COMPAZINE) 10 MG tablet TAKE 1 TABLET BY MOUTH EVERY 6 HOURS AS NEEDED FOR  NAUSEA OR VOMITING 30 tablet 2   Current Facility-Administered Medications on File Prior to Visit  Medication Dose Route Frequency Provider Last Rate Last Admin  . sodium chloride flush (NS) 0.9 % injection 10 mL  10 mL Intracatheter PRN Magrinat, Virgie Dad, MD   10 mL at 09/19/18 1551    Ht _0  (1.651 m)   Wt 237 lb (107.5 kg)   LMP 03/31/2014 Comment: Negative Serum HCG 06/07/17  BMI 39.44 kg/m    Objective:   Physical Exam Pulmonary:     Effort: Pulmonary effort is normal.  Neurological:     Mental Status: She is alert and oriented to person, place, and time.  Psychiatric:        Mood and Affect: Mood normal.  Comments: Tearful during visit             Assessment & Plan:

## 2020-06-17 ENCOUNTER — Ambulatory Visit: Payer: 59 | Admitting: Physical Therapy

## 2020-06-18 ENCOUNTER — Other Ambulatory Visit: Payer: Self-pay | Admitting: Primary Care

## 2020-06-18 DIAGNOSIS — E119 Type 2 diabetes mellitus without complications: Secondary | ICD-10-CM

## 2020-06-19 ENCOUNTER — Other Ambulatory Visit: Payer: Self-pay

## 2020-06-19 ENCOUNTER — Other Ambulatory Visit: Payer: Self-pay | Admitting: Primary Care

## 2020-06-19 ENCOUNTER — Ambulatory Visit: Payer: 59 | Attending: Plastic Surgery | Admitting: Physical Therapy

## 2020-06-19 ENCOUNTER — Encounter: Payer: Self-pay | Admitting: Physical Therapy

## 2020-06-19 DIAGNOSIS — M6281 Muscle weakness (generalized): Secondary | ICD-10-CM | POA: Insufficient documentation

## 2020-06-19 DIAGNOSIS — M545 Low back pain, unspecified: Secondary | ICD-10-CM

## 2020-06-19 DIAGNOSIS — E890 Postprocedural hypothyroidism: Secondary | ICD-10-CM

## 2020-06-19 DIAGNOSIS — K219 Gastro-esophageal reflux disease without esophagitis: Secondary | ICD-10-CM | POA: Insufficient documentation

## 2020-06-19 DIAGNOSIS — Z9221 Personal history of antineoplastic chemotherapy: Secondary | ICD-10-CM | POA: Insufficient documentation

## 2020-06-19 DIAGNOSIS — I10 Essential (primary) hypertension: Secondary | ICD-10-CM | POA: Diagnosis not present

## 2020-06-19 DIAGNOSIS — F419 Anxiety disorder, unspecified: Secondary | ICD-10-CM | POA: Insufficient documentation

## 2020-06-19 DIAGNOSIS — Z923 Personal history of irradiation: Secondary | ICD-10-CM | POA: Insufficient documentation

## 2020-06-19 NOTE — Therapy (Signed)
[place], [place], [address] Phone: [phone]   Fax:  [phone]  Physical Therapy Treatment  Patient Details  Name: [name] MRN: [id number] Date of Birth: 11-20-1965 Referring Provider (PT): Dr. Audelia Hives    Encounter Date: 06/19/2020   PT End of Session - 06/19/20 1158    Visit Number 2    Number of Visits 12    Date for PT Re-Evaluation 07/29/20    Authorization Type [employer]    PT Start Time 1140    PT Stop Time 1225    PT Time Calculation (min) 45 min    Activity Tolerance Patient limited by pain;Patient limited by fatigue    Behavior During Therapy [facility] for tasks assessed/performed           Past Medical History:  Diagnosis Date  . Allergy   . Anemia    Normocytic  . Anxiety   . Asthma   . Blood dyscrasia   . Bronchitis 2005  . 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 Diabetes mellitus without complication ([place])   . Family history of breast cancer   . Family history of lung cancer   . Family history of prostate cancer   . Gastroenteritis 07/10/07  . GERD 07/24/2006  . GRAVE'S DISEASE 01/01/2008  . History of hidradenitis suppurativa   . History of kidney stones   . History of thrush   . HIV DISEASE 07/24/2006   dx March 05  . Hyperlipidemia   . HYPERTENSION 07/24/2006  . Hyperthyroidism 08/2006   Grave's Disease -diffuse radiotracer uptake 08/25/06 Thyroid scan-Cold nodule to R lower lobe of thyrorid  . Menometrorrhagia    hx of  . Nephrolithiasis   . 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 Personal history of chemotherapy    2020  . Personal history of radiation therapy    2020  . Pneumonia 2005  . 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 Sarcoidosis 02/08/2007   dx as a teenager in [place] from abnl CXR. Completed 2 yrs Prednisone after lung bx confirmation. No symptoms since  then.  . Suppurative hidradenitis   . Thyroid cancer ([place])   . THYROID NODULE, RIGHT 02/08/2007    Past Surgical History:  Procedure Laterality Date  . BREAST EXCISIONAL BIOPSY Right 04/26/2018   right axilla negative  . BREAST EXCISIONAL BIOPSY Left 04/26/2018   left axilla negative  . BREAST LUMPECTOMY Right 10/03/2018   malignant  . BREAST LUMPECTOMY WITH RADIOACTIVE SEED AND SENTINEL LYMPH NODE BIOPSY Right 10/03/2018   Procedure: RIGHT BREAST LUMPECTOMY WITH RADIOACTIVE SEED AND SENTINEL LYMPH NODE MAPPING;  Surgeon: Erroll Luna, MD;  Location: [place];  Service: General;  Laterality: Right;  . BREAST SURGERY  1997   Breast Reduction   . CYSTOSCOPY W/ URETERAL STENT REMOVAL  11/09/2012   Procedure: CYSTOSCOPY WITH STENT REMOVAL;  Surgeon: Alexis Frock, MD;  [facility];  Service: Urology;  Laterality: Right;  . CYSTOSCOPY WITH RETROGRADE PYELOGRAM, URETEROSCOPY AND STENT PLACEMENT  11/09/2012   Procedure: CYSTOSCOPY WITH RETROGRADE PYELOGRAM, URETEROSCOPY AND STENT PLACEMENT;  Surgeon: Alexis Frock, MD;  [facility];  Service: Urology;  Laterality: Left;  LEFT URETEROSCOPY, STONE MANIPULATION, left STENT exchange   . CYSTOSCOPY WITH STENT PLACEMENT  10/02/2012   Procedure: CYSTOSCOPY WITH STENT PLACEMENT;  Surgeon: Alexis Frock, MD;  [facility];  Service: Urology;  Laterality: Left;  . DILATION AND CURETTAGE OF UTERUS  Feb  2004   s/p for 1st trimester nonviable pregnancy  . EYE SURGERY     sty under eyelid  . INCISE AND DRAIN ABCESS  Nov 03   s/p I &D for righ inframmary fold hidradenitis  . INCISION AND DRAINAGE PERITONSILLAR ABSCESS  Mar 03  . IR CV LINE INJECTION  06/07/2018  . IR IMAGING GUIDED PORT INSERTION  06/20/2018  . IR REMOVAL TUN ACCESS W/ PORT W/O FL MOD SED  06/20/2018  . IRRIGATION AND DEBRIDEMENT ABSCESS  01/31/2012   Procedure: IRRIGATION AND DEBRIDEMENT ABSCESS;  Surgeon: Shann Medal, MD;  [facility];  Service: General;  Laterality:  Right;  right breast and axilla   . NEPHROLITHOTOMY  10/02/2012   Procedure: NEPHROLITHOTOMY PERCUTANEOUS;  Surgeon: Alexis Frock, MD;  [facility];  Service: Urology;  Laterality: Right;  First Stage Percutaneous Nephrolithotomy with Surgeon Access, Left Ureteral Stent    . NEPHROLITHOTOMY  10/04/2012   Procedure: NEPHROLITHOTOMY PERCUTANEOUS SECOND LOOK;  Surgeon: Alexis Frock, MD;  [facility];  Service: Urology;  Laterality: Right;     . NEPHROLITHOTOMY  10/08/2012   Procedure: NEPHROLITHOTOMY PERCUTANEOUS;  Surgeon: Alexis Frock, MD;  [facility];  Service: Urology;  Laterality: Right;  THIRD STAGE, nephrostomy tube exchange x 2  . NEPHROLITHOTOMY  10/11/2012   Procedure: NEPHROLITHOTOMY PERCUTANEOUS SECOND LOOK;  Surgeon: Alexis Frock, MD;  [facility];  Service: Urology;  Laterality: Right;  RIGHT 4 STAGE PERCUTANOUS NEPHROLITHOTOMY, right URETEROSCOPY WITH HOLMIUM LASER   . PORTACATH PLACEMENT Left 05/17/2018   Procedure: INSERTION PORT-A-CATH;  Surgeon: Coralie Keens, MD;  Location: [facility];  Service: General;  Laterality: Left;  . RADICAL NECK DISSECTION  04/12/2017   limited/notes 04/12/2017  . RADICAL NECK DISSECTION N/A 04/12/2017   Procedure: RADICAL NECK DISSECTION;  Surgeon: Melida Quitter, MD;  Location: [facility];  Service: ENT;  Laterality: N/A;  limited neck dissection 2 hours total  . REDUCTION MAMMAPLASTY Bilateral 1998  . RIGHT/LEFT HEART CATH AND CORONARY ANGIOGRAPHY N/A 03/12/2020   Procedure: RIGHT/LEFT HEART CATH AND CORONARY ANGIOGRAPHY;  Surgeon: Jolaine Artist, MD;  Location: [facility];  Service: Cardiovascular;  Laterality: N/A;  . [place]  1994  . THYROIDECTOMY  04/12/2017   completion/notes 04/12/2017  . THYROIDECTOMY N/A 04/12/2017   Procedure: THYROIDECTOMY;  Surgeon: Melida Quitter, MD;  Location: [facility];  Service: ENT;  Laterality: N/A;  Completion Thyroidectomy  . TOTAL THYROIDECTOMY  2010    There were  no vitals filed for this visit.   Subjective Assessment - 06/19/20 1148    Subjective Pt has been out of town.  She lost her brother to suicide.  She has been highly stressed.    Currently in Pain? Yes    Pain Score 5     Pain Location Back    Pain Orientation Right;Left;Lower    Pain Descriptors / Indicators Tightness    Pain Type Chronic pain    Pain Onset More than a month ago    Pain Frequency Intermittent    Aggravating Factors  activity , standing    Pain Relieving Factors rest                  [facility] Adult PT Treatment/Exercise - 06/19/20 0001      Lumbar Exercises: Stretches   Active Hamstring Stretch Right;Left;2 reps;30 seconds    Single Knee to Chest Stretch 2 reps;30 seconds    Lower Trunk Rotation 10 seconds    Lower  Trunk Rotation Limitations x 10     Pelvic Tilt 10 reps    Pelvic Tilt Limitations cues     Lumbar Stabilization Level 1 Limitations clam , march and heel slide x 10    Other Lumbar Stretch Exercise sidelying Rt trunk stretch with brief overpressure to Rt mid lumbar       Lumbar Exercises: Aerobic   Nustep l4-L7 for 5 min UE and LE                   PT Education - 06/19/20 1807    Education Details basic stabiity exercises, RPE    Person(s) Educated Patient    Methods Explanation;Demonstration    Comprehension Verbalized understanding;Need further instruction               PT Long Term Goals - 06/03/20 0747      PT LONG TERM GOAL #1   Title Patient will be I with HEP for low back , hip flexibility and core strength    Baseline unknown    Time 8    Period Weeks    Status New    Target Date 07/29/20      PT LONG TERM GOAL #2   Title Patient will be able to tolerate standing, light housework for 30 min without limitation of back pain most of the time.    Baseline 15 min    Time 8    Period Weeks    Status New    Target Date 07/29/20      PT LONG TERM GOAL #3   Title Patient will show lumbar ROM to [facility] without  increased pain.    Baseline limited in flexion, extension and rotation    Time 8    Period Weeks    Status New    Target Date 07/29/20      PT LONG TERM GOAL #4   Title Patient will be able to demo proper lifting, body mechanics for ADLs    Baseline unknown to patient    Time 6    Period Weeks    Status New    Target Date 07/29/20                 Plan - 06/19/20 1158    Clinical Impression Statement Patient was able to exercise on the mat with no increase in pain .  She is mentally stressed due to current events but did well.  We discussed  plans to begin an exercise program now that she has more energy.    Personal Factors and Comorbidities Comorbidity 3+;Past/Current Experience    Comorbidities cancer, chronic pain, obesity , L shoulder    Examination-Activity Limitations Bathing;Bed Mobility;Bend;Caring for Others;Stand;Locomotion Level;Squat;Lift;Transfers    Examination-Participation Restrictions Laundry;Cleaning;Community Activity;Driving;Shop;Meal Prep    Stability/Clinical Decision Making Stable/Uncomplicated    Clinical Decision Making Low    Rehab Potential Excellent    PT Frequency 2x / week    PT Duration 8 weeks    PT Treatment/Interventions ADLs/Self Care Home Management;Cryotherapy;Ultrasound;Balance training;Neuromuscular re-education;Functional mobility training;Moist Heat;Therapeutic activities;Therapeutic exercise;Patient/family education;Manual techniques;Passive range of motion           Patient will benefit from skilled therapeutic intervention in order to improve the following deficits and impairments:  Decreased endurance, Decreased mobility, Hypomobility, Decreased range of motion, Decreased activity tolerance, Decreased strength, Increased fascial restricitons, Impaired flexibility, Postural dysfunction, Pain, Obesity, Improper body mechanics, Difficulty walking  Visit Diagnosis: Bilateral low back pain without sciatica, unspecified  chronicity  Muscle weakness (generalized)     Problem List Patient Active Problem List   Diagnosis Date Noted  . Impingement syndrome of left shoulder region 05/27/2020  . Panniculitis 05/12/2020  . Inclusion cyst 05/01/2020  . Sebaceous cyst 04/21/2020  . Postoperative breast asymmetry 02/28/2020  . Cognitive dysfunction 02/21/2020  . Acute gout involving toe of left foot 01/27/2020  . Hematuria 12/10/2019  . Neuropathy due to chemotherapeutic drug ([place]) 12/10/2019  . Bursitis of left shoulder 09/06/2019  . Pain in joint of left shoulder 09/05/2019  . Dysphagia 07/23/2019  . CKD stage 3 due to type 2 diabetes mellitus ([place]) 07/12/2019  . Normocytic anemia 07/12/2019  . Preventative health care 09/10/2018  . Genetic testing 07/25/2018  . Port-A-Cath in place 07/12/2018  . Family history of breast cancer   . Family history of prostate cancer   . Family history of lung cancer   . Malignant neoplasm of lower-inner quadrant of right breast of female, estrogen receptor positive ([place]) 05/10/2018  . Hyperlipidemia 04/26/2017  . Chronic anxiety 04/11/2017  . Mass of left side of neck 11/24/2016  . Laryngopharyngeal reflux (LPR) 11/24/2016  . Upper airway cough syndrome 11/11/2016  . Morbid (severe) obesity due to excess calories ([place]) 10/14/2016  . Allergic rhinitis 02/18/2016  . Non-insulin dependent type 2 diabetes mellitus ([place]) 06/23/2015  . Renal calculi 10/29/2014  . Recurrent boils 06/11/2014  . Hidradenitis suppurativa of right >> left axilla 01/30/2012  . 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 Sarcoidosis 02/08/2007  . Cigarette smoker 02/08/2007  . HIV disease ([place]) 07/24/2006  . Depression 07/24/2006  . Essential hypertension 07/24/2006  . Asthma 07/24/2006  . GERD 07/24/2006    Manny Vitolo 06/19/2020, 6:11 PM  [facility] [address] [place], [place], [address] Phone: [phone]   Fax:   [phone]  Name: [name] MRN: [id number] Date of Birth: 10-27-1965  Raeford Razor, PT 06/19/20 6:11 PM Phone: [phone] Fax: [phone]

## 2020-06-20 ENCOUNTER — Other Ambulatory Visit: Payer: Self-pay | Admitting: Internal Medicine

## 2020-06-20 DIAGNOSIS — B20 Human immunodeficiency virus [HIV] disease: Secondary | ICD-10-CM

## 2020-06-23 ENCOUNTER — Ambulatory Visit: Payer: 59 | Admitting: Physical Therapy

## 2020-06-25 ENCOUNTER — Telehealth: Payer: Self-pay

## 2020-06-25 ENCOUNTER — Other Ambulatory Visit: Payer: Self-pay

## 2020-06-25 ENCOUNTER — Ambulatory Visit: Payer: 59 | Admitting: Physical Therapy

## 2020-06-25 DIAGNOSIS — M6281 Muscle weakness (generalized): Secondary | ICD-10-CM

## 2020-06-25 DIAGNOSIS — M545 Low back pain, unspecified: Secondary | ICD-10-CM

## 2020-06-25 NOTE — Therapy (Signed)
[place], [place], [address] Phone: [phone]   Fax:  [phone]  Physical Therapy Treatment  Patient Details  Name: Mackenzey Crownover MRN: [id number] Date of Birth: [birth year] Referring Provider (PT): Dr. Audelia Hives    Encounter Date: 06/25/2020   PT End of Session - 06/25/20 1109    Visit Number 3    Number of Visits 12    Date for PT Re-Evaluation 07/29/20    Authorization Type [employer]    PT Start Time 1054    PT Stop Time 1132    PT Time Calculation (min) 38 min    Activity Tolerance Patient limited by pain    Behavior During Therapy [facility] for tasks assessed/performed           Past Medical History:  Diagnosis Date  . Allergy   . Anemia    Normocytic  . Anxiety   . Asthma   . Blood dyscrasia   . Bronchitis 2005  . 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 Diabetes mellitus without complication ([place])   . Family history of breast cancer   . Family history of lung cancer   . Family history of prostate cancer   . Gastroenteritis 07/10/07  . GERD 07/24/2006  . GRAVE'S DISEASE 01/01/2008  . History of hidradenitis suppurativa   . History of kidney stones   . History of thrush   . HIV DISEASE 07/24/2006   dx March 05  . Hyperlipidemia   . HYPERTENSION 07/24/2006  . Hyperthyroidism 08/2006   Grave's Disease -diffuse radiotracer uptake 08/25/06 Thyroid scan-Cold nodule to R lower lobe of thyrorid  . Menometrorrhagia    hx of  . Nephrolithiasis   . 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 Personal history of chemotherapy    2020  . Personal history of radiation therapy    2020  . Pneumonia 2005  . 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 Sarcoidosis 02/08/2007   dx as a teenager in [place] from abnl CXR. Completed 2 yrs Prednisone after lung bx confirmation. No symptoms since then.  . Suppurative  hidradenitis   . Thyroid cancer ([place])   . THYROID NODULE, RIGHT 02/08/2007    Past Surgical History:  Procedure Laterality Date  . BREAST EXCISIONAL BIOPSY Right 04/26/2018   right axilla negative  . BREAST EXCISIONAL BIOPSY Left 04/26/2018   left axilla negative  . BREAST LUMPECTOMY Right 10/03/2018   malignant  . BREAST LUMPECTOMY WITH RADIOACTIVE SEED AND SENTINEL LYMPH NODE BIOPSY Right 10/03/2018   Procedure: RIGHT BREAST LUMPECTOMY WITH RADIOACTIVE SEED AND SENTINEL LYMPH NODE MAPPING;  Surgeon: Erroll Luna, MD;  Location: [facility];  Service: General;  Laterality: Right;  . BREAST SURGERY  1997   Breast Reduction   . CYSTOSCOPY W/ URETERAL STENT REMOVAL  11/09/2012   Procedure: CYSTOSCOPY WITH STENT REMOVAL;  Surgeon: Alexis Frock, MD;  [facility];  Service: Urology;  Laterality: Right;  . CYSTOSCOPY WITH RETROGRADE PYELOGRAM, URETEROSCOPY AND STENT PLACEMENT  11/09/2012   Procedure: CYSTOSCOPY WITH RETROGRADE PYELOGRAM, URETEROSCOPY AND STENT PLACEMENT;  Surgeon: Alexis Frock, MD;  [facility];  Service: Urology;  Laterality: Left;  LEFT URETEROSCOPY, STONE MANIPULATION, left STENT exchange   . CYSTOSCOPY WITH STENT PLACEMENT  10/02/2012   Procedure: CYSTOSCOPY WITH STENT PLACEMENT;  Surgeon: Alexis Frock, MD;  [facility];  Service: Urology;  Laterality: Left;  . DILATION AND CURETTAGE OF UTERUS  Feb 2004  s/p for 1st trimester nonviable pregnancy  . EYE SURGERY     sty under eyelid  . INCISE AND DRAIN ABCESS  Nov 03   s/p I &D for righ inframmary fold hidradenitis  . INCISION AND DRAINAGE PERITONSILLAR ABSCESS  Mar 03  . IR CV LINE INJECTION  06/07/2018  . IR IMAGING GUIDED PORT INSERTION  06/20/2018  . IR REMOVAL TUN ACCESS W/ PORT W/O FL MOD SED  06/20/2018  . IRRIGATION AND DEBRIDEMENT ABSCESS  01/31/2012   Procedure: IRRIGATION AND DEBRIDEMENT ABSCESS;  Surgeon: Shann Medal, MD;  [facility];  Service: General;  Laterality: Right;  right breast  and axilla   . NEPHROLITHOTOMY  10/02/2012   Procedure: NEPHROLITHOTOMY PERCUTANEOUS;  Surgeon: Alexis Frock, MD;  [facility];  Service: Urology;  Laterality: Right;  First Stage Percutaneous Nephrolithotomy with Surgeon Access, Left Ureteral Stent    . NEPHROLITHOTOMY  10/04/2012   Procedure: NEPHROLITHOTOMY PERCUTANEOUS SECOND LOOK;  Surgeon: Alexis Frock, MD;  [facility];  Service: Urology;  Laterality: Right;     . NEPHROLITHOTOMY  10/08/2012   Procedure: NEPHROLITHOTOMY PERCUTANEOUS;  Surgeon: Alexis Frock, MD;  [facility];  Service: Urology;  Laterality: Right;  THIRD STAGE, nephrostomy tube exchange x 2  . NEPHROLITHOTOMY  10/11/2012   Procedure: NEPHROLITHOTOMY PERCUTANEOUS SECOND LOOK;  Surgeon: Alexis Frock, MD;  [facility];  Service: Urology;  Laterality: Right;  RIGHT 4 STAGE PERCUTANOUS NEPHROLITHOTOMY, right URETEROSCOPY WITH HOLMIUM LASER   . PORTACATH PLACEMENT Left 05/17/2018   Procedure: INSERTION PORT-A-CATH;  Surgeon: Coralie Keens, MD;  Location: [facility];  Service: General;  Laterality: Left;  . RADICAL NECK DISSECTION  04/12/2017   limited/notes 04/12/2017  . RADICAL NECK DISSECTION N/A 04/12/2017   Procedure: RADICAL NECK DISSECTION;  Surgeon: Melida Quitter, MD;  Location: [place];  Service: ENT;  Laterality: N/A;  limited neck dissection 2 hours total  . REDUCTION MAMMAPLASTY Bilateral 1998  . RIGHT/LEFT HEART CATH AND CORONARY ANGIOGRAPHY N/A 03/12/2020   Procedure: RIGHT/LEFT HEART CATH AND CORONARY ANGIOGRAPHY;  Surgeon: Jolaine Artist, MD;  Location: [facility] [place];  Service: Cardiovascular;  Laterality: N/A;  . [place]  1994  . THYROIDECTOMY  04/12/2017   completion/notes 04/12/2017  . THYROIDECTOMY N/A 04/12/2017   Procedure: THYROIDECTOMY;  Surgeon: Melida Quitter, MD;  Location: [facility];  Service: ENT;  Laterality: N/A;  Completion Thyroidectomy  . TOTAL THYROIDECTOMY  2010    There were no vitals filed for  this visit.   Subjective Assessment - 06/25/20 1105    Subjective Has another funeral today.  I didnt want to cancel.  Back is OK, 5/10. Has some pain in Rt glute.    Currently in Pain? Yes    Pain Score 5     Pain Location Hip    Pain Orientation Right    Pain Descriptors / Indicators Tightness    Pain Type Chronic pain    Pain Onset More than a month ago    Pain Frequency Intermittent              [facility] Adult PT Treatment/Exercise - 06/25/20 0001      Lumbar Exercises: Stretches   Lower Trunk Rotation 10 seconds    Lower Trunk Rotation Limitations x 10     Pelvic Tilt 10 reps    Pelvic Tilt Limitations cues     Lumbar Stabilization Level 1 Limitations heel slide x 10     Figure 4 Stretch Limitations  seated x 3 Rt hip post manual       Lumbar Exercises: Aerobic   Nustep 6 min L 5 UE and LE       Lumbar Exercises: Prone   Straight Leg Raise 10 reps    Other Prone Lumbar Exercises TrA in prone x 10 , added knee flexion x 10 (for stabilization)       Lumbar Exercises: Quadruped   Other Quadruped Lumbar Exercises childs pose added lateral bias to Rt side       Manual Therapy   Manual Therapy Soft tissue mobilization    Soft tissue mobilization Rt glute medius , maximus and piriformis in sidelying with IASTM                   PT Education - 06/25/20 1230    Education Details HEP    Person(s) Educated Patient    Methods Explanation;Demonstration    Comprehension Verbalized understanding;Returned demonstration               PT Long Term Goals - 06/03/20 0747      PT LONG TERM GOAL #1   Title Patient will be I with HEP for low back , hip flexibility and core strength    Baseline unknown    Time 8    Period Weeks    Status New    Target Date 07/29/20      PT LONG TERM GOAL #2   Title Patient will be able to tolerate standing, light housework for 30 min without limitation of back pain most of the time.    Baseline 15 min    Time 8    Period Weeks     Status New    Target Date 07/29/20      PT LONG TERM GOAL #3   Title Patient will show lumbar ROM to [facility] without increased pain.    Baseline limited in flexion, extension and rotation    Time 8    Period Weeks    Status New    Target Date 07/29/20      PT LONG TERM GOAL #4   Title Patient will be able to demo proper lifting, body mechanics for ADLs    Baseline unknown to patient    Time 6    Period Weeks    Status New    Target Date 07/29/20                 Plan - 06/25/20 1231    Clinical Impression Statement Patient doing OK with respect to her back, has some Rt sided glute pain which is a fairly new symptom. She is thinking she has just been more sore from HEP. No pain after session.  Goals in progress.    PT Treatment/Interventions ADLs/Self Care Home Management;Cryotherapy;Ultrasound;Balance training;Neuromuscular re-education;Functional mobility training;Moist Heat;Therapeutic activities;Therapeutic exercise;Patient/family education;Manual techniques;Passive range of motion    PT Next Visit Plan core, begin more endurance, standing    PT Home Exercise Plan [id number]- see pt instructions    Consulted and Agree with Plan of Care Patient           Patient will benefit from skilled therapeutic intervention in order to improve the following deficits and impairments:  Decreased endurance, Decreased mobility, Hypomobility, Decreased range of motion, Decreased activity tolerance, Decreased strength, Increased fascial restricitons, Impaired flexibility, Postural dysfunction, Pain, Obesity, Improper body mechanics, Difficulty walking  Visit Diagnosis: Bilateral low back pain without sciatica, unspecified chronicity  Muscle weakness (generalized)  Problem List Patient Active Problem List   Diagnosis Date Noted  . Impingement syndrome of left shoulder region 05/27/2020  . Panniculitis 05/12/2020  . Inclusion cyst 05/01/2020  . Sebaceous cyst 04/21/2020  .  Postoperative breast asymmetry 02/28/2020  . Cognitive dysfunction 02/21/2020  . Acute gout involving toe of left foot 01/27/2020  . Hematuria 12/10/2019  . Neuropathy due to chemotherapeutic drug ([place]) 12/10/2019  . Bursitis of left shoulder 09/06/2019  . Pain in joint of left shoulder 09/05/2019  . Dysphagia 07/23/2019  . CKD stage 3 due to type 2 diabetes mellitus ([place]) 07/12/2019  . Normocytic anemia 07/12/2019  . Preventative health care 09/10/2018  . Genetic testing 07/25/2018  . Port-A-Cath in place 07/12/2018  . Family history of breast cancer   . Family history of prostate cancer   . Family history of lung cancer   . Malignant neoplasm of lower-inner quadrant of right breast of female, estrogen receptor positive ([place]) 05/10/2018  . Hyperlipidemia 04/26/2017  . Chronic anxiety 04/11/2017  . Mass of left side of neck 11/24/2016  . Laryngopharyngeal reflux (LPR) 11/24/2016  . Upper airway cough syndrome 11/11/2016  . Morbid (severe) obesity due to excess calories ([place]) 10/14/2016  . Allergic rhinitis 02/18/2016  . Non-insulin dependent type 2 diabetes mellitus ([place]) 06/23/2015  . Renal calculi 10/29/2014  . Recurrent boils 06/11/2014  . Hidradenitis suppurativa of right >> left axilla 01/30/2012  . 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 Sarcoidosis 02/08/2007  . Cigarette smoker 02/08/2007  . HIV disease ([place]) 07/24/2006  . Depression 07/24/2006  . Essential hypertension 07/24/2006  . Asthma 07/24/2006  . GERD 07/24/2006    Candance Bohlman 06/25/2020, 12:43 PM  [facility] [address] [place], [place], [address] Phone: [phone]   Fax:  [phone]  Name: Lachlyn Vanderstelt MRN: [id number] Date of Birth: Feb 21, 1966  Raeford Razor, PT 06/25/20 12:43 PM Phone: [phone] Fax: [phone]

## 2020-06-25 NOTE — Telephone Encounter (Signed)
Patient left disability forms in the front. Forms are on [employer] cart.

## 2020-06-25 NOTE — Patient Instructions (Signed)
Access Code: [email or web address]: [email or web address]: 09/09/2021Prepared by: Anderson Malta PaaExercises  Supine Posterior Pelvic Tilt - 2 x daily - 7 x weekly - 2 sets - 10 reps - 10 hold  Supine Lower Trunk Rotation - 2 x daily - 7 x weekly - 2 sets - 10 reps - 10 hold  Supine Piriformis Stretch with Leg Straight - 2 x daily - 7 x weekly - 1 sets - 5 reps - 30 hold  Child's Pose Stretch - 1 x daily - 7 x weekly - 1 sets - 10 reps - 30 hold  Child's Pose with Sidebending - 1 x daily - 7 x weekly - 1 sets - 10 reps - 30 hold

## 2020-06-26 LAB — BASIC METABOLIC PANEL
BUN: 25 — AB (ref 4–21)
CO2: 29 — AB (ref 13–22)
Chloride: 103 (ref 99–108)
Creatinine: 1.6 — AB (ref 0.5–1.1)
Glucose: 192
Potassium: 4 (ref 3.4–5.3)
Sodium: 143 (ref 137–147)

## 2020-06-26 LAB — COMPREHENSIVE METABOLIC PANEL
Albumin: 3.7 (ref 3.5–5.0)
GFR calc Af Amer: 36
GFR calc non Af Amer: 42

## 2020-06-26 LAB — CBC AND DIFFERENTIAL
HCT: 41 (ref 36–46)
Hemoglobin: 13.3 (ref 12.0–16.0)
Neutrophils Absolute: 5
Platelets: 243 (ref 150–399)
WBC: 7.5

## 2020-06-26 LAB — VITAMIN D 25 HYDROXY (VIT D DEFICIENCY, FRACTURES): Vit D, 25-Hydroxy: 20.6

## 2020-06-26 LAB — CBC: RBC: 4.17 (ref 3.87–5.11)

## 2020-06-26 LAB — IRON,TIBC AND FERRITIN PANEL
%SAT: 15
Ferritin: 141
TIBC: 352
UIBC: 300

## 2020-06-29 ENCOUNTER — Telehealth: Payer: Self-pay | Admitting: *Deleted

## 2020-06-29 ENCOUNTER — Ambulatory Visit: Payer: Self-pay | Admitting: Surgery

## 2020-06-29 NOTE — H&P (View-Only) (Signed)
[name] Appointment: 06/29/2020 1:30 PM Location: [facility] Patient #: [id number] DOB: 04/23/1966 Single / Language: English / Race: Black or African American Female  History of Present Illness ([name] A. [name] MD; 06/29/2020 2:20 PM) Patient words: [name] is a pleasant 53 y.o. female with Stage IB right breast invasive ductal carcinoma, ER+/PR+/HER2-, diagnosed in 04/2018, treated with neoadjuvant chemotherapy, lumpectomy, adjuvant radiation therapy, maintenance Kadcyla and anti-estrogen therapy with Tamoxifen beginning in 02/2019. She [facility] for our initial meeting and routine follow-up post-completion of treatment for breast cancer.   She returns for follow-up and to get her port removed. Her brother recently died of COVID recently. She is quite distraught but doing okay. She's had no flareups of her hidradenitis. She is seeing plastic surgery as well due to radiation and cosmetic issues with the breast secondary to treatment. She is anxious to have her port removed.                  Stage IB right breast cancer: [name] is continuing to recover from definitive treatment for breast cancer. She will follow-up with her medical oncologist, Dr. Magrinat in 3 months with history and physical exam per surveillance protocol. She will continue her anti-estrogen therapy with Tamoxifen. Thus far, she is tolerating the Tamoxifen well, with minimal side effects. Her mammogram is due 05/2020; orders placed today.  The patient is a 54 year old female.   Allergies (Jacqueline Haggett, RMA; 06/29/2020 1:44 PM) Tape 1"X5yd *MEDICAL DEVICES AND SUPPLIES* clear tape Genvoya *ANTIVIRALS* Lisinopril *ANTIHYPERTENSIVES* Xylocaine (Cardiac) *ANTIARRHYTHMICS* Valsartan *ANTIHYPERTENSIVES* Allergies Reconciled  Medication History (Jacqueline Haggett, RMA; 06/29/2020 1:45 PM) Neurontin (300MG Capsule, 1 (one) Oral three times daily,  as needed, Taken starting 10/22/2018) Active. Atorvastatin Calcium (20MG Tablet, Oral) Active. HydrALAZINE HCl (25MG Tablet, Oral) Active. Levothyroxine Sodium (175MCG Tablet, Oral) Active. Levothyroxine Sodium (200MCG Tablet, Oral) Active. Losartan Potassium-HCTZ (100-25MG Tablet, Oral) Active. MetFORMIN HCl (500MG Tablet, Oral) Active. Metoprolol Succinate ER (50MG Tablet ER 24HR, Oral) Active. Mometasone Furoate (50MCG/ACT Suspension, Nasal) Active. Odefsey (200-25-25MG Tablet, Oral) Active. OneTouch Delica Lancets 33G Active. OneTouch Verio (In Vitro) Active. ProAir RespiClick (108 (90 Base)MCG/ACT Aero Pow Br Act, Inhalation) Active. Tamoxifen Citrate (20MG Tablet, Oral) Active. Doxycycline Hyclate (Oral) Specific strength unknown - Active. Medications Reconciled    Vitals (Jacqueline Haggett RMA; 06/29/2020 1:46 PM) 06/29/2020 1:45 PM Weight: 238.6 lb Height: 65in Body Surface Area: 2.13 m Body Mass Index: 39.7 kg/m  Temp.: 98.2F(Temporal)  Pulse: 99 (Regular)  P.OX: 99% (Room air) BP: 180/92(Sitting, Right Wrist, Standard) Pt stated her BP was high today. She recently lost her brother to COVID.       Physical Exam (Laramie Gelles A. Jameison Haji MD; 06/29/2020 2:20 PM)  General Mental Status-Alert. General Appearance-Consistent with stated age. Hydration-Well hydrated. Voice-Normal.  Breast Note: Right breast has postradiation surgical changes. Scarring from hidradenitis that there is no active disease. Left breast shows no mass lesion. There is no evidence of hidradenitis noted on the left. Port is in the left upper chest region.  Neurologic Neurologic evaluation reveals -alert and oriented x 3 with no impairment of recent or remote memory. Mental Status-Normal.  Lymphatic Axillary  General Axillary Region: Bilateral - Description - Normal. Tenderness - Non Tender.    Assessment & Plan (Jesilyn Easom A. Dorse Locy MD; 06/29/2020 2:23  PM)  BREAST CANCER, RIGHT (C50.911) Impression: port removal Risk of bleeding, infection, catheter migration, need for further treatments and her procedure discussed with patient.  Current Plans   Pt Education - [facility] Free Text Education/Instructions: discussed with patient and provided information. You are being scheduled for surgery- Our schedulers will call you.  You should hear from our office's scheduling department within 5 working days about the location, date, and time of surgery. We try to make accommodations for patient's preferences in scheduling surgery, but sometimes the OR schedule or the surgeon's schedule prevents us from making those accommodations.  If you have not heard from our office ([phone]) in 5 working days, call the office and ask for your surgeon's nurse.  If you have other questions about your diagnosis, plan, or surgery, call the office and ask for your surgeon's nurse.  I recommended surgery to remove the catheter. I explained the technique of removal with use of local anesthesia & possible need for more aggressive sedation/anesthesia for patient comfort.  Risks such as bleeding, infection, and other risks were discussed. Post-operative dressing/incision care was discussed. I noted a good likelihood this will help address the problem. We will work to minimize complications. Questions were answered. The patient expresses understanding & wishes to proceed with surgery.   PORT-A-CATH IN PLACE (Z95.828) 

## 2020-06-29 NOTE — Telephone Encounter (Signed)
Patient called stating that she just found out that several of her nieces and nephews have been exposed to covid by their cousin. Patient stated that the kids spent the weekend with their cousin and one of them slept in the same with the cousin that has covid. Patient stated that she was not around their cousin but she spent yesterday with her nieces and nephews and was hugging on them. Patient stated that she is freaking out because of her health issues. Patient stated that her sister is taking the kids to be tested for covid . Patient stated that her sister was told to have thet kids tested within the next 3-5 days. Patient stated that her sister is taking her kids out of school until she gets them tested and the results back. Patient was advised that it would be a good idea for her to be tested since she is concerned about her exposure. Patient was given the information for [facility] for her to call and go ahead and schedule an appointment for testing when she needs it done. Patient was given ER precautions and she verbalized understanding. Patient has had her covid vaccines.

## 2020-06-29 NOTE — Telephone Encounter (Signed)
Noted and agree. 

## 2020-06-29 NOTE — H&P (Signed)
[name] Appointment: 06/29/2020 1:30 PM Location: [facility] Patient #: [id number] DOB: 1966/08/22 Single / Language: [name] / Race: Black or African American Female  History of Present Illness Felicia Moores A. Laiken Nohr MD; 06/29/2020 2:20 PM) Patient words: Ms.. [name] is a pleasant 54 y.o. female with Stage IB right breast invasive ductal carcinoma, ER+/PR+/HER2-, diagnosed in 04/2018, treated with neoadjuvant chemotherapy, lumpectomy, adjuvant radiation therapy, maintenance Kadcyla and anti-estrogen therapy with Tamoxifen beginning in 02/2019. She presents to the [facility]orship [facility] for our initial meeting and routine follow-up post-completion of treatment for breast cancer.   She returns for follow-up and to get her port removed. Her brother recently died of COVID recently. She is quite distraught but doing okay. She's had no flareups of her hidradenitis. She is seeing plastic surgery as well due to radiation and cosmetic issues with the breast secondary to treatment. She is anxious to have her port removed.                  Stage IB right breast cancer: [name] is continuing to recover from definitive treatment for breast cancer. She will follow-up with her medical oncologist, Dr. Jana Tate in 3 months with history and physical exam per surveillance protocol. She will continue her anti-estrogen therapy with Tamoxifen. Thus far, she is tolerating the Tamoxifen well, with minimal side effects. Her mammogram is due 05/2020; orders placed today.  The patient is a 54 year old female.   Allergies ([name], RMA; 06/29/2020 1:44 PM) Tape 1"X5yd *MEDICAL DEVICES AND SUPPLIES* clear tape Genvoya *ANTIVIRALS* Lisinopril *ANTIHYPERTENSIVES* Xylocaine (Cardiac) *ANTIARRHYTHMICS* Valsartan *ANTIHYPERTENSIVES* Allergies Reconciled  Medication History [employer], RMA; 06/29/2020 1:45 PM) Neurontin (300MG Capsule, 1 (one) Oral three times daily,  as needed, Taken starting 10/22/2018) Active. Atorvastatin Calcium (20MG Tablet, Oral) Active. HydrALAZINE HCl (25MG Tablet, Oral) Active. Levothyroxine Sodium (175MCG Tablet, Oral) Active. Levothyroxine Sodium (200MCG Tablet, Oral) Active. Losartan Potassium-HCTZ (100-25MG Tablet, Oral) Active. MetFORMIN HCl (500MG Tablet, Oral) Active. Metoprolol Succinate ER (50MG Tablet ER 24HR, Oral) Active. Mometasone Furoate (50MCG/ACT Suspension, Nasal) Active. Odefsey (200-25-25MG Tablet, Oral) Active. OneTouch Delica Lancets 92J Active. OneTouch Verio (In Vitro) Active. ProAir RespiClick (194 (90 Base)MCG/ACT Aero Pow Br Act, Inhalation) Active. Tamoxifen Citrate (20MG Tablet, Oral) Active. Doxycycline Hyclate (Oral) Specific strength unknown - Active. Medications Reconciled    Vitals ([name] RMA; 06/29/2020 1:46 PM) 06/29/2020 1:45 PM Weight: 238.6 lb Height: 65in Body Surface Area: 2.13 m Body Mass Index: 39.7 kg/m  Temp.: 98.21F(Temporal)  Pulse: 99 (Regular)  P.OX: 99% (Room air) BP: 180/92(Sitting, Right Wrist, Standard) Pt stated her BP was high today. She recently lost her brother to [name].       Physical Exam (Felicia Ellsworth A. Toshi Ishii MD; 06/29/2020 2:20 PM)  General Mental Status-Alert. General Appearance-Consistent with stated age. Hydration-Well hydrated. Voice-Normal.  Breast Note: Right breast has postradiation surgical changes. Scarring from hidradenitis that there is no active disease. Left breast shows no mass lesion. There is no evidence of hidradenitis noted on the left. Port is in the left upper chest region.  Neurologic Neurologic evaluation reveals -alert and oriented x 3 with no impairment of recent or remote memory. Mental Status-Normal.  Lymphatic Axillary  General Axillary Region: Bilateral - Description - Normal. Tenderness - Non Tender.    Assessment & Plan (Felicia Ryther A. Marsi Turvey MD; 06/29/2020 2:23  PM)  BREAST CANCER, RIGHT (C50.911) Impression: port removal Risk of bleeding, infection, catheter migration, need for further treatments and her procedure discussed with patient.  Current Plans  Pt Education - [facility]CS Free Text Education/Instructions: discussed with patient and provided information. You are being scheduled for surgery- Our schedulers will call you.  You should hear from our office's scheduling department within 5 working days about the location, date, and time of surgery. We try to make accommodations for patient's preferences in scheduling surgery, but sometimes the OR schedule or the surgeon's schedule prevents [place] from making those accommodations.  If you have not heard from our office [phone]) in 5 working days, call the office and ask for your surgeon's nurse.  If you have other questions about your diagnosis, plan, or surgery, call the office and ask for your surgeon's nurse.  I recommended surgery to remove the catheter. I explained the technique of removal with use of local anesthesia & possible need for more aggressive sedation/anesthesia for patient comfort.  Risks such as bleeding, infection, and other risks were discussed. Post-operative dressing/incision care was discussed. I noted a good likelihood this will help address the problem. We will work to minimize complications. Questions were answered. The patient expresses understanding & wishes to proceed with surgery.   PORT-A-CATH IN PLACE [phone])

## 2020-06-30 ENCOUNTER — Ambulatory Visit: Payer: 59 | Admitting: Physical Therapy

## 2020-06-30 ENCOUNTER — Telehealth: Payer: Self-pay | Admitting: Primary Care

## 2020-06-30 ENCOUNTER — Encounter: Payer: Self-pay | Admitting: Physical Therapy

## 2020-06-30 ENCOUNTER — Other Ambulatory Visit: Payer: Self-pay

## 2020-06-30 DIAGNOSIS — M6281 Muscle weakness (generalized): Secondary | ICD-10-CM

## 2020-06-30 DIAGNOSIS — M545 Low back pain, unspecified: Secondary | ICD-10-CM

## 2020-06-30 NOTE — Telephone Encounter (Signed)
Patient is requesting a handicap placard form completed b/c her's has expired.  Please advise.

## 2020-06-30 NOTE — Therapy (Signed)
[place], [place], [address] Phone: [phone]   Fax:  [phone]  Physical Therapy Treatment  Patient Details  Name: [name] MRN: [id number] Date of Birth: 1966/05/11 Referring Provider (PT): Dr. Audelia Hives    Encounter Date: 06/30/2020   PT End of Session - 06/30/20 1227    Visit Number 4    Number of Visits 12    Date for PT Re-Evaluation 07/29/20    Authorization Type [employer]    PT Start Time 1220    PT Stop Time 1258    PT Time Calculation (min) 38 min    Activity Tolerance Patient limited by fatigue;Patient tolerated treatment well    Behavior During Therapy [facility] for tasks assessed/performed           Past Medical History:  Diagnosis Date  . Allergy   . Anemia    Normocytic  . Anxiety   . Asthma   . Blood dyscrasia   . Bronchitis 2005  . 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 Diabetes mellitus without complication ([place])   . Family history of breast cancer   . Family history of lung cancer   . Family history of prostate cancer   . Gastroenteritis 07/10/07  . GERD 07/24/2006  . GRAVE'S DISEASE 01/01/2008  . History of hidradenitis suppurativa   . History of kidney stones   . History of thrush   . HIV DISEASE 07/24/2006   dx March 05  . Hyperlipidemia   . HYPERTENSION 07/24/2006  . Hyperthyroidism 08/2006   Grave's Disease -diffuse radiotracer uptake 08/25/06 Thyroid scan-Cold nodule to R lower lobe of thyrorid  . Menometrorrhagia    hx of  . Nephrolithiasis   . 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 Personal history of chemotherapy    2020  . Personal history of radiation therapy    2020  . Pneumonia 2005  . 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 Sarcoidosis 02/08/2007   dx as a teenager in [place] from abnl CXR. Completed 2 yrs Prednisone after lung bx confirmation. No symptoms  since then.  . Suppurative hidradenitis   . Thyroid cancer ([place])   . THYROID NODULE, RIGHT 02/08/2007    Past Surgical History:  Procedure Laterality Date  . BREAST EXCISIONAL BIOPSY Right 04/26/2018   right axilla negative  . BREAST EXCISIONAL BIOPSY Left 04/26/2018   left axilla negative  . BREAST LUMPECTOMY Right 10/03/2018   malignant  . BREAST LUMPECTOMY WITH RADIOACTIVE SEED AND SENTINEL LYMPH NODE BIOPSY Right 10/03/2018   Procedure: RIGHT BREAST LUMPECTOMY WITH RADIOACTIVE SEED AND SENTINEL LYMPH NODE MAPPING;  Surgeon: Erroll Luna, MD;  Location: [facility];  Service: General;  Laterality: Right;  . BREAST SURGERY  1997   Breast Reduction   . CYSTOSCOPY W/ URETERAL STENT REMOVAL  11/09/2012   Procedure: CYSTOSCOPY WITH STENT REMOVAL;  Surgeon: Alexis Frock, MD;  Location: [facility];  Service: Urology;  Laterality: Right;  . CYSTOSCOPY WITH RETROGRADE PYELOGRAM, URETEROSCOPY AND STENT PLACEMENT  11/09/2012   Procedure: CYSTOSCOPY WITH RETROGRADE PYELOGRAM, URETEROSCOPY AND STENT PLACEMENT;  Surgeon: Alexis Frock, MD;  Location: [facility];  Service: Urology;  Laterality: Left;  LEFT URETEROSCOPY, STONE MANIPULATION, left STENT exchange   . CYSTOSCOPY WITH STENT PLACEMENT  10/02/2012   Procedure: CYSTOSCOPY WITH STENT PLACEMENT;  Surgeon: Alexis Frock, MD;  Location: [facility];  Service: Urology;  Laterality: Left;  . DILATION AND CURETTAGE OF UTERUS  Feb  2004   s/p for 1st trimester nonviable pregnancy  . EYE SURGERY     sty under eyelid  . INCISE AND DRAIN ABCESS  Nov 03   s/p I &D for righ inframmary fold hidradenitis  . INCISION AND DRAINAGE PERITONSILLAR ABSCESS  Mar 03  . IR CV LINE INJECTION  06/07/2018  . IR IMAGING GUIDED PORT INSERTION  06/20/2018  . IR REMOVAL TUN ACCESS W/ PORT W/O FL MOD SED  06/20/2018  . IRRIGATION AND DEBRIDEMENT ABSCESS  01/31/2012   Procedure: IRRIGATION AND DEBRIDEMENT ABSCESS;  Surgeon: Shann Medal, MD;  Location: [facility];  Service: General;   Laterality: Right;  right breast and axilla   . NEPHROLITHOTOMY  10/02/2012   Procedure: NEPHROLITHOTOMY PERCUTANEOUS;  Surgeon: Alexis Frock, MD;  Location: [facility];  Service: Urology;  Laterality: Right;  First Stage Percutaneous Nephrolithotomy with Surgeon Access, Left Ureteral Stent    . NEPHROLITHOTOMY  10/04/2012   Procedure: NEPHROLITHOTOMY PERCUTANEOUS SECOND LOOK;  Surgeon: Alexis Frock, MD;  Location: [facility];  Service: Urology;  Laterality: Right;     . NEPHROLITHOTOMY  10/08/2012   Procedure: NEPHROLITHOTOMY PERCUTANEOUS;  Surgeon: Alexis Frock, MD;  Location[facility];  Service: Urology;  Laterality: Right;  THIRD STAGE, nephrostomy tube exchange x 2  . NEPHROLITHOTOMY  10/11/2012   Procedure: NEPHROLITHOTOMY PERCUTANEOUS SECOND LOOK;  Surgeon: Alexis Frock, MD;  Location[facility];  Service: Urology;  Laterality: Right;  RIGHT 4 STAGE PERCUTANOUS NEPHROLITHOTOMY, right URETEROSCOPY WITH HOLMIUM LASER   . PORTACATH PLACEMENT Left 05/17/2018   Procedure: INSERTION PORT-A-CATH;  Surgeon: Coralie Keens, MD;  Location: [facility];  Service: General;  Laterality: Left;  . RADICAL NECK DISSECTION  04/12/2017   limited/notes 04/12/2017  . RADICAL NECK DISSECTION N/A 04/12/2017   Procedure: RADICAL NECK DISSECTION;  Surgeon: Melida Quitter, MD;  Location: [facility];  Service: ENT;  Laterality: N/A;  limited neck dissection 2 hours total  . REDUCTION MAMMAPLASTY Bilateral 1998  . RIGHT/LEFT HEART CATH AND CORONARY ANGIOGRAPHY N/A 03/12/2020   Procedure: RIGHT/LEFT HEART CATH AND CORONARY ANGIOGRAPHY;  Surgeon: Jolaine Artist, MD;  Location: [facility];  Service: Cardiovascular;  Laterality: N/A;  . [place]  1994  . THYROIDECTOMY  04/12/2017   completion/notes 04/12/2017  . THYROIDECTOMY N/A 04/12/2017   Procedure: THYROIDECTOMY;  Surgeon: Melida Quitter, MD;  Location: [facility];  Service: ENT;  Laterality: N/A;  Completion Thyroidectomy  . TOTAL THYROIDECTOMY  2010     There were no vitals filed for this visit.   Subjective Assessment - 06/30/20 1227    Subjective Back is tight, not really having pain right now. The glute is better.    Currently in Pain? No/denies            [facility] Adult PT Treatment/Exercise - 06/30/20 0001      Lumbar Exercises: Stretches   Single Knee to Chest Stretch 2 reps;30 seconds    Single Knee to Chest Stretch Limitations with cross over     Lower Trunk Rotation 10 seconds    Lower Trunk Rotation Limitations x 10     Hip Flexor Stretch Right;Left;1 rep;60 seconds    Hip Flexor Stretch Limitations tranverse foam roller     Lumbar Stabilization Level 1 Limitations march with slight pelvic tilt     Figure 4 Stretch 2 reps;30 seconds      Lumbar Exercises: Aerobic   Nustep 6 min L 5 UE and LE       Lumbar  Exercises: Standing   Heel Raises 10 reps    Heel Raises Limitations core focus     Functional Squats 10 reps   cued for form    Functional Squats Limitations core, 2 sets added 8 lbs     Lifting 10 reps    Lifting Weights (lbs) deadlift , hinge , 8 lbs      Other Standing Lumbar Exercises isometric core press on physio ball x 10       Lumbar Exercises: Supine   Pelvic Tilt 10 reps    Pelvic Tilt Limitations over ball     Bridge 10 reps                 PT Long Term Goals - 06/30/20 1228      PT LONG TERM GOAL #1   Title Patient will be I with HEP for low back , hip flexibility and core strength    Status On-going      PT LONG TERM GOAL #2   Title Patient will be able to tolerate standing, light housework for 30 min without limitation of back pain most of the time.    Status On-going      PT LONG TERM GOAL #3   Title Patient will show lumbar ROM to [facility] without increased pain.    Status On-going      PT LONG TERM GOAL #4   Title Patient will be able to demo proper lifting, body mechanics for ADLs    Status On-going                 Plan - 06/30/20 1238    Clinical Impression  Statement Patient was given time to practice lifting, squatting mechanics after stretching on the mat. She has min increase in back pain but mostly just fatigue.  She is just beginning her progress in PT as she has not been mentally present due to family stressors.  Asked to make more appts today.    PT Treatment/Interventions ADLs/Self Care Home Management;Cryotherapy;Ultrasound;Balance training;Neuromuscular re-education;Functional mobility training;Moist Heat;Therapeutic activities;Therapeutic exercise;Patient/family education;Manual techniques;Passive range of motion    PT Next Visit Plan core, begin more endurance, standing, lifting etc.    PT Home Exercise Plan [id number]- see pt instructions    Consulted and Agree with Plan of Care Patient           Patient will benefit from skilled therapeutic intervention in order to improve the following deficits and impairments:  Decreased endurance, Decreased mobility, Hypomobility, Decreased range of motion, Decreased activity tolerance, Decreased strength, Increased fascial restricitons, Impaired flexibility, Postural dysfunction, Pain, Obesity, Improper body mechanics, Difficulty walking  Visit Diagnosis: Bilateral low back pain without sciatica, unspecified chronicity  Muscle weakness (generalized)     Problem List Patient Active Problem List   Diagnosis Date Noted  . Impingement syndrome of left shoulder region 05/27/2020  . Panniculitis 05/12/2020  . Inclusion cyst 05/01/2020  . Sebaceous cyst 04/21/2020  . Postoperative breast asymmetry 02/28/2020  . Cognitive dysfunction 02/21/2020  . Acute gout involving toe of left foot 01/27/2020  . Hematuria 12/10/2019  . Neuropathy due to chemotherapeutic drug ([place]) 12/10/2019  . Bursitis of left shoulder 09/06/2019  . Pain in joint of left shoulder 09/05/2019  . Dysphagia 07/23/2019  . CKD stage 3 due to type 2 diabetes mellitus ([place]) 07/12/2019  . Normocytic anemia 07/12/2019  .  Preventative health care 09/10/2018  . Genetic testing 07/25/2018  . Port-A-Cath in place 07/12/2018  . Family history of breast  cancer   . Family history of prostate cancer   . Family history of lung cancer   . Malignant neoplasm of lower-inner quadrant of right breast of female, estrogen receptor positive ([place]) 05/10/2018  . Hyperlipidemia 04/26/2017  . Chronic anxiety 04/11/2017  . Mass of left side of neck 11/24/2016  . Laryngopharyngeal reflux (LPR) 11/24/2016  . Upper airway cough syndrome 11/11/2016  . Morbid (severe) obesity due to excess calories ([place]) 10/14/2016  . Allergic rhinitis 02/18/2016  . Non-insulin dependent type 2 diabetes mellitus ([place]) 06/23/2015  . Renal calculi 10/29/2014  . Recurrent boils 06/11/2014  . Hidradenitis suppurativa of right >> left axilla 01/30/2012  . 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 Sarcoidosis 02/08/2007  . Cigarette smoker 02/08/2007  . HIV disease ([place]) 07/24/2006  . Depression 07/24/2006  . Essential hypertension 07/24/2006  . Asthma 07/24/2006  . GERD 07/24/2006    Abdalla Naramore 06/30/2020, 1:03 PM  [facility] [address] [place], [place], [address] Phone: [phone]   Fax:  [phone]  Name: Kenadi Miltner MRN: [id number] Date of Birth: 1966-01-05  Raeford Razor, PT 06/30/20 1:04 PM Phone: [phone] Fax: [phone]

## 2020-07-01 ENCOUNTER — Ambulatory Visit: Payer: 59 | Admitting: Physical Therapy

## 2020-07-01 ENCOUNTER — Encounter: Payer: Self-pay | Admitting: Physical Therapy

## 2020-07-01 DIAGNOSIS — M545 Low back pain, unspecified: Secondary | ICD-10-CM

## 2020-07-01 DIAGNOSIS — M6281 Muscle weakness (generalized): Secondary | ICD-10-CM

## 2020-07-01 NOTE — Therapy (Addendum)
[place], [place], [address] Phone: [phone]   Fax:  [phone]  Physical Therapy Treatment/Discharge  Patient Details  Name: [name] MRN: [id number] Date of Birth: Sep 03, 1966 Referring Provider (PT): Dr. Audelia Hives    Encounter Date: 07/01/2020   PT End of Session - 07/01/20 0853    Visit Number 5    Number of Visits 12    Date for PT Re-Evaluation 07/29/20    Authorization Type [employer]    PT Start Time 0849    PT Stop Time 0927    PT Time Calculation (min) 38 min           Past Medical History:  Diagnosis Date  . Allergy   . Anemia    Normocytic  . Anxiety   . Asthma   . Blood dyscrasia   . Bronchitis 2005  . 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 Diabetes mellitus without complication ([place])   . Family history of breast cancer   . Family history of lung cancer   . Family history of prostate cancer   . Gastroenteritis 07/10/07  . GERD 07/24/2006  . GRAVE'S DISEASE 01/01/2008  . History of hidradenitis suppurativa   . History of kidney stones   . History of thrush   . HIV DISEASE 07/24/2006   dx March 05  . Hyperlipidemia   . HYPERTENSION 07/24/2006  . Hyperthyroidism 08/2006   Grave's Disease -diffuse radiotracer uptake 08/25/06 Thyroid scan-Cold nodule to R lower lobe of thyrorid  . Menometrorrhagia    hx of  . Nephrolithiasis   . 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 Personal history of chemotherapy    2020  . Personal history of radiation therapy    2020  . Pneumonia 2005  . 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 Sarcoidosis 02/08/2007   dx as a teenager in [place] from abnl CXR. Completed 2 yrs Prednisone after lung bx confirmation. No symptoms since then.  . Suppurative hidradenitis   . Thyroid cancer ([place])   . THYROID NODULE, RIGHT 02/08/2007    Past Surgical History:    Procedure Laterality Date  . BREAST EXCISIONAL BIOPSY Right 04/26/2018   right axilla negative  . BREAST EXCISIONAL BIOPSY Left 04/26/2018   left axilla negative  . BREAST LUMPECTOMY Right 10/03/2018   malignant  . BREAST LUMPECTOMY WITH RADIOACTIVE SEED AND SENTINEL LYMPH NODE BIOPSY Right 10/03/2018   Procedure: RIGHT BREAST LUMPECTOMY WITH RADIOACTIVE SEED AND SENTINEL LYMPH NODE MAPPING;  Surgeon: Erroll Luna, MD;  Location: [facility];  Service: General;  Laterality: Right;  . BREAST SURGERY  1997   Breast Reduction   . CYSTOSCOPY W/ URETERAL STENT REMOVAL  11/09/2012   Procedure: CYSTOSCOPY WITH STENT REMOVAL;  Surgeon: Alexis Frock, MD;  Location: [facility];  Service: Urology;  Laterality: Right;  . CYSTOSCOPY WITH RETROGRADE PYELOGRAM, URETEROSCOPY AND STENT PLACEMENT  11/09/2012   Procedure: CYSTOSCOPY WITH RETROGRADE PYELOGRAM, URETEROSCOPY AND STENT PLACEMENT;  Surgeon: Alexis Frock, MD;  Location: [facility];  Service: Urology;  Laterality: Left;  LEFT URETEROSCOPY, STONE MANIPULATION, left STENT exchange   . CYSTOSCOPY WITH STENT PLACEMENT  10/02/2012   Procedure: CYSTOSCOPY WITH STENT PLACEMENT;  Surgeon: Alexis Frock, MD;  Location: [facility];  Service: Urology;  Laterality: Left;  . DILATION AND CURETTAGE OF UTERUS  Feb 2004   s/p for 1st trimester nonviable pregnancy  . EYE SURGERY     sty under eyelid  .  INCISE AND DRAIN ABCESS  Nov 03   s/p I &D for righ inframmary fold hidradenitis  . INCISION AND DRAINAGE PERITONSILLAR ABSCESS  Mar 03  . IR CV LINE INJECTION  06/07/2018  . IR IMAGING GUIDED PORT INSERTION  06/20/2018  . IR REMOVAL TUN ACCESS W/ PORT W/O FL MOD SED  06/20/2018  . IRRIGATION AND DEBRIDEMENT ABSCESS  01/31/2012   Procedure: IRRIGATION AND DEBRIDEMENT ABSCESS;  Surgeon: Kandis Cocking, MD;  Location: [facility];  Service: General;  Laterality: Right;  right breast and axilla   . NEPHROLITHOTOMY  10/02/2012   Procedure: NEPHROLITHOTOMY PERCUTANEOUS;  Surgeon: Sebastian Ache, MD;  Location: [facility];  Service: Urology;  Laterality: Right;  First Stage Percutaneous Nephrolithotomy with Surgeon Access, Left Ureteral Stent    . NEPHROLITHOTOMY  10/04/2012   Procedure: NEPHROLITHOTOMY PERCUTANEOUS SECOND LOOK;  Surgeon: Sebastian Ache, MD;  Location: [facility];  Service: Urology;  Laterality: Right;     . NEPHROLITHOTOMY  10/08/2012   Procedure: NEPHROLITHOTOMY PERCUTANEOUS;  Surgeon: Sebastian Ache, MD;  Location: [facility];  Service: Urology;  Laterality: Right;  THIRD STAGE, nephrostomy tube exchange x 2  . NEPHROLITHOTOMY  10/11/2012   Procedure: NEPHROLITHOTOMY PERCUTANEOUS SECOND LOOK;  Surgeon: Sebastian Ache, MD;  Location: [facility];  Service: Urology;  Laterality: Right;  RIGHT 4 STAGE PERCUTANOUS NEPHROLITHOTOMY, right URETEROSCOPY WITH HOLMIUM LASER   . PORTACATH PLACEMENT Left 05/17/2018   Procedure: INSERTION PORT-A-CATH;  Surgeon: Abigail Miyamoto, MD;  Location: [facility];  Service: General;  Laterality: Left;  . RADICAL NECK DISSECTION  04/12/2017   limited/notes 04/12/2017  . RADICAL NECK DISSECTION N/A 04/12/2017   Procedure: RADICAL NECK DISSECTION;  Surgeon: Christia Reading, MD;  Location: [facility];  Service: ENT;  Laterality: N/A;  limited neck dissection 2 hours total  . REDUCTION MAMMAPLASTY Bilateral 1998  . RIGHT/LEFT HEART CATH AND CORONARY ANGIOGRAPHY N/A 03/12/2020   Procedure: RIGHT/LEFT HEART CATH AND CORONARY ANGIOGRAPHY;  Surgeon: Dolores Patty, MD;  Location: [facility];  Service: Cardiovascular;  Laterality: N/A;  . [place]  1994  . THYROIDECTOMY  04/12/2017   completion/notes 04/12/2017  . THYROIDECTOMY N/A 04/12/2017   Procedure: THYROIDECTOMY;  Surgeon: Christia Reading, MD;  Location: [facility] OR;  Service: ENT;  Laterality: N/A;  Completion Thyroidectomy  . TOTAL THYROIDECTOMY  2010    There were no vitals filed for this visit.   Subjective Assessment - 07/01/20 0852    Subjective Not much pain today, I worked it out  yesterday.    Currently in Pain? Yes    Pain Score 6     Pain Location Back    Pain Orientation Lower    Pain Descriptors / Indicators Aching    Pain Type Chronic pain    Aggravating Factors  activity, standing    Pain Relieving Factors rest                             [facility] Adult PT Treatment/Exercise - 07/01/20 0001      Lumbar Exercises: Stretches   Active Hamstring Stretch Right;Left;2 reps;30 seconds    Active Hamstring Stretch Limitations supine     Single Knee to Chest Stretch 2 reps;30 seconds    Single Knee to Chest Stretch Limitations with cross over     Lumbar Stabilization Level 1 Limitations march with slight pelvic tilt     Figure 4 Stretch 2 reps;30 seconds    Gastroc Stretch  2 reps;20 seconds    Gastroc Stretch Limitations slant board       Lumbar Exercises: Aerobic   Nustep 6 min L 5 UE and LE       Lumbar Exercises: Standing   Heel Raises 10 reps    Functional Squats 10 reps   cued for form    Functional Squats Limitations 1 set , 8 lbs     Lifting 10 reps    Lifting Weights (lbs) deadlift , hinge , 8 lbs      Row 20 reps    Theraband Level (Row) Level 3 (Green)    Shoulder Extension 20 reps    Theraband Level (Shoulder Extension) Level 3 (Green)      Lumbar Exercises: Supine   Pelvic Tilt 10 reps    Bent Knee Raise 20 reps    Bent Knee Raise Limitations with small PPT     Bridge 10 reps                       PT Long Term Goals - 06/30/20 1228      PT LONG TERM GOAL #1   Title Patient will be I with HEP for low back , hip flexibility and core strength    Status On-going      PT LONG TERM GOAL #2   Title Patient will be able to tolerate standing, light housework for 30 min without limitation of back pain most of the time.    Status On-going      PT LONG TERM GOAL #3   Title Patient will show lumbar ROM to [facility] without increased pain.    Status On-going      PT LONG TERM GOAL #4   Title Patient will be able to  demo proper lifting, body mechanics for ADLs    Status On-going                 Plan - 07/01/20 0920    Clinical Impression Statement Pt reports soreness from yesterday's session. Continued with core, stretching and closed chain core /LE strength. Added standing scap bands with good tolerance.    PT Next Visit Plan core, begin more endurance, standing, lifting etc.    PT Home Exercise Plan [id number]- see pt instructions           Patient will benefit from skilled therapeutic intervention in order to improve the following deficits and impairments:  Decreased endurance, Decreased mobility, Hypomobility, Decreased range of motion, Decreased activity tolerance, Decreased strength, Increased fascial restricitons, Impaired flexibility, Postural dysfunction, Pain, Obesity, Improper body mechanics, Difficulty walking  Visit Diagnosis: Bilateral low back pain without sciatica, unspecified chronicity  Muscle weakness (generalized)     Problem List Patient Active Problem List   Diagnosis Date Noted  . Impingement syndrome of left shoulder region 05/27/2020  . Panniculitis 05/12/2020  . Inclusion cyst 05/01/2020  . Sebaceous cyst 04/21/2020  . Postoperative breast asymmetry 02/28/2020  . Cognitive dysfunction 02/21/2020  . Acute gout involving toe of left foot 01/27/2020  . Hematuria 12/10/2019  . Neuropathy due to chemotherapeutic drug ([place]) 12/10/2019  . Bursitis of left shoulder 09/06/2019  . Pain in joint of left shoulder 09/05/2019  . Dysphagia 07/23/2019  . CKD stage 3 due to type 2 diabetes mellitus ([place]) 07/12/2019  . Normocytic anemia 07/12/2019  . Preventative health care 09/10/2018  . Genetic testing 07/25/2018  . Port-A-Cath in place 07/12/2018  . Family history of breast cancer   .  Family history of prostate cancer   . Family history of lung cancer   . Malignant neoplasm of lower-inner quadrant of right breast of female, estrogen receptor positive ([place])  05/10/2018  . Hyperlipidemia 04/26/2017  . Chronic anxiety 04/11/2017  . Mass of left side of neck 11/24/2016  . Laryngopharyngeal reflux (LPR) 11/24/2016  . Upper airway cough syndrome 11/11/2016  . Morbid (severe) obesity due to excess calories ([place]) 10/14/2016  . Allergic rhinitis 02/18/2016  . Non-insulin dependent type 2 diabetes mellitus ([place]) 06/23/2015  . Renal calculi 10/29/2014  . Recurrent boils 06/11/2014  . Hidradenitis suppurativa of right >> left axilla 01/30/2012  . 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 Sarcoidosis 02/08/2007  . Cigarette smoker 02/08/2007  . HIV disease ([place]) 07/24/2006  . Depression 07/24/2006  . Essential hypertension 07/24/2006  . Asthma 07/24/2006  . GERD 07/24/2006    Dorene Ar, PTA 07/01/2020, 9:27 AM  [facility] [address] [place], [place], [address] Phone: [phone]   Fax:  [phone]  Name: Hadas Jessop MRN: [id number] Date of Birth: [birth year]   PHYSICAL THERAPY DISCHARGE SUMMARY  Visits from Start of Care: 5  Current functional level related to goals / functional outcomes: See above    Remaining deficits: See above    Education / Equipment: Hip hinging, posture, lifting , HEP  Plan: Patient agrees to discharge.  Patient goals were partially met. Patient is being discharged due to the patient's request.  ?????     Raeford Razor, PT 07/20/20 4:03 PM Phone: [phone] Fax: [phone]

## 2020-07-02 ENCOUNTER — Ambulatory Visit: Payer: 59 | Admitting: Physical Therapy

## 2020-07-02 NOTE — Telephone Encounter (Signed)
She was given by cancer center. Collie Siad to sob with walking. If ok to give I have form to fill out just let me know if you want temporary or permanent.

## 2020-07-02 NOTE — Telephone Encounter (Signed)
[name], will you inquire as to why she is requesting a handicap placard?

## 2020-07-02 NOTE — Telephone Encounter (Signed)
Okay to provide temporary 90-month placard.

## 2020-07-02 NOTE — Telephone Encounter (Signed)
Form filled out left message on patients voicemail to know ready for pick up at reception.

## 2020-07-06 ENCOUNTER — Other Ambulatory Visit: Payer: Self-pay

## 2020-07-06 ENCOUNTER — Ambulatory Visit
Admission: RE | Admit: 2020-07-06 | Discharge: 2020-07-06 | Disposition: A | Discharge status: Home / Self Care | Payer: 59 | Source: Ambulatory Visit | Attending: Adult Health | Admitting: Adult Health

## 2020-07-06 DIAGNOSIS — C50311 Malignant neoplasm of lower-inner quadrant of right female breast: Secondary | ICD-10-CM

## 2020-07-06 NOTE — Progress Notes (Signed)
Cardio-oncology Clinic Note    Date:  07/06/2020   ID:  Jaliya, Siegmann 1966/08/03, MRN [id number]  Location: Home  Provider location: [facility] Type of Visit: Established patient  PCP:  [name], NP  Cardiologist:  No primary care provider on file. Primary HF: Secret Kristensen  Chief Complaint: Heart Failure follow-up   History of Present Illness:  [name] is a  54 y.o. female with h/o DM, Graves disease, thyroid CA, HIV, HTN and right breast cancer referred by Dr. Doris Cheadle for enrollment into the Cardio-Oncology program for EF monitoring during chemotherapy  Right breast CA -  upper inner quadrant biopsy 04/26/2018 for a clinical T2 N0, stage Ib invasive ductal carcinoma, grade 2, with an MIB-1 of 70%.   (1) neoadjuvant chemotherapy with carboplatin, docetaxel, trastuzumab and Pertuzumab given every 21 days x 6, starting 05/31/2018 (a) Pertuzumab discontinued starting with cycle 2 because of diarrhea problems  (3) trastuzumab to continue to total 6 months (a) echo on 05/16/2018 shows an EF of 60-65%. (b) echo on 08/27/18 shows an EF 55-60%             (c) echo on 12/03/18 EF 60-65% GLS -18.8  (d) echo on 03/06/19 EF 55% (read as 40-45%) GLS 15.7% - underestimated  (4) s/p lumpectomy 12/19  (5) adjuvant radiation completed 5/20  (6) antiestrogens to start at the completion of local treatment   We saw her in September 2020 for increased SOB. Felt it was volume overload. ReDs was 39%. BP up. Started on spiro and Farxiga but creatinine bumped. Arlyce Harman stopped and the [place] stopped. Wilder Glade supposed to be restarted but insurance would pay for it.  She finished Herceptin on 11/04/19. Underwent R/L cath in 5/21 with normal filling pressures and CO. Mild PH   Studies:   Echo 02/20/20: EF 55-60% Grade I DD. RV ok Personally reviewed  Underwent R/L cath in 5/21 for severe dyspnea  Ao = 175/101  (131) LV = 175/11 RA = 6 RV = 40/11 PA = 41/12 (26) PCW = 10 Fick cardiac output/index = 5.1/2.4 PVR = 3.2 WU Ao sat = 96% PA sat = 64%, 65%  Assessment: 1. Normal coronary arteries 2. LVEF 60-65% 3. Mild PAH likely due to OSA/OHS 4. Otherwise normal hemodynamics 4. Severe systemic HTN  CXR 05/24/19: No acute disease    Past Medical History:  Diagnosis Date   Allergy    Anemia    Normocytic   Anxiety    Asthma    Blood dyscrasia    Bronchitis 2005   CLASS 1-EXOPHTHALMOS-THYROTOXIC 02/08/2007   Diabetes mellitus without complication ([place])    Family history of breast cancer    Family history of lung cancer    Family history of prostate cancer    Gastroenteritis 07/10/07   GERD 07/24/2006   GRAVE'S DISEASE 01/01/2008   History of hidradenitis suppurativa    History of kidney stones    History of thrush    HIV DISEASE 07/24/2006   dx March 05   Hyperlipidemia    HYPERTENSION 07/24/2006   Hyperthyroidism 08/2006   Grave's Disease -diffuse radiotracer uptake 08/25/06 Thyroid scan-Cold nodule to R lower lobe of thyrorid   Menometrorrhagia    hx of   Nephrolithiasis    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 Completed 2 yrs Prednisone after lung bx confirmation. No symptoms since then.   Suppurative hidradenitis    Thyroid cancer ([place])    THYROID NODULE, RIGHT 02/08/2007   Past Surgical History:  Procedure Laterality Date   BREAST EXCISIONAL BIOPSY Right 04/26/2018   right axilla negative   BREAST EXCISIONAL BIOPSY Left 04/26/2018   left axilla negative   BREAST LUMPECTOMY Right 10/03/2018   malignant   BREAST LUMPECTOMY WITH RADIOACTIVE SEED AND  SENTINEL LYMPH NODE BIOPSY Right 10/03/2018   Procedure: RIGHT BREAST LUMPECTOMY WITH RADIOACTIVE SEED AND SENTINEL LYMPH NODE MAPPING;  Surgeon: Erroll Luna, MD;  Location: [facility];  Service: General;  Laterality: Right;   BREAST SURGERY  1997   Breast Reduction    CYSTOSCOPY W/ URETERAL STENT REMOVAL  11/09/2012   Procedure: CYSTOSCOPY WITH STENT REMOVAL;  Surgeon: Alexis Frock, MD;  Location: [facility];  Service: Urology;  Laterality: Right;   CYSTOSCOPY WITH RETROGRADE PYELOGRAM, URETEROSCOPY AND STENT PLACEMENT  11/09/2012   Procedure: CYSTOSCOPY WITH RETROGRADE PYELOGRAM, URETEROSCOPY AND STENT PLACEMENT;  Surgeon: Alexis Frock, MD;  Location: [facility];  Service: Urology;  Laterality: Left;  LEFT URETEROSCOPY, STONE MANIPULATION, left STENT exchange    CYSTOSCOPY WITH STENT PLACEMENT  10/02/2012   Procedure: CYSTOSCOPY WITH STENT PLACEMENT;  Surgeon: Alexis Frock, MD;  Location: [facility];  Service: Urology;  Laterality: Left;   DILATION AND CURETTAGE OF UTERUS  Feb 2004   s/p for 1st trimester nonviable pregnancy   EYE SURGERY     sty under eyelid   INCISE AND DRAIN ABCESS  Nov 03   s/p I &D for righ inframmary fold hidradenitis   INCISION AND DRAINAGE PERITONSILLAR ABSCESS  Mar 03   IR CV LINE INJECTION  06/07/2018   IR IMAGING GUIDED PORT INSERTION  06/20/2018   IR REMOVAL TUN ACCESS W/ PORT W/O FL MOD SED  06/20/2018   IRRIGATION AND DEBRIDEMENT ABSCESS  01/31/2012   Procedure: IRRIGATION AND DEBRIDEMENT ABSCESS;  Surgeon: Shann Medal, MD;  Location: [facility];  Service: General;  Laterality: Right;  right breast and axilla    NEPHROLITHOTOMY  10/02/2012   Procedure: NEPHROLITHOTOMY PERCUTANEOUS;  Surgeon: Alexis Frock, MD;  Location: [facility];  Service: Urology;  Laterality: Right;  First Stage Percutaneous Nephrolithotomy with Surgeon Access, Left Ureteral Stent     NEPHROLITHOTOMY  10/04/2012   Procedure: NEPHROLITHOTOMY PERCUTANEOUS SECOND LOOK;  Surgeon: Alexis Frock, MD;  [facility];  Service: Urology;  Laterality: Right;      NEPHROLITHOTOMY  10/08/2012   Procedure: NEPHROLITHOTOMY PERCUTANEOUS;  Surgeon: Alexis Frock, MD;  Location[facility];  Service: Urology;  Laterality: Right;  THIRD STAGE, nephrostomy tube exchange x 2   NEPHROLITHOTOMY  10/11/2012   Procedure: NEPHROLITHOTOMY PERCUTANEOUS SECOND LOOK;  Surgeon: Alexis Frock, MD;  Location[facility];  Service: Urology;  Laterality: Right;  RIGHT 4 STAGE PERCUTANOUS NEPHROLITHOTOMY, right URETEROSCOPY WITH HOLMIUM LASER    PORTACATH PLACEMENT Left 05/17/2018   Procedure: INSERTION PORT-A-CATH;  Surgeon: Coralie Keens, MD;  Location: [facility];  Service: General;  Laterality: Left;   RADICAL NECK DISSECTION  04/12/2017   limited/notes 04/12/2017   RADICAL NECK DISSECTION N/A 04/12/2017   Procedure: RADICAL NECK DISSECTION;  Surgeon: Melida Quitter, MD;  Location: [facility];  Service: ENT;  Laterality: N/A;  limited neck dissection 2 hours total  REDUCTION MAMMAPLASTY Bilateral 1998   RIGHT/LEFT HEART CATH AND CORONARY ANGIOGRAPHY N/A 03/12/2020   Procedure: RIGHT/LEFT HEART CATH AND CORONARY ANGIOGRAPHY;  Surgeon: Jolaine Artist, MD;  Location: [facility];  Service: Cardiovascular;  Laterality: N/A;   [place]  04/12/2017   completion/notes 04/12/2017   THYROIDECTOMY N/A 04/12/2017   Procedure: THYROIDECTOMY;  Surgeon: Melida Quitter, MD;  Location: [facility];  Service: ENT;  Laterality: N/A;  Completion Thyroidectomy   TOTAL THYROIDECTOMY  2010     Current Outpatient Medications  Medication Sig Dispense Refill   Blood Glucose Monitoring Suppl (ONETOUCH VERIO) w/Device KIT USE AS INSTRUCTED TO TEST BLOOD SUGAR UP TO 4 TIMES DAILY E11.9 1 kit 0   dapagliflozin propanediol (FARXIGA) 10 MG TABS tablet Take 10 mg by mouth daily before breakfast. Start the day after your heart cath 30 tablet 5   DESCOVY 200-25 MG tablet TAKE 1 TABLET BY  MOUTH DAILY 30 tablet 2   doxycycline (VIBRA-TABS) 100 MG tablet doxycycline hyclate 100 mg tablet     FLOVENT HFA 110 MCG/ACT inhaler TAKE 1 PUFF BY MOUTH TWICE A DAY (Patient taking differently: Inhale 1 puff into the lungs daily as needed (Shortness of breath). ) 36 Inhaler 1   fluconazole (DIFLUCAN) 100 MG tablet Take 1 tablet (100 mg total) by mouth daily. 7 tablet 3   gabapentin (NEURONTIN) 300 MG capsule Take 1 capsule (300 mg total) by mouth 3 (three) times daily. (Patient taking differently: Take 300 mg by mouth 2 (two) times daily. ) 90 capsule 3   glipiZIDE (GLUCOTROL) 5 MG tablet TAKE 1 TABLET (5 MG TOTAL) BY MOUTH 2 (TWO) TIMES DAILY BEFORE A MEAL. FOR DIABETES. (Patient taking differently: Take 5 mg by mouth daily. ) 180 tablet 1   glucose blood (ONETOUCH VERIO) test strip USE AS INSTRUCTED TO TEST BLOOD SUGAR DAILY 100 each 2   irbesartan-hydrochlorothiazide (AVALIDE) 150-12.5 MG tablet TAKE 1 TABLET BY MOUTH EVERY DAY FOR BLOOD PRESSURE 90 tablet 2   Lancets (ONETOUCH ULTRASOFT) lancets Use as instructed to test blood sugar daily 100 each 5   levothyroxine (SYNTHROID) 200 MCG tablet Take 1 tablet (200 mcg total) by mouth daily before breakfast. 90 tablet 0   metoprolol succinate (TOPROL-XL) 50 MG 24 hr tablet TAKE 1 TABLET BY MOUTH EVERY DAY WITH OR IMMEDIATELY FOLLOWING A MEAL 90 tablet 0   Multiple Vitamins-Minerals (MULTIVITAMIN WITH MINERALS) tablet Take 1 tablet by mouth daily.     predniSONE (DELTASONE) 20 MG tablet Take 2 tablets daily for three days, then 1 tablet daily for three days for gout flare. 9 tablet 0   rosuvastatin (CRESTOR) 10 MG tablet TAKE 1 TABLET BY MOUTH EVERY DAY FOR CHOLESTEROL (Patient taking differently: Take 10 mg by mouth daily. FOR CHOLESTEROL) 90 tablet 3   spironolactone (ALDACTONE) 25 MG tablet TAKE 1 TABLET BY MOUTH EVERY DAY 90 tablet 3   tamoxifen (NOLVADEX) 20 MG tablet Take 1 tablet (20 mg total) by mouth daily. 90 tablet 1    [name] 50 MG tablet TAKE 1 TABLET(50 MG) BY MOUTH DAILY 30 tablet 2   TRULICITY 2.45 YK/9.9IP SOPN INJECT CONTENTS OF 1 PEN (0.$Remove'75MG'UWKIbNW$ ) INTO THE SKIN ONCE WEEKLY FOR DIABETES 1.5 mL 3   No current facility-administered medications for this encounter.   Facility-Administered Medications Ordered in Other Encounters  Medication Dose Route Frequency Provider Last Rate Last Admin   sodium chloride flush (NS) 0.9 % injection 10 mL  10 mL  Intracatheter PRN Magrinat, Virgie Dad, MD   10 mL at 09/19/18 1551    Allergies:   Genvoya [elviteg-cobic-emtricit-tenofaf], Lisinopril, and Valsartan   Social History:  The patient  reports that she has quit smoking. Her smoking use included cigarettes. She started smoking about 3 years ago. She has a 7.50 pack-year smoking history. She has never used smokeless tobacco. She reports current alcohol use. She reports that she does not use drugs.   Family History:  The patient's family history includes Breast cancer in her cousin, cousin, cousin, maternal aunt, paternal aunt, and paternal aunt; Breast cancer (age of onset: 83) in her cousin; Breast cancer (age of onset: 13) in her paternal aunt; Breast cancer (age of onset: 52) in her maternal aunt; Cancer in her maternal uncle and mother; Diabetes in an other family member; Heart disease in her mother and another family member; Hypertension in her brother, father, mother, sister, sister, and another family member; Kidney disease in an other family member; Lung cancer in her paternal uncle; Lung cancer (age of onset: 79) in her father; Prostate cancer in her paternal uncle and paternal uncle; Stroke in an other family member.   ROS:  Please see the history of present illness.   All other systems are personally reviewed and negative.   Vitals:   07/08/20 1112  BP: (!) 158/72  Pulse: 78  SpO2: 96%    Exam:  General:  Well appearing. No resp difficulty HEENT: normal Neck: supple. no JVD. Carotids 2+ bilat; no bruits.  No lymphadenopathy or thryomegaly appreciated. Cor: PMI nondisplaced. Regular rate & rhythm. No rubs, gallops or murmurs. Lungs: clear Abdomen: obese soft, nontender, nondistended. No hepatosplenomegaly. No bruits or masses. Good bowel sounds. Extremities: no cyanosis, clubbing, rash, edema Neuro: alert & orientedx3, cranial nerves grossly intact. moves all 4 extremities w/o difficulty. Affect pleasant    Recent Labs: 07/11/2019: B Natriuretic Peptide 22.5 01/27/2020: TSH 17.400 02/12/2020: ALT 26 03/12/2020: BUN 28; Creatinine, Ser 1.73; Hemoglobin 11.9; Platelets 186; Potassium 2.9; Sodium 144  Personally reviewed   Wt Readings from Last 3 Encounters:  06/16/20 107.5 kg (237 lb)  05/12/20 (!) 107.7 kg (237 lb 6.4 oz)  03/12/20 106.1 kg (234 lb)      ASSESSMENT AND PLAN:  1. Dyspnea - echo 9/20  EF 55-60% Grade I DD  - echo 5/21 EF 55-60% Grade I DD - Initial ReDS 43% suggesting elevated lung water. F/u ReDS was 39%.  Arlyce Harman stopped to A/CKD - Volume status ok on exam and recent RHC - R/L cath in 5/21 with normal filling pressures and CO. Mild PH - Sleep study ordered but unable to get it because lost insurance. Now has insurance again and will re-order - Remains NYHA III. Cause of dyspnea unclear. At this point I doubt HF but will do CPX test to further evaluate  2. R Breast Cancer - EF and Doppler parameters stabl e and echo  - Has finished Herceptin  - Echo 5/21 Echo 55-60%   3. HTN - Blood pressure remains elevated - Will refer to HTN Clinic - Check sleep study  4. Acute on CKD3 - baseline creatinine 1.5-1.9.  - most recent creatinine 1.7 - Follows with [facility] (Dr. Posey Pronto) - Unable to afford [place]. Will try Jardiance  5. Snoring - likely has OSA - re-order sleep study   6. DM2 - last HGBa1c 7.1 - Unable to afford [place]. Will try Jardiance  7. Obesity - encouraged weight loss  8. Coronary calcifications on CT - cath 5/21. No  CAD  Signed, Glori Bickers, MD  07/06/2020 12:29 PM  [facility] [address] [facility] [place] [id number] [phone] (office) [phone] (fax)

## 2020-07-08 ENCOUNTER — Encounter (HOSPITAL_COMMUNITY): Payer: Self-pay | Admitting: Internal Medicine

## 2020-07-08 ENCOUNTER — Ambulatory Visit: Payer: 59

## 2020-07-08 ENCOUNTER — Ambulatory Visit (HOSPITAL_COMMUNITY)
Admission: RE | Admit: 2020-07-08 | Discharge: 2020-07-08 | Disposition: A | Discharge status: Home / Self Care | Payer: 59 | Source: Ambulatory Visit | Attending: Internal Medicine | Admitting: Internal Medicine

## 2020-07-08 ENCOUNTER — Other Ambulatory Visit: Payer: Self-pay

## 2020-07-08 VITALS — BP 158/72 | HR 78 | Ht 65.0 in | Wt 237.2 lb

## 2020-07-08 DIAGNOSIS — B20 Human immunodeficiency virus [HIV] disease: Secondary | ICD-10-CM | POA: Insufficient documentation

## 2020-07-08 DIAGNOSIS — R0683 Snoring: Secondary | ICD-10-CM | POA: Diagnosis not present

## 2020-07-08 DIAGNOSIS — Z7984 Long term (current) use of oral hypoglycemic drugs: Secondary | ICD-10-CM | POA: Insufficient documentation

## 2020-07-08 DIAGNOSIS — Z923 Personal history of irradiation: Secondary | ICD-10-CM | POA: Insufficient documentation

## 2020-07-08 DIAGNOSIS — Z6839 Body mass index (BMI) 39.0-39.9, adult: Secondary | ICD-10-CM | POA: Insufficient documentation

## 2020-07-08 DIAGNOSIS — D869 Sarcoidosis, unspecified: Secondary | ICD-10-CM | POA: Diagnosis not present

## 2020-07-08 DIAGNOSIS — E669 Obesity, unspecified: Secondary | ICD-10-CM | POA: Insufficient documentation

## 2020-07-08 DIAGNOSIS — E1122 Type 2 diabetes mellitus with diabetic chronic kidney disease: Secondary | ICD-10-CM | POA: Insufficient documentation

## 2020-07-08 DIAGNOSIS — R0600 Dyspnea, unspecified: Secondary | ICD-10-CM | POA: Diagnosis present

## 2020-07-08 DIAGNOSIS — N183 Chronic kidney disease, stage 3 unspecified: Secondary | ICD-10-CM | POA: Insufficient documentation

## 2020-07-08 DIAGNOSIS — J45909 Unspecified asthma, uncomplicated: Secondary | ICD-10-CM | POA: Insufficient documentation

## 2020-07-08 DIAGNOSIS — Z87891 Personal history of nicotine dependence: Secondary | ICD-10-CM | POA: Insufficient documentation

## 2020-07-08 DIAGNOSIS — Z79899 Other long term (current) drug therapy: Secondary | ICD-10-CM | POA: Insufficient documentation

## 2020-07-08 DIAGNOSIS — I130 Hypertensive heart and chronic kidney disease with heart failure and stage 1 through stage 4 chronic kidney disease, or unspecified chronic kidney disease: Secondary | ICD-10-CM | POA: Diagnosis not present

## 2020-07-08 DIAGNOSIS — G4733 Obstructive sleep apnea (adult) (pediatric): Secondary | ICD-10-CM | POA: Diagnosis not present

## 2020-07-08 DIAGNOSIS — I509 Heart failure, unspecified: Secondary | ICD-10-CM | POA: Diagnosis not present

## 2020-07-08 DIAGNOSIS — Z7951 Long term (current) use of inhaled steroids: Secondary | ICD-10-CM | POA: Insufficient documentation

## 2020-07-08 DIAGNOSIS — I10 Essential (primary) hypertension: Secondary | ICD-10-CM | POA: Diagnosis not present

## 2020-07-08 DIAGNOSIS — Z9221 Personal history of antineoplastic chemotherapy: Secondary | ICD-10-CM | POA: Diagnosis not present

## 2020-07-08 DIAGNOSIS — Z853 Personal history of malignant neoplasm of breast: Secondary | ICD-10-CM | POA: Diagnosis not present

## 2020-07-08 DIAGNOSIS — E0500 Thyrotoxicosis with diffuse goiter without thyrotoxic crisis or storm: Secondary | ICD-10-CM | POA: Insufficient documentation

## 2020-07-08 DIAGNOSIS — J4530 Mild persistent asthma, uncomplicated: Secondary | ICD-10-CM

## 2020-07-08 DIAGNOSIS — I2510 Atherosclerotic heart disease of native coronary artery without angina pectoris: Secondary | ICD-10-CM | POA: Diagnosis not present

## 2020-07-08 MED ORDER — EMPAGLIFLOZIN 10 MG PO TABS: 10.0000 mg | ORAL_TABLET | Freq: Every day | ORAL | 6 refills | Status: DC

## 2020-07-08 NOTE — Patient Instructions (Addendum)
START Jardiance 10mg  Daily  Your physician has recommended that you have a sleep study. This test records several body functions during sleep, including: brain activity, eye movement, oxygen and carbon dioxide blood levels, heart rate and rhythm, breathing rate and rhythm, the flow of air through your mouth and nose, snoring, body muscle movements, and chest and belly movement. THEY WILL CONTACT YOU TO SCHEDULE  Your physician has recommended that you have a cardiopulmonary stress test (CPX). CPX testing is a non-invasive measurement of heart and lung function. It replaces a traditional treadmill stress test. This type of test provides a tremendous amount of information that relates not only to your present condition but also for future outcomes. This test combines measurements of you ventilation, respiratory gas exchange in the lungs, electrocardiogram (EKG), blood pressure and physical response before, during, and following an exercise protocol.  You have been referred to Hypertension clinic. They will contact you to schedule an appointment  Your physician recommends that you follow up with our office in 6 months  If you have any questions or concerns before your next appointment please send [place] a message through [place] or call our office at [phone].    TO LEAVE A MESSAGE FOR THE NURSE SELECT OPTION 2, PLEASE LEAVE A MESSAGE INCLUDING: . YOUR NAME . DATE OF BIRTH . CALL BACK NUMBER . REASON FOR CALL**this is important as we prioritize the call backs  [place] AS LONG AS YOU CALL BEFORE 4:00 PM  At the [facility], you and your health needs are our priority. As part of our continuing mission to provide you with exceptional heart care, we have created designated Provider Care Teams. These Care Teams include your primary Cardiologist (physician) and Advanced Practice Providers (APPs- Physician Assistants and Nurse Practitioners) who all work  together to provide you with the care you need, when you need it.   You may see any of the following providers on your designated Care Team at your next follow up: Marland Kitchen Dr Glori Bickers . Dr Loralie Champagne . Darrick Grinder, NP . Lyda Jester, PA . Audry Riles, PharmD   Please be sure to bring in all your medications bottles to every appointment.

## 2020-07-08 NOTE — Addendum Note (Signed)
Encounter addended by: Malena Edman, RN on: 07/08/2020 12:02 PM  Actions taken: Clinical Note Signed

## 2020-07-08 NOTE — Addendum Note (Signed)
Encounter addended by: Malena Edman, RN on: 07/08/2020 12:08 PM  Actions taken: Clinical Note Signed

## 2020-07-08 NOTE — Progress Notes (Signed)
Height:     Weight: BMI:  Today's Date:  STOP BANG RISK ASSESSMENT S (snore) Have you been told that you snore?     YES   T (tired) Are you often tired, fatigued, or sleepy during the day?   YES  O (obstruction) Do you stop breathing, choke, or gasp during sleep? YES   P (pressure) Do you have or are you being treated for high blood pressure? YES   B (BMI) Is your body index greater than 35 kg/m? YES   A (age) Are you 52 years old or older? YES   N (neck) Do you have a neck circumference greater than 16 inches?   NO   G (gender) Are you a female? NO   TOTAL STOP/BANG "YES" ANSWERS                                                                        For Office Use Only              Procedure Order Form    YES to 3+ Stop Bang questions OR two clinical symptoms - patient qualifies for WatchPAT (CPT 95800)      Clinical Notes: Will consult Sleep Specialist and refer for management of therapy due to patient increased risk of Sleep Apnea. Ordering a sleep study due to the following two clinical symptoms: Excessive daytime sleepiness G47.10 / Difficulty concentrating R41.840 / Loud snoring R06.83/ History of high blood pressure R03.0   Which test do you need, WP1 or WP300???  . Do you have access to a smart device containing the app stores?  [id number]

## 2020-07-08 NOTE — Addendum Note (Signed)
Encounter addended by: Malena Edman, RN on: 07/08/2020 12:01 PM  Actions taken: Alternative orders not taken and original order placed, Visit diagnoses modified, Clinical Note Signed, Pharmacy for encounter modified, Order list changed, Diagnosis association updated

## 2020-07-09 ENCOUNTER — Encounter: Payer: Self-pay | Admitting: Infectious Disease

## 2020-07-13 ENCOUNTER — Ambulatory Visit: Payer: 59 | Admitting: Physical Therapy

## 2020-07-13 ENCOUNTER — Telehealth: Payer: Self-pay | Admitting: Physical Therapy

## 2020-07-13 ENCOUNTER — Other Ambulatory Visit (HOSPITAL_COMMUNITY)
Admission: RE | Admit: 2020-07-13 | Discharge: 2020-07-13 | Disposition: A | Discharge status: Home / Self Care | Payer: 59 | Source: Ambulatory Visit | Attending: Surgery | Admitting: Surgery

## 2020-07-13 DIAGNOSIS — Z01812 Encounter for preprocedural laboratory examination: Secondary | ICD-10-CM | POA: Insufficient documentation

## 2020-07-13 DIAGNOSIS — Z20822 Contact with and (suspected) exposure to covid-19: Secondary | ICD-10-CM | POA: Insufficient documentation

## 2020-07-13 NOTE — Progress Notes (Signed)
[facility]  [phone]) [phone] Fax:(336) [phone]    ID: [name] DOB: 06/19/1966  MR#: [id number]  TDV#:[id number]  Patient Care Team: [name], NP as PCP - General (Internal Medicine) Michel Bickers, MD as PCP - Infectious Diseases (Infectious Diseases) Taylynn Easton, Virgie Dad, MD as Consulting Physician (Oncology) Kyung Rudd, MD as Consulting Physician (Radiation Oncology) Erroll Luna, MD as Consulting Physician (General Surgery) OTHER MD:   CHIEF COMPLAINT: Triple positive breast cancer  CURRENT TREATMENT: tamoxifen   INTERVAL HISTORY: [name] returns today for follow-up of her triple positive breast cancer.    She continues on tamoxifen.  It makes her sweat.  She does not have vaginal discharge associated with this.  Since her last visit, she underwent bilateral diagnostic mammography with tomography at [facility] on 07/06/2020 showing: breast density category B; no evidence of malignancy in either breast.   She also underwent cardiac catheterization and coronary angiography on 03/12/2020 under Dr. Haroldine Laws. Study showed: normal coronary arteries; ejection fraction of 60-65%.  She also underwent removal of a cyst on her right cheek on 04/21/2020 under Dr. Marla Roe. Pathology from the procedure [id number]) showed an epidermal inclusion cyst.  She is scheduled for port removal on 07/16/2020.   REVIEW OF SYSTEMS: [name] is still "not right".  She has had a lot of mental fog and in the middle of his statement well stop so her family is wondering what she is saying.  She has significant neuropathy issues in her hands particularly which feels like stabbing pains.  Occasionally she has problems in her feet.  She has also developed some gout she says.  She is currently on prednisone.  Of course that affects her blood sugar.  All of this means she simply is not ready to get back to work anytime soon and she is having difficulty getting  disability.  A detailed review of systems today was otherwise stable   HISTORY OF CURRENT ILLNESS: From the original intake note:  [name] (pronounced "Huff") palpated a mass in the right breast for about 2 weeks before bringing it to medical attention. She underwent bilateral diagnostic mammography with tomography and right breast ultrasonography at [facility] on 04/20/2018 showing: breast density category B. There was a highly suspicious mass located at 3 o'clock in the upper inner quadrant measuring 2.9 x 2.5 x 1.6 cm and 4 cm from the nipple. Ultrasound of the right axilla showed 5 lymph nodes with abnormal cortical thickening. The left axilla showed 3 lymph nodes with abnormal cortical thickening.  Accordingly on 04/26/2018 she proceeded to biopsy of the right breast area in question. The pathology from this procedure showed ([id number]): Invasive ductal carcinoma, grade II with calcifications. One right axillary lymph node and one left axillary lymph node were negative for carcinoma. Prognostic indicators significant for: estrogen receptor, 100% positive and progesterone receptor, 80% positive, both with strong staining intensity. Proliferation marker Ki67 at 70%. HER2 amplified with ratios HER2/CEP17 signals 4.52 and average HER2 copies per cell 10.85  The patient's subsequent history is as detailed below.   PAST MEDICAL HISTORY: Past Medical History:  Diagnosis Date  . Allergy   . Anemia    Normocytic  . Anxiety   . Asthma   . Blood dyscrasia   . Bronchitis 2005  . 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 Diabetes mellitus without complication ([place])   . Family history of breast cancer   . Family history of lung cancer   . Family history  of prostate cancer   . Gastroenteritis 07/10/07  . GERD 07/24/2006  . GRAVE'S DISEASE 01/01/2008  . History of hidradenitis suppurativa   . History of kidney stones   . History of thrush   . HIV DISEASE 07/24/2006   dx  March 05  . Hyperlipidemia   . HYPERTENSION 07/24/2006  . Hyperthyroidism 08/2006   Grave's Disease -diffuse radiotracer uptake 08/25/06 Thyroid scan-Cold nodule to R lower lobe of thyrorid  . Menometrorrhagia    hx of  . Nephrolithiasis   . 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 Personal history of chemotherapy    2020  . Personal history of radiation therapy    2020  . Pneumonia 2005  . 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 Sarcoidosis 02/08/2007   dx as a teenager in [place] from abnl CXR. Completed 2 yrs Prednisone after lung bx confirmation. No symptoms since then.  . Suppurative hidradenitis   . Thyroid cancer ([place])   . THYROID NODULE, RIGHT 02/08/2007  -2018 thyroid nodules were precancerous but pathology was negative for thyroid cancer.    PAST SURGICAL HISTORY: Past Surgical History:  Procedure Laterality Date  . BREAST EXCISIONAL BIOPSY Right 04/26/2018   right axilla negative  . BREAST EXCISIONAL BIOPSY Left 04/26/2018   left axilla negative  . BREAST LUMPECTOMY Right 10/03/2018   malignant  . BREAST LUMPECTOMY WITH RADIOACTIVE SEED AND SENTINEL LYMPH NODE BIOPSY Right 10/03/2018   Procedure: RIGHT BREAST LUMPECTOMY WITH RADIOACTIVE SEED AND SENTINEL LYMPH NODE MAPPING;  Surgeon: Erroll Luna, MD;  Location: [facility];  Service: General;  Laterality: Right;  . BREAST SURGERY  1997   Breast Reduction   . CYSTOSCOPY W/ URETERAL STENT REMOVAL  11/09/2012   Procedure: CYSTOSCOPY WITH STENT REMOVAL;  Surgeon: Alexis Frock, MD;  Location: [facility];  Service: Urology;  Laterality: Right;  . CYSTOSCOPY WITH RETROGRADE PYELOGRAM, URETEROSCOPY AND STENT PLACEMENT  11/09/2012   Procedure: CYSTOSCOPY WITH RETROGRADE PYELOGRAM, URETEROSCOPY AND STENT PLACEMENT;  Surgeon: Alexis Frock, MD;  Location: [facility];  Service: Urology;  Laterality: Left;  LEFT URETEROSCOPY, STONE MANIPULATION, left STENT exchange   .  CYSTOSCOPY WITH STENT PLACEMENT  10/02/2012   Procedure: CYSTOSCOPY WITH STENT PLACEMENT;  Surgeon: Alexis Frock, MD;  Location: [facility];  Service: Urology;  Laterality: Left;  . DILATION AND CURETTAGE OF UTERUS  Feb 2004   s/p for 1st trimester nonviable pregnancy  . EYE SURGERY     sty under eyelid  . INCISE AND DRAIN ABCESS  Nov 03   s/p I &D for righ inframmary fold hidradenitis  . INCISION AND DRAINAGE PERITONSILLAR ABSCESS  Mar 03  . IR CV LINE INJECTION  06/07/2018  . IR IMAGING GUIDED PORT INSERTION  06/20/2018  . IR REMOVAL TUN ACCESS W/ PORT W/O FL MOD SED  06/20/2018  . IRRIGATION AND DEBRIDEMENT ABSCESS  01/31/2012   Procedure: IRRIGATION AND DEBRIDEMENT ABSCESS;  Surgeon: Shann Medal, MD;  Location[facility];  Service: General;  Laterality: Right;  right breast and axilla   . NEPHROLITHOTOMY  10/02/2012   Procedure: NEPHROLITHOTOMY PERCUTANEOUS;  Surgeon: Alexis Frock, MD;  [facility];  Service: Urology;  Laterality: Right;  First Stage Percutaneous Nephrolithotomy with Surgeon Access, Left Ureteral Stent    . NEPHROLITHOTOMY  10/04/2012   Procedure: NEPHROLITHOTOMY PERCUTANEOUS SECOND LOOK;  Surgeon: Alexis Frock, MD;  [facility];  Service: Urology;  Laterality: Right;     . NEPHROLITHOTOMY  10/08/2012  Procedure: NEPHROLITHOTOMY PERCUTANEOUS;  Surgeon: Alexis Frock, MD;  Location[facility];  Service: Urology;  Laterality: Right;  THIRD STAGE, nephrostomy tube exchange x 2  . NEPHROLITHOTOMY  10/11/2012   Procedure: NEPHROLITHOTOMY PERCUTANEOUS SECOND LOOK;  Surgeon: Alexis Frock, MD;  Location[facility];  Service: Urology;  Laterality: Right;  RIGHT 4 STAGE PERCUTANOUS NEPHROLITHOTOMY, right URETEROSCOPY WITH HOLMIUM LASER   . PORTACATH PLACEMENT Left 05/17/2018   Procedure: INSERTION PORT-A-CATH;  Surgeon: Coralie Keens, MD;  Location: [facility];  Service: General;  Laterality: Left;  . RADICAL NECK DISSECTION  04/12/2017   limited/notes  04/12/2017  . RADICAL NECK DISSECTION N/A 04/12/2017   Procedure: RADICAL NECK DISSECTION;  Surgeon: Melida Quitter, MD;  Location: [facility];  Service: ENT;  Laterality: N/A;  limited neck dissection 2 hours total  . REDUCTION MAMMAPLASTY Bilateral 1998  . RIGHT/LEFT HEART CATH AND CORONARY ANGIOGRAPHY N/A 03/12/2020   Procedure: RIGHT/LEFT HEART CATH AND CORONARY ANGIOGRAPHY;  Surgeon: Jolaine Artist, MD;  Location: [facility];  Service: Cardiovascular;  Laterality: N/A;  . [place]  1994  . THYROIDECTOMY  04/12/2017   completion/notes 04/12/2017  . THYROIDECTOMY N/A 04/12/2017   Procedure: THYROIDECTOMY;  Surgeon: Melida Quitter, MD;  Location: [facility];  Service: ENT;  Laterality: N/A;  Completion Thyroidectomy  . TOTAL THYROIDECTOMY  2010  Thyroid nodules removed- pathology at the ENT doctor 2018 were benign -Over active thyroid and thyroidectomy with benign pathology   FAMILY HISTORY Family History  Problem Relation Age of Onset  . Hypertension Mother   . Cancer Mother        laryngeal  . Heart disease Mother        stent  . Hypertension Father   . Lung cancer Father 60       hx smoking  . Heart disease Other   . Hypertension Other   . Stroke Other        Grandparent  . Kidney disease Other        Grandparent  . Diabetes Other        FH of Diabetes  . Hypertension Sister   . Cancer Maternal Uncle        Lung CA  . Hypertension Brother   . Hypertension Sister   . Breast cancer Maternal Aunt 65  . Breast cancer Paternal Aunt 6  . Prostate cancer Paternal Uncle   . Breast cancer Maternal Aunt        dx 60+  . Breast cancer Paternal Aunt        dx 63's  . Breast cancer Paternal Aunt        dx 50's  . Prostate cancer Paternal Uncle   . Lung cancer Paternal Uncle   . Breast cancer Cousin 75  . Breast cancer Cousin        dx <50  . Breast cancer Cousin        dx <50  . Breast cancer Cousin        dx <50  . Colon cancer Neg Hx   . Esophageal cancer Neg Hx   .  Rectal cancer Neg Hx   . Stomach cancer Neg Hx   The patient's mother is alive at age 69. The patient's father died at 24 from lung cancer (heavy smoker). She does not know much about her father. The patient has 1 brother and 2 sisters. The patient's mother had a history of laryngeal cancer. There was a maternal cousin with breast cancer  diagnosed at age 71. There were 2 maternal aunts with breast cancer, the youngest being diagnosed in the 56's. There was a maternal great aunt with breast cancer. There were 2 paternal aunts with breast cancer.    GYNECOLOGIC HISTORY:  Patient's last menstrual period was 03/31/2014. Menarche: 54 years old Age at first live birth: 54 years old She is GX P1.  Her LMP was in 2017. She did not use HRT.    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 She is single. At home is the patient's daughter, [name], age 46, who is a Engineer, technical sales education and working in Therapist, art.  The patient attends [employer].   ADVANCED DIRECTIVES: Not in place; at the 05/10/2018 visit the patient was given the appropriate documents to complete and notarized at her discretion   HEALTH MAINTENANCE: Social History   Tobacco Use  . Smoking status: Former Smoker    Packs/day: 0.50    Years: 15.00    Pack years: 7.50    Types: Cigarettes    Start date: 04/12/2017  . Smokeless tobacco: Never Used  . Tobacco comment: quit smoking  may 2018  Vaping Use  . Vaping Use: Never used  Substance Use Topics  . Alcohol use: Yes    Alcohol/week: 0.0 standard drinks    Comment: social  . Drug use: No     Colonoscopy: no  PAP: [facility]. 2018  Bone density: remote   Allergies  Allergen Reactions  . Genvoya [Elviteg-Cobic-Emtricit-Tenofaf] Hives  . Lisinopril Cough  . Valsartan Other (See Comments) and Cough    Pt states she tolerates medicine now    Current Outpatient  Medications  Medication Sig Dispense Refill  . Blood Glucose Monitoring Suppl (ONETOUCH VERIO) w/Device KIT USE AS INSTRUCTED TO TEST BLOOD SUGAR UP TO 4 TIMES DAILY E11.9 1 kit 0  . dapagliflozin propanediol (FARXIGA) 10 MG TABS tablet Take 10 mg by mouth daily before breakfast. Start the day after your heart cath 30 tablet 5  . DESCOVY 200-25 MG tablet TAKE 1 TABLET BY MOUTH DAILY 30 tablet 2  . doxycycline (VIBRA-TABS) 100 MG tablet doxycycline hyclate 100 mg tablet    . empagliflozin (JARDIANCE) 10 MG TABS tablet Take 1 tablet (10 mg total) by mouth daily before breakfast. 30 tablet 6  . FLOVENT HFA 110 MCG/ACT inhaler TAKE 1 PUFF BY MOUTH TWICE A DAY (Patient taking differently: Inhale 1 puff into the lungs daily as needed (Shortness of breath). ) 36 Inhaler 1  . gabapentin (NEURONTIN) 300 MG capsule Take 1 capsule (300 mg total) by mouth 3 (three) times daily. (Patient taking differently: Take 300 mg by mouth 2 (two) times daily. ) 90 capsule 3  . glipiZIDE (GLUCOTROL) 5 MG tablet TAKE 1 TABLET (5 MG TOTAL) BY MOUTH 2 (TWO) TIMES DAILY BEFORE A MEAL. FOR DIABETES. (Patient taking differently: Take 5 mg by mouth daily. ) 180 tablet 1  . glucose blood (ONETOUCH VERIO) test strip USE AS INSTRUCTED TO TEST BLOOD SUGAR DAILY 100 each 2  . irbesartan-hydrochlorothiazide (AVALIDE) 150-12.5 MG tablet TAKE 1 TABLET BY MOUTH EVERY DAY FOR BLOOD PRESSURE 90 tablet 2  . Lancets (ONETOUCH ULTRASOFT) lancets Use as instructed to test blood sugar daily 100 each 5  . levothyroxine (SYNTHROID) 200 MCG tablet Take 1 tablet (200 mcg total) by mouth daily before breakfast. 90 tablet 0  . metoprolol succinate (TOPROL-XL) 50 MG 24 hr  tablet TAKE 1 TABLET BY MOUTH EVERY DAY WITH OR IMMEDIATELY FOLLOWING A MEAL 90 tablet 0  . predniSONE (DELTASONE) 20 MG tablet Take 2 tablets daily for three days, then 1 tablet daily for three days for gout flare. 9 tablet 0  . rosuvastatin (CRESTOR) 10 MG tablet TAKE 1 TABLET BY  MOUTH EVERY DAY FOR CHOLESTEROL (Patient taking differently: Take 10 mg by mouth daily. FOR CHOLESTEROL) 90 tablet 3  . spironolactone (ALDACTONE) 25 MG tablet TAKE 1 TABLET BY MOUTH EVERY DAY 90 tablet 3  . tamoxifen (NOLVADEX) 20 MG tablet Take 1 tablet (20 mg total) by mouth daily. 90 tablet 1  . [name] 50 MG tablet TAKE 1 TABLET(50 MG) BY MOUTH DAILY 30 tablet 2  . TRULICITY 9.93 ZJ/6.9CV SOPN INJECT CONTENTS OF 1 PEN (0.$Remove'75MG'GxLNSRD$ ) INTO THE SKIN ONCE WEEKLY FOR DIABETES 1.5 mL 3   No current facility-administered medications for this visit.   Facility-Administered Medications Ordered in Other Visits  Medication Dose Route Frequency Provider Last Rate Last Admin  . sodium chloride flush (NS) 0.9 % injection 10 mL  10 mL Intracatheter PRN Lynnzie Blackson, Virgie Dad, MD   10 mL at 09/19/18 1551    OBJECTIVE: African-American woman who appears stated age  47:   07/14/20 1117  BP: (!) 169/83  Pulse: 75  Resp: 18  Temp: (!) 97.2 F (36.2 C)  SpO2: 100%   Wt Readings from Last 3 Encounters:  07/14/20 236 lb 9.6 oz (107.3 kg)  07/08/20 237 lb 3.2 oz (107.6 kg)  06/16/20 237 lb (107.5 kg)   Body mass index is 39.37 kg/m.    ECOG FS:1 - Symptomatic but completely ambulatory  Sclerae unicteric, EOMs intact Wearing a mask No cervical or supraclavicular adenopathy Lungs no rales or rhonchi Heart regular rate and rhythm Abd soft, nontender, positive bowel sounds MSK no focal spinal tenderness, no upper extremity lymphedema Neuro: nonfocal, well oriented, appropriate affect Breasts: The right breast is status post lumpectomy and radiation.  There is hyperpigmentation and coarsening of the skin as expected but there is no evidence of local recurrence.  The left breast is unremarkable.  Both axillae are benign.   LAB RESULTS:  CMP     Component Value Date/Time   NA 144 03/12/2020 1219   K 2.9 (L) 03/12/2020 1219   CL 99 03/12/2020 1112   CO2 26 03/12/2020 1112   GLUCOSE 203 (H)  03/12/2020 1112   BUN 28 (H) 03/12/2020 1112   CREATININE 1.73 (H) 03/12/2020 1112   CREATININE 1.98 (H) 08/12/2019 1447   CREATININE 1.02 09/23/2015 1047   CALCIUM 8.4 (L) 03/12/2020 1112   PROT 7.6 02/12/2020 1017   ALBUMIN 2.9 (L) 02/12/2020 1017   AST 34 02/12/2020 1017   AST 24 08/12/2019 1447   ALT 26 02/12/2020 1017   ALT 16 08/12/2019 1447   ALKPHOS 90 02/12/2020 1017   BILITOT 0.2 (L) 02/12/2020 1017   BILITOT 0.2 (L) 08/12/2019 1447   GFRNONAA 34 (L) 03/12/2020 1112   GFRNONAA 29 (L) 08/12/2019 1447   GFRAA 39 (L) 03/12/2020 1112   GFRAA 33 (L) 08/12/2019 1447    No results found for: TOTALPROTELP, ALBUMINELP, A1GS, A2GS, BETS, BETA2SER, GAMS, MSPIKE, SPEI  No results found for: KPAFRELGTCHN, LAMBDASER, KAPLAMBRATIO  Lab Results  Component Value Date   WBC 7.2 07/14/2020   NEUTROABS 4.4 07/14/2020   HGB 12.9 07/14/2020   HCT 40.2 07/14/2020   MCV 97.1 07/14/2020   PLT 204 07/14/2020   No  results found for: LABCA2  No components found for: [id number]  No results for input(s): INR in the last 168 hours.  No results found for: LABCA2  No results found for: HYW737  No results found for: TGG269  No results found for: SWN462  No results found for: CA2729  No components found for: HGQUANT  No results found for: CEA1 / No results found for: CEA1   No results found for: AFPTUMOR  No results found for: CHROMOGRNA  No results found for: HGBA, HGBA2QUANT, HGBFQUANT, HGBSQUAN (Hemoglobinopathy evaluation)   No results found for: LDH  Lab Results  Component Value Date   IRON 26 (L) 02/08/2007   TIBC 371 02/08/2007   IRONPCTSAT 7 (L) 02/08/2007   (Iron and TIBC)  No results found for: FERRITIN  Urinalysis    Component Value Date/Time   COLORURINE RED (A) 12/10/2019 1145   APPEARANCEUR TURBID (A) 12/10/2019 1145   LABSPEC 1.017 12/10/2019 1145   PHURINE < OR = 5.0 12/10/2019 1145   GLUCOSEU NEGATIVE 12/10/2019 1145   GLUCOSEU NEG mg/dL  01/23/2008 1418   HGBUR 2+ (A) 12/10/2019 1145   BILIRUBINUR NEGATIVE 10/14/2019 1600   BILIRUBINUR negative 12/29/2017 1046   KETONESUR NEGATIVE 12/10/2019 1145   PROTEINUR 3+ (A) 12/10/2019 1145   UROBILINOGEN 0.2 12/29/2017 1046   UROBILINOGEN 0.2 10/21/2014 2347   NITRITE POSITIVE (A) 12/10/2019 1145   LEUKOCYTESUR 3+ (A) 12/10/2019 1145    STUDIES: MM DIAG BREAST TOMO BILATERAL  Result Date: 07/06/2020 CLINICAL DATA:  54 year old female status post malignant right lumpectomy in 2019. No current breast related issues. EXAM: DIGITAL DIAGNOSTIC BILATERAL MAMMOGRAM WITH TOMO AND CAD COMPARISON:  Previous exam(s). ACR Breast Density Category b: There are scattered areas of fibroglandular density. FINDINGS: Stable post lumpectomy changes are noted in the lower inner quadrant of the right breast. No new or suspicious findings are identified in either breast. The parenchymal pattern is stable. Mammographic images were processed with CAD. IMPRESSION: 1. No mammographic evidence of malignancy in either breast. 2. Stable right breast posttreatment changes. RECOMMENDATION: Diagnostic mammogram is suggested in 1 year. (Code:[id number]-B-01Y) I have discussed the findings and recommendations with the patient. If applicable, a reminder letter will be sent to the patient regarding the next appointment. BI-RADS CATEGORY  2: Benign. Electronically Signed   By: Kristopher Oppenheim M.D.   On: 07/06/2020 14:55     ELIGIBLE FOR AVAILABLE RESEARCH PROTOCOL: declined DCP study  ASSESSMENT: 54 y.o. [place], [place] woman status post right breast upper inner quadrant biopsy 04/26/2018 for a clinical T2 N0, stage IB invasive ductal carcinoma, grade 2, ER/PR positive, HER-2 positive with an MIB-1 of 70%.  (1) genetics testing 07/03/2018: no pathogenic mutations. Genes Analyzed (43 total): APC*, ATM*, AXIN2, BARD1, BMPR1A, BRCA1*, BRCA2*, BRIP1*, CDH1*, CDK4, CDKN2A, CHEK2*, DICER1,GALNT12, HOXB13, MEN1, MLH1*, MRE11A, MSH2*, MSH3,  MSH6*, MUTYH*, NBN, NF1*, NTHL1, PALB2*, PMS2*, POLD1, POLE,PTEN*, RAD50, RAD51C*, RAD51D*, RET, SDHB, SDHD, SMAD4, SMARCA4, STK11 and TP53* (sequencing and deletion/duplication); EGFR (sequencing only); EPCAM and GREM1 (deletion/duplication only). DNA and RNA analyses performed for * genes.  (2) neoadjuvant chemotherapy consisting of carboplatin, docetaxel, trastuzumab and pertuzumab given every 21 days x 6, starting 05/31/2018, completed 09/14/2018  (a) Pertuzumab discontinued starting with cycle 2 because of diarrhea problems  (b) Taxotere stopped after 4 cycles due peripheral neuropathy and substituted for last two cycles with gemcitabine  (3)  definitive surgery on 10/03/2018 showed residual ypT1c ypN0 invasive ductal carcinoma, grade 2, with negative margins; a total of 5 sentinel  lymph nodes removed; repeat prognostic panel again triple positive  (4) T-DM1 given adjuvantly due to residual disease: started 11/13/2018, completed 11/04/2019.  (a) echo on 05/16/2018 shows an EF of 60-65%.  (b) echo on 08/27/18 shows an EF 55-60%  (c) echo on 12/03/2018 shows an EF of 60-65%  (d) echocardiogram on 03/06/2019 showed an ejection fraction of 55% as read by Dr. Haroldine Laws.  (e) echocardiogram 06/20/2019 shows an ejection fraction in the 55-60% range  (f) echo 02/20/2020 shows an ejection fraction in the 60-65% range.  (5) adjuvant radiation completed 01/07/2019 - 02/21/2019 (delayed due to wound healing issues)  Site/dose:   The patient initially received a dose of 50.4 Gy in 28 fractions to the right breast using whole-breast tangent fields. This was delivered using a 3-D conformal technique. The patient then received a boost to the seroma. This delivered an additional 10 Gy in 5 fractions using 12e electrons with a special teletherapy technique. The total dose was 60.4 Gy.  (6) tamoxifen started 02/28/2019  (7) hidradenitis/boils: on doxycycline    PLAN: Naomii is still recovering from her  cancer treatment.  Mental "fog" does tend to linger.  She also has significant neuropathy issues.  All this is keeping her from being able to get back to work.  We are continuing the gabapentin 3 times a day.  That seems to be helping.  It does make her feel tired of course.  She is tolerating tamoxifen well and the plan is to continue that a minimum of 5 years.  I am going to see her again in 6 months and then again 6 months after that.  Assuming all is well I will start yearly follow-up from that point  Total encounter time 25 minutes.Sarajane Jews C. Indianna Boran, MD  07/14/20 11:34 AM Medical Oncology and Hematology [facility] [place] [address] Tel. [phone]    Fax. [phone]   I, Wilburn Mylar, am acting as scribe for Dr. Virgie Dad. Nakeita Styles.  I, Lurline Del MD, have reviewed the above documentation for accuracy and completeness, and I agree with the above.   *Total Encounter Time as defined by the Centers for Medicare and Medicaid Services includes, in addition to the face-to-face time of a patient visit (documented in the note above) non-face-to-face time: obtaining and reviewing outside history, ordering and reviewing medications, tests or procedures, care coordination (communications with other health care professionals or caregivers) and documentation in the medical record.

## 2020-07-13 NOTE — Telephone Encounter (Signed)
Spoke to patient regarding no show today and she reported that she thought her appointment was Wednesday. Confirmed next appointment which she needed to cancel due to a procedure. She does plan to attend her appointments next week.

## 2020-07-14 ENCOUNTER — Inpatient Hospital Stay: Payer: 59 | Attending: Oncology | Admitting: Oncology

## 2020-07-14 ENCOUNTER — Other Ambulatory Visit: Payer: Self-pay

## 2020-07-14 ENCOUNTER — Encounter (HOSPITAL_BASED_OUTPATIENT_CLINIC_OR_DEPARTMENT_OTHER)
Admission: RE | Admit: 2020-07-14 | Discharge: 2020-07-14 | Disposition: A | Discharge status: Home / Self Care | Payer: 59 | Source: Ambulatory Visit | Attending: Surgery | Admitting: Surgery

## 2020-07-14 ENCOUNTER — Encounter (HOSPITAL_BASED_OUTPATIENT_CLINIC_OR_DEPARTMENT_OTHER): Payer: Self-pay | Admitting: Surgery

## 2020-07-14 ENCOUNTER — Inpatient Hospital Stay: Payer: 59

## 2020-07-14 VITALS — BP 169/83 | HR 75 | Temp 97.2°F | Resp 18 | Ht 65.0 in | Wt 236.6 lb

## 2020-07-14 DIAGNOSIS — E1122 Type 2 diabetes mellitus with diabetic chronic kidney disease: Secondary | ICD-10-CM

## 2020-07-14 DIAGNOSIS — C50211 Malignant neoplasm of upper-inner quadrant of right female breast: Secondary | ICD-10-CM

## 2020-07-14 DIAGNOSIS — C73 Malignant neoplasm of thyroid gland: Secondary | ICD-10-CM

## 2020-07-14 DIAGNOSIS — D869 Sarcoidosis, unspecified: Secondary | ICD-10-CM

## 2020-07-14 DIAGNOSIS — Z87891 Personal history of nicotine dependence: Secondary | ICD-10-CM | POA: Diagnosis not present

## 2020-07-14 DIAGNOSIS — E119 Type 2 diabetes mellitus without complications: Secondary | ICD-10-CM | POA: Diagnosis not present

## 2020-07-14 DIAGNOSIS — Z923 Personal history of irradiation: Secondary | ICD-10-CM | POA: Diagnosis not present

## 2020-07-14 DIAGNOSIS — G629 Polyneuropathy, unspecified: Secondary | ICD-10-CM | POA: Diagnosis not present

## 2020-07-14 DIAGNOSIS — Z170 Estrogen receptor positive status [ER+]: Secondary | ICD-10-CM

## 2020-07-14 DIAGNOSIS — Z9221 Personal history of antineoplastic chemotherapy: Secondary | ICD-10-CM | POA: Diagnosis not present

## 2020-07-14 DIAGNOSIS — C50311 Malignant neoplasm of lower-inner quadrant of right female breast: Secondary | ICD-10-CM | POA: Diagnosis not present

## 2020-07-14 DIAGNOSIS — N183 Chronic kidney disease, stage 3 unspecified: Secondary | ICD-10-CM

## 2020-07-14 LAB — COMPREHENSIVE METABOLIC PANEL
ALT: 26 U/L (ref 0–44)
AST: 29 U/L (ref 15–41)
Albumin: 3.1 g/dL — ABNORMAL LOW (ref 3.5–5.0)
Alkaline Phosphatase: 88 U/L (ref 38–126)
Anion gap: 6 (ref 5–15)
BUN: 18 mg/dL (ref 6–20)
CO2: 30 mmol/L (ref 22–32)
Calcium: 9 mg/dL (ref 8.9–10.3)
Chloride: 105 mmol/L (ref 98–111)
Creatinine, Ser: 1.73 mg/dL — ABNORMAL HIGH (ref 0.44–1.00)
GFR calc Af Amer: 39 mL/min — ABNORMAL LOW (ref >60)
GFR calc non Af Amer: 33 mL/min — ABNORMAL LOW (ref >60)
Glucose, Bld: 180 mg/dL — ABNORMAL HIGH (ref 70–99)
Potassium: 3.7 mmol/L (ref 3.5–5.1)
Sodium: 141 mmol/L (ref 135–145)
Total Bilirubin: 0.3 mg/dL (ref 0.3–1.2)
Total Protein: 7.5 g/dL (ref 6.5–8.1)

## 2020-07-14 LAB — CBC WITH DIFFERENTIAL/PLATELET
Abs Immature Granulocytes: 0.09 10*3/uL — ABNORMAL HIGH (ref 0.00–0.07)
Basophils Absolute: 0 10*3/uL (ref 0.0–0.1)
Basophils Relative: 0 %
Eosinophils Absolute: 0.1 10*3/uL (ref 0.0–0.5)
Eosinophils Relative: 2 %
HCT: 40.2 % (ref 36.0–46.0)
Hemoglobin: 12.9 g/dL (ref 12.0–15.0)
Immature Granulocytes: 1 %
Lymphocytes Relative: 27 %
Lymphs Abs: 1.9 10*3/uL (ref 0.7–4.0)
MCH: 31.2 pg (ref 26.0–34.0)
MCHC: 32.1 g/dL (ref 30.0–36.0)
MCV: 97.1 fL (ref 80.0–100.0)
Monocytes Absolute: 0.7 10*3/uL (ref 0.1–1.0)
Monocytes Relative: 9 %
Neutro Abs: 4.4 10*3/uL (ref 1.7–7.7)
Neutrophils Relative %: 61 %
Platelets: 204 10*3/uL (ref 150–400)
RBC: 4.14 MIL/uL (ref 3.87–5.11)
RDW: 15.9 % — ABNORMAL HIGH (ref 11.5–15.5)
WBC: 7.2 10*3/uL (ref 4.0–10.5)
nRBC: 0 % (ref 0.0–0.2)

## 2020-07-14 LAB — SARS CORONAVIRUS 2 (TAT 6-24 HRS): SARS Coronavirus 2: NEGATIVE

## 2020-07-14 NOTE — Progress Notes (Addendum)
      Enhanced Recovery after Surgery for Orthopedics Enhanced Recovery after Surgery is a protocol used to improve the stress on your body and your recovery after surgery.  Patient Instructions  . The night before surgery:  o No food after midnight. ONLY clear liquids after midnight  . The day of surgery (if you do NOT have diabetes):  o Drink ONE (1) Pre-Surgery Clear Ensure as directed.   o This drink was given to you during your hospital  pre-op appointment visit. o The pre-op nurse will instruct you on the time to drink the  Pre-Surgery Ensure depending on your surgery time. o Finish the drink at the designated time by the pre-op nurse.  o Nothing else to drink after completing the  Pre-Surgery Clear Ensure.  . The day of surgery (if you have diabetes): o Drink ONE (1) Gatorade 2 (G2) as directed. o This drink was given to you during your hospital  pre-op appointment visit.  o The pre-op nurse will instruct you on the time to drink the   Gatorade 2 (G2) depending on your surgery time. o Color of the Gatorade may vary. Red is not allowed. o Nothing else to drink after completing the  Gatorade 2 (G2).         If you have questions, please contact your surgeon's office. Surgical soap given with instructions, pt verbalized understanding.  

## 2020-07-15 ENCOUNTER — Telehealth: Payer: Self-pay | Admitting: Oncology

## 2020-07-15 NOTE — Telephone Encounter (Signed)
Pt already had 6 month follow up scheduled with [facility]. Changed provider to [facility] per 9/28 los. Left voicemail with appt date and time.

## 2020-07-16 ENCOUNTER — Ambulatory Visit: Payer: 59 | Admitting: Physical Therapy

## 2020-07-16 ENCOUNTER — Encounter (HOSPITAL_BASED_OUTPATIENT_CLINIC_OR_DEPARTMENT_OTHER)
Admission: RE | Disposition: A | Discharge status: Home / Self Care | Payer: Self-pay | Source: Home / Self Care | Attending: Surgery

## 2020-07-16 ENCOUNTER — Ambulatory Visit (HOSPITAL_BASED_OUTPATIENT_CLINIC_OR_DEPARTMENT_OTHER): Payer: 59 | Admitting: Anesthesiology

## 2020-07-16 ENCOUNTER — Encounter (HOSPITAL_BASED_OUTPATIENT_CLINIC_OR_DEPARTMENT_OTHER): Payer: Self-pay | Admitting: Surgery

## 2020-07-16 ENCOUNTER — Ambulatory Visit (HOSPITAL_BASED_OUTPATIENT_CLINIC_OR_DEPARTMENT_OTHER)
Admission: RE | Admit: 2020-07-16 | Discharge: 2020-07-16 | Disposition: A | Discharge status: Home / Self Care | Payer: 59 | Attending: Surgery | Admitting: Surgery

## 2020-07-16 ENCOUNTER — Other Ambulatory Visit: Payer: Self-pay

## 2020-07-16 DIAGNOSIS — Z452 Encounter for adjustment and management of vascular access device: Secondary | ICD-10-CM | POA: Diagnosis not present

## 2020-07-16 DIAGNOSIS — C50911 Malignant neoplasm of unspecified site of right female breast: Secondary | ICD-10-CM | POA: Diagnosis not present

## 2020-07-16 DIAGNOSIS — Z79899 Other long term (current) drug therapy: Secondary | ICD-10-CM | POA: Insufficient documentation

## 2020-07-16 DIAGNOSIS — Z888 Allergy status to other drugs, medicaments and biological substances status: Secondary | ICD-10-CM | POA: Diagnosis present

## 2020-07-16 DIAGNOSIS — Z170 Estrogen receptor positive status [ER+]: Secondary | ICD-10-CM | POA: Insufficient documentation

## 2020-07-16 HISTORY — PX: PORT-A-CATH REMOVAL: SHX5289

## 2020-07-16 LAB — GLUCOSE, CAPILLARY
Glucose-Capillary: 129 mg/dL — ABNORMAL HIGH (ref 70–99)
Glucose-Capillary: 140 mg/dL — ABNORMAL HIGH (ref 70–99)

## 2020-07-16 SURGERY — REMOVAL PORT-A-CATH
Anesthesia: Monitor Anesthesia Care

## 2020-07-16 MED ORDER — CEFAZOLIN SODIUM-DEXTROSE 2-4 GM/100ML-% IV SOLN
2.0000 g | INTRAVENOUS | Status: AC
  Administered 2020-07-16: 2 g via INTRAVENOUS

## 2020-07-16 MED ORDER — PROMETHAZINE HCL 25 MG/ML IJ SOLN: 6.2500 mg | INTRAMUSCULAR | Status: DC | PRN

## 2020-07-16 MED ORDER — CHLORHEXIDINE GLUCONATE CLOTH 2 % EX PADS: 6.0000 | MEDICATED_PAD | Freq: Once | CUTANEOUS | Status: DC

## 2020-07-16 MED ORDER — OXYCODONE HCL 5 MG PO TABS: 5.0000 mg | ORAL_TABLET | Freq: Once | ORAL | Status: DC | PRN

## 2020-07-16 MED ORDER — LIDOCAINE 2% (20 MG/ML) 5 ML SYRINGE
INTRAMUSCULAR | Status: DC | PRN
  Administered 2020-07-16: 40 mg via INTRAVENOUS

## 2020-07-16 MED ORDER — IBUPROFEN 800 MG PO TABS: 800.0000 mg | ORAL_TABLET | Freq: Three times a day (TID) | ORAL | 0 refills | Status: DC | PRN

## 2020-07-16 MED ORDER — LACTATED RINGERS IV SOLN: INTRAVENOUS | Status: DC

## 2020-07-16 MED ORDER — MIDAZOLAM HCL 2 MG/2ML IJ SOLN
INTRAMUSCULAR | Status: AC
  Filled 2020-07-16: qty 2

## 2020-07-16 MED ORDER — PROPOFOL 500 MG/50ML IV EMUL
INTRAVENOUS | Status: DC | PRN
  Administered 2020-07-16: 100 ug/kg/min via INTRAVENOUS

## 2020-07-16 MED ORDER — OXYCODONE HCL 5 MG/5ML PO SOLN: 5.0000 mg | Freq: Once | ORAL | Status: DC | PRN

## 2020-07-16 MED ORDER — ONDANSETRON HCL 4 MG/2ML IJ SOLN
INTRAMUSCULAR | Status: DC | PRN
  Administered 2020-07-16: 4 mg via INTRAVENOUS

## 2020-07-16 MED ORDER — MIDAZOLAM HCL 5 MG/5ML IJ SOLN
INTRAMUSCULAR | Status: DC | PRN
  Administered 2020-07-16: 2 mg via INTRAVENOUS

## 2020-07-16 MED ORDER — FENTANYL CITRATE (PF) 100 MCG/2ML IJ SOLN
INTRAMUSCULAR | Status: AC
  Filled 2020-07-16: qty 2

## 2020-07-16 MED ORDER — CEFAZOLIN SODIUM-DEXTROSE 2-4 GM/100ML-% IV SOLN
INTRAVENOUS | Status: AC
  Filled 2020-07-16: qty 100

## 2020-07-16 MED ORDER — ACETAMINOPHEN 10 MG/ML IV SOLN
1000.0000 mg | Freq: Once | INTRAVENOUS | Status: AC
  Administered 2020-07-16: 1000 mg via INTRAVENOUS

## 2020-07-16 MED ORDER — ACETAMINOPHEN 10 MG/ML IV SOLN
INTRAVENOUS | Status: AC
  Filled 2020-07-16: qty 100

## 2020-07-16 MED ORDER — HYDROMORPHONE HCL 1 MG/ML IJ SOLN: 0.2500 mg | INTRAMUSCULAR | Status: DC | PRN

## 2020-07-16 MED ORDER — LIDOCAINE 2% (20 MG/ML) 5 ML SYRINGE
INTRAMUSCULAR | Status: AC
  Filled 2020-07-16: qty 5

## 2020-07-16 MED ORDER — FENTANYL CITRATE (PF) 250 MCG/5ML IJ SOLN
INTRAMUSCULAR | Status: DC | PRN
Start: 2020-07-16 — End: 2020-07-16
  Administered 2020-07-16: 50 ug via INTRAVENOUS

## 2020-07-16 MED ORDER — BUPIVACAINE-EPINEPHRINE 0.25% -1:200000 IJ SOLN
INTRAMUSCULAR | Status: DC | PRN
  Administered 2020-07-16: 17 mL

## 2020-07-16 SURGICAL SUPPLY — 28 items
ADH SKN CLS APL DERMABOND .7 (GAUZE/BANDAGES/DRESSINGS) ×1
APL PRP STRL LF DISP 70% ISPRP (MISCELLANEOUS) ×1
BLADE SURG 15 STRL LF DISP TIS (BLADE) ×1 IMPLANT
BLADE SURG 15 STRL SS (BLADE) ×2
CHLORAPREP W/TINT 26 (MISCELLANEOUS) ×2 IMPLANT
COVER BACK TABLE 60X90IN (DRAPES) ×2 IMPLANT
COVER MAYO STAND STRL (DRAPES) ×2 IMPLANT
DECANTER SPIKE VIAL GLASS SM (MISCELLANEOUS) ×2 IMPLANT
DERMABOND ADVANCED (GAUZE/BANDAGES/DRESSINGS) ×1
DERMABOND ADVANCED .7 DNX12 (GAUZE/BANDAGES/DRESSINGS) ×1 IMPLANT
DRAPE LAPAROTOMY 100X72 PEDS (DRAPES) ×2 IMPLANT
DRAPE UTILITY XL STRL (DRAPES) ×2 IMPLANT
ELECT REM PT RETURN 9FT ADLT (ELECTROSURGICAL) ×2
ELECTRODE REM PT RTRN 9FT ADLT (ELECTROSURGICAL) ×1 IMPLANT
GLOVE BIOGEL PI IND STRL 8 (GLOVE) ×1 IMPLANT
GLOVE BIOGEL PI INDICATOR 8 (GLOVE) ×1
GLOVE ECLIPSE 8.0 STRL XLNG CF (GLOVE) ×2 IMPLANT
GOWN STRL REUS W/ TWL LRG LVL3 (GOWN DISPOSABLE) ×2 IMPLANT
GOWN STRL REUS W/TWL LRG LVL3 (GOWN DISPOSABLE) ×4
NEEDLE HYPO 25X1 1.5 SAFETY (NEEDLE) ×2 IMPLANT
NS IRRIG 1000ML POUR BTL (IV SOLUTION) ×2 IMPLANT
PACK BASIN DAY SURGERY FS (CUSTOM PROCEDURE TRAY) ×2 IMPLANT
PENCIL SMOKE EVACUATOR (MISCELLANEOUS) ×2 IMPLANT
SPONGE LAP 4X18 RFD (DISPOSABLE) ×2 IMPLANT
SUT MON AB 4-0 PC3 18 (SUTURE) ×2 IMPLANT
SUT VICRYL 3-0 CR8 SH (SUTURE) ×2 IMPLANT
SYR CONTROL 10ML LL (SYRINGE) ×2 IMPLANT
TOWEL GREEN STERILE FF (TOWEL DISPOSABLE) ×2 IMPLANT

## 2020-07-16 NOTE — Anesthesia Preprocedure Evaluation (Signed)
Anesthesia Evaluation  Patient identified by MRN, date of birth, ID band Patient awake    Reviewed: Allergy & Precautions, NPO status , Patient's Chart, lab work & pertinent test results  History of Anesthesia Complications Negative for: history of anesthetic complications  Airway Mallampati: II  TM Distance: >3 FB Neck ROM: Full    Dental  (+) Dental Advisory Given   Pulmonary asthma (inhaler last needed 6 months ago) , former smoker,    breath sounds clear to auscultation       Cardiovascular hypertension, Pt. on medications (-) angina Rhythm:Regular Rate:Normal  11/19 ECHO: EF 55-60%, mild MR   Neuro/Psych Anxiety Depression negative neurological ROS     GI/Hepatic Neg liver ROS, GERD  ,  Endo/Other  diabetes, Oral Hypoglycemic AgentsHypothyroidism   Renal/GU Renal InsufficiencyRenal disease     Musculoskeletal  (+) Arthritis , Osteoarthritis,    Abdominal (+) + obese,   Peds  Hematology  (+) Blood dyscrasia (Hb 8.8), anemia , HIV,   Anesthesia Other Findings Breast cancer  Reproductive/Obstetrics                             Anesthesia Physical  Anesthesia Plan  ASA: III  Anesthesia Plan: MAC   Post-op Pain Management:    Induction: Intravenous  PONV Risk Score and Plan: 2 and Ondansetron, Midazolam and Treatment may vary due to age or medical condition  Airway Management Planned: Simple Face Mask  Additional Equipment:   Intra-op Plan:   Post-operative Plan:   Informed Consent: I have reviewed the patients History and Physical, chart, labs and discussed the procedure including the risks, benefits and alternatives for the proposed anesthesia with the patient or authorized representative who has indicated his/her understanding and acceptance.     Dental advisory given  Plan Discussed with: CRNA and Surgeon  Anesthesia Plan Comments:         Anesthesia Quick  Evaluation

## 2020-07-16 NOTE — Discharge Instructions (Signed)
GENERAL SURGERY: POST OP INSTRUCTIONS  ######################################################################  EAT Gradually transition to a high fiber diet with a fiber supplement over the next few weeks after discharge.  Start with a pureed / full liquid diet (see below)  WALK Walk an hour a day.  Control your pain to do that.    CONTROL PAIN Control pain so that you can walk, sleep, tolerate sneezing/coughing, go up/down stairs.  HAVE A BOWEL MOVEMENT DAILY Keep your bowels regular to avoid problems.  OK to try a laxative to override constipation.  OK to use an antidairrheal to slow down diarrhea.  Call if not better after 2 tries  CALL IF YOU HAVE PROBLEMS/CONCERNS Call if you are still struggling despite following these instructions. Call if you have concerns not answered by these instructions  ######################################################################    1. DIET: Follow a light bland diet & liquids the first 24 hours after arrival home, such as soup, liquids, starches, etc.  Be sure to drink plenty of fluids.  Quickly advance to a usual solid diet within a few days.  Avoid fast food or heavy meals as your are more likely to get nauseated or have irregular bowels.  A low-fat, high-fiber diet for the rest of your life is ideal.   2. Take your usually prescribed home medications unless otherwise directed. 3. PAIN CONTROL: a. Pain is best controlled by a usual combination of three different methods TOGETHER: i. Ice/Heat ii. Over the counter pain medication iii. Prescription pain medication b. Most patients will experience some swelling and bruising around the incisions.  Ice packs or heating pads (30-60 minutes up to 6 times a day) will help. Use ice for the first few days to help decrease swelling and bruising, then switch to heat to help relax tight/sore spots and speed recovery.  Some people prefer to use ice alone, heat alone, alternating between ice & heat.   Experiment to what works for you.  Swelling and bruising can take several weeks to resolve.   c. It is helpful to take an over-the-counter pain medication regularly for the first few weeks.  Choose one of the following that works best for you: i. Naproxen (Aleve, etc)  Two 220mg tabs twice a day ii. Ibuprofen (Advil, etc) Three 200mg tabs four times a day (every meal & bedtime) iii. Acetaminophen (Tylenol, etc) 500-650mg four times a day (every meal & bedtime) d. A  prescription for pain medication (such as oxycodone, hydrocodone, etc) should be given to you upon discharge.  Take your pain medication as prescribed.  i. If you are having problems/concerns with the prescription medicine (does not control pain, nausea, vomiting, rash, itching, etc), please call us [phone] to see if we need to switch you to a different pain medicine that will work better for you and/or control your side effect better. ii. If you need a refill on your pain medication, please contact your pharmacy.  They will contact our office to request authorization. Prescriptions will not be filled after 5 pm or on week-ends. 4. Avoid getting constipated.  Between the surgery and the pain medications, it is common to experience some constipation.  Increasing fluid intake and taking a fiber supplement (such as Metamucil, Citrucel, FiberCon, MiraLax, etc) 1-2 times a day regularly will usually help prevent this problem from occurring.  A mild laxative (prune juice, Milk of Magnesia, MiraLax, etc) should be taken according to package directions if there are no bowel movements after 48 hours.   5. Wash /   shower every day.  You may shower over the dressings as they are waterproof.  Continue to shower over incision(s) after the dressing is off. 6. Remove your waterproof bandages 5 days after surgery.  You may leave the incision open to air.  You may have skin tapes (Steri Strips) covering the incision(s).  Leave them on until one week, then  remove.  You may replace a dressing/Band-Aid to cover the incision for comfort if you wish.      7. ACTIVITIES as tolerated:   a. You may resume regular (light) daily activities beginning the next day--such as daily self-care, walking, climbing stairs--gradually increasing activities as tolerated.  If you can walk 30 minutes without difficulty, it is safe to try more intense activity such as jogging, treadmill, bicycling, low-impact aerobics, swimming, etc. b. Save the most intensive and strenuous activity for last such as sit-ups, heavy lifting, contact sports, etc  Refrain from any heavy lifting or straining until you are off narcotics for pain control.   c. DO NOT PUSH THROUGH PAIN.  Let pain be your guide: If it hurts to do something, don't do it.  Pain is your body warning you to avoid that activity for another week until the pain goes down. d. You may drive when you are no longer taking prescription pain medication, you can comfortably wear a seatbelt, and you can safely maneuver your car and apply brakes. e. Dennis Bast may have sexual intercourse when it is comfortable.  8. FOLLOW UP in our office a. Please call [facility] at [phone] to set up an appointment to see your surgeon in the office for a follow-up appointment approximately 2-3 weeks after your surgery. b. Make sure that you call for this appointment the day you arrive home to insure a convenient appointment time. 9. IF YOU HAVE DISABILITY OR FAMILY LEAVE FORMS, BRING THEM TO THE OFFICE FOR PROCESSING.  DO NOT GIVE THEM TO YOUR DOCTOR.   WHEN TO CALL us [phone]: 1. Poor pain control 2. Reactions / problems with new medications (rash/itching, nausea, etc)  3. Fever over 101.5 F (38.5 C) 4. Worsening swelling or bruising 5. Continued bleeding from incision. 6. Increased pain, redness, or drainage from the incision 7. Difficulty breathing / swallowing   The clinic staff is available to answer your questions during regular  business hours (8:30am-5pm).  Please don't hesitate to call and ask to speak to one of our nurses for clinical concerns.   If you have a medical emergency, go to the nearest emergency room or call 911.  A surgeon from [facility] is always on call at the [facility], [place], [place], [place], [place]  [id number] ? MAIN: [phone] ? TOLL FREE: [phone] ?  FAX [phone] [email or web address]    Post Anesthesia Home Care Instructions  Activity: Get plenty of rest for the remainder of the day. A responsible individual must stay with you for 24 hours following the procedure.  For the next 24 hours, DO NOT: -Drive a car -Paediatric nurse -Drink alcoholic beverages -Take any medication unless instructed by your physician -Make any legal decisions or sign important papers.  Meals: Start with liquid foods such as gelatin or soup. Progress to regular foods as tolerated. Avoid greasy, spicy, heavy foods. If nausea and/or vomiting occur, drink only clear liquids until the nausea and/or vomiting subsides. Call your physician if vomiting continues.  Special Instructions/Symptoms: Your throat may feel dry or  sore from the anesthesia or the breathing tube placed in your throat during surgery. If this causes discomfort, gargle with warm salt water. The discomfort should disappear within 24 hours.  May take Tylenol after 6:30pm, if needed.

## 2020-07-16 NOTE — Transfer of Care (Signed)
Immediate Anesthesia Transfer of Care Note  Patient: [name]  Procedure(s) Performed: REMOVAL PORT-A-CATH (N/A )  Patient Location: PACU  Anesthesia Type:MAC  Level of Consciousness: awake, alert , oriented and patient cooperative  Airway & Oxygen Therapy: Patient Spontanous Breathing and Patient connected to nasal cannula oxygen  Post-op Assessment: Report given to RN, Post -op Vital signs reviewed and stable and Patient moving all extremities  Post vital signs: Reviewed and stable  Last Vitals:  Vitals Value Taken Time  BP 165/100 07/16/20 1215  Temp    Pulse 80 07/16/20 1216  Resp 18 07/16/20 1216  SpO2 100 % 07/16/20 1216  Vitals shown include unvalidated device data.  Last Pain:  Vitals:   07/16/20 1026  TempSrc: Oral  PainSc: 0-No pain      Patients Stated Pain Goal: 4 (84/53/64 6803)  Complications: No complications documented.

## 2020-07-16 NOTE — Anesthesia Postprocedure Evaluation (Signed)
Anesthesia Post Note  Patient: [name]  Procedure(s) Performed: REMOVAL PORT-A-CATH (N/A )     Patient location during evaluation: PACU Anesthesia Type: MAC Level of consciousness: awake and alert Pain management: pain level controlled Vital Signs Assessment: post-procedure vital signs reviewed and stable Respiratory status: spontaneous breathing, nonlabored ventilation, respiratory function stable and patient connected to nasal cannula oxygen Cardiovascular status: stable and blood pressure returned to baseline Postop Assessment: no apparent nausea or vomiting Anesthetic complications: no   No complications documented.  Last Vitals:  Vitals:   07/16/20 1242 07/16/20 1245  BP: (!) 172/87 (!) 171/91  Pulse: 71 71  Resp: 20 (!) 21  Temp:    SpO2: 98% 99%    Last Pain:  Vitals:   07/16/20 1242  TempSrc:   PainSc: Asleep                 Lillith Mcneff

## 2020-07-16 NOTE — Op Note (Signed)
Preop diagnosis: Indwelling port a catheter for chemotherapy left   Postop diagnosis: Same  Procedure: Removal of port a catheter  Surgeon: Erroll Luna M.D.  Anesthesia: MAC with local  EBL: Minimal  Specimen none  Drains: None  Indications for procedure: The patient presents for removal of port a catheter after completing chemotherapy. The patient no longer requires central venous access. Risks of bleeding, infection, catheter fragmentation, embolization, arrhythmias and damage to arteries, veins and nerves and possibly other mediastinal structures discussed. The patient agrees to proceed.  Description of procedure: The patient was seen in the holding area. Questions were answered. The patient agreed to proceed. The patient was taken to the operating room. The patient was placed supine. Anesthesia was initiated. The skin on the upper  Left chest was prepped and draped in a sterile fashion. Timeout was done. The patient received preoperative antibiotics. Incision was made through the old port site and the hub of the Port-A-Cath was seen. The sutures were cut to release the port from the chest wall. The catheter was removed in its entirety without difficulty. The tract was closed with 3-0 Vicryl. 4 Monocryl was used to close the skin. All final counts were correct. The patient was taken to recovery in satisfactory condition.

## 2020-07-16 NOTE — Interval H&P Note (Signed)
History and Physical Interval Note:  07/16/2020 11:23 AM  [name]  has presented today for surgery, with the diagnosis of PORT IN PLACE.  The various methods of treatment have been discussed with the patient and family. After consideration of risks, benefits and other options for treatment, the patient has consented to  Procedure(s): REMOVAL PORT-A-CATH (N/A) as a surgical intervention.  The patient's history has been reviewed, patient examined, no change in status, stable for surgery.  I have reviewed the patient's chart and labs.  Questions were answered to the patient's satisfaction.     Turner Daniels MD

## 2020-07-20 ENCOUNTER — Encounter (HOSPITAL_BASED_OUTPATIENT_CLINIC_OR_DEPARTMENT_OTHER): Payer: Self-pay | Admitting: Surgery

## 2020-07-21 ENCOUNTER — Telehealth: Payer: Self-pay

## 2020-07-21 ENCOUNTER — Ambulatory Visit: Payer: 59 | Admitting: Physical Therapy

## 2020-07-21 ENCOUNTER — Ambulatory Visit (INDEPENDENT_AMBULATORY_CARE_PROVIDER_SITE_OTHER): Payer: 59 | Admitting: Endocrinology

## 2020-07-21 ENCOUNTER — Encounter: Payer: Self-pay | Admitting: Endocrinology

## 2020-07-21 ENCOUNTER — Other Ambulatory Visit: Payer: Self-pay

## 2020-07-21 VITALS — BP 108/80 | HR 74 | Ht 65.0 in | Wt 235.4 lb

## 2020-07-21 DIAGNOSIS — R221 Localized swelling, mass and lump, neck: Secondary | ICD-10-CM

## 2020-07-21 DIAGNOSIS — E890 Postprocedural hypothyroidism: Secondary | ICD-10-CM | POA: Diagnosis not present

## 2020-07-21 LAB — T4, FREE: Free T4: 1.05 ng/dL (ref 0.60–1.60)

## 2020-07-21 LAB — TSH: TSH: 6.46 u[IU]/mL — ABNORMAL HIGH (ref 0.35–4.50)

## 2020-07-21 MED ORDER — SYNTHROID 112 MCG PO TABS: 224.0000 ug | ORAL_TABLET | Freq: Every day | ORAL | 5 refills | Status: DC

## 2020-07-21 NOTE — Patient Instructions (Addendum)
Blood tests are requested for you today.  We'll let you know about the results.  Based on the results, i'll prescribe for you the brand name thyroid pill.   It is best to never miss the medication.  However, if you do miss it, next best is to double up the next time.

## 2020-07-21 NOTE — Progress Notes (Signed)
Subjective:    Patient ID: [name], female    DOB: August 03, 1966, 54 y.o.   MRN: [id number]  HPI Pt is ref by Webb Silversmith, NP, for postsurgical hypothyroidism.  2007 thyr NM scan consistent with Graves disease, except for 1 cold area 2008 Dominant 2.5 cm solid nodule in the inferior right lobe, corresponding to cold defect on NM. 2008 thyroid bx was suspicious 2009 thyroidect--pathol c/w Grave's dz, with adenomatous nodule 2/18 CT Multiple enlarged bilateral cervical lymph nodes. Residual 10 mm nodule at the thyroidectomy bed with similar nodules superior to the thyroid on the left, concerning for residual or recurrent thyroid tissue. Neoplasm is not excluded.  Prominent lymphoid tissue in the nasopharynx and oropharynx. Single low-density noted in the posterior left level 2 station of undetermined significance. 3/18 LYMPH NODE , FNA, LEFT CERVICAL, STRONGLY FAVORS METASTATIC PTC 3/18 THYROID, FINE NEEDLE ASPIRATION, LEFT [place] CATEGORY II 5/18 NM scan: Expected tissue within the thyroid bed. There is a second adjacent smaller focus of less intense uptake. 6/18 left thyroid lobectomy, and LN's, Left zone 3 Neck Contents.  NO MALIGNANCY 8/18 NM scan: abnormal focus in the right side of neck, suspicious for nodal metastasis 9/18 [place]: 1 non pathologically enlarged lymph node She takes 200 mcg/d.  She has insomnia, muscle cramps, difficulty with concentration, and dry skin.  She requests brand name.   Past Medical History:  Diagnosis Date  . Allergy   . Anemia    Normocytic  . Anxiety   . Asthma   . Blood dyscrasia   . Bronchitis 2005  . 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 Diabetes mellitus without complication ([place])   . Family history of breast cancer   . Family history of lung cancer   . Family history of prostate cancer   . Gastroenteritis 07/10/07  . GERD 07/24/2006  . GRAVE'S DISEASE 01/01/2008  . History of hidradenitis suppurativa   . History of kidney  stones   . History of thrush   . HIV DISEASE 07/24/2006   dx March 05  . Hyperlipidemia   . HYPERTENSION 07/24/2006  . Hyperthyroidism 08/2006   Grave's Disease -diffuse radiotracer uptake 08/25/06 Thyroid scan-Cold nodule to R lower lobe of thyrorid  . Menometrorrhagia    hx of  . Nephrolithiasis   . 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 Personal history of chemotherapy    2020  . Personal history of radiation therapy    2020  . Pneumonia 2005  . 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 Sarcoidosis 02/08/2007   dx as a teenager in [place] from abnl CXR. Completed 2 yrs Prednisone after lung bx confirmation. No symptoms since then.  . Suppurative hidradenitis   . Thyroid cancer ([place])   . THYROID NODULE, RIGHT 02/08/2007    Past Surgical History:  Procedure Laterality Date  . BREAST EXCISIONAL BIOPSY Right 04/26/2018   right axilla negative  . BREAST EXCISIONAL BIOPSY Left 04/26/2018   left axilla negative  . BREAST LUMPECTOMY Right 10/03/2018   malignant  . BREAST LUMPECTOMY WITH RADIOACTIVE SEED AND SENTINEL LYMPH NODE BIOPSY Right 10/03/2018   Procedure: RIGHT BREAST LUMPECTOMY WITH RADIOACTIVE SEED AND SENTINEL LYMPH NODE MAPPING;  Surgeon: Erroll Luna, MD;  Location: [facility];  Service: General;  Laterality: Right;  . BREAST SURGERY  1997   Breast Reduction   . CYSTOSCOPY W/ URETERAL STENT REMOVAL  11/09/2012   Procedure: CYSTOSCOPY WITH STENT REMOVAL;  Surgeon: Alexis Frock, MD;  Location: [facility];  Service: Urology;  Laterality: Right;  . CYSTOSCOPY WITH RETROGRADE PYELOGRAM, URETEROSCOPY AND STENT PLACEMENT  11/09/2012   Procedure: CYSTOSCOPY WITH RETROGRADE PYELOGRAM, URETEROSCOPY AND STENT PLACEMENT;  Surgeon: Alexis Frock, MD;  Location: [facility];  Service: Urology;  Laterality: Left;  LEFT URETEROSCOPY, STONE MANIPULATION, left STENT exchange   . CYSTOSCOPY WITH STENT PLACEMENT  10/02/2012    Procedure: CYSTOSCOPY WITH STENT PLACEMENT;  Surgeon: Alexis Frock, MD;  Location: [facility];  Service: Urology;  Laterality: Left;  . DILATION AND CURETTAGE OF UTERUS  Feb 2004   s/p for 1st trimester nonviable pregnancy  . EYE SURGERY     sty under eyelid  . INCISE AND DRAIN ABCESS  Nov 03   s/p I &D for righ inframmary fold hidradenitis  . INCISION AND DRAINAGE PERITONSILLAR ABSCESS  Mar 03  . IR CV LINE INJECTION  06/07/2018  . IR IMAGING GUIDED PORT INSERTION  06/20/2018  . IR REMOVAL TUN ACCESS W/ PORT W/O FL MOD SED  06/20/2018  . IRRIGATION AND DEBRIDEMENT ABSCESS  01/31/2012   Procedure: IRRIGATION AND DEBRIDEMENT ABSCESS;  Surgeon: Shann Medal, MD;  Location: [facility];  Service: General;  Laterality: Right;  right breast and axilla   . NEPHROLITHOTOMY  10/02/2012   Procedure: NEPHROLITHOTOMY PERCUTANEOUS;  Surgeon: Alexis Frock, MD;  Location: [facility];  Service: Urology;  Laterality: Right;  First Stage Percutaneous Nephrolithotomy with Surgeon Access, Left Ureteral Stent    . NEPHROLITHOTOMY  10/04/2012   Procedure: NEPHROLITHOTOMY PERCUTANEOUS SECOND LOOK;  Surgeon: Alexis Frock, MD;  Location: [facility];  Service: Urology;  Laterality: Right;     . NEPHROLITHOTOMY  10/08/2012   Procedure: NEPHROLITHOTOMY PERCUTANEOUS;  Surgeon: Alexis Frock, MD;  Location[facility];  Service: Urology;  Laterality: Right;  THIRD STAGE, nephrostomy tube exchange x 2  . NEPHROLITHOTOMY  10/11/2012   Procedure: NEPHROLITHOTOMY PERCUTANEOUS SECOND LOOK;  Surgeon: Alexis Frock, MD;  Location: [facility];  Service: Urology;  Laterality: Right;  RIGHT 4 STAGE PERCUTANOUS NEPHROLITHOTOMY, right URETEROSCOPY WITH HOLMIUM LASER   . PORT-A-CATH REMOVAL N/A 07/16/2020   Procedure: REMOVAL PORT-A-CATH;  Surgeon: Erroll Luna, MD;  Location: [facility];  Service: General;  Laterality: N/A;  . PORTACATH PLACEMENT Left 05/17/2018   Procedure: INSERTION PORT-A-CATH;  Surgeon: Coralie Keens, MD;   Location: [facility];  Service: General;  Laterality: Left;  . RADICAL NECK DISSECTION  04/12/2017   limited/notes 04/12/2017  . RADICAL NECK DISSECTION N/A 04/12/2017   Procedure: RADICAL NECK DISSECTION;  Surgeon: Melida Quitter, MD;  Location: [facility];  Service: ENT;  Laterality: N/A;  limited neck dissection 2 hours total  . REDUCTION MAMMAPLASTY Bilateral 1998  . RIGHT/LEFT HEART CATH AND CORONARY ANGIOGRAPHY N/A 03/12/2020   Procedure: RIGHT/LEFT HEART CATH AND CORONARY ANGIOGRAPHY;  Surgeon: Jolaine Artist, MD;  Location: [facility];  Service: Cardiovascular;  Laterality: N/A;  . [place]  1994  . THYROIDECTOMY  04/12/2017   completion/notes 04/12/2017  . THYROIDECTOMY N/A 04/12/2017   Procedure: THYROIDECTOMY;  Surgeon: Melida Quitter, MD;  Location: [facility];  Service: ENT;  Laterality: N/A;  Completion Thyroidectomy  . TOTAL THYROIDECTOMY  2010    Social History   Socioeconomic History  . Marital status: Single    Spouse name: Not on file  . Number of children: 1  . Years of education: Not on file  . Highest education level: Not on file  Occupational History  . Occupation: Secondary school teacher  Tobacco  Use  . Smoking status: Former Smoker    Packs/day: 0.50    Years: 15.00    Pack years: 7.50    Types: Cigarettes    Start date: 04/12/2017  . Smokeless tobacco: Never Used  . Tobacco comment: quit smoking  may 2018  Vaping Use  . Vaping Use: Never used  Substance and Sexual Activity  . Alcohol use: Yes    Alcohol/week: 0.0 standard drinks    Comment: social  . Drug use: No  . Sexual activity: Not Currently    Birth control/protection: Post-menopausal    Comment: declined condoms  Other Topics Concern  . Not on file  Social History Narrative  . Not on file   Social Determinants of Health   Financial Resource Strain:   . Difficulty of Paying Living Expenses: Not on file  Food Insecurity:   . Worried About Charity fundraiser in the Last Year: Not  on file  . Ran Out of Food in the Last Year: Not on file  Transportation Needs:   . Lack of Transportation (Medical): Not on file  . Lack of Transportation (Non-Medical): Not on file  Physical Activity:   . Days of Exercise per Week: Not on file  . Minutes of Exercise per Session: Not on file  Stress:   . Feeling of Stress : Not on file  Social Connections:   . Frequency of Communication with Friends and Family: Not on file  . Frequency of Social Gatherings with Friends and Family: Not on file  . Attends Religious Services: Not on file  . Active Member of Clubs or Organizations: Not on file  . Attends Archivist Meetings: Not on file  . Marital Status: Not on file  Intimate Partner Violence:   . Fear of Current or Ex-Partner: Not on file  . Emotionally Abused: Not on file  . Physically Abused: Not on file  . Sexually Abused: Not on file    Current Outpatient Medications on File Prior to Visit  Medication Sig Dispense Refill  . Blood Glucose Monitoring Suppl (ONETOUCH VERIO) w/Device KIT USE AS INSTRUCTED TO TEST BLOOD SUGAR UP TO 4 TIMES DAILY E11.9 1 kit 0  . DESCOVY 200-25 MG tablet TAKE 1 TABLET BY MOUTH DAILY 30 tablet 2  . doxycycline (VIBRA-TABS) 100 MG tablet doxycycline hyclate 100 mg tablet    . empagliflozin (JARDIANCE) 10 MG TABS tablet Take 1 tablet (10 mg total) by mouth daily before breakfast. 30 tablet 6  . FLOVENT HFA 110 MCG/ACT inhaler TAKE 1 PUFF BY MOUTH TWICE A DAY (Patient taking differently: Inhale 1 puff into the lungs daily as needed (Shortness of breath). ) 36 Inhaler 1  . gabapentin (NEURONTIN) 300 MG capsule Take 1 capsule (300 mg total) by mouth 3 (three) times daily. (Patient taking differently: Take 300 mg by mouth 2 (two) times daily. ) 90 capsule 3  . glipiZIDE (GLUCOTROL) 5 MG tablet TAKE 1 TABLET (5 MG TOTAL) BY MOUTH 2 (TWO) TIMES DAILY BEFORE A MEAL. FOR DIABETES. (Patient taking differently: Take 5 mg by mouth daily. ) 180 tablet 1  .  glucose blood (ONETOUCH VERIO) test strip USE AS INSTRUCTED TO TEST BLOOD SUGAR DAILY 100 each 2  . irbesartan-hydrochlorothiazide (AVALIDE) 150-12.5 MG tablet TAKE 1 TABLET BY MOUTH EVERY DAY FOR BLOOD PRESSURE 90 tablet 2  . Lancets (ONETOUCH ULTRASOFT) lancets Use as instructed to test blood sugar daily 100 each 5  . metoprolol succinate (TOPROL-XL) 50 MG 24 hr  tablet TAKE 1 TABLET BY MOUTH EVERY DAY WITH OR IMMEDIATELY FOLLOWING A MEAL 90 tablet 0  . predniSONE (DELTASONE) 20 MG tablet Take 2 tablets daily for three days, then 1 tablet daily for three days for gout flare. 9 tablet 0  . rosuvastatin (CRESTOR) 10 MG tablet TAKE 1 TABLET BY MOUTH EVERY DAY FOR CHOLESTEROL (Patient taking differently: Take 10 mg by mouth daily. FOR CHOLESTEROL) 90 tablet 3  . spironolactone (ALDACTONE) 25 MG tablet TAKE 1 TABLET BY MOUTH EVERY DAY 90 tablet 3  . tamoxifen (NOLVADEX) 20 MG tablet Take 1 tablet (20 mg total) by mouth daily. 90 tablet 1  . [name] 50 MG tablet TAKE 1 TABLET(50 MG) BY MOUTH DAILY 30 tablet 2  . TRULICITY 5.17 GY/1.7CB SOPN INJECT CONTENTS OF 1 PEN (0.$Remove'75MG'ZRAWFoP$ ) INTO THE SKIN ONCE WEEKLY FOR DIABETES 1.5 mL 3  . [DISCONTINUED] prochlorperazine (COMPAZINE) 10 MG tablet TAKE 1 TABLET BY MOUTH EVERY 6 HOURS AS NEEDED FOR NAUSEA OR VOMITING 30 tablet 2   Current Facility-Administered Medications on File Prior to Visit  Medication Dose Route Frequency Provider Last Rate Last Admin  . sodium chloride flush (NS) 0.9 % injection 10 mL  10 mL Intracatheter PRN Magrinat, Virgie Dad, MD   10 mL at 09/19/18 1551    Allergies  Allergen Reactions  . Genvoya [Elviteg-Cobic-Emtricit-Tenofaf] Hives  . Lisinopril Cough  . Valsartan Other (See Comments) and Cough    Pt states she tolerates medicine now    Family History  Problem Relation Age of Onset  . Hypertension Mother   . Cancer Mother        laryngeal  . Heart disease Mother        stent  . Hypertension Father   . Lung cancer Father 48        hx smoking  . Heart disease Other   . Hypertension Other   . Stroke Other        Grandparent  . Kidney disease Other        Grandparent  . Diabetes Other        FH of Diabetes  . Hypertension Sister   . Cancer Maternal Uncle        Lung CA  . Hypertension Brother   . Hypertension Sister   . Breast cancer Maternal Aunt 65  . Breast cancer Paternal Aunt 76  . Prostate cancer Paternal Uncle   . Breast cancer Maternal Aunt        dx 60+  . Breast cancer Paternal Aunt        dx 45's  . Breast cancer Paternal Aunt        dx 79's  . Prostate cancer Paternal Uncle   . Lung cancer Paternal Uncle   . Breast cancer Cousin 37  . Breast cancer Cousin        dx <50  . Breast cancer Cousin        dx <50  . Breast cancer Cousin        dx <50  . Colon cancer Neg Hx   . Esophageal cancer Neg Hx   . Rectal cancer Neg Hx   . Stomach cancer Neg Hx     BP 108/80   Pulse 74   Ht $R'5\' 5"'ax$  (1.651 m)   Wt 235 lb 6.4 oz (106.8 kg)   LMP 03/31/2014   SpO2 97%   BMI 39.17 kg/m    Review of Systems denies depression, hair loss, weight gain, constipation,  and cold intolerance.      Objective:   Physical Exam VITAL SIGNS:  See vs page GENERAL: no distress EYES: slight bilat proptosis.  Neck: a healed scar is present.  I do not appreciate a nodule in the thyroid or elsewhere in the neck  Lab Results  Component Value Date   CALCIUM 9.0 07/14/2020   CAION 1.19 03/12/2020     Lab Results  Component Value Date   TSH 17.400 (H) 01/27/2020    I have reviewed outside records, and summarized: Pt was noted to have elevated TSH, and referred here.  In 2007 pt was first seen for ear infection, and was incidentally noted to have a thyroid nodule.      Assessment & Plan:  Postsurgical hypothyroidism, uncontrolled Complex h/o thyroid dz.  recheck [place]  Patient Instructions  Blood tests are requested for you today.  We'll let you know about the results.  Based on the results, i'll  prescribe for you the brand name thyroid pill.   It is best to never miss the medication.  However, if you do miss it, next best is to double up the next time.

## 2020-07-21 NOTE — Telephone Encounter (Signed)
called patient to reschedule her oct 7th appointment [facility] left message to give the office a call back.

## 2020-07-22 ENCOUNTER — Ambulatory Visit: Payer: 59 | Admitting: Cardiovascular Disease

## 2020-07-23 ENCOUNTER — Ambulatory Visit: Payer: 59 | Admitting: Cardiovascular Disease

## 2020-07-23 ENCOUNTER — Encounter: Payer: Self-pay | Admitting: Primary Care

## 2020-07-23 LAB — PTH, INTACT: PTH: 45

## 2020-07-24 ENCOUNTER — Ambulatory Visit: Payer: 59 | Admitting: Physical Therapy

## 2020-07-25 ENCOUNTER — Encounter: Payer: Self-pay | Admitting: Endocrinology

## 2020-07-27 ENCOUNTER — Ambulatory Visit: Payer: 59 | Admitting: Physical Therapy

## 2020-07-29 ENCOUNTER — Encounter: Payer: 59 | Admitting: Physical Therapy

## 2020-08-03 ENCOUNTER — Ambulatory Visit
Admission: RE | Admit: 2020-08-03 | Discharge: 2020-08-03 | Disposition: A | Discharge status: Home / Self Care | Payer: 59 | Source: Ambulatory Visit | Attending: Endocrinology | Admitting: Endocrinology

## 2020-08-03 DIAGNOSIS — R221 Localized swelling, mass and lump, neck: Secondary | ICD-10-CM

## 2020-08-04 ENCOUNTER — Encounter (HOSPITAL_COMMUNITY): Payer: 59

## 2020-08-10 ENCOUNTER — Other Ambulatory Visit: Payer: Self-pay

## 2020-08-10 ENCOUNTER — Ambulatory Visit (HOSPITAL_BASED_OUTPATIENT_CLINIC_OR_DEPARTMENT_OTHER): Payer: 59 | Attending: Internal Medicine | Admitting: Cardiology

## 2020-08-10 DIAGNOSIS — R0683 Snoring: Secondary | ICD-10-CM | POA: Diagnosis not present

## 2020-08-10 DIAGNOSIS — I493 Ventricular premature depolarization: Secondary | ICD-10-CM | POA: Insufficient documentation

## 2020-08-10 DIAGNOSIS — G4733 Obstructive sleep apnea (adult) (pediatric): Secondary | ICD-10-CM | POA: Insufficient documentation

## 2020-08-11 ENCOUNTER — Telehealth: Payer: Self-pay | Admitting: *Deleted

## 2020-08-11 ENCOUNTER — Other Ambulatory Visit (HOSPITAL_BASED_OUTPATIENT_CLINIC_OR_DEPARTMENT_OTHER): Payer: Self-pay

## 2020-08-11 DIAGNOSIS — G4733 Obstructive sleep apnea (adult) (pediatric): Secondary | ICD-10-CM

## 2020-08-11 DIAGNOSIS — R0683 Snoring: Secondary | ICD-10-CM

## 2020-08-11 NOTE — Telephone Encounter (Signed)
Informed patient of sleep study results and patient understanding was verbalized. Patient understands her sleep study showed they have sleep apnea and recommend CPAP titration. Please set up titration in the sleep lab.    CPAP titration PA submitted to [facility] via FAX     Patient was grateful for the call and thanked me

## 2020-08-11 NOTE — Telephone Encounter (Signed)
-----   Message from Sueanne Margarita, MD sent at 08/11/2020  2:38 PM EDT ----- Please let patient know that they have sleep apnea and recommend CPAP titration. Please set up titration in the sleep lab.

## 2020-08-11 NOTE — Procedures (Signed)
  Patient Name: [name], [name] Date: 08/10/2020 Gender: Female D.O.B: 10/29/1965 Age (years): 54 Referring Provider: Shaune Pascal Bensimhon Height (inches): 23 Interpreting Physician: Fransico Him MD, ABSM Weight (lbs): 237 RPSGT: Carolin Coy BMI: 39 MRN: [id number] Neck Size: 16.50  CLINICAL INFORMATION Sleep Study Type: NPSG  Indication for sleep study: Fatigue, Hypertension, Obesity, Snoring  Epworth Sleepiness Score: 6  Most recent polysomnogram dated 09/16/2019 revealed an AHI of 9.1/h.  SLEEP STUDY TECHNIQUE As per the AASM Manual for the Scoring of Sleep and Associated Events v2.3 (April 2016) with a hypopnea requiring 4% desaturations.  The channels recorded and monitored were frontal, central and occipital EEG, electrooculogram (EOG), submentalis EMG (chin), nasal and oral airflow, thoracic and abdominal wall motion, anterior tibialis EMG, snore microphone, electrocardiogram, and pulse oximetry.  MEDICATIONS Medications self-administered by patient taken the night of the study : N/A  SLEEP ARCHITECTURE The study was initiated at 10:24:10 PM and ended at 4:34:22 AM.  Sleep onset time was 22.6 minutes and the sleep efficiency was 82.8%. The total sleep time was 306.5 minutes.  Stage REM latency was 103.0 minutes.  The patient spent 10.0% of the night in stage N1 sleep, 72.3% in stage N2 sleep, 0.0%% in stage N3 and 17.8% in REM.  Alpha intrusion was absent.  Supine sleep was 0.49%.  RESPIRATORY PARAMETERS The overall apnea/hypopnea index (AHI) was 10.4 per hour. There were 0 total apneas, including 0 obstructive, 0 central and 0 mixed apneas. There were 53 hypopneas and 29 RERAs.  The AHI during Stage REM sleep was 34.1 per hour.  AHI while supine was 0.0 per hour.  The mean oxygen saturation was 92.0%. The minimum SpO2 during sleep was 87.0%.  moderate snoring was noted during this study.  CARDIAC DATA The 2 lead EKG demonstrated sinus rhythm. The  mean heart rate was 75.5 beats per minute. Other EKG findings include: PVCs. LEG MOVEMENT DATA The total PLMS were 0 with a resulting PLMS index of 0.0. Associated arousal with leg movement index was 0.0 .  IMPRESSIONS - Mild obstructive sleep apnea occurred during this study (AHI = 10.4/h) but severe during REM sleep (34.1/hr). - No significant central sleep apnea occurred during this study (CAI = 0.0/h). - Mild oxygen desaturation was noted during this study (Min O2 = 87.0%). - The patient snored with moderate snoring volume. - EKG findings include PVCs. - Clinically significant periodic limb movements did not occur during sleep. No significant associated arousals.  DIAGNOSIS - Obstructive Sleep Apnea (G47.33)  RECOMMENDATIONS - Therapeutic CPAP titration to determine optimal pressure required to alleviate sleep disordered breathing. - Avoid alcohol, sedatives and other CNS depressants that may worsen sleep apnea and disrupt normal sleep architecture. - Sleep hygiene should be reviewed to assess factors that may improve sleep quality. - Weight management and regular exercise should be initiated or continued if appropriate.  [Electronically signed] 08/11/2020 02:34 PM  Fransico Him MD, ABSM Diplomate, American Board of Sleep Medicine

## 2020-08-13 ENCOUNTER — Encounter: Payer: Self-pay | Admitting: Cardiovascular Disease

## 2020-08-13 ENCOUNTER — Other Ambulatory Visit: Payer: Self-pay

## 2020-08-13 ENCOUNTER — Ambulatory Visit (INDEPENDENT_AMBULATORY_CARE_PROVIDER_SITE_OTHER): Payer: 59 | Admitting: Cardiovascular Disease

## 2020-08-13 VITALS — BP 162/102 | HR 71 | Ht 65.0 in | Wt 234.0 lb

## 2020-08-13 DIAGNOSIS — I10 Essential (primary) hypertension: Secondary | ICD-10-CM

## 2020-08-13 DIAGNOSIS — J4530 Mild persistent asthma, uncomplicated: Secondary | ICD-10-CM | POA: Diagnosis not present

## 2020-08-13 DIAGNOSIS — E1122 Type 2 diabetes mellitus with diabetic chronic kidney disease: Secondary | ICD-10-CM

## 2020-08-13 DIAGNOSIS — N183 Chronic kidney disease, stage 3 unspecified: Secondary | ICD-10-CM

## 2020-08-13 MED ORDER — CARVEDILOL 25 MG PO TABS: 25.0000 mg | ORAL_TABLET | Freq: Two times a day (BID) | ORAL | 3 refills | Status: DC

## 2020-08-13 NOTE — Progress Notes (Signed)
Hypertension Clinic Initial Assessment:    Date:  08/13/2020   ID:  [name], DOB April 20, 1966, MRN [id number]  PCP:  [name], NP  Cardiologist:  No primary care provider on file.  Nephrologist:  Referring MD: [name], NP   CC: Hypertension  History of Present Illness:    [name] is a 54 y.o. female with a hx of hypertension, hyperlipidemia, coronary calcification with no CAD on cath, diabetes, Graves' disease, asthma, sarcoidosis, tobacco abuse, HIV here to establish care in the hypertension clinic.  She was diagnosed with hypertension in around 2009.  He previously was well-controlled but she is struggled more lately.  [name] sees Dr. Haroldine Laws for cardio-oncology and EF monitoring.  Her breast cancer was diagnosed in 04/2018.  She underwent chemotherapy with carboplatin, docetaxel, trastuzumab, and Pertuzumab.  She only had 2 cycles of Pertuzumab due to diarrhea.  She continued on 6 months of trastuzumab and had reduction in her LVEF, though thought to be clinically stable per Dr. Haroldine Laws.  She had a lumpectomy 09/2018 and completed XRT on 02/2019.  She finished Herceptin on 10/2019.  She saw Dr. Haroldine Laws 06/2019 with increased exertional dyspnea thought to be due to volume overload.  She was started on spironolactone and [place].  She was unable to afford the [place].  She last saw Dr. Haroldine Laws on 06/2020 and her blood pressure was poorly controlled.  She was referred for sleep study and to the advanced hypertension clinic.  She was diagnosed with sleep apnea and is awaiting her titration study.  She notes that she does not get much exercise.  She has been very fatigued and sleeps a lot.  She recently started on treatment for her hypothyroidism.  TSH has been as high as 17 but is down to 6.4 when checked 3 weeks ago.  She has some shortness of breath when going up and down steps but denies any chest pain or pressure.  She did undergo cardiac  catheterization 02/2020 that revealed normal coronaries.  She denies any lower extremity edema, orthopnea, or PND.  She mostly cooks at home and does not use any salt.  She has no caffeine in her diet and does not use NSAIDs.  She started on prednisone last night for a gout attack.  She also notes that she had a surprise party for completing her chemotherapy.  Since then she has been eating a lot of barbecue.  She has not yet taken her medications this morning because she is fasting.  Lately at home her blood pressure has been running in the 110s to 120s over 90s to 100s.   Past Medical History:  Diagnosis Date  . Allergy   . Anemia    Normocytic  . Anxiety   . Asthma   . Blood dyscrasia   . Bronchitis 2005  . 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 Diabetes mellitus without complication ([place])   . Family history of breast cancer   . Family history of lung cancer   . Family history of prostate cancer   . Gastroenteritis 07/10/07  . GERD 07/24/2006  . GRAVE'S DISEASE 01/01/2008  . History of hidradenitis suppurativa   . History of kidney stones   . History of thrush   . HIV DISEASE 07/24/2006   dx March 05  . Hyperlipidemia   . HYPERTENSION 07/24/2006  . Hyperthyroidism 08/2006   Grave's Disease -diffuse radiotracer uptake 08/25/06 Thyroid scan-Cold nodule to R lower lobe of thyrorid  .  Menometrorrhagia    hx of  . Nephrolithiasis   . 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 Personal history of chemotherapy    2020  . Personal history of radiation therapy    2020  . Pneumonia 2005  . 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 Sarcoidosis 02/08/2007   dx as a teenager in [place] from abnl CXR. Completed 2 yrs Prednisone after lung bx confirmation. No symptoms since then.  . Suppurative hidradenitis   . Thyroid cancer ([place])   . THYROID NODULE, RIGHT 02/08/2007    Past Surgical History:  Procedure Laterality Date    . BREAST EXCISIONAL BIOPSY Right 04/26/2018   right axilla negative  . BREAST EXCISIONAL BIOPSY Left 04/26/2018   left axilla negative  . BREAST LUMPECTOMY Right 10/03/2018   malignant  . BREAST LUMPECTOMY WITH RADIOACTIVE SEED AND SENTINEL LYMPH NODE BIOPSY Right 10/03/2018   Procedure: RIGHT BREAST LUMPECTOMY WITH RADIOACTIVE SEED AND SENTINEL LYMPH NODE MAPPING;  Surgeon: Erroll Luna, MD;  Location: [facility];  Service: General;  Laterality: Right;  . BREAST SURGERY  1997   Breast Reduction   . CYSTOSCOPY W/ URETERAL STENT REMOVAL  11/09/2012   Procedure: CYSTOSCOPY WITH STENT REMOVAL;  Surgeon: Alexis Frock, MD;  Location: [facility];  Service: Urology;  Laterality: Right;  . CYSTOSCOPY WITH RETROGRADE PYELOGRAM, URETEROSCOPY AND STENT PLACEMENT  11/09/2012   Procedure: CYSTOSCOPY WITH RETROGRADE PYELOGRAM, URETEROSCOPY AND STENT PLACEMENT;  Surgeon: Alexis Frock, MD;  [facility];  Service: Urology;  Laterality: Left;  LEFT URETEROSCOPY, STONE MANIPULATION, left STENT exchange   . CYSTOSCOPY WITH STENT PLACEMENT  10/02/2012   Procedure: CYSTOSCOPY WITH STENT PLACEMENT;  Surgeon: Alexis Frock, MD;  [facility];  Service: Urology;  Laterality: Left;  . DILATION AND CURETTAGE OF UTERUS  Feb 2004   s/p for 1st trimester nonviable pregnancy  . EYE SURGERY     sty under eyelid  . INCISE AND DRAIN ABCESS  Nov 03   s/p I &D for righ inframmary fold hidradenitis  . INCISION AND DRAINAGE PERITONSILLAR ABSCESS  Mar 03  . IR CV LINE INJECTION  06/07/2018  . IR IMAGING GUIDED PORT INSERTION  06/20/2018  . IR REMOVAL TUN ACCESS W/ PORT W/O FL MOD SED  06/20/2018  . IRRIGATION AND DEBRIDEMENT ABSCESS  01/31/2012   Procedure: IRRIGATION AND DEBRIDEMENT ABSCESS;  Surgeon: Shann Medal, MD;  [facility];  Service: General;  Laterality: Right;  right breast and axilla   . NEPHROLITHOTOMY  10/02/2012   Procedure: NEPHROLITHOTOMY PERCUTANEOUS;  Surgeon: Alexis Frock, MD;  [facility];  Service: Urology;  Laterality: Right;  First Stage Percutaneous Nephrolithotomy with Surgeon Access, Left Ureteral Stent    . NEPHROLITHOTOMY  10/04/2012   Procedure: NEPHROLITHOTOMY PERCUTANEOUS SECOND LOOK;  Surgeon: Alexis Frock, MD;  [facility];  Service: Urology;  Laterality: Right;     . NEPHROLITHOTOMY  10/08/2012   Procedure: NEPHROLITHOTOMY PERCUTANEOUS;  Surgeon: Alexis Frock, MD;  [facility];  Service: Urology;  Laterality: Right;  THIRD STAGE, nephrostomy tube exchange x 2  . NEPHROLITHOTOMY  10/11/2012   Procedure: NEPHROLITHOTOMY PERCUTANEOUS SECOND LOOK;  Surgeon: Alexis Frock, MD;  [facility];  Service: Urology;  Laterality: Right;  RIGHT 4 STAGE PERCUTANOUS NEPHROLITHOTOMY, right URETEROSCOPY WITH HOLMIUM LASER   . PORT-A-CATH REMOVAL N/A 07/16/2020   Procedure: REMOVAL PORT-A-CATH;  Surgeon: Erroll Luna, MD;  Location: [facility];  Service: General;  Laterality:  N/A;  . PORTACATH PLACEMENT Left 05/17/2018   Procedure: INSERTION PORT-A-CATH;  Surgeon: Coralie Keens, MD;  Location: [facility];  Service: General;  Laterality: Left;  . RADICAL NECK DISSECTION  04/12/2017   limited/notes 04/12/2017  . RADICAL NECK DISSECTION N/A 04/12/2017   Procedure: RADICAL NECK DISSECTION;  Surgeon: Melida Quitter, MD;  Location: [facility];  Service: ENT;  Laterality: N/A;  limited neck dissection 2 hours total  . REDUCTION MAMMAPLASTY Bilateral 1998  . RIGHT/LEFT HEART CATH AND CORONARY ANGIOGRAPHY N/A 03/12/2020   Procedure: RIGHT/LEFT HEART CATH AND CORONARY ANGIOGRAPHY;  Surgeon: Jolaine Artist, MD;  Location: [facility];  Service: Cardiovascular;  Laterality: N/A;  . [place]  1994  . THYROIDECTOMY  04/12/2017   completion/notes 04/12/2017  . THYROIDECTOMY N/A 04/12/2017   Procedure: THYROIDECTOMY;  Surgeon: Melida Quitter, MD;  Location: [facility];  Service: ENT;  Laterality: N/A;  Completion Thyroidectomy  . TOTAL  THYROIDECTOMY  2010    Current Medications: No outpatient medications have been marked as taking for the 08/13/20 encounter (Appointment) with Skeet Latch, MD.     AllergiesJorje Guild [elviteg-cobic-emtricit-tenofaf], Lisinopril, and Valsartan   Social History   Socioeconomic History  . Marital status: Single    Spouse name: Not on file  . Number of children: 1  . Years of education: Not on file  . Highest education level: Not on file  Occupational History  . Occupation: Secondary school teacher  Tobacco Use  . Smoking status: Former Smoker    Packs/day: 0.50    Years: 15.00    Pack years: 7.50    Types: Cigarettes    Start date: 04/12/2017  . Smokeless tobacco: Never Used  . Tobacco comment: quit smoking  may 2018  Vaping Use  . Vaping Use: Never used  Substance and Sexual Activity  . Alcohol use: Yes    Alcohol/week: 0.0 standard drinks    Comment: social  . Drug use: No  . Sexual activity: Not Currently    Birth control/protection: Post-menopausal    Comment: declined condoms  Other Topics Concern  . Not on file  Social History Narrative  . Not on file   Social Determinants of Health   Financial Resource Strain:   . Difficulty of Paying Living Expenses: Not on file  Food Insecurity:   . Worried About Charity fundraiser in the Last Year: Not on file  . Ran Out of Food in the Last Year: Not on file  Transportation Needs:   . Lack of Transportation (Medical): Not on file  . Lack of Transportation (Non-Medical): Not on file  Physical Activity:   . Days of Exercise per Week: Not on file  . Minutes of Exercise per Session: Not on file  Stress:   . Feeling of Stress : Not on file  Social Connections:   . Frequency of Communication with Friends and Family: Not on file  . Frequency of Social Gatherings with Friends and Family: Not on file  . Attends Religious Services: Not on file  . Active Member of Clubs or Organizations: Not on file  . Attends Theatre manager Meetings: Not on file  . Marital Status: Not on file     Family History: The patient's family history includes Breast cancer in her cousin, cousin, cousin, maternal aunt, paternal aunt, and paternal aunt; Breast cancer (age of onset: 48) in her cousin; Breast cancer (age of onset: 7) in her paternal aunt; Breast cancer (age of  onset: 69) in her maternal aunt; Cancer in her maternal uncle and mother; Diabetes in an other family member; Heart disease in her mother and another family member; Hypertension in her brother, father, mother, sister, sister, and another family member; Kidney disease in an other family member; Lung cancer in her paternal uncle; Lung cancer (age of onset: 63) in her father; Prostate cancer in her paternal uncle and paternal uncle; Stroke in an other family member. There is no history of Colon cancer, Esophageal cancer, Rectal cancer, or Stomach cancer.  ROS:   Please see the history of present illness.    All other systems reviewed and are negative.  EKGs/Labs/Other Studies Reviewed:    EKG:  EKG is ordered today.   08/13/2020: Sinus rhythm.  Rate 71 bpm.  QTc 485 ms  RHC/LHC 02/2020: Findings:  Ao = 175/101 (131) LV = 175/11 RA = 6 RV = 40/11 PA = 41/12 (26) PCW = 10 Fick cardiac output/index = 5.1/2.4 PVR = 3.2 WU Ao sat = 96% PA sat = 64%, 65%  Assessment: 1. Normal coronary arteries 2. LVEF 60-65% 3. Mild PAH likely due to OSA/OHS 4. Otherwise normal hemodynamics 4. Severe systemic HTN  Echo 02/2020: 1. Left ventricular ejection fraction, by estimation, is 60 to 65%. The  left ventricle has normal function. The left ventricle has no regional  wall motion abnormalities. Left ventricular diastolic parameters are  consistent with Grade I diastolic  dysfunction (impaired relaxation). The average left ventricular global  longitudinal strain is -14.4 %.  2. Right ventricular systolic function is normal. The right ventricular  size is  normal.  3. The mitral valve is normal in structure. Mild mitral valve  regurgitation. No evidence of mitral stenosis.  4. The aortic valve is tricuspid. Aortic valve regurgitation is not  visualized. Mild aortic valve sclerosis is present, with no evidence of  aortic valve stenosis.  5. The inferior vena cava is normal in size with greater than 50%  respiratory variability, suggesting right atrial pressure of 3 mmHg.    Recent Labs: 07/14/2020: ALT 26; BUN 18; Creatinine, Ser 1.73; Hemoglobin 12.9; Platelets 204; Potassium 3.7; Sodium 141 07/21/2020: TSH 6.46   Recent Lipid Panel    Component Value Date/Time   CHOL 217 (H) 11/20/2019 1203   TRIG 221 (H) 11/20/2019 1203   HDL 40 (L) 11/20/2019 1203   CHOLHDL 5.4 (H) 11/20/2019 1203   VLDL 51.0 (H) 06/05/2019 1128   LDLCALC 141 (H) 11/20/2019 1203   LDLDIRECT 171.0 06/05/2019 1128    Physical Exam:    VS:  BP (!) 162/102   Pulse 71   Ht 5\' 5"  (1.651 m)   Wt 234 lb (106.1 kg)   LMP 03/31/2014   SpO2 99%   BMI 38.94 kg/m  , BMI Body mass index is 38.94 kg/m. GENERAL:  Well appearing HEENT: Pupils equal round and reactive, fundi not visualized, oral mucosa unremarkable NECK:  No jugular venous distention, waveform within normal limits, carotid upstroke brisk and symmetric, no bruits LUNGS:  Clear to auscultation bilaterally HEART:  RRR.  PMI not displaced or sustained,S1 and S2 within normal limits, no S3, no S4, no clicks, no rubs, no murmurs ABD:  Flat, positive bowel sounds normal in frequency in pitch, no bruits, no rebound, no guarding, no midline pulsatile mass, no hepatomegaly, no splenomegaly EXT:  2 plus pulses throughout, no edema, no cyanosis no clubbing SKIN:  No rashes no nodules NEURO:  Cranial nerves II through XII grossly intact,  motor grossly intact throughout PSYCH:  Cognitively intact, oriented to person place and time   ASSESSMENT:    No diagnosis found.  PLAN:    # Resistant hypertension:    [name]'s blood pressure has been a little bit better lately.  However her diastolic blood pressures have been higher.  Today her blood pressure was even higher than usual.  This may be because she just started taking prednisone for a gout flare.  She is also been eating more barbecue lately.  She is going to start tracking her blood pressures at home and bring this to follow-up.  She will continue irbesartan/HCTZ and spironolactone.  We will switch her metoprolol to carvedilol 25 mg twice daily for improved blood pressure control.  She will start back limiting the salt in her diet.  She is also interested in going to the prep exercise program through the [facility] to learn more about diet and exercise opportunities.  I suspect that normalization of her thyroid will help, as well getting on her CPAP.  In the future, would like to check for hyperaldosteronism, especially given that she had hypokalemia with a potassium of 2.9 in the past.  However checking with me we would need to hold her irbesartan/HCTZ and spironolactone.  We will defer that for now given that her blood pressure is so high.  We will also refer her for renal artery Dopplers.  Secondary Causes of Hypertension  Medications/Herbal: OCP, steroids, stimulants, antidepressants, weight loss medication, immune suppressants, NSAIDs, sympathomimetics, alcohol, caffeine, licorice, ginseng, St. John's wort, chemo  Sleep Apnea: CPAP pending Renal artery stenosis: Check renal artery Dopplers Hyperaldosteronism: Check once blood pressure more stable Hyper/hypothyroidism: Working on thyroid management Pheochromocytoma: (testing not indicated)  Cushing's syndrome: (testing not indicated)  Coarctation of the aorta: Negative on CT 09/2018     Disposition:    FU with MD/PharmD in 1 month    Medication Adjustments/Labs and Tests Ordered: Current medicines are reviewed at length with the patient today.  Concerns regarding medicines are outlined above.    No orders of the defined types were placed in this encounter.  No orders of the defined types were placed in this encounter.    Signed, Skeet Latch, MD  08/13/2020 9:23 AM    [facility]

## 2020-08-13 NOTE — Patient Instructions (Addendum)
Medication Instructions:  STOP METOPROLOL   START CARVEDILOL 25 MG TWICE A DAY    Labwork: NONE   Testing/Procedures: NONE   Follow-Up: 1 MONTH WITH PHARM D 09/16/2020 AT 10:30 AM    You will receive a phone call from the PREP exercise and nutrition program to schedule an initial assessment.  Special Instructions:   MONITOR YOUR BLOOD PRESSURE TWICE A DAY, LOG IN THE BOOK PROVIDED. BRING THE BOOK AND YOUR BLOOD PRESSURE MACHINE TO YOUR FOLLOW UP IN 1 MONTH   DASH Eating Plan DASH stands for "Dietary Approaches to Stop Hypertension." The DASH eating plan is a healthy eating plan that has been shown to reduce high blood pressure (hypertension). It may also reduce your risk for type 2 diabetes, heart disease, and stroke. The DASH eating plan may also help with weight loss. What are tips for following this plan?  General guidelines  Avoid eating more than 2,300 mg (milligrams) of salt (sodium) a day. If you have hypertension, you may need to reduce your sodium intake to 1,500 mg a day.  Limit alcohol intake to no more than 1 drink a day for nonpregnant women and 2 drinks a day for men. One drink equals 12 oz of beer, 5 oz of wine, or 1 oz of hard liquor.  Work with your health care provider to maintain a healthy body weight or to lose weight. Ask what an ideal weight is for you.  Get at least 30 minutes of exercise that causes your heart to beat faster (aerobic exercise) most days of the week. Activities may include walking, swimming, or biking.  Work with your health care provider or diet and nutrition specialist (dietitian) to adjust your eating plan to your individual calorie needs. Reading food labels   Check food labels for the amount of sodium per serving. Choose foods with less than 5 percent of the Daily Value of sodium. Generally, foods with less than 300 mg of sodium per serving fit into this eating plan.  To find whole grains, look for the word "whole" as the first word  in the ingredient list. Shopping  Buy products labeled as "low-sodium" or "no salt added."  Buy fresh foods. Avoid canned foods and premade or frozen meals. Cooking  Avoid adding salt when cooking. Use salt-free seasonings or herbs instead of table salt or sea salt. Check with your health care provider or pharmacist before using salt substitutes.  Do not fry foods. Cook foods using healthy methods such as baking, boiling, grilling, and broiling instead.  Cook with heart-healthy oils, such as olive, canola, soybean, or sunflower oil. Meal planning  Eat a balanced diet that includes: ? 5 or more servings of fruits and vegetables each day. At each meal, try to fill half of your plate with fruits and vegetables. ? Up to 6-8 servings of whole grains each day. ? Less than 6 oz of lean meat, poultry, or fish each day. A 3-oz serving of meat is about the same size as a deck of cards. One egg equals 1 oz. ? 2 servings of low-fat dairy each day. ? A serving of nuts, seeds, or beans 5 times each week. ? Heart-healthy fats. Healthy fats called Omega-3 fatty acids are found in foods such as flaxseeds and coldwater fish, like sardines, salmon, and mackerel.  Limit how much you eat of the following: ? Canned or prepackaged foods. ? Food that is high in trans fat, such as fried foods. ? Food that is high  in saturated fat, such as fatty meat. ? Sweets, desserts, sugary drinks, and other foods with added sugar. ? Full-fat dairy products.  Do not salt foods before eating.  Try to eat at least 2 vegetarian meals each week.  Eat more home-cooked food and less restaurant, buffet, and fast food.  When eating at a restaurant, ask that your food be prepared with less salt or no salt, if possible. What foods are recommended? The items listed may not be a complete list. Talk with your dietitian about what dietary choices are best for you. Grains Whole-grain or whole-wheat bread. Whole-grain or  whole-wheat pasta. Brown rice. [place]. Bulgur. Whole-grain and low-sodium cereals. Pita bread. Low-fat, low-sodium crackers. Whole-wheat flour tortillas. Vegetables Fresh or frozen vegetables (raw, steamed, roasted, or grilled). Low-sodium or reduced-sodium tomato and vegetable juice. Low-sodium or reduced-sodium tomato sauce and tomato paste. Low-sodium or reduced-sodium canned vegetables. Fruits All fresh, dried, or frozen fruit. Canned fruit in natural juice (without added sugar). Meat and other protein foods Skinless chicken or [place]. Ground chicken or [place]. Pork with fat trimmed off. Fish and seafood. Egg whites. Dried beans, peas, or lentils. Unsalted nuts, nut butters, and seeds. Unsalted canned beans. Lean cuts of beef with fat trimmed off. Low-sodium, lean deli meat. Dairy Low-fat (1%) or fat-free (skim) milk. Fat-free, low-fat, or reduced-fat cheeses. Nonfat, low-sodium ricotta or cottage cheese. Low-fat or nonfat yogurt. Low-fat, low-sodium cheese. Fats and oils Soft margarine without trans fats. Vegetable oil. Low-fat, reduced-fat, or light mayonnaise and salad dressings (reduced-sodium). Canola, safflower, olive, soybean, and sunflower oils. Avocado. Seasoning and other foods Herbs. Spices. Seasoning mixes without salt. Unsalted popcorn and pretzels. Fat-free sweets. What foods are not recommended? The items listed may not be a complete list. Talk with your dietitian about what dietary choices are best for you. Grains Baked goods made with fat, such as croissants, muffins, or some breads. Dry pasta or rice meal packs. Vegetables Creamed or fried vegetables. Vegetables in a cheese sauce. Regular canned vegetables (not low-sodium or reduced-sodium). Regular canned tomato sauce and paste (not low-sodium or reduced-sodium). Regular tomato and vegetable juice (not low-sodium or reduced-sodium). Angie Fava. Olives. Fruits Canned fruit in a light or heavy syrup. Fried fruit. Fruit  in cream or butter sauce. Meat and other protein foods Fatty cuts of meat. Ribs. Fried meat. Berniece Salines. Sausage. Bologna and other processed lunch meats. Salami. Fatback. Hotdogs. Bratwurst. Salted nuts and seeds. Canned beans with added salt. Canned or smoked fish. Whole eggs or egg yolks. Chicken or [place] with skin. Dairy Whole or 2% milk, cream, and half-and-half. Whole or full-fat cream cheese. Whole-fat or sweetened yogurt. Full-fat cheese. Nondairy creamers. Whipped toppings. Processed cheese and cheese spreads. Fats and oils Butter. Stick margarine. Lard. Shortening. Ghee. Bacon fat. Tropical oils, such as coconut, palm kernel, or palm oil. Seasoning and other foods Salted popcorn and pretzels. Onion salt, garlic salt, seasoned salt, table salt, and sea salt. Worcestershire sauce. Tartar sauce. Barbecue sauce. Teriyaki sauce. Soy sauce, including reduced-sodium. Steak sauce. Canned and packaged gravies. Fish sauce. Oyster sauce. Cocktail sauce. Horseradish that you find on the shelf. Ketchup. Mustard. Meat flavorings and tenderizers. Bouillon cubes. Hot sauce and Tabasco sauce. Premade or packaged marinades. Premade or packaged taco seasonings. Relishes. Regular salad dressings. Where to find more information:  National Heart, Lung, and [place]: https://wilson-eaton.com/  American Heart Association: www.heart.org Summary  The DASH eating plan is a healthy eating plan that has been shown to reduce high blood pressure (hypertension). It may  also reduce your risk for type 2 diabetes, heart disease, and stroke.  With the DASH eating plan, you should limit salt (sodium) intake to 2,300 mg a day. If you have hypertension, you may need to reduce your sodium intake to 1,500 mg a day.  When on the DASH eating plan, aim to eat more fresh fruits and vegetables, whole grains, lean proteins, low-fat dairy, and heart-healthy fats.  Work with your health care provider or diet and nutrition specialist  (dietitian) to adjust your eating plan to your individual calorie needs. This information is not intended to replace advice given to you by your health care provider. Make sure you discuss any questions you have with your health care provider. Document Released: 09/22/2011 Document Revised: 09/15/2017 Document Reviewed: 09/26/2016 Elsevier Patient Education  2020 [employer].

## 2020-08-14 ENCOUNTER — Telehealth: Payer: Self-pay

## 2020-08-14 NOTE — Telephone Encounter (Signed)
[facility] requesting call back reference PREP referral 

## 2020-08-17 NOTE — Telephone Encounter (Signed)
Patient is scheduled for CPAP Titration on 09/22/20. Patient understands her titration study will be done at [facility] sleep lab. Patient understands she will receive a letter in a week or so detailing appointment, date, time, and location. Patient understands to call if she does not receive the letter  in a timely manner. Patient agrees with treatment and thanked me for call.

## 2020-08-17 NOTE — Telephone Encounter (Signed)
CPAP titration PA submitted to [facility] via [employer]. Ok to schedule TITRATION valid dates are 08/31/20--11/29/2020.

## 2020-08-17 NOTE — Addendum Note (Signed)
Addended by: Freada Bergeron on: 08/17/2020 12:57 PM   Modules accepted: Orders

## 2020-08-18 ENCOUNTER — Other Ambulatory Visit: Payer: Self-pay

## 2020-08-18 ENCOUNTER — Other Ambulatory Visit: Payer: Self-pay | Admitting: Primary Care

## 2020-08-18 ENCOUNTER — Other Ambulatory Visit: Payer: 59

## 2020-08-18 DIAGNOSIS — E119 Type 2 diabetes mellitus without complications: Secondary | ICD-10-CM

## 2020-08-18 DIAGNOSIS — B20 Human immunodeficiency virus [HIV] disease: Secondary | ICD-10-CM

## 2020-08-18 NOTE — Telephone Encounter (Signed)
Last VV 06/16/20 Last fill 08/27/19  #180/1

## 2020-08-19 LAB — T-HELPER CELL (CD4) - (RCID CLINIC ONLY)
CD4 % Helper T Cell: 35 % (ref 33–65)
CD4 T Cell Abs: 624 /uL (ref 400–1790)

## 2020-08-20 LAB — HIV-1 RNA QUANT-NO REFLEX-BLD
HIV 1 RNA Quant: 49 Copies/mL — ABNORMAL HIGH
HIV-1 RNA Quant, Log: 1.69 Log cps/mL — ABNORMAL HIGH

## 2020-09-03 ENCOUNTER — Encounter: Payer: 59 | Admitting: Internal Medicine

## 2020-09-16 ENCOUNTER — Ambulatory Visit: Payer: 59

## 2020-09-16 ENCOUNTER — Other Ambulatory Visit: Payer: Self-pay | Admitting: Internal Medicine

## 2020-09-16 ENCOUNTER — Other Ambulatory Visit: Payer: Self-pay

## 2020-09-16 ENCOUNTER — Other Ambulatory Visit: Payer: Self-pay | Admitting: Primary Care

## 2020-09-16 ENCOUNTER — Ambulatory Visit (INDEPENDENT_AMBULATORY_CARE_PROVIDER_SITE_OTHER): Payer: 59 | Admitting: Internal Medicine

## 2020-09-16 ENCOUNTER — Encounter: Payer: Self-pay | Admitting: Internal Medicine

## 2020-09-16 ENCOUNTER — Other Ambulatory Visit: Payer: Self-pay | Admitting: Adult Health

## 2020-09-16 VITALS — BP 147/86 | HR 68 | Temp 98.0°F | Ht 67.0 in | Wt 233.0 lb

## 2020-09-16 DIAGNOSIS — F09 Unspecified mental disorder due to known physiological condition: Secondary | ICD-10-CM

## 2020-09-16 DIAGNOSIS — L732 Hidradenitis suppurativa: Secondary | ICD-10-CM | POA: Diagnosis not present

## 2020-09-16 DIAGNOSIS — B20 Human immunodeficiency virus [HIV] disease: Secondary | ICD-10-CM

## 2020-09-16 DIAGNOSIS — L0293 Carbuncle, unspecified: Secondary | ICD-10-CM

## 2020-09-16 DIAGNOSIS — C50311 Malignant neoplasm of lower-inner quadrant of right female breast: Secondary | ICD-10-CM

## 2020-09-16 DIAGNOSIS — Z23 Encounter for immunization: Secondary | ICD-10-CM

## 2020-09-16 DIAGNOSIS — Z170 Estrogen receptor positive status [ER+]: Secondary | ICD-10-CM

## 2020-09-16 DIAGNOSIS — E890 Postprocedural hypothyroidism: Secondary | ICD-10-CM

## 2020-09-16 MED ORDER — DESCOVY 200-25 MG PO TABS: 1.0000 | ORAL_TABLET | Freq: Every day | ORAL | 11 refills | Status: DC

## 2020-09-16 MED ORDER — TIVICAY 50 MG PO TABS: ORAL_TABLET | ORAL | 11 refills | Status: DC

## 2020-09-16 MED ORDER — AMOXICILLIN-POT CLAVULANATE 875-125 MG PO TABS: 1.0000 | ORAL_TABLET | Freq: Two times a day (BID) | ORAL | 0 refills | Status: DC

## 2020-09-16 NOTE — Progress Notes (Signed)
Patient Active Problem List   Diagnosis Date Noted  . Malignant neoplasm of lower-inner quadrant of right breast of female, estrogen receptor positive ([name]) 05/10/2018    Priority: Medium  . Hidradenitis suppurativa of right >> left axilla 01/30/2012    Priority: Medium  . 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 Cigarette smoker 02/08/2007    Priority: Medium  . HIV disease ([place]) 07/24/2006    Priority: Medium  . Depression 07/24/2006    Priority: Medium  . Essential hypertension 07/24/2006    Priority: Medium  . Asthma 07/24/2006    Priority: Medium  . Impingement syndrome of left shoulder region 05/27/2020  . Panniculitis 05/12/2020  . Inclusion cyst 05/01/2020  . Sebaceous cyst 04/21/2020  . Postoperative breast asymmetry 02/28/2020  . Cognitive dysfunction 02/21/2020  . Acute gout involving toe of left foot 01/27/2020  . Hematuria 12/10/2019  . Neuropathy due to chemotherapeutic drug ([place]) 12/10/2019  . Bursitis of left shoulder 09/06/2019  . Pain in joint of left shoulder 09/05/2019  . Dysphagia 07/23/2019  . CKD stage 3 due to type 2 diabetes mellitus ([place]) 07/12/2019  . Normocytic anemia 07/12/2019  . Preventative health care 09/10/2018  . Genetic testing 07/25/2018  . Port-A-Cath in place 07/12/2018  . Family history of breast cancer   . Family history of prostate cancer   . Family history of lung cancer   . Hyperlipidemia 04/26/2017  . Chronic anxiety 04/11/2017  . Mass of left side of neck 11/24/2016  . Laryngopharyngeal reflux (LPR) 11/24/2016  . Upper airway cough syndrome 11/11/2016  . Morbid (severe) obesity due to excess calories ([name]) 10/14/2016  . Allergic rhinitis 02/18/2016  . Non-insulin dependent type 2 diabetes mellitus ([name]) 06/23/2015  . Renal calculi 10/29/2014  . Recurrent boils 06/11/2014  . Sarcoidosis 02/08/2007  . GERD 07/24/2006    Patient's Medications  New Prescriptions    AMOXICILLIN-CLAVULANATE (AUGMENTIN) 875-125 MG TABLET    Take 1 tablet by mouth 2 (two) times daily.  Previous Medications   BLOOD GLUCOSE MONITORING SUPPL (ONETOUCH VERIO) W/DEVICE KIT    USE AS INSTRUCTED TO TEST BLOOD SUGAR UP TO 4 TIMES DAILY E11.9   CARVEDILOL (COREG) 25 MG TABLET    Take 1 tablet (25 mg total) by mouth 2 (two) times daily.   DOXYCYCLINE (VIBRA-TABS) 100 MG TABLET    doxycycline hyclate 100 mg tablet   EMPAGLIFLOZIN (JARDIANCE) 10 MG TABS TABLET    Take 1 tablet (10 mg total) by mouth daily before breakfast.   FLOVENT HFA 110 MCG/ACT INHALER    TAKE 1 PUFF BY MOUTH TWICE A DAY   GABAPENTIN (NEURONTIN) 300 MG CAPSULE    Take 1 capsule (300 mg total) by mouth 3 (three) times daily.   GLIPIZIDE (GLUCOTROL) 5 MG TABLET    TAKE 1 TABLET (5 MG TOTAL) BY MOUTH 2 (TWO) TIMES DAILY BEFORE A MEAL. FOR DIABETES.   GLUCOSE BLOOD (ONETOUCH VERIO) TEST STRIP    USE AS INSTRUCTED TO TEST BLOOD SUGAR DAILY   IRBESARTAN-HYDROCHLOROTHIAZIDE (AVALIDE) 150-12.5 MG TABLET    TAKE 1 TABLET BY MOUTH EVERY DAY FOR BLOOD PRESSURE   LANCETS (ONETOUCH ULTRASOFT) LANCETS    Use as instructed to test blood sugar daily   PREDNISONE (DELTASONE) 20 MG TABLET    Take 2 tablets daily for three days, then 1 tablet daily for three days for gout flare.   ROSUVASTATIN (CRESTOR) 10 MG TABLET  TAKE 1 TABLET BY MOUTH EVERY DAY FOR CHOLESTEROL   SPIRONOLACTONE (ALDACTONE) 25 MG TABLET    TAKE 1 TABLET BY MOUTH EVERY DAY   SYNTHROID 112 MCG TABLET    Take 2 tablets (224 mcg total) by mouth daily before breakfast.   TAMOXIFEN (NOLVADEX) 20 MG TABLET    Take 1 tablet (20 mg total) by mouth daily.   TRULICITY 8.31 DV/7.6HY SOPN    INJECT CONTENTS OF 1 PEN (0.75MG) INTO THE SKIN ONCE WEEKLY FOR DIABETES  Modified Medications   Modified Medication Previous Medication   DOLUTEGRAVIR ([name]) 50 MG TABLET [name] 50 MG tablet      TAKE 1 TABLET(50 MG) BY MOUTH DAILY    TAKE 1 TABLET(50 MG) BY MOUTH DAILY    EMTRICITABINE-TENOFOVIR AF (DESCOVY) 200-25 MG TABLET DESCOVY 200-25 MG tablet      Take 1 tablet by mouth daily.    TAKE 1 TABLET BY MOUTH DAILY  Discontinued Medications   No medications on file    Subjective: [name] is in for her routine HIV follow-up visit.  She has not had any problems obtaining, taking or tolerating her Descovy or [name].  She continues to miss 3 or 4 doses each month.  She blames this on being quite forgetful and extremely sleepy.  She tells me she was diagnosed as having sleep apnea with a recent sleep study.  She shrugged when I asked her if she was feeling depressed or anxious.  She is on chronic doxycycline for her recurrent boils and hidradenitis.  She recently developed a new painful lesion in her left axilla.  Review of Systems: Review of Systems  Constitutional: Positive for malaise/fatigue. Negative for fever and weight loss.  Respiratory: Negative for cough and shortness of breath.   Cardiovascular: Negative for chest pain.  Gastrointestinal: Negative for abdominal pain, diarrhea, nausea and vomiting.  Psychiatric/Behavioral: Positive for depression. The patient is nervous/anxious.     Past Medical History:  Diagnosis Date  . Allergy   . Anemia    Normocytic  . Anxiety   . Asthma   . Blood dyscrasia   . Bronchitis 2005  . 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 Diabetes mellitus without complication ([place])   . Family history of breast cancer   . Family history of lung cancer   . Family history of prostate cancer   . Gastroenteritis 07/10/07  . GERD 07/24/2006  . GRAVE'S DISEASE 01/01/2008  . History of hidradenitis suppurativa   . History of kidney stones   . History of thrush   . HIV DISEASE 07/24/2006   dx March 05  . Hyperlipidemia   . HYPERTENSION 07/24/2006  . Hyperthyroidism 08/2006   Grave's Disease -diffuse radiotracer uptake 08/25/06 Thyroid scan-Cold nodule to R lower lobe of thyrorid  . Menometrorrhagia    hx of  .  Nephrolithiasis   . 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 Personal history of chemotherapy    2020  . Personal history of radiation therapy    2020  . Pneumonia 2005  . 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 Sarcoidosis 02/08/2007   dx as a teenager in [place] from abnl CXR. Completed 2 yrs Prednisone after lung bx confirmation. No symptoms since then.  . Suppurative hidradenitis   . Thyroid cancer ([place])   . THYROID NODULE, RIGHT 02/08/2007    Social History   Tobacco Use  . Smoking status: Former Smoker    Packs/day: 0.50  Years: 15.00    Pack years: 7.50    Types: Cigarettes    Start date: 04/12/2017  . Smokeless tobacco: Never Used  . Tobacco comment: quit smoking  may 2018  Vaping Use  . Vaping Use: Never used  Substance Use Topics  . Alcohol use: Yes    Alcohol/week: 0.0 standard drinks    Comment: social  . Drug use: No    Family History  Problem Relation Age of Onset  . Hypertension Mother   . Cancer Mother        laryngeal  . Heart disease Mother        stent  . Hypertension Father   . Lung cancer Father 30       hx smoking  . Heart disease Other   . Hypertension Other   . Stroke Other        Grandparent  . Kidney disease Other        Grandparent  . Diabetes Other        FH of Diabetes  . Hypertension Sister   . Cancer Maternal Uncle        Lung CA  . Hypertension Brother   . Hypertension Sister   . Breast cancer Maternal Aunt 65  . Breast cancer Paternal Aunt 40  . Prostate cancer Paternal Uncle   . Breast cancer Maternal Aunt        dx 60+  . Breast cancer Paternal Aunt        dx 28's  . Breast cancer Paternal Aunt        dx 68's  . Prostate cancer Paternal Uncle   . Lung cancer Paternal Uncle   . Breast cancer Cousin 42  . Breast cancer Cousin        dx <50  . Breast cancer Cousin        dx <50  . Breast cancer Cousin        dx <50  . Colon cancer Neg Hx   .  Esophageal cancer Neg Hx   . Rectal cancer Neg Hx   . Stomach cancer Neg Hx     Allergies  Allergen Reactions  . Genvoya [Elviteg-Cobic-Emtricit-Tenofaf] Hives  . Lisinopril Cough  . Valsartan Other (See Comments) and Cough    Pt states she tolerates medicine now    Health Maintenance  Topic Date Due  . PAP SMEAR-Modifier  01/28/2014  . OPHTHALMOLOGY EXAM  07/15/2016  . FOOT EXAM  08/25/2019  . TETANUS/TDAP  10/18/2019  . HEMOGLOBIN A1C  07/28/2020  . MAMMOGRAM  07/06/2022  . COLONOSCOPY  11/16/2028  . INFLUENZA VACCINE  Completed  . PNEUMOCOCCAL POLYSACCHARIDE VACCINE AGE 12-64 HIGH RISK  Completed  . COVID-19 Vaccine  Completed  . Hepatitis C Screening  Completed  . HIV Screening  Completed    Objective:  Vitals:   09/16/20 1121  BP: (!) 147/86  Pulse: 68  Temp: 98 F (36.7 C)  Weight: 233 lb (105.7 kg)  Height: _0  (1.702 m)   Body mass index is 36.49 kg/m.  Physical Exam Constitutional:      Comments: She seems tired today but her spirits are good.  Cardiovascular:     Rate and Rhythm: Normal rate.  Pulmonary:     Effort: Pulmonary effort is normal.  Chest:    Musculoskeletal:        General: No swelling or tenderness.  Skin:    Comments: She has a small boil in her left axilla.  There is no surrounding fluctuance or erythema.  There is no drainage.  Neurological:     General: No focal deficit present.  Psychiatric:        Mood and Affect: Mood normal.     Lab Results Lab Results  Component Value Date   WBC 7.2 07/14/2020   HGB 12.9 07/14/2020   HCT 40.2 07/14/2020   MCV 97.1 07/14/2020   PLT 204 07/14/2020    Lab Results  Component Value Date   CREATININE 1.73 (H) 07/14/2020   BUN 18 07/14/2020   NA 141 07/14/2020   K 3.7 07/14/2020   CL 105 07/14/2020   CO2 30 07/14/2020    Lab Results  Component Value Date   ALT 26 07/14/2020   AST 29 07/14/2020   ALKPHOS 88 07/14/2020   BILITOT 0.3 07/14/2020    Lab Results  Component  Value Date   CHOL 217 (H) 11/20/2019   HDL 40 (L) 11/20/2019   LDLCALC 141 (H) 11/20/2019   LDLDIRECT 171.0 06/05/2019   TRIG 221 (H) 11/20/2019   CHOLHDL 5.4 (H) 11/20/2019   Lab Results  Component Value Date   LABRPR NON-REACTIVE 10/23/2017   HIV 1 RNA Quant  Date Value  08/18/2020 49 Copies/mL (H)  11/20/2019 <20 DETECTED copies/mL (A)  09/27/2018 <20 DETECTED copies/mL (A)   CD4 T Cell Abs (/uL)  Date Value  08/18/2020 624  11/20/2019 414  07/12/2019 778     Problem List Items Addressed This Visit      Medium   HIV disease ([place])    She has low level viral act sedation but overall her infection remains under very good long-term control.  She will continue Descovy and [name] and follow-up after lab work in 6 months.  She received her influenza vaccine here today and will schedule her Covid booster soon.      Relevant Medications   emtricitabine-tenofovir AF (DESCOVY) 200-25 MG tablet   dolutegravir ([name]) 50 MG tablet   Other Relevant Orders   T-helper cell (CD4)- ([facility] clinic only)   HIV-1 RNA quant-no reflex-bld   CBC   Comprehensive metabolic panel   Lipid panel   RPR   Hidradenitis suppurativa of right >> left axilla    I will give her a short course of amoxicillin clavulanate and have her continue chronic doxycycline.      Relevant Medications   emtricitabine-tenofovir AF (DESCOVY) 200-25 MG tablet   dolutegravir ([name]) 50 MG tablet     Unprioritized   Recurrent boils   Relevant Medications   amoxicillin-clavulanate (AUGMENTIN) 875-125 MG tablet   emtricitabine-tenofovir AF (DESCOVY) 200-25 MG tablet   dolutegravir ([name]) 50 MG tablet   Cognitive dysfunction    I suspect that her problems with memory loss and concentration are multifactorial and related to recent chemotherapy for breast cancer, depression and poor sleep.  She knows that she can see our behavioral health counselor at any time.  She has follow-up scheduled for her sleep apnea.        Other Visit Diagnoses    Need for immunization against influenza    -  Primary   Relevant Orders   Flu Vaccine QUAD 36+ mos IM (Completed)   Human immunodeficiency virus (HIV) disease ([place])       Relevant Medications   emtricitabine-tenofovir AF (DESCOVY) 200-25 MG tablet   dolutegravir ([name]) 50 MG tablet        Michel Bickers, MD [facility] [place] [facility] [phone]  [phone] pager   [phone] cell 09/16/2020, 12:04 PM

## 2020-09-16 NOTE — Assessment & Plan Note (Signed)
I suspect that her problems with memory loss and concentration are multifactorial and related to recent chemotherapy for breast cancer, depression and poor sleep.  She knows that she can see our behavioral health counselor at any time.  She has follow-up scheduled for her sleep apnea.

## 2020-09-16 NOTE — Assessment & Plan Note (Signed)
I will give her a short course of amoxicillin clavulanate and have her continue chronic doxycycline.

## 2020-09-16 NOTE — Telephone Encounter (Signed)
Called and left voicemail for patient to return call to office. Received a refill request for metoprolol which is not on patients medication list.

## 2020-09-16 NOTE — Assessment & Plan Note (Signed)
She has low level viral act sedation but overall her infection remains under very good long-term control.  She will continue Descovy and [name] and follow-up after lab work in 6 months.  She received her influenza vaccine here today and will schedule her Covid booster soon.

## 2020-09-17 ENCOUNTER — Telehealth: Payer: Self-pay

## 2020-09-17 NOTE — Telephone Encounter (Signed)
Patient called financial counselor today requesting alternative for Descovy. 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  Plan will not cover Descovy. If provider is okay changing medication; alternative will need to be sent before 12/15. [name] will provide CMA with covered alternatives.  Will forward message to provider.

## 2020-09-17 NOTE — Telephone Encounter (Signed)
She could probably be combined to a one pill once daily but will need to see what [name] finds out.

## 2020-09-18 ENCOUNTER — Other Ambulatory Visit: Payer: Self-pay | Admitting: Pharmacist

## 2020-09-18 ENCOUNTER — Other Ambulatory Visit (HOSPITAL_COMMUNITY): Payer: Self-pay

## 2020-09-18 DIAGNOSIS — B20 Human immunodeficiency virus [HIV] disease: Secondary | ICD-10-CM

## 2020-09-18 MED ORDER — SPIRONOLACTONE 25 MG PO TABS
25.0000 mg | ORAL_TABLET | Freq: Every day | ORAL | 3 refills | Status: DC
Start: 2020-09-18 — End: 2021-11-15

## 2020-09-18 MED ORDER — BIKTARVY 50-200-25 MG PO TABS: 1.0000 | ORAL_TABLET | Freq: Every day | ORAL | 11 refills | Status: DC

## 2020-09-18 NOTE — Telephone Encounter (Signed)
[name] tells me that her plan will cover [place]. I will send in the script for her.

## 2020-09-22 ENCOUNTER — Encounter (HOSPITAL_BASED_OUTPATIENT_CLINIC_OR_DEPARTMENT_OTHER): Payer: 59 | Admitting: Cardiology

## 2020-09-22 NOTE — Telephone Encounter (Signed)
Agree. Thanks

## 2020-09-23 ENCOUNTER — Other Ambulatory Visit: Payer: Self-pay

## 2020-09-23 DIAGNOSIS — M1A9XX0 Chronic gout, unspecified, without tophus (tophi): Secondary | ICD-10-CM

## 2020-09-23 NOTE — Telephone Encounter (Signed)
I spoke with pt and she said in the last 6 months has had 5 gout flairs. Pt said she takes few prednisone she has on hand and symptoms go away. Pt said started on a Tues with flair up; pt thinks 09/15/20. On 09/20/20 pt started with Prednisone 20 mg taking one daily for 3 days and today pt can be up walking around; prior to today painful to walk with redness and swelling in ankles at back of heels.pt last seen acute 06/16/20 and prior to that 01/27/20 for update. Pt said she panics if she does not have prednisone on hand when has a flair up of gout.pt requesting refill of prednisone 20 mg to [facility].pt request cb after reviewed by Gentry Fitz NP.

## 2020-09-24 NOTE — Telephone Encounter (Signed)
Recurrent use of prednisone is not best practice for recurrent gout flares. This is also not good to use if you have a history of diabetes. There is a better alternative medication to use for gout flares.  If she's experiencing recurrent gout flares then she needs to be on daily preventative medication to prevent gout and gout complications and also look into what may be causing or contributing to recurrent flares.  I would like to meet with her to discuss. We need to check her blood pressure and gout levels as well.  I am happy to work her in for December 10, or another day the following week.

## 2020-09-25 ENCOUNTER — Ambulatory Visit: Payer: 59

## 2020-09-25 NOTE — Telephone Encounter (Signed)
Called patient made follow up with our office on Monday.

## 2020-09-28 ENCOUNTER — Other Ambulatory Visit: Payer: Self-pay

## 2020-09-28 ENCOUNTER — Other Ambulatory Visit (HOSPITAL_COMMUNITY)
Admission: RE | Admit: 2020-09-28 | Discharge: 2020-09-28 | Disposition: A | Discharge status: Home / Self Care | Payer: 59 | Source: Ambulatory Visit | Attending: Primary Care | Admitting: Primary Care

## 2020-09-28 ENCOUNTER — Ambulatory Visit (INDEPENDENT_AMBULATORY_CARE_PROVIDER_SITE_OTHER): Payer: 59 | Admitting: Primary Care

## 2020-09-28 ENCOUNTER — Encounter: Payer: Self-pay | Admitting: Primary Care

## 2020-09-28 VITALS — BP 154/88 | HR 77 | Temp 98.1°F | Ht 67.0 in | Wt 235.0 lb

## 2020-09-28 DIAGNOSIS — E119 Type 2 diabetes mellitus without complications: Secondary | ICD-10-CM

## 2020-09-28 DIAGNOSIS — B379 Candidiasis, unspecified: Secondary | ICD-10-CM

## 2020-09-28 DIAGNOSIS — F331 Major depressive disorder, recurrent, moderate: Secondary | ICD-10-CM

## 2020-09-28 DIAGNOSIS — R300 Dysuria: Secondary | ICD-10-CM | POA: Insufficient documentation

## 2020-09-28 DIAGNOSIS — T3695XA Adverse effect of unspecified systemic antibiotic, initial encounter: Secondary | ICD-10-CM

## 2020-09-28 DIAGNOSIS — Z124 Encounter for screening for malignant neoplasm of cervix: Secondary | ICD-10-CM

## 2020-09-28 DIAGNOSIS — F411 Generalized anxiety disorder: Secondary | ICD-10-CM

## 2020-09-28 DIAGNOSIS — M1A09X0 Idiopathic chronic gout, multiple sites, without tophus (tophi): Secondary | ICD-10-CM

## 2020-09-28 DIAGNOSIS — N898 Other specified noninflammatory disorders of vagina: Secondary | ICD-10-CM

## 2020-09-28 HISTORY — DX: Candidiasis, unspecified: B37.9

## 2020-09-28 LAB — POC URINALSYSI DIPSTICK (AUTOMATED)
Bilirubin, UA: NEGATIVE
Glucose, UA: POSITIVE — AB
Ketones, UA: NEGATIVE
Nitrite, UA: NEGATIVE
Protein, UA: POSITIVE — AB
Spec Grav, UA: 1.02 (ref 1.010–1.025)
Urobilinogen, UA: 0.2 E.U./dL
pH, UA: 5.5 (ref 5.0–8.0)

## 2020-09-28 LAB — BASIC METABOLIC PANEL
BUN: 28 mg/dL — ABNORMAL HIGH (ref 6–23)
CO2: 29 mEq/L (ref 19–32)
Calcium: 9.2 mg/dL (ref 8.4–10.5)
Chloride: 103 mEq/L (ref 96–112)
Creatinine, Ser: 2.02 mg/dL — ABNORMAL HIGH (ref 0.40–1.20)
GFR: 27.89 mL/min — ABNORMAL LOW (ref >60.00)
Glucose, Bld: 156 mg/dL — ABNORMAL HIGH (ref 70–99)
Potassium: 3.8 mEq/L (ref 3.5–5.1)
Sodium: 137 mEq/L (ref 135–145)

## 2020-09-28 LAB — POCT GLYCOSYLATED HEMOGLOBIN (HGB A1C): Hemoglobin A1C: 7.7 % — AB (ref 4.0–5.6)

## 2020-09-28 LAB — URIC ACID: Uric Acid, Serum: 8.7 mg/dL — ABNORMAL HIGH (ref 2.4–7.0)

## 2020-09-28 MED ORDER — SERTRALINE HCL 50 MG PO TABS: 50.0000 mg | ORAL_TABLET | Freq: Every day | ORAL | 1 refills | Status: DC

## 2020-09-28 MED ORDER — FLUCONAZOLE 150 MG PO TABS: 150.0000 mg | ORAL_TABLET | Freq: Once | ORAL | 0 refills | Status: AC

## 2020-09-28 MED ORDER — SULFAMETHOXAZOLE-TRIMETHOPRIM 800-160 MG PO TABS: 1.0000 | ORAL_TABLET | Freq: Two times a day (BID) | ORAL | 0 refills | Status: DC

## 2020-09-28 NOTE — Assessment & Plan Note (Signed)
Chronic and ongoing, unfortunately therapy referral did not go through when placed last visit.  Referral placed again today. She would like to start treatment, discussed options and we decided on Zoloft at the milligrams.  We will need to touch base with her oncologist as there was a potential interaction with her tamoxifen.  Discussed instructions for use of Zoloft, also discussed potential side effects.  If her oncologist is okay with [place] proceeding, will continue Zoloft at the milligrams and follow-up in 6 weeks.

## 2020-09-28 NOTE — Progress Notes (Signed)
Subjective:    Patient ID: [name], female    DOB: [birth year], 54 y.o.   MRN: [id number]  HPI  This visit occurred during the SARS-CoV-2 public health emergency.  Safety protocols were in place, including screening questions prior to the visit, additional usage of staff PPE, and extensive cleaning of exam room while observing appropriate contact time as indicated for disinfecting solutions.   [name] is a 54 year old female with a significant medical history including breast cancer, HIV, CKD, Stage III, thyroid carcinoma, post surgical hypothyroidism, chronic gout who presents today with a chief complaint of gout. She also reports vaginal itching and dysuria.  1) Chronic Gout: Diagnosed in April 2021 and has had recurrent flares every few months since. She called last week with reports recurrent gout flares, about 5 over the last 6 months, temporarily improved with prednisone use. She has been taking prednisone as needed to combat flares.   She will experience her symptoms primarily to her left toes which get red and painful. This most recent flare caused pain to her left heel and posterior ankle which was different.  Uric acid level of 9.0 in April 2021, active gout symptoms at the time. She has no gout symptoms today, has never been on gout preventative treatment.   2) Vaginal Itching/Dysuria: Acute symptoms for the last 3 days.  She is managed on Doxycycline 100 mg for which she takes daily for prevention of boils, follows with infectious disease.   She is out of her fluconazole for which she needs on occasion for presumed antibiotic induced vaginal yeast infections.  She denies hematuria, pelvic pressure, flank pain.  3) GAD/MDD: Chronic over the last several years, worse in 2021.  She has a significant medical history and has been through a lot of treatment over the last several years.  She is not ready to return to work, is working to apply for disability.  Symptoms  include daily worry, feeling anxious, feeling down/depressed, little motivation to do anything, not wanting to get out of bed.  [place] Visit from 09/28/2020 in [facility] [facility] at [place]  PHQ-9 Total Score 17     GAD 7 : Generalized Anxiety Score 09/28/2020  Nervous, Anxious, on Edge 2  Control/stop worrying 3  Worry too much - different things 3  Trouble relaxing 2  Restless 2  Easily annoyed or irritable 2  Afraid - awful might happen 0  Total GAD 7 Score 14  Anxiety Difficulty Somewhat difficult   She was last evaluated for the symptoms in August 2021, agreed to therapy at that time.  Unfortunately the referral did not go through.  She is still interested in meeting with a therapist, is also considering medication treatment.  Review of Systems  Constitutional: Negative for fever.  Genitourinary: Positive for dysuria. Negative for flank pain, frequency, hematuria, pelvic pain and vaginal discharge.       Vaginal itching.  Musculoskeletal: Positive for arthralgias.  Psychiatric/Behavioral: The patient is nervous/anxious.        See HPI       Past Medical History:  Diagnosis Date  . Allergy   . Anemia    Normocytic  . Anxiety   . Asthma   . Blood dyscrasia   . Bronchitis 2005  . 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 Diabetes mellitus without complication ([facility])   . Family history of breast cancer   . Family history of lung cancer   . Family history of prostate  cancer   . Gastroenteritis 07/10/07  . GERD 07/24/2006  . GRAVE'S DISEASE 01/01/2008  . History of hidradenitis suppurativa   . History of kidney stones   . History of thrush   . HIV DISEASE 07/24/2006   dx March 05  . Hyperlipidemia   . HYPERTENSION 07/24/2006  . Hyperthyroidism 08/2006   Grave's Disease -diffuse radiotracer uptake 08/25/06 Thyroid scan-Cold nodule to R lower lobe of thyrorid  . Menometrorrhagia    hx of  . Nephrolithiasis   . 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 Personal history of chemotherapy    2020  . Personal history of radiation therapy    2020  . Pneumonia 2005  . 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 Sarcoidosis 02/08/2007   dx as a teenager in [place] from abnl CXR. Completed 2 yrs Prednisone after lung bx confirmation. No symptoms since then.  . Suppurative hidradenitis   . Thyroid cancer ([place])   . THYROID NODULE, RIGHT 02/08/2007     Social History   Socioeconomic History  . Marital status: Single    Spouse name: Not on file  . Number of children: 1  . Years of education: Not on file  . Highest education level: Not on file  Occupational History  . Occupation: Secondary school teacher  Tobacco Use  . Smoking status: Former Smoker    Packs/day: 0.50    Years: 15.00    Pack years: 7.50    Types: Cigarettes    Start date: 04/12/2017  . Smokeless tobacco: Never Used  . Tobacco comment: quit smoking  may 2018  Vaping Use  . Vaping Use: Never used  Substance and Sexual Activity  . Alcohol use: Yes    Alcohol/week: 0.0 standard drinks    Comment: social  . Drug use: No  . Sexual activity: Not Currently    Birth control/protection: Post-menopausal    Comment: declined condoms  Other Topics Concern  . Not on file  Social History Narrative  . Not on file   Social Determinants of Health   Financial Resource Strain: Not on file  Food Insecurity: Not on file  Transportation Needs: Not on file  Physical Activity: Not on file  Stress: Not on file  Social Connections: Not on file  Intimate Partner Violence: Not on file    Past Surgical History:  Procedure Laterality Date  . BREAST EXCISIONAL BIOPSY Right 04/26/2018   right axilla negative  . BREAST EXCISIONAL BIOPSY Left 04/26/2018   left axilla negative  . BREAST LUMPECTOMY Right 10/03/2018   malignant  . BREAST LUMPECTOMY WITH RADIOACTIVE SEED AND SENTINEL LYMPH NODE BIOPSY Right 10/03/2018    Procedure: RIGHT BREAST LUMPECTOMY WITH RADIOACTIVE SEED AND SENTINEL LYMPH NODE MAPPING;  Surgeon: Erroll Luna, MD;  Location: [facility] [place];  Service: General;  Laterality: Right;  . BREAST SURGERY  1997   Breast Reduction   . CYSTOSCOPY W/ URETERAL STENT REMOVAL  11/09/2012   Procedure: CYSTOSCOPY WITH STENT REMOVAL;  Surgeon: Alexis Frock, MD;  Location: [facility];  Service: Urology;  Laterality: Right;  . CYSTOSCOPY WITH RETROGRADE PYELOGRAM, URETEROSCOPY AND STENT PLACEMENT  11/09/2012   Procedure: CYSTOSCOPY WITH RETROGRADE PYELOGRAM, URETEROSCOPY AND STENT PLACEMENT;  Surgeon: Alexis Frock, MD;  Location: [facility];  Service: Urology;  Laterality: Left;  LEFT URETEROSCOPY, STONE MANIPULATION, left STENT exchange   . CYSTOSCOPY WITH STENT PLACEMENT  10/02/2012   Procedure: CYSTOSCOPY WITH STENT PLACEMENT;  Surgeon: Alexis Frock, MD;  Location:  [facility];  Service: Urology;  Laterality: Left;  . DILATION AND CURETTAGE OF UTERUS  Feb 2004   s/p for 1st trimester nonviable pregnancy  . EYE SURGERY     sty under eyelid  . INCISE AND DRAIN ABCESS  Nov 03   s/p I &D for righ inframmary fold hidradenitis  . INCISION AND DRAINAGE PERITONSILLAR ABSCESS  Mar 03  . IR CV LINE INJECTION  06/07/2018  . IR IMAGING GUIDED PORT INSERTION  06/20/2018  . IR REMOVAL TUN ACCESS W/ PORT W/O FL MOD SED  06/20/2018  . IRRIGATION AND DEBRIDEMENT ABSCESS  01/31/2012   Procedure: IRRIGATION AND DEBRIDEMENT ABSCESS;  Surgeon: Shann Medal, MD;  Location: [facility];  Service: General;  Laterality: Right;  right breast and axilla   . NEPHROLITHOTOMY  10/02/2012   Procedure: NEPHROLITHOTOMY PERCUTANEOUS;  Surgeon: Alexis Frock, MD;  [facility];  Service: Urology;  Laterality: Right;  First Stage Percutaneous Nephrolithotomy with Surgeon Access, Left Ureteral Stent    . NEPHROLITHOTOMY  10/04/2012   Procedure: NEPHROLITHOTOMY PERCUTANEOUS SECOND LOOK;  Surgeon: Alexis Frock, MD;  [facility];  Service: Urology;   Laterality: Right;     . NEPHROLITHOTOMY  10/08/2012   Procedure: NEPHROLITHOTOMY PERCUTANEOUS;  Surgeon: Alexis Frock, MD;  [facility];  Service: Urology;  Laterality: Right;  THIRD STAGE, nephrostomy tube exchange x 2  . NEPHROLITHOTOMY  10/11/2012   Procedure: NEPHROLITHOTOMY PERCUTANEOUS SECOND LOOK;  Surgeon: Alexis Frock, MD;  [facility];  Service: Urology;  Laterality: Right;  RIGHT 4 STAGE PERCUTANOUS NEPHROLITHOTOMY, right URETEROSCOPY WITH HOLMIUM LASER   . PORT-A-CATH REMOVAL N/A 07/16/2020   Procedure: REMOVAL PORT-A-CATH;  Surgeon: Erroll Luna, MD;  Location: [facility];  Service: General;  Laterality: N/A;  . PORTACATH PLACEMENT Left 05/17/2018   Procedure: INSERTION PORT-A-CATH;  Surgeon: Coralie Keens, MD;  Location: [place];  Service: General;  Laterality: Left;  . RADICAL NECK DISSECTION  04/12/2017   limited/notes 04/12/2017  . RADICAL NECK DISSECTION N/A 04/12/2017   Procedure: RADICAL NECK DISSECTION;  Surgeon: Melida Quitter, MD;  Location: [facility];  Service: ENT;  Laterality: N/A;  limited neck dissection 2 hours total  . REDUCTION MAMMAPLASTY Bilateral 1998  . RIGHT/LEFT HEART CATH AND CORONARY ANGIOGRAPHY N/A 03/12/2020   Procedure: RIGHT/LEFT HEART CATH AND CORONARY ANGIOGRAPHY;  Surgeon: Jolaine Artist, MD;  Location: [facility];  Service: Cardiovascular;  Laterality: N/A;  . [place]  1994  . THYROIDECTOMY  04/12/2017   completion/notes 04/12/2017  . THYROIDECTOMY N/A 04/12/2017   Procedure: THYROIDECTOMY;  Surgeon: Melida Quitter, MD;  Location: [facility];  Service: ENT;  Laterality: N/A;  Completion Thyroidectomy  . TOTAL THYROIDECTOMY  2010    Family History  Problem Relation Age of Onset  . Hypertension Mother   . Cancer Mother        laryngeal  . Heart disease Mother        stent  . Hypertension Father   . Lung cancer Father 60       hx smoking  . Heart disease Other   . Hypertension Other   .  Stroke Other        Grandparent  . Kidney disease Other        Grandparent  . Diabetes Other        FH of Diabetes  . Hypertension Sister   . Cancer Maternal Uncle        Lung CA  . Hypertension  Brother   . Hypertension Sister   . Breast cancer Maternal Aunt 65  . Breast cancer Paternal Aunt 16  . Prostate cancer Paternal Uncle   . Breast cancer Maternal Aunt        dx 60+  . Breast cancer Paternal Aunt        dx 39's  . Breast cancer Paternal Aunt        dx 59's  . Prostate cancer Paternal Uncle   . Lung cancer Paternal Uncle   . Breast cancer Cousin 72  . Breast cancer Cousin        dx <50  . Breast cancer Cousin        dx <50  . Breast cancer Cousin        dx <50  . Colon cancer Neg Hx   . Esophageal cancer Neg Hx   . Rectal cancer Neg Hx   . Stomach cancer Neg Hx     Allergies  Allergen Reactions  . Genvoya [Elviteg-Cobic-Emtricit-Tenofaf] Hives  . Lisinopril Cough  . Valsartan Other (See Comments) and Cough    Pt states she tolerates medicine now    Current Outpatient Medications on File Prior to Visit  Medication Sig Dispense Refill  . bictegravir-emtricitabine-tenofovir AF (BIKTARVY) 50-200-25 MG TABS tablet Take 1 tablet by mouth daily. 30 tablet 11  . Blood Glucose Monitoring Suppl (ONETOUCH VERIO) w/Device KIT USE AS INSTRUCTED TO TEST BLOOD SUGAR UP TO 4 TIMES DAILY E11.9 1 kit 0  . carvedilol (COREG) 25 MG tablet Take 1 tablet (25 mg total) by mouth 2 (two) times daily. 180 tablet 3  . doxycycline (VIBRA-TABS) 100 MG tablet doxycycline hyclate 100 mg tablet    . empagliflozin (JARDIANCE) 10 MG TABS tablet Take 1 tablet (10 mg total) by mouth daily before breakfast. 30 tablet 6  . FLOVENT HFA 110 MCG/ACT inhaler TAKE 1 PUFF BY MOUTH TWICE A DAY (Patient taking differently: Inhale 1 puff into the lungs daily as needed (Shortness of breath).) 36 Inhaler 1  . gabapentin (NEURONTIN) 300 MG capsule Take 1 capsule (300 mg total) by mouth 3 (three) times daily.  (Patient taking differently: Take 300 mg by mouth 2 (two) times daily.) 90 capsule 3  . glipiZIDE (GLUCOTROL) 5 MG tablet TAKE 1 TABLET (5 MG TOTAL) BY MOUTH 2 (TWO) TIMES DAILY BEFORE A MEAL. FOR DIABETES. 180 tablet 1  . glucose blood (ONETOUCH VERIO) test strip USE AS INSTRUCTED TO TEST BLOOD SUGAR DAILY 100 each 2  . irbesartan-hydrochlorothiazide (AVALIDE) 150-12.5 MG tablet TAKE 1 TABLET BY MOUTH EVERY DAY FOR BLOOD PRESSURE 90 tablet 2  . Lancets (ONETOUCH ULTRASOFT) lancets Use as instructed to test blood sugar daily 100 each 5  . rosuvastatin (CRESTOR) 10 MG tablet TAKE 1 TABLET BY MOUTH EVERY DAY FOR CHOLESTEROL (Patient taking differently: Take 10 mg by mouth daily. FOR CHOLESTEROL) 90 tablet 3  . spironolactone (ALDACTONE) 25 MG tablet Take 1 tablet (25 mg total) by mouth daily. 90 tablet 3  . SYNTHROID 112 MCG tablet Take 2 tablets (224 mcg total) by mouth daily before breakfast. 60 tablet 5  . tamoxifen (NOLVADEX) 20 MG tablet TAKE 1 TABLET BY MOUTH EVERY DAY 90 tablet 4  . TRULICITY 9.38 BO/1.7PZ SOPN INJECT CONTENTS OF 1 PEN (0.$Remove'75MG'stqsSYB$ ) INTO THE SKIN ONCE WEEKLY FOR DIABETES 1.5 mL 3  . [DISCONTINUED] prochlorperazine (COMPAZINE) 10 MG tablet TAKE 1 TABLET BY MOUTH EVERY 6 HOURS AS NEEDED FOR NAUSEA OR VOMITING 30 tablet 2  Current Facility-Administered Medications on File Prior to Visit  Medication Dose Route Frequency Provider Last Rate Last Admin  . sodium chloride flush (NS) 0.9 % injection 10 mL  10 mL Intracatheter PRN Magrinat, Virgie Dad, MD   10 mL at 09/19/18 1551    BP (!) 154/88   Pulse 77   Temp 98.1 F (36.7 C) (Temporal)   Ht $R'5\' 7"'sD$  (1.702 m)   Wt 235 lb (106.6 kg)   LMP 03/31/2014   SpO2 100%   BMI 36.81 kg/m    Objective:   Physical Exam Pulmonary:     Effort: Pulmonary effort is normal.  Genitourinary:    Labia:        Right: No tenderness or lesion.        Left: No tenderness or lesion.      Vagina: Vaginal discharge present. No erythema.      Cervix: No cervical motion tenderness.     Uterus: Normal.      Comments: Scant amount of whitish discharge from vagina. Neurological:     Mental Status: She is alert.            Assessment & Plan:

## 2020-09-28 NOTE — Assessment & Plan Note (Signed)
A1c today improved to 7.7.  Continue Jardiance 10 mg, glipizide 5 mg twice daily, Trulicity 7.32 mg weekly

## 2020-09-28 NOTE — Patient Instructions (Addendum)
Stop by the lab prior to leaving today. I will notify you of your results once received.   Start Bactrim DS (sulfamethoxazole/trimethoprim) tablets for urinary tract infection. Take 1 tablet by mouth twice daily for 3 days.  Start sertraline (Zoloft) 50 mg daily for anxiety and depression. Start by taking 1/2 tablet daily for 6-8 days, then increase to 1 full tablet thereafter.   You will be contacted regarding your referral to therapy.  Please let us know if you have not been contacted within two weeks.   You can take the fluconazole tablet if needed for vaginal itching/yeast infection.  Please schedule a follow up visit for 6 weeks for follow up of anxiety/depression.  It was a pleasure to see you today!

## 2020-09-28 NOTE — Assessment & Plan Note (Addendum)
Acute symptoms for the last 3 days. UA today with 2+ leuks, 3+ blood, 3+ glucose. Culture sent.  Will start treatment with Bactrim double strength tablets for now.  Vaginal wet prep pending.

## 2020-09-28 NOTE — Assessment & Plan Note (Signed)
5-6 recurring episodes over the last 6 months.  Needs daily preventative treatment, may unable to start allopurinol given CKD.  Repeat BMP pending today.  Consider alternative daily preventative treatment if we cannot start allopurinol.  Colchicine interacts with carvedilol, so this is not an option.  She cannot take NSAIDs due to CKD. Would rather she not use prednisone each time for flares given history of diabetes, but this may be our only option.  Await BMP, will initiate daily preventative treatment once we have a better idea of her current kidney function.

## 2020-09-29 ENCOUNTER — Telehealth: Payer: Self-pay | Admitting: Primary Care

## 2020-09-29 LAB — WET PREP BY MOLECULAR PROBE
Candida species: NOT DETECTED
Gardnerella vaginalis: NOT DETECTED
MICRO NUMBER:: 11309767
SPECIMEN QUALITY:: ADEQUATE
Trichomonas vaginosis: NOT DETECTED

## 2020-09-29 LAB — CYTOLOGY - PAP
Comment: NEGATIVE
Diagnosis: NEGATIVE
High risk HPV: NEGATIVE

## 2020-09-29 LAB — URINE CULTURE
MICRO NUMBER:: 11309772
Result:: NO GROWTH
SPECIMEN QUALITY:: ADEQUATE

## 2020-09-29 NOTE — Telephone Encounter (Signed)
-----   Message from Chauncey Cruel, MD sent at 09/28/2020  5:54 PM EST ----- Regarding: RE: Depression The interaction between tamoxifen and some antidepressants including zoloft is real 9there is a change in the endoxifen metabolite) but it is of no clinical significance. The TAN and the antidepressant work just Armed forces logistics/support/administrative officer. I have not been able to update pharmacists about this  Feel free to put her on zoloft while on TAM  Thanks!  GM ----- Message ----- From: [name], NP Sent: 09/28/2020   1:28 PM EST To: Chauncey Cruel, MD Subject: Depression                                     Dr. [name],  I evaluated this patient of ours this morning and am working to treat her anxiety and depression. I would like to put he on Zoloft, I received an warning regarding a potential interaction between her tamoxifen and Zoloft.  Are you aware of any such interaction?  I'd really like to treat her symptoms, she's really struggling, but wanted to know if I needed to go a different route or if you believe that this would be safe.  I appreciate your time! Allie Bossier, NP-C  [place]

## 2020-09-29 NOTE — Telephone Encounter (Signed)
See result note. Will continue with Zoloft as planned.

## 2020-10-01 ENCOUNTER — Other Ambulatory Visit: Payer: Self-pay | Admitting: Primary Care

## 2020-10-01 DIAGNOSIS — M1A09X0 Idiopathic chronic gout, multiple sites, without tophus (tophi): Secondary | ICD-10-CM

## 2020-10-01 MED ORDER — ALLOPURINOL 100 MG PO TABS: 50.0000 mg | ORAL_TABLET | ORAL | 0 refills | Status: DC

## 2020-10-02 ENCOUNTER — Telehealth: Payer: Self-pay | Admitting: *Deleted

## 2020-10-02 NOTE — Telephone Encounter (Signed)
-----   Message from Skeet Latch, MD sent at 10/01/2020  6:16 PM EST ----- Regarding: RE: Recurrent Gout Hey [name],  That's tough.  Let's stop the HCTZ and add amlodipine 5mg .  Thanks for letting me know.  Rip Harbour, can you call [name] and let her know please?  Thanks, Tiffany C. Oval Linsey, MD, [facility]   ----- Message ----- From: [name], NP Sent: 09/29/2020   4:01 PM EST To: Skeet Latch, MD Subject: Recurrent Gout                                 Dr. Oval Linsey,  I evaluated this patient yesterday for symptoms of recurrent gout, she has had 5-6 flares over the last six months. Given her complex medical history I would genuinely appreciate your time!   1. BP always seems uncontrolled, and the HCTZ isn't helping with her gout flares. I would like to stop HCTZ, but wasn't sure what you'd like to do with her regimen? Increase irbesartan to max dose? I'm not sure if this increase would get her to goal.   2. I could certainly put her on daily gout preventative treatment, but given her CKD with recent creatinine of 2, I cannot initiate allopurinol. CKD, another reason to discontinue HCTZ?  What are your thoughts regarding Uloric for gout prevention in her case? The [place] boxed warning of concern which  is why I'm trying to eliminate all potential causes for gout flares.  I'm not convinced that she's doing to change her diet, she's promised this to me over and over again.  She's a tough case, I appreciate any thoughts!  Thank you! Allie Bossier, NP-C [place]

## 2020-10-02 NOTE — Telephone Encounter (Signed)
Left message to call back  

## 2020-10-05 ENCOUNTER — Telehealth: Payer: Self-pay | Admitting: *Deleted

## 2020-10-05 NOTE — Telephone Encounter (Signed)
Patient left a voicemail stating that she started with cold symptoms Thursday/Friday. Patient stated that she is feeling worse and requested a call back.  Tried to call patient back and got a message that she is not accepting phone calls at this time and to call back. Will try to call patient back later.

## 2020-10-05 NOTE — Telephone Encounter (Signed)
Tried to call patient back again and got the same message that customer is not accepting phone calls at this time and to try the call again later.

## 2020-10-05 NOTE — Telephone Encounter (Signed)
Tried to call patient and got the same message customer not accepting calls at this time.

## 2020-10-05 NOTE — Telephone Encounter (Signed)
Tried to call patient again and got the same message "The person you are trying to call is not accepting any calls at this time and to try your call later"

## 2020-10-05 NOTE — Telephone Encounter (Signed)
Noted. Joellen, FYI.

## 2020-10-07 ENCOUNTER — Ambulatory Visit
Admission: EM | Admit: 2020-10-07 | Discharge: 2020-10-07 | Disposition: A | Discharge status: Home / Self Care | Payer: 59 | Attending: Emergency Medicine | Admitting: Emergency Medicine

## 2020-10-07 ENCOUNTER — Other Ambulatory Visit: Payer: Self-pay | Admitting: Internal Medicine

## 2020-10-07 ENCOUNTER — Other Ambulatory Visit: Payer: Self-pay | Admitting: Primary Care

## 2020-10-07 ENCOUNTER — Other Ambulatory Visit: Payer: Self-pay

## 2020-10-07 DIAGNOSIS — Z20822 Contact with and (suspected) exposure to covid-19: Secondary | ICD-10-CM

## 2020-10-07 DIAGNOSIS — J069 Acute upper respiratory infection, unspecified: Secondary | ICD-10-CM

## 2020-10-07 DIAGNOSIS — E890 Postprocedural hypothyroidism: Secondary | ICD-10-CM

## 2020-10-07 DIAGNOSIS — M1A09X0 Idiopathic chronic gout, multiple sites, without tophus (tophi): Secondary | ICD-10-CM

## 2020-10-07 MED ORDER — BENZONATATE 100 MG PO CAPS: 100.0000 mg | ORAL_CAPSULE | Freq: Three times a day (TID) | ORAL | 0 refills | Status: DC

## 2020-10-07 MED ORDER — ALBUTEROL SULFATE HFA 108 (90 BASE) MCG/ACT IN AERS: 2.0000 | INHALATION_SPRAY | Freq: Four times a day (QID) | RESPIRATORY_TRACT | 2 refills | Status: DC | PRN

## 2020-10-07 MED ORDER — AEROCHAMBER PLUS FLO-VU MEDIUM MISC: 1.0000 | Freq: Once | 0 refills | Status: AC

## 2020-10-07 MED ORDER — PREDNISONE 50 MG PO TABS: 50.0000 mg | ORAL_TABLET | Freq: Every day | ORAL | 0 refills | Status: DC

## 2020-10-07 NOTE — Discharge Instructions (Addendum)
Tessalon for cough. Start flonase, atrovent nasal spray for nasal congestion/drainage. You can use over the counter nasal saline rinse such as neti pot for nasal congestion. Keep hydrated, your urine should be clear to pale yellow in color. Tylenol/motrin for fever and pain. Monitor for any worsening of symptoms, chest pain, shortness of breath, wheezing, swelling of the throat, go to the emergency department for further evaluation needed.  

## 2020-10-07 NOTE — Telephone Encounter (Signed)
Tried to call patient again and she answered the phone. Patient stated that she has been having problems with her phone. Patient stated that she started Thursday with symptoms. Patient stated that she started with runny nose and head congestion. Patient stated now she has a bad cough, difficulty breathing and some wheezing. Patient stated that she had some Amoxicillin on hand and has taken that for about 4 days. Patient stated that she has an appointment scheduled later today for a covid test. Patient was advised with her symptoms she should have a face to face evaluation so that someone can listen to her lungs. Patient was given information on the UC at [facility]. Patient was advised that they do have x-ray equipment if they feel that she should have a chest x-ray. Patient was advised if she goes to the UC they can test for her covid also. Patient was given ER precautions and she verbalized understanding. Patient stated that she will go to the UC at  [facility] today.

## 2020-10-07 NOTE — ED Triage Notes (Signed)
Pt c/o cough, nasal congestion, and sore throat since last Thursday. States now having chest congestion, back pain, wheezing, and SOB since Monday.  No distress.

## 2020-10-07 NOTE — ED Provider Notes (Signed)
[facility]    CSN: [id number] Arrival date & time: 10/07/20  1403      History   Chief Complaint Chief Complaint  Patient presents with  . Cough    HPI [name] is a 54 y.o. female  Presenting for URI symptoms since Thursday.  Endorsing dry cough, nasal congestion, sore throat.  Has noted wheezing as well.  Has taken Flovent with some relief.  Does not currently have albuterol inhaler, spacer.  Has felt increased effort of breathing at home.  No known sick contacts.  Past Medical History:  Diagnosis Date  . Allergy   . Anemia    Normocytic  . Anxiety   . Asthma   . Blood dyscrasia   . Bronchitis 2005  . 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 Diabetes mellitus without complication ([place])   . Family history of breast cancer   . Family history of lung cancer   . Family history of prostate cancer   . Gastroenteritis 07/10/07  . GERD 07/24/2006  . GRAVE'S DISEASE 01/01/2008  . History of hidradenitis suppurativa   . History of kidney stones   . History of thrush   . HIV DISEASE 07/24/2006   dx March 05  . Hyperlipidemia   . HYPERTENSION 07/24/2006  . Hyperthyroidism 08/2006   Grave's Disease -diffuse radiotracer uptake 08/25/06 Thyroid scan-Cold nodule to R lower lobe of thyrorid  . Menometrorrhagia    hx of  . Nephrolithiasis   . 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 Personal history of chemotherapy    2020  . Personal history of radiation therapy    2020  . Pneumonia 2005  . 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 Sarcoidosis 02/08/2007   dx as a teenager in [place] from abnl CXR. Completed 2 yrs Prednisone after lung bx confirmation. No symptoms since then.  . Suppurative hidradenitis   . Thyroid cancer ([place])   . THYROID NODULE, RIGHT 02/08/2007    Patient Active Problem List   Diagnosis Date Noted  . Antibiotic-induced yeast infection 09/28/2020  . Dysuria  09/28/2020  . Impingement syndrome of left shoulder region 05/27/2020  . Panniculitis 05/12/2020  . Inclusion cyst 05/01/2020  . Sebaceous cyst 04/21/2020  . Postoperative breast asymmetry 02/28/2020  . Cognitive dysfunction 02/21/2020  . Chronic gout 01/27/2020  . Hematuria 12/10/2019  . 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 Bursitis of left shoulder 09/06/2019  . Pain in joint of left shoulder 09/05/2019  . Dysphagia 07/23/2019  . CKD stage 3 due to type 2 diabetes mellitus ([place]) 07/12/2019  . Normocytic anemia 07/12/2019  . Preventative health care 09/10/2018  . Genetic testing 07/25/2018  . Port-A-Cath in place 07/12/2018  . Family history of breast cancer   . Family history of prostate cancer   . Family history of lung cancer   . Malignant neoplasm of lower-inner quadrant of right breast of female, estrogen receptor positive ([place]) 05/10/2018  . Hyperlipidemia 04/26/2017  . GAD (generalized anxiety disorder) 04/11/2017  . Mass of left side of neck 11/24/2016  . Laryngopharyngeal reflux (LPR) 11/24/2016  . Upper airway cough syndrome 11/11/2016  . Morbid (severe) obesity due to excess calories ([place]) 10/14/2016  . Allergic rhinitis 02/18/2016  . Non-insulin dependent type 2 diabetes mellitus ([place]) 06/23/2015  . Renal calculi 10/29/2014  . Recurrent boils 06/11/2014  . Hidradenitis suppurativa of right >> left axilla 01/30/2012  . 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 Sarcoidosis 02/08/2007  .  Cigarette smoker 02/08/2007  . HIV disease ([place]) 07/24/2006  . Depression 07/24/2006  . Essential hypertension 07/24/2006  . Asthma 07/24/2006  . GERD 07/24/2006    Past Surgical History:  Procedure Laterality Date  . BREAST EXCISIONAL BIOPSY Right 04/26/2018   right axilla negative  . BREAST EXCISIONAL BIOPSY Left 04/26/2018   left axilla negative  . BREAST LUMPECTOMY Right 10/03/2018   malignant  . BREAST LUMPECTOMY WITH RADIOACTIVE SEED AND  SENTINEL LYMPH NODE BIOPSY Right 10/03/2018   Procedure: RIGHT BREAST LUMPECTOMY WITH RADIOACTIVE SEED AND SENTINEL LYMPH NODE MAPPING;  Surgeon: Erroll Luna, MD;  Location: [facility];  Service: General;  Laterality: Right;  . BREAST SURGERY  1997   Breast Reduction   . CYSTOSCOPY W/ URETERAL STENT REMOVAL  11/09/2012   Procedure: CYSTOSCOPY WITH STENT REMOVAL;  Surgeon: Alexis Frock, MD;  Location: [facility];  Service: Urology;  Laterality: Right;  . CYSTOSCOPY WITH RETROGRADE PYELOGRAM, URETEROSCOPY AND STENT PLACEMENT  11/09/2012   Procedure: CYSTOSCOPY WITH RETROGRADE PYELOGRAM, URETEROSCOPY AND STENT PLACEMENT;  Surgeon: Alexis Frock, MD;  Location: [facility];  Service: Urology;  Laterality: Left;  LEFT URETEROSCOPY, STONE MANIPULATION, left STENT exchange   . CYSTOSCOPY WITH STENT PLACEMENT  10/02/2012   Procedure: CYSTOSCOPY WITH STENT PLACEMENT;  Surgeon: Alexis Frock, MD;  Location: [facility];  Service: Urology;  Laterality: Left;  . DILATION AND CURETTAGE OF UTERUS  Feb 2004   s/p for 1st trimester nonviable pregnancy  . EYE SURGERY     sty under eyelid  . INCISE AND DRAIN ABCESS  Nov 03   s/p I &D for righ inframmary fold hidradenitis  . INCISION AND DRAINAGE PERITONSILLAR ABSCESS  Mar 03  . IR CV LINE INJECTION  06/07/2018  . IR IMAGING GUIDED PORT INSERTION  06/20/2018  . IR REMOVAL TUN ACCESS W/ PORT W/O FL MOD SED  06/20/2018  . IRRIGATION AND DEBRIDEMENT ABSCESS  01/31/2012   Procedure: IRRIGATION AND DEBRIDEMENT ABSCESS;  Surgeon: Shann Medal, MD;  Location: [facility];  Service: General;  Laterality: Right;  right breast and axilla   . NEPHROLITHOTOMY  10/02/2012   Procedure: NEPHROLITHOTOMY PERCUTANEOUS;  Surgeon: Alexis Frock, MD;  Location: [facility];  Service: Urology;  Laterality: Right;  First Stage Percutaneous Nephrolithotomy with Surgeon Access, Left Ureteral Stent    . NEPHROLITHOTOMY  10/04/2012   Procedure: NEPHROLITHOTOMY PERCUTANEOUS SECOND LOOK;  Surgeon: Alexis Frock, MD;  [facility];  Service: Urology;  Laterality: Right;     . NEPHROLITHOTOMY  10/08/2012   Procedure: NEPHROLITHOTOMY PERCUTANEOUS;  Surgeon: Alexis Frock, MD;  Location[facility];  Service: Urology;  Laterality: Right;  THIRD STAGE, nephrostomy tube exchange x 2  . NEPHROLITHOTOMY  10/11/2012   Procedure: NEPHROLITHOTOMY PERCUTANEOUS SECOND LOOK;  Surgeon: Alexis Frock, MD;  Location[facility];  Service: Urology;  Laterality: Right;  RIGHT 4 STAGE PERCUTANOUS NEPHROLITHOTOMY, right URETEROSCOPY WITH HOLMIUM LASER   . PORT-A-CATH REMOVAL N/A 07/16/2020   Procedure: REMOVAL PORT-A-CATH;  Surgeon: Erroll Luna, MD;  Location: [facility];  Service: General;  Laterality: N/A;  . PORTACATH PLACEMENT Left 05/17/2018   Procedure: INSERTION PORT-A-CATH;  Surgeon: Coralie Keens, MD;  Location: [facility];  Service: General;  Laterality: Left;  . RADICAL NECK DISSECTION  04/12/2017   limited/notes 04/12/2017  . RADICAL NECK DISSECTION N/A 04/12/2017   Procedure: RADICAL NECK DISSECTION;  Surgeon: Melida Quitter, MD;  Location: [facility];  Service: ENT;  Laterality: N/A;  limited neck dissection 2 hours  total  . REDUCTION MAMMAPLASTY Bilateral 1998  . RIGHT/LEFT HEART CATH AND CORONARY ANGIOGRAPHY N/A 03/12/2020   Procedure: RIGHT/LEFT HEART CATH AND CORONARY ANGIOGRAPHY;  Surgeon: Jolaine Artist, MD;  Location: [facility];  Service: Cardiovascular;  Laterality: N/A;  . [place]  1994  . THYROIDECTOMY  04/12/2017   completion/notes 04/12/2017  . THYROIDECTOMY N/A 04/12/2017   Procedure: THYROIDECTOMY;  Surgeon: Melida Quitter, MD;  Location: [facility];  Service: ENT;  Laterality: N/A;  Completion Thyroidectomy  . TOTAL THYROIDECTOMY  2010    OB History   No obstetric history on file.      Home Medications    Prior to Admission medications   Medication Sig Start Date End Date Taking? Authorizing Provider  albuterol (VENTOLIN HFA) 108 (90 Base)  MCG/ACT inhaler Inhale 2 puffs into the lungs every 6 (six) hours as needed for wheezing or shortness of breath. 10/07/20   Hall-Potvin, [place], PA-C  allopurinol (ZYLOPRIM) 100 MG tablet Take 0.5 tablets (50 mg total) by mouth every other day. For gout prevention. 10/01/20   [name], NP  benzonatate (TESSALON) 100 MG capsule Take 1 capsule (100 mg total) by mouth every 8 (eight) hours. 10/07/20   Hall-Potvin, [place], PA-C  bictegravir-emtricitabine-tenofovir AF (BIKTARVY) 50-200-25 MG TABS tablet Take 1 tablet by mouth daily. 09/18/20   Kuppelweiser, Cassie L, RPH-CPP  Blood Glucose Monitoring Suppl (ONETOUCH VERIO) w/Device KIT USE AS INSTRUCTED TO TEST BLOOD SUGAR UP TO 4 TIMES DAILY E11.9 07/25/19   [name], NP  carvedilol (COREG) 25 MG tablet Take 1 tablet (25 mg total) by mouth 2 (two) times daily. 08/13/20 11/11/20  Skeet Latch, MD  empagliflozin (JARDIANCE) 10 MG TABS tablet Take 1 tablet (10 mg total) by mouth daily before breakfast. 07/08/20   Bensimhon, Shaune Pascal, MD  FLOVENT HFA 110 MCG/ACT inhaler TAKE 1 PUFF BY MOUTH TWICE A DAY Patient taking differently: Inhale 1 puff into the lungs daily as needed (Shortness of breath). 08/27/19   [name], NP  gabapentin (NEURONTIN) 300 MG capsule Take 1 capsule (300 mg total) by mouth 3 (three) times daily. Patient taking differently: Take 300 mg by mouth 2 (two) times daily. 02/21/20   Vaslow, Acey Lav, MD  glipiZIDE (GLUCOTROL) 5 MG tablet TAKE 1 TABLET (5 MG TOTAL) BY MOUTH 2 (TWO) TIMES DAILY BEFORE A MEAL. FOR DIABETES. 08/18/20   [name], NP  glucose blood [facility] VERIO) test strip USE AS INSTRUCTED TO TEST BLOOD SUGAR DAILY 07/23/19   [name], NP  irbesartan-hydrochlorothiazide (AVALIDE) 150-12.5 MG tablet TAKE 1 TABLET BY MOUTH EVERY DAY FOR BLOOD PRESSURE 04/10/20   [name], NP  Lancets [facility] ULTRASOFT) lancets Use as instructed to test blood sugar daily 07/25/19   [name], NP  predniSONE (DELTASONE) 50 MG tablet Take 1 tablet (50 mg total) by mouth daily with breakfast. 10/07/20   Hall-Potvin, [place], PA-C  rosuvastatin (CRESTOR) 10 MG tablet TAKE 1 TABLET BY MOUTH EVERY DAY FOR CHOLESTEROL Patient taking differently: Take 10 mg by mouth daily. FOR CHOLESTEROL 01/27/20   [name], NP  sertraline (ZOLOFT) 50 MG tablet Take 1 tablet (50 mg total) by mouth daily. For anxiety and depression. 09/28/20   [name], NP  Spacer/Aero-Holding Chambers (AEROCHAMBER PLUS FLO-VU MEDIUM) MISC 1 each by Other route once for 1 dose. 10/07/20 10/07/20  Hall-Potvin, [place], PA-C  spironolactone (ALDACTONE) 25 MG tablet Take 1 tablet (25 mg total) by mouth  daily. 09/18/20   Bensimhon, Shaune Pascal, MD  sulfamethoxazole-trimethoprim (BACTRIM DS) 800-160 MG tablet Take 1 tablet by mouth 2 (two) times daily. For urinary tract infection. 09/28/20   [name], NP  SYNTHROID 112 MCG tablet Take 2 tablets (224 mcg total) by mouth daily before breakfast. 07/21/20   Renato Shin, MD  tamoxifen (NOLVADEX) 20 MG tablet TAKE 1 TABLET BY MOUTH EVERY DAY 09/16/20   Causey, Charlestine Massed, NP  TRULICITY 7.86 VE/7.2CN SOPN INJECT CONTENTS OF 1 PEN (0.$Remove'75MG'VfRNZXR$ ) INTO THE SKIN ONCE WEEKLY FOR DIABETES 06/18/20   [name], NP  prochlorperazine (COMPAZINE) 10 MG tablet TAKE 1 TABLET BY MOUTH EVERY 6 HOURS AS NEEDED FOR NAUSEA OR VOMITING 08/24/18 12/27/18  Magrinat, Virgie Dad, MD    Family History Family History  Problem Relation Age of Onset  . Hypertension Mother   . Cancer Mother        laryngeal  . Heart disease Mother        stent  . Hypertension Father   . Lung cancer Father 37       hx smoking  . Heart disease Other   . Hypertension Other   . Stroke Other        Grandparent  . Kidney disease Other        Grandparent  . Diabetes Other        FH of Diabetes  . Hypertension Sister   . Cancer Maternal Uncle        Lung CA  . Hypertension Brother    . Hypertension Sister   . Breast cancer Maternal Aunt 65  . Breast cancer Paternal Aunt 67  . Prostate cancer Paternal Uncle   . Breast cancer Maternal Aunt        dx 60+  . Breast cancer Paternal Aunt        dx 82's  . Breast cancer Paternal Aunt        dx 38's  . Prostate cancer Paternal Uncle   . Lung cancer Paternal Uncle   . Breast cancer Cousin 69  . Breast cancer Cousin        dx <50  . Breast cancer Cousin        dx <50  . Breast cancer Cousin        dx <50  . Colon cancer Neg Hx   . Esophageal cancer Neg Hx   . Rectal cancer Neg Hx   . Stomach cancer Neg Hx     Social History Social History   Tobacco Use  . Smoking status: Former Smoker    Packs/day: 0.50    Years: 15.00    Pack years: 7.50    Types: Cigarettes    Start date: 04/12/2017  . Smokeless tobacco: Never Used  . Tobacco comment: quit smoking  may 2018  Vaping Use  . Vaping Use: Never used  Substance Use Topics  . Alcohol use: Yes    Alcohol/week: 0.0 standard drinks    Comment: social  . Drug use: No     Allergies   Genvoya [elviteg-cobic-emtricit-tenofaf], Lisinopril, and Valsartan   Review of Systems Review of Systems  Constitutional: Negative for fatigue and fever.  HENT: Positive for congestion. Negative for dental problem, ear pain, facial swelling, hearing loss, sinus pain, sore throat, trouble swallowing and voice change.   Eyes: Negative for photophobia, pain and visual disturbance.  Respiratory: Positive for cough and wheezing. Negative for shortness of breath.   Cardiovascular: Negative for chest  pain and palpitations.  Gastrointestinal: Negative for diarrhea and vomiting.  Musculoskeletal: Negative for arthralgias and myalgias.  Neurological: Negative for dizziness and headaches.     Physical Exam Triage Vital Signs ED Triage Vitals [10/07/20 1431]  Enc Vitals Group     BP (!) 142/88     Pulse Rate 82     Resp (!) 30     Temp 98.3 F (36.8 C)     Temp Source Oral      SpO2 97 %     Weight      Height      Head Circumference      Peak Flow      Pain Score      Pain Loc      Pain Edu?      Excl. in [place]?    No data found.  Updated Vital Signs BP (!) 142/88   Pulse 82   Temp 98.3 F (36.8 C) (Oral)   Resp (!) 30   LMP 03/31/2014   SpO2 97%   Visual Acuity Right Eye Distance:   Left Eye Distance:   Bilateral Distance:    Right Eye Near:   Left Eye Near:    Bilateral Near:     Physical Exam Constitutional:      General: She is not in acute distress.    Appearance: She is not ill-appearing or diaphoretic.  HENT:     Head: Normocephalic and atraumatic.     Right Ear: Tympanic membrane and ear canal normal.     Left Ear: Tympanic membrane and ear canal normal.     Mouth/Throat:     Mouth: Mucous membranes are moist.     Pharynx: Oropharynx is clear. No oropharyngeal exudate or posterior oropharyngeal erythema.  Eyes:     General: No scleral icterus.    Conjunctiva/sclera: Conjunctivae normal.     Pupils: Pupils are equal, round, and reactive to light.  Neck:     Comments: Trachea midline, negative JVD Cardiovascular:     Rate and Rhythm: Normal rate and regular rhythm.     Heart sounds: No murmur heard. No gallop.   Pulmonary:     Effort: Pulmonary effort is normal. No respiratory distress.     Breath sounds: Wheezing and rhonchi present. No rales.     Comments: Breathing comfortably at bedside.  Patient does have diffuse rhonchi with wheezing.  No prolonged expiratory phase.  Good air entry bilaterally. Musculoskeletal:     Cervical back: Neck supple. No tenderness.  Lymphadenopathy:     Cervical: No cervical adenopathy.  Skin:    Capillary Refill: Capillary refill takes less than 2 seconds.     Coloration: Skin is not jaundiced or pale.     Findings: No rash.  Neurological:     General: No focal deficit present.     Mental Status: She is alert and oriented to person, place, and time.      UC Treatments / Results   Labs (all labs ordered are listed, but only abnormal results are displayed) Labs Reviewed  NOVEL CORONAVIRUS, NAA    EKG   Radiology No results found.  Procedures Procedures (including critical care time)  Medications Ordered in UC Medications - No data to display  Initial Impression / Assessment and Plan / UC Course  I have reviewed the triage vital signs and the nursing notes.  Pertinent labs & imaging results that were available during my care of the patient were reviewed by me and  considered in my medical decision making (see chart for details).     Patient afebrile, nontoxic, with SpO2 97%.  Covid PCR pending.  Patient to quarantine until results are back.  We will treat supportively as outlined below.  Return precautions discussed, patient verbalized understanding and is agreeable to plan. Final Clinical Impressions(s) / UC Diagnoses   Final diagnoses:  Encounter for screening laboratory testing for COVID-19 virus  URI with cough and congestion     Discharge Instructions     Tessalon for cough. Start flonase, atrovent nasal spray for nasal congestion/drainage. You can use over the counter nasal saline rinse such as neti pot for nasal congestion. Keep hydrated, your urine should be clear to pale yellow in color. Tylenol/motrin for fever and pain. Monitor for any worsening of symptoms, chest pain, shortness of breath, wheezing, swelling of the throat, go to the emergency department for further evaluation needed.     ED Prescriptions    Medication Sig Dispense Auth. Provider   predniSONE (DELTASONE) 50 MG tablet Take 1 tablet (50 mg total) by mouth daily with breakfast. 5 tablet Hall-Potvin, [place], PA-C   benzonatate (TESSALON) 100 MG capsule Take 1 capsule (100 mg total) by mouth every 8 (eight) hours. 21 capsule Hall-Potvin, [place], PA-C   albuterol (VENTOLIN HFA) 108 (90 Base) MCG/ACT inhaler Inhale 2 puffs into the lungs every 6 (six) hours as needed for  wheezing or shortness of breath. 8 g Hall-Potvin, [place], PA-C   Spacer/Aero-Holding Chambers (AEROCHAMBER PLUS FLO-VU MEDIUM) MISC 1 each by Other route once for 1 dose. 1 each Hall-Potvin, [place], PA-C     PDMP not reviewed this encounter.   Hall-Potvin, [place], [place] 10/07/20 [phone]

## 2020-10-07 NOTE — Telephone Encounter (Signed)
Noted. We could have done a virtual visit and ordered a chest xray.

## 2020-10-08 ENCOUNTER — Telehealth: Payer: Self-pay | Admitting: Emergency Medicine

## 2020-10-08 MED ORDER — HYDROCODONE-HOMATROPINE 5-1.5 MG/5ML PO SYRP: 5.0000 mL | ORAL_SOLUTION | Freq: Four times a day (QID) | ORAL | 0 refills | Status: AC | PRN

## 2020-10-08 NOTE — Telephone Encounter (Signed)
Requesting liquid cough medication-sent.

## 2020-10-10 LAB — NOVEL CORONAVIRUS, NAA

## 2020-10-12 ENCOUNTER — Other Ambulatory Visit: Payer: Self-pay | Admitting: Internal Medicine

## 2020-10-12 DIAGNOSIS — B20 Human immunodeficiency virus [HIV] disease: Secondary | ICD-10-CM

## 2020-10-13 ENCOUNTER — Other Ambulatory Visit: Payer: Self-pay

## 2020-10-13 ENCOUNTER — Ambulatory Visit (INDEPENDENT_AMBULATORY_CARE_PROVIDER_SITE_OTHER): Payer: 59

## 2020-10-13 ENCOUNTER — Ambulatory Visit
Admission: EM | Admit: 2020-10-13 | Discharge: 2020-10-13 | Disposition: A | Discharge status: Home / Self Care | Payer: 59 | Attending: Emergency Medicine | Admitting: Emergency Medicine

## 2020-10-13 DIAGNOSIS — R059 Cough, unspecified: Secondary | ICD-10-CM

## 2020-10-13 DIAGNOSIS — Z20822 Contact with and (suspected) exposure to covid-19: Secondary | ICD-10-CM

## 2020-10-13 DIAGNOSIS — Z1152 Encounter for screening for COVID-19: Secondary | ICD-10-CM

## 2020-10-13 MED ORDER — HYDROCODONE-HOMATROPINE 5-1.5 MG/5ML PO SYRP: 5.0000 mL | ORAL_SOLUTION | Freq: Every evening | ORAL | 0 refills | Status: AC | PRN

## 2020-10-13 MED ORDER — AZITHROMYCIN 250 MG PO TABS: 250.0000 mg | ORAL_TABLET | Freq: Every day | ORAL | 0 refills | Status: DC

## 2020-10-13 NOTE — ED Triage Notes (Signed)
Pt states seen here last Wednesday for cough, sore throat, and nasal congestion. States told to come back d/t her covid test was inconclusive. States had a neg rapid test on 12/24. States now the cough is worse and having SOB on exertion. States day 10 of sx's.

## 2020-10-13 NOTE — ED Provider Notes (Signed)
[facility]    CSN: [id number] Arrival date & time: 10/13/20  1210      History   Chief Complaint Chief Complaint  Patient presents with  . Cough    HPI [name] is a 54 y.o. female  With extensive history as below presenting for persistent cough and dyspnea.  Patient initially seen for this on 12/22 by me: Given supportive care.  Lab had area with Covid test-inconclusive.  Past Medical History:  Diagnosis Date  . Allergy   . Anemia    Normocytic  . Anxiety   . Asthma   . Blood dyscrasia   . Bronchitis 2005  . 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 Diabetes mellitus without complication (HCC)   . Family history of breast cancer   . Family history of lung cancer   . Family history of prostate cancer   . Gastroenteritis 07/10/07  . GERD 07/24/2006  . GRAVE'S DISEASE 01/01/2008  . History of hidradenitis suppurativa   . History of kidney stones   . History of thrush   . HIV DISEASE 07/24/2006   dx March 05  . Hyperlipidemia   . HYPERTENSION 07/24/2006  . Hyperthyroidism 08/2006   Grave's Disease -diffuse radiotracer uptake 08/25/06 Thyroid scan-Cold nodule to R lower lobe of thyrorid  . Menometrorrhagia    hx of  . Nephrolithiasis   . 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 Personal history of chemotherapy    2020  . Personal history of radiation therapy    2020  . Pneumonia 2005  . 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 Sarcoidosis 02/08/2007   dx as a teenager in [place] from abnl CXR. Completed 2 yrs Prednisone after lung bx confirmation. No symptoms since then.  . Suppurative hidradenitis   . Thyroid cancer (HCC)   . THYROID NODULE, RIGHT 02/08/2007    Patient Active Problem List   Diagnosis Date Noted  . Antibiotic-induced yeast infection 09/28/2020  . Dysuria 09/28/2020  . Impingement syndrome of left shoulder region 05/27/2020  . Panniculitis  05/12/2020  . Inclusion cyst 05/01/2020  . Sebaceous cyst 04/21/2020  . Postoperative breast asymmetry 02/28/2020  . Cognitive dysfunction 02/21/2020  . Chronic gout 01/27/2020  . Hematuria 12/10/2019  . 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 Bursitis of left shoulder 09/06/2019  . Pain in joint of left shoulder 09/05/2019  . Dysphagia 07/23/2019  . CKD stage 3 due to type 2 diabetes mellitus (HCC) 07/12/2019  . Normocytic anemia 07/12/2019  . Preventative health care 09/10/2018  . Genetic testing 07/25/2018  . Port-A-Cath in place 07/12/2018  . Family history of breast cancer   . Family history of prostate cancer   . Family history of lung cancer   . Malignant neoplasm of lower-inner quadrant of right breast of female, estrogen receptor positive (HCC) 05/10/2018  . Hyperlipidemia 04/26/2017  . GAD (generalized anxiety disorder) 04/11/2017  . Mass of left side of neck 11/24/2016  . Laryngopharyngeal reflux (LPR) 11/24/2016  . Upper airway cough syndrome 11/11/2016  . Morbid (severe) obesity due to excess calories (HCC) 10/14/2016  . Allergic rhinitis 02/18/2016  . Non-insulin dependent type 2 diabetes mellitus (HCC) 06/23/2015  . Renal calculi 10/29/2014  . Recurrent boils 06/11/2014  . Hidradenitis suppurativa of right >> left axilla 01/30/2012  . 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 Sarcoidosis 02/08/2007  . Cigarette smoker 02/08/2007  . HIV disease (HCC) 07/24/2006  . Depression 07/24/2006  . Essential  hypertension 07/24/2006  . Asthma 07/24/2006  . GERD 07/24/2006    Past Surgical History:  Procedure Laterality Date  . BREAST EXCISIONAL BIOPSY Right 04/26/2018   right axilla negative  . BREAST EXCISIONAL BIOPSY Left 04/26/2018   left axilla negative  . BREAST LUMPECTOMY Right 10/03/2018   malignant  . BREAST LUMPECTOMY WITH RADIOACTIVE SEED AND SENTINEL LYMPH NODE BIOPSY Right 10/03/2018   Procedure: RIGHT BREAST LUMPECTOMY WITH  RADIOACTIVE SEED AND SENTINEL LYMPH NODE MAPPING;  Surgeon: Harriette Bouillon, MD;  Location: [facility];  Service: General;  Laterality: Right;  . BREAST SURGERY  1997   Breast Reduction   . CYSTOSCOPY W/ URETERAL STENT REMOVAL  11/09/2012   Procedure: CYSTOSCOPY WITH STENT REMOVAL;  Surgeon: Sebastian Ache, MD;  Location: [facility];  Service: Urology;  Laterality: Right;  . CYSTOSCOPY WITH RETROGRADE PYELOGRAM, URETEROSCOPY AND STENT PLACEMENT  11/09/2012   Procedure: CYSTOSCOPY WITH RETROGRADE PYELOGRAM, URETEROSCOPY AND STENT PLACEMENT;  Surgeon: Sebastian Ache, MD;  Location: [facility];  Service: Urology;  Laterality: Left;  LEFT URETEROSCOPY, STONE MANIPULATION, left STENT exchange   . CYSTOSCOPY WITH STENT PLACEMENT  10/02/2012   Procedure: CYSTOSCOPY WITH STENT PLACEMENT;  Surgeon: Sebastian Ache, MD;  Location: [facility];  Service: Urology;  Laterality: Left;  . DILATION AND CURETTAGE OF UTERUS  Feb 2004   s/p for 1st trimester nonviable pregnancy  . EYE SURGERY     sty under eyelid  . INCISE AND DRAIN ABCESS  Nov 03   s/p I &D for righ inframmary fold hidradenitis  . INCISION AND DRAINAGE PERITONSILLAR ABSCESS  Mar 03  . IR CV LINE INJECTION  06/07/2018  . IR IMAGING GUIDED PORT INSERTION  06/20/2018  . IR REMOVAL TUN ACCESS W/ PORT W/O FL MOD SED  06/20/2018  . IRRIGATION AND DEBRIDEMENT ABSCESS  01/31/2012   Procedure: IRRIGATION AND DEBRIDEMENT ABSCESS;  Surgeon: Kandis Cocking, MD;  Location: [facility];  Service: General;  Laterality: Right;  right breast and axilla   . NEPHROLITHOTOMY  10/02/2012   Procedure: NEPHROLITHOTOMY PERCUTANEOUS;  Surgeon: Sebastian Ache, MD;  Location: [facility];  Service: Urology;  Laterality: Right;  First Stage Percutaneous Nephrolithotomy with Surgeon Access, Left Ureteral Stent    . NEPHROLITHOTOMY  10/04/2012   Procedure: NEPHROLITHOTOMY PERCUTANEOUS SECOND LOOK;  Surgeon: Sebastian Ache, MD;  Location: [facility];  Service: Urology;  Laterality: Right;     . NEPHROLITHOTOMY   10/08/2012   Procedure: NEPHROLITHOTOMY PERCUTANEOUS;  Surgeon: Sebastian Ache, MD;  Location: [facility];  Service: Urology;  Laterality: Right;  THIRD STAGE, nephrostomy tube exchange x 2  . NEPHROLITHOTOMY  10/11/2012   Procedure: NEPHROLITHOTOMY PERCUTANEOUS SECOND LOOK;  Surgeon: Sebastian Ache, MD;  Location: [facility];  Service: Urology;  Laterality: Right;  RIGHT 4 STAGE PERCUTANOUS NEPHROLITHOTOMY, right URETEROSCOPY WITH HOLMIUM LASER   . PORT-A-CATH REMOVAL N/A 07/16/2020   Procedure: REMOVAL PORT-A-CATH;  Surgeon: Harriette Bouillon, MD;  Location: [facility];  Service: General;  Laterality: N/A;  . PORTACATH PLACEMENT Left 05/17/2018   Procedure: INSERTION PORT-A-CATH;  Surgeon: Abigail Miyamoto, MD;  Location: [facility];  Service: General;  Laterality: Left;  . RADICAL NECK DISSECTION  04/12/2017   limited/notes 04/12/2017  . RADICAL NECK DISSECTION N/A 04/12/2017   Procedure: RADICAL NECK DISSECTION;  Surgeon: Christia Reading, MD;  Location: [facility];  Service: ENT;  Laterality: N/A;  limited neck dissection 2 hours total  . REDUCTION MAMMAPLASTY Bilateral 1998  . RIGHT/LEFT HEART CATH AND CORONARY ANGIOGRAPHY N/A  03/12/2020   Procedure: RIGHT/LEFT HEART CATH AND CORONARY ANGIOGRAPHY;  Surgeon: Jolaine Artist, MD;  Location: [facility];  Service: Cardiovascular;  Laterality: N/A;  . [place]  1994  . THYROIDECTOMY  04/12/2017   completion/notes 04/12/2017  . THYROIDECTOMY N/A 04/12/2017   Procedure: THYROIDECTOMY;  Surgeon: Melida Quitter, MD;  Location: [facility];  Service: ENT;  Laterality: N/A;  Completion Thyroidectomy  . TOTAL THYROIDECTOMY  2010    OB History   No obstetric history on file.      Home Medications    Prior to Admission medications   Medication Sig Start Date End Date Taking? Authorizing Provider  azithromycin (ZITHROMAX) 250 MG tablet Take 1 tablet (250 mg total) by mouth daily. Take first 2 tablets together, then 1 every day until  finished. 10/13/20  Yes Hall-Potvin, [place], PA-C  HYDROcodone-homatropine (HYCODAN) 5-1.5 MG/5ML syrup Take 5 mLs by mouth at bedtime as needed for up to 5 days for cough. 10/13/20 10/18/20 Yes Hall-Potvin, [place], PA-C  albuterol (VENTOLIN HFA) 108 (90 Base) MCG/ACT inhaler Inhale 2 puffs into the lungs every 6 (six) hours as needed for wheezing or shortness of breath. 10/07/20   Hall-Potvin, [place], PA-C  allopurinol (ZYLOPRIM) 100 MG tablet Take 0.5 tablets (50 mg total) by mouth every other day. For gout prevention. 10/01/20   [name], NP  bictegravir-emtricitabine-tenofovir AF (BIKTARVY) 50-200-25 MG TABS tablet Take 1 tablet by mouth daily. 09/18/20   Kuppelweiser, Cassie L, RPH-CPP  Blood Glucose Monitoring Suppl (ONETOUCH VERIO) w/Device KIT USE AS INSTRUCTED TO TEST BLOOD SUGAR UP TO 4 TIMES DAILY E11.9 07/25/19   [name], NP  carvedilol (COREG) 25 MG tablet Take 1 tablet (25 mg total) by mouth 2 (two) times daily. 08/13/20 11/11/20  Skeet Latch, MD  empagliflozin (JARDIANCE) 10 MG TABS tablet Take 1 tablet (10 mg total) by mouth daily before breakfast. 07/08/20   Bensimhon, Shaune Pascal, MD  FLOVENT HFA 110 MCG/ACT inhaler TAKE 1 PUFF BY MOUTH TWICE A DAY Patient taking differently: Inhale 1 puff into the lungs daily as needed (Shortness of breath). 08/27/19   [name], NP  gabapentin (NEURONTIN) 300 MG capsule Take 1 capsule (300 mg total) by mouth 3 (three) times daily. Patient taking differently: Take 300 mg by mouth 2 (two) times daily. 02/21/20   Vaslow, Acey Lav, MD  glipiZIDE (GLUCOTROL) 5 MG tablet TAKE 1 TABLET (5 MG TOTAL) BY MOUTH 2 (TWO) TIMES DAILY BEFORE A MEAL. FOR DIABETES. 08/18/20   [name], NP  glucose blood [facility] VERIO) test strip USE AS INSTRUCTED TO TEST BLOOD SUGAR DAILY 07/23/19   [name], NP  irbesartan-hydrochlorothiazide (AVALIDE) 150-12.5 MG tablet TAKE 1 TABLET BY MOUTH EVERY DAY FOR BLOOD PRESSURE 04/10/20    [name], NP  Lancets [facility] ULTRASOFT) lancets Use as instructed to test blood sugar daily 07/25/19   [name], NP  predniSONE (DELTASONE) 50 MG tablet Take 1 tablet (50 mg total) by mouth daily with breakfast. 10/07/20   Hall-Potvin, [place], PA-C  rosuvastatin (CRESTOR) 10 MG tablet TAKE 1 TABLET BY MOUTH EVERY DAY FOR CHOLESTEROL Patient taking differently: Take 10 mg by mouth daily. FOR CHOLESTEROL 01/27/20   [name], NP  sertraline (ZOLOFT) 50 MG tablet Take 1 tablet (50 mg total) by mouth daily. For anxiety and depression. 09/28/20   [name], NP  spironolactone (ALDACTONE) 25 MG tablet Take 1 tablet (25 mg total) by mouth daily. 09/18/20  Bensimhon, Shaune Pascal, MD  sulfamethoxazole-trimethoprim (BACTRIM DS) 800-160 MG tablet Take 1 tablet by mouth 2 (two) times daily. For urinary tract infection. 09/28/20   [name], NP  SYNTHROID 112 MCG tablet Take 2 tablets (224 mcg total) by mouth daily before breakfast. 07/21/20   Renato Shin, MD  tamoxifen (NOLVADEX) 20 MG tablet TAKE 1 TABLET BY MOUTH EVERY DAY 09/16/20   Causey, Charlestine Massed, NP  TRULICITY 2.35 TI/1.4ER SOPN INJECT CONTENTS OF 1 PEN (0.$Remove'75MG'LUelDzA$ ) INTO THE SKIN ONCE WEEKLY FOR DIABETES 06/18/20   [name], NP  prochlorperazine (COMPAZINE) 10 MG tablet TAKE 1 TABLET BY MOUTH EVERY 6 HOURS AS NEEDED FOR NAUSEA OR VOMITING 08/24/18 12/27/18  Magrinat, Virgie Dad, MD    Family History Family History  Problem Relation Age of Onset  . Hypertension Mother   . Cancer Mother        laryngeal  . Heart disease Mother        stent  . Hypertension Father   . Lung cancer Father 53       hx smoking  . Heart disease Other   . Hypertension Other   . Stroke Other        Grandparent  . Kidney disease Other        Grandparent  . Diabetes Other        FH of Diabetes  . Hypertension Sister   . Cancer Maternal Uncle        Lung CA  . Hypertension Brother   . Hypertension Sister   .  Breast cancer Maternal Aunt 65  . Breast cancer Paternal Aunt 32  . Prostate cancer Paternal Uncle   . Breast cancer Maternal Aunt        dx 60+  . Breast cancer Paternal Aunt        dx 27's  . Breast cancer Paternal Aunt        dx 56's  . Prostate cancer Paternal Uncle   . Lung cancer Paternal Uncle   . Breast cancer Cousin 21  . Breast cancer Cousin        dx <50  . Breast cancer Cousin        dx <50  . Breast cancer Cousin        dx <50  . Colon cancer Neg Hx   . Esophageal cancer Neg Hx   . Rectal cancer Neg Hx   . Stomach cancer Neg Hx     Social History Social History   Tobacco Use  . Smoking status: Former Smoker    Packs/day: 0.50    Years: 15.00    Pack years: 7.50    Types: Cigarettes    Start date: 04/12/2017  . Smokeless tobacco: Never Used  . Tobacco comment: quit smoking  may 2018  Vaping Use  . Vaping Use: Never used  Substance Use Topics  . Alcohol use: Yes    Alcohol/week: 0.0 standard drinks    Comment: social  . Drug use: No     Allergies   Genvoya [elviteg-cobic-emtricit-tenofaf], Lisinopril, and Valsartan   Review of Systems Review of Systems  Constitutional: Negative for fatigue and fever.  HENT: Negative for congestion, dental problem, ear pain, facial swelling, hearing loss, sinus pain, sore throat, trouble swallowing and voice change.   Eyes: Negative for photophobia, pain and visual disturbance.  Respiratory: Positive for cough. Negative for shortness of breath and wheezing.   Cardiovascular: Negative for chest pain and palpitations.  Gastrointestinal: Negative  for diarrhea and vomiting.  Musculoskeletal: Negative for arthralgias and myalgias.  Neurological: Negative for dizziness and headaches.     Physical Exam Triage Vital Signs ED Triage Vitals  Enc Vitals Group     BP 10/13/20 1454 (!) 168/83     Pulse Rate 10/13/20 1454 73     Resp 10/13/20 1454 18     Temp 10/13/20 1454 98.1 F (36.7 C)     Temp Source 10/13/20  1454 Oral     SpO2 10/13/20 1454 100 %     Weight --      Height --      Head Circumference --      Peak Flow --      Pain Score 10/13/20 1508 6     Pain Loc --      Pain Edu? --      Excl. in GC? --    No data found.  Updated Vital Signs BP (!) 168/83   Pulse 73   Temp 98.1 F (36.7 C) (Oral)   Resp 18   LMP 03/31/2014   SpO2 100%   Visual Acuity Right Eye Distance:   Left Eye Distance:   Bilateral Distance:    Right Eye Near:   Left Eye Near:    Bilateral Near:     Physical Exam Constitutional:      General: She is not in acute distress.    Appearance: She is not ill-appearing or diaphoretic.  HENT:     Head: Normocephalic and atraumatic.     Right Ear: Tympanic membrane and ear canal normal.     Left Ear: Tympanic membrane and ear canal normal.     Mouth/Throat:     Mouth: Mucous membranes are moist.     Pharynx: Oropharynx is clear. No oropharyngeal exudate or posterior oropharyngeal erythema.  Eyes:     General: No scleral icterus.    Conjunctiva/sclera: Conjunctivae normal.     Pupils: Pupils are equal, round, and reactive to light.  Neck:     Comments: Trachea midline, negative JVD Cardiovascular:     Rate and Rhythm: Normal rate and regular rhythm.     Heart sounds: No murmur heard. No gallop.   Pulmonary:     Effort: Pulmonary effort is normal. No respiratory distress.     Breath sounds: No wheezing, rhonchi or rales.  Musculoskeletal:     Cervical back: Neck supple. No tenderness.  Lymphadenopathy:     Cervical: No cervical adenopathy.  Skin:    Capillary Refill: Capillary refill takes less than 2 seconds.     Coloration: Skin is not jaundiced or pale.     Findings: No rash.  Neurological:     General: No focal deficit present.     Mental Status: She is alert and oriented to person, place, and time.      UC Treatments / Results  Labs (all labs ordered are listed, but only abnormal results are displayed) Labs Reviewed  NOVEL  CORONAVIRUS, NAA    EKG   Radiology DG Chest 2 View  Result Date: 10/13/2020 CLINICAL DATA:  Cough for 10 days. EXAM: CHEST - 2 VIEW COMPARISON:  02/12/2020 FINDINGS: Previous left chest port has been removed.The cardiomediastinal contours are normal. Central bronchial thickening. Ill-defined subsegmental opacities in both lower lung zones. Pulmonary vasculature is normal. No consolidation, pleural effusion, or pneumothorax. No acute osseous abnormalities are seen. Surgical clips at the thoracic inlet and right axilla. IMPRESSION: Central bronchial thickening. Vague ill-defined subsegmental opacities  in both lower lung zones, may be atelectasis or pneumonia in the setting of cough. Electronically Signed   By: Keith Rake M.D.   On: 10/13/2020 15:52    Procedures Procedures (including critical care time)  Medications Ordered in UC Medications - No data to display  Initial Impression / Assessment and Plan / UC Course  I have reviewed the triage vital signs and the nursing notes.  Pertinent labs & imaging results that were available during my care of the patient were reviewed by me and considered in my medical decision making (see chart for details).     Patient afebrile, nontoxic, with SpO2 100%.  Chest x-ray with central bronchial thickening and vague ill-defined subsegmental opacities in both lower lung zones.  ? Atelectasis VS pneumonia.  Given age, comorbidities, will provide Z-Pak which patient has tolerated well in the past.  Will defer refill of prednisone.  Patient requesting refill of Hycodan: Limited supply given.  Covid PCR pending.  Patient to quarantine until results are back.  We will treat supportively as outlined below.  Return precautions discussed, patient verbalized understanding and is agreeable to plan. Final Clinical Impressions(s) / UC Diagnoses   Final diagnoses:  Encounter for screening laboratory testing for COVID-19 virus  Cough   Discharge Instructions    None    ED Prescriptions    Medication Sig Dispense Auth. Provider   azithromycin (ZITHROMAX) 250 MG tablet Take 1 tablet (250 mg total) by mouth daily. Take first 2 tablets together, then 1 every day until finished. 6 tablet Hall-Potvin, [place], PA-C   HYDROcodone-homatropine (HYCODAN) 5-1.5 MG/5ML syrup Take 5 mLs by mouth at bedtime as needed for up to 5 days for cough. 25 mL Hall-Potvin, [place], PA-C     I have reviewed the PDMP during this encounter.   Hall-Potvin, [place], [place] 10/13/20 1626

## 2020-10-14 NOTE — Progress Notes (Signed)
..  The following Assist/Replace Program for [place] from [facility] has been terminated due to treatment plan no longer active.  Last DOS:11/04/2019

## 2020-10-15 LAB — NOVEL CORONAVIRUS, NAA: SARS-CoV-2, NAA: NOT DETECTED

## 2020-10-15 LAB — SARS-COV-2, NAA 2 DAY TAT

## 2020-10-19 ENCOUNTER — Ambulatory Visit: Payer: 59

## 2020-10-21 ENCOUNTER — Other Ambulatory Visit: Payer: Self-pay | Admitting: Primary Care

## 2020-10-21 DIAGNOSIS — F411 Generalized anxiety disorder: Secondary | ICD-10-CM

## 2020-10-21 DIAGNOSIS — F331 Major depressive disorder, recurrent, moderate: Secondary | ICD-10-CM

## 2020-10-21 NOTE — Telephone Encounter (Signed)
Please see message and advise.  Thank you. Last fill 09/28/20 #30/1 Last OV 09/28/20

## 2020-10-22 ENCOUNTER — Ambulatory Visit: Payer: 59

## 2020-10-30 ENCOUNTER — Telehealth: Payer: Self-pay | Admitting: Endocrinology

## 2020-10-30 ENCOUNTER — Ambulatory Visit: Payer: 59 | Admitting: Primary Care

## 2020-10-30 DIAGNOSIS — Z0289 Encounter for other administrative examinations: Secondary | ICD-10-CM

## 2020-10-30 NOTE — Telephone Encounter (Signed)
Ok x 1 Needs f/u ov next avail

## 2020-10-30 NOTE — Telephone Encounter (Signed)
Patient never called back, tried patient again Patient does not check her blood pressure at home She is currently not taking the Irbesartan HCT combo that is listed on med list She is not taking HCTZ  Not sure how long she has been off either Will forward to Dr Oval Linsey to clarify exactly what she wants patient to be taking Follow up visit 1/15

## 2020-10-30 NOTE — Telephone Encounter (Signed)
Okay to send in levothyroxine instead of Synthroid?

## 2020-10-30 NOTE — Telephone Encounter (Signed)
REFILL REQUEST  Instead of synthroid can we call in Levothyroxine? (insurance purposes)  PHARMACY  [id number] - [address], [address]  [place], [place] [address]  Phone:  [phone] Fax:  [phone]

## 2020-11-02 MED ORDER — LEVOTHYROXINE SODIUM 112 MCG PO TABS: 112.0000 ug | ORAL_TABLET | Freq: Every day | ORAL | 0 refills | Status: DC

## 2020-11-04 ENCOUNTER — Encounter (HOSPITAL_BASED_OUTPATIENT_CLINIC_OR_DEPARTMENT_OTHER): Payer: 59 | Admitting: Cardiology

## 2020-11-04 NOTE — Telephone Encounter (Signed)
From what I can tell she should be taking what is on her medication list.  Irbesartan, HCTZ, spironolactone, and carvedilol.

## 2020-11-05 ENCOUNTER — Encounter: Payer: Self-pay | Admitting: Primary Care

## 2020-11-05 ENCOUNTER — Other Ambulatory Visit: Payer: Self-pay | Admitting: Internal Medicine

## 2020-11-05 ENCOUNTER — Telehealth: Payer: Self-pay | Admitting: Endocrinology

## 2020-11-05 ENCOUNTER — Ambulatory Visit (INDEPENDENT_AMBULATORY_CARE_PROVIDER_SITE_OTHER): Payer: 59 | Admitting: Primary Care

## 2020-11-05 ENCOUNTER — Other Ambulatory Visit: Payer: Self-pay

## 2020-11-05 ENCOUNTER — Other Ambulatory Visit: Payer: Self-pay | Admitting: Primary Care

## 2020-11-05 VITALS — BP 138/82 | HR 69 | Temp 97.4°F | Ht 67.0 in | Wt 237.0 lb

## 2020-11-05 DIAGNOSIS — M25512 Pain in left shoulder: Secondary | ICD-10-CM | POA: Diagnosis not present

## 2020-11-05 DIAGNOSIS — F411 Generalized anxiety disorder: Secondary | ICD-10-CM

## 2020-11-05 DIAGNOSIS — E890 Postprocedural hypothyroidism: Secondary | ICD-10-CM

## 2020-11-05 DIAGNOSIS — M25532 Pain in left wrist: Secondary | ICD-10-CM

## 2020-11-05 DIAGNOSIS — M1A09X0 Idiopathic chronic gout, multiple sites, without tophus (tophi): Secondary | ICD-10-CM

## 2020-11-05 DIAGNOSIS — R296 Repeated falls: Secondary | ICD-10-CM | POA: Insufficient documentation

## 2020-11-05 DIAGNOSIS — T3695XA Adverse effect of unspecified systemic antibiotic, initial encounter: Secondary | ICD-10-CM

## 2020-11-05 DIAGNOSIS — N183 Chronic kidney disease, stage 3 unspecified: Secondary | ICD-10-CM

## 2020-11-05 DIAGNOSIS — G620 Drug-induced polyneuropathy: Secondary | ICD-10-CM

## 2020-11-05 DIAGNOSIS — F331 Major depressive disorder, recurrent, moderate: Secondary | ICD-10-CM

## 2020-11-05 DIAGNOSIS — E1122 Type 2 diabetes mellitus with diabetic chronic kidney disease: Secondary | ICD-10-CM

## 2020-11-05 DIAGNOSIS — B379 Candidiasis, unspecified: Secondary | ICD-10-CM

## 2020-11-05 DIAGNOSIS — T451X5A Adverse effect of antineoplastic and immunosuppressive drugs, initial encounter: Secondary | ICD-10-CM

## 2020-11-05 HISTORY — DX: Repeated falls: R29.6

## 2020-11-05 LAB — BASIC METABOLIC PANEL
BUN: 26 mg/dL — ABNORMAL HIGH (ref 6–23)
CO2: 27 mEq/L (ref 19–32)
Calcium: 9.1 mg/dL (ref 8.4–10.5)
Chloride: 101 mEq/L (ref 96–112)
Creatinine, Ser: 2.2 mg/dL — ABNORMAL HIGH (ref 0.40–1.20)
GFR: 25.15 mL/min — ABNORMAL LOW (ref >60.00)
Glucose, Bld: 196 mg/dL — ABNORMAL HIGH (ref 70–99)
Potassium: 3.2 mEq/L — ABNORMAL LOW (ref 3.5–5.1)
Sodium: 138 mEq/L (ref 135–145)

## 2020-11-05 LAB — URIC ACID: Uric Acid, Serum: 9.2 mg/dL — ABNORMAL HIGH (ref 2.4–7.0)

## 2020-11-05 MED ORDER — FLUCONAZOLE 150 MG PO TABS: 150.0000 mg | ORAL_TABLET | Freq: Once | ORAL | 0 refills | Status: AC

## 2020-11-05 MED ORDER — CYCLOBENZAPRINE HCL 5 MG PO TABS: 5.0000 mg | ORAL_TABLET | Freq: Two times a day (BID) | ORAL | 0 refills | Status: DC | PRN

## 2020-11-05 NOTE — Assessment & Plan Note (Signed)
Has yet to start Zoloft 50 mg as prescribed.  We did get in contact with her oncologist to reported that she may take Zoloft with her tamoxifen.  Today she does not seem to be as bothered with anxiety depression and does not plan on starting Zoloft at this point., I did recommend she notify me if she plans on starting Zoloft.

## 2020-11-05 NOTE — Patient Instructions (Signed)
Stop by the lab prior to leaving today. I will notify you of your results once received.   You may take the cyclobenzaprine twice daily as needed for stiffness/muscle tightness. This may cause drowsiness.  Please notify me if you decide to start the sertraline (Zoloft) for anxiety and depression.  It was a pleasure to see you today!

## 2020-11-05 NOTE — Assessment & Plan Note (Signed)
Fluconazole provided per patient request. History of antibiotic induced yeast infections.

## 2020-11-05 NOTE — Assessment & Plan Note (Signed)
Neuropathy could very well be contributing to her falls, discussed this today.  She will look into resuming gabapentin.  She will also notify me if she continues to fall.

## 2020-11-05 NOTE — Telephone Encounter (Signed)
Pt called stating she needs a prescription for levothyroxoine. She states she recently had it switched from Synthroid but the correct medication was not at the pharmacy.    PHARMACY:    [id number] [address], [place] Phone:  [phone]  Fax:  [phone]

## 2020-11-05 NOTE — Assessment & Plan Note (Signed)
3 falls occurring within the last month, patient attributes them to neuropathy to lower extremities.  I offered physical therapy, she declines. She will look into resuming her gabapentin.  Exam today without evidence of fractures or abnormalities to joints involved.  Prescription for cyclobenzaprine sent to pharmacy to use if needed for stiffness/muscle spasm

## 2020-11-05 NOTE — Progress Notes (Signed)
Subjective:    Patient ID: [name], female    DOB: 31-Jul-1966, 55 y.o.   MRN: [id number]  HPI  This visit occurred during the SARS-CoV-2 public health emergency.  Safety protocols were in place, including screening questions prior to the visit, additional usage of staff PPE, and extensive cleaning of exam room while observing appropriate contact time as indicated for disinfecting solutions.   Ms. Ridge is a 55 year old female with a significant medical history including HIV, breast cancer, thyroid cancer, asthma, hypertension, gout, CKD, GAD, tobacco abuse who presents today for follow up of gout and depression and anxiety, and a chief complaint of fall.  She is also requesting fluconazole for vaginal itching after completing antibiotics during her last visit.  She was last evaluated on 09/28/20, we discussed numerous topics including:  1) GAD/Depression: Chronic and ongoing, she was unable to connect with therapy. Given ongoing symptoms we initiated Zoloft 50 mg, placed a new referral for therapy, and asked her to follow up today.  Since her last visit she has not started her Zoloft given Covid-19 and pneumonia. She's been too busy to think about anxiety and depression. She has applied for disability and is in phase 3 of this process.  2) Gout: Chronic with recurring episodes (5-6) over the last 6 months. She continued to request prednisone for flares so we brought her in. Colchicine interacted with her carvedilol, and CKD III was problematic for other medications. We decided to start allopurinol 50 mg daily.   Since her last visit she's compliant to her allopurinol and is taking a full 100 mg tablet.  The tablet is difficult to cut in half and often crumbles.  She mostly takes a full tablet minus a few crumble pieces.  Today:  3) Recurrent Falls: Three falls within the last month. Last night was her most recent fall, was walking down the stairs, missed the stairs half way  down, slid down the rest of the stairs on her left side. Today she has pain to her left shoulder, left wrist, left side of her head, left lateral thigh, right great toe.  She can move all joints and extremities, is mostly stiff today.  She denies losing consciousness, her daughter helped her up from the stairs and she went to lay down on the couch.   She suspects that her falls are secondary to neuropathy.  She is not taking gabapentin, but will resume.  BP Readings from Last 3 Encounters:  11/05/20 138/82  10/13/20 (!) 168/83  10/07/20 (!) 142/88     Review of Systems  Constitutional: Negative for fever.  Musculoskeletal: Positive for arthralgias and myalgias.  Psychiatric/Behavioral:       See HPI       Past Medical History:  Diagnosis Date  . Allergy   . Anemia    Normocytic  . Anxiety   . Asthma   . Blood dyscrasia   . Bronchitis 2005  . 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 Diabetes mellitus without complication ([place])   . Family history of breast cancer   . Family history of lung cancer   . Family history of prostate cancer   . Gastroenteritis 07/10/07  . GERD 07/24/2006  . GRAVE'S DISEASE 01/01/2008  . History of hidradenitis suppurativa   . History of kidney stones   . History of thrush   . HIV DISEASE 07/24/2006   dx March 05  . Hyperlipidemia   . HYPERTENSION 07/24/2006  . Hyperthyroidism 08/2006  Grave's Disease -diffuse radiotracer uptake 08/25/06 Thyroid scan-Cold nodule to R lower lobe of thyrorid  . Menometrorrhagia    hx of  . Nephrolithiasis   . 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 Personal history of chemotherapy    2020  . Personal history of radiation therapy    2020  . Pneumonia 2005  . 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 Sarcoidosis 02/08/2007   dx as a teenager in [place] from abnl CXR. Completed 2 yrs Prednisone after lung bx confirmation. No symptoms since  then.  . Suppurative hidradenitis   . Thyroid cancer ([place])   . THYROID NODULE, RIGHT 02/08/2007     Social History   Socioeconomic History  . Marital status: Single    Spouse name: Not on file  . Number of children: 1  . Years of education: Not on file  . Highest education level: Not on file  Occupational History  . Occupation: Secondary school teacher  Tobacco Use  . Smoking status: Former Smoker    Packs/day: 0.50    Years: 15.00    Pack years: 7.50    Types: Cigarettes    Start date: 04/12/2017  . Smokeless tobacco: Never Used  . Tobacco comment: quit smoking  may 2018  Vaping Use  . Vaping Use: Never used  Substance and Sexual Activity  . Alcohol use: Yes    Alcohol/week: 0.0 standard drinks    Comment: social  . Drug use: No  . Sexual activity: Not Currently    Birth control/protection: Post-menopausal    Comment: declined condoms  Other Topics Concern  . Not on file  Social History Narrative  . Not on file   Social Determinants of Health   Financial Resource Strain: Not on file  Food Insecurity: Not on file  Transportation Needs: Not on file  Physical Activity: Not on file  Stress: Not on file  Social Connections: Not on file  Intimate Partner Violence: Not on file    Past Surgical History:  Procedure Laterality Date  . BREAST EXCISIONAL BIOPSY Right 04/26/2018   right axilla negative  . BREAST EXCISIONAL BIOPSY Left 04/26/2018   left axilla negative  . BREAST LUMPECTOMY Right 10/03/2018   malignant  . BREAST LUMPECTOMY WITH RADIOACTIVE SEED AND SENTINEL LYMPH NODE BIOPSY Right 10/03/2018   Procedure: RIGHT BREAST LUMPECTOMY WITH RADIOACTIVE SEED AND SENTINEL LYMPH NODE MAPPING;  Surgeon: Erroll Luna, MD;  Location: [facility];  Service: General;  Laterality: Right;  . BREAST SURGERY  1997   Breast Reduction   . CYSTOSCOPY W/ URETERAL STENT REMOVAL  11/09/2012   Procedure: CYSTOSCOPY WITH STENT REMOVAL;  Surgeon: Alexis Frock, MD;  Location: [facility];   Service: Urology;  Laterality: Right;  . CYSTOSCOPY WITH RETROGRADE PYELOGRAM, URETEROSCOPY AND STENT PLACEMENT  11/09/2012   Procedure: CYSTOSCOPY WITH RETROGRADE PYELOGRAM, URETEROSCOPY AND STENT PLACEMENT;  Surgeon: Alexis Frock, MD;  Location: [facility];  Service: Urology;  Laterality: Left;  LEFT URETEROSCOPY, STONE MANIPULATION, left STENT exchange   . CYSTOSCOPY WITH STENT PLACEMENT  10/02/2012   Procedure: CYSTOSCOPY WITH STENT PLACEMENT;  Surgeon: Alexis Frock, MD;  Location: [facility];  Service: Urology;  Laterality: Left;  . DILATION AND CURETTAGE OF UTERUS  Feb 2004   s/p for 1st trimester nonviable pregnancy  . EYE SURGERY     sty under eyelid  . INCISE AND DRAIN ABCESS  Nov 03   s/p I &D for righ inframmary fold hidradenitis  . INCISION  AND DRAINAGE PERITONSILLAR ABSCESS  Mar 03  . IR CV LINE INJECTION  06/07/2018  . IR IMAGING GUIDED PORT INSERTION  06/20/2018  . IR REMOVAL TUN ACCESS W/ PORT W/O FL MOD SED  06/20/2018  . IRRIGATION AND DEBRIDEMENT ABSCESS  01/31/2012   Procedure: IRRIGATION AND DEBRIDEMENT ABSCESS;  Surgeon: Shann Medal, MD;  [facility]: [facility];  Service: General;  Laterality: Right;  right breast and axilla   . NEPHROLITHOTOMY  10/02/2012   Procedure: NEPHROLITHOTOMY PERCUTANEOUS;  Surgeon: Alexis Frock, MD;  [facility];  Service: Urology;  Laterality: Right;  First Stage Percutaneous Nephrolithotomy with Surgeon Access, Left Ureteral Stent    . NEPHROLITHOTOMY  10/04/2012   Procedure: NEPHROLITHOTOMY PERCUTANEOUS SECOND LOOK;  Surgeon: Alexis Frock, MD;  [facility];  Service: Urology;  Laterality: Right;     . NEPHROLITHOTOMY  10/08/2012   Procedure: NEPHROLITHOTOMY PERCUTANEOUS;  Surgeon: Alexis Frock, MD;  [facility];  Service: Urology;  Laterality: Right;  THIRD STAGE, nephrostomy tube exchange x 2  . NEPHROLITHOTOMY  10/11/2012   Procedure: NEPHROLITHOTOMY PERCUTANEOUS SECOND LOOK;  Surgeon: Alexis Frock, MD;  [facility];   Service: Urology;  Laterality: Right;  RIGHT 4 STAGE PERCUTANOUS NEPHROLITHOTOMY, right URETEROSCOPY WITH HOLMIUM LASER   . PORT-A-CATH REMOVAL N/A 07/16/2020   Procedure: REMOVAL PORT-A-CATH;  Surgeon: Erroll Luna, MD;  Location: [place] [facility];  Service: General;  Laterality: N/A;  . PORTACATH PLACEMENT Left 05/17/2018   Procedure: INSERTION PORT-A-CATH;  Surgeon: Coralie Keens, MD;  Location: [facility];  Service: General;  Laterality: Left;  . RADICAL NECK DISSECTION  04/12/2017   limited/notes 04/12/2017  . RADICAL NECK DISSECTION N/A 04/12/2017   Procedure: RADICAL NECK DISSECTION;  Surgeon: Melida Quitter, MD;  Location: [place];  Service: ENT;  Laterality: N/A;  limited neck dissection 2 hours total  . REDUCTION MAMMAPLASTY Bilateral 1998  . RIGHT/LEFT HEART CATH AND CORONARY ANGIOGRAPHY N/A 03/12/2020   Procedure: RIGHT/LEFT HEART CATH AND CORONARY ANGIOGRAPHY;  Surgeon: Jolaine Artist, MD;  Location: [facility];  Service: Cardiovascular;  Laterality: N/A;  . [place]  1994  . THYROIDECTOMY  04/12/2017   completion/notes 04/12/2017  . THYROIDECTOMY N/A 04/12/2017   Procedure: THYROIDECTOMY;  Surgeon: Melida Quitter, MD;  Location: [place] [place];  Service: ENT;  Laterality: N/A;  Completion Thyroidectomy  . TOTAL THYROIDECTOMY  2010    Family History  Problem Relation Age of Onset  . Hypertension Mother   . Cancer Mother        laryngeal  . Heart disease Mother        stent  . Hypertension Father   . Lung cancer Father 70       hx smoking  . Heart disease Other   . Hypertension Other   . Stroke Other        Grandparent  . Kidney disease Other        Grandparent  . Diabetes Other        FH of Diabetes  . Hypertension Sister   . Cancer Maternal Uncle        Lung CA  . Hypertension Brother   . Hypertension Sister   . Breast cancer Maternal Aunt 65  . Breast cancer Paternal Aunt 81  . Prostate cancer Paternal Uncle   . Breast cancer Maternal  Aunt        dx 60+  . Breast cancer Paternal Aunt        dx 32's  .  Breast cancer Paternal Aunt        dx 36's  . Prostate cancer Paternal Uncle   . Lung cancer Paternal Uncle   . Breast cancer Cousin 19  . Breast cancer Cousin        dx <50  . Breast cancer Cousin        dx <50  . Breast cancer Cousin        dx <50  . Colon cancer Neg Hx   . Esophageal cancer Neg Hx   . Rectal cancer Neg Hx   . Stomach cancer Neg Hx     Allergies  Allergen Reactions  . Genvoya [Elviteg-Cobic-Emtricit-Tenofaf] Hives  . Lisinopril Cough  . Valsartan Other (See Comments) and Cough    Pt states she tolerates medicine now    Current Outpatient Medications on File Prior to Visit  Medication Sig Dispense Refill  . albuterol (VENTOLIN HFA) 108 (90 Base) MCG/ACT inhaler Inhale 2 puffs into the lungs every 6 (six) hours as needed for wheezing or shortness of breath. 8 g 2  . allopurinol (ZYLOPRIM) 100 MG tablet Take 0.5 tablets (50 mg total) by mouth every other day. For gout prevention. 30 tablet 0  . bictegravir-emtricitabine-tenofovir AF (BIKTARVY) 50-200-25 MG TABS tablet Take 1 tablet by mouth daily. 30 tablet 11  . Blood Glucose Monitoring Suppl (ONETOUCH VERIO) w/Device KIT USE AS INSTRUCTED TO TEST BLOOD SUGAR UP TO 4 TIMES DAILY E11.9 1 kit 0  . carvedilol (COREG) 25 MG tablet Take 1 tablet (25 mg total) by mouth 2 (two) times daily. 180 tablet 3  . empagliflozin (JARDIANCE) 10 MG TABS tablet Take 1 tablet (10 mg total) by mouth daily before breakfast. 30 tablet 6  . FLOVENT HFA 110 MCG/ACT inhaler TAKE 1 PUFF BY MOUTH TWICE A DAY (Patient taking differently: Inhale 1 puff into the lungs daily as needed (Shortness of breath).) 36 Inhaler 1  . gabapentin (NEURONTIN) 300 MG capsule Take 1 capsule (300 mg total) by mouth 3 (three) times daily. (Patient taking differently: Take 300 mg by mouth 2 (two) times daily.) 90 capsule 3  . glipiZIDE (GLUCOTROL) 5 MG tablet TAKE 1 TABLET (5 MG TOTAL) BY  MOUTH 2 (TWO) TIMES DAILY BEFORE A MEAL. FOR DIABETES. 180 tablet 1  . glucose blood (ONETOUCH VERIO) test strip USE AS INSTRUCTED TO TEST BLOOD SUGAR DAILY 100 each 2  . irbesartan-hydrochlorothiazide (AVALIDE) 150-12.5 MG tablet TAKE 1 TABLET BY MOUTH EVERY DAY FOR BLOOD PRESSURE 90 tablet 2  . Lancets (ONETOUCH ULTRASOFT) lancets Use as instructed to test blood sugar daily 100 each 5  . levothyroxine (SYNTHROID) 112 MCG tablet Take 1 tablet (112 mcg total) by mouth daily before breakfast. 30 tablet 0  . rosuvastatin (CRESTOR) 10 MG tablet TAKE 1 TABLET BY MOUTH EVERY DAY FOR CHOLESTEROL (Patient taking differently: Take 10 mg by mouth daily. FOR CHOLESTEROL) 90 tablet 3  . spironolactone (ALDACTONE) 25 MG tablet Take 1 tablet (25 mg total) by mouth daily. 90 tablet 3  . tamoxifen (NOLVADEX) 20 MG tablet TAKE 1 TABLET BY MOUTH EVERY DAY 90 tablet 4  . [name] 50 MG tablet Take 50 mg by mouth daily.    . TRULICITY 4.69 GE/9.5MW SOPN INJECT CONTENTS OF 1 PEN (0.$Remove'75MG'BTkWwkf$ ) INTO THE SKIN ONCE WEEKLY FOR DIABETES 1.5 mL 3  . sertraline (ZOLOFT) 50 MG tablet TAKE 1 TABLET (50 MG TOTAL) BY MOUTH DAILY. FOR ANXIETY AND DEPRESSION. (Patient not taking: Reported on 11/05/2020) 90 tablet 1  . [  DISCONTINUED] prochlorperazine (COMPAZINE) 10 MG tablet TAKE 1 TABLET BY MOUTH EVERY 6 HOURS AS NEEDED FOR NAUSEA OR VOMITING 30 tablet 2   Current Facility-Administered Medications on File Prior to Visit  Medication Dose Route Frequency Provider Last Rate Last Admin  . sodium chloride flush (NS) 0.9 % injection 10 mL  10 mL Intracatheter PRN Magrinat, Virgie Dad, MD   10 mL at 09/19/18 1551    BP 138/82   Pulse 69   Temp (!) 97.4 F (36.3 C) (Temporal)   Ht $R'5\' 7"'nn$  (1.702 m)   Wt 237 lb (107.5 kg)   LMP 03/31/2014   SpO2 98%   BMI 37.12 kg/m    Objective:   Physical Exam Constitutional:      Appearance: She is well-nourished.  Cardiovascular:     Rate and Rhythm: Normal rate and regular rhythm.  Pulmonary:      Effort: Pulmonary effort is normal.     Breath sounds: Normal breath sounds.  Musculoskeletal:     Cervical back: Neck supple.     Comments: Overall normal range of motion to left shoulder and left wrist, she does have pain with some movement.  No obvious dislocation or fracture.  She is ambulating well in the clinic today, also able to get onto the exam table without difficulty.  Skin:    General: Skin is warm and dry.  Neurological:     Mental Status: She is alert.  Psychiatric:        Mood and Affect: Mood and affect and mood normal.            Assessment & Plan:

## 2020-11-05 NOTE — Assessment & Plan Note (Addendum)
No flare since last visit and initiation of allopurinol 50 mg.  Unfortunately she cannot cut the tablets in half to make 50 mg so she has been taking 100 mg daily.  We will check BMP today, if stable then will change prescription to reflect 100 mg daily.  Uric acid pending.

## 2020-11-05 NOTE — Assessment & Plan Note (Signed)
Repeat renal function pending today, she sees nephrology and will be evaluated again soon.

## 2020-11-06 ENCOUNTER — Telehealth (HOSPITAL_COMMUNITY): Payer: Self-pay | Admitting: Pharmacist

## 2020-11-06 ENCOUNTER — Other Ambulatory Visit: Payer: Self-pay | Admitting: Primary Care

## 2020-11-06 DIAGNOSIS — E876 Hypokalemia: Secondary | ICD-10-CM

## 2020-11-06 MED ORDER — POTASSIUM CHLORIDE CRYS ER 20 MEQ PO TBCR: 20.0000 meq | EXTENDED_RELEASE_TABLET | Freq: Two times a day (BID) | ORAL | 0 refills | Status: DC

## 2020-11-06 NOTE — Telephone Encounter (Signed)
Received message that patient was having difficulties affording Jardiance. Patient is commercially insured with [employer] so she is eligible for a copay card. Provided copay card information to [facility]. New copay is $10/month. Left VM for patient.   RxBIN: [id number]  RxPCN: [id number]  RxGRP: [id number]  ID: [id number]   Audry Riles, PharmD, BCPS, BCCP, CPP Heart Failure Clinic Pharmacist [phone]

## 2020-11-09 NOTE — Telephone Encounter (Signed)
Patient has appointment with Pharm D tomorrow and will bring medications, machine, and readings to appointment

## 2020-11-09 NOTE — Progress Notes (Signed)
Patient ID: [name]                 DOB: 1965/11/03                      MRN: [id number]     HPI: [name] is a 55 y.o. female referred by Dr. Oval Tate to HTN clinic. PMH includes hypertension, hyperlipidemia, diabetes, graves' disease, asthma, sarcoidosis, and tobacco abuse. Patient presents for BP assessment and medication titration. Medication compliance is confusing. She has been out of her levothyroxine for more then 30 days, not able to monitor her glucose after meter stopped working, and patient is under the impression she is not taking HCTZ, but is taking irbesartan. Current prescription (and available medication bottle) if for irbesartan/HCTZ tablets.  She denies gout pain for few months now. Noted patient is slightly short of breath, but improved since pneumonia treat,ment completed about 4 weeks ago.   Current HTN meds:  Carvedilol 25mg  twice daily (9am and 9pm) Irbesartan/HCTZ daily - 9am  Spironolactone 25mg  daily - 9am Potassium chloride 32mEq  Previously tried:  Lisinopril - cough  BP goal: <130/80  Family History: family history includes  Breast cancer (age of onset: 44) in her cousin; Breast cancer (age of onset: 12) in her paternal aunt; Breast cancer (age of onset: 33) in her maternal aunt; Cancer in her maternal uncle and mother; Diabetes in an other family member; Heart disease in her mother and another family member; Hypertension in her brother, father, mother, sister, sister, and another family member; Kidney disease in an other family member; Lung cancer in her paternal uncle; Lung cancer (age of onset: 79) in her father; Prostate cancer in her paternal uncle and paternal uncle; Stroke in an other family member. There is no history of Colon cancer, Esophageal cancer, Rectal cancer, or Stomach cancer.  Social History: former smoker, social drinking  Diet: decreased appetite after chemotherapy, kidney function deceased, drinks lots of water daily  (oatmeal, eggs, [place] sausage, sandwich)  Exercise: PREP program (no interested)  Home BP readings: none available (no BP cuff at home)  Wt Readings from Last 3 Encounters:  11/10/20 237 lb 12.8 oz (107.9 kg)  11/05/20 237 lb (107.5 kg)  09/28/20 235 lb (106.6 kg)   BP Readings from Last 3 Encounters:  11/10/20 (!) 140/94  11/05/20 138/82  10/13/20 (!) 168/83   Pulse Readings from Last 3 Encounters:  11/10/20 68  11/05/20 69  10/13/20 73    Renal function: Estimated Creatinine Clearance: 36.6 mL/min (A) (by C-G formula based on SCr of 2.2 mg/dL (H)).  Past Medical History:  Diagnosis Date  . Allergy   . Anemia    Normocytic  . Anxiety   . Asthma   . Blood dyscrasia   . Bronchitis 2005  . 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 Diabetes mellitus without complication ([place])   . Family history of breast cancer   . Family history of lung cancer   . Family history of prostate cancer   . Gastroenteritis 07/10/07  . GERD 07/24/2006  . GRAVE'S DISEASE 01/01/2008  . History of hidradenitis suppurativa   . History of kidney stones   . History of thrush   . HIV DISEASE 07/24/2006   dx March 05  . Hyperlipidemia   . HYPERTENSION 07/24/2006  . Hyperthyroidism 08/2006   Grave's Disease -diffuse radiotracer uptake 08/25/06 Thyroid scan-Cold nodule to R lower lobe of thyrorid  . Menometrorrhagia    hx  of  . Nephrolithiasis   . 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 Personal history of chemotherapy    2020  . Personal history of radiation therapy    2020  . Pneumonia 2005  . 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 Sarcoidosis 02/08/2007   dx as a teenager in [place] from abnl CXR. Completed 2 yrs Prednisone after lung bx confirmation. No symptoms since then.  . Suppurative hidradenitis   . Thyroid cancer ([place])   . THYROID NODULE, RIGHT 02/08/2007    Current Outpatient Medications on File Prior to  Visit  Medication Sig Dispense Refill  . allopurinol (ZYLOPRIM) 100 MG tablet TAKE 1/2 TABLETS (50 MG TOTAL) BY MOUTH EVERY OTHER DAY. FOR GOUT PREVENTION. 30 tablet 0  . carvedilol (COREG) 25 MG tablet Take 1 tablet (25 mg total) by mouth 2 (two) times daily. 180 tablet 3  . cyclobenzaprine (FLEXERIL) 5 MG tablet Take 1 tablet (5 mg total) by mouth 2 (two) times daily as needed for muscle spasms. 14 tablet 0  . empagliflozin (JARDIANCE) 10 MG TABS tablet Take 1 tablet (10 mg total) by mouth daily before breakfast. 30 tablet 6  . emtricitabine-tenofovir AF (DESCOVY) 200-25 MG tablet Take 1 tablet by mouth daily.    Marland Kitchen gabapentin (NEURONTIN) 300 MG capsule Take 1 capsule (300 mg total) by mouth 3 (three) times daily. (Patient taking differently: Take 300 mg by mouth 2 (two) times daily.) 90 capsule 3  . glipiZIDE (GLUCOTROL) 5 MG tablet TAKE 1 TABLET (5 MG TOTAL) BY MOUTH 2 (TWO) TIMES DAILY BEFORE A MEAL. FOR DIABETES. 180 tablet 1  . irbesartan-hydrochlorothiazide (AVALIDE) 150-12.5 MG tablet TAKE 1 TABLET BY MOUTH EVERY DAY FOR BLOOD PRESSURE 90 tablet 2  . potassium chloride SA (KLOR-CON) 20 MEQ tablet Take 1 tablet (20 mEq total) by mouth 2 (two) times daily. For low potassium. 10 tablet 0  . rosuvastatin (CRESTOR) 10 MG tablet TAKE 1 TABLET BY MOUTH EVERY DAY FOR CHOLESTEROL (Patient taking differently: Take 10 mg by mouth daily. FOR CHOLESTEROL) 90 tablet 3  . spironolactone (ALDACTONE) 25 MG tablet Take 1 tablet (25 mg total) by mouth daily. 90 tablet 3  . tamoxifen (NOLVADEX) 20 MG tablet TAKE 1 TABLET BY MOUTH EVERY DAY 90 tablet 4  . [name] 50 MG tablet Take 50 mg by mouth daily.    . TRULICITY 1.61 WR/6.0AV SOPN INJECT CONTENTS OF 1 PEN (0.75MG ) INTO THE SKIN ONCE WEEKLY FOR DIABETES 1.5 mL 3  . albuterol (VENTOLIN HFA) 108 (90 Base) MCG/ACT inhaler Inhale 2 puffs into the lungs every 6 (six) hours as needed for wheezing or shortness of breath. (Patient not taking: Reported on 11/10/2020) 8  g 2  . bictegravir-emtricitabine-tenofovir AF (BIKTARVY) 50-200-25 MG TABS tablet Take 1 tablet by mouth daily. (Patient not taking: Reported on 11/10/2020) 30 tablet 11  . FLOVENT HFA 110 MCG/ACT inhaler TAKE 1 PUFF BY MOUTH TWICE A DAY (Patient not taking: Reported on 11/10/2020) 36 Inhaler 1  . glucose blood (ONETOUCH VERIO) test strip USE AS INSTRUCTED TO TEST BLOOD SUGAR DAILY 100 each 2  . Lancets (ONETOUCH ULTRASOFT) lancets Use as instructed to test blood sugar daily 100 each 5  . levothyroxine (SYNTHROID) 112 MCG tablet Take 1 tablet (112 mcg total) by mouth daily before breakfast. (Patient not taking: Reported on 11/10/2020) 30 tablet 0  . sertraline (ZOLOFT) 50 MG tablet TAKE 1 TABLET (50 MG TOTAL) BY MOUTH DAILY. FOR ANXIETY AND  DEPRESSION. (Patient not taking: No sig reported) 90 tablet 1  . [DISCONTINUED] prochlorperazine (COMPAZINE) 10 MG tablet TAKE 1 TABLET BY MOUTH EVERY 6 HOURS AS NEEDED FOR NAUSEA OR VOMITING 30 tablet 2   Current Facility-Administered Medications on File Prior to Visit  Medication Dose Route Frequency Provider Last Rate Last Admin  . sodium chloride flush (NS) 0.9 % injection 10 mL  10 mL Intracatheter PRN Magrinat, Virgie Dad, MD   10 mL at 09/19/18 1551    Allergies  Allergen Reactions  . Genvoya [Elviteg-Cobic-Emtricit-Tenofaf] Hives  . Lisinopril Cough  . Valsartan Other (See Comments) and Cough    Pt states she tolerates medicine now    Blood pressure (!) 140/94, pulse 68, height 5\' 5"  (1.651 m), weight 237 lb 12.8 oz (107.9 kg), last menstrual period 03/31/2014, SpO2 98 %.  Essential hypertension Blood pressure remains above goal, and patient reports compliance with BP medication.  Due to recurrent gout flair up, we will discontinue irbesartan/HCTZ and start candesartan 32mg  daily. Plan to continue carvedilol 25mg  twice daily and spironolactone 25mg  daily.  BP arm cuff was provided today as well. I verify accuracy and review proper technique with  patinet as well. She is to monitor BP twice daily until next OV and bring records to follow up appointment in 4 weeks.  Non-insulin dependent type 2 diabetes mellitus ([place]) Current glucometer is not working. Noted PCP sent Rx for new device. Patient was encouraged to call insurance company to assure affordability. If new device not covered by insurance, she is to call us back for social worker to assist with new device purchase.   Postsurgical hypothyroidism Patient out of levothyroxine for over 30 days. I was able to contact pharmacy and have recent Rx by Dr Loanne Drilling filled. Patient will pick-up today.   Lak[place] Dona Rodriguez-Guzman PharmD, BCPS, [place] [address] 11/12/2020 3:53 PM

## 2020-11-10 ENCOUNTER — Ambulatory Visit (INDEPENDENT_AMBULATORY_CARE_PROVIDER_SITE_OTHER): Payer: 59 | Admitting: Pharmacist

## 2020-11-10 ENCOUNTER — Other Ambulatory Visit: Payer: Self-pay | Admitting: Pharmacist

## 2020-11-10 ENCOUNTER — Other Ambulatory Visit: Payer: Self-pay

## 2020-11-10 VITALS — BP 140/94 | HR 68 | Ht 65.0 in | Wt 237.8 lb

## 2020-11-10 DIAGNOSIS — I10 Essential (primary) hypertension: Secondary | ICD-10-CM

## 2020-11-10 DIAGNOSIS — E890 Postprocedural hypothyroidism: Secondary | ICD-10-CM

## 2020-11-10 DIAGNOSIS — E119 Type 2 diabetes mellitus without complications: Secondary | ICD-10-CM | POA: Diagnosis not present

## 2020-11-10 MED ORDER — CANDESARTAN CILEXETIL 32 MG PO TABS: 32.0000 mg | ORAL_TABLET | Freq: Every day | ORAL | 1 refills | Status: DC

## 2020-11-10 MED ORDER — ONETOUCH VERIO W/DEVICE KIT: PACK | 0 refills | Status: DC

## 2020-11-10 NOTE — Progress Notes (Signed)
CSW was contacted as pt shared that her glucometer had stopped working and PharmD inquiring if we can assist. Per Care Navigation team pt should attempt first to contact her insurance company, and make them aware of machine not working, to see if they will cover a new machine. Raquel, PharmD has sent a new script in and advised pt of the above, she has also let pt know to call our office back if insurance company declines to cover a new machine. If denied we can see how we can assist with covering a new glucometer that works with her current supplies.   Westley Hummer, MSW, [place]  [phone]

## 2020-11-10 NOTE — Patient Instructions (Addendum)
Return for a  follow up appointment in 4 weeks  Go to the lab in 3 weeks  Check your blood pressure at home daily (if able) and keep record of the readings.  Take your BP meds as follows: *STOP taking irbesartan/HCTZ* *START taking candesartan 32mg  daily*   *CONTINUE all other medication as prescribed*  *phone number [phone]  Bring all of your meds, your BP cuff and your record of home blood pressures to your next appointment.  Exercise as you're able, try to walk approximately 30 minutes per day.  Keep salt intake to a minimum, especially watch canned and prepared boxed foods.  Eat more fresh fruits and vegetables and fewer canned items.  Avoid eating in fast food restaurants.    HOW TO TAKE YOUR BLOOD PRESSURE: . Rest 5 minutes before taking your blood pressure. .  Don't smoke or drink caffeinated beverages for at least 30 minutes before. . Take your blood pressure before (not after) you eat. . Sit comfortably with your back supported and both feet on the floor (don't cross your legs). . Elevate your arm to heart level on a table or a desk. . Use the proper sized cuff. It should fit smoothly and snugly around your bare upper arm. There should be enough room to slip a fingertip under the cuff. The bottom edge of the cuff should be 1 inch above the crease of the elbow. . Ideally, take 3 measurements at one sitting and record the average.

## 2020-11-11 ENCOUNTER — Telehealth: Payer: 59

## 2020-11-12 ENCOUNTER — Encounter: Payer: Self-pay | Admitting: Pharmacist

## 2020-11-12 NOTE — Assessment & Plan Note (Signed)
Current glucometer is not working. Noted PCP sent Rx for new device. Patient was encouraged to call insurance company to assure affordability. If new device not covered by insurance, she is to call us back for social worker to assist with new device purchase.

## 2020-11-12 NOTE — Assessment & Plan Note (Signed)
Patient out of levothyroxine for over 30 days. I was able to contact pharmacy and have recent Rx by Dr Loanne Drilling filled. Patient will pick-up today.

## 2020-11-12 NOTE — Assessment & Plan Note (Signed)
Blood pressure remains above goal, and patient reports compliance with BP medication.  Due to recurrent gout flair up, we will discontinue irbesartan/HCTZ and start candesartan 32mg  daily. Plan to continue carvedilol 25mg  twice daily and spironolactone 25mg  daily.  BP arm cuff was provided today as well. I verify accuracy and review proper technique with patinet as well. She is to monitor BP twice daily until next OV and bring records to follow up appointment in 4 weeks.

## 2020-11-16 ENCOUNTER — Other Ambulatory Visit: Payer: Self-pay | Admitting: Primary Care

## 2020-11-16 DIAGNOSIS — E785 Hyperlipidemia, unspecified: Secondary | ICD-10-CM

## 2020-11-16 NOTE — Telephone Encounter (Signed)
Refills sent to pharmacy. 

## 2020-11-20 ENCOUNTER — Telehealth: Payer: Self-pay | Admitting: Pharmacist

## 2020-11-20 NOTE — Telephone Encounter (Signed)
° ° °  Pt said Raquel gave her a machine to check her blood sugar and she needs a prescription so she can get a strip and needles for it.

## 2020-11-24 NOTE — Telephone Encounter (Signed)
Called pt to let her know that she needed to get further diabetes supplies from her pcp. Pt voiced understanding.

## 2020-11-24 NOTE — Telephone Encounter (Signed)
Blood pressure divide was provided during OV.   Rx for lancets and trips for her glucose monitor were sent to pharmacy.   Any additional need for glucose monitoring should come from he PCP.   If patient is requesting new glucometer and insurance will no pay for it, please forward message to social worker for additional assistance.

## 2020-11-25 ENCOUNTER — Other Ambulatory Visit: Payer: Self-pay | Admitting: Primary Care

## 2020-11-25 ENCOUNTER — Other Ambulatory Visit: Payer: Self-pay

## 2020-11-25 DIAGNOSIS — E119 Type 2 diabetes mellitus without complications: Secondary | ICD-10-CM

## 2020-11-25 MED ORDER — ONETOUCH VERIO VI STRP: ORAL_STRIP | 2 refills | Status: DC

## 2020-11-25 MED ORDER — ONETOUCH ULTRASOFT LANCETS MISC: 5 refills | Status: DC

## 2020-11-29 NOTE — Progress Notes (Signed)
ICD-10-CM   1. Panniculitis  M79.3       Patient ID: [name], female    DOB: 06-20-1966, 54 y.o.   MRN: [id number]   History of Present Illness: [name] is a 55 y.o.  female  with a history of panniculitis.  She presents for preoperative evaluation for upcoming procedure, panniculectomy, scheduled for 12/21/2020 with Dr. Marla Roe.  Summary from previous visit: Patient has noticed increase in her back pain over the last couple years attributed to excess abdominal tissue.  She has noticed that even when she loses weight she has a pannus that hangs.  She is 5 feet 5 inches tall and weighs 237 pounds.  She has a history of diabetes; last hemoglobin A1c was 8.0 in April 2021.  No recent hemoglobin A1c.  She has gout and kidney disease.  Her skin folds are deep and she gets rashes and skin breakdown at times.  She has a history of HIV, sarcoidosis, hydradenitis.  PMH Significant for: DM (4/21 - A1c 8.0), GERD, Graves' disease, hidradenitis, HIV, HTN, HLD, Hx chemotherapy and radiation (2020) sarcoidosis, thyroid cancer  The patient has not had problems with anesthesia.   Past Medical History: Allergies: Allergies  Allergen Reactions  . Genvoya [Elviteg-Cobic-Emtricit-Tenofaf] Hives  . Lisinopril Cough  . Valsartan Other (See Comments) and Cough    Pt states she tolerates medicine now    Current Medications:  Current Outpatient Medications:  .  albuterol (VENTOLIN HFA) 108 (90 Base) MCG/ACT inhaler, Inhale 2 puffs into the lungs every 6 (six) hours as needed for wheezing or shortness of breath., Disp: 8 g, Rfl: 2 .  allopurinol (ZYLOPRIM) 100 MG tablet, TAKE 1/2 TABLETS (50 MG TOTAL) BY MOUTH EVERY OTHER DAY. FOR GOUT PREVENTION., Disp: 30 tablet, Rfl: 0 .  bictegravir-emtricitabine-tenofovir AF (BIKTARVY) 50-200-25 MG TABS tablet, Take 1 tablet by mouth daily., Disp: 30 tablet, Rfl: 11 .  Blood Glucose Monitoring Suppl (FREESTYLE LITE) w/Device KIT, Inject 1  each into the skin daily., Disp: 1 kit, Rfl: 0 .  candesartan (ATACAND) 32 MG tablet, Take 1 tablet (32 mg total) by mouth daily., Disp: 30 tablet, Rfl: 1 .  empagliflozin (JARDIANCE) 10 MG TABS tablet, Take 1 tablet (10 mg total) by mouth daily before breakfast., Disp: 30 tablet, Rfl: 6 .  emtricitabine-tenofovir AF (DESCOVY) 200-25 MG tablet, Take 1 tablet by mouth daily., Disp: , Rfl:  .  FLOVENT HFA 110 MCG/ACT inhaler, TAKE 1 PUFF BY MOUTH TWICE A DAY, Disp: 36 Inhaler, Rfl: 1 .  gabapentin (NEURONTIN) 300 MG capsule, Take 1 capsule (300 mg total) by mouth 3 (three) times daily. (Patient taking differently: Take 300 mg by mouth 2 (two) times daily.), Disp: 90 capsule, Rfl: 3 .  glipiZIDE (GLUCOTROL) 5 MG tablet, TAKE 1 TABLET (5 MG TOTAL) BY MOUTH 2 (TWO) TIMES DAILY BEFORE A MEAL. FOR DIABETES., Disp: 180 tablet, Rfl: 1 .  glucose blood (ONETOUCH VERIO) test strip, USE AS INSTRUCTED TO TEST BLOOD SUGAR DAILY, Disp: 100 each, Rfl: 2 .  irbesartan-hydrochlorothiazide (AVALIDE) 150-12.5 MG tablet, TAKE 1 TABLET BY MOUTH EVERY DAY FOR BLOOD PRESSURE, Disp: 90 tablet, Rfl: 2 .  Lancets (ONETOUCH ULTRASOFT) lancets, Use as instructed to test blood sugar daily, Disp: 100 each, Rfl: 5 .  levothyroxine (SYNTHROID) 112 MCG tablet, Take 1 tablet (112 mcg total) by mouth daily before breakfast., Disp: 30 tablet, Rfl: 0 .  potassium chloride SA (KLOR-CON) 20 MEQ tablet, Take 1 tablet (20 mEq  total) by mouth 2 (two) times daily. For low potassium., Disp: 10 tablet, Rfl: 0 .  rosuvastatin (CRESTOR) 10 MG tablet, TAKE 1 TABLET BY MOUTH EVERY DAY FOR CHOLESTEROL, Disp: 90 tablet, Rfl: 3 .  sertraline (ZOLOFT) 50 MG tablet, TAKE 1 TABLET (50 MG TOTAL) BY MOUTH DAILY. FOR ANXIETY AND DEPRESSION., Disp: 90 tablet, Rfl: 1 .  spironolactone (ALDACTONE) 25 MG tablet, Take 1 tablet (25 mg total) by mouth daily., Disp: 90 tablet, Rfl: 3 .  tamoxifen (NOLVADEX) 20 MG tablet, TAKE 1 TABLET BY MOUTH EVERY DAY, Disp: 90  tablet, Rfl: 4 .  [name] 50 MG tablet, Take 50 mg by mouth daily., Disp: , Rfl:  .  TRULICITY 4.92 EF/0.0FH SOPN, INJECT CONTENTS OF 1 PEN (0.$Remove'75MG'ceaicyl$ ) INTO THE SKIN ONCE WEEKLY FOR DIABETES, Disp: 2 mL, Rfl: 3 .  carvedilol (COREG) 25 MG tablet, Take 1 tablet (25 mg total) by mouth 2 (two) times daily., Disp: 180 tablet, Rfl: 3 No current facility-administered medications for this visit.  Facility-Administered Medications Ordered in Other Visits:  .  sodium chloride flush (NS) 0.9 % injection 10 mL, 10 mL, Intracatheter, PRN, Magrinat, Virgie Dad, MD, 10 mL at 09/19/18 1551  Past Medical Problems: Past Medical History:  Diagnosis Date  . Allergy   . Anemia    Normocytic  . Anxiety   . Asthma   . Blood dyscrasia   . Bronchitis 2005  . 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 Diabetes mellitus without complication ([place])   . Family history of breast cancer   . Family history of lung cancer   . Family history of prostate cancer   . Gastroenteritis 07/10/07  . GERD 07/24/2006  . GRAVE'S DISEASE 01/01/2008  . History of hidradenitis suppurativa   . History of kidney stones   . History of thrush   . HIV DISEASE 07/24/2006   dx March 05  . Hyperlipidemia   . HYPERTENSION 07/24/2006  . Hyperthyroidism 08/2006   Grave's Disease -diffuse radiotracer uptake 08/25/06 Thyroid scan-Cold nodule to R lower lobe of thyrorid  . Menometrorrhagia    hx of  . Nephrolithiasis   . 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 Personal history of chemotherapy    2020  . Personal history of radiation therapy    2020  . Pneumonia 2005  . 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 Sarcoidosis 02/08/2007   dx as a teenager in [place] from abnl CXR. Completed 2 yrs Prednisone after lung bx confirmation. No symptoms since then.  . Suppurative hidradenitis   . Thyroid cancer ([place])   . THYROID NODULE, RIGHT 02/08/2007    Past Surgical  History: Past Surgical History:  Procedure Laterality Date  . BREAST EXCISIONAL BIOPSY Right 04/26/2018   right axilla negative  . BREAST EXCISIONAL BIOPSY Left 04/26/2018   left axilla negative  . BREAST LUMPECTOMY Right 10/03/2018   malignant  . BREAST LUMPECTOMY WITH RADIOACTIVE SEED AND SENTINEL LYMPH NODE BIOPSY Right 10/03/2018   Procedure: RIGHT BREAST LUMPECTOMY WITH RADIOACTIVE SEED AND SENTINEL LYMPH NODE MAPPING;  Surgeon: Erroll Luna, MD;  Location: [facility];  Service: General;  Laterality: Right;  . BREAST SURGERY  1997   Breast Reduction   . CYSTOSCOPY W/ URETERAL STENT REMOVAL  11/09/2012   Procedure: CYSTOSCOPY WITH STENT REMOVAL;  Surgeon: Alexis Frock, MD;  Location: [facility];  Service: Urology;  Laterality: Right;  . CYSTOSCOPY WITH RETROGRADE PYELOGRAM, URETEROSCOPY AND STENT PLACEMENT  11/09/2012   Procedure:  CYSTOSCOPY WITH RETROGRADE PYELOGRAM, URETEROSCOPY AND STENT PLACEMENT;  Surgeon: Alexis Frock, MD;  Location: [facility];  Service: Urology;  Laterality: Left;  LEFT URETEROSCOPY, STONE MANIPULATION, left STENT exchange   . CYSTOSCOPY WITH STENT PLACEMENT  10/02/2012   Procedure: CYSTOSCOPY WITH STENT PLACEMENT;  Surgeon: Alexis Frock, MD;  Location: [facility];  Service: Urology;  Laterality: Left;  . DILATION AND CURETTAGE OF UTERUS  Feb 2004   s/p for 1st trimester nonviable pregnancy  . EYE SURGERY     sty under eyelid  . INCISE AND DRAIN ABCESS  Nov 03   s/p I &D for righ inframmary fold hidradenitis  . INCISION AND DRAINAGE PERITONSILLAR ABSCESS  Mar 03  . IR CV LINE INJECTION  06/07/2018  . IR IMAGING GUIDED PORT INSERTION  06/20/2018  . IR REMOVAL TUN ACCESS W/ PORT W/O FL MOD SED  06/20/2018  . IRRIGATION AND DEBRIDEMENT ABSCESS  01/31/2012   Procedure: IRRIGATION AND DEBRIDEMENT ABSCESS;  Surgeon: Shann Medal, MD;  Location: [facility];  Service: General;  Laterality: Right;  right breast and axilla   . NEPHROLITHOTOMY  10/02/2012   Procedure: NEPHROLITHOTOMY  PERCUTANEOUS;  Surgeon: Alexis Frock, MD;  Location[facility];  Service: Urology;  Laterality: Right;  First Stage Percutaneous Nephrolithotomy with Surgeon Access, Left Ureteral Stent    . NEPHROLITHOTOMY  10/04/2012   Procedure: NEPHROLITHOTOMY PERCUTANEOUS SECOND LOOK;  Surgeon: Alexis Frock, MD;  Location[facility];  Service: Urology;  Laterality: Right;     . NEPHROLITHOTOMY  10/08/2012   Procedure: NEPHROLITHOTOMY PERCUTANEOUS;  Surgeon: Alexis Frock, MD;  Location: [facility];  Service: Urology;  Laterality: Right;  THIRD STAGE, nephrostomy tube exchange x 2  . NEPHROLITHOTOMY  10/11/2012   Procedure: NEPHROLITHOTOMY PERCUTANEOUS SECOND LOOK;  Surgeon: Alexis Frock, MD;  Location: [facility];  Service: Urology;  Laterality: Right;  RIGHT 4 STAGE PERCUTANOUS NEPHROLITHOTOMY, right URETEROSCOPY WITH HOLMIUM LASER   . PORT-A-CATH REMOVAL N/A 07/16/2020   Procedure: REMOVAL PORT-A-CATH;  Surgeon: Erroll Luna, MD;  Location: [facility];  Service: General;  Laterality: N/A;  . PORTACATH PLACEMENT Left 05/17/2018   Procedure: INSERTION PORT-A-CATH;  Surgeon: Coralie Keens, MD;  Location: [facility];  Service: General;  Laterality: Left;  . RADICAL NECK DISSECTION  04/12/2017   limited/notes 04/12/2017  . RADICAL NECK DISSECTION N/A 04/12/2017   Procedure: RADICAL NECK DISSECTION;  Surgeon: Melida Quitter, MD;  Location: [facility];  Service: ENT;  Laterality: N/A;  limited neck dissection 2 hours total  . REDUCTION MAMMAPLASTY Bilateral 1998  . RIGHT/LEFT HEART CATH AND CORONARY ANGIOGRAPHY N/A 03/12/2020   Procedure: RIGHT/LEFT HEART CATH AND CORONARY ANGIOGRAPHY;  Surgeon: Jolaine Artist, MD;  Location: [facility];  Service: Cardiovascular;  Laterality: N/A;  . [place]  1994  . THYROIDECTOMY  04/12/2017   completion/notes 04/12/2017  . THYROIDECTOMY N/A 04/12/2017   Procedure: THYROIDECTOMY;  Surgeon: Melida Quitter, MD;  Location: [facility];  Service: ENT;   Laterality: N/A;  Completion Thyroidectomy  . TOTAL THYROIDECTOMY  2010    Social History: Social History   Socioeconomic History  . Marital status: Single    Spouse name: Not on file  . Number of children: 1  . Years of education: Not on file  . Highest education level: Not on file  Occupational History  . Occupation: Secondary school teacher  Tobacco Use  . Smoking status: Former Smoker    Packs/day: 0.50    Years: 15.00    Pack years:  7.50    Types: Cigarettes    Start date: 04/12/2017  . Smokeless tobacco: Never Used  . Tobacco comment: quit smoking  may 2018  Vaping Use  . Vaping Use: Never used  Substance and Sexual Activity  . Alcohol use: Yes    Alcohol/week: 0.0 standard drinks    Comment: social  . Drug use: No  . Sexual activity: Not Currently    Birth control/protection: Post-menopausal    Comment: declined condoms  Other Topics Concern  . Not on file  Social History Narrative  . Not on file   Social Determinants of Health   Financial Resource Strain: Not on file  Food Insecurity: Not on file  Transportation Needs: Not on file  Physical Activity: Not on file  Stress: Not on file  Social Connections: Not on file  Intimate Partner Violence: Not on file    Family History: Family History  Problem Relation Age of Onset  . Hypertension Mother   . Cancer Mother        laryngeal  . Heart disease Mother        stent  . Hypertension Father   . Lung cancer Father 77       hx smoking  . Heart disease Other   . Hypertension Other   . Stroke Other        Grandparent  . Kidney disease Other        Grandparent  . Diabetes Other        FH of Diabetes  . Hypertension Sister   . Cancer Maternal Uncle        Lung CA  . Hypertension Brother   . Hypertension Sister   . Breast cancer Maternal Aunt 65  . Breast cancer Paternal Aunt 35  . Prostate cancer Paternal Uncle   . Breast cancer Maternal Aunt        dx 60+  . Breast cancer Paternal Aunt        dx 90's   . Breast cancer Paternal Aunt        dx 88's  . Prostate cancer Paternal Uncle   . Lung cancer Paternal Uncle   . Breast cancer Cousin 29  . Breast cancer Cousin        dx <50  . Breast cancer Cousin        dx <50  . Breast cancer Cousin        dx <50  . Colon cancer Neg Hx   . Esophageal cancer Neg Hx   . Rectal cancer Neg Hx   . Stomach cancer Neg Hx     Review of Systems: Review of Systems  Constitutional: Negative for chills and fever.  HENT: Negative for congestion and sore throat.   Respiratory: Negative for cough and shortness of breath.   Cardiovascular: Negative for chest pain and palpitations.  Gastrointestinal: Negative for abdominal pain, nausea and vomiting.  Musculoskeletal: Positive for back pain.  Skin: Negative for itching and rash.    Physical Exam: Vital Signs BP (!) 146/86 (BP Location: Left Arm, Patient Position: Sitting, Cuff Size: Large)   Pulse 78   Ht $R'5\' 5"'qs$  (1.651 m)   Wt 238 lb 6.4 oz (108.1 kg)   LMP 03/31/2014   SpO2 98%   BMI 39.67 kg/m  Physical Exam Vitals and nursing note reviewed.  Constitutional:      Appearance: Normal appearance. She is normal weight.  HENT:     Head: Normocephalic and atraumatic.  Eyes:  Extraocular Movements: Extraocular movements intact.  Cardiovascular:     Rate and Rhythm: Normal rate and regular rhythm.     Pulses: Normal pulses.     Heart sounds: Normal heart sounds.  Pulmonary:     Effort: Pulmonary effort is normal.     Breath sounds: Normal breath sounds. No wheezing, rhonchi or rales.  Abdominal:     General: Bowel sounds are normal.     Palpations: Abdomen is soft.  Musculoskeletal:        General: No swelling. Normal range of motion.     Cervical back: Normal range of motion.  Skin:    General: Skin is warm and dry.     Coloration: Skin is not pale.     Findings: No erythema or rash.  Neurological:     General: No focal deficit present.     Mental Status: She is alert and oriented  to person, place, and time.  Psychiatric:        Mood and Affect: Mood normal.        Behavior: Behavior normal.        Thought Content: Thought content normal.        Judgment: Judgment normal.     Assessment/Plan:  Ms. Norell scheduled for panniculectomy with Dr. Marla Roe.  Risks, benefits, and alternatives of procedure discussed, questions answered and consent obtained.    Smoking Status: Former Smoker - quit ~ 2.5 yrs ago; Counseling Given? N/A  Caprini Score: High; Risk Factors include: 55 year old female, Hx thyroid cancer, sarcoidosis, family (mother) hx blood clots, BMI > 25, and length of planned surgery. Recommendation for mechanical and pharmacological prophylaxis during surgery. Encourage early ambulation.   Pictures obtained: 05/12/2020  Post-op Rx sent to pharmacy: Norco, Keflex, Tylenol, Zofran  Patient was provided with the panniculectomy and General Surgical Risk consent document and Pain Medication Agreement prior to their appointment.  They had adequate time to read through the risk consent documents and Pain Medication Agreement. We also discussed them in person together during this preop appointment. All of their questions were answered to their satisfaction.  Recommended calling if they have any further questions.  Risk consent form and Pain Medication Agreement to be scanned into patient's chart.  The risk that can be encountered for this procedure were discussed and include the following but not limited to these: asymmetry, fluid accumulation, firmness of the tissue, skin loss, decrease or no sensation, fat necrosis, bleeding, infection, healing delay.  Deep vein thrombosis, cardiac and pulmonary complications are risks to any procedure.  There are risks of anesthesia, changes to skin sensation and injury to nerves or blood vessels.  The muscle can be temporarily or permanently injured.  You may have an allergic reaction to tape, suture, glue, blood products which can  result in skin discoloration, swelling, pain, skin lesions, poor healing.  Any of these can lead to the need for revisonal surgery or stage procedures.  Weight gain and weigh loss can also effect the long term appearance. The results are not guaranteed to last a lifetime.  Future surgery may be required.    Electronically signed by: Threasa Heads, PA-C 12/01/2020 11:57 AM

## 2020-11-29 NOTE — H&P (View-Only) (Signed)
ICD-10-CM   1. Panniculitis  M79.3       Patient ID: [name], female    DOB: April 08, 1966, 55 y.o.   MRN: [id number]   History of Present Illness: [name] is a 55 y.o.  female  with a history of panniculitis.  She presents for preoperative evaluation for upcoming procedure, panniculectomy, scheduled for 12/21/2020 with Dr. Marla Roe.  Summary from previous visit: Patient has noticed increase in her back pain over the last couple years attributed to excess abdominal tissue.  She has noticed that even when she loses weight she has a pannus that hangs.  She is 5 feet 5 inches tall and weighs 237 pounds.  She has a history of diabetes; last hemoglobin A1c was 8.0 in April 2021.  No recent hemoglobin A1c.  She has gout and kidney disease.  Her skin folds are deep and she gets rashes and skin breakdown at times.  She has a history of HIV, sarcoidosis, hydradenitis.  PMH Significant for: DM (4/21 - A1c 8.0), GERD, Graves' disease, hidradenitis, HIV, HTN, HLD, Hx chemotherapy and radiation (2020) sarcoidosis, thyroid cancer  The patient has not had problems with anesthesia.   Past Medical History: Allergies: Allergies  Allergen Reactions  . Genvoya [Elviteg-Cobic-Emtricit-Tenofaf] Hives  . Lisinopril Cough  . Valsartan Other (See Comments) and Cough    Pt states she tolerates medicine now    Current Medications:  Current Outpatient Medications:  .  albuterol (VENTOLIN HFA) 108 (90 Base) MCG/ACT inhaler, Inhale 2 puffs into the lungs every 6 (six) hours as needed for wheezing or shortness of breath., Disp: 8 g, Rfl: 2 .  allopurinol (ZYLOPRIM) 100 MG tablet, TAKE 1/2 TABLETS (50 MG TOTAL) BY MOUTH EVERY OTHER DAY. FOR GOUT PREVENTION., Disp: 30 tablet, Rfl: 0 .  bictegravir-emtricitabine-tenofovir AF (BIKTARVY) 50-200-25 MG TABS tablet, Take 1 tablet by mouth daily., Disp: 30 tablet, Rfl: 11 .  Blood Glucose Monitoring Suppl (FREESTYLE LITE) w/Device KIT, Inject 1  each into the skin daily., Disp: 1 kit, Rfl: 0 .  candesartan (ATACAND) 32 MG tablet, Take 1 tablet (32 mg total) by mouth daily., Disp: 30 tablet, Rfl: 1 .  empagliflozin (JARDIANCE) 10 MG TABS tablet, Take 1 tablet (10 mg total) by mouth daily before breakfast., Disp: 30 tablet, Rfl: 6 .  emtricitabine-tenofovir AF (DESCOVY) 200-25 MG tablet, Take 1 tablet by mouth daily., Disp: , Rfl:  .  FLOVENT HFA 110 MCG/ACT inhaler, TAKE 1 PUFF BY MOUTH TWICE A DAY, Disp: 36 Inhaler, Rfl: 1 .  gabapentin (NEURONTIN) 300 MG capsule, Take 1 capsule (300 mg total) by mouth 3 (three) times daily. (Patient taking differently: Take 300 mg by mouth 2 (two) times daily.), Disp: 90 capsule, Rfl: 3 .  glipiZIDE (GLUCOTROL) 5 MG tablet, TAKE 1 TABLET (5 MG TOTAL) BY MOUTH 2 (TWO) TIMES DAILY BEFORE A MEAL. FOR DIABETES., Disp: 180 tablet, Rfl: 1 .  glucose blood (ONETOUCH VERIO) test strip, USE AS INSTRUCTED TO TEST BLOOD SUGAR DAILY, Disp: 100 each, Rfl: 2 .  irbesartan-hydrochlorothiazide (AVALIDE) 150-12.5 MG tablet, TAKE 1 TABLET BY MOUTH EVERY DAY FOR BLOOD PRESSURE, Disp: 90 tablet, Rfl: 2 .  Lancets (ONETOUCH ULTRASOFT) lancets, Use as instructed to test blood sugar daily, Disp: 100 each, Rfl: 5 .  levothyroxine (SYNTHROID) 112 MCG tablet, Take 1 tablet (112 mcg total) by mouth daily before breakfast., Disp: 30 tablet, Rfl: 0 .  potassium chloride SA (KLOR-CON) 20 MEQ tablet, Take 1 tablet (20 mEq  total) by mouth 2 (two) times daily. For low potassium., Disp: 10 tablet, Rfl: 0 .  rosuvastatin (CRESTOR) 10 MG tablet, TAKE 1 TABLET BY MOUTH EVERY DAY FOR CHOLESTEROL, Disp: 90 tablet, Rfl: 3 .  sertraline (ZOLOFT) 50 MG tablet, TAKE 1 TABLET (50 MG TOTAL) BY MOUTH DAILY. FOR ANXIETY AND DEPRESSION., Disp: 90 tablet, Rfl: 1 .  spironolactone (ALDACTONE) 25 MG tablet, Take 1 tablet (25 mg total) by mouth daily., Disp: 90 tablet, Rfl: 3 .  tamoxifen (NOLVADEX) 20 MG tablet, TAKE 1 TABLET BY MOUTH EVERY DAY, Disp: 90  tablet, Rfl: 4 .  [name] 50 MG tablet, Take 50 mg by mouth daily., Disp: , Rfl:  .  TRULICITY 1.69 CV/8.9FY SOPN, INJECT CONTENTS OF 1 PEN (0.$Remove'75MG'XFOBmwf$ ) INTO THE SKIN ONCE WEEKLY FOR DIABETES, Disp: 2 mL, Rfl: 3 .  carvedilol (COREG) 25 MG tablet, Take 1 tablet (25 mg total) by mouth 2 (two) times daily., Disp: 180 tablet, Rfl: 3 No current facility-administered medications for this visit.  Facility-Administered Medications Ordered in Other Visits:  .  sodium chloride flush (NS) 0.9 % injection 10 mL, 10 mL, Intracatheter, PRN, Magrinat, Virgie Dad, MD, 10 mL at 09/19/18 1551  Past Medical Problems: Past Medical History:  Diagnosis Date  . Allergy   . Anemia    Normocytic  . Anxiety   . Asthma   . Blood dyscrasia   . Bronchitis 2005  . 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 Diabetes mellitus without complication ([place])   . Family history of breast cancer   . Family history of lung cancer   . Family history of prostate cancer   . Gastroenteritis 07/10/07  . GERD 07/24/2006  . GRAVE'S DISEASE 01/01/2008  . History of hidradenitis suppurativa   . History of kidney stones   . History of thrush   . HIV DISEASE 07/24/2006   dx March 05  . Hyperlipidemia   . HYPERTENSION 07/24/2006  . Hyperthyroidism 08/2006   Grave's Disease -diffuse radiotracer uptake 08/25/06 Thyroid scan-Cold nodule to R lower lobe of thyrorid  . Menometrorrhagia    hx of  . Nephrolithiasis   . 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 Personal history of chemotherapy    2020  . Personal history of radiation therapy    2020  . Pneumonia 2005  . 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 Sarcoidosis 02/08/2007   dx as a teenager in [place] from abnl CXR. Completed 2 yrs Prednisone after lung bx confirmation. No symptoms since then.  . Suppurative hidradenitis   . Thyroid cancer ([place])   . THYROID NODULE, RIGHT 02/08/2007    Past Surgical  History: Past Surgical History:  Procedure Laterality Date  . BREAST EXCISIONAL BIOPSY Right 04/26/2018   right axilla negative  . BREAST EXCISIONAL BIOPSY Left 04/26/2018   left axilla negative  . BREAST LUMPECTOMY Right 10/03/2018   malignant  . BREAST LUMPECTOMY WITH RADIOACTIVE SEED AND SENTINEL LYMPH NODE BIOPSY Right 10/03/2018   Procedure: RIGHT BREAST LUMPECTOMY WITH RADIOACTIVE SEED AND SENTINEL LYMPH NODE MAPPING;  Surgeon: Erroll Luna, MD;  Location: [facility];  Service: General;  Laterality: Right;  . BREAST SURGERY  1997   Breast Reduction   . CYSTOSCOPY W/ URETERAL STENT REMOVAL  11/09/2012   Procedure: CYSTOSCOPY WITH STENT REMOVAL;  Surgeon: Alexis Frock, MD;  Location: [facility];  Service: Urology;  Laterality: Right;  . CYSTOSCOPY WITH RETROGRADE PYELOGRAM, URETEROSCOPY AND STENT PLACEMENT  11/09/2012   Procedure:  CYSTOSCOPY WITH RETROGRADE PYELOGRAM, URETEROSCOPY AND STENT PLACEMENT;  Surgeon: Alexis Frock, MD;  Location: [facility];  Service: Urology;  Laterality: Left;  LEFT URETEROSCOPY, STONE MANIPULATION, left STENT exchange   . CYSTOSCOPY WITH STENT PLACEMENT  10/02/2012   Procedure: CYSTOSCOPY WITH STENT PLACEMENT;  Surgeon: Alexis Frock, MD;  Location: [facility];  Service: Urology;  Laterality: Left;  . DILATION AND CURETTAGE OF UTERUS  Feb 2004   s/p for 1st trimester nonviable pregnancy  . EYE SURGERY     sty under eyelid  . INCISE AND DRAIN ABCESS  Nov 03   s/p I &D for righ inframmary fold hidradenitis  . INCISION AND DRAINAGE PERITONSILLAR ABSCESS  Mar 03  . IR CV LINE INJECTION  06/07/2018  . IR IMAGING GUIDED PORT INSERTION  06/20/2018  . IR REMOVAL TUN ACCESS W/ PORT W/O FL MOD SED  06/20/2018  . IRRIGATION AND DEBRIDEMENT ABSCESS  01/31/2012   Procedure: IRRIGATION AND DEBRIDEMENT ABSCESS;  Surgeon: Shann Medal, MD;  Location: [facility];  Service: General;  Laterality: Right;  right breast and axilla   . NEPHROLITHOTOMY  10/02/2012   Procedure: NEPHROLITHOTOMY  PERCUTANEOUS;  Surgeon: Alexis Frock, MD;  Location: [facility];  Service: Urology;  Laterality: Right;  First Stage Percutaneous Nephrolithotomy with Surgeon Access, Left Ureteral Stent    . NEPHROLITHOTOMY  10/04/2012   Procedure: NEPHROLITHOTOMY PERCUTANEOUS SECOND LOOK;  Surgeon: Alexis Frock, MD;  Location: [facility];  Service: Urology;  Laterality: Right;     . NEPHROLITHOTOMY  10/08/2012   Procedure: NEPHROLITHOTOMY PERCUTANEOUS;  Surgeon: Alexis Frock, MD;  Location: [facility];  Service: Urology;  Laterality: Right;  THIRD STAGE, nephrostomy tube exchange x 2  . NEPHROLITHOTOMY  10/11/2012   Procedure: NEPHROLITHOTOMY PERCUTANEOUS SECOND LOOK;  Surgeon: Alexis Frock, MD;  Location: [facility];  Service: Urology;  Laterality: Right;  RIGHT 4 STAGE PERCUTANOUS NEPHROLITHOTOMY, right URETEROSCOPY WITH HOLMIUM LASER   . PORT-A-CATH REMOVAL N/A 07/16/2020   Procedure: REMOVAL PORT-A-CATH;  Surgeon: Erroll Luna, MD;  Location: [facility];  Service: General;  Laterality: N/A;  . PORTACATH PLACEMENT Left 05/17/2018   Procedure: INSERTION PORT-A-CATH;  Surgeon: Coralie Keens, MD;  Location: [facility];  Service: General;  Laterality: Left;  . RADICAL NECK DISSECTION  04/12/2017   limited/notes 04/12/2017  . RADICAL NECK DISSECTION N/A 04/12/2017   Procedure: RADICAL NECK DISSECTION;  Surgeon: Melida Quitter, MD;  Location: [facility];  Service: ENT;  Laterality: N/A;  limited neck dissection 2 hours total  . REDUCTION MAMMAPLASTY Bilateral 1998  . RIGHT/LEFT HEART CATH AND CORONARY ANGIOGRAPHY N/A 03/12/2020   Procedure: RIGHT/LEFT HEART CATH AND CORONARY ANGIOGRAPHY;  Surgeon: Jolaine Artist, MD;  Location: [facility];  Service: Cardiovascular;  Laterality: N/A;  . [place]  1994  . THYROIDECTOMY  04/12/2017   completion/notes 04/12/2017  . THYROIDECTOMY N/A 04/12/2017   Procedure: THYROIDECTOMY;  Surgeon: Melida Quitter, MD;  Location: [facility];  Service: ENT;   Laterality: N/A;  Completion Thyroidectomy  . TOTAL THYROIDECTOMY  2010    Social History: Social History   Socioeconomic History  . Marital status: Single    Spouse name: Not on file  . Number of children: 1  . Years of education: Not on file  . Highest education level: Not on file  Occupational History  . Occupation: Secondary school teacher  Tobacco Use  . Smoking status: Former Smoker    Packs/day: 0.50    Years: 15.00    Pack years:  7.50    Types: Cigarettes    Start date: 04/12/2017  . Smokeless tobacco: Never Used  . Tobacco comment: quit smoking  may 2018  Vaping Use  . Vaping Use: Never used  Substance and Sexual Activity  . Alcohol use: Yes    Alcohol/week: 0.0 standard drinks    Comment: social  . Drug use: No  . Sexual activity: Not Currently    Birth control/protection: Post-menopausal    Comment: declined condoms  Other Topics Concern  . Not on file  Social History Narrative  . Not on file   Social Determinants of Health   Financial Resource Strain: Not on file  Food Insecurity: Not on file  Transportation Needs: Not on file  Physical Activity: Not on file  Stress: Not on file  Social Connections: Not on file  Intimate Partner Violence: Not on file    Family History: Family History  Problem Relation Age of Onset  . Hypertension Mother   . Cancer Mother        laryngeal  . Heart disease Mother        stent  . Hypertension Father   . Lung cancer Father 77       hx smoking  . Heart disease Other   . Hypertension Other   . Stroke Other        Grandparent  . Kidney disease Other        Grandparent  . Diabetes Other        FH of Diabetes  . Hypertension Sister   . Cancer Maternal Uncle        Lung CA  . Hypertension Brother   . Hypertension Sister   . Breast cancer Maternal Aunt 65  . Breast cancer Paternal Aunt 67  . Prostate cancer Paternal Uncle   . Breast cancer Maternal Aunt        dx 60+  . Breast cancer Paternal Aunt        dx 93's   . Breast cancer Paternal Aunt        dx 32's  . Prostate cancer Paternal Uncle   . Lung cancer Paternal Uncle   . Breast cancer Cousin 33  . Breast cancer Cousin        dx <50  . Breast cancer Cousin        dx <50  . Breast cancer Cousin        dx <50  . Colon cancer Neg Hx   . Esophageal cancer Neg Hx   . Rectal cancer Neg Hx   . Stomach cancer Neg Hx     Review of Systems: Review of Systems  Constitutional: Negative for chills and fever.  HENT: Negative for congestion and sore throat.   Respiratory: Negative for cough and shortness of breath.   Cardiovascular: Negative for chest pain and palpitations.  Gastrointestinal: Negative for abdominal pain, nausea and vomiting.  Musculoskeletal: Positive for back pain.  Skin: Negative for itching and rash.    Physical Exam: Vital Signs BP (!) 146/86 (BP Location: Left Arm, Patient Position: Sitting, Cuff Size: Large)   Pulse 78   Ht $R'5\' 5"'xs$  (1.651 m)   Wt 238 lb 6.4 oz (108.1 kg)   LMP 03/31/2014   SpO2 98%   BMI 39.67 kg/m  Physical Exam Vitals and nursing note reviewed.  Constitutional:      Appearance: Normal appearance. She is normal weight.  HENT:     Head: Normocephalic and atraumatic.  Eyes:  Extraocular Movements: Extraocular movements intact.  Cardiovascular:     Rate and Rhythm: Normal rate and regular rhythm.     Pulses: Normal pulses.     Heart sounds: Normal heart sounds.  Pulmonary:     Effort: Pulmonary effort is normal.     Breath sounds: Normal breath sounds. No wheezing, rhonchi or rales.  Abdominal:     General: Bowel sounds are normal.     Palpations: Abdomen is soft.  Musculoskeletal:        General: No swelling. Normal range of motion.     Cervical back: Normal range of motion.  Skin:    General: Skin is warm and dry.     Coloration: Skin is not pale.     Findings: No erythema or rash.  Neurological:     General: No focal deficit present.     Mental Status: She is alert and oriented  to person, place, and time.  Psychiatric:        Mood and Affect: Mood normal.        Behavior: Behavior normal.        Thought Content: Thought content normal.        Judgment: Judgment normal.     Assessment/Plan:  Ms. Allender scheduled for panniculectomy with Dr. Marla Roe.  Risks, benefits, and alternatives of procedure discussed, questions answered and consent obtained.    Smoking Status: Former Smoker - quit ~ 2.5 yrs ago; Counseling Given? N/A  Caprini Score: High; Risk Factors include: 55 year old female, Hx thyroid cancer, sarcoidosis, family (mother) hx blood clots, BMI > 25, and length of planned surgery. Recommendation for mechanical and pharmacological prophylaxis during surgery. Encourage early ambulation.   Pictures obtained: 05/12/2020  Post-op Rx sent to pharmacy: Norco, Keflex, Tylenol, Zofran  Patient was provided with the panniculectomy and General Surgical Risk consent document and Pain Medication Agreement prior to their appointment.  They had adequate time to read through the risk consent documents and Pain Medication Agreement. We also discussed them in person together during this preop appointment. All of their questions were answered to their satisfaction.  Recommended calling if they have any further questions.  Risk consent form and Pain Medication Agreement to be scanned into patient's chart.  The risk that can be encountered for this procedure were discussed and include the following but not limited to these: asymmetry, fluid accumulation, firmness of the tissue, skin loss, decrease or no sensation, fat necrosis, bleeding, infection, healing delay.  Deep vein thrombosis, cardiac and pulmonary complications are risks to any procedure.  There are risks of anesthesia, changes to skin sensation and injury to nerves or blood vessels.  The muscle can be temporarily or permanently injured.  You may have an allergic reaction to tape, suture, glue, blood products which can  result in skin discoloration, swelling, pain, skin lesions, poor healing.  Any of these can lead to the need for revisonal surgery or stage procedures.  Weight gain and weigh loss can also effect the long term appearance. The results are not guaranteed to last a lifetime.  Future surgery may be required.    Electronically signed by: Threasa Heads, PA-C 12/01/2020 11:57 AM

## 2020-12-01 ENCOUNTER — Ambulatory Visit (INDEPENDENT_AMBULATORY_CARE_PROVIDER_SITE_OTHER): Payer: 59 | Admitting: Plastic Surgery

## 2020-12-01 ENCOUNTER — Encounter: Payer: Self-pay | Admitting: Plastic Surgery

## 2020-12-01 ENCOUNTER — Other Ambulatory Visit: Payer: Self-pay

## 2020-12-01 VITALS — BP 146/86 | HR 78 | Ht 65.0 in | Wt 238.4 lb

## 2020-12-01 DIAGNOSIS — M793 Panniculitis, unspecified: Secondary | ICD-10-CM

## 2020-12-01 MED ORDER — CEPHALEXIN 500 MG PO CAPS: 500.0000 mg | ORAL_CAPSULE | Freq: Four times a day (QID) | ORAL | 0 refills | Status: AC

## 2020-12-01 MED ORDER — ACETAMINOPHEN 500 MG PO TABS: 500.0000 mg | ORAL_TABLET | Freq: Four times a day (QID) | ORAL | 0 refills | Status: DC | PRN

## 2020-12-01 MED ORDER — HYDROCODONE-ACETAMINOPHEN 5-325 MG PO TABS: 1.0000 | ORAL_TABLET | Freq: Three times a day (TID) | ORAL | 0 refills | Status: AC | PRN

## 2020-12-01 MED ORDER — ONDANSETRON 4 MG PO TBDP: 4.0000 mg | ORAL_TABLET | Freq: Three times a day (TID) | ORAL | 0 refills | Status: DC | PRN

## 2020-12-08 ENCOUNTER — Telehealth: Payer: Self-pay | Admitting: *Deleted

## 2020-12-08 NOTE — Telephone Encounter (Signed)
Patient called stating that she has had a problem losing her voice off and on for about 6 months. Patient stated that it seems to happen when she talks a lot. Patient stated that she has a family history of throat cancer. Offered patient an appointment Thursday which she declined stating that she will be out of town. Appointment scheduled with Allie Bossier NP 12/15/20. Patient stated that she does have a cough but that has been going on since she had pneumonia back in January. Patient denies a fever or any other covid symptoms.  Patient stated that she is sure that she had covid back in December, but did not test positive.

## 2020-12-08 NOTE — Telephone Encounter (Signed)
Noted, will evaluate. 

## 2020-12-10 ENCOUNTER — Ambulatory Visit: Payer: 59

## 2020-12-10 ENCOUNTER — Other Ambulatory Visit: Payer: Self-pay | Admitting: *Deleted

## 2020-12-11 ENCOUNTER — Other Ambulatory Visit: Payer: Self-pay | Admitting: Endocrinology

## 2020-12-14 NOTE — Progress Notes (Signed)
Patient ID: [name]                 DOB: July 05, 1966                      MRN: [id number]     HPI: [name] is a 55 y.o. female referred by Dr. Oval Linsey to HTN clinic. PMH is significant for HTN, HLD, T2DM, graves' disease, asthma, sarcoidosis, and tobacco abuse.  Pt last seen at [facility] HTN clinic on 11/10/20 at which BP was elevated at 140/94. Irbesartan/HCTZ was discontinued due to recurrent gout flares and pt started on candesartan 32 mg daily. Pt continued on carvedilol 68m twice daily and spironolactone 223mdaily, and provided with BP monitor. Additionally, at plastic surgery pre-op visit on 12/01/20, BP was elevated at 146/86.   Patient presents today in good spirits. Reports medication adherence with HTN medications and no missed doses, however did not take morning meds prior to visit today due to running errands. Reports checking BP at home, however forgot to bring BP log - recalled BP 184/86 from last week. Additionally, BP this morning at PCP visit was 120/80. Denies dizziness, headaches, blurred vision and LE swelling.   Current HTN meds:  Candesartan 32 mg daily (9am) Carvedilol 2573mwice daily (9am and 9pm) Spironolactone 61m53mily - (9am)  Previously tried: Lisinopril - cough; Irbesartan/HCTZ 150-12.5mg 41mly (gout flares)  BP goal: <130/80 mmHg  Family History: Breast cancer (age of onset: 48) i81her cousin; Breast cancer (age of onset: 52) i44her paternal aunt; Breast cancer (age of onset: 65) i90her maternal aunt; Cancer in her maternal uncle and mother; Diabetes in an other family member; Heart disease in her mother and another family member; Hypertension in her brother, father, mother, sister, sister, and another family member; Kidney disease in an other family member; Lung cancer in her paternal uncle; Lung cancer (age of onset: 68) i36her father; Prostate cancer in her paternal uncle and paternal uncle; Stroke in an other family member  Social  History: former smoker (quit 2018 - 7.5 pack years); social drinker  Diet: decreased appetite after chemotherapy, kidney function deceased, drinks lots of water daily (oatmeal, eggs, turke[place]age, sandwich)  Exercise: PREP program (no longer interested)  Home BP readings: 184/86  Wt Readings from Last 3 Encounters:  12/15/20 242 lb (109.8 kg)  12/01/20 238 lb 6.4 oz (108.1 kg)  11/10/20 237 lb 12.8 oz (107.9 kg)   BP Readings from Last 3 Encounters:  12/15/20 (!) 138/98  12/15/20 120/80  12/01/20 (!) 146/86   Pulse Readings from Last 3 Encounters:  12/15/20 78  12/15/20 72  12/01/20 78    Renal function: CrCl cannot be calculated (Patient's most recent lab result is older than the maximum 21 days allowed.).  Past Medical History:  Diagnosis Date  . Allergy   . Anemia    Normocytic  . Anxiety   . Asthma   . Blood dyscrasia   . Bronchitis 2005  . 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 Diabetes mellitus without complication (HCC) [place] Family history of breast cancer   . Family history of lung cancer   . Family history of prostate cancer   . Gastroenteritis 07/10/07  . GERD 07/24/2006  . GRAVE'S DISEASE 01/01/2008  . History of hidradenitis suppurativa   . History of kidney stones   . History of thrush   . HIV DISEASE 07/24/2006   dx March 05  .  Hyperlipidemia   . HYPERTENSION 07/24/2006  . Hyperthyroidism 08/2006   Grave's Disease -diffuse radiotracer uptake 08/25/06 Thyroid scan-Cold nodule to R lower lobe of thyrorid  . Menometrorrhagia    hx of  . Nephrolithiasis   . 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 Personal history of chemotherapy    2020  . Personal history of radiation therapy    2020  . Pneumonia 2005  . 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 Sarcoidosis 02/08/2007   dx as a teenager in [place] from abnl CXR. Completed 2 yrs Prednisone after lung bx confirmation. No  symptoms since then.  . Suppurative hidradenitis   . Thyroid cancer ([place])   . THYROID NODULE, RIGHT 02/08/2007    Current Outpatient Medications on File Prior to Visit  Medication Sig Dispense Refill  . acetaminophen (TYLENOL) 500 MG tablet Take 1 tablet (500 mg total) by mouth every 6 (six) hours as needed. For use AFTER surgery (Patient taking differently: Take 500 mg by mouth every 6 (six) hours as needed for moderate pain. For use AFTER surgery) 30 tablet 0  . albuterol (VENTOLIN HFA) 108 (90 Base) MCG/ACT inhaler Inhale 2 puffs into the lungs every 6 (six) hours as needed for wheezing or shortness of breath. 8 g 2  . bictegravir-emtricitabine-tenofovir AF (BIKTARVY) 50-200-25 MG TABS tablet Take 1 tablet by mouth daily. 30 tablet 11  . Blood Glucose Monitoring Suppl (FREESTYLE LITE) w/Device KIT Inject 1 each into the skin daily. 1 kit 0  . candesartan (ATACAND) 32 MG tablet Take 1 tablet (32 mg total) by mouth daily. 30 tablet 1  . carvedilol (COREG) 25 MG tablet Take 1 tablet (25 mg total) by mouth 2 (two) times daily. 180 tablet 3  . Cholecalciferol (VITAMIN D) 50 MCG (2000 UT) tablet Take 4,000 Units by mouth daily.    Marland Kitchen doxycycline (VIBRA-TABS) 100 MG tablet Take 100 mg by mouth daily.    . empagliflozin (JARDIANCE) 10 MG TABS tablet Take 1 tablet (10 mg total) by mouth daily before breakfast. 30 tablet 6  . FLOVENT HFA 110 MCG/ACT inhaler TAKE 1 PUFF BY MOUTH TWICE A DAY (Patient taking differently: Inhale 1 puff into the lungs 2 (two) times daily as needed (shortness of breath).) 36 Inhaler 1  . gabapentin (NEURONTIN) 300 MG capsule Take 1 capsule (300 mg total) by mouth 3 (three) times daily. (Patient taking differently: Take 300 mg by mouth daily.) 90 capsule 3  . glipiZIDE (GLUCOTROL) 5 MG tablet TAKE 1 TABLET (5 MG TOTAL) BY MOUTH 2 (TWO) TIMES DAILY BEFORE A MEAL. FOR DIABETES. (Patient taking differently: Take 5 mg by mouth 2 (two) times daily before a meal.) 180 tablet 1  .  glucose blood (ONETOUCH VERIO) test strip USE AS INSTRUCTED TO TEST BLOOD SUGAR DAILY 100 each 2  . Lancets (ONETOUCH ULTRASOFT) lancets Use as instructed to test blood sugar daily 100 each 5  . levothyroxine (SYNTHROID) 112 MCG tablet TAKE 1 TABLET BY MOUTH DAILY BEFORE BREAKFAST. (Patient taking differently: Take 112 mcg by mouth daily before breakfast.) 30 tablet 5  . ondansetron (ZOFRAN-ODT) 4 MG disintegrating tablet Take 1 tablet (4 mg total) by mouth every 8 (eight) hours as needed for nausea or vomiting. 20 tablet 0  . potassium chloride SA (KLOR-CON) 20 MEQ tablet Take 1 tablet (20 mEq total) by mouth 2 (two) times daily. For low potassium. 10 tablet 0  . rosuvastatin (CRESTOR) 10 MG tablet TAKE 1 TABLET  BY MOUTH EVERY DAY FOR CHOLESTEROL (Patient taking differently: Take 10 mg by mouth daily.) 90 tablet 3  . sertraline (ZOLOFT) 50 MG tablet TAKE 1 TABLET (50 MG TOTAL) BY MOUTH DAILY. FOR ANXIETY AND DEPRESSION. 90 tablet 1  . spironolactone (ALDACTONE) 25 MG tablet Take 1 tablet (25 mg total) by mouth daily. 90 tablet 3  . tamoxifen (NOLVADEX) 20 MG tablet TAKE 1 TABLET BY MOUTH EVERY DAY (Patient taking differently: Take 20 mg by mouth daily.) 90 tablet 4  . TRULICITY 8.67 RJ/7.3GK SOPN INJECT CONTENTS OF 1 PEN (0.75MG) INTO THE SKIN ONCE WEEKLY FOR DIABETES (Patient taking differently: Inject 0.75 mg as directed every Sunday.) 2 mL 3  . [DISCONTINUED] prochlorperazine (COMPAZINE) 10 MG tablet TAKE 1 TABLET BY MOUTH EVERY 6 HOURS AS NEEDED FOR NAUSEA OR VOMITING 30 tablet 2   Current Facility-Administered Medications on File Prior to Visit  Medication Dose Route Frequency Provider Last Rate Last Admin  . sodium chloride flush (NS) 0.9 % injection 10 mL  10 mL Intracatheter PRN Magrinat, Virgie Dad, MD   10 mL at 09/19/18 1551    Allergies  Allergen Reactions  . Genvoya [Elviteg-Cobic-Emtricit-Tenofaf] Hives  . Lisinopril Cough     Assessment/Plan:  1. Hypertension - BP above goal  <130/80 mmHg. Medication adherence appears fair and pt did not take morning HTN medications today. BP this AM at PCP was 120/80. Continued candesartan 32 mg daily, carvedilol 69m twice daily and spironolactone 286mdaily. Discussed importance of medication adherence and encouraged patient to check BP daily at least 1 hour after medications and to bring log to next visit. Patient verbalized understanding. [facility] - scheduled appointment in 4 weeks.   IvLorel MonacoPharmD, [id number]. Ch[address] LaneGrUniondale[place] [id number]: [phone]: [phone]

## 2020-12-15 ENCOUNTER — Ambulatory Visit (INDEPENDENT_AMBULATORY_CARE_PROVIDER_SITE_OTHER): Payer: 59 | Admitting: Pharmacist

## 2020-12-15 ENCOUNTER — Ambulatory Visit (INDEPENDENT_AMBULATORY_CARE_PROVIDER_SITE_OTHER): Payer: 59 | Admitting: Primary Care

## 2020-12-15 ENCOUNTER — Other Ambulatory Visit: Payer: Self-pay

## 2020-12-15 ENCOUNTER — Encounter: Payer: Self-pay | Admitting: Primary Care

## 2020-12-15 ENCOUNTER — Telehealth: Payer: Self-pay | Admitting: Pharmacist

## 2020-12-15 ENCOUNTER — Encounter: Payer: Self-pay | Admitting: Pharmacist

## 2020-12-15 VITALS — BP 138/98 | HR 78

## 2020-12-15 VITALS — BP 120/80 | HR 72 | Temp 97.7°F | Ht 65.0 in | Wt 242.0 lb

## 2020-12-15 DIAGNOSIS — I10 Essential (primary) hypertension: Secondary | ICD-10-CM | POA: Diagnosis not present

## 2020-12-15 DIAGNOSIS — K219 Gastro-esophageal reflux disease without esophagitis: Secondary | ICD-10-CM

## 2020-12-15 DIAGNOSIS — M1A09X0 Idiopathic chronic gout, multiple sites, without tophus (tophi): Secondary | ICD-10-CM | POA: Diagnosis not present

## 2020-12-15 DIAGNOSIS — R490 Dysphonia: Secondary | ICD-10-CM | POA: Diagnosis not present

## 2020-12-15 MED ORDER — FAMOTIDINE 20 MG PO TABS: ORAL_TABLET | ORAL | 0 refills | Status: DC

## 2020-12-15 MED ORDER — CETIRIZINE HCL 10 MG PO TABS: ORAL_TABLET | ORAL | 0 refills | Status: DC

## 2020-12-15 MED ORDER — ALLOPURINOL 100 MG PO TABS: 100.0000 mg | ORAL_TABLET | Freq: Every day | ORAL | 0 refills | Status: DC

## 2020-12-15 NOTE — Assessment & Plan Note (Signed)
Recent flare a few days ago, but overall reduction in flare frequency since initiation of allopurinol.  Agree to continue allopurinol at 100 mg, will trial a dose increase to 100 mg daily and closely watch renal function.  Repeat uric acid and BMP in 1 weeks.

## 2020-12-15 NOTE — Progress Notes (Signed)
Subjective:    Patient ID: [name], female    DOB: 1966/01/23, 55 y.o.   MRN: [id number]  HPI  This visit occurred during the SARS-CoV-2 public health emergency.  Safety protocols were in place, including screening questions prior to the visit, additional usage of staff PPE, and extensive cleaning of exam room while observing appropriate contact time as indicated for disinfecting solutions.   [name] is a 55 year old female with a significant medical history including hypertension, hypothyroidism, CKD, diabetes, chronic gout, HIV, breast cancer, thyroid cancer, sarcoidosis who presents today with a chief complaint of hoarse voice and gout follow up.  She's noticed a hoarse voice intermittently for 6+ months. Several days ago she lost her voice, voice has just returned. She's been told by her family that she coughs at night.  She denies esophageal burning, epigastric pain.  She does notice chronic postnasal drip with congestion.  She is not taking either GERD medication or antihistamines.  Following with endocrinology for hypothyroidism.  Currently prescribed allopurinol 50 mg every other day, but she has been taking 100 mg every other day as the tablets disintegrate when she cuts them.  Recent gout flare several days ago, improving after taking a few doses of prednisone she had at home.  She did stop her allopurinol when the flare began, resumed 2 days ago.  She does believe that the allopurinol is helping to reduce gout flare frequencies.  Evaluated by her kidney doctor a few weeks ago, no mention was made regarding allopurinol and renal function.  Review of Systems  HENT: Positive for congestion, postnasal drip and voice change.   Musculoskeletal: Positive for arthralgias.       See HPI       Past Medical History:  Diagnosis Date  . Allergy   . Anemia    Normocytic  . Anxiety   . Asthma   . Blood dyscrasia   . Bronchitis 2005  . CLASS 1-EXOPHTHALMOS-THYROTOXIC  02/08/2007  . Diabetes mellitus without complication ([place])   . Family history of breast cancer   . Family history of lung cancer   . Family history of prostate cancer   . Gastroenteritis 07/10/07  . GERD 07/24/2006  . GRAVE'S DISEASE 01/01/2008  . History of hidradenitis suppurativa   . History of kidney stones   . History of thrush   . HIV DISEASE 07/24/2006   dx March 05  . Hyperlipidemia   . HYPERTENSION 07/24/2006  . Hyperthyroidism 08/2006   Grave's Disease -diffuse radiotracer uptake 08/25/06 Thyroid scan-Cold nodule to R lower lobe of thyrorid  . Menometrorrhagia    hx of  . Nephrolithiasis   . 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 Personal history of chemotherapy    2020  . Personal history of radiation therapy    2020  . Pneumonia 2005  . 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 Sarcoidosis 02/08/2007   [place] as a teenager in [place] from abnl CXR. Completed 2 yrs Prednisone after lung bx confirmation. No symptoms since then.  . Suppurative hidradenitis   . Thyroid cancer ([place])   . THYROID NODULE, RIGHT 02/08/2007     Social History   Socioeconomic History  . Marital status: Single    Spouse name: Not on file  . Number of children: 1  . Years of education: Not on file  . Highest education level: Not on file  Occupational History  . Occupation: Secondary school teacher  Tobacco Use  .  Smoking status: Former Smoker    Packs/day: 0.50    Years: 15.00    Pack years: 7.50    Types: Cigarettes    Start date: 04/12/2017  . Smokeless tobacco: Never Used  . Tobacco comment: quit smoking  may 2018  Vaping Use  . Vaping Use: Never used  Substance and Sexual Activity  . Alcohol use: Yes    Alcohol/week: 0.0 standard drinks    Comment: social  . Drug use: No  . Sexual activity: Not Currently    Birth control/protection: Post-menopausal    Comment: declined condoms  Other Topics Concern  . Not on file  Social  History Narrative  . Not on file   Social Determinants of Health   Financial Resource Strain: Not on file  Food Insecurity: Not on file  Transportation Needs: Not on file  Physical Activity: Not on file  Stress: Not on file  Social Connections: Not on file  Intimate Partner Violence: Not on file    Past Surgical History:  Procedure Laterality Date  . BREAST EXCISIONAL BIOPSY Right 04/26/2018   right axilla negative  . BREAST EXCISIONAL BIOPSY Left 04/26/2018   left axilla negative  . BREAST LUMPECTOMY Right 10/03/2018   malignant  . BREAST LUMPECTOMY WITH RADIOACTIVE SEED AND SENTINEL LYMPH NODE BIOPSY Right 10/03/2018   Procedure: RIGHT BREAST LUMPECTOMY WITH RADIOACTIVE SEED AND SENTINEL LYMPH NODE MAPPING;  Surgeon: Erroll Luna, MD;  Location: [facility];  Service: General;  Laterality: Right;  . BREAST SURGERY  1997   Breast Reduction   . CYSTOSCOPY W/ URETERAL STENT REMOVAL  11/09/2012   Procedure: CYSTOSCOPY WITH STENT REMOVAL;  Surgeon: Alexis Frock, MD;  Location: [facility];  Service: Urology;  Laterality: Right;  . CYSTOSCOPY WITH RETROGRADE PYELOGRAM, URETEROSCOPY AND STENT PLACEMENT  11/09/2012   Procedure: CYSTOSCOPY WITH RETROGRADE PYELOGRAM, URETEROSCOPY AND STENT PLACEMENT;  Surgeon: Alexis Frock, MD;  Location: [facility];  Service: Urology;  Laterality: Left;  LEFT URETEROSCOPY, STONE MANIPULATION, left STENT exchange   . CYSTOSCOPY WITH STENT PLACEMENT  10/02/2012   Procedure: CYSTOSCOPY WITH STENT PLACEMENT;  Surgeon: Alexis Frock, MD;  Location: [facility];  Service: Urology;  Laterality: Left;  . DILATION AND CURETTAGE OF UTERUS  Feb 2004   s/p for 1st trimester nonviable pregnancy  . EYE SURGERY     sty under eyelid  . INCISE AND DRAIN ABCESS  Nov 03   s/p I &D for righ inframmary fold hidradenitis  . INCISION AND DRAINAGE PERITONSILLAR ABSCESS  Mar 03  . IR CV LINE INJECTION  06/07/2018  . IR IMAGING GUIDED PORT INSERTION  06/20/2018  . IR REMOVAL TUN ACCESS W/  PORT W/O FL MOD SED  06/20/2018  . IRRIGATION AND DEBRIDEMENT ABSCESS  01/31/2012   Procedure: IRRIGATION AND DEBRIDEMENT ABSCESS;  Surgeon: Shann Medal, MD;  Location: [facility];  Service: General;  Laterality: Right;  right breast and axilla   . NEPHROLITHOTOMY  10/02/2012   Procedure: NEPHROLITHOTOMY PERCUTANEOUS;  Surgeon: Alexis Frock, MD;  Location: [facility];  Service: Urology;  Laterality: Right;  First Stage Percutaneous Nephrolithotomy with Surgeon Access, Left Ureteral Stent    . NEPHROLITHOTOMY  10/04/2012   Procedure: NEPHROLITHOTOMY PERCUTANEOUS SECOND LOOK;  Surgeon: Alexis Frock, MD;  Location: [facility];  Service: Urology;  Laterality: Right;     . NEPHROLITHOTOMY  10/08/2012   Procedure: NEPHROLITHOTOMY PERCUTANEOUS;  Surgeon: Alexis Frock, MD;  Location[facility];  Service: Urology;  Laterality: Right;  THIRD STAGE, nephrostomy tube exchange  x 2  . NEPHROLITHOTOMY  10/11/2012   Procedure: NEPHROLITHOTOMY PERCUTANEOUS SECOND LOOK;  Surgeon: Alexis Frock, MD;  Location[facility];  Service: Urology;  Laterality: Right;  RIGHT 4 STAGE PERCUTANOUS NEPHROLITHOTOMY, right URETEROSCOPY WITH HOLMIUM LASER   . PORT-A-CATH REMOVAL N/A 07/16/2020   Procedure: REMOVAL PORT-A-CATH;  Surgeon: Erroll Luna, MD;  Location: [place];  Service: General;  Laterality: N/A;  . PORTACATH PLACEMENT Left 05/17/2018   Procedure: INSERTION PORT-A-CATH;  Surgeon: Coralie Keens, MD;  Location: [facility];  Service: General;  Laterality: Left;  . RADICAL NECK DISSECTION  04/12/2017   limited/notes 04/12/2017  . RADICAL NECK DISSECTION N/A 04/12/2017   Procedure: RADICAL NECK DISSECTION;  Surgeon: Melida Quitter, MD;  Location: [facility] [facility];  Service: ENT;  Laterality: N/A;  limited neck dissection 2 hours total  . REDUCTION MAMMAPLASTY Bilateral 1998  . RIGHT/LEFT HEART CATH AND CORONARY ANGIOGRAPHY N/A 03/12/2020   Procedure: RIGHT/LEFT HEART CATH AND CORONARY ANGIOGRAPHY;  Surgeon:  Jolaine Artist, MD;  Location: [facility];  Service: Cardiovascular;  Laterality: N/A;  . [place]  1994  . THYROIDECTOMY  04/12/2017   completion/notes 04/12/2017  . THYROIDECTOMY N/A 04/12/2017   Procedure: THYROIDECTOMY;  Surgeon: Melida Quitter, MD;  Location: [facility];  Service: ENT;  Laterality: N/A;  Completion Thyroidectomy  . TOTAL THYROIDECTOMY  2010    Family History  Problem Relation Age of Onset  . Hypertension Mother   . Cancer Mother        laryngeal  . Heart disease Mother        stent  . Hypertension Father   . Lung cancer Father 101       hx smoking  . Heart disease Other   . Hypertension Other   . Stroke Other        Grandparent  . Kidney disease Other        Grandparent  . Diabetes Other        FH of Diabetes  . Hypertension Sister   . Cancer Maternal Uncle        Lung CA  . Hypertension Brother   . Hypertension Sister   . Breast cancer Maternal Aunt 65  . Breast cancer Paternal Aunt 80  . Prostate cancer Paternal Uncle   . Breast cancer Maternal Aunt        dx 60+  . Breast cancer Paternal Aunt        dx 74's  . Breast cancer Paternal Aunt        dx 27's  . Prostate cancer Paternal Uncle   . Lung cancer Paternal Uncle   . Breast cancer Cousin 78  . Breast cancer Cousin        dx <50  . Breast cancer Cousin        dx <50  . Breast cancer Cousin        dx <50  . Colon cancer Neg Hx   . Esophageal cancer Neg Hx   . Rectal cancer Neg Hx   . Stomach cancer Neg Hx     Allergies  Allergen Reactions  . Genvoya [Elviteg-Cobic-Emtricit-Tenofaf] Hives  . Lisinopril Cough    Current Outpatient Medications on File Prior to Visit  Medication Sig Dispense Refill  . acetaminophen (TYLENOL) 500 MG tablet Take 1 tablet (500 mg total) by mouth every 6 (six) hours as needed. For use AFTER surgery (Patient taking differently: Take 500 mg by mouth every 6 (six) hours as needed  for moderate pain. For use AFTER surgery) 30 tablet 0  . albuterol  (VENTOLIN HFA) 108 (90 Base) MCG/ACT inhaler Inhale 2 puffs into the lungs every 6 (six) hours as needed for wheezing or shortness of breath. 8 g 2  . bictegravir-emtricitabine-tenofovir AF (BIKTARVY) 50-200-25 MG TABS tablet Take 1 tablet by mouth daily. 30 tablet 11  . Blood Glucose Monitoring Suppl (FREESTYLE LITE) w/Device KIT Inject 1 each into the skin daily. 1 kit 0  . candesartan (ATACAND) 32 MG tablet Take 1 tablet (32 mg total) by mouth daily. 30 tablet 1  . Cholecalciferol (VITAMIN D) 50 MCG (2000 UT) tablet Take 4,000 Units by mouth daily.    Marland Kitchen doxycycline (VIBRA-TABS) 100 MG tablet Take 100 mg by mouth daily.    . empagliflozin (JARDIANCE) 10 MG TABS tablet Take 1 tablet (10 mg total) by mouth daily before breakfast. 30 tablet 6  . FLOVENT HFA 110 MCG/ACT inhaler TAKE 1 PUFF BY MOUTH TWICE A DAY (Patient taking differently: Inhale 1 puff into the lungs 2 (two) times daily as needed (shortness of breath).) 36 Inhaler 1  . gabapentin (NEURONTIN) 300 MG capsule Take 1 capsule (300 mg total) by mouth 3 (three) times daily. (Patient taking differently: Take 300 mg by mouth daily.) 90 capsule 3  . glipiZIDE (GLUCOTROL) 5 MG tablet TAKE 1 TABLET (5 MG TOTAL) BY MOUTH 2 (TWO) TIMES DAILY BEFORE A MEAL. FOR DIABETES. (Patient taking differently: Take 5 mg by mouth 2 (two) times daily before a meal.) 180 tablet 1  . glucose blood (ONETOUCH VERIO) test strip USE AS INSTRUCTED TO TEST BLOOD SUGAR DAILY 100 each 2  . Lancets (ONETOUCH ULTRASOFT) lancets Use as instructed to test blood sugar daily 100 each 5  . levothyroxine (SYNTHROID) 112 MCG tablet TAKE 1 TABLET BY MOUTH DAILY BEFORE BREAKFAST. (Patient taking differently: Take 112 mcg by mouth daily before breakfast.) 30 tablet 5  . ondansetron (ZOFRAN-ODT) 4 MG disintegrating tablet Take 1 tablet (4 mg total) by mouth every 8 (eight) hours as needed for nausea or vomiting. 20 tablet 0  . potassium chloride SA (KLOR-CON) 20 MEQ tablet Take 1 tablet  (20 mEq total) by mouth 2 (two) times daily. For low potassium. 10 tablet 0  . rosuvastatin (CRESTOR) 10 MG tablet TAKE 1 TABLET BY MOUTH EVERY DAY FOR CHOLESTEROL (Patient taking differently: Take 10 mg by mouth daily.) 90 tablet 3  . sertraline (ZOLOFT) 50 MG tablet TAKE 1 TABLET (50 MG TOTAL) BY MOUTH DAILY. FOR ANXIETY AND DEPRESSION. 90 tablet 1  . spironolactone (ALDACTONE) 25 MG tablet Take 1 tablet (25 mg total) by mouth daily. 90 tablet 3  . tamoxifen (NOLVADEX) 20 MG tablet TAKE 1 TABLET BY MOUTH EVERY DAY (Patient taking differently: Take 20 mg by mouth daily.) 90 tablet 4  . TRULICITY 9.47 ML/4.6TK SOPN INJECT CONTENTS OF 1 PEN (0.$Remove'75MG'nriAbNf$ ) INTO THE SKIN ONCE WEEKLY FOR DIABETES (Patient taking differently: Inject 0.75 mg as directed every Sunday.) 2 mL 3  . carvedilol (COREG) 25 MG tablet Take 1 tablet (25 mg total) by mouth 2 (two) times daily. 180 tablet 3  . [DISCONTINUED] prochlorperazine (COMPAZINE) 10 MG tablet TAKE 1 TABLET BY MOUTH EVERY 6 HOURS AS NEEDED FOR NAUSEA OR VOMITING 30 tablet 2   Current Facility-Administered Medications on File Prior to Visit  Medication Dose Route Frequency Provider Last Rate Last Admin  . sodium chloride flush (NS) 0.9 % injection 10 mL  10 mL Intracatheter PRN Magrinat, Virgie Dad, MD  10 mL at 09/19/18 1551    BP 120/80   Pulse 72   Temp 97.7 F (36.5 C) (Temporal)   Ht $R'5\' 5"'lL$  (1.651 m)   Wt 242 lb (109.8 kg)   LMP 03/31/2014   SpO2 99%   BMI 40.27 kg/m    Objective:   Physical Exam Constitutional:      Appearance: She is well-nourished.  HENT:     Mouth/Throat:     Comments: Voice hoarseness today, appears baseline for patient. Cardiovascular:     Rate and Rhythm: Normal rate and regular rhythm.  Pulmonary:     Effort: Pulmonary effort is normal.     Breath sounds: Normal breath sounds.  Musculoskeletal:     Cervical back: Neck supple.  Skin:    General: Skin is warm and dry.  Neurological:     Mental Status: She is oriented  to person, place, and time.  Psychiatric:        Mood and Affect: Mood and affect and mood normal.            Assessment & Plan:

## 2020-12-15 NOTE — Patient Instructions (Signed)
We changed her allopurinol (gout prevention medication) to 100 mg every day.  I sent a new prescription to your pharmacy.  For the voice hoarseness:  Start either cetirizine (allergy medicine) or famotidine (heartburn medicine).  The cetirizine is taken once daily at bedtime.  The famotidine can be taken 1 or 2 tablets at bedtime.  Please schedule a lab appointment for 4 weeks to repeat your gout levels and kidney function.  It was a pleasure to see you today!

## 2020-12-15 NOTE — Assessment & Plan Note (Signed)
Chronic, patient appears to baseline today.  Discussed differentials which include GERD, allergic rhinitis, other etiology.  Start with famotidine 20 to 40 mg at bedtime. Add in Zyrtec 10 mg at bedtime if needed.  She will update. Consider ENT evaluation if no improvement, especially given her history of thyroid cancer, tobacco abuse, breast cancer.

## 2020-12-15 NOTE — Patient Instructions (Addendum)
It was nice to see you today!  Your goal blood pressure is <130/80 mmHg. In clinic, your blood pressure was 138/98 mmHg.  Medication Changes:  Continue taking candesartan 32 mg daily in the mornings  Continue taking spironolactone 25 mg daily in the mornings  Continue carvedilol 25 mg twice daily   Monitor blood pressure at home daily and keep a log (on your phone or piece of paper) to bring with you to your next visit. Write down date, time, blood pressure and pulse.  Keep up the good work with diet and exercise. Aim for a diet full of vegetables, fruit and lean meats (chicken, [place], fish). Try to limit salt intake by eating fresh or frozen vegetables (instead of canned), rinse canned vegetables prior to cooking and do not add any additional salt to meals.   Please give [place] a call at [phone] with any questions or concerns.

## 2020-12-15 NOTE — Telephone Encounter (Signed)
Left HIPAA compliant voicemail requesting call back regarding HTN appointment. Will like to reschedule patient in 2weeks instead of 4 weeks with [facility] HTN clinic.

## 2020-12-15 NOTE — Assessment & Plan Note (Signed)
Suspect reflux to be contributing to voice hoarseness, especially given that she coughs at night.  Prescription for famotidine 20 mg sent to pharmacy to take 20 mg or 40 mg at bedtime.  We will also trial Zyrtec at bedtime if needed.  She will update.

## 2020-12-17 ENCOUNTER — Other Ambulatory Visit: Payer: Self-pay | Admitting: Primary Care

## 2020-12-17 ENCOUNTER — Other Ambulatory Visit (HOSPITAL_COMMUNITY)
Admission: RE | Admit: 2020-12-17 | Discharge: 2020-12-17 | Disposition: A | Discharge status: Home / Self Care | Payer: 59 | Source: Ambulatory Visit | Attending: Plastic Surgery | Admitting: Plastic Surgery

## 2020-12-17 DIAGNOSIS — R490 Dysphonia: Secondary | ICD-10-CM

## 2020-12-17 DIAGNOSIS — Z20822 Contact with and (suspected) exposure to covid-19: Secondary | ICD-10-CM | POA: Insufficient documentation

## 2020-12-17 DIAGNOSIS — Z01812 Encounter for preprocedural laboratory examination: Secondary | ICD-10-CM | POA: Diagnosis present

## 2020-12-17 LAB — SARS CORONAVIRUS 2 (TAT 6-24 HRS): SARS Coronavirus 2: NEGATIVE

## 2020-12-18 ENCOUNTER — Inpatient Hospital Stay (HOSPITAL_COMMUNITY): Admission: RE | Admit: 2020-12-18 | Payer: 59 | Source: Ambulatory Visit

## 2020-12-18 ENCOUNTER — Telehealth: Payer: Self-pay | Admitting: Surgical

## 2020-12-18 ENCOUNTER — Encounter (HOSPITAL_COMMUNITY): Payer: Self-pay | Admitting: Plastic Surgery

## 2020-12-18 ENCOUNTER — Other Ambulatory Visit: Payer: Self-pay

## 2020-12-18 NOTE — Telephone Encounter (Signed)
Patient called to advise that she is having surgery on Monday and needs to know what the name of the compression garment is that she needs for after surgery. She is having a panniculectomy. Please call patient to advise.

## 2020-12-18 NOTE — Progress Notes (Signed)
Anesthesia Chart Review: [name]   Case: [id number] Date/Time: 12/21/20 0715   Procedure: PANNICULECTOMY (N/A Abdomen) - 4 hours, please   Anesthesia type: General   Pre-op diagnosis: panniculitis   Location: [facility] OR ROOM 06 / [facility]   Surgeons: Wallace Going, DO      DISCUSSION: Patient is a 55 year old female scheduled for the above procedure.   History includes former smoker, HLD, HTN (resistant), DM2, HIV (diagnosed 12/2003), thyroid disorder (s/p total thyroidectomy 09/15/08 for Graves' diseease; 01/12/17 left thyroid FNA benign follicular tissue but FNA left cervical LN + for papillary thyroid cancer; s/p completion thyroidectomy, limited left neck dissection 04/12/17 with pathology revealing no malignancy identified in thyroid or LN tissue specimens; post-surgical hypothyroidism), right breast cancer (s/p right breast lumpectomy, sentinel LN biopsy 10/03/18, s/p chemoradiation, PAC 05/17/18-07/16/20), Sarcoidosis, anemia, asthma, GERD, nephrolithiasis (s/p right nephrolithotomy, right nephrostomy tube, bilateral ureteral stents ~ 09/2012), CKD (stage 3), hidradenitis suppurativa, obesity.   Last A1c 7.7 09/28/20, down from 8% which was noted by Phoebe Sharps, PA-C with Plastics.     She sees multiple specialists as outlined below (see PROVIDERS).  PAT RN Jan confirmed patient is taking HIV medications. Recent BP reported as ~ 130/70.   12/17/2020 presurgical COVID-19 test negative.  She is a same-day work-up, so labs, vitals, and anesthesia team evaluation on the day of surgery   VS: LMP 03/31/2014  12/15/20 WT 109.8 kg. BP Readings from Last 3 Encounters:  12/15/20 (!) 138/98  12/15/20 120/80  12/01/20 (!) 146/86   Pulse Readings from Last 3 Encounters:  12/15/20 78  12/15/20 72  12/01/20 78    PROVIDERS: [name], NP is PCP. Last visit 12/15/20. Started on famotidine and as needed Zyrtec at bedtime for chronic hoarseness (> 6 months), possibly due to  laryngopharyngeal reflux. Consider referral back to ENT if no improvement. Continue allopurinol for gout.   Renato Shin, MD is endocrinologist  - Melida Quitter, MD is ENT. Last visit noted 04/20/17. Thyroid US recommended in six month (last done 08/03/20 per Dr. Loanne Drilling).  - Magrinat, Sarajane Jews, MD is HEM-ONC  Kyung Rudd, MD is RAD-ONC  Elmarie Shiley, MD is nephrologist. Creatinine at his office 2.08 on 11/17/20. 2 month follow-up planned.  Skeet Latch, MD is cardiologist for HTN Clinic. Last visit 08/13/20. Had visit with pharmacist Lorel Monaco, [facility] on 12/15/20. Medication adherence fair. Goal < 130/80. 4 week follow-up in HTN Clinic recommended.  Glori Bickers, MD is cardiologist for cardio-oncology. Last visit 07/08/20. Herceptin completed. Echo 02/2020 with EF 55-60%.   Fransico Him, MD is cardiologist for OSA  - Michel Bickers, MD is ID. Per 09/16/20 note, "She has low level viral act sedation but overall her infection remains under very good long-term control.  She will continue Descovy and [name] and follow-up after lab work in 6 months."   LABS: She is for labs on arrival. Most recent comparison labs available in [facility] include: Lab Results  Component Value Date   WBC 7.2 07/14/2020   HGB 12.9 07/14/2020   HCT 40.2 07/14/2020   PLT 204 07/14/2020   GLUCOSE 196 (H) 11/05/2020   ALT 26 07/14/2020   AST 29 07/14/2020   NA 138 11/05/2020   K 3.2 (L) 11/05/2020   CL 101 11/05/2020   CREATININE 2.20 (H) 11/05/2020   BUN 26 (H) 11/05/2020   CO2 27 11/05/2020   TSH 6.46 (H) 07/21/2020   HGBA1C 7.7 (  A) 09/28/2020   Creatinine primarily ~ 1.7-2.10 over the past 15 months.    OTHER: Sleep Study 08/10/20: IMPRESSIONS - Mild obstructive sleep apnea occurred during this study (AHI = 10.4/h) but severe during REM sleep (34.1/hr). - No significant central sleep apnea occurred during this study (CAI = 0.0/h). - Mild oxygen desaturation was noted during this study (Min  O2 = 87.0%). - The patient snored with moderate snoring volume. - EKG findings include PVCs. - Clinically significant periodic limb movements did not occur during sleep. No significant associated arousals. RECOMMENDATIONS - Therapeutic CPAP titration to determine optimal pressure required to alleviate sleep disordered breathing. - Avoid alcohol, sedatives and other CNS depressants that may worsen sleep apnea and disrupt normal sleep architecture. - Sleep hygiene should be reviewed to assess factors that may improve sleep quality. - Weight management and regular exercise should be initiated or continued if appropriate.  PFTs 11/11/16: Interpretation: The FVC, FEV1, FEV1/FVC ratio and FEF25-75% are within normal limits. While the TLC and RV are with normal limits, the FRC is reduced. Following administration of bronchodilators, there is no significant response. The reduced diffusing capacity indicates a minimal loss of functional alveolar capillary surface. However, the diffusing capacity was not corrected for the patient's hemoglobin. Pulmonary Function Diagnosis: Minimal Diffusion Defect disproportionate reduction in ERV c/w body habitus   IMAGES: CXR 10/13/20: IMPRESSION: Central bronchial thickening. Vague ill-defined subsegmental opacities in both lower lung zones, may be atelectasis or pneumonia in the setting of cough.  US Thyroid 08/03/20: IMPRESSION: Expected changes from a total thyroidectomy. No evidence for residual thyroid tissue   EKG:08/13/20: NSR Prolonged QT (QT 462 ms/ QTc per Dr Oval Linsey 485 ms)   CV: RHC/LHC 03/12/20: Findings: Ao = 175/101 (131) LV = 175/11 RA = 6 RV = 40/11 PA = 41/12 (26) PCW = 10 Fick cardiac output/index = 5.1/2.4 PVR = 3.2 WU Ao sat = 96% PA sat = 64%, 65%  Assessment: 1. Normal coronary arteries 2. LVEF 60-65% 3. Mild PAH likely due to OSA/OHS 4. Otherwise normal hemodynamics 4. Severe systemic  HTN  Plan/Discussion: Medical therapy with focus on HTN control and weight loss.    Echo 02/20/20: IMPRESSIONS  1. Left ventricular ejection fraction, by estimation, is 60 to 65%. The  left ventricle has normal function. The left ventricle has no regional  wall motion abnormalities. Left ventricular diastolic parameters are  consistent with Grade I diastolic  dysfunction (impaired relaxation). The average left ventricular global  longitudinal strain is -14.4 %.  2. Right ventricular systolic function is normal. The right ventricular  size is normal.  3. The mitral valve is normal in structure. Mild mitral valve  regurgitation. No evidence of mitral stenosis.  4. The aortic valve is tricuspid. Aortic valve regurgitation is not  visualized. Mild aortic valve sclerosis is present, with no evidence of  aortic valve stenosis.  5. The inferior vena cava is normal in size with greater than 50%  respiratory variability, suggesting right atrial pressure of 3 mmHg.  - Comparison(s): No significant change from prior study. Prior images  reviewed side by side.    Past Medical History:  Diagnosis Date  . Allergy   . Anemia    Normocytic  . Anxiety   . Asthma   . Blood dyscrasia   . Bronchitis 2005  . CKD (chronic kidney disease)   . 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 Diabetes mellitus without complication ([place])   . Family history of breast cancer   . Family  history of lung cancer   . Family history of prostate cancer   . Gastroenteritis 07/10/07  . GERD 07/24/2006  . GRAVE'S DISEASE 01/01/2008  . History of hidradenitis suppurativa   . History of kidney stones   . History of thrush   . HIV DISEASE 07/24/2006   dx March 05  . Hyperlipidemia   . HYPERTENSION 07/24/2006  . Hyperthyroidism 08/2006   Grave's Disease -diffuse radiotracer uptake 08/25/06 Thyroid scan-Cold nodule to R lower lobe of thyrorid  . Menometrorrhagia    hx of  . Nephrolithiasis   . 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  . Personal history of chemotherapy    2020  . Personal history of radiation therapy    2020  . Pneumonia 2005  . 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 Sarcoidosis 02/08/2007   dx as a teenager in [place] from abnl CXR. Completed 2 yrs Prednisone after lung bx confirmation. No symptoms since then.  . Suppurative hidradenitis   . Thyroid cancer ([place])   . THYROID NODULE, RIGHT 02/08/2007    Past Surgical History:  Procedure Laterality Date  . BREAST EXCISIONAL BIOPSY Right 04/26/2018   right axilla negative  . BREAST EXCISIONAL BIOPSY Left 04/26/2018   left axilla negative  . BREAST LUMPECTOMY Right 10/03/2018   malignant  . BREAST LUMPECTOMY WITH RADIOACTIVE SEED AND SENTINEL LYMPH NODE BIOPSY Right 10/03/2018   Procedure: RIGHT BREAST LUMPECTOMY WITH RADIOACTIVE SEED AND SENTINEL LYMPH NODE MAPPING;  Surgeon: Erroll Luna, MD;  Location: [place];  Service: General;  Laterality: Right;  . BREAST SURGERY  1997   Breast Reduction   . CYSTOSCOPY W/ URETERAL STENT REMOVAL  11/09/2012   Procedure: CYSTOSCOPY WITH STENT REMOVAL;  Surgeon: Alexis Frock, MD;  Location: [facility];  Service: Urology;  Laterality: Right;  . CYSTOSCOPY WITH RETROGRADE PYELOGRAM, URETEROSCOPY AND STENT PLACEMENT  11/09/2012   Procedure: CYSTOSCOPY WITH RETROGRADE PYELOGRAM, URETEROSCOPY AND STENT PLACEMENT;  Surgeon: Alexis Frock, MD;  Location: [facility];  Service: Urology;  Laterality: Left;  LEFT URETEROSCOPY, STONE MANIPULATION, left STENT exchange   . CYSTOSCOPY WITH STENT PLACEMENT  10/02/2012   Procedure: CYSTOSCOPY WITH STENT PLACEMENT;  Surgeon: Alexis Frock, MD;  Location: [facility];  Service: Urology;  Laterality: Left;  . DILATION AND CURETTAGE OF UTERUS  Feb 2004   s/p for 1st trimester  nonviable pregnancy  . EYE SURGERY     sty under eyelid  . INCISE AND DRAIN ABCESS  Nov 03   s/p I &D for righ inframmary fold hidradenitis  . INCISION AND DRAINAGE PERITONSILLAR ABSCESS  Mar 03  . IR CV LINE INJECTION  06/07/2018  . IR IMAGING GUIDED PORT INSERTION  06/20/2018  . IR REMOVAL TUN ACCESS W/ PORT W/O FL MOD SED  06/20/2018  . IRRIGATION AND DEBRIDEMENT ABSCESS  01/31/2012   Procedure: IRRIGATION AND DEBRIDEMENT ABSCESS;  Surgeon: Shann Medal, MD;  Location: [facility];  Service: General;  Laterality: Right;  right breast and axilla   . NEPHROLITHOTOMY  10/02/2012   Procedure: NEPHROLITHOTOMY PERCUTANEOUS;  Surgeon: Alexis Frock, MD;  Location: [facility];  Service: Urology;  Laterality: Right;  First Stage Percutaneous Nephrolithotomy with Surgeon Access, Left Ureteral Stent    . NEPHROLITHOTOMY  10/04/2012   Procedure: NEPHROLITHOTOMY PERCUTANEOUS SECOND LOOK;  Surgeon: Alexis Frock, MD;  [facility];  Service: Urology;  Laterality: Right;     . NEPHROLITHOTOMY  10/08/2012   Procedure: NEPHROLITHOTOMY PERCUTANEOUS;  Surgeon: Alexis Frock, MD;  Location: [facility];  Service: Urology;  Laterality: Right;  THIRD STAGE, nephrostomy tube exchange x 2  . NEPHROLITHOTOMY  10/11/2012   Procedure: NEPHROLITHOTOMY PERCUTANEOUS SECOND LOOK;  Surgeon: Alexis Frock, MD;  Location: [facility];  Service: Urology;  Laterality: Right;  RIGHT 4 STAGE PERCUTANOUS NEPHROLITHOTOMY, right URETEROSCOPY WITH HOLMIUM LASER   . PORT-A-CATH REMOVAL N/A 07/16/2020   Procedure: REMOVAL PORT-A-CATH;  Surgeon: Erroll Luna, MD;  Location: [place];  Service: General;  Laterality: N/A;  . PORTACATH PLACEMENT Left 05/17/2018   Procedure: INSERTION PORT-A-CATH;  Surgeon: Coralie Keens, MD;  Location: [place];  Service: General;  Laterality: Left;  . RADICAL NECK DISSECTION  04/12/2017   limited/notes 04/12/2017  . RADICAL NECK DISSECTION N/A 04/12/2017   Procedure: RADICAL  NECK DISSECTION;  Surgeon: Melida Quitter, MD;  Location: [place];  Service: ENT;  Laterality: N/A;  limited neck dissection 2 hours total  . REDUCTION MAMMAPLASTY Bilateral 1998  . RIGHT/LEFT HEART CATH AND CORONARY ANGIOGRAPHY N/A 03/12/2020   Procedure: RIGHT/LEFT HEART CATH AND CORONARY ANGIOGRAPHY;  Surgeon: Jolaine Artist, MD;  Location: [place] [facility];  Service: Cardiovascular;  Laterality: N/A;  . [place]  1994  . THYROIDECTOMY  04/12/2017   completion/notes 04/12/2017  . THYROIDECTOMY N/A 04/12/2017   Procedure: THYROIDECTOMY;  Surgeon: Melida Quitter, MD;  Location: [place];  Service: ENT;  Laterality: N/A;  Completion Thyroidectomy  . TOTAL THYROIDECTOMY  2010    MEDICATIONS: No current facility-administered medications for this encounter.   Marland Kitchen albuterol (VENTOLIN HFA) 108 (90 Base) MCG/ACT inhaler  . candesartan (ATACAND) 32 MG tablet  . carvedilol (COREG) 25 MG tablet  . Cholecalciferol (VITAMIN D) 50 MCG (2000 UT) tablet  . dolutegravir ([name]) 50 MG tablet  . doxycycline (VIBRA-TABS) 100 MG tablet  . empagliflozin (JARDIANCE) 10 MG TABS tablet  . Emtricitabine-Tenofovir AF (DESCOVY PO)  . FLOVENT HFA 110 MCG/ACT inhaler  . gabapentin (NEURONTIN) 300 MG capsule  . glipiZIDE (GLUCOTROL) 5 MG tablet  . levothyroxine (SYNTHROID) 112 MCG tablet  . rosuvastatin (CRESTOR) 10 MG tablet  . spironolactone (ALDACTONE) 25 MG tablet  . tamoxifen (NOLVADEX) 20 MG tablet  . TRULICITY 4.08 XK/4.8JE SOPN  . acetaminophen (TYLENOL) 500 MG tablet  . allopurinol (ZYLOPRIM) 100 MG tablet  . bictegravir-emtricitabine-tenofovir AF (BIKTARVY) 50-200-25 MG TABS tablet  . Blood Glucose Monitoring Suppl (FREESTYLE LITE) w/Device KIT  . cetirizine (ZYRTEC) 10 MG tablet  . famotidine (PEPCID) 20 MG tablet  . glucose blood (ONETOUCH VERIO) test strip  . Lancets (ONETOUCH ULTRASOFT) lancets  . ondansetron (ZOFRAN-ODT) 4 MG disintegrating tablet  . potassium chloride SA (KLOR-CON) 20 MEQ  tablet  . sertraline (ZOLOFT) 50 MG tablet   . sodium chloride flush (NS) 0.9 % injection 10 mL    Myra Gianotti, PA-C Surgical Short Stay/Anesthesiology [facility] Phone [phone] [facility] Phone [phone] 12/18/2020 3:42 PM

## 2020-12-18 NOTE — Progress Notes (Signed)
Ms Falkner denies chest pain or shortness of breath. Patient tested for Covid 12/17/20 and has been in quarantine since that time.  Ms Brinker reports that she checked BP yesterday, it was 134/85.  Ms. Klinge has type II diabetes, patient s  CBG machine is broken, patient  was able to use someone else's machine yesterday; reading  was 148.  I instructed Ms Houg to not take evening dose of Glipizide on Sunday or Monday am.I also instructed patient to not take Jardaince Sunday or Monday am. Ms Leamer stated that she probably can use someone's CBG machine Monday am.  I Instructed patient if CBG is less than 70 to drink  1/2 cup of a clear juice, apple or cranberry juice. Recheck CBG in 15 minutes, if CBgis not greater than 70 to call pre- op desk at [phone] for further instructions.  I asked Ms Proffit I-f she is still taking HIV medications, she said yes, I told her I need the names and dosage,patient did. Mrs Brame reported that one medication is Descovy 200 mg.  After hanging up with patient that it is a combination medication, I called [facility] on [address] to speak with the pharmacy and get  the correct  Dosage.  It is Decovy 200-25 mg. I entered te medication in Ms Bloch's admission medications.

## 2020-12-18 NOTE — Anesthesia Preprocedure Evaluation (Addendum)
Anesthesia Evaluation  Patient identified by MRN, date of birth, ID band Patient awake    Reviewed: Allergy & Precautions, NPO status , Patient's Chart, lab work & pertinent test results  Airway Mallampati: II  TM Distance: >3 FB Neck ROM: Full    Dental no notable dental hx. (+) Teeth Intact, Dental Advisory Given   Pulmonary asthma , sleep apnea (does not use CPAP) , former smoker,  Sarcoidosis: no recent steroids   Pulmonary exam normal breath sounds clear to auscultation       Cardiovascular hypertension, Pt. on medications Normal cardiovascular exam Rhythm:Regular Rate:Normal  RHC/LHC 03/12/20:: 1. Normal coronary arteries 2. LVEF 60-65% 3. Mild PAH likely due to OSA/OHS 4. Otherwise normal hemodynamics 4. Severe systemic HTN  Echo 02/20/20: IMPRESSIONS  1. Left ventricular ejection fraction, by estimation, is 60 to 65%. The left ventricle has normal function. The left ventricle has no regional wall motion abnormalities. Left ventricular diastolic parameters are consistent with Grade I diastolic dysfunction (impaired relaxation). The average left ventricular global longitudinal strain is -14.4 %.  2. Right ventricular systolic function is normal. The right ventricular size is normal.  3. The mitral valve is normal in structure. Mild mitral valve  regurgitation. No evidence of mitral stenosis.  4. The aortic valve is tricuspid. Aortic valve regurgitation is not visualized. Mild aortic valve sclerosis is present, with no evidence of aortic valve stenosis.  5. The inferior vena cava is normal in size with greater than 50% respiratory variability, suggesting right atrial pressure of 3 mmHg.  - Comparison(s): No significant change from prior study. Prior images reviewed side by side.    Neuro/Psych negative neurological ROS  negative psych ROS   GI/Hepatic Neg liver ROS, GERD  ,  Endo/Other  diabetes (BG 88), Oral  Hypoglycemic AgentsHypothyroidism (Grave's disease s/p thyroidectomy) Morbid obesity (BMI 40)  Renal/GU Renal InsufficiencyRenal disease  negative genitourinary   Musculoskeletal negative musculoskeletal ROS (+)   Abdominal   Peds  Hematology  (+) HIV,   Anesthesia Other Findings   Reproductive/Obstetrics R breast CA                           Anesthesia Physical Anesthesia Plan  ASA: III  Anesthesia Plan: General   Post-op Pain Management:    Induction: Intravenous  PONV Risk Score and Plan: 3 and Midazolam, Dexamethasone and Ondansetron  Airway Management Planned: Oral ETT  Additional Equipment:   Intra-op Plan:   Post-operative Plan: Extubation in OR  Informed Consent: I have reviewed the patients History and Physical, chart, labs and discussed the procedure including the risks, benefits and alternatives for the proposed anesthesia with the patient or authorized representative who has indicated his/her understanding and acceptance.     Dental advisory given  Plan Discussed with: CRNA  Anesthesia Plan Comments: (. )       Anesthesia Quick Evaluation

## 2020-12-18 NOTE — Telephone Encounter (Signed)
Returned patients call. LMVM she will wake up with a binder on after surgery. She will need to keep on 24/7. Can take off to change dressing as needed and put binder back on. If she has drains, she will have to wait until 5 days after surgery and use baby wipes until then. If she does not have drains, she can shower the day after surgery with non fragrance free soap. At her first PO follow up on 12/29/20 at 11:30, Dr. Marla Roe will okay if and when she is ready to wear spanks. Call office back if she has any further questions.

## 2020-12-21 ENCOUNTER — Other Ambulatory Visit: Payer: Self-pay

## 2020-12-21 ENCOUNTER — Encounter (HOSPITAL_COMMUNITY): Payer: Self-pay | Admitting: Plastic Surgery

## 2020-12-21 ENCOUNTER — Ambulatory Visit (HOSPITAL_COMMUNITY): Payer: 59 | Admitting: Vascular Surgery

## 2020-12-21 ENCOUNTER — Telehealth: Payer: Self-pay | Admitting: Surgical

## 2020-12-21 ENCOUNTER — Encounter (HOSPITAL_COMMUNITY)
Admission: RE | Disposition: A | Discharge status: Home / Self Care | Payer: Self-pay | Source: Home / Self Care | Attending: Plastic Surgery

## 2020-12-21 ENCOUNTER — Ambulatory Visit (HOSPITAL_COMMUNITY)
Admission: RE | Admit: 2020-12-21 | Discharge: 2020-12-21 | Disposition: A | Discharge status: Home / Self Care | Payer: 59 | Attending: Plastic Surgery | Admitting: Plastic Surgery

## 2020-12-21 DIAGNOSIS — Z7951 Long term (current) use of inhaled steroids: Secondary | ICD-10-CM | POA: Diagnosis not present

## 2020-12-21 DIAGNOSIS — Z801 Family history of malignant neoplasm of trachea, bronchus and lung: Secondary | ICD-10-CM | POA: Diagnosis not present

## 2020-12-21 DIAGNOSIS — Z803 Family history of malignant neoplasm of breast: Secondary | ICD-10-CM | POA: Insufficient documentation

## 2020-12-21 DIAGNOSIS — Z923 Personal history of irradiation: Secondary | ICD-10-CM | POA: Insufficient documentation

## 2020-12-21 DIAGNOSIS — Z9221 Personal history of antineoplastic chemotherapy: Secondary | ICD-10-CM | POA: Diagnosis not present

## 2020-12-21 DIAGNOSIS — Z87891 Personal history of nicotine dependence: Secondary | ICD-10-CM | POA: Insufficient documentation

## 2020-12-21 DIAGNOSIS — E1122 Type 2 diabetes mellitus with diabetic chronic kidney disease: Secondary | ICD-10-CM | POA: Insufficient documentation

## 2020-12-21 DIAGNOSIS — L732 Hidradenitis suppurativa: Secondary | ICD-10-CM | POA: Diagnosis not present

## 2020-12-21 DIAGNOSIS — Z823 Family history of stroke: Secondary | ICD-10-CM | POA: Insufficient documentation

## 2020-12-21 DIAGNOSIS — I129 Hypertensive chronic kidney disease with stage 1 through stage 4 chronic kidney disease, or unspecified chronic kidney disease: Secondary | ICD-10-CM | POA: Diagnosis not present

## 2020-12-21 DIAGNOSIS — Z8042 Family history of malignant neoplasm of prostate: Secondary | ICD-10-CM | POA: Insufficient documentation

## 2020-12-21 DIAGNOSIS — D869 Sarcoidosis, unspecified: Secondary | ICD-10-CM | POA: Diagnosis not present

## 2020-12-21 DIAGNOSIS — K219 Gastro-esophageal reflux disease without esophagitis: Secondary | ICD-10-CM | POA: Insufficient documentation

## 2020-12-21 DIAGNOSIS — Z8249 Family history of ischemic heart disease and other diseases of the circulatory system: Secondary | ICD-10-CM | POA: Diagnosis not present

## 2020-12-21 DIAGNOSIS — Z21 Asymptomatic human immunodeficiency virus [HIV] infection status: Secondary | ICD-10-CM | POA: Insufficient documentation

## 2020-12-21 DIAGNOSIS — Z7984 Long term (current) use of oral hypoglycemic drugs: Secondary | ICD-10-CM | POA: Insufficient documentation

## 2020-12-21 DIAGNOSIS — N183 Chronic kidney disease, stage 3 unspecified: Secondary | ICD-10-CM | POA: Diagnosis not present

## 2020-12-21 DIAGNOSIS — Z85850 Personal history of malignant neoplasm of thyroid: Secondary | ICD-10-CM | POA: Insufficient documentation

## 2020-12-21 DIAGNOSIS — Z888 Allergy status to other drugs, medicaments and biological substances status: Secondary | ICD-10-CM | POA: Insufficient documentation

## 2020-12-21 DIAGNOSIS — M793 Panniculitis, unspecified: Secondary | ICD-10-CM

## 2020-12-21 DIAGNOSIS — Z841 Family history of disorders of kidney and ureter: Secondary | ICD-10-CM | POA: Diagnosis not present

## 2020-12-21 DIAGNOSIS — E0500 Thyrotoxicosis with diffuse goiter without thyrotoxic crisis or storm: Secondary | ICD-10-CM | POA: Diagnosis not present

## 2020-12-21 HISTORY — PX: PANNICULECTOMY: SHX5360

## 2020-12-21 HISTORY — DX: Chronic kidney disease, unspecified: N18.9

## 2020-12-21 LAB — BASIC METABOLIC PANEL
Anion gap: 10 (ref 5–15)
BUN: 24 mg/dL — ABNORMAL HIGH (ref 6–20)
CO2: 24 mmol/L (ref 22–32)
Calcium: 8.5 mg/dL — ABNORMAL LOW (ref 8.9–10.3)
Chloride: 104 mmol/L (ref 98–111)
Creatinine, Ser: 2.07 mg/dL — ABNORMAL HIGH (ref 0.44–1.00)
GFR, Estimated: 28 mL/min — ABNORMAL LOW (ref >60)
Glucose, Bld: 100 mg/dL — ABNORMAL HIGH (ref 70–99)
Potassium: 3.8 mmol/L (ref 3.5–5.1)
Sodium: 138 mmol/L (ref 135–145)

## 2020-12-21 LAB — CBC
HCT: 35.6 % — ABNORMAL LOW (ref 36.0–46.0)
Hemoglobin: 11.5 g/dL — ABNORMAL LOW (ref 12.0–15.0)
MCH: 32.5 pg (ref 26.0–34.0)
MCHC: 32.3 g/dL (ref 30.0–36.0)
MCV: 100.6 fL — ABNORMAL HIGH (ref 80.0–100.0)
Platelets: 229 10*3/uL (ref 150–400)
RBC: 3.54 MIL/uL — ABNORMAL LOW (ref 3.87–5.11)
RDW: 17.8 % — ABNORMAL HIGH (ref 11.5–15.5)
WBC: 6.8 10*3/uL (ref 4.0–10.5)
nRBC: 0 % (ref 0.0–0.2)

## 2020-12-21 LAB — GLUCOSE, CAPILLARY
Glucose-Capillary: 88 mg/dL (ref 70–99)
Glucose-Capillary: 157 mg/dL — ABNORMAL HIGH (ref 70–99)

## 2020-12-21 SURGERY — PANNICULECTOMY
Anesthesia: General | Site: Abdomen

## 2020-12-21 MED ORDER — BUPIVACAINE-EPINEPHRINE 0.25% -1:200000 IJ SOLN
INTRAMUSCULAR | Status: DC | PRN
  Administered 2020-12-21: 30 mL

## 2020-12-21 MED ORDER — SODIUM CHLORIDE 0.9% FLUSH: 3.0000 mL | INTRAVENOUS | Status: DC | PRN

## 2020-12-21 MED ORDER — KETAMINE HCL 10 MG/ML IJ SOLN
INTRAMUSCULAR | Status: DC | PRN
  Administered 2020-12-21: 30 mg via INTRAVENOUS
  Administered 2020-12-21: 10 mg via INTRAVENOUS

## 2020-12-21 MED ORDER — MIDAZOLAM HCL 2 MG/2ML IJ SOLN
INTRAMUSCULAR | Status: DC | PRN
  Administered 2020-12-21: 2 mg via INTRAVENOUS

## 2020-12-21 MED ORDER — BUPIVACAINE HCL (PF) 0.25 % IJ SOLN
INTRAMUSCULAR | Status: AC
  Filled 2020-12-21: qty 30

## 2020-12-21 MED ORDER — PROPOFOL 10 MG/ML IV BOLUS
INTRAVENOUS | Status: AC
  Filled 2020-12-21: qty 40

## 2020-12-21 MED ORDER — SODIUM CHLORIDE 0.9 % IV SOLN
250.0000 mL | INTRAVENOUS | Status: DC | PRN
Start: 2020-12-21 — End: 2020-12-21

## 2020-12-21 MED ORDER — LIDOCAINE-EPINEPHRINE 1 %-1:100000 IJ SOLN
INTRAMUSCULAR | Status: AC
  Filled 2020-12-21: qty 1

## 2020-12-21 MED ORDER — SUGAMMADEX SODIUM 200 MG/2ML IV SOLN
INTRAVENOUS | Status: DC | PRN
  Administered 2020-12-21: 200 mg via INTRAVENOUS

## 2020-12-21 MED ORDER — ONDANSETRON HCL 4 MG/2ML IJ SOLN
INTRAMUSCULAR | Status: DC | PRN
  Administered 2020-12-21: 4 mg via INTRAVENOUS

## 2020-12-21 MED ORDER — ROCURONIUM BROMIDE 10 MG/ML (PF) SYRINGE
PREFILLED_SYRINGE | INTRAVENOUS | Status: AC
  Filled 2020-12-21: qty 10

## 2020-12-21 MED ORDER — LIDOCAINE 2% (20 MG/ML) 5 ML SYRINGE
INTRAMUSCULAR | Status: DC | PRN
  Administered 2020-12-21: 100 mg via INTRAVENOUS

## 2020-12-21 MED ORDER — VISTASEAL 10 ML SINGLE DOSE KIT
PACK | CUTANEOUS | Status: DC | PRN
  Administered 2020-12-21: 10 mL via TOPICAL

## 2020-12-21 MED ORDER — ORAL CARE MOUTH RINSE: 15.0000 mL | Freq: Once | OROMUCOSAL | Status: AC

## 2020-12-21 MED ORDER — LIDOCAINE 2% (20 MG/ML) 5 ML SYRINGE
INTRAMUSCULAR | Status: AC
  Filled 2020-12-21: qty 5

## 2020-12-21 MED ORDER — KETAMINE HCL 50 MG/5ML IJ SOSY
PREFILLED_SYRINGE | INTRAMUSCULAR | Status: AC
  Filled 2020-12-21: qty 5

## 2020-12-21 MED ORDER — LACTATED RINGERS IV SOLN: INTRAVENOUS | Status: DC

## 2020-12-21 MED ORDER — ACETAMINOPHEN 500 MG PO TABS
1000.0000 mg | ORAL_TABLET | Freq: Once | ORAL | Status: AC
  Administered 2020-12-21: 1000 mg via ORAL
  Filled 2020-12-21: qty 2

## 2020-12-21 MED ORDER — FENTANYL CITRATE (PF) 100 MCG/2ML IJ SOLN
INTRAMUSCULAR | Status: AC
  Filled 2020-12-21: qty 2

## 2020-12-21 MED ORDER — ACETAMINOPHEN 650 MG RE SUPP: 650.0000 mg | RECTAL | Status: DC | PRN

## 2020-12-21 MED ORDER — FENTANYL CITRATE (PF) 250 MCG/5ML IJ SOLN
INTRAMUSCULAR | Status: AC
  Filled 2020-12-21: qty 5

## 2020-12-21 MED ORDER — ACETAMINOPHEN 325 MG PO TABS: 650.0000 mg | ORAL_TABLET | ORAL | Status: DC | PRN

## 2020-12-21 MED ORDER — MIDAZOLAM HCL 2 MG/2ML IJ SOLN
INTRAMUSCULAR | Status: AC
  Filled 2020-12-21: qty 2

## 2020-12-21 MED ORDER — FENTANYL CITRATE (PF) 250 MCG/5ML IJ SOLN
INTRAMUSCULAR | Status: DC | PRN
  Administered 2020-12-21 (×2): 50 ug via INTRAVENOUS
  Administered 2020-12-21: 100 ug via INTRAVENOUS
  Administered 2020-12-21: 50 ug via INTRAVENOUS

## 2020-12-21 MED ORDER — SODIUM CHLORIDE 0.9% FLUSH: 3.0000 mL | Freq: Two times a day (BID) | INTRAVENOUS | Status: DC

## 2020-12-21 MED ORDER — VISTASEAL 10 ML SINGLE DOSE KIT
10.0000 mL | PACK | CUTANEOUS | Status: AC
  Administered 2020-12-21: 10 mL via TOPICAL
  Filled 2020-12-21: qty 10

## 2020-12-21 MED ORDER — PROPOFOL 10 MG/ML IV BOLUS
INTRAVENOUS | Status: DC | PRN
  Administered 2020-12-21: 150 mg via INTRAVENOUS

## 2020-12-21 MED ORDER — ONDANSETRON HCL 4 MG/2ML IJ SOLN
INTRAMUSCULAR | Status: AC
  Filled 2020-12-21: qty 2

## 2020-12-21 MED ORDER — CHLORHEXIDINE GLUCONATE 0.12 % MT SOLN
15.0000 mL | Freq: Once | OROMUCOSAL | Status: AC
  Administered 2020-12-21: 15 mL via OROMUCOSAL
  Filled 2020-12-21: qty 15

## 2020-12-21 MED ORDER — OXYCODONE HCL 5 MG PO TABS
5.0000 mg | ORAL_TABLET | ORAL | Status: DC | PRN
  Administered 2020-12-21: 5 mg via ORAL

## 2020-12-21 MED ORDER — CHLORHEXIDINE GLUCONATE CLOTH 2 % EX PADS: 6.0000 | MEDICATED_PAD | Freq: Once | CUTANEOUS | Status: DC

## 2020-12-21 MED ORDER — FENTANYL CITRATE (PF) 100 MCG/2ML IJ SOLN
25.0000 ug | INTRAMUSCULAR | Status: DC | PRN
  Administered 2020-12-21 (×2): 25 ug via INTRAVENOUS

## 2020-12-21 MED ORDER — LIDOCAINE-EPINEPHRINE 1 %-1:100000 IJ SOLN
INTRAMUSCULAR | Status: DC | PRN
  Administered 2020-12-21: 20 mL

## 2020-12-21 MED ORDER — PHENYLEPHRINE HCL-NACL 10-0.9 MG/250ML-% IV SOLN
INTRAVENOUS | Status: DC | PRN
  Administered 2020-12-21: 10 ug/min via INTRAVENOUS

## 2020-12-21 MED ORDER — SODIUM BICARBONATE 4 % IV SOLN
INTRAVENOUS | Status: DC
  Filled 2020-12-21: qty 50

## 2020-12-21 MED ORDER — 0.9 % SODIUM CHLORIDE (POUR BTL) OPTIME
TOPICAL | Status: DC | PRN
  Administered 2020-12-21: 1000 mL

## 2020-12-21 MED ORDER — LACTATED RINGERS IV SOLN: INTRAVENOUS | Status: DC | PRN

## 2020-12-21 MED ORDER — CEFAZOLIN SODIUM-DEXTROSE 2-4 GM/100ML-% IV SOLN
2.0000 g | INTRAVENOUS | Status: AC
  Administered 2020-12-21: 2 g via INTRAVENOUS
  Filled 2020-12-21: qty 100

## 2020-12-21 MED ORDER — FENTANYL CITRATE (PF) 100 MCG/2ML IJ SOLN: 25.0000 ug | INTRAMUSCULAR | Status: DC | PRN

## 2020-12-21 MED ORDER — ROCURONIUM BROMIDE 10 MG/ML (PF) SYRINGE
PREFILLED_SYRINGE | INTRAVENOUS | Status: DC | PRN
  Administered 2020-12-21: 100 mg via INTRAVENOUS

## 2020-12-21 MED ORDER — OXYCODONE HCL 5 MG PO TABS
ORAL_TABLET | ORAL | Status: AC
  Filled 2020-12-21: qty 1

## 2020-12-21 SURGICAL SUPPLY — 70 items
APPLIER CLIP 9.375 MED OPEN (MISCELLANEOUS)
BAG DECANTER FOR FLEXI CONT (MISCELLANEOUS) IMPLANT
BINDER ABDOMINAL 10 UNV 27-48 (MISCELLANEOUS) IMPLANT
BINDER ABDOMINAL 12 SM 30-45 (SOFTGOODS) IMPLANT
BINDER ABDOMINAL 12 XL 75-84 (SOFTGOODS) ×2 IMPLANT
BIOPATCH RED 1 DISK 7.0 (GAUZE/BANDAGES/DRESSINGS) IMPLANT
BLADE CLIPPER SURG (BLADE) IMPLANT
BLADE HEX COATED 2.75 (ELECTRODE) ×2 IMPLANT
BLADE SURG 10 STRL SS (BLADE) ×2 IMPLANT
BLADE SURG 15 STRL LF DISP TIS (BLADE) IMPLANT
BLADE SURG 15 STRL SS (BLADE)
BNDG GAUZE ELAST 4 BULKY (GAUZE/BANDAGES/DRESSINGS) IMPLANT
CLIP APPLIE 9.375 MED OPEN (MISCELLANEOUS) IMPLANT
CLIP VESOCCLUDE MED 24/CT (CLIP) IMPLANT
COVER WAND RF STERILE (DRAPES) ×2 IMPLANT
DERMABOND ADVANCED (GAUZE/BANDAGES/DRESSINGS) ×2
DERMABOND ADVANCED .7 DNX12 (GAUZE/BANDAGES/DRESSINGS) ×2 IMPLANT
DRAIN CHANNEL 15F RND FF W/TCR (WOUND CARE) IMPLANT
DRAIN CHANNEL 19F RND (DRAIN) IMPLANT
DRAPE HALF SHEET 40X57 (DRAPES) ×4 IMPLANT
DRAPE INCISE IOBAN 66X45 STRL (DRAPES) ×2 IMPLANT
DRESSING PREVENA PLUS CUSTOM (GAUZE/BANDAGES/DRESSINGS) ×1 IMPLANT
DRSG OPSITE POSTOP 4X12 (GAUZE/BANDAGES/DRESSINGS) IMPLANT
DRSG PAD ABDOMINAL 8X10 ST (GAUZE/BANDAGES/DRESSINGS) ×8 IMPLANT
DRSG PREVENA PLUS CUSTOM (GAUZE/BANDAGES/DRESSINGS) ×2
ELECT BLADE 4.0 EZ CLEAN MEGAD (MISCELLANEOUS) ×2
ELECT REM PT RETURN 9FT ADLT (ELECTROSURGICAL) ×2
ELECTRODE BLDE 4.0 EZ CLN MEGD (MISCELLANEOUS) ×1 IMPLANT
ELECTRODE REM PT RTRN 9FT ADLT (ELECTROSURGICAL) ×1 IMPLANT
EVACUATOR SILICONE 100CC (DRAIN) IMPLANT
GAUZE SPONGE 4X4 12PLY STRL (GAUZE/BANDAGES/DRESSINGS) ×2 IMPLANT
GLOVE BIO SURGEON STRL SZ 6.5 (GLOVE) IMPLANT
GLOVE BIO SURGEON STRL SZ7 (GLOVE) IMPLANT
GLOVE SURG ENC MOIS LTX SZ6.5 (GLOVE) ×8 IMPLANT
GLOVE SURG ENC MOIS LTX SZ7.5 (GLOVE) ×4 IMPLANT
GOWN STRL REUS W/ TWL LRG LVL3 (GOWN DISPOSABLE) ×3 IMPLANT
GOWN STRL REUS W/TWL LRG LVL3 (GOWN DISPOSABLE) ×6
KIT BASIN OR (CUSTOM PROCEDURE TRAY) ×2 IMPLANT
KIT PUMP PREVENA PLUS 14DAY (MISCELLANEOUS) ×2 IMPLANT
NDL SAFETY ECLIPSE 18X1.5 (NEEDLE) IMPLANT
NEEDLE HYPO 18GX1.5 SHARP (NEEDLE)
NEEDLE HYPO 25X1 1.5 SAFETY (NEEDLE) ×2 IMPLANT
NS IRRIG 1000ML POUR BTL (IV SOLUTION) ×2 IMPLANT
PACK GENERAL/GYN (CUSTOM PROCEDURE TRAY) ×2 IMPLANT
PACK UNIVERSAL I (CUSTOM PROCEDURE TRAY) ×2 IMPLANT
PENCIL SMOKE EVACUATOR (MISCELLANEOUS) ×2 IMPLANT
PIN SAFETY STERILE (MISCELLANEOUS) ×2 IMPLANT
SLEEVE SCD COMPRESS KNEE MED (STOCKING) ×2 IMPLANT
SPONGE LAP 18X18 RF (DISPOSABLE) ×2 IMPLANT
STAPLER VISISTAT 35W (STAPLE) ×2 IMPLANT
STRIP CLOSURE SKIN 1/2X4 (GAUZE/BANDAGES/DRESSINGS) ×4 IMPLANT
SUT ETHIBOND 0 MO6 C/R (SUTURE) IMPLANT
SUT MNCRL AB 4-0 PS2 18 (SUTURE) ×16 IMPLANT
SUT MON AB 3-0 SH 27 (SUTURE) ×8
SUT MON AB 3-0 SH27 (SUTURE) ×4 IMPLANT
SUT MON AB 5-0 PS2 18 (SUTURE) ×4 IMPLANT
SUT PDS AB 2-0 CT1 27 (SUTURE) ×2 IMPLANT
SUT PDS AB 3-0 SH 27 (SUTURE) ×10 IMPLANT
SUT SILK 3 0 SH 30 (SUTURE) IMPLANT
SUT VIC AB 3-0 SH 27 (SUTURE) ×2
SUT VIC AB 3-0 SH 27X BRD (SUTURE) ×1 IMPLANT
SUT VICRYL 4-0 PS2 18IN ABS (SUTURE) ×8 IMPLANT
SYR BULB IRRIG 60ML STRL (SYRINGE) IMPLANT
SYR CONTROL 10ML LL (SYRINGE) ×2 IMPLANT
TOWEL GREEN STERILE FF (TOWEL DISPOSABLE) ×2 IMPLANT
TRAY FOL W/BAG SLVR 16FR STRL (SET/KITS/TRAYS/PACK) IMPLANT
TRAY FOLEY W/BAG SLVR 14FR LF (SET/KITS/TRAYS/PACK) ×2 IMPLANT
TRAY FOLEY W/BAG SLVR 16FR LF (SET/KITS/TRAYS/PACK)
TUBE CONNECTING 20X1/4 (TUBING) IMPLANT
UNDERPAD 30X36 HEAVY ABSORB (UNDERPADS AND DIAPERS) ×4 IMPLANT

## 2020-12-21 NOTE — Transfer of Care (Signed)
Immediate Anesthesia Transfer of Care Note  Patient: [name]  Procedure(s) Performed: PANNICULECTOMY (N/A Abdomen)  Patient Location: PACU  Anesthesia Type:General  Level of Consciousness: drowsy  Airway & Oxygen Therapy: Patient Spontanous Breathing and Patient connected to face mask oxygen  Post-op Assessment: Report given to RN and Post -op Vital signs reviewed and stable  Post vital signs: Reviewed and stable  Last Vitals:  Vitals Value Taken Time  BP 188/98 12/21/20 1019  Temp    Pulse 68 12/21/20 1024  Resp 14 12/21/20 1024  SpO2 95 % 12/21/20 1024  Vitals shown include unvalidated device data.  Last Pain:  Vitals:   12/21/20 0613  TempSrc: Oral  PainSc: 0-No pain         Complications: No complications documented.

## 2020-12-21 NOTE — Discharge Instructions (Addendum)
INSTRUCTIONS FOR AFTER SURGERY   You will likely have some questions about what to expect following your operation.  The following information will help you and your family understand what to expect when you are discharged from the hospital.  Following these guidelines will help ensure a smooth recovery and reduce risks of complications.  Postoperative instructions include information on: diet, wound care, medications and physical activity.  AFTER SURGERY Expect to go home after the procedure.  In some cases, you may need to spend one night in the hospital for observation.  DIET This surgery does not require a specific diet.  However, I have to mention that the healthier you eat the better your body can start healing. It is important to increasing your protein intake.  This means limiting the foods with added sugar.  Focus on fruits and vegetables and some meat. It is very important to drink water after your surgery.  If your urine is bright yellow, then it is concentrated, and you need to drink more water.  As a general rule after surgery, you should have 8 ounces of water every hour while awake.  If you find you are persistently nauseated or unable to take in liquids let us know.  NO TOBACCO USE or EXPOSURE.  This will slow your healing process and increase the risk of a wound.  WOUND CARE You can shower once the [facility] is removed. Once you start showering, use fragrance free soap.  Dial, [place], [place] and Cetaphil are usually mild on the skin.  If you have steri-strips / tape directly attached to your skin leave them in place. It is OK to get these wet.  No baths, pools or hot tubs for two weeks. We close your incision to leave the smallest and best-looking scar. No ointment or creams on your incisions until given the go ahead.  Especially not Neosporin (Too many skin reactions with this one).  A few weeks after surgery you can use Mederma and start massaging the scar. We ask you to wear your binder or  sports bra for the first 6 weeks around the clock, including while sleeping. This provides added comfort and helps reduce the fluid accumulation at the surgery site.  ACTIVITY No heavy lifting until cleared by the doctor.  It is OK to walk and climb stairs. In fact, moving your legs is very important to decrease your risk of a blood clot.  It will also help keep you from getting deconditioned.  Every 1 to 2 hours get up and walk for 5 minutes. This will help with a quicker recovery back to normal.  Let pain be your guide so you don't do too much.  NO, you cannot do the spring cleaning and don't plan on taking care of anyone else.  This is your time for TLC.   WORK Everyone returns to work at different times. As a rough guide, most people take at least 1 - 2 weeks off prior to returning to work. If you need documentation for your job, bring the forms to your postoperative follow up visit.  DRIVING Arrange for someone to bring you home from the hospital.  You may be able to drive a few days after surgery but not while taking any narcotics or valium.  BOWEL MOVEMENTS Constipation can occur after anesthesia and while taking pain medication.  It is important to stay ahead for your comfort.  We recommend taking Milk of Magnesia (2 tablespoons; twice a day) while taking the pain  pills.  SEROMA This is fluid your body tried to put in the surgical site.  This is normal but if it creates excessive pain and swelling let us know.  It usually decreases in a few weeks.  MEDICATIONS and PAIN CONTROL At your preoperative visit for you history and physical you were given the following medications: 1. An antibiotic: Start this medication when you get home and take according to the instructions on the bottle. 2. Zofran 4 mg:  This is to treat nausea and vomiting.  You can take this every 6 hours as needed and only if needed. 3. Norco (hydrocodone/acetaminophen) 5/325 mg:  This is only to be used after you have  taken the motrin or the tylenol. Every 8 hours as needed. Over the counter Medication to take: 4. Ibuprofen (Motrin) 600 mg:  Take this every 6 hours.  If you have additional pain then take 500 mg of the tylenol.  Only take the Norco after you have tried these two. 5. Miralax or stool softener of choice: Take this according to the bottle if you take the [place] Call your surgeon's office if any of the following occur: . Fever 101 degrees F or greater . Excessive bleeding or fluid from the incision site. . Pain that increases over time without aid from the medications . Redness, warmth, or pus draining from incision sites . Persistent nausea or inability to take in liquids . Severe misshapen area that underwent the operation.  [facility] Specialist  What is the benefit of having a drain?  During surgery your tissue layers are separated.  This raw surface stimulates your body to fill the space with serous fluid.  This is normal but you don't want that fluid to collect and prevent healing.  A fluid collection can also become infected.  The Jackson-Pratt (JP) drain is used to eliminate this collection of fluid and allow the tissue to heal together.    Jackson-Pratt (JP) bulb    How to care for your drainage and suction unit at home Your drainage catheter will be connected to a collection device. The vacuum caused when the device is compressed allows drainage to collect in the device.    Wendee Copp your hands with soap and water before and after touching the system. . Empty the JP drain every 12 hours once you get home from your procedure. . Record the fluid amount on the record sheet included. . Start with stripping the drain tube to push the clots or excess fluid to the bulb.  Do this by pinching the tube with one hand near your skin.  Then with the other hand squeeze the tubing and work it toward the bulb.  This should be done several times a day.  This may collapse the  tube which will correct on its own.   . Use a safety pin to attach your collection device to your clothing so there is no tension on the insertion site.   . If you have drainage at the skin insertion site, you can apply a gauze dressing and secure it with tape. . If the drain falls out, apply a gauze dressing over the drain insertion site and secure with tape.   To empty the collection device:   . Release the stopper on the top of the collection unit (bulb).  Signa Kell contents into a measuring container such as a plastic medicine cup.  . Record the day and amount of drainage on  the attached sheet. . This should be done at least twice a day.    To compress the Jackson-Pratt Bulb:  . Release the stopper at the top of the bulb. Marland Kitchen Squeeze the bulb tightly in your fist, squeezing air out of the bulb.  . Replace the stopper while the bulb is compressed.  . Be careful not to spill the contents when squeezing the bulb. . The drainage will start bright red and turn to pink and then yellow with time. . IMPORTANT: If the bulb is not squeezed before adding the stopper it will not draw out the fluid.  Care for the JP drain site and your skin daily:  . You may shower after the [facility] is removed. . Secure the drain to a ribbon or cloth around your waist while showering so it does not pull out while showering. . Be sure your hands are cleaned with soap and water. . Use a clean wet cotton swab to clean the skin around the drain site.  . Use another cotton swab to place Vaseline or antibiotic ointment on the skin around the drain.     Contact your physician if any of the following occur:  Marland Kitchen The fluid in the bulb becomes cloudy. . Your temperature is greater than 101.4.  Marland Kitchen The incision opens. . If you have drainage at the skin insertion site, you can apply a gauze dressing and secure it with tape. . If the drain falls out, apply a gauze dressing over the drain insertion site and secure with tape.  . You will  usually have more drainage when you are active than while you rest or are asleep. If the drainage increases significantly or is bloody call the physician                             Bring this record with you to each office visit Date  Drainage Volume  Date   Drainage volume

## 2020-12-21 NOTE — Interval H&P Note (Signed)
History and Physical Interval Note:  12/21/2020 7:11 AM  [name]  has presented today for surgery, with the diagnosis of panniculitis.  The various methods of treatment have been discussed with the patient and family. After consideration of risks, benefits and other options for treatment, the patient has consented to  Procedure(s) with comments: PANNICULECTOMY (N/A) - 4 hours, please as a surgical intervention.  The patient's history has been reviewed, patient examined, no change in status, stable for surgery.  I have reviewed the patient's chart and labs.  Questions were answered to the patient's satisfaction.     [name]

## 2020-12-21 NOTE — Op Note (Signed)
Operative Report  Date of operation: 12/21/2020  Patient: [name], MRN: [id number], 55 y.o. female.   Date of birth: Apr 11, 1966  Location: [facility]  Preoperative Diagnosis: Panniculitis  Postoperative Diagnosis:  Same  Procedure: Panniculectomy and placement of a prevena vac  Surgeon:  Theodoro Kos Kyree Fedorko  Assistant:  Roetta Sessions, PA  Anesthesia:  General  EBL:  250cc  Drains:  2 number 4 blake round drains  Condition:  Stable  Complications: None  Disposition: Recovery Room  Procedure in Detail: Patient was seen the morning of her surgery and marked out for the procedure. She was then given an IV and IV antibiotics. The patient was taken to the operating room and underwent general anesthesia.  A time out was called and all information was confirmed to be correct. SCD's and a pillow under the knees was in place. The patient was then prepped and draped in the standard sterile fashion. Local was placed into the incision sites. The planned lower incision was then incised and the incision taken down through the Scarpa's fascia to the rectus abdominus fascia. The skin and subcutaneous tissue was then lifted off the fascia up to the level of the umbilicus. The umbilicus is then circumscribed and freed up from the surrounding skin and freed down to the abdominal wall. The abdominal wall was then freed above the umbilicus for several centimeters for a tension free closure.  The amount to be excised was confirmed. The pannus was excised and it weighed 3914 grams. The mons was also suspended with 3-0 PDS.  The wound was irrigated with normal saline solution. Tissue adhesive was placed to help reduce seroma formation and encourage coaptation of the tissue layers. The new position of the umbilicus was determined and a small v shaped incision placed in the abdominal wall. The umbilicus was secured with 4- 4.0 vicryl at 12, 3,6, and 9 o'clock. It was inset from  the dermis to the abdominal wall and then run closed with 5-0 Monocryl in a subcuticular fashion. The abdominal wall was closed with buried 3-0 PDS and Monocryl and subcuticular 4-0 Monocryl.    The wound was then dressed with some dermabond. A prevena was applied and secured.  ABD's and an abdominal binder were placed. Patient was allowed to wake up, extubated and taken on a stretcher in the flexed position to the recovery room. Family was notified at the end of the case.   The advanced practice practitioner (APP) assisted throughout the case.  The APP was essential in retraction and counter traction when needed to make the case progress smoothly.  This retraction and assistance made it possible to see the tissue plans for the procedure.  The assistance was needed for blood control, tissue re-approximation and assisted with closure of the incision site.

## 2020-12-21 NOTE — Telephone Encounter (Signed)
Patient's sister called to advise that patient had surgery this morning and the pain meds are not working. Last dose was given at 1:19pm but the pain is still severe. She was advised to call the office if the meds were not working. [facility] [address]. You can call sister back to advise when prescription has been called in. [phone]

## 2020-12-21 NOTE — Anesthesia Procedure Notes (Signed)
Procedure Name: Intubation Performed by: Valda Favia, CRNA Pre-anesthesia Checklist: Patient identified, Emergency Drugs available, Suction available and Patient being monitored Patient Re-evaluated:Patient Re-evaluated prior to induction Oxygen Delivery Method: Circle System Utilized Preoxygenation: Pre-oxygenation with 100% oxygen Induction Type: IV induction Ventilation: Mask ventilation without difficulty and Oral airway inserted - appropriate to patient size Laryngoscope Size: Mac and 4 Grade View: Grade II Tube type: Oral Tube size: 7.0 mm Number of attempts: 1 Airway Equipment and Method: Stylet and Oral airway Placement Confirmation: ETT inserted through vocal cords under direct vision,  positive ETCO2 and breath sounds checked- equal and bilateral Secured at: 22 cm Tube secured with: Tape Dental Injury: Teeth and Oropharynx as per pre-operative assessment

## 2020-12-22 ENCOUNTER — Encounter (HOSPITAL_COMMUNITY): Payer: Self-pay | Admitting: Plastic Surgery

## 2020-12-22 NOTE — Anesthesia Postprocedure Evaluation (Signed)
Anesthesia Post Note  Patient: [name]  Procedure(s) Performed: PANNICULECTOMY (N/A Abdomen)     Patient location during evaluation: PACU Anesthesia Type: General Level of consciousness: awake and alert Pain management: pain level controlled Vital Signs Assessment: post-procedure vital signs reviewed and stable Respiratory status: spontaneous breathing, nonlabored ventilation, respiratory function stable and patient connected to nasal cannula oxygen Cardiovascular status: blood pressure returned to baseline and stable Postop Assessment: no apparent nausea or vomiting Anesthetic complications: no   No complications documented.  Last Vitals:  Vitals:   12/21/20 1100 12/21/20 1105  BP:  (!) 173/92  Pulse:  60  Resp:  12  Temp:  (!) 36.3 C  SpO2: (!) 88% 93%    Last Pain:  Vitals:   12/21/20 1105  TempSrc:   PainSc: 5                  Cotton Beckley L Pawel Soules

## 2020-12-22 NOTE — Telephone Encounter (Signed)
Matt spoke with patient

## 2020-12-25 ENCOUNTER — Telehealth: Payer: Self-pay | Admitting: Plastic Surgery

## 2020-12-25 NOTE — Telephone Encounter (Signed)
Returned patients call. LMVM, advised to call the 1-800 number on wound vac for [employer] and they can provide instructions what  the blinking yellow light means and what to do.

## 2020-12-25 NOTE — Telephone Encounter (Signed)
Patient's sister, Share, called to advise that one of the arrows on the wound vac is blinking yellow. She said it goes off and then comes back on. They just want to make sure it is nothing to be concerned about. She has appt on Tuesday, March 15th. Call patient to advise if she should be concerned.

## 2020-12-28 ENCOUNTER — Telehealth: Payer: Self-pay | Admitting: Pharmacist

## 2020-12-28 ENCOUNTER — Telehealth: Payer: Self-pay | Admitting: Plastic Surgery

## 2020-12-28 MED ORDER — CANDESARTAN CILEXETIL 32 MG PO TABS: 32.0000 mg | ORAL_TABLET | Freq: Every day | ORAL | 11 refills | Status: DC

## 2020-12-28 NOTE — Telephone Encounter (Signed)
°*  STAT* If patient is at the pharmacy, call can be transferred to refill team.   1. Which medications need to be refilled? (please list name of each medication and dose if known) candesartan (ATACAND) 32 MG tablet  2. Which pharmacy/location (including street and city if local pharmacy) is medication to be sent to? [id number] [place] [place]  3. Do they need a 30 day or 90 day supply? [place]

## 2020-12-28 NOTE — Telephone Encounter (Signed)
Called and spoke with the patient regarding the message below.  Patient stated that she started having symptoms on Friday for yeast infection-(burning while urinating,discharge,and itching).    She stated that she gets yeast infections from taking antibiotic.    Informed the patient that Dr. Marla Roe was not in the office, but I can ask her on tomorrow.    Patient stated that she has an appointment with Dr. Marla Roe on (Tuesday-12/29/20), but she did not want to wait to get something for the yeast infection.  She stated that the infection might get bad.  Informed her that I can speak with Dr. Claudia Desanctis regarding her message and give her a call back.  Patient verbalized understanding and agreed.   Called the patient back and left a message @ (3:48p) asking the patient to give me a call back.//AB/CMA

## 2020-12-28 NOTE — Telephone Encounter (Signed)
S/w pt confirmed refill # [id number] sent to requested pharmacy.

## 2020-12-28 NOTE — Telephone Encounter (Signed)
Patient called to say she has developed a yeast infection from the antibiotics and would like something to be called in to her pharmacy. [facility] [address]. Please call patient to advise.

## 2020-12-29 ENCOUNTER — Other Ambulatory Visit: Payer: Self-pay

## 2020-12-29 ENCOUNTER — Ambulatory Visit (INDEPENDENT_AMBULATORY_CARE_PROVIDER_SITE_OTHER): Payer: 59 | Admitting: Plastic Surgery

## 2020-12-29 ENCOUNTER — Encounter: Payer: Self-pay | Admitting: Plastic Surgery

## 2020-12-29 VITALS — BP 120/81 | HR 101

## 2020-12-29 DIAGNOSIS — M793 Panniculitis, unspecified: Secondary | ICD-10-CM

## 2020-12-29 MED ORDER — FLUCONAZOLE 150 MG PO TABS: 150.0000 mg | ORAL_TABLET | Freq: Once | ORAL | 0 refills | Status: AC

## 2020-12-29 NOTE — Progress Notes (Signed)
The patient is a 55 year old female here for follow-up on her panniculectomy.  Overall she is doing extremely well.  The VAC was in place until late last night when it became dislodged from the sponge.  We were able to remove that today and the incision is intact.  Patient thinks she may have a yeast infection.  She is requested Diflucan that seems to work for her in the past.  No sign of infection.  I will go ahead and send in a prescription for the Diflucan for her she knows to call me if there is no improvement.

## 2021-01-03 ENCOUNTER — Other Ambulatory Visit: Payer: Self-pay | Admitting: Internal Medicine

## 2021-01-04 ENCOUNTER — Telehealth: Payer: Self-pay

## 2021-01-04 NOTE — Telephone Encounter (Signed)
Patient was seen in the office on (12/29/20) and a prescription for Diflucan was sent to the pharmacy.//AB/CMA

## 2021-01-04 NOTE — Telephone Encounter (Signed)
Do you want to continue patient's doxycycline?

## 2021-01-04 NOTE — Telephone Encounter (Signed)
Call from pt with the following concern:  She noted red/blood drainage right lower abdominal area upon rising this am.  Dressings were saturated and she identified that there is an area of the incision- right lateral abdominal/proximal edge appears to have an "opening/stitch" that is no longer intact. She has had chills/no fever, nausea & decreased appetite- but no vomiting.  She is drinking fluids.  Normal bowel/bladder function. Denies pain.   She reinforced the dressing/tape but did not reapply the abdominal binder.  She had another episode of the bleeding this afternoon after being up/walking- but denies any strenuous activity & no lifting/pulling.  her niece who is a LPN- assessed the wound while on the phone with me  & she thinks a stitch may have come out- but she feels as if it is bleeding from the "skin edge"- she could not identify a definite opening.  She does think that reinforcing the gauze/tape & she will reapply the binder for compression will control the bleeding.   I instructed the patient & her family member that we can see her today if needed, but if any of her symptoms worsen- she should go to the ED for more immediate care if necessary.  Patient and her niece want to wait until her appointment tomorrow. I confirmed that pt has a f/u with Dr. Marla Roe tomorrow @ 11:30am. She is to call for any other concerns

## 2021-01-04 NOTE — Telephone Encounter (Signed)
Error

## 2021-01-05 ENCOUNTER — Other Ambulatory Visit: Payer: Self-pay

## 2021-01-05 ENCOUNTER — Ambulatory Visit (INDEPENDENT_AMBULATORY_CARE_PROVIDER_SITE_OTHER): Payer: 59 | Admitting: Surgical

## 2021-01-05 ENCOUNTER — Encounter: Payer: Self-pay | Admitting: Surgical

## 2021-01-05 VITALS — BP 124/76 | HR 81

## 2021-01-05 DIAGNOSIS — M793 Panniculitis, unspecified: Secondary | ICD-10-CM

## 2021-01-05 NOTE — Progress Notes (Signed)
Patient is a 55 year old female here for follow-up on her panniculectomy.  She underwent panniculectomy by Dr. Marla Roe on 12/21/2020.  She is 2 weeks postop.  She did call the office yesterday noting that she noticed some drainage and bleeding from her right lower abdominal area.  She discussed this with the nursing staff.   Today she reports that she is having some chills and nausea, as well as a decreased appetite.  She reports no vomiting, diarrhea, fevers, chills.  She has been receiving assistance at home from family.  She is not having any shortness of breath or chest pain  Chaperone present on exam On exam the lateral portions of the panniculectomy incisions are intact, umbilical incisions are intact.  No umbilicus necrosis is noted.  She does have dehiscence of the midline incision as well as some sloughing of the superior epithelium with exposed pink epithelium noted.  I was able to express some hematoma from the right medial incision.  It was approximately 5 cc.  I do not see any cellulitic changes.  No erythema is noted.  No purulence or fluctuance is noted/palpated.  I recommend Vaseline and gauze to the entire midline portion of the abdomen.  I discussed with her that she has a little bit of what appears to be a hematoma along the right medial aspect.  I discussed with her that this will continue to drain, I also discussed with her that she has some dehiscence of the midline incision.  All of her questions were answered.  I recommend she wear her abdominal binder 24/7 for compression.  Continue to avoid strenuous activity.  Continue to avoid any heavy lifting.  I do not see any sign of infection.  Pictures were obtained of the patient and placed in the chart with the patient's or guardian's permission.

## 2021-01-06 ENCOUNTER — Inpatient Hospital Stay (HOSPITAL_COMMUNITY): Admission: RE | Admit: 2021-01-06 | Payer: 59 | Source: Ambulatory Visit | Admitting: Internal Medicine

## 2021-01-06 ENCOUNTER — Telehealth: Payer: Self-pay | Admitting: *Deleted

## 2021-01-06 DIAGNOSIS — M1A09X0 Idiopathic chronic gout, multiple sites, without tophus (tophi): Secondary | ICD-10-CM

## 2021-01-06 MED ORDER — PREDNISONE 20 MG PO TABS: ORAL_TABLET | ORAL | 0 refills | Status: DC

## 2021-01-06 NOTE — Telephone Encounter (Signed)
Patient left a voicemail stating that she is having a gout flare-up and would like for Allie Bossier NP to send in some Prednisone for her. Called patient back and was advised that Anda Kraft has treated her for the gout before. Patient stated that it is always her big toe on the left foot. Patient stated that her toe started getting sore Sunday night and has progressively gotten worse. Pharmacy [facility]

## 2021-01-06 NOTE — Telephone Encounter (Signed)
Noted.  Will send in a dose of prednisone for the flare. Please notify her that if she experiences another flare then we need to recheck uric acid levels and kidney function and increase her allopurinol if possible.

## 2021-01-07 NOTE — Telephone Encounter (Signed)
Called patient reviewed all information and repeated back to me. Will call if any questions.  ? ?

## 2021-01-10 NOTE — Progress Notes (Signed)
[name]  [phone]) [phone] Fax:(336) [phone]    ID: [name] DOB: [birth year]  MR#: [id number]  XHB#:[id number]  Patient Care Team: [name], NP as PCP - General (Internal Medicine) Michel Bickers, MD as PCP - Infectious Diseases (Infectious Diseases) Magrinat, Virgie Dad, MD as Consulting Physician (Oncology) Kyung Rudd, MD as Consulting Physician (Radiation Oncology) Erroll Luna, MD as Consulting Physician (General Surgery) Alexis Frock, MD as Consulting Physician (Urology) Bensimhon, Shaune Pascal, MD as Consulting Physician (Cardiology) Elmarie Shiley, MD as Consulting Physician (Nephrology) OTHER MD:   CHIEF COMPLAINT: Triple positive breast cancer  CURRENT TREATMENT: tamoxifen   INTERVAL HISTORY: [name] returns today for follow-up of her triple positive breast cancer.    She continues on tamoxifen.  She has hot flashes and vaginal discharge associated with this.  Since her last visit, she had her port removed on 07/16/2020.  She also underwent surveillance thyroid ultrasound on 08/03/2020 showing: expected changes from a total thyroidectomy; no evidence of residual thyroid tissue.  She also had pap smear on 09/28/2020. This was negative.  She underwent panniculectomy on 12/21/2020 under Dr. Marla Roe.  She has felt tired since the procedure.  She also has a little area of dehiscence in the middle of the wound.   REVIEW OF SYSTEMS: [name] has had multiple problems with boils.  She is on suppressive doxycycline.  She feels very tired.  She has been staying home all this time and says today is the first day since her surgery that she went out.  She has occasional headaches.  She has had no nausea or vomiting.  She has significant neuropathy issues and says she has had falls x2.  Her gout is also acting up.  A detailed review of systems today was otherwise stable   COVID 19 VACCINATION STATUS: [place] x2, most recently  01/2020   HISTORY OF CURRENT ILLNESS: From the original intake note:  Felicia Cornelia Aune (pronounced "Huff") palpated a mass in the right breast for about 2 weeks before bringing it to medical attention. She underwent bilateral diagnostic mammography with tomography and right breast ultrasonography at [facility] on 04/20/2018 showing: breast density category B. There was a highly suspicious mass located at 3 o'clock in the upper inner quadrant measuring 2.9 x 2.5 x 1.6 cm and 4 cm from the nipple. Ultrasound of the right axilla showed 5 lymph nodes with abnormal cortical thickening. The left axilla showed 3 lymph nodes with abnormal cortical thickening.  Accordingly on 04/26/2018 she proceeded to biopsy of the right breast area in question. The pathology from this procedure showed ([id number]): Invasive ductal carcinoma, grade II with calcifications. One right axillary lymph node and one left axillary lymph node were negative for carcinoma. Prognostic indicators significant for: estrogen receptor, 100% positive and progesterone receptor, 80% positive, both with strong staining intensity. Proliferation marker Ki67 at 70%. HER2 amplified with ratios HER2/CEP17 signals 4.52 and average HER2 copies per cell 10.85  The patient's subsequent history is as detailed below.   PAST MEDICAL HISTORY: Past Medical History:  Diagnosis Date  . Allergy   . Anemia    Normocytic  . Anxiety   . Asthma   . Blood dyscrasia   . Bronchitis 2005  . CKD (chronic kidney disease)   . 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 Diabetes mellitus without complication ([place])   . Family history of breast cancer   . Family history of lung cancer   . Family history of prostate cancer   .  Gastroenteritis 07/10/07  . GERD 07/24/2006  . GRAVE'S DISEASE 01/01/2008  . History of hidradenitis suppurativa   . History of kidney stones   . History of thrush   . HIV DISEASE 07/24/2006   dx March 05  . Hyperlipidemia    . HYPERTENSION 07/24/2006  . Hyperthyroidism 08/2006   Grave's Disease -diffuse radiotracer uptake 08/25/06 Thyroid scan-Cold nodule to R lower lobe of thyrorid  . Menometrorrhagia    hx of  . Nephrolithiasis   . 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HCC)    METASTATIC PAPILLARY THYROID CARCINOMA per 01/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  . Personal history of chemotherapy    2020  . Personal history of radiation therapy    2020  . Pneumonia 2005  . 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 Sarcoidosis 02/08/2007   dx as a teenager in [place] from abnl CXR. Completed 2 yrs Prednisone after lung bx confirmation. No symptoms since then.  . Suppurative hidradenitis   . Thyroid cancer (HCC)   . THYROID NODULE, RIGHT 02/08/2007  -2018 thyroid nodules were precancerous but pathology was negative for thyroid cancer.    PAST SURGICAL HISTORY: Past Surgical History:  Procedure Laterality Date  . BREAST EXCISIONAL BIOPSY Right 04/26/2018   right axilla negative  . BREAST EXCISIONAL BIOPSY Left 04/26/2018   left axilla negative  . BREAST LUMPECTOMY Right 10/03/2018   malignant  . BREAST LUMPECTOMY WITH RADIOACTIVE SEED AND SENTINEL LYMPH NODE BIOPSY Right 10/03/2018   Procedure: RIGHT BREAST LUMPECTOMY WITH RADIOACTIVE SEED AND SENTINEL LYMPH NODE MAPPING;  Surgeon: Harriette Bouillon, MD;  Location: [facility];  Service: General;  Laterality: Right;  . BREAST SURGERY  1997   Breast Reduction   . CYSTOSCOPY W/ URETERAL STENT REMOVAL  11/09/2012   Procedure: CYSTOSCOPY WITH STENT REMOVAL;  Surgeon: Sebastian Ache, MD;  Location: [facility];  Service: Urology;  Laterality: Right;  . CYSTOSCOPY WITH RETROGRADE PYELOGRAM, URETEROSCOPY AND STENT PLACEMENT  11/09/2012   Procedure: CYSTOSCOPY WITH RETROGRADE PYELOGRAM, URETEROSCOPY AND STENT PLACEMENT;  Surgeon: Sebastian Ache, MD;  [facility];  Service:  Urology;  Laterality: Left;  LEFT URETEROSCOPY, STONE MANIPULATION, left STENT exchange   . CYSTOSCOPY WITH STENT PLACEMENT  10/02/2012   Procedure: CYSTOSCOPY WITH STENT PLACEMENT;  Surgeon: Sebastian Ache, MD;  [facility]: [facility];  Service: Urology;  Laterality: Left;  . DILATION AND CURETTAGE OF UTERUS  Feb 2004   s/p for 1st trimester nonviable pregnancy  . EYE SURGERY     sty under eyelid  . INCISE AND DRAIN ABCESS  Nov 03   s/p I &D for righ inframmary fold hidradenitis  . INCISION AND DRAINAGE PERITONSILLAR ABSCESS  Mar 03  . IR CV LINE INJECTION  06/07/2018  . IR IMAGING GUIDED PORT INSERTION  06/20/2018  . IR REMOVAL TUN ACCESS W/ PORT W/O FL MOD SED  06/20/2018  . IRRIGATION AND DEBRIDEMENT ABSCESS  01/31/2012   Procedure: IRRIGATION AND DEBRIDEMENT ABSCESS;  Surgeon: Kandis Cocking, MD;  [facility];  Service: General;  Laterality: Right;  right breast and axilla   . NEPHROLITHOTOMY  10/02/2012   Procedure: NEPHROLITHOTOMY PERCUTANEOUS;  Surgeon: Sebastian Ache, MD;  [facility];  Service: Urology;  Laterality: Right;  First Stage Percutaneous Nephrolithotomy with Surgeon Access, Left Ureteral Stent    . NEPHROLITHOTOMY  10/04/2012   Procedure: NEPHROLITHOTOMY PERCUTANEOUS SECOND LOOK;  Surgeon: Sebastian Ache, MD;  [facility];  Service: Urology;  Laterality: Right;     . NEPHROLITHOTOMY  10/08/2012   Procedure: NEPHROLITHOTOMY PERCUTANEOUS;  Surgeon: Alexis Frock, MD;  [facility];  Service: Urology;  Laterality: Right;  THIRD STAGE, nephrostomy tube exchange x 2  . NEPHROLITHOTOMY  10/11/2012   Procedure: NEPHROLITHOTOMY PERCUTANEOUS SECOND LOOK;  Surgeon: Alexis Frock, MD;  [facility];  Service: Urology;  Laterality: Right;  RIGHT 4 STAGE PERCUTANOUS NEPHROLITHOTOMY, right URETEROSCOPY WITH HOLMIUM LASER   . PANNICULECTOMY N/A 12/21/2020   Procedure: PANNICULECTOMY;  Surgeon: Wallace Going, DO;  Location: [facility];  Service: Plastics;  Laterality:  N/A;  . PORT-A-CATH REMOVAL N/A 07/16/2020   Procedure: REMOVAL PORT-A-CATH;  Surgeon: Erroll Luna, MD;  Location: [facility];  Service: General;  Laterality: N/A;  . PORTACATH PLACEMENT Left 05/17/2018   Procedure: INSERTION PORT-A-CATH;  Surgeon: Coralie Keens, MD;  Location: [facility];  Service: General;  Laterality: Left;  . RADICAL NECK DISSECTION  04/12/2017   limited/notes 04/12/2017  . RADICAL NECK DISSECTION N/A 04/12/2017   Procedure: RADICAL NECK DISSECTION;  Surgeon: Melida Quitter, MD;  Location: [facility];  Service: ENT;  Laterality: N/A;  limited neck dissection 2 hours total  . REDUCTION MAMMAPLASTY Bilateral 1998  . RIGHT/LEFT HEART CATH AND CORONARY ANGIOGRAPHY N/A 03/12/2020   Procedure: RIGHT/LEFT HEART CATH AND CORONARY ANGIOGRAPHY;  Surgeon: Jolaine Artist, MD;  Location: [facility];  Service: Cardiovascular;  Laterality: N/A;  . [place]  1994  . THYROIDECTOMY  04/12/2017   completion/notes 04/12/2017  . THYROIDECTOMY N/A 04/12/2017   Procedure: THYROIDECTOMY;  Surgeon: Melida Quitter, MD;  Location: [facility];  Service: ENT;  Laterality: N/A;  Completion Thyroidectomy  . TOTAL THYROIDECTOMY  2010  Thyroid nodules removed- pathology at the ENT doctor 2018 were benign -Over active thyroid and thyroidectomy with benign pathology   FAMILY HISTORY Family History  Problem Relation Age of Onset  . Hypertension Mother   . Cancer Mother        laryngeal  . Heart disease Mother        stent  . Hypertension Father   . Lung cancer Father 83       hx smoking  . Heart disease Other   . Hypertension Other   . Stroke Other        Grandparent  . Kidney disease Other        Grandparent  . Diabetes Other        FH of Diabetes  . Hypertension Sister   . Cancer Maternal Uncle        Lung CA  . Hypertension Brother   . Hypertension Sister   . Breast cancer Maternal Aunt 65  . Breast cancer Paternal Aunt 42  . Prostate cancer Paternal  Uncle   . Breast cancer Maternal Aunt        dx 60+  . Breast cancer Paternal Aunt        dx 24's  . Breast cancer Paternal Aunt        dx 74's  . Prostate cancer Paternal Uncle   . Lung cancer Paternal Uncle   . Breast cancer Cousin 72  . Breast cancer Cousin        dx <50  . Breast cancer Cousin        dx <50  . Breast cancer Cousin        dx <50  . Colon cancer Neg Hx   . Esophageal cancer Neg Hx   .  Rectal cancer Neg Hx   . Stomach cancer Neg Hx   The patient's mother is alive at age 82. The patient's father died at 71 from lung cancer (heavy smoker). She does not know much about her father. The patient has 1 brother and 2 sisters. The patient's mother had a history of laryngeal cancer. There was a maternal cousin with breast cancer diagnosed at age 23. There were 2 maternal aunts with breast cancer, the youngest being diagnosed in the 48's. There was a maternal great aunt with breast cancer. There were 2 paternal aunts with breast cancer.    GYNECOLOGIC HISTORY:  Patient's last menstrual period was 03/31/2014. Menarche: 55 years old Age at first live birth: 55 years old She is GX P1.  Her LMP was in 2017. She did not use HRT.    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 She is single. At home is the patient's daughter, [name], age 66, who is a Engineer, technical sales education and working in Therapist, art.  The patient attends [employer].   ADVANCED DIRECTIVES: Not in place; at the 05/10/2018 visit the patient was given the appropriate documents to complete and notarized at her discretion   HEALTH MAINTENANCE: Social History   Tobacco Use  . Smoking status: Former Smoker    Packs/day: 0.50    Years: 15.00    Pack years: 7.50    Types: Cigarettes    Start date: 04/12/2017    Quit date: 2019    Years since quitting: 3.2  . Smokeless tobacco: Never Used  Vaping Use  . Vaping Use:  Never used  Substance Use Topics  . Alcohol use: Yes    Alcohol/week: 0.0 standard drinks    Comment: social  . Drug use: No     Colonoscopy: no  PAP: 09/2020, negative  Bone density: remote   Allergies  Allergen Reactions  . Genvoya [Elviteg-Cobic-Emtricit-Tenofaf] Hives  . Lisinopril Cough    Current Outpatient Medications  Medication Sig Dispense Refill  . acetaminophen (TYLENOL) 500 MG tablet Take 1 tablet (500 mg total) by mouth every 6 (six) hours as needed. For use AFTER surgery (Patient taking differently: Take 500 mg by mouth every 6 (six) hours as needed for moderate pain. For use AFTER surgery) 30 tablet 0  . albuterol (VENTOLIN HFA) 108 (90 Base) MCG/ACT inhaler Inhale 2 puffs into the lungs every 6 (six) hours as needed for wheezing or shortness of breath. 8 g 2  . allopurinol (ZYLOPRIM) 100 MG tablet Take 1 tablet (100 mg total) by mouth daily. For gout prevention. 90 tablet 0  . bictegravir-emtricitabine-tenofovir AF (BIKTARVY) 50-200-25 MG TABS tablet Take 1 tablet by mouth daily. 30 tablet 11  . Blood Glucose Monitoring Suppl (FREESTYLE LITE) w/Device KIT Inject 1 each into the skin daily. 1 kit 0  . candesartan (ATACAND) 32 MG tablet Take 1 tablet (32 mg total) by mouth daily. 30 tablet 11  . carvedilol (COREG) 25 MG tablet Take 1 tablet (25 mg total) by mouth 2 (two) times daily. 180 tablet 3  . cetirizine (ZYRTEC) 10 MG tablet Take 1 tablet by mouth at bedtime for allergies. 90 tablet 0  . Cholecalciferol (VITAMIN D) 50 MCG (2000 UT) tablet Take 4,000 Units by mouth daily.    . dolutegravir ([name]) 50 MG tablet Take 50 mg by mouth daily.    Marland Kitchen doxycycline (VIBRA-TABS) 100 MG tablet Take 100 mg by mouth daily.    Marland Kitchen  empagliflozin (JARDIANCE) 10 MG TABS tablet Take 1 tablet (10 mg total) by mouth daily before breakfast. 30 tablet 6  . emtricitabine-tenofovir AF (DESCOVY) 200-25 MG tablet Take 1 tablet by mouth daily.    . famotidine (PEPCID) 20 MG tablet Take 1 or 2  tablets by mouth at bedtime for hoarse voice/reflux 180 tablet 0  . FLOVENT HFA 110 MCG/ACT inhaler TAKE 1 PUFF BY MOUTH TWICE A DAY (Patient taking differently: Inhale 1 puff into the lungs 2 (two) times daily as needed (shortness of breath).) 36 Inhaler 1  . gabapentin (NEURONTIN) 300 MG capsule Take 1 capsule (300 mg total) by mouth 3 (three) times daily. (Patient taking differently: Take 300 mg by mouth daily.) 90 capsule 3  . glipiZIDE (GLUCOTROL) 5 MG tablet TAKE 1 TABLET (5 MG TOTAL) BY MOUTH 2 (TWO) TIMES DAILY BEFORE A MEAL. FOR DIABETES. (Patient taking differently: Take 5 mg by mouth 2 (two) times daily before a meal.) 180 tablet 1  . glucose blood (ONETOUCH VERIO) test strip USE AS INSTRUCTED TO TEST BLOOD SUGAR DAILY 100 each 2  . Lancets (ONETOUCH ULTRASOFT) lancets Use as instructed to test blood sugar daily 100 each 5  . levothyroxine (SYNTHROID) 112 MCG tablet TAKE 1 TABLET BY MOUTH DAILY BEFORE BREAKFAST. (Patient taking differently: Take 112 mcg by mouth daily before breakfast.) 30 tablet 5  . ondansetron (ZOFRAN-ODT) 4 MG disintegrating tablet Take 1 tablet (4 mg total) by mouth every 8 (eight) hours as needed for nausea or vomiting. 20 tablet 0  . potassium chloride SA (KLOR-CON) 20 MEQ tablet Take 1 tablet (20 mEq total) by mouth 2 (two) times daily. For low potassium. 10 tablet 0  . predniSONE (DELTASONE) 20 MG tablet Take two tablets my mouth once daily for four days, then one tablet once daily for four days. 12 tablet 0  . rosuvastatin (CRESTOR) 10 MG tablet TAKE 1 TABLET BY MOUTH EVERY DAY FOR CHOLESTEROL (Patient taking differently: Take 10 mg by mouth daily.) 90 tablet 3  . sertraline (ZOLOFT) 50 MG tablet TAKE 1 TABLET (50 MG TOTAL) BY MOUTH DAILY. FOR ANXIETY AND DEPRESSION. 90 tablet 1  . spironolactone (ALDACTONE) 25 MG tablet Take 1 tablet (25 mg total) by mouth daily. 90 tablet 3  . tamoxifen (NOLVADEX) 20 MG tablet TAKE 1 TABLET BY MOUTH EVERY DAY (Patient taking  differently: Take 20 mg by mouth daily.) 90 tablet 4  . TRULICITY 9.60 AV/4.0JW SOPN INJECT CONTENTS OF 1 PEN (0.$Remove'75MG'JOsXDPD$ ) INTO THE SKIN ONCE WEEKLY FOR DIABETES (Patient taking differently: Inject 0.75 mg as directed every Sunday.) 2 mL 3   No current facility-administered medications for this visit.   Facility-Administered Medications Ordered in Other Visits  Medication Dose Route Frequency Provider Last Rate Last Admin  . sodium chloride flush (NS) 0.9 % injection 10 mL  10 mL Intracatheter PRN Magrinat, Virgie Dad, MD   10 mL at 09/19/18 1551    OBJECTIVE: African-American woman who was tearful during today's visit  Vitals:   01/11/21 0953  BP: (!) 177/86  Pulse: 70  Resp: 16  Temp: (!) 97.5 F (36.4 C)  SpO2: 100%   Wt Readings from Last 3 Encounters:  01/11/21 224 lb 6.4 oz (101.8 kg)  12/15/20 242 lb (109.8 kg)  12/01/20 238 lb 6.4 oz (108.1 kg)   Body mass index is 37.34 kg/m.    ECOG FS:1 - Symptomatic but completely ambulatory  Sclerae unicteric, EOMs intact Wearing a mask No cervical or supraclavicular adenopathy Lungs  no rales or rhonchi Heart regular rate and rhythm Abd soft, nontender, positive bowel sounds MSK no focal spinal tenderness, no upper extremity lymphedema Neuro: nonfocal, well oriented, depressed affect Breasts: The right breast has undergone lumpectomy and radiation.  There are significant skin changes bilaterally secondary to the patient's history of boils but there is no evidence of local recurrence.  Both axillae are benign Skin: There is an area of dehiscence in the central aspect of her panniculectomy incision.  There is no evidence of infection.  LAB RESULTS:  CMP     Component Value Date/Time   NA 138 12/21/2020 0559   NA 143 06/26/2020 0000   K 3.8 12/21/2020 0559   CL 104 12/21/2020 0559   CO2 24 12/21/2020 0559   GLUCOSE 100 (H) 12/21/2020 0559   BUN 24 (H) 12/21/2020 0559   BUN 25 (A) 06/26/2020 0000   CREATININE 2.07 (H)  12/21/2020 0559   CREATININE 1.98 (H) 08/12/2019 1447   CREATININE 1.02 09/23/2015 1047   CALCIUM 8.5 (L) 12/21/2020 0559   PROT 7.5 07/14/2020 1053   ALBUMIN 3.1 (L) 07/14/2020 1053   AST 29 07/14/2020 1053   AST 24 08/12/2019 1447   ALT 26 07/14/2020 1053   ALT 16 08/12/2019 1447   ALKPHOS 88 07/14/2020 1053   BILITOT 0.3 07/14/2020 1053   BILITOT 0.2 (L) 08/12/2019 1447   GFRNONAA 28 (L) 12/21/2020 0559   GFRNONAA 29 (L) 08/12/2019 1447   GFRAA 39 (L) 07/14/2020 1053   GFRAA 33 (L) 08/12/2019 1447    No results found for: TOTALPROTELP, ALBUMINELP, A1GS, A2GS, BETS, BETA2SER, GAMS, MSPIKE, SPEI  No results found for: KPAFRELGTCHN, LAMBDASER, KAPLAMBRATIO  Lab Results  Component Value Date   WBC 9.0 01/11/2021   NEUTROABS 4.8 01/11/2021   HGB 10.7 (L) 01/11/2021   HCT 33.3 (L) 01/11/2021   MCV 97.9 01/11/2021   PLT 326 01/11/2021   No results found for: LABCA2  No components found for: PIRJJO841  No results for input(s): INR in the last 168 hours.  No results found for: LABCA2  No results found for: YSA630  No results found for: ZSW109  No results found for: NAT557  No results found for: CA2729  No components found for: HGQUANT  No results found for: CEA1 / No results found for: CEA1   No results found for: AFPTUMOR  No results found for: CHROMOGRNA  No results found for: HGBA, HGBA2QUANT, HGBFQUANT, HGBSQUAN (Hemoglobinopathy evaluation)   No results found for: LDH  Lab Results  Component Value Date   IRON 26 (L) 02/08/2007   TIBC 352 06/26/2020   IRONPCTSAT 15 06/26/2020   (Iron and TIBC)  Lab Results  Component Value Date   FERRITIN 141 06/26/2020    Urinalysis    Component Value Date/Time   COLORURINE RED (A) 12/10/2019 1145   APPEARANCEUR TURBID (A) 12/10/2019 1145   LABSPEC 1.017 12/10/2019 1145   PHURINE < OR = 5.0 12/10/2019 1145   GLUCOSEU NEGATIVE 12/10/2019 1145   GLUCOSEU NEG mg/dL 01/23/2008 1418   HGBUR 2+ (A)  12/10/2019 1145   BILIRUBINUR neg 09/28/2020 1106   KETONESUR NEGATIVE 12/10/2019 1145   PROTEINUR Positive (A) 09/28/2020 1106   PROTEINUR 3+ (A) 12/10/2019 1145   UROBILINOGEN 0.2 09/28/2020 1106   UROBILINOGEN 0.2 10/21/2014 2347   NITRITE neg 09/28/2020 1106   NITRITE POSITIVE (A) 12/10/2019 1145   LEUKOCYTESUR Moderate (2+) (A) 09/28/2020 1106   LEUKOCYTESUR 3+ (A) 12/10/2019 1145    STUDIES: No  results found.   ELIGIBLE FOR AVAILABLE RESEARCH PROTOCOL: declined DCP study  ASSESSMENT: 55 y.o. [place], [place] woman status post right breast upper inner quadrant biopsy 04/26/2018 for a clinical T2 N0, stage IB invasive ductal carcinoma, grade 2, ER/PR positive, HER-2 positive with an MIB-1 of 70%.  (1) genetics testing 07/03/2018: no pathogenic mutations. Genes Analyzed (43 total): APC*, ATM*, AXIN2, BARD1, BMPR1A, BRCA1*, BRCA2*, BRIP1*, CDH1*, CDK4, CDKN2A, CHEK2*, DICER1,GALNT12, HOXB13, MEN1, MLH1*, MRE11A, MSH2*, MSH3, MSH6*, MUTYH*, NBN, NF1*, NTHL1, PALB2*, PMS2*, POLD1, POLE,PTEN*, RAD50, RAD51C*, RAD51D*, RET, SDHB, SDHD, SMAD4, SMARCA4, STK11 and TP53* (sequencing and deletion/duplication); EGFR (sequencing only); EPCAM and GREM1 (deletion/duplication only). DNA and RNA analyses performed for * genes.  (2) neoadjuvant chemotherapy consisting of carboplatin, docetaxel, trastuzumab and pertuzumab given every 21 days x 6, starting 05/31/2018, completed 09/14/2018  (a) Pertuzumab discontinued starting with cycle 2 because of diarrhea problems  (b) Taxotere stopped after 4 cycles due peripheral neuropathy and substituted for last two cycles with gemcitabine  (3)  definitive surgery on 10/03/2018 showed residual ypT1c ypN0 invasive ductal carcinoma, grade 2, with negative margins; a total of 5 sentinel lymph nodes removed; repeat prognostic panel again triple positive  (4) T-DM1 given adjuvantly due to residual disease: started 11/13/2018, completed 11/04/2019.  (a) echo on  05/16/2018 shows an EF of 60-65%.  (b) echo on 08/27/18 shows an EF 55-60%  (c) echo on 12/03/2018 shows an EF of 60-65%  (d) echocardiogram on 03/06/2019 showed an ejection fraction of 55% as read by Dr. Gala Romney.  (e) echocardiogram 06/20/2019 shows an ejection fraction in the 55-60% range  (f) echo 02/20/2020 shows an ejection fraction in the 60-65% range.  (5) adjuvant radiation completed 01/07/2019 - 02/21/2019 (delayed due to wound healing issues)  Site/dose:   The patient initially received a dose of 50.4 Gy in 28 fractions to the right breast using whole-breast tangent fields. This was delivered using a 3-D conformal technique. The patient then received a boost to the seroma. This delivered an additional 10 Gy in 5 fractions using 12e electrons with a special teletherapy technique. The total dose was 60.4 Gy.  (6) tamoxifen started 02/28/2019  (7) hidradenitis/boils: on doxycycline    PLAN: [name] has gone through a lot and feels overwhelmed.  She feels like the weight of the world is on her.  There is not any problems that she cannot solve including financial problems and physical problems.  I do think that from a breast cancer point of view she will do well long-term and I did reassure her in that regard.  She is willing to continue on tamoxifen which I think is the best medicine for her and she will have mammography in September.  I am going to see her in October and from that point we will start seeing her on a once a year basis  Total encounter time 25 minutes.  Valentino Hue. Magrinat, MD  01/11/21 10:01 AM Medical Oncology and Hematology [facility] [address] [place], [place] [address] Tel. [phone]    Fax. [phone]   I, Mickie Bail, am acting as scribe for Dr. Valentino Hue. Magrinat.  I, Ruthann Cancer MD, have reviewed the above documentation for accuracy and completeness, and I agree with the above.   *Total Encounter Time as defined by  the Centers for Medicare and Medicaid Services includes, in addition to the face-to-face time of a patient visit (documented in the note above) non-face-to-face time: obtaining and reviewing outside history, ordering and  reviewing medications, tests or procedures, care coordination (communications with other health care professionals or caregivers) and documentation in the medical record.  

## 2021-01-11 ENCOUNTER — Other Ambulatory Visit: Payer: Self-pay

## 2021-01-11 ENCOUNTER — Inpatient Hospital Stay: Payer: 59

## 2021-01-11 ENCOUNTER — Inpatient Hospital Stay: Payer: 59 | Attending: Adult Health | Admitting: Oncology

## 2021-01-11 VITALS — BP 177/86 | HR 70 | Temp 97.5°F | Resp 16 | Ht 65.0 in | Wt 224.4 lb

## 2021-01-11 DIAGNOSIS — Z87891 Personal history of nicotine dependence: Secondary | ICD-10-CM | POA: Insufficient documentation

## 2021-01-11 DIAGNOSIS — C50211 Malignant neoplasm of upper-inner quadrant of right female breast: Secondary | ICD-10-CM | POA: Diagnosis present

## 2021-01-11 DIAGNOSIS — Z170 Estrogen receptor positive status [ER+]: Secondary | ICD-10-CM | POA: Diagnosis not present

## 2021-01-11 DIAGNOSIS — C50311 Malignant neoplasm of lower-inner quadrant of right female breast: Secondary | ICD-10-CM | POA: Diagnosis not present

## 2021-01-11 DIAGNOSIS — Z79810 Long term (current) use of selective estrogen receptor modulators (SERMs): Secondary | ICD-10-CM | POA: Diagnosis not present

## 2021-01-11 DIAGNOSIS — C73 Malignant neoplasm of thyroid gland: Secondary | ICD-10-CM

## 2021-01-11 LAB — COMPREHENSIVE METABOLIC PANEL
ALT: 15 U/L (ref 0–44)
AST: 21 U/L (ref 15–41)
Albumin: 3.2 g/dL — ABNORMAL LOW (ref 3.5–5.0)
Alkaline Phosphatase: 71 U/L (ref 38–126)
Anion gap: 11 (ref 5–15)
BUN: 28 mg/dL — ABNORMAL HIGH (ref 6–20)
CO2: 25 mmol/L (ref 22–32)
Calcium: 8.6 mg/dL — ABNORMAL LOW (ref 8.9–10.3)
Chloride: 108 mmol/L (ref 98–111)
Creatinine, Ser: 2.03 mg/dL — ABNORMAL HIGH (ref 0.44–1.00)
GFR, Estimated: 29 mL/min — ABNORMAL LOW (ref >60)
Glucose, Bld: 114 mg/dL — ABNORMAL HIGH (ref 70–99)
Potassium: 3.7 mmol/L (ref 3.5–5.1)
Sodium: 144 mmol/L (ref 135–145)
Total Bilirubin: 0.3 mg/dL (ref 0.3–1.2)
Total Protein: 8 g/dL (ref 6.5–8.1)

## 2021-01-11 LAB — CBC WITH DIFFERENTIAL/PLATELET
Abs Immature Granulocytes: 0.4 10*3/uL — ABNORMAL HIGH (ref 0.00–0.07)
Basophils Absolute: 0.1 10*3/uL (ref 0.0–0.1)
Basophils Relative: 1 %
Eosinophils Absolute: 0.1 10*3/uL (ref 0.0–0.5)
Eosinophils Relative: 1 %
HCT: 33.3 % — ABNORMAL LOW (ref 36.0–46.0)
Hemoglobin: 10.7 g/dL — ABNORMAL LOW (ref 12.0–15.0)
Immature Granulocytes: 4 %
Lymphocytes Relative: 32 %
Lymphs Abs: 2.9 10*3/uL (ref 0.7–4.0)
MCH: 31.5 pg (ref 26.0–34.0)
MCHC: 32.1 g/dL (ref 30.0–36.0)
MCV: 97.9 fL (ref 80.0–100.0)
Monocytes Absolute: 0.9 10*3/uL (ref 0.1–1.0)
Monocytes Relative: 10 %
Neutro Abs: 4.8 10*3/uL (ref 1.7–7.7)
Neutrophils Relative %: 52 %
Platelets: 326 10*3/uL (ref 150–400)
RBC: 3.4 MIL/uL — ABNORMAL LOW (ref 3.87–5.11)
RDW: 16.9 % — ABNORMAL HIGH (ref 11.5–15.5)
WBC: 9 10*3/uL (ref 4.0–10.5)
nRBC: 0 % (ref 0.0–0.2)

## 2021-01-12 ENCOUNTER — Ambulatory Visit: Payer: 59

## 2021-01-12 ENCOUNTER — Telehealth: Payer: Self-pay | Admitting: Oncology

## 2021-01-12 NOTE — Telephone Encounter (Signed)
Scheduled appts per 3/28 los. Pt aware.

## 2021-01-13 ENCOUNTER — Other Ambulatory Visit: Payer: Self-pay

## 2021-01-13 ENCOUNTER — Ambulatory Visit (INDEPENDENT_AMBULATORY_CARE_PROVIDER_SITE_OTHER): Payer: 59 | Admitting: Surgical

## 2021-01-13 ENCOUNTER — Telehealth (HOSPITAL_COMMUNITY): Payer: Self-pay | Admitting: Cardiology

## 2021-01-13 ENCOUNTER — Encounter: Payer: Self-pay | Admitting: Surgical

## 2021-01-13 VITALS — BP 140/90 | HR 74

## 2021-01-13 DIAGNOSIS — M793 Panniculitis, unspecified: Secondary | ICD-10-CM

## 2021-01-13 NOTE — Telephone Encounter (Signed)
[facility] records requested from 06/17/20-present Detailed message records are not available with our office during that time Pt no showed for appt Ph # [phone]

## 2021-01-13 NOTE — Progress Notes (Addendum)
Patient is a 55 year old female here for follow-up on her panniculectomy on 12/21/2020 with Dr. Marla Roe. She is a diabetic with most recent A1c in her chart being 8.0.  She also has chronic kidney disease.  She has had some midline incisional dehiscence and sloughing of the epithelium.  She has not had any infectious symptoms.  She does report that she has been feeling fatigued after surgery and has had a poor appetite.  She is doing her best to eat and drink as healthy as possible.  She reports she has been eating oatmeal.  Chaperone present on exam On exam her panniculectomy incision is dehiscing at the central portion, the area is approximately 7 x 3.5 cm x 0.5 cm.  She has some sloughing of the epithelium with fibrinous exudate and necrosis of the superficial epithelium.  There is no purulent drainage noted.  She does have some serosanguineous drainage noted.  The umbilicus is intact and there is no necrosis noted of this.  The lateral portions of the incisions are healing well and intact.  I recommend wet-to-dry dressing changes to the midline abdominal wound.  I do not see any sign of overt infection.  I did discuss with her that she may need to return to the operating room for debridement and primary closure of the midline incisional dehiscence.  I discussed with her that this is something I would consult with Dr. Marla Roe about and see what her thoughts were.  I discussed with her I would call her this afternoon to further discuss.    Recommend eating as healthy as possible, avoiding any sugars or carbs.  Recommend drinking plenty of fluids.  Pictures were obtained of the patient and placed in the chart with the patient's or guardian's permission.  Addendum: Will plan to return to OR in the next week or 2 to debride and possibly close the incision primarily or apply a wound vac. Will work on ordering wound vac and discuss this with patient.

## 2021-01-14 ENCOUNTER — Telehealth: Payer: Self-pay

## 2021-01-14 NOTE — Telephone Encounter (Addendum)
Called [facility] to discuss wound VAC for patient. VM was full, sent text to call office.  01/15/2021-Faxed signed order to [facility] for Wound VAC. Surgery is on 01/20/2021 at [place].  Deliver by 01/19/2021 to patients home address if able.

## 2021-01-15 ENCOUNTER — Encounter (HOSPITAL_BASED_OUTPATIENT_CLINIC_OR_DEPARTMENT_OTHER): Payer: Self-pay | Admitting: Plastic Surgery

## 2021-01-15 ENCOUNTER — Telehealth: Payer: Self-pay

## 2021-01-15 ENCOUNTER — Other Ambulatory Visit: Payer: Self-pay

## 2021-01-15 NOTE — Telephone Encounter (Signed)
Received voicemail from [place] regarding faxed request for medical records for dates of service 06/17/20-present. Release of information request is in front office waiting to be processed by medical records. RN attempted to call [employer] back to let them know request has been received, no answer.    Beryle Flock, RN

## 2021-01-15 NOTE — Telephone Encounter (Signed)
error 

## 2021-01-18 ENCOUNTER — Telehealth: Payer: Self-pay

## 2021-01-18 ENCOUNTER — Other Ambulatory Visit (HOSPITAL_COMMUNITY)
Admission: RE | Admit: 2021-01-18 | Discharge: 2021-01-18 | Disposition: A | Discharge status: Home / Self Care | Payer: 59 | Source: Ambulatory Visit | Attending: Plastic Surgery | Admitting: Plastic Surgery

## 2021-01-18 DIAGNOSIS — Z20822 Contact with and (suspected) exposure to covid-19: Secondary | ICD-10-CM | POA: Diagnosis not present

## 2021-01-18 DIAGNOSIS — Z01812 Encounter for preprocedural laboratory examination: Secondary | ICD-10-CM | POA: Diagnosis present

## 2021-01-18 LAB — SARS CORONAVIRUS 2 (TAT 6-24 HRS): SARS Coronavirus 2: NEGATIVE

## 2021-01-18 NOTE — Telephone Encounter (Signed)
Returned patients call. Advised her that we will send a prescription for pain prior to her surgery at [place].

## 2021-01-18 NOTE — Telephone Encounter (Signed)
Patient called to see if we would be calling in prescription pain medication for her to pick up before her surgery on Wednesday, 01/20/2021.

## 2021-01-19 ENCOUNTER — Other Ambulatory Visit: Payer: Self-pay

## 2021-01-19 ENCOUNTER — Telehealth: Payer: Self-pay | Admitting: Primary Care

## 2021-01-19 ENCOUNTER — Other Ambulatory Visit (INDEPENDENT_AMBULATORY_CARE_PROVIDER_SITE_OTHER): Payer: 59

## 2021-01-19 ENCOUNTER — Encounter (HOSPITAL_BASED_OUTPATIENT_CLINIC_OR_DEPARTMENT_OTHER)
Admission: RE | Admit: 2021-01-19 | Discharge: 2021-01-19 | Disposition: A | Discharge status: Home / Self Care | Payer: 59 | Source: Ambulatory Visit | Attending: Plastic Surgery | Admitting: Plastic Surgery

## 2021-01-19 DIAGNOSIS — Z21 Asymptomatic human immunodeficiency virus [HIV] infection status: Secondary | ICD-10-CM | POA: Diagnosis not present

## 2021-01-19 DIAGNOSIS — Z87891 Personal history of nicotine dependence: Secondary | ICD-10-CM | POA: Diagnosis not present

## 2021-01-19 DIAGNOSIS — N189 Chronic kidney disease, unspecified: Secondary | ICD-10-CM | POA: Diagnosis not present

## 2021-01-19 DIAGNOSIS — Z888 Allergy status to other drugs, medicaments and biological substances status: Secondary | ICD-10-CM | POA: Diagnosis not present

## 2021-01-19 DIAGNOSIS — T8189XA Other complications of procedures, not elsewhere classified, initial encounter: Secondary | ICD-10-CM | POA: Diagnosis not present

## 2021-01-19 DIAGNOSIS — E785 Hyperlipidemia, unspecified: Secondary | ICD-10-CM | POA: Diagnosis not present

## 2021-01-19 DIAGNOSIS — Z85850 Personal history of malignant neoplasm of thyroid: Secondary | ICD-10-CM | POA: Diagnosis not present

## 2021-01-19 DIAGNOSIS — M1A9XX0 Chronic gout, unspecified, without tophus (tophi): Secondary | ICD-10-CM

## 2021-01-19 DIAGNOSIS — Z8249 Family history of ischemic heart disease and other diseases of the circulatory system: Secondary | ICD-10-CM | POA: Diagnosis not present

## 2021-01-19 DIAGNOSIS — Z833 Family history of diabetes mellitus: Secondary | ICD-10-CM | POA: Diagnosis not present

## 2021-01-19 DIAGNOSIS — Z01812 Encounter for preprocedural laboratory examination: Secondary | ICD-10-CM | POA: Insufficient documentation

## 2021-01-19 DIAGNOSIS — I129 Hypertensive chronic kidney disease with stage 1 through stage 4 chronic kidney disease, or unspecified chronic kidney disease: Secondary | ICD-10-CM | POA: Diagnosis not present

## 2021-01-19 DIAGNOSIS — E1122 Type 2 diabetes mellitus with diabetic chronic kidney disease: Secondary | ICD-10-CM | POA: Diagnosis not present

## 2021-01-19 DIAGNOSIS — E890 Postprocedural hypothyroidism: Secondary | ICD-10-CM | POA: Diagnosis not present

## 2021-01-19 DIAGNOSIS — X58XXXA Exposure to other specified factors, initial encounter: Secondary | ICD-10-CM | POA: Diagnosis not present

## 2021-01-19 LAB — BASIC METABOLIC PANEL
Anion gap: 7 (ref 5–15)
BUN: 22 mg/dL — ABNORMAL HIGH (ref 6–20)
CO2: 23 mmol/L (ref 22–32)
Calcium: 8.7 mg/dL — ABNORMAL LOW (ref 8.9–10.3)
Chloride: 105 mmol/L (ref 98–111)
Creatinine, Ser: 2.08 mg/dL — ABNORMAL HIGH (ref 0.44–1.00)
GFR, Estimated: 28 mL/min — ABNORMAL LOW (ref >60)
Glucose, Bld: 147 mg/dL — ABNORMAL HIGH (ref 70–99)
Potassium: 4.1 mmol/L (ref 3.5–5.1)
Sodium: 135 mmol/L (ref 135–145)

## 2021-01-19 LAB — URIC ACID: Uric Acid, Serum: 5.8 mg/dL (ref 2.4–7.0)

## 2021-01-19 MED ORDER — HYDROCODONE-ACETAMINOPHEN 7.5-325 MG PO TABS: 1.0000 | ORAL_TABLET | Freq: Four times a day (QID) | ORAL | 0 refills | Status: AC | PRN

## 2021-01-19 NOTE — Telephone Encounter (Signed)
Placed in form folder for your signature

## 2021-01-19 NOTE — Telephone Encounter (Signed)
Post-op pain meds for surgery tomorrow.

## 2021-01-19 NOTE — Telephone Encounter (Signed)
Patient came requesting handicap placard renewed

## 2021-01-20 ENCOUNTER — Encounter (HOSPITAL_BASED_OUTPATIENT_CLINIC_OR_DEPARTMENT_OTHER)
Admission: RE | Disposition: A | Discharge status: Home / Self Care | Payer: Self-pay | Source: Home / Self Care | Attending: Plastic Surgery

## 2021-01-20 ENCOUNTER — Other Ambulatory Visit: Payer: Self-pay

## 2021-01-20 ENCOUNTER — Ambulatory Visit (HOSPITAL_BASED_OUTPATIENT_CLINIC_OR_DEPARTMENT_OTHER): Payer: 59 | Admitting: Anesthesiology

## 2021-01-20 ENCOUNTER — Encounter (HOSPITAL_BASED_OUTPATIENT_CLINIC_OR_DEPARTMENT_OTHER): Payer: Self-pay | Admitting: Plastic Surgery

## 2021-01-20 ENCOUNTER — Ambulatory Visit (HOSPITAL_BASED_OUTPATIENT_CLINIC_OR_DEPARTMENT_OTHER)
Admission: RE | Admit: 2021-01-20 | Discharge: 2021-01-20 | Disposition: A | Discharge status: Home / Self Care | Payer: 59 | Attending: Plastic Surgery | Admitting: Plastic Surgery

## 2021-01-20 DIAGNOSIS — Z21 Asymptomatic human immunodeficiency virus [HIV] infection status: Secondary | ICD-10-CM | POA: Insufficient documentation

## 2021-01-20 DIAGNOSIS — I129 Hypertensive chronic kidney disease with stage 1 through stage 4 chronic kidney disease, or unspecified chronic kidney disease: Secondary | ICD-10-CM | POA: Diagnosis not present

## 2021-01-20 DIAGNOSIS — S31109A Unspecified open wound of abdominal wall, unspecified quadrant without penetration into peritoneal cavity, initial encounter: Secondary | ICD-10-CM

## 2021-01-20 DIAGNOSIS — Z85850 Personal history of malignant neoplasm of thyroid: Secondary | ICD-10-CM | POA: Insufficient documentation

## 2021-01-20 DIAGNOSIS — Z87891 Personal history of nicotine dependence: Secondary | ICD-10-CM | POA: Insufficient documentation

## 2021-01-20 DIAGNOSIS — E1122 Type 2 diabetes mellitus with diabetic chronic kidney disease: Secondary | ICD-10-CM | POA: Insufficient documentation

## 2021-01-20 DIAGNOSIS — X58XXXA Exposure to other specified factors, initial encounter: Secondary | ICD-10-CM | POA: Insufficient documentation

## 2021-01-20 DIAGNOSIS — E785 Hyperlipidemia, unspecified: Secondary | ICD-10-CM | POA: Insufficient documentation

## 2021-01-20 DIAGNOSIS — T8189XA Other complications of procedures, not elsewhere classified, initial encounter: Secondary | ICD-10-CM | POA: Insufficient documentation

## 2021-01-20 DIAGNOSIS — Z833 Family history of diabetes mellitus: Secondary | ICD-10-CM | POA: Insufficient documentation

## 2021-01-20 DIAGNOSIS — E890 Postprocedural hypothyroidism: Secondary | ICD-10-CM | POA: Insufficient documentation

## 2021-01-20 DIAGNOSIS — Z8249 Family history of ischemic heart disease and other diseases of the circulatory system: Secondary | ICD-10-CM | POA: Insufficient documentation

## 2021-01-20 DIAGNOSIS — N189 Chronic kidney disease, unspecified: Secondary | ICD-10-CM | POA: Diagnosis not present

## 2021-01-20 DIAGNOSIS — Z888 Allergy status to other drugs, medicaments and biological substances status: Secondary | ICD-10-CM | POA: Insufficient documentation

## 2021-01-20 DIAGNOSIS — T8149XA Infection following a procedure, other surgical site, initial encounter: Secondary | ICD-10-CM

## 2021-01-20 HISTORY — PX: DEBRIDEMENT AND CLOSURE WOUND: SHX5614

## 2021-01-20 HISTORY — PX: APPLICATION OF WOUND VAC: SHX5189

## 2021-01-20 LAB — GLUCOSE, CAPILLARY
Glucose-Capillary: 121 mg/dL — ABNORMAL HIGH (ref 70–99)
Glucose-Capillary: 125 mg/dL — ABNORMAL HIGH (ref 70–99)

## 2021-01-20 SURGERY — DEBRIDEMENT, WOUND, WITH CLOSURE
Anesthesia: General | Site: Abdomen

## 2021-01-20 MED ORDER — CHLORHEXIDINE GLUCONATE CLOTH 2 % EX PADS: 6.0000 | MEDICATED_PAD | Freq: Once | CUTANEOUS | Status: DC

## 2021-01-20 MED ORDER — LIDOCAINE-EPINEPHRINE 1 %-1:100000 IJ SOLN
INTRAMUSCULAR | Status: DC | PRN
  Administered 2021-01-20: 5 mL

## 2021-01-20 MED ORDER — SODIUM CHLORIDE 0.9 % IV SOLN
INTRAVENOUS | Status: DC | PRN
  Administered 2021-01-20: 500 mL

## 2021-01-20 MED ORDER — MIDAZOLAM HCL 2 MG/2ML IJ SOLN
INTRAMUSCULAR | Status: AC
  Filled 2021-01-20: qty 2

## 2021-01-20 MED ORDER — DEXAMETHASONE SODIUM PHOSPHATE 4 MG/ML IJ SOLN
INTRAMUSCULAR | Status: DC | PRN
  Administered 2021-01-20: 10 mg via INTRAVENOUS

## 2021-01-20 MED ORDER — DEXAMETHASONE SODIUM PHOSPHATE 10 MG/ML IJ SOLN
INTRAMUSCULAR | Status: AC
  Filled 2021-01-20: qty 1

## 2021-01-20 MED ORDER — SODIUM CHLORIDE 0.9% FLUSH: 3.0000 mL | INTRAVENOUS | Status: DC | PRN

## 2021-01-20 MED ORDER — ONDANSETRON HCL 4 MG/2ML IJ SOLN
INTRAMUSCULAR | Status: AC
  Filled 2021-01-20: qty 2

## 2021-01-20 MED ORDER — ACETAMINOPHEN 325 MG PO TABS: 650.0000 mg | ORAL_TABLET | ORAL | Status: DC | PRN

## 2021-01-20 MED ORDER — ACETAMINOPHEN 10 MG/ML IV SOLN: 1000.0000 mg | Freq: Once | INTRAVENOUS | Status: DC | PRN

## 2021-01-20 MED ORDER — MIDAZOLAM HCL 5 MG/5ML IJ SOLN
INTRAMUSCULAR | Status: DC | PRN
  Administered 2021-01-20: 2 mg via INTRAVENOUS

## 2021-01-20 MED ORDER — CEFAZOLIN SODIUM-DEXTROSE 2-4 GM/100ML-% IV SOLN
2.0000 g | INTRAVENOUS | Status: AC
  Administered 2021-01-20: 2 g via INTRAVENOUS

## 2021-01-20 MED ORDER — OXYCODONE HCL 5 MG PO TABS: 5.0000 mg | ORAL_TABLET | Freq: Once | ORAL | Status: DC | PRN

## 2021-01-20 MED ORDER — SUCCINYLCHOLINE CHLORIDE 200 MG/10ML IV SOSY
PREFILLED_SYRINGE | INTRAVENOUS | Status: AC
  Filled 2021-01-20: qty 10

## 2021-01-20 MED ORDER — FENTANYL CITRATE (PF) 100 MCG/2ML IJ SOLN
INTRAMUSCULAR | Status: AC
  Filled 2021-01-20: qty 2

## 2021-01-20 MED ORDER — SODIUM CHLORIDE 0.9 % IV SOLN
INTRAVENOUS | Status: AC
  Filled 2021-01-20: qty 10

## 2021-01-20 MED ORDER — AMISULPRIDE (ANTIEMETIC) 5 MG/2ML IV SOLN: 10.0000 mg | Freq: Once | INTRAVENOUS | Status: DC | PRN

## 2021-01-20 MED ORDER — EPHEDRINE 5 MG/ML INJ
INTRAVENOUS | Status: AC
  Filled 2021-01-20: qty 10

## 2021-01-20 MED ORDER — OXYCODONE HCL 5 MG/5ML PO SOLN: 5.0000 mg | Freq: Once | ORAL | Status: DC | PRN

## 2021-01-20 MED ORDER — ACETAMINOPHEN 325 MG RE SUPP: 650.0000 mg | RECTAL | Status: DC | PRN

## 2021-01-20 MED ORDER — ONDANSETRON HCL 4 MG/2ML IJ SOLN
INTRAMUSCULAR | Status: DC | PRN
  Administered 2021-01-20: 4 mg via INTRAVENOUS

## 2021-01-20 MED ORDER — OXYCODONE HCL 5 MG PO TABS: 5.0000 mg | ORAL_TABLET | ORAL | Status: DC | PRN

## 2021-01-20 MED ORDER — PROPOFOL 10 MG/ML IV BOLUS
INTRAVENOUS | Status: DC | PRN
  Administered 2021-01-20: 200 mg via INTRAVENOUS

## 2021-01-20 MED ORDER — PHENYLEPHRINE 40 MCG/ML (10ML) SYRINGE FOR IV PUSH (FOR BLOOD PRESSURE SUPPORT)
PREFILLED_SYRINGE | INTRAVENOUS | Status: AC
  Filled 2021-01-20: qty 10

## 2021-01-20 MED ORDER — PROMETHAZINE HCL 25 MG/ML IJ SOLN: 6.2500 mg | INTRAMUSCULAR | Status: DC | PRN

## 2021-01-20 MED ORDER — FENTANYL CITRATE (PF) 100 MCG/2ML IJ SOLN
INTRAMUSCULAR | Status: DC | PRN
  Administered 2021-01-20: 100 ug via INTRAVENOUS

## 2021-01-20 MED ORDER — LACTATED RINGERS IV SOLN: INTRAVENOUS | Status: DC

## 2021-01-20 MED ORDER — SODIUM CHLORIDE 0.9 % IV SOLN: 250.0000 mL | INTRAVENOUS | Status: DC | PRN

## 2021-01-20 MED ORDER — SODIUM CHLORIDE 0.9% FLUSH: 3.0000 mL | Freq: Two times a day (BID) | INTRAVENOUS | Status: DC

## 2021-01-20 MED ORDER — DOXYCYCLINE HYCLATE 100 MG PO TABS: 100.0000 mg | ORAL_TABLET | Freq: Two times a day (BID) | ORAL | 0 refills | Status: AC

## 2021-01-20 MED ORDER — CEFAZOLIN SODIUM-DEXTROSE 2-4 GM/100ML-% IV SOLN
INTRAVENOUS | Status: AC
  Filled 2021-01-20: qty 100

## 2021-01-20 MED ORDER — FENTANYL CITRATE (PF) 100 MCG/2ML IJ SOLN: 25.0000 ug | INTRAMUSCULAR | Status: DC | PRN

## 2021-01-20 MED ORDER — LIDOCAINE 2% (20 MG/ML) 5 ML SYRINGE
INTRAMUSCULAR | Status: AC
  Filled 2021-01-20: qty 5

## 2021-01-20 MED ORDER — DROPERIDOL 2.5 MG/ML IJ SOLN
INTRAMUSCULAR | Status: DC | PRN
  Administered 2021-01-20: .625 mg via INTRAVENOUS

## 2021-01-20 MED ORDER — LIDOCAINE HCL (CARDIAC) PF 100 MG/5ML IV SOSY
PREFILLED_SYRINGE | INTRAVENOUS | Status: DC | PRN
  Administered 2021-01-20: 80 mg via INTRAVENOUS

## 2021-01-20 SURGICAL SUPPLY — 102 items
ADH SKN CLS APL DERMABOND .7 (GAUZE/BANDAGES/DRESSINGS)
APL SKNCLS STERI-STRIP NONHPOA (GAUZE/BANDAGES/DRESSINGS)
BAG DECANTER FOR FLEXI CONT (MISCELLANEOUS) ×2 IMPLANT
BENZOIN TINCTURE PRP APPL 2/3 (GAUZE/BANDAGES/DRESSINGS) IMPLANT
BINDER BREAST 3XL (GAUZE/BANDAGES/DRESSINGS) IMPLANT
BINDER BREAST LRG (GAUZE/BANDAGES/DRESSINGS) IMPLANT
BINDER BREAST MEDIUM (GAUZE/BANDAGES/DRESSINGS) IMPLANT
BINDER BREAST XLRG (GAUZE/BANDAGES/DRESSINGS) IMPLANT
BINDER BREAST XXLRG (GAUZE/BANDAGES/DRESSINGS) IMPLANT
BLADE CLIPPER SURG (BLADE) IMPLANT
BLADE HEX COATED 2.75 (ELECTRODE) IMPLANT
BLADE MINI RND TIP GREEN BEAV (BLADE) IMPLANT
BLADE SURG 10 STRL SS (BLADE) ×2 IMPLANT
BLADE SURG 15 STRL LF DISP TIS (BLADE) IMPLANT
BLADE SURG 15 STRL SS (BLADE)
BNDG COHESIVE 4X5 TAN STRL (GAUZE/BANDAGES/DRESSINGS) IMPLANT
BNDG ELASTIC 3X5.8 VLCR STR LF (GAUZE/BANDAGES/DRESSINGS) IMPLANT
BNDG ELASTIC 4X5.8 VLCR STR LF (GAUZE/BANDAGES/DRESSINGS) IMPLANT
BNDG ELASTIC 6X5.8 VLCR STR LF (GAUZE/BANDAGES/DRESSINGS) IMPLANT
BNDG GAUZE ELAST 4 BULKY (GAUZE/BANDAGES/DRESSINGS) IMPLANT
CANISTER SUCT 1200ML W/VALVE (MISCELLANEOUS) ×2 IMPLANT
CORD BIPOLAR FORCEPS 12FT (ELECTRODE) IMPLANT
COVER BACK TABLE 60X90IN (DRAPES) ×2 IMPLANT
COVER MAYO STAND STRL (DRAPES) ×2 IMPLANT
COVER WAND RF STERILE (DRAPES) IMPLANT
DECANTER SPIKE VIAL GLASS SM (MISCELLANEOUS) IMPLANT
DERMABOND ADVANCED (GAUZE/BANDAGES/DRESSINGS)
DERMABOND ADVANCED .7 DNX12 (GAUZE/BANDAGES/DRESSINGS) IMPLANT
DRAIN CHANNEL 19F RND (DRAIN) IMPLANT
DRAIN PENROSE 1/2X12 LTX STRL (WOUND CARE) IMPLANT
DRAPE INCISE IOBAN 66X45 STRL (DRAPES) ×2 IMPLANT
DRAPE LAPAROSCOPIC ABDOMINAL (DRAPES) ×2 IMPLANT
DRAPE LAPAROTOMY 100X72 PEDS (DRAPES) IMPLANT
DRAPE SURG 17X23 STRL (DRAPES) IMPLANT
DRAPE U-SHAPE 76X120 STRL (DRAPES) IMPLANT
DRSG ADAPTIC 3X8 NADH LF (GAUZE/BANDAGES/DRESSINGS) IMPLANT
DRSG EMULSION OIL 3X3 NADH (GAUZE/BANDAGES/DRESSINGS) IMPLANT
DRSG HYDROCOLLOID 4X4 (GAUZE/BANDAGES/DRESSINGS) IMPLANT
DRSG PAD ABDOMINAL 8X10 ST (GAUZE/BANDAGES/DRESSINGS) IMPLANT
DRSG TEGADERM 4X10 (GAUZE/BANDAGES/DRESSINGS) IMPLANT
DRSG TELFA 3X8 NADH (GAUZE/BANDAGES/DRESSINGS) IMPLANT
ELECT BLADE 4.0 EZ CLEAN MEGAD (MISCELLANEOUS) ×2
ELECT NEEDLE TIP 2.8 STRL (NEEDLE) IMPLANT
ELECT REM PT RETURN 9FT ADLT (ELECTROSURGICAL) ×2
ELECTRODE BLDE 4.0 EZ CLN MEGD (MISCELLANEOUS) ×1 IMPLANT
ELECTRODE REM PT RTRN 9FT ADLT (ELECTROSURGICAL) ×1 IMPLANT
EVACUATOR SILICONE 100CC (DRAIN) IMPLANT
GAUZE SPONGE 4X4 12PLY STRL (GAUZE/BANDAGES/DRESSINGS) IMPLANT
GAUZE SPONGE 4X4 12PLY STRL LF (GAUZE/BANDAGES/DRESSINGS) IMPLANT
GAUZE XEROFORM 5X9 LF (GAUZE/BANDAGES/DRESSINGS) IMPLANT
GLOVE SURG ENC MOIS LTX SZ6.5 (GLOVE) ×2 IMPLANT
GLOVE SURG UNDER POLY LF SZ6.5 (GLOVE) ×2 IMPLANT
GOWN STRL REUS W/ TWL LRG LVL3 (GOWN DISPOSABLE) ×2 IMPLANT
GOWN STRL REUS W/ TWL XL LVL3 (GOWN DISPOSABLE) ×1 IMPLANT
GOWN STRL REUS W/TWL LRG LVL3 (GOWN DISPOSABLE) ×4
GOWN STRL REUS W/TWL XL LVL3 (GOWN DISPOSABLE) ×2
KIT PREVENA INCISION MGT 13 (CANNISTER) ×2 IMPLANT
NEEDLE HYPO 25X1 1.5 SAFETY (NEEDLE) ×2 IMPLANT
NEEDLE PRECISIONGLIDE 27X1.5 (NEEDLE) IMPLANT
NS IRRIG 1000ML POUR BTL (IV SOLUTION) ×2 IMPLANT
PACK BASIN DAY SURGERY FS (CUSTOM PROCEDURE TRAY) ×2 IMPLANT
PADDING CAST ABS 3INX4YD NS (CAST SUPPLIES)
PADDING CAST ABS 4INX4YD NS (CAST SUPPLIES)
PADDING CAST ABS COTTON 3X4 (CAST SUPPLIES) IMPLANT
PADDING CAST ABS COTTON 4X4 ST (CAST SUPPLIES) IMPLANT
PENCIL SMOKE EVACUATOR (MISCELLANEOUS) ×2 IMPLANT
SHEET MEDIUM DRAPE 40X70 STRL (DRAPES) IMPLANT
SHEETING SILICONE GEL EPI DERM (MISCELLANEOUS) ×2 IMPLANT
SLEEVE SCD COMPRESS KNEE MED (STOCKING) ×2 IMPLANT
SPLINT PLASTER CAST XFAST 3X15 (CAST SUPPLIES) IMPLANT
SPLINT PLASTER XTRA FASTSET 3X (CAST SUPPLIES)
SPONGE LAP 18X18 RF (DISPOSABLE) ×6 IMPLANT
STAPLER VISISTAT 35W (STAPLE) IMPLANT
STOCKINETTE 4X48 STRL (DRAPES) IMPLANT
STOCKINETTE 6  STRL (DRAPES)
STOCKINETTE 6 STRL (DRAPES) IMPLANT
STOCKINETTE IMPERVIOUS LG (DRAPES) IMPLANT
SUCTION FRAZIER HANDLE 10FR (MISCELLANEOUS)
SUCTION TUBE FRAZIER 10FR DISP (MISCELLANEOUS) IMPLANT
SURGILUBE 2OZ TUBE FLIPTOP (MISCELLANEOUS) IMPLANT
SUT MNCRL AB 3-0 PS2 18 (SUTURE) ×2 IMPLANT
SUT MNCRL AB 4-0 PS2 18 (SUTURE) ×2 IMPLANT
SUT MON AB 3-0 SH 27 (SUTURE) ×2
SUT MON AB 3-0 SH27 (SUTURE) ×1 IMPLANT
SUT MON AB 5-0 PS2 18 (SUTURE) IMPLANT
SUT PDS 3-0 CT2 (SUTURE) ×2
SUT PDS AB 2-0 CT2 27 (SUTURE) ×4 IMPLANT
SUT PDS II 3-0 CT2 27 ABS (SUTURE) ×1 IMPLANT
SUT SILK 3 0 PS 1 (SUTURE) IMPLANT
SUT VIC AB 3-0 FS2 27 (SUTURE) IMPLANT
SUT VIC AB 5-0 P-3 18X BRD (SUTURE) IMPLANT
SUT VIC AB 5-0 P3 18 (SUTURE)
SUT VIC AB 5-0 PS2 18 (SUTURE) IMPLANT
SWAB COLLECTION DEVICE MRSA (MISCELLANEOUS) IMPLANT
SWAB CULTURE ESWAB REG 1ML (MISCELLANEOUS) IMPLANT
SYR BULB IRRIG 60ML STRL (SYRINGE) ×2 IMPLANT
SYR CONTROL 10ML LL (SYRINGE) ×2 IMPLANT
TOWEL GREEN STERILE FF (TOWEL DISPOSABLE) ×4 IMPLANT
TRAY DSU PREP LF (CUSTOM PROCEDURE TRAY) ×2 IMPLANT
TUBE CONNECTING 20X1/4 (TUBING) ×2 IMPLANT
UNDERPAD 30X36 HEAVY ABSORB (UNDERPADS AND DIAPERS) IMPLANT
YANKAUER SUCT BULB TIP NO VENT (SUCTIONS) ×2 IMPLANT

## 2021-01-20 NOTE — Anesthesia Preprocedure Evaluation (Addendum)
Anesthesia Evaluation  Patient identified by MRN, date of birth, ID band Patient awake    Reviewed: Allergy & Precautions, NPO status , Patient's Chart, lab work & pertinent test results  Airway Mallampati: III  TM Distance: >3 FB Neck ROM: Full    Dental no notable dental hx.    Pulmonary asthma , former smoker,    Pulmonary exam normal breath sounds clear to auscultation       Cardiovascular hypertension, Pt. on medications Normal cardiovascular exam Rhythm:Regular Rate:Normal  ECG: NSR, rate 71  CATH: Assessment: 1. Normal coronary arteries 2. LVEF 60-65% 3. Mild PAH likely due to OSA/OHS 4. Otherwise normal hemodynamics 4. Severe systemic HTN   Neuro/Psych PSYCHIATRIC DISORDERS Anxiety Depression negative neurological ROS     GI/Hepatic Neg liver ROS, GERD  Medicated and Controlled,  Endo/Other  diabetes, Oral Hypoglycemic AgentsHypothyroidism Hyperthyroidism   Renal/GU CRFRenal disease     Musculoskeletal Gout   Abdominal (+) + obese,   Peds  Hematology  (+) Blood dyscrasia, anemia , HIV, HLD   Anesthesia Other Findings post panniculectomy  Reproductive/Obstetrics                            Anesthesia Physical Anesthesia Plan  ASA: III  Anesthesia Plan: General   Post-op Pain Management:    Induction: Intravenous  PONV Risk Score and Plan: 3 and Ondansetron, Dexamethasone, Midazolam and Treatment may vary due to age or medical condition  Airway Management Planned: LMA  Additional Equipment:   Intra-op Plan:   Post-operative Plan: Extubation in OR  Informed Consent: I have reviewed the patients History and Physical, chart, labs and discussed the procedure including the risks, benefits and alternatives for the proposed anesthesia with the patient or authorized representative who has indicated his/her understanding and acceptance.     Dental advisory given  Plan  Discussed with: CRNA  Anesthesia Plan Comments:        Anesthesia Quick Evaluation

## 2021-01-20 NOTE — Anesthesia Postprocedure Evaluation (Signed)
Anesthesia Post Note  Patient: [name]  Procedure(s) Performed: Excision of abdominal wound with closure (N/A Abdomen) APPLICATION OF WOUND VAC (N/A Abdomen)     Patient location during evaluation: PACU Anesthesia Type: General Level of consciousness: awake Pain management: pain level controlled Vital Signs Assessment: post-procedure vital signs reviewed and stable Respiratory status: spontaneous breathing, nonlabored ventilation, respiratory function stable and patient connected to nasal cannula oxygen Cardiovascular status: blood pressure returned to baseline and stable Postop Assessment: no apparent nausea or vomiting Anesthetic complications: no   No complications documented.  Last Vitals:  Vitals:   01/20/21 1130 01/20/21 1207  BP: (!) 156/87 135/78  Pulse: 70 66  Resp: 10 14  Temp:  36.8 C  SpO2: 97% 98%    Last Pain:  Vitals:   01/20/21 1150  TempSrc:   PainSc: 0-No pain                 Kaydynce Pat P Saketh Daubert

## 2021-01-20 NOTE — Discharge Instructions (Addendum)
INSTRUCTIONS FOR AFTER SURGERY   You will likely have some questions about what to expect following your operation.  The following information will help you and your family understand what to expect when you are discharged from the hospital.  Following these guidelines will help ensure a smooth recovery and reduce risks of complications.  Postoperative instructions include information on: diet, wound care, medications and physical activity.  AFTER SURGERY Expect to go home after the procedure.  In some cases, you may need to spend one night in the hospital for observation.  DIET This surgery does not require a specific diet.  However, I have to mention that the healthier you eat the better your body can start healing. It is important to increasing your protein intake.  This means limiting the foods with added sugar.  Focus on fruits and vegetables and some meat. It is very important to drink water after your surgery.  If your urine is bright yellow, then it is concentrated, and you need to drink more water.  As a general rule after surgery, you should have 8 ounces of water every hour while awake.  If you find you are persistently nauseated or unable to take in liquids let us know.  NO TOBACCO USE or EXPOSURE.  This will slow your healing process and increase the risk of a wound.  WOUND CARE Keep dressing in place.  Call office when you have the home VAC and we will explain how to connect it to you dressing. We close your incision to leave the smallest and best-looking scar. No ointment or creams on your incisions until given the go ahead.  Especially not Neosporin (Too many skin reactions with this one).  A few weeks after surgery you can use Mederma and start massaging the scar. We ask you to wear your binder or sports bra for the first 6 weeks around the clock, including while sleeping. This provides added comfort and helps reduce the fluid accumulation at the surgery site.  ACTIVITY No heavy  lifting until cleared by the doctor.  It is OK to walk and climb stairs. In fact, moving your legs is very important to decrease your risk of a blood clot.  It will also help keep you from getting deconditioned.  Every 1 to 2 hours get up and walk for 5 minutes. This will help with a quicker recovery back to normal.  Let pain be your guide so you don't do too much.  NO, you cannot do the spring cleaning and don't plan on taking care of anyone else.  This is your time for TLC.   WORK Everyone returns to work at different times. As a rough guide, most people take at least 1 - 2 weeks off prior to returning to work. If you need documentation for your job, bring the forms to your postoperative follow up visit.  DRIVING Arrange for someone to bring you home from the hospital.  You may be able to drive a few days after surgery but not while taking any narcotics or valium.  BOWEL MOVEMENTS Constipation can occur after anesthesia and while taking pain medication.  It is important to stay ahead for your comfort.  We recommend taking Milk of Magnesia (2 tablespoons; twice a day) while taking the pain pills.  SEROMA This is fluid your body tried to put in the surgical site.  This is normal but if it creates excessive pain and swelling let us know.  It usually decreases in a few  weeks.  MEDICATIONS and PAIN CONTROL At your preoperative visit for you history and physical you were given the following medications: 1. An antibiotic: Start this medication when you get home and take according to the instructions on the bottle. 2. Zofran 4 mg:  This is to treat nausea and vomiting.  You can take this every 6 hours as needed and only if needed. 3. Norco (hydrocodone/acetaminophen) 5/325 mg:  This is only to be used after you have taken the motrin or the tylenol. Every 8 hours as needed. Over the counter Medication to take: 4. Ibuprofen (Motrin) 600 mg:  Take this every 6 hours.  If you have additional pain then take  500 mg of the tylenol.  Only take the Norco after you have tried these two. 5. Miralax or stool softener of choice: Take this according to the bottle if you take the [place] Call your surgeon's office if any of the following occur: . Fever 101 degrees F or greater . Excessive bleeding or fluid from the incision site. . Pain that increases over time without aid from the medications . Redness, warmth, or pus draining from incision sites . Persistent nausea or inability to take in liquids . Severe misshapen area that underwent the operation.   Post Anesthesia Home Care Instructions  Activity: Get plenty of rest for the remainder of the day. A responsible individual must stay with you for 24 hours following the procedure.  For the next 24 hours, DO NOT: -Drive a car -Paediatric nurse -Drink alcoholic beverages -Take any medication unless instructed by your physician -Make any legal decisions or sign important papers.  Meals: Start with liquid foods such as gelatin or soup. Progress to regular foods as tolerated. Avoid greasy, spicy, heavy foods. If nausea and/or vomiting occur, drink only clear liquids until the nausea and/or vomiting subsides. Call your physician if vomiting continues.  Special Instructions/Symptoms: Your throat may feel dry or sore from the anesthesia or the breathing tube placed in your throat during surgery. If this causes discomfort, gargle with warm salt water. The discomfort should disappear within 24 hours.  If you had a scopolamine patch placed behind your ear for the management of post- operative nausea and/or vomiting:  1. The medication in the patch is effective for 72 hours, after which it should be removed.  Wrap patch in a tissue and discard in the trash. Wash hands thoroughly with soap and water. 2. You may remove the patch earlier than 72 hours if you experience unpleasant side effects which may include dry mouth, dizziness or visual  disturbances. 3. Avoid touching the patch. Wash your hands with soap and water after contact with the patch.

## 2021-01-20 NOTE — Op Note (Signed)
DATE OF OPERATION: 01/20/2021  LOCATION: [facility]  PREOPERATIVE DIAGNOSIS: abdominal wound  POSTOPERATIVE DIAGNOSIS: Same  PROCEDURE: Excision of abdominal wound 5 x 16 cm  SURGEON: Zeda Gangwer Sanger Sherrin Stahle, DO  ASSISTANT: Rodman Key Scheeler,  EBL: 5 cc  CONDITION: Stable  COMPLICATIONS: None  INDICATION: The patient, [name], is a 55 y.o. female born on 22-Oct-1965, is here for treatment of an abdominal wound after a panniculectomy. She has a long history of skin infections and breakdown.     PROCEDURE DETAILS:  The patient was seen prior to surgery and marked.  The IV antibiotics were given. The patient was taken to the operating room and given a general anesthetic. A standard time out was performed and all information was confirmed by those in the room. SCDs were placed.   The abdomen was prepped and draped.  The #10 blade and bovie were used to excise the 5 x 16 cm wound of nonviable skin and soft tissue.  There was a lot of fat necrosis.  There did not seem to be infection.  Hemostasis was achieved with electrocautery.  The wound was irrigated with antibiotic solution and saline.  Undermining was done for 1 cm in the superior and inferior direction for decreased tension. The deep layer was closed with the 2-0 PDS.  The skin was very loosely approximated with the 3-0 Monocryl.   A prevena was applied and had an excellent seal. The patient was allowed to wake up and taken to recovery room in stable condition at the end of the case. The family was notified at the end of the case.   The advanced practice practitioner (APP) assisted throughout the case.  The APP was essential in retraction and counter traction when needed to make the case progress smoothly.  This retraction and assistance made it possible to see the tissue plans for the procedure.  The assistance was needed for blood control, tissue re-approximation and assisted with closure of the incision site.

## 2021-01-20 NOTE — Transfer of Care (Signed)
Immediate Anesthesia Transfer of Care Note  Patient: [name]  Procedure(s) Performed: Excision of abdominal wound with closure (N/A Abdomen) APPLICATION OF WOUND VAC (N/A Abdomen)  Patient Location: PACU  Anesthesia Type:General  Level of Consciousness: awake, drowsy and patient cooperative  Airway & Oxygen Therapy: Patient Spontanous Breathing and Patient connected to face mask oxygen  Post-op Assessment: Report given to RN and Post -op Vital signs reviewed and stable  Post vital signs: Reviewed and stable  Last Vitals:  Vitals Value Taken Time  BP    Temp    Pulse    Resp    SpO2      Last Pain:  Vitals:   01/20/21 1002  TempSrc: Oral  PainSc: 0-No pain         Complications: No complications documented.

## 2021-01-20 NOTE — Anesthesia Procedure Notes (Signed)
Procedure Name: LMA Insertion Date/Time: 01/20/2021 10:20 AM Performed by: Willa Frater, CRNA Pre-anesthesia Checklist: Patient identified, Emergency Drugs available, Suction available and Patient being monitored Patient Re-evaluated:Patient Re-evaluated prior to induction Oxygen Delivery Method: Circle system utilized Preoxygenation: Pre-oxygenation with 100% oxygen Induction Type: IV induction Ventilation: Mask ventilation without difficulty LMA: LMA inserted LMA Size: 4.0 Number of attempts: 1 Airway Equipment and Method: Bite block Placement Confirmation: positive ETCO2 Tube secured with: Tape Dental Injury: Teeth and Oropharynx as per pre-operative assessment

## 2021-01-21 ENCOUNTER — Telehealth (HOSPITAL_COMMUNITY): Payer: Self-pay

## 2021-01-21 ENCOUNTER — Encounter (HOSPITAL_BASED_OUTPATIENT_CLINIC_OR_DEPARTMENT_OTHER): Payer: Self-pay | Admitting: Plastic Surgery

## 2021-01-21 NOTE — Telephone Encounter (Signed)
Received a fax requesting medical records from [place]. Records were successfully faxed to: [phone] ,which was the number provided.. Medical request form will be scanned into patients chart.

## 2021-01-22 NOTE — Telephone Encounter (Signed)
Completed and placed in Joellen's inbox. 

## 2021-01-22 NOTE — Telephone Encounter (Signed)
Left message to return call to our office.  Need to know how she wants to get it mailed or picked up at our office

## 2021-01-22 NOTE — H&P (Signed)
[name] is an 55 y.o. female.   Chief Complaint: Abdominal wound HPI: The patient is a 55 yrs old female here for an abdominal wound.  She underwent a large panniculectomy.  She developed a wound.  She has multiple medical co-morbidities.  She is stable. Options given for local wound care and excision.  Patient wishes to proceed with excision.  Past Medical History:  Diagnosis Date  . Allergy   . Anemia    Normocytic  . Anxiety   . Asthma   . Blood dyscrasia   . Bronchitis 2005  . CKD (chronic kidney disease)   . 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 Diabetes mellitus without complication ([place])   . Family history of breast cancer   . Family history of lung cancer   . Family history of prostate cancer   . Gastroenteritis 07/10/07  . GERD 07/24/2006  . GRAVE'S DISEASE 01/01/2008  . History of hidradenitis suppurativa   . History of kidney stones   . History of thrush   . HIV DISEASE 07/24/2006   dx March 05  . Hyperlipidemia   . HYPERTENSION 07/24/2006  . Hyperthyroidism 08/2006   Grave's Disease -diffuse radiotracer uptake 08/25/06 Thyroid scan-Cold nodule to R lower lobe of thyrorid  . Menometrorrhagia    hx of  . Nephrolithiasis   . 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  . Personal history of chemotherapy    2020  . Personal history of radiation therapy    2020  . Pneumonia 2005  . 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 Sarcoidosis 02/08/2007   dx as a teenager in [place] from abnl CXR. Completed 2 yrs Prednisone after lung bx confirmation. No symptoms since then.  . Suppurative hidradenitis   . Thyroid cancer ([place])   . THYROID NODULE, RIGHT 02/08/2007    Past Surgical History:  Procedure Laterality Date  . APPLICATION OF WOUND VAC N/A 01/20/2021   Procedure:  APPLICATION OF WOUND VAC;  Surgeon: Wallace Going, DO;  Location: [facility];  Service: Plastics;  Laterality: N/A;  . BREAST EXCISIONAL BIOPSY Right 04/26/2018   right axilla negative  . BREAST EXCISIONAL BIOPSY Left 04/26/2018   left axilla negative  . BREAST LUMPECTOMY Right 10/03/2018   malignant  . BREAST LUMPECTOMY WITH RADIOACTIVE SEED AND SENTINEL LYMPH NODE BIOPSY Right 10/03/2018   Procedure: RIGHT BREAST LUMPECTOMY WITH RADIOACTIVE SEED AND SENTINEL LYMPH NODE MAPPING;  Surgeon: Erroll Luna, MD;  Location: [facility];  Service: General;  Laterality: Right;  . BREAST SURGERY  1997   Breast Reduction   . CYSTOSCOPY W/ URETERAL STENT REMOVAL  11/09/2012   Procedure: CYSTOSCOPY WITH STENT REMOVAL;  Surgeon: Alexis Frock, MD;  Location: [facility];  Service: Urology;  Laterality: Right;  . CYSTOSCOPY WITH RETROGRADE PYELOGRAM, URETEROSCOPY AND STENT PLACEMENT  11/09/2012   Procedure: CYSTOSCOPY WITH RETROGRADE PYELOGRAM, URETEROSCOPY AND STENT PLACEMENT;  Surgeon: Alexis Frock, MD;  Location: [facility];  Service: Urology;  Laterality: Left;  LEFT URETEROSCOPY, STONE MANIPULATION, left STENT exchange   . CYSTOSCOPY WITH STENT PLACEMENT  10/02/2012   Procedure: CYSTOSCOPY WITH STENT PLACEMENT;  Surgeon: Alexis Frock, MD;  Location: [facility];  Service: Urology;  Laterality: Left;  . DEBRIDEMENT AND CLOSURE WOUND N/A 01/20/2021   Procedure: Excision of abdominal wound with closure;  Surgeon: Wallace Going, DO;  Location: [facility];  Service: Plastics;  Laterality: N/A;  . DILATION AND CURETTAGE OF UTERUS  Feb 2004   s/p for 1st trimester nonviable pregnancy  . EYE SURGERY     sty under eyelid  . INCISE AND DRAIN ABCESS  Nov 03   s/p I &D for righ inframmary fold hidradenitis  . INCISION AND DRAINAGE PERITONSILLAR ABSCESS  Mar 03  . IR CV LINE INJECTION  06/07/2018  . IR IMAGING GUIDED PORT INSERTION  06/20/2018  . IR REMOVAL TUN ACCESS W/ PORT W/O FL  MOD SED  06/20/2018  . IRRIGATION AND DEBRIDEMENT ABSCESS  01/31/2012   Procedure: IRRIGATION AND DEBRIDEMENT ABSCESS;  Surgeon: Shann Medal, MD;  Location: [facility];  Service: General;  Laterality: Right;  right breast and axilla   . NEPHROLITHOTOMY  10/02/2012   Procedure: NEPHROLITHOTOMY PERCUTANEOUS;  Surgeon: Alexis Frock, MD;  Location: [facility];  Service: Urology;  Laterality: Right;  First Stage Percutaneous Nephrolithotomy with Surgeon Access, Left Ureteral Stent    . NEPHROLITHOTOMY  10/04/2012   Procedure: NEPHROLITHOTOMY PERCUTANEOUS SECOND LOOK;  Surgeon: Alexis Frock, MD;  Location[facility];  Service: Urology;  Laterality: Right;     . NEPHROLITHOTOMY  10/08/2012   Procedure: NEPHROLITHOTOMY PERCUTANEOUS;  Surgeon: Alexis Frock, MD;  Location[facility];  Service: Urology;  Laterality: Right;  THIRD STAGE, nephrostomy tube exchange x 2  . NEPHROLITHOTOMY  10/11/2012   Procedure: NEPHROLITHOTOMY PERCUTANEOUS SECOND LOOK;  Surgeon: Alexis Frock, MD;  Location[facility];  Service: Urology;  Laterality: Right;  RIGHT 4 STAGE PERCUTANOUS NEPHROLITHOTOMY, right URETEROSCOPY WITH HOLMIUM LASER   . PANNICULECTOMY N/A 12/21/2020   Procedure: PANNICULECTOMY;  Surgeon: Wallace Going, DO;  Location: [facility];  Service: Plastics;  Laterality: N/A;  . PORT-A-CATH REMOVAL N/A 07/16/2020   Procedure: REMOVAL PORT-A-CATH;  Surgeon: Erroll Luna, MD;  Location: [facility];  Service: General;  Laterality: N/A;  . PORTACATH PLACEMENT Left 05/17/2018   Procedure: INSERTION PORT-A-CATH;  Surgeon: Coralie Keens, MD;  Location: [facility];  Service: General;  Laterality: Left;  . RADICAL NECK DISSECTION  04/12/2017   limited/notes 04/12/2017  . RADICAL NECK DISSECTION N/A 04/12/2017   Procedure: RADICAL NECK DISSECTION;  Surgeon: Melida Quitter, MD;  Location: [facility];  Service: ENT;  Laterality: N/A;  limited neck dissection 2 hours total  . REDUCTION MAMMAPLASTY Bilateral  1998  . RIGHT/LEFT HEART CATH AND CORONARY ANGIOGRAPHY N/A 03/12/2020   Procedure: RIGHT/LEFT HEART CATH AND CORONARY ANGIOGRAPHY;  Surgeon: Jolaine Artist, MD;  Location: [facility];  Service: Cardiovascular;  Laterality: N/A;  . [place]  1994  . THYROIDECTOMY  04/12/2017   completion/notes 04/12/2017  . THYROIDECTOMY N/A 04/12/2017   Procedure: THYROIDECTOMY;  Surgeon: Melida Quitter, MD;  Location: [facility];  Service: ENT;  Laterality: N/A;  Completion Thyroidectomy  . TOTAL THYROIDECTOMY  2010    Family History  Problem Relation Age of Onset  . Hypertension Mother   . Cancer Mother        laryngeal  . Heart disease Mother        stent  . Hypertension Father   . Lung cancer Father 78       hx smoking  . Heart disease Other   . Hypertension Other   . Stroke Other        Grandparent  . Kidney disease Other        Grandparent  . Diabetes Other  FH of Diabetes  . Hypertension Sister   . Cancer Maternal Uncle        Lung CA  . Hypertension Brother   . Hypertension Sister   . Breast cancer Maternal Aunt 65  . Breast cancer Paternal Aunt 23  . Prostate cancer Paternal Uncle   . Breast cancer Maternal Aunt        dx 60+  . Breast cancer Paternal Aunt        dx 13's  . Breast cancer Paternal Aunt        dx 67's  . Prostate cancer Paternal Uncle   . Lung cancer Paternal Uncle   . Breast cancer Cousin 72  . Breast cancer Cousin        dx <50  . Breast cancer Cousin        dx <50  . Breast cancer Cousin        dx <50  . Colon cancer Neg Hx   . Esophageal cancer Neg Hx   . Rectal cancer Neg Hx   . Stomach cancer Neg Hx    Social History:  reports that she quit smoking about 3 years ago. Her smoking use included cigarettes. She started smoking about 3 years ago. She has a 7.50 pack-year smoking history. She has never used smokeless tobacco. She reports current alcohol use. She reports that she does not use drugs.  Allergies:  Allergies  Allergen Reactions   . Genvoya [Elviteg-Cobic-Emtricit-Tenofaf] Hives  . Lisinopril Cough    No medications prior to admission.    No results found. However, due to the size of the patient record, not all encounters were searched. Please check Results Review for a complete set of results. No results found.  Review of Systems  Constitutional: Positive for activity change. Negative for appetite change.  HENT: Negative.   Eyes: Negative.   Cardiovascular: Negative.   Gastrointestinal: Negative.   Endocrine: Negative.   Genitourinary: Negative.   Musculoskeletal: Negative.   Skin: Positive for color change and wound.  Neurological: Negative.     Blood pressure 135/78, pulse 66, temperature 98.3 F (36.8 C), resp. rate 14, height 5\' 5"  (1.651 m), weight 102.8 kg, last menstrual period 03/31/2014, SpO2 98 %. Physical Exam Vitals and nursing note reviewed.  Constitutional:      Appearance: Normal appearance.  HENT:     Head: Normocephalic and atraumatic.  Cardiovascular:     Rate and Rhythm: Normal rate.     Pulses: Normal pulses.  Pulmonary:     Effort: Pulmonary effort is normal.  Abdominal:    Musculoskeletal:        General: Tenderness present.  Skin:    General: Skin is warm.     Findings: Erythema and lesion present.  Neurological:     Mental Status: She is alert.  Psychiatric:        Mood and Affect: Mood normal.        Behavior: Behavior normal.      Assessment/Plan Abdominal wound = plan excision and attempt closure.  Patient in agreement.  [place], DO 01/22/2021, 5:22 PM

## 2021-01-26 ENCOUNTER — Encounter: Payer: Self-pay | Admitting: Plastic Surgery

## 2021-01-26 ENCOUNTER — Other Ambulatory Visit: Payer: Self-pay

## 2021-01-26 ENCOUNTER — Ambulatory Visit (INDEPENDENT_AMBULATORY_CARE_PROVIDER_SITE_OTHER): Payer: 59 | Admitting: Plastic Surgery

## 2021-01-26 VITALS — BP 149/82 | HR 75

## 2021-01-26 DIAGNOSIS — M793 Panniculitis, unspecified: Secondary | ICD-10-CM

## 2021-01-26 NOTE — Progress Notes (Signed)
The patient is a 55 year old female here for follow-up after undergoing a panniculectomy.  She had some opening and we did an excision of the wound with closure.  She had a Prevena in place.  She switched it to her home VAC.  She brought her supplies with her and we did a VAC change.  A picture was placed in the chart with the patient's consent.  She is looking good and feels much better.  I do not know if it is going to stay close but I think the [facility] is going to help.  We will see her back in 1 week.  She can take the back off on Sunday or Monday and shower.  She can also change it if she feels comfortable.

## 2021-01-28 NOTE — Telephone Encounter (Signed)
Was placed at reception for pick up

## 2021-02-01 ENCOUNTER — Telehealth: Payer: Self-pay | Admitting: Plastic Surgery

## 2021-02-01 ENCOUNTER — Telehealth: Payer: Self-pay | Admitting: *Deleted

## 2021-02-01 NOTE — Telephone Encounter (Signed)
Received [facility] Therapy Insurance Authorization Form on (01/28/2021), requesting section 5C to be completed with updated wound measurements from (01/21/2021) when vac was placed.    Form completed and faxed back to [facility] along with OP notes.  Confirmation received and copy scanned into the chart.//AB/CMA

## 2021-02-01 NOTE — Telephone Encounter (Signed)
Patient left vm on Friday with a question about her incision. I called patient back this morning to follow up to message. She said that her machine was beeping and saying leak detection. Friday evening the drain came out. She has covered the incision over the weekend and has been extremely careful. She said she is a little sore but not in pain and is okay to wait until tomorrow when she comes into clinic. I advised the patient to call us if anything changes today. Patient stated understanding.

## 2021-02-02 ENCOUNTER — Ambulatory Visit (INDEPENDENT_AMBULATORY_CARE_PROVIDER_SITE_OTHER): Payer: 59 | Admitting: Plastic Surgery

## 2021-02-02 ENCOUNTER — Other Ambulatory Visit: Payer: Self-pay

## 2021-02-02 ENCOUNTER — Encounter: Payer: Self-pay | Admitting: Plastic Surgery

## 2021-02-02 VITALS — BP 128/88 | HR 83

## 2021-02-02 DIAGNOSIS — M793 Panniculitis, unspecified: Secondary | ICD-10-CM

## 2021-02-03 NOTE — Progress Notes (Signed)
The patient is a 55 yrs old female here for follow up on her abdominal surgery and wound closure.  She is doing well.  There is a small area of opening on the left aspect of the closed wound/incision.  She may be getting some drainage from the area.  The rest of the incision looks to be healing.  No redness or malodor.  She should continue with the Vaseline or xeroform to the area daily.  Plan for follow up with [place] in 1-2 weeks.

## 2021-02-19 ENCOUNTER — Telehealth: Payer: Self-pay | Admitting: *Deleted

## 2021-02-19 NOTE — Telephone Encounter (Signed)
Received V.A.C. Wound Progress Tracking from [employer] on (02/11/21) via of fax requesting measurements.    Form completed,signed and faxed back to [facility].  Confirmation received and copy scanned into the chart.//AB/CMA

## 2021-02-24 ENCOUNTER — Ambulatory Visit (INDEPENDENT_AMBULATORY_CARE_PROVIDER_SITE_OTHER): Payer: 59 | Admitting: Surgical

## 2021-02-24 ENCOUNTER — Encounter: Payer: Self-pay | Admitting: Surgical

## 2021-02-24 ENCOUNTER — Other Ambulatory Visit: Payer: Self-pay

## 2021-02-24 DIAGNOSIS — M793 Panniculitis, unspecified: Secondary | ICD-10-CM

## 2021-02-24 NOTE — Progress Notes (Signed)
Patient is a 55 year old female here for follow-up on her abdominal surgery and wound closure.  She underwent panniculectomy on 12/21/2020 with Dr. Marla Roe and subsequently underwent excision of abdominal wound with closure and application of wound VAC on 01/20/2021.  Patient reports she is overall doing really well.  She reports some tenderness over her abdomen.  She reports that her appetite has improved and she is not having any fevers, nausea or vomiting.  She does report that she has felt a little bit cold lately. She reports that she has occasionally noticed some small amount of drainage but she is not noticing from where.  Chaperone present on exam On exam abdominal incision is intact.  No open wounds noted.  No foul odor is noted.  No purulent drainage noted.  There is some tenderness palpation of her abdomen.  Umbilicus is viable with no necrosis noted.  Recommend following up on an as-needed basis.  Continue to avoid heavy lifting for 2 more weeks.  Continue to wear compressive garment to assist with swelling.  Recommend calling with questions or concerns.  Pictures were taken and placed in the patient's chart with patient's permission. We remain available as needed.  Discussed with patient she is welcome to come back at anytime with any questions or concerns or call our office.  She is very thankful and very pleased with how things are going.

## 2021-03-08 ENCOUNTER — Encounter: Payer: Self-pay | Admitting: Family Medicine

## 2021-03-08 ENCOUNTER — Telehealth (INDEPENDENT_AMBULATORY_CARE_PROVIDER_SITE_OTHER): Payer: 59 | Admitting: Family Medicine

## 2021-03-08 VITALS — BP 184/76 | Temp 98.5°F | Ht 65.0 in | Wt 208.0 lb

## 2021-03-08 DIAGNOSIS — I10 Essential (primary) hypertension: Secondary | ICD-10-CM | POA: Diagnosis not present

## 2021-03-08 DIAGNOSIS — J069 Acute upper respiratory infection, unspecified: Secondary | ICD-10-CM | POA: Diagnosis not present

## 2021-03-08 DIAGNOSIS — R319 Hematuria, unspecified: Secondary | ICD-10-CM | POA: Diagnosis not present

## 2021-03-08 NOTE — Progress Notes (Signed)
[facility] [address] [place] [address] Dept: [phone] Dept Fax: [phone]  Virtual Video Visit  I connected with [name] on 03/08/21 at  4:00 PM EDT by a video enabled telemedicine application and verified that I am speaking with the correct person using two identifiers.  Location patient: Home Location provider: Clinic Persons participating in the virtual visit: Patient, Provider  I discussed the limitations of evaluation and management by telemedicine and the availability of in person appointments. The patient expressed understanding and agreed to proceed.  Chief Complaint  Patient presents with  . Acute Visit    C/o having chills, body aches, Ha's, diarrhea x 5 days.  Also saw blood in urine today.  Has taken Musinex and [employer], antihistamine.  Contact with family members with covid.  (-) covid test twice.     SUBJECTIVE:  HPI: [name] is a 55 y.o. female who presents with a 5-day history of chills, myalgias, headache, diarrhea, cough, and nasal congestion. She had been exposed to her nieces on ~ May 11. They tested positive for COVID on ~May 14th. Ms. Miles began having significant headache around May 17th, which has been hard to get to resolve. a few days later, she began having chills. She notes she conducted 2 at-home COVID tests and went to [facility] and had a PCR tests. All have been negative.  She is using Mucinex, Airborne, and Tylenol for managing her symptoms. Ms. Milleson also is now noting blood in her urine (urine looks red). She has a prior history of hemorrhagic cystitis and has had prior kidney stones.  Patient Active Problem List   Diagnosis Date Noted  . Voice hoarseness 12/15/2020  . Recurrent falls 11/05/2020  . Antibiotic-induced yeast infection 09/28/2020  . Dysuria 09/28/2020  . Impingement syndrome of left shoulder region 05/27/2020  . Panniculitis 05/12/2020  .  Inclusion cyst 05/01/2020  . Sebaceous cyst 04/21/2020  . Postoperative breast asymmetry 02/28/2020  . Cognitive dysfunction 02/21/2020  . Chronic gout 01/27/2020  . Hematuria 12/10/2019  . Neuropathy due to chemotherapeutic drug ([place]) 12/10/2019  . Bursitis of left shoulder 09/06/2019  . Pain in joint of left shoulder 09/05/2019  . Dysphagia 07/23/2019  . CKD stage 3 due to type 2 diabetes mellitus ([place]) 07/12/2019  . Normocytic anemia 07/12/2019  . Preventative health care 09/10/2018  . Genetic testing 07/25/2018  . Port-A-Cath in place 07/12/2018  . Family history of breast cancer   . Family history of prostate cancer   . Family history of lung cancer   . Malignant neoplasm of lower-inner quadrant of right breast of female, estrogen receptor positive ([place]) 05/10/2018  . Hyperlipidemia 04/26/2017  . GAD (generalized anxiety disorder) 04/11/2017  . Mass of left side of neck 11/24/2016  . Laryngopharyngeal reflux (LPR) 11/24/2016  . Upper airway cough syndrome 11/11/2016  . Morbid (severe) obesity due to excess calories ([place]) 10/14/2016  . Allergic rhinitis 02/18/2016  . Non-insulin dependent type 2 diabetes mellitus ([place]) 06/23/2015  . Renal calculi 10/29/2014  . Recurrent boils 06/11/2014  . Hidradenitis suppurativa of right >> left axilla 01/30/2012  . 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 Sarcoidosis 02/08/2007  . Cigarette smoker 02/08/2007  . HIV disease ([place]) 07/24/2006  . Depression 07/24/2006  . Essential hypertension 07/24/2006  . Asthma 07/24/2006  . GERD 07/24/2006   Past Surgical History:  Procedure Laterality Date  . APPLICATION OF WOUND VAC N/A 01/20/2021   Procedure: APPLICATION OF WOUND  VAC;  Surgeon: Wallace Going, DO;  Location: [place];  Service: Plastics;  Laterality: N/A;  . BREAST EXCISIONAL BIOPSY Right 04/26/2018   right axilla negative  . BREAST EXCISIONAL BIOPSY Left 04/26/2018   left axilla negative  . BREAST  LUMPECTOMY Right 10/03/2018   malignant  . BREAST LUMPECTOMY WITH RADIOACTIVE SEED AND SENTINEL LYMPH NODE BIOPSY Right 10/03/2018   Procedure: RIGHT BREAST LUMPECTOMY WITH RADIOACTIVE SEED AND SENTINEL LYMPH NODE MAPPING;  Surgeon: Erroll Luna, MD;  Location: [place];  Service: General;  Laterality: Right;  . BREAST SURGERY  1997   Breast Reduction   . CYSTOSCOPY W/ URETERAL STENT REMOVAL  11/09/2012   Procedure: CYSTOSCOPY WITH STENT REMOVAL;  Surgeon: Alexis Frock, MD;  Location: [facility];  Service: Urology;  Laterality: Right;  . CYSTOSCOPY WITH RETROGRADE PYELOGRAM, URETEROSCOPY AND STENT PLACEMENT  11/09/2012   Procedure: CYSTOSCOPY WITH RETROGRADE PYELOGRAM, URETEROSCOPY AND STENT PLACEMENT;  Surgeon: Alexis Frock, MD;  Location: [facility];  Service: Urology;  Laterality: Left;  LEFT URETEROSCOPY, STONE MANIPULATION, left STENT exchange   . CYSTOSCOPY WITH STENT PLACEMENT  10/02/2012   Procedure: CYSTOSCOPY WITH STENT PLACEMENT;  Surgeon: Alexis Frock, MD;  Location: [facility];  Service: Urology;  Laterality: Left;  . DEBRIDEMENT AND CLOSURE WOUND N/A 01/20/2021   Procedure: Excision of abdominal wound with closure;  Surgeon: Wallace Going, DO;  Location: [place];  Service: Plastics;  Laterality: N/A;  . DILATION AND CURETTAGE OF UTERUS  Feb 2004   s/p for 1st trimester nonviable pregnancy  . EYE SURGERY     sty under eyelid  . INCISE AND DRAIN ABCESS  Nov 03   s/p I &D for righ inframmary fold hidradenitis  . INCISION AND DRAINAGE PERITONSILLAR ABSCESS  Mar 03  . IR CV LINE INJECTION  06/07/2018  . IR IMAGING GUIDED PORT INSERTION  06/20/2018  . IR REMOVAL TUN ACCESS W/ PORT W/O FL MOD SED  06/20/2018  . IRRIGATION AND DEBRIDEMENT ABSCESS  01/31/2012   Procedure: IRRIGATION AND DEBRIDEMENT ABSCESS;  Surgeon: Shann Medal, MD;  Location: [facility];  Service: General;  Laterality: Right;  right breast and axilla   . NEPHROLITHOTOMY  10/02/2012   Procedure:  NEPHROLITHOTOMY PERCUTANEOUS;  Surgeon: Alexis Frock, MD;  Location: [facility];  Service: Urology;  Laterality: Right;  First Stage Percutaneous Nephrolithotomy with Surgeon Access, Left Ureteral Stent    . NEPHROLITHOTOMY  10/04/2012   Procedure: NEPHROLITHOTOMY PERCUTANEOUS SECOND LOOK;  Surgeon: Alexis Frock, MD;  [facility]: [facility];  Service: Urology;  Laterality: Right;     . NEPHROLITHOTOMY  10/08/2012   Procedure: NEPHROLITHOTOMY PERCUTANEOUS;  Surgeon: Alexis Frock, MD;  Location: [facility];  Service: Urology;  Laterality: Right;  THIRD STAGE, nephrostomy tube exchange x 2  . NEPHROLITHOTOMY  10/11/2012   Procedure: NEPHROLITHOTOMY PERCUTANEOUS SECOND LOOK;  Surgeon: Alexis Frock, MD;  Location: [facility];  Service: Urology;  Laterality: Right;  RIGHT 4 STAGE PERCUTANOUS NEPHROLITHOTOMY, right URETEROSCOPY WITH HOLMIUM LASER   . PANNICULECTOMY N/A 12/21/2020   Procedure: PANNICULECTOMY;  Surgeon: Wallace Going, DO;  Location: [place];  Service: Plastics;  Laterality: N/A;  . PORT-A-CATH REMOVAL N/A 07/16/2020   Procedure: REMOVAL PORT-A-CATH;  Surgeon: Erroll Luna, MD;  Location: [place];  Service: General;  Laterality: N/A;  . PORTACATH PLACEMENT Left 05/17/2018   Procedure: INSERTION PORT-A-CATH;  Surgeon: Coralie Keens, MD;  Location: [place];  Service: General;  Laterality: Left;  .  RADICAL NECK DISSECTION  04/12/2017   limited/notes 04/12/2017  . RADICAL NECK DISSECTION N/A 04/12/2017   Procedure: RADICAL NECK DISSECTION;  Surgeon: Melida Quitter, MD;  Location: [place];  Service: ENT;  Laterality: N/A;  limited neck dissection 2 hours total  . REDUCTION MAMMAPLASTY Bilateral 1998  . RIGHT/LEFT HEART CATH AND CORONARY ANGIOGRAPHY N/A 03/12/2020   Procedure: RIGHT/LEFT HEART CATH AND CORONARY ANGIOGRAPHY;  Surgeon: Jolaine Artist, MD;  Location: [place] [facility];  Service: Cardiovascular;  Laterality: N/A;  . [place]  1994  . THYROIDECTOMY   04/12/2017   completion/notes 04/12/2017  . THYROIDECTOMY N/A 04/12/2017   Procedure: THYROIDECTOMY;  Surgeon: Melida Quitter, MD;  Location: [place];  Service: ENT;  Laterality: N/A;  Completion Thyroidectomy  . TOTAL THYROIDECTOMY  2010   Family History  Problem Relation Age of Onset  . Hypertension Mother   . Cancer Mother        laryngeal  . Heart disease Mother        stent  . Hypertension Father   . Lung cancer Father 44       hx smoking  . Heart disease Other   . Hypertension Other   . Stroke Other        Grandparent  . Kidney disease Other        Grandparent  . Diabetes Other        FH of Diabetes  . Hypertension Sister   . Cancer Maternal Uncle        Lung CA  . Hypertension Brother   . Hypertension Sister   . Breast cancer Maternal Aunt 65  . Breast cancer Paternal Aunt 77  . Prostate cancer Paternal Uncle   . Breast cancer Maternal Aunt        dx 60+  . Breast cancer Paternal Aunt        dx 69's  . Breast cancer Paternal Aunt        dx 55's  . Prostate cancer Paternal Uncle   . Lung cancer Paternal Uncle   . Breast cancer Cousin 36  . Breast cancer Cousin        dx <50  . Breast cancer Cousin        dx <50  . Breast cancer Cousin        dx <50  . Colon cancer Neg Hx   . Esophageal cancer Neg Hx   . Rectal cancer Neg Hx   . Stomach cancer Neg Hx    Social History   Tobacco Use  . Smoking status: Former Smoker    Packs/day: 0.50    Years: 15.00    Pack years: 7.50    Types: Cigarettes    Start date: 04/12/2017    Quit date: 2019    Years since quitting: 3.3  . Smokeless tobacco: Never Used  Vaping Use  . Vaping Use: Never used  Substance Use Topics  . Alcohol use: Yes    Alcohol/week: 0.0 standard drinks    Comment: social  . Drug use: No    Current Outpatient Medications:  .  acetaminophen (TYLENOL) 500 MG tablet, Take 1 tablet (500 mg total) by mouth every 6 (six) hours as needed. For use AFTER surgery (Patient taking differently: Take  500 mg by mouth every 6 (six) hours as needed for moderate pain. For use AFTER surgery), Disp: 30 tablet, Rfl: 0 .  albuterol (VENTOLIN HFA) 108 (90 Base) MCG/ACT inhaler, Inhale 2 puffs into the lungs every  6 (six) hours as needed for wheezing or shortness of breath., Disp: 8 g, Rfl: 2 .  allopurinol (ZYLOPRIM) 100 MG tablet, Take 1 tablet (100 mg total) by mouth daily. For gout prevention., Disp: 90 tablet, Rfl: 0 .  bictegravir-emtricitabine-tenofovir AF (BIKTARVY) 50-200-25 MG TABS tablet, Take 1 tablet by mouth daily., Disp: 30 tablet, Rfl: 11 .  Blood Glucose Monitoring Suppl (FREESTYLE LITE) w/Device KIT, Inject 1 each into the skin daily., Disp: 1 kit, Rfl: 0 .  candesartan (ATACAND) 32 MG tablet, Take 1 tablet (32 mg total) by mouth daily., Disp: 30 tablet, Rfl: 11 .  carvedilol (COREG) 25 MG tablet, Take 1 tablet (25 mg total) by mouth 2 (two) times daily., Disp: 180 tablet, Rfl: 3 .  cetirizine (ZYRTEC) 10 MG tablet, Take 1 tablet by mouth at bedtime for allergies., Disp: 90 tablet, Rfl: 0 .  Cholecalciferol (VITAMIN D) 50 MCG (2000 UT) tablet, Take 4,000 Units by mouth daily., Disp: , Rfl:  .  dolutegravir ([name]) 50 MG tablet, Take 50 mg by mouth daily., Disp: , Rfl:  .  doxycycline (VIBRA-TABS) 100 MG tablet, Take 100 mg by mouth daily., Disp: , Rfl:  .  empagliflozin (JARDIANCE) 10 MG TABS tablet, Take 1 tablet (10 mg total) by mouth daily before breakfast., Disp: 30 tablet, Rfl: 6 .  emtricitabine-tenofovir AF (DESCOVY) 200-25 MG tablet, Take 1 tablet by mouth daily., Disp: , Rfl:  .  famotidine (PEPCID) 20 MG tablet, Take 1 or 2 tablets by mouth at bedtime for hoarse voice/reflux, Disp: 180 tablet, Rfl: 0 .  FLOVENT HFA 110 MCG/ACT inhaler, TAKE 1 PUFF BY MOUTH TWICE A DAY (Patient taking differently: Inhale 1 puff into the lungs 2 (two) times daily as needed (shortness of breath).), Disp: 36 Inhaler, Rfl: 1 .  gabapentin (NEURONTIN) 300 MG capsule, Take 1 capsule (300 mg total) by  mouth 3 (three) times daily. (Patient taking differently: Take 300 mg by mouth daily.), Disp: 90 capsule, Rfl: 3 .  glipiZIDE (GLUCOTROL) 5 MG tablet, TAKE 1 TABLET (5 MG TOTAL) BY MOUTH 2 (TWO) TIMES DAILY BEFORE A MEAL. FOR DIABETES. (Patient taking differently: Take 5 mg by mouth 2 (two) times daily before a meal.), Disp: 180 tablet, Rfl: 1 .  glucose blood (ONETOUCH VERIO) test strip, USE AS INSTRUCTED TO TEST BLOOD SUGAR DAILY, Disp: 100 each, Rfl: 2 .  Lancets (ONETOUCH ULTRASOFT) lancets, Use as instructed to test blood sugar daily, Disp: 100 each, Rfl: 5 .  levothyroxine (SYNTHROID) 112 MCG tablet, TAKE 1 TABLET BY MOUTH DAILY BEFORE BREAKFAST. (Patient taking differently: Take 112 mcg by mouth daily before breakfast.), Disp: 30 tablet, Rfl: 5 .  potassium chloride SA (KLOR-CON) 20 MEQ tablet, Take 1 tablet (20 mEq total) by mouth 2 (two) times daily. For low potassium., Disp: 10 tablet, Rfl: 0 .  rosuvastatin (CRESTOR) 10 MG tablet, TAKE 1 TABLET BY MOUTH EVERY DAY FOR CHOLESTEROL (Patient taking differently: Take 10 mg by mouth daily.), Disp: 90 tablet, Rfl: 3 .  spironolactone (ALDACTONE) 25 MG tablet, Take 1 tablet (25 mg total) by mouth daily., Disp: 90 tablet, Rfl: 3 .  tamoxifen (NOLVADEX) 20 MG tablet, TAKE 1 TABLET BY MOUTH EVERY DAY (Patient taking differently: Take 20 mg by mouth daily.), Disp: 90 tablet, Rfl: 4 .  TRULICITY 2.54 YH/0.6CB SOPN, INJECT CONTENTS OF 1 PEN (0.$Remove'75MG'byfjqTK$ ) INTO THE SKIN ONCE WEEKLY FOR DIABETES (Patient taking differently: Inject 0.75 mg as directed every Sunday.), Disp: 2 mL, Rfl: 3 .  sertraline (  ZOLOFT) 50 MG tablet, TAKE 1 TABLET (50 MG TOTAL) BY MOUTH DAILY. FOR ANXIETY AND DEPRESSION. (Patient not taking: Reported on 03/08/2021), Disp: 90 tablet, Rfl: 1 No current facility-administered medications for this visit.  Facility-Administered Medications Ordered in Other Visits:  .  sodium chloride flush (NS) 0.9 % injection 10 mL, 10 mL, Intracatheter, PRN,  Magrinat, Virgie Dad, MD, 10 mL at 09/19/18 1551  Allergies  Allergen Reactions  . Genvoya [Elviteg-Cobic-Emtricit-Tenofaf] Hives  . Lisinopril Cough   ROS: See pertinent positives and negatives per HPI.  OBSERVATIONS/OBJECTIVE:  VITALS per patient if applicable: Today's Vitals   03/08/21 1606  BP: (!) 184/76  Temp: 98.5 F (36.9 C)  TempSrc: Temporal  Weight: 208 lb (94.3 kg)  Height: $Remove'5\' 5"'JEHUjLC$  (1.651 m)   Body mass index is 34.61 kg/m.   GENERAL: Alert and oriented. Appears well and in no acute distress.  HEENT: Atraumatic. Conjunctiva clear. No obvious abnormalities on inspection of external nose and ears.  NECK: Normal movements of the head and neck.  LUNGS: On inspection, no signs of respiratory distress. Breathing rate appears normal. No obvious gross SOB, gasping or wheezing, and no conversational dyspnea.  CV: No obvious cyanosis.  MS: Moves all visible extremities without noticeable abnormality.  PSYCH/NEURO: Pleasant and cooperative. No obvious depression or anxiety. Speech and thought processing grossly intact.  ASSESSMENT AND PLAN:  1. Viral upper respiratory illness Most of Ms. Dunlow's symptoms appear to be viral in nature. However, I cannot exclude COVID at this point. Although she has had several tests that are negative, I remain suspicious. She would be at moderate risk of complications for COVID. She may need additional testing.  2. Hematuria, unspecified type The hematuria is concerning. This could represent a bladder infection, recurrent kidney stone, or other cause of hematuria. I feel Ms. Waltman needs an in-person evaluation, including a urinalysis to evaluate. I recommended she seek care at an Urgent Care for this.  3. Essential hypertension I am also concerned with her elevated blood pressure. This needs to be rechecked in person. This may be contributing to the headache she is experiencing. Again, I recommended she seek in-person evaluation at an Urgent  Care.   I discussed the assessment and treatment plan with the patient. The patient was provided an opportunity to ask questions and all were answered. The patient agreed with the plan and demonstrated an understanding of the instructions.   Haydee Salter, MD

## 2021-03-09 ENCOUNTER — Ambulatory Visit
Admission: EM | Admit: 2021-03-09 | Discharge: 2021-03-09 | Disposition: A | Discharge status: Home / Self Care | Payer: 59 | Attending: Emergency Medicine | Admitting: Emergency Medicine

## 2021-03-09 ENCOUNTER — Other Ambulatory Visit: Payer: Self-pay

## 2021-03-09 DIAGNOSIS — N390 Urinary tract infection, site not specified: Secondary | ICD-10-CM | POA: Insufficient documentation

## 2021-03-09 DIAGNOSIS — R319 Hematuria, unspecified: Secondary | ICD-10-CM | POA: Diagnosis not present

## 2021-03-09 DIAGNOSIS — J069 Acute upper respiratory infection, unspecified: Secondary | ICD-10-CM | POA: Insufficient documentation

## 2021-03-09 LAB — POCT URINALYSIS DIP (MANUAL ENTRY)
Bilirubin, UA: NEGATIVE
Glucose, UA: NEGATIVE mg/dL
Ketones, POC UA: NEGATIVE mg/dL
Nitrite, UA: NEGATIVE
Protein Ur, POC: >=300 mg/dL — AB
Spec Grav, UA: 1.02 (ref 1.010–1.025)
Urobilinogen, UA: 0.2 E.U./dL
pH, UA: 7.5 (ref 5.0–8.0)

## 2021-03-09 MED ORDER — SULFAMETHOXAZOLE-TRIMETHOPRIM 800-160 MG PO TABS: 1.0000 | ORAL_TABLET | Freq: Two times a day (BID) | ORAL | 0 refills | Status: AC

## 2021-03-09 MED ORDER — BENZONATATE 100 MG PO CAPS: 100.0000 mg | ORAL_CAPSULE | Freq: Three times a day (TID) | ORAL | 0 refills | Status: DC

## 2021-03-09 MED ORDER — ONDANSETRON 4 MG PO TBDP: 4.0000 mg | ORAL_TABLET | Freq: Three times a day (TID) | ORAL | 0 refills | Status: DC | PRN

## 2021-03-09 NOTE — ED Triage Notes (Signed)
Pt reports a 6 day h/o HA, chill, low grade subjective fever. Two days of diarrhea. One episode of hematuria yesterday. Confirms urgency and frequency. Tested negative at home for COVID. Family members are iil.Marland Kitchen

## 2021-03-09 NOTE — Discharge Instructions (Addendum)
Begin Bactrim twice daily for 1 week to treat UTI Drink plenty of fluids Zofran as needed for nausea Tessalon for cough every 8 hours May use with over-the-counter Mucinex DM for further relief of cough and congestion Tylenol for fever, headache, body aches Repeat COVID/flu test pending, monitor MyChart for results  Follow-up if any symptoms not improving or worsening

## 2021-03-09 NOTE — ED Provider Notes (Signed)
[facility]    CSN: [id number] Arrival date & time: 03/09/21  9470      History   Chief Complaint Chief Complaint  Patient presents with  . Diarrhea  . Headache    HPI [name] is a 55 y.o. female history of CKD stage III, DM type II, hyperlipidemia, GERD, asthma, presenting today for evaluation of URI symptoms and possible UTI.  Patient reports over the past 6 days she has had headache, body aches, cold chills and subjective fevers.  Reports mild diarrhea over the past couple days.  Reports associated nausea, denies vomiting.  Appetite decreased.  Reports mild cough and congestion as well.  Noticed hematuria, dysuria urgency and frequency beginning yesterday.  Does report history of UTIs.  Does report prior COVID testing, but would like to repeat today to recheck as she has had positive exposures.  HPI  Past Medical History:  Diagnosis Date  . Allergy   . Anemia    Normocytic  . Anxiety   . Asthma   . Blood dyscrasia   . Bronchitis 2005  . CKD (chronic kidney disease)   . 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 Diabetes mellitus without complication ([place])   . Family history of breast cancer   . Family history of lung cancer   . Family history of prostate cancer   . Gastroenteritis 07/10/07  . GERD 07/24/2006  . GRAVE'S DISEASE 01/01/2008  . History of hidradenitis suppurativa   . History of kidney stones   . History of thrush   . HIV DISEASE 07/24/2006   dx March 05  . Hyperlipidemia   . HYPERTENSION 07/24/2006  . Hyperthyroidism 08/2006   Grave's Disease -diffuse radiotracer uptake 08/25/06 Thyroid scan-Cold nodule to R lower lobe of thyrorid  . Menometrorrhagia    hx of  . Nephrolithiasis   . 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  . Personal history  of chemotherapy    2020  . Personal history of radiation therapy    2020  . Pneumonia 2005  . 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 Sarcoidosis 02/08/2007   dx as a teenager in [place] from abnl CXR. Completed 2 yrs Prednisone after lung bx confirmation. No symptoms since then.  . Suppurative hidradenitis   . Thyroid cancer ([place])   . THYROID NODULE, RIGHT 02/08/2007    Patient Active Problem List   Diagnosis Date Noted  . Voice hoarseness 12/15/2020  . Recurrent falls 11/05/2020  . Antibiotic-induced yeast infection 09/28/2020  . Dysuria 09/28/2020  . Impingement syndrome of left shoulder region 05/27/2020  . Panniculitis 05/12/2020  . Inclusion cyst 05/01/2020  . Sebaceous cyst 04/21/2020  . Postoperative breast asymmetry 02/28/2020  . Cognitive dysfunction 02/21/2020  . Chronic gout 01/27/2020  . Hematuria 12/10/2019  . Neuropathy due to chemotherapeutic drug ([place]) 12/10/2019  . Bursitis of left shoulder 09/06/2019  . Pain in joint of left shoulder 09/05/2019  . Dysphagia 07/23/2019  . CKD stage 3 due to type 2 diabetes mellitus ([place]) 07/12/2019  . Normocytic anemia 07/12/2019  . Preventative health care 09/10/2018  . Genetic testing 07/25/2018  . Port-A-Cath in place 07/12/2018  . Family history of breast cancer   . Family history of prostate cancer   . Family history of lung cancer   . Malignant neoplasm of lower-inner quadrant of right breast of female, estrogen receptor  positive ([place]) 05/10/2018  . Hyperlipidemia 04/26/2017  . GAD (generalized anxiety disorder) 04/11/2017  . Mass of left side of neck 11/24/2016  . Laryngopharyngeal reflux (LPR) 11/24/2016  . Upper airway cough syndrome 11/11/2016  . Morbid (severe) obesity due to excess calories ([place]) 10/14/2016  . Allergic rhinitis 02/18/2016  . Non-insulin dependent type 2 diabetes mellitus ([place]) 06/23/2015  . Renal calculi 10/29/2014  . Recurrent boils 06/11/2014  . Hidradenitis suppurativa of right >>  left axilla 01/30/2012  . 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 Sarcoidosis 02/08/2007  . Cigarette smoker 02/08/2007  . HIV disease ([place]) 07/24/2006  . Depression 07/24/2006  . Essential hypertension 07/24/2006  . Asthma 07/24/2006  . GERD 07/24/2006    Past Surgical History:  Procedure Laterality Date  . APPLICATION OF WOUND VAC N/A 01/20/2021   Procedure: APPLICATION OF WOUND VAC;  Surgeon: Wallace Going, DO;  Location: [place];  Service: Plastics;  Laterality: N/A;  . BREAST EXCISIONAL BIOPSY Right 04/26/2018   right axilla negative  . BREAST EXCISIONAL BIOPSY Left 04/26/2018   left axilla negative  . BREAST LUMPECTOMY Right 10/03/2018   malignant  . BREAST LUMPECTOMY WITH RADIOACTIVE SEED AND SENTINEL LYMPH NODE BIOPSY Right 10/03/2018   Procedure: RIGHT BREAST LUMPECTOMY WITH RADIOACTIVE SEED AND SENTINEL LYMPH NODE MAPPING;  Surgeon: Erroll Luna, MD;  Location: [facility];  Service: General;  Laterality: Right;  . BREAST SURGERY  1997   Breast Reduction   . CYSTOSCOPY W/ URETERAL STENT REMOVAL  11/09/2012   Procedure: CYSTOSCOPY WITH STENT REMOVAL;  Surgeon: Alexis Frock, MD;  Location: [facility];  Service: Urology;  Laterality: Right;  . CYSTOSCOPY WITH RETROGRADE PYELOGRAM, URETEROSCOPY AND STENT PLACEMENT  11/09/2012   Procedure: CYSTOSCOPY WITH RETROGRADE PYELOGRAM, URETEROSCOPY AND STENT PLACEMENT;  Surgeon: Alexis Frock, MD;  Location: [facility];  Service: Urology;  Laterality: Left;  LEFT URETEROSCOPY, STONE MANIPULATION, left STENT exchange   . CYSTOSCOPY WITH STENT PLACEMENT  10/02/2012   Procedure: CYSTOSCOPY WITH STENT PLACEMENT;  Surgeon: Alexis Frock, MD;  Location: [facility];  Service: Urology;  Laterality: Left;  . DEBRIDEMENT AND CLOSURE WOUND N/A 01/20/2021   Procedure: Excision of abdominal wound with closure;  Surgeon: Wallace Going, DO;  Location: [facility];  Service: Plastics;  Laterality: N/A;  . DILATION AND  CURETTAGE OF UTERUS  Feb 2004   s/p for 1st trimester nonviable pregnancy  . EYE SURGERY     sty under eyelid  . INCISE AND DRAIN ABCESS  Nov 03   s/p I &D for righ inframmary fold hidradenitis  . INCISION AND DRAINAGE PERITONSILLAR ABSCESS  Mar 03  . IR CV LINE INJECTION  06/07/2018  . IR IMAGING GUIDED PORT INSERTION  06/20/2018  . IR REMOVAL TUN ACCESS W/ PORT W/O FL MOD SED  06/20/2018  . IRRIGATION AND DEBRIDEMENT ABSCESS  01/31/2012   Procedure: IRRIGATION AND DEBRIDEMENT ABSCESS;  Surgeon: Shann Medal, MD;  [facility];  Service: General;  Laterality: Right;  right breast and axilla   . NEPHROLITHOTOMY  10/02/2012   Procedure: NEPHROLITHOTOMY PERCUTANEOUS;  Surgeon: Alexis Frock, MD;  [facility];  Service: Urology;  Laterality: Right;  First Stage Percutaneous Nephrolithotomy with Surgeon Access, Left Ureteral Stent    . NEPHROLITHOTOMY  10/04/2012   Procedure: NEPHROLITHOTOMY PERCUTANEOUS SECOND LOOK;  Surgeon: Alexis Frock, MD;  [facility];  Service: Urology;  Laterality: Right;     . NEPHROLITHOTOMY  10/08/2012   Procedure: NEPHROLITHOTOMY PERCUTANEOUS;  Surgeon:  Alexis Frock, MD;  [facility];  Service: Urology;  Laterality: Right;  THIRD STAGE, nephrostomy tube exchange x 2  . NEPHROLITHOTOMY  10/11/2012   Procedure: NEPHROLITHOTOMY PERCUTANEOUS SECOND LOOK;  Surgeon: Alexis Frock, MD;  [facility];  Service: Urology;  Laterality: Right;  RIGHT 4 STAGE PERCUTANOUS NEPHROLITHOTOMY, right URETEROSCOPY WITH HOLMIUM LASER   . PANNICULECTOMY N/A 12/21/2020   Procedure: PANNICULECTOMY;  Surgeon: Wallace Going, DO;  Location: [facility];  Service: Plastics;  Laterality: N/A;  . PORT-A-CATH REMOVAL N/A 07/16/2020   Procedure: REMOVAL PORT-A-CATH;  Surgeon: Erroll Luna, MD;  Location: [facility];  Service: General;  Laterality: N/A;  . PORTACATH PLACEMENT Left 05/17/2018   Procedure: INSERTION PORT-A-CATH;  Surgeon: Coralie Keens, MD;   Location: [facility];  Service: General;  Laterality: Left;  . RADICAL NECK DISSECTION  04/12/2017   limited/notes 04/12/2017  . RADICAL NECK DISSECTION N/A 04/12/2017   Procedure: RADICAL NECK DISSECTION;  Surgeon: Melida Quitter, MD;  Location: [facility];  Service: ENT;  Laterality: N/A;  limited neck dissection 2 hours total  . REDUCTION MAMMAPLASTY Bilateral 1998  . RIGHT/LEFT HEART CATH AND CORONARY ANGIOGRAPHY N/A 03/12/2020   Procedure: RIGHT/LEFT HEART CATH AND CORONARY ANGIOGRAPHY;  Surgeon: Jolaine Artist, MD;  Location: [facility];  Service: Cardiovascular;  Laterality: N/A;  . [place]  1994  . THYROIDECTOMY  04/12/2017   completion/notes 04/12/2017  . THYROIDECTOMY N/A 04/12/2017   Procedure: THYROIDECTOMY;  Surgeon: Melida Quitter, MD;  Location: [facility];  Service: ENT;  Laterality: N/A;  Completion Thyroidectomy  . TOTAL THYROIDECTOMY  2010    OB History   No obstetric history on file.      Home Medications    Prior to Admission medications   Medication Sig Start Date End Date Taking? Authorizing Provider  benzonatate (TESSALON) 100 MG capsule Take 1-2 capsules (100-200 mg total) by mouth every 8 (eight) hours. 03/09/21  Yes Maclean Foister C, PA-C  ondansetron (ZOFRAN ODT) 4 MG disintegrating tablet Take 1 tablet (4 mg total) by mouth every 8 (eight) hours as needed for nausea or vomiting. 03/09/21  Yes Jamahl Lemmons C, PA-C  sulfamethoxazole-trimethoprim (BACTRIM DS) 800-160 MG tablet Take 1 tablet by mouth 2 (two) times daily for 7 days. 03/09/21 03/16/21 Yes Christella App C, PA-C  acetaminophen (TYLENOL) 500 MG tablet Take 1 tablet (500 mg total) by mouth every 6 (six) hours as needed. For use AFTER surgery Patient taking differently: Take 500 mg by mouth every 6 (six) hours as needed for moderate pain. For use AFTER surgery 12/01/20   Phoebe Sharps C, PA-C  albuterol (VENTOLIN HFA) 108 (90 Base) MCG/ACT inhaler Inhale 2 puffs into the lungs every 6  (six) hours as needed for wheezing or shortness of breath. 10/07/20   Hall-Potvin, [place], PA-C  allopurinol (ZYLOPRIM) 100 MG tablet Take 1 tablet (100 mg total) by mouth daily. For gout prevention. 12/15/20   [name], NP  bictegravir-emtricitabine-tenofovir AF (BIKTARVY) 50-200-25 MG TABS tablet Take 1 tablet by mouth daily. 09/18/20   Kuppelweiser, Cassie L, RPH-CPP  Blood Glucose Monitoring Suppl (FREESTYLE LITE) w/Device KIT Inject 1 each into the skin daily. 11/10/20   Skeet Latch, MD  candesartan (ATACAND) 32 MG tablet Take 1 tablet (32 mg total) by mouth daily. 12/28/20   Skeet Latch, MD  carvedilol (COREG) 25 MG tablet Take 1 tablet (25 mg total) by mouth 2 (two) times daily. 08/13/20 11/11/20  Skeet Latch, MD  cetirizine (  ZYRTEC) 10 MG tablet Take 1 tablet by mouth at bedtime for allergies. 12/15/20   [name], NP  Cholecalciferol (VITAMIN D) 50 MCG (2000 UT) tablet Take 4,000 Units by mouth daily.    [provider]  dolutegravir ([name]) 50 MG tablet Take 50 mg by mouth daily.    [provider]  empagliflozin (JARDIANCE) 10 MG TABS tablet Take 1 tablet (10 mg total) by mouth daily before breakfast. 07/08/20   Bensimhon, Shaune Pascal, MD  emtricitabine-tenofovir AF (DESCOVY) 200-25 MG tablet Take 1 tablet by mouth daily.    [provider]  famotidine (PEPCID) 20 MG tablet Take 1 or 2 tablets by mouth at bedtime for hoarse voice/reflux 12/15/20   [name], NP  FLOVENT HFA 110 MCG/ACT inhaler TAKE 1 PUFF BY MOUTH TWICE A DAY Patient taking differently: Inhale 1 puff into the lungs 2 (two) times daily as needed (shortness of breath). 08/27/19   [name], NP  gabapentin (NEURONTIN) 300 MG capsule Take 1 capsule (300 mg total) by mouth 3 (three) times daily. Patient taking differently: Take 300 mg by mouth daily. 02/21/20   Vaslow, Acey Lav, MD  glipiZIDE (GLUCOTROL) 5 MG tablet TAKE 1 TABLET (5 MG TOTAL) BY MOUTH 2  (TWO) TIMES DAILY BEFORE A MEAL. FOR DIABETES. Patient taking differently: Take 5 mg by mouth 2 (two) times daily before a meal. 08/18/20   [name], NP  glucose blood [facility] VERIO) test strip USE AS INSTRUCTED TO TEST BLOOD SUGAR DAILY 11/25/20   [name], NP  Lancets [facility] ULTRASOFT) lancets Use as instructed to test blood sugar daily 11/25/20   [name], NP  levothyroxine (SYNTHROID) 112 MCG tablet TAKE 1 TABLET BY MOUTH DAILY BEFORE BREAKFAST. Patient taking differently: Take 112 mcg by mouth daily before breakfast. 12/11/20   Renato Shin, MD  potassium chloride SA (KLOR-CON) 20 MEQ tablet Take 1 tablet (20 mEq total) by mouth 2 (two) times daily. For low potassium. 11/06/20   [name], NP  rosuvastatin (CRESTOR) 10 MG tablet TAKE 1 TABLET BY MOUTH EVERY DAY FOR CHOLESTEROL Patient taking differently: Take 10 mg by mouth daily. 11/16/20   [name], NP  sertraline (ZOLOFT) 50 MG tablet TAKE 1 TABLET (50 MG TOTAL) BY MOUTH DAILY. FOR ANXIETY AND DEPRESSION. Patient not taking: Reported on 03/08/2021 10/21/20   [name], NP  spironolactone (ALDACTONE) 25 MG tablet Take 1 tablet (25 mg total) by mouth daily. 09/18/20   Bensimhon, Shaune Pascal, MD  tamoxifen (NOLVADEX) 20 MG tablet TAKE 1 TABLET BY MOUTH EVERY DAY Patient taking differently: Take 20 mg by mouth daily. 09/16/20   Causey, Charlestine Massed, NP  TRULICITY 3.15 VV/6.1YW SOPN INJECT CONTENTS OF 1 PEN (0.$Remove'75MG'mqtZkAl$ ) INTO THE SKIN ONCE WEEKLY FOR DIABETES Patient taking differently: Inject 0.75 mg as directed every Sunday. 11/25/20   [name], NP  prochlorperazine (COMPAZINE) 10 MG tablet TAKE 1 TABLET BY MOUTH EVERY 6 HOURS AS NEEDED FOR NAUSEA OR VOMITING 08/24/18 12/27/18  Magrinat, Virgie Dad, MD    Family History Family History  Problem Relation Age of Onset  . Hypertension Mother   . Cancer Mother        laryngeal  . Heart disease Mother        stent  . Hypertension Father    . Lung cancer Father 43       hx smoking  . Heart disease Other   . Hypertension Other   .  Stroke Other        Grandparent  . Kidney disease Other        Grandparent  . Diabetes Other        FH of Diabetes  . Hypertension Sister   . Cancer Maternal Uncle        Lung CA  . Hypertension Brother   . Hypertension Sister   . Breast cancer Maternal Aunt 65  . Breast cancer Paternal Aunt 24  . Prostate cancer Paternal Uncle   . Breast cancer Maternal Aunt        dx 60+  . Breast cancer Paternal Aunt        dx 44's  . Breast cancer Paternal Aunt        dx 71's  . Prostate cancer Paternal Uncle   . Lung cancer Paternal Uncle   . Breast cancer Cousin 48  . Breast cancer Cousin        dx <50  . Breast cancer Cousin        dx <50  . Breast cancer Cousin        dx <50  . Colon cancer Neg Hx   . Esophageal cancer Neg Hx   . Rectal cancer Neg Hx   . Stomach cancer Neg Hx     Social History Social History   Tobacco Use  . Smoking status: Former Smoker    Packs/day: 0.50    Years: 15.00    Pack years: 7.50    Types: Cigarettes    Start date: 04/12/2017    Quit date: 2019    Years since quitting: 3.3  . Smokeless tobacco: Never Used  Vaping Use  . Vaping Use: Never used  Substance Use Topics  . Alcohol use: Yes    Alcohol/week: 0.0 standard drinks    Comment: social  . Drug use: No     Allergies   Genvoya [elviteg-cobic-emtricit-tenofaf] and Lisinopril   Review of Systems Review of Systems  Constitutional: Positive for appetite change, chills, fatigue and fever. Negative for activity change.  HENT: Positive for congestion and rhinorrhea. Negative for ear pain, sinus pressure, sore throat and trouble swallowing.   Eyes: Negative for discharge and redness.  Respiratory: Positive for cough. Negative for chest tightness and shortness of breath.   Cardiovascular: Negative for chest pain.  Gastrointestinal: Positive for diarrhea and nausea. Negative for abdominal  pain and vomiting.  Genitourinary: Positive for dysuria, frequency, hematuria and urgency.  Musculoskeletal: Negative for myalgias.  Skin: Negative for rash.  Neurological: Positive for headaches. Negative for dizziness and light-headedness.     Physical Exam Triage Vital Signs ED Triage Vitals  Enc Vitals Group     BP      Pulse      Resp      Temp      Temp src      SpO2      Weight      Height      Head Circumference      Peak Flow      Pain Score      Pain Loc      Pain Edu?      Excl. in GC?    No data found.  Updated Vital Signs BP 111/63 (BP Location: Left Arm)   Pulse 79   Temp 100.2 F (37.9 C) (Oral)   Resp 18   LMP 03/31/2014 (LMP Unknown)   SpO2 96%   Visual Acuity Right Eye Distance:  Left Eye Distance:   Bilateral Distance:    Right Eye Near:   Left Eye Near:    Bilateral Near:     Physical Exam Vitals and nursing note reviewed.  Constitutional:      Appearance: She is well-developed.     Comments: No acute distress  HENT:     Head: Normocephalic and atraumatic.     Ears:     Comments: Bilateral ears without tenderness to palpation of external auricle, tragus and mastoid, EAC's without erythema or swelling, TM's with good bony landmarks and cone of light. Non erythematous.     Nose: Nose normal.     Mouth/Throat:     Comments: Oral mucosa pink and moist, no tonsillar enlargement or exudate. Posterior pharynx patent and nonerythematous, no uvula deviation or swelling. Normal phonation. Eyes:     Conjunctiva/sclera: Conjunctivae normal.  Cardiovascular:     Rate and Rhythm: Normal rate.  Pulmonary:     Effort: Pulmonary effort is normal. No respiratory distress.     Comments: Breathing comfortably at rest, CTABL, no wheezing, rales or other adventitious sounds auscultated Abdominal:     General: There is no distension.  Musculoskeletal:        General: Normal range of motion.     Cervical back: Neck supple.  Skin:    General: Skin  is warm and dry.  Neurological:     Mental Status: She is alert and oriented to person, place, and time.      UC Treatments / Results  Labs (all labs ordered are listed, but only abnormal results are displayed) Labs Reviewed  POCT URINALYSIS DIP (MANUAL ENTRY) - Abnormal; Notable for the following components:      Result Value   Clarity, UA cloudy (*)    Blood, UA large (*)    Protein Ur, POC >=300 (*)    Leukocytes, UA Large (3+) (*)    All other components within normal limits  URINE CULTURE  COVID-19, FLU A+B NAA    EKG   Radiology No results found.  Procedures Procedures (including critical care time)  Medications Ordered in UC Medications - No data to display  Initial Impression / Assessment and Plan / UC Course  I have reviewed the triage vital signs and the nursing notes.  Pertinent labs & imaging results that were available during my care of the patient were reviewed by me and considered in my medical decision making (see chart for details).     1.  Viral URI with cough-URI symptoms x5 to 6 days with close sick contacts and COVID exposures, prior COVID test negative, the patient wishes to repeat this, will screen for COVID/flu today, continue symptomatic and supportive care, lungs clear, vital signs stable, continue to monitor  2.  UTI- large leuks, large hemoglobin on UA, urine culture pending, treating for UTI, low-grade fever of 100.2 today, unclear if this is related to URI symptoms or underlying UTI, possible pyelonephritis, opting to go ahead and cover and treat with Bactrim, Zofran for nausea, push fluids.  Discussed strict return precautions. Patient verbalized understanding and is agreeable with plan.  Final Clinical Impressions(s) / UC Diagnoses   Final diagnoses:  Viral URI with cough  Urinary tract infection with hematuria, site unspecified     Discharge Instructions     Begin Bactrim twice daily for 1 week to treat UTI Drink plenty of  fluids Zofran as needed for nausea Tessalon for cough every 8 hours May use with over-the-counter Mucinex  DM for further relief of cough and congestion Tylenol for fever, headache, body aches Repeat COVID/flu test pending, monitor MyChart for results  Follow-up if any symptoms not improving or worsening    ED Prescriptions    Medication Sig Dispense Auth. Provider   sulfamethoxazole-trimethoprim (BACTRIM DS) 800-160 MG tablet Take 1 tablet by mouth 2 (two) times daily for 7 days. 14 tablet Jalia Zuniga C, PA-C   ondansetron (ZOFRAN ODT) 4 MG disintegrating tablet Take 1 tablet (4 mg total) by mouth every 8 (eight) hours as needed for nausea or vomiting. 20 tablet Marlissa Emerick C, PA-C   benzonatate (TESSALON) 100 MG capsule Take 1-2 capsules (100-200 mg total) by mouth every 8 (eight) hours. 21 capsule Jamear Carbonneau, [place] C, PA-C     PDMP not reviewed this encounter.   Janith Lima, [place] 03/09/21 [phone]

## 2021-03-10 LAB — COVID-19, FLU A+B NAA
Influenza A, NAA: NOT DETECTED
Influenza B, NAA: NOT DETECTED
SARS-CoV-2, NAA: NOT DETECTED

## 2021-03-12 LAB — URINE CULTURE: Culture: >=100000 — AB

## 2021-03-14 ENCOUNTER — Other Ambulatory Visit: Payer: Self-pay | Admitting: Primary Care

## 2021-03-14 DIAGNOSIS — R490 Dysphonia: Secondary | ICD-10-CM

## 2021-03-14 DIAGNOSIS — M1A09X0 Idiopathic chronic gout, multiple sites, without tophus (tophi): Secondary | ICD-10-CM

## 2021-03-17 ENCOUNTER — Other Ambulatory Visit: Payer: Self-pay

## 2021-03-17 ENCOUNTER — Other Ambulatory Visit: Payer: 59

## 2021-03-17 DIAGNOSIS — B20 Human immunodeficiency virus [HIV] disease: Secondary | ICD-10-CM

## 2021-03-18 LAB — T-HELPER CELL (CD4) - (RCID CLINIC ONLY)
CD4 % Helper T Cell: 28 % — ABNORMAL LOW (ref 33–65)
CD4 T Cell Abs: 626 /uL (ref 400–1790)

## 2021-03-19 LAB — COMPREHENSIVE METABOLIC PANEL
AG Ratio: 0.9 (calc) — ABNORMAL LOW (ref 1.0–2.5)
ALT: 17 U/L (ref 6–29)
AST: 28 U/L (ref 10–35)
Albumin: 3.6 g/dL (ref 3.6–5.1)
Alkaline phosphatase (APISO): 62 U/L (ref 37–153)
BUN/Creatinine Ratio: 7 (calc) (ref 6–22)
BUN: 19 mg/dL (ref 7–25)
CO2: 24 mmol/L (ref 20–32)
Calcium: 8.8 mg/dL (ref 8.6–10.4)
Chloride: 109 mmol/L (ref 98–110)
Creat: 2.74 mg/dL — ABNORMAL HIGH (ref 0.50–1.05)
Globulin: 3.8 g/dL (calc) — ABNORMAL HIGH (ref 1.9–3.7)
Glucose, Bld: 147 mg/dL — ABNORMAL HIGH (ref 65–99)
Potassium: 4.4 mmol/L (ref 3.5–5.3)
Sodium: 138 mmol/L (ref 135–146)
Total Bilirubin: 0.3 mg/dL (ref 0.2–1.2)
Total Protein: 7.4 g/dL (ref 6.1–8.1)

## 2021-03-19 LAB — CBC
HCT: 34.8 % — ABNORMAL LOW (ref 35.0–45.0)
Hemoglobin: 11.1 g/dL — ABNORMAL LOW (ref 11.7–15.5)
MCH: 30.5 pg (ref 27.0–33.0)
MCHC: 31.9 g/dL — ABNORMAL LOW (ref 32.0–36.0)
MCV: 95.6 fL (ref 80.0–100.0)
MPV: 8.9 fL (ref 7.5–12.5)
Platelets: 251 10*3/uL (ref 140–400)
RBC: 3.64 10*6/uL — ABNORMAL LOW (ref 3.80–5.10)
RDW: 16.7 % — ABNORMAL HIGH (ref 11.0–15.0)
WBC: 6 10*3/uL (ref 3.8–10.8)

## 2021-03-19 LAB — LIPID PANEL
Cholesterol: 133 mg/dL (ref <200)
HDL: 30 mg/dL — ABNORMAL LOW (ref >=50)
LDL Cholesterol (Calc): 71 mg/dL (calc)
Non-HDL Cholesterol (Calc): 103 mg/dL (calc) (ref <130)
Total CHOL/HDL Ratio: 4.4 (calc) (ref <5.0)
Triglycerides: 227 mg/dL — ABNORMAL HIGH (ref <150)

## 2021-03-19 LAB — HIV-1 RNA QUANT-NO REFLEX-BLD
HIV 1 RNA Quant: 1490 Copies/mL — ABNORMAL HIGH
HIV-1 RNA Quant, Log: 3.17 Log cps/mL — ABNORMAL HIGH

## 2021-03-19 LAB — RPR: RPR Ser Ql: NONREACTIVE

## 2021-03-29 ENCOUNTER — Other Ambulatory Visit: Payer: Self-pay | Admitting: Infectious Diseases

## 2021-03-29 DIAGNOSIS — B20 Human immunodeficiency virus [HIV] disease: Secondary | ICD-10-CM

## 2021-03-31 ENCOUNTER — Other Ambulatory Visit: Payer: Self-pay

## 2021-03-31 ENCOUNTER — Ambulatory Visit (INDEPENDENT_AMBULATORY_CARE_PROVIDER_SITE_OTHER): Payer: 59 | Admitting: Internal Medicine

## 2021-03-31 ENCOUNTER — Other Ambulatory Visit (HOSPITAL_COMMUNITY): Payer: Self-pay

## 2021-03-31 ENCOUNTER — Encounter: Payer: Self-pay | Admitting: Oncology

## 2021-03-31 DIAGNOSIS — F331 Major depressive disorder, recurrent, moderate: Secondary | ICD-10-CM

## 2021-03-31 DIAGNOSIS — B20 Human immunodeficiency virus [HIV] disease: Secondary | ICD-10-CM | POA: Diagnosis not present

## 2021-03-31 DIAGNOSIS — L732 Hidradenitis suppurativa: Secondary | ICD-10-CM

## 2021-03-31 MED ORDER — EMTRICITABINE-TENOFOVIR AF 200-25 MG PO TABS: 1.0000 | ORAL_TABLET | Freq: Every day | ORAL | 11 refills | Status: DC

## 2021-03-31 MED ORDER — DOLUTEGRAVIR SODIUM 50 MG PO TABS: 50.0000 mg | ORAL_TABLET | Freq: Every day | ORAL | 11 refills | Status: DC

## 2021-03-31 MED ORDER — DOXYCYCLINE HYCLATE 100 MG PO TABS: 100.0000 mg | ORAL_TABLET | Freq: Two times a day (BID) | ORAL | 5 refills | Status: DC

## 2021-03-31 NOTE — Assessment & Plan Note (Signed)
Her viral load is up, most likely due to frequent missed doses in the past few months.  I will check resistance assays today.  She thinks she is having problems tolerating Biktarvy.  We checked on her current insurance does cover Descovy and [name].  I will switch her back now and see her back in 6 weeks.  I encouraged her to go ahead and get a COVID booster vaccine.

## 2021-03-31 NOTE — Progress Notes (Signed)
Patient Active Problem List   Diagnosis Date Noted   Malignant neoplasm of lower-inner quadrant of right breast of female, estrogen receptor positive ([facility]) 05/10/2018    Priority: Medium   Hidradenitis suppurativa of right >> left axilla 01/30/2012    Priority: Medium   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    Priority: Medium   Cigarette smoker 02/08/2007    Priority: Medium   HIV disease ([place]) 07/24/2006    Priority: Medium   Depression 07/24/2006    Priority: Medium   Essential hypertension 07/24/2006    Priority: Medium   Asthma 07/24/2006    Priority: Medium   Voice hoarseness 12/15/2020   Recurrent falls 11/05/2020   Antibiotic-induced yeast infection 09/28/2020   Dysuria 09/28/2020   Impingement syndrome of left shoulder region 05/27/2020   Panniculitis 05/12/2020   Inclusion cyst 05/01/2020   Sebaceous cyst 04/21/2020   Postoperative breast asymmetry 02/28/2020   Cognitive dysfunction 02/21/2020   Chronic gout 01/27/2020   Hematuria 12/10/2019   Neuropathy due to chemotherapeutic drug ([place]) 12/10/2019   Bursitis of left shoulder 09/06/2019   Pain in joint of left shoulder 09/05/2019   Dysphagia 07/23/2019   CKD stage 3 due to type 2 diabetes mellitus ([place]) 07/12/2019   Normocytic anemia 07/12/2019   Preventative health care 09/10/2018   Genetic testing 07/25/2018   Port-A-Cath in place 07/12/2018   Family history of breast cancer    Family history of prostate cancer    Family history of lung cancer    Hyperlipidemia 04/26/2017   GAD (generalized anxiety disorder) 04/11/2017   Mass of left side of neck 11/24/2016   Laryngopharyngeal reflux (LPR) 11/24/2016   Upper airway cough syndrome 11/11/2016   Morbid (severe) obesity due to excess calories ([place]) 10/14/2016   Allergic rhinitis 02/18/2016   Non-insulin dependent type 2 diabetes mellitus ([place]) 06/23/2015   Renal calculi 10/29/2014   Recurrent boils 06/11/2014   Sarcoidosis 02/08/2007    GERD 07/24/2006    Patient's Medications  New Prescriptions   DOXYCYCLINE (VIBRA-TABS) 100 MG TABLET    Take 1 tablet (100 mg total) by mouth 2 (two) times daily.  Previous Medications   ACETAMINOPHEN (TYLENOL) 500 MG TABLET    Take 1 tablet (500 mg total) by mouth every 6 (six) hours as needed. For use AFTER surgery   ALBUTEROL (VENTOLIN HFA) 108 (90 BASE) MCG/ACT INHALER    Inhale 2 puffs into the lungs every 6 (six) hours as needed for wheezing or shortness of breath.   ALLOPURINOL (ZYLOPRIM) 100 MG TABLET    TAKE 1 TABLET (100 MG TOTAL) BY MOUTH DAILY. FOR GOUT PREVENTION.   BENZONATATE (TESSALON) 100 MG CAPSULE    Take 1-2 capsules (100-200 mg total) by mouth every 8 (eight) hours.   BLOOD GLUCOSE MONITORING SUPPL (FREESTYLE LITE) W/DEVICE KIT    Inject 1 each into the skin daily.   CANDESARTAN (ATACAND) 32 MG TABLET    Take 1 tablet (32 mg total) by mouth daily.   CARVEDILOL (COREG) 25 MG TABLET    Take 1 tablet (25 mg total) by mouth 2 (two) times daily.   CETIRIZINE (ZYRTEC) 10 MG TABLET    Take 1 tablet by mouth at bedtime for allergies.   CHOLECALCIFEROL (VITAMIN D) 50 MCG (2000 UT) TABLET    Take 4,000 Units by mouth daily.   EMPAGLIFLOZIN (JARDIANCE) 10 MG TABS TABLET    Take 1 tablet (10 mg total) by mouth daily before breakfast.  FAMOTIDINE (PEPCID) 20 MG TABLET    TAKE 1 OR 2 TABLETS BY MOUTH AT BEDTIME FOR HOARSE VOICE/REFLUX   FLOVENT HFA 110 MCG/ACT INHALER    TAKE 1 PUFF BY MOUTH TWICE A DAY   GABAPENTIN (NEURONTIN) 300 MG CAPSULE    Take 1 capsule (300 mg total) by mouth 3 (three) times daily.   GLIPIZIDE (GLUCOTROL) 5 MG TABLET    TAKE 1 TABLET (5 MG TOTAL) BY MOUTH 2 (TWO) TIMES DAILY BEFORE A MEAL. FOR DIABETES.   GLUCOSE BLOOD (ONETOUCH VERIO) TEST STRIP    USE AS INSTRUCTED TO TEST BLOOD SUGAR DAILY   LANCETS (ONETOUCH ULTRASOFT) LANCETS    Use as instructed to test blood sugar daily   LEVOTHYROXINE (SYNTHROID) 112 MCG TABLET    TAKE 1 TABLET BY MOUTH DAILY BEFORE  BREAKFAST.   ONDANSETRON (ZOFRAN ODT) 4 MG DISINTEGRATING TABLET    Take 1 tablet (4 mg total) by mouth every 8 (eight) hours as needed for nausea or vomiting.   POTASSIUM CHLORIDE SA (KLOR-CON) 20 MEQ TABLET    Take 1 tablet (20 mEq total) by mouth 2 (two) times daily. For low potassium.   ROSUVASTATIN (CRESTOR) 10 MG TABLET    TAKE 1 TABLET BY MOUTH EVERY DAY FOR CHOLESTEROL   SERTRALINE (ZOLOFT) 50 MG TABLET    TAKE 1 TABLET (50 MG TOTAL) BY MOUTH DAILY. FOR ANXIETY AND DEPRESSION.   SPIRONOLACTONE (ALDACTONE) 25 MG TABLET    Take 1 tablet (25 mg total) by mouth daily.   TAMOXIFEN (NOLVADEX) 20 MG TABLET    TAKE 1 TABLET BY MOUTH EVERY DAY   TRULICITY 9.62 EZ/6.6QH SOPN    INJECT CONTENTS OF 1 PEN (0.$Remove'75MG'eRjasCV$ ) INTO THE SKIN ONCE WEEKLY FOR DIABETES  Modified Medications   Modified Medication Previous Medication   DOLUTEGRAVIR ([name]) 50 MG TABLET dolutegravir ([name]) 50 MG tablet      Take 1 tablet (50 mg total) by mouth daily.    Take 50 mg by mouth daily.   EMTRICITABINE-TENOFOVIR AF (DESCOVY) 200-25 MG TABLET emtricitabine-tenofovir AF (DESCOVY) 200-25 MG tablet      Take 1 tablet by mouth daily.    Take 1 tablet by mouth daily.  Discontinued Medications   BICTEGRAVIR-EMTRICITABINE-TENOFOVIR AF (BIKTARVY) 50-200-25 MG TABS TABLET    Take 1 tablet by mouth daily.    Subjective: [name] is in for her routine HIV follow-up visit.  After her last visit here in December she called and said that she had a new insurance plan that did not cover Descovy.  We switched her to [employer].  She continued to take the supply of Descovy and [name] that she had leftover and only started Biktarvy 1 month ago.  She underwent an abdominal panniculectomy in early March and had problems with wound healing.  She underwent a second surgery in April to close the wound.  It is now finally healed.  She says that she has been much more depressed and often does not leave her room for 1 weeks at a time.  During the  time of her surgery she says that she frequently missed doses of her HIV medications.  She has been out of her doxycycline for 3 weeks which she was on chronically for her hidradenitis.  After switching over to [place] 1 month ago she started to feel worse.  She started taking her medications separately to see if she can figure out a cause.  She says that she has been very weak shaky and sweating.  She says it occurs after taking Biktarvy.  She says she is only missed 4 doses in the past month and on those days she felt better.  She tells me that she had her first 2 COVID vaccines last year but developed COVID last fall.  She says that she has been more short of breath since that time.    Review of Systems: Review of Systems  Constitutional:  Positive for diaphoresis and malaise/fatigue. Negative for chills and fever.  Respiratory:  Positive for shortness of breath. Negative for cough and sputum production.   Cardiovascular:  Negative for chest pain.  Gastrointestinal:  Negative for abdominal pain, diarrhea, nausea and vomiting.  Psychiatric/Behavioral:  Positive for depression. The patient is nervous/anxious.    Past Medical History:  Diagnosis Date   Allergy    Anemia    Normocytic   Anxiety    Asthma    Blood dyscrasia    Bronchitis 2005   CKD (chronic kidney disease)    CLASS 1-EXOPHTHALMOS-THYROTOXIC 02/08/2007   Diabetes mellitus without complication ([place])    Family history of breast cancer    Family history of lung cancer    Family history of prostate cancer    Gastroenteritis 07/10/07   GERD 07/24/2006   GRAVE'S DISEASE 01/01/2008   History of hidradenitis suppurativa    History of kidney stones    History of thrush    HIV DISEASE 07/24/2006   dx March 05   Hyperlipidemia    HYPERTENSION 07/24/2006   Hyperthyroidism 08/2006   Grave's Disease -diffuse radiotracer uptake 08/25/06 Thyroid scan-Cold nodule to R lower lobe of thyrorid   Menometrorrhagia    hx of    Nephrolithiasis    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   Personal history of chemotherapy    2020   Personal history of radiation therapy    2020   Pneumonia 2005   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   Sarcoidosis 02/08/2007   dx as a teenager in [place] from abnl CXR. Completed 2 yrs Prednisone after lung bx confirmation. No symptoms since then.   Suppurative hidradenitis    Thyroid cancer ([place])    THYROID NODULE, RIGHT 02/08/2007    Social History   Tobacco Use   Smoking status: Former    Packs/day: 0.50    Years: 15.00    Pack years: 7.50    Types: Cigarettes    Start date: 04/12/2017    Quit date: 2019    Years since quitting: 3.4   Smokeless tobacco: Never  Vaping Use   Vaping Use: Never used  Substance Use Topics   Alcohol use: Yes    Alcohol/week: 0.0 standard drinks    Comment: social   Drug use: No    Family History  Problem Relation Age of Onset   Hypertension Mother    Cancer Mother        laryngeal   Heart disease Mother        stent   Hypertension Father    Lung cancer Father 42       hx smoking   Heart disease Other    Hypertension Other    Stroke Other        Grandparent   Kidney disease Other        Grandparent   Diabetes Other        FH  of Diabetes   Hypertension Sister    Cancer Maternal Uncle        Lung CA   Hypertension Brother    Hypertension Sister    Breast cancer Maternal Aunt 74   Breast cancer Paternal Aunt 69   Prostate cancer Paternal Uncle    Breast cancer Maternal Aunt        dx 60+   Breast cancer Paternal Aunt        dx 66's   Breast cancer Paternal Aunt        dx 89's   Prostate cancer Paternal Uncle    Lung cancer Paternal Uncle    Breast cancer Cousin 58   Breast cancer Cousin        dx <50   Breast cancer Cousin         dx <50   Breast cancer Cousin        dx <50   Colon cancer Neg Hx    Esophageal cancer Neg Hx    Rectal cancer Neg Hx    Stomach cancer Neg Hx     Allergies  Allergen Reactions   Genvoya [Elviteg-Cobic-Emtricit-Tenofaf] Hives   Lisinopril Cough    Health Maintenance  Topic Date Due   Pneumococcal Vaccine 30-86 Years old (1 - PCV) Never done   Zoster Vaccines- Shingrix (1 of 2) Never done   OPHTHALMOLOGY EXAM  07/15/2016   FOOT EXAM  08/25/2019   TETANUS/TDAP  10/18/2019   HEMOGLOBIN A1C  03/29/2021   INFLUENZA VACCINE  05/17/2021   PAP SMEAR-Modifier  09/28/2021   MAMMOGRAM  07/06/2022   COLONOSCOPY (Pts 45-63yrs Insurance coverage will need to be confirmed)  11/16/2028   PNEUMOCOCCAL POLYSACCHARIDE VACCINE AGE 25-64 HIGH RISK  Completed   Hepatitis C Screening  Completed   HIV Screening  Completed   HPV VACCINES  Aged Out   COVID-19 Vaccine  Discontinued    Objective:  Vitals:   03/31/21 1106  BP: 132/66  Pulse: 78  Resp: 16  SpO2: 98%  Weight: 208 lb (94.3 kg)  Height: $Remove'5\' 5"'uCQrflX$  (1.651 m)   Body mass index is 34.61 kg/m.  Physical Exam Constitutional:      Comments: Her weight is down 29 pounds since her panniculectomy in March.  Cardiovascular:     Rate and Rhythm: Normal rate and regular rhythm.     Heart sounds: No murmur heard. Pulmonary:     Effort: Pulmonary effort is normal.     Breath sounds: Normal breath sounds.  Abdominal:     Palpations: Abdomen is soft.     Tenderness: There is no abdominal tenderness.     Comments: Her abdominal incisions have healed without evidence of active infection.  Skin:    Findings: No rash.  Psychiatric:     Comments: Flat affect.    Lab Results Lab Results  Component Value Date   WBC 6.0 03/17/2021   HGB 11.1 (L) 03/17/2021   HCT 34.8 (L) 03/17/2021   MCV 95.6 03/17/2021   PLT 251 03/17/2021    Lab Results  Component Value Date   CREATININE 2.74 (H) 03/17/2021   BUN 19 03/17/2021   NA 138 03/17/2021    K 4.4 03/17/2021   CL 109 03/17/2021   CO2 24 03/17/2021    Lab Results  Component Value Date   ALT 17 03/17/2021   AST 28 03/17/2021   ALKPHOS 71 01/11/2021   BILITOT 0.3 03/17/2021    Lab Results  Component Value Date  CHOL 133 03/17/2021   HDL 30 (L) 03/17/2021   LDLCALC 71 03/17/2021   LDLDIRECT 171.0 06/05/2019   TRIG 227 (H) 03/17/2021   CHOLHDL 4.4 03/17/2021   Lab Results  Component Value Date   LABRPR NON-REACTIVE 03/17/2021   HIV 1 RNA Quant  Date Value  03/17/2021 1,490 Copies/mL (H)  08/18/2020 49 Copies/mL (H)  11/20/2019 <20 DETECTED copies/mL (A)   CD4 T Cell Abs (/uL)  Date Value  03/17/2021 626  08/18/2020 624  11/20/2019 414     Problem List Items Addressed This Visit       Medium   HIV disease ([place])    Her viral load is up, most likely due to frequent missed doses in the past few months.  I will check resistance assays today.  She thinks she is having problems tolerating Biktarvy.  We checked on her current insurance does cover Descovy and [name].  I will switch her back now and see her back in 6 weeks.  I encouraged her to go ahead and get a COVID booster vaccine.       Relevant Medications   emtricitabine-tenofovir AF (DESCOVY) 200-25 MG tablet   dolutegravir ([name]) 50 MG tablet   Other Relevant Orders   HIV-1 genotypr plus   HIV-1 Integrase Genotype   Depression    Her chronic depression is worse.  I will schedule her to see our behavioral health counselor.       Hidradenitis suppurativa of right >> left axilla   Relevant Medications   emtricitabine-tenofovir AF (DESCOVY) 200-25 MG tablet   dolutegravir ([name]) 50 MG tablet   doxycycline (VIBRA-TABS) 100 MG tablet      Michel Bickers, MD [facility] [employer] [facility] [phone] pager   [phone] cell 03/31/2021, 11:57 AM

## 2021-03-31 NOTE — Assessment & Plan Note (Signed)
Her chronic depression is worse.  I will schedule her to see our behavioral health counselor.

## 2021-04-01 ENCOUNTER — Telehealth: Payer: Self-pay | Admitting: *Deleted

## 2021-04-01 ENCOUNTER — Telehealth: Payer: Self-pay

## 2021-04-01 DIAGNOSIS — B20 Human immunodeficiency virus [HIV] disease: Secondary | ICD-10-CM

## 2021-04-01 MED ORDER — DOLUTEGRAVIR SODIUM 50 MG PO TABS: 50.0000 mg | ORAL_TABLET | Freq: Every day | ORAL | 11 refills | Status: DC

## 2021-04-01 NOTE — Telephone Encounter (Signed)
Pt called to this RN to state concern due to increasing neuropathy since completing chemotherapy.  She states she is still continuing gabapentin.  She feels neuropathy is now interfering with her ambulation due to 2 falls.  She is unsure who she should see and since the neuropathy started during chemotherapy she called this office.  This RN scheduled her a follow up appt for better assessment and evaluation for further recommendations which may mean referring her to another MD.  [name] verbalized agreement to plan- appointment scheduled by this RN.

## 2021-04-01 NOTE — Telephone Encounter (Signed)
[name] failed to transmit to pharmacy electronically. Will resend.   Beryle Flock, RN

## 2021-04-07 NOTE — Progress Notes (Signed)
[place]  [phone] [phone]    ID: [name] DOB: 15-Apr-1966  MR#: [id number]  UYQ#:[id number]  Patient Care Team: [name], NP as PCP - General (Internal Medicine) Michel Bickers, MD as PCP - Infectious Diseases (Infectious Diseases) Nadeen Shipman, Virgie Dad, MD as Consulting Physician (Oncology) Kyung Rudd, MD as Consulting Physician (Radiation Oncology) Erroll Luna, MD as Consulting Physician (General Surgery) Alexis Frock, MD as Consulting Physician (Urology) Bensimhon, Shaune Pascal, MD as Consulting Physician (Cardiology) Elmarie Shiley, MD as Consulting Physician (Nephrology) OTHER MD:   CHIEF COMPLAINT: Triple positive breast cancer  CURRENT TREATMENT: tamoxifen   INTERVAL HISTORY: [name] returns today for follow-up of her triple positive breast cancer.    She continues on tamoxifen.  She has hot flashes and vaginal discharge associated with this.  Her most recent mammography was on 07/06/2020.   REVIEW OF SYSTEMS: [name] is having some neuropathic symptoms in the right hand.  She does not seem to have them in the left hand.  It affects the fingers and also the area between the thumb and index on the right.  She tells me that she has fallen 3x1 coming down from the bleachers from watching her niece's graduation, 1 at her mother's house and 1 at her own steps.  She says her hot flashes keep waking her up.  She says she has very poor memory and cannot retain anything.  She does not understand why her viral load has suddenly increased.  She says that she is taking the medicine as prescribed.  She continues to have boils although she is on doxycycline more or less chronically.  A detailed review of systems today was otherwise stable.   COVID 19 VACCINATION STATUS: [place] x2, most recently 01/2020   HISTORY OF CURRENT ILLNESS: From the original intake note:  [name] (pronounced "Huff") palpated a mass in  the right breast for about 2 weeks before bringing it to medical attention. She underwent bilateral diagnostic mammography with tomography and right breast ultrasonography at [facility] on 04/20/2018 showing: breast density category B. There was a highly suspicious mass located at 3 o'clock in the upper inner quadrant measuring 2.9 x 2.5 x 1.6 cm and 4 cm from the nipple. Ultrasound of the right axilla showed 5 lymph nodes with abnormal cortical thickening. The left axilla showed 3 lymph nodes with abnormal cortical thickening.  Accordingly on 04/26/2018 she proceeded to biopsy of the right breast area in question. The pathology from this procedure showed ([id number]): Invasive ductal carcinoma, grade II with calcifications. One right axillary lymph node and one left axillary lymph node were negative for carcinoma. Prognostic indicators significant for: estrogen receptor, 100% positive and progesterone receptor, 80% positive, both with strong staining intensity. Proliferation marker Ki67 at 70%. HER2 amplified with ratios HER2/CEP17 signals 4.52 and average HER2 copies per cell 10.85  The patient's subsequent history is as detailed below.   PAST MEDICAL HISTORY: Past Medical History:  Diagnosis Date   Allergy    Anemia    Normocytic   Anxiety    Asthma    Blood dyscrasia    Bronchitis 2005   CKD (chronic kidney disease)    CLASS 1-EXOPHTHALMOS-THYROTOXIC 02/08/2007   Diabetes mellitus without complication ([name])    Family history of breast cancer    Family history of lung cancer    Family history of prostate cancer    Gastroenteritis 07/10/07   GERD 07/24/2006   GRAVE'S DISEASE 01/01/2008  History of hidradenitis suppurativa    History of kidney stones    History of thrush    HIV DISEASE 07/24/2006   dx March 05   Hyperlipidemia    HYPERTENSION 07/24/2006   Hyperthyroidism 08/2006   Grave's Disease -diffuse radiotracer uptake 08/25/06 Thyroid scan-Cold nodule to R lower lobe of  thyrorid   Menometrorrhagia    hx of   Nephrolithiasis    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 Completed 2 yrs Prednisone after lung bx confirmation. No symptoms since then.   Suppurative hidradenitis    Thyroid cancer ([place])    THYROID NODULE, RIGHT 02/08/2007  -2018 thyroid nodules were precancerous but pathology was negative for thyroid cancer.    PAST SURGICAL HISTORY: Past Surgical History:  Procedure Laterality Date   APPLICATION OF WOUND VAC N/A 01/20/2021   Procedure: APPLICATION OF WOUND VAC;  Surgeon: Wallace Going, DO;  Location: [facility];  Service: Plastics;  Laterality: N/A;   BREAST EXCISIONAL BIOPSY Right 04/26/2018   right axilla negative   BREAST EXCISIONAL BIOPSY Left 04/26/2018   left axilla negative   BREAST LUMPECTOMY Right 10/03/2018   malignant   BREAST LUMPECTOMY WITH RADIOACTIVE SEED AND SENTINEL LYMPH NODE BIOPSY Right 10/03/2018   Procedure: RIGHT BREAST LUMPECTOMY WITH RADIOACTIVE SEED AND SENTINEL LYMPH NODE MAPPING;  Surgeon: Erroll Luna, MD;  Location: [facility];  Service: General;  Laterality: Right;   BREAST SURGERY  1997   Breast Reduction    CYSTOSCOPY W/ URETERAL STENT REMOVAL  11/09/2012   Procedure: CYSTOSCOPY WITH STENT REMOVAL;  Surgeon: Alexis Frock, MD;  Location: [facility];  Service: Urology;  Laterality: Right;   CYSTOSCOPY WITH RETROGRADE PYELOGRAM, URETEROSCOPY AND STENT PLACEMENT  11/09/2012   Procedure: CYSTOSCOPY WITH RETROGRADE PYELOGRAM, URETEROSCOPY AND STENT PLACEMENT;  Surgeon: Alexis Frock, MD;  [facility];  Service: Urology;  Laterality: Left;  LEFT URETEROSCOPY, STONE MANIPULATION, left STENT exchange    CYSTOSCOPY WITH STENT PLACEMENT  10/02/2012   Procedure: CYSTOSCOPY WITH STENT PLACEMENT;  Surgeon: Alexis Frock, MD;  [facility];  Service: Urology;  Laterality: Left;   DEBRIDEMENT AND CLOSURE WOUND N/A 01/20/2021   Procedure: Excision of abdominal wound with closure;  Surgeon: Wallace Going, DO;  Location: [facility];  Service: Plastics;  Laterality: N/A;   DILATION AND CURETTAGE OF UTERUS  Feb 2004   s/p for 1st trimester nonviable pregnancy   EYE SURGERY     sty under eyelid   INCISE AND DRAIN ABCESS  Nov 03   s/p I &D for righ inframmary fold hidradenitis   INCISION AND DRAINAGE PERITONSILLAR ABSCESS  Mar 03   IR CV LINE INJECTION  06/07/2018   IR IMAGING GUIDED PORT INSERTION  06/20/2018   IR REMOVAL TUN ACCESS W/ PORT W/O FL MOD SED  06/20/2018   IRRIGATION AND DEBRIDEMENT ABSCESS  01/31/2012   Procedure: IRRIGATION AND DEBRIDEMENT ABSCESS;  Surgeon: Shann Medal, MD;  [facility];  Service: General;  Laterality: Right;  right breast and axilla    NEPHROLITHOTOMY  10/02/2012  Procedure: NEPHROLITHOTOMY PERCUTANEOUS;  Surgeon: Alexis Frock, MD;  [facility];  Service: Urology;  Laterality: Right;  First Stage Percutaneous Nephrolithotomy with Surgeon Access, Left Ureteral Stent     NEPHROLITHOTOMY  10/04/2012   Procedure: NEPHROLITHOTOMY PERCUTANEOUS SECOND LOOK;  Surgeon: Alexis Frock, MD;  [facility];  Service: Urology;  Laterality: Right;      NEPHROLITHOTOMY  10/08/2012   Procedure: NEPHROLITHOTOMY PERCUTANEOUS;  Surgeon: Alexis Frock, MD;  [facility];  Service: Urology;  Laterality: Right;  THIRD STAGE, nephrostomy tube exchange x 2   NEPHROLITHOTOMY  10/11/2012   Procedure: NEPHROLITHOTOMY PERCUTANEOUS SECOND LOOK;  Surgeon: Alexis Frock, MD;  [facility];  Service: Urology;  Laterality: Right;  RIGHT 4 STAGE  PERCUTANOUS NEPHROLITHOTOMY, right URETEROSCOPY WITH HOLMIUM LASER    PANNICULECTOMY N/A 12/21/2020   Procedure: PANNICULECTOMY;  Surgeon: Wallace Going, DO;  Location: [facility];  Service: Plastics;  Laterality: N/A;   PORT-A-CATH REMOVAL N/A 07/16/2020   Procedure: REMOVAL PORT-A-CATH;  Surgeon: Erroll Luna, MD;  Location: [facility];  Service: General;  Laterality: N/A;   PORTACATH PLACEMENT Left 05/17/2018   Procedure: INSERTION PORT-A-CATH;  Surgeon: Coralie Keens, MD;  Location: [facility];  Service: General;  Laterality: Left;   RADICAL NECK DISSECTION  04/12/2017   limited/notes 04/12/2017   RADICAL NECK DISSECTION N/A 04/12/2017   Procedure: RADICAL NECK DISSECTION;  Surgeon: Melida Quitter, MD;  Location: [facility];  Service: ENT;  Laterality: N/A;  limited neck dissection 2 hours total   REDUCTION MAMMAPLASTY Bilateral 1998   RIGHT/LEFT HEART CATH AND CORONARY ANGIOGRAPHY N/A 03/12/2020   Procedure: RIGHT/LEFT HEART CATH AND CORONARY ANGIOGRAPHY;  Surgeon: Jolaine Artist, MD;  Location: [facility];  Service: Cardiovascular;  Laterality: N/A;   [place]  1994   THYROIDECTOMY  04/12/2017   completion/notes 04/12/2017   THYROIDECTOMY N/A 04/12/2017   Procedure: THYROIDECTOMY;  Surgeon: Melida Quitter, MD;  Location: [facility];  Service: ENT;  Laterality: N/A;  Completion Thyroidectomy   TOTAL THYROIDECTOMY  2010  Thyroid nodules removed- pathology at the ENT doctor 2018 were benign -Over active thyroid and thyroidectomy with benign pathology   FAMILY HISTORY Family History  Problem Relation Age of Onset   Hypertension Mother    Cancer Mother        laryngeal   Heart disease Mother        stent   Hypertension Father    Lung cancer Father 25       hx smoking   Heart disease Other    Hypertension Other    Stroke Other        Grandparent   Kidney disease Other        Grandparent   Diabetes Other        FH of Diabetes   Hypertension Sister     Cancer Maternal Uncle        Lung CA   Hypertension Brother    Hypertension Sister    Breast cancer Maternal Aunt 31   Breast cancer Paternal Aunt 71   Prostate cancer Paternal Uncle    Breast cancer Maternal Aunt        dx 60+   Breast cancer Paternal Aunt        dx 53's   Breast cancer Paternal Aunt        dx 35's   Prostate cancer Paternal Uncle    Lung cancer Paternal Uncle    Breast cancer Cousin 52  Breast cancer Cousin        dx <50   Breast cancer Cousin        dx <50   Breast cancer Cousin        dx <50   Colon cancer Neg Hx    Esophageal cancer Neg Hx    Rectal cancer Neg Hx    Stomach cancer Neg Hx   The patient's mother is alive at age 82. The patient's father died at 89 from lung cancer (heavy smoker). She does not know much about her father. The patient has 1 brother and 2 sisters. The patient's mother had a history of laryngeal cancer. There was a maternal cousin with breast cancer diagnosed at age 55. There were 2 maternal aunts with breast cancer, the youngest being diagnosed in the 45's. There was a maternal great aunt with breast cancer. There were 2 paternal aunts with breast cancer.    GYNECOLOGIC HISTORY:  Patient's last menstrual period was 03/31/2014 (lmp unknown). Menarche: 55 years old Age at first live birth: 55 years old She is GX P1.  Her LMP was in 2017. She did not use HRT.    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 She is single. At home is the patient's daughter, Verdie Drown, age 51, is a Pharmacist, hospital and also works in the summers and Therapist, art.  The patient attends [employer].   ADVANCED DIRECTIVES: Not in place; at the 05/10/2018 visit the patient was given the appropriate documents to complete and notarized at her discretion   HEALTH MAINTENANCE: Social History   Tobacco Use   Smoking status: Former    Packs/day: 0.50    Years: 15.00    Pack years:  7.50    Types: Cigarettes    Start date: 04/12/2017    Quit date: 2019    Years since quitting: 3.4   Smokeless tobacco: Never  Vaping Use   Vaping Use: Never used  Substance Use Topics   Alcohol use: Yes    Alcohol/week: 0.0 standard drinks    Comment: social   Drug use: No     Colonoscopy: no  PAP: 09/2020, negative  Bone density: remote   Allergies  Allergen Reactions   Genvoya [Elviteg-Cobic-Emtricit-Tenofaf] Hives   Lisinopril Cough    Current Outpatient Medications  Medication Sig Dispense Refill   acetaminophen (TYLENOL) 500 MG tablet Take 1 tablet (500 mg total) by mouth every 6 (six) hours as needed. For use AFTER surgery (Patient taking differently: Take 500 mg by mouth every 6 (six) hours as needed for moderate pain. For use AFTER surgery) 30 tablet 0   albuterol (VENTOLIN HFA) 108 (90 Base) MCG/ACT inhaler Inhale 2 puffs into the lungs every 6 (six) hours as needed for wheezing or shortness of breath. 8 g 2   allopurinol (ZYLOPRIM) 100 MG tablet TAKE 1 TABLET (100 MG TOTAL) BY MOUTH DAILY. FOR GOUT PREVENTION. 90 tablet 1   benzonatate (TESSALON) 100 MG capsule Take 1-2 capsules (100-200 mg total) by mouth every 8 (eight) hours. 21 capsule 0   Blood Glucose Monitoring Suppl (FREESTYLE LITE) w/Device KIT Inject 1 each into the skin daily. 1 kit 0   candesartan (ATACAND) 32 MG tablet Take 1 tablet (32 mg total) by mouth daily. 30 tablet 11   carvedilol (COREG) 25 MG tablet Take 1 tablet (25 mg total) by mouth 2 (two) times daily. 180 tablet 3   cetirizine (ZYRTEC) 10  MG tablet Take 1 tablet by mouth at bedtime for allergies. 90 tablet 0   Cholecalciferol (VITAMIN D) 50 MCG (2000 UT) tablet Take 4,000 Units by mouth daily.     dolutegravir ([name]) 50 MG tablet Take 1 tablet (50 mg total) by mouth daily. 30 tablet 11   doxycycline (VIBRA-TABS) 100 MG tablet Take 1 tablet (100 mg total) by mouth 2 (two) times daily. 60 tablet 5   empagliflozin (JARDIANCE) 10 MG TABS  tablet Take 1 tablet (10 mg total) by mouth daily before breakfast. (Patient not taking: Reported on 03/31/2021) 30 tablet 6   emtricitabine-tenofovir AF (DESCOVY) 200-25 MG tablet Take 1 tablet by mouth daily. 30 tablet 11   famotidine (PEPCID) 20 MG tablet TAKE 1 OR 2 TABLETS BY MOUTH AT BEDTIME FOR HOARSE VOICE/REFLUX (Patient not taking: Reported on 03/31/2021) 180 tablet 1   FLOVENT HFA 110 MCG/ACT inhaler TAKE 1 PUFF BY MOUTH TWICE A DAY (Patient not taking: Reported on 03/31/2021) 36 Inhaler 1   gabapentin (NEURONTIN) 300 MG capsule Take 1 capsule (300 mg total) by mouth 3 (three) times daily. (Patient taking differently: Take 300 mg by mouth daily.) 90 capsule 3   glipiZIDE (GLUCOTROL) 5 MG tablet TAKE 1 TABLET (5 MG TOTAL) BY MOUTH 2 (TWO) TIMES DAILY BEFORE A MEAL. FOR DIABETES. (Patient taking differently: Take 5 mg by mouth 2 (two) times daily before a meal.) 180 tablet 1   glucose blood (ONETOUCH VERIO) test strip USE AS INSTRUCTED TO TEST BLOOD SUGAR DAILY 100 each 2   Lancets (ONETOUCH ULTRASOFT) lancets Use as instructed to test blood sugar daily 100 each 5   levothyroxine (SYNTHROID) 112 MCG tablet TAKE 1 TABLET BY MOUTH DAILY BEFORE BREAKFAST. (Patient taking differently: Take 112 mcg by mouth daily before breakfast.) 30 tablet 5   ondansetron (ZOFRAN ODT) 4 MG disintegrating tablet Take 1 tablet (4 mg total) by mouth every 8 (eight) hours as needed for nausea or vomiting. 20 tablet 0   potassium chloride SA (KLOR-CON) 20 MEQ tablet Take 1 tablet (20 mEq total) by mouth 2 (two) times daily. For low potassium. 10 tablet 0   rosuvastatin (CRESTOR) 10 MG tablet TAKE 1 TABLET BY MOUTH EVERY DAY FOR CHOLESTEROL (Patient taking differently: Take 10 mg by mouth daily.) 90 tablet 3   sertraline (ZOLOFT) 50 MG tablet TAKE 1 TABLET (50 MG TOTAL) BY MOUTH DAILY. FOR ANXIETY AND DEPRESSION. (Patient not taking: Reported on 03/31/2021) 90 tablet 1   spironolactone (ALDACTONE) 25 MG tablet Take 1 tablet  (25 mg total) by mouth daily. 90 tablet 3   tamoxifen (NOLVADEX) 20 MG tablet TAKE 1 TABLET BY MOUTH EVERY DAY (Patient taking differently: Take 20 mg by mouth daily.) 90 tablet 4   TRULICITY 3.61 QA/4.4LP SOPN INJECT CONTENTS OF 1 PEN (0.$Remove'75MG'VEZmyWD$ ) INTO THE SKIN ONCE WEEKLY FOR DIABETES (Patient taking differently: Inject 0.75 mg as directed every Sunday.) 2 mL 3   No current facility-administered medications for this visit.   Facility-Administered Medications Ordered in Other Visits  Medication Dose Route Frequency Provider Last Rate Last Admin   sodium chloride flush (NS) 0.9 % injection 10 mL  10 mL Intracatheter PRN Jeremy Ditullio, Virgie Dad, MD   10 mL at 09/19/18 1551    OBJECTIVE: African-American woman in no acute distress  Vitals:   04/08/21 1558  BP: 139/78  Pulse: 79  Resp: 18  Temp: 98.6 F (37 C)  SpO2: 100%   Wt Readings from Last 3 Encounters:  04/08/21 225  lb 8 oz (102.3 kg)  03/31/21 208 lb (94.3 kg)  03/08/21 208 lb (94.3 kg)   Body mass index is 37.53 kg/m.    ECOG FS:1 - Symptomatic but completely ambulatory  Sclerae unicteric, EOMs intact Wearing a mask No cervical or supraclavicular adenopathy Lungs no rales or rhonchi Heart regular rate and rhythm Abd soft, obese, nontender, positive bowel sounds MSK no focal spinal tenderness, no upper extremity lymphedema Neuro: nonfocal, well oriented, appropriate affect Breasts: The right breast is status post lumpectomy and radiation with no evidence of local recurrence.  There is a bandaged over boil in the inferior medial aspect of the breast.  Both axillae show hidradenitis scars.  LAB RESULTS:  CMP     Component Value Date/Time   NA 138 03/17/2021 1050   NA 143 06/26/2020 0000   K 4.4 03/17/2021 1050   CL 109 03/17/2021 1050   CO2 24 03/17/2021 1050   GLUCOSE 147 (H) 03/17/2021 1050   BUN 19 03/17/2021 1050   BUN 25 (A) 06/26/2020 0000   CREATININE 2.74 (H) 03/17/2021 1050   CALCIUM 8.8 03/17/2021 1050    PROT 7.4 03/17/2021 1050   ALBUMIN 3.2 (L) 01/11/2021 0936   AST 28 03/17/2021 1050   AST 24 08/12/2019 1447   ALT 17 03/17/2021 1050   ALT 16 08/12/2019 1447   ALKPHOS 71 01/11/2021 0936   BILITOT 0.3 03/17/2021 1050   BILITOT 0.2 (L) 08/12/2019 1447   GFRNONAA 28 (L) 01/19/2021 1507   GFRNONAA 29 (L) 08/12/2019 1447   GFRAA 39 (L) 07/14/2020 1053   GFRAA 33 (L) 08/12/2019 1447    No results found for: TOTALPROTELP, ALBUMINELP, A1GS, A2GS, BETS, BETA2SER, GAMS, MSPIKE, SPEI  No results found for: KPAFRELGTCHN, LAMBDASER, KAPLAMBRATIO  Lab Results  Component Value Date   WBC 6.0 03/17/2021   NEUTROABS 4.8 01/11/2021   HGB 11.1 (L) 03/17/2021   HCT 34.8 (L) 03/17/2021   MCV 95.6 03/17/2021   PLT 251 03/17/2021   No results found for: LABCA2  No components found for: OZDGUY403  No results for input(s): INR in the last 168 hours.  No results found for: LABCA2  No results found for: KVQ259  No results found for: DGL875  No results found for: IEP329  No results found for: CA2729  No components found for: HGQUANT  No results found for: CEA1 / No results found for: CEA1   No results found for: AFPTUMOR  No results found for: CHROMOGRNA  No results found for: HGBA, HGBA2QUANT, HGBFQUANT, HGBSQUAN (Hemoglobinopathy evaluation)   No results found for: LDH  Lab Results  Component Value Date   IRON 26 (L) 02/08/2007   TIBC 352 06/26/2020   IRONPCTSAT 15 06/26/2020   (Iron and TIBC)  Lab Results  Component Value Date   FERRITIN 141 06/26/2020    Urinalysis    Component Value Date/Time   COLORURINE RED (A) 12/10/2019 1145   APPEARANCEUR TURBID (A) 12/10/2019 1145   LABSPEC 1.017 12/10/2019 1145   PHURINE < OR = 5.0 12/10/2019 1145   GLUCOSEU NEGATIVE 12/10/2019 1145   GLUCOSEU NEG mg/dL 01/23/2008 1418   HGBUR 2+ (A) 12/10/2019 1145   BILIRUBINUR negative 03/09/2021 0909   BILIRUBINUR neg 09/28/2020 1106   KETONESUR negative 03/09/2021 0909    KETONESUR NEGATIVE 12/10/2019 1145   PROTEINUR >=300 (A) 03/09/2021 0909   PROTEINUR Positive (A) 09/28/2020 1106   PROTEINUR 3+ (A) 12/10/2019 1145   UROBILINOGEN 0.2 03/09/2021 0909   UROBILINOGEN 0.2 10/21/2014 2347  NITRITE Negative 03/09/2021 0909   NITRITE neg 09/28/2020 1106   NITRITE POSITIVE (A) 12/10/2019 1145   LEUKOCYTESUR Large (3+) (A) 03/09/2021 0909   LEUKOCYTESUR 3+ (A) 12/10/2019 1145    STUDIES: No results found.   ELIGIBLE FOR AVAILABLE RESEARCH PROTOCOL: declined DCP study  ASSESSMENT: 55 y.o. [place], [place] woman status post right breast upper inner quadrant biopsy 04/26/2018 for a clinical T2 N0, stage IB invasive ductal carcinoma, grade 2, ER/PR positive, HER-2 positive with an MIB-1 of 70%.  (1) genetics testing 07/03/2018: no pathogenic mutations. Genes Analyzed (43 total): APC*, ATM*, AXIN2, BARD1, BMPR1A, BRCA1*, BRCA2*, BRIP1*, CDH1*, CDK4, CDKN2A, CHEK2*, DICER1,GALNT12, HOXB13, MEN1, MLH1*, MRE11A, MSH2*, MSH3, MSH6*, MUTYH*, NBN, NF1*, NTHL1, PALB2*, PMS2*, POLD1, POLE,PTEN*, RAD50, RAD51C*, RAD51D*, RET, SDHB, SDHD, SMAD4, SMARCA4, STK11 and TP53* (sequencing and deletion/duplication); EGFR (sequencing only); EPCAM and GREM1 (deletion/duplication only). DNA and RNA analyses performed for * genes.  (2) neoadjuvant chemotherapy consisting of carboplatin, docetaxel, trastuzumab and pertuzumab given every 21 days x 6, starting 05/31/2018, completed 09/14/2018  (a) Pertuzumab discontinued starting with cycle 2 because of diarrhea problems  (b) Taxotere stopped after 4 cycles due peripheral neuropathy and substituted for last two cycles with gemcitabine  (3)  definitive surgery on 10/03/2018 showed residual ypT1c ypN0 invasive ductal carcinoma, grade 2, with negative margins; a total of 5 sentinel lymph nodes removed; repeat prognostic panel again triple positive  (4) T-DM1 given adjuvantly due to residual disease: started 11/13/2018, completed  11/04/2019.  (a) echo on 05/16/2018 shows an EF of 60-65%.  (b) echo on 08/27/18 shows an EF 55-60%  (c) echo on 12/03/2018 shows an EF of 60-65%  (d) echocardiogram on 03/06/2019 showed an ejection fraction of 55% as read by Dr. Haroldine Laws.  (e) echocardiogram 06/20/2019 shows an ejection fraction in the 55-60% range  (f) echo 02/20/2020 shows an ejection fraction in the 60-65% range.  (5) adjuvant radiation completed 01/07/2019 - 02/21/2019 (delayed due to wound healing issues)   Site/dose:   The patient initially received a dose of 50.4 Gy in 28 fractions to the right breast using whole-breast tangent fields. This was delivered using a 3-D conformal technique. The patient then received a boost to the seroma. This delivered an additional 10 Gy in 5 fractions using 12e electrons with a special teletherapy technique. The total dose was 60.4 Gy.  (6) tamoxifen started 02/28/2019  (7) hidradenitis/boils: on doxycycline    PLAN: Lorelle is now 2-1/2 years out from definitive surgery for her breast cancer with no evidence of disease recurrence.  This is very favorable.  She is tolerating tamoxifen moderately well, except for the hot flashes.  I do not think the symptoms she has in her right hand are going to be due to tamoxifen, which generally does not cause arthralgias.  I think she has carpal tunnel there and I suggested she get a wrist splint and make sure she uses it every night and as much as possible during the day.  I would expect she will find some relief after a couple of weeks.  She will have a repeat mammography in September and see me again in October  She knows to call for any other issue that may develop before the next visit  Total encounter time 20 minutes.Sarajane Jews C. Wavie Hashimi, MD  04/08/21 4:18 PM Medical Oncology and Hematology [facility] [place], [place] [address] Tel. [phone]    Fax. [phone]   I, Wilburn Mylar, am acting as  scribe for Dr. Sarajane Jews C. Vila Dory.  I, Lurline Del MD, have reviewed the above documentation for accuracy and completeness, and I agree with the above.   *Total Encounter Time as defined by the Centers for Medicare and Medicaid Services includes, in addition to the face-to-face time of a patient visit (documented in the note above) non-face-to-face time: obtaining and reviewing outside history, ordering and reviewing medications, tests or procedures, care coordination (communications with other health care professionals or caregivers) and documentation in the medical record.

## 2021-04-08 ENCOUNTER — Other Ambulatory Visit: Payer: Self-pay

## 2021-04-08 ENCOUNTER — Inpatient Hospital Stay: Payer: 59 | Attending: Oncology | Admitting: Oncology

## 2021-04-08 VITALS — BP 139/78 | HR 79 | Temp 98.6°F | Resp 18 | Ht 65.0 in | Wt 225.5 lb

## 2021-04-08 DIAGNOSIS — L732 Hidradenitis suppurativa: Secondary | ICD-10-CM | POA: Insufficient documentation

## 2021-04-08 DIAGNOSIS — Z170 Estrogen receptor positive status [ER+]: Secondary | ICD-10-CM | POA: Insufficient documentation

## 2021-04-08 DIAGNOSIS — L0292 Furuncle, unspecified: Secondary | ICD-10-CM | POA: Diagnosis not present

## 2021-04-08 DIAGNOSIS — N898 Other specified noninflammatory disorders of vagina: Secondary | ICD-10-CM | POA: Insufficient documentation

## 2021-04-08 DIAGNOSIS — N951 Menopausal and female climacteric states: Secondary | ICD-10-CM | POA: Insufficient documentation

## 2021-04-08 DIAGNOSIS — Z923 Personal history of irradiation: Secondary | ICD-10-CM | POA: Insufficient documentation

## 2021-04-08 DIAGNOSIS — Z803 Family history of malignant neoplasm of breast: Secondary | ICD-10-CM | POA: Insufficient documentation

## 2021-04-08 DIAGNOSIS — D869 Sarcoidosis, unspecified: Secondary | ICD-10-CM | POA: Diagnosis not present

## 2021-04-08 DIAGNOSIS — C50211 Malignant neoplasm of upper-inner quadrant of right female breast: Secondary | ICD-10-CM | POA: Diagnosis present

## 2021-04-08 DIAGNOSIS — C50311 Malignant neoplasm of lower-inner quadrant of right female breast: Secondary | ICD-10-CM

## 2021-04-08 DIAGNOSIS — Z9221 Personal history of antineoplastic chemotherapy: Secondary | ICD-10-CM | POA: Insufficient documentation

## 2021-04-08 DIAGNOSIS — Z87891 Personal history of nicotine dependence: Secondary | ICD-10-CM | POA: Diagnosis not present

## 2021-04-08 DIAGNOSIS — Z808 Family history of malignant neoplasm of other organs or systems: Secondary | ICD-10-CM | POA: Diagnosis not present

## 2021-04-08 DIAGNOSIS — Z801 Family history of malignant neoplasm of trachea, bronchus and lung: Secondary | ICD-10-CM | POA: Insufficient documentation

## 2021-04-12 LAB — HIV-1 INTEGRASE GENOTYPE

## 2021-04-12 LAB — HIV-1 GENOTYPE: HIV-1 Genotype: DETECTED — AB

## 2021-04-13 ENCOUNTER — Other Ambulatory Visit: Payer: Self-pay

## 2021-04-13 ENCOUNTER — Ambulatory Visit: Payer: 59

## 2021-04-14 ENCOUNTER — Other Ambulatory Visit: Payer: Self-pay | Admitting: Primary Care

## 2021-04-14 DIAGNOSIS — F331 Major depressive disorder, recurrent, moderate: Secondary | ICD-10-CM

## 2021-04-14 DIAGNOSIS — F411 Generalized anxiety disorder: Secondary | ICD-10-CM

## 2021-04-22 ENCOUNTER — Other Ambulatory Visit: Payer: Self-pay | Admitting: Primary Care

## 2021-04-22 DIAGNOSIS — E119 Type 2 diabetes mellitus without complications: Secondary | ICD-10-CM

## 2021-04-22 NOTE — Telephone Encounter (Signed)
Patient has not had a diabetes follow up in a long time. Needs to be scheduled ASAP. Put at the end of a session.

## 2021-04-27 ENCOUNTER — Ambulatory Visit: Payer: 59

## 2021-04-27 ENCOUNTER — Other Ambulatory Visit: Payer: Self-pay

## 2021-04-30 ENCOUNTER — Encounter: Payer: Self-pay | Admitting: Internal Medicine

## 2021-04-30 ENCOUNTER — Other Ambulatory Visit: Payer: Self-pay

## 2021-04-30 ENCOUNTER — Ambulatory Visit (INDEPENDENT_AMBULATORY_CARE_PROVIDER_SITE_OTHER): Payer: 59 | Admitting: Internal Medicine

## 2021-04-30 VITALS — BP 132/90 | HR 80 | Temp 98.2°F | Ht 65.0 in | Wt 223.0 lb

## 2021-04-30 DIAGNOSIS — E890 Postprocedural hypothyroidism: Secondary | ICD-10-CM

## 2021-04-30 DIAGNOSIS — N390 Urinary tract infection, site not specified: Secondary | ICD-10-CM

## 2021-04-30 DIAGNOSIS — R350 Frequency of micturition: Secondary | ICD-10-CM

## 2021-04-30 LAB — POC URINALSYSI DIPSTICK (AUTOMATED)
Bilirubin, UA: NEGATIVE
Glucose, UA: NEGATIVE
Ketones, UA: NEGATIVE
Nitrite, UA: NEGATIVE
Protein, UA: POSITIVE — AB
Spec Grav, UA: 1.02 (ref 1.010–1.025)
Urobilinogen, UA: 0.2 E.U./dL
pH, UA: 6 (ref 5.0–8.0)

## 2021-04-30 MED ORDER — CEPHALEXIN 500 MG PO CAPS: 500.0000 mg | ORAL_CAPSULE | Freq: Three times a day (TID) | ORAL | 0 refills | Status: DC

## 2021-04-30 NOTE — Addendum Note (Signed)
Addended by: Ellamae Sia on: 04/30/2021 11:43 AM   Modules accepted: Orders

## 2021-04-30 NOTE — Progress Notes (Signed)
Subjective:    Patient ID: [name], female    DOB: 01-Apr-1966, 55 y.o.   MRN: [id number]  HPI Here due to urinary symptoms This visit occurred during the SARS-CoV-2 public health emergency.  Safety protocols were in place, including screening questions prior to the visit, additional usage of staff PPE, and extensive cleaning of exam room while observing appropriate contact time as indicated for disinfecting solutions.   Has noticed increased frequency since 5 days ago Thought it might be related to her BP med Pain in back started 2 days ago or so No dysuria or hematuria No fever  Had recent severe UTI--doesn't want to get back there  No periods now No sexual relationship  Current Outpatient Medications on File Prior to Visit  Medication Sig Dispense Refill   acetaminophen (TYLENOL) 500 MG tablet Take 1 tablet (500 mg total) by mouth every 6 (six) hours as needed. For use AFTER surgery (Patient taking differently: Take 500 mg by mouth every 6 (six) hours as needed for moderate pain. For use AFTER surgery) 30 tablet 0   albuterol (VENTOLIN HFA) 108 (90 Base) MCG/ACT inhaler Inhale 2 puffs into the lungs every 6 (six) hours as needed for wheezing or shortness of breath. 8 g 2   allopurinol (ZYLOPRIM) 100 MG tablet TAKE 1 TABLET (100 MG TOTAL) BY MOUTH DAILY. FOR GOUT PREVENTION. 90 tablet 1   Blood Glucose Monitoring Suppl (FREESTYLE LITE) w/Device KIT Inject 1 each into the skin daily. 1 kit 0   candesartan (ATACAND) 32 MG tablet Take 1 tablet (32 mg total) by mouth daily. 30 tablet 11   cetirizine (ZYRTEC) 10 MG tablet Take 1 tablet by mouth at bedtime for allergies. 90 tablet 0   Cholecalciferol (VITAMIN D) 50 MCG (2000 UT) tablet Take 4,000 Units by mouth daily.     dolutegravir ([name]) 50 MG tablet Take 1 tablet (50 mg total) by mouth daily. 30 tablet 11   doxycycline (VIBRA-TABS) 100 MG tablet Take 1 tablet (100 mg total) by mouth 2 (two) times daily. 60 tablet 5    Dulaglutide (TRULICITY) 0.93 OI/7.1IW SOPN INJECT CONTENTS OF 1 PEN INTO THE SKIN ONCE WEEKLY FOR DIABETES 2 mL 0   empagliflozin (JARDIANCE) 10 MG TABS tablet Take 1 tablet (10 mg total) by mouth daily before breakfast. 30 tablet 6   emtricitabine-tenofovir AF (DESCOVY) 200-25 MG tablet Take 1 tablet by mouth daily. 30 tablet 11   famotidine (PEPCID) 20 MG tablet TAKE 1 OR 2 TABLETS BY MOUTH AT BEDTIME FOR HOARSE VOICE/REFLUX 180 tablet 1   FLOVENT HFA 110 MCG/ACT inhaler TAKE 1 PUFF BY MOUTH TWICE A DAY 36 Inhaler 1   gabapentin (NEURONTIN) 300 MG capsule Take 1 capsule (300 mg total) by mouth 3 (three) times daily. (Patient taking differently: Take 300 mg by mouth daily.) 90 capsule 3   glipiZIDE (GLUCOTROL) 5 MG tablet TAKE 1 TABLET (5 MG TOTAL) BY MOUTH 2 (TWO) TIMES DAILY BEFORE A MEAL. FOR DIABETES. (Patient taking differently: Take 5 mg by mouth 2 (two) times daily before a meal.) 180 tablet 1   glucose blood (ONETOUCH VERIO) test strip USE AS INSTRUCTED TO TEST BLOOD SUGAR DAILY 100 each 2   Lancets (ONETOUCH ULTRASOFT) lancets Use as instructed to test blood sugar daily 100 each 5   levothyroxine (SYNTHROID) 112 MCG tablet TAKE 1 TABLET BY MOUTH DAILY BEFORE BREAKFAST. (Patient taking differently: Take 112 mcg by mouth daily before breakfast.) 30 tablet 5   ondansetron (ZOFRAN  ODT) 4 MG disintegrating tablet Take 1 tablet (4 mg total) by mouth every 8 (eight) hours as needed for nausea or vomiting. 20 tablet 0   rosuvastatin (CRESTOR) 10 MG tablet TAKE 1 TABLET BY MOUTH EVERY DAY FOR CHOLESTEROL (Patient taking differently: Take 10 mg by mouth daily.) 90 tablet 3   sertraline (ZOLOFT) 50 MG tablet TAKE 1 TABLET (50 MG TOTAL) BY MOUTH DAILY. FOR ANXIETY AND DEPRESSION. 90 tablet 1   spironolactone (ALDACTONE) 25 MG tablet Take 1 tablet (25 mg total) by mouth daily. 90 tablet 3   tamoxifen (NOLVADEX) 20 MG tablet TAKE 1 TABLET BY MOUTH EVERY DAY (Patient taking differently: Take 20 mg by mouth  daily.) 90 tablet 4   carvedilol (COREG) 25 MG tablet Take 1 tablet (25 mg total) by mouth 2 (two) times daily. 180 tablet 3   potassium chloride SA (KLOR-CON) 20 MEQ tablet Take 1 tablet (20 mEq total) by mouth 2 (two) times daily. For low potassium. (Patient not taking: Reported on 04/30/2021) 10 tablet 0   [DISCONTINUED] prochlorperazine (COMPAZINE) 10 MG tablet TAKE 1 TABLET BY MOUTH EVERY 6 HOURS AS NEEDED FOR NAUSEA OR VOMITING 30 tablet 2   Current Facility-Administered Medications on File Prior to Visit  Medication Dose Route Frequency Provider Last Rate Last Admin   sodium chloride flush (NS) 0.9 % injection 10 mL  10 mL Intracatheter PRN Magrinat, Virgie Dad, MD   10 mL at 09/19/18 1551    Allergies  Allergen Reactions   Genvoya [Elviteg-Cobic-Emtricit-Tenofaf] Hives   Lisinopril Cough    Past Medical History:  Diagnosis Date   Allergy    Anemia    Normocytic   Anxiety    Asthma    Blood dyscrasia    Bronchitis 2005   CKD (chronic kidney disease)    CLASS 1-EXOPHTHALMOS-THYROTOXIC 02/08/2007   Diabetes mellitus without complication ([place])    Family history of breast cancer    Family history of lung cancer    Family history of prostate cancer    Gastroenteritis 07/10/07   GERD 07/24/2006   GRAVE'S DISEASE 01/01/2008   History of hidradenitis suppurativa    History of kidney stones    History of thrush    HIV DISEASE 07/24/2006   dx March 05   Hyperlipidemia    HYPERTENSION 07/24/2006   Hyperthyroidism 08/2006   Grave's Disease -diffuse radiotracer uptake 08/25/06 Thyroid scan-Cold nodule to R lower lobe of thyrorid   Menometrorrhagia    hx of   Nephrolithiasis    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 Completed 2 yrs Prednisone after lung bx confirmation. No symptoms since then.   Suppurative hidradenitis    Thyroid cancer ([place])    THYROID NODULE, RIGHT 02/08/2007    Past Surgical History:  Procedure Laterality Date   APPLICATION OF WOUND VAC N/A 01/20/2021   Procedure: APPLICATION OF WOUND VAC;  Surgeon: Wallace Going, DO;  Location: [facility];  Service: Plastics;  Laterality: N/A;   BREAST EXCISIONAL BIOPSY Right 04/26/2018   right axilla negative   BREAST EXCISIONAL BIOPSY Left  04/26/2018   left axilla negative   BREAST LUMPECTOMY Right 10/03/2018   malignant   BREAST LUMPECTOMY WITH RADIOACTIVE SEED AND SENTINEL LYMPH NODE BIOPSY Right 10/03/2018   Procedure: RIGHT BREAST LUMPECTOMY WITH RADIOACTIVE SEED AND SENTINEL LYMPH NODE MAPPING;  Surgeon: Erroll Luna, MD;  Location: [facility];  Service: General;  Laterality: Right;   BREAST SURGERY  1997   Breast Reduction    CYSTOSCOPY W/ URETERAL STENT REMOVAL  11/09/2012   Procedure: CYSTOSCOPY WITH STENT REMOVAL;  Surgeon: Alexis Frock, MD;  Location: [facility];  Service: Urology;  Laterality: Right;   CYSTOSCOPY WITH RETROGRADE PYELOGRAM, URETEROSCOPY AND STENT PLACEMENT  11/09/2012   Procedure: CYSTOSCOPY WITH RETROGRADE PYELOGRAM, URETEROSCOPY AND STENT PLACEMENT;  Surgeon: Alexis Frock, MD;  Location: [facility];  Service: Urology;  Laterality: Left;  LEFT URETEROSCOPY, STONE MANIPULATION, left STENT exchange    CYSTOSCOPY WITH STENT PLACEMENT  10/02/2012   Procedure: CYSTOSCOPY WITH STENT PLACEMENT;  Surgeon: Alexis Frock, MD;  Location: [facility];  Service: Urology;  Laterality: Left;   DEBRIDEMENT AND CLOSURE WOUND N/A 01/20/2021   Procedure: Excision of abdominal wound with closure;  Surgeon: Wallace Going, DO;  Location: [facility];  Service: Plastics;   Laterality: N/A;   DILATION AND CURETTAGE OF UTERUS  Feb 2004   s/p for 1st trimester nonviable pregnancy   EYE SURGERY     sty under eyelid   INCISE AND DRAIN ABCESS  Nov 03   s/p I &D for righ inframmary fold hidradenitis   INCISION AND DRAINAGE PERITONSILLAR ABSCESS  Mar 03   IR CV LINE INJECTION  06/07/2018   IR IMAGING GUIDED PORT INSERTION  06/20/2018   IR REMOVAL TUN ACCESS W/ PORT W/O FL MOD SED  06/20/2018   IRRIGATION AND DEBRIDEMENT ABSCESS  01/31/2012   Procedure: IRRIGATION AND DEBRIDEMENT ABSCESS;  Surgeon: Shann Medal, MD;  Location: [facility];  Service: General;  Laterality: Right;  right breast and axilla    NEPHROLITHOTOMY  10/02/2012   Procedure: NEPHROLITHOTOMY PERCUTANEOUS;  Surgeon: Alexis Frock, MD;  Location[facility];  Service: Urology;  Laterality: Right;  First Stage Percutaneous Nephrolithotomy with Surgeon Access, Left Ureteral Stent     NEPHROLITHOTOMY  10/04/2012   Procedure: NEPHROLITHOTOMY PERCUTANEOUS SECOND LOOK;  Surgeon: Alexis Frock, MD;  Location[facility];  Service: Urology;  Laterality: Right;      NEPHROLITHOTOMY  10/08/2012   Procedure: NEPHROLITHOTOMY PERCUTANEOUS;  Surgeon: Alexis Frock, MD;  Location[facility];  Service: Urology;  Laterality: Right;  THIRD STAGE, nephrostomy tube exchange x 2   NEPHROLITHOTOMY  10/11/2012   Procedure: NEPHROLITHOTOMY PERCUTANEOUS SECOND LOOK;  Surgeon: Alexis Frock, MD;  Location[facility];  Service: Urology;  Laterality: Right;  RIGHT 4 STAGE PERCUTANOUS NEPHROLITHOTOMY, right URETEROSCOPY WITH HOLMIUM LASER    PANNICULECTOMY N/A 12/21/2020   Procedure: PANNICULECTOMY;  Surgeon: Wallace Going, DO;  Location: [facility];  Service: Plastics;  Laterality: N/A;   PORT-A-CATH REMOVAL N/A 07/16/2020   Procedure: REMOVAL PORT-A-CATH;  Surgeon: Erroll Luna, MD;  Location: [facility];  Service: General;  Laterality: N/A;   PORTACATH PLACEMENT Left 05/17/2018   Procedure: INSERTION PORT-A-CATH;  Surgeon:  Coralie Keens, MD;  Location: [facility];  Service: General;  Laterality: Left;   RADICAL NECK DISSECTION  04/12/2017   limited/notes 04/12/2017   RADICAL NECK DISSECTION N/A 04/12/2017   Procedure: RADICAL NECK DISSECTION;  Surgeon: Melida Quitter, MD;  Location: [facility];  Service: ENT;  Laterality: N/A;  limited neck dissection 2 hours total   REDUCTION MAMMAPLASTY Bilateral 1998   RIGHT/LEFT HEART CATH AND CORONARY ANGIOGRAPHY N/A 03/12/2020   Procedure: RIGHT/LEFT HEART CATH AND CORONARY ANGIOGRAPHY;  Surgeon: Jolaine Artist, MD;  Location: [facility];  Service: Cardiovascular;  Laterality: N/A;   [facility]  1994   THYROIDECTOMY  04/12/2017   completion/notes 04/12/2017   THYROIDECTOMY N/A 04/12/2017   Procedure: THYROIDECTOMY;  Surgeon: Melida Quitter, MD;  Location: [facility];  Service: ENT;  Laterality: N/A;  Completion Thyroidectomy   TOTAL THYROIDECTOMY  2010    Family History  Problem Relation Age of Onset   Hypertension Mother    Cancer Mother        laryngeal   Heart disease Mother        stent   Hypertension Father    Lung cancer Father 70       hx smoking   Heart disease Other    Hypertension Other    Stroke Other        Grandparent   Kidney disease Other        Grandparent   Diabetes Other        FH of Diabetes   Hypertension Sister    Cancer Maternal Uncle        Lung CA   Hypertension Brother    Hypertension Sister    Breast cancer Maternal Aunt 65   Breast cancer Paternal Aunt 34   Prostate cancer Paternal Uncle    Breast cancer Maternal Aunt        dx 23+   Breast cancer Paternal Aunt        dx 18's   Breast cancer Paternal 35        dx 50's   Prostate cancer Paternal Uncle    Lung cancer Paternal Uncle    Breast cancer Cousin 58   Breast cancer Cousin        dx <50   Breast cancer Cousin        dx <50   Breast cancer Cousin        dx <50   Colon cancer Neg Hx    Esophageal cancer Neg Hx    Rectal cancer Neg Hx     Stomach cancer Neg Hx     Social History   Socioeconomic History   Marital status: Single    Spouse name: Not on file   Number of children: 1   Years of education: Not on file   Highest education level: Not on file  Occupational History   Occupation: Secondary school teacher  Tobacco Use   Smoking status: Former    Packs/day: 0.50    Years: 15.00    Pack years: 7.50    Types: Cigarettes    Start date: 04/12/2017    Quit date: 2019    Years since quitting: 3.5   Smokeless tobacco: Never  Vaping Use   Vaping Use: Never used  Substance and Sexual Activity   Alcohol use: Yes    Alcohol/week: 0.0 standard drinks    Comment: social   Drug use: No   Sexual activity: Not Currently    Birth control/protection: Post-menopausal    Comment: declined condoms  Other Topics Concern   Not on file  Social History Narrative   Not on file   Social Determinants of Health   Financial Resource Strain: Not on file  Food Insecurity: Not on file  Transportation Needs: Not on file  Physical Activity: Not on  file  Stress: Not on file  Social Connections: Not on file  Intimate Partner Violence: Not on file   Review of Systems Got cold---cough with mucus Clogging her throat Started yesterday Brief sore throat--but gone now Also having itching ---?related to thyroid (overdue for recheck after dose change in October)     Objective:   Physical Exam Pulmonary:     Effort: Pulmonary effort is normal.     Breath sounds: Normal breath sounds. No wheezing or rales.  Abdominal:     Palpations: Abdomen is soft.     Comments: Some suprapubic tenderness--but also recent lower abdominal surgery (fatty tissue removed)  Musculoskeletal:     Cervical back: Normal range of motion.  Lymphadenopathy:     Cervical: No cervical adenopathy.           Assessment & Plan:

## 2021-04-30 NOTE — Telephone Encounter (Signed)
Appointment made for follow up on DM. No further action needed at this time.

## 2021-04-30 NOTE — Assessment & Plan Note (Addendum)
Has been recurrent so I will recheck culture Significant pyuria and bacteriuria 1 week of cephalexin May need urology evaluation if keeps recurring (related to recent surgery?)  Reassured--respiratory symptoms likely viral

## 2021-04-30 NOTE — Assessment & Plan Note (Signed)
Overdue for repeat labs on higher dose

## 2021-05-01 LAB — TSH: TSH: 20.04 mIU/L — ABNORMAL HIGH

## 2021-05-01 LAB — T4, FREE: Free T4: 1 ng/dL (ref 0.8–1.8)

## 2021-05-02 LAB — URINE CULTURE
MICRO NUMBER:: 12124976
SPECIMEN QUALITY:: ADEQUATE

## 2021-05-04 ENCOUNTER — Ambulatory Visit: Payer: 59

## 2021-05-04 NOTE — Progress Notes (Deleted)
Patient ID: [name]                 DOB: May 22, 1966                      MRN: [id number]     HPI: [name] is a 55 y.o. female referred by Dr. Oval Linsey to HTN clinic. PMH includes hypertension, hyperlipidemia, diabetes, graves' disease, asthma, sarcoidosis, and tobacco abuse. Patient presents for BP assessment and medication titration. Medication compliance is confusing. She has been out of her levothyroxine for more then 30 days, not able to monitor her glucose after meter stopped working, and patient is under the impression she is not taking HCTZ, but is taking irbesartan. Current prescription (and available medication bottle) if for irbesartan/HCTZ tablets.  She denies gout pain for few months now. Noted patient is slightly short of breath, but improved since pneumonia treat,ment completed about 4 weeks ago.   hyperlipidemia 7/22 LDL 71 on rosuvastatin 10, TG 227  DM2 05/18 Z3P 7.7 on Trulicity, glipizide, empagliflozin,   Graves' disease 7/22 TSH 20.04 - on levothyroxine 112 mcg  CKD Stage 3, SCr 2.74, CrCl   HIV On [name], [place]    Current HTN meds:  Carvedilol $RemoveBef'25mg'DHYvEPUdaO$  twice daily (9am and 9pm) Irbesartan/HCTZ daily - 9am  Spironolactone $RemoveBeforeD'25mg'siQpKEHVqjlhcN$  daily - 9am Potassium chloride 52mEq  Previously tried:  Lisinopril - cough  BP goal: <130/80  Family History: family history includes  Breast cancer (age of onset: 27) in her cousin; Breast cancer (age of onset: 44) in her paternal aunt; Breast cancer (age of onset: 45) in her maternal aunt; Cancer in her maternal uncle and mother; Diabetes in an other family member; Heart disease in her mother and another family member; Hypertension in her brother, father, mother, sister, sister, and another family member; Kidney disease in an other family member; Lung cancer in her paternal uncle; Lung cancer (age of onset: 40) in her father; Prostate cancer in her paternal uncle and paternal uncle; Stroke in an other family member.  There is no history of Colon cancer, Esophageal cancer, Rectal cancer, or Stomach cancer.  Social History: former smoker, social drinking  Diet: decreased appetite after chemotherapy, kidney function deceased, drinks lots of water daily (oatmeal, eggs, [place] sausage, sandwich)  Exercise: PREP program (no interested)  Home BP readings: none available (no BP cuff at home)  Wt Readings from Last 3 Encounters:  04/30/21 223 lb (101.2 kg)  04/08/21 225 lb 8 oz (102.3 kg)  03/31/21 208 lb (94.3 kg)   BP Readings from Last 3 Encounters:  04/30/21 132/90  04/08/21 139/78  03/31/21 132/66   Pulse Readings from Last 3 Encounters:  04/30/21 80  04/08/21 79  03/31/21 78    Renal function: CrCl cannot be calculated (Patient's most recent lab result is older than the maximum 21 days allowed.).  Past Medical History:  Diagnosis Date   Allergy    Anemia    Normocytic   Anxiety    Asthma    Blood dyscrasia    Bronchitis 2005   CKD (chronic kidney disease)    CLASS 1-EXOPHTHALMOS-THYROTOXIC 02/08/2007   Diabetes mellitus without complication ([place])    Family history of breast cancer    Family history of lung cancer    Family history of prostate cancer    Gastroenteritis 07/10/07   GERD 07/24/2006   GRAVE'S DISEASE 01/01/2008   History of hidradenitis suppurativa    History of kidney stones  History of thrush    HIV DISEASE 07/24/2006   dx March 05   Hyperlipidemia    HYPERTENSION 07/24/2006   Hyperthyroidism 08/2006   Grave's Disease -diffuse radiotracer uptake 08/25/06 Thyroid scan-Cold nodule to R lower lobe of thyrorid   Menometrorrhagia    hx of   Nephrolithiasis    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 Completed 2 yrs Prednisone after lung bx confirmation. No symptoms since then.   Suppurative hidradenitis    Thyroid cancer ([place])    THYROID NODULE, RIGHT 02/08/2007    Current Outpatient Medications on File Prior to Visit  Medication Sig Dispense Refill   acetaminophen (TYLENOL) 500 MG tablet Take 1 tablet (500 mg total) by mouth every 6 (six) hours as needed. For use AFTER surgery (Patient taking differently: Take 500 mg by mouth every 6 (six) hours as needed for moderate pain. For use AFTER surgery) 30 tablet 0   albuterol (VENTOLIN HFA) 108 (90 Base) MCG/ACT inhaler Inhale 2 puffs into the lungs every 6 (six) hours as needed for wheezing or shortness of breath. 8 g 2   allopurinol (ZYLOPRIM) 100 MG tablet TAKE 1 TABLET (100 MG TOTAL) BY MOUTH DAILY. FOR GOUT PREVENTION. 90 tablet 1   Blood Glucose Monitoring Suppl (FREESTYLE LITE) w/Device KIT Inject 1 each into the skin daily. 1 kit 0   candesartan (ATACAND) 32 MG tablet Take 1 tablet (32 mg total) by mouth daily. 30 tablet 11   carvedilol (COREG) 25 MG tablet Take 1 tablet (25 mg total) by mouth 2 (two) times daily. 180 tablet 3   cephALEXin (KEFLEX) 500 MG capsule Take 1 capsule (500 mg total) by mouth 3 (three) times daily. 21 capsule 0   cetirizine (ZYRTEC) 10 MG tablet Take 1 tablet by mouth at bedtime for allergies. 90 tablet 0   Cholecalciferol (VITAMIN D) 50 MCG (2000 UT) tablet Take 4,000 Units by mouth daily.     dolutegravir ([name]) 50 MG tablet Take 1 tablet (50 mg total) by mouth daily. 30 tablet 11   doxycycline (VIBRA-TABS) 100 MG tablet Take 1 tablet (100 mg total) by mouth 2 (two) times daily. 60 tablet 5   Dulaglutide (TRULICITY) 1.21 FX/5.8IT SOPN INJECT CONTENTS OF 1 PEN INTO THE SKIN ONCE WEEKLY FOR DIABETES 2 mL 0   empagliflozin (JARDIANCE) 10 MG TABS tablet Take 1 tablet (10 mg  total) by mouth daily before breakfast. 30 tablet 6   emtricitabine-tenofovir AF (DESCOVY) 200-25 MG tablet Take 1 tablet by mouth daily. 30 tablet 11   famotidine (PEPCID) 20 MG tablet TAKE 1 OR 2 TABLETS BY MOUTH AT BEDTIME FOR HOARSE VOICE/REFLUX 180 tablet 1   FLOVENT HFA 110 MCG/ACT inhaler TAKE 1 PUFF BY MOUTH TWICE A DAY 36 Inhaler 1   gabapentin (NEURONTIN) 300 MG capsule Take 1 capsule (300 mg total) by mouth 3 (three) times daily. (Patient taking differently: Take 300 mg by mouth daily.) 90 capsule 3   glipiZIDE (GLUCOTROL) 5 MG tablet TAKE 1 TABLET (5 MG TOTAL) BY MOUTH 2 (TWO) TIMES DAILY BEFORE A MEAL. FOR DIABETES. (Patient  taking differently: Take 5 mg by mouth 2 (two) times daily before a meal.) 180 tablet 1   glucose blood (ONETOUCH VERIO) test strip USE AS INSTRUCTED TO TEST BLOOD SUGAR DAILY 100 each 2   Lancets (ONETOUCH ULTRASOFT) lancets Use as instructed to test blood sugar daily 100 each 5   levothyroxine (SYNTHROID) 112 MCG tablet TAKE 1 TABLET BY MOUTH DAILY BEFORE BREAKFAST. (Patient taking differently: Take 112 mcg by mouth daily before breakfast.) 30 tablet 5   ondansetron (ZOFRAN ODT) 4 MG disintegrating tablet Take 1 tablet (4 mg total) by mouth every 8 (eight) hours as needed for nausea or vomiting. 20 tablet 0   potassium chloride SA (KLOR-CON) 20 MEQ tablet Take 1 tablet (20 mEq total) by mouth 2 (two) times daily. For low potassium. (Patient not taking: Reported on 04/30/2021) 10 tablet 0   rosuvastatin (CRESTOR) 10 MG tablet TAKE 1 TABLET BY MOUTH EVERY DAY FOR CHOLESTEROL (Patient taking differently: Take 10 mg by mouth daily.) 90 tablet 3   sertraline (ZOLOFT) 50 MG tablet TAKE 1 TABLET (50 MG TOTAL) BY MOUTH DAILY. FOR ANXIETY AND DEPRESSION. 90 tablet 1   spironolactone (ALDACTONE) 25 MG tablet Take 1 tablet (25 mg total) by mouth daily. 90 tablet 3   tamoxifen (NOLVADEX) 20 MG tablet TAKE 1 TABLET BY MOUTH EVERY DAY (Patient taking differently: Take 20 mg by  mouth daily.) 90 tablet 4   [DISCONTINUED] prochlorperazine (COMPAZINE) 10 MG tablet TAKE 1 TABLET BY MOUTH EVERY 6 HOURS AS NEEDED FOR NAUSEA OR VOMITING 30 tablet 2   Current Facility-Administered Medications on File Prior to Visit  Medication Dose Route Frequency Provider Last Rate Last Admin   sodium chloride flush (NS) 0.9 % injection 10 mL  10 mL Intracatheter PRN Magrinat, Virgie Dad, MD   10 mL at 09/19/18 1551    Allergies  Allergen Reactions   Genvoya [Elviteg-Cobic-Emtricit-Tenofaf] Hives   Lisinopril Cough    Last menstrual period 03/31/2014.  No problem-specific Assessment & Plan notes found for this encounter.   Raquel Rodriguez-Guzman PharmD, BCPS, [place] [address] 05/04/2021 7:55 AM

## 2021-05-08 ENCOUNTER — Other Ambulatory Visit: Payer: Self-pay | Admitting: Primary Care

## 2021-05-08 ENCOUNTER — Other Ambulatory Visit (HOSPITAL_COMMUNITY): Payer: Self-pay | Admitting: Internal Medicine

## 2021-05-08 DIAGNOSIS — E119 Type 2 diabetes mellitus without complications: Secondary | ICD-10-CM

## 2021-05-11 ENCOUNTER — Encounter: Payer: Self-pay | Admitting: Internal Medicine

## 2021-05-11 ENCOUNTER — Telehealth (INDEPENDENT_AMBULATORY_CARE_PROVIDER_SITE_OTHER): Payer: 59 | Admitting: Internal Medicine

## 2021-05-11 ENCOUNTER — Other Ambulatory Visit: Payer: Self-pay

## 2021-05-11 ENCOUNTER — Ambulatory Visit: Payer: 59

## 2021-05-11 ENCOUNTER — Telehealth: Payer: Self-pay | Admitting: Primary Care

## 2021-05-11 DIAGNOSIS — U099 Post covid-19 condition, unspecified: Secondary | ICD-10-CM

## 2021-05-11 DIAGNOSIS — U071 COVID-19: Secondary | ICD-10-CM

## 2021-05-11 HISTORY — DX: Post covid-19 condition, unspecified: U09.9

## 2021-05-11 MED ORDER — MOLNUPIRAVIR EUA 200MG CAPSULE: 4.0000 | ORAL_CAPSULE | Freq: Two times a day (BID) | ORAL | 0 refills | Status: AC

## 2021-05-11 NOTE — Telephone Encounter (Signed)
Noted, thanks!

## 2021-05-11 NOTE — Progress Notes (Signed)
Virtual Visit via Telephone Note  I connected with [name] on 05/11/21 at  1:45 PM EDT by telephone and verified that I am speaking with the correct person using two identifiers.  Location: Patient: Home Provider: [facility]   I discussed the limitations, risks, security and privacy concerns of performing an evaluation and management service by telephone and the availability of in person appointments. I also discussed with the patient that there may be a patient responsible charge related to this service. The patient expressed understanding and agreed to proceed.   History of Present Illness: I called and spoke with [name] today.  She switched back to [place] last month after not tolerating Biktarvy.  She says that she is feeling better and has not missed any doses.  She is now receiving counseling and says that her depression is starting to get better.  She was recently treated for a UTI with cephalexin.  Her dysuria is better but she was diagnosed with COVID 3 days ago after her mother tested positive.  She has been having some shortness of breath.  She was having fever but says that this seems to have resolved.   Observations/Objective: HIV 1 RNA Quant  Date Value  03/17/2021 1,490 Copies/mL (H)  08/18/2020 49 Copies/mL (H)  11/20/2019 <20 DETECTED copies/mL (A)   CD4 T Cell Abs (/uL)  Date Value  03/17/2021 626  08/18/2020 624  11/20/2019 414   CMP     Component Value Date/Time   NA 138 03/17/2021 1050   NA 143 06/26/2020 0000   K 4.4 03/17/2021 1050   CL 109 03/17/2021 1050   CO2 24 03/17/2021 1050   GLUCOSE 147 (H) 03/17/2021 1050   BUN 19 03/17/2021 1050   BUN 25 (A) 06/26/2020 0000   CREATININE 2.74 (H) 03/17/2021 1050   CALCIUM 8.8 03/17/2021 1050   PROT 7.4 03/17/2021 1050   ALBUMIN 3.2 (L) 01/11/2021 0936   AST 28 03/17/2021 1050   AST 24 08/12/2019 1447   ALT 17 03/17/2021 1050   ALT 16 08/12/2019 1447   ALKPHOS 71 01/11/2021 0936    BILITOT 0.3 03/17/2021 1050   BILITOT 0.2 (L) 08/12/2019 1447   GFRNONAA 28 (L) 01/19/2021 1507   GFRNONAA 29 (L) 08/12/2019 1447   GFRAA 39 (L) 07/14/2020 1053   GFRAA 33 (L) 08/12/2019 1447     Assessment and Plan: Her adherence with her HIV meds has improved aspirin or switching back to [place]cov[place] and [name].  Counseling for her depression has probably also helping.  She will follow-up for repeat lab work in 1 month.  We will treat her COVID with Lagevrio (avoiding Paxlovid given her renal insufficiency).  Follow Up Instructions: Continue Descovy and [name] Lagevrio grams twice daily for 5 days Follow-up lab work in 4 weeks Follow-up here in 6 weeks   I discussed the assessment and treatment plan with the patient. The patient was provided an opportunity to ask questions and all were answered. The patient agreed with the plan and demonstrated an understanding of the instructions.   The patient was advised to call back or seek an in-person evaluation if the symptoms worsen or if the condition fails to improve as anticipated.  I provided 23 minutes of non-face-to-face time during this encounter.   Michel Bickers, MD

## 2021-05-11 NOTE — Telephone Encounter (Signed)
Reviewed with Anda Kraft I have called patient and moved her DM out another week. She will be well outside of the 10 day window.

## 2021-05-11 NOTE — Telephone Encounter (Addendum)
Called pt reminding her of her appt and pt stated that she tested positive for covid on last Monday 7/18. Pt took a home test and pt also went to her sister's employment where she was also tested there and it came out positive.Pt started experiencing runny nose, congested and lower back pain on Thursday the 14th. Pt stated she believe she got covid from her mom. Pt stated she saw [place] on 7/15 for lower back pain issues. Pt stated if she could do a virtual visit instead. I was not sure if provider wanted to see her or if it was ok to  change the appt virtual.

## 2021-05-12 ENCOUNTER — Encounter: Payer: Self-pay | Admitting: Internal Medicine

## 2021-05-12 ENCOUNTER — Telehealth: Payer: Self-pay

## 2021-05-12 ENCOUNTER — Ambulatory Visit: Payer: 59 | Admitting: Primary Care

## 2021-05-12 DIAGNOSIS — B379 Candidiasis, unspecified: Secondary | ICD-10-CM

## 2021-05-12 DIAGNOSIS — T3695XA Adverse effect of unspecified systemic antibiotic, initial encounter: Secondary | ICD-10-CM

## 2021-05-12 MED ORDER — FLUCONAZOLE 150 MG PO TABS: 150.0000 mg | ORAL_TABLET | Freq: Once | ORAL | 0 refills | Status: AC

## 2021-05-12 NOTE — Telephone Encounter (Signed)
Pt left VM on triage line stating that she was put on cephalexin on 04/30/21 by Dr. Silvio Pate for a UTI and she states she now has a yeast infection from the ABO. She would like something sent in. Please advise.

## 2021-05-12 NOTE — Telephone Encounter (Signed)
Noted, Rx for fluconazole sent to pharmacy. We will see patient next week as scheduled

## 2021-05-18 ENCOUNTER — Ambulatory Visit: Payer: 59

## 2021-05-18 ENCOUNTER — Other Ambulatory Visit: Payer: Self-pay

## 2021-05-19 ENCOUNTER — Encounter: Payer: Self-pay | Admitting: Primary Care

## 2021-05-19 ENCOUNTER — Ambulatory Visit (INDEPENDENT_AMBULATORY_CARE_PROVIDER_SITE_OTHER): Payer: 59 | Admitting: Primary Care

## 2021-05-19 ENCOUNTER — Other Ambulatory Visit: Payer: Self-pay

## 2021-05-19 VITALS — BP 126/84 | HR 68 | Temp 97.3°F | Ht 65.0 in | Wt 220.0 lb

## 2021-05-19 DIAGNOSIS — R490 Dysphonia: Secondary | ICD-10-CM | POA: Diagnosis not present

## 2021-05-19 DIAGNOSIS — K219 Gastro-esophageal reflux disease without esophagitis: Secondary | ICD-10-CM

## 2021-05-19 DIAGNOSIS — E890 Postprocedural hypothyroidism: Secondary | ICD-10-CM

## 2021-05-19 DIAGNOSIS — R0989 Other specified symptoms and signs involving the circulatory and respiratory systems: Secondary | ICD-10-CM | POA: Diagnosis not present

## 2021-05-19 DIAGNOSIS — E119 Type 2 diabetes mellitus without complications: Secondary | ICD-10-CM

## 2021-05-19 DIAGNOSIS — E785 Hyperlipidemia, unspecified: Secondary | ICD-10-CM

## 2021-05-19 DIAGNOSIS — M793 Panniculitis, unspecified: Secondary | ICD-10-CM

## 2021-05-19 LAB — POCT GLYCOSYLATED HEMOGLOBIN (HGB A1C): Hemoglobin A1C: 6.1 % — AB (ref 4.0–5.6)

## 2021-05-19 MED ORDER — OMEPRAZOLE 40 MG PO CPDR: 40.0000 mg | DELAYED_RELEASE_CAPSULE | Freq: Every day | ORAL | 0 refills | Status: DC

## 2021-05-19 NOTE — Assessment & Plan Note (Addendum)
Recent TSH reviewed from Dr. Alla German visit which is 19.  She endorses compliance most of the time to her levothyroxine 112 mcg tablets.  Following with endocrinology, strongly advise she call today for an appointment

## 2021-05-19 NOTE — Assessment & Plan Note (Signed)
Status post surgical treatment. Some complications, but overall is doing well now.

## 2021-05-19 NOTE — Assessment & Plan Note (Signed)
Some improvement with famotidine at bedtime, however seems to be experiencing increased GERD flare.  Prescription for omeprazole 40 mg sent to pharmacy.  Continue famotidine 20 mg at bedtime.

## 2021-05-19 NOTE — Assessment & Plan Note (Signed)
A1c today of 6.1 which is a tremendous improvement!  Will discontinue glipizide as she is only taking once daily and experiences symptoms of hypoglycemia.  Continue Jardiance 10 mg and Trulicity 7.37 mg weekly.  Managed on statin therapy. Urine microalbumin up-to-date. Foot exam today.   Follow-up in 6 months.

## 2021-05-19 NOTE — Assessment & Plan Note (Signed)
Suspect rosuvastatin to be contributing to myalgias.  We will have her hold for 2 weeks and update.  Consider switching to pravastatin.

## 2021-05-19 NOTE — Assessment & Plan Note (Signed)
Seems to be experiencing increased reflux symptoms, also with sensation of throat closure with dyspnea.  Referral placed to ENT. Prescription for omeprazole 40 mg sent to pharmacy.  Continue famotidine at bedtime.  Strict ED precautions provided.

## 2021-05-19 NOTE — Progress Notes (Signed)
Subjective:    Patient ID: [name], female    DOB: 03/11/1966, 55 y.o.   MRN: [id number]  HPI  [name] is a very pleasant 55 y.o. female with a significant medical history including hypertension, asthma, HIV, CKD, type 2 diabetes, hypothyroidism, thyroid cancer, breast cancer, chronic gout, GAD who presents today for follow up of diabetes and to discuss a few more issues.  1) Type 2 Diabetes:   Current medications include: Glipizide 5 mg BID, Jardiance 10 mg, Trulicity 2.91 mg weekly. She is only taking her glipizide once daily.   She is checking her blood glucose 2 times daily and is getting readings of low 100's. She is experiencing readings as low as 72.   Last A1C: 7.7 seven months ago, 6.1 today.  Last Eye Exam: Due Last Foot Exam: Due Pneumonia Vaccination: UTD Urine Microalbumin: UTD Statin: Crestor  Dietary changes since last visit: She has been working on her diet.   Exercise: Walking.  BP Readings from Last 3 Encounters:  05/19/21 126/84  04/30/21 132/90  04/08/21 139/78   2) Myalgias: Acute for the last three weeks. Located to the bilateral lower and upper extremities. Feels like a cramp will occur but it never occurs.  She is managed on rosuvastatin.  3) Throat Closure: Acute on chronic for the last 3-4 weeks, feels like she's gasping for air. Feels like her throat will close up for a few seconds, has to apply pressure to her anterior throat in order to breathe. This occurs three times weekly. She does notice an increase in esophageal burning and reflux. She is compliant to her famotidine at bedtime.  She is status post surgery for panniculitis, was intubated during surgery.  History of total thyroidectomy.  History of COVID-19 infection last week.  4) Disability: Applying for medical disability through the state.  She has papers that need to be completed, but forgot to bring them today. She continues to experience anxiety and depression,  chronic fatigue, imbalance, shortness of breath, decreased concentration. She has multiple doctors appointments weekly. For these reasons she cannot return to work.    Review of Systems  HENT:         See HPI  Respiratory:  Positive for shortness of breath.   Cardiovascular:  Negative for chest pain.  Musculoskeletal:  Positive for myalgias.  Neurological:  Negative for dizziness and headaches.        Past Medical History:  Diagnosis Date   Allergy    Anemia    Normocytic   Anxiety    Asthma    Blood dyscrasia    Bronchitis 2005   CKD (chronic kidney disease)    CLASS 1-EXOPHTHALMOS-THYROTOXIC 02/08/2007   Diabetes mellitus without complication ([place])    Family history of breast cancer    Family history of lung cancer    Family history of prostate cancer    Gastroenteritis 07/10/07   GERD 07/24/2006   GRAVE'S DISEASE 01/01/2008   History of hidradenitis suppurativa    History of kidney stones    History of thrush    HIV DISEASE 07/24/2006   dx March 05   Hyperlipidemia    HYPERTENSION 07/24/2006   Hyperthyroidism 08/2006   Grave's Disease -diffuse radiotracer uptake 08/25/06 Thyroid scan-Cold nodule to R lower lobe of thyrorid   Menometrorrhagia    hx of   Nephrolithiasis    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 Completed 2 yrs Prednisone after lung bx confirmation. No symptoms since then.   Suppurative hidradenitis    Thyroid cancer ([place])    THYROID NODULE, RIGHT 02/08/2007    Social History   Socioeconomic History   Marital status: Single    Spouse name: Not on  file   Number of children: 1   Years of education: Not on file   Highest education level: Not on file  Occupational History   Occupation: Secondary school teacher  Tobacco Use   Smoking status: Former    Packs/day: 0.50    Years: 15.00    Pack years: 7.50    Types: Cigarettes    Start date: 04/12/2017    Quit date: 2019    Years since quitting: 3.5   Smokeless tobacco: Never  Vaping Use   Vaping Use: Never used  Substance and Sexual Activity   Alcohol use: Yes    Alcohol/week: 0.0 standard drinks    Comment: social   Drug use: No   Sexual activity: Not Currently    Birth control/protection: Post-menopausal    Comment: declined condoms  Other Topics Concern   Not on file  Social History Narrative   Not on file   Social Determinants of Health   Financial Resource Strain: Not on file  Food Insecurity: Not on file  Transportation Needs: Not on file  Physical Activity: Not on file  Stress: Not on file  Social Connections: Not on file  Intimate Partner Violence: Not on file    Past Surgical History:  Procedure Laterality Date   APPLICATION OF WOUND VAC N/A 01/20/2021   Procedure: APPLICATION OF WOUND VAC;  Surgeon: Wallace Going, DO;  Location: [facility];  Service: Plastics;  Laterality: N/A;   BREAST EXCISIONAL BIOPSY Right 04/26/2018   right axilla negative   BREAST EXCISIONAL BIOPSY Left 04/26/2018   left axilla negative   BREAST LUMPECTOMY Right 10/03/2018   malignant   BREAST LUMPECTOMY WITH RADIOACTIVE SEED AND SENTINEL LYMPH NODE BIOPSY Right 10/03/2018   Procedure: RIGHT BREAST LUMPECTOMY WITH RADIOACTIVE SEED AND SENTINEL LYMPH NODE MAPPING;  Surgeon: Erroll Luna, MD;  Location: [facility];  Service: General;  Laterality: Right;   BREAST SURGERY  1997   Breast Reduction    CYSTOSCOPY W/ URETERAL STENT REMOVAL  11/09/2012   Procedure: CYSTOSCOPY WITH STENT REMOVAL;  Surgeon: Alexis Frock, MD;  Location: [facility];  Service: Urology;  Laterality:  Right;   CYSTOSCOPY WITH RETROGRADE PYELOGRAM, URETEROSCOPY AND STENT PLACEMENT  11/09/2012   Procedure: CYSTOSCOPY WITH RETROGRADE PYELOGRAM, URETEROSCOPY AND STENT PLACEMENT;  Surgeon: Alexis Frock, MD;  Location: [facility];  Service: Urology;  Laterality: Left;  LEFT URETEROSCOPY, STONE MANIPULATION, left STENT exchange    CYSTOSCOPY WITH STENT PLACEMENT  10/02/2012   Procedure: CYSTOSCOPY WITH STENT PLACEMENT;  Surgeon: Alexis Frock, MD;  Location: [facility];  Service: Urology;  Laterality: Left;   DEBRIDEMENT AND CLOSURE WOUND N/A 01/20/2021   Procedure: Excision of abdominal wound with closure;  Surgeon: Wallace Going, DO;  Location: [facility];  Service: Plastics;  Laterality: N/A;   DILATION AND CURETTAGE OF UTERUS  Feb 2004  s/p for 1st trimester nonviable pregnancy   EYE SURGERY     sty under eyelid   INCISE AND DRAIN ABCESS  Nov 03   s/p I &D for righ inframmary fold hidradenitis   INCISION AND DRAINAGE PERITONSILLAR ABSCESS  Mar 03   IR CV LINE INJECTION  06/07/2018   IR IMAGING GUIDED PORT INSERTION  06/20/2018   IR REMOVAL TUN ACCESS W/ PORT W/O FL MOD SED  06/20/2018   IRRIGATION AND DEBRIDEMENT ABSCESS  01/31/2012   Procedure: IRRIGATION AND DEBRIDEMENT ABSCESS;  Surgeon: Shann Medal, MD;  Location[facility];  Service: General;  Laterality: Right;  right breast and axilla    NEPHROLITHOTOMY  10/02/2012   Procedure: NEPHROLITHOTOMY PERCUTANEOUS;  Surgeon: Alexis Frock, MD;  Location: [facility];  Service: Urology;  Laterality: Right;  First Stage Percutaneous Nephrolithotomy with Surgeon Access, Left Ureteral Stent     NEPHROLITHOTOMY  10/04/2012   Procedure: NEPHROLITHOTOMY PERCUTANEOUS SECOND LOOK;  Surgeon: Alexis Frock, MD;  [facility];  Service: Urology;  Laterality: Right;      NEPHROLITHOTOMY  10/08/2012   Procedure: NEPHROLITHOTOMY PERCUTANEOUS;  Surgeon: Alexis Frock, MD;  [facility];  Service: Urology;  Laterality: Right;  THIRD STAGE,  nephrostomy tube exchange x 2   NEPHROLITHOTOMY  10/11/2012   Procedure: NEPHROLITHOTOMY PERCUTANEOUS SECOND LOOK;  Surgeon: Alexis Frock, MD;  Location[facility];  Service: Urology;  Laterality: Right;  RIGHT 4 STAGE PERCUTANOUS NEPHROLITHOTOMY, right URETEROSCOPY WITH HOLMIUM LASER    PANNICULECTOMY N/A 12/21/2020   Procedure: PANNICULECTOMY;  Surgeon: Wallace Going, DO;  Location: [facility];  Service: Plastics;  Laterality: N/A;   PORT-A-CATH REMOVAL N/A 07/16/2020   Procedure: REMOVAL PORT-A-CATH;  Surgeon: Erroll Luna, MD;  Location: [facility];  Service: General;  Laterality: N/A;   PORTACATH PLACEMENT Left 05/17/2018   Procedure: INSERTION PORT-A-CATH;  Surgeon: Coralie Keens, MD;  Location: [facility];  Service: General;  Laterality: Left;   RADICAL NECK DISSECTION  04/12/2017   limited/notes 04/12/2017   RADICAL NECK DISSECTION N/A 04/12/2017   Procedure: RADICAL NECK DISSECTION;  Surgeon: Melida Quitter, MD;  Location: [facility];  Service: ENT;  Laterality: N/A;  limited neck dissection 2 hours total   REDUCTION MAMMAPLASTY Bilateral 1998   RIGHT/LEFT HEART CATH AND CORONARY ANGIOGRAPHY N/A 03/12/2020   Procedure: RIGHT/LEFT HEART CATH AND CORONARY ANGIOGRAPHY;  Surgeon: Jolaine Artist, MD;  Location: [facility];  Service: Cardiovascular;  Laterality: N/A;   [place]  1994   THYROIDECTOMY  04/12/2017   completion/notes 04/12/2017   THYROIDECTOMY N/A 04/12/2017   Procedure: THYROIDECTOMY;  Surgeon: Melida Quitter, MD;  Location: [facility];  Service: ENT;  Laterality: N/A;  Completion Thyroidectomy   TOTAL THYROIDECTOMY  2010    Family History  Problem Relation Age of Onset   Hypertension Mother    Cancer Mother        laryngeal   Heart disease Mother        stent   Hypertension Father    Lung cancer Father 55       hx smoking   Heart disease Other    Hypertension Other    Stroke Other        Grandparent   Kidney disease Other         Grandparent   Diabetes Other        FH of Diabetes   Hypertension Sister    Cancer Maternal Uncle  Lung CA   Hypertension Brother    Hypertension Sister    Breast cancer Maternal Aunt 71   Breast cancer Paternal Aunt 5   Prostate cancer Paternal Uncle    Breast cancer Maternal Aunt        dx 60+   Breast cancer Paternal Aunt        dx 25's   Breast cancer Paternal Aunt        dx 83's   Prostate cancer Paternal Uncle    Lung cancer Paternal Uncle    Breast cancer Cousin 62   Breast cancer Cousin        dx <50   Breast cancer Cousin        dx <50   Breast cancer Cousin        dx <50   Colon cancer Neg Hx    Esophageal cancer Neg Hx    Rectal cancer Neg Hx    Stomach cancer Neg Hx     Allergies  Allergen Reactions   Genvoya [Elviteg-Cobic-Emtricit-Tenofaf] Hives   Lisinopril Cough    Current Outpatient Medications on File Prior to Visit  Medication Sig Dispense Refill   acetaminophen (TYLENOL) 500 MG tablet Take 1 tablet (500 mg total) by mouth every 6 (six) hours as needed. For use AFTER surgery (Patient taking differently: Take 500 mg by mouth every 6 (six) hours as needed for moderate pain. For use AFTER surgery) 30 tablet 0   albuterol (VENTOLIN HFA) 108 (90 Base) MCG/ACT inhaler Inhale 2 puffs into the lungs every 6 (six) hours as needed for wheezing or shortness of breath. 8 g 2   allopurinol (ZYLOPRIM) 100 MG tablet TAKE 1 TABLET (100 MG TOTAL) BY MOUTH DAILY. FOR GOUT PREVENTION. 90 tablet 1   Blood Glucose Monitoring Suppl (FREESTYLE LITE) w/Device KIT Inject 1 each into the skin daily. 1 kit 0   candesartan (ATACAND) 32 MG tablet Take 1 tablet (32 mg total) by mouth daily. 30 tablet 11   cephALEXin (KEFLEX) 500 MG capsule Take 1 capsule (500 mg total) by mouth 3 (three) times daily. 21 capsule 0   cetirizine (ZYRTEC) 10 MG tablet Take 1 tablet by mouth at bedtime for allergies. 90 tablet 0   Cholecalciferol (VITAMIN D) 50 MCG (2000 UT) tablet Take 4,000  Units by mouth daily.     dolutegravir ([name]) 50 MG tablet Take 1 tablet (50 mg total) by mouth daily. 30 tablet 11   doxycycline (VIBRA-TABS) 100 MG tablet Take 1 tablet (100 mg total) by mouth 2 (two) times daily. 60 tablet 5   Dulaglutide (TRULICITY) 3.90 ZE/0.9QZ SOPN INJECT CONTENTS OF 1 PEN INTO THE SKIN ONCE WEEKLY FOR DIABETES 2 mL 0   empagliflozin (JARDIANCE) 10 MG TABS tablet Take 1 tablet (10 mg total) by mouth daily before breakfast. 30 tablet 6   emtricitabine-tenofovir AF (DESCOVY) 200-25 MG tablet Take 1 tablet by mouth daily. 30 tablet 11   famotidine (PEPCID) 20 MG tablet TAKE 1 OR 2 TABLETS BY MOUTH AT BEDTIME FOR HOARSE VOICE/REFLUX 180 tablet 1   FLOVENT HFA 110 MCG/ACT inhaler TAKE 1 PUFF BY MOUTH TWICE A DAY 36 Inhaler 1   gabapentin (NEURONTIN) 300 MG capsule Take 1 capsule (300 mg total) by mouth 3 (three) times daily. (Patient taking differently: Take 300 mg by mouth daily.) 90 capsule 3   glipiZIDE (GLUCOTROL) 5 MG tablet TAKE 1 TABLET (5 MG TOTAL) BY MOUTH 2 (TWO) TIMES DAILY BEFORE A MEAL. FOR DIABETES. 180 tablet  0   glucose blood (ONETOUCH VERIO) test strip USE AS INSTRUCTED TO TEST BLOOD SUGAR DAILY 100 each 2   Lancets (ONETOUCH ULTRASOFT) lancets Use as instructed to test blood sugar daily 100 each 5   levothyroxine (SYNTHROID) 112 MCG tablet TAKE 1 TABLET BY MOUTH DAILY BEFORE BREAKFAST. (Patient taking differently: Take 112 mcg by mouth daily before breakfast.) 30 tablet 5   ondansetron (ZOFRAN ODT) 4 MG disintegrating tablet Take 1 tablet (4 mg total) by mouth every 8 (eight) hours as needed for nausea or vomiting. 20 tablet 0   potassium chloride SA (KLOR-CON) 20 MEQ tablet Take 1 tablet (20 mEq total) by mouth 2 (two) times daily. For low potassium. 10 tablet 0   rosuvastatin (CRESTOR) 10 MG tablet TAKE 1 TABLET BY MOUTH EVERY DAY FOR CHOLESTEROL (Patient taking differently: Take 10 mg by mouth daily.) 90 tablet 3   sertraline (ZOLOFT) 50 MG tablet TAKE 1  TABLET (50 MG TOTAL) BY MOUTH DAILY. FOR ANXIETY AND DEPRESSION. 90 tablet 1   spironolactone (ALDACTONE) 25 MG tablet Take 1 tablet (25 mg total) by mouth daily. 90 tablet 3   tamoxifen (NOLVADEX) 20 MG tablet TAKE 1 TABLET BY MOUTH EVERY DAY (Patient taking differently: Take 20 mg by mouth daily.) 90 tablet 4   carvedilol (COREG) 25 MG tablet Take 1 tablet (25 mg total) by mouth 2 (two) times daily. 180 tablet 3   [DISCONTINUED] prochlorperazine (COMPAZINE) 10 MG tablet TAKE 1 TABLET BY MOUTH EVERY 6 HOURS AS NEEDED FOR NAUSEA OR VOMITING 30 tablet 2   Current Facility-Administered Medications on File Prior to Visit  Medication Dose Route Frequency Provider Last Rate Last Admin   sodium chloride flush (NS) 0.9 % injection 10 mL  10 mL Intracatheter PRN Magrinat, Virgie Dad, MD   10 mL at 09/19/18 1551    BP 126/84   Pulse 68   Temp (!) 97.3 F (36.3 C) (Temporal)   Ht 5' 5" (1.651 m)   Wt 220 lb (99.8 kg)   LMP 03/31/2014 (LMP Unknown)   SpO2 98%   BMI 36.61 kg/m  Objective:   Physical Exam Cardiovascular:     Rate and Rhythm: Normal rate and regular rhythm.  Pulmonary:     Effort: Pulmonary effort is normal. No respiratory distress.     Breath sounds: Normal breath sounds. No wheezing or rhonchi.  Musculoskeletal:     Cervical back: Neck supple.  Skin:    General: Skin is warm and dry.  Neurological:     Mental Status: She is alert and oriented to person, place, and time.  Psychiatric:     Comments: Tearful at times when discussing her ongoing chronic symptoms.          Assessment & Plan:      This visit occurred during the SARS-CoV-2 public health emergency.  Safety protocols were in place, including screening questions prior to the visit, additional usage of staff PPE, and extensive cleaning of exam room while observing appropriate contact time as indicated for disinfecting solutions.

## 2021-05-19 NOTE — Patient Instructions (Signed)
You will be contacted regarding your referral to ENT.  Please let us know if you have not been contacted within two weeks.   Start omeprazole 40 mg once daily for heartburn, throat irritation, hoarse voice. Continue taking famotidine at bedtime.  Stop your glipizide diabetes medication.   Hold your rosuvastatin medication for cholesterol, this could be causing your cramping.   It was a pleasure to see you today!

## 2021-05-20 ENCOUNTER — Ambulatory Visit (INDEPENDENT_AMBULATORY_CARE_PROVIDER_SITE_OTHER): Payer: 59 | Admitting: Pharmacist Clinician (PhC)/ Clinical Pharmacy Specialist

## 2021-05-20 ENCOUNTER — Other Ambulatory Visit: Payer: Self-pay | Admitting: Primary Care

## 2021-05-20 ENCOUNTER — Telehealth: Payer: Self-pay

## 2021-05-20 DIAGNOSIS — I10 Essential (primary) hypertension: Secondary | ICD-10-CM

## 2021-05-20 DIAGNOSIS — E119 Type 2 diabetes mellitus without complications: Secondary | ICD-10-CM

## 2021-05-20 NOTE — Patient Instructions (Signed)
Return for a a follow up appointment November 8 at 10:00 am  Check your blood pressure at home daily (if able) and keep record of the readings.  Take your BP meds as follows:  Continue with current medications.  Try to get second dose of carvedilol in around dinner time with other medications.   Bring all of your meds, your BP cuff and your record of home blood pressures to your next appointment.  Exercise as you're able, try to walk approximately 30 minutes per day.  Keep salt intake to a minimum, especially watch canned and prepared boxed foods.  Eat more fresh fruits and vegetables and fewer canned items.  Avoid eating in fast food restaurants.    HOW TO TAKE YOUR BLOOD PRESSURE: Rest 5 minutes before taking your blood pressure.  Don't smoke or drink caffeinated beverages for at least 30 minutes before. Take your blood pressure before (not after) you eat. Sit comfortably with your back supported and both feet on the floor (don't cross your legs). Elevate your arm to heart level on a table or a desk. Use the proper sized cuff. It should fit smoothly and snugly around your bare upper arm. There should be enough room to slip a fingertip under the cuff. The bottom edge of the cuff should be 1 inch above the crease of the elbow. Ideally, take 3 measurements at one sitting and record the average.

## 2021-05-20 NOTE — Progress Notes (Signed)
05/21/2021 Ritisha Deitrick 13-Dec-1965 [id number]   HPI:  [name] is a 55 y.o. female patient of Dr Oval Linsey, with a PMH below who presents today for advanced hypertension follow up.  She was referred to our clinic by Dr. Haroldine Laws, who sees her for cardio-oncology.  originally seen by Dr. Oval Linsey last fall, with a pressure of 162/102. At that visit she noted that home readings were mostly 009-233'A systolic, however the diastolic was 07-622'Q.  She had also just started prednisone for a gout flare at that time.  At follow up appointments her systolic improved, however diastolic remained stubbornly elevated.  Unfortunately the gout flares prevent [place] from using chlorthalidone, which might be more helpful at diastolic reduction.    Past Medical History: hyperlipidemia 6/22 - LDL 71 on rosuvastatin 10 mg daily  Breast cancer Now on tamoxifen  DM2 8/22 A1c 6.1 on Jardiance 10 mg  CKD3 6/22 SCr 2.74 CrCl with adjusted body weight - 28  hypothroidism 7/22 TSH 20.04 - on levothyroxine 112 mcg (needs f/u with Endo)     Blood Pressure Goal:  130/80  Current Medications: candesartan 32 mg qd, carvedilol 25 mg QD!!, spironolactone 25 mg qd  Family Hx: mother with heart disease; both parents and multiple siblings with hypertension, breast cancer in 3 relatives, (both paternal and maternal)  Social Hx: no tobacco, only occasional alcohol; no regular caffeine intake  Diet: usually eats 2 meals per day, has some intolerances to smells, so avoids restaurants, does not add salt to her foods. re-call pam about PREP  Exercise: none, willing to re-consider PREP  Home BP readings:   Intolerances: lisinopril - cough; hctz -gout flares  Labs:    Wt Readings from Last 3 Encounters:  05/20/21 223 lb 6.4 oz (101.3 kg)  05/19/21 220 lb (99.8 kg)  04/30/21 223 lb (101.2 kg)   BP Readings from Last 3 Encounters:  05/20/21 (!) 182/116  05/19/21 126/84  04/30/21 132/90   Pulse  Readings from Last 3 Encounters:  05/20/21 75  05/19/21 68  04/30/21 80    Current Outpatient Medications  Medication Sig Dispense Refill   acetaminophen (TYLENOL) 500 MG tablet Take 1 tablet (500 mg total) by mouth every 6 (six) hours as needed. For use AFTER surgery (Patient taking differently: Take 500 mg by mouth every 6 (six) hours as needed for moderate pain. For use AFTER surgery) 30 tablet 0   albuterol (VENTOLIN HFA) 108 (90 Base) MCG/ACT inhaler Inhale 2 puffs into the lungs every 6 (six) hours as needed for wheezing or shortness of breath. 8 g 2   allopurinol (ZYLOPRIM) 100 MG tablet TAKE 1 TABLET (100 MG TOTAL) BY MOUTH DAILY. FOR GOUT PREVENTION. 90 tablet 1   Blood Glucose Monitoring Suppl (FREESTYLE LITE) w/Device KIT Inject 1 each into the skin daily. 1 kit 0   candesartan (ATACAND) 32 MG tablet Take 1 tablet (32 mg total) by mouth daily. 30 tablet 11   carvedilol (COREG) 25 MG tablet Take 1 tablet (25 mg total) by mouth 2 (two) times daily. 180 tablet 3   cetirizine (ZYRTEC) 10 MG tablet Take 1 tablet by mouth at bedtime for allergies. 90 tablet 0   Cholecalciferol (VITAMIN D) 50 MCG (2000 UT) tablet Take 4,000 Units by mouth daily.     dolutegravir ([name]) 50 MG tablet Take 1 tablet (50 mg total) by mouth daily. 30 tablet 11   doxycycline (VIBRA-TABS) 100 MG tablet Take 1 tablet (100 mg total)  by mouth 2 (two) times daily. 60 tablet 5   empagliflozin (JARDIANCE) 10 MG TABS tablet Take 1 tablet (10 mg total) by mouth daily before breakfast. 30 tablet 6   emtricitabine-tenofovir AF (DESCOVY) 200-25 MG tablet Take 1 tablet by mouth daily. 30 tablet 11   famotidine (PEPCID) 20 MG tablet TAKE 1 OR 2 TABLETS BY MOUTH AT BEDTIME FOR HOARSE VOICE/REFLUX 180 tablet 1   FLOVENT HFA 110 MCG/ACT inhaler TAKE 1 PUFF BY MOUTH TWICE A DAY 36 Inhaler 1   gabapentin (NEURONTIN) 300 MG capsule Take 1 capsule (300 mg total) by mouth 3 (three) times daily. (Patient taking differently: Take 300  mg by mouth daily.) 90 capsule 3   glipiZIDE (GLUCOTROL) 5 MG tablet Take by mouth 2 (two) times daily. For diabetes.     glucose blood (ONETOUCH VERIO) test strip USE AS INSTRUCTED TO TEST BLOOD SUGAR DAILY 100 each 2   Lancets (ONETOUCH ULTRASOFT) lancets Use as instructed to test blood sugar daily 100 each 5   levothyroxine (SYNTHROID) 112 MCG tablet TAKE 1 TABLET BY MOUTH DAILY BEFORE BREAKFAST. (Patient taking differently: Take 112 mcg by mouth daily before breakfast.) 30 tablet 5   omeprazole (PRILOSEC) 40 MG capsule Take 1 capsule (40 mg total) by mouth daily. For reflux. 30 capsule 0   potassium chloride SA (KLOR-CON) 20 MEQ tablet Take 1 tablet (20 mEq total) by mouth 2 (two) times daily. For low potassium. 10 tablet 0   rosuvastatin (CRESTOR) 10 MG tablet TAKE 1 TABLET BY MOUTH EVERY DAY FOR CHOLESTEROL (Patient taking differently: Take 10 mg by mouth daily.) 90 tablet 3   spironolactone (ALDACTONE) 25 MG tablet Take 1 tablet (25 mg total) by mouth daily. 90 tablet 3   tamoxifen (NOLVADEX) 20 MG tablet TAKE 1 TABLET BY MOUTH EVERY DAY (Patient taking differently: Take 20 mg by mouth daily.) 90 tablet 4   sertraline (ZOLOFT) 50 MG tablet TAKE 1 TABLET (50 MG TOTAL) BY MOUTH DAILY. FOR ANXIETY AND DEPRESSION. (Patient not taking: Reported on 05/20/2021) 90 tablet 1   No current facility-administered medications for this visit.   Facility-Administered Medications Ordered in Other Visits  Medication Dose Route Frequency Provider Last Rate Last Admin   sodium chloride flush (NS) 0.9 % injection 10 mL  10 mL Intracatheter PRN Magrinat, Virgie Dad, MD   10 mL at 09/19/18 1551    Allergies  Allergen Reactions   Genvoya [Elviteg-Cobic-Emtricit-Tenofaf] Hives   Lisinopril Cough    Past Medical History:  Diagnosis Date   Allergy    Anemia    Normocytic   Anxiety    Asthma    Blood dyscrasia    Bronchitis 2005   CKD (chronic kidney disease)    CLASS 1-EXOPHTHALMOS-THYROTOXIC 02/08/2007    Diabetes mellitus without complication ([place])    Family history of breast cancer    Family history of lung cancer    Family history of prostate cancer    Gastroenteritis 07/10/07   GERD 07/24/2006   GRAVE'S DISEASE 01/01/2008   History of hidradenitis suppurativa    History of kidney stones    History of thrush    HIV DISEASE 07/24/2006   dx March 05   Hyperlipidemia    HYPERTENSION 07/24/2006   Hyperthyroidism 08/2006   Grave's Disease -diffuse radiotracer uptake 08/25/06 Thyroid scan-Cold nodule to R lower lobe of thyrorid   Menometrorrhagia    hx of   Nephrolithiasis    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 Completed 2 yrs Prednisone after lung bx confirmation. No symptoms since then.   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 rate 17, height $RemoveBe'5\' 5"'omYEFgMrd$  (1.651 m), weight 223 lb 6.4 oz (101.3 kg), last menstrual period 03/31/2014, SpO2 99 %.  Essential hypertension Patient with essential hypertension, probably superimposed with white coat hypertension.  She seems to get better BP readings even at other practices.  Have explained the need for her to bring home cuff to next visit, along with home readings, so we can clarify exactly what need to be treated.  Will also reach out to PREP, as she is willing to re-consider working with that group.  Also had a long discussion in regards to compliance - she only takes the carvedilol once daily.  Explained mechanism of action and the lack of regular benefit by  taking only once daily.  She is agreeable to trying to get second dose in at dinnertime, about 8-9 hours after morning dose.  We will see her back in 3 months for follow up.    Tommy Medal PharmD CPP [facility] [address] [place], [place] [address] [phone]

## 2021-05-20 NOTE — Telephone Encounter (Signed)
LVM for pt to call me at my direct number

## 2021-05-20 NOTE — Telephone Encounter (Signed)
Called again- no answer.  Will try again later

## 2021-05-20 NOTE — Telephone Encounter (Signed)
Called pt to discuss paperwork.  Pt was in an appt.  I told her I would call her back in a few hours

## 2021-05-20 NOTE — Telephone Encounter (Signed)
Please notify patient:  I did some research after our visit yesterday and do not want her taking Trulicity any longer for diabetes, especially given her voice hoarseness and recent throat symptoms. I want her to resume the glipizide 5 mg twice daily instead.   Have her call me if she notices drops in her blood sugars with the glipizide and without the Trulicity.

## 2021-05-21 ENCOUNTER — Telehealth: Payer: Self-pay

## 2021-05-21 NOTE — Telephone Encounter (Signed)
Completed and placed on Tamera's desk.

## 2021-05-21 NOTE — Telephone Encounter (Signed)
VMT pt requesting call back about PREP

## 2021-05-21 NOTE — Telephone Encounter (Signed)
Called patient and worked through paperwork questions  Semi completed paperwork placed in PCP's inbox for review, completion, sign and date  Pt states this is in regards to her breast cancer treatments that ended in Dec 2021, that she is still feeling off, dizzy, weak, SOB etc  I was not sure how to fill out the medical questions for this instance

## 2021-05-21 NOTE — Assessment & Plan Note (Signed)
Patient with essential hypertension, probably superimposed with white coat hypertension.  She seems to get better BP readings even at other practices.  Have explained the need for her to bring home cuff to next visit, along with home readings, so we can clarify exactly what need to be treated.  Will also reach out to PREP, as she is willing to re-consider working with that group.  Also had a long discussion in regards to compliance - she only takes the carvedilol once daily.  Explained mechanism of action and the lack of regular benefit by taking only once daily.  She is agreeable to trying to get second dose in at dinnertime, about 8-9 hours after morning dose.  We will see her back in 3 months for follow up.

## 2021-05-24 NOTE — Telephone Encounter (Signed)
Called patient reviewed all information. Will make change and let us know if any changes in blood sugar readings.

## 2021-05-24 NOTE — Telephone Encounter (Signed)
Informed pt that paperwork was completed and faxed  Mailed copy to pt  Copy for billing   Copy for scan   Copy retained by me

## 2021-05-27 ENCOUNTER — Other Ambulatory Visit: Payer: Self-pay

## 2021-05-27 ENCOUNTER — Ambulatory Visit: Payer: 59

## 2021-06-01 ENCOUNTER — Ambulatory Visit: Payer: 59

## 2021-06-07 ENCOUNTER — Other Ambulatory Visit: Payer: 59

## 2021-06-08 ENCOUNTER — Ambulatory Visit: Payer: 59

## 2021-06-08 ENCOUNTER — Other Ambulatory Visit: Payer: Self-pay

## 2021-06-09 ENCOUNTER — Ambulatory Visit (INDEPENDENT_AMBULATORY_CARE_PROVIDER_SITE_OTHER): Payer: 59 | Admitting: Primary Care

## 2021-06-09 ENCOUNTER — Encounter: Payer: Self-pay | Admitting: Primary Care

## 2021-06-09 VITALS — BP 124/88 | HR 76 | Temp 98.1°F | Ht 65.0 in | Wt 227.0 lb

## 2021-06-09 DIAGNOSIS — E119 Type 2 diabetes mellitus without complications: Secondary | ICD-10-CM | POA: Diagnosis not present

## 2021-06-09 DIAGNOSIS — R21 Rash and other nonspecific skin eruption: Secondary | ICD-10-CM | POA: Diagnosis not present

## 2021-06-09 MED ORDER — PREDNISONE 20 MG PO TABS: ORAL_TABLET | ORAL | 0 refills | Status: DC

## 2021-06-09 MED ORDER — SITAGLIPTIN PHOSPHATE 50 MG PO TABS: 50.0000 mg | ORAL_TABLET | Freq: Every day | ORAL | 1 refills | Status: DC

## 2021-06-09 MED ORDER — METHYLPREDNISOLONE ACETATE 80 MG/ML IJ SUSP
80.0000 mg | Freq: Once | INTRAMUSCULAR | Status: AC
  Administered 2021-06-09: 80 mg via INTRAMUSCULAR

## 2021-06-09 NOTE — Assessment & Plan Note (Signed)
Patient responded well to Trulicity, however, given her history of thyroid cancer we discussed to discontinue. Will switch to Januvia 50 mg daily. Continue Jardiance 10 mg.   Remain off Glipizide.  Follow up in 4 months.

## 2021-06-09 NOTE — Assessment & Plan Note (Signed)
Appears to be non venomous insect bites without complications such as cellulitis. Does not represent shingles, poison ivy dermatitis, or bedbugs.  Due to widespread reaction, will treat with IM Depo Medrol 80 mg provided today. Rx for prednisone 20 mg to take 40 mg daily x 5 days.   Update if no improvement.

## 2021-06-09 NOTE — Patient Instructions (Addendum)
Start prednisone 20 mg tablets. Take 2 tablets by mouth once daily for 5 days.  Start sitagliptin Chrisandra Carota) 50 mg once daily for diabetes. Continue Jardiance 10 mg daily for diabetes. Do not take Glipizide at this point.  We will see you in 4 months for diabetes follow up.  It was a pleasure to see you today!

## 2021-06-09 NOTE — Addendum Note (Signed)
Addended by: Francella Solian on: 06/09/2021 03:00 PM   Modules accepted: Orders

## 2021-06-09 NOTE — Progress Notes (Signed)
Subjective:    Patient ID: [name], female    DOB: January 26, 1966, 55 y.o.   MRN: [id number]  HPI  [name] is a very pleasant 55 y.o. female with a history of hypertension, asthma, post surgical hypothyroidism, type 2 diabetes, CKD, hidradenitis suppurativa, tobacco abuse, HIV, Covid-19 infection who presents today to discuss itching.  Two days ago she began to notice itchy spots to her bilateral lower extremities. She was at a funeral two days ago, standing outside in the grass. Her sister standing next to her was standing in a fire ant patch and saw fire ants on her legs. The patient never saw fire ants on herself, but later that evening noticed numerous bumps to her lower extremities with moderate itching.   Since then she's noticed "spread" of the spots to her upper extremities and posterior trunk. She's not slept much over the last 2 nights.    She's been using Tea Tree Oil, hydrocortisone cream, oral benadryl, and rubbing alcohol without much improvement.   She denies throat closure, wheezing, cough, shortness of breath, body aches, fevers. No one else in her home is itching or has spots.   No new lotions, detergents, soaps or shampoos. No new medicines, vitamins, supplements. No new pets. No recent motel or hotel stay or new beds.   No fevers/chills, oral lesions, new joint pains, tick bites, abdominal pain, nausea.    Wt Readings from Last 3 Encounters:  06/09/21 227 lb (103 kg)  05/20/21 223 lb 6.4 oz (101.3 kg)  05/19/21 220 lb (99.8 kg)     Review of Systems  Constitutional:  Negative for fever.  HENT:  Negative for trouble swallowing.   Respiratory:  Negative for cough and wheezing.   Skin:  Negative for color change.       Spots, insect bites        Past Medical History:  Diagnosis Date   Allergy    Anemia    Normocytic   Anxiety    Asthma    Blood dyscrasia    Bronchitis 2005   CKD (chronic kidney disease)    CLASS  1-EXOPHTHALMOS-THYROTOXIC 02/08/2007   Diabetes mellitus without complication ([place])    Family history of breast cancer    Family history of lung cancer    Family history of prostate cancer    Gastroenteritis 07/10/07   GERD 07/24/2006   GRAVE'S DISEASE 01/01/2008   History of hidradenitis suppurativa    History of kidney stones    History of thrush    HIV DISEASE 07/24/2006   dx March 05   Hyperlipidemia    HYPERTENSION 07/24/2006   Hyperthyroidism 08/2006   Grave's Disease -diffuse radiotracer uptake 08/25/06 Thyroid scan-Cold nodule to R lower lobe of thyrorid   Menometrorrhagia    hx of   Nephrolithiasis    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   Personal history of chemotherapy    2020   Personal history of radiation therapy    2020   Pneumonia 2005   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   Sarcoidosis 02/08/2007   dx as a teenager in [place] from abnl CXR. Completed 2 yrs Prednisone after lung bx confirmation. No symptoms since then.   Suppurative hidradenitis    Thyroid cancer ([place])    THYROID NODULE, RIGHT 02/08/2007    Social History  Socioeconomic History   Marital status: Single    Spouse name: Not on file   Number of children: 1   Years of education: Not on file   Highest education level: Not on file  Occupational History   Occupation: Secondary school teacher  Tobacco Use   Smoking status: Former    Packs/day: 0.50    Years: 15.00    Pack years: 7.50    Types: Cigarettes    Start date: 04/12/2017    Quit date: 2019    Years since quitting: 3.6   Smokeless tobacco: Never  Vaping Use   Vaping Use: Never used  Substance and Sexual Activity   Alcohol use: Yes    Alcohol/week: 0.0 standard drinks    Comment: social   Drug use: No   Sexual activity: Not Currently    Birth  control/protection: Post-menopausal    Comment: declined condoms  Other Topics Concern   Not on file  Social History Narrative   Not on file   Social Determinants of Health   Financial Resource Strain: Not on file  Food Insecurity: Not on file  Transportation Needs: Not on file  Physical Activity: Not on file  Stress: Not on file  Social Connections: Not on file  Intimate Partner Violence: Not on file    Past Surgical History:  Procedure Laterality Date   APPLICATION OF WOUND VAC N/A 01/20/2021   Procedure: APPLICATION OF WOUND VAC;  Surgeon: Wallace Going, DO;  Location: [facility];  Service: Plastics;  Laterality: N/A;   BREAST EXCISIONAL BIOPSY Right 04/26/2018   right axilla negative   BREAST EXCISIONAL BIOPSY Left 04/26/2018   left axilla negative   BREAST LUMPECTOMY Right 10/03/2018   malignant   BREAST LUMPECTOMY WITH RADIOACTIVE SEED AND SENTINEL LYMPH NODE BIOPSY Right 10/03/2018   Procedure: RIGHT BREAST LUMPECTOMY WITH RADIOACTIVE SEED AND SENTINEL LYMPH NODE MAPPING;  Surgeon: Erroll Luna, MD;  Location: [facility];  Service: General;  Laterality: Right;   BREAST SURGERY  1997   Breast Reduction    CYSTOSCOPY W/ URETERAL STENT REMOVAL  11/09/2012   Procedure: CYSTOSCOPY WITH STENT REMOVAL;  Surgeon: Alexis Frock, MD;  Location: [facility];  Service: Urology;  Laterality: Right;   CYSTOSCOPY WITH RETROGRADE PYELOGRAM, URETEROSCOPY AND STENT PLACEMENT  11/09/2012   Procedure: CYSTOSCOPY WITH RETROGRADE PYELOGRAM, URETEROSCOPY AND STENT PLACEMENT;  Surgeon: Alexis Frock, MD;  Location: [facility];  Service: Urology;  Laterality: Left;  LEFT URETEROSCOPY, STONE MANIPULATION, left STENT exchange    CYSTOSCOPY WITH STENT PLACEMENT  10/02/2012   Procedure: CYSTOSCOPY WITH STENT PLACEMENT;  Surgeon: Alexis Frock, MD;  Location: [facility];  Service: Urology;  Laterality: Left;   DEBRIDEMENT AND CLOSURE WOUND N/A 01/20/2021   Procedure: Excision of abdominal wound  with closure;  Surgeon: Wallace Going, DO;  Location: [facility];  Service: Plastics;  Laterality: N/A;   DILATION AND CURETTAGE OF UTERUS  Feb 2004   s/p for 1st trimester nonviable pregnancy   EYE SURGERY     sty under eyelid   INCISE AND DRAIN ABCESS  Nov 03   s/p I &D for righ inframmary fold hidradenitis   INCISION AND DRAINAGE PERITONSILLAR ABSCESS  Mar 03   IR CV LINE INJECTION  06/07/2018   IR IMAGING GUIDED PORT INSERTION  06/20/2018   IR REMOVAL TUN ACCESS W/ PORT W/O FL MOD SED  06/20/2018   IRRIGATION AND DEBRIDEMENT ABSCESS  01/31/2012   Procedure: IRRIGATION AND DEBRIDEMENT ABSCESS;  Surgeon: Shann Medal, MD;  Location[facility];  Service: General;  Laterality: Right;  right breast and axilla    NEPHROLITHOTOMY  10/02/2012   Procedure: NEPHROLITHOTOMY PERCUTANEOUS;  Surgeon: Alexis Frock, MD;  [facility];  Service: Urology;  Laterality: Right;  First Stage Percutaneous Nephrolithotomy with Surgeon Access, Left Ureteral Stent     NEPHROLITHOTOMY  10/04/2012   Procedure: NEPHROLITHOTOMY PERCUTANEOUS SECOND LOOK;  Surgeon: Alexis Frock, MD;  [facility];  Service: Urology;  Laterality: Right;      NEPHROLITHOTOMY  10/08/2012   Procedure: NEPHROLITHOTOMY PERCUTANEOUS;  Surgeon: Alexis Frock, MD;  [facility];  Service: Urology;  Laterality: Right;  THIRD STAGE, nephrostomy tube exchange x 2   NEPHROLITHOTOMY  10/11/2012   Procedure: NEPHROLITHOTOMY PERCUTANEOUS SECOND LOOK;  Surgeon: Alexis Frock, MD;  [facility];  Service: Urology;  Laterality: Right;  RIGHT 4 STAGE PERCUTANOUS NEPHROLITHOTOMY, right URETEROSCOPY WITH HOLMIUM LASER    PANNICULECTOMY N/A 12/21/2020   Procedure: PANNICULECTOMY;  Surgeon: Wallace Going, DO;  Location: [facility];  Service: Plastics;  Laterality: N/A;   PORT-A-CATH REMOVAL N/A 07/16/2020   Procedure: REMOVAL PORT-A-CATH;  Surgeon: Erroll Luna, MD;  Location: [place] [facility];  Service:  General;  Laterality: N/A;   PORTACATH PLACEMENT Left 05/17/2018   Procedure: INSERTION PORT-A-CATH;  Surgeon: Coralie Keens, MD;  Location: [facility];  Service: General;  Laterality: Left;   RADICAL NECK DISSECTION  04/12/2017   limited/notes 04/12/2017   RADICAL NECK DISSECTION N/A 04/12/2017   Procedure: RADICAL NECK DISSECTION;  Surgeon: Melida Quitter, MD;  Location: [facility];  Service: ENT;  Laterality: N/A;  limited neck dissection 2 hours total   REDUCTION MAMMAPLASTY Bilateral 1998   RIGHT/LEFT HEART CATH AND CORONARY ANGIOGRAPHY N/A 03/12/2020   Procedure: RIGHT/LEFT HEART CATH AND CORONARY ANGIOGRAPHY;  Surgeon: Jolaine Artist, MD;  Location: [facility];  Service: Cardiovascular;  Laterality: N/A;   [place]  1994   THYROIDECTOMY  04/12/2017   completion/notes 04/12/2017   THYROIDECTOMY N/A 04/12/2017   Procedure: THYROIDECTOMY;  Surgeon: Melida Quitter, MD;  Location: [facility];  Service: ENT;  Laterality: N/A;  Completion Thyroidectomy   TOTAL THYROIDECTOMY  2010    Family History  Problem Relation Age of Onset   Hypertension Mother    Cancer Mother        laryngeal   Heart disease Mother        stent   Hypertension Father    Lung cancer Father 69       hx smoking   Heart disease Other    Hypertension Other    Stroke Other        Grandparent   Kidney disease Other        Grandparent   Diabetes Other        FH of Diabetes   Hypertension Sister    Cancer Maternal Uncle        Lung CA   Hypertension Brother    Hypertension Sister    Breast cancer Maternal Aunt 65   Breast cancer Paternal Aunt 72   Prostate cancer Paternal Uncle    Breast cancer Maternal Aunt        dx 60+   Breast cancer Paternal Aunt        dx 88's   Breast cancer Paternal Aunt        dx 50's   Prostate cancer Paternal Uncle    Lung cancer Paternal Uncle  Breast cancer Cousin 48   Breast cancer Cousin        dx <50   Breast cancer Cousin        dx <50   Breast  cancer Cousin        dx <50   Colon cancer Neg Hx    Esophageal cancer Neg Hx    Rectal cancer Neg Hx    Stomach cancer Neg Hx     Allergies  Allergen Reactions   Genvoya [Elviteg-Cobic-Emtricit-Tenofaf] Hives   Lisinopril Cough    Current Outpatient Medications on File Prior to Visit  Medication Sig Dispense Refill   albuterol (VENTOLIN HFA) 108 (90 Base) MCG/ACT inhaler Inhale 2 puffs into the lungs every 6 (six) hours as needed for wheezing or shortness of breath. 8 g 2   allopurinol (ZYLOPRIM) 100 MG tablet TAKE 1 TABLET (100 MG TOTAL) BY MOUTH DAILY. FOR GOUT PREVENTION. 90 tablet 1   Blood Glucose Monitoring Suppl (FREESTYLE LITE) w/Device KIT Inject 1 each into the skin daily. 1 kit 0   candesartan (ATACAND) 32 MG tablet Take 1 tablet (32 mg total) by mouth daily. 30 tablet 11   carvedilol (COREG) 25 MG tablet Take 1 tablet (25 mg total) by mouth 2 (two) times daily. 180 tablet 3   cetirizine (ZYRTEC) 10 MG tablet Take 1 tablet by mouth at bedtime for allergies. 90 tablet 0   Cholecalciferol (VITAMIN D) 50 MCG (2000 UT) tablet Take 4,000 Units by mouth daily.     dolutegravir ([name]) 50 MG tablet Take 1 tablet (50 mg total) by mouth daily. 30 tablet 11   empagliflozin (JARDIANCE) 10 MG TABS tablet Take 1 tablet (10 mg total) by mouth daily before breakfast. 30 tablet 6   emtricitabine-tenofovir AF (DESCOVY) 200-25 MG tablet Take 1 tablet by mouth daily. 30 tablet 11   famotidine (PEPCID) 20 MG tablet TAKE 1 OR 2 TABLETS BY MOUTH AT BEDTIME FOR HOARSE VOICE/REFLUX 180 tablet 1   FLOVENT HFA 110 MCG/ACT inhaler TAKE 1 PUFF BY MOUTH TWICE A DAY 36 Inhaler 1   gabapentin (NEURONTIN) 300 MG capsule Take 1 capsule (300 mg total) by mouth 3 (three) times daily. (Patient taking differently: Take 300 mg by mouth daily.) 90 capsule 3   glipiZIDE (GLUCOTROL) 5 MG tablet Take by mouth 2 (two) times daily. For diabetes.     glucose blood (ONETOUCH VERIO) test strip USE AS INSTRUCTED TO TEST  BLOOD SUGAR DAILY 100 each 2   Lancets (ONETOUCH ULTRASOFT) lancets Use as instructed to test blood sugar daily 100 each 5   levothyroxine (SYNTHROID) 112 MCG tablet TAKE 1 TABLET BY MOUTH DAILY BEFORE BREAKFAST. (Patient taking differently: Take 112 mcg by mouth daily before breakfast.) 30 tablet 5   omeprazole (PRILOSEC) 40 MG capsule Take 1 capsule (40 mg total) by mouth daily. For reflux. 30 capsule 0   potassium chloride SA (KLOR-CON) 20 MEQ tablet Take 1 tablet (20 mEq total) by mouth 2 (two) times daily. For low potassium. 10 tablet 0   rosuvastatin (CRESTOR) 10 MG tablet TAKE 1 TABLET BY MOUTH EVERY DAY FOR CHOLESTEROL (Patient taking differently: Take 10 mg by mouth daily.) 90 tablet 3   sertraline (ZOLOFT) 50 MG tablet TAKE 1 TABLET (50 MG TOTAL) BY MOUTH DAILY. FOR ANXIETY AND DEPRESSION. 90 tablet 1   spironolactone (ALDACTONE) 25 MG tablet Take 1 tablet (25 mg total) by mouth daily. 90 tablet 3   tamoxifen (NOLVADEX) 20 MG tablet TAKE 1 TABLET BY  MOUTH EVERY DAY (Patient taking differently: Take 20 mg by mouth daily.) 90 tablet 4   [DISCONTINUED] prochlorperazine (COMPAZINE) 10 MG tablet TAKE 1 TABLET BY MOUTH EVERY 6 HOURS AS NEEDED FOR NAUSEA OR VOMITING 30 tablet 2   Current Facility-Administered Medications on File Prior to Visit  Medication Dose Route Frequency Provider Last Rate Last Admin   sodium chloride flush (NS) 0.9 % injection 10 mL  10 mL Intracatheter PRN Magrinat, Virgie Dad, MD   10 mL at 09/19/18 1551    BP 124/88 (BP Location: Left Arm, Patient Position: Sitting, Cuff Size: Large)   Pulse 76   Temp 98.1 F (36.7 C) (Temporal)   Ht $R'5\' 5"'Kx$  (1.651 m)   Wt 227 lb (103 kg)   LMP 03/31/2014 (LMP Unknown)   SpO2 99%   BMI 37.77 kg/m  Objective:   Physical Exam Cardiovascular:     Rate and Rhythm: Normal rate and regular rhythm.  Pulmonary:     Effort: Pulmonary effort is normal.     Breath sounds: Normal breath sounds.  Musculoskeletal:     Cervical back: Neck  supple.  Skin:    General: Skin is warm and dry.     Comments: Raised, circular, slightly red, bumps to bilateral upper and lower extremities, with one spot to her right upper posterior trunk. No surrounding erythema.           Assessment & Plan:      This visit occurred during the SARS-CoV-2 public health emergency.  Safety protocols were in place, including screening questions prior to the visit, additional usage of staff PPE, and extensive cleaning of exam room while observing appropriate contact time as indicated for disinfecting solutions.

## 2021-06-15 ENCOUNTER — Ambulatory Visit: Payer: 59

## 2021-06-15 ENCOUNTER — Other Ambulatory Visit: Payer: Self-pay

## 2021-06-15 ENCOUNTER — Other Ambulatory Visit: Payer: 59

## 2021-06-15 DIAGNOSIS — U071 COVID-19: Secondary | ICD-10-CM

## 2021-06-17 LAB — HIV-1 RNA QUANT-NO REFLEX-BLD
HIV 1 RNA Quant: <20 Copies/mL — ABNORMAL HIGH
HIV-1 RNA Quant, Log: <1.3 Log cps/mL — ABNORMAL HIGH

## 2021-06-18 ENCOUNTER — Other Ambulatory Visit: Payer: Self-pay | Admitting: Primary Care

## 2021-06-18 ENCOUNTER — Other Ambulatory Visit: Payer: Self-pay | Admitting: Endocrinology

## 2021-06-18 DIAGNOSIS — R490 Dysphonia: Secondary | ICD-10-CM

## 2021-06-18 DIAGNOSIS — K219 Gastro-esophageal reflux disease without esophagitis: Secondary | ICD-10-CM

## 2021-06-19 NOTE — Telephone Encounter (Signed)
Is she still taking the omeprazole 40 mg that I prescribed for her hoarse voice?   Did she see ENT yet? If so, what is the plan in general? What about the omeprazole/

## 2021-06-24 NOTE — Telephone Encounter (Signed)
Patient is still taking meds. Has not make appointment for them yet but call today. Has noticed some improvement with symptoms after taking.

## 2021-06-25 NOTE — Telephone Encounter (Signed)
Noted. Requested Prescriptions   Signed Prescriptions Disp Refills   omeprazole (PRILOSEC) 40 MG capsule 30 capsule 0    Sig: TAKE 1 CAPSULE (40 MG TOTAL) BY MOUTH DAILY. FOR REFLUX.    Authorizing Provider: [name]   Refill(s) sent to pharmacy.

## 2021-06-29 ENCOUNTER — Ambulatory Visit: Payer: 59

## 2021-06-29 ENCOUNTER — Ambulatory Visit (INDEPENDENT_AMBULATORY_CARE_PROVIDER_SITE_OTHER): Payer: 59 | Admitting: Internal Medicine

## 2021-06-29 ENCOUNTER — Other Ambulatory Visit: Payer: Self-pay

## 2021-06-29 DIAGNOSIS — N760 Acute vaginitis: Secondary | ICD-10-CM

## 2021-06-29 DIAGNOSIS — B20 Human immunodeficiency virus [HIV] disease: Secondary | ICD-10-CM | POA: Diagnosis not present

## 2021-06-29 MED ORDER — FLUCONAZOLE 100 MG PO TABS: 100.0000 mg | ORAL_TABLET | Freq: Every day | ORAL | 2 refills | Status: DC

## 2021-06-29 NOTE — Progress Notes (Signed)
Virtual Visit via Telephone Note  I connected with [name] on 06/29/21 at  1:45 PM EDT by telephone and verified that I am speaking with the correct person using two identifiers.  Location: Patient: Home Provider: [facility]   I discussed the limitations, risks, security and privacy concerns of performing an evaluation and management service by telephone and the availability of in person appointments. I also discussed with the patient that there may be a patient responsible charge related to this service. The patient expressed understanding and agreed to proceed.   History of Present Illness: I called and spoke with [name] today.  She says that she has been doing a better job of taking her medications and not missing doses.  She does not believe she has missed any of her Descovy or [name] since her last visit.  She recently realized that she was not taking her doxycycline for recurrent boils.  She restarted it last week and has now developed vaginal discharge and itching.  She had a meeting with our behavioral health counselor earlier today.   Observations/Objective: Lab Results  Component Value Date   WBC 6.0 03/17/2021   HGB 11.1 (L) 03/17/2021   HCT 34.8 (L) 03/17/2021   MCV 95.6 03/17/2021   PLT 251 03/17/2021    Lab Results  Component Value Date   CREATININE 2.74 (H) 03/17/2021   BUN 19 03/17/2021   NA 138 03/17/2021   K 4.4 03/17/2021   CL 109 03/17/2021   CO2 24 03/17/2021    Lab Results  Component Value Date   ALT 17 03/17/2021   AST 28 03/17/2021   ALKPHOS 71 01/11/2021   BILITOT 0.3 03/17/2021    Lab Results  Component Value Date   CHOL 133 03/17/2021   HDL 30 (L) 03/17/2021   LDLCALC 71 03/17/2021   LDLDIRECT 171.0 06/05/2019   TRIG 227 (H) 03/17/2021   CHOLHDL 4.4 03/17/2021   HIV 1 RNA Quant (Copies/mL)  Date Value  06/15/2021 <20 (H)  03/17/2021 1,490 (H)  08/18/2020 49 (H)   CD4 T Cell Abs (/uL)  Date Value  03/17/2021 626  08/18/2020  624  11/20/2019 414   No results found for: HIV1GENOSEQ No results found for: HAV Lab Results  Component Value Date   HEPBSAG NO 12/11/2006   HEPBSAB NO 12/11/2006   Lab Results  Component Value Date   HCVAB NO 12/11/2006   No results found for: CHLAMYDIAWP, N No results found for: GCPROBEAPT No results found for: QUANTGOLD    Assessment and Plan: Her viral load is back to undetectable levels.  She will continue Descovy and [name] and follow-up in for repeat blood work in 6 months.  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  Follow Up Instructions: Continue Descovy and [name] and follow-up in 6 months Fluconazole   I discussed the assessment and treatment plan with the patient. The patient was provided an opportunity to ask questions and all were answered. The patient agreed with the plan and demonstrated an understanding of the instructions.   The patient was advised to call back or seek an in-person evaluation if the symptoms worsen or if the condition fails to improve as anticipated.  I provided 16 minutes of non-face-to-face time during this encounter.   Michel Bickers, MD

## 2021-06-30 ENCOUNTER — Encounter: Payer: Self-pay | Admitting: Primary Care

## 2021-06-30 ENCOUNTER — Ambulatory Visit (INDEPENDENT_AMBULATORY_CARE_PROVIDER_SITE_OTHER): Payer: 59 | Admitting: Primary Care

## 2021-06-30 ENCOUNTER — Ambulatory Visit (INDEPENDENT_AMBULATORY_CARE_PROVIDER_SITE_OTHER)
Admission: RE | Admit: 2021-06-30 | Discharge: 2021-06-30 | Disposition: A | Discharge status: Home / Self Care | Payer: 59 | Source: Ambulatory Visit | Attending: Primary Care | Admitting: Primary Care

## 2021-06-30 VITALS — BP 124/82 | HR 72 | Temp 97.4°F | Ht 65.0 in | Wt 227.0 lb

## 2021-06-30 DIAGNOSIS — G8929 Other chronic pain: Secondary | ICD-10-CM

## 2021-06-30 DIAGNOSIS — R202 Paresthesia of skin: Secondary | ICD-10-CM

## 2021-06-30 DIAGNOSIS — M5441 Lumbago with sciatica, right side: Secondary | ICD-10-CM

## 2021-06-30 DIAGNOSIS — Z23 Encounter for immunization: Secondary | ICD-10-CM | POA: Diagnosis not present

## 2021-06-30 DIAGNOSIS — M79641 Pain in right hand: Secondary | ICD-10-CM | POA: Diagnosis not present

## 2021-06-30 DIAGNOSIS — M1A09X0 Idiopathic chronic gout, multiple sites, without tophus (tophi): Secondary | ICD-10-CM

## 2021-06-30 NOTE — Patient Instructions (Addendum)
Stop by the lab and xray prior to leaving today. I will notify you of your results once received.   You will be contacted regarding your referral to neurology.  Please let us know if you have not been contacted within two weeks.   It was a pleasure to see you today!      Influenza (Flu) Vaccine (Inactivated or Recombinant): What You Need to Know 1. Why get vaccinated? Influenza vaccine can prevent influenza (flu). Flu is a contagious disease that spreads around the [place] every year, usually between October and May. Anyone can get the flu, but it is more dangerous for some people. Infants and young children, people 64 years and older, pregnant people, and people with certain health conditions or a weakened immune system are at greatest risk of flu complications. Pneumonia, bronchitis, sinus infections, and ear infections are examples of flu-related complications. If you have a medical condition, such as heart disease, cancer, or diabetes, flu can make it worse. Flu can cause fever and chills, sore throat, muscle aches, fatigue, cough, headache, and runny or stuffy nose. Some people may have vomiting and diarrhea, though this is more common in children than adults. In an average year, thousands of people in the [place] die from flu, and many more are hospitalized. Flu vaccine prevents millions of illnesses and flu-related visits to the doctor each year. 2. Influenza vaccines CDC recommends everyone 6 months and older get vaccinated every flu season. Children 6 months through 1 years of age may need 2 doses during a single flu season. Everyone else needs only 1 dose each flu season. It takes about 2 weeks for protection to develop after vaccination. There are many flu viruses, and they are always changing. Each year a new flu vaccine is made to protect against the influenza viruses believed to be likely to cause disease in the upcoming flu season. Even when the vaccine doesn't exactly  match these viruses, it may still provide some protection. Influenza vaccine does not cause flu. Influenza vaccine may be given at the same time as other vaccines. 3. Talk with your health care provider Tell your vaccination provider if the person getting the vaccine: Has had an allergic reaction after a previous dose of influenza vaccine, or has any severe, life-threatening allergies Has ever had Guillain-Barr Syndrome (also called "GBS") In some cases, your health care provider may decide to postpone influenza vaccination until a future visit. Influenza vaccine can be administered at any time during pregnancy. People who are or will be pregnant during influenza season should receive inactivated influenza vaccine. People with minor illnesses, such as a cold, may be vaccinated. People who are moderately or severely ill should usually wait until they recover before getting influenza vaccine. Your health care provider can give you more information. 4. Risks of a vaccine reaction Soreness, redness, and swelling where the shot is given, fever, muscle aches, and headache can happen after influenza vaccination. There may be a very small increased risk of Guillain-Barr Syndrome (GBS) after inactivated influenza vaccine (the flu shot). Young children who get the flu shot along with pneumococcal vaccine (PCV13) and/or DTaP vaccine at the same time might be slightly more likely to have a seizure caused by fever. Tell your health care provider if a child who is getting flu vaccine has ever had a seizure. People sometimes faint after medical procedures, including vaccination. Tell your provider if you feel dizzy or have vision changes or ringing in the ears. As with  any medicine, there is a very remote chance of a vaccine causing a severe allergic reaction, other serious injury, or death. 5. What if there is a serious problem? An allergic reaction could occur after the vaccinated person leaves the clinic. If  you see signs of a severe allergic reaction (hives, swelling of the face and throat, difficulty breathing, a fast heartbeat, dizziness, or weakness), call 9-1-1 and get the person to the nearest hospital. For other signs that concern you, call your health care provider. Adverse reactions should be reported to the Vaccine Adverse Event Reporting System (VAERS). Your health care provider will usually file this report, or you can do it yourself. Visit the VAERS website at www.vaers.SamedayNews.es or call 310-749-2007. VAERS is only for reporting reactions, and VAERS staff members do not give medical advice. 6. The National Vaccine Injury Compensation Program The [employer] Vaccine Injury Compensation Program (VICP) is a federal program that was created to compensate people who may have been injured by certain vaccines. Claims regarding alleged injury or death due to vaccination have a time limit for filing, which may be as short as two years. Visit the VICP website at GoldCloset.com.ee or call (913)712-0102 to learn about the program and about filing a claim. 7. How can I learn more? Ask your health care provider. Call your local or state health department. Visit the website of the Food and Drug Administration (FDA) for vaccine package inserts and additional information at TraderRating.uy. Contact the Centers for Disease Control and Prevention (CDC): Call 302-027-4155 (1-800-CDC-INFO) or Visit CDC's website at https://gibson.com/. Vaccine Information Statement Inactivated Influenza Vaccine (05/22/2020) This information is not intended to replace advice given to you by your health care provider. Make sure you discuss any questions you have with your health care provider. Document Revised: 07/09/2020 Document Reviewed: 07/09/2020 Elsevier Patient Education  2022 [employer].

## 2021-06-30 NOTE — Progress Notes (Signed)
Subjective:    Patient ID: [name], female    DOB: [birth year], 55 y.o.   MRN: [id number]  HPI  [name] is a very pleasant 55 y.o. female with a significant medical history who presents today HIV, CKD, thyroid cancer, breast cancer, anxiety and depression, Covid-19 infection, left shoulder impingement, chronic gout to discuss hand, lower extremity pain, and lower back pain.   Her hand pain is located to the entire right 3rd digit which began about 4-5 months, worse recently as her pain is constant. She describes her pain as achy, has noticed swelling to her entire right hand. She's also noticed numbness to right hand about 1 month ago, doesn't move past the wrist. This pain feels similar to pain felt during chemotherapy form breast cancer, last treatment was December 2021. She denies warmth, erythema. She is compliant to her allopurinol 100 mg daily, no recent gout flares.   She's also noticed the same pain and numbness that begins to right lateral shin extending down to her toes. She's fallen a few times as her right foot and lower leg have become numb.   She has a history of carpel tunnel syndrome years ago which resolved on its own with bracing.   She has intermittent chronic mid lower back pain with radiation down her right lower extremity. This began during chemotherapy in 2019. Pain feels similar to symptoms of back pain during chemotherapy. She underwent an xray of her lower spine in 2020, no obvious abnormality.   She denies slurred speech, dizziness, abrupt visual changes.   BP Readings from Last 3 Encounters:  06/30/21 124/82  06/09/21 124/88  05/20/21 (!) 182/116      Review of Systems  Musculoskeletal:  Positive for arthralgias and back pain. Negative for joint swelling and neck pain.  Neurological:  Positive for numbness.        Past Medical History:  Diagnosis Date   Allergy    Anemia    Normocytic   Anxiety    Asthma    Blood  dyscrasia    Bronchitis 2005   CKD (chronic kidney disease)    CLASS 1-EXOPHTHALMOS-THYROTOXIC 02/08/2007   Diabetes mellitus without complication ([place])    Family history of breast cancer    Family history of lung cancer    Family history of prostate cancer    Gastroenteritis 07/10/07   GERD 07/24/2006   GRAVE'S DISEASE 01/01/2008   History of hidradenitis suppurativa    History of kidney stones    History of thrush    HIV DISEASE 07/24/2006   dx March 05   Hyperlipidemia    HYPERTENSION 07/24/2006   Hyperthyroidism 08/2006   Grave's Disease -diffuse radiotracer uptake 08/25/06 Thyroid scan-Cold nodule to R lower lobe of thyrorid   Menometrorrhagia    hx of   Nephrolithiasis    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 Completed 2 yrs Prednisone after lung bx confirmation. No symptoms since then.   Suppurative hidradenitis    Thyroid cancer ([place])    THYROID NODULE, RIGHT 02/08/2007    Social History  Socioeconomic History   Marital status: Single    Spouse name: Not on file   Number of children: 1   Years of education: Not on file   Highest education level: Not on file  Occupational History   Occupation: Secondary school teacher  Tobacco Use   Smoking status: Former    Packs/day: 0.50    Years: 15.00    Pack years: 7.50    Types: Cigarettes    Start date: 04/12/2017    Quit date: 2019    Years since quitting: 3.7   Smokeless tobacco: Never  Vaping Use   Vaping Use: Never used  Substance and Sexual Activity   Alcohol use: Yes    Alcohol/week: 0.0 standard drinks    Comment:  social   Drug use: No   Sexual activity: Not Currently    Birth control/protection: Post-menopausal    Comment: declined condoms  Other Topics Concern   Not on file  Social History Narrative   Not on file   Social Determinants of Health   Financial Resource Strain: Not on file  Food Insecurity: Not on file  Transportation Needs: Not on file  Physical Activity: Not on file  Stress: Not on file  Social Connections: Not on file  Intimate Partner Violence: Not on file    Past Surgical History:  Procedure Laterality Date   APPLICATION OF WOUND VAC N/A 01/20/2021   Procedure: APPLICATION OF WOUND VAC;  Surgeon: Wallace Going, DO;  Location: [facility];  Service: Plastics;  Laterality: N/A;   BREAST EXCISIONAL BIOPSY Right 04/26/2018   right axilla negative   BREAST EXCISIONAL BIOPSY Left 04/26/2018   left axilla negative   BREAST LUMPECTOMY Right 10/03/2018   malignant   BREAST LUMPECTOMY WITH RADIOACTIVE SEED AND SENTINEL LYMPH NODE BIOPSY Right 10/03/2018   Procedure: RIGHT BREAST LUMPECTOMY WITH RADIOACTIVE SEED AND SENTINEL LYMPH NODE MAPPING;  Surgeon: Erroll Luna, MD;  Location: [facility];  Service: General;  Laterality: Right;   BREAST SURGERY  1997   Breast Reduction    CYSTOSCOPY W/ URETERAL STENT REMOVAL  11/09/2012   Procedure: CYSTOSCOPY WITH STENT REMOVAL;  Surgeon: Alexis Frock, MD;  Location: [facility];  Service: Urology;  Laterality: Right;   CYSTOSCOPY WITH RETROGRADE PYELOGRAM, URETEROSCOPY AND STENT PLACEMENT  11/09/2012   Procedure: CYSTOSCOPY WITH RETROGRADE PYELOGRAM, URETEROSCOPY AND STENT PLACEMENT;  Surgeon: Alexis Frock, MD;  Location: [facility];  Service: Urology;  Laterality: Left;  LEFT URETEROSCOPY, STONE MANIPULATION, left STENT exchange    CYSTOSCOPY WITH STENT PLACEMENT  10/02/2012   Procedure: CYSTOSCOPY WITH STENT PLACEMENT;  Surgeon: Alexis Frock, MD;  Location: [facility];  Service: Urology;  Laterality: Left;   DEBRIDEMENT AND  CLOSURE WOUND N/A 01/20/2021   Procedure: Excision of abdominal wound with closure;  Surgeon: Wallace Going, DO;  Location: [facility];  Service: Plastics;  Laterality: N/A;   DILATION AND CURETTAGE OF UTERUS  Feb 2004   s/p for 1st trimester nonviable pregnancy   EYE SURGERY     sty under eyelid   INCISE AND DRAIN ABCESS  Nov 03   s/p I &D for righ inframmary fold hidradenitis   INCISION AND DRAINAGE PERITONSILLAR ABSCESS  Mar 03   IR CV LINE INJECTION  06/07/2018   IR IMAGING GUIDED PORT INSERTION  06/20/2018   IR REMOVAL TUN ACCESS W/ PORT W/O FL MOD SED  06/20/2018   IRRIGATION AND DEBRIDEMENT ABSCESS  01/31/2012   Procedure: IRRIGATION AND DEBRIDEMENT ABSCESS;  Surgeon: Shann Medal, MD;  Location: [facility];  Service: General;  Laterality: Right;  right breast and axilla    NEPHROLITHOTOMY  10/02/2012   Procedure: NEPHROLITHOTOMY PERCUTANEOUS;  Surgeon: Alexis Frock, MD;  Location: [facility];  Service: Urology;  Laterality: Right;  First Stage Percutaneous Nephrolithotomy with Surgeon Access, Left Ureteral Stent     NEPHROLITHOTOMY  10/04/2012   Procedure: NEPHROLITHOTOMY PERCUTANEOUS SECOND LOOK;  Surgeon: Alexis Frock, MD;  [facility]: [facility];  Service: Urology;  Laterality: Right;      NEPHROLITHOTOMY  10/08/2012   Procedure: NEPHROLITHOTOMY PERCUTANEOUS;  Surgeon: Alexis Frock, MD;  [facility]: [facility];  Service: Urology;  Laterality: Right;  THIRD STAGE, nephrostomy tube exchange x 2   NEPHROLITHOTOMY  10/11/2012   Procedure: NEPHROLITHOTOMY PERCUTANEOUS SECOND LOOK;  Surgeon: Alexis Frock, MD;  Location: [facility];  Service: Urology;  Laterality: Right;  RIGHT 4 STAGE PERCUTANOUS NEPHROLITHOTOMY, right URETEROSCOPY WITH HOLMIUM LASER    PANNICULECTOMY N/A 12/21/2020   Procedure: PANNICULECTOMY;  Surgeon: Wallace Going, DO;  Location: [facility];  Service: Plastics;  Laterality: N/A;   PORT-A-CATH REMOVAL N/A 07/16/2020   Procedure: REMOVAL PORT-A-CATH;  Surgeon:  Erroll Luna, MD;  Location: [facility];  Service: General;  Laterality: N/A;   PORTACATH PLACEMENT Left 05/17/2018   Procedure: INSERTION PORT-A-CATH;  Surgeon: Coralie Keens, MD;  Location: [facility];  Service: General;  Laterality: Left;   RADICAL NECK DISSECTION  04/12/2017   limited/notes 04/12/2017   RADICAL NECK DISSECTION N/A 04/12/2017   Procedure: RADICAL NECK DISSECTION;  Surgeon: Melida Quitter, MD;  Location: [facility];  Service: ENT;  Laterality: N/A;  limited neck dissection 2 hours total   REDUCTION MAMMAPLASTY Bilateral 1998   RIGHT/LEFT HEART CATH AND CORONARY ANGIOGRAPHY N/A 03/12/2020   Procedure: RIGHT/LEFT HEART CATH AND CORONARY ANGIOGRAPHY;  Surgeon: Jolaine Artist, MD;  Location: [facility];  Service: Cardiovascular;  Laterality: N/A;   [place]  1994   THYROIDECTOMY  04/12/2017   completion/notes 04/12/2017   THYROIDECTOMY N/A 04/12/2017   Procedure: THYROIDECTOMY;  Surgeon: Melida Quitter, MD;  Location: [facility];  Service: ENT;  Laterality: N/A;  Completion Thyroidectomy   TOTAL THYROIDECTOMY  2010    Family History  Problem Relation Age of Onset   Hypertension Mother    Cancer Mother        laryngeal   Heart disease Mother        stent   Hypertension Father    Lung cancer Father 82       hx smoking   Heart disease Other    Hypertension Other    Stroke Other        Grandparent   Kidney disease Other        Grandparent   Diabetes Other        FH of Diabetes   Hypertension Sister    Cancer Maternal Uncle        Lung CA   Hypertension Brother    Hypertension Sister    Breast cancer Maternal Aunt 65   Breast cancer Paternal Aunt 49   Prostate cancer Paternal Uncle    Breast cancer Maternal Aunt        dx 60+   Breast cancer Paternal Aunt        dx 64's   Breast cancer Paternal Aunt        dx 50's   Prostate cancer Paternal Uncle    Lung cancer Paternal Uncle  Breast cancer Cousin 48   Breast cancer Cousin         dx <50   Breast cancer Cousin        dx <50   Breast cancer Cousin        dx <50   Colon cancer Neg Hx    Esophageal cancer Neg Hx    Rectal cancer Neg Hx    Stomach cancer Neg Hx     Allergies  Allergen Reactions   Genvoya [Elviteg-Cobic-Emtricit-Tenofaf] Hives   Lisinopril Cough    Current Outpatient Medications on File Prior to Visit  Medication Sig Dispense Refill   albuterol (VENTOLIN HFA) 108 (90 Base) MCG/ACT inhaler Inhale 2 puffs into the lungs every 6 (six) hours as needed for wheezing or shortness of breath. 8 g 2   allopurinol (ZYLOPRIM) 100 MG tablet TAKE 1 TABLET (100 MG TOTAL) BY MOUTH DAILY. FOR GOUT PREVENTION. 90 tablet 1   Blood Glucose Monitoring Suppl (FREESTYLE LITE) w/Device KIT Inject 1 each into the skin daily. 1 kit 0   candesartan (ATACAND) 32 MG tablet Take 1 tablet (32 mg total) by mouth daily. 30 tablet 11   carvedilol (COREG) 25 MG tablet Take 1 tablet (25 mg total) by mouth 2 (two) times daily. 180 tablet 3   cetirizine (ZYRTEC) 10 MG tablet Take 1 tablet by mouth at bedtime for allergies. 90 tablet 0   Cholecalciferol (VITAMIN D) 50 MCG (2000 UT) tablet Take 4,000 Units by mouth daily.     dolutegravir ([name]) 50 MG tablet Take 1 tablet (50 mg total) by mouth daily. 30 tablet 11   empagliflozin (JARDIANCE) 10 MG TABS tablet Take 1 tablet (10 mg total) by mouth daily before breakfast. 30 tablet 6   emtricitabine-tenofovir AF (DESCOVY) 200-25 MG tablet Take 1 tablet by mouth daily. 30 tablet 11   famotidine (PEPCID) 20 MG tablet TAKE 1 OR 2 TABLETS BY MOUTH AT BEDTIME FOR HOARSE VOICE/REFLUX 180 tablet 1   FLOVENT HFA 110 MCG/ACT inhaler TAKE 1 PUFF BY MOUTH TWICE A DAY 36 Inhaler 1   fluconazole (DIFLUCAN) 100 MG tablet Take 1 tablet (100 mg total) by mouth daily. 7 tablet 2   gabapentin (NEURONTIN) 300 MG capsule Take 1 capsule (300 mg total) by mouth 3 (three) times daily. (Patient taking differently: Take 300 mg by mouth daily.) 90 capsule 3    glipiZIDE (GLUCOTROL) 5 MG tablet Take by mouth 2 (two) times daily. For diabetes.     glucose blood (ONETOUCH VERIO) test strip USE AS INSTRUCTED TO TEST BLOOD SUGAR DAILY 100 each 2   Lancets (ONETOUCH ULTRASOFT) lancets Use as instructed to test blood sugar daily 100 each 5   levothyroxine (SYNTHROID) 112 MCG tablet TAKE 1 TABLET BY MOUTH EVERY DAY BEFORE BREAKFAST 30 tablet 0   omeprazole (PRILOSEC) 40 MG capsule TAKE 1 CAPSULE (40 MG TOTAL) BY MOUTH DAILY. FOR REFLUX. 30 capsule 0   potassium chloride SA (KLOR-CON) 20 MEQ tablet Take 1 tablet (20 mEq total) by mouth 2 (two) times daily. For low potassium. 10 tablet 0   sertraline (ZOLOFT) 50 MG tablet TAKE 1 TABLET (50 MG TOTAL) BY MOUTH DAILY. FOR ANXIETY AND DEPRESSION. 90 tablet 1   sitaGLIPtin (JANUVIA) 50 MG tablet Take 1 tablet (50 mg total) by mouth daily. For diabetes. 90 tablet 1   spironolactone (ALDACTONE) 25 MG tablet Take 1 tablet (25 mg total) by mouth daily. 90 tablet 3   tamoxifen (NOLVADEX) 20 MG tablet TAKE 1  TABLET BY MOUTH EVERY DAY (Patient taking differently: Take 20 mg by mouth daily.) 90 tablet 4   rosuvastatin (CRESTOR) 10 MG tablet TAKE 1 TABLET BY MOUTH EVERY DAY FOR CHOLESTEROL (Patient taking differently: Take 10 mg by mouth daily.) 90 tablet 3   [DISCONTINUED] prochlorperazine (COMPAZINE) 10 MG tablet TAKE 1 TABLET BY MOUTH EVERY 6 HOURS AS NEEDED FOR NAUSEA OR VOMITING 30 tablet 2   Current Facility-Administered Medications on File Prior to Visit  Medication Dose Route Frequency Provider Last Rate Last Admin   sodium chloride flush (NS) 0.9 % injection 10 mL  10 mL Intracatheter PRN Magrinat, Virgie Dad, MD   10 mL at 09/19/18 1551    BP 124/82   Pulse 72   Temp (!) 97.4 F (36.3 C) (Temporal)   Ht _0  (1.651 m)   Wt 227 lb (103 kg)   LMP 03/31/2014 (LMP Unknown)   SpO2 99%   BMI 37.77 kg/m  Objective:   Physical Exam Neck:     Thyroid: No thyromegaly.  Cardiovascular:     Rate and Rhythm:  Normal rate and regular rhythm.     Heart sounds: No murmur heard. Pulmonary:     Effort: Pulmonary effort is normal.     Breath sounds: Normal breath sounds. No rales.  Musculoskeletal:     Cervical back: Neck supple.     Lumbar back: No tenderness or bony tenderness. Normal range of motion. Negative right straight leg raise test and negative left straight leg raise test.       Back:  Lymphadenopathy:     Cervical: No cervical adenopathy.  Skin:    General: Skin is warm and dry.     Findings: No rash.  Neurological:     Mental Status: She is alert and oriented to person, place, and time.     Cranial Nerves: No cranial nerve deficit.     Deep Tendon Reflexes: Reflexes are normal and symmetric.          Assessment & Plan:      This visit occurred during the SARS-CoV-2 public health emergency.  Safety protocols were in place, including screening questions prior to the visit, additional usage of staff PPE, and extensive cleaning of exam room while observing appropriate contact time as indicated for disinfecting solutions.

## 2021-06-30 NOTE — Assessment & Plan Note (Signed)
Reviewed plain films from 2020, overall negative. Repeat plain films pending today.  No alarm signs on exam.

## 2021-06-30 NOTE — Assessment & Plan Note (Signed)
Chronic for what sounds like years but more constant over last 3-4 months. Suspect chemotherapy to have contributed but also need to rule out other causes.   Checking labs including B12. Referral placed to neurology for EMG testing and evaluation.

## 2021-06-30 NOTE — Addendum Note (Signed)
Addended by: Ellamae Sia on: 06/30/2021 01:03 PM   Modules accepted: Orders

## 2021-06-30 NOTE — Assessment & Plan Note (Signed)
Checking uric acid level today given chronic right hand pain. Compliant to allopurinol 100 mg daily.

## 2021-07-01 ENCOUNTER — Encounter: Payer: Self-pay | Admitting: Neurology

## 2021-07-01 ENCOUNTER — Other Ambulatory Visit (INDEPENDENT_AMBULATORY_CARE_PROVIDER_SITE_OTHER): Payer: 59

## 2021-07-01 ENCOUNTER — Ambulatory Visit (INDEPENDENT_AMBULATORY_CARE_PROVIDER_SITE_OTHER): Payer: 59 | Admitting: Endocrinology

## 2021-07-01 ENCOUNTER — Other Ambulatory Visit: Payer: Self-pay

## 2021-07-01 VITALS — BP 140/78 | HR 76 | Ht 65.0 in | Wt 229.8 lb

## 2021-07-01 DIAGNOSIS — R202 Paresthesia of skin: Secondary | ICD-10-CM

## 2021-07-01 DIAGNOSIS — E890 Postprocedural hypothyroidism: Secondary | ICD-10-CM

## 2021-07-01 DIAGNOSIS — M1A09X0 Idiopathic chronic gout, multiple sites, without tophus (tophi): Secondary | ICD-10-CM

## 2021-07-01 DIAGNOSIS — R591 Generalized enlarged lymph nodes: Secondary | ICD-10-CM | POA: Insufficient documentation

## 2021-07-01 DIAGNOSIS — M79641 Pain in right hand: Secondary | ICD-10-CM | POA: Diagnosis not present

## 2021-07-01 DIAGNOSIS — G8929 Other chronic pain: Secondary | ICD-10-CM | POA: Diagnosis not present

## 2021-07-01 LAB — URIC ACID: Uric Acid, Serum: 5.9 mg/dL (ref 2.4–7.0)

## 2021-07-01 LAB — TSH: TSH: 23.96 u[IU]/mL — ABNORMAL HIGH (ref 0.35–5.50)

## 2021-07-01 LAB — T4, FREE: Free T4: 0.75 ng/dL (ref 0.60–1.60)

## 2021-07-01 LAB — VITAMIN B12: Vitamin B-12: 413 pg/mL (ref 211–911)

## 2021-07-01 LAB — SEDIMENTATION RATE: Sed Rate: 82 mm/hr — ABNORMAL HIGH (ref 0–30)

## 2021-07-01 MED ORDER — SYNTHROID 150 MCG PO TABS: 150.0000 ug | ORAL_TABLET | Freq: Every day | ORAL | 1 refills | Status: DC

## 2021-07-01 NOTE — Patient Instructions (Addendum)
Blood tests are requested for you today.  We'll let you know about the results.  °Let's recheck the ultrasound.  you will receive a phone call, about a day and time for an appointment. °It is best to never miss the medication.  However, if you do miss it, next best is to double up the next time.   °Please come back for a follow-up appointment in 4 months.   °

## 2021-07-01 NOTE — Progress Notes (Signed)
Subjective:    Patient ID: [name], female    DOB: [birth year], 55 y.o.   MRN: [id number]  HPI Pt returns for f/u of postsurgical hypothyroidism.  2007 thyr NM scan consistent with Graves disease, except for 1 cold area 2008 Dominant 2.5 cm solid nodule in the inferior right lobe, corresponding to cold defect on NM. 2008 thyroid bx was suspicious.  2009 thyroidect--pathol c/w Grave's dz, with adenomatous nodule 2/18 CT Multiple enlarged bilateral cervical lymph nodes. Residual 10 mm nodule at the thyroidectomy bed with similar nodules superior to the thyroid on the left, concerning for residual or recurrent thyroid tissue. Neoplasm is not excluded.  Single low-density noted in the posterior left level 2 station of undetermined significance. 3/18 LYMPH NODE , FNA, LEFT CERVICAL, STRONGLY FAVORS METASTATIC PTC 3/18 THYROID, FINE NEEDLE ASPIRATION, LEFT B[place] CATEGORY II 5/18 NM scan: Expected tissue within the thyroid bed. There is a second adjacent smaller focus of less intense uptake. 6/18 left thyroid lobectomy, and LN's, Left zone 3 Neck Contents.  NO MALIGNANCY 8/18 NM scan: abnormal focus in the right side of neck, suspicious for nodal metastasis 9/18 [place]: 1 non pathologically enlarged lymph node.  10/21 [place]: normal (thyroidect) Other: She requests brand name Synthroid.   Interval hx: pt reports ongoing sob and doe.   Past Medical History:  Diagnosis Date   Allergy    Anemia    Normocytic   Anxiety    Asthma    Blood dyscrasia    Bronchitis 2005   CKD (chronic kidney disease)    CLASS 1-EXOPHTHALMOS-THYROTOXIC 02/08/2007   Diabetes mellitus without complication ([place])    Family history of breast cancer    Family history of lung cancer    Family history of prostate cancer    Gastroenteritis 07/10/07   GERD 07/24/2006   GRAVE'S DISEASE 01/01/2008   History of hidradenitis suppurativa    History of kidney stones    History of thrush    HIV DISEASE 07/24/2006   dx  March 05   Hyperlipidemia    HYPERTENSION 07/24/2006   Hyperthyroidism 08/2006   Grave's Disease -diffuse radiotracer uptake 08/25/06 Thyroid scan-Cold nodule to R lower lobe of thyrorid   Menometrorrhagia    hx of   Nephrolithiasis    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   Personal history of chemotherapy    2020   Personal history of radiation therapy    2020   Pneumonia 2005   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   Sarcoidosis 02/08/2007   dx as a teenager in [place] from abnl CXR. Completed 2 yrs Prednisone after lung bx confirmation. No symptoms since then.   Suppurative hidradenitis    Thyroid cancer ([place])    THYROID NODULE, RIGHT 02/08/2007    Past Surgical History:  Procedure Laterality Date   APPLICATION OF WOUND VAC N/A 01/20/2021   Procedure: APPLICATION OF WOUND VAC;  Surgeon: Wallace Going, DO;  Location: [facility];  Service: Plastics;  Laterality: N/A;   BREAST EXCISIONAL BIOPSY Right 04/26/2018   right axilla negative   BREAST EXCISIONAL BIOPSY Left 04/26/2018   left axilla negative   BREAST LUMPECTOMY Right 10/03/2018   malignant   BREAST LUMPECTOMY WITH RADIOACTIVE SEED AND SENTINEL LYMPH NODE BIOPSY Right 10/03/2018   Procedure: RIGHT BREAST LUMPECTOMY WITH RADIOACTIVE SEED AND SENTINEL LYMPH NODE  MAPPING;  Surgeon: Erroll Luna, MD;  Location: [facility];  Service: General;  Laterality: Right;   BREAST SURGERY  1997   Breast Reduction    CYSTOSCOPY W/ URETERAL STENT REMOVAL  11/09/2012   Procedure: CYSTOSCOPY WITH STENT REMOVAL;  Surgeon: Alexis Frock, MD;  Location[facility];  Service: Urology;  Laterality: Right;   CYSTOSCOPY WITH RETROGRADE PYELOGRAM, URETEROSCOPY AND STENT PLACEMENT  11/09/2012   Procedure: CYSTOSCOPY WITH RETROGRADE PYELOGRAM,  URETEROSCOPY AND STENT PLACEMENT;  Surgeon: Alexis Frock, MD;  Location: [facility];  Service: Urology;  Laterality: Left;  LEFT URETEROSCOPY, STONE MANIPULATION, left STENT exchange    CYSTOSCOPY WITH STENT PLACEMENT  10/02/2012   Procedure: CYSTOSCOPY WITH STENT PLACEMENT;  Surgeon: Alexis Frock, MD;  Location: [facility];  Service: Urology;  Laterality: Left;   DEBRIDEMENT AND CLOSURE WOUND N/A 01/20/2021   Procedure: Excision of abdominal wound with closure;  Surgeon: Wallace Going, DO;  Location: [facility];  Service: Plastics;  Laterality: N/A;   DILATION AND CURETTAGE OF UTERUS  Feb 2004   s/p for 1st trimester nonviable pregnancy   EYE SURGERY     sty under eyelid   INCISE AND DRAIN ABCESS  Nov 03   s/p I &D for righ inframmary fold hidradenitis   INCISION AND DRAINAGE PERITONSILLAR ABSCESS  Mar 03   IR CV LINE INJECTION  06/07/2018   IR IMAGING GUIDED PORT INSERTION  06/20/2018   IR REMOVAL TUN ACCESS W/ PORT W/O FL MOD SED  06/20/2018   IRRIGATION AND DEBRIDEMENT ABSCESS  01/31/2012   Procedure: IRRIGATION AND DEBRIDEMENT ABSCESS;  Surgeon: Shann Medal, MD;  Location[facility];  Service: General;  Laterality: Right;  right breast and axilla    NEPHROLITHOTOMY  10/02/2012   Procedure: NEPHROLITHOTOMY PERCUTANEOUS;  Surgeon: Alexis Frock, MD;  Location[facility];  Service: Urology;  Laterality: Right;  First Stage Percutaneous Nephrolithotomy with Surgeon Access, Left Ureteral Stent     NEPHROLITHOTOMY  10/04/2012   Procedure: NEPHROLITHOTOMY PERCUTANEOUS SECOND LOOK;  Surgeon: Alexis Frock, MD;  [facility];  Service: Urology;  Laterality: Right;      NEPHROLITHOTOMY  10/08/2012   Procedure: NEPHROLITHOTOMY PERCUTANEOUS;  Surgeon: Alexis Frock, MD;  [facility];  Service: Urology;  Laterality: Right;  THIRD STAGE, nephrostomy tube exchange x 2   NEPHROLITHOTOMY  10/11/2012   Procedure: NEPHROLITHOTOMY PERCUTANEOUS SECOND LOOK;  Surgeon: Alexis Frock, MD;   [facility];  Service: Urology;  Laterality: Right;  RIGHT 4 STAGE PERCUTANOUS NEPHROLITHOTOMY, right URETEROSCOPY WITH HOLMIUM LASER    PANNICULECTOMY N/A 12/21/2020   Procedure: PANNICULECTOMY;  Surgeon: Wallace Going, DO;  Location: [facility];  Service: Plastics;  Laterality: N/A;   PORT-A-CATH REMOVAL N/A 07/16/2020   Procedure: REMOVAL PORT-A-CATH;  Surgeon: Erroll Luna, MD;  Location: [facility];  Service: General;  Laterality: N/A;   PORTACATH PLACEMENT Left 05/17/2018   Procedure: INSERTION PORT-A-CATH;  Surgeon: Coralie Keens, MD;  Location: [facility];  Service: General;  Laterality: Left;   RADICAL NECK DISSECTION  04/12/2017   limited/notes 04/12/2017   RADICAL NECK DISSECTION N/A 04/12/2017   Procedure: RADICAL NECK DISSECTION;  Surgeon: Melida Quitter, MD;  Location: [facility];  Service: ENT;  Laterality: N/A;  limited neck dissection 2 hours total   REDUCTION MAMMAPLASTY Bilateral 1998   RIGHT/LEFT HEART CATH AND CORONARY ANGIOGRAPHY N/A 03/12/2020   Procedure: RIGHT/LEFT HEART CATH AND CORONARY ANGIOGRAPHY;  Surgeon: Jolaine Artist, MD;  Location: [facility];  Service: Cardiovascular;  Laterality: N/A;   [place]  04/12/2017   completion/notes 04/12/2017   THYROIDECTOMY N/A 04/12/2017   Procedure: THYROIDECTOMY;  Surgeon: Melida Quitter, MD;  Location: [facility];  Service: ENT;  Laterality: N/A;  Completion Thyroidectomy   TOTAL THYROIDECTOMY  2010    Social History   Socioeconomic History   Marital status: Single    Spouse name: Not on file   Number of children: 1   Years of education: Not on file   Highest education level: Not on file  Occupational History   Occupation: Secondary school teacher  Tobacco Use   Smoking status: Former    Packs/day: 0.50    Years: 15.00    Pack years: 7.50    Types: Cigarettes    Start date: 04/12/2017    Quit date: 2019    Years since quitting: 3.7   Smokeless tobacco: Never   Vaping Use   Vaping Use: Never used  Substance and Sexual Activity   Alcohol use: Yes    Alcohol/week: 0.0 standard drinks    Comment: social   Drug use: No   Sexual activity: Not Currently    Birth control/protection: Post-menopausal    Comment: declined condoms  Other Topics Concern   Not on file  Social History Narrative   Not on file   Social Determinants of Health   Financial Resource Strain: Not on file  Food Insecurity: Not on file  Transportation Needs: Not on file  Physical Activity: Not on file  Stress: Not on file  Social Connections: Not on file  Intimate Partner Violence: Not on file    Current Outpatient Medications on File Prior to Visit  Medication Sig Dispense Refill   albuterol (VENTOLIN HFA) 108 (90 Base) MCG/ACT inhaler Inhale 2 puffs into the lungs every 6 (six) hours as needed for wheezing or shortness of breath. 8 g 2   allopurinol (ZYLOPRIM) 100 MG tablet TAKE 1 TABLET (100 MG TOTAL) BY MOUTH DAILY. FOR GOUT PREVENTION. 90 tablet 1   Blood Glucose Monitoring Suppl (FREESTYLE LITE) w/Device KIT Inject 1 each into the skin daily. 1 kit 0   candesartan (ATACAND) 32 MG tablet Take 1 tablet (32 mg total) by mouth daily. 30 tablet 11   carvedilol (COREG) 25 MG tablet Take 1 tablet (25 mg total) by mouth 2 (two) times daily. 180 tablet 3   cetirizine (ZYRTEC) 10 MG tablet Take 1 tablet by mouth at bedtime for allergies. 90 tablet 0   Cholecalciferol (VITAMIN D) 50 MCG (2000 UT) tablet Take 4,000 Units by mouth daily.     dolutegravir ([name]) 50 MG tablet Take 1 tablet (50 mg total) by mouth daily. 30 tablet 11   empagliflozin (JARDIANCE) 10 MG TABS tablet Take 1 tablet (10 mg total) by mouth daily before breakfast. 30 tablet 6   emtricitabine-tenofovir AF (DESCOVY) 200-25 MG tablet Take 1 tablet by mouth daily. 30 tablet 11   famotidine (PEPCID) 20 MG tablet TAKE 1 OR 2 TABLETS BY MOUTH AT BEDTIME FOR HOARSE VOICE/REFLUX 180 tablet 1   FLOVENT HFA 110  MCG/ACT inhaler TAKE 1 PUFF BY MOUTH TWICE A DAY 36 Inhaler 1   fluconazole (DIFLUCAN) 100 MG tablet Take 1 tablet (100 mg total) by mouth daily. 7 tablet 2   gabapentin (NEURONTIN) 300 MG capsule Take 1 capsule (300 mg total) by mouth 3 (three) times daily. (Patient taking differently: Take 300 mg by mouth daily.) 90 capsule 3  glipiZIDE (GLUCOTROL) 5 MG tablet Take by mouth 2 (two) times daily. For diabetes.     glucose blood (ONETOUCH VERIO) test strip USE AS INSTRUCTED TO TEST BLOOD SUGAR DAILY 100 each 2   Lancets (ONETOUCH ULTRASOFT) lancets Use as instructed to test blood sugar daily 100 each 5   omeprazole (PRILOSEC) 40 MG capsule TAKE 1 CAPSULE (40 MG TOTAL) BY MOUTH DAILY. FOR REFLUX. 30 capsule 0   potassium chloride SA (KLOR-CON) 20 MEQ tablet Take 1 tablet (20 mEq total) by mouth 2 (two) times daily. For low potassium. 10 tablet 0   rosuvastatin (CRESTOR) 10 MG tablet TAKE 1 TABLET BY MOUTH EVERY DAY FOR CHOLESTEROL (Patient taking differently: Take 10 mg by mouth daily.) 90 tablet 3   sertraline (ZOLOFT) 50 MG tablet TAKE 1 TABLET (50 MG TOTAL) BY MOUTH DAILY. FOR ANXIETY AND DEPRESSION. 90 tablet 1   sitaGLIPtin (JANUVIA) 50 MG tablet Take 1 tablet (50 mg total) by mouth daily. For diabetes. 90 tablet 1   spironolactone (ALDACTONE) 25 MG tablet Take 1 tablet (25 mg total) by mouth daily. 90 tablet 3   tamoxifen (NOLVADEX) 20 MG tablet TAKE 1 TABLET BY MOUTH EVERY DAY (Patient taking differently: Take 20 mg by mouth daily.) 90 tablet 4   [DISCONTINUED] prochlorperazine (COMPAZINE) 10 MG tablet TAKE 1 TABLET BY MOUTH EVERY 6 HOURS AS NEEDED FOR NAUSEA OR VOMITING 30 tablet 2   Current Facility-Administered Medications on File Prior to Visit  Medication Dose Route Frequency Provider Last Rate Last Admin   sodium chloride flush (NS) 0.9 % injection 10 mL  10 mL Intracatheter PRN Magrinat, Virgie Dad, MD   10 mL at 09/19/18 1551    Allergies  Allergen Reactions   Genvoya  [Elviteg-Cobic-Emtricit-Tenofaf] Hives   Lisinopril Cough    Family History  Problem Relation Age of Onset   Hypertension Mother    Cancer Mother        laryngeal   Heart disease Mother        stent   Hypertension Father    Lung cancer Father 38       hx smoking   Heart disease Other    Hypertension Other    Stroke Other        Grandparent   Kidney disease Other        Grandparent   Diabetes Other        FH of Diabetes   Hypertension Sister    Cancer Maternal Uncle        Lung CA   Hypertension Brother    Hypertension Sister    Breast cancer Maternal Aunt 65   Breast cancer Paternal Aunt 32   Prostate cancer Paternal Uncle    Breast cancer Maternal Aunt        dx 60+   Breast cancer Paternal Aunt        dx 34's   Breast cancer Paternal Aunt        dx 50's   Prostate cancer Paternal Uncle    Lung cancer Paternal Uncle    Breast cancer Cousin 44   Breast cancer Cousin        dx <50   Breast cancer Cousin        dx <50   Breast cancer Cousin        dx <50   Colon cancer Neg Hx    Esophageal cancer Neg Hx    Rectal cancer Neg Hx    Stomach cancer Neg Hx  BP 140/78 (BP Location: Right Arm, Patient Position: Sitting, Cuff Size: Large)   Pulse 76   Ht $R'5\' 5"'Sa$  (1.651 m)   Wt 229 lb 12.8 oz (104.2 kg)   LMP 03/31/2014 (LMP Unknown)   SpO2 97%   BMI 38.24 kg/m   Review of Systems     Objective:   Physical Exam Neck: a healed scar is present.  I do not appreciate a nodule in the thyroid or elsewhere in the neck.      Lab Results  Component Value Date   CREATININE 2.74 (H) 03/17/2021   BUN 19 03/17/2021   NA 138 03/17/2021   K 4.4 03/17/2021   CL 109 03/17/2021   CO2 24 03/17/2021   Lab Results  Component Value Date   TSH 23.96 (H) 07/01/2021      Assessment & Plan:  Hypothyroidism: uncontrolled.  I have sent a prescription to your pharmacy, to increase synthroid.   PTC: recheck today  Patient Instructions  Blood tests are requested for you  today.  We'll let you know about the results.  Let's recheck the ultrasound.  you will receive a phone call, about a day and time for an appointment. It is best to never miss the medication.  However, if you do miss it, next best is to double up the next time.   Please come back for a follow-up appointment in 4 months.

## 2021-07-03 LAB — RHEUMATOID FACTOR: Rheumatoid fact SerPl-aCnc: <14 IU/mL (ref <14)

## 2021-07-03 LAB — ANA: Anti Nuclear Antibody (ANA): NEGATIVE

## 2021-07-03 LAB — CYCLIC CITRUL PEPTIDE ANTIBODY, IGG: Cyclic Citrullin Peptide Ab: <16 UNITS

## 2021-07-06 ENCOUNTER — Ambulatory Visit: Payer: 59

## 2021-07-13 ENCOUNTER — Ambulatory Visit: Payer: 59

## 2021-07-13 ENCOUNTER — Other Ambulatory Visit: Payer: Self-pay

## 2021-07-15 ENCOUNTER — Encounter (HOSPITAL_COMMUNITY): Payer: 59 | Admitting: Internal Medicine

## 2021-07-15 ENCOUNTER — Encounter: Payer: Self-pay | Admitting: Oncology

## 2021-07-15 ENCOUNTER — Ambulatory Visit
Admission: RE | Admit: 2021-07-15 | Discharge: 2021-07-15 | Disposition: A | Discharge status: Home / Self Care | Payer: 59 | Source: Ambulatory Visit | Attending: Endocrinology | Admitting: Endocrinology

## 2021-07-15 DIAGNOSIS — R591 Generalized enlarged lymph nodes: Secondary | ICD-10-CM

## 2021-07-16 ENCOUNTER — Other Ambulatory Visit: Payer: Self-pay | Admitting: Endocrinology

## 2021-07-19 ENCOUNTER — Other Ambulatory Visit: Payer: Self-pay

## 2021-07-19 ENCOUNTER — Telehealth (INDEPENDENT_AMBULATORY_CARE_PROVIDER_SITE_OTHER): Payer: 59 | Admitting: Nurse Practitioner

## 2021-07-19 VITALS — Temp 98.3°F

## 2021-07-19 DIAGNOSIS — R051 Acute cough: Secondary | ICD-10-CM | POA: Insufficient documentation

## 2021-07-19 DIAGNOSIS — J069 Acute upper respiratory infection, unspecified: Secondary | ICD-10-CM | POA: Diagnosis not present

## 2021-07-19 MED ORDER — FLUTICASONE PROPIONATE 50 MCG/ACT NA SUSP: 2.0000 | Freq: Every day | NASAL | 6 refills | Status: DC

## 2021-07-19 MED ORDER — CETIRIZINE HCL 10 MG PO TABS: ORAL_TABLET | ORAL | 0 refills | Status: DC

## 2021-07-19 MED ORDER — HYDROCODONE BIT-HOMATROP MBR 5-1.5 MG/5ML PO SOLN: 5.0000 mL | Freq: Two times a day (BID) | ORAL | 0 refills | Status: AC | PRN

## 2021-07-19 NOTE — Progress Notes (Signed)
Patient ID: [name], female    DOB: January 12, 1966, 55 y.o.   MRN: [id number]  Virtual visit completed through [employer], a video enabled telemedicine application. Due to national recommendations of social distancing due to COVID-19, a virtual visit is felt to be most appropriate for this patient at this time. Reviewed limitations, risks, security and privacy concerns of performing a virtual visit and the availability of in person appointments. I also reviewed that there may be a patient responsible charge related to this service. The patient agreed to proceed.   Patient location: home Provider location: [place] at [facility], office Persons participating in this virtual visit: patient, provider   If any vitals were documented, they were collected by patient at home unless specified below.    Temp 98.3 F (36.8 C) Comment: per patient  LMP 03/31/2014 (LMP Unknown)    CC: Sneezing, nasal congestion, coughing  Subjective:   HPI: [name] is a 55 y.o. female presenting on 07/19/2021 for Nasal Congestion (Started on 07/14/21 with scratchy throat, sneezing, runny nose, nasal congestion. Now having cough-thick mucus and phlegm, back hurts from coughing, some wheezing at night, sweats were present on 07/17/21. No body aches or chills. Covid test was negative on 07/18/21. )    07/14/2021 symptoms began [place] vaccine x2, At home test covid test was negative on 07/18/2021 Has been taking mucinex and emergen-c, tylenol, flonase Symptoms have been improving.    Relevant past medical, surgical, family and social history reviewed and updated as indicated. Interim medical history since our last visit reviewed. Allergies and medications reviewed and updated. Outpatient Medications Prior to Visit  Medication Sig Dispense Refill   albuterol (VENTOLIN HFA) 108 (90 Base) MCG/ACT inhaler Inhale 2 puffs into the lungs every 6 (six) hours as needed for wheezing or shortness of  breath. 8 g 2   allopurinol (ZYLOPRIM) 100 MG tablet TAKE 1 TABLET (100 MG TOTAL) BY MOUTH DAILY. FOR GOUT PREVENTION. 90 tablet 1   Blood Glucose Monitoring Suppl (FREESTYLE LITE) w/Device KIT Inject 1 each into the skin daily. 1 kit 0   candesartan (ATACAND) 32 MG tablet Take 1 tablet (32 mg total) by mouth daily. 30 tablet 11   carvedilol (COREG) 25 MG tablet Take 1 tablet (25 mg total) by mouth 2 (two) times daily. 180 tablet 3   cetirizine (ZYRTEC) 10 MG tablet Take 1 tablet by mouth at bedtime for allergies. 90 tablet 0   Cholecalciferol (VITAMIN D) 50 MCG (2000 UT) tablet Take 4,000 Units by mouth daily.     dolutegravir ([name]) 50 MG tablet Take 1 tablet (50 mg total) by mouth daily. 30 tablet 11   doxycycline (VIBRA-TABS) 100 MG tablet Take 100 mg by mouth 2 (two) times daily.     empagliflozin (JARDIANCE) 10 MG TABS tablet Take 1 tablet (10 mg total) by mouth daily before breakfast. 30 tablet 6   emtricitabine-tenofovir AF (DESCOVY) 200-25 MG tablet Take 1 tablet by mouth daily. 30 tablet 11   famotidine (PEPCID) 20 MG tablet TAKE 1 OR 2 TABLETS BY MOUTH AT BEDTIME FOR HOARSE VOICE/REFLUX 180 tablet 1   FLOVENT HFA 110 MCG/ACT inhaler TAKE 1 PUFF BY MOUTH TWICE A DAY 36 Inhaler 1   gabapentin (NEURONTIN) 300 MG capsule Take 1 capsule (300 mg total) by mouth 3 (three) times daily. (Patient taking differently: Take 300 mg by mouth daily.) 90 capsule 3   glipiZIDE (GLUCOTROL) 5 MG tablet Take by mouth 2 (two) times daily. For  diabetes.     glucose blood (ONETOUCH VERIO) test strip USE AS INSTRUCTED TO TEST BLOOD SUGAR DAILY 100 each 2   Lancets (ONETOUCH ULTRASOFT) lancets Use as instructed to test blood sugar daily 100 each 5   omeprazole (PRILOSEC) 40 MG capsule TAKE 1 CAPSULE (40 MG TOTAL) BY MOUTH DAILY. FOR REFLUX. 30 capsule 0   potassium chloride SA (KLOR-CON) 20 MEQ tablet Take 1 tablet (20 mEq total) by mouth 2 (two) times daily. For low potassium. 10 tablet 0   rosuvastatin  (CRESTOR) 10 MG tablet TAKE 1 TABLET BY MOUTH EVERY DAY FOR CHOLESTEROL (Patient taking differently: Take 10 mg by mouth daily.) 90 tablet 3   sertraline (ZOLOFT) 50 MG tablet TAKE 1 TABLET (50 MG TOTAL) BY MOUTH DAILY. FOR ANXIETY AND DEPRESSION. 90 tablet 1   sitaGLIPtin (JANUVIA) 50 MG tablet Take 1 tablet (50 mg total) by mouth daily. For diabetes. 90 tablet 1   spironolactone (ALDACTONE) 25 MG tablet Take 1 tablet (25 mg total) by mouth daily. 90 tablet 3   SYNTHROID 150 MCG tablet Take 1 tablet (150 mcg total) by mouth daily before breakfast. 90 tablet 1   tamoxifen (NOLVADEX) 20 MG tablet TAKE 1 TABLET BY MOUTH EVERY DAY (Patient taking differently: Take 20 mg by mouth daily.) 90 tablet 4   fluconazole (DIFLUCAN) 100 MG tablet Take 1 tablet (100 mg total) by mouth daily. 7 tablet 2   Facility-Administered Medications Prior to Visit  Medication Dose Route Frequency Provider Last Rate Last Admin   sodium chloride flush (NS) 0.9 % injection 10 mL  10 mL Intracatheter PRN Magrinat, Virgie Dad, MD   10 mL at 09/19/18 1551     Per HPI unless specifically indicated in ROS section below Review of Systems  Constitutional:  Positive for fatigue and fever. Negative for chills.  HENT:  Positive for congestion and postnasal drip. Negative for ear discharge, ear pain and sore throat.   Respiratory:  Positive for cough (dry) and shortness of breath.   Cardiovascular:  Negative for chest pain.  Gastrointestinal:  Negative for diarrhea, nausea and vomiting.  Musculoskeletal:  Negative for arthralgias and myalgias.  Neurological:  Positive for headaches.  Objective:  Temp 98.3 F (36.8 C) Comment: per patient  LMP 03/31/2014 (LMP Unknown)   Wt Readings from Last 3 Encounters:  07/01/21 229 lb 12.8 oz (104.2 kg)  06/30/21 227 lb (103 kg)  06/09/21 227 lb (103 kg)       Physical exam: Gen: alert, NAD, not ill appearing Pulm: speaks in complete sentences without increased work of breathing Psych:  normal mood, normal thought content      Results for orders placed or performed in visit on 07/01/21  TSH  Result Value Ref Range   TSH 23.96 (H) 0.35 - 5.50 uIU/mL  T4, free  Result Value Ref Range   Free T4 0.75 0.60 - 1.60 ng/dL   *Note: Due to a large number of results and/or encounters for the requested time period, some results have not been displayed. A complete set of results can be found in Results Review.   Assessment & Plan:   Problem List Items Addressed This Visit       Respiratory   Viral upper respiratory illness - Primary    Likely viral in nature.  Patient is currently on HIV medications virus is undetectable.  CD4 counts were over 600 last time was checked.  Does see Dr. Michel Bickers.  Patient is on doxycycline 100  mg twice daily per patient report she is taking it once daily.  Did send a message to Dr. [name] to verify doxycycline usage and if it would be a low threshold to change antibiotics for patient's condition.  Given that her symptoms are improving still side with it being bilateral.  We will send in medications for symptomatic treatment.  Continue taking over-the-counter medications that you were for your symptoms.      Relevant Medications   cetirizine (ZYRTEC) 10 MG tablet   fluticasone (FLONASE) 50 MCG/ACT nasal spray   HYDROcodone bit-homatropine (HYDROMET) 5-1.5 MG/5ML syrup     Other   Acute cough    Patient's big complaint is cough.  She has been using over-the-counter regimens without much relief.  Did have a leftover prescription for Tessalon Perles unaffected.  States she is had cough syrup in the past that was beneficial.  Did check PDMP and noted she had some back in December 2021.  She had Hydromet and has done well.  We will refill that at this time.  Continue to monitor      Relevant Medications   HYDROcodone bit-homatropine (HYDROMET) 5-1.5 MG/5ML syrup     No orders of the defined types were placed in this encounter.  No orders of  the defined types were placed in this encounter.   I discussed the assessment and treatment plan with the patient. The patient was provided an opportunity to ask questions and all were answered. The patient agreed with the plan and demonstrated an understanding of the instructions. The patient was advised to call back or seek an in-person evaluation if the symptoms worsen or if the condition fails to improve as anticipated.  Follow up plan: No follow-ups on file.  Romilda Garret, NP

## 2021-07-19 NOTE — Assessment & Plan Note (Signed)
Patient's big complaint is cough.  She has been using over-the-counter regimens without much relief.  Did have a leftover prescription for Tessalon Perles unaffected.  States she is had cough syrup in the past that was beneficial.  Did check PDMP and noted she had some back in December 2021.  She had Hydromet and has done well.  We will refill that at this time.  Continue to monitor

## 2021-07-19 NOTE — Assessment & Plan Note (Signed)
Likely viral in nature.  Patient is currently on HIV medications virus is undetectable.  CD4 counts were over 600 last time was checked.  Does see Dr. Michel Bickers.  Patient is on doxycycline 100 mg twice daily per patient report she is taking it once daily.  Did send a message to Dr. [name] to verify doxycycline usage and if it would be a low threshold to change antibiotics for patient's condition.  Given that her symptoms are improving still side with it being bilateral.  We will send in medications for symptomatic treatment.  Continue taking over-the-counter medications that you were for your symptoms.

## 2021-07-20 ENCOUNTER — Ambulatory Visit: Payer: 59

## 2021-07-22 ENCOUNTER — Other Ambulatory Visit: Payer: Self-pay | Admitting: Primary Care

## 2021-07-22 DIAGNOSIS — K219 Gastro-esophageal reflux disease without esophagitis: Secondary | ICD-10-CM

## 2021-07-22 DIAGNOSIS — R490 Dysphonia: Secondary | ICD-10-CM

## 2021-07-26 ENCOUNTER — Other Ambulatory Visit: Payer: Self-pay | Admitting: *Deleted

## 2021-07-26 DIAGNOSIS — C50311 Malignant neoplasm of lower-inner quadrant of right female breast: Secondary | ICD-10-CM

## 2021-07-26 NOTE — Progress Notes (Signed)
[facility]  [phone] [phone]    ID: [name] DOB: August 13, 1966  MR#: [id number]  VKX#:[id number]  Patient Care Team: Doreene Nest, NP as PCP - General (Internal Medicine) Cliffton Asters, MD as PCP - Infectious Diseases (Infectious Diseases) Garlon Tuggle, Valentino Hue, MD as Consulting Physician (Oncology) Dorothy Puffer, MD as Consulting Physician (Radiation Oncology) Harriette Bouillon, MD as Consulting Physician (General Surgery) Sebastian Ache, MD as Consulting Physician (Urology) Bensimhon, Bevelyn Buckles, MD as Consulting Physician (Cardiology) Zetta Bills, MD as Consulting Physician (Nephrology) Romero Belling, MD as Consulting Physician (Endocrinology) OTHER MD:   CHIEF COMPLAINT: Triple positive breast cancer  CURRENT TREATMENT: tamoxifen   INTERVAL HISTORY: [name] returns today for follow-up of her triple positive breast cancer.    She continues on tamoxifen.  She has not flashes but she is learned to live with them.    Her most recent mammography was on 07/06/2020.  She tells me she is scheduled to have a repeat in November but I do not find that in her chart.  Of note since her last visit, she underwent surveillance thyroid ultrasound on 07/15/2021 showing: no evidence of residual or recurrent thyroid tissue; bilateral prominent and borderline-enlarged cervical chain lymph nodes, one of which remains unchanged and at least one additional new node on both left and right. Clinical surveillance recommended.   REVIEW OF SYSTEMS: [name] is having some pain below the knee particularly in the right shin area, but also in the right knee and hip.  She was prescribed prednisone with some relief but of course that she is concerned about weight gain and swelling with that drug.  She has had a "cold" for about 2weeks, with significant phlegm production.  She says she has had wheezing and shortness of breath and it was just like the last time she had  COVID but she says the home test was negative.  She has been given Zyrtec for the mucus but it has not helped very much.  A detailed review of systems was otherwise stable.   COVID 19 VACCINATION STATUS: Pfizer x2, most recently 01/2020   HISTORY OF CURRENT ILLNESS: From the original intake note:  [name] (pronounced "Huff") palpated a mass in the right breast for about 2 weeks before bringing it to medical attention. She underwent bilateral diagnostic mammography with tomography and right breast ultrasonography at [facility] on 04/20/2018 showing: breast density category B. There was a highly suspicious mass located at 3 o'clock in the upper inner quadrant measuring 2.9 x 2.5 x 1.6 cm and 4 cm from the nipple. Ultrasound of the right axilla showed 5 lymph nodes with abnormal cortical thickening. The left axilla showed 3 lymph nodes with abnormal cortical thickening.  Accordingly on 04/26/2018 she proceeded to biopsy of the right breast area in question. The pathology from this procedure showed ([id number]): Invasive ductal carcinoma, grade II with calcifications. One right axillary lymph node and one left axillary lymph node were negative for carcinoma. Prognostic indicators significant for: estrogen receptor, 100% positive and progesterone receptor, 80% positive, both with strong staining intensity. Proliferation marker Ki67 at 70%. HER2 amplified with ratios HER2/CEP17 signals 4.52 and average HER2 copies per cell 10.85  The patient's subsequent history is as detailed below.   PAST MEDICAL HISTORY: Past Medical History:  Diagnosis Date   Allergy    Anemia    Normocytic   Anxiety    Asthma    Blood dyscrasia    Bronchitis 2005  CKD (chronic kidney disease)    CLASS 1-EXOPHTHALMOS-THYROTOXIC 02/08/2007   Diabetes mellitus without complication ([place])    Family history of breast cancer    Family history of lung cancer    Family history of prostate cancer     Gastroenteritis 07/10/07   GERD 07/24/2006   GRAVE'S DISEASE 01/01/2008   History of hidradenitis suppurativa    History of kidney stones    History of thrush    HIV DISEASE 07/24/2006   dx March 05   Hyperlipidemia    HYPERTENSION 07/24/2006   Hyperthyroidism 08/2006   Grave's Disease -diffuse radiotracer uptake 08/25/06 Thyroid scan-Cold nodule to R lower lobe of thyrorid   Menometrorrhagia    hx of   Nephrolithiasis    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 Completed 2 yrs Prednisone after lung bx confirmation. No symptoms since then.   Suppurative hidradenitis    Thyroid cancer ([place])    THYROID NODULE, RIGHT 02/08/2007  -2018 thyroid nodules were precancerous but pathology was negative for thyroid cancer.    PAST SURGICAL HISTORY: Past Surgical History:  Procedure Laterality Date   APPLICATION OF WOUND VAC N/A 01/20/2021   Procedure: APPLICATION OF WOUND VAC;  Surgeon: Wallace Going, DO;  Location: [place];  Service: Plastics;  Laterality: N/A;   BREAST EXCISIONAL BIOPSY Right 04/26/2018   right axilla negative   BREAST EXCISIONAL BIOPSY Left 04/26/2018   left axilla negative   BREAST LUMPECTOMY Right 10/03/2018   malignant   BREAST LUMPECTOMY WITH RADIOACTIVE SEED AND SENTINEL LYMPH NODE BIOPSY Right 10/03/2018   Procedure: RIGHT BREAST LUMPECTOMY WITH RADIOACTIVE SEED AND SENTINEL LYMPH NODE MAPPING;  Surgeon: Erroll Luna, MD;  Location: [place];  Service: General;  Laterality: Right;   BREAST SURGERY  1997   Breast Reduction    CYSTOSCOPY W/  URETERAL STENT REMOVAL  11/09/2012   Procedure: CYSTOSCOPY WITH STENT REMOVAL;  Surgeon: Alexis Frock, MD;  Location: [facility];  Service: Urology;  Laterality: Right;   CYSTOSCOPY WITH RETROGRADE PYELOGRAM, URETEROSCOPY AND STENT PLACEMENT  11/09/2012   Procedure: CYSTOSCOPY WITH RETROGRADE PYELOGRAM, URETEROSCOPY AND STENT PLACEMENT;  Surgeon: Alexis Frock, MD;  Location: [facility];  Service: Urology;  Laterality: Left;  LEFT URETEROSCOPY, STONE MANIPULATION, left STENT exchange    CYSTOSCOPY WITH STENT PLACEMENT  10/02/2012   Procedure: CYSTOSCOPY WITH STENT PLACEMENT;  Surgeon: Alexis Frock, MD;  Location: [facility];  Service: Urology;  Laterality: Left;   DEBRIDEMENT AND CLOSURE WOUND N/A 01/20/2021   Procedure: Excision of abdominal wound with closure;  Surgeon: Wallace Going, DO;  Location: [place];  Service: Plastics;  Laterality: N/A;   DILATION AND CURETTAGE OF UTERUS  Feb 2004   s/p for 1st trimester nonviable pregnancy   EYE SURGERY     sty under eyelid   INCISE AND DRAIN ABCESS  Nov 03   s/p I &D for righ inframmary fold hidradenitis   INCISION AND DRAINAGE PERITONSILLAR ABSCESS  Mar 03   IR CV LINE INJECTION  06/07/2018   IR IMAGING GUIDED PORT INSERTION  06/20/2018  IR REMOVAL TUN ACCESS W/ PORT W/O FL MOD SED  06/20/2018   IRRIGATION AND DEBRIDEMENT ABSCESS  01/31/2012   Procedure: IRRIGATION AND DEBRIDEMENT ABSCESS;  Surgeon: Shann Medal, MD;  Location: [facility];  Service: General;  Laterality: Right;  right breast and axilla    NEPHROLITHOTOMY  10/02/2012   Procedure: NEPHROLITHOTOMY PERCUTANEOUS;  Surgeon: Alexis Frock, MD;  Location: [facility];  Service: Urology;  Laterality: Right;  First Stage Percutaneous Nephrolithotomy with Surgeon Access, Left Ureteral Stent     NEPHROLITHOTOMY  10/04/2012   Procedure: NEPHROLITHOTOMY PERCUTANEOUS SECOND LOOK;  Surgeon: Alexis Frock, MD;  Location: [facility];  Service: Urology;  Laterality: Right;      NEPHROLITHOTOMY   10/08/2012   Procedure: NEPHROLITHOTOMY PERCUTANEOUS;  Surgeon: Alexis Frock, MD;  Location: [facility];  Service: Urology;  Laterality: Right;  THIRD STAGE, nephrostomy tube exchange x 2   NEPHROLITHOTOMY  10/11/2012   Procedure: NEPHROLITHOTOMY PERCUTANEOUS SECOND LOOK;  Surgeon: Alexis Frock, MD;  Location: [facility];  Service: Urology;  Laterality: Right;  RIGHT 4 STAGE PERCUTANOUS NEPHROLITHOTOMY, right URETEROSCOPY WITH HOLMIUM LASER    PANNICULECTOMY N/A 12/21/2020   Procedure: PANNICULECTOMY;  Surgeon: Wallace Going, DO;  Location: [place];  Service: Plastics;  Laterality: N/A;   PORT-A-CATH REMOVAL N/A 07/16/2020   Procedure: REMOVAL PORT-A-CATH;  Surgeon: Erroll Luna, MD;  Location: [place];  Service: General;  Laterality: N/A;   PORTACATH PLACEMENT Left 05/17/2018   Procedure: INSERTION PORT-A-CATH;  Surgeon: Coralie Keens, MD;  Location: [place];  Service: General;  Laterality: Left;   RADICAL NECK DISSECTION  04/12/2017   limited/notes 04/12/2017   RADICAL NECK DISSECTION N/A 04/12/2017   Procedure: RADICAL NECK DISSECTION;  Surgeon: Melida Quitter, MD;  Location: [place];  Service: ENT;  Laterality: N/A;  limited neck dissection 2 hours total   REDUCTION MAMMAPLASTY Bilateral 1998   RIGHT/LEFT HEART CATH AND CORONARY ANGIOGRAPHY N/A 03/12/2020   Procedure: RIGHT/LEFT HEART CATH AND CORONARY ANGIOGRAPHY;  Surgeon: Jolaine Artist, MD;  Location: [place] [facility];  Service: Cardiovascular;  Laterality: N/A;   [place]  1994   THYROIDECTOMY  04/12/2017   completion/notes 04/12/2017   THYROIDECTOMY N/A 04/12/2017   Procedure: THYROIDECTOMY;  Surgeon: Melida Quitter, MD;  Location: [place];  Service: ENT;  Laterality: N/A;  Completion Thyroidectomy   TOTAL THYROIDECTOMY  2010  Thyroid nodules removed- pathology at the ENT doctor 2018 were benign -Over active thyroid and thyroidectomy with benign pathology   FAMILY HISTORY Family History  Problem  Relation Age of Onset   Hypertension Mother    Cancer Mother        laryngeal   Heart disease Mother        stent   Hypertension Father    Lung cancer Father 13       hx smoking   Heart disease Other    Hypertension Other    Stroke Other        Grandparent   Kidney disease Other        Grandparent   Diabetes Other        FH of Diabetes   Hypertension Sister    Cancer Maternal Uncle        Lung CA   Hypertension Brother    Hypertension Sister    Breast cancer Maternal Aunt 65   Breast cancer Paternal Aunt 83   Prostate cancer Paternal Uncle    Breast cancer Maternal Aunt  dx 60+   Breast cancer Paternal Aunt        dx 29's   Breast cancer Paternal Aunt        dx 96's   Prostate cancer Paternal Uncle    Lung cancer Paternal Uncle    Breast cancer Cousin 42   Breast cancer Cousin        dx <50   Breast cancer Cousin        dx <50   Breast cancer Cousin        dx <50   Colon cancer Neg Hx    Esophageal cancer Neg Hx    Rectal cancer Neg Hx    Stomach cancer Neg Hx   The patient's mother is alive at age 20. The patient's father died at 32 from lung cancer (heavy smoker). She does not know much about her father. The patient has 1 brother and 2 sisters. The patient's mother had a history of laryngeal cancer. There was a maternal cousin with breast cancer diagnosed at age 10. There were 2 maternal aunts with breast cancer, the youngest being diagnosed in the 21's. There was a maternal great aunt with breast cancer. There were 2 paternal aunts with breast cancer.    GYNECOLOGIC HISTORY:  Patient's last menstrual period was 03/31/2014 (lmp unknown). Menarche: 55 years old Age at first live birth: 55 years old She is GX P1.  Her LMP was in 2017. She did not use HRT.    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 She is single. At home is the patient's daughter, [name], age 30, is a Pharmacist, hospital and also works in the  summers and Therapist, art.  The patient attends [employer].   ADVANCED DIRECTIVES: Not in place; at the 05/10/2018 visit the patient was given the appropriate documents to complete and notarized at her discretion   HEALTH MAINTENANCE: Social History   Tobacco Use   Smoking status: Former    Packs/day: 0.50    Years: 15.00    Pack years: 7.50    Types: Cigarettes    Start date: 04/12/2017    Quit date: 2019    Years since quitting: 3.7   Smokeless tobacco: Never  Vaping Use   Vaping Use: Never used  Substance Use Topics   Alcohol use: Yes    Alcohol/week: 0.0 standard drinks    Comment: social   Drug use: No     Colonoscopy: no  PAP: 09/2020, negative  Bone density: remote   Allergies  Allergen Reactions   Genvoya [Elviteg-Cobic-Emtricit-Tenofaf] Hives   Lisinopril Cough    Current Outpatient Medications  Medication Sig Dispense Refill   albuterol (VENTOLIN HFA) 108 (90 Base) MCG/ACT inhaler Inhale 2 puffs into the lungs every 6 (six) hours as needed for wheezing or shortness of breath. 8 g 2   allopurinol (ZYLOPRIM) 100 MG tablet TAKE 1 TABLET (100 MG TOTAL) BY MOUTH DAILY. FOR GOUT PREVENTION. 90 tablet 1   Blood Glucose Monitoring Suppl (FREESTYLE LITE) w/Device KIT Inject 1 each into the skin daily. 1 kit 0   candesartan (ATACAND) 32 MG tablet Take 1 tablet (32 mg total) by mouth daily. 30 tablet 11   carvedilol (COREG) 25 MG tablet Take 1 tablet (25 mg total) by mouth 2 (two) times daily. 180 tablet 3   cetirizine (ZYRTEC) 10 MG tablet Take 1 tablet by mouth at bedtime for allergies. 90 tablet 0   Cholecalciferol (  VITAMIN D) 50 MCG (2000 UT) tablet Take 4,000 Units by mouth daily.     dolutegravir ([name]) 50 MG tablet Take 1 tablet (50 mg total) by mouth daily. 30 tablet 11   doxycycline (VIBRA-TABS) 100 MG tablet Take 100 mg by mouth 2 (two) times daily.     empagliflozin (JARDIANCE) 10 MG TABS tablet Take 1 tablet (10 mg total) by mouth  daily before breakfast. 30 tablet 6   emtricitabine-tenofovir AF (DESCOVY) 200-25 MG tablet Take 1 tablet by mouth daily. 30 tablet 11   famotidine (PEPCID) 20 MG tablet TAKE 1 OR 2 TABLETS BY MOUTH AT BEDTIME FOR HOARSE VOICE/REFLUX 180 tablet 1   FLOVENT HFA 110 MCG/ACT inhaler TAKE 1 PUFF BY MOUTH TWICE A DAY 36 Inhaler 1   fluticasone (FLONASE) 50 MCG/ACT nasal spray Place 2 sprays into both nostrils daily. 16 g 6   gabapentin (NEURONTIN) 300 MG capsule Take 1 capsule (300 mg total) by mouth 3 (three) times daily. (Patient taking differently: Take 300 mg by mouth daily.) 90 capsule 3   glipiZIDE (GLUCOTROL) 5 MG tablet Take by mouth 2 (two) times daily. For diabetes.     glucose blood (ONETOUCH VERIO) test strip USE AS INSTRUCTED TO TEST BLOOD SUGAR DAILY 100 each 2   Lancets (ONETOUCH ULTRASOFT) lancets Use as instructed to test blood sugar daily 100 each 5   omeprazole (PRILOSEC) 40 MG capsule TAKE 1 CAPSULE BY MOUTH DAILY FOR REFLUX 90 capsule 0   potassium chloride SA (KLOR-CON) 20 MEQ tablet Take 1 tablet (20 mEq total) by mouth 2 (two) times daily. For low potassium. 10 tablet 0   rosuvastatin (CRESTOR) 10 MG tablet TAKE 1 TABLET BY MOUTH EVERY DAY FOR CHOLESTEROL (Patient taking differently: Take 10 mg by mouth daily.) 90 tablet 3   sertraline (ZOLOFT) 50 MG tablet TAKE 1 TABLET (50 MG TOTAL) BY MOUTH DAILY. FOR ANXIETY AND DEPRESSION. 90 tablet 1   sitaGLIPtin (JANUVIA) 50 MG tablet Take 1 tablet (50 mg total) by mouth daily. For diabetes. 90 tablet 1   spironolactone (ALDACTONE) 25 MG tablet Take 1 tablet (25 mg total) by mouth daily. 90 tablet 3   SYNTHROID 150 MCG tablet Take 1 tablet (150 mcg total) by mouth daily before breakfast. 90 tablet 1   tamoxifen (NOLVADEX) 20 MG tablet TAKE 1 TABLET BY MOUTH EVERY DAY (Patient taking differently: Take 20 mg by mouth daily.) 90 tablet 4   No current facility-administered medications for this visit.   Facility-Administered Medications  Ordered in Other Visits  Medication Dose Route Frequency Provider Last Rate Last Admin   sodium chloride flush (NS) 0.9 % injection 10 mL  10 mL Intracatheter PRN Wynne Jury, Virgie Dad, MD   10 mL at 09/19/18 1551    OBJECTIVE: African-American woman   Vitals:   07/27/21 1119  BP: (!) 161/93  Pulse: 88  Resp: 18  Temp: 97.9 F (36.6 C)  SpO2: 98%   Wt Readings from Last 3 Encounters:  07/27/21 231 lb 8 oz (105 kg)  07/01/21 229 lb 12.8 oz (104.2 kg)  06/30/21 227 lb (103 kg)   Body mass index is 38.52 kg/m.    ECOG FS:1 - Symptomatic but completely ambulatory  Sclerae unicteric, EOMs intact Wearing a mask No cervical or supraclavicular adenopathy Lungs no rales or rhonchi, no wheezes Heart regular rate and rhythm, no murmur appreciated Abd soft, obese nontender, positive bowel sounds MSK no focal spinal tenderness, no right upper extremity lymphedema Neuro: nonfocal, well  oriented, appropriate affect Breasts: The right breast is status postlumpectomy and radiation.  There remains hyperpigmentation and tenderness but there is no evidence of disease recurrence.  The left breast and both axillae are benign.   LAB RESULTS:  CMP     Component Value Date/Time   NA 138 03/17/2021 1050   NA 143 06/26/2020 0000   K 4.4 03/17/2021 1050   CL 109 03/17/2021 1050   CO2 24 03/17/2021 1050   GLUCOSE 147 (H) 03/17/2021 1050   BUN 19 03/17/2021 1050   BUN 25 (A) 06/26/2020 0000   CREATININE 2.74 (H) 03/17/2021 1050   CALCIUM 8.8 03/17/2021 1050   PROT 7.4 03/17/2021 1050   ALBUMIN 3.2 (L) 01/11/2021 0936   AST 28 03/17/2021 1050   AST 24 08/12/2019 1447   ALT 17 03/17/2021 1050   ALT 16 08/12/2019 1447   ALKPHOS 71 01/11/2021 0936   BILITOT 0.3 03/17/2021 1050   BILITOT 0.2 (L) 08/12/2019 1447   GFRNONAA 28 (L) 01/19/2021 1507   GFRNONAA 29 (L) 08/12/2019 1447   GFRAA 39 (L) 07/14/2020 1053   GFRAA 33 (L) 08/12/2019 1447    No results found for: TOTALPROTELP,  ALBUMINELP, A1GS, A2GS, BETS, BETA2SER, GAMS, MSPIKE, SPEI  No results found for: KPAFRELGTCHN, LAMBDASER, KAPLAMBRATIO  Lab Results  Component Value Date   WBC 7.7 07/27/2021   NEUTROABS 4.9 07/27/2021   HGB 11.7 (L) 07/27/2021   HCT 34.9 (L) 07/27/2021   MCV 93.1 07/27/2021   PLT 228 07/27/2021   No results found for: LABCA2  No components found for: PYPPJK932  No results for input(s): INR in the last 168 hours.  No results found for: LABCA2  No results found for: IZT245  No results found for: YKD983  No results found for: JAS505  No results found for: CA2729  No components found for: HGQUANT  No results found for: CEA1 / No results found for: CEA1   No results found for: AFPTUMOR  No results found for: CHROMOGRNA  No results found for: HGBA, HGBA2QUANT, HGBFQUANT, HGBSQUAN (Hemoglobinopathy evaluation)   No results found for: LDH  Lab Results  Component Value Date   IRON 26 (L) 02/08/2007   TIBC 352 06/26/2020   IRONPCTSAT 15 06/26/2020   (Iron and TIBC)  Lab Results  Component Value Date   FERRITIN 141 06/26/2020    Urinalysis    Component Value Date/Time   COLORURINE RED (A) 12/10/2019 1145   APPEARANCEUR TURBID (A) 12/10/2019 1145   LABSPEC 1.017 12/10/2019 1145   PHURINE < OR = 5.0 12/10/2019 1145   GLUCOSEU NEGATIVE 12/10/2019 1145   GLUCOSEU NEG mg/dL 01/23/2008 1418   HGBUR 2+ (A) 12/10/2019 1145   BILIRUBINUR negative 04/30/2021 1119   KETONESUR negative 03/09/2021 0909   KETONESUR NEGATIVE 12/10/2019 1145   PROTEINUR Positive (A) 04/30/2021 1119   PROTEINUR 3+ (A) 12/10/2019 1145   UROBILINOGEN 0.2 04/30/2021 1119   UROBILINOGEN 0.2 10/21/2014 2347   NITRITE negative 04/30/2021 1119   NITRITE POSITIVE (A) 12/10/2019 1145   LEUKOCYTESUR Large (3+) (A) 04/30/2021 1119   LEUKOCYTESUR 3+ (A) 12/10/2019 1145    STUDIES: DG Lumbar Spine Complete  Result Date: 07/01/2021 CLINICAL DATA:  Chronic low back pain in the mid to lower  back with radiculopathy to RIGHT lower extremity. Increasing symptoms. EXAM: LUMBAR SPINE - COMPLETE 4+ VIEW COMPARISON:  May 24, 2019. FINDINGS: Staghorn type calculus/branching calculus of the RIGHT kidney, in the upper pole measuring 3.8 cm, in the interpolar kidney w  measuring approximately 2.7 cm in the lower pole measuring approximately 2.6 cm. Spinal degenerative changes. Transitional vertebral anatomy with 4 lumbar type vertebral bodies. Vertebral body heights and disc spaces are maintained without acute findings relative to the lumbar spine without osteophyte formation. There is some mild-to-moderate facet arthropathy in the lower lumbar spine. IMPRESSION: Staghorn type calculus/branching calculus of the RIGHT kidney as described above. This is grossly unchanged compared to prior imaging. Spinal degenerative changes involving facet arthropathy, no substantial disc space narrowing or osteophyte formation of the lumbar spine. No acute findings. Electronically Signed   By: Zetta Bills M.D.   On: 07/01/2021 11:12   DG Hand Complete Right  Result Date: 07/01/2021 CLINICAL DATA:  Chronic hand pain with paresthesias in a 55 year old female. No history of trauma, reported history of gout. EXAM: RIGHT HAND - COMPLETE 3+ VIEW COMPARISON:  None FINDINGS: There is no evidence of fracture or dislocation. There is no evidence of arthropathy or other focal bone abnormality. Soft tissues are unremarkable. IMPRESSION: Negative. Electronically Signed   By: Zetta Bills M.D.   On: 07/01/2021 11:14   US THYROID  Result Date: 07/15/2021 CLINICAL DATA:  Other. History of thyroid cancer status post total thyroidectomy with residual lymphadenopathy EXAM: THYROID ULTRASOUND TECHNIQUE: Ultrasound examination of the thyroid gland and adjacent soft tissues was performed. COMPARISON:  Prior thyroid ultrasound 08/03/2020 FINDINGS: The thyroid gland is surgically absent. Evaluation of the thyroid resection bed on both the  right and the left demonstrates no evidence of residual or recurrent thyroid tissue. On the left, stable elongated lymph node measuring no more than 0.4 cm in short axis. This is unchanged compared to the prior imaging. However, there is an additional lymph node slightly more laterally which was not included in the field of view on the prior study and measures up to 0.6 cm in short axis. Additionally, there is an elongated and slightly enlarged lymph node in the right neck measuring up to 0.8 cm in short axis. IMPRESSION: 1. Surgical changes of total thyroidectomy without evidence of residual or recurrent thyroid tissue. 2. Bilateral prominent and borderline enlarged cervical chain lymph nodes. The node previously identified on the left remains unchanged at 0.4 cm in short axis. However, at least 1 additional new and slightly enlarged lymph node is now identified on both the left and the right. Differential considerations include reactive adenopathy, or less likely an early lymphoproliferative process, or metastatic disease. Reactive adenopathy is favored. However, continued clinical surveillance is recommended. If there is biochemical evidence of recurrent thyroid cancer, or if the lymph nodes demonstrate enlargement over time, repeat imaging and possibly tissue sampling may become warranted. Electronically Signed   By: Jacqulynn Cadet M.D.   On: 07/15/2021 13:00     ELIGIBLE FOR AVAILABLE RESEARCH PROTOCOL: declined DCP study  ASSESSMENT: 54 y.o. [name], [place] woman status post right breast upper inner quadrant biopsy 04/26/2018 for a clinical T2 N0, stage IB invasive ductal carcinoma, grade 2, ER/PR positive, HER-2 positive with an MIB-1 of 70%.  (1) genetics testing 07/03/2018: no pathogenic mutations. Genes Analyzed (43 total): APC*, ATM*, AXIN2, BARD1, BMPR1A, BRCA1*, BRCA2*, BRIP1*, CDH1*, CDK4, CDKN2A, CHEK2*, DICER1,GALNT12, HOXB13, MEN1, MLH1*, MRE11A, MSH2*, MSH3, MSH6*, MUTYH*, NBN, NF1*,  NTHL1, PALB2*, PMS2*, POLD1, POLE,PTEN*, RAD50, RAD51C*, RAD51D*, RET, SDHB, SDHD, SMAD4, SMARCA4, STK11 and TP53* (sequencing and deletion/duplication); EGFR (sequencing only); EPCAM and GREM1 (deletion/duplication only). DNA and RNA analyses performed for * genes.  (2) neoadjuvant chemotherapy consisting of carboplatin, docetaxel, trastuzumab and pertuzumab  given every 21 days x 6, starting 05/31/2018, completed 09/14/2018  (a) Pertuzumab discontinued starting with cycle 2 because of diarrhea problems  (b) Taxotere stopped after 4 cycles due peripheral neuropathy and substituted for last two cycles with gemcitabine  (3)  definitive surgery on 10/03/2018 showed residual ypT1c ypN0 invasive ductal carcinoma, grade 2, with negative margins; a total of 5 sentinel lymph nodes removed; repeat prognostic panel again triple positive  (4) T-DM1 given adjuvantly due to residual disease: started 11/13/2018, completed 11/04/2019.  (a) echo on 05/16/2018 shows an EF of 60-65%.  (b) echo on 08/27/18 shows an EF 55-60%  (c) echo on 12/03/2018 shows an EF of 60-65%  (d) echocardiogram on 03/06/2019 showed an ejection fraction of 55% as read by Dr. Haroldine Laws.  (e) echocardiogram 06/20/2019 shows an ejection fraction in the 55-60% range  (f) echo 02/20/2020 shows an ejection fraction in the 60-65% range.  (5) adjuvant radiation completed 01/07/2019 - 02/21/2019 (delayed due to wound healing issues)   Site/dose:   The patient initially received a dose of 50.4 Gy in 28 fractions to the right breast using whole-breast tangent fields. This was delivered using a 3-D conformal technique. The patient then received a boost to the seroma. This delivered an additional 10 Gy in 5 fractions using 12e electrons with a special teletherapy technique. The total dose was 60.4 Gy.  (6) tamoxifen started 02/28/2019  (7) hidradenitis/boils: on doxycycline as needed   PLAN: [name] is now just about 3 years out from definitive  surgery for her breast cancer with no evidence of disease recurrence.  This is favorable.  She is tolerating tamoxifen generally well and the plan is to continue that a minimum of 5 years.  She is a bit behind on mammography I have gone ahead and placed the order and I expect that she will have that done later this month.  [name] does have a variety of comorbidities which can make evaluation difficult but I certainly do not find any symptoms or signs suggestive of disease recurrence.  She will return to see [place] in 1year.  She knows to call for any other issue that may develop before that  Total encounter time 25 minutes.Sarajane Jews C. Braylan Faul, MD  07/27/21 11:35 AM Medical Oncology and Hematology [facility] [place], [place] [address] Tel. [phone]    Fax. [phone]   I, Wilburn Mylar, am acting as scribe for Dr. Virgie Dad. [name].  I, Lurline Del MD, have reviewed the above documentation for accuracy and completeness, and I agree with the above.   *Total Encounter Time as defined by the Centers for Medicare and Medicaid Services includes, in addition to the face-to-face time of a patient visit (documented in the note above) non-face-to-face time: obtaining and reviewing outside history, ordering and reviewing medications, tests or procedures, care coordination (communications with other health care professionals or caregivers) and documentation in the medical record.

## 2021-07-27 ENCOUNTER — Inpatient Hospital Stay (HOSPITAL_BASED_OUTPATIENT_CLINIC_OR_DEPARTMENT_OTHER): Payer: 59 | Admitting: Oncology

## 2021-07-27 ENCOUNTER — Telehealth: Payer: Self-pay | Admitting: Primary Care

## 2021-07-27 ENCOUNTER — Inpatient Hospital Stay: Payer: 59 | Attending: Oncology

## 2021-07-27 ENCOUNTER — Other Ambulatory Visit: Payer: Self-pay

## 2021-07-27 ENCOUNTER — Other Ambulatory Visit: Payer: Self-pay | Admitting: Endocrinology

## 2021-07-27 VITALS — BP 161/93 | HR 88 | Temp 97.9°F | Resp 18 | Ht 65.0 in | Wt 231.5 lb

## 2021-07-27 DIAGNOSIS — L732 Hidradenitis suppurativa: Secondary | ICD-10-CM | POA: Insufficient documentation

## 2021-07-27 DIAGNOSIS — D869 Sarcoidosis, unspecified: Secondary | ICD-10-CM

## 2021-07-27 DIAGNOSIS — Z79810 Long term (current) use of selective estrogen receptor modulators (SERMs): Secondary | ICD-10-CM | POA: Diagnosis not present

## 2021-07-27 DIAGNOSIS — C50311 Malignant neoplasm of lower-inner quadrant of right female breast: Secondary | ICD-10-CM

## 2021-07-27 DIAGNOSIS — Z9221 Personal history of antineoplastic chemotherapy: Secondary | ICD-10-CM | POA: Diagnosis not present

## 2021-07-27 DIAGNOSIS — N183 Chronic kidney disease, stage 3 unspecified: Secondary | ICD-10-CM

## 2021-07-27 DIAGNOSIS — Z923 Personal history of irradiation: Secondary | ICD-10-CM | POA: Diagnosis not present

## 2021-07-27 DIAGNOSIS — Z170 Estrogen receptor positive status [ER+]: Secondary | ICD-10-CM | POA: Diagnosis not present

## 2021-07-27 DIAGNOSIS — C50211 Malignant neoplasm of upper-inner quadrant of right female breast: Secondary | ICD-10-CM | POA: Insufficient documentation

## 2021-07-27 DIAGNOSIS — E1122 Type 2 diabetes mellitus with diabetic chronic kidney disease: Secondary | ICD-10-CM

## 2021-07-27 DIAGNOSIS — C73 Malignant neoplasm of thyroid gland: Secondary | ICD-10-CM

## 2021-07-27 LAB — CBC WITH DIFFERENTIAL (CANCER CENTER ONLY)
Abs Immature Granulocytes: 0.11 10*3/uL — ABNORMAL HIGH (ref 0.00–0.07)
Basophils Absolute: 0.1 10*3/uL (ref 0.0–0.1)
Basophils Relative: 1 %
Eosinophils Absolute: 0.2 10*3/uL (ref 0.0–0.5)
Eosinophils Relative: 3 %
HCT: 34.9 % — ABNORMAL LOW (ref 36.0–46.0)
Hemoglobin: 11.7 g/dL — ABNORMAL LOW (ref 12.0–15.0)
Immature Granulocytes: 1 %
Lymphocytes Relative: 24 %
Lymphs Abs: 1.8 10*3/uL (ref 0.7–4.0)
MCH: 31.2 pg (ref 26.0–34.0)
MCHC: 33.5 g/dL (ref 30.0–36.0)
MCV: 93.1 fL (ref 80.0–100.0)
Monocytes Absolute: 0.7 10*3/uL (ref 0.1–1.0)
Monocytes Relative: 8 %
Neutro Abs: 4.9 10*3/uL (ref 1.7–7.7)
Neutrophils Relative %: 63 %
Platelet Count: 228 10*3/uL (ref 150–400)
RBC: 3.75 MIL/uL — ABNORMAL LOW (ref 3.87–5.11)
RDW: 17.4 % — ABNORMAL HIGH (ref 11.5–15.5)
WBC Count: 7.7 10*3/uL (ref 4.0–10.5)
nRBC: 0 % (ref 0.0–0.2)

## 2021-07-27 LAB — CMP (CANCER CENTER ONLY)
ALT: 19 U/L (ref 0–44)
AST: 28 U/L (ref 15–41)
Albumin: 3.3 g/dL — ABNORMAL LOW (ref 3.5–5.0)
Alkaline Phosphatase: 87 U/L (ref 38–126)
Anion gap: 10 (ref 5–15)
BUN: 27 mg/dL — ABNORMAL HIGH (ref 6–20)
CO2: 25 mmol/L (ref 22–32)
Calcium: 9.1 mg/dL (ref 8.9–10.3)
Chloride: 105 mmol/L (ref 98–111)
Creatinine: 2.72 mg/dL — ABNORMAL HIGH (ref 0.44–1.00)
GFR, Estimated: 20 mL/min — ABNORMAL LOW (ref >60)
Glucose, Bld: 184 mg/dL — ABNORMAL HIGH (ref 70–99)
Potassium: 4 mmol/L (ref 3.5–5.1)
Sodium: 140 mmol/L (ref 135–145)
Total Bilirubin: 0.3 mg/dL (ref 0.3–1.2)
Total Protein: 7.6 g/dL (ref 6.5–8.1)

## 2021-07-27 MED ORDER — TAMOXIFEN CITRATE 20 MG PO TABS: 20.0000 mg | ORAL_TABLET | Freq: Every day | ORAL | 4 refills | Status: DC

## 2021-07-27 NOTE — Telephone Encounter (Signed)
Pt called stating that she needs a order for a BMI Nephrology at [facility] with Dr Dimas Aguas. [phone]

## 2021-07-27 NOTE — Telephone Encounter (Signed)
Left message to return call to our office.  I have called patient to get more information but she did not answer. Are you aware of what she needs ordered?

## 2021-07-27 NOTE — Telephone Encounter (Signed)
Looks like she may have been requesting a BMP?  She did have a CMP and CBC drawn today at the cancer center.  We will await her phone call back as I am not sure as to what she is asking.

## 2021-07-29 ENCOUNTER — Other Ambulatory Visit: Payer: Self-pay

## 2021-07-29 ENCOUNTER — Ambulatory Visit: Payer: 59

## 2021-07-30 NOTE — Telephone Encounter (Signed)
Left message to return call to our office.  

## 2021-08-02 ENCOUNTER — Other Ambulatory Visit: Payer: Self-pay | Admitting: Primary Care

## 2021-08-02 DIAGNOSIS — E119 Type 2 diabetes mellitus without complications: Secondary | ICD-10-CM

## 2021-08-02 NOTE — Telephone Encounter (Signed)
Left message to return call to our office.  

## 2021-08-03 ENCOUNTER — Ambulatory Visit: Payer: 59

## 2021-08-06 NOTE — Telephone Encounter (Signed)
Have called patient x 3 no call back

## 2021-08-08 NOTE — Telephone Encounter (Signed)
Noted, will await for patient to call us back when she's available.

## 2021-08-10 ENCOUNTER — Ambulatory Visit: Payer: 59

## 2021-08-13 ENCOUNTER — Telehealth: Payer: Self-pay | Admitting: *Deleted

## 2021-08-13 NOTE — Telephone Encounter (Signed)
"  [employer] [phone]) requesting status update of record request of Dr. [name] from 01/15/2021 through 07/19/2021 faxed to [phone] on 08/04/2021 for claim number [id number].    "Records only" request documented to forms tracker by this nurse.   Date document received is 10/19/222.  [facility] standard work process immediately followed placing request to bin intended for forwarding to [facility] staff who process record request.        Above to [employer] identified as Financial controller.  Request currently noted in patient files.   Provided [facility] phone number [phone] opt 1) for status check as [facility] does not manage record request and no form included with request to process.  Second voicemail for clarification.   Provided [facility] Fax [phone]) to request records as Oncologist request not noted as received.   Files request was sent to cardiologist and gastroenterologist has grayed R.O.I. identical to [facility] request.   H.I.P.A.A.approved request required by [facility] Staff to process request.

## 2021-08-20 ENCOUNTER — Other Ambulatory Visit: Payer: Self-pay

## 2021-08-20 ENCOUNTER — Encounter: Payer: Self-pay | Admitting: Internal Medicine

## 2021-08-20 ENCOUNTER — Ambulatory Visit
Admission: RE | Admit: 2021-08-20 | Discharge: 2021-08-20 | Disposition: A | Discharge status: Home / Self Care | Payer: 59 | Source: Ambulatory Visit | Attending: Oncology | Admitting: Oncology

## 2021-08-20 DIAGNOSIS — D869 Sarcoidosis, unspecified: Secondary | ICD-10-CM

## 2021-08-20 DIAGNOSIS — C50311 Malignant neoplasm of lower-inner quadrant of right female breast: Secondary | ICD-10-CM

## 2021-08-21 ENCOUNTER — Other Ambulatory Visit: Payer: Self-pay | Admitting: Cardiovascular Disease

## 2021-08-24 ENCOUNTER — Ambulatory Visit: Payer: 59

## 2021-08-24 NOTE — Progress Notes (Incomplete)
08/24/2021 [name] [birth year] [id number]   HPI:  [name] is a 55 y.o. female patient of Dr Oval Linsey, with a PMH below who presents today for advanced hypertension follow up.  She was referred to our clinic by Dr. Haroldine Laws, who sees her for cardio-oncology.  originally seen by Dr. Oval Linsey last fall, with a pressure of 162/102. At that visit she noted that home readings were mostly 370-488'Q systolic, however the diastolic was 91-694'H.  She had also just started prednisone for a gout flare at that time.  At follow up appointments her systolic improved, however diastolic remained stubbornly elevated.  Unfortunately the gout flares prevent [place] from using chlorthalidone, which might be more helpful at diastolic reduction.    Past Medical History: hyperlipidemia 6/22 - LDL 71 on rosuvastatin 10 mg daily  Breast cancer Now on tamoxifen  DM2 8/22 A1c 6.1 on Jardiance 10 mg  CKD3 6/22 SCr 2.74 CrCl with adjusted body weight - 28  hypothroidism 7/22 TSH 20.04 - on levothyroxine 112 mcg (needs f/u with Endo)     Blood Pressure Goal:  130/80  Current Medications: candesartan 32 mg qd, carvedilol 25 mg QD!!, spironolactone 25 mg qd  Family Hx: mother with heart disease; both parents and multiple siblings with hypertension, breast cancer in 3 relatives, (both paternal and maternal)  Social Hx: no tobacco, only occasional alcohol; no regular caffeine intake  Diet: usually eats 2 meals per day, has some intolerances to smells, so avoids restaurants, does not add salt to her foods. re-call pam about PREP  Exercise: none, willing to re-consider PREP  Home BP readings:   Intolerances: lisinopril - cough; hctz -gout flares  Labs: 10/22:  Na 140, K 4.0, Glu 184, BUN 27, SCr 2.72, GFR <20   Wt Readings from Last 3 Encounters:  07/27/21 231 lb 8 oz (105 kg)  07/01/21 229 lb 12.8 oz (104.2 kg)  06/30/21 227 lb (103 kg)   BP Readings from Last 3 Encounters:   07/27/21 (!) 161/93  07/01/21 140/78  06/30/21 124/82   Pulse Readings from Last 3 Encounters:  07/27/21 88  07/01/21 76  06/30/21 72    Current Outpatient Medications  Medication Sig Dispense Refill   albuterol (VENTOLIN HFA) 108 (90 Base) MCG/ACT inhaler Inhale 2 puffs into the lungs every 6 (six) hours as needed for wheezing or shortness of breath. 8 g 2   allopurinol (ZYLOPRIM) 100 MG tablet TAKE 1 TABLET (100 MG TOTAL) BY MOUTH DAILY. FOR GOUT PREVENTION. 90 tablet 1   Blood Glucose Monitoring Suppl (FREESTYLE LITE) w/Device KIT Inject 1 each into the skin daily. 1 kit 0   candesartan (ATACAND) 32 MG tablet Take 1 tablet (32 mg total) by mouth daily. 30 tablet 11   carvedilol (COREG) 25 MG tablet TAKE 1 TABLET BY MOUTH TWICE A DAY 180 tablet 0   cetirizine (ZYRTEC) 10 MG tablet Take 1 tablet by mouth at bedtime for allergies. 90 tablet 0   Cholecalciferol (VITAMIN D) 50 MCG (2000 UT) tablet Take 4,000 Units by mouth daily.     dolutegravir ([name]) 50 MG tablet Take 1 tablet (50 mg total) by mouth daily. 30 tablet 11   doxycycline (VIBRA-TABS) 100 MG tablet Take 100 mg by mouth 2 (two) times daily.     empagliflozin (JARDIANCE) 10 MG TABS tablet Take 1 tablet (10 mg total) by mouth daily before breakfast. 30 tablet 6   emtricitabine-tenofovir AF (DESCOVY) 200-25 MG tablet Take 1 tablet  by mouth daily. 30 tablet 11   famotidine (PEPCID) 20 MG tablet TAKE 1 OR 2 TABLETS BY MOUTH AT BEDTIME FOR HOARSE VOICE/REFLUX 180 tablet 1   FLOVENT HFA 110 MCG/ACT inhaler TAKE 1 PUFF BY MOUTH TWICE A DAY 36 Inhaler 1   fluticasone (FLONASE) 50 MCG/ACT nasal spray Place 2 sprays into both nostrils daily. 16 g 6   gabapentin (NEURONTIN) 300 MG capsule Take 1 capsule (300 mg total) by mouth 3 (three) times daily. (Patient taking differently: Take 300 mg by mouth daily.) 90 capsule 3   glipiZIDE (GLUCOTROL) 5 MG tablet Take by mouth 2 (two) times daily. For diabetes.     glucose blood (ONETOUCH  VERIO) test strip USE AS INSTRUCTED TO TEST BLOOD SUGAR DAILY 100 each 2   Lancets (ONETOUCH ULTRASOFT) lancets Use as instructed to test blood sugar daily 100 each 5   omeprazole (PRILOSEC) 40 MG capsule TAKE 1 CAPSULE BY MOUTH DAILY FOR REFLUX 90 capsule 0   potassium chloride SA (KLOR-CON) 20 MEQ tablet Take 1 tablet (20 mEq total) by mouth 2 (two) times daily. For low potassium. 10 tablet 0   rosuvastatin (CRESTOR) 10 MG tablet TAKE 1 TABLET BY MOUTH EVERY DAY FOR CHOLESTEROL (Patient taking differently: Take 10 mg by mouth daily.) 90 tablet 3   sertraline (ZOLOFT) 50 MG tablet TAKE 1 TABLET (50 MG TOTAL) BY MOUTH DAILY. FOR ANXIETY AND DEPRESSION. 90 tablet 1   sitaGLIPtin (JANUVIA) 50 MG tablet Take 1 tablet (50 mg total) by mouth daily. For diabetes. 90 tablet 1   spironolactone (ALDACTONE) 25 MG tablet Take 1 tablet (25 mg total) by mouth daily. 90 tablet 3   SYNTHROID 150 MCG tablet Take 1 tablet (150 mcg total) by mouth daily before breakfast. 90 tablet 1   tamoxifen (NOLVADEX) 20 MG tablet Take 1 tablet (20 mg total) by mouth daily. 90 tablet 4   No current facility-administered medications for this visit.   Facility-Administered Medications Ordered in Other Visits  Medication Dose Route Frequency Provider Last Rate Last Admin   sodium chloride flush (NS) 0.9 % injection 10 mL  10 mL Intracatheter PRN Magrinat, Virgie Dad, MD   10 mL at 09/19/18 1551    Allergies  Allergen Reactions   Genvoya [Elviteg-Cobic-Emtricit-Tenofaf] Hives   Lisinopril Cough    Past Medical History:  Diagnosis Date   Allergy    Anemia    Normocytic   Anxiety    Asthma    Blood dyscrasia    Bronchitis 2005   CKD (chronic kidney disease)    CLASS 1-EXOPHTHALMOS-THYROTOXIC 02/08/2007   Diabetes mellitus without complication ([place])    Family history of breast cancer    Family history of lung cancer    Family history of prostate cancer    Gastroenteritis 07/10/07   GERD 07/24/2006   GRAVE'S DISEASE  01/01/2008   History of hidradenitis suppurativa    History of kidney stones    History of thrush    HIV DISEASE 07/24/2006   dx March 05   Hyperlipidemia    HYPERTENSION 07/24/2006   Hyperthyroidism 08/2006   Grave's Disease -diffuse radiotracer uptake 08/25/06 Thyroid scan-Cold nodule to R lower lobe of thyrorid   Menometrorrhagia    hx of   Nephrolithiasis    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 Completed 2 yrs Prednisone after lung bx confirmation. No symptoms since then.   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  No problem-specific Assessment & Plan notes found for this encounter.   Tommy Medal PharmD CPP [facility] [address] [place],  [address] [phone]

## 2021-08-31 ENCOUNTER — Encounter (HOSPITAL_COMMUNITY): Payer: Self-pay | Admitting: Internal Medicine

## 2021-08-31 ENCOUNTER — Other Ambulatory Visit: Payer: Self-pay

## 2021-08-31 ENCOUNTER — Ambulatory Visit (HOSPITAL_COMMUNITY)
Admission: RE | Admit: 2021-08-31 | Discharge: 2021-08-31 | Disposition: A | Discharge status: Home / Self Care | Payer: 59 | Source: Ambulatory Visit | Attending: Internal Medicine | Admitting: Internal Medicine

## 2021-08-31 VITALS — BP 148/90 | HR 66 | Wt 237.2 lb

## 2021-08-31 DIAGNOSIS — I130 Hypertensive heart and chronic kidney disease with heart failure and stage 1 through stage 4 chronic kidney disease, or unspecified chronic kidney disease: Secondary | ICD-10-CM | POA: Diagnosis not present

## 2021-08-31 DIAGNOSIS — E669 Obesity, unspecified: Secondary | ICD-10-CM | POA: Diagnosis not present

## 2021-08-31 DIAGNOSIS — Z21 Asymptomatic human immunodeficiency virus [HIV] infection status: Secondary | ICD-10-CM | POA: Diagnosis not present

## 2021-08-31 DIAGNOSIS — R0609 Other forms of dyspnea: Secondary | ICD-10-CM | POA: Diagnosis not present

## 2021-08-31 DIAGNOSIS — I10 Essential (primary) hypertension: Secondary | ICD-10-CM | POA: Diagnosis not present

## 2021-08-31 DIAGNOSIS — Z87891 Personal history of nicotine dependence: Secondary | ICD-10-CM | POA: Diagnosis not present

## 2021-08-31 DIAGNOSIS — Z923 Personal history of irradiation: Secondary | ICD-10-CM | POA: Diagnosis not present

## 2021-08-31 DIAGNOSIS — I2510 Atherosclerotic heart disease of native coronary artery without angina pectoris: Secondary | ICD-10-CM | POA: Insufficient documentation

## 2021-08-31 DIAGNOSIS — Z09 Encounter for follow-up examination after completed treatment for conditions other than malignant neoplasm: Secondary | ICD-10-CM | POA: Insufficient documentation

## 2021-08-31 DIAGNOSIS — N184 Chronic kidney disease, stage 4 (severe): Secondary | ICD-10-CM

## 2021-08-31 DIAGNOSIS — Z853 Personal history of malignant neoplasm of breast: Secondary | ICD-10-CM | POA: Insufficient documentation

## 2021-08-31 DIAGNOSIS — G4733 Obstructive sleep apnea (adult) (pediatric): Secondary | ICD-10-CM | POA: Diagnosis not present

## 2021-08-31 DIAGNOSIS — E1122 Type 2 diabetes mellitus with diabetic chronic kidney disease: Secondary | ICD-10-CM | POA: Diagnosis not present

## 2021-08-31 DIAGNOSIS — R0600 Dyspnea, unspecified: Secondary | ICD-10-CM | POA: Diagnosis present

## 2021-08-31 NOTE — Progress Notes (Signed)
Cardio-oncology Clinic Note    Date:  08/31/2021   ID:  [name], [name] 10/22/1965, MRN [id number]  Location: Home  Provider location: [facility] Type of Visit: Established patient  PCP:  [name], NP  Cardiologist:  None Primary HF: [name]  Chief Complaint: Heart Failure follow-up   History of Present Illness:  [name] is a  55 y.o. female with h/o DM, Graves disease, thyroid CA, HIV, HTN and right breast cancer referred by Dr. Doris Cheadle for enrollment into the Cardio-Oncology program for EF monitoring during chemotherapy   Right breast CA -  upper inner quadrant biopsy 04/26/2018 for a clinical T2 N0, stage Ib invasive ductal carcinoma, grade 2, with an MIB-1 of 70%.    (1) neoadjuvant chemotherapy with carboplatin, docetaxel, trastuzumab and Pertuzumab given every 21 days x 6, starting 05/31/2018             (a) Pertuzumab discontinued starting with cycle 2 because of diarrhea problems   (2) trastuzumab to continue to total 6 months             (a) echo on 05/16/2018 shows an EF of 60-65%.             (b) echo on 08/27/18 shows an EF 55-60%             (c) echo on 12/03/18 EF 60-65% GLS -18.8  (d) echo on 03/06/19 EF 55% (read as 40-45%) GLS 15.7% - underestimated   (3) s/p lumpectomy 12/19   (4) adjuvant radiation completed 5/20   (5) Herceptin completed 1/21   We saw her in September 2020 for increased SOB. Felt it was volume overload. ReDs was 39%. BP up. Started on spiro and Farxiga but creatinine bumped. Arlyce Harman stopped and then [place] stopped. Wilder Glade supposed to be restarted but insurance would pay for it.  She finished Herceptin on 11/04/19. Underwent R/L cath in 5/21 with normal filling pressures and CO. Mild PH. CPX test ordered but never completed.   PSG: 10/21  AHI 10.4 Has not had f/u   Here for f/u. Feels pretty good. Can do ADLs without too much problem. Takes breaks as needed. Occasional edema. No  orthopnea or PND. No CP.    Studies:   Echo 02/20/20: EF 55-60% Grade I DD. RV ok Personally reviewed  Underwent R/L cath in 5/21 for severe dyspnea  Ao = 175/101 (131) LV = 175/11 RA = 6 RV = 40/11 PA = 41/12 (26) PCW = 10 Fick cardiac output/index = 5.1/2.4 PVR = 3.2 WU Ao sat = 96% PA sat = 64%, 65%   Assessment: 1. Normal coronary arteries 2. LVEF 60-65% 3. Mild PAH likely due to OSA/OHS 4. Otherwise normal hemodynamics 4. Severe systemic HTN  CXR 05/24/19: No acute disease    Past Medical History:  Diagnosis Date   Allergy    Anemia    Normocytic   Anxiety    Asthma    Blood dyscrasia    Bronchitis 2005   CKD (chronic kidney disease)    CLASS 1-EXOPHTHALMOS-THYROTOXIC 02/08/2007   Diabetes mellitus without complication ([place])    Family history of breast cancer    Family history of lung cancer    Family history of prostate cancer    Gastroenteritis 07/10/07   GERD 07/24/2006   GRAVE'S DISEASE 01/01/2008   History of hidradenitis suppurativa    History of kidney stones    History of thrush  HIV DISEASE 07/24/2006   dx March 05   Hyperlipidemia    HYPERTENSION 07/24/2006   Hyperthyroidism 08/2006   Grave's Disease -diffuse radiotracer uptake 08/25/06 Thyroid scan-Cold nodule to R lower lobe of thyrorid   Menometrorrhagia    hx of   Nephrolithiasis    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 Completed 2 yrs Prednisone after lung bx confirmation. No symptoms since then.   Suppurative hidradenitis    Thyroid cancer ([place])    THYROID NODULE, RIGHT 02/08/2007    Past Surgical History:  Procedure Laterality Date   APPLICATION OF WOUND VAC N/A 01/20/2021   Procedure: APPLICATION OF WOUND VAC;  Surgeon: Wallace Going, DO;  Location: [facility];  Service: Plastics;  Laterality: N/A;   BREAST EXCISIONAL BIOPSY Right 04/26/2018   right axilla negative   BREAST EXCISIONAL BIOPSY Left 04/26/2018   left axilla negative   BREAST LUMPECTOMY Right 10/03/2018   malignant   BREAST LUMPECTOMY WITH RADIOACTIVE SEED AND SENTINEL LYMPH NODE BIOPSY Right 10/03/2018   Procedure: RIGHT BREAST LUMPECTOMY WITH RADIOACTIVE SEED AND SENTINEL LYMPH NODE MAPPING;  Surgeon: Erroll Luna, MD;  Location: [facility];  Service: General;  Laterality: Right;   BREAST SURGERY  1997   Breast Reduction    CYSTOSCOPY W/ URETERAL STENT REMOVAL  11/09/2012   Procedure: CYSTOSCOPY WITH STENT REMOVAL;  Surgeon: Alexis Frock, MD;  Location: [facility];  Service: Urology;  Laterality: Right;   CYSTOSCOPY WITH RETROGRADE PYELOGRAM, URETEROSCOPY AND STENT PLACEMENT  11/09/2012   Procedure: CYSTOSCOPY WITH RETROGRADE PYELOGRAM, URETEROSCOPY AND STENT PLACEMENT;  Surgeon: Alexis Frock, MD;  Location: [facility];  Service: Urology;  Laterality: Left;  LEFT URETEROSCOPY, STONE MANIPULATION, left STENT exchange    CYSTOSCOPY WITH STENT PLACEMENT  10/02/2012   Procedure: CYSTOSCOPY WITH STENT PLACEMENT;  Surgeon: Alexis Frock, MD;  Location: [facility];  Service: Urology;  Laterality: Left;   DEBRIDEMENT AND CLOSURE WOUND N/A 01/20/2021   Procedure: Excision of abdominal wound with closure;  Surgeon: Wallace Going, DO;  Location: [facility];  Service: Plastics;  Laterality: N/A;   DILATION AND CURETTAGE OF UTERUS  Feb 2004   s/p for 1st trimester nonviable pregnancy   EYE SURGERY     sty under eyelid   INCISE AND DRAIN ABCESS  Nov 03   s/p I &D for righ inframmary fold hidradenitis   INCISION AND DRAINAGE PERITONSILLAR ABSCESS  Mar 03   IR CV LINE INJECTION   06/07/2018   IR IMAGING GUIDED PORT INSERTION  06/20/2018   IR REMOVAL TUN ACCESS W/ PORT W/O FL MOD SED  06/20/2018   IRRIGATION AND DEBRIDEMENT ABSCESS  01/31/2012   Procedure: IRRIGATION AND DEBRIDEMENT ABSCESS;  Surgeon: Shann Medal, MD;  Location: [facility];  Service: General;  Laterality: Right;  right breast and axilla    NEPHROLITHOTOMY  10/02/2012   Procedure: NEPHROLITHOTOMY PERCUTANEOUS;  Surgeon: Alexis Frock, MD;  Location[facility];  Service: Urology;  Laterality: Right;  First Stage Percutaneous Nephrolithotomy with Surgeon Access, Left Ureteral Stent     NEPHROLITHOTOMY  10/04/2012  Procedure: NEPHROLITHOTOMY PERCUTANEOUS SECOND LOOK;  Surgeon: Alexis Frock, MD;  Location[facility];  Service: Urology;  Laterality: Right;      NEPHROLITHOTOMY  10/08/2012   Procedure: NEPHROLITHOTOMY PERCUTANEOUS;  Surgeon: Alexis Frock, MD;  Location[facility];  Service: Urology;  Laterality: Right;  THIRD STAGE, nephrostomy tube exchange x 2   NEPHROLITHOTOMY  10/11/2012   Procedure: NEPHROLITHOTOMY PERCUTANEOUS SECOND LOOK;  Surgeon: Alexis Frock, MD;  Location[facility];  Service: Urology;  Laterality: Right;  RIGHT 4 STAGE PERCUTANOUS NEPHROLITHOTOMY, right URETEROSCOPY WITH HOLMIUM LASER    PANNICULECTOMY N/A 12/21/2020   Procedure: PANNICULECTOMY;  Surgeon: Wallace Going, DO;  Location: [facility];  Service: Plastics;  Laterality: N/A;   PORT-A-CATH REMOVAL N/A 07/16/2020   Procedure: REMOVAL PORT-A-CATH;  Surgeon: Erroll Luna, MD;  Location: [facility];  Service: General;  Laterality: N/A;   PORTACATH PLACEMENT Left 05/17/2018   Procedure: INSERTION PORT-A-CATH;  Surgeon: Coralie Keens, MD;  Location: [facility];  Service: General;  Laterality: Left;   RADICAL NECK DISSECTION  04/12/2017   limited/notes 04/12/2017   RADICAL NECK DISSECTION N/A 04/12/2017   Procedure: RADICAL NECK DISSECTION;  Surgeon: Melida Quitter, MD;  Location: [facility];  Service: ENT;   Laterality: N/A;  limited neck dissection 2 hours total   REDUCTION MAMMAPLASTY Bilateral 1998   RIGHT/LEFT HEART CATH AND CORONARY ANGIOGRAPHY N/A 03/12/2020   Procedure: RIGHT/LEFT HEART CATH AND CORONARY ANGIOGRAPHY;  Surgeon: Jolaine Artist, MD;  Location: [facility];  Service: Cardiovascular;  Laterality: N/A;   [place]  04/12/2017   completion/notes 04/12/2017   THYROIDECTOMY N/A 04/12/2017   Procedure: THYROIDECTOMY;  Surgeon: Melida Quitter, MD;  Location: [facility];  Service: ENT;  Laterality: N/A;  Completion Thyroidectomy   TOTAL THYROIDECTOMY  2010     Current Outpatient Medications  Medication Sig Dispense Refill   albuterol (VENTOLIN HFA) 108 (90 Base) MCG/ACT inhaler Inhale 2 puffs into the lungs every 6 (six) hours as needed for wheezing or shortness of breath. 8 g 2   allopurinol (ZYLOPRIM) 100 MG tablet TAKE 1 TABLET (100 MG TOTAL) BY MOUTH DAILY. FOR GOUT PREVENTION. 90 tablet 1   Blood Glucose Monitoring Suppl (FREESTYLE LITE) w/Device KIT Inject 1 each into the skin daily. 1 kit 0   candesartan (ATACAND) 32 MG tablet Take 1 tablet (32 mg total) by mouth daily. 30 tablet 11   carvedilol (COREG) 25 MG tablet TAKE 1 TABLET BY MOUTH TWICE A DAY 180 tablet 0   cetirizine (ZYRTEC) 10 MG tablet Take 1 tablet by mouth at bedtime for allergies. 90 tablet 0   Cholecalciferol (VITAMIN D) 50 MCG (2000 UT) tablet Take 4,000 Units by mouth daily.     dolutegravir ([name]) 50 MG tablet Take 1 tablet (50 mg total) by mouth daily. 30 tablet 11   doxycycline (VIBRAMYCIN) 100 MG capsule Take 100 mg by mouth daily.     empagliflozin (JARDIANCE) 10 MG TABS tablet Take 1 tablet (10 mg total) by mouth daily before breakfast. 30 tablet 6   emtricitabine-tenofovir AF (DESCOVY) 200-25 MG tablet Take 1 tablet by mouth daily. 30 tablet 11   famotidine (PEPCID) 20 MG tablet TAKE 1 OR 2 TABLETS BY MOUTH AT BEDTIME FOR HOARSE VOICE/REFLUX 180 tablet 1   FLOVENT HFA 110  MCG/ACT inhaler TAKE 1 PUFF BY MOUTH TWICE A DAY 36 Inhaler 1   fluticasone (FLONASE) 50 MCG/ACT nasal spray Place 2 sprays into both nostrils  daily. 16 g 6   gabapentin (NEURONTIN) 300 MG capsule Take 300 mg by mouth 2 (two) times daily.     glipiZIDE (GLUCOTROL) 5 MG tablet Take by mouth 2 (two) times daily. For diabetes.     glucose blood (ONETOUCH VERIO) test strip USE AS INSTRUCTED TO TEST BLOOD SUGAR DAILY 100 each 2   Lancets (ONETOUCH ULTRASOFT) lancets Use as instructed to test blood sugar daily 100 each 5   omeprazole (PRILOSEC) 40 MG capsule TAKE 1 CAPSULE BY MOUTH DAILY FOR REFLUX 90 capsule 0   potassium chloride SA (KLOR-CON) 20 MEQ tablet Take 20 mEq by mouth daily.     rosuvastatin (CRESTOR) 10 MG tablet TAKE 1 TABLET BY MOUTH EVERY DAY FOR CHOLESTEROL 90 tablet 3   sertraline (ZOLOFT) 50 MG tablet TAKE 1 TABLET (50 MG TOTAL) BY MOUTH DAILY. FOR ANXIETY AND DEPRESSION. 90 tablet 1   sitaGLIPtin (JANUVIA) 50 MG tablet Take 1 tablet (50 mg total) by mouth daily. For diabetes. 90 tablet 1   spironolactone (ALDACTONE) 25 MG tablet Take 1 tablet (25 mg total) by mouth daily. 90 tablet 3   SYNTHROID 150 MCG tablet Take 1 tablet (150 mcg total) by mouth daily before breakfast. 90 tablet 1   tamoxifen (NOLVADEX) 20 MG tablet Take 1 tablet (20 mg total) by mouth daily. 90 tablet 4   No current facility-administered medications for this encounter.   Facility-Administered Medications Ordered in Other Encounters  Medication Dose Route Frequency Provider Last Rate Last Admin   sodium chloride flush (NS) 0.9 % injection 10 mL  10 mL Intracatheter PRN Magrinat, Virgie Dad, MD   10 mL at 09/19/18 1551    Allergies:   Genvoya [elviteg-cobic-emtricit-tenofaf] and Lisinopril   Social History:  The patient  reports that she quit smoking about 3 years ago. Her smoking use included cigarettes. She started smoking about 4 years ago. She has a 7.50 pack-year smoking history. She has never used  smokeless tobacco. She reports current alcohol use. She reports that she does not use drugs.   Family History:  The patient's family history includes Breast cancer in her cousin, cousin, cousin, maternal aunt, paternal aunt, and paternal aunt; Breast cancer (age of onset: 28) in her cousin; Breast cancer (age of onset: 15) in her paternal aunt; Breast cancer (age of onset: 42) in her maternal aunt; Cancer in her maternal uncle and mother; Diabetes in an other family member; Heart disease in her mother and another family member; Hypertension in her brother, father, mother, sister, sister, and another family member; Kidney disease in an other family member; Lung cancer in her paternal uncle; Lung cancer (age of onset: 63) in her father; Prostate cancer in her paternal uncle and paternal uncle; Stroke in an other family member.   ROS:  Please see the history of present illness.   All other systems are personally reviewed and negative.   Vitals:   08/31/21 1131  BP: (!) 148/90  Pulse: 66  SpO2: 98%    Exam:  General:  Well appearing. No resp difficulty HEENT: normal Neck: supple. no JVD. Carotids 2+ bilat; no bruits. No lymphadenopathy or thryomegaly appreciated. Cor: PMI nondisplaced. Regular rate & rhythm. No rubs, gallops or murmurs. Lungs: clear Abdomen: obese soft, nontender, nondistended. No hepatosplenomegaly. No bruits or masses. Good bowel sounds. Extremities: no cyanosis, clubbing, rash, edema Neuro: alert & orientedx3, cranial nerves grossly intact. moves all 4 extremities w/o difficulty. Affect pleasant   Recent Labs: 07/01/2021: TSH 23.96 07/27/2021:  ALT 19; BUN 27; Creatinine 2.72; Hemoglobin 11.7; Platelet Count 228; Potassium 4.0; Sodium 140  Personally reviewed   Wt Readings from Last 3 Encounters:  08/31/21 107.6 kg (237 lb 3.2 oz)  07/27/21 105 kg (231 lb 8 oz)  07/01/21 104.2 kg (229 lb 12.8 oz)      ASSESSMENT AND PLAN:  1. Dyspnea - echo 9/20  EF 55-60% Grade I  DD  - echo 5/21 EF 55-60% Grade I DD - Quit smoking 2019 - Initial ReDS 43% suggesting elevated lung water. F/u ReDS was 39%.  Arlyce Harman stopped to A/CKD - Volume status ok on exam - R/L cath in 5/21 with normal filling pressures and CO. Mild PH - CPX ordered in 5/21 but never completed - Overall improved. Can re-consider CPX as needed  2. R Breast Cancer - EF and Doppler parameters stabl e and echo  - Has finished Herceptin 1/21 - Echo 5/21 Echo 55-60%   3. HTN - Blood pressure remains elevated but didn't take meds today  4. Acute on CKD IV - baseline creatinine 1.5-1.9.  - most recent creatinine 2.7 - Follows with [facility] (Dr. Posey Pronto) - Continue Jardiance  5. Snoring with mild OSA - PSG 10/21 AHI 10.4  - refer to Dr. Radford Pax  6. DM2 - last HGBa1c 7.1 - Continue Jardiance  7. Obesity - encouraged weight loss    8. Coronary calcifications on CT - cath 5/21. No CAD  Signed, Glori Bickers, MD  08/31/2021 11:45 AM  [facility] [place] [address] [phone] (office) [phone] (fax)

## 2021-08-31 NOTE — Patient Instructions (Signed)
CONGRATULATIONS!!!! You have graduated from the [facility]  Please continue to follow up with [facility] for your BP control and sleep apnea

## 2021-08-31 NOTE — Addendum Note (Signed)
Encounter addended by: Scarlette Calico, RN on: 08/31/2021 12:10 PM  Actions taken: Clinical Note Signed

## 2021-09-06 ENCOUNTER — Encounter: Payer: Self-pay | Admitting: Neurology

## 2021-09-06 ENCOUNTER — Ambulatory Visit (INDEPENDENT_AMBULATORY_CARE_PROVIDER_SITE_OTHER): Payer: 59 | Admitting: Neurology

## 2021-09-06 ENCOUNTER — Other Ambulatory Visit: Payer: Self-pay

## 2021-09-06 VITALS — BP 168/116 | HR 74 | Ht 65.0 in | Wt 235.0 lb

## 2021-09-06 DIAGNOSIS — R292 Abnormal reflex: Secondary | ICD-10-CM

## 2021-09-06 DIAGNOSIS — T451X5A Adverse effect of antineoplastic and immunosuppressive drugs, initial encounter: Secondary | ICD-10-CM | POA: Diagnosis not present

## 2021-09-06 DIAGNOSIS — G620 Drug-induced polyneuropathy: Secondary | ICD-10-CM

## 2021-09-06 DIAGNOSIS — M4802 Spinal stenosis, cervical region: Secondary | ICD-10-CM

## 2021-09-06 NOTE — Patient Instructions (Addendum)
MRI cervical spine without contrast    

## 2021-09-06 NOTE — Progress Notes (Addendum)
[facility] Clinic Note - Initial Visit   Date: 09/06/21  [name] MRN: [id number] DOB: 30-May-1966   Dear Alma Friendly, NP:  Thank you for your kind referral of [name] for consultation of paresthesias. Although her history is well known to you, please allow [place] to reiterate it for the purpose of our medical record. The patient was accompanied to the clinic by self.   History of Present Illness: [name] is a 55 y.o. right-handed female with HIV (2005), CKD, thyroid cancer (2017), right breast cancer s/p lumpectomy, chemotherapy and radiation (2019), gout, depression/anxiety, hyperlipidemia, and hypertension, presenting for evaluation of paresthesias.   She underwent chemotherapy in 2019 for breast cancer and began having tingling and burning sensation involving the finger tips. Later she developed similar symptoms in the feet. Her has some imbalance. She has fallen about 4 times this year.  She has weakness in the hands and finds it harder to braid her hair.  She takes gabapentin $RemoveBeforeDE'300mg'JqSwhShQGDNHRME$  twice daily which helps.  She complains of right middle finger and left thumb pain.   Out-side paper records, electronic medical record, and images have been reviewed where available and summarized as:  Lab Results  Component Value Date   HGBA1C 6.1 (A) 05/19/2021   Lab Results  Component Value Date   SFKCLEXN17 001 07/01/2021   Lab Results  Component Value Date   TSH 23.96 (H) 07/01/2021   Lab Results  Component Value Date   ESRSEDRATE 82 (H) 07/01/2021    Past Medical History:  Diagnosis Date   Allergy    Anemia    Normocytic   Anxiety    Asthma    Blood dyscrasia    Bronchitis 2005   CKD (chronic kidney disease)    CLASS 1-EXOPHTHALMOS-THYROTOXIC 02/08/2007   Diabetes mellitus without complication ([place])    Family history of breast cancer    Family history of lung cancer    Family history of prostate cancer     Gastroenteritis 07/10/07   GERD 07/24/2006   GRAVE'S DISEASE 01/01/2008   History of hidradenitis suppurativa    History of kidney stones    History of thrush    HIV DISEASE 07/24/2006   dx March 05   Hyperlipidemia    HYPERTENSION 07/24/2006   Hyperthyroidism 08/2006   Grave's Disease -diffuse radiotracer uptake 08/25/06 Thyroid scan-Cold nodule to R lower lobe of thyrorid   Menometrorrhagia    hx of   Nephrolithiasis    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 Completed 2 yrs Prednisone after lung bx confirmation. No symptoms since then.   Suppurative hidradenitis    Thyroid cancer ([place])    THYROID NODULE, RIGHT 02/08/2007    Past Surgical History:  Procedure Laterality Date   APPLICATION OF WOUND VAC N/A 01/20/2021   Procedure: APPLICATION OF WOUND VAC;  Surgeon: Wallace Going, DO;  Location: [facility];  Service: Plastics;  Laterality: N/A;   BREAST EXCISIONAL BIOPSY Right 04/26/2018   right axilla negative   BREAST EXCISIONAL BIOPSY Left 04/26/2018   left axilla  negative   BREAST LUMPECTOMY Right 10/03/2018   malignant   BREAST LUMPECTOMY WITH RADIOACTIVE SEED AND SENTINEL LYMPH NODE BIOPSY Right 10/03/2018   Procedure: RIGHT BREAST LUMPECTOMY WITH RADIOACTIVE SEED AND SENTINEL LYMPH NODE MAPPING;  Surgeon: Erroll Luna, MD;  Location: [facility];  Service: General;  Laterality: Right;   BREAST SURGERY  1997   Breast Reduction    CYSTOSCOPY W/ URETERAL STENT REMOVAL  11/09/2012   Procedure: CYSTOSCOPY WITH STENT REMOVAL;  Surgeon: Alexis Frock, MD;  Location:  [facility];  Service: Urology;  Laterality: Right;   CYSTOSCOPY WITH RETROGRADE PYELOGRAM, URETEROSCOPY AND STENT PLACEMENT  11/09/2012   Procedure: CYSTOSCOPY WITH RETROGRADE PYELOGRAM, URETEROSCOPY AND STENT PLACEMENT;  Surgeon: Alexis Frock, MD;  Location: [facility];  Service: Urology;  Laterality: Left;  LEFT URETEROSCOPY, STONE MANIPULATION, left STENT exchange    CYSTOSCOPY WITH STENT PLACEMENT  10/02/2012   Procedure: CYSTOSCOPY WITH STENT PLACEMENT;  Surgeon: Alexis Frock, MD;  Location: [facility];  Service: Urology;  Laterality: Left;   DEBRIDEMENT AND CLOSURE WOUND N/A 01/20/2021   Procedure: Excision of abdominal wound with closure;  Surgeon: Wallace Going, DO;  Location: [facility];  Service: Plastics;  Laterality: N/A;   DILATION AND CURETTAGE OF UTERUS  Feb 2004   s/p for 1st trimester nonviable pregnancy   EYE SURGERY     sty under eyelid   INCISE AND DRAIN ABCESS  Nov 03   s/p I &D for righ inframmary fold hidradenitis   INCISION AND DRAINAGE PERITONSILLAR ABSCESS  Mar 03   IR CV LINE INJECTION  06/07/2018   IR IMAGING GUIDED PORT INSERTION  06/20/2018   IR REMOVAL TUN ACCESS W/ PORT W/O FL MOD SED  06/20/2018   IRRIGATION AND DEBRIDEMENT ABSCESS  01/31/2012   Procedure: IRRIGATION AND DEBRIDEMENT ABSCESS;  Surgeon: Shann Medal, MD;  Location: [facility];  Service: General;  Laterality: Right;  right breast and axilla    NEPHROLITHOTOMY  10/02/2012   Procedure: NEPHROLITHOTOMY PERCUTANEOUS;  Surgeon: Alexis Frock, MD;  Location: [facility];  Service: Urology;  Laterality: Right;  First Stage Percutaneous Nephrolithotomy with Surgeon Access, Left Ureteral Stent     NEPHROLITHOTOMY  10/04/2012   Procedure: NEPHROLITHOTOMY PERCUTANEOUS SECOND LOOK;  Surgeon: Alexis Frock, MD;  Location[facility];  Service: Urology;  Laterality: Right;      NEPHROLITHOTOMY  10/08/2012   Procedure: NEPHROLITHOTOMY PERCUTANEOUS;  Surgeon: Alexis Frock, MD;  Location[facility];  Service:  Urology;  Laterality: Right;  THIRD STAGE, nephrostomy tube exchange x 2   NEPHROLITHOTOMY  10/11/2012   Procedure: NEPHROLITHOTOMY PERCUTANEOUS SECOND LOOK;  Surgeon: Alexis Frock, MD;  [facility];  Service: Urology;  Laterality: Right;  RIGHT 4 STAGE PERCUTANOUS NEPHROLITHOTOMY, right URETEROSCOPY WITH HOLMIUM LASER    PANNICULECTOMY N/A 12/21/2020   Procedure: PANNICULECTOMY;  Surgeon: Wallace Going, DO;  Location: [facility];  Service: Plastics;  Laterality: N/A;   PORT-A-CATH REMOVAL N/A 07/16/2020   Procedure: REMOVAL PORT-A-CATH;  Surgeon: Erroll Luna, MD;  Location: [place];  Service: General;  Laterality: N/A;   PORTACATH PLACEMENT Left 05/17/2018   Procedure: INSERTION PORT-A-CATH;  Surgeon: Coralie Keens, MD;  Location: [facility];  Service: General;  Laterality: Left;   RADICAL NECK DISSECTION  04/12/2017   limited/notes 04/12/2017   RADICAL NECK DISSECTION N/A 04/12/2017   Procedure: RADICAL NECK DISSECTION;  Surgeon: Melida Quitter, MD;  Location: [facility];  Service: ENT;  Laterality: N/A;  limited neck dissection 2  hours total   REDUCTION MAMMAPLASTY Bilateral 1998   RIGHT/LEFT HEART CATH AND CORONARY ANGIOGRAPHY N/A 03/12/2020   Procedure: RIGHT/LEFT HEART CATH AND CORONARY ANGIOGRAPHY;  Surgeon: Jolaine Artist, MD;  Location: [facility];  Service: Cardiovascular;  Laterality: N/A;   [place]  04/12/2017   completion/notes 04/12/2017   THYROIDECTOMY N/A 04/12/2017   Procedure: THYROIDECTOMY;  Surgeon: Melida Quitter, MD;  Location: [facility];  Service: ENT;  Laterality: N/A;  Completion Thyroidectomy   TOTAL THYROIDECTOMY  2010     Medications:  Outpatient Encounter Medications as of 09/06/2021  Medication Sig   albuterol (VENTOLIN HFA) 108 (90 Base) MCG/ACT inhaler Inhale 2 puffs into the lungs every 6 (six) hours as needed for wheezing or shortness of breath.   allopurinol (ZYLOPRIM) 100 MG tablet TAKE 1  TABLET (100 MG TOTAL) BY MOUTH DAILY. FOR GOUT PREVENTION.   Blood Glucose Monitoring Suppl (FREESTYLE LITE) w/Device KIT Inject 1 each into the skin daily.   candesartan (ATACAND) 32 MG tablet Take 1 tablet (32 mg total) by mouth daily.   carvedilol (COREG) 25 MG tablet TAKE 1 TABLET BY MOUTH TWICE A DAY   cetirizine (ZYRTEC) 10 MG tablet Take 1 tablet by mouth at bedtime for allergies.   Cholecalciferol (VITAMIN D) 50 MCG (2000 UT) tablet Take 4,000 Units by mouth daily.   dolutegravir ([name]) 50 MG tablet Take 1 tablet (50 mg total) by mouth daily.   doxycycline (VIBRAMYCIN) 100 MG capsule Take 100 mg by mouth daily.   empagliflozin (JARDIANCE) 10 MG TABS tablet Take 1 tablet (10 mg total) by mouth daily before breakfast.   emtricitabine-tenofovir AF (DESCOVY) 200-25 MG tablet Take 1 tablet by mouth daily.   famotidine (PEPCID) 20 MG tablet TAKE 1 OR 2 TABLETS BY MOUTH AT BEDTIME FOR HOARSE VOICE/REFLUX   FLOVENT HFA 110 MCG/ACT inhaler TAKE 1 PUFF BY MOUTH TWICE A DAY   fluticasone (FLONASE) 50 MCG/ACT nasal spray Place 2 sprays into both nostrils daily.   gabapentin (NEURONTIN) 300 MG capsule Take 300 mg by mouth 2 (two) times daily.   glipiZIDE (GLUCOTROL) 5 MG tablet Take by mouth 2 (two) times daily. For diabetes.   glucose blood (ONETOUCH VERIO) test strip USE AS INSTRUCTED TO TEST BLOOD SUGAR DAILY   Lancets (ONETOUCH ULTRASOFT) lancets Use as instructed to test blood sugar daily   omeprazole (PRILOSEC) 40 MG capsule TAKE 1 CAPSULE BY MOUTH DAILY FOR REFLUX   potassium chloride SA (KLOR-CON) 20 MEQ tablet Take 20 mEq by mouth daily.   rosuvastatin (CRESTOR) 10 MG tablet TAKE 1 TABLET BY MOUTH EVERY DAY FOR CHOLESTEROL   sertraline (ZOLOFT) 50 MG tablet TAKE 1 TABLET (50 MG TOTAL) BY MOUTH DAILY. FOR ANXIETY AND DEPRESSION.   sitaGLIPtin (JANUVIA) 50 MG tablet Take 1 tablet (50 mg total) by mouth daily. For diabetes.   spironolactone (ALDACTONE) 25 MG tablet Take 1 tablet (25 mg  total) by mouth daily.   SYNTHROID 150 MCG tablet Take 1 tablet (150 mcg total) by mouth daily before breakfast.   tamoxifen (NOLVADEX) 20 MG tablet Take 1 tablet (20 mg total) by mouth daily.   [DISCONTINUED] prochlorperazine (COMPAZINE) 10 MG tablet TAKE 1 TABLET BY MOUTH EVERY 6 HOURS AS NEEDED FOR NAUSEA OR VOMITING   Facility-Administered Encounter Medications as of 09/06/2021  Medication   sodium chloride flush (NS) 0.9 % injection 10 mL    Allergies:  Allergies  Allergen Reactions   Genvoya [Elviteg-Cobic-Emtricit-Tenofaf] Hives  Lisinopril Cough    Family History: Family History  Problem Relation Age of Onset   Hypertension Mother    Cancer Mother        laryngeal   Heart disease Mother        stent   Hypertension Father    Lung cancer Father 32       hx smoking   Heart disease Other    Hypertension Other    Stroke Other        Grandparent   Kidney disease Other        Grandparent   Diabetes Other        FH of Diabetes   Hypertension Sister    Cancer Maternal Uncle        Lung CA   Hypertension Brother    Hypertension Sister    Breast cancer Maternal Aunt 65   Breast cancer Paternal Aunt 45   Prostate cancer Paternal Uncle    Breast cancer Maternal Aunt        dx 60+   Breast cancer Paternal Aunt        dx 48's   Breast cancer Paternal 59        dx 27's   Prostate cancer Paternal Uncle    Lung cancer Paternal Uncle    Breast cancer Cousin 64   Breast cancer Cousin        dx <50   Breast cancer Cousin        dx <50   Breast cancer Cousin        dx <50   Colon cancer Neg Hx    Esophageal cancer Neg Hx    Rectal cancer Neg Hx    Stomach cancer Neg Hx     Social History: Social History   Tobacco Use   Smoking status: Former    Packs/day: 0.50    Years: 15.00    Pack years: 7.50    Types: Cigarettes    Start date: 04/12/2017    Quit date: 2019    Years since quitting: 3.8   Smokeless tobacco: Never  Vaping Use   Vaping Use: Never  used  Substance Use Topics   Alcohol use: Yes    Alcohol/week: 0.0 standard drinks    Comment: social   Drug use: No   Social History   Social History Narrative   Right Handed    Lives in a two story home    Vital Signs:  BP (!) 180/110   Pulse 74   Ht $R'5\' 5"'Ej$  (1.651 m)   Wt 235 lb (106.6 kg)   LMP 03/31/2014 (LMP Unknown)   SpO2 97%   BMI 39.11 kg/m   Neurological Exam: MENTAL STATUS including orientation to time, place, person, recent and remote memory, attention span and concentration, language, and fund of knowledge is normal.  Speech is not dysarthric.  CRANIAL NERVES: II:  No visual field defects.    III-IV-VI: Pupils equal round and reactive to light.  Normal conjugate, extra-ocular eye movements in all directions of gaze.  No nystagmus.  No ptosis.   V:  Normal facial sensation.    VII:  Normal facial symmetry and movements.   VIII:  Normal hearing and vestibular function.   IX-X:  Normal palatal movement.   XI:  Normal shoulder shrug and head rotation.   XII:  Normal tongue strength and range of motion, no deviation or fasciculation.  MOTOR:  No atrophy, fasciculations or abnormal movements.  No pronator drift.  Upper Extremity:  Right  Left  Deltoid  5/5   5/5   Biceps  5/5   5/5   Triceps  5/5   5/5   Infraspinatus 5/5  5/5  Medial pectoralis 5/5  5/5  Wrist extensors  5/5   5/5   Wrist flexors  5/5   5/5   Finger extensors  5/5   5/5   Finger flexors  5-/5   5-/5   Dorsal interossei  5-/5   5-/5   Abductor pollicis  5-/5   5-/5   Tone (Ashworth scale)  0  0   Lower Extremity:  Right  Left  Hip flexors  5/5   5/5   Hip extensors  5/5   5/5   Adductor 5/5  5/5  Abductor 5/5  5/5  Knee flexors  5/5   5/5   Knee extensors  5/5   5/5   Dorsiflexors  5/5   5/5   Plantarflexors  5/5   5/5   Toe extensors  5/5   5/5   Toe flexors  5/5   5/5   Tone (Ashworth scale)  0  0   MSRs:  Right        Left                  brachioradialis 3+  3+  biceps  3+  3+  triceps 3+  3+  patellar 2+  2+  ankle jerk 2+  2+  Hoffman no  no  plantar response down  down   SENSORY:  Normal and symmetric perception of light touch, pinprick, vibration, and proprioception.  Romberg's sign absent.   COORDINATION/GAIT: Normal finger-to- nose-finger.  Intact rapid alternating movements bilaterally.  Gait narrow based and stable. Mild unsteadiness with tandem and stressed gait.   IMPRESSION: Hyperreflexia - need to evaluate for cervical canal stenosis with her upper extremity paresthesias and weakness  - MRI cervical spine wo contrast 2. Neuropathy -risk factors: HIV, cancer, chemotherapy  - Consider NCS/EMG of the arm and leg, pending results 3.?Overlapping arthritis vs tendinopathy affecting the right middle finger and left thumb 4. Elevated BP, asymptomatic. Rechecked and still high 168/116. Recommend follow-up with PCP/cardiology.  Further recommendations pending results.    Thank you for allowing me to participate in patient's care.  If I can answer any additional questions, I would be pleased to do so.    Sincerely,    Valisha Heslin K. Posey Pronto, DO

## 2021-09-07 NOTE — Telephone Encounter (Signed)
Prior Authorization for titration sent to [facility] via Fax .

## 2021-09-11 ENCOUNTER — Other Ambulatory Visit: Payer: Self-pay | Admitting: Endocrinology

## 2021-09-11 ENCOUNTER — Other Ambulatory Visit: Payer: Self-pay | Admitting: Primary Care

## 2021-09-11 DIAGNOSIS — M1A09X0 Idiopathic chronic gout, multiple sites, without tophus (tophi): Secondary | ICD-10-CM

## 2021-09-11 DIAGNOSIS — K219 Gastro-esophageal reflux disease without esophagitis: Secondary | ICD-10-CM

## 2021-09-11 DIAGNOSIS — R490 Dysphonia: Secondary | ICD-10-CM

## 2021-09-11 DIAGNOSIS — F411 Generalized anxiety disorder: Secondary | ICD-10-CM

## 2021-09-11 DIAGNOSIS — E119 Type 2 diabetes mellitus without complications: Secondary | ICD-10-CM

## 2021-09-11 DIAGNOSIS — F331 Major depressive disorder, recurrent, moderate: Secondary | ICD-10-CM

## 2021-09-15 ENCOUNTER — Other Ambulatory Visit: Payer: Self-pay

## 2021-09-15 ENCOUNTER — Ambulatory Visit (INDEPENDENT_AMBULATORY_CARE_PROVIDER_SITE_OTHER): Payer: 59 | Admitting: Pharmacist Clinician (PhC)/ Clinical Pharmacy Specialist

## 2021-09-15 DIAGNOSIS — I10 Essential (primary) hypertension: Secondary | ICD-10-CM | POA: Diagnosis not present

## 2021-09-15 MED ORDER — AMLODIPINE BESYLATE 5 MG PO TABS: 5.0000 mg | ORAL_TABLET | Freq: Every day | ORAL | 6 refills | Status: DC

## 2021-09-15 NOTE — Progress Notes (Signed)
09/15/2021 [name] Mar 28, 1966 [id number]   HPI:  [name] is a 55 y.o. female patient of Dr Oval Linsey, with a PMH below who presents today for advanced hypertension follow up.  She was referred to our clinic by Dr. Haroldine Laws, who sees her for cardio-oncology.  originally seen by Dr. Oval Linsey last fall, with a pressure of 162/102. At that visit she noted that home readings were mostly 491-791'T systolic, however the diastolic was 05-697'X.  She had also just started prednisone for a gout flare at that time.  At follow up visits she continued to have problems with diastolic elevations.  At her last visit with me in August, it was determined that she was taking carvedilol only once daily in the mornings with her other meds.    Today she returns for follow up.  When she saw Dr. Haroldine Laws earlier this month, her pressure was 148/90.  She had not taken her morning meds, so no changes were made.  Today she notes that home readings have been as high as 189/110 at home over the past few weeks.  No actual readings with her.  She did increase the carvedilol to twice daily, but only about a week ago.  States she is still stressed somewhat, as she continues to fight about her disability.  Had a good Thanksgiving holiday with her parents and siblings (71 in total with 3 generations) and is feeling happy with her family life.     Past Medical History: hyperlipidemia 6/22 - LDL 71 on rosuvastatin 10 mg daily  Breast cancer Now on tamoxifen  DM2 8/22 A1c 6.1 on Jardiance 10 mg  CKD3 6/22 SCr 2.74 CrCl with adjusted body weight - 28  hypothroidism 7/22 TSH 20.04 - on levothyroxine 112 mcg (needs f/u with Endo)     Blood Pressure Goal:  130/80  Current Medications: candesartan 32 mg qd, carvedilol 25 mg bid, spironolactone 25 mg qd  Family Hx: mother with heart disease; both parents and multiple siblings with hypertension, breast cancer in 3 relatives, (both paternal and  maternal)  Social Hx: no tobacco, only occasional alcohol; no regular caffeine intake  Diet: usually eats 2 meals per day, has some intolerances to smells, so avoids restaurants, does not add salt to her foods.  Exercise: none  Home BP readings: no readings with her, but notes have been elevated at home  Intolerances: lisinopril - cough; hctz -gout flares  Labs: 10/22:  Na 140, K 4.0, Glu 104, SCr 2.72, GFR < 20   Wt Readings from Last 3 Encounters:  09/15/21 240 lb 12.8 oz (109.2 kg)  09/06/21 235 lb (106.6 kg)  08/31/21 237 lb 3.2 oz (107.6 kg)   BP Readings from Last 3 Encounters:  09/15/21 (!) 166/110  09/06/21 (!) 168/116  08/31/21 (!) 148/90   Pulse Readings from Last 3 Encounters:  09/15/21 64  09/06/21 74  08/31/21 66    Current Outpatient Medications  Medication Sig Dispense Refill   amLODipine (NORVASC) 5 MG tablet Take 1 tablet (5 mg total) by mouth daily. 30 tablet 6   albuterol (VENTOLIN HFA) 108 (90 Base) MCG/ACT inhaler Inhale 2 puffs into the lungs every 6 (six) hours as needed for wheezing or shortness of breath. 8 g 2   allopurinol (ZYLOPRIM) 100 MG tablet TAKE 1 TABLET BY MOUTH DAILY FOR GOUT PREVENTION 90 tablet 0   Blood Glucose Monitoring Suppl (FREESTYLE LITE) w/Device KIT Inject 1 each into the skin daily. 1 kit  0   candesartan (ATACAND) 32 MG tablet Take 1 tablet (32 mg total) by mouth daily. 30 tablet 11   carvedilol (COREG) 25 MG tablet TAKE 1 TABLET BY MOUTH TWICE A DAY 180 tablet 0   cetirizine (ZYRTEC) 10 MG tablet Take 1 tablet by mouth at bedtime for allergies. 90 tablet 0   Cholecalciferol (VITAMIN D) 50 MCG (2000 UT) tablet Take 4,000 Units by mouth daily.     dolutegravir ([name]) 50 MG tablet Take 1 tablet (50 mg total) by mouth daily. 30 tablet 11   doxycycline (VIBRAMYCIN) 100 MG capsule Take 100 mg by mouth daily.     empagliflozin (JARDIANCE) 10 MG TABS tablet Take 1 tablet (10 mg total) by mouth daily before breakfast. 30 tablet 6    emtricitabine-tenofovir AF (DESCOVY) 200-25 MG tablet Take 1 tablet by mouth daily. 30 tablet 11   famotidine (PEPCID) 20 MG tablet TAKE 1 TO 2 TABLETS BY MOUTH AT BEDTIME FOR HOARSE VOICE/REFLUX 180 tablet 0   FLOVENT HFA 110 MCG/ACT inhaler TAKE 1 PUFF BY MOUTH TWICE A DAY 36 Inhaler 1   fluticasone (FLONASE) 50 MCG/ACT nasal spray Place 2 sprays into both nostrils daily. 16 g 6   gabapentin (NEURONTIN) 300 MG capsule Take 300 mg by mouth 2 (two) times daily.     glipiZIDE (GLUCOTROL) 5 MG tablet TAKE 1 TABLET (5 MG TOTAL) BY MOUTH 2 (TWO) TIMES DAILY BEFORE A MEAL. FOR DIABETES. 180 tablet 0   glucose blood (ONETOUCH VERIO) test strip USE AS INSTRUCTED TO TEST BLOOD SUGAR DAILY 100 each 2   Lancets (ONETOUCH ULTRASOFT) lancets Use as instructed to test blood sugar daily 100 each 5   levothyroxine (SYNTHROID) 112 MCG tablet TAKE 1 TABLET BY MOUTH EVERY DAY BEFORE BREAKFAST 30 tablet 0   omeprazole (PRILOSEC) 40 MG capsule TAKE 1 CAPSULE BY MOUTH DAILY FOR REFLUX 90 capsule 0   potassium chloride SA (KLOR-CON) 20 MEQ tablet Take 20 mEq by mouth daily.     rosuvastatin (CRESTOR) 10 MG tablet TAKE 1 TABLET BY MOUTH EVERY DAY FOR CHOLESTEROL 90 tablet 3   sertraline (ZOLOFT) 50 MG tablet TAKE 1 TABLET (50 MG TOTAL) BY MOUTH DAILY. FOR ANXIETY AND DEPRESSION. 90 tablet 0   sitaGLIPtin (JANUVIA) 50 MG tablet Take 1 tablet (50 mg total) by mouth daily. For diabetes. 90 tablet 1   spironolactone (ALDACTONE) 25 MG tablet Take 1 tablet (25 mg total) by mouth daily. 90 tablet 3   SYNTHROID 150 MCG tablet Take 1 tablet (150 mcg total) by mouth daily before breakfast. 90 tablet 1   tamoxifen (NOLVADEX) 20 MG tablet Take 1 tablet (20 mg total) by mouth daily. 90 tablet 4   No current facility-administered medications for this visit.   Facility-Administered Medications Ordered in Other Visits  Medication Dose Route Frequency Provider Last Rate Last Admin   sodium chloride flush (NS) 0.9 % injection 10 mL   10 mL Intracatheter PRN Magrinat, Valentino Hue, MD   10 mL at 09/19/18 1551    Allergies  Allergen Reactions   Genvoya [Elviteg-Cobic-Emtricit-Tenofaf] Hives   Lisinopril Cough    Past Medical History:  Diagnosis Date   Allergy    Anemia    Normocytic   Anxiety    Asthma    Blood dyscrasia    Bronchitis 2005   CKD (chronic kidney disease)    CLASS 1-EXOPHTHALMOS-THYROTOXIC 02/08/2007   Diabetes mellitus without complication ([facility])    Family history of breast cancer  Family history of lung cancer    Family history of prostate cancer    Gastroenteritis 07/10/07   GERD 07/24/2006   GRAVE'S DISEASE 01/01/2008   History of hidradenitis suppurativa    History of kidney stones    History of thrush    HIV DISEASE 07/24/2006   dx March 05   Hyperlipidemia    HYPERTENSION 07/24/2006   Hyperthyroidism 08/2006   Grave's Disease -diffuse radiotracer uptake 08/25/06 Thyroid scan-Cold nodule to R lower lobe of thyrorid   Menometrorrhagia    hx of   Nephrolithiasis    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 Completed 2 yrs Prednisone after lung bx confirmation. No symptoms since then.   Suppurative hidradenitis    Thyroid cancer ([place])    THYROID NODULE, RIGHT 02/08/2007    Blood pressure (!) 166/110, pulse 64, height $RemoveBe'5\' 5"'xQLsXLFLc$  (1.651 m), weight 240 lb 12.8 oz (109.2 kg), last menstrual period 03/31/2014.  Essential hypertension Patient with essential hypertension, still not at BP goal.  Will add amlodipine 5 mg once daily in the evenings and have her continue with other medications.  She will  try to be better about checking home numbers and keeping a log of these.  We will see her back in 6 weeks for follow up.     Tommy Medal PharmD CPP [facility] [address] [place], [place] [address] [phone]

## 2021-09-15 NOTE — Patient Instructions (Signed)
Return for a a follow up appointment January 12 at 11 am  Check your blood pressure at home 3-4 times each week, and keep record of the readings.  Take your BP meds as follows:  AM: carvedilol, candesartan, spironolactone  PM: carvedilol, amlodipine  Bring all of your meds, your BP cuff and your record of home blood pressures to your next appointment.  Exercise as you're able, try to walk approximately 30 minutes per day.  Keep salt intake to a minimum, especially watch canned and prepared boxed foods.  Eat more fresh fruits and vegetables and fewer canned items.  Avoid eating in fast food restaurants.    HOW TO TAKE YOUR BLOOD PRESSURE: Rest 5 minutes before taking your blood pressure.  Don't smoke or drink caffeinated beverages for at least 30 minutes before. Take your blood pressure before (not after) you eat. Sit comfortably with your back supported and both feet on the floor (don't cross your legs). Elevate your arm to heart level on a table or a desk. Use the proper sized cuff. It should fit smoothly and snugly around your bare upper arm. There should be enough room to slip a fingertip under the cuff. The bottom edge of the cuff should be 1 inch above the crease of the elbow. Ideally, take 3 measurements at one sitting and record the average.

## 2021-09-15 NOTE — Assessment & Plan Note (Signed)
Patient with essential hypertension, still not at BP goal.  Will add amlodipine 5 mg once daily in the evenings and have her continue with other medications.  She will try to be better about checking home numbers and keeping a log of these.  We will see her back in 6 weeks for follow up.

## 2021-09-30 NOTE — Telephone Encounter (Addendum)
Prior Authorization for titration  sent to [facility] via Fax , DENIED [id number] NOT MET medical necessity. Will fax NPSG clinicals

## 2021-10-03 ENCOUNTER — Other Ambulatory Visit: Payer: 59

## 2021-10-07 ENCOUNTER — Other Ambulatory Visit: Payer: Self-pay | Admitting: Endocrinology

## 2021-10-12 ENCOUNTER — Ambulatory Visit: Payer: 59 | Admitting: Primary Care

## 2021-10-14 ENCOUNTER — Encounter: Payer: Self-pay | Admitting: Oncology

## 2021-10-22 ENCOUNTER — Inpatient Hospital Stay (HOSPITAL_COMMUNITY)
Admission: EM | Admit: 2021-10-22 | Discharge: 2021-10-25 | DRG: 439 | Disposition: A | Discharge status: Home / Self Care | Payer: 59 | Attending: Family Medicine | Admitting: Family Medicine

## 2021-10-22 ENCOUNTER — Encounter (HOSPITAL_COMMUNITY): Payer: Self-pay

## 2021-10-22 ENCOUNTER — Emergency Department (HOSPITAL_COMMUNITY): Payer: 59

## 2021-10-22 ENCOUNTER — Other Ambulatory Visit: Payer: Self-pay

## 2021-10-22 DIAGNOSIS — K859 Acute pancreatitis without necrosis or infection, unspecified: Secondary | ICD-10-CM | POA: Diagnosis not present

## 2021-10-22 DIAGNOSIS — Z79899 Other long term (current) drug therapy: Secondary | ICD-10-CM

## 2021-10-22 DIAGNOSIS — B20 Human immunodeficiency virus [HIV] disease: Secondary | ICD-10-CM | POA: Diagnosis present

## 2021-10-22 DIAGNOSIS — E890 Postprocedural hypothyroidism: Secondary | ICD-10-CM | POA: Diagnosis present

## 2021-10-22 DIAGNOSIS — E876 Hypokalemia: Secondary | ICD-10-CM | POA: Diagnosis present

## 2021-10-22 DIAGNOSIS — K219 Gastro-esophageal reflux disease without esophagitis: Secondary | ICD-10-CM | POA: Diagnosis present

## 2021-10-22 DIAGNOSIS — C50311 Malignant neoplasm of lower-inner quadrant of right female breast: Secondary | ICD-10-CM

## 2021-10-22 DIAGNOSIS — E1122 Type 2 diabetes mellitus with diabetic chronic kidney disease: Secondary | ICD-10-CM | POA: Diagnosis present

## 2021-10-22 DIAGNOSIS — Z170 Estrogen receptor positive status [ER+]: Secondary | ICD-10-CM

## 2021-10-22 DIAGNOSIS — E785 Hyperlipidemia, unspecified: Secondary | ICD-10-CM | POA: Diagnosis present

## 2021-10-22 DIAGNOSIS — Z6837 Body mass index (BMI) 37.0-37.9, adult: Secondary | ICD-10-CM

## 2021-10-22 DIAGNOSIS — E669 Obesity, unspecified: Secondary | ICD-10-CM | POA: Diagnosis present

## 2021-10-22 DIAGNOSIS — N390 Urinary tract infection, site not specified: Secondary | ICD-10-CM | POA: Diagnosis present

## 2021-10-22 DIAGNOSIS — Z853 Personal history of malignant neoplasm of breast: Secondary | ICD-10-CM

## 2021-10-22 DIAGNOSIS — Z79890 Hormone replacement therapy (postmenopausal): Secondary | ICD-10-CM

## 2021-10-22 DIAGNOSIS — I160 Hypertensive urgency: Secondary | ICD-10-CM | POA: Diagnosis present

## 2021-10-22 DIAGNOSIS — N179 Acute kidney failure, unspecified: Secondary | ICD-10-CM

## 2021-10-22 DIAGNOSIS — E1169 Type 2 diabetes mellitus with other specified complication: Secondary | ICD-10-CM

## 2021-10-22 DIAGNOSIS — Z21 Asymptomatic human immunodeficiency virus [HIV] infection status: Secondary | ICD-10-CM | POA: Diagnosis present

## 2021-10-22 DIAGNOSIS — Z9221 Personal history of antineoplastic chemotherapy: Secondary | ICD-10-CM

## 2021-10-22 DIAGNOSIS — I129 Hypertensive chronic kidney disease with stage 1 through stage 4 chronic kidney disease, or unspecified chronic kidney disease: Secondary | ICD-10-CM | POA: Diagnosis present

## 2021-10-22 DIAGNOSIS — B964 Proteus (mirabilis) (morganii) as the cause of diseases classified elsewhere: Secondary | ICD-10-CM | POA: Diagnosis present

## 2021-10-22 DIAGNOSIS — Z87891 Personal history of nicotine dependence: Secondary | ICD-10-CM

## 2021-10-22 DIAGNOSIS — Z8701 Personal history of pneumonia (recurrent): Secondary | ICD-10-CM

## 2021-10-22 DIAGNOSIS — N200 Calculus of kidney: Secondary | ICD-10-CM

## 2021-10-22 DIAGNOSIS — Z85850 Personal history of malignant neoplasm of thyroid: Secondary | ICD-10-CM

## 2021-10-22 DIAGNOSIS — Z923 Personal history of irradiation: Secondary | ICD-10-CM

## 2021-10-22 DIAGNOSIS — E119 Type 2 diabetes mellitus without complications: Secondary | ICD-10-CM

## 2021-10-22 DIAGNOSIS — I10 Essential (primary) hypertension: Secondary | ICD-10-CM | POA: Diagnosis present

## 2021-10-22 DIAGNOSIS — N184 Chronic kidney disease, stage 4 (severe): Secondary | ICD-10-CM | POA: Diagnosis present

## 2021-10-22 DIAGNOSIS — D631 Anemia in chronic kidney disease: Secondary | ICD-10-CM | POA: Diagnosis present

## 2021-10-22 DIAGNOSIS — Z20822 Contact with and (suspected) exposure to covid-19: Secondary | ICD-10-CM | POA: Diagnosis present

## 2021-10-22 LAB — CBC
HCT: 35.1 % — ABNORMAL LOW (ref 36.0–46.0)
Hemoglobin: 11.7 g/dL — ABNORMAL LOW (ref 12.0–15.0)
MCH: 31.4 pg (ref 26.0–34.0)
MCHC: 33.3 g/dL (ref 30.0–36.0)
MCV: 94.1 fL (ref 80.0–100.0)
Platelets: 238 10*3/uL (ref 150–400)
RBC: 3.73 MIL/uL — ABNORMAL LOW (ref 3.87–5.11)
RDW: 16.3 % — ABNORMAL HIGH (ref 11.5–15.5)
WBC: 8.7 10*3/uL (ref 4.0–10.5)
nRBC: 0 % (ref 0.0–0.2)

## 2021-10-22 LAB — BASIC METABOLIC PANEL
Anion gap: 8 (ref 5–15)
BUN: 27 mg/dL — ABNORMAL HIGH (ref 6–20)
CO2: 29 mmol/L (ref 22–32)
Calcium: 8.7 mg/dL — ABNORMAL LOW (ref 8.9–10.3)
Chloride: 98 mmol/L (ref 98–111)
Creatinine, Ser: 3.05 mg/dL — ABNORMAL HIGH (ref 0.44–1.00)
GFR, Estimated: 18 mL/min — ABNORMAL LOW (ref >60)
Glucose, Bld: 319 mg/dL — ABNORMAL HIGH (ref 70–99)
Potassium: 3.1 mmol/L — ABNORMAL LOW (ref 3.5–5.1)
Sodium: 135 mmol/L (ref 135–145)

## 2021-10-22 NOTE — ED Triage Notes (Signed)
Patient reports that she has had left flank pain x 17 days ago. Patient also reports that she went to an UC on 10/17/21.  Patient states she was contacted by the UC and was told that her kidney stone was a stag horn stone. Patient also c/o N/V and hematuria.

## 2021-10-22 NOTE — ED Provider Triage Note (Signed)
Emergency Medicine Provider Triage Evaluation Note  [name] , a 56 y.o. female  was evaluated in triage.  Pt complains of left flank pain that has been going on for 2 weeks.  She was seen in urgent care and ultimately diagnosed with a kidney stone that needed lithotripsy.  The office is unable to see her due to her medical insurance.  She continues with pain and has begun to have dysuria, hematuria and fevers and chills.  Review of Systems  Positive: Left flank pain, dysuria, hematuria, nausea, vomiting, fever, chills Negative: Decreased urination or abdominal pain  Physical Exam  BP (!) 151/97    Pulse 70    Temp 98.7 F (37.1 C) (Oral)    Resp 18    LMP 03/31/2014 (LMP Unknown)    SpO2 94%  Gen:   Awake, no distress   Resp:  Normal effort  MSK:   Moves extremities without difficulty  Other:  Slightly tender abdomen and left upper quadrant and severe pain along left flank. +CVA  Medical Decision Making  Medically screening exam initiated at 4:33 PM.  Appropriate orders placed.  Marleena Shubert was informed that the remainder of the evaluation will be completed by another provider, this initial triage assessment does not replace that evaluation, and the importance of remaining in the ED until their evaluation is complete.    Concern for infected left calculus.  Labs ordered as well as CT renal.   Rhae Hammock, PA-C 10/22/21 1637

## 2021-10-23 ENCOUNTER — Encounter: Payer: Self-pay | Admitting: Oncology

## 2021-10-23 DIAGNOSIS — E785 Hyperlipidemia, unspecified: Secondary | ICD-10-CM | POA: Diagnosis present

## 2021-10-23 DIAGNOSIS — E890 Postprocedural hypothyroidism: Secondary | ICD-10-CM | POA: Diagnosis present

## 2021-10-23 DIAGNOSIS — Z20822 Contact with and (suspected) exposure to covid-19: Secondary | ICD-10-CM | POA: Diagnosis present

## 2021-10-23 DIAGNOSIS — Z85850 Personal history of malignant neoplasm of thyroid: Secondary | ICD-10-CM | POA: Diagnosis not present

## 2021-10-23 DIAGNOSIS — B964 Proteus (mirabilis) (morganii) as the cause of diseases classified elsewhere: Secondary | ICD-10-CM | POA: Diagnosis present

## 2021-10-23 DIAGNOSIS — E1122 Type 2 diabetes mellitus with diabetic chronic kidney disease: Secondary | ICD-10-CM | POA: Diagnosis present

## 2021-10-23 DIAGNOSIS — Z170 Estrogen receptor positive status [ER+]: Secondary | ICD-10-CM | POA: Diagnosis not present

## 2021-10-23 DIAGNOSIS — E876 Hypokalemia: Secondary | ICD-10-CM | POA: Diagnosis present

## 2021-10-23 DIAGNOSIS — I160 Hypertensive urgency: Secondary | ICD-10-CM | POA: Diagnosis present

## 2021-10-23 DIAGNOSIS — N179 Acute kidney failure, unspecified: Secondary | ICD-10-CM | POA: Diagnosis present

## 2021-10-23 DIAGNOSIS — Z8701 Personal history of pneumonia (recurrent): Secondary | ICD-10-CM | POA: Diagnosis not present

## 2021-10-23 DIAGNOSIS — K859 Acute pancreatitis without necrosis or infection, unspecified: Secondary | ICD-10-CM

## 2021-10-23 DIAGNOSIS — Z79890 Hormone replacement therapy (postmenopausal): Secondary | ICD-10-CM | POA: Diagnosis not present

## 2021-10-23 DIAGNOSIS — Z853 Personal history of malignant neoplasm of breast: Secondary | ICD-10-CM | POA: Diagnosis not present

## 2021-10-23 DIAGNOSIS — I129 Hypertensive chronic kidney disease with stage 1 through stage 4 chronic kidney disease, or unspecified chronic kidney disease: Secondary | ICD-10-CM | POA: Diagnosis present

## 2021-10-23 DIAGNOSIS — Z21 Asymptomatic human immunodeficiency virus [HIV] infection status: Secondary | ICD-10-CM | POA: Diagnosis present

## 2021-10-23 DIAGNOSIS — N184 Chronic kidney disease, stage 4 (severe): Secondary | ICD-10-CM | POA: Diagnosis present

## 2021-10-23 DIAGNOSIS — Z6837 Body mass index (BMI) 37.0-37.9, adult: Secondary | ICD-10-CM | POA: Diagnosis not present

## 2021-10-23 DIAGNOSIS — N200 Calculus of kidney: Secondary | ICD-10-CM | POA: Diagnosis present

## 2021-10-23 DIAGNOSIS — K219 Gastro-esophageal reflux disease without esophagitis: Secondary | ICD-10-CM | POA: Diagnosis present

## 2021-10-23 DIAGNOSIS — Z87891 Personal history of nicotine dependence: Secondary | ICD-10-CM | POA: Diagnosis not present

## 2021-10-23 DIAGNOSIS — I10 Essential (primary) hypertension: Secondary | ICD-10-CM | POA: Diagnosis not present

## 2021-10-23 DIAGNOSIS — N390 Urinary tract infection, site not specified: Secondary | ICD-10-CM | POA: Diagnosis present

## 2021-10-23 DIAGNOSIS — B20 Human immunodeficiency virus [HIV] disease: Secondary | ICD-10-CM | POA: Diagnosis not present

## 2021-10-23 DIAGNOSIS — D631 Anemia in chronic kidney disease: Secondary | ICD-10-CM | POA: Diagnosis present

## 2021-10-23 DIAGNOSIS — E669 Obesity, unspecified: Secondary | ICD-10-CM | POA: Diagnosis present

## 2021-10-23 HISTORY — DX: Acute pancreatitis without necrosis or infection, unspecified: K85.90

## 2021-10-23 LAB — MAGNESIUM: Magnesium: 2 mg/dL (ref 1.7–2.4)

## 2021-10-23 LAB — COMPREHENSIVE METABOLIC PANEL
ALT: 14 U/L (ref 0–44)
AST: 21 U/L (ref 15–41)
Albumin: 3 g/dL — ABNORMAL LOW (ref 3.5–5.0)
Alkaline Phosphatase: 72 U/L (ref 38–126)
Anion gap: 9 (ref 5–15)
BUN: 29 mg/dL — ABNORMAL HIGH (ref 6–20)
CO2: 28 mmol/L (ref 22–32)
Calcium: 8.3 mg/dL — ABNORMAL LOW (ref 8.9–10.3)
Chloride: 100 mmol/L (ref 98–111)
Creatinine, Ser: 2.64 mg/dL — ABNORMAL HIGH (ref 0.44–1.00)
GFR, Estimated: 21 mL/min — ABNORMAL LOW (ref >60)
Glucose, Bld: 209 mg/dL — ABNORMAL HIGH (ref 70–99)
Potassium: 3.2 mmol/L — ABNORMAL LOW (ref 3.5–5.1)
Sodium: 137 mmol/L (ref 135–145)
Total Bilirubin: 0.4 mg/dL (ref 0.3–1.2)
Total Protein: 7.1 g/dL (ref 6.5–8.1)

## 2021-10-23 LAB — CBC WITH DIFFERENTIAL/PLATELET
Abs Immature Granulocytes: 0.08 10*3/uL — ABNORMAL HIGH (ref 0.00–0.07)
Basophils Absolute: 0 10*3/uL (ref 0.0–0.1)
Basophils Relative: 0 %
Eosinophils Absolute: 0.2 10*3/uL (ref 0.0–0.5)
Eosinophils Relative: 2 %
HCT: 32.1 % — ABNORMAL LOW (ref 36.0–46.0)
Hemoglobin: 10.8 g/dL — ABNORMAL LOW (ref 12.0–15.0)
Immature Granulocytes: 1 %
Lymphocytes Relative: 32 %
Lymphs Abs: 2.4 10*3/uL (ref 0.7–4.0)
MCH: 32 pg (ref 26.0–34.0)
MCHC: 33.6 g/dL (ref 30.0–36.0)
MCV: 95.3 fL (ref 80.0–100.0)
Monocytes Absolute: 0.7 10*3/uL (ref 0.1–1.0)
Monocytes Relative: 9 %
Neutro Abs: 4.3 10*3/uL (ref 1.7–7.7)
Neutrophils Relative %: 56 %
Platelets: 221 10*3/uL (ref 150–400)
RBC: 3.37 MIL/uL — ABNORMAL LOW (ref 3.87–5.11)
RDW: 16.6 % — ABNORMAL HIGH (ref 11.5–15.5)
WBC: 7.7 10*3/uL (ref 4.0–10.5)
nRBC: 0 % (ref 0.0–0.2)

## 2021-10-23 LAB — URINALYSIS, ROUTINE W REFLEX MICROSCOPIC
Bilirubin Urine: NEGATIVE
Glucose, UA: 50 mg/dL — AB
Ketones, ur: 5 mg/dL — AB
Nitrite: NEGATIVE
Protein, ur: >=300 mg/dL — AB
RBC / HPF: >50 RBC/hpf — ABNORMAL HIGH (ref 0–5)
Specific Gravity, Urine: 1.018 (ref 1.005–1.030)
WBC, UA: >50 WBC/hpf — ABNORMAL HIGH (ref 0–5)
pH: 5 (ref 5.0–8.0)

## 2021-10-23 LAB — CBG MONITORING, ED
Glucose-Capillary: 190 mg/dL — ABNORMAL HIGH (ref 70–99)
Glucose-Capillary: 201 mg/dL — ABNORMAL HIGH (ref 70–99)

## 2021-10-23 LAB — HEPATIC FUNCTION PANEL
ALT: 17 U/L (ref 0–44)
AST: 22 U/L (ref 15–41)
Albumin: 3.6 g/dL (ref 3.5–5.0)
Alkaline Phosphatase: 82 U/L (ref 38–126)
Bilirubin, Direct: <0.1 mg/dL (ref 0.0–0.2)
Total Bilirubin: 0.4 mg/dL (ref 0.3–1.2)
Total Protein: 8.3 g/dL — ABNORMAL HIGH (ref 6.5–8.1)

## 2021-10-23 LAB — RESP PANEL BY RT-PCR (FLU A&B, COVID) ARPGX2
Influenza A by PCR: NEGATIVE
Influenza B by PCR: NEGATIVE
SARS Coronavirus 2 by RT PCR: NEGATIVE

## 2021-10-23 LAB — LIPASE, BLOOD: Lipase: 192 U/L — ABNORMAL HIGH (ref 11–51)

## 2021-10-23 LAB — GLUCOSE, CAPILLARY: Glucose-Capillary: 173 mg/dL — ABNORMAL HIGH (ref 70–99)

## 2021-10-23 MED ORDER — SODIUM CHLORIDE 0.9 % IV SOLN
1.0000 g | Freq: Once | INTRAVENOUS | Status: AC
  Administered 2021-10-23: 1 g via INTRAVENOUS
  Filled 2021-10-23: qty 10

## 2021-10-23 MED ORDER — ACETAMINOPHEN 650 MG RE SUPP: 650.0000 mg | Freq: Four times a day (QID) | RECTAL | Status: DC | PRN

## 2021-10-23 MED ORDER — OXYCODONE HCL 5 MG PO TABS
10.0000 mg | ORAL_TABLET | Freq: Four times a day (QID) | ORAL | Status: DC | PRN
  Administered 2021-10-25: 10 mg via ORAL
  Filled 2021-10-23: qty 2

## 2021-10-23 MED ORDER — ONDANSETRON HCL 4 MG/2ML IJ SOLN
4.0000 mg | Freq: Once | INTRAMUSCULAR | Status: AC
  Administered 2021-10-23: 4 mg via INTRAVENOUS
  Filled 2021-10-23: qty 2

## 2021-10-23 MED ORDER — ALBUTEROL SULFATE HFA 108 (90 BASE) MCG/ACT IN AERS: 2.0000 | INHALATION_SPRAY | Freq: Four times a day (QID) | RESPIRATORY_TRACT | Status: DC | PRN

## 2021-10-23 MED ORDER — ROSUVASTATIN CALCIUM 10 MG PO TABS
10.0000 mg | ORAL_TABLET | Freq: Every day | ORAL | Status: DC
  Administered 2021-10-23 – 2021-10-25 (×3): 10 mg via ORAL
  Filled 2021-10-23 (×3): qty 1

## 2021-10-23 MED ORDER — EMPAGLIFLOZIN 10 MG PO TABS: 10.0000 mg | ORAL_TABLET | Freq: Every day | ORAL | Status: DC

## 2021-10-23 MED ORDER — FAMOTIDINE 20 MG PO TABS
10.0000 mg | ORAL_TABLET | Freq: Every day | ORAL | Status: DC
  Administered 2021-10-23 – 2021-10-24 (×2): 10 mg via ORAL
  Filled 2021-10-23 (×2): qty 1

## 2021-10-23 MED ORDER — SODIUM CHLORIDE 0.9 % IV SOLN
2.0000 g | INTRAVENOUS | Status: DC
  Administered 2021-10-24 – 2021-10-25 (×2): 2 g via INTRAVENOUS
  Filled 2021-10-23 (×2): qty 20

## 2021-10-23 MED ORDER — MORPHINE SULFATE (PF) 4 MG/ML IV SOLN: 4.0000 mg | INTRAVENOUS | Status: DC | PRN

## 2021-10-23 MED ORDER — ONDANSETRON HCL 4 MG/2ML IJ SOLN: 4.0000 mg | Freq: Four times a day (QID) | INTRAMUSCULAR | Status: DC | PRN

## 2021-10-23 MED ORDER — AMLODIPINE BESYLATE 5 MG PO TABS
5.0000 mg | ORAL_TABLET | Freq: Every day | ORAL | Status: DC
  Administered 2021-10-23 – 2021-10-25 (×3): 5 mg via ORAL
  Filled 2021-10-23 (×3): qty 1

## 2021-10-23 MED ORDER — HEPARIN SODIUM (PORCINE) 5000 UNIT/ML IJ SOLN
5000.0000 [IU] | Freq: Three times a day (TID) | INTRAMUSCULAR | Status: DC
  Administered 2021-10-23 – 2021-10-25 (×5): 5000 [IU] via SUBCUTANEOUS
  Filled 2021-10-23 (×6): qty 1

## 2021-10-23 MED ORDER — INSULIN ASPART 100 UNIT/ML IJ SOLN
0.0000 [IU] | Freq: Every day | INTRAMUSCULAR | Status: DC
  Administered 2021-10-24: 2 [IU] via SUBCUTANEOUS
  Filled 2021-10-23: qty 0.05

## 2021-10-23 MED ORDER — LEVOTHYROXINE SODIUM 112 MCG PO TABS
112.0000 ug | ORAL_TABLET | Freq: Every day | ORAL | Status: DC
  Administered 2021-10-23 – 2021-10-25 (×3): 112 ug via ORAL
  Filled 2021-10-23 (×3): qty 1

## 2021-10-23 MED ORDER — SODIUM CHLORIDE 0.9 % IV BOLUS
1000.0000 mL | Freq: Once | INTRAVENOUS | Status: AC
  Administered 2021-10-23: 1000 mL via INTRAVENOUS

## 2021-10-23 MED ORDER — PANTOPRAZOLE SODIUM 40 MG PO TBEC
40.0000 mg | DELAYED_RELEASE_TABLET | Freq: Every day | ORAL | Status: DC
  Administered 2021-10-23 – 2021-10-25 (×3): 40 mg via ORAL
  Filled 2021-10-23 (×4): qty 1

## 2021-10-23 MED ORDER — BUDESONIDE 0.25 MG/2ML IN SUSP
0.2500 mg | Freq: Two times a day (BID) | RESPIRATORY_TRACT | Status: DC
  Administered 2021-10-23 – 2021-10-25 (×4): 0.25 mg via RESPIRATORY_TRACT
  Filled 2021-10-23 (×4): qty 2

## 2021-10-23 MED ORDER — FLUTICASONE PROPIONATE HFA 110 MCG/ACT IN AERO: 1.0000 | INHALATION_SPRAY | Freq: Two times a day (BID) | RESPIRATORY_TRACT | Status: DC | PRN

## 2021-10-23 MED ORDER — ALBUTEROL SULFATE (2.5 MG/3ML) 0.083% IN NEBU: 2.5000 mg | INHALATION_SOLUTION | Freq: Four times a day (QID) | RESPIRATORY_TRACT | Status: DC | PRN

## 2021-10-23 MED ORDER — FAMOTIDINE 20 MG PO TABS
20.0000 mg | ORAL_TABLET | Freq: Every day | ORAL | Status: DC
  Administered 2021-10-23: 20 mg via ORAL
  Filled 2021-10-23: qty 1

## 2021-10-23 MED ORDER — DOLUTEGRAVIR SODIUM 50 MG PO TABS
50.0000 mg | ORAL_TABLET | Freq: Every day | ORAL | Status: DC
  Administered 2021-10-23 – 2021-10-25 (×3): 50 mg via ORAL
  Filled 2021-10-23 (×3): qty 1

## 2021-10-23 MED ORDER — MORPHINE SULFATE (PF) 4 MG/ML IV SOLN
4.0000 mg | Freq: Once | INTRAVENOUS | Status: AC
  Administered 2021-10-23: 4 mg via INTRAVENOUS
  Filled 2021-10-23: qty 1

## 2021-10-23 MED ORDER — GABAPENTIN 300 MG PO CAPS
300.0000 mg | ORAL_CAPSULE | Freq: Every day | ORAL | Status: DC
  Administered 2021-10-23 – 2021-10-25 (×3): 300 mg via ORAL
  Filled 2021-10-23 (×3): qty 1

## 2021-10-23 MED ORDER — POTASSIUM CHLORIDE CRYS ER 20 MEQ PO TBCR
40.0000 meq | EXTENDED_RELEASE_TABLET | Freq: Every day | ORAL | Status: DC
  Administered 2021-10-23 – 2021-10-24 (×2): 40 meq via ORAL
  Filled 2021-10-23 (×2): qty 2

## 2021-10-23 MED ORDER — ONDANSETRON HCL 4 MG PO TABS: 4.0000 mg | ORAL_TABLET | Freq: Four times a day (QID) | ORAL | Status: DC | PRN

## 2021-10-23 MED ORDER — ACETAMINOPHEN 325 MG PO TABS
650.0000 mg | ORAL_TABLET | Freq: Four times a day (QID) | ORAL | Status: DC | PRN
  Administered 2021-10-23 – 2021-10-24 (×3): 650 mg via ORAL
  Filled 2021-10-23 (×3): qty 2

## 2021-10-23 MED ORDER — HYDRALAZINE HCL 50 MG PO TABS
50.0000 mg | ORAL_TABLET | Freq: Three times a day (TID) | ORAL | Status: DC
  Administered 2021-10-23 – 2021-10-25 (×6): 50 mg via ORAL
  Filled 2021-10-23: qty 2
  Filled 2021-10-23 (×5): qty 1

## 2021-10-23 MED ORDER — EMTRICITABINE-TENOFOVIR AF 200-25 MG PO TABS
1.0000 | ORAL_TABLET | Freq: Every day | ORAL | Status: DC
  Administered 2021-10-23 – 2021-10-25 (×3): 1 via ORAL
  Filled 2021-10-23 (×4): qty 1

## 2021-10-23 MED ORDER — HYDRALAZINE HCL 20 MG/ML IJ SOLN
10.0000 mg | Freq: Three times a day (TID) | INTRAMUSCULAR | Status: DC | PRN
  Administered 2021-10-23: 10 mg via INTRAVENOUS
  Filled 2021-10-23: qty 1

## 2021-10-23 MED ORDER — INSULIN ASPART 100 UNIT/ML IJ SOLN
0.0000 [IU] | Freq: Three times a day (TID) | INTRAMUSCULAR | Status: DC
  Administered 2021-10-23: 3 [IU] via SUBCUTANEOUS
  Administered 2021-10-23: 5 [IU] via SUBCUTANEOUS
  Administered 2021-10-24: 2 [IU] via SUBCUTANEOUS
  Administered 2021-10-24 (×2): 5 [IU] via SUBCUTANEOUS
  Administered 2021-10-25: 3 [IU] via SUBCUTANEOUS
  Filled 2021-10-23: qty 0.15

## 2021-10-23 MED ORDER — TAMSULOSIN HCL 0.4 MG PO CAPS
0.4000 mg | ORAL_CAPSULE | Freq: Every day | ORAL | Status: DC
  Administered 2021-10-23 – 2021-10-25 (×3): 0.4 mg via ORAL
  Filled 2021-10-23 (×3): qty 1

## 2021-10-23 MED ORDER — FLUTICASONE PROPIONATE 50 MCG/ACT NA SUSP
2.0000 | Freq: Every day | NASAL | Status: DC
  Administered 2021-10-24: 2 via NASAL
  Filled 2021-10-23: qty 16

## 2021-10-23 MED ORDER — EMTRICITABINE-TENOFOVIR AF 200-25 MG PO TABS: 1.0000 | ORAL_TABLET | Freq: Every day | ORAL | Status: DC

## 2021-10-23 MED ORDER — DOLUTEGRAVIR SODIUM 50 MG PO TABS: 50.0000 mg | ORAL_TABLET | Freq: Every day | ORAL | Status: DC

## 2021-10-23 MED ORDER — SODIUM CHLORIDE 0.9 % IV SOLN: INTRAVENOUS | Status: DC

## 2021-10-23 NOTE — Progress Notes (Signed)
Pharmacy Brief Note:  Empagliflozin discontinued per protocol as patient is being treated for UTI.    Lenis Noon, PharmD 10/23/21 12:19 PM

## 2021-10-23 NOTE — H&P (Signed)
History and Physical    [name] ZHG:[id number] DOB: [birth year] DOA: 10/22/2021  PCP: [name], NP  Patient coming from: home  Chief Complaint: abdominal pain  HPI: [name] is a 56 y.o. female with medical history significant of HTN, HIV, HLD, DM2, GERD, breast CA. Presenting with abdominal pain. She started having left abdominal pain and flank pain about 10 days ago. She thought it was gas pain, so she tried some OTC meds. This didn't help. She eventually went to urgent care and was told that she had a kidney stone. She was given macrobid, baclofen and tamsulosin. These did not help. She started to develop N/V after initiating those medication. Her symptoms worsened through last night, so she decided to come to the did for assistance. She denies any other aggravating or alleviating factors.    ED Course: CT was notable for pancreatitis. Her UA was dirty. She was started on rocephin, fluids and pain control. TRH was called for admission.   Review of Systems:  Denies CP, palpitations, dyspnea, diarrhea, fever, sick contacts. Review of systems is otherwise negative for all not mentioned in HPI.   PMHx Past Medical History:  Diagnosis Date   Allergy    Anemia    Normocytic   Anxiety    Asthma    Blood dyscrasia    Bronchitis 2005   CKD (chronic kidney disease)    CLASS 1-EXOPHTHALMOS-THYROTOXIC 02/08/2007   Diabetes mellitus without complication ([place])    Family history of breast cancer    Family history of lung cancer    Family history of prostate cancer    Gastroenteritis 07/10/07   GERD 07/24/2006   GRAVE'S DISEASE 01/01/2008   History of hidradenitis suppurativa    History of kidney stones    History of thrush    HIV DISEASE 07/24/2006   dx March 05   Hyperlipidemia    HYPERTENSION 07/24/2006   Hyperthyroidism 08/2006   Grave's Disease -diffuse radiotracer uptake 08/25/06 Thyroid scan-Cold nodule to R lower lobe of thyrorid   Menometrorrhagia     hx of   Nephrolithiasis    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 Completed 2 yrs Prednisone after lung bx confirmation. No symptoms since then.   Suppurative hidradenitis    Thyroid cancer ([place])    THYROID NODULE, RIGHT 02/08/2007    PSHx Past Surgical History:  Procedure Laterality Date   APPLICATION OF WOUND VAC N/A 01/20/2021   Procedure: APPLICATION OF WOUND VAC;  Surgeon: Wallace Going, DO;  Location: [place];  Service: Plastics;  Laterality: N/A;   BREAST EXCISIONAL BIOPSY Right 04/26/2018   right axilla negative   BREAST EXCISIONAL BIOPSY Left 04/26/2018   left axilla negative   BREAST LUMPECTOMY Right 10/03/2018   malignant   BREAST LUMPECTOMY WITH RADIOACTIVE SEED AND SENTINEL LYMPH NODE BIOPSY Right 10/03/2018   Procedure: RIGHT BREAST LUMPECTOMY WITH RADIOACTIVE SEED AND SENTINEL LYMPH NODE MAPPING;  Surgeon: Erroll [facility], MD;  Location: [place];  Service: General;  Laterality: Right;   BREAST SURGERY  1997   Breast Reduction  CYSTOSCOPY W/ URETERAL STENT REMOVAL  11/09/2012   Procedure: CYSTOSCOPY WITH STENT REMOVAL;  Surgeon: Alexis Frock, MD;  Location: [facility];  Service: Urology;  Laterality: Right;   CYSTOSCOPY WITH RETROGRADE PYELOGRAM, URETEROSCOPY AND STENT PLACEMENT  11/09/2012   Procedure: CYSTOSCOPY WITH RETROGRADE PYELOGRAM, URETEROSCOPY AND STENT PLACEMENT;  Surgeon: Alexis Frock, MD;  Location: [facility];  Service: Urology;  Laterality: Left;  LEFT URETEROSCOPY, STONE MANIPULATION, left STENT exchange    CYSTOSCOPY WITH STENT  PLACEMENT  10/02/2012   Procedure: CYSTOSCOPY WITH STENT PLACEMENT;  Surgeon: Alexis Frock, MD;  Location: [facility];  Service: Urology;  Laterality: Left;   DEBRIDEMENT AND CLOSURE WOUND N/A 01/20/2021   Procedure: Excision of abdominal wound with closure;  Surgeon: Wallace Going, DO;  Location: [place];  Service: Plastics;  Laterality: N/A;   DILATION AND CURETTAGE OF UTERUS  Feb 2004   s/p for 1st trimester nonviable pregnancy   EYE SURGERY     sty under eyelid   INCISE AND DRAIN ABCESS  Nov 03   s/p I &D for righ inframmary fold hidradenitis   INCISION AND DRAINAGE PERITONSILLAR ABSCESS  Mar 03   IR CV LINE INJECTION  06/07/2018   IR IMAGING GUIDED PORT INSERTION  06/20/2018   IR REMOVAL TUN ACCESS W/ PORT W/O FL MOD SED  06/20/2018   IRRIGATION AND DEBRIDEMENT ABSCESS  01/31/2012   Procedure: IRRIGATION AND DEBRIDEMENT ABSCESS;  Surgeon: Shann Medal, MD;  [facility];  Service: General;  Laterality: Right;  right breast and axilla    NEPHROLITHOTOMY  10/02/2012   Procedure: NEPHROLITHOTOMY PERCUTANEOUS;  Surgeon: Alexis Frock, MD;  [facility];  Service: Urology;  Laterality: Right;  First Stage Percutaneous Nephrolithotomy with Surgeon Access, Left Ureteral Stent     NEPHROLITHOTOMY  10/04/2012   Procedure: NEPHROLITHOTOMY PERCUTANEOUS SECOND LOOK;  Surgeon: Alexis Frock, MD;  [facility];  Service: Urology;  Laterality: Right;      NEPHROLITHOTOMY  10/08/2012   Procedure: NEPHROLITHOTOMY PERCUTANEOUS;  Surgeon: Alexis Frock, MD;  [facility];  Service: Urology;  Laterality: Right;  THIRD STAGE, nephrostomy tube exchange x 2   NEPHROLITHOTOMY  10/11/2012   Procedure: NEPHROLITHOTOMY PERCUTANEOUS SECOND LOOK;  Surgeon: Alexis Frock, MD;  [facility];  Service: Urology;  Laterality: Right;  RIGHT 4 STAGE PERCUTANOUS NEPHROLITHOTOMY, right URETEROSCOPY WITH HOLMIUM LASER    PANNICULECTOMY N/A 12/21/2020   Procedure: PANNICULECTOMY;   Surgeon: Wallace Going, DO;  Location: [place];  Service: Plastics;  Laterality: N/A;   PORT-A-CATH REMOVAL N/A 07/16/2020   Procedure: REMOVAL PORT-A-CATH;  Surgeon: Erroll [facility], MD;  Location: [place];  Service: General;  Laterality: N/A;   PORTACATH PLACEMENT Left 05/17/2018   Procedure: INSERTION PORT-A-CATH;  Surgeon: Coralie Keens, MD;  Location: [place];  Service: General;  Laterality: Left;   RADICAL NECK DISSECTION  04/12/2017   limited/notes 04/12/2017   RADICAL NECK DISSECTION N/A 04/12/2017   Procedure: RADICAL NECK DISSECTION;  Surgeon: Melida Quitter, MD;  Location: [place];  Service: ENT;  Laterality: N/A;  limited neck dissection 2 hours total   REDUCTION MAMMAPLASTY Bilateral 1998   RIGHT/LEFT HEART CATH AND CORONARY ANGIOGRAPHY N/A 03/12/2020   Procedure: RIGHT/LEFT HEART CATH AND CORONARY ANGIOGRAPHY;  Surgeon: Jolaine Artist, MD;  Location: [place] [facility];  Service: Cardiovascular;  Laterality: N/A;   [place]  04/12/2017   completion/notes 04/12/2017   THYROIDECTOMY N/A 04/12/2017  Procedure: THYROIDECTOMY;  Surgeon: Melida Quitter, MD;  Location: [facility];  Service: ENT;  Laterality: N/A;  Completion Thyroidectomy   TOTAL THYROIDECTOMY  2010    SocHx  reports that she quit smoking about 4 years ago. Her smoking use included cigarettes. She started smoking about 4 years ago. She has a 7.50 pack-year smoking history. She has never used smokeless tobacco. She reports current alcohol use. She reports that she does not use drugs.  Allergies  Allergen Reactions   Genvoya [Elviteg-Cobic-Emtricit-Tenofaf] Hives   Lisinopril Cough   Tegaderm Ag Mesh [Silver] Itching    FamHx Family History  Problem Relation Age of Onset   Hypertension Mother    Cancer Mother        laryngeal   Heart disease Mother        stent   Hypertension Father    Lung cancer Father 26       hx smoking   Heart disease Other     Hypertension Other    Stroke Other        Grandparent   Kidney disease Other        Grandparent   Diabetes Other        FH of Diabetes   Hypertension Sister    Cancer Maternal Uncle        Lung CA   Hypertension Brother    Hypertension Sister    Breast cancer Maternal Aunt 65   Breast cancer Paternal Aunt 1   Prostate cancer Paternal Uncle    Breast cancer Maternal Aunt        dx 62+   Breast cancer Paternal Aunt        dx 21's   Breast cancer Paternal Aunt        dx 50's   Prostate cancer Paternal Uncle    Lung cancer Paternal Uncle    Breast cancer Cousin 24   Breast cancer Cousin        dx <50   Breast cancer Cousin        dx <50   Breast cancer Cousin        dx <50   Colon cancer Neg Hx    Esophageal cancer Neg Hx    Rectal cancer Neg Hx    Stomach cancer Neg Hx     Prior to Admission medications   Medication Sig Start Date End Date Taking? Authorizing Provider  acetaminophen (TYLENOL) 500 MG tablet Take 1,000 mg by mouth every 6 (six) hours as needed for mild pain.   Yes [provider]  albuterol (VENTOLIN HFA) 108 (90 Base) MCG/ACT inhaler Inhale 2 puffs into the lungs every 6 (six) hours as needed for wheezing or shortness of breath. 10/07/20  Yes Hall-Potvin, [place], PA-C  allopurinol (ZYLOPRIM) 100 MG tablet TAKE 1 TABLET BY MOUTH DAILY FOR GOUT PREVENTION Patient taking differently: Take 100 mg by mouth daily. 09/11/21  Yes [name], NP  amLODipine (NORVASC) 5 MG tablet Take 1 tablet (5 mg total) by mouth daily. 09/15/21  Yes Skeet Latch, MD  baclofen (LIORESAL) 10 MG tablet Take 10 mg by mouth 3 (three) times daily as needed for muscle spasms. 10/17/21  Yes [provider]  candesartan (ATACAND) 32 MG tablet Take 1 tablet (32 mg total) by mouth daily. 12/28/20  Yes Skeet Latch, MD  carvedilol (COREG) 25 MG tablet TAKE 1 TABLET BY MOUTH TWICE A DAY Patient taking differently: Take 25 mg by mouth in the morning and at  bedtime. 08/23/21  Yes Skeet Latch, MD  cetirizine (ZYRTEC) 10 MG tablet Take 1 tablet by mouth at bedtime for allergies. Patient taking differently: Take 10 mg by mouth at bedtime as needed for allergies. 07/19/21  Yes Michela Pitcher, NP  dolutegravir ([name]) 50 MG tablet Take 1 tablet (50 mg total) by mouth daily. 04/01/21  Yes Michel Bickers, MD  empagliflozin (JARDIANCE) 10 MG TABS tablet Take 1 tablet (10 mg total) by mouth daily before breakfast. 07/08/20  Yes Bensimhon, Shaune Pascal, MD  emtricitabine-tenofovir AF (DESCOVY) 200-25 MG tablet Take 1 tablet by mouth daily. 03/31/21  Yes Michel Bickers, MD  famotidine (PEPCID) 20 MG tablet TAKE 1 TO 2 TABLETS BY MOUTH AT BEDTIME FOR HOARSE VOICE/REFLUX Patient taking differently: Take 20 mg by mouth daily. 09/11/21  Yes [name], NP  FLOVENT HFA 110 MCG/ACT inhaler TAKE 1 PUFF BY MOUTH TWICE A DAY Patient taking differently: Inhale 1 puff into the lungs 2 (two) times daily as needed (wheezing/shortness of breath). 08/27/19  Yes [name], NP  fluticasone (FLONASE) 50 MCG/ACT nasal spray Place 2 sprays into both nostrils daily. 07/19/21  Yes Michela Pitcher, NP  gabapentin (NEURONTIN) 300 MG capsule Take 300 mg by mouth daily.   Yes [provider]  glipiZIDE (GLUCOTROL) 5 MG tablet TAKE 1 TABLET (5 MG TOTAL) BY MOUTH 2 (TWO) TIMES DAILY BEFORE A MEAL. FOR DIABETES. Patient taking differently: Take 5 mg by mouth in the morning and at bedtime. 09/11/21  Yes [name], NP  levothyroxine (SYNTHROID) 112 MCG tablet TAKE 1 TABLET BY MOUTH EVERY DAY BEFORE BREAKFAST Patient taking differently: Take 112 mcg by mouth daily. 10/07/21  Yes Renato Shin, MD  omeprazole (PRILOSEC) 40 MG capsule TAKE 1 CAPSULE BY MOUTH DAILY FOR REFLUX Patient taking differently: Take 40 mg by mouth daily. 07/22/21  Yes [name], NP  rosuvastatin (CRESTOR) 10 MG tablet TAKE 1 TABLET BY MOUTH EVERY DAY FOR CHOLESTEROL Patient taking  differently: Take 10 mg by mouth daily. 11/16/20  Yes [name], NP  sitaGLIPtin (JANUVIA) 50 MG tablet Take 1 tablet (50 mg total) by mouth daily. For diabetes. 06/09/21  Yes [name], NP  spironolactone (ALDACTONE) 25 MG tablet Take 1 tablet (25 mg total) by mouth daily. 09/18/20  Yes Bensimhon, Shaune Pascal, MD  tamoxifen (NOLVADEX) 20 MG tablet Take 1 tablet (20 mg total) by mouth daily. 07/27/21  Yes Magrinat, Virgie Dad, MD  tamsulosin (FLOMAX) 0.4 MG CAPS capsule Take 0.4 mg by mouth daily. 10/17/21  Yes [provider]  Blood Glucose Monitoring Suppl (FREESTYLE LITE) w/Device KIT Inject 1 each into the skin daily. 11/10/20   Skeet Latch, MD  glucose blood [facility] VERIO) test strip USE AS INSTRUCTED TO TEST BLOOD SUGAR DAILY 11/25/20   [name], NP  Lancets [facility] ULTRASOFT) lancets Use as instructed to test blood sugar daily 11/25/20   [name], NP  sertraline (ZOLOFT) 50 MG tablet TAKE 1 TABLET (50 MG TOTAL) BY MOUTH DAILY. FOR ANXIETY AND DEPRESSION. Patient not taking: Reported on 10/23/2021 09/11/21   [name], NP  SYNTHROID 150 MCG tablet Take 1 tablet (150 mcg total) by mouth daily before breakfast. Patient not taking: Reported on 10/23/2021 07/01/21   Renato Shin, MD  prochlorperazine (COMPAZINE) 10 MG tablet TAKE 1 TABLET BY MOUTH EVERY 6 HOURS AS NEEDED FOR NAUSEA OR VOMITING 08/24/18 12/27/18  Magrinat, Virgie Dad, MD    Physical Exam: Vitals:  10/23/21 0325 10/23/21 0540 10/23/21 0713 10/23/21 0820  BP: (!) 188/95 (!) 192/107 (!) 155/102 (!) 207/112  Pulse: 77 (!) 51 (!) 58 (!) 54  Resp: _0 Temp:  98.1 F (36.7 C)    TempSrc:  Oral    SpO2: 98% 98% 96% 95%  Weight:  101.6 kg    Height:  _1  (1.651 m)      General: 56 y.o. female resting in bed in NAD Eyes: PERRL, normal sclera ENMT: Nares patent w/o discharge, orophaynx clear, dentition normal, ears w/o discharge/lesions/ulcers Neck: Supple, trachea  midline Cardiovascular: RRR, +S1, S2, no m/g/r, equal pulses throughout Respiratory: CTABL, no w/r/r, normal WOB GI: BS+, obese, soft, TTP LUQ/LLQ, no masses noted, no organomegaly noted MSK: No e/c/c Neuro: A&O x 3, no focal deficits Psyc: Appropriate interaction and affect, calm/cooperative  Labs on Admission: I have personally reviewed following labs and imaging studies  CBC: Recent Labs  Lab 10/22/21 1649  WBC 8.7  HGB 11.7*  HCT 35.1*  MCV 94.1  PLT 401   Basic Metabolic Panel: Recent Labs  Lab 10/22/21 1649  NA 135  K 3.1*  CL 98  CO2 29  GLUCOSE 319*  BUN 27*  CREATININE 3.05*  CALCIUM 8.7*   GFR: Estimated Creatinine Clearance: 25.2 mL/min (A) (by C-G formula based on SCr of 3.05 mg/dL (H)). Liver Function Tests: Recent Labs  Lab 10/23/21 0615  AST 22  ALT 17  ALKPHOS 82  BILITOT 0.4  PROT 8.3*  ALBUMIN 3.6   Recent Labs  Lab 10/23/21 0615  LIPASE 192*   No results for input(s): AMMONIA in the last 168 hours. Coagulation Profile: No results for input(s): INR, PROTIME in the last 168 hours. Cardiac Enzymes: No results for input(s): CKTOTAL, CKMB, CKMBINDEX, TROPONINI in the last 168 hours. BNP (last 3 results) No results for input(s): PROBNP in the last 8760 hours. HbA1C: No results for input(s): HGBA1C in the last 72 hours. CBG: No results for input(s): GLUCAP in the last 168 hours. Lipid Profile: No results for input(s): CHOL, HDL, LDLCALC, TRIG, CHOLHDL, LDLDIRECT in the last 72 hours. Thyroid Function Tests: No results for input(s): TSH, T4TOTAL, FREET4, T3FREE, THYROIDAB in the last 72 hours. Anemia Panel: No results for input(s): VITAMINB12, FOLATE, FERRITIN, TIBC, IRON, RETICCTPCT in the last 72 hours. Urine analysis:    Component Value Date/Time   COLORURINE AMBER (A) 10/22/2021 1636   APPEARANCEUR CLOUDY (A) 10/22/2021 1636   LABSPEC 1.018 10/22/2021 1636   PHURINE 5.0 10/22/2021 1636   GLUCOSEU 50 (A) 10/22/2021 1636    GLUCOSEU NEG mg/dL 01/23/2008 1418   HGBUR LARGE (A) 10/22/2021 1636   BILIRUBINUR NEGATIVE 10/22/2021 1636   BILIRUBINUR negative 04/30/2021 1119   KETONESUR 5 (A) 10/22/2021 1636   PROTEINUR >=300 (A) 10/22/2021 1636   UROBILINOGEN 0.2 04/30/2021 1119   UROBILINOGEN 0.2 10/21/2014 2347   NITRITE NEGATIVE 10/22/2021 1636   LEUKOCYTESUR LARGE (A) 10/22/2021 1636    Radiological Exams on Admission: CT Renal Stone Study  Result Date: 10/22/2021 CLINICAL DATA:  LEFT flank pain for 17 days question kidney stone EXAM: CT ABDOMEN AND PELVIS WITHOUT CONTRAST TECHNIQUE: Multidetector CT imaging of the abdomen and pelvis was performed following the standard protocol without IV contrast. COMPARISON:  06/03/2019 FINDINGS: Lower chest: Lung bases clear Hepatobiliary: Contracted gallbladder questionably containing small calculi. Liver unremarkable. Pancreas: Mild enlargement of the pancreatic tail with peripancreatic infiltration consistent with distal pancreatitis. Proximal tail, body, head, and uncinate process of  pancreas are normal in appearance. No pancreatitis associated focal fluid collection/pseudocyst identified. Spleen: Normal appearance Adrenals/Urinary Tract: BILATERAL adrenal thickening greater on LEFT. No focal adrenal mass. LEFT kidney and LEFT ureter normal appearance. Cortical thinning RIGHT kidney with multiple large calculi again identified measuring up to 31 mm upper pole, 36 mm mid kidney, and 26 mm lower pole. No hydronephrosis. Ureters and bladder unremarkable. Stomach/Bowel: Increased stool in rectum. Scattered stool throughout remainder of colon. Appendix not visualized. Stomach and bowel loops otherwise unremarkable. Vascular/Lymphatic: Atherosclerotic calcifications aorta and iliac arteries without aneurysm. No adenopathy. Reproductive: Unremarkable uterus and adnexa Other: No free air or free fluid. Soft tissue thickening at umbilicus increased from previous exam, question related to  panniculectomy and wound closure in 2022. Musculoskeletal: Unremarkable IMPRESSION: Enlargement of pancreatic tail with peripancreatic infiltration consistent with distal pancreatitis. No pancreatitis associated focal fluid collection/pseudocyst identified. Recommend correlation with serum lipase. Multiple large RIGHT renal calculi without hydronephrosis or hydroureter. Increased stool in rectum. Soft tissue thickening at umbilicus increased from previous exam, question related to panniculectomy and wound closure in 2022; recommend clinical correlation to exclude active cutaneous process/cellulitis. Aortic Atherosclerosis (ICD10-I70.0). Electronically Signed   By: Lavonia Dana M.D.   On: 10/22/2021 16:56    EKG: None obtained in ED  Assessment/Plan Acute pancreatitis     - admit to inpt, progressive     - CT c/w pancreatitis; no abscess/necrosis seen     - fluids, anti-emetics, pain control     - she can have CLD  UTI     - follow UCx, continue rocephin for now  HTN urgency     - resume home regimen except for ARB/diuretic d/t AKI     - add PRN hydralazine  HIV     - continue home regimen  Hx of breast cancer     - continue follow up with onco  Hx of papillary adenocarcinoma of thyroid s/p thyroidectomy     - continue home synthroid  HLD     - continue home statin  DM2     - A1c, SSI, glucose checks     - CLD for now  AKI on CKD4     - CT does not show any obstruction     - fluids     - watch nephrotoxins; holding ARB/diuretic d/t AKI  Hypokalemia     - replace K+, check Mg2+  Anemia of chronic disease     - at baseline, no evidence of bleed, follow  DVT prophylaxis: heparin  Code Status: FULL  Family Communication: None at bedside  Consults called: None   Status is: Inpatient  Remains inpatient appropriate because: severity of illness  Jonnie Finner DO [facility]  If 7PM-7AM, please contact night-coverage [email or web address]  10/23/2021, 9:14 AM

## 2021-10-23 NOTE — ED Provider Notes (Signed)
[facility] DEPT Provider Note   CSN: [id number] Arrival date & time: 10/22/21  1422     History  Chief Complaint  Patient presents with   Flank Pain    [name] is a 56 y.o. female.  The history is provided by the patient and medical records.  Flank Pain [name] is a 56 y.o. female who presents to the Emergency Department complaining of flank pain.   Wednesday before new years developed left flank and abdominal pain.  Pain is constant in nature.  Took maalox and mylanta without improvement.  Went to urgent care new years day and was diagnosed with kidney stones and started on meds - started nitrofurantoin 100mg  bid, baclofen 10 mg, tamsulosin 0.4mg .  the baclofen helped her pain some but made her nauseated.    Has associated nausea.  Vomited twice one week ago.  Had fever to 101 on Saturday.  Has poor appetite and oral intake due to profound nausea.  Has cough/runny nose started Monday.  No dysuria.      Home Medications Prior to Admission medications   Medication Sig Start Date End Date Taking? Authorizing Provider  acetaminophen (TYLENOL) 500 MG tablet Take 1,000 mg by mouth every 6 (six) hours as needed for mild pain.   Yes [provider]  albuterol (VENTOLIN HFA) 108 (90 Base) MCG/ACT inhaler Inhale 2 puffs into the lungs every 6 (six) hours as needed for wheezing or shortness of breath. 10/07/20  Yes Hall-Potvin, [place], PA-C  allopurinol (ZYLOPRIM) 100 MG tablet TAKE 1 TABLET BY MOUTH DAILY FOR GOUT PREVENTION Patient taking differently: Take 100 mg by mouth daily. 09/11/21  Yes [name], NP  amLODipine (NORVASC) 5 MG tablet Take 1 tablet (5 mg total) by mouth daily. 09/15/21  Yes Skeet Latch, MD  baclofen (LIORESAL) 10 MG tablet Take 10 mg by mouth 3 (three) times daily as needed for muscle spasms. 10/17/21  Yes [provider]  candesartan (ATACAND) 32 MG tablet Take 1 tablet (32 mg  total) by mouth daily. 12/28/20  Yes Skeet Latch, MD  carvedilol (COREG) 25 MG tablet TAKE 1 TABLET BY MOUTH TWICE A DAY Patient taking differently: Take 25 mg by mouth in the morning and at bedtime. 08/23/21  Yes Skeet Latch, MD  cetirizine (ZYRTEC) 10 MG tablet Take 1 tablet by mouth at bedtime for allergies. Patient taking differently: Take 10 mg by mouth at bedtime as needed for allergies. 07/19/21  Yes Michela Pitcher, NP  dolutegravir ([name]) 50 MG tablet Take 1 tablet (50 mg total) by mouth daily. 04/01/21  Yes Michel Bickers, MD  empagliflozin (JARDIANCE) 10 MG TABS tablet Take 1 tablet (10 mg total) by mouth daily before breakfast. 07/08/20  Yes Bensimhon, Shaune Pascal, MD  emtricitabine-tenofovir AF (DESCOVY) 200-25 MG tablet Take 1 tablet by mouth daily. 03/31/21  Yes Michel Bickers, MD  famotidine (PEPCID) 20 MG tablet TAKE 1 TO 2 TABLETS BY MOUTH AT BEDTIME FOR HOARSE VOICE/REFLUX Patient taking differently: Take 20 mg by mouth daily. 09/11/21  Yes [name], NP  FLOVENT HFA 110 MCG/ACT inhaler TAKE 1 PUFF BY MOUTH TWICE A DAY Patient taking differently: Inhale 1 puff into the lungs 2 (two) times daily as needed (wheezing/shortness of breath). 08/27/19  Yes [name], NP  fluticasone (FLONASE) 50 MCG/ACT nasal spray Place 2 sprays into both nostrils daily. 07/19/21  Yes Michela Pitcher, NP  gabapentin (NEURONTIN) 300 MG capsule Take 300 mg by  mouth daily.   Yes [provider]  glipiZIDE (GLUCOTROL) 5 MG tablet TAKE 1 TABLET (5 MG TOTAL) BY MOUTH 2 (TWO) TIMES DAILY BEFORE A MEAL. FOR DIABETES. Patient taking differently: Take 5 mg by mouth in the morning and at bedtime. 09/11/21  Yes [name], NP  levothyroxine (SYNTHROID) 112 MCG tablet TAKE 1 TABLET BY MOUTH EVERY DAY BEFORE BREAKFAST Patient taking differently: Take 112 mcg by mouth daily. 10/07/21  Yes Renato Shin, MD  omeprazole (PRILOSEC) 40 MG capsule TAKE 1 CAPSULE BY MOUTH DAILY FOR  REFLUX Patient taking differently: Take 40 mg by mouth daily. 07/22/21  Yes [name], NP  rosuvastatin (CRESTOR) 10 MG tablet TAKE 1 TABLET BY MOUTH EVERY DAY FOR CHOLESTEROL Patient taking differently: Take 10 mg by mouth daily. 11/16/20  Yes [name], NP  sitaGLIPtin (JANUVIA) 50 MG tablet Take 1 tablet (50 mg total) by mouth daily. For diabetes. 06/09/21  Yes [name], NP  spironolactone (ALDACTONE) 25 MG tablet Take 1 tablet (25 mg total) by mouth daily. 09/18/20  Yes Bensimhon, Shaune Pascal, MD  tamoxifen (NOLVADEX) 20 MG tablet Take 1 tablet (20 mg total) by mouth daily. 07/27/21  Yes Magrinat, Virgie Dad, MD  tamsulosin (FLOMAX) 0.4 MG CAPS capsule Take 0.4 mg by mouth daily. 10/17/21  Yes [provider]  Blood Glucose Monitoring Suppl (FREESTYLE LITE) w/Device KIT Inject 1 each into the skin daily. 11/10/20   Skeet Latch, MD  glucose blood [facility] VERIO) test strip USE AS INSTRUCTED TO TEST BLOOD SUGAR DAILY 11/25/20   [name], NP  Lancets [facility] ULTRASOFT) lancets Use as instructed to test blood sugar daily 11/25/20   [name], NP  sertraline (ZOLOFT) 50 MG tablet TAKE 1 TABLET (50 MG TOTAL) BY MOUTH DAILY. FOR ANXIETY AND DEPRESSION. Patient not taking: Reported on 10/23/2021 09/11/21   [name], NP  SYNTHROID 150 MCG tablet Take 1 tablet (150 mcg total) by mouth daily before breakfast. Patient not taking: Reported on 10/23/2021 07/01/21   Renato Shin, MD  prochlorperazine (COMPAZINE) 10 MG tablet TAKE 1 TABLET BY MOUTH EVERY 6 HOURS AS NEEDED FOR NAUSEA OR VOMITING 08/24/18 12/27/18  Magrinat, Virgie Dad, MD      Allergies    Jorje Guild [elviteg-cobic-emtricit-tenofaf], Lisinopril, and Tegaderm ag mesh [silver]    Review of Systems   Review of Systems  Genitourinary:  Positive for flank pain.  All other systems reviewed and are negative.  Physical Exam Updated Vital Signs BP (!) 155/102    Pulse (!) 58    Temp 98.1 F (36.7  C) (Oral)    Resp 20    Ht $R'5\' 5"'od$  (1.651 m)    Wt 101.6 kg    LMP 03/31/2014 (LMP Unknown)    SpO2 96%    BMI 37.28 kg/m  Physical Exam Vitals and nursing note reviewed.  Constitutional:      Appearance: She is well-developed.  HENT:     Head: Normocephalic and atraumatic.  Cardiovascular:     Rate and Rhythm: Normal rate and regular rhythm.     Heart sounds: No murmur heard. Pulmonary:     Effort: Pulmonary effort is normal. No respiratory distress.     Breath sounds: Normal breath sounds.  Abdominal:     Palpations: Abdomen is soft.     Tenderness: There is no guarding or rebound.     Comments: Moderate epigastric and LUQ tenderness  Musculoskeletal:  General: No swelling or tenderness.  Skin:    General: Skin is warm and dry.  Neurological:     Mental Status: She is alert and oriented to person, place, and time.  Psychiatric:        Behavior: Behavior normal.    ED Results / Procedures / Treatments   Labs (all labs ordered are listed, but only abnormal results are displayed) Labs Reviewed  URINALYSIS, ROUTINE W REFLEX MICROSCOPIC - Abnormal; Notable for the following components:      Result Value   Color, Urine AMBER (*)    APPearance CLOUDY (*)    Glucose, UA 50 (*)    Hgb urine dipstick LARGE (*)    Ketones, ur 5 (*)    Protein, ur >=300 (*)    Leukocytes,Ua LARGE (*)    RBC / HPF >50 (*)    WBC, UA >50 (*)    Bacteria, UA MANY (*)    Non Squamous Epithelial 0-5 (*)    All other components within normal limits  BASIC METABOLIC PANEL - Abnormal; Notable for the following components:   Potassium 3.1 (*)    Glucose, Bld 319 (*)    BUN 27 (*)    Creatinine, Ser 3.05 (*)    Calcium 8.7 (*)    GFR, Estimated 18 (*)    All other components within normal limits  CBC - Abnormal; Notable for the following components:   RBC 3.73 (*)    Hemoglobin 11.7 (*)    HCT 35.1 (*)    RDW 16.3 (*)    All other components within normal limits  LIPASE, BLOOD - Abnormal;  Notable for the following components:   Lipase 192 (*)    All other components within normal limits  HEPATIC FUNCTION PANEL - Abnormal; Notable for the following components:   Total Protein 8.3 (*)    All other components within normal limits  URINE CULTURE  RESP PANEL BY RT-PCR (FLU A&B, COVID) [id number]    EKG None  Radiology CT Renal Stone Study  Result Date: 10/22/2021 CLINICAL DATA:  LEFT flank pain for 17 days question kidney stone EXAM: CT ABDOMEN AND PELVIS WITHOUT CONTRAST TECHNIQUE: Multidetector CT imaging of the abdomen and pelvis was performed following the standard protocol without IV contrast. COMPARISON:  06/03/2019 FINDINGS: Lower chest: Lung bases clear Hepatobiliary: Contracted gallbladder questionably containing small calculi. Liver unremarkable. Pancreas: Mild enlargement of the pancreatic tail with peripancreatic infiltration consistent with distal pancreatitis. Proximal tail, body, head, and uncinate process of pancreas are normal in appearance. No pancreatitis associated focal fluid collection/pseudocyst identified. Spleen: Normal appearance Adrenals/Urinary Tract: BILATERAL adrenal thickening greater on LEFT. No focal adrenal mass. LEFT kidney and LEFT ureter normal appearance. Cortical thinning RIGHT kidney with multiple large calculi again identified measuring up to 31 mm upper pole, 36 mm mid kidney, and 26 mm lower pole. No hydronephrosis. Ureters and bladder unremarkable. Stomach/Bowel: Increased stool in rectum. Scattered stool throughout remainder of colon. Appendix not visualized. Stomach and bowel loops otherwise unremarkable. Vascular/Lymphatic: Atherosclerotic calcifications aorta and iliac arteries without aneurysm. No adenopathy. Reproductive: Unremarkable uterus and adnexa Other: No free air or free fluid. Soft tissue thickening at umbilicus increased from previous exam, question related to panniculectomy and wound closure in 2022. Musculoskeletal: Unremarkable  IMPRESSION: Enlargement of pancreatic tail with peripancreatic infiltration consistent with distal pancreatitis. No pancreatitis associated focal fluid collection/pseudocyst identified. Recommend correlation with serum lipase. Multiple large RIGHT renal calculi without hydronephrosis or hydroureter. Increased stool in rectum. Soft tissue  thickening at umbilicus increased from previous exam, question related to panniculectomy and wound closure in 2022; recommend clinical correlation to exclude active cutaneous process/cellulitis. Aortic Atherosclerosis (ICD10-I70.0). Electronically Signed   By: Lavonia Dana M.D.   On: 10/22/2021 16:56    Procedures Procedures    Medications Ordered in ED Medications  sodium chloride 0.9 % bolus 1,000 mL (1,000 mLs Intravenous New Bag/Given 10/23/21 0713)  cefTRIAXone (ROCEPHIN) 1 g in sodium chloride 0.9 % 100 mL IVPB (1 g Intravenous New Bag/Given 10/23/21 0714)  morphine 4 MG/ML injection 4 mg (4 mg Intravenous Given 10/23/21 0715)  ondansetron (ZOFRAN) injection 4 mg (4 mg Intravenous Given 10/23/21 0715)    ED Course/ Medical Decision Making/ A&P                           Medical Decision Making  Patient with history of stage IV CKD here for evaluation of left upper quadrant/left flank pain for the last week.  Has associated nausea, poor appetite.  She did have vomiting but this is now resolved.  She does have tenderness on examination.  UA is consistent with UTI.  She has been on Macrobid but not improving.  BMP with CKD, slightly worse when compared to her priors.  CT stone study was obtained and is negative for obstructing stone.  It does have incidental finding of right-sided renal stone.  CT is significant for pancreatic inflammation.  Lipase is elevated.  Given her symptoms current picture is consistent with acute pancreatitis.  Given her multiple issues and plan to admit for IV fluids, IV antibiotics and symptom management.  Medicine consulted for  admission.        Final Clinical Impression(s) / ED Diagnoses Final diagnoses:  Acute pancreatitis without infection or necrosis, unspecified pancreatitis type  Acute UTI    Rx / DC Orders ED Discharge Orders     None         Quintella Reichert, MD 10/23/21 [phone]

## 2021-10-24 DIAGNOSIS — N179 Acute kidney failure, unspecified: Secondary | ICD-10-CM

## 2021-10-24 DIAGNOSIS — E890 Postprocedural hypothyroidism: Secondary | ICD-10-CM

## 2021-10-24 DIAGNOSIS — I10 Essential (primary) hypertension: Secondary | ICD-10-CM | POA: Diagnosis not present

## 2021-10-24 DIAGNOSIS — E669 Obesity, unspecified: Secondary | ICD-10-CM

## 2021-10-24 DIAGNOSIS — K859 Acute pancreatitis without necrosis or infection, unspecified: Secondary | ICD-10-CM | POA: Diagnosis not present

## 2021-10-24 DIAGNOSIS — E119 Type 2 diabetes mellitus without complications: Secondary | ICD-10-CM

## 2021-10-24 DIAGNOSIS — N184 Chronic kidney disease, stage 4 (severe): Secondary | ICD-10-CM

## 2021-10-24 DIAGNOSIS — N200 Calculus of kidney: Secondary | ICD-10-CM

## 2021-10-24 DIAGNOSIS — B20 Human immunodeficiency virus [HIV] disease: Secondary | ICD-10-CM | POA: Diagnosis not present

## 2021-10-24 HISTORY — DX: Chronic kidney disease, stage 4 (severe): N17.9

## 2021-10-24 LAB — COMPREHENSIVE METABOLIC PANEL
ALT: 18 U/L (ref 0–44)
AST: 26 U/L (ref 15–41)
Albumin: 3.2 g/dL — ABNORMAL LOW (ref 3.5–5.0)
Alkaline Phosphatase: 63 U/L (ref 38–126)
Anion gap: 13 (ref 5–15)
BUN: 23 mg/dL — ABNORMAL HIGH (ref 6–20)
CO2: 25 mmol/L (ref 22–32)
Calcium: 8.7 mg/dL — ABNORMAL LOW (ref 8.9–10.3)
Chloride: 103 mmol/L (ref 98–111)
Creatinine, Ser: 2.31 mg/dL — ABNORMAL HIGH (ref 0.44–1.00)
GFR, Estimated: 25 mL/min — ABNORMAL LOW (ref >60)
Glucose, Bld: 149 mg/dL — ABNORMAL HIGH (ref 70–99)
Potassium: 3.6 mmol/L (ref 3.5–5.1)
Sodium: 141 mmol/L (ref 135–145)
Total Bilirubin: 0.4 mg/dL (ref 0.3–1.2)
Total Protein: 7.3 g/dL (ref 6.5–8.1)

## 2021-10-24 LAB — GLUCOSE, CAPILLARY
Glucose-Capillary: 130 mg/dL — ABNORMAL HIGH (ref 70–99)
Glucose-Capillary: 232 mg/dL — ABNORMAL HIGH (ref 70–99)
Glucose-Capillary: 241 mg/dL — ABNORMAL HIGH (ref 70–99)
Glucose-Capillary: 248 mg/dL — ABNORMAL HIGH (ref 70–99)

## 2021-10-24 LAB — CBC
HCT: 33.9 % — ABNORMAL LOW (ref 36.0–46.0)
Hemoglobin: 10.9 g/dL — ABNORMAL LOW (ref 12.0–15.0)
MCH: 31.1 pg (ref 26.0–34.0)
MCHC: 32.2 g/dL (ref 30.0–36.0)
MCV: 96.6 fL (ref 80.0–100.0)
Platelets: 227 10*3/uL (ref 150–400)
RBC: 3.51 MIL/uL — ABNORMAL LOW (ref 3.87–5.11)
RDW: 16.8 % — ABNORMAL HIGH (ref 11.5–15.5)
WBC: 5.7 10*3/uL (ref 4.0–10.5)
nRBC: 0 % (ref 0.0–0.2)

## 2021-10-24 MED ORDER — CARVEDILOL 25 MG PO TABS
25.0000 mg | ORAL_TABLET | Freq: Two times a day (BID) | ORAL | Status: DC
  Administered 2021-10-24 – 2021-10-25 (×2): 25 mg via ORAL
  Filled 2021-10-24 (×2): qty 1

## 2021-10-24 MED ORDER — TAMOXIFEN CITRATE 10 MG PO TABS
20.0000 mg | ORAL_TABLET | Freq: Every day | ORAL | Status: DC
  Administered 2021-10-25: 20 mg via ORAL
  Filled 2021-10-24 (×2): qty 2

## 2021-10-24 MED ORDER — FAMOTIDINE 20 MG PO TABS
20.0000 mg | ORAL_TABLET | Freq: Every day | ORAL | Status: DC
  Administered 2021-10-25: 20 mg via ORAL
  Filled 2021-10-24: qty 1

## 2021-10-24 MED ORDER — SPIRONOLACTONE 25 MG PO TABS
25.0000 mg | ORAL_TABLET | Freq: Every day | ORAL | Status: DC
  Administered 2021-10-24 – 2021-10-25 (×2): 25 mg via ORAL
  Filled 2021-10-24 (×2): qty 1

## 2021-10-24 NOTE — Assessment & Plan Note (Addendum)
Chronic issue. Large calculi seen in right kidney. Concern for staghorn calculus. Discussed with on-call urologist who recommend patient seek urgent referral. Patient states her current urologist does not take her insurance, so she will need to (1) see a urologist that takes her insurance, (2) change insurance or (3) pay out of pocket. Continue Flomax on discharge. Proteus mirabilis found on urine culture. Patient treated with 3 days of Ceftriaxone and discharged with Ciprofloxacin to complete a 7 day course of antibiotics.

## 2021-10-24 NOTE — Assessment & Plan Note (Addendum)
Baseline creatinine of about 2-2.1. Secondary to poor oral intake. Creatinine of 3.05 on admission. Started on IV fluids with improved AKI. Complicated by renal stones. Will need repeat BMP as an outpatient. If worsening AKI, may need to see urology more urgently for management of large renal calculi.

## 2021-10-24 NOTE — Assessment & Plan Note (Addendum)
Continue Prilosec

## 2021-10-24 NOTE — Assessment & Plan Note (Addendum)
Continue Crestor 

## 2021-10-24 NOTE — Assessment & Plan Note (Addendum)
Continue tamoxifen 

## 2021-10-24 NOTE — Assessment & Plan Note (Addendum)
Continue levothyroxine 112 mcg daily

## 2021-10-24 NOTE — Assessment & Plan Note (Addendum)
Continue [name] and Descovy

## 2021-10-24 NOTE — Assessment & Plan Note (Addendum)
Unknown etiology. Elevated lipase with no complicated features on CT imaging. Patient has improved with bowel reset and analgesics. Advanced to soft diet. Discharged with analgesics with recommendations for PCP follow-up. -Advance to soft diet -Continue analgesics as needed

## 2021-10-24 NOTE — Assessment & Plan Note (Addendum)
Associated chronic renal disease and hyperlipidemia. Last hemoglobin A1C of 6.1%. Patient is on glipizide, Januvia, Jardiance as an outpatient. Patient started on SSI inpatient. Hold Jardiance on discharge secondary to positive urinalysis and urine culture in setting of nephrolithiasis.

## 2021-10-24 NOTE — Hospital Course (Signed)
[name] is a 56 y.o. female with a history of hypertension, hyperlipidemia, HIV, diabetes mellitus, breast cancer, GERD. Patient presented secondary to abdominal pain and found to have evidence of acute pancreatitis. Bowel rest, IV fluids and analgesics initiated with improvement.

## 2021-10-24 NOTE — Assessment & Plan Note (Signed)
Body mass index is 37.28 kg/m².

## 2021-10-24 NOTE — Assessment & Plan Note (Addendum)
Patient is on amlodipine, Coreg, candesartan and spironolactone as an outpatient. Blood pressure uncontrolled. Resume home amlodipine, Coreg, spironolactone. Hold candesartan on discharge secondary to AKI.

## 2021-10-24 NOTE — Progress Notes (Signed)
PROGRESS NOTE    [name]  GEX:[id number] DOB: Jan 07, 1966 DOA: 10/22/2021 PCP: [name], NP   Brief Narrative: [name] is a 56 y.o. female with a history of hypertension, hyperlipidemia, HIV, diabetes mellitus, breast cancer, GERD. Patient presented secondary to abdominal pain and found to have evidence of acute pancreatitis. Bowel rest, IV fluids and analgesics initiated with improvement.   Assessment & Plan:   * Acute pancreatitis- (present on admission) Unknown etiology. Elevated lipase with no complicated features on CT imaging. Patient has improved with bowel reset and analgesics -Advance to soft diet -Continue analgesics as needed  Acute renal failure superimposed on stage 4 chronic kidney disease (HCC) Baseline creatinine of about 2-2.1. Secondary to poor oral intake. Creatinine of 3.05 on admission. Started on IV fluids with improving AKI  Non-insulin dependent type 2 diabetes mellitus (HCC) Associated chronic renal disease and hyperlipidemia. Last hemoglobin A1C of 6.1%. Patient is on glipizide, Januvia, Jardiance as an outpatient. Patient started on SSI inpatient -Continue SSI  Renal calculi Chronic issue. Large calculi seen in right kidney. Concern for staghorn calculus. Discussed with on-call urologist who recommend patient seek urgent referral. Patient states her current urologist does not take her insurance, so she will need to (1) see a urologist that takes her insurance, (2) change insurance or (3) pay out of pocket.  Malignant neoplasm of lower-inner quadrant of right breast of female, estrogen receptor positive ([place]) -Continue tamoxifen  Hyperlipidemia- (present on admission) -Continue Crestor  Obesity (BMI 30-39.9)- (present on admission) Body mass index is 37.28 kg/m.  Postsurgical hypothyroidism- (present on admission) -Continue levothyroxine 112 mcg daily  GERD- (present on admission) -Continue Protonix  Essential  hypertension- (present on admission) Patient is on amlodipine, Coreg, candesartan and spironolactone as an outpatient. Blood pressure uncontrolled -Continue amlodipine -Restart home Coreg, spironolactone for now  HIV disease ([place])- (present on admission) -Continue [name] and Descovy       DVT prophylaxis: Heparin Code Status:   Code Status: Full Code Family Communication: None at bedside Disposition Plan: Discharge home likely in 24 hours pending ability to tolerate diet, continued improvement of AKI   Consultants:  None  Procedures:  None  Antimicrobials: Ceftriaxone    Subjective: Abdominal pain has improved. Ready to try a more solid diet  Objective: Vitals:   10/24/21 0855 10/24/21 1040 10/24/21 1322 10/24/21 1458  BP:  (!) 158/97 (!) 191/89 (!) 160/90  Pulse:   74   Resp:   18   Temp:   98 F (36.7 C)   TempSrc:   Oral   SpO2: 99%  99%   Weight:      Height:        Intake/Output Summary (Last 24 hours) at 10/24/2021 1558 Last data filed at 10/24/2021 1342 Gross per 24 hour  Intake 3392.71 ml  Output --  Net 3392.71 ml   Filed Weights   10/22/21 1636 10/23/21 0540  Weight: 101.6 kg 101.6 kg    Examination:  General exam: Appears calm and comfortable  Respiratory system: Clear to auscultation. Respiratory effort normal. Cardiovascular system: S1 & S2 heard, RRR. No murmurs, rubs, gallops or clicks. Gastrointestinal system: Abdomen is nondistended, soft and nontender. No organomegaly or masses felt. Normal bowel sounds heard. Central nervous system: Alert and oriented. No focal neurological deficits. Musculoskeletal: No edema. No calf tenderness Skin: No cyanosis. No rashes Psychiatry: Judgement and insight appear normal. Mood & affect appropriate.     Data Reviewed: I have personally reviewed  following labs and imaging studies  CBC Lab Results  Component Value Date   WBC 5.7 10/24/2021   RBC 3.51 (L) 10/24/2021   HGB 10.9 (L) 10/24/2021    HCT 33.9 (L) 10/24/2021   MCV 96.6 10/24/2021   MCH 31.1 10/24/2021   PLT 227 10/24/2021   MCHC 32.2 10/24/2021   RDW 16.8 (H) 10/24/2021   LYMPHSABS 2.4 10/23/2021   MONOABS 0.7 10/23/2021   EOSABS 0.2 10/23/2021   BASOSABS 0.0 25/36/6440     Last metabolic panel Lab Results  Component Value Date   NA 141 10/24/2021   K 3.6 10/24/2021   CL 103 10/24/2021   CO2 25 10/24/2021   BUN 23 (H) 10/24/2021   CREATININE 2.31 (H) 10/24/2021   GLUCOSE 149 (H) 10/24/2021   GFRNONAA 25 (L) 10/24/2021   GFRAA 39 (L) 07/14/2020   CALCIUM 8.7 (L) 10/24/2021   PROT 7.3 10/24/2021   ALBUMIN 3.2 (L) 10/24/2021   BILITOT 0.4 10/24/2021   ALKPHOS 63 10/24/2021   AST 26 10/24/2021   ALT 18 10/24/2021   ANIONGAP 13 10/24/2021    CBG (last 3)  Recent Labs    10/23/21 1956 10/24/21 0739 10/24/21 1157  GLUCAP 173* 130* 232*     GFR: Estimated Creatinine Clearance: 33.3 mL/min (A) (by C-G formula based on SCr of 2.31 mg/dL (H)).  Coagulation Profile: No results for input(s): INR, PROTIME in the last 168 hours.  Recent Results (from the past 240 hour(s))  Urine Culture     Status: None (Preliminary result)   Collection Time: 10/22/21  4:36 PM   Specimen: Urine, Clean Catch  Result Value Ref Range Status   Specimen Description   Final    URINE, CLEAN CATCH Performed at [facility] [place] [address] [place], [place] [address]    Special Requests   Final    NONE Performed at [facility], [place] [address] [place], [place] [address]    Culture   Final    CULTURE REINCUBATED FOR BETTER GROWTH Performed at [facility], [place] [address], [place], [place] [address]    Report Status PENDING  Incomplete  Resp Panel by RT-PCR (Flu A&B, Covid) Nasopharyngeal Swab     Status: None   Collection Time: 10/23/21  6:51 AM   Specimen: Nasopharyngeal Swab; Nasopharyngeal(NP) swabs in vial transport medium  Result Value Ref Range Status   SARS  Coronavirus 2 by RT PCR NEGATIVE NEGATIVE Final    Comment: (NOTE) SARS-CoV-2 target nucleic acids are NOT DETECTED.  The SARS-CoV-2 RNA is generally detectable in upper respiratory specimens during the acute phase of infection. The lowest concentration of SARS-CoV-2 viral copies this assay can detect is 138 copies/mL. A negative result does not preclude SARS-Cov-2 infection and should not be used as the sole basis for treatment or other patient management decisions. A negative result may occur with  improper specimen collection/handling, submission of specimen other than nasopharyngeal swab, presence of viral mutation(s) within the areas targeted by this assay, and inadequate number of viral copies(<138 copies/mL). A negative result must be combined with clinical observations, patient history, and epidemiological information. The expected result is Negative.  Fact Sheet for Patients:  EntrepreneurPulse.com.au  Fact Sheet for Healthcare Providers:  IncredibleEmployment.be  This test is no t yet approved or cleared by the [place] FDA and  has been authorized for detection and/or diagnosis of SARS-CoV-2 by FDA under an Emergency Use Authorization (EUA). This EUA will remain  in effect (meaning this test can be used) for the duration of the COVID-19 declaration under Section 564(b)(1) of the Act, 21 U.S.C.section 360bbb-3(b)(1), unless the authorization is terminated  or revoked sooner.       Influenza A by PCR NEGATIVE NEGATIVE Final   Influenza B by PCR NEGATIVE NEGATIVE Final    Comment: (NOTE) The Xpert Xpress SARS-CoV-2/FLU/RSV plus assay is intended as an aid in the diagnosis of influenza from Nasopharyngeal swab specimens and should not be used as a sole basis for treatment. Nasal washings and aspirates are unacceptable for Xpert Xpress SARS-CoV-2/FLU/RSV testing.  Fact Sheet for  Patients: EntrepreneurPulse.com.au  Fact Sheet for Healthcare Providers: IncredibleEmployment.be  This test is not yet approved or cleared by the [place] FDA and has been authorized for detection and/or diagnosis of SARS-CoV-2 by FDA under an Emergency Use Authorization (EUA). This EUA will remain in effect (meaning this test can be used) for the duration of the COVID-19 declaration under Section 564(b)(1) of the Act, 21 U.S.C. section 360bbb-3(b)(1), unless the authorization is terminated or revoked.  Performed at [facility], [place] [address] [place], [place] [address]         Radiology Studies: CT Renal Stone Study  Result Date: 10/22/2021 CLINICAL DATA:  LEFT flank pain for 17 days question kidney stone EXAM: CT ABDOMEN AND PELVIS WITHOUT CONTRAST TECHNIQUE: Multidetector CT imaging of the abdomen and pelvis was performed following the standard protocol without IV contrast. COMPARISON:  06/03/2019 FINDINGS: Lower chest: Lung bases clear Hepatobiliary: Contracted gallbladder questionably containing small calculi. Liver unremarkable. Pancreas: Mild enlargement of the pancreatic tail with peripancreatic infiltration consistent with distal pancreatitis. Proximal tail, body, head, and uncinate process of pancreas are normal in appearance. No pancreatitis associated focal fluid collection/pseudocyst identified. Spleen: Normal appearance Adrenals/Urinary Tract: BILATERAL adrenal thickening greater on LEFT. No focal adrenal mass. LEFT kidney and LEFT ureter normal appearance. Cortical thinning RIGHT kidney with multiple large calculi again identified measuring up to 31 mm upper pole, 36 mm mid kidney, and 26 mm lower pole. No hydronephrosis. Ureters and bladder unremarkable. Stomach/Bowel: Increased stool in rectum. Scattered stool throughout remainder of colon. Appendix not visualized. Stomach and bowel loops otherwise unremarkable.  Vascular/Lymphatic: Atherosclerotic calcifications aorta and iliac arteries without aneurysm. No adenopathy. Reproductive: Unremarkable uterus and adnexa Other: No free air or free fluid. Soft tissue thickening at umbilicus increased from previous exam, question related to panniculectomy and wound closure in 2022. Musculoskeletal: Unremarkable IMPRESSION: Enlargement of pancreatic tail with peripancreatic infiltration consistent with distal pancreatitis. No pancreatitis associated focal fluid collection/pseudocyst identified. Recommend correlation with serum lipase. Multiple large RIGHT renal calculi without hydronephrosis or hydroureter. Increased stool in rectum. Soft tissue thickening at umbilicus increased from previous exam, question related to panniculectomy and wound closure in 2022; recommend clinical correlation to exclude active cutaneous process/cellulitis. Aortic Atherosclerosis (ICD10-I70.0). Electronically Signed   By: Lavonia Dana M.D.   On: 10/22/2021 16:56        Scheduled Meds:  amLODipine  5 mg Oral Daily   budesonide (PULMICORT) nebulizer solution  0.25 mg Nebulization BID   dolutegravir  50 mg Oral Daily   And   emtricitabine-tenofovir AF  1 tablet Oral Daily   [START ON 10/25/2021] famotidine  20 mg Oral Daily   fluticasone  2 spray Each Nare Daily   gabapentin  300 mg Oral Daily   heparin  5,000 Units Subcutaneous Q8H   hydrALAZINE  50 mg Oral Q8H   insulin aspart  0-15  Units Subcutaneous TID WC   insulin aspart  0-5 Units Subcutaneous QHS   levothyroxine  112 mcg Oral Daily   pantoprazole  40 mg Oral Daily   potassium chloride  40 mEq Oral Daily   rosuvastatin  10 mg Oral Daily   tamsulosin  0.4 mg Oral Daily   Continuous Infusions:  sodium chloride 125 mL/hr at 10/24/21 1344   cefTRIAXone (ROCEPHIN)  IV 2 g (10/24/21 1039)     LOS: 1 day     Cordelia Poche, MD [facility] 10/24/2021, 3:58 PM  If 7PM-7AM, please contact night-coverage [email or web address]

## 2021-10-25 DIAGNOSIS — K859 Acute pancreatitis without necrosis or infection, unspecified: Secondary | ICD-10-CM | POA: Diagnosis not present

## 2021-10-25 DIAGNOSIS — N179 Acute kidney failure, unspecified: Secondary | ICD-10-CM | POA: Diagnosis not present

## 2021-10-25 DIAGNOSIS — N184 Chronic kidney disease, stage 4 (severe): Secondary | ICD-10-CM | POA: Diagnosis not present

## 2021-10-25 LAB — BASIC METABOLIC PANEL
Anion gap: 8 (ref 5–15)
BUN: 20 mg/dL (ref 6–20)
CO2: 25 mmol/L (ref 22–32)
Calcium: 8.4 mg/dL — ABNORMAL LOW (ref 8.9–10.3)
Chloride: 107 mmol/L (ref 98–111)
Creatinine, Ser: 2.38 mg/dL — ABNORMAL HIGH (ref 0.44–1.00)
GFR, Estimated: 24 mL/min — ABNORMAL LOW (ref >60)
Glucose, Bld: 191 mg/dL — ABNORMAL HIGH (ref 70–99)
Potassium: 3.6 mmol/L (ref 3.5–5.1)
Sodium: 140 mmol/L (ref 135–145)

## 2021-10-25 LAB — GLUCOSE, CAPILLARY: Glucose-Capillary: 167 mg/dL — ABNORMAL HIGH (ref 70–99)

## 2021-10-25 LAB — TRIGLYCERIDES: Triglycerides: 182 mg/dL — ABNORMAL HIGH (ref <150)

## 2021-10-25 MED ORDER — OXYCODONE-ACETAMINOPHEN 5-325 MG PO TABS
1.0000 | ORAL_TABLET | Freq: Four times a day (QID) | ORAL | 0 refills | Status: DC | PRN
Start: 2021-10-25 — End: 2021-10-28

## 2021-10-25 MED ORDER — CANDESARTAN CILEXETIL 32 MG PO TABS: ORAL_TABLET | ORAL | 11 refills | Status: DC

## 2021-10-25 MED ORDER — EMPAGLIFLOZIN 10 MG PO TABS: ORAL_TABLET | ORAL | 6 refills | Status: DC

## 2021-10-25 MED ORDER — CIPROFLOXACIN HCL 500 MG PO TABS: 500.0000 mg | ORAL_TABLET | Freq: Two times a day (BID) | ORAL | 0 refills | Status: AC

## 2021-10-25 NOTE — Discharge Summary (Signed)
Physician Discharge Summary  [name] ZOX:[id number] DOB: 15-Dec-1965 DOA: 10/22/2021  PCP: [name], NP  Admit date: 10/22/2021 Discharge date: 10/25/2021  Admitted From: Home Disposition: Home  Recommendations for Outpatient Follow-up:  Follow up with PCP in 1 week Please obtain BMP at next PCP office visit Urgent referral to Urology (in-network) for management of right-sided nephrolithiasis Please follow up on the following pending results: Urine culture  Home Health: None Equipment/Devices: None  Discharge Condition: Stable CODE STATUS: Full code Diet recommendation: Low fat, soft   Brief/Interim Summary:  Admission HPI written by Jonnie Finner, DO   HPI: [name] is a 56 y.o. female with medical history significant of HTN, HIV, HLD, DM2, GERD, breast CA. Presenting with abdominal pain. She started having left abdominal pain and flank pain about 10 days ago. She thought it was gas pain, so she tried some OTC meds. This didn't help. She eventually went to urgent care and was told that she had a kidney stone. She was given macrobid, baclofen and tamsulosin. These did not help. She started to develop N/V after initiating those medication. Her symptoms worsened through last night, so she decided to come to the did for assistance. She denies any other aggravating or alleviating factors.   Hospital course:  * Acute pancreatitis- (present on admission) Unknown etiology. Elevated lipase with no complicated features on CT imaging. Patient has improved with bowel reset and analgesics. Advanced to soft diet. Discharged with analgesics with recommendations for PCP follow-up. -Advance to soft diet -Continue analgesics as needed  Acute renal failure superimposed on stage 4 chronic kidney disease (HCC) Baseline creatinine of about 2-2.1. Secondary to poor oral intake. Creatinine of 3.05 on admission. Started on IV fluids with improved AKI. Complicated by  renal stones. Will need repeat BMP as an outpatient. If worsening AKI, may need to see urology more urgently for management of large renal calculi.  Non-insulin dependent type 2 diabetes mellitus (HCC) Associated chronic renal disease and hyperlipidemia. Last hemoglobin A1C of 6.1%. Patient is on glipizide, Januvia, Jardiance as an outpatient. Patient started on SSI inpatient. Hold Jardiance on discharge secondary to positive urinalysis and urine culture in setting of nephrolithiasis.  Renal calculi Chronic issue. Large calculi seen in right kidney. Concern for staghorn calculus. Discussed with on-call urologist who recommend patient seek urgent referral. Patient states her current urologist does not take her insurance, so she will need to (1) see a urologist that takes her insurance, (2) change insurance or (3) pay out of pocket. Continue Flomax on discharge. Proteus mirabilis found on urine culture. Patient treated with 3 days of Ceftriaxone and discharged with Ciprofloxacin to complete a 7 day course of antibiotics.  Malignant neoplasm of lower-inner quadrant of right breast of female, estrogen receptor positive ([place]) Continue tamoxifen  Hyperlipidemia- (present on admission) Continue Crestor  Obesity (BMI 30-39.9)- (present on admission) Body mass index is 37.28 kg/m.  Postsurgical hypothyroidism- (present on admission) Continue levothyroxine 112 mcg daily  GERD- (present on admission) Continue Prilosec  Essential hypertension- (present on admission) Patient is on amlodipine, Coreg, candesartan and spironolactone as an outpatient. Blood pressure uncontrolled. Resume home amlodipine, Coreg, spironolactone. Hold candesartan on discharge secondary to AKI.  HIV disease ([place])- (present on admission) Continue [name] and Descovy     Discharge Instructions  Discharge Instructions     Call MD for:  severe uncontrolled pain   Complete by: As directed    Call MD for:  temperature  >  100.4   Complete by: As directed       Allergies as of 10/25/2021       Reactions   Genvoya [elviteg-cobic-emtricit-tenofaf] Hives   Lisinopril Cough   Tegaderm Ag Mesh [silver] Itching        Medication List     TAKE these medications    acetaminophen 500 MG tablet Commonly known as: TYLENOL Take 1,000 mg by mouth every 6 (six) hours as needed for mild pain.   albuterol 108 (90 Base) MCG/ACT inhaler Commonly known as: VENTOLIN HFA Inhale 2 puffs into the lungs every 6 (six) hours as needed for wheezing or shortness of breath.   allopurinol 100 MG tablet Commonly known as: ZYLOPRIM TAKE 1 TABLET BY MOUTH DAILY FOR GOUT PREVENTION What changed: See the new instructions.   amLODipine 5 MG tablet Commonly known as: NORVASC Take 1 tablet (5 mg total) by mouth daily.   baclofen 10 MG tablet Commonly known as: LIORESAL Take 10 mg by mouth 3 (three) times daily as needed for muscle spasms.   candesartan 32 MG tablet Commonly known as: ATACAND PLEASE SEE YOUR PCP PRIOR TO RESTARTING What changed:  how much to take how to take this when to take this additional instructions   carvedilol 25 MG tablet Commonly known as: COREG TAKE 1 TABLET BY MOUTH TWICE A DAY What changed: when to take this   cetirizine 10 MG tablet Commonly known as: ZYRTEC Take 1 tablet by mouth at bedtime for allergies. What changed:  how much to take how to take this when to take this reasons to take this additional instructions   ciprofloxacin 500 MG tablet Commonly known as: Cipro Take 1 tablet (500 mg total) by mouth 2 (two) times daily for 4 days. Start taking on: October 26, 2021   dolutegravir 50 MG tablet Commonly known as: [name] Take 1 tablet (50 mg total) by mouth daily.   empagliflozin 10 MG Tabs tablet Commonly known as: Jardiance PLEASE DISCUSS WITH YOUR PCP PRIOR TO RESTARTING What changed:  how much to take how to take this when to take this additional  instructions   emtricitabine-tenofovir AF 200-25 MG tablet Commonly known as: DESCOVY Take 1 tablet by mouth daily.   famotidine 20 MG tablet Commonly known as: PEPCID TAKE 1 TO 2 TABLETS BY MOUTH AT BEDTIME FOR HOARSE VOICE/REFLUX What changed:  how much to take how to take this when to take this additional instructions   Flovent HFA 110 MCG/ACT inhaler Generic drug: fluticasone TAKE 1 PUFF BY MOUTH TWICE A DAY What changed: See the new instructions.   fluticasone 50 MCG/ACT nasal spray Commonly known as: FLONASE Place 2 sprays into both nostrils daily.   FreeStyle Lite w/Device Kit Inject 1 each into the skin daily.   gabapentin 300 MG capsule Commonly known as: NEURONTIN Take 300 mg by mouth daily.   glipiZIDE 5 MG tablet Commonly known as: GLUCOTROL TAKE 1 TABLET (5 MG TOTAL) BY MOUTH 2 (TWO) TIMES DAILY BEFORE A MEAL. FOR DIABETES. What changed: See the new instructions.   levothyroxine 112 MCG tablet Commonly known as: SYNTHROID TAKE 1 TABLET BY MOUTH EVERY DAY BEFORE BREAKFAST What changed:  See the new instructions. Another medication with the same name was removed. Continue taking this medication, and follow the directions you see here.   omeprazole 40 MG capsule Commonly known as: PRILOSEC TAKE 1 CAPSULE BY MOUTH DAILY FOR REFLUX What changed: See the new instructions.   onetouch ultrasoft lancets Use  as instructed to test blood sugar daily   OneTouch Verio test strip Generic drug: glucose blood USE AS INSTRUCTED TO TEST BLOOD SUGAR DAILY   oxyCODONE-acetaminophen 5-325 MG tablet Commonly known as: Percocet Take 1 tablet by mouth every 6 (six) hours as needed for severe pain.   rosuvastatin 10 MG tablet Commonly known as: CRESTOR TAKE 1 TABLET BY MOUTH EVERY DAY FOR CHOLESTEROL What changed: See the new instructions.   sertraline 50 MG tablet Commonly known as: ZOLOFT TAKE 1 TABLET (50 MG TOTAL) BY MOUTH DAILY. FOR ANXIETY AND DEPRESSION.    sitaGLIPtin 50 MG tablet Commonly known as: Januvia Take 1 tablet (50 mg total) by mouth daily. For diabetes.   spironolactone 25 MG tablet Commonly known as: ALDACTONE Take 1 tablet (25 mg total) by mouth daily.   tamoxifen 20 MG tablet Commonly known as: NOLVADEX Take 1 tablet (20 mg total) by mouth daily.   tamsulosin 0.4 MG Caps capsule Commonly known as: FLOMAX Take 0.4 mg by mouth daily.        Follow-up Information     [name], NP. Schedule an appointment as soon as possible for a visit in 2 day(s).   Specialty: Internal Medicine Why: For hospital follow-up Contact information: [place] [address] [phone]                Allergies  Allergen Reactions   Genvoya [Elviteg-Cobic-Emtricit-Tenofaf] Hives   Lisinopril Cough   Tegaderm Ag Mesh [Silver] Itching    Consultations: Urology (curbside)   Procedures/Studies: CT Renal Stone Study  Result Date: 10/22/2021 CLINICAL DATA:  LEFT flank pain for 17 days question kidney stone EXAM: CT ABDOMEN AND PELVIS WITHOUT CONTRAST TECHNIQUE: Multidetector CT imaging of the abdomen and pelvis was performed following the standard protocol without IV contrast. COMPARISON:  06/03/2019 FINDINGS: Lower chest: Lung bases clear Hepatobiliary: Contracted gallbladder questionably containing small calculi. Liver unremarkable. Pancreas: Mild enlargement of the pancreatic tail with peripancreatic infiltration consistent with distal pancreatitis. Proximal tail, body, head, and uncinate process of pancreas are normal in appearance. No pancreatitis associated focal fluid collection/pseudocyst identified. Spleen: Normal appearance Adrenals/Urinary Tract: BILATERAL adrenal thickening greater on LEFT. No focal adrenal mass. LEFT kidney and LEFT ureter normal appearance. Cortical thinning RIGHT kidney with multiple large calculi again identified measuring up to 31 mm upper pole, 36 mm mid kidney, and 26 mm  lower pole. No hydronephrosis. Ureters and bladder unremarkable. Stomach/Bowel: Increased stool in rectum. Scattered stool throughout remainder of colon. Appendix not visualized. Stomach and bowel loops otherwise unremarkable. Vascular/Lymphatic: Atherosclerotic calcifications aorta and iliac arteries without aneurysm. No adenopathy. Reproductive: Unremarkable uterus and adnexa Other: No free air or free fluid. Soft tissue thickening at umbilicus increased from previous exam, question related to panniculectomy and wound closure in 2022. Musculoskeletal: Unremarkable IMPRESSION: Enlargement of pancreatic tail with peripancreatic infiltration consistent with distal pancreatitis. No pancreatitis associated focal fluid collection/pseudocyst identified. Recommend correlation with serum lipase. Multiple large RIGHT renal calculi without hydronephrosis or hydroureter. Increased stool in rectum. Soft tissue thickening at umbilicus increased from previous exam, question related to panniculectomy and wound closure in 2022; recommend clinical correlation to exclude active cutaneous process/cellulitis. Aortic Atherosclerosis (ICD10-I70.0). Electronically Signed   By: Lavonia Dana M.D.   On: 10/22/2021 16:56     Subjective: Abdominal pain significantly improved.  Discharge Exam: Vitals:   10/25/21 0800 10/25/21 0922  BP:    Pulse:    Resp: 16   Temp:    SpO2:  97%   Vitals:   10/24/21 2046 10/25/21 0509 10/25/21 0800 10/25/21 0922  BP: (!) 181/94 (!) 175/89    Pulse: 73 64    Resp: _0 Temp: 98.2 F (36.8 C) 98.2 F (36.8 C)    TempSrc: Oral Oral    SpO2: 94% 94%  97%  Weight:      Height:        General: Pt is alert, awake, not in acute distress Cardiovascular: RRR, S1/S2 +, no rubs, no gallops Respiratory: CTA bilaterally, no wheezing, no rhonchi Abdominal: Soft, mild tenderness, ND, bowel sounds + Extremities: no edema, no cyanosis    The results of significant diagnostics from this  hospitalization (including imaging, microbiology, ancillary and laboratory) are listed below for reference.     Microbiology: Recent Results (from the past 240 hour(s))  Urine Culture     Status: Abnormal (Preliminary result)   Collection Time: 10/22/21  4:36 PM   Specimen: Urine, Clean Catch  Result Value Ref Range Status   Specimen Description   Final    URINE, CLEAN CATCH Performed at [facility], [place] [address], [place], [place] [address]    Special Requests   Final    NONE Performed at [facility], [place] [address] [place], [place] [address]    Culture (A)  Final    >=100,000 COLONIES/mL PROTEUS MIRABILIS SUSCEPTIBILITIES TO FOLLOW Performed at [facility], [place] [address] [place], [place] [address]    Report Status PENDING  Incomplete  Resp Panel by RT-PCR (Flu A&B, Covid) Nasopharyngeal Swab     Status: None   Collection Time: 10/23/21  6:51 AM   Specimen: Nasopharyngeal Swab; Nasopharyngeal(NP) swabs in vial transport medium  Result Value Ref Range Status   SARS Coronavirus 2 by RT PCR NEGATIVE NEGATIVE Final    Comment: (NOTE) SARS-CoV-2 target nucleic acids are NOT DETECTED.  The SARS-CoV-2 RNA is generally detectable in upper respiratory specimens during the acute phase of infection. The lowest concentration of SARS-CoV-2 viral copies this assay can detect is 138 copies/mL. A negative result does not preclude SARS-Cov-2 infection and should not be used as the sole basis for treatment or other patient management decisions. A negative result may occur with  improper specimen collection/handling, submission of specimen other than nasopharyngeal swab, presence of viral mutation(s) within the areas targeted by this assay, and inadequate number of viral copies(<138 copies/mL). A negative result must be combined with clinical observations, patient history, and epidemiological information. The expected result is  Negative.  Fact Sheet for Patients:  EntrepreneurPulse.com.au  Fact Sheet for Healthcare Providers:  IncredibleEmployment.be  This test is no t yet approved or cleared by the [place] FDA and  has been authorized for detection and/or diagnosis of SARS-CoV-2 by FDA under an Emergency Use Authorization (EUA). This EUA will remain  in effect (meaning this test can be used) for the duration of the COVID-19 declaration under Section 564(b)(1) of the Act, 21 U.S.C.section 360bbb-3(b)(1), unless the authorization is terminated  or revoked sooner.       Influenza A by PCR NEGATIVE NEGATIVE Final   Influenza B by PCR NEGATIVE NEGATIVE Final    Comment: (NOTE) The Xpert Xpress SARS-CoV-2/FLU/RSV plus assay is intended as an aid in the diagnosis of influenza from Nasopharyngeal swab specimens and should not be used as a sole basis for treatment. Nasal washings and aspirates are unacceptable for Xpert Xpress SARS-CoV-2/FLU/RSV testing.  Fact Sheet for Patients:  EntrepreneurPulse.com.au  Fact Sheet for Healthcare Providers: IncredibleEmployment.be  This test is not yet approved or cleared by the [place] FDA and has been authorized for detection and/or diagnosis of SARS-CoV-2 by FDA under an Emergency Use Authorization (EUA). This EUA will remain in effect (meaning this test can be used) for the duration of the COVID-19 declaration under Section 564(b)(1) of the Act, 21 U.S.C. section 360bbb-3(b)(1), unless the authorization is terminated or revoked.  Performed at [facility], [place] [address] [place], [place] [address]      Labs: BNP (last 3 results) No results for input(s): BNP in the last 8760 hours. Basic Metabolic Panel: Recent Labs  Lab 10/22/21 1649 10/23/21 1115 10/24/21 0408 10/25/21 0637  NA 135 137 141 140  K 3.1* 3.2* 3.6 3.6  CL 98 100 103 107  CO2 _0 GLUCOSE 319* 209* 149* 191*  BUN 27* 29* 23* 20  CREATININE 3.05* 2.64* 2.31* 2.38*  CALCIUM 8.7* 8.3* 8.7* 8.4*  MG  --  2.0  --   --    Liver Function Tests: Recent Labs  Lab 10/23/21 0615 10/23/21 1115 10/24/21 0408  AST _1 ALT _2 ALKPHOS 82 72 63  BILITOT 0.4 0.4 0.4  PROT 8.3* 7.1 7.3  ALBUMIN 3.6 3.0* 3.2*   Recent Labs  Lab 10/23/21 0615  LIPASE 192*   No results for input(s): AMMONIA in the last 168 hours. CBC: Recent Labs  Lab 10/22/21 1649 10/23/21 1115 10/24/21 0408  WBC 8.7 7.7 5.7  NEUTROABS  --  4.3  --   HGB 11.7* 10.8* 10.9*  HCT 35.1* 32.1* 33.9*  MCV 94.1 95.3 96.6  PLT 238 221 227   Cardiac Enzymes: No results for input(s): CKTOTAL, CKMB, CKMBINDEX, TROPONINI in the last 168 hours. BNP: Invalid input(s): POCBNP CBG: Recent Labs  Lab 10/24/21 0739 10/24/21 1157 10/24/21 1702 10/24/21 2042 10/25/21 0757  GLUCAP 130* 232* 241* 248* 167*   D-Dimer No results for input(s): DDIMER in the last 72 hours. Hgb A1c No results for input(s): HGBA1C in the last 72 hours. Lipid Profile Recent Labs    10/25/21 0637  TRIG 182*   Thyroid function studies No results for input(s): TSH, T4TOTAL, T3FREE, THYROIDAB in the last 72 hours.  Invalid input(s): FREET3 Anemia work up No results for input(s): VITAMINB12, FOLATE, FERRITIN, TIBC, IRON, RETICCTPCT in the last 72 hours. Urinalysis    Component Value Date/Time   COLORURINE AMBER (A) 10/22/2021 1636   APPEARANCEUR CLOUDY (A) 10/22/2021 1636   LABSPEC 1.018 10/22/2021 1636   PHURINE 5.0 10/22/2021 1636   GLUCOSEU 50 (A) 10/22/2021 1636   GLUCOSEU NEG mg/dL 01/23/2008 1418   HGBUR LARGE (A) 10/22/2021 1636   BILIRUBINUR NEGATIVE 10/22/2021 1636   BILIRUBINUR negative 04/30/2021 1119   KETONESUR 5 (A) 10/22/2021 1636   PROTEINUR >=300 (A) 10/22/2021 1636   UROBILINOGEN 0.2 04/30/2021 1119   UROBILINOGEN 0.2 10/21/2014 2347   NITRITE NEGATIVE 10/22/2021 1636    LEUKOCYTESUR LARGE (A) 10/22/2021 1636   Sepsis Labs Invalid input(s): PROCALCITONIN,  WBC,  LACTICIDVEN Microbiology Recent Results (from the past 240 hour(s))  Urine Culture     Status: Abnormal (Preliminary result)   Collection Time: 10/22/21  4:36 PM   Specimen: Urine, Clean Catch  Result Value Ref Range Status   Specimen Description   Final    URINE, CLEAN CATCH Performed at [facility], [address] [place], [place]  [address]    Special Requests   Final    NONE Performed at [facility], [place] [address] [place], [place] [address]    Culture (A)  Final    >=100,000 COLONIES/mL PROTEUS MIRABILIS SUSCEPTIBILITIES TO FOLLOW Performed at [facility], [place] [address] [place], [place] [address]    Report Status PENDING  Incomplete  Resp Panel by RT-PCR (Flu A&B, Covid) Nasopharyngeal Swab     Status: None   Collection Time: 10/23/21  6:51 AM   Specimen: Nasopharyngeal Swab; Nasopharyngeal(NP) swabs in vial transport medium  Result Value Ref Range Status   SARS Coronavirus 2 by RT PCR NEGATIVE NEGATIVE Final    Comment: (NOTE) SARS-CoV-2 target nucleic acids are NOT DETECTED.  The SARS-CoV-2 RNA is generally detectable in upper respiratory specimens during the acute phase of infection. The lowest concentration of SARS-CoV-2 viral copies this assay can detect is 138 copies/mL. A negative result does not preclude SARS-Cov-2 infection and should not be used as the sole basis for treatment or other patient management decisions. A negative result may occur with  improper specimen collection/handling, submission of specimen other than nasopharyngeal swab, presence of viral mutation(s) within the areas targeted by this assay, and inadequate number of viral copies(<138 copies/mL). A negative result must be combined with clinical observations, patient history, and epidemiological information. The expected result is Negative.  Fact  Sheet for Patients:  EntrepreneurPulse.com.au  Fact Sheet for Healthcare Providers:  IncredibleEmployment.be  This test is no t yet approved or cleared by the [place] FDA and  has been authorized for detection and/or diagnosis of SARS-CoV-2 by FDA under an Emergency Use Authorization (EUA). This EUA will remain  in effect (meaning this test can be used) for the duration of the COVID-19 declaration under Section 564(b)(1) of the Act, 21 U.S.C.section 360bbb-3(b)(1), unless the authorization is terminated  or revoked sooner.       Influenza A by PCR NEGATIVE NEGATIVE Final   Influenza B by PCR NEGATIVE NEGATIVE Final    Comment: (NOTE) The Xpert Xpress SARS-CoV-2/FLU/RSV plus assay is intended as an aid in the diagnosis of influenza from Nasopharyngeal swab specimens and should not be used as a sole basis for treatment. Nasal washings and aspirates are unacceptable for Xpert Xpress SARS-CoV-2/FLU/RSV testing.  Fact Sheet for Patients: EntrepreneurPulse.com.au  Fact Sheet for Healthcare Providers: IncredibleEmployment.be  This test is not yet approved or cleared by the [place] FDA and has been authorized for detection and/or diagnosis of SARS-CoV-2 by FDA under an Emergency Use Authorization (EUA). This EUA will remain in effect (meaning this test can be used) for the duration of the COVID-19 declaration under Section 564(b)(1) of the Act, 21 U.S.C. section 360bbb-3(b)(1), unless the authorization is terminated or revoked.  Performed at [facility], [place] [address] [place], [place] [address]      Time coordinating discharge: 35 minutes  SIGNED:   Cordelia Poche, MD [facility] 10/25/2021, 9:46 AM

## 2021-10-25 NOTE — Plan of Care (Signed)
°  Problem: Education: Goal: Knowledge of General Education information will improve Description: Including pain rating scale, medication(s)/side effects and non-pharmacologic comfort measures Outcome: Adequate for Discharge   Problem: Health Behavior/Discharge Planning: Goal: Ability to manage health-related needs will improve Outcome: Adequate for Discharge   Problem: Clinical Measurements: Goal: Ability to maintain clinical measurements within normal limits will improve Outcome: Adequate for Discharge Goal: Will remain free from infection Outcome: Adequate for Discharge Goal: Diagnostic test results will improve Outcome: Adequate for Discharge Goal: Respiratory complications will improve Outcome: Adequate for Discharge Goal: Cardiovascular complication will be avoided Outcome: Adequate for Discharge   Problem: Clinical Measurements: Goal: Will remain free from infection Outcome: Adequate for Discharge   Problem: Clinical Measurements: Goal: Diagnostic test results will improve Outcome: Adequate for Discharge   Problem: Clinical Measurements: Goal: Respiratory complications will improve Outcome: Adequate for Discharge   Problem: Clinical Measurements: Goal: Cardiovascular complication will be avoided Outcome: Adequate for Discharge   Problem: Coping: Goal: Level of anxiety will decrease Outcome: Adequate for Discharge   Problem: Elimination: Goal: Will not experience complications related to bowel motility Outcome: Adequate for Discharge Goal: Will not experience complications related to urinary retention Outcome: Adequate for Discharge   Problem: Pain Managment: Goal: General experience of comfort will improve Outcome: Adequate for Discharge   Problem: Safety: Goal: Ability to remain free from injury will improve Outcome: Adequate for Discharge   Problem: Skin Integrity: Goal: Risk for impaired skin integrity will decrease Outcome: Adequate for Discharge

## 2021-10-25 NOTE — Discharge Instructions (Addendum)
[name],  You were in the hospital with acute pancreatitis (inflammation of your pancreas). This was treated with bowel rest, IV fluids and pain medications and has improved.  You also have large kidney stones in your right kidney with a positive urine culture. Although you don't have symptoms, because of this kidney stone, I have decided to discharge you with continued antibiotics. Please ensure urgent follow-up with a urologist to plan surgery for removal of these stones. You will need to see your PCP for repeat labs to watch your kidney function. Please do not take your candesartan until after your PCP rechecks your labs to ensure your kidney function isn't getting worse.  Please stop your Jardiance for now, until you see your PCP. This medication can increase the risk of urinary tract infection which is higher with your kidney stone.

## 2021-10-26 ENCOUNTER — Other Ambulatory Visit: Payer: Self-pay

## 2021-10-26 ENCOUNTER — Telehealth: Payer: Self-pay

## 2021-10-26 ENCOUNTER — Ambulatory Visit (INDEPENDENT_AMBULATORY_CARE_PROVIDER_SITE_OTHER): Payer: 59 | Admitting: Urology

## 2021-10-26 VITALS — BP 175/98 | HR 73 | Ht 65.0 in | Wt 232.0 lb

## 2021-10-26 DIAGNOSIS — N184 Chronic kidney disease, stage 4 (severe): Secondary | ICD-10-CM

## 2021-10-26 DIAGNOSIS — N200 Calculus of kidney: Secondary | ICD-10-CM | POA: Diagnosis not present

## 2021-10-26 LAB — URINE CULTURE: Culture: >=100000 — AB

## 2021-10-26 NOTE — Telephone Encounter (Signed)
Transition Care Management Follow-up Telephone Call Date of discharge and from where: [place] 10-25-21 Dx: acute pancreatitis How have you been since you were released from the hospital? Doing better Any questions or concerns? No  Items Reviewed: Did the pt receive and understand the discharge instructions provided? Yes  Medications obtained and verified? Yes  Other? No  Any new allergies since your discharge? No  Dietary orders reviewed? Yes Do you have support at home? Yes   Home Care and Equipment/Supplies: Were home health services ordered? no   Has the agency set up a time to come to the patient's home? not applicable Were any new equipment or medical supplies ordered?  No What is the name of the medical supply agency? na Were you able to get the supplies/equipment? not applicable Do you have any questions related to the use of the equipment or supplies? No  Functional Questionnaire: (I = Independent and D = Dependent) ADLs: I  Bathing/Dressing- I  Meal Prep- I  Eating- I  Maintaining continence- I  Transferring/Ambulation- I  Managing Meds- I  Follow up appointments reviewed:  PCP Hospital f/u appt confirmed? Yes  Scheduled to see Dr Carlis Abbott on 10-27-21 @ [facility] f/u appt confirmed? Yes  Scheduled to see CVD on 1-12-232 @.11am Are transportation arrangements needed? No  If their condition worsens, is the pt aware to call PCP or go to the Emergency Dept.? Yes Was the patient provided with contact information for the PCP's office or ED? Yes Was to pt encouraged to call back with questions or concerns? Yes

## 2021-10-26 NOTE — Progress Notes (Signed)
10/26/21 12:35 PM   [name] 07/23/1966 [id number]  CC: Right staghorn kidney stone, recurrent UTIs  HPI: Very comorbid 56 year old female with past medical history notable for diabetes, HIV, CKD(baseline creatinine ~2.7, EGFR 20), and recurrent stone disease.  She was recently admitted at [facility] in [place] for pancreatitis and UTI, and incidentally found to have almost complete right staghorn stone with no evidence of hydronephrosis, no left-sided stones.  She has extensive stone history and was treated with a 4 stage right-sided PCNL by Dr. Tresa Moore in [place] in 2013. NM renal scan at that time showed 33% function of the right kidney.  She reports she was in the hospital for about a month at that time.  She also had left ureteroscopy.  She denies any stones since that episode.  Urine culture at that hospitalization grew Proteus, and she was treated with culture appropriate Cipro.  She denies any right-sided flank pain.  She also has a history of thyroid cancer, sarcoidosis, and breast cancer in remission.  PMH: Past Medical History:  Diagnosis Date   Allergy    Anemia    Normocytic   Anxiety    Asthma    Blood dyscrasia    Bronchitis 2005   CKD (chronic kidney disease)    CLASS 1-EXOPHTHALMOS-THYROTOXIC 02/08/2007   Diabetes mellitus without complication ([place])    Family history of breast cancer    Family history of lung cancer    Family history of prostate cancer    Gastroenteritis 07/10/07   GERD 07/24/2006   GRAVE'S DISEASE 01/01/2008   History of hidradenitis suppurativa    History of kidney stones    History of thrush    HIV DISEASE 07/24/2006   dx March 05   Hyperlipidemia    HYPERTENSION 07/24/2006   Hyperthyroidism 08/2006   Grave's Disease -diffuse radiotracer uptake 08/25/06 Thyroid scan-Cold nodule to R lower lobe of thyrorid   Menometrorrhagia    hx of   Nephrolithiasis    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 Completed 2 yrs Prednisone after lung bx confirmation. No symptoms since then.   Suppurative hidradenitis    Thyroid cancer ([place])    THYROID NODULE, RIGHT 02/08/2007    Surgical History: Past Surgical History:  Procedure Laterality Date   APPLICATION OF WOUND VAC N/A 01/20/2021   Procedure: APPLICATION OF WOUND VAC;  Surgeon: Wallace Going, DO;  Location: [facility];  Service: Plastics;  Laterality: N/A;   BREAST EXCISIONAL BIOPSY Right 04/26/2018   right axilla negative   BREAST EXCISIONAL BIOPSY Left 04/26/2018   left axilla negative   BREAST LUMPECTOMY Right 10/03/2018   malignant   BREAST LUMPECTOMY WITH RADIOACTIVE SEED AND SENTINEL LYMPH NODE BIOPSY Right 10/03/2018   Procedure: RIGHT BREAST LUMPECTOMY WITH RADIOACTIVE SEED AND SENTINEL LYMPH NODE MAPPING;  Surgeon: Erroll Luna, MD;  Location: [facility];  Service: General;  Laterality: Right;   BREAST SURGERY  1997   Breast Reduction    CYSTOSCOPY W/ URETERAL STENT REMOVAL  11/09/2012   Procedure: CYSTOSCOPY WITH STENT REMOVAL;  Surgeon: Alexis Frock,  MD;  Location: [facility];  Service: Urology;  Laterality: Right;   CYSTOSCOPY WITH RETROGRADE PYELOGRAM, URETEROSCOPY AND STENT PLACEMENT  11/09/2012   Procedure: CYSTOSCOPY WITH RETROGRADE PYELOGRAM, URETEROSCOPY AND STENT PLACEMENT;  Surgeon: Alexis Frock, MD;  Location: [facility];  Service: Urology;  Laterality: Left;  LEFT URETEROSCOPY, STONE MANIPULATION, left STENT exchange    CYSTOSCOPY WITH STENT PLACEMENT  10/02/2012   Procedure: CYSTOSCOPY WITH STENT PLACEMENT;  Surgeon: Alexis Frock, MD;  Location: [facility];  Service: Urology;  Laterality: Left;   DEBRIDEMENT AND CLOSURE WOUND N/A 01/20/2021   Procedure: Excision of abdominal wound with closure;  Surgeon: Wallace Going, DO;  Location: [facility];  Service: Plastics;  Laterality: N/A;   DILATION AND CURETTAGE OF UTERUS  Feb 2004   s/p for 1st trimester nonviable pregnancy   EYE SURGERY     sty under eyelid   INCISE AND DRAIN ABCESS  Nov 03   s/p I &D for righ inframmary fold hidradenitis   INCISION AND DRAINAGE PERITONSILLAR ABSCESS  Mar 03   IR CV LINE INJECTION  06/07/2018   IR IMAGING GUIDED PORT INSERTION  06/20/2018   IR REMOVAL TUN ACCESS W/ PORT W/O FL MOD SED  06/20/2018   IRRIGATION AND DEBRIDEMENT ABSCESS  01/31/2012   Procedure: IRRIGATION AND DEBRIDEMENT ABSCESS;  Surgeon: Shann Medal, MD;  [facility];  Service: General;  Laterality: Right;  right breast and axilla    NEPHROLITHOTOMY  10/02/2012   Procedure: NEPHROLITHOTOMY PERCUTANEOUS;  Surgeon: Alexis Frock, MD;  Location[facility];  Service: Urology;  Laterality: Right;  First Stage Percutaneous Nephrolithotomy with Surgeon Access, Left Ureteral Stent     NEPHROLITHOTOMY  10/04/2012   Procedure: NEPHROLITHOTOMY PERCUTANEOUS SECOND LOOK;  Surgeon: Alexis Frock, MD;  [facility];  Service: Urology;  Laterality: Right;      NEPHROLITHOTOMY  10/08/2012   Procedure: NEPHROLITHOTOMY PERCUTANEOUS;  Surgeon: Alexis Frock, MD;  [facility];  Service: Urology;  Laterality: Right;  THIRD STAGE, nephrostomy tube exchange x 2   NEPHROLITHOTOMY  10/11/2012   Procedure: NEPHROLITHOTOMY PERCUTANEOUS SECOND LOOK;  Surgeon: Alexis Frock, MD;  [facility];  Service: Urology;  Laterality: Right;  RIGHT 4 STAGE PERCUTANOUS NEPHROLITHOTOMY, right URETEROSCOPY WITH HOLMIUM LASER    PANNICULECTOMY N/A 12/21/2020   Procedure: PANNICULECTOMY;  Surgeon: Wallace Going, DO;  Location: [facility];  Service: Plastics;  Laterality: N/A;    PORT-A-CATH REMOVAL N/A 07/16/2020   Procedure: REMOVAL PORT-A-CATH;  Surgeon: Erroll Luna, MD;  Location: [facility];  Service: General;  Laterality: N/A;   PORTACATH PLACEMENT Left 05/17/2018   Procedure: INSERTION PORT-A-CATH;  Surgeon: Coralie Keens, MD;  Location: [facility];  Service: General;  Laterality: Left;   RADICAL NECK DISSECTION  04/12/2017   limited/notes 04/12/2017   RADICAL NECK DISSECTION N/A 04/12/2017   Procedure: RADICAL NECK DISSECTION;  Surgeon: Melida Quitter, MD;  Location: [facility];  Service: ENT;  Laterality: N/A;  limited neck dissection 2 hours total   REDUCTION MAMMAPLASTY Bilateral 1998   RIGHT/LEFT HEART CATH AND CORONARY ANGIOGRAPHY N/A 03/12/2020   Procedure: RIGHT/LEFT HEART CATH AND CORONARY ANGIOGRAPHY;  Surgeon: Jolaine Artist, MD;  Location: [facility];  Service: Cardiovascular;  Laterality: N/A;   [place]  04/12/2017   completion/notes 04/12/2017   THYROIDECTOMY N/A 04/12/2017   Procedure: THYROIDECTOMY;  Surgeon: Melida Quitter, MD;  Location: [facility];  Service: ENT;  Laterality: N/A;  Completion Thyroidectomy   TOTAL THYROIDECTOMY  2010     Family History: Family History  Problem Relation Age of Onset   Hypertension Mother    Cancer Mother        laryngeal   Heart disease Mother        stent   Hypertension Father    Lung cancer Father 1       hx smoking   Heart disease Other    Hypertension Other    Stroke Other        Grandparent   Kidney disease Other        Grandparent   Diabetes Other        FH of Diabetes   Hypertension Sister    Cancer Maternal Uncle        Lung CA   Hypertension Brother    Hypertension Sister    Breast cancer Maternal Aunt 65   Breast cancer Paternal Aunt 49   Prostate cancer Paternal Uncle    Breast cancer Maternal Aunt        dx 57+   Breast cancer Paternal Aunt        dx 87's   Breast cancer Paternal Aunt        dx 50's   Prostate  cancer Paternal Uncle    Lung cancer Paternal Uncle    Breast cancer Cousin 72   Breast cancer Cousin        dx <50   Breast cancer Cousin        dx <50   Breast cancer Cousin        dx <50   Colon cancer Neg Hx    Esophageal cancer Neg Hx    Rectal cancer Neg Hx    Stomach cancer Neg Hx     Social History:  reports that she quit smoking about 4 years ago. Her smoking use included cigarettes. She started smoking about 4 years ago. She has a 7.50 pack-year smoking history. She has never used smokeless tobacco. She reports current alcohol use. She reports that she does not use drugs.  Physical Exam: BP (!) 175/98 (BP Location: Left Arm, Patient Position: Sitting, Cuff Size: Large)    Pulse 73    Ht _0  (1.651 m)    Wt 232 lb (105.2 kg)    LMP 03/31/2014 (LMP Unknown)    BMI 38.61 kg/m    Constitutional:  Alert and oriented, No acute distress. Cardiovascular: No clubbing, cyanosis, or edema. Respiratory: Normal respiratory effort, no increased work of breathing. GI: Abdomen is soft, nontender, nondistended, no abdominal masses   Laboratory Data: Reviewed, see HPI  Pertinent Imaging: I have personally viewed and interpreted the CT stone protocol dated 10/22/2021 showing almost a complete right staghorn stone with no evidence of hydronephrosis, no left-sided stones.  Assessment & Plan:   Very complex and comorbid 56 year old female with morbid obesity and BMI of 39, HIV, diabetes, CKD with baseline EGFR of 20, history of nephrolithiasis requiring multiple PCNL on the right side in the past, recently found to have complete right staghorn stone during recent admission for pancreatitis.  She also has recurrent UTIs likely related to her right-sided staghorn stone  We discussed options with her numerous comorbidities and CKD, and I recommended starting with a renal scan to evaluate kidney function.  If renal function is less than 20%, we may want to consider right simple nephrectomy with  her history of recurrent stones in that kidney despite numerous PCNL's previously.  If function greater than 20% can consider PCNL, but we discussed at length this would likely require a multistage procedure, and discussed the risks of bleeding, infection, and recurrence.  NM renal scan to evaluate right renal function, follow-up to discuss results and PCNL versus simple nephrectomy  I spent 65 total minutes on the day of the encounter including pre-visit review of the medical record, face-to-face time with the patient, and post visit ordering of labs/imaging/tests.  Nickolas Madrid, MD 10/26/2021  [facility] [address] [place], [place] [address] [phone]

## 2021-10-27 ENCOUNTER — Encounter: Payer: Self-pay | Admitting: Primary Care

## 2021-10-27 ENCOUNTER — Ambulatory Visit (INDEPENDENT_AMBULATORY_CARE_PROVIDER_SITE_OTHER): Payer: 59 | Admitting: Primary Care

## 2021-10-27 ENCOUNTER — Other Ambulatory Visit: Payer: Self-pay

## 2021-10-27 ENCOUNTER — Encounter: Payer: Self-pay | Admitting: Oncology

## 2021-10-27 VITALS — BP 140/88 | HR 76 | Temp 97.4°F | Ht 65.0 in | Wt 233.0 lb

## 2021-10-27 DIAGNOSIS — E785 Hyperlipidemia, unspecified: Secondary | ICD-10-CM

## 2021-10-27 DIAGNOSIS — E1122 Type 2 diabetes mellitus with diabetic chronic kidney disease: Secondary | ICD-10-CM

## 2021-10-27 DIAGNOSIS — R490 Dysphonia: Secondary | ICD-10-CM

## 2021-10-27 DIAGNOSIS — K859 Acute pancreatitis without necrosis or infection, unspecified: Secondary | ICD-10-CM | POA: Diagnosis not present

## 2021-10-27 DIAGNOSIS — F411 Generalized anxiety disorder: Secondary | ICD-10-CM

## 2021-10-27 DIAGNOSIS — G620 Drug-induced polyneuropathy: Secondary | ICD-10-CM

## 2021-10-27 DIAGNOSIS — T451X5A Adverse effect of antineoplastic and immunosuppressive drugs, initial encounter: Secondary | ICD-10-CM

## 2021-10-27 DIAGNOSIS — N183 Chronic kidney disease, stage 3 unspecified: Secondary | ICD-10-CM

## 2021-10-27 DIAGNOSIS — F331 Major depressive disorder, recurrent, moderate: Secondary | ICD-10-CM

## 2021-10-27 DIAGNOSIS — E119 Type 2 diabetes mellitus without complications: Secondary | ICD-10-CM | POA: Diagnosis not present

## 2021-10-27 DIAGNOSIS — J069 Acute upper respiratory infection, unspecified: Secondary | ICD-10-CM | POA: Diagnosis not present

## 2021-10-27 DIAGNOSIS — E890 Postprocedural hypothyroidism: Secondary | ICD-10-CM

## 2021-10-27 DIAGNOSIS — I10 Essential (primary) hypertension: Secondary | ICD-10-CM

## 2021-10-27 DIAGNOSIS — K219 Gastro-esophageal reflux disease without esophagitis: Secondary | ICD-10-CM

## 2021-10-27 DIAGNOSIS — J4530 Mild persistent asthma, uncomplicated: Secondary | ICD-10-CM

## 2021-10-27 DIAGNOSIS — B20 Human immunodeficiency virus [HIV] disease: Secondary | ICD-10-CM

## 2021-10-27 DIAGNOSIS — R69 Illness, unspecified: Secondary | ICD-10-CM

## 2021-10-27 DIAGNOSIS — C73 Malignant neoplasm of thyroid gland: Secondary | ICD-10-CM

## 2021-10-27 DIAGNOSIS — J309 Allergic rhinitis, unspecified: Secondary | ICD-10-CM

## 2021-10-27 LAB — LIPASE: Lipase: 161 U/L — ABNORMAL HIGH (ref 11.0–59.0)

## 2021-10-27 LAB — BASIC METABOLIC PANEL
BUN: 26 mg/dL — ABNORMAL HIGH (ref 6–23)
CO2: 29 mEq/L (ref 19–32)
Calcium: 9 mg/dL (ref 8.4–10.5)
Chloride: 103 mEq/L (ref 96–112)
Creatinine, Ser: 2.48 mg/dL — ABNORMAL HIGH (ref 0.40–1.20)
GFR: 21.64 mL/min — ABNORMAL LOW (ref >60.00)
Glucose, Bld: 213 mg/dL — ABNORMAL HIGH (ref 70–99)
Potassium: 3.9 mEq/L (ref 3.5–5.1)
Sodium: 138 mEq/L (ref 135–145)

## 2021-10-27 LAB — HEMOGLOBIN A1C: Hgb A1c MFr Bld: 8 % — ABNORMAL HIGH (ref 4.6–6.5)

## 2021-10-27 MED ORDER — ONDANSETRON 4 MG PO TBDP: 4.0000 mg | ORAL_TABLET | Freq: Three times a day (TID) | ORAL | 0 refills | Status: DC | PRN

## 2021-10-27 MED ORDER — CETIRIZINE HCL 10 MG PO TABS: ORAL_TABLET | ORAL | 3 refills | Status: DC

## 2021-10-27 NOTE — Assessment & Plan Note (Signed)
Overall no significant concerns for reflux.  She is managed on both omeprazole 40 mg and famotidine 20 mg, discussed that she does not have to take both medications if she does not feel the need.  She will start by eliminating 1 medication to see if this is enough, she will update.

## 2021-10-27 NOTE — Patient Instructions (Addendum)
Stop by the lab prior to leaving today. I will notify you of your results once received.  We may be able to stop Januvia as this likely contributed to your pancreatitis.  Talk to Dr. Oval Linsey about your blood pressure.  Talk with your attorney regarding my updated letter.  You may take the ondansetron (Zofran) every 8 hours as needed for nausea.   It was a pleasure to see you today!

## 2021-10-27 NOTE — Assessment & Plan Note (Signed)
Recent hospitalization, no abscess or necrosis. Hospital notes, labs, imaging reviewed.  Repeat lipase and BMP pending. Appears to be improving.  Prescription for Zofran provided to use as needed for nausea so that she can increase oral intake of water and advance her diet as tolerated.

## 2021-10-27 NOTE — Assessment & Plan Note (Signed)
Evidence of this is found within her chart under the cytopathology report from March 2018.

## 2021-10-27 NOTE — Assessment & Plan Note (Addendum)
Patient endorses that her thyroid biopsy was negative for cancer.  After patient visit I reviewed her cytopathology reports from [facility] which did reveal metastatic papillary thyroid carcinoma.  We will relay this to patient.  Reviewed ultrasound of thyroid from October 2022. Surveillance will continue.  Continue levothyroxine 112 mcg. Following with endocrinology.

## 2021-10-27 NOTE — Assessment & Plan Note (Signed)
Well-controlled on sertraline 50 mg, continue same.

## 2021-10-27 NOTE — Assessment & Plan Note (Signed)
Following with nephrology, continue to work on blood pressure control.  Repeat BMP pending given recent hospitalization with AKI.

## 2021-10-27 NOTE — Assessment & Plan Note (Signed)
Overall appears well controlled as she is infrequently using Flovent and albuterol.  Discussed that Flovent was a maintenance inhaler and to use consistently if needed.  If no use of Flovent is required, then okay to use albuterol as needed.  She verbalized understanding.

## 2021-10-27 NOTE — Assessment & Plan Note (Signed)
Improved compared to hospital stay, but still uncontrolled.  She will be meeting with her cardiologist tomorrow to discuss her current regimen and neck steps.  Repeat BMP pending today given recent history of AKI.

## 2021-10-27 NOTE — Assessment & Plan Note (Signed)
Significant medical history at the age of 8 with ongoing medical problems at present.  She requires multiple doctors visits sometimes numerous times daily to weekly.  I do not believe that she could return to work at this time or for the foreseeable future.  I agreed to provide documentation stating that her return to work date should be listed as undetermined.  She will reach out to her attorney to see how we can help to support her case.

## 2021-10-27 NOTE — Assessment & Plan Note (Signed)
Compliant to rosuvastatin 10 mg, continue same. Last lipid panel reviewed.

## 2021-10-27 NOTE — Assessment & Plan Note (Signed)
Following with infectious disease.  Continue Descovy 200-25 mg and [name] 50 mg.

## 2021-10-27 NOTE — Assessment & Plan Note (Signed)
Doing well on gabapentin 300 mg as needed, continue same.

## 2021-10-27 NOTE — Progress Notes (Signed)
Subjective:    Patient ID: [name], female    DOB: November 19, 1965, 56 y.o.   MRN: [id number]  HPI  [name] is a very pleasant 56 y.o. female with a significant medical history including type 2 diabetes, thyroid cancer with post surgical hypothyroidism, breast cancer, HIV, asthma, GERD, CKD,  who presents today for [facility] hospital follow up.  1) Hospital Follow Up: She presented to [facility] on 10/22/21 for about 1 week history of left flank and abdominal pain. Actually evaluated at urgent care on 10/17/21, diagnosed with renal stones, and treated with nitrofurantoin 100 mg BID, baclofen, and tamsulosin 0.4 mg. Medications were no help as she developed nausea and vomiting with fevers. ED work up consistent for pancreatitis without abscess/necrosis, and large right sided nephrolithiasis. UA appeared suspicious for cystitis so she was initiated on Rocephin and admitted.  During her admission she experienced AKI with creatinine of 3.05 initially, improved with IV fluids. Symptoms and labs improved so she was discharged home on 10/25/21 with recommendations for repeat BMP, referral to Urology for right sided nephrolithiasis. She was sent home with an Rx for Cipro.   Since her discharge home she has seen urology, last visit was yesterday. Recommendations are to obtain renal scan for evaluation of renal function and to complete either right simple nephrectomy or PCNL. She is now pending renal scan.   She began her ciprofloxacin yesterday as she just picked it up from the pharmacy.  She does still have pain to the left flank.  She's feeling better overall. She does have some nausea, denies vomiting. Is taking Zofran from a family member's prescription with improvement. She is working on oral intake with water and some food. No fevers.   She is questioning where her pancreatitis came from.  She is managed on Januvia 50 mg for diabetes.  2) Chronic Medical Conditions/Disability: She is  really want to get off of some of her medications.  She has an appointment with her cardiologist scheduled for tomorrow and will ask the same question.    Diabetes well controlled as of last visit with A1c of 6.1.  She is managed on Jardiance 10 mg, glipizide 5 mg twice daily, Januvia 50 mg daily for diabetes.  She is interested in trying Trulicity as it helps with her weight.  She endorses that she never had a diagnosis of thyroid cancer despite suspicion for such.  Recent admission for acute pancreatitis.    She has a hearing scheduled for March 6th, 2023 for disability.  Was told by her attorney that our original paperwork from greater than 6 months ago suggested that she could return to work in 4 to 6 months.  She may need an updated letter from [place] stating that this may not be the case.  She continues to follow with nephrology for CKD, was told during her last visit that kidney function had improved and that she was less likely to need dialysis.  She would like her Zyrtec refilled as this helps with her chronic allergies.  She would also like to go through her medication list as she is uncertain why she is taking some medications.  BP Readings from Last 3 Encounters:  10/27/21 140/88  10/26/21 (!) 175/98  10/25/21 (!) 175/89       Review of Systems  Constitutional:  Negative for fever.  Respiratory:  Negative for shortness of breath.   Cardiovascular:  Negative for chest pain.  Gastrointestinal:  Positive for abdominal pain and  nausea. Negative for constipation and vomiting.  Genitourinary:  Positive for flank pain. Negative for hematuria.        Past Medical History:  Diagnosis Date   Allergy    Anemia    Normocytic   Anxiety    Asthma    Blood dyscrasia    Bronchitis 2005   CKD (chronic kidney disease)    CLASS 1-EXOPHTHALMOS-THYROTOXIC 02/08/2007   Diabetes mellitus without complication ([place])    Family history of breast cancer    Family history of lung cancer     Family history of prostate cancer    Gastroenteritis 07/10/07   GERD 07/24/2006   GRAVE'S DISEASE 01/01/2008   History of hidradenitis suppurativa    History of kidney stones    History of thrush    HIV DISEASE 07/24/2006   dx March 05   Hyperlipidemia    HYPERTENSION 07/24/2006   Hyperthyroidism 08/2006   Grave's Disease -diffuse radiotracer uptake 08/25/06 Thyroid scan-Cold nodule to R lower lobe of thyrorid   Menometrorrhagia    hx of   Nephrolithiasis    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   Personal history of chemotherapy    2020   Personal history of radiation therapy    2020   Pneumonia 2005   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   Sarcoidosis 02/08/2007   dx as a teenager in [place] from abnl CXR. Completed 2 yrs Prednisone after lung bx confirmation. No symptoms since then.   Suppurative hidradenitis    Thyroid cancer ([place])    THYROID NODULE, RIGHT 02/08/2007    Social History   Socioeconomic History   Marital status: Single    Spouse name: Not on file   Number of children: 1   Years of education: Not on file   Highest education level: Not on file  Occupational History   Occupation: Secondary school teacher  Tobacco Use   Smoking status: Former    Packs/day: 0.50    Years: 15.00    Pack years: 7.50    Types: Cigarettes    Start date: 04/12/2017    Quit date: 2019    Years since quitting: 4.0   Smokeless tobacco: Never  Vaping Use   Vaping Use: Never used  Substance and Sexual Activity   Alcohol use: Yes    Alcohol/week: 0.0 standard drinks    Comment: social   Drug use: No   Sexual activity: Not Currently    Birth control/protection: Post-menopausal    Comment: declined condoms  Other Topics Concern   Not on file  Social History Narrative   Right Handed    Lives in a  two story home   Social Determinants of Health   Financial Resource Strain: Not on file  Food Insecurity: Not on file  Transportation Needs: Not on file  Physical Activity: Not on file  Stress: Not on file  Social Connections: Not on file  Intimate Partner Violence: Not on file    Past Surgical History:  Procedure Laterality Date   APPLICATION OF WOUND VAC N/A 01/20/2021   Procedure: APPLICATION OF WOUND VAC;  Surgeon: Wallace Going, DO;  Location: [place];  Service: Plastics;  Laterality: N/A;   BREAST EXCISIONAL BIOPSY Right 04/26/2018   right axilla negative   BREAST EXCISIONAL BIOPSY Left 04/26/2018   left axilla negative   BREAST LUMPECTOMY  Right 10/03/2018   malignant   BREAST LUMPECTOMY WITH RADIOACTIVE SEED AND SENTINEL LYMPH NODE BIOPSY Right 10/03/2018   Procedure: RIGHT BREAST LUMPECTOMY WITH RADIOACTIVE SEED AND SENTINEL LYMPH NODE MAPPING;  Surgeon: Erroll Luna, MD;  Location: [place];  Service: General;  Laterality: Right;   BREAST SURGERY  1997   Breast Reduction    CYSTOSCOPY W/ URETERAL STENT REMOVAL  11/09/2012   Procedure: CYSTOSCOPY WITH STENT REMOVAL;  Surgeon: Alexis Frock, MD;  Location: [facility];  Service: Urology;  Laterality: Right;   CYSTOSCOPY WITH RETROGRADE PYELOGRAM, URETEROSCOPY AND STENT PLACEMENT  11/09/2012   Procedure: CYSTOSCOPY WITH RETROGRADE PYELOGRAM, URETEROSCOPY AND STENT PLACEMENT;  Surgeon: Alexis Frock, MD;  Location: [facility];  Service: Urology;  Laterality: Left;  LEFT URETEROSCOPY, STONE MANIPULATION, left STENT exchange    CYSTOSCOPY WITH STENT PLACEMENT  10/02/2012   Procedure: CYSTOSCOPY WITH STENT PLACEMENT;  Surgeon: Alexis Frock, MD;  Location: [facility];  Service: Urology;  Laterality: Left;   DEBRIDEMENT AND CLOSURE WOUND N/A 01/20/2021   Procedure: Excision of abdominal wound with closure;  Surgeon: Wallace Going, DO;  Location: [place];  Service: Plastics;  Laterality: N/A;    DILATION AND CURETTAGE OF UTERUS  Feb 2004   s/p for 1st trimester nonviable pregnancy   EYE SURGERY     sty under eyelid   INCISE AND DRAIN ABCESS  Nov 03   s/p I &D for righ inframmary fold hidradenitis   INCISION AND DRAINAGE PERITONSILLAR ABSCESS  Mar 03   IR CV LINE INJECTION  06/07/2018   IR IMAGING GUIDED PORT INSERTION  06/20/2018   IR REMOVAL TUN ACCESS W/ PORT W/O FL MOD SED  06/20/2018   IRRIGATION AND DEBRIDEMENT ABSCESS  01/31/2012   Procedure: IRRIGATION AND DEBRIDEMENT ABSCESS;  Surgeon: Shann Medal, MD;  Location: [facility];  Service: General;  Laterality: Right;  right breast and axilla    NEPHROLITHOTOMY  10/02/2012   Procedure: NEPHROLITHOTOMY PERCUTANEOUS;  Surgeon: Alexis Frock, MD;  Location: [facility];  Service: Urology;  Laterality: Right;  First Stage Percutaneous Nephrolithotomy with Surgeon Access, Left Ureteral Stent     NEPHROLITHOTOMY  10/04/2012   Procedure: NEPHROLITHOTOMY PERCUTANEOUS SECOND LOOK;  Surgeon: Alexis Frock, MD;  Location[facility];  Service: Urology;  Laterality: Right;      NEPHROLITHOTOMY  10/08/2012   Procedure: NEPHROLITHOTOMY PERCUTANEOUS;  Surgeon: Alexis Frock, MD;  Location[facility];  Service: Urology;  Laterality: Right;  THIRD STAGE, nephrostomy tube exchange x 2   NEPHROLITHOTOMY  10/11/2012   Procedure: NEPHROLITHOTOMY PERCUTANEOUS SECOND LOOK;  Surgeon: Alexis Frock, MD;  Location[facility];  Service: Urology;  Laterality: Right;  RIGHT 4 STAGE PERCUTANOUS NEPHROLITHOTOMY, right URETEROSCOPY WITH HOLMIUM LASER    PANNICULECTOMY N/A 12/21/2020   Procedure: PANNICULECTOMY;  Surgeon: Wallace Going, DO;  Location: [place];  Service: Plastics;  Laterality: N/A;   PORT-A-CATH REMOVAL N/A 07/16/2020   Procedure: REMOVAL PORT-A-CATH;  Surgeon: Erroll Luna, MD;  Location: [place];  Service: General;  Laterality: N/A;   PORTACATH PLACEMENT Left 05/17/2018   Procedure: INSERTION PORT-A-CATH;  Surgeon: Coralie Keens, MD;   Location: [facility];  Service: General;  Laterality: Left;   RADICAL NECK DISSECTION  04/12/2017   limited/notes 04/12/2017   RADICAL NECK DISSECTION N/A 04/12/2017   Procedure: RADICAL NECK DISSECTION;  Surgeon: Melida Quitter, MD;  Location: [facility];  Service: ENT;  Laterality: N/A;  limited neck dissection 2 hours total   REDUCTION  MAMMAPLASTY Bilateral 1998   RIGHT/LEFT HEART CATH AND CORONARY ANGIOGRAPHY N/A 03/12/2020   Procedure: RIGHT/LEFT HEART CATH AND CORONARY ANGIOGRAPHY;  Surgeon: Jolaine Artist, MD;  Location: [facility];  Service: Cardiovascular;  Laterality: N/A;   [place]  1994   THYROIDECTOMY  04/12/2017   completion/notes 04/12/2017   THYROIDECTOMY N/A 04/12/2017   Procedure: THYROIDECTOMY;  Surgeon: Melida Quitter, MD;  Location: [facility];  Service: ENT;  Laterality: N/A;  Completion Thyroidectomy   TOTAL THYROIDECTOMY  2010    Family History  Problem Relation Age of Onset   Hypertension Mother    Cancer Mother        laryngeal   Heart disease Mother        stent   Hypertension Father    Lung cancer Father 39       hx smoking   Heart disease Other    Hypertension Other    Stroke Other        Grandparent   Kidney disease Other        Grandparent   Diabetes Other        FH of Diabetes   Hypertension Sister    Cancer Maternal Uncle        Lung CA   Hypertension Brother    Hypertension Sister    Breast cancer Maternal Aunt 65   Breast cancer Paternal Aunt 67   Prostate cancer Paternal Uncle    Breast cancer Maternal Aunt        dx 60+   Breast cancer Paternal Aunt        dx 7's   Breast cancer Paternal Aunt        dx 50's   Prostate cancer Paternal Uncle    Lung cancer Paternal Uncle    Breast cancer Cousin 15   Breast cancer Cousin        dx <50   Breast cancer Cousin        dx <50   Breast cancer Cousin        dx <50   Colon cancer Neg Hx    Esophageal cancer Neg Hx    Rectal cancer Neg Hx    Stomach cancer Neg Hx      Allergies  Allergen Reactions   Genvoya [Elviteg-Cobic-Emtricit-Tenofaf] Hives   Lisinopril Cough   Tegaderm Ag Mesh [Silver] Itching    Current Outpatient Medications on File Prior to Visit  Medication Sig Dispense Refill   acetaminophen (TYLENOL) 500 MG tablet Take 1,000 mg by mouth every 6 (six) hours as needed for mild pain.     albuterol (VENTOLIN HFA) 108 (90 Base) MCG/ACT inhaler Inhale 2 puffs into the lungs every 6 (six) hours as needed for wheezing or shortness of breath. 8 g 2   allopurinol (ZYLOPRIM) 100 MG tablet TAKE 1 TABLET BY MOUTH DAILY FOR GOUT PREVENTION (Patient taking differently: Take 100 mg by mouth daily.) 90 tablet 0   amLODipine (NORVASC) 5 MG tablet Take 1 tablet (5 mg total) by mouth daily. 30 tablet 6   baclofen (LIORESAL) 10 MG tablet Take 10 mg by mouth 3 (three) times daily as needed for muscle spasms.     Blood Glucose Monitoring Suppl (FREESTYLE LITE) w/Device KIT Inject 1 each into the skin daily. 1 kit 0   candesartan (ATACAND) 32 MG tablet PLEASE SEE YOUR PCP PRIOR TO RESTARTING 30 tablet 11   carvedilol (COREG) 25 MG tablet TAKE 1 TABLET BY MOUTH TWICE A DAY (Patient taking  differently: Take 25 mg by mouth in the morning and at bedtime.) 180 tablet 0   ciprofloxacin (CIPRO) 500 MG tablet Take 1 tablet (500 mg total) by mouth 2 (two) times daily for 4 days. 8 tablet 0   dolutegravir ([name]) 50 MG tablet Take 1 tablet (50 mg total) by mouth daily. 30 tablet 11   empagliflozin (JARDIANCE) 10 MG TABS tablet PLEASE DISCUSS WITH YOUR PCP PRIOR TO RESTARTING 30 tablet 6   emtricitabine-tenofovir AF (DESCOVY) 200-25 MG tablet Take 1 tablet by mouth daily. 30 tablet 11   famotidine (PEPCID) 20 MG tablet TAKE 1 TO 2 TABLETS BY MOUTH AT BEDTIME FOR HOARSE VOICE/REFLUX (Patient taking differently: Take 20 mg by mouth daily.) 180 tablet 0   FLOVENT HFA 110 MCG/ACT inhaler TAKE 1 PUFF BY MOUTH TWICE A DAY (Patient taking differently: Inhale 1 puff into the lungs  2 (two) times daily as needed (wheezing/shortness of breath).) 36 Inhaler 1   fluticasone (FLONASE) 50 MCG/ACT nasal spray Place 2 sprays into both nostrils daily. 16 g 6   gabapentin (NEURONTIN) 300 MG capsule Take 300 mg by mouth daily.     glipiZIDE (GLUCOTROL) 5 MG tablet TAKE 1 TABLET (5 MG TOTAL) BY MOUTH 2 (TWO) TIMES DAILY BEFORE A MEAL. FOR DIABETES. (Patient taking differently: Take 5 mg by mouth in the morning and at bedtime.) 180 tablet 0   glucose blood (ONETOUCH VERIO) test strip USE AS INSTRUCTED TO TEST BLOOD SUGAR DAILY 100 each 2   Lancets (ONETOUCH ULTRASOFT) lancets Use as instructed to test blood sugar daily 100 each 5   levothyroxine (SYNTHROID) 112 MCG tablet TAKE 1 TABLET BY MOUTH EVERY DAY BEFORE BREAKFAST (Patient taking differently: Take 112 mcg by mouth daily.) 30 tablet 0   omeprazole (PRILOSEC) 40 MG capsule TAKE 1 CAPSULE BY MOUTH DAILY FOR REFLUX (Patient taking differently: Take 40 mg by mouth daily.) 90 capsule 0   rosuvastatin (CRESTOR) 10 MG tablet TAKE 1 TABLET BY MOUTH EVERY DAY FOR CHOLESTEROL (Patient taking differently: Take 10 mg by mouth daily.) 90 tablet 3   sitaGLIPtin (JANUVIA) 50 MG tablet Take 1 tablet (50 mg total) by mouth daily. For diabetes. 90 tablet 1   spironolactone (ALDACTONE) 25 MG tablet Take 1 tablet (25 mg total) by mouth daily. 90 tablet 3   tamoxifen (NOLVADEX) 20 MG tablet Take 1 tablet (20 mg total) by mouth daily. 90 tablet 4   oxyCODONE-acetaminophen (PERCOCET) 5-325 MG tablet Take 1 tablet by mouth every 6 (six) hours as needed for severe pain. (Patient not taking: Reported on 10/27/2021) 12 tablet 0   sertraline (ZOLOFT) 50 MG tablet TAKE 1 TABLET (50 MG TOTAL) BY MOUTH DAILY. FOR ANXIETY AND DEPRESSION. (Patient not taking: Reported on 10/27/2021) 90 tablet 0   [DISCONTINUED] prochlorperazine (COMPAZINE) 10 MG tablet TAKE 1 TABLET BY MOUTH EVERY 6 HOURS AS NEEDED FOR NAUSEA OR VOMITING 30 tablet 2   Current Facility-Administered  Medications on File Prior to Visit  Medication Dose Route Frequency Provider Last Rate Last Admin   sodium chloride flush (NS) 0.9 % injection 10 mL  10 mL Intracatheter PRN Magrinat, Virgie Dad, MD   10 mL at 09/19/18 1551    BP 140/88    Pulse 76    Temp (!) 97.4 F (36.3 C) (Temporal)    Ht $R'5\' 5"'DS$  (1.651 m)    Wt 233 lb (105.7 kg)    LMP 03/31/2014 (LMP Unknown)    SpO2 98%    BMI 38.77  kg/m  Objective:   Physical Exam Constitutional:      General: She is not in acute distress.    Appearance: She is not ill-appearing.  Cardiovascular:     Rate and Rhythm: Normal rate and regular rhythm.  Pulmonary:     Effort: Pulmonary effort is normal.     Breath sounds: Normal breath sounds.  Abdominal:     Palpations: Abdomen is soft.     Tenderness: There is no right CVA tenderness or left CVA tenderness.  Skin:    General: Skin is warm and dry.  Psychiatric:        Mood and Affect: Mood normal.          Assessment & Plan:      This visit occurred during the SARS-CoV-2 public health emergency.  Safety protocols were in place, including screening questions prior to the visit, additional usage of staff PPE, and extensive cleaning of exam room while observing appropriate contact time as indicated for disinfecting solutions.

## 2021-10-27 NOTE — Assessment & Plan Note (Signed)
Controlled on sertraline 50 mg, continue same.

## 2021-10-27 NOTE — Assessment & Plan Note (Signed)
Refills provided for Zyrtec 10 mg for which she does well on.

## 2021-10-27 NOTE — Assessment & Plan Note (Signed)
Recently well controlled with A1c of 6.1 during last office visit.  Would consider discontinuing Januvia given recent episode of acute pancreatitis.  Discussed this with patient today.  Continue Jardiance 10 mg, glipizide 5 mg twice daily for now.  Await A1c result.

## 2021-10-27 NOTE — Assessment & Plan Note (Signed)
Improved.  Continue omeprazole 40 mg daily and famotidine 20 mg as needed.

## 2021-10-28 ENCOUNTER — Ambulatory Visit (INDEPENDENT_AMBULATORY_CARE_PROVIDER_SITE_OTHER): Payer: 59 | Admitting: Pharmacist Clinician (PhC)/ Clinical Pharmacy Specialist

## 2021-10-28 ENCOUNTER — Other Ambulatory Visit: Payer: Self-pay | Admitting: Primary Care

## 2021-10-28 ENCOUNTER — Encounter: Payer: Self-pay | Admitting: Pharmacist Clinician (PhC)/ Clinical Pharmacy Specialist

## 2021-10-28 DIAGNOSIS — I10 Essential (primary) hypertension: Secondary | ICD-10-CM

## 2021-10-28 NOTE — Progress Notes (Signed)
10/30/2021 [name] [birth year] [id number]   HPI:  [name] is a 56 y.o. female patient of Dr Oval Linsey, with a PMH below who presents today for advanced hypertension follow up.  She was referred to our clinic by Dr. Haroldine Laws, who sees her for cardio-oncology.  originally seen by Dr. Oval Linsey last fall, with a pressure of 162/102. At that visit she noted that home readings were mostly 081-448'J systolic, however the diastolic was 85-631'S.  She had also just started prednisone for a gout flare at that time.  At follow up visits she continued to have problems with diastolic elevations.  At her last visit with me in August, it was determined that she was taking carvedilol only once daily in the mornings with her other meds.   I saw her last  just after Thanksgiving.  At that time her pressure was still elevated at 166/110, and amlodipine 5 mg was added.    Today she returns for follow up.  Since I saw her last, she was hospitalized for 2 days with pancreatitis.  Her blood pressure at follow up with her PCP yesterday was 140/88 and today in the office is 140/86.  She is tolerating her medications well.  States that when she was with PCP yesterday, it was decided she was on too many medications and she is wanting to get off some.    - Past Medical History: hyperlipidemia 6/22 - LDL 71 on rosuvastatin 10 mg daily  Breast cancer Now on tamoxifen  DM2 8/22 A1c 6.1 on Jardiance 10 mg  CKD3 6/22 SCr 2.74 CrCl with adjusted body weight - 28  hypothroidism 7/22 TSH 20.04 - on levothyroxine 112 mcg (needs f/u with Endo)     Blood Pressure Goal:  130/80  Current Medications: candesartan 32 mg qd, carvedilol 25 mg bid, spironolactone 25 mg qd, amlodipine 5 mg qd  Family Hx: mother with heart disease; both parents and multiple siblings with hypertension, breast cancer in 3 relatives, (both paternal and maternal)  Social Hx: no tobacco, only occasional alcohol; no regular  caffeine intake  Diet: usually eats 2 meals per day, has some intolerances to smells, so avoids restaurants, does not add salt to her foods. Oatmeal for breakfast and sometimes, adds banansa; salad and salmon; hasn't been snacking since hospitla, likes chips; prefers baked chips   Exercise: none  Home BP readings: no readings with her  Intolerances: lisinopril - cough; hctz -gout flares  Labs:   10/27/21:  Na 138, K 3.9, Glu 213, BUN 26, SCr 2.48, GFR 21.6     10/22:  Na 140, K 4.0, Glu 104, SCr 2.72, GFR < 20   Wt Readings from Last 3 Encounters:  10/28/21 238 lb (108 kg)  10/27/21 233 lb (105.7 kg)  10/26/21 232 lb (105.2 kg)   BP Readings from Last 3 Encounters:  10/28/21 140/86  10/27/21 140/88  10/26/21 (!) 175/98   Pulse Readings from Last 3 Encounters:  10/28/21 70  10/27/21 76  10/26/21 73    Current Outpatient Medications  Medication Sig Dispense Refill   acetaminophen (TYLENOL) 500 MG tablet Take 1,000 mg by mouth every 6 (six) hours as needed for mild pain.     albuterol (VENTOLIN HFA) 108 (90 Base) MCG/ACT inhaler Inhale 2 puffs into the lungs every 6 (six) hours as needed for wheezing or shortness of breath. 8 g 2   allopurinol (ZYLOPRIM) 100 MG tablet TAKE 1 TABLET BY MOUTH DAILY FOR GOUT  PREVENTION (Patient taking differently: Take 100 mg by mouth daily.) 90 tablet 0   amLODipine (NORVASC) 5 MG tablet Take 1 tablet (5 mg total) by mouth daily. 30 tablet 6   baclofen (LIORESAL) 10 MG tablet Take 10 mg by mouth 3 (three) times daily as needed for muscle spasms.     Blood Glucose Monitoring Suppl (FREESTYLE LITE) w/Device KIT Inject 1 each into the skin daily. 1 kit 0   candesartan (ATACAND) 32 MG tablet PLEASE SEE YOUR PCP PRIOR TO RESTARTING 30 tablet 11   carvedilol (COREG) 25 MG tablet TAKE 1 TABLET BY MOUTH TWICE A DAY (Patient taking differently: Take 25 mg by mouth in the morning and at bedtime.) 180 tablet 0   cetirizine (ZYRTEC) 10 MG tablet Take 1 tablet  by mouth at bedtime for allergies. 90 tablet 3   ciprofloxacin (CIPRO) 500 MG tablet Take 1 tablet (500 mg total) by mouth 2 (two) times daily for 4 days. 8 tablet 0   dolutegravir ([name]) 50 MG tablet Take 1 tablet (50 mg total) by mouth daily. 30 tablet 11   empagliflozin (JARDIANCE) 10 MG TABS tablet PLEASE DISCUSS WITH YOUR PCP PRIOR TO RESTARTING 30 tablet 6   emtricitabine-tenofovir AF (DESCOVY) 200-25 MG tablet Take 1 tablet by mouth daily. 30 tablet 11   famotidine (PEPCID) 20 MG tablet TAKE 1 TO 2 TABLETS BY MOUTH AT BEDTIME FOR HOARSE VOICE/REFLUX (Patient taking differently: Take 20 mg by mouth daily.) 180 tablet 0   FLOVENT HFA 110 MCG/ACT inhaler TAKE 1 PUFF BY MOUTH TWICE A DAY (Patient taking differently: Inhale 1 puff into the lungs 2 (two) times daily as needed (wheezing/shortness of breath).) 36 Inhaler 1   fluticasone (FLONASE) 50 MCG/ACT nasal spray Place 2 sprays into both nostrils daily. 16 g 6   gabapentin (NEURONTIN) 300 MG capsule Take 300 mg by mouth daily.     glucose blood (ONETOUCH VERIO) test strip USE AS INSTRUCTED TO TEST BLOOD SUGAR DAILY 100 each 2   Lancets (ONETOUCH ULTRASOFT) lancets Use as instructed to test blood sugar daily 100 each 5   levothyroxine (SYNTHROID) 112 MCG tablet TAKE 1 TABLET BY MOUTH EVERY DAY BEFORE BREAKFAST (Patient taking differently: Take 112 mcg by mouth daily.) 30 tablet 0   omeprazole (PRILOSEC) 40 MG capsule TAKE 1 CAPSULE BY MOUTH DAILY FOR REFLUX (Patient taking differently: Take 40 mg by mouth daily.) 90 capsule 0   ondansetron (ZOFRAN-ODT) 4 MG disintegrating tablet Take 1 tablet (4 mg total) by mouth every 8 (eight) hours as needed for nausea or vomiting. 20 tablet 0   rosuvastatin (CRESTOR) 10 MG tablet TAKE 1 TABLET BY MOUTH EVERY DAY FOR CHOLESTEROL (Patient taking differently: Take 10 mg by mouth daily.) 90 tablet 3   sertraline (ZOLOFT) 50 MG tablet TAKE 1 TABLET (50 MG TOTAL) BY MOUTH DAILY. FOR ANXIETY AND DEPRESSION. 90  tablet 0   spironolactone (ALDACTONE) 25 MG tablet Take 1 tablet (25 mg total) by mouth daily. 90 tablet 3   tamoxifen (NOLVADEX) 20 MG tablet Take 1 tablet (20 mg total) by mouth daily. 90 tablet 4   glipiZIDE (GLUCOTROL) 10 MG tablet Take 1 tablet (10 mg total) by mouth 2 (two) times daily. For diabetes. 180 tablet 1   No current facility-administered medications for this visit.   Facility-Administered Medications Ordered in Other Visits  Medication Dose Route Frequency Provider Last Rate Last Admin   sodium chloride flush (NS) 0.9 % injection 10 mL  10 mL Intracatheter  PRN Magrinat, Virgie Dad, MD   10 mL at 09/19/18 1551    Allergies  Allergen Reactions   Genvoya [Elviteg-Cobic-Emtricit-Tenofaf] Hives   Lisinopril Cough   Tegaderm Ag Mesh [Silver] Itching    Past Medical History:  Diagnosis Date   Allergy    Anemia    Normocytic   Anxiety    Asthma    Blood dyscrasia    Bronchitis 2005   Bursitis of left shoulder 09/06/2019   CKD (chronic kidney disease)    CLASS 1-EXOPHTHALMOS-THYROTOXIC 02/08/2007   Diabetes mellitus without complication ([facility])    Family history of breast cancer    Family history of lung cancer    Family history of prostate cancer    Gastroenteritis 07/10/07   GERD 07/24/2006   GRAVE'S DISEASE 01/01/2008   History of hidradenitis suppurativa    History of kidney stones    History of thrush    HIV DISEASE 07/24/2006   dx March 05   Hyperlipidemia    HYPERTENSION 07/24/2006   Hyperthyroidism 08/2006   Grave's Disease -diffuse radiotracer uptake 08/25/06 Thyroid scan-Cold nodule to R lower lobe of thyrorid   Menometrorrhagia    hx of   Nephrolithiasis    Panniculitis 05/12/2020   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 Completed 2 yrs Prednisone after lung bx confirmation. No symptoms since then.   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 rate 14, height _0  (1.651 m), weight 238 lb (108 kg), last menstrual period 03/31/2014, SpO2 98 %.  Essential hypertension Patient with resistant hypertension, now doing better than since we started working with her.  Patient currently frustrated with number of medications she takes daily and would like to reduce this.  Currently on 19 medications that require prescription and 4-5 others that are Rx/OTC.  We had a long discussion on the fact that we finally have her pressure close to goal and ideally should increase amlodipine to 10 mg to hopefully get that last bit of lowering.  Discussed only other options for decreasing medications, which would be lifestyle modifications.  Patient is not interested in PREP/[employer] exercise program, or in exercise in general.  We also discussed weight loss and she is willing to consider working with [facility].  We will put in a referral for that.  Other than that there is nothing we can offer to decrease her medication burden.  Will see her back in 3 months for follow up.     Tommy Medal PharmD CPP [facility] [address] [place], [place] [address] [phone]

## 2021-10-28 NOTE — Patient Instructions (Signed)
Return for a a follow up appointment April 4 at 11 am  Check your blood pressure at home 3-4 times per week and keep record of the readings.  Take your BP meds as follows:  Continue with your current medications  Bring all of your meds, your BP cuff and your record of home blood pressures to your next appointment.  Exercise as youre able, try to walk approximately 30 minutes per day.  Keep salt intake to a minimum, especially watch canned and prepared boxed foods.  Eat more fresh fruits and vegetables and fewer canned items.  Avoid eating in fast food restaurants.    HOW TO TAKE YOUR BLOOD PRESSURE: Rest 5 minutes before taking your blood pressure.  Dont smoke or drink caffeinated beverages for at least 30 minutes before. Take your blood pressure before (not after) you eat. Sit comfortably with your back supported and both feet on the floor (dont cross your legs). Elevate your arm to heart level on a table or a desk. Use the proper sized cuff. It should fit smoothly and snugly around your bare upper arm. There should be enough room to slip a fingertip under the cuff. The bottom edge of the cuff should be 1 inch above the crease of the elbow. Ideally, take 3 measurements at one sitting and record the average.

## 2021-10-29 ENCOUNTER — Other Ambulatory Visit: Payer: Self-pay | Admitting: Primary Care

## 2021-10-29 DIAGNOSIS — E119 Type 2 diabetes mellitus without complications: Secondary | ICD-10-CM

## 2021-10-29 MED ORDER — GLIPIZIDE 10 MG PO TABS: 10.0000 mg | ORAL_TABLET | Freq: Two times a day (BID) | ORAL | 1 refills | Status: DC

## 2021-10-30 ENCOUNTER — Encounter: Payer: Self-pay | Admitting: Pharmacist Clinician (PhC)/ Clinical Pharmacy Specialist

## 2021-10-30 NOTE — Assessment & Plan Note (Signed)
Patient with resistant hypertension, now doing better than since we started working with her.  Patient currently frustrated with number of medications she takes daily and would like to reduce this.  Currently on 19 medications that require prescription and 4-5 others that are Rx/OTC.  We had a long discussion on the fact that we finally have her pressure close to goal and ideally should increase amlodipine to 10 mg to hopefully get that last bit of lowering.  Discussed only other options for decreasing medications, which would be lifestyle modifications.  Patient is not interested in PREP/YM[employer] exercise program, or in exercise in general.  We also discussed weight loss and she is willing to consider working with [facility].  We will put in a referral for that.  Other than that there is nothing we can offer to decrease her medication burden.  Will see her back in 3 months for follow up.

## 2021-11-02 ENCOUNTER — Encounter: Payer: Self-pay | Admitting: Oncology

## 2021-11-02 ENCOUNTER — Ambulatory Visit (INDEPENDENT_AMBULATORY_CARE_PROVIDER_SITE_OTHER): Payer: 59 | Admitting: Endocrinology

## 2021-11-02 ENCOUNTER — Other Ambulatory Visit: Payer: Self-pay

## 2021-11-02 VITALS — BP 136/84 | HR 74 | Ht 65.0 in | Wt 238.0 lb

## 2021-11-02 DIAGNOSIS — C73 Malignant neoplasm of thyroid gland: Secondary | ICD-10-CM | POA: Diagnosis not present

## 2021-11-02 DIAGNOSIS — E890 Postprocedural hypothyroidism: Secondary | ICD-10-CM

## 2021-11-02 LAB — T4, FREE: Free T4: 0.55 ng/dL — ABNORMAL LOW (ref 0.60–1.60)

## 2021-11-02 LAB — TSH: TSH: 54.58 u[IU]/mL — ABNORMAL HIGH (ref 0.35–5.50)

## 2021-11-02 NOTE — Progress Notes (Addendum)
Subjective:    Patient ID: [name], female    DOB: Jun 06, 1966, 56 y.o.   MRN: [id number]  HPI Pt returns for f/u of PTC 2007 thyr NM scan consistent with Graves disease, except for 1 cold area 2008 Dominant 2.5 cm solid nodule in the inferior right lobe, corresponding to cold defect on NM. 2008 thyroid bx was suspicious.  2009 thyroidect--pathol c/w Grave's dz, with adenomatous nodule.  2/18 CT Multiple enlarged bilateral cervical lymph nodes. Residual 10 mm nodule at the thyroidectomy bed with similar nodules superior to the thyroid on the left, concerning for residual or recurrent thyroid tissue. Neoplasm is not excluded.  Single low-density noted in the posterior left level 2 station of undetermined significance. 3/18 LYMPH NODE , FNA, LEFT CERVICAL, STRONGLY FAVORS METASTATIC PTC 3/18 THYROID, FINE NEEDLE ASPIRATION, LEFT BETHESDA CATEGORY II 5/18 NM scan: Expected tissue within the thyroid bed. There is a second adjacent smaller focus of less intense uptake.   6/18 left thyroid lobectomy, and LN's, Left zone 3 Neck Contents.  NO MALIGNANCY.   8/18 NM scan: abnormal focus in the right side of neck, suspicious for nodal metastasis.   9/18 [place]: 1 non pathologically enlarged lymph node.  10/21 [place]: normal (thyroidect) 9/22 US neck LN's  Other: She requests brand name Synthroid.   Interval hx: denies neck swelling or pain.   Past Medical History:  Diagnosis Date   Allergy    Anemia    Normocytic   Anxiety    Asthma    Blood dyscrasia    Bronchitis 2005   Bursitis of left shoulder 09/06/2019   CKD (chronic kidney disease)    CLASS 1-EXOPHTHALMOS-THYROTOXIC 02/08/2007   Diabetes mellitus without complication ([place])    Family history of breast cancer    Family history of lung cancer    Family history of prostate cancer    Gastroenteritis 07/10/07   GERD 07/24/2006   GRAVE'S DISEASE 01/01/2008   History of hidradenitis suppurativa    History of kidney stones    History  of thrush    HIV DISEASE 07/24/2006   dx March 05   Hyperlipidemia    HYPERTENSION 07/24/2006   Hyperthyroidism 08/2006   Grave's Disease -diffuse radiotracer uptake 08/25/06 Thyroid scan-Cold nodule to R lower lobe of thyrorid   Menometrorrhagia    hx of   Nephrolithiasis    Panniculitis 05/12/2020   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 Completed 2 yrs Prednisone after lung bx confirmation. No symptoms since then.   Suppurative hidradenitis    Thyroid cancer ([place])    THYROID NODULE, RIGHT 02/08/2007    Past Surgical History:  Procedure Laterality Date   APPLICATION OF WOUND VAC N/A 01/20/2021   Procedure: APPLICATION OF WOUND VAC;  Surgeon: Wallace Going, DO;  Location: [facility];  Service: Plastics;  Laterality: N/A;   BREAST EXCISIONAL BIOPSY Right 04/26/2018   right axilla negative   BREAST EXCISIONAL BIOPSY Left 04/26/2018   left axilla negative   BREAST LUMPECTOMY Right 10/03/2018   malignant  BREAST LUMPECTOMY WITH RADIOACTIVE SEED AND SENTINEL LYMPH NODE BIOPSY Right 10/03/2018   Procedure: RIGHT BREAST LUMPECTOMY WITH RADIOACTIVE SEED AND SENTINEL LYMPH NODE MAPPING;  Surgeon: Erroll Luna, MD;  Location: [facility];  Service: General;  Laterality: Right;   BREAST SURGERY  1997   Breast Reduction    CYSTOSCOPY W/ URETERAL STENT REMOVAL  11/09/2012   Procedure: CYSTOSCOPY WITH STENT REMOVAL;  Surgeon: Alexis Frock, MD;  Location: [facility];  Service: Urology;  Laterality: Right;   CYSTOSCOPY WITH RETROGRADE PYELOGRAM,  URETEROSCOPY AND STENT PLACEMENT  11/09/2012   Procedure: CYSTOSCOPY WITH RETROGRADE PYELOGRAM, URETEROSCOPY AND STENT PLACEMENT;  Surgeon: Alexis Frock, MD;  Location: [facility];  Service: Urology;  Laterality: Left;  LEFT URETEROSCOPY, STONE MANIPULATION, left STENT exchange    CYSTOSCOPY WITH STENT PLACEMENT  10/02/2012   Procedure: CYSTOSCOPY WITH STENT PLACEMENT;  Surgeon: Alexis Frock, MD;  Location: [facility];  Service: Urology;  Laterality: Left;   DEBRIDEMENT AND CLOSURE WOUND N/A 01/20/2021   Procedure: Excision of abdominal wound with closure;  Surgeon: Wallace Going, DO;  Location: [facility];  Service: Plastics;  Laterality: N/A;   DILATION AND CURETTAGE OF UTERUS  Feb 2004   s/p for 1st trimester nonviable pregnancy   EYE SURGERY     sty under eyelid   INCISE AND DRAIN ABCESS  Nov 03   s/p I &D for righ inframmary fold hidradenitis   INCISION AND DRAINAGE PERITONSILLAR ABSCESS  Mar 03   IR CV LINE INJECTION  06/07/2018   IR IMAGING GUIDED PORT INSERTION  06/20/2018   IR REMOVAL TUN ACCESS W/ PORT W/O FL MOD SED  06/20/2018   IRRIGATION AND DEBRIDEMENT ABSCESS  01/31/2012   Procedure: IRRIGATION AND DEBRIDEMENT ABSCESS;  Surgeon: Shann Medal, MD;  Location[facility];  Service: General;  Laterality: Right;  right breast and axilla    NEPHROLITHOTOMY  10/02/2012   Procedure: NEPHROLITHOTOMY PERCUTANEOUS;  Surgeon: Alexis Frock, MD;  [facility];  Service: Urology;  Laterality: Right;  First Stage Percutaneous Nephrolithotomy with Surgeon Access, Left Ureteral Stent     NEPHROLITHOTOMY  10/04/2012   Procedure: NEPHROLITHOTOMY PERCUTANEOUS SECOND LOOK;  Surgeon: Alexis Frock, MD;  [facility];  Service: Urology;  Laterality: Right;      NEPHROLITHOTOMY  10/08/2012   Procedure: NEPHROLITHOTOMY PERCUTANEOUS;  Surgeon: Alexis Frock, MD;  [facility];  Service: Urology;  Laterality: Right;  THIRD STAGE, nephrostomy tube exchange x 2   NEPHROLITHOTOMY   10/11/2012   Procedure: NEPHROLITHOTOMY PERCUTANEOUS SECOND LOOK;  Surgeon: Alexis Frock, MD;  [facility];  Service: Urology;  Laterality: Right;  RIGHT 4 STAGE PERCUTANOUS NEPHROLITHOTOMY, right URETEROSCOPY WITH HOLMIUM LASER    PANNICULECTOMY N/A 12/21/2020   Procedure: PANNICULECTOMY;  Surgeon: Wallace Going, DO;  Location: [facility];  Service: Plastics;  Laterality: N/A;   PORT-A-CATH REMOVAL N/A 07/16/2020   Procedure: REMOVAL PORT-A-CATH;  Surgeon: Erroll Luna, MD;  Location: [facility];  Service: General;  Laterality: N/A;   PORTACATH PLACEMENT Left 05/17/2018   Procedure: INSERTION PORT-A-CATH;  Surgeon: Coralie Keens, MD;  Location: [facility];  Service: General;  Laterality: Left;   RADICAL NECK DISSECTION  04/12/2017   limited/notes 04/12/2017   RADICAL NECK DISSECTION N/A 04/12/2017   Procedure: RADICAL NECK DISSECTION;  Surgeon: Melida Quitter, MD;  Location: [facility];  Service: ENT;  Laterality: N/A;  limited neck dissection 2 hours total   REDUCTION MAMMAPLASTY Bilateral 1998   RIGHT/LEFT HEART  CATH AND CORONARY ANGIOGRAPHY N/A 03/12/2020   Procedure: RIGHT/LEFT HEART CATH AND CORONARY ANGIOGRAPHY;  Surgeon: Jolaine Artist, MD;  Location: [facility];  Service: Cardiovascular;  Laterality: N/A;   [place]  04/12/2017   completion/notes 04/12/2017   THYROIDECTOMY N/A 04/12/2017   Procedure: THYROIDECTOMY;  Surgeon: Melida Quitter, MD;  Location: [facility];  Service: ENT;  Laterality: N/A;  Completion Thyroidectomy   TOTAL THYROIDECTOMY  2010    Social History   Socioeconomic History   Marital status: Single    Spouse name: Not on file   Number of children: 1   Years of education: Not on file   Highest education level: Not on file  Occupational History   Occupation: Secondary school teacher  Tobacco Use   Smoking status: Former    Packs/day: 0.50    Years: 15.00    Pack years: 7.50    Types: Cigarettes    Start  date: 04/12/2017    Quit date: 2019    Years since quitting: 4.0   Smokeless tobacco: Never  Vaping Use   Vaping Use: Never used  Substance and Sexual Activity   Alcohol use: Yes    Alcohol/week: 0.0 standard drinks    Comment: social   Drug use: No   Sexual activity: Not Currently    Birth control/protection: Post-menopausal    Comment: declined condoms  Other Topics Concern   Not on file  Social History Narrative   Right Handed    Lives in a two story home   Social Determinants of Health   Financial Resource Strain: Not on file  Food Insecurity: Not on file  Transportation Needs: Not on file  Physical Activity: Not on file  Stress: Not on file  Social Connections: Not on file  Intimate Partner Violence: Not on file    Current Outpatient Medications on File Prior to Visit  Medication Sig Dispense Refill   acetaminophen (TYLENOL) 500 MG tablet Take 1,000 mg by mouth every 6 (six) hours as needed for mild pain.     albuterol (VENTOLIN HFA) 108 (90 Base) MCG/ACT inhaler Inhale 2 puffs into the lungs every 6 (six) hours as needed for wheezing or shortness of breath. 8 g 2   allopurinol (ZYLOPRIM) 100 MG tablet TAKE 1 TABLET BY MOUTH DAILY FOR GOUT PREVENTION (Patient taking differently: Take 100 mg by mouth daily.) 90 tablet 0   amLODipine (NORVASC) 5 MG tablet Take 1 tablet (5 mg total) by mouth daily. 30 tablet 6   baclofen (LIORESAL) 10 MG tablet Take 10 mg by mouth 3 (three) times daily as needed for muscle spasms.     Blood Glucose Monitoring Suppl (FREESTYLE LITE) w/Device KIT Inject 1 each into the skin daily. 1 kit 0   candesartan (ATACAND) 32 MG tablet PLEASE SEE YOUR PCP PRIOR TO RESTARTING 30 tablet 11   carvedilol (COREG) 25 MG tablet TAKE 1 TABLET BY MOUTH TWICE A DAY (Patient taking differently: Take 25 mg by mouth in the morning and at bedtime.) 180 tablet 0   cetirizine (ZYRTEC) 10 MG tablet Take 1 tablet by mouth at bedtime for allergies. 90 tablet 3    dolutegravir ([name]) 50 MG tablet Take 1 tablet (50 mg total) by mouth daily. 30 tablet 11   empagliflozin (JARDIANCE) 10 MG TABS tablet PLEASE DISCUSS WITH YOUR PCP PRIOR TO RESTARTING 30 tablet 6   emtricitabine-tenofovir AF (DESCOVY) 200-25 MG tablet Take 1 tablet by mouth daily. 30 tablet 11  famotidine (PEPCID) 20 MG tablet TAKE 1 TO 2 TABLETS BY MOUTH AT BEDTIME FOR HOARSE VOICE/REFLUX (Patient taking differently: Take 20 mg by mouth daily.) 180 tablet 0   FLOVENT HFA 110 MCG/ACT inhaler TAKE 1 PUFF BY MOUTH TWICE A DAY (Patient taking differently: Inhale 1 puff into the lungs 2 (two) times daily as needed (wheezing/shortness of breath).) 36 Inhaler 1   fluticasone (FLONASE) 50 MCG/ACT nasal spray Place 2 sprays into both nostrils daily. 16 g 6   gabapentin (NEURONTIN) 300 MG capsule Take 300 mg by mouth daily.     glipiZIDE (GLUCOTROL) 10 MG tablet Take 1 tablet (10 mg total) by mouth 2 (two) times daily. For diabetes. 180 tablet 1   glucose blood (ONETOUCH VERIO) test strip USE AS INSTRUCTED TO TEST BLOOD SUGAR DAILY 100 each 2   Lancets (ONETOUCH ULTRASOFT) lancets Use as instructed to test blood sugar daily 100 each 5   omeprazole (PRILOSEC) 40 MG capsule TAKE 1 CAPSULE BY MOUTH DAILY FOR REFLUX (Patient taking differently: Take 40 mg by mouth daily.) 90 capsule 0   ondansetron (ZOFRAN-ODT) 4 MG disintegrating tablet Take 1 tablet (4 mg total) by mouth every 8 (eight) hours as needed for nausea or vomiting. 20 tablet 0   rosuvastatin (CRESTOR) 10 MG tablet TAKE 1 TABLET BY MOUTH EVERY DAY FOR CHOLESTEROL (Patient taking differently: Take 10 mg by mouth daily.) 90 tablet 3   sertraline (ZOLOFT) 50 MG tablet TAKE 1 TABLET (50 MG TOTAL) BY MOUTH DAILY. FOR ANXIETY AND DEPRESSION. 90 tablet 0   spironolactone (ALDACTONE) 25 MG tablet Take 1 tablet (25 mg total) by mouth daily. 90 tablet 3   tamoxifen (NOLVADEX) 20 MG tablet Take 1 tablet (20 mg total) by mouth daily. 90 tablet 4    [DISCONTINUED] prochlorperazine (COMPAZINE) 10 MG tablet TAKE 1 TABLET BY MOUTH EVERY 6 HOURS AS NEEDED FOR NAUSEA OR VOMITING 30 tablet 2   Current Facility-Administered Medications on File Prior to Visit  Medication Dose Route Frequency Provider Last Rate Last Admin   sodium chloride flush (NS) 0.9 % injection 10 mL  10 mL Intracatheter PRN Magrinat, Virgie Dad, MD   10 mL at 09/19/18 1551    Allergies  Allergen Reactions   Genvoya [Elviteg-Cobic-Emtricit-Tenofaf] Hives   Lisinopril Cough   Tegaderm Ag Mesh [Silver] Itching    Family History  Problem Relation Age of Onset   Hypertension Mother    Cancer Mother        laryngeal   Heart disease Mother        stent   Hypertension Father    Lung cancer Father 25       hx smoking   Heart disease Other    Hypertension Other    Stroke Other        Grandparent   Kidney disease Other        Grandparent   Diabetes Other        FH of Diabetes   Hypertension Sister    Cancer Maternal Uncle        Lung CA   Hypertension Brother    Hypertension Sister    Breast cancer Maternal Aunt 65   Breast cancer Paternal Aunt 1   Prostate cancer Paternal Uncle    Breast cancer Maternal Aunt        dx 60+   Breast cancer Paternal Aunt        dx 57's   Breast cancer Paternal 57  dx 50's   Prostate cancer Paternal Uncle    Lung cancer Paternal Uncle    Breast cancer Cousin 64   Breast cancer Cousin        dx <50   Breast cancer Cousin        dx <50   Breast cancer Cousin        dx <50   Colon cancer Neg Hx    Esophageal cancer Neg Hx    Rectal cancer Neg Hx    Stomach cancer Neg Hx     BP 136/84    Pulse 74    Ht $R'5\' 5"'ux$  (1.651 m)    Wt 238 lb (108 kg)    LMP 03/31/2014 (LMP Unknown)    SpO2 96%    BMI 39.61 kg/m     Review of Systems     Objective:   Physical Exam Neck: a healed scar is present.  I do not appreciate a nodule in the thyroid or elsewhere in the neck.    Lab Results  Component Value Date   TSH 54.58  (H) 11/02/2021      Assessment & Plan:  Hypothyroidism: uncontrolled.  increase the brand name synthroid to 2x125 mcg per day

## 2021-11-02 NOTE — Patient Instructions (Signed)
Blood tests are requested for you today.  We'll let you know about the results.  Let's recheck the ultrasound.  you will receive a phone call, about a day and time for an appointment. It is best to never miss the medication.  However, if you do miss it, next best is to double up the next time.   Please come back for a follow-up appointment in 4 months.

## 2021-11-03 MED ORDER — LEVOTHYROXINE SODIUM 125 MCG PO TABS: 250.0000 ug | ORAL_TABLET | Freq: Every day | ORAL | 3 refills | Status: DC

## 2021-11-08 ENCOUNTER — Other Ambulatory Visit: Payer: Self-pay | Admitting: Endocrinology

## 2021-11-10 ENCOUNTER — Other Ambulatory Visit: Payer: Self-pay | Admitting: Primary Care

## 2021-11-10 DIAGNOSIS — F411 Generalized anxiety disorder: Secondary | ICD-10-CM

## 2021-11-10 DIAGNOSIS — K219 Gastro-esophageal reflux disease without esophagitis: Secondary | ICD-10-CM

## 2021-11-10 DIAGNOSIS — F331 Major depressive disorder, recurrent, moderate: Secondary | ICD-10-CM

## 2021-11-10 DIAGNOSIS — M1A09X0 Idiopathic chronic gout, multiple sites, without tophus (tophi): Secondary | ICD-10-CM

## 2021-11-10 DIAGNOSIS — E119 Type 2 diabetes mellitus without complications: Secondary | ICD-10-CM

## 2021-11-10 DIAGNOSIS — R490 Dysphonia: Secondary | ICD-10-CM

## 2021-11-12 ENCOUNTER — Other Ambulatory Visit: Payer: Self-pay | Admitting: Internal Medicine

## 2021-11-12 ENCOUNTER — Telehealth: Payer: Self-pay | Admitting: Primary Care

## 2021-11-12 ENCOUNTER — Other Ambulatory Visit (HOSPITAL_COMMUNITY): Payer: Self-pay | Admitting: Internal Medicine

## 2021-11-12 DIAGNOSIS — L732 Hidradenitis suppurativa: Secondary | ICD-10-CM

## 2021-11-12 DIAGNOSIS — E119 Type 2 diabetes mellitus without complications: Secondary | ICD-10-CM

## 2021-11-12 MED ORDER — ONETOUCH VERIO VI STRP: ORAL_STRIP | 3 refills | Status: DC

## 2021-11-12 NOTE — Telephone Encounter (Signed)
°  Encourage patient to contact the pharmacy for refills or they can request refills through [facility]:  Please schedule appointment if longer than 1 year  NEXT APPOINTMENT DATE:01/27/22  MEDICATION:spironolactone (ALDACTONE) 25 MG tablet,glucose blood (ONETOUCH VERIO) test strip  Is the patient out of medication?   PHARMACY:[id number] [place], [place] [facility] [address]  Let patient know to contact pharmacy at the end of the day to make sure medication is ready.  Please notify patient to allow 48-72 hours to process  CLINICAL FILLS OUT ALL BELOW:   LAST REFILL:  QTY:  REFILL DATE:    OTHER COMMENTS:    Okay for refill?  Please advise

## 2021-11-12 NOTE — Telephone Encounter (Signed)
Left message to return call to our office.  

## 2021-11-12 NOTE — Telephone Encounter (Signed)
Since spironolactone refill request to cardiologist.

## 2021-11-17 ENCOUNTER — Telehealth (HOSPITAL_BASED_OUTPATIENT_CLINIC_OR_DEPARTMENT_OTHER): Payer: Self-pay | Admitting: Cardiovascular Disease

## 2021-11-17 ENCOUNTER — Encounter: Payer: Self-pay | Admitting: Oncology

## 2021-11-17 ENCOUNTER — Other Ambulatory Visit (HOSPITAL_BASED_OUTPATIENT_CLINIC_OR_DEPARTMENT_OTHER): Payer: Self-pay

## 2021-11-17 MED ORDER — SPIRONOLACTONE 25 MG PO TABS: 25.0000 mg | ORAL_TABLET | Freq: Every day | ORAL | 3 refills | Status: DC

## 2021-11-17 NOTE — Telephone Encounter (Signed)
°*  STAT* If patient is at the pharmacy, call can be transferred to refill team.   1. Which medications need to be refilled? (please list name of each medication and dose if known) Spironolactone  2. Which pharmacy/location (including street and city if local pharmacy) is medication to be sent to? [facility] [address] [place], [place]  3. Do they need a 30 day or 90 day supply? 90 days and refills

## 2021-11-17 NOTE — Telephone Encounter (Signed)
Patient called informed that she will need to call cardiology for refill on spironolactone and that the test strips have been called in.   States she needs letter of support for disability. Needs to have listed what she is being treated for and why you support her not working and why she should not be working. Patient is fine with having put in my chart and would like call to know it is done. She has been informed that it will be later next week once you are back in the office.

## 2021-11-23 NOTE — Telephone Encounter (Signed)
Please notify patient that the note has been sent to her My Chart portal.

## 2021-11-23 NOTE — Telephone Encounter (Signed)
Called patient let know that letter can be printed from her my chart. Will call if any questions.

## 2021-11-25 ENCOUNTER — Ambulatory Visit: Payer: 59 | Admitting: Urology

## 2021-11-26 ENCOUNTER — Ambulatory Visit (INDEPENDENT_AMBULATORY_CARE_PROVIDER_SITE_OTHER): Payer: Managed Care, Other (non HMO) | Admitting: Nurse Practitioner

## 2021-11-26 ENCOUNTER — Other Ambulatory Visit: Payer: Self-pay

## 2021-11-26 VITALS — BP 132/100 | HR 65 | Temp 98.0°F | Resp 16 | Ht 65.0 in | Wt 233.1 lb

## 2021-11-26 DIAGNOSIS — E119 Type 2 diabetes mellitus without complications: Secondary | ICD-10-CM

## 2021-11-26 DIAGNOSIS — L299 Pruritus, unspecified: Secondary | ICD-10-CM | POA: Diagnosis not present

## 2021-11-26 DIAGNOSIS — R21 Rash and other nonspecific skin eruption: Secondary | ICD-10-CM

## 2021-11-26 LAB — GLUCOSE, POCT (MANUAL RESULT ENTRY): POC Glucose: 266 mg/dl — AB (ref 70–99)

## 2021-11-26 MED ORDER — HYDROXYZINE HCL 10 MG PO TABS: 10.0000 mg | ORAL_TABLET | Freq: Two times a day (BID) | ORAL | 0 refills | Status: DC | PRN

## 2021-11-26 MED ORDER — METHYLPREDNISOLONE ACETATE 40 MG/ML IJ SUSP
40.0000 mg | Freq: Once | INTRAMUSCULAR | Status: AC
  Administered 2021-11-26: 40 mg via INTRAMUSCULAR

## 2021-11-26 NOTE — Assessment & Plan Note (Signed)
Urticarial widespread rash.  Been going on for several days.  Patient was concerned that this medication induced.  Patient's been on medications for extended period of time does not always adherent to help that should be taken.  No airway distress.  We will give Depo-Medrol 40 mg IM x1 dose in office today.  Given patient is currently uncontrolled diabetic we will hold off on sending him any more steroids.  Told her to monitor her sugars very closely

## 2021-11-26 NOTE — Assessment & Plan Note (Signed)
Patient recently taken her antidiabetic medication prior office.  Patient states she also is eating on the way to her appointment.  Likely the reason her glucose was 266.  She is to continue to monitor it closely at home given steroid injection.

## 2021-11-26 NOTE — Progress Notes (Signed)
Acute Office Visit  Subjective:    Patient ID: [name], female    DOB: 01/21/1966, 56 y.o.   MRN: [id number]  Chief Complaint  Patient presents with   Rash    At first on 11/23/21 noticed a couple of bites on her leg, then on 11/24/21 patient noticed break out on the chest, whelps, on her legs and arms and face now. Has tried Zyrtec and used Vitamin E oil/lotion     Patient is in today for Rash  States that she noticed a couple of bumps on the back of her left leg and right arm on Tuesday States on Wednesday night started itching and noticed whelps that developed that night into Thursday morning Has used zyrtec and vitamin E oil, without good relief  States that she started back on her sprinilactone and started taking her amlodipine more regularly this past week. the rash started after that. She has been on sprinilactone for an extended period of time. Had a period where she went off it but started it back recently. Has been on amlodipine for an extended period of time but just recently started taking it more regularly  Past Medical History:  Diagnosis Date   Allergy    Anemia    Normocytic   Anxiety    Asthma    Blood dyscrasia    Bronchitis 2005   Bursitis of left shoulder 09/06/2019   CKD (chronic kidney disease)    CLASS 1-EXOPHTHALMOS-THYROTOXIC 02/08/2007   Diabetes mellitus without complication (HCC)    Family history of breast cancer    Family history of lung cancer    Family history of prostate cancer    Gastroenteritis 07/10/07   GERD 07/24/2006   GRAVE'S DISEASE 01/01/2008   History of hidradenitis suppurativa    History of kidney stones    History of thrush    HIV DISEASE 07/24/2006   dx March 05   Hyperlipidemia    HYPERTENSION 07/24/2006   Hyperthyroidism 08/2006   Grave's Disease -diffuse radiotracer uptake 08/25/06 Thyroid scan-Cold nodule to R lower lobe of thyrorid   Menometrorrhagia    hx of   Nephrolithiasis    Panniculitis 05/12/2020    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HCC)    METASTATIC PAPILLARY THYROID CARCINOMA per 01/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 Completed 2 yrs Prednisone after lung bx confirmation. No symptoms since then.   Suppurative hidradenitis    Thyroid cancer (HCC)    THYROID NODULE, RIGHT 02/08/2007    Past Surgical History:  Procedure Laterality Date   APPLICATION OF WOUND VAC N/A 01/20/2021   Procedure: APPLICATION OF WOUND VAC;  Surgeon: Peggye Form, DO;  Location: [facility];  Service: Plastics;  Laterality: N/A;   BREAST EXCISIONAL BIOPSY Right 04/26/2018   right axilla negative   BREAST EXCISIONAL BIOPSY Left 04/26/2018   left axilla negative   BREAST LUMPECTOMY Right 10/03/2018   malignant   BREAST LUMPECTOMY WITH RADIOACTIVE SEED AND SENTINEL LYMPH NODE BIOPSY Right 10/03/2018   Procedure: RIGHT BREAST LUMPECTOMY WITH RADIOACTIVE SEED AND SENTINEL LYMPH NODE MAPPING;  Surgeon: Harriette Bouillon, MD;  Location:  [facility];  Service: General;  Laterality: Right;   BREAST SURGERY  1997   Breast Reduction    CYSTOSCOPY W/ URETERAL STENT REMOVAL  11/09/2012   Procedure: CYSTOSCOPY WITH STENT REMOVAL;  Surgeon: Alexis Frock, MD;  Location: [facility];  Service: Urology;  Laterality: Right;   CYSTOSCOPY WITH RETROGRADE PYELOGRAM, URETEROSCOPY AND STENT PLACEMENT  11/09/2012   Procedure: CYSTOSCOPY WITH RETROGRADE PYELOGRAM, URETEROSCOPY AND STENT PLACEMENT;  Surgeon: Alexis Frock, MD;  Location: [facility];  Service: Urology;  Laterality: Left;  LEFT URETEROSCOPY, STONE MANIPULATION, left STENT exchange    CYSTOSCOPY WITH STENT PLACEMENT   10/02/2012   Procedure: CYSTOSCOPY WITH STENT PLACEMENT;  Surgeon: Alexis Frock, MD;  Location: [facility];  Service: Urology;  Laterality: Left;   DEBRIDEMENT AND CLOSURE WOUND N/A 01/20/2021   Procedure: Excision of abdominal wound with closure;  Surgeon: Wallace Going, DO;  Location: [facility];  Service: Plastics;  Laterality: N/A;   DILATION AND CURETTAGE OF UTERUS  Feb 2004   s/p for 1st trimester nonviable pregnancy   EYE SURGERY     sty under eyelid   INCISE AND DRAIN ABCESS  Nov 03   s/p I &D for righ inframmary fold hidradenitis   INCISION AND DRAINAGE PERITONSILLAR ABSCESS  Mar 03   IR CV LINE INJECTION  06/07/2018   IR IMAGING GUIDED PORT INSERTION  06/20/2018   IR REMOVAL TUN ACCESS W/ PORT W/O FL MOD SED  06/20/2018   IRRIGATION AND DEBRIDEMENT ABSCESS  01/31/2012   Procedure: IRRIGATION AND DEBRIDEMENT ABSCESS;  Surgeon: Shann Medal, MD;  Location: [facility];  Service: General;  Laterality: Right;  right breast and axilla    NEPHROLITHOTOMY  10/02/2012   Procedure: NEPHROLITHOTOMY PERCUTANEOUS;  Surgeon: Alexis Frock, MD;  Location: [facility];  Service: Urology;  Laterality: Right;  First Stage Percutaneous Nephrolithotomy with Surgeon Access, Left Ureteral Stent     NEPHROLITHOTOMY  10/04/2012   Procedure: NEPHROLITHOTOMY PERCUTANEOUS SECOND LOOK;  Surgeon: Alexis Frock, MD;  Location: [facility];  Service: Urology;  Laterality: Right;      NEPHROLITHOTOMY  10/08/2012   Procedure: NEPHROLITHOTOMY PERCUTANEOUS;  Surgeon: Alexis Frock, MD;  Location: [facility];  Service: Urology;  Laterality: Right;  THIRD STAGE, nephrostomy tube exchange x 2   NEPHROLITHOTOMY  10/11/2012   Procedure: NEPHROLITHOTOMY PERCUTANEOUS SECOND LOOK;  Surgeon: Alexis Frock, MD;  Location: [facility];  Service: Urology;  Laterality: Right;  RIGHT 4 STAGE PERCUTANOUS NEPHROLITHOTOMY, right URETEROSCOPY WITH HOLMIUM LASER    PANNICULECTOMY N/A 12/21/2020   Procedure: PANNICULECTOMY;  Surgeon:  Wallace Going, DO;  Location: [facility];  Service: Plastics;  Laterality: N/A;   PORT-A-CATH REMOVAL N/A 07/16/2020   Procedure: REMOVAL PORT-A-CATH;  Surgeon: Erroll Luna, MD;  Location: [facility];  Service: General;  Laterality: N/A;   PORTACATH PLACEMENT Left 05/17/2018   Procedure: INSERTION PORT-A-CATH;  Surgeon: Coralie Keens, MD;  Location: [facility];  Service: General;  Laterality: Left;   RADICAL NECK DISSECTION  04/12/2017   limited/notes 04/12/2017   RADICAL NECK DISSECTION N/A 04/12/2017   Procedure: RADICAL NECK DISSECTION;  Surgeon: Melida Quitter, MD;  Location: [facility];  Service: ENT;  Laterality: N/A;  limited neck dissection 2 hours total   REDUCTION MAMMAPLASTY Bilateral 1998   RIGHT/LEFT HEART CATH AND CORONARY ANGIOGRAPHY N/A 03/12/2020   Procedure: RIGHT/LEFT HEART CATH AND CORONARY ANGIOGRAPHY;  Surgeon: Jolaine Artist, MD;  Location: [facility];  Service: Cardiovascular;  Laterality: N/A;  [place]  1994   THYROIDECTOMY  04/12/2017   completion/notes 04/12/2017   THYROIDECTOMY N/A 04/12/2017   Procedure: THYROIDECTOMY;  Surgeon: Melida Quitter, MD;  Location: [facility];  Service: ENT;  Laterality: N/A;  Completion Thyroidectomy   TOTAL THYROIDECTOMY  2010    Family History  Problem Relation Age of Onset   Hypertension Mother    Cancer Mother        laryngeal   Heart disease Mother        stent   Hypertension Father    Lung cancer Father 42       hx smoking   Heart disease Other    Hypertension Other    Stroke Other        Grandparent   Kidney disease Other        Grandparent   Diabetes Other        FH of Diabetes   Hypertension Sister    Cancer Maternal Uncle        Lung CA   Hypertension Brother    Hypertension Sister    Breast cancer Maternal Aunt 65   Breast cancer Paternal Aunt 58   Prostate cancer Paternal Uncle    Breast cancer Maternal Aunt        dx 64+   Breast cancer Paternal Aunt        dx 44's    Breast cancer Paternal 67        dx 50's   Prostate cancer Paternal Uncle    Lung cancer Paternal Uncle    Breast cancer Cousin 67   Breast cancer Cousin        dx <50   Breast cancer Cousin        dx <50   Breast cancer Cousin        dx <50   Colon cancer Neg Hx    Esophageal cancer Neg Hx    Rectal cancer Neg Hx    Stomach cancer Neg Hx     Social History   Socioeconomic History   Marital status: Single    Spouse name: Not on file   Number of children: 1   Years of education: Not on file   Highest education level: Not on file  Occupational History   Occupation: Secondary school teacher  Tobacco Use   Smoking status: Former    Packs/day: 0.50    Years: 15.00    Pack years: 7.50    Types: Cigarettes    Start date: 04/12/2017    Quit date: 2019    Years since quitting: 4.1   Smokeless tobacco: Never  Vaping Use   Vaping Use: Never used  Substance and Sexual Activity   Alcohol use: Yes    Alcohol/week: 0.0 standard drinks    Comment: social   Drug use: No   Sexual activity: Not Currently    Birth control/protection: Post-menopausal    Comment: declined condoms  Other Topics Concern   Not on file  Social History Narrative   Right Handed    Lives in a two story home   Social Determinants of Health   Financial Resource Strain: Not on file  Food Insecurity: Not on file  Transportation Needs: Not on file  Physical Activity: Not on file  Stress: Not on file  Social Connections: Not on file  Intimate Partner Violence: Not on file    Outpatient Medications Prior to Visit  Medication Sig Dispense Refill   acetaminophen (TYLENOL) 500 MG tablet Take 1,000 mg by  mouth every 6 (six) hours as needed for mild pain.     albuterol (VENTOLIN HFA) 108 (90 Base) MCG/ACT inhaler Inhale 2 puffs into the lungs every 6 (six) hours as needed for wheezing or shortness of breath. 8 g 2   allopurinol (ZYLOPRIM) 100 MG tablet TAKE 1 TABLET BY MOUTH EVERY DAY FOR GOUT PREVENTION 90  tablet 1   amLODipine (NORVASC) 5 MG tablet Take 1 tablet (5 mg total) by mouth daily. 30 tablet 6   baclofen (LIORESAL) 10 MG tablet Take 10 mg by mouth 3 (three) times daily as needed for muscle spasms.     Blood Glucose Monitoring Suppl (FREESTYLE LITE) w/Device KIT Inject 1 each into the skin daily. 1 kit 0   candesartan (ATACAND) 32 MG tablet PLEASE SEE YOUR PCP PRIOR TO RESTARTING 30 tablet 11   carvedilol (COREG) 25 MG tablet TAKE 1 TABLET BY MOUTH TWICE A DAY (Patient taking differently: Take 25 mg by mouth in the morning and at bedtime.) 180 tablet 0   cetirizine (ZYRTEC) 10 MG tablet Take 1 tablet by mouth at bedtime for allergies. 90 tablet 3   dolutegravir ([name]) 50 MG tablet Take 1 tablet (50 mg total) by mouth daily. 30 tablet 11   emtricitabine-tenofovir AF (DESCOVY) 200-25 MG tablet Take 1 tablet by mouth daily. 30 tablet 11   FLOVENT HFA 110 MCG/ACT inhaler TAKE 1 PUFF BY MOUTH TWICE A DAY (Patient taking differently: Inhale 1 puff into the lungs 2 (two) times daily as needed (wheezing/shortness of breath).) 36 Inhaler 1   fluticasone (FLONASE) 50 MCG/ACT nasal spray Place 2 sprays into both nostrils daily. 16 g 6   gabapentin (NEURONTIN) 300 MG capsule Take 300 mg by mouth daily.     glipiZIDE (GLUCOTROL) 10 MG tablet Take 1 tablet (10 mg total) by mouth 2 (two) times daily. For diabetes. 180 tablet 1   glucose blood (ONETOUCH VERIO) test strip USE AS INSTRUCTED TO TEST BLOOD SUGAR up to 3 times daily. 300 each 3   Lancets (ONETOUCH ULTRASOFT) lancets Use as instructed to test blood sugar daily 100 each 5   levothyroxine (SYNTHROID) 125 MCG tablet Take 2 tablets (250 mcg total) by mouth daily. 180 tablet 3   ondansetron (ZOFRAN-ODT) 4 MG disintegrating tablet Take 1 tablet (4 mg total) by mouth every 8 (eight) hours as needed for nausea or vomiting. 20 tablet 0   rosuvastatin (CRESTOR) 10 MG tablet TAKE 1 TABLET BY MOUTH EVERY DAY FOR CHOLESTEROL (Patient taking differently:  Take 10 mg by mouth daily.) 90 tablet 3   sertraline (ZOLOFT) 50 MG tablet TAKE 1 TABLET (50 MG TOTAL) BY MOUTH DAILY. FOR ANXIETY AND DEPRESSION. 90 tablet 3   spironolactone (ALDACTONE) 25 MG tablet Take 1 tablet (25 mg total) by mouth daily. 90 tablet 3   tamoxifen (NOLVADEX) 20 MG tablet Take 1 tablet (20 mg total) by mouth daily. 90 tablet 4   famotidine (PEPCID) 20 MG tablet TAKE 1 TO 2 TABLETS BY MOUTH AT BEDTIME FOR HOARSE VOICE/REFLUX 180 tablet 3   omeprazole (PRILOSEC) 40 MG capsule TAKE 1 CAPSULE BY MOUTH DAILY FOR REFLUX 90 capsule 3   empagliflozin (JARDIANCE) 10 MG TABS tablet PLEASE DISCUSS WITH YOUR PCP PRIOR TO RESTARTING 30 tablet 6   Facility-Administered Medications Prior to Visit  Medication Dose Route Frequency Provider Last Rate Last Admin   sodium chloride flush (NS) 0.9 % injection 10 mL  10 mL Intracatheter PRN Magrinat, Virgie Dad, MD   10  mL at 09/19/18 1551    Allergies  Allergen Reactions   Genvoya [Elviteg-Cobic-Emtricit-Tenofaf] Hives   Lisinopril Cough   Tegaderm Ag Mesh [Silver] Itching    Review of Systems  Constitutional:  Negative for chills and fever.  Respiratory:  Negative for cough and shortness of breath.   Cardiovascular:  Negative for chest pain.  Skin:  Positive for rash.      Objective:    Physical Exam Vitals and nursing note reviewed.  Constitutional:      Appearance: She is obese.  HENT:     Mouth/Throat:     Mouth: Mucous membranes are moist.     Pharynx: Oropharynx is clear.  Cardiovascular:     Rate and Rhythm: Normal rate and regular rhythm.     Heart sounds: Normal heart sounds.  Pulmonary:     Effort: Pulmonary effort is normal.     Breath sounds: Normal breath sounds.  Abdominal:     General: Bowel sounds are normal.  Skin:    General: Skin is warm.     Findings: Erythema and rash present. Rash is urticarial.       Neurological:     Mental Status: She is alert.    BP (!) 132/100    Pulse 65    Temp 98 F  (36.7 C)    Resp 16    Ht $R'5\' 5"'EE$  (1.651 m)    Wt 233 lb 1 oz (105.7 kg)    LMP 03/31/2014 (LMP Unknown)    SpO2 96%    BMI 38.78 kg/m  Wt Readings from Last 3 Encounters:  11/26/21 233 lb 1 oz (105.7 kg)  11/02/21 238 lb (108 kg)  10/28/21 238 lb (108 kg)    Health Maintenance Due  Topic Date Due   Zoster Vaccines- Shingrix (1 of 2) Never done   TETANUS/TDAP  10/18/2019   PAP SMEAR-Modifier  09/28/2021    There are no preventive care reminders to display for this patient.   Lab Results  Component Value Date   TSH 54.58 (H) 11/02/2021   Lab Results  Component Value Date   WBC 5.7 10/24/2021   HGB 10.9 (L) 10/24/2021   HCT 33.9 (L) 10/24/2021   MCV 96.6 10/24/2021   PLT 227 10/24/2021   Lab Results  Component Value Date   NA 138 10/27/2021   K 3.9 10/27/2021   CO2 29 10/27/2021   GLUCOSE 213 (H) 10/27/2021   BUN 26 (H) 10/27/2021   CREATININE 2.48 (H) 10/27/2021   BILITOT 0.4 10/24/2021   ALKPHOS 63 10/24/2021   AST 26 10/24/2021   ALT 18 10/24/2021   PROT 7.3 10/24/2021   ALBUMIN 3.2 (L) 10/24/2021   CALCIUM 9.0 10/27/2021   ANIONGAP 8 10/25/2021   GFR 21.64 (L) 10/27/2021   Lab Results  Component Value Date   CHOL 133 03/17/2021   Lab Results  Component Value Date   HDL 30 (L) 03/17/2021   Lab Results  Component Value Date   LDLCALC 71 03/17/2021   Lab Results  Component Value Date   TRIG 182 (H) 10/25/2021   Lab Results  Component Value Date   CHOLHDL 4.4 03/17/2021   Lab Results  Component Value Date   HGBA1C 8.0 (H) 10/27/2021       Assessment & Plan:   Problem List Items Addressed This Visit       Endocrine   Non-insulin dependent type 2 diabetes mellitus ([place]) - Primary   Relevant Orders   Glucose (  CBG) (Completed)   Other Visit Diagnoses     Rash       Relevant Medications   methylPREDNISolone acetate (DEPO-MEDROL) injection 40 mg (Completed)   Pruritus       Relevant Medications   hydrOXYzine (ATARAX) 10 MG tablet    methylPREDNISolone acetate (DEPO-MEDROL) injection 40 mg (Completed)        Meds ordered this encounter  Medications   hydrOXYzine (ATARAX) 10 MG tablet    Sig: Take 1 tablet (10 mg total) by mouth 2 (two) times daily as needed for itching.    Dispense:  30 tablet    Refill:  0    Order Specific Question:   Supervising Provider    Answer:   Loura Pardon A [[id number]]   methylPREDNISolone acetate (DEPO-MEDROL) injection 40 mg    This visit occurred during the SARS-CoV-2 public health emergency.  Safety protocols were in place, including screening questions prior to the visit, additional usage of staff PPE, and extensive cleaning of exam room while observing appropriate contact time as indicated for disinfecting solutions.   Romilda Garret, NP

## 2021-11-26 NOTE — Assessment & Plan Note (Signed)
We will send her in some hydroxyzine 10 mg twice daily as needed itching.  Told her she could discontinue her Zyrtec while taking this medication.

## 2021-11-26 NOTE — Patient Instructions (Signed)
Nice to see you today Steroids can make your sugar elevate. Given you were eating on the way here, reason to be why your sugar is elevated.  Be seen immediately if you have any trouble breathing.  I sent in a pill to help with your itching

## 2021-12-08 ENCOUNTER — Ambulatory Visit (HOSPITAL_COMMUNITY)
Admission: RE | Admit: 2021-12-08 | Discharge: 2021-12-08 | Disposition: A | Discharge status: Home / Self Care | Payer: Commercial Managed Care - HMO | Source: Ambulatory Visit | Attending: Urology | Admitting: Urology

## 2021-12-08 ENCOUNTER — Other Ambulatory Visit: Payer: Self-pay

## 2021-12-08 DIAGNOSIS — N184 Chronic kidney disease, stage 4 (severe): Secondary | ICD-10-CM | POA: Insufficient documentation

## 2021-12-08 MED ORDER — FUROSEMIDE 10 MG/ML IJ SOLN
INTRAMUSCULAR | Status: AC
  Administered 2021-12-08: 53 mg via INTRAVENOUS
  Filled 2021-12-08: qty 8

## 2021-12-08 MED ORDER — FUROSEMIDE 10 MG/ML IJ SOLN: 53.0000 mg | Freq: Once | INTRAMUSCULAR | Status: AC

## 2021-12-08 MED ORDER — TECHNETIUM TC 99M MERTIATIDE
5.0000 | Freq: Once | INTRAVENOUS | Status: AC
  Administered 2021-12-08: 5 via INTRAVENOUS

## 2021-12-11 ENCOUNTER — Other Ambulatory Visit: Payer: Self-pay | Admitting: Primary Care

## 2021-12-16 NOTE — Written Directive (Addendum)
MOLECULAR IMAGING AND THERAPEUTICS WRITTEN DIRECTIVE ? ? ?PATIENT NAME: [name] ? ?PT DOB:   09/27/1966 ?                                             ?MRN: [id number] ? ?--------------------------------------------------------------------------------------------------------------------- ? ? ?I-131 THYROID CANCER THERAPY ? ? ?RADIOPHARMACEUTICAL:  Iodine-131 Capsule  ? ? ?PRESCRIBED DOSE FOR ADMINISTRATION: 100 mCi ? ? ?ROUTE OFADMINISTRATION:  PO ? ? ?DIAGNOSIS: Papillary Thyroid Cancer ? ? ?REFERRING PHYSICIAN: Loanne Drilling ? ? ?THYROGEN STIMULATION OR HORMONE WITHDRAW: Thyrogen Stimulation ? ? ?REMNANT ABLATION OR ADJUVANT THERAPY: Remnant ? ? ?DATE OF THYROIDECTOMY: 03/2017 ? ? ?SURGEON: Dr. Melida Quitter ? ? ?TSH:   ?Lab Results  ?Component Value Date  ? TSH 54.58 (H) 11/02/2021  ? TSH 23.96 (H) 07/01/2021  ? TSH 20.04 (H) 04/30/2021  ? ? ? ?PRIOR I-131 THERAPY (Date and Dose): No ? ? ?Pathology: ? ?Cell type: $RemoveBef'[x]'thouHsfTAe$   Papillary  $RemoveBe'[]'uAPqvnjoq$   Follicular  $RemoveBef'[]'ZmxDyvqAXU$   [place]  ? ?Largest tumor focus:      cm ? ?Extrathyroidal Extension?     Yes $Re'[]'sem$   No $R'[]'MC$    ? ?Lymphovascular Invasion?  Yes  $Re'[]'CbQ$   No  $R'[]'Mn$    ? ?Margins positive ? Yes $Rem'[]'tVcB$   No $R'[]'Wm$    ? ?Lymph nodes positive? Yes $RemoveBefor'[x]'VfhHHMDxzuFO$   No  $R'[]'Yo$     ? ? ?# positive nodes:  1 ?# negative nodes:  0 ? ? ? ?TNM staging: pT:         PN:         Mx: ? ?  ?ADDITIONAL PHYSICIAN COMMENTS/NOTES: ?Thyroid Ca not identified in orignal thyroidectomy in 2009.  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  No metastatic disease on prior I 131 scans.   Remnant ablation / adjuvant treatment (LN ? ).  Case discussed in Epic communication with Dr. Loanne Drilling.  ? ? ?AUTHORIZED USER SIGNATURE & TIME STAMP: ?Rennis Golden, MD   12/16/21    4:11 PM   ?

## 2021-12-22 ENCOUNTER — Encounter: Payer: Self-pay | Admitting: Urology

## 2021-12-22 ENCOUNTER — Other Ambulatory Visit: Payer: Self-pay

## 2021-12-22 ENCOUNTER — Ambulatory Visit (INDEPENDENT_AMBULATORY_CARE_PROVIDER_SITE_OTHER): Payer: Managed Care, Other (non HMO) | Admitting: Urology

## 2021-12-22 VITALS — BP 182/95 | HR 71 | Ht 65.0 in | Wt 234.4 lb

## 2021-12-22 DIAGNOSIS — N200 Calculus of kidney: Secondary | ICD-10-CM

## 2021-12-22 DIAGNOSIS — N184 Chronic kidney disease, stage 4 (severe): Secondary | ICD-10-CM

## 2021-12-22 NOTE — Progress Notes (Signed)
? ?  12/22/21 ?11:09 AM  ? ?[name] Cornelia Proudfoot ?1965/11/13 ?[id number] ? ?CC: Right complete staghorn calculus, recurrent UTIs, CKD stage IV ? ?HPI:   ?Very complex and comorbid 56 year old female with morbid obesity and BMI of 39, HIV, diabetes(HgA1c 8), CKD with baseline EGFR of 21, history of nephrolithiasis requiring multiple PCNL on the right side in the past, recently found to have complete right staghorn stone during admission for pancreatitis.  She also has recurrent UTIs likely related to her right-sided staghorn stone.  Of note, she has a history of a complete right staghorn stone about 10 years ago treated in [place] which required for different procedures and apparently a 1 month hospitalization that was quite complicated. ? ?I personally viewed and interpreted both the CT stone protocol dated 10/22/2021 as well as the NM renal scan dated 12/08/2021 that shows a nearly complete right staghorn stone without hydronephrosis, and maintain renal function with right 45% function and no evidence of high-grade obstruction. ? ?I had a long conversation with the patient as well as her sister via Zoom today regarding options.  I recommended treatment with PCNL, as she is at high risk for worsening renal function with her complete right staghorn stone, as well as recurrent urinary tract infections.  Risks of PCNL discussed including bleeding, infection, recurrence, worsening renal function, need for staged procedures, and her higher risk of complications based on her numerous comorbidities.  I had a frank conversation that I think she is better served at a tertiary care center with her multiple medical problems, stage IV CKD, and history of very complex right PCNL that apparently required for prior surgeries and a month-long hospitalization.  They are in agreement with this plan moving forward. ? ?Referral placed to [facility] urology for consideration of PCNL for right complete staghorn stone and patient with numerous  comorbidities and morbid obesity ? ?Nickolas Madrid, MD ?12/22/2021 ? ?[facility] ?[address] ?[place], [place] [address] ?([phone] ? ? ?

## 2021-12-22 NOTE — Patient Instructions (Signed)
Percutaneous Nephrolithotomy Percutaneous nephrolithotomy is a procedure to remove kidney stones. Kidney stones are deposits that form inside your kidneys and can cause pain. You may need this procedure if: You have large kidney stones. Kidney stones that are bigger than 2 cm (0.78 inch) wide may require this procedure. Your kidney stones are oddly shaped. Other treatments have not been successful in helping the kidney stones to pass. You have developed an infection due to the kidney stones. Tell a health care provider about: Any allergies you have. All medicines you are taking, including vitamins, herbs, eye drops, creams, and over-the-counter medicines. Any problems you or family members have had with anesthetic medicines. Any blood disorders you have. Any surgeries you have had. Any medical conditions you have. Whether you are pregnant or may be pregnant. Whether you use any tobacco products, including cigarettes, chewing tobacco, or e-cigarettes. What are the risks? Generally, this is a safe procedure. However, problems may occur, including: Infection. Bleeding. This may include blood in your urine. Allergic reactions to medicines. Damage to other structures or organs. Kidney damage. Holes in the kidney. These often heal on their own. Numbness or tingling in the affected area. Inability to remove all the stones. You may need a different procedure to complete treatment. What happens before the procedure? Staying hydrated Follow instructions from your health care provider about hydration, which may include: Up to 2 hours before the procedure - you may continue to drink clear liquids, such as water, clear fruit juice, black coffee, and plain tea.  Eating and drinking restrictions Follow instructions from your health care provider about eating and drinking, which may include: 8 hours before the procedure - stop eating heavy meals or foods, such as meat, fried foods, or fatty foods. 6  hours before the procedure - stop eating light meals or foods, such as toast or cereal. 6 hours before the procedure - stop drinking milk or drinks that contain milk. 2 hours before the procedure - stop drinking clear liquids. Medicines Ask your health care provider about: Changing or stopping your regular medicines. This is especially important if you are taking diabetes medicines or blood thinners. Taking medicines such as aspirin and ibuprofen. These medicines can thin your blood. Do not take these medicines unless your health care provider tells you to take them. Taking over-the-counter medicines, vitamins, herbs, and supplements. Tests You may have tests, including: Blood tests. Urine tests. Tests to check how your heart is working. Imaging studies. These are used to identify: The size and number (stone burden) of the kidney stones. The position of the kidney stones. General instructions Plan to have someone take you home from the hospital or clinic. Plan to have a responsible adult care for you for at least 24 hours after you leave the hospital or clinic. This is important. Ask your health care provider how your surgical site will be marked or identified. Ask your health care provider what steps will be taken to help prevent infection. These may include: Removing hair at the surgery site. Washing skin with a germ-killing soap. Taking antibiotic medicine. What happens during the procedure?  An IV will be inserted into one of your veins. The site of the procedure will be marked. You will be given one or more of the following: A medicine to help you relax (sedative). A medicine to numb the area (local anesthetic). A medicine to make you fall asleep (general anesthetic). A medicine that is injected into your spine to numb the area below  and slightly above the injection site (spinal anesthetic). A medicine that is injected into an area of your body to numb everything below the  injection site (regional anesthetic). A thin tube (urinary catheter) will be put in your bladder to drain urine during and after the procedure. Your surgeon will make a small cut (incision) in your lower back. A tube will be inserted through the incision into your kidney. Each kidney stone will be removed through this tube. Larger stones may need to be broken up with a high-intensity light beam (laser) or other tools. After all of the stones have been removed, your health care provider may put in tubes to drain your bladder. Based on your condition: An internal tube, called a stent, may be put in your ureter. This will help drain urine from your kidney to your bladder. A surgical drain (nephrostomy tube) may be put in your kidney. The tube comes out through the incision in your lower back. This will help to drain urine or any fluid that builds up while your kidney heals. Part of the incision may be closed with stitches (sutures). A bandage (dressing) will be placed over the incision area. The procedure may vary among health care providers and hospitals. What happens after the procedure? Your blood pressure, heart rate, breathing rate, and blood oxygen level will be monitored until you leave the hospital or clinic. You may be given medicine for pain. You will be shown how to do breathing exercises, such as coughing and breathing deeply. These will help to prevent pneumonia. You will be encouraged to walk. Walking helps to prevent blood clots. Your stent and urinary catheter will be removed after 1-2 days if there is only a small amount of blood in your urine. You will be taught how to care for the catheter or nephrostomy tube, if you have them. Do not drive for 24 hours if you were given a sedative during your procedure. Summary Percutaneous nephrolithotomy is a procedure to remove kidney stones. Ask your health care provider about changing or stopping your regular medicines. Before surgery,  follow instructions from your health care provider about eating and drinking. Plan to have someone take you home from the hospital or clinic. This information is not intended to replace advice given to you by your health care provider. Make sure you discuss any questions you have with your health care provider. Document Revised: 06/07/2021 Document Reviewed: 06/07/2021 Elsevier Patient Education  2022 [employer].   Percutaneous Nephrolithotomy, Care After This sheet gives you information about how to care for yourself after your procedure. Your health care provider may also give you more specific instructions. If you have problems or questions, contact your health care provider. What can I expect after the procedure? After the procedure, it is common to have: Soreness or pain. A small amount of blood or clear fluid coming from your incision for a few days. Fatigue. Some blood in your urine. This will last for a few days. Follow these instructions at home: Medicines Take over-the-counter and prescription medicines only as told by your health care provider. If you were prescribed an antibiotic medicine, take it as told by your health care provider. Do not stop using the antibiotic even if you start to feel better. Ask your health care provider if the medicine prescribed to you: Requires you to avoid driving or using heavy machinery. Can cause constipation. You may need to take these actions to prevent or treat constipation: Drink enough fluid to keep  your urine pale yellow. Take over-the-counter or prescription medicines. Eat foods that are high in fiber, such as beans, whole grains, and fresh fruits and vegetables. Limit foods that are high in fat and processed sugars, such as fried or sweet foods. Incision care  Follow instructions from your health care provider about how to take care of your incision. Make sure you: Wash your hands with soap and water before and after you change your  bandage (dressing). If soap and water are not available, use hand sanitizer. Change your dressing as told by your health care provider. Leave stitches (sutures), skin glue, or adhesive strips in place. These skin closures may need to stay in place for 2 weeks or longer. If adhesive strip edges start to loosen and curl up, you may trim the loose edges. Do not remove adhesive strips completely unless your health care provider tells you to do that. Check your incision area every day for signs of infection. Check for: Redness, swelling, or pain. More fluid or blood. Warmth. Pus or a bad smell. Do not take baths, swim, or use a hot tub until your health care provider approves. Ask your health care provider if you may take showers. You may only be allowed to take sponge baths. Activity Avoid strenuous activities for as long as told by your health care provider. Return to your normal activities as told by your health care provider. Ask your health care provider what activities are safe for you. General instructions If you were sent home with a catheter or surgical drain (nephrostomy tube), follow your health care provider's instructions on how to take care of it. Wear compression stockings as told by your health care provider. These stockings help to prevent blood clots and reduce swelling in your legs. Do not use any products that contain nicotine or tobacco, such as cigarettes, e-cigarettes, and chewing tobacco. These can delay incision healing after surgery. If you need help quitting, ask your health care provider. Keep all follow-up visits as told by your health care provider. This is important. If you still have a stent, your health care provider will need to remove it after 1-2 weeks. Contact a health care provider if: You have a fever. You have redness, swelling, or pain around your incision. You have more fluid or blood coming from your incision. Your incision feels warm to the touch. You  have pus or a bad smell coming from your incision. You lose your appetite. You feel nauseous or you vomit. You have been sent home with a urinary catheter or a surgical drain and urine flow suddenly stops, followed by kidney pain. Get help right away if: You notice a large amount of blood in your urine. You have blood clots in your urine. You cannot urinate. You have chest pain or trouble breathing. Summary After the procedure, it is common to feel soreness and discomfort. You may also see blood in your urine. You will be told how to care for yourself after the procedure. Follow instructions on how to care for your incision and how to recognize signs of infection. Avoid activities that require great effort. Return to activities as told by your health care provider. Get help right away if you have blood clots in your urine, you cannot urinate, or you have trouble breathing. This information is not intended to replace advice given to you by your health care provider. Make sure you discuss any questions you have with your health care provider. Document Revised: 06/07/2021 Document Reviewed:  06/07/2021 Elsevier Patient Education  2022 [employer].

## 2021-12-24 ENCOUNTER — Other Ambulatory Visit: Payer: Self-pay | Admitting: Primary Care

## 2021-12-24 DIAGNOSIS — E119 Type 2 diabetes mellitus without complications: Secondary | ICD-10-CM

## 2021-12-24 DIAGNOSIS — E785 Hyperlipidemia, unspecified: Secondary | ICD-10-CM

## 2021-12-27 NOTE — Telephone Encounter (Addendum)
Patient was denied as of 10/16/21 because [place] no longer offered the family plan in 2023. Patient states she now has [employer] ans she will bring her card to our office to have it scanned. ? ?

## 2021-12-28 ENCOUNTER — Ambulatory Visit (INDEPENDENT_AMBULATORY_CARE_PROVIDER_SITE_OTHER): Payer: Managed Care, Other (non HMO) | Admitting: Internal Medicine

## 2021-12-28 ENCOUNTER — Encounter: Payer: Self-pay | Admitting: Internal Medicine

## 2021-12-28 ENCOUNTER — Ambulatory Visit (INDEPENDENT_AMBULATORY_CARE_PROVIDER_SITE_OTHER): Payer: Managed Care, Other (non HMO)

## 2021-12-28 ENCOUNTER — Other Ambulatory Visit: Payer: Self-pay

## 2021-12-28 DIAGNOSIS — B20 Human immunodeficiency virus [HIV] disease: Secondary | ICD-10-CM

## 2021-12-28 DIAGNOSIS — Z23 Encounter for immunization: Secondary | ICD-10-CM

## 2021-12-28 MED ORDER — EMTRICITABINE-TENOFOVIR AF 200-25 MG PO TABS: 1.0000 | ORAL_TABLET | Freq: Every day | ORAL | 11 refills | Status: DC

## 2021-12-28 MED ORDER — DOLUTEGRAVIR SODIUM 50 MG PO TABS: 50.0000 mg | ORAL_TABLET | Freq: Every day | ORAL | 11 refills | Status: DC

## 2021-12-28 NOTE — Assessment & Plan Note (Signed)
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  She had one viral load that was a clear outlier last year.  I suspect that it was an error as her adherence has consistently been very good.  She will get repeat lab work today and continue [place] and [place].  She will follow-up in 1 year. ?

## 2021-12-28 NOTE — Progress Notes (Signed)
? ?   ? ? ? ? ?Patient Active Problem List  ? Diagnosis Date Noted  ? COVID-19 05/11/2021  ?  Priority: High  ? Malignant neoplasm of lower-inner quadrant of right breast of female, estrogen receptor positive ([place]) 05/10/2018  ?  Priority: Medium   ? Hidradenitis suppurativa of right >> left axilla 01/30/2012  ?  Priority: Medium   ? 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  ?  Priority: Medium   ? Cigarette smoker 02/08/2007  ?  Priority: Medium   ? HIV disease ([place]) 07/24/2006  ?  Priority: Medium   ? Depression 07/24/2006  ?  Priority: Medium   ? Essential hypertension 07/24/2006  ?  Priority: Medium   ? Asthma 07/24/2006  ?  Priority: Medium   ? Pruritus 11/26/2021  ? Chronic illness 10/27/2021  ? Acute renal failure superimposed on stage 4 chronic kidney disease ([place]) 10/24/2021  ? Acute pancreatitis 10/23/2021  ? Acute cough 07/19/2021  ? Lymphadenopathy 07/01/2021  ? Paresthesias 06/30/2021  ? Chronic pain of right hand 06/30/2021  ? Chronic midline low back pain with right-sided sciatica 06/30/2021  ? Vaginitis 06/29/2021  ? UTI (urinary tract infection) 04/30/2021  ? Voice hoarseness 12/15/2020  ? Recurrent falls 11/05/2020  ? Antibiotic-induced yeast infection 09/28/2020  ? Dysuria 09/28/2020  ? Impingement syndrome of left shoulder region 05/27/2020  ? Inclusion cyst 05/01/2020  ? Sebaceous cyst 04/21/2020  ? Postoperative breast asymmetry 02/28/2020  ? Cognitive dysfunction 02/21/2020  ? Chronic gout 01/27/2020  ? Hematuria 12/10/2019  ? Neuropathy due to chemotherapeutic drug ([place]) 12/10/2019  ? Pain in joint of left shoulder 09/05/2019  ? Dysphagia 07/23/2019  ? CKD stage 3 due to type 2 diabetes mellitus ([place]) 07/12/2019  ? Normocytic anemia 07/12/2019  ? Preventative health care 09/10/2018  ? Genetic testing 07/25/2018  ? Port-A-Cath in place 07/12/2018  ? Family history of breast cancer   ? Family history of prostate cancer   ? Family history of lung cancer   ? Thyroid cancer ([place])   ? Hyperlipidemia  04/26/2017  ? 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 GAD (generalized anxiety disorder) 04/11/2017  ? Mass of left side of neck 11/24/2016  ? Laryngopharyngeal reflux (LPR) 11/24/2016  ? Upper airway cough syndrome 11/11/2016  ? Obesity (BMI 30-39.9) 10/14/2016  ? Allergic rhinitis 02/18/2016  ? Non-insulin dependent type 2 diabetes mellitus ([place]) 06/23/2015  ? Renal calculi 10/29/2014  ? Recurrent boils 06/11/2014  ? Rash 02/16/2012  ? Sarcoidosis 02/08/2007  ? ? ?Patient's Medications  ?New Prescriptions  ? No medications on file  ?Previous Medications  ? ACETAMINOPHEN (TYLENOL) 500 MG TABLET    Take 1,000 mg by mouth every 6 (six) hours as needed for mild pain.  ? ALBUTEROL (VENTOLIN HFA) 108 (90 BASE) MCG/ACT INHALER    Inhale 2 puffs into the lungs every 6 (six) hours as needed for wheezing or shortness of breath.  ? ALLOPURINOL (ZYLOPRIM) 100 MG TABLET    TAKE 1 TABLET BY MOUTH EVERY DAY FOR GOUT PREVENTION  ? AMLODIPINE (NORVASC) 5 MG TABLET    Take 1 tablet (5 mg total) by mouth daily.  ? BACLOFEN (LIORESAL) 10 MG TABLET    Take 10 mg by mouth 3 (three) times daily as needed for muscle spasms.  ? BLOOD GLUCOSE MONITORING SUPPL (FREESTYLE LITE) W/DEVICE KIT    Inject 1 each into the skin daily.  ? CANDESARTAN (ATACAND) 32 MG TABLET    PLEASE SEE YOUR PCP PRIOR TO RESTARTING  ?  CARVEDILOL (COREG) 25 MG TABLET    TAKE 1 TABLET BY MOUTH TWICE A DAY  ? CETIRIZINE (ZYRTEC) 10 MG TABLET    Take 1 tablet by mouth at bedtime for allergies.  ? FAMOTIDINE (PEPCID) 20 MG TABLET    TAKE 1 TO 2 TABLETS BY MOUTH AT BEDTIME FOR HOARSE VOICE/REFLUX  ? FLOVENT HFA 110 MCG/ACT INHALER    TAKE 1 PUFF BY MOUTH TWICE A DAY  ? FLUTICASONE (FLONASE) 50 MCG/ACT NASAL SPRAY    Place 2 sprays into both nostrils daily.  ? GABAPENTIN (NEURONTIN) 300 MG CAPSULE    Take 300 mg by mouth daily.  ? GLIPIZIDE (GLUCOTROL) 10 MG TABLET    Take 1 tablet (10 mg total) by mouth 2 (two) times daily. For diabetes.  ? GLUCOSE BLOOD  (ONETOUCH VERIO) TEST STRIP    USE AS INSTRUCTED TO TEST BLOOD SUGAR up to 3 times daily.  ? HYDROXYZINE (ATARAX) 10 MG TABLET    Take 1 tablet (10 mg total) by mouth 2 (two) times daily as needed for itching.  ? LANCETS (ONETOUCH ULTRASOFT) LANCETS    Use as instructed to test blood sugar daily  ? LEVOTHYROXINE (SYNTHROID) 125 MCG TABLET    Take 2 tablets (250 mcg total) by mouth daily.  ? OMEPRAZOLE (PRILOSEC) 40 MG CAPSULE    TAKE 1 CAPSULE BY MOUTH DAILY FOR REFLUX  ? ONDANSETRON (ZOFRAN-ODT) 4 MG DISINTEGRATING TABLET    Take 1 tablet (4 mg total) by mouth every 8 (eight) hours as needed for nausea or vomiting.  ? ROSUVASTATIN (CRESTOR) 10 MG TABLET    TAKE 1 TABLET BY MOUTH EVERY DAY FOR CHOLESTEROL  ? SERTRALINE (ZOLOFT) 50 MG TABLET    TAKE 1 TABLET (50 MG TOTAL) BY MOUTH DAILY. FOR ANXIETY AND DEPRESSION.  ? SPIRONOLACTONE (ALDACTONE) 25 MG TABLET    Take 1 tablet (25 mg total) by mouth daily.  ? TAMOXIFEN (NOLVADEX) 20 MG TABLET    Take 1 tablet (20 mg total) by mouth daily.  ?Modified Medications  ? Modified Medication Previous Medication  ? DOLUTEGRAVIR ([name]) 50 MG TABLET dolutegravir ([name]) 50 MG tablet  ?    Take 1 tablet (50 mg total) by mouth daily.    Take 1 tablet (50 mg total) by mouth daily.  ? EMTRICITABINE-TENOFOVIR AF (DESCOVY) 200-25 MG TABLET emtricitabine-tenofovir AF (DESCOVY) 200-25 MG tablet  ?    Take 1 tablet by mouth daily.    Take 1 tablet by mouth daily.  ?Discontinued Medications  ? No medications on file  ? ? ?Subjective: ?[name] is in for her routine HIV follow-up visit.  She denies any recent problems obtaining, taking or tolerating her Descovy or [name].  She does not think she has been missing doses.  She was recently hospitalized with idiopathic pancreatitis that cause pain for about 1 week.  She has had no recurrences.  She was recently seen at [facility] for a large right-sided staghorn calculus.  I called her yesterday and said that they could not proceed with lithotripsy  at [facility] because they were considered out of network by her insurance.  She says that her mood has been up and down recently.  She has not had a COVID booster vaccine recently. ? ?Review of Systems: ?Review of Systems  ?Constitutional:  Negative for fever and weight loss.  ?Psychiatric/Behavioral:  Positive for depression. The patient is not nervous/anxious.   ? ?Past Medical History:  ?Diagnosis Date  ? Allergy   ? Anemia   ?  Normocytic  ? Anxiety   ? Asthma   ? Blood dyscrasia   ? Bronchitis 2005  ? Bursitis of left shoulder 09/06/2019  ? CKD (chronic kidney disease)   ? CLASS 1-EXOPHTHALMOS-THYROTOXIC 02/08/2007  ? Diabetes mellitus without complication ([place])   ? Family history of breast cancer   ? Family history of lung cancer   ? Family history of prostate cancer   ? Gastroenteritis 07/10/07  ? GERD 07/24/2006  ? GRAVE'S DISEASE 01/01/2008  ? History of hidradenitis suppurativa   ? History of kidney stones   ? History of thrush   ? HIV DISEASE 07/24/2006  ? dx March 05  ? Hyperlipidemia   ? HYPERTENSION 07/24/2006  ? Hyperthyroidism 08/2006  ? Grave's Disease -diffuse radiotracer uptake 08/25/06 Thyroid scan-Cold nodule to R lower lobe of thyrorid  ? Menometrorrhagia   ? hx of  ? Nephrolithiasis   ? Panniculitis 05/12/2020  ? 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  ? Personal history of chemotherapy   ? 2020  ? Personal history of radiation therapy   ? 2020  ? Pneumonia 2005  ? Port-A-Cath in place 07/12/2018  ? 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  ? Sarcoidosis 02/08/2007  ? dx as a teenager in [place] from abnl CXR. Completed 2 yrs Prednisone after lung bx confirmation. No symptoms since then.  ? Suppurative hidradenitis   ? Thyroid cancer ([place])   ? THYROID NODULE, RIGHT 02/08/2007  ? ? ?Social History  ? ?Tobacco Use  ? Smoking  status: Former  ?  Packs/day: 0.50  ?  Years: 15.00  ?  Pack years: 7.50  ?  Types: Cigarettes  ?  Start date: 04/12/2017  ?  Quit date: 2019  ?  Years since quitting: 4.2  ? Smokeless tobacco: Never  ?[name]

## 2021-12-30 ENCOUNTER — Encounter: Payer: Self-pay | Admitting: Oncology

## 2021-12-30 ENCOUNTER — Telehealth: Payer: Self-pay

## 2021-12-30 ENCOUNTER — Other Ambulatory Visit (HOSPITAL_COMMUNITY): Payer: Self-pay

## 2021-12-30 LAB — RPR: RPR Ser Ql: NONREACTIVE

## 2021-12-30 LAB — HIV-1 RNA QUANT-NO REFLEX-BLD
HIV 1 RNA Quant: NOT DETECTED Copies/mL
HIV-1 RNA Quant, Log: NOT DETECTED Log cps/mL

## 2021-12-30 LAB — T-HELPER CELLS (CD4) COUNT (NOT AT ARMC)
Absolute CD4: 685 cells/uL (ref 490–1740)
CD4 T Helper %: 38 % (ref 30–61)
Total lymphocyte count: 1803 cells/uL (ref 850–3900)

## 2021-12-30 NOTE — Telephone Encounter (Signed)
PA for Descovy submitted via covermymeds. Awaiting response.  ? ?Key: [id number] ? ?Beryle Flock, RN ? ?

## 2021-12-31 ENCOUNTER — Other Ambulatory Visit (HOSPITAL_COMMUNITY): Payer: Self-pay

## 2022-01-04 ENCOUNTER — Ambulatory Visit (HOSPITAL_COMMUNITY)
Admission: RE | Admit: 2022-01-04 | Discharge: 2022-01-04 | Disposition: A | Discharge status: Home / Self Care | Payer: Commercial Managed Care - HMO | Source: Ambulatory Visit | Attending: Endocrinology | Admitting: Endocrinology

## 2022-01-04 ENCOUNTER — Other Ambulatory Visit: Payer: Self-pay

## 2022-01-04 DIAGNOSIS — C73 Malignant neoplasm of thyroid gland: Secondary | ICD-10-CM | POA: Diagnosis present

## 2022-01-04 MED ORDER — THYROTROPIN ALFA 0.9 MG IM SOLR
INTRAMUSCULAR | Status: AC
  Filled 2022-01-04: qty 0.9

## 2022-01-04 MED ORDER — THYROTROPIN ALFA 0.9 MG IM SOLR
0.9000 mg | INTRAMUSCULAR | Status: AC
  Administered 2022-01-04: 0.9 mg via INTRAMUSCULAR

## 2022-01-05 ENCOUNTER — Encounter (HOSPITAL_COMMUNITY)
Admission: RE | Admit: 2022-01-05 | Discharge: 2022-01-05 | Disposition: A | Discharge status: Home / Self Care | Payer: Commercial Managed Care - HMO | Source: Ambulatory Visit | Attending: Endocrinology | Admitting: Endocrinology

## 2022-01-05 DIAGNOSIS — C73 Malignant neoplasm of thyroid gland: Secondary | ICD-10-CM | POA: Diagnosis not present

## 2022-01-05 MED ORDER — THYROTROPIN ALFA 0.9 MG IM SOLR
INTRAMUSCULAR | Status: AC
  Administered 2022-01-05: 0.9 mg via INTRAMUSCULAR
  Filled 2022-01-05: qty 0.9

## 2022-01-05 MED ORDER — THYROTROPIN ALFA 0.9 MG IM SOLR: 0.9000 mg | INTRAMUSCULAR | Status: AC

## 2022-01-06 ENCOUNTER — Encounter (HOSPITAL_COMMUNITY)
Admission: RE | Admit: 2022-01-06 | Discharge: 2022-01-06 | Disposition: A | Discharge status: Home / Self Care | Payer: Commercial Managed Care - HMO | Source: Ambulatory Visit | Attending: Endocrinology | Admitting: Endocrinology

## 2022-01-06 ENCOUNTER — Other Ambulatory Visit: Payer: Self-pay

## 2022-01-06 MED ORDER — THYROTROPIN ALFA 0.9 MG IM SOLR
0.9000 mg | INTRAMUSCULAR | Status: AC
  Administered 2022-01-05: 0.9 mg via INTRAMUSCULAR

## 2022-01-06 MED ORDER — SODIUM IODIDE I 131 CAPSULE
108.2000 | Freq: Once | INTRAVENOUS | Status: AC
  Administered 2022-01-06: 108.2 via ORAL

## 2022-01-11 ENCOUNTER — Other Ambulatory Visit: Payer: Self-pay

## 2022-01-11 MED ORDER — CANDESARTAN CILEXETIL 32 MG PO TABS: ORAL_TABLET | ORAL | 11 refills | Status: DC

## 2022-01-12 NOTE — Telephone Encounter (Signed)
PA for Descovy has been approved 12/30/21-01/11/23. ?PA approval faxed to patient's pharmacy.  ?

## 2022-01-13 ENCOUNTER — Telehealth (HOSPITAL_BASED_OUTPATIENT_CLINIC_OR_DEPARTMENT_OTHER): Payer: Self-pay | Admitting: *Deleted

## 2022-01-13 NOTE — Telephone Encounter (Signed)
Received refill from [facility] on refill for Candesartan. When patient d/c from hospital patient was told to hold until seen by PCP She is not sure if she is currently taking  ?Spoke with patient and she has an appointment with her nephrologist next week and will discuss with him  ?

## 2022-01-14 ENCOUNTER — Ambulatory Visit (HOSPITAL_COMMUNITY)
Admission: RE | Admit: 2022-01-14 | Discharge: 2022-01-14 | Disposition: A | Discharge status: Home / Self Care | Payer: Commercial Managed Care - HMO | Source: Ambulatory Visit | Attending: Endocrinology | Admitting: Endocrinology

## 2022-01-14 DIAGNOSIS — C73 Malignant neoplasm of thyroid gland: Secondary | ICD-10-CM | POA: Insufficient documentation

## 2022-01-18 ENCOUNTER — Other Ambulatory Visit (HOSPITAL_COMMUNITY): Payer: Self-pay

## 2022-01-18 ENCOUNTER — Telehealth: Payer: Self-pay

## 2022-01-18 ENCOUNTER — Ambulatory Visit: Payer: 59

## 2022-01-18 NOTE — Telephone Encounter (Signed)
[facility] Patient Advocate Encounter ? ?Prior Authorization for Descovy has been approved.   ? ?PA# [id number] ?Effective dates: 12/30/21 through 01/11/23 ? ? ? ?[facility] will continue to follow. ? ?Ileene Patrick, CPhT ?Specialty Pharmacy Patient Advocate ?[facility] ?Phone: [phone] ?Fax:  [phone]  ?

## 2022-01-27 ENCOUNTER — Ambulatory Visit (INDEPENDENT_AMBULATORY_CARE_PROVIDER_SITE_OTHER): Payer: Managed Care, Other (non HMO) | Admitting: Primary Care

## 2022-01-27 ENCOUNTER — Encounter: Payer: Self-pay | Admitting: Primary Care

## 2022-01-27 VITALS — BP 146/88 | HR 70 | Temp 98.6°F | Ht 65.0 in | Wt 230.0 lb

## 2022-01-27 DIAGNOSIS — U099 Post covid-19 condition, unspecified: Secondary | ICD-10-CM | POA: Diagnosis not present

## 2022-01-27 DIAGNOSIS — E785 Hyperlipidemia, unspecified: Secondary | ICD-10-CM

## 2022-01-27 DIAGNOSIS — J309 Allergic rhinitis, unspecified: Secondary | ICD-10-CM

## 2022-01-27 DIAGNOSIS — E119 Type 2 diabetes mellitus without complications: Secondary | ICD-10-CM | POA: Diagnosis not present

## 2022-01-27 LAB — POCT GLYCOSYLATED HEMOGLOBIN (HGB A1C): Hemoglobin A1C: 7.9 % — AB (ref 4.0–5.6)

## 2022-01-27 NOTE — Assessment & Plan Note (Signed)
Suspect otalgia secondary to allergy development, exam today benign. ? ?Continue Zyrtec at bedtime. ?Add Flonase twice daily. ? ?Resume omeprazole 40 mg at bedtime. ? ?She will update. ?

## 2022-01-27 NOTE — Patient Instructions (Addendum)
Continue taking Glipizide 10 mg twice daily for diabetes. ? ?Stop by the lab prior to leaving today. I will notify you of your results once received.  ? ?If your kidney function looks better, then we can initiate the Jardiance medication for your diabetes. ? ?Nasal Congestion/Ear Pressure/Sinus Pressure: Try using Flonase (fluticasone) nasal spray. Instill 1 spray in each nostril twice daily.  ? ?Stop famotidine for heartburn. Resume your omeprazole 40 mg nightly for heartburn and drainage.  ? ?Please schedule a follow up visit for 3 months for diabetes check. ? ?It was a pleasure to see you today! ? ? ? ?

## 2022-01-27 NOTE — Assessment & Plan Note (Signed)
Suspect ongoing loss of taste and mucus development are symptoms of COVID-19 long-hauler. ? ?We also discussed other causes for her mucous including allergies, GERD, prior smoking history. ? ? ?

## 2022-01-27 NOTE — Progress Notes (Signed)
? ?Subjective:  ? ? Patient ID: [name], female    DOB: 09/25/1966, 56 y.o.   MRN: [id number] ? ?HPI ? ?[name] is a very pleasant 56 y.o. female with a significant medical history including hypertension, postsurgical hypothyroidism, type 2 diabetes, CKD, breast cancer, thyroid cancer, HIV, tobacco abuse, UTI, GAD, chronic gout who presents today for follow-up of diabetes and to discuss ear pain.  ? ?1) Type 2 Diabetes: Current medications include: Glipizide 10 mg twice daily. She has not had her Jardiance in months, this was discontinued during her last hospital stay given her UTI and renal stones. ? ?Her Januvia was discontinued during her last visit given her recent history of pancreatitis.  She is currently dealing with a return of her thyroid cancer and cannot take GLP-1 antagonist.  Current GFR is 25, recently evaluated by nephrology last week.  ? ?She is checking her blood glucose 3-4 times daily and is getting readings of 130's ? ?Last A1C: 8.0 in January 2023, 7.9 today ?Last Eye Exam: Due ?Last Foot Exam: UTD ?Pneumonia Vaccination: 2019 ?Urine Microalbumin: None.  Managed on ARB ?Statin: Rosuvastatin ? ?Dietary changes since last visit: She cut her carbs and sugars, she is no longer drinking sodas.  ? ? ?Exercise: Walking ? ?BP Readings from Last 3 Encounters:  ?01/27/22 (!) 146/88  ?12/22/21 (!) 182/95  ?11/26/21 (!) 132/100  ? ?2) Ear Pain: Symptoms onset two weeks ago, pain with yawning. Chronic mucous from her nasal cavity and chest, post nasal drip, loss of taste. She's compliant to Zyrtec nightly.  Her mucus is whitish in color. ? ?History of smoking for 10-15 years, quit in 2019.  ? ?She has had COVID-19 infection 3 times, last infection was in December 2022.  She was told by one of her prior doctors that she could have long-haul or COVID symptoms. ? ?She is managed on famotidine 20 mg at bedtime for GERD which helps somewhat.  She does have omeprazole 40 mg at her house.   ? ? ? ?Review of Systems  ?HENT:  Positive for congestion, ear pain and postnasal drip.   ?Respiratory:  Negative for shortness of breath.   ?Cardiovascular:  Negative for chest pain.  ?Gastrointestinal:   ?     Esophageal burning  ?Allergic/Immunologic: Positive for environmental allergies.  ?Neurological:  Positive for numbness.  ? ?   ? ? ?Past Medical History:  ?Diagnosis Date  ? Allergy   ? Anemia   ? Normocytic  ? Anxiety   ? Asthma   ? Blood dyscrasia   ? Bronchitis 2005  ? Bursitis of left shoulder 09/06/2019  ? CKD (chronic kidney disease)   ? CLASS 1-EXOPHTHALMOS-THYROTOXIC 02/08/2007  ? Diabetes mellitus without complication ([place])   ? Family history of breast cancer   ? Family history of lung cancer   ? Family history of prostate cancer   ? Gastroenteritis 07/10/07  ? GERD 07/24/2006  ? GRAVE'S DISEASE 01/01/2008  ? History of hidradenitis suppurativa   ? History of kidney stones   ? History of thrush   ? HIV DISEASE 07/24/2006  ? dx March 05  ? Hyperlipidemia   ? HYPERTENSION 07/24/2006  ? Hyperthyroidism 08/2006  ? Grave's Disease -diffuse radiotracer uptake 08/25/06 Thyroid scan-Cold nodule to R lower lobe of thyrorid  ? Menometrorrhagia   ? hx of  ? Nephrolithiasis   ? Panniculitis 05/12/2020  ? 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  ? Personal history of chemotherapy   ? 2020  ? Personal history of radiation therapy   ? 2020  ? Pneumonia 2005  ? Port-A-Cath in place 07/12/2018  ? 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  ? Sarcoidosis 02/08/2007  ? dx as a teenager in [place] from abnl CXR. Completed 2 yrs Prednisone after lung bx confirmation. No symptoms since then.  ? Suppurative hidradenitis   ? Thyroid cancer ([place])   ? THYROID NODULE, RIGHT 02/08/2007  ? ? ?Social History  ? ?Socioeconomic History  ? Marital status:  Single  ?  Spouse name: Not on file  ? Number of children: 1  ? Years of education: Not on file  ? Highest education level: Not on file  ?Occupational History  ? Occupation: Secondary school teacher  ?Tobacco Use  ? Smoking status: Former  ?  Packs/day: 0.50  ?  Years: 15.00  ?  Pack years: 7.50  ?  Types: Cigarettes  ?  Start date: 04/12/2017  ?  Quit date: 2019  ?  Years since quitting: 4.2  ? Smokeless tobacco: Never  ?Vaping Use  ? Vaping Use: Never used  ?Substance and Sexual Activity  ? Alcohol use: Yes  ?  Alcohol/week: 0.0 standard drinks  ?  Comment: social  ? Drug use: No  ? Sexual activity: Not Currently  ?  Birth control/protection: Post-menopausal  ?  Comment: declined condoms  ?Other Topics Concern  ? Not on file  ?Social History Narrative  ? Right Handed   ? Lives in a two story home  ? ?Social Determinants of Health  ? ?Financial Resource Strain: Not on file  ?Food Insecurity: Not on file  ?Transportation Needs: Not on file  ?Physical Activity: Not on file  ?Stress: Not on file  ?Social Connections: Not on file  ?Intimate Partner Violence: Not on file  ? ? ?Past Surgical History:  ?Procedure Laterality Date  ? APPLICATION OF WOUND VAC N/A 01/20/2021  ? Procedure: APPLICATION OF WOUND VAC;  Surgeon: Wallace Going, DO;  Location: [facility];  Service: Plastics;  Laterality: N/A;  ? BREAST EXCISIONAL BIOPSY Right 04/26/2018  ? right axilla negative  ? BREAST EXCISIONAL BIOPSY Left 04/26/2018  ? left axilla negative  ? BREAST LUMPECTOMY Right 10/03/2018  ? malignant  ? BREAST LUMPECTOMY WITH RADIOACTIVE SEED AND SENTINEL LYMPH NODE BIOPSY Right 10/03/2018  ? Procedure: RIGHT BREAST LUMPECTOMY WITH RADIOACTIVE SEED AND SENTINEL LYMPH NODE MAPPING;  Surgeon: Erroll Luna, MD;  Location: [facility];  Service: General;  Laterality: Right;  ? Major  ? Breast Reduction   ? CYSTOSCOPY W/ URETERAL STENT REMOVAL  11/09/2012  ? Procedure: CYSTOSCOPY WITH STENT REMOVAL;  Surgeon: Alexis Frock, MD;  Location: [facility];  Service: Urology;  Laterality: Right;  ? CYSTOSCOPY WITH RETROGRADE PYELOGRAM, URETEROSCOPY AND STENT PLACEMENT  11/09/2012  ? Procedure: CYSTOSCOPY WITH RETROGRADE PYELOGRAM, URETEROSCOPY AND STENT PLACEMENT;  Surgeon: Alexis Frock, MD;  Location: [facility];  Service: Urology;  Laterality: Left;  LEFT URETEROSCOPY, STONE MANIPULATION, left STENT exchange ?  ? CYSTOSCOPY WITH STENT PLACEMENT  10/02/2012  ? Procedure: CYSTOSCOPY WITH STENT PLACEMENT;  Surgeon: Alexis Frock, MD;  Location: [facility];  Service: Urology;  Laterality: Left;  ? DEBRIDEMENT AND CLOSURE WOUND N/A 01/20/2021  ? Procedure: Excision of abdominal wound with closure;  Surgeon: Wallace Going, DO;  Location: [facility];  Service: Clinical cytogeneticist;  Laterality: N/A;  ? DILATION AND CURETTAGE OF UTERUS  Feb 2004  ? s/p for 1st trimester nonviable pregnancy  ? EYE SURGERY    ? sty under eyelid  ? INCISE AND DRAIN ABCESS  Nov 03  ? s/p I &D for righ inframmary fold hidradenitis  ? INCISION AND DRAINAGE PERITONSILLAR ABSCESS  Mar 03  ? IR CV LINE INJECTION  06/07/2018  ? IR IMAGING GUIDED PORT INSERTION  06/20/2018  ? IR REMOVAL TUN ACCESS W/ PORT W/O FL MOD SED  06/20/2018  ? IRRIGATION AND DEBRIDEMENT ABSCESS  01/31/2012  ? Procedure: IRRIGATION AND DEBRIDEMENT ABSCESS;  Surgeon: Shann Medal, MD;  Location: [facility];  Service: General;  Laterality: Right;  right breast and axilla   ? NEPHROLITHOTOMY  10/02/2012  ? Procedure: NEPHROLITHOTOMY PERCUTANEOUS;  Surgeon: Alexis Frock, MD;  Location: [facility];  Service: Urology;  Laterality: Right;  First Stage Percutaneous Nephrolithotomy with Surgeon Access, Left Ureteral Stent  ?  ? NEPHROLITHOTOMY  10/04/2012  ? Procedure: NEPHROLITHOTOMY PERCUTANEOUS SECOND LOOK;  Surgeon: Alexis Frock, MD;  Location: [facility];  Service: Urology;  Laterality: Right;   ?  ? NEPHROLITHOTOMY  10/08/2012  ? Procedure: NEPHROLITHOTOMY PERCUTANEOUS;  Surgeon: Alexis Frock, MD;  Location:  [facility];  Service: Urology;  Laterality: Right;  THIRD STAGE, nephrostomy tube exchange x 2  ? NEPHROLITHOTOMY  10/11/2012  ? Procedure: NEPHROLITHOTOMY PERCUTANEOUS SECOND LOOK;  Surgeon: Alexis Frock, MD;

## 2022-01-27 NOTE — Assessment & Plan Note (Signed)
Slight improvement with A1c is 7.9 today, still above goal.  Would like to see her A1c below 7 given her chronic comorbidities. ? ?Unfortunately treatment options are limited given her renal function, thyroid cancer, pancreatitis history. ? ?She is not eligible for metformin, SGLT2, GLP-1 agonist, DPP 4 treatment. ? ?Continue glipizide 10 mg twice daily. ?Commended her on dietary changes. ? ?We will consult with my pharmacist to see if there are any other options aside from insulin. ?

## 2022-02-01 ENCOUNTER — Ambulatory Visit: Payer: Managed Care, Other (non HMO) | Admitting: Pharmacist Clinician (PhC)/ Clinical Pharmacy Specialist

## 2022-02-01 DIAGNOSIS — I10 Essential (primary) hypertension: Secondary | ICD-10-CM | POA: Diagnosis not present

## 2022-02-01 MED ORDER — HYDRALAZINE HCL 50 MG PO TABS: 50.0000 mg | ORAL_TABLET | Freq: Two times a day (BID) | ORAL | 6 refills | Status: DC

## 2022-02-01 MED ORDER — CARVEDILOL 25 MG PO TABS: 25.0000 mg | ORAL_TABLET | Freq: Two times a day (BID) | ORAL | 3 refills | Status: DC

## 2022-02-01 NOTE — Progress Notes (Signed)
? ? ? ?02/01/2022 ?[name] ?Nov 04, 1965 ?[id number] ? ? ?HPI:  [name] is a 56 y.o. female patient of Dr Oval Linsey, with a PMH below who presents today for advanced hypertension follow up.  She was referred to our clinic by Dr. Haroldine Laws, who sees her for cardio-oncology.  originally seen by Dr. Oval Linsey last fall, with a pressure of 162/102. At that visit she noted that home readings were mostly 132-440'N systolic, however the diastolic was 02-725'D.  She had also just started prednisone for a gout flare at that time.  At follow up visits she continued to have problems with diastolic elevations.  At her last visit with me in August, it was determined that she was taking carvedilol only once daily in the mornings with her other meds.   I saw her last  just after Thanksgiving.  At that time her pressure was still elevated at 166/110, and amlodipine 5 mg was added.  She was seen again in January, at which time she complained about taking too many medications.  She was not willing to increase BP medications at that time.   ? ?Today she returns for follow up.  She notes having checked her blood pressure occasionally since her last visit 3 months ago.  States home readings still mostly 664-40'H and diastolic 47-42'V.  She had some confusion about her candesartan, but states is now back on that daily.  A check with her pharmacy notes that she is due tor a refill on that, and has not refilled the carvedilol since last August (90 day supply).   ? - ?Past Medical History: ?hyperlipidemia 6/22 - LDL 71 on rosuvastatin 10 mg daily  ?Breast cancer Now on tamoxifen  ?DM2 8/22 A1c 6.1 on Jardiance 10 mg  ?CKD3 6/22 SCr 2.74 CrCl with adjusted body weight - 28  ?hypothroidism 7/22 TSH 20.04 - on levothyroxine 112 mcg (needs f/u with Endo)  ?  ? ?Blood Pressure Goal:  130/80 ? ?Current Medications: candesartan 32 mg qd, carvedilol 25 mg bid, spironolactone 25 mg qd, amlodipine 5 mg qd ? ?Family Hx: mother  with heart disease; both parents and multiple siblings with hypertension, breast cancer in 3 relatives, (both paternal and maternal) ? ?Social Hx: no tobacco, only occasional alcohol; no regular caffeine intake ? ?Diet: usually eats 2 meals per day, has some intolerances to smells, so avoids restaurants, does not add salt to her foods. Oatmeal for breakfast and sometimes, adds banansa; salad and salmon; hasn't been snacking since hospitla, likes chips; prefers baked chips  ? ?Exercise: none ? ?Home BP readings: no readings with her ? ?Intolerances: lisinopril - cough; hctz -gout flares ? ?Labs:   10/27/21:  Na 138, K 3.9, Glu 213, BUN 26, SCr 2.48, GFR 21.6 ?    10/22:  Na 140, K 4.0, Glu 104, SCr 2.72, GFR < 20 ? ? ?Wt Readings from Last 3 Encounters:  ?02/01/22 231 lb 3.2 oz (104.9 kg)  ?01/27/22 230 lb (104.3 kg)  ?12/28/21 233 lb (105.7 kg)  ? ?BP Readings from Last 3 Encounters:  ?02/01/22 (!) 144/92  ?01/27/22 (!) 146/88  ?12/22/21 (!) 182/95  ? ?Pulse Readings from Last 3 Encounters:  ?02/01/22 75  ?01/27/22 70  ?12/22/21 71  ? ? ?Current Outpatient Medications  ?Medication Sig Dispense Refill  ? acetaminophen (TYLENOL) 500 MG tablet Take 1,000 mg by mouth every 6 (six) hours as needed for mild pain.    ? albuterol (VENTOLIN HFA) 108 (90 Base) MCG/ACT inhaler  Inhale 2 puffs into the lungs every 6 (six) hours as needed for wheezing or shortness of breath. 8 g 2  ? allopurinol (ZYLOPRIM) 100 MG tablet TAKE 1 TABLET BY MOUTH EVERY DAY FOR GOUT PREVENTION 90 tablet 1  ? baclofen (LIORESAL) 10 MG tablet Take 10 mg by mouth 3 (three) times daily as needed for muscle spasms.    ? Blood Glucose Monitoring Suppl (FREESTYLE LITE) w/Device KIT Inject 1 each into the skin daily. 1 kit 0  ? candesartan (ATACAND) 32 MG tablet PLEASE SEE YOUR PCP PRIOR TO RESTARTING 30 tablet 11  ? carvedilol (COREG) 25 MG tablet Take 1 tablet (25 mg total) by mouth 2 (two) times daily. 180 tablet 3  ? cetirizine (ZYRTEC) 10 MG tablet Take 1  tablet by mouth at bedtime for allergies. 90 tablet 3  ? dolutegravir ([name]) 50 MG tablet Take 1 tablet (50 mg total) by mouth daily. 30 tablet 11  ? emtricitabine-tenofovir AF (DESCOVY) 200-25 MG tablet Take 1 tablet by mouth daily. 30 tablet 11  ? FLOVENT HFA 110 MCG/ACT inhaler TAKE 1 PUFF BY MOUTH TWICE A DAY (Patient taking differently: Inhale 1 puff into the lungs 2 (two) times daily as needed (wheezing/shortness of breath).) 36 Inhaler 1  ? fluticasone (FLONASE) 50 MCG/ACT nasal spray Place 2 sprays into both nostrils daily. 16 g 6  ? gabapentin (NEURONTIN) 300 MG capsule Take 300 mg by mouth daily.    ? glipiZIDE (GLUCOTROL) 10 MG tablet Take 1 tablet (10 mg total) by mouth 2 (two) times daily. For diabetes. 180 tablet 1  ? glucose blood (ONETOUCH VERIO) test strip USE AS INSTRUCTED TO TEST BLOOD SUGAR up to 3 times daily. 300 each 3  ? hydrALAZINE (APRESOLINE) 50 MG tablet Take 1 tablet (50 mg total) by mouth in the morning and at bedtime. 60 tablet 6  ? hydrOXYzine (ATARAX) 10 MG tablet Take 1 tablet (10 mg total) by mouth 2 (two) times daily as needed for itching. 30 tablet 0  ? Lancets (ONETOUCH ULTRASOFT) lancets Use as instructed to test blood sugar daily 100 each 5  ? levothyroxine (SYNTHROID) 125 MCG tablet Take 2 tablets (250 mcg total) by mouth daily. 180 tablet 3  ? omeprazole (PRILOSEC) 40 MG capsule TAKE 1 CAPSULE BY MOUTH DAILY FOR REFLUX 90 capsule 3  ? ondansetron (ZOFRAN-ODT) 4 MG disintegrating tablet Take 1 tablet (4 mg total) by mouth every 8 (eight) hours as needed for nausea or vomiting. 20 tablet 0  ? rosuvastatin (CRESTOR) 10 MG tablet TAKE 1 TABLET BY MOUTH EVERY DAY FOR CHOLESTEROL 90 tablet 0  ? sertraline (ZOLOFT) 50 MG tablet TAKE 1 TABLET (50 MG TOTAL) BY MOUTH DAILY. FOR ANXIETY AND DEPRESSION. 90 tablet 3  ? spironolactone (ALDACTONE) 25 MG tablet Take 1 tablet (25 mg total) by mouth daily. 90 tablet 3  ? tamoxifen (NOLVADEX) 20 MG tablet Take 1 tablet (20 mg total) by  mouth daily. 90 tablet 4  ? ?No current facility-administered medications for this visit.  ? ?Facility-Administered Medications Ordered in Other Visits  ?Medication Dose Route Frequency Provider Last Rate Last Admin  ? sodium chloride flush (NS) 0.9 % injection 10 mL  10 mL Intracatheter PRN Magrinat, Virgie Dad, MD   10 mL at 09/19/18 1551  ? ? ?Allergies  ?Allergen Reactions  ? Genvoya [Elviteg-Cobic-Emtricit-Tenofaf] Hives  ? Lisinopril Cough  ? Tegaderm Ag Mesh [Silver] Itching  ? ? ?Past Medical History:  ?Diagnosis Date  ? Allergy   ?  Anemia   ? Normocytic  ? Antibiotic-induced yeast infection 09/28/2020  ? Anxiety   ? Asthma   ? Blood dyscrasia   ? Bronchitis 2005  ? Bursitis of left shoulder 09/06/2019  ? CKD (chronic kidney disease)   ? CLASS 1-EXOPHTHALMOS-THYROTOXIC 02/08/2007  ? Diabetes mellitus without complication ([place])   ? Family history of breast cancer   ? Family history of lung cancer   ? Family history of prostate cancer   ? Gastroenteritis 07/10/07  ? Genetic testing 07/25/2018  ? [employer] + RNA Insight was ordered.  Genes Analyzed (43 total): APC*, ATM*, AXIN2, BARD1, BMPR1A, BRCA1*, BRCA2*, BRIP1*, CDH1*, CDK4, CDKN2A, CHEK2*, DICER1, GALNT12, HOXB13, MEN1, MLH1*, MRE11A, MSH2*, MSH3, MSH6*, MUTYH*, NBN, NF1*, NTHL1, PALB2*, PMS2*, POLD1, POLE, PTEN*, RAD50, RAD51C*, RAD51D*, RET, SDHB, SDHD, SMAD4, SMARCA4, STK11 and TP53* (sequencing and deletion/duplication); EGFR (s  ? GERD 07/24/2006  ? GRAVE'S DISEASE 01/01/2008  ? History of hidradenitis suppurativa   ? History of kidney stones   ? History of thrush   ? HIV DISEASE 07/24/2006  ? dx March 05  ? Hyperlipidemia   ? HYPERTENSION 07/24/2006  ? Hyperthyroidism 08/2006  ? Grave's Disease -diffuse radiotracer uptake 08/25/06 Thyroid scan-Cold nodule to R lower lobe of thyrorid  ? Menometrorrhagia   ? hx of  ? Nephrolithiasis   ? Panniculitis 05/12/2020  ? 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  ? Personal history of chemotherapy   ? 2020  ? Personal history of radiatio

## 2022-02-01 NOTE — Patient Instructions (Signed)
Return for a a follow up appointment July 20 at 11 am ? ?Check your blood pressure at home daily and keep record of the readings. ? ?Take your BP meds as follows: ? Stop amlodipine ? Start hydralazine 50 mg twice daily ? ?Bring all of your meds, your BP cuff and your record of home blood pressures to your next appointment.  Exercise as you?re able, try to walk approximately 30 minutes per day.  Keep salt intake to a minimum, especially watch canned and prepared boxed foods.  Eat more fresh fruits and vegetables and fewer canned items.  Avoid eating in fast food restaurants.  ? ? HOW TO TAKE YOUR BLOOD PRESSURE: ?Rest 5 minutes before taking your blood pressure. ? Don?t smoke or drink caffeinated beverages for at least 30 minutes before. ?Take your blood pressure before (not after) you eat. ?Sit comfortably with your back supported and both feet on the floor (don?t cross your legs). ?Elevate your arm to heart level on a table or a desk. ?Use the proper sized cuff. It should fit smoothly and snugly around your bare upper arm. There should be enough room to slip a fingertip under the cuff. The bottom edge of the cuff should be 1 inch above the crease of the elbow. ?Ideally, take 3 measurements at one sitting and record the average. ? ? ?

## 2022-02-01 NOTE — Assessment & Plan Note (Signed)
Patient with resistant hypertension, has not been taking carvedilol for some time.  Will renew that today.  She complains of rash/itch from amlodipine, so will d/c that and start her on hydralazine 50 mg twice daily.  She should continue with other medications.  Asked that she bring home cuff to next appointment and return in 3 months for follow up.  ?

## 2022-02-02 ENCOUNTER — Other Ambulatory Visit (HOSPITAL_BASED_OUTPATIENT_CLINIC_OR_DEPARTMENT_OTHER): Payer: Self-pay | Admitting: *Deleted

## 2022-02-02 MED ORDER — CANDESARTAN CILEXETIL 32 MG PO TABS: 32.0000 mg | ORAL_TABLET | Freq: Every day | ORAL | 1 refills | Status: DC

## 2022-02-03 NOTE — Telephone Encounter (Signed)
Called patient to follow up and get her new insurance information. Left message on voicemail and informed patient to call back with the update. ?

## 2022-02-13 ENCOUNTER — Encounter: Payer: Self-pay | Admitting: Oncology

## 2022-03-01 ENCOUNTER — Encounter: Payer: Self-pay | Admitting: Internal Medicine

## 2022-03-01 ENCOUNTER — Ambulatory Visit (INDEPENDENT_AMBULATORY_CARE_PROVIDER_SITE_OTHER): Payer: Commercial Managed Care - HMO | Admitting: Internal Medicine

## 2022-03-01 VITALS — BP 148/100 | HR 62 | Ht 65.0 in | Wt 238.4 lb

## 2022-03-01 DIAGNOSIS — C73 Malignant neoplasm of thyroid gland: Secondary | ICD-10-CM | POA: Diagnosis not present

## 2022-03-01 DIAGNOSIS — E890 Postprocedural hypothyroidism: Secondary | ICD-10-CM

## 2022-03-01 LAB — T4, FREE: Free T4: 0.59 ng/dL — ABNORMAL LOW (ref 0.60–1.60)

## 2022-03-01 LAB — TSH: TSH: 29.49 u[IU]/mL — ABNORMAL HIGH (ref 0.35–5.50)

## 2022-03-01 MED ORDER — LEVOTHYROXINE SODIUM 175 MCG PO TABS: 175.0000 ug | ORAL_TABLET | Freq: Every day | ORAL | 3 refills | Status: DC

## 2022-03-01 NOTE — Patient Instructions (Addendum)
Please continue Levothyroxine 125 mcg daily for now. ? ?Take the thyroid hormone every day, with water, at least 30 minutes before breakfast, separated by at least 4 hours from: ?- acid reflux medications ?- calcium ?- iron ?- multivitamins ? ?Move multivitamins >4h later levothyroxine. ? ?Please stop at the lab. ? ?Please come back in 4 months ?

## 2022-03-01 NOTE — Progress Notes (Signed)
Patient ID: [name], female   DOB: 1966/03/13, 56 y.o.   MRN: [id number]  HPI  Consepcion Utt Tate is a 56 y.o.-year-old female, returning for follow-up for thyroid cancer and uncontrolled postsurgical hypothyroidism.  She previously saw Dr. Loanne Drilling, last visit 10/2021.  Interim history: Patient had RAI treatment since last visit with Dr. Loanne Drilling.   She does mention cough, but this is chronic.  She also has fatigue.  I reviewed her thyroid cancer hx. -Per Dr. Cordelia Pen note and review of the chart: 2007 thyr NM scan consistent with Graves disease, except for 1 cold area 2008 Dominant 2.5 cm solid nodule in the inferior right lobe, corresponding to cold defect on NM. 2008 thyroid bx was suspicious.  2009 thyroidectomy (right lobectomy) --pathol c/w Grave's dz, with adenomatous nodule.  2/18 CT Multiple enlarged bilateral cervical lymph nodes. Residual 10 mm nodule at the thyroidectomy bed with similar nodules superior to the thyroid on the left, concerning for residual or recurrent thyroid tissue. Neoplasm is not excluded.  Single low-density noted in the posterior left level 2 station of undetermined significance. 3/18 LYMPH NODE , FNA, LEFT CERVICAL, STRONGLY FAVORS METASTATIC PTC: LYMPH NODE , FINE NEEDLE ASPIRATION LEFT CERVICAL NECK (SPECIMEN 2 OF 2 COLLECTED 01/12/2017) METASTATIC CARCINOMA. SEE COMMENT. COMMENT: THE FINDINGS STRONGLY FAVOR METASTATIC PAPILLARY THYROID CARCINOMA. DR. Herbie Baltimore HILLARD HAS REVIEWED THE CASE AND CONCURS WITH THIS INTERPRETATION. DR. Redmond Baseman WAS PAGED ON 01/13/2017. Specimen Clinical Information Left cervical lymph node, Patient status post thyroidectomy 2008 Source Lymph Node, Fine Needle Aspiration, Left Cervical, (Specimen 2 of 2, collected on 01/12/17 ) 3/18 THYROID, FINE NEEDLE ASPIRATION, LEFT B[place]ESDA CATEGORY II: THYROID, FINE NEEDLE ASPIRATION, LEFT (SPECIMEN 1 OF 2 COLLECTED 01/12/2017) CONSISTENT WITH BENIGN FOLLICULAR TISSUE (BETHESDA  CATEGORY II). Specimen Clinical Information Nodule within bed of left thyroid, Post thyroidectomy 2008 Source Thyroid, Fine Needle Aspiration, Left, (Specimen 1 of 2, collected on 01/12/17 ) 5/18 NM scan: Expected tissue within the thyroid bed. There is a second adjacent smaller focus of less intense uptake.   6/18 left thyroid lobectomy (completion thyroidectomy), and LN's, Left zone 3 Neck Contents.  NO MALIGNANCY.  Dr. Melida Quitter: 1. Thyroid, lobectomy, Left - NODULAR HYPERPLASIA. - NO MALIGNANCY IDENTIFIED. 2. Lymph nodes, regional resection, Left zone 3 Neck Contents - BENIGN THYROID WITH NODULAR HYPERPLASIA. - NO LYMPH NODE TISSUE IDENTIFIED. - NO MALIGNANCY IDENTIFIED. 8/18 NM scan: abnormal focus in the right side of neck, suspicious for nodal metastasis.   9/18 [place]: 1 non pathologically enlarged lymph node.  10/21 [place]: normal (thyroidect) 9/22 US neck LN's  01/06/2022: RAI treatment with 108.2 mCi I-131 01/13/2022: Posttreatment whole-body scan: Focus of abnormal tracer in the right superior cervical region likely a lymph node metastasis  Pt denies: - feeling nodules in neck - hoarseness - dysphagia - choking  For her postsurgical hypothyroidism, she was on LT4 125 mcg (not taking 2x at a time as recommended!), taken: - daily, no missed doses - fasting - with water - separated by 30 min-1h from b'fast  - no calcium, iron - + multivitamins 30 min to 1h after LT4 - + Omeprazole at bedtime  I reviewed pt's thyroid tests: Lab Results  Component Value Date   TSH 54.58 (H) 11/02/2021   TSH 23.96 (H) 07/01/2021   TSH 20.04 (H) 04/30/2021   TSH 6.46 (H) 07/21/2020   TSH 17.400 (H) 01/27/2020   TSH 17.98 (H) 07/11/2019   TSH 34.55 (H) 06/05/2019   TSH 2.69 08/24/2018  TSH 7.97 (H) 03/08/2018   TSH 0.22 (L) 10/12/2017   FREET4 0.55 (L) 11/02/2021   FREET4 0.75 07/01/2021   FREET4 1.0 04/30/2021   FREET4 1.05 07/21/2020   FREET4 0.94 03/08/2018   FREET4 1.03  10/12/2017   FREET4 1.56 09/01/2017   FREET4 0.87 (L) 06/05/2007   FREET4 2.10 (H) 02/08/2007   FREET4 5.42 (H) 09/15/2006    Pt mentions: - fatigue - weight gain - cold intolerance - no constipation - dry skin - no hair loss  She has + FH of thyroid disorders in: sister. No FH of thyroid cancer.  No h/o radiation tx to head or neck besides RAI treatment.  She also has a history of HIV, BrCA - s/p RxTx, DM2, HL, sarcoidosis.  ROS: + see HPI  Past Medical History:  Diagnosis Date   Allergy    Anemia    Normocytic   Antibiotic-induced yeast infection 09/28/2020   Anxiety    Asthma    Blood dyscrasia    Bronchitis 2005   Bursitis of left shoulder 09/06/2019   CKD (chronic kidney disease)    CLASS 1-EXOPHTHALMOS-THYROTOXIC 02/08/2007   Diabetes mellitus without complication (HCC)    Family history of breast cancer    Family history of lung cancer    Family history of prostate cancer    Gastroenteritis 07/10/07   Genetic testing 07/25/2018   [employer] + RNA Insight was ordered.  Genes Analyzed (43 total): APC*, ATM*, AXIN2, BARD1, BMPR1A, BRCA1*, BRCA2*, BRIP1*, CDH1*, CDK4, CDKN2A, CHEK2*, DICER1, GALNT12, HOXB13, MEN1, MLH1*, MRE11A, MSH2*, MSH3, MSH6*, MUTYH*, NBN, NF1*, NTHL1, PALB2*, PMS2*, POLD1, POLE, PTEN*, RAD50, RAD51C*, RAD51D*, RET, SDHB, SDHD, SMAD4, SMARCA4, STK11 and TP53* (sequencing and deletion/duplication); EGFR (s   GERD 07/24/2006   GRAVE'S DISEASE 01/01/2008   History of hidradenitis suppurativa    History of kidney stones    History of thrush    HIV DISEASE 07/24/2006   dx March 05   Hyperlipidemia    HYPERTENSION 07/24/2006   Hyperthyroidism 08/2006   Grave's Disease -diffuse radiotracer uptake 08/25/06 Thyroid scan-Cold nodule to R lower lobe of thyrorid   Menometrorrhagia    hx of   Nephrolithiasis    Panniculitis 05/12/2020   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   Personal history of chemotherapy    2020   Personal history of radiation therapy    2020   Pneumonia 2005   Port-A-Cath in place 07/12/2018   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   Sarcoidosis 02/08/2007   dx as a teenager in [place] from abnl CXR. Completed 2 yrs Prednisone after lung bx confirmation. No symptoms since then.   Suppurative hidradenitis    Thyroid cancer ([place])    THYROID NODULE, RIGHT 02/08/2007   Past Surgical History:  Procedure Laterality Date   APPLICATION OF WOUND VAC N/A 01/20/2021   Procedure: APPLICATION OF WOUND VAC;  Surgeon: Wallace Going, DO;  Location: [facility];  Service: Plastics;  Laterality: N/A;   BREAST EXCISIONAL BIOPSY Right 04/26/2018   right axilla negative   BREAST EXCISIONAL BIOPSY Left 04/26/2018   left axilla negative   BREAST LUMPECTOMY Right 10/03/2018   malignant   BREAST LUMPECTOMY WITH RADIOACTIVE SEED AND SENTINEL LYMPH NODE BIOPSY Right 10/03/2018   Procedure: RIGHT BREAST LUMPECTOMY WITH RADIOACTIVE SEED AND SENTINEL LYMPH NODE MAPPING;  Surgeon: Erroll Luna,  MD;  Location: [facility];  Service: General;  Laterality: Right;   BREAST SURGERY  1997   Breast Reduction    CYSTOSCOPY W/ URETERAL STENT REMOVAL  11/09/2012   Procedure: CYSTOSCOPY WITH STENT REMOVAL;  Surgeon: Alexis Frock, MD;  Location: [facility];  Service: Urology;  Laterality: Right;   CYSTOSCOPY WITH RETROGRADE PYELOGRAM, URETEROSCOPY AND STENT PLACEMENT  11/09/2012   Procedure: CYSTOSCOPY WITH RETROGRADE PYELOGRAM, URETEROSCOPY AND STENT PLACEMENT;  Surgeon: Alexis Frock, MD;  Location: [facility];  Service: Urology;  Laterality: Left;  LEFT URETEROSCOPY, STONE MANIPULATION, left STENT exchange    CYSTOSCOPY WITH STENT PLACEMENT  10/02/2012   Procedure: CYSTOSCOPY WITH STENT PLACEMENT;  Surgeon: Alexis Frock, MD;  Location: [facility];  Service:  Urology;  Laterality: Left;   DEBRIDEMENT AND CLOSURE WOUND N/A 01/20/2021   Procedure: Excision of abdominal wound with closure;  Surgeon: Wallace Going, DO;  Location: [place] [facility];  Service: Plastics;  Laterality: N/A;   DILATION AND CURETTAGE OF UTERUS  Feb 2004   s/p for 1st trimester nonviable pregnancy   EYE SURGERY     sty under eyelid   INCISE AND DRAIN ABCESS  Nov 03   s/p I &D for righ inframmary fold hidradenitis   INCISION AND DRAINAGE PERITONSILLAR ABSCESS  Mar 03   IR CV LINE INJECTION  06/07/2018   IR IMAGING GUIDED PORT INSERTION  06/20/2018   IR REMOVAL TUN ACCESS W/ PORT W/O FL MOD SED  06/20/2018   IRRIGATION AND DEBRIDEMENT ABSCESS  01/31/2012   Procedure: IRRIGATION AND DEBRIDEMENT ABSCESS;  Surgeon: Shann Medal, MD;  Location: [facility];  Service: General;  Laterality: Right;  right breast and axilla    NEPHROLITHOTOMY  10/02/2012   Procedure: NEPHROLITHOTOMY PERCUTANEOUS;  Surgeon: Alexis Frock, MD;  Location[facility];  Service: Urology;  Laterality: Right;  First Stage Percutaneous Nephrolithotomy with Surgeon Access, Left Ureteral Stent     NEPHROLITHOTOMY  10/04/2012   Procedure: NEPHROLITHOTOMY PERCUTANEOUS SECOND LOOK;  Surgeon: Alexis Frock, MD;  Location[facility];  Service: Urology;  Laterality: Right;      NEPHROLITHOTOMY  10/08/2012   Procedure: NEPHROLITHOTOMY PERCUTANEOUS;  Surgeon: Alexis Frock, MD;  Location[facility];  Service: Urology;  Laterality: Right;  THIRD STAGE, nephrostomy tube exchange x 2   NEPHROLITHOTOMY  10/11/2012   Procedure: NEPHROLITHOTOMY PERCUTANEOUS SECOND LOOK;  Surgeon: Alexis Frock, MD;  Location[facility];  Service: Urology;  Laterality: Right;  RIGHT 4 STAGE PERCUTANOUS NEPHROLITHOTOMY, right URETEROSCOPY WITH HOLMIUM LASER    PANNICULECTOMY N/A 12/21/2020   Procedure: PANNICULECTOMY;  Surgeon: Wallace Going, DO;  Location: [place];  Service: Plastics;  Laterality: N/A;   PORT-A-CATH REMOVAL N/A 07/16/2020    Procedure: REMOVAL PORT-A-CATH;  Surgeon: Erroll Luna, MD;  Location: [place];  Service: General;  Laterality: N/A;   PORTACATH PLACEMENT Left 05/17/2018   Procedure: INSERTION PORT-A-CATH;  Surgeon: Coralie Keens, MD;  Location: [facility];  Service: General;  Laterality: Left;   RADICAL NECK DISSECTION  04/12/2017   limited/notes 04/12/2017   RADICAL NECK DISSECTION N/A 04/12/2017   Procedure: RADICAL NECK DISSECTION;  Surgeon: Melida Quitter, MD;  Location: [facility];  Service: ENT;  Laterality: N/A;  limited neck dissection 2 hours total   REDUCTION MAMMAPLASTY Bilateral 1998   RIGHT/LEFT HEART CATH AND CORONARY ANGIOGRAPHY N/A 03/12/2020   Procedure: RIGHT/LEFT HEART CATH AND CORONARY ANGIOGRAPHY;  Surgeon: Jolaine Artist, MD;  Location: [facility];  Service: Cardiovascular;  Laterality: N/A;   [place]  04/12/2017   completion/notes 04/12/2017   THYROIDECTOMY N/A 04/12/2017   Procedure: THYROIDECTOMY;  Surgeon: Melida Quitter, MD;  Location: [place] [facility];  Service: ENT;  Laterality: N/A;  Completion Thyroidectomy   TOTAL THYROIDECTOMY  2010   Social History   Socioeconomic History   Marital status: Single    Spouse name: Not on file   Number of children: 1   Years of education: Not on file   Highest education level: Not on file  Occupational History   Occupation: Secondary school teacher  Tobacco Use   Smoking status: Former    Packs/day: 0.50    Years: 15.00    Pack years: 7.50    Types: Cigarettes    Start date: 04/12/2017    Quit date: 2019    Years since quitting: 4.3   Smokeless tobacco: Never  Vaping Use   Vaping Use: Never used  Substance and Sexual Activity   Alcohol use: Yes    Alcohol/week: 0.0 standard drinks    Comment: social   Drug use: No   Sexual activity: Not Currently    Birth control/protection: Post-menopausal    Comment: declined condoms  Other Topics Concern   Not on file  Social History  Narrative   Right Handed    Lives in a two story home   Social Determinants of Health   Financial Resource Strain: Not on file  Food Insecurity: Not on file  Transportation Needs: Not on file  Physical Activity: Not on file  Stress: Not on file  Social Connections: Not on file  Intimate Partner Violence: Not on file   Current Outpatient Medications on File Prior to Visit  Medication Sig Dispense Refill   acetaminophen (TYLENOL) 500 MG tablet Take 1,000 mg by mouth every 6 (six) hours as needed for mild pain.     albuterol (VENTOLIN HFA) 108 (90 Base) MCG/ACT inhaler Inhale 2 puffs into the lungs every 6 (six) hours as needed for wheezing or shortness of breath. 8 g 2   allopurinol (ZYLOPRIM) 100 MG tablet TAKE 1 TABLET BY MOUTH EVERY DAY FOR GOUT PREVENTION 90 tablet 1   baclofen (LIORESAL) 10 MG tablet Take 10 mg by mouth 3 (three) times daily as needed for muscle spasms.     Blood Glucose Monitoring Suppl (FREESTYLE LITE) w/Device KIT Inject 1 each into the skin daily. 1 kit 0   candesartan (ATACAND) 32 MG tablet Take 1 tablet (32 mg total) by mouth daily. 90 tablet 1   carvedilol (COREG) 25 MG tablet Take 1 tablet (25 mg total) by mouth 2 (two) times daily. 180 tablet 3   cetirizine (ZYRTEC) 10 MG tablet Take 1 tablet by mouth at bedtime for allergies. 90 tablet 3   dolutegravir ([name]) 50 MG tablet Take 1 tablet (50 mg total) by mouth daily. 30 tablet 11   emtricitabine-tenofovir AF (DESCOVY) 200-25 MG tablet Take 1 tablet by mouth daily. 30 tablet 11   FLOVENT HFA 110 MCG/ACT inhaler TAKE 1 PUFF BY MOUTH TWICE A DAY (Patient taking differently: Inhale 1 puff into the lungs 2 (two) times daily as needed (wheezing/shortness of breath).) 36 Inhaler 1   fluticasone (FLONASE) 50 MCG/ACT nasal spray Place 2 sprays into both nostrils daily. 16 g 6   gabapentin (NEURONTIN) 300 MG capsule Take 300 mg by mouth daily.     glipiZIDE (GLUCOTROL) 10 MG tablet Take 1 tablet (10 mg total) by  mouth 2 (two) times  daily. For diabetes. 180 tablet 1   glucose blood (ONETOUCH VERIO) test strip USE AS INSTRUCTED TO TEST BLOOD SUGAR up to 3 times daily. 300 each 3   hydrALAZINE (APRESOLINE) 50 MG tablet Take 1 tablet (50 mg total) by mouth in the morning and at bedtime. 60 tablet 6   hydrOXYzine (ATARAX) 10 MG tablet Take 1 tablet (10 mg total) by mouth 2 (two) times daily as needed for itching. 30 tablet 0   Lancets (ONETOUCH ULTRASOFT) lancets Use as instructed to test blood sugar daily 100 each 5   levothyroxine (SYNTHROID) 125 MCG tablet Take 2 tablets (250 mcg total) by mouth daily. (Patient taking differently: Take 125 mcg by mouth daily.) 180 tablet 3   omeprazole (PRILOSEC) 40 MG capsule TAKE 1 CAPSULE BY MOUTH DAILY FOR REFLUX 90 capsule 3   ondansetron (ZOFRAN-ODT) 4 MG disintegrating tablet Take 1 tablet (4 mg total) by mouth every 8 (eight) hours as needed for nausea or vomiting. 20 tablet 0   rosuvastatin (CRESTOR) 10 MG tablet TAKE 1 TABLET BY MOUTH EVERY DAY FOR CHOLESTEROL 90 tablet 0   sertraline (ZOLOFT) 50 MG tablet TAKE 1 TABLET (50 MG TOTAL) BY MOUTH DAILY. FOR ANXIETY AND DEPRESSION. 90 tablet 3   spironolactone (ALDACTONE) 25 MG tablet Take 1 tablet (25 mg total) by mouth daily. 90 tablet 3   tamoxifen (NOLVADEX) 20 MG tablet Take 1 tablet (20 mg total) by mouth daily. 90 tablet 4   [DISCONTINUED] prochlorperazine (COMPAZINE) 10 MG tablet TAKE 1 TABLET BY MOUTH EVERY 6 HOURS AS NEEDED FOR NAUSEA OR VOMITING 30 tablet 2   Current Facility-Administered Medications on File Prior to Visit  Medication Dose Route Frequency Provider Last Rate Last Admin   sodium chloride flush (NS) 0.9 % injection 10 mL  10 mL Intracatheter PRN Magrinat, Virgie Dad, MD   10 mL at 09/19/18 1551   Allergies  Allergen Reactions   Genvoya [Elviteg-Cobic-Emtricit-Tenofaf] Hives   Lisinopril Cough   Tegaderm Ag Mesh [Silver] Itching   Family History  Problem Relation Age of Onset   Hypertension  Mother    Cancer Mother        laryngeal   Heart disease Mother        stent   Hypertension Father    Lung cancer Father 46       hx smoking   Heart disease Other    Hypertension Other    Stroke Other        Grandparent   Kidney disease Other        Grandparent   Diabetes Other        FH of Diabetes   Hypertension Sister    Cancer Maternal Uncle        Lung CA   Hypertension Brother    Hypertension Sister    Breast cancer Maternal Aunt 65   Breast cancer Paternal Aunt 18   Prostate cancer Paternal Uncle    Breast cancer Maternal Aunt        dx 60+   Breast cancer Paternal Aunt        dx 2's   Breast cancer Paternal Aunt        dx 50's   Prostate cancer Paternal Uncle    Lung cancer Paternal Uncle    Breast cancer Cousin 37   Breast cancer Cousin        dx <50   Breast cancer Cousin        dx <50  Breast cancer Cousin        dx <50   Colon cancer Neg Hx    Esophageal cancer Neg Hx    Rectal cancer Neg Hx    Stomach cancer Neg Hx     PE: BP (!) 148/100 (BP Location: Left Arm, Patient Position: Sitting, Cuff Size: Normal)   Pulse 62   Ht _0  (1.651 m)   Wt 238 lb 6.4 oz (108.1 kg)   LMP 03/31/2014 (LMP Unknown)   SpO2 99%   BMI 39.67 kg/m  Wt Readings from Last 3 Encounters:  03/01/22 238 lb 6.4 oz (108.1 kg)  02/01/22 231 lb 3.2 oz (104.9 kg)  01/27/22 230 lb (104.3 kg)   Constitutional: overweight, in NAD Eyes: PERRLA, EOMI, no exophthalmos ENT: moist mucous membranes, no neck masses palpated, thyroidectomy scar inconspicuous, no cervical lymphadenopathy Cardiovascular: RRR, No MRG Respiratory: CTA B Musculoskeletal: no deformities, strength intact in all 4 Skin: moist, warm, no rashes Neurological: no tremor with outstretched hands, DTR normal in all 4  ASSESSMENT: 1. Thyroid cancer - see HPI  2. Postsurgical Hypothyroidism -Uncontrolled  PLAN:  1. Thyroid cancer - papillary - pt. with an interesting long history of metastatic papillary  thyroid cancer, detected in cervical lymph nodes, without thyroid primary tumor found - she initially had right lobectomy in 2009 which was negative for thyroid cancer.  After having had a lymph node biopsy showing PTC metastasis in the left lateral neck, she had a completion thyroidectomy by Dr. Redmond Baseman in 2018.  The final pathology showed no malignancy. - she had RAI treatment 1.5 months ago and a posttreatment whole-body scan showed focus of abnormal tracer in the right superior cervical neck, consistent with a lymph node metastasis - We discussed that her RAI treatment may have ablated this focus of metastasis - for now, my suggestion is to follow her with thyroglobulin and see if we need further imaging if the thyroglobulin is positive. - we will check a thyroglobulin and ATA antibodies.  This will serve as a baseline.  We discussed that the trend, rather than the absolute value is valuable we will follow the thyroglobulin. - also, plan to get another whole-body scan a year after the previous. - I will then see the patient in approximately 4 months  2.  Patient with h/o total thyroidectomy for cancer, now with iatrogenic hypothyroidism, on levothyroxine therapy.  - she was supposed to be on 250 mcg levothyroxine daily, however, she is taking only one of the 2 tablets of 125 mcg daily as she misunderstood the instructions... - last TSH was very high: Lab Results  Component Value Date   TSH 54.58 (H) 11/02/2021  - We discussed about correct intake of levothyroxine, fasting, with water, separated by at least 30 minutes from breakfast, and separated by more than 4 hours from calcium, iron, multivitamins, acid reflux medications (PPIs).  Pt. takes her vitamins too close to levothyroxine and I advised her to lose at least 4 hours later. - will check thyroid tests today: TSH, free T4 and change the thyroid hormone dose accordingly.  I explained why we need to keep the TSH low, since high levels will  stimulate the growth of the cancer. - we discussed that our target TSH is in the lower part of the normal interval - If these are abnormal, she will need to return in 5-6 weeks for repeat labs - If these are normal, we will recheck them at next visit  - Total time  spent for the visit: 40 min, in precharting, obtaining medical information from the patient and from the chart, reviewing Dr. Cordelia Pen last note, reviewing her  previous labs, imaging evaluations, and treatments, reviewing her symptoms, counseling her about her conditions (please see the discussed topics above), and developing a plan to further investigate and treat them.  Component     Latest Ref Rng 03/01/2022  TSH     0.35 - 5.50 uIU/mL 29.49 (H)   T4,Free(Direct)     0.60 - 1.60 ng/dL 0.59 (L)   Thyroglobulin     ng/mL 2.2 (L)   Comment --   Thyroglobulin Ab     < or = 1 IU/mL <1   TSH is elevated, showing increased levothyroxine to 175 mcg daily and we will recheck the test in 1.5 months.  Thyroglobulin is low, while ATA antibodies are undetectable.  We will continue to follow this as discussed above.  Philemon Kingdom, MD PhD [facility]

## 2022-03-02 ENCOUNTER — Ambulatory Visit: Payer: 59 | Admitting: Endocrinology

## 2022-03-03 LAB — THYROGLOBULIN ANTIBODY: Thyroglobulin Ab: <1 IU/mL (ref <=1)

## 2022-03-03 LAB — THYROGLOBULIN LEVEL: Thyroglobulin: 2.2 ng/mL — ABNORMAL LOW

## 2022-03-28 ENCOUNTER — Other Ambulatory Visit: Payer: Self-pay | Admitting: Primary Care

## 2022-03-28 DIAGNOSIS — E785 Hyperlipidemia, unspecified: Secondary | ICD-10-CM

## 2022-04-16 HISTORY — PX: PERCUTANEOUS NEPHROSTOLITHOTOMY: SHX2207

## 2022-04-24 ENCOUNTER — Other Ambulatory Visit: Payer: Self-pay | Admitting: Cardiovascular Disease

## 2022-04-28 ENCOUNTER — Ambulatory Visit (INDEPENDENT_AMBULATORY_CARE_PROVIDER_SITE_OTHER): Payer: Commercial Managed Care - HMO | Admitting: Primary Care

## 2022-04-28 ENCOUNTER — Encounter: Payer: Self-pay | Admitting: Primary Care

## 2022-04-28 ENCOUNTER — Encounter: Payer: Self-pay | Admitting: Oncology

## 2022-04-28 VITALS — BP 130/72 | HR 76 | Temp 98.6°F | Ht 65.0 in | Wt 224.0 lb

## 2022-04-28 DIAGNOSIS — U071 COVID-19: Secondary | ICD-10-CM | POA: Insufficient documentation

## 2022-04-28 DIAGNOSIS — E119 Type 2 diabetes mellitus without complications: Secondary | ICD-10-CM

## 2022-04-28 HISTORY — DX: COVID-19: U07.1

## 2022-04-28 LAB — POC COVID19 BINAXNOW: SARS Coronavirus 2 Ag: POSITIVE — AB

## 2022-04-28 LAB — POCT GLYCOSYLATED HEMOGLOBIN (HGB A1C): Hemoglobin A1C: 8 % — AB (ref 4.0–5.6)

## 2022-04-28 MED ORDER — GUAIFENESIN-CODEINE 100-10 MG/5ML PO SYRP: 5.0000 mL | ORAL_SOLUTION | Freq: Three times a day (TID) | ORAL | 0 refills | Status: DC | PRN

## 2022-04-28 MED ORDER — PEN NEEDLES 31G X 6 MM MISC: 2 refills | Status: DC

## 2022-04-28 MED ORDER — MOLNUPIRAVIR EUA 200MG CAPSULE: 4.0000 | ORAL_CAPSULE | Freq: Two times a day (BID) | ORAL | 0 refills | Status: AC

## 2022-04-28 MED ORDER — BASAGLAR KWIKPEN 100 UNIT/ML ~~LOC~~ SOPN: 10.0000 [IU] | PEN_INJECTOR | Freq: Every day | SUBCUTANEOUS | 0 refills | Status: DC

## 2022-04-28 MED ORDER — PREDNISONE 20 MG PO TABS: ORAL_TABLET | ORAL | 0 refills | Status: DC

## 2022-04-28 NOTE — Assessment & Plan Note (Signed)
Symptom onset 2 days ago. COVID-19 test positive today in the office.  She is within the window for antiviral treatment.  Given her history of CKD we will proceed with molnupiravir.  Start molnupiravir, take 4 tablets by mouth twice daily for 5 days.  We discussed quarantine and other precautions.  Start prednisone 40 mg daily x5 days.  She does appear stable in the office today, however we did discuss ED precautions.

## 2022-04-28 NOTE — Addendum Note (Signed)
Addended by: Francella Solian on: 04/28/2022 12:33 PM   Modules accepted: Orders

## 2022-04-28 NOTE — Patient Instructions (Addendum)
Start Basaglar insulin. Inject 10 units into the skin every evening at bedtime for diabetes.   Pick up the pen needles that attach to the insulin pen.  Start molnupiravir antiviral medication for COVID-19 infection.  Take 4 capsules by mouth twice daily for 5 days.  You may take the cough medicine 3 times daily as needed.  This may cause drowsiness.  Please notify your surgeon that you have COVID-19.  Please update me regarding your blood sugars in a few weeks.  We will plan to see you back in 3 months.   It was a pleasure to see you today!

## 2022-04-28 NOTE — Assessment & Plan Note (Addendum)
Uncontrolled with A1C of 8.0.  Unfortunately, she is not a candidate for other oral agents given her significant medical history including CKD, thyroid cancer, pancreatitis.  Continue Glipizide 10 mg BID. Start Basaglar 10 units HS.  She will continue checking glucose levels and update in a few weeks.  Follow up in 3 months.

## 2022-04-28 NOTE — Progress Notes (Signed)
A 

## 2022-04-28 NOTE — Progress Notes (Signed)
Subjective:    Patient ID: [name], female    DOB: 1965/11/27, 56 y.o.   MRN: [id number]  Diabetes Hypoglycemia symptoms include headaches. Associated symptoms include fatigue.  Cough Associated symptoms include headaches, postnasal drip and shortness of breath. Pertinent negatives include no chills, ear pain or fever.    [name] is a very pleasant 56 y.o. female with a significant medical history including type 2 diabetes, hypertension, postsurgical hypothyroidism, thyroid cancer, breast cancer, CKD, HIV, tobacco abuse, chronic gout, pancreatitis who presents today for follow up of diabetes. She would also like to discuss acute cough.  1) Type 2 Diabetes: Current medications include: Glipizide 10 mg BID. She is not a candidate for metformin given her CKD.  She is not a candidate for GLP-1 antagonist given her history of pancreatitis and thyroid cancer.  She is not a candidate for SGL 2 treatment given her CKD.  She is checking her blood glucose 5-6 times daily and is getting readings of 110's to low 200's.  Last A1C: 7.9 in April 2023, 8.0 today Last Eye Exam: Due Last Foot Exam: UTD Pneumonia Vaccination: 2019 Urine Microalbumin: Managed on ARB Statin: rosuvastatin   Dietary changes since last visit: She has cut back on processed foods. Increased vegetables, fruit. She's been using her air fryer more frequently.   She has noticed increased neuropathy symptoms to her bilateral lower extremities. She is managed on gabapentin 300 mg daily but she ran out about 1 month ago.    Exercise: None  2) Cough: Symptom onset two days ago with sneezing. Symptoms now include headache, cough, chest congestion, chest tightness, fatigue, sinus pressure, rhinorrhea, sneezing, increased sputum production, increased dyspnea. She denies fevers, chills, body aches. Prior to symptom onset she was at her mothers house which was very hot. She is a prior smoker, quit in  2019.  She's been taking Emergen C, Rx cough syrup from an older prescription, and Neti Pot rinses without improvement. She tested negative for Covid-19 yesterday with a home test.   She denies sick contacts.   Review of Systems  Constitutional:  Positive for fatigue. Negative for chills and fever.  HENT:  Positive for congestion, postnasal drip, sinus pressure and sneezing. Negative for ear pain.   Respiratory:  Positive for cough, chest tightness and shortness of breath.   Neurological:  Positive for headaches.         Past Medical History:  Diagnosis Date   Allergy    Anemia    Normocytic   Antibiotic-induced yeast infection 09/28/2020   Anxiety    Asthma    Blood dyscrasia    Bronchitis 2005   Bursitis of left shoulder 09/06/2019   CKD (chronic kidney disease)    CLASS 1-EXOPHTHALMOS-THYROTOXIC 02/08/2007   Diabetes mellitus without complication (HCC)    Family history of breast cancer    Family history of lung cancer    Family history of prostate cancer    Gastroenteritis 07/10/07   Genetic testing 07/25/2018   [employer] + RNA Insight was ordered.  Genes Analyzed (43 total): APC*, ATM*, AXIN2, BARD1, BMPR1A, BRCA1*, BRCA2*, BRIP1*, CDH1*, CDK4, CDKN2A, CHEK2*, DICER1, GALNT12, HOXB13, MEN1, MLH1*, MRE11A, MSH2*, MSH3, MSH6*, MUTYH*, NBN, NF1*, NTHL1, PALB2*, PMS2*, POLD1, POLE, PTEN*, RAD50, RAD51C*, RAD51D*, RET, SDHB, SDHD, SMAD4, SMARCA4, STK11 and TP53* (sequencing and deletion/duplication); EGFR (s   GERD 07/24/2006   GRAVE'S DISEASE 01/01/2008   History of hidradenitis suppurativa    History of kidney stones    History  of thrush    HIV DISEASE 07/24/2006   dx March 05   Hyperlipidemia    HYPERTENSION 07/24/2006   Hyperthyroidism 08/2006   Grave's Disease -diffuse radiotracer uptake 08/25/06 Thyroid scan-Cold nodule to R lower lobe of thyrorid   Menometrorrhagia    hx of   Nephrolithiasis    Panniculitis 05/12/2020   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HCC)     METASTATIC PAPILLARY THYROID CARCINOMA per 01/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   Personal history of chemotherapy    2020   Personal history of radiation therapy    2020   Pneumonia 2005   Port-A-Cath in place 07/12/2018   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   Sarcoidosis 02/08/2007   dx as a teenager in [place] from abnl CXR. Completed 2 yrs Prednisone after lung bx confirmation. No symptoms since then.   Suppurative hidradenitis    Thyroid cancer (HCC)    THYROID NODULE, RIGHT 02/08/2007    Social History   Socioeconomic History   Marital status: Single    Spouse name: Not on file   Number of children: 1   Years of education: Not on file   Highest education level: Not on file  Occupational History   Occupation: Investment banker, corporate  Tobacco Use   Smoking status: Former    Packs/day: 0.50    Years: 15.00    Total pack years: 7.50    Types: Cigarettes    Start date: 04/12/2017    Quit date: 2019    Years since quitting: 4.5   Smokeless tobacco: Never  Vaping Use   Vaping Use: Never used  Substance and Sexual Activity   Alcohol use: Yes    Alcohol/week: 0.0 standard drinks of alcohol    Comment: social   Drug use: No   Sexual activity: Not Currently    Birth control/protection: Post-menopausal    Comment: declined condoms  Other Topics Concern   Not on file  Social History Narrative   Right Handed    Lives in a two story home   Social Determinants of Health   Financial Resource Strain: Not on file  Food Insecurity: Not on file  Transportation Needs: No Transportation Needs (10/25/2018)   PRAPARE - Administrator, Civil Service (Medical): No    Lack of Transportation (Non-Medical): No  Physical Activity: Not on file  Stress: Not on file  Social Connections: Not on file  Intimate Partner Violence: Not At Risk (10/25/2018)   Humiliation, Afraid, Rape, and  Kick questionnaire    Fear of Current or Ex-Partner: No    Emotionally Abused: No    Physically Abused: No    Sexually Abused: No    Past Surgical History:  Procedure Laterality Date   APPLICATION OF WOUND VAC N/A 01/20/2021   Procedure: APPLICATION OF WOUND VAC;  Surgeon: Peggye Form, DO;  Location: [facility];  Service: Plastics;  Laterality: N/A;   BREAST EXCISIONAL BIOPSY Right 04/26/2018   right axilla negative   BREAST EXCISIONAL BIOPSY Left 04/26/2018   left axilla negative   BREAST LUMPECTOMY Right 10/03/2018   malignant   BREAST LUMPECTOMY WITH RADIOACTIVE SEED AND SENTINEL LYMPH NODE BIOPSY Right 10/03/2018   Procedure: RIGHT BREAST LUMPECTOMY WITH RADIOACTIVE SEED AND SENTINEL LYMPH NODE MAPPING;  Surgeon: Harriette Bouillon, MD;  Location: [facility];  Service: General;  Laterality: Right;   BREAST SURGERY  1997   Breast Reduction  CYSTOSCOPY W/ URETERAL STENT REMOVAL  11/09/2012   Procedure: CYSTOSCOPY WITH STENT REMOVAL;  Surgeon: Alexis Frock, MD;  Location: [facility];  Service: Urology;  Laterality: Right;   CYSTOSCOPY WITH RETROGRADE PYELOGRAM, URETEROSCOPY AND STENT PLACEMENT  11/09/2012   Procedure: CYSTOSCOPY WITH RETROGRADE PYELOGRAM, URETEROSCOPY AND STENT PLACEMENT;  Surgeon: Alexis Frock, MD;  Location: [facility];  Service: Urology;  Laterality: Left;  LEFT URETEROSCOPY, STONE MANIPULATION, left STENT exchange    CYSTOSCOPY WITH STENT PLACEMENT  10/02/2012   Procedure: CYSTOSCOPY WITH STENT PLACEMENT;  Surgeon: Alexis Frock, MD;  Location: [facility];  Service: Urology;  Laterality: Left;   DEBRIDEMENT AND CLOSURE WOUND N/A 01/20/2021   Procedure: Excision of abdominal wound with closure;  Surgeon: Wallace Going, DO;  Location: [place];  Service: Plastics;  Laterality: N/A;   DILATION AND CURETTAGE OF UTERUS  Feb 2004   s/p for 1st trimester nonviable pregnancy   EYE SURGERY     sty under eyelid   INCISE AND DRAIN ABCESS  Nov  03   s/p I &D for righ inframmary fold hidradenitis   INCISION AND DRAINAGE PERITONSILLAR ABSCESS  Mar 03   IR CV LINE INJECTION  06/07/2018   IR IMAGING GUIDED PORT INSERTION  06/20/2018   IR REMOVAL TUN ACCESS W/ PORT W/O FL MOD SED  06/20/2018   IRRIGATION AND DEBRIDEMENT ABSCESS  01/31/2012   Procedure: IRRIGATION AND DEBRIDEMENT ABSCESS;  Surgeon: Shann Medal, MD;  Location: [facility];  Service: General;  Laterality: Right;  right breast and axilla    NEPHROLITHOTOMY  10/02/2012   Procedure: NEPHROLITHOTOMY PERCUTANEOUS;  Surgeon: Alexis Frock, MD;  Location: [facility];  Service: Urology;  Laterality: Right;  First Stage Percutaneous Nephrolithotomy with Surgeon Access, Left Ureteral Stent     NEPHROLITHOTOMY  10/04/2012   Procedure: NEPHROLITHOTOMY PERCUTANEOUS SECOND LOOK;  Surgeon: Alexis Frock, MD;  Location: [facility];  Service: Urology;  Laterality: Right;      NEPHROLITHOTOMY  10/08/2012   Procedure: NEPHROLITHOTOMY PERCUTANEOUS;  Surgeon: Alexis Frock, MD;  Location: [facility];  Service: Urology;  Laterality: Right;  THIRD STAGE, nephrostomy tube exchange x 2   NEPHROLITHOTOMY  10/11/2012   Procedure: NEPHROLITHOTOMY PERCUTANEOUS SECOND LOOK;  Surgeon: Alexis Frock, MD;  Location: [facility];  Service: Urology;  Laterality: Right;  RIGHT 4 STAGE PERCUTANOUS NEPHROLITHOTOMY, right URETEROSCOPY WITH HOLMIUM LASER    PANNICULECTOMY N/A 12/21/2020   Procedure: PANNICULECTOMY;  Surgeon: Wallace Going, DO;  Location: [place];  Service: Plastics;  Laterality: N/A;   PORT-A-CATH REMOVAL N/A 07/16/2020   Procedure: REMOVAL PORT-A-CATH;  Surgeon: Erroll Luna, MD;  Location: [place];  Service: General;  Laterality: N/A;   PORTACATH PLACEMENT Left 05/17/2018   Procedure: INSERTION PORT-A-CATH;  Surgeon: Coralie Keens, MD;  Location: [place];  Service: General;  Laterality: Left;   RADICAL NECK DISSECTION  04/12/2017   limited/notes 04/12/2017   RADICAL NECK  DISSECTION N/A 04/12/2017   Procedure: RADICAL NECK DISSECTION;  Surgeon: Melida Quitter, MD;  Location: [place];  Service: ENT;  Laterality: N/A;  limited neck dissection 2 hours total   REDUCTION MAMMAPLASTY Bilateral 1998   RIGHT/LEFT HEART CATH AND CORONARY ANGIOGRAPHY N/A 03/12/2020   Procedure: RIGHT/LEFT HEART CATH AND CORONARY ANGIOGRAPHY;  Surgeon: Jolaine Artist, MD;  Location: [facility];  Service: Cardiovascular;  Laterality: N/A;   [place]  04/12/2017   completion/notes 04/12/2017   THYROIDECTOMY N/A 04/12/2017  Procedure: THYROIDECTOMY;  Surgeon: Melida Quitter, MD;  Location: [facility];  Service: ENT;  Laterality: N/A;  Completion Thyroidectomy   TOTAL THYROIDECTOMY  2010    Family History  Problem Relation Age of Onset   Hypertension Mother    Cancer Mother        laryngeal   Heart disease Mother        stent   Hypertension Father    Lung cancer Father 62       hx smoking   Heart disease Other    Hypertension Other    Stroke Other        Grandparent   Kidney disease Other        Grandparent   Diabetes Other        FH of Diabetes   Hypertension Sister    Cancer Maternal Uncle        Lung CA   Hypertension Brother    Hypertension Sister    Breast cancer Maternal Aunt 80   Breast cancer Paternal Aunt 69   Prostate cancer Paternal Uncle    Breast cancer Maternal Aunt        dx 60+   Breast cancer Paternal Aunt        dx 67's   Breast cancer Paternal Aunt        dx 50's   Prostate cancer Paternal Uncle    Lung cancer Paternal Uncle    Breast cancer Cousin 47   Breast cancer Cousin        dx <50   Breast cancer Cousin        dx <50   Breast cancer Cousin        dx <50   Colon cancer Neg Hx    Esophageal cancer Neg Hx    Rectal cancer Neg Hx    Stomach cancer Neg Hx     Allergies  Allergen Reactions   Genvoya [Elviteg-Cobic-Emtricit-Tenofaf] Hives   Lisinopril Cough   Tape Rash    Rash  Okay with tegaderm and paper  tape   Tegaderm Ag Mesh [Silver] Itching    Current Outpatient Medications on File Prior to Visit  Medication Sig Dispense Refill   acetaminophen (TYLENOL) 500 MG tablet Take 1,000 mg by mouth every 6 (six) hours as needed for mild pain.     albuterol (VENTOLIN HFA) 108 (90 Base) MCG/ACT inhaler Inhale 2 puffs into the lungs every 6 (six) hours as needed for wheezing or shortness of breath. 8 g 2   allopurinol (ZYLOPRIM) 100 MG tablet TAKE 1 TABLET BY MOUTH EVERY DAY FOR GOUT PREVENTION 90 tablet 1   Blood Glucose Monitoring Suppl (FREESTYLE LITE) w/Device KIT Inject 1 each into the skin daily. 1 kit 0   candesartan (ATACAND) 32 MG tablet Take 1 tablet (32 mg total) by mouth daily. 90 tablet 1   carvedilol (COREG) 25 MG tablet Take 1 tablet (25 mg total) by mouth 2 (two) times daily. 180 tablet 3   cetirizine (ZYRTEC) 10 MG tablet Take 1 tablet by mouth at bedtime for allergies. 90 tablet 3   dolutegravir ([name]) 50 MG tablet Take 1 tablet (50 mg total) by mouth daily. 30 tablet 11   emtricitabine-tenofovir AF (DESCOVY) 200-25 MG tablet Take 1 tablet by mouth daily. 30 tablet 11   FLOVENT HFA 110 MCG/ACT inhaler TAKE 1 PUFF BY MOUTH TWICE A DAY (Patient taking differently: Inhale 1 puff into the lungs 2 (two) times daily as needed (wheezing/shortness of breath).) 36  Inhaler 1   fluticasone (FLONASE) 50 MCG/ACT nasal spray Place 2 sprays into both nostrils daily. 16 g 6   gabapentin (NEURONTIN) 300 MG capsule Take 300 mg by mouth daily.     glipiZIDE (GLUCOTROL) 10 MG tablet Take 1 tablet (10 mg total) by mouth 2 (two) times daily. For diabetes. 180 tablet 1   glucose blood (ONETOUCH VERIO) test strip USE AS INSTRUCTED TO TEST BLOOD SUGAR up to 3 times daily. 300 each 3   hydrALAZINE (APRESOLINE) 50 MG tablet Take 1 tablet (50 mg total) by mouth in the morning and at bedtime. 60 tablet 6   hydrOXYzine (ATARAX) 10 MG tablet Take 1 tablet (10 mg total) by mouth 2 (two) times daily as needed for  itching. 30 tablet 0   Lancets (ONETOUCH ULTRASOFT) lancets Use as instructed to test blood sugar daily 100 each 5   levothyroxine (SYNTHROID) 175 MCG tablet Take 1 tablet (175 mcg total) by mouth daily. 60 tablet 3   omeprazole (PRILOSEC) 40 MG capsule TAKE 1 CAPSULE BY MOUTH DAILY FOR REFLUX 90 capsule 3   rosuvastatin (CRESTOR) 10 MG tablet TAKE 1 TABLET BY MOUTH EVERY DAY FOR CHOLESTEROL 90 tablet 0   spironolactone (ALDACTONE) 25 MG tablet Take 1 tablet (25 mg total) by mouth daily. 90 tablet 3   tamoxifen (NOLVADEX) 20 MG tablet Take 1 tablet (20 mg total) by mouth daily. 90 tablet 4   sertraline (ZOLOFT) 50 MG tablet TAKE 1 TABLET (50 MG TOTAL) BY MOUTH DAILY. FOR ANXIETY AND DEPRESSION. (Patient not taking: Reported on 04/28/2022) 90 tablet 3   [DISCONTINUED] prochlorperazine (COMPAZINE) 10 MG tablet TAKE 1 TABLET BY MOUTH EVERY 6 HOURS AS NEEDED FOR NAUSEA OR VOMITING 30 tablet 2   Current Facility-Administered Medications on File Prior to Visit  Medication Dose Route Frequency Provider Last Rate Last Admin   sodium chloride flush (NS) 0.9 % injection 10 mL  10 mL Intracatheter PRN Magrinat, Virgie Dad, MD   10 mL at 09/19/18 1551    BP 130/72   Pulse 76   Temp 98.6 F (37 C) (Oral)   Ht $R'5\' 5"'Pq$  (1.651 m)   Wt 224 lb (101.6 kg)   LMP 03/31/2014 (LMP Unknown)   SpO2 98%   BMI 37.28 kg/m  Objective:   Physical Exam Constitutional:      Appearance: She is ill-appearing.  HENT:     Right Ear: Tympanic membrane and ear canal normal.     Left Ear: Tympanic membrane and ear canal normal.     Nose:     Right Sinus: No maxillary sinus tenderness or frontal sinus tenderness.     Left Sinus: No maxillary sinus tenderness or frontal sinus tenderness.  Eyes:     Conjunctiva/sclera: Conjunctivae normal.  Cardiovascular:     Rate and Rhythm: Normal rate and regular rhythm.  Pulmonary:     Effort: Pulmonary effort is normal.     Breath sounds: Normal breath sounds. No wheezing or rales.      Comments: Congested cough noted several times during visit Musculoskeletal:     Cervical back: Neck supple.  Lymphadenopathy:     Cervical: No cervical adenopathy.  Skin:    General: Skin is warm and dry.           Assessment & Plan:   Problem List Items Addressed This Visit       Endocrine   Non-insulin dependent type 2 diabetes mellitus ([place]) - Primary    Uncontrolled with  A1C of 8.0.  Unfortunately, she is not a candidate for other oral agents given her significant medical history including CKD, thyroid cancer, pancreatitis.  Continue Glipizide 10 mg BID. Start Basaglar 10 units HS.  She will continue checking glucose levels and update in a few weeks.  Follow up in 3 months.      Relevant Medications   Insulin Glargine (BASAGLAR KWIKPEN) 100 UNIT/ML   Insulin Pen Needle (PEN NEEDLES) 31G X 6 MM MISC   Other Relevant Orders   POCT glycosylated hemoglobin (Hb A1C) (Completed)     Other   COVID-19 virus infection    Symptom onset 2 days ago. COVID-19 test positive today in the office.  She is within the window for antiviral treatment.  Given her history of CKD we will proceed with molnupiravir.  Start molnupiravir, take 4 tablets by mouth twice daily for 5 days.  We discussed quarantine and other precautions.  Start prednisone 40 mg daily x5 days.  She does appear stable in the office today, however we did discuss ED precautions.      Relevant Medications   molnupiravir EUA (LAGEVRIO) 200 mg CAPS capsule   predniSONE (DELTASONE) 20 MG tablet   guaiFENesin-codeine (ROBITUSSIN AC) 100-10 MG/5ML syrup       [name], NP

## 2022-04-29 ENCOUNTER — Other Ambulatory Visit: Payer: Self-pay | Admitting: Primary Care

## 2022-04-29 ENCOUNTER — Other Ambulatory Visit: Payer: Self-pay | Admitting: Internal Medicine

## 2022-04-29 DIAGNOSIS — M1A09X0 Idiopathic chronic gout, multiple sites, without tophus (tophi): Secondary | ICD-10-CM

## 2022-04-29 DIAGNOSIS — L732 Hidradenitis suppurativa: Secondary | ICD-10-CM

## 2022-05-02 NOTE — Telephone Encounter (Signed)
Please advise, patient was taking for recurrent boils, but it was discontinued by another office.   Beryle Flock, RN

## 2022-05-03 ENCOUNTER — Other Ambulatory Visit: Payer: Self-pay | Admitting: Internal Medicine

## 2022-05-03 DIAGNOSIS — L732 Hidradenitis suppurativa: Secondary | ICD-10-CM

## 2022-05-03 MED ORDER — DOXYCYCLINE HYCLATE 100 MG PO TABS: 100.0000 mg | ORAL_TABLET | Freq: Two times a day (BID) | ORAL | 11 refills | Status: DC

## 2022-05-05 ENCOUNTER — Ambulatory Visit: Payer: Managed Care, Other (non HMO)

## 2022-05-06 ENCOUNTER — Other Ambulatory Visit: Payer: Self-pay | Admitting: Primary Care

## 2022-05-06 DIAGNOSIS — M1A09X0 Idiopathic chronic gout, multiple sites, without tophus (tophi): Secondary | ICD-10-CM

## 2022-05-11 ENCOUNTER — Encounter: Payer: Self-pay | Admitting: Oncology

## 2022-05-18 ENCOUNTER — Encounter: Payer: Self-pay | Admitting: Primary Care

## 2022-05-18 ENCOUNTER — Ambulatory Visit (INDEPENDENT_AMBULATORY_CARE_PROVIDER_SITE_OTHER): Payer: Commercial Managed Care - HMO | Admitting: Primary Care

## 2022-05-18 VITALS — BP 126/78 | HR 82 | Temp 98.6°F | Ht 65.0 in | Wt 221.0 lb

## 2022-05-18 DIAGNOSIS — E119 Type 2 diabetes mellitus without complications: Secondary | ICD-10-CM

## 2022-05-18 DIAGNOSIS — L732 Hidradenitis suppurativa: Secondary | ICD-10-CM

## 2022-05-18 DIAGNOSIS — K824 Cholesterolosis of gallbladder: Secondary | ICD-10-CM

## 2022-05-18 DIAGNOSIS — N200 Calculus of kidney: Secondary | ICD-10-CM

## 2022-05-18 LAB — GLUCOSE, POCT (MANUAL RESULT ENTRY): POC Glucose: 115 mg/dl — AB (ref 70–99)

## 2022-05-18 NOTE — Assessment & Plan Note (Signed)
Reviewed surgery notes and admission notes from hospitalization at [facility] through care everywhere. Also reviewed CT renal stone protocol at [facility] through care everywhere.  She appears to be recovering well overall. We will order right upper quadrant abdominal ultrasound for incidental finding of gallbladder nodule.

## 2022-05-18 NOTE — Patient Instructions (Signed)
You will be contacted regarding your gall bladder ultrasound.  Please let us know if you have not been contacted within two weeks.   I'll be in touch again soon regarding options for hidradenitis suppurativa.   It was a pleasure to see you today!

## 2022-05-18 NOTE — Progress Notes (Signed)
Subjective:    Patient ID: [name], female    DOB: [birth year], 56 y.o.   MRN: [id number]  HPI  [name] is a very pleasant 56 y.o. female with a significant medical history including type 2 diabetes, HIV, thyroid cancer, breast cancer, hidradenitis suppurativa, hypertension, renal stones, GAD, sarcoidosis, CKD to who presents today for follow-up post renal procedure and to discuss options for treatment of hidradenitis suppurativa.    1) Renal Calculi: Following with urology. She underwent right nephrostolithotomy/pyelostolithotomy, right uterogram retrograde, right cystourethroscopy with ureteral catheterization, right cystourethroscopy with indwelling ureteral stent for right renal calculi on 05/04/2022.  Admitted 05/04/2022 and discharged home on 05/06/2022.  During her admission she underwent CT renal stone protocol on 05/05/2022 which revealed expected posttreatment changes of the right kidney with scattered nonobstructing renal stone fragments; and nodule along the gallbladder wall.  Recommendations for right upper quadrant abdominal ultrasound were provided.  Today she is improving since her procedure and Covid-19 illness. She was told to see her PCP for further evaluation of her gall bladder nodule. No ultrasound was performed during her hospital stay. She has an appointment her surgeon scheduled for next week for stent removal. She denies complications during or since her procedure. She does have a decreased appetite and general fatigue. No fevers.   2) Hidradenitis Suppurativa: Chronic to bilateral axilla and chest wall. Currently managed on doxycycline 100 mg BID per infectious disease for which she's been taking for years. She's actually been taking Doxycycline once daily.  One of her family members committed suicide last year and it was suspected that Doxycycline caused symptoms of depression which led to his actions of self harm. She has a long history of feeling  down/depression, little interest in doing things.   A few weeks ago she decided to stop her Doxycycline to see if this would help with those feelings and she noticed an improvement. Unfortunately, her boils returned so she had to resume Doxycycline. Since resuming Doxycycline her symptoms of depression have returned. When she contacted her infectious disease doctor for other treatment options, she was told to contact her PCP.   She is requesting another option for controlling her hidradenitis suppurativa aside from Doxycycline.   Review of Systems  Constitutional:  Positive for appetite change and fatigue. Negative for fever.  Respiratory:  Negative for shortness of breath.   Skin:        Chronic and recurrent boils  Psychiatric/Behavioral:         See HPI         Past Medical History:  Diagnosis Date   Allergy    Anemia    Normocytic   Antibiotic-induced yeast infection 09/28/2020   Anxiety    Asthma    Blood dyscrasia    Bronchitis 2005   Bursitis of left shoulder 09/06/2019   CKD (chronic kidney disease)    CLASS 1-EXOPHTHALMOS-THYROTOXIC 02/08/2007   COVID-19 virus infection 04/28/2022   Diabetes mellitus without complication ([place])    Family history of breast cancer    Family history of lung cancer    Family history of prostate cancer    Gastroenteritis 07/10/07   Genetic testing 07/25/2018   [employer] + RNA [employer]ight was ordered.  Genes Analyzed (43 total): APC*, ATM*, AXIN2, BARD1, BMPR1A, BRCA1*, BRCA2*, BRIP1*, CDH1*, CDK4, CDKN2A, CHEK2*, DICER1, GALNT12, HOXB13, MEN1, MLH1*, MRE11A, MSH2*, MSH3, MSH6*, MUTYH*, NBN, NF1*, NTHL1, PALB2*, PMS2*, POLD1, POLE, PTEN*, RAD50, RAD51C*, RAD51D*, RET, SDHB, SDHD, SMAD4, SMARCA4, STK11 and TP53* (  sequencing and deletion/duplication); EGFR (s   GERD 07/24/2006   GRAVE'S DISEASE 01/01/2008   History of hidradenitis suppurativa    History of kidney stones    History of thrush    HIV DISEASE 07/24/2006   dx March 05   Hyperlipidemia     HYPERTENSION 07/24/2006   Hyperthyroidism 08/2006   Grave's Disease -diffuse radiotracer uptake 08/25/06 Thyroid scan-Cold nodule to R lower lobe of thyrorid   Menometrorrhagia    hx of   Nephrolithiasis    Panniculitis 05/12/2020   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 Completed 2 yrs Prednisone after lung bx confirmation. No symptoms since then.   Suppurative hidradenitis    Thyroid cancer ([place])    THYROID NODULE, RIGHT 02/08/2007    Social History   Socioeconomic History   Marital status: Single    Spouse name: Not on file   Number of children: 1   Years of education: Not on file   Highest education level: Not on file  Occupational History   Occupation: Secondary school teacher  Tobacco Use   Smoking status: Former    Packs/day: 0.50    Years: 15.00    Total pack years: 7.50    Types: Cigarettes    Start date: 04/12/2017    Quit date: 2019    Years since quitting: 4.5   Smokeless tobacco: Never  Vaping Use   Vaping Use: Never used  Substance and Sexual Activity   Alcohol use: Yes    Alcohol/week: 0.0 standard drinks of alcohol    Comment: social   Drug use: No   Sexual activity: Not Currently    Birth control/protection: Post-menopausal    Comment: declined condoms  Other Topics Concern   Not on file  Social History Narrative   Right Handed    Lives in a two story home   Social Determinants of Health   Financial Resource Strain: Not on file  Food Insecurity: Not on file  Transportation Needs: No Transportation Needs (10/25/2018)    PRAPARE - Hydrologist (Medical): No    Lack of Transportation (Non-Medical): No  Physical Activity: Not on file  Stress: Not on file  Social Connections: Not on file  Intimate Partner Violence: Not At Risk (10/25/2018)   Humiliation, Afraid, Rape, and Kick questionnaire    Fear of Current or Ex-Partner: No    Emotionally Abused: No    Physically Abused: No    Sexually Abused: No    Past Surgical History:  Procedure Laterality Date   APPLICATION OF WOUND VAC N/A 01/20/2021   Procedure: APPLICATION OF WOUND VAC;  Surgeon: Wallace Going, DO;  Location: [facility];  Service: Plastics;  Laterality: N/A;   BREAST EXCISIONAL BIOPSY Right 04/26/2018   right axilla negative   BREAST EXCISIONAL BIOPSY Left 04/26/2018   left axilla negative   BREAST LUMPECTOMY Right 10/03/2018   malignant   BREAST LUMPECTOMY WITH RADIOACTIVE SEED AND SENTINEL LYMPH NODE BIOPSY Right 10/03/2018   Procedure: RIGHT BREAST LUMPECTOMY WITH RADIOACTIVE SEED AND  SENTINEL LYMPH NODE MAPPING;  Surgeon: Erroll Luna, MD;  Location: [facility];  Service: General;  Laterality: Right;   BREAST SURGERY  1997   Breast Reduction    CYSTOSCOPY W/ URETERAL STENT REMOVAL  11/09/2012   Procedure: CYSTOSCOPY WITH STENT REMOVAL;  Surgeon: Alexis Frock, MD;  Location: [facility];  Service: Urology;  Laterality: Right;   CYSTOSCOPY WITH RETROGRADE PYELOGRAM, URETEROSCOPY AND STENT PLACEMENT  11/09/2012   Procedure: CYSTOSCOPY WITH RETROGRADE PYELOGRAM, URETEROSCOPY AND STENT PLACEMENT;  Surgeon: Alexis Frock, MD;  Location: [facility];  Service: Urology;  Laterality: Left;  LEFT URETEROSCOPY, STONE MANIPULATION, left STENT exchange    CYSTOSCOPY WITH STENT PLACEMENT  10/02/2012   Procedure: CYSTOSCOPY WITH STENT PLACEMENT;  Surgeon: Alexis Frock, MD;  Location: [facility];  Service: Urology;  Laterality: Left;   DEBRIDEMENT AND CLOSURE WOUND N/A 01/20/2021   Procedure: Excision of abdominal wound  with closure;  Surgeon: Wallace Going, DO;  Location: [facility];  Service: Plastics;  Laterality: N/A;   DILATION AND CURETTAGE OF UTERUS  Feb 2004   s/p for 1st trimester nonviable pregnancy   EYE SURGERY     sty under eyelid   INCISE AND DRAIN ABCESS  Nov 03   s/p I &D for righ inframmary fold hidradenitis   INCISION AND DRAINAGE PERITONSILLAR ABSCESS  Mar 03   IR CV LINE INJECTION  06/07/2018   IR IMAGING GUIDED PORT INSERTION  06/20/2018   IR REMOVAL TUN ACCESS W/ PORT W/O FL MOD SED  06/20/2018   IRRIGATION AND DEBRIDEMENT ABSCESS  01/31/2012   Procedure: IRRIGATION AND DEBRIDEMENT ABSCESS;  Surgeon: Shann Medal, MD;  [facility];  Service: General;  Laterality: Right;  right breast and axilla    NEPHROLITHOTOMY  10/02/2012   Procedure: NEPHROLITHOTOMY PERCUTANEOUS;  Surgeon: Alexis Frock, MD;  [facility];  Service: Urology;  Laterality: Right;  First Stage Percutaneous Nephrolithotomy with Surgeon Access, Left Ureteral Stent     NEPHROLITHOTOMY  10/04/2012   Procedure: NEPHROLITHOTOMY PERCUTANEOUS SECOND LOOK;  Surgeon: Alexis Frock, MD;  [facility];  Service: Urology;  Laterality: Right;      NEPHROLITHOTOMY  10/08/2012   Procedure: NEPHROLITHOTOMY PERCUTANEOUS;  Surgeon: Alexis Frock, MD;  [facility];  Service: Urology;  Laterality: Right;  THIRD STAGE, nephrostomy tube exchange x 2   NEPHROLITHOTOMY  10/11/2012   Procedure: NEPHROLITHOTOMY PERCUTANEOUS SECOND LOOK;  Surgeon: Alexis Frock, MD;  [facility];  Service: Urology;  Laterality: Right;  RIGHT 4 STAGE PERCUTANOUS NEPHROLITHOTOMY, right URETEROSCOPY WITH HOLMIUM LASER    PANNICULECTOMY N/A 12/21/2020   Procedure: PANNICULECTOMY;  Surgeon: Wallace Going, DO;  Location: [facility];  Service: Plastics;  Laterality: N/A;   PORT-A-CATH REMOVAL N/A 07/16/2020   Procedure: REMOVAL PORT-A-CATH;  Surgeon: Erroll Luna, MD;  Location: [facility];  Service:  General;  Laterality: N/A;   PORTACATH PLACEMENT Left 05/17/2018   Procedure: INSERTION PORT-A-CATH;  Surgeon: Coralie Keens, MD;  Location: [facility];  Service: General;  Laterality: Left;   RADICAL NECK DISSECTION  04/12/2017   limited/notes 04/12/2017   RADICAL NECK DISSECTION N/A 04/12/2017   Procedure: RADICAL NECK DISSECTION;  Surgeon: Melida Quitter, MD;  Location: [facility];  Service: ENT;  Laterality: N/A;  limited neck dissection 2 hours total   REDUCTION MAMMAPLASTY Bilateral 1998   RIGHT/LEFT HEART CATH AND CORONARY ANGIOGRAPHY N/A 03/12/2020   Procedure: RIGHT/LEFT HEART CATH AND CORONARY ANGIOGRAPHY;  Surgeon: Jolaine Artist, MD;  Location: [facility];  Service: Cardiovascular;  Laterality: N/A;   [place]  1994   THYROIDECTOMY  04/12/2017   completion/notes 04/12/2017   THYROIDECTOMY N/A 04/12/2017   Procedure: THYROIDECTOMY;  Surgeon: Melida Quitter, MD;  Location: [facility];  Service: ENT;  Laterality: N/A;  Completion Thyroidectomy   TOTAL THYROIDECTOMY  2010    Family History  Problem Relation Age of Onset   Hypertension Mother    Cancer Mother        laryngeal   Heart disease Mother        stent   Hypertension Father    Lung cancer Father 67       hx smoking   Heart disease Other    Hypertension Other    Stroke Other        Grandparent   Kidney disease Other        Grandparent   Diabetes Other        FH of Diabetes   Hypertension Sister    Cancer Maternal Uncle        Lung CA   Hypertension Brother    Hypertension Sister    Breast cancer Maternal Aunt 65   Breast cancer Paternal Aunt 36   Prostate cancer Paternal Uncle    Breast cancer Maternal Aunt        dx 60+   Breast cancer Paternal Aunt        dx 58's   Breast cancer Paternal Aunt        dx 50's   Prostate cancer Paternal Uncle    Lung cancer Paternal Uncle    Breast cancer Cousin 69   Breast cancer Cousin        dx <50   Breast cancer Cousin        dx <50   Breast  cancer Cousin        dx <50   Colon cancer Neg Hx    Esophageal cancer Neg Hx    Rectal cancer Neg Hx    Stomach cancer Neg Hx     Allergies  Allergen Reactions   Genvoya [Elviteg-Cobic-Emtricit-Tenofaf] Hives   Lisinopril Cough   Tape Rash    Rash  Okay with tegaderm and paper tape   Tegaderm Ag Mesh [Silver] Itching    Current Outpatient Medications on File Prior to Visit  Medication Sig Dispense Refill   acetaminophen (TYLENOL) 500 MG tablet Take 1,000 mg by mouth every 6 (six) hours as needed for mild pain.     albuterol (VENTOLIN HFA) 108 (90 Base) MCG/ACT inhaler Inhale 2 puffs into the lungs every 6 (six) hours as needed for wheezing or shortness of breath. 8 g 2   allopurinol (ZYLOPRIM) 100 MG tablet TAKE 1 TABLET BY MOUTH EVERY DAY FOR GOUT PREVENTION 90 tablet 1   Blood Glucose Monitoring Suppl (FREESTYLE LITE) w/Device KIT Inject 1 each into the skin daily. 1 kit 0   candesartan (ATACAND) 32 MG tablet Take 1 tablet (32 mg total) by mouth daily. 90 tablet 1   carvedilol (COREG) 25 MG tablet Take 1 tablet (25 mg total) by mouth 2 (two) times daily. 180 tablet 3   cetirizine (ZYRTEC) 10 MG tablet Take 1 tablet by mouth at bedtime for allergies. 90 tablet 3   dolutegravir ([name]) 50 MG tablet Take 1 tablet (50 mg total) by mouth daily. 30 tablet 11   doxycycline (VIBRA-TABS) 100 MG tablet Take 1 tablet (100 mg total) by mouth 2 (two) times daily.  60 tablet 11   emtricitabine-tenofovir AF (DESCOVY) 200-25 MG tablet Take 1 tablet by mouth daily. 30 tablet 11   FLOVENT HFA 110 MCG/ACT inhaler TAKE 1 PUFF BY MOUTH TWICE A DAY (Patient taking differently: Inhale 1 puff into the lungs 2 (two) times daily as needed (wheezing/shortness of breath).) 36 Inhaler 1   fluticasone (FLONASE) 50 MCG/ACT nasal spray Place 2 sprays into both nostrils daily. 16 g 6   gabapentin (NEURONTIN) 300 MG capsule Take 300 mg by mouth daily.     glipiZIDE (GLUCOTROL) 10 MG tablet Take 1 tablet (10 mg  total) by mouth 2 (two) times daily. For diabetes. 180 tablet 1   glucose blood (ONETOUCH VERIO) test strip USE AS INSTRUCTED TO TEST BLOOD SUGAR up to 3 times daily. 300 each 3   guaiFENesin-codeine (ROBITUSSIN AC) 100-10 MG/5ML syrup Take 5 mLs by mouth 3 (three) times daily as needed for cough. 75 mL 0   hydrALAZINE (APRESOLINE) 50 MG tablet Take 1 tablet (50 mg total) by mouth in the morning and at bedtime. 60 tablet 6   hydrOXYzine (ATARAX) 10 MG tablet Take 1 tablet (10 mg total) by mouth 2 (two) times daily as needed for itching. 30 tablet 0   Insulin Glargine (BASAGLAR KWIKPEN) 100 UNIT/ML Inject 10 Units into the skin at bedtime. For diabetes. 15 mL 0   Insulin Pen Needle (PEN NEEDLES) 31G X 6 MM MISC Use nightly with insulin. 100 each 2   Lancets (ONETOUCH ULTRASOFT) lancets Use as instructed to test blood sugar daily 100 each 5   levothyroxine (SYNTHROID) 175 MCG tablet Take 1 tablet (175 mcg total) by mouth daily. 60 tablet 3   omeprazole (PRILOSEC) 40 MG capsule TAKE 1 CAPSULE BY MOUTH DAILY FOR REFLUX 90 capsule 3   predniSONE (DELTASONE) 20 MG tablet Take 2 tablets by mouth once daily for 5 days. 10 tablet 0   rosuvastatin (CRESTOR) 10 MG tablet TAKE 1 TABLET BY MOUTH EVERY DAY FOR CHOLESTEROL 90 tablet 0   sertraline (ZOLOFT) 50 MG tablet TAKE 1 TABLET (50 MG TOTAL) BY MOUTH DAILY. FOR ANXIETY AND DEPRESSION. (Patient not taking: Reported on 04/28/2022) 90 tablet 3   spironolactone (ALDACTONE) 25 MG tablet Take 1 tablet (25 mg total) by mouth daily. 90 tablet 3   tamoxifen (NOLVADEX) 20 MG tablet Take 1 tablet (20 mg total) by mouth daily. 90 tablet 4   [DISCONTINUED] prochlorperazine (COMPAZINE) 10 MG tablet TAKE 1 TABLET BY MOUTH EVERY 6 HOURS AS NEEDED FOR NAUSEA OR VOMITING 30 tablet 2   Current Facility-Administered Medications on File Prior to Visit  Medication Dose Route Frequency Provider Last Rate Last Admin   sodium chloride flush (NS) 0.9 % injection 10 mL  10 mL  Intracatheter PRN Magrinat, Virgie Dad, MD   10 mL at 09/19/18 1551    BP 126/78   Pulse 82   Temp 98.6 F (37 C) (Oral)   Ht _0  (1.651 m)   Wt 221 lb (100.2 kg)   LMP 03/31/2014 (LMP Unknown)   SpO2 99%   BMI 36.78 kg/m  Objective:   Physical Exam Cardiovascular:     Rate and Rhythm: Normal rate and regular rhythm.  Pulmonary:     Effort: Pulmonary effort is normal.     Breath sounds: Normal breath sounds.  Musculoskeletal:     Cervical back: Neck supple.  Skin:    General: Skin is warm and dry.  Psychiatric:        Mood  and Affect: Mood normal.           Assessment & Plan:   Problem List Items Addressed This Visit       Endocrine   Non-insulin dependent type 2 diabetes mellitus ([place]) - Primary   Relevant Orders   POCT Glucose (CBG) (Completed)     Musculoskeletal and Integument   Hidradenitis suppurativa    Will contact infectious disease to determine other options for treatment. Cephalosporins could work, but no detail found regarding which cephalosporin.  Await infectious disease response.        Genitourinary   Renal calculi    Reviewed surgery notes and admission notes from hospitalization at [facility] through care everywhere. Also reviewed CT renal stone protocol at [facility] through care everywhere.  She appears to be recovering well overall. We will order right upper quadrant abdominal ultrasound for incidental finding of gallbladder nodule.      Other Visit Diagnoses     Gall bladder polyp       Relevant Orders   US ABDOMEN LIMITED RUQ (LIVER/GB)          [name], NP

## 2022-05-18 NOTE — Assessment & Plan Note (Signed)
Will contact infectious disease to determine other options for treatment. Cephalosporins could work, but no detail found regarding which cephalosporin.  Await infectious disease response.

## 2022-05-19 ENCOUNTER — Telehealth: Payer: Self-pay

## 2022-05-19 ENCOUNTER — Encounter: Payer: Self-pay | Admitting: Internal Medicine

## 2022-05-19 NOTE — Telephone Encounter (Signed)
-----   Message from Michel Bickers, MD sent at 05/19/2022  9:40 AM EDT ----- Regarding: FW: Hidradenitis Suppurativa Please see if Lyna is available to come in and see me next Wednesday morning.  Thanks. ----- Message ----- From: [name], NP Sent: 05/18/2022   4:21 PM EDT To: Michel Bickers, MD Subject: Hidradenitis Suppurativa                       Dr. [name],  This mutual patient of ours visited me today regarding switching her doxycycline regimen to another antibiotic for chronic treatment of her hidradenitis suppurativa. Are you able to help her with another regimen? If not, then what else could you recommend? She does not wish to take Doxycycline any longer.  Thank you in advance for your time and consideration, Allie Bossier, NP-C [place]

## 2022-05-20 ENCOUNTER — Telehealth: Payer: Self-pay | Admitting: Primary Care

## 2022-05-20 NOTE — Telephone Encounter (Signed)
Called patient and advised Dr. Chilton [place] office will be calling her next week regarding a f/u appt to discuss medication options for her hidradenitis.

## 2022-05-20 NOTE — Telephone Encounter (Signed)
Please call patient and notify her that I spoke with Dr. [name] regarding the Doxycycline use for her Hidradenitis. He is going to reach out to her next week for an office visit to discuss other options.

## 2022-05-24 ENCOUNTER — Ambulatory Visit: Payer: Managed Care, Other (non HMO)

## 2022-05-24 NOTE — Progress Notes (Deleted)
05/24/2022 [name] 05-16-1966 [id number]   HPI:  [name] is a 56 y.o. female patient of Dr Oval Linsey, with a PMH below who presents today for advanced hypertension follow up.  She was referred to our clinic by Dr. Haroldine Laws, who sees her for cardio-oncology.  originally seen by Dr. Oval Linsey last fall, with a pressure of 162/102. At that visit she noted that home readings were mostly 570-177'L systolic, however the diastolic was 39-030'S.  She had also just started prednisone for a gout flare at that time.  At follow up visits she continued to have problems with diastolic elevations.  At her last visit with me in August, it was determined that she was taking carvedilol only once daily in the mornings with her other meds.   I saw her last  just after Thanksgiving.  At that time her pressure was still elevated at 166/110, and amlodipine 5 mg was added.  She was seen again in January, at which time she complained about taking too many medications.  She was not willing to increase BP medications at that time.  At her most recent visit amlodipine was discontinued due to itch/rash and she was given hydralazine 50 mg bid. It was also noted that she had not picked up the carvedilol in almost 8 months (90 day supply).    Today she returns for follow up.       Past Medical History: hyperlipidemia 6/22 - LDL 71 on rosuvastatin 10 mg daily  Breast cancer Now on tamoxifen  DM2 8/22 A1c 6.1 on Jardiance 10 mg  CKD3 6/22 SCr 2.74 CrCl with adjusted body weight - 28  hypothroidism 7/22 TSH 20.04 - on levothyroxine 112 mcg (needs f/u with Endo)     Blood Pressure Goal:  130/80  Current Medications: candesartan 32 mg qd, carvedilol 25 mg bid, spironolactone 25 mg qd, amlodipine 5 mg qd  Family Hx: mother with heart disease; both parents and multiple siblings with hypertension, breast cancer in 3 relatives, (both paternal and maternal)  Social Hx: no tobacco, only occasional  alcohol; no regular caffeine intake  Diet: usually eats 2 meals per day, has some intolerances to smells, so avoids restaurants, does not add salt to her foods. Oatmeal for breakfast and sometimes, adds banansa; salad and salmon; hasn't been snacking since hospitla, likes chips; prefers baked chips   Exercise: none  Home BP readings: no readings with her  Intolerances: lisinopril - cough; hctz -gout flares  Labs:   10/27/21:  Na 138, K 3.9, Glu 213, BUN 26, SCr 2.48, GFR 21.6     10/22:  Na 140, K 4.0, Glu 104, SCr 2.72, GFR < 20   Wt Readings from Last 3 Encounters:  05/18/22 221 lb (100.2 kg)  04/28/22 224 lb (101.6 kg)  03/01/22 238 lb 6.4 oz (108.1 kg)   BP Readings from Last 3 Encounters:  05/18/22 126/78  04/28/22 130/72  03/01/22 (!) 148/100   Pulse Readings from Last 3 Encounters:  05/18/22 82  04/28/22 76  03/01/22 62    Current Outpatient Medications  Medication Sig Dispense Refill   acetaminophen (TYLENOL) 500 MG tablet Take 1,000 mg by mouth every 6 (six) hours as needed for mild pain.     albuterol (VENTOLIN HFA) 108 (90 Base) MCG/ACT inhaler Inhale 2 puffs into the lungs every 6 (six) hours as needed for wheezing or shortness of breath. 8 g 2   allopurinol (ZYLOPRIM) 100 MG tablet TAKE 1 TABLET  BY MOUTH EVERY DAY FOR GOUT PREVENTION 90 tablet 1   Blood Glucose Monitoring Suppl (FREESTYLE LITE) w/Device KIT Inject 1 each into the skin daily. 1 kit 0   candesartan (ATACAND) 32 MG tablet Take 1 tablet (32 mg total) by mouth daily. 90 tablet 1   carvedilol (COREG) 25 MG tablet Take 1 tablet (25 mg total) by mouth 2 (two) times daily. 180 tablet 3   cetirizine (ZYRTEC) 10 MG tablet Take 1 tablet by mouth at bedtime for allergies. 90 tablet 3   dolutegravir ([name]) 50 MG tablet Take 1 tablet (50 mg total) by mouth daily. 30 tablet 11   doxycycline (VIBRA-TABS) 100 MG tablet Take 1 tablet (100 mg total) by mouth 2 (two) times daily. 60 tablet 11    emtricitabine-tenofovir AF (DESCOVY) 200-25 MG tablet Take 1 tablet by mouth daily. 30 tablet 11   FLOVENT HFA 110 MCG/ACT inhaler TAKE 1 PUFF BY MOUTH TWICE A DAY (Patient taking differently: Inhale 1 puff into the lungs 2 (two) times daily as needed (wheezing/shortness of breath).) 36 Inhaler 1   fluticasone (FLONASE) 50 MCG/ACT nasal spray Place 2 sprays into both nostrils daily. 16 g 6   gabapentin (NEURONTIN) 300 MG capsule Take 300 mg by mouth daily.     glipiZIDE (GLUCOTROL) 10 MG tablet Take 1 tablet (10 mg total) by mouth 2 (two) times daily. For diabetes. 180 tablet 1   glucose blood (ONETOUCH VERIO) test strip USE AS INSTRUCTED TO TEST BLOOD SUGAR up to 3 times daily. 300 each 3   guaiFENesin-codeine (ROBITUSSIN AC) 100-10 MG/5ML syrup Take 5 mLs by mouth 3 (three) times daily as needed for cough. 75 mL 0   hydrALAZINE (APRESOLINE) 50 MG tablet Take 1 tablet (50 mg total) by mouth in the morning and at bedtime. 60 tablet 6   hydrOXYzine (ATARAX) 10 MG tablet Take 1 tablet (10 mg total) by mouth 2 (two) times daily as needed for itching. 30 tablet 0   Insulin Glargine (BASAGLAR KWIKPEN) 100 UNIT/ML Inject 10 Units into the skin at bedtime. For diabetes. 15 mL 0   Insulin Pen Needle (PEN NEEDLES) 31G X 6 MM MISC Use nightly with insulin. 100 each 2   Lancets (ONETOUCH ULTRASOFT) lancets Use as instructed to test blood sugar daily 100 each 5   levothyroxine (SYNTHROID) 175 MCG tablet Take 1 tablet (175 mcg total) by mouth daily. 60 tablet 3   omeprazole (PRILOSEC) 40 MG capsule TAKE 1 CAPSULE BY MOUTH DAILY FOR REFLUX 90 capsule 3   predniSONE (DELTASONE) 20 MG tablet Take 2 tablets by mouth once daily for 5 days. 10 tablet 0   rosuvastatin (CRESTOR) 10 MG tablet TAKE 1 TABLET BY MOUTH EVERY DAY FOR CHOLESTEROL 90 tablet 0   sertraline (ZOLOFT) 50 MG tablet TAKE 1 TABLET (50 MG TOTAL) BY MOUTH DAILY. FOR ANXIETY AND DEPRESSION. (Patient not taking: Reported on 04/28/2022) 90 tablet 3    spironolactone (ALDACTONE) 25 MG tablet Take 1 tablet (25 mg total) by mouth daily. 90 tablet 3   tamoxifen (NOLVADEX) 20 MG tablet Take 1 tablet (20 mg total) by mouth daily. 90 tablet 4   No current facility-administered medications for this visit.   Facility-Administered Medications Ordered in Other Visits  Medication Dose Route Frequency Provider Last Rate Last Admin   sodium chloride flush (NS) 0.9 % injection 10 mL  10 mL Intracatheter PRN Magrinat, Virgie Dad, MD   10 mL at 09/19/18 1551    Allergies  Allergen  Reactions   Genvoya [Elviteg-Cobic-Emtricit-Tenofaf] Hives   Lisinopril Cough   Tape Rash    Rash  Okay with tegaderm and paper tape   Tegaderm Ag Mesh [Silver] Itching    Past Medical History:  Diagnosis Date   Allergy    Anemia    Normocytic   Antibiotic-induced yeast infection 09/28/2020   Anxiety    Asthma    Blood dyscrasia    Bronchitis 2005   Bursitis of left shoulder 09/06/2019   CKD (chronic kidney disease)    CLASS 1-EXOPHTHALMOS-THYROTOXIC 02/08/2007   COVID-19 virus infection 04/28/2022   Diabetes mellitus without complication ([place])    Family history of breast cancer    Family history of lung cancer    Family history of prostate cancer    Gastroenteritis 07/10/07   Genetic testing 07/25/2018   CustomNext + RNA Insight was ordered.  Genes Analyzed (43 total): APC*, ATM*, AXIN2, BARD1, BMPR1A, BRCA1*, BRCA2*, BRIP1*, CDH1*, CDK4, CDKN2A, CHEK2*, DICER1, GALNT12, HOXB13, MEN1, MLH1*, MRE11A, MSH2*, MSH3, MSH6*, MUTYH*, NBN, NF1*, NTHL1, PALB2*, PMS2*, POLD1, POLE, PTEN*, RAD50, RAD51C*, RAD51D*, RET, SDHB, SDHD, SMAD4, SMARCA4, STK11 and TP53* (sequencing and deletion/duplication); EGFR (s   GERD 07/24/2006   GRAVE'S DISEASE 01/01/2008   History of hidradenitis suppurativa    History of kidney stones    History of thrush    HIV DISEASE 07/24/2006   dx March 05   Hyperlipidemia    HYPERTENSION 07/24/2006   Hyperthyroidism 08/2006   Grave's Disease  -diffuse radiotracer uptake 08/25/06 Thyroid scan-Cold nodule to R lower lobe of thyrorid   Menometrorrhagia    hx of   Nephrolithiasis    Panniculitis 05/12/2020   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 Completed 2 yrs Prednisone after lung bx confirmation. No symptoms since then.   Suppurative hidradenitis    Thyroid cancer ([place])    THYROID NODULE, RIGHT 02/08/2007    Last menstrual period 03/31/2014.  No problem-specific Assessment & Plan notes found for this encounter.     Tommy Medal PharmD CPP [facility] [address] [place], [place] [address] [phone]

## 2022-05-26 ENCOUNTER — Telehealth: Payer: Self-pay

## 2022-05-26 ENCOUNTER — Ambulatory Visit
Admission: RE | Admit: 2022-05-26 | Discharge: 2022-05-26 | Disposition: A | Discharge status: Home / Self Care | Payer: Commercial Managed Care - HMO | Source: Ambulatory Visit | Attending: Primary Care | Admitting: Primary Care

## 2022-05-26 DIAGNOSIS — K824 Cholesterolosis of gallbladder: Secondary | ICD-10-CM

## 2022-05-26 NOTE — Telephone Encounter (Signed)
Reviewed results, will contact patient.

## 2022-05-26 NOTE — Telephone Encounter (Signed)
[facility] called and wanted to inform us that the results from ultrasound came back with findings and wanted to make [place] aware.

## 2022-06-07 ENCOUNTER — Other Ambulatory Visit: Payer: Self-pay | Admitting: Primary Care

## 2022-06-07 DIAGNOSIS — K828 Other specified diseases of gallbladder: Secondary | ICD-10-CM

## 2022-06-09 ENCOUNTER — Other Ambulatory Visit (HOSPITAL_COMMUNITY): Payer: Self-pay

## 2022-06-09 ENCOUNTER — Other Ambulatory Visit: Payer: Self-pay

## 2022-06-09 ENCOUNTER — Ambulatory Visit (INDEPENDENT_AMBULATORY_CARE_PROVIDER_SITE_OTHER): Payer: Commercial Managed Care - HMO | Admitting: Internal Medicine

## 2022-06-09 ENCOUNTER — Encounter: Payer: Self-pay | Admitting: Internal Medicine

## 2022-06-09 DIAGNOSIS — B20 Human immunodeficiency virus [HIV] disease: Secondary | ICD-10-CM

## 2022-06-09 DIAGNOSIS — F331 Major depressive disorder, recurrent, moderate: Secondary | ICD-10-CM

## 2022-06-09 DIAGNOSIS — L732 Hidradenitis suppurativa: Secondary | ICD-10-CM | POA: Diagnosis not present

## 2022-06-09 MED ORDER — MINOCYCLINE HCL 100 MG PO TABS: 100.0000 mg | ORAL_TABLET | Freq: Two times a day (BID) | ORAL | 11 refills | Status: DC

## 2022-06-09 NOTE — Assessment & Plan Note (Addendum)
I do not believe that doxycycline precipitated her brothers suicide several years ago.  However, she has become fixated on potential risk to continue doxycycline therapy.  She is willing to try minocycline.  I reminded her that antibiotics do not treat the underlying hidradenitis which simply can suppress the infectious complications.  Since her hidradenitis is really confined to 2 discrete areas she may be a candidate for surgical excision.  She will follow-up with her breast surgeon.

## 2022-06-09 NOTE — Progress Notes (Signed)
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    Priority: Medium    Cigarette smoker 02/08/2007    Priority: Medium    HIV disease ([place]) 07/24/2006    Priority: Medium    Depression 07/24/2006    Priority: Medium    Essential hypertension 07/24/2006    Priority: Medium    Asthma 07/24/2006    Priority: Medium    Pruritus 11/26/2021   Chronic illness 10/27/2021   Acute renal failure superimposed on stage 4 chronic kidney disease ([place]) 10/24/2021   Acute pancreatitis 10/23/2021   Lymphadenopathy 07/01/2021   Paresthesias 06/30/2021   Chronic pain of right hand 06/30/2021   Chronic midline low back pain with right-sided sciatica 06/30/2021   Voice hoarseness 12/15/2020   Recurrent falls 11/05/2020   Impingement syndrome of left shoulder region 05/27/2020   Inclusion cyst 05/01/2020   Sebaceous cyst 04/21/2020   Postoperative breast asymmetry 02/28/2020   Cognitive dysfunction 02/21/2020   Chronic gout 01/27/2020   Hematuria 12/10/2019   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   Pain in joint of left shoulder 09/05/2019   Dysphagia 07/23/2019   CKD stage 3 due to type 2 diabetes mellitus ([place]) 07/12/2019   Normocytic anemia 07/12/2019   Preventative health care 09/10/2018   Port-A-Cath in place 07/12/2018   Family history of breast cancer    Family history of prostate cancer    Family history of lung cancer    Thyroid cancer ([place])    Hyperlipidemia 04/26/2017   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  Previous Medications   ACETAMINOPHEN (TYLENOL) 500 MG TABLET    Take 1,000 mg by mouth every 6 (six) hours as needed for mild pain.   ALBUTEROL (VENTOLIN HFA) 108 (90 BASE) MCG/ACT INHALER    Inhale 2 puffs into the lungs every 6 (six) hours as needed for wheezing or shortness of breath.   ALLOPURINOL (ZYLOPRIM) 100 MG TABLET    TAKE 1 TABLET BY MOUTH EVERY DAY FOR GOUT PREVENTION   BLOOD GLUCOSE MONITORING SUPPL (FREESTYLE LITE) W/DEVICE KIT    Inject 1 each into the skin daily.   CANDESARTAN (ATACAND) 32 MG TABLET    Take 1 tablet (32 mg total) by mouth daily.   CARVEDILOL (COREG) 25 MG TABLET    Take 1 tablet (25 mg total) by mouth 2 (two) times daily.   CETIRIZINE (ZYRTEC) 10 MG TABLET    Take 1 tablet by mouth at bedtime for allergies.   DOLUTEGRAVIR ([name]) 50 MG TABLET    Take 1 tablet (50  mg total) by mouth daily.   EMTRICITABINE-TENOFOVIR AF (DESCOVY) 200-25 MG TABLET    Take 1 tablet by mouth daily.   FLOVENT HFA 110 MCG/ACT INHALER    TAKE 1 PUFF BY MOUTH TWICE A DAY   FLUTICASONE (FLONASE) 50 MCG/ACT NASAL SPRAY    Place 2 sprays into both nostrils daily.   GABAPENTIN (NEURONTIN) 300 MG CAPSULE    Take 300 mg by mouth daily.   GLIPIZIDE (GLUCOTROL) 10 MG TABLET    Take 1 tablet (10 mg total) by mouth 2 (two) times daily. For diabetes.   GLUCOSE BLOOD (ONETOUCH VERIO) TEST STRIP    USE AS INSTRUCTED TO TEST BLOOD SUGAR up to 3 times daily.   GUAIFENESIN-CODEINE (ROBITUSSIN AC) 100-10 MG/5ML SYRUP    Take 5 mLs by mouth 3  (three) times daily as needed for cough.   HYDRALAZINE (APRESOLINE) 50 MG TABLET    Take 1 tablet (50 mg total) by mouth in the morning and at bedtime.   HYDROXYZINE (ATARAX) 10 MG TABLET    Take 1 tablet (10 mg total) by mouth 2 (two) times daily as needed for itching.   INSULIN GLARGINE (BASAGLAR KWIKPEN) 100 UNIT/ML    Inject 10 Units into the skin at bedtime. For diabetes.   INSULIN PEN NEEDLE (PEN NEEDLES) 31G X 6 MM MISC    Use nightly with insulin.   LANCETS (ONETOUCH ULTRASOFT) LANCETS    Use as instructed to test blood sugar daily   LEVOTHYROXINE (SYNTHROID) 175 MCG TABLET    Take 1 tablet (175 mcg total) by mouth daily.   OMEPRAZOLE (PRILOSEC) 40 MG CAPSULE    TAKE 1 CAPSULE BY MOUTH DAILY FOR REFLUX   PREDNISONE (DELTASONE) 20 MG TABLET    Take 2 tablets by mouth once daily for 5 days.   ROSUVASTATIN (CRESTOR) 10 MG TABLET    TAKE 1 TABLET BY MOUTH EVERY DAY FOR CHOLESTEROL   SERTRALINE (ZOLOFT) 50 MG TABLET    TAKE 1 TABLET (50 MG TOTAL) BY MOUTH DAILY. FOR ANXIETY AND DEPRESSION.   SPIRONOLACTONE (ALDACTONE) 25 MG TABLET    Take 1 tablet (25 mg total) by mouth daily.   TAMOXIFEN (NOLVADEX) 20 MG TABLET    Take 1 tablet (20 mg total) by mouth daily.  Modified Medications   No medications on file  Discontinued Medications   DOXYCYCLINE (VIBRA-TABS) 100 MG TABLET    Take 1 tablet (100 mg total) by mouth 2 (two) times daily.    Subjective: [name] is seen on a work in basis.  She has been on chronic suppressive doxycycline for hidradenitis primarily causing recurrent boils and abscesses under her right breast and right axilla.  She has never had any attempt at definitive surgery or anti-inflammatory therapy.  One of her brother committed suicide several years ago and she recalls that the corner told her family that he thought that doxycycline treatment that her brother was taking may have precipitated the suicide.  She says that she has done some research and found a case report  indicating doxycycline was implicated in suicide.  She has chronic depression and is found that she spends a great deal of time thinking about the risk of being on doxycycline herself.  She has had no problems taking or tolerating her Descovy or [name].  However when [facility] [name] but did not refill her Descovy.  She would like to see if mail-order is an option  for her.  Review of Systems: Review of Systems  Constitutional:  Negative for fever and weight loss.  Psychiatric/Behavioral:  Positive for depression. Negative for suicidal ideas.     Past Medical History:  Diagnosis Date   Allergy    Anemia    Normocytic   Antibiotic-induced yeast infection 09/28/2020   Anxiety    Asthma    Blood dyscrasia    Bronchitis 2005   Bursitis of left shoulder 09/06/2019   CKD (chronic kidney disease)    CLASS 1-EXOPHTHALMOS-THYROTOXIC 02/08/2007   COVID-19 virus infection 04/28/2022   Diabetes mellitus without complication ([place])    Family history of breast cancer    Family history of lung cancer    Family history of prostate cancer    Gastroenteritis 07/10/07   Genetic testing 07/25/2018   CustomNext + RNA Insight was ordered.  Genes Analyzed (43 total): APC*, ATM*, AXIN2, BARD1, BMPR1A, BRCA1*, BRCA2*, BRIP1*, CDH1*, CDK4, CDKN2A, CHEK2*, DICER1, GALNT12, HOXB13, MEN1, MLH1*, MRE11A, MSH2*, MSH3, MSH6*, MUTYH*, NBN, NF1*, NTHL1, PALB2*, PMS2*, POLD1, POLE, PTEN*, RAD50, RAD51C*, RAD51D*, RET, SDHB, SDHD, SMAD4, SMARCA4, STK11 and TP53* (sequencing and deletion/duplication); EGFR (s   GERD 07/24/2006   GRAVE'S DISEASE 01/01/2008   History of hidradenitis suppurativa    History of kidney stones    History of thrush    HIV DISEASE 07/24/2006   dx March 05   Hyperlipidemia    HYPERTENSION 07/24/2006   Hyperthyroidism 08/2006   Grave's Disease -diffuse radiotracer uptake 08/25/06 Thyroid scan-Cold nodule to R lower lobe of thyrorid    Menometrorrhagia    hx of   Nephrolithiasis    Panniculitis 05/12/2020   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 Completed 2 yrs Prednisone after lung bx confirmation. No symptoms since then.   Suppurative hidradenitis    Thyroid cancer ([place])    THYROID NODULE, RIGHT 02/08/2007    Social History   Tobacco Use   Smoking status: Former    Packs/day: 0.50    Years: 15.00    Total pack years: 7.50    Types: Cigarettes    Start date: 04/12/2017    Quit date: 2019    Years since quitting: 4.6   Smokeless tobacco: Never  Vaping Use   Vaping Use: Never used  Substance Use Topics   Alcohol use: Yes    Alcohol/week: 0.0 standard drinks of alcohol    Comment: social   Drug use: No    Family History  Problem Relation Age of Onset   Hypertension Mother    Cancer Mother        laryngeal   Heart disease Mother        stent   Hypertension Father    Lung cancer Father 33       hx smoking   Heart disease Other    Hypertension Other    Stroke Other        Grandparent   Kidney disease Other        Grandparent   Diabetes Other        FH  of Diabetes   Hypertension Sister    Cancer Maternal Uncle        Lung CA   Hypertension Brother    Hypertension Sister    Breast cancer Maternal Aunt 65   Breast cancer Paternal Aunt 48   Prostate cancer Paternal Uncle    Breast cancer Maternal Aunt        dx 60+   Breast cancer Paternal Aunt        dx 43's   Breast cancer Paternal Aunt        dx 95's   Prostate cancer Paternal Uncle    Lung cancer  Paternal Uncle    Breast cancer Cousin 36   Breast cancer Cousin        dx <50   Breast cancer Cousin        dx <50   Breast cancer Cousin        dx <50   Colon cancer Neg Hx    Esophageal cancer Neg Hx    Rectal cancer Neg Hx    Stomach cancer Neg Hx     Allergies  Allergen Reactions   Genvoya [Elviteg-Cobic-Emtricit-Tenofaf] Hives   Lisinopril Cough   Tape Rash    Rash  Okay with tegaderm and paper tape   Tegaderm Ag Mesh [Silver] Itching    Objective: Vitals:   06/09/22 0936  BP: (!) 148/92  Pulse: 77  Resp: 16  SpO2: 98%  Weight: 217 lb (98.4 kg)  Height: $Remove'5\' 5"'[id number]$  (1.651 m)   Body mass index is 36.11 kg/m.  Physical Exam Constitutional:      Comments: Her spirits are good although she readily talks about her struggles with depression.  Cardiovascular:     Rate and Rhythm: Normal rate and regular rhythm.     Heart sounds: No murmur heard.    Comments: She has scars in her right axilla and under her right breast.  There are no active boils at this time. Pulmonary:     Effort: Pulmonary effort is normal.     Breath sounds: Normal breath sounds.  Psychiatric:        Mood and Affect: Mood normal.     Lab Results    Problem List Items Addressed This Visit       Medium    HIV disease ([place])    Her infection has been under excellent, long-term control.  She will continue Descovy and [name] and follow-up at her previously scheduled visit.  [facility].  If she continues to have trouble we may be able to arrange mail order from [facility].      Depression    She does not want to see our behavioral health counselor here.  She tells me that her PCP recommended antidepressant therapy recently but she declined.  I encouraged her to reconsider treatment.      Hidradenitis suppurativa    I do not believe that doxycycline precipitated her brothers suicide several years ago.  However, she has become fixated on  potential risk to continue doxycycline therapy.  She is willing to try minocycline.  I reminded her that antibiotics do not treat the underlying hidradenitis which simply can suppress the infectious complications.  Since her hidradenitis is really confined to 2 discrete areas she may be a candidate for surgical excision.  She will follow-up with her breast surgeon.      Relevant Medications   minocycline (DYNACIN) 100 MG  tablet     Michel Bickers, MD [facility] [phone] pager   [phone] cell 06/09/2022, 10:09 AM

## 2022-06-09 NOTE — Assessment & Plan Note (Signed)
She does not want to see our behavioral health counselor here.  She tells me that her PCP recommended antidepressant therapy recently but she declined.  I encouraged her to reconsider treatment.

## 2022-06-09 NOTE — Assessment & Plan Note (Signed)
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  She will continue Descovy and [name] and follow-up at her previously scheduled visit.  [facility].  If she continues to have trouble we may be able to arrange mail order from [place].

## 2022-06-09 NOTE — Progress Notes (Signed)
Spoke with Vanissa today about issues with [facility] filling only [name] and not Descovy. Upon further discussion, she states that it has only been recently and that the prescriptions are not in sync anymore. She stated she would like [facility] and try to work through it. Otherwise, she is happy to switch to mail order through [facility] in [place]. Gave her my card and said she could call if she wants to switch. She is approved through [facility] so has to fill [facility] or [facility].  Alfonse Spruce, PharmD, CPP, [place] Clinical Pharmacist Practitioner Infectious [facility] for Infectious Disease

## 2022-06-14 ENCOUNTER — Other Ambulatory Visit: Payer: Self-pay | Admitting: Primary Care

## 2022-06-14 DIAGNOSIS — E119 Type 2 diabetes mellitus without complications: Secondary | ICD-10-CM

## 2022-06-21 ENCOUNTER — Other Ambulatory Visit: Payer: Self-pay | Admitting: Primary Care

## 2022-06-21 DIAGNOSIS — E785 Hyperlipidemia, unspecified: Secondary | ICD-10-CM

## 2022-06-23 ENCOUNTER — Telehealth: Payer: Self-pay | Admitting: Primary Care

## 2022-06-23 NOTE — Telephone Encounter (Signed)
Pt called in requesting a call back wants to know what steps she need to take to get her Dog to be an emotional support Dog . Please Advise # [phone]

## 2022-06-24 NOTE — Telephone Encounter (Signed)
Called patient to let know that [name] is out of the office until 9/18 and will be reviewing messages that week. Once we get answer back from [place] we will reach out. If she has not received response from [place] on next steps by the following week she will let us know.

## 2022-06-25 ENCOUNTER — Ambulatory Visit
Admission: RE | Admit: 2022-06-25 | Discharge: 2022-06-25 | Disposition: A | Discharge status: Home / Self Care | Payer: Commercial Managed Care - HMO | Source: Ambulatory Visit | Attending: Primary Care | Admitting: Primary Care

## 2022-06-25 DIAGNOSIS — K828 Other specified diseases of gallbladder: Secondary | ICD-10-CM

## 2022-06-25 MED ORDER — GADOBUTROL 1 MMOL/ML IV SOLN
10.0000 mL | Freq: Once | INTRAVENOUS | Status: AC | PRN
  Administered 2022-06-25: 10 mL via INTRAVENOUS

## 2022-06-25 NOTE — Telephone Encounter (Signed)
I'm not quite sure of the process, she may have to do some research online. I'm happy to provide her with a letter stating that I agree that she should be allotted a support animal. Let me know what she finds out.

## 2022-06-27 ENCOUNTER — Encounter: Payer: Self-pay | Admitting: Internal Medicine

## 2022-06-27 ENCOUNTER — Ambulatory Visit (INDEPENDENT_AMBULATORY_CARE_PROVIDER_SITE_OTHER): Payer: Commercial Managed Care - HMO | Admitting: Internal Medicine

## 2022-06-27 ENCOUNTER — Other Ambulatory Visit: Payer: Self-pay | Admitting: Cardiovascular Disease

## 2022-06-27 VITALS — BP 126/74 | HR 85 | Ht 65.0 in | Wt 221.0 lb

## 2022-06-27 DIAGNOSIS — E890 Postprocedural hypothyroidism: Secondary | ICD-10-CM

## 2022-06-27 DIAGNOSIS — C73 Malignant neoplasm of thyroid gland: Secondary | ICD-10-CM | POA: Diagnosis not present

## 2022-06-27 NOTE — Telephone Encounter (Signed)
Left message for patient to return call.

## 2022-06-27 NOTE — Progress Notes (Signed)
Patient ID: [name], female   DOB: [birth year], 56 y.o.   MRN: [id number]  HPI  [name] is a 56 y.o.-year-old female, returning for follow-up for thyroid cancer and uncontrolled postsurgical hypothyroidism.  She previously saw Dr. Loanne Drilling, last visit 10/2021.  Last visit with me 4 months ago.  Interim history: She continues to have fatigue and chronic cough when laying down - on Zyrtec. She also had some palpitations, heat intolerance and may be more anxiety.  I reviewed her thyroid cancer hx. -Per Dr. Cordelia Pen note and review of the chart: 2007 thyr NM scan consistent with Graves disease, except for 1 cold area 2008 Dominant 2.5 cm solid nodule in the inferior right lobe, corresponding to cold defect on NM. 2008 thyroid bx was suspicious.  2009 thyroidectomy (right lobectomy) --pathol c/w Grave's dz, with adenomatous nodule.  2/18 CT Multiple enlarged bilateral cervical lymph nodes. Residual 10 mm nodule at the thyroidectomy bed with similar nodules superior to the thyroid on the left, concerning for residual or recurrent thyroid tissue. Neoplasm is not excluded.  Single low-density noted in the posterior left level 2 station of undetermined significance. 3/18 LYMPH NODE , FNA, LEFT CERVICAL, STRONGLY FAVORS METASTATIC PTC: LYMPH NODE , FINE NEEDLE ASPIRATION LEFT CERVICAL NECK (SPECIMEN 2 OF 2 COLLECTED 01/12/2017) METASTATIC CARCINOMA. SEE COMMENT. COMMENT: THE FINDINGS STRONGLY FAVOR METASTATIC PAPILLARY THYROID CARCINOMA. DR. Herbie Baltimore HILLARD HAS REVIEWED THE CASE AND CONCURS WITH THIS INTERPRETATION. DR. Redmond Baseman WAS PAGED ON 01/13/2017. Specimen Clinical Information Left cervical lymph node, Patient status post thyroidectomy 2008 Source Lymph Node, Fine Needle Aspiration, Left Cervical, (Specimen 2 of 2, collected on 01/12/17 ) 3/18 THYROID, FINE NEEDLE ASPIRATION, LEFT [place] CATEGORY II: THYROID, FINE NEEDLE ASPIRATION, LEFT (SPECIMEN 1 OF 2 COLLECTED  01/12/2017) CONSISTENT WITH BENIGN FOLLICULAR TISSUE ([place] CATEGORY II). Specimen Clinical Information Nodule within bed of left thyroid, Post thyroidectomy 2008 Source Thyroid, Fine Needle Aspiration, Left, (Specimen 1 of 2, collected on 01/12/17 ) 5/18 NM scan: Expected tissue within the thyroid bed. There is a second adjacent smaller focus of less intense uptake.   6/18 left thyroid lobectomy (completion thyroidectomy), and LN's, Left zone 3 Neck Contents.  NO MALIGNANCY.  Dr. Melida Quitter: 1. Thyroid, lobectomy, Left - NODULAR HYPERPLASIA. - NO MALIGNANCY IDENTIFIED. 2. Lymph nodes, regional resection, Left zone 3 Neck Contents - BENIGN THYROID WITH NODULAR HYPERPLASIA. - NO LYMPH NODE TISSUE IDENTIFIED. - NO MALIGNANCY IDENTIFIED. 8/18 NM scan: abnormal focus in the right side of neck, suspicious for nodal metastasis.   9/18 [place]: 1 non pathologically enlarged lymph node.  10/21 [place]: normal (thyroidect) 9/22 [place]: 1. Surgical changes of total thyroidectomy without evidence of residual or recurrent thyroid tissue. 2. Bilateral prominent and borderline enlarged cervical chain lymph nodes. The node previously identified on the left remains unchanged at 0.4 cm in short axis. However, at least 1 additional new and slightly enlarged lymph node is now identified on both the left and the right. Differential considerations include reactive adenopathy, or less likely an early lymphoproliferative process, or metastatic disease. Reactive adenopathy is favored. However, continued clinical surveillance is recommended. If there is biochemical evidence of recurrent thyroid cancer, or if the lymph nodes demonstrate enlargement over time, repeat imaging and possibly tissue sampling may become warranted. 01/06/2022: RAI treatment with 108.2 mCi I-131 01/13/2022: Posttreatment whole-body scan: Focus of abnormal tracer in the right superior cervical region likely a lymph node metastasis  Lab Results   Component Value Date   THYROGLB 2.2 (L)  03/01/2022   Lab Results  Component Value Date   THGAB <1 03/01/2022   Pt denies: - feeling nodules in neck - hoarseness - dysphagia - choking  Postsurgical hypothyroidism -At last visit, she misunderstood instructions and she was on LT4 125 mcg (not taking 2x at a time as recommended!). -At that time, I advised her to increase the dose to 175 mcg daily -At this visit, she again misunderstood instructions and she is actually taking 2x 175 mcg LT4 daily!!! Moreover, she did not return for labs in 5 to 6 weeks as advised at last visit so she has been on this very high dose of LT4 for 4 months!  She takes levothyroxine - daily, no missed doses - fasting - with water - separated by 30 min-1h from b'fast  - no calcium, iron - + multivitamins 30 min to 1h after LT4 >> moved ~3h later - + Omeprazole at bedtime  I reviewed pt's thyroid tests: Lab Results  Component Value Date   TSH 29.49 (H) 03/01/2022   TSH 54.58 (H) 11/02/2021   TSH 23.96 (H) 07/01/2021   TSH 20.04 (H) 04/30/2021   TSH 6.46 (H) 07/21/2020   TSH 17.400 (H) 01/27/2020   TSH 17.98 (H) 07/11/2019   TSH 34.55 (H) 06/05/2019   TSH 2.69 08/24/2018   TSH 7.97 (H) 03/08/2018   FREET4 0.59 (L) 03/01/2022   FREET4 0.55 (L) 11/02/2021   FREET4 0.75 07/01/2021   FREET4 1.0 04/30/2021   FREET4 1.05 07/21/2020   FREET4 0.94 03/08/2018   FREET4 1.03 10/12/2017   FREET4 1.56 09/01/2017   FREET4 0.87 (L) 06/05/2007   FREET4 2.10 (H) 02/08/2007    At last visit, she mentioned: - fatigue - weight gain - cold intolerance - no constipation - dry skin - no hair loss  At this visit: - she lost 17 pounds - has occasional palpitations  - has heat intolerance - has anxiety  She has + FH of thyroid disorders in: sister. No FH of thyroid cancer.  No h/o radiation tx to head or neck besides RAI treatment.  She also has a history of HIV, BrCA - s/p RxTx, DM2, HL,  sarcoidosis.  ROS: + see HPI  Past Medical History:  Diagnosis Date   Allergy    Anemia    Normocytic   Antibiotic-induced yeast infection 09/28/2020   Anxiety    Asthma    Blood dyscrasia    Bronchitis 2005   Bursitis of left shoulder 09/06/2019   CKD (chronic kidney disease)    CLASS 1-EXOPHTHALMOS-THYROTOXIC 02/08/2007   COVID-19 virus infection 04/28/2022   Diabetes mellitus without complication ([place])    Family history of breast cancer    Family history of lung cancer    Family history of prostate cancer    Gastroenteritis 07/10/07   Genetic testing 07/25/2018   [employer] + RNA Insight was ordered.  Genes Analyzed (43 total): APC*, ATM*, AXIN2, BARD1, BMPR1A, BRCA1*, BRCA2*, BRIP1*, CDH1*, CDK4, CDKN2A, CHEK2*, DICER1, GALNT12, HOXB13, MEN1, MLH1*, MRE11A, MSH2*, MSH3, MSH6*, MUTYH*, NBN, NF1*, NTHL1, PALB2*, PMS2*, POLD1, POLE, PTEN*, RAD50, RAD51C*, RAD51D*, RET, SDHB, SDHD, SMAD4, SMARCA4, STK11 and TP53* (sequencing and deletion/duplication); EGFR (s   GERD 07/24/2006   GRAVE'S DISEASE 01/01/2008   History of hidradenitis suppurativa    History of kidney stones    History of thrush    HIV DISEASE 07/24/2006   dx March 05   Hyperlipidemia    HYPERTENSION 07/24/2006   Hyperthyroidism 08/2006   Grave's Disease -  diffuse radiotracer uptake 08/25/06 Thyroid scan-Cold nodule to R lower lobe of thyrorid   Menometrorrhagia    hx of   Nephrolithiasis    Panniculitis 05/12/2020   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   Personal history of chemotherapy    2020   Personal history of radiation therapy    2020   Pneumonia 2005   Port-A-Cath in place 07/12/2018   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   Sarcoidosis 02/08/2007   dx as a teenager in [place] from abnl CXR. Completed 2 yrs Prednisone  after lung bx confirmation. No symptoms since then.   Suppurative hidradenitis    Thyroid cancer ([place])    THYROID NODULE, RIGHT 02/08/2007   Past Surgical History:  Procedure Laterality Date   APPLICATION OF WOUND VAC N/A 01/20/2021   Procedure: APPLICATION OF WOUND VAC;  Surgeon: Wallace Going, DO;  Location: [facility];  Service: Plastics;  Laterality: N/A;   BREAST EXCISIONAL BIOPSY Right 04/26/2018   right axilla negative   BREAST EXCISIONAL BIOPSY Left 04/26/2018   left axilla negative   BREAST LUMPECTOMY Right 10/03/2018   malignant   BREAST LUMPECTOMY WITH RADIOACTIVE SEED AND SENTINEL LYMPH NODE BIOPSY Right 10/03/2018   Procedure: RIGHT BREAST LUMPECTOMY WITH RADIOACTIVE SEED AND SENTINEL LYMPH NODE MAPPING;  Surgeon: Erroll Luna, MD;  Location: [facility];  Service: General;  Laterality: Right;   BREAST SURGERY  1997   Breast Reduction    CYSTOSCOPY W/ URETERAL STENT REMOVAL  11/09/2012   Procedure: CYSTOSCOPY WITH STENT REMOVAL;  Surgeon: Alexis Frock, MD;  [facility];  Service: Urology;  Laterality: Right;   CYSTOSCOPY WITH RETROGRADE PYELOGRAM, URETEROSCOPY AND STENT PLACEMENT  11/09/2012   Procedure: CYSTOSCOPY WITH RETROGRADE PYELOGRAM, URETEROSCOPY AND STENT PLACEMENT;  Surgeon: Alexis Frock, MD;  [facility];  Service: Urology;  Laterality: Left;  LEFT URETEROSCOPY, STONE MANIPULATION, left STENT exchange    CYSTOSCOPY WITH STENT PLACEMENT  10/02/2012   Procedure: CYSTOSCOPY WITH STENT PLACEMENT;  Surgeon: Alexis Frock, MD;  [facility];  Service: Urology;  Laterality: Left;   DEBRIDEMENT AND CLOSURE WOUND N/A 01/20/2021   Procedure: Excision of abdominal wound with closure;  Surgeon: Wallace Going, DO;  Location: [facility];  Service: Plastics;  Laterality: N/A;   DILATION AND CURETTAGE OF UTERUS  Feb 2004   s/p for 1st trimester nonviable pregnancy   EYE SURGERY     sty under eyelid   INCISE AND DRAIN ABCESS   Nov 03   s/p I &D for righ inframmary fold hidradenitis   INCISION AND DRAINAGE PERITONSILLAR ABSCESS  Mar 03   IR CV LINE INJECTION  06/07/2018   IR IMAGING GUIDED PORT INSERTION  06/20/2018   IR REMOVAL TUN ACCESS W/ PORT W/O FL MOD SED  06/20/2018   IRRIGATION AND DEBRIDEMENT ABSCESS  01/31/2012   Procedure: IRRIGATION AND DEBRIDEMENT ABSCESS;  Surgeon: Shann Medal, MD;  [facility];  Service: General;  Laterality: Right;  right breast and axilla    NEPHROLITHOTOMY  10/02/2012   Procedure: NEPHROLITHOTOMY PERCUTANEOUS;  Surgeon: Alexis Frock, MD;  [facility];  Service: Urology;  Laterality: Right;  First Stage Percutaneous Nephrolithotomy with Surgeon Access, Left Ureteral Stent     NEPHROLITHOTOMY  10/04/2012   Procedure: NEPHROLITHOTOMY PERCUTANEOUS SECOND LOOK;  Surgeon: Alexis Frock, MD;  Location[facility];  Service: Urology;  Laterality: Right;      NEPHROLITHOTOMY  10/08/2012   Procedure: NEPHROLITHOTOMY PERCUTANEOUS;  Surgeon: Alexis Frock, MD;  [facility];  Service: Urology;  Laterality: Right;  THIRD STAGE, nephrostomy tube exchange x 2   NEPHROLITHOTOMY  10/11/2012   Procedure: NEPHROLITHOTOMY PERCUTANEOUS SECOND LOOK;  Surgeon: Alexis Frock, MD;  [facility];  Service: Urology;  Laterality: Right;  RIGHT 4 STAGE PERCUTANOUS NEPHROLITHOTOMY, right URETEROSCOPY WITH HOLMIUM LASER    PANNICULECTOMY N/A 12/21/2020   Procedure: PANNICULECTOMY;  Surgeon: Wallace Going, DO;  Location: [facility];  Service: Plastics;  Laterality: N/A;   PORT-A-CATH REMOVAL N/A 07/16/2020   Procedure: REMOVAL PORT-A-CATH;  Surgeon: Erroll Luna, MD;  Location: [facility];  Service: General;  Laterality: N/A;   PORTACATH PLACEMENT Left 05/17/2018   Procedure: INSERTION PORT-A-CATH;  Surgeon: Coralie Keens, MD;  Location: [facility];  Service: General;  Laterality: Left;   RADICAL NECK DISSECTION  04/12/2017   limited/notes 04/12/2017   RADICAL  NECK DISSECTION N/A 04/12/2017   Procedure: RADICAL NECK DISSECTION;  Surgeon: Melida Quitter, MD;  Location: [facility];  Service: ENT;  Laterality: N/A;  limited neck dissection 2 hours total   REDUCTION MAMMAPLASTY Bilateral 1998   RIGHT/LEFT HEART CATH AND CORONARY ANGIOGRAPHY N/A 03/12/2020   Procedure: RIGHT/LEFT HEART CATH AND CORONARY ANGIOGRAPHY;  Surgeon: Jolaine Artist, MD;  Location: [facility];  Service: Cardiovascular;  Laterality: N/A;   [place]  04/12/2017   completion/notes 04/12/2017   THYROIDECTOMY N/A 04/12/2017   Procedure: THYROIDECTOMY;  Surgeon: Melida Quitter, MD;  Location: [facility];  Service: ENT;  Laterality: N/A;  Completion Thyroidectomy   TOTAL THYROIDECTOMY  2010   Social History   Socioeconomic History   Marital status: Single    Spouse name: Not on file   Number of children: 1   Years of education: Not on file   Highest education level: Not on file  Occupational History   Occupation: Secondary school teacher  Tobacco Use   Smoking status: Former    Packs/day: 0.50    Years: 15.00    Total pack years: 7.50    Types: Cigarettes    Start date: 04/12/2017    Quit date: 2019    Years since quitting: 4.6   Smokeless tobacco: Never  Vaping Use   Vaping Use: Never used  Substance and Sexual Activity   Alcohol use: Yes    Alcohol/week: 0.0 standard drinks of alcohol    Comment: social   Drug use: No   Sexual activity: Not Currently    Birth control/protection: Post-menopausal    Comment: declined condoms  Other Topics Concern   Not on file  Social History Narrative   Right Handed    Lives in a two story home   Social Determinants of Health   Financial Resource Strain: Not on file  Food Insecurity: Not on file  Transportation Needs: No Transportation Needs (10/25/2018)   PRAPARE - Hydrologist (Medical): No    Lack of Transportation (Non-Medical): No  Physical Activity: Not on file  Stress: Not on  file  Social Connections: Not on file  Intimate Partner Violence: Not At Risk (10/25/2018)   Humiliation, Afraid, Rape, and Kick questionnaire    Fear of Current or Ex-Partner: No    Emotionally Abused: No    Physically Abused: No    Sexually Abused: No   Current Outpatient  Medications on File Prior to Visit  Medication Sig Dispense Refill   acetaminophen (TYLENOL) 500 MG tablet Take 1,000 mg by mouth every 6 (six) hours as needed for mild pain.     albuterol (VENTOLIN HFA) 108 (90 Base) MCG/ACT inhaler Inhale 2 puffs into the lungs every 6 (six) hours as needed for wheezing or shortness of breath. 8 g 2   allopurinol (ZYLOPRIM) 100 MG tablet TAKE 1 TABLET BY MOUTH EVERY DAY FOR GOUT PREVENTION 90 tablet 1   Blood Glucose Monitoring Suppl (FREESTYLE LITE) w/Device KIT Inject 1 each into the skin daily. 1 kit 0   candesartan (ATACAND) 32 MG tablet Take 1 tablet (32 mg total) by mouth daily. 90 tablet 1   carvedilol (COREG) 25 MG tablet Take 1 tablet (25 mg total) by mouth 2 (two) times daily. 180 tablet 3   cetirizine (ZYRTEC) 10 MG tablet Take 1 tablet by mouth at bedtime for allergies. 90 tablet 3   dolutegravir ([name]) 50 MG tablet Take 1 tablet (50 mg total) by mouth daily. 30 tablet 11   emtricitabine-tenofovir AF (DESCOVY) 200-25 MG tablet Take 1 tablet by mouth daily. 30 tablet 11   FLOVENT HFA 110 MCG/ACT inhaler TAKE 1 PUFF BY MOUTH TWICE A DAY (Patient taking differently: Inhale 1 puff into the lungs 2 (two) times daily as needed (wheezing/shortness of breath).) 36 Inhaler 1   fluticasone (FLONASE) 50 MCG/ACT nasal spray Place 2 sprays into both nostrils daily. 16 g 6   gabapentin (NEURONTIN) 300 MG capsule Take 300 mg by mouth daily.     glipiZIDE (GLUCOTROL) 10 MG tablet TAKE 1 TABLET BY MOUTH TWICE A DAY FOR DIABETES 180 tablet 1   glucose blood (ONETOUCH VERIO) test strip USE AS INSTRUCTED TO TEST BLOOD SUGAR up to 3 times daily. 300 each 3   guaiFENesin-codeine (ROBITUSSIN AC)  100-10 MG/5ML syrup Take 5 mLs by mouth 3 (three) times daily as needed for cough. 75 mL 0   hydrALAZINE (APRESOLINE) 50 MG tablet Take 1 tablet (50 mg total) by mouth in the morning and at bedtime. 60 tablet 6   hydrOXYzine (ATARAX) 10 MG tablet Take 1 tablet (10 mg total) by mouth 2 (two) times daily as needed for itching. 30 tablet 0   Insulin Glargine (BASAGLAR KWIKPEN) 100 UNIT/ML Inject 10 Units into the skin at bedtime. For diabetes. 15 mL 0   Insulin Pen Needle (PEN NEEDLES) 31G X 6 MM MISC Use nightly with insulin. 100 each 2   Lancets (ONETOUCH ULTRASOFT) lancets Use as instructed to test blood sugar daily 100 each 5   levothyroxine (SYNTHROID) 175 MCG tablet Take 1 tablet (175 mcg total) by mouth daily. 60 tablet 3   minocycline (DYNACIN) 100 MG tablet Take 1 tablet (100 mg total) by mouth 2 (two) times daily. 60 tablet 11   omeprazole (PRILOSEC) 40 MG capsule TAKE 1 CAPSULE BY MOUTH DAILY FOR REFLUX 90 capsule 3   predniSONE (DELTASONE) 20 MG tablet Take 2 tablets by mouth once daily for 5 days. (Patient not taking: Reported on 06/09/2022) 10 tablet 0   rosuvastatin (CRESTOR) 10 MG tablet TAKE 1 TABLET BY MOUTH EVERY DAY FOR CHOLESTEROL 90 tablet 0   sertraline (ZOLOFT) 50 MG tablet TAKE 1 TABLET (50 MG TOTAL) BY MOUTH DAILY. FOR ANXIETY AND DEPRESSION. 90 tablet 3   spironolactone (ALDACTONE) 25 MG tablet Take 1 tablet (25 mg total) by mouth daily. 90 tablet 3   tamoxifen (NOLVADEX) 20 MG tablet Take 1  tablet (20 mg total) by mouth daily. 90 tablet 4   [DISCONTINUED] prochlorperazine (COMPAZINE) 10 MG tablet TAKE 1 TABLET BY MOUTH EVERY 6 HOURS AS NEEDED FOR NAUSEA OR VOMITING 30 tablet 2   Current Facility-Administered Medications on File Prior to Visit  Medication Dose Route Frequency Provider Last Rate Last Admin   sodium chloride flush (NS) 0.9 % injection 10 mL  10 mL Intracatheter PRN Magrinat, Virgie Dad, MD   10 mL at 09/19/18 1551   Allergies  Allergen Reactions   Genvoya  [Elviteg-Cobic-Emtricit-Tenofaf] Hives   Lisinopril Cough   Tape Rash    Rash  Okay with tegaderm and paper tape   Tegaderm Ag Mesh [Silver] Itching   Family History  Problem Relation Age of Onset   Hypertension Mother    Cancer Mother        laryngeal   Heart disease Mother        stent   Hypertension Father    Lung cancer Father 77       hx smoking   Heart disease Other    Hypertension Other    Stroke Other        Grandparent   Kidney disease Other        Grandparent   Diabetes Other        FH of Diabetes   Hypertension Sister    Cancer Maternal Uncle        Lung CA   Hypertension Brother    Hypertension Sister    Breast cancer Maternal Aunt 77   Breast cancer Paternal Aunt 37   Prostate cancer Paternal Uncle    Breast cancer Maternal Aunt        dx 60+   Breast cancer Paternal Aunt        dx 24's   Breast cancer Paternal Aunt        dx 50's   Prostate cancer Paternal Uncle    Lung cancer Paternal Uncle    Breast cancer Cousin 30   Breast cancer Cousin        dx <50   Breast cancer Cousin        dx <50   Breast cancer Cousin        dx <50   Colon cancer Neg Hx    Esophageal cancer Neg Hx    Rectal cancer Neg Hx    Stomach cancer Neg Hx    PE: BP 126/74 (BP Location: Right Arm, Patient Position: Sitting, Cuff Size: Normal)   Pulse 85   Ht $R'5\' 5"'Az$  (1.651 m)   Wt 221 lb (100.2 kg)   LMP 03/31/2014 (LMP Unknown)   SpO2 94%   BMI 36.78 kg/m  Wt Readings from Last 3 Encounters:  06/27/22 221 lb (100.2 kg)  06/09/22 217 lb (98.4 kg)  05/18/22 221 lb (100.2 kg)   Constitutional: overweight, in NAD Eyes: EOMI, no exophthalmos ENT: moist mucous membranes, no neck masses palpated, thyroidectomy scar inconspicuous, no cervical lymphadenopathy Cardiovascular: RRR, No MRG Respiratory: CTA B Musculoskeletal: no deformities Skin: moist, warm, no rashes Neurological: no tremor with outstretched hands  ASSESSMENT: 1. Thyroid cancer - see HPI  2.  Postsurgical Hypothyroidism -Uncontrolled  3. DM2  PLAN:  1. Thyroid cancer - papillary -Patient with a interesting long history of metastatic papillary thyroid cancer, detected in cervical nodes, without thyroid primary tumor found.  There is no evidence of a thyroglossal duct cyst or ectopic thyroid tissue on imaging. - she initially had right lobectomy in  2009 which was negative for thyroid cancer.  After having had a lymph node biopsy showing PTC metastasis in the left lateral neck, she had a completion thyroidectomy by Dr. Redmond Baseman in 2018.  Very interestingly, the final pathology showed no malignancy.  -She had RAI treatment 1.5 months before our last visit and a posttreatment whole-body scan showed a focus of abnormal tracer in the right superior cervical neck, consistent with a lymph node metastasis. We discussed that the RAI treatment may have been related this focus -At last visit we checked her thyroglobulin and ATA antibodies.  Antibodies were undetectable while thyroglobulin was very low.  We will continue to follow the trend. - Plan to get another whole-body scan a year after the previous - I will see her back in 6 months  2.  Patient with h/o total thyroidectomy for cancer, now with iatrogenic hypothyroidism, on levothyroxine therapy -At last visit, she was supposed to be on 250 mcg levothyroxine daily, but she was only taking one of the 2 tablets of 125 mcg daily as she misunderstood instructions -At time, TSH was very high: Lab Results  Component Value Date   TSH 29.49 (H) 03/01/2022  - I advised her to increase her levothyroxine dose to 175 mcg daily.  She misunderstood the instructions and she is actually taking 2 of these tablets daily (total 350 mcg daily!!) - Unfortunately, she did not return for labs afterwards - pt feels good on this dose but has palpitations. She lost 17 lbs since last OV and has some anxiety also.  We discussed that all of these can be signs of over  replacement with levothyroxine.  We will hold off the dose of levothyroxine for the next 2 days and then restart at the lower dose of 175 mcg daily. - we discussed about taking the thyroid hormone every day, with water, >30 minutes before breakfast, separated by >4 hours from acid reflux medications, calcium, iron, multivitamins.  At last visit, she was taking her vitamin D is too close to levothyroxine and I advised her to move these at least 4 hours later.  She is not taking them approximately 3 hours later. - will check thyroid tests in 6 weeks after decreasing the dose back to 175 mcg daily: TSH and fT4 - If labs are abnormal, she will need to return for repeat TFTs in 1.5 months  3. DM2 -This is managed by PCP -HbA1c was checked by mistake today but this is much better, at 6.8%, previously 8% -We will forward these results to PCP.  Patient Instructions  Skip Levothyroxine for the next 2 days, then resume Levothyroxine 175 mcg daily.  Take the thyroid hormone every day, with water, at least 30 minutes before breakfast, separated by at least 4 hours from: - acid reflux medications - calcium - iron - multivitamins  Please come back for labs in ~6 weeks.  You should have an endocrinology follow-up appointment in 6 months.  Orders Placed This Encounter  Procedures   Thyroglobulin antibody   Thyroglobulin Level   TSH   T4, free   Philemon Kingdom, MD PhD [facility] Endocrinology

## 2022-06-27 NOTE — Patient Instructions (Addendum)
Skip Levothyroxine for the next 2 days, then resume Levothyroxine 175 mcg daily.  Take the thyroid hormone every day, with water, at least 30 minutes before breakfast, separated by at least 4 hours from: - acid reflux medications - calcium - iron - multivitamins  Please come back for labs in ~6 weeks.  You should have an endocrinology follow-up appointment in 6 months.

## 2022-06-28 NOTE — Telephone Encounter (Signed)
Rx(s) sent to pharmacy electronically.  

## 2022-06-28 NOTE — Telephone Encounter (Signed)
Informed patient of [name] response. She will do some research and then let us know how we can help with that.

## 2022-07-01 ENCOUNTER — Other Ambulatory Visit (HOSPITAL_COMMUNITY): Payer: Self-pay | Admitting: *Deleted

## 2022-07-04 ENCOUNTER — Encounter (HOSPITAL_COMMUNITY)
Admission: RE | Admit: 2022-07-04 | Discharge: 2022-07-04 | Disposition: A | Discharge status: Home / Self Care | Payer: Managed Care, Other (non HMO) | Source: Ambulatory Visit | Attending: Nephrology | Admitting: Nephrology

## 2022-07-04 DIAGNOSIS — D631 Anemia in chronic kidney disease: Secondary | ICD-10-CM | POA: Insufficient documentation

## 2022-07-04 DIAGNOSIS — N189 Chronic kidney disease, unspecified: Secondary | ICD-10-CM | POA: Diagnosis not present

## 2022-07-04 MED ORDER — SODIUM CHLORIDE 0.9 % IV SOLN
510.0000 mg | INTRAVENOUS | Status: DC
  Administered 2022-07-04: 510 mg via INTRAVENOUS
  Filled 2022-07-04: qty 510

## 2022-07-11 ENCOUNTER — Encounter (HOSPITAL_COMMUNITY)
Admission: RE | Admit: 2022-07-11 | Discharge: 2022-07-11 | Disposition: A | Discharge status: Home / Self Care | Payer: Managed Care, Other (non HMO) | Source: Ambulatory Visit | Attending: Nephrology | Admitting: Nephrology

## 2022-07-11 DIAGNOSIS — N189 Chronic kidney disease, unspecified: Secondary | ICD-10-CM | POA: Diagnosis not present

## 2022-07-11 MED ORDER — SODIUM CHLORIDE 0.9 % IV SOLN
510.0000 mg | INTRAVENOUS | Status: DC
  Administered 2022-07-11: 510 mg via INTRAVENOUS
  Filled 2022-07-11: qty 17

## 2022-07-12 ENCOUNTER — Encounter: Payer: Self-pay | Admitting: Oncology

## 2022-07-22 ENCOUNTER — Telehealth: Payer: Self-pay | Admitting: Primary Care

## 2022-07-22 DIAGNOSIS — U071 COVID-19: Secondary | ICD-10-CM

## 2022-07-22 NOTE — Telephone Encounter (Signed)
  Encourage patient to contact the pharmacy for refills or they can request refills through [facility]:  Please schedule appointment if longer than 1 year  NEXT APPOINTMENT DATE:  MEDICATION:predniSONE (DELTASONE) 20 MG tablet   Is the patient out of medication?   PHARMACY:[id number] - [phone]  Let patient know to contact pharmacy at the end of the day to make sure medication is ready.  Please notify patient to allow 48-72 hours to process  CLINICAL FILLS OUT ALL BELOW:   LAST REFILL:  QTY:  REFILL DATE:    OTHER COMMENTS:    Okay for refill?  Please advise

## 2022-07-25 MED ORDER — PREDNISONE 20 MG PO TABS: ORAL_TABLET | ORAL | 0 refills | Status: DC

## 2022-07-25 NOTE — Telephone Encounter (Signed)
Noted. Refill provided. Will also see her later this week as scheduled.

## 2022-07-25 NOTE — Telephone Encounter (Signed)
Called patient states she would like refill due to her gout. She states she has been drinking cherry tart and taking ibuprofen but not having any improvement.

## 2022-07-29 ENCOUNTER — Ambulatory Visit (INDEPENDENT_AMBULATORY_CARE_PROVIDER_SITE_OTHER)
Admission: RE | Admit: 2022-07-29 | Discharge: 2022-07-29 | Disposition: A | Discharge status: Home / Self Care | Payer: Commercial Managed Care - HMO | Source: Ambulatory Visit | Attending: Primary Care | Admitting: Primary Care

## 2022-07-29 ENCOUNTER — Encounter: Payer: Self-pay | Admitting: Oncology

## 2022-07-29 ENCOUNTER — Encounter: Payer: Self-pay | Admitting: Primary Care

## 2022-07-29 ENCOUNTER — Ambulatory Visit (INDEPENDENT_AMBULATORY_CARE_PROVIDER_SITE_OTHER): Payer: Commercial Managed Care - HMO | Admitting: Primary Care

## 2022-07-29 VITALS — BP 148/84 | HR 82 | Temp 97.7°F | Ht 65.0 in | Wt 216.0 lb

## 2022-07-29 DIAGNOSIS — M25561 Pain in right knee: Secondary | ICD-10-CM | POA: Diagnosis not present

## 2022-07-29 DIAGNOSIS — E119 Type 2 diabetes mellitus without complications: Secondary | ICD-10-CM

## 2022-07-29 DIAGNOSIS — E785 Hyperlipidemia, unspecified: Secondary | ICD-10-CM | POA: Diagnosis not present

## 2022-07-29 DIAGNOSIS — M25562 Pain in left knee: Secondary | ICD-10-CM

## 2022-07-29 DIAGNOSIS — G8929 Other chronic pain: Secondary | ICD-10-CM | POA: Diagnosis not present

## 2022-07-29 LAB — POCT GLYCOSYLATED HEMOGLOBIN (HGB A1C): Hemoglobin A1C: 6.7 % — AB (ref 4.0–5.6)

## 2022-07-29 LAB — MICROALBUMIN / CREATININE URINE RATIO
Creatinine,U: 172.3 mg/dL
Microalb Creat Ratio: 54.1 mg/g — ABNORMAL HIGH (ref 0.0–30.0)
Microalb, Ur: 93.2 mg/dL — ABNORMAL HIGH (ref 0.0–1.9)

## 2022-07-29 LAB — LIPID PANEL
Cholesterol: 134 mg/dL (ref 0–200)
HDL: 35.9 mg/dL — ABNORMAL LOW (ref >39.00)
LDL Cholesterol: 77 mg/dL (ref 0–99)
NonHDL: 97.67
Total CHOL/HDL Ratio: 4
Triglycerides: 103 mg/dL (ref 0.0–149.0)
VLDL: 20.6 mg/dL (ref 0.0–40.0)

## 2022-07-29 NOTE — Assessment & Plan Note (Signed)
Improved with A1C today of 6.7!  Interestingly, she has not had insulin in 1 month.  We decided to continue Glipizide 10 mg BID for now. Hold off on Basalgar for now.  She will continue to monitor glucose readings and notify if glucose readings are trending upward.  We discussed appropriate times to check glucose readings.  Urine microalbumin pending today.  Follow-up in 3 months.

## 2022-07-29 NOTE — Progress Notes (Signed)
Subjective:    Patient ID: [name], female    DOB: 08-Jan-1966, 56 y.o.   MRN: [id number]  HPI  [name] is a very pleasant 56 y.o. female with a significant medical history including type 2 diabetes, thyroid cancer, breast cancer, HIV, CKD, hypertension, neuropathy, anxiety depression who presents today for follow-up of diabetes and repeat lipid panel.  She would also like to discuss chronic back and knee pain.   1) Type 2 Diabetes:  Current medications include: Glipizide 10 mg twice daily, Basaglar insulin 10 units at bedtime.  She's not injected her insulin in 1 month.   She is checking her blood glucose 4-5 times daily and is getting readings of:   AM fasting: 110's-120's 1 hour after meals: 220's  She experiences glucose drops in the 70's to 80's a few times weekly when she forgets to eat.   Last A1C: 8.0 in July 2023, 6.7 today  Last Eye Exam: UTD Last Foot Exam: Due Pneumonia Vaccination: UTD Urine Microalbumin: Due Statin: rosuvastatin   Dietary changes since last visit: She is eating less fried food, she is limiting starches, increased oatmeal, seldomly drinks sugary drinks.   Decreased appetite.    Exercise: Walking some.    Wt Readings from Last 3 Encounters:  07/29/22 216 lb (98 kg)  06/27/22 221 lb (100.2 kg)  06/09/22 217 lb (98.4 kg)   2) Chronic Knee Pain: Chronic for years, located bilaterally. She has to hold the railings and pull herself up the stairs when walking upstairs at home.  She also has pain and difficulty when rising from a seated position (toilet seats, low riding cars, sitting in most chairs). Progressing over the last few months.   She's been largely sedentary for months, was going days without leaving her home. Since she's been more active she's noticed increased pain. She denies swelling, color change.     Review of Systems  Respiratory:  Negative for shortness of breath.   Cardiovascular:  Negative for  chest pain.  Musculoskeletal:  Positive for arthralgias and back pain.  Skin:  Negative for color change.  Neurological:  Positive for numbness. Negative for headaches.         Past Medical History:  Diagnosis Date   Allergy    Anemia    Normocytic   Antibiotic-induced yeast infection 09/28/2020   Anxiety    Asthma    Blood dyscrasia    Bronchitis 2005   Bursitis of left shoulder 09/06/2019   CKD (chronic kidney disease)    CLASS 1-EXOPHTHALMOS-THYROTOXIC 02/08/2007   COVID-19 virus infection 04/28/2022   Diabetes mellitus without complication ([place])    Family history of breast cancer    Family history of lung cancer    Family history of prostate cancer    Gastroenteritis 07/10/07   Genetic testing 07/25/2018   [employer] + RNA Insight was ordered.  Genes Analyzed (43 total): APC*, ATM*, AXIN2, BARD1, BMPR1A, BRCA1*, BRCA2*, BRIP1*, CDH1*, CDK4, CDKN2A, CHEK2*, DICER1, GALNT12, HOXB13, MEN1, MLH1*, MRE11A, MSH2*, MSH3, MSH6*, MUTYH*, NBN, NF1*, NTHL1, PALB2*, PMS2*, POLD1, POLE, PTEN*, RAD50, RAD51C*, RAD51D*, RET, SDHB, SDHD, SMAD4, SMARCA4, STK11 and TP53* (sequencing and deletion/duplication); EGFR (s   GERD 07/24/2006   GRAVE'S DISEASE 01/01/2008   History of hidradenitis suppurativa    History of kidney stones    History of thrush    HIV DISEASE 07/24/2006   dx March 05   Hyperlipidemia    HYPERTENSION 07/24/2006   Hyperthyroidism 08/2006   Grave's  Disease -diffuse radiotracer uptake 08/25/06 Thyroid scan-Cold nodule to R lower lobe of thyrorid   Menometrorrhagia    hx of   Nephrolithiasis    Panniculitis 05/12/2020   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 Completed 2 yrs Prednisone after lung bx confirmation. No symptoms since then.   Suppurative hidradenitis    Thyroid cancer ([place])    THYROID NODULE, RIGHT 02/08/2007    Social History   Socioeconomic History   Marital status: Single    Spouse name: Not on file   Number of children: 1   Years of education: Not on file   Highest education level: Not on file  Occupational History   Occupation: Secondary school teacher  Tobacco Use   Smoking status: Former    Packs/day: 0.50    Years: 15.00    Total pack years: 7.50    Types: Cigarettes    Start date: 04/12/2017    Quit date: 2019    Years since quitting: 4.7   Smokeless tobacco: Never  Vaping Use   Vaping Use: Never used  Substance and Sexual Activity   Alcohol use: Yes    Alcohol/week: 0.0 standard drinks of alcohol    Comment: social   Drug use: No   Sexual activity: Not Currently    Birth control/protection: Post-menopausal    Comment: declined condoms  Other Topics Concern   Not on file  Social History Narrative   Right Handed    Lives in a two story home   Social Determinants of Health   Financial Resource Strain: Not on file  Food Insecurity: Not on file  Transportation Needs: No Transportation Needs (10/25/2018)   PRAPARE - Hydrologist (Medical): No    Lack of Transportation (Non-Medical): No  Physical Activity: Not on file  Stress: Not on file  Social Connections: Not on file  Intimate Partner Violence: Not At Risk (10/25/2018)   Humiliation, Afraid, Rape, and Kick questionnaire    Fear of Current or Ex-Partner: No    Emotionally Abused: No    Physically Abused: No    Sexually Abused: No    Past Surgical History:  Procedure Laterality Date   APPLICATION OF WOUND VAC N/A 01/20/2021   Procedure: APPLICATION OF WOUND VAC;  Surgeon: Wallace Going, DO;  Location:  [facility];  Service: Plastics;  Laterality: N/A;   BREAST EXCISIONAL BIOPSY Right 04/26/2018   right axilla negative   BREAST EXCISIONAL BIOPSY Left 04/26/2018   left axilla negative   BREAST LUMPECTOMY Right 10/03/2018   malignant   BREAST LUMPECTOMY WITH RADIOACTIVE SEED AND SENTINEL LYMPH NODE BIOPSY Right 10/03/2018   Procedure: RIGHT BREAST LUMPECTOMY WITH RADIOACTIVE SEED AND SENTINEL LYMPH NODE MAPPING;  Surgeon: Erroll Luna, MD;  Location: [facility];  Service: General;  Laterality: Right;   BREAST SURGERY  1997   Breast Reduction    CYSTOSCOPY W/ URETERAL STENT REMOVAL  11/09/2012   Procedure: CYSTOSCOPY WITH STENT REMOVAL;  Surgeon: Alexis Frock, MD;  Location: [facility];  Service: Urology;  Laterality: Right;   CYSTOSCOPY WITH RETROGRADE PYELOGRAM, URETEROSCOPY AND STENT PLACEMENT  11/09/2012   Procedure: CYSTOSCOPY WITH RETROGRADE PYELOGRAM, URETEROSCOPY AND STENT PLACEMENT;  Surgeon: Alexis Frock, MD;  Location: [facility];  Service: Urology;  Laterality: Left;  LEFT URETEROSCOPY, STONE MANIPULATION, left STENT exchange    CYSTOSCOPY WITH STENT PLACEMENT  10/02/2012   Procedure: CYSTOSCOPY WITH STENT PLACEMENT;  Surgeon: Alexis Frock, MD;  Location: [facility];  Service: Urology;  Laterality: Left;   DEBRIDEMENT AND CLOSURE WOUND N/A 01/20/2021   Procedure: Excision of abdominal wound with closure;  Surgeon: Wallace Going, DO;  Location: [facility];  Service: Plastics;  Laterality: N/A;   DILATION AND CURETTAGE OF UTERUS  Feb 2004   s/p for 1st trimester nonviable pregnancy   EYE SURGERY     sty under eyelid   INCISE AND DRAIN ABCESS  Nov 03   s/p I &D for righ inframmary fold hidradenitis   INCISION AND DRAINAGE PERITONSILLAR ABSCESS  Mar 03   IR CV LINE INJECTION  06/07/2018   IR IMAGING GUIDED PORT INSERTION  06/20/2018   IR REMOVAL TUN ACCESS W/ PORT W/O FL MOD SED  06/20/2018   IRRIGATION AND DEBRIDEMENT ABSCESS  01/31/2012   Procedure:  IRRIGATION AND DEBRIDEMENT ABSCESS;  Surgeon: Shann Medal, MD;  Location: [facility];  Service: General;  Laterality: Right;  right breast and axilla    NEPHROLITHOTOMY  10/02/2012   Procedure: NEPHROLITHOTOMY PERCUTANEOUS;  Surgeon: Alexis Frock, MD;  Location: [facility];  Service: Urology;  Laterality: Right;  First Stage Percutaneous Nephrolithotomy with Surgeon Access, Left Ureteral Stent     NEPHROLITHOTOMY  10/04/2012   Procedure: NEPHROLITHOTOMY PERCUTANEOUS SECOND LOOK;  Surgeon: Alexis Frock, MD;  Location: [facility];  Service: Urology;  Laterality: Right;      NEPHROLITHOTOMY  10/08/2012   Procedure: NEPHROLITHOTOMY PERCUTANEOUS;  Surgeon: Alexis Frock, MD;  Location: [facility];  Service: Urology;  Laterality: Right;  THIRD STAGE, nephrostomy tube exchange x 2   NEPHROLITHOTOMY  10/11/2012   Procedure: NEPHROLITHOTOMY PERCUTANEOUS SECOND LOOK;  Surgeon: Alexis Frock, MD;  Location[facility];  Service: Urology;  Laterality: Right;  RIGHT 4 STAGE PERCUTANOUS NEPHROLITHOTOMY, right URETEROSCOPY WITH HOLMIUM LASER    PANNICULECTOMY N/A 12/21/2020   Procedure: PANNICULECTOMY;  Surgeon: Wallace Going, DO;  Location: [facility];  Service: Plastics;  Laterality: N/A;   PORT-A-CATH REMOVAL N/A 07/16/2020   Procedure: REMOVAL PORT-A-CATH;  Surgeon: Erroll Luna, MD;  Location: [facility];  Service: General;  Laterality: N/A;   PORTACATH PLACEMENT Left 05/17/2018   Procedure: INSERTION PORT-A-CATH;  Surgeon: Coralie Keens, MD;  Location: [facility];  Service: General;  Laterality: Left;   RADICAL NECK DISSECTION  04/12/2017   limited/notes 04/12/2017   RADICAL NECK DISSECTION N/A 04/12/2017   Procedure: RADICAL NECK DISSECTION;  Surgeon: Melida Quitter, MD;  Location: [facility];  Service: ENT;  Laterality: N/A;  limited neck dissection 2 hours total   REDUCTION MAMMAPLASTY Bilateral 1998   RIGHT/LEFT HEART CATH AND CORONARY ANGIOGRAPHY N/A 03/12/2020   Procedure:  RIGHT/LEFT HEART CATH AND CORONARY ANGIOGRAPHY;  Surgeon: Jolaine Artist, MD;  Location: [facility];  Service: Cardiovascular;  Laterality: N/A;   [place]  04/12/2017   completion/notes 04/12/2017   THYROIDECTOMY N/A 04/12/2017   Procedure: THYROIDECTOMY;  Surgeon: Melida Quitter, MD;  Location: [facility];  Service: ENT;  Laterality: N/A;  Completion Thyroidectomy   TOTAL THYROIDECTOMY  2010  Family History  Problem Relation Age of Onset   Hypertension Mother    Cancer Mother        laryngeal   Heart disease Mother        stent   Hypertension Father    Lung cancer Father 16       hx smoking   Heart disease Other    Hypertension Other    Stroke Other        Grandparent   Kidney disease Other        Grandparent   Diabetes Other        FH of Diabetes   Hypertension Sister    Cancer Maternal Uncle        Lung CA   Hypertension Brother    Hypertension Sister    Breast cancer Maternal Aunt 65   Breast cancer Paternal Aunt 34   Prostate cancer Paternal Uncle    Breast cancer Maternal Aunt        dx 60+   Breast cancer Paternal Aunt        dx 69's   Breast cancer Paternal Aunt        dx 50's   Prostate cancer Paternal Uncle    Lung cancer Paternal Uncle    Breast cancer Cousin 41   Breast cancer Cousin        dx <50   Breast cancer Cousin        dx <50   Breast cancer Cousin        dx <50   Colon cancer Neg Hx    Esophageal cancer Neg Hx    Rectal cancer Neg Hx    Stomach cancer Neg Hx     Allergies  Allergen Reactions   Genvoya [Elviteg-Cobic-Emtricit-Tenofaf] Hives   Lisinopril Cough   Tape Rash    Rash  Okay with tegaderm and paper tape   Tegaderm Ag Mesh [Silver] Itching    Current Outpatient Medications on File Prior to Visit  Medication Sig Dispense Refill   acetaminophen (TYLENOL) 500 MG tablet Take 1,000 mg by mouth every 6 (six) hours as needed for mild pain.     albuterol (VENTOLIN HFA) 108 (90 Base) MCG/ACT inhaler  Inhale 2 puffs into the lungs every 6 (six) hours as needed for wheezing or shortness of breath. 8 g 2   allopurinol (ZYLOPRIM) 100 MG tablet TAKE 1 TABLET BY MOUTH EVERY DAY FOR GOUT PREVENTION 90 tablet 1   Blood Glucose Monitoring Suppl (FREESTYLE LITE) w/Device KIT Inject 1 each into the skin daily. 1 kit 0   candesartan (ATACAND) 32 MG tablet TAKE 1 TABLET BY MOUTH DAILY. 30 tablet 5   carvedilol (COREG) 25 MG tablet Take 1 tablet (25 mg total) by mouth 2 (two) times daily. 180 tablet 3   cetirizine (ZYRTEC) 10 MG tablet Take 1 tablet by mouth at bedtime for allergies. 90 tablet 3   dolutegravir ([name]) 50 MG tablet Take 1 tablet (50 mg total) by mouth daily. 30 tablet 11   emtricitabine-tenofovir AF (DESCOVY) 200-25 MG tablet Take 1 tablet by mouth daily. 30 tablet 11   FLOVENT HFA 110 MCG/ACT inhaler TAKE 1 PUFF BY MOUTH TWICE A DAY (Patient taking differently: Inhale 1 puff into the lungs 2 (two) times daily as needed (wheezing/shortness of breath).) 36 Inhaler 1   fluticasone (FLONASE) 50 MCG/ACT nasal spray Place 2 sprays into both nostrils daily. 16 g 6   gabapentin (NEURONTIN) 300 MG capsule Take 300 mg by mouth  daily.     glipiZIDE (GLUCOTROL) 10 MG tablet TAKE 1 TABLET BY MOUTH TWICE A DAY FOR DIABETES 180 tablet 1   glucose blood (ONETOUCH VERIO) test strip USE AS INSTRUCTED TO TEST BLOOD SUGAR up to 3 times daily. 300 each 3   hydrALAZINE (APRESOLINE) 50 MG tablet Take 1 tablet (50 mg total) by mouth in the morning and at bedtime. 60 tablet 6   hydrOXYzine (ATARAX) 10 MG tablet Take 1 tablet (10 mg total) by mouth 2 (two) times daily as needed for itching. 30 tablet 0   Lancets (ONETOUCH ULTRASOFT) lancets Use as instructed to test blood sugar daily 100 each 5   levothyroxine (SYNTHROID) 175 MCG tablet Take 1 tablet (175 mcg total) by mouth daily. 60 tablet 3   minocycline (DYNACIN) 100 MG tablet Take 1 tablet (100 mg total) by mouth 2 (two) times daily. 60 tablet 11   omeprazole  (PRILOSEC) 40 MG capsule TAKE 1 CAPSULE BY MOUTH DAILY FOR REFLUX 90 capsule 3   rosuvastatin (CRESTOR) 10 MG tablet TAKE 1 TABLET BY MOUTH EVERY DAY FOR CHOLESTEROL 90 tablet 0   sertraline (ZOLOFT) 50 MG tablet TAKE 1 TABLET (50 MG TOTAL) BY MOUTH DAILY. FOR ANXIETY AND DEPRESSION. 90 tablet 3   spironolactone (ALDACTONE) 25 MG tablet Take 1 tablet (25 mg total) by mouth daily. 90 tablet 3   tamoxifen (NOLVADEX) 20 MG tablet Take 1 tablet (20 mg total) by mouth daily. 90 tablet 4   guaiFENesin-codeine (ROBITUSSIN AC) 100-10 MG/5ML syrup Take 5 mLs by mouth 3 (three) times daily as needed for cough. (Patient not taking: Reported on 07/29/2022) 75 mL 0   predniSONE (DELTASONE) 20 MG tablet Take 2 tablets by mouth once daily for 5 days. (Patient not taking: Reported on 07/29/2022) 10 tablet 0   [DISCONTINUED] prochlorperazine (COMPAZINE) 10 MG tablet TAKE 1 TABLET BY MOUTH EVERY 6 HOURS AS NEEDED FOR NAUSEA OR VOMITING 30 tablet 2   Current Facility-Administered Medications on File Prior to Visit  Medication Dose Route Frequency Provider Last Rate Last Admin   sodium chloride flush (NS) 0.9 % injection 10 mL  10 mL Intracatheter PRN Magrinat, Virgie Dad, MD   10 mL at 09/19/18 1551    BP (!) 148/84   Pulse 82   Temp 97.7 F (36.5 C) (Temporal)   Ht 5' 5" (1.651 m)   Wt 216 lb (98 kg)   LMP 03/31/2014 (LMP Unknown)   SpO2 98%   BMI 35.94 kg/m  Objective:   Physical Exam Cardiovascular:     Rate and Rhythm: Normal rate and regular rhythm.  Pulmonary:     Effort: Pulmonary effort is normal.     Breath sounds: Normal breath sounds.  Musculoskeletal:     Cervical back: Neck supple.     Right knee: No swelling or bony tenderness. Normal range of motion. No tenderness.     Left knee: No swelling or bony tenderness. Normal range of motion. No tenderness.     Comments: Pain with extension and with ambulation to both knees   Skin:    General: Skin is warm and dry.           Assessment  & Plan:   Problem List Items Addressed This Visit       Endocrine   Non-insulin dependent type 2 diabetes mellitus ([place]) - Primary    Improved with A1C today of 6.7!  Interestingly, she has not had insulin in 1 month.  We decided to  continue Glipizide 10 mg BID for now. Hold off on Basalgar for now.  She will continue to monitor glucose readings and notify if glucose readings are trending upward.  We discussed appropriate times to check glucose readings.  Urine microalbumin pending today.  Follow-up in 3 months.      Relevant Orders   Microalbumin/Creatinine Ratio, Urine   POCT glycosylated hemoglobin (Hb A1C) (Completed)     Other   Hyperlipidemia    Due for repeat lipid panel. Lipid panel pending today.      Relevant Orders   Lipid panel   Chronic pain of both knees    Symptoms representative of osteoarthritis rather than gout, especially since she has been on prednisone for recent gout attack.  X-rays of the knees pending today. Referral placed to physical therapy.      Relevant Orders   DG Knee 1-2 Views Left   DG Knee 1-2 Views Right   Ambulatory referral to Physical Therapy       [name], NP

## 2022-07-29 NOTE — Assessment & Plan Note (Signed)
Symptoms representative of osteoarthritis rather than gout, especially since she has been on prednisone for recent gout attack.  X-rays of the knees pending today. Referral placed to physical therapy.

## 2022-07-29 NOTE — Assessment & Plan Note (Signed)
Due for repeat lipid panel. Lipid panel pending today.

## 2022-07-29 NOTE — Patient Instructions (Addendum)
Stop by the lab and xray prior to leaving today. I will notify you of your results once received.   Remain off of your Basaglar Insulin.  You will be contacted regarding your referral to physical therapy.  Please let us know if you have not been contacted within two weeks.   Please schedule a follow up visit for 3 months for routine physical.  It was a pleasure to see you today!

## 2022-08-08 ENCOUNTER — Other Ambulatory Visit: Payer: Self-pay | Admitting: Primary Care

## 2022-08-08 DIAGNOSIS — E119 Type 2 diabetes mellitus without complications: Secondary | ICD-10-CM

## 2022-08-09 ENCOUNTER — Other Ambulatory Visit: Payer: Commercial Managed Care - HMO

## 2022-08-10 ENCOUNTER — Other Ambulatory Visit: Payer: Self-pay

## 2022-08-10 ENCOUNTER — Inpatient Hospital Stay: Payer: Commercial Managed Care - HMO | Attending: Hematology and Oncology | Admitting: Hematology and Oncology

## 2022-08-10 VITALS — BP 148/84 | HR 74 | Temp 97.9°F | Resp 16 | Ht 65.0 in | Wt 215.7 lb

## 2022-08-10 DIAGNOSIS — C50211 Malignant neoplasm of upper-inner quadrant of right female breast: Secondary | ICD-10-CM | POA: Diagnosis present

## 2022-08-10 DIAGNOSIS — Z170 Estrogen receptor positive status [ER+]: Secondary | ICD-10-CM | POA: Insufficient documentation

## 2022-08-10 DIAGNOSIS — C50311 Malignant neoplasm of lower-inner quadrant of right female breast: Secondary | ICD-10-CM | POA: Diagnosis not present

## 2022-08-10 DIAGNOSIS — Z79810 Long term (current) use of selective estrogen receptor modulators (SERMs): Secondary | ICD-10-CM | POA: Insufficient documentation

## 2022-08-10 NOTE — Progress Notes (Signed)
[name]  [phone] [phone]    ID: [name] DOB: May 15, 1966  MR#: [id number]  JYN#:[id number]  Patient Care Team: [name], NP as PCP - General (Internal Medicine) Michel Bickers, MD as PCP - Infectious Diseases (Infectious Diseases) Magrinat, Virgie Dad, MD (Inactive) as Consulting Physician (Oncology) Kyung Rudd, MD as Consulting Physician (Radiation Oncology) Erroll Luna, MD as Consulting Physician (General Surgery) Alexis Frock, MD as Consulting Physician (Urology) Bensimhon, Shaune Pascal, MD as Consulting Physician (Cardiology) Elmarie Shiley, MD as Consulting Physician (Nephrology) Renato Shin, MD (Inactive) as Consulting Physician (Endocrinology) Alda Berthold, DO as Consulting Physician (Neurology) OTHER MD:   CHIEF COMPLAINT: Triple positive breast cancer  CURRENT TREATMENT: tamoxifen   INTERVAL HISTORY: [name] returns today for follow-up of her triple positive breast cancer.    She continues on tamoxifen.  She has not flashes but she is learned to live with them.  She also complains of some depression but she doesn't really want to try anything new. She was started on zoloft, may have some room for uptitration but patient is reluctant to change the dose. She says she hasn't been able to work and doesn't think she can work given her ongoing health issues. She is hoping to claim disability. She denies any changes in breast but has severe HS so its hard for her to comment on changes.  Last mammogram Nov 2022 with no changes, she is almost due for another one.  Of note since her last visit, she underwent  MR abdomen, no concern for malignancy.   COVID 19 VACCINATION STATUS: [place] x2, most recently 01/2020   HISTORY OF CURRENT ILLNESS: From the original intake note:  [name] (pronounced "Huff") palpated a mass in the right breast for about 2 weeks before bringing it to medical attention. She  underwent bilateral diagnostic mammography with tomography and right breast ultrasonography at [facility] on 04/20/2018 showing: breast density category B. There was a highly suspicious mass located at 3 o'clock in the upper inner quadrant measuring 2.9 x 2.5 x 1.6 cm and 4 cm from the nipple. Ultrasound of the right axilla showed 5 lymph nodes with abnormal cortical thickening. The left axilla showed 3 lymph nodes with abnormal cortical thickening.  Accordingly on 04/26/2018 she proceeded to biopsy of the right breast area in question. The pathology from this procedure showed ([id number]): Invasive ductal carcinoma, grade II with calcifications. One right axillary lymph node and one left axillary lymph node were negative for carcinoma. Prognostic indicators significant for: estrogen receptor, 100% positive and progesterone receptor, 80% positive, both with strong staining intensity. Proliferation marker Ki67 at 70%. HER2 amplified with ratios HER2/CEP17 signals 4.52 and average HER2 copies per cell 10.85  The patient's subsequent history is as detailed below.   PAST MEDICAL HISTORY: Past Medical History:  Diagnosis Date   Allergy    Anemia    Normocytic   Antibiotic-induced yeast infection 09/28/2020   Anxiety    Asthma    Blood dyscrasia    Bronchitis 2005   Bursitis of left shoulder 09/06/2019   CKD (chronic kidney disease)    CLASS 1-EXOPHTHALMOS-THYROTOXIC 02/08/2007   COVID-19 virus infection 04/28/2022   Diabetes mellitus without complication ([place])    Family history of breast cancer    Family history of lung cancer    Family history of prostate cancer    Gastroenteritis 07/10/07   Genetic testing 07/25/2018   [employer] + RNA Insight was ordered.  Genes  Analyzed (43 total): APC*, ATM*, AXIN2, BARD1, BMPR1A, BRCA1*, BRCA2*, BRIP1*, CDH1*, CDK4, CDKN2A, CHEK2*, DICER1, GALNT12, HOXB13, MEN1, MLH1*, MRE11A, MSH2*, MSH3, MSH6*, MUTYH*, NBN, NF1*, NTHL1, PALB2*, PMS2*, POLD1, POLE,  PTEN*, RAD50, RAD51C*, RAD51D*, RET, SDHB, SDHD, SMAD4, SMARCA4, STK11 and TP53* (sequencing and deletion/duplication); EGFR (s   GERD 07/24/2006   GRAVE'S DISEASE 01/01/2008   History of hidradenitis suppurativa    History of kidney stones    History of thrush    HIV DISEASE 07/24/2006   dx March 05   Hyperlipidemia    HYPERTENSION 07/24/2006   Hyperthyroidism 08/2006   Grave's Disease -diffuse radiotracer uptake 08/25/06 Thyroid scan-Cold nodule to R lower lobe of thyrorid   Menometrorrhagia    hx of   Nephrolithiasis    Panniculitis 05/12/2020   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 Completed 2 yrs Prednisone after lung bx confirmation. No symptoms since then.   Suppurative hidradenitis    Thyroid cancer ([place])    THYROID NODULE, RIGHT 02/08/2007  -2018 thyroid nodules were precancerous but pathology was negative for thyroid cancer.    PAST SURGICAL HISTORY: Past Surgical History:  Procedure Laterality Date   APPLICATION OF WOUND VAC N/A 01/20/2021   Procedure: APPLICATION OF WOUND VAC;  Surgeon: Wallace Going, DO;  Location: [facility];  Service: Plastics;  Laterality: N/A;   BREAST EXCISIONAL BIOPSY Right 04/26/2018   right axilla negative   BREAST EXCISIONAL BIOPSY Left 04/26/2018   left axilla negative   BREAST LUMPECTOMY Right 10/03/2018   malignant   BREAST LUMPECTOMY WITH RADIOACTIVE SEED AND SENTINEL LYMPH NODE BIOPSY Right 10/03/2018   Procedure: RIGHT BREAST LUMPECTOMY WITH RADIOACTIVE SEED AND SENTINEL LYMPH NODE  MAPPING;  Surgeon: Erroll Luna, MD;  Location: [facility];  Service: General;  Laterality: Right;   BREAST SURGERY  1997   Breast Reduction    CYSTOSCOPY W/ URETERAL STENT REMOVAL  11/09/2012   Procedure: CYSTOSCOPY WITH STENT REMOVAL;  Surgeon: Alexis Frock, MD;  Location: [facility];  Service: Urology;  Laterality: Right;   CYSTOSCOPY WITH RETROGRADE PYELOGRAM, URETEROSCOPY AND STENT PLACEMENT  11/09/2012   Procedure: CYSTOSCOPY WITH RETROGRADE PYELOGRAM, URETEROSCOPY AND STENT PLACEMENT;  Surgeon: Alexis Frock, MD;  Location: [facility];  Service: Urology;  Laterality: Left;  LEFT URETEROSCOPY, STONE MANIPULATION, left STENT exchange    CYSTOSCOPY WITH STENT PLACEMENT  10/02/2012   Procedure: CYSTOSCOPY WITH STENT PLACEMENT;  Surgeon: Alexis Frock, MD;  Location: [facility];  Service: Urology;  Laterality: Left;   DEBRIDEMENT AND CLOSURE WOUND N/A 01/20/2021   Procedure: Excision of abdominal wound with closure;  Surgeon: Wallace Going, DO;  Location: [facility];  Service: Plastics;  Laterality: N/A;   DILATION AND CURETTAGE OF UTERUS  Feb 2004   s/p for 1st trimester nonviable pregnancy   EYE SURGERY     sty under eyelid   INCISE AND DRAIN ABCESS  Nov 03   s/p I &D for righ inframmary fold hidradenitis   INCISION AND DRAINAGE PERITONSILLAR ABSCESS  Mar 03   IR CV LINE  INJECTION  06/07/2018   IR IMAGING GUIDED PORT INSERTION  06/20/2018   IR REMOVAL TUN ACCESS W/ PORT W/O FL MOD SED  06/20/2018   IRRIGATION AND DEBRIDEMENT ABSCESS  01/31/2012   Procedure: IRRIGATION AND DEBRIDEMENT ABSCESS;  Surgeon: Shann Medal, MD;  Location: [facility];  Service: General;  Laterality: Right;  right breast and axilla    NEPHROLITHOTOMY  10/02/2012   Procedure: NEPHROLITHOTOMY PERCUTANEOUS;  Surgeon: Alexis Frock, MD;  [facility];  Service: Urology;  Laterality: Right;  First Stage Percutaneous Nephrolithotomy with Surgeon Access, Left Ureteral Stent     NEPHROLITHOTOMY  10/04/2012    Procedure: NEPHROLITHOTOMY PERCUTANEOUS SECOND LOOK;  Surgeon: Alexis Frock, MD;  [facility]: [facility];  Service: Urology;  Laterality: Right;      NEPHROLITHOTOMY  10/08/2012   Procedure: NEPHROLITHOTOMY PERCUTANEOUS;  Surgeon: Alexis Frock, MD;  [facility];  Service: Urology;  Laterality: Right;  THIRD STAGE, nephrostomy tube exchange x 2   NEPHROLITHOTOMY  10/11/2012   Procedure: NEPHROLITHOTOMY PERCUTANEOUS SECOND LOOK;  Surgeon: Alexis Frock, MD;  [facility];  Service: Urology;  Laterality: Right;  RIGHT 4 STAGE PERCUTANOUS NEPHROLITHOTOMY, right URETEROSCOPY WITH HOLMIUM LASER    PANNICULECTOMY N/A 12/21/2020   Procedure: PANNICULECTOMY;  Surgeon: Wallace Going, DO;  Location: [facility];  Service: Plastics;  Laterality: N/A;   PORT-A-CATH REMOVAL N/A 07/16/2020   Procedure: REMOVAL PORT-A-CATH;  Surgeon: Erroll Luna, MD;  Location: [facility];  Service: General;  Laterality: N/A;   PORTACATH PLACEMENT Left 05/17/2018   Procedure: INSERTION PORT-A-CATH;  Surgeon: Coralie Keens, MD;  Location: [facility];  Service: General;  Laterality: Left;   RADICAL NECK DISSECTION  04/12/2017   limited/notes 04/12/2017   RADICAL NECK DISSECTION N/A 04/12/2017   Procedure: RADICAL NECK DISSECTION;  Surgeon: Melida Quitter, MD;  Location: [facility];  Service: ENT;  Laterality: N/A;  limited neck dissection 2 hours total   REDUCTION MAMMAPLASTY Bilateral 1998   RIGHT/LEFT HEART CATH AND CORONARY ANGIOGRAPHY N/A 03/12/2020   Procedure: RIGHT/LEFT HEART CATH AND CORONARY ANGIOGRAPHY;  Surgeon: Jolaine Artist, MD;  Location: [facility];  Service: Cardiovascular;  Laterality: N/A;   [place]  1994   THYROIDECTOMY  04/12/2017   completion/notes 04/12/2017   THYROIDECTOMY N/A 04/12/2017   Procedure: THYROIDECTOMY;  Surgeon: Melida Quitter, MD;  Location: [facility];  Service: ENT;  Laterality: N/A;  Completion Thyroidectomy   TOTAL THYROIDECTOMY  2010  Thyroid  nodules removed- pathology at the ENT doctor 2018 were benign -Over active thyroid and thyroidectomy with benign pathology   FAMILY HISTORY Family History  Problem Relation Age of Onset   Hypertension Mother    Cancer Mother        laryngeal   Heart disease Mother        stent   Hypertension Father    Lung cancer Father 75       hx smoking   Heart disease Other    Hypertension Other    Stroke Other        Grandparent   Kidney disease Other        Grandparent   Diabetes Other        FH of Diabetes   Hypertension Sister    Cancer Maternal Uncle        Lung CA   Hypertension Brother    Hypertension Sister    Breast cancer Maternal Aunt 82   Breast cancer Paternal Aunt 28   Prostate cancer  Paternal Uncle    Breast cancer Maternal Aunt        dx 60+   Breast cancer Paternal Aunt        dx 77's   Breast cancer Paternal Aunt        dx 72's   Prostate cancer Paternal Uncle    Lung cancer Paternal Uncle    Breast cancer Cousin 73   Breast cancer Cousin        dx <50   Breast cancer Cousin        dx <50   Breast cancer Cousin        dx <50   Colon cancer Neg Hx    Esophageal cancer Neg Hx    Rectal cancer Neg Hx    Stomach cancer Neg Hx   The patient's mother is alive at age 4. The patient's father died at 24 from lung cancer (heavy smoker). She does not know much about her father. The patient has 1 brother and 2 sisters. The patient's mother had a history of laryngeal cancer. There was a maternal cousin with breast cancer diagnosed at age 51. There were 2 maternal aunts with breast cancer, the youngest being diagnosed in the 65's. There was a maternal great aunt with breast cancer. There were 2 paternal aunts with breast cancer.    GYNECOLOGIC HISTORY:  Patient's last menstrual period was 03/31/2014 (lmp unknown). Menarche: 56 years old Age at first live birth: 56 years old She is GX P1.  Her LMP was in 2017. She did not use HRT.    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 She is single. At home is the patient's daughter, Gilda Crease, age 57, is a Runner, broadcasting/film/video and also works in the summers and Clinical biochemist.  The patient attends [employer].   ADVANCED DIRECTIVES: Not in place; at the 05/10/2018 visit the patient was given the appropriate documents to complete and notarized at her discretion   HEALTH MAINTENANCE: Social History   Tobacco Use   Smoking status: Former    Packs/day: 0.50    Years: 15.00    Total pack years: 7.50    Types: Cigarettes    Start date: 04/12/2017    Quit date: 2019    Years since quitting: 4.8   Smokeless tobacco: Never  Vaping Use   Vaping Use: Never used  Substance Use Topics   Alcohol use: Yes    Alcohol/week: 0.0 standard drinks of alcohol    Comment: social   Drug use: No     Colonoscopy: no  PAP: 09/2020, negative  Bone density: remote   Allergies  Allergen Reactions   Genvoya [Elviteg-Cobic-Emtricit-Tenofaf] Hives   Lisinopril Cough   Tape Rash    Rash  Okay with tegaderm and paper tape   Tegaderm Ag Mesh [Silver] Itching    Current Outpatient Medications  Medication Sig Dispense Refill   acetaminophen (TYLENOL) 500 MG tablet Take 1,000 mg by mouth every 6 (six) hours as needed for mild pain.     albuterol (VENTOLIN HFA) 108 (90 Base) MCG/ACT inhaler Inhale 2 puffs into the lungs every 6 (six) hours as needed for wheezing or shortness of breath. 8 g 2   allopurinol (ZYLOPRIM) 100 MG tablet TAKE 1 TABLET BY MOUTH EVERY DAY FOR GOUT PREVENTION 90 tablet 1   Blood Glucose Monitoring Suppl (FREESTYLE LITE) w/Device KIT Inject 1 each into the skin daily. 1 kit 0  candesartan (ATACAND) 32 MG tablet TAKE 1 TABLET BY MOUTH DAILY. 30 tablet 5   carvedilol (COREG) 25 MG tablet Take 1 tablet (25 mg total) by mouth 2 (two) times daily. 180 tablet 3   cetirizine (ZYRTEC) 10 MG tablet Take 1 tablet by mouth at bedtime for allergies.  90 tablet 3   dolutegravir ([name]) 50 MG tablet Take 1 tablet (50 mg total) by mouth daily. 30 tablet 11   emtricitabine-tenofovir AF (DESCOVY) 200-25 MG tablet Take 1 tablet by mouth daily. 30 tablet 11   FLOVENT HFA 110 MCG/ACT inhaler TAKE 1 PUFF BY MOUTH TWICE A DAY (Patient taking differently: Inhale 1 puff into the lungs 2 (two) times daily as needed (wheezing/shortness of breath).) 36 Inhaler 1   fluticasone (FLONASE) 50 MCG/ACT nasal spray Place 2 sprays into both nostrils daily. 16 g 6   gabapentin (NEURONTIN) 300 MG capsule Take 300 mg by mouth daily.     glipiZIDE (GLUCOTROL) 10 MG tablet TAKE 1 TABLET BY MOUTH TWICE A DAY FOR DIABETES 180 tablet 1   glucose blood (ONETOUCH VERIO) test strip USE AS INSTRUCTED TO TEST BLOOD SUGAR up to 3 times daily. 300 each 3   guaiFENesin-codeine (ROBITUSSIN AC) 100-10 MG/5ML syrup Take 5 mLs by mouth 3 (three) times daily as needed for cough. (Patient not taking: Reported on 07/29/2022) 75 mL 0   hydrALAZINE (APRESOLINE) 50 MG tablet Take 1 tablet (50 mg total) by mouth in the morning and at bedtime. 60 tablet 6   hydrOXYzine (ATARAX) 10 MG tablet Take 1 tablet (10 mg total) by mouth 2 (two) times daily as needed for itching. 30 tablet 0   Lancets (ONETOUCH ULTRASOFT) lancets Use as instructed to test blood sugar daily 100 each 5   levothyroxine (SYNTHROID) 175 MCG tablet Take 1 tablet (175 mcg total) by mouth daily. 60 tablet 3   minocycline (DYNACIN) 100 MG tablet Take 1 tablet (100 mg total) by mouth 2 (two) times daily. 60 tablet 11   omeprazole (PRILOSEC) 40 MG capsule TAKE 1 CAPSULE BY MOUTH DAILY FOR REFLUX 90 capsule 3   predniSONE (DELTASONE) 20 MG tablet Take 2 tablets by mouth once daily for 5 days. (Patient not taking: Reported on 07/29/2022) 10 tablet 0   rosuvastatin (CRESTOR) 10 MG tablet TAKE 1 TABLET BY MOUTH EVERY DAY FOR CHOLESTEROL 90 tablet 0   sertraline (ZOLOFT) 50 MG tablet TAKE 1 TABLET (50 MG TOTAL) BY MOUTH DAILY. FOR  ANXIETY AND DEPRESSION. 90 tablet 3   spironolactone (ALDACTONE) 25 MG tablet Take 1 tablet (25 mg total) by mouth daily. 90 tablet 3   tamoxifen (NOLVADEX) 20 MG tablet Take 1 tablet (20 mg total) by mouth daily. 90 tablet 4   No current facility-administered medications for this visit.   Facility-Administered Medications Ordered in Other Visits  Medication Dose Route Frequency Provider Last Rate Last Admin   sodium chloride flush (NS) 0.9 % injection 10 mL  10 mL Intracatheter PRN Magrinat, Virgie Dad, MD   10 mL at 09/19/18 1551    OBJECTIVE: African-American woman   Vitals:   08/10/22 1137  BP: (!) 148/84  Pulse: 74  Resp: 16  Temp: 97.9 F (36.6 C)  SpO2: 95%   Wt Readings from Last 3 Encounters:  08/10/22 215 lb 11.2 oz (97.8 kg)  07/29/22 216 lb (98 kg)  06/27/22 221 lb (100.2 kg)   Body mass index is 35.89 kg/m.    ECOG FS:1 - Symptomatic but completely ambulatory  Physical Exam Constitutional:      Appearance: Normal appearance.  Chest:     Comments: Bilateral breasts inspected. Rt breast post lumpectomy. Severe HS changes noted causing some dysfiguring and lumpiness in the inferior portion of the breast. No overt masses. Left breast normal to inspection and palpation. No regional adenopathy. Musculoskeletal:     Cervical back: Normal range of motion and neck supple. No rigidity.  Lymphadenopathy:     Cervical: No cervical adenopathy.  Neurological:     Mental Status: She is alert.       LAB RESULTS:  CMP     Component Value Date/Time   NA 138 10/27/2021 1145   NA 143 06/26/2020 0000   K 3.9 10/27/2021 1145   CL 103 10/27/2021 1145   CO2 29 10/27/2021 1145   GLUCOSE 213 (H) 10/27/2021 1145   BUN 26 (H) 10/27/2021 1145   BUN 25 (A) 06/26/2020 0000   CREATININE 2.48 (H) 10/27/2021 1145   CREATININE 2.72 (H) 07/27/2021 1110   CREATININE 2.74 (H) 03/17/2021 1050   CALCIUM 9.0 10/27/2021 1145   PROT 7.3 10/24/2021 0408   ALBUMIN 3.2 (L) 10/24/2021  0408   AST 26 10/24/2021 0408   AST 28 07/27/2021 1110   ALT 18 10/24/2021 0408   ALT 19 07/27/2021 1110   ALKPHOS 63 10/24/2021 0408   BILITOT 0.4 10/24/2021 0408   BILITOT 0.3 07/27/2021 1110   GFRNONAA 24 (L) 10/25/2021 0637   GFRNONAA 20 (L) 07/27/2021 1110   GFRAA 39 (L) 07/14/2020 1053   GFRAA 33 (L) 08/12/2019 1447    No results found for: "TOTALPROTELP", "ALBUMINELP", "A1GS", "A2GS", "BETS", "BETA2SER", "GAMS", "MSPIKE", "SPEI"  No results found for: "KPAFRELGTCHN", "LAMBDASER", "KAPLAMBRATIO"  Lab Results  Component Value Date   WBC 5.7 10/24/2021   NEUTROABS 4.3 10/23/2021   HGB 10.9 (L) 10/24/2021   HCT 33.9 (L) 10/24/2021   MCV 96.6 10/24/2021   PLT 227 10/24/2021   No results found for: "LABCA2"  No components found for: "RKYHCW237"  No results for input(s): "INR" in the last 168 hours.  No results found for: "LABCA2"  No results found for: "SEG315"  No results found for: "CAN125"  No results found for: "CAN153"  No results found for: "CA2729"  No components found for: "HGQUANT"  No results found for: "CEA1", "CEA" / No results found for: "CEA1", "CEA"   No results found for: "AFPTUMOR"  No results found for: "CHROMOGRNA"  No results found for: "HGBA", "HGBA2QUANT", "HGBFQUANT", "HGBSQUAN" (Hemoglobinopathy evaluation)   No results found for: "LDH"  Lab Results  Component Value Date   IRON 26 (L) 02/08/2007   TIBC 352 06/26/2020   IRONPCTSAT 15 06/26/2020   (Iron and TIBC)  Lab Results  Component Value Date   FERRITIN 141 06/26/2020    Urinalysis    Component Value Date/Time   COLORURINE AMBER (A) 10/22/2021 1636   APPEARANCEUR CLOUDY (A) 10/22/2021 1636   LABSPEC 1.018 10/22/2021 1636   PHURINE 5.0 10/22/2021 1636   GLUCOSEU 50 (A) 10/22/2021 1636   GLUCOSEU NEG mg/dL 01/23/2008 1418   HGBUR LARGE (A) 10/22/2021 1636   BILIRUBINUR NEGATIVE 10/22/2021 1636   BILIRUBINUR negative 04/30/2021 1119   KETONESUR 5 (A) 10/22/2021  1636   PROTEINUR >=300 (A) 10/22/2021 1636   UROBILINOGEN 0.2 04/30/2021 1119   UROBILINOGEN 0.2 10/21/2014 2347   NITRITE NEGATIVE 10/22/2021 1636   LEUKOCYTESUR LARGE (A) 10/22/2021 1636    STUDIES: DG Knee 1-2 Views Left  Result Date: 07/31/2022  CLINICAL DATA:  Acute on chronic knee pain EXAM: LEFT KNEE - 1-2 VIEW; RIGHT KNEE - 1-2 VIEW COMPARISON:  None Available. FINDINGS: Right knee: No evidence of fracture, dislocation, or joint effusion. No evidence of arthropathy or other focal bone abnormality. Soft tissues are unremarkable. Left knee: No evidence of fracture, dislocation, or joint effusion. No evidence of arthropathy or other focal bone abnormality. Soft tissues are unremarkable. IMPRESSION: Negative. Electronically Signed   By: Ronney Asters M.D.   On: 07/31/2022 19:27   DG Knee 1-2 Views Right  Result Date: 07/31/2022 CLINICAL DATA:  Acute on chronic knee pain EXAM: LEFT KNEE - 1-2 VIEW; RIGHT KNEE - 1-2 VIEW COMPARISON:  None Available. FINDINGS: Right knee: No evidence of fracture, dislocation, or joint effusion. No evidence of arthropathy or other focal bone abnormality. Soft tissues are unremarkable. Left knee: No evidence of fracture, dislocation, or joint effusion. No evidence of arthropathy or other focal bone abnormality. Soft tissues are unremarkable. IMPRESSION: Negative. Electronically Signed   By: Ronney Asters M.D.   On: 07/31/2022 19:27     ELIGIBLE FOR AVAILABLE RESEARCH PROTOCOL: declined DCP study  ASSESSMENT: 56 y.o. [place], [place] woman status post right breast upper inner quadrant biopsy 04/26/2018 for a clinical T2 N0, stage IB invasive ductal carcinoma, grade 2, ER/PR positive, HER-2 positive with an MIB-1 of 70%.  (1) genetics testing 07/03/2018: no pathogenic mutations. Genes Analyzed (43 total): APC*, ATM*, AXIN2, BARD1, BMPR1A, BRCA1*, BRCA2*, BRIP1*, CDH1*, CDK4, CDKN2A, CHEK2*, DICER1,GALNT12, HOXB13, MEN1, MLH1*, MRE11A, MSH2*, MSH3, MSH6*, MUTYH*,  NBN, NF1*, NTHL1, PALB2*, PMS2*, POLD1, POLE,PTEN*, RAD50, RAD51C*, RAD51D*, RET, SDHB, SDHD, SMAD4, SMARCA4, STK11 and TP53* (sequencing and deletion/duplication); EGFR (sequencing only); EPCAM and GREM1 (deletion/duplication only). DNA and RNA analyses performed for * genes.  (2) neoadjuvant chemotherapy consisting of carboplatin, docetaxel, trastuzumab and pertuzumab given every 21 days x 6, starting 05/31/2018, completed 09/14/2018  (a) Pertuzumab discontinued starting with cycle 2 because of diarrhea problems  (b) Taxotere stopped after 4 cycles due peripheral neuropathy and substituted for last two cycles with gemcitabine  (3)  definitive surgery on 10/03/2018 showed residual ypT1c ypN0 invasive ductal carcinoma, grade 2, with negative margins; a total of 5 sentinel lymph nodes removed; repeat prognostic panel again triple positive  (4) T-DM1 given adjuvantly due to residual disease: started 11/13/2018, completed 11/04/2019.  (a) echo on 05/16/2018 shows an EF of 60-65%.  (b) echo on 08/27/18 shows an EF 55-60%  (c) echo on 12/03/2018 shows an EF of 60-65%  (d) echocardiogram on 03/06/2019 showed an ejection fraction of 55% as read by Dr. Haroldine Laws.  (e) echocardiogram 06/20/2019 shows an ejection fraction in the 55-60% range  (f) echo 02/20/2020 shows an ejection fraction in the 60-65% range.  (5) adjuvant radiation completed 01/07/2019 - 02/21/2019 (delayed due to wound healing issues)   Site/dose:   The patient initially received a dose of 50.4 Gy in 28 fractions to the right breast using whole-breast tangent fields. This was delivered using a 3-D conformal technique. The patient then received a boost to the seroma. This delivered an additional 10 Gy in 5 fractions using 12e electrons with a special teletherapy technique. The total dose was 60.4 Gy.  (6) tamoxifen started 02/28/2019  (7) hidradenitis/boils: on doxycycline as needed   PLAN:  She is tolerating tamoxifen generally well  and the plan is to continue that a minimum of 5 years. She denies any complaints except for hot flashes and mood changes, cannot comment if this is related  to tamoxifen.  She was recently started on Zoloft by her primary care provider, we have discussed about up titration of Zoloft however patient is reluctant to make any changes since she does not want to be dependent on any medication.  She will continue tamoxifen through May 2025.  Her last mammogram in November was without any concerns for malignancy.  Physical examination today also without any overt concerns however her breast exam is challenging given the disfiguring scars from hidradenitis as well as lumpiness in the inferior portion of the breast.  No regional adenopathy palpated on exam today.  We have discussed about repeating mammogram in November, this has been ordered. She was strongly encouraged to continue self breast exam and report any changes to [place].  Rest of the pertinent 10 point ROS reviewed and negative.  Thank you for consulting [place] the care of this patient.  Please not hesitate to contact us with any additional questions or concerns.  Total time spent: 30 minutes  *Total Encounter Time as defined by the Centers for Medicare and Medicaid Services includes, in addition to the face-to-face time of a patient visit (documented in the note above) non-face-to-face time: obtaining and reviewing outside history, ordering and reviewing medications, tests or procedures, care coordination (communications with other health care professionals or caregivers) and documentation in the medical record.

## 2022-08-11 ENCOUNTER — Other Ambulatory Visit: Payer: Self-pay

## 2022-08-11 ENCOUNTER — Encounter: Payer: Self-pay | Admitting: Physical Therapy

## 2022-08-11 ENCOUNTER — Ambulatory Visit (INDEPENDENT_AMBULATORY_CARE_PROVIDER_SITE_OTHER): Payer: Commercial Managed Care - HMO | Admitting: Physical Therapy

## 2022-08-11 DIAGNOSIS — M25562 Pain in left knee: Secondary | ICD-10-CM | POA: Diagnosis not present

## 2022-08-11 DIAGNOSIS — M6281 Muscle weakness (generalized): Secondary | ICD-10-CM

## 2022-08-11 DIAGNOSIS — M5459 Other low back pain: Secondary | ICD-10-CM

## 2022-08-11 DIAGNOSIS — M25561 Pain in right knee: Secondary | ICD-10-CM

## 2022-08-11 DIAGNOSIS — R262 Difficulty in walking, not elsewhere classified: Secondary | ICD-10-CM | POA: Diagnosis not present

## 2022-08-11 DIAGNOSIS — G8929 Other chronic pain: Secondary | ICD-10-CM

## 2022-08-11 NOTE — Therapy (Signed)
OUTPATIENT PHYSICAL THERAPY LOWER EXTREMITY EVALUATION   Patient Name: [name] MRN: [id number] DOB:04/08/1966, 56 y.o., female Today's Date: 08/11/2022   PT End of Session - 08/11/22 1323     Visit Number 1    Number of Visits 12    Date for PT Re-Evaluation 10/06/22    Authorization Type [employer]    PT Start Time 8    PT Stop Time 1340    PT Time Calculation (min) 40 min    Activity Tolerance Patient tolerated treatment well             Past Medical History:  Diagnosis Date   Allergy    Anemia    Normocytic   Antibiotic-induced yeast infection 09/28/2020   Anxiety    Asthma    Blood dyscrasia    Bronchitis 2005   Bursitis of left shoulder 09/06/2019   CKD (chronic kidney disease)    CLASS 1-EXOPHTHALMOS-THYROTOXIC 02/08/2007   COVID-19 virus infection 04/28/2022   Diabetes mellitus without complication ([place])    Family history of breast cancer    Family history of lung cancer    Family history of prostate cancer    Gastroenteritis 07/10/07   Genetic testing 07/25/2018   [employer] + RNA [employer]ight was ordered.  Genes Analyzed (43 total): APC*, ATM*, AXIN2, BARD1, BMPR1A, BRCA1*, BRCA2*, BRIP1*, CDH1*, CDK4, CDKN2A, CHEK2*, DICER1, GALNT12, HOXB13, MEN1, MLH1*, MRE11A, MSH2*, MSH3, MSH6*, MUTYH*, NBN, NF1*, NTHL1, PALB2*, PMS2*, POLD1, POLE, PTEN*, RAD50, RAD51C*, RAD51D*, RET, SDHB, SDHD, SMAD4, SMARCA4, STK11 and TP53* (sequencing and deletion/duplication); EGFR (s   GERD 07/24/2006   GRAVE'S DISEASE 01/01/2008   History of hidradenitis suppurativa    History of kidney stones    History of thrush    HIV DISEASE 07/24/2006   dx March 05   Hyperlipidemia    HYPERTENSION 07/24/2006   Hyperthyroidism 08/2006   Grave's Disease -diffuse radiotracer uptake 08/25/06 Thyroid scan-Cold nodule to R lower lobe of thyrorid   Menometrorrhagia    hx of   Nephrolithiasis    Panniculitis 05/12/2020   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 Completed 2 yrs Prednisone after lung bx confirmation. No symptoms since then.   Suppurative hidradenitis    Thyroid cancer ([place])    THYROID NODULE, RIGHT 02/08/2007   Past Surgical History:  Procedure Laterality Date   APPLICATION OF WOUND VAC N/A 01/20/2021   Procedure: APPLICATION OF WOUND VAC;  Surgeon: Wallace Going, DO;  Location: [facility];  Service: Plastics;  Laterality: N/A;   BREAST EXCISIONAL BIOPSY Right 04/26/2018   right axilla negative   BREAST EXCISIONAL BIOPSY Left 04/26/2018   left axilla negative   BREAST LUMPECTOMY Right 10/03/2018   malignant   BREAST LUMPECTOMY WITH RADIOACTIVE SEED AND SENTINEL LYMPH NODE BIOPSY Right 10/03/2018   Procedure: RIGHT BREAST LUMPECTOMY WITH RADIOACTIVE SEED AND SENTINEL LYMPH NODE MAPPING;  Surgeon: Erroll Luna, MD;  Location: [facility];  Service: General;  Laterality: Right;   BREAST SURGERY  1997   Breast Reduction    CYSTOSCOPY W/ URETERAL  STENT REMOVAL  11/09/2012   Procedure: CYSTOSCOPY WITH STENT REMOVAL;  Surgeon: Alexis Frock, MD;  Location: [facility];  Service: Urology;  Laterality: Right;   CYSTOSCOPY WITH RETROGRADE PYELOGRAM, URETEROSCOPY AND STENT PLACEMENT  11/09/2012   Procedure: CYSTOSCOPY WITH RETROGRADE PYELOGRAM, URETEROSCOPY AND STENT PLACEMENT;  Surgeon: Alexis Frock, MD;  Location: [facility];  Service: Urology;  Laterality: Left;  LEFT URETEROSCOPY, STONE MANIPULATION, left STENT exchange    CYSTOSCOPY WITH STENT PLACEMENT  10/02/2012   Procedure: CYSTOSCOPY WITH STENT PLACEMENT;  Surgeon:  Alexis Frock, MD;  Location: [facility];  Service: Urology;  Laterality: Left;   DEBRIDEMENT AND CLOSURE WOUND N/A 01/20/2021   Procedure: Excision of abdominal wound with closure;  Surgeon: Wallace Going, DO;  Location: [facility];  Service: Plastics;  Laterality: N/A;   DILATION AND CURETTAGE OF UTERUS  Feb 2004   s/p for 1st trimester nonviable pregnancy   EYE SURGERY     sty under eyelid   INCISE AND DRAIN ABCESS  Nov 03   s/p I &D for righ inframmary fold hidradenitis   INCISION AND DRAINAGE PERITONSILLAR ABSCESS  Mar 03   IR CV LINE INJECTION  06/07/2018   IR IMAGING GUIDED PORT INSERTION  06/20/2018   IR REMOVAL TUN ACCESS W/ PORT W/O FL MOD SED  06/20/2018   IRRIGATION AND DEBRIDEMENT ABSCESS  01/31/2012   Procedure: IRRIGATION AND DEBRIDEMENT ABSCESS;  Surgeon: Shann Medal, MD;  Location[facility];  Service: General;  Laterality: Right;  right breast and axilla    NEPHROLITHOTOMY  10/02/2012   Procedure: NEPHROLITHOTOMY PERCUTANEOUS;  Surgeon: Alexis Frock, MD;  [facility];  Service: Urology;  Laterality: Right;  First Stage Percutaneous Nephrolithotomy with Surgeon Access, Left Ureteral Stent     NEPHROLITHOTOMY  10/04/2012   Procedure: NEPHROLITHOTOMY PERCUTANEOUS SECOND LOOK;  Surgeon: Alexis Frock, MD;  [facility];  Service: Urology;  Laterality: Right;      NEPHROLITHOTOMY  10/08/2012   Procedure: NEPHROLITHOTOMY PERCUTANEOUS;  Surgeon: Alexis Frock, MD;  [facility];  Service: Urology;  Laterality: Right;  THIRD STAGE, nephrostomy tube exchange x 2   NEPHROLITHOTOMY  10/11/2012   Procedure: NEPHROLITHOTOMY PERCUTANEOUS SECOND LOOK;  Surgeon: Alexis Frock, MD;  [facility];  Service: Urology;  Laterality: Right;  RIGHT 4 STAGE PERCUTANOUS NEPHROLITHOTOMY, right URETEROSCOPY WITH HOLMIUM LASER    PANNICULECTOMY N/A 12/21/2020   Procedure: PANNICULECTOMY;  Surgeon: Wallace Going, DO;  Location: [facility];  Service: Plastics;   Laterality: N/A;   PORT-A-CATH REMOVAL N/A 07/16/2020   Procedure: REMOVAL PORT-A-CATH;  Surgeon: Erroll Luna, MD;  Location: [facility];  Service: General;  Laterality: N/A;   PORTACATH PLACEMENT Left 05/17/2018   Procedure: INSERTION PORT-A-CATH;  Surgeon: Coralie Keens, MD;  Location: [facility];  Service: General;  Laterality: Left;   RADICAL NECK DISSECTION  04/12/2017   limited/notes 04/12/2017   RADICAL NECK DISSECTION N/A 04/12/2017   Procedure: RADICAL NECK DISSECTION;  Surgeon: Melida Quitter, MD;  Location: [facility];  Service: ENT;  Laterality: N/A;  limited neck dissection 2 hours total   REDUCTION MAMMAPLASTY Bilateral 1998   RIGHT/LEFT HEART CATH AND CORONARY ANGIOGRAPHY N/A 03/12/2020   Procedure: RIGHT/LEFT HEART CATH AND CORONARY ANGIOGRAPHY;  Surgeon: Jolaine Artist, MD;  Location: [facility];  Service: Cardiovascular;  Laterality: N/A;   [place]  04/12/2017   completion/notes 04/12/2017   THYROIDECTOMY N/A 04/12/2017   Procedure: THYROIDECTOMY;  Surgeon: Melida Quitter, MD;  Location: [facility];  Service: ENT;  Laterality: N/A;  Completion Thyroidectomy   TOTAL THYROIDECTOMY  2010   Patient Active Problem List   Diagnosis Date Noted   Chronic pain of both knees 07/29/2022   Pruritus 11/26/2021   Chronic illness 10/27/2021   Acute renal failure superimposed on stage 4 chronic kidney disease ([place]) 10/24/2021   Acute pancreatitis 10/23/2021   Lymphadenopathy 07/01/2021   Paresthesias 06/30/2021   Chronic pain of right hand 06/30/2021   Chronic midline low back pain with right-sided sciatica 06/30/2021   COVID-19 long hauler 05/11/2021   Voice hoarseness 12/15/2020   Recurrent falls 11/05/2020   Impingement syndrome of left shoulder region 05/27/2020   Inclusion cyst 05/01/2020   Sebaceous cyst 04/21/2020   Postoperative breast asymmetry 02/28/2020   Cognitive dysfunction 02/21/2020   Chronic gout 01/27/2020    Hematuria 12/10/2019   Neuropathy due to chemotherapeutic drug ([place]) 12/10/2019   Pain in joint of left shoulder 09/05/2019   Dysphagia 07/23/2019   CKD stage 3 due to type 2 diabetes mellitus ([place]) 07/12/2019   Normocytic anemia 07/12/2019   Preventative health care 09/10/2018   Port-A-Cath in place 07/12/2018   Family history of breast cancer    Family history of prostate cancer    Family history of lung cancer    Malignant neoplasm of lower-inner quadrant of right breast of female, estrogen receptor positive ([place]) 05/10/2018   Thyroid cancer ([place])    Hyperlipidemia 04/26/2017   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[place]) 04/12/2017   GAD (generalized anxiety disorder) 04/11/2017   Mass of left side of neck 11/24/2016   Laryngopharyngeal reflux (LPR) 11/24/2016   Upper airway cough syndrome 11/11/2016   Obesity (BMI 30-39.9) 10/14/2016   Allergic rhinitis 02/18/2016   Non-insulin dependent type 2 diabetes mellitus ([place]) 06/23/2015   Renal calculi 10/29/2014   Recurrent boils 06/11/2014   Rash 02/16/2012   Hidradenitis suppurativa 01/30/2012   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   Sarcoidosis 02/08/2007   Cigarette smoker 02/08/2007   HIV disease ([place]) 07/24/2006   Depression 07/24/2006   Essential hypertension 07/24/2006   Asthma 07/24/2006    PCP: [name], NP  REFERRING PROVIDER: [name], NP  REFERRING DIAG: M25.561,M25.562,G89.29 (ICD-10-CM) - Chronic pain of both knees  THERAPY DIAG:  Chronic pain of right knee  Chronic pain of left knee  Muscle weakness (generalized)  Difficulty in walking, not elsewhere classified  Other low back pain  Rationale for Evaluation and Treatment Rehabilitation  ONSET DATE: Chronic knee pain for 3 years  SUBJECTIVE:   SUBJECTIVE STATEMENT: 55 year old female with chronic, bilateral knee pain and some chronic back pain. She says her knees pop a lot and feels like her joints need to wake up in the  morning. She says pain since she took chemotherapy 3 years ago and then became sedentary.  PERTINENT HISTORY: Asthma, chronic stage 4 kidney disease, type 2 diabetes, Grave's disease, history of kidney stones, HIV, HLD, HTN, thyroid  PAIN:  Are you having pain? Yes: NPRS scale: 7/10 Pain location: both anterior knees and low back Pain description: dull Aggravating factors: stairs, sit to stands from chair or toilet, walking Relieving factors: haven't tried anything for it.   PRECAUTIONS: None  WEIGHT BEARING RESTRICTIONS: No  FALLS:  Has patient fallen in last 6 months? No  LIVING ENVIRONMENT:  Stairs: Yes has hand rails and feels like she has to pull herself up   OCCUPATION: none  PLOF: Independent  PATIENT GOALS: Improve pain   OBJECTIVE:   DIAGNOSTIC FINDINGS: Recent bilateral knee Xrays were negative   PATIENT SURVEYS:  Eval: FOTO 31% functional, goal is 48%  COGNITION: Overall cognitive status: Within functional limits for tasks assessed     SENSATION: WFL  EDEMA:    MUSCLE LENGTH: Hamstrings: tight bilat Thomas test:tight bilat  POSTURE:   PALPATION:   LOWER EXTREMITY ROM:  Active ROM Right eval Left eval  Hip flexion    Hip extension    Hip abduction    Hip adduction    Hip internal rotation    Hip external rotation    Knee flexion 120 125  Knee extension 0 0  Ankle dorsiflexion    Ankle plantarflexion    Ankle inversion    Ankle eversion     (Blank rows = not tested)  Lumbar AROM  Eval Flexion 50% Extension 25% Rotation 25% bilat  LOWER EXTREMITY MMT:  MMT tested in sitting Right eval Left eval  Hip flexion 3 3  Hip extension    Hip abduction 4 4  Hip adduction    Hip internal rotation    Hip external rotation    Knee flexion 4 4  Knee extension 4 4  Ankle dorsiflexion    Ankle plantarflexion    Ankle inversion    Ankle eversion     (Blank rows = not tested)  LOWER EXTREMITY SPECIAL TESTS:  + SLR test  bilat  FUNCTIONAL TESTS:  Eval: 5 times sit to stand: 25.16 must use UE support to push up  GAIT: Comments: independent Ambulator, limited by pain at times with slower velocity   TODAY'S TREATMENT:  Eval HEP creation and review, see below for details Moist heat to low back and knees   PATIENT EDUCATION: Education details: HEP, PT plan of care, use of heat or ice Person educated: Patient Education method: Explanation, Demonstration, Verbal cues, and Handouts Education comprehension: verbalized understanding and needs further education   HOME EXERCISE PROGRAM: Access Code: [email or web address] URL: [email or web address] Date: 08/11/2022 Prepared by: Elsie Ra  Exercises - Seated Knee Flexion AAROM  - 2 x daily - 6 x weekly - 2 sets - 10 reps - 5 hold - Seated Knee Extension with Resistance  - 2 x daily - 6 x weekly - 2 sets - 10 reps - Sit to Stand with Armchair  - 2 x daily - 6 x weekly - 1-2 sets - 5 reps - Seated Lumbar Flexion Stretch  - 2 x daily - 6 x weekly - 1 sets - 10 reps - 10 hold - Marching with Resistance  - 1 x daily - 6 x weekly - 1-2 sets - 10 reps - Standing Lumbar Extension  - 2 x daily - 6 x weekly - 1-2 sets - 10 reps - 5 hold  ASSESSMENT:  CLINICAL IMPRESSION: Patient referred to PT for chronic, bilateral knee pain. She weakness and difficulty with functional tasks such as sit to stands, stairs, ambulation. She also has chronic back pain so I showed her stretches to do for this as well.  Patient will benefit from skilled PT to address below impairments, limitations and improve overall function.  OBJECTIVE IMPAIRMENTS: decreased activity tolerance, difficulty walking, decreased balance, decreased endurance, decreased mobility, decreased ROM, decreased strength, impaired flexibility, impaired UE/LE use, postural dysfunction, and pain.  ACTIVITY LIMITATIONS: bending, lifting, carry, locomotion, cleaning, community activity, driving, and or  occupation  PERSONAL FACTORS: Asthma, chronic stage 4 kidney disease, type 2 diabetes, Grave's  disease, history of kidney stones, HIV, HLD, HTN, thyroid  are also affecting patient's functional outcome.  REHAB POTENTIAL: Good  CLINICAL DECISION MAKING: Stable/uncomplicated  EVALUATION COMPLEXITY: Low    GOALS: Short term PT Goals Target date: 09/08/2022 Pt will be I and compliant with HEP. Baseline:  Goal status: New Pt will decrease pain by 25% overall (less than  Baseline: Goal status: New  Long term PT goals Target date: 10/06/2022 Pt will improve  hip/knee strength to at least 4+/5 MMT to improve functional strength Baseline: Goal status: New Pt will improve FOTO to at least 48% functional to show improved function Baseline: Goal status: New Pt will reduce pain by overall 50% (less than 4/10) overall with usual activity including sit to stand, stairs, and ambulation Baseline:8 Goal status: New  PLAN: PT FREQUENCY: 1-2 times per week   PT DURATION: 8 weeks  PLANNED INTERVENTIONS (unless contraindicated): aquatic PT, Canalith repositioning, cryotherapy, Electrical stimulation, Iontophoresis with 4 mg/ml dexamethasome, Moist heat, traction, Ultrasound, gait training, Therapeutic exercise, balance training, neuromuscular re-education, patient/family education, prosthetic training, manual techniques, passive ROM, dry needling, taping, vasopnuematic device, vestibular, spinal manipulations, joint manipulations  PLAN FOR NEXT SESSION: review HEP, leg strength focus   Debbe Odea, PT,DPT 08/11/2022, 1:25 PM

## 2022-08-17 ENCOUNTER — Encounter: Payer: Self-pay | Admitting: Physical Therapy

## 2022-08-17 ENCOUNTER — Ambulatory Visit: Payer: Commercial Managed Care - HMO | Admitting: Physical Therapy

## 2022-08-17 DIAGNOSIS — M5459 Other low back pain: Secondary | ICD-10-CM

## 2022-08-17 DIAGNOSIS — M6281 Muscle weakness (generalized): Secondary | ICD-10-CM

## 2022-08-17 DIAGNOSIS — M25562 Pain in left knee: Secondary | ICD-10-CM

## 2022-08-17 DIAGNOSIS — G8929 Other chronic pain: Secondary | ICD-10-CM

## 2022-08-17 DIAGNOSIS — M25561 Pain in right knee: Secondary | ICD-10-CM | POA: Diagnosis not present

## 2022-08-17 DIAGNOSIS — R262 Difficulty in walking, not elsewhere classified: Secondary | ICD-10-CM

## 2022-08-17 NOTE — Therapy (Signed)
OUTPATIENT PHYSICAL THERAPY LOWER EXTREMITY TREATMENT   Patient Name: [name] MRN: [id number] DOB:[birth year], 55 y.o., female Today's Date: 08/17/2022   PT End of Session - 08/17/22 1157     Visit Number 2    Number of Visits 12    Date for PT Re-Evaluation 10/06/22    Authorization Type [employer]    PT Start Time 1147    PT Stop Time 1228    PT Time Calculation (min) 41 min    Activity Tolerance Patient tolerated treatment well    Behavior During Therapy [facility] for tasks assessed/performed              Past Medical History:  Diagnosis Date   Allergy    Anemia    Normocytic   Antibiotic-induced yeast infection 09/28/2020   Anxiety    Asthma    Blood dyscrasia    Bronchitis 2005   Bursitis of left shoulder 09/06/2019   CKD (chronic kidney disease)    CLASS 1-EXOPHTHALMOS-THYROTOXIC 02/08/2007   COVID-19 virus infection 04/28/2022   Diabetes mellitus without complication ([place])    Family history of breast cancer    Family history of lung cancer    Family history of prostate cancer    Gastroenteritis 07/10/07   Genetic testing 07/25/2018   [employer] + RNA Insight was ordered.  Genes Analyzed (43 total): APC*, ATM*, AXIN2, BARD1, BMPR1A, BRCA1*, BRCA2*, BRIP1*, CDH1*, CDK4, CDKN2A, CHEK2*, DICER1, GALNT12, HOXB13, MEN1, MLH1*, MRE11A, MSH2*, MSH3, MSH6*, MUTYH*, NBN, NF1*, NTHL1, PALB2*, PMS2*, POLD1, POLE, PTEN*, RAD50, RAD51C*, RAD51D*, RET, SDHB, SDHD, SMAD4, SMARCA4, STK11 and TP53* (sequencing and deletion/duplication); EGFR (s   GERD 07/24/2006   GRAVE'S DISEASE 01/01/2008   History of hidradenitis suppurativa    History of kidney stones    History of thrush    HIV DISEASE 07/24/2006   dx March 05   Hyperlipidemia    HYPERTENSION 07/24/2006   Hyperthyroidism 08/2006   Grave's Disease -diffuse radiotracer uptake 08/25/06 Thyroid scan-Cold nodule to R lower lobe of thyrorid   Menometrorrhagia    hx of   Nephrolithiasis    Panniculitis 05/12/2020    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 Completed 2 yrs Prednisone after lung bx confirmation. No symptoms since then.   Suppurative hidradenitis    Thyroid cancer ([place])    THYROID NODULE, RIGHT 02/08/2007   Past Surgical History:  Procedure Laterality Date   APPLICATION OF WOUND VAC N/A 01/20/2021   Procedure: APPLICATION OF WOUND VAC;  Surgeon: Wallace Going, DO;  Location: [place];  Service: Plastics;  Laterality: N/A;   BREAST EXCISIONAL BIOPSY Right 04/26/2018   right axilla negative   BREAST EXCISIONAL BIOPSY Left 04/26/2018   left axilla negative   BREAST LUMPECTOMY Right 10/03/2018   malignant   BREAST LUMPECTOMY WITH RADIOACTIVE SEED AND SENTINEL LYMPH NODE BIOPSY Right 10/03/2018   Procedure: RIGHT BREAST LUMPECTOMY WITH RADIOACTIVE SEED AND SENTINEL LYMPH NODE MAPPING;  Surgeon: Erroll Luna, MD;  Location: [place];  Service: General;  Laterality: Right;   BREAST SURGERY  1997   Breast Reduction    CYSTOSCOPY W/ URETERAL STENT REMOVAL  11/09/2012   Procedure: CYSTOSCOPY WITH STENT REMOVAL;  Surgeon: Alexis Frock, MD;  Location[facility];  Service: Urology;  Laterality: Right;   CYSTOSCOPY WITH RETROGRADE PYELOGRAM, URETEROSCOPY AND STENT PLACEMENT  11/09/2012   Procedure: CYSTOSCOPY WITH RETROGRADE PYELOGRAM, URETEROSCOPY AND STENT PLACEMENT;  Surgeon: Alexis Frock, MD;  Location[facility];  Service: Urology;  Laterality: Left;  LEFT URETEROSCOPY, STONE MANIPULATION, left STENT exchange    CYSTOSCOPY WITH STENT PLACEMENT   10/02/2012   Procedure: CYSTOSCOPY WITH STENT PLACEMENT;  Surgeon: Alexis Frock, MD;  [facility];  Service: Urology;  Laterality: Left;   DEBRIDEMENT AND CLOSURE WOUND N/A 01/20/2021   Procedure: Excision of abdominal wound with closure;  Surgeon: Wallace Going, DO;  Location: [place];  Service: Plastics;  Laterality: N/A;   DILATION AND CURETTAGE OF UTERUS  Feb 2004   s/p for 1st trimester nonviable pregnancy   EYE SURGERY     sty under eyelid   INCISE AND DRAIN ABCESS  Nov 03   s/p I &D for righ inframmary fold hidradenitis   INCISION AND DRAINAGE PERITONSILLAR ABSCESS  Mar 03   IR CV LINE INJECTION  06/07/2018   IR IMAGING GUIDED PORT INSERTION  06/20/2018   IR REMOVAL TUN ACCESS W/ PORT W/O FL MOD SED  06/20/2018   IRRIGATION AND DEBRIDEMENT ABSCESS  01/31/2012   Procedure: IRRIGATION AND DEBRIDEMENT ABSCESS;  Surgeon: Shann Medal, MD;  [facility];  Service: General;  Laterality: Right;  right breast and axilla    NEPHROLITHOTOMY  10/02/2012   Procedure: NEPHROLITHOTOMY PERCUTANEOUS;  Surgeon: Alexis Frock, MD;  [facility];  Service: Urology;  Laterality: Right;  First Stage Percutaneous Nephrolithotomy with Surgeon Access, Left Ureteral Stent     NEPHROLITHOTOMY  10/04/2012   Procedure: NEPHROLITHOTOMY PERCUTANEOUS SECOND LOOK;  Surgeon: Alexis Frock, MD;  [facility];  Service: Urology;  Laterality: Right;      NEPHROLITHOTOMY  10/08/2012   Procedure: NEPHROLITHOTOMY PERCUTANEOUS;  Surgeon: Alexis Frock, MD;  [facility];  Service: Urology;  Laterality: Right;  THIRD STAGE, nephrostomy tube exchange x 2   NEPHROLITHOTOMY  10/11/2012   Procedure: NEPHROLITHOTOMY PERCUTANEOUS SECOND LOOK;  Surgeon: Alexis Frock, MD;  [facility];  Service: Urology;  Laterality: Right;  RIGHT 4 STAGE PERCUTANOUS NEPHROLITHOTOMY, right URETEROSCOPY WITH HOLMIUM LASER    PANNICULECTOMY N/A 12/21/2020   Procedure: PANNICULECTOMY;  Surgeon:  Wallace Going, DO;  Location: [place];  Service: Plastics;  Laterality: N/A;   PORT-A-CATH REMOVAL N/A 07/16/2020   Procedure: REMOVAL PORT-A-CATH;  Surgeon: Erroll Luna, MD;  Location: [place];  Service: General;  Laterality: N/A;   PORTACATH PLACEMENT Left 05/17/2018   Procedure: INSERTION PORT-A-CATH;  Surgeon: Coralie Keens, MD;  Location: [place];  Service: General;  Laterality: Left;   RADICAL NECK DISSECTION  04/12/2017   limited/notes 04/12/2017   RADICAL NECK DISSECTION N/A 04/12/2017   Procedure: RADICAL NECK DISSECTION;  Surgeon: Melida Quitter, MD;  Location: [place];  Service: ENT;  Laterality: N/A;  limited neck dissection 2 hours total   REDUCTION MAMMAPLASTY Bilateral 1998   RIGHT/LEFT HEART CATH AND CORONARY ANGIOGRAPHY N/A 03/12/2020   Procedure: RIGHT/LEFT HEART CATH AND CORONARY ANGIOGRAPHY;  Surgeon: Jolaine Artist, MD;  Location: [place] [facility];  Service: Cardiovascular;  Laterality: N/A;   [place]  04/12/2017   completion/notes  04/12/2017   THYROIDECTOMY N/A 04/12/2017   Procedure: THYROIDECTOMY;  Surgeon: Melida Quitter, MD;  Location: [place];  Service: ENT;  Laterality: N/A;  Completion Thyroidectomy   TOTAL THYROIDECTOMY  2010   Patient Active Problem List   Diagnosis Date Noted   Chronic pain of both knees 07/29/2022   Pruritus 11/26/2021   Chronic illness 10/27/2021   Acute renal failure superimposed on stage 4 chronic kidney disease ([place]) 10/24/2021   Acute pancreatitis 10/23/2021   Lymphadenopathy 07/01/2021   Paresthesias 06/30/2021   Chronic pain of right hand 06/30/2021   Chronic midline low back pain with right-sided sciatica 06/30/2021   COVID-19 long hauler 05/11/2021   Voice hoarseness 12/15/2020   Recurrent falls 11/05/2020   Impingement syndrome of left shoulder region 05/27/2020   Inclusion cyst 05/01/2020   Sebaceous cyst 04/21/2020   Postoperative breast asymmetry 02/28/2020    Cognitive dysfunction 02/21/2020   Chronic gout 01/27/2020   Hematuria 12/10/2019   Neuropathy due to chemotherapeutic drug ([place]) 12/10/2019   Pain in joint of left shoulder 09/05/2019   Dysphagia 07/23/2019   CKD stage 3 due to type 2 diabetes mellitus ([place]) 07/12/2019   Normocytic anemia 07/12/2019   Preventative health care 09/10/2018   Port-A-Cath in place 07/12/2018   Family history of breast cancer    Family history of prostate cancer    Family history of lung cancer    Malignant neoplasm of lower-inner quadrant of right breast of female, estrogen receptor positive ([place]) 05/10/2018   Thyroid cancer ([place])    Hyperlipidemia 04/26/2017   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[place]) 04/12/2017   GAD (generalized anxiety disorder) 04/11/2017   Mass of left side of neck 11/24/2016   Laryngopharyngeal reflux (LPR) 11/24/2016   Upper airway cough syndrome 11/11/2016   Obesity (BMI 30-39.9) 10/14/2016   Allergic rhinitis 02/18/2016   Non-insulin dependent type 2 diabetes mellitus ([place]) 06/23/2015   Renal calculi 10/29/2014   Recurrent boils 06/11/2014   Rash 02/16/2012   Hidradenitis suppurativa 01/30/2012   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   Sarcoidosis 02/08/2007   Cigarette smoker 02/08/2007   HIV disease ([place]) 07/24/2006   Depression 07/24/2006   Essential hypertension 07/24/2006   Asthma 07/24/2006    PCP: [name], NP  REFERRING PROVIDER: [name], NP  REFERRING DIAG: M25.561,M25.562,G89.29 (ICD-10-CM) - Chronic pain of both knees  THERAPY DIAG:  Chronic pain of right knee  Chronic pain of left knee  Muscle weakness (generalized)  Difficulty in walking, not elsewhere classified  Other low back pain  Rationale for Evaluation and Treatment Rehabilitation  ONSET DATE: Chronic knee pain for 3 years  SUBJECTIVE:   SUBJECTIVE STATEMENT:  My back is more stiff today than normal. Yesterday I was having pains in my knee, not sure if  its the weather change or what. HEP is going good, a couple I do often with the resistance band and the ones sitting up when I don't forget those. I walked the dog twice and cleaned my closet out yesterday, lots of repetitive motion and wondering if that's why my back is stiffer today.   PERTINENT HISTORY: Asthma, chronic stage 4 kidney disease, type 2 diabetes, Grave's disease, history of kidney stones, HIV, HLD, HTN, thyroid  PAIN:  Are you having pain? Yes: NPRS scale: 4-5/10 Pain location: back>knees  Pain description: dull, nothing sharp  Aggravating factors: "not sure that's a good question"  Relieving factors: movement  PRECAUTIONS: None  WEIGHT BEARING RESTRICTIONS: No  FALLS:  Has patient  fallen in last 6 months? No  LIVING ENVIRONMENT:  Stairs: Yes has hand rails and feels like she has to pull herself up   OCCUPATION: none  PLOF: Independent  PATIENT GOALS: Improve pain   OBJECTIVE:   DIAGNOSTIC FINDINGS: Recent bilateral knee Xrays were negative   PATIENT SURVEYS:  Eval: FOTO 31% functional, goal is 48%  COGNITION: Overall cognitive status: Within functional limits for tasks assessed     SENSATION: WFL  EDEMA:    MUSCLE LENGTH: Hamstrings: tight bilat Thomas test:tight bilat  POSTURE:   PALPATION:   LOWER EXTREMITY ROM:  Active ROM Right eval Left eval  Hip flexion    Hip extension    Hip abduction    Hip adduction    Hip internal rotation    Hip external rotation    Knee flexion 120 125  Knee extension 0 0  Ankle dorsiflexion    Ankle plantarflexion    Ankle inversion    Ankle eversion     (Blank rows = not tested)  Lumbar AROM  Eval Flexion 50% Extension 25% Rotation 25% bilat  LOWER EXTREMITY MMT:  MMT tested in sitting Right eval Left eval  Hip flexion 3 3  Hip extension    Hip abduction 4 4  Hip adduction    Hip internal rotation    Hip external rotation    Knee flexion 4 4  Knee extension 4 4  Ankle  dorsiflexion    Ankle plantarflexion    Ankle inversion    Ankle eversion     (Blank rows = not tested)  LOWER EXTREMITY SPECIAL TESTS:  + SLR test bilat  FUNCTIONAL TESTS:  Eval: 5 times sit to stand: 25.16 must use UE support to push up  GAIT: Comments: independent Ambulator, limited by pain at times with slower velocity   TODAY'S TREATMENT:   08/17/22  TherEx  Nustep L4 x6 minutes BLEs only Bridge with red TB/hip ABD x10  Sidelying clams red TB x10 B very weak, needed cues for control PPT x12 with 3 second holds  PPT + march x10 B  STS x10 with red TB above knees, eccentric lower increased pain lateral L patella/distal lateral thigh L LE ITB stretch 1x45 seconds  Practiced correct technique for bed mobility to protect low back   Manual:  Tennis ball STM L lateral thigh to tolerance    Education  Exercise form/purpose, progressions today, HEP review as needed/appropriate    Eval HEP creation and review, see below for details Moist heat to low back and knees   PATIENT EDUCATION: Education details: see above  Person educated: Patient Education method: Consulting civil engineer, Media planner, Verbal cues, and Handouts Education comprehension: verbalized understanding and needs further education   HOME EXERCISE PROGRAM: Access Code: [email or web address] URL: [email or web address] Date: 08/11/2022 Prepared by: Elsie Ra  Exercises - Seated Knee Flexion AAROM  - 2 x daily - 6 x weekly - 2 sets - 10 reps - 5 hold - Seated Knee Extension with Resistance  - 2 x daily - 6 x weekly - 2 sets - 10 reps - Sit to Stand with Armchair  - 2 x daily - 6 x weekly - 1-2 sets - 5 reps - Seated Lumbar Flexion Stretch  - 2 x daily - 6 x weekly - 1 sets - 10 reps - 10 hold - Marching with Resistance  - 1 x daily - 6 x weekly - 1-2 sets - 10 reps - Standing Lumbar Extension  - 2 x daily -  6 x weekly - 1-2 sets - 10 reps - 5 hold  ASSESSMENT:  CLINICAL IMPRESSION:   [place]  arrives today doing OK, back is more stiff today than LEs but she did a lot of repetitive motions cleaning closet yesterday which might explain this. Warmed up on Nustep, then worked on hip/general LE and core strengthening this morning as tolerated. Also worked on [employer] as able and time allowed, will continue to progress as able moving forward.   OBJECTIVE IMPAIRMENTS: decreased activity tolerance, difficulty walking, decreased balance, decreased endurance, decreased mobility, decreased ROM, decreased strength, impaired flexibility, impaired UE/LE use, postural dysfunction, and pain.  ACTIVITY LIMITATIONS: bending, lifting, carry, locomotion, cleaning, community activity, driving, and or occupation  PERSONAL FACTORS: Asthma, chronic stage 4 kidney disease, type 2 diabetes, Grave's disease, history of kidney stones, HIV, HLD, HTN, thyroid  are also affecting patient's functional outcome.  REHAB POTENTIAL: Good  CLINICAL DECISION MAKING: Stable/uncomplicated  EVALUATION COMPLEXITY: Low    GOALS: Short term PT Goals Target date: 09/08/2022 Pt will be I and compliant with HEP. Baseline:  Goal status: New Pt will decrease pain by 25% overall (less than  Baseline: Goal status: New  Long term PT goals Target date: 10/06/2022 Pt will improve  hip/knee strength to at least 4+/5 MMT to improve functional strength Baseline: Goal status: New Pt will improve FOTO to at least 48% functional to show improved function Baseline: Goal status: New Pt will reduce pain by overall 50% (less than 4/10) overall with usual activity including sit to stand, stairs, and ambulation Baseline:8 Goal status: New  PLAN: PT FREQUENCY: 1-2 times per week   PT DURATION: 8 weeks  PLANNED INTERVENTIONS (unless contraindicated): aquatic PT, Canalith repositioning, cryotherapy, Electrical stimulation, Iontophoresis with 4 mg/ml dexamethasome, Moist heat, traction, Ultrasound, gait training, Therapeutic  exercise, balance training, neuromuscular re-education, patient/family education, prosthetic training, manual techniques, passive ROM, dry needling, taping, vasopnuematic device, vestibular, spinal manipulations, joint manipulations  PLAN FOR NEXT SESSION:  leg strength focus, HEP progressions PRN, continue manual PRN    Eleisha Branscomb U PT DPT PN2  08/17/2022, 12:28 PM

## 2022-08-19 ENCOUNTER — Other Ambulatory Visit: Payer: Commercial Managed Care - HMO

## 2022-08-20 ENCOUNTER — Other Ambulatory Visit: Payer: Self-pay | Admitting: Cardiovascular Disease

## 2022-08-23 ENCOUNTER — Other Ambulatory Visit (INDEPENDENT_AMBULATORY_CARE_PROVIDER_SITE_OTHER): Payer: Commercial Managed Care - HMO

## 2022-08-23 DIAGNOSIS — E890 Postprocedural hypothyroidism: Secondary | ICD-10-CM

## 2022-08-23 DIAGNOSIS — C73 Malignant neoplasm of thyroid gland: Secondary | ICD-10-CM

## 2022-08-23 LAB — T4, FREE: Free T4: 1.49 ng/dL (ref 0.60–1.60)

## 2022-08-23 LAB — TSH: TSH: 2.61 u[IU]/mL (ref 0.35–5.50)

## 2022-08-24 ENCOUNTER — Encounter: Payer: Commercial Managed Care - HMO | Admitting: Physical Therapy

## 2022-08-25 LAB — THYROGLOBULIN LEVEL: Thyroglobulin: 0.6 ng/mL — ABNORMAL LOW

## 2022-08-25 LAB — THYROGLOBULIN ANTIBODY: Thyroglobulin Ab: <1 [IU]/mL

## 2022-08-26 ENCOUNTER — Telehealth (HOSPITAL_BASED_OUTPATIENT_CLINIC_OR_DEPARTMENT_OTHER): Payer: Self-pay | Admitting: Cardiovascular Disease

## 2022-08-26 NOTE — Telephone Encounter (Signed)
Left message for patient to call and schedule over due follow up with Dr. Rockwood---to refill medications. °

## 2022-08-29 ENCOUNTER — Telehealth: Payer: Self-pay | Admitting: Primary Care

## 2022-08-29 ENCOUNTER — Ambulatory Visit (INDEPENDENT_AMBULATORY_CARE_PROVIDER_SITE_OTHER): Payer: Commercial Managed Care - HMO | Admitting: Nurse Practitioner

## 2022-08-29 VITALS — BP 152/84 | HR 70 | Temp 96.8°F | Resp 16 | Wt 220.5 lb

## 2022-08-29 DIAGNOSIS — M545 Low back pain, unspecified: Secondary | ICD-10-CM | POA: Diagnosis not present

## 2022-08-29 DIAGNOSIS — I10 Essential (primary) hypertension: Secondary | ICD-10-CM

## 2022-08-29 DIAGNOSIS — R519 Headache, unspecified: Secondary | ICD-10-CM | POA: Diagnosis not present

## 2022-08-29 DIAGNOSIS — R1011 Right upper quadrant pain: Secondary | ICD-10-CM | POA: Insufficient documentation

## 2022-08-29 DIAGNOSIS — R110 Nausea: Secondary | ICD-10-CM | POA: Insufficient documentation

## 2022-08-29 DIAGNOSIS — R8290 Unspecified abnormal findings in urine: Secondary | ICD-10-CM | POA: Insufficient documentation

## 2022-08-29 LAB — COMPREHENSIVE METABOLIC PANEL
ALT: 11 U/L (ref 0–35)
AST: 15 U/L (ref 0–37)
Albumin: 3.2 g/dL — ABNORMAL LOW (ref 3.5–5.2)
Alkaline Phosphatase: 77 U/L (ref 39–117)
BUN: 34 mg/dL — ABNORMAL HIGH (ref 6–23)
CO2: 31 mEq/L (ref 19–32)
Calcium: 8.1 mg/dL — ABNORMAL LOW (ref 8.4–10.5)
Chloride: 104 mEq/L (ref 96–112)
Creatinine, Ser: 3.01 mg/dL — ABNORMAL HIGH (ref 0.40–1.20)
GFR: 17.05 mL/min — ABNORMAL LOW (ref >60.00)
Glucose, Bld: 76 mg/dL (ref 70–99)
Potassium: 3.4 mEq/L — ABNORMAL LOW (ref 3.5–5.1)
Sodium: 142 mEq/L (ref 135–145)
Total Bilirubin: 0.4 mg/dL (ref 0.2–1.2)
Total Protein: 6.6 g/dL (ref 6.0–8.3)

## 2022-08-29 LAB — CBC
HCT: 30.3 % — ABNORMAL LOW (ref 36.0–46.0)
Hemoglobin: 10.2 g/dL — ABNORMAL LOW (ref 12.0–15.0)
MCHC: 33.5 g/dL (ref 30.0–36.0)
MCV: 94.1 fl (ref 78.0–100.0)
Platelets: 185 10*3/uL (ref 150.0–400.0)
RBC: 3.22 Mil/uL — ABNORMAL LOW (ref 3.87–5.11)
RDW: 21.4 % — ABNORMAL HIGH (ref 11.5–15.5)
WBC: 7.6 10*3/uL (ref 4.0–10.5)

## 2022-08-29 LAB — POCT URINALYSIS DIPSTICK
Bilirubin, UA: NEGATIVE
Blood, UA: NEGATIVE
Glucose, UA: NEGATIVE
Ketones, UA: NEGATIVE
Nitrite, UA: 0.2
Protein, UA: POSITIVE — AB
Spec Grav, UA: 1.02 (ref 1.010–1.025)
Urobilinogen, UA: NEGATIVE E.U./dL — AB
pH, UA: 6 (ref 5.0–8.0)

## 2022-08-29 LAB — LIPASE: Lipase: 26 U/L (ref 11.0–59.0)

## 2022-08-29 MED ORDER — PROMETHAZINE HCL 12.5 MG PO TABS: 12.5000 mg | ORAL_TABLET | Freq: Three times a day (TID) | ORAL | 0 refills | Status: DC | PRN

## 2022-08-29 MED ORDER — OXYCODONE HCL 5 MG PO TABS: 5.0000 mg | ORAL_TABLET | Freq: Three times a day (TID) | ORAL | 0 refills | Status: DC | PRN

## 2022-08-29 NOTE — Telephone Encounter (Signed)
Patient was seen and evaluated by me in office

## 2022-08-29 NOTE — Telephone Encounter (Signed)
Patient called and stated that she had to go to ER in [place] on Saturday, she had labs done and they stated she blood urine, passed a kidney stone and she has pain in lower back. Also stated she don't know if she believes the ER. Patient was sent to access nurse.

## 2022-08-29 NOTE — Assessment & Plan Note (Signed)
Still experiencing some nausea.  They did write her some Zofran that she did lose.  Since patient has borderline QTc and is on Cipro will elect to treat with promethazine 12.5 mg 3 times daily as needed.  Sedation precautions reviewed

## 2022-08-29 NOTE — Assessment & Plan Note (Signed)
Patient has not taken her candesartan yet today.  Blood pressure elevated at beginning of office visit did come down some upon recheck.  Encourage patient to check blood pressure at home.  This could be causing some of the headache she is experiencing.

## 2022-08-29 NOTE — Assessment & Plan Note (Signed)
Do think this is a referred pain patient is experiencing.  Did check UA in office that showed small leukocytes patient already being covered with Cipro 500 mg twice daily.  We will send out for culture for sensitivity.  Did write short course of oxycodone 5 mg 3 times daily as needed severe pain for 3 days.  Patient's been using a lot of ibuprofen along with Tylenol patient has poor renal function and want to avoid excess Tylenol and avoid NSAIDs if possible.  Sedation precautions reviewed patient's not to take oxycodone and promethazine within 2 hours of each other.

## 2022-08-29 NOTE — Assessment & Plan Note (Signed)
Noted on exam.  Patient still has gallbladder intact.  Curious if she is having some biliary dysfunction did review CT scan for renal stones that did show biliary ducts were within normal limits.  Check basic blood work if normal consider right upper quadrant ultrasound follow-up to HIDA scan if no resolution of symptoms.

## 2022-08-29 NOTE — Assessment & Plan Note (Signed)
UA still showing some leukocytes and protein.  Protein expected with patient's renal function.  We will send for culture pending result.

## 2022-08-29 NOTE — Telephone Encounter (Signed)
Per appt notes pt has already been arrived to see Romilda Garret NP. Sending note to Romilda Garret NP and [place] [facility].

## 2022-08-29 NOTE — Assessment & Plan Note (Signed)
Secondary to hitting her head on a trunk lid of a car.  Patient's blood pressure also elevated.  Neurological exam intact medication as prescribed follow-up if no improvement

## 2022-08-29 NOTE — Telephone Encounter (Signed)
[facility] - Client TELEPHONE ADVICE RECORD AccessNurse Patient Name: PAYSEN HO UGH Gender: Female DOB: 1965-12-04 Age: 56 Y 3 M 15 D Return Phone Number: [phone] (Primary) Address: City/ State/ Zip: [place]  [address] Client [facility] Day - Client Client Site [place] - Day Provider Alma Friendly - NP Contact Type Call Who Is Calling Patient / Member / Family / Caregiver Call Type Triage / Clinical Relationship To Patient Self Return Phone Number [phone] (Primary) Chief Complaint SEVERE ABDOMINAL PAIN - Severe pain in abdomen Reason for Call Symptomatic / Request for [place] states he is calling from the office with a pt on the line having severe pain and went to the ER and was told she has blood in her urine and pain in her lower back and also that she pass a kidney stone. Caller states she doesn't believe the ER is giving her the right information. Caller states she is not sure she was dx right and they didnt give her any medication due to shortage. Translation No Nurse Assessment Nurse: Vallery Sa, RN, Cathy Date/Time (Eastern Time): 08/29/2022 10:12:32 AM Confirm and document reason for call. If symptomatic, describe symptoms. ---[name] states she was seen in the ER last night passing of kidney stone, UTI and blood in her urine. She continues to have bilateral flank pain (current pain rated as a 7 on the 1 to 10 scale). She last passed urine about 2 minutes ago and there was no blood resent. No fever. Alert and responsive. Does the patient have any new or worsening symptoms? ---Yes Will a triage be completed? ---Yes Related visit to physician within the last 2 weeks? ---Yes Does the PT have any chronic conditions? (i.e. diabetes, asthma, this includes High risk factors for pregnancy, etc.) ---Yes List chronic conditions. ---Kidney  Stones, Diabetes, High Blood Pressure and Cholesterol, Kidney Stone removed in July 2023 Is the patient pregnant or possibly pregnant? (Ask all females between the ages of 14-55) ---No Is this a behavioral health or substance abuse call? ---No PLEASE NOTE: All timestamps contained within this report are represented as [place] Standard Time. CONFIDENTIALTY NOTICE: This fax transmission is intended only for the addressee. It contains information that is legally privileged, confidential or otherwise protected from use or disclosure. If you are not the intended recipient, you are strictly prohibited from reviewing, disclosing, copying using or disseminating any of this information or taking any action in reliance on or regarding this information. If you have received this fax in error, please notify us immediately by telephone so that we can arrange for its return to [place]. Phone: [phone], Toll-Free: [phone], Fax: [phone] Page: 2 of 2 Call Id: [id number] Guidelines Guideline Title Affirmed Question Affirmed Notes Nurse Date/Time Eilene Ghazi Time) Flank Pain MODERATE pain (e.g., interferes with normal activities or awakens from sleep) Vallery Sa, [place], [facility] 08/29/2022 10:15:36 AM Disp. Time Eilene Ghazi Time) Disposition Final User 08/29/2022 10:10:13 AM Send to Urgent Queue Cathlean Sauer 08/29/2022 10:17:12 AM See PCP within 24 Hours Yes Vallery Sa, RN, [place] Final Disposition 08/29/2022 10:17:12 AM See PCP within 24 Hours Yes [place], RN, Rosey [place] Disagree/Comply Comply Caller Understands Yes PreDisposition Did not know what to do Care Advice Given Per Guideline SEE PCP WITHIN 24 HOURS: * IF OFFICE WILL BE OPEN: You need to be examined within the next 24 hours. Call your doctor (or NP/PA) when the office opens and make an appointment. PAIN MEDICINES: * For  pain relief, you can take either acetaminophen, ibuprofen, or naproxen. * ACETAMINOPHEN - REGULAR STRENGTH TYLENOL: Take 650  mg (two 325 mg pills) by mouth every 4 to 6 hours as needed. Each Regular Strength Tylenol pill has 325 mg of acetaminophen. The most you should take is 10 pills a day (3,250 mg total). Note: In [place], the maximum is 12 pills a day (3,900 mg total). CALL BACK IF: * Fever over 100.4 F (38.0 C) * You become worse CARE ADVICE given per Flank Pain (Adult) guideline. Referrals REFERRED TO PCP OFFIC

## 2022-08-29 NOTE — Patient Instructions (Signed)
Nice to see you today I will be in touch with the labs once I have them The nausea medication I gave you can make you sleepy so be careful. Do not take that and the pain medication together. Separate by 2 hours at least  Follow up if you do not improve

## 2022-08-29 NOTE — Progress Notes (Signed)
Established Patient Office Visit  Subjective   Patient ID: [name], female    DOB: Nov 09, 1965  Age: 56 y.o. MRN: [id number]  Chief Complaint  Patient presents with   follow up after ER visit    woke up on 08/27/22 with severe pain on the left and right lower back area, got worse and started to vomit, went to ER in [place] on 08/27/22- she was told she may have passed a kidney stone, UTI and dehydrated. She was given Cipro, anti nausea medication, all the pain medication was out of stock-tried Norco and Oxycontin and percocet. She has been taking Tylenol and Ibuprofen. Pain is still present better but still not resolved. No more vomiting since 08/27/22 but nausea and headache present   Head Injury    Hit her head/forehead on the trunk of a car on 08/27/22. Headache present. Did not address this at the ER on 08/27/22      ED follow up: Patient was seen in the emergency department 08/27/2022 with left lower abdominal/back pain.  They did do an evaluation inclusive of CT stone protocol which did show nonobstructing stones on the right side with no evidence of hydronephrosis obstruction or hydroureter.  Patient did have basic blood work done that demonstrated some anemia, decreased renal function with a BUN of 40, creatinine of 3.0, GFR of 18 seems to be baseline this patient had GFR of 17 in May 06, 2022.  Blood pressure was also noted to be elevated in the emergency department at 169/89.  States that she is using ibuprofen and tylenol to help with the pain. States that she did take an oxycodone last night that helped. States that the pain is left and right lower back. States it that it feels heart beat tthat is all the time. States that she cannot really bend or lay flat. States that when she take s adeep breath she can feel it.    Head injury: States that on 07/26/2022 she hit her head. States that her BF was picking her up and states that she hit her head on the trunk. Did not  pass out. Head was hurting. Does have headache. States that the headache has been since Friday. States that Saturday it was worse. With more worse with morphine. Headhce is 7/10. States that tyelnol helps and that she has not taken any today    Review of Systems  Constitutional:  Negative for chills and fever.  HENT:  Negative for sore throat.   Eyes:  Negative for blurred vision and double vision.  Respiratory:  Negative for shortness of breath.   Cardiovascular:  Negative for chest pain.  Gastrointestinal:  Positive for abdominal pain and nausea. Negative for vomiting.  Genitourinary:  Positive for frequency.  Neurological:  Positive for headaches. Negative for dizziness, tingling and weakness.      Objective:     BP (!) 152/84   Pulse 70   Temp (!) 96.8 F (36 C) (Temporal)   Resp 16   Wt 220 lb 8 oz (100 kg)   LMP 03/31/2014 (LMP Unknown)   SpO2 99%   BMI 36.69 kg/m    Physical Exam Vitals and nursing note reviewed.  Constitutional:      Appearance: Normal appearance. She is obese.  HENT:     Right Ear: Tympanic membrane, ear canal and external ear normal.     Left Ear: Tympanic membrane, ear canal and external ear normal.     Mouth/Throat:  Mouth: Mucous membranes are dry.     Pharynx: Oropharynx is clear.  Eyes:     Extraocular Movements: Extraocular movements intact.     Pupils: Pupils are equal, round, and reactive to light.  Cardiovascular:     Rate and Rhythm: Normal rate and regular rhythm.     Heart sounds: Normal heart sounds.  Pulmonary:     Effort: Pulmonary effort is normal.     Breath sounds: Normal breath sounds.  Abdominal:     General: Bowel sounds are normal. There is no distension.     Palpations: There is no mass.     Tenderness: There is generalized abdominal tenderness and tenderness in the right upper quadrant.     Hernia: No hernia is present.    Musculoskeletal:        General: No signs of injury.  Lymphadenopathy:      Cervical: No cervical adenopathy.  Neurological:     General: No focal deficit present.     Mental Status: She is alert.     Comments: Bilateral upper and lower extremity strength 5/5      Results for orders placed or performed in visit on 08/29/22  POCT urinalysis dipstick  Result Value Ref Range   Color, UA yellow    Clarity, UA clear    Glucose, UA Negative Negative   Bilirubin, UA negative    Ketones, UA negative    Spec Grav, UA 1.020 1.010 - 1.025   Blood, UA negative    pH, UA 6.0 5.0 - 8.0   Protein, UA Positive (A) Negative   Urobilinogen, UA negative (A) 0.2 or 1.0 E.U./dL   Nitrite, UA 0.2    Leukocytes, UA Small (1+) (A) Negative   Appearance     Odor        The 10-year ASCVD risk score (Arnett DK, et al., 2019) is: 20.4%    Assessment & Plan:   Problem List Items Addressed This Visit       Cardiovascular and Mediastinum   Essential hypertension    Patient has not taken her candesartan yet today.  Blood pressure elevated at beginning of office visit did come down some upon recheck.  Encourage patient to check blood pressure at home.  This could be causing some of the headache she is experiencing.      Relevant Orders   CBC   Comprehensive metabolic panel     Other   Acute nonintractable headache - Primary    Secondary to hitting her head on a trunk lid of a car.  Patient's blood pressure also elevated.  Neurological exam intact medication as prescribed follow-up if no improvement      Relevant Medications   oxyCODONE (OXY IR/ROXICODONE) 5 MG immediate release tablet   Bilateral low back pain without sciatica    Do think this is a referred pain patient is experiencing.  Did check UA in office that showed small leukocytes patient already being covered with Cipro 500 mg twice daily.  We will send out for culture for sensitivity.  Did write short course of oxycodone 5 mg 3 times daily as needed severe pain for 3 days.  Patient's been using a lot of  ibuprofen along with Tylenol patient has poor renal function and want to avoid excess Tylenol and avoid NSAIDs if possible.  Sedation precautions reviewed patient's not to take oxycodone and promethazine within 2 hours of each other.      Relevant Medications   oxyCODONE (OXY IR/ROXICODONE)  5 MG immediate release tablet   Other Relevant Orders   POCT urinalysis dipstick (Completed)   Right upper quadrant abdominal pain    Noted on exam.  Patient still has gallbladder intact.  Curious if she is having some biliary dysfunction did review CT scan for renal stones that did show biliary ducts were within normal limits.  Check basic blood work if normal consider right upper quadrant ultrasound follow-up to HIDA scan if no resolution of symptoms.      Relevant Medications   oxyCODONE (OXY IR/ROXICODONE) 5 MG immediate release tablet   Other Relevant Orders   CBC   Comprehensive metabolic panel   Lipase   POCT urinalysis dipstick (Completed)   Nausea    Still experiencing some nausea.  They did write her some Zofran that she did lose.  Since patient has borderline QTc and is on Cipro will elect to treat with promethazine 12.5 mg 3 times daily as needed.  Sedation precautions reviewed      Relevant Medications   promethazine (PHENERGAN) 12.5 MG tablet   Abnormal urine finding    UA still showing some leukocytes and protein.  Protein expected with patient's renal function.  We will send for culture pending result.      Relevant Orders   Urine Culture    Return if symptoms worsen or fail to improve.    Romilda Garret, NP

## 2022-08-30 LAB — URINE CULTURE
MICRO NUMBER:: 14180486
Result:: NO GROWTH
SPECIMEN QUALITY:: ADEQUATE

## 2022-08-31 ENCOUNTER — Telehealth: Payer: Self-pay | Admitting: Nurse Practitioner

## 2022-08-31 ENCOUNTER — Emergency Department (HOSPITAL_BASED_OUTPATIENT_CLINIC_OR_DEPARTMENT_OTHER)
Admission: EM | Admit: 2022-08-31 | Discharge: 2022-08-31 | Disposition: A | Discharge status: Home / Self Care | Payer: Commercial Managed Care - HMO | Attending: Emergency Medicine | Admitting: Emergency Medicine

## 2022-08-31 ENCOUNTER — Other Ambulatory Visit: Payer: Self-pay

## 2022-08-31 ENCOUNTER — Emergency Department (HOSPITAL_BASED_OUTPATIENT_CLINIC_OR_DEPARTMENT_OTHER): Payer: Commercial Managed Care - HMO

## 2022-08-31 ENCOUNTER — Emergency Department (HOSPITAL_COMMUNITY)
Admission: EM | Admit: 2022-08-31 | Discharge: 2022-08-31 | Disposition: A | Discharge status: Home / Self Care | Payer: Commercial Managed Care - HMO

## 2022-08-31 ENCOUNTER — Encounter: Payer: Self-pay | Admitting: Oncology

## 2022-08-31 DIAGNOSIS — R112 Nausea with vomiting, unspecified: Secondary | ICD-10-CM | POA: Diagnosis present

## 2022-08-31 DIAGNOSIS — N189 Chronic kidney disease, unspecified: Secondary | ICD-10-CM | POA: Diagnosis not present

## 2022-08-31 DIAGNOSIS — E1122 Type 2 diabetes mellitus with diabetic chronic kidney disease: Secondary | ICD-10-CM | POA: Diagnosis not present

## 2022-08-31 DIAGNOSIS — N200 Calculus of kidney: Secondary | ICD-10-CM | POA: Insufficient documentation

## 2022-08-31 DIAGNOSIS — Z79899 Other long term (current) drug therapy: Secondary | ICD-10-CM | POA: Insufficient documentation

## 2022-08-31 DIAGNOSIS — Z7984 Long term (current) use of oral hypoglycemic drugs: Secondary | ICD-10-CM | POA: Diagnosis not present

## 2022-08-31 DIAGNOSIS — Z85850 Personal history of malignant neoplasm of thyroid: Secondary | ICD-10-CM | POA: Insufficient documentation

## 2022-08-31 DIAGNOSIS — R1011 Right upper quadrant pain: Secondary | ICD-10-CM

## 2022-08-31 DIAGNOSIS — N23 Unspecified renal colic: Secondary | ICD-10-CM

## 2022-08-31 DIAGNOSIS — R8281 Pyuria: Secondary | ICD-10-CM

## 2022-08-31 LAB — URINALYSIS, ROUTINE W REFLEX MICROSCOPIC
Bilirubin Urine: NEGATIVE
Glucose, UA: NEGATIVE mg/dL
Ketones, ur: NEGATIVE mg/dL
Nitrite: NEGATIVE
Protein, ur: >300 mg/dL — AB
Specific Gravity, Urine: 1.013 (ref 1.005–1.030)
pH: 6.5 (ref 5.0–8.0)

## 2022-08-31 LAB — CBC
HCT: 33.3 % — ABNORMAL LOW (ref 36.0–46.0)
Hemoglobin: 11.1 g/dL — ABNORMAL LOW (ref 12.0–15.0)
MCH: 31.1 pg (ref 26.0–34.0)
MCHC: 33.3 g/dL (ref 30.0–36.0)
MCV: 93.3 fL (ref 80.0–100.0)
Platelets: 216 10*3/uL (ref 150–400)
RBC: 3.57 MIL/uL — ABNORMAL LOW (ref 3.87–5.11)
RDW: 18.7 % — ABNORMAL HIGH (ref 11.5–15.5)
WBC: 8.4 10*3/uL (ref 4.0–10.5)
nRBC: 0 % (ref 0.0–0.2)

## 2022-08-31 LAB — COMPREHENSIVE METABOLIC PANEL
ALT: 11 U/L (ref 0–44)
AST: 15 U/L (ref 15–41)
Albumin: 3.4 g/dL — ABNORMAL LOW (ref 3.5–5.0)
Alkaline Phosphatase: 72 U/L (ref 38–126)
Anion gap: 9 (ref 5–15)
BUN: 29 mg/dL — ABNORMAL HIGH (ref 6–20)
CO2: 30 mmol/L (ref 22–32)
Calcium: 9 mg/dL (ref 8.9–10.3)
Chloride: 102 mmol/L (ref 98–111)
Creatinine, Ser: 2.89 mg/dL — ABNORMAL HIGH (ref 0.44–1.00)
GFR, Estimated: 19 mL/min — ABNORMAL LOW (ref >60)
Glucose, Bld: 80 mg/dL (ref 70–99)
Potassium: 3.3 mmol/L — ABNORMAL LOW (ref 3.5–5.1)
Sodium: 141 mmol/L (ref 135–145)
Total Bilirubin: 0.4 mg/dL (ref 0.3–1.2)
Total Protein: 7.7 g/dL (ref 6.5–8.1)

## 2022-08-31 LAB — LIPASE, BLOOD: Lipase: 23 U/L (ref 11–51)

## 2022-08-31 MED ORDER — HYDRALAZINE HCL 25 MG PO TABS
50.0000 mg | ORAL_TABLET | Freq: Once | ORAL | Status: AC
  Administered 2022-08-31: 50 mg via ORAL
  Filled 2022-08-31: qty 2

## 2022-08-31 MED ORDER — SODIUM CHLORIDE 0.9 % IV BOLUS
1000.0000 mL | Freq: Once | INTRAVENOUS | Status: AC
  Administered 2022-08-31: 1000 mL via INTRAVENOUS

## 2022-08-31 MED ORDER — OXYCODONE HCL 5 MG PO TABS: 5.0000 mg | ORAL_TABLET | Freq: Four times a day (QID) | ORAL | 0 refills | Status: DC | PRN

## 2022-08-31 MED ORDER — CARVEDILOL 12.5 MG PO TABS
25.0000 mg | ORAL_TABLET | Freq: Once | ORAL | Status: AC
  Administered 2022-08-31: 25 mg via ORAL
  Filled 2022-08-31: qty 2

## 2022-08-31 MED ORDER — SODIUM CHLORIDE 0.9 % IV SOLN
1.0000 g | Freq: Once | INTRAVENOUS | Status: AC
  Administered 2022-08-31: 1 g via INTRAVENOUS
  Filled 2022-08-31: qty 10

## 2022-08-31 MED ORDER — CEFDINIR 300 MG PO CAPS: 300.0000 mg | ORAL_CAPSULE | Freq: Every day | ORAL | 0 refills | Status: DC

## 2022-08-31 MED ORDER — ACETAMINOPHEN 500 MG PO TABS
1000.0000 mg | ORAL_TABLET | Freq: Once | ORAL | Status: AC
  Administered 2022-08-31: 1000 mg via ORAL
  Filled 2022-08-31: qty 2

## 2022-08-31 NOTE — Telephone Encounter (Signed)
Noted  

## 2022-08-31 NOTE — ED Triage Notes (Signed)
Pt c/o lower back pain, increased urinary frequency, nausea, and vomiting since last Saturday. Pt was seen at an ER in [place] while she was out of town and was told she had a uti and possibly could have passed a kidney stone. Pt states symptoms have just not improved. She also has hx of pancreatitis.

## 2022-08-31 NOTE — Telephone Encounter (Signed)
She can try Senokot-S and follow the directions on the box. I will order the [place] for [place] location

## 2022-08-31 NOTE — ED Notes (Signed)
Reviewed AVS/discharge instruction with patient. Time allotted for and all questions answered. Patient is agreeable for d/c and escorted to ed exit by staff.  

## 2022-08-31 NOTE — ED Provider Notes (Cosign Needed Addendum)
[facility] DEPT Provider Note   CSN: [id number] Arrival date & time: 08/31/22  1133     History  Chief Complaint  Patient presents with   Urinary Frequency   Emesis   Nausea    [name] is a 56 y.o. female with history significant for CKD, type II DM, thyroid cancer presents to the ER complaining of generalized abdominal pain since Saturday.  Patient was seen in [facility] ED for increased urinary frequency, nausea, vomiting, and severe abdominal pain.  She was diagnosed with UTI and placed on 10 days of antibiotics.  She states her pain is not as severe, but has not been relieved with pain meds at home.  She is also been taking medication for nausea.  She states she has not had more episodes of vomiting, but has had decreased PO intake.  She has not had a bowel movement since Saturday, but states she has been passing a lot of gas.  Denies fever, chills, melena, hematochezia, coffee-ground emesis, changes in abdominal pain postprandial, dysuria, polydipsia, flank pain, hematuria.       Home Medications Prior to Admission medications   Medication Sig Start Date End Date Taking? Authorizing Provider  cefdinir (OMNICEF) 300 MG capsule Take 1 capsule (300 mg total) by mouth daily. 08/31/22  Yes Brennyn Ortlieb R, PA  oxyCODONE (ROXICODONE) 5 MG immediate release tablet Take 1 tablet (5 mg total) by mouth every 6 (six) hours as needed for severe pain. 08/31/22  Yes Lashara Urey R, PA  acetaminophen (TYLENOL) 500 MG tablet Take 1,000 mg by mouth every 6 (six) hours as needed for mild pain.    [provider]  albuterol (VENTOLIN HFA) 108 (90 Base) MCG/ACT inhaler Inhale 2 puffs into the lungs every 6 (six) hours as needed for wheezing or shortness of breath. 10/07/20   Hall-Potvin, [place], PA-C  allopurinol (ZYLOPRIM) 100 MG tablet TAKE 1 TABLET BY MOUTH EVERY DAY FOR GOUT PREVENTION 04/29/22   [name], NP  Blood Glucose Monitoring  Suppl (FREESTYLE LITE) w/Device KIT Inject 1 each into the skin daily. 11/10/20   Skeet Latch, MD  candesartan (ATACAND) 32 MG tablet TAKE 1 TABLET BY MOUTH DAILY. 06/28/22   Skeet Latch, MD  carvedilol (COREG) 25 MG tablet Take 1 tablet (25 mg total) by mouth 2 (two) times daily. 02/01/22   Alvstad, Casimiro Needle, RPH-CPP  cetirizine (ZYRTEC) 10 MG tablet Take 1 tablet by mouth at bedtime for allergies. 10/27/21   [name], NP  dolutegravir ([name]) 50 MG tablet Take 1 tablet (50 mg total) by mouth daily. 12/28/21   Michel Bickers, MD  emtricitabine-tenofovir AF (DESCOVY) 200-25 MG tablet Take 1 tablet by mouth daily. 12/28/21   Michel Bickers, MD  FLOVENT HFA 110 MCG/ACT inhaler TAKE 1 PUFF BY MOUTH TWICE A DAY Patient taking differently: Inhale 1 puff into the lungs 2 (two) times daily as needed (wheezing/shortness of breath). 08/27/19   [name], NP  fluticasone (FLONASE) 50 MCG/ACT nasal spray Place 2 sprays into both nostrils daily. 07/19/21   Michela Pitcher, NP  gabapentin (NEURONTIN) 300 MG capsule Take 300 mg by mouth daily.    [provider]  glipiZIDE (GLUCOTROL) 10 MG tablet TAKE 1 TABLET BY MOUTH TWICE A DAY FOR DIABETES 06/14/22   [name], NP  glucose blood (ONETOUCH VERIO) test strip USE AS INSTRUCTED TO TEST BLOOD SUGAR up to 3 times daily. 11/12/21   [name], NP  hydrALAZINE (  APRESOLINE) 50 MG tablet TAKE 1 TABLET (50 MG) BY MOUTH IN THE MORNING AND AT BEDTIME 08/22/22   Skeet Latch, MD  hydrOXYzine (ATARAX) 10 MG tablet Take 1 tablet (10 mg total) by mouth 2 (two) times daily as needed for itching. 11/26/21   Michela Pitcher, NP  Lancets [facility] ULTRASOFT) lancets Use as instructed to test blood sugar daily 11/25/20   [name], NP  levothyroxine (SYNTHROID) 175 MCG tablet Take 1 tablet (175 mcg total) by mouth daily. 03/01/22   Philemon Kingdom, MD  minocycline (DYNACIN) 100 MG tablet Take 1 tablet (100 mg total) by  mouth 2 (two) times daily. 06/09/22   Michel Bickers, MD  omeprazole (PRILOSEC) 40 MG capsule TAKE 1 CAPSULE BY MOUTH DAILY FOR REFLUX 11/11/21   [name], NP  ondansetron (ZOFRAN-ODT) 4 MG disintegrating tablet Take by mouth. Patient not taking: Reported on 08/29/2022 08/27/22 08/31/22  [provider]  promethazine (PHENERGAN) 12.5 MG tablet Take 1 tablet (12.5 mg total) by mouth every 8 (eight) hours as needed for nausea or vomiting. 08/29/22   Michela Pitcher, NP  rosuvastatin (CRESTOR) 10 MG tablet TAKE 1 TABLET BY MOUTH EVERY DAY FOR CHOLESTEROL 06/21/22   [name], NP  sertraline (ZOLOFT) 50 MG tablet TAKE 1 TABLET (50 MG TOTAL) BY MOUTH DAILY. FOR ANXIETY AND DEPRESSION. 11/11/21   [name], NP  spironolactone (ALDACTONE) 25 MG tablet Take 1 tablet (25 mg total) by mouth daily. 11/17/21   Skeet Latch, MD  tamoxifen (NOLVADEX) 20 MG tablet Take 1 tablet (20 mg total) by mouth daily. 07/27/21   Magrinat, Virgie Dad, MD      Allergies    Genvoya [elviteg-cobic-emtricit-tenofaf], Lisinopril, Tape, and Tegaderm ag mesh [silver]    Review of Systems   Review of Systems  Constitutional:  Positive for appetite change. Negative for chills and fever.  Gastrointestinal:  Positive for abdominal pain, constipation, nausea and vomiting. Negative for blood in stool and diarrhea.  Endocrine: Negative for polydipsia.  Genitourinary:  Positive for frequency. Negative for decreased urine volume, dysuria, flank pain and hematuria.  Skin:  Negative for color change.  Neurological:  Negative for weakness.    Physical Exam Updated Vital Signs BP (!) 207/96   Pulse 64   Temp 98.3 F (36.8 C) (Oral)   Resp 18   Ht _0  (1.651 m)   Wt 97.1 kg   LMP 03/31/2014 (LMP Unknown)   SpO2 93%   BMI 35.61 kg/m  Physical Exam Vitals and nursing note reviewed.  Constitutional:      General: She is not in acute distress.    Appearance: She is not ill-appearing or  toxic-appearing.  HENT:     Mouth/Throat:     Mouth: Mucous membranes are moist.     Pharynx: Oropharynx is clear.  Cardiovascular:     Rate and Rhythm: Normal rate and regular rhythm.     Pulses: Normal pulses.     Heart sounds: Normal heart sounds.  Pulmonary:     Effort: Pulmonary effort is normal. No respiratory distress.     Breath sounds: Normal breath sounds and air entry.  Abdominal:     General: Abdomen is flat. Bowel sounds are normal. There is no distension.     Palpations: Abdomen is soft.     Tenderness: There is generalized abdominal tenderness and tenderness in the right upper quadrant and epigastric area.     Comments: Generalized abdominal pain, increased tenderness to  palpation in epigastric and RUQ.  Skin:    General: Skin is warm and dry.     Capillary Refill: Capillary refill takes less than 2 seconds.  Neurological:     Mental Status: She is alert. Mental status is at baseline.  Psychiatric:        Mood and Affect: Mood normal.        Behavior: Behavior normal.     ED Results / Procedures / Treatments   Labs (all labs ordered are listed, but only abnormal results are displayed) Labs Reviewed  COMPREHENSIVE METABOLIC PANEL - Abnormal; Notable for the following components:      Result Value   Potassium 3.3 (*)    BUN 29 (*)    Creatinine, Ser 2.89 (*)    Albumin 3.4 (*)    GFR, Estimated 19 (*)    All other components within normal limits  CBC - Abnormal; Notable for the following components:   RBC 3.57 (*)    Hemoglobin 11.1 (*)    HCT 33.3 (*)    RDW 18.7 (*)    All other components within normal limits  URINALYSIS, ROUTINE W REFLEX MICROSCOPIC - Abnormal; Notable for the following components:   Hgb urine dipstick TRACE (*)    Protein, ur >300 (*)    Leukocytes,Ua MODERATE (*)    Bacteria, UA RARE (*)    All other components within normal limits  LIPASE, BLOOD    EKG None  Radiology CT ABDOMEN PELVIS WO CONTRAST  Result Date:  08/31/2022 CLINICAL DATA:  Abdominal pain lower back pain increased urinary frequency common nausea and vomiting. EXAM: CT ABDOMEN AND PELVIS WITHOUT CONTRAST TECHNIQUE: Multidetector CT imaging of the abdomen and pelvis was performed following the standard protocol without IV contrast. RADIATION DOSE REDUCTION: This exam was performed according to the departmental dose-optimization program which includes automated exposure control, adjustment of the mA and/or kV according to patient size and/or use of iterative reconstruction technique. COMPARISON:  MRI June 25, 2022 and CT October 22, 2021. FINDINGS: Lower chest: Small bilateral pleural effusions with adjacent atelectasis. Skin thickening over the right breast. Mild symmetric wall thickening of the distal esophagus. Hepatobiliary: Hepatomegaly with hepatic steatosis. Cholelithiasis without cholecystitis. Soft tissue nodularity in the gallbladder fundus previously characterized as adenomyomatosis on MRI June 25, 2022 no biliary ductal dilation. Pancreas: No pancreatic ductal dilation or evidence of acute inflammation. Spleen: No splenomegaly. Adrenals/Urinary Tract: Similar left-greater-than-right lobular adrenal thickening favor hyperplasia considered benign requiring no imaging follow-up. Right adrenal gland appears normal. No hydronephrosis. Similar right cortical thinning with calyceal prominence. Nonobstructive right renal stones measure up to 6 mm. No left-sided hydronephrosis or nephrolithiasis. Urinary bladder is minimally distended limiting evaluation. Stomach/Bowel: Stomach is unremarkable for degree of distension. No pathologic dilation of small or large bowel. Appendix is not confidently identified however there is no pericecal inflammation. No evidence of acute bowel inflammation. Vascular/Lymphatic: Aortic atherosclerosis. No pathologically enlarged abdominal or pelvic lymph nodes. Reproductive: Uterus and bilateral adnexa are unremarkable.  Other: Postoperative change in the anterior abdominal wall similar prior. Musculoskeletal: No acute osseous abnormality. Mild multilevel degenerative changes spine. IMPRESSION: 1. No acute abnormality in the abdomen or pelvis. 2. Small bilateral pleural effusions with adjacent atelectasis. 3. Distal esophageal wall thickening may reflect sequela of emesis or esophagitis. 4. Hepatomegaly with hepatic steatosis. 5. Cholelithiasis without cholecystitis. 6. Nonobstructive right renal stones measure up to 6 mm. 7. Skin thickening over the right breast, nonspecific suggest correlation with direct visualization. 8.  Aortic  Atherosclerosis (ICD10-I70.0). Electronically Signed   By: Dahlia Bailiff M.D.   On: 08/31/2022 13:07    Procedures Procedures    Medications Ordered in ED Medications  carvedilol (COREG) tablet 25 mg (has no administration in time range)  hydrALAZINE (APRESOLINE) tablet 50 mg (has no administration in time range)  sodium chloride 0.9 % bolus 1,000 mL (0 mLs Intravenous Stopped 08/31/22 1425)  cefTRIAXone (ROCEPHIN) 1 g in sodium chloride 0.9 % 100 mL IVPB (1 g Intravenous New Bag/Given 08/31/22 1544)  acetaminophen (TYLENOL) tablet 1,000 mg (1,000 mg Oral Given 08/31/22 1547)    ED Course/ Medical Decision Making/ A&P Clinical Course as of 08/31/22 1708  Wed Aug 31, 2022  1233 Stable 56 YOF with AOC abdominal pain. Started on ABX recently by UC in [place]. Negative culture. Hx of gallstones. CT noncon and reassess.  [CC]  1532 Evaluated at bedside.  She is continuing to have left-sided flank pain, intermittent nausea, sharp pain.  She refused pain medication or any further treatment in the emergency department.  Review of her labs reveals moderate leukocytes in her urine.  She also continues to have bacteriuria despite being on day 5 of ciprofloxacin. [CC]    Clinical Course User Index [CC] Tretha Sciara, MD                           Medical Decision Making Amount and/or  Complexity of Data Reviewed Labs: ordered. Radiology: ordered.  Risk OTC drugs. Prescription drug management.   This patient presents to the ED with chief complaint(s) of nausea, vomiting, and abdominal pain with pertinent past medical history of type 2 DM, pancreatitis, thyroid cancer, CKD .The complaint involves an extensive differential diagnosis and also carries with it a high risk of complications and morbidity.    The differential diagnosis includes pancreatitis, bowel obstruction, gastroenteritis, cholecystitis, kidney stone   The initial plan is to obtain baseline labs including CBC, CMP, lipase and obtain CT abdomen/pelvis wo contrast  Additional history obtained: Records reviewed Primary Care Documents  Initial Assessment:   On exam, patient appears to be resting comfortably in bed and is nontoxic.  Abdomen is generally tender to palpation, with increased tenderness to the epigastric and right upper quadrant region.  Bowel sounds are normal.  Heart rate and rhythm normal, lung clear to auscultation bilaterally.  Skin is warm and dry with normal capillary refill.  Independent ECG/labs interpretation:  The following labs were independently interpreted:  CBC significant for anemia, improved since last results, of chronic disease.  Metabolic panel significant for mild hypokalemia, elevated creatinine at 2.89, improved from 3.01 from 2 days prior.  UA significant for pyuria, rare bacteria, elevated protein; negative for nitrite, pyuria may be a result of nephrolithiasis; urine culture from a few days ago was negative for bacteria growth.   Independent visualization and interpretation of imaging: I independently visualized the following imaging with scope of interpretation limited to determining acute life threatening conditions related to emergency care: CT abdomen pelvis, which revealed cholelithiasis without cholecystitis, nonobstructive right renal stones measuring up to 6 mm,  evidence of possible esophagitis, hepatomegaly with hepatic steatosis, and small bilateral pleural effusions with atelectasis.  Treatment and Reassessment: Plan to treat patient with fluids due to suspected dehydration from vomiting.  Will reassess patient following fluid bolus.  Patient states her pain remains unchanged, but does not want any additional pain medication.  Will give her Tylenol.  Based on UA and suspicion for  pyelonephritis, will treat with IV Rocephin and discharge her on cefdinir, discontinue ciprofloxacin.  Patient blood pressure trending upward while here into 694H systolic, she is asymptomatic.  Will give her Coreg and hydralazine as she is prescribed at home and recheck BP.  Patient did not want to wait in ER for recheck of blood pressure, but did agree to take medications.  Advised patient we would like to reevaluate her blood pressure to ensure that it is trending downward because it is very high, she is adamant on leaving at this time and states her blood pressure is high due to pain.  Disposition:   Based on urinalysis still demonstrating leukocytes and bacteriuria despite 5 days of ciprofloxacin, suspicious for pyelonephritis.  CT did show nonobstructing nephrolithiasis.  Patient does not wish to have any further treatment in emergency department, does not want any pain medication.  Her vital signs are stable, no evidence of leukocytosis, pancreatitis, or severe metabolic disturbance.  She has elevated creatinine which is chronic for her given her CKD.  Patient was given IV Rocephin while in the ED.  Will discontinue patient's ciprofloxacin, and change to oral cefdinir.  Discussed with patient change of antibiotics and stressed importance of follow-up with PCP.  Also discussed with patient use of MiraLAX or Senokot to help with constipation likely secondary to opiate use.  No evidence of increased stool burden or obstruction on CT.  Patient agrees with plan of discharge.  Return  precautions given.    Discussed HPI, physical exam findings, assessment and plan with attending [facility].  The attending physician assessed patient independently as part of a shared visit and recommended IV Rocephin and discharging patient on oral cefdinir, discontinuing ciprofloxacin.          Final Clinical Impression(s) / ED Diagnoses Final diagnoses:  Renal colic  Pyuria    Rx / DC Orders ED Discharge Orders          Ordered    cefdinir (OMNICEF) 300 MG capsule  Daily        08/31/22 1552    oxyCODONE (ROXICODONE) 5 MG immediate release tablet  Every 6 hours PRN        08/31/22 1707              Pat Kocher, PA 08/31/22 1642    Theressa Stamps R, [place] 08/31/22 1708    Tretha Sciara, MD 09/01/22 [phone]

## 2022-08-31 NOTE — ED Notes (Signed)
Patient agreeable to take BP medication, does not wish to wait for BP eval. Provider notified

## 2022-08-31 NOTE — Telephone Encounter (Signed)
Patient advised. Patient states she has been hurting severe since 08/27/22, she is on the way to ER, thinks will go to [facility]. She does not know what's going on but she does not want to sit in the house and wait for something "to take her out." Told patient I was sorry she was hurting still and hope that they can find some answers for the patient and that we would get notes from them. Fyi to [facility] and PCP

## 2022-08-31 NOTE — Discharge Instructions (Addendum)
You were seen ER for abdominal pain and we suspect you may have something called pyelonephritis, an infection in your kidney.  We are changing your antibiotic from ciprofloxacin to cefdinir.  Please follow-up with your PCP as soon as possible for reevaluation of your symptoms.  Please use MiraLAX or Senokot for constipation.  Return to the ER if you develop new or worsening symptoms.

## 2022-08-31 NOTE — Telephone Encounter (Signed)
-----   Message from [place] sent at 08/30/2022 12:11 PM EST ----- Patient advised. Patient states the pain is still there and not as intense with taking pain medication. No vomiting. She is taking nausea medication and that is better. She has not had a bowel movement since around 08/27/22 and usually has bowel movement early in the morning and sometimes in the afternoon. What should she take for the bowels and for how long? She is agreeable to [place] location please-advised we will send it to [place]

## 2022-09-01 ENCOUNTER — Encounter: Payer: Commercial Managed Care - HMO | Admitting: Physical Therapy

## 2022-09-06 ENCOUNTER — Encounter: Payer: Commercial Managed Care - HMO | Admitting: Physical Therapy

## 2022-09-13 ENCOUNTER — Encounter: Payer: Self-pay | Admitting: Internal Medicine

## 2022-09-14 ENCOUNTER — Ambulatory Visit (INDEPENDENT_AMBULATORY_CARE_PROVIDER_SITE_OTHER): Payer: Commercial Managed Care - HMO | Admitting: Physical Therapy

## 2022-09-14 ENCOUNTER — Encounter: Payer: Self-pay | Admitting: Physical Therapy

## 2022-09-14 DIAGNOSIS — M6281 Muscle weakness (generalized): Secondary | ICD-10-CM

## 2022-09-14 DIAGNOSIS — M25562 Pain in left knee: Secondary | ICD-10-CM | POA: Diagnosis not present

## 2022-09-14 DIAGNOSIS — R262 Difficulty in walking, not elsewhere classified: Secondary | ICD-10-CM | POA: Diagnosis not present

## 2022-09-14 DIAGNOSIS — M25561 Pain in right knee: Secondary | ICD-10-CM | POA: Diagnosis not present

## 2022-09-14 DIAGNOSIS — M5459 Other low back pain: Secondary | ICD-10-CM

## 2022-09-14 DIAGNOSIS — G8929 Other chronic pain: Secondary | ICD-10-CM

## 2022-09-14 NOTE — Therapy (Addendum)
OUTPATIENT PHYSICAL THERAPY LOWER EXTREMITY TREATMENT /DISCHARGE   Patient Name: [name] MRN: [id number] DOB:12-06-1965, 56 y.o., female Today's Date: 09/14/2022   PT End of Session - 09/14/22 1144     Visit Number 3    Number of Visits 12    Date for PT Re-Evaluation 10/06/22    Authorization Type [employer]    PT Start Time 6767    PT Stop Time 1225    PT Time Calculation (min) 40 min    Activity Tolerance Patient tolerated treatment well    Behavior During Therapy [facility] for tasks assessed/performed              Past Medical History:  Diagnosis Date   Allergy    Anemia    Normocytic   Antibiotic-induced yeast infection 09/28/2020   Anxiety    Asthma    Blood dyscrasia    Bronchitis 2005   Bursitis of left shoulder 09/06/2019   CKD (chronic kidney disease)    CLASS 1-EXOPHTHALMOS-THYROTOXIC 02/08/2007   COVID-19 virus infection 04/28/2022   Diabetes mellitus without complication ([place])    Family history of breast cancer    Family history of lung cancer    Family history of prostate cancer    Gastroenteritis 07/10/07   Genetic testing 07/25/2018   [employer] + RNA Insight was ordered.  Genes Analyzed (43 total): APC*, ATM*, AXIN2, BARD1, BMPR1A, BRCA1*, BRCA2*, BRIP1*, CDH1*, CDK4, CDKN2A, CHEK2*, DICER1, GALNT12, HOXB13, MEN1, MLH1*, MRE11A, MSH2*, MSH3, MSH6*, MUTYH*, NBN, NF1*, NTHL1, PALB2*, PMS2*, POLD1, POLE, PTEN*, RAD50, RAD51C*, RAD51D*, RET, SDHB, SDHD, SMAD4, SMARCA4, STK11 and TP53* (sequencing and deletion/duplication); EGFR (s   GERD 07/24/2006   GRAVE'S DISEASE 01/01/2008   History of hidradenitis suppurativa    History of kidney stones    History of thrush    HIV DISEASE 07/24/2006   dx March 05   Hyperlipidemia    HYPERTENSION 07/24/2006   Hyperthyroidism 08/2006   Grave's Disease -diffuse radiotracer uptake 08/25/06 Thyroid scan-Cold nodule to R lower lobe of thyrorid   Menometrorrhagia    hx of   Nephrolithiasis    Panniculitis 05/12/2020    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 Completed 2 yrs Prednisone after lung bx confirmation. No symptoms since then.   Suppurative hidradenitis    Thyroid cancer ([place])    THYROID NODULE, RIGHT 02/08/2007   Past Surgical History:  Procedure Laterality Date   APPLICATION OF WOUND VAC N/A 01/20/2021   Procedure: APPLICATION OF WOUND VAC;  Surgeon: Wallace Going, DO;  Location: [place];  Service: Plastics;  Laterality: N/A;   BREAST EXCISIONAL BIOPSY Right 04/26/2018   right axilla negative   BREAST EXCISIONAL BIOPSY Left 04/26/2018   left axilla negative   BREAST LUMPECTOMY Right 10/03/2018   malignant   BREAST LUMPECTOMY WITH RADIOACTIVE SEED AND SENTINEL LYMPH NODE BIOPSY Right 10/03/2018   Procedure: RIGHT BREAST LUMPECTOMY WITH RADIOACTIVE SEED AND SENTINEL LYMPH NODE MAPPING;  Surgeon: Erroll Luna, MD;  Location: [place];  Service: General;  Laterality: Right;   BREAST SURGERY  1997   Breast Reduction    CYSTOSCOPY W/ URETERAL STENT REMOVAL  11/09/2012   Procedure: CYSTOSCOPY WITH STENT REMOVAL;  Surgeon: Alexis Frock, MD;  Location: [facility];  Service: Urology;  Laterality: Right;   CYSTOSCOPY WITH RETROGRADE PYELOGRAM, URETEROSCOPY AND STENT PLACEMENT  11/09/2012   Procedure: CYSTOSCOPY WITH RETROGRADE PYELOGRAM, URETEROSCOPY AND STENT PLACEMENT;  Surgeon: Alexis Frock, MD;  Location: [facility];  Service: Urology;  Laterality: Left;  LEFT URETEROSCOPY, STONE MANIPULATION, left STENT exchange    CYSTOSCOPY WITH STENT PLACEMENT   10/02/2012   Procedure: CYSTOSCOPY WITH STENT PLACEMENT;  Surgeon: Alexis Frock, MD;  Location: [facility];  Service: Urology;  Laterality: Left;   DEBRIDEMENT AND CLOSURE WOUND N/A 01/20/2021   Procedure: Excision of abdominal wound with closure;  Surgeon: Wallace Going, DO;  Location: [place];  Service: Plastics;  Laterality: N/A;   DILATION AND CURETTAGE OF UTERUS  Feb 2004   s/p for 1st trimester nonviable pregnancy   EYE SURGERY     sty under eyelid   INCISE AND DRAIN ABCESS  Nov 03   s/p I &D for righ inframmary fold hidradenitis   INCISION AND DRAINAGE PERITONSILLAR ABSCESS  Mar 03   IR CV LINE INJECTION  06/07/2018   IR IMAGING GUIDED PORT INSERTION  06/20/2018   IR REMOVAL TUN ACCESS W/ PORT W/O FL MOD SED  06/20/2018   IRRIGATION AND DEBRIDEMENT ABSCESS  01/31/2012   Procedure: IRRIGATION AND DEBRIDEMENT ABSCESS;  Surgeon: Shann Medal, MD;  Location[facility];  Service: General;  Laterality: Right;  right breast and axilla    NEPHROLITHOTOMY  10/02/2012   Procedure: NEPHROLITHOTOMY PERCUTANEOUS;  Surgeon: Alexis Frock, MD;  Location[facility];  Service: Urology;  Laterality: Right;  First Stage Percutaneous Nephrolithotomy with Surgeon Access, Left Ureteral Stent     NEPHROLITHOTOMY  10/04/2012   Procedure: NEPHROLITHOTOMY PERCUTANEOUS SECOND LOOK;  Surgeon: Alexis Frock, MD;  [facility];  Service: Urology;  Laterality: Right;      NEPHROLITHOTOMY  10/08/2012   Procedure: NEPHROLITHOTOMY PERCUTANEOUS;  Surgeon: Alexis Frock, MD;  [facility];  Service: Urology;  Laterality: Right;  THIRD STAGE, nephrostomy tube exchange x 2   NEPHROLITHOTOMY  10/11/2012   Procedure: NEPHROLITHOTOMY PERCUTANEOUS SECOND LOOK;  Surgeon: Alexis Frock, MD;  [facility];  Service: Urology;  Laterality: Right;  RIGHT 4 STAGE PERCUTANOUS NEPHROLITHOTOMY, right URETEROSCOPY WITH HOLMIUM LASER    PANNICULECTOMY N/A 12/21/2020   Procedure: PANNICULECTOMY;  Surgeon:  Wallace Going, DO;  Location: [place];  Service: Plastics;  Laterality: N/A;   PORT-A-CATH REMOVAL N/A 07/16/2020   Procedure: REMOVAL PORT-A-CATH;  Surgeon: Erroll Luna, MD;  Location: [place];  Service: General;  Laterality: N/A;   PORTACATH PLACEMENT Left 05/17/2018   Procedure: INSERTION PORT-A-CATH;  Surgeon: Coralie Keens, MD;  Location: [place];  Service: General;  Laterality: Left;   RADICAL NECK DISSECTION  04/12/2017   limited/notes 04/12/2017   RADICAL NECK DISSECTION N/A 04/12/2017   Procedure: RADICAL NECK DISSECTION;  Surgeon: Melida Quitter, MD;  Location: [place];  Service: ENT;  Laterality: N/A;  limited neck dissection 2 hours total   REDUCTION MAMMAPLASTY Bilateral 1998   RIGHT/LEFT HEART CATH AND CORONARY ANGIOGRAPHY N/A 03/12/2020   Procedure: RIGHT/LEFT HEART CATH AND CORONARY ANGIOGRAPHY;  Surgeon: Jolaine Artist, MD;  Location: [place] [facility];  Service: Cardiovascular;  Laterality: N/A;   [place]  04/12/2017   completion/notes  04/12/2017   THYROIDECTOMY N/A 04/12/2017   Procedure: THYROIDECTOMY;  Surgeon: Melida Quitter, MD;  Location: [place];  Service: ENT;  Laterality: N/A;  Completion Thyroidectomy   TOTAL THYROIDECTOMY  2010   Patient Active Problem List   Diagnosis Date Noted   Acute nonintractable headache 08/29/2022   Bilateral low back pain without sciatica 08/29/2022   Right upper quadrant abdominal pain 08/29/2022   Nausea 08/29/2022   Abnormal urine finding 08/29/2022   Chronic pain of both knees 07/29/2022   Pruritus 11/26/2021   Chronic illness 10/27/2021   Acute renal failure superimposed on stage 4 chronic kidney disease ([place]) 10/24/2021   Acute pancreatitis 10/23/2021   Lymphadenopathy 07/01/2021   Paresthesias 06/30/2021   Chronic pain of right hand 06/30/2021   Chronic midline low back pain with right-sided sciatica 06/30/2021   COVID-19 long hauler 05/11/2021   Voice  hoarseness 12/15/2020   Recurrent falls 11/05/2020   Impingement syndrome of left shoulder region 05/27/2020   Inclusion cyst 05/01/2020   Sebaceous cyst 04/21/2020   Postoperative breast asymmetry 02/28/2020   Cognitive dysfunction 02/21/2020   Chronic gout 01/27/2020   Hematuria 12/10/2019   Neuropathy due to chemotherapeutic drug ([place]) 12/10/2019   Pain in joint of left shoulder 09/05/2019   Dysphagia 07/23/2019   CKD stage 3 due to type 2 diabetes mellitus ([place]) 07/12/2019   Normocytic anemia 07/12/2019   Preventative health care 09/10/2018   Port-A-Cath in place 07/12/2018   Family history of breast cancer    Family history of prostate cancer    Family history of lung cancer    Malignant neoplasm of lower-inner quadrant of right breast of female, estrogen receptor positive ([place]) 05/10/2018   Thyroid cancer ([place])    Hyperlipidemia 04/26/2017   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[place]) 04/12/2017   GAD (generalized anxiety disorder) 04/11/2017   Mass of left side of neck 11/24/2016   Laryngopharyngeal reflux (LPR) 11/24/2016   Upper airway cough syndrome 11/11/2016   Obesity (BMI 30-39.9) 10/14/2016   Allergic rhinitis 02/18/2016   Non-insulin dependent type 2 diabetes mellitus ([place]) 06/23/2015   Renal calculi 10/29/2014   Recurrent boils 06/11/2014   Rash 02/16/2012   Hidradenitis suppurativa 01/30/2012   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   Sarcoidosis 02/08/2007   Cigarette smoker 02/08/2007   HIV disease ([place]) 07/24/2006   Depression 07/24/2006   Essential hypertension 07/24/2006   Asthma 07/24/2006    PCP: [name], NP  REFERRING PROVIDER: [name], NP  REFERRING DIAG: M25.561,M25.562,G89.29 (ICD-10-CM) - Chronic pain of both knees  THERAPY DIAG:  Chronic pain of right knee  Chronic pain of left knee  Muscle weakness (generalized)  Difficulty in walking, not elsewhere classified  Other low back pain  Rationale for Evaluation  and Treatment Rehabilitation  ONSET DATE: Chronic knee pain for 3 years  SUBJECTIVE:   SUBJECTIVE STATEMENT: She relays a lot of pain lately, she has been having other medical issues going on and has been to ER 2 times since her last PT session. She had kidney stones and UTI and will continue to follow up with MD about this. Due to her pain she requests a shorter session today  PERTINENT HISTORY: Asthma, chronic stage 4 kidney disease, type 2 diabetes, Grave's disease, history of kidney stones, HIV, HLD, HTN, thyroid  PAIN:  Are you having pain? Yes: NPRS scale: 4-5/10 Pain location: back>knees  Pain description: dull, nothing sharp  Aggravating factors: "not sure that's a good question"  Relieving factors: movement  PRECAUTIONS: None  WEIGHT BEARING RESTRICTIONS: No  FALLS:  Has patient fallen in last 6 months? No  LIVING ENVIRONMENT:  Stairs: Yes has hand rails and feels like she has to pull herself up   OCCUPATION: none  PLOF: Independent  PATIENT GOALS: Improve pain   OBJECTIVE:   DIAGNOSTIC FINDINGS: Recent bilateral knee Xrays were negative   PATIENT SURVEYS:  Eval: FOTO 31% functional, goal is 48%  COGNITION: Overall cognitive status: Within functional limits for tasks assessed     SENSATION: WFL  EDEMA:    MUSCLE LENGTH: Hamstrings: tight bilat Thomas test:tight bilat  POSTURE:   PALPATION:   LOWER EXTREMITY ROM:  Active ROM Right eval Left eval  Hip flexion    Hip extension    Hip abduction    Hip adduction    Hip internal rotation    Hip external rotation    Knee flexion 120 125  Knee extension 0 0  Ankle dorsiflexion    Ankle plantarflexion    Ankle inversion    Ankle eversion     (Blank rows = not tested)  Lumbar AROM  Eval Flexion 50% Extension 25% Rotation 25% bilat  LOWER EXTREMITY MMT:  MMT tested in sitting Right eval Left eval  Hip flexion 3 3  Hip extension    Hip abduction 4 4  Hip adduction    Hip  internal rotation    Hip external rotation    Knee flexion 4 4  Knee extension 4 4  Ankle dorsiflexion    Ankle plantarflexion    Ankle inversion    Ankle eversion     (Blank rows = not tested)  LOWER EXTREMITY SPECIAL TESTS:  + SLR test bilat  FUNCTIONAL TESTS:  Eval: 5 times sit to stand: 25.16 must use UE support to push up  GAIT: Comments: independent Ambulator, limited by pain at times with slower velocity   TODAY'S TREATMENT:   09/14/22  -Nu step L4 X 5 min UE/LE  -Seated SLR 2X 5 bilat  -Seated LAQ 2# 2X5 bilat  -Standing hip abd 2# 2X5 bilat  -Standing hamstring curl 2# 2X5 bilat  -Supine LTR 5 sec X 5 bilat  -Supine SKTC stretch 10 sec X 3 bilat  -Supine bridges X 10  -Supine clams red X 10 bilat  -Supine hamstring stretch 3 X 20 sec with strap   08/17/22  TherEx  Nustep L4 x6 minutes BLEs only Bridge with red TB/hip ABD x10  Sidelying clams red TB x10 B very weak, needed cues for control PPT x12 with 3 second holds  PPT + march x10 B  STS x10 with red TB above knees, eccentric lower increased pain lateral L patella/distal lateral thigh L LE ITB stretch 1x45 seconds  Practiced correct technique for bed mobility to protect low back   Manual:  Tennis ball STM L lateral thigh to tolerance    Education  Exercise form/purpose, progressions today, HEP review as needed/appropriate    Eval HEP creation and review, see below for details Moist heat to low back and knees   PATIENT EDUCATION: Education details: see above  Person educated: Patient Education method: Consulting civil engineer, Media planner, Verbal cues, and Handouts Education comprehension: verbalized understanding and needs further education   HOME EXERCISE PROGRAM: Access Code: [email or web address] URL: [name] Date: 08/11/2022 Prepared by: Elsie Ra  Exercises - Seated Knee Flexion AAROM  - 2 x daily - 6 x weekly - 2 sets - 10 reps - 5 hold - Seated Knee Extension with  Resistance  - 2 x daily - 6 x weekly - 2 sets - 10 reps - Sit to Stand with Armchair  - 2 x daily - 6 x weekly - 1-2 sets - 5 reps - Seated Lumbar Flexion Stretch  - 2 x daily - 6 x weekly - 1 sets - 10 reps - 10 hold - Marching with Resistance  - 1 x daily - 6 x weekly - 1-2 sets - 10 reps - Standing Lumbar Extension  - 2 x daily - 6 x weekly - 1-2 sets - 10 reps - 5 hold  ASSESSMENT:  CLINICAL IMPRESSION: Session limited by pain and she has been having other medical complications also limiting her progress in PT thus far.    OBJECTIVE IMPAIRMENTS: decreased activity tolerance, difficulty walking, decreased balance, decreased endurance, decreased mobility, decreased ROM, decreased strength, impaired flexibility, impaired UE/LE use, postural dysfunction, and pain.  ACTIVITY LIMITATIONS: bending, lifting, carry, locomotion, cleaning, community activity, driving, and or occupation  PERSONAL FACTORS: Asthma, chronic stage 4 kidney disease, type 2 diabetes, Grave's disease, history of kidney stones, HIV, HLD, HTN, thyroid  are also affecting patient's functional outcome.  REHAB POTENTIAL: Good  CLINICAL DECISION MAKING: Stable/uncomplicated  EVALUATION COMPLEXITY: Low    GOALS: Short term PT Goals Target date: 09/08/2022 Pt will be I and compliant with HEP. Baseline:  Goal status: New Pt will decrease pain by 25% overall (less than  Baseline: Goal status: New  Long term PT goals Target date: 10/06/2022 Pt will improve  hip/knee strength to at least 4+/5 MMT to improve functional strength Baseline: Goal status: New Pt will improve FOTO to at least 48% functional to show improved function Baseline: Goal status: New Pt will reduce pain by overall 50% (less than 4/10) overall with usual activity including sit to stand, stairs, and ambulation Baseline:8 Goal status: New  PLAN: PT FREQUENCY: 1-2 times per week   PT DURATION: 8 weeks  PLANNED INTERVENTIONS (unless  contraindicated): aquatic PT, Canalith repositioning, cryotherapy, Electrical stimulation, Iontophoresis with 4 mg/ml dexamethasome, Moist heat, traction, Ultrasound, gait training, Therapeutic exercise, balance training, neuromuscular re-education, patient/family education, prosthetic training, manual techniques, passive ROM, dry needling, taping, vasopnuematic device, vestibular, spinal manipulations, joint manipulations  PLAN FOR NEXT SESSION: leg strength and lumbar mobility as tolerated with gradual progressions to improve function.   Elsie Ra, PT, DPT 09/14/22 12:10 PM  PHYSICAL THERAPY DISCHARGE SUMMARY  Visits from Start of Care: 3  Current functional level related to goals / functional outcomes: See note   Remaining deficits: See note   Education / Equipment: HEP  Patient goals were partially met. Patient is being discharged due to not returning since the last visit.  Scot Jun, PT, DPT, OCS, ATC 10/27/22  10:32 AM

## 2022-09-15 ENCOUNTER — Encounter: Payer: Self-pay | Admitting: Primary Care

## 2022-09-15 ENCOUNTER — Ambulatory Visit (INDEPENDENT_AMBULATORY_CARE_PROVIDER_SITE_OTHER): Payer: Commercial Managed Care - HMO | Admitting: Primary Care

## 2022-09-15 VITALS — BP 158/92 | HR 75 | Temp 98.1°F | Ht 65.0 in | Wt 214.4 lb

## 2022-09-15 DIAGNOSIS — I10 Essential (primary) hypertension: Secondary | ICD-10-CM

## 2022-09-15 DIAGNOSIS — G8929 Other chronic pain: Secondary | ICD-10-CM

## 2022-09-15 DIAGNOSIS — J4530 Mild persistent asthma, uncomplicated: Secondary | ICD-10-CM

## 2022-09-15 DIAGNOSIS — N184 Chronic kidney disease, stage 4 (severe): Secondary | ICD-10-CM | POA: Diagnosis not present

## 2022-09-15 DIAGNOSIS — R1310 Dysphagia, unspecified: Secondary | ICD-10-CM

## 2022-09-15 DIAGNOSIS — M545 Low back pain, unspecified: Secondary | ICD-10-CM

## 2022-09-15 LAB — BASIC METABOLIC PANEL
BUN: 32 mg/dL — ABNORMAL HIGH (ref 6–23)
CO2: 32 mEq/L (ref 19–32)
Calcium: 8.9 mg/dL (ref 8.4–10.5)
Chloride: 101 mEq/L (ref 96–112)
Creatinine, Ser: 3.1 mg/dL — ABNORMAL HIGH (ref 0.40–1.20)
GFR: 16.45 mL/min — ABNORMAL LOW (ref >60.00)
Glucose, Bld: 141 mg/dL — ABNORMAL HIGH (ref 70–99)
Potassium: 4 mEq/L (ref 3.5–5.1)
Sodium: 138 mEq/L (ref 135–145)

## 2022-09-15 NOTE — Assessment & Plan Note (Signed)
Suspect her recent symptoms to be MSK related rather than UTIs, especially since urine culture was negative and symptoms improved significantly with low-dose ibuprofen.  Continue physical therapy.  Continue stretching.  Avoid recurrent use of NSAID medications given CKD.

## 2022-09-15 NOTE — Assessment & Plan Note (Signed)
Repeat renal function pending.  She is aware to avoid recurrent use of NSAID medications. We may need to initiate daily oral potassium, repeat potassium level pending.

## 2022-09-15 NOTE — Assessment & Plan Note (Signed)
Uncontrolled, has not taken meds today.  Recommended she closely monitor BP at home and report if readings are at or above 140/90.  Continue candesartan 32 milligrams daily, carvedilol 25 mg twice daily, hydralazine 50 mg twice daily, spironolactone 25 mg daily.
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
## 2022-09-15 NOTE — Patient Instructions (Signed)
Stop by the lab prior to leaving today. I will notify you of your results once received.   Keep an eye on your blood pressure.  Resume fluticasone (Flovent) inhaler twice daily for cough/congestion.  Resume your Zyrtec daily.  It was a pleasure to see you today!

## 2022-09-15 NOTE — Assessment & Plan Note (Addendum)
Returned and progressing.  Referral placed to GI for further evaluation.  Continue omeprazole 40 mg daily.

## 2022-09-15 NOTE — Assessment & Plan Note (Signed)
Resume Flovent 110 mcg, 1-2 puffs BID. Resume Zyrtec 10 mg daily.

## 2022-09-16 ENCOUNTER — Other Ambulatory Visit: Payer: Self-pay | Admitting: Primary Care

## 2022-09-16 DIAGNOSIS — N184 Chronic kidney disease, stage 4 (severe): Secondary | ICD-10-CM

## 2022-09-20 ENCOUNTER — Other Ambulatory Visit: Payer: Self-pay | Admitting: Cardiovascular Disease

## 2022-09-20 ENCOUNTER — Other Ambulatory Visit: Payer: Self-pay | Admitting: Primary Care

## 2022-09-20 DIAGNOSIS — E785 Hyperlipidemia, unspecified: Secondary | ICD-10-CM

## 2022-09-20 NOTE — Telephone Encounter (Signed)
Rx request sent to pharmacy.  

## 2022-09-23 ENCOUNTER — Other Ambulatory Visit (INDEPENDENT_AMBULATORY_CARE_PROVIDER_SITE_OTHER): Payer: Commercial Managed Care - HMO

## 2022-09-23 DIAGNOSIS — N184 Chronic kidney disease, stage 4 (severe): Secondary | ICD-10-CM | POA: Diagnosis not present

## 2022-09-23 LAB — BASIC METABOLIC PANEL
BUN: 32 mg/dL — ABNORMAL HIGH (ref 6–23)
CO2: 32 mEq/L (ref 19–32)
Calcium: 8.9 mg/dL (ref 8.4–10.5)
Chloride: 102 mEq/L (ref 96–112)
Creatinine, Ser: 2.86 mg/dL — ABNORMAL HIGH (ref 0.40–1.20)
GFR: 18.12 mL/min — ABNORMAL LOW (ref >60.00)
Glucose, Bld: 120 mg/dL — ABNORMAL HIGH (ref 70–99)
Potassium: 3.6 mEq/L (ref 3.5–5.1)
Sodium: 141 mEq/L (ref 135–145)

## 2022-10-04 NOTE — Progress Notes (Incomplete)
Hypertension Clinic Initial Assessment:    Date:  10/04/2022   ID:  [name], DOB 1966/05/07, MRN [id number]  PCP:  [name], NP  Cardiologist:  None  Nephrologist:  Referring MD: [name], NP   CC: Hypertension  History of Present Illness:    [name] is a 56 y.o. female with a hx of hypertension, hyperlipidemia, coronary calcification with no CAD on cath, diabetes, Graves' disease, asthma, sarcoidosis, tobacco abuse, HIV here to establish care in the hypertension clinic.  She was diagnosed with hypertension in around 2009.  He previously was well-controlled but she is struggled more lately.  [name] sees Dr. Haroldine Laws for cardio-oncology and EF monitoring.  Her breast cancer was diagnosed in 04/2018.  She underwent chemotherapy with carboplatin, docetaxel, trastuzumab, and Pertuzumab.  She only had 2 cycles of Pertuzumab due to diarrhea.  She continued on 6 months of trastuzumab and had reduction in her LVEF, though thought to be clinically stable per Dr. Haroldine Laws.  She had a lumpectomy 09/2018 and completed XRT on 02/2019.  She finished Herceptin on 10/2019.  She saw Dr. Haroldine Laws 06/2019 with increased exertional dyspnea thought to be due to volume overload.  She was started on spironolactone and [place].  She was unable to afford the [place].  She last saw Dr. Haroldine Laws on 06/2020 and her blood pressure was poorly controlled.  She was referred for sleep study and to the advanced hypertension clinic.  She was diagnosed with sleep apnea and is awaiting her titration study.  She notes that she does not get much exercise.  She has been very fatigued and sleeps a lot.  She recently started on treatment for her hypothyroidism.  TSH has been as high as 17 but is down to 6.4 when checked 3 weeks ago.  She has some shortness of breath when going up and down steps but denies any chest pain or pressure.  She did undergo cardiac catheterization 02/2020 that  revealed normal coronaries.  She denies any lower extremity edema, orthopnea, or PND.  She mostly cooks at home and does not use any salt.  She has no caffeine in her diet and does not use NSAIDs.  She started on prednisone last night for a gout attack.  She also notes that she had a surprise party for completing her chemotherapy.  Since then she has been eating a lot of barbecue.  She has not yet taken her medications this morning because she is fasting.  Lately at home her blood pressure has been running in the 110s to 120s over 90s to 100s.   Past Medical History:  Diagnosis Date   Acute pancreatitis 10/23/2021   Acute renal failure superimposed on stage 4 chronic kidney disease ([place]) 10/24/2021   Allergy    Anemia    Normocytic   Antibiotic-induced yeast infection 09/28/2020   Anxiety    Asthma    Blood dyscrasia    Bronchitis 2005   Bursitis of left shoulder 09/06/2019   CKD (chronic kidney disease)    CLASS 1-EXOPHTHALMOS-THYROTOXIC 02/08/2007   COVID-19 virus infection 04/28/2022   Diabetes mellitus without complication (HCC)    Family history of breast cancer    Family history of lung cancer    Family history of prostate cancer    Gastroenteritis 07/10/2007   Genetic testing 07/25/2018   [employer] + RNA Insight was ordered.  Genes Analyzed (43 total): APC*, ATM*, AXIN2, BARD1, BMPR1A, BRCA1*, BRCA2*, BRIP1*, CDH1*, CDK4, CDKN2A, CHEK2*,  DICER1, GALNT12, HOXB13, MEN1, MLH1*, MRE11A, MSH2*, MSH3, MSH6*, MUTYH*, NBN, NF1*, NTHL1, PALB2*, PMS2*, POLD1, POLE, PTEN*, RAD50, RAD51C*, RAD51D*, RET, SDHB, SDHD, SMAD4, SMARCA4, STK11 and TP53* (sequencing and deletion/duplication); EGFR (s   GERD 07/24/2006   GRAVE'S DISEASE 01/01/2008   History of hidradenitis suppurativa    History of kidney stones    History of thrush    HIV DISEASE 07/24/2006   dx March 05   Hyperlipidemia    HYPERTENSION 07/24/2006   Hyperthyroidism 08/2006   Grave's Disease -diffuse radiotracer uptake  08/25/06 Thyroid scan-Cold nodule to R lower lobe of thyrorid   Menometrorrhagia    hx of   Nephrolithiasis    Panniculitis 05/12/2020   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 Completed 2 yrs Prednisone after lung bx confirmation. No symptoms since then.   Sebaceous cyst 04/21/2020   Suppurative hidradenitis    Thyroid cancer ([place])    THYROID NODULE, RIGHT 02/08/2007    Past Surgical History:  Procedure Laterality Date   APPLICATION OF WOUND VAC N/A 01/20/2021   Procedure: APPLICATION OF WOUND VAC;  Surgeon: Wallace Going, DO;  Location: [facility];  Service: Plastics;  Laterality: N/A;   BREAST EXCISIONAL BIOPSY Right 04/26/2018   right axilla negative   BREAST EXCISIONAL BIOPSY Left 04/26/2018   left axilla negative   BREAST LUMPECTOMY Right 10/03/2018   malignant   BREAST LUMPECTOMY WITH RADIOACTIVE SEED AND SENTINEL LYMPH NODE BIOPSY Right 10/03/2018   Procedure: RIGHT BREAST LUMPECTOMY WITH RADIOACTIVE SEED AND SENTINEL LYMPH NODE MAPPING;  Surgeon: Erroll Luna, MD;  Location: [facility];  Service: General;  Laterality: Right;   BREAST SURGERY  1997   Breast Reduction    CYSTOSCOPY W/ URETERAL STENT REMOVAL  11/09/2012   Procedure: CYSTOSCOPY WITH STENT REMOVAL;  Surgeon: Alexis Frock, MD;  Location: [facility];  Service: Urology;  Laterality: Right;   CYSTOSCOPY WITH RETROGRADE PYELOGRAM, URETEROSCOPY AND STENT PLACEMENT  11/09/2012   Procedure: CYSTOSCOPY WITH RETROGRADE PYELOGRAM, URETEROSCOPY AND STENT  PLACEMENT;  Surgeon: Alexis Frock, MD;  Location: [facility];  Service: Urology;  Laterality: Left;  LEFT URETEROSCOPY, STONE MANIPULATION, left STENT exchange    CYSTOSCOPY WITH STENT PLACEMENT  10/02/2012   Procedure: CYSTOSCOPY WITH STENT PLACEMENT;  Surgeon: Alexis Frock, MD;  Location: [facility];  Service: Urology;  Laterality: Left;   DEBRIDEMENT AND CLOSURE WOUND N/A 01/20/2021   Procedure: Excision of abdominal wound with closure;  Surgeon: Wallace Going, DO;  Location: [facility];  Service: Plastics;  Laterality: N/A;   DILATION AND CURETTAGE OF UTERUS  Feb 2004   s/p for 1st trimester nonviable pregnancy   EYE SURGERY     sty under eyelid   INCISE AND DRAIN ABCESS  Nov 03   s/p I &D for righ inframmary fold hidradenitis   INCISION AND DRAINAGE PERITONSILLAR ABSCESS  Mar 03   IR CV LINE INJECTION  06/07/2018   IR IMAGING GUIDED PORT INSERTION  06/20/2018   IR REMOVAL TUN ACCESS W/ PORT W/O FL  MOD SED  06/20/2018   IRRIGATION AND DEBRIDEMENT ABSCESS  01/31/2012   Procedure: IRRIGATION AND DEBRIDEMENT ABSCESS;  Surgeon: Shann Medal, MD;  Location[facility];  Service: General;  Laterality: Right;  right breast and axilla    NEPHROLITHOTOMY  10/02/2012   Procedure: NEPHROLITHOTOMY PERCUTANEOUS;  Surgeon: Alexis Frock, MD;  [facility];  Service: Urology;  Laterality: Right;  First Stage Percutaneous Nephrolithotomy with Surgeon Access, Left Ureteral Stent     NEPHROLITHOTOMY  10/04/2012   Procedure: NEPHROLITHOTOMY PERCUTANEOUS SECOND LOOK;  Surgeon: Alexis Frock, MD;  [facility];  Service: Urology;  Laterality: Right;      NEPHROLITHOTOMY  10/08/2012   Procedure: NEPHROLITHOTOMY PERCUTANEOUS;  Surgeon: Alexis Frock, MD;  [facility];  Service: Urology;  Laterality: Right;  THIRD STAGE, nephrostomy tube exchange x 2   NEPHROLITHOTOMY  10/11/2012   Procedure: NEPHROLITHOTOMY PERCUTANEOUS SECOND LOOK;  Surgeon: Alexis Frock, MD;  [facility];   Service: Urology;  Laterality: Right;  RIGHT 4 STAGE PERCUTANOUS NEPHROLITHOTOMY, right URETEROSCOPY WITH HOLMIUM LASER    PANNICULECTOMY N/A 12/21/2020   Procedure: PANNICULECTOMY;  Surgeon: Wallace Going, DO;  Location: [facility];  Service: Plastics;  Laterality: N/A;   PORT-A-CATH REMOVAL N/A 07/16/2020   Procedure: REMOVAL PORT-A-CATH;  Surgeon: Erroll Luna, MD;  Location: [facility];  Service: General;  Laterality: N/A;   PORTACATH PLACEMENT Left 05/17/2018   Procedure: INSERTION PORT-A-CATH;  Surgeon: Coralie Keens, MD;  Location: [facility];  Service: General;  Laterality: Left;   RADICAL NECK DISSECTION  04/12/2017   limited/notes 04/12/2017   RADICAL NECK DISSECTION N/A 04/12/2017   Procedure: RADICAL NECK DISSECTION;  Surgeon: Melida Quitter, MD;  Location: [facility];  Service: ENT;  Laterality: N/A;  limited neck dissection 2 hours total   REDUCTION MAMMAPLASTY Bilateral 1998   RIGHT/LEFT HEART CATH AND CORONARY ANGIOGRAPHY N/A 03/12/2020   Procedure: RIGHT/LEFT HEART CATH AND CORONARY ANGIOGRAPHY;  Surgeon: Jolaine Artist, MD;  Location: [facility];  Service: Cardiovascular;  Laterality: N/A;   [place]  04/12/2017   completion/notes 04/12/2017   THYROIDECTOMY N/A 04/12/2017   Procedure: THYROIDECTOMY;  Surgeon: Melida Quitter, MD;  Location: [facility];  Service: ENT;  Laterality: N/A;  Completion Thyroidectomy   TOTAL THYROIDECTOMY  2010    Current Medications: No outpatient medications have been marked as taking for the 10/05/22 encounter (Appointment) with Skeet Latch, MD.     AllergiesJorje Guild [elviteg-cobic-emtricit-tenofaf], Lisinopril, Tape, and Tegaderm ag mesh [silver]   Social History   Socioeconomic History   Marital status: Single    Spouse name: Not on file   Number of children: 1   Years of education: Not on file   Highest education level: Not on file  Occupational History   Occupation:  Secondary school teacher  Tobacco Use   Smoking status: Former    Packs/day: 0.50    Years: 15.00    Total pack years: 7.50    Types: Cigarettes    Start date: 04/12/2017    Quit date: 2019    Years since quitting: 4.9   Smokeless tobacco: Never  Vaping Use   Vaping Use: Never used  Substance and Sexual Activity   Alcohol use: Yes    Alcohol/week: 0.0 standard drinks of alcohol    Comment: social   Drug use: No   Sexual activity: Not Currently    Birth control/protection: Post-menopausal    Comment: declined condoms  Other Topics Concern   Not on file  Social History Narrative   Right Handed    Lives in a two story home   Social Determinants of Health   Financial Resource Strain: Not on file  Food Insecurity: Not on file  Transportation Needs: No Transportation Needs (10/25/2018)   PRAPARE - Hydrologist (Medical): No    Lack of Transportation (Non-Medical): No  Physical Activity: Not on file  Stress: Not on file  Social Connections: Not on file     Family History: The patient's family history includes Breast cancer in her cousin, cousin, cousin, maternal aunt, paternal aunt, and paternal aunt; Breast cancer (age of onset: 43) in her cousin; Breast cancer (age of onset: 24) in her paternal aunt; Breast cancer (age of onset: 53) in her maternal aunt; Cancer in her maternal uncle and mother; Diabetes in an other family member; Heart disease in her mother and another family member; Hypertension in her brother, father, mother, sister, sister, and another family member; Kidney disease in an other family member; Lung cancer in her paternal uncle; Lung cancer (age of onset: 81) in her father; Prostate cancer in her paternal uncle and paternal uncle; Stroke in an other family member. There is no history of Colon cancer, Esophageal cancer, Rectal cancer, or Stomach cancer.  ROS:   Please see the history of present illness.    All other systems reviewed and are  negative.  EKGs/Labs/Other Studies Reviewed:    EKG:  EKG is ordered today.   08/13/2020: Sinus rhythm.  Rate 71 bpm.  QTc 485 ms  RHC/LHC 02/2020: Findings:   Ao = 175/101 (131) LV = 175/11 RA = 6 RV = 40/11 PA = 41/12 (26) PCW = 10 Fick cardiac output/index = 5.1/2.4 PVR = 3.2 WU Ao sat = 96% PA sat = 64%, 65%   Assessment: 1. Normal coronary arteries 2. LVEF 60-65% 3. Mild PAH likely due to OSA/OHS 4. Otherwise normal hemodynamics 4. Severe systemic HTN  Echo 02/2020: 1. Left ventricular ejection fraction, by estimation, is 60 to 65%. The  left ventricle has normal function. The left ventricle has no regional  wall motion abnormalities. Left ventricular diastolic parameters are  consistent with Grade I diastolic  dysfunction (impaired relaxation). The average left ventricular global  longitudinal strain is -14.4 %.   2. Right ventricular systolic function is normal. The right ventricular  size is normal.   3. The mitral valve is normal in structure. Mild mitral valve  regurgitation. No evidence of mitral stenosis.   4. The aortic valve is tricuspid. Aortic valve regurgitation is not  visualized. Mild aortic valve sclerosis is present, with no evidence of  aortic valve stenosis.   5. The inferior vena cava is normal in size with greater than 50%  respiratory variability, suggesting right atrial pressure of 3 mmHg.     Recent Labs: 10/23/2021: Magnesium 2.0 08/23/2022: TSH 2.61 08/31/2022: ALT 11; Hemoglobin 11.1; Platelets 216 09/23/2022: BUN 32; Creatinine, Ser 2.86; Potassium 3.6; Sodium 141   Recent Lipid Panel    Component Value Date/Time   CHOL 134 07/29/2022 1248   TRIG 103.0 07/29/2022 1248   HDL 35.90 (L) 07/29/2022 1248   CHOLHDL 4 07/29/2022 1248   VLDL 20.6 07/29/2022 1248   LDLCALC 77 07/29/2022 1248   LDLCALC 71 03/17/2021 1050   LDLDIRECT 171.0 06/05/2019 1128    Physical Exam:    VS:  LMP 03/31/2014 (LMP Unknown)  , BMI There  is no height  or weight on file to calculate BMI. GENERAL:  Well appearing HEENT: Pupils equal round and reactive, fundi not visualized, oral mucosa unremarkable NECK:  No jugular venous distention, waveform within normal limits, carotid upstroke brisk and symmetric, no bruits LUNGS:  Clear to auscultation bilaterally HEART:  RRR.  PMI not displaced or sustained,S1 and S2 within normal limits, no S3, no S4, no clicks, no rubs, no murmurs ABD:  Flat, positive bowel sounds normal in frequency in pitch, no bruits, no rebound, no guarding, no midline pulsatile mass, no hepatomegaly, no splenomegaly EXT:  2 plus pulses throughout, no edema, no cyanosis no clubbing SKIN:  No rashes no nodules NEURO:  Cranial nerves II through XII grossly intact, motor grossly intact throughout PSYCH:  Cognitively intact, oriented to person place and time   ASSESSMENT:    No diagnosis found.  PLAN:    # Resistant hypertension:   Ms. Fee's blood pressure has been a little bit better lately.  However her diastolic blood pressures have been higher.  Today her blood pressure was even higher than usual.  This may be because she just started taking prednisone for a gout flare.  She is also been eating more barbecue lately.  She is going to start tracking her blood pressures at home and bring this to follow-up.  She will continue irbesartan/HCTZ and spironolactone.  We will switch her metoprolol to carvedilol 25 mg twice daily for improved blood pressure control.  She will start back limiting the salt in her diet.  She is also interested in going to the prep exercise program through the [facility] to learn more about diet and exercise opportunities.  I suspect that normalization of her thyroid will help, as well getting on her CPAP.  In the future, would like to check for hyperaldosteronism, especially given that she had hypokalemia with a potassium of 2.9 in the past.  However checking with me we would need to hold her irbesartan/HCTZ and  spironolactone.  We will defer that for now given that her blood pressure is so high.  We will also refer her for renal artery Dopplers.  Secondary Causes of Hypertension  Medications/Herbal: OCP, steroids, stimulants, antidepressants, weight loss medication, immune suppressants, NSAIDs, sympathomimetics, alcohol, caffeine, licorice, ginseng, St. John's wort, chemo  Sleep Apnea: CPAP pending Renal artery stenosis: Check renal artery Dopplers Hyperaldosteronism: Check once blood pressure more stable Hyper/hypothyroidism: Working on thyroid management Pheochromocytoma: (testing not indicated)  Cushing's syndrome: (testing not indicated)  Coarctation of the aorta: Negative on CT 09/2018     Disposition:    FU with MD/PharmD in 1 month    Medication Adjustments/Labs and Tests Ordered: Current medicines are reviewed at length with the patient today.  Concerns regarding medicines are outlined above.  No orders of the defined types were placed in this encounter.  No orders of the defined types were placed in this encounter.    Waynetta Pean  10/04/2022 11:44 AM    [facility]

## 2022-10-05 ENCOUNTER — Ambulatory Visit (HOSPITAL_BASED_OUTPATIENT_CLINIC_OR_DEPARTMENT_OTHER): Payer: Commercial Managed Care - HMO | Admitting: Cardiovascular Disease

## 2022-10-17 ENCOUNTER — Other Ambulatory Visit: Payer: Self-pay | Admitting: Internal Medicine

## 2022-10-19 ENCOUNTER — Encounter: Payer: Self-pay | Admitting: Family Medicine

## 2022-10-19 ENCOUNTER — Ambulatory Visit (INDEPENDENT_AMBULATORY_CARE_PROVIDER_SITE_OTHER): Payer: Commercial Managed Care - HMO | Admitting: Family Medicine

## 2022-10-19 VITALS — Ht 65.0 in

## 2022-10-19 DIAGNOSIS — J069 Acute upper respiratory infection, unspecified: Secondary | ICD-10-CM

## 2022-10-19 DIAGNOSIS — Z23 Encounter for immunization: Secondary | ICD-10-CM | POA: Diagnosis not present

## 2022-10-19 DIAGNOSIS — R300 Dysuria: Secondary | ICD-10-CM

## 2022-10-19 LAB — POCT INFLUENZA A/B
Influenza A, POC: NEGATIVE
Influenza B, POC: NEGATIVE

## 2022-10-19 LAB — POCT URINALYSIS DIPSTICK
Bilirubin, UA: NEGATIVE
Blood, UA: NEGATIVE
Glucose, UA: NEGATIVE
Ketones, UA: NEGATIVE
Leukocytes, UA: NEGATIVE
Nitrite, UA: NEGATIVE
Protein, UA: POSITIVE — AB
Spec Grav, UA: >=1.03 — AB (ref 1.010–1.025)
Urobilinogen, UA: 0.2 E.U./dL
pH, UA: 6 (ref 5.0–8.0)

## 2022-10-19 LAB — POC COVID19 BINAXNOW: SARS Coronavirus 2 Ag: NEGATIVE

## 2022-10-19 NOTE — Progress Notes (Signed)
[facility] [address] [place] [address] Dept: [phone] Dept Fax: [phone]  Office Visit  Subjective:    Patient ID: [name], female    DOB: 1965/12/12, 57 y.o..   MRN: [id number]  Chief Complaint  Patient presents with   Acute Visit    C/o having frequent urination and pain x 10/14/22.  Also having head/chest congestion, SOB, sneezing, fever on 10/12/22.  Home covid test negative.   Has taken Mucinex, nasal spray, inhaler.     History of Present Illness:  Patient is in today complaining of urinary frequency  and low back pain off and on over the past 2 weeks. She notes she was seen back in Nov. at an ED or urgent care and was told she had a UTI. She wonders if this is recurring at this point. Ms. Ezzell has a history of diabetes.  Ms. Deyarmin notes [place] the [place] members [place] the [place] holiday. She notes that multiple of those present came down with a respiratory illness. She was feeling well until the day after Christmas. She developed head and chest congestion with some associated cough. She has been using her Flovent inhaler and recently started back to using her albuterol.  Past Medical History: Patient Active Problem List   Diagnosis Date Noted   Bilateral low back pain without sciatica 08/29/2022   Nausea 08/29/2022   Chronic pain of both knees 07/29/2022   Pruritus 11/26/2021   Chronic illness 10/27/2021   Lymphadenopathy 07/01/2021   Paresthesias 06/30/2021   Chronic pain of right hand 06/30/2021   Chronic low back pain 06/30/2021   COVID-19 long hauler 05/11/2021   Voice hoarseness 12/15/2020   Recurrent falls 11/05/2020   Impingement syndrome of left shoulder region 05/27/2020   Inclusion cyst 05/01/2020   Postoperative breast asymmetry 02/28/2020   Cognitive dysfunction 02/21/2020   Chronic gout 01/27/2020   Hematuria 12/10/2019   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/10/2019   Dysphagia 07/23/2019   CKD (chronic kidney disease) stage 4, GFR 15-29 ml/min (HCC) 07/12/2019   Normocytic anemia 07/12/2019   Preventative health care 09/10/2018   Port-A-Cath in place 07/12/2018   Family history of breast cancer    Family history of prostate cancer    Family history of lung cancer    Malignant neoplasm of lower-inner quadrant of right breast of female, estrogen receptor positive ([place]) 05/10/2018   Thyroid cancer ([place])    Hyperlipidemia 04/26/2017   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   GAD (generalized anxiety disorder) 04/11/2017   Mass of left side of neck 11/24/2016   Laryngopharyngeal reflux (LPR) 11/24/2016   Upper airway cough syndrome 11/11/2016   Obesity (BMI 30-39.9) 10/14/2016   Allergic rhinitis 02/18/2016   Non-insulin dependent type 2 diabetes mellitus ([place]) 06/23/2015   Renal calculi 10/29/2014   Recurrent boils 06/11/2014   Hidradenitis suppurativa 01/30/2012   Postsurgical hypothyroidism 03/20/2011   Sarcoidosis 02/08/2007   Cigarette smoker 02/08/2007   HIV disease ([place]) 07/24/2006   Depression 07/24/2006   Essential hypertension 07/24/2006   Asthma 07/24/2006   Past Surgical History:  Procedure Laterality Date   APPLICATION OF WOUND VAC N/A 01/20/2021   Procedure: APPLICATION OF WOUND VAC;  Surgeon: Wallace Going, DO;  Location: [facility];  Service: Plastics;  Laterality: N/A;   BREAST EXCISIONAL BIOPSY Right 04/26/2018   right axilla negative   BREAST EXCISIONAL BIOPSY Left 04/26/2018  left axilla negative   BREAST LUMPECTOMY Right 10/03/2018   malignant   BREAST LUMPECTOMY WITH RADIOACTIVE SEED AND SENTINEL LYMPH NODE BIOPSY Right 10/03/2018   Procedure: RIGHT BREAST LUMPECTOMY WITH RADIOACTIVE SEED AND SENTINEL LYMPH NODE MAPPING;  Surgeon: Erroll Luna, MD;  Location: [facility];  Service: General;  Laterality: Right;   BREAST SURGERY  1997   Breast  Reduction    CYSTOSCOPY W/ URETERAL STENT REMOVAL  11/09/2012   Procedure: CYSTOSCOPY WITH STENT REMOVAL;  Surgeon: Alexis Frock, MD;  Location: [facility];  Service: Urology;  Laterality: Right;   CYSTOSCOPY WITH RETROGRADE PYELOGRAM, URETEROSCOPY AND STENT PLACEMENT  11/09/2012   Procedure: CYSTOSCOPY WITH RETROGRADE PYELOGRAM, URETEROSCOPY AND STENT PLACEMENT;  Surgeon: Alexis Frock, MD;  Location: [facility];  Service: Urology;  Laterality: Left;  LEFT URETEROSCOPY, STONE MANIPULATION, left STENT exchange    CYSTOSCOPY WITH STENT PLACEMENT  10/02/2012   Procedure: CYSTOSCOPY WITH STENT PLACEMENT;  Surgeon: Alexis Frock, MD;  Location: [facility];  Service: Urology;  Laterality: Left;   DEBRIDEMENT AND CLOSURE WOUND N/A 01/20/2021   Procedure: Excision of abdominal wound with closure;  Surgeon: Wallace Going, DO;  Location: [facility];  Service: Plastics;  Laterality: N/A;   DILATION AND CURETTAGE OF UTERUS  Feb 2004   s/p for 1st trimester nonviable pregnancy   EYE SURGERY     sty under eyelid   INCISE AND DRAIN ABCESS  Nov 03   s/p I &D for righ inframmary fold hidradenitis   INCISION AND DRAINAGE PERITONSILLAR ABSCESS  Mar 03   IR CV LINE INJECTION  06/07/2018   IR IMAGING GUIDED PORT INSERTION  06/20/2018   IR REMOVAL TUN ACCESS W/ PORT W/O FL MOD SED  06/20/2018   IRRIGATION AND DEBRIDEMENT ABSCESS  01/31/2012   Procedure: IRRIGATION AND DEBRIDEMENT ABSCESS;  Surgeon: Shann Medal, MD;  Location: [facility];  Service: General;  Laterality: Right;  right breast and axilla    NEPHROLITHOTOMY  10/02/2012   Procedure: NEPHROLITHOTOMY PERCUTANEOUS;  Surgeon: Alexis Frock, MD;  Location: [facility];  Service: Urology;  Laterality: Right;  First Stage Percutaneous Nephrolithotomy with Surgeon Access, Left Ureteral Stent     NEPHROLITHOTOMY  10/04/2012   Procedure: NEPHROLITHOTOMY PERCUTANEOUS SECOND LOOK;  Surgeon: Alexis Frock, MD;  Location: [facility];  Service: Urology;  Laterality:  Right;      NEPHROLITHOTOMY  10/08/2012   Procedure: NEPHROLITHOTOMY PERCUTANEOUS;  Surgeon: Alexis Frock, MD;  Location: [facility];  Service: Urology;  Laterality: Right;  THIRD STAGE, nephrostomy tube exchange x 2   NEPHROLITHOTOMY  10/11/2012   Procedure: NEPHROLITHOTOMY PERCUTANEOUS SECOND LOOK;  Surgeon: Alexis Frock, MD;  Location: [facility];  Service: Urology;  Laterality: Right;  RIGHT 4 STAGE PERCUTANOUS NEPHROLITHOTOMY, right URETEROSCOPY WITH HOLMIUM LASER    PANNICULECTOMY N/A 12/21/2020   Procedure: PANNICULECTOMY;  Surgeon: Wallace Going, DO;  Location: [facility];  Service: Plastics;  Laterality: N/A;   PORT-A-CATH REMOVAL N/A 07/16/2020   Procedure: REMOVAL PORT-A-CATH;  Surgeon: Erroll Luna, MD;  Location: [facility];  Service: General;  Laterality: N/A;   PORTACATH PLACEMENT Left 05/17/2018   Procedure: INSERTION PORT-A-CATH;  Surgeon: Coralie Keens, MD;  Location: [facility];  Service: General;  Laterality: Left;   RADICAL NECK DISSECTION  04/12/2017   limited/notes 04/12/2017   RADICAL NECK DISSECTION N/A 04/12/2017   Procedure: RADICAL NECK DISSECTION;  Surgeon: Melida Quitter, MD;  Location: [facility];  Service: ENT;  Laterality: N/A;  limited neck  dissection 2 hours total   REDUCTION MAMMAPLASTY Bilateral 1998   RIGHT/LEFT HEART CATH AND CORONARY ANGIOGRAPHY N/A 03/12/2020   Procedure: RIGHT/LEFT HEART CATH AND CORONARY ANGIOGRAPHY;  Surgeon: Jolaine Artist, MD;  Location: [facility];  Service: Cardiovascular;  Laterality: N/A;   [place]  1994   THYROIDECTOMY  04/12/2017   completion/notes 04/12/2017   THYROIDECTOMY N/A 04/12/2017   Procedure: THYROIDECTOMY;  Surgeon: Melida Quitter, MD;  Location: [facility];  Service: ENT;  Laterality: N/A;  Completion Thyroidectomy   TOTAL THYROIDECTOMY  2010   Family History  Problem Relation Age of Onset   Hypertension Mother    Cancer Mother        laryngeal   Heart disease Mother        stent    Hypertension Father    Lung cancer Father 44       hx smoking   Heart disease Other    Hypertension Other    Stroke Other        Grandparent   Kidney disease Other        Grandparent   Diabetes Other        FH of Diabetes   Hypertension Sister    Cancer Maternal Uncle        Lung CA   Hypertension Brother    Hypertension Sister    Breast cancer Maternal Aunt 65   Breast cancer Paternal Aunt 50   Prostate cancer Paternal Uncle    Breast cancer Maternal Aunt        dx 60+   Breast cancer Paternal Aunt        dx 82's   Breast cancer Paternal Aunt        dx 50's   Prostate cancer Paternal Uncle    Lung cancer Paternal Uncle    Breast cancer Cousin 69   Breast cancer Cousin        dx <50   Breast cancer Cousin        dx <50   Breast cancer Cousin        dx <50   Colon cancer Neg Hx    Esophageal cancer Neg Hx    Rectal cancer Neg Hx    Stomach cancer Neg Hx    Outpatient Medications Prior to Visit  Medication Sig Dispense Refill   acetaminophen (TYLENOL) 500 MG tablet Take 1,000 mg by mouth every 6 (six) hours as needed for mild pain.     albuterol (VENTOLIN HFA) 108 (90 Base) MCG/ACT inhaler Inhale 2 puffs into the lungs every 6 (six) hours as needed for wheezing or shortness of breath. 8 g 2   allopurinol (ZYLOPRIM) 100 MG tablet TAKE 1 TABLET BY MOUTH EVERY DAY FOR GOUT PREVENTION 90 tablet 1   Blood Glucose Monitoring Suppl (FREESTYLE LITE) w/Device KIT Inject 1 each into the skin daily. 1 kit 0   candesartan (ATACAND) 32 MG tablet TAKE 1 TABLET BY MOUTH DAILY. 30 tablet 5   carvedilol (COREG) 25 MG tablet Take 1 tablet (25 mg total) by mouth 2 (two) times daily. 180 tablet 3   cetirizine (ZYRTEC) 10 MG tablet Take 1 tablet by mouth at bedtime for allergies. 90 tablet 3   dolutegravir ([name]) 50 MG tablet Take 1 tablet (50 mg total) by mouth daily. 30 tablet 11   emtricitabine-tenofovir AF (DESCOVY) 200-25 MG tablet Take 1 tablet by mouth daily. 30 tablet 11    FLOVENT HFA 110 MCG/ACT inhaler TAKE 1 PUFF BY  MOUTH TWICE A DAY (Patient taking differently: Inhale 1 puff into the lungs 2 (two) times daily as needed (wheezing/shortness of breath).) 36 Inhaler 1   fluticasone (FLONASE) 50 MCG/ACT nasal spray Place 2 sprays into both nostrils daily. 16 g 6   gabapentin (NEURONTIN) 300 MG capsule Take 300 mg by mouth daily.     glipiZIDE (GLUCOTROL) 10 MG tablet TAKE 1 TABLET BY MOUTH TWICE A DAY FOR DIABETES 180 tablet 1   glucose blood (ONETOUCH VERIO) test strip USE AS INSTRUCTED TO TEST BLOOD SUGAR up to 3 times daily. 300 each 3   hydrALAZINE (APRESOLINE) 50 MG tablet TAKE 1 TABLET (50 MG) BY MOUTH IN THE MORNING AND AT BEDTIME 60 tablet 3   Lancets (ONETOUCH ULTRASOFT) lancets Use as instructed to test blood sugar daily 100 each 5   levothyroxine (SYNTHROID) 175 MCG tablet TAKE 1 TABLET BY MOUTH EVERY DAY 30 tablet 7   minocycline (DYNACIN) 100 MG tablet Take 1 tablet (100 mg total) by mouth 2 (two) times daily. 60 tablet 11   omeprazole (PRILOSEC) 40 MG capsule TAKE 1 CAPSULE BY MOUTH DAILY FOR REFLUX 90 capsule 3   rosuvastatin (CRESTOR) 10 MG tablet TAKE 1 TABLET BY MOUTH EVERY DAY FOR CHOLESTEROL 90 tablet 2   sertraline (ZOLOFT) 50 MG tablet TAKE 1 TABLET (50 MG TOTAL) BY MOUTH DAILY. FOR ANXIETY AND DEPRESSION. 90 tablet 3   spironolactone (ALDACTONE) 25 MG tablet Take 1 tablet (25 mg total) by mouth daily. 90 tablet 3   tamoxifen (NOLVADEX) 20 MG tablet Take 1 tablet (20 mg total) by mouth daily. 90 tablet 4   hydrOXYzine (ATARAX) 10 MG tablet Take 1 tablet (10 mg total) by mouth 2 (two) times daily as needed for itching. 30 tablet 0   promethazine (PHENERGAN) 12.5 MG tablet Take 1 tablet (12.5 mg total) by mouth every 8 (eight) hours as needed for nausea or vomiting. 20 tablet 0   Facility-Administered Medications Prior to Visit  Medication Dose Route Frequency Provider Last Rate Last Admin   sodium chloride flush (NS) 0.9 % injection 10 mL  10 mL  Intracatheter PRN Magrinat, Virgie Dad, MD   10 mL at 09/19/18 1551   Allergies  Allergen Reactions   Genvoya [Elviteg-Cobic-Emtricit-Tenofaf] Hives   Lisinopril Cough   Tape Rash    Rash  Okay with tegaderm and paper tape   Tegaderm Ag Mesh [Silver] Itching     Objective:   Today's Vitals   10/19/22 1317  Height: _0  (1.651 m)   Body mass index is 35.67 kg/m.   General: Well developed, well nourished. No acute distress. HEENT: Normocephalic, non-traumatic. Conjunctiva clear. External ears normal. EAC and TMs normal   bilaterally. Nose clear without congestion or rhinorrhea. Mucous membranes moist. Oropharynx clear.   Good dentition. Neck: Supple. No lymphadenopathy. No thyromegaly. Lungs: Clear to auscultation bilaterally. No wheezing, rales or rhonchi. Psych: Alert and oriented. Normal mood and affect.  Health Maintenance Due  Topic Date Due   OPHTHALMOLOGY EXAM  07/15/2016   DTaP/Tdap/Td (2 - Td or Tdap) 10/18/2019   PAP SMEAR-Modifier  09/28/2021   FOOT EXAM  05/19/2022   Lab Results POCT Covid: Neg. POCT Influenza A& B: Neg.  Component Ref Range & Units 13:40 (10/19/22)  Color, UA  yellow  Clarity, UA  clear  Glucose, UA Negative Negative  Bilirubin, UA  neg  Ketones, UA  neg  Spec Grav, UA 1.010 - 1.025 >=1.030 Abnormal   Blood, UA  neg  pH, UA 5.0 - 8.0 6.0  Protein, UA Negative Positive Abnormal   Comment: 3+  Urobilinogen, UA 0.2 or 1.0 E.U./dL 0.2  Nitrite, UA  neg  Leukocytes, UA Negative Negative      Assessment & Plan:   1. Dysuria The dipstick is not suggestive of a UTI. Ms. Prosser does have known proteinuria, likely due to her diabetes and hypertension. I reminded her to avoid use of ibuprofen. She should follow up with Ms. Carlis Abbott regarding her low back pain.  - POCT Urinalysis Dipstick - Urine Culture  2. Viral URI with cough Discussed home care for viral illness, including rest, pushing fluids, and OTC medications as needed for symptom  relief. Recommend hot tea with honey for sore throat symptoms. Follow-up if needed for worsening or persistent symptoms.  - POCT Influenza A/B - POC COVID-19  Return if symptoms worsen or fail to improve, with PCP.   Haydee Salter, MD

## 2022-10-20 LAB — URINE CULTURE
MICRO NUMBER:: 14382830
Result:: NO GROWTH
SPECIMEN QUALITY:: ADEQUATE

## 2022-10-27 ENCOUNTER — Ambulatory Visit (HOSPITAL_BASED_OUTPATIENT_CLINIC_OR_DEPARTMENT_OTHER): Payer: Commercial Managed Care - HMO | Admitting: Family

## 2022-10-28 ENCOUNTER — Other Ambulatory Visit: Payer: Self-pay | Admitting: Primary Care

## 2022-10-28 DIAGNOSIS — J069 Acute upper respiratory infection, unspecified: Secondary | ICD-10-CM

## 2022-11-03 ENCOUNTER — Ambulatory Visit (INDEPENDENT_AMBULATORY_CARE_PROVIDER_SITE_OTHER): Payer: Commercial Managed Care - HMO

## 2022-11-03 ENCOUNTER — Ambulatory Visit
Admission: EM | Admit: 2022-11-03 | Discharge: 2022-11-03 | Disposition: A | Discharge status: Home / Self Care | Payer: Commercial Managed Care - HMO | Attending: Physician Assistant | Admitting: Physician Assistant

## 2022-11-03 DIAGNOSIS — S90121A Contusion of right lesser toe(s) without damage to nail, initial encounter: Secondary | ICD-10-CM

## 2022-11-03 DIAGNOSIS — M25571 Pain in right ankle and joints of right foot: Secondary | ICD-10-CM

## 2022-11-03 DIAGNOSIS — S9031XA Contusion of right foot, initial encounter: Secondary | ICD-10-CM

## 2022-11-03 DIAGNOSIS — M79671 Pain in right foot: Secondary | ICD-10-CM

## 2022-11-03 LAB — POCT FASTING CBG KUC MANUAL ENTRY: POCT Glucose (KUC): 215 mg/dL — AB (ref 70–99)

## 2022-11-03 MED ORDER — HYDROCODONE-ACETAMINOPHEN 5-325 MG PO TABS: 2.0000 | ORAL_TABLET | ORAL | 0 refills | Status: DC | PRN

## 2022-11-03 MED ORDER — IBUPROFEN 600 MG PO TABS: 600.0000 mg | ORAL_TABLET | Freq: Four times a day (QID) | ORAL | 0 refills | Status: DC | PRN

## 2022-11-03 NOTE — ED Triage Notes (Signed)
Pt injured toe on the right foot yesterday causing pain and swelling.

## 2022-11-03 NOTE — ED Provider Notes (Signed)
[facility]    CSN: [id number] Arrival date & time: 11/03/22  1228      History   Chief Complaint Chief Complaint  Patient presents with   Toe Injury    HPI [name] is a 57 y.o. female.   57 year old female presents with right foot and toe pain.  Patient indicates yesterday she was walking down the steps when she tripped and hit her left foot and toes against the wall.  She indicates that since then she has been having pain, swelling and discomfort of the pinky toe.  She indicates that she also has pain when she walks and the discomfort is causing her to limp.  She indicates she has been using ice and also took one of her oxycodone which did not give her much relief from her discomfort.  She is concerned that she may have a fracture of the little toe.     Past Medical History:  Diagnosis Date   Acute pancreatitis 10/23/2021   Acute renal failure superimposed on stage 4 chronic kidney disease ([place]) 10/24/2021   Allergy    Anemia    Normocytic   Antibiotic-induced yeast infection 09/28/2020   Anxiety    Asthma    Blood dyscrasia    Bronchitis 2005   Bursitis of left shoulder 09/06/2019   CKD (chronic kidney disease)    CLASS 1-EXOPHTHALMOS-THYROTOXIC 02/08/2007   COVID-19 virus infection 04/28/2022   Diabetes mellitus without complication (HCC)    Family history of breast cancer    Family history of lung cancer    Family history of prostate cancer    Gastroenteritis 07/10/2007   Genetic testing 07/25/2018   [employer] + RNA Insight was ordered.  Genes Analyzed (43 total): APC*, ATM*, AXIN2, BARD1, BMPR1A, BRCA1*, BRCA2*, BRIP1*, CDH1*, CDK4, CDKN2A, CHEK2*, DICER1, GALNT12, HOXB13, MEN1, MLH1*, MRE11A, MSH2*, MSH3, MSH6*, MUTYH*, NBN, NF1*, NTHL1, PALB2*, PMS2*, POLD1, POLE, PTEN*, RAD50, RAD51C*, RAD51D*, RET, SDHB, SDHD, SMAD4, SMARCA4, STK11 and TP53* (sequencing and deletion/duplication); EGFR (s   GERD 07/24/2006   GRAVE'S DISEASE  01/01/2008   History of hidradenitis suppurativa    History of kidney stones    History of thrush    HIV DISEASE 07/24/2006   dx March 05   Hyperlipidemia    HYPERTENSION 07/24/2006   Hyperthyroidism 08/2006   Grave's Disease -diffuse radiotracer uptake 08/25/06 Thyroid scan-Cold nodule to R lower lobe of thyrorid   Menometrorrhagia    hx of   Nephrolithiasis    Panniculitis 05/12/2020   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 Completed 2 yrs Prednisone after lung bx confirmation. No symptoms since then.   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   Voice hoarseness 12/15/2020   Recurrent falls 11/05/2020   Impingement syndrome of left shoulder region 05/27/2020   Inclusion cyst 05/01/2020   Postoperative breast asymmetry 02/28/2020   Cognitive dysfunction 02/21/2020   Chronic gout 01/27/2020   Hematuria 12/10/2019   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   Dysphagia 07/23/2019   CKD (chronic kidney disease) stage 4, GFR 15-29 ml/min (HCC) 07/12/2019    Normocytic anemia 07/12/2019   Preventative health care 09/10/2018   Port-A-Cath in place 07/12/2018   Family history of breast cancer    Family history of prostate cancer    Family history of lung cancer    Malignant neoplasm of lower-inner quadrant of right breast of female, estrogen receptor positive ([place]) 05/10/2018   Thyroid cancer ([place])    Hyperlipidemia 04/26/2017   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   Sarcoidosis 02/08/2007   Cigarette smoker 02/08/2007   HIV disease ([place]) 07/24/2006   Depression 07/24/2006   Essential hypertension 07/24/2006   Asthma 07/24/2006    Past Surgical History:  Procedure Laterality Date   APPLICATION OF WOUND VAC N/A 01/20/2021   Procedure: APPLICATION OF WOUND VAC;  Surgeon: Wallace Going, DO;  Location: [place];  Service: Plastics;  Laterality: N/A;   BREAST EXCISIONAL BIOPSY Right 04/26/2018   right axilla negative   BREAST EXCISIONAL BIOPSY Left 04/26/2018   left axilla negative   BREAST LUMPECTOMY Right 10/03/2018   malignant   BREAST LUMPECTOMY WITH RADIOACTIVE SEED AND SENTINEL LYMPH NODE BIOPSY Right 10/03/2018   Procedure: RIGHT BREAST LUMPECTOMY WITH RADIOACTIVE SEED AND SENTINEL LYMPH NODE MAPPING;  Surgeon: Erroll Luna, MD;  Location: [place];  Service: General;  Laterality: Right;   BREAST SURGERY  1997   Breast Reduction    CYSTOSCOPY W/ URETERAL STENT REMOVAL  11/09/2012   Procedure: CYSTOSCOPY WITH STENT REMOVAL;  Surgeon: Alexis Frock, MD;   Location[facility];  Service: Urology;  Laterality: Right;   CYSTOSCOPY WITH RETROGRADE PYELOGRAM, URETEROSCOPY AND STENT PLACEMENT  11/09/2012   Procedure: CYSTOSCOPY WITH RETROGRADE PYELOGRAM, URETEROSCOPY AND STENT PLACEMENT;  Surgeon: Alexis Frock, MD;  [facility];  Service: Urology;  Laterality: Left;  LEFT URETEROSCOPY, STONE MANIPULATION, left STENT exchange    CYSTOSCOPY WITH STENT PLACEMENT  10/02/2012   Procedure: CYSTOSCOPY WITH STENT PLACEMENT;  Surgeon: Alexis Frock, MD;  [facility];  Service: Urology;  Laterality: Left;   DEBRIDEMENT AND CLOSURE WOUND N/A 01/20/2021   Procedure: Excision of abdominal wound with closure;  Surgeon: Wallace Going, DO;  Location: [place];  Service: Plastics;  Laterality: N/A;   DILATION AND CURETTAGE OF UTERUS  Feb 2004   s/p for 1st trimester nonviable pregnancy   EYE SURGERY     sty under eyelid   INCISE AND DRAIN ABCESS  Nov 03   s/p I &D for righ inframmary fold hidradenitis   INCISION AND DRAINAGE PERITONSILLAR ABSCESS  Mar 03  IR CV LINE INJECTION  06/07/2018   IR IMAGING GUIDED PORT INSERTION  06/20/2018   IR REMOVAL TUN ACCESS W/ PORT W/O FL MOD SED  06/20/2018   IRRIGATION AND DEBRIDEMENT ABSCESS  01/31/2012   Procedure: IRRIGATION AND DEBRIDEMENT ABSCESS;  Surgeon: Shann Medal, MD;  [facility];  Service: General;  Laterality: Right;  right breast and axilla    NEPHROLITHOTOMY  10/02/2012   Procedure: NEPHROLITHOTOMY PERCUTANEOUS;  Surgeon: Alexis Frock, MD;  [facility];  Service: Urology;  Laterality: Right;  First Stage Percutaneous Nephrolithotomy with Surgeon Access, Left Ureteral Stent     NEPHROLITHOTOMY  10/04/2012   Procedure: NEPHROLITHOTOMY PERCUTANEOUS SECOND LOOK;  Surgeon: Alexis Frock, MD;  [facility];  Service: Urology;  Laterality: Right;      NEPHROLITHOTOMY  10/08/2012   Procedure: NEPHROLITHOTOMY PERCUTANEOUS;  Surgeon: Alexis Frock, MD;  [facility];   Service: Urology;  Laterality: Right;  THIRD STAGE, nephrostomy tube exchange x 2   NEPHROLITHOTOMY  10/11/2012   Procedure: NEPHROLITHOTOMY PERCUTANEOUS SECOND LOOK;  Surgeon: Alexis Frock, MD;  [facility];  Service: Urology;  Laterality: Right;  RIGHT 4 STAGE PERCUTANOUS NEPHROLITHOTOMY, right URETEROSCOPY WITH HOLMIUM LASER    PANNICULECTOMY N/A 12/21/2020   Procedure: PANNICULECTOMY;  Surgeon: Wallace Going, DO;  Location: [place];  Service: Plastics;  Laterality: N/A;   PORT-A-CATH REMOVAL N/A 07/16/2020   Procedure: REMOVAL PORT-A-CATH;  Surgeon: Erroll Luna, MD;  Location: [place];  Service: General;  Laterality: N/A;   PORTACATH PLACEMENT Left 05/17/2018   Procedure: INSERTION PORT-A-CATH;  Surgeon: Coralie Keens, MD;  Location: [place];  Service: General;  Laterality: Left;   RADICAL NECK DISSECTION  04/12/2017   limited/notes 04/12/2017   RADICAL NECK DISSECTION N/A 04/12/2017   Procedure: RADICAL NECK DISSECTION;  Surgeon: Melida Quitter, MD;  Location: [place];  Service: ENT;  Laterality: N/A;  limited neck dissection 2 hours total   REDUCTION MAMMAPLASTY Bilateral 1998   RIGHT/LEFT HEART CATH AND CORONARY ANGIOGRAPHY N/A 03/12/2020   Procedure: RIGHT/LEFT HEART CATH AND CORONARY ANGIOGRAPHY;  Surgeon: Jolaine Artist, MD;  Location: [place] [facility];  Service: Cardiovascular;  Laterality: N/A;   [place]  04/12/2017   completion/notes 04/12/2017   THYROIDECTOMY N/A 04/12/2017   Procedure: THYROIDECTOMY;  Surgeon: Melida Quitter, MD;  Location: [place];  Service: ENT;  Laterality: N/A;  Completion Thyroidectomy   TOTAL THYROIDECTOMY  2010    OB History   No obstetric history on file.      Home Medications    Prior to Admission medications   Medication Sig Start Date End Date Taking? Authorizing Provider  acetaminophen (TYLENOL) 500 MG tablet Take 1,000 mg by mouth every 6 (six) hours as needed for mild  pain.   Yes [provider]  albuterol (VENTOLIN HFA) 108 (90 Base) MCG/ACT inhaler Inhale 2 puffs into the lungs every 6 (six) hours as needed for wheezing or shortness of breath. 10/07/20  Yes Hall-Potvin, [place], PA-C  Blood Glucose Monitoring Suppl (FREESTYLE LITE) w/Device KIT Inject 1 each into the skin daily. 11/10/20  Yes Skeet Latch, MD  candesartan (ATACAND) 32 MG tablet TAKE 1 TABLET BY MOUTH DAILY. 06/28/22  Yes Skeet Latch, MD  carvedilol (COREG) 25 MG tablet Take 1 tablet (25 mg total) by mouth 2 (two) times daily. 02/01/22  Yes Alvstad, Kristin L, RPH-CPP  cetirizine (ZYRTEC) 10 MG tablet TAKE 1 TABLET BY MOUTH AT BEDTIME FOR  ALLERGIES 10/28/22  Yes [name], NP  dolutegravir ([name]) 50 MG tablet Take 1 tablet (50 mg total) by mouth daily. 12/28/21  Yes Michel Bickers, MD  FLOVENT HFA 110 MCG/ACT inhaler TAKE 1 PUFF BY MOUTH TWICE A DAY Patient taking differently: Inhale 1 puff into the lungs 2 (two) times daily as needed (wheezing/shortness of breath). 08/27/19  Yes [name], NP  glucose blood (ONETOUCH VERIO) test strip USE AS INSTRUCTED TO TEST BLOOD SUGAR up to 3 times daily. 11/12/21  Yes [name], NP  hydrALAZINE (APRESOLINE) 50 MG tablet TAKE 1 TABLET (50 MG) BY MOUTH IN THE MORNING AND AT BEDTIME 09/20/22  Yes Skeet Latch, MD  HYDROcodone-acetaminophen (NORCO/VICODIN) 5-325 MG tablet Take 2 tablets by mouth every 4 (four) hours as needed. 11/03/22  Yes Nyoka Lint, PA-C  ibuprofen (ADVIL) 600 MG tablet Take 1 tablet (600 mg total) by mouth every 6 (six) hours as needed. 11/03/22  Yes Nyoka Lint, PA-C  Lancets [facility] ULTRASOFT) lancets Use as instructed to test blood sugar daily 11/25/20  Yes [name], NP  levothyroxine (SYNTHROID) 175 MCG tablet TAKE 1 TABLET BY MOUTH EVERY DAY 10/18/22  Yes Philemon Kingdom, MD  minocycline (DYNACIN) 100 MG tablet Take 1 tablet (100 mg total) by mouth 2 (two) times daily. 06/09/22   Yes Michel Bickers, MD  omeprazole (PRILOSEC) 40 MG capsule TAKE 1 CAPSULE BY MOUTH DAILY FOR REFLUX 11/11/21  Yes [name], NP  rosuvastatin (CRESTOR) 10 MG tablet TAKE 1 TABLET BY MOUTH EVERY DAY FOR CHOLESTEROL 09/20/22  Yes [name], NP  spironolactone (ALDACTONE) 25 MG tablet Take 1 tablet (25 mg total) by mouth daily. 11/17/21  Yes Skeet Latch, MD  tamoxifen (NOLVADEX) 20 MG tablet Take 1 tablet (20 mg total) by mouth daily. 07/27/21  Yes Magrinat, Virgie Dad, MD  allopurinol (ZYLOPRIM) 100 MG tablet TAKE 1 TABLET BY MOUTH EVERY DAY FOR GOUT PREVENTION 04/29/22   [name], NP  emtricitabine-tenofovir AF (DESCOVY) 200-25 MG tablet Take 1 tablet by mouth daily. 12/28/21   Michel Bickers, MD  fluticasone [facility]) 50 MCG/ACT nasal spray Place 2 sprays into both nostrils daily. 07/19/21   Michela Pitcher, NP  gabapentin (NEURONTIN) 300 MG capsule Take 300 mg by mouth daily.    [provider]  glipiZIDE (GLUCOTROL) 10 MG tablet TAKE 1 TABLET BY MOUTH TWICE A DAY FOR DIABETES 06/14/22   [name], NP  sertraline (ZOLOFT) 50 MG tablet TAKE 1 TABLET (50 MG TOTAL) BY MOUTH DAILY. FOR ANXIETY AND DEPRESSION. 11/11/21   [name], NP    Family History Family History  Problem Relation Age of Onset   Hypertension Mother    Cancer Mother        laryngeal   Heart disease Mother        stent   Hypertension Father    Lung cancer Father 16       hx smoking   Heart disease Other    Hypertension Other    Stroke Other        Grandparent   Kidney disease Other        Grandparent   Diabetes Other        FH of Diabetes   Hypertension Sister    Cancer Maternal Uncle        Lung CA   Hypertension Brother    Hypertension Sister    Breast cancer Maternal Aunt 65   Breast cancer  Paternal Aunt 34   Prostate cancer Paternal Uncle    Breast cancer Maternal Aunt        dx 60+   Breast cancer Paternal Aunt        dx 52's   Breast cancer Paternal  Aunt        dx 20's   Prostate cancer Paternal Uncle    Lung cancer Paternal Uncle    Breast cancer Cousin 75   Breast cancer Cousin        dx <50   Breast cancer Cousin        dx <50   Breast cancer Cousin        dx <50   Colon cancer Neg Hx    Esophageal cancer Neg Hx    Rectal cancer Neg Hx    Stomach cancer Neg Hx     Social History Social History   Tobacco Use   Smoking status: Former    Packs/day: 0.50    Years: 15.00    Total pack years: 7.50    Types: Cigarettes    Start date: 04/12/2017    Quit date: 2019    Years since quitting: 5.0   Smokeless tobacco: Never  Vaping Use   Vaping Use: Never used  Substance Use Topics   Alcohol use: Yes    Alcohol/week: 0.0 standard drinks of alcohol    Comment: social   Drug use: No     Allergies   Genvoya [elviteg-cobic-emtricit-tenofaf], Lisinopril, Tape, and Tegaderm ag mesh [silver]   Review of Systems Review of Systems  Musculoskeletal:  Positive for joint swelling (right foot 5th digit).     Physical Exam Triage Vital Signs ED Triage Vitals  Enc Vitals Group     BP 11/03/22 1248 118/70     Pulse Rate 11/03/22 1248 83     Resp 11/03/22 1248 16     Temp 11/03/22 1248 98.4 F (36.9 C)     Temp Source 11/03/22 1248 Oral     SpO2 11/03/22 1248 96 %     Weight --      Height --      Head Circumference --      Peak Flow --      Pain Score 11/03/22 1247 7     Pain Loc --      Pain Edu? --      Excl. in [place]? --    No data found.  Updated Vital Signs BP 118/70 (BP Location: Left Arm)   Pulse 83   Temp 98.4 F (36.9 C) (Oral)   Resp 16   LMP 03/31/2014 (LMP Unknown)   SpO2 96%   Visual Acuity Right Eye Distance:   Left Eye Distance:   Bilateral Distance:    Right Eye Near:   Left Eye Near:    Bilateral Near:     Physical Exam Constitutional:      Appearance: Normal appearance.  Musculoskeletal:       Feet:  Feet:     Comments: Right foot: Range of motion is normal, stability is  normal, 1+ swelling with pain on palpation of the distal fifth MTP area and pinky toe.  No unusual redness present Neurological:     Mental Status: She is alert.      UC Treatments / Results  Labs (all labs ordered are listed, but only abnormal results are displayed) Labs Reviewed  POCT FASTING [facility] [place] - Abnormal; Notable for the following components:  Result Value   POCT Glucose ([facility]) 215 (*)    All other components within normal limits    EKG   Radiology DG Foot Complete Right  Result Date: 11/03/2022 CLINICAL DATA:  Struck right foot against a wall yesterday. EXAM: RIGHT FOOT COMPLETE - 3+ VIEW COMPARISON:  None Available. FINDINGS: There is no evidence of fracture or dislocation. Mild talonavicular joint arthritis small plantar calcaneal spurring and Achilles enthesopathy. Soft tissues are unremarkable. IMPRESSION: No acute fracture or dislocation. Mild degenerative changes. Electronically Signed   By: Keane Police D.O.   On: 11/03/2022 13:40    Procedures Procedures (including critical care time)  Medications Ordered in UC Medications - No data to display  Initial Impression / Assessment and Plan / UC Course  I have reviewed the triage vital signs and the nursing notes.  Pertinent labs & imaging results that were available during my care of the patient were reviewed by me and considered in my medical decision making (see chart for details).    Plan: 1.  The right toe pain will be treated with the following: A.  Ibuprofen 600 mg every 6 hours with food to relieve pain. 2.  The contusion of the right foot will be treated with the following: A.  Ibuprofen 600 mg every 6 hours with food to relieve pain. B.  Ice therapy, 5 minutes on 10 minutes off 3-4 times throughout the day to help reduce pain and discomfort. 3.  Advised follow-up PCP or return to urgent care if symptoms fail to improve.  Final Clinical Impressions(s) / UC Diagnoses   Final  diagnoses:  Toe joint pain, right  Contusion of right lesser toe(s) w/o damage to nail, init  Contusion of right foot, initial encounter     Discharge Instructions      Advised to use ice therapy, 5 minutes on 10 minutes off, 3-4 times a day to help reduce pain and discomfort.  Advised to take ibuprofen or Naprosyn to help relieve pain, swelling discomfort.  Advised follow-up PCP or return to urgent care if symptoms fail to improve.     ED Prescriptions     Medication Sig Dispense Auth. Provider   ibuprofen (ADVIL) 600 MG tablet Take 1 tablet (600 mg total) by mouth every 6 (six) hours as needed. 30 tablet Nyoka Lint, PA-C   HYDROcodone-acetaminophen (NORCO/VICODIN) 5-325 MG tablet Take 2 tablets by mouth every 4 (four) hours as needed. 6 tablet Nyoka Lint, PA-C      I have reviewed the PDMP during this encounter.   Nyoka Lint, PA-C 11/03/22 1347

## 2022-11-03 NOTE — Discharge Instructions (Addendum)
Advised to use ice therapy, 5 minutes on 10 minutes off, 3-4 times a day to help reduce pain and discomfort.  Advised to take ibuprofen or Naprosyn to help relieve pain, swelling discomfort.  Advised follow-up PCP or return to urgent care if symptoms fail to improve.

## 2022-11-09 ENCOUNTER — Encounter: Payer: Self-pay | Admitting: Physical Therapy

## 2022-11-15 ENCOUNTER — Other Ambulatory Visit: Payer: Self-pay | Admitting: Primary Care

## 2022-11-15 DIAGNOSIS — K219 Gastro-esophageal reflux disease without esophagitis: Secondary | ICD-10-CM

## 2022-11-15 DIAGNOSIS — R490 Dysphonia: Secondary | ICD-10-CM

## 2022-11-15 DIAGNOSIS — E119 Type 2 diabetes mellitus without complications: Secondary | ICD-10-CM

## 2022-11-15 NOTE — Telephone Encounter (Signed)
Patient is due for follow up in early April. Please schedule at the end of a session, thank you!

## 2022-11-15 NOTE — Telephone Encounter (Signed)
Lvmtcb, sent mychart message  

## 2022-11-16 ENCOUNTER — Encounter: Payer: Self-pay | Admitting: Physician Assistant

## 2022-11-22 ENCOUNTER — Telehealth: Payer: Self-pay

## 2022-11-22 NOTE — Telephone Encounter (Signed)
Pt called to request order sent to [employer] for bra. Order placed per MD and faxed. Confirmation received.

## 2022-11-29 ENCOUNTER — Telehealth: Payer: Self-pay

## 2022-11-29 NOTE — Telephone Encounter (Signed)
Completed to the best of my ability and placed on [name]'s desk

## 2022-11-29 NOTE — Telephone Encounter (Signed)
We received attending physician disability forms for patient.  This is to be filled out the same as her last forms (in folder).  She was sent these forms in November, and did not receive them in the mail, so she is requesting that we backdate this form to 09/17/2022 -- 09/17/2023.  Form placed in your inbox for completion and signing.   Due date 11/07/22.  I emailed the two forms to patient that she needs to fill out.  She will try to fill them out electronically.  If she can't, she will drop them by a closer [place] site so they can get them back to me through email.  When form is complete, patient wants her copy emailed to [email or web address].

## 2022-11-29 NOTE — Telephone Encounter (Signed)
Patient sent in a disability form.  I left a vmail for patient to call back.  I want to verify that this should be filled out the same as the last form.  What dates should this be for?  Patient needs to fill out two forms prior to faxing this in.  Does she want to come in to fill these out or send them via mychart?  How would she like to receive her copy when this is complete?

## 2022-11-30 ENCOUNTER — Other Ambulatory Visit: Payer: Self-pay

## 2022-11-30 ENCOUNTER — Ambulatory Visit
Admission: RE | Admit: 2022-11-30 | Discharge: 2022-11-30 | Disposition: A | Discharge status: Home / Self Care | Payer: Commercial Managed Care - HMO | Source: Ambulatory Visit | Attending: Hematology and Oncology | Admitting: Hematology and Oncology

## 2022-11-30 DIAGNOSIS — C50311 Malignant neoplasm of lower-inner quadrant of right female breast: Secondary | ICD-10-CM

## 2022-11-30 NOTE — Telephone Encounter (Signed)
[place] Night - Client Nonclinical Telephone Record  [facility] Client [facility] Night - Client Client Site [place] - Night Provider Alma Friendly - NP Contact Type Call Who Is Calling Patient / Member / Family / Caregiver Caller Name [place] Phone Number [phone] Patient Name [name] Patient DOB 10-11-1966 Call Type Message Only Information Provided Reason for Call Request for General Office Information Initial Comment Caller states they are trying to get some assistance with disability paper work. Disp. Time Disposition Final User 11/30/2022 1:59:36 PM General Information Provided Yes Judith Blonder Call Closed By: Judith Blonder Transaction Date/Time: 11/30/2022 1:57:19 PM (ET  Also please see pt message on 11/28/22. Sending to [employer].

## 2022-11-30 NOTE — Telephone Encounter (Signed)
Sent msg via mychart that [name] has completed her part of the form.  I am waiting to get patient's portion back in order to fax this in.

## 2022-11-30 NOTE — Telephone Encounter (Addendum)
Patient sent in her two pages that needed to be completed.  Completed form has been faxed to fax# [phone].  Received fax confirmation.  Copies made for scanning, and myself.  Patient's copy has been emailed to her at: [email or web address]

## 2022-12-05 LAB — BASIC METABOLIC PANEL
BUN: 44 — AB (ref 4–21)
CO2: 27 — AB (ref 13–22)
Chloride: 106 (ref 99–108)
Creatinine: 4.1 — AB (ref 0.5–1.1)
Glucose: 121
Potassium: 3.6 mEq/L (ref 3.5–5.1)
Sodium: 142 (ref 137–147)

## 2022-12-05 LAB — COMPREHENSIVE METABOLIC PANEL
Albumin: 3.5 (ref 3.5–5.0)
Calcium: 8.9 (ref 8.7–10.7)
eGFR: 12

## 2022-12-05 LAB — IRON,TIBC AND FERRITIN PANEL
%SAT: 18
Ferritin: 465
Iron: 40
TIBC: 225
UIBC: 185

## 2022-12-05 LAB — CBC AND DIFFERENTIAL: Hemoglobin: 9.6 — AB (ref 12.0–16.0)

## 2022-12-13 ENCOUNTER — Encounter: Payer: Self-pay | Admitting: Oncology

## 2022-12-13 ENCOUNTER — Other Ambulatory Visit (HOSPITAL_COMMUNITY): Payer: Self-pay

## 2022-12-13 ENCOUNTER — Telehealth: Payer: Self-pay

## 2022-12-13 NOTE — Telephone Encounter (Signed)
[facility] Patient Advocate Encounter   Received notification from [employer] that prior authorization for Descovy is required.   PA submitted on 12/13/22 Key [id number] Status is pending    [facility] will continue to follow.   Ileene Patrick, [facility] Patient [facility] for Infectious Disease Phone: [phone] Fax:  [phone]

## 2022-12-14 ENCOUNTER — Telehealth: Payer: Self-pay

## 2022-12-14 ENCOUNTER — Encounter: Payer: Self-pay | Admitting: Primary Care

## 2022-12-14 ENCOUNTER — Emergency Department (HOSPITAL_BASED_OUTPATIENT_CLINIC_OR_DEPARTMENT_OTHER)
Admission: EM | Admit: 2022-12-14 | Discharge: 2022-12-14 | Disposition: A | Discharge status: Home / Self Care | Payer: Medicare Other | Attending: Emergency Medicine | Admitting: Emergency Medicine

## 2022-12-14 ENCOUNTER — Emergency Department (HOSPITAL_BASED_OUTPATIENT_CLINIC_OR_DEPARTMENT_OTHER): Payer: Medicare Other | Admitting: Radiology

## 2022-12-14 ENCOUNTER — Other Ambulatory Visit: Payer: Self-pay

## 2022-12-14 ENCOUNTER — Encounter (HOSPITAL_BASED_OUTPATIENT_CLINIC_OR_DEPARTMENT_OTHER): Payer: Self-pay

## 2022-12-14 DIAGNOSIS — S8991XA Unspecified injury of right lower leg, initial encounter: Secondary | ICD-10-CM | POA: Diagnosis not present

## 2022-12-14 DIAGNOSIS — Y92410 Unspecified street and highway as the place of occurrence of the external cause: Secondary | ICD-10-CM | POA: Diagnosis not present

## 2022-12-14 DIAGNOSIS — M25561 Pain in right knee: Secondary | ICD-10-CM | POA: Diagnosis present

## 2022-12-14 MED ORDER — ACETAMINOPHEN 500 MG PO TABS
1000.0000 mg | ORAL_TABLET | Freq: Once | ORAL | Status: AC
  Administered 2022-12-14: 1000 mg via ORAL
  Filled 2022-12-14: qty 2
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
## 2022-12-14 NOTE — Discharge Instructions (Addendum)
Please consider buying yourself a support sleeve or elastic knee support brace over-the-counter from a pharmacy, to help provide a little more support your knee.  You can put your leg up on the couch when you are home for the next few days and apply ice as needed, 10 minutes at a time on and off to your knee.  You can also take Tylenol at home for your pain.  If you are still having persistent pain in the right knee after 7 days please call to make an appointment with orthopedic doctor.  I provided the phone number above

## 2022-12-14 NOTE — Telephone Encounter (Signed)
[facility] Patient Advocate Encounter  Prior Authorization for Descovy has been approved.    PA# [id number] Effective dates: 12/13/22 through 12/14/23    [facility] will continue to follow.  Ileene Patrick, [facility] Patient [facility] for [facility]ious [facility] Phone: [phone] Fax:  [phone]
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
## 2022-12-15 ENCOUNTER — Telehealth: Payer: Self-pay

## 2022-12-15 NOTE — Transitions of Care (Post Inpatient/ED Visit) (Signed)
   12/15/2022  Name: Monday Amthor MRN: [id number] DOB: 12-Jan-1966  Today's TOC FU Call Status: Today's TOC FU Call Status:: Successful TOC FU Call Competed TOC FU Call Complete Date: 12/15/22  Transition Care Management Follow-up Telephone Call Date of Discharge: 12/14/22 Discharge Facility: [facility] ([facility]) Type of Discharge: Emergency Department Reason for ED Visit: Other: (Injury of right knee) How have you been since you were released from the hospital?: Better Any questions or concerns?: No  Items Reviewed: Did you receive and understand the discharge instructions provided?: Yes Medications obtained and verified?: Yes (Medications Reviewed) Any new allergies since your discharge?: No Dietary orders reviewed?: NA Do you have support at home?: Yes  Home Care and Equipment/Supplies: [facility] Ordered?: NA Any new equipment or medical supplies ordered?: NA  Functional Questionnaire: Do you need assistance with bathing/showering or dressing?: No Do you need assistance with meal preparation?: No Do you need assistance with eating?: No Do you have difficulty maintaining continence: No Do you need assistance with getting out of bed/getting out of a chair/moving?: No Do you have difficulty managing or taking your medications?: No  Folllow up appointments reviewed: PCP Follow-up appointment confirmed?: No (refused) MD Provider Line Number:[phone] Given: Yes [facility] Follow-up appointment confirmed?: No (pt would to make fu with ortho in 5-7 days if not better) Reason Specialist Follow-Up Not Confirmed: Patient has Specialist Provider Number and will Call for Appointment Do you need transportation to your follow-up appointment?: No Do you understand care options if your condition(s) worsen?: Yes-patient verbalized understanding    [place] LPN [facility] Direct Dial [phone]

## 2022-12-16 ENCOUNTER — Encounter: Payer: Self-pay | Admitting: Hematology and Oncology

## 2022-12-18 NOTE — Progress Notes (Unsigned)
12/19/2022 Larimar Pepitone [id number] 11/03/1965  Referring provider: [name], NP Primary GI doctor: Dr. Havery Moros  ASSESSMENT AND PLAN:   Pharyngoesophageal dysphagia with hoarseness, possible LPR, history of thyroid cancer status post RAI therapy and thyroidectomy Patient is having issue with liquids and describes it to be more upper pharyngeal issues however had recent CT abdomen and pelvis 08/2022 that showed distal esophageal thickening. Will start with barium swallow and schedule for EGD with possible dilatation further out, can cancel if not needed Increase Prilosec 40 mg twice daily GERD lifestyle changes discussed with patient I believe she should be able to be done at the [facility], will check labs this visit as patient is very close to ESRD  CKD (chronic kidney disease) stage 4, GFR 15-29 ml/min ([place]) Unable to see notes, last creatinine 4 Will recheck this visit, she is not on dialysis at this time though they have spoken with her about it.  Anemia, unspecified type Likely related to late stage CKD, states she has been receiving iron infusions in the past last one several months ago, continuing to have anemia.   Had normal iron/ferritin 11/2021. Will recheck CBC, iron and ferritin. Recall colon 2030  Essential hypertension Did not take her blood pressure medication this morning, needs to follow-up with primary care, ER precautions given to the patient. No chest pain no shortness of breath at this time  Hepatic steatosis --Continue to work on risk factor modification including diet exercise and control of risk factors including blood sugars. - monitor q 6 months.     Patient Care Team: [name], NP as PCP - General (Internal Medicine) Michel Bickers, MD as PCP - Infectious Diseases (Infectious Diseases) Magrinat, Virgie Dad, MD (Inactive) as Consulting Physician (Oncology) Kyung Rudd, MD as Consulting Physician (Radiation  Oncology) Erroll Luna, MD as Consulting Physician (General Surgery) Alexis Frock, MD as Consulting Physician (Urology) Bensimhon, Shaune Pascal, MD as Consulting Physician (Cardiology) Elmarie Shiley, MD as Consulting Physician (Nephrology) Renato Shin, MD (Inactive) as Consulting Physician (Endocrinology) Alda Berthold, DO as Consulting Physician (Neurology)  HISTORY OF PRESENT ILLNESS: 57 y.o. female with a past medical history of HIV, thyroid cancer s/p RAI and thyroidectomy 2017, breast cancer 2019 s/p chemotherapy, CKD stage 4, type 2 diabetes, hyperlipidemia, hypertension, anxiety and depression,  recurrent falls, renal stones, chronic gout and others listed below presents for evaluation of dysphagia.   11/15/2018 screening colonoscopy good bowel prep multiple small bowel diverticula sigmoid otherwise unremarkable recall 10 years 08/31/2022 CT abdomen pelvis without contrast  no acute abnormality abdomen pelvis, small bilateral pleural effusions with adjacent atelectasis, distal esophageal wall thickening active emesis or esophagitis, hepatomegaly without hepatic steatosis, cholelithiasis without cholecystitis, nonobstructing right renal stone 6 mm  12/05/2022 BUN 44, creatinine 4.1 compared 08/27/2022  BUN 40, Cr 3.00 06/05 HGB 11.2, 07/19 HGB 9.9, 07/21 HGB 8.8 12/05/2022  Hgb 9.8 compared to 08/27/22 HRB 10.9 ,MCV 93.8 12/05/2021  iron 40, iron saturation 18,TIBC 225.  ferritin 475  She had 2 iron infusions with her PCP, Dr. Carlis Abbott   Has not taken her BP medication this AM, BP is 190's.   Patient has been complaining of dysphagia intermittently over the last year progressing.   Not every time she eats or drinks, can be once or twice a week.  Will get stuck in her throat, has to hold her throat and gasp for breath.  Can happen with liquids and solids.  She denies odynophagia.  No AB pain  but states while lying down in [place] after driving there had severe pain that woke her up and  sent her to the ER. She has had some issues since that time with intermittent AB pain.  She is on omeprazole 40 mg daily for the last year, GERD is well controlled.  No melena, no hematochezia.  She had multiple episodes of antibiotics for UTI, is on Flovent for asthma. She has had a new dog, has been going for walks with him but has lost about 15-20 lbs over 3 months per patient, per our records has not had this weight loss.  She has had decreased appetite.   Wt Readings from Last 3 Encounters:  12/19/22 211 lb 8 oz (95.9 kg)  12/14/22 206 lb (93.4 kg)  09/15/22 214 lb 6 oz (97.2 kg)     She  reports that she quit smoking about 5 years ago. Her smoking use included cigarettes. She started smoking about 5 years ago. She has a 7.50 pack-year smoking history. She has never used smokeless tobacco. She reports current alcohol use. She reports that she does not use drugs.  RELEVANT LABS AND IMAGING: CBC    Component Value Date/Time   WBC 8.4 08/31/2022 1213   RBC 3.57 (L) 08/31/2022 1213   HGB 9.6 (A) 12/05/2022 0000   HGB 11.7 (L) 07/27/2021 1110   HCT 33.3 (L) 08/31/2022 1213   PLT 216 08/31/2022 1213   PLT 228 07/27/2021 1110   MCV 93.3 08/31/2022 1213   MCH 31.1 08/31/2022 1213   MCHC 33.3 08/31/2022 1213   RDW 18.7 (H) 08/31/2022 1213   LYMPHSABS 2.4 10/23/2021 1115   MONOABS 0.7 10/23/2021 1115   EOSABS 0.2 10/23/2021 1115   BASOSABS 0.0 10/23/2021 1115   Recent Labs    08/29/22 1254 08/31/22 1213 12/05/22 0000  HGB 10.2* 11.1* 9.6*     CMP     Component Value Date/Time   NA 142 12/05/2022 0000   K 3.6 12/05/2022 0000   CL 106 12/05/2022 0000   CO2 27 (A) 12/05/2022 0000   GLUCOSE 120 (H) 09/23/2022 1321   BUN 44 (A) 12/05/2022 0000   CREATININE 4.1 (A) 12/05/2022 0000   CREATININE 2.86 (H) 09/23/2022 1321   CREATININE 2.72 (H) 07/27/2021 1110   CREATININE 2.74 (H) 03/17/2021 1050   CALCIUM 8.9 12/05/2022 0000   PROT 7.7 08/31/2022 1213   ALBUMIN 3.5  12/05/2022 0000   AST 15 08/31/2022 1213   AST 28 07/27/2021 1110   ALT 11 08/31/2022 1213   ALT 19 07/27/2021 1110   ALKPHOS 72 08/31/2022 1213   BILITOT 0.4 08/31/2022 1213   BILITOT 0.3 07/27/2021 1110   GFRNONAA 19 (L) 08/31/2022 1213   GFRNONAA 20 (L) 07/27/2021 1110   GFRAA 39 (L) 07/14/2020 1053   GFRAA 33 (L) 08/12/2019 1447      Latest Ref Rng & Units 12/05/2022   12:00 AM 08/31/2022   12:13 PM 08/29/2022   12:54 PM  Hepatic Function  Total Protein 6.5 - 8.1 g/dL  7.7  6.6   Albumin 3.5 - 5.0 3.5     3.4  3.2   AST 15 - 41 U/L  15  15   ALT 0 - 44 U/L  11  11   Alk Phosphatase 38 - 126 U/L  72  77   Total Bilirubin 0.3 - 1.2 mg/dL  0.4  0.4      This result is from an external source.  Current Medications:   Current Outpatient Medications (Endocrine & Metabolic):    glipiZIDE (GLUCOTROL) 10 MG tablet, TAKE 1 TABLET BY MOUTH TWICE A DAY FOR DIABETES   levothyroxine (SYNTHROID) 175 MCG tablet, TAKE 1 TABLET BY MOUTH EVERY DAY   Current Outpatient Medications (Cardiovascular):    candesartan (ATACAND) 32 MG tablet, TAKE 1 TABLET BY MOUTH DAILY.   carvedilol (COREG) 25 MG tablet, Take 1 tablet (25 mg total) by mouth 2 (two) times daily.   hydrALAZINE (APRESOLINE) 50 MG tablet, TAKE 1 TABLET (50 MG) BY MOUTH IN THE MORNING AND AT BEDTIME   rosuvastatin (CRESTOR) 10 MG tablet, TAKE 1 TABLET BY MOUTH EVERY DAY FOR CHOLESTEROL   Current Outpatient Medications (Respiratory):    albuterol (VENTOLIN HFA) 108 (90 Base) MCG/ACT inhaler, Inhale 2 puffs into the lungs every 6 (six) hours as needed for wheezing or shortness of breath.   cetirizine (ZYRTEC) 10 MG tablet, TAKE 1 TABLET BY MOUTH AT BEDTIME FOR ALLERGIES   FLOVENT HFA 110 MCG/ACT inhaler, TAKE 1 PUFF BY MOUTH TWICE A DAY (Patient taking differently: Inhale 1 puff into the lungs 2 (two) times daily as needed (wheezing/shortness of breath).)   fluticasone (FLONASE) 50 MCG/ACT nasal spray, Place 2 sprays into  both nostrils daily.   Current Outpatient Medications (Analgesics):    acetaminophen (TYLENOL) 500 MG tablet, Take 1,000 mg by mouth every 6 (six) hours as needed for mild pain.   allopurinol (ZYLOPRIM) 100 MG tablet, TAKE 1 TABLET BY MOUTH EVERY DAY FOR GOUT PREVENTION     Current Outpatient Medications (Other):    Blood Glucose Monitoring Suppl (FREESTYLE LITE) w/Device KIT, Inject 1 each into the skin daily.   dolutegravir ([name]) 50 MG tablet, Take 1 tablet (50 mg total) by mouth daily.   emtricitabine-tenofovir AF (DESCOVY) 200-25 MG tablet, Take 1 tablet by mouth daily.   gabapentin (NEURONTIN) 300 MG capsule, Take 300 mg by mouth daily.   glucose blood (ONETOUCH VERIO) test strip, USE AS INSTRUCTED TO TEST BLOOD SUGAR up to 3 times daily.   Lancets (ONETOUCH ULTRASOFT) lancets, Use as instructed to test blood sugar daily   minocycline (DYNACIN) 100 MG tablet, Take 1 tablet (100 mg total) by mouth 2 (two) times daily.   sertraline (ZOLOFT) 50 MG tablet, TAKE 1 TABLET (50 MG TOTAL) BY MOUTH DAILY. FOR ANXIETY AND DEPRESSION.   tamoxifen (NOLVADEX) 20 MG tablet, Take 1 tablet (20 mg total) by mouth daily.   omeprazole (PRILOSEC) 40 MG capsule, Take 1 capsule (40 mg total) by mouth 2 (two) times daily before a meal.   Facility-Administered Medications Ordered in Other Visits (Other):    sodium chloride flush (NS) 0.9 % injection 10 mL No current facility-administered medications for this visit.  Medical History:  Past Medical History:  Diagnosis Date   Acute pancreatitis 10/23/2021   Acute renal failure superimposed on stage 4 chronic kidney disease ([place]) 10/24/2021   Allergy    Anemia    Normocytic   Antibiotic-induced yeast infection 09/28/2020   Anxiety    Asthma    Blood dyscrasia    Bronchitis 2005   Bursitis of left shoulder 09/06/2019   CKD (chronic kidney disease)    CLASS 1-EXOPHTHALMOS-THYROTOXIC 02/08/2007   COVID-19 virus infection 04/28/2022   Diabetes  mellitus without complication ([place])    Family history of breast cancer    Family history of lung cancer    Family history of prostate cancer    Gastroenteritis 07/10/2007   Genetic testing 07/25/2018  [employer] + RNA Insight was ordered.  Genes Analyzed (43 total): APC*, ATM*, AXIN2, BARD1, BMPR1A, BRCA1*, BRCA2*, BRIP1*, CDH1*, CDK4, CDKN2A, CHEK2*, DICER1, GALNT12, HOXB13, MEN1, MLH1*, MRE11A, MSH2*, MSH3, MSH6*, MUTYH*, NBN, NF1*, NTHL1, PALB2*, PMS2*, POLD1, POLE, PTEN*, RAD50, RAD51C*, RAD51D*, RET, SDHB, SDHD, SMAD4, SMARCA4, STK11 and TP53* (sequencing and deletion/duplication); EGFR (s   GERD 07/24/2006   GRAVE'S DISEASE 01/01/2008   History of hidradenitis suppurativa    History of kidney stones    History of thrush    HIV DISEASE 07/24/2006   dx March 05   Hyperlipidemia    HYPERTENSION 07/24/2006   Hyperthyroidism 08/2006   Grave's Disease -diffuse radiotracer uptake 08/25/06 Thyroid scan-Cold nodule to R lower lobe of thyrorid   Menometrorrhagia    hx of   Nephrolithiasis    Panniculitis 05/12/2020   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 Completed 2 yrs Prednisone after lung bx confirmation. No symptoms since then.   Sebaceous cyst 04/21/2020   Suppurative hidradenitis    Thyroid cancer ([place])    THYROID NODULE, RIGHT 02/08/2007   Allergies:  Allergies  Allergen Reactions   Genvoya [Elviteg-Cobic-Emtricit-Tenofaf] Hives   Lisinopril Cough   Tape Rash    Rash  Okay with tegaderm and paper tape   Tegaderm  Ag Mesh [Silver] Itching     Surgical History:  She  has a past surgical history that includes Total thyroidectomy (2010); [place] (1994); Dilation and curettage of uterus (11/2002); Incision and drainage peritonsillar abscess (12/2001); Incise and drain abcess (08/2002); Irrigation and debridement abscess (01/31/2012); Eye surgery; Nephrolithotomy (10/02/2012); Cystoscopy with stent placement (10/02/2012); Nephrolithotomy (10/04/2012); Nephrolithotomy (10/08/2012); Nephrolithotomy (10/11/2012); Cystoscopy with retrograde pyelogram, ureteroscopy and stent placement (11/09/2012); Cystoscopy w/ ureteral stent removal (11/09/2012); Breast surgery (1997); Thyroidectomy (04/12/2017); Radical neck dissection (04/12/2017); Thyroidectomy (N/A, 04/12/2017); Radical neck dissection (N/A, 04/12/2017); Reduction mammaplasty (Bilateral, 1998); Portacath placement (Left, 05/17/2018); IR CV Line Injection (06/07/2018); IR REMOVAL TUN ACCESS W/ PORT W/O FL MOD SED (06/20/2018); IR IMAGING GUIDED PORT INSERTION (06/20/2018); Breast lumpectomy with radioactive seed and sentinel lymph node biopsy (Right, 10/03/2018); RIGHT/LEFT HEART CATH AND CORONARY ANGIOGRAPHY (N/A, 03/12/2020); Breast lumpectomy (Right, 10/03/2018); Breast excisional biopsy (Right, 04/26/2018); Breast excisional biopsy (Left, 04/26/2018); Port-a-cath removal (N/A, 07/16/2020); Panniculectomy (N/A, 12/21/2020); Debridement and closure wound (N/A, 01/20/2021); Application if wound vac (N/A, 01/20/2021); and Percutaneous nephrostolithotomy (04/2022). Family History:  Her family history includes Breast cancer in her cousin, cousin, cousin, maternal aunt, paternal aunt, and paternal aunt; Breast cancer (age of onset: 53) in her cousin; Breast cancer (age of onset: 86) in her paternal aunt; Breast cancer (age of onset: 54) in her maternal aunt; Cancer in her maternal uncle and mother; Diabetes in an other family member; Heart disease in her mother and another family  member; Hypertension in her brother, father, mother, sister, sister, and another family member; Kidney disease in an other family member; Lung cancer in her paternal uncle; Lung cancer (age of onset: 60) in her father; Pancreatic cancer in her father; Prostate cancer in her paternal uncle and paternal  uncle; Stroke in an other family member.  REVIEW OF SYSTEMS  : All other systems reviewed and negative except where noted in the History of Present Illness.  PHYSICAL EXAM: BP (!) 192/90   Pulse 80   Ht '5\' 5"'$  (1.651 m)   Wt 211 lb 8 oz (95.9 kg)   LMP 03/31/2014 (LMP Unknown)   SpO2 100%   BMI 35.20 kg/m  General Appearance: Well nourished, in no apparent distress. Head:   Normocephalic and atraumatic. Eyes:  sclerae anicteric,conjunctive pink  Respiratory: Respiratory effort normal, BS equal bilaterally without rales, rhonchi, wheezing. Cardio: RRR with no MRGs. Peripheral pulses intact.  Abdomen: Soft,  Obese ,active bowel sounds. mild tenderness in the epigastrium. Without guarding and Without rebound. No masses. Rectal: Not evaluated Musculoskeletal: Full ROM, Normal gait. Without edema. Skin:  Dry and intact without significant lesions or rashes Neuro: Alert and  oriented x4;  No focal deficits. Psych:  Cooperative. Normal mood and affect.    Vladimir Crofts, PA-C 3:21 PM

## 2022-12-19 ENCOUNTER — Telehealth: Payer: Self-pay

## 2022-12-19 ENCOUNTER — Other Ambulatory Visit (INDEPENDENT_AMBULATORY_CARE_PROVIDER_SITE_OTHER): Payer: Commercial Managed Care - HMO

## 2022-12-19 ENCOUNTER — Ambulatory Visit (INDEPENDENT_AMBULATORY_CARE_PROVIDER_SITE_OTHER): Payer: Commercial Managed Care - HMO | Admitting: Physician Assistant

## 2022-12-19 ENCOUNTER — Encounter: Payer: Self-pay | Admitting: Physician Assistant

## 2022-12-19 VITALS — BP 192/90 | HR 80 | Ht 65.0 in | Wt 211.5 lb

## 2022-12-19 DIAGNOSIS — N184 Chronic kidney disease, stage 4 (severe): Secondary | ICD-10-CM

## 2022-12-19 DIAGNOSIS — D649 Anemia, unspecified: Secondary | ICD-10-CM

## 2022-12-19 DIAGNOSIS — K760 Fatty (change of) liver, not elsewhere classified: Secondary | ICD-10-CM

## 2022-12-19 DIAGNOSIS — C73 Malignant neoplasm of thyroid gland: Secondary | ICD-10-CM

## 2022-12-19 DIAGNOSIS — K219 Gastro-esophageal reflux disease without esophagitis: Secondary | ICD-10-CM

## 2022-12-19 DIAGNOSIS — R1314 Dysphagia, pharyngoesophageal phase: Secondary | ICD-10-CM

## 2022-12-19 DIAGNOSIS — R490 Dysphonia: Secondary | ICD-10-CM

## 2022-12-19 DIAGNOSIS — I10 Essential (primary) hypertension: Secondary | ICD-10-CM

## 2022-12-19 LAB — COMPREHENSIVE METABOLIC PANEL
ALT: 9 U/L (ref 0–35)
AST: 16 U/L (ref 0–37)
Albumin: 3.2 g/dL — ABNORMAL LOW (ref 3.5–5.2)
Alkaline Phosphatase: 98 U/L (ref 39–117)
BUN: 41 mg/dL — ABNORMAL HIGH (ref 6–23)
CO2: 30 mEq/L (ref 19–32)
Calcium: 9.2 mg/dL (ref 8.4–10.5)
Chloride: 101 mEq/L (ref 96–112)
Creatinine, Ser: 4.26 mg/dL — ABNORMAL HIGH (ref 0.40–1.20)
GFR: 11.21 mL/min — CL (ref >60.00)
Glucose, Bld: 115 mg/dL — ABNORMAL HIGH (ref 70–99)
Potassium: 3.4 mEq/L — ABNORMAL LOW (ref 3.5–5.1)
Sodium: 140 mEq/L (ref 135–145)
Total Bilirubin: 0.3 mg/dL (ref 0.2–1.2)
Total Protein: 7.5 g/dL (ref 6.0–8.3)

## 2022-12-19 LAB — CBC WITH DIFFERENTIAL/PLATELET
Basophils Absolute: 0 10*3/uL (ref 0.0–0.1)
Basophils Relative: 0.4 % (ref 0.0–3.0)
Eosinophils Absolute: 0.2 10*3/uL (ref 0.0–0.7)
Eosinophils Relative: 2 % (ref 0.0–5.0)
HCT: 28.2 % — ABNORMAL LOW (ref 36.0–46.0)
Hemoglobin: 9.4 g/dL — ABNORMAL LOW (ref 12.0–15.0)
Lymphocytes Relative: 20.4 % (ref 12.0–46.0)
Lymphs Abs: 1.6 10*3/uL (ref 0.7–4.0)
MCHC: 33.5 g/dL (ref 30.0–36.0)
MCV: 98.1 fl (ref 78.0–100.0)
Monocytes Absolute: 0.7 10*3/uL (ref 0.1–1.0)
Monocytes Relative: 8.7 % (ref 3.0–12.0)
Neutro Abs: 5.3 10*3/uL (ref 1.4–7.7)
Neutrophils Relative %: 68.5 % (ref 43.0–77.0)
Platelets: 248 10*3/uL (ref 150.0–400.0)
RBC: 2.87 Mil/uL — ABNORMAL LOW (ref 3.87–5.11)
RDW: 18.1 % — ABNORMAL HIGH (ref 11.5–15.5)
WBC: 7.7 10*3/uL (ref 4.0–10.5)

## 2022-12-19 LAB — IBC + FERRITIN
Ferritin: 267.5 ng/mL (ref 10.0–291.0)
Iron: 47 ug/dL (ref 42–145)
Saturation Ratios: 18.1 % — ABNORMAL LOW (ref 20.0–50.0)
TIBC: 259 ug/dL (ref 250.0–450.0)
Transferrin: 185 mg/dL — ABNORMAL LOW (ref 212.0–360.0)

## 2022-12-19 MED ORDER — OMEPRAZOLE 40 MG PO CPDR: 40.0000 mg | DELAYED_RELEASE_CAPSULE | Freq: Two times a day (BID) | ORAL | 0 refills | Status: DC

## 2022-12-19 NOTE — Patient Instructions (Addendum)
Your provider has requested that you go to the basement level for lab work before leaving today. Press "B" on the elevator. The lab is located at the first door on the left as you exit the elevator.  You have been scheduled for a Barium Esophogram at [facility] (1st floor of the hospital) on 01/04/2023 at 10:00am. Please arrive 30 minutes prior to your appointment for registration. Make certain not to have anything to eat or drink 3 hours prior to your test. If you need to reschedule for any reason, please contact radiology at [phone] to do so. __________________________________________________________________ A barium swallow is an examination that concentrates on views of the esophagus. This tends to be a double contrast exam (barium and two liquids which, when combined, create a gas to distend the wall of the oesophagus) or single contrast (non-ionic iodine based). The study is usually tailored to your symptoms so a good history is essential. Attention is paid during the study to the form, structure and configuration of the esophagus, looking for functional disorders (such as aspiration, dysphagia, achalasia, motility and reflux) EXAMINATION You may be asked to change into a gown, depending on the type of swallow being performed. A radiologist and radiographer will perform the procedure. The radiologist will advise you of the type of contrast selected for your procedure and direct you during the exam. You will be asked to stand, sit or lie in several different positions and to hold a small amount of fluid in your mouth before being asked to swallow while the imaging is performed .In some instances you may be asked to swallow barium coated marshmallows to assess the motility of a solid food bolus. The exam can be recorded as a digital or video fluoroscopy procedure. POST PROCEDURE It will take 1-2 days for the barium to pass through your system. To facilitate this, it is important, unless  otherwise directed, to increase your fluids for the next 24-48hrs and to resume your normal diet.  This test typically takes about 30 minutes to perform. __________________________________________________________________________________    Dysphagia precautions:  1. Take reflux medications 30+ minutes before food in the morning- will increase to twice a day 2. Begin meals with warm beverage 3. Eat smaller more frequent meals 4. Eat slowly, taking small bites and sips 5. Alternate solids and liquids 6. Avoid foods/liquids that increase acid production 7. Sit upright during and for 30+ minutes after meals to facilitate esophageal clearing 8. All meats should be chopped finely.   If something gets hung in your esophagus and will not come up or go down, proceed to the emergency room.     Silent reflux: Not all heartburn burns...Marland KitchenMarland KitchenMarland Kitchen  What is LPR? Laryngopharyngeal reflux (LPR) or silent reflux is a condition in which acid that is made in the stomach travels up the esophagus (swallowing tube) and gets to the throat. Not everyone with reflux has a lot of heartburn or indigestion. In fact, many people with LPR never have heartburn. This is why LPR is called SILENT REFLUX, and the terms "Silent reflux" and "LPR" are often used interchangeably. Because LPR is silent, it is sometimes difficult to diagnose.  How can you tell if you have LPR?  Chronic hoarseness- Some people have hoarseness that comes and goes throat clearing  Cough It can cause shortness of breath and cause asthma like symptoms. a feeling of a lump in the throat  difficulty swallowing a problem with too much nose and throat drainage.  Some people will  feel their esophagus spasm which feels like their heart beating hard and fast, this will usually be after a meal, at rest, or lying down at night.    How do I treat this? Treatment for LPR should be individualized, and your doctor will suggest the best treatment for you.  Generally there are several treatments for LPR: changing habits and diet to reduce reflux,  medications to reduce stomach acid, and  surgery to prevent reflux. Most people with LPR need to modify how and when they eat, as well as take some medication, to get well. Sometimes, nonprescription liquid antacids, such as Maalox, Gelucil and Mylanta are recommended. When used, these antacids should be taken four times each day - one tablespoon one hour after each meal and before bedtime. Dietary and lifestyle changes alone are not often enough to control LPR - medications that reduce stomach acid are also usually needed. These must be prescribed by our doctor.   TIPS FOR REDUCING REFLUX AND LPR Control your LIFE-STYLE and your DIET! If you use tobacco, QUIT.  Smoking makes you reflux. After every cigarette you have some LPR.  Don't wear clothing that is too tight, especially around the waist (trousers, corsets, belts).  Do not lie down just after eating...in fact, do not eat within three hours of bedtime.  You should be on a low-fat diet.  Limit your intake of red meat.  Limit your intake of butter.  Avoid fried foods.  Avoid chocolate  Avoid cheese.  Avoid eggs. Specifically avoid caffeine (especially coffee and tea), soda pop (especially cola) and mints.  Avoid alcoholic beverages, particularly in the evening. You have been scheduled for an endoscopy. Please follow written instructions given to you at your visit today. If you use inhalers (even only as needed), please bring them with you on the day of your procedure.   Due to recent changes in healthcare laws, you may see the results of your imaging and laboratory studies on MyChart before your provider has had a chance to review them.  We understand that in some cases there may be results that are confusing or concerning to you. Not all laboratory results come back in the same time frame and the provider may be waiting for multiple results  in order to interpret others.  Please give [place] 48 hours in order for your provider to thoroughly review all the results before contacting the office for clarification of your results.    I appreciate the  opportunity to care for you  Thank You   [facility]

## 2022-12-19 NOTE — Telephone Encounter (Signed)
noted 

## 2022-12-19 NOTE — Telephone Encounter (Signed)
Received call from Lab.  Critical Value.  GFR 11.21

## 2022-12-26 NOTE — Progress Notes (Unsigned)
Patient ID: [name], female   DOB: 1966/08/21, 57 y.o.   MRN: [id number]  Virtual Visit via Video Note  Patient location: Home My location: Office  Participants: patient, provider  I connected with [name] on 12/27/22 at  AM EDT by a video enabled telemedicine application and verified that I am speaking with the correct person using two identifiers.   I discussed the limitations of evaluation and management by telemedicine and the availability of in person appointments. The patient expressed understanding and agreed to proceed.  HPI  [name] is a 57 y.o.-year-old female, returning for follow-up for thyroid cancer and uncontrolled postsurgical hypothyroidism.  She previously saw Dr. Loanne Drilling, last visit 10/2021.  Last visit with me 6 months ago. This appointment was done virtually as patient woke up this morning with viral symptoms.  Interim history: She has upper respiratory congestion.  Some cough.  She did not check her temperature. No palpitations, heat intolerance or anxiety. Her food gets stuck in throat >> will have an EGD soon.  I reviewed her thyroid cancer hx. -Per Dr. Cordelia Pen note and review of the chart: 2007 thyr NM scan consistent with Graves disease, except for 1 cold area 2008 Dominant 2.5 cm solid nodule in the inferior right lobe, corresponding to cold defect on NM. 2008 thyroid bx was suspicious.  2009 thyroidectomy (right lobectomy) --pathol c/w Grave's dz, with adenomatous nodule.  2/18 CT Multiple enlarged bilateral cervical lymph nodes. Residual 10 mm nodule at the thyroidectomy bed with similar nodules superior to the thyroid on the left, concerning for residual or recurrent thyroid tissue. Neoplasm is not excluded.  Single low-density noted in the posterior left level 2 station of undetermined significance. 3/18 LYMPH NODE , FNA, LEFT CERVICAL, STRONGLY FAVORS METASTATIC PTC: LYMPH NODE , FINE NEEDLE ASPIRATION LEFT CERVICAL NECK  (SPECIMEN 2 OF 2 COLLECTED 01/12/2017) METASTATIC CARCINOMA. SEE COMMENT. COMMENT: THE FINDINGS STRONGLY FAVOR METASTATIC PAPILLARY THYROID CARCINOMA. DR. Herbie Baltimore HILLARD HAS REVIEWED THE CASE AND CONCURS WITH THIS INTERPRETATION. DR. Redmond Baseman WAS PAGED ON 01/13/2017. Specimen Clinical Information Left cervical lymph node, Patient status post thyroidectomy 2008 Source Lymph Node, Fine Needle Aspiration, Left Cervical, (Specimen 2 of 2, collected on 01/12/17 ) 3/18 THYROID, FINE NEEDLE ASPIRATION, LEFT [place] CATEGORY II: THYROID, FINE NEEDLE ASPIRATION, LEFT (SPECIMEN 1 OF 2 COLLECTED 01/12/2017) CONSISTENT WITH BENIGN FOLLICULAR TISSUE (B[place] CATEGORY II). Specimen Clinical Information Nodule within bed of left thyroid, Post thyroidectomy 2008 Source Thyroid, Fine Needle Aspiration, Left, (Specimen 1 of 2, collected on 01/12/17 ) 5/18 NM scan: Expected tissue within the thyroid bed. There is a second adjacent smaller focus of less intense uptake.   6/18 left thyroid lobectomy (completion thyroidectomy), and LN's, Left zone 3 Neck Contents.  NO MALIGNANCY.  Dr. Melida Quitter: 1. Thyroid, lobectomy, Left - NODULAR HYPERPLASIA. - NO MALIGNANCY IDENTIFIED. 2. Lymph nodes, regional resection, Left zone 3 Neck Contents - BENIGN THYROID WITH NODULAR HYPERPLASIA. - NO LYMPH NODE TISSUE IDENTIFIED. - NO MALIGNANCY IDENTIFIED. 8/18 NM scan: abnormal focus in the right side of neck, suspicious for nodal metastasis.   9/18 [place]: 1 non pathologically enlarged lymph node.  10/21 [place]: normal (thyroidect) 9/22 [place]: 1. Surgical changes of total thyroidectomy without evidence of residual or recurrent thyroid tissue. 2. Bilateral prominent and borderline enlarged cervical chain lymph nodes. The node previously identified on the left remains unchanged at 0.4 cm in short axis. However, at least 1 additional new and slightly enlarged lymph node is now identified on both  the left and the right. Differential  considerations include reactive adenopathy, or less likely an early lymphoproliferative process, or metastatic disease. Reactive adenopathy is favored. However, continued clinical surveillance is recommended. If there is biochemical evidence of recurrent thyroid cancer, or if the lymph nodes demonstrate enlargement over time, repeat imaging and possibly tissue sampling may become warranted. 01/06/2022: RAI treatment with 108.2 mCi I-131 01/13/2022: Posttreatment whole-body scan: Focus of abnormal tracer in the right superior cervical region likely a lymph node metastasis  Lab Results  Component Value Date   THYROGLB 0.6 (L) 08/23/2022   THYROGLB 2.2 (L) 03/01/2022   Lab Results  Component Value Date   THGAB <1 08/23/2022   THGAB <1 03/01/2022   Pt denies: - feeling nodules in neck - hoarseness - choking But does have dysphagia.  Seeing GI.  Postsurgical hypothyroidism -In 02/2022, she misunderstood instructions and she was on LT4 125 mcg (not taking 2x at a time as recommended!). At that time, I advised her to increase the dose to 175 mcg daily -In 08/2022, she again misunderstood instructions and she is actually taking 2x 175 mcg LT4 daily!!! Moreover, she did not return for labs in 5 to 6 weeks as advised at last visit so she was on this very high dose of LT4 for 4 months!  I advised her to stop the levothyroxine for 2 days and then resume 175 mcg daily.  She takes levothyroxine - daily, no missed doses - fasting - with water - separated by 30 min-1h from b'fast  - no calcium, iron - + multivitamins 30 min to 1h after LT4 >> moved ~3h later - + Omeprazole at bedtime  I reviewed pt's thyroid tests: Lab Results  Component Value Date   TSH 2.61 08/23/2022   TSH 29.49 (H) 03/01/2022   TSH 54.58 (H) 11/02/2021   TSH 23.96 (H) 07/01/2021   TSH 20.04 (H) 04/30/2021   TSH 6.46 (H) 07/21/2020   TSH 17.400 (H) 01/27/2020   TSH 17.98 (H) 07/11/2019   TSH 34.55 (H) 06/05/2019    TSH 2.69 08/24/2018   FREET4 1.49 08/23/2022   FREET4 0.59 (L) 03/01/2022   FREET4 0.55 (L) 11/02/2021   FREET4 0.75 07/01/2021   FREET4 1.0 04/30/2021   FREET4 1.05 07/21/2020   FREET4 0.94 03/08/2018   FREET4 1.03 10/12/2017   FREET4 1.56 09/01/2017   FREET4 0.87 (L) 06/05/2007    She previously mentioned: - fatigue - weight gain - cold intolerance - no constipation - dry skin - no hair loss  She has + FH of thyroid disorders in: sister. No FH of thyroid cancer.  No h/o radiation tx to head or neck besides RAI treatment.  She also has a history of HIV, BrCA - s/p RxTx, DM2, HL, sarcoidosis.  ROS: + see HPI  Past Medical History:  Diagnosis Date   Acute pancreatitis 10/23/2021   Acute renal failure superimposed on stage 4 chronic kidney disease ([place]) 10/24/2021   Allergy    Anemia    Normocytic   Antibiotic-induced yeast infection 09/28/2020   Anxiety    Asthma    Blood dyscrasia    Bronchitis 2005   Bursitis of left shoulder 09/06/2019   CKD (chronic kidney disease)    CLASS 1-EXOPHTHALMOS-THYROTOXIC 02/08/2007   COVID-19 virus infection 04/28/2022   Diabetes mellitus without complication (HCC)    Family history of breast cancer    Family history of lung cancer    Family history of prostate cancer    Gastroenteritis  07/10/2007   Genetic testing 07/25/2018   [employer] + RNA Insight was ordered.  Genes Analyzed (43 total): APC*, ATM*, AXIN2, BARD1, BMPR1A, BRCA1*, BRCA2*, BRIP1*, CDH1*, CDK4, CDKN2A, CHEK2*, DICER1, GALNT12, HOXB13, MEN1, MLH1*, MRE11A, MSH2*, MSH3, MSH6*, MUTYH*, NBN, NF1*, NTHL1, PALB2*, PMS2*, POLD1, POLE, PTEN*, RAD50, RAD51C*, RAD51D*, RET, SDHB, SDHD, SMAD4, SMARCA4, STK11 and TP53* (sequencing and deletion/duplication); EGFR (s   GERD 07/24/2006   GRAVE'S DISEASE 01/01/2008   History of hidradenitis suppurativa    History of kidney stones    History of thrush    HIV DISEASE 07/24/2006   dx March 05   Hyperlipidemia    HYPERTENSION  07/24/2006   Hyperthyroidism 08/2006   Grave's Disease -diffuse radiotracer uptake 08/25/06 Thyroid scan-Cold nodule to R lower lobe of thyrorid   Menometrorrhagia    hx of   Nephrolithiasis    Panniculitis 05/12/2020   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   Personal history of chemotherapy    2020   Personal history of radiation therapy    2020   Pneumonia 2005   Port-A-Cath in place 07/12/2018   Postsurgical hypothyroidism 03/20/2011   Rash 02/16/2012   Sarcoidosis 02/08/2007   dx as a teenager in [place] from abnl CXR. Completed 2 yrs Prednisone after lung bx confirmation. No symptoms since then.   Sebaceous cyst 04/21/2020   Suppurative hidradenitis    Thyroid cancer ([place])    THYROID NODULE, RIGHT 02/08/2007   Past Surgical History:  Procedure Laterality Date   APPLICATION OF WOUND VAC N/A 01/20/2021   Procedure: APPLICATION OF WOUND VAC;  Surgeon: Wallace Going, DO;  Location: [place];  Service: Plastics;  Laterality: N/A;   BREAST EXCISIONAL BIOPSY Right 04/26/2018   right axilla negative   BREAST EXCISIONAL BIOPSY Left 04/26/2018   left axilla negative   BREAST LUMPECTOMY Right 10/03/2018   malignant   BREAST LUMPECTOMY WITH RADIOACTIVE SEED AND SENTINEL LYMPH NODE BIOPSY Right 10/03/2018   Procedure: RIGHT BREAST LUMPECTOMY WITH RADIOACTIVE SEED AND SENTINEL LYMPH NODE MAPPING;  Surgeon: Erroll Luna, MD;  Location: [facility];  Service: General;  Laterality: Right;   BREAST SURGERY  1997   Breast Reduction    CYSTOSCOPY W/ URETERAL STENT REMOVAL  11/09/2012   Procedure: CYSTOSCOPY WITH STENT REMOVAL;  Surgeon: Alexis Frock, MD;  Location: [facility];  Service: Urology;  Laterality: Right;   CYSTOSCOPY WITH RETROGRADE PYELOGRAM, URETEROSCOPY AND STENT PLACEMENT   11/09/2012   Procedure: CYSTOSCOPY WITH RETROGRADE PYELOGRAM, URETEROSCOPY AND STENT PLACEMENT;  Surgeon: Alexis Frock, MD;  Location: [facility];  Service: Urology;  Laterality: Left;  LEFT URETEROSCOPY, STONE MANIPULATION, left STENT exchange    CYSTOSCOPY WITH STENT PLACEMENT  10/02/2012   Procedure: CYSTOSCOPY WITH STENT PLACEMENT;  Surgeon: Alexis Frock, MD;  Location: [facility];  Service: Urology;  Laterality: Left;   DEBRIDEMENT AND CLOSURE WOUND N/A 01/20/2021   Procedure: Excision of abdominal wound with closure;  Surgeon: Wallace Going, DO;  Location: [facility];  Service: Plastics;  Laterality: N/A;   DILATION AND CURETTAGE OF UTERUS  11/2002   s/p for 1st trimester nonviable pregnancy   EYE SURGERY     sty under eyelid   INCISE AND DRAIN ABCESS  08/2002   s/p I &D for righ inframmary fold hidradenitis   INCISION AND DRAINAGE PERITONSILLAR ABSCESS  12/2001  IR CV LINE INJECTION  06/07/2018   IR IMAGING GUIDED PORT INSERTION  06/20/2018   IR REMOVAL TUN ACCESS W/ PORT W/O FL MOD SED  06/20/2018   IRRIGATION AND DEBRIDEMENT ABSCESS  01/31/2012   Procedure: IRRIGATION AND DEBRIDEMENT ABSCESS;  Surgeon: Shann Medal, MD;  [facility];  Service: General;  Laterality: Right;  right breast and axilla    NEPHROLITHOTOMY  10/02/2012   Procedure: NEPHROLITHOTOMY PERCUTANEOUS;  Surgeon: Alexis Frock, MD;  [facility];  Service: Urology;  Laterality: Right;  First Stage Percutaneous Nephrolithotomy with Surgeon Access, Left Ureteral Stent     NEPHROLITHOTOMY  10/04/2012   Procedure: NEPHROLITHOTOMY PERCUTANEOUS SECOND LOOK;  Surgeon: Alexis Frock, MD;  [facility];  Service: Urology;  Laterality: Right;      NEPHROLITHOTOMY  10/08/2012   Procedure: NEPHROLITHOTOMY PERCUTANEOUS;  Surgeon: Alexis Frock, MD;  [facility];  Service: Urology;  Laterality: Right;  THIRD STAGE, nephrostomy tube exchange x 2   NEPHROLITHOTOMY  10/11/2012   Procedure:  NEPHROLITHOTOMY PERCUTANEOUS SECOND LOOK;  Surgeon: Alexis Frock, MD;  [facility];  Service: Urology;  Laterality: Right;  RIGHT 4 STAGE PERCUTANOUS NEPHROLITHOTOMY, right URETEROSCOPY WITH HOLMIUM LASER    PANNICULECTOMY N/A 12/21/2020   Procedure: PANNICULECTOMY;  Surgeon: Wallace Going, DO;  Location: [facility];  Service: Plastics;  Laterality: N/A;   PERCUTANEOUS NEPHROSTOLITHOTOMY  04/2022   PORT-A-CATH REMOVAL N/A 07/16/2020   Procedure: REMOVAL PORT-A-CATH;  Surgeon: Erroll Luna, MD;  Location: [facility];  Service: General;  Laterality: N/A;   PORTACATH PLACEMENT Left 05/17/2018   Procedure: INSERTION PORT-A-CATH;  Surgeon: Coralie Keens, MD;  Location: [facility];  Service: General;  Laterality: Left;   RADICAL NECK DISSECTION  04/12/2017   limited/notes 04/12/2017   RADICAL NECK DISSECTION N/A 04/12/2017   Procedure: RADICAL NECK DISSECTION;  Surgeon: Melida Quitter, MD;  Location: [facility];  Service: ENT;  Laterality: N/A;  limited neck dissection 2 hours total   REDUCTION MAMMAPLASTY Bilateral 1998   RIGHT/LEFT HEART CATH AND CORONARY ANGIOGRAPHY N/A 03/12/2020   Procedure: RIGHT/LEFT HEART CATH AND CORONARY ANGIOGRAPHY;  Surgeon: Jolaine Artist, MD;  Location: [facility];  Service: Cardiovascular;  Laterality: N/A;   [place]  04/12/2017   completion/notes 04/12/2017   THYROIDECTOMY N/A 04/12/2017   Procedure: THYROIDECTOMY;  Surgeon: Melida Quitter, MD;  Location: [place] [facility] [place];  Service: ENT;  Laterality: N/A;  Completion Thyroidectomy   TOTAL THYROIDECTOMY  2010   Social History   Socioeconomic History   Marital status: Single    Spouse name: Not on file   Number of children: 1   Years of education: Not on file   Highest education level: Not on file  Occupational History   Occupation: Secondary school teacher  Tobacco Use   Smoking status: Former    Packs/day: 0.50    Years: 15.00    Total pack years: 7.50     Types: Cigarettes    Start date: 04/12/2017    Quit date: 2019    Years since quitting: 5.1   Smokeless tobacco: Never  Vaping Use   Vaping Use: Never used  Substance and Sexual Activity   Alcohol use: Yes    Alcohol/week: 0.0 standard drinks of alcohol    Comment: social   Drug use: No   Sexual activity: Not Currently    Birth control/protection: Post-menopausal    Comment: declined condoms  Other Topics Concern   Not on  file  Social History Narrative   Right Handed    Lives in a two story home   Social Determinants of Health   Financial Resource Strain: Not on file  Food Insecurity: Not on file  Transportation Needs: No Transportation Needs (10/25/2018)   PRAPARE - Hydrologist (Medical): No    Lack of Transportation (Non-Medical): No  Physical Activity: Not on file  Stress: Not on file  Social Connections: Not on file  Intimate Partner Violence: Not At Risk (10/25/2018)   Humiliation, Afraid, Rape, and Kick questionnaire    Fear of Current or Ex-Partner: No    Emotionally Abused: No    Physically Abused: No    Sexually Abused: No   Current Outpatient Medications on File Prior to Visit  Medication Sig Dispense Refill   acetaminophen (TYLENOL) 500 MG tablet Take 1,000 mg by mouth every 6 (six) hours as needed for mild pain.     albuterol (VENTOLIN HFA) 108 (90 Base) MCG/ACT inhaler Inhale 2 puffs into the lungs every 6 (six) hours as needed for wheezing or shortness of breath. 8 g 2   allopurinol (ZYLOPRIM) 100 MG tablet TAKE 1 TABLET BY MOUTH EVERY DAY FOR GOUT PREVENTION 90 tablet 1   Blood Glucose Monitoring Suppl (FREESTYLE LITE) w/Device KIT Inject 1 each into the skin daily. 1 kit 0   candesartan (ATACAND) 32 MG tablet TAKE 1 TABLET BY MOUTH DAILY. 30 tablet 5   carvedilol (COREG) 25 MG tablet Take 1 tablet (25 mg total) by mouth 2 (two) times daily. 180 tablet 3   cetirizine (ZYRTEC) 10 MG tablet TAKE 1 TABLET BY MOUTH AT BEDTIME FOR  ALLERGIES 90 tablet 0   dolutegravir ([name]) 50 MG tablet Take 1 tablet (50 mg total) by mouth daily. 30 tablet 11   emtricitabine-tenofovir AF (DESCOVY) 200-25 MG tablet Take 1 tablet by mouth daily. 30 tablet 11   FLOVENT HFA 110 MCG/ACT inhaler TAKE 1 PUFF BY MOUTH TWICE A DAY (Patient taking differently: Inhale 1 puff into the lungs 2 (two) times daily as needed (wheezing/shortness of breath).) 36 Inhaler 1   fluticasone (FLONASE) 50 MCG/ACT nasal spray Place 2 sprays into both nostrils daily. 16 g 6   gabapentin (NEURONTIN) 300 MG capsule Take 300 mg by mouth daily.     glipiZIDE (GLUCOTROL) 10 MG tablet TAKE 1 TABLET BY MOUTH TWICE A DAY FOR DIABETES 180 tablet 0   glucose blood (ONETOUCH VERIO) test strip USE AS INSTRUCTED TO TEST BLOOD SUGAR up to 3 times daily. 300 each 3   hydrALAZINE (APRESOLINE) 50 MG tablet TAKE 1 TABLET (50 MG) BY MOUTH IN THE MORNING AND AT BEDTIME 60 tablet 3   Lancets (ONETOUCH ULTRASOFT) lancets Use as instructed to test blood sugar daily 100 each 5   levothyroxine (SYNTHROID) 175 MCG tablet TAKE 1 TABLET BY MOUTH EVERY DAY 30 tablet 7   minocycline (DYNACIN) 100 MG tablet Take 1 tablet (100 mg total) by mouth 2 (two) times daily. 60 tablet 11   omeprazole (PRILOSEC) 40 MG capsule Take 1 capsule (40 mg total) by mouth 2 (two) times daily before a meal. 180 capsule 0   rosuvastatin (CRESTOR) 10 MG tablet TAKE 1 TABLET BY MOUTH EVERY DAY FOR CHOLESTEROL 90 tablet 2   sertraline (ZOLOFT) 50 MG tablet TAKE 1 TABLET (50 MG TOTAL) BY MOUTH DAILY. FOR ANXIETY AND DEPRESSION. 90 tablet 3   tamoxifen (NOLVADEX) 20 MG tablet Take 1 tablet (20 mg total)  by mouth daily. 90 tablet 4   Current Facility-Administered Medications on File Prior to Visit  Medication Dose Route Frequency Provider Last Rate Last Admin   sodium chloride flush (NS) 0.9 % injection 10 mL  10 mL Intracatheter PRN Magrinat, Virgie Dad, MD   10 mL at 09/19/18 1551   Allergies  Allergen Reactions    Genvoya [Elviteg-Cobic-Emtricit-Tenofaf] Hives   Lisinopril Cough   Tape Rash    Rash  Okay with tegaderm and paper tape   Tegaderm Ag Mesh [Silver] Itching   Family History  Problem Relation Age of Onset   Hypertension Mother    Cancer Mother        laryngeal   Heart disease Mother        stent   Pancreatic cancer Father    Hypertension Father    Lung cancer Father 40       hx smoking   Hypertension Sister    Hypertension Sister    Hypertension Brother    Breast cancer Maternal Aunt 67   Breast cancer Maternal Aunt        dx 60+   Cancer Maternal Uncle        Lung CA   Breast cancer Paternal Aunt 70   Breast cancer Paternal Aunt        dx 48's   Breast cancer Paternal Aunt        dx 50's   Prostate cancer Paternal Uncle    Prostate cancer Paternal Uncle    Lung cancer Paternal Uncle    Breast cancer Cousin 26   Breast cancer Cousin        dx <50   Breast cancer Cousin        dx <50   Breast cancer Cousin        dx <50   Heart disease Other    Hypertension Other    Stroke Other        Grandparent   Kidney disease Other        Grandparent   Diabetes Other        FH of Diabetes   Colon cancer Neg Hx    Esophageal cancer Neg Hx    Rectal cancer Neg Hx    Stomach cancer Neg Hx    PE: LMP 03/31/2014 (LMP Unknown)  Wt Readings from Last 3 Encounters:  12/19/22 211 lb 8 oz (95.9 kg)  12/14/22 206 lb (93.4 kg)  09/15/22 214 lb 6 oz (97.2 kg)   Constitutional:  in NAD  The physical exam was not performed (virtual visit).  ASSESSMENT: 1. Thyroid cancer - see HPI  2. Postsurgical Hypothyroidism -Uncontrolled  3. DM2  PLAN:  1. Thyroid cancer - papillary -Patient with a interesting long history of metastatic papillary thyroid cancer, detected in cervical nodes, without thyroid primary tumor found.  There is no evidence of a thyroglossal duct cyst or ectopic thyroid tissue on imaging. - she initially had right lobectomy in 2009 which was negative for  thyroid cancer.  After having had a lymph node biopsy showing PTC metastasis in the left lateral neck, she had a completion thyroidectomy by Dr. Redmond Baseman in 2018.  Very interestingly, the final pathology showed no malignancy.  -She had RAI treatment and a posttreatment whole-body scan showed a focus of abnormal tracer in the right superior cervical neck, consistent with a lymph node metastasis.  We discussed that the RAI treatment may have ablated this focus.   -At last visit, we checked her  thyroglobulin and ATA antibodies.  Thyroglobulin continues to decrease, and ATA antibodies are undetectable.  Will recheck this at next visit. -Plan to get another whole-body scan now, 1 year after the previous - I will see her back in 6 months  2.  Patient with h/o total thyroidectomy for cancer, now with iatrogenic hypothyroidism, on levothyroxine therapy - latest thyroid labs reviewed with pt. >> normal: Lab Results  Component Value Date   TSH 2.61 08/23/2022  - she continues on LT4 175 mcg daily - pt feels good on this dose.  At last visit she was on double dose of levothyroxine by mistake and she was having anxiety and 17 pound weight loss.  We backed off the dose.  She has no complaints at today's visit other than related to her current URI. - we discussed about taking the thyroid hormone every day, with water, >30 minutes before breakfast, separated by >4 hours from acid reflux medications, calcium, iron, multivitamins. Pt. is taking it correctly now, after moving vitamins later in the day - will check thyroid tests when she can get to the clinic (I advised her to come approximately 1 week after she becomes asymptomatic): TSH and fT4 - If labs are abnormal, she will need to return for repeat TFTs in 1.5 months  Needs refills after the new labs.  Philemon Kingdom, MD PhD [facility] Endocrinology

## 2022-12-27 ENCOUNTER — Telehealth (INDEPENDENT_AMBULATORY_CARE_PROVIDER_SITE_OTHER): Payer: Commercial Managed Care - HMO | Admitting: Internal Medicine

## 2022-12-27 ENCOUNTER — Encounter: Payer: Self-pay | Admitting: Internal Medicine

## 2022-12-27 DIAGNOSIS — C73 Malignant neoplasm of thyroid gland: Secondary | ICD-10-CM | POA: Diagnosis not present

## 2022-12-27 DIAGNOSIS — E890 Postprocedural hypothyroidism: Secondary | ICD-10-CM

## 2022-12-27 NOTE — Patient Instructions (Addendum)
Please continue levothyroxine 175 mcg daily.  Take the thyroid hormone every day, with water, at least 30 minutes before breakfast, separated by at least 4 hours from: - acid reflux medications - calcium - iron - multivitamins  Please come back for labs 1 week after you have stopped having any symptoms.  Please come back for a visit in 6 months.

## 2022-12-29 ENCOUNTER — Other Ambulatory Visit (HOSPITAL_COMMUNITY): Payer: Self-pay

## 2023-01-04 ENCOUNTER — Encounter (HOSPITAL_BASED_OUTPATIENT_CLINIC_OR_DEPARTMENT_OTHER): Payer: Self-pay

## 2023-01-04 ENCOUNTER — Inpatient Hospital Stay (HOSPITAL_COMMUNITY): Admission: RE | Admit: 2023-01-04 | Payer: Commercial Managed Care - HMO | Source: Ambulatory Visit

## 2023-01-11 ENCOUNTER — Other Ambulatory Visit: Payer: Self-pay | Admitting: Internal Medicine

## 2023-01-11 ENCOUNTER — Other Ambulatory Visit: Payer: Self-pay | Admitting: Cardiovascular Disease

## 2023-01-11 ENCOUNTER — Other Ambulatory Visit: Payer: Self-pay | Admitting: Primary Care

## 2023-01-11 DIAGNOSIS — J069 Acute upper respiratory infection, unspecified: Secondary | ICD-10-CM

## 2023-01-11 DIAGNOSIS — B20 Human immunodeficiency virus [HIV] disease: Secondary | ICD-10-CM

## 2023-01-11 DIAGNOSIS — M1A09X0 Idiopathic chronic gout, multiple sites, without tophus (tophi): Secondary | ICD-10-CM

## 2023-01-11 NOTE — Telephone Encounter (Signed)
Please call pt to schedule overdue follow-up with Dr. Oval Linsey or APP for refills. Last OV with Dr. Oval Linsey in 2021. Had been following up with Erasmo Downer, [facility] but last ov with her 01/2022. Thank you!

## 2023-01-11 NOTE — Telephone Encounter (Signed)
This is a HTN clinic patient--I will forward to [place] for follow up

## 2023-01-11 NOTE — Telephone Encounter (Signed)
Patient is due for CPE/follow up in May. Can we schedule her at the end of a morning or afternoon session?

## 2023-01-11 NOTE — Telephone Encounter (Signed)
Called patient and scheduled for may.

## 2023-01-13 NOTE — Telephone Encounter (Signed)
LVM 3/27 at 4:34 pm and 3/29 at 10:37am, for patient to schedule [facility] Clinic f/u.

## 2023-01-19 NOTE — Telephone Encounter (Signed)
01/19/2023 9:58 am LVM for patient to schedule HTN Clinic f/u, patient is overdue and cannot get refills until an appt is scheduled. - sz

## 2023-01-28 ENCOUNTER — Encounter (HOSPITAL_BASED_OUTPATIENT_CLINIC_OR_DEPARTMENT_OTHER): Payer: Self-pay

## 2023-01-28 ENCOUNTER — Other Ambulatory Visit: Payer: Self-pay

## 2023-01-28 DIAGNOSIS — Z9221 Personal history of antineoplastic chemotherapy: Secondary | ICD-10-CM

## 2023-01-28 DIAGNOSIS — R339 Retention of urine, unspecified: Secondary | ICD-10-CM | POA: Diagnosis not present

## 2023-01-28 DIAGNOSIS — Z808 Family history of malignant neoplasm of other organs or systems: Secondary | ICD-10-CM

## 2023-01-28 DIAGNOSIS — E1151 Type 2 diabetes mellitus with diabetic peripheral angiopathy without gangrene: Secondary | ICD-10-CM | POA: Diagnosis present

## 2023-01-28 DIAGNOSIS — N179 Acute kidney failure, unspecified: Secondary | ICD-10-CM | POA: Diagnosis present

## 2023-01-28 DIAGNOSIS — Z8249 Family history of ischemic heart disease and other diseases of the circulatory system: Secondary | ICD-10-CM

## 2023-01-28 DIAGNOSIS — Z823 Family history of stroke: Secondary | ICD-10-CM

## 2023-01-28 DIAGNOSIS — F419 Anxiety disorder, unspecified: Secondary | ICD-10-CM | POA: Diagnosis present

## 2023-01-28 DIAGNOSIS — D509 Iron deficiency anemia, unspecified: Secondary | ICD-10-CM | POA: Diagnosis present

## 2023-01-28 DIAGNOSIS — E1169 Type 2 diabetes mellitus with other specified complication: Secondary | ICD-10-CM | POA: Diagnosis present

## 2023-01-28 DIAGNOSIS — E440 Moderate protein-calorie malnutrition: Secondary | ICD-10-CM | POA: Diagnosis present

## 2023-01-28 DIAGNOSIS — E871 Hypo-osmolality and hyponatremia: Secondary | ICD-10-CM | POA: Diagnosis not present

## 2023-01-28 DIAGNOSIS — N186 End stage renal disease: Secondary | ICD-10-CM | POA: Diagnosis present

## 2023-01-28 DIAGNOSIS — G9349 Other encephalopathy: Secondary | ICD-10-CM | POA: Diagnosis not present

## 2023-01-28 DIAGNOSIS — Z7951 Long term (current) use of inhaled steroids: Secondary | ICD-10-CM

## 2023-01-28 DIAGNOSIS — R1310 Dysphagia, unspecified: Secondary | ICD-10-CM | POA: Diagnosis present

## 2023-01-28 DIAGNOSIS — Z800 Family history of malignant neoplasm of digestive organs: Secondary | ICD-10-CM

## 2023-01-28 DIAGNOSIS — E872 Acidosis, unspecified: Secondary | ICD-10-CM | POA: Diagnosis present

## 2023-01-28 DIAGNOSIS — K561 Intussusception: Secondary | ICD-10-CM | POA: Diagnosis present

## 2023-01-28 DIAGNOSIS — J9811 Atelectasis: Secondary | ICD-10-CM | POA: Diagnosis not present

## 2023-01-28 DIAGNOSIS — E890 Postprocedural hypothyroidism: Secondary | ICD-10-CM | POA: Diagnosis present

## 2023-01-28 DIAGNOSIS — I959 Hypotension, unspecified: Secondary | ICD-10-CM | POA: Diagnosis not present

## 2023-01-28 DIAGNOSIS — M79604 Pain in right leg: Secondary | ICD-10-CM | POA: Diagnosis not present

## 2023-01-28 DIAGNOSIS — Z87442 Personal history of urinary calculi: Secondary | ICD-10-CM

## 2023-01-28 DIAGNOSIS — Z803 Family history of malignant neoplasm of breast: Secondary | ICD-10-CM

## 2023-01-28 DIAGNOSIS — E781 Pure hyperglyceridemia: Secondary | ICD-10-CM | POA: Diagnosis present

## 2023-01-28 DIAGNOSIS — M545 Low back pain, unspecified: Secondary | ICD-10-CM | POA: Diagnosis not present

## 2023-01-28 DIAGNOSIS — Z79890 Hormone replacement therapy (postmenopausal): Secondary | ICD-10-CM

## 2023-01-28 DIAGNOSIS — F32A Depression, unspecified: Secondary | ICD-10-CM | POA: Diagnosis present

## 2023-01-28 DIAGNOSIS — Z888 Allergy status to other drugs, medicaments and biological substances status: Secondary | ICD-10-CM

## 2023-01-28 DIAGNOSIS — Z833 Family history of diabetes mellitus: Secondary | ICD-10-CM

## 2023-01-28 DIAGNOSIS — E876 Hypokalemia: Secondary | ICD-10-CM | POA: Diagnosis present

## 2023-01-28 DIAGNOSIS — Z7984 Long term (current) use of oral hypoglycemic drugs: Secondary | ICD-10-CM

## 2023-01-28 DIAGNOSIS — I120 Hypertensive chronic kidney disease with stage 5 chronic kidney disease or end stage renal disease: Secondary | ICD-10-CM | POA: Diagnosis present

## 2023-01-28 DIAGNOSIS — Z8616 Personal history of COVID-19: Secondary | ICD-10-CM

## 2023-01-28 DIAGNOSIS — K219 Gastro-esophageal reflux disease without esophagitis: Secondary | ICD-10-CM | POA: Diagnosis present

## 2023-01-28 DIAGNOSIS — R200 Anesthesia of skin: Secondary | ICD-10-CM | POA: Diagnosis present

## 2023-01-28 DIAGNOSIS — Z87891 Personal history of nicotine dependence: Secondary | ICD-10-CM

## 2023-01-28 DIAGNOSIS — I998 Other disorder of circulatory system: Secondary | ICD-10-CM | POA: Diagnosis present

## 2023-01-28 DIAGNOSIS — Z923 Personal history of irradiation: Secondary | ICD-10-CM

## 2023-01-28 DIAGNOSIS — I71012 Dissection of descending thoracic aorta: Secondary | ICD-10-CM | POA: Diagnosis not present

## 2023-01-28 DIAGNOSIS — D869 Sarcoidosis, unspecified: Secondary | ICD-10-CM | POA: Diagnosis present

## 2023-01-28 DIAGNOSIS — E1122 Type 2 diabetes mellitus with diabetic chronic kidney disease: Secondary | ICD-10-CM | POA: Diagnosis present

## 2023-01-28 DIAGNOSIS — Z801 Family history of malignant neoplasm of trachea, bronchus and lung: Secondary | ICD-10-CM

## 2023-01-28 DIAGNOSIS — B379 Candidiasis, unspecified: Secondary | ICD-10-CM | POA: Diagnosis not present

## 2023-01-28 DIAGNOSIS — D631 Anemia in chronic kidney disease: Secondary | ICD-10-CM | POA: Diagnosis present

## 2023-01-28 DIAGNOSIS — Z85850 Personal history of malignant neoplasm of thyroid: Secondary | ICD-10-CM

## 2023-01-28 DIAGNOSIS — J45909 Unspecified asthma, uncomplicated: Secondary | ICD-10-CM | POA: Diagnosis present

## 2023-01-28 DIAGNOSIS — I7100 Dissection of unspecified site of aorta: Secondary | ICD-10-CM | POA: Diagnosis not present

## 2023-01-28 DIAGNOSIS — I71011 Dissection of aortic arch: Secondary | ICD-10-CM | POA: Diagnosis present

## 2023-01-28 DIAGNOSIS — E8770 Fluid overload, unspecified: Secondary | ICD-10-CM | POA: Diagnosis not present

## 2023-01-28 DIAGNOSIS — E1165 Type 2 diabetes mellitus with hyperglycemia: Secondary | ICD-10-CM | POA: Diagnosis present

## 2023-01-28 DIAGNOSIS — K565 Intestinal adhesions [bands], unspecified as to partial versus complete obstruction: Secondary | ICD-10-CM | POA: Diagnosis not present

## 2023-01-28 DIAGNOSIS — Z6838 Body mass index (BMI) 38.0-38.9, adult: Secondary | ICD-10-CM

## 2023-01-28 DIAGNOSIS — Z8042 Family history of malignant neoplasm of prostate: Secondary | ICD-10-CM

## 2023-01-28 DIAGNOSIS — Z79899 Other long term (current) drug therapy: Secondary | ICD-10-CM

## 2023-01-28 DIAGNOSIS — K567 Ileus, unspecified: Secondary | ICD-10-CM | POA: Diagnosis not present

## 2023-01-28 DIAGNOSIS — I161 Hypertensive emergency: Secondary | ICD-10-CM | POA: Diagnosis present

## 2023-01-28 MED ORDER — ONDANSETRON HCL 4 MG/2ML IJ SOLN
4.0000 mg | Freq: Once | INTRAMUSCULAR | Status: AC | PRN
  Administered 2023-01-29: 4 mg via INTRAVENOUS
  Filled 2023-01-28: qty 2

## 2023-01-28 NOTE — ED Triage Notes (Signed)
Pt POV from home reporting lower back pain that started yesterday and multiple episodes of emesis today. Pt also endorses increases SOB. HX of similar pain back in September, unknown etiology. Hx kidney stones

## 2023-01-29 ENCOUNTER — Emergency Department (HOSPITAL_BASED_OUTPATIENT_CLINIC_OR_DEPARTMENT_OTHER): Payer: Medicare Other

## 2023-01-29 ENCOUNTER — Encounter (HOSPITAL_COMMUNITY): Payer: Commercial Managed Care - HMO

## 2023-01-29 ENCOUNTER — Other Ambulatory Visit (HOSPITAL_COMMUNITY): Payer: Commercial Managed Care - HMO

## 2023-01-29 ENCOUNTER — Inpatient Hospital Stay (HOSPITAL_BASED_OUTPATIENT_CLINIC_OR_DEPARTMENT_OTHER)
Admission: EM | Admit: 2023-01-29 | Discharge: 2023-02-21 | DRG: 219 | Disposition: A | Discharge status: Home Health | Payer: Medicare Other | Attending: Internal Medicine | Admitting: Internal Medicine

## 2023-01-29 DIAGNOSIS — G9340 Encephalopathy, unspecified: Secondary | ICD-10-CM

## 2023-01-29 DIAGNOSIS — I71011 Dissection of aortic arch: Secondary | ICD-10-CM | POA: Diagnosis present

## 2023-01-29 DIAGNOSIS — K561 Intussusception: Secondary | ICD-10-CM | POA: Diagnosis not present

## 2023-01-29 DIAGNOSIS — N184 Chronic kidney disease, stage 4 (severe): Secondary | ICD-10-CM | POA: Diagnosis not present

## 2023-01-29 DIAGNOSIS — I7103 Dissection of thoracoabdominal aorta: Secondary | ICD-10-CM

## 2023-01-29 DIAGNOSIS — Z87891 Personal history of nicotine dependence: Secondary | ICD-10-CM | POA: Diagnosis not present

## 2023-01-29 DIAGNOSIS — R739 Hyperglycemia, unspecified: Secondary | ICD-10-CM | POA: Diagnosis not present

## 2023-01-29 DIAGNOSIS — K567 Ileus, unspecified: Secondary | ICD-10-CM | POA: Diagnosis not present

## 2023-01-29 DIAGNOSIS — N186 End stage renal disease: Secondary | ICD-10-CM | POA: Diagnosis not present

## 2023-01-29 DIAGNOSIS — Z992 Dependence on renal dialysis: Secondary | ICD-10-CM | POA: Diagnosis not present

## 2023-01-29 DIAGNOSIS — I7100 Dissection of unspecified site of aorta: Secondary | ICD-10-CM | POA: Diagnosis present

## 2023-01-29 DIAGNOSIS — B20 Human immunodeficiency virus [HIV] disease: Secondary | ICD-10-CM | POA: Diagnosis present

## 2023-01-29 DIAGNOSIS — G9341 Metabolic encephalopathy: Secondary | ICD-10-CM | POA: Diagnosis not present

## 2023-01-29 DIAGNOSIS — I161 Hypertensive emergency: Secondary | ICD-10-CM | POA: Diagnosis not present

## 2023-01-29 DIAGNOSIS — I10 Essential (primary) hypertension: Secondary | ICD-10-CM | POA: Diagnosis present

## 2023-01-29 DIAGNOSIS — J9811 Atelectasis: Secondary | ICD-10-CM | POA: Diagnosis not present

## 2023-01-29 DIAGNOSIS — E039 Hypothyroidism, unspecified: Secondary | ICD-10-CM

## 2023-01-29 DIAGNOSIS — E872 Acidosis, unspecified: Secondary | ICD-10-CM | POA: Diagnosis not present

## 2023-01-29 DIAGNOSIS — I7102 Dissection of abdominal aorta: Secondary | ICD-10-CM | POA: Diagnosis not present

## 2023-01-29 DIAGNOSIS — N179 Acute kidney failure, unspecified: Secondary | ICD-10-CM | POA: Diagnosis present

## 2023-01-29 DIAGNOSIS — E871 Hypo-osmolality and hyponatremia: Secondary | ICD-10-CM | POA: Diagnosis not present

## 2023-01-29 DIAGNOSIS — I712 Thoracic aortic aneurysm, without rupture, unspecified: Secondary | ICD-10-CM | POA: Diagnosis not present

## 2023-01-29 DIAGNOSIS — Z21 Asymptomatic human immunodeficiency virus [HIV] infection status: Secondary | ICD-10-CM | POA: Diagnosis present

## 2023-01-29 DIAGNOSIS — E785 Hyperlipidemia, unspecified: Secondary | ICD-10-CM | POA: Diagnosis not present

## 2023-01-29 DIAGNOSIS — E1122 Type 2 diabetes mellitus with diabetic chronic kidney disease: Secondary | ICD-10-CM | POA: Diagnosis present

## 2023-01-29 DIAGNOSIS — E1169 Type 2 diabetes mellitus with other specified complication: Secondary | ICD-10-CM | POA: Diagnosis not present

## 2023-01-29 DIAGNOSIS — I959 Hypotension, unspecified: Secondary | ICD-10-CM | POA: Diagnosis not present

## 2023-01-29 DIAGNOSIS — E440 Moderate protein-calorie malnutrition: Secondary | ICD-10-CM

## 2023-01-29 DIAGNOSIS — I120 Hypertensive chronic kidney disease with stage 5 chronic kidney disease or end stage renal disease: Secondary | ICD-10-CM | POA: Diagnosis not present

## 2023-01-29 DIAGNOSIS — G9349 Other encephalopathy: Secondary | ICD-10-CM | POA: Diagnosis not present

## 2023-01-29 DIAGNOSIS — E1165 Type 2 diabetes mellitus with hyperglycemia: Secondary | ICD-10-CM | POA: Diagnosis present

## 2023-01-29 DIAGNOSIS — Z8616 Personal history of COVID-19: Secondary | ICD-10-CM | POA: Diagnosis not present

## 2023-01-29 DIAGNOSIS — I71012 Dissection of descending thoracic aorta: Secondary | ICD-10-CM | POA: Diagnosis not present

## 2023-01-29 DIAGNOSIS — K565 Intestinal adhesions [bands], unspecified as to partial versus complete obstruction: Secondary | ICD-10-CM | POA: Diagnosis not present

## 2023-01-29 DIAGNOSIS — D631 Anemia in chronic kidney disease: Secondary | ICD-10-CM | POA: Diagnosis not present

## 2023-01-29 DIAGNOSIS — E890 Postprocedural hypothyroidism: Secondary | ICD-10-CM | POA: Diagnosis present

## 2023-01-29 DIAGNOSIS — Z8679 Personal history of other diseases of the circulatory system: Secondary | ICD-10-CM | POA: Diagnosis present

## 2023-01-29 DIAGNOSIS — M314 Aortic arch syndrome [Takayasu]: Secondary | ICD-10-CM | POA: Diagnosis not present

## 2023-01-29 DIAGNOSIS — Z794 Long term (current) use of insulin: Secondary | ICD-10-CM | POA: Diagnosis not present

## 2023-01-29 DIAGNOSIS — F32A Depression, unspecified: Secondary | ICD-10-CM | POA: Diagnosis present

## 2023-01-29 DIAGNOSIS — J45909 Unspecified asthma, uncomplicated: Secondary | ICD-10-CM | POA: Diagnosis present

## 2023-01-29 LAB — CBC
HCT: 25.7 % — ABNORMAL LOW (ref 36.0–46.0)
Hemoglobin: 8.7 g/dL — ABNORMAL LOW (ref 12.0–15.0)
MCH: 32.8 pg (ref 26.0–34.0)
MCHC: 33.9 g/dL (ref 30.0–36.0)
MCV: 97 fL (ref 80.0–100.0)
Platelets: 180 10*3/uL (ref 150–400)
RBC: 2.65 MIL/uL — ABNORMAL LOW (ref 3.87–5.11)
RDW: 16.4 % — ABNORMAL HIGH (ref 11.5–15.5)
WBC: 14.2 10*3/uL — ABNORMAL HIGH (ref 4.0–10.5)
nRBC: 0 % (ref 0.0–0.2)

## 2023-01-29 LAB — GLUCOSE, CAPILLARY
Glucose-Capillary: 173 mg/dL — ABNORMAL HIGH (ref 70–99)
Glucose-Capillary: 129 mg/dL — ABNORMAL HIGH (ref 70–99)
Glucose-Capillary: 124 mg/dL — ABNORMAL HIGH (ref 70–99)
Glucose-Capillary: 158 mg/dL — ABNORMAL HIGH (ref 70–99)
Glucose-Capillary: 182 mg/dL — ABNORMAL HIGH (ref 70–99)

## 2023-01-29 LAB — COMPREHENSIVE METABOLIC PANEL
ALT: 12 U/L (ref 0–44)
AST: 17 U/L (ref 15–41)
Albumin: 3.7 g/dL (ref 3.5–5.0)
Alkaline Phosphatase: 97 U/L (ref 38–126)
Anion gap: 13 (ref 5–15)
BUN: 54 mg/dL — ABNORMAL HIGH (ref 6–20)
CO2: 24 mmol/L (ref 22–32)
Calcium: 9.3 mg/dL (ref 8.9–10.3)
Chloride: 102 mmol/L (ref 98–111)
Creatinine, Ser: 5.99 mg/dL — ABNORMAL HIGH (ref 0.44–1.00)
GFR, Estimated: 8 mL/min — ABNORMAL LOW (ref >60)
Glucose, Bld: 156 mg/dL — ABNORMAL HIGH (ref 70–99)
Potassium: 3.7 mmol/L (ref 3.5–5.1)
Sodium: 139 mmol/L (ref 135–145)
Total Bilirubin: 0.3 mg/dL (ref 0.3–1.2)
Total Protein: 8.1 g/dL (ref 6.5–8.1)

## 2023-01-29 LAB — HEMOGLOBIN A1C
Hgb A1c MFr Bld: 6 % — ABNORMAL HIGH (ref 4.8–5.6)
Mean Plasma Glucose: 125.5 mg/dL

## 2023-01-29 LAB — TYPE AND SCREEN
ABO/RH(D): O POS
Antibody Screen: NEGATIVE

## 2023-01-29 LAB — LIPASE, BLOOD: Lipase: 51 U/L (ref 11–51)

## 2023-01-29 LAB — MRSA NEXT GEN BY PCR, NASAL: MRSA by PCR Next Gen: NOT DETECTED

## 2023-01-29 MED ORDER — PANTOPRAZOLE SODIUM 40 MG PO TBEC
40.0000 mg | DELAYED_RELEASE_TABLET | Freq: Every day | ORAL | Status: DC
  Administered 2023-01-29 – 2023-02-04 (×7): 40 mg via ORAL
  Filled 2023-01-29 (×7): qty 1

## 2023-01-29 MED ORDER — CARVEDILOL 25 MG PO TABS
25.0000 mg | ORAL_TABLET | Freq: Two times a day (BID) | ORAL | Status: DC
  Administered 2023-01-29 – 2023-01-31 (×6): 25 mg via ORAL
  Filled 2023-01-29 (×6): qty 1

## 2023-01-29 MED ORDER — ONDANSETRON HCL 4 MG/2ML IJ SOLN
4.0000 mg | Freq: Once | INTRAMUSCULAR | Status: AC
  Administered 2023-01-29: 4 mg via INTRAVENOUS
  Filled 2023-01-29: qty 2

## 2023-01-29 MED ORDER — INSULIN ASPART 100 UNIT/ML IJ SOLN
0.0000 [IU] | INTRAMUSCULAR | Status: DC
  Administered 2023-01-29 (×3): 3 [IU] via SUBCUTANEOUS
  Administered 2023-01-29 – 2023-02-01 (×11): 2 [IU] via SUBCUTANEOUS
  Administered 2023-02-01 (×2): 3 [IU] via SUBCUTANEOUS
  Administered 2023-02-01: 2 [IU] via SUBCUTANEOUS
  Administered 2023-02-01: 3 [IU] via SUBCUTANEOUS
  Administered 2023-02-02 (×2): 2 [IU] via SUBCUTANEOUS
  Administered 2023-02-02 – 2023-02-03 (×3): 3 [IU] via SUBCUTANEOUS
  Administered 2023-02-03 (×2): 2 [IU] via SUBCUTANEOUS
  Administered 2023-02-03 – 2023-02-04 (×3): 3 [IU] via SUBCUTANEOUS
  Administered 2023-02-04 (×2): 2 [IU] via SUBCUTANEOUS
  Administered 2023-02-04 (×2): 3 [IU] via SUBCUTANEOUS
  Administered 2023-02-05: 2 [IU] via SUBCUTANEOUS
  Administered 2023-02-05: 3 [IU] via SUBCUTANEOUS
  Administered 2023-02-05: 2 [IU] via SUBCUTANEOUS
  Administered 2023-02-05: 1 [IU] via SUBCUTANEOUS
  Administered 2023-02-05: 2 [IU] via SUBCUTANEOUS
  Administered 2023-02-06 (×3): 3 [IU] via SUBCUTANEOUS
  Administered 2023-02-06: 2 [IU] via SUBCUTANEOUS
  Administered 2023-02-07: 3 [IU] via SUBCUTANEOUS
  Administered 2023-02-07 (×2): 2 [IU] via SUBCUTANEOUS
  Administered 2023-02-07: 3 [IU] via SUBCUTANEOUS
  Administered 2023-02-08 – 2023-02-09 (×2): 2 [IU] via SUBCUTANEOUS
  Administered 2023-02-09 (×2): 3 [IU] via SUBCUTANEOUS
  Administered 2023-02-09 – 2023-02-10 (×4): 2 [IU] via SUBCUTANEOUS
  Administered 2023-02-10: 3 [IU] via SUBCUTANEOUS
  Administered 2023-02-10: 5 [IU] via SUBCUTANEOUS
  Administered 2023-02-10: 3 [IU] via SUBCUTANEOUS
  Administered 2023-02-10 (×2): 2 [IU] via SUBCUTANEOUS
  Administered 2023-02-11 (×3): 3 [IU] via SUBCUTANEOUS
  Administered 2023-02-11: 5 [IU] via SUBCUTANEOUS
  Administered 2023-02-11 (×2): 3 [IU] via SUBCUTANEOUS
  Administered 2023-02-12: 8 [IU] via SUBCUTANEOUS
  Administered 2023-02-12: 5 [IU] via SUBCUTANEOUS
  Administered 2023-02-12: 3 [IU] via SUBCUTANEOUS
  Administered 2023-02-12: 5 [IU] via SUBCUTANEOUS
  Administered 2023-02-12 (×2): 3 [IU] via SUBCUTANEOUS
  Administered 2023-02-12 – 2023-02-13 (×4): 5 [IU] via SUBCUTANEOUS
  Administered 2023-02-14 (×2): 3 [IU] via SUBCUTANEOUS
  Administered 2023-02-14: 5 [IU] via SUBCUTANEOUS
  Administered 2023-02-14 (×2): 3 [IU] via SUBCUTANEOUS
  Administered 2023-02-15: 2 [IU] via SUBCUTANEOUS
  Administered 2023-02-15: 3 [IU] via SUBCUTANEOUS
  Administered 2023-02-15: 8 [IU] via SUBCUTANEOUS
  Administered 2023-02-15 – 2023-02-16 (×3): 3 [IU] via SUBCUTANEOUS
  Administered 2023-02-16: 2 [IU] via SUBCUTANEOUS
  Administered 2023-02-16: 5 [IU] via SUBCUTANEOUS
  Administered 2023-02-16: 2 [IU] via SUBCUTANEOUS
  Administered 2023-02-16: 3 [IU] via SUBCUTANEOUS

## 2023-01-29 MED ORDER — DOCUSATE SODIUM 100 MG PO CAPS: 100.0000 mg | ORAL_CAPSULE | Freq: Two times a day (BID) | ORAL | Status: DC | PRN

## 2023-01-29 MED ORDER — HYDROMORPHONE HCL 1 MG/ML IJ SOLN
1.0000 mg | Freq: Once | INTRAMUSCULAR | Status: AC
  Administered 2023-01-29: 1 mg via INTRAVENOUS
  Filled 2023-01-29: qty 1

## 2023-01-29 MED ORDER — MORPHINE SULFATE (PF) 4 MG/ML IV SOLN: 4.0000 mg | Freq: Once | INTRAVENOUS | Status: DC

## 2023-01-29 MED ORDER — CHLORHEXIDINE GLUCONATE CLOTH 2 % EX PADS
6.0000 | MEDICATED_PAD | Freq: Every day | CUTANEOUS | Status: DC
  Administered 2023-01-29 – 2023-02-21 (×24): 6 via TOPICAL

## 2023-01-29 MED ORDER — DOLUTEGRAVIR SODIUM 50 MG PO TABS
50.0000 mg | ORAL_TABLET | Freq: Every day | ORAL | Status: DC
  Administered 2023-01-29 – 2023-02-01 (×4): 50 mg via ORAL
  Filled 2023-01-29 (×4): qty 1

## 2023-01-29 MED ORDER — NITROGLYCERIN IN D5W 200-5 MCG/ML-% IV SOLN
0.0000 ug/min | INTRAVENOUS | Status: DC
  Administered 2023-01-29: 20 ug/min via INTRAVENOUS
  Filled 2023-01-29: qty 250

## 2023-01-29 MED ORDER — ONDANSETRON HCL 4 MG/2ML IJ SOLN
4.0000 mg | Freq: Four times a day (QID) | INTRAMUSCULAR | Status: DC | PRN
  Administered 2023-01-29 – 2023-02-19 (×18): 4 mg via INTRAVENOUS
  Filled 2023-01-29 (×19): qty 2

## 2023-01-29 MED ORDER — SERTRALINE HCL 50 MG PO TABS
50.0000 mg | ORAL_TABLET | Freq: Every day | ORAL | Status: DC
  Administered 2023-01-30 – 2023-02-01 (×2): 50 mg via ORAL
  Filled 2023-01-29 (×4): qty 1

## 2023-01-29 MED ORDER — NICARDIPINE HCL IN NACL 20-0.86 MG/200ML-% IV SOLN
3.0000 mg/h | INTRAVENOUS | Status: DC
  Filled 2023-01-29: qty 200

## 2023-01-29 MED ORDER — AMLODIPINE BESYLATE 10 MG PO TABS
10.0000 mg | ORAL_TABLET | Freq: Every day | ORAL | Status: DC
  Administered 2023-01-29 – 2023-02-01 (×4): 10 mg via ORAL
  Filled 2023-01-29 (×4): qty 1

## 2023-01-29 MED ORDER — LEVOTHYROXINE SODIUM 75 MCG PO TABS
175.0000 ug | ORAL_TABLET | Freq: Every day | ORAL | Status: DC
  Administered 2023-01-31 – 2023-02-01 (×2): 175 ug via ORAL
  Filled 2023-01-29 (×2): qty 1

## 2023-01-29 MED ORDER — POLYETHYLENE GLYCOL 3350 17 G PO PACK: 17.0000 g | PACK | Freq: Every day | ORAL | Status: DC | PRN

## 2023-01-29 MED ORDER — EMTRICITABINE-TENOFOVIR AF 200-25 MG PO TABS
1.0000 | ORAL_TABLET | Freq: Every day | ORAL | Status: DC
  Administered 2023-01-29 – 2023-02-01 (×4): 1 via ORAL
  Filled 2023-01-29 (×4): qty 1

## 2023-01-29 MED ORDER — SODIUM CHLORIDE 0.9 % IV BOLUS
1000.0000 mL | Freq: Once | INTRAVENOUS | Status: AC
  Administered 2023-01-29: 1000 mL via INTRAVENOUS

## 2023-01-29 MED ORDER — IOHEXOL 350 MG/ML SOLN
100.0000 mL | Freq: Once | INTRAVENOUS | Status: AC | PRN
  Administered 2023-01-29: 100 mL via INTRAVENOUS

## 2023-01-29 MED ORDER — CLEVIDIPINE BUTYRATE 0.5 MG/ML IV EMUL
0.0000 mg/h | INTRAVENOUS | Status: DC
  Administered 2023-01-29: 20 mg/h via INTRAVENOUS
  Administered 2023-01-29: 2 mg/h via INTRAVENOUS
  Administered 2023-01-29: 21 mg/h via INTRAVENOUS
  Administered 2023-01-29: 15 mg/h via INTRAVENOUS
  Administered 2023-01-29 – 2023-01-30 (×4): 21 mg/h via INTRAVENOUS
  Administered 2023-01-30: 15 mg/h via INTRAVENOUS
  Administered 2023-01-30 (×3): 21 mg/h via INTRAVENOUS
  Administered 2023-01-31: 20.5 mg/h via INTRAVENOUS
  Administered 2023-01-31: 21 mg/h via INTRAVENOUS
  Administered 2023-01-31: 19 mg/h via INTRAVENOUS
  Administered 2023-01-31: 14 mg/h via INTRAVENOUS
  Administered 2023-01-31 (×3): 20 mg/h via INTRAVENOUS
  Administered 2023-02-01 – 2023-02-02 (×13): 21 mg/h via INTRAVENOUS
  Filled 2023-01-29 (×8): qty 100
  Filled 2023-01-29: qty 200
  Filled 2023-01-29 (×14): qty 100
  Filled 2023-01-29: qty 200
  Filled 2023-01-29 (×12): qty 100

## 2023-01-29 MED ORDER — MORPHINE SULFATE (PF) 4 MG/ML IV SOLN
4.0000 mg | Freq: Once | INTRAVENOUS | Status: AC
  Administered 2023-01-29: 4 mg via INTRAVENOUS
  Filled 2023-01-29: qty 1

## 2023-01-29 MED ORDER — ESMOLOL HCL-SODIUM CHLORIDE 2000 MG/100ML IV SOLN
25.0000 ug/kg/min | INTRAVENOUS | Status: DC
  Administered 2023-01-29: 100 ug/kg/min via INTRAVENOUS
  Administered 2023-01-29 (×2): 150 ug/kg/min via INTRAVENOUS
  Administered 2023-01-29: 300 ug/kg/min via INTRAVENOUS
  Administered 2023-01-29 (×2): 200 ug/kg/min via INTRAVENOUS
  Administered 2023-01-29: 175 ug/kg/min via INTRAVENOUS
  Administered 2023-01-29: 165 ug/kg/min via INTRAVENOUS
  Administered 2023-01-29: 300 ug/kg/min via INTRAVENOUS
  Administered 2023-01-29: 25 ug/kg/min via INTRAVENOUS
  Administered 2023-01-29: 125 ug/kg/min via INTRAVENOUS
  Administered 2023-01-29: 175 ug/kg/min via INTRAVENOUS
  Administered 2023-01-30: 150 ug/kg/min via INTRAVENOUS
  Administered 2023-01-30: 125 ug/kg/min via INTRAVENOUS
  Filled 2023-01-29 (×22): qty 100

## 2023-01-29 MED ORDER — ROSUVASTATIN CALCIUM 5 MG PO TABS
10.0000 mg | ORAL_TABLET | Freq: Every day | ORAL | Status: DC
  Administered 2023-01-29 – 2023-02-01 (×4): 10 mg via ORAL
  Filled 2023-01-29 (×4): qty 2

## 2023-01-29 NOTE — ED Notes (Signed)
Pt in CT.

## 2023-01-29 NOTE — Procedures (Signed)
Arterial Catheter Insertion Procedure Note  Arisbet Deterding  [id number]  [birth year]  Date:01/29/23  Time:12:11 PM    Provider Performing: Cheri Fowler    Procedure: Insertion of Arterial Line (63875) with US guidance ([id number])   Indication(s) Blood pressure monitoring and/or need for frequent ABGs  Consent Risks of the procedure as well as the alternatives and risks of each were explained to the patient and/or caregiver.  Consent for the procedure was obtained and is signed in the bedside chart  Anesthesia None   Time Out Verified patient identification, verified procedure, site/side was marked, verified correct patient position, special equipment/implants available, medications/allergies/relevant history reviewed, required imaging and test results available.   Sterile Technique Maximal sterile technique including full sterile barrier drape, hand hygiene, sterile gown, sterile gloves, mask, hair covering, sterile ultrasound probe cover (if used).   Procedure Description Area of catheter insertion was cleaned with chlorhexidine and draped in sterile fashion. With real-time ultrasound guidance an arterial catheter was placed into the right  Axilalry  artery.  Appropriate arterial tracings confirmed on monitor.     Complications/Tolerance None; patient tolerated the procedure well.   EBL Minimal   Specimen(s) None

## 2023-01-29 NOTE — ED Notes (Signed)
Pt care taken, pt has esmolol running at 100 mcg/kg/hr in the left ac, nitro going at 63mcg/min in the rt ac

## 2023-01-29 NOTE — ED Notes (Addendum)
-  Called [facility] for emergent transportation to [facility]. Dr. Jacqulyn Bath accepting

## 2023-01-29 NOTE — ED Notes (Signed)
[facility] is here to transport pt to [facility]

## 2023-01-29 NOTE — Progress Notes (Signed)
57 year old female with diabetes type 2, CKD stage IV, HIV and sarcoidosis who was admitted with type B aortic dissection and AKI on CKD stage IV  Currently denies chest pain or abdominal pain  She is currently esmolol and clevidipine infusion, titrate with SBP goal <120 and heart rate goal <80  Started on oral antihypertensive meds including amlodipine and Coreg   Case discussed with vascular surgery and nephrology     Cheri Fowler, MD [facility] See Amion for pager If no response to pager, please call [phone] until 7pm After 7pm, Please call E-link [phone]

## 2023-01-29 NOTE — ED Provider Notes (Signed)
2:56 AM I assumed care of this patient.  Please see previous provider's note for further details of history, exam, and MDM.   Briefly patient is a 57 y.o. female who presented as a transfer [facility] where she presented for abdominal pain and back pain and found to have type B aortic dissection.  Patient was significantly hypertensive and started on esmolol and nitroglycerin drips.  Dr. Karin Lieu from vascular surgery is already aware the patient.  Will reconsult now that patient has arrived.  She is currently still hypertensive with systolics in the 200s. Reports that pain is improved after pain medicine.  4:05 AM BP improving with titration of NTG. Spoke with [facility] and they will see in the ER. Requested ICU admission.   .Critical Care  Performed by: Nira Conn, MD Authorized by: Nira Conn, MD   Critical care provider statement:    Critical care time (minutes):  30   Critical care was necessary to treat or prevent imminent or life-threatening deterioration of the following conditions:  Circulatory failure   Critical care was time spent personally by me on the following activities:  Development of treatment plan with patient or surrogate, discussions with consultants, evaluation of patient's response to treatment, examination of patient, ordering and review of laboratory studies, ordering and review of radiographic studies, ordering and performing treatments and interventions, pulse oximetry, re-evaluation of patient's condition and review of old charts   Care discussed with: admitting provider          Nira Conn, MD 01/29/23 [phone]

## 2023-01-29 NOTE — H&P (Signed)
NAME:  [name], MRN:  [id number], DOB:  [birth year], LOS: 0 ADMISSION DATE:  01/29/2023, CONSULTATION DATE:  01/29/23 REFERRING MD:  EDP, CHIEF COMPLAINT:  aortic dissection   History of Present Illness:  57 yo female with pmh t2dm, ckd4, hiv, sarcoidosis who prsented to [facility] with complaints of back and abdominal pain with nausea and non bloody vomiting. Her family at bedside endorse this has been ongoing for 2 days not. Family provides most history as pt is quite drowsy after pain medication. The state that pt had no other associated symptoms. She had been trying to treat herself for consitpation thinking that was the cause of her pain. No fevers/ chills. No diarrhea. No recent change in medications.   She presented with elevated BP. Systolics >200. She was started on esmolol and nitro gtts and vascular is coming to eval.   Pertinent  Medical History  T2dm Htn Hyperlipidemia Hiv Hypothyroidism ckd4  Significant Hospital Events: Including procedures, antibiotic start and stop dates in addition to other pertinent events   Admitted to hospital 4/14  Interim History / Subjective:    Objective   Blood pressure (!) 169/90, pulse 63, temperature 98 F (36.7 C), temperature source Oral, resp. rate 13, height  (1.651 m), weight 96.2 kg, last menstrual period 03/31/2014, SpO2 100 %.    FiO2 (%):  [2 %] 2 %  No intake or output data in the 24 hours ending 01/29/23 0440 Filed Weights   01/28/23 2347  Weight: 96.2 kg    Examination: General: nad, reclining comfortably in bed.  HENT: ncat, eomi, perrla, mm dry but pink Lungs: ctab Cardiovascular: rrr Abdomen: obese, nt,nd bs+ Extremities: no c/c +edema Neuro: drowsy but arousable GU: deferred  Resolved Hospital Problem list     Assessment & Plan:  Type B dissection:  Hypertensive emergency -nitro and esmolol gtt  -appreciate vascular input -impulse control -pain control  Aki on ckd4:  -monitor  uop -follow indices  T2dm:  -ssi -q4 checks  N/v:  -prn zofran  Hiv:  -cont home meds  Hypothyroid:  -cont levothyoxine   Best Practice (right click and "Reselect all SmartList Selections" daily)   Diet/type: NPO DVT prophylaxis: SCD GI prophylaxis: N/A Lines: N/A Foley:  N/A Code Status:  full code Last date of multidisciplinary goals of care discussion [full code 01/29/23]  Labs   CBC: Recent Labs  Lab 01/28/23 2358  WBC 14.2*  HGB 8.7*  HCT 25.7*  MCV 97.0  PLT 180    Basic Metabolic Panel: Recent Labs  Lab 01/28/23 2358  NA 139  K 3.7  CL 102  CO2 24  GLUCOSE 156*  BUN 54*  CREATININE 5.99*  CALCIUM 9.3   GFR: Estimated Creatinine Clearance: 12.3 mL/min (A) (by C-G formula based on SCr of 5.99 mg/dL (H)). Recent Labs  Lab 01/28/23 2358  WBC 14.2*    Liver Function Tests: Recent Labs  Lab 01/28/23 2358  AST 17  ALT 12  ALKPHOS 97  BILITOT 0.3  PROT 8.1  ALBUMIN 3.7   Recent Labs  Lab 01/28/23 2358  LIPASE 51   No results for input(s): "AMMONIA" in the last 168 hours.  ABG    Component Value Date/Time   PHART 7.398 03/12/2020 1215   PCO2ART 44.2 03/12/2020 1215   PO2ART 82 (L) 03/12/2020 1215   HCO3 29.4 (H) 03/12/2020 1219   TCO2 31 03/12/2020 1219   O2SAT 65.0 03/12/2020 1219     Coagulation Profile: No results  for input(s): "INR", "PROTIME" in the last 168 hours.  Cardiac Enzymes: No results for input(s): "CKTOTAL", "CKMB", "CKMBINDEX", "TROPONINI" in the last 168 hours.  HbA1C: Hemoglobin A1C  Date/Time Value Ref Range Status  07/29/2022 12:02 PM 6.7 (A) 4.0 - 5.6 % Final  04/28/2022 11:56 AM 8.0 (A) 4.0 - 5.6 % Final   Hgb A1c MFr Bld  Date/Time Value Ref Range Status  10/27/2021 11:45 AM 8.0 (H) 4.6 - 6.5 % Final    Comment:    Glycemic Control Guidelines for People with Diabetes:Non Diabetic:  <6%Goal of Therapy: <7%Additional Action Suggested:  >8%   08/24/2018 10:09 AM 6.2 4.6 - 6.5 % Final     Comment:    Glycemic Control Guidelines for People with Diabetes:Non Diabetic:  <6%Goal of Therapy: <7%Additional Action Suggested:  >8%     CBG: No results for input(s): "GLUCAP" in the last 168 hours.  Review of Systems:   Drowsy, back and stomach pain x 2 days. Improved since presentation. Nausea and vomiting yesterday evening. No other associated symptoms reported.   Past Medical History:  She,  has a past medical history of Acute pancreatitis (10/23/2021), Acute renal failure superimposed on stage 4 chronic kidney disease (10/24/2021), Allergy, Anemia, Antibiotic-induced yeast infection (09/28/2020), Anxiety, Asthma, Blood dyscrasia, Bronchitis (2005), Bursitis of left shoulder (09/06/2019), CKD (chronic kidney disease), CLASS 1-EXOPHTHALMOS-THYROTOXIC (02/08/2007), COVID-19 virus infection (04/28/2022), Diabetes mellitus without complication, Family history of breast cancer, Family history of lung cancer, Family history of prostate cancer, Gastroenteritis (07/10/2007), Genetic testing (07/25/2018), GERD (07/24/2006), GRAVE'S DISEASE (01/01/2008), History of hidradenitis suppurativa, History of kidney stones, History of thrush, HIV DISEASE (07/24/2006), Hyperlipidemia, HYPERTENSION (07/24/2006), Hyperthyroidism (08/2006), Menometrorrhagia, Nephrolithiasis, Panniculitis (05/12/2020), Papillary adenocarcinoma of thyroid, Personal history of chemotherapy, Personal history of radiation therapy, Pneumonia (2005), Port-A-Cath in place (07/12/2018), 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   Surgical History:   Past Surgical History:  Procedure Laterality Date   APPLICATION OF WOUND VAC N/A 01/20/2021   Procedure: APPLICATION OF WOUND VAC;  Surgeon: Peggye Form, DO;  Location: [facility];  Service: Plastics;  Laterality:  N/A;   BREAST EXCISIONAL BIOPSY Right 04/26/2018   right axilla negative   BREAST EXCISIONAL BIOPSY Left 04/26/2018   left axilla negative   BREAST LUMPECTOMY Right 10/03/2018   malignant   BREAST LUMPECTOMY WITH RADIOACTIVE SEED AND SENTINEL LYMPH NODE BIOPSY Right 10/03/2018   Procedure: RIGHT BREAST LUMPECTOMY WITH RADIOACTIVE SEED AND SENTINEL LYMPH NODE MAPPING;  Surgeon: Harriette Bouillon, MD;  Location: [facility];  Service: General;  Laterality: Right;   BREAST SURGERY  1997   Breast Reduction    CYSTOSCOPY W/ URETERAL STENT REMOVAL  11/09/2012   Procedure: CYSTOSCOPY WITH STENT REMOVAL;  Surgeon: Sebastian Ache, MD;  Location: [facility];  Service: Urology;  Laterality: Right;   CYSTOSCOPY WITH RETROGRADE PYELOGRAM, URETEROSCOPY AND STENT PLACEMENT  11/09/2012   Procedure: CYSTOSCOPY WITH RETROGRADE PYELOGRAM, URETEROSCOPY AND STENT PLACEMENT;  Surgeon: Sebastian Ache, MD;  Location: [facility];  Service: Urology;  Laterality: Left;  LEFT URETEROSCOPY, STONE MANIPULATION, left STENT exchange    CYSTOSCOPY WITH STENT PLACEMENT  10/02/2012   Procedure: CYSTOSCOPY WITH STENT PLACEMENT;  Surgeon: Sebastian Ache, MD;  Location: [facility];  Service: Urology;  Laterality: Left;   DEBRIDEMENT AND CLOSURE WOUND N/A 01/20/2021   Procedure: Excision of abdominal wound with closure;  Surgeon: Peggye Form, DO;  Location:  SURGERY CENTER;  Service: Government social research officer;  Laterality: N/A;   DILATION AND CURETTAGE OF UTERUS  11/2002   s/p for 1st trimester nonviable pregnancy   EYE SURGERY     sty under eyelid   INCISE AND DRAIN ABCESS  08/2002   s/p I &D for righ inframmary fold hidradenitis   INCISION AND DRAINAGE PERITONSILLAR ABSCESS  12/2001   IR CV LINE INJECTION  06/07/2018   IR IMAGING GUIDED PORT INSERTION  06/20/2018   IR REMOVAL TUN ACCESS W/ PORT W/O FL MOD SED  06/20/2018   IRRIGATION AND DEBRIDEMENT ABSCESS  01/31/2012   Procedure: IRRIGATION AND DEBRIDEMENT ABSCESS;  Surgeon: Kandis Cocking, MD;  Location: [facility];  Service: General;  Laterality: Right;  right breast and axilla    NEPHROLITHOTOMY  10/02/2012   Procedure: NEPHROLITHOTOMY PERCUTANEOUS;  Surgeon: Sebastian Ache, MD;  Location: [facility];  Service: Urology;  Laterality: Right;  First Stage Percutaneous Nephrolithotomy with Surgeon Access, Left Ureteral Stent     NEPHROLITHOTOMY  10/04/2012   Procedure: NEPHROLITHOTOMY PERCUTANEOUS SECOND LOOK;  Surgeon: Sebastian Ache, MD;  Location: [facility];  Service: Urology;  Laterality: Right;      NEPHROLITHOTOMY  10/08/2012   Procedure: NEPHROLITHOTOMY PERCUTANEOUS;  Surgeon: Sebastian Ache, MD;  Location: [facility];  Service: Urology;  Laterality: Right;  THIRD STAGE, nephrostomy tube exchange x 2   NEPHROLITHOTOMY  10/11/2012   Procedure: NEPHROLITHOTOMY PERCUTANEOUS SECOND LOOK;  Surgeon: Sebastian Ache, MD;  Location: [facility];  Service: Urology;  Laterality: Right;  RIGHT 4 STAGE PERCUTANOUS NEPHROLITHOTOMY, right URETEROSCOPY WITH HOLMIUM LASER    PANNICULECTOMY N/A 12/21/2020   Procedure: PANNICULECTOMY;  Surgeon: Peggye Form, DO;  Location: [facility];  Service: Plastics;  Laterality: N/A;   PERCUTANEOUS NEPHROSTOLITHOTOMY  04/2022   PORT-A-CATH REMOVAL N/A 07/16/2020   Procedure: REMOVAL PORT-A-CATH;  Surgeon: Harriette Bouillon, MD;  Location: [facility];  Service: General;  Laterality: N/A;   PORTACATH PLACEMENT Left 05/17/2018   Procedure: INSERTION PORT-A-CATH;  Surgeon: Abigail Miyamoto, MD;  Location:  SURGERY CENTER;  Service: General;  Laterality: Left;   RADICAL NECK DISSECTION  04/12/2017   limited/notes 04/12/2017   RADICAL NECK DISSECTION N/A 04/12/2017   Procedure: RADICAL NECK DISSECTION;  Surgeon: Christia Reading, MD;  Location: [facility];  Service: ENT;  Laterality: N/A;  limited neck dissection 2 hours total   REDUCTION MAMMAPLASTY Bilateral 1998   RIGHT/LEFT HEART CATH AND CORONARY ANGIOGRAPHY N/A 03/12/2020   Procedure: RIGHT/LEFT  HEART CATH AND CORONARY ANGIOGRAPHY;  Surgeon: Dolores Patty, MD;  Location: [facility];  Service: Cardiovascular;  Laterality: N/A;   [place]  1994   THYROIDECTOMY  04/12/2017   completion/notes 04/12/2017   THYROIDECTOMY N/A 04/12/2017   Procedure: THYROIDECTOMY;  Surgeon: Christia Reading, MD;  Location: [facility];  Service: ENT;  Laterality: N/A;  Completion Thyroidectomy   TOTAL THYROIDECTOMY  2010     Social History:   reports that she quit smoking about 5 years ago. Her smoking use included cigarettes. She started smoking about 5 years ago. She has a 7.50 pack-year smoking history. She has never used smokeless tobacco. She reports current alcohol use. She reports that she does not use drugs.   Family History:  Her family history includes Breast cancer in her cousin, cousin, cousin, maternal aunt, paternal aunt, and paternal aunt; Breast cancer (age of onset: 83) in her cousin; Breast cancer (age of onset: 79) in her paternal aunt; Breast cancer (age of onset: 54) in her maternal aunt;  Cancer in her maternal uncle and mother; Diabetes in an other family member; Heart disease in her mother and another family member; Hypertension in her brother, father, mother, sister, sister, and another family member; Kidney disease in an other family member; Lung cancer in her paternal uncle; Lung cancer (age of onset: 57) in her father; Pancreatic cancer in her father; Prostate cancer in her paternal uncle and paternal uncle; Stroke in an other family member. There is no history of Colon cancer, Esophageal cancer, Rectal cancer, or Stomach cancer.   Allergies Allergies  Allergen Reactions   Genvoya [Elviteg-Cobic-Emtricit-Tenofaf] Hives   Lisinopril Cough   Tape Rash    Rash  Okay with tegaderm and paper tape   Aldactone [Spironolactone] Hives   Tegaderm Ag Mesh [Silver] Itching     Home Medications  Prior to Admission medications   Medication Sig Start Date End Date Taking? Authorizing  Provider  acetaminophen (TYLENOL) 500 MG tablet Take 1,000 mg by mouth every 6 (six) hours as needed for mild pain.   Yes [provider]  albuterol (VENTOLIN HFA) 108 (90 Base) MCG/ACT inhaler Inhale 2 puffs into the lungs every 6 (six) hours as needed for wheezing or shortness of breath. 10/07/20  Yes Hall-Potvin, [place], PA-C  allopurinol (ZYLOPRIM) 100 MG tablet TAKE 1 TABLET BY MOUTH EVERY DAY FOR GOUT PREVENTION Patient taking differently: Take 100 mg by mouth daily. For gout prevention 01/11/23  Yes Doreene Nest, NP  candesartan (ATACAND) 32 MG tablet TAKE 1 TABLET BY MOUTH DAILY. 06/28/22  Yes Chilton Si, MD  carvedilol (COREG) 25 MG tablet Take 1 tablet (25 mg total) by mouth 2 (two) times daily. 02/01/22  Yes Alvstad, Kristin L, RPH-CPP  cetirizine (ZYRTEC) 10 MG tablet TAKE 1 TABLET BY MOUTH AT BEDTIME FOR ALLERGIES Patient taking differently: Take 10 mg by mouth at bedtime. 01/11/23  Yes Doreene Nest, NP  dolutegravir ([name]) 50 MG tablet TAKE 1 TABLET BY MOUTH EVERY DAY 01/11/23  Yes Cliffton Asters, MD  emtricitabine-tenofovir AF (DESCOVY) 200-25 MG tablet TAKE 1 TABLET BY MOUTH EVERY DAY 01/11/23  Yes Cliffton Asters, MD  FLOVENT HFA 110 MCG/ACT inhaler TAKE 1 PUFF BY MOUTH TWICE A DAY Patient taking differently: Inhale 1 puff into the lungs 2 (two) times daily as needed (wheezing/shortness of breath). 08/27/19  Yes Doreene Nest, NP  fluticasone (FLONASE) 50 MCG/ACT nasal spray Place 2 sprays into both nostrils daily. 07/19/21  Yes Eden Emms, NP  gabapentin (NEURONTIN) 300 MG capsule Take 300 mg by mouth daily.   Yes [provider]  glipiZIDE (GLUCOTROL) 10 MG tablet TAKE 1 TABLET BY MOUTH TWICE A DAY FOR DIABETES Patient taking differently: Take 10 mg by mouth 2 (two) times daily before a meal. 11/15/22  Yes Doreene Nest, NP  hydrALAZINE (APRESOLINE) 50 MG tablet TAKE 1 TABLET (50 MG) BY MOUTH IN THE MORNING AND AT BEDTIME Patient  taking differently: Take 50 mg by mouth in the morning and at bedtime. 09/20/22  Yes Chilton Si, MD  levothyroxine (SYNTHROID) 175 MCG tablet TAKE 1 TABLET BY MOUTH EVERY DAY 10/18/22  Yes Carlus Pavlov, MD  minocycline (DYNACIN) 100 MG tablet Take 1 tablet (100 mg total) by mouth 2 (two) times daily. 06/09/22  Yes Cliffton Asters, MD  omeprazole (PRILOSEC) 40 MG capsule Take 1 capsule (40 mg total) by mouth 2 (two) times daily before a meal. 12/19/22  Yes Quentin Mulling R, PA-C  rosuvastatin (CRESTOR) 10 MG tablet TAKE 1 TABLET  BY MOUTH EVERY DAY FOR CHOLESTEROL Patient taking differently: Take 10 mg by mouth daily. FOR CHOLESTEROL 09/20/22  Yes Doreene Nest, NP  sertraline (ZOLOFT) 50 MG tablet TAKE 1 TABLET (50 MG TOTAL) BY MOUTH DAILY. FOR ANXIETY AND DEPRESSION. 11/11/21  Yes Doreene Nest, NP  tamoxifen (NOLVADEX) 20 MG tablet Take 1 tablet (20 mg total) by mouth daily. 07/27/21  Yes Magrinat, Valentino Hue, MD  Blood Glucose Monitoring Suppl (FREESTYLE LITE) w/Device KIT Inject 1 each into the skin daily. 11/10/20   Chilton Si, MD  glucose blood (ONETOUCH VERIO) test strip USE AS INSTRUCTED TO TEST BLOOD SUGAR up to 3 times daily. 11/12/21   Doreene Nest, NP  Lancets [facility] ULTRASOFT) lancets Use as instructed to test blood sugar daily 11/25/20   Doreene Nest, NP     Critical care time: 

## 2023-01-29 NOTE — Progress Notes (Signed)
eLink Physician-Brief Progress Note Patient Name: [name] DOB: 15-Jun-1966 MRN: [id number]   Date of Service  01/29/2023  HPI/Events of Note  Brief new admit note: 57 yo female with pmh t2dm, ckd4, hiv, sarcoidosis admitted to ICU for Type B aortic dissection on esmolol and NTG drips for hypertensive emergency, AKI.   Camera: Obese, on nasal o2. HR 75, SBP 190. Sats 100%.  Data reviewed    eICU Interventions  On SCD SSI- to keep CBG < 180 Asp precautions      Intervention Category Major Interventions: Other: Evaluation Type: New Patient Evaluation  Ranee Gosselin 01/29/2023, 5:36 AM

## 2023-01-29 NOTE — Consult Note (Addendum)
Hospital Consult    Reason for Consult: Type B aortic dissection Requesting Physician: ED MRN #:  [id number]  History of Present Illness: This is a 57 y.o. female who presents as a transfer from [facility] with imaging demonstrating type B aortic dissection.  On exam, [name] was resting comfortably, surrounded by family.  She was on an esmolol drip, nitro drip. Lorijo first appreciated severe abdominal pain in the fall of last year on a trip down to [place].  She was taken to a local hospital, but did not undergo CT scan with contrast due to her chronic kidney disease.  The pain resolved spontaneously.  She has had 2 more episodes since that time, the last being yesterday.  He denies doing any vigorous activity, and states that she felt severe abdominal pain which was similar to the 2 previous episodes.  She denied nausea, vomiting, but stated the abdominal pain, and cramping were severe.  Denied lower extremity symptoms.   Shacoya has a history of chronic kidney disease, and poorly controlled hypertension.  On arrival to the emergency department, her blood pressure was 264/105.  With improvement using esmolol and nitro glycerin, abdominal pain resolved.  On my assessment, she had no abdominal pain, back pain, chest pain. Denied symptoms of stroke, denied lower extremity claudication, ischemic rest pain, tissue loss.  Past Medical History:  Diagnosis Date   Acute pancreatitis 10/23/2021   Acute renal failure superimposed on stage 4 chronic kidney disease 10/24/2021   Allergy    Anemia    Normocytic   Antibiotic-induced yeast infection 09/28/2020   Anxiety    Asthma    Blood dyscrasia    Bronchitis 2005   Bursitis of left shoulder 09/06/2019   CKD (chronic kidney disease)    CLASS 1-EXOPHTHALMOS-THYROTOXIC 02/08/2007   COVID-19 virus infection 04/28/2022   Diabetes mellitus without complication    Family history of breast cancer    Family history of lung cancer    Family history of  prostate cancer    Gastroenteritis 07/10/2007   Genetic testing 07/25/2018   [employer] + RNA [employer]ight was ordered.  Genes Analyzed (43 total): APC*, ATM*, AXIN2, BARD1, BMPR1A, BRCA1*, BRCA2*, BRIP1*, CDH1*, CDK4, CDKN2A, CHEK2*, DICER1, GALNT12, HOXB13, MEN1, MLH1*, MRE11A, MSH2*, MSH3, MSH6*, MUTYH*, NBN, NF1*, NTHL1, PALB2*, PMS2*, POLD1, POLE, PTEN*, RAD50, RAD51C*, RAD51D*, RET, SDHB, SDHD, SMAD4, SMARCA4, STK11 and TP53* (sequencing and deletion/duplication); EGFR (s   GERD 07/24/2006   GRAVE'S DISEASE 01/01/2008   History of hidradenitis suppurativa    History of kidney stones    History of thrush    HIV DISEASE 07/24/2006   dx March 05   Hyperlipidemia    HYPERTENSION 07/24/2006   Hyperthyroidism 08/2006   Grave's Disease -diffuse radiotracer uptake 08/25/06 Thyroid scan-Cold nodule to R lower lobe of thyrorid   Menometrorrhagia    hx of   Nephrolithiasis    Panniculitis 05/12/2020   Papillary adenocarcinoma of thyroid    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 Completed 2 yrs Prednisone after lung bx confirmation. No symptoms since then.   Sebaceous cyst 04/21/2020  Suppurative hidradenitis    Thyroid cancer    THYROID NODULE, RIGHT 02/08/2007    Past Surgical History:  Procedure Laterality Date   APPLICATION OF WOUND VAC N/A 01/20/2021   Procedure: APPLICATION OF WOUND VAC;  Surgeon: Peggye Form, DO;  Location: [facility];  Service: Plastics;  Laterality: N/A;   BREAST EXCISIONAL BIOPSY Right 04/26/2018   right axilla negative    BREAST EXCISIONAL BIOPSY Left 04/26/2018   left axilla negative   BREAST LUMPECTOMY Right 10/03/2018   malignant   BREAST LUMPECTOMY WITH RADIOACTIVE SEED AND SENTINEL LYMPH NODE BIOPSY Right 10/03/2018   Procedure: RIGHT BREAST LUMPECTOMY WITH RADIOACTIVE SEED AND SENTINEL LYMPH NODE MAPPING;  Surgeon: Harriette Bouillon, MD;  Location: [facility];  Service: General;  Laterality: Right;   BREAST SURGERY  1997   Breast Reduction    CYSTOSCOPY W/ URETERAL STENT REMOVAL  11/09/2012   Procedure: CYSTOSCOPY WITH STENT REMOVAL;  Surgeon: Sebastian Ache, MD;  Location: [facility];  Service: Urology;  Laterality: Right;   CYSTOSCOPY WITH RETROGRADE PYELOGRAM, URETEROSCOPY AND STENT PLACEMENT  11/09/2012   Procedure: CYSTOSCOPY WITH RETROGRADE PYELOGRAM, URETEROSCOPY AND STENT PLACEMENT;  Surgeon: Sebastian Ache, MD;  Location: [facility];  Service: Urology;  Laterality: Left;  LEFT URETEROSCOPY, STONE MANIPULATION, left STENT exchange    CYSTOSCOPY WITH STENT PLACEMENT  10/02/2012   Procedure: CYSTOSCOPY WITH STENT PLACEMENT;  Surgeon: Sebastian Ache, MD;  Location: [facility];  Service: Urology;  Laterality: Left;   DEBRIDEMENT AND CLOSURE WOUND N/A 01/20/2021   Procedure: Excision of abdominal wound with closure;  Surgeon: Peggye Form, DO;  Location:  [facility];  Service: Plastics;  Laterality: N/A;   DILATION AND CURETTAGE OF UTERUS  11/2002   s/p for 1st trimester nonviable pregnancy   EYE SURGERY     sty under eyelid   INCISE AND DRAIN ABCESS  08/2002   s/p I &D for righ inframmary fold hidradenitis   INCISION AND DRAINAGE PERITONSILLAR ABSCESS  12/2001   IR CV LINE INJECTION  06/07/2018   IR IMAGING GUIDED PORT INSERTION  06/20/2018   IR REMOVAL TUN ACCESS W/ PORT W/O FL MOD SED  06/20/2018   IRRIGATION AND DEBRIDEMENT ABSCESS  01/31/2012   Procedure: IRRIGATION AND DEBRIDEMENT ABSCESS;  Surgeon: Kandis Cocking, MD;  Location: [facility];  Service: General;  Laterality: Right;  right  breast and axilla    NEPHROLITHOTOMY  10/02/2012   Procedure: NEPHROLITHOTOMY PERCUTANEOUS;  Surgeon: Sebastian Ache, MD;  Location: [facility];  Service: Urology;  Laterality: Right;  First Stage Percutaneous Nephrolithotomy with Surgeon Access, Left Ureteral Stent     NEPHROLITHOTOMY  10/04/2012   Procedure: NEPHROLITHOTOMY PERCUTANEOUS SECOND LOOK;  Surgeon: Sebastian Ache, MD;  Location: [facility];  Service: Urology;  Laterality: Right;      NEPHROLITHOTOMY  10/08/2012   Procedure: NEPHROLITHOTOMY PERCUTANEOUS;  Surgeon: Sebastian Ache, MD;  Location: [facility];  Service: Urology;  Laterality: Right;  THIRD STAGE, nephrostomy tube exchange x 2   NEPHROLITHOTOMY  10/11/2012   Procedure: NEPHROLITHOTOMY PERCUTANEOUS SECOND LOOK;  Surgeon: Sebastian Ache, MD;  Location: [facility];  Service: Urology;  Laterality: Right;  RIGHT 4 STAGE PERCUTANOUS NEPHROLITHOTOMY, right URETEROSCOPY WITH HOLMIUM LASER    PANNICULECTOMY N/A 12/21/2020   Procedure: PANNICULECTOMY;  Surgeon: Peggye Form, DO;  Location: [facility];  Service: Plastics;  Laterality: N/A;   PERCUTANEOUS NEPHROSTOLITHOTOMY  04/2022   PORT-A-CATH REMOVAL N/A 07/16/2020   Procedure: REMOVAL PORT-A-CATH;  Surgeon: Harriette Bouillon, MD;  Location: [facility];  Service: General;  Laterality: N/A;   PORTACATH PLACEMENT Left 05/17/2018   Procedure: INSERTION PORT-A-CATH;  Surgeon: Abigail Miyamoto, MD;  Location: [facility];  Service: General;  Laterality: Left;   RADICAL NECK DISSECTION  04/12/2017   limited/notes 04/12/2017   RADICAL NECK DISSECTION N/A 04/12/2017   Procedure: RADICAL NECK DISSECTION;  Surgeon: Christia Reading, MD;  Location: [facility];  Service: ENT;  Laterality: N/A;  limited neck dissection 2 hours total   REDUCTION MAMMAPLASTY Bilateral 1998   RIGHT/LEFT HEART CATH AND CORONARY ANGIOGRAPHY N/A 03/12/2020   Procedure: RIGHT/LEFT HEART CATH AND CORONARY ANGIOGRAPHY;  Surgeon: Dolores Patty, MD;   [facility]: [facility];  Service: Cardiovascular;  Laterality: N/A;   [place]  1994   THYROIDECTOMY  04/12/2017   completion/notes 04/12/2017   THYROIDECTOMY N/A 04/12/2017   Procedure: THYROIDECTOMY;  Surgeon: Christia Reading, MD;  Location: [facility];  Service: ENT;  Laterality: N/A;  Completion Thyroidectomy   TOTAL THYROIDECTOMY  2010    Allergies  Allergen Reactions   Genvoya [Elviteg-Cobic-Emtricit-Tenofaf] Hives   Lisinopril Cough   Tape Rash    Rash  Okay with tegaderm and paper tape   Aldactone [Spironolactone] Hives   Tegaderm Ag Mesh [Silver] Itching    Prior to Admission medications   Medication Sig Start Date End Date Taking? Authorizing Provider  acetaminophen (TYLENOL) 500 MG tablet Take 1,000 mg by mouth every 6 (six) hours as needed for mild pain.   Yes [provider]  albuterol (VENTOLIN HFA) 108 (90 Base) MCG/ACT inhaler Inhale 2 puffs into the lungs every 6 (six) hours as needed for wheezing or shortness of breath. 10/07/20  Yes Hall-Potvin, [place], PA-C  allopurinol (ZYLOPRIM) 100 MG tablet TAKE 1 TABLET BY MOUTH EVERY DAY FOR GOUT PREVENTION Patient taking differently: Take 100 mg by mouth daily. For gout prevention 01/11/23  Yes Doreene Nest, NP  candesartan (ATACAND) 32 MG tablet TAKE 1 TABLET BY MOUTH DAILY. 06/28/22  Yes Chilton Si, MD  carvedilol (COREG) 25 MG tablet Take 1 tablet (25 mg total) by mouth 2 (two) times daily. 02/01/22  Yes Alvstad, Kristin L, RPH-CPP  cetirizine (ZYRTEC) 10 MG tablet TAKE 1 TABLET BY MOUTH AT BEDTIME FOR ALLERGIES Patient taking differently: Take 10 mg by mouth at bedtime. 01/11/23  Yes Doreene Nest, NP  dolutegravir ([name]) 50 MG tablet TAKE 1 TABLET BY MOUTH EVERY DAY 01/11/23  Yes Cliffton Asters, MD  emtricitabine-tenofovir AF (DESCOVY) 200-25 MG tablet TAKE 1 TABLET BY MOUTH EVERY DAY 01/11/23  Yes Cliffton Asters, MD  FLOVENT HFA 110 MCG/ACT inhaler TAKE 1 PUFF BY MOUTH TWICE A DAY Patient taking  differently: Inhale 1 puff into the lungs 2 (two) times daily as needed (wheezing/shortness of breath). 08/27/19  Yes Doreene Nest, NP  fluticasone (FLONASE) 50 MCG/ACT nasal spray Place 2 sprays into both nostrils daily. 07/19/21  Yes Eden Emms, NP  gabapentin (NEURONTIN) 300 MG capsule Take 300 mg by mouth daily.   Yes [provider]  glipiZIDE (GLUCOTROL) 10 MG tablet TAKE 1 TABLET BY MOUTH TWICE A DAY FOR DIABETES Patient taking differently: Take 10 mg by mouth 2 (two) times daily before a meal. 11/15/22  Yes Doreene Nest, NP  hydrALAZINE (APRESOLINE) 50 MG tablet TAKE 1 TABLET (50 MG) BY MOUTH IN THE MORNING AND AT BEDTIME Patient taking differently: Take 50 mg by mouth in the morning and at bedtime. 09/20/22  Yes Chilton Si,  MD  levothyroxine (SYNTHROID) 175 MCG tablet TAKE 1 TABLET BY MOUTH EVERY DAY 10/18/22  Yes Carlus Pavlov, MD  minocycline (DYNACIN) 100 MG tablet Take 1 tablet (100 mg total) by mouth 2 (two) times daily. 06/09/22  Yes Cliffton Asters, MD  omeprazole (PRILOSEC) 40 MG capsule Take 1 capsule (40 mg total) by mouth 2 (two) times daily before a meal. 12/19/22  Yes Quentin Mulling R, PA-C  rosuvastatin (CRESTOR) 10 MG tablet TAKE 1 TABLET BY MOUTH EVERY DAY FOR CHOLESTEROL Patient taking differently: Take 10 mg by mouth daily. FOR CHOLESTEROL 09/20/22  Yes Doreene Nest, NP  sertraline (ZOLOFT) 50 MG tablet TAKE 1 TABLET (50 MG TOTAL) BY MOUTH DAILY. FOR ANXIETY AND DEPRESSION. 11/11/21  Yes Doreene Nest, NP  tamoxifen (NOLVADEX) 20 MG tablet Take 1 tablet (20 mg total) by mouth daily. 07/27/21  Yes Magrinat, Valentino Hue, MD  Blood Glucose Monitoring Suppl (FREESTYLE LITE) w/Device KIT Inject 1 each into the skin daily. 11/10/20   Chilton Si, MD  glucose blood (ONETOUCH VERIO) test strip USE AS INSTRUCTED TO TEST BLOOD SUGAR up to 3 times daily. 11/12/21   Doreene Nest, NP  Lancets [facility] ULTRASOFT) lancets Use as instructed to  test blood sugar daily 11/25/20   Doreene Nest, NP    Social History   Socioeconomic History   Marital status: Single    Spouse name: Not on file   Number of children: 1   Years of education: Not on file   Highest education level: Not on file  Occupational History   Occupation: Investment banker, corporate  Tobacco Use   Smoking status: Former    Packs/day: 0.50    Years: 15.00    Additional pack years: 0.00    Total pack years: 7.50    Types: Cigarettes    Start date: 04/12/2017    Quit date: 2019    Years since quitting: 5.2   Smokeless tobacco: Never  Vaping Use   Vaping Use: Never used  Substance and Sexual Activity   Alcohol use: Yes    Alcohol/week: 0.0 standard drinks of alcohol    Comment: social   Drug use: No   Sexual activity: Not Currently    Birth control/protection: Post-menopausal    Comment: declined condoms  Other Topics Concern   Not on file  Social History Narrative   Right Handed    Lives in a two story home   Social Determinants of Health   Financial Resource Strain: Not on file  Food Insecurity: Not on file  Transportation Needs: No Transportation Needs (10/25/2018)   PRAPARE - Administrator, Civil Service (Medical): No    Lack of Transportation (Non-Medical): No  Physical Activity: Not on file  Stress: Not on file  Social Connections: Not on file  Intimate Partner Violence: Not At Risk (10/25/2018)   Humiliation, Afraid, Rape, and Kick questionnaire    Fear of Current or Ex-Partner: No    Emotionally Abused: No    Physically Abused: No    Sexually Abused: No   Family History  Problem Relation Age of Onset   Hypertension Mother    Cancer Mother        laryngeal   Heart disease Mother        stent   Pancreatic cancer Father    Hypertension Father    Lung cancer Father 65       hx smoking   Hypertension Sister    Hypertension Sister  Hypertension Brother    Breast cancer Maternal Aunt 33   Breast cancer Maternal Aunt         dx 60+   Cancer Maternal Uncle        Lung CA   Breast cancer Paternal Aunt 98   Breast cancer Paternal Aunt        dx 10's   Breast cancer Paternal Aunt        dx 34's   Prostate cancer Paternal Uncle    Prostate cancer Paternal Uncle    Lung cancer Paternal Uncle    Breast cancer Cousin 78   Breast cancer Cousin        dx <50   Breast cancer Cousin        dx <50   Breast cancer Cousin        dx <50   Heart disease Other    Hypertension Other    Stroke Other        Grandparent   Kidney disease Other        Grandparent   Diabetes Other        FH of Diabetes   Colon cancer Neg Hx    Esophageal cancer Neg Hx    Rectal cancer Neg Hx    Stomach cancer Neg Hx     ROS: Otherwise negative unless mentioned in HPI  Physical Examination  Vitals:   01/29/23 0545 01/29/23 0550  BP: (!) 178/110 (!) 199/113  Pulse: (!) 59 (!) 55  Resp: 13 13  Temp:    SpO2: 100% 100%   Body mass index is 35.29 kg/m.  General:  WDWN in NAD, tired Gait: Not observed HENT: WNL, normocephalic Pulmonary: normal non-labored breathing, without Rales, rhonchi,  wheezing Cardiac: regular Abdomen: soft, NT/ND, no masses Skin: without rashes Vascular Exam/Pulses: 2+ DP pulses, 2+ radial pulses Extremities: without ischemic changes, without Gangrene , without cellulitis; without open wounds;  Musculoskeletal: no muscle wasting or atrophy  Neurologic: A&O X 3;  No focal weakness or paresthesias are detected; speech is fluent/normal Psychiatric:  The pt has Normal affect. Lymph:  Unremarkable  CBC    Component Value Date/Time   WBC 14.2 (H) 01/28/2023 2358   RBC 2.65 (L) 01/28/2023 2358   HGB 8.7 (L) 01/28/2023 2358   HGB 11.7 (L) 07/27/2021 1110   HCT 25.7 (L) 01/28/2023 2358   PLT 180 01/28/2023 2358   PLT 228 07/27/2021 1110   MCV 97.0 01/28/2023 2358   MCH 32.8 01/28/2023 2358   MCHC 33.9 01/28/2023 2358   RDW 16.4 (H) 01/28/2023 2358   LYMPHSABS 1.6 12/19/2022 1242   MONOABS 0.7  12/19/2022 1242   EOSABS 0.2 12/19/2022 1242   BASOSABS 0.0 12/19/2022 1242    BMET    Component Value Date/Time   NA 139 01/28/2023 2358   NA 142 12/05/2022 0000   K 3.7 01/28/2023 2358   CL 102 01/28/2023 2358   CO2 24 01/28/2023 2358   GLUCOSE 156 (H) 01/28/2023 2358   BUN 54 (H) 01/28/2023 2358   BUN 44 (A) 12/05/2022 0000   CREATININE 5.99 (H) 01/28/2023 2358   CREATININE 2.72 (H) 07/27/2021 1110   CREATININE 2.74 (H) 03/17/2021 1050   CALCIUM 9.3 01/28/2023 2358   GFRNONAA 8 (L) 01/28/2023 2358   GFRNONAA 20 (L) 07/27/2021 1110   GFRAA 39 (L) 07/14/2020 1053   GFRAA 33 (L) 08/12/2019 1447    COAGS: Lab Results  Component Value Date   INR 1.0 07/14/2019  INR 1.08 06/20/2018   INR 1.0 09/10/2008      ASSESSMENT/PLAN: This is a 57 y.o. female who presents with recurrent symptoms of severe abdominal pain which have happened twice before.  Imaging demonstrates type B aortic dissection beginning in zone 4, carrying through the left common femoral artery.  All visceral vessels opacify, lower extremities opacify below the level of the dissection.  She is now asymptomatic with blood pressure control.  Abdomen soft.  I had a long conversation with [name] and her family at bedside.  Specifically we discussed the natural history of type B aortic dissection, as well as sequela associated.  She has some dilation of the supraceliac aorta at the level of the dissection, which could be a sign that the initial dissection occurred back in the fall, when she had the first episode of pain.  With continued hypertension, there is a chance that this event was propagation of the dissection flap.  Regardless, being that she is asymptomatic now, I plan to treat this medically.  [name] appears to have acute kidney injury on top of her stage V chronic kidney disease.  I think that this event, and necessary contrast, will likely pressure of the renal failure.  I do not think there is a role for renal  salvage, as her CKD has been advanced for a number of months.  Overall, [name] would benefit from continued medical management, antiimpulse control.  I would like her blood pressure under 120 systolic, heart rate less than 80.  I think she would benefit from an arterial line.  I appreciate critical care admitting the patient, and plan to discuss her kidney disease with nephrology.  [name] was asked to notify nursing immediately should her abdominal pain recur.  If this pain recurs in the setting of adequate antiimpulse control, I would discuss operative intervention to cover the proximal portion of the dissection.  Plan for reimaging at the 48-hour mark   Felicia Olden MD [facility] [phone] 01/29/2023  6:25 AM

## 2023-01-29 NOTE — ED Notes (Signed)
180/90 will keep drips at current rates.

## 2023-01-29 NOTE — ED Notes (Signed)
Teacher, English as a foreign language at [employer]; Pt provided verbal consent to transfer; family at bedside.

## 2023-01-29 NOTE — ED Provider Notes (Signed)
[place] EMERGENCY DEPARTMENT AT [facility] Provider Note   CSN: [id number] Arrival date & time: 01/28/23  2337     History  Chief Complaint  Patient presents with   Back Pain   Shortness of Breath   Emesis    [name] is a 57 y.o. female.  HPI     This is a 57 year old female with a history of chronic renal disease, HIV, sarcoidosis, diabetes who presents with back pain, shortness of breath, vomiting.  Reports onset of lower back pain that started yesterday.  She states it hurts across her lower back.  She also has pain in her abdomen that seems to radiate backwards.  She has had 1 episode of similar pain in the past and was not given a diagnosis.  She has a history of [place] kidney stones.  Denies urinary symptoms or hematuria.  No fevers.  Reports episodes of nonbilious, nonbloody emesis.  Reports compliance with medications.  She is notably hypertensive.  States she has taken all of her blood pressure medications today.  Home Medications Prior to Admission medications   Medication Sig Start Date End Date Taking? Authorizing Provider  acetaminophen (TYLENOL) 500 MG tablet Take 1,000 mg by mouth every 6 (six) hours as needed for mild pain.    [provider]  albuterol (VENTOLIN HFA) 108 (90 Base) MCG/ACT inhaler Inhale 2 puffs into the lungs every 6 (six) hours as needed for wheezing or shortness of breath. 10/07/20   Hall-Potvin, [place], PA-C  allopurinol (ZYLOPRIM) 100 MG tablet TAKE 1 TABLET BY MOUTH EVERY DAY FOR GOUT PREVENTION 01/11/23   Doreene Nest, NP  Blood Glucose Monitoring Suppl (FREESTYLE LITE) w/Device KIT Inject 1 each into the skin daily. 11/10/20   Chilton Si, MD  candesartan (ATACAND) 32 MG tablet TAKE 1 TABLET BY MOUTH DAILY. 06/28/22   Chilton Si, MD  carvedilol (COREG) 25 MG tablet Take 1 tablet (25 mg total) by mouth 2 (two) times daily. 02/01/22   Al[place], [place], RPH-CPP  cetirizine (ZYRTEC) 10 MG  tablet TAKE 1 TABLET BY MOUTH AT BEDTIME FOR ALLERGIES 01/11/23   Doreene Nest, NP  dolutegravir ([name]) 50 MG tablet TAKE 1 TABLET BY MOUTH EVERY DAY 01/11/23   Cliffton Asters, MD  emtricitabine-tenofovir AF (DESCOVY) 200-25 MG tablet TAKE 1 TABLET BY MOUTH EVERY DAY 01/11/23   Cliffton Asters, MD  FLOVENT HFA 110 MCG/ACT inhaler TAKE 1 PUFF BY MOUTH TWICE A DAY Patient taking differently: Inhale 1 puff into the lungs 2 (two) times daily as needed (wheezing/shortness of breath). 08/27/19   Doreene Nest, NP  fluticasone (FLONASE) 50 MCG/ACT nasal spray Place 2 sprays into both nostrils daily. 07/19/21   Eden Emms, NP  gabapentin (NEURONTIN) 300 MG capsule Take 300 mg by mouth daily.    [provider]  glipiZIDE (GLUCOTROL) 10 MG tablet TAKE 1 TABLET BY MOUTH TWICE A DAY FOR DIABETES 11/15/22   Doreene Nest, NP  glucose blood (ONETOUCH VERIO) test strip USE AS INSTRUCTED TO TEST BLOOD SUGAR up to 3 times daily. 11/12/21   Doreene Nest, NP  hydrALAZINE (APRESOLINE) 50 MG tablet TAKE 1 TABLET (50 MG) BY MOUTH IN THE MORNING AND AT BEDTIME 09/20/22   Chilton Si, MD  Lancets [facility] ULTRASOFT) lancets Use as instructed to test blood sugar daily 11/25/20   Doreene Nest, NP  levothyroxine (SYNTHROID) 175 MCG tablet TAKE 1 TABLET BY MOUTH EVERY DAY 10/18/22   Carlus Pavlov, MD  minocycline (DYNACIN) 100 MG tablet Take 1 tablet (100 mg total) by mouth 2 (two) times daily. 06/09/22   Cliffton Asters, MD  omeprazole (PRILOSEC) 40 MG capsule Take 1 capsule (40 mg total) by mouth 2 (two) times daily before a meal. 12/19/22   Doree Albee, PA-C  rosuvastatin (CRESTOR) 10 MG tablet TAKE 1 TABLET BY MOUTH EVERY DAY FOR CHOLESTEROL 09/20/22   Doreene Nest, NP  sertraline (ZOLOFT) 50 MG tablet TAKE 1 TABLET (50 MG TOTAL) BY MOUTH DAILY. FOR ANXIETY AND DEPRESSION. 11/11/21   Doreene Nest, NP  tamoxifen (NOLVADEX) 20 MG tablet Take 1 tablet (20 mg total) by  mouth daily. 07/27/21   Magrinat, Valentino Hue, MD      Allergies    Genvoya [elviteg-cobic-emtricit-tenofaf], Lisinopril, Tape, and Tegaderm ag mesh [silver]    Review of Systems   Review of Systems  Constitutional:  Negative for fever.  Gastrointestinal:  Positive for abdominal pain, nausea and vomiting.  Musculoskeletal:  Positive for back pain.  All other systems reviewed and are negative.   Physical Exam Updated Vital Signs BP (!) 145/112 (BP Location: Left Arm)   Pulse 66   Temp 98.2 F (36.8 C)   Resp 20   Ht 1.651 m (5\' 5" )   Wt 96.2 kg   LMP 03/31/2014 (LMP Unknown)   SpO2 100%   BMI 35.28 kg/m  Physical Exam Vitals and nursing note reviewed.  Constitutional:      Appearance: She is well-developed. She is obese. She is ill-appearing. She is not toxic-appearing.  HENT:     Head: Normocephalic and atraumatic.     Mouth/Throat:     Mouth: Mucous membranes are moist.  Eyes:     Pupils: Pupils are equal, round, and reactive to light.  Cardiovascular:     Rate and Rhythm: Normal rate and regular rhythm.     Heart sounds: Normal heart sounds.  Pulmonary:     Effort: Pulmonary effort is normal. No respiratory distress.     Breath sounds: No wheezing.  Abdominal:     General: Bowel sounds are normal.     Palpations: Abdomen is soft.     Comments: Generalized tenderness to palpation, no rebound or guarding  Musculoskeletal:     Cervical back: Neck supple.     Right lower leg: Edema present.     Left lower leg: Edema present.  Skin:    General: Skin is warm and dry.  Neurological:     Mental Status: She is alert and oriented to person, place, and time.  Psychiatric:        Mood and Affect: Mood normal.     ED Results / Procedures / Treatments   Labs (all labs ordered are listed, but only abnormal results are displayed) Labs Reviewed  COMPREHENSIVE METABOLIC PANEL - Abnormal; Notable for the following components:      Result Value   Glucose, Bld 156 (*)     BUN 54 (*)    Creatinine, Ser 5.99 (*)    GFR, Estimated 8 (*)    All other components within normal limits  CBC - Abnormal; Notable for the following components:   WBC 14.2 (*)    RBC 2.65 (*)    Hemoglobin 8.7 (*)    HCT 25.7 (*)    RDW 16.4 (*)    All other components within normal limits  LIPASE, BLOOD  URINALYSIS, ROUTINE W REFLEX MICROSCOPIC  RAPID URINE DRUG SCREEN, HOSP PERFORMED  EKG None  Radiology CT Angio Chest/Abd/Pel for Dissection W and/or Wo Contrast  Result Date: 01/29/2023 CLINICAL DATA:  Abdominal pain and back pain, rule out dissection EXAM: CT ANGIOGRAPHY CHEST, ABDOMEN AND PELVIS TECHNIQUE: CT renal stone protocol 01/29/2023 and CT abdomen and pelvis 08/31/2022 Multidetector CT imaging through the chest, abdomen and pelvis was performed using the standard protocol during bolus administration of intravenous contrast. Multiplanar reconstructed images and MIPs were obtained and reviewed to evaluate the vascular anatomy. RADIATION DOSE REDUCTION: This exam was performed according to the departmental dose-optimization program which includes automated exposure control, adjustment of the mA and/or kV according to patient size and/or use of iterative reconstruction technique. CONTRAST:  OMNIPAQUE IOHEXOL 350 MG/ML SOLN COMPARISON:  None Available. 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 There is a windsock sign in the mid thoracic descending aorta (circa series 5/image 81) compatible with intimointimal intussusception. The dissection flap extends along the right aortic sidewall into the abdominal aorta. The lower thoracic descending aorta near the hiatus measures 44 mm in diameter and is similar in size to CT 01/29/2023 at 1:04 a.m. given differences in measuring technique. Mild wall thickening of the wall of the aortic arch. Small pericardial  effusion. Cardiomegaly. The main pulmonary artery is mildly dilated measuring 35 mm in diameter. Mediastinum/Nodes: Unremarkable esophagus. Prominent mediastinal lymph nodes. For example a right paratracheal node measures 1.5 cm in short axis (5/51). These are similar to slightly increased from 09/27/2018 and favored reactive. Normal thyroid. Lungs/Pleura: Interlobular septal thickening greatest in the lower lungs with patchy ground-glass opacities peripherally. Atelectasis or consolidation in the left lower lobe. Musculoskeletal: No acute abnormality. Review of the MIP images confirms the above findings. CTA ABDOMEN AND PELVIS FINDINGS VASCULAR Aorta: The type B descending thoracic aortic dissection continues into the abdomen extending along the right lateral wall of the aorta likely into the right common iliac artery, external iliac artery, and into the common femoral artery with termination at the bifurcation into the superficial femoral and profunda femoral artery. Celiac: The dissection flap abuts the celiac axis which is patent and arises from the true lumen. SMA: The dissection flap appears to cover the origin of the SMA which appears to arise from the false lumen. The SMA is well opacified. Renals: The left renal artery is patent and arises from the true lumen. The right renal artery arises from the false lumen and is patent and opacified. IMA: The IMA is patent and arises from the true lumen. Inflow: The dissection flap continues through the left common iliac, external iliac, and common femoral artery terminating at the bifurcation into the superficial and profunda femoral arteries. The left internal iliac artery is not opacified likely occluded. , Definitive the right common iliac artery appears to arise from the true lumen. The right common iliac, external iliac, and internal iliac arteries are patent. Common femoral artery is patent on the right. Veins: No obvious venous abnormality within the  limitations of this arterial phase study. Review of the MIP images confirms the above findings. NON-VASCULAR Hepatobiliary: No focal liver abnormality is seen. No gallstones, gallbladder wall thickening, or biliary dilatation. Pancreas: Unremarkable. No pancreatic ductal dilatation or surrounding inflammatory changes. Spleen: Normal in size without focal abnormality. Adrenals/Urinary Tract: Stable adrenal glands. Cortical renal scarring right kidney. Nonobstructing right renal calculi. No hydronephrosis. Unremarkable bladder. Stomach/Bowel: Normal caliber large and small bowel. No bowel wall thickening  to suggest ischemia. Stomach is within normal limits. Lymphatic: No lymphadenopathy. Reproductive: Uterus and bilateral adnexa are unremarkable. Other: No free intraperitoneal fluid or air. Musculoskeletal: No acute osseous abnormality. Review of the MIP images confirms the above findings. IMPRESSION: 1. 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 There is a windsock sign in the mid descending thoracic aorta compatible with intimointimal intussusception. The dissection flap extends into the abdominal aorta, right common iliac artery, right external iliac artery, and left common femoral artery terminating at the bifurcation into the superficial and profunda femoral arteries. The left internal iliac artery is not opacified likely occluded. The celiac axis, left renal artery, and IMA arise from the true lumen. The right renal artery and probably the SMA arise from the false lumen. 2. Mild wall thickening of the aortic arch. 3. Cardiomegaly with small pericardial effusion. 4. Mildly dilated main pulmonary artery which can be seen in the setting of pulmonary hypertension. 5. Interlobular septal thickening and patchy ground-glass opacities greatest in the lower lungs concerning for pulmonary edema. Trace bilateral pleural effusions. 6.  Nonobstructing right renal calculi. Critical Value/emergent results were called by telephone at the time of interpretation on 01/29/2023 at 2:39 am to provider [facility] , who verbally acknowledged these results. Electronically Signed   By: Minerva Fester M.D.   On: 01/29/2023 02:39   CT Renal Stone Study  Result Date: 01/29/2023 CLINICAL DATA:  lower back pain that started yesterday and multiple episodes of emesis today. Pt also endorses increases SOB. HX of similar pain back in September, unknown etiology. Hx kidney stones. EXAM: CT ABDOMEN AND PELVIS WITHOUT CONTRAST TECHNIQUE: Multidetector CT imaging of the abdomen and pelvis was performed following the standard protocol without IV contrast. RADIATION DOSE REDUCTION: This exam was performed according to the departmental dose-optimization program which includes automated exposure control, adjustment of the mA and/or kV according to patient size and/or use of iterative reconstruction technique. COMPARISON:  CT abdomen pelvis 10/31/2021 FINDINGS: Lower chest: Bilateral trace pleural effusions. Passive atelectasis. Cardiac findings suggestive of anemia. 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 Hepatobiliary: No focal liver abnormality. No gallstones, gallbladder wall thickening, or pericholecystic fluid. No biliary dilatation. Pancreas: No focal lesion. Normal pancreatic contour. No surrounding inflammatory changes. No main pancreatic ductal dilatation. Spleen: Normal in size without focal abnormality. Adrenals/Urinary Tract: No adrenal nodule bilaterally. Right nephrolithiasis measuring up to 7 mm. No left nephrolithiasis. No ureterolithiasis. The urinary bladder is unremarkable. Stomach/Bowel: Stomach is within normal limits. No evidence of bowel wall thickening or dilatation. Colonic diverticulosis. Appendix appears normal. Vascular/Lymphatic: Interval development of  dissection of the infrarenal abdominal aorta (2:39) that extends to at least the left common iliac artery (2:50). No abdominal aorta or iliac aneurysm. Question slight periaortic fat stranding (2:42). Mild atherosclerotic plaque of the aorta and its branches. No abdominal, pelvic, or inguinal lymphadenopathy. Reproductive: Uterus and bilateral adnexa are unremarkable. Other: No intraperitoneal free fluid. No intraperitoneal free gas. No organized fluid collection. Musculoskeletal: No abdominal wall hernia or abnormality. No suspicious lytic or blastic osseous lesions. No acute displaced fracture. Multilevel degenerative changes of the spine. IMPRESSION: 1. Findings suggestive of interval development of dissection of the infrarenal abdominal aorta likely extending to the left common iliac artery. 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 Recommend  emergent CTA of the chest, abdomen, pelvis (OK to do even though Creatinine of 6 as benefits > risks). 2. Nonobstructive right nephrolithiasis measuring up to 7 mm. 3. Colonic diverticulosis with no acute diverticulitis. 4. Bilateral trace pleural effusions. These results were called by telephone at the time of interpretation on 01/29/2023 at 1:36 am to provider [facility] , who verbally acknowledged these results. Electronically Signed   By: Tish Frederickson M.D.   On: 01/29/2023 01:40    Procedures .Critical Care  Performed by: Shon Baton, MD Authorized by: Shon Baton, MD   Critical care provider statement:    Critical care time (minutes):  75   Critical care was necessary to treat or prevent imminent or life-threatening deterioration of the following conditions: Aortic dissection, hypertensive emergency.   Critical care was time spent personally by me on the following activities:  Development of treatment plan with patient or surrogate, discussions with consultants, evaluation of  patient's response to treatment, examination of patient, ordering and review of laboratory studies, ordering and review of radiographic studies, ordering and performing treatments and interventions, pulse oximetry, re-evaluation of patient's condition and review of old charts Ultrasound ED Peripheral IV (Provider)  Date/Time: 01/29/2023 2:53 AM  Performed by: Shon Baton, MD Authorized by: Shon Baton, MD   Procedure details:    Indications: poor IV access     Skin Prep: chlorhexidine gluconate     Location: left ej.   Angiocath:  18 G   Bedside Ultrasound Guided: No     Images: not archived     Patient tolerated procedure without complications: Yes     Dressing applied: Yes       Medications Ordered in ED Medications  ondansetron (ZOFRAN) injection 4 mg (has no administration in time range)  esmolol (BREVIBLOC) 2000 mg / 100 mL (20 mg/mL) infusion (75 mcg/kg/min  96.2 kg Intravenous Rate/Dose Change 01/29/23 0224)  nitroGLYCERIN 50 mg in dextrose 5 % 250 mL (0.2 mg/mL) infusion (30 mcg/min Intravenous Rate/Dose Change 01/29/23 0222)  HYDROmorphone (DILAUDID) injection 1 mg (has no administration in time range)  ondansetron (ZOFRAN) injection 4 mg (4 mg Intravenous Given 01/29/23 0002)  sodium chloride 0.9 % bolus 1,000 mL (1,000 mLs Intravenous New Bag/Given 01/29/23 0028)  morphine (PF) 4 MG/ML injection 4 mg (4 mg Intravenous Given 01/29/23 0026)  HYDROmorphone (DILAUDID) injection 1 mg (1 mg Intravenous Given 01/29/23 0125)  iohexol (OMNIPAQUE) 350 MG/ML injection 100 mL (100 mLs Intravenous Contrast Given 01/29/23 0202)    ED Course/ Medical Decision Making/ A&P Clinical Course as of 01/29/23 0253  Sun Jan 29, 2023  0145 I reviewed CT scans independently.  Inferior thoracic aorta appears enlarged with some haziness.  I compared to prior CT from November 2023.  Enlargement is similar but was not commented on.  Did receive a phone call from radiology that noncontrasted  scan is concerning for possible dissection.  Patient is severely hypertensive despite pain medication.  Heart rate is in the 60s.  Will start esmolol and Cardene with an attempt to lower blood pressure.  Patient was sent back to CT scanner for a contrasted scan.  Advised the patient that her creatinine is close to 6.  However, given possible life-threatening dissection, she needs a contrasted study.  Patient understands.  I suspect part of her increasing creatinine may be related to renal artery involvement as well. [CH]  0210 Spoke with Dr. Kathe Becton, vascular surgery.  He has reviewed images.  Recommends ED  to ED transfer.  It does appear that she has inferior thoracic involvement; however, hold off on cardiothoracic consultation until evaluated by vascular surgery at [facility]  Prefers nitroglycerin over Cardene.  This has been ordered.  Patient remains hypertensive.  Additional pain medications ordered. [CH]    Clinical Course User Index [CH] Dartha Rozzell, Mayer Masker, MD                             Medical Decision Making Amount and/or Complexity of Data Reviewed Labs: ordered. Radiology: ordered.  Risk Prescription drug management.   This patient presents to the ED for concern of back pain, abdominal pain, this involves an extensive number of treatment options, and is a complaint that carries with it a high risk of complications and morbidity.  I considered the following differential and admission for this acute, potentially life threatening condition.  The differential diagnosis includes kidney stone, pyelonephritis, AAA, colitis  MDM:    This is a 57 year old female with history of diabetes, chronic kidney disease, HIV who presents with back pain.  Onset yesterday.  Also having some abdominal discomfort that she describes as radiating to the back.  She has had some emesis.  She has had previous symptoms once before without any known etiology.  She is severely hypertensive.  Reports compliance  with her blood pressure medication but has been vomiting recently.  Patient was given pain and nausea medication.  Basic lab work obtained.  Lab work notable for elevated creatinine.  Most recent baseline around 4.  Currently 5.99.  Also has a leukocytosis and a downtrending hemoglobin.  Given her history, I would have some concern for dissection especially given her hypertension.  Initially noncontrasted scan was obtained.  I reviewed these independently.  She does have slightly dilated inferior thoracic and inferior abdominal aorta although this has not previously been commented on.  Radiology concern for dissection.  Discussed need for contrasted scan with the patient who agrees.  Contrasted scan obtained.  These images were independently reviewed and show what appears to be a thoracic and intra-abdominal dissection.  See discussion above.  Patient was started on esmolol and nitro.  She was emergently transferred to [facility] for vascular surgery evaluation.  (Labs, imaging, consults)  Labs: I Ordered, and personally interpreted labs.  The pertinent results include: CBC, CMP, lipase  Imaging Studies ordered: I ordered imaging studies including CT head Noncon abdomen, CT chest abdomen pelvis dissection I independently visualized and interpreted imaging. I agree with the radiologist interpretation  Additional history obtained from daughter at bedside.  External records from outside source obtained and reviewed including prior imaging  Cardiac Monitoring: The patient was maintained on a cardiac monitor.  If on the cardiac monitor, I personally viewed and interpreted the cardiac monitored which showed an underlying rhythm of: Sinus rhythm  Reevaluation: After the interventions noted above, I reevaluated the patient and found that they have :stayed the same  Social Determinants of Health:  lives independently  Disposition: transfer for vascular surgery evaluation  Co morbidities that  complicate the patient evaluation  Past Medical History:  Diagnosis Date   Acute pancreatitis 10/23/2021   Acute renal failure superimposed on stage 4 chronic kidney disease 10/24/2021   Allergy    Anemia    Normocytic   Antibiotic-induced yeast infection 09/28/2020   Anxiety    Asthma    Blood dyscrasia    Bronchitis 2005   Bursitis of left  shoulder 09/06/2019   CKD (chronic kidney disease)    CLASS 1-EXOPHTHALMOS-THYROTOXIC 02/08/2007   COVID-19 virus infection 04/28/2022   Diabetes mellitus without complication    Family history of breast cancer    Family history of lung cancer    Family history of prostate cancer    Gastroenteritis 07/10/2007   Genetic testing 07/25/2018   [employer] + RNA [employer]ight was ordered.  Genes Analyzed (43 total): APC*, ATM*, AXIN2, BARD1, BMPR1A, BRCA1*, BRCA2*, BRIP1*, CDH1*, CDK4, CDKN2A, CHEK2*, DICER1, GALNT12, HOXB13, MEN1, MLH1*, MRE11A, MSH2*, MSH3, MSH6*, MUTYH*, NBN, NF1*, NTHL1, PALB2*, PMS2*, POLD1, POLE, PTEN*, RAD50, RAD51C*, RAD51D*, RET, SDHB, SDHD, SMAD4, SMARCA4, STK11 and TP53* (sequencing and deletion/duplication); EGFR (s   GERD 07/24/2006   GRAVE'S DISEASE 01/01/2008   History of hidradenitis suppurativa    History of kidney stones    History of thrush    HIV DISEASE 07/24/2006   dx March 05   Hyperlipidemia    HYPERTENSION 07/24/2006   Hyperthyroidism 08/2006   Grave's Disease -diffuse radiotracer uptake 08/25/06 Thyroid scan-Cold nodule to R lower lobe of thyrorid   Menometrorrhagia    hx of   Nephrolithiasis    Panniculitis 05/12/2020   Papillary adenocarcinoma of thyroid    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 Completed 2 yrs Prednisone after lung bx confirmation. No symptoms since then.   Sebaceous cyst 04/21/2020   Suppurative hidradenitis    Thyroid cancer    THYROID NODULE, RIGHT 02/08/2007     Medicines Meds ordered this encounter  Medications   ondansetron (ZOFRAN) injection 4 mg   sodium chloride 0.9 % bolus 1,000 mL   morphine (PF) 4 MG/ML injection 4 mg   ondansetron (ZOFRAN) injection 4 mg   DISCONTD: morphine (PF) 4 MG/ML injection 4 mg   HYDROmorphone (DILAUDID) injection 1 mg   esmolol (BREVIBLOC) 2000 mg / 100 mL (20 mg/mL) infusion   DISCONTD: nicardipine (CARDENE)  in 0.86% saline IV infusion (0.1 mg/ml)   iohexol (OMNIPAQUE) 350 MG/ML injection 100 mL   nitroGLYCERIN 50 mg in dextrose 5 % 250 mL (0.2 mg/mL) infusion   HYDROmorphone (DILAUDID) injection 1 mg    I have reviewed the patients home medicines and have made adjustments as needed  Problem List / ED Course: Problem List Items Addressed This Visit   None Visit Diagnoses     Dissecting abdominal aortic aneurysm    -  Primary   Relevant Medications   esmolol (BREVIBLOC) 2000 mg / 100 mL (20 mg/mL) infusion   nitroGLYCERIN 50 mg in dextrose 5 % 250 mL (0.2 mg/mL) infusion   Hypertensive emergency       Relevant Medications   esmolol (BREVIBLOC) 2000 mg / 100 mL (20 mg/mL) infusion   nitroGLYCERIN 50 mg in dextrose 5 % 250 mL (0.2 mg/mL) infusion                   Final Clinical Impression(s) / ED Diagnoses Final diagnoses:  Dissecting abdominal aortic aneurysm  Hypertensive emergency    Rx / DC Orders ED Discharge Orders  None         Shon Baton, MD 01/29/23 [phone]

## 2023-01-29 NOTE — ED Notes (Signed)
[facility] at bedside for transportation.  ?

## 2023-01-29 NOTE — Consult Note (Signed)
[facility]  INPATIENT CONSULTATION  Reason for Consultation: AKI on CKD Requesting Provider: Dr. Karin Lieu  HPI: [name] is an 57 y.o. female with CKD 4/5, HIV, HTN, DM, sarcoidosis currently admitted for type B aortic dissection and nephrology is consulted for AKI on CKD.    Several month history of flank pain off/on.  Came to ED yesterday with BP > 200.  CTA showed type B aortic dissection.  She is admitted to ICU for BP control.  D/w vascular surgery - conservative management favored; both kidneys are perfused on imaging.  Labs showing Cr 6, K 3.7, Bicarb 24, Hb 8.7. MCV 97.   She has had no UOP since admission to ICU.    CKD followed by [facility] -- 11/2022 last OV with APP.  Had done dialysis options class already -- interested in home therapies; lives with adult daughter.  12/2022 GFR was 11.  PMH: Past Medical History:  Diagnosis Date   Acute pancreatitis 10/23/2021   Acute renal failure superimposed on stage 4 chronic kidney disease 10/24/2021   Allergy    Anemia    Normocytic   Antibiotic-induced yeast infection 09/28/2020   Anxiety    Asthma    Blood dyscrasia    Bronchitis 2005   Bursitis of left shoulder 09/06/2019   CKD (chronic kidney disease)    CLASS 1-EXOPHTHALMOS-THYROTOXIC 02/08/2007   COVID-19 virus infection 04/28/2022   Diabetes mellitus without complication    Family history of breast cancer    Family history of lung cancer    Family history of prostate cancer    Gastroenteritis 07/10/2007   Genetic testing 07/25/2018   CustomNext + RNA Insight was ordered.  Genes Analyzed (43 total): APC*, ATM*, AXIN2, BARD1, BMPR1A, BRCA1*, BRCA2*, BRIP1*, CDH1*, CDK4, CDKN2A, CHEK2*, DICER1, GALNT12, HOXB13, MEN1, MLH1*, MRE11A, MSH2*, MSH3, MSH6*, MUTYH*, NBN, NF1*, NTHL1, PALB2*, PMS2*, POLD1, POLE, PTEN*, RAD50, RAD51C*, RAD51D*, RET, SDHB, SDHD, SMAD4, SMARCA4, STK11 and TP53* (sequencing and deletion/duplication); EGFR (s   GERD 07/24/2006    GRAVE'S DISEASE 01/01/2008   History of hidradenitis suppurativa    History of kidney stones    History of thrush    HIV DISEASE 07/24/2006   dx March 05   Hyperlipidemia    HYPERTENSION 07/24/2006   Hyperthyroidism 08/2006   Grave's Disease -diffuse radiotracer uptake 08/25/06 Thyroid scan-Cold nodule to R lower lobe of thyrorid   Menometrorrhagia    hx of   Nephrolithiasis    Panniculitis 05/12/2020   Papillary adenocarcinoma of thyroid    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 Completed 2 yrs Prednisone after lung bx confirmation. No symptoms since then.   Sebaceous cyst 04/21/2020   Suppurative hidradenitis    Thyroid cancer    THYROID NODULE, RIGHT 02/08/2007   PSH: Past Surgical History:  Procedure Laterality Date   APPLICATION OF WOUND VAC N/A 01/20/2021   Procedure: APPLICATION OF WOUND VAC;  Surgeon: Peggye Form, DO;  Location: [facility];  Service: Plastics;  Laterality: N/A;   BREAST EXCISIONAL BIOPSY Right 04/26/2018   right axilla negative  BREAST EXCISIONAL BIOPSY Left 04/26/2018   left axilla negative   BREAST LUMPECTOMY Right 10/03/2018   malignant   BREAST LUMPECTOMY WITH RADIOACTIVE SEED AND SENTINEL LYMPH NODE BIOPSY Right 10/03/2018   Procedure: RIGHT BREAST LUMPECTOMY WITH RADIOACTIVE SEED AND SENTINEL LYMPH NODE MAPPING;  Surgeon: Harriette Bouillon, MD;  Location: [facility];  Service: General;  Laterality: Right;   BREAST SURGERY  1997   Breast Reduction    CYSTOSCOPY W/ URETERAL STENT REMOVAL  11/09/2012   Procedure:  CYSTOSCOPY WITH STENT REMOVAL;  Surgeon: Sebastian Ache, MD;  Location: [facility];  Service: Urology;  Laterality: Right;   CYSTOSCOPY WITH RETROGRADE PYELOGRAM, URETEROSCOPY AND STENT PLACEMENT  11/09/2012   Procedure: CYSTOSCOPY WITH RETROGRADE PYELOGRAM, URETEROSCOPY AND STENT PLACEMENT;  Surgeon: Sebastian Ache, MD;  Location: [facility];  Service: Urology;  Laterality: Left;  LEFT URETEROSCOPY, STONE MANIPULATION, left STENT exchange    CYSTOSCOPY WITH STENT PLACEMENT  10/02/2012   Procedure: CYSTOSCOPY WITH STENT PLACEMENT;  Surgeon: Sebastian Ache, MD;  Location: [facility];  Service: Urology;  Laterality: Left;   DEBRIDEMENT AND CLOSURE WOUND N/A 01/20/2021   Procedure: Excision of abdominal wound with closure;  Surgeon: Peggye Form, DO;  Location: [facility];  Service: Plastics;  Laterality: N/A;   DILATION AND CURETTAGE OF UTERUS  11/2002   s/p for 1st trimester nonviable pregnancy   EYE SURGERY     sty under eyelid   INCISE AND DRAIN ABCESS  08/2002   s/p I &D for righ inframmary fold hidradenitis   INCISION AND DRAINAGE PERITONSILLAR ABSCESS  12/2001   IR CV LINE INJECTION  06/07/2018   IR IMAGING GUIDED PORT INSERTION  06/20/2018   IR REMOVAL TUN ACCESS W/ PORT W/O FL MOD SED  06/20/2018   IRRIGATION AND DEBRIDEMENT ABSCESS  01/31/2012   Procedure: IRRIGATION AND DEBRIDEMENT ABSCESS;  Surgeon: Kandis Cocking, MD;  Location: [facility];  Service: General;  Laterality: Right;  right breast and axilla    NEPHROLITHOTOMY  10/02/2012   Procedure: NEPHROLITHOTOMY PERCUTANEOUS;  Surgeon: Sebastian Ache, MD;  Location: [facility];  Service: Urology;  Laterality: Right;  First Stage Percutaneous Nephrolithotomy with Surgeon Access, Left Ureteral Stent     NEPHROLITHOTOMY  10/04/2012   Procedure: NEPHROLITHOTOMY PERCUTANEOUS SECOND LOOK;  Surgeon: Sebastian Ache, MD;  Location: [facility];  Service: Urology;  Laterality: Right;      NEPHROLITHOTOMY  10/08/2012   Procedure:  NEPHROLITHOTOMY PERCUTANEOUS;  Surgeon: Sebastian Ache, MD;  Location: [facility];  Service: Urology;  Laterality: Right;  THIRD STAGE, nephrostomy tube exchange x 2   NEPHROLITHOTOMY  10/11/2012   Procedure: NEPHROLITHOTOMY PERCUTANEOUS SECOND LOOK;  Surgeon: Sebastian Ache, MD;  Location: [facility];  Service: Urology;  Laterality: Right;  RIGHT 4 STAGE PERCUTANOUS NEPHROLITHOTOMY, right URETEROSCOPY WITH HOLMIUM LASER    PANNICULECTOMY N/A 12/21/2020   Procedure: PANNICULECTOMY;  Surgeon: Peggye Form, DO;  Location: [facility];  Service: Plastics;  Laterality: N/A;   PERCUTANEOUS NEPHROSTOLITHOTOMY  04/2022   PORT-A-CATH REMOVAL N/A 07/16/2020   Procedure: REMOVAL PORT-A-CATH;  Surgeon: Harriette Bouillon, MD;  Location: [facility];  Service: General;  Laterality: N/A;   PORTACATH PLACEMENT Left 05/17/2018   Procedure: INSERTION PORT-A-CATH;  Surgeon: Abigail Miyamoto, MD;  Location: [facility];  Service: General;  Laterality: Left;   RADICAL NECK DISSECTION  04/12/2017   limited/notes 04/12/2017   RADICAL NECK DISSECTION N/A 04/12/2017   Procedure: RADICAL NECK DISSECTION;  Surgeon: Christia Reading, MD;  Location: [facility]  OR;  Service: ENT;  Laterality: N/A;  limited neck dissection 2 hours total   REDUCTION MAMMAPLASTY Bilateral 1998   RIGHT/LEFT HEART CATH AND CORONARY ANGIOGRAPHY N/A 03/12/2020   Procedure: RIGHT/LEFT HEART CATH AND CORONARY ANGIOGRAPHY;  Surgeon: Dolores Patty, MD;  [facility];  Service: Cardiovascular;  Laterality: N/A;   [place]  1994   THYROIDECTOMY  04/12/2017   completion/notes 04/12/2017   THYROIDECTOMY N/A 04/12/2017   Procedure: THYROIDECTOMY;  Surgeon: Christia Reading, MD;  Location[facility];  Service: ENT;  Laterality: N/A;  Completion Thyroidectomy   TOTAL THYROIDECTOMY  2010    Past Medical History:  Diagnosis Date   Acute pancreatitis 10/23/2021   Acute renal failure superimposed on stage 4 chronic kidney disease  10/24/2021   Allergy    Anemia    Normocytic   Antibiotic-induced yeast infection 09/28/2020   Anxiety    Asthma    Blood dyscrasia    Bronchitis 2005   Bursitis of left shoulder 09/06/2019   CKD (chronic kidney disease)    CLASS 1-EXOPHTHALMOS-THYROTOXIC 02/08/2007   COVID-19 virus infection 04/28/2022   Diabetes mellitus without complication    Family history of breast cancer    Family history of lung cancer    Family history of prostate cancer    Gastroenteritis 07/10/2007   Genetic testing 07/25/2018   CustomNext + RNA Insight was ordered.  Genes Analyzed (43 total): APC*, ATM*, AXIN2, BARD1, BMPR1A, BRCA1*, BRCA2*, BRIP1*, CDH1*, CDK4, CDKN2A, CHEK2*, DICER1, GALNT12, HOXB13, MEN1, MLH1*, MRE11A, MSH2*, MSH3, MSH6*, MUTYH*, NBN, NF1*, NTHL1, PALB2*, PMS2*, POLD1, POLE, PTEN*, RAD50, RAD51C*, RAD51D*, RET, SDHB, SDHD, SMAD4, SMARCA4, STK11 and TP53* (sequencing and deletion/duplication); EGFR (s   GERD 07/24/2006   GRAVE'S DISEASE 01/01/2008   History of hidradenitis suppurativa    History of kidney stones    History of thrush    HIV DISEASE 07/24/2006   dx March 05   Hyperlipidemia    HYPERTENSION 07/24/2006   Hyperthyroidism 08/2006   Grave's Disease -diffuse radiotracer uptake 08/25/06 Thyroid scan-Cold nodule to R lower lobe of thyrorid   Menometrorrhagia    hx of   Nephrolithiasis    Panniculitis 05/12/2020   Papillary adenocarcinoma of thyroid    METASTATIC PAPILLARY THYROID CARCINOMA per 01/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 Completed 2 yrs Prednisone after lung bx confirmation. No symptoms  since then.   Sebaceous cyst 04/21/2020   Suppurative hidradenitis    Thyroid cancer    THYROID NODULE, RIGHT 02/08/2007    Medications:  I have reviewed the patient's current medications.  Medications Prior to Admission  Medication Sig Dispense Refill   acetaminophen (TYLENOL) 500 MG tablet Take 1,000 mg by mouth every 6 (six) hours as needed for mild pain.     albuterol (VENTOLIN HFA) 108 (90 Base) MCG/ACT inhaler Inhale 2 puffs into the lungs every 6 (six) hours as needed for wheezing or shortness of breath. 8 g 2   allopurinol (ZYLOPRIM) 100 MG tablet TAKE 1 TABLET BY MOUTH EVERY DAY FOR GOUT PREVENTION (Patient taking differently: Take 100 mg by mouth daily. For gout prevention)  90 tablet 0   candesartan (ATACAND) 32 MG tablet TAKE 1 TABLET BY MOUTH DAILY. 30 tablet 5   carvedilol (COREG) 25 MG tablet Take 1 tablet (25 mg total) by mouth 2 (two) times daily. 180 tablet 3   cetirizine (ZYRTEC) 10 MG tablet TAKE 1 TABLET BY MOUTH AT BEDTIME FOR ALLERGIES (Patient taking differently: Take 10 mg by mouth at bedtime.) 90 tablet 0   dolutegravir ([name]) 50 MG tablet TAKE 1 TABLET BY MOUTH EVERY DAY 30 tablet 0   emtricitabine-tenofovir AF (DESCOVY) 200-25 MG tablet TAKE 1 TABLET BY MOUTH EVERY DAY 30 tablet 0   FLOVENT HFA 110 MCG/ACT inhaler TAKE 1 PUFF BY MOUTH TWICE A DAY (Patient taking differently: Inhale 1 puff into the lungs 2 (two) times daily as needed (wheezing/shortness of breath).) 36 Inhaler 1   fluticasone (FLONASE) 50 MCG/ACT nasal spray Place 2 sprays into both nostrils daily. 16 g 6   gabapentin (NEURONTIN) 300 MG capsule Take 300 mg by mouth daily.     glipiZIDE (GLUCOTROL) 10 MG tablet TAKE 1 TABLET BY MOUTH TWICE A DAY FOR DIABETES (Patient taking differently: Take 10 mg by mouth 2 (two) times daily before a meal.) 180 tablet 0   hydrALAZINE (APRESOLINE) 50 MG tablet TAKE 1 TABLET (50 MG) BY MOUTH IN THE MORNING AND AT BEDTIME (Patient taking differently: Take 50 mg by  mouth in the morning and at bedtime.) 60 tablet 3   levothyroxine (SYNTHROID) 175 MCG tablet TAKE 1 TABLET BY MOUTH EVERY DAY 30 tablet 7   minocycline (DYNACIN) 100 MG tablet Take 1 tablet (100 mg total) by mouth 2 (two) times daily. 60 tablet 11   omeprazole (PRILOSEC) 40 MG capsule Take 1 capsule (40 mg total) by mouth 2 (two) times daily before a meal. 180 capsule 0   rosuvastatin (CRESTOR) 10 MG tablet TAKE 1 TABLET BY MOUTH EVERY DAY FOR CHOLESTEROL (Patient taking differently: Take 10 mg by mouth daily. FOR CHOLESTEROL) 90 tablet 2   sertraline (ZOLOFT) 50 MG tablet TAKE 1 TABLET (50 MG TOTAL) BY MOUTH DAILY. FOR ANXIETY AND DEPRESSION. 90 tablet 3   tamoxifen (NOLVADEX) 20 MG tablet Take 1 tablet (20 mg total) by mouth daily. 90 tablet 4   Blood Glucose Monitoring Suppl (FREESTYLE LITE) w/Device KIT Inject 1 each into the skin daily. 1 kit 0   glucose blood (ONETOUCH VERIO) test strip USE AS INSTRUCTED TO TEST BLOOD SUGAR up to 3 times daily. 300 each 3   Lancets (ONETOUCH ULTRASOFT) lancets Use as instructed to test blood sugar daily 100 each 5    ALLERGIES:   Allergies  Allergen Reactions   Genvoya [Elviteg-Cobic-Emtricit-Tenofaf] Hives   Lisinopril Cough   Tape Rash    Rash  Okay with tegaderm and paper tape   Aldactone [Spironolactone] Hives   Tegaderm Ag Mesh [Silver] Itching    FAM HX: Family History  Problem Relation Age of Onset   Hypertension Mother    Cancer Mother        laryngeal   Heart disease Mother        stent   Pancreatic cancer Father    Hypertension Father    Lung cancer Father 43       hx smoking   Hypertension Sister    Hypertension Sister    Hypertension Brother    Breast cancer Maternal Aunt 17   Breast cancer Maternal Aunt        dx 60+   Cancer Maternal Uncle  Lung CA   Breast cancer Paternal Aunt 36   Breast cancer Paternal Aunt        dx 40's   Breast cancer Paternal Aunt        dx 50's   Prostate cancer Paternal Uncle     Prostate cancer Paternal Uncle    Lung cancer Paternal Uncle    Breast cancer Cousin 7   Breast cancer Cousin        dx <50   Breast cancer Cousin        dx <50   Breast cancer Cousin        dx <50   Heart disease Other    Hypertension Other    Stroke Other        Grandparent   Kidney disease Other        Grandparent   Diabetes Other        FH of Diabetes   Colon cancer Neg Hx    Esophageal cancer Neg Hx    Rectal cancer Neg Hx    Stomach cancer Neg Hx     Social History:   reports that she quit smoking about 5 years ago. Her smoking use included cigarettes. She started smoking about 5 years ago. She has a 7.50 pack-year smoking history. She has never used smokeless tobacco. She reports current alcohol use. She reports that she does not use drugs.  ROS: 12 system ROS neg except per HPI  Blood pressure 126/65, pulse 63, temperature 97.7 F (36.5 C), temperature source Axillary, resp. rate 16, height 5\' 5"  (1.651 m), weight 96.2 kg, last menstrual period 03/31/2014, SpO2 99 %. PHYSICAL EXAM: Gen: sleeping comfortably, awakens but drowsy Eyes: anicteric ENT: MMM Neck: supple, no JVD CV:  RRR, no rub Abd:  soft nontender Back: clear GU: no foley Extr:  no edema Neuro: no asterixis   Results for orders placed or performed during the hospital encounter of 01/29/23 (from the past 48 hour(s))  Lipase, blood     Status: None   Collection Time: 01/28/23 11:58 PM  Result Value Ref Range   Lipase 51 11 - 51 U/L    Comment: Performed at [facility], [address], [place], [place] [address]  Comprehensive metabolic panel     Status: Abnormal   Collection Time: 01/28/23 11:58 PM  Result Value Ref Range   Sodium 139 135 - 145 mmol/L   Potassium 3.7 3.5 - 5.1 mmol/L   Chloride 102 98 - 111 mmol/L   CO2 24 22 - 32 mmol/L   Glucose, Bld 156 (H) 70 - 99 mg/dL    Comment: Glucose reference range applies only to samples taken after fasting for at least 8  hours.   BUN 54 (H) 6 - 20 mg/dL   Creatinine, Ser 9.14 (H) 0.44 - 1.00 mg/dL   Calcium 9.3 8.9 - 78.2 mg/dL   Total Protein 8.1 6.5 - 8.1 g/dL   Albumin 3.7 3.5 - 5.0 g/dL   AST 17 15 - 41 U/L   ALT 12 0 - 44 U/L   Alkaline Phosphatase 97 38 - 126 U/L   Total Bilirubin 0.3 0.3 - 1.2 mg/dL   GFR, Estimated 8 (L) >60 mL/min    Comment: (NOTE) Calculated using the CKD-EPI Creatinine Equation (2021)    Anion gap 13 5 - 15    Comment: Performed at [facility], [address] [place], [place] [address]  CBC     Status: Abnormal  Collection Time: 01/28/23 11:58 PM  Result Value Ref Range   WBC 14.2 (H) 4.0 - 10.5 K/uL   RBC 2.65 (L) 3.87 - 5.11 MIL/uL   Hemoglobin 8.7 (L) 12.0 - 15.0 g/dL   HCT 40.9 (L) 81.1 - 91.4 %   MCV 97.0 80.0 - 100.0 fL   MCH 32.8 26.0 - 34.0 pg   MCHC 33.9 30.0 - 36.0 g/dL   RDW 78.2 (H) 95.6 - 21.3 %   Platelets 180 150 - 400 K/uL   nRBC 0.0 0.0 - 0.2 %    Comment: Performed at [facility], [address], [place], [place] [address]  MRSA Next Gen by PCR, Nasal     Status: None   Collection Time: 01/29/23  5:39 AM   Specimen: Nasal Mucosa; Nasal Swab  Result Value Ref Range   MRSA by PCR Next Gen NOT DETECTED NOT DETECTED    Comment: (NOTE) The GeneXpert MRSA Assay (FDA approved for NASAL specimens only), is one component of a comprehensive MRSA colonization surveillance program. It is not intended to diagnose MRSA infection nor to guide or monitor treatment for MRSA infections. Test performance is not FDA approved in patients less than 14 years old. Performed at [facility], [address], [place], [place] [address]   Glucose, capillary     Status: Abnormal   Collection Time: 01/29/23  5:47 AM  Result Value Ref Range   Glucose-Capillary 173 (H) 70 - 99 mg/dL    Comment: Glucose reference range applies only to samples taken after fasting for at least 8 hours.  Type and screen  [facility]     Status: None   Collection Time: 01/29/23  5:48 AM  Result Value Ref Range   ABO/RH(D) O POS    Antibody Screen NEG    Sample Expiration      02/01/2023,2359 Performed at [facility], [address], [place], [place] [address]   Hemoglobin A1c     Status: Abnormal   Collection Time: 01/29/23  5:50 AM  Result Value Ref Range   Hgb A1c MFr Bld 6.0 (H) 4.8 - 5.6 %    Comment: (NOTE) Pre diabetes:          5.7%-6.4%  Diabetes:              >6.4%  Glycemic control for   <7.0% adults with diabetes    Mean Plasma Glucose 125.5 mg/dL    Comment: Performed at [facility], [address], [place], [place] [address]  Glucose, capillary     Status: Abnormal   Collection Time: 01/29/23  8:06 AM  Result Value Ref Range   Glucose-Capillary 129 (H) 70 - 99 mg/dL    Comment: Glucose reference range applies only to samples taken after fasting for at least 8 hours.  Glucose, capillary     Status: Abnormal   Collection Time: 01/29/23 11:31 AM  Result Value Ref Range   Glucose-Capillary 124 (H) 70 - 99 mg/dL    Comment: Glucose reference range applies only to samples taken after fasting for at least 8 hours.   *Note: Due to a large number of results and/or encounters for the requested time period, some results have not been displayed. A complete set of results can be found in Results Review.    CT Angio Chest/Abd/Pel for Dissection W and/or Wo Contrast  Result Date: 01/29/2023 CLINICAL DATA:  Abdominal pain and back pain, rule out dissection  EXAM: CT ANGIOGRAPHY CHEST, ABDOMEN AND PELVIS TECHNIQUE: CT renal stone protocol 01/29/2023 and CT abdomen and pelvis 08/31/2022 Multidetector CT imaging through the chest, abdomen and pelvis was performed using the standard protocol during bolus administration of intravenous contrast. Multiplanar reconstructed images and MIPs were obtained and reviewed to evaluate the vascular anatomy. RADIATION DOSE REDUCTION: This exam was  performed according to the departmental dose-optimization program which includes automated exposure control, adjustment of the mA and/or kV according to patient size and/or use of iterative reconstruction technique. CONTRAST:  OMNIPAQUE IOHEXOL 350 MG/ML SOLN COMPARISON:  None Available. 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 There is a windsock sign in the mid thoracic descending aorta (circa series 5/image 81) compatible with intimointimal intussusception. The dissection flap extends along the right aortic sidewall into the abdominal aorta. The lower thoracic descending aorta near the hiatus measures 44 mm in diameter and is similar in size to CT 01/29/2023 at 1:04 a.m. given differences in measuring technique. Mild wall thickening of the wall of the aortic arch. Small pericardial effusion. Cardiomegaly. The main pulmonary artery is mildly dilated measuring 35 mm in diameter. Mediastinum/Nodes: Unremarkable esophagus. Prominent mediastinal lymph nodes. For example a right paratracheal node measures 1.5 cm in short axis (5/51). These are similar to slightly increased from 09/27/2018 and favored reactive. Normal thyroid. Lungs/Pleura: Interlobular septal thickening greatest in the lower lungs with patchy ground-glass opacities peripherally. Atelectasis or consolidation in the left lower lobe. Musculoskeletal: No acute abnormality. Review of the MIP images confirms the above findings. CTA ABDOMEN AND PELVIS FINDINGS VASCULAR Aorta: The type B descending thoracic aortic dissection continues into the abdomen extending along the right lateral wall of the aorta likely into the right common iliac artery, external iliac artery, and into the common femoral artery with termination at the bifurcation into the superficial femoral and profunda femoral artery. Celiac: The dissection flap abuts  the celiac axis which is patent and arises from the true lumen. SMA: The dissection flap appears to cover the origin of the SMA which appears to arise from the false lumen. The SMA is well opacified. Renals: The left renal artery is patent and arises from the true lumen. The right renal artery arises from the false lumen and is patent and opacified. IMA: The IMA is patent and arises from the true lumen. Inflow: The dissection flap continues through the left common iliac, external iliac, and common femoral artery terminating at the bifurcation into the superficial and profunda femoral arteries. The left internal iliac artery is not opacified likely occluded. , Definitive the right common iliac artery appears to arise from the true lumen. The right common iliac, external iliac, and internal iliac arteries are patent. Common femoral artery is patent on the right. Veins: No obvious venous abnormality within the limitations of this arterial phase study. Review of the MIP images confirms the above findings. NON-VASCULAR Hepatobiliary: No focal liver abnormality is seen. No gallstones, gallbladder wall thickening, or biliary dilatation. Pancreas: Unremarkable. No pancreatic ductal dilatation or surrounding inflammatory changes. Spleen: Normal in size without focal abnormality. Adrenals/Urinary Tract: Stable adrenal glands. Cortical renal scarring right kidney. Nonobstructing right renal calculi. No hydronephrosis. Unremarkable bladder. Stomach/Bowel: Normal caliber large and small bowel. No bowel wall thickening to suggest ischemia. Stomach is within normal limits. Lymphatic: No lymphadenopathy. Reproductive: Uterus and bilateral adnexa are unremarkable. Other: No free intraperitoneal fluid or air. Musculoskeletal: No acute  osseous abnormality. Review of the MIP images confirms the above findings. IMPRESSION: 1. Type B acute aortic dissection arising in the proximal descending thoracic aorta distal to the origin of the  left subclavian artery. There is a windsock sign in the mid descending thoracic aorta compatible with intimointimal intussusception. The dissection flap extends into the abdominal aorta, right common iliac artery, right external iliac artery, and left common femoral artery terminating at the bifurcation into the superficial and profunda femoral arteries. The left internal iliac artery is not opacified likely occluded. The celiac axis, left renal artery, and IMA arise from the true lumen. The right renal artery and probably the SMA arise from the false lumen. 2. Mild wall thickening of the aortic arch. 3. Cardiomegaly with small pericardial effusion. 4. Mildly dilated main pulmonary artery which can be seen in the setting of pulmonary hypertension. 5. Interlobular septal thickening and patchy ground-glass opacities greatest in the lower lungs concerning for pulmonary edema. Trace bilateral pleural effusions. 6. Nonobstructing right renal calculi. Critical Value/emergent results were called by telephone at the time of interpretation on 01/29/2023 at 2:39 am to provider [facility] , who verbally acknowledged these results. Electronically Signed   By: Minerva Fester M.D.   On: 01/29/2023 02:39   CT Renal Stone Study  Result Date: 01/29/2023 CLINICAL DATA:  lower back pain that started yesterday and multiple episodes of emesis today. Pt also endorses increases SOB. HX of similar pain back in September, unknown etiology. Hx kidney stones. EXAM: CT ABDOMEN AND PELVIS WITHOUT CONTRAST TECHNIQUE: Multidetector CT imaging of the abdomen and pelvis was performed following the standard protocol without IV contrast. RADIATION DOSE REDUCTION: This exam was performed according to the departmental dose-optimization program which includes automated exposure control, adjustment of the mA and/or kV according to patient size and/or use of iterative reconstruction technique. COMPARISON:  CT abdomen pelvis 10/31/2021 FINDINGS:  Lower chest: Bilateral trace pleural effusions. Passive atelectasis. Cardiac findings suggestive of anemia. 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 Hepatobiliary: No focal liver abnormality. No gallstones, gallbladder wall thickening, or pericholecystic fluid. No biliary dilatation. Pancreas: No focal lesion. Normal pancreatic contour. No surrounding inflammatory changes. No main pancreatic ductal dilatation. Spleen: Normal in size without focal abnormality. Adrenals/Urinary Tract: No adrenal nodule bilaterally. Right nephrolithiasis measuring up to 7 mm. No left nephrolithiasis. No ureterolithiasis. The urinary bladder is unremarkable. Stomach/Bowel: Stomach is within normal limits. No evidence of bowel wall thickening or dilatation. Colonic diverticulosis. Appendix appears normal. Vascular/Lymphatic: Interval development of dissection of the infrarenal abdominal aorta (2:39) that extends to at least the left common iliac artery (2:50). No abdominal aorta or iliac aneurysm. Question slight periaortic fat stranding (2:42). Mild atherosclerotic plaque of the aorta and its branches. No abdominal, pelvic, or inguinal lymphadenopathy. Reproductive: Uterus and bilateral adnexa are unremarkable. Other: No intraperitoneal free fluid. No intraperitoneal free gas. No organized fluid collection. Musculoskeletal: No abdominal wall hernia or abnormality. No suspicious lytic or blastic osseous lesions. No acute displaced fracture. Multilevel degenerative changes of the spine. IMPRESSION: 1. Findings suggestive of interval development of dissection of the infrarenal abdominal aorta likely extending to the left common iliac artery. 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 Recommend emergent CTA of the chest, abdomen, pelvis (OK to do even though Creatinine of 6 as benefits > risks). 2.  Nonobstructive right nephrolithiasis measuring up to 7  mm. 3. Colonic diverticulosis with no acute diverticulitis. 4. Bilateral trace pleural effusions. These results were called by telephone at the time of interpretation on 01/29/2023 at 1:36 am to provider [facility] , who verbally acknowledged these results. Electronically Signed   By: Tish Frederickson M.D.   On: 01/29/2023 01:40    Assessment/PlanAngela Cornelia Parco is an 57 y.o. female with CKD 4/5, HIV, HTN, DM, sarcoidosis currently admitted for type B aortic dissection and nephrology is consulted for AKI on CKD.    **type B aortic dissection: d/w vascular surgery.  Plans for conservative management at this time.   **HTN: PCCM treating intensively with dissection  **AKI on CKD:  baseline CKD 5 recently, now with anuric AKI in setting of contrast administration.  Per vascular surgery both kidneys are perfusing - no need for invasive intervention there.  No indications for dialysis currently but very high risk for needing in the next 24-48h.  Bladder scan now given anuric.  Maintain euvolemia - looks ok currently.  BP goals guided by dissection therapy.  Avoid nephrotoxins, follow I/Os, daily labs.   **HIV: on meds  **DM: per primary  Will follow, please reach out with concerns.   Tyler Pita 01/29/2023, 11:43 AM

## 2023-01-29 NOTE — ED Notes (Signed)
Pt's daughter has all belongings. Clothes, shoes, 2 bracelets, 1 necklace, purse, 3 earrings, and charger for phone.

## 2023-01-29 NOTE — ED Notes (Signed)
Pt resting sisters at bedside, states that she is in no pain.

## 2023-01-29 NOTE — Progress Notes (Signed)
Patient seen bedside.  Resting comfortably.  Back, abdominal, chest pain.  BP still elevated Patient would benefit from arterial line in the right wrist Plan to continue medical management   Victorino Sparrow MD

## 2023-01-29 NOTE — ED Notes (Signed)
Pt started having pain in the lower back, gave her the dilaudid and zofran that was ordered. Vs continue to come down leaving drips at current rate.

## 2023-01-29 NOTE — ED Notes (Signed)
Pt provided more pain medication. Call light in reach.

## 2023-01-30 ENCOUNTER — Inpatient Hospital Stay (HOSPITAL_COMMUNITY): Payer: Medicare Other

## 2023-01-30 DIAGNOSIS — N184 Chronic kidney disease, stage 4 (severe): Secondary | ICD-10-CM

## 2023-01-30 DIAGNOSIS — I7102 Dissection of abdominal aorta: Secondary | ICD-10-CM | POA: Diagnosis not present

## 2023-01-30 DIAGNOSIS — N179 Acute kidney failure, unspecified: Secondary | ICD-10-CM | POA: Diagnosis not present

## 2023-01-30 DIAGNOSIS — I161 Hypertensive emergency: Secondary | ICD-10-CM

## 2023-01-30 DIAGNOSIS — G9341 Metabolic encephalopathy: Secondary | ICD-10-CM | POA: Diagnosis not present

## 2023-01-30 DIAGNOSIS — I7103 Dissection of thoracoabdominal aorta: Secondary | ICD-10-CM | POA: Diagnosis not present

## 2023-01-30 HISTORY — PX: IR US GUIDE VASC ACCESS RIGHT: IMG2390

## 2023-01-30 HISTORY — PX: IR FLUORO GUIDE CV LINE RIGHT: IMG2283

## 2023-01-30 LAB — ECHOCARDIOGRAM COMPLETE
AR max vel: 2.46 cm2
AV Area VTI: 2.36 cm2
AV Area mean vel: 2.37 cm2
AV Mean grad: 6.5 mmHg
AV Peak grad: 11.9 mmHg
Ao pk vel: 1.73 m/s
Area-P 1/2: 2.62 cm2
S' Lateral: 3.5 cm
Weight: 3541.47 oz

## 2023-01-30 LAB — CBC
HCT: 22.1 % — ABNORMAL LOW (ref 36.0–46.0)
Hemoglobin: 7.2 g/dL — ABNORMAL LOW (ref 12.0–15.0)
MCH: 33.2 pg (ref 26.0–34.0)
MCHC: 32.6 g/dL (ref 30.0–36.0)
MCV: 101.8 fL — ABNORMAL HIGH (ref 80.0–100.0)
Platelets: 175 10*3/uL (ref 150–400)
RBC: 2.17 MIL/uL — ABNORMAL LOW (ref 3.87–5.11)
RDW: 16.9 % — ABNORMAL HIGH (ref 11.5–15.5)
WBC: 12.6 10*3/uL — ABNORMAL HIGH (ref 4.0–10.5)
nRBC: 0 % (ref 0.0–0.2)

## 2023-01-30 LAB — BASIC METABOLIC PANEL
Anion gap: 14 (ref 5–15)
BUN: 60 mg/dL — ABNORMAL HIGH (ref 6–20)
CO2: 20 mmol/L — ABNORMAL LOW (ref 22–32)
Calcium: 7.8 mg/dL — ABNORMAL LOW (ref 8.9–10.3)
Chloride: 98 mmol/L (ref 98–111)
Creatinine, Ser: 7.44 mg/dL — ABNORMAL HIGH (ref 0.44–1.00)
GFR, Estimated: 6 mL/min — ABNORMAL LOW (ref >60)
Glucose, Bld: 120 mg/dL — ABNORMAL HIGH (ref 70–99)
Potassium: 4.4 mmol/L (ref 3.5–5.1)
Sodium: 132 mmol/L — ABNORMAL LOW (ref 135–145)

## 2023-01-30 LAB — GLUCOSE, CAPILLARY
Glucose-Capillary: 115 mg/dL — ABNORMAL HIGH (ref 70–99)
Glucose-Capillary: 117 mg/dL — ABNORMAL HIGH (ref 70–99)
Glucose-Capillary: 128 mg/dL — ABNORMAL HIGH (ref 70–99)
Glucose-Capillary: 138 mg/dL — ABNORMAL HIGH (ref 70–99)
Glucose-Capillary: 143 mg/dL — ABNORMAL HIGH (ref 70–99)
Glucose-Capillary: 147 mg/dL — ABNORMAL HIGH (ref 70–99)
Glucose-Capillary: 149 mg/dL — ABNORMAL HIGH (ref 70–99)

## 2023-01-30 LAB — HEPATITIS B SURFACE ANTIGEN: Hepatitis B Surface Ag: NONREACTIVE

## 2023-01-30 MED ORDER — HEPARIN SODIUM (PORCINE) 1000 UNIT/ML IJ SOLN
INTRAMUSCULAR | Status: AC
  Filled 2023-01-30: qty 10

## 2023-01-30 MED ORDER — MIDAZOLAM HCL 2 MG/2ML IJ SOLN
INTRAMUSCULAR | Status: AC | PRN
  Administered 2023-01-30 (×2): .5 mg via INTRAVENOUS

## 2023-01-30 MED ORDER — FENTANYL CITRATE (PF) 100 MCG/2ML IJ SOLN
INTRAMUSCULAR | Status: AC | PRN
  Administered 2023-01-30: 25 ug via INTRAVENOUS

## 2023-01-30 MED ORDER — FENTANYL CITRATE (PF) 100 MCG/2ML IJ SOLN
INTRAMUSCULAR | Status: AC
  Filled 2023-01-30: qty 2

## 2023-01-30 MED ORDER — CEFAZOLIN SODIUM-DEXTROSE 2-4 GM/100ML-% IV SOLN
INTRAVENOUS | Status: AC
  Filled 2023-01-30: qty 100

## 2023-01-30 MED ORDER — CHLORHEXIDINE GLUCONATE CLOTH 2 % EX PADS
6.0000 | MEDICATED_PAD | Freq: Every day | CUTANEOUS | Status: DC
  Administered 2023-02-01 – 2023-02-07 (×7): 6 via TOPICAL

## 2023-01-30 MED ORDER — CEFAZOLIN SODIUM-DEXTROSE 2-4 GM/100ML-% IV SOLN
INTRAVENOUS | Status: AC | PRN
  Administered 2023-01-30: 2 g via INTRAVENOUS

## 2023-01-30 MED ORDER — HYDROMORPHONE HCL 1 MG/ML IJ SOLN
0.5000 mg | INTRAMUSCULAR | Status: DC | PRN
  Administered 2023-01-30 – 2023-02-05 (×8): 0.5 mg via INTRAVENOUS
  Filled 2023-01-30 (×8): qty 0.5

## 2023-01-30 MED ORDER — HYDRALAZINE HCL 25 MG PO TABS: 25.0000 mg | ORAL_TABLET | Freq: Three times a day (TID) | ORAL | Status: DC

## 2023-01-30 MED ORDER — LIDOCAINE-EPINEPHRINE 1 %-1:100000 IJ SOLN
INTRAMUSCULAR | Status: AC
  Filled 2023-01-30: qty 1

## 2023-01-30 MED ORDER — HYDRALAZINE HCL 25 MG PO TABS
25.0000 mg | ORAL_TABLET | Freq: Three times a day (TID) | ORAL | Status: DC
  Administered 2023-01-30 – 2023-01-31 (×3): 25 mg via ORAL
  Filled 2023-01-30 (×3): qty 1

## 2023-01-30 MED ORDER — GELATIN ABSORBABLE 12-7 MM EX MISC
CUTANEOUS | Status: AC
  Filled 2023-01-30: qty 1

## 2023-01-30 MED ORDER — MIDAZOLAM HCL 2 MG/2ML IJ SOLN
INTRAMUSCULAR | Status: AC
  Filled 2023-01-30: qty 2

## 2023-01-30 NOTE — Consult Note (Signed)
Chief Complaint: Patient was seen in consultation today for  Chief Complaint  Patient presents with   Back Pain   Shortness of Breath   Emesis    Referring Physician(s): Dr. Anthony Sar   Supervising Physician: Richarda Overlie  Patient Status: [facility] - In-pt  History of Present Illness: [name] is a 57 y.o. 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 She presented to the [facility] ED 01/29/23 with complaints of back pain, abdominal pain, shortness of breath and vomiting. Blood pressure on arrival was 264/105 and work up was significant for a Type B aortic dissection. She was also found to have AKI on CKD. Soon after admission she developed uremic symptoms and she is now considered to have end stage renal disease. The nephrology team is preparing her for hemodialysis.    Interventional Radiology has been asked to evaluate this patient for an image-guided tunneled dialysis catheter placement.   Past Medical History:  Diagnosis Date   Acute pancreatitis 10/23/2021   Acute renal failure superimposed on stage 4 chronic kidney disease 10/24/2021   Allergy    Anemia    Normocytic   Antibiotic-induced yeast infection 09/28/2020   Anxiety    Asthma    Blood dyscrasia    Bronchitis 2005   Bursitis of left shoulder 09/06/2019   CKD (chronic kidney disease)    CLASS 1-EXOPHTHALMOS-THYROTOXIC 02/08/2007   COVID-19 virus infection 04/28/2022   Diabetes mellitus without complication    Family history of breast cancer    Family history of lung cancer    Family history of prostate cancer    Gastroenteritis 07/10/2007   Genetic testing 07/25/2018   [employer] + RNA Insight was ordered.  Genes Analyzed (43 total): APC*, ATM*, AXIN2, BARD1, BMPR1A, BRCA1*, BRCA2*, BRIP1*, CDH1*, CDK4, CDKN2A, CHEK2*, DICER1, GALNT12, HOXB13, MEN1, MLH1*, MRE11A, MSH2*, MSH3, MSH6*,  MUTYH*, NBN, NF1*, NTHL1, PALB2*, PMS2*, POLD1, POLE, PTEN*, RAD50, RAD51C*, RAD51D*, RET, SDHB, SDHD, SMAD4, SMARCA4, STK11 and TP53* (sequencing and deletion/duplication); EGFR (s   GERD 07/24/2006   GRAVE'S DISEASE 01/01/2008   History of hidradenitis suppurativa    History of kidney stones    History of thrush    HIV DISEASE 07/24/2006   dx March 05   Hyperlipidemia    HYPERTENSION 07/24/2006   Hyperthyroidism 08/2006   Grave's Disease -diffuse radiotracer uptake 08/25/06 Thyroid scan-Cold nodule to R lower lobe of thyrorid   Menometrorrhagia    hx of   Nephrolithiasis    Panniculitis 05/12/2020   Papillary adenocarcinoma of thyroid    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 Completed 2 yrs Prednisone after lung bx confirmation. No symptoms since then.   Sebaceous cyst 04/21/2020   Suppurative hidradenitis    Thyroid cancer    THYROID NODULE, RIGHT 02/08/2007    Past Surgical History:  Procedure Laterality Date   APPLICATION OF WOUND VAC N/A 01/20/2021   Procedure: APPLICATION OF WOUND VAC;  Surgeon: Peggye Form, DO;  Location: [facility];  Service: Government social research officer;  Laterality: N/A;   BREAST EXCISIONAL BIOPSY Right 04/26/2018   right axilla negative   BREAST EXCISIONAL BIOPSY Left 04/26/2018   left axilla negative   BREAST LUMPECTOMY Right 10/03/2018   malignant   BREAST LUMPECTOMY WITH RADIOACTIVE SEED AND SENTINEL LYMPH NODE BIOPSY Right 10/03/2018   Procedure: RIGHT BREAST LUMPECTOMY WITH RADIOACTIVE SEED AND SENTINEL LYMPH NODE MAPPING;   Surgeon: Harriette Bouillon, MD;  Location: [facility];  Service: General;  Laterality: Right;   BREAST SURGERY  1997   Breast Reduction    CYSTOSCOPY W/ URETERAL STENT REMOVAL  11/09/2012   Procedure: CYSTOSCOPY WITH STENT REMOVAL;  Surgeon: Sebastian Ache, MD;  Location: [facility];  Service: Urology;  Laterality: Right;   CYSTOSCOPY WITH RETROGRADE PYELOGRAM, URETEROSCOPY AND STENT PLACEMENT  11/09/2012   Procedure: CYSTOSCOPY WITH RETROGRADE PYELOGRAM, URETEROSCOPY AND STENT PLACEMENT;  Surgeon: Sebastian Ache, MD;  Location: [facility];  Service: Urology;  Laterality: Left;  LEFT URETEROSCOPY, STONE MANIPULATION, left STENT exchange    CYSTOSCOPY WITH STENT PLACEMENT  10/02/2012   Procedure: CYSTOSCOPY WITH STENT PLACEMENT;  Surgeon: Sebastian Ache, MD;  Location: [facility];  Service: Urology;  Laterality: Left;   DEBRIDEMENT AND CLOSURE WOUND N/A 01/20/2021   Procedure: Excision of abdominal wound with closure;  Surgeon: Peggye Form, DO;  Location: [facility];  Service: Plastics;  Laterality: N/A;   DILATION AND CURETTAGE OF UTERUS  11/2002   s/p for 1st trimester nonviable pregnancy   EYE SURGERY     sty under eyelid   INCISE AND DRAIN ABCESS  08/2002   s/p I &D for righ inframmary fold hidradenitis   INCISION AND DRAINAGE PERITONSILLAR ABSCESS  12/2001   IR CV LINE INJECTION  06/07/2018   IR IMAGING GUIDED PORT INSERTION  06/20/2018   IR REMOVAL TUN ACCESS W/ PORT W/O FL MOD SED  06/20/2018   IRRIGATION AND DEBRIDEMENT ABSCESS  01/31/2012   Procedure: IRRIGATION AND DEBRIDEMENT ABSCESS;  Surgeon: Kandis Cocking, MD;  Location: [facility];  Service: General;  Laterality: Right;  right breast and axilla    NEPHROLITHOTOMY  10/02/2012   Procedure: NEPHROLITHOTOMY PERCUTANEOUS;  Surgeon: Sebastian Ache, MD;  Location: [facility];  Service: Urology;  Laterality: Right;  First Stage Percutaneous Nephrolithotomy with Surgeon Access, Left Ureteral Stent     NEPHROLITHOTOMY  10/04/2012    Procedure: NEPHROLITHOTOMY PERCUTANEOUS SECOND LOOK;  Surgeon: Sebastian Ache, MD;  Location: [facility];  Service: Urology;  Laterality: Right;      NEPHROLITHOTOMY  10/08/2012   Procedure: NEPHROLITHOTOMY PERCUTANEOUS;  Surgeon: Sebastian Ache, MD;  Location: [facility];  Service: Urology;  Laterality: Right;  THIRD STAGE, nephrostomy tube exchange x 2   NEPHROLITHOTOMY  10/11/2012   Procedure: NEPHROLITHOTOMY PERCUTANEOUS SECOND LOOK;  Surgeon: Sebastian Ache, MD;  Location: [facility];  Service: Urology;  Laterality: Right;  RIGHT 4 STAGE PERCUTANOUS NEPHROLITHOTOMY, right URETEROSCOPY WITH HOLMIUM LASER    PANNICULECTOMY N/A 12/21/2020   Procedure: PANNICULECTOMY;  Surgeon: Peggye Form, DO;  Location: [facility];  Service: Plastics;  Laterality: N/A;   PERCUTANEOUS NEPHROSTOLITHOTOMY  04/2022   PORT-A-CATH REMOVAL N/A 07/16/2020   Procedure: REMOVAL PORT-A-CATH;  Surgeon: Harriette Bouillon, MD;  Location: [facility];  Service: General;  Laterality: N/A;   PORTACATH PLACEMENT Left 05/17/2018   Procedure: INSERTION PORT-A-CATH;  Surgeon: Abigail Miyamoto, MD;  Location: [facility];  Service: General;  Laterality: Left;   RADICAL NECK DISSECTION  04/12/2017   limited/notes 04/12/2017  RADICAL NECK DISSECTION N/A 04/12/2017   Procedure: RADICAL NECK DISSECTION;  Surgeon: Christia Reading, MD;  Location: [facility] OR;  Service: ENT;  Laterality: N/A;  limited neck dissection 2 hours total   REDUCTION MAMMAPLASTY Bilateral 1998   RIGHT/LEFT HEART CATH AND CORONARY ANGIOGRAPHY N/A 03/12/2020   Procedure: RIGHT/LEFT HEART CATH AND CORONARY ANGIOGRAPHY;  Surgeon: Dolores Patty, MD;  [facility]: [facility];  Service: Cardiovascular;  Laterality: N/A;   [place]  1994   THYROIDECTOMY  04/12/2017   completion/notes 04/12/2017   THYROIDECTOMY N/A 04/12/2017   Procedure: THYROIDECTOMY;  Surgeon: Christia Reading, MD;  Location: [facility] OR;  Service: ENT;  Laterality: N/A;  Completion  Thyroidectomy   TOTAL THYROIDECTOMY  2010    Allergies: Genvoya [elviteg-cobic-emtricit-tenofaf], Lisinopril, Tape, Aldactone [spironolactone], and Tegaderm ag mesh [silver]  Medications: Prior to Admission medications   Medication Sig Start Date End Date Taking? Authorizing Provider  acetaminophen (TYLENOL) 500 MG tablet Take 1,000 mg by mouth every 6 (six) hours as needed for mild pain.   Yes [provider]  albuterol (VENTOLIN HFA) 108 (90 Base) MCG/ACT inhaler Inhale 2 puffs into the lungs every 6 (six) hours as needed for wheezing or shortness of breath. 10/07/20  Yes Hall-Potvin, [place], PA-C  allopurinol (ZYLOPRIM) 100 MG tablet TAKE 1 TABLET BY MOUTH EVERY DAY FOR GOUT PREVENTION Patient taking differently: Take 100 mg by mouth daily. For gout prevention 01/11/23  Yes Doreene Nest, NP  candesartan (ATACAND) 32 MG tablet TAKE 1 TABLET BY MOUTH DAILY. 06/28/22  Yes Chilton Si, MD  carvedilol (COREG) 25 MG tablet Take 1 tablet (25 mg total) by mouth 2 (two) times daily. 02/01/22  Yes Alvstad, Kristin L, RPH-CPP  cetirizine (ZYRTEC) 10 MG tablet TAKE 1 TABLET BY MOUTH AT BEDTIME FOR ALLERGIES Patient taking differently: Take 10 mg by mouth at bedtime. 01/11/23  Yes Doreene Nest, NP  dolutegravir ([name]) 50 MG tablet TAKE 1 TABLET BY MOUTH EVERY DAY 01/11/23  Yes Cliffton Asters, MD  emtricitabine-tenofovir AF (DESCOVY) 200-25 MG tablet TAKE 1 TABLET BY MOUTH EVERY DAY 01/11/23  Yes Cliffton Asters, MD  FLOVENT HFA 110 MCG/ACT inhaler TAKE 1 PUFF BY MOUTH TWICE A DAY Patient taking differently: Inhale 1 puff into the lungs 2 (two) times daily as needed (wheezing/shortness of breath). 08/27/19  Yes Doreene Nest, NP  fluticasone (FLONASE) 50 MCG/ACT nasal spray Place 2 sprays into both nostrils daily. 07/19/21  Yes Eden Emms, NP  gabapentin (NEURONTIN) 300 MG capsule Take 300 mg by mouth daily.   Yes [provider]  glipiZIDE (GLUCOTROL) 10 MG  tablet TAKE 1 TABLET BY MOUTH TWICE A DAY FOR DIABETES Patient taking differently: Take 10 mg by mouth 2 (two) times daily before a meal. 11/15/22  Yes Doreene Nest, NP  hydrALAZINE (APRESOLINE) 50 MG tablet TAKE 1 TABLET (50 MG) BY MOUTH IN THE MORNING AND AT BEDTIME Patient taking differently: Take 50 mg by mouth in the morning and at bedtime. 09/20/22  Yes Chilton Si, MD  levothyroxine (SYNTHROID) 175 MCG tablet TAKE 1 TABLET BY MOUTH EVERY DAY 10/18/22  Yes Carlus Pavlov, MD  minocycline (DYNACIN) 100 MG tablet Take 1 tablet (100 mg total) by mouth 2 (two) times daily. 06/09/22  Yes Cliffton Asters, MD  omeprazole (PRILOSEC) 40 MG capsule Take 1 capsule (40 mg total) by mouth 2 (two) times daily before a meal. 12/19/22  Yes Quentin Mulling R, PA-C  rosuvastatin (CRESTOR) 10 MG tablet TAKE 1 TABLET  BY MOUTH EVERY DAY FOR CHOLESTEROL Patient taking differently: Take 10 mg by mouth daily. FOR CHOLESTEROL 09/20/22  Yes Doreene Nest, NP  sertraline (ZOLOFT) 50 MG tablet TAKE 1 TABLET (50 MG TOTAL) BY MOUTH DAILY. FOR ANXIETY AND DEPRESSION. 11/11/21  Yes Doreene Nest, NP  tamoxifen (NOLVADEX) 20 MG tablet Take 1 tablet (20 mg total) by mouth daily. 07/27/21  Yes Magrinat, Valentino Hue, MD  Blood Glucose Monitoring Suppl (FREESTYLE LITE) w/Device KIT Inject 1 each into the skin daily. 11/10/20   Chilton Si, MD  glucose blood (ONETOUCH VERIO) test strip USE AS INSTRUCTED TO TEST BLOOD SUGAR up to 3 times daily. 11/12/21   Doreene Nest, NP  Lancets [facility] ULTRASOFT) lancets Use as instructed to test blood sugar daily 11/25/20   Doreene Nest, NP     Family History  Problem Relation Age of Onset   Hypertension Mother    Cancer Mother        laryngeal   Heart disease Mother        stent   Pancreatic cancer Father    Hypertension Father    Lung cancer Father 61       hx smoking   Hypertension Sister    Hypertension Sister    Hypertension Brother    Breast cancer  Maternal Aunt 59   Breast cancer Maternal Aunt        dx 60+   Cancer Maternal Uncle        Lung CA   Breast cancer Paternal Aunt 15   Breast cancer Paternal Aunt        dx 11's   Breast cancer Paternal Aunt        dx 50's   Prostate cancer Paternal Uncle    Prostate cancer Paternal Uncle    Lung cancer Paternal Uncle    Breast cancer Cousin 59   Breast cancer Cousin        dx <50   Breast cancer Cousin        dx <50   Breast cancer Cousin        dx <50   Heart disease Other    Hypertension Other    Stroke Other        Grandparent   Kidney disease Other        Grandparent   Diabetes Other        FH of Diabetes   Colon cancer Neg Hx    Esophageal cancer Neg Hx    Rectal cancer Neg Hx    Stomach cancer Neg Hx     Social History   Socioeconomic History   Marital status: Single    Spouse name: Not on file   Number of children: 1   Years of education: Not on file   Highest education level: Not on file  Occupational History   Occupation: Investment banker, corporate  Tobacco Use   Smoking status: Former    Packs/day: 0.50    Years: 15.00    Additional pack years: 0.00    Total pack years: 7.50    Types: Cigarettes    Start date: 04/12/2017    Quit date: 2019    Years since quitting: 5.2   Smokeless tobacco: Never  Vaping Use   Vaping Use: Never used  Substance and Sexual Activity   Alcohol use: Yes    Alcohol/week: 0.0 standard drinks of alcohol    Comment: social   Drug use: No   Sexual activity: Not Currently  Birth control/protection: Post-menopausal    Comment: declined condoms  Other Topics Concern   Not on file  Social History Narrative   Right Handed    Lives in a two story home   Social Determinants of Health   Financial Resource Strain: Not on file  Food Insecurity: Not on file  Transportation Needs: No Transportation Needs (10/25/2018)   PRAPARE - Administrator, Civil Service (Medical): No    Lack of Transportation (Non-Medical): No   Physical Activity: Not on file  Stress: Not on file  Social Connections: Not on file    Review of Systems: A 12 point ROS discussed and pertinent positives are indicated in the HPI above.  All other systems are negative.  Review of Systems  Unable to perform ROS: Mental status change    Vital Signs: BP (!) 110/53   Pulse 71   Temp 98.7 F (37.1 C) (Oral)   Resp 16   Ht 5\' 5"  (1.651 m)   Wt 221 lb 5.5 oz (100.4 kg)   LMP 03/31/2014 (LMP Unknown)   SpO2 98%   BMI 36.83 kg/m   Physical Exam Constitutional:      General: She is not in acute distress.    Appearance: She is not ill-appearing.  Pulmonary:     Effort: Pulmonary effort is normal.  Musculoskeletal:     Right lower leg: Edema present.     Left lower leg: Edema present.  Neurological:     Mental Status: She is alert. She is disoriented.     Imaging: CT Angio Chest/Abd/Pel for Dissection W and/or Wo Contrast  Result Date: 01/29/2023 CLINICAL DATA:  Abdominal pain and back pain, rule out dissection EXAM: CT ANGIOGRAPHY CHEST, ABDOMEN AND PELVIS TECHNIQUE: CT renal stone protocol 01/29/2023 and CT abdomen and pelvis 08/31/2022 Multidetector CT imaging through the chest, abdomen and pelvis was performed using the standard protocol during bolus administration of intravenous contrast. Multiplanar reconstructed images and MIPs were obtained and reviewed to evaluate the vascular anatomy. RADIATION DOSE REDUCTION: This exam was performed according to the departmental dose-optimization program which includes automated exposure control, adjustment of the mA and/or kV according to patient size and/or use of iterative reconstruction technique. CONTRAST:  OMNIPAQUE IOHEXOL 350 MG/ML SOLN COMPARISON:  None Available. 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 There is a windsock sign in the mid  thoracic descending aorta (circa series 5/image 81) compatible with intimointimal intussusception. The dissection flap extends along the right aortic sidewall into the abdominal aorta. The lower thoracic descending aorta near the hiatus measures 44 mm in diameter and is similar in size to CT 01/29/2023 at 1:04 a.m. given differences in measuring technique. Mild wall thickening of the wall of the aortic arch. Small pericardial effusion. Cardiomegaly. The main pulmonary artery is mildly dilated measuring 35 mm in diameter. Mediastinum/Nodes: Unremarkable esophagus. Prominent mediastinal lymph nodes. For example a right paratracheal node measures 1.5 cm in short axis (5/51). These are similar to slightly increased from 09/27/2018 and favored reactive. Normal thyroid. Lungs/Pleura: Interlobular septal thickening greatest in the lower lungs with patchy ground-glass opacities peripherally. Atelectasis or consolidation in the left lower lobe. Musculoskeletal: No acute abnormality. Review of the MIP images confirms the above findings. CTA ABDOMEN AND PELVIS FINDINGS VASCULAR Aorta: The type B descending thoracic aortic dissection continues into the abdomen extending along the right lateral wall of the aorta  likely into the right common iliac artery, external iliac artery, and into the common femoral artery with termination at the bifurcation into the superficial femoral and profunda femoral artery. Celiac: The dissection flap abuts the celiac axis which is patent and arises from the true lumen. SMA: The dissection flap appears to cover the origin of the SMA which appears to arise from the false lumen. The SMA is well opacified. Renals: The left renal artery is patent and arises from the true lumen. The right renal artery arises from the false lumen and is patent and opacified. IMA: The IMA is patent and arises from the true lumen. Inflow: The dissection flap continues through the left common iliac, external iliac, and  common femoral artery terminating at the bifurcation into the superficial and profunda femoral arteries. The left internal iliac artery is not opacified likely occluded. , Definitive the right common iliac artery appears to arise from the true lumen. The right common iliac, external iliac, and internal iliac arteries are patent. Common femoral artery is patent on the right. Veins: No obvious venous abnormality within the limitations of this arterial phase study. Review of the MIP images confirms the above findings. NON-VASCULAR Hepatobiliary: No focal liver abnormality is seen. No gallstones, gallbladder wall thickening, or biliary dilatation. Pancreas: Unremarkable. No pancreatic ductal dilatation or surrounding inflammatory changes. Spleen: Normal in size without focal abnormality. Adrenals/Urinary Tract: Stable adrenal glands. Cortical renal scarring right kidney. Nonobstructing right renal calculi. No hydronephrosis. Unremarkable bladder. Stomach/Bowel: Normal caliber large and small bowel. No bowel wall thickening to suggest ischemia. Stomach is within normal limits. Lymphatic: No lymphadenopathy. Reproductive: Uterus and bilateral adnexa are unremarkable. Other: No free intraperitoneal fluid or air. Musculoskeletal: No acute osseous abnormality. Review of the MIP images confirms the above findings. IMPRESSION: 1. 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 There is a windsock sign in the mid descending thoracic aorta compatible with intimointimal intussusception. The dissection flap extends into the abdominal aorta, right common iliac artery, right external iliac artery, and left common femoral artery terminating at the bifurcation into the superficial and profunda femoral arteries. The left internal iliac artery is not opacified likely occluded. The celiac axis, left renal artery, and IMA arise from the true lumen. The  right renal artery and probably the SMA arise from the false lumen. 2. Mild wall thickening of the aortic arch. 3. Cardiomegaly with small pericardial effusion. 4. Mildly dilated main pulmonary artery which can be seen in the setting of pulmonary hypertension. 5. Interlobular septal thickening and patchy ground-glass opacities greatest in the lower lungs concerning for pulmonary edema. Trace bilateral pleural effusions. 6. Nonobstructing right renal calculi. Critical Value/emergent results were called by telephone at the time of interpretation on 01/29/2023 at 2:39 am to provider [facility] , who verbally acknowledged these results. Electronically Signed   By: Minerva Fester M.D.   On: 01/29/2023 02:39   CT Renal Stone Study  Result Date: 01/29/2023 CLINICAL DATA:  lower back pain that started yesterday and multiple episodes of emesis today. Pt also endorses increases SOB. HX of similar pain back in September, unknown etiology. Hx kidney stones. EXAM: CT ABDOMEN AND PELVIS WITHOUT CONTRAST TECHNIQUE: Multidetector CT imaging of the abdomen and pelvis was performed following the standard protocol without IV contrast. RADIATION DOSE REDUCTION: This exam was performed according to the departmental dose-optimization program which includes automated exposure control, adjustment of the mA and/or kV according  to patient size and/or use of iterative reconstruction technique. COMPARISON:  CT abdomen pelvis 10/31/2021 FINDINGS: Lower chest: Bilateral trace pleural effusions. Passive atelectasis. Cardiac findings suggestive of anemia. 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 Hepatobiliary: No focal liver abnormality. No gallstones, gallbladder wall thickening, or pericholecystic fluid. No biliary dilatation. Pancreas: No focal lesion. Normal pancreatic contour. No surrounding inflammatory changes. No main pancreatic ductal dilatation.  Spleen: Normal in size without focal abnormality. Adrenals/Urinary Tract: No adrenal nodule bilaterally. Right nephrolithiasis measuring up to 7 mm. No left nephrolithiasis. No ureterolithiasis. The urinary bladder is unremarkable. Stomach/Bowel: Stomach is within normal limits. No evidence of bowel wall thickening or dilatation. Colonic diverticulosis. Appendix appears normal. Vascular/Lymphatic: Interval development of dissection of the infrarenal abdominal aorta (2:39) that extends to at least the left common iliac artery (2:50). No abdominal aorta or iliac aneurysm. Question slight periaortic fat stranding (2:42). Mild atherosclerotic plaque of the aorta and its branches. No abdominal, pelvic, or inguinal lymphadenopathy. Reproductive: Uterus and bilateral adnexa are unremarkable. Other: No intraperitoneal free fluid. No intraperitoneal free gas. No organized fluid collection. Musculoskeletal: No abdominal wall hernia or abnormality. No suspicious lytic or blastic osseous lesions. No acute displaced fracture. Multilevel degenerative changes of the spine. IMPRESSION: 1. Findings suggestive of interval development of dissection of the infrarenal abdominal aorta likely extending to the left common iliac artery. 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 Recommend emergent CTA of the chest, abdomen, pelvis (OK to do even though Creatinine of 6 as benefits > risks). 2. Nonobstructive right nephrolithiasis measuring up to 7 mm. 3. Colonic diverticulosis with no acute diverticulitis. 4. Bilateral trace pleural effusions. These results were called by telephone at the time of interpretation on 01/29/2023 at 1:36 am to provider [facility] , who verbally acknowledged these results. Electronically Signed   By: Tish Frederickson M.D.   On: 01/29/2023 01:40    Labs:  CBC: Recent Labs    08/31/22 1213 12/05/22 0000 12/19/22 1242 01/28/23 2358 01/30/23 0355   WBC 8.4  --  7.7 14.2* 12.6*  HGB 11.1* 9.6* 9.4* 8.7* 7.2*  HCT 33.3*  --  28.2* 25.7* 22.1*  PLT 216  --  248.0 180 175    COAGS: No results for input(s): "INR", "APTT" in the last 8760 hours.  BMP: Recent Labs    08/31/22 1213 09/15/22 1156 09/23/22 1321 12/05/22 0000 12/19/22 1242 01/28/23 2358 01/30/23 0355  NA 141   < > 141 142 140 139 132*  K 3.3*   < > 3.6 3.6 3.4* 3.7 4.4  CL 102   < > 102 106 101 102 98  CO2 30   < > 32 27* 30 24 20*  GLUCOSE 80   < > 120*  --  115* 156* 120*  BUN 29*   < > 32* 44* 41* 54* 60*  CALCIUM 9.0   < > 8.9 8.9 9.2 9.3 7.8*  CREATININE 2.89*   < > 2.86* 4.1* 4.26* 5.99* 7.44*  GFRNONAA 19*  --   --   --   --  8* 6*   < > = values in this interval not displayed.    LIVER FUNCTION TESTS: Recent Labs    08/29/22 1254 08/31/22 1213 12/05/22 0000 12/19/22 1242 01/28/23 2358  BILITOT 0.4 0.4  --  0.3 0.3  AST 15 15  --  16 17  ALT 11 11  --  9 12  ALKPHOS 77 72  --  98 97  PROT 6.6 7.7  --  7.5 8.1  ALBUMIN 3.2* 3.4* 3.5 3.2* 3.7    TUMOR MARKERS: No results for input(s): "AFPTM", "CEA", "CA199", "CHROMGRNA" in the last 8760 hours.  Assessment and Plan:  ESRD; pending hemodialysis: [name], 57 year old female, is tentatively scheduled today for an image-guided tunneled dialysis catheter placement. The procedure was discussed over the phone with the patient's daughter, [name].   Risks and benefits discussed with the patient including, but not limited to bleeding, infection, vascular injury, pneumothorax which may require chest tube placement, air embolism or even death  All of the patient's questions were answered, patient is agreeable to proceed. She has been NPO.   Consent signed and in IR.  Thank you for this interesting consult.  I greatly enjoyed meeting [name] and look forward to participating in their care.  A copy of this report was sent to the requesting provider on this  date.  Electronically Signed: Alwyn Ren, AGACNP-BC [phone] 01/30/2023, 11:56 AM   I spent a total of 20 Minutes    in face to face in clinical consultation, greater than 50% of which was counseling/coordinating care for tunneled hemodialysis catheter.

## 2023-01-30 NOTE — Progress Notes (Addendum)
  Progress Note    01/30/2023 8:02 AM * No surgery found *  Subjective:  no complaints    Vitals:   01/30/23 0754 01/30/23 0757  BP:  (!) 121/50  Pulse:  69  Resp:    Temp: 98.7 F (37.1 C)   SpO2:      Physical Exam: Cardiac:  regular, SBPs 110s-120s Lungs:  nonlabored Extremities:  palpable DP and radial pulses bilaterally. Normal motor and sensory exam   CBC    Component Value Date/Time   WBC 12.6 (H) 01/30/2023 0355   RBC 2.17 (L) 01/30/2023 0355   HGB 7.2 (L) 01/30/2023 0355   HGB 11.7 (L) 07/27/2021 1110   HCT 22.1 (L) 01/30/2023 0355   PLT 175 01/30/2023 0355   PLT 228 07/27/2021 1110   MCV 101.8 (H) 01/30/2023 0355   MCH 33.2 01/30/2023 0355   MCHC 32.6 01/30/2023 0355   RDW 16.9 (H) 01/30/2023 0355   LYMPHSABS 1.6 12/19/2022 1242   MONOABS 0.7 12/19/2022 1242   EOSABS 0.2 12/19/2022 1242   BASOSABS 0.0 12/19/2022 1242    BMET    Component Value Date/Time   NA 132 (L) 01/30/2023 0355   NA 142 12/05/2022 0000   K 4.4 01/30/2023 0355   CL 98 01/30/2023 0355   CO2 20 (L) 01/30/2023 0355   GLUCOSE 120 (H) 01/30/2023 0355   BUN 60 (H) 01/30/2023 0355   BUN 44 (A) 12/05/2022 0000   CREATININE 7.44 (H) 01/30/2023 0355   CREATININE 2.72 (H) 07/27/2021 1110   CREATININE 2.74 (H) 03/17/2021 1050   CALCIUM 7.8 (L) 01/30/2023 0355   GFRNONAA 6 (L) 01/30/2023 0355   GFRNONAA 20 (L) 07/27/2021 1110   GFRAA 39 (L) 07/14/2020 1053   GFRAA 33 (L) 08/12/2019 1447    INR    Component Value Date/Time   INR 1.0 07/14/2019 0312     Intake/Output Summary (Last 24 hours) at 01/30/2023 0802 Last data filed at 01/30/2023 0700 Gross per 24 hour  Intake 2375.77 ml  Output 400 ml  Net 1975.77 ml      Assessment/Plan:  57 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 Abdomen is soft and nontender -Distal pulses intact with no signs of malperfusion. Radial and DP pulses palpable bilaterally -Continue BP  management, good control currently. Will repeat CT scan tomorrow for interval changes   Loel Dubonnet, PA-C [facility] [phone] 01/30/2023 8:02 AM  VASCULAR STAFF ADDENDUM:  I have independently interviewed and examined the patient. I agree with the above.  No pain this morning. States that she feels foggy. I believe there is a component of uremia. IR consulted for [facility].  Plan to start HD tomorrow Rescan tomorrow.  Pt aware she will require dialysis.   Fara Olden, MD [facility] Phone Number: [phone] 01/30/2023 9:59 AM

## 2023-01-30 NOTE — Procedures (Signed)
Interventional Radiology Procedure Note  Procedure: RT IJ TUNNELED HD CATH    Complications: None  Estimated Blood Loss:  MIN  Findings: TIP SVCRA    M. TREVOR Chandrika Sandles, MD    

## 2023-01-30 NOTE — Progress Notes (Addendum)
Family requesting that patient's dog visit. Immunization records verified and up to date. DD and charge RN aware. Policy reviewed with family. Pet will visit this evening after all of patient's procedures are complete.  Addendum: patient's family has decided to wait until 4/16 to bring dog.

## 2023-01-30 NOTE — Progress Notes (Signed)
NAME:  [name], MRN:  [id number], DOB:  September 02, 1966, LOS: 1 ADMISSION DATE:  01/29/2023, CONSULTATION DATE:  01/29/23 REFERRING MD:  EDP, CHIEF COMPLAINT:  aortic dissection   History of Present Illness:  57 yo female with pmh t2dm, ckd4, hiv, sarcoidosis who prsented to [facility] with complaints of back and abdominal pain with nausea and non bloody vomiting. Her family at bedside endorse this has been ongoing for 2 days not. Family provides most history as pt is quite drowsy after pain medication. The state that pt had no other associated symptoms. She had been trying to treat herself for consitpation thinking that was the cause of her pain. No fevers/ chills. No diarrhea. No recent change in medications.   She presented with elevated BP. Systolics >200. She was started on esmolol and nitro gtts and vascular is coming to eval.   Pertinent  Medical History  T2dm Htn Hyperlipidemia Hiv Hypothyroidism ckd4  Significant Hospital Events: Including procedures, antibiotic start and stop dates in addition to other pertinent events   Admitted to hospital 4/14, on cardene and esmolol  Interim History / Subjective:  This morning she is sleepy and less alert than normal. She was weaned off esmolol this morning, remains on nicardipine.  I/O cath for high urinary residuals. IR consulted for [facility] per nephro.  Objective   Blood pressure (!) 110/53, pulse 73, temperature 98.7 F (37.1 C), temperature source Oral, resp. rate 16, height 5\' 5"  (1.651 m), weight 100.4 kg, last menstrual period 03/31/2014, SpO2 98 %.        Intake/Output Summary (Last 24 hours) at 01/30/2023 1047 Last data filed at 01/30/2023 1000 Gross per 24 hour  Intake 2411.41 ml  Output 400 ml  Net 2011.41 ml   Filed Weights   01/28/23 2347 01/29/23 0530 01/30/23 0500  Weight: 96.2 kg 96.2 kg 100.4 kg    Examination: General: chronically ill appearing woman lying in bed in NAD  HENT: [place]/AT, eyes  anicteric Lungs: CTAB, breathing comfortably on RA Cardiovascular: S1S2, RRR Abdomen: obese, soft, NT Extremities: no c/c, minimal pitting edema Neuro: arousable but short attention span, falls back asleep. Answers some questions. Moves all extremities with significant prompting.  Derm: warm, dry  Na+ 132 Bicarb 20 BUN 60 Cr 7.44 WBC 12.6 H/H 7.2/22.1 Platelets 175  Echo: LVEF 55-60%, mild concentric LVH, G2DD. Mildly dilated LA, mild to mod MR. Redemonstration of aortic dissection in abdominal aorta. IVC dilated with reduced respiratory variability.   Resolved Hospital Problem list     Assessment & Plan:  Type B aortic dissection from proximal descending thoracic aorta distal or L subclavian to iliacs, extends further into the left leg to bifurcation of superficial and profunda femoral arteries.  Hypertensive emergency -con't cleviprex for goal SBP <120, HR <60. Weaned off esmolol.  -con't amlodipine, coreg -adding hydralazine -repeat CTA tomorrow -appreciate vascular surgery's management  AKI on CKD 4; baseline Cr 4.2 and rising over the past few months. High risk for needing HD soon.  -foley placed for high residuals, strict I/O -renally dose meds, avoid nephrotoxic meds -appreciate Nephrology's management; planning for [facility] today and likely HD tomorrow.   DM2  -SSI PRN -goal BG 140-180 -semglee 5 units daily -hold PTA glipizide  Nausea -zofran  HIV; well controlled recently with undetectable VL and CD4 685 in 12/2022 -con't PTA ART  Hypothyroidism -levothyroxine  Acute metabolic encephalopathy due to renal failure -hold sedating meds -monitor in ICU -agree with HD tomorrow  GERD -con't PPI  Daughter and niece updated at bedside today. Questions answered regarding HD. Her daughter was concerned about dialysis most likely being permanent.   Best Practice (right click and "Reselect all SmartList Selections" daily)   Diet/type: Regular consistency (see  orders)- renal DVT prophylaxis: SCD GI prophylaxis: PPI Lines: Dialysis Catheter Foley:  N/A Code Status:  full code Last date of multidisciplinary goals of care discussion [full code 01/29/23]  Labs   CBC: Recent Labs  Lab 01/28/23 2358 01/30/23 0355  WBC 14.2* 12.6*  HGB 8.7* 7.2*  HCT 25.7* 22.1*  MCV 97.0 101.8*  PLT 180 175     Basic Metabolic Panel: Recent Labs  Lab 01/28/23 2358 01/30/23 0355  NA 139 132*  K 3.7 4.4  CL 102 98  CO2 24 20*  GLUCOSE 156* 120*  BUN 54* 60*  CREATININE 5.99* 7.44*  CALCIUM 9.3 7.8*    GFR: Estimated Creatinine Clearance: 10.2 mL/min (A) (by C-G formula based on SCr of 7.44 mg/dL (H)). Recent Labs  Lab 01/28/23 2358 01/30/23 0355  WBC 14.2* 12.6*       Critical care time:        This patient is critically ill with multiple organ system failure which requires frequent high complexity decision making, assessment, support, evaluation, and titration of therapies. This was completed through the application of advanced monitoring technologies and extensive interpretation of multiple databases. During this encounter critical care time was devoted to patient care services described in this note for 36 minutes.  Steffanie Dunn, DO 01/30/23 4:24 PM [facility]  For contact information, see Amion. If no response to pager, please call PCCM consult pager. After hours, 7PM- 7AM, please call Elink.

## 2023-01-30 NOTE — Progress Notes (Signed)
[facility] Progress Note    Assessment/ Plan:   CKD 5, now ESRD -followed by our office -urine output low, does have some retention, would recommend intermittent bladder scans -she is exhibiting uremic symptoms now, would recommend starting HD inpatient. Patient is agreeable. Can likely hold off on AVF/AVG placement for now as she is interested in PD down the road. IR consulted for [facility] placement. Will tentatively plan for HD start tomorrow. Will need CLIP thereafter -Follow daily labs, strict I/O -Avoid nephrotoxic medications including NSAIDs and iodinated intravenous contrast exposure unless the latter is absolutely indicated.  Preferred narcotic agents for pain control are hydromorphone, fentanyl, and methadone. Morphine should not be used. Avoid Baclofen and avoid oral sodium phosphate and magnesium citrate based laxatives / bowel preps. Continue strict Input and Output monitoring. 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 Both kidneys are perfusion -repeat CT tomorrow  HTN -off esmolol now, currently on cleviprex -per CCM  HIV -on HAART  DM -per primary service  Anthony Sar, MD [facility]  Subjective:   Patient seen and examined bedside. Esmolol now off. She does report nausea and dysgeusia. Patient does report that she is not making much urine. Has had high residuals overnight, in and out cath ordered. Discussed with RN on the side and some tremors and itchiness has been noticed.   Objective:   BP (!) 121/50   Pulse 70   Temp 98.7 F (37.1 C) (Oral)   Resp 18   Ht  (1.651 m)   Wt 100.4 kg   LMP 03/31/2014 (LMP Unknown)   SpO2 95%   BMI 36.83 kg/m   Intake/Output Summary (Last 24 hours) at 01/30/2023 0847 Last data filed at 01/30/2023 0800 Gross per 24 hour  Intake 2417.68 ml  Output 400  ml  Net 2017.68 ml   Weight change: 4.237 kg  Physical Exam: Gen: NAD CVS: RRR Resp: CTA b/l Abd: soft, nt Ext: trace edema Neuro: awake  Imaging: CT Angio Chest/Abd/Pel for Dissection W and/or Wo Contrast  Result Date: 01/29/2023 CLINICAL DATA:  Abdominal pain and back pain, rule out dissection EXAM: CT ANGIOGRAPHY CHEST, ABDOMEN AND PELVIS TECHNIQUE: CT renal stone protocol 01/29/2023 and CT abdomen and pelvis 08/31/2022 Multidetector CT imaging through the chest, abdomen and pelvis was performed using the standard protocol during bolus administration of intravenous contrast. Multiplanar reconstructed images and MIPs were obtained and reviewed to evaluate the vascular anatomy. RADIATION DOSE REDUCTION: This exam was performed according to the departmental dose-optimization program which includes automated exposure control, adjustment of the mA and/or kV according to patient size and/or use of iterative reconstruction technique. CONTRAST:  OMNIPAQUE IOHEXOL 350 MG/ML SOLN COMPARISON:  None Available. 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 There is a windsock sign in the mid thoracic descending aorta (circa series 5/image 81) compatible with intimointimal intussusception. The dissection flap extends along the right aortic sidewall into the abdominal aorta. The lower thoracic descending aorta near the hiatus measures 44 mm in diameter and is similar in size to CT 01/29/2023 at 1:04 a.m. given differences in measuring technique. Mild wall thickening of the wall of the aortic arch. Small pericardial effusion. Cardiomegaly. The main pulmonary artery is mildly dilated measuring 35 mm in diameter. Mediastinum/Nodes: Unremarkable esophagus.  Prominent mediastinal lymph nodes. For example a right paratracheal node measures 1.5 cm in short axis (5/51). These are similar to  slightly increased from 09/27/2018 and favored reactive. Normal thyroid. Lungs/Pleura: Interlobular septal thickening greatest in the lower lungs with patchy ground-glass opacities peripherally. Atelectasis or consolidation in the left lower lobe. Musculoskeletal: No acute abnormality. Review of the MIP images confirms the above findings. CTA ABDOMEN AND PELVIS FINDINGS VASCULAR Aorta: The type B descending thoracic aortic dissection continues into the abdomen extending along the right lateral wall of the aorta likely into the right common iliac artery, external iliac artery, and into the common femoral artery with termination at the bifurcation into the superficial femoral and profunda femoral artery. Celiac: The dissection flap abuts the celiac axis which is patent and arises from the true lumen. SMA: The dissection flap appears to cover the origin of the SMA which appears to arise from the false lumen. The SMA is well opacified. Renals: The left renal artery is patent and arises from the true lumen. The right renal artery arises from the false lumen and is patent and opacified. IMA: The IMA is patent and arises from the true lumen. Inflow: The dissection flap continues through the left common iliac, external iliac, and common femoral artery terminating at the bifurcation into the superficial and profunda femoral arteries. The left internal iliac artery is not opacified likely occluded. , Definitive the right common iliac artery appears to arise from the true lumen. The right common iliac, external iliac, and internal iliac arteries are patent. Common femoral artery is patent on the right. Veins: No obvious venous abnormality within the limitations of this arterial phase study. Review of the MIP images confirms the above findings. NON-VASCULAR Hepatobiliary: No focal liver abnormality is seen. No gallstones, gallbladder wall thickening, or biliary dilatation. Pancreas: Unremarkable. No pancreatic ductal dilatation  or surrounding inflammatory changes. Spleen: Normal in size without focal abnormality. Adrenals/Urinary Tract: Stable adrenal glands. Cortical renal scarring right kidney. Nonobstructing right renal calculi. No hydronephrosis. Unremarkable bladder. Stomach/Bowel: Normal caliber large and small bowel. No bowel wall thickening to suggest ischemia. Stomach is within normal limits. Lymphatic: No lymphadenopathy. Reproductive: Uterus and bilateral adnexa are unremarkable. Other: No free intraperitoneal fluid or air. Musculoskeletal: No acute osseous abnormality. Review of the MIP images confirms the above findings. IMPRESSION: 1. 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 There is a windsock sign in the mid descending thoracic aorta compatible with intimointimal intussusception. The dissection flap extends into the abdominal aorta, right common iliac artery, right external iliac artery, and left common femoral artery terminating at the bifurcation into the superficial and profunda femoral arteries. The left internal iliac artery is not opacified likely occluded. The celiac axis, left renal artery, and IMA arise from the true lumen. The right renal artery and probably the SMA arise from the false lumen. 2. Mild wall thickening of the aortic arch. 3. Cardiomegaly with small pericardial effusion. 4. Mildly dilated main pulmonary artery which can be seen in the setting of pulmonary hypertension. 5. Interlobular septal thickening and patchy ground-glass opacities greatest in the lower lungs concerning for pulmonary edema. Trace bilateral pleural effusions. 6. Nonobstructing right renal calculi. Critical Value/emergent results were called by telephone at the time of interpretation on 01/29/2023 at 2:39 am to provider [facility] , who verbally acknowledged these results. Electronically Signed   By: Minerva Fester M.D.   On: 01/29/2023  02:39   CT Renal Stone Study  Result Date: 01/29/2023 CLINICAL DATA:  lower back pain that started yesterday and multiple episodes of emesis today. Pt also endorses increases SOB. HX of similar pain back in September, unknown etiology. Hx kidney stones. EXAM: CT ABDOMEN AND PELVIS WITHOUT CONTRAST TECHNIQUE: Multidetector CT imaging of the abdomen and pelvis was performed following the standard protocol without IV contrast. RADIATION DOSE REDUCTION: This exam was performed according to the departmental dose-optimization program which includes automated exposure control, adjustment of the mA and/or kV according to patient size and/or use of iterative reconstruction technique. COMPARISON:  CT abdomen pelvis 10/31/2021 FINDINGS: Lower chest: Bilateral trace pleural effusions. Passive atelectasis. Cardiac findings suggestive of anemia. 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 Hepatobiliary: No focal liver abnormality. No gallstones, gallbladder wall thickening, or pericholecystic fluid. No biliary dilatation. Pancreas: No focal lesion. Normal pancreatic contour. No surrounding inflammatory changes. No main pancreatic ductal dilatation. Spleen: Normal in size without focal abnormality. Adrenals/Urinary Tract: No adrenal nodule bilaterally. Right nephrolithiasis measuring up to 7 mm. No left nephrolithiasis. No ureterolithiasis. The urinary bladder is unremarkable. Stomach/Bowel: Stomach is within normal limits. No evidence of bowel wall thickening or dilatation. Colonic diverticulosis. Appendix appears normal. Vascular/Lymphatic: Interval development of dissection of the infrarenal abdominal aorta (2:39) that extends to at least the left common iliac artery (2:50). No abdominal aorta or iliac aneurysm. Question slight periaortic fat stranding (2:42). Mild atherosclerotic plaque of the aorta and its branches. No abdominal, pelvic, or  inguinal lymphadenopathy. Reproductive: Uterus and bilateral adnexa are unremarkable. Other: No intraperitoneal free fluid. No intraperitoneal free gas. No organized fluid collection. Musculoskeletal: No abdominal wall hernia or abnormality. No suspicious lytic or blastic osseous lesions. No acute displaced fracture. Multilevel degenerative changes of the spine. IMPRESSION: 1. Findings suggestive of interval development of dissection of the infrarenal abdominal aorta likely extending to the left common iliac artery. 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 Recommend emergent CTA of the chest, abdomen, pelvis (OK to do even though Creatinine of 6 as benefits > risks). 2. Nonobstructive right nephrolithiasis measuring up to 7 mm. 3. Colonic diverticulosis with no acute diverticulitis. 4. Bilateral trace pleural effusions. These results were called by telephone at the time of interpretation on 01/29/2023 at 1:36 am to provider [facility] , who verbally acknowledged these results. Electronically Signed   By: Tish Frederickson M.D.   On: 01/29/2023 01:40    Labs: BMET Recent Labs  Lab 01/28/23 2358 01/30/23 0355  NA 139 132*  K 3.7 4.4  CL 102 98  CO2 24 20*  GLUCOSE 156* 120*  BUN 54* 60*  CREATININE 5.99* 7.44*  CALCIUM 9.3 7.8*   CBC Recent Labs  Lab 01/28/23 2358 01/30/23 0355  WBC 14.2* 12.6*  HGB 8.7* 7.2*  HCT 25.7* 22.1*  MCV 97.0 101.8*  PLT 180 175    Medications:     amLODipine  10 mg Oral Daily   carvedilol  25 mg Oral BID WC   Chlorhexidine Gluconate Cloth  6 each Topical Daily   dolutegravir  50 mg Oral Daily   emtricitabine-tenofovir AF  1 tablet Oral Daily   insulin aspart  0-15 Units Subcutaneous Q4H   levothyroxine  175 mcg Oral Q0600   pantoprazole  40 mg Oral Daily   rosuvastatin  10 mg Oral Daily   sertraline  50 mg Oral Daily      Anthony Sar, MD [facility] 01/30/2023, 8:47 AM

## 2023-01-30 NOTE — Progress Notes (Signed)
eLink Physician-Brief Progress Note Patient Name: [name] DOB: 1966-04-10 MRN: [id number]   Date of Service  01/30/2023  HPI/Events of Note  57 year old female in ICU with type B aortic dissection, hypertensive emergency being managed medically by vascular surgery.  Patient with high urinary residuals.  eICU Interventions  In-N-Out catheter ordered for patient. Continue medical management of type B dissection.        Carilyn Goodpasture 01/30/2023, 3:15 AM

## 2023-01-30 NOTE — Progress Notes (Signed)
eLink Physician-Brief Progress Note Patient Name: [name] DOB: [birth year] MRN: [id number]   Date of Service  01/30/2023  HPI/Events of Note  Patient now complaining of 10 out of 10 pain similar to the pain she had on presentation which was relieved with hydromorphone.  I examined her on camera in her room.  Appears to be in distress.  eICU Interventions  Hydromorphone ordered at 0.5 mg every 2 hours as needed.  Will adjust dose based on response.     Intervention Category Intermediate Interventions: Pain - evaluation and management  Carilyn Goodpasture 01/30/2023, 4:15 AM

## 2023-01-31 ENCOUNTER — Inpatient Hospital Stay (HOSPITAL_COMMUNITY): Payer: Medicare Other

## 2023-01-31 DIAGNOSIS — I161 Hypertensive emergency: Secondary | ICD-10-CM | POA: Diagnosis not present

## 2023-01-31 DIAGNOSIS — N179 Acute kidney failure, unspecified: Secondary | ICD-10-CM | POA: Diagnosis not present

## 2023-01-31 LAB — LACTIC ACID, PLASMA: Lactic Acid, Venous: 0.9 mmol/L (ref 0.5–1.9)

## 2023-01-31 LAB — GLUCOSE, CAPILLARY
Glucose-Capillary: 101 mg/dL — ABNORMAL HIGH (ref 70–99)
Glucose-Capillary: 106 mg/dL — ABNORMAL HIGH (ref 70–99)
Glucose-Capillary: 121 mg/dL — ABNORMAL HIGH (ref 70–99)
Glucose-Capillary: 136 mg/dL — ABNORMAL HIGH (ref 70–99)
Glucose-Capillary: 138 mg/dL — ABNORMAL HIGH (ref 70–99)
Glucose-Capillary: 171 mg/dL — ABNORMAL HIGH (ref 70–99)

## 2023-01-31 LAB — TRIGLYCERIDES: Triglycerides: 222 mg/dL — ABNORMAL HIGH (ref <150)

## 2023-01-31 LAB — HEPATITIS B SURFACE ANTIBODY, QUANTITATIVE: Hep B S AB Quant (Post): 4.6 m[IU]/mL — ABNORMAL LOW (ref >9.9)

## 2023-01-31 MED ORDER — LOSARTAN POTASSIUM 50 MG PO TABS
50.0000 mg | ORAL_TABLET | Freq: Every day | ORAL | Status: DC
  Administered 2023-01-31: 50 mg via ORAL
  Filled 2023-01-31: qty 1

## 2023-01-31 MED ORDER — HYDRALAZINE HCL 50 MG PO TABS
50.0000 mg | ORAL_TABLET | Freq: Three times a day (TID) | ORAL | Status: DC
  Administered 2023-01-31: 50 mg via ORAL
  Filled 2023-01-31: qty 1

## 2023-01-31 MED ORDER — LABETALOL HCL 5 MG/ML IV SOLN
5.0000 mg | INTRAVENOUS | Status: DC | PRN
  Administered 2023-01-31: 5 mg via INTRAVENOUS
  Administered 2023-02-01 – 2023-02-02 (×3): 10 mg via INTRAVENOUS
  Filled 2023-01-31 (×2): qty 4

## 2023-01-31 MED ORDER — HYDRALAZINE HCL 20 MG/ML IJ SOLN
10.0000 mg | INTRAMUSCULAR | Status: DC | PRN
  Administered 2023-01-31 – 2023-02-01 (×3): 10 mg via INTRAVENOUS
  Filled 2023-01-31 (×3): qty 1

## 2023-01-31 MED ORDER — HYDRALAZINE HCL 50 MG PO TABS
100.0000 mg | ORAL_TABLET | Freq: Three times a day (TID) | ORAL | Status: DC
  Administered 2023-01-31 – 2023-02-01 (×4): 100 mg via ORAL
  Filled 2023-01-31 (×4): qty 2

## 2023-01-31 MED ORDER — CARVEDILOL 25 MG PO TABS
25.0000 mg | ORAL_TABLET | Freq: Two times a day (BID) | ORAL | Status: DC
  Administered 2023-02-01: 25 mg via ORAL
  Filled 2023-01-31 (×2): qty 1

## 2023-01-31 MED ORDER — HEPARIN SODIUM (PORCINE) 1000 UNIT/ML IJ SOLN
INTRAMUSCULAR | Status: AC
  Administered 2023-01-31: 1000 [IU]
  Filled 2023-01-31: qty 4

## 2023-01-31 MED ORDER — HYDRALAZINE HCL 50 MG PO TABS: 100.0000 mg | ORAL_TABLET | Freq: Three times a day (TID) | ORAL | Status: DC

## 2023-01-31 MED ORDER — IOHEXOL 350 MG/ML SOLN
100.0000 mL | Freq: Once | INTRAVENOUS | Status: AC | PRN
  Administered 2023-01-31: 100 mL via INTRAVENOUS

## 2023-01-31 NOTE — Progress Notes (Signed)
Received patient in bed I room Alert and oriented.  Informed consent signed and in chart.   TX duration:2 h  Patient tolerated well. without acute distress.  Hand-off given to patient's nurse.   Access used: catheter Access issues: none  Total UF removed: 500 ml Medication(s) given: none Post HD VS: 150/62 P 70 R 16 Post HD weight: 100 kg   [facility]

## 2023-01-31 NOTE — Progress Notes (Addendum)
  Progress Note    01/31/2023 7:45 AM * No surgery found *  Subjective:  a little confused due to uremia. Denies any abdominal, back, or chest pain    Vitals:   01/31/23 0630 01/31/23 0645  BP:    Pulse: 67 66  Resp: 18 18  Temp:    SpO2: 98% 98%    Physical Exam: General:  resting comfortably Cardiac:  regular, SBPs 120s-130s Lungs:  nonlabored Extremities:  palpable radial and DP pulses bilaterally Abdomen:  soft, NT, ND  CBC    Component Value Date/Time   WBC 12.6 (H) 01/30/2023 0355   RBC 2.17 (L) 01/30/2023 0355   HGB 7.2 (L) 01/30/2023 0355   HGB 11.7 (L) 07/27/2021 1110   HCT 22.1 (L) 01/30/2023 0355   PLT 175 01/30/2023 0355   PLT 228 07/27/2021 1110   MCV 101.8 (H) 01/30/2023 0355   MCH 33.2 01/30/2023 0355   MCHC 32.6 01/30/2023 0355   RDW 16.9 (H) 01/30/2023 0355   LYMPHSABS 1.6 12/19/2022 1242   MONOABS 0.7 12/19/2022 1242   EOSABS 0.2 12/19/2022 1242   BASOSABS 0.0 12/19/2022 1242    BMET    Component Value Date/Time   NA 132 (L) 01/30/2023 0355   NA 142 12/05/2022 0000   K 4.4 01/30/2023 0355   CL 98 01/30/2023 0355   CO2 20 (L) 01/30/2023 0355   GLUCOSE 120 (H) 01/30/2023 0355   BUN 60 (H) 01/30/2023 0355   BUN 44 (A) 12/05/2022 0000   CREATININE 7.44 (H) 01/30/2023 0355   CREATININE 2.72 (H) 07/27/2021 1110   CREATININE 2.74 (H) 03/17/2021 1050   CALCIUM 7.8 (L) 01/30/2023 0355   GFRNONAA 6 (L) 01/30/2023 0355   GFRNONAA 20 (L) 07/27/2021 1110   GFRAA 39 (L) 07/14/2020 1053   GFRAA 33 (L) 08/12/2019 1447    INR    Component Value Date/Time   INR 1.0 07/14/2019 0312     Intake/Output Summary (Last 24 hours) at 01/31/2023 0745 Last data filed at 01/31/2023 0600 Gross per 24 hour  Intake 1481.95 ml  Output 0 ml  Net 1481.95 ml      Assessment/Plan:  57 y.o. female with type B aortic dissection   -Unchanged physical exam which is reassuring. Palpable radial and DP pulses bilaterally with no signs of  malperfusion -Denies any chest, back, or abdominal pain. Still quite confused due to uremia. TDC was placed yesterday and she will get dialysis today -Continue medical management for asymptomatic Type B aortic dissection. Will order rescan for today  Loel Dubonnet, PA-C [facility] [phone] 01/31/2023 7:45 AM  VASCULAR STAFF ADDENDUM: I have independently interviewed and examined the patient. I agree with the above.  No significant changes in TBAD on repeat imaging. True lumen more visible with BP control than previous No abdominal pain Will continue to treat conservatively with anti-impulse control. No plans for operative intervention at this time. Will continue to follow Plan to initiate dialysis today.   Fara Olden, MD [facility] Phone Number: [phone] 01/31/2023 12:06 PM

## 2023-01-31 NOTE — TOC Initial Note (Signed)
Transition of Care [facility]) - Initial/Assessment Note    Patient Details  Name: [name] MRN: [id number] Date of Birth: 18-Dec-1965  Transition of Care [facility]) CM/SW Contact:    Elliot Cousin, RN Phone Number: [phone] 01/31/2023, 6:43 PM  Clinical Narrative:                 CM spoke to pt and mother at bedside. Pt lives in the home with dtr. States she was independent PTA. Will continue to follow for dc needs.   Expected Discharge Plan: Home w Home Health Services Barriers to Discharge: Continued Medical Work up   Patient Goals and CMS Choice Patient states their goals for this hospitalization and ongoing recovery are:: wants to get better          Expected Discharge Plan and Services   Discharge Planning Services: CM Consult         Prior Living Arrangements/Services   Lives with:: Adult Children Patient language and need for interpreter reviewed:: Yes        Need for Family Participation in Patient Care: Yes (Comment) Care giver support system in place?: Yes (comment)   Criminal Activity/Legal Involvement Pertinent to Current Situation/Hospitalization: No - Comment as needed  Activities of Daily Living      Permission Sought/Granted Permission sought to share information with : Case Manager, Family Supports, PCP Permission granted to share information with : Yes, Verbal Permission Granted  Share Information with NAME: Shreshta Medley     Permission granted to share info w Relationship: daughter  Permission granted to share info w Contact Information: [phone]  Emotional Assessment Appearance:: Appears stated age Attitude/Demeanor/Rapport: Engaged Affect (typically observed): Accepting Orientation: : Oriented to Self, Oriented to Situation, Oriented to Place   Psych Involvement: No (comment)  Admission diagnosis:  Aortic dissection [I71.00] Dissecting abdominal aortic aneurysm [I71.02] AKI (acute kidney injury)  [N17.9] Hypertensive emergency [I16.1] Patient Active Problem List   Diagnosis Date Noted   Aortic dissection 01/29/2023   Bilateral low back pain without sciatica 08/29/2022   Nausea 08/29/2022   Chronic pain of both knees 07/29/2022   Pruritus 11/26/2021   Chronic illness 10/27/2021   Lymphadenopathy 07/01/2021   Paresthesias 06/30/2021   Chronic pain of right hand 06/30/2021   Chronic low back pain 06/30/2021   COVID-19 long hauler 05/11/2021   Voice hoarseness 12/15/2020   Recurrent falls 11/05/2020   Impingement syndrome of left shoulder region 05/27/2020   Inclusion cyst 05/01/2020   Postoperative breast asymmetry 02/28/2020   Cognitive dysfunction 02/21/2020   Chronic gout 01/27/2020   Hematuria 12/10/2019   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   Dysphagia 07/23/2019   CKD (chronic kidney disease) stage 4, GFR 15-29 ml/min 07/12/2019   Normocytic anemia 07/12/2019   Preventative health care 09/10/2018   Port-A-Cath in place 07/12/2018   Family history of breast cancer    Family history of prostate cancer    Family history of lung cancer    Malignant neoplasm of lower-inner quadrant of right breast of female, estrogen receptor positive 05/10/2018   Thyroid cancer    Hyperlipidemia 04/26/2017   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   GAD (generalized anxiety disorder) 04/11/2017   Mass of left side of neck 11/24/2016   Laryngopharyngeal reflux (LPR) 11/24/2016   Upper airway cough syndrome 11/11/2016   Obesity (BMI 30-39.9) 10/14/2016   Allergic rhinitis 02/18/2016   Non-insulin dependent type 2 diabetes mellitus 06/23/2015   Renal calculi 10/29/2014   Recurrent  boils 06/11/2014   Hidradenitis suppurativa 01/30/2012   Postsurgical hypothyroidism 03/20/2011   Sarcoidosis 02/08/2007   Cigarette smoker 02/08/2007   HIV disease (HCC) 07/24/2006   Depression 07/24/2006   Essential hypertension 07/24/2006   Asthma 07/24/2006   PCP:  Doreene Nest, NP Pharmacy:   [facility] - [address] [place] [address] Phone: [phone] Fax: [phone]  [id number] - [place], [place] - [address] [place] [address] Phone: [phone] Fax: [phone]  [id number] - [place], [place] - [address] [place] [address] Phone: [phone] Fax: [phone]     Social Determinants of Health (SDOH) Social History: SDOH Screenings   Transportation Needs: No Transportation Needs (10/25/2018)  Depression (PHQ2-9): Low Risk  (06/09/2022)  Tobacco Use: Medium Risk (01/30/2023)   SDOH Interventions:     Readmission Risk Interventions     No data to display

## 2023-01-31 NOTE — Progress Notes (Signed)
Messaged Elink RN in regards to patients SBP being higher than goal of 120. Will give PRN hydralazine a few mins early and reassess.

## 2023-01-31 NOTE — Progress Notes (Signed)
eLink Physician-Brief Progress Note Patient Name: Dynisha Badua DOB: August 26, 1966 MRN: [id number]   Date of Service  01/31/2023  HPI/Events of Note  Patient with BP above parameter of SBP < 120 despite Cleviprex gtt and PRN Hydralazine.  eICU Interventions  Labetalol 5-10 mg iv Q 2 hours PRN SBP > 120 ordered.        Thomasene Lot Adalaide Jaskolski 01/31/2023, 11:26 PM

## 2023-01-31 NOTE — Progress Notes (Signed)
BP increased since HD. Increasing hydralazine, starting losartan tonight. Con't coreg, amlodipine.  Steffanie Dunn, DO 01/31/23 5:54 PM [facility]  For contact information, see Amion. If no response to pager, please call PCCM consult pager. After hours, 7PM- 7AM, please call Elink.

## 2023-01-31 NOTE — Progress Notes (Signed)
NAME:  [name], MRN:  [id number], DOB:  February 13, 1966, LOS: 2 ADMISSION DATE:  01/29/2023, CONSULTATION DATE:  01/29/23 REFERRING MD:  EDP, CHIEF COMPLAINT:  aortic dissection   History of Present Illness:  57 yo female with pmh t2dm, ckd4, hiv, sarcoidosis who prsented to [facility] with complaints of back and abdominal pain with nausea and non bloody vomiting. Her family at bedside endorse this has been ongoing for 2 days not. Family provides most history as pt is quite drowsy after pain medication. The state that pt had no other associated symptoms. She had been trying to treat herself for consitpation thinking that was the cause of her pain. No fevers/ chills. No diarrhea. No recent change in medications.   She presented with elevated BP. Systolics >200. She was started on esmolol and nitro gtts and vascular is coming to eval.   Pertinent  Medical History  T2dm Htn Hyperlipidemia Hiv Hypothyroidism ckd4  Significant Hospital Events: Including procedures, antibiotic start and stop dates in addition to other pertinent events   Admitted to hospital 4/14, on cardene and esmolol 4/15 TDC placement, uremic encephalopathy  Interim History / Subjective:  Remains confused, planning for HD this morning.   Objective   Blood pressure 134/60, pulse 68, temperature 98.4 F (36.9 C), temperature source Oral, resp. rate 18, height 5\' 5"  (1.651 m), weight 100.4 kg, last menstrual period 03/31/2014, SpO2 (!) 88 %.        Intake/Output Summary (Last 24 hours) at 01/31/2023 0839 Last data filed at 01/31/2023 0837 Gross per 24 hour  Intake 1545.6 ml  Output 0 ml  Net 1545.6 ml    Filed Weights   01/28/23 2347 01/29/23 0530 01/30/23 0500  Weight: 96.2 kg 96.2 kg 100.4 kg    Examination: General: chronically ill appearing woman lying in bed in NAD HENT: Page/AT, eyes anicteric Lungs: breathing comfortably on [place], CTAB Cardiovascular: S1S2, RRR Abdomen: obese, soft,  NT Extremities: mild edema, no cyanosis Neuro: arouses to stimulation, maintains wakefulness longer today. Able to follow commands in all extremities.  Derm: warm, dry, no diffuse rashes  CTA pending  Echo: LVEF 55-60%, mild concentric LVH, G2DD. Mildly dilated LA, mild to mod MR. Redemonstration of aortic dissection in abdominal aorta. IVC dilated with reduced respiratory variability.   Resolved Hospital Problem list     Assessment & Plan:  Type B aortic dissection from proximal descending thoracic aorta distal or L subclavian to iliacs, extends further into the left leg to bifurcation of superficial and profunda femoral arteries.  Hypertensive emergency -Con't cleviprex for goal SBP <120, HR <60. Previously weaned off esmolol.   -Con't amlodipine, Coreg, -Increase hydralazine to 50 mg every 8 hours -repeat CTA tomorrow pending- completed before dialysis -Appreciate vascular surgery's management.  AKI on CKD 4; baseline Cr 4.2 and rising over the past few months. High risk for needing HD soon.  -straight cath if making urine and retaining - Renally dose meds and avoid nephrotoxic meds - Starting dialysis today.  TDC placed 4/15. - Appreciate nephrology's management.  Intermittent hemodialysis for now, eventually planning for long-term peritoneal dialysis as an outpatient.  I reviewed this plan with the patient today which she confirmed had been discussed at outpatient visits.  DM2  -Semglee 5 units daily - Sliding scale insulin as needed - Goal blood glucose 140-180 -glipizide on hold  Nausea -zofran PRN  HIV; well controlled recently with undetectable VL and CD4 685 in 12/2022 -con't ART meds  Hypothyroidism -levothyroxine  Acute  metabolic encephalopathy due to renal failure -hold sedating meds; dilaudid PRN preferred for pain -HD today; appreciate nephrology's management  GERD -Continue PPI  No family at bedside rounds today.  Best Practice (right click and  "Reselect all SmartList Selections" daily)   Diet/type: Regular consistency (see orders)- renal DVT prophylaxis: SCD GI prophylaxis: PPI Lines: Dialysis Catheter Foley:  N/A Code Status:  full code Last date of multidisciplinary goals of care discussion [full code 01/29/23]  Labs   CBC: Recent Labs  Lab 01/28/23 2358 01/30/23 0355  WBC 14.2* 12.6*  HGB 8.7* 7.2*  HCT 25.7* 22.1*  MCV 97.0 101.8*  PLT 180 175     Basic Metabolic Panel: Recent Labs  Lab 01/28/23 2358 01/30/23 0355  NA 139 132*  K 3.7 4.4  CL 102 98  CO2 24 20*  GLUCOSE 156* 120*  BUN 54* 60*  CREATININE 5.99* 7.44*  CALCIUM 9.3 7.8*    GFR: Estimated Creatinine Clearance: 10.2 mL/min (A) (by C-G formula based on SCr of 7.44 mg/dL (H)). Recent Labs  Lab 01/28/23 2358 01/30/23 0355  WBC 14.2* 12.6*       Critical care time:        This patient is critically ill with multiple organ system failure which requires frequent high complexity decision making, assessment, support, evaluation, and titration of therapies. This was completed through the application of advanced monitoring technologies and extensive interpretation of multiple databases. During this encounter critical care time was devoted to patient care services described in this note for 34 minutes.  Steffanie Dunn, DO 01/31/23 10:30 AM [place] [facility]  For contact information, see Amion. If no response to pager, please call PCCM consult pager. After hours, 7PM- 7AM, please call Elink.

## 2023-01-31 NOTE — Progress Notes (Signed)
[facility] Progress Note    Assessment/ Plan:   CKD 5, now ESRD -followed by our office -starting HD due to uremic symptoms. S/p [facility] 4/15. IHD #1 4/16. HD #2 planned for tomorrow -CLIP for outpatient placement -she is interested in PD apparently down the road, will hold off on AVF/AVG. -Follow daily labs, strict I/O -Avoid nephrotoxic medications including NSAIDs and iodinated intravenous contrast exposure unless the latter is absolutely indicated.  Preferred narcotic agents for pain control are hydromorphone, fentanyl, and methadone. Morphine should not be used. Avoid Baclofen and avoid oral sodium phosphate and magnesium citrate based laxatives / bowel preps. Continue strict Input and Output monitoring. 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 Both kidneys are perfusing -repeat CT with contrast today. At this junction, utilizing contrast would be acceptable especially as she is ESRD now.  HTN -off esmolol and cleviprex now -per CCM  HIV -on HAART  DM -per primary service  Anthony Sar, MD [facility]  Subjective:   Patient seen and examined bedside. Remains confused. She denies any urine output. S/p RIJ TDC placement with IR 4/15   Objective:   BP 134/60   Pulse 68   Temp 98.4 F (36.9 C) (Oral)   Resp 18   Ht  (1.651 m)   Wt 100.4 kg   LMP 03/31/2014 (LMP Unknown)   SpO2 (!) 88%   BMI 36.83 kg/m   Intake/Output Summary (Last 24 hours) at 01/31/2023 0845 Last data filed at 01/31/2023 1610 Gross per 24 hour  Intake 1545.6 ml  Output 0 ml  Net 1545.6 ml   Weight change:   Physical Exam: Gen: NAD CVS: RRR Resp: CTA b/l Abd: soft, nt Ext: trace edema Neuro: awake Dialysis access: RIJ [facility]  Imaging: IR Fluoro Guide CV Line Right  Result Date: 01/30/2023 INDICATION: END-STAGE  RENAL DISEASE, ACUTE UREMIA EXAM: ULTRASOUND GUIDANCE FOR VASCULAR ACCESS RIGHT INTERNAL JUGULAR PERMANENT HEMODIALYSIS CATHETER Date:  01/30/2023 01/30/2023 4:09 pm Radiologist:  Judie Petit. Ruel Favors, MD Guidance:  Ultrasound and fluoroscopic FLUOROSCOPY: Fluoroscopy Time: 0 minutes 36 seconds (7 mGy). MEDICATIONS: 1% lidocaine local with epinephrine 2 g Ancef within 1 hour of the procedure ANESTHESIA/SEDATION: Versed 1.0 mg IV; Fentanyl 25 mcg IV; Moderate Sedation Time:  11 minute The patient was continuously monitored during the procedure by the interventional radiology nurse under my direct supervision. CONTRAST:  None. COMPLICATIONS: None immediate. PROCEDURE: Informed consent was obtained from the patient following explanation of the procedure, risks, benefits and alternatives. The patient understands, agrees and consents for the procedure. All questions were addressed. A time out was performed. Maximal barrier sterile technique utilized including caps, mask, sterile gowns, sterile gloves, large sterile drape, hand hygiene, and 2% chlorhexidine scrub. Under sterile conditions and local anesthesia, right internal jugular micropuncture venous access was performed with ultrasound. Images were obtained for documentation of the patent right internal jugular vein. A guide wire was inserted followed by a transitional dilator. Next, a 0.035 guidewire was advanced into the IVC with a 5-French catheter. Measurements were obtained from the right venotomy site to the proximal right atrium. In the right infraclavicular chest, a subcutaneous tunnel was created under sterile conditions and local anesthesia. 1% lidocaine with epinephrine was utilized for this. The 23 cm tip to cuff palindrome catheter was tunneled subcutaneously to the venotomy site and inserted into the SVC/RA junction through a  valved peel-away sheath. Position was confirmed with fluoroscopy. Images were obtained for documentation. Blood was aspirated from the  catheter followed by saline and heparin flushes. The appropriate volume and strength of heparin was instilled in each lumen. Caps were applied. The catheter was secured at the tunnel site with Gelfoam and a pursestring suture. The venotomy site was closed with subcuticular Vicryl suture. Dermabond was applied to the small right neck incision. A dry sterile dressing was applied. The catheter is ready for use. No immediate complications. IMPRESSION: Ultrasound and fluoroscopically guided right internal jugular tunneled hemodialysis catheter (23 cm tip to cuff palindrome catheter). Electronically Signed   By: Judie Petit.  Shick M.D.   On: 01/30/2023 16:16   IR US Guide Vasc Access Right  Result Date: 01/30/2023 INDICATION: END-STAGE RENAL DISEASE, ACUTE UREMIA EXAM: ULTRASOUND GUIDANCE FOR VASCULAR ACCESS RIGHT INTERNAL JUGULAR PERMANENT HEMODIALYSIS CATHETER Date:  01/30/2023 01/30/2023 4:09 pm Radiologist:  Judie Petit. Ruel Favors, MD Guidance:  Ultrasound and fluoroscopic FLUOROSCOPY: Fluoroscopy Time: 0 minutes 36 seconds (7 mGy). MEDICATIONS: 1% lidocaine local with epinephrine 2 g Ancef within 1 hour of the procedure ANESTHESIA/SEDATION: Versed 1.0 mg IV; Fentanyl 25 mcg IV; Moderate Sedation Time:  11 minute The patient was continuously monitored during the procedure by the interventional radiology nurse under my direct supervision. CONTRAST:  None. COMPLICATIONS: None immediate. PROCEDURE: Informed consent was obtained from the patient following explanation of the procedure, risks, benefits and alternatives. The patient understands, agrees and consents for the procedure. All questions were addressed. A time out was performed. Maximal barrier sterile technique utilized including caps, mask, sterile gowns, sterile gloves, large sterile drape, hand hygiene, and 2% chlorhexidine scrub. Under sterile conditions and local anesthesia, right internal jugular micropuncture venous access was performed with ultrasound. Images were  obtained for documentation of the patent right internal jugular vein. A guide wire was inserted followed by a transitional dilator. Next, a 0.035 guidewire was advanced into the IVC with a 5-French catheter. Measurements were obtained from the right venotomy site to the proximal right atrium. In the right infraclavicular chest, a subcutaneous tunnel was created under sterile conditions and local anesthesia. 1% lidocaine with epinephrine was utilized for this. The 23 cm tip to cuff palindrome catheter was tunneled subcutaneously to the venotomy site and inserted into the SVC/RA junction through a valved peel-away sheath. Position was confirmed with fluoroscopy. Images were obtained for documentation. Blood was aspirated from the catheter followed by saline and heparin flushes. The appropriate volume and strength of heparin was instilled in each lumen. Caps were applied. The catheter was secured at the tunnel site with Gelfoam and a pursestring suture. The venotomy site was closed with subcuticular Vicryl suture. Dermabond was applied to the small right neck incision. A dry sterile dressing was applied. The catheter is ready for use. No immediate complications. IMPRESSION: Ultrasound and fluoroscopically guided right internal jugular tunneled hemodialysis catheter (23 cm tip to cuff palindrome catheter). Electronically Signed   By: Judie Petit.  Shick M.D.   On: 01/30/2023 16:16   VAS US RENAL ARTERY DUPLEX  Result Date: 01/30/2023 ABDOMINAL VISCERAL Patient Name:  [name] [facility]  Date of Exam:   01/30/2023 Medical Rec #: [id number]              Accession #:    [id number] Date of Birth: [birth year]              Patient Gender: F Patient Age:   88 years Exam Location:  [facility] Procedure:  VAS US RENAL ARTERY DUPLEX Referring Phys: [id number] JOSHUA E ROBINS -------------------------------------------------------------------------------- Indications: Type B aortic dissection. High Risk Factors: Hypertension,  hyperlipidemia, Diabetes. Other Factors: Chronic kidney disease and poorly controlled hypertension. GERD. Limitations: Obesity, air/bowel gas and Technically difficult/limited study due to patient body habitus. Comparison Study: 01/29/23 CT Angio CHEST/ABD/PEL showed dissection throughout                   the aorta into the iliac arteries, origin of the celiac and                   SMA. Performing Technologist: Marilynne Halsted RDMS, RVT  Examination Guidelines: A complete evaluation includes B-mode imaging, spectral Doppler, color Doppler, and power Doppler as needed of all accessible portions of each vessel. Bilateral testing is considered an integral part of a complete examination. Limited examinations for reoccurring indications may be performed as noted.  Duplex Findings: +--------------------+--------+--------+------+----------+ Mesenteric          PSV cm/sEDV cm/sPlaque Comments  +--------------------+--------+--------+------+----------+ Aorta Prox             94                 dissection +--------------------+--------+--------+------+----------+ Aorta Mid                                 dissection +--------------------+--------+--------+------+----------+ Aorta Distal                              dissection +--------------------+--------+--------+------+----------+ Celiac Artery Origin  386                            +--------------------+--------+--------+------+----------+ SMA Proximal          313                            +--------------------+--------+--------+------+----------+    +------------------+--------+--------+-------------+ Right Renal ArteryPSV cm/sEDV cm/s   Comment    +------------------+--------+--------+-------------+ Origin                            No visualized +------------------+--------+--------+-------------+ Proximal             77      20                 +------------------+--------+--------+-------------+ Mid                   92      7                  +------------------+--------+--------+-------------+ Distal               89      9                  +------------------+--------+--------+-------------+ +-----------------+--------+--------+--------------+ Left Renal ArteryPSV cm/sEDV cm/s   Comment     +-----------------+--------+--------+--------------+ Origin                           not visualized +-----------------+--------+--------+--------------+ Proximal            75      8                   +-----------------+--------+--------+--------------+  Mid                 74      10                  +-----------------+--------+--------+--------------+ Distal              86      18                  +-----------------+--------+--------+--------------+  Technologist observations: Incidental finding - gallstone. +------------+--------+--------+----+-----------+--------+--------+----+ Right KidneyPSV cm/sEDV cm/sRI  Left KidneyPSV cm/sEDV cm/sRI   +------------+--------+--------+----+-----------+--------+--------+----+ Upper Pole  66      0       1.00Upper Pole 26      7       0.72 +------------+--------+--------+----+-----------+--------+--------+----+ Mid         65      0       1.        41      8       0.80 +------------+--------+--------+----+-----------+--------+--------+----+ Lower Pole  26      0       1.00Lower Pole                      +------------+--------+--------+----+-----------+--------+--------+----+ Hilar       37      10      0.73Hilar      36      11      0.70 +------------+--------+--------+----+-----------+--------+--------+----+ +------------------+-----+------------------+-----+ Right Kidney           Left Kidney             +------------------+-----+------------------+-----+ RAR                    RAR                     +------------------+-----+------------------+-----+ RAR (manual)      0.82 RAR (manual)      0.79   +------------------+-----+------------------+-----+ Cortex                 Cortex                  +------------------+-----+------------------+-----+ Cortex thickness       Corex thickness         +------------------+-----+------------------+-----+ Kidney length (cm)12.72Kidney length (cm)11.90 +------------------+-----+------------------+-----+  Summary: Renal:  Right: Patent right renal artery with high resistance waveforms        noted throughout without signifcant stenosis. Abnormal right        resisitive index with increased echogenicity within the renal        parenchyma. Left:  Patent left renal artery with high resistance waveforms noted        throughout without signifcant stenosis. Abnormal left        resisitive index with increased echogenicity within the renal        parenchyma. Mesenteric:  Aortic dissection was noted.  *See table(s) above for measurements and observations.  Diagnosing physician: Sherald Hess MD  Electronically signed by Sherald Hess MD on 01/30/2023 at 2:57:56 PM.    Final    ECHOCARDIOGRAM COMPLETE  Result Date: 01/30/2023    ECHOCARDIOGRAM REPORT   Patient Name:   KOREE STAHELI [facility] Date of Exam: 01/30/2023 Medical Rec #:  [id number]             Height:       65.0 in Accession #:    [id number]  Weight:       221.3 lb Date of Birth:  [birth year]             BSA:          2.065 m Patient Age:    56 years              BP:           110/53 mmHg Patient Gender: F                     HR:           68 bpm. Exam Location:  Inpatient Procedure: 2D Echo, Cardiac Doppler and Color Doppler Indications:    Dissection of abdominal aorta I71.02  History:        Patient has prior history of Echocardiogram examinations, most                 recent 02/20/2020. CKD 4 and Aortic Dissection; Risk                 Factors:Hypertension, Diabetes, Dyslipidemia and Former Smoker.  Sonographer:    Dondra Prader RVT RCS Referring Phys: [id number] SUDHAM CHAND IMPRESSIONS  1.  Left ventricular ejection fraction, by estimation, is 55 to 60%. The left ventricle has normal function. The left ventricle has no regional wall motion abnormalities. There is mild concentric left ventricular hypertrophy. Left ventricular diastolic parameters are consistent with Grade II diastolic dysfunction (pseudonormalization).  2. Right ventricular systolic function is normal. The right ventricular size is normal. Tricuspid regurgitation signal is inadequate for assessing PA pressure.  3. Left atrial size was mildly dilated.  4. The mitral valve is normal in structure. Mild to moderate mitral valve regurgitation. No evidence of mitral stenosis.  5. The aortic valve is calcified. Aortic valve regurgitation is not visualized. Aortic valve sclerosis/calcification is present, without any evidence of aortic stenosis.  6. There appears a flap in the abdominal aorta concerning for abdominal aortic dissection - would recommend further testing with Abdominal CTA or MRA.  7. The inferior vena cava is dilated in size with <50% respiratory variability, suggesting right atrial pressure of 15 mmHg. FINDINGS  Left Ventricle: Left ventricular ejection fraction, by estimation, is 55 to 60%. The left ventricle has normal function. The left ventricle has no regional wall motion abnormalities. The left ventricular internal cavity size was normal in size. There is  mild concentric left ventricular hypertrophy. Left ventricular diastolic parameters are consistent with Grade II diastolic dysfunction (pseudonormalization). Right Ventricle: The right ventricular size is normal. No increase in right ventricular wall thickness. Right ventricular systolic function is normal. Tricuspid regurgitation signal is inadequate for assessing PA pressure. Left Atrium: Left atrial size was mildly dilated. Right Atrium: Right atrial size was normal in size. Pericardium: There is no evidence of pericardial effusion. Mitral Valve: The mitral valve is  normal in structure. Mild to moderate mitral valve regurgitation. No evidence of mitral valve stenosis. Tricuspid Valve: The tricuspid valve is normal in structure. Tricuspid valve regurgitation is mild . No evidence of tricuspid stenosis. Aortic Valve: The aortic valve is calcified. Aortic valve regurgitation is not visualized. Aortic valve sclerosis/calcification is present, without any evidence of aortic stenosis. Aortic valve mean gradient measures 6.5 mmHg. Aortic valve peak gradient measures 11.9 mmHg. Aortic valve area, by VTI measures 2.36 cm. Pulmonic Valve: The pulmonic valve was normal in structure. Pulmonic valve regurgitation is trivial. No evidence of pulmonic stenosis. Aorta: There appears a flap  in the abdominal aorta concerning for abdominal aortic dissection - would recommend further testing with Abdominal CTA or MRA. Venous: The inferior vena cava is dilated in size with less than 50% respiratory variability, suggesting right atrial pressure of 15 mmHg. IAS/Shunts: No atrial level shunt detected by color flow Doppler.  LEFT VENTRICLE PLAX 2D LVIDd:         5.40 cm   Diastology LVIDs:         3.50 cm   LV e' medial:    5.33 cm/s LV PW:         1.30 cm   LV E/e' medial:  22.5 LV IVS:        1.30 cm   LV e' lateral:   6.00 cm/s LVOT diam:     2.00 cm   LV E/e' lateral: 20.0 LV SV:         86 LV SV Index:   42 LVOT Area:     3.14 cm  RIGHT VENTRICLE             IVC RV Basal diam:  3.60 cm     IVC diam: 2.70 cm RV S prime:     13.80 cm/s TAPSE (M-mode): 2.5 cm LEFT ATRIUM              Index        RIGHT ATRIUM           Index LA diam:        4.40 cm  2.13 cm/m   RA Area:     16.30 cm LA Vol (A2C):   104.0 ml 50.36 ml/m  RA Volume:   45.00 ml  21.79 ml/m LA Vol (A4C):   60.3 ml  29.20 ml/m LA Biplane Vol: 81.9 ml  39.66 ml/m  AORTIC VALVE                     PULMONIC VALVE AV Area (Vmax):    2.46 cm      PV Vmax:       1.10 m/s AV Area (Vmean):   2.37 cm      PV Peak grad:  4.9 mmHg AV Area  (VTI):     2.36 cm AV Vmax:           172.50 cm/s AV Vmean:          113.000 cm/s AV VTI:            0.366 m AV Peak Grad:      11.9 mmHg AV Mean Grad:      6.5 mmHg LVOT Vmax:         135.00 cm/s LVOT Vmean:        85.300 cm/s LVOT VTI:          0.275 m LVOT/AV VTI ratio: 0.75  AORTA Ao Root diam: 2.80 cm Ao Asc diam:  3.30 cm Ao Arch diam: 2.9 cm MITRAL VALVE MV Area (PHT): 2.62 cm     SHUNTS MV Decel Time: 289 msec     Systemic VTI:  0.28 m MV E velocity: 120.00 cm/s  Systemic Diam: 2.00 cm MV A velocity: 104.00 cm/s MV E/A ratio:  1.15 Kardie Tobb DO Electronically signed by Thomasene Ripple DO Signature Date/Time: 01/30/2023/2:00:43 PM    Final     Labs: BMET Recent Labs  Lab 01/28/23 2358 01/30/23 0355  NA 139 132*  K 3.7 4.4  CL 102 98  CO2 24 20*  GLUCOSE  156* 120*  BUN 54* 60*  CREATININE 5.99* 7.44*  CALCIUM 9.3 7.8*   CBC Recent Labs  Lab 01/28/23 2358 01/30/23 0355  WBC 14.2* 12.6*  HGB 8.7* 7.2*  HCT 25.7* 22.1*  MCV 97.0 101.8*  PLT 180 175    Medications:     amLODipine  10 mg Oral Daily   carvedilol  25 mg Oral BID WC   Chlorhexidine Gluconate Cloth  6 each Topical Daily   Chlorhexidine Gluconate Cloth  6 each Topical Q0600   dolutegravir  50 mg Oral Daily   emtricitabine-tenofovir AF  1 tablet Oral Daily   hydrALAZINE  50 mg Oral Q8H   insulin aspart  0-15 Units Subcutaneous Q4H   levothyroxine  175 mcg Oral Q0600   pantoprazole  40 mg Oral Daily   rosuvastatin  10 mg Oral Daily   sertraline  50 mg Oral Daily      Anthony Sar, MD [facility] 01/31/2023, 8:45 AM

## 2023-01-31 NOTE — Progress Notes (Signed)
Reviewed and agree with documentation and assessment and plan. K. Veena Torina Ey , MD   

## 2023-01-31 NOTE — Progress Notes (Signed)
Discussed CT findings with Radiology-- unclear significance, possibly developing an ileus. Will check LA and proactively put her on clear liquid diet overnight while we watch her. D/w Vascular surgery, who is aware and thinks this is likely sequela of her dissection and possible transient ischemia she had when this began. Unless abdominal exam worsens, no surgical intervention needed at this point. RN updated about change to clear liquid diet. Still ok for meds.  Steffanie Dunn, DO 01/31/23 6:14 PM [facility]

## 2023-01-31 NOTE — Progress Notes (Signed)
Requested to see pt for out-pt HD needs at d/c. Due to documentation from physicians regarding pt's confusion, attempted to reach pt's daughter via phone to discuss out-pt HD needs. Message left for daughter requesting a return call. Will await return call from daughter or attempt to speak with pt once pt is more clear. Will assist as needed.   Olivia Canter Renal Navigator [phone]

## 2023-02-01 ENCOUNTER — Inpatient Hospital Stay (HOSPITAL_COMMUNITY): Payer: Medicare Other

## 2023-02-01 DIAGNOSIS — N179 Acute kidney failure, unspecified: Secondary | ICD-10-CM | POA: Diagnosis not present

## 2023-02-01 DIAGNOSIS — I161 Hypertensive emergency: Secondary | ICD-10-CM | POA: Diagnosis not present

## 2023-02-01 DIAGNOSIS — G9340 Encephalopathy, unspecified: Secondary | ICD-10-CM

## 2023-02-01 LAB — GLUCOSE, CAPILLARY
Glucose-Capillary: 130 mg/dL — ABNORMAL HIGH (ref 70–99)
Glucose-Capillary: 139 mg/dL — ABNORMAL HIGH (ref 70–99)
Glucose-Capillary: 147 mg/dL — ABNORMAL HIGH (ref 70–99)
Glucose-Capillary: 149 mg/dL — ABNORMAL HIGH (ref 70–99)
Glucose-Capillary: 163 mg/dL — ABNORMAL HIGH (ref 70–99)
Glucose-Capillary: 172 mg/dL — ABNORMAL HIGH (ref 70–99)

## 2023-02-01 LAB — RENAL FUNCTION PANEL
Albumin: 2.5 g/dL — ABNORMAL LOW (ref 3.5–5.0)
Anion gap: 18 — ABNORMAL HIGH (ref 5–15)
BUN: 58 mg/dL — ABNORMAL HIGH (ref 6–20)
CO2: 17 mmol/L — ABNORMAL LOW (ref 22–32)
Calcium: 7.4 mg/dL — ABNORMAL LOW (ref 8.9–10.3)
Chloride: 94 mmol/L — ABNORMAL LOW (ref 98–111)
Creatinine, Ser: 8.7 mg/dL — ABNORMAL HIGH (ref 0.44–1.00)
GFR, Estimated: 5 mL/min — ABNORMAL LOW (ref >60)
Glucose, Bld: 150 mg/dL — ABNORMAL HIGH (ref 70–99)
Phosphorus: 9 mg/dL — ABNORMAL HIGH (ref 2.5–4.6)
Potassium: 4.4 mmol/L (ref 3.5–5.1)
Sodium: 129 mmol/L — ABNORMAL LOW (ref 135–145)

## 2023-02-01 LAB — TRIGLYCERIDES: Triglycerides: 684 mg/dL — ABNORMAL HIGH (ref <150)

## 2023-02-01 MED ORDER — PROSOURCE TF20 ENFIT COMPATIBL EN LIQD
60.0000 mL | Freq: Every day | ENTERAL | Status: DC
  Administered 2023-02-01 – 2023-02-06 (×4): 60 mL
  Filled 2023-02-01 (×5): qty 60

## 2023-02-01 MED ORDER — DEXMEDETOMIDINE HCL IN NACL 400 MCG/100ML IV SOLN
0.0000 ug/kg/h | INTRAVENOUS | Status: DC
  Administered 2023-02-01: 0.4 ug/kg/h via INTRAVENOUS
  Filled 2023-02-01 (×2): qty 100

## 2023-02-01 MED ORDER — AMLODIPINE BESYLATE 10 MG PO TABS
10.0000 mg | ORAL_TABLET | Freq: Every day | ORAL | Status: DC
  Administered 2023-02-02 – 2023-02-05 (×3): 10 mg
  Filled 2023-02-01 (×4): qty 1

## 2023-02-01 MED ORDER — ESMOLOL HCL-SODIUM CHLORIDE 2000 MG/100ML IV SOLN
25.0000 ug/kg/min | INTRAVENOUS | Status: DC
  Administered 2023-02-01: 25 ug/kg/min via INTRAVENOUS
  Administered 2023-02-01: 100 ug/kg/min via INTRAVENOUS
  Administered 2023-02-01: 25 ug/kg/min via INTRAVENOUS
  Filled 2023-02-01 (×2): qty 100

## 2023-02-01 MED ORDER — HYDRALAZINE HCL 20 MG/ML IJ SOLN: 10.0000 mg | Freq: Once | INTRAMUSCULAR | Status: AC

## 2023-02-01 MED ORDER — POLYETHYLENE GLYCOL 3350 17 G PO PACK: 17.0000 g | PACK | Freq: Every day | ORAL | Status: DC | PRN

## 2023-02-01 MED ORDER — LOSARTAN POTASSIUM 50 MG PO TABS
100.0000 mg | ORAL_TABLET | Freq: Every day | ORAL | Status: DC
  Administered 2023-02-02 – 2023-02-07 (×4): 100 mg
  Filled 2023-02-01 (×4): qty 2

## 2023-02-01 MED ORDER — SERTRALINE HCL 50 MG PO TABS
50.0000 mg | ORAL_TABLET | Freq: Every day | ORAL | Status: DC
  Administered 2023-02-02 – 2023-02-17 (×12): 50 mg
  Filled 2023-02-01 (×17): qty 1

## 2023-02-01 MED ORDER — VITAL HIGH PROTEIN PO LIQD: 1000.0000 mL | ORAL | Status: DC

## 2023-02-01 MED ORDER — RENA-VITE PO TABS
1.0000 | ORAL_TABLET | Freq: Every day | ORAL | Status: DC
  Administered 2023-02-01 – 2023-02-07 (×7): 1
  Filled 2023-02-01 (×7): qty 1

## 2023-02-01 MED ORDER — DOLUTEGRAVIR SODIUM 50 MG PO TABS
50.0000 mg | ORAL_TABLET | Freq: Every day | ORAL | Status: DC
  Administered 2023-02-02 – 2023-02-17 (×16): 50 mg
  Filled 2023-02-01 (×17): qty 1

## 2023-02-01 MED ORDER — VITAL 1.5 CAL PO LIQD
1000.0000 mL | ORAL | Status: DC
  Administered 2023-02-01 – 2023-02-02 (×2): 1000 mL

## 2023-02-01 MED ORDER — CARVEDILOL 25 MG PO TABS
25.0000 mg | ORAL_TABLET | Freq: Two times a day (BID) | ORAL | Status: DC
  Administered 2023-02-01 – 2023-02-06 (×9): 25 mg
  Filled 2023-02-01 (×9): qty 1

## 2023-02-01 MED ORDER — ROSUVASTATIN CALCIUM 5 MG PO TABS
10.0000 mg | ORAL_TABLET | Freq: Every day | ORAL | Status: DC
  Administered 2023-02-02 – 2023-02-17 (×15): 10 mg
  Filled 2023-02-01 (×17): qty 2

## 2023-02-01 MED ORDER — HYDRALAZINE HCL 50 MG PO TABS
100.0000 mg | ORAL_TABLET | Freq: Three times a day (TID) | ORAL | Status: DC
  Administered 2023-02-02 – 2023-02-06 (×11): 100 mg
  Filled 2023-02-01 (×12): qty 2

## 2023-02-01 MED ORDER — LORAZEPAM 2 MG/ML IJ SOLN
1.0000 mg | Freq: Once | INTRAMUSCULAR | Status: AC | PRN
  Administered 2023-02-01: 1 mg via INTRAVENOUS
  Filled 2023-02-01: qty 1

## 2023-02-01 MED ORDER — LEVOTHYROXINE SODIUM 75 MCG PO TABS
175.0000 ug | ORAL_TABLET | Freq: Every day | ORAL | Status: DC
  Administered 2023-02-02 – 2023-02-18 (×17): 175 ug
  Filled 2023-02-01 (×19): qty 1

## 2023-02-01 MED ORDER — EMTRICITABINE-TENOFOVIR AF 200-25 MG PO TABS
1.0000 | ORAL_TABLET | Freq: Every day | ORAL | Status: DC
  Administered 2023-02-02 – 2023-02-17 (×16): 1
  Filled 2023-02-01 (×17): qty 1

## 2023-02-01 MED ORDER — LOSARTAN POTASSIUM 50 MG PO TABS
100.0000 mg | ORAL_TABLET | Freq: Every day | ORAL | Status: DC
  Administered 2023-02-01: 100 mg via ORAL
  Filled 2023-02-01: qty 2

## 2023-02-01 NOTE — Progress Notes (Addendum)
  Progress Note    02/01/2023 7:50 AM * No surgery found *  Subjective:  no complaints this morning, still confused    Vitals:   02/01/23 0740 02/01/23 0742  BP: (!) 140/56 (!) 141/59  Pulse: 65 71  Resp: 15 16  Temp:    SpO2: 94% 95%    Physical Exam: General:  no acute distress Cardiac:  regular, SBPs 120s-140s Lungs:  nonlabored Extremities:  palpable DP and radial pulses bilaterally Abdomen: soft, nontender to palpation  CBC    Component Value Date/Time   WBC 12.6 (H) 01/30/2023 0355   RBC 2.17 (L) 01/30/2023 0355   HGB 7.2 (L) 01/30/2023 0355   HGB 11.7 (L) 07/27/2021 1110   HCT 22.1 (L) 01/30/2023 0355   PLT 175 01/30/2023 0355   PLT 228 07/27/2021 1110   MCV 101.8 (H) 01/30/2023 0355   MCH 33.2 01/30/2023 0355   MCHC 32.6 01/30/2023 0355   RDW 16.9 (H) 01/30/2023 0355   LYMPHSABS 1.6 12/19/2022 1242   MONOABS 0.7 12/19/2022 1242   EOSABS 0.2 12/19/2022 1242   BASOSABS 0.0 12/19/2022 1242    BMET    Component Value Date/Time   NA 132 (L) 01/30/2023 0355   NA 142 12/05/2022 0000   K 4.4 01/30/2023 0355   CL 98 01/30/2023 0355   CO2 20 (L) 01/30/2023 0355   GLUCOSE 120 (H) 01/30/2023 0355   BUN 60 (H) 01/30/2023 0355   BUN 44 (A) 12/05/2022 0000   CREATININE 7.44 (H) 01/30/2023 0355   CREATININE 2.72 (H) 07/27/2021 1110   CREATININE 2.74 (H) 03/17/2021 1050   CALCIUM 7.8 (L) 01/30/2023 0355   GFRNONAA 6 (L) 01/30/2023 0355   GFRNONAA 20 (L) 07/27/2021 1110   GFRAA 39 (L) 07/14/2020 1053   GFRAA 33 (L) 08/12/2019 1447    INR    Component Value Date/Time   INR 1.0 07/14/2019 0312     Intake/Output Summary (Last 24 hours) at 02/01/2023 0750 Last data filed at 02/01/2023 0500 Gross per 24 hour  Intake 879.59 ml  Output 500 ml  Net 379.59 ml      Assessment/Plan:  57 y.o. 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 Abdomen is nontender to palpation -Upper and lower extremities well perfused  with palpable radial and DP pulses -Continue impulse control for type B aortic Tate. SBPs 120s-140s. Currently on max dose of cleviprex with amlodipine, carvedilol, and hydralazine -Still fairly confused and likely still uremic. Labs pending this morning. Plans for another dialysis session today   Loel Dubonnet, [facility] [phone] 02/01/2023 7:50 AM  VASCULAR STAFF ADDENDUM: I have independently interviewed and examined the patient. I agree with the above.  Still with some confusion - likely uremia No abdominal pain  Physical exam unchanged from yesterday.  Appreciate multidisciplinary care    Fara Olden, MD [facility] Phone Number: [phone] 02/01/2023 8:50 AM

## 2023-02-01 NOTE — Procedures (Signed)
I was present at this dialysis session. I have reviewed the session itself and made appropriate changes.   Filed Weights   01/31/23 1756 02/01/23 0500 02/01/23 0727  Weight: 100 kg 98.6 kg 100.1 kg    Recent Labs  Lab 02/01/23 0633  NA 129*  K 4.4  CL 94*  CO2 17*  GLUCOSE 150*  BUN 58*  CREATININE 8.70*  CALCIUM 7.4*  PHOS 9.0*    Recent Labs  Lab 01/28/23 2358 01/30/23 0355  WBC 14.2* 12.6*  HGB 8.7* 7.2*  HCT 25.7* 22.1*  MCV 97.0 101.8*  PLT 180 175    Scheduled Meds:  amLODipine  10 mg Oral Daily   carvedilol  25 mg Oral BID   Chlorhexidine Gluconate Cloth  6 each Topical Daily   Chlorhexidine Gluconate Cloth  6 each Topical Q0600   dolutegravir  50 mg Oral Daily   emtricitabine-tenofovir AF  1 tablet Oral Daily   hydrALAZINE  100 mg Oral Q8H   insulin aspart  0-15 Units Subcutaneous Q4H   levothyroxine  175 mcg Oral Q0600   losartan  100 mg Oral Daily   pantoprazole  40 mg Oral Daily   rosuvastatin  10 mg Oral Daily   sertraline  50 mg Oral Daily   Continuous Infusions:  clevidipine 21 mg/hr (02/01/23 0500)   esmolol     PRN Meds:.docusate sodium, hydrALAZINE, HYDROmorphone (DILAUDID) injection, labetalol, ondansetron (ZOFRAN) IV, polyethylene glycol   Anthony Sar, MD [facility] 02/01/2023, 8:58 AM

## 2023-02-01 NOTE — Progress Notes (Signed)
Initial Nutrition Assessment  DOCUMENTATION CODES:   Not applicable  INTERVENTION:   Tube Feeding via Cortrak: Initiate Vital 1.5 at 20 ml/hr (Trickles only today) TF goal regimen is Vital 1.5 at 50 ml/hr which provides 81 g of protein, 1800 kcals, 912 mL of free water  Renal MVI daily  Currently receiving additional calories via Cleviprex   NUTRITION DIAGNOSIS:   Inadequate oral intake related to acute illness, lethargy/confusion as evidenced by NPO status.  GOAL:   Patient will meet greater than or equal to 90% of their needs  MONITOR:   Diet advancement, TF tolerance, Labs, Weight trends  REASON FOR ASSESSMENT:   Rounds    ASSESSMENT:   57 yo female admitted with Type B aortic dissection, AKI on CKD 4-progressed to ESRD and iHD initiated. PMH DM, CKD 4, HIV, sarcoidosis, HTN, HLD  4/15 TDW placed 4/16 1st iHD 4/17 2nd iHD, Cortrak placed  Pt alert but remains confused. Max dose of Cleviprex MRI brain today  Cortrak placed today and tip in stomach Currently on CL, refusing meds, po.   Recorded po intake 85-100% on 4/14. No recorded po intake since  Noted iPTH pending  No BM since admission  Current wt 99 kg, wt on admission 96.2 kg  Labs: sodium 129, BUN 58, Creatinine 8.70, phosphorus 9.0 (H) Meds: ss novolog, protonix   Diet Order             Diet clear liquid Room service appropriate? Yes; Fluid consistency: Thin  Diet effective now                   EDUCATION NEEDS:   Not appropriate for education at this time  Skin:  Skin Assessment: Reviewed RN Assessment  Last BM:  PTA  Height:   Ht Readings from Last 1 Encounters:  01/28/23 5\' 5"  (1.651 m)    Weight:   Wt Readings from Last 1 Encounters:  02/01/23 99 kg     BMI:  Body mass index is 36.32 kg/m.  Estimated Nutritional Needs:   Kcal:  1750-1950 kcals  Protein:  75-85 g  Fluid:  1L plus UOP   Romelle Starcher MS, RDN, LDN, CNSC Registered Dietitian 3 Clinical  Nutrition RD Pager and On-Call Pager Number Located in [place]

## 2023-02-01 NOTE — Progress Notes (Signed)
Providers documenting continued confusion. Attempted to reach pt's daughter via phone. Message left requesting a return call. Will continue efforts to reach pt's daughter to discuss out-pt HD needs at d/c.  Olivia Canter Renal Navigator [phone]
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
## 2023-02-01 NOTE — Progress Notes (Signed)
Received patient in bed.  Alert and oriented.  Informed consent signed and in chart.   TX duration: 2.5  Patient tolerated well.  Alert, without acute distress.  Hand-off given to patient's nurse.   Access used: R IJ Access issues: None  Total UF removed: Medication(s) given: None  Post HD weight: 99kg   02/01/23 1015  Vitals  Temp 98 F (36.7 C)  Temp Source Oral  BP 124/62  MAP (mmHg) 80  BP Location Left Wrist  BP Method Automatic  Patient Position (if appropriate) Lying  Pulse Rate 68  Pulse Rate Source Monitor  ECG Heart Rate 67  Resp 13  Oxygen Therapy  SpO2 95 %  O2 Device Nasal Cannula  O2 Flow Rate (L/min) 4 L/min  Patient Activity (if Appropriate) In bed  Pulse Oximetry Type Continuous  During Treatment Monitoring  Intra-Hemodialysis Comments Tx completed  Post Treatment  Dialyzer Clearance Lightly streaked  Duration of HD Treatment -hour(s) 2.5 hour(s)  Liters Processed 37.5  Fluid Removed (mL) 1000 mL  Tolerated HD Treatment Yes  Hemodialysis Catheter Right Internal jugular Double lumen Permanent (Tunneled)  Placement Date/Time: 01/30/23 1606   Serial / Lot #: [id number]  Expiration Date: 01/09/27  Time Out: Correct patient;Correct site;Correct procedure  Maximum sterile barrier precautions: Hand hygiene;Cap;Mask;Sterile gown;Sterile gloves;Large sterile ...  Site Condition No complications  Blue Lumen Status Dead end cap in place;Heparin locked  Red Lumen Status Dead end cap in place;Heparin locked  Purple Lumen Status N/A  Catheter fill solution Heparin 1000 units/ml  Catheter fill volume (Arterial) 1.9 cc  Catheter fill volume (Venous) 1.9  Dressing Type Transparent  Dressing Status Antimicrobial disc in place;Clean, Dry, Intact  Interventions Other (Comment)  Drainage Description None  Dressing Change Due 02/06/23  Post treatment catheter status Capped and Clamped     Lakyla Biswas [facility]

## 2023-02-01 NOTE — Progress Notes (Signed)
NAME:  Shauntice Zampino, MRN:  [id number], DOB:  Feb 15, 1966, LOS: 3 ADMISSION DATE:  01/29/2023, CONSULTATION DATE:  01/29/23 REFERRING MD:  EDP, CHIEF COMPLAINT:  aortic dissection   History of Present Illness:  57 yo female with pmh t2dm, ckd4, hiv, sarcoidosis who prsented to [facility] with complaints of back and abdominal pain with nausea and non bloody vomiting. Her family at bedside endorse this has been ongoing for 2 days not. Family provides most history as pt is quite drowsy after pain medication. The state that pt had no other associated symptoms. She had been trying to treat herself for consitpation thinking that was the cause of her pain. No fevers/ chills. No diarrhea. No recent change in medications.   She presented with elevated BP. Systolics >200. She was started on esmolol and nitro gtts and vascular is coming to eval.   Pertinent  Medical History  T2dm Htn Hyperlipidemia Hiv Hypothyroidism ckd4  Significant Hospital Events: Including procedures, antibiotic start and stop dates in addition to other pertinent events   Admitted to hospital 4/14, on cardene and esmolol 4/15 TDC placement, uremic encephalopathy  Interim History / Subjective:  Remains confused per her sister. She has been answering some questions but seems delayed and has been refusing to eat and drink. This afternoon refused her pills. Her sister thinks she is somewhat depressed because she is now needing dialysis. Second HD this morning.  Objective   Blood pressure (!) 143/64, pulse 67, temperature 97.8 F (36.6 C), temperature source Axillary, resp. rate 16, height 5\' 5"  (1.651 m), weight 99 kg, last menstrual period 03/31/2014, SpO2 93 %.        Intake/Output Summary (Last 24 hours) at 02/01/2023 1643 Last data filed at 02/01/2023 1325 Gross per 24 hour  Intake 901.58 ml  Output 1500 ml  Net -598.42 ml    Filed Weights   02/01/23 0500 02/01/23 0727 02/01/23 1020  Weight: 98.6 kg 100.1 kg  99 kg    Examination: General: ill appearing woman lying in bed in NAD HENT: [place]/AT, eyes anicteric Lungs: breathing comfortably on  Cardiovascular: S1S2, RRR Abdomen: obese, soft, NT Extremities: no significant peripheral edema, no cyanosis Neuro: awake but not answering questions quickly- sometimes answering questions after the next one is asked. Seems to be giving reasonably accurate answers. Moving extremities, but very sluggish to move her arms. Still able to lift them both overhead, moves legs more briskly. Derm: warm, dry, no diffuse rashes  Labs reviewed. Na+ 1239 Bicarb 17 BUN 58 Cr 8.7 TG 684  Resolved Hospital Problem list     Assessment & Plan:  Type B aortic dissection from proximal descending thoracic aorta distal or L subclavian to iliacs, extends further into the left leg to bifurcation of superficial and profunda femoral arteries.  Hypertensive emergency -Con't cleviprex for goal SBP <120, HR <60. Previously weaned off esmolol.   -Con't amlodipine, Coreg, -Increase hydralazine to 50 mg every 8 hours -repeat CTA tomorrow pending- completed before dialysis -Appreciate vascular surgery's management.  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 -resume esmolol -con't losartan, coreg, amlodipine -increase hydralazine to 100mg  Q8h  Acute encephalopathy, concerned this may not be all uremia. I worry she could have watershed infarcts from hypoperfusion from rapid drop in BP. -Discussed my concerns with her sister at bedside; needs an MRI brain to confirm. Unfortunately she still needs strict BP control for her aorta  AKI on CKD 4;  baseline Cr 4.2 and rising over the past few months. Likely ESRD. -iHD per nephrology -renal diet -renally dose meds, avoid nephrotoxic meds -TDC placed 4/15; will be discharged with this line but eventually will transition from HD to PD as  OP.  DM2  -SSI PRN -goal BG 140-180 -hold PTA glipizide -semglee held  Nausea -zofran PRN  Poor PO intake -cortrak, start TF  HIV; well controlled recently with undetectable VL and CD4 685 in 12/2022 -con't ART  Hypothyroidism -levothyroxine  GERD -PPI  Sister updated at bedside. She is trying to motivate her to eat and take meds. She agrees that her sister is having variable mental status but is not back to her baseline yet. She understands my concern that despite a second day of HD, she seems worse than yesterday, which raises my concern for BP-related brain perfusion as a cause. She agrees with MRI.  Best Practice (right click and "Reselect all SmartList Selections" daily)   Diet/type: Regular consistency (see orders)- as able; needing TF for poor PO inatke DVT prophylaxis: SCD GI prophylaxis: PPI Lines: Dialysis Catheter Foley:  N/A Code Status:  full code Last date of multidisciplinary goals of care discussion [sister updated 4/16]  Labs   CBC: Recent Labs  Lab 01/28/23 2358 01/30/23 0355  WBC 14.2* 12.6*  HGB 8.7* 7.2*  HCT 25.7* 22.1*  MCV 97.0 101.8*  PLT 180 175     Basic Metabolic Panel: Recent Labs  Lab 01/28/23 2358 01/30/23 0355 02/01/23 0633  NA 139 132* 129*  K 3.7 4.4 4.4  CL 102 98 94*  CO2 24 20* 17*  GLUCOSE 156* 120* 150*  BUN 54* 60* 58*  CREATININE 5.99* 7.44* 8.70*  CALCIUM 9.3 7.8* 7.4*  PHOS  --   --  9.0*    GFR: Estimated Creatinine Clearance: 8.6 mL/min (A) (by C-G formula based on SCr of 8.7 mg/dL (H)). Recent Labs  Lab 01/28/23 2358 01/30/23 0355 01/31/23 1849  WBC 14.2* 12.6*  --   LATICACIDVEN  --   --  0.9       Critical care time:        This patient is critically ill with multiple organ system failure which requires frequent high complexity decision making, assessment, support, evaluation, and titration of therapies. This was completed through the application of advanced monitoring technologies and  extensive interpretation of multiple databases. During this encounter critical care time was devoted to patient care services described in this note for 45 minutes.  Steffanie Dunn, DO 02/01/23 5:03 PM [facility]  For contact information, see Amion. If no response to pager, please call PCCM consult pager. After hours, 7PM- 7AM, please call [place].

## 2023-02-01 NOTE — Progress Notes (Signed)
[facility] Progress Note    Assessment/ Plan:   CKD 5, now ESRD -followed by our office as an outpatient -started HD due to uremic symptoms. S/p [facility] 4/15. IHD #1 4/16. HD #2 planned for today. Next treatment tentatively planned for Friday -CLIP underway for outpatient placement -she is interested in PD apparently down the road, will hold off on AVF/AVG for now. -Follow daily labs, strict I/O -Avoid nephrotoxic medications including NSAIDs and iodinated intravenous contrast exposure unless the latter is absolutely indicated.  Preferred narcotic agents for pain control are hydromorphone, fentanyl, and methadone. Morphine should not be used. Avoid Baclofen and avoid oral sodium phosphate and magnesium citrate based laxatives / bowel preps. Continue strict Input and Output monitoring. 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 Both kidneys are perfusing -BP control as below  HTN -off esmolol, on cleviprex now -per CCM -will UF as tolerated  HIV -on HAART  DM -per primary service  Anthony Sar, MD [facility]  Subjective:   Patient seen and examined bedside. Remains confused intermittently. About to start HD. HD#1 yesterday, tolerated well, net uf 0.5L.   Objective:   BP (!) 128/58   Pulse 67   Temp 98 F (36.7 C) (Oral)   Resp 16   Ht  (1.651 m)   Wt 100.1 kg   LMP 03/31/2014 (LMP Unknown)   SpO2 96%   BMI 36.72 kg/m   Intake/Output Summary (Last 24 hours) at 02/01/2023 0804 Last data filed at 02/01/2023 0500 Gross per 24 hour  Intake 879.59 ml  Output 500 ml  Net 379.59 ml   Weight change:   Physical Exam: Gen: NAD, laying flat in bed CVS: RRR Resp: CTA b/l Abd: soft, nt Ext: trace edema b/l LEs Neuro: awake, following commands Dialysis access: [facility]  Imaging: CT Angio  Chest/Abd/Pel for Dissection W and/or W/WO  Result Date: 01/31/2023 CLINICAL DATA:  Follow-up aortic dissection. History of breast cancer. EXAM: CT ANGIOGRAPHY CHEST, ABDOMEN AND PELVIS TECHNIQUE: Non-contrast CT of the chest was initially obtained. Multidetector CT imaging through the chest, abdomen and pelvis was performed using the standard protocol during bolus administration of intravenous contrast. Multiplanar reconstructed images and MIPs were obtained and reviewed to evaluate the vascular anatomy. RADIATION DOSE REDUCTION: This exam was performed according to the departmental dose-optimization program which includes automated exposure control, adjustment of the mA and/or kV according to patient size and/or use of iterative reconstruction technique. CONTRAST:  OMNIPAQUE IOHEXOL 350 MG/ML SOLN COMPARISON:  CTA chest abdomen pelvis 01/29/2023 and CT abdomen 06/03/2019 FINDINGS: CTA CHEST FINDINGS Cardiovascular: Limited evaluation of the aortic root and ascending thoracic aorta due to motion artifact and pulsation artifact. However, there is no clear evidence for aortic dissection involving the ascending thoracic aorta. Ascending thoracic aorta and great vessels are patent. Bovine type arch. Again noted is a complex aortic dissection involving distal to the left subclavian artery. There is evidence for intramural hematoma involving the distal aortic arch and proximal descending thoracic aorta. Distal aortic arch measures 3.5 cm and roughly measured 3.8 cm on the most recent comparison examination. Mid descending thoracic aorta measures up to 3.9 cm and stable. Aorta at the hiatus measures 3.7 cm and stable. The dissection extends into the abdominal aorta. Limited evaluation for pulmonary embolism on this examination. Heart size is stable. No significant pericardial effusion. Right jugular  dialysis catheter has been placed and the tip is along the caudal aspect of the right atrium near the inferior  cavoatrial junction. Mediastinum/Nodes: Again noted is prominent mediastinal tissue. Precarinal lymph node measures 1.4 cm in the short axis and similar to the recent comparison examination. There is soft tissue fullness in the right hilum and subcarinal region. Again noted are prominent axillary lymph nodes but similar findings dating back to a chest CT from 09/27/2018. Small supraclavicular lymph nodes. Lungs/Pleura: New filling defects in the dependent aspect of the trachea and mainstem bronchi most likely represents mucus. New patchy airspace densities in the anterior right upper lobe on image 49/7. Increased densities at the right lung base likely represent atelectasis. No large pleural effusions. New patchy densities in the posterior left upper lobe. Patchy densities at the left lung base are again noted. Musculoskeletal: No acute bone abnormality. Review of the MIP images confirms the above findings. CTA ABDOMEN AND PELVIS FINDINGS VASCULAR Aorta: Aortic dissection extends down the length of the abdominal aorta. Proximal abdominal aorta just above the celiac artery measures 3.5 cm, previously measured 3.3 cm. Difficult to differentiate the true from the false lumen but suspect that the true lumen is on the right side. Lumen along the right side of the abdominal aorta has slightly enlarged compared to the previous examination. Infrarenal abdominal aorta measures 2.3 cm and stable. No periaortic stranding. No evidence for an aortic rupture. Celiac: Celiac trunk is patent and dissection flap is near the origin. Main celiac artery branches are patent. SMA: Originates from the right-sided lumen which may represent the true lumen. SMA is patent without significant stenosis. Renals: Right renal artery originates from the right-sided lumen and left renal artery originates from the left-sided lumen. Irregularity of the left renal artery which may be associated with the dissection. There is flow in the left renal  artery. IMA: IMA appears to originate from the left-sided aortic lumen. IMA is patent. Inflow: There is flow in the common, internal and external iliac arteries bilaterally. The dissection appears to extend into the left common iliac artery and there is narrowing at the origin of the left internal iliac artery. Dissection extends into the left external iliac artery and left common femoral artery. Dissection does not clearly involve the right iliac arteries. Proximal outflow: Dissection involves the left common femoral artery and appears to terminate in the proximal aspect of the left SFA. Left profunda femoral artery branches are patent. Proximal right femoral artery are patent. Veins: No obvious venous abnormality within the limitations of this arterial phase study. Review of the MIP images confirms the above findings. NON-VASCULAR Hepatobiliary: Contrast within the gallbladder compatible with patient's renal failure. There is a filling defect in the gallbladder suggestive for a polyp or stone. No gallbladder distension or inflammatory changes. No gross abnormality to the liver. Pancreas: Unremarkable. No pancreatic ductal dilatation or surrounding inflammatory changes. Spleen: No significant contrast within the splenic parenchyma likely associated with slow flow from the dissection. Minimal perisplenic stranding on image 109/6. Adrenals/Urinary Tract: Chronic diffuse thickening in the left adrenal gland. There appears to be at least mild thickening of the right adrenal gland. Retained contrast within the right renal collecting system compatible with patient's kidney failure. Dilatation of the right renal collecting system likely related to patient's previous staghorn calculi in this area. No significant ureter dilatation. Iodinated contrast within the urinary bladder. Heterogeneous appearance of the left kidney is also compatible with patient's kidney disease. Stomach/Bowel: Normal appearance of the stomach.  Proximal small loops are mildly dilated measuring up to 3.5 cm on image 106/6. Distal small bowel is decompressed. Right colon is moderately distended containing stool. There is mild swirling of the central mesentery which appears to be slightly different from the recent comparison examination. No focal bowel inflammation. Few diverticula involving the sigmoid colon. Lymphatic: Mildly prominent inguinal lymph nodes bilaterally. Multiple small lymph nodes in the periaortic retroperitoneum. Overall, the abdominal lymph nodes have not significantly changed since 2020. Reproductive: Uterus and bilateral adnexa are unremarkable. Other: No definite free fluid.  Negative for free air. Musculoskeletal: No acute bone abnormality. Scattered areas of subcutaneous edema. Review of the MIP images confirms the above findings. IMPRESSION: Vascular: 1. 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 The aortic lumen along the right side of the abdominal aorta presumably represents the true lumen and this has slightly enlarged since 01/29/2023. Dissection does not clearly involve the ascending thoracic aorta. Proximal abdominal aorta has minimally enlarged in size measuring up to 3.5 cm. 2. Blood flow to the major visceral arteries but there is marked irregularity in the proximal left renal artery associated with the dissection and dissection appears to involve the origin of the celiac trunk. Suspect delayed or decreased flow to the spleen with mild perisplenic stranding. 3. 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 4. Interval placement of a right jugular dialysis catheter with the tip near the inferior cavoatrial junction. Nonvascular: 1. New dilatation of the proximal small bowel. There is new swirling or twisting of the central mesentery.  Findings are nonspecific but could be associated with developing ileus or obstruction. Early ischemic changes cannot be excluded. Consider follow-up abdominal radiographs. 2. Vicarious excretion of contrast into the gallbladder compatible with patient's known renal failure. There is also retained contrast within the renal collecting systems related to the renal failure. 3. Filling defect in the gallbladder suggestive for a gallstone. 4. Patchy densities in both lungs could represent a combination of atelectasis and new airspace disease. Developing multifocal pneumonia cannot be excluded. 5. Prominent mediastinal tissue is nonspecific and recommend attention on follow-up imaging. These results were called by telephone at the time of interpretation on 01/31/2023 at 6:07 pm to provider Vernona Rieger P. Chestine Spore, DO, who verbally acknowledged these results. Electronically Signed   By: Richarda Overlie M.D.   On: 01/31/2023 18:08   IR Fluoro Guide CV Line Right  Result Date: 01/30/2023 INDICATION: END-STAGE RENAL DISEASE, ACUTE UREMIA EXAM: ULTRASOUND GUIDANCE FOR VASCULAR ACCESS RIGHT INTERNAL JUGULAR PERMANENT HEMODIALYSIS CATHETER Date:  01/30/2023 01/30/2023 4:09 pm Radiologist:  Judie Petit. Ruel Favors, MD Guidance:  Ultrasound and fluoroscopic FLUOROSCOPY: Fluoroscopy Time: 0 minutes 36 seconds (7 mGy). MEDICATIONS: 1% lidocaine local with epinephrine 2 g Ancef within 1 hour of the procedure ANESTHESIA/SEDATION: Versed 1.0 mg IV; Fentanyl 25 mcg IV; Moderate Sedation Time:  11 minute The patient was continuously monitored during the procedure by the interventional radiology nurse under my direct supervision. CONTRAST:  None. COMPLICATIONS: None immediate. PROCEDURE: Informed consent was obtained from the patient following explanation of the procedure, risks, benefits and alternatives. The patient understands, agrees and consents for the procedure. All questions were addressed. A time out was performed. Maximal barrier sterile technique  utilized including caps, mask, sterile gowns, sterile gloves, large sterile drape, hand hygiene, and 2% chlorhexidine scrub. Under sterile conditions and local anesthesia, right internal  jugular micropuncture venous access was performed with ultrasound. Images were obtained for documentation of the patent right internal jugular vein. A guide wire was inserted followed by a transitional dilator. Next, a 0.035 guidewire was advanced into the IVC with a 5-French catheter. Measurements were obtained from the right venotomy site to the proximal right atrium. In the right infraclavicular chest, a subcutaneous tunnel was created under sterile conditions and local anesthesia. 1% lidocaine with epinephrine was utilized for this. The 23 cm tip to cuff palindrome catheter was tunneled subcutaneously to the venotomy site and inserted into the SVC/RA junction through a valved peel-away sheath. Position was confirmed with fluoroscopy. Images were obtained for documentation. Blood was aspirated from the catheter followed by saline and heparin flushes. The appropriate volume and strength of heparin was instilled in each lumen. Caps were applied. The catheter was secured at the tunnel site with Gelfoam and a pursestring suture. The venotomy site was closed with subcuticular Vicryl suture. Dermabond was applied to the small right neck incision. A dry sterile dressing was applied. The catheter is ready for use. No immediate complications. IMPRESSION: Ultrasound and fluoroscopically guided right internal jugular tunneled hemodialysis catheter (23 cm tip to cuff palindrome catheter). Electronically Signed   By: Judie Petit.  Shick M.D.   On: 01/30/2023 16:16   IR US Guide Vasc Access Right  Result Date: 01/30/2023 INDICATION: END-STAGE RENAL DISEASE, ACUTE UREMIA EXAM: ULTRASOUND GUIDANCE FOR VASCULAR ACCESS RIGHT INTERNAL JUGULAR PERMANENT HEMODIALYSIS CATHETER Date:  01/30/2023 01/30/2023 4:09 pm Radiologist:  Judie Petit. Ruel Favors, MD Guidance:   Ultrasound and fluoroscopic FLUOROSCOPY: Fluoroscopy Time: 0 minutes 36 seconds (7 mGy). MEDICATIONS: 1% lidocaine local with epinephrine 2 g Ancef within 1 hour of the procedure ANESTHESIA/SEDATION: Versed 1.0 mg IV; Fentanyl 25 mcg IV; Moderate Sedation Time:  11 minute The patient was continuously monitored during the procedure by the interventional radiology nurse under my direct supervision. CONTRAST:  None. COMPLICATIONS: None immediate. PROCEDURE: Informed consent was obtained from the patient following explanation of the procedure, risks, benefits and alternatives. The patient understands, agrees and consents for the procedure. All questions were addressed. A time out was performed. Maximal barrier sterile technique utilized including caps, mask, sterile gowns, sterile gloves, large sterile drape, hand hygiene, and 2% chlorhexidine scrub. Under sterile conditions and local anesthesia, right internal jugular micropuncture venous access was performed with ultrasound. Images were obtained for documentation of the patent right internal jugular vein. A guide wire was inserted followed by a transitional dilator. Next, a 0.035 guidewire was advanced into the IVC with a 5-French catheter. Measurements were obtained from the right venotomy site to the proximal right atrium. In the right infraclavicular chest, a subcutaneous tunnel was created under sterile conditions and local anesthesia. 1% lidocaine with epinephrine was utilized for this. The 23 cm tip to cuff palindrome catheter was tunneled subcutaneously to the venotomy site and inserted into the SVC/RA junction through a valved peel-away sheath. Position was confirmed with fluoroscopy. Images were obtained for documentation. Blood was aspirated from the catheter followed by saline and heparin flushes. The appropriate volume and strength of heparin was instilled in each lumen. Caps were applied. The catheter was secured at the tunnel site with Gelfoam and a  pursestring suture. The venotomy site was closed with subcuticular Vicryl suture. Dermabond was applied to the small right neck incision. A dry sterile dressing was applied. The catheter is ready for use. No immediate complications. IMPRESSION: Ultrasound and fluoroscopically guided right internal jugular tunneled hemodialysis catheter (23 cm  tip to cuff palindrome catheter). Electronically Signed   By: Judie Petit.  Shick M.D.   On: 01/30/2023 16:16   VAS US RENAL ARTERY DUPLEX  Result Date: 01/30/2023 ABDOMINAL VISCERAL Patient Name:  ARLESIA KIEL [facility]  Date of Exam:   01/30/2023 Medical Rec #: [id number]              Accession #:    [id number] Date of Birth: [birth year]              Patient Gender: F Patient Age:   57 years Exam Location:  [facility] Procedure:      VAS US RENAL ARTERY DUPLEX Referring Phys: [id number] JOSHUA E ROBINS -------------------------------------------------------------------------------- Indications: Type B aortic dissection. High Risk Factors: Hypertension, hyperlipidemia, Diabetes. Other Factors: Chronic kidney disease and poorly controlled hypertension. GERD. Limitations: Obesity, air/bowel gas and Technically difficult/limited study due to patient body habitus. Comparison Study: 01/29/23 CT Angio CHEST/ABD/PEL showed dissection throughout                   the aorta into the iliac arteries, origin of the celiac and                   SMA. Performing Technologist: Marilynne Halsted RDMS, RVT  Examination Guidelines: A complete evaluation includes B-mode imaging, spectral Doppler, color Doppler, and power Doppler as needed of all accessible portions of each vessel. Bilateral testing is considered an integral part of a complete examination. Limited examinations for reoccurring indications may be performed as noted.  Duplex Findings: +--------------------+--------+--------+------+----------+ Mesenteric          PSV cm/sEDV cm/sPlaque Comments   +--------------------+--------+--------+------+----------+ Aorta Prox             94                 dissection +--------------------+--------+--------+------+----------+ Aorta Mid                                 dissection +--------------------+--------+--------+------+----------+ Aorta Distal                              dissection +--------------------+--------+--------+------+----------+ Celiac Artery Origin  386                            +--------------------+--------+--------+------+----------+ SMA Proximal          313                            +--------------------+--------+--------+------+----------+    +------------------+--------+--------+-------------+ Right Renal ArteryPSV cm/sEDV cm/s   Comment    +------------------+--------+--------+-------------+ Origin                            No visualized +------------------+--------+--------+-------------+ Proximal             77      20                 +------------------+--------+--------+-------------+ Mid                  92      7                  +------------------+--------+--------+-------------+ Distal               89  9                  +------------------+--------+--------+-------------+ +-----------------+--------+--------+--------------+ Left Renal ArteryPSV cm/sEDV cm/s   Comment     +-----------------+--------+--------+--------------+ Origin                           not visualized +-----------------+--------+--------+--------------+ Proximal            75      8                   +-----------------+--------+--------+--------------+ Mid                 74      10                  +-----------------+--------+--------+--------------+ Distal              86      18                  +-----------------+--------+--------+--------------+  Technologist observations: Incidental finding - gallstone.  +------------+--------+--------+----+-----------+--------+--------+----+ Right KidneyPSV cm/sEDV cm/sRI  Left KidneyPSV cm/sEDV cm/sRI   +------------+--------+--------+----+-----------+--------+--------+----+ Upper Pole  66      0       1.00Upper Pole 26      7       0.72 +------------+--------+--------+----+-----------+--------+--------+----+ Mid         65      0       1.        41      8       0.80 +------------+--------+--------+----+-----------+--------+--------+----+ Lower Pole  26      0       1.00Lower Pole                      +------------+--------+--------+----+-----------+--------+--------+----+ Hilar       37      10      0.73Hilar      36      11      0.70 +------------+--------+--------+----+-----------+--------+--------+----+ +------------------+-----+------------------+-----+ Right Kidney           Left Kidney             +------------------+-----+------------------+-----+ RAR                    RAR                     +------------------+-----+------------------+-----+ RAR (manual)      0.82 RAR (manual)      0.79  +------------------+-----+------------------+-----+ Cortex                 Cortex                  +------------------+-----+------------------+-----+ Cortex thickness       Corex thickness         +------------------+-----+------------------+-----+ Kidney length (cm)12.72Kidney length (cm)11.90 +------------------+-----+------------------+-----+  Summary: Renal:  Right: Patent right renal artery with high resistance waveforms        noted throughout without signifcant stenosis. Abnormal right        resisitive index with increased echogenicity within the renal        parenchyma. Left:  Patent left renal artery with high resistance waveforms noted        throughout without signifcant stenosis. Abnormal left        resisitive index with increased echogenicity within the renal  parenchyma. Mesenteric:   Aortic dissection was noted.  *See table(s) above for measurements and observations.  Diagnosing physician: Sherald Hess MD  Electronically signed by Sherald Hess MD on 01/30/2023 at 2:57:56 PM.    Final    ECHOCARDIOGRAM COMPLETE  Result Date: 01/30/2023    ECHOCARDIOGRAM REPORT   Patient Name:   [name] [facility] Date of Exam: 01/30/2023 Medical Rec #:  [id number]             Height:       65.0 in Accession #:    [id number]            Weight:       221.3 lb Date of Birth:  1966-08-27             BSA:          2.065 m Patient Age:    56 years              BP:           110/53 mmHg Patient Gender: F                     HR:           68 bpm. Exam Location:  Inpatient Procedure: 2D Echo, Cardiac Doppler and Color Doppler Indications:    Dissection of abdominal aorta I71.02  History:        Patient has prior history of Echocardiogram examinations, most                 recent 02/20/2020. CKD 4 and Aortic Dissection; Risk                 Factors:Hypertension, Diabetes, Dyslipidemia and Former Smoker.  Sonographer:    Dondra Prader RVT RCS Referring Phys: [id number] SUDHAM CHAND IMPRESSIONS  1. Left ventricular ejection fraction, by estimation, is 55 to 60%. The left ventricle has normal function. The left ventricle has no regional wall motion abnormalities. There is mild concentric left ventricular hypertrophy. Left ventricular diastolic parameters are consistent with Grade II diastolic dysfunction (pseudonormalization).  2. Right ventricular systolic function is normal. The right ventricular size is normal. Tricuspid regurgitation signal is inadequate for assessing PA pressure.  3. Left atrial size was mildly dilated.  4. The mitral valve is normal in structure. Mild to moderate mitral valve regurgitation. No evidence of mitral stenosis.  5. The aortic valve is calcified. Aortic valve regurgitation is not visualized. Aortic valve sclerosis/calcification is present, without any evidence of aortic stenosis.  6.  There appears a flap in the abdominal aorta concerning for abdominal aortic dissection - would recommend further testing with Abdominal CTA or MRA.  7. The inferior vena cava is dilated in size with <50% respiratory variability, suggesting right atrial pressure of 15 mmHg. FINDINGS  Left Ventricle: Left ventricular ejection fraction, by estimation, is 55 to 60%. The left ventricle has normal function. The left ventricle has no regional wall motion abnormalities. The left ventricular internal cavity size was normal in size. There is  mild concentric left ventricular hypertrophy. Left ventricular diastolic parameters are consistent with Grade II diastolic dysfunction (pseudonormalization). Right Ventricle: The right ventricular size is normal. No increase in right ventricular wall thickness. Right ventricular systolic function is normal. Tricuspid regurgitation signal is inadequate for assessing PA pressure. Left Atrium: Left atrial size was mildly dilated. Right Atrium: Right atrial size was normal in size. Pericardium: There is no evidence of pericardial effusion. Mitral Valve: The mitral  valve is normal in structure. Mild to moderate mitral valve regurgitation. No evidence of mitral valve stenosis. Tricuspid Valve: The tricuspid valve is normal in structure. Tricuspid valve regurgitation is mild . No evidence of tricuspid stenosis. Aortic Valve: The aortic valve is calcified. Aortic valve regurgitation is not visualized. Aortic valve sclerosis/calcification is present, without any evidence of aortic stenosis. Aortic valve mean gradient measures 6.5 mmHg. Aortic valve peak gradient measures 11.9 mmHg. Aortic valve area, by VTI measures 2.36 cm. Pulmonic Valve: The pulmonic valve was normal in structure. Pulmonic valve regurgitation is trivial. No evidence of pulmonic stenosis. Aorta: There appears a flap in the abdominal aorta concerning for abdominal aortic dissection - would recommend further testing with  Abdominal CTA or MRA. Venous: The inferior vena cava is dilated in size with less than 50% respiratory variability, suggesting right atrial pressure of 15 mmHg. IAS/Shunts: No atrial level shunt detected by color flow Doppler.  LEFT VENTRICLE PLAX 2D LVIDd:         5.40 cm   Diastology LVIDs:         3.50 cm   LV e' medial:    5.33 cm/s LV PW:         1.30 cm   LV E/e' medial:  22.5 LV IVS:        1.30 cm   LV e' lateral:   6.00 cm/s LVOT diam:     2.00 cm   LV E/e' lateral: 20.0 LV SV:         86 LV SV Index:   42 LVOT Area:     3.14 cm  RIGHT VENTRICLE             IVC RV Basal diam:  3.60 cm     IVC diam: 2.70 cm RV S prime:     13.80 cm/s TAPSE (M-mode): 2.5 cm LEFT ATRIUM              Index        RIGHT ATRIUM           Index LA diam:        4.40 cm  2.13 cm/m   RA Area:     16.30 cm LA Vol (A2C):   104.0 ml 50.36 ml/m  RA Volume:   45.00 ml  21.79 ml/m LA Vol (A4C):   60.3 ml  29.20 ml/m LA Biplane Vol: 81.9 ml  39.66 ml/m  AORTIC VALVE                     PULMONIC VALVE AV Area (Vmax):    2.46 cm      PV Vmax:       1.10 m/s AV Area (Vmean):   2.37 cm      PV Peak grad:  4.9 mmHg AV Area (VTI):     2.36 cm AV Vmax:           172.50 cm/s AV Vmean:          113.000 cm/s AV VTI:            0.366 m AV Peak Grad:      11.9 mmHg AV Mean Grad:      6.5 mmHg LVOT Vmax:         135.00 cm/s LVOT Vmean:        85.300 cm/s LVOT VTI:          0.275 m LVOT/AV VTI ratio: 0.75  AORTA Ao Root diam: 2.80  cm Ao Asc diam:  3.30 cm Ao Arch diam: 2.9 cm MITRAL VALVE MV Area (PHT): 2.62 cm     SHUNTS MV Decel Time: 289 msec     Systemic VTI:  0.28 m MV E velocity: 120.00 cm/s  Systemic Diam: 2.00 cm MV A velocity: 104.00 cm/s MV E/A ratio:  1.15 Kardie Tobb DO Electronically signed by Thomasene Ripple DO Signature Date/Time: 01/30/2023/2:00:43 PM    Final     Labs: BMET Recent Labs  Lab 01/28/23 2358 01/30/23 0355  NA 139 132*  K 3.7 4.4  CL 102 98  CO2 24 20*  GLUCOSE 156* 120*  BUN 54* 60*  CREATININE 5.99*  7.44*  CALCIUM 9.3 7.8*   CBC Recent Labs  Lab 01/28/23 2358 01/30/23 0355  WBC 14.2* 12.6*  HGB 8.7* 7.2*  HCT 25.7* 22.1*  MCV 97.0 101.8*  PLT 180 175    Medications:     amLODipine  10 mg Oral Daily   carvedilol  25 mg Oral BID   Chlorhexidine Gluconate Cloth  6 each Topical Daily   Chlorhexidine Gluconate Cloth  6 each Topical Q0600   dolutegravir  50 mg Oral Daily   emtricitabine-tenofovir AF  1 tablet Oral Daily   hydrALAZINE  100 mg Oral Q8H   insulin aspart  0-15 Units Subcutaneous Q4H   levothyroxine  175 mcg Oral Q0600   losartan  100 mg Oral Daily   pantoprazole  40 mg Oral Daily   rosuvastatin  10 mg Oral Daily   sertraline  50 mg Oral Daily      Anthony Sar, MD [facility] 02/01/2023, 8:04 AM

## 2023-02-01 NOTE — Procedures (Signed)
Cortrak  Tube Type:  Cortrak - 43 inches Tube Location:  Left nare Secured by: Bridle Technique Used to Measure Tube Placement:  Marking at nare/corner of mouth Cortrak Secured At:  70 cm   Cortrak Tube Team Note:  Consult received to place a Cortrak feeding tube.   X-ray is required, abdominal x-ray has been ordered by the Cortrak team. Please confirm tube placement before using the Cortrak tube.   If the tube becomes dislodged please keep the tube and contact the Cortrak team at [email or web address] for replacement.  If after hours and replacement cannot be delayed, place a NG tube and confirm placement with an abdominal x-ray.    Anetta Olvera MS, RD, LDN Please refer to AMION for RD and/or RD on-call/weekend/after hours pager   

## 2023-02-02 ENCOUNTER — Inpatient Hospital Stay (HOSPITAL_COMMUNITY): Payer: Medicare Other | Admitting: Anesthesiology

## 2023-02-02 ENCOUNTER — Inpatient Hospital Stay (HOSPITAL_COMMUNITY): Payer: Medicare Other

## 2023-02-02 ENCOUNTER — Encounter (HOSPITAL_COMMUNITY)
Admission: EM | Disposition: A | Discharge status: Home Health | Payer: Self-pay | Source: Home / Self Care | Attending: Internal Medicine

## 2023-02-02 DIAGNOSIS — Z87891 Personal history of nicotine dependence: Secondary | ICD-10-CM

## 2023-02-02 DIAGNOSIS — R739 Hyperglycemia, unspecified: Secondary | ICD-10-CM | POA: Diagnosis not present

## 2023-02-02 DIAGNOSIS — I161 Hypertensive emergency: Secondary | ICD-10-CM | POA: Diagnosis not present

## 2023-02-02 DIAGNOSIS — G9340 Encephalopathy, unspecified: Secondary | ICD-10-CM

## 2023-02-02 DIAGNOSIS — E1122 Type 2 diabetes mellitus with diabetic chronic kidney disease: Secondary | ICD-10-CM

## 2023-02-02 DIAGNOSIS — I7103 Dissection of thoracoabdominal aorta: Secondary | ICD-10-CM | POA: Diagnosis not present

## 2023-02-02 DIAGNOSIS — N186 End stage renal disease: Secondary | ICD-10-CM | POA: Diagnosis not present

## 2023-02-02 DIAGNOSIS — G9341 Metabolic encephalopathy: Secondary | ICD-10-CM | POA: Diagnosis not present

## 2023-02-02 DIAGNOSIS — I712 Thoracic aortic aneurysm, without rupture, unspecified: Secondary | ICD-10-CM | POA: Diagnosis not present

## 2023-02-02 DIAGNOSIS — Z794 Long term (current) use of insulin: Secondary | ICD-10-CM

## 2023-02-02 DIAGNOSIS — Z992 Dependence on renal dialysis: Secondary | ICD-10-CM

## 2023-02-02 DIAGNOSIS — I120 Hypertensive chronic kidney disease with stage 5 chronic kidney disease or end stage renal disease: Secondary | ICD-10-CM

## 2023-02-02 DIAGNOSIS — M314 Aortic arch syndrome [Takayasu]: Secondary | ICD-10-CM | POA: Diagnosis not present

## 2023-02-02 DIAGNOSIS — D631 Anemia in chronic kidney disease: Secondary | ICD-10-CM

## 2023-02-02 HISTORY — DX: Encephalopathy, unspecified: G93.40

## 2023-02-02 HISTORY — PX: THORACIC AORTIC ENDOVASCULAR STENT GRAFT: SHX6112

## 2023-02-02 LAB — BASIC METABOLIC PANEL
Anion gap: 16 — ABNORMAL HIGH (ref 5–15)
BUN: 45 mg/dL — ABNORMAL HIGH (ref 6–20)
CO2: 21 mmol/L — ABNORMAL LOW (ref 22–32)
Calcium: 7.5 mg/dL — ABNORMAL LOW (ref 8.9–10.3)
Chloride: 91 mmol/L — ABNORMAL LOW (ref 98–111)
Creatinine, Ser: 7.71 mg/dL — ABNORMAL HIGH (ref 0.44–1.00)
GFR, Estimated: 6 mL/min — ABNORMAL LOW (ref >60)
Glucose, Bld: 168 mg/dL — ABNORMAL HIGH (ref 70–99)
Potassium: 4 mmol/L (ref 3.5–5.1)
Sodium: 128 mmol/L — ABNORMAL LOW (ref 135–145)

## 2023-02-02 LAB — TRIGLYCERIDES: Triglycerides: 526 mg/dL — ABNORMAL HIGH (ref <150)

## 2023-02-02 LAB — TYPE AND SCREEN: Unit division: 0

## 2023-02-02 LAB — CBC
HCT: 25.4 % — ABNORMAL LOW (ref 36.0–46.0)
Hemoglobin: 8.8 g/dL — ABNORMAL LOW (ref 12.0–15.0)
MCH: 32.8 pg (ref 26.0–34.0)
MCHC: 34.6 g/dL (ref 30.0–36.0)
MCV: 94.8 fL (ref 80.0–100.0)
Platelets: 219 10*3/uL (ref 150–400)
RBC: 2.68 MIL/uL — ABNORMAL LOW (ref 3.87–5.11)
RDW: 16.4 % — ABNORMAL HIGH (ref 11.5–15.5)
WBC: 13.3 10*3/uL — ABNORMAL HIGH (ref 4.0–10.5)
nRBC: 0 % (ref 0.0–0.2)

## 2023-02-02 LAB — GLUCOSE, CAPILLARY
Glucose-Capillary: 148 mg/dL — ABNORMAL HIGH (ref 70–99)
Glucose-Capillary: 176 mg/dL — ABNORMAL HIGH (ref 70–99)
Glucose-Capillary: 151 mg/dL — ABNORMAL HIGH (ref 70–99)
Glucose-Capillary: 129 mg/dL — ABNORMAL HIGH (ref 70–99)
Glucose-Capillary: 149 mg/dL — ABNORMAL HIGH (ref 70–99)

## 2023-02-02 LAB — BPAM RBC
Blood Product Expiration Date: 202405172359
ISSUE DATE / TIME: 202404181601
Unit Type and Rh: 5100

## 2023-02-02 LAB — MAGNESIUM: Magnesium: 1.8 mg/dL (ref 1.7–2.4)

## 2023-02-02 LAB — PHOSPHORUS: Phosphorus: 7.6 mg/dL — ABNORMAL HIGH (ref 2.5–4.6)

## 2023-02-02 LAB — AMMONIA: Ammonia: 43 umol/L — ABNORMAL HIGH (ref 9–35)

## 2023-02-02 LAB — PREPARE RBC (CROSSMATCH)

## 2023-02-02 LAB — POCT ACTIVATED CLOTTING TIME: Activated Clotting Time: 223 seconds

## 2023-02-02 SURGERY — INSERTION, ENDOVASCULAR STENT GRAFT, AORTA, THORACIC
Anesthesia: General

## 2023-02-02 MED ORDER — LIDOCAINE 2% (20 MG/ML) 5 ML SYRINGE
INTRAMUSCULAR | Status: DC | PRN
  Administered 2023-02-02: 60 mg via INTRAVENOUS

## 2023-02-02 MED ORDER — PHENOL 1.4 % MT LIQD: 1.0000 | OROMUCOSAL | Status: DC | PRN

## 2023-02-02 MED ORDER — HEPARIN SODIUM (PORCINE) 1000 UNIT/ML IJ SOLN
INTRAMUSCULAR | Status: DC | PRN
  Administered 2023-02-02: 10000 [IU] via INTRAVENOUS
  Administered 2023-02-02: 3000 [IU] via INTRAVENOUS

## 2023-02-02 MED ORDER — IOHEXOL 350 MG/ML SOLN
100.0000 mL | Freq: Once | INTRAVENOUS | Status: AC | PRN
  Administered 2023-02-02: 100 mL via INTRAVENOUS

## 2023-02-02 MED ORDER — CEFAZOLIN SODIUM-DEXTROSE 2-3 GM-%(50ML) IV SOLR
INTRAVENOUS | Status: DC | PRN
  Administered 2023-02-02: 2 g via INTRAVENOUS

## 2023-02-02 MED ORDER — FENTANYL CITRATE (PF) 100 MCG/2ML IJ SOLN: 25.0000 ug | INTRAMUSCULAR | Status: DC | PRN

## 2023-02-02 MED ORDER — PHENYLEPHRINE 80 MCG/ML (10ML) SYRINGE FOR IV PUSH (FOR BLOOD PRESSURE SUPPORT)
PREFILLED_SYRINGE | INTRAVENOUS | Status: DC | PRN
  Administered 2023-02-02: 160 ug via INTRAVENOUS

## 2023-02-02 MED ORDER — CHLORHEXIDINE GLUCONATE 0.12 % MT SOLN
OROMUCOSAL | Status: AC
  Filled 2023-02-02: qty 15

## 2023-02-02 MED ORDER — SUCCINYLCHOLINE CHLORIDE 200 MG/10ML IV SOSY
PREFILLED_SYRINGE | INTRAVENOUS | Status: DC | PRN
  Administered 2023-02-02: 100 mg via INTRAVENOUS

## 2023-02-02 MED ORDER — LACTULOSE 10 GM/15ML PO SOLN
10.0000 g | Freq: Three times a day (TID) | ORAL | Status: DC
  Administered 2023-02-02 – 2023-02-04 (×7): 10 g
  Filled 2023-02-02 (×6): qty 15

## 2023-02-02 MED ORDER — GUAIFENESIN-DM 100-10 MG/5ML PO SYRP
15.0000 mL | ORAL_SOLUTION | ORAL | Status: DC | PRN
  Administered 2023-02-03: 15 mL via ORAL
  Filled 2023-02-02: qty 15

## 2023-02-02 MED ORDER — SODIUM CHLORIDE 0.9 % IV SOLN: INTRAVENOUS | Status: AC

## 2023-02-02 MED ORDER — POTASSIUM CHLORIDE CRYS ER 20 MEQ PO TBCR: 20.0000 meq | EXTENDED_RELEASE_TABLET | Freq: Every day | ORAL | Status: DC | PRN

## 2023-02-02 MED ORDER — ROCURONIUM BROMIDE 10 MG/ML (PF) SYRINGE
PREFILLED_SYRINGE | INTRAVENOUS | Status: DC | PRN
  Administered 2023-02-02: 70 mg via INTRAVENOUS

## 2023-02-02 MED ORDER — IODIXANOL 320 MG/ML IV SOLN
INTRAVENOUS | Status: DC | PRN
  Administered 2023-02-02: 85 mL via INTRA_ARTERIAL

## 2023-02-02 MED ORDER — 0.9 % SODIUM CHLORIDE (POUR BTL) OPTIME
TOPICAL | Status: DC | PRN
  Administered 2023-02-02: 1000 mL

## 2023-02-02 MED ORDER — FENTANYL CITRATE (PF) 250 MCG/5ML IJ SOLN
INTRAMUSCULAR | Status: DC | PRN
  Administered 2023-02-02: 150 ug via INTRAVENOUS

## 2023-02-02 MED ORDER — ASPIRIN 81 MG PO TBEC
81.0000 mg | DELAYED_RELEASE_TABLET | Freq: Every day | ORAL | Status: DC
  Filled 2023-02-02: qty 1

## 2023-02-02 MED ORDER — METOPROLOL TARTRATE 5 MG/5ML IV SOLN: 2.0000 mg | INTRAVENOUS | Status: DC | PRN

## 2023-02-02 MED ORDER — HEPARIN 6000 UNIT IRRIGATION SOLUTION
Status: AC
  Filled 2023-02-02: qty 500

## 2023-02-02 MED ORDER — CEFAZOLIN IN SODIUM CHLORIDE 3-0.9 GM/100ML-% IV SOLN
INTRAVENOUS | Status: AC
  Filled 2023-02-02: qty 100

## 2023-02-02 MED ORDER — CEFAZOLIN SODIUM-DEXTROSE 2-4 GM/100ML-% IV SOLN
INTRAVENOUS | Status: AC
  Filled 2023-02-02: qty 100

## 2023-02-02 MED ORDER — SUGAMMADEX SODIUM 200 MG/2ML IV SOLN
INTRAVENOUS | Status: DC | PRN
  Administered 2023-02-02: 204 mg via INTRAVENOUS

## 2023-02-02 MED ORDER — CEFAZOLIN SODIUM-DEXTROSE 2-4 GM/100ML-% IV SOLN
2.0000 g | Freq: Three times a day (TID) | INTRAVENOUS | Status: AC
  Administered 2023-02-02 – 2023-02-03 (×2): 2 g via INTRAVENOUS
  Filled 2023-02-02 (×2): qty 100

## 2023-02-02 MED ORDER — SODIUM CHLORIDE 0.9 % IV SOLN: 500.0000 mL | Freq: Once | INTRAVENOUS | Status: DC | PRN

## 2023-02-02 MED ORDER — ALUM & MAG HYDROXIDE-SIMETH 200-200-20 MG/5ML PO SUSP: 15.0000 mL | ORAL | Status: DC | PRN

## 2023-02-02 MED ORDER — HEPARIN 6000 UNIT IRRIGATION SOLUTION
Status: DC | PRN
  Administered 2023-02-02: 1

## 2023-02-02 MED ORDER — SODIUM CHLORIDE 0.9 % IV SOLN: 10.0000 mL/h | Freq: Once | INTRAVENOUS | Status: DC

## 2023-02-02 MED ORDER — FENTANYL CITRATE (PF) 100 MCG/2ML IJ SOLN
INTRAMUSCULAR | Status: AC
  Filled 2023-02-02: qty 2

## 2023-02-02 MED ORDER — ONDANSETRON HCL 4 MG/2ML IJ SOLN: 4.0000 mg | Freq: Once | INTRAMUSCULAR | Status: DC | PRN

## 2023-02-02 MED ORDER — PROTAMINE SULFATE 10 MG/ML IV SOLN
INTRAVENOUS | Status: DC | PRN
  Administered 2023-02-02: 50 mg via INTRAVENOUS

## 2023-02-02 MED ORDER — FENTANYL CITRATE (PF) 100 MCG/2ML IJ SOLN
50.0000 ug | Freq: Once | INTRAMUSCULAR | Status: AC
  Administered 2023-02-02: 50 ug via INTRAVENOUS

## 2023-02-02 MED ORDER — PHENYLEPHRINE HCL-NACL 20-0.9 MG/250ML-% IV SOLN
INTRAVENOUS | Status: DC | PRN
  Administered 2023-02-02: 25 ug/min via INTRAVENOUS

## 2023-02-02 MED ORDER — PROPOFOL 500 MG/50ML IV EMUL
INTRAVENOUS | Status: DC | PRN
  Administered 2023-02-02: 25 ug/kg/min via INTRAVENOUS

## 2023-02-02 MED ORDER — HEPARIN SODIUM (PORCINE) 5000 UNIT/ML IJ SOLN
5000.0000 [IU] | Freq: Three times a day (TID) | INTRAMUSCULAR | Status: DC
  Administered 2023-02-03 – 2023-02-09 (×19): 5000 [IU] via SUBCUTANEOUS
  Filled 2023-02-02 (×19): qty 1

## 2023-02-02 MED ORDER — ACETAMINOPHEN 650 MG RE SUPP: 325.0000 mg | RECTAL | Status: DC | PRN

## 2023-02-02 MED ORDER — FENTANYL CITRATE (PF) 250 MCG/5ML IJ SOLN
INTRAMUSCULAR | Status: AC
  Filled 2023-02-02: qty 5

## 2023-02-02 MED ORDER — PROPOFOL 10 MG/ML IV BOLUS
INTRAVENOUS | Status: DC | PRN
  Administered 2023-02-02: 50 mg via INTRAVENOUS

## 2023-02-02 MED ORDER — ACETAMINOPHEN 325 MG PO TABS
325.0000 mg | ORAL_TABLET | ORAL | Status: DC | PRN
  Administered 2023-02-03 – 2023-02-04 (×3): 650 mg via ORAL
  Filled 2023-02-02 (×3): qty 2

## 2023-02-02 MED ORDER — MAGNESIUM SULFATE 2 GM/50ML IV SOLN: 2.0000 g | Freq: Every day | INTRAVENOUS | Status: DC | PRN

## 2023-02-02 SURGICAL SUPPLY — 44 items
ADH SKN CLS APL DERMABOND .7 (GAUZE/BANDAGES/DRESSINGS) ×1
BAG COUNTER SPONGE SURGICOUNT (BAG) ×1 IMPLANT
BAG SPNG CNTER NS LX DISP (BAG) ×1
CANISTER SUCT 3000ML PPV (MISCELLANEOUS) ×1 IMPLANT
CATH ACCU-VU SIZ PIG 5F 100CM (CATHETERS) IMPLANT
CATH VISIONS PV .035 IVUS (CATHETERS) IMPLANT
DERMABOND ADVANCED .7 DNX12 (GAUZE/BANDAGES/DRESSINGS) ×1 IMPLANT
DEVICE CLOSURE PERCLS PRGLD 6F (VASCULAR PRODUCTS) ×2 IMPLANT
DRSG TEGADERM 2-3/8X2-3/4 SM (GAUZE/BANDAGES/DRESSINGS) ×1 IMPLANT
ELECT REM PT RETURN 9FT ADLT (ELECTROSURGICAL) ×2
ELECTRODE REM PT RTRN 9FT ADLT (ELECTROSURGICAL) ×2 IMPLANT
GLOVE BIOGEL PI IND STRL 8 (GLOVE) ×1 IMPLANT
GOWN STRL NON-REIN LRG LVL3 (GOWN DISPOSABLE) ×1 IMPLANT
GOWN STRL REUS W/ TWL LRG LVL3 (GOWN DISPOSABLE) ×1 IMPLANT
GOWN STRL REUS W/TWL 2XL LVL3 (GOWN DISPOSABLE) ×1 IMPLANT
GOWN STRL REUS W/TWL LRG LVL3 (GOWN DISPOSABLE) ×1
GRAFT BALLN CATH 65CM (BALLOONS) IMPLANT
KIT BASIN OR (CUSTOM PROCEDURE TRAY) ×1 IMPLANT
KIT TURNOVER KIT B (KITS) ×1 IMPLANT
NS IRRIG 1000ML POUR BTL (IV SOLUTION) ×2 IMPLANT
PACK ENDOVASCULAR (PACKS) ×1 IMPLANT
PAD ARMBOARD 7.5X6 YLW CONV (MISCELLANEOUS) ×2 IMPLANT
PERCLOSE PROGLIDE 6F (VASCULAR PRODUCTS) ×2
SET MICROPUNCTURE 5F STIFF (MISCELLANEOUS) ×1 IMPLANT
SHEATH AVANTI 11CM 8FR (SHEATH) ×1 IMPLANT
SHEATH DRYSEAL FLEX 24FR 33CM (SHEATH) IMPLANT
SHEATH PINNACLE 8F 10CM (SHEATH) IMPLANT
SLEEVE ISOL F/PACE RF HD COVER (MISCELLANEOUS) IMPLANT
STENT GRFT THORAC ACS 45X45X15 (Endovascular Graft) IMPLANT
STENT GRFT THORAC ACS 45X45X20 (Endovascular Graft) IMPLANT
STOPCOCK MORSE 400PSI 3WAY (MISCELLANEOUS) ×1 IMPLANT
SUT MNCRL AB 4-0 PS2 18 (SUTURE) ×1 IMPLANT
SUT PROLENE 5 0 C 1 24 (SUTURE) IMPLANT
SUT VIC AB 2-0 CTX 36 (SUTURE) IMPLANT
SUT VIC AB 3-0 SH 27 (SUTURE)
SUT VIC AB 3-0 SH 27X BRD (SUTURE) IMPLANT
SYR 30ML LL (SYRINGE) IMPLANT
SYR 50ML LL SCALE MARK (SYRINGE) ×1 IMPLANT
TOWEL GREEN STERILE (TOWEL DISPOSABLE) ×1 IMPLANT
TRAY FOLEY MTR SLVR 16FR STAT (SET/KITS/TRAYS/PACK) ×1 IMPLANT
TUBING HIGH PRESSURE 120CM (CONNECTOR) ×1 IMPLANT
TUBING INJECTOR 48 (MISCELLANEOUS) IMPLANT
WIRE BENTSON .035X145CM (WIRE) ×1 IMPLANT
WIRE STIFF LUNDERQUIST 260CM (WIRE) ×1 IMPLANT

## 2023-02-02 NOTE — Progress Notes (Addendum)
  Progress Note    02/02/2023 7:48 AM * No surgery found *  Subjective:  wants [place] to tell her daughter to "let her go"   Vitals:   02/02/23 0645 02/02/23 0700  BP:    Pulse: 72 71  Resp: 16 17  Temp:    SpO2: 94% 93%    Physical Exam: General:  resting with slightly increased work of breathing Cardiac:  regular Extremities:  palpable DP pulses Abdomen:  soft, NT  CBC    Component Value Date/Time   WBC 12.6 (H) 01/30/2023 0355   RBC 2.17 (L) 01/30/2023 0355   HGB 7.2 (L) 01/30/2023 0355   HGB 11.7 (L) 07/27/2021 1110   HCT 22.1 (L) 01/30/2023 0355   PLT 175 01/30/2023 0355   PLT 228 07/27/2021 1110   MCV 101.8 (H) 01/30/2023 0355   MCH 33.2 01/30/2023 0355   MCHC 32.6 01/30/2023 0355   RDW 16.9 (H) 01/30/2023 0355   LYMPHSABS 1.6 12/19/2022 1242   MONOABS 0.7 12/19/2022 1242   EOSABS 0.2 12/19/2022 1242   BASOSABS 0.0 12/19/2022 1242    BMET    Component Value Date/Time   NA 129 (L) 02/01/2023 0633   NA 142 12/05/2022 0000   K 4.4 02/01/2023 0633   CL 94 (L) 02/01/2023 0633   CO2 17 (L) 02/01/2023 0633   GLUCOSE 150 (H) 02/01/2023 0633   BUN 58 (H) 02/01/2023 0633   BUN 44 (A) 12/05/2022 0000   CREATININE 8.70 (H) 02/01/2023 0633   CREATININE 2.72 (H) 07/27/2021 1110   CREATININE 2.74 (H) 03/17/2021 1050   CALCIUM 7.4 (L) 02/01/2023 0633   GFRNONAA 5 (L) 02/01/2023 0633   GFRNONAA 20 (L) 07/27/2021 1110   GFRAA 39 (L) 07/14/2020 1053   GFRAA 33 (L) 08/12/2019 1447    INR    Component Value Date/Time   INR 1.0 07/14/2019 0312     Intake/Output Summary (Last 24 hours) at 02/02/2023 0748 Last data filed at 02/02/2023 0700 Gross per 24 hour  Intake 1418.11 ml  Output 1000 ml  Net 418.11 ml      Assessment/Plan:  57 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 MRI yesterday demonstrated no acute signs of ischemia. Continues to answer questions in a random pattern of "yes" and "no" -Still requiring  IV meds for BP control and refusing to take pills. She has also stopped eating and is requiring TPN -Unchanged physical exam with palpable DP pulses and no abdominal pain. Continue BP control -No vascular intervention at this time   Loel Dubonnet, [facility] [phone] 02/02/2023 7:48 AM  VASCULAR STAFF ADDENDUM: I have independently interviewed and examined the patient. I agree with the above.  Pt seen with new onset pain in the right leg, no pulse, signals monophasic Encephalopathic, idiopathic etiology at this time. MRI head negative.  BP well controlled.  Pending new imaging. Signal exam and pain at indications for repair.  Likely TEVAR today.  Following closely. Family aware.    Fara Olden, MD [facility] Phone Number: [phone] 02/02/2023 11:09 AM

## 2023-02-02 NOTE — Progress Notes (Signed)
[name] Progress Note    Assessment/ Plan:   CKD 5, now ESRD -followed by our office as an outpatient -started HD due to uremic symptoms. S/p [facility] 4/15. IHD #1 4/16. HD #2 4/17. Next treatment tentatively planned for Friday -CLIP underway for outpatient placement -she is interested in PD apparently down the road, will hold off on AVF/AVG for now unless plan changes -Follow daily labs, strict I/O -Avoid nephrotoxic medications including NSAIDs and iodinated intravenous contrast exposure unless the latter is absolutely indicated.  Preferred narcotic agents for pain control are hydromorphone, fentanyl, and methadone. Morphine should not be used. Avoid Baclofen and avoid oral sodium phosphate and magnesium citrate based laxatives / bowel preps. Continue strict Input and Output monitoring. 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 Both kidneys are perfusing -BP control as below  Encephlopathy -work up per primary service, will see if there is any improvement in mental status as she gets better clearance with HD (undergoing slow start protocol)  HTN -off esmolol, on cleviprex now -per CCM -will UF as tolerated  HIV -on HAART  DM -per primary service  Anthony Sar, MD [facility]  Subjective:   Patient seen and examined bedside. Remains confused, MRI without evidence of CVA. HD uneventful yesterday per report but patient reports that she did not tolerate On cleviprex Esmolol off   Objective:   BP (!) 124/57   Pulse 71   Temp 97.9 F (36.6 C) (Oral)   Resp 17   Ht 5\' 5"  (1.651 m)   Wt 102 kg   LMP 03/31/2014 (LMP Unknown)   SpO2 93%   BMI 37.42 kg/m   Intake/Output Summary (Last 24 hours) at 02/02/2023 0815 Last data filed at 02/02/2023 0700 Gross per 24 hour  Intake 1293.2 ml  Output 1000 ml  Net  293.2 ml   Weight change: -0.3 kg  Physical Exam: Gen: NAD, laying flat in bed CVS: RRR Resp: CTA b/l Abd: soft, nt Ext: trace edema b/l LEs Neuro: awake, following some commands Dialysis access: [facility]  Imaging: MR BRAIN WO CONTRAST  Result Date: 02/01/2023 CLINICAL DATA:  Delirium, acute encephalopathy EXAM: MRI HEAD WITHOUT CONTRAST TECHNIQUE: Multiplanar, multiecho pulse sequences of the brain and surrounding structures were obtained without intravenous contrast. COMPARISON:  None Available. FINDINGS: Brain: No restricted diffusion to suggest acute or subacute infarct. No acute hemorrhage, mass, mass effect, or midline shift. No hydrocephalus or extra-axial collection. Normal pituitary and craniocervical junction. No hemosiderin deposition to suggest remote hemorrhage. Cerebral volume is within normal limits for age. Diffusely dilated perivascular spaces, most prominent in the cortex near the vertex (series 15, image 14). Vascular: Normal arterial flow voids. Skull and upper cervical spine: Normal marrow signal. Sinuses/Orbits: Clear paranasal sinuses. No acute finding in the orbits. Other: The mastoid air cells are well aerated. IMPRESSION: 1. No acute intracranial process. 2. Diffusely dilated perivascular spaces. Electronically Signed   By: Wiliam Ke M.D.   On: 02/01/2023 18:06   DG Abd Portable 1V  Result Date: 02/01/2023 CLINICAL DATA:  Encounter for feeding tube placement. EXAM: PORTABLE ABDOMEN - 1 VIEW COMPARISON:  Chest x-ray on 10/13/2020, abdominal CT on 01/31/2023 FINDINGS: Feeding tube has been placed, tip overlying the level of the distal stomach. Visualized bowel gas pattern is nonobstructive. Patient has a RIGHT IJ central line, tip overlying the level of the LOWER  superior vena cava. IMPRESSION: Feeding tube tip overlying the level of the distal stomach. Electronically Signed   By: Norva Pavlov M.D.   On: 02/01/2023 14:35   CT Angio Chest/Abd/Pel for Dissection W  and/or W/WO  Result Date: 01/31/2023 CLINICAL DATA:  Follow-up aortic dissection. History of breast cancer. EXAM: CT ANGIOGRAPHY CHEST, ABDOMEN AND PELVIS TECHNIQUE: Non-contrast CT of the chest was initially obtained. Multidetector CT imaging through the chest, abdomen and pelvis was performed using the standard protocol during bolus administration of intravenous contrast. Multiplanar reconstructed images and MIPs were obtained and reviewed to evaluate the vascular anatomy. RADIATION DOSE REDUCTION: This exam was performed according to the departmental dose-optimization program which includes automated exposure control, adjustment of the mA and/or kV according to patient size and/or use of iterative reconstruction technique. CONTRAST:  OMNIPAQUE IOHEXOL 350 MG/ML SOLN COMPARISON:  CTA chest abdomen pelvis 01/29/2023 and CT abdomen 06/03/2019 FINDINGS: CTA CHEST FINDINGS Cardiovascular: Limited evaluation of the aortic root and ascending thoracic aorta due to motion artifact and pulsation artifact. However, there is no clear evidence for aortic dissection involving the ascending thoracic aorta. Ascending thoracic aorta and great vessels are patent. Bovine type arch. Again noted is a complex aortic dissection involving distal to the left subclavian artery. There is evidence for intramural hematoma involving the distal aortic arch and proximal descending thoracic aorta. Distal aortic arch measures 3.5 cm and roughly measured 3.8 cm on the most recent comparison examination. Mid descending thoracic aorta measures up to 3.9 cm and stable. Aorta at the hiatus measures 3.7 cm and stable. The dissection extends into the abdominal aorta. Limited evaluation for pulmonary embolism on this examination. Heart size is stable. No significant pericardial effusion. Right jugular dialysis catheter has been placed and the tip is along the caudal aspect of the right atrium near the inferior cavoatrial junction.  Mediastinum/Nodes: Again noted is prominent mediastinal tissue. Precarinal lymph node measures 1.4 cm in the short axis and similar to the recent comparison examination. There is soft tissue fullness in the right hilum and subcarinal region. Again noted are prominent axillary lymph nodes but similar findings dating back to a chest CT from 09/27/2018. Small supraclavicular lymph nodes. Lungs/Pleura: New filling defects in the dependent aspect of the trachea and mainstem bronchi most likely represents mucus. New patchy airspace densities in the anterior right upper lobe on image 49/7. Increased densities at the right lung base likely represent atelectasis. No large pleural effusions. New patchy densities in the posterior left upper lobe. Patchy densities at the left lung base are again noted. Musculoskeletal: No acute bone abnormality. Review of the MIP images confirms the above findings. CTA ABDOMEN AND PELVIS FINDINGS VASCULAR Aorta: Aortic dissection extends down the length of the abdominal aorta. Proximal abdominal aorta just above the celiac artery measures 3.5 cm, previously measured 3.3 cm. Difficult to differentiate the true from the false lumen but suspect that the true lumen is on the right side. Lumen along the right side of the abdominal aorta has slightly enlarged compared to the previous examination. Infrarenal abdominal aorta measures 2.3 cm and stable. No periaortic stranding. No evidence for an aortic rupture. Celiac: Celiac trunk is patent and dissection flap is near the origin. Main celiac artery branches are patent. SMA: Originates from the right-sided lumen which may represent the true lumen. SMA is patent without significant stenosis. Renals: Right renal artery originates from the right-sided lumen and left renal artery originates from the left-sided lumen. Irregularity of the left renal  artery which may be associated with the dissection. There is flow in the left renal artery. IMA: IMA appears  to originate from the left-sided aortic lumen. IMA is patent. Inflow: There is flow in the common, internal and external iliac arteries bilaterally. The dissection appears to extend into the left common iliac artery and there is narrowing at the origin of the left internal iliac artery. Dissection extends into the left external iliac artery and left common femoral artery. Dissection does not clearly involve the right iliac arteries. Proximal outflow: Dissection involves the left common femoral artery and appears to terminate in the proximal aspect of the left SFA. Left profunda femoral artery branches are patent. Proximal right femoral artery are patent. Veins: No obvious venous abnormality within the limitations of this arterial phase study. Review of the MIP images confirms the above findings. NON-VASCULAR Hepatobiliary: Contrast within the gallbladder compatible with patient's renal failure. There is a filling defect in the gallbladder suggestive for a polyp or stone. No gallbladder distension or inflammatory changes. No gross abnormality to the liver. Pancreas: Unremarkable. No pancreatic ductal dilatation or surrounding inflammatory changes. Spleen: No significant contrast within the splenic parenchyma likely associated with slow flow from the dissection. Minimal perisplenic stranding on image 109/6. Adrenals/Urinary Tract: Chronic diffuse thickening in the left adrenal gland. There appears to be at least mild thickening of the right adrenal gland. Retained contrast within the right renal collecting system compatible with patient's kidney failure. Dilatation of the right renal collecting system likely related to patient's previous staghorn calculi in this area. No significant ureter dilatation. Iodinated contrast within the urinary bladder. Heterogeneous appearance of the left kidney is also compatible with patient's kidney disease. Stomach/Bowel: Normal appearance of the stomach. Proximal small loops are  mildly dilated measuring up to 3.5 cm on image 106/6. Distal small bowel is decompressed. Right colon is moderately distended containing stool. There is mild swirling of the central mesentery which appears to be slightly different from the recent comparison examination. No focal bowel inflammation. Few diverticula involving the sigmoid colon. Lymphatic: Mildly prominent inguinal lymph nodes bilaterally. Multiple small lymph nodes in the periaortic retroperitoneum. Overall, the abdominal lymph nodes have not significantly changed since 2020. Reproductive: Uterus and bilateral adnexa are unremarkable. Other: No definite free fluid.  Negative for free air. Musculoskeletal: No acute bone abnormality. Scattered areas of subcutaneous edema. Review of the MIP images confirms the above findings. IMPRESSION: Vascular: 1. 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 The aortic lumen along the right side of the abdominal aorta presumably represents the true lumen and this has slightly enlarged since 01/29/2023. Dissection does not clearly involve the ascending thoracic aorta. Proximal abdominal aorta has minimally enlarged in size measuring up to 3.5 cm. 2. Blood flow to the major visceral arteries but there is marked irregularity in the proximal left renal artery associated with the dissection and dissection appears to involve the origin of the celiac trunk. Suspect delayed or decreased flow to the spleen with mild perisplenic stranding. 3. 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 4. Interval placement of a right jugular dialysis catheter with the tip near the inferior cavoatrial junction. Nonvascular: 1. New dilatation of the proximal small bowel. There is new swirling or twisting of the central mesentery. Findings are nonspecific  but could be associated with developing ileus or obstruction. Early  ischemic changes cannot be excluded. Consider follow-up abdominal radiographs. 2. Vicarious excretion of contrast into the gallbladder compatible with patient's known renal failure. There is also retained contrast within the renal collecting systems related to the renal failure. 3. Filling defect in the gallbladder suggestive for a gallstone. 4. Patchy densities in both lungs could represent a combination of atelectasis and new airspace disease. Developing multifocal pneumonia cannot be excluded. 5. Prominent mediastinal tissue is nonspecific and recommend attention on follow-up imaging. These results were called by telephone at the time of interpretation on 01/31/2023 at 6:07 pm to provider Vernona Rieger P. Chestine Spore, DO, who verbally acknowledged these results. Electronically Signed   By: Richarda Overlie M.D.   On: 01/31/2023 18:08    Labs: BMET Recent Labs  Lab 01/28/23 2358 01/30/23 0355 02/01/23 0633 02/02/23 0423  NA 139 132* 129*  --   K 3.7 4.4 4.4  --   CL 102 98 94*  --   CO2 24 20* 17*  --   GLUCOSE 156* 120* 150*  --   BUN 54* 60* 58*  --   CREATININE 5.99* 7.44* 8.70*  --   CALCIUM 9.3 7.8* 7.4*  --   PHOS  --   --  9.0* 7.6*   CBC Recent Labs  Lab 01/28/23 2358 01/30/23 0355  WBC 14.2* 12.6*  HGB 8.7* 7.2*  HCT 25.7* 22.1*  MCV 97.0 101.8*  PLT 180 175    Medications:     amLODipine  10 mg Per Tube Daily   carvedilol  25 mg Per Tube BID   Chlorhexidine Gluconate Cloth  6 each Topical Daily   Chlorhexidine Gluconate Cloth  6 each Topical Q0600   dolutegravir  50 mg Per Tube Daily   emtricitabine-tenofovir AF  1 tablet Per Tube Daily   feeding supplement (PROSource TF20)  60 mL Per Tube Daily   feeding supplement (VITAL HIGH PROTEIN)  1,000 mL Per Tube Q24H   hydrALAZINE  100 mg Per Tube Q8H   insulin aspart  0-15 Units Subcutaneous Q4H   levothyroxine  175 mcg Per Tube Q0600   losartan  100 mg Per Tube Daily    multivitamin  1 tablet Per Tube QHS   pantoprazole  40 mg Oral Daily   rosuvastatin  10 mg Per Tube Daily   sertraline  50 mg Per Tube Daily      Anthony Sar, MD [facility] 02/02/2023, 8:15 AM

## 2023-02-02 NOTE — Op Note (Signed)
    NAME: [name]    MRN: [id number] DOB: Jul 09, 1966    DATE OF OPERATION: 02/02/2023  PREOP DIAGNOSIS:    Acute aortic syndrome, complicated IV dissection  POSTOP DIAGNOSIS:    Same  PROCEDURE:    Thoracic, mesenteric, infrarenal abdominal angiograms Intravascular ultrasound Thoracic endovascular aortic repair 45 x 20 cm, 45 x 15 cm Gore excluder  SURGEON: Victorino Sparrow  ASSIST: Elna Breslow, MD  ANESTHESIA: General   EBL: 15ml  INDICATIONS:    [name] is a 57 y.o. female who presented to the weekend with an acute type B aortic dissection.  She was doing well, and was asymptomatic with anti and pulse control.  This morning, she noted right lower extremity pain with associated numbness.  She had a monophasic signal.  Follow-up CT angio demonstrated severe compression of the true lumen limiting perfusion to the right leg.  After discussing the risk and benefits of thoracic endovascular aortic repair in an effort to improve perfusion to the right lower extremity, and alleviate her symptoms, [name] elected to proceed.  FINDINGS:   Intramural hematoma in zone 3 of the aorta Complicated type B aortic dissection beginning in zone 4, extending into the left superficial femoral artery on CT  TECHNIQUE:   Patient brought to the OR laid in supine position.  General anesthesia was induced and patient was prepped draped in standard fashion. Next in the case began with ultrasound insonation of the right common femoral artery.  This was accessed using an ultrasound-guided micropuncture needle.  This was dilated to 7 [place], and was followed by 2 Pro-glide devices which were used in pre-close fashion. A wire was taken into the ascending aorta.  I did not meet resistance.  Over this wire intravascular sound was used to assess whether the wire was true lumen.  It was.  A run was saved and can be viewed. The left subclavian was marked.  Next, the 8  [place] access was dilated to 24 [place] Gore dry seal.  The device was then delivered into the ascending aorta followed by a pigtail catheter from the same, 24 [place] sheath.  The 45 x 20 cm Gore conformable device was positioned, and thoracic angiogram followed confirming the location of the left subclavian artery.  The graft was then deployed in standard fashion immediately distal to the left subclavian.  Follow-up angiography demonstrated excellent result with filling of the arch vessels.  Next, I moved to the visceral segment.  Angiography was used to assess location of the celiac, SMA, bilateral renal arteries.  The celiac artery was marked.  A 45 x 15cm excluder device was brought onto the field, and position with over 5 cm of overlap.  This was deployed and landed immediately proximal to the celiac artery.  Follow-up angiography demonstrated excellent result with no filling of the false lumen.  There was excellent filling of the visceral vessels, infrarenal aorta, bilateral iliac arteries.  The 24 French sheath was removed, and the previously deployed was were tightened.  Angiography from the right groin demonstrated a small amount of stenosis at the common femoral artery from closure, but there was brisk flow throughout.  Case completion the, the patient had multiphasic signals in bilateral lower extremities.  Palpable pulses in the groin.  Ladonna Snide, MD [facility] DATE OF DICTATION:   02/02/2023

## 2023-02-02 NOTE — Progress Notes (Signed)
  Progress Note  Addend: Type B aortic dissection is not deemed complicated due to right lower extremity ischemia.  [name] would benefit from operative repair, TEVAR.  I discussed this at length with her and her daughter.  Being that she is encephalopathic, her daughter elected to proceed.  We discussed death, organ dysfunction, paralysis, amongst other complications.  02/02/2023 3:22 PM Day of Surgery  Subjective:  wants [place] to tell her daughter to "let her go"   Vitals:   02/02/23 1507 02/02/23 1510  BP:    Pulse: 66 65  Resp: 13 17  Temp:    SpO2: 90% 92%    Physical Exam: General:  resting with slightly increased work of breathing Cardiac:  regular Extremities:  palpable DP pulses Abdomen:  soft, NT  CBC    Component Value Date/Time   WBC 12.6 (H) 01/30/2023 0355   RBC 2.17 (L) 01/30/2023 0355   HGB 7.2 (L) 01/30/2023 0355   HGB 11.7 (L) 07/27/2021 1110   HCT 22.1 (L) 01/30/2023 0355   PLT 175 01/30/2023 0355   PLT 228 07/27/2021 1110   MCV 101.8 (H) 01/30/2023 0355   MCH 33.2 01/30/2023 0355   MCHC 32.6 01/30/2023 0355   RDW 16.9 (H) 01/30/2023 0355   LYMPHSABS 1.6 12/19/2022 1242   MONOABS 0.7 12/19/2022 1242   EOSABS 0.2 12/19/2022 1242   BASOSABS 0.0 12/19/2022 1242    BMET    Component Value Date/Time   NA 128 (L) 02/02/2023 0854   NA 142 12/05/2022 0000   K 4.0 02/02/2023 0854   CL 91 (L) 02/02/2023 0854   CO2 21 (L) 02/02/2023 0854   GLUCOSE 168 (H) 02/02/2023 0854   BUN 45 (H) 02/02/2023 0854   BUN 44 (A) 12/05/2022 0000   CREATININE 7.71 (H) 02/02/2023 0854   CREATININE 2.72 (H) 07/27/2021 1110   CREATININE 2.74 (H) 03/17/2021 1050   CALCIUM 7.5 (L) 02/02/2023 0854   GFRNONAA 6 (L) 02/02/2023 0854   GFRNONAA 20 (L) 07/27/2021 1110   GFRAA 39 (L) 07/14/2020 1053   GFRAA 33 (L) 08/12/2019 1447    INR    Component Value Date/Time   INR 1.0 07/14/2019 0312     Intake/Output Summary (Last 24 hours) at 02/02/2023 1522 Last data filed at  02/02/2023 1400 Gross per 24 hour  Intake 1495.81 ml  Output 0 ml  Net 1495.81 ml       Assessment/Plan:  57 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 MRI yesterday demonstrated no acute signs of ischemia. Continues to answer questions in a random pattern of "yes" and "no" -Still requiring IV meds for BP control and refusing to take pills. She has also stopped eating and is requiring TPN -Unchanged physical exam with palpable DP pulses and no abdominal pain. Continue BP control -No vascular intervention at this time   Loel Dubonnet, [place] [facility] [phone] 02/02/2023 3:22 PM  VASCULAR STAFF ADDENDUM: I have independently interviewed and examined the patient. I agree with the above.  Pt seen with new onset pain in the right leg, no pulse, signals monophasic Encephalopathic, idiopathic etiology at this time. MRI head negative.  BP well controlled.  Pending new imaging. Signal exam and pain at indications for repair.  Likely TEVAR today.  Following closely. Family aware.    Fara Olden, MD [facility] Phone Number: [phone] 02/02/2023 3:22 PM

## 2023-02-02 NOTE — TOC CM/SW Note (Signed)
[employer] RN CM contact is Marisue Ivan [phone] EX: [phone] will assist with dc needs. Isidoro Donning RN3 CCM, Heart Failure TOC CM [phone]

## 2023-02-02 NOTE — Anesthesia Preprocedure Evaluation (Addendum)
Anesthesia Evaluation  Patient identified by MRN, date of birth, ID band Patient confused    Reviewed: Allergy & Precautions, NPO status , Patient's Chart, lab work & pertinent test results, reviewed documented beta blocker date and time   Airway Mallampati: IV  TM Distance: >3 FB Neck ROM: Full    Dental  (+) Teeth Intact, Dental Advisory Given   Pulmonary asthma , former smoker   Pulmonary exam normal breath sounds clear to auscultation       Cardiovascular hypertension, Pt. on medications and Pt. on home beta blockers + Peripheral Vascular Disease (Acute Aortic Syndrome)  Normal cardiovascular exam Rhythm:Regular Rate:Normal  Cleviprex gtt- max dose  Echo 01/30/23: 1. Left ventricular ejection fraction, by estimation, is 55 to 60%. The  left ventricle has normal function. The left ventricle has no regional  wall motion abnormalities. There is mild concentric left ventricular  hypertrophy. Left ventricular diastolic parameters are consistent with Grade II diastolic dysfunction (pseudonormalization).   2. Right ventricular systolic function is normal. The right ventricular  size is normal. Tricuspid regurgitation signal is inadequate for assessing  PA pressure.   3. Left atrial size was mildly dilated.   4. The mitral valve is normal in structure. Mild to moderate mitral valve  regurgitation. No evidence of mitral stenosis.   5. The aortic valve is calcified. Aortic valve regurgitation is not  visualized. Aortic valve sclerosis/calcification is present, without any  evidence of aortic stenosis.   6. There appears a flap in the abdominal aorta concerning for abdominal  aortic dissection - would recommend further testing with Abdominal CTA or  MRA.   7. The inferior vena cava is dilated in size with <50% respiratory  variability, suggesting right atrial pressure of 15 mmHg.     Neuro/Psych  PSYCHIATRIC DISORDERS Anxiety  Depression    negative neurological ROS     GI/Hepatic Neg liver ROS,GERD  Medicated,,  Endo/Other  diabetes, Type 2, Insulin DependentHypothyroidism  Obesity   Renal/GU ESRF and DialysisRenal disease     Musculoskeletal  (+) Arthritis ,    Abdominal   Peds  Hematology  (+) Blood dyscrasia, anemia , HIV  Anesthesia Other Findings   Reproductive/Obstetrics                              Anesthesia Physical Anesthesia Plan  ASA: 4  Anesthesia Plan: General   Post-op Pain Management:    Induction: Intravenous  PONV Risk Score and Plan: 3 and Dexamethasone and Ondansetron  Airway Management Planned: Oral ETT and Video Laryngoscope Planned  Additional Equipment: Arterial line  Intra-op Plan:   Post-operative Plan: Possible Post-op intubation/ventilation  Informed Consent: I have reviewed the patients History and Physical, chart, labs and discussed the procedure including the risks, benefits and alternatives for the proposed anesthesia with the patient or authorized representative who has indicated his/her understanding and acceptance.     Dental advisory given  Plan Discussed with: CRNA  Anesthesia Plan Comments:         Anesthesia Quick Evaluation

## 2023-02-02 NOTE — Anesthesia Procedure Notes (Addendum)
Procedure Name: Intubation Date/Time: 02/02/2023 3:40 PM  Performed by: Darryl Nestle, CRNAPre-anesthesia Checklist: Patient identified, Emergency Drugs available, Suction available and Patient being monitored Patient Re-evaluated:Patient Re-evaluated prior to induction Oxygen Delivery Method: Circle system utilized Preoxygenation: Pre-oxygenation with 100% oxygen Induction Type: IV induction Ventilation: Mask ventilation without difficulty Laryngoscope Size: Mac, 3 and Glidescope Grade View: Grade II Tube type: Oral Tube size: 7.5 mm Number of attempts: 1 Airway Equipment and Method: Stylet and Oral airway Placement Confirmation: ETT inserted through vocal cords under direct vision, positive ETCO2 and breath sounds checked- equal and bilateral Secured at: 22 cm Tube secured with: Tape Dental Injury: Teeth and Oropharynx as per pre-operative assessment

## 2023-02-02 NOTE — Transfer of Care (Signed)
Immediate Anesthesia Transfer of Care Note  Patient: [name]  Procedure(s) Performed: THORACIC AORTIC ENDOVASCULAR STENT GRAFT  Patient Location: PACU  Anesthesia Type:General  Level of Consciousness: drowsy  Airway & Oxygen Therapy: Patient Spontanous Breathing  Post-op Assessment: Report given to RN and Post -op Vital signs reviewed and stable  Post vital signs: Reviewed and stable  Last Vitals:  Vitals Value Taken Time  BP 150/60   Temp 97.5   Pulse 62 02/02/23 1700  Resp 14 02/02/23 1700  SpO2 93 % 02/02/23 1700  Vitals shown include unvalidated device data.  Last Pain:  Vitals:   02/02/23 1225  TempSrc: Oral  PainSc:       Patients Stated Pain Goal: 0 (02/01/23 1426)  Complications: No notable events documented.

## 2023-02-02 NOTE — Progress Notes (Signed)
Pt transferred to short stay via monitored bed and left in care of RN staff with bedside report given.  VSS.

## 2023-02-02 NOTE — Progress Notes (Signed)
NAME:  [name], MRN:  [id number], DOB:  1966-02-07, LOS: 4 ADMISSION DATE:  01/29/2023, CONSULTATION DATE:  01/29/23 REFERRING MD:  EDP, CHIEF COMPLAINT:  aortic dissection   History of Present Illness:  57 yo female with pmh t2dm, ckd4, hiv, sarcoidosis who prsented to [facility] with complaints of back and abdominal pain with nausea and non bloody vomiting. Her family at bedside endorse this has been ongoing for 2 days not. Family provides most history as pt is quite drowsy after pain medication. The state that pt had no other associated symptoms. She had been trying to treat herself for consitpation thinking that was the cause of her pain. No fevers/ chills. No diarrhea. No recent change in medications.   She presented with elevated BP. Systolics >200. She was started on esmolol and nitro gtts and vascular is coming to eval.   Pertinent  Medical History  T2dm Htn Hyperlipidemia Hiv Hypothyroidism ckd4  Significant Hospital Events: Including procedures, antibiotic start and stop dates in addition to other pertinent events   Admitted to hospital 4/14, on cardene and esmolol 4/15 TDC placement, uremic encephalopathy 4/18 CT angio c/a/b, AO bifem   Interim History / Subjective:   Variable level of engagement w staff this morning  Objective   Blood pressure (!) 124/57, pulse 69, temperature 98.3 F (36.8 C), temperature source Oral, resp. rate 16, height  (1.651 m), weight 102 kg, last menstrual period 03/31/2014, SpO2 90 %.        Intake/Output Summary (Last 24 hours) at 02/02/2023 1036 Last data filed at 02/02/2023 0900 Gross per 24 hour  Intake 1456.17 ml  Output 0 ml  Net 1456.17 ml   Filed Weights   02/01/23 0727 02/01/23 1020 02/02/23 0500  Weight: 100.1 kg 99 kg 102 kg    Examination: General: chronically and acutely ill F NAD  HENT: NCAT redundant neck tissue  Lungs: Symmetrical chest expansion  Cardiovascular: rrr s1s2  Abdomen: obese soft  ndnt  Extremities: no cyanosis or clubbing  Neuro: Awake, Oriented x 3. High degree of variability w following commands -- seems to move BUE BLE spontaneously but will not move RLE to command for me, will not hold L arm up to command but lifts L arm to scratch nose   Derm: c/d/w  Resolved Hospital Problem list     Assessment & Plan:    Type B aortic dissection -- proximal descending thoracic aorta distal to origin of L subclav to iliacs, extending LLE to bifurcation of superficial and profunda fem arteries  HTN emergency  P -VVS following -SBP goal <120 HR goal <60 on clevi, esmolol available -coreg  BID, amlodipine  qD, hydral  q8, losartan  qD,  -going for CT angio today, ? Consideration of TEVAR   Acute encephalopathy -variable. Seems like for some providers is answering yes/no in inappropriate pattern, for me is Ox3, then confused  -MRI without watershed  P -delirium precautions  -ordering ammonia level   AKI on CKD 4 Hyponatremia  P -HD per nephro  -minimize nephrotoxic meds as able  -TDC placed 4/15; will be discharged with this line but eventually will transition from HD to PD as OP. -ordering BMP 4/18   Elevated triglycerides  -on clevi, wean as able   -antihypertensive efforts as above   DM2  -SSI   HIV; well controlled recently with undetectable VL and CD4 685 in 12/2022 -cont tivicay, descovy  Hypothyroidism -synthroid   GERD -PPI  Poor PO intake  Nausea P -EN -PRN antiemetic  -meds per tube     Best Practice (right click and "Reselect all SmartList Selections" daily)   Diet/type: Regular consistency (see orders)- EN DVT prophylaxis: SCD GI prophylaxis: PPI Lines: Dialysis Catheter Foley:  N/A Code Status:  full code Last date of multidisciplinary goals of care discussion [sister updated 4/16]  Labs   CBC: Recent Labs  Lab 01/28/23 2358 01/30/23 0355  WBC 14.2* 12.6*  HGB 8.7* 7.2*  HCT 25.7* 22.1*  MCV 97.0  101.8*  PLT 180 175    Basic Metabolic Panel: Recent Labs  Lab 01/28/23 2358 01/30/23 0355 02/01/23 0633 02/02/23 0423 02/02/23 0854  NA 139 132* 129*  --  128*  K 3.7 4.4 4.4  --  4.0  CL 102 98 94*  --  91*  CO2 24 20* 17*  --  21*  GLUCOSE 156* 120* 150*  --  168*  BUN 54* 60* 58*  --  45*  CREATININE 5.99* 7.44* 8.70*  --  7.71*  CALCIUM 9.3 7.8* 7.4*  --  7.5*  MG  --   --   --  1.8  --   PHOS  --   --  9.0* 7.6*  --    GFR: Estimated Creatinine Clearance: 9.9 mL/min (A) (by C-G formula based on SCr of 7.71 mg/dL (H)). Recent Labs  Lab 01/28/23 2358 01/30/23 0355 01/31/23 1849  WBC 14.2* 12.6*  --   LATICACIDVEN  --   --  0.9     CRITICAL CARE Performed by: Lanier Clam   Total critical care time: 42 minutes  Critical care time was exclusive of separately billable procedures and treating other patients. Critical care was necessary to treat or prevent imminent or life-threatening deterioration.  Critical care was time spent personally by me on the following activities: development of treatment plan with patient and/or surrogate as well as nursing, discussions with consultants, evaluation of patient's response to treatment, examination of patient, obtaining history from patient or surrogate, ordering and performing treatments and interventions, ordering and review of laboratory studies, ordering and review of radiographic studies, pulse oximetry and re-evaluation of patient's condition.  Tessie Fass MSN, AGACNP-BC [facility] Amion for pager  02/02/2023, 10:36 AM

## 2023-02-02 NOTE — Progress Notes (Addendum)
Attempted to reach pt's daughter and sister via phone this morning but was unable to reach either. Contacted pt's RN and CSW to inquire if there are any other numbers to reach family and to request if daughter or family visit today to please contact navigator. Will continue efforts to reach family to discuss need for out-pt HD when pt stable for d/c from hospital. Will assist as needed.   Olivia Canter Renal Navigator [phone]  Addendum at 10:28 am: Met with pt and pt's sister, Share, at bedside. Introduced self and explained role. Discussed out-pt HD options- TCU due to interest in PD vs in-center. Pt and pt's family are to discuss options and navigator will f/u with pt/family tomorrow. HD options will depend on d/c plans as well. Should pt require rehab at snf at d/c, then TCU will not be an option. Will assist as needed.

## 2023-02-02 NOTE — Progress Notes (Signed)
All morning meds timed for 1000 given early per VO Dr. Chestine Spore, see MAR.

## 2023-02-03 ENCOUNTER — Encounter (HOSPITAL_COMMUNITY): Payer: Self-pay | Admitting: Critical Care Medicine

## 2023-02-03 DIAGNOSIS — B20 Human immunodeficiency virus [HIV] disease: Secondary | ICD-10-CM

## 2023-02-03 DIAGNOSIS — I7103 Dissection of thoracoabdominal aorta: Secondary | ICD-10-CM | POA: Diagnosis not present

## 2023-02-03 DIAGNOSIS — N179 Acute kidney failure, unspecified: Secondary | ICD-10-CM | POA: Diagnosis not present

## 2023-02-03 LAB — CBC
HCT: 24.4 % — ABNORMAL LOW (ref 36.0–46.0)
Hemoglobin: 8.6 g/dL — ABNORMAL LOW (ref 12.0–15.0)
MCH: 33.1 pg (ref 26.0–34.0)
MCHC: 35.2 g/dL (ref 30.0–36.0)
MCV: 93.8 fL (ref 80.0–100.0)
Platelets: 213 10*3/uL (ref 150–400)
RBC: 2.6 MIL/uL — ABNORMAL LOW (ref 3.87–5.11)
RDW: 16.7 % — ABNORMAL HIGH (ref 11.5–15.5)
WBC: 14.7 10*3/uL — ABNORMAL HIGH (ref 4.0–10.5)
nRBC: 0 % (ref 0.0–0.2)

## 2023-02-03 LAB — BASIC METABOLIC PANEL
Anion gap: 16 — ABNORMAL HIGH (ref 5–15)
BUN: 53 mg/dL — ABNORMAL HIGH (ref 6–20)
CO2: 20 mmol/L — ABNORMAL LOW (ref 22–32)
Calcium: 7.2 mg/dL — ABNORMAL LOW (ref 8.9–10.3)
Chloride: 93 mmol/L — ABNORMAL LOW (ref 98–111)
Creatinine, Ser: 8.45 mg/dL — ABNORMAL HIGH (ref 0.44–1.00)
GFR, Estimated: 5 mL/min — ABNORMAL LOW (ref >60)
Glucose, Bld: 145 mg/dL — ABNORMAL HIGH (ref 70–99)
Potassium: 4.1 mmol/L (ref 3.5–5.1)
Sodium: 129 mmol/L — ABNORMAL LOW (ref 135–145)

## 2023-02-03 LAB — TYPE AND SCREEN

## 2023-02-03 LAB — BPAM RBC
Blood Product Expiration Date: 202405172359
ISSUE DATE / TIME: 202404181601

## 2023-02-03 LAB — MAGNESIUM: Magnesium: 1.6 mg/dL — ABNORMAL LOW (ref 1.7–2.4)

## 2023-02-03 LAB — GLUCOSE, CAPILLARY
Glucose-Capillary: 120 mg/dL — ABNORMAL HIGH (ref 70–99)
Glucose-Capillary: 95 mg/dL (ref 70–99)
Glucose-Capillary: 132 mg/dL — ABNORMAL HIGH (ref 70–99)
Glucose-Capillary: 187 mg/dL — ABNORMAL HIGH (ref 70–99)
Glucose-Capillary: 196 mg/dL — ABNORMAL HIGH (ref 70–99)
Glucose-Capillary: 124 mg/dL — ABNORMAL HIGH (ref 70–99)

## 2023-02-03 LAB — PHOSPHORUS: Phosphorus: 9.9 mg/dL — ABNORMAL HIGH (ref 2.5–4.6)

## 2023-02-03 MED ORDER — MAGNESIUM SULFATE 2 GM/50ML IV SOLN
2.0000 g | Freq: Once | INTRAVENOUS | Status: AC
  Administered 2023-02-03: 2 g via INTRAVENOUS
  Filled 2023-02-03: qty 50

## 2023-02-03 MED ORDER — ASPIRIN 81 MG PO CHEW
81.0000 mg | CHEWABLE_TABLET | Freq: Every day | ORAL | Status: DC
  Administered 2023-02-03 – 2023-02-17 (×14): 81 mg
  Filled 2023-02-03 (×16): qty 1

## 2023-02-03 MED ORDER — OXYCODONE HCL 5 MG PO TABS: 5.0000 mg | ORAL_TABLET | Freq: Four times a day (QID) | ORAL | Status: DC | PRN

## 2023-02-03 MED ORDER — LIDOCAINE 5 % EX PTCH
1.0000 | MEDICATED_PATCH | CUTANEOUS | Status: DC
  Administered 2023-02-03 – 2023-02-20 (×14): 1 via TRANSDERMAL
  Filled 2023-02-03 (×15): qty 1

## 2023-02-03 MED ORDER — DOCUSATE SODIUM 50 MG/5ML PO LIQD
100.0000 mg | Freq: Every day | ORAL | Status: DC | PRN
  Administered 2023-02-10: 100 mg
  Filled 2023-02-03 (×2): qty 10

## 2023-02-03 MED ORDER — HEPARIN SODIUM (PORCINE) 1000 UNIT/ML IJ SOLN
INTRAMUSCULAR | Status: AC
  Administered 2023-02-03: 3800 [IU]
  Filled 2023-02-03: qty 4

## 2023-02-03 MED ORDER — VITAL 1.5 CAL PO LIQD: 1000.0000 mL | ORAL | Status: DC

## 2023-02-03 MED ORDER — TRAMADOL HCL 50 MG PO TABS
50.0000 mg | ORAL_TABLET | Freq: Two times a day (BID) | ORAL | Status: DC | PRN
  Administered 2023-02-04 – 2023-02-21 (×19): 50 mg via ORAL
  Filled 2023-02-03 (×22): qty 1

## 2023-02-03 NOTE — Progress Notes (Signed)
[name] Progress Note    Assessment/ Plan:   CKD 5, now ESRD -followed by our office as an outpatient -started HD due to uremic symptoms. S/p [facility] 4/15. IHD #1 4/16. HD #2 4/17. #3 today. Tentatively planning on her next HD to be on Monday but will monitor through the weekend -CLIP underway for outpatient placement -she is interested in PD apparently down the road, will hold off on AVF/AVG for now unless plan changes. Not sure if she would be a candidate for PD at this junction, discussed w/ VVS. Regardless, this is not the time to consider a more permanent access given acuity of her situation and in ICU -Follow daily labs, strict I/O -Avoid nephrotoxic medications including NSAIDs and iodinated intravenous contrast exposure unless the latter is absolutely indicated.  Preferred narcotic agents for pain control are hydromorphone, fentanyl, and methadone. Morphine should not be used. Avoid Baclofen and avoid oral sodium phosphate and magnesium citrate based laxatives / bowel preps. Continue strict Input and Output monitoring. Will monitor the patient closely with you and intervene or adjust therapy as indicated by changes in clinical status/labs   Type B dissection -VVS following. S/p TEVAR 4/18 -BP control as below  Encephlopathy -work up per primary service, will see if there is any improvement in mental status as she gets better clearance with HD (undergoing slow start protocol)  HTN -off esmolol, on cleviprex now -per CCM -will UF as tolerated  HIV -on HAART  DM -per primary service  Anthony Sar, MD  [facility]  Subjective:   Patient seen and examined bedside. About to start HD S/p tevar yesterday Discussed w/ vvs at bedside   Objective:   BP (!) 114/53   Pulse 67   Temp 98.4 F (36.9 C)   Resp 17   Ht  (1.651 m)   Wt 104.9 kg   LMP 03/31/2014 (LMP Unknown)   SpO2 96%   BMI 38.48 kg/m   Intake/Output Summary (Last 24 hours)  at 02/03/2023 0847 Last data filed at 02/03/2023 0800 Gross per 24 hour  Intake 1903.4 ml  Output 0 ml  Net 1903.4 ml   Weight change: 4.8 kg  Physical Exam: Gen: drowsy, laying flat in bed CVS: RRR Resp: CTA b/l Abd: soft, nt Ext: trace edema b/l LEs Neuro: drowsy but arousable to vocal stim Dialysis access: [facility]  Imaging: PERIPHERAL VASCULAR CATHETERIZATION  Result Date: 02/02/2023 See surgical note for result.  CT ANGIO AO+BIFEM W & OR WO CONTRAST  Result Date: 02/02/2023 CLINICAL DATA:  57 year old female with history of aortic dissection, first imaging diagnosis 01/29/2023 EXAM: CT ANGIOGRAPHY CHEST, ABDOMEN AND PELVIS TECHNIQUE: Multidetector CT imaging through the chest, abdomen and pelvis was performed using the standard protocol during bolus administration of intravenous contrast. Multiplanar reconstructed images and MIPs were obtained and reviewed to evaluate the vascular anatomy. RADIATION DOSE REDUCTION: This exam was performed according to the departmental dose-optimization program which includes automated exposure control, adjustment of the mA and/or kV according to patient size and/or use of iterative reconstruction technique. CONTRAST:  OMNIPAQUE IOHEXOL 350 MG/ML SOLN COMPARISON:  01/29/2023, 01/31/2023 FINDINGS: CTA CHEST FINDINGS Cardiovascular: Heart: Heart appears enlarged. No pericardial fluid/thickening. No significant coronary calcifications. Aorta: Non-gated CT angiogram. No significant calcifications of the aortic valve. Redemonstration of acute type B aortic dissection. The dissection flap again originates beyond left subclavian artery origin (zone 3). No evidence of retrograde extension. Estimated diameter of the ascending aorta 31 mm. Baseline CT during  the hyperacute phase 01/29/2023 demonstrates diameter in segment 5 estimated 42 mm by my measurement. Current diameter in segment 5 (image 85 of series 3) estimated 42 mm, unchanged. Estimated diameter at  the hiatus on the baseline CT 42 mm. Current diameter at the hiatus (image 134 of series 3) is estimated 42 mm, unchanged. The true lumen is partially fenestrated within the zone 4 and zone 5 of the aorta. True lumen is towards the patient's right at the hiatus. False lumen is partially thrombosed in zone 4 and 5. The false lumen is thrombosed at the aortic arch/zone 3. No periaortic fluid or inflammatory changes. Greatest estimated diameter of the aortic annulus 26 mm on the coronal reformatted images. Greatest estimated diameter of the sino-tubular junction, 24 mm on the coronal images. Pulmonary arteries: Timing of the contrast bolus is not optimized for evaluation of pulmonary artery filling defects. No central filling defect or filling defect within the left or right pulmonary arteries. Segmental and subsegmental pulmonary arteries are poorly characterized. Diameter of the main pulmonary artery unremarkable. Mediastinum/Nodes: No mediastinal adenopathy. Unremarkable appearance of the thoracic esophagus. Unremarkable appearance of the thoracic inlet. Lungs/Pleura: Low lung volumes. Atelectasis at the lung bases again demonstrated. Trace bilateral pleural effusion Central venous catheter again noted from right IJ approach. CTA ABDOMEN AND PELVIS FINDINGS VASCULAR Aorta: Similar appearance of the acute type B dissection, with extension of the flap through the abdominal aorta, terminating at the bifurcation. Diameter at the Visceral aorta is unchanged, estimated 33 mm. Juxtarenal aortic diameter is unchanged, estimated 24 mm. Infrarenal abdominal aorta without aneurysm. The true lumen is towards the patient's right. The flap crosses the celiac artery, which remains perfused. True lumen perfuses the SMA. True lumen perfuses the right renal artery. The false lumen perfuses the left renal artery. False lumen perfuses the IMA. Celiac: Dissection flap crosses the celiac artery origin. The true lumen perfuses the celiac  artery with branch vessels patent. SMA: SMA is patent, perfused from the true lumen. Branch vessels are patent. Renals: - Right: The single right renal artery is perfused from the true lumen. Mild atherosclerotic changes within the renal artery. True lumen diameter is narrowed at the level of the right renal artery origin. - Left: Single left renal artery. The left renal artery is perfused from the false channel. IMA: IMA is perfused from the false lumen.  IMA remains patent. Right lower extremity: Mild atherosclerotic changes of the right common iliac artery, hypogastric artery, external iliac artery. The dissection flap does not enter the right iliac system. No high-grade stenosis or occlusion. Common femoral artery is patent with minimal atherosclerosis. Profunda femoris and the thigh arteries are patent. Superficial femoral artery is patent throughout its length with minimal atherosclerosis. Popliteal artery patent. No significant atherosclerosis. Typical trifurcation. All 3 tibial arteries are patent from the origin to the ankle. Posterior tibial artery is a small caliber artery throughout its length. Left lower extremity: The dissection flap traverses the length of the left iliac system, including common iliac artery and external iliac artery. The dissection flap appears to terminate in the proximal SFA. Left hypogastric artery is occluded at the origin from the dissection flap. Common femoral artery is patent. Profunda femoris and the thigh branches patent. Superficial femoral artery patent throughout its length with no significant atherosclerotic changes. Popliteal artery patent. Typical trifurcation. All 3 tibial arteries are patent from the origin to the ankle with no significant tibial arterial disease. Veins: Unremarkable appearance of the venous system. Review of  the MIP images confirms the above findings. NON-VASCULAR Hepatobiliary: Relatively unremarkable appearance of liver. High density material in  the gallbladder, likely vicarious contrast excretion. Evidence of cholelithiasis noncalcified. Pancreas: Unremarkable. Spleen: Unremarkable. Adrenals/Urinary Tract: - Right adrenal gland: Right adrenal gland thickening. - Left adrenal gland: Left adrenal gland thickening. - Right kidney: Delayed right nephrogram, with some patchy enhancement likely related to poor flow in the setting of dissection flap. Retained contrast within the collecting system. No hydronephrosis. Renal cortical thinning. - Left Kidney: Delayed left nephrogram, with some patchy enhancement likely related to poor flow in the setting of the dissection flap. Retained contrast in the collecting system. Renal cortical thinning, though not as pronounced as the right. - Urinary Bladder: Excreted contrast within the urinary bladder. Stomach/Bowel: - Stomach: Gastric tube in place.  Otherwise unremarkable stomach. - Small bowel: Unremarkable bowel. The early arterial phase limits evaluation for decreased bowel blood flow, however, there is no distension or hypodensity within the bowel loops. - Appendix: Normal. - Colon: Oral contrast within the colon. Left-sided diverticular disease. No inflammatory changes. Lymphatic: No adenopathy. Mesenteric: No free fluid or air. No mesenteric adenopathy. Reproductive: Unremarkable appearance of the pelvic organs. Other: Fat containing umbilical hernia.  Body wall edema/anasarca. Musculoskeletal: No evidence of acute fracture. No bony canal narrowing. No significant degenerative changes of the hips. IMPRESSION: Similar appearance of acute type B dissection, baseline imaging 01/29/2023. Relatively unchanged diameter of the thoracic aorta (maximum estimated 4.2 cm as above), relatively unchanged perfusion of true lumen and false lumen (as above), and maintained in-line flow to the bilateral ankles without evidence of acute femoropopliteal or tibial occlusion. Known dissection flap extending through the left iliac  system, now with collapse of the true lumen and interval occlusion/thrombosis of the left internal iliac artery. No evidence of retrograde extension of the dissection. Bilateral delayed nephrogram of the kidneys, indicating compromised flow from either the presence of dissection flap at the artery lumen (left) and/or dynamic obstruction (on the right). Mesenteric arteries maintained perfusion. Additional ancillary findings as above. Signed, Yvone Neu. Miachel Roux, RPVI [facility] Radiology Electronically Signed   By: Gilmer Mor D.O.   On: 02/02/2023 15:02   HYBRID OR IMAGING ([facility] ONLY)  Result Date: 02/02/2023 There is no interpretation for this exam.  This order is for images obtained during a surgical procedure.  Please See "Surgeries" Tab for more information regarding the procedure.   CT ANGIO CHEST AORTA W/CM &/OR WO/CM  Result Date: 02/02/2023 CLINICAL DATA:  Type B aortic dissection. EXAM: CT ANGIOGRAPHY CHEST WITH CONTRAST TECHNIQUE: Multidetector CT imaging of the chest was performed using the standard protocol during bolus administration of intravenous contrast. Multiplanar CT image reconstructions and MIPs were obtained to evaluate the vascular anatomy. RADIATION DOSE REDUCTION: This exam was performed according to the departmental dose-optimization program which includes automated exposure control, adjustment of the mA and/or kV according to patient size and/or use of iterative reconstruction technique. CONTRAST:  OMNIPAQUE IOHEXOL 350 MG/ML SOLN COMPARISON:  Prior CT scan 01/29/2023 and 01/31/2023 FINDINGS: Cardiovascular: Stable mild cardiac enlargement but no pericardial effusion. Stable type B aortic dissection involving the descending thoracic aorta. No involvement of the arch vessels or ascending aorta. Scattered atherosclerotic calcifications. No definite coronary artery calcifications. Right IJ catheter is stable. Maximum  diameter of the descending thoracic aorta is stable is stable measuring 3.9 x 3.8 cm. Mediastinum/Nodes: Feeding tube is coursing down the esophagus and into the stomach. No esophageal abnormality.  Scattered mediastinal and hilar lymph nodes are stable. Lungs/Pleura: Moderate vascular congestion and suspected pulmonary edema with small pleural effusions and bibasilar atelectasis. Upper Abdomen: The aortic branch vessels are patent but there is persistent marked irregularity involving the proximal left renal artery. No significant upper abdominal findings. Musculoskeletal: No significant bony findings. Review of the MIP images confirms the above findings. IMPRESSION: 1. Stable type B aortic dissection involving the descending thoracic aorta. No involvement of the arch vessels or ascending aorta. 2. Stable mild cardiac enlargement. 3. Moderate vascular congestion and suspected pulmonary edema with small pleural effusions and bibasilar atelectasis. 4. Persistent marked irregularity involving the proximal left renal artery but no occlusion. Electronically Signed   By: Rudie Meyer M.D.   On: 02/02/2023 13:00   MR BRAIN WO CONTRAST  Result Date: 02/01/2023 CLINICAL DATA:  Delirium, acute encephalopathy EXAM: MRI HEAD WITHOUT CONTRAST TECHNIQUE: Multiplanar, multiecho pulse sequences of the brain and surrounding structures were obtained without intravenous contrast. COMPARISON:  None Available. FINDINGS: Brain: No restricted diffusion to suggest acute or subacute infarct. No acute hemorrhage, mass, mass effect, or midline shift. No hydrocephalus or extra-axial collection. Normal pituitary and craniocervical junction. No hemosiderin deposition to suggest remote hemorrhage. Cerebral volume is within normal limits for age. Diffusely dilated perivascular spaces, most prominent in the cortex near the vertex (series 15, image 14). Vascular: Normal arterial flow voids. Skull and upper cervical spine: Normal marrow signal.  Sinuses/Orbits: Clear paranasal sinuses. No acute finding in the orbits. Other: The mastoid air cells are well aerated. IMPRESSION: 1. No acute intracranial process. 2. Diffusely dilated perivascular spaces. Electronically Signed   By: Wiliam Ke M.D.   On: 02/01/2023 18:06   DG Abd Portable 1V  Result Date: 02/01/2023 CLINICAL DATA:  Encounter for feeding tube placement. EXAM: PORTABLE ABDOMEN - 1 VIEW COMPARISON:  Chest x-ray on 10/13/2020, abdominal CT on 01/31/2023 FINDINGS: Feeding tube has been placed, tip overlying the level of the distal stomach. Visualized bowel gas pattern is nonobstructive. Patient has a RIGHT IJ central line, tip overlying the level of the LOWER superior vena cava. IMPRESSION: Feeding tube tip overlying the level of the distal stomach. Electronically Signed   By: Norva Pavlov M.D.   On: 02/01/2023 14:35    Labs: BMET Recent Labs  Lab 01/28/23 2358 01/30/23 0355 02/01/23 8119 02/02/23 0423 02/02/23 0854 02/03/23 0419  NA 139 132* 129*  --  128* 129*  K 3.7 4.4 4.4  --  4.0 4.1  CL 102 98 94*  --  91* 93*  CO2 24 20* 17*  --  21* 20*  GLUCOSE 156* 120* 150*  --  168* 145*  BUN 54* 60* 58*  --  45* 53*  CREATININE 5.99* 7.44* 8.70*  --  7.71* 8.45*  CALCIUM 9.3 7.8* 7.4*  --  7.5* 7.2*  PHOS  --   --  9.0* 7.6*  --  9.9*   CBC Recent Labs  Lab 01/28/23 2358 01/30/23 0355 02/02/23 1849 02/03/23 0419  WBC 14.2* 12.6* 13.3* 14.7*  HGB 8.7* 7.2* 8.8* 8.6*  HCT 25.7* 22.1* 25.4* 24.4*  MCV 97.0 101.8* 94.8 93.8  PLT 180 175 219 213    Medications:     amLODipine  10 mg Per Tube Daily   aspirin  81 mg Per Tube Daily   carvedilol  25 mg Per Tube BID   Chlorhexidine Gluconate Cloth  6 each Topical Daily   Chlorhexidine Gluconate Cloth  6 each Topical Q0600  dolutegravir  50 mg Per Tube Daily   emtricitabine-tenofovir AF  1 tablet Per Tube Daily   feeding supplement (PROSource TF20)  60 mL Per Tube Daily   heparin  5,000 Units Subcutaneous  Q8H   heparin sodium (porcine)       hydrALAZINE  100 mg Per Tube Q8H   insulin aspart  0-15 Units Subcutaneous Q4H   lactulose  10 g Per Tube TID   levothyroxine  175 mcg Per Tube Q0600   losartan  100 mg Per Tube Daily   multivitamin  1 tablet Per Tube QHS   pantoprazole  40 mg Oral Daily   rosuvastatin  10 mg Per Tube Daily   sertraline  50 mg Per Tube Daily      Anthony Sar, MD [facility] 02/03/2023, 8:47 AM

## 2023-02-03 NOTE — Progress Notes (Addendum)
  Progress Note    02/03/2023 7:58 AM 1 Day Post-Op  Subjective:  somewhat somnolent   Vitals:   02/03/23 0645 02/03/23 0700  BP:    Pulse: 68 69  Resp: 17 (!) 29  Temp:    SpO2: 96% 95%   Physical Exam: Lungs:  non labored Incisions:  R groin incision c/di without hematoma Extremities:  symmetrical DP pulses Neurologic: A&O; sensation intact both feet; able to lift both knees off the bed with hip flexion  CBC    Component Value Date/Time   WBC 14.7 (H) 02/03/2023 0419   RBC 2.60 (L) 02/03/2023 0419   HGB 8.6 (L) 02/03/2023 0419   HGB 11.7 (L) 07/27/2021 1110   HCT 24.4 (L) 02/03/2023 0419   PLT 213 02/03/2023 0419   PLT 228 07/27/2021 1110   MCV 93.8 02/03/2023 0419   MCH 33.1 02/03/2023 0419   MCHC 35.2 02/03/2023 0419   RDW 16.7 (H) 02/03/2023 0419   LYMPHSABS 1.6 12/19/2022 1242   MONOABS 0.7 12/19/2022 1242   EOSABS 0.2 12/19/2022 1242   BASOSABS 0.0 12/19/2022 1242    BMET    Component Value Date/Time   NA 129 (L) 02/03/2023 0419   NA 142 12/05/2022 0000   K 4.1 02/03/2023 0419   CL 93 (L) 02/03/2023 0419   CO2 20 (L) 02/03/2023 0419   GLUCOSE 145 (H) 02/03/2023 0419   BUN 53 (H) 02/03/2023 0419   BUN 44 (A) 12/05/2022 0000   CREATININE 8.45 (H) 02/03/2023 0419   CREATININE 2.72 (H) 07/27/2021 1110   CREATININE 2.74 (H) 03/17/2021 1050   CALCIUM 7.2 (L) 02/03/2023 0419   GFRNONAA 5 (L) 02/03/2023 0419   GFRNONAA 20 (L) 07/27/2021 1110   GFRAA 39 (L) 07/14/2020 1053   GFRAA 33 (L) 08/12/2019 1447    INR    Component Value Date/Time   INR 1.0 07/14/2019 0312     Intake/Output Summary (Last 24 hours) at 02/03/2023 0758 Last data filed at 02/03/2023 0700 Gross per 24 hour  Intake 1945.4 ml  Output 0 ml  Net 1945.4 ml     Assessment/Plan:  57 y.o. female is s/p TEVAR for type b aortic dissection 1 Day Post-Op   BLE well perfused with symmetrical 2+ DP pulses R groin incision is well appearing without hematoma Motor and sensation  symmetrical BLE; continue MAP >80; continue neurovascular checks; she will need spinal drain placement if exam changes Ok to get OOB   Emilie Rutter, PA-C [facility] [phone] 02/03/2023 7:58 AM  VASCULAR STAFF ADDENDUM: I have independently interviewed and examined the patient. I agree with the above.  Looks better POD#1 TEVAR for TBAD with malperfusion Reassuring exam overall. Continue neuro-vascular checks through the weekend. We will continue to follow closely.  Rande Brunt. Lenell Antu, MD [facility] Phone Number: [phone] 02/03/2023 12:02 PM

## 2023-02-03 NOTE — Progress Notes (Signed)
NAME:  [name], MRN:  [id number], DOB:  Oct 04, 1966, LOS: 5 ADMISSION DATE:  01/29/2023, CONSULTATION DATE:  01/29/23 REFERRING MD:  EDP, CHIEF COMPLAINT:  aortic dissection   History of Present Illness:  57 yo female with pmh t2dm, ckd4, hiv, sarcoidosis who prsented to [facility] with complaints of back and abdominal pain with nausea and non bloody vomiting. Her family at bedside endorse this has been ongoing for 2 days not. Family provides most history as pt is quite drowsy after pain medication. The state that pt had no other associated symptoms. She had been trying to treat herself for consitpation thinking that was the cause of her pain. No fevers/ chills. No diarrhea. No recent change in medications.   She presented with elevated BP. Systolics >200. She was started on esmolol and nitro gtts and vascular is coming to eval.   Pertinent  Medical History  T2dm Htn Hyperlipidemia Hiv Hypothyroidism ckd4  Significant Hospital Events: Including procedures, antibiotic start and stop dates in addition to other pertinent events   Admitted to hospital 4/14, on cardene and esmolol 4/15 TDC placement, uremic encephalopathy 4/18 CT angio c/a/b, AO bifem. TEVAR  4/19   Interim History / Subjective:   TEVAR yesterday  NAEO  Objective   Blood pressure (!) 125/59, pulse 75, temperature 98.4 F (36.9 C), resp. rate 14, height  (1.651 m), weight 104.9 kg, last menstrual period 03/31/2014, SpO2 98 %.        Intake/Output Summary (Last 24 hours) at 02/03/2023 0953 Last data filed at 02/03/2023 0800 Gross per 24 hour  Intake 1862.43 ml  Output 0 ml  Net 1862.43 ml   Filed Weights   02/01/23 1020 02/02/23 0500 02/03/23 0500  Weight: 99 kg 102 kg 104.9 kg    Examination: General: obese ill appearing middle aged F  HENT: NCAT  Lungs: CTAb  Cardiovascular: s1s2. Palpable pedal pulses intact sensation  Abdomen: obese soft  MSK: no acute joint deformity. R groin site  without hematoma   Neuro: Awake following commands oriented  Derm: c/d/w   Resolved Hospital Problem list     Assessment & Plan:   Type B dissection s/p TEVAR (4/18) -- proximal descending thoracic aorta distal to origin of L subclav to iliacs, extending LLE to bifurcation of superficial and profunda fem arteries  P -VVS following -MAP goal > 80  -cont fq neurovasc checks. If change in exam would need drain -coreg  BID, amlodipine  qD, hydral  q8, losartan  qD -- hold if needed for MAP goal   HTN  -hemodynamic goals as above   Acute encephalopathy  -variable. Seems like for some providers is answering yes/no in inappropriate pattern, for me is Ox3, then confused  -MRI without watershed  P -delirium precautions  -lactulose   AKI on CKD 5 -- now ESRD  Hyponatremia  P -iHD per nephro  DM2   -SSI   HIV; well controlled recently with undetectable VL and CD4 685 in 12/2022 -cont tivicay, descovy  Hypothyroidism -synthroid   GERD -PPI  Poor PO intake  Nausea P -EN -PRN antiemetic  -meds per tube    Best Practice (right click and "Reselect all SmartList Selections" daily)   Diet/type: Regular consistency (see orders)- EN DVT prophylaxis: SCD GI prophylaxis: PPI Lines: Dialysis Catheter Foley:  N/A Code Status:  full code Last date of multidisciplinary goals of care discussion [sister updated 4/16]  Labs   CBC: Recent Labs  Lab 01/28/23 2358 01/30/23 0355  02/02/23 1849 02/03/23 0419  WBC 14.2* 12.6* 13.3* 14.7*  HGB 8.7* 7.2* 8.8* 8.6*  HCT 25.7* 22.1* 25.4* 24.4*  MCV 97.0 101.8* 94.8 93.8  PLT 180 175 219 213    Basic Metabolic Panel: Recent Labs  Lab 01/28/23 2358 01/30/23 0355 02/01/23 0633 02/02/23 0423 02/02/23 0854 02/03/23 0419  NA 139 132* 129*  --  128* 129*  K 3.7 4.4 4.4  --  4.0 4.1  CL 102 98 94*  --  91* 93*  CO2 24 20* 17*  --  21* 20*  GLUCOSE 156* 120* 150*  --  168* 145*  BUN 54* 60* 58*  --  45* 53*   CREATININE 5.99* 7.44* 8.70*  --  7.71* 8.45*  CALCIUM 9.3 7.8* 7.4*  --  7.5* 7.2*  MG  --   --   --  1.8  --  1.6*  PHOS  --   --  9.0* 7.6*  --  9.9*   GFR: Estimated Creatinine Clearance: 9.2 mL/min (A) (by C-G formula based on SCr of 8.45 mg/dL (H)). Recent Labs  Lab 01/28/23 2358 01/30/23 0355 01/31/23 1849 02/02/23 1849 02/03/23 0419  WBC 14.2* 12.6*  --  13.3* 14.7*  LATICACIDVEN  --   --  0.9  --   --      CRITICAL CARE Performed by: Lanier Clam   Total critical care time: 42 minutes  Critical care time was exclusive of separately billable procedures and treating other patients. Critical care was necessary to treat or prevent imminent or life-threatening deterioration.  Critical care was time spent personally by me on the following activities: development of treatment plan with patient and/or surrogate as well as nursing, discussions with consultants, evaluation of patient's response to treatment, examination of patient, obtaining history from patient or surrogate, ordering and performing treatments and interventions, ordering and review of laboratory studies, ordering and review of radiographic studies, pulse oximetry and re-evaluation of patient's condition.  Tessie Fass MSN, AGACNP-BC [facility] Pulmonary/Critical Care Medicine Amion for pager  02/03/2023, 9:53 AM

## 2023-02-03 NOTE — Procedures (Signed)
HD Note:  Some information was entered later than the data was gathered due to patient care needs. The stated time with the data is accurate.Resident treatment completed at .  Alert and oriented.  Informed consent signed and in chart.   TX duration: 3 hours  Patient tolerated well. MAP orders from Vascular were to keep above 80.  UF turned off a few minutes prior to the end of   Alert, without acute distress.  Hand-off given to patient's nurse.   Access used: Right temp IJ HD catheter Access issues: None  Total UF removed: 1900 ml    [facility]

## 2023-02-03 NOTE — Anesthesia Postprocedure Evaluation (Signed)
Anesthesia Post Note  Patient: [name]  Procedure(s) Performed: THORACIC AORTIC ENDOVASCULAR STENT GRAFT     Patient location during evaluation: PACU Anesthesia Type: General Level of consciousness: awake Pain management: pain level controlled Vital Signs Assessment: post-procedure vital signs reviewed and stable Respiratory status: spontaneous breathing, nonlabored ventilation and respiratory function stable Cardiovascular status: blood pressure returned to baseline and stable Postop Assessment: no apparent nausea or vomiting Anesthetic complications: no   No notable events documented.  Last Vitals:  Vitals:   02/03/23 1800 02/03/23 1900  BP:    Pulse: 74 76  Resp: 14 17  Temp:  (!) 36.4 C  SpO2: 94% 98%    Last Pain:  Vitals:   02/03/23 1900  TempSrc: Oral  PainSc:    Pain Goal: Patients Stated Pain Goal: 0 (02/01/23 1426)                 Katelyn Broadnax P Lyndsey Demos

## 2023-02-03 NOTE — Progress Notes (Signed)
Nutrition Follow-up  DOCUMENTATION CODES:   Not applicable  INTERVENTION:   Tube Feeding via Cortrak: Increase TF to meet 75% of nutritional needs Vital 1.5 at 40 ml/hr This provides 1440 kcals, 65 g of protein and 730 mL of free water  Recommend Dysphagia 3 with 1200 mL; if tolerates oral intake, plan to add oral nutrition supplement with goal to remove Cortrak  Continue Renal MVI   NUTRITION DIAGNOSIS:   Inadequate oral intake related to acute illness, lethargy/confusion as evidenced by NPO status.  Being addressed  GOAL:   Patient will meet greater than or equal to 90% of their needs  Progressing  MONITOR:   Diet advancement, TF tolerance, Labs, Weight trends  REASON FOR ASSESSMENT:   Rounds    ASSESSMENT:   57 yo female admitted with Type B aortic dissection, AKI on CKD 4-progressed to ESRD and iHD initiated. PMH DM, CKD 4, HIV, sarcoidosis, HTN, HLD  4/15 TDC placed 4/16 1st iHD 4/17 2nd iHD, Cortrak placed, trickle TF 4/18 OR: thoracic, mesenteric, infrarenal abdominal angiograms, thoracic endovascular aortic repair 4/19 3rd iHD  Received iHD this AM  Mentation greatly improved today, remains on CL. Tolerating trickle TF via Cortrak. TF off yesterday for surgery. Discussed diet advancement with PCCM who are ok to advance  +liquid stool but currently on lactulose for elevated ammonia  Weight up to 104.9 kg post OR yesterday, wt preop around 102 kg. Admit wt around 96.2 kg. Net +8 L since admission. Remains anuric  Labs: sodium 129 (L), phosphorus 9.9 (H),  Meds: lactulose, Rena-vite, ss novolog, mag sulfate, tivicay  Diet Order:   Diet Order             DIET DYS 3 Room service appropriate? Yes; Fluid consistency: Thin; Fluid restriction: 1200 mL Fluid  Diet effective now                   EDUCATION NEEDS:   Not appropriate for education at this time  Skin:  Skin Assessment: Reviewed RN Assessment  Last BM:  4/19  Height:   Ht  Readings from Last 1 Encounters:  01/28/23  (1.651 m)    Weight:   Wt Readings from Last 1 Encounters:  02/03/23 104.9 kg     BMI:  Body mass index is 38.48 kg/m.  Estimated Nutritional Needs:   Kcal:  1750-1950 kcals  Protein:  75-85 g  Fluid:  1L plus UOP    Romelle Starcher MS, RDN, LDN, CNSC Registered Dietitian 3 Clinical Nutrition RD Pager and On-Call Pager Number Located in [place]

## 2023-02-04 ENCOUNTER — Encounter (HOSPITAL_COMMUNITY): Payer: Self-pay | Admitting: Vascular Surgery

## 2023-02-04 DIAGNOSIS — I7102 Dissection of abdominal aorta: Secondary | ICD-10-CM | POA: Diagnosis not present

## 2023-02-04 LAB — COMPREHENSIVE METABOLIC PANEL
ALT: 6 U/L (ref 0–44)
AST: 19 U/L (ref 15–41)
Albumin: 2.2 g/dL — ABNORMAL LOW (ref 3.5–5.0)
Alkaline Phosphatase: 69 U/L (ref 38–126)
Anion gap: 11 (ref 5–15)
BUN: 39 mg/dL — ABNORMAL HIGH (ref 6–20)
CO2: 24 mmol/L (ref 22–32)
Calcium: 7.4 mg/dL — ABNORMAL LOW (ref 8.9–10.3)
Chloride: 94 mmol/L — ABNORMAL LOW (ref 98–111)
Creatinine, Ser: 6.68 mg/dL — ABNORMAL HIGH (ref 0.44–1.00)
GFR, Estimated: 7 mL/min — ABNORMAL LOW (ref >60)
Glucose, Bld: 169 mg/dL — ABNORMAL HIGH (ref 70–99)
Potassium: 3.6 mmol/L (ref 3.5–5.1)
Sodium: 129 mmol/L — ABNORMAL LOW (ref 135–145)
Total Bilirubin: 0.3 mg/dL (ref 0.3–1.2)
Total Protein: 6.5 g/dL (ref 6.5–8.1)

## 2023-02-04 LAB — CBC
HCT: 23.4 % — ABNORMAL LOW (ref 36.0–46.0)
Hemoglobin: 8.3 g/dL — ABNORMAL LOW (ref 12.0–15.0)
MCH: 33.5 pg (ref 26.0–34.0)
MCHC: 35.5 g/dL (ref 30.0–36.0)
MCV: 94.4 fL (ref 80.0–100.0)
Platelets: 195 10*3/uL (ref 150–400)
RBC: 2.48 MIL/uL — ABNORMAL LOW (ref 3.87–5.11)
RDW: 16.6 % — ABNORMAL HIGH (ref 11.5–15.5)
WBC: 18.7 10*3/uL — ABNORMAL HIGH (ref 4.0–10.5)
nRBC: 0 % (ref 0.0–0.2)

## 2023-02-04 LAB — GLUCOSE, CAPILLARY
Glucose-Capillary: 143 mg/dL — ABNORMAL HIGH (ref 70–99)
Glucose-Capillary: 160 mg/dL — ABNORMAL HIGH (ref 70–99)
Glucose-Capillary: 165 mg/dL — ABNORMAL HIGH (ref 70–99)
Glucose-Capillary: 170 mg/dL — ABNORMAL HIGH (ref 70–99)
Glucose-Capillary: 171 mg/dL — ABNORMAL HIGH (ref 70–99)
Glucose-Capillary: 192 mg/dL — ABNORMAL HIGH (ref 70–99)

## 2023-02-04 LAB — PHOSPHORUS: Phosphorus: 6 mg/dL — ABNORMAL HIGH (ref 2.5–4.6)

## 2023-02-04 LAB — MAGNESIUM: Magnesium: 2.1 mg/dL (ref 1.7–2.4)

## 2023-02-04 MED ORDER — ORAL CARE MOUTH RINSE: 15.0000 mL | OROMUCOSAL | Status: DC | PRN

## 2023-02-04 MED ORDER — ORAL CARE MOUTH RINSE
15.0000 mL | OROMUCOSAL | Status: DC
  Administered 2023-02-04 – 2023-02-21 (×56): 15 mL via OROMUCOSAL

## 2023-02-04 MED ORDER — VITAL 1.5 CAL PO LIQD
1000.0000 mL | ORAL | Status: DC
  Administered 2023-02-06: 1000 mL

## 2023-02-04 NOTE — Evaluation (Signed)
Physical Therapy Evaluation Patient Details Name: [name] MRN: [id number] DOB: 1966-05-26 Today's Date: 02/04/2023  History of Present Illness  57 y.o. 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 S/p 4/18 thoracic endovascular aortic repair. PMH:t2dm, ckd4, hiv, sarcoidosis  Clinical Impression  PTA pt living alone in 2 story town home with 2 steps to enter and bed and bath upstairs. Pt independent community ambulator prior to hospitalization. Pt is limited in mobility today by increased back pain and lethargy from pain medication, as well as generalized strength and balance deficits. Given PLOF with continued acute rehab and increased family support pt may be able to go home with HHPT. PT will continue to follow acutely towards that goal.     Recommendations for follow up therapy are one component of a multi-disciplinary discharge planning process, led by the attending physician.  Recommendations may be updated based on patient status, additional functional criteria and insurance authorization.     Assistance Recommended at Discharge Frequent or constant Supervision/Assistance  Patient can return home with the following  A little help with walking and/or transfers;A little help with bathing/dressing/bathroom;Assistance with cooking/housework;Assist for transportation;Help with stairs or ramp for entrance    Equipment Recommendations Rolling walker (2 wheels);BSC/3in1  Recommendations for Other Services  OT consult    Functional Status Assessment Patient has had a recent decline in their functional status and demonstrates the ability to make significant improvements in function in a reasonable and predictable amount of time.     Precautions / Restrictions Precautions Precaution Comments: Cortrak, Restrictions Weight Bearing  Restrictions: No      Mobility  Bed Mobility Overal bed mobility: Needs Assistance Bed Mobility: Rolling, Sidelying to Sit Rolling: Min assist Sidelying to sit: Mod assist       General bed mobility comments: min A for facilitating reaching with UE, mod A for bringing trunk to upright and pad scoot of hips to EoB    Transfers Overall transfer level: Needs assistance Equipment used: Rolling walker (2 wheels) Transfers: Sit to/from Stand, Bed to chair/wheelchair/BSC Sit to Stand: Min assist, From elevated surface, +2 safety/equipment   Step pivot transfers: Min assist, +2 safety/equipment       General transfer comment: min physical assist and maximal cuing for hand placement for power up to standing, minA for RW managment and maximal cuing for sequencing for stepping to chair    Ambulation/Gait               General Gait Details: deferred due to lethargy, encouraged ambulation with staff when less drowsy        Balance Overall balance assessment: Needs assistance Sitting-balance support: Feet supported, Bilateral upper extremity supported Sitting balance-Leahy Scale: Fair     Standing balance support: Bilateral upper extremity supported, During functional activity, Reliant on assistive device for balance Standing balance-Leahy Scale: Poor                               Pertinent Vitals/Pain Pain Assessment Pain Assessment: 0-10 Pain Location: back with coughing Pain Descriptors / Indicators: Sharp Pain Intervention(s): Limited activity within patient's tolerance, Monitored during session, Premedicated before session, Repositioned    Home Living Family/patient expects to be discharged to:: Private residence Living Arrangements: Alone Available Help at Discharge: Family;Available PRN/intermittently  Type of Home: House Home Access: Stairs to enter Entrance Stairs-Rails: None Entrance Stairs-Number of Steps: 2 Alternate Level Stairs-Number of  Steps: 10 Home Layout: Two level;Bed/bath upstairs;1/2 bath on main level Home Equipment: None      Prior Function Prior Level of Function : Independent/Modified Independent;Driving             Mobility Comments: community ambulator             Extremity/Trunk Assessment   Upper Extremity Assessment Upper Extremity Assessment: Overall WFL for tasks assessed    Lower Extremity Assessment Lower Extremity Assessment: Overall WFL for tasks assessed       Communication   Communication: No difficulties  Cognition Arousal/Alertness: Lethargic, Suspect due to medications Behavior During Therapy: Flat affect Overall Cognitive Status: Within Functional Limits for tasks assessed                                 General Comments: pt very flat, likely due to pain medication before therapy session        General Comments General comments (skin integrity, edema, etc.): Pt on 3L O2 via [place], SpO2 >90%O2 throughout. pt with c/o slight dizziness, BP sitting 117/61, standing 136/101, sitting in recliner 125/63, HR 70-80s        Assessment/Plan    PT Assessment Patient needs continued PT services  PT Problem List Decreased strength;Decreased activity tolerance;Decreased balance;Decreased mobility;Decreased knowledge of use of DME;Pain       PT Treatment Interventions DME instruction;Gait training;Stair training;Functional mobility training;Therapeutic activities;Therapeutic exercise;Balance training;Cognitive remediation;Patient/family education    PT Goals (Current goals can be found in the Care Plan section)  Acute Rehab PT Goals Patient Stated Goal: go home PT Goal Formulation: With patient Time For Goal Achievement: 02/18/23 Potential to Achieve Goals: Good    Frequency Min 1X/week        AM-PAC PT "6 Clicks" Mobility  Outcome Measure Help needed turning from your back to your side while in a flat bed without using bedrails?: A Little Help needed  moving from lying on your back to sitting on the side of a flat bed without using bedrails?: A Little Help needed moving to and from a bed to a chair (including a wheelchair)?: A Lot Help needed standing up from a chair using your arms (e.g., wheelchair or bedside chair)?: A Lot Help needed to walk in hospital room?: A Lot Help needed climbing 3-5 steps with a railing? : Total 6 Click Score: 13    End of Session Equipment Utilized During Treatment: Gait belt Activity Tolerance: Patient limited by lethargy Patient left: in chair;with call bell/phone within reach Nurse Communication: Mobility status PT Visit Diagnosis: Difficulty in walking, not elsewhere classified (R26.2);Muscle weakness (generalized) (M62.81);Pain Pain - part of body:  (back)    Time: 9147-8295 PT Time Calculation (min) (ACUTE ONLY): 25 min   Charges:   PT Evaluation $PT Eval Moderate Complexity: 1 Mod PT Treatments $Therapeutic Activity: 8-22 mins        [name] B. Beverely Risen PT, DPT Acute Rehabilitation Services Please use secure chat or  Call Office [phone]   [name] Fleet 02/04/2023, 10:10 AM

## 2023-02-04 NOTE — Progress Notes (Deleted)
PT Cancellation Note  Patient Details Name: [name] MRN: [id number] DOB: 1966/01/25   Cancelled Treatment:    Reason Eval/Treat Not Completed: (P) Patient not medically ready Pt continues to be on BiPAP. RN request hold therapy today. PT will follow back for Evaluation tomorrow.  Carel Carrier B. Beverely Risen PT, DPT Acute Rehabilitation Services Please use secure chat or  Call Office [phone]    [name] [facility] 02/04/2023, 7:49 AM

## 2023-02-04 NOTE — Progress Notes (Signed)
Brief Nutrition Note  New consult received for diet education for pt with new ESRD on HD.  Pt already being followed by inpatient RD team and last assessment completed 4/19. Pt is critically ill in the ICU, currently receiving the bulk of her nutrition from TF via cortrak tube with diet advancement having occurred yesterday. Not appropriate for diet education at this time but will address as pt approaches discharge.  For now, will add renal diet education to AVS and plan to follow-up with in-person education when pt is medically stable and receiving the bulk of her nutrition orally through a PO diet.  Greig Castilla, RD, LDN Clinical Dietitian RD pager # available in [facility]  After hours/weekend pager # available in [facility]

## 2023-02-04 NOTE — Progress Notes (Signed)
NAME:  [name], MRN:  [id number], DOB:  02-29-1966, LOS: 6 ADMISSION DATE:  01/29/2023, CONSULTATION DATE:  01/29/23 REFERRING MD:  EDP, CHIEF COMPLAINT:  aortic dissection   History of Present Illness:  57 yo female with pmh t2dm, ckd4, hiv, sarcoidosis who prsented to [facility] with complaints of back and abdominal pain with nausea and non bloody vomiting. Her family at bedside endorse this has been ongoing for 2 days not. Family provides most history as pt is quite drowsy after pain medication. The state that pt had no other associated symptoms. She had been trying to treat herself for consitpation thinking that was the cause of her pain. No fevers/ chills. No diarrhea. No recent change in medications.   She presented with elevated BP. Systolics >200. She was started on esmolol and nitro gtts and vascular is coming to eval.   Pertinent  Medical History  T2dm Htn Hyperlipidemia Hiv Hypothyroidism ckd4  Significant Hospital Events: Including procedures, antibiotic start and stop dates in addition to other pertinent events   Admitted to hospital 4/14, on cardene and esmolol 4/15 TDC placement, uremic encephalopathy 4/18 CT angio c/a/b, AO bifem. TEVAR  4/19   Interim History / Subjective:  Having back/stomach pain; similar to yesterday. I/O x 1 overnight with some foul smelling urine per nursing, to collect UA.  Objective   Blood pressure (!) 153/65, pulse 76, temperature 99 F (37.2 C), temperature source Oral, resp. rate 18, height  (1.651 m), weight 102.5 kg, last menstrual period 03/31/2014, SpO2 96 %.        Intake/Output Summary (Last 24 hours) at 02/04/2023 4098 Last data filed at 02/04/2023 0700 Gross per 24 hour  Intake 1094.63 ml  Output 2550 ml  Net -1455.37 ml    Filed Weights   02/02/23 0500 02/03/23 0500 02/04/23 0500  Weight: 102 kg 104.9 kg 102.5 kg    Examination: No distress [place] 4 airway Ext warm, pulses x 4 Moves ext to  command Seems oriented for me Groin site looks okay Abd soft, +BS Mild anasarca  Patient Lines/Drains/Airways Status     Active Line/Drains/Airways     Name Placement date Placement time Site Days   Arterial Line 01/29/23 Right Brachial 01/29/23  0930  Brachial  6   Peripheral IV 02/03/23 22 G 1.75" Anterior;Left Forearm 02/03/23  0932  Forearm  1   Hemodialysis Catheter Right Internal jugular Double lumen Permanent (Tunneled) 01/30/23  1606  Internal jugular  5   Incision (Closed) 02/02/23 Groin Right 02/02/23  1644  -- 2   Small Bore Feeding Tube Left nare Marking at nare/corner of mouth 70 cm 02/01/23  1400  Left nare  3   Wound / Incision (Open or Dehisced) 01/30/23 Puncture Neck Right venous access for Tunneled HD 01/30/23  1611  Neck  5           Sodium stable low WBC up slightly Plts stable   Resolved Hospital Problem list     Assessment & Plan:   Type B dissection s/p TEVAR (4/18) -- proximal descending thoracic aorta distal to origin of L subclav to iliacs, extending LLE to bifurcation of superficial and profunda fem arteries  Hx HTN P -VVS following -MAP goal > 80, adjust per VVS -cont fq neurovasc checks. If change in exam would need spinal drain -coreg  BID, amlodipine  qD, hydral  q8, losartan  qD -- hold if needed for MAP goal  - graded pain scale  High suspicion  OSA- needs OP sleep referral  Acute encephalopathy  -variable. Seems like for some providers is answering yes/no in inappropriate pattern, for me is Ox3, then confused  -MRI without watershed  P -delirium precautions  -lactulose, good BM yesterday -if recurs would get ABG  AKI on CKD 5 -- now ESRD  Hyponatremia  P -iHD per nephro  DM2   -SSI   HIV; well controlled recently with undetectable VL and CD4 685 in 12/2022 -cont tivicay, descovy  Hypothyroidism -synthroid   GERD -PPI  Poor PO intake  Nausea P -EN, reduce today to see if appetite picks up -PRN  antiemetic  -meds per tube   Foul smelling urine- I/O cath PRN, f/u UA and urine culture, low threshold for abx   Best Practice (right click and "Reselect all SmartList Selections" daily)   Diet/type: Regular consistency (see orders)- EN DVT prophylaxis: heparin GI prophylaxis: PPI Lines: Dialysis Catheter Foley:  N/A Code Status:  full code Last date of multidisciplinary goals of care discussion [patient updated 4/20]  Keep in ICU Today, if continues in right direction potentially stepdown tomorrow, mental status waxing waning and poor pain control are current issues  Myrla Halsted MD PCCM

## 2023-02-04 NOTE — Progress Notes (Signed)
[facility] [place]  Subjective  -states she has been having some back pain.   Objective 136/62 80 99 F (37.2 C) (Oral) 16 97%  Intake/Output Summary (Last 24 hours) at 02/04/2023 0841 Last data filed at 02/04/2023 0700 Gross per 24 hour  Intake 1094.63 ml  Output 2550 ml  Net -1455.37 ml    Right groin with no hematoma Abdomen morbidly obese but soft with no significant pain Bilateral DP pulses palpable  Laboratory Lab Results: Recent Labs    02/03/23 0419 02/04/23 0309  WBC 14.7* 18.7*  HGB 8.6* 8.3*  HCT 24.4* 23.4*  PLT 213 195   BMET Recent Labs    02/03/23 0419 02/04/23 0309  NA 129* 129*  K 4.1 3.6  CL 93* 94*  CO2 20* 24  GLUCOSE 145* 169*  BUN 53* 39*  CREATININE 8.45* 6.68*  CALCIUM 7.2* 7.4*    COAG Lab Results  Component Value Date   INR 1.0 07/14/2019   INR 1.08 06/20/2018   INR 1.0 09/10/2008   No results found for: "PTT"  Assessment/Planning:  Postop day 2 status post TEVAR for type B dissection with evidence of malperfusion of the lower extremities.  Right groin looks great.  She has palpable dorsalis pedis pulses.  Abdomen is nice and soft.  On oral blood pressure medicine with Coreg, amlodipine, hydralazine, losartan.  Bilateral lower extremities neuro intact.  No signs of malperfusion.  Cephus Shelling 02/04/2023 8:41 AM --

## 2023-02-04 NOTE — Discharge Instructions (Addendum)
[facility]   Discharge Instructions  Thoracic Endovascular Aortic Repair  Please refer to the following instructions for your post-procedure care. Your surgeon or Physician Assistant will discuss any changes with you.  Activity  You are encouraged to walk as much as you can. You can slowly return to normal activities but must avoid strenuous activity and heavy lifting until your doctor tells you it's OK. Avoid activities such as vacuuming or swinging a gold club. It is normal to feel tired for several weeks after your surgery. Do not drive until your doctor gives the OK and you are no longer taking prescription pain medications. It is also normal to have difficulty with sleep habits, eating, and bowel movements after surgery. These will go away with time.  Bathing/Showering  Shower daily after you go home.  Do not soak in a bathtub, hot tub, or swim until the incision heals completely.  If you have incisions in your groin, wash the groin wounds with soap and water daily and pat dry. (No tub bath-only shower)  Then put a dry gauze or washcloth there to keep this area dry to help prevent wound infection daily and as needed.  Do not use Vaseline or neosporin on your incisions.  Only use soap and water on your incisions and then protect and keep dry.  Incision Care  Shower every day. Clean your incision with mild soap and water. Pat the area dry with a clean towel. You do not need a bandage unless otherwise instructed. Do not apply any ointments or creams to your incision. If you clothing is irritating, you may cover your incision with a dry gauze pad.  Diet  Resume your normal diet. There are no special food restrictions following this procedure. A low fat/low cholesterol diet is recommended for all patients with vascular disease. In order to heal from your surgery, it is CRITICAL to get adequate nutrition. Your body requires vitamins, minerals, and  protein. Vegetables are the best source of vitamins and minerals. Vegetables also provide the perfect balance of protein. Processed food has little nutritional value, so try to avoid this.  Medications  Resume taking all of your medications unless your doctor or nurse practitioner tells you not to. If your incision is causing pain, you may take over-the-counter pain relievers such as acetaminophen (Tylenol). If you were prescribed a stronger pain medication, please be aware these medications can cause nausea and constipation. Prevent nausea by taking the medication with a snack or meal. Avoid constipation by drinking plenty of fluids and eating foods with a high amount of fiber, such as fruits, vegetables, and grains.   Do not take Tylenol if you are taking prescription pain medications.   Follow up  Our office will schedule a follow-up appointment with a CT scan 3-4 weeks after your surgery.  Please call us immediately for any of the following conditions  Severe or worsening pain in your legs or feet or in your abdomen back or chest. Increased pain, redness, drainage (pus) from your incision site. Increased abdominal pain, bloating, nausea, vomiting or persistent diarrhea. Fever of 101 degrees or higher. Swelling in your leg (s),  Reduce your risk of vascular disease  Stop smoking. If you would like help call [facility] at 1-800-QUIT-NOW (6576942024) or [place] at [phone]. Manage your cholesterol Maintain a desired weight Control your diabetes Keep your blood pressure down  If you have questions, please call the office at [phone]. =======================================================  Do not drive or operate heavy machinery or holding small children if taking sedating medications such as pain medications or muscle relaxants

## 2023-02-04 NOTE — Progress Notes (Signed)
eLink Physician-Brief Progress Note Patient Name: [name] DOB: [birth year] MRN: [id number]   Date of Service  02/04/2023  HPI/Events of Note  bladder scan > 400cc  eICU Interventions  I&O cath PRN     Intervention Category Minor Interventions: Routine modifications to care plan (e.g. PRN medications for pain, fever)  Davyd Podgorski 02/04/2023, 6:05 AM

## 2023-02-04 NOTE — Progress Notes (Signed)
[facility] Progress Note    Assessment/ Plan:   CKD 5, now ESRD -followed by our office as an outpatient -started HD due to uremic symptoms. S/p [facility] 4/15. IHD #1 4/16. HD #2 4/17. #3 today. Tentatively planning on her next HD to be on Monday but will monitor through the weekend -CLIP underway for outpatient placement -she is interested in PD apparently down the road, will hold off on AVF/AVG for now unless plan changes. Not sure if she would be a candidate for PD at this junction, discussed w/ VVS. Regardless, this is not the time to consider a more permanent access given acuity of her situation and in ICU -Follow daily labs, strict I/O -Avoid nephrotoxic medications including NSAIDs and iodinated intravenous contrast exposure unless the latter is absolutely indicated.  Preferred narcotic agents for pain control are hydromorphone, fentanyl, and methadone. Morphine should not be used. Avoid Baclofen and avoid oral sodium phosphate and magnesium citrate based laxatives / bowel preps. Continue strict Input and Output monitoring. Will monitor the patient closely with you and intervene or adjust therapy as indicated by changes in clinical status/labs   Type B dissection -VVS following. S/p [facility] 4/18 -BP control as below  Encephlopathy -work up per primary service, will see if there is any improvement in mental status as she gets better clearance with HD (undergoing slow start protocol)  HTN -off esmolol, on cleviprex now -per CCM -will UF as tolerated  HIV -on HAART  DM -per primary service  Urinary retention -UA ordered  Hyponatremia -will attempt to manage with HD/UF  Anthony Sar, MD [facility]  Subjective:   Patient seen and examined bedside. No acute events. Seems more cognizant for me today. Has SOB, she reports that everytime she takes a deep breath, has back pain otherwise breathing is okay. Has some urinary retention, discussed w/ RN.    Objective:   BP 136/62 (BP Location: Left Arm)   Pulse 72   Temp 99 F (37.2 C) (Oral)   Resp 15   Ht  (1.651 m)   Wt 102.5 kg   LMP 03/31/2014 (LMP Unknown)   SpO2 95%   BMI 37.60 kg/m   Intake/Output Summary (Last 24 hours) at 02/04/2023 1914 Last data filed at 02/04/2023 0800 Gross per 24 hour  Intake 1234.63 ml  Output 2550 ml  Net -1315.37 ml   Weight change: -2.4 kg  Physical Exam: Gen: drowsy, laying flat in bed CVS: RRR Resp: CTA b/l Abd: soft, nt Ext: trace edema b/l LEs Neuro: drowsy but arousable to vocal stim Dialysis access: [facility]  Imaging: PERIPHERAL VASCULAR CATHETERIZATION  Result Date: 02/02/2023 See surgical note for result.  CT ANGIO AO+BIFEM W & OR WO CONTRAST  Result Date: 02/02/2023 CLINICAL DATA:  57 year old female with history of aortic dissection, first imaging diagnosis 01/29/2023 EXAM: CT ANGIOGRAPHY CHEST, ABDOMEN AND PELVIS TECHNIQUE: Multidetector CT imaging through the chest, abdomen and pelvis was performed using the standard protocol during bolus administration of intravenous contrast. Multiplanar reconstructed images and MIPs were obtained and reviewed to evaluate the vascular anatomy. RADIATION DOSE REDUCTION: This exam was performed according to the departmental dose-optimization program which includes automated exposure control, adjustment of the mA and/or kV according to patient size and/or use of iterative reconstruction technique. CONTRAST:  OMNIPAQUE IOHEXOL 350 MG/ML SOLN COMPARISON:  01/29/2023, 01/31/2023 FINDINGS: CTA CHEST FINDINGS Cardiovascular: Heart: Heart appears enlarged. No pericardial fluid/thickening. No significant coronary calcifications. Aorta: Non-gated CT angiogram. No significant calcifications  of the aortic valve. Redemonstration of acute type B aortic dissection. The dissection flap again originates beyond left subclavian artery origin (zone 3). No evidence of retrograde extension. Estimated diameter  of the ascending aorta 31 mm. Baseline CT during the hyperacute phase 01/29/2023 demonstrates diameter in segment 5 estimated 42 mm by my measurement. Current diameter in segment 5 (image 85 of series 3) estimated 42 mm, unchanged. Estimated diameter at the hiatus on the baseline CT 42 mm. Current diameter at the hiatus (image 134 of series 3) is estimated 42 mm, unchanged. The true lumen is partially fenestrated within the zone 4 and zone 5 of the aorta. True lumen is towards the patient's right at the hiatus. False lumen is partially thrombosed in zone 4 and 5. The false lumen is thrombosed at the aortic arch/zone 3. No periaortic fluid or inflammatory changes. Greatest estimated diameter of the aortic annulus 26 mm on the coronal reformatted images. Greatest estimated diameter of the sino-tubular junction, 24 mm on the coronal images. Pulmonary arteries: Timing of the contrast bolus is not optimized for evaluation of pulmonary artery filling defects. No central filling defect or filling defect within the left or right pulmonary arteries. Segmental and subsegmental pulmonary arteries are poorly characterized. Diameter of the main pulmonary artery unremarkable. Mediastinum/Nodes: No mediastinal adenopathy. Unremarkable appearance of the thoracic esophagus. Unremarkable appearance of the thoracic inlet. Lungs/Pleura: Low lung volumes. Atelectasis at the lung bases again demonstrated. Trace bilateral pleural effusion Central venous catheter again noted from right IJ approach. CTA ABDOMEN AND PELVIS FINDINGS VASCULAR Aorta: Similar appearance of the acute type B dissection, with extension of the flap through the abdominal aorta, terminating at the bifurcation. Diameter at the Visceral aorta is unchanged, estimated 33 mm. Juxtarenal aortic diameter is unchanged, estimated 24 mm. Infrarenal abdominal aorta without aneurysm. The true lumen is towards the patient's right. The flap crosses the celiac artery, which remains  perfused. True lumen perfuses the SMA. True lumen perfuses the right renal artery. The false lumen perfuses the left renal artery. False lumen perfuses the IMA. Celiac: Dissection flap crosses the celiac artery origin. The true lumen perfuses the celiac artery with branch vessels patent. SMA: SMA is patent, perfused from the true lumen. Branch vessels are patent. Renals: - Right: The single right renal artery is perfused from the true lumen. Mild atherosclerotic changes within the renal artery. True lumen diameter is narrowed at the level of the right renal artery origin. - Left: Single left renal artery. The left renal artery is perfused from the false channel. IMA: IMA is perfused from the false lumen.  IMA remains patent. Right lower extremity: Mild atherosclerotic changes of the right common iliac artery, hypogastric artery, external iliac artery. The dissection flap does not enter the right iliac system. No high-grade stenosis or occlusion. Common femoral artery is patent with minimal atherosclerosis. Profunda femoris and the thigh arteries are patent. Superficial femoral artery is patent throughout its length with minimal atherosclerosis. Popliteal artery patent. No significant atherosclerosis. Typical trifurcation. All 3 tibial arteries are patent from the origin to the ankle. Posterior tibial artery is a small caliber artery throughout its length. Left lower extremity: The dissection flap traverses the length of the left iliac system, including common iliac artery and external iliac artery. The dissection flap appears to terminate in the proximal SFA. Left hypogastric artery is occluded at the origin from the dissection flap. Common femoral artery is patent. Profunda femoris and the thigh branches patent. Superficial femoral artery patent throughout  its length with no significant atherosclerotic changes. Popliteal artery patent. Typical trifurcation. All 3 tibial arteries are patent from the origin to the  ankle with no significant tibial arterial disease. Veins: Unremarkable appearance of the venous system. Review of the MIP images confirms the above findings. NON-VASCULAR Hepatobiliary: Relatively unremarkable appearance of liver. High density material in the gallbladder, likely vicarious contrast excretion. Evidence of cholelithiasis noncalcified. Pancreas: Unremarkable. Spleen: Unremarkable. Adrenals/Urinary Tract: - Right adrenal gland: Right adrenal gland thickening. - Left adrenal gland: Left adrenal gland thickening. - Right kidney: Delayed right nephrogram, with some patchy enhancement likely related to poor flow in the setting of dissection flap. Retained contrast within the collecting system. No hydronephrosis. Renal cortical thinning. - Left Kidney: Delayed left nephrogram, with some patchy enhancement likely related to poor flow in the setting of the dissection flap. Retained contrast in the collecting system. Renal cortical thinning, though not as pronounced as the right. - Urinary Bladder: Excreted contrast within the urinary bladder. Stomach/Bowel: - Stomach: Gastric tube in place.  Otherwise unremarkable stomach. - Small bowel: Unremarkable bowel. The early arterial phase limits evaluation for decreased bowel blood flow, however, there is no distension or hypodensity within the bowel loops. - Appendix: Normal. - Colon: Oral contrast within the colon. Left-sided diverticular disease. No inflammatory changes. Lymphatic: No adenopathy. Mesenteric: No free fluid or air. No mesenteric adenopathy. Reproductive: Unremarkable appearance of the pelvic organs. Other: Fat containing umbilical hernia.  Body wall edema/anasarca. Musculoskeletal: No evidence of acute fracture. No bony canal narrowing. No significant degenerative changes of the hips. IMPRESSION: Similar appearance of acute type B dissection, baseline imaging 01/29/2023. Relatively unchanged diameter of the thoracic aorta (maximum estimated 4.2 cm as  above), relatively unchanged perfusion of true lumen and false lumen (as above), and maintained in-line flow to the bilateral ankles without evidence of acute femoropopliteal or tibial occlusion. Known dissection flap extending through the left iliac system, now with collapse of the true lumen and interval occlusion/thrombosis of the left internal iliac artery. No evidence of retrograde extension of the dissection. Bilateral delayed nephrogram of the kidneys, indicating compromised flow from either the presence of dissection flap at the artery lumen (left) and/or dynamic obstruction (on the right). Mesenteric arteries maintained perfusion. Additional ancillary findings as above. Signed, Yvone Neu. Miachel Roux, RPVI [facility] Electronically Signed   By: Gilmer Mor D.O.   On: 02/02/2023 15:02   HYBRID OR IMAGING ([facility] ONLY)  Result Date: 02/02/2023 There is no interpretation for this exam.  This order is for images obtained during a surgical procedure.  Please See "Surgeries" Tab for more information regarding the procedure.   CT ANGIO CHEST AORTA W/CM &/OR WO/CM  Result Date: 02/02/2023 CLINICAL DATA:  Type B aortic dissection. EXAM: CT ANGIOGRAPHY CHEST WITH CONTRAST TECHNIQUE: Multidetector CT imaging of the chest was performed using the standard protocol during bolus administration of intravenous contrast. Multiplanar CT image reconstructions and MIPs were obtained to evaluate the vascular anatomy. RADIATION DOSE REDUCTION: This exam was performed according to the departmental dose-optimization program which includes automated exposure control, adjustment of the mA and/or kV according to patient size and/or use of iterative reconstruction technique. CONTRAST:  OMNIPAQUE IOHEXOL 350 MG/ML SOLN COMPARISON:  Prior CT scan 01/29/2023 and 01/31/2023 FINDINGS: Cardiovascular: Stable mild cardiac enlargement but no pericardial effusion. Stable  type B aortic dissection involving the descending thoracic aorta. No involvement of the arch vessels or ascending aorta. Scattered atherosclerotic calcifications. No definite  coronary artery calcifications. Right IJ catheter is stable. Maximum diameter of the descending thoracic aorta is stable is stable measuring 3.9 x 3.8 cm. Mediastinum/Nodes: Feeding tube is coursing down the esophagus and into the stomach. No esophageal abnormality. Scattered mediastinal and hilar lymph nodes are stable. Lungs/Pleura: Moderate vascular congestion and suspected pulmonary edema with small pleural effusions and bibasilar atelectasis. Upper Abdomen: The aortic branch vessels are patent but there is persistent marked irregularity involving the proximal left renal artery. No significant upper abdominal findings. Musculoskeletal: No significant bony findings. Review of the MIP images confirms the above findings. IMPRESSION: 1. Stable type B aortic dissection involving the descending thoracic aorta. No involvement of the arch vessels or ascending aorta. 2. Stable mild cardiac enlargement. 3. Moderate vascular congestion and suspected pulmonary edema with small pleural effusions and bibasilar atelectasis. 4. Persistent marked irregularity involving the proximal left renal artery but no occlusion. Electronically Signed   By: Rudie Meyer M.D.   On: 02/02/2023 13:00    Labs: BMET Recent Labs  Lab 01/28/23 2358 01/30/23 0355 02/01/23 2542 02/02/23 0423 02/02/23 0854 02/03/23 0419 02/04/23 0309  NA 139 132* 129*  --  128* 129* 129*  K 3.7 4.4 4.4  --  4.0 4.1 3.6  CL 102 98 94*  --  91* 93* 94*  CO2 24 20* 17*  --  21* 20* 24  GLUCOSE 156* 120* 150*  --  168* 145* 169*  BUN 54* 60* 58*  --  45* 53* 39*  CREATININE 5.99* 7.44* 8.70*  --  7.71* 8.45* 6.68*  CALCIUM 9.3 7.8* 7.4*  --  7.5* 7.2* 7.4*  PHOS  --   --  9.0* 7.6*  --  9.9* 6.0*   CBC Recent Labs  Lab 01/30/23 0355 02/02/23 1849 02/03/23 0419  02/04/23 0309  WBC 12.6* 13.3* 14.7* 18.7*  HGB 7.2* 8.8* 8.6* 8.3*  HCT 22.1* 25.4* 24.4* 23.4*  MCV 101.8* 94.8 93.8 94.4  PLT 175 219 213 195    Medications:     amLODipine  10 mg Per Tube Daily   aspirin  81 mg Per Tube Daily   carvedilol  25 mg Per Tube BID   Chlorhexidine Gluconate Cloth  6 each Topical Daily   Chlorhexidine Gluconate Cloth  6 each Topical Q0600   dolutegravir  50 mg Per Tube Daily   emtricitabine-tenofovir AF  1 tablet Per Tube Daily   feeding supplement (PROSource TF20)  60 mL Per Tube Daily   heparin  5,000 Units Subcutaneous Q8H   hydrALAZINE  100 mg Per Tube Q8H   insulin aspart  0-15 Units Subcutaneous Q4H   lactulose  10 g Per Tube TID   levothyroxine  175 mcg Per Tube Q0600   lidocaine  1 patch Transdermal Q24H   losartan  100 mg Per Tube Daily   multivitamin  1 tablet Per Tube QHS   pantoprazole  40 mg Oral Daily   rosuvastatin  10 mg Per Tube Daily   sertraline  50 mg Per Tube Daily      Anthony Sar, MD [facility] 02/04/2023, 9:11 AM

## 2023-02-05 ENCOUNTER — Inpatient Hospital Stay (HOSPITAL_COMMUNITY): Payer: Medicare Other

## 2023-02-05 DIAGNOSIS — I7102 Dissection of abdominal aorta: Principal | ICD-10-CM | POA: Insufficient documentation

## 2023-02-05 HISTORY — DX: Dissection of abdominal aorta: I71.02

## 2023-02-05 LAB — GLUCOSE, CAPILLARY
Glucose-Capillary: 132 mg/dL — ABNORMAL HIGH (ref 70–99)
Glucose-Capillary: 140 mg/dL — ABNORMAL HIGH (ref 70–99)
Glucose-Capillary: 115 mg/dL — ABNORMAL HIGH (ref 70–99)
Glucose-Capillary: 137 mg/dL — ABNORMAL HIGH (ref 70–99)
Glucose-Capillary: 152 mg/dL — ABNORMAL HIGH (ref 70–99)
Glucose-Capillary: 133 mg/dL — ABNORMAL HIGH (ref 70–99)
Glucose-Capillary: 124 mg/dL — ABNORMAL HIGH (ref 70–99)

## 2023-02-05 LAB — RENAL FUNCTION PANEL
Albumin: 2.1 g/dL — ABNORMAL LOW (ref 3.5–5.0)
Anion gap: 13 (ref 5–15)
BUN: 55 mg/dL — ABNORMAL HIGH (ref 6–20)
CO2: 23 mmol/L (ref 22–32)
Calcium: 8 mg/dL — ABNORMAL LOW (ref 8.9–10.3)
Chloride: 95 mmol/L — ABNORMAL LOW (ref 98–111)
Creatinine, Ser: 7.83 mg/dL — ABNORMAL HIGH (ref 0.44–1.00)
GFR, Estimated: 6 mL/min — ABNORMAL LOW (ref >60)
Glucose, Bld: 159 mg/dL — ABNORMAL HIGH (ref 70–99)
Phosphorus: 7 mg/dL — ABNORMAL HIGH (ref 2.5–4.6)
Potassium: 3.6 mmol/L (ref 3.5–5.1)
Sodium: 131 mmol/L — ABNORMAL LOW (ref 135–145)

## 2023-02-05 LAB — CBC
HCT: 24 % — ABNORMAL LOW (ref 36.0–46.0)
Hemoglobin: 7.9 g/dL — ABNORMAL LOW (ref 12.0–15.0)
MCH: 32 pg (ref 26.0–34.0)
MCHC: 32.9 g/dL (ref 30.0–36.0)
MCV: 97.2 fL (ref 80.0–100.0)
Platelets: 196 10*3/uL (ref 150–400)
RBC: 2.47 MIL/uL — ABNORMAL LOW (ref 3.87–5.11)
RDW: 16.3 % — ABNORMAL HIGH (ref 11.5–15.5)
WBC: 19.8 10*3/uL — ABNORMAL HIGH (ref 4.0–10.5)
nRBC: 0 % (ref 0.0–0.2)

## 2023-02-05 LAB — PHOSPHORUS: Phosphorus: 7 mg/dL — ABNORMAL HIGH (ref 2.5–4.6)

## 2023-02-05 LAB — MAGNESIUM: Magnesium: 2.2 mg/dL (ref 1.7–2.4)

## 2023-02-05 LAB — LACTIC ACID, PLASMA: Lactic Acid, Venous: 0.4 mmol/L — ABNORMAL LOW (ref 0.5–1.9)

## 2023-02-05 MED ORDER — BISACODYL 10 MG RE SUPP
10.0000 mg | Freq: Every day | RECTAL | Status: DC
  Administered 2023-02-05 – 2023-02-12 (×4): 10 mg via RECTAL
  Filled 2023-02-05 (×11): qty 1

## 2023-02-05 MED ORDER — METOCLOPRAMIDE HCL 5 MG/ML IJ SOLN
5.0000 mg | Freq: Two times a day (BID) | INTRAMUSCULAR | Status: DC
  Administered 2023-02-05 – 2023-02-12 (×16): 5 mg via INTRAVENOUS
  Filled 2023-02-05 (×16): qty 2

## 2023-02-05 MED ORDER — PANTOPRAZOLE SODIUM 40 MG IV SOLR
40.0000 mg | INTRAVENOUS | Status: DC
  Administered 2023-02-05: 40 mg via INTRAVENOUS
  Filled 2023-02-05: qty 10

## 2023-02-05 NOTE — Evaluation (Signed)
Occupational Therapy Evaluation Patient Details Name: [name] MRN: [id number] DOB: 09-25-1966 Today's Date: 02/05/2023   History of Present Illness 57 y.o. 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 S/p 4/18 thoracic endovascular aortic repair. PMH:t2dm, ckd4, hiv, sarcoidosis   Clinical Impression   Pt reports independence at baseline with ADLs and functional mobility, lives with daughter. Pt limited by abdominal pain, needing min-mod A for ADLs, min A for bed mobility, and min A +2 for transfers with RW. VSS on supplemental O2. Pt presenting with impairments listed below, will follow acutely. Recommend HHOT at d/c pending progression.     Recommendations for follow up therapy are one component of a multi-disciplinary discharge planning process, led by the attending physician.  Recommendations may be updated based on patient status, additional functional criteria and insurance authorization.   Assistance Recommended at Discharge Intermittent Supervision/Assistance  Patient can return home with the following A little help with walking and/or transfers;A lot of help with bathing/dressing/bathroom;Assistance with cooking/housework;Direct supervision/assist for medications management;Direct supervision/assist for financial management;Assist for transportation;Help with stairs or ramp for entrance    Functional Status Assessment  Patient has had a recent decline in their functional status and demonstrates the ability to make significant improvements in function in a reasonable and predictable amount of time.  Equipment Recommendations  Tub/shower bench;Other (comment) (RW)    Recommendations for Other Services PT consult     Precautions / Restrictions Precautions Precautions: Fall Precaution Comments: [place]rak, Restrictions Weight  Bearing Restrictions: No      Mobility Bed Mobility Overal bed mobility: Needs Assistance Bed Mobility: Supine to Sit   Sidelying to sit: Min assist            Transfers Overall transfer level: Needs assistance Equipment used: Rolling walker (2 wheels) Transfers: Sit to/from Stand, Bed to chair/wheelchair/BSC Sit to Stand: Min assist, From elevated surface, +2 safety/equipment     Step pivot transfers: Min assist, +2 safety/equipment     General transfer comment: min physical assist and maximal cuing for hand placement for power up to standing, minA for RW managment and maximal cuing for sequencing for stepping to chair      Balance Overall balance assessment: Needs assistance Sitting-balance support: Feet supported, Bilateral upper extremity supported Sitting balance-Leahy Scale: Fair     Standing balance support: Bilateral upper extremity supported, During functional activity, Reliant on assistive device for balance Standing balance-Leahy Scale: Poor                             ADL either performed or assessed with clinical judgement   ADL Overall ADL's : Needs assistance/impaired Eating/Feeding: Sitting;Minimal assistance   Grooming: Minimal assistance;Sitting   Upper Body Bathing: Moderate assistance   Lower Body Bathing: Moderate assistance   Upper Body Dressing : Minimal assistance   Lower Body Dressing: Moderate assistance   Toilet Transfer: Minimal assistance;Rolling walker (2 wheels);BSC/3in1;Stand-pivot   Toileting- Clothing Manipulation and Hygiene: Moderate assistance       Functional mobility during ADLs: Minimal assistance;+2 for safety/equipment       Vision   Vision Assessment?: No apparent visual deficits     Perception Perception Perception Tested?: No   Praxis Praxis Praxis tested?: Not tested    Pertinent Vitals/Pain Pain Assessment Pain Assessment: Faces Pain  Score: 5  Faces Pain Scale: Hurts even more Pain  Location: abdomen Pain Descriptors / Indicators: Discomfort Pain Intervention(s): Limited activity within patient's tolerance, Monitored during session, Repositioned     Hand Dominance     Extremity/Trunk Assessment Upper Extremity Assessment Upper Extremity Assessment: RUE deficits/detail RUE Deficits / Details: 2+/5 shoulder ROM, difficulty controlling descent from lifing arm to lowering to bed RUE Coordination: decreased fine motor;decreased gross motor   Lower Extremity Assessment Lower Extremity Assessment: Defer to PT evaluation   Cervical / Trunk Assessment Cervical / Trunk Assessment: Normal   Communication Communication Communication: No difficulties   Cognition Arousal/Alertness: Lethargic, Suspect due to medications Behavior During Therapy: Flat affect Overall Cognitive Status: Within Functional Limits for tasks assessed                                 General Comments: follows commands appropriately and provides PLOF accurately, daughter present in room and confirms     General Comments  VSS on supplemental O2    Exercises     Shoulder Instructions      Home Living Family/patient expects to be discharged to:: Private residence Living Arrangements: Children (daughter) Available Help at Discharge: Family;Available PRN/intermittently Type of Home: House Home Access: Stairs to enter [employer] of Steps: 2 Entrance Stairs-Rails: None Home Layout: Two level;Bed/bath upstairs;1/2 bath on main level Alternate Level Stairs-Number of Steps: 10 Alternate Level Stairs-Rails: Right;Left;Can reach both Bathroom Shower/Tub: Chief Strategy Officer: Standard Bathroom Accessibility: Yes   Home Equipment: None          Prior Functioning/Environment Prior Level of Function : Independent/Modified Independent;Driving             Mobility Comments: community ambulator ADLs Comments: ind with ADLs        OT Problem List:  Decreased strength;Decreased range of motion;Decreased activity tolerance;Impaired balance (sitting and/or standing);Decreased cognition;Decreased coordination;Cardiopulmonary status limiting activity;Impaired UE functional use      OT Treatment/Interventions: Self-care/ADL training;Therapeutic exercise;DME and/or AE instruction;Energy conservation;Therapeutic activities;Patient/family education;Balance training;Cognitive remediation/compensation;Visual/perceptual remediation/compensation    OT Goals(Current goals can be found in the care plan section) Acute Rehab OT Goals Patient Stated Goal: none stated OT Goal Formulation: With patient Time For Goal Achievement: 02/19/23 Potential to Achieve Goals: Good ADL Goals Pt Will Perform Upper Body Dressing: with supervision;sitting Pt Will Perform Lower Body Dressing: with supervision;sit to/from stand;sitting/lateral leans Pt Will Transfer to Toilet: with supervision;ambulating;regular height toilet Pt Will Perform Tub/Shower Transfer: Tub transfer;Shower transfer;with supervision;ambulating;rolling walker  OT Frequency: Min 1X/week    Co-evaluation              AM-PAC OT "6 Clicks" Daily Activity     Outcome Measure Help from another person eating meals?: A Little Help from another person taking care of personal grooming?: A Little Help from another person toileting, which includes using toliet, bedpan, or urinal?: A Little Help from another person bathing (including washing, rinsing, drying)?: A Lot Help from another person to put on and taking off regular upper body clothing?: A Little Help from another person to put on and taking off regular lower body clothing?: A Lot 6 Click Score: 16   End of Session Equipment Utilized During Treatment: Gait belt;Rolling walker (2 wheels);Oxygen (2L) Nurse Communication: Mobility status  Activity Tolerance: Patient tolerated treatment well Patient left: in chair;with call bell/phone within  reach;with chair alarm set;with family/visitor present  OT Visit Diagnosis: Unsteadiness on  feet (R26.81);Other abnormalities of gait and mobility (R26.89);Muscle weakness (generalized) (M62.81)                Time: 4098-1191 OT Time Calculation (min): 18 min Charges:  OT General Charges $OT Visit: 1 Visit OT Evaluation $OT Eval Moderate Complexity: 1 Mod  Takeia Ciaravino K, OTD, OTR/L [employer] Preferred Acute Rehab [phone]   Carver Fila Koonce 02/05/2023, 4:03 PM

## 2023-02-05 NOTE — Progress Notes (Signed)
   NAME:  [name], MRN:  [id number], DOB:  Jul 15, 1966, LOS: 7 ADMISSION DATE:  01/29/2023, CONSULTATION DATE:  01/29/23 REFERRING MD:  EDP, CHIEF COMPLAINT:  aortic dissection   History of Present Illness:  57 yo female with pmh t2dm, ckd4, hiv, sarcoidosis who prsented to [facility] with complaints of back and abdominal pain with nausea and non bloody vomiting. Her family at bedside endorse this has been ongoing for 2 days not. Family provides most history as pt is quite drowsy after pain medication. The state that pt had no other associated symptoms. She had been trying to treat herself for consitpation thinking that was the cause of her pain. No fevers/ chills. No diarrhea. No recent change in medications.   She presented with elevated BP. Systolics >200. She was started on esmolol and nitro gtts and vascular is coming to eval.   Pertinent  Medical History  T2dm Htn Hyperlipidemia Hiv Hypothyroidism ckd4  Significant Hospital Events: Including procedures, antibiotic start and stop dates in addition to other pertinent events   Admitted to hospital 4/14, on cardene and esmolol 4/15 TDC placement, uremic encephalopathy 4/18 CT angio c/a/b, AO bifem. TEVAR  4/19   Interim History / Subjective:  More abd distension, nausea this am Small BM yesterday Back pain better  Objective   Blood pressure (!) 143/62, pulse 72, temperature 98.5 F (36.9 C), temperature source Oral, resp. rate 17, height  (1.651 m), weight 102.7 kg, last menstrual period 03/31/2014, SpO2 99 %.        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 distal to origin of L subclav to iliacs, extending LLE to bifurcation of superficial and profunda fem arteries  Hx HTN P -coreg  BID, amlodipine  qD, hydral  q8, losartan  qD -- hold if needed for MAP goal  - graded pain scale - Goal MAPS 70s-80s given location of stent - neurovascular checks spaced to q6h  High suspicion OSA- needs OP sleep referral  AKI on CKD 5 -- now ESRD  Hyponatremia  P -iHD per nephro  DM2   -SSI   HIV; well controlled recently with undetectable VL and CD4 685 in 12/2022 -cont tivicay, descovy  Hypothyroidism- last TSH 2.6 08/23/22 -synthroid   GERD -PPI  Poor PO intake  Nausea Increased Abd distension - Most c/w ileus - KUB, lactate  Foul smelling urine-was supposed to get a UA, will f/u with nurse, getting I/O PRN as does still make some urine  Best Practice (right click and "Reselect all SmartList Selections" daily)   Diet/type: Meds only for now pending ileus workup DVT prophylaxis: heparin GI prophylaxis: PPI Lines: Dialysis Catheter Foley:  N/A Code Status:  full code Last date of multidisciplinary goals of care discussion [patient updated 4/21]  Keeping in ICU for ileus workup and may need anti-HTN drip if cannot take PO  Myrla Halsted MD PCCM

## 2023-02-05 NOTE — Progress Notes (Signed)
[facility] Progress Note    Assessment/ Plan:   CKD 5, now ESRD -followed by our office as an outpatient -started HD due to uremic symptoms. S/p [facility] 4/15. IHD #1 4/16. HD #2 4/17. #3 4/19. Next HD tomorrow. I think her SOB may be related to some abd distension as her lungs are clear on exam -CLIP underway for outpatient placement -she is interested in PD apparently down the road (discussed as an outpatient), will hold off on AVF/AVG for now unless plan changes. Not sure if she would be a candidate for PD at this junction, discussed w/ VVS previously. Regardless, this is not the time to consider a more permanent access given acuity of her situation and in ICU -Follow daily labs, strict I/O -Avoid nephrotoxic medications including NSAIDs and iodinated intravenous contrast exposure unless the latter is absolutely indicated.  Preferred narcotic agents for pain control are hydromorphone, fentanyl, and methadone. Morphine should not be used. Avoid Baclofen and avoid oral sodium phosphate and magnesium citrate based laxatives / bowel preps. Continue strict Input and Output monitoring. Will monitor the patient closely with you and intervene or adjust therapy as indicated by changes in clinical status/labs   Type B dissection -VVS following. S/p TEVAR 4/18 -BP control as below  Encephlopathy -work up per primary service, will see if there is any improvement in mental status as she gets better clearance with HD (undergoing slow start protocol). Her mentation seems to have improved today  HTN -off esmolol & clev -per CCM -will UF as tolerated  HIV -on HAART  DM2 -per primary service  Urinary retention -UA ordered-still pending  Hyponatremia -will attempt to manage with HD/UF  N/V, abd distension -per primary. Concern for ileus. KUB/lactate pending  Anthony Sar, MD [facility]  Subjective:   Patient seen and examined bedside. No acute events. She  reports N/V and some SOB. Not much urine to collect yesterday as bladder scan did not have enough for I/O cath. Discussed w/ RN   Objective:   BP 136/62   Pulse 72   Temp 98.7 F (37.1 C) (Oral)   Resp 14   Ht  (1.651 m)   Wt 102.7 kg   LMP 03/31/2014 (LMP Unknown)   SpO2 99%   BMI 37.68 kg/m   Intake/Output Summary (Last 24 hours) at 02/05/2023 0851 Last data filed at 02/05/2023 0700 Gross per 24 hour  Intake 776.34 ml  Output 60 ml  Net 716.34 ml   Weight change: 0.2 kg  Physical Exam: Gen: drowsy, laying flat in bed CVS: RRR Resp: CTA b/l Abd: soft, nt, slightly distended Ext: trace edema b/l LEs Neuro: awake/alert Dialysis access: R[facility]  Imaging: No results found.  Labs: BMET Recent Labs  Lab 01/30/23 0355 02/01/23 0454 02/02/23 0423 02/02/23 0854 02/03/23 0419 02/04/23 0309 02/05/23 0128 02/05/23 0129  NA 132* 129*  --  128* 129* 129*  --  131*  K 4.4 4.4  --  4.0 4.1 3.6  --  3.6  CL 98 94*  --  91* 93* 94*  --  95*  CO2 20* 17*  --  21* 20* 24  --  23  GLUCOSE 120* 150*  --  168* 145* 169*  --  159*  BUN 60* 58*  --  45* 53* 39*  --  55*  CREATININE 7.44* 8.70*  --  7.71* 8.45* 6.68*  --  7.83*  CALCIUM 7.8* 7.4*  --  7.5* 7.2* 7.4*  --  8.0*  PHOS  --  9.0* 7.6*  --  9.9* 6.0* 7.0* 7.0*   CBC Recent Labs  Lab 02/02/23 1849 02/03/23 0419 02/04/23 0309 02/05/23 0128  WBC 13.3* 14.7* 18.7* 19.8*  HGB 8.8* 8.6* 8.3* 7.9*  HCT 25.4* 24.4* 23.4* 24.0*  MCV 94.8 93.8 94.4 97.2  PLT 219 213 195 196    Medications:     amLODipine  10 mg Per Tube Daily   aspirin  81 mg Per Tube Daily   bisacodyl  10 mg Rectal Q0600   carvedilol  25 mg Per Tube BID   Chlorhexidine Gluconate Cloth  6 each Topical Daily   Chlorhexidine Gluconate Cloth  6 each Topical Q0600   dolutegravir  50 mg Per Tube Daily   emtricitabine-tenofovir AF  1 tablet Per Tube Daily   feeding supplement (PROSource TF20)  60 mL Per Tube Daily   heparin  5,000 Units  Subcutaneous Q8H   hydrALAZINE  100 mg Per Tube Q8H   insulin aspart  0-15 Units Subcutaneous Q4H   levothyroxine  175 mcg Per Tube Q0600   lidocaine  1 patch Transdermal Q24H   losartan  100 mg Per Tube Daily   metoCLOPramide (REGLAN) injection  5 mg Intravenous Q12H   multivitamin  1 tablet Per Tube QHS   mouth rinse  15 mL Mouth Rinse 4 times per day   pantoprazole  40 mg Oral Daily   rosuvastatin  10 mg Per Tube Daily   sertraline  50 mg Per Tube Daily      Anthony Sar, MD [facility] 02/05/2023, 8:51 AM

## 2023-02-05 NOTE — Progress Notes (Signed)
[facility]  Subjective  - having abdominal distension, concern for ileus from ICU team   Objective 136/62 72 98.7 F (37.1 C) (Oral) 14 99%  Intake/Output Summary (Last 24 hours) at 02/05/2023 0951 Last data filed at 02/05/2023 0700 Gross per 24 hour  Intake 776.34 ml  Output 60 ml  Net 716.34 ml    Right groin with no hematoma Abdomen morbidly obese but notable distension today Bilateral DP pulses palpable  Laboratory Lab Results: Recent Labs    02/04/23 0309 02/05/23 0128  WBC 18.7* 19.8*  HGB 8.3* 7.9*  HCT 23.4* 24.0*  PLT 195 196   BMET Recent Labs    02/04/23 0309 02/05/23 0129  NA 129* 131*  K 3.6 3.6  CL 94* 95*  CO2 24 23  GLUCOSE 169* 159*  BUN 39* 55*  CREATININE 6.68* 7.83*  CALCIUM 7.4* 8.0*    COAG Lab Results  Component Value Date   INR 1.0 07/14/2019   INR 1.08 06/20/2018   INR 1.0 09/10/2008   No results found for: "PTT"  Assessment/Planning:  Postop day 3 status post TEVAR for type B dissection with evidence of malperfusion of the lower extremities.  Right groin looks great.  She continues to have palpable dorsalis pedis pulses. On oral blood pressure medicine with Coreg, amlodipine, hydralazine, losartan.  Bilateral lower extremities neuro intact.  No signs of malperfusion.  Discussed with ICU can start to liberalize MAP goals greater than 80.  Keeping in ICU another day with concern for ileus picture.  Cephus Shelling 02/05/2023 9:51 AM --

## 2023-02-06 ENCOUNTER — Inpatient Hospital Stay (HOSPITAL_COMMUNITY): Payer: Medicare Other

## 2023-02-06 DIAGNOSIS — I7102 Dissection of abdominal aorta: Secondary | ICD-10-CM | POA: Diagnosis not present

## 2023-02-06 LAB — TYPE AND SCREEN
ABO/RH(D): O POS
Antibody Screen: NEGATIVE
Unit division: 0

## 2023-02-06 LAB — BPAM RBC: Unit Type and Rh: 5100

## 2023-02-06 LAB — RENAL FUNCTION PANEL
Albumin: 2.1 g/dL — ABNORMAL LOW (ref 3.5–5.0)
Anion gap: 16 — ABNORMAL HIGH (ref 5–15)
BUN: 71 mg/dL — ABNORMAL HIGH (ref 6–20)
CO2: 19 mmol/L — ABNORMAL LOW (ref 22–32)
Calcium: 8.2 mg/dL — ABNORMAL LOW (ref 8.9–10.3)
Chloride: 93 mmol/L — ABNORMAL LOW (ref 98–111)
Creatinine, Ser: 9.08 mg/dL — ABNORMAL HIGH (ref 0.44–1.00)
GFR, Estimated: 5 mL/min — ABNORMAL LOW (ref >60)
Glucose, Bld: 113 mg/dL — ABNORMAL HIGH (ref 70–99)
Phosphorus: 8.8 mg/dL — ABNORMAL HIGH (ref 2.5–4.6)
Potassium: 4.3 mmol/L (ref 3.5–5.1)
Sodium: 128 mmol/L — ABNORMAL LOW (ref 135–145)

## 2023-02-06 LAB — CBC
HCT: 24.7 % — ABNORMAL LOW (ref 36.0–46.0)
Hemoglobin: 8.3 g/dL — ABNORMAL LOW (ref 12.0–15.0)
MCH: 32.4 pg (ref 26.0–34.0)
MCHC: 33.6 g/dL (ref 30.0–36.0)
MCV: 96.5 fL (ref 80.0–100.0)
Platelets: 238 10*3/uL (ref 150–400)
RBC: 2.56 MIL/uL — ABNORMAL LOW (ref 3.87–5.11)
RDW: 16.3 % — ABNORMAL HIGH (ref 11.5–15.5)
WBC: 16.7 10*3/uL — ABNORMAL HIGH (ref 4.0–10.5)
nRBC: 0.1 % (ref 0.0–0.2)

## 2023-02-06 LAB — GLUCOSE, CAPILLARY
Glucose-Capillary: 106 mg/dL — ABNORMAL HIGH (ref 70–99)
Glucose-Capillary: 116 mg/dL — ABNORMAL HIGH (ref 70–99)
Glucose-Capillary: 121 mg/dL — ABNORMAL HIGH (ref 70–99)
Glucose-Capillary: 169 mg/dL — ABNORMAL HIGH (ref 70–99)
Glucose-Capillary: 173 mg/dL — ABNORMAL HIGH (ref 70–99)
Glucose-Capillary: 180 mg/dL — ABNORMAL HIGH (ref 70–99)

## 2023-02-06 MED ORDER — NOREPINEPHRINE 4 MG/250ML-% IV SOLN
2.0000 ug/min | INTRAVENOUS | Status: DC
  Administered 2023-02-06: 2 ug/min via INTRAVENOUS
  Administered 2023-02-06: 7 ug/min via INTRAVENOUS
  Administered 2023-02-08: 2 ug/min via INTRAVENOUS
  Administered 2023-02-09: 4 ug/min via INTRAVENOUS
  Administered 2023-02-09: 10 ug/min via INTRAVENOUS
  Administered 2023-02-09: 2 ug/min via INTRAVENOUS
  Filled 2023-02-06 (×5): qty 250

## 2023-02-06 MED ORDER — HYDRALAZINE HCL 20 MG/ML IJ SOLN
10.0000 mg | INTRAMUSCULAR | Status: DC | PRN
  Administered 2023-02-14 – 2023-02-20 (×3): 10 mg via INTRAVENOUS
  Filled 2023-02-06 (×4): qty 1

## 2023-02-06 MED ORDER — HEPARIN SODIUM (PORCINE) 1000 UNIT/ML IJ SOLN
INTRAMUSCULAR | Status: AC
  Administered 2023-02-06: 1000 [IU]
  Filled 2023-02-06: qty 4

## 2023-02-06 MED ORDER — SODIUM CHLORIDE 0.9 % IV SOLN
250.0000 mL | INTRAVENOUS | Status: DC
  Administered 2023-02-10: 250 mL via INTRAVENOUS

## 2023-02-06 MED ORDER — FAMOTIDINE 20 MG PO TABS
20.0000 mg | ORAL_TABLET | Freq: Every day | ORAL | Status: DC
  Administered 2023-02-06 – 2023-02-08 (×3): 20 mg
  Filled 2023-02-06 (×3): qty 1

## 2023-02-06 NOTE — Progress Notes (Addendum)
Nutrition Follow-up  DOCUMENTATION CODES:   Non-severe (moderate) malnutrition in context of acute illness/injury  INTERVENTION:   Tube Feeding via Cortrak (post pyloric: jejunal) Vital 1.5 at 20 ml/hr (trickle TF today) Goal TF Vital 1.5 at 50 ml/hr Goal TF provides 81 g of protein, 1800 kcals and 912 mL of free water  Add Renal MVI daily  NUTRITION DIAGNOSIS:   Moderate Malnutrition related to acute illness as evidenced by energy intake < or equal to 50% for > or equal to 5 days, mild muscle depletion, moderate muscle depletion.  Being addressed via TF  GOAL:   Patient will meet greater than or equal to 90% of their needs  Progressing  MONITOR:   Diet advancement, TF tolerance, Labs, Weight trends  REASON FOR ASSESSMENT:   Rounds    ASSESSMENT:   57 yo female admitted with Type B aortic dissection, AKI on CKD 4-progressed to ESRD and iHD initiated. PMH DM, CKD 4, HIV, sarcoidosis, HTN, HLD  4/15 TDC placed 4/16 1st iHD 4/17 2nd iHD, Cortrak placed, trickle TF 4/18 OR: thoracic, mesenteric, infrarenal abdominal angiograms, thoracic endovascular aortic repair 4/19 3rd iHD, TF titrated to 40 ml/hr 4/21 Abd xray with non specific distention of SB and colon, NPO  Currently NPO. Pt with inadequate nutrition since admission Noted reglan started yesterday  Family at bedside, pt receiving iHD at bedside. Mentation clearer. Requiring levo on iHD this AM  Recorded po intake on 4/20 was 10-50% of meal trays, nothing recorded on 4/19. NPO since the afternoon on 4/20  TF currently on hold   +stool, +flatus. Bowel regimen currently dulcolax suppository daily with colace and miralax prn. Reglan started yesterday. Noted lactulose has been discontinued  NG placed today for decompression. Corrtrak tube able to be advanced to post pyloric position. Discussed with Dr. Katrinka Blazing and ok to trial trickle TF  Labs: sodium 128 (L), phosphorus 8.8 (H), potassium 4.3 (wdl) Meds: Renal  MVI, ss novolog  NUTRITION - FOCUSED PHYSICAL EXAM:  Flowsheet Row Most Recent Value  Orbital Region Mild depletion  Upper Arm Region No depletion  Thoracic and Lumbar Region No depletion  Temple Region Mild depletion  Clavicle Bone Region Mild depletion  Clavicle and Acromion Bone Region Mild depletion  Scapular Bone Region Mild depletion  Dorsal Hand Mild depletion  Patellar Region Moderate depletion  Anterior Thigh Region Moderate depletion  Posterior Calf Region Moderate depletion  Edema (RD Assessment) Moderate       Diet Order:   Diet Order             Diet NPO time specified Except for: Sips with Meds  Diet effective now                   EDUCATION NEEDS:   Education needs have been addressed  Skin:  Skin Assessment: Reviewed RN Assessment  Last BM:  4/21 type 6 mushy, +flatus  Height:   Ht Readings from Last 1 Encounters:  01/28/23  (1.651 m)    Weight:   Wt Readings from Last 1 Encounters:  02/06/23 101.8 kg    BMI:  Body mass index is 37.35 kg/m.  Estimated Nutritional Needs:   Kcal:  1750-1950 kcals  Protein:  75-85 g  Fluid:  1L plus UOP   Romelle Starcher MS, RDN, LDN, CNSC Registered Dietitian 3 Clinical Nutrition RD Pager and On-Call Pager Number Located in [place]

## 2023-02-06 NOTE — Plan of Care (Signed)

## 2023-02-06 NOTE — Progress Notes (Signed)
PT Cancellation Note  Patient Details Name: Donnie Gedeon MRN: [id number] DOB: [birth year]   Cancelled Treatment:    Reason Eval/Treat Not Completed: Patient declined, no reason specified (attempted initially at 8 but pt refused stating pain, meds received and upon return pt continued to refuse stating she will not get up today despite education)   Enedina Finner Menachem Urbanek 02/06/2023, 9:01 AM Merryl Hacker, PT Acute Rehabilitation Services Office: [phone]

## 2023-02-06 NOTE — Progress Notes (Signed)
Received patient in bed in ICU Alert and oriented.  Informed consent signed and in chart.   TX duration:3.5 H. Pt tolerated tx well and goal met.  Patient tolerated well.  without acute distress.  Hand-off given to patient's nurse.   Access used: catheter Access issues: none  Total UF removed: 3.5 L Medication(s) given: none Post HD VS: 97/57 P 70 R 18 Post HD weight: 98.5 kg   [name]

## 2023-02-06 NOTE — Progress Notes (Signed)
NAME:  Averey Koning, MRN:  [id number], DOB:  Dec 08, 1965, LOS: 8 ADMISSION DATE:  01/29/2023, CONSULTATION DATE:  01/29/23 REFERRING MD:  EDP, CHIEF COMPLAINT:  aortic dissection   History of Present Illness:  57 yo female with pmh t2dm, ckd4, hiv, sarcoidosis who prsented to [facility] with complaints of back and abdominal pain with nausea and non bloody vomiting. Her family at bedside endorse this has been ongoing for 2 days not. Family provides most history as pt is quite drowsy after pain medication. The state that pt had no other associated symptoms. She had been trying to treat herself for consitpation thinking that was the cause of her pain. No fevers/ chills. No diarrhea. No recent change in medications.   She presented with elevated BP. Systolics >200. She was started on esmolol and nitro gtts and vascular is coming to eval.   Pertinent  Medical History  T2dm Htn Hyperlipidemia Hiv Hypothyroidism ckd4  Significant Hospital Events: Including procedures, antibiotic start and stop dates in addition to other pertinent events   Admitted to hospital 4/14, on cardene and esmolol 4/15 TDC placement, uremic encephalopathy 4/18 CT angio c/a/b, AO bifem. TEVAR  4/19   Interim History / Subjective:  Still no flatus Abd remains distended BP dropping during HD Some tingling in legs started overnight  Objective   Blood pressure (!) 107/57, pulse 66, temperature 98.7 F (37.1 C), temperature source Oral, resp. rate (!) 25, height  (1.651 m), weight 101.8 kg, last menstrual period 03/31/2014, SpO2 98 %.        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) -- proximal descending thoracic aorta distal to origin of L subclav to iliacs, extending LLE to bifurcation of superficial and profunda fem arteries  Hx HTN P -coreg  BID, amlodipine  qD, hydral  q8, losartan  qD -- hold if needed for MAP goal 70s - 80s - graded pain scale - neurovascular checks spaced to q6h  High suspicion OSA- needs OP sleep referral  AKI on CKD 5 -- now ESRD  Hyponatremia  P -iHD per nephro  Currently, low Bps+ LE parasthesia- will need some augmentation - Hold parameters placed for BP meds - Levophed titrated to MAP 70-80  DM2   -SSI   HIV; well controlled recently with undetectable VL and CD4 685 in 12/2022 -cont tivicay, descovy; on hold due to ileus unfortunately  Hypothyroidism- last TSH 2.6 08/23/22 -synthroid   GERD -PPI  Ileus- ongoing issue; may be driving relative hypotension via vagal - NGT today to LIS - Continue reglan - Limit opiates as much as possible - Encourage mobility   Best Practice (right click and "Reselect all SmartList Selections" daily)   Diet/type: NPO  DVT prophylaxis: heparin GI prophylaxis: PPI Lines: Dialysis Catheter Foley:  N/A Code Status:  full code Last date of multidisciplinary goals of care discussion [patient updated 4/21]  31 min cc time (pressor titration, neurovascular checks)  Myrla Halsted MD PCCM

## 2023-02-06 NOTE — Progress Notes (Signed)
An USGPIV (ultrasound guided PIV) has been placed for short-term vasopressor infusion. A correctly placed ivWatch must be used when administering Vasopressors. Should this treatment be needed beyond 72 hours, central line access should be obtained.  It will be the responsibility of the bedside nurse to follow best practice to prevent extravasations.   

## 2023-02-06 NOTE — Progress Notes (Signed)
[name] Progress Note    Assessment/ Plan:   # CKD stage 5 with progression to ESRD -followed by our office as an outpatient.  Started HD due to uremic symptoms. S/p [facility] 4/15. IHD #1 4/16. HD #2 4/17. #3 4/19.  ----- - HD today then per MWF schedule for now  - CLIP underway for outpatient placement - will check in with HD SW - She is interested in PD apparently down the road (discussed as an outpatient), will hold off on AVF/AVG for now unless plan changes.  Not sure if she would be a candidate for PD at this junction, discussed w/ [facility] previously.  This is not the time to consider a more permanent access given acuity  -Follow daily labs, strict I/O  # Type B dissection -[facility]VS following. S/p TEVAR 4/18 -BP control as below  # Encephalopathy -work up per primary service.  Per charting mentation has improved which may correlate with HD  # HTN -off esmolol & clev -per CCM - optimize volume as tolerated with HD  # HIV - therapy per primary team   # DM2 -per primary service  # Urinary retention   -UA ordered-still pending  # Hyponatremia - optimize with HD/UF  # N/V, abd distension -per primary team. Concern for ileus  # Normocytic Anemia  - transfusions per primary team     Subjective:   Last HD on 4/19 with 1.9 kg UF.  She had no UOP over 4/21 charted.  Per charting, her bladder scan on 4/21 had minimal urine (note 650 mL and one unmeasured void on 4/19 charted though - last charted UOP).   Note she was kept in the ICU given concerns for ileus and her MAP goals were increased per vascular.   Just was bladder scanned for 163 mL per RN.  They are getting her up to the commode and then doing a bath.    Review of systems:  She reports some shortness of breath  She had a BM yesterday per RN.   Abdominal discomfort but feels better today    Objective:   BP 131/61   Pulse 76   Temp 98.5 F (36.9 C) (Oral)   Resp 18   Ht  (1.651 m)   Wt 102.7  kg   LMP 03/31/2014 (LMP Unknown)   SpO2 93%   BMI 37.68 kg/m   Intake/Output Summary (Last 24 hours) at 02/06/2023 0605 Last data filed at 02/05/2023 2000 Gross per 24 hour  Intake 90 ml  Output 65 ml  Net 25 ml   Weight change:   Physical Exam:   General adult female sitting on side of bed with assistance HEENT normocephalic atraumatic extraocular movements intact sclera anicteric Neck supple trachea midline Lungs clear to auscultation bilaterally increased work of breathing with changing positions and is resting Heart regular rate and rhythm no rubs or gallops appreciated Abdomen soft some tenderness; distended/obese habitus Extremities no edema lower extremities  Psych no anxiety or agitation Neuro patient provides some history and follows commands Access: RIJ tunn catheter in place    Imaging: DG Abd 1 View  Result Date: 02/05/2023 CLINICAL DATA:  [id number] Ileus [id number] EXAM: ABDOMEN - 1 VIEW COMPARISON:  CT 02/02/2023 FINDINGS: Descending thoracic aortic stent graft has been placed, proximal aspect not visualized. Feeding tube extends to the pylorus. A few gas filled small bowel loops in the left abdomen. Moderate gaseous distention of the colon. Residual contrast material in the urinary bladder. IMPRESSION:  1. Nonspecific gaseous distention of small bowel and colon. 2. Descending thoracic aortic stent graft. Electronically Signed   By: Corlis Leak M.D.   On: 02/05/2023 08:54    Labs: BMET Recent Labs  Lab 02/01/23 1610 02/02/23 0423 02/02/23 0854 02/03/23 0419 02/04/23 0309 02/05/23 0128 02/05/23 0129 02/06/23 0042  NA 129*  --  128* 129* 129*  --  131* 128*  K 4.4  --  4.0 4.1 3.6  --  3.6 4.3  CL 94*  --  91* 93* 94*  --  95* 93*  CO2 17*  --  21* 20* 24  --  23 19*  GLUCOSE 150*  --  168* 145* 169*  --  159* 113*  BUN 58*  --  45* 53* 39*  --  55* 71*  CREATININE 8.70*  --  7.71* 8.45* 6.68*  --  7.83* 9.08*  CALCIUM 7.4*  --  7.5* 7.2* 7.4*  --  8.0* 8.2*  PHOS  9.0* 7.6*  --  9.9* 6.0* 7.0* 7.0* 8.8*   CBC Recent Labs  Lab 02/03/23 0419 02/04/23 0309 02/05/23 0128 02/06/23 0042  WBC 14.7* 18.7* 19.8* 16.7*  HGB 8.6* 8.3* 7.9* 8.3*  HCT 24.4* 23.4* 24.0* 24.7*  MCV 93.8 94.4 97.2 96.5  PLT 213 195 196 238    Medications:     amLODipine  10 mg Per Tube Daily   aspirin  81 mg Per Tube Daily   bisacodyl  10 mg Rectal Q0600   carvedilol  25 mg Per Tube BID   Chlorhexidine Gluconate Cloth  6 each Topical Daily   Chlorhexidine Gluconate Cloth  6 each Topical Q0600   dolutegravir  50 mg Per Tube Daily   emtricitabine-tenofovir AF  1 tablet Per Tube Daily   feeding supplement (PROSource TF20)  60 mL Per Tube Daily   heparin  5,000 Units Subcutaneous Q8H   hydrALAZINE  100 mg Per Tube Q8H   insulin aspart  0-15 Units Subcutaneous Q4H   levothyroxine  175 mcg Per Tube Q0600   lidocaine  1 patch Transdermal Q24H   losartan  100 mg Per Tube Daily   metoCLOPramide (REGLAN) injection  5 mg Intravenous Q12H   multivitamin  1 tablet Per Tube QHS   mouth rinse  15 mL Mouth Rinse 4 times per day   pantoprazole (PROTONIX) IV  40 mg Intravenous Q24H   rosuvastatin  10 mg Per Tube Daily   sertraline  50 mg Per Tube Daily     Estanislado Emms, MD 6:24 AM 02/06/2023

## 2023-02-06 NOTE — Progress Notes (Addendum)
  Progress Note    02/06/2023 7:54 AM 4 Days Post-Op  Subjective:  no complaints.  Has not been OOB much per nursing staff; abd feeling better but still some nausea   Vitals:   02/06/23 0700 02/06/23 0741  BP: (!) 111/53   Pulse: 71   Resp: 14   Temp:  98.3 F (36.8 C)  SpO2: 96%    Physical Exam: Lungs:  non labored on O2 by [place] Incisions:  R groin well healed Extremities:  palpable and symmetrical DP pulses Abdomen:  soft, NT, ND Neurologic: A&O  CBC    Component Value Date/Time   WBC 16.7 (H) 02/06/2023 0042   RBC 2.56 (L) 02/06/2023 0042   HGB 8.3 (L) 02/06/2023 0042   HGB 11.7 (L) 07/27/2021 1110   HCT 24.7 (L) 02/06/2023 0042   PLT 238 02/06/2023 0042   PLT 228 07/27/2021 1110   MCV 96.5 02/06/2023 0042   MCH 32.4 02/06/2023 0042   MCHC 33.6 02/06/2023 0042   RDW 16.3 (H) 02/06/2023 0042   LYMPHSABS 1.6 12/19/2022 1242   MONOABS 0.7 12/19/2022 1242   EOSABS 0.2 12/19/2022 1242   BASOSABS 0.0 12/19/2022 1242    BMET    Component Value Date/Time   NA 128 (L) 02/06/2023 0042   NA 142 12/05/2022 0000   K 4.3 02/06/2023 0042   CL 93 (L) 02/06/2023 0042   CO2 19 (L) 02/06/2023 0042   GLUCOSE 113 (H) 02/06/2023 0042   BUN 71 (H) 02/06/2023 0042   BUN 44 (A) 12/05/2022 0000   CREATININE 9.08 (H) 02/06/2023 0042   CREATININE 2.72 (H) 07/27/2021 1110   CREATININE 2.74 (H) 03/17/2021 1050   CALCIUM 8.2 (L) 02/06/2023 0042   GFRNONAA 5 (L) 02/06/2023 0042   GFRNONAA 20 (L) 07/27/2021 1110   GFRAA 39 (L) 07/14/2020 1053   GFRAA 33 (L) 08/12/2019 1447    INR    Component Value Date/Time   INR 1.0 07/14/2019 0312     Intake/Output Summary (Last 24 hours) at 02/06/2023 0754 Last data filed at 02/05/2023 2000 Gross per 24 hour  Intake 30 ml  Output 65 ml  Net -35 ml     Assessment/Plan:  57 y.o. female is s/p TEVAR 4 Days Post-Op   BLE well perfused with symmetrical DP pulses; motor and sensation stable R groin incision well healed No signs or  symptoms of malperfusion on exam Needs to get OOB and participate with therapy Ok to liberalize MAP goals per Dr. Chestine Spore GI: concern for ileus, KUB with distended small bowel and colon; abd feels better today but still nauseous; continue NPO for now; encouraged patient to ambulate, d/c dilaudid  Emilie Rutter, PA-C [facility] [phone] 02/06/2023 7:54 AM  VASCULAR STAFF ADDENDUM: I have independently interviewed and examined the patient. I agree with the above.  No concern for paralysis at this time.  Groin soft Ileus, Continue NPO Map goal less than  Fara Olden, MD [facility] Phone Number: [phone] 02/06/2023 10:57 AM

## 2023-02-07 ENCOUNTER — Inpatient Hospital Stay (HOSPITAL_COMMUNITY): Payer: Medicare Other

## 2023-02-07 ENCOUNTER — Encounter: Payer: Commercial Managed Care - HMO | Admitting: Gastroenterology

## 2023-02-07 ENCOUNTER — Encounter (HOSPITAL_COMMUNITY): Payer: Commercial Managed Care - HMO

## 2023-02-07 DIAGNOSIS — I7102 Dissection of abdominal aorta: Secondary | ICD-10-CM | POA: Diagnosis not present

## 2023-02-07 LAB — IRON AND TIBC
Iron: 31 ug/dL (ref 28–170)
Saturation Ratios: 15 % (ref 10.4–31.8)
TIBC: 204 ug/dL — ABNORMAL LOW (ref 250–450)
UIBC: 173 ug/dL

## 2023-02-07 LAB — RENAL FUNCTION PANEL
Albumin: 2.1 g/dL — ABNORMAL LOW (ref 3.5–5.0)
Anion gap: 12 (ref 5–15)
BUN: 50 mg/dL — ABNORMAL HIGH (ref 6–20)
CO2: 25 mmol/L (ref 22–32)
Calcium: 8.4 mg/dL — ABNORMAL LOW (ref 8.9–10.3)
Chloride: 93 mmol/L — ABNORMAL LOW (ref 98–111)
Creatinine, Ser: 6.71 mg/dL — ABNORMAL HIGH (ref 0.44–1.00)
GFR, Estimated: 7 mL/min — ABNORMAL LOW (ref >60)
Glucose, Bld: 174 mg/dL — ABNORMAL HIGH (ref 70–99)
Phosphorus: 6.3 mg/dL — ABNORMAL HIGH (ref 2.5–4.6)
Potassium: 3.8 mmol/L (ref 3.5–5.1)
Sodium: 130 mmol/L — ABNORMAL LOW (ref 135–145)

## 2023-02-07 LAB — CBC
HCT: 23.8 % — ABNORMAL LOW (ref 36.0–46.0)
Hemoglobin: 8.3 g/dL — ABNORMAL LOW (ref 12.0–15.0)
MCH: 32.9 pg (ref 26.0–34.0)
MCHC: 34.9 g/dL (ref 30.0–36.0)
MCV: 94.4 fL (ref 80.0–100.0)
Platelets: 223 10*3/uL (ref 150–400)
RBC: 2.52 MIL/uL — ABNORMAL LOW (ref 3.87–5.11)
RDW: 16.1 % — ABNORMAL HIGH (ref 11.5–15.5)
WBC: 11.6 10*3/uL — ABNORMAL HIGH (ref 4.0–10.5)
nRBC: 0 % (ref 0.0–0.2)

## 2023-02-07 LAB — GLUCOSE, CAPILLARY
Glucose-Capillary: 122 mg/dL — ABNORMAL HIGH (ref 70–99)
Glucose-Capillary: 126 mg/dL — ABNORMAL HIGH (ref 70–99)
Glucose-Capillary: 134 mg/dL — ABNORMAL HIGH (ref 70–99)
Glucose-Capillary: 176 mg/dL — ABNORMAL HIGH (ref 70–99)
Glucose-Capillary: 186 mg/dL — ABNORMAL HIGH (ref 70–99)
Glucose-Capillary: 82 mg/dL (ref 70–99)

## 2023-02-07 LAB — LACTIC ACID, PLASMA
Lactic Acid, Venous: 0.7 mmol/L (ref 0.5–1.9)
Lactic Acid, Venous: 0.7 mmol/L (ref 0.5–1.9)

## 2023-02-07 LAB — FERRITIN: Ferritin: 622 ng/mL — ABNORMAL HIGH (ref 11–307)

## 2023-02-07 MED ORDER — CHLORHEXIDINE GLUCONATE CLOTH 2 % EX PADS
6.0000 | MEDICATED_PAD | Freq: Every day | CUTANEOUS | Status: DC
  Administered 2023-02-08 – 2023-02-12 (×4): 6 via TOPICAL

## 2023-02-07 MED ORDER — IOHEXOL 350 MG/ML SOLN
100.0000 mL | Freq: Once | INTRAVENOUS | Status: AC | PRN
  Administered 2023-02-07: 100 mL via INTRAVENOUS

## 2023-02-07 MED ORDER — SIMETHICONE 80 MG PO CHEW
80.0000 mg | CHEWABLE_TABLET | Freq: Four times a day (QID) | ORAL | Status: DC | PRN
  Administered 2023-02-07 – 2023-02-14 (×5): 80 mg via ORAL
  Filled 2023-02-07 (×5): qty 1

## 2023-02-07 MED ORDER — FENTANYL CITRATE PF 50 MCG/ML IJ SOSY
50.0000 ug | PREFILLED_SYRINGE | INTRAMUSCULAR | Status: DC | PRN
  Administered 2023-02-07 – 2023-02-09 (×4): 50 ug via INTRAVENOUS
  Filled 2023-02-07 (×4): qty 1

## 2023-02-07 MED ORDER — FENTANYL CITRATE PF 50 MCG/ML IJ SOSY
PREFILLED_SYRINGE | INTRAMUSCULAR | Status: AC
  Filled 2023-02-07: qty 1

## 2023-02-07 MED ORDER — ACETAMINOPHEN 10 MG/ML IV SOLN
1000.0000 mg | Freq: Four times a day (QID) | INTRAVENOUS | Status: AC
  Administered 2023-02-07 – 2023-02-08 (×4): 1000 mg via INTRAVENOUS
  Filled 2023-02-07 (×4): qty 100

## 2023-02-07 NOTE — Consult Note (Signed)
[name] 1966-08-14  [id number].    Requesting MD: Katrinka Blazing, MD Chief Complaint/Reason for Consult: SBO vs ileus   HPI:  [name] is a 57 y/o F with MMP listed below who presented 01/29/23 with cc abdominal and back pain associated with nausea and vomiting. Workup was significant for HTN emergency and Type B Aortic dissection. She was admitted for medical management of dissection but developed poor perfusion to her right lower extremity and underwent TEVAR on 02/02/23 by Dr. Karin Lieu. The patient has had abdominal distention and discomfort since surgery. Initially she states that the pain was mild but it became more severe last night and associated with worsening bloating. She has been having some bowel function since her procedure; she had 3 loose stools on 4/19, 1 small stool 4/20, and 2 loose stools yesterday. States that she passed some flatus yesterday but none today. Tube feedings were stopped last night due to her worsening abdominal symptoms and NG tube placed. A CTA was ordered today showing interval placement of an aortic stent graft without worrisome complicating features, as well as an SBO vs ileus with dilated loops of small bowel. WBC trending down 11.6, lactic acid WNL. Surgery is asked to consult.   Prior abdominal surgeries: panniculectomy   Family History  Problem Relation Age of Onset   Hypertension Mother    Cancer Mother        laryngeal   Heart disease Mother        stent   Pancreatic cancer Father    Hypertension Father    Lung cancer Father 44       hx smoking   Hypertension Sister    Hypertension Sister    Hypertension Brother    Breast cancer Maternal Aunt 9   Breast cancer Maternal Aunt        dx 60+   Cancer Maternal Uncle        Lung CA   Breast cancer Paternal Aunt 21   Breast cancer Paternal Aunt        dx 46's   Breast cancer Paternal Aunt        dx 17's   Prostate cancer Paternal Uncle    Prostate cancer Paternal Uncle    Lung  cancer Paternal Uncle    Breast cancer Cousin 48   Breast cancer Cousin        dx <50   Breast cancer Cousin        dx <50   Breast cancer Cousin        dx <50   Heart disease Other    Hypertension Other    Stroke Other        Grandparent   Kidney disease Other        Grandparent   Diabetes Other        FH of Diabetes   Colon cancer Neg Hx    Esophageal cancer Neg Hx    Rectal cancer Neg Hx    Stomach cancer Neg Hx     Past Medical History:  Diagnosis Date   Acute pancreatitis 10/23/2021   Acute renal failure superimposed on stage 4 chronic kidney disease 10/24/2021   Allergy    Anemia    Normocytic   Antibiotic-induced yeast infection 09/28/2020   Anxiety    Asthma    Blood dyscrasia    Bronchitis 2005   Bursitis of left shoulder 09/06/2019   CKD (chronic kidney disease)    CLASS 1-EXOPHTHALMOS-THYROTOXIC 02/08/2007  COVID-19 virus infection 04/28/2022   Diabetes mellitus without complication    Family history of breast cancer    Family history of lung cancer    Family history of prostate cancer    Gastroenteritis 07/10/2007   Genetic testing 07/25/2018   [employer] + RNA Insight was ordered.  Genes Analyzed (43 total): APC*, ATM*, AXIN2, BARD1, BMPR1A, BRCA1*, BRCA2*, BRIP1*, CDH1*, CDK4, CDKN2A, CHEK2*, DICER1, GALNT12, HOXB13, MEN1, MLH1*, MRE11A, MSH2*, MSH3, MSH6*, MUTYH*, NBN, NF1*, NTHL1, PALB2*, PMS2*, POLD1, POLE, PTEN*, RAD50, RAD51C*, RAD51D*, RET, SDHB, SDHD, SMAD4, SMARCA4, STK11 and TP53* (sequencing and deletion/duplication); EGFR (s   GERD 07/24/2006   GRAVE'S DISEASE 01/01/2008   History of hidradenitis suppurativa    History of kidney stones    History of thrush    HIV DISEASE 07/24/2006   dx March 05   Hyperlipidemia    HYPERTENSION 07/24/2006   Hyperthyroidism 08/2006   Grave's Disease -diffuse radiotracer uptake 08/25/06 Thyroid scan-Cold nodule to R lower lobe of thyrorid   Menometrorrhagia    hx of   Nephrolithiasis    Panniculitis  05/12/2020   Papillary adenocarcinoma of thyroid    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 Completed 2 yrs Prednisone after lung bx confirmation. No symptoms since then.   Sebaceous cyst 04/21/2020   Suppurative hidradenitis    Thyroid cancer    THYROID NODULE, RIGHT 02/08/2007    Past Surgical History:  Procedure Laterality Date   APPLICATION OF WOUND VAC N/A 01/20/2021   Procedure: APPLICATION OF WOUND VAC;  Surgeon: Peggye Form, DO;  Location: [facility];  Service: Plastics;  Laterality: N/A;   BREAST EXCISIONAL BIOPSY Right 04/26/2018   right axilla negative   BREAST EXCISIONAL BIOPSY Left 04/26/2018   left axilla negative   BREAST LUMPECTOMY Right 10/03/2018   malignant   BREAST LUMPECTOMY WITH RADIOACTIVE SEED AND SENTINEL LYMPH NODE BIOPSY Right 10/03/2018   Procedure: RIGHT BREAST LUMPECTOMY WITH RADIOACTIVE SEED AND SENTINEL LYMPH NODE MAPPING;  Surgeon: Harriette Bouillon, MD;  Location: [facility];  Service: General;  Laterality: Right;   BREAST SURGERY  1997   Breast Reduction    CYSTOSCOPY W/ URETERAL STENT REMOVAL  11/09/2012   Procedure: CYSTOSCOPY WITH STENT REMOVAL;  Surgeon: Sebastian Ache, MD;  Location: [facility];  Service: Urology;  Laterality: Right;   CYSTOSCOPY WITH RETROGRADE PYELOGRAM, URETEROSCOPY AND STENT PLACEMENT  11/09/2012   Procedure: CYSTOSCOPY WITH RETROGRADE PYELOGRAM, URETEROSCOPY AND STENT PLACEMENT;  Surgeon: Sebastian Ache, MD;  Location: [facility];  Service: Urology;  Laterality: Left;  LEFT URETEROSCOPY, STONE  MANIPULATION, left STENT exchange    CYSTOSCOPY WITH STENT PLACEMENT  10/02/2012   Procedure: CYSTOSCOPY WITH STENT PLACEMENT;  Surgeon: Sebastian Ache, MD;  Location: [facility];  Service: Urology;  Laterality: Left;   DEBRIDEMENT AND CLOSURE WOUND N/A 01/20/2021   Procedure: Excision of abdominal wound with closure;  Surgeon: Peggye Form, DO;  Location: [facility];  Service: Plastics;  Laterality: N/A;   DILATION AND CURETTAGE OF UTERUS  11/2002   s/p for 1st trimester nonviable pregnancy  EYE SURGERY     sty under eyelid   INCISE AND DRAIN ABCESS  08/2002   s/p I &D for righ inframmary fold hidradenitis   INCISION AND DRAINAGE PERITONSILLAR ABSCESS  12/2001   IR CV LINE INJECTION  06/07/2018   IR FLUORO GUIDE CV LINE RIGHT  01/30/2023   IR IMAGING GUIDED PORT INSERTION  06/20/2018   IR REMOVAL TUN ACCESS W/ PORT W/O FL MOD SED  06/20/2018   IR US GUIDE VASC ACCESS RIGHT  01/30/2023   IRRIGATION AND DEBRIDEMENT ABSCESS  01/31/2012   Procedure: IRRIGATION AND DEBRIDEMENT ABSCESS;  Surgeon: Kandis Cocking, MD;  [facility]: [facility];  Service: General;  Laterality: Right;  right breast and axilla    NEPHROLITHOTOMY  10/02/2012   Procedure: NEPHROLITHOTOMY PERCUTANEOUS;  Surgeon: Sebastian Ache, MD;  Location: [facility];  Service: Urology;  Laterality: Right;  First Stage Percutaneous Nephrolithotomy with Surgeon Access, Left Ureteral Stent     NEPHROLITHOTOMY  10/04/2012   Procedure: NEPHROLITHOTOMY PERCUTANEOUS SECOND LOOK;  Surgeon: Sebastian Ache, MD;  Location: [facility];  Service: Urology;  Laterality: Right;      NEPHROLITHOTOMY  10/08/2012   Procedure: NEPHROLITHOTOMY PERCUTANEOUS;  Surgeon: Sebastian Ache, MD;  Location: [facility];  Service: Urology;  Laterality: Right;  THIRD STAGE, nephrostomy tube exchange x 2   NEPHROLITHOTOMY  10/11/2012   Procedure: NEPHROLITHOTOMY PERCUTANEOUS SECOND LOOK;  Surgeon: Sebastian Ache, MD;  Location: [facility];  Service: Urology;   Laterality: Right;  RIGHT 4 STAGE PERCUTANOUS NEPHROLITHOTOMY, right URETEROSCOPY WITH HOLMIUM LASER    PANNICULECTOMY N/A 12/21/2020   Procedure: PANNICULECTOMY;  Surgeon: Peggye Form, DO;  [facility];  Service: Plastics;  Laterality: N/A;   PERCUTANEOUS NEPHROSTOLITHOTOMY  04/2022   PORT-A-CATH REMOVAL N/A 07/16/2020   Procedure: REMOVAL PORT-A-CATH;  Surgeon: Harriette Bouillon, MD;  Location: [facility];  Service: General;  Laterality: N/A;   PORTACATH PLACEMENT Left 05/17/2018   Procedure: INSERTION PORT-A-CATH;  Surgeon: Abigail Miyamoto, MD;  Location: [facility];  Service: General;  Laterality: Left;   RADICAL NECK DISSECTION  04/12/2017   limited/notes 04/12/2017   RADICAL NECK DISSECTION N/A 04/12/2017   Procedure: RADICAL NECK DISSECTION;  Surgeon: Christia Reading, MD;  [facility]: [facility];  Service: ENT;  Laterality: N/A;  limited neck dissection 2 hours total   REDUCTION MAMMAPLASTY Bilateral 1998   RIGHT/LEFT HEART CATH AND CORONARY ANGIOGRAPHY N/A 03/12/2020   Procedure: RIGHT/LEFT HEART CATH AND CORONARY ANGIOGRAPHY;  Surgeon: [place] Patty, MD;  [facility];  Service: Cardiovascular;  Laterality: N/A;   [place]  1994   THORACIC AORTIC ENDOVASCULAR STENT GRAFT N/A 02/02/2023   Procedure: THORACIC AORTIC ENDOVASCULAR STENT GRAFT;  Surgeon: Victorino Sparrow, MD;  Location: [facility] OR;  Service: Vascular;  Laterality: N/A;   THYROIDECTOMY  04/12/2017   completion/notes 04/12/2017   THYROIDECTOMY N/A 04/12/2017   Procedure: THYROIDECTOMY;  Surgeon: Christia Reading, MD;  Location: [facility] OR;  Service: ENT;  Laterality: N/A;  Completion Thyroidectomy   TOTAL THYROIDECTOMY  2010    Social History:  reports that she quit smoking about 5 years ago. Her smoking use included cigarettes. She started smoking about 5 years ago. She has a 7.50 pack-year smoking history. She has never used smokeless tobacco. She reports current alcohol use. She  reports that she does not use drugs.  Allergies:  Allergies  Allergen Reactions   Genvoya [Elviteg-Cobic-Emtricit-Tenofaf] Hives   Lisinopril Cough   Tape Rash    Rash  Okay with tegaderm and paper tape   Aldactone [Spironolactone] Hives   Tegaderm Ag Mesh [Silver] Itching    Medications Prior to Admission  Medication Sig Dispense Refill   acetaminophen (TYLENOL) 500 MG tablet Take 1,000 mg by mouth every 6 (six) hours as needed for mild pain.     albuterol (VENTOLIN HFA) 108 (90 Base) MCG/ACT inhaler Inhale 2 puffs into the lungs every 6 (six) hours as needed for wheezing or shortness of breath. 8 g 2   allopurinol (ZYLOPRIM) 100 MG tablet TAKE 1 TABLET BY MOUTH EVERY DAY FOR GOUT PREVENTION (Patient taking differently: Take 100 mg by mouth daily. For gout prevention) 90 tablet 0   candesartan (ATACAND) 32 MG tablet TAKE 1 TABLET BY MOUTH DAILY. 30 tablet 5   carvedilol (COREG) 25 MG tablet Take 1 tablet (25 mg total) by mouth 2 (two) times daily. 180 tablet 3   cetirizine (ZYRTEC) 10 MG tablet TAKE 1 TABLET BY MOUTH AT BEDTIME FOR ALLERGIES (Patient taking differently: Take 10 mg by mouth at bedtime.) 90 tablet 0   dolutegravir ([name]) 50 MG tablet TAKE 1 TABLET BY MOUTH EVERY DAY 30 tablet 0   emtricitabine-tenofovir AF (DESCOVY) 200-25 MG tablet TAKE 1 TABLET BY MOUTH EVERY DAY 30 tablet 0   FLOVENT HFA 110 MCG/ACT inhaler TAKE 1 PUFF BY MOUTH TWICE A DAY (Patient taking differently: Inhale 1 puff into the lungs 2 (two) times daily as needed (wheezing/shortness of breath).) 36 Inhaler 1   fluticasone (FLONASE) 50 MCG/ACT nasal spray Place 2 sprays into both nostrils daily. 16 g 6   gabapentin (NEURONTIN) 300 MG capsule Take 300 mg by mouth daily.     glipiZIDE (GLUCOTROL) 10 MG tablet TAKE 1 TABLET BY MOUTH TWICE A DAY FOR DIABETES (Patient taking differently: Take 10 mg by mouth 2 (two) times daily before a meal.) 180 tablet 0   hydrALAZINE (APRESOLINE) 50 MG tablet TAKE 1 TABLET  (50 MG) BY MOUTH IN THE MORNING AND AT BEDTIME (Patient taking differently: Take 50 mg by mouth in the morning and at bedtime.) 60 tablet 3   levothyroxine (SYNTHROID) 175 MCG tablet TAKE 1 TABLET BY MOUTH EVERY DAY 30 tablet 7   minocycline (DYNACIN) 100 MG tablet Take 1 tablet (100 mg total) by mouth 2 (two) times daily. 60 tablet 11   omeprazole (PRILOSEC) 40 MG capsule Take 1 capsule (40 mg total) by mouth 2 (two) times daily before a meal. 180 capsule 0   rosuvastatin (CRESTOR) 10 MG tablet TAKE 1 TABLET BY MOUTH EVERY DAY FOR CHOLESTEROL (Patient taking differently: Take 10 mg by mouth daily. FOR CHOLESTEROL) 90 tablet 2   sertraline (ZOLOFT) 50 MG tablet TAKE 1 TABLET (50 MG TOTAL) BY MOUTH DAILY. FOR ANXIETY AND DEPRESSION. 90 tablet 3   tamoxifen (NOLVADEX) 20 MG tablet Take 1 tablet (20 mg total) by mouth daily. 90 tablet 4   Blood Glucose Monitoring Suppl (FREESTYLE LITE) w/Device KIT Inject 1 each into the skin daily. 1 kit 0   glucose blood (ONETOUCH VERIO) test strip USE AS INSTRUCTED TO TEST BLOOD SUGAR up to 3 times daily. 300 each 3   Lancets (ONETOUCH ULTRASOFT) lancets Use as instructed to test blood sugar daily 100 each 5     Physical Exam: Blood pressure (!) 98/51, pulse 67, temperature 98.3 F (36.8 C), temperature source Oral, resp. rate 16, height 5\' 5"  (1.651 m), weight 99.1 kg, last menstrual period 03/31/2014, SpO2 94 %. General: pleasant, WD/WN female who is  laying in bed in NAD HEENT: head is normocephalic, atraumatic.  Sclera are noninjected.  Pupils equal and round.  Ears and nose without any masses or lesions.  Mouth is pink and moist. Dentition fair Heart: regular, rate, and rhythm Lungs: CTAB, no wheezes, rhonchi, or rales noted.  Respiratory effort nonlabored on  Abd: distended but soft, hypoactive BS, mild diffuse tenderness without rebound or guarding. no masses, hernias, or organomegaly Skin: warm and dry with no masses, lesions, or rashes  Results for  orders placed or performed during the hospital encounter of 01/29/23 (from the past 48 hour(s))  Glucose, capillary     Status: Abnormal   Collection Time: 02/05/23  4:59 PM  Result Value Ref Range   Glucose-Capillary 152 (H) 70 - 99 mg/dL    Comment: Glucose reference range applies only to samples taken after fasting for at least 8 hours.  Glucose, capillary     Status: Abnormal   Collection Time: 02/05/23  8:15 PM  Result Value Ref Range   Glucose-Capillary 133 (H) 70 - 99 mg/dL    Comment: Glucose reference range applies only to samples taken after fasting for at least 8 hours.  Glucose, capillary     Status: Abnormal   Collection Time: 02/05/23 11:25 PM  Result Value Ref Range   Glucose-Capillary 124 (H) 70 - 99 mg/dL    Comment: Glucose reference range applies only to samples taken after fasting for at least 8 hours.  CBC     Status: Abnormal   Collection Time: 02/06/23 12:42 AM  Result Value Ref Range   WBC 16.7 (H) 4.0 - 10.5 K/uL   RBC 2.56 (L) 3.87 - 5.11 MIL/uL   Hemoglobin 8.3 (L) 12.0 - 15.0 g/dL   HCT 16.1 (L) 09.6 - 04.5 %   MCV 96.5 80.0 - 100.0 fL   MCH 32.4 26.0 - 34.0 pg   MCHC 33.6 30.0 - 36.0 g/dL   RDW 40.9 (H) 81.1 - 91.4 %   Platelets 238 150 - 400 K/uL   nRBC 0.1 0.0 - 0.2 %    Comment: Performed at [facility], [address], [place], [place] [address]  Renal function panel     Status: Abnormal   Collection Time: 02/06/23 12:42 AM  Result Value Ref Range   Sodium 128 (L) 135 - 145 mmol/L   Potassium 4.3 3.5 - 5.1 mmol/L   Chloride 93 (L) 98 - 111 mmol/L   CO2 19 (L) 22 - 32 mmol/L   Glucose, Bld 113 (H) 70 - 99 mg/dL    Comment: Glucose reference range applies only to samples taken after fasting for at least 8 hours.   BUN 71 (H) 6 - 20 mg/dL   Creatinine, Ser 6.21 (H) 0.44 - 1.00 mg/dL   Calcium 8.2 (L) 8.9 - 10.3 mg/dL   Phosphorus 8.8 (H) 2.5 - 4.6 mg/dL   Albumin 2.1 (L) 3.5 - 5.0 g/dL   GFR, Estimated 5 (L) >60 mL/min    Comment:  (NOTE) Calculated using the CKD-EPI Creatinine Equation (2021)    Anion gap 16 (H) 5 - 15    Comment: Performed at [facility], [address] [place], [place] [address]  Glucose, capillary     Status: Abnormal   Collection Time: 02/06/23  4:46 AM  Result Value Ref Range   Glucose-Capillary 116 (H) 70 - 99 mg/dL    Comment: Glucose reference range applies only to samples taken after fasting for at  least 8 hours.  Glucose, capillary     Status: Abnormal   Collection Time: 02/06/23  7:38 AM  Result Value Ref Range   Glucose-Capillary 121 (H) 70 - 99 mg/dL    Comment: Glucose reference range applies only to samples taken after fasting for at least 8 hours.  Glucose, capillary     Status: Abnormal   Collection Time: 02/06/23 12:36 PM  Result Value Ref Range   Glucose-Capillary 106 (H) 70 - 99 mg/dL    Comment: Glucose reference range applies only to samples taken after fasting for at least 8 hours.  Glucose, capillary     Status: Abnormal   Collection Time: 02/06/23  4:23 PM  Result Value Ref Range   Glucose-Capillary 173 (H) 70 - 99 mg/dL    Comment: Glucose reference range applies only to samples taken after fasting for at least 8 hours.  Glucose, capillary     Status: Abnormal   Collection Time: 02/06/23  8:41 PM  Result Value Ref Range   Glucose-Capillary 180 (H) 70 - 99 mg/dL    Comment: Glucose reference range applies only to samples taken after fasting for at least 8 hours.  Glucose, capillary     Status: Abnormal   Collection Time: 02/06/23 11:32 PM  Result Value Ref Range   Glucose-Capillary 169 (H) 70 - 99 mg/dL    Comment: Glucose reference range applies only to samples taken after fasting for at least 8 hours.  CBC     Status: Abnormal   Collection Time: 02/07/23 12:34 AM  Result Value Ref Range   WBC 11.6 (H) 4.0 - 10.5 K/uL   RBC 2.52 (L) 3.87 - 5.11 MIL/uL   Hemoglobin 8.3 (L) 12.0 - 15.0 g/dL   HCT 16.1 (L) 09.6 - 04.5 %   MCV 94.4 80.0 - 100.0 fL   MCH  32.9 26.0 - 34.0 pg   MCHC 34.9 30.0 - 36.0 g/dL   RDW 40.9 (H) 81.1 - 91.4 %   Platelets 223 150 - 400 K/uL   nRBC 0.0 0.0 - 0.2 %    Comment: Performed at [facility], [address], [place], [place] [address]  Renal function panel     Status: Abnormal   Collection Time: 02/07/23 12:34 AM  Result Value Ref Range   Sodium 130 (L) 135 - 145 mmol/L   Potassium 3.8 3.5 - 5.1 mmol/L   Chloride 93 (L) 98 - 111 mmol/L   CO2 25 22 - 32 mmol/L   Glucose, Bld 174 (H) 70 - 99 mg/dL    Comment: Glucose reference range applies only to samples taken after fasting for at least 8 hours.   BUN 50 (H) 6 - 20 mg/dL   Creatinine, Ser 6.21 (H) 0.44 - 1.00 mg/dL   Calcium 8.4 (L) 8.9 - 10.3 mg/dL   Phosphorus 6.3 (H) 2.5 - 4.6 mg/dL   Albumin 2.1 (L) 3.5 - 5.0 g/dL   GFR, Estimated 7 (L) >60 mL/min    Comment: (NOTE) Calculated using the CKD-EPI Creatinine Equation (2021)    Anion gap 12 5 - 15    Comment: Performed at [facility], [address], [place], [place] [address]  Glucose, capillary     Status: Abnormal   Collection Time: 02/07/23  4:54 AM  Result Value Ref Range   Glucose-Capillary 186 (H) 70 - 99 mg/dL    Comment: Glucose reference range applies only to samples taken after fasting for at least 8  hours.  Iron and TIBC     Status: Abnormal   Collection Time: 02/07/23  7:36 AM  Result Value Ref Range   Iron 31 28 - 170 ug/dL   TIBC 409 (L) 811 - 914 ug/dL   Saturation Ratios 15 10.4 - 31.8 %   UIBC 173 ug/dL    Comment: Performed at [facility], [address] [place], [place] [address]  Ferritin     Status: Abnormal   Collection Time: 02/07/23  7:36 AM  Result Value Ref Range   Ferritin 622 (H) 11 - 307 ng/mL    Comment: Performed at [facility], [address], [place], [place] [address]  Glucose, capillary     Status: Abnormal   Collection Time: 02/07/23  7:52 AM  Result Value Ref Range   Glucose-Capillary 176 (H) 70 - 99 mg/dL    Comment:  Glucose reference range applies only to samples taken after fasting for at least 8 hours.  Lactic acid, plasma     Status: None   Collection Time: 02/07/23  8:14 AM  Result Value Ref Range   Lactic Acid, Venous 0.7 0.5 - 1.9 mmol/L    Comment: Performed at [facility], [address], [place], [place] [address]  Lactic acid, plasma     Status: None   Collection Time: 02/07/23 10:43 AM  Result Value Ref Range   Lactic Acid, Venous 0.7 0.5 - 1.9 mmol/L    Comment: Performed at [facility], [address] [place], [place] [address]   *Note: Due to a large number of results and/or encounters for the requested time period, some results have not been displayed. A complete set of results can be found in Results Review.   CT Angio Chest/Abd/Pel for Dissection W and/or W/WO  Result Date: 02/07/2023 CLINICAL DATA:  Abdominal pain. Recent stent graft or aortic dissection. EXAM: CT ANGIOGRAPHY CHEST, ABDOMEN AND PELVIS TECHNIQUE: Non-contrast CT of the chest was initially obtained. Multidetector CT imaging through the chest, abdomen and pelvis was performed using the standard protocol during bolus administration of intravenous contrast. Multiplanar reconstructed images and MIPs were obtained and reviewed to evaluate the vascular anatomy. RADIATION DOSE REDUCTION: This exam was performed according to the departmental dose-optimization program which includes automated exposure control, adjustment of the mA and/or kV according to patient size and/or use of iterative reconstruction technique. CONTRAST:  OMNIPAQUE IOHEXOL 350 MG/ML SOLN COMPARISON:  Multiple previous studies. FINDINGS: CTA CHEST FINDINGS Cardiovascular: The heart is borderline enlarged but stable. No pericardial effusion. Interval placement of an aortic stent graft. No worrisome complicating features are identified. The arch vessels are patent. There is some contrast flowing into the false lumen and extending down into the  abdominal aorta. Mediastinum/Nodes: No mediastinal or hilar mass or adenopathy. NG tube and feeding tube are coursing down the esophagus and into the stomach. Lungs/Pleura: Small bilateral pleural effusions and bibasilar atelectasis. No pulmonary edema or pneumothorax. Musculoskeletal: No significant bony findings. Review of the MIP images confirms the above findings. CTA ABDOMEN AND PELVIS FINDINGS VASCULAR Aorta: Upper abdominal aorta stent graft. This ends just above the celiac axis. Stable aortic dissection extending into the left common iliac, left external iliac artery and left common femoral artery. No occlusion. Celiac: Patent SMA: Patent Renals: Stable marked irregularity involving the proximal left renal artery but no occlusion. Is are enhancement pattern of both kidneys likely related to end-stage renal disease. IMA: Patent Inflow: Patent.  Stable  left-sided dissection without occlusion. Veins: No significant findings. Review of the MIP images confirms the above findings. NON-VASCULAR Hepatobiliary: No hepatic lesions or intrahepatic biliary dilatation. Contracted gallbladder with probable gallstones. Pancreas: No inflammation. Spleen: Normal size. Adrenals/Urinary Tract: Stable mild enlargement of both adrenal glands likely hyperplasia. Is are enhancement pattern of both kidneys along with mild right-sided hydronephrosis likely due to end-stage renal disease. The bladder is unremarkable. Stomach/Bowel: Dilated small bowel loops with air-fluid levels with a transition to decompressed/normal distal ileal loops of small bowel suggesting small bowel obstruction likely related to adhesions. Mild distention of the ascending colon but the rest of the colon is largely decompressed and contains some contrast. Lymphatic: Stable borderline enlarged mesenteric and retroperitoneal lymph nodes and stable right inguinal lymphadenopathy. Reproductive: The uterus and ovaries are unremarkable. Other: Small amount of free  abdominal and free pelvic fluid. Musculoskeletal: No significant bony findings. Review of the MIP images confirms the above findings. IMPRESSION: 1. Interval placement of an aortic stent graft without worrisome complicating features. 2. 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 3. Aortic branch vessels are patent. 4. Small-bowel obstruction with transition noted in the distal ileum likely due to adhesions. 5. Small bilateral pleural effusions and bibasilar atelectasis. 6. Stable borderline enlarged mesenteric and retroperitoneal lymph nodes and stable right inguinal lymphadenopathy. Electronically Signed   By: Rudie Meyer M.D.   On: 02/07/2023 13:00   DG Chest Port 1 View  Result Date: 02/07/2023 CLINICAL DATA:  SOB (shortness of breath) EXAM: PORTABLE CHEST 1 VIEW COMPARISON:  Chest x-ray 10/13/2020. FINDINGS: Low lung volumes with bibasilar opacities and probable small right pleural effusion. No visible pneumothorax. Aortic stent. Mildly enlarged cardiac silhouette, accentuated by technique. Right IJ central venous catheter with the tip projecting at the lower right atrium. Enteric and gastric tube courses below the diaphragm outside the field of view. IMPRESSION: Low lung volumes with bibasilar opacities and probable small right pleural effusion. Electronically Signed   By: Feliberto Harts M.D.   On: 02/07/2023 10:32   DG Abd 1 View  Result Date: 02/07/2023 CLINICAL DATA:  Shortness of breath. EXAM: ABDOMEN - 1 VIEW COMPARISON:  02/06/2023 FINDINGS: Persistent bibasilar collapse/consolidation, right greater than left with right-sided pleural effusion. Diffuse gaseous distension of small bowel again noted mildly progressive in the interval. Colonic distension seen on previous studies has decreased in the interval. NG tube tip is folded in the distal stomach with the tip in the antral region. Feeding tube tip is  transpyloric left the level of the ligament of Treitz. IMPRESSION: 1. Persistent bibasilar collapse/consolidation, right greater than left with right-sided pleural effusion. 2. Diffuse gaseous distension of small bowel mildly progressive in the interval. Colonic distension has decreased in the interval. Electronically Signed   By: Kennith Center M.D.   On: 02/07/2023 10:23   DG Abd Portable 1V  Result Date: 02/06/2023 CLINICAL DATA:  Evaluate feeding tube placement. EXAM: PORTABLE ABDOMEN - 1 VIEW COMPARISON:  Earlier today FINDINGS: There is a feeding tube identified within the upper abdomen which follows the normal course of the stomach and duodenal C loop with tip in the expected location of the ligament of Treitz. A second nasogastric scratch set a nasogastric tube is also noted with tip projecting over the expected location of the body of stomach. Thoracic aortic stent graft is again seen. Air-filled loops of small and large bowel appears similar to the previous exam. IMPRESSION: 1. Feeding  tube tip projects over the expected location of the ligament of Treitz. 2. Nasogastric tube tip projects over the body of the stomach. Electronically Signed   By: Signa Kell M.D.   On: 02/06/2023 14:00   DG Abd 1 View  Result Date: 02/06/2023 CLINICAL DATA:  Nasogastric tube. EXAM: ABDOMEN - 1 VIEW COMPARISON:  Abdominal radiograph 02/05/2023 FINDINGS: Interval placement of nasogastric tube which is curled in the gastric fundus with the tip directed in the caudal direction. Enteric tube with weighted tip projects at the midline abdomen, overlying the expected location of the gastric antrum. Air-filled distended loops of small bowel and colon in the visualized portions of the left and mid abdomen. Aortic stent graft. Partially visualized superior approach central venous catheter with the tip projecting just below the level of the superior cavoatrial junction. IMPRESSION: Interval placement of a nasogastric tube  which is curled in the gastric fundus, with the tip directed in the caudal direction. Electronically Signed   By: Sherron Ales M.D.   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 Karin Lieu - CTA today shows interval placement of an aortic stent graft without worrisome complicating features, as well as dilated loops of small bowel with transition noted in the distal ileum concerning for SBO. WBC is trending down at 11.6 and lactic acid WNL. She is distended on exam but no peritonitis. No indication for acute surgical intervention. Given history seems more likely to be an ileus. Agree with NG tube for bowel rest and decompression. Hold tube feedings. She was given oral contrast with her CT scan so we will follow up with a repeat abdominal film in the morning. If she is not improving over the next couple of days may need to consider surgery. We will follow.  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  Carlena Bjornstad, PA-C [facility] 02/07/2023, 1:52 PM Please see Amion for pager number during day hours 7:00am-4:30pm or 7:00am -11:30am on weekends

## 2023-02-07 NOTE — Progress Notes (Signed)
Physical Therapy Treatment Patient Details Name: [name] MRN: [id number] DOB: 1966/03/31 Today's Date: 02/07/2023   History of Present Illness 57 y.o. female admitted 4/14 with c/o back and abdominal pain with N&V. 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 4/18 thoracic endovascular aortic repair. PMH: T2DM, CKD, HIV, sarcoidosis    PT Comments    Pt pleasant despite continued pain and agreeable to attempt mobility. Pt able to walk in room with return to bed for pending CT with report of nausea improved with breathing technique. Pt continues to be limited by pain and encouraged to continue mobility as able to tolerate with nursing. Will continue to follow.   Pt with desaturation to 88% on RA with limited mobility and returned to 1L end of session at 94%     Recommendations for follow up therapy are one component of a multi-disciplinary discharge planning process, led by the attending physician.  Recommendations may be updated based on patient status, additional functional criteria and insurance authorization.  Follow Up Recommendations       Assistance Recommended at Discharge Frequent or constant Supervision/Assistance  Patient can return home with the following A little help with walking and/or transfers;A little help with bathing/dressing/bathroom;Assistance with cooking/housework;Assist for transportation;Help with stairs or ramp for entrance   Equipment Recommendations  Rolling walker (2 wheels);BSC/3in1    Recommendations for Other Services       Precautions / Restrictions Precautions Precautions: Fall;Other (comment) Precaution Comments: Cortrak, NGT, watch SPO2     Mobility  Bed Mobility Overal bed mobility: Needs Assistance Bed Mobility: Rolling, Sidelying to Sit, Sit to Sidelying Rolling: Min assist Sidelying to sit: Min assist     Sit to sidelying: Min assist General bed  mobility comments: min assist with pad to roll to left, assist to elevate trunk from surface, assist to lift legs back to surface with return to bed    Transfers Overall transfer level: Needs assistance   Transfers: Sit to/from Stand Sit to Stand: Min guard           General transfer comment: cues for hand placement and position backing to surface    Ambulation/Gait Ambulation/Gait assistance: Min guard Gait Distance (Feet): 20 Feet Assistive device: Rolling walker (2 wheels) Gait Pattern/deviations: Step-through pattern, Decreased stride length   Gait velocity interpretation: <1.31 ft/sec, indicative of household ambulator   General Gait Details: use of RW for support, min assist x 1 to direct, limited by fatigue/pain   Optometrist    Modified Rankin (Stroke Patients Only)       Balance Overall balance assessment: Needs assistance Sitting-balance support: No upper extremity supported, Feet supported Sitting balance-Leahy Scale: Good Sitting balance - Comments: EOB without support   Standing balance support: Bilateral upper extremity supported, During functional activity, Reliant on assistive device for balance Standing balance-Leahy Scale: Poor Standing balance comment: RW in standing                            Cognition Arousal/Alertness: Awake/alert Behavior During Therapy: Flat affect Overall Cognitive Status: Within Functional Limits for tasks assessed  Exercises      General Comments        Pertinent Vitals/Pain Pain Assessment Pain Score: 6  Pain Location: abdomen Pain Descriptors / Indicators: Discomfort, Aching, Constant Pain Intervention(s): Limited activity within patient's tolerance, Repositioned, Monitored during session, Premedicated before session    Home Living                          Prior Function            PT  Goals (current goals can now be found in the care plan section) Progress towards PT goals: Progressing toward goals    Frequency    Min 1X/week      PT Plan Current plan remains appropriate    Co-evaluation              AM-PAC PT "6 Clicks" Mobility   Outcome Measure  Help needed turning from your back to your side while in a flat bed without using bedrails?: A Little Help needed moving from lying on your back to sitting on the side of a flat bed without using bedrails?: A Little Help needed moving to and from a bed to a chair (including a wheelchair)?: A Little Help needed standing up from a chair using your arms (e.g., wheelchair or bedside chair)?: A Little Help needed to walk in hospital room?: A Little Help needed climbing 3-5 steps with a railing? : Total 6 Click Score: 16    End of Session   Activity Tolerance: Patient limited by pain;Patient limited by fatigue Patient left: in bed;with call bell/phone within reach;with nursing/sitter in room Nurse Communication: Mobility status PT Visit Diagnosis: Difficulty in walking, not elsewhere classified (R26.2);Muscle weakness (generalized) (M62.81);Pain     Time: 4098-1191 PT Time Calculation (min) (ACUTE ONLY): 21 min  Charges:  $Gait Training: 8-22 mins                     [name], PT Acute Rehabilitation Services Office: [phone]    [name] [name] 02/07/2023, 9:06 AM

## 2023-02-07 NOTE — Progress Notes (Signed)
Brief Nutrition Follow-Up:  Trickle TF initiated yesterday via post pyloric Cortrak but held for worsening abdominal pain/discomfort, more distention. Noted NG tube with 720 mL in 24 hours, 300 mL thus far today  Abd xray this AM with diffuse gaseous distension of small bowel again noted, mildly progressive in the interval. Colonic distension seen on previous studies has decreased in the interval. NG tube with tip in stomach, Cortrak near LOT. +some stool yesterday, smear this AM  Noted plan for CT abdomen to further evaluate. Lactic Acid 0.7 (wdl)  Pending results of CT, if unable to use enteral route within 24 hours or so, recommend initiation of concentrated TPN to meet nutritional needs (or at least partial needs) as pt with minimal nutrition since admission 9 days. Noted pt is oliguric and on iHD but TPN can be concentrated to provide least amount of volume possible  Romelle Starcher MS, RDN, LDN, CNSC Registered Dietitian 3 Clinical Nutrition RD Pager and On-Call Pager Number Located in [place]

## 2023-02-07 NOTE — Progress Notes (Signed)
OT Cancellation Note  Patient Details Name: [name] MRN: [id number] DOB: 27-Dec-1965   Cancelled Treatment:    Reason Eval/Treat Not Completed: Patient at procedure or test/ unavailable (pt going off unit for CT, RN asking therapy to hold until later today. Will follow up for OT tx as schedule permits)  Carver Fila, OTD, OTR/L SecureChat Preferred Acute Rehab [phone]   Dalphine Handing 02/07/2023, 11:07 AM

## 2023-02-07 NOTE — Progress Notes (Signed)
[facility] Progress Note    Assessment/ Plan:   # CKD stage 5 with progression to ESRD -followed by our office as an outpatient.  Started HD due to uremic symptoms. S/p [facility] 4/15. IHD #1 4/16 - HD per MWF schedule for now  - CLIP underway for outpatient HD unit placement - She states that she is no longer interested in PD.  Will need AVF/AVG and vein mapping.  Defer for today with changing abdominal exam -Follow daily labs, strict I/O  # Type B dissection -VVS following. S/p TEVAR 4/18 -BP control as below  # Encephalopathy -work up per primary service.  Per charting mentation has improved which may correlate with HD  # HTN - goals per vascular, CCM.  She has most recently been on levo - optimize volume as tolerated with HD  # HIV - therapy per primary team   # DM2 -per primary service  # Urinary retention   -UA re-ordered  # Hyponatremia - optimize with HD/UF  # N/V, abd distension -per primary team. Concern for ileus  # Normocytic Anemia  - transfusions per primary team  - update iron panel  - anticipate need for ESA  # Hyperphosphatemia  - Improving with HD  - start binder once taking PO - she has an NG tube in appears to be to suction and concern for ileus  Disposition - in the ICU.  Continue inpatient monitoring    Subjective:   She is still in the ICU.  Last HD on 4/22 with 3.5 kg UF.  She has been on levo at 4 mcg/min.  She had 500 mL uop over 4/22 charted.  She states that she is no longer interested in PD.  Spoke with her RN - the patient has been uncomfortable through the night (abdominal pain)  Review of systems:   shortness of breath is better Abdominal pain - states her abdomen initially felt better but now is hurting again     Objective:   BP 124/61   Pulse 69   Temp 98.9 F (37.2 C) (Oral)   Resp 17   Ht 5\' 5"  (1.651 m)   Wt 99.1 kg   LMP 03/31/2014 (LMP Unknown)   SpO2 94%   BMI 36.36 kg/m   Intake/Output Summary  (Last 24 hours) at 02/07/2023 0606 Last data filed at 02/07/2023 0310 Gross per 24 hour  Intake 326.31 ml  Output 4600 ml  Net -4273.69 ml   Weight change: -2.7 kg  Physical Exam:     General adult female in bed in no acute distress HEENT normocephalic atraumatic extraocular movements intact sclera anicteric Neck supple trachea midline Lungs clear to auscultation bilaterally unlabored at rest; on 2.5 liters oxygen Heart S1S2 no rub Abdomen soft some tenderness; distended/obese habitus Extremities no edema lower extremities  Psych no anxiety or agitation Neuro patient provides some history and follows commands Access: RIJ tunn catheter in place    Imaging: DG Abd Portable 1V  Result Date: 02/06/2023 CLINICAL DATA:  Evaluate feeding tube placement. EXAM: PORTABLE ABDOMEN - 1 VIEW COMPARISON:  Earlier today FINDINGS: There is a feeding tube identified within the upper abdomen which follows the normal course of the stomach and duodenal C loop with tip in the expected location of the ligament of Treitz. A second nasogastric scratch set a nasogastric tube is also noted with tip projecting over the expected location of the body of stomach. Thoracic aortic stent graft is again seen. Air-filled loops of small  and large bowel appears similar to the previous exam. IMPRESSION: 1. Feeding tube tip projects over the expected location of the ligament of Treitz. 2. Nasogastric tube tip projects over the body of the stomach. Electronically Signed   By: Signa Kell M.D.   On: 02/06/2023 14:00   DG Abd 1 View  Result Date: 02/06/2023 CLINICAL DATA:  Nasogastric tube. EXAM: ABDOMEN - 1 VIEW COMPARISON:  Abdominal radiograph 02/05/2023 FINDINGS: Interval placement of nasogastric tube which is curled in the gastric fundus with the tip directed in the caudal direction. Enteric tube with weighted tip projects at the midline abdomen, overlying the expected location of the gastric antrum. Air-filled distended  loops of small bowel and colon in the visualized portions of the left and mid abdomen. Aortic stent graft. Partially visualized superior approach central venous catheter with the tip projecting just below the level of the superior cavoatrial junction. IMPRESSION: Interval placement of a nasogastric tube which is curled in the gastric fundus, with the tip directed in the caudal direction. Electronically Signed   By: Sherron Ales M.D.   On: 02/06/2023 12:28   DG Abd 1 View  Result Date: 02/05/2023 CLINICAL DATA:  [id number] Ileus 98749 EXAM: ABDOMEN - 1 VIEW COMPARISON:  CT 02/02/2023 FINDINGS: Descending thoracic aortic stent graft has been placed, proximal aspect not visualized. Feeding tube extends to the pylorus. A few gas filled small bowel loops in the left abdomen. Moderate gaseous distention of the colon. Residual contrast material in the urinary bladder. IMPRESSION: 1. Nonspecific gaseous distention of small bowel and colon. 2. Descending thoracic aortic stent graft. Electronically Signed   By: Corlis Leak M.D.   On: 02/05/2023 08:54    Labs: BMET Recent Labs  Lab 02/01/23 1610 02/02/23 0423 02/02/23 0854 02/03/23 0419 02/04/23 0309 02/05/23 0128 02/05/23 0129 02/06/23 0042 02/07/23 0034  NA 129*  --  128* 129* 129*  --  131* 128* 130*  K 4.4  --  4.0 4.1 3.6  --  3.6 4.3 3.8  CL 94*  --  91* 93* 94*  --  95* 93* 93*  CO2 17*  --  21* 20* 24  --  23 19* 25  GLUCOSE 150*  --  168* 145* 169*  --  159* 113* 174*  BUN 58*  --  45* 53* 39*  --  55* 71* 50*  CREATININE 8.70*  --  7.71* 8.45* 6.68*  --  7.83* 9.08* 6.71*  CALCIUM 7.4*  --  7.5* 7.2* 7.4*  --  8.0* 8.2* 8.4*  PHOS 9.0* 7.6*  --  9.9* 6.0* 7.0* 7.0* 8.8* 6.3*   CBC Recent Labs  Lab 02/04/23 0309 02/05/23 0128 02/06/23 0042 02/07/23 0034  WBC 18.7* 19.8* 16.7* 11.6*  HGB 8.3* 7.9* 8.3* 8.3*  HCT 23.4* 24.0* 24.7* 23.8*  MCV 94.4 97.2 96.5 94.4  PLT 195 196 238 223    Medications:     amLODipine  10 mg Per Tube  Daily   aspirin  81 mg Per Tube Daily   bisacodyl  10 mg Rectal Q0600   carvedilol  25 mg Per Tube BID   Chlorhexidine Gluconate Cloth  6 each Topical Daily   Chlorhexidine Gluconate Cloth  6 each Topical Q0600   dolutegravir  50 mg Per Tube Daily   emtricitabine-tenofovir AF  1 tablet Per Tube Daily   famotidine  20 mg Per Tube Daily   feeding supplement (PROSource TF20)  60 mL Per Tube Daily   heparin  5,000 Units Subcutaneous Q8H   hydrALAZINE  100 mg Per Tube Q8H   insulin aspart  0-15 Units Subcutaneous Q4H   levothyroxine  175 mcg Per Tube Q0600   lidocaine  1 patch Transdermal Q24H   losartan  100 mg Per Tube Daily   metoCLOPramide (REGLAN) injection  5 mg Intravenous Q12H   multivitamin  1 tablet Per Tube QHS   mouth rinse  15 mL Mouth Rinse 4 times per day   rosuvastatin  10 mg Per Tube Daily   sertraline  50 mg Per Tube Daily     Estanislado Emms, MD 6:25 AM 02/07/2023

## 2023-02-07 NOTE — Progress Notes (Signed)
02/07/2023 Questionable SBO, will ask [facility] if they can try their SBO protocol.  Jesusita Oka

## 2023-02-07 NOTE — Progress Notes (Signed)
   NAME:  [name], MRN:  [id number], DOB:  [birth year], LOS: 9 ADMISSION DATE:  01/29/2023, CONSULTATION DATE:  01/29/23 REFERRING MD:  EDP, CHIEF COMPLAINT:  aortic dissection   History of Present Illness:  58 yo female with pmh t2dm, ckd4, hiv, sarcoidosis who prsented to [facility] with complaints of back and abdominal pain with nausea and non bloody vomiting. Her family at bedside endorse this has been ongoing for 2 days not. Family provides most history as pt is quite drowsy after pain medication. The state that pt had no other associated symptoms. She had been trying to treat herself for consitpation thinking that was the cause of her pain. No fevers/ chills. No diarrhea. No recent change in medications.   She presented with elevated BP. Systolics >200. She was started on esmolol and nitro gtts and vascular is coming to eval.   Pertinent  Medical History  T2dm Htn Hyperlipidemia Hiv Hypothyroidism ckd4  Significant Hospital Events: Including procedures, antibiotic start and stop dates in addition to other pertinent events   Admitted to hospital 4/14, on cardene and esmolol 4/15 TDC placement, uremic encephalopathy 4/18 CT angio c/a/b, AO bifem. TEVAR  4/19   Interim History / Subjective:  Still on low dose levophed. Ongoing abd pain, intolerance to PO.  Objective   Blood pressure 131/63, pulse 71, temperature 98.9 F (37.2 C), temperature source Oral, resp. rate 19, height  (1.651 m), weight 99.1 kg, last menstrual period 03/31/2014, SpO2 94 %.        Intake/Output Summary (Last 24 hours) at 02/07/2023 1029 Last data filed at 02/07/2023 0900 Gross per 24 hour  Intake 1615.18 ml  Output 5020 ml  Net -3404.82 ml    Filed Weights   02/05/23 0600 02/06/23 0645 02/07/23 0500  Weight: 102.7 kg 101.8 kg 99.1 kg    Examination: No distress [place]atti 4 Ongoing abd distension, TTP, hypoactive BS Lower ext doing okay, neurovascularly intact Lungs  diminished bases Anasarca stable  Lactate ok CBC holding steady BMP improved after HD  Resolved Hospital Problem list   Acute encephalopathy  Assessment & Plan:   Type B dissection s/p TEVAR (4/18) -- proximal descending thoracic aorta distal to origin of L subclav to iliacs, extending LLE to bifurcation of superficial and profunda fem arteries  Hx HTN Ongoing abd pain, dilated bowel loops- suspect just ileus but will recheck angio Currently, low Bps+ LE parasthesia- better with augmentation - Levophed to MAP 70-80 - Check lactate - Stat CTA recheck aorta protocol - Continue neurovacular checks - Try to limit opiates if able: she is pretty miserable, going to give standing tylenol and some fent PRN  High suspicion OSA- needs OP sleep referral  AKI on CKD 5 -- now ESRD  Hyponatremia  P -iHD per nephro  DM2   -SSI   HIV; well controlled recently with undetectable VL and CD4 685 in 12/2022 -cont tivicay, descovy; on hold due to ileus unfortunately  Hypothyroidism- last TSH 2.6 08/23/22 -synthroid   GERD -PPI   Best Practice (right click and "Reselect all SmartList Selections" daily)   Diet/type: NPO  DVT prophylaxis: heparin GI prophylaxis: PPI Lines: Dialysis Catheter Foley:  N/A Code Status:  full code Last date of multidisciplinary goals of care discussion [patient updated 4/23]  33 min cc time  Myrla Halsted MD PCCM

## 2023-02-07 NOTE — Progress Notes (Addendum)
Occupational Therapy Treatment Patient Details Name: [name] MRN: [id number] DOB: [birth year] Today's Date: 02/07/2023   History of present illness 57 y.o. female admitted 4/14 with c/o back and abdominal pain with N&V. 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 4/18 thoracic endovascular aortic repair. PMH: T2DM, CKD, HIV, sarcoidosis   OT comments  Pt making slow progression toward goals this session, needing mod A for UB dressing and min guard for simulated toilet transfer with RW. Pt min A for bed mobility, pt lethargic although sleeping upon arrival, noted slowed processing and needing increased cues/time for command follow. Pt presenting with impairments listed below, will follow acutely. Continue to recommend HHOT at d/c pending progression.   Recommendations for follow up therapy are one component of a multi-disciplinary discharge planning process, led by the attending physician.  Recommendations may be updated based on patient status, additional functional criteria and insurance authorization.    Assistance Recommended at Discharge Intermittent Supervision/Assistance  Patient can return home with the following  A little help with walking and/or transfers;A lot of help with bathing/dressing/bathroom;Assistance with cooking/housework;Direct supervision/assist for medications management;Direct supervision/assist for financial management;Assist for transportation;Help with stairs or ramp for entrance   Equipment Recommendations  Tub/shower bench;Other (comment) (RW)    Recommendations for Other Services PT consult    Precautions / Restrictions Precautions Precautions: Fall;Other (comment) Precaution Comments: Cortrak, NGT, watch SPO2 Restrictions Weight Bearing Restrictions: No       Mobility Bed Mobility Overal bed mobility: Needs Assistance Bed Mobility: Sidelying to Sit   Sidelying to  sit: Min assist       General bed mobility comments: increased time  and cues to scoot to EOB    Transfers Overall transfer level: Needs assistance Equipment used: Rolling walker (2 wheels) Transfers: Sit to/from Stand Sit to Stand: Min guard     Step pivot transfers: Min guard           Balance Overall balance assessment: Needs assistance Sitting-balance support: No upper extremity supported, Feet supported Sitting balance-Leahy Scale: Good Sitting balance - Comments: EOB without support   Standing balance support: Bilateral upper extremity supported, During functional activity, Reliant on assistive device for balance Standing balance-Leahy Scale: Poor Standing balance comment: reliant on RW support in standing                           ADL either performed or assessed with clinical judgement   ADL Overall ADL's : Needs assistance/impaired                 Upper Body Dressing : Sitting;Moderate assistance Upper Body Dressing Details (indicate cue type and reason): donning clean gown     Toilet Transfer: Min guard;Stand-pivot;Rolling walker (2 wheels);BSC/3in1 Toilet Transfer Details (indicate cue type and reason): simulated to chair         Functional mobility during ADLs: Min guard;Rolling walker (2 wheels)      Extremity/Trunk Assessment Upper Extremity Assessment Upper Extremity Assessment: RUE deficits/detail RUE Deficits / Details: 3/5 shoulder ROM, grasp WFL, pt reports functional for BADL RUE Coordination: decreased fine motor;decreased gross motor   Lower Extremity Assessment Lower Extremity Assessment: Defer to PT evaluation        Vision   Vision Assessment?: No apparent visual deficits   Perception Perception Perception: Not tested   Praxis Praxis Praxis: Not tested    Cognition  Arousal/Alertness: Lethargic Behavior During Therapy: Flat affect Overall Cognitive Status: Within Functional Limits for tasks assessed                                  General Comments: slowed processing, needs cues for initiation, lethargic although pt asleep upon arrival. Able to dial cell phone number and recall phone is on vibrate so it will not ring        Exercises      Shoulder Instructions       General Comments VSS on 3L O2; NG to suction throughout session, RN aware    Pertinent Vitals/ Pain       Pain Assessment Pain Assessment: Faces Pain Score: 4  Faces Pain Scale: Hurts little more Pain Location: abdomen Pain Descriptors / Indicators: Discomfort, Aching, Constant Pain Intervention(s): Limited activity within patient's tolerance, Monitored during session, Repositioned  Home Living                                          Prior Functioning/Environment              Frequency  Min 1X/week        Progress Toward Goals  OT Goals(current goals can now be found in the care plan section)  Progress towards OT goals: Progressing toward goals  Acute Rehab OT Goals Patient Stated Goal: none stated OT Goal Formulation: With patient Time For Goal Achievement: 02/19/23 Potential to Achieve Goals: Good ADL Goals Pt Will Perform Upper Body Dressing: with supervision;sitting Pt Will Perform Lower Body Dressing: with supervision;sit to/from stand;sitting/lateral leans Pt Will Transfer to Toilet: with supervision;ambulating;regular height toilet Pt Will Perform Tub/Shower Transfer: Tub transfer;Shower transfer;with supervision;ambulating;rolling walker  Plan Discharge plan remains appropriate;Frequency remains appropriate    Co-evaluation                 AM-PAC OT "6 Clicks" Daily Activity     Outcome Measure   Help from another person eating meals?: A Little Help from another person taking care of personal grooming?: A Little Help from another person toileting, which includes using toliet, bedpan, or urinal?: A Little Help from another person bathing  (including washing, rinsing, drying)?: A Lot Help from another person to put on and taking off regular upper body clothing?: A Lot Help from another person to put on and taking off regular lower body clothing?: A Lot 6 Click Score: 15    End of Session Equipment Utilized During Treatment: Gait belt;Rolling walker (2 wheels);Oxygen (3L)  OT Visit Diagnosis: Unsteadiness on feet (R26.81);Other abnormalities of gait and mobility (R26.89);Muscle weakness (generalized) (M62.81)   Activity Tolerance Patient tolerated treatment well   Patient Left in chair;with call bell/phone within reach;with chair alarm set;with family/visitor present   Nurse Communication Mobility status (confirmed with RN pt can have ice water)        Time: 1510-1535 OT Time Calculation (min): 25 min  Charges: OT General Charges $OT Visit: 1 Visit OT Treatments $Self Care/Home Management : 8-22 mins $Therapeutic Activity: 8-22 mins  Carver Fila, OTD, OTR/L SecureChat Preferred Acute Rehab [phone]   Carver Fila Koonce 02/07/2023, 3:49 PM

## 2023-02-07 NOTE — Progress Notes (Addendum)
  Progress Note    02/07/2023 7:42 AM 5 Days Post-Op  Subjective:  more abd distension overnight and this morning   Vitals:   02/07/23 0630 02/07/23 0700  BP: 125/65 127/61  Pulse: 72 70  Resp: 19 13  Temp:    SpO2: 94% 94%   Physical Exam: Lungs:  non labored Incisions:  R groin healed Extremities:  palpable and symmetrical DP pulses Abdomen:  distended but soft Neurologic: A&O  CBC    Component Value Date/Time   WBC 11.6 (H) 02/07/2023 0034   RBC 2.52 (L) 02/07/2023 0034   HGB 8.3 (L) 02/07/2023 0034   HGB 11.7 (L) 07/27/2021 1110   HCT 23.8 (L) 02/07/2023 0034   PLT 223 02/07/2023 0034   PLT 228 07/27/2021 1110   MCV 94.4 02/07/2023 0034   MCH 32.9 02/07/2023 0034   MCHC 34.9 02/07/2023 0034   RDW 16.1 (H) 02/07/2023 0034   LYMPHSABS 1.6 12/19/2022 1242   MONOABS 0.7 12/19/2022 1242   EOSABS 0.2 12/19/2022 1242   BASOSABS 0.0 12/19/2022 1242    BMET    Component Value Date/Time   NA 130 (L) 02/07/2023 0034   NA 142 12/05/2022 0000   K 3.8 02/07/2023 0034   CL 93 (L) 02/07/2023 0034   CO2 25 02/07/2023 0034   GLUCOSE 174 (H) 02/07/2023 0034   BUN 50 (H) 02/07/2023 0034   BUN 44 (A) 12/05/2022 0000   CREATININE 6.71 (H) 02/07/2023 0034   CREATININE 2.72 (H) 07/27/2021 1110   CREATININE 2.74 (H) 03/17/2021 1050   CALCIUM 8.4 (L) 02/07/2023 0034   GFRNONAA 7 (L) 02/07/2023 0034   GFRNONAA 20 (L) 07/27/2021 1110   GFRAA 39 (L) 07/14/2020 1053   GFRAA 33 (L) 08/12/2019 1447    INR    Component Value Date/Time   INR 1.0 07/14/2019 0312     Intake/Output Summary (Last 24 hours) at 02/07/2023 0742 Last data filed at 02/07/2023 6962 Gross per 24 hour  Intake 1111.64 ml  Output 4720 ml  Net -3608.36 ml     Assessment/Plan:  57 y.o. female is s/p TEVAR 5 Days Post-Op   BLE well perfused with symmetrical DP pulses; motor and sensation of BLE unchanged R groin incision healed GI: abd more distented this morning, continue NPO, defer to primary  team AVF vs AVG when more stable; vein mapping ordered   Emilie Rutter, PA-C [facility] [phone] 02/07/2023 7:42 AM  VASCULAR STAFF ADDENDUM: I have independently interviewed and examined the patient. I agree with the above.  Abdominal pain this morning. Palpable lower extremities, neuro intact. Post-TEVAR imaging demonstrated Celiac, SMA filling. Will scan stat to ensure no dissection sequela.  NPO Map goal under  J. Gillis Santa, MD [facility] Phone Number: [phone] 02/07/2023 10:23 AM

## 2023-02-07 NOTE — Progress Notes (Addendum)
eLink Physician-Brief Progress Note Patient Name: [name] DOB: 18-Nov-1965 MRN: [id number]   Date of Service  02/07/2023  HPI/Events of Note  57 year old female initially presented with a type aortic dissection with hypertensive emergency, eventually underwent a TEVAR on 4/18, has been experiencing renal failure on intermittent hemodialysis.  Developed an ileus on 4/21.  Mental status is improved.  Feels like she is distended and has gas pain  eICU Interventions  Will attempt simethicone, unclear whether this will be helpful in the setting of her underlying ileus.   6578 -ongoing discomfort, tube feeds turned off, continue NG tube to low intermittent suction.  Diffuse bowel gas pattern on abdominal x-ray.  Atelectasis noted on chest radiograph.  Intervention Category Minor Interventions: Routine modifications to care plan (e.g. PRN medications for pain, fever)  Burnell Hurta 02/07/2023, 2:48 AM

## 2023-02-08 ENCOUNTER — Inpatient Hospital Stay (HOSPITAL_COMMUNITY): Payer: Medicare Other

## 2023-02-08 DIAGNOSIS — I7102 Dissection of abdominal aorta: Secondary | ICD-10-CM | POA: Diagnosis not present

## 2023-02-08 DIAGNOSIS — E440 Moderate protein-calorie malnutrition: Secondary | ICD-10-CM

## 2023-02-08 DIAGNOSIS — N179 Acute kidney failure, unspecified: Secondary | ICD-10-CM | POA: Diagnosis not present

## 2023-02-08 HISTORY — DX: Moderate protein-calorie malnutrition: E44.0

## 2023-02-08 LAB — CBC
HCT: 24.3 % — ABNORMAL LOW (ref 36.0–46.0)
Hemoglobin: 8.4 g/dL — ABNORMAL LOW (ref 12.0–15.0)
MCH: 32.6 pg (ref 26.0–34.0)
MCHC: 34.6 g/dL (ref 30.0–36.0)
MCV: 94.2 fL (ref 80.0–100.0)
Platelets: 188 10*3/uL (ref 150–400)
RBC: 2.58 MIL/uL — ABNORMAL LOW (ref 3.87–5.11)
RDW: 16.1 % — ABNORMAL HIGH (ref 11.5–15.5)
WBC: 7.1 10*3/uL (ref 4.0–10.5)
nRBC: 0 % (ref 0.0–0.2)

## 2023-02-08 LAB — RENAL FUNCTION PANEL
Albumin: 2.2 g/dL — ABNORMAL LOW (ref 3.5–5.0)
Anion gap: 17 — ABNORMAL HIGH (ref 5–15)
BUN: 72 mg/dL — ABNORMAL HIGH (ref 6–20)
CO2: 22 mmol/L (ref 22–32)
Calcium: 8.7 mg/dL — ABNORMAL LOW (ref 8.9–10.3)
Chloride: 88 mmol/L — ABNORMAL LOW (ref 98–111)
Creatinine, Ser: 9.05 mg/dL — ABNORMAL HIGH (ref 0.44–1.00)
GFR, Estimated: 5 mL/min — ABNORMAL LOW (ref >60)
Glucose, Bld: 85 mg/dL (ref 70–99)
Phosphorus: 8.4 mg/dL — ABNORMAL HIGH (ref 2.5–4.6)
Potassium: 4.4 mmol/L (ref 3.5–5.1)
Sodium: 127 mmol/L — ABNORMAL LOW (ref 135–145)

## 2023-02-08 LAB — GLUCOSE, CAPILLARY
Glucose-Capillary: 91 mg/dL (ref 70–99)
Glucose-Capillary: 93 mg/dL (ref 70–99)
Glucose-Capillary: 112 mg/dL — ABNORMAL HIGH (ref 70–99)
Glucose-Capillary: 105 mg/dL — ABNORMAL HIGH (ref 70–99)
Glucose-Capillary: 131 mg/dL — ABNORMAL HIGH (ref 70–99)

## 2023-02-08 MED ORDER — SODIUM CHLORIDE 0.9 % IV SOLN
250.0000 mg | Freq: Every day | INTRAVENOUS | Status: AC
  Administered 2023-02-08 – 2023-02-09 (×2): 250 mg via INTRAVENOUS
  Filled 2023-02-08 (×2): qty 20

## 2023-02-08 MED ORDER — ALTEPLASE 2 MG IJ SOLR: 2.0000 mg | Freq: Once | INTRAMUSCULAR | Status: DC | PRN

## 2023-02-08 MED ORDER — DEXTROSE 10 % IV SOLN: INTRAVENOUS | Status: DC

## 2023-02-08 MED ORDER — ANTICOAGULANT SODIUM CITRATE 4% (200MG/5ML) IV SOLN
5.0000 mL | Status: DC | PRN
  Filled 2023-02-08: qty 5

## 2023-02-08 MED ORDER — CARVEDILOL 12.5 MG PO TABS
12.5000 mg | ORAL_TABLET | Freq: Two times a day (BID) | ORAL | Status: DC
  Administered 2023-02-08 (×2): 12.5 mg
  Filled 2023-02-08 (×3): qty 1

## 2023-02-08 MED ORDER — DARBEPOETIN ALFA 40 MCG/0.4ML IJ SOSY
40.0000 ug | PREFILLED_SYRINGE | Freq: Once | INTRAMUSCULAR | Status: AC
  Administered 2023-02-08: 40 ug via SUBCUTANEOUS
  Filled 2023-02-08: qty 0.4

## 2023-02-08 MED ORDER — HEPARIN SODIUM (PORCINE) 1000 UNIT/ML DIALYSIS
1000.0000 [IU] | INTRAMUSCULAR | Status: DC | PRN
  Administered 2023-02-08: 1000 [IU]
  Administered 2023-02-10: 4800 [IU]
  Administered 2023-02-14: 3800 [IU]
  Filled 2023-02-08 (×4): qty 1

## 2023-02-08 MED ORDER — ALBUMIN HUMAN 25 % IV SOLN
INTRAVENOUS | Status: AC
  Filled 2023-02-08: qty 50

## 2023-02-08 MED ORDER — TRAVASOL 10 % IV SOLN
INTRAVENOUS | Status: AC
  Filled 2023-02-08: qty 345.6

## 2023-02-08 MED ORDER — ALBUMIN HUMAN 25 % IV SOLN
INTRAVENOUS | Status: AC
  Administered 2023-02-08: 25 g via INTRAVENOUS
  Filled 2023-02-08: qty 50

## 2023-02-08 MED ORDER — CLOTRIMAZOLE 10 MG MT TROC
10.0000 mg | Freq: Every day | OROMUCOSAL | Status: DC
  Administered 2023-02-08 – 2023-02-09 (×8): 10 mg via ORAL
  Filled 2023-02-08 (×13): qty 1

## 2023-02-08 MED ORDER — ALBUMIN HUMAN 25 % IV SOLN: 25.0000 g | Freq: Once | INTRAVENOUS | Status: AC

## 2023-02-08 NOTE — TOC Progression Note (Signed)
Transition of Care [facility]) - Progression Note    Patient Details  Name: [name] MRN: [id number] Date of Birth: 04/02/1966  [facility] CM/SW Contact  Graves-Bigelow, Lamar Laundry, RN Phone Number: 02/08/2023, 1:25 PM  Clinical Narrative:  Patient was discussed in progression rounds. Case Manager spoke with patient and mom at the bedside. Patient is from home with the support of daughter. Patient has PCP and gets to appointments without any issues. Case Manager received a consult for LTAC facility. Case Manager sent a secure chat to the MD to see if that's how he wanted to proceed. Patient wants to return home. Case Manager will continue to follow for transition of care needs as the patient progresses.   Expected Discharge Plan: Home w Home Health Services Barriers to Discharge: Continued Medical Work up  Expected Discharge Plan and Services   Discharge Planning Services: CM Consult    Social Determinants of Health (SDOH) Interventions SDOH Screenings   Food Insecurity: No Food Insecurity (02/03/2023)  Housing: Low Risk  (02/03/2023)  Transportation Needs: No Transportation Needs (02/03/2023)  Utilities: Not At Risk (02/03/2023)  Depression (PHQ2-9): Low Risk  (06/09/2022)  Tobacco Use: Medium Risk (02/04/2023)    Readmission Risk Interventions     No data to display

## 2023-02-08 NOTE — Plan of Care (Signed)

## 2023-02-08 NOTE — Progress Notes (Signed)
Nutrition Follow-up  DOCUMENTATION CODES:   Non-severe (moderate) malnutrition in context of acute illness/injury  INTERVENTION:   Discussed nutrition poc with Dr. Katrinka Blazing and team and plan for TPN initiation today given inadequate nutrition x 10 days  TPN to meet nutritional needs. RD discussed pt with TPN Pharmacist   NUTRITION DIAGNOSIS:   Moderate Malnutrition related to acute illness as evidenced by energy intake < or equal to 50% for > or equal to 5 days, mild muscle depletion, moderate muscle depletion.  Being addressed via TPN  GOAL:   Patient will meet greater than or equal to 90% of their needs  Progressing  MONITOR:   Diet advancement, TF tolerance, Labs, Weight trends  REASON FOR ASSESSMENT:   Rounds    ASSESSMENT:   57 yo female admitted with Type B aortic dissection, AKI on CKD 4-progressed to ESRD and iHD initiated. PMH DM, CKD 4, HIV, sarcoidosis, HTN, HLD  4/15 TDC placed 4/16 1st iHD 4/17 2nd iHD, Cortrak placed, trickle TF 4/18 OR: thoracic, mesenteric, infrarenal abdominal angiograms, thoracic endovascular aortic repair 4/19 3rd iHD, TF titrated to 40 ml/hr 4/21 Abd xray with non specific distention of SB and colon, NPO 4/23 CT angio C/A/P with dilated small bowel loops with air-fluid levels with a transition to decompressed/normal distal ileal loops of small bowel suggesting small bowel obstruction likely related to adhesions. Mild distention of the ascending colon but the rest of the colon is largely decompressed and contains some contrast;   Surgery consulted yesterday, no plans for surgical intervention at this time. Per Surgery, favoring ileus or partial SBO as contrast in colon  Received iHD this AM, difficulty tolerating, levophed restarted and minimal net UF  Remains NPO.Abd xray this AM with persistent SB distention  >5L out via NG tube in 24 hours, noted NG retracted for better placement per RN Cortrak remains in place  Pt indicates  she feels better post gastric decompression but still draining output from NG currently  Labs: sodium 127 (L), Creatinine 9.05, BUN 72, phosphorus 8.4 (L) Meds: ferric gluconate, ss novolog, mag sulfate, reglan  Diet Order:   Diet Order             Diet NPO time specified Except for: Sips with Meds  Diet effective now                   EDUCATION NEEDS:   Education needs have been addressed  Skin:  Skin Assessment: Reviewed RN Assessment  Last BM:  4/22 small smear; medium stool on 4/21  Height:   Ht Readings from Last 1 Encounters:  01/28/23  (1.651 m)    Weight:   Wt Readings from Last 1 Encounters:  02/08/23 92.9 kg    BMI:  Body mass index is 34.08 kg/m.  Estimated Nutritional Needs:   Kcal:  1750-1950 kcals  Protein:  75-85 g  Fluid:  1L plus UOP   Romelle Starcher MS, RDN, LDN, CNSC Registered Dietitian 3 Clinical Nutrition RD Pager and On-Call Pager Number Located in [place]

## 2023-02-08 NOTE — Progress Notes (Signed)
Upper extremity vein map attempted. Patient receiving RN care then physical therapy. Will attempt again as schedule and patient availability permits.   Jean Rosenthal, RDMS, RVT

## 2023-02-08 NOTE — Progress Notes (Signed)
Large bore Nasogastric Tube retracted 8cm

## 2023-02-08 NOTE — Progress Notes (Signed)
POST HD TX NOTE  02/08/23 1105  Vitals  Temp 98.5 F (36.9 C)  Temp Source Oral  BP (!) 108/45  MAP (mmHg) 65  BP Location Right Arm  BP Method Automatic  Patient Position (if appropriate) Lying  Pulse Rate 73  Pulse Rate Source Monitor  ECG Heart Rate 74  Resp 18  Oxygen Therapy  SpO2 92 %  O2 Device Nasal Cannula  O2 Flow Rate (L/min) 2 L/min  Pulse Oximetry Type Continuous  During Treatment Monitoring  Intra-Hemodialysis Comments (S)   (post HD tx VS check)  Post Treatment  Dialyzer Clearance Clear  Duration of HD Treatment -hour(s) 3 hour(s)  Hemodialysis Intake (mL) 350 mL (Ferric Gluconate + Albumin = )  Liters Processed 72  Fluid Removed (mL) 50 mL ( (value from machine) - (Ferric gluconate) - (albumin) = 50ml)  Tolerated HD Treatment (S)  No (Comment) (bp started dropping, primary RN had to re start levo, kept increasing, i had to call for albumin and at that time UF goal was changed to keep even)  Post-Hemodialysis Comments (S)  tx completed w/ bp issues throughout tx until UF goal was changed. bp dropped early on in tx, primary RN re started levo, kept increasing but bp not responding appropriately, i called MD to make aware and ask for order for Albumin, received albumin order and MD told me to change UF goal to keep even at that time. as at that time i had already pull almost off. after accounting for meds admin pt ended up taking off 50ml. blood rinsed back, VSS. Medication Admin: Ferric Gluconate  IVBP, Albumin 25% 25G IVBP, Heparin Dwell 3800 units  Hemodialysis Catheter Right Internal jugular Double lumen Permanent (Tunneled)  Placement Date/Time: 01/30/23 1606   Serial / Lot #: [id number]  Expiration Date: 01/09/27  Time Out: Correct patient;Correct site;Correct procedure  Maximum sterile barrier precautions: Hand hygiene;Cap;Mask;Sterile gown;Sterile gloves;Large sterile ...  Site Condition No complications  Blue  Lumen Status Heparin locked;Dead end cap in place  Red Lumen Status Heparin locked;Dead end cap in place  Purple Lumen Status N/A  Catheter fill solution Heparin 1000 units/ml  Catheter fill volume (Arterial) 1.9 cc  Catheter fill volume (Venous) 1.9  Dressing Type Transparent  Dressing Status Antimicrobial disc in place;Clean, Dry, Intact  Drainage Description None  Dressing Change Due 02/13/23  Post treatment catheter status Capped and Clamped

## 2023-02-08 NOTE — Progress Notes (Signed)
eLink Physician-Brief Progress Note Patient Name: [name] DOB: January 15, 1966 MRN: [id number]   Date of Service  02/08/2023  HPI/Events of Note  57 year old female that initially presented with type a dissection with hypertensive emergency status post TEVAR on 4/18 and renal failure on hemodialysis with an ileus versus small bowel obstruction in place.  Currently n.p.o. downtrending CBG.  eICU Interventions  Add low-dose D10 drip.     Intervention Category Minor Interventions: Routine modifications to care plan (e.g. PRN medications for pain, fever)  [name] 02/08/2023, 12:13 AM

## 2023-02-08 NOTE — Progress Notes (Signed)
[facility] Progress Note  6 Days Post-Op  Subjective: CC:  Reports a significant decrease in abdominal pain compared to yesterday. States she had flatus over the weekend but has not had any this week. Currently undergoing HD.   Objective: Vital signs in last 24 hours: Temp:  [98 F (36.7 C)-98.9 F (37.2 C)] 98.5 F (36.9 C) (04/24 0400) Pulse Rate:  [63-71] 65 (04/24 0700) Resp:  [13-23] 15 (04/24 0700) BP: (98-140)/(45-63) 103/51 (04/24 0700) SpO2:  [88 %-98 %] 94 % (04/24 0700) Weight:  [92.9 kg] 92.9 kg (04/24 0500) Last BM Date : 02/06/23  Intake/Output from previous day: 04/23 0701 - 04/24 0700 In: 1505.6 [I.V.:138.6; NG/GT:967; IV Piggyback:400] Out: 5025 [Emesis/NG output:5025] Intake/Output this shift: No intake/output data recorded.  PE: Gen:  Alert, NAD, pleasant Card:  Regular rate and rhythm, pedal pulses 2+ BL Pulm:  Normal effort, clear to auscultation bilaterally Abd: Soft, minimally tender without guarding, mild distention,  incisions C/D/I  NGT in R nare with dark bilious effluent  Cortrak in L nare, clamped. Skin: warm and dry, no rashes  Psych: A&Ox3   Lab Results:  Recent Labs    02/07/23 0034 02/08/23 0043  WBC 11.6* 7.1  HGB 8.3* 8.4*  HCT 23.8* 24.3*  PLT 223 188   BMET Recent Labs    02/07/23 0034 02/08/23 0043  NA 130* 127*  K 3.8 4.4  CL 93* 88*  CO2 25 22  GLUCOSE 174* 85  BUN 50* 72*  CREATININE 6.71* 9.05*  CALCIUM 8.4* 8.7*   PT/INR No results for input(s): "LABPROT", "INR" in the last 72 hours. CMP     Component Value Date/Time   NA 127 (L) 02/08/2023 0043   NA 142 12/05/2022 0000   K 4.4 02/08/2023 0043   CL 88 (L) 02/08/2023 0043   CO2 22 02/08/2023 0043   GLUCOSE 85 02/08/2023 0043   BUN 72 (H) 02/08/2023 0043   BUN 44 (A) 12/05/2022 0000   CREATININE 9.05 (H) 02/08/2023 0043   CREATININE 2.72 (H) 07/27/2021 1110   CREATININE 2.74 (H) 03/17/2021 1050   CALCIUM 8.7 (L) 02/08/2023 0043   PROT  6.5 02/04/2023 0309   ALBUMIN 2.2 (L) 02/08/2023 0043   AST 19 02/04/2023 0309   AST 28 07/27/2021 1110   ALT 6 02/04/2023 0309   ALT 19 07/27/2021 1110   ALKPHOS 69 02/04/2023 0309   BILITOT 0.3 02/04/2023 0309   BILITOT 0.3 07/27/2021 1110   GFRNONAA 5 (L) 02/08/2023 0043   GFRNONAA 20 (L) 07/27/2021 1110   GFRAA 39 (L) 07/14/2020 1053   GFRAA 33 (L) 08/12/2019 1447   Lipase     Component Value Date/Time   LIPASE 51 01/28/2023 2358       Studies/Results: CT Angio Chest/Abd/Pel for Dissection W and/or W/WO  Result Date: 02/07/2023 CLINICAL DATA:  Abdominal pain. Recent stent graft or aortic dissection. EXAM: CT ANGIOGRAPHY CHEST, ABDOMEN AND PELVIS TECHNIQUE: Non-contrast CT of the chest was initially obtained. Multidetector CT imaging through the chest, abdomen and pelvis was performed using the standard protocol during bolus administration of intravenous contrast. Multiplanar reconstructed images and MIPs were obtained and reviewed to evaluate the vascular anatomy. RADIATION DOSE REDUCTION: This exam was performed according to the departmental dose-optimization program which includes automated exposure control, adjustment of the mA and/or kV according to patient size and/or use of iterative reconstruction technique. CONTRAST:  OMNIPAQUE IOHEXOL 350 MG/ML SOLN COMPARISON:  Multiple previous studies. FINDINGS: CTA CHEST FINDINGS Cardiovascular:  The heart is borderline enlarged but stable. No pericardial effusion. Interval placement of an aortic stent graft. No worrisome complicating features are identified. The arch vessels are patent. There is some contrast flowing into the false lumen and extending down into the abdominal aorta. Mediastinum/Nodes: No mediastinal or hilar mass or adenopathy. NG tube and feeding tube are coursing down the esophagus and into the stomach. Lungs/Pleura: Small bilateral pleural effusions and bibasilar atelectasis. No pulmonary edema or pneumothorax.  Musculoskeletal: No significant bony findings. Review of the MIP images confirms the above findings. CTA ABDOMEN AND PELVIS FINDINGS VASCULAR Aorta: Upper abdominal aorta stent graft. This ends just above the celiac axis. Stable aortic dissection extending into the left common iliac, left external iliac artery and left common femoral artery. No occlusion. Celiac: Patent SMA: Patent Renals: Stable marked irregularity involving the proximal left renal artery but no occlusion. Is are enhancement pattern of both kidneys likely related to end-stage renal disease. IMA: Patent Inflow: Patent.  Stable left-sided dissection without occlusion. Veins: No significant findings. Review of the MIP images confirms the above findings. NON-VASCULAR Hepatobiliary: No hepatic lesions or intrahepatic biliary dilatation. Contracted gallbladder with probable gallstones. Pancreas: No inflammation. Spleen: Normal size. Adrenals/Urinary Tract: Stable mild enlargement of both adrenal glands likely hyperplasia. Is are enhancement pattern of both kidneys along with mild right-sided hydronephrosis likely due to end-stage renal disease. The bladder is unremarkable. Stomach/Bowel: Dilated small bowel loops with air-fluid levels with a transition to decompressed/normal distal ileal loops of small bowel suggesting small bowel obstruction likely related to adhesions. Mild distention of the ascending colon but the rest of the colon is largely decompressed and contains some contrast. Lymphatic: Stable borderline enlarged mesenteric and retroperitoneal lymph nodes and stable right inguinal lymphadenopathy. Reproductive: The uterus and ovaries are unremarkable. Other: Small amount of free abdominal and free pelvic fluid. Musculoskeletal: No significant bony findings. Review of the MIP images confirms the above findings. IMPRESSION: 1. Interval placement of an aortic stent graft without worrisome complicating features. 2. Stable abdominal aortic  dissection extending into the left common iliac, left external iliac and left common femoral arteries. 3. Aortic branch vessels are patent. 4. Small-bowel obstruction with transition noted in the distal ileum likely due to adhesions. 5. Small bilateral pleural effusions and bibasilar atelectasis. 6. Stable borderline enlarged mesenteric and retroperitoneal lymph nodes and stable right inguinal lymphadenopathy. Electronically Signed   By: Rudie Meyer M.D.   On: 02/07/2023 13:00   DG Chest Port 1 View  Result Date: 02/07/2023 CLINICAL DATA:  SOB (shortness of breath) EXAM: PORTABLE CHEST 1 VIEW COMPARISON:  Chest x-ray 10/13/2020. FINDINGS: Low lung volumes with bibasilar opacities and probable small right pleural effusion. No visible pneumothorax. Aortic stent. Mildly enlarged cardiac silhouette, accentuated by technique. Right IJ central venous catheter with the tip projecting at the lower right atrium. Enteric and gastric tube courses below the diaphragm outside the field of view. IMPRESSION: Low lung volumes with bibasilar opacities and probable small right pleural effusion. Electronically Signed   By: Feliberto Harts M.D.   On: 02/07/2023 10:32   DG Abd 1 View  Result Date: 02/07/2023 CLINICAL DATA:  Shortness of breath. EXAM: ABDOMEN - 1 VIEW COMPARISON:  02/06/2023 FINDINGS: Persistent bibasilar collapse/consolidation, right greater than left with right-sided pleural effusion. Diffuse gaseous distension of small bowel again noted mildly progressive in the interval. Colonic distension seen on previous studies has decreased in the interval. NG tube tip is folded in the distal stomach with the tip in the  antral region. Feeding tube tip is transpyloric left the level of the ligament of Treitz. IMPRESSION: 1. Persistent bibasilar collapse/consolidation, right greater than left with right-sided pleural effusion. 2. Diffuse gaseous distension of small bowel mildly progressive in the interval. Colonic  distension has decreased in the interval. Electronically Signed   By: Kennith Center M.D.   On: 02/07/2023 10:23   DG Abd Portable 1V  Result Date: 02/06/2023 CLINICAL DATA:  Evaluate feeding tube placement. EXAM: PORTABLE ABDOMEN - 1 VIEW COMPARISON:  Earlier today FINDINGS: There is a feeding tube identified within the upper abdomen which follows the normal course of the stomach and duodenal C loop with tip in the expected location of the ligament of Treitz. A second nasogastric scratch set a nasogastric tube is also noted with tip projecting over the expected location of the body of stomach. Thoracic aortic stent graft is again seen. Air-filled loops of small and large bowel appears similar to the previous exam. IMPRESSION: 1. Feeding tube tip projects over the expected location of the ligament of Treitz. 2. Nasogastric tube tip projects over the body of the stomach. Electronically Signed   By: Signa Kell M.D.   On: 02/06/2023 14:00   DG Abd 1 View  Result Date: 02/06/2023 CLINICAL DATA:  Nasogastric tube. EXAM: ABDOMEN - 1 VIEW COMPARISON:  Abdominal radiograph 02/05/2023 FINDINGS: Interval placement of nasogastric tube which is curled in the gastric fundus with the tip directed in the caudal direction. Enteric tube with weighted tip projects at the midline abdomen, overlying the expected location of the gastric antrum. Air-filled distended loops of small bowel and colon in the visualized portions of the left and mid abdomen. Aortic stent graft. Partially visualized superior approach central venous catheter with the tip projecting just below the level of the superior cavoatrial junction. IMPRESSION: Interval placement of a nasogastric tube which is curled in the gastric fundus, with the tip directed in the caudal direction. Electronically Signed   By: Sherron Ales M.D.   On: 02/06/2023 12:28    Anti-infectives: Anti-infectives (From admission, onward)    Start     Dose/Rate Route Frequency  Ordered Stop   02/02/23 1915  ceFAZolin (ANCEF) IVPB 2g/100 mL premix        2 g 200 mL/hr over 30 Minutes Intravenous Every 8 hours 02/02/23 1820 02/03/23 0337   02/02/23 1452  ceFAZolin (ANCEF) 2-4 GM/100ML-% IVPB       Note to Pharmacy: Payton Emerald A: cabinet override      02/02/23 1452 02/03/23 0259   02/02/23 1450  ceFAZolin (ANCEF) 3-0.9 GM/100ML-% IVPB  Status:  Discontinued       Note to Pharmacy: Payton Emerald A: cabinet override      02/02/23 1450 02/02/23 1453   02/02/23 1000  dolutegravir ([name]) tablet 50 mg        50 mg Per Tube Daily 02/01/23 2052     02/02/23 1000  emtricitabine-tenofovir AF (DESCOVY) 200-25 MG per tablet 1 tablet        1 tablet Per Tube Daily 02/01/23 2052     01/30/23 1531  ceFAZolin (ANCEF) IVPB 2g/100 mL premix        over 30 Minutes Intravenous Continuous PRN 01/30/23 1537 01/30/23 1531   01/29/23 1115  dolutegravir ([name]) tablet 50 mg  Status:  Discontinued        50 mg Oral Daily 01/29/23 1021 02/01/23 2052   01/29/23 1115  emtricitabine-tenofovir AF (DESCOVY) 200-25 MG per tablet 1 tablet  Status:  Discontinued        1 tablet Oral Daily 01/29/23 1021 02/01/23 2052        Assessment/Plan  SBO vs ileus 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 Karin Lieu - CTA 4/23 shows interval placement of an aortic stent graft without worrisome complicating features, as well as dilated loops of small bowel with transition noted in the distal ileum concerning for SBO.  - WBC is 7.1 from 11.6 and lactic acid WNL.  - NG - 5,025 ml/24h , NG tube may be post-pyloric which would increase NG output - I have asked RN to pull back NG tube 8 cm.  KUB with mild small bowel dilation along with some air in the colon -- No indication for acute surgical intervention.  - Given history seems more likely to be an ileus. If she has a distal SBO then it is a partial SBO because contrast from CT made it to her colon.   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  This care required moderate level of medical decision making.   Hosie Spangle, PA-C [facility] Please see Amion for pager number during day hours 7:00am-4:30pm

## 2023-02-08 NOTE — Progress Notes (Addendum)
  Progress Note    02/08/2023 8:15 AM 6 Days Post-Op  Subjective:  abd feeling much better this morning   Vitals:   02/08/23 0700 02/08/23 0722  BP: (!) 103/51 (!) 106/46  Pulse: 65 61  Resp: 15 15  Temp:  98.1 F (36.7 C)  SpO2: 94% 92%   Physical Exam: Lungs:  non labored Incisions:  R groin well healed Extremities:  palpable DP pulses Abdomen:  soft, less distended Neurologic: A&O  CBC    Component Value Date/Time   WBC 7.1 02/08/2023 0043   RBC 2.58 (L) 02/08/2023 0043   HGB 8.4 (L) 02/08/2023 0043   HGB 11.7 (L) 07/27/2021 1110   HCT 24.3 (L) 02/08/2023 0043   PLT 188 02/08/2023 0043   PLT 228 07/27/2021 1110   MCV 94.2 02/08/2023 0043   MCH 32.6 02/08/2023 0043   MCHC 34.6 02/08/2023 0043   RDW 16.1 (H) 02/08/2023 0043   LYMPHSABS 1.6 12/19/2022 1242   MONOABS 0.7 12/19/2022 1242   EOSABS 0.2 12/19/2022 1242   BASOSABS 0.0 12/19/2022 1242    BMET    Component Value Date/Time   NA 127 (L) 02/08/2023 0043   NA 142 12/05/2022 0000   K 4.4 02/08/2023 0043   CL 88 (L) 02/08/2023 0043   CO2 22 02/08/2023 0043   GLUCOSE 85 02/08/2023 0043   BUN 72 (H) 02/08/2023 0043   BUN 44 (A) 12/05/2022 0000   CREATININE 9.05 (H) 02/08/2023 0043   CREATININE 2.72 (H) 07/27/2021 1110   CREATININE 2.74 (H) 03/17/2021 1050   CALCIUM 8.7 (L) 02/08/2023 0043   GFRNONAA 5 (L) 02/08/2023 0043   GFRNONAA 20 (L) 07/27/2021 1110   GFRAA 39 (L) 07/14/2020 1053   GFRAA 33 (L) 08/12/2019 1447    INR    Component Value Date/Time   INR 1.0 07/14/2019 0312     Intake/Output Summary (Last 24 hours) at 02/08/2023 0815 Last data filed at 02/08/2023 2130 Gross per 24 hour  Intake 1465.33 ml  Output 4725 ml  Net -3259.67 ml     Assessment/Plan:  57 y.o. female is s/p TEVAR with subsequent ileus 6 Days Post-Op   BLE well perfused with motor and sensation intact Repeat CTA shows successful repair of dissection and patent mesenteric vessels Surgery consulted for ileus;  abd much less distended this morning AVF vs AVG when more stable   Emilie Rutter, PA-C [facility] [phone] 02/08/2023 8:15 AM   VASCULAR STAFF ADDENDUM: I have independently interviewed and examined the patient. I agree with the above.  Abd pain improved. Will continue to follow. Fistula creation pending significant clinical improvement and floor status. Will try to perform prior to discharge.   Fara Olden, MD [facility] Phone Number: [phone] 02/08/2023 1:08 PM

## 2023-02-08 NOTE — Progress Notes (Signed)
Physical Therapy Treatment Patient Details Name: [name] MRN: [id number] DOB: 12-13-1965 Today's Date: 02/08/2023   History of Present Illness 57 y.o. 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 S/p 4/18 thoracic endovascular aortic repair. PMH:t2dm, ckd4, hiv, sarcoidosis    PT Comments    Pt finished with HD and just washed up, ready to work. Pt with smile on face and reports overall feeling a lot better. Pt is min A for bed mobility, transfers and minAx2 for ambulation in hallway with close chair follow. Pt reports feeling better after ambulation and although she got back to bed for procedure agreeable to getting up to recliner this afternoon. Brother and sister in room and very encouraged to see patient able to walk in hall. D/c plan remains appropriate at this time. PT will continue to follow acutely.    Recommendations for follow up therapy are one component of a multi-disciplinary discharge planning process, led by the attending physician.  Recommendations may be updated based on patient status, additional functional criteria and insurance authorization.     Assistance Recommended at Discharge Frequent or constant Supervision/Assistance  Patient can return home with the following A little help with walking and/or transfers;A little help with bathing/dressing/bathroom;Assistance with cooking/housework;Assist for transportation;Help with stairs or ramp for entrance   Equipment Recommendations  Rolling walker (2 wheels);BSC/3in1    Recommendations for Other Services OT consult     Precautions / Restrictions Precautions Precaution Comments: Cortrak, NGT, watch Sp)2 Restrictions Weight Bearing Restrictions: No     Mobility  Bed Mobility Overal bed mobility: Needs Assistance Bed Mobility: Supine to Sit, Sit to Supine      Supine to sit: HOB elevated, Min assist     General bed mobility comments: vc for sequencing and min A for scooting hips to EoB, min A for management of R LE back into bed    Transfers Overall transfer level: Needs assistance Equipment used: Rolling walker (2 wheels) Transfers: Sit to/from Stand, Bed to chair/wheelchair/BSC Sit to Stand: Min assist, From elevated surface, +2 safety/equipment           General transfer comment: minAx2 for power up to [name] walker from elevated bed and lower recliner    Ambulation/Gait Ambulation/Gait assistance: Min assist Gait Distance (Feet): 120 Feet (+10) Assistive device:  ([name] walker) Gait Pattern/deviations: Step-through pattern, Decreased step length - right, Decreased step length - left, Drifts right/left Gait velocity: variable Gait velocity interpretation: >2.62 ft/sec, indicative of community ambulatory   General Gait Details: minA for steadying with [name] walker however pt able to ambulate with strong gait and increasing velocity, until pt begins to fatigue, had pt walk from door to bed to illustrate her ability to perform two bouts of walking with seated rest break       Balance Overall balance assessment: Needs assistance Sitting-balance support: Feet supported, Bilateral upper extremity supported Sitting balance-Leahy Scale: Fair     Standing balance support: Bilateral upper extremity supported, During functional activity, Reliant on assistive device for balance Standing balance-Leahy Scale: Poor                              Cognition Arousal/Alertness: Awake/alert Behavior During Therapy: WFL for tasks assessed/performed Overall Cognitive Status: Within Functional Limits for tasks assessed  General Comments General comments (skin integrity, edema, etc.): VSS on 2L O2, via [place], pt reports feeling nauseous on coming to sitting, requesting nausea  medication to reduce risk of wretching and irritating already very sore throat      Pertinent Vitals/Pain Pain Assessment Pain Assessment: Faces Faces Pain Scale: Hurts little more Pain Location: throat with thrush Pain Descriptors / Indicators: Sharp, Sore, Nagging Pain Intervention(s): Limited activity within patient's tolerance, Monitored during session, Repositioned     PT Goals (current goals can now be found in the care plan section) Acute Rehab PT Goals Patient Stated Goal: go home PT Goal Formulation: With patient Time For Goal Achievement: 02/18/23 Potential to Achieve Goals: Good Progress towards PT goals: Progressing toward goals    Frequency    Min 1X/week      PT Plan Current plan remains appropriate       AM-PAC PT "6 Clicks" Mobility   Outcome Measure  Help needed turning from your back to your side while in a flat bed without using bedrails?: A Little Help needed moving from lying on your back to sitting on the side of a flat bed without using bedrails?: A Little Help needed moving to and from a bed to a chair (including a wheelchair)?: A Lot Help needed standing up from a chair using your arms (e.g., wheelchair or bedside chair)?: A Lot Help needed to walk in hospital room?: A Lot Help needed climbing 3-5 steps with a railing? : Total 6 Click Score: 13    End of Session Equipment Utilized During Treatment: Gait belt Activity Tolerance: Patient tolerated treatment well Patient left: with call bell/phone within reach;in bed;with nursing/sitter in room (returned to bed for line placement) Nurse Communication: Mobility status PT Visit Diagnosis: Difficulty in walking, not elsewhere classified (R26.2);Muscle weakness (generalized) (M62.81);Pain Pain - part of body:  (back)     Time: 4098-1191 PT Time Calculation (min) (ACUTE ONLY): 39 min  Charges:  $Gait Training: 23-37 mins $Therapeutic Activity: 8-22 mins                     Fernando Stoiber B. Beverely Risen PT, DPT Acute Rehabilitation Services Please use secure chat or  Call Office [phone]    [name] [facility] 02/08/2023, 1:48 PM

## 2023-02-08 NOTE — Progress Notes (Signed)
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  Started HD due to uremic symptoms. S/p [facility] 4/15. IHD #1 4/16 - HD per MWF schedule for now  - CLIP underway for outpatient HD unit placement - She states that she is no longer interested in PD.  Would like to do in-center HD.  Will need AVF/AVG and vein mapping and access planning once stabilized.  Looks quite a bit better today but still in ICU -Follow daily labs, strict I/O  # Type B dissection -VVS following. S/p TEVAR 4/18 -BP control as below  # Encephalopathy -work up per primary service.  Per charting mentation has improved which may correlate with HD  # HTN - goals per vascular, CCM.  She was on levo for a short time but is now off  - optimize volume as tolerated with HD - I have discontinued amlodipine and hydralazine as they have been held.  Her losartan and coreg have been intermittently held.  I have discontinued losartan to prioritize re-addition of her beta blocker.  I reduced coreg to 12.5 mg BID for now  # HIV - therapy per primary team   # DM2 -per primary service  # Urinary retention   -UA re-ordered - not yet obtained   # Hyponatremia - optimize with HD/UF  # Small bowel obstruction - per primary team   # Normocytic Anemia  - transfusions per primary team  - iron deficiency - will give ferrlecit x 2 doses  - Will give ESA - aranesp 40 mcg once for 4/24  # Hyperphosphatemia  - Improving with HD  - start binder once taking PO - she has an NG tube and a small bowel obstruction.    Disposition - in the ICU.  Continue inpatient monitoring    Subjective:   She is still in the ICU.  Last HD on 4/22 with 3.5 kg UF.  She has been off of levo.  Amlodipine and hydralazine have been held and losartan and coreg have been intermittently held.  She had no urine output over 4/23 charted.  She had 4.6 liters out via  NG.  She had a CT angio on 4/23 which demonstrated small bowel obstruction felt likely due to adhesions and a stable abdominal aortic dissection with aortic stent graft in place.  Surgery evaluated the patient and recommended continuing with NG decompression.  She feels much, much better today.  She states that her family has encouraged her to do HD in-center rather than PD at home.  She has a dog that sleeps in the room with her.   Review of systems:   shortness of breath is better Abdominal pain is much better - she rates as a 4/5 out of 10.  NG output as above.   She urinated 400 mL not overnight but the night prior per nursing/pt.    Objective:   BP (!) 108/50 (BP Location: Right Arm)   Pulse 63   Temp 98.5 F (36.9 C) (Oral)   Resp 16   Ht  (1.651 m)   Wt 92.9 kg   LMP 03/31/2014 (LMP Unknown)   SpO2 94%   BMI 34.08 kg/m   Intake/Output Summary (Last 24 hours) at 02/08/2023 0610 Last data filed at 02/08/2023 0329 Gross per 24 hour  Intake 1544.2 ml  Output 4625 ml  Net -3080.8 ml   Weight change: -6.2  kg  Physical Exam:      General adult female in bed in no acute distress HEENT normocephalic atraumatic extraocular movements intact sclera anicteric Neck supple trachea midline Lungs clear to auscultation bilaterally anteriorly unlabored at rest; on 2 liters oxygen Heart S1S2 no rub Abdomen soft not tender to palpation for me; less distended. obese habitus Extremities no edema lower extremities  Psych no anxiety or agitation Neuro patient provides some history and follows commands Access: RIJ tunn catheter in place    Imaging: CT Angio Chest/Abd/Pel for Dissection W and/or W/WO  Result Date: 02/07/2023 CLINICAL DATA:  Abdominal pain. Recent stent graft or aortic dissection. EXAM: CT ANGIOGRAPHY CHEST, ABDOMEN AND PELVIS TECHNIQUE: Non-contrast CT of the chest was initially obtained. Multidetector CT imaging through the chest, abdomen and pelvis was performed using  the standard protocol during bolus administration of intravenous contrast. Multiplanar reconstructed images and MIPs were obtained and reviewed to evaluate the vascular anatomy. RADIATION DOSE REDUCTION: This exam was performed according to the departmental dose-optimization program which includes automated exposure control, adjustment of the mA and/or kV according to patient size and/or use of iterative reconstruction technique. CONTRAST:  OMNIPAQUE IOHEXOL 350 MG/ML SOLN COMPARISON:  Multiple previous studies. FINDINGS: CTA CHEST FINDINGS Cardiovascular: The heart is borderline enlarged but stable. No pericardial effusion. Interval placement of an aortic stent graft. No worrisome complicating features are identified. The arch vessels are patent. There is some contrast flowing into the false lumen and extending down into the abdominal aorta. Mediastinum/Nodes: No mediastinal or hilar mass or adenopathy. NG tube and feeding tube are coursing down the esophagus and into the stomach. Lungs/Pleura: Small bilateral pleural effusions and bibasilar atelectasis. No pulmonary edema or pneumothorax. Musculoskeletal: No significant bony findings. Review of the MIP images confirms the above findings. CTA ABDOMEN AND PELVIS FINDINGS VASCULAR Aorta: Upper abdominal aorta stent graft. This ends just above the celiac axis. Stable aortic dissection extending into the left common iliac, left external iliac artery and left common femoral artery. No occlusion. Celiac: Patent SMA: Patent Renals: Stable marked irregularity involving the proximal left renal artery but no occlusion. Is are enhancement pattern of both kidneys likely related to end-stage renal disease. IMA: Patent Inflow: Patent.  Stable left-sided dissection without occlusion. Veins: No significant findings. Review of the MIP images confirms the above findings. NON-VASCULAR Hepatobiliary: No hepatic lesions or intrahepatic biliary dilatation. Contracted gallbladder  with probable gallstones. Pancreas: No inflammation. Spleen: Normal size. Adrenals/Urinary Tract: Stable mild enlargement of both adrenal glands likely hyperplasia. Is are enhancement pattern of both kidneys along with mild right-sided hydronephrosis likely due to end-stage renal disease. The bladder is unremarkable. Stomach/Bowel: Dilated small bowel loops with air-fluid levels with a transition to decompressed/normal distal ileal loops of small bowel suggesting small bowel obstruction likely related to adhesions. Mild distention of the ascending colon but the rest of the colon is largely decompressed and contains some contrast. Lymphatic: Stable borderline enlarged mesenteric and retroperitoneal lymph nodes and stable right inguinal lymphadenopathy. Reproductive: The uterus and ovaries are unremarkable. Other: Small amount of free abdominal and free pelvic fluid. Musculoskeletal: No significant bony findings. Review of the MIP images confirms the above findings. IMPRESSION: 1. Interval placement of an aortic stent graft without worrisome complicating features. 2. 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 3. Aortic branch vessels are patent. 4. Small-bowel obstruction with transition noted in the distal ileum likely due to adhesions. 5. Small bilateral  pleural effusions and bibasilar atelectasis. 6. Stable borderline enlarged mesenteric and retroperitoneal lymph nodes and stable right inguinal lymphadenopathy. Electronically Signed   By: Rudie Meyer M.D.   On: 02/07/2023 13:00   DG Chest Port 1 View  Result Date: 02/07/2023 CLINICAL DATA:  SOB (shortness of breath) EXAM: PORTABLE CHEST 1 VIEW COMPARISON:  Chest x-ray 10/13/2020. FINDINGS: Low lung volumes with bibasilar opacities and probable small right pleural effusion. No visible pneumothorax. Aortic stent. Mildly enlarged cardiac silhouette, accentuated by technique.  Right IJ central venous catheter with the tip projecting at the lower right atrium. Enteric and gastric tube courses below the diaphragm outside the field of view. IMPRESSION: Low lung volumes with bibasilar opacities and probable small right pleural effusion. Electronically Signed   By: Feliberto Harts M.D.   On: 02/07/2023 10:32   DG Abd 1 View  Result Date: 02/07/2023 CLINICAL DATA:  Shortness of breath. EXAM: ABDOMEN - 1 VIEW COMPARISON:  02/06/2023 FINDINGS: Persistent bibasilar collapse/consolidation, right greater than left with right-sided pleural effusion. Diffuse gaseous distension of small bowel again noted mildly progressive in the interval. Colonic distension seen on previous studies has decreased in the interval. NG tube tip is folded in the distal stomach with the tip in the antral region. Feeding tube tip is transpyloric left the level of the ligament of Treitz. IMPRESSION: 1. Persistent bibasilar collapse/consolidation, right greater than left with right-sided pleural effusion. 2. Diffuse gaseous distension of small bowel mildly progressive in the interval. Colonic distension has decreased in the interval. Electronically Signed   By: Kennith Center M.D.   On: 02/07/2023 10:23   DG Abd Portable 1V  Result Date: 02/06/2023 CLINICAL DATA:  Evaluate feeding tube placement. EXAM: PORTABLE ABDOMEN - 1 VIEW COMPARISON:  Earlier today FINDINGS: There is a feeding tube identified within the upper abdomen which follows the normal course of the stomach and duodenal C loop with tip in the expected location of the ligament of Treitz. A second nasogastric scratch set a nasogastric tube is also noted with tip projecting over the expected location of the body of stomach. Thoracic aortic stent graft is again seen. Air-filled loops of small and large bowel appears similar to the previous exam. IMPRESSION: 1. Feeding tube tip projects over the expected location of the ligament of Treitz. 2. Nasogastric tube  tip projects over the body of the stomach. Electronically Signed   By: Signa Kell M.D.   On: 02/06/2023 14:00   DG Abd 1 View  Result Date: 02/06/2023 CLINICAL DATA:  Nasogastric tube. EXAM: ABDOMEN - 1 VIEW COMPARISON:  Abdominal radiograph 02/05/2023 FINDINGS: Interval placement of nasogastric tube which is curled in the gastric fundus with the tip directed in the caudal direction. Enteric tube with weighted tip projects at the midline abdomen, overlying the expected location of the gastric antrum. Air-filled distended loops of small bowel and colon in the visualized portions of the left and mid abdomen. Aortic stent graft. Partially visualized superior approach central venous catheter with the tip projecting just below the level of the superior cavoatrial junction. IMPRESSION: Interval placement of a nasogastric tube which is curled in the gastric fundus, with the tip directed in the caudal direction. Electronically Signed   By: Sherron Ales M.D.   On: 02/06/2023 12:28    Labs: BMET Recent Labs  Lab 02/02/23 0854 02/03/23 0419 02/04/23 0309 02/05/23 0128 02/05/23 0129 02/06/23 0042 02/07/23 0034 02/08/23 0043  NA 128* 129* 129*  --  131* 128* 130* 127*  K 4.0 4.1 3.6  --  3.6 4.3 3.8 4.4  CL 91* 93* 94*  --  95* 93* 93* 88*  CO2 21* 20* 24  --  23 19* 25 22  GLUCOSE 168* 145* 169*  --  159* 113* 174* 85  BUN 45* 53* 39*  --  55* 71* 50* 72*  CREATININE 7.71* 8.45* 6.68*  --  7.83* 9.08* 6.71* 9.05*  CALCIUM 7.5* 7.2* 7.4*  --  8.0* 8.2* 8.4* 8.7*  PHOS  --  9.9* 6.0* 7.0* 7.0* 8.8* 6.3* 8.4*   CBC Recent Labs  Lab 02/05/23 0128 02/06/23 0042 02/07/23 0034 02/08/23 0043  WBC 19.8* 16.7* 11.6* 7.1  HGB 7.9* 8.3* 8.3* 8.4*  HCT 24.0* 24.7* 23.8* 24.3*  MCV 97.2 96.5 94.4 94.2  PLT 196 238 223 188    Medications:     amLODipine  10 mg Per Tube Daily   aspirin  81 mg Per Tube Daily   bisacodyl  10 mg Rectal Q0600   carvedilol  25 mg Per Tube BID   Chlorhexidine  Gluconate Cloth  6 each Topical Daily   Chlorhexidine Gluconate Cloth  6 each Topical Q0600   Chlorhexidine Gluconate Cloth  6 each Topical Q0600   dolutegravir  50 mg Per Tube Daily   emtricitabine-tenofovir AF  1 tablet Per Tube Daily   famotidine  20 mg Per Tube Daily   heparin  5,000 Units Subcutaneous Q8H   hydrALAZINE  100 mg Per Tube Q8H   insulin aspart  0-15 Units Subcutaneous Q4H   levothyroxine  175 mcg Per Tube Q0600   lidocaine  1 patch Transdermal Q24H   losartan  100 mg Per Tube Daily   metoCLOPramide (REGLAN) injection  5 mg Intravenous Q12H   multivitamin  1 tablet Per Tube QHS   mouth rinse  15 mL Mouth Rinse 4 times per day   rosuvastatin  10 mg Per Tube Daily   sertraline  50 mg Per Tube Daily     Estanislado Emms, MD 6:43 AM 02/08/2023

## 2023-02-08 NOTE — Progress Notes (Signed)
NAME:  [name], MRN:  [id number], DOB:  [birth year], LOS: 10 ADMISSION DATE:  01/29/2023, CONSULTATION DATE:  01/29/23 REFERRING MD:  EDP, CHIEF COMPLAINT:  aortic dissection   History of Present Illness:  57 yo female with pmh t2dm, ckd4, hiv, sarcoidosis who prsented to [facility] with complaints of back and abdominal pain with nausea and non bloody vomiting. Her family at bedside endorse this has been ongoing for 2 days not. Family provides most history as pt is quite drowsy after pain medication. The state that pt had no other associated symptoms. She had been trying to treat herself for consitpation thinking that was the cause of her pain. No fevers/ chills. No diarrhea. No recent change in medications.   She presented with elevated BP. Systolics >200. She was started on esmolol and nitro gtts and vascular is coming to eval.   Pertinent  Medical History  T2dm Htn Hyperlipidemia Hiv Hypothyroidism ckd4  Significant Hospital Events: Including procedures, antibiotic start and stop dates in addition to other pertinent events   Admitted to hospital 4/14, on cardene and esmolol 4/15 TDC placement, uremic encephalopathy 4/18 CT angio c/a/b, AO bifem. TEVAR  4/19 AKI on CKD requiring RRT   Interim History / Subjective:  Feels better this morning, would like to eat but no bowel movements and NG tube in place. Discussed placing CVC for enteral feedings for which she is agreeable. Denies any numbness tingling in extremities  Objective   Blood pressure (!) 108/47, pulse 65, temperature 98 F (36.7 C), temperature source Oral, resp. rate 13, height  (1.651 m), weight 92.9 kg, last menstrual period 03/31/2014, SpO2 93 %.        Intake/Output Summary (Last 24 hours) at 02/08/2023 0947 Last data filed at 02/08/2023 4010 Gross per 24 hour  Intake 912.03 ml  Output 4725 ml  Net -3812.97 ml    Filed Weights   02/07/23 0500 02/08/23 0500 02/08/23 2725  Weight: 99.1 kg 92.9  kg 92.9 kg    Constitutional: no acute distress HENT: normocephalic atraumatic Eyes: conjunctiva non-erythematous Neck: supple Cardiovascular: regular rate, 2/6 systolic murmur Pulmonary/Chest: normal work of breathing on 2L [place] Abdominal: soft, distended MSK: SCD in place, extremities warm Neurological: alert & oriented x 3 Skin: warm and dry Psych: alert and oriented x 3  Resolved Hospital Problem list   Acute encephalopathy  Assessment & Plan:   Type B dissection s/p TEVAR (4/18) -- proximal descending thoracic aorta distal to origin of L subclav to iliacs, extending LLE to bifurcation of superficial and profunda fem arteries  Hx HTN Ongoing abd pain, dilated bowel loops- persistent dilation on imaging today concerning for ileus.  Currently, low Bps+ LE parasthesia- improved -continue levophed, map goal 70-80 -lactate normal yesterday -CTA 04/24 with stable dissection -continue neurovacular checks -balance opioid administration and ileus  High suspicion OSA-  outpatient sleep referral  AKI on CKD 5 -- now ESRD  Hyponatremia  Hyperphosphatemia AGMA iHD per nephrology, appreciate their assistance. Phos improving with dialysis sessions, K stable.   Urinary retention Will need to follow up UA. Some UA on bladder scan, can repeat today  Normocytic anemia Likely in setting of chronic disease.  -ESA per nephrology -dissection stable  DM2   -SSI   HIV;  well controlled recently with undetectable VL and CD4 685 in 12/2022 -cont tivicay, descovy. Try to avoid missed doses to prevent resistance  Hypothyroidism- last TSH 2.6 08/23/22 -synthroid   GERD -PPI  Best Practice (right click  and "Reselect all SmartList Selections" daily)   Diet/type: NPO, plan for CVC placement today and enteral feeds DVT prophylaxis: heparin GI prophylaxis: PPI Lines: Dialysis Catheter Foley:  N/A Code Status:  full code Last date of multidisciplinary goals of care discussion  [patient updated 4/23]  Thalia Bloodgood DO  Internal Medicine Resident PGY-3 [place]  Pager: [phone]

## 2023-02-08 NOTE — Progress Notes (Signed)
PT Cancellation Note  Patient Details Name: [name] MRN: [id number] DOB: 1965/10/27   Cancelled Treatment:    Reason Eval/Treat Not Completed: (P) Patient at procedure or test/unavailable Pt receiving HD. PT will follow back in ~2 hours when finished.  [name] B. Beverely Risen PT, DPT Acute Rehabilitation Services Please use secure chat or  Call Office [phone]    [name] Fleet 02/08/2023, 10:02 AM

## 2023-02-08 NOTE — Procedures (Addendum)
Central Venous Catheter Insertion Procedure Note  [name]  [id number]  [birth year]  Date:02/08/23  Time:1:59 PM   Provider Performing:Vasili Nylah Butkus   Procedure: Insertion of Non-tunneled Central Venous [phone]) with US guidance ([id number])   Indication(s) Medication administration  Consent Risks of the procedure as well as the alternatives and risks of each were explained to the patient and/or caregiver.  Consent for the procedure was obtained and is signed in the bedside chart  Anesthesia Topical only with 1% lidocaine   Timeout Verified patient identification, verified procedure, site/side was marked, verified correct patient position, special equipment/implants available, medications/allergies/relevant history reviewed, required imaging and test results available.  Sterile Technique Maximal sterile technique including full sterile barrier drape, hand hygiene, sterile gown, sterile gloves, mask, hair covering, sterile ultrasound probe cover (if used).  Procedure Description Area of catheter insertion was cleaned with chlorhexidine and draped in sterile fashion.  With real-time ultrasound guidance a central venous catheter was placed into the left internal jugular vein. Nonpulsatile blood flow and easy flushing noted in all ports.  The catheter was sutured in place and sterile dressing applied.  Complications/Tolerance None; patient tolerated the procedure well. Chest X-ray is ordered to verify placement for internal jugular or subclavian cannulation.   Chest x-ray is not ordered for femoral cannulation.  EBL Minimal  Specimen(s) None

## 2023-02-08 NOTE — Progress Notes (Addendum)
PHARMACY - TOTAL PARENTERAL NUTRITION CONSULT NOTE   Indication: Prolonged ileus   Patient Measurements: Height:  (165.1 cm) Weight: 92.9 kg (204 lb 12.9 oz) IBW/kg (Calculated) : 57 TPN AdjBW (KG): 66.8 Body mass index is 34.08 kg/m. Usual Weight: ~99 kg  Assessment:  57 yo F initially presented with type B aortic dissection now s/p TEVAR (4/18). Pt now with ongoing abdominal pain and dilated bowel loops on imaging c/f ileus. Trickle tube feeding attempted 4/22. Pt with ~5L of NG output 4/23.   Glucose / Insulin: CBG <180 - received 7u SSI in last 24h  Electrolytes: Na 127, K 4.4, Cl 88, CO2 22  Renal: Hemodialysis (MWF schedule) - last 4/24; Phos 8.4 Hepatic: LFTs WNL, Alb 2.2  Intake / Output; MIVF: UO 0.41ml/kg/hr ; LBM 4/23. D10 /hr GI Imaging:  4/24: KUB: ileus vs SBO GI Surgeries / Procedures: n/a  Central access: to be placed 4/24 in afternoon  TPN start date: 4/24 PM   Nutritional Goals: Goal TPN rate is 70 mL/hr (provides 80 g of protein and 1800 kcals per day)  RD Assessment: Estimated Needs Total Energy Estimated Needs: 1750-1950 kcals Total Protein Estimated Needs: 75-85 g Total Fluid Estimated Needs: 1L plus UOP  Current Nutrition:  NPO sips w/ meds  Plan:  Start TPN at 30 mL/hr at 1800 - will provide ~40% of nutritional goals Electrolytes in TPN: Na 50 mEq/L, K 22mEq/L, Ca 62mEq/L, Mg 71mEq/L, and Phos 68mmol/L. Cl:Ac 1:1 Add standard MVI and trace elements, remove chromium in TPN Discontinue enteral multivitamin Continue Moderate q4h SSI and adjust as needed  Monitor TPN labs on Mon/Thurs, daily until stable  Calton Dach, PharmD Clinical Pharmacist 02/08/2023 11:55 AM

## 2023-02-09 ENCOUNTER — Inpatient Hospital Stay (HOSPITAL_COMMUNITY): Payer: Medicare Other

## 2023-02-09 DIAGNOSIS — N186 End stage renal disease: Secondary | ICD-10-CM

## 2023-02-09 DIAGNOSIS — I7103 Dissection of thoracoabdominal aorta: Secondary | ICD-10-CM | POA: Diagnosis not present

## 2023-02-09 LAB — COMPREHENSIVE METABOLIC PANEL
ALT: 8 U/L (ref 0–44)
AST: 13 U/L — ABNORMAL LOW (ref 15–41)
Albumin: 2.3 g/dL — ABNORMAL LOW (ref 3.5–5.0)
Alkaline Phosphatase: 62 U/L (ref 38–126)
Anion gap: 12 (ref 5–15)
BUN: 47 mg/dL — ABNORMAL HIGH (ref 6–20)
CO2: 28 mmol/L (ref 22–32)
Calcium: 8.2 mg/dL — ABNORMAL LOW (ref 8.9–10.3)
Chloride: 92 mmol/L — ABNORMAL LOW (ref 98–111)
Creatinine, Ser: 7.18 mg/dL — ABNORMAL HIGH (ref 0.44–1.00)
GFR, Estimated: 6 mL/min — ABNORMAL LOW (ref >60)
Glucose, Bld: 139 mg/dL — ABNORMAL HIGH (ref 70–99)
Potassium: 3.2 mmol/L — ABNORMAL LOW (ref 3.5–5.1)
Sodium: 132 mmol/L — ABNORMAL LOW (ref 135–145)
Total Bilirubin: 0.7 mg/dL (ref 0.3–1.2)
Total Protein: 6.5 g/dL (ref 6.5–8.1)

## 2023-02-09 LAB — CBC
HCT: 21.4 % — ABNORMAL LOW (ref 36.0–46.0)
HCT: 22.4 % — ABNORMAL LOW (ref 36.0–46.0)
Hemoglobin: 7.1 g/dL — ABNORMAL LOW (ref 12.0–15.0)
Hemoglobin: 7.4 g/dL — ABNORMAL LOW (ref 12.0–15.0)
MCH: 32.4 pg (ref 26.0–34.0)
MCH: 32.6 pg (ref 26.0–34.0)
MCHC: 33.2 g/dL (ref 30.0–36.0)
MCHC: 33 g/dL (ref 30.0–36.0)
MCV: 97.7 fL (ref 80.0–100.0)
MCV: 98.7 fL (ref 80.0–100.0)
Platelets: 166 10*3/uL (ref 150–400)
Platelets: 188 10*3/uL (ref 150–400)
RBC: 2.19 MIL/uL — ABNORMAL LOW (ref 3.87–5.11)
RBC: 2.27 MIL/uL — ABNORMAL LOW (ref 3.87–5.11)
RDW: 16.2 % — ABNORMAL HIGH (ref 11.5–15.5)
RDW: 16.2 % — ABNORMAL HIGH (ref 11.5–15.5)
WBC: 6.6 10*3/uL (ref 4.0–10.5)
WBC: 8.4 10*3/uL (ref 4.0–10.5)
nRBC: 0 % (ref 0.0–0.2)
nRBC: 0 % (ref 0.0–0.2)

## 2023-02-09 LAB — GLUCOSE, CAPILLARY
Glucose-Capillary: 121 mg/dL — ABNORMAL HIGH (ref 70–99)
Glucose-Capillary: 127 mg/dL — ABNORMAL HIGH (ref 70–99)
Glucose-Capillary: 135 mg/dL — ABNORMAL HIGH (ref 70–99)
Glucose-Capillary: 148 mg/dL — ABNORMAL HIGH (ref 70–99)
Glucose-Capillary: 188 mg/dL — ABNORMAL HIGH (ref 70–99)
Glucose-Capillary: 190 mg/dL — ABNORMAL HIGH (ref 70–99)

## 2023-02-09 LAB — TYPE AND SCREEN

## 2023-02-09 LAB — PHOSPHORUS: Phosphorus: 5.2 mg/dL — ABNORMAL HIGH (ref 2.5–4.6)

## 2023-02-09 LAB — TRIGLYCERIDES: Triglycerides: 147 mg/dL (ref <150)

## 2023-02-09 LAB — MAGNESIUM: Magnesium: 2 mg/dL (ref 1.7–2.4)

## 2023-02-09 MED ORDER — PANTOPRAZOLE SODIUM 40 MG IV SOLR
40.0000 mg | INTRAVENOUS | Status: DC
  Administered 2023-02-09 – 2023-02-12 (×4): 40 mg via INTRAVENOUS
  Filled 2023-02-09 (×4): qty 10

## 2023-02-09 MED ORDER — DARBEPOETIN ALFA 60 MCG/0.3ML IJ SOSY
60.0000 ug | PREFILLED_SYRINGE | INTRAMUSCULAR | Status: DC
  Administered 2023-02-15: 60 ug via SUBCUTANEOUS
  Filled 2023-02-09: qty 0.3

## 2023-02-09 MED ORDER — FAMOTIDINE IN NACL 20-0.9 MG/50ML-% IV SOLN
20.0000 mg | Freq: Two times a day (BID) | INTRAVENOUS | Status: DC
  Administered 2023-02-09: 20 mg via INTRAVENOUS
  Filled 2023-02-09: qty 50

## 2023-02-09 MED ORDER — TRAVASOL 10 % IV SOLN
INTRAVENOUS | Status: AC
  Filled 2023-02-09: qty 612

## 2023-02-09 MED ORDER — POTASSIUM CHLORIDE 10 MEQ/50ML IV SOLN
10.0000 meq | INTRAVENOUS | Status: AC
  Administered 2023-02-09 (×2): 10 meq via INTRAVENOUS
  Filled 2023-02-09 (×2): qty 50

## 2023-02-09 MED ORDER — CHLORHEXIDINE GLUCONATE CLOTH 2 % EX PADS
6.0000 | MEDICATED_PAD | Freq: Every day | CUTANEOUS | Status: DC
Start: 2023-02-10 — End: 2023-02-09

## 2023-02-09 NOTE — Progress Notes (Signed)
PHARMACY - TOTAL PARENTERAL NUTRITION CONSULT NOTE   Indication: Prolonged ileus   Patient Measurements: Height:  (165.1 cm) Weight: 94 kg (207 lb 3.7 oz) IBW/kg (Calculated) : 57 TPN AdjBW (KG): 66.8 Body mass index is 34.49 kg/m. Usual Weight: ~99 kg  Assessment:  57 yo F initially presented with type B aortic dissection now s/p TEVAR (4/18). Pt now with ongoing abdominal pain and dilated bowel loops on imaging c/f ileus. Trickle tube feeding attempted 4/22. Pt with ~5L of NG output 4/23. iHD on MWF schedule   Glucose / Insulin: CBG <180 - received 6u SSI in last 24h  Electrolytes: Na 132, K 3.2, Cl 92, CO2 28, CoCa 9.6  Renal: Hemodialysis (MWF schedule) - last 4/24; Phos 5.2 Hepatic: LFTs WNL, Alb 2.3 , Trigs 147 (4/25) Intake / Output; MIVF: NG output = ; 50mL removed with iHD LBM 4/23. GI Imaging:  4/24: KUB: ileus vs SBO 4/25: KUB: persistent, unchanged from 4/24.  GI Surgeries / Procedures: n/a  Central access: CVC (4/24) TPN start date: 4/24   Nutritional Goals: Goal TPN rate is 70 mL/hr (provides 85 g of protein and 1880 kcals per day)  RD Assessment: Estimated Needs Total Energy Estimated Needs: 1750-1950 kcals Total Protein Estimated Needs: 85-105 g Total Fluid Estimated Needs: 1L plus UOP  Current Nutrition:  NPO sips w/ meds  Plan:  Advance TPN to 50 mL/hr at 1800 - to provide ~75% of nutritional goals Electrolytes in TPN: Na 50 mEq/L, K 43mEq/L, Ca 69mEq/L, Mg 59mEq/L, and Phos 80mmol/L. Maximize chloride -change H2RA > PPI d/t medication shortage (ok per MD) Add standard MVI and trace elements, remove chromium in TPN Continue Moderate q4h SSI and adjust as needed  Monitor TPN labs on Mon/Thurs, daily until stable  Calton Dach, PharmD Clinical Pharmacist 02/09/2023 10:16 AM

## 2023-02-09 NOTE — Progress Notes (Signed)
[facility] Progress Note  7 Days Post-Op  Subjective: CC:  states abd pain is improved compared to 2 days ago, reports LLQ pain where she is getting SQ injections. She has started passing flatus, reports about 3x. No BM yet.  Has been started on TPN  Objective: Vital signs in last 24 hours: Temp:  [98 F (36.7 C)-99.1 F (37.3 C)] 99.1 F (37.3 C) (04/25 0802) Pulse Rate:  [62-80] 64 (04/25 0700) Resp:  [13-32] 16 (04/25 0700) BP: (84-131)/(43-106) 102/45 (04/25 0700) SpO2:  [91 %-99 %] 96 % (04/25 0700) Weight:  [92.9 kg-94 kg] 94 kg (04/25 0500) Last BM Date : 02/06/23  Intake/Output from previous day: 04/24 0701 - 04/25 0700 In: 1429.1 [I.V.:892.7; NG/GT:70; IV Piggyback:466.4] Out: 850 [Emesis/NG output:800] Intake/Output this shift: No intake/output data recorded.  PE: Gen:  Alert, NAD, pleasant Card:  Regular rate and rhythm, pedal pulses 2+ BL Pulm:  Normal effort, clear to auscultation bilaterally Abd: Soft, minimally tender without guarding, mild distention and tympany,  incisions C/D/I  NGT in R nare with bilious effluent (200 mL overnight, [phone] mL/24h) - significant improvement from 5L  Cortrak in L nare, clamped. Skin: warm and dry, no rashes  Psych: A&Ox3   Lab Results:  Recent Labs    02/08/23 0043 02/09/23 0355  WBC 7.1 6.6  HGB 8.4* 7.1*  HCT 24.3* 21.4*  PLT 188 166   BMET Recent Labs    02/08/23 0043 02/09/23 0355  NA 127* 132*  K 4.4 3.2*  CL 88* 92*  CO2 22 28  GLUCOSE 85 139*  BUN 72* 47*  CREATININE 9.05* 7.18*  CALCIUM 8.7* 8.2*   PT/INR No results for input(s): "LABPROT", "INR" in the last 72 hours. CMP     Component Value Date/Time   NA 132 (L) 02/09/2023 0355   NA 142 12/05/2022 0000   K 3.2 (L) 02/09/2023 0355   CL 92 (L) 02/09/2023 0355   CO2 28 02/09/2023 0355   GLUCOSE 139 (H) 02/09/2023 0355   BUN 47 (H) 02/09/2023 0355   BUN 44 (A) 12/05/2022 0000   CREATININE 7.18 (H) 02/09/2023 0355    CREATININE 2.72 (H) 07/27/2021 1110   CREATININE 2.74 (H) 03/17/2021 1050   CALCIUM 8.2 (L) 02/09/2023 0355   PROT 6.5 02/09/2023 0355   ALBUMIN 2.3 (L) 02/09/2023 0355   AST 13 (L) 02/09/2023 0355   AST 28 07/27/2021 1110   ALT 8 02/09/2023 0355   ALT 19 07/27/2021 1110   ALKPHOS 62 02/09/2023 0355   BILITOT 0.7 02/09/2023 0355   BILITOT 0.3 07/27/2021 1110   GFRNONAA 6 (L) 02/09/2023 0355   GFRNONAA 20 (L) 07/27/2021 1110   GFRAA 39 (L) 07/14/2020 1053   GFRAA 33 (L) 08/12/2019 1447   Lipase     Component Value Date/Time   LIPASE 51 01/28/2023 2358       Studies/Results: DG Abd Portable 1V  Result Date: 02/08/2023 CLINICAL DATA:  Feeding tube placement EXAM: PORTABLE ABDOMEN - 1 VIEW COMPARISON:  02/08/2023 at 6:24 a.m. FINDINGS: A feeding tube is present with pattern of curvature indicating tube tip at the ligament of Treitz/fourth portion of the duodenum. A right dialysis catheter is present with its tip in the lower part of the right atrium. Separate central line is present with tip at the cavoatrial junction. A nasogastric tube is present with a coil near its side-, and terminating in the stomach body. Continued airspace opacity obscuring the right lung base. Continued bandlike  opacity the left lung base. Moderately dilated loops of upper abdominal small bowel are observed up to about 4.9 cm in diameter, nonspecific for obstruction versus ileus. Overlapping descending thoracic and upper abdominal aortic stents noted. IMPRESSION: 1. Feeding tube tip is at the ligament of Treitz/fourth portion of the duodenum. 2. Moderately dilated loops of upper abdominal small bowel, nonspecific for obstruction versus ileus. 3. Bibasilar airspace opacities, right greater than left, unchanged. 4. Overlapping descending thoracic and upper abdominal aortic stents noted. Electronically Signed   By: Gaylyn Rong M.D.   On: 02/08/2023 16:16   DG Chest Port 1 View  Result Date:  02/08/2023 CLINICAL DATA:  Dyspnea EXAM: PORTABLE CHEST 1 VIEW COMPARISON:  Chest x-ray 02/07/2023.  CT of the chest 02/07/2023 FINDINGS: Thoracic aortic graft/stent again seen. The heart is enlarged, unchanged. There is a small right pleural effusion, unchanged. There are patchy airspace opacities in the left lung base, unchanged. There is no pneumothorax or acute fracture identified. Right-sided central venous catheter tip projects over the right atrium which is similar to prior. There is a new left-sided central venous catheter with distal tip projecting over the distal SVC. Enteric tube extends below the diaphragm. IMPRESSION: 1. Stable cardiomegaly. 2. Stable small right pleural effusion. 3. Stable patchy airspace opacities in the left lung base. 4. New left-sided central venous catheter with distal tip projecting over the distal SVC. Electronically Signed   By: Darliss Cheney M.D.   On: 02/08/2023 14:12   DG Abd Portable 1V  Result Date: 02/08/2023 CLINICAL DATA:  Small-bowel obstruction EXAM: PORTABLE ABDOMEN - 1 VIEW COMPARISON:  Portable exam 0624 hours compared to 02/07/2023 FINDINGS: Prior aortic stenting. Persistent air-filled distention of small bowel loops throughout abdomen no a small amount of colonic gas is seen. Findings could represent small-bowel obstruction or ileus. No definite bowel wall thickening. Bibasilar atelectasis. Enlargement of cardiac silhouette. Central line projects over RIGHT atrium. IMPRESSION: Persistent mild small bowel dilatation though some colonic gas is seen; pattern could reflect ileus or small-bowel obstruction. Bibasilar atelectasis. Electronically Signed   By: Ulyses Southward M.D.   On: 02/08/2023 08:48   CT Angio Chest/Abd/Pel for Dissection W and/or W/WO  Result Date: 02/07/2023 CLINICAL DATA:  Abdominal pain. Recent stent graft or aortic dissection. EXAM: CT ANGIOGRAPHY CHEST, ABDOMEN AND PELVIS TECHNIQUE: Non-contrast CT of the chest was initially obtained.  Multidetector CT imaging through the chest, abdomen and pelvis was performed using the standard protocol during bolus administration of intravenous contrast. Multiplanar reconstructed images and MIPs were obtained and reviewed to evaluate the vascular anatomy. RADIATION DOSE REDUCTION: This exam was performed according to the departmental dose-optimization program which includes automated exposure control, adjustment of the mA and/or kV according to patient size and/or use of iterative reconstruction technique. CONTRAST:  OMNIPAQUE IOHEXOL 350 MG/ML SOLN COMPARISON:  Multiple previous studies. FINDINGS: CTA CHEST FINDINGS Cardiovascular: The heart is borderline enlarged but stable. No pericardial effusion. Interval placement of an aortic stent graft. No worrisome complicating features are identified. The arch vessels are patent. There is some contrast flowing into the false lumen and extending down into the abdominal aorta. Mediastinum/Nodes: No mediastinal or hilar mass or adenopathy. NG tube and feeding tube are coursing down the esophagus and into the stomach. Lungs/Pleura: Small bilateral pleural effusions and bibasilar atelectasis. No pulmonary edema or pneumothorax. Musculoskeletal: No significant bony findings. Review of the MIP images confirms the above findings. CTA ABDOMEN AND PELVIS FINDINGS VASCULAR Aorta: Upper abdominal aorta stent graft. This ends  just above the celiac axis. Stable aortic dissection extending into the left common iliac, left external iliac artery and left common femoral artery. No occlusion. Celiac: Patent SMA: Patent Renals: Stable marked irregularity involving the proximal left renal artery but no occlusion. Is are enhancement pattern of both kidneys likely related to end-stage renal disease. IMA: Patent Inflow: Patent.  Stable left-sided dissection without occlusion. Veins: No significant findings. Review of the MIP images confirms the above findings. NON-VASCULAR  Hepatobiliary: No hepatic lesions or intrahepatic biliary dilatation. Contracted gallbladder with probable gallstones. Pancreas: No inflammation. Spleen: Normal size. Adrenals/Urinary Tract: Stable mild enlargement of both adrenal glands likely hyperplasia. Is are enhancement pattern of both kidneys along with mild right-sided hydronephrosis likely due to end-stage renal disease. The bladder is unremarkable. Stomach/Bowel: Dilated small bowel loops with air-fluid levels with a transition to decompressed/normal distal ileal loops of small bowel suggesting small bowel obstruction likely related to adhesions. Mild distention of the ascending colon but the rest of the colon is largely decompressed and contains some contrast. Lymphatic: Stable borderline enlarged mesenteric and retroperitoneal lymph nodes and stable right inguinal lymphadenopathy. Reproductive: The uterus and ovaries are unremarkable. Other: Small amount of free abdominal and free pelvic fluid. Musculoskeletal: No significant bony findings. Review of the MIP images confirms the above findings. IMPRESSION: 1. Interval placement of an aortic stent graft without worrisome complicating features. 2. 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 3. Aortic branch vessels are patent. 4. Small-bowel obstruction with transition noted in the distal ileum likely due to adhesions. 5. Small bilateral pleural effusions and bibasilar atelectasis. 6. Stable borderline enlarged mesenteric and retroperitoneal lymph nodes and stable right inguinal lymphadenopathy. Electronically Signed   By: Rudie Meyer M.D.   On: 02/07/2023 13:00    Anti-infectives: Anti-infectives (From admission, onward)    Start     Dose/Rate Route Frequency Ordered Stop   02/02/23 1915  ceFAZolin (ANCEF) IVPB 2g/100 mL premix        2 g 200 mL/hr over 30 Minutes Intravenous Every 8 hours 02/02/23 1820 02/03/23  0337   02/02/23 1452  ceFAZolin (ANCEF) 2-4 GM/100ML-% IVPB       Note to Pharmacy: Payton Emerald A: cabinet override      02/02/23 1452 02/03/23 0259   02/02/23 1450  ceFAZolin (ANCEF) 3-0.9 GM/100ML-% IVPB  Status:  Discontinued       Note to Pharmacy: Payton Emerald A: cabinet override      02/02/23 1450 02/02/23 1453   02/02/23 1000  dolutegravir ([name]) tablet 50 mg        50 mg Per Tube Daily 02/01/23 2052     02/02/23 1000  emtricitabine-tenofovir AF (DESCOVY) 200-25 MG per tablet 1 tablet        1 tablet Per Tube Daily 02/01/23 2052     01/30/23 1531  ceFAZolin (ANCEF) IVPB 2g/100 mL premix        over 30 Minutes Intravenous Continuous PRN 01/30/23 1537 01/30/23 1531   01/29/23 1115  dolutegravir ([name]) tablet 50 mg  Status:  Discontinued        50 mg Oral Daily 01/29/23 1021 02/01/23 2052   01/29/23 1115  emtricitabine-tenofovir AF (DESCOVY) 200-25 MG per tablet 1 tablet  Status:  Discontinued        1 tablet Oral Daily 01/29/23 1021 02/01/23 2052        Assessment/Plan  SBO vs ileus 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 Karin Lieu - CTA 4/23 shows interval placement of an aortic stent graft without worrisome complicating features, as well as dilated loops of small bowel with transition noted in the distal ileum concerning for SBO.  - WBC remains WNL and she is hemodynamically stable - NG output decreasing and she is having flatus today which is reassuring. KUB with persistent small bowel dilation along with some air in the colon. She is clinically improving without signs of bowel ischemia. Continue NG to LIWS and await further bowel function. No role for emergent surgery today.  - Given history seems more likely to be an ileus. If she has a distal SBO then it is a partial SBO because contrast from CT made it to her colon.   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  This care required moderate level of medical decision making.   Hosie Spangle, PA-C [facility] Please see Amion for pager number during day hours 7:00am-4:30pm

## 2023-02-09 NOTE — Progress Notes (Signed)
eLink Physician-Brief Progress Note Patient Name: [name] DOB: [birth year] MRN: [id number]   Date of Service  02/09/2023  HPI/Events of Note  K+ 3.2, Cr 7.18  eICU Interventions  KCL 10 meq iv Q 1 hour x 2 ordered.        Thomasene Lot Belle Charlie 02/09/2023, 5:55 AM

## 2023-02-09 NOTE — Progress Notes (Signed)
Bilateral upper extremity vein mapping study completed.   Preliminary results relayed to PA and RN.  Please see CV Procedures for preliminary results.  Christene Lye, RVT  12:33 PM 02/09/23

## 2023-02-09 NOTE — Progress Notes (Signed)
Nutrition Follow-up  DOCUMENTATION CODES:   Non-severe (moderate) malnutrition in context of acute illness/injury  INTERVENTION:   Continuing TPN; discussed nutrition plan with TPN Pharmacist, protein needs increased, plan to go up to 75% full TPN and monitor refeeding labs  NUTRITION DIAGNOSIS:   Moderate Malnutrition related to acute illness as evidenced by energy intake < or equal to 50% for > or equal to 5 days, mild muscle depletion, moderate muscle depletion.  Progressing  GOAL:   Patient will meet greater than or equal to 90% of their needs  Being addressed via TPN  MONITOR:   Diet advancement, TF tolerance, Labs, Weight trends  REASON FOR ASSESSMENT:   Rounds    ASSESSMENT:   57 yo female admitted with Type B aortic dissection, AKI on CKD 4-progressed to ESRD and iHD initiated. PMH DM, CKD 4, HIV, sarcoidosis, HTN, HLD  4/24 TPN initiated 4/25 Abd xray with persistent distention of SB  Remains NPO, TPN initiated yesterday, D10 infusion has been discontinued  Requiring levophed off and on, especially during iHD, to maintain MAP 70-80 to reduce risk of spinal malperfusion Ileus vs partial SBO persists, NG remains to LIS, much less output, 800 mL in 24 hours, 200 mL today. +flatus, awaiting BM Plan is for daily suppositories, reglan  Labs: sodium 132 (L), potassium 3.2 (L), phosphorus 5.2 (trending down from 8.7), magnesium 2.0 (wdl), CBGs 121-135 post initiation of TPN Meds: dulcolax suppository daily, aranesp, colace prn   Diet Order:   Diet Order             Diet NPO time specified Except for: Sips with Meds  Diet effective now                   EDUCATION NEEDS:   Education needs have been addressed  Skin:  Skin Assessment: Reviewed RN Assessment  Last BM:  4/22 small smear; medium stool on 4/21  Height:   Ht Readings from Last 1 Encounters:  01/28/23  (1.651 m)    Weight:   Wt Readings from Last 1 Encounters:  02/09/23 94 kg       BMI:  Body mass index is 34.49 kg/m.  Estimated Nutritional Needs:   Kcal:  1750-1950 kcals  Protein:  85-105 g  Fluid:  1L plus UOP    Romelle Starcher MS, RDN, LDN, CNSC Registered Dietitian 3 Clinical Nutrition RD Pager and On-Call Pager Number Located in [place]

## 2023-02-09 NOTE — Progress Notes (Addendum)
  Progress Note    02/09/2023 7:54 AM 7 Days Post-Op  Subjective:  Denies BLE numbness or weakness.  Flatus last night   Vitals:   02/09/23 0600 02/09/23 0700  BP: (!) 113/47 (!) 102/45  Pulse: 68 64  Resp: 19 16  Temp:    SpO2: 96% 96%   Physical Exam: Lungs:  non labored Incisions:  R groin well healed Extremities:  palpable and symmetrical DP pulses Abdomen:  soft, NT, less distended Neurologic: A&O  CBC    Component Value Date/Time   WBC 6.6 02/09/2023 0355   RBC 2.19 (L) 02/09/2023 0355   HGB 7.1 (L) 02/09/2023 0355   HGB 11.7 (L) 07/27/2021 1110   HCT 21.4 (L) 02/09/2023 0355   PLT 166 02/09/2023 0355   PLT 228 07/27/2021 1110   MCV 97.7 02/09/2023 0355   MCH 32.4 02/09/2023 0355   MCHC 33.2 02/09/2023 0355   RDW 16.2 (H) 02/09/2023 0355   LYMPHSABS 1.6 12/19/2022 1242   MONOABS 0.7 12/19/2022 1242   EOSABS 0.2 12/19/2022 1242   BASOSABS 0.0 12/19/2022 1242    BMET    Component Value Date/Time   NA 132 (L) 02/09/2023 0355   NA 142 12/05/2022 0000   K 3.2 (L) 02/09/2023 0355   CL 92 (L) 02/09/2023 0355   CO2 28 02/09/2023 0355   GLUCOSE 139 (H) 02/09/2023 0355   BUN 47 (H) 02/09/2023 0355   BUN 44 (A) 12/05/2022 0000   CREATININE 7.18 (H) 02/09/2023 0355   CREATININE 2.72 (H) 07/27/2021 1110   CREATININE 2.74 (H) 03/17/2021 1050   CALCIUM 8.2 (L) 02/09/2023 0355   GFRNONAA 6 (L) 02/09/2023 0355   GFRNONAA 20 (L) 07/27/2021 1110   GFRAA 39 (L) 07/14/2020 1053   GFRAA 33 (L) 08/12/2019 1447    INR    Component Value Date/Time   INR 1.0 07/14/2019 0312     Intake/Output Summary (Last 24 hours) at 02/09/2023 0754 Last data filed at 02/09/2023 0700 Gross per 24 hour  Intake 1429.1 ml  Output 850 ml  Net 579.1 ml     Assessment/Plan:  57 y.o. female is s/p TEVAR 7 Days Post-Op   BLE well perfused with palpable DP pulses and stable motor and sensation exam Abd continues to feel better.  Flatus last night; management per surgery  team Dialysis access when more stable   Emilie Rutter, PA-C [facility] [phone] 02/09/2023 7:54 AM   VASCULAR STAFF ADDENDUM: I agree with the above.  No plan for further intervention at this time. Main hospital problem at this point is ileus.  Access prior to D/c  J. Gillis Santa, MD [facility] Phone Number: [phone] 02/09/2023 2:14 PM

## 2023-02-09 NOTE — Progress Notes (Signed)
eLink Physician-Brief Progress Note Patient Name: [name] DOB: 27-Jul-1966 MRN: [id number]   Date of Service  02/09/2023  HPI/Events of Note  Patient is on TPN as well as D 10 % water gtt.  eICU Interventions  D 10 % water gtt discontinued.        Migdalia Dk 02/09/2023, 4:46 AM

## 2023-02-09 NOTE — Progress Notes (Signed)
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  Started HD due to uremic symptoms. S/p [facility] 4/15. IHD #1 4/16 - HD per MWF schedule for now  - CLIP underway for outpatient HD unit placement - She states that she is no longer interested in PD.  Would like to do in-center HD.  Will need AVF/AVG and vein mapping and access planning once stabilized.  Looks quite a bit better today but still in ICU -Follow daily labs, strict I/O  # Type B dissection -VVS following. S/p TEVAR 4/18 -BP control as below  # Encephalopathy - Per charting mentation has improved which may correlate with HD  # HTN   - goals per vascular, CCM.  She was on levo for a short time but is now off then on again briefly with HD on 4/24  - optimize volume as tolerated with HD - I have discontinued amlodipine and hydralazine as they have been held.  Her losartan and coreg have been intermittently held.  I have discontinued losartan to prioritize re-addition of her beta blocker.  I reduced coreg to 12.5 mg BID for now.  For now only on coreg and BP ok  # HIV - therapy per primary team   # DM2 -per primary service  # Urinary retention   -UA re-ordered - not yet obtained and only intermittently makes urine   # Hyponatremia - optimize with HD/UF  # Small bowel obstruction - per primary team   # Normocytic Anemia  - transfusions per primary team  - iron deficiency - will ferrlecit x 2 doses started 4/24 - Gave aranesp 40 mcg once on 4/24.  Plan for 60 mcg weekly on Wednesdays going forward for now  # Hyperphosphatemia  - Improving with HD  - start binder once taking PO - she has an NG tube and a small bowel obstruction.    Disposition - Continue inpatient monitoring    Subjective:   She is still in the ICU.  Last HD on 4/24 with 50 mL UF (she was essentially kept even due to hypotension). She required levo  during HD.  She had no urine output over 4/24 charted.  She had 800 mL output from NG over 4/24 (compared to 5.0 liters the day prior).  She feels better today. She got coreg yesterday x 2 doses.  RN recycled her pressure and she was 120's on exam this am for me   Review of systems:   Denies chest pain  Denies shortness of breath  Abdominal pain is much better - she still rates as a 4/5 out of 10 but better than yesterday.  NG output as above.  She passed gas overnight.  Walked around some yesterday    Objective:   BP (!) 107/59   Pulse 68   Temp 98.4 F (36.9 C) (Oral)   Resp 15   Ht  (1.651 m)   Wt 92.9 kg   LMP 03/31/2014 (LMP Unknown)   SpO2 95%   BMI 34.08 kg/m   Intake/Output Summary (Last 24 hours) at 02/09/2023 0610 Last data filed at 02/09/2023 0500 Gross per 24 hour  Intake 1818.94 ml  Output 1250 ml  Net 568.94 ml   Weight change: 0 kg  Physical Exam:      General adult female in bed in no acute distress HEENT normocephalic atraumatic extraocular movements intact sclera anicteric  Neck supple trachea midline Lungs clear to auscultation bilaterally anteriorly unlabored at rest; on 3 liters oxygen Heart S1S2 no rub Abdomen soft mildly tender to palpation; less distended. obese habitus Extremities no edema lower extremities  Psych no anxiety or agitation Neuro patient is alert and conversant; provides history and follows commands Access: RIJ tunn catheter in place    Imaging: DG Abd Portable 1V  Result Date: 02/08/2023 CLINICAL DATA:  Feeding tube placement EXAM: PORTABLE ABDOMEN - 1 VIEW COMPARISON:  02/08/2023 at 6:24 a.m. FINDINGS: A feeding tube is present with pattern of curvature indicating tube tip at the ligament of Treitz/fourth portion of the duodenum. A right dialysis catheter is present with its tip in the lower part of the right atrium. Separate central line is present with tip at the cavoatrial junction. A nasogastric tube is present with a coil  near its side-, and terminating in the stomach body. Continued airspace opacity obscuring the right lung base. Continued bandlike opacity the left lung base. Moderately dilated loops of upper abdominal small bowel are observed up to about 4.9 cm in diameter, nonspecific for obstruction versus ileus. Overlapping descending thoracic and upper abdominal aortic stents noted. IMPRESSION: 1. Feeding tube tip is at the ligament of Treitz/fourth portion of the duodenum. 2. Moderately dilated loops of upper abdominal small bowel, nonspecific for obstruction versus ileus. 3. Bibasilar airspace opacities, right greater than left, unchanged. 4. Overlapping descending thoracic and upper abdominal aortic stents noted. Electronically Signed   By: Gaylyn Rong M.D.   On: 02/08/2023 16:16   DG Chest Port 1 View  Result Date: 02/08/2023 CLINICAL DATA:  Dyspnea EXAM: PORTABLE CHEST 1 VIEW COMPARISON:  Chest x-ray 02/07/2023.  CT of the chest 02/07/2023 FINDINGS: Thoracic aortic graft/stent again seen. The heart is enlarged, unchanged. There is a small right pleural effusion, unchanged. There are patchy airspace opacities in the left lung base, unchanged. There is no pneumothorax or acute fracture identified. Right-sided central venous catheter tip projects over the right atrium which is similar to prior. There is a new left-sided central venous catheter with distal tip projecting over the distal SVC. Enteric tube extends below the diaphragm. IMPRESSION: 1. Stable cardiomegaly. 2. Stable small right pleural effusion. 3. Stable patchy airspace opacities in the left lung base. 4. New left-sided central venous catheter with distal tip projecting over the distal SVC. Electronically Signed   By: Darliss Cheney M.D.   On: 02/08/2023 14:12   DG Abd Portable 1V  Result Date: 02/08/2023 CLINICAL DATA:  Small-bowel obstruction EXAM: PORTABLE ABDOMEN - 1 VIEW COMPARISON:  Portable exam 0624 hours compared to 02/07/2023 FINDINGS:  Prior aortic stenting. Persistent air-filled distention of small bowel loops throughout abdomen no a small amount of colonic gas is seen. Findings could represent small-bowel obstruction or ileus. No definite bowel wall thickening. Bibasilar atelectasis. Enlargement of cardiac silhouette. Central line projects over RIGHT atrium. IMPRESSION: Persistent mild small bowel dilatation though some colonic gas is seen; pattern could reflect ileus or small-bowel obstruction. Bibasilar atelectasis. Electronically Signed   By: Ulyses Southward M.D.   On: 02/08/2023 08:48   CT Angio Chest/Abd/Pel for Dissection W and/or W/WO  Result Date: 02/07/2023 CLINICAL DATA:  Abdominal pain. Recent stent graft or aortic dissection. EXAM: CT ANGIOGRAPHY CHEST, ABDOMEN AND PELVIS TECHNIQUE: Non-contrast CT of the chest was initially obtained. Multidetector CT imaging through the chest, abdomen and pelvis was performed using the standard protocol during bolus administration of intravenous contrast. Multiplanar reconstructed images and MIPs were obtained  and reviewed to evaluate the vascular anatomy. RADIATION DOSE REDUCTION: This exam was performed according to the departmental dose-optimization program which includes automated exposure control, adjustment of the mA and/or kV according to patient size and/or use of iterative reconstruction technique. CONTRAST:  OMNIPAQUE IOHEXOL 350 MG/ML SOLN COMPARISON:  Multiple previous studies. FINDINGS: CTA CHEST FINDINGS Cardiovascular: The heart is borderline enlarged but stable. No pericardial effusion. Interval placement of an aortic stent graft. No worrisome complicating features are identified. The arch vessels are patent. There is some contrast flowing into the false lumen and extending down into the abdominal aorta. Mediastinum/Nodes: No mediastinal or hilar mass or adenopathy. NG tube and feeding tube are coursing down the esophagus and into the stomach. Lungs/Pleura: Small bilateral  pleural effusions and bibasilar atelectasis. No pulmonary edema or pneumothorax. Musculoskeletal: No significant bony findings. Review of the MIP images confirms the above findings. CTA ABDOMEN AND PELVIS FINDINGS VASCULAR Aorta: Upper abdominal aorta stent graft. This ends just above the celiac axis. Stable aortic dissection extending into the left common iliac, left external iliac artery and left common femoral artery. No occlusion. Celiac: Patent SMA: Patent Renals: Stable marked irregularity involving the proximal left renal artery but no occlusion. Is are enhancement pattern of both kidneys likely related to end-stage renal disease. IMA: Patent Inflow: Patent.  Stable left-sided dissection without occlusion. Veins: No significant findings. Review of the MIP images confirms the above findings. NON-VASCULAR Hepatobiliary: No hepatic lesions or intrahepatic biliary dilatation. Contracted gallbladder with probable gallstones. Pancreas: No inflammation. Spleen: Normal size. Adrenals/Urinary Tract: Stable mild enlargement of both adrenal glands likely hyperplasia. Is are enhancement pattern of both kidneys along with mild right-sided hydronephrosis likely due to end-stage renal disease. The bladder is unremarkable. Stomach/Bowel: Dilated small bowel loops with air-fluid levels with a transition to decompressed/normal distal ileal loops of small bowel suggesting small bowel obstruction likely related to adhesions. Mild distention of the ascending colon but the rest of the colon is largely decompressed and contains some contrast. Lymphatic: Stable borderline enlarged mesenteric and retroperitoneal lymph nodes and stable right inguinal lymphadenopathy. Reproductive: The uterus and ovaries are unremarkable. Other: Small amount of free abdominal and free pelvic fluid. Musculoskeletal: No significant bony findings. Review of the MIP images confirms the above findings. IMPRESSION: 1. Interval placement of an aortic stent  graft without worrisome complicating features. 2. 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 3. Aortic branch vessels are patent. 4. Small-bowel obstruction with transition noted in the distal ileum likely due to adhesions. 5. Small bilateral pleural effusions and bibasilar atelectasis. 6. Stable borderline enlarged mesenteric and retroperitoneal lymph nodes and stable right inguinal lymphadenopathy. Electronically Signed   By: Rudie Meyer M.D.   On: 02/07/2023 13:00   DG Chest Port 1 View  Result Date: 02/07/2023 CLINICAL DATA:  SOB (shortness of breath) EXAM: PORTABLE CHEST 1 VIEW COMPARISON:  Chest x-ray 10/13/2020. FINDINGS: Low lung volumes with bibasilar opacities and probable small right pleural effusion. No visible pneumothorax. Aortic stent. Mildly enlarged cardiac silhouette, accentuated by technique. Right IJ central venous catheter with the tip projecting at the lower right atrium. Enteric and gastric tube courses below the diaphragm outside the field of view. IMPRESSION: Low lung volumes with bibasilar opacities and probable small right pleural effusion. Electronically Signed   By: Feliberto Harts M.D.   On: 02/07/2023 10:32   DG Abd 1 View  Result Date: 02/07/2023 CLINICAL DATA:  Shortness of  breath. EXAM: ABDOMEN - 1 VIEW COMPARISON:  02/06/2023 FINDINGS: Persistent bibasilar collapse/consolidation, right greater than left with right-sided pleural effusion. Diffuse gaseous distension of small bowel again noted mildly progressive in the interval. Colonic distension seen on previous studies has decreased in the interval. NG tube tip is folded in the distal stomach with the tip in the antral region. Feeding tube tip is transpyloric left the level of the ligament of Treitz. IMPRESSION: 1. Persistent bibasilar collapse/consolidation, right greater than left with right-sided pleural effusion. 2. Diffuse gaseous  distension of small bowel mildly progressive in the interval. Colonic distension has decreased in the interval. Electronically Signed   By: Kennith Center M.D.   On: 02/07/2023 10:23    Labs: BMET Recent Labs  Lab 02/03/23 0419 02/04/23 0309 02/05/23 0128 02/05/23 0129 02/06/23 0042 02/07/23 0034 02/08/23 0043 02/09/23 0355  NA 129* 129*  --  131* 128* 130* 127* 132*  K 4.1 3.6  --  3.6 4.3 3.8 4.4 3.2*  CL 93* 94*  --  95* 93* 93* 88* 92*  CO2 20* 24  --  23 19* GLUCOSE 145* 169*  --  159* 113* 174* 85 139*  BUN 53* 39*  --  55* 71* 50* 72* 47*  CREATININE 8.45* 6.68*  --  7.83* 9.08* 6.71* 9.05* 7.18*  CALCIUM 7.2* 7.4*  --  8.0* 8.2* 8.4* 8.7* 8.2*  PHOS 9.9* 6.0* 7.0* 7.0* 8.8* 6.3* 8.4* 5.2*   CBC Recent Labs  Lab 02/06/23 0042 02/07/23 0034 02/08/23 0043 02/09/23 0355  WBC 16.7* 11.6* 7.1 6.6  HGB 8.3* 8.3* 8.4* 7.1*  HCT 24.7* 23.8* 24.3* 21.4*  MCV 96.5 94.4 94.2 97.7  PLT 238 223 188 166    Medications:     aspirin  81 mg Per Tube Daily   bisacodyl  10 mg Rectal Q0600   carvedilol  12.5 mg Per Tube BID   Chlorhexidine Gluconate Cloth  6 each Topical Daily   Chlorhexidine Gluconate Cloth  6 each Topical Q0600   clotrimazole  10 mg Oral 5 X Daily   dolutegravir  50 mg Per Tube Daily   emtricitabine-tenofovir AF  1 tablet Per Tube Daily   famotidine  20 mg Per Tube Daily   heparin  5,000 Units Subcutaneous Q8H   insulin aspart  0-15 Units Subcutaneous Q4H   levothyroxine  175 mcg Per Tube Q0600   lidocaine  1 patch Transdermal Q24H   metoCLOPramide (REGLAN) injection  5 mg Intravenous Q12H   mouth rinse  15 mL Mouth Rinse 4 times per day   rosuvastatin  10 mg Per Tube Daily   sertraline  50 mg Per Tube Daily     Estanislado Emms, MD 6:41 AM 02/09/2023

## 2023-02-09 NOTE — Progress Notes (Signed)
NAME:  [name], MRN:  [id number], DOB:  [birth year], LOS: 11 ADMISSION DATE:  01/29/2023, CONSULTATION DATE:  01/29/23 REFERRING MD:  EDP, CHIEF COMPLAINT:  aortic dissection   History of Present Illness:  57 yo female with pmh t2dm, ckd4, hiv, sarcoidosis who prsented to [facility] with complaints of back and abdominal pain with nausea and non bloody vomiting. Her family at bedside endorse this has been ongoing for 2 days not. Family provides most history as pt is quite drowsy after pain medication. The state that pt had no other associated symptoms. She had been trying to treat herself for consitpation thinking that was the cause of her pain. No fevers/ chills. No diarrhea. No recent change in medications.   She presented with elevated BP. Systolics >200. She was started on esmolol and nitro gtts and vascular is coming to eval.   Pertinent  Medical History  T2dm Htn Hyperlipidemia Hiv Hypothyroidism ckd4  Significant Hospital Events: Including procedures, antibiotic start and stop dates in addition to other pertinent events   Admitted to hospital 4/14, on cardene and esmolol 4/15 TDC placement, uremic encephalopathy 4/18 CT angio c/a/b, AO bifem. TEVAR  4/19 AKI on CKD requiring RRT  4/20 onward developed ileus vs SBO and intermittent pressor need  Interim History / Subjective:  Doing okay; output from NGT improved after retraction into stomach.  TPN started.  Bps still marginal  + flatus  Objective   Blood pressure (!) 102/45, pulse 64, temperature 99.1 F (37.3 C), temperature source Oral, resp. rate 16, height  (1.651 m), weight 94 kg, last menstrual period 03/31/2014, SpO2 96 %.        Intake/Output Summary (Last 24 hours) at 02/09/2023 0816 Last data filed at 02/09/2023 0700 Gross per 24 hour  Intake 1404.6 ml  Output 850 ml  Net 554.6 ml    Filed Weights   02/08/23 0722 02/08/23 1105 02/09/23 0500  Weight: 92.9 kg 92.9 kg 94 kg   No  distress Malampatti 4 MMM, trachea midline Lungs diminished bases Abd still distended but nontender (except at old heparin injection site) LE remain neurovascularly fine  BUN/cr up without hd H/H down  Resolved Hospital Problem list   Acute encephalopathy  Assessment & Plan:   Type B dissection s/p TEVAR (4/18) -- proximal descending thoracic aorta distal to origin of L subclav to iliacs, extending LLE to bifurcation of superficial and profunda fem arteries  Hx HTN Low Bps+ LE parasthesia- improved with levophed PRN - Levophed for MAP goal 70-80 to reduce risk of spinal malperfusion, unclear when we can liberalize this goal - All antihypertensives on hold - Consideration to start midodrine if vasoplegia persists despite ileus resolving  High suspicion OSA-  outpatient sleep referral  Ileus vs. Less likely SBO- seems to be improving, appreciate CCS help At risk malnutrition - Continue NGT to LIS - TPN per PharmD - Daily suppositories - Continue reglan  AKI on CKD 5 -- now ESRD  Hyponatremia  Hyperphosphatemia AGMA - iHD per nephrology, appreciate their assistance. Phos improving with dialysis sessions, K stable.    - Negative as allowed by BP  Normocytic anemia Likely in setting of chronic disease.  -ESA per nephrology -a bit lower today, hold heparin, 12PM type/screen and CBC, goal Hgb 7-8  DM2   -SSI   HIV;  well controlled recently with undetectable VL and CD4 685 in 12/2022 -cont tivicay, descovy per PP cortrak. Try to avoid missed doses to prevent resistance  Hypothyroidism-  last TSH 2.6 08/23/22 -synthroid   GERD -PPI  Best Practice (right click and "Reselect all SmartList Selections" daily)   Diet/type: TPN DVT prophylaxis: on hold pending stable H/H GI prophylaxis: IV H2B Lines: Dialysis Catheter, TLC Foley:  N/A Code Status:  full code Last date of multidisciplinary goals of care discussion [patient updated 4/25]  33 min cc time Myrla Halsted MD  PCCM

## 2023-02-10 ENCOUNTER — Inpatient Hospital Stay (HOSPITAL_COMMUNITY): Payer: Medicare Other

## 2023-02-10 DIAGNOSIS — I7103 Dissection of thoracoabdominal aorta: Secondary | ICD-10-CM | POA: Diagnosis not present

## 2023-02-10 LAB — PREPARE RBC (CROSSMATCH)

## 2023-02-10 LAB — CBC
HCT: 21.3 % — ABNORMAL LOW (ref 36.0–46.0)
Hemoglobin: 7.1 g/dL — ABNORMAL LOW (ref 12.0–15.0)
MCH: 32.7 pg (ref 26.0–34.0)
MCHC: 33.3 g/dL (ref 30.0–36.0)
MCV: 98.2 fL (ref 80.0–100.0)
Platelets: 178 10*3/uL (ref 150–400)
RBC: 2.17 MIL/uL — ABNORMAL LOW (ref 3.87–5.11)
RDW: 16 % — ABNORMAL HIGH (ref 11.5–15.5)
WBC: 9.2 10*3/uL (ref 4.0–10.5)
nRBC: 0 % (ref 0.0–0.2)

## 2023-02-10 LAB — GLUCOSE, CAPILLARY
Glucose-Capillary: 130 mg/dL — ABNORMAL HIGH (ref 70–99)
Glucose-Capillary: 142 mg/dL — ABNORMAL HIGH (ref 70–99)
Glucose-Capillary: 145 mg/dL — ABNORMAL HIGH (ref 70–99)
Glucose-Capillary: 154 mg/dL — ABNORMAL HIGH (ref 70–99)
Glucose-Capillary: 183 mg/dL — ABNORMAL HIGH (ref 70–99)
Glucose-Capillary: 212 mg/dL — ABNORMAL HIGH (ref 70–99)

## 2023-02-10 LAB — MAGNESIUM: Magnesium: 1.7 mg/dL (ref 1.7–2.4)

## 2023-02-10 LAB — COMPREHENSIVE METABOLIC PANEL
ALT: <5 U/L (ref 0–44)
AST: 12 U/L — ABNORMAL LOW (ref 15–41)
Albumin: 2.2 g/dL — ABNORMAL LOW (ref 3.5–5.0)
Alkaline Phosphatase: 59 U/L (ref 38–126)
Anion gap: 14 (ref 5–15)
BUN: 63 mg/dL — ABNORMAL HIGH (ref 6–20)
CO2: 25 mmol/L (ref 22–32)
Calcium: 8.2 mg/dL — ABNORMAL LOW (ref 8.9–10.3)
Chloride: 91 mmol/L — ABNORMAL LOW (ref 98–111)
Creatinine, Ser: 8.61 mg/dL — ABNORMAL HIGH (ref 0.44–1.00)
GFR, Estimated: 5 mL/min — ABNORMAL LOW (ref >60)
Glucose, Bld: 138 mg/dL — ABNORMAL HIGH (ref 70–99)
Potassium: 3.2 mmol/L — ABNORMAL LOW (ref 3.5–5.1)
Sodium: 130 mmol/L — ABNORMAL LOW (ref 135–145)
Total Bilirubin: 0.5 mg/dL (ref 0.3–1.2)
Total Protein: 6.5 g/dL (ref 6.5–8.1)

## 2023-02-10 LAB — TYPE AND SCREEN

## 2023-02-10 LAB — HEMOGLOBIN AND HEMATOCRIT, BLOOD
HCT: 23.3 % — ABNORMAL LOW (ref 36.0–46.0)
Hemoglobin: 8 g/dL — ABNORMAL LOW (ref 12.0–15.0)

## 2023-02-10 LAB — BPAM RBC
Blood Product Expiration Date: 202404272359
ISSUE DATE / TIME: 202404260845
Unit Type and Rh: 5100

## 2023-02-10 LAB — PHOSPHORUS: Phosphorus: 4.8 mg/dL — ABNORMAL HIGH (ref 2.5–4.6)

## 2023-02-10 MED ORDER — FLUCONAZOLE 200 MG PO TABS
200.0000 mg | ORAL_TABLET | Freq: Once | ORAL | Status: AC
  Administered 2023-02-10: 200 mg
  Filled 2023-02-10 (×2): qty 1

## 2023-02-10 MED ORDER — POTASSIUM CHLORIDE 10 MEQ/50ML IV SOLN
10.0000 meq | INTRAVENOUS | Status: AC
  Administered 2023-02-10 (×3): 10 meq via INTRAVENOUS
  Filled 2023-02-10 (×2): qty 50

## 2023-02-10 MED ORDER — TRAVASOL 10 % IV SOLN
INTRAVENOUS | Status: AC
  Filled 2023-02-10: qty 856.8

## 2023-02-10 MED ORDER — POTASSIUM CHLORIDE 10 MEQ/50ML IV SOLN
10.0000 meq | INTRAVENOUS | Status: DC
  Filled 2023-02-10 (×3): qty 50

## 2023-02-10 MED ORDER — FLUCONAZOLE 100 MG PO TABS
100.0000 mg | ORAL_TABLET | Freq: Every day | ORAL | Status: AC
  Administered 2023-02-11 – 2023-02-17 (×7): 100 mg
  Filled 2023-02-10 (×7): qty 1

## 2023-02-10 MED ORDER — SODIUM CHLORIDE 0.9% IV SOLUTION: Freq: Once | INTRAVENOUS | Status: AC

## 2023-02-10 MED ORDER — VITAL 1.5 CAL PO LIQD
1000.0000 mL | ORAL | Status: DC
  Administered 2023-02-10: 1000 mL

## 2023-02-10 MED ORDER — PROSOURCE TF20 ENFIT COMPATIBL EN LIQD
60.0000 mL | Freq: Every day | ENTERAL | Status: DC
  Administered 2023-02-10 – 2023-02-16 (×7): 60 mL
  Filled 2023-02-10 (×7): qty 60

## 2023-02-10 MED ORDER — NYSTATIN 100000 UNIT/ML MT SUSP
5.0000 mL | Freq: Four times a day (QID) | OROMUCOSAL | Status: DC
  Administered 2023-02-10 – 2023-02-18 (×30): 500000 [IU] via ORAL
  Filled 2023-02-10 (×30): qty 5

## 2023-02-10 MED ORDER — NEPRO/CARBSTEADY PO LIQD: 10.0000 mL/h | ORAL | Status: DC

## 2023-02-10 MED ORDER — ALBUMIN HUMAN 25 % IV SOLN
INTRAVENOUS | Status: AC
  Filled 2023-02-10: qty 200

## 2023-02-10 NOTE — Progress Notes (Addendum)
  Progress Note    02/10/2023 7:57 AM 8 Days Post-Op  Subjective:  bowel movement last night   Vitals:   02/10/23 0615 02/10/23 0700  BP: (!) 138/50 (!) 119/46  Pulse: 72 68  Resp: 17 16  Temp:    SpO2: 96% 100%   Physical Exam: Lungs:  non labored Incisions:  R groin incision healed Extremities:  palpable DP pulses Abdomen:  soft, NT, ND Neurologic: A&O  CBC    Component Value Date/Time   WBC 9.2 02/10/2023 0352   RBC 2.17 (L) 02/10/2023 0352   HGB 7.1 (L) 02/10/2023 0352   HGB 11.7 (L) 07/27/2021 1110   HCT 21.3 (L) 02/10/2023 0352   PLT 178 02/10/2023 0352   PLT 228 07/27/2021 1110   MCV 98.2 02/10/2023 0352   MCH 32.7 02/10/2023 0352   MCHC 33.3 02/10/2023 0352   RDW 16.0 (H) 02/10/2023 0352   LYMPHSABS 1.6 12/19/2022 1242   MONOABS 0.7 12/19/2022 1242   EOSABS 0.2 12/19/2022 1242   BASOSABS 0.0 12/19/2022 1242    BMET    Component Value Date/Time   NA 130 (L) 02/10/2023 0352   NA 142 12/05/2022 0000   K 3.2 (L) 02/10/2023 0352   CL 91 (L) 02/10/2023 0352   CO2 25 02/10/2023 0352   GLUCOSE 138 (H) 02/10/2023 0352   BUN 63 (H) 02/10/2023 0352   BUN 44 (A) 12/05/2022 0000   CREATININE 8.61 (H) 02/10/2023 0352   CREATININE 2.72 (H) 07/27/2021 1110   CREATININE 2.74 (H) 03/17/2021 1050   CALCIUM 8.2 (L) 02/10/2023 0352   GFRNONAA 5 (L) 02/10/2023 0352   GFRNONAA 20 (L) 07/27/2021 1110   GFRAA 39 (L) 07/14/2020 1053   GFRAA 33 (L) 08/12/2019 1447    INR    Component Value Date/Time   INR 1.0 07/14/2019 0312     Intake/Output Summary (Last 24 hours) at 02/10/2023 0757 Last data filed at 02/10/2023 0700 Gross per 24 hour  Intake 1624.03 ml  Output 900 ml  Net 724.03 ml     Assessment/Plan:  57 y.o. female is s/p TEVAR 8 Days Post-Op   BLE well perfused with palpable DP pulses and stable motor and sensation exam Plans noted for transfusion with Hgb 7 Abd continues to feel better.  BM last night; management per surgery team Dialysis  access when more stable   Emilie Rutter, PA-C [facility] [phone] 02/10/2023 7:57 AM

## 2023-02-10 NOTE — Progress Notes (Signed)
PHARMACY - TOTAL PARENTERAL NUTRITION CONSULT NOTE   Indication: Prolonged ileus   Patient Measurements: Height: 5\' 5"  (165.1 cm) Weight: 96.7 kg (213 lb 3 oz) IBW/kg (Calculated) : 57 TPN AdjBW (KG): 66.8 Body mass index is 35.48 kg/m. Usual Weight: ~99 kg  Assessment:  57 yo F initially presented with type B aortic dissection now s/p TEVAR (4/18). Pt now with ongoing abdominal pain and dilated bowel loops on imaging c/f ileus. Trickle tube feeding attempted 4/22. Pt with ~5L of NG output 4/23. iHD on MWF schedule   Glucose / Insulin: CBG <180 - received 14u SSI in last 24h  Electrolytes: Na 130, K 3.2 (s/p IV KCl ), Cl 91, CO2 25, CoCa 9.6  Renal: Hemodialysis (MWF schedule) - last 4/24; Phos 4.8 Hepatic: LFTs WNL, Alb 2.2, Trigs 147 (4/25) Intake / Output; MIVF: NG output = 600 mL; 50mL removed with iHD LBM 4/25. GI Imaging:  4/24: KUB: ileus vs SBO 4/25: KUB: persistent, unchanged from 4/24 4/26: KUB: stable, no significant change  GI Surgeries / Procedures: n/a  Central access: CVC (4/24) TPN start date: 4/24   Nutritional Goals: Goal TPN rate is 70 mL/hr (provides 85 g of protein and 1880 kcals per day)  RD Assessment: Estimated Needs Total Energy Estimated Needs: 1750-1950 kcals Total Protein Estimated Needs: 85-105 g Total Fluid Estimated Needs: 1L plus UOP  Current Nutrition:  NPO > potential start trickle feeds 4/26   Plan:  Advance TPN to 70 mL/hr at 1800 - to provide 100% of nutritional goals Electrolytes in TPN: Na 56 mEq/L, K 6 mEq/L ( total), Ca 2 mEq/L, Mg 5 mEq/L, and Phos 22mmol/L. Maximize chloride -Additional KCL IV x3 this AM  -f/u tolerance of enteral feeds to TPN weaning  Add standard MVI and trace elements, remove chromium in TPN Continue Moderate q4h SSI and adjust as needed  Monitor TPN labs on Mon/Thurs, daily until stable  Calton Dach, PharmD Clinical Pharmacist 02/10/2023 8:56 AM

## 2023-02-10 NOTE — Progress Notes (Addendum)
NAME:  Kasidee Voisin, MRN:  [id number], DOB:  30-May-1966, LOS: 12 ADMISSION DATE:  01/29/2023, CONSULTATION DATE:  01/29/23 REFERRING MD:  EDP, CHIEF COMPLAINT:  aortic dissection   History of Present Illness:  57 yo female with pmh t2dm, ckd4, hiv, sarcoidosis who prsented to [facility] with complaints of back and abdominal pain with nausea and non bloody vomiting. Her family at bedside endorse this has been ongoing for 2 days not. Family provides most history as pt is quite drowsy after pain medication. The state that pt had no other associated symptoms. She had been trying to treat herself for consitpation thinking that was the cause of her pain. No fevers/ chills. No diarrhea. No recent change in medications.   She presented with elevated BP. Systolics >200. She was started on esmolol and nitro gtts and vascular is coming to eval.   Pertinent  Medical History  T2dm Htn Hyperlipidemia Hiv Hypothyroidism ckd4  Significant Hospital Events: Including procedures, antibiotic start and stop dates in addition to other pertinent events   Admitted to hospital 4/14, on cardene and esmolol 4/15 TDC placement, uremic encephalopathy 4/18 CT angio c/a/b, AO bifem. TEVAR  4/19 AKI on CKD requiring RRT  4/20 onward developed ileus vs SBO and intermittent pressor need  Interim History / Subjective:  Had additional bowel movement and continuing to pass gas.   Objective   Blood pressure (!) 141/57, pulse 68, temperature 98.6 F (37 C), resp. rate 19, height 5\' 5"  (1.651 m), weight 96.7 kg, last menstrual period 03/31/2014, SpO2 93 %.        Intake/Output Summary (Last 24 hours) at 02/10/2023 1557 Last data filed at 02/10/2023 1400 Gross per 24 hour  Intake 1981.18 ml  Output 400 ml  Net 1581.18 ml    Filed Weights   02/09/23 0500 02/10/23 0500 02/10/23 1450  Weight: 94 kg 96.7 kg 96.7 kg   Constitutional: obese, no acute distress HENT: normocephalic atraumatic Eyes: conjunctiva  non-erythematous Neck: supple Pulmonary/Chest: normal work of breathing on 3L [place] Abdominal: soft, non-tender, improved distension MSK: normal bulk and tone. No lower extremity edema. 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 Less likely SBO- improving two bowel movements  At risk malnutrition -continue NGT -start trickle feeds, NGT is post pyloric  -if tolerats trickle can discontinue TPN -daily suppositories  -continue reglan  AKI on CKD 5 -- now ESRD  Hyponatremia  Hyperphosphatemia Hypokalemia AGMA -iHD per nephrology, appreciate their assistance.  -electrolyte management per nephrology  Normocytic anemia Likely in setting of chronic disease. Hemoglobin of 7.1 today, transfused 1 U  -ESA per nephrology -continue to hold heparin, post transfusion H/H  DM2   -SSI   HIV;  well controlled recently with undetectable VL and CD4 685 in 12/2022 -cont tivicay, descovy per PP cortrak. Try to avoid missed doses to prevent resistance  Thrush Will treat with diflucan with history of HIV  and patietn endorsing some throat discomfort -diflucan for two doses, end 04/27 -nystatin swish and swallow  Hypothyroidism- last TSH 2.6 08/23/22 -synthroid   GERD -PPI  Best Practice (right click and "Reselect all SmartList Selections" daily)   Diet/type: TPN,  trickle feeds DVT prophylaxis: on hold with low hemoglobin GI prophylaxis: protonix Lines: Dialysis Catheter, TLC Foley:  N/A Code Status:  full code Last date of multidisciplinary goals of care discussion [patient updated 4/25]  Thalia Bloodgood DO  Internal Medicine Resident PGY-3 [place]  Pager: [phone]

## 2023-02-10 NOTE — Progress Notes (Signed)
Physical Therapy Treatment Patient Details Name: [name] MRN: [id number] DOB: 1966-06-08 Today's Date: 02/10/2023   History of Present Illness 57 y.o. female admitted 4/14 with c/o back and abdominal pain with N&V. 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 4/18 thoracic endovascular aortic repair. 4/23 SBO. PMH: T2DM, CKD, HIV, sarcoidosis    PT Comments    Pt pleasant and reports BM last night, walking with RN this am, and flatus during session. Pt with significant mobility improvement able to walk in hall with RW but denied further transfers, HEP or activity with return to bed due to fatigue. Plan remains appropriate and encouraged mobility for return home. Will continue to follow.   89-93%on 3L O2   Recommendations for follow up therapy are one component of a multi-disciplinary discharge planning process, led by the attending physician.  Recommendations may be updated based on patient status, additional functional criteria and insurance authorization.  Follow Up Recommendations       Assistance Recommended at Discharge Intermittent Supervision/Assistance  Patient can return home with the following A little help with walking and/or transfers;A little help with bathing/dressing/bathroom;Assistance with cooking/housework;Assist for transportation;Help with stairs or ramp for entrance   Equipment Recommendations  Rolling walker (2 wheels);BSC/3in1    Recommendations for Other Services       Precautions / Restrictions Precautions Precaution Comments: Cortrak, NGT, watch SpO2     Mobility  Bed Mobility   Bed Mobility: Supine to Sit, Sit to Supine     Supine to sit: HOB elevated, Min guard Sit to supine: Min guard   General bed mobility comments: HOB 15 degrees with pt able to pivot to EOB without assist and return to supine without need for physical assist, guarding for management of lines     Transfers Overall transfer level: Needs assistance   Transfers: Sit to/from Stand Sit to Stand: Supervision           General transfer comment: cues for hand placement and position at bed    Ambulation/Gait Ambulation/Gait assistance: Min guard Gait Distance (Feet): 150 Feet Assistive device: Rolling walker (2 wheels) Gait Pattern/deviations: Step-through pattern, Decreased stride length   Gait velocity interpretation: <1.8 ft/sec, indicate of risk for recurrent falls   General Gait Details: pt able to self regulate distance with reliance on RW, cues for breathing technique   Stairs             Wheelchair Mobility    Modified Rankin (Stroke Patients Only)       Balance Overall balance assessment: Needs assistance Sitting-balance support: No upper extremity supported, Feet supported Sitting balance-Leahy Scale: Good     Standing balance support: Bilateral upper extremity supported, During functional activity, Reliant on assistive device for balance Standing balance-Leahy Scale: Poor Standing balance comment: reliant on RW support in standing                            Cognition Arousal/Alertness: Awake/alert Behavior During Therapy: WFL for tasks assessed/performed Overall Cognitive Status: Within Functional Limits for tasks assessed                                          Exercises      General Comments        Pertinent Vitals/Pain  Pain Assessment Pain Score: 3  Pain Location: abdomen Pain Descriptors / Indicators: Sore Pain Intervention(s): Limited activity within patient's tolerance, Monitored during session, Premedicated before session, Repositioned    Home Living                          Prior Function            PT Goals (current goals can now be found in the care plan section) Progress towards PT goals: Progressing toward goals    Frequency    Min 1X/week      PT Plan Current  plan remains appropriate    Co-evaluation              AM-PAC PT "6 Clicks" Mobility   Outcome Measure  Help needed turning from your back to your side while in a flat bed without using bedrails?: None Help needed moving from lying on your back to sitting on the side of a flat bed without using bedrails?: A Little Help needed moving to and from a bed to a chair (including a wheelchair)?: A Little Help needed standing up from a chair using your arms (e.g., wheelchair or bedside chair)?: A Little Help needed to walk in hospital room?: A Little Help needed climbing 3-5 steps with a railing? : A Lot 6 Click Score: 18    End of Session   Activity Tolerance: Patient tolerated treatment well Patient left: in bed;with call bell/phone within reach Nurse Communication: Mobility status PT Visit Diagnosis: Difficulty in walking, not elsewhere classified (R26.2);Muscle weakness (generalized) (M62.81);Pain     Time: 0752-0818 PT Time Calculation (min) (ACUTE ONLY): 26 min  Charges:  $Gait Training: 8-22 mins $Therapeutic Activity: 8-22 mins                     Merryl Hacker, PT Acute Rehabilitation Services Office: [phone]    [name] 02/10/2023, 9:48 AM

## 2023-02-10 NOTE — Progress Notes (Signed)
[facility] Progress Note    Assessment/ Plan:   # CKD stage 5 with progression to ESRD -followed by our office as an outpatient.  Started HD due to uremic symptoms. S/p [facility] 4/15. IHD #1 4/16 - HD per MWF schedule for now  - CLIP underway for outpatient HD unit placement - She states that she is no longer interested in PD.  She would like to do in-center HD.  Will need AVF/AVG planning once stabilized -Follow daily labs, strict I/O  # Type B dissection -VVS following. S/p TEVAR 4/18 -BP control as below   # Encephalopathy - resolved and per charting improvement may correlate with HD initiation   # HTN   - goals per vascular, CCM.  She was on levo for a short time but is now off; then on again briefly with HD on 4/24  - optimize volume as tolerated with HD - she is now off of all anti-hypertensives.  Would first prioritize re-addition of beta blocker when need to add back an agent   # HIV - therapy per primary team   # DM2 -per primary service  # Urinary retention   - UA still not yet obtained and only intermittently makes urine.  No complaints today.  I have removed the order for now     # Hyponatremia - optimize with HD/UF  # Small bowel obstruction - per primary team   # Normocytic Anemia  - Will give one unit of PRBC's today   - iron deficiency - ferrlecit x 2 doses 4/24 - Gave aranesp 40 mcg once on 4/24.  Plan for 60 mcg weekly on Wednesdays going forward for now  # Hyperphosphatemia  - Improving with HD  - start binder once taking PO - she has an NG tube and a small bowel obstruction.    Disposition - Continue inpatient monitoring    Subjective:   She is still in the ICU.  Last HD on 4/24 with 50 mL UF (she was essentially kept even due to hypotension). She required levo during that HD treatment.  She had 300 mL UOP and one unmeasured urine output over 4/25 charted.  She had 600 mL output from NG over 4/25.  Vascular had vein mapping done  yesterday and per their note is planning for access prior to discharge.  note that her coreg was discontinued yesterday.  She has been on levo at 1 mcg/min as of this am and 3 liters of oxygen.   She and I discussed the risks/benefits/indications for PRBC's and she does consent to receive blood products.    Review of systems:    Denies chest pain  Denies shortness of breath  A little dizziness on standing  Abdominal pain is much better.  NG output as above.  She had a BM yesterday and overnight.  Walked around some yesterday and is about to walk around again today    Objective:   BP (!) 136/54   Pulse 73   Temp 98.4 F (36.9 C) (Oral)   Resp 20   Ht 5\' 5"  (1.651 m)   Wt 96.7 kg   LMP 03/31/2014 (LMP Unknown)   SpO2 98%   BMI 35.48 kg/m   Intake/Output Summary (Last 24 hours) at 02/10/2023 0606 Last data filed at 02/10/2023 0400 Gross per 24 hour  Intake 1568.58 ml  Output 900 ml  Net 668.58 ml   Weight change: 3.8 kg  Physical Exam:       General  adult female in bed in no acute distress HEENT normocephalic atraumatic extraocular movements intact sclera anicteric Neck supple trachea midline Lungs clear to auscultation bilaterally anteriorly unlabored at rest; on 3 liters oxygen Heart S1S2 no rub Abdomen soft non-tender to palpation; less distended. obese habitus Extremities no edema lower extremities  Psych no anxiety or agitation Neuro patient is alert and conversant; provides history and follows commands Access: RIJ tunn catheter in place    Imaging: VAS [place] UPPER EXT VEIN MAPPING (PRE-OP AVF)  Result Date: 02/09/2023 UPPER EXTREMITY VEIN MAPPING Patient Name:  [name] [facility]  Date of Exam:   02/09/2023 Medical Rec #: [id number]              Accession #:    [id number] Date of Birth: 1965/12/01              Patient Gender: F Patient Age:   57 years Exam Location:  [facility] Procedure:      VAS [place] UPPER EXT VEIN MAPPING (PRE-OP AVF) Referring Phys: Emilie Rutter --------------------------------------------------------------------------------  Indications: Pre-access. Comparison Study: No previous study. Performing Technologist: McKayla Maag RVT, [place]  Examination Guidelines: A complete evaluation includes B-mode imaging, spectral Doppler, color Doppler, and power Doppler as needed of all accessible portions of each vessel. Bilateral testing is considered an integral part of a complete examination. Limited examinations for reoccurring indications may be performed as noted. +-----------------+-------------+----------+--------------+ Right Cephalic   Diameter (cm)Depth (cm)   Findings    +-----------------+-------------+----------+--------------+ Shoulder             0.18        1.59                  +-----------------+-------------+----------+--------------+ Prox upper arm       0.27        1.17                  +-----------------+-------------+----------+--------------+ Mid upper arm        0.22        1.06                  +-----------------+-------------+----------+--------------+ Dist upper arm       0.34        0.96                  +-----------------+-------------+----------+--------------+ Antecubital fossa    0.22        0.71                  +-----------------+-------------+----------+--------------+ Prox forearm                            not visualized +-----------------+-------------+----------+--------------+ Mid forearm                             not visualized +-----------------+-------------+----------+--------------+ Dist forearm                            not visualized +-----------------+-------------+----------+--------------+ Wrist                                   not visualized +-----------------+-------------+----------+--------------+ +-----------------+-------------+----------+--------------+ Right Basilic    Diameter (cm)Depth (cm)   Findings     +-----------------+-------------+----------+--------------+ Shoulder  not visualized +-----------------+-------------+----------+--------------+ Prox upper arm       0.50        2.48   not visualized +-----------------+-------------+----------+--------------+ Mid upper arm        0.43        2.03                  +-----------------+-------------+----------+--------------+ Dist upper arm       0.28        1.94                  +-----------------+-------------+----------+--------------+ Antecubital fossa    0.27        0.93                  +-----------------+-------------+----------+--------------+ Prox forearm         0.23        0.30                  +-----------------+-------------+----------+--------------+ Mid forearm          0.19        0.27                  +-----------------+-------------+----------+--------------+ Distal forearm                             Thrombus    +-----------------+-------------+----------+--------------+ Wrist                                   not visualized +-----------------+-------------+----------+--------------+ Acute superficial thrombus is noted in the right basilic vein located at the distal forearm. +-----------------+-------------+----------+--------------+ Left Cephalic    Diameter (cm)Depth (cm)   Findings    +-----------------+-------------+----------+--------------+ Shoulder             0.26        1.99                  +-----------------+-------------+----------+--------------+ Prox upper arm       0.25        1.30                  +-----------------+-------------+----------+--------------+ Mid upper arm        0.32        0.91     branching    +-----------------+-------------+----------+--------------+ Dist upper arm                             Thrombus    +-----------------+-------------+----------+--------------+ Antecubital fossa                          Thrombus     +-----------------+-------------+----------+--------------+ Prox forearm                               Thrombus    +-----------------+-------------+----------+--------------+ Mid forearm                                Thrombus    +-----------------+-------------+----------+--------------+ Dist forearm         0.14        0.34                  +-----------------+-------------+----------+--------------+ Wrist  not visualized +-----------------+-------------+----------+--------------+ +-----------------+-------------+----------+--------------+ Left Basilic     Diameter (cm)Depth (cm)   Findings    +-----------------+-------------+----------+--------------+ Prox upper arm       0.26        2.12                  +-----------------+-------------+----------+--------------+ Mid upper arm        0.22        1.99                  +-----------------+-------------+----------+--------------+ Dist upper arm       0.20        1.11                  +-----------------+-------------+----------+--------------+ Antecubital fossa    0.19        0.64                  +-----------------+-------------+----------+--------------+ Prox forearm         0.17        0.44                  +-----------------+-------------+----------+--------------+ Mid forearm          0.13        0.47                  +-----------------+-------------+----------+--------------+ Distal forearm       0.07        0.24                  +-----------------+-------------+----------+--------------+ Wrist                                   not visualized +-----------------+-------------+----------+--------------+ Acute superficial thrombus is noted in the left cephalic vein located at the distal upper arm to mid forearm. *See table(s) above for measurements and observations.  Diagnosing physician:    Preliminary    DG Abd Portable 1V  Result Date: 02/09/2023 CLINICAL  DATA:  Ileus EXAM: PORTABLE ABDOMEN - 1 VIEW COMPARISON:  Radiographs dated February 08, 2019 FINDINGS: Persistent dilated small bowel loops measuring up to 5 point 1 cm concerning for ileus and/or obstruction, unchanged. Feeding tube coursing below the diaphragm with distal tip projecting over the proximal jejunum, unchanged. 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 No acute osseous abnormality. Right basilar lung opacity is also unchanged. IMPRESSION: 1. Persistent dilated small bowel loops concerning for ileus and/or obstruction, unchanged. 2. No significant interval change. Electronically Signed   By: Larose Hires D.O.   On: 02/09/2023 08:51   DG Abd Portable 1V  Result Date: 02/08/2023 CLINICAL DATA:  Feeding tube placement EXAM: PORTABLE ABDOMEN - 1 VIEW COMPARISON:  02/08/2023 at 6:24 a.m. FINDINGS: A feeding tube is present with pattern of curvature indicating tube tip at the ligament of Treitz/fourth portion of the duodenum. A right dialysis catheter is present with its tip in the lower part of the right atrium. Separate central line is present with tip at the cavoatrial junction. A nasogastric tube is present with a coil near its side-, and terminating in the stomach body. Continued airspace opacity obscuring the right lung base. Continued bandlike opacity the left lung base. Moderately dilated loops of upper abdominal small bowel are observed up to about 4.9 cm in diameter, nonspecific for obstruction versus ileus. Overlapping descending thoracic and upper abdominal aortic stents noted. IMPRESSION: 1. Feeding  tube tip is at the ligament of Treitz/fourth portion of the duodenum. 2. Moderately dilated loops of upper abdominal small bowel, nonspecific for obstruction versus ileus. 3. Bibasilar airspace opacities, right greater than left, unchanged. 4. Overlapping descending thoracic and upper abdominal aortic stents noted. Electronically Signed   By: Gaylyn Rong M.D.   On: 02/08/2023 16:16   DG  Chest Port 1 View  Result Date: 02/08/2023 CLINICAL DATA:  Dyspnea EXAM: PORTABLE CHEST 1 VIEW COMPARISON:  Chest x-ray 02/07/2023.  CT of the chest 02/07/2023 FINDINGS: Thoracic aortic graft/stent again seen. The heart is enlarged, unchanged. There is a small right pleural effusion, unchanged. There are patchy airspace opacities in the left lung base, unchanged. There is no pneumothorax or acute fracture identified. Right-sided central venous catheter tip projects over the right atrium which is similar to prior. There is a new left-sided central venous catheter with distal tip projecting over the distal SVC. Enteric tube extends below the diaphragm. IMPRESSION: 1. Stable cardiomegaly. 2. Stable small right pleural effusion. 3. Stable patchy airspace opacities in the left lung base. 4. New left-sided central venous catheter with distal tip projecting over the distal SVC. Electronically Signed   By: Darliss Cheney M.D.   On: 02/08/2023 14:12   DG Abd Portable 1V  Result Date: 02/08/2023 CLINICAL DATA:  Small-bowel obstruction EXAM: PORTABLE ABDOMEN - 1 VIEW COMPARISON:  Portable exam 0624 hours compared to 02/07/2023 FINDINGS: Prior aortic stenting. Persistent air-filled distention of small bowel loops throughout abdomen no a small amount of colonic gas is seen. Findings could represent small-bowel obstruction or ileus. No definite bowel wall thickening. Bibasilar atelectasis. Enlargement of cardiac silhouette. Central line projects over RIGHT atrium. IMPRESSION: Persistent mild small bowel dilatation though some colonic gas is seen; pattern could reflect ileus or small-bowel obstruction. Bibasilar atelectasis. Electronically Signed   By: Ulyses Southward M.D.   On: 02/08/2023 08:48    Labs: BMET Recent Labs  Lab 02/04/23 0309 02/05/23 0128 02/05/23 0129 02/06/23 0042 02/07/23 0034 02/08/23 0043 02/09/23 0355 02/10/23 0352  NA 129*  --  131* 128* 130* 127* 132* 130*  K 3.6  --  3.6 4.3 3.8 4.4 3.2*  3.2*  CL 94*  --  95* 93* 93* 88* 92* 91*  CO2 24  --  23 19* 25 22 28 25   GLUCOSE 169*  --  159* 113* 174* 85 139* 138*  BUN 39*  --  55* 71* 50* 72* 47* 63*  CREATININE 6.68*  --  7.83* 9.08* 6.71* 9.05* 7.18* 8.61*  CALCIUM 7.4*  --  8.0* 8.2* 8.4* 8.7* 8.2* 8.2*  PHOS 6.0* 7.0* 7.0* 8.8* 6.3* 8.4* 5.2* 4.8*   CBC Recent Labs  Lab 02/08/23 0043 02/09/23 0355 02/09/23 1301 02/10/23 0352  WBC 7.1 6.6 8.4 9.2  HGB 8.4* 7.1* 7.4* 7.1*  HCT 24.3* 21.4* 22.4* 21.3*  MCV 94.2 97.7 98.7 98.2  PLT 188 166 188 178    Medications:     aspirin  81 mg Per Tube Daily   bisacodyl  10 mg Rectal Q0600   Chlorhexidine Gluconate Cloth  6 each Topical Daily   Chlorhexidine Gluconate Cloth  6 each Topical Q0600   clotrimazole  10 mg Oral 5 X Daily   [START ON 02/15/2023] darbepoetin (ARANESP) injection - NON-DIALYSIS  60 mcg Subcutaneous Q Wed-1800   dolutegravir  50 mg Per Tube Daily   emtricitabine-tenofovir AF  1 tablet Per Tube Daily   insulin aspart  0-15 Units Subcutaneous Q4H  levothyroxine  175 mcg Per Tube Q0600   lidocaine  1 patch Transdermal Q24H   metoCLOPramide (REGLAN) injection  5 mg Intravenous Q12H   mouth rinse  15 mL Mouth Rinse 4 times per day   pantoprazole (PROTONIX) IV  40 mg Intravenous Q24H   rosuvastatin  10 mg Per Tube Daily   sertraline  50 mg Per Tube Daily     Estanislado Emms, MD 6:33 AM 02/10/2023

## 2023-02-10 NOTE — Progress Notes (Signed)
[facility] Progress Note  8 Days Post-Op  Subjective: CC:  states abd pain cont to improve. Passing flatus, having Bms now. On TPN  Objective: Vital signs in last 24 hours: Temp:  [98.1 F (36.7 C)-98.4 F (36.9 C)] 98.4 F (36.9 C) (04/25 2345) Pulse Rate:  [61-73] 68 (04/26 0700) Resp:  [12-37] 16 (04/26 0700) BP: (107-153)/(44-90) 119/46 (04/26 0700) SpO2:  [87 %-100 %] 100 % (04/26 0700) Weight:  [96.7 kg] 96.7 kg (04/26 0500) Last BM Date : 02/09/23  Intake/Output from previous day: 04/25 0701 - 04/26 0700 In: 1624 [I.V.:1467.4; IV Piggyback:156.6] Out: 900 [Urine:300; Emesis/NG output:600] Intake/Output this shift: No intake/output data recorded.  PE: Gen:  Alert, NAD, pleasant Card:  Regular rate and rhythm, pedal pulses 2+ BL Pulm:  Normal effort, clear to auscultation bilaterally Abd: Soft, minimally tender without guarding, mild distention and tympany,  incisions C/D/I  NGT with 600cc out over 24 hrs.  Cortrak in L nare, clamped. Skin: warm and dry, no rashes  Psych: A&Ox3   Lab Results:  Recent Labs    02/09/23 1301 02/10/23 0352  WBC 8.4 9.2  HGB 7.4* 7.1*  HCT 22.4* 21.3*  PLT 188 178   BMET Recent Labs    02/09/23 0355 02/10/23 0352  NA 132* 130*  K 3.2* 3.2*  CL 92* 91*  CO2 28 25  GLUCOSE 139* 138*  BUN 47* 63*  CREATININE 7.18* 8.61*  CALCIUM 8.2* 8.2*   PT/INR No results for input(s): "LABPROT", "INR" in the last 72 hours. CMP     Component Value Date/Time   NA 130 (L) 02/10/2023 0352   NA 142 12/05/2022 0000   K 3.2 (L) 02/10/2023 0352   CL 91 (L) 02/10/2023 0352   CO2 25 02/10/2023 0352   GLUCOSE 138 (H) 02/10/2023 0352   BUN 63 (H) 02/10/2023 0352   BUN 44 (A) 12/05/2022 0000   CREATININE 8.61 (H) 02/10/2023 0352   CREATININE 2.72 (H) 07/27/2021 1110   CREATININE 2.74 (H) 03/17/2021 1050   CALCIUM 8.2 (L) 02/10/2023 0352   PROT 6.5 02/10/2023 0352   ALBUMIN 2.2 (L) 02/10/2023 0352   AST 12 (L)  02/10/2023 0352   AST 28 07/27/2021 1110   ALT <5 02/10/2023 0352   ALT 19 07/27/2021 1110   ALKPHOS 59 02/10/2023 0352   BILITOT 0.5 02/10/2023 0352   BILITOT 0.3 07/27/2021 1110   GFRNONAA 5 (L) 02/10/2023 0352   GFRNONAA 20 (L) 07/27/2021 1110   GFRAA 39 (L) 07/14/2020 1053   GFRAA 33 (L) 08/12/2019 1447   Lipase     Component Value Date/Time   LIPASE 51 01/28/2023 2358       Studies/Results: VAS [place] UPPER EXT VEIN MAPPING (PRE-OP AVF)  Result Date: 02/09/2023 UPPER EXTREMITY VEIN MAPPING Patient Name:  [name] [facility]  Date of Exam:   02/09/2023 Medical Rec #: [id number]              Accession #:    [id number] Date of Birth: 06-13-1966              Patient Gender: F Patient Age:   57 years Exam Location:  [facility] Procedure:      VAS [place] UPPER EXT VEIN MAPPING (PRE-OP AVF) Referring Phys: Emilie Rutter --------------------------------------------------------------------------------  Indications: Pre-access. Comparison Study: No previous study. Performing Technologist: McKayla Maag RVT, [place]  Examination Guidelines: A complete evaluation includes B-mode imaging, spectral Doppler, color Doppler, and power Doppler as needed of all  accessible portions of each vessel. Bilateral testing is considered an integral part of a complete examination. Limited examinations for reoccurring indications may be performed as noted. +-----------------+-------------+----------+--------------+ Right Cephalic   Diameter (cm)Depth (cm)   Findings    +-----------------+-------------+----------+--------------+ Shoulder             0.18        1.59                  +-----------------+-------------+----------+--------------+ Prox upper arm       0.27        1.17                  +-----------------+-------------+----------+--------------+ Mid upper arm        0.22        1.06                  +-----------------+-------------+----------+--------------+ Dist upper arm       0.34         0.96                  +-----------------+-------------+----------+--------------+ Antecubital fossa    0.22        0.71                  +-----------------+-------------+----------+--------------+ Prox forearm                            not visualized +-----------------+-------------+----------+--------------+ Mid forearm                             not visualized +-----------------+-------------+----------+--------------+ Dist forearm                            not visualized +-----------------+-------------+----------+--------------+ Wrist                                   not visualized +-----------------+-------------+----------+--------------+ +-----------------+-------------+----------+--------------+ Right Basilic    Diameter (cm)Depth (cm)   Findings    +-----------------+-------------+----------+--------------+ Shoulder                                not visualized +-----------------+-------------+----------+--------------+ Prox upper arm       0.50        2.48   not visualized +-----------------+-------------+----------+--------------+ Mid upper arm        0.43        2.03                  +-----------------+-------------+----------+--------------+ Dist upper arm       0.28        1.94                  +-----------------+-------------+----------+--------------+ Antecubital fossa    0.27        0.93                  +-----------------+-------------+----------+--------------+ Prox forearm         0.23        0.30                  +-----------------+-------------+----------+--------------+ Mid forearm          0.19        0.27                  +-----------------+-------------+----------+--------------+  Distal forearm                             Thrombus    +-----------------+-------------+----------+--------------+ Wrist                                   not visualized +-----------------+-------------+----------+--------------+ Acute  superficial thrombus is noted in the right basilic vein located at the distal forearm. +-----------------+-------------+----------+--------------+ Left Cephalic    Diameter (cm)Depth (cm)   Findings    +-----------------+-------------+----------+--------------+ Shoulder             0.26        1.99                  +-----------------+-------------+----------+--------------+ Prox upper arm       0.25        1.30                  +-----------------+-------------+----------+--------------+ Mid upper arm        0.32        0.91     branching    +-----------------+-------------+----------+--------------+ Dist upper arm                             Thrombus    +-----------------+-------------+----------+--------------+ Antecubital fossa                          Thrombus    +-----------------+-------------+----------+--------------+ Prox forearm                               Thrombus    +-----------------+-------------+----------+--------------+ Mid forearm                                Thrombus    +-----------------+-------------+----------+--------------+ Dist forearm         0.14        0.34                  +-----------------+-------------+----------+--------------+ Wrist                                   not visualized +-----------------+-------------+----------+--------------+ +-----------------+-------------+----------+--------------+ Left Basilic     Diameter (cm)Depth (cm)   Findings    +-----------------+-------------+----------+--------------+ Prox upper arm       0.26        2.12                  +-----------------+-------------+----------+--------------+ Mid upper arm        0.22        1.99                  +-----------------+-------------+----------+--------------+ Dist upper arm       0.20        1.11                  +-----------------+-------------+----------+--------------+ Antecubital fossa    0.19        0.64                   +-----------------+-------------+----------+--------------+ Prox forearm         0.17  0.44                  +-----------------+-------------+----------+--------------+ Mid forearm          0.13        0.47                  +-----------------+-------------+----------+--------------+ Distal forearm       0.07        0.24                  +-----------------+-------------+----------+--------------+ Wrist                                   not visualized +-----------------+-------------+----------+--------------+ Acute superficial thrombus is noted in the left cephalic vein located at the distal upper arm to mid forearm. *See table(s) above for measurements and observations.  Diagnosing physician:    Preliminary    DG Abd Portable 1V  Result Date: 02/09/2023 CLINICAL DATA:  Ileus EXAM: PORTABLE ABDOMEN - 1 VIEW COMPARISON:  Radiographs dated February 08, 2019 FINDINGS: Persistent dilated small bowel loops measuring up to 5 point 1 cm concerning for ileus and/or obstruction, unchanged. Feeding tube coursing below the diaphragm with distal tip projecting over the proximal jejunum, unchanged. 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 No acute osseous abnormality. Right basilar lung opacity is also unchanged. IMPRESSION: 1. Persistent dilated small bowel loops concerning for ileus and/or obstruction, unchanged. 2. No significant interval change. Electronically Signed   By: Larose Hires D.O.   On: 02/09/2023 08:51   DG Abd Portable 1V  Result Date: 02/08/2023 CLINICAL DATA:  Feeding tube placement EXAM: PORTABLE ABDOMEN - 1 VIEW COMPARISON:  02/08/2023 at 6:24 a.m. FINDINGS: A feeding tube is present with pattern of curvature indicating tube tip at the ligament of Treitz/fourth portion of the duodenum. A right dialysis catheter is present with its tip in the lower part of the right atrium. Separate central line is present with tip at the cavoatrial junction. A nasogastric tube is present with  a coil near its side-, and terminating in the stomach body. Continued airspace opacity obscuring the right lung base. Continued bandlike opacity the left lung base. Moderately dilated loops of upper abdominal small bowel are observed up to about 4.9 cm in diameter, nonspecific for obstruction versus ileus. Overlapping descending thoracic and upper abdominal aortic stents noted. IMPRESSION: 1. Feeding tube tip is at the ligament of Treitz/fourth portion of the duodenum. 2. Moderately dilated loops of upper abdominal small bowel, nonspecific for obstruction versus ileus. 3. Bibasilar airspace opacities, right greater than left, unchanged. 4. Overlapping descending thoracic and upper abdominal aortic stents noted. Electronically Signed   By: Gaylyn Rong M.D.   On: 02/08/2023 16:16   DG Chest Port 1 View  Result Date: 02/08/2023 CLINICAL DATA:  Dyspnea EXAM: PORTABLE CHEST 1 VIEW COMPARISON:  Chest x-ray 02/07/2023.  CT of the chest 02/07/2023 FINDINGS: Thoracic aortic graft/stent again seen. The heart is enlarged, unchanged. There is a small right pleural effusion, unchanged. There are patchy airspace opacities in the left lung base, unchanged. There is no pneumothorax or acute fracture identified. Right-sided central venous catheter tip projects over the right atrium which is similar to prior. There is a new left-sided central venous catheter with distal tip projecting over the distal SVC. Enteric tube extends below the diaphragm. IMPRESSION: 1. Stable cardiomegaly. 2. Stable small right pleural effusion. 3. Stable patchy airspace  opacities in the left lung base. 4. New left-sided central venous catheter with distal tip projecting over the distal SVC. Electronically Signed   By: Darliss Cheney M.D.   On: 02/08/2023 14:12    Anti-infectives: Anti-infectives (From admission, onward)    Start     Dose/Rate Route Frequency Ordered Stop   02/02/23 1915  ceFAZolin (ANCEF) IVPB 2g/100 mL premix        2  g 200 mL/hr over 30 Minutes Intravenous Every 8 hours 02/02/23 1820 02/03/23 0337   02/02/23 1452  ceFAZolin (ANCEF) 2-4 GM/100ML-% IVPB       Note to Pharmacy: Payton Emerald A: cabinet override      02/02/23 1452 02/03/23 0259   02/02/23 1450  ceFAZolin (ANCEF) 3-0.9 GM/100ML-% IVPB  Status:  Discontinued       Note to Pharmacy: Payton Emerald A: cabinet override      02/02/23 1450 02/02/23 1453   02/02/23 1000  dolutegravir ([name]) tablet 50 mg        50 mg Per Tube Daily 02/01/23 2052     02/02/23 1000  emtricitabine-tenofovir AF (DESCOVY) 200-25 MG per tablet 1 tablet        1 tablet Per Tube Daily 02/01/23 2052     01/30/23 1531  ceFAZolin (ANCEF) IVPB 2g/100 mL premix        over 30 Minutes Intravenous Continuous PRN 01/30/23 1537 01/30/23 1531   01/29/23 1115  dolutegravir ([name]) tablet 50 mg  Status:  Discontinued        50 mg Oral Daily 01/29/23 1021 02/01/23 2052   01/29/23 1115  emtricitabine-tenofovir AF (DESCOVY) 200-25 MG per tablet 1 tablet  Status:  Discontinued        1 tablet Oral Daily 01/29/23 1021 02/01/23 2052        Assessment/Plan  SBO vs ileus 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 Karin Lieu - CTA 4/23 shows interval placement of an aortic stent graft without worrisome complicating features, as well as dilated loops of small bowel with transition noted in the distal ileum concerning for SBO.  - WBC remains WNL and she is hemodynamically stable - Given history seems more likely to be an ileus. If she has a distal SBO then it is a partial SBO because contrast from CT made it to her colon.   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  This care required moderate level of medical decision making.   Marin Olp, MD [facility]

## 2023-02-10 NOTE — Progress Notes (Signed)
Met with pt and pt's sister at bedside. Discussed out-pt HD placement. Pt prefers [facility] if possible. Referral submitted to [facility] admissions today for review. Will assist as needed.   Olivia Canter Renal Navigator [phone]

## 2023-02-10 NOTE — Progress Notes (Signed)
Nutrition Follow-up  DOCUMENTATION CODES:   Non-severe (moderate) malnutrition in context of acute illness/injury  INTERVENTION:   Recommend NG to LIS post initiation of TF as gastric secretions will increase post initiation and decompression during feeding will hopefully reduced risk of vomiting, aspiration. Discussed with Dr. Denese Killings and plan to place NG to gravity for now. No plans to clamp and check residuals  Tube Feeding via Cortrak (Post-Pyloric): Vital 1.5 at 20 ml/hr Goal TF:  Vital 1.5 at 50 ml/hr with Pro-Source TF20 60 mL daily This provides 1880 kcals, 101 g of protein and 912 mL of free water  Continue TPN to meet nutritional needs until pt demonstrating TF tolerance at goal rate  Resume renal MVI once off TPN  NUTRITION DIAGNOSIS:   Moderate Malnutrition related to acute illness as evidenced by energy intake < or equal to 50% for > or equal to 5 days, mild muscle depletion, moderate muscle depletion.  Being addressed via nutrition support  GOAL:   Patient will meet greater than or equal to 90% of their needs  Progressing  MONITOR:   Diet advancement, TF tolerance, Labs, Weight trends  REASON FOR ASSESSMENT:   Rounds    ASSESSMENT:   57 yo female admitted with Type B aortic dissection, AKI on CKD 4-progressed to ESRD and iHD initiated. PMH DM, CKD 4, HIV, sarcoidosis, HTN, HLD  4/15 TDC placed 4/16 1st iHD 4/17 2nd iHD, Cortrak placed, trickle TF 4/18 OR: thoracic, mesenteric, infrarenal abdominal angiograms, thoracic endovascular aortic repair 4/19 3rd iHD, TF titrated to 40 ml/hr 4/21 Abd xray with non specific distention of SB and colon, NPO 4/23 CT angio C/A/P with dilated small bowel loops with air-fluid levels with a transition to decompressed/normal distal ileal loops of small bowel suggesting small bowel obstruction likely related to adhesions. Mild distention of the ascending colon but the rest of the colon is largely decompressed and  contains some contrast 4/24 TPN initiated 4/25 Abd xray with persistent distention of SB 4/26 Abd xray with stable dilation of SB loops, Cortrak distal duodenum    Abdominal xray this AM with "Stable dilation of small bowel loops compatible with small bowel obstruction" Cortrak in distal duodenum, NG in stomach.   +smear BM yesterday on day shift, noted +BM on night shift but unsure if it was just a smear as well or something more substantial. NG with 600 mL. Also noted nursing documentation of "vomiting mucous" overnight. +flatus  Surgery and PCCM requesting trial of trickle TF via Cortrak today. Pt continues to be at high risk for TF intolerance. Recommend utilizing NG tube for gastric decompression post initiation of trickle TF today via post pyloric Cortrak  TPN increasing to goal rate this evening  Started on diflucan today for thrush  Phosphorus trending down, potassium remains low (being supplemented). High risk for refeeding syndrome given prolonged period of inadequate nutrition, TPN Pharmacist aware and monitoring closely  Weight up 3-4 kg since iHD on 4/24; noted iHD again today.   Labs: phosphorus 4.8 (H),  potassium 3.2 (L), sodium 130, CBGs 130-190 Meds:  reglan, ss novolog, mag sulfate, KCl, colace, aranesp, dulcolax suppository   Diet Order:   Diet Order             Diet NPO time specified Except for: Sips with Meds  Diet effective now                   EDUCATION NEEDS:   Education needs have been addressed  Skin:  Skin Assessment: Reviewed RN Assessment  Last BM:  4/25  Height:   Ht Readings from Last 1 Encounters:  01/28/23 5\' 5"  (1.651 m)    Weight:   Wt Readings from Last 1 Encounters:  02/10/23 96.7 kg    BMI:  Body mass index is 35.48 kg/m.  Estimated Nutritional Needs:   Kcal:  1750-1950 kcals  Protein:  85-105 g  Fluid:  1L plus UOP   Romelle Starcher MS, RDN, LDN, CNSC Registered Dietitian 3 Clinical Nutrition RD Pager and  On-Call Pager Number Located in [place]

## 2023-02-10 NOTE — Progress Notes (Addendum)
eLink Physician-Brief Progress Note Patient Name: [name] DOB: Aug 23, 1966 MRN: [id number]   Date of Service  02/10/2023  HPI/Events of Note  K 3.2,  mg 1.7  eICU Interventions  Will hold on replacement.  Patient to have dialysis today     Intervention Category Minor Interventions: Electrolytes abnormality - evaluation and management  Henry Russel, P 02/10/2023, 5:25 AM

## 2023-02-11 DIAGNOSIS — I7103 Dissection of thoracoabdominal aorta: Secondary | ICD-10-CM | POA: Diagnosis not present

## 2023-02-11 LAB — GLUCOSE, CAPILLARY
Glucose-Capillary: 165 mg/dL — ABNORMAL HIGH (ref 70–99)
Glucose-Capillary: 169 mg/dL — ABNORMAL HIGH (ref 70–99)
Glucose-Capillary: 169 mg/dL — ABNORMAL HIGH (ref 70–99)
Glucose-Capillary: 183 mg/dL — ABNORMAL HIGH (ref 70–99)
Glucose-Capillary: 195 mg/dL — ABNORMAL HIGH (ref 70–99)
Glucose-Capillary: 228 mg/dL — ABNORMAL HIGH (ref 70–99)

## 2023-02-11 LAB — MAGNESIUM: Magnesium: 1.7 mg/dL (ref 1.7–2.4)

## 2023-02-11 LAB — BASIC METABOLIC PANEL
Anion gap: 9 (ref 5–15)
BUN: 47 mg/dL — ABNORMAL HIGH (ref 6–20)
CO2: 26 mmol/L (ref 22–32)
Calcium: 7.8 mg/dL — ABNORMAL LOW (ref 8.9–10.3)
Chloride: 96 mmol/L — ABNORMAL LOW (ref 98–111)
Creatinine, Ser: 6.03 mg/dL — ABNORMAL HIGH (ref 0.44–1.00)
GFR, Estimated: 8 mL/min — ABNORMAL LOW (ref >60)
Glucose, Bld: 202 mg/dL — ABNORMAL HIGH (ref 70–99)
Potassium: 3 mmol/L — ABNORMAL LOW (ref 3.5–5.1)
Sodium: 131 mmol/L — ABNORMAL LOW (ref 135–145)

## 2023-02-11 LAB — CBC
HCT: 23.4 % — ABNORMAL LOW (ref 36.0–46.0)
Hemoglobin: 7.6 g/dL — ABNORMAL LOW (ref 12.0–15.0)
MCH: 32.2 pg (ref 26.0–34.0)
MCHC: 32.5 g/dL (ref 30.0–36.0)
MCV: 99.2 fL (ref 80.0–100.0)
Platelets: 200 10*3/uL (ref 150–400)
RBC: 2.36 MIL/uL — ABNORMAL LOW (ref 3.87–5.11)
RDW: 16.1 % — ABNORMAL HIGH (ref 11.5–15.5)
WBC: 12.9 10*3/uL — ABNORMAL HIGH (ref 4.0–10.5)
nRBC: 0 % (ref 0.0–0.2)

## 2023-02-11 LAB — BPAM RBC

## 2023-02-11 LAB — TYPE AND SCREEN
ABO/RH(D): O POS
Antibody Screen: NEGATIVE
Unit division: 0

## 2023-02-11 MED ORDER — POTASSIUM CHLORIDE 10 MEQ/50ML IV SOLN: 10.0000 meq | INTRAVENOUS | Status: DC

## 2023-02-11 MED ORDER — VITAL 1.5 CAL PO LIQD
1000.0000 mL | ORAL | Status: DC
  Filled 2023-02-11 (×2): qty 1000

## 2023-02-11 MED ORDER — HEPARIN SODIUM (PORCINE) 5000 UNIT/ML IJ SOLN
5000.0000 [IU] | Freq: Two times a day (BID) | INTRAMUSCULAR | Status: DC
  Administered 2023-02-11 – 2023-02-21 (×18): 5000 [IU] via SUBCUTANEOUS
  Filled 2023-02-11 (×20): qty 1

## 2023-02-11 MED ORDER — POTASSIUM CHLORIDE 10 MEQ/50ML IV SOLN
10.0000 meq | INTRAVENOUS | Status: DC
  Administered 2023-02-11 (×4): 10 meq via INTRAVENOUS
  Filled 2023-02-11 (×4): qty 50

## 2023-02-11 MED ORDER — TRAVASOL 10 % IV SOLN
INTRAVENOUS | Status: AC
  Filled 2023-02-11: qty 428.4

## 2023-02-11 MED ORDER — MAGNESIUM SULFATE IN D5W 1-5 GM/100ML-% IV SOLN
1.0000 g | Freq: Once | INTRAVENOUS | Status: AC
  Administered 2023-02-11: 1 g via INTRAVENOUS
  Filled 2023-02-11: qty 100

## 2023-02-11 MED ORDER — CARVEDILOL 3.125 MG PO TABS
3.1250 mg | ORAL_TABLET | Freq: Two times a day (BID) | ORAL | Status: DC
  Administered 2023-02-11: 3.125 mg via ORAL
  Filled 2023-02-11: qty 1

## 2023-02-11 MED ORDER — POTASSIUM CHLORIDE 20 MEQ PO PACK
20.0000 meq | PACK | Freq: Once | ORAL | Status: AC
  Administered 2023-02-11: 20 meq via ORAL
  Filled 2023-02-11: qty 1

## 2023-02-11 NOTE — Progress Notes (Signed)
OT Cancellation Note  Patient Details Name: [name] MRN: [id number] DOB: 1966/01/30   Cancelled Treatment:    Reason Eval/Treat Not Completed: Fatigue/lethargy limiting ability to participate- pt fatigued from not sleeping overnight, asking OT to return back this afternoon as able.   Barry Brunner, OT Acute Rehabilitation Services Office [phone]   Chancy Milroy 02/11/2023, 8:33 AM

## 2023-02-11 NOTE — Progress Notes (Signed)
[facility] [place] PROGRESS NOTE  ASSESSMENT / PLAN: [name] is a 57 y.o. female s/p TEVAR for TBAD with malperfusion 02/02/23. Good perfusion globally; no evidence of lower extremity weakness. Seems to be globally improving.   SUBJECTIVE: No complaints. Bowel function improving.  OBJECTIVE: BP (!) 126/56   Pulse 69   Temp 97.8 F (36.6 C)   Resp (!) 23   Ht 5\' 5"  (1.651 m)   Wt 95.5 kg   LMP 03/31/2014 (LMP Unknown)   SpO2 97%   BMI 35.04 kg/m   Intake/Output Summary (Last 24 hours) at 02/11/2023 0738 Last data filed at 02/11/2023 0700 Gross per 24 hour  Intake 2572.09 ml  Output 1051 ml  Net 1521.09 ml    Well appearing woman in no distress Regular rate and rhythm Unlabored breathing Soft abdomen 2+ DP pulses bilaterally     Latest Ref Rng & Units 02/11/2023    4:16 AM 02/10/2023    4:15 PM 02/10/2023    3:52 AM  CBC  WBC 4.0 - 10.5 K/uL 12.9   9.2   Hemoglobin 12.0 - 15.0 g/dL 7.6  8.0  7.1   Hematocrit 36.0 - 46.0 % 23.4  23.3  21.3   Platelets 150 - 400 K/uL 200   178         Latest Ref Rng & Units 02/11/2023    4:16 AM 02/10/2023    3:52 AM 02/09/2023    3:55 AM  CMP  Glucose 70 - 99 mg/dL 562  130  865   BUN 6 - 20 mg/dL 47  63  47   Creatinine 0.44 - 1.00 mg/dL 7.84  6.96  2.95   Sodium 135 - 145 mmol/L 131  130  132   Potassium 3.5 - 5.1 mmol/L 3.0  3.2  3.2   Chloride 98 - 111 mmol/L 96  91  92   CO2 22 - 32 mmol/L 26  25  28    Calcium 8.9 - 10.3 mg/dL 7.8  8.2  8.2   Total Protein 6.5 - 8.1 g/dL  6.5  6.5   Total Bilirubin 0.3 - 1.2 mg/dL  0.5  0.7   Alkaline Phos 38 - 126 U/L  59  62   AST 15 - 41 U/L  12  13   ALT 0 - 44 U/L  <5  8     Estimated Creatinine Clearance: 12.2 mL/min (A) (by C-G formula based on SCr of 6.03 mg/dL (H)).  Rande Brunt. Lenell Antu, MD [facility] Phone Number: [phone] 02/11/2023 7:38 AM

## 2023-02-11 NOTE — Progress Notes (Signed)
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  Started HD due to uremic symptoms. S/p [facility] 4/15. IHD #1 4/16 - HD per MWF schedule for now.  Will increase UF with next HD - CLIP underway for outpatient HD unit placement - She states that she is no longer interested in PD.  She would like to do in-center HD.  Will need AVF/AVG planning once stabilized and per vascular note they are planning to place prior to discharge -Follow daily labs, strict I/O  # Type B dissection -VVS following. S/p TEVAR 4/18 -BP control as below   # Encephalopathy - resolved and per charting improvement may correlate with HD initiation   # HTN   - goals per vascular, CCM.  She was on levo for a short time but is now off; then on again briefly with HD on 4/24  - optimize volume as tolerated with HD - she is now off of all anti-hypertensives.  Would first prioritize re-addition of beta blocker when need to add back an agent     # HIV - therapy per primary team   # DM2 -per primary service  # Urinary retention   - UA still not yet obtained and only intermittently makes urine.  No complaints.  I have removed the order for now     # Hyponatremia - optimize with HD/UF  # Small bowel obstruction - per primary team   # Normocytic Anemia  - s/p PRBC's on 4/26  - iron deficiency - ferrlecit x 2 doses  - Gave aranesp 40 mcg once on 4/24.  Plan for 60 mcg weekly on Wednesdays going forward for now.  Titrate as needed  # Hypokalemia  - Replete with potassium 20 meq packet per tube   # Hyperphosphatemia  - Improving with HD  - start binder once taking PO - she has an NG tube and a small bowel obstruction.    Disposition - Continue inpatient monitoring    Subjective:   She is still in the ICU.  Last HD on 4/26 with 1 liter UF.  No urine output is charted over 4/26.  She got a unit of PRBC's  yesterday.  Per nursing she is able to get meds through [facility].  She states that she had "4 or 5 blowouts" yesterday.  She thinks HD went fine.  HD SW submitted referral for outpatient HD unit per charting.   Review of systems:    Denies chest pain  Denies shortness of breath  Abdominal pain is much better - about a 4/10  No n/v.   She has been in the chair for the last few minutes - slept in bed overnight.   Objective:   BP (!) 139/55 (BP Location: Right Arm)   Pulse 72   Temp 97.8 F (36.6 C)   Resp (!) 23   Ht 5\' 5"  (1.651 m)   Wt 95.5 kg   LMP 03/31/2014 (LMP Unknown)   SpO2 96%   BMI 35.04 kg/m   Intake/Output Summary (Last 24 hours) at 02/11/2023 1610 Last data filed at 02/11/2023 9604 Gross per 24 hour  Intake 2538.08 ml  Output 1051 ml  Net 1487.08 ml   Weight change: 0 kg  Physical Exam:       General adult female in bed in no acute distress HEENT normocephalic atraumatic extraocular movements intact sclera  anicteric Neck supple trachea midline Lungs clear to auscultation bilaterally anteriorly unlabored at rest; on 4 liters oxygen Heart S1S2 no rub Abdomen soft non-tender to palpation; less distended. obese habitus Extremities no edema lower extremities  Psych no anxiety or agitation Neuro patient is alert and conversant; provides history and follows commands Access: RIJ tunn catheter in place    Imaging: VAS [place] UPPER EXT VEIN MAPPING (PRE-OP AVF)  Result Date: 02/10/2023 UPPER EXTREMITY VEIN MAPPING Patient Name:  [name]  Date of Exam:   02/09/2023 Medical Rec #: [id number]              Accession #:    [id number] Date of Birth: 06-Jul-1966              Patient Gender: F Patient Age:   57 years Exam Location:  [facility] Procedure:      VAS [place] UPPER EXT VEIN MAPPING (PRE-OP AVF) Referring Phys: Emilie Rutter --------------------------------------------------------------------------------  Indications: Pre-access. Comparison Study: No  previous study. Performing Technologist: McKayla Maag RVT, [place]  Examination Guidelines: A complete evaluation includes B-mode imaging, spectral Doppler, color Doppler, and power Doppler as needed of all accessible portions of each vessel. Bilateral testing is considered an integral part of a complete examination. Limited examinations for reoccurring indications may be performed as noted. +-----------------+-------------+----------+--------------+ Right Cephalic   Diameter (cm)Depth (cm)   Findings    +-----------------+-------------+----------+--------------+ Shoulder             0.18        1.59                  +-----------------+-------------+----------+--------------+ Prox upper arm       0.27        1.17                  +-----------------+-------------+----------+--------------+ Mid upper arm        0.22        1.06                  +-----------------+-------------+----------+--------------+ Dist upper arm       0.34        0.96                  +-----------------+-------------+----------+--------------+ Antecubital fossa    0.22        0.71                  +-----------------+-------------+----------+--------------+ Prox forearm                            not visualized +-----------------+-------------+----------+--------------+ Mid forearm                             not visualized +-----------------+-------------+----------+--------------+ Dist forearm                            not visualized +-----------------+-------------+----------+--------------+ Wrist                                   not visualized +-----------------+-------------+----------+--------------+ +-----------------+-------------+----------+--------------+ Right Basilic    Diameter (cm)Depth (cm)   Findings    +-----------------+-------------+----------+--------------+ Shoulder  not visualized +-----------------+-------------+----------+--------------+  Prox upper arm       0.50        2.48   not visualized +-----------------+-------------+----------+--------------+ Mid upper arm        0.43        2.03                  +-----------------+-------------+----------+--------------+ Dist upper arm       0.28        1.94                  +-----------------+-------------+----------+--------------+ Antecubital fossa    0.27        0.93                  +-----------------+-------------+----------+--------------+ Prox forearm         0.23        0.30                  +-----------------+-------------+----------+--------------+ Mid forearm          0.19        0.27                  +-----------------+-------------+----------+--------------+ Distal forearm                             Thrombus    +-----------------+-------------+----------+--------------+ Wrist                                   not visualized +-----------------+-------------+----------+--------------+ Acute superficial thrombus is noted in the right basilic vein located at the distal forearm. +-----------------+-------------+----------+--------------+ Left Cephalic    Diameter (cm)Depth (cm)   Findings    +-----------------+-------------+----------+--------------+ Shoulder             0.26        1.99                  +-----------------+-------------+----------+--------------+ Prox upper arm       0.25        1.30                  +-----------------+-------------+----------+--------------+ Mid upper arm        0.32        0.91     branching    +-----------------+-------------+----------+--------------+ Dist upper arm                             Thrombus    +-----------------+-------------+----------+--------------+ Antecubital fossa                          Thrombus    +-----------------+-------------+----------+--------------+ Prox forearm                               Thrombus     +-----------------+-------------+----------+--------------+ Mid forearm                                Thrombus    +-----------------+-------------+----------+--------------+ Dist forearm         0.14        0.34                  +-----------------+-------------+----------+--------------+ Wrist  not visualized +-----------------+-------------+----------+--------------+ +-----------------+-------------+----------+--------------+ Left Basilic     Diameter (cm)Depth (cm)   Findings    +-----------------+-------------+----------+--------------+ Prox upper arm       0.26        2.12                  +-----------------+-------------+----------+--------------+ Mid upper arm        0.22        1.99                  +-----------------+-------------+----------+--------------+ Dist upper arm       0.20        1.11                  +-----------------+-------------+----------+--------------+ Antecubital fossa    0.19        0.64                  +-----------------+-------------+----------+--------------+ Prox forearm         0.17        0.44                  +-----------------+-------------+----------+--------------+ Mid forearm          0.13        0.47                  +-----------------+-------------+----------+--------------+ Distal forearm       0.07        0.24                  +-----------------+-------------+----------+--------------+ Wrist                                   not visualized +-----------------+-------------+----------+--------------+ Acute superficial thrombus is noted in the left cephalic vein located at the distal upper arm to mid forearm. *See table(s) above for measurements and observations.  Diagnosing physician: Gerarda Fraction Electronically signed by Gerarda Fraction on 02/10/2023 at 4:32:53 PM.    Final    DG Abd Portable 1V  Result Date: 02/10/2023 CLINICAL DATA:  Small bowel obstruction. EXAM: PORTABLE  ABDOMEN - 1 VIEW COMPARISON:  One-view abdomen 02/09/2023 FINDINGS: A small were feeding tube terminates in the distal duodenum. NG tube is coiled in the stomach. Gas-filled loops of small bowel demonstrate similar dilation to the prior exam. No free air is present. Aortic stent graft is in place. Tip of the dialysis catheter is again seen the inferior cavoatrial junction. IMPRESSION: 1. Stable dilation of small bowel loops compatible with small bowel obstruction. 2. Support apparatus as above. 3. No significant interval change. Electronically Signed   By: Marin Roberts M.D.   On: 02/10/2023 08:21    Labs: BMET Recent Labs  Lab 02/05/23 0128 02/05/23 0129 02/06/23 0042 02/07/23 0034 02/08/23 0043 02/09/23 0355 02/10/23 0352 02/11/23 0416  NA  --  131* 128* 130* 127* 132* 130* 131*  K  --  3.6 4.3 3.8 4.4 3.2* 3.2* 3.0*  CL  --  95* 93* 93* 88* 92* 91* 96*  CO2  --  23 19* 25 22 28 25 26   GLUCOSE  --  159* 113* 174* 85 139* 138* 202*  BUN  --  55* 71* 50* 72* 47* 63* 47*  CREATININE  --  7.83* 9.08* 6.71* 9.05* 7.18* 8.61* 6.03*  CALCIUM  --  8.0* 8.2* 8.4* 8.7* 8.2* 8.2* 7.8*  PHOS 7.0* 7.0* 8.8* 6.3*  8.4* 5.2* 4.8*  --    CBC Recent Labs  Lab 02/09/23 0355 02/09/23 1301 02/10/23 0352 02/10/23 1615 02/11/23 0416  WBC 6.6 8.4 9.2  --  12.9*  HGB 7.1* 7.4* 7.1* 8.0* 7.6*  HCT 21.4* 22.4* 21.3* 23.3* 23.4*  MCV 97.7 98.7 98.2  --  99.2  PLT 166 188 178  --  200    Medications:     aspirin  81 mg Per Tube Daily   bisacodyl  10 mg Rectal Q0600   Chlorhexidine Gluconate Cloth  6 each Topical Daily   Chlorhexidine Gluconate Cloth  6 each Topical Q0600   [START ON 02/15/2023] darbepoetin (ARANESP) injection - NON-DIALYSIS  60 mcg Subcutaneous Q Wed-1800   dolutegravir  50 mg Per Tube Daily   emtricitabine-tenofovir AF  1 tablet Per Tube Daily   feeding supplement (PROSource TF20)  60 mL Per Tube Daily   fluconazole  100 mg Per Tube Daily   insulin aspart  0-15 Units  Subcutaneous Q4H   levothyroxine  175 mcg Per Tube Q0600   lidocaine  1 patch Transdermal Q24H   metoCLOPramide (REGLAN) injection  5 mg Intravenous Q12H   nystatin  5 mL Oral QID   mouth rinse  15 mL Mouth Rinse 4 times per day   pantoprazole (PROTONIX) IV  40 mg Intravenous Q24H   rosuvastatin  10 mg Per Tube Daily   sertraline  50 mg Per Tube Daily     Estanislado Emms, MD 6:51 AM 02/11/2023

## 2023-02-11 NOTE — Progress Notes (Signed)
PHARMACY - TOTAL PARENTERAL NUTRITION CONSULT NOTE   Indication: Prolonged ileus   Patient Measurements: Height: 5\' 5"  (165.1 cm) Weight: 95.5 kg (210 lb 8.6 oz) IBW/kg (Calculated) : 57 TPN AdjBW (KG): 66.8 Body mass index is 35.04 kg/m. Usual Weight: ~99 kg  Assessment:  57 yo F initially presented with type B aortic dissection now s/p TEVAR (4/18). Pt now with ongoing abdominal pain and dilated bowel loops on imaging c/f ileus. Trickle tube feeding attempted 4/22. Pt with ~5L of NG output 4/23. iHD on MWF schedule   Glucose / Insulin: CBG 183 - 212 - received 17u SSI in last 24h  Electrolytes: Na 131, K 3.0, Cl 96, CO2 26, CoCa 9.6, Mag 1.7  Renal: Hemodialysis (MWF schedule) - last 4/24; Phos 4.8 Hepatic: LFTs WNL, Alb 2.2, Trigs 147 (4/25) Intake / Output; MIVF: NG output = 50 mL; removed with iHD LBM 4/27. GI Imaging:  4/24: KUB: ileus vs SBO 4/25: KUB: persistent, unchanged from 4/24 4/26: KUB: stable, no significant change  GI Surgeries / Procedures: n/a  Central access: CVC (4/24) TPN start date: 4/24   Nutritional Goals: Goal TPN rate is 70 mL/hr (provides 85 g of protein and 1880 kcals per day)  RD Assessment: Estimated Needs Total Energy Estimated Needs: 1750-1950 kcals Total Protein Estimated Needs: 85-105 g Total Fluid Estimated Needs: 1L plus UOP  Current Nutrition:  NPO > potential start trickle feeds 4/26   Plan:  Decrease TPN to 35 mL/hr at 1800 - to provide 50% of nutritional goals Electrolytes in TPN: Na 56 mEq/L, Increase K 10 mEq/L, Ca 2 mEq/L, Increase Mg 7 mEq/L, and Phos 71mmol/L. Maximize chloride -Additional KCL IV x 4 this AM  - Mag 1 gm IV x 1 -f/u tolerance of enteral feeds to TPN weaning - advancing to goal today Add standard MVI and trace elements, remove chromium in TPN Continue Moderate q4h SSI and adjust as needed  Monitor TPN labs on Mon/Thurs, daily until stable  Jeanella Cara, PharmD, [place] [facility] Please see AMION for all Pharmacists' Contact Phone Numbers 02/11/2023, 9:31 AM

## 2023-02-11 NOTE — Progress Notes (Signed)
[facility] Progress Note  9 Days Post-Op  Subjective: CC:  Multiple Bms.  Tolerating tube feeds via cortrack.  No output with NG to gravity over last 24 hours.  Abdominal pain improving.  On TPN  Objective: Vital signs in last 24 hours: Temp:  [97.8 F (36.6 C)-99 F (37.2 C)] 97.8 F (36.6 C) (04/26 1808) Pulse Rate:  [68-84] 69 (04/27 0700) Resp:  [13-37] 23 (04/27 0700) BP: (108-154)/(46-100) 126/56 (04/27 0700) SpO2:  [87 %-98 %] 97 % (04/27 0700) Weight:  [95.5 kg-96.7 kg] 95.5 kg (04/27 0500) Last BM Date : 02/10/23  Intake/Output from previous day: 04/26 0701 - 04/27 0700 In: 2572.1 [I.V.:1492.8; Blood:403.3; NG/GT:526; IV Piggyback:150] Out: 1051 [Emesis/NG output:50; Stool:1] Intake/Output this shift: No intake/output data recorded.  PE: Gen:  Alert, NAD, pleasant Card:  Regular rate and rhythm, pedal pulses 2+ BL Pulm:  Normal effort, clear to auscultation bilaterally Abd: Soft, minimally tender without guarding, mild distention and tympany,  incisions C/D/I  NGT with 50 out over 24 hrs.  Cortrak in L nare, trickle. Skin: warm and dry, no rashes  Psych: A&Ox3   Lab Results:  Recent Labs    02/10/23 0352 02/10/23 1615 02/11/23 0416  WBC 9.2  --  12.9*  HGB 7.1* 8.0* 7.6*  HCT 21.3* 23.3* 23.4*  PLT 178  --  200    BMET Recent Labs    02/10/23 0352 02/11/23 0416  NA 130* 131*  K 3.2* 3.0*  CL 91* 96*  CO2 25 26  GLUCOSE 138* 202*  BUN 63* 47*  CREATININE 8.61* 6.03*  CALCIUM 8.2* 7.8*    PT/INR No results for input(s): "LABPROT", "INR" in the last 72 hours. CMP     Component Value Date/Time   NA 131 (L) 02/11/2023 0416   NA 142 12/05/2022 0000   K 3.0 (L) 02/11/2023 0416   CL 96 (L) 02/11/2023 0416   CO2 26 02/11/2023 0416   GLUCOSE 202 (H) 02/11/2023 0416   BUN 47 (H) 02/11/2023 0416   BUN 44 (A) 12/05/2022 0000   CREATININE 6.03 (H) 02/11/2023 0416   CREATININE 2.72 (H) 07/27/2021 1110   CREATININE 2.74 (H)  03/17/2021 1050   CALCIUM 7.8 (L) 02/11/2023 0416   PROT 6.5 02/10/2023 0352   ALBUMIN 2.2 (L) 02/10/2023 0352   AST 12 (L) 02/10/2023 0352   AST 28 07/27/2021 1110   ALT <5 02/10/2023 0352   ALT 19 07/27/2021 1110   ALKPHOS 59 02/10/2023 0352   BILITOT 0.5 02/10/2023 0352   BILITOT 0.3 07/27/2021 1110   GFRNONAA 8 (L) 02/11/2023 0416   GFRNONAA 20 (L) 07/27/2021 1110   GFRAA 39 (L) 07/14/2020 1053   GFRAA 33 (L) 08/12/2019 1447   Lipase     Component Value Date/Time   LIPASE 51 01/28/2023 2358       Studies/Results: VAS [place] UPPER EXT VEIN MAPPING (PRE-OP AVF)  Result Date: 02/10/2023 UPPER EXTREMITY VEIN MAPPING Patient Name:  [name] [facility]  Date of Exam:   02/09/2023 Medical Rec #: [id number]              Accession #:    [id number] Date of Birth: 05/26/1966              Patient Gender: F Patient Age:   6 years Exam Location:  [facility] Procedure:      VAS [place] UPPER EXT VEIN MAPPING (PRE-OP AVF) Referring Phys: Emilie Rutter --------------------------------------------------------------------------------  Indications: Pre-access. Comparison Study: No  previous study. Performing Technologist: McKayla Maag RVT, [place]  Examination Guidelines: A complete evaluation includes B-mode imaging, spectral Doppler, color Doppler, and power Doppler as needed of all accessible portions of each vessel. Bilateral testing is considered an integral part of a complete examination. Limited examinations for reoccurring indications may be performed as noted. +-----------------+-------------+----------+--------------+ Right Cephalic   Diameter (cm)Depth (cm)   Findings    +-----------------+-------------+----------+--------------+ Shoulder             0.18        1.59                  +-----------------+-------------+----------+--------------+ Prox upper arm       0.27        1.17                  +-----------------+-------------+----------+--------------+ Mid upper arm        0.22         1.06                  +-----------------+-------------+----------+--------------+ Dist upper arm       0.34        0.96                  +-----------------+-------------+----------+--------------+ Antecubital fossa    0.22        0.71                  +-----------------+-------------+----------+--------------+ Prox forearm                            not visualized +-----------------+-------------+----------+--------------+ Mid forearm                             not visualized +-----------------+-------------+----------+--------------+ Dist forearm                            not visualized +-----------------+-------------+----------+--------------+ Wrist                                   not visualized +-----------------+-------------+----------+--------------+ +-----------------+-------------+----------+--------------+ Right Basilic    Diameter (cm)Depth (cm)   Findings    +-----------------+-------------+----------+--------------+ Shoulder                                not visualized +-----------------+-------------+----------+--------------+ Prox upper arm       0.50        2.48   not visualized +-----------------+-------------+----------+--------------+ Mid upper arm        0.43        2.03                  +-----------------+-------------+----------+--------------+ Dist upper arm       0.28        1.94                  +-----------------+-------------+----------+--------------+ Antecubital fossa    0.27        0.93                  +-----------------+-------------+----------+--------------+ Prox forearm         0.23        0.30                  +-----------------+-------------+----------+--------------+  Mid forearm          0.19        0.27                  +-----------------+-------------+----------+--------------+ Distal forearm                             Thrombus     +-----------------+-------------+----------+--------------+ Wrist                                   not visualized +-----------------+-------------+----------+--------------+ Acute superficial thrombus is noted in the right basilic vein located at the distal forearm. +-----------------+-------------+----------+--------------+ Left Cephalic    Diameter (cm)Depth (cm)   Findings    +-----------------+-------------+----------+--------------+ Shoulder             0.26        1.99                  +-----------------+-------------+----------+--------------+ Prox upper arm       0.25        1.30                  +-----------------+-------------+----------+--------------+ Mid upper arm        0.32        0.91     branching    +-----------------+-------------+----------+--------------+ Dist upper arm                             Thrombus    +-----------------+-------------+----------+--------------+ Antecubital fossa                          Thrombus    +-----------------+-------------+----------+--------------+ Prox forearm                               Thrombus    +-----------------+-------------+----------+--------------+ Mid forearm                                Thrombus    +-----------------+-------------+----------+--------------+ Dist forearm         0.14        0.34                  +-----------------+-------------+----------+--------------+ Wrist                                   not visualized +-----------------+-------------+----------+--------------+ +-----------------+-------------+----------+--------------+ Left Basilic     Diameter (cm)Depth (cm)   Findings    +-----------------+-------------+----------+--------------+ Prox upper arm       0.26        2.12                  +-----------------+-------------+----------+--------------+ Mid upper arm        0.22        1.99                   +-----------------+-------------+----------+--------------+ Dist upper arm       0.20        1.11                  +-----------------+-------------+----------+--------------+ Antecubital fossa    0.19  0.64                  +-----------------+-------------+----------+--------------+ Prox forearm         0.17        0.44                  +-----------------+-------------+----------+--------------+ Mid forearm          0.13        0.47                  +-----------------+-------------+----------+--------------+ Distal forearm       0.07        0.24                  +-----------------+-------------+----------+--------------+ Wrist                                   not visualized +-----------------+-------------+----------+--------------+ Acute superficial thrombus is noted in the left cephalic vein located at the distal upper arm to mid forearm. *See table(s) above for measurements and observations.  Diagnosing physician: Gerarda Fraction Electronically signed by Gerarda Fraction on 02/10/2023 at 4:32:53 PM.    Final    DG Abd Portable 1V  Result Date: 02/10/2023 CLINICAL DATA:  Small bowel obstruction. EXAM: PORTABLE ABDOMEN - 1 VIEW COMPARISON:  One-view abdomen 02/09/2023 FINDINGS: A small were feeding tube terminates in the distal duodenum. NG tube is coiled in the stomach. Gas-filled loops of small bowel demonstrate similar dilation to the prior exam. No free air is present. Aortic stent graft is in place. Tip of the dialysis catheter is again seen the inferior cavoatrial junction. IMPRESSION: 1. Stable dilation of small bowel loops compatible with small bowel obstruction. 2. Support apparatus as above. 3. No significant interval change. Electronically Signed   By: Marin Roberts M.D.   On: 02/10/2023 08:21    Anti-infectives: Anti-infectives (From admission, onward)    Start     Dose/Rate Route Frequency Ordered Stop   02/11/23 1000  fluconazole (DIFLUCAN) tablet 100 mg         100 mg Per Tube Daily 02/10/23 1239 02/18/23 0959   02/10/23 1330  fluconazole (DIFLUCAN) tablet 200 mg        200 mg Per Tube  Once 02/10/23 1239 02/10/23 1838   02/02/23 1915  ceFAZolin (ANCEF) IVPB 2g/100 mL premix        2 g 200 mL/hr over 30 Minutes Intravenous Every 8 hours 02/02/23 1820 02/03/23 0337   02/02/23 1452  ceFAZolin (ANCEF) 2-4 GM/100ML-% IVPB       Note to Pharmacy: Payton Emerald A: cabinet override      02/02/23 1452 02/03/23 0259   02/02/23 1450  ceFAZolin (ANCEF) 3-0.9 GM/100ML-% IVPB  Status:  Discontinued       Note to Pharmacy: Payton Emerald A: cabinet override      02/02/23 1450 02/02/23 1453   02/02/23 1000  dolutegravir ([name]) tablet 50 mg        50 mg Per Tube Daily 02/01/23 2052     02/02/23 1000  emtricitabine-tenofovir AF (DESCOVY) 200-25 MG per tablet 1 tablet        1 tablet Per Tube Daily 02/01/23 2052     01/30/23 1531  ceFAZolin (ANCEF) IVPB 2g/100 mL premix        over 30 Minutes Intravenous Continuous PRN 01/30/23 1537 01/30/23 1531   01/29/23 1115  dolutegravir ([name]) tablet  50 mg  Status:  Discontinued        50 mg Oral Daily 01/29/23 1021 02/01/23 2052   01/29/23 1115  emtricitabine-tenofovir AF (DESCOVY) 200-25 MG per tablet 1 tablet  Status:  Discontinued        1 tablet Oral Daily 01/29/23 1021 02/01/23 2052        Assessment/Plan  SBO vs ileus 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 Karin Lieu - CTA 4/23 shows interval placement of an aortic stent graft without worrisome complicating features, as well as dilated loops of small bowel with transition noted in the distal ileum concerning for SBO.  - WBC 12.9 today hemodynamically stable - Given history seems more likely to be an ileus. If she has a distal SBO then it is a partial SBO because contrast from CT made it to her colon. 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  This care required moderate level of medical decision making.   Quentin Ore, MD

## 2023-02-11 NOTE — Progress Notes (Signed)
NAME:  Arielys Wandersee, MRN:  [id number], DOB:  24-Nov-1965, LOS: 13 ADMISSION DATE:  01/29/2023, CONSULTATION DATE:  01/29/23 REFERRING MD:  EDP, CHIEF COMPLAINT:  aortic dissection   History of Present Illness:  57 yo female with pmh t2dm, ckd4, hiv, sarcoidosis who prsented to [facility] with complaints of back and abdominal pain with nausea and non bloody vomiting. Her family at bedside endorse this has been ongoing for 2 days not. Family provides most history as pt is quite drowsy after pain medication. The state that pt had no other associated symptoms. She had been trying to treat herself for consitpation thinking that was the cause of her pain. No fevers/ chills. No diarrhea. No recent change in medications.   She presented with elevated BP. Systolics >200. She was started on esmolol and nitro gtts and vascular is coming to eval.   Pertinent  Medical History  T2dm Htn Hyperlipidemia Hiv Hypothyroidism ckd4  Significant Hospital Events: Including procedures, antibiotic start and stop dates in addition to other pertinent events   Admitted to hospital 4/14, on cardene and esmolol 4/15 TDC placement, uremic encephalopathy 4/18 CT angio c/a/b, AO bifem. TEVAR  4/19 AKI on CKD requiring RRT  4/20 onward developed ileus vs SBO and intermittent pressor need 4/26 tolerating tube feeds and clear liquids  Interim History / Subjective:  Had additional bowel movement and continuing to pass gas.  Nausea persists but not worse. Able to tolerate clears without emesis.   Objective   Blood pressure 130/64, pulse 68, temperature 98.4 F (36.9 C), temperature source Oral, resp. rate 16, height 5\' 5"  (1.651 m), weight 95.5 kg, last menstrual period 03/31/2014, SpO2 92 %.        Intake/Output Summary (Last 24 hours) at 02/11/2023 1458 Last data filed at 02/11/2023 1400 Gross per 24 hour  Intake 2576.8 ml  Output 1051 ml  Net 1525.8 ml    Filed Weights   02/10/23 1450 02/10/23 1808  02/11/23 0500  Weight: 96.7 kg 95.8 kg 95.5 kg   Constitutional: obese, no acute distress HENT: normocephalic atraumatic Eyes: conjunctiva non-erythematous Neck: supple Pulmonary/Chest: normal work of breathing on 3L [place] Abdominal: soft, non-tender, improved distension, BS still distant.  MSK: normal bulk and tone. No lower extremity edema. 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  - Will start low dose BB ( was on quite a bit of medication at home)  High suspicion OSA-  - Needs outpatient sleep referral  Ileus  improving At risk malnutrition -NGT removed and started on clears.  -Increase post-pyloric tube feeds to goal   -Once tube feeds at goal for 48h, stop TPN (thus potentially 4/29) - Continue to advance oral diet from clears every 48h or so as tolerated.  -Progressive ambulation.  -Continue Reglan for now.  AKI on CKD 5 -- now ESRD  Hyponatremia  Hyperphosphatemia Hypokalemia AGMA -iHD per nephrology, appreciate their assistance.  -electrolyte management per nephrology  Normocytic anemia due to renal failure.  -ESA per nephrology -Resume prophylactic heparin.   DM2   -SSI correction scale only for now.  HIV; well controlled recently with undetectable VL and CD4 685 in 12/2022 -Cont [name], Descovy per PP cortrak. Try to avoid missed doses to prevent resistance  Thrush Will treat with diflucan with history of HIV and patietn endorsing some throat discomfort -diflucan for two doses, end 04/27 -nystatin swish and swallow  Hypothyroidism- last  TSH 2.6 08/23/22 -synthroid   GERD -PPI  Best Practice (right click and "Reselect all SmartList Selections" daily)   Diet/type: TPN, full tube feeds, clear liquids DVT prophylaxis: heparin [place] GI prophylaxis: protonix Lines: Dialysis Catheter, TLC Foley:  N/A Code Status:  full code Last date of multidisciplinary goals of care discussion [patient updated 4/25]  Lynnell Catalan, MD [facility] ICU Physician [facility] [place] [facility]  Pager: [phone] Or Epic Secure Chat After hours: [phone].  02/11/2023, 3:09 PM

## 2023-02-12 DIAGNOSIS — E785 Hyperlipidemia, unspecified: Secondary | ICD-10-CM

## 2023-02-12 DIAGNOSIS — E1169 Type 2 diabetes mellitus with other specified complication: Secondary | ICD-10-CM

## 2023-02-12 DIAGNOSIS — G9340 Encephalopathy, unspecified: Secondary | ICD-10-CM | POA: Diagnosis not present

## 2023-02-12 DIAGNOSIS — I7103 Dissection of thoracoabdominal aorta: Secondary | ICD-10-CM | POA: Diagnosis not present

## 2023-02-12 DIAGNOSIS — E440 Moderate protein-calorie malnutrition: Secondary | ICD-10-CM | POA: Diagnosis not present

## 2023-02-12 DIAGNOSIS — N179 Acute kidney failure, unspecified: Secondary | ICD-10-CM | POA: Diagnosis not present

## 2023-02-12 LAB — BASIC METABOLIC PANEL
Anion gap: 10 (ref 5–15)
BUN: 71 mg/dL — ABNORMAL HIGH (ref 6–20)
CO2: 21 mmol/L — ABNORMAL LOW (ref 22–32)
Calcium: 8.1 mg/dL — ABNORMAL LOW (ref 8.9–10.3)
Chloride: 98 mmol/L (ref 98–111)
Creatinine, Ser: 7.2 mg/dL — ABNORMAL HIGH (ref 0.44–1.00)
GFR, Estimated: 6 mL/min — ABNORMAL LOW (ref >60)
Glucose, Bld: 203 mg/dL — ABNORMAL HIGH (ref 70–99)
Potassium: 3.8 mmol/L (ref 3.5–5.1)
Sodium: 129 mmol/L — ABNORMAL LOW (ref 135–145)

## 2023-02-12 LAB — GLUCOSE, CAPILLARY
Glucose-Capillary: 180 mg/dL — ABNORMAL HIGH (ref 70–99)
Glucose-Capillary: 182 mg/dL — ABNORMAL HIGH (ref 70–99)
Glucose-Capillary: 184 mg/dL — ABNORMAL HIGH (ref 70–99)
Glucose-Capillary: 226 mg/dL — ABNORMAL HIGH (ref 70–99)
Glucose-Capillary: 236 mg/dL — ABNORMAL HIGH (ref 70–99)
Glucose-Capillary: 241 mg/dL — ABNORMAL HIGH (ref 70–99)
Glucose-Capillary: 266 mg/dL — ABNORMAL HIGH (ref 70–99)

## 2023-02-12 LAB — CBC
HCT: 22.9 % — ABNORMAL LOW (ref 36.0–46.0)
Hemoglobin: 7.5 g/dL — ABNORMAL LOW (ref 12.0–15.0)
MCH: 32.9 pg (ref 26.0–34.0)
MCHC: 32.8 g/dL (ref 30.0–36.0)
MCV: 100.4 fL — ABNORMAL HIGH (ref 80.0–100.0)
Platelets: 223 10*3/uL (ref 150–400)
RBC: 2.28 MIL/uL — ABNORMAL LOW (ref 3.87–5.11)
RDW: 16.2 % — ABNORMAL HIGH (ref 11.5–15.5)
WBC: 15.8 10*3/uL — ABNORMAL HIGH (ref 4.0–10.5)
nRBC: 0 % (ref 0.0–0.2)

## 2023-02-12 LAB — MAGNESIUM: Magnesium: 2.2 mg/dL (ref 1.7–2.4)

## 2023-02-12 LAB — PHOSPHORUS: Phosphorus: 2.2 mg/dL — ABNORMAL LOW (ref 2.5–4.6)

## 2023-02-12 MED ORDER — TRAVASOL 10 % IV SOLN
INTRAVENOUS | Status: AC
  Filled 2023-02-12: qty 428.4

## 2023-02-12 MED ORDER — CHLORHEXIDINE GLUCONATE CLOTH 2 % EX PADS: 6.0000 | MEDICATED_PAD | Freq: Every day | CUTANEOUS | Status: DC

## 2023-02-12 MED ORDER — PROCHLORPERAZINE EDISYLATE 10 MG/2ML IJ SOLN
10.0000 mg | INTRAMUSCULAR | Status: DC | PRN
  Administered 2023-02-12 – 2023-02-13 (×2): 10 mg via INTRAVENOUS
  Filled 2023-02-12 (×3): qty 2

## 2023-02-12 MED ORDER — CARVEDILOL 6.25 MG PO TABS
6.2500 mg | ORAL_TABLET | Freq: Two times a day (BID) | ORAL | Status: DC
  Administered 2023-02-12 – 2023-02-14 (×6): 6.25 mg via ORAL
  Filled 2023-02-12 (×6): qty 1

## 2023-02-12 MED ORDER — POTASSIUM PHOSPHATES 15 MMOLE/5ML IV SOLN
15.0000 mmol | Freq: Once | INTRAVENOUS | Status: DC
  Filled 2023-02-12: qty 5

## 2023-02-12 NOTE — Progress Notes (Signed)
PHARMACY - TOTAL PARENTERAL NUTRITION CONSULT NOTE   Indication: Prolonged ileus   Patient Measurements: Height: 5\' 5"  (165.1 cm) Weight: 98.7 kg (217 lb 9.5 oz) IBW/kg (Calculated) : 57 TPN AdjBW (KG): 66.8 Body mass index is 36.21 kg/m. Usual Weight: ~99 kg  Assessment:  57 yo F initially presented with type B aortic dissection now s/p TEVAR (4/18). Pt now with ongoing abdominal pain and dilated bowel loops on imaging c/f ileus. Trickle tube feeding attempted 4/22. Pt with ~5L of NG output 4/23. iHD on MWF schedule   Glucose / Insulin: CBG 165 - 226- received 19u SSI in last 24h  Electrolytes: Na 29, K 3.8, Cl 98, CO2 21, CoCa 9.6, Mag 2.2 Renal: Hemodialysis (MWF schedule)  Hepatic: LFTs WNL, Alb 2.2, Trigs 147 (4/25) Intake / Output; MIVF: NG output = 0 mL; 0mL removed with iHD LBM 4/28 x 1. GI Imaging:  4/24: KUB: ileus vs SBO 4/25: KUB: persistent, unchanged from 4/24 4/26: KUB: stable, no significant change  GI Surgeries / Procedures: n/a  Central access: CVC (4/24) TPN start date: 4/24   Nutritional Goals: Goal TPN rate is 70 mL/hr (provides 85 g of protein and 1880 kcals per day)  RD Assessment: Estimated Needs Total Energy Estimated Needs: 1750-1950 kcals Total Protein Estimated Needs: 85-105 g Total Fluid Estimated Needs: 1L plus UOP  Current Nutrition:  1/2 TPN Tube feeds at 40 ml/hr - goal rate 50 ml/hr - having abdominal pain and nausea  Plan:  Decrease TPN to 35 mL/hr at 1800 - to provide 50% of nutritional goals Electrolytes in TPN: Incr Na 75 mEq/L, K 10 mEq/L, Ca 2 mEq/L, Mg 7 mEq/L, and Phos 24mmol/L. Cl:Ace 2:1 -f/u tolerance of enteral feeds to TPN weaning - advancing to goal today Add standard MVI and trace elements, remove chromium in TPN Continue Moderate q4h SSI and adjust as needed  Monitor TPN labs on Mon/Thurs  Jeanella Cara, PharmD, [facility] Clinical Pharmacist Please see AMION for all Pharmacists' Contact Phone Numbers 02/12/2023, 8:43 AM

## 2023-02-12 NOTE — Progress Notes (Signed)
[facility] Progress Note    Assessment/ Plan:    # ESRD (CKD stage V with progression to ESRD) - Followed by our office as an outpatient.  Started HD due to uremic symptoms. S/p [facility] 4/15. IHD #1 4/16 - HD per MWF schedule for now.  Will increase UF with next HD - CLIP underway for outpatient HD unit placement - She states that she is no longer interested in PD.  She would like to do in-center HD.  Will need AVF/AVG planning once stabilized and per vascular note they are planning to place prior to discharge -Follow daily labs, strict I/O  # Type B dissection -VVS following. S/p TEVAR 4/18 -BP control as below   # Encephalopathy - resolved and per charting improvement may correlate with HD initiation   # HTN   - goals per vascular, CCM.  She was briefly on and off of levo while in the ICU with a bowel obstruction - optimize volume as tolerated with HD - she is now off of all anti-hypertensives.  Will first prioritize re-addition of beta blocker when need to add back an agent.  See coreg was added back last night for BP 150's and will increase dose to 6.25 mg BID today.  Note on multiple anti-hypertensives prior to the bowel obstruction  # HIV - therapy per primary team   # DM2 -per primary service  # Urinary retention   - Hx of per charting - UA re-ordered for today - Bladder scan ordered - if post-void over 300 mL or if random bladder scan over 300 mL and cannot void then would in/out cath - order placed  # Hyponatremia - optimize with HD/UF  # Small bowel obstruction - per primary team. Now having BM's   # Normocytic Anemia  - s/p PRBC's on 4/26  - iron deficiency - ferrlecit x 2 doses  - Started aranesp on 4/24.  Plan for 60 mcg weekly on Wednesdays going forward for now.  Titrate as needed  # Hypokalemia  - improved with repletion   # Hyperphosphatemia  - Improving with HD as well as in setting of small bowel obstruction. Last check was with  hypophosphatemia.      Disposition - Continue inpatient monitoring    Subjective:   She is still in the ICU.  Last HD on 4/26 with 1 liter UF.  She had zero mL for urine output charted over 4/27.  She is awake on arrival.  Not comfortable this morning - more abdominal pain.  Her NG tube was removed and she has been on tube feeds and TPN.  Didn't have a BM yesterday.  Review of systems:    Denies chest pain  Denies shortness of breath. Feels uncomfortable under ribcage  Abdominal pain is a little worse today. Some nausea   Does feel like she needs to urinate   Objective:   BP (!) 157/55   Pulse 72   Temp 98.8 F (37.1 C) (Oral)   Resp 20   Ht 5\' 5"  (1.651 m)   Wt 95.5 kg   LMP 03/31/2014 (LMP Unknown)   SpO2 96%   BMI 35.04 kg/m   Intake/Output Summary (Last 24 hours) at 02/12/2023 0555 Last data filed at 02/12/2023 0500 Gross per 24 hour  Intake 2372.87 ml  Output 60 ml  Net 2312.87 ml   Weight change:   Physical Exam:       General adult female in bed, uncomfortable HEENT normocephalic atraumatic extraocular  movements intact sclera anicteric Neck supple trachea midline Lungs clear to auscultation bilaterally anteriorly unlabored at rest; on 2 liters oxygen Heart S1S2 no rub Abdomen her abdomen is soft non-tender to palpation; less distended. obese habitus Extremities no edema lower extremities; no cyanosis or clubbing  Psych no anxiety or agitation Neuro patient is alert and conversant; provides history and follows commands Access: RIJ tunn catheter in place    Imaging: DG Abd Portable 1V  Result Date: 02/10/2023 CLINICAL DATA:  Small bowel obstruction. EXAM: PORTABLE ABDOMEN - 1 VIEW COMPARISON:  One-view abdomen 02/09/2023 FINDINGS: A small were feeding tube terminates in the distal duodenum. NG tube is coiled in the stomach. Gas-filled loops of small bowel demonstrate similar dilation to the prior exam. No free air is present. Aortic stent graft is in place. Tip  of the dialysis catheter is again seen the inferior cavoatrial junction. IMPRESSION: 1. Stable dilation of small bowel loops compatible with small bowel obstruction. 2. Support apparatus as above. 3. No significant interval change. Electronically Signed   By: Marin Roberts M.D.   On: 02/10/2023 08:21    Labs: BMET Recent Labs  Lab 02/06/23 0042 02/07/23 0034 02/08/23 0043 02/09/23 0355 02/10/23 0352 02/11/23 0416 02/12/23 0416  NA 128* 130* 127* 132* 130* 131* 129*  K 4.3 3.8 4.4 3.2* 3.2* 3.0* 3.8  CL 93* 93* 88* 92* 91* 96* 98  CO2 19* 25 22 28 25 26  21*  GLUCOSE 113* 174* 85 139* 138* 202* 203*  BUN 71* 50* 72* 47* 63* 47* 71*  CREATININE 9.08* 6.71* 9.05* 7.18* 8.61* 6.03* 7.20*  CALCIUM 8.2* 8.4* 8.7* 8.2* 8.2* 7.8* 8.1*  PHOS 8.8* 6.3* 8.4* 5.2* 4.8*  --  2.2*   CBC Recent Labs  Lab 02/09/23 1301 02/10/23 0352 02/10/23 1615 02/11/23 0416 02/12/23 0416  WBC 8.4 9.2  --  12.9* 15.8*  HGB 7.4* 7.1* 8.0* 7.6* 7.5*  HCT 22.4* 21.3* 23.3* 23.4* 22.9*  MCV 98.7 98.2  --  99.2 100.4*  PLT 188 178  --  200 223    Medications:     aspirin  81 mg Per Tube Daily   bisacodyl  10 mg Rectal Q0600   carvedilol  3.125 mg Oral BID WC   Chlorhexidine Gluconate Cloth  6 each Topical Daily   Chlorhexidine Gluconate Cloth  6 each Topical Q0600   [START ON 02/15/2023] darbepoetin (ARANESP) injection - NON-DIALYSIS  60 mcg Subcutaneous Q Wed-1800   dolutegravir  50 mg Per Tube Daily   emtricitabine-tenofovir AF  1 tablet Per Tube Daily   feeding supplement (PROSource TF20)  60 mL Per Tube Daily   fluconazole  100 mg Per Tube Daily   heparin injection (subcutaneous)  5,000 Units Subcutaneous Q12H   insulin aspart  0-15 Units Subcutaneous Q4H   levothyroxine  175 mcg Per Tube Q0600   lidocaine  1 patch Transdermal Q24H   metoCLOPramide (REGLAN) injection  5 mg Intravenous Q12H   nystatin  5 mL Oral QID   mouth rinse  15 mL Mouth Rinse 4 times per day   pantoprazole  (PROTONIX) IV  40 mg Intravenous Q24H   rosuvastatin  10 mg Per Tube Daily   sertraline  50 mg Per Tube Daily     Estanislado Emms, MD 6:15 AM 02/12/2023

## 2023-02-12 NOTE — Assessment & Plan Note (Signed)
Continue with antiretroviral therapy, Positive thrush, continue with diflucan.

## 2023-02-12 NOTE — Hospital Course (Signed)
[name] was admitted to the hospital with the working diagnosis of type B aortic dissection sp TEVAR.   57 yo female with the past medical history of sarcoidosis, CKD stage 4, and T2DM who presented with back pain, nausea and vomiting. Pain present for the last 2 days prior to hospitalization. On her initial physical examination her systolic blood pressure was >200 and she was started on esmolol drip, blood pressure 169/90, HR 63, RR 13 and 02 saturation 100%, lungs with no wheezing or rales, heart with S1 and S2 present and rhythmic, abdomen with no distention,. No lower extremity edema.   CT renal with findings suggestive of interval development of dissection the infrarenal abdominal aorta likely extending to the left common iliac artery. 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   CT angiography:  Type B acute aortic dissection arising in the proximal descending thoracic aorta distal to the origin of the left subclavian artery. There is a windsock sign in the mid descending thoracic aorta compatible with intimointimal intussusception. The dissection flap extends into the abdominal aorta, right common iliac artery, right external iliac artery, and left common femoral artery terminating at the bifurcation into the superficial and profunda femoral arteries.  The left internal iliac artery is not opacified likely occluded.   The celiac axis, left renal artery, and IMA arise from the true lumen. The right renal artery and probably the SMA arise from the false lumen.  Admitted to hospital 4/14, on cardene and esmolol 4/15 TDC placement, uremic encephalopathy 4/18 CT angio c/a/b, AO bifem. TEVAR  4/19 AKI on CKD requiring RRT  4/20 onward developed ileus vs SBO and intermittent pressor need 4/26 tolerating tube feeds and clear liquids  04/28 transferred to [facility].  04/29 discontinue TPN, advanced po diet, continue with tube feedings for  now.

## 2023-02-12 NOTE — Progress Notes (Addendum)
Progress Note   Patient: [name] ZOX:[id number] DOB: 1966/06/24 DOA: 01/29/2023     14 DOS: the patient was seen and examined on 02/12/2023   Brief hospital course: [name] was admitted to the hospital with the working diagnosis of type B aortic dissection sp TEVAR.   57 yo female with the past medical history of sarcoidosis, CKD stage 4, and T2DM who presented with back pain, nausea and vomiting. Pain present for the last 2 days prior to hospitalization. On her initial physical examination her systolic blood pressure was >200 and she was started on esmolol drip, blood pressure 169/90, HR 63, RR 13 and 02 saturation 100%, lungs with no wheezing or rales, heart with S1 and S2 present and rhythmic, abdomen with no distention,. No lower extremity edema.   CT renal with findings suggestive of interval development of dissection the infrarenal abdominal aorta likely extending to the left common iliac artery. 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   CT angiography:  Type B acute aortic dissection arising in the proximal descending thoracic aorta distal to the origin of the left subclavian artery. There is a windsock sign in the mid descending thoracic aorta compatible with intimointimal intussusception. The dissection flap extends into the abdominal aorta, right common iliac artery, right external iliac artery, and left common femoral artery terminating at the bifurcation into the superficial and profunda femoral arteries.  The left internal iliac artery is not opacified likely occluded.   The celiac axis, left renal artery, and IMA arise from the true lumen. The right renal artery and probably the SMA arise from the false lumen.  Admitted to hospital 4/14, on cardene and esmolol 4/15 TDC placement, uremic encephalopathy 4/18 CT angio c/a/b, AO bifem. TEVAR  4/19 AKI on CKD requiring RRT  4/20 onward developed ileus vs SBO  and intermittent pressor need 4/26 tolerating tube feeds and clear liquids  04/28 transferred to [facility].   Assessment and Plan: * Aortic dissection (HCC) Type B dissection s/p TEVAR (4/18) proximal descending thoracic aorta distal to origin of L subclav to iliacs, extending LLE to bifurcation of superficial and profunda fem arteries  Hx HTN  Systolic blood pressure 130 to 150 mmHg.   Plan to continue blood pressure control with carvedilol.  Post operative ileus, small bowel obstruction, clinically improving.   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 Hypokalemia/ pseudohyponatremia.   She has temporary dialysis catheter placed on and started on HD in 04/16.   Her current schedule is Monday-Wednesday and Friday.  Urinary retention, foley cathter in place.   Anemia of chronic renal disease. Post operative anemia, sp PRBC transfusion 04.26. Iron deficiency anemia sp IV iron. Follow up hgb today at 7.5, if persistent below 8, patient will benefit from PRBC transfusion.  Continue with EPO.   Encephalopathy acute Clinically has resolved.  Continue neuro checks per unit protocol.  Swallow dysfunction, she has cortrack in placed for tube feedings.    Hypertension Continue blood pressure control with carvedilol.   Malnutrition of moderate degree (HCC) Continue nutritional support.   HIV (human immunodeficiency virus infection) (HCC) Continue with antiretroviral therapy, Positive thrush, continue with diflucan.   Type 2 diabetes mellitus with hyperlipidemia (HCC) Continue insulin sliding scale for glucose cover and monitoring.  Hypothyroidism, continue levothyroxine.         Subjective: Patient with no chest pain  or dyspnea, continue very weak and deconditioned   Physical Exam: Vitals:   02/12/23 0800 02/12/23 0900 02/12/23 1000 02/12/23 1100  BP: (!) 136/55 (!) 134/49 (!) 147/59   Pulse: 68 71 67   Resp:  (!) 26 (!) 22 19 20   Temp:      TempSrc:      SpO2: 94% 95% 95%   Weight:      Height:       Neurology awake and alert ENT with mild pallor, cortrack in place. Cardiovascular with S1 and S2 present and rhythmic with no gallops, or rubs, positive murmur at the right lower sternal border. Respiratory with no rales or wheezing Abdomen with no distention  No lower extremity edema  Data Reviewed:    Family Communication: no family at the bedside   Disposition: Status is: Inpatient Remains inpatient appropriate because: post op care   Planned Discharge Destination: Home    Author: Coralie Keens, MD 02/12/2023 1:04 PM  For on call review [email or web address].

## 2023-02-12 NOTE — Assessment & Plan Note (Addendum)
Continue blood pressure control with carvedilol.  

## 2023-02-12 NOTE — Assessment & Plan Note (Signed)
Continue insulin sliding scale for glucose cover and monitoring.  Hypothyroidism, continue levothyroxine.

## 2023-02-12 NOTE — Assessment & Plan Note (Addendum)
Type B dissection s/p TEVAR (4/18) proximal descending thoracic aorta distal to origin of L subclav to iliacs, extending LLE to bifurcation of superficial and profunda fem arteries  Hx HTN  Systolic blood pressure 140 to 170 mmHg.   Increase carvedilol to 12.5 mg twice daily with goal systolic blood pressure < 120s.   Post operative ileus, small bowel obstruction, clinically improving.  Diet advanced today to soft diet per general surgery.   -Patient however with poor oral intake we will continue tube feedings for now.  -TPN has been discontinued. -Will need outpatient follow-up with vascular surgery.

## 2023-02-12 NOTE — Assessment & Plan Note (Addendum)
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 Hypokalemia/ pseudohyponatremia/hypophosphatemia.   She has temporary dialysis catheter placed on and started on HD in 04/16.   -Patient currently on Monday Wednesday Friday schedule and cleared process began for outpatient HD. -It is noted per nephrology patient no longer interested in PD and would like to do in center HD. -Mental status improved with hemodialysis.  -Phosphorus at 1.9 and being repleted IV.  Urinary retention, foley cathter in place.   Anemia of chronic renal disease. Post operative anemia,   sp PRBC transfusion 04.26 Iron deficiency anemia sp IV iron.  Hemoglobin currently at 9.6 from 5.9.   -No signs of active bleeding.    Continue with EPO.

## 2023-02-12 NOTE — Assessment & Plan Note (Signed)
Clinically has resolved.  Continue neuro checks per unit protocol.  Swallow dysfunction, she has cortrack in placed for tube feedings.  Dysphagia is improving.  Continue nutritional support.   Depression, continue with sertraline.

## 2023-02-12 NOTE — Progress Notes (Signed)
[facility] Progress Note  10 Days Post-Op  Subjective: CC:  Multiple Bms.  Tolerating tube feeds via cortrack. NG removed yesterday.  Epigastric abdominal pain continues.  Some nausea with variable response to zofran.  Objective: Vital signs in last 24 hours: Temp:  [98.3 F (36.8 C)-98.8 F (37.1 C)] 98.8 F (37.1 C) (04/28 0000) Pulse Rate:  [64-74] 68 (04/28 0800) Resp:  [13-33] 26 (04/28 0800) BP: (127-164)/(49-67) 136/55 (04/28 0800) SpO2:  [88 %-97 %] 94 % (04/28 0800) Weight:  [98.7 kg] 98.7 kg (04/28 0500) Last BM Date : 02/12/23  Intake/Output from previous day: 04/27 0701 - 04/28 0700 In: 2316 [P.O.:60; I.V.:1209.6; NG/GT:746.5; IV Piggyback:299.9] Out: 10 [Emesis/NG output:10] Intake/Output this shift: Total I/O In: 74.9 [I.V.:34.9; NG/GT:40] Out: -   PE: Gen:  Alert, NAD, pleasant Card:  Regular rate and rhythm, pedal pulses 2+ BL Pulm:  Normal effort, clear to auscultation bilaterally Abd: Soft, minimally tender without guarding, mild distention and tympany,  incisions C/D/I  NGT removed  Cortrak in L nare, running at 40 this morning, goal 50. Skin: warm and dry, no rashes  Psych: A&Ox3   Lab Results:  Recent Labs    02/11/23 0416 02/12/23 0416  WBC 12.9* 15.8*  HGB 7.6* 7.5*  HCT 23.4* 22.9*  PLT 200 223    BMET Recent Labs    02/11/23 0416 02/12/23 0416  NA 131* 129*  K 3.0* 3.8  CL 96* 98  CO2 26 21*  GLUCOSE 202* 203*  BUN 47* 71*  CREATININE 6.03* 7.20*  CALCIUM 7.8* 8.1*    PT/INR No results for input(s): "LABPROT", "INR" in the last 72 hours. CMP     Component Value Date/Time   NA 129 (L) 02/12/2023 0416   NA 142 12/05/2022 0000   K 3.8 02/12/2023 0416   CL 98 02/12/2023 0416   CO2 21 (L) 02/12/2023 0416   GLUCOSE 203 (H) 02/12/2023 0416   BUN 71 (H) 02/12/2023 0416   BUN 44 (A) 12/05/2022 0000   CREATININE 7.20 (H) 02/12/2023 0416   CREATININE 2.72 (H) 07/27/2021 1110   CREATININE 2.74 (H) 03/17/2021  1050   CALCIUM 8.1 (L) 02/12/2023 0416   PROT 6.5 02/10/2023 0352   ALBUMIN 2.2 (L) 02/10/2023 0352   AST 12 (L) 02/10/2023 0352   AST 28 07/27/2021 1110   ALT <5 02/10/2023 0352   ALT 19 07/27/2021 1110   ALKPHOS 59 02/10/2023 0352   BILITOT 0.5 02/10/2023 0352   BILITOT 0.3 07/27/2021 1110   GFRNONAA 6 (L) 02/12/2023 0416   GFRNONAA 20 (L) 07/27/2021 1110   GFRAA 39 (L) 07/14/2020 1053   GFRAA 33 (L) 08/12/2019 1447   Lipase     Component Value Date/Time   LIPASE 51 01/28/2023 2358       Studies/Results: No results found.  Anti-infectives: Anti-infectives (From admission, onward)    Start     Dose/Rate Route Frequency Ordered Stop   02/11/23 1000  fluconazole (DIFLUCAN) tablet 100 mg        100 mg Per Tube Daily 02/10/23 1239 02/18/23 0959   02/10/23 1330  fluconazole (DIFLUCAN) tablet 200 mg        200 mg Per Tube  Once 02/10/23 1239 02/10/23 1838   02/02/23 1915  ceFAZolin (ANCEF) IVPB 2g/100 mL premix        2 g 200 mL/hr over 30 Minutes Intravenous Every 8 hours 02/02/23 1820 02/03/23 0337   02/02/23 1452  ceFAZolin (ANCEF) 2-4 GM/100ML-% IVPB  Note to Pharmacy: Payton Emerald A: cabinet override      02/02/23 1452 02/03/23 0259   02/02/23 1450  ceFAZolin (ANCEF) 3-0.9 GM/100ML-% IVPB  Status:  Discontinued       Note to Pharmacy: Payton Emerald A: cabinet override      02/02/23 1450 02/02/23 1453   02/02/23 1000  dolutegravir ([name]) tablet 50 mg        50 mg Per Tube Daily 02/01/23 2052     02/02/23 1000  emtricitabine-tenofovir AF (DESCOVY) 200-25 MG per tablet 1 tablet        1 tablet Per Tube Daily 02/01/23 2052     01/30/23 1531  ceFAZolin (ANCEF) IVPB 2g/100 mL premix        over 30 Minutes Intravenous Continuous PRN 01/30/23 1537 01/30/23 1531   01/29/23 1115  dolutegravir ([name]) tablet 50 mg  Status:  Discontinued        50 mg Oral Daily 01/29/23 1021 02/01/23 2052   01/29/23 1115  emtricitabine-tenofovir AF (DESCOVY) 200-25 MG per tablet 1  tablet  Status:  Discontinued        1 tablet Oral Daily 01/29/23 1021 02/01/23 2052        Assessment/Plan  SBO vs ileus 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 Karin Lieu - CTA 4/23 shows interval placement of an aortic stent graft without worrisome complicating features, as well as dilated loops of small bowel with transition noted in the distal ileum concerning for SBO.  - WBC 15.8 from 12.9 today, hemodynamically stable - Given history seems more likely to be an ileus. If she has a distal SBO then it is a partial SBO because contrast from CT made it to her colon. - Some pain and nausea with tube feeds advancing.  Add compazine.  Will continue to follow to ensure she tolerates full tube feeds.   FEN - Tube feeds advancing to goal, could consider po diet once able VTE - sq heparin ID - none currently   AKI on CKD5 - now ESRD DM HTN HLD HIV Hypothyroidism     LOS: 14 days   I reviewed nursing notes, Consultant vascular notes, hospitalist notes, last 24 h vitals and pain scores, last 48 h intake and output, last 24 h labs and trends, and last 24 h imaging results.  This care required moderate level of medical decision making.   Quentin Ore, MD

## 2023-02-12 NOTE — Assessment & Plan Note (Signed)
Continue nutritional support.  -TPN discontinued. -Diet advanced to a soft diet today, however patient with poor oral intake and as such we will continue tube feeds via Cortrack until patient with adequate oral intake.

## 2023-02-13 ENCOUNTER — Inpatient Hospital Stay (HOSPITAL_COMMUNITY): Payer: Medicare Other

## 2023-02-13 ENCOUNTER — Ambulatory Visit: Payer: Commercial Managed Care - HMO | Admitting: Skilled Nursing Facility1

## 2023-02-13 DIAGNOSIS — G9340 Encephalopathy, unspecified: Secondary | ICD-10-CM | POA: Diagnosis not present

## 2023-02-13 DIAGNOSIS — I7103 Dissection of thoracoabdominal aorta: Secondary | ICD-10-CM | POA: Diagnosis not present

## 2023-02-13 DIAGNOSIS — E039 Hypothyroidism, unspecified: Secondary | ICD-10-CM

## 2023-02-13 DIAGNOSIS — E440 Moderate protein-calorie malnutrition: Secondary | ICD-10-CM | POA: Diagnosis not present

## 2023-02-13 DIAGNOSIS — N179 Acute kidney failure, unspecified: Secondary | ICD-10-CM | POA: Diagnosis not present

## 2023-02-13 LAB — COMPREHENSIVE METABOLIC PANEL
ALT: 20 U/L (ref 0–44)
AST: 35 U/L (ref 15–41)
Albumin: 2.2 g/dL — ABNORMAL LOW (ref 3.5–5.0)
Alkaline Phosphatase: 158 U/L — ABNORMAL HIGH (ref 38–126)
Anion gap: 10 (ref 5–15)
BUN: 94 mg/dL — ABNORMAL HIGH (ref 6–20)
CO2: 20 mmol/L — ABNORMAL LOW (ref 22–32)
Calcium: 8.1 mg/dL — ABNORMAL LOW (ref 8.9–10.3)
Chloride: 98 mmol/L (ref 98–111)
Creatinine, Ser: 8.44 mg/dL — ABNORMAL HIGH (ref 0.44–1.00)
GFR, Estimated: 5 mL/min — ABNORMAL LOW (ref >60)
Glucose, Bld: 211 mg/dL — ABNORMAL HIGH (ref 70–99)
Potassium: 4 mmol/L (ref 3.5–5.1)
Sodium: 128 mmol/L — ABNORMAL LOW (ref 135–145)
Total Bilirubin: 0.5 mg/dL (ref 0.3–1.2)
Total Protein: 6.9 g/dL (ref 6.5–8.1)

## 2023-02-13 LAB — GLUCOSE, CAPILLARY
Glucose-Capillary: 219 mg/dL — ABNORMAL HIGH (ref 70–99)
Glucose-Capillary: 240 mg/dL — ABNORMAL HIGH (ref 70–99)
Glucose-Capillary: 234 mg/dL — ABNORMAL HIGH (ref 70–99)

## 2023-02-13 LAB — BPAM RBC
Blood Product Expiration Date: 202405282359
ISSUE DATE / TIME: 202404291113

## 2023-02-13 LAB — PHOSPHORUS: Phosphorus: 2 mg/dL — ABNORMAL LOW (ref 2.5–4.6)

## 2023-02-13 LAB — PREPARE RBC (CROSSMATCH)

## 2023-02-13 LAB — CBC
HCT: 18.6 % — ABNORMAL LOW (ref 36.0–46.0)
Hemoglobin: 5.9 g/dL — CL (ref 12.0–15.0)
MCH: 32.6 pg (ref 26.0–34.0)
MCHC: 31.7 g/dL (ref 30.0–36.0)
MCV: 102.8 fL — ABNORMAL HIGH (ref 80.0–100.0)
Platelets: 183 10*3/uL (ref 150–400)
RBC: 1.81 MIL/uL — ABNORMAL LOW (ref 3.87–5.11)
RDW: 16.5 % — ABNORMAL HIGH (ref 11.5–15.5)
WBC: 13.3 10*3/uL — ABNORMAL HIGH (ref 4.0–10.5)
nRBC: 0 % (ref 0.0–0.2)

## 2023-02-13 LAB — TRIGLYCERIDES: Triglycerides: 79 mg/dL (ref <150)

## 2023-02-13 LAB — TYPE AND SCREEN: ABO/RH(D): O POS

## 2023-02-13 LAB — MAGNESIUM: Magnesium: 2.3 mg/dL (ref 1.7–2.4)

## 2023-02-13 MED ORDER — METOCLOPRAMIDE HCL 5 MG/ML IJ SOLN
5.0000 mg | Freq: Four times a day (QID) | INTRAMUSCULAR | Status: DC | PRN
  Administered 2023-02-13 – 2023-02-21 (×7): 5 mg via INTRAVENOUS
  Filled 2023-02-13 (×7): qty 2

## 2023-02-13 MED ORDER — RENA-VITE PO TABS
1.0000 | ORAL_TABLET | Freq: Every day | ORAL | Status: DC
  Administered 2023-02-14 – 2023-02-17 (×4): 1
  Filled 2023-02-13 (×5): qty 1

## 2023-02-13 MED ORDER — ENSURE ENLIVE PO LIQD: 237.0000 mL | Freq: Two times a day (BID) | ORAL | Status: DC

## 2023-02-13 MED ORDER — VITAL 1.5 CAL PO LIQD
1000.0000 mL | ORAL | Status: DC
  Administered 2023-02-13 – 2023-02-15 (×3): 1000 mL
  Filled 2023-02-13 (×4): qty 1000

## 2023-02-13 MED ORDER — OMEPRAZOLE 2 MG/ML ORAL SUSPENSION
40.0000 mg | Freq: Two times a day (BID) | ORAL | Status: DC
  Administered 2023-02-14 – 2023-02-16 (×5): 40 mg
  Filled 2023-02-13 (×10): qty 20

## 2023-02-13 MED ORDER — SODIUM CHLORIDE 0.9% IV SOLUTION: Freq: Once | INTRAVENOUS | Status: AC

## 2023-02-13 MED ORDER — SODIUM PHOSPHATES 45 MMOLE/15ML IV SOLN
15.0000 mmol | Freq: Once | INTRAVENOUS | Status: AC
  Administered 2023-02-13: 15 mmol via INTRAVENOUS
  Filled 2023-02-13: qty 5

## 2023-02-13 NOTE — Progress Notes (Signed)
Pt endorses nausea unrelieved by ordered PRNs. Tube feeds paused and provider paged.

## 2023-02-13 NOTE — Progress Notes (Addendum)
  Progress Note    02/13/2023 7:22 AM 11 Days Post-Op  Subjective:  very nauseated this morning, coughing up a lot of mucus. Denies any chest pain or shortness of breath. She does report some lightheadedness when trying to stand    Vitals:   02/12/23 2345 02/13/23 0355  BP: (!) 136/49 (!) 142/51  Pulse:    Resp: 18 (!) 22  Temp: 98.4 F (36.9 C) 98.1 F (36.7 C)  SpO2: 93% 96%   Physical Exam: Cardiac:  regular Lungs:  non labored Extremities:  well perfused and warm with palpable radial and DP pulses bilaterally Abdomen:  obese, soft Neurologic: alert and oriented  CBC    Component Value Date/Time   WBC 13.3 (H) 02/13/2023 0409   RBC 1.81 (L) 02/13/2023 0409   HGB 5.9 (LL) 02/13/2023 0409   HGB 11.7 (L) 07/27/2021 1110   HCT 18.6 (L) 02/13/2023 0409   PLT 183 02/13/2023 0409   PLT 228 07/27/2021 1110   MCV 102.8 (H) 02/13/2023 0409   MCH 32.6 02/13/2023 0409   MCHC 31.7 02/13/2023 0409   RDW 16.5 (H) 02/13/2023 0409   LYMPHSABS 1.6 12/19/2022 1242   MONOABS 0.7 12/19/2022 1242   EOSABS 0.2 12/19/2022 1242   BASOSABS 0.0 12/19/2022 1242    BMET    Component Value Date/Time   NA 128 (L) 02/13/2023 0409   NA 142 12/05/2022 0000   K 4.0 02/13/2023 0409   CL 98 02/13/2023 0409   CO2 20 (L) 02/13/2023 0409   GLUCOSE 211 (H) 02/13/2023 0409   BUN 94 (H) 02/13/2023 0409   BUN 44 (A) 12/05/2022 0000   CREATININE 8.44 (H) 02/13/2023 0409   CREATININE 2.72 (H) 07/27/2021 1110   CREATININE 2.74 (H) 03/17/2021 1050   CALCIUM 8.1 (L) 02/13/2023 0409   GFRNONAA 5 (L) 02/13/2023 0409   GFRNONAA 20 (L) 07/27/2021 1110   GFRAA 39 (L) 07/14/2020 1053   GFRAA 33 (L) 08/12/2019 1447    INR    Component Value Date/Time   INR 1.0 07/14/2019 0312     Intake/Output Summary (Last 24 hours) at 02/13/2023 1610 Last data filed at 02/13/2023 0600 Gross per 24 hour  Intake 2012.47 ml  Output 0 ml  Net 2012.47 ml     Assessment/Plan:  57 y.o. female is s/p TEVAR for  TBAD 11 Days Post-Op   Extremities well perfused with palpable radial and Dp pulses bilaterally Very Nauseated this morning Hgb 5.9 this morning. PRBC ordered 2 Units HD per nephrology due to uremic symptoms Continue BP control Remains stable from vascular standpoint   Graceann Congress, PA-C [facility] [phone] 02/13/2023 7:22 AM  VASCULAR STAFF ADDENDUM: I have independently interviewed and examined the patient. I agree with the above.  Feels better than she did this morning.  Working to advance diet. Bowels functioning. Encephalopathic, but pleasant   Fara Olden, MD [facility] Phone Number: [phone] 02/13/2023 5:57 PM

## 2023-02-13 NOTE — Progress Notes (Signed)
Progress Note   Patient: [name] ZOX:[id number] DOB: 09-09-1966 DOA: 01/29/2023     15 DOS: the patient was seen and examined on 02/13/2023   Brief hospital course: Mrs. Mariner was admitted to the hospital with the working diagnosis of type B aortic dissection sp TEVAR.   57 yo female with the past medical history of sarcoidosis, CKD stage 4, and T2DM who presented with back pain, nausea and vomiting. Pain present for the last 2 days prior to hospitalization. On her initial physical examination her systolic blood pressure was >200 and she was started on esmolol drip, blood pressure 169/90, HR 63, RR 13 and 02 saturation 100%, lungs with no wheezing or rales, heart with S1 and S2 present and rhythmic, abdomen with no distention,. No lower extremity edema.   CT renal with findings suggestive of interval development of dissection the infrarenal abdominal aorta likely extending to the left common iliac artery. 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   CT angiography:  Type B acute aortic dissection arising in the proximal descending thoracic aorta distal to the origin of the left subclavian artery. There is a windsock sign in the mid descending thoracic aorta compatible with intimointimal intussusception. The dissection flap extends into the abdominal aorta, right common iliac artery, right external iliac artery, and left common femoral artery terminating at the bifurcation into the superficial and profunda femoral arteries.  The left internal iliac artery is not opacified likely occluded.   The celiac axis, left renal artery, and IMA arise from the true lumen. The right renal artery and probably the SMA arise from the false lumen.  Admitted to hospital 4/14, on cardene and esmolol 4/15 TDC placement, uremic encephalopathy 4/18 CT angio c/a/b, AO bifem. TEVAR  4/19 AKI on CKD requiring RRT  4/20 onward developed ileus vs SBO  and intermittent pressor need 4/26 tolerating tube feeds and clear liquids  04/28 transferred to [facility].  04/29 discontinue TPN, advanced po diet, continue with tube feedings for now.   Assessment and Plan: * Aortic dissection (HCC) Type B dissection s/p TEVAR (4/18) proximal descending thoracic aorta distal to origin of L subclav to iliacs, extending LLE to bifurcation of superficial and profunda fem arteries  Hx HTN  Systolic blood pressure 130 to 150 mmHg.   Plan to continue blood pressure control with carvedilol.  Post operative ileus, small bowel obstruction, clinically improving.  Diet advanced today to full liquids, and will continue tube feedings for now. Discontinue TPN.   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 Hypokalemia/ pseudohyponatremia.   She has temporary dialysis catheter placed on and started on HD in 04/16.   Today serum Na is 128, K is 4,0 and serum bicarbonate at 20, BUN continue elevated at 94  P is 2,0.   Her current schedule is Monday-Wednesday and Friday.  Urinary retention, foley cathter in place.   Anemia of chronic renal disease. Post operative anemia,   sp PRBC transfusion 04.26 Iron deficiency anemia sp IV iron.  Follow up hgb is 5,9 today and had 2 unit PRBC transfused.  No signs of active bleeding.   Continue with EPO.   Encephalopathy acute Clinically has resolved.  Continue neuro checks per unit protocol.  Swallow dysfunction, she has cortrack in placed for tube feedings.  Dysphagia is improving.  Continue nutritional support.   Depression, continue with sertraline.  Hypertension Continue blood pressure control with carvedilol.   Malnutrition of moderate degree (HCC) Continue nutritional support.   HIV (human immunodeficiency virus infection) (HCC) Continue with antiretroviral therapy, Positive thrush, continue with diflucan.   Type 2 diabetes mellitus with  hyperlipidemia (HCC) Continue insulin sliding scale for glucose cover and monitoring.  Continue with rosuvastatin.   Hypothyroidism Continue with levothyroxine.         Subjective: Patient with no chest pain or dyspnea, she had nausea this morning. No vomiting, and tolerating tube feedings. Had 2 units PRBC transfused this morning.   Physical Exam: Vitals:   02/13/23 0412 02/13/23 0857 02/13/23 1130 02/13/23 1145  BP:  (!) 135/50 (!) 136/52 134/66  Pulse:  68 69 70  Resp:  20 (!) 22 (!) 27  Temp:  98.3 F (36.8 C) 98.2 F (36.8 C) 98.4 F (36.9 C)  TempSrc:  Oral Oral Oral  SpO2:  92% 91% 92%  Weight: 100.2 kg     Height:       Neurology awake and alert ENT with mild pallor. NG tube in place.  Cardiovascular with S1 and S2 present and rhythmic, with no gallops, rubs or murmurs Respiratory with no rales or wheezing, no rhonchi Abdomen with no distention  No lower extremity edema  Central line right IJ.  Data Reviewed:    Family Communication: I spoke with patient's father at the bedside, we talked in detail about patient's condition, plan of care and prognosis and all questions were addressed.   Disposition: Status is: Inpatient Remains inpatient appropriate because: recovering post op aortic dissection   Planned Discharge Destination: Home     Author: Coralie Keens, MD 02/13/2023 3:06 PM  For on call review [email or web address].

## 2023-02-13 NOTE — Progress Notes (Signed)
Nutrition Follow-up  DOCUMENTATION CODES:   Non-severe (moderate) malnutrition in context of acute illness/injury  INTERVENTION:   Discussed nutrition plan with General Surgery PA, Dr. Ella Jubilee and Neysa Bonito RN. Plan to advance diet to [facility] today and encourage po. TPN being titrated off. Plan to continue Cortrak for now with EN.   Trial FL diet with Ensure Enlive po BID, each supplement provides 350 kcal and 20 grams of protein.  Tube Feeding via Post-Pyloric Cortrak (tip in descending duodenum): Decrease Vital 1.5 to 40 mL/hr (960 mL in 24 hours) Continue Pro-Source TF20 60 mL daily This provides 1520 kcals, 85 g of protein and 730 mL of free water. Meets 100% protein needs, 80% calorie needs and less free water. No additional free water flushes other than what is needed for tube maintenance.   Add Renal MVI back now that no longer receiving MVI via TPN  NUTRITION DIAGNOSIS:   Moderate Malnutrition related to acute illness as evidenced by energy intake < or equal to 50% for > or equal to 5 days, mild muscle depletion, moderate muscle depletion.  Being addressed via diet advancement, TF, MVI  GOAL:   Patient will meet greater than or equal to 90% of their needs  Progressing  MONITOR:   Diet advancement, TF tolerance, Labs, Weight trends  REASON FOR ASSESSMENT:   Rounds    ASSESSMENT:   57 yo female admitted with Type B aortic dissection, AKI on CKD 4-progressed to ESRD and iHD initiated. PMH DM, CKD 4, HIV, sarcoidosis, HTN, HLD  4/15 TDC placed 4/16 1st iHD 4/17 2nd iHD, Cortrak placed, trickle TF 4/18 OR: thoracic, mesenteric, infrarenal abdominal angiograms, thoracic endovascular aortic repair 4/19 3rd iHD, TF titrated to 40 ml/hr 4/21 Abd xray with non specific distention of SB and colon, NPO 4/23 CT angio C/A/P with dilated small bowel loops with air-fluid levels with a transition to decompressed/normal distal ileal loops of small bowel suggesting small bowel  obstruction likely related to adhesions. Mild distention of the ascending colon but the rest of the colon is largely decompressed and contains some contrast 4/24 TPN initiated 4/25 Abd xray with persistent distention of SB 4/26 Abd xray with stable dilation of SB loops, Cortrak distal duodenum  Pt remains very weak, deconditioned Pt currently on CL diet. Per RN, pt has not had anything by mouth today due to nausea.   +multiple BMs +significant nausea, refractory to meds. Currently receiving zofran, reglan Repeat abd xray this AM with Radiology report indicating "Persistent small bowel obstruction with persistent dilation of multiple loops of small bowel, not significantly changed from prior study." Surgery indicating maybe some slight improvement, ileus type picture  Cortrak is post pyloric, TF had been held off and on for nausea and abdominal pain. Currently infusing at 50 ml/hr  Noted TPN decreased to half rate over the weekend. Per RN, the plan is to stop the TPN tonight. If unable to tolerate oral diet and/or EN, may need to resume TPN  Phosphorus 2.0 (receiving supplementation)  Weight up to 100.2 kg; last iHD on 4/26; weight 95.8 kg post iHD. Net +4L (?accuracy) since admission. Noted 1 unmeasured urine occurrence in 24 hours, otherwise no urine  Labs: CBGs 219-266 (H), phosphorus 2.0 (L), sodium 128 (L) Meds: ss novolog, reglan, zofran, sodium phosphate, dulcolax suppository  Diet Order:   Diet Order             Diet full liquid Room service appropriate? Yes; Fluid consistency: Thin  Diet effective now  EDUCATION NEEDS:   Education needs have been addressed  Skin:  Skin Assessment: Reviewed RN Assessment  Last BM:  4/28  Height:   Ht Readings from Last 1 Encounters:  01/28/23 5\' 5"  (1.651 m)    Weight:   Wt Readings from Last 1 Encounters:  02/13/23 100.2 kg    Ideal Body Weight:     BMI:  Body mass index is 36.76 kg/m.  Estimated  Nutritional Needs:   Kcal:  1750-1950 kcals  Protein:  85-105 g  Fluid:  1L plus UOP   Romelle Starcher MS, RDN, LDN, CNSC Registered Dietitian 3 Clinical Nutrition RD Pager and On-Call Pager Number Located in [place]

## 2023-02-13 NOTE — Progress Notes (Signed)
PT Cancellation Note  Patient Details Name: [name] MRN: [id number] DOB: 1966/02/28   Cancelled Treatment:    Reason Eval/Treat Not Completed: (P) Medical issues which prohibited therapy (attempt AM, RN defer until after pt gets blood transfusion. Attempt again 1705, pt has second unit of blood beginning transfusion. Unit Secretary notified pt bed hydraulics appear to be broken, needs a new regular Hill-ROM bed ordered and old bed will need to be red tagged and removed for repair.) Will continue efforts next date per PT plan of care as schedule permits.   Dorathy Kinsman Vonna Brabson 02/13/2023, 5:07 PM

## 2023-02-13 NOTE — Progress Notes (Signed)
Patient ID: [name], female   DOB: Jun 28, 1966, 57 y.o.   MRN: [id number] S: Feels well, no complaints or events overnight. O:BP 134/66 (BP Location: Right Arm)   Pulse 70   Temp 98.4 F (36.9 C) (Oral)   Resp (!) 27   Ht 5\' 5"  (1.651 m)   Wt 100.2 kg   LMP 03/31/2014 (LMP Unknown)   SpO2 92%   BMI 36.76 kg/m   Intake/Output Summary (Last 24 hours) at 02/13/2023 1454 Last data filed at 02/13/2023 1300 Gross per 24 hour  Intake 1832.49 ml  Output 0 ml  Net 1832.49 ml   Intake/Output: I/O last 3 completed shifts: In: 2855 [P.O.:60; I.V.:1180.5; NG/GT:1614.5] Out: 0   Intake/Output this shift:  Total I/O In: 120 [P.O.:120] Out: 0  Weight change: 1.5 kg Gen: NAD CVS: RRR Resp:CTA Abd: +BS, soft, NT/nD Ext: no edema  Recent Labs  Lab 02/07/23 0034 02/08/23 0043 02/09/23 0355 02/10/23 0352 02/11/23 0416 02/12/23 0416 02/13/23 0409  NA 130* 127* 132* 130* 131* 129* 128*  K 3.8 4.4 3.2* 3.2* 3.0* 3.8 4.0  CL 93* 88* 92* 91* 96* 98 98  CO2 25 22 28 25 26  21* 20*  GLUCOSE 174* 85 139* 138* 202* 203* 211*  BUN 50* 72* 47* 63* 47* 71* 94*  CREATININE 6.71* 9.05* 7.18* 8.61* 6.03* 7.20* 8.44*  ALBUMIN 2.1* 2.2* 2.3* 2.2*  --   --  2.2*  CALCIUM 8.4* 8.7* 8.2* 8.2* 7.8* 8.1* 8.1*  PHOS 6.3* 8.4* 5.2* 4.8*  --  2.2* 2.0*  AST  --   --  13* 12*  --   --  35  ALT  --   --  8 <5  --   --  20   Liver Function Tests: Recent Labs  Lab 02/09/23 0355 02/10/23 0352 02/13/23 0409  AST 13* 12* 35  ALT 8 <5 20  ALKPHOS 62 59 158*  BILITOT 0.7 0.5 0.5  PROT 6.5 6.5 6.9  ALBUMIN 2.3* 2.2* 2.2*   No results for input(s): "LIPASE", "AMYLASE" in the last 168 hours. No results for input(s): "AMMONIA" in the last 168 hours. CBC: Recent Labs  Lab 02/09/23 1301 02/10/23 0352 02/10/23 1615 02/11/23 0416 02/12/23 0416 02/13/23 0409  WBC 8.4 9.2  --  12.9* 15.8* 13.3*  HGB 7.4* 7.1*   < > 7.6* 7.5* 5.9*  HCT 22.4* 21.3*   < > 23.4* 22.9* 18.6*  MCV 98.7 98.2  --   99.2 100.4* 102.8*  PLT 188 178  --  200 223 183   < > = values in this interval not displayed.   Cardiac Enzymes: No results for input(s): "CKTOTAL", "CKMB", "CKMBINDEX", "TROPONINI" in the last 168 hours. CBG: Recent Labs  Lab 02/12/23 1603 02/12/23 1938 02/12/23 2329 02/13/23 0332 02/13/23 1156  GLUCAP 241* 266* 236* 219* 240*    Iron Studies: No results for input(s): "IRON", "TIBC", "TRANSFERRIN", "FERRITIN" in the last 72 hours. Studies/Results: DG Abd Portable 1V  Result Date: 02/13/2023 CLINICAL DATA:  [id number] SBO (small bowel obstruction) (HCC) [id number] EXAM: PORTABLE ABDOMEN - 1 VIEW COMPARISON:  02/10/2023 FINDINGS: Feeding tube tip overlies the distal duodenum, unchanged. Previous nasogastric tube appears to have been removed. Partially visualized aortic stent and central venous catheters overlying the right atrium/superior cavoatrial junction. Persistent dilation of multiple loops of small bowel, not significantly changed from prior. IMPRESSION: Persistent small bowel obstruction. Feeding tube tip overlies the distal duodenum. Previous nasogastric tube appears to have been removed. Electronically Signed  By: Caprice Renshaw M.D.   On: 02/13/2023 12:18    aspirin  81 mg Per Tube Daily   bisacodyl  10 mg Rectal Q0600   carvedilol  6.25 mg Oral BID WC   Chlorhexidine Gluconate Cloth  6 each Topical Daily   [START ON 02/15/2023] darbepoetin (ARANESP) injection - NON-DIALYSIS  60 mcg Subcutaneous Q Wed-1800   dolutegravir  50 mg Per Tube Daily   emtricitabine-tenofovir AF  1 tablet Per Tube Daily   feeding supplement  237 mL Oral BID BM   feeding supplement (PROSource TF20)  60 mL Per Tube Daily   fluconazole  100 mg Per Tube Daily   heparin injection (subcutaneous)  5,000 Units Subcutaneous Q12H   insulin aspart  0-15 Units Subcutaneous Q4H   levothyroxine  175 mcg Per Tube Q0600   lidocaine  1 patch Transdermal Q24H   multivitamin  1 tablet Per Tube QHS   nystatin  5 mL Oral  QID   mouth rinse  15 mL Mouth Rinse 4 times per day   pantoprazole (PROTONIX) IV  40 mg Intravenous Q24H   rosuvastatin  10 mg Per Tube Daily   sertraline  50 mg Per Tube Daily    BMET    Component Value Date/Time   NA 128 (L) 02/13/2023 0409   NA 142 12/05/2022 0000   K 4.0 02/13/2023 0409   CL 98 02/13/2023 0409   CO2 20 (L) 02/13/2023 0409   GLUCOSE 211 (H) 02/13/2023 0409   BUN 94 (H) 02/13/2023 0409   BUN 44 (A) 12/05/2022 0000   CREATININE 8.44 (H) 02/13/2023 0409   CREATININE 2.72 (H) 07/27/2021 1110   CREATININE 2.74 (H) 03/17/2021 1050   CALCIUM 8.1 (L) 02/13/2023 0409   GFRNONAA 5 (L) 02/13/2023 0409   GFRNONAA 20 (L) 07/27/2021 1110   GFRAA 39 (L) 07/14/2020 1053   GFRAA 33 (L) 08/12/2019 1447   CBC    Component Value Date/Time   WBC 13.3 (H) 02/13/2023 0409   RBC 1.81 (L) 02/13/2023 0409   HGB 5.9 (LL) 02/13/2023 0409   HGB 11.7 (L) 07/27/2021 1110   HCT 18.6 (L) 02/13/2023 0409   PLT 183 02/13/2023 0409   PLT 228 07/27/2021 1110   MCV 102.8 (H) 02/13/2023 0409   MCH 32.6 02/13/2023 0409   MCHC 31.7 02/13/2023 0409   RDW 16.5 (H) 02/13/2023 0409   LYMPHSABS 1.6 12/19/2022 1242   MONOABS 0.7 12/19/2022 1242   EOSABS 0.2 12/19/2022 1242   BASOSABS 0.0 12/19/2022 1242     Assessment/Plan:  New ESRD - s/p RIJ TDC on 01/30/23 and first HD session on 01/31/23 due to uremic symptoms.  Currently on MWF schedule and CLIP for outpatient HD underway.  She is no longer interested in PD and would like to do in-center HD.  Will need AVF/AVG once stabilized per VVS note.  Plan for sometime prior to discharge. 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  Bp well controlled. 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  S/p IV iron x 2 and on ESA. HIV - per primary svc SBO vs ileus -  having bowel movements.  Plan to remove NGT and start  liquid diet per surgery. FEN - tube feeds paused   Hypophosphatemia - hold repletion given ESRD and will follow with advancing diet per surgery. Hyponatremia - UF with HD  and follow response.   Irena Cords, MD [facility]

## 2023-02-13 NOTE — Assessment & Plan Note (Signed)
Continue with levothyroxine  

## 2023-02-13 NOTE — Inpatient Diabetes Management (Signed)
Inpatient Diabetes Program Recommendations  AACE/ADA: New Consensus Statement on Inpatient Glycemic Control (2015)  Target Ranges:  Prepandial:   less than 140 mg/dL      Peak postprandial:   less than 180 mg/dL (1-2 hours)      Critically ill patients:  140 - 180 mg/dL   Lab Results  Component Value Date   GLUCAP 219 (H) 02/13/2023   HGBA1C 6.0 (H) 01/29/2023    Review of Glycemic Control  Latest Reference Range & Units 02/12/23 07:16 02/12/23 11:39 02/12/23 16:03 02/12/23 19:38 02/12/23 23:29 02/13/23 03:32  Glucose-Capillary 70 - 99 mg/dL 409 (H) 811 (H) 914 (H) 266 (H) 236 (H) 219 (H)  (H): Data is abnormally high  Diabetes history: DM2 Outpatient Diabetes medications: Glipizide 10 mg BID Current orders for Inpatient glycemic control: Novolog 0-15 units Q4H, TPN at 70 ml/hr, Vital 1.5 @ 50 ml/hr  Inpatient Diabetes Program Recommendations:    Might consider:  Novolog 3 units tube feed coverage Q4H.  Stop if feeds are held or discontinued.    Will continue to follow while inpatient.  Thank you, Dulce Sellar, MSN, CDCES Diabetes Coordinator Inpatient Diabetes Program [phone] (team pager from 8a-5p)

## 2023-02-13 NOTE — Progress Notes (Signed)
[facility] Progress Note  11 Days Post-Op  Subjective: CC:  Reports multiple non-bloody, loose BMs. Also reports flatus. Her cc in nausea, refractory to zofran. She denies severe abdominal pain. Her sister is at the bedside.   Objective: Vital signs in last 24 hours: Temp:  [97.9 F (36.6 C)-98.4 F (36.9 C)] 98.3 F (36.8 C) (04/29 0857) Pulse Rate:  [65-68] 68 (04/29 0857) Resp:  [17-25] 20 (04/29 0857) BP: (121-142)/(45-56) 135/50 (04/29 0857) SpO2:  [90 %-96 %] 92 % (04/29 0857) Weight:  [100.2 kg] 100.2 kg (04/29 0412) Last BM Date : 02/12/23  Intake/Output from previous day: 04/28 0701 - 04/29 0700 In: 2012.5 [P.O.:60; I.V.:762.1; NG/GT:1190.3] Out: 0  Intake/Output this shift: Total I/O In: 120 [P.O.:120] Out: 0   PE: Gen:  Alert, NAD, pleasant Card:  Regular rate and rhythm, pedal pulses 2+ BL Pulm:  Normal effort, clear to auscultation bilaterally Abd: Soft, overall nontender, mild distention,  incisions C/D/I  NGT removed  Cortrak in L nare, running at 50 this morning Skin: warm and dry, no rashes  Psych: A&Ox3   Lab Results:  Recent Labs    02/12/23 0416 02/13/23 0409  WBC 15.8* 13.3*  HGB 7.5* 5.9*  HCT 22.9* 18.6*  PLT 223 183   BMET Recent Labs    02/12/23 0416 02/13/23 0409  NA 129* 128*  K 3.8 4.0  CL 98 98  CO2 21* 20*  GLUCOSE 203* 211*  BUN 71* 94*  CREATININE 7.20* 8.44*  CALCIUM 8.1* 8.1*   PT/INR No results for input(s): "LABPROT", "INR" in the last 72 hours. CMP     Component Value Date/Time   NA 128 (L) 02/13/2023 0409   NA 142 12/05/2022 0000   K 4.0 02/13/2023 0409   CL 98 02/13/2023 0409   CO2 20 (L) 02/13/2023 0409   GLUCOSE 211 (H) 02/13/2023 0409   BUN 94 (H) 02/13/2023 0409   BUN 44 (A) 12/05/2022 0000   CREATININE 8.44 (H) 02/13/2023 0409   CREATININE 2.72 (H) 07/27/2021 1110   CREATININE 2.74 (H) 03/17/2021 1050   CALCIUM 8.1 (L) 02/13/2023 0409   PROT 6.9 02/13/2023 0409   ALBUMIN 2.2 (L)  02/13/2023 0409   AST 35 02/13/2023 0409   AST 28 07/27/2021 1110   ALT 20 02/13/2023 0409   ALT 19 07/27/2021 1110   ALKPHOS 158 (H) 02/13/2023 0409   BILITOT 0.5 02/13/2023 0409   BILITOT 0.3 07/27/2021 1110   GFRNONAA 5 (L) 02/13/2023 0409   GFRNONAA 20 (L) 07/27/2021 1110   GFRAA 39 (L) 07/14/2020 1053   GFRAA 33 (L) 08/12/2019 1447   Lipase     Component Value Date/Time   LIPASE 51 01/28/2023 2358       Studies/Results: No results found.  Anti-infectives: Anti-infectives (From admission, onward)    Start     Dose/Rate Route Frequency Ordered Stop   02/11/23 1000  fluconazole (DIFLUCAN) tablet 100 mg        100 mg Per Tube Daily 02/10/23 1239 02/18/23 0959   02/10/23 1330  fluconazole (DIFLUCAN) tablet 200 mg        200 mg Per Tube  Once 02/10/23 1239 02/10/23 1838   02/02/23 1915  ceFAZolin (ANCEF) IVPB 2g/100 mL premix        2 g 200 mL/hr over 30 Minutes Intravenous Every 8 hours 02/02/23 1820 02/03/23 0337   02/02/23 1452  ceFAZolin (ANCEF) 2-4 GM/100ML-% IVPB       Note to Pharmacy:  Hudson, Marliss Czar A: cabinet override      02/02/23 1452 02/03/23 0259   02/02/23 1450  ceFAZolin (ANCEF) 3-0.9 GM/100ML-% IVPB  Status:  Discontinued       Note to Pharmacy: Payton Emerald A: cabinet override      02/02/23 1450 02/02/23 1453   02/02/23 1000  dolutegravir ([name]) tablet 50 mg        50 mg Per Tube Daily 02/01/23 2052     02/02/23 1000  emtricitabine-tenofovir AF (DESCOVY) 200-25 MG per tablet 1 tablet        1 tablet Per Tube Daily 02/01/23 2052     01/30/23 1531  ceFAZolin (ANCEF) IVPB 2g/100 mL premix        over 30 Minutes Intravenous Continuous PRN 01/30/23 1537 01/30/23 1531   01/29/23 1115  dolutegravir ([name]) tablet 50 mg  Status:  Discontinued        50 mg Oral Daily 01/29/23 1021 02/01/23 2052   01/29/23 1115  emtricitabine-tenofovir AF (DESCOVY) 200-25 MG per tablet 1 tablet  Status:  Discontinued        1 tablet Oral Daily 01/29/23 1021 02/01/23 2052         Assessment/Plan  SBO vs ileus 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 Karin Lieu - CTA 4/23 shows interval placement of an aortic stent graft without worrisome complicating features, as well as dilated loops of small bowel with transition noted in the distal ileum concerning for SBO.  - WBC 13.3 from 15.8, currently getting 2u RBC for hgb 5.9. no signs of GIB.  - Given history seems more likely to be an ileus. If she has a distal SBO then it is a partial SBO because contrast from CT made it to her colon. - Some pain and nausea, but also having bowel function.  I think it is reasonable to D/C cortrak and allow a liquid diet, I don't see a role for both post-pyloric enteral nutrition and PO nutrition unless there is concern for poor gastric emptying. KUB pendin given severe nausea requiring 3 antiemetics. Pending KUB will make final dietary recommendations. Ongoing intolerance of PO or concern for high grade pSBO may warrant repeat CT abdomen/pelvis in the next couple of days.   FEN - tube feeds paused overnight but now running at 50 mL/hr, also on CLD. VTE - sq heparin ID - none currently   AKI on CKD5 - now ESRD DM HTN HLD HIV Hypothyroidism     LOS: 15 days   I reviewed nursing notes, Consultant vascular notes, hospitalist notes, last 24 h vitals and pain scores, last 48 h intake and output, last 24 h labs and trends, and last 24 h imaging results.  This care required moderate level of medical decision making.   Adam Phenix, PA-C

## 2023-02-13 NOTE — Progress Notes (Signed)
Critical Hgb 5.9 received from lab and conveyed to provider on-call, Dr. Arville Care. Orders received for blood transfusion. Will implement and continue to monitor.

## 2023-02-13 NOTE — Progress Notes (Signed)
Mobility Specialist: Progress Note   02/13/23 1410  Mobility  Activity Transferred to/from [facility] (BLE Exercises)  Level of Assistance Contact guard assist, steadying assist  Assistive Device None  Distance Ambulated (ft) 4 ft (2'x2)  Activity Response Tolerated well  Mobility Referral Yes  $Mobility charge 1 Mobility   Pre-Mobility: 70 HR, 133/48 (72) BP, 94% SpO2 Post-Mobility: 71 HR, 96% SpO2  Pt received in the bed and agreeable to mobility. Assisted pt to [facility] per request, BM successful and assisted with pericare. Deferred ambulation per RN request at this time d/t pt received blood. Completed BLE exercises while sitting EOB with no c/o throughout. Pt sitting EOB after session with call bell and phone in reach.   Edon Hoadley Mobility Specialist Please contact via SecureChat or Rehab office at [phone]

## 2023-02-14 DIAGNOSIS — E039 Hypothyroidism, unspecified: Secondary | ICD-10-CM

## 2023-02-14 DIAGNOSIS — I7103 Dissection of thoracoabdominal aorta: Secondary | ICD-10-CM | POA: Diagnosis not present

## 2023-02-14 DIAGNOSIS — K567 Ileus, unspecified: Secondary | ICD-10-CM | POA: Insufficient documentation

## 2023-02-14 DIAGNOSIS — I10 Essential (primary) hypertension: Secondary | ICD-10-CM

## 2023-02-14 DIAGNOSIS — E1169 Type 2 diabetes mellitus with other specified complication: Secondary | ICD-10-CM

## 2023-02-14 DIAGNOSIS — G9340 Encephalopathy, unspecified: Secondary | ICD-10-CM | POA: Diagnosis not present

## 2023-02-14 DIAGNOSIS — B20 Human immunodeficiency virus [HIV] disease: Secondary | ICD-10-CM | POA: Diagnosis not present

## 2023-02-14 DIAGNOSIS — E440 Moderate protein-calorie malnutrition: Secondary | ICD-10-CM

## 2023-02-14 DIAGNOSIS — N179 Acute kidney failure, unspecified: Secondary | ICD-10-CM | POA: Diagnosis not present

## 2023-02-14 DIAGNOSIS — E785 Hyperlipidemia, unspecified: Secondary | ICD-10-CM

## 2023-02-14 HISTORY — DX: Ileus, unspecified: K56.7

## 2023-02-14 LAB — CBC WITH DIFFERENTIAL/PLATELET
Abs Immature Granulocytes: 1.36 10*3/uL — ABNORMAL HIGH (ref 0.00–0.07)
Basophils Absolute: 0.1 10*3/uL (ref 0.0–0.1)
Basophils Relative: 0 %
Eosinophils Absolute: 0.1 10*3/uL (ref 0.0–0.5)
Eosinophils Relative: 0 %
HCT: 27.7 % — ABNORMAL LOW (ref 36.0–46.0)
Hemoglobin: 9.6 g/dL — ABNORMAL LOW (ref 12.0–15.0)
Immature Granulocytes: 8 %
Lymphocytes Relative: 5 %
Lymphs Abs: 0.9 10*3/uL (ref 0.7–4.0)
MCH: 31.8 pg (ref 26.0–34.0)
MCHC: 34.7 g/dL (ref 30.0–36.0)
MCV: 91.7 fL (ref 80.0–100.0)
Monocytes Absolute: 1.6 10*3/uL — ABNORMAL HIGH (ref 0.1–1.0)
Monocytes Relative: 9 %
Neutro Abs: 13.7 10*3/uL — ABNORMAL HIGH (ref 1.7–7.7)
Neutrophils Relative %: 78 %
Platelets: 235 10*3/uL (ref 150–400)
RBC: 3.02 MIL/uL — ABNORMAL LOW (ref 3.87–5.11)
RDW: 17.3 % — ABNORMAL HIGH (ref 11.5–15.5)
WBC: 17.7 10*3/uL — ABNORMAL HIGH (ref 4.0–10.5)
nRBC: 0 % (ref 0.0–0.2)

## 2023-02-14 LAB — GLUCOSE, CAPILLARY
Glucose-Capillary: 161 mg/dL — ABNORMAL HIGH (ref 70–99)
Glucose-Capillary: 163 mg/dL — ABNORMAL HIGH (ref 70–99)
Glucose-Capillary: 165 mg/dL — ABNORMAL HIGH (ref 70–99)
Glucose-Capillary: 177 mg/dL — ABNORMAL HIGH (ref 70–99)
Glucose-Capillary: 179 mg/dL — ABNORMAL HIGH (ref 70–99)
Glucose-Capillary: 196 mg/dL — ABNORMAL HIGH (ref 70–99)
Glucose-Capillary: 220 mg/dL — ABNORMAL HIGH (ref 70–99)

## 2023-02-14 LAB — BASIC METABOLIC PANEL
Anion gap: 10 (ref 5–15)
BUN: 49 mg/dL — ABNORMAL HIGH (ref 6–20)
CO2: 26 mmol/L (ref 22–32)
Calcium: 8 mg/dL — ABNORMAL LOW (ref 8.9–10.3)
Chloride: 95 mmol/L — ABNORMAL LOW (ref 98–111)
Creatinine, Ser: 5.14 mg/dL — ABNORMAL HIGH (ref 0.44–1.00)
GFR, Estimated: 9 mL/min — ABNORMAL LOW (ref >60)
Glucose, Bld: 204 mg/dL — ABNORMAL HIGH (ref 70–99)
Potassium: 3.7 mmol/L (ref 3.5–5.1)
Sodium: 131 mmol/L — ABNORMAL LOW (ref 135–145)

## 2023-02-14 LAB — BPAM RBC
Blood Product Expiration Date: 202405292359
ISSUE DATE / TIME: 202404291503
Unit Type and Rh: 5100
Unit Type and Rh: 5100

## 2023-02-14 LAB — TYPE AND SCREEN
Antibody Screen: NEGATIVE
Unit division: 0
Unit division: 0

## 2023-02-14 LAB — PHOSPHORUS: Phosphorus: 1.9 mg/dL — ABNORMAL LOW (ref 2.5–4.6)

## 2023-02-14 MED ORDER — SODIUM PHOSPHATES 45 MMOLE/15ML IV SOLN
15.0000 mmol | Freq: Once | INTRAVENOUS | Status: AC
  Administered 2023-02-14: 15 mmol via INTRAVENOUS
  Filled 2023-02-14: qty 5

## 2023-02-14 MED ORDER — ENSURE ENLIVE PO LIQD: 237.0000 mL | Freq: Three times a day (TID) | ORAL | Status: DC

## 2023-02-14 MED ORDER — CARVEDILOL 12.5 MG PO TABS
12.5000 mg | ORAL_TABLET | Freq: Two times a day (BID) | ORAL | Status: DC
  Administered 2023-02-15 – 2023-02-17 (×5): 12.5 mg via ORAL
  Filled 2023-02-14 (×5): qty 1

## 2023-02-14 NOTE — Assessment & Plan Note (Signed)
-  SBO versus ileus -Patient with clinical improvement, nausea improved, having bowel movements, passing flatus. -Patient still with poor oral intake and receiving tube feeds which she is tolerating via Cortrack. -Diet was advanced to full liquid diet and has been advanced to a soft diet this morning per general surgery. -Continue current tube feeds until tolerating oral intake adequately prior to discontinuation of tube feeds/Cortrack. -General surgery was following but has signed off as of 02/14/2023.

## 2023-02-14 NOTE — Progress Notes (Signed)
Patient ID: [name], female   DOB: 12/31/1965, 57 y.o.   MRN: [id number] S:No new complaints. O:BP (!) 159/75 (BP Location: Left Arm)   Pulse 72   Temp 98.5 F (36.9 C) (Oral)   Resp 20   Ht 5\' 5"  (1.651 m)   Wt 103.6 kg   LMP 03/31/2014 (LMP Unknown)   SpO2 97%   BMI 38.01 kg/m   Intake/Output Summary (Last 24 hours) at 02/14/2023 1211 Last data filed at 02/14/2023 0930 Gross per 24 hour  Intake 1787.47 ml  Output 2 ml  Net 1785.47 ml   Intake/Output: I/O last 3 completed shifts: In: 2968.2 [P.O.:230; I.V.:446.8; Blood:810; NG/GT:1285.8; IV Piggyback:195.6] Out: 2 [Other:2]  Intake/Output this shift:  Total I/O In: 90 [NG/GT:90] Out: -  Weight change: 3.4 kg Gen:NAD CVS: rrr Resp: CTA Abd: +BS, soft, NT Ext: no edema  Recent Labs  Lab 02/08/23 0043 02/09/23 0355 02/10/23 0352 02/11/23 0416 02/12/23 0416 02/13/23 0409 02/14/23 0635  NA 127* 132* 130* 131* 129* 128* 131*  K 4.4 3.2* 3.2* 3.0* 3.8 4.0 3.7  CL 88* 92* 91* 96* 98 98 95*  CO2 22 28 25 26  21* 20* 26  GLUCOSE 85 139* 138* 202* 203* 211* 204*  BUN 72* 47* 63* 47* 71* 94* 49*  CREATININE 9.05* 7.18* 8.61* 6.03* 7.20* 8.44* 5.14*  ALBUMIN 2.2* 2.3* 2.2*  --   --  2.2*  --   CALCIUM 8.7* 8.2* 8.2* 7.8* 8.1* 8.1* 8.0*  PHOS 8.4* 5.2* 4.8*  --  2.2* 2.0* 1.9*  AST  --  13* 12*  --   --  35  --   ALT  --  8 <5  --   --  20  --    Liver Function Tests: Recent Labs  Lab 02/09/23 0355 02/10/23 0352 02/13/23 0409  AST 13* 12* 35  ALT 8 <5 20  ALKPHOS 62 59 158*  BILITOT 0.7 0.5 0.5  PROT 6.5 6.5 6.9  ALBUMIN 2.3* 2.2* 2.2*   No results for input(s): "LIPASE", "AMYLASE" in the last 168 hours. No results for input(s): "AMMONIA" in the last 168 hours. CBC: Recent Labs  Lab 02/10/23 0352 02/10/23 1615 02/11/23 0416 02/12/23 0416 02/13/23 0409 02/14/23 0635  WBC 9.2  --  12.9* 15.8* 13.3* 17.7*  NEUTROABS  --   --   --   --   --  13.7*  HGB 7.1*   < > 7.6* 7.5* 5.9* 9.6*  HCT  21.3*   < > 23.4* 22.9* 18.6* 27.7*  MCV 98.2  --  99.2 100.4* 102.8* 91.7  PLT 178  --  200 223 183 235   < > = values in this interval not displayed.   Cardiac Enzymes: No results for input(s): "CKTOTAL", "CKMB", "CKMBINDEX", "TROPONINI" in the last 168 hours. CBG: Recent Labs  Lab 02/13/23 1705 02/14/23 0139 02/14/23 0358 02/14/23 0738 02/14/23 1202  GLUCAP 234* 163* 220* 177* 161*    Iron Studies: No results for input(s): "IRON", "TIBC", "TRANSFERRIN", "FERRITIN" in the last 72 hours. Studies/Results: DG Abd Portable 1V  Result Date: 02/13/2023 CLINICAL DATA:  [id number] SBO (small bowel obstruction) (HCC) [id number] EXAM: PORTABLE ABDOMEN - 1 VIEW COMPARISON:  02/10/2023 FINDINGS: Feeding tube tip overlies the distal duodenum, unchanged. Previous nasogastric tube appears to have been removed. Partially visualized aortic stent and central venous catheters overlying the right atrium/superior cavoatrial junction. Persistent dilation of multiple loops of small bowel, not significantly changed from prior. IMPRESSION: Persistent small bowel  obstruction. Feeding tube tip overlies the distal duodenum. Previous nasogastric tube appears to have been removed. Electronically Signed   By: Caprice Renshaw M.D.   On: 02/13/2023 12:18    aspirin  81 mg Per Tube Daily   bisacodyl  10 mg Rectal Q0600   carvedilol  6.25 mg Oral BID WC   Chlorhexidine Gluconate Cloth  6 each Topical Daily   [START ON 02/15/2023] darbepoetin (ARANESP) injection - NON-DIALYSIS  60 mcg Subcutaneous Q Wed-1800   dolutegravir  50 mg Per Tube Daily   emtricitabine-tenofovir AF  1 tablet Per Tube Daily   feeding supplement  237 mL Oral BID BM   feeding supplement (PROSource TF20)  60 mL Per Tube Daily   fluconazole  100 mg Per Tube Daily   heparin injection (subcutaneous)  5,000 Units Subcutaneous Q12H   insulin aspart  0-15 Units Subcutaneous Q4H   levothyroxine  175 mcg Per Tube Q0600   lidocaine  1 patch Transdermal Q24H    multivitamin  1 tablet Per Tube QHS   nystatin  5 mL Oral QID   omeprazole  40 mg Per Tube BID AC   mouth rinse  15 mL Mouth Rinse 4 times per day   rosuvastatin  10 mg Per Tube Daily   sertraline  50 mg Per Tube Daily    BMET    Component Value Date/Time   NA 131 (L) 02/14/2023 0635   NA 142 12/05/2022 0000   K 3.7 02/14/2023 0635   CL 95 (L) 02/14/2023 0635   CO2 26 02/14/2023 0635   GLUCOSE 204 (H) 02/14/2023 0635   BUN 49 (H) 02/14/2023 0635   BUN 44 (A) 12/05/2022 0000   CREATININE 5.14 (H) 02/14/2023 0635   CREATININE 2.72 (H) 07/27/2021 1110   CREATININE 2.74 (H) 03/17/2021 1050   CALCIUM 8.0 (L) 02/14/2023 0635   GFRNONAA 9 (L) 02/14/2023 0635   GFRNONAA 20 (L) 07/27/2021 1110   GFRAA 39 (L) 07/14/2020 1053   GFRAA 33 (L) 08/12/2019 1447   CBC    Component Value Date/Time   WBC 17.7 (H) 02/14/2023 0635   RBC 3.02 (L) 02/14/2023 0635   HGB 9.6 (L) 02/14/2023 0635   HGB 11.7 (L) 07/27/2021 1110   HCT 27.7 (L) 02/14/2023 0635   PLT 235 02/14/2023 0635   PLT 228 07/27/2021 1110   MCV 91.7 02/14/2023 0635   MCH 31.8 02/14/2023 0635   MCHC 34.7 02/14/2023 0635   RDW 17.3 (H) 02/14/2023 0635   LYMPHSABS 0.9 02/14/2023 0635   MONOABS 1.6 (H) 02/14/2023 0635   EOSABS 0.1 02/14/2023 0635   BASOSABS 0.1 02/14/2023 0635    Assessment/Plan:   New ESRD - s/p RIJ TDC on 01/30/23 and first HD session on 01/31/23 due to uremic symptoms.  Currently on MWF schedule and CLIP for outpatient HD underway.  She is no longer interested in PD and would like to do in-center HD.  Will need AVF/AVG once stabilized per [facility]VS note.  Plan for sometime prior to discharge. 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  Bp well controlled. 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  S/p IV iron x 2 and on ESA. HIV - per primary svc SBO vs ileus -  having bowel  movements.  Plan to remove NGT and start liquid diet per surgery. FEN - tube feeds paused  Hypophosphatemia - hold repletion given ESRD and will follow with advancing diet per surgery. Hyponatremia - UF with HD and follow response.   Irena Cords, MD [facility]

## 2023-02-14 NOTE — Progress Notes (Signed)
PROGRESS NOTE    [name]  GNF:[id number] DOB: [birth year] DOA: 01/29/2023 PCP: Doreene Nest, NP    Chief Complaint  Patient presents with   Back Pain   Shortness of Breath   Emesis    Brief Narrative:  [name] was admitted to the hospital with the working diagnosis of type B aortic dissection sp TEVAR.   57 yo female with the past medical history of sarcoidosis, CKD stage 4, and T2DM who presented with back pain, nausea and vomiting. Pain present for the last 2 days prior to hospitalization. On her initial physical examination her systolic blood pressure was >200 and she was started on esmolol drip, blood pressure 169/90, HR 63, RR 13 and 02 saturation 100%, lungs with no wheezing or rales, heart with S1 and S2 present and rhythmic, abdomen with no distention,. No lower extremity edema.   CT renal with findings suggestive of interval development of dissection the infrarenal abdominal aorta likely extending to the left common iliac artery. 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   CT angiography:  Type B acute aortic dissection arising in the proximal descending thoracic aorta distal to the origin of the left subclavian artery. There is a windsock sign in the mid descending thoracic aorta compatible with intimointimal intussusception. The dissection flap extends into the abdominal aorta, right common iliac artery, right external iliac artery, and left common femoral artery terminating at the bifurcation into the superficial and profunda femoral arteries.  The left internal iliac artery is not opacified likely occluded.   The celiac axis, left renal artery, and IMA arise from the true lumen. The right renal artery and probably the SMA arise from the false lumen.  Admitted to hospital 4/14, on cardene and esmolol 4/15 TDC placement, uremic encephalopathy 4/18 CT angio c/a/b, AO bifem. TEVAR  4/19 AKI on  CKD requiring RRT  4/20 onward developed ileus vs SBO and intermittent pressor need 4/26 tolerating tube feeds and clear liquids  04/28 transferred to [facility].  04/29 discontinue TPN, advanced po diet, continue with tube feedings for now.     Assessment & Plan:  Principal Problem:   Aortic dissection (HCC) Active Problems:   AKI (acute kidney injury) (HCC)   Encephalopathy acute   Hypertension   Malnutrition of moderate degree (HCC)   HIV (human immunodeficiency virus infection) (HCC)   Type 2 diabetes mellitus with hyperlipidemia (HCC)   Hypothyroidism   Ileus (HCC)    Assessment and Plan: * Aortic dissection (HCC) Type B dissection s/p TEVAR (4/18) proximal descending thoracic aorta distal to origin of L subclav to iliacs, extending LLE to bifurcation of superficial and profunda fem arteries  Hx HTN  Systolic blood pressure 140 to 170 mmHg.   Increase carvedilol to 12.5 mg twice daily with goal systolic blood pressure < 120s.   Post operative ileus, small bowel obstruction, clinically improving.  Diet advanced today to soft diet per general surgery.   -Patient however with poor oral intake we will continue tube feedings for now.  -TPN has been discontinued. -Will need outpatient follow-up with vascular surgery.  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 Hypokalemia/ pseudohyponatremia/hypophosphatemia.   She has temporary dialysis catheter placed on and started on HD in 04/16.   -Patient currently on Monday Wednesday Friday schedule and cleared process began for outpatient HD. -It is noted per nephrology  patient no longer interested in PD and would like to do in center HD. -Mental status improved with hemodialysis.  -Phosphorus at 1.9 and being repleted IV.  Urinary retention, foley cathter in place.   Anemia of chronic renal disease. Post operative anemia,   sp PRBC transfusion 04.26 Iron deficiency  anemia sp IV iron.  Hemoglobin currently at 9.6 from 5.9.   -No signs of active bleeding.    Continue with EPO.   Encephalopathy acute -Likely secondary to uremia.   -Significant clinical improvement/clinical resolution.    Swallow dysfunction, she has cortrack in placed for tube feedings.  Dysphagia is improving.  Continue nutritional support.   Depression, continue with sertraline.   Hypertension -Increase carvedilol to 12.5 mg twice daily for better blood pressure control.    Malnutrition of moderate degree (HCC) Continue nutritional support.  -TPN discontinued. -Diet advanced to a soft diet today, however patient with poor oral intake and as such we will continue tube feeds via Cortrack until patient with adequate oral intake.  HIV (human immunodeficiency virus infection) (HCC) -Continue with antiretroviral therapy, -Noted to be positive for thrush and started on Diflucan which we will continue for now.  -Continue nystatin swish and swallow.  Type 2 diabetes mellitus with hyperlipidemia (HCC) -Hemoglobin A1c 6.0  (01/29/2023 ). -CBG 177 this morning.   -Continue SSI.    -Continue statin.   Hypothyroidism -Levothyroxine.   Ileus (HCC) -SBO versus ileus -Patient with clinical improvement, nausea improved, having bowel movements, passing flatus. -Patient still with poor oral intake and receiving tube feeds which she is tolerating via Cortrack. -Diet was advanced to full liquid diet and has been advanced to a soft diet this morning per general surgery. -Continue current tube feeds until tolerating oral intake adequately prior to discontinuation of tube feeds/Cortrack. -General surgery was following but has signed off as of 02/14/2023.         DVT prophylaxis: Heparin Code Status: Full Family Communication: Updated sister at bedside.  Disposition: TBD  Status is: Inpatient Remains inpatient appropriate because: Severity of illness   Consultants:  Vascular  surgery: Dr. Karin Lieu 01/29/2024 Nephrology: Dr. Glenna Fellows 01/29/2024 Interventional radiology: Dr. Lowella Dandy 01/30/2024 General surgery: Dr. Cliffton Asters 02/07/2024  Procedures:  CT angiogram chest abdomen and pelvis 01/29/2023, 01/31/2023, 02/07/2023 Abdominal films 02/05/2023, 02/07/2023, 02/08/2023, 02/09/2023, 02/10/2023, 02/13/2023 Chest x-ray 02/01/2023, 02/07/2023 02/08/2023 MRI brain 02/01/2023 Ultrasound guidance for vascular access right internal jugular permanent hemodialysis catheter placement by IR: Dr. Miles Costain 01/30/2023 2D echo 01/30/2023 Upper extremity vein mapping/Dopplers 02/09/2023 Thoracic, mesenteric, infrarenal abdominal angiograms intravascular ultrasound, thoracic endovascular aortic repair 45 x 20 cm, 45 x 15 cm Gore excluder per vascular surgery: Dr. Karin Lieu 02/02/2024  Antimicrobials:  Anti-infectives (From admission, onward)    Start     Dose/Rate Route Frequency Ordered Stop   02/11/23 1000  fluconazole (DIFLUCAN) tablet 100 mg        100 mg Per Tube Daily 02/10/23 1239 02/18/23 0959   02/10/23 1330  fluconazole (DIFLUCAN) tablet 200 mg        200 mg Per Tube  Once 02/10/23 1239 02/10/23 1838   02/02/23 1915  ceFAZolin (ANCEF) IVPB 2g/100 mL premix        2 g 200 mL/hr over 30 Minutes Intravenous Every 8 hours 02/02/23 1820 02/03/23 0337   02/02/23 1452  ceFAZolin (ANCEF) 2-4 GM/100ML-% IVPB       Note to Pharmacy: Payton Emerald A: cabinet override      02/02/23 1452 02/03/23 0259   02/02/23  1450  ceFAZolin (ANCEF) 3-0.9 GM/100ML-% IVPB  Status:  Discontinued       Note to Pharmacy: Payton Emerald A: cabinet override      02/02/23 1450 02/02/23 1453   02/02/23 1000  dolutegravir ([name]) tablet 50 mg        50 mg Per Tube Daily 02/01/23 2052     02/02/23 1000  emtricitabine-tenofovir AF (DESCOVY) 200-25 MG per tablet 1 tablet        1 tablet Per Tube Daily 02/01/23 2052     01/30/23 1531  ceFAZolin (ANCEF) IVPB 2g/100 mL premix        over 30 Minutes Intravenous Continuous PRN 01/30/23  1537 01/30/23 1531   01/29/23 1115  dolutegravir ([name]) tablet 50 mg  Status:  Discontinued        50 mg Oral Daily 01/29/23 1021 02/01/23 2052   01/29/23 1115  emtricitabine-tenofovir AF (DESCOVY) 200-25 MG per tablet 1 tablet  Status:  Discontinued        1 tablet Oral Daily 01/29/23 1021 02/01/23 2052         Subjective: Laying in bed.  Sister at bedside.  Still with poor oral intake.  Denies any chest pain or shortness of breath.  No abdominal pain.  Patient passing flatus.  Patient having bowel movement.  Objective: Vitals:   02/14/23 0134 02/14/23 0739 02/14/23 1203 02/14/23 1631  BP: (!) 179/92  (!) 159/75 (!) 145/60  Pulse: 79  72 66  Resp: 18  20 (!) 21  Temp: 98.4 F (36.9 C) 99 F (37.2 C) 98.5 F (36.9 C) 98.4 F (36.9 C)  TempSrc: Oral Oral Oral Oral  SpO2: 92%  97% 93%  Weight:      Height:        Intake/Output Summary (Last 24 hours) at 02/14/2023 2017 Last data filed at 02/14/2023 1729 Gross per 24 hour  Intake 343.86 ml  Output 2 ml  Net 341.86 ml   Filed Weights   02/12/23 0500 02/13/23 0412 02/13/23 2046  Weight: 98.7 kg 100.2 kg 103.6 kg    Examination:  General exam: Appears calm and comfortable .  Cortrack in nares. Respiratory system: Clear to auscultation.  No wheezes, no crackles, no rhonchi.  Fair air movement.  Speaking in full sentences.  Respiratory effort normal. Cardiovascular system: S1 & S2 heard, RRR. No JVD, murmurs, rubs, gallops or clicks. No pedal edema. Gastrointestinal system: Abdomen is nondistended, soft and nontender. No organomegaly or masses felt. Normal bowel sounds heard. Central nervous system: Alert and oriented. No focal neurological deficits. Extremities: Symmetric 5 x 5 power. Skin: No rashes, lesions or ulcers Psychiatry: Judgement and insight appear normal. Mood & affect appropriate.     Data Reviewed:   CBC: Recent Labs  Lab 02/10/23 0352 02/10/23 1615 02/11/23 0416 02/12/23 0416 02/13/23 0409  02/14/23 0635  WBC 9.2  --  12.9* 15.8* 13.3* 17.7*  NEUTROABS  --   --   --   --   --  13.7*  HGB 7.1* 8.0* 7.6* 7.5* 5.9* 9.6*  HCT 21.3* 23.3* 23.4* 22.9* 18.6* 27.7*  MCV 98.2  --  99.2 100.4* 102.8* 91.7  PLT 178  --  200 223 183 235    Basic Metabolic Panel: Recent Labs  Lab 02/09/23 0355 02/10/23 0352 02/11/23 0416 02/12/23 0416 02/13/23 0409 02/14/23 0635  NA 132* 130* 131* 129* 128* 131*  K 3.2* 3.2* 3.0* 3.8 4.0 3.7  CL 92* 91* 96* 98 98 95*  CO2  28 25 26  21* 20* 26  GLUCOSE 139* 138* 202* 203* 211* 204*  BUN 47* 63* 47* 71* 94* 49*  CREATININE 7.18* 8.61* 6.03* 7.20* 8.44* 5.14*  CALCIUM 8.2* 8.2* 7.8* 8.1* 8.1* 8.0*  MG 2.0 1.7 1.7 2.2 2.3  --   PHOS 5.2* 4.8*  --  2.2* 2.0* 1.9*    GFR: Estimated Creatinine Clearance: 14.9 mL/min (A) (by C-G formula based on SCr of 5.14 mg/dL (H)).  Liver Function Tests: Recent Labs  Lab 02/08/23 0043 02/09/23 0355 02/10/23 0352 02/13/23 0409  AST  --  13* 12* 35  ALT  --  8 <5 20  ALKPHOS  --  62 59 158*  BILITOT  --  0.7 0.5 0.5  PROT  --  6.5 6.5 6.9  ALBUMIN 2.2* 2.3* 2.2* 2.2*    CBG: Recent Labs  Lab 02/14/23 0139 02/14/23 0358 02/14/23 0738 02/14/23 1202 02/14/23 1627  GLUCAP 163* 220* 177* 161* 179*     No results found for this or any previous visit (from the past 240 hour(s)).       Radiology Studies: DG Abd Portable 1V  Result Date: 02/13/2023 CLINICAL DATA:  [id number] SBO (small bowel obstruction) (HCC) [id number] EXAM: PORTABLE ABDOMEN - 1 VIEW COMPARISON:  02/10/2023 FINDINGS: Feeding tube tip overlies the distal duodenum, unchanged. Previous nasogastric tube appears to have been removed. Partially visualized aortic stent and central venous catheters overlying the right atrium/superior cavoatrial junction. Persistent dilation of multiple loops of small bowel, not significantly changed from prior. IMPRESSION: Persistent small bowel obstruction. Feeding tube tip overlies the distal duodenum.  Previous nasogastric tube appears to have been removed. Electronically Signed   By: Caprice Renshaw M.D.   On: 02/13/2023 12:18        Scheduled Meds:  aspirin  81 mg Per Tube Daily   bisacodyl  10 mg Rectal Q0600   carvedilol  6.25 mg Oral BID WC   Chlorhexidine Gluconate Cloth  6 each Topical Daily   [START ON 02/15/2023] darbepoetin (ARANESP) injection - NON-DIALYSIS  60 mcg Subcutaneous Q Wed-1800   dolutegravir  50 mg Per Tube Daily   emtricitabine-tenofovir AF  1 tablet Per Tube Daily   feeding supplement  237 mL Oral TID BM   feeding supplement (PROSource TF20)  60 mL Per Tube Daily   fluconazole  100 mg Per Tube Daily   heparin injection (subcutaneous)  5,000 Units Subcutaneous Q12H   insulin aspart  0-15 Units Subcutaneous Q4H   levothyroxine  175 mcg Per Tube Q0600   lidocaine  1 patch Transdermal Q24H   multivitamin  1 tablet Per Tube QHS   nystatin  5 mL Oral QID   omeprazole  40 mg Per Tube BID AC   mouth rinse  15 mL Mouth Rinse 4 times per day   rosuvastatin  10 mg Per Tube Daily   sertraline  50 mg Per Tube Daily   Continuous Infusions:  sodium chloride Stopped (02/10/23 1335)   feeding supplement (VITAL 1.5 CAL) 1,000 mL (02/14/23 1800)   magnesium sulfate bolus IVPB     sodium phosphate 15 mmol in dextrose 5 % 250 mL infusion 43 mL/hr at 02/14/23 1729     LOS: 16 days    Time spent: 45 minutes    Ramiro Harvest, MD [facility]   To contact the attending provider between 7A-7P or the covering provider during after hours 7P-7A, please log into the web site [email or web address] and access using universal [place]  password for that web site. If you do not have the password, please call the hospital operator.  02/14/2023, 8:17 PM

## 2023-02-14 NOTE — Progress Notes (Signed)
Nutrition Follow-up  DOCUMENTATION CODES:   Non-severe (moderate) malnutrition in context of acute illness/injury  INTERVENTION:   Diet advanced to Soft today, pt on FL this AM but did not eat any breakfast. Pt ate jello yesterday, even on CL, due to severe nausea. Recommend continuing supplemental TF via Cortrak until pt demonstrating diet tolerance of solid food with increased intake.   Tube Feeding via Cortrak (Tip in descending duodenum): Vital 1.5 to 40 mL/hr (960 mL in 24 hours) Continue Pro-Source TF20 60 mL daily This provides 1520 kcals, 85 g of protein and 730 mL of free water. Meets 100% protein needs, 80% calorie needs and less free water. No additional free water flushes other than what is needed for tube maintenance.   Pt currently with prn orders for zofran, reglan, simethicone. Encouraged pt and family to ask for meds when symptomatic  Ensure Enlive po TID, each supplement provides 350 kcal and 20 grams of protein.  Phosphorus remains low (1.9 this AM) despite repletion yesterday; recommend continuing to supplement until wdl. Discussed with Dr. Janee Morn and Dr. Arrie Aran and plan for supplementation again today with phosphorus recheck in AM  Continue Renal MVI daily  NUTRITION DIAGNOSIS:   Moderate Malnutrition related to acute illness as evidenced by energy intake < or equal to 50% for > or equal to 5 days, mild muscle depletion, moderate muscle depletion.  Being addressed via supplemental TF, supplements, diet, MVI  GOAL:   Patient will meet greater than or equal to 90% of their needs  Progressing  MONITOR:   PO intake, Supplement acceptance, TF tolerance, Labs, Weight trends  REASON FOR ASSESSMENT:   Rounds    ASSESSMENT:   57 yo female admitted with Type B aortic dissection, AKI on CKD 4-progressed to ESRD and iHD initiated. PMH DM, CKD 4, HIV, sarcoidosis, HTN, HLD  4/15 TDC placed 4/16 1st iHD 4/17 2nd iHD, Cortrak placed, trickle TF 4/18 OR:  thoracic, mesenteric, infrarenal abdominal angiograms, thoracic endovascular aortic repair 4/19 3rd iHD, TF titrated to 40 ml/hr 4/21 Abd xray with non specific distention of SB and colon, NPO 4/23 CT angio C/A/P with dilated small bowel loops with air-fluid levels with a transition to decompressed/normal distal ileal loops of small bowel suggesting small bowel obstruction likely related to adhesions. Mild distention of the ascending colon but the rest of the colon is largely decompressed and contains some contrast 4/24 TPN initiated 4/25 Abd xray with persistent distention of SB 4/26 Abd xray with stable dilation of SB loops, Cortrak distal duodenum 4/29 Diet advanced to FL, ate only jello 4/30 Diet advanced to Soft  Diet advanced to [facility] yesterday, pt indicating she ate only jello yesterday. +nausea, pain. Pt reports she was actually hungry last night (tummy growling) but did not eat.  No vomiting, +watery liquid stool  Pt continues to report back pain in lower back; it is possible that this pain could be related to bowel distention, gas, etc. Pt denies any issues with back prior to admission or even at the beginning of the admission. Noted pt received pain medicine over night for pain 8/10  Pt currently with prn orders for zofran, reglan, simethicone. Encouraged pt and family to ask for meds when symptomatic. Pt likely to eat more once symptoms improve.   Vital 1.5 infusing at 40 ml/hr via post pyloric Cortrak  Last iHD yesterday. Phosphorus 1.9 this AM despite repletion yesterday for Phosphorus 2.0. Pt did receive iHD yesterday, phosphorus supplementation may not have been adequate with  phosphorus removal with iHD.   No wounds noted per RN skin assessment  Labs: sodium 131 (L), phosphorus 1.9 (L), potassium 3.7 (wdl), CBGs 161-240 Meds: dulcolax daily, aranesp, colace prn, tivicay, renal MVI, ss novolog, reglan prn, simethicone prn, omeprazole, nyastatin    Diet Order:   Diet Order              DIET SOFT Room service appropriate? Yes; Fluid consistency: Thin  Diet effective now                   EDUCATION NEEDS:   Education needs have been addressed  Skin:  Skin Assessment: Reviewed RN Assessment  Last BM:  4/30  Height:   Ht Readings from Last 1 Encounters:  01/28/23 5\' 5"  (1.651 m)    Weight:   Wt Readings from Last 1 Encounters:  02/13/23 103.6 kg    BMI:  Body mass index is 38.01 kg/m.  Estimated Nutritional Needs:   Kcal:  1750-1950 kcals  Protein:  85-105 g  Fluid:  1L plus UOP   Romelle Starcher MS, RDN, LDN, CNSC Registered Dietitian 3 Clinical Nutrition RD Pager and On-Call Pager Number Located in [place]

## 2023-02-14 NOTE — Progress Notes (Signed)
   02/14/23 0052  Pain Assessment  Pain Scale 0-10  Pain Score 8  Pain Type Chronic pain  Pain Location Back (Lower back)  Pain Intervention(s) Repositioned;Medication (See eMAR) (Patient will receive meds on floor)  Neurological  Level of Consciousness Alert  Orientation Level Oriented X4  RUE Motor Strength 5  LUE Motor Strength 5  RLE Motor Strength 5  LLE Motor Strength 5  Respiratory  Respiratory Pattern Regular  Chest Assessment Chest expansion symmetrical  Bilateral Breath Sounds Diminished;Clear  Cardiac  Pulse Regular  Heart Sounds S1, S2  GU Assessment  Genitourinary (WDL) X  Genitourinary Symptoms Anuria   Treatment Duration 3.5 hrs UF Removed 2.0L B/P 188/67; HR 73

## 2023-02-14 NOTE — Progress Notes (Signed)
[facility] Progress Note  12 Days Post-Op  Subjective: CC: lower back pain with sharp exacerbations. BMx3, nonbloody.  Nausea improved compared to yesterday. not eating much. Tolerating TF at 40 mL/hr  Objective: Vital signs in last 24 hours: Temp:  [98.1 F (36.7 C)-99 F (37.2 C)] 99 F (37.2 C) (04/30 0739) Pulse Rate:  [64-79] 79 (04/30 0134) Resp:  [18-28] 18 (04/30 0134) BP: (134-211)/(52-98) 179/92 (04/30 0134) SpO2:  [89 %-95 %] 92 % (04/30 0134) Weight:  [103.6 kg] 103.6 kg (04/29 2046) Last BM Date : 02/13/23  Intake/Output from previous day: 04/29 0701 - 04/30 0700 In: 1817.5 [P.O.:170; I.V.:46; Blood:810; NG/GT:595.8; IV Piggyback:195.6] Out: 2  Intake/Output this shift: No intake/output data recorded.  PE: Gen:  Alert, NAD, pleasant Card:  Regular rate and rhythm, pedal pulses 2+ BL Pulm:  Normal effort, clear to auscultation bilaterally Abd: Soft, overall nontender, mild distention,  incisions C/D/I  Cortrak in L nare, running at 40 this morning  Skin: warm and dry, no rashes; no tenderness over C-spine, T-spine, or L-spine, no paraspinal tenderness. No decubitus ulcers.  Psych: A&Ox3   Lab Results:  Recent Labs    02/13/23 0409 02/14/23 0635  WBC 13.3* 17.7*  HGB 5.9* 9.6*  HCT 18.6* 27.7*  PLT 183 235   BMET Recent Labs    02/13/23 0409 02/14/23 0635  NA 128* 131*  K 4.0 3.7  CL 98 95*  CO2 20* 26  GLUCOSE 211* 204*  BUN 94* 49*  CREATININE 8.44* 5.14*  CALCIUM 8.1* 8.0*   PT/INR No results for input(s): "LABPROT", "INR" in the last 72 hours. CMP     Component Value Date/Time   NA 131 (L) 02/14/2023 0635   NA 142 12/05/2022 0000   K 3.7 02/14/2023 0635   CL 95 (L) 02/14/2023 0635   CO2 26 02/14/2023 0635   GLUCOSE 204 (H) 02/14/2023 0635   BUN 49 (H) 02/14/2023 0635   BUN 44 (A) 12/05/2022 0000   CREATININE 5.14 (H) 02/14/2023 0635   CREATININE 2.72 (H) 07/27/2021 1110   CREATININE 2.74 (H) 03/17/2021 1050    CALCIUM 8.0 (L) 02/14/2023 0635   PROT 6.9 02/13/2023 0409   ALBUMIN 2.2 (L) 02/13/2023 0409   AST 35 02/13/2023 0409   AST 28 07/27/2021 1110   ALT 20 02/13/2023 0409   ALT 19 07/27/2021 1110   ALKPHOS 158 (H) 02/13/2023 0409   BILITOT 0.5 02/13/2023 0409   BILITOT 0.3 07/27/2021 1110   GFRNONAA 9 (L) 02/14/2023 0635   GFRNONAA 20 (L) 07/27/2021 1110   GFRAA 39 (L) 07/14/2020 1053   GFRAA 33 (L) 08/12/2019 1447   Lipase     Component Value Date/Time   LIPASE 51 01/28/2023 2358    Studies/Results: DG Abd Portable 1V  Result Date: 02/13/2023 CLINICAL DATA:  [id number] SBO (small bowel obstruction) (HCC) [id number] EXAM: PORTABLE ABDOMEN - 1 VIEW COMPARISON:  02/10/2023 FINDINGS: Feeding tube tip overlies the distal duodenum, unchanged. Previous nasogastric tube appears to have been removed. Partially visualized aortic stent and central venous catheters overlying the right atrium/superior cavoatrial junction. Persistent dilation of multiple loops of small bowel, not significantly changed from prior. IMPRESSION: Persistent small bowel obstruction. Feeding tube tip overlies the distal duodenum. Previous nasogastric tube appears to have been removed. Electronically Signed   By: Caprice Renshaw M.D.   On: 02/13/2023 12:18    Anti-infectives: Anti-infectives (From admission, onward)    Start     Dose/Rate Route Frequency Ordered Stop  02/11/23 1000  fluconazole (DIFLUCAN) tablet 100 mg        100 mg Per Tube Daily 02/10/23 1239 02/18/23 0959   02/10/23 1330  fluconazole (DIFLUCAN) tablet 200 mg        200 mg Per Tube  Once 02/10/23 1239 02/10/23 1838   02/02/23 1915  ceFAZolin (ANCEF) IVPB 2g/100 mL premix        2 g 200 mL/hr over 30 Minutes Intravenous Every 8 hours 02/02/23 1820 02/03/23 0337   02/02/23 1452  ceFAZolin (ANCEF) 2-4 GM/100ML-% IVPB       Note to Pharmacy: Payton Emerald A: cabinet override      02/02/23 1452 02/03/23 0259   02/02/23 1450  ceFAZolin (ANCEF) 3-0.9 GM/100ML-% IVPB   Status:  Discontinued       Note to Pharmacy: Payton Emerald A: cabinet override      02/02/23 1450 02/02/23 1453   02/02/23 1000  dolutegravir ([name]) tablet 50 mg        50 mg Per Tube Daily 02/01/23 2052     02/02/23 1000  emtricitabine-tenofovir AF (DESCOVY) 200-25 MG per tablet 1 tablet        1 tablet Per Tube Daily 02/01/23 2052     01/30/23 1531  ceFAZolin (ANCEF) IVPB 2g/100 mL premix        over 30 Minutes Intravenous Continuous PRN 01/30/23 1537 01/30/23 1531   01/29/23 1115  dolutegravir ([name]) tablet 50 mg  Status:  Discontinued        50 mg Oral Daily 01/29/23 1021 02/01/23 2052   01/29/23 1115  emtricitabine-tenofovir AF (DESCOVY) 200-25 MG per tablet 1 tablet  Status:  Discontinued        1 tablet Oral Daily 01/29/23 1021 02/01/23 2052        Assessment/Plan  SBO vs ileus 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 Karin Lieu - CTA 4/23 shows interval placement of an aortic stent graft without worrisome complicating features, as well as dilated loops of small bowel with transition noted in the distal ileum concerning for SBO.  - WBC 15 > 13 >17.7 today - ileus vs pSBO is slowly resolving.  No signs of intestinal ischemia. She is having bowel function, nausea is improving. Advance to soft diet. No surgical needs. CCS will sign off. Timing of cortrak removal per primary team/dietician.   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  This care required moderate level of medical decision making.   Adam Phenix, PA-C

## 2023-02-14 NOTE — Progress Notes (Signed)
Working with out-pt HD clinic to confirm pt's out-pt HD schedule at d/c. Will assist as needed.   Olivia Canter Renal Navigator [phone]

## 2023-02-14 NOTE — Progress Notes (Signed)
Occupational Therapy Treatment Patient Details Name: [name] MRN: [id number] DOB: Feb 28, 1966 Today's Date: 02/14/2023   History of present illness 57 y.o. female admitted 4/14 with c/o back and abdominal pain with N&V. 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 4/18 thoracic endovascular aortic repair. 4/23 SBO. PMH: T2DM, CKD, HIV, sarcoidosis   OT comments  Pt progressing towards goals this session, completing LB dressing, toilet, and tub transfers with supervision-min A. Pt supervision for bed mobility. Pt with dizziness upon initial standing/sitting EOB but resolved quickly. Pt presenting with impairments listed below, will follow acutely. Continue to recommend HHOT at d/c.   Recommendations for follow up therapy are one component of a multi-disciplinary discharge planning process, led by the attending physician.  Recommendations may be updated based on patient status, additional functional criteria and insurance authorization.    Assistance Recommended at Discharge Intermittent Supervision/Assistance  Patient can return home with the following  A little help with walking and/or transfers;A lot of help with bathing/dressing/bathroom;Assistance with cooking/housework;Direct supervision/assist for medications management;Direct supervision/assist for financial management;Assist for transportation;Help with stairs or ramp for entrance   Equipment Recommendations  Tub/shower bench;Other (comment) (RW)    Recommendations for Other Services PT consult    Precautions / Restrictions Precautions Precautions: Fall;Other (comment) Precaution Comments: NGT Restrictions Weight Bearing Restrictions: No       Mobility Bed Mobility Overal bed mobility: Needs Assistance Bed Mobility: Supine to Sit     Supine to sit: Supervision          Transfers Overall transfer level: Needs assistance Equipment used: Rolling  walker (2 wheels) Transfers: Sit to/from Stand Sit to Stand: Supervision                 Balance Overall balance assessment: Needs assistance Sitting-balance support: No upper extremity supported, Feet supported Sitting balance-Leahy Scale: Good     Standing balance support: Bilateral upper extremity supported, During functional activity, Reliant on assistive device for balance Standing balance-Leahy Scale: Poor Standing balance comment: reliant on RW support in standing                           ADL either performed or assessed with clinical judgement   ADL Overall ADL's : Needs assistance/impaired                     Lower Body Dressing: Supervision/safety Lower Body Dressing Details (indicate cue type and reason): donning/doffing socks using figure 4 Toilet Transfer: Min guard;Ambulation;Regular Toilet;Rolling walker (2 wheels) Toilet Transfer Details (indicate cue type and reason): simulated via functional mobility     Tub/ Shower Transfer: Min guard;Ambulation;Rolling walker (2 wheels);Tub transfer Tub/Shower Transfer Details (indicate cue type and reason): simulated        Extremity/Trunk Assessment Upper Extremity Assessment Upper Extremity Assessment: Overall WFL for tasks assessed   Lower Extremity Assessment Lower Extremity Assessment: Defer to PT evaluation        Vision   Vision Assessment?: No apparent visual deficits   Perception Perception Perception: Not tested   Praxis Praxis Praxis: Not tested    Cognition Arousal/Alertness: Awake/alert Behavior During Therapy: WFL for tasks assessed/performed Overall Cognitive Status: Within Functional Limits for tasks assessed  General Comments: slowed processing, more alert compared to prior sessoins        Exercises      Shoulder Instructions       General Comments VSS on RA    Pertinent Vitals/ Pain       Pain  Assessment Pain Assessment: No/denies pain  Home Living                                          Prior Functioning/Environment              Frequency  Min 1X/week        Progress Toward Goals  OT Goals(current goals can now be found in the care plan section)  Progress towards OT goals: Progressing toward goals  Acute Rehab OT Goals Patient Stated Goal: none stated OT Goal Formulation: With patient Time For Goal Achievement: 02/19/23 Potential to Achieve Goals: Good ADL Goals Pt Will Perform Upper Body Dressing: with supervision;sitting Pt Will Perform Lower Body Dressing: with supervision;sit to/from stand;sitting/lateral leans Pt Will Transfer to Toilet: with supervision;ambulating;regular height toilet Pt Will Perform Tub/Shower Transfer: Tub transfer;Shower transfer;with supervision;ambulating;rolling walker  Plan Discharge plan remains appropriate;Frequency remains appropriate    Co-evaluation                 AM-PAC OT "6 Clicks" Daily Activity     Outcome Measure   Help from another person eating meals?: A Little Help from another person taking care of personal grooming?: A Little Help from another person toileting, which includes using toliet, bedpan, or urinal?: A Little Help from another person bathing (including washing, rinsing, drying)?: A Lot Help from another person to put on and taking off regular upper body clothing?: A Lot Help from another person to put on and taking off regular lower body clothing?: A Little 6 Click Score: 16    End of Session Equipment Utilized During Treatment: Rolling walker (2 wheels)  OT Visit Diagnosis: Unsteadiness on feet (R26.81);Other abnormalities of gait and mobility (R26.89);Muscle weakness (generalized) (M62.81)   Activity Tolerance Patient tolerated treatment well   Patient Left in bed;with bed alarm set;with call bell/phone within reach;with family/visitor present (seated EOB)    Nurse Communication Mobility status        Time: 1610-9604 OT Time Calculation (min): 14 min  Charges: OT General Charges $OT Visit: 1 Visit OT Treatments $Self Care/Home Management : 8-22 mins  Carver Fila, OTD, OTR/L SecureChat Preferred Acute Rehab [phone]   Carver Fila Koonce 02/14/2023, 3:32 PM

## 2023-02-14 NOTE — Progress Notes (Signed)
PT Cancellation Note  Patient Details Name: [name] MRN: [id number] DOB: May 27, 1966   Cancelled Treatment:    Reason Eval/Treat Not Completed: (P) Other (comment) (pt working with OT, did not have time to reattempt later in the day.) Will continue efforts per PT plan of care as schedule permits.   Dorathy Kinsman Kyan Yurkovich 02/14/2023, 6:35 PM

## 2023-02-15 DIAGNOSIS — N179 Acute kidney failure, unspecified: Secondary | ICD-10-CM | POA: Diagnosis not present

## 2023-02-15 DIAGNOSIS — I7102 Dissection of abdominal aorta: Secondary | ICD-10-CM | POA: Diagnosis not present

## 2023-02-15 LAB — CBC
HCT: 26.7 % — ABNORMAL LOW (ref 36.0–46.0)
HCT: 26.2 % — ABNORMAL LOW (ref 36.0–46.0)
Hemoglobin: 9.1 g/dL — ABNORMAL LOW (ref 12.0–15.0)
Hemoglobin: 8.9 g/dL — ABNORMAL LOW (ref 12.0–15.0)
MCH: 32 pg (ref 26.0–34.0)
MCH: 31.7 pg (ref 26.0–34.0)
MCHC: 34.1 g/dL (ref 30.0–36.0)
MCHC: 34 g/dL (ref 30.0–36.0)
MCV: 94 fL (ref 80.0–100.0)
MCV: 93.2 fL (ref 80.0–100.0)
Platelets: 228 10*3/uL (ref 150–400)
Platelets: 229 10*3/uL (ref 150–400)
RBC: 2.84 MIL/uL — ABNORMAL LOW (ref 3.87–5.11)
RBC: 2.81 MIL/uL — ABNORMAL LOW (ref 3.87–5.11)
RDW: 17.2 % — ABNORMAL HIGH (ref 11.5–15.5)
RDW: 17.2 % — ABNORMAL HIGH (ref 11.5–15.5)
WBC: 13.5 10*3/uL — ABNORMAL HIGH (ref 4.0–10.5)
WBC: 13.8 10*3/uL — ABNORMAL HIGH (ref 4.0–10.5)
nRBC: 0 % (ref 0.0–0.2)
nRBC: 0 % (ref 0.0–0.2)

## 2023-02-15 LAB — RENAL FUNCTION PANEL
Albumin: 2.1 g/dL — ABNORMAL LOW (ref 3.5–5.0)
Albumin: 2.3 g/dL — ABNORMAL LOW (ref 3.5–5.0)
Anion gap: 10 (ref 5–15)
Anion gap: 12 (ref 5–15)
BUN: 71 mg/dL — ABNORMAL HIGH (ref 6–20)
BUN: 79 mg/dL — ABNORMAL HIGH (ref 6–20)
CO2: 27 mmol/L (ref 22–32)
CO2: 25 mmol/L (ref 22–32)
Calcium: 7.9 mg/dL — ABNORMAL LOW (ref 8.9–10.3)
Calcium: 7.8 mg/dL — ABNORMAL LOW (ref 8.9–10.3)
Chloride: 95 mmol/L — ABNORMAL LOW (ref 98–111)
Chloride: 92 mmol/L — ABNORMAL LOW (ref 98–111)
Creatinine, Ser: 6.49 mg/dL — ABNORMAL HIGH (ref 0.44–1.00)
Creatinine, Ser: 7.23 mg/dL — ABNORMAL HIGH (ref 0.44–1.00)
GFR, Estimated: 7 mL/min — ABNORMAL LOW (ref >60)
GFR, Estimated: 6 mL/min — ABNORMAL LOW (ref >60)
Glucose, Bld: 172 mg/dL — ABNORMAL HIGH (ref 70–99)
Glucose, Bld: 205 mg/dL — ABNORMAL HIGH (ref 70–99)
Phosphorus: 4.2 mg/dL (ref 2.5–4.6)
Phosphorus: 4 mg/dL (ref 2.5–4.6)
Potassium: 4.3 mmol/L (ref 3.5–5.1)
Potassium: 4.2 mmol/L (ref 3.5–5.1)
Sodium: 132 mmol/L — ABNORMAL LOW (ref 135–145)
Sodium: 129 mmol/L — ABNORMAL LOW (ref 135–145)

## 2023-02-15 LAB — GLUCOSE, CAPILLARY
Glucose-Capillary: 147 mg/dL — ABNORMAL HIGH (ref 70–99)
Glucose-Capillary: 183 mg/dL — ABNORMAL HIGH (ref 70–99)
Glucose-Capillary: 251 mg/dL — ABNORMAL HIGH (ref 70–99)
Glucose-Capillary: 160 mg/dL — ABNORMAL HIGH (ref 70–99)

## 2023-02-15 LAB — MAGNESIUM: Magnesium: 1.8 mg/dL (ref 1.7–2.4)

## 2023-02-15 MED ORDER — ACETAMINOPHEN 325 MG PO TABS
650.0000 mg | ORAL_TABLET | Freq: Four times a day (QID) | ORAL | Status: DC | PRN
  Administered 2023-02-17 – 2023-02-21 (×7): 650 mg via ORAL
  Filled 2023-02-15 (×9): qty 2

## 2023-02-15 MED ORDER — HEPARIN SODIUM (PORCINE) 1000 UNIT/ML IJ SOLN
3800.0000 [IU] | Freq: Once | INTRAMUSCULAR | Status: AC
  Administered 2023-02-16: 3800 [IU]
  Filled 2023-02-15: qty 4

## 2023-02-15 NOTE — Progress Notes (Signed)
Physical Therapy Treatment Patient Details Name: [name] MRN: [id number] DOB: 05-27-1966 Today's Date: 02/15/2023   History of Present Illness 57 y.o. female admitted 4/14 with c/o back and abdominal pain with N&V. 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 4/18 thoracic endovascular aortic repair. 4/23 SBO. PMH: T2DM, CKD, HIV, sarcoidosis    PT Comments    Pt received in supine, agreeable to therapy session ~60 mins after premedication and placement of heating pad for c/o low back pain. Pt bed hydraulics noted to be malfunctioning with end of bed much lower than it is supposed to be, RN notified (has been an issue >1 day per pt). Pt reminded of HOB >30* precs while tube feeds running, pt needing up to min guard for safety for transfers/gait and stair training at bedside. Pt too fatigued for hallway distance gait trial and needing bathroom so up on toilet at end of session, staff aware, pt requesting >5 mins, pt oriented and agreeable to pull wall bell when finished. Pt continues to benefit from PT services to progress toward functional mobility goals.    Recommendations for follow up therapy are one component of a multi-disciplinary discharge planning process, led by the attending physician.  Recommendations may be updated based on patient status, additional functional criteria and insurance authorization.  Follow Up Recommendations       Assistance Recommended at Discharge Intermittent Supervision/Assistance  Patient can return home with the following A little help with walking and/or transfers;A little help with bathing/dressing/bathroom;Assistance with cooking/housework;Assist for transportation;Help with stairs or ramp for entrance   Equipment Recommendations  Rolling walker (2 wheels);BSC/3in1 (pending progress may or may not need 3in1)    Recommendations for Other Services       Precautions / Restrictions  Precautions Precautions: Fall;Other (comment) Precaution Comments: cortrak (reinforced precs with pt that HOB needs to be >30*, she states she was unaware) Restrictions Weight Bearing Restrictions: No     Mobility  Bed Mobility Overal bed mobility: Needs Assistance Bed Mobility: Rolling, Sidelying to Sit Rolling: Modified independent (Device/Increase time) Sidelying to sit: Supervision       General bed mobility comments: cues for log roll for decreased lower back pain as she states pain has been higher    Transfers Overall transfer level: Needs assistance Equipment used: Rolling walker (2 wheels) Transfers: Sit to/from Stand Sit to Stand: Supervision           General transfer comment: cues for hand placement and position at bed/toilet    Ambulation/Gait Ambulation/Gait assistance: Min guard Gait Distance (Feet): 55 Feet Assistive device: Rolling walker (2 wheels) Gait Pattern/deviations: Step-through pattern, Decreased stride length Gait velocity: variable     General Gait Details: pt limited due to c/o gas pain and need for BM, short distance in room and stair training only. RN aware pt up on toilet with IV pole needs assist at end of session, pt agreeable to pull wall bell when finished.   Stairs Stairs: Yes Stairs assistance: Min guard Stair Management: Two rails, Step to pattern, Forwards Number of Stairs: 10 General stair comments: RW and 7" step in room to simulate stairwell with bil rails, min cues for activity pacing and pursed-lip breathing. no LOB or buckling.   Wheelchair Mobility    Modified Rankin (Stroke Patients Only)       Balance Overall balance assessment: Needs assistance Sitting-balance support: No upper extremity supported, Feet supported Sitting  balance-Leahy Scale: Good     Standing balance support: Bilateral upper extremity supported, During functional activity, Reliant on assistive device for balance, Single extremity  supported Standing balance-Leahy Scale: Fair Standing balance comment: fair static standing and balance while using RW fair to good; limited assessment without BUE support briefly                            Cognition Arousal/Alertness: Awake/alert Behavior During Therapy: WFL for tasks assessed/performed Overall Cognitive Status: Within Functional Limits for tasks assessed                                 General Comments: drowsy initially but agreeable to therapy session after being premedicated ~1 hour prior to session (initial refusal until meds kicked in). Pleasantly cooperative during session.        Exercises      General Comments General comments (skin integrity, edema, etc.): pt bed hydraulics appear to be broken, pt was aware, RN notified, bed needs to be tagged and removed from her room; BP 151/79 (97) HR 70 bpm sitting EOB SpO2 WFL      Pertinent Vitals/Pain Pain Assessment Pain Assessment: 0-10 Faces Pain Scale: Hurts even more Pain Location: lower back Pain Descriptors / Indicators: Discomfort, Grimacing, Sore Pain Intervention(s): Limited activity within patient's tolerance, Monitored during session, Premedicated before session, Repositioned, Heat applied (pt requesting K-pad, RN ordered her one, small heating pack placed until this arrives; premedicated ~60 mins prior to session)    Home Living                          Prior Function            PT Goals (current goals can now be found in the care plan section) Acute Rehab PT Goals Patient Stated Goal: to go home PT Goal Formulation: With patient Time For Goal Achievement: 02/18/23 Progress towards PT goals: Progressing toward goals    Frequency    Min 1X/week      PT Plan Current plan remains appropriate       AM-PAC PT "6 Clicks" Mobility   Outcome Measure  Help needed turning from your back to your side while in a flat bed without using bedrails?: None Help  needed moving from lying on your back to sitting on the side of a flat bed without using bedrails?: A Little Help needed moving to and from a bed to a chair (including a wheelchair)?: A Little Help needed standing up from a chair using your arms (e.g., wheelchair or bedside chair)?: A Little Help needed to walk in hospital room?: A Little Help needed climbing 3-5 steps with a railing? : A Little 6 Click Score: 19    End of Session Equipment Utilized During Treatment: Gait belt Activity Tolerance: Patient tolerated treatment well Patient left: in chair;with call bell/phone within reach;Other (comment) (in bathroom, RN and [place] aware, pt to pull wall bell when done, she needs >5 mins due to BM) Nurse Communication: Mobility status;Other (comment) (needs a new bed) PT Visit Diagnosis: Difficulty in walking, not elsewhere classified (R26.2);Muscle weakness (generalized) (M62.81);Pain     Time: 1426 (initially in [phone] (initially out 1341) PT Time Calculation (min) (ACUTE ONLY): 12 min +5 mins (17 total mins)   Charges:  $Gait Training: 8-22 mins  Florina Ou., PTA Acute Rehabilitation Services Secure Chat Preferred 9a-5:30pm Office: [phone]    Dorathy Kinsman [facility] 02/15/2023, 6:29 PM

## 2023-02-15 NOTE — Progress Notes (Signed)
Patient ID: [name], female   DOB: [birth year], 57 y.o.   MRN: [id number] S: No new complaints. O:BP (!) 138/52 (BP Location: Right Arm)   Pulse 72   Temp 99.1 F (37.3 C) (Oral)   Resp (!) 22   Ht 5\' 5"  (1.651 m)   Wt 103.6 kg   LMP 03/31/2014 (LMP Unknown)   SpO2 92%   BMI 38.01 kg/m   Intake/Output Summary (Last 24 hours) at 02/15/2023 1226 Last data filed at 02/14/2023 2000 Gross per 24 hour  Intake 253.86 ml  Output --  Net 253.86 ml   Intake/Output: I/O last 3 completed shifts: In: 393.9 [P.O.:100; NG/GT:191.3; IV Piggyback:102.5] Out: 2 [Other:2]  Intake/Output this shift:  No intake/output data recorded. Weight change:  Gen: NAD CVS: RRR Resp: CTA Abd: +BS, soft, NT/ND Ext: no edema  Recent Labs  Lab 02/09/23 0355 02/10/23 0352 02/11/23 0416 02/12/23 0416 02/13/23 0409 02/14/23 0635 02/15/23 0540  NA 132* 130* 131* 129* 128* 131* 132*  K 3.2* 3.2* 3.0* 3.8 4.0 3.7 4.3  CL 92* 91* 96* 98 98 95* 95*  CO2 28 25 26  21* 20* 26 27  GLUCOSE 139* 138* 202* 203* 211* 204* 172*  BUN 47* 63* 47* 71* 94* 49* 71*  CREATININE 7.18* 8.61* 6.03* 7.20* 8.44* 5.14* 6.49*  ALBUMIN 2.3* 2.2*  --   --  2.2*  --  2.1*  CALCIUM 8.2* 8.2* 7.8* 8.1* 8.1* 8.0* 7.9*  PHOS 5.2* 4.8*  --  2.2* 2.0* 1.9* 4.2  AST 13* 12*  --   --  35  --   --   ALT 8 <5  --   --  20  --   --    Liver Function Tests: Recent Labs  Lab 02/09/23 0355 02/10/23 0352 02/13/23 0409 02/15/23 0540  AST 13* 12* 35  --   ALT 8 <5 20  --   ALKPHOS 62 59 158*  --   BILITOT 0.7 0.5 0.5  --   PROT 6.5 6.5 6.9  --   ALBUMIN 2.3* 2.2* 2.2* 2.1*   No results for input(s): "LIPASE", "AMYLASE" in the last 168 hours. No results for input(s): "AMMONIA" in the last 168 hours. CBC: Recent Labs  Lab 02/11/23 0416 02/12/23 0416 02/13/23 0409 02/14/23 0635 02/15/23 0540  WBC 12.9* 15.8* 13.3* 17.7* 13.5*  NEUTROABS  --   --   --  13.7*  --   HGB 7.6* 7.5* 5.9* 9.6* 9.1*  HCT 23.4* 22.9* 18.6*  27.7* 26.7*  MCV 99.2 100.4* 102.8* 91.7 94.0  PLT 200 223 183 235 228   Cardiac Enzymes: No results for input(s): "CKTOTAL", "CKMB", "CKMBINDEX", "TROPONINI" in the last 168 hours. CBG: Recent Labs  Lab 02/14/23 1627 02/14/23 2100 02/14/23 2353 02/15/23 0531 02/15/23 0750  GLUCAP 179* 196* 165* 147* 183*    Iron Studies: No results for input(s): "IRON", "TIBC", "TRANSFERRIN", "FERRITIN" in the last 72 hours. Studies/Results: No results found.  aspirin  81 mg Per Tube Daily   bisacodyl  10 mg Rectal Q0600   carvedilol  12.5 mg Oral BID WC   Chlorhexidine Gluconate Cloth  6 each Topical Daily   darbepoetin (ARANESP) injection - NON-DIALYSIS  60 mcg Subcutaneous Q Wed-1800   dolutegravir  50 mg Per Tube Daily   emtricitabine-tenofovir AF  1 tablet Per Tube Daily   feeding supplement  237 mL Oral TID BM   feeding supplement (PROSource TF20)  60 mL Per Tube Daily   fluconazole  100 mg Per Tube Daily   heparin injection (subcutaneous)  5,000 Units Subcutaneous Q12H   insulin aspart  0-15 Units Subcutaneous Q4H   levothyroxine  175 mcg Per Tube Q0600   lidocaine  1 patch Transdermal Q24H   multivitamin  1 tablet Per Tube QHS   nystatin  5 mL Oral QID   omeprazole  40 mg Per Tube BID AC   mouth rinse  15 mL Mouth Rinse 4 times per day   rosuvastatin  10 mg Per Tube Daily   sertraline  50 mg Per Tube Daily    BMET    Component Value Date/Time   NA 132 (L) 02/15/2023 0540   NA 142 12/05/2022 0000   K 4.3 02/15/2023 0540   CL 95 (L) 02/15/2023 0540   CO2 27 02/15/2023 0540   GLUCOSE 172 (H) 02/15/2023 0540   BUN 71 (H) 02/15/2023 0540   BUN 44 (A) 12/05/2022 0000   CREATININE 6.49 (H) 02/15/2023 0540   CREATININE 2.72 (H) 07/27/2021 1110   CREATININE 2.74 (H) 03/17/2021 1050   CALCIUM 7.9 (L) 02/15/2023 0540   GFRNONAA 7 (L) 02/15/2023 0540   GFRNONAA 20 (L) 07/27/2021 1110   GFRAA 39 (L) 07/14/2020 1053   GFRAA 33 (L) 08/12/2019 1447   CBC    Component Value  Date/Time   WBC 13.5 (H) 02/15/2023 0540   RBC 2.84 (L) 02/15/2023 0540   HGB 9.1 (L) 02/15/2023 0540   HGB 11.7 (L) 07/27/2021 1110   HCT 26.7 (L) 02/15/2023 0540   PLT 228 02/15/2023 0540   PLT 228 07/27/2021 1110   MCV 94.0 02/15/2023 0540   MCH 32.0 02/15/2023 0540   MCHC 34.1 02/15/2023 0540   RDW 17.2 (H) 02/15/2023 0540   LYMPHSABS 0.9 02/14/2023 0635   MONOABS 1.6 (H) 02/14/2023 0635   EOSABS 0.1 02/14/2023 0635   BASOSABS 0.1 02/14/2023 0635     Assessment/Plan:   New ESRD - s/p RIJ TDC on 01/30/23 and first HD session on 01/31/23 due to uremic symptoms.  Currently on MWF schedule and CLIP for outpatient HD underway.  She is no longer interested in PD and would like to do in-center HD.  Will need AVF/AVG once stabilized per VVS note.  Plan for sometime prior to discharge. 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  Bp well controlled. 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  S/p IV iron x 2 and on ESA. HIV - per primary svc SBO vs ileus -  having bowel movements.  Plan to remove NGT and start liquid diet per surgery. FEN - tube feeds paused   Hypophosphatemia - hold repletion given ESRD and will follow with advancing diet per surgery. Hyponatremia - UF with HD and follow response.   Irena Cords, MD [facility]

## 2023-02-15 NOTE — Progress Notes (Signed)
Progress Note   Patient: [name] ZOX:[id number] DOB: 1966/06/06 DOA: 01/29/2023     17 DOS: the patient was seen and examined on 02/15/2023   Brief hospital course: 57 yo female with the past medical history of sarcoidosis, CKD stage 4, and T2DM who presented with back pain, nausea and vomiting. Pain present for the last 2 days prior to hospitalization. On her initial physical examination her systolic blood pressure was >200 and she was started on esmolol drip, blood pressure 169/90, HR 63, RR 13 and 02 saturation 100%, lungs with no wheezing or rales, heart with S1 and S2 present and rhythmic, abdomen with no distention,. No lower extremity edema.    CT renal with findings suggestive of interval development of dissection the infrarenal abdominal aorta likely extending to the left common iliac artery. 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    CT angiography:  Type B acute aortic dissection arising in the proximal descending thoracic aorta distal to the origin of the left subclavian artery. There is a windsock sign in the mid descending thoracic aorta compatible with intimointimal intussusception. The dissection flap extends into the abdominal aorta, right common iliac artery, right external iliac artery, and left common femoral artery terminating at the bifurcation into the superficial and profunda femoral arteries.   The left internal iliac artery is not opacified likely occluded.    The celiac axis, left renal artery, and IMA arise from the true lumen. The right renal artery and probably the SMA arise from the false lumen.   Admitted to hospital 4/14, on cardene and esmolol 4/15 TDC placement, uremic encephalopathy 4/18 CT angio c/a/b, AO bifem. TEVAR  4/19 AKI on CKD requiring RRT  4/20 onward developed ileus vs SBO and intermittent pressor need 4/26 tolerating tube feeds and clear liquids   04/28 transferred to  [facility].  04/29 discontinue TPN, advanced po diet, continue with tube feedings for now.      Assessment and Plan:  Aortic dissection (HCC) Type B dissection s/p TEVAR (4/18) proximal descending thoracic aorta distal to origin of L subclav to iliacs, extending LLE to bifurcation of superficial and profunda fem arteries  Hx HTN Systolic blood pressure remains stable Continue carvedilol to 12.5 mg twice daily with goal systolic blood pressure < 120s.    Post operative ileus, small bowel obstruction,  -clinically improving, although pt still not doing well tolerating PO -Diet is being advanced by gen surg -Patient however with poor oral intake we will continue tube feedings for now.  -continued with NG tube feeding per Dietitian. Still with limited PO intake  -Will need outpatient follow-up with vascular surgery.   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 Hypokalemia/ pseudohyponatremia/hypophosphatemia.  She has temporary dialysis catheter placed on and started on HD in 04/16.  Patient currently on Monday Wednesday Friday schedule and cleared process began for outpatient HD. -It is noted per nephrology patient no longer interested in PD and would like to do in center HD. -Per vascular surgery, possible AVF prior to d/c Urinary retention, foley cathter in place.    Anemia of chronic renal disease. Post operative anemia,  sp PRBC transfusion 04.26 Iron deficiency anemia sp IV iron. Hgb remains stable -No signs of active bleeding.   Continue with EPO.    Encephalopathy acute -Likely secondary to uremia.   -Significant clinical improvement/clinical resolution.  Swallow dysfunction,  -she has cortrack in placed for tube feedings.  Dysphagia is improving.  Continue nutritional support.    Depression, continue with sertraline.    Hypertension -Increase carvedilol to 12.5 mg twice daily for better blood pressure control.      Malnutrition of moderate degree (HCC) Continue nutritional support.  -TPN discontinued. -Diet advanced to a soft diet today, however patient continues to have poor oral intake and as such we will continue tube feeds via Cortrack until patient with adequate oral intake. -See note by dietitian    HIV (human immunodeficiency virus infection) (HCC) -Continue with antiretroviral therapy, -Noted to be positive for thrush and started on Diflucan which we will continue for now.  -Continue nystatin swish and swallow.   Type 2 diabetes mellitus with hyperlipidemia (HCC) -Hemoglobin A1c 6.0  (01/29/2023 ). -Continue SSI.   -Continue statin.    Hypothyroidism -Levothyroxine.    Ileus (HCC) -SBO versus ileus -Patient with clinical improvement, nausea improved, having bowel movements, passing flatus. -Patient still with poor oral intake and receiving tube feeds which she is tolerating via Cortrack. -Diet was advanced to full liquid diet and has been advanced to a soft diet this morning per general surgery. -Continue current tube feeds until tolerating oral intake adequately prior to discontinuation of tube feeds/Cortrack. -General surgery was following but has signed off as of 02/14/2023.      Subjective: Still having difficulty tolerating PO, taking only small bites  Physical Exam: Vitals:   02/15/23 0018 02/15/23 0535 02/15/23 0753 02/15/23 1253  BP: (!) 153/57 (!) 148/66 (!) 138/52 (!) 153/65  Pulse: 71  72 66  Resp: 19 20 (!) 22 20  Temp: 98.5 F (36.9 C) 98.4 F (36.9 C) 99.1 F (37.3 C) 98.5 F (36.9 C)  TempSrc: Oral Oral Oral Oral  SpO2: 92% 93% 92% 92%  Weight:      Height:       General exam: Awake, laying in bed, in nad Respiratory system: Normal respiratory effort, no wheezing Cardiovascular system: regular rate, s1, s2 Gastrointestinal system: Soft, obese, NG in place Central nervous system: CN2-12 grossly intact, strength intact Extremities: Perfused, no  clubbing Skin: Normal skin turgor, no notable skin lesions seen Psychiatry: Mood normal // no visual hallucinations   Data Reviewed:  Labs reviewed: Na 129, K 4.2, Cr 7.23  Family Communication: Pt in room, family at bedside  Disposition: Status is: Inpatient Remains inpatient appropriate because: Severity of illness  Planned Discharge Destination: Home    Author: Rickey Barbara, MD 02/15/2023 5:03 PM  For on call review [email or web address].

## 2023-02-15 NOTE — Progress Notes (Signed)
Pt has been accepted at [facility] but pt's schedule is TBD based on pt's d/c date. Will continue to provide update to clinic so schedule and start date can be confirmed closer to pt's d/c date. Message left for pt's sister requesting a return call to provide update. Will assist as needed.   Olivia Canter Renal Navigator [phone]

## 2023-02-15 NOTE — Progress Notes (Signed)
Nutrition Follow-up  DOCUMENTATION CODES:   Non-severe (moderate) malnutrition in context of acute illness/injury  INTERVENTION:   Calorie Count to better assess pt's oral nutrition. Nausea and altered taste continue to be pt's biggest barriers to increased po intake. Discussed soft diet with pt and her daughter; encouraged family to bring in outside food  Trial Magic cup BID with meals, each supplement provides 290 kcal and 9 grams of protein   Recommend continuing supplemental TF via Cortrak until pt demonstrating increased oral intake. Pt currently only eat bites at meals, not taking supplements.    Tube Feeding via Cortrak (Tip in descending duodenum): Vital 1.5 to 40 mL/hr (960 mL in 24 hours) Continue Pro-Source TF20 60 mL daily This provides 1520 kcals, 85 g of protein and 730 mL of free water. Meets 100% protein needs, 80% calorie needs and less free water. No additional free water flushes other than what is needed for tube maintenance.    Pt currently with prn orders for zofran, reglan, simethicone. Encouraged pt and family to ask for meds when symptomatic   Ensure Enlive po TID, each supplement provides 350 kcal and 20 grams of protein. Pt has not tried yet and she is worried she will not tolerate   Continue Renal MVI    NUTRITION DIAGNOSIS:   Moderate Malnutrition related to acute illness as evidenced by energy intake < or equal to 50% for > or equal to 5 days, mild muscle depletion, moderate muscle depletion.  Being addressed  GOAL:   Patient will meet greater than or equal to 90% of their needs  Met via TF, oral intake poor  MONITOR:   PO intake, Supplement acceptance, TF tolerance, Labs, Weight trends  REASON FOR ASSESSMENT:   Rounds    ASSESSMENT:   57 yo female admitted with Type B aortic dissection, AKI on CKD 4-progressed to ESRD and iHD initiated. PMH DM, CKD 4, HIV, sarcoidosis, HTN, HLD  Noted plan for possible discharge to [facility] when  able  Pt eating bites at meals; ate few bites of chicken and bites of mashed potatoes sitting on side of bed before wanting to lie back down. Pt experiencing nausea while trying to eat. Pt received reglan this AM. No vomiting  Pt has not tried Ensure supplements, she is worried she might not tolerate  Noted pt received simethicone x 2 yesterday for gas.   Bowel regimen, anti nausea meds including reglan ordered prn.   Pt continues to report back pain  Pt has been getting out of bed to use restroom, ambulating in hallway  TF continues at goal rate of 40 ml/hr via Cortrak.   Phosphorus improved to normal (4.0) post supplementation yesterday  Labs: sodium 129 (L), phosphorus 4.0 (wdl), potassium 4.2 (wdl) Meds: reviewed  Diet Order:   Diet Order             DIET SOFT Room service appropriate? Yes; Fluid consistency: Thin  Diet effective now                   EDUCATION NEEDS:   Education needs have been addressed  Skin:  Skin Assessment: Reviewed RN Assessment  Last BM:  4/30  Height:   Ht Readings from Last 1 Encounters:  01/28/23 5\' 5"  (1.651 m)    Weight:   Wt Readings from Last 1 Encounters:  02/13/23 103.6 kg    BMI:  Body mass index is 38.01 kg/m.  Estimated Nutritional Needs:   Kcal:  1750-1950 kcals  Protein:  85-105 g  Fluid:  1L plus UOP  Romelle Starcher MS, RDN, LDN, CNSC Registered Dietitian 3 Clinical Nutrition RD Pager and On-Call Pager Number Located in [place]

## 2023-02-15 NOTE — TOC Progression Note (Addendum)
Transition of Care (TOC) - Progression Note  Donn Pierini RN, BSN Transitions of Care Unit 4E- RN Case Manager See Treatment Team for direct phone #   Patient Details  Name: [name] MRN: [id number] Date of Birth: Feb 15, 1966  Transition of Care [facility]) CM/SW Contact  Zenda Alpers Lenn Sink, RN Phone Number: 02/15/2023, 4:22 PM  Clinical Narrative:    Noted referral for LTACH, discussed with attending who agreed that [facility] might be good option for transition for ongoing medical needs. CM reached out to [facility] liaison- however they do not currently have any HD beds to offer in [place], could look in [place] if pt interested. Call made to [facility] liaison who confirmed they do have HD beds available to offer if insurance approves and pt agreeable- pt voiced understanding  CM spoke to pt at bedside, discussed LTACH option for continued medical care and transition needs. Choice offered for both [facility] and [facility]) pt voiced she is not interested in going to [place], and is open to [facility]- pt voiced she would like to speak with her sister and have liaison call and tell them more about [facility]. Contact info confirmed for sister-Share. Pt agreeable to referral to [facility] for possible LTACH- explained that transition would be dependent on insurance approval and bed availability.   CM spoke with [facility] Liaison- [name] and requested her to call pt and sister to provide further information on [facility] LTACH prior to starting insurance auth.   TOC will follow up for possible transition to [facility].    Expected Discharge Plan: Long Term Acute Care (LTAC) Barriers to Discharge: Continued Medical Work up  Expected Discharge Plan and Services   Discharge Planning Services: CM Consult Post Acute Care Choice: Long Term Acute Care (LTAC) Living arrangements for the past 2 months: Single Family Home                                       Social Determinants of Health  (SDOH) Interventions SDOH Screenings   Food Insecurity: No Food Insecurity (02/03/2023)  Housing: Low Risk  (02/03/2023)  Transportation Needs: No Transportation Needs (02/03/2023)  Utilities: Not At Risk (02/03/2023)  Depression (PHQ2-9): Low Risk  (06/09/2022)  Tobacco Use: Medium Risk (02/04/2023)    Readmission Risk Interventions     No data to display

## 2023-02-15 NOTE — Progress Notes (Addendum)
  Progress Note    02/15/2023 7:35 AM 13 Days Post-Op  Subjective:  smiling, states food tastes    Vitals:   02/15/23 0018 02/15/23 0535  BP: (!) 153/57 (!) 148/66  Pulse: 71   Resp: 19 20  Temp: 98.5 F (36.9 C) 98.4 F (36.9 C)  SpO2: 92% 93%   Physical Exam: Cardiac:  regular Lungs:  non labored Extremities:palpable DP pulses bilaterally Neurologic: sleeping  CBC    Component Value Date/Time   WBC 13.5 (H) 02/15/2023 0540   RBC 2.84 (L) 02/15/2023 0540   HGB 9.1 (L) 02/15/2023 0540   HGB 11.7 (L) 07/27/2021 1110   HCT 26.7 (L) 02/15/2023 0540   PLT 228 02/15/2023 0540   PLT 228 07/27/2021 1110   MCV 94.0 02/15/2023 0540   MCH 32.0 02/15/2023 0540   MCHC 34.1 02/15/2023 0540   RDW 17.2 (H) 02/15/2023 0540   LYMPHSABS 0.9 02/14/2023 0635   MONOABS 1.6 (H) 02/14/2023 0635   EOSABS 0.1 02/14/2023 0635   BASOSABS 0.1 02/14/2023 0635    BMET    Component Value Date/Time   NA 132 (L) 02/15/2023 0540   NA 142 12/05/2022 0000   K 4.3 02/15/2023 0540   CL 95 (L) 02/15/2023 0540   CO2 27 02/15/2023 0540   GLUCOSE 172 (H) 02/15/2023 0540   BUN 71 (H) 02/15/2023 0540   BUN 44 (A) 12/05/2022 0000   CREATININE 6.49 (H) 02/15/2023 0540   CREATININE 2.72 (H) 07/27/2021 1110   CREATININE 2.74 (H) 03/17/2021 1050   CALCIUM 7.9 (L) 02/15/2023 0540   GFRNONAA 7 (L) 02/15/2023 0540   GFRNONAA 20 (L) 07/27/2021 1110   GFRAA 39 (L) 07/14/2020 1053   GFRAA 33 (L) 08/12/2019 1447    INR    Component Value Date/Time   INR 1.0 07/14/2019 0312     Intake/Output Summary (Last 24 hours) at 02/15/2023 0735 Last data filed at 02/14/2023 2000 Gross per 24 hour  Intake 343.86 ml  Output --  Net 343.86 ml     Assessment/Plan:  57 y.o. female is s/p TEVAR for TBAD 13 Days Post-Op   Current d/c barrier is PO intake.  I do not have an etiology as to why she is having trouble with it.  She states it feels like food is getting stuck.  Normal voice, formal swallow  evaluation.   Recommend pulling NG and attempting PO intake for 48 hours.   Will hold on HD access until PO intake has improved. Pt scheduled for one month follow up with CTA c/a/p.  Will discuss HD acces at that time.     Victorino Sparrow MD [facility] [phone] 02/15/2023 7:35 AM

## 2023-02-15 NOTE — Progress Notes (Signed)
Mobility Specialist: Progress Note   02/15/23 1216  Mobility  Activity Ambulated with assistance in hallway  Level of Assistance Contact guard assist, steadying assist  Assistive Device Front wheel walker  Distance Ambulated (ft) 220 ft  Activity Response Tolerated well  Mobility Referral Yes  $Mobility charge 1 Mobility   Pre-Mobility: 65 HR, 97% SpO2 Post-Mobility: 68 HR, 94% SpO2  Pt received in the bed and agreeable to mobility. Mod I with bed mobility and contact guard during ambulation. Stopped x2 for standing breaks secondary to fatigue. C/o back pain throughout as well. Pt sitting EOB after session with call bell in reach. Family present in the room.   Bristyn Kulesza Mobility Specialist Please contact via SecureChat or Rehab office at [phone]

## 2023-02-15 NOTE — Plan of Care (Signed)
  Problem: Education: Goal: Knowledge of General Education information will improve Description: Including pain rating scale, medication(s)/side effects and non-pharmacologic comfort measures Outcome: Progressing   Problem: Clinical Measurements: Goal: Respiratory complications will improve Outcome: Progressing   Problem: Activity: Goal: Risk for activity intolerance will decrease Outcome: Progressing   Problem: Coping: Goal: Level of anxiety will decrease Outcome: Progressing   Problem: Pain Managment: Goal: General experience of comfort will improve Outcome: Progressing   Problem: Education: Goal: Knowledge of disease and its progression will improve Outcome: Progressing

## 2023-02-16 DIAGNOSIS — N179 Acute kidney failure, unspecified: Secondary | ICD-10-CM | POA: Diagnosis not present

## 2023-02-16 DIAGNOSIS — I7102 Dissection of abdominal aorta: Secondary | ICD-10-CM | POA: Diagnosis not present

## 2023-02-16 LAB — COMPREHENSIVE METABOLIC PANEL
ALT: 27 U/L (ref 0–44)
AST: 30 U/L (ref 15–41)
Albumin: 2.2 g/dL — ABNORMAL LOW (ref 3.5–5.0)
Alkaline Phosphatase: 159 U/L — ABNORMAL HIGH (ref 38–126)
Anion gap: 10 (ref 5–15)
BUN: 40 mg/dL — ABNORMAL HIGH (ref 6–20)
CO2: 29 mmol/L (ref 22–32)
Calcium: 8.1 mg/dL — ABNORMAL LOW (ref 8.9–10.3)
Chloride: 94 mmol/L — ABNORMAL LOW (ref 98–111)
Creatinine, Ser: 4.39 mg/dL — ABNORMAL HIGH (ref 0.44–1.00)
GFR, Estimated: 11 mL/min — ABNORMAL LOW (ref >60)
Glucose, Bld: 181 mg/dL — ABNORMAL HIGH (ref 70–99)
Potassium: 4.1 mmol/L (ref 3.5–5.1)
Sodium: 133 mmol/L — ABNORMAL LOW (ref 135–145)
Total Bilirubin: 0.7 mg/dL (ref 0.3–1.2)
Total Protein: 6.8 g/dL (ref 6.5–8.1)

## 2023-02-16 LAB — CBC
HCT: 25.4 % — ABNORMAL LOW (ref 36.0–46.0)
Hemoglobin: 8.8 g/dL — ABNORMAL LOW (ref 12.0–15.0)
MCH: 31.9 pg (ref 26.0–34.0)
MCHC: 34.6 g/dL (ref 30.0–36.0)
MCV: 92 fL (ref 80.0–100.0)
Platelets: 209 10*3/uL (ref 150–400)
RBC: 2.76 MIL/uL — ABNORMAL LOW (ref 3.87–5.11)
RDW: 17.2 % — ABNORMAL HIGH (ref 11.5–15.5)
WBC: 14.1 10*3/uL — ABNORMAL HIGH (ref 4.0–10.5)
nRBC: 0 % (ref 0.0–0.2)

## 2023-02-16 LAB — GLUCOSE, CAPILLARY
Glucose-Capillary: 128 mg/dL — ABNORMAL HIGH (ref 70–99)
Glucose-Capillary: 138 mg/dL — ABNORMAL HIGH (ref 70–99)
Glucose-Capillary: 139 mg/dL — ABNORMAL HIGH (ref 70–99)
Glucose-Capillary: 164 mg/dL — ABNORMAL HIGH (ref 70–99)
Glucose-Capillary: 170 mg/dL — ABNORMAL HIGH (ref 70–99)
Glucose-Capillary: 212 mg/dL — ABNORMAL HIGH (ref 70–99)

## 2023-02-16 MED ORDER — INSULIN ASPART 100 UNIT/ML IJ SOLN
0.0000 [IU] | Freq: Three times a day (TID) | INTRAMUSCULAR | Status: DC
  Administered 2023-02-17 – 2023-02-18 (×2): 3 [IU] via SUBCUTANEOUS
  Administered 2023-02-18: 2 [IU] via SUBCUTANEOUS
  Administered 2023-02-19: 3 [IU] via SUBCUTANEOUS

## 2023-02-16 MED ORDER — BOOST / RESOURCE BREEZE PO LIQD CUSTOM
1.0000 | Freq: Three times a day (TID) | ORAL | Status: DC
  Administered 2023-02-16 – 2023-02-19 (×4): 1 via ORAL

## 2023-02-16 MED ORDER — INSULIN ASPART 100 UNIT/ML IJ SOLN
0.0000 [IU] | Freq: Every day | INTRAMUSCULAR | Status: DC
  Administered 2023-02-19: 2 [IU] via SUBCUTANEOUS

## 2023-02-16 MED ORDER — HYDRALAZINE HCL 25 MG PO TABS
25.0000 mg | ORAL_TABLET | Freq: Three times a day (TID) | ORAL | Status: DC
  Administered 2023-02-16 – 2023-02-17 (×3): 25 mg via ORAL
  Filled 2023-02-16 (×4): qty 1

## 2023-02-16 NOTE — Progress Notes (Signed)
Pt is transferred out of the floor to HD. Vital signs are stable.   Filiberto Pinks, RN

## 2023-02-16 NOTE — Progress Notes (Signed)
   02/16/23 0005  Pain Assessment  Pain Scale 0-10  Pain Score 0  Hemodialysis Catheter Right Internal jugular Double lumen Permanent (Tunneled)  Placement Date/Time: 01/30/23 1606   Serial / Lot #: [id number]  Expiration Date: 01/09/27  Time Out: Correct patient;Correct site;Correct procedure  Maximum sterile barrier precautions: Hand hygiene;Cap;Mask;Sterile gown;Sterile gloves;Large sterile ...  Site Condition No complications  Blue Lumen Status Flushed;Dead end cap in place;Heparin locked  Red Lumen Status Flushed;Dead end cap in place;Heparin locked  Catheter fill solution Heparin 1000 units/ml  Catheter fill volume (Arterial) 1.9 cc  Catheter fill volume (Venous) 1.9  Dressing Type Transparent  Dressing Status Antimicrobial disc in place;Clean, Dry, Intact  Drainage Description None  Dressing Change Due 02/20/23  Post treatment catheter status Capped and Clamped  Neurological  Level of Consciousness Alert  Orientation Level Oriented X4  Respiratory  Respiratory Pattern Regular;Unlabored  Chest Assessment Chest expansion symmetrical  Bilateral Breath Sounds Clear;Diminished  Cardiac  Pulse Regular  Heart Sounds S1, S2  Jugular Venous Distention (JVD) No  ECG Monitor Yes  Cardiac Rhythm NSR  Vascular  L Radial Pulse +2  GU Assessment  Genitourinary (WDL) X  Genitourinary Symptoms Oliguria  Psychosocial  Psychosocial (WDL) WDL   Received patient in bed to unit. - yes  Alert and oriented. - x 4 Informed consent signed and in chart. - Yes  TX duration:3.5 hrs  Patient tolerated well. - yes Transported back to the room - 4E16 Alert, without acute distress. - yes Hand-off given to patient's nurse. [name] @ 1230  Access used: [facility] Access issues: None, flows freely  Total UF removed: 1.6 Liter Medication(s) given: None Post HD VS: B/P 174/68; HR 64 Post HD weight: 101.6kg   [name] A [name]

## 2023-02-16 NOTE — TOC Progression Note (Signed)
Transition of Care (TOC) - Progression Note  Donn Pierini RN, BSN Transitions of Care Unit 4E- RN Case Manager See Treatment Team for direct phone #   Patient Details  Name: [name] MRN: [id number] Date of Birth: 20-Aug-1966  Transition of Care [facility]) CM/SW Contact  Zenda Alpers, Lenn Sink, RN Phone Number: 02/16/2023, 3:02 PM  Clinical Narrative:    Received msg from [facility] liaison [name]- pt's sisters have completed tour of [facility], and pt/sisters are agreeable to transition to [facility]- Per [name] they can offer bed for either transition today or tomorrow. CM will reach out to Attending MD to see when pt may be medically ready to transition over and follow up with pt.  1430- received call from attending- per conversation waiting on RD to see pt- MD tentatively ready to transition pt today to [facility] pending RD recommendations. [facility] Liaison to let CM know bed assignment and accepting provider.   1500- CM spoke with pt at bedside. Pt confirmed sisters have spoken with her about [facility], however she voiced she just spoke with RD and plan is to remove feeding tube and see how pt eats. Per pt she now does not want to go to [facility] and wants to return home w/ family. Encouraged pt to eat, and CM will continue to follow for transition needs- asked if RD discussed what plan would be if pt did not take in enough calories- pt voiced they did not discuss that.  [facility] liaison notified that pt would not be coming to [facility]- [facility] today- Liaison voiced they would follow and see how pt does over the next several days.   CM explained to pt that insurance may be a barrier to finding [facility] services however CM will continue to follow for transition needs and address that closer to discharge based on progression. Pt voiced understanding.   Expected Discharge Plan: Long Term Acute Care (LTAC) Barriers to Discharge: Continued Medical Work up  Expected Discharge Plan and Services   Discharge  Planning Services: CM Consult Post Acute Care Choice: Long Term Acute Care (LTAC) Living arrangements for the past 2 months: Single Family Home                                       Social Determinants of Health (SDOH) Interventions SDOH Screenings   Food Insecurity: No Food Insecurity (02/03/2023)  Housing: Low Risk  (02/03/2023)  Transportation Needs: No Transportation Needs (02/03/2023)  Utilities: Not At Risk (02/03/2023)  Depression (PHQ2-9): Low Risk  (06/09/2022)  Tobacco Use: Medium Risk (02/04/2023)    Readmission Risk Interventions     No data to display

## 2023-02-16 NOTE — Plan of Care (Signed)

## 2023-02-16 NOTE — Progress Notes (Signed)
Patient ID: [name], female   DOB: 1966-02-06, 57 y.o.   MRN: [id number] S: No complaints O:BP (!) 164/71 (BP Location: Right Arm)   Pulse 67   Temp 98.9 F (37.2 C) (Oral)   Resp (!) 26   Ht 5\' 5"  (1.651 m)   Wt 97.7 kg   LMP 03/31/2014 (LMP Unknown)   SpO2 95%   BMI 35.84 kg/m   Intake/Output Summary (Last 24 hours) at 02/16/2023 1210 Last data filed at 02/16/2023 0600 Gross per 24 hour  Intake 1210 ml  Output 1600 ml  Net -390 ml   Intake/Output: I/O last 3 completed shifts: In: 2240.7 [P.O.:660; NG/GT:1580.7] Out: 1600 [Other:1600]  Intake/Output this shift:  No intake/output data recorded. Weight change:  Gen: NAD CVS: RRR Resp: CTA Abd: +BS, soft, NT/ND Ext: no edema  Recent Labs  Lab 02/10/23 0352 02/11/23 0416 02/12/23 0416 02/13/23 0409 02/14/23 0635 02/15/23 0540 02/15/23 1515 02/16/23 0345  NA 130* 131* 129* 128* 131* 132* 129* 133*  K 3.2* 3.0* 3.8 4.0 3.7 4.3 4.2 4.1  CL 91* 96* 98 98 95* 95* 92* 94*  CO2 25 26 21* 20* 26 27 25 29   GLUCOSE 138* 202* 203* 211* 204* 172* 205* 181*  BUN 63* 47* 71* 94* 49* 71* 79* 40*  CREATININE 8.61* 6.03* 7.20* 8.44* 5.14* 6.49* 7.23* 4.39*  ALBUMIN 2.2*  --   --  2.2*  --  2.1* 2.3* 2.2*  CALCIUM 8.2* 7.8* 8.1* 8.1* 8.0* 7.9* 7.8* 8.1*  PHOS 4.8*  --  2.2* 2.0* 1.9* 4.2 4.0  --   AST 12*  --   --  35  --   --   --  30  ALT <5  --   --  20  --   --   --  27   Liver Function Tests: Recent Labs  Lab 02/10/23 0352 02/13/23 0409 02/15/23 0540 02/15/23 1515 02/16/23 0345  AST 12* 35  --   --  30  ALT <5 20  --   --  27  ALKPHOS 59 158*  --   --  159*  BILITOT 0.5 0.5  --   --  0.7  PROT 6.5 6.9  --   --  6.8  ALBUMIN 2.2* 2.2* 2.1* 2.3* 2.2*   No results for input(s): "LIPASE", "AMYLASE" in the last 168 hours. No results for input(s): "AMMONIA" in the last 168 hours. CBC: Recent Labs  Lab 02/13/23 0409 02/14/23 0635 02/15/23 0540 02/15/23 1516 02/16/23 0345  WBC 13.3* 17.7* 13.5* 13.8*  14.1*  NEUTROABS  --  13.7*  --   --   --   HGB 5.9* 9.6* 9.1* 8.9* 8.8*  HCT 18.6* 27.7* 26.7* 26.2* 25.4*  MCV 102.8* 91.7 94.0 93.2 92.0  PLT 183 235 228 229 209   Cardiac Enzymes: No results for input(s): "CKTOTAL", "CKMB", "CKMBINDEX", "TROPONINI" in the last 168 hours. CBG: Recent Labs  Lab 02/15/23 1724 02/16/23 0040 02/16/23 0404 02/16/23 0724 02/16/23 1153  GLUCAP 160* 139* 170* 138* 212*    Iron Studies: No results for input(s): "IRON", "TIBC", "TRANSFERRIN", "FERRITIN" in the last 72 hours. Studies/Results: No results found.  aspirin  81 mg Per Tube Daily   bisacodyl  10 mg Rectal Q0600   carvedilol  12.5 mg Oral BID WC   Chlorhexidine Gluconate Cloth  6 each Topical Daily   darbepoetin (ARANESP) injection - NON-DIALYSIS  60 mcg Subcutaneous Q Wed-1800   dolutegravir  50 mg Per Tube  Daily   emtricitabine-tenofovir AF  1 tablet Per Tube Daily   feeding supplement  237 mL Oral TID BM   feeding supplement (PROSource TF20)  60 mL Per Tube Daily   fluconazole  100 mg Per Tube Daily   heparin injection (subcutaneous)  5,000 Units Subcutaneous Q12H   insulin aspart  0-15 Units Subcutaneous Q4H   levothyroxine  175 mcg Per Tube Q0600   lidocaine  1 patch Transdermal Q24H   multivitamin  1 tablet Per Tube QHS   nystatin  5 mL Oral QID   omeprazole  40 mg Per Tube BID AC   mouth rinse  15 mL Mouth Rinse 4 times per day   rosuvastatin  10 mg Per Tube Daily   sertraline  50 mg Per Tube Daily    BMET    Component Value Date/Time   NA 133 (L) 02/16/2023 0345   NA 142 12/05/2022 0000   K 4.1 02/16/2023 0345   CL 94 (L) 02/16/2023 0345   CO2 29 02/16/2023 0345   GLUCOSE 181 (H) 02/16/2023 0345   BUN 40 (H) 02/16/2023 0345   BUN 44 (A) 12/05/2022 0000   CREATININE 4.39 (H) 02/16/2023 0345   CREATININE 2.72 (H) 07/27/2021 1110   CREATININE 2.74 (H) 03/17/2021 1050   CALCIUM 8.1 (L) 02/16/2023 0345   GFRNONAA 11 (L) 02/16/2023 0345   GFRNONAA 20 (L) 07/27/2021  1110   GFRAA 39 (L) 07/14/2020 1053   GFRAA 33 (L) 08/12/2019 1447   CBC    Component Value Date/Time   WBC 14.1 (H) 02/16/2023 0345   RBC 2.76 (L) 02/16/2023 0345   HGB 8.8 (L) 02/16/2023 0345   HGB 11.7 (L) 07/27/2021 1110   HCT 25.4 (L) 02/16/2023 0345   PLT 209 02/16/2023 0345   PLT 228 07/27/2021 1110   MCV 92.0 02/16/2023 0345   MCH 31.9 02/16/2023 0345   MCHC 34.6 02/16/2023 0345   RDW 17.2 (H) 02/16/2023 0345   LYMPHSABS 0.9 02/14/2023 0635   MONOABS 1.6 (H) 02/14/2023 0635   EOSABS 0.1 02/14/2023 0635   BASOSABS 0.1 02/14/2023 0635     Assessment/Plan:   New ESRD - s/p RIJ TDC on 01/30/23 and first HD session on 01/31/23 due to uremic symptoms.  Currently on MWF schedule and CLIP for outpatient HD underway.  She is no longer interested in PD and would like to do in-center HD.  Will need AVF/AVG once stabilized per VVS note.  Plan for sometime prior to discharge. 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  Bp well controlled. 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  S/p IV iron x 2 and on ESA. HIV - per primary svc SBO vs ileus -  having bowel movements.  Plan to remove NGT and start liquid diet per surgery. FEN - tube feeds paused   Hypophosphatemia - hold repletion given ESRD and will follow with advancing diet per surgery. Hyponatremia - UF with HD and follow response.   Irena Cords, MD [facility]

## 2023-02-16 NOTE — Progress Notes (Signed)
New Dialysis Start    Patient identified as new dialysis start. Kidney Education packet assembled and given. Discussed the following items with patient:     Current medications and possible changes once started:  Discussed that patient's medications may change over time.  Ex; hypertension medications and diabetes medication.  Nephrologists will adjust as needed.   Fluid restrictions reviewed:  32 oz daily goal:  All liquids count; soups, ice, jello, fruits.   Phosphorus and potassium: Handout given showing high potassium and phosphorus foods.  Alternative food and drink options given.   Family support:  Mother at bedside.   Outpatient Clinic Resources:  Discussed roles of Outpatient clinic staff and advised to make a list of needs, if any, to talk with outpatient staff if needed.   Care plan schedule: Informed patient of Care Plans in outpatient setting and to participate in the care plan.  An invitation would be given from outpatient clinic.    Dialysis Access Options:  Reviewed access options with patients. Discussed in detail about care at home with new AVG & AVF. Reviewed checking bruit and thrill. If dialysis catheter present, educated that patient could not take showers.  Catheter dressing changes were to be done by outpatient clinic staff only.   Home therapy options:  Educated patient about home therapy options:  PD vs home hemo.  Patient stated that she is no longer interested in PD, just wants in center HD now.   Patient up and walking in room, coretrak has been removed. Patient said that she was able to eat some solid foods and was happy about that. Patient was in good spirits and grateful to be doing better after all that she has been through. Patient verbalized understanding of all information covered. Will continue to round on patient during admission.    Jean Rosenthal Dialysis Nurse Coordinator [phone]

## 2023-02-16 NOTE — Progress Notes (Signed)
Brief Nutrition Follow-up:  RD followed up with patient today re: nutrition poc. Pt looks like she feels better on visit today. Pt is tolerating po but not eating very much.   RD arrived to room while Nutrition Ambassador present and taking pt orders for dinner and breakfast. Pt trying to figure out different things to try and eat but diet limited by SOFT diet restriction. Per discussed with MD,  plan to liberalized to Regular and allow pt to eat whatever she would like. Pt ordered plain hamburger with fries and fresh fruit for dinner, oatmeal with brown sugar, raisins and cinnamon with fresh fruit for breakfast.    Pt was honest and stated she has not eaten a lot but is trying. Pt did eat half a Magic Cup today and reports she likes these and will attempt to eat more.   Pt also will not try Ensure Milky Supplement. Pt worries she will not tolerate, does not want to have any more gas (has been dealing with gas pains this entire admission). Pt was willing to try Boost Breeze, RD brought to pt and pt tried while RD present. Pt states she likes the taste and is willing to try and drink these.   Pt reports now that nausea has been a more chronic situation, started when she had chemo several years ago. Pt reports eve prior to admission, she could not tolerate several foods due to nausea. Pt does report nausea has been worse while in hospital. Pt is not vomiting, despite nausea.   Pt is having BMs and is evening walking to bathroom by herself.   Pt expresses that her Sister went to tour [facility] today and pt states that after speaking with her Sister, they do not want her to go to [facility]. Pt indicates that her plan is to discharge home.   Cortrak tube remains in place; TF infusing at 40 ml/hr and pt has been tolerating. Pt is being treating for thrush and has tolerated Cortrak tube has been in place for 4/17 and pt has tolerated Cortrak tube well consider pain associated with thrush; pt even had NG for  decompression and Cortrak in place simultaneously and handle this very well.  Of note, originally plan was possible d/c to [facility] with Cortrak in place. However, pt can not discharge home with Cortrak.   Interventions:   Cortrak discontinued after discussion with patient, TOC and MD. Pt is aware that now that Cortrak is out and TF no longer infusing, she must eat and make nutrition a priority.   Continuing Calorie Count to better assess nutritional intake post Cortrak removal. Pt is aware that her ability to improve po intake may dictate discharge plan  Change oral supplement to Boost Breeze po TID, each supplement provides 250 kcal and 9 grams of protein. Pt tried [employer] while RD present and she reports she likes and will drink  Magic cup BID with meals, each supplement provides 290 kcal and 9 grams of protein  Upgrade diet to REGULAR to allow more options.   Romelle Starcher MS, RDN, LDN, CNSC Registered Dietitian 3 Clinical Nutrition RD Pager and On-Call Pager Number Located in [place]

## 2023-02-16 NOTE — Progress Notes (Signed)
Pt is transferred back from HD to the floor 4E16. She is alert and fully oriented x 4. Hemodynamically stable, afebrile, no acute distress at arrival. We will continue to monitor.  Filiberto Pinks, RN

## 2023-02-16 NOTE — Progress Notes (Signed)
Progress Note   Patient: [name] ZOX:[id number] DOB: May 13, 1966 DOA: 01/29/2023     18 DOS: the patient was seen and examined on 02/16/2023   Brief hospital course: 57 yo female with the past medical history of sarcoidosis, CKD stage 4, and T2DM who presented with back pain, nausea and vomiting. Pain present for the last 2 days prior to hospitalization. On her initial physical examination her systolic blood pressure was >200 and she was started on esmolol drip, blood pressure 169/90, HR 63, RR 13 and 02 saturation 100%, lungs with no wheezing or rales, heart with S1 and S2 present and rhythmic, abdomen with no distention,. No lower extremity edema.    CT renal with findings suggestive of interval development of dissection the infrarenal abdominal aorta likely extending to the left common iliac artery. 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    CT angiography:  Type B acute aortic dissection arising in the proximal descending thoracic aorta distal to the origin of the left subclavian artery. There is a windsock sign in the mid descending thoracic aorta compatible with intimointimal intussusception. The dissection flap extends into the abdominal aorta, right common iliac artery, right external iliac artery, and left common femoral artery terminating at the bifurcation into the superficial and profunda femoral arteries.   The left internal iliac artery is not opacified likely occluded.    The celiac axis, left renal artery, and IMA arise from the true lumen. The right renal artery and probably the SMA arise from the false lumen.   Admitted to hospital 4/14, on cardene and esmolol 4/15 TDC placement, uremic encephalopathy 4/18 CT angio c/a/b, AO bifem. TEVAR  4/19 AKI on CKD requiring RRT  4/20 onward developed ileus vs SBO and intermittent pressor need 4/26 tolerating tube feeds and clear liquids   04/28 transferred to  [facility].  04/29 discontinue TPN, advanced po diet, continue with tube feedings for now.      Assessment and Plan:  Aortic dissection (HCC) Type B dissection s/p TEVAR (4/18) proximal descending thoracic aorta distal to origin of L subclav to iliacs, extending LLE to bifurcation of superficial and profunda fem arteries  Hx HTN Systolic blood pressure remains stable Continue carvedilol to 12.5 mg twice daily with goal systolic blood pressure < 120s.    Post operative ileus, small bowel obstruction,  -clinically improving, although pt still not doing well tolerating PO -Diet is being advanced by gen surg -Patient however with poor oral intake. Pt had been continued on NG tube feedings -Discussed with Vascular and Dietitian. Plan trial of removing NG and encouraging PO intake -Will need outpatient follow-up with vascular surgery.   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 Hypokalemia/ pseudohyponatremia/hypophosphatemia.  She has temporary dialysis catheter placed on and started on HD in 04/16.  Patient currently on Monday Wednesday Friday schedule and cleared process began for outpatient HD. -It is noted per nephrology patient no longer interested in PD and would like to do in center HD. -Per vascular surgery, possible AVF prior to d/c once acute issues resolved Urinary retention, foley cathter in place.    Anemia of chronic renal disease. Post operative anemia,  sp PRBC transfusion 04.26 Iron deficiency anemia sp IV iron. Hgb remains stable -No signs of active bleeding.   Continue with EPO.    Encephalopathy acute -Likely secondary to uremia.   -Significant  clinical improvement/clinical resolution.     Swallow dysfunction,  -she has cortrack in placed for tube feedings.  Dysphagia is improving.  Continue nutritional support.    Depression, continue with sertraline.    Hypertension -Increase carvedilol to 12.5 mg  twice daily for better blood pressure control.     Malnutrition of moderate degree (HCC) Continue nutritional support.  -TPN discontinued. -Diet advanced to ad lib -Had been NG/tube feed dependent, removed NG 5/2. Encourage PO  -See note by dietitian    HIV (human immunodeficiency virus infection) (HCC) -Continue with antiretroviral therapy, -Noted to be positive for thrush and started on Diflucan which we will continue for now.  -Continue nystatin swish and swallow.   Type 2 diabetes mellitus with hyperlipidemia (HCC) -Hemoglobin A1c 6.0  (01/29/2023 ). -Continue SSI.   -Continue statin.    Hypothyroidism -Levothyroxine.    Ileus (HCC) -SBO versus ileus -Patient with clinical improvement, nausea improved, having bowel movements, passing flatus. -Ileus resolved  and General surgery has signed off as of 02/14/2023. -Liberalize diet. Trial of d/c NG and tube feeding      Subjective: Still not tolerating diet, reports food not tasting that well to her  Physical Exam: Vitals:   02/16/23 0722 02/16/23 0845 02/16/23 1152 02/16/23 1642  BP: (!) 157/72  (!) 164/71 (!) 171/76  Pulse: 68 69 67 66  Resp: 17  (!) 26 17  Temp: 98.1 F (36.7 C)  98.9 F (37.2 C) 98.3 F (36.8 C)  TempSrc: Oral  Oral Oral  SpO2: 93%  95% 93%  Weight:      Height:       General exam: Conversant, in no acute distress Respiratory system: normal chest rise, clear, no audible wheezing Cardiovascular system: regular rhythm, s1-s2 Gastrointestinal system: Nondistended, nontender, pos BS Central nervous system: No seizures, no tremors Extremities: No cyanosis, no joint deformities Skin: No rashes, no pallor Psychiatry: Affect normal // no auditory hallucinations   Data Reviewed:  Labs reviewed: Na 133, K 4.1, Cr 4.39, Hgb 8.8  Family Communication: Pt in room, family at bedside  Disposition: Status is: Inpatient Remains inpatient appropriate because: Severity of illness  Planned Discharge  Destination: Home    Author: Rickey Barbara, MD 02/16/2023 6:08 PM  For on call review [email or web address].

## 2023-02-16 NOTE — Progress Notes (Signed)
Occupational Therapy Treatment Patient Details Name: [name] MRN: [id number] DOB: 11-20-1965 Today's Date: 02/16/2023   History of present illness 57 y.o. female admitted 4/14 with c/o back and abdominal pain with N&V. 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 4/18 thoracic endovascular aortic repair. 4/23 SBO. PMH: T2DM, CKD, HIV, sarcoidosis   OT comments  Pt making good progress with functional goals. Pt in bed upon arrival and agreeable for OOB activity. Pt min guard A sit - stand. Pt encouraged to eat lunch seated EOB. Pt able to eat one bite of turkey/gravy and stated that the smell of the food was causing nausea. Pt was able to eat 100% of applesauce and 3 spoonfuls of magic cup ice cream. OT will continue to follow acutely    Recommendations for follow up therapy are one component of a multi-disciplinary discharge planning process, led by the attending physician.  Recommendations may be updated based on patient status, additional functional criteria and insurance authorization.    Assistance Recommended at Discharge    Patient can return home with the following  A little help with walking and/or transfers;A lot of help with bathing/dressing/bathroom;Assistance with cooking/housework;Direct supervision/assist for medications management;Direct supervision/assist for financial management;Assist for transportation;Help with stairs or ramp for entrance   Equipment Recommendations  Tub/shower bench    Recommendations for Other Services      Precautions / Restrictions Precautions Precautions: Fall;Other (comment) Precaution Comments: cortrak Restrictions Weight Bearing Restrictions: No       Mobility Bed Mobility Overal bed mobility: Needs Assistance Bed Mobility: Rolling, Sidelying to Sit Rolling: Modified independent (Device/Increase time) Sidelying to sit: Supervision Supine to sit: Supervision   Sit  to sidelying: Min assist General bed mobility comments: assist with LEs back onto bed    Transfers Overall transfer level: Needs assistance Equipment used: Rolling walker (2 wheels) Transfers: Sit to/from Stand Sit to Stand: Supervision     Step pivot transfers: Supervision           Balance Overall balance assessment: Needs assistance Sitting-balance support: No upper extremity supported, Feet supported Sitting balance-Leahy Scale: Good Sitting balance - Comments: EOB without support     Standing balance-Leahy Scale: Fair                             ADL either performed or assessed with clinical judgement   ADL Overall ADL's : Needs assistance/impaired Eating/Feeding: Set up;Supervision/ safety;Sitting   Grooming: Wash/dry hands;Wash/dry face;Set up;Supervision/safety;Sitting           Upper Body Dressing : Min guard;Sitting Upper Body Dressing Details (indicate cue type and reason): donned clean gown     Toilet Transfer: Min guard;Ambulation;Regular Toilet;Rolling walker (2 wheels)   Toileting- Clothing Manipulation and Hygiene: Min guard;Sit to/from stand       Functional mobility during ADLs: Min guard;Rolling walker (2 wheels) General ADL Comments: Pt encouraged to sit EOB to eat lunch tray    Extremity/Trunk Assessment Upper Extremity Assessment Upper Extremity Assessment: Overall WFL for tasks assessed   Lower Extremity Assessment Lower Extremity Assessment: Defer to PT evaluation   Cervical / Trunk Assessment Cervical / Trunk Assessment: Normal    Vision Ability to See in Adequate Light: 0 Adequate Patient Visual Report: No change from baseline     Perception     Praxis      Cognition Arousal/Alertness: Awake/alert Behavior During  Therapy: WFL for tasks assessed/performed Overall Cognitive Status: Within Functional Limits for tasks assessed                                          Exercises       Shoulder Instructions       General Comments      Pertinent Vitals/ Pain       Pain Assessment Pain Assessment: Faces Faces Pain Scale: Hurts a little bit Pain Location: abdomen, pt states that it's gas pain Pain Descriptors / Indicators: Pressure Pain Intervention(s): Monitored during session, Repositioned  Home Living                                          Prior Functioning/Environment              Frequency  Min 1X/week        Progress Toward Goals  OT Goals(current goals can now be found in the care plan section)  Progress towards OT goals: Progressing toward goals     Plan Discharge plan remains appropriate;Frequency remains appropriate    Co-evaluation                 AM-PAC OT "6 Clicks" Daily Activity     Outcome Measure   Help from another person eating meals?: None Help from another person taking care of personal grooming?: A Little Help from another person toileting, which includes using toliet, bedpan, or urinal?: A Little Help from another person bathing (including washing, rinsing, drying)?: A Lot Help from another person to put on and taking off regular upper body clothing?: A Lot Help from another person to put on and taking off regular lower body clothing?: A Little 6 Click Score: 17    End of Session    OT Visit Diagnosis: Unsteadiness on feet (R26.81);Other abnormalities of gait and mobility (R26.89);Muscle weakness (generalized) (M62.81)   Activity Tolerance Patient tolerated treatment well   Patient Left in bed;with call bell/phone within reach;with family/visitor present   Nurse Communication          Time: 1610-9604 OT Time Calculation (min): 19 min  Charges: OT General Charges $OT Visit: 1 Visit OT Treatments $Self Care/Home Management : 8-22 mins    Galen Manila 02/16/2023, 2:37 PM

## 2023-02-17 DIAGNOSIS — I7102 Dissection of abdominal aorta: Secondary | ICD-10-CM | POA: Diagnosis not present

## 2023-02-17 DIAGNOSIS — N179 Acute kidney failure, unspecified: Secondary | ICD-10-CM | POA: Diagnosis not present

## 2023-02-17 LAB — COMPREHENSIVE METABOLIC PANEL
ALT: 24 U/L (ref 0–44)
AST: 24 U/L (ref 15–41)
Albumin: 2.2 g/dL — ABNORMAL LOW (ref 3.5–5.0)
Alkaline Phosphatase: 141 U/L — ABNORMAL HIGH (ref 38–126)
Anion gap: 11 (ref 5–15)
BUN: 61 mg/dL — ABNORMAL HIGH (ref 6–20)
CO2: 27 mmol/L (ref 22–32)
Calcium: 8.1 mg/dL — ABNORMAL LOW (ref 8.9–10.3)
Chloride: 96 mmol/L — ABNORMAL LOW (ref 98–111)
Creatinine, Ser: 6.3 mg/dL — ABNORMAL HIGH (ref 0.44–1.00)
GFR, Estimated: 7 mL/min — ABNORMAL LOW (ref >60)
Glucose, Bld: 91 mg/dL (ref 70–99)
Potassium: 4.5 mmol/L (ref 3.5–5.1)
Sodium: 134 mmol/L — ABNORMAL LOW (ref 135–145)
Total Bilirubin: 0.5 mg/dL (ref 0.3–1.2)
Total Protein: 6.6 g/dL (ref 6.5–8.1)

## 2023-02-17 LAB — GLUCOSE, CAPILLARY
Glucose-Capillary: 95 mg/dL (ref 70–99)
Glucose-Capillary: 98 mg/dL (ref 70–99)
Glucose-Capillary: 176 mg/dL — ABNORMAL HIGH (ref 70–99)
Glucose-Capillary: 119 mg/dL — ABNORMAL HIGH (ref 70–99)

## 2023-02-17 LAB — CBC
HCT: 25.1 % — ABNORMAL LOW (ref 36.0–46.0)
Hemoglobin: 8.4 g/dL — ABNORMAL LOW (ref 12.0–15.0)
MCH: 31.6 pg (ref 26.0–34.0)
MCHC: 33.5 g/dL (ref 30.0–36.0)
MCV: 94.4 fL (ref 80.0–100.0)
Platelets: 231 10*3/uL (ref 150–400)
RBC: 2.66 MIL/uL — ABNORMAL LOW (ref 3.87–5.11)
RDW: 16.8 % — ABNORMAL HIGH (ref 11.5–15.5)
WBC: 11.9 10*3/uL — ABNORMAL HIGH (ref 4.0–10.5)
nRBC: 0 % (ref 0.0–0.2)

## 2023-02-17 MED ORDER — CARVEDILOL 25 MG PO TABS
25.0000 mg | ORAL_TABLET | Freq: Two times a day (BID) | ORAL | Status: DC
  Administered 2023-02-17 – 2023-02-21 (×6): 25 mg via ORAL
  Filled 2023-02-17 (×6): qty 1

## 2023-02-17 MED ORDER — HYDRALAZINE HCL 50 MG PO TABS
50.0000 mg | ORAL_TABLET | Freq: Three times a day (TID) | ORAL | Status: DC
  Administered 2023-02-17 – 2023-02-18 (×2): 50 mg via ORAL
  Filled 2023-02-17 (×2): qty 1

## 2023-02-17 MED ORDER — HEPARIN SODIUM (PORCINE) 1000 UNIT/ML IJ SOLN
INTRAMUSCULAR | Status: AC
  Administered 2023-02-17: 3200 [IU]
  Filled 2023-02-17: qty 4

## 2023-02-17 MED ORDER — HEPARIN SODIUM (PORCINE) 1000 UNIT/ML DIALYSIS
20.0000 [IU]/kg | INTRAMUSCULAR | Status: DC | PRN
  Filled 2023-02-17: qty 2

## 2023-02-17 MED ORDER — HEPARIN SODIUM (PORCINE) 1000 UNIT/ML IJ SOLN
INTRAMUSCULAR | Status: AC
  Administered 2023-02-17: 2000 [IU]
  Filled 2023-02-17: qty 4

## 2023-02-17 NOTE — Progress Notes (Signed)
Received patient in bed.Awake,alert and oriented x 4.   Access used :LEFT hd catheter that worked well.Dressing on date.  Medicine given:Heparin 2,000 units bolus.  Duration of treatment: 3.5 hours.  Fluid removed : Achieved fluid goal of 2 liters.  Hemo issue: None   Hand off to the patient's nurse.

## 2023-02-17 NOTE — Progress Notes (Signed)
PT Cancellation Note  Patient Details Name: [name] MRN: [id number] DOB: November 16, 1965   Cancelled Treatment:    Reason Eval/Treat Not Completed: (P) Fatigue/lethargy limiting ability to participate (Pt had HD in AM. When reattempting in afternoon, pt c/o fatigue after HD and not agreeable to gait/stair training or OOB to chair transfers despite heavy encouragement. Pt reports her bed wasnt replaced since hydraulics reported broken earlier in week.) Unit secretary and RN notified of pt c/o bed still broken. Unable to further assess her bed as pt defers OOB mobility. Will continue efforts per PT plan of care as schedule permits.   Romona Murdy M Devaeh Amadi 02/17/2023, 3:01 PM

## 2023-02-17 NOTE — Progress Notes (Signed)
Progress Note   Patient: [name] UJW:[id number] DOB: 27-Feb-1966 DOA: 01/29/2023     19 DOS: the patient was seen and examined on 02/17/2023   Brief hospital course: 57 yo female with the past medical history of sarcoidosis, CKD stage 4, and T2DM who presented with back pain, nausea and vomiting. Pain present for the last 2 days prior to hospitalization. On her initial physical examination her systolic blood pressure was >200 and she was started on esmolol drip, blood pressure 169/90, HR 63, RR 13 and 02 saturation 100%, lungs with no wheezing or rales, heart with S1 and S2 present and rhythmic, abdomen with no distention,. No lower extremity edema.    CT renal with findings suggestive of interval development of dissection the infrarenal abdominal aorta likely extending to the left common iliac artery. 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    CT angiography:  Type B acute aortic dissection arising in the proximal descending thoracic aorta distal to the origin of the left subclavian artery. There is a windsock sign in the mid descending thoracic aorta compatible with intimointimal intussusception. The dissection flap extends into the abdominal aorta, right common iliac artery, right external iliac artery, and left common femoral artery terminating at the bifurcation into the superficial and profunda femoral arteries.   The left internal iliac artery is not opacified likely occluded.    The celiac axis, left renal artery, and IMA arise from the true lumen. The right renal artery and probably the SMA arise from the false lumen.   Admitted to hospital 4/14, on cardene and esmolol 4/15 TDC placement, uremic encephalopathy 4/18 CT angio c/a/b, AO bifem. TEVAR  4/19 AKI on CKD requiring RRT  4/20 onward developed ileus vs SBO and intermittent pressor need 4/26 tolerating tube feeds and clear liquids   04/28 transferred to  [facility].  04/29 discontinue TPN, advanced po diet, continue with tube feedings for now.      Assessment and Plan:  Aortic dissection (HCC) Type B dissection s/p TEVAR (4/18) proximal descending thoracic aorta distal to origin of L subclav to iliacs, extending LLE to bifurcation of superficial and profunda fem arteries  Hx HTN Systolic blood pressure remains stable Continue carvedilol to 12.5 mg twice daily with goal systolic blood pressure < 120s.    Post operative ileus, small bowel obstruction,  -clinically improving, although pt still not doing well tolerating PO -Diet is being advanced by gen surg -Patient however with poor oral intake. Pt had been continued on NG tube feedings -Discussed with Vascular and Dietitian. NG tube feeding removed 5/2 -dietitan following, reports po intake seems to be improved today -Will need outpatient follow-up with vascular surgery.   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 Hypokalemia/ pseudohyponatremia/hypophosphatemia.  She has temporary dialysis catheter placed on and started on HD in 04/16.  Patient currently on Monday Wednesday Friday schedule and cleared process began for outpatient HD. -It is noted per nephrology patient no longer interested in PD and would like to do in center HD. -Per vascular surgery, possible AVF prior to d/c once acute issues resolved Urinary retention, foley cathter in place.    Anemia of chronic renal disease. Post operative anemia,  sp PRBC transfusion 04.26 Iron deficiency anemia sp IV iron. Hgb trends remain stable -No signs of active bleeding.   Continue with EPO.    Encephalopathy acute -  Likely secondary to uremia.   -Significant clinical improvement/clinical resolution.     Swallow dysfunction,  Now on liberalized diet Continue nutritional support.    Depression, continue with sertraline.    Hypertension -Increase carvedilol to 12.5 mg twice  daily for better blood pressure control.     Malnutrition of moderate degree (HCC) Continue nutritional support.  -TPN discontinued. -Diet advanced to ad lib -Had been NG/tube feed dependent, removed NG 5/2. Encourage liberalized PO  -See note by dietitian    HIV (human immunodeficiency virus infection) (HCC) -Continue with antiretroviral therapy, -Noted to be positive for thrush and started on Diflucan which we will continue for now.  -Continue nystatin swish and swallow.   Type 2 diabetes mellitus with hyperlipidemia (HCC) -Hemoglobin A1c 6.0  (01/29/2023 ). -Continue SSI.   -Continue statin.    Hypothyroidism -Levothyroxine.    Ileus (HCC) -SBO versus ileus -Patient with clinical improvement, nausea improved, having bowel movements, passing flatus. -Ileus resolved  and General surgery has signed off as of 02/14/2023. -Liberalize diet. NG removed      Subjective: Pt reports continued difficulty tolerating po diet  Physical Exam: Vitals:   02/17/23 1124 02/17/23 1125 02/17/23 1304 02/17/23 1614  BP:  (!) 158/114 (!) 181/85 (!) 156/67  Pulse:  73  67  Resp:  (!) 25  (!) 23  Temp:    98.4 F (36.9 C)  TempSrc:    Oral  SpO2:  97%  96%  Weight: 98 kg     Height:       General exam: Awake, laying in bed, in nad Respiratory system: Normal respiratory effort, no wheezing Cardiovascular system: regular rate, s1, s2 Gastrointestinal system: Soft, nondistended, positive BS Central nervous system: CN2-12 grossly intact, strength intact Extremities: Perfused, no clubbing Skin: Normal skin turgor, no notable skin lesions seen Psychiatry: Mood normal // no visual hallucinations   Data Reviewed:  Labs reviewed: Na 134, K 4.5, Cr 6.3, WBC 11.9, Hgb 8.4  Family Communication: Pt in room, family not at bedside  Disposition: Status is: Inpatient Remains inpatient appropriate because: Severity of illness  Planned Discharge Destination: Home    Author: Rickey Barbara,  MD 02/17/2023 5:17 PM  For on call review [email or web address].

## 2023-02-17 NOTE — Progress Notes (Signed)
Calorie Count Note  Diet: Regular Supplements: Boost Breeze, Magic Cup  Cortrak removed yesterday. Pt reports she feels better with Cortrak out as she feels like she "can breathe easier and actually blow her nose."  iHD this AM with 2L UF  Pt missed breakfast this AM due to iHD. Pt looking at menu to order meal tray when RD walked in. RD assisted pt with ordering (pt wanted breakfast even thought it was lunch time and ordered oatmeal, cheerios with Lactaid milk and fruit cup.   Pt indicating she is drinking some of Boost Breeze, unclear how much. Pt eating part of some Magic Cups but not 100%  5/02 Breakfast: 50% of frosted flakes with milk, 100% grape juice Lunch: 50% Magic Cup, 100% applesauce, bites of [place] Cortrak removed before [employer]: 50% of Hamburger, 50% of french fries, 50% fresh fruit cup  5/03 Breakfast: missed due to iHD  Intake at dinner last night post Cortrak removal is an improvement. PO intake today pending.   Intervention:  1) Continue Regular Diet, encouraged small frequent meals. PO intake is improving. Encourage family to bring in outside food if pt desires. Continue calorie count 2) Boost Breeze po TID, each supplement provides 250 kcal and 9 grams of protein 3) Magic cup BID with meals, each supplement provides 290 kcal and 9 grams of protein 4) Renal MVI daily   Romelle Starcher MS, RDN, LDN, CNSC Registered Dietitian 3 Clinical Nutrition RD Pager and On-Call Pager Number Located in [place]

## 2023-02-17 NOTE — Procedures (Signed)
I was present at this dialysis session. I have reviewed the session itself and made appropriate changes.   Vital signs in last 24 hours:  Temp:  [98.1 F (36.7 C)-99 F (37.2 C)] 99 F (37.2 C) (05/03 0735) Pulse Rate:  [64-68] 64 (05/03 0830) Resp:  [17-26] 22 (05/03 0830) BP: (149-171)/(61-76) 157/69 (05/03 0830) SpO2:  [93 %-98 %] 98 % (05/03 0830) Weight:  [99 kg-103 kg] 99 kg (05/03 0735) Weight change: 5.3 kg Filed Weights   02/16/23 0117 02/17/23 0500 02/17/23 0735  Weight: 97.7 kg 103 kg 99 kg    Recent Labs  Lab 02/15/23 1515 02/16/23 0345 02/17/23 0515  NA 129*   < > 134*  K 4.2   < > 4.5  CL 92*   < > 96*  CO2 25   < > 27  GLUCOSE 205*   < > 91  BUN 79*   < > 61*  CREATININE 7.23*   < > 6.30*  CALCIUM 7.8*   < > 8.1*  PHOS 4.0  --   --    < > = values in this interval not displayed.    Recent Labs  Lab 02/14/23 0635 02/15/23 0540 02/15/23 1516 02/16/23 0345 02/17/23 0515  WBC 17.7*   < > 13.8* 14.1* 11.9*  NEUTROABS 13.7*  --   --   --   --   HGB 9.6*   < > 8.9* 8.8* 8.4*  HCT 27.7*   < > 26.2* 25.4* 25.1*  MCV 91.7   < > 93.2 92.0 94.4  PLT 235   < > 229 209 231   < > = values in this interval not displayed.    Scheduled Meds:  aspirin  81 mg Per Tube Daily   bisacodyl  10 mg Rectal Q0600   carvedilol  12.5 mg Oral BID WC   Chlorhexidine Gluconate Cloth  6 each Topical Daily   darbepoetin (ARANESP) injection - NON-DIALYSIS  60 mcg Subcutaneous Q Wed-1800   dolutegravir  50 mg Per Tube Daily   emtricitabine-tenofovir AF  1 tablet Per Tube Daily   feeding supplement  1 Container Oral TID BM   fluconazole  100 mg Per Tube Daily   heparin injection (subcutaneous)  5,000 Units Subcutaneous Q12H   hydrALAZINE  25 mg Oral Q8H   insulin aspart  0-15 Units Subcutaneous TID WC   insulin aspart  0-5 Units Subcutaneous QHS   levothyroxine  175 mcg Per Tube Q0600   lidocaine  1 patch Transdermal Q24H   multivitamin  1 tablet Per Tube QHS   nystatin   5 mL Oral QID   omeprazole  40 mg Per Tube BID AC   mouth rinse  15 mL Mouth Rinse 4 times per day   rosuvastatin  10 mg Per Tube Daily   sertraline  50 mg Per Tube Daily   Continuous Infusions:  sodium chloride Stopped (02/10/23 1335)   magnesium sulfate bolus IVPB     PRN Meds:.acetaminophen, docusate, docusate sodium, guaiFENesin-dextromethorphan, hydrALAZINE, magnesium sulfate bolus IVPB, metoCLOPramide (REGLAN) injection, ondansetron (ZOFRAN) IV, mouth rinse, phenol, simethicone, traMADol   Irena Cords,  MD 02/17/2023, 8:49 AM

## 2023-02-18 DIAGNOSIS — I7102 Dissection of abdominal aorta: Secondary | ICD-10-CM | POA: Diagnosis not present

## 2023-02-18 DIAGNOSIS — N179 Acute kidney failure, unspecified: Secondary | ICD-10-CM | POA: Diagnosis not present

## 2023-02-18 LAB — CBC
HCT: 25.3 % — ABNORMAL LOW (ref 36.0–46.0)
Hemoglobin: 8.5 g/dL — ABNORMAL LOW (ref 12.0–15.0)
MCH: 32.1 pg (ref 26.0–34.0)
MCHC: 33.6 g/dL (ref 30.0–36.0)
MCV: 95.5 fL (ref 80.0–100.0)
Platelets: 239 10*3/uL (ref 150–400)
RBC: 2.65 MIL/uL — ABNORMAL LOW (ref 3.87–5.11)
RDW: 16.9 % — ABNORMAL HIGH (ref 11.5–15.5)
WBC: 10.5 10*3/uL (ref 4.0–10.5)
nRBC: 0 % (ref 0.0–0.2)

## 2023-02-18 LAB — COMPREHENSIVE METABOLIC PANEL
ALT: 23 U/L (ref 0–44)
AST: 24 U/L (ref 15–41)
Albumin: 2.1 g/dL — ABNORMAL LOW (ref 3.5–5.0)
Alkaline Phosphatase: 146 U/L — ABNORMAL HIGH (ref 38–126)
Anion gap: 9 (ref 5–15)
BUN: 35 mg/dL — ABNORMAL HIGH (ref 6–20)
CO2: 29 mmol/L (ref 22–32)
Calcium: 8.3 mg/dL — ABNORMAL LOW (ref 8.9–10.3)
Chloride: 96 mmol/L — ABNORMAL LOW (ref 98–111)
Creatinine, Ser: 4.82 mg/dL — ABNORMAL HIGH (ref 0.44–1.00)
GFR, Estimated: 10 mL/min — ABNORMAL LOW (ref >60)
Glucose, Bld: 102 mg/dL — ABNORMAL HIGH (ref 70–99)
Potassium: 4.4 mmol/L (ref 3.5–5.1)
Sodium: 134 mmol/L — ABNORMAL LOW (ref 135–145)
Total Bilirubin: 0.7 mg/dL (ref 0.3–1.2)
Total Protein: 6.9 g/dL (ref 6.5–8.1)

## 2023-02-18 LAB — GLUCOSE, CAPILLARY
Glucose-Capillary: 85 mg/dL (ref 70–99)
Glucose-Capillary: 157 mg/dL — ABNORMAL HIGH (ref 70–99)
Glucose-Capillary: 127 mg/dL — ABNORMAL HIGH (ref 70–99)
Glucose-Capillary: 169 mg/dL — ABNORMAL HIGH (ref 70–99)

## 2023-02-18 MED ORDER — DOCUSATE SODIUM 100 MG PO CAPS
100.0000 mg | ORAL_CAPSULE | Freq: Every day | ORAL | Status: DC
  Administered 2023-02-18 – 2023-02-21 (×4): 100 mg via ORAL
  Filled 2023-02-18 (×4): qty 1

## 2023-02-18 MED ORDER — BISACODYL 10 MG RE SUPP: 10.0000 mg | Freq: Every day | RECTAL | Status: DC | PRN

## 2023-02-18 MED ORDER — DOLUTEGRAVIR SODIUM 50 MG PO TABS
50.0000 mg | ORAL_TABLET | Freq: Every day | ORAL | Status: DC
  Administered 2023-02-18 – 2023-02-21 (×4): 50 mg via ORAL
  Filled 2023-02-18 (×3): qty 1

## 2023-02-18 MED ORDER — RENA-VITE PO TABS
1.0000 | ORAL_TABLET | Freq: Every day | ORAL | Status: DC
  Administered 2023-02-18 – 2023-02-20 (×3): 1 via ORAL
  Filled 2023-02-18 (×3): qty 1

## 2023-02-18 MED ORDER — LEVOTHYROXINE SODIUM 75 MCG PO TABS
175.0000 ug | ORAL_TABLET | Freq: Every day | ORAL | Status: DC
  Administered 2023-02-19 – 2023-02-21 (×3): 175 ug via ORAL
  Filled 2023-02-18 (×3): qty 1

## 2023-02-18 MED ORDER — SERTRALINE HCL 50 MG PO TABS
50.0000 mg | ORAL_TABLET | Freq: Every day | ORAL | Status: DC
  Administered 2023-02-18 – 2023-02-21 (×4): 50 mg via ORAL
  Filled 2023-02-18 (×3): qty 1

## 2023-02-18 MED ORDER — ROSUVASTATIN CALCIUM 5 MG PO TABS
10.0000 mg | ORAL_TABLET | Freq: Every day | ORAL | Status: DC
  Administered 2023-02-18 – 2023-02-21 (×4): 10 mg via ORAL
  Filled 2023-02-18 (×3): qty 2

## 2023-02-18 MED ORDER — ASPIRIN 81 MG PO CHEW
81.0000 mg | CHEWABLE_TABLET | Freq: Every day | ORAL | Status: DC
  Administered 2023-02-18 – 2023-02-21 (×4): 81 mg via ORAL
  Filled 2023-02-18 (×3): qty 1

## 2023-02-18 MED ORDER — DOCUSATE SODIUM 100 MG PO CAPS: 100.0000 mg | ORAL_CAPSULE | Freq: Every day | ORAL | Status: DC | PRN

## 2023-02-18 MED ORDER — EMTRICITABINE-TENOFOVIR AF 200-25 MG PO TABS
1.0000 | ORAL_TABLET | Freq: Every day | ORAL | Status: DC
  Administered 2023-02-18 – 2023-02-21 (×4): 1 via ORAL
  Filled 2023-02-18 (×3): qty 1

## 2023-02-18 MED ORDER — OMEPRAZOLE 20 MG PO CPDR
40.0000 mg | DELAYED_RELEASE_CAPSULE | Freq: Two times a day (BID) | ORAL | Status: DC
  Administered 2023-02-18 – 2023-02-21 (×5): 40 mg via ORAL
  Filled 2023-02-18 (×8): qty 2

## 2023-02-18 MED ORDER — HYDRALAZINE HCL 50 MG PO TABS
100.0000 mg | ORAL_TABLET | Freq: Three times a day (TID) | ORAL | Status: DC
  Administered 2023-02-18 – 2023-02-21 (×7): 100 mg via ORAL
  Filled 2023-02-18 (×7): qty 2

## 2023-02-18 NOTE — Progress Notes (Signed)
Mobility Specialist Progress Note   02/18/23 1155  Mobility  Activity Ambulated with assistance in hallway  Level of Assistance Contact guard assist, steadying assist  Assistive Device Front wheel walker  Distance Ambulated (ft) 140 ft  Activity Response Tolerated well  Mobility Referral Yes  $Mobility charge 1 Mobility   Pt received in bed fatigued from recent bath w/ NT but agreeable to mobility. Stand by assist for bed mobility and CGA for hallway ambulation. As distance progressed so did pt's fatigue, pt requesting to shorten session today. Returned back to bed with all needs met and call bell in reach.   Frederico Hamman Mobility Specialist Please contact via SecureChat or  Rehab office at [phone]

## 2023-02-18 NOTE — Progress Notes (Signed)
Progress Note   Patient: [name] ZOX:[id number] DOB: 28-Dec-1965 DOA: 01/29/2023     20 DOS: the patient was seen and examined on 02/18/2023   Brief hospital course: 57 yo female with the past medical history of sarcoidosis, CKD stage 4, and T2DM who presented with back pain, nausea and vomiting. Pain present for the last 2 days prior to hospitalization. On her initial physical examination her systolic blood pressure was >200 and she was started on esmolol drip, blood pressure 169/90, HR 63, RR 13 and 02 saturation 100%, lungs with no wheezing or rales, heart with S1 and S2 present and rhythmic, abdomen with no distention,. No lower extremity edema.    CT renal with findings suggestive of interval development of dissection the infrarenal abdominal aorta likely extending to the left common iliac artery. 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    CT angiography:  Type B acute aortic dissection arising in the proximal descending thoracic aorta distal to the origin of the left subclavian artery. There is a windsock sign in the mid descending thoracic aorta compatible with intimointimal intussusception. The dissection flap extends into the abdominal aorta, right common iliac artery, right external iliac artery, and left common femoral artery terminating at the bifurcation into the superficial and profunda femoral arteries.   The left internal iliac artery is not opacified likely occluded.    The celiac axis, left renal artery, and IMA arise from the true lumen. The right renal artery and probably the SMA arise from the false lumen.   Admitted to hospital 4/14, on cardene and esmolol 4/15 TDC placement, uremic encephalopathy 4/18 CT angio c/a/b, AO bifem. TEVAR  4/19 AKI on CKD requiring RRT  4/20 onward developed ileus vs SBO and intermittent pressor need 4/26 tolerating tube feeds and clear liquids   04/28 transferred to  [facility].  04/29 discontinue TPN, advanced po diet, continue with tube feedings for now.      Assessment and Plan:  Aortic dissection (HCC) Type B dissection s/p TEVAR (4/18) proximal descending thoracic aorta distal to origin of L subclav to iliacs, extending LLE to bifurcation of superficial and profunda fem arteries  Hx HTN Continue carvedilol to 12.5 mg twice daily with goal systolic blood pressure < 120s.  Hydralazine dose increased to 100mg  po q8h for better bp control   Post operative ileus, small bowel obstruction,  -clinically improving, although pt still not doing well tolerating PO -Diet is being advanced by gen surg -Patient however with poor oral intake. Pt had been continued on NG tube feedings -Discussed with Vascular and Dietitian. NG tube feeding removed 5/2 -dietitan following, reports po intake seems to be improved today -Will need outpatient follow-up with vascular surgery.   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 Hypokalemia/ pseudohyponatremia/hypophosphatemia.  She has temporary dialysis catheter placed on and started on HD in 04/16.  Patient currently on Monday Wednesday Friday schedule and cleared process began for outpatient HD. -It is noted per nephrology patient no longer interested in PD and would like to do in center HD. -Per vascular surgery, possible AVF prior to d/c once acute issues resolved Urinary retention, foley cathter in place.    Anemia of chronic renal disease. Post operative anemia,  sp PRBC transfusion 04.26 Iron deficiency anemia sp IV iron. Hgb trends remain stable -No signs of active bleeding.   Continue with  EPO.    Encephalopathy acute -Likely secondary to uremia.   -Significant clinical improvement/clinical resolution.     Swallow dysfunction,  Now on liberalized diet Continue nutritional support.    Depression, continue with sertraline.    Hypertension -Increase  carvedilol to 12.5 mg twice daily for better blood pressure control.     Malnutrition of moderate degree (HCC) Continue nutritional support.  -TPN discontinued. -Diet advanced to ad lib -Had been NG/tube feed dependent, removed NG 5/2.  -Pt reports doing better with PO intake   HIV (human immunodeficiency virus infection) (HCC) -Continue with antiretroviral therapy, -Noted to be positive for thrush and started on Diflucan which we will continue for now.  -Continue nystatin swish and swallow.   Type 2 diabetes mellitus with hyperlipidemia (HCC) -Hemoglobin A1c 6.0  (01/29/2023 ). -Continue SSI.   -Continue statin.    Hypothyroidism -Levothyroxine.    Ileus (HCC) -SBO versus ileus -Patient with clinical improvement, nausea improved, having bowel movements, passing flatus. -Ileus resolved  and General surgery has signed off as of 02/14/2023. -NG remains out. Cont with liberalized diet      Subjective: States doing better with PO this AM.  Physical Exam: Vitals:   02/17/23 2322 02/18/23 0404 02/18/23 0805 02/18/23 1120  BP: (!) 153/64 (!) 160/67 (!) 144/65 137/61  Pulse: 62 72 67 66  Resp: 19 18 19 20   Temp: 98.2 F (36.8 C) 98.4 F (36.9 C) 98.6 F (37 C) 98 F (36.7 C)  TempSrc: Oral Oral Oral Oral  SpO2: 96% 100% 100% 96%  Weight:  99.1 kg    Height:       General exam: Conversant, in no acute distress Respiratory system: normal chest rise, clear, no audible wheezing Cardiovascular system: regular rhythm, s1-s2 Gastrointestinal system: Nondistended, nontender, pos BS Central nervous system: No seizures, no tremors Extremities: No cyanosis, no joint deformities Skin: No rashes, no pallor Psychiatry: Affect normal // no auditory hallucinations   Data Reviewed:  Labs reviewed: Na 134, K 4.4, Cr 4.82, Hgb 8.5, Plts 239  Family Communication: Pt in room, family not at bedside  Disposition: Status is: Inpatient Remains inpatient appropriate because: Severity of  illness  Planned Discharge Destination: Home    Author: Rickey Barbara, MD 02/18/2023 3:58 PM  For on call review [email or web address].

## 2023-02-18 NOTE — Progress Notes (Signed)
Patient ID: [name], female   DOB: 22-Nov-1965, 57 y.o.   MRN: [id number] S: No new complaints.  Tolerated HD well but not eating much. O:BP (!) 144/65 (BP Location: Right Arm)   Pulse 67   Temp 98.6 F (37 C) (Oral)   Resp 19   Ht 5\' 5"  (1.651 m)   Wt 99.1 kg   LMP 03/31/2014 (LMP Unknown)   SpO2 100%   BMI 36.36 kg/m   Intake/Output Summary (Last 24 hours) at 02/18/2023 1109 Last data filed at 02/17/2023 2322 Gross per 24 hour  Intake --  Output 2000 ml  Net -2000 ml   Intake/Output: I/O last 3 completed shifts: In: -  Out: 2000 [Other:2000]  Intake/Output this shift:  No intake/output data recorded. Weight change: -4 kg Gen: NAD CVS: RRR Resp: CTA Abd: +BS, soft, NT/ND Ext: no edema  Recent Labs  Lab 02/12/23 0416 02/13/23 0409 02/14/23 0635 02/15/23 0540 02/15/23 1515 02/16/23 0345 02/17/23 0515 02/18/23 0136  NA 129* 128* 131* 132* 129* 133* 134* 134*  K 3.8 4.0 3.7 4.3 4.2 4.1 4.5 4.4  CL 98 98 95* 95* 92* 94* 96* 96*  CO2 21* 20* 26 27 25 29 27 29   GLUCOSE 203* 211* 204* 172* 205* 181* 91 102*  BUN 71* 94* 49* 71* 79* 40* 61* 35*  CREATININE 7.20* 8.44* 5.14* 6.49* 7.23* 4.39* 6.30* 4.82*  ALBUMIN  --  2.2*  --  2.1* 2.3* 2.2* 2.2* 2.1*  CALCIUM 8.1* 8.1* 8.0* 7.9* 7.8* 8.1* 8.1* 8.3*  PHOS 2.2* 2.0* 1.9* 4.2 4.0  --   --   --   AST  --  35  --   --   --  30 24 24   ALT  --  20  --   --   --  27 24 23    Liver Function Tests: Recent Labs  Lab 02/16/23 0345 02/17/23 0515 02/18/23 0136  AST 30 24 24   ALT 27 24 23   ALKPHOS 159* 141* 146*  BILITOT 0.7 0.5 0.7  PROT 6.8 6.6 6.9  ALBUMIN 2.2* 2.2* 2.1*   No results for input(s): "LIPASE", "AMYLASE" in the last 168 hours. No results for input(s): "AMMONIA" in the last 168 hours. CBC: Recent Labs  Lab 02/14/23 0635 02/15/23 0540 02/15/23 1516 02/16/23 0345 02/17/23 0515 02/18/23 0136  WBC 17.7* 13.5* 13.8* 14.1* 11.9* 10.5  NEUTROABS 13.7*  --   --   --   --   --   HGB 9.6* 9.1*  8.9* 8.8* 8.4* 8.5*  HCT 27.7* 26.7* 26.2* 25.4* 25.1* 25.3*  MCV 91.7 94.0 93.2 92.0 94.4 95.5  PLT 235 228 229 209 231 239   Cardiac Enzymes: No results for input(s): "CKTOTAL", "CKMB", "CKMBINDEX", "TROPONINI" in the last 168 hours. CBG: Recent Labs  Lab 02/17/23 0623 02/17/23 1206 02/17/23 1613 02/17/23 2102 02/18/23 0620  GLUCAP 95 98 176* 119* 85    Iron Studies: No results for input(s): "IRON", "TIBC", "TRANSFERRIN", "FERRITIN" in the last 72 hours. Studies/Results: No results found.  aspirin  81 mg Oral Daily   bisacodyl  10 mg Rectal Q0600   carvedilol  25 mg Oral BID WC   Chlorhexidine Gluconate Cloth  6 each Topical Daily   darbepoetin (ARANESP) injection - NON-DIALYSIS  60 mcg Subcutaneous Q Wed-1800   dolutegravir  50 mg Oral Daily   emtricitabine-tenofovir AF  1 tablet Oral Daily   feeding supplement  1 Container Oral TID BM   heparin injection (subcutaneous)  5,000 Units Subcutaneous Q12H   hydrALAZINE  50 mg Oral Q8H   insulin aspart  0-15 Units Subcutaneous TID WC   insulin aspart  0-5 Units Subcutaneous QHS   [START ON 02/19/2023] levothyroxine  175 mcg Oral Q0600   lidocaine  1 patch Transdermal Q24H   multivitamin  1 tablet Oral QHS   nystatin  5 mL Oral QID   omeprazole  40 mg Oral BID AC   mouth rinse  15 mL Mouth Rinse 4 times per day   rosuvastatin  10 mg Oral Daily   sertraline  50 mg Oral Daily    BMET    Component Value Date/Time   NA 134 (L) 02/18/2023 0136   NA 142 12/05/2022 0000   K 4.4 02/18/2023 0136   CL 96 (L) 02/18/2023 0136   CO2 29 02/18/2023 0136   GLUCOSE 102 (H) 02/18/2023 0136   BUN 35 (H) 02/18/2023 0136   BUN 44 (A) 12/05/2022 0000   CREATININE 4.82 (H) 02/18/2023 0136   CREATININE 2.72 (H) 07/27/2021 1110   CREATININE 2.74 (H) 03/17/2021 1050   CALCIUM 8.3 (L) 02/18/2023 0136   GFRNONAA 10 (L) 02/18/2023 0136   GFRNONAA 20 (L) 07/27/2021 1110   GFRAA 39 (L) 07/14/2020 1053   GFRAA 33 (L) 08/12/2019 1447   CBC     Component Value Date/Time   WBC 10.5 02/18/2023 0136   RBC 2.65 (L) 02/18/2023 0136   HGB 8.5 (L) 02/18/2023 0136   HGB 11.7 (L) 07/27/2021 1110   HCT 25.3 (L) 02/18/2023 0136   PLT 239 02/18/2023 0136   PLT 228 07/27/2021 1110   MCV 95.5 02/18/2023 0136   MCH 32.1 02/18/2023 0136   MCHC 33.6 02/18/2023 0136   RDW 16.9 (H) 02/18/2023 0136   LYMPHSABS 0.9 02/14/2023 0635   MONOABS 1.6 (H) 02/14/2023 0635   EOSABS 0.1 02/14/2023 0635   BASOSABS 0.1 02/14/2023 0635    Assessment/Plan:   New ESRD - s/p RIJ TDC on 01/30/23 and first HD session on 01/31/23 due to uremic symptoms.  Currently on MWF schedule and CLIP for outpatient HD underway.  She is no longer interested in PD and would like to do in-center HD.  Will need AVF/AVG once stabilized per VVS note.  Plan for sometime prior to discharge. 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  Bp well controlled. Acute metabolic encephalopathy - improved with HD HTN - still above goal given aortic dissection.  Had increased carvedilol to 25 mg bid and hydralazine to 50 mg tid yesterday but still above goal.  Will increase hydralazine to 100 mg tid and follow bp response.  Goal BP <130/80.  Anemia of ESRD - hgb 5.9, plan for blood transfusion with HD.  S/p IV iron x 2 and on ESA. HIV - per primary svc SBO vs ileus -  having bowel movements.  Plan to remove NGT and start liquid diet per surgery. FEN - tube feeds paused   Hypophosphatemia - hold repletion given ESRD and will follow with advancing diet per surgery. Hyponatremia - UF with HD and follow response.   Irena Cords, MD [facility]

## 2023-02-19 DIAGNOSIS — I71012 Dissection of descending thoracic aorta: Secondary | ICD-10-CM | POA: Diagnosis not present

## 2023-02-19 DIAGNOSIS — I7102 Dissection of abdominal aorta: Secondary | ICD-10-CM | POA: Diagnosis not present

## 2023-02-19 DIAGNOSIS — N179 Acute kidney failure, unspecified: Secondary | ICD-10-CM | POA: Diagnosis not present

## 2023-02-19 LAB — GLUCOSE, CAPILLARY
Glucose-Capillary: 160 mg/dL — ABNORMAL HIGH (ref 70–99)
Glucose-Capillary: 143 mg/dL — ABNORMAL HIGH (ref 70–99)
Glucose-Capillary: 119 mg/dL — ABNORMAL HIGH (ref 70–99)
Glucose-Capillary: 207 mg/dL — ABNORMAL HIGH (ref 70–99)

## 2023-02-19 NOTE — Plan of Care (Addendum)
Plan of care is reviewed. Pt has been progressing.  Problem: Clinical Measurements: HIV positive, and risk of CLABSI from having central line left IJ and HD cath on right subclavian.  Goal: Will remain free from infection Outcome: Progressing: Pt has been afebrile, routinely getting strictly lines care, CHG bath daily for prevention, dressing of HD cath and central line is clean, intact at up to date. On Dolutegravir and Emtricitabine-Tenofovir AF, the antiviral therapy, thrush has improved, Nystatin oral suspension and Diflucan are completed and discontinued. The necessity of frequent oral hygiene with oral mouth rinse and brushing teeth are discussed.     Problem: Clinical Measurements: Aortic dissection Type B dissection s/p TEVAR (4/18), Hx HTN Goal: Cardiovascular complication will be avoided Outcome: Progressing: stable hemodynamically, NSR on the monitor, BP 138/57-168/63 mmHg. Goal SBP <120 mmHg. On Carvedilol 12.5 mg BID, increased Hydralazine to 100 mg q 8 hrs. Will close follow up her BP with med regulation.    Problem: Nutrition: Ileus is resolved , malnutrition of moderate degree with low appetite, and swallowing dysfunction, and DM type II. Goal: Adequate nutrition will be maintained Outcome: Progressing: Pt reported today has better appetite, but refused BOOST/RESOURCE BREEZE feeding supplement. Checking blood glucose ACSH with insulin moderate sliding scale. Her nausea has been improved, last bowel movements on 02/18/23,  positive passing flatus, denied abdominal pain or distention. On PPI (Omeprazole BID), multivitamin (RENA-VIT) DQ.    Problem: Coping: Hx of depression. Goal: Level of anxiety will decrease Outcome: Progressing: alert and more pleasant when family member visited. Able to express her emotion and concern. Continue on Zoloft QD. She has rested well at night.  Filiberto Pinks, RN

## 2023-02-19 NOTE — Progress Notes (Signed)
Progress Note   Patient: [name] ZOX:[id number] DOB: 1965-12-27 DOA: 01/29/2023     21 DOS: the patient was seen and examined on 02/19/2023   Brief hospital course: 57 yo female with the past medical history of sarcoidosis, CKD stage 4, and T2DM who presented with back pain, nausea and vomiting. Pain present for the last 2 days prior to hospitalization. On her initial physical examination her systolic blood pressure was >200 and she was started on esmolol drip, blood pressure 169/90, HR 63, RR 13 and 02 saturation 100%, lungs with no wheezing or rales, heart with S1 and S2 present and rhythmic, abdomen with no distention,. No lower extremity edema.    CT renal with findings suggestive of interval development of dissection the infrarenal abdominal aorta likely extending to the left common iliac artery. 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    CT angiography:  Type B acute aortic dissection arising in the proximal descending thoracic aorta distal to the origin of the left subclavian artery. There is a windsock sign in the mid descending thoracic aorta compatible with intimointimal intussusception. The dissection flap extends into the abdominal aorta, right common iliac artery, right external iliac artery, and left common femoral artery terminating at the bifurcation into the superficial and profunda femoral arteries.   The left internal iliac artery is not opacified likely occluded.    The celiac axis, left renal artery, and IMA arise from the true lumen. The right renal artery and probably the SMA arise from the false lumen.   Admitted to hospital 4/14, on cardene and esmolol 4/15 TDC placement, uremic encephalopathy 4/18 CT angio c/a/b, AO bifem. TEVAR  4/19 AKI on CKD requiring RRT  4/20 onward developed ileus vs SBO and intermittent pressor need 4/26 tolerating tube feeds and clear liquids   04/28 transferred to  [facility].  04/29 discontinue TPN, advanced po diet, continue with tube feedings for now.      Assessment and Plan:  Aortic dissection (HCC) Type B dissection s/p TEVAR (4/18) proximal descending thoracic aorta distal to origin of L subclav to iliacs, extending LLE to bifurcation of superficial and profunda fem arteries  Hx HTN Continue carvedilol to 25 mg twice daily with goal systolic blood pressure < 130s.  Hydralazine dose recently increased to 100mg  po q8h for better bp control   Post operative ileus, small bowel obstruction,  -Recent poor oral intake requiring supplemental NG tube feeding -Dietitian following. NG tube feeding removed 5/2 -continue on liberalized diet for now. Pt reports tolerating better   AKI (acute kidney injury) (HCC) CKD stage 4 to 5, she has been started on HD due to uremic symptoms. Hypokalemia/ pseudohyponatremia/hypophosphatemia.  She has temporary dialysis catheter placed on and started on HD in 04/16.  Patient currently on Monday Wednesday Friday schedule and cleared process began for outpatient HD. -It is noted per nephrology patient no longer interested in PD and would like to do in center HD. -Per vascular surgery, possible AVF once PO intake has improved. Per Vascular, pt has one month f/u with CTA c/a/p and will discuss HD access at that time   Anemia of chronic renal disease. Post operative anemia,  sp PRBC transfusion 04.26 Iron deficiency anemia sp IV iron. Hgb trends remain stable -No signs of active bleeding.   Continued with EPO.    Encephalopathy acute -Likely secondary to uremia.   -Significant clinical improvement/clinical resolution.     Swallow dysfunction,  Now on liberalized diet Continue nutritional support.    Depression, continue with sertraline.    Hypertension -Now on 25mg  bid carvedilol and 100mg  q8h hydralazine -goal bp <130/80.     Malnutrition of moderate degree (HCC) -TPN discontinued. -Diet advanced to ad  lib -Had been NG/tube feed dependent, removed NG 5/2.  -Pt reports doing better with PO intake   HIV (human immunodeficiency virus infection) (HCC) -Continue with antiretroviral therapy, -Noted to be positive for thrush and started on Diflucan which we will continue for now.  -Continue nystatin swish and swallow.   Type 2 diabetes mellitus with hyperlipidemia (HCC) -Hemoglobin A1c 6.0  (01/29/2023 ). -Continue SSI.   -Continue statin.    Hypothyroidism -Levothyroxine.    Ileus (HCC) -SBO versus ileus -Patient with clinical improvement, nausea improved, having bowel movements, passing flatus. -Ileus resolved  and General surgery has signed off as of 02/14/2023. -NG remains out. Cont with liberalized diet      Subjective: Reports doing better with PO intake. States "metallic taste" is improving  Physical Exam: Vitals:   02/19/23 0500 02/19/23 0734 02/19/23 1224 02/19/23 1626  BP:  (!) 151/67 (!) 144/64 (!) 156/74  Pulse:  65 64 62  Resp:  12 19 17   Temp:  98.6 F (37 C) 98.3 F (36.8 C) (!) 97.5 F (36.4 C)  TempSrc:  Oral Oral Oral  SpO2:  92% 92% 97%  Weight: 95.7 kg     Height:       General exam: Conversant, in no acute distress Respiratory system: normal chest rise, clear, no audible wheezing Cardiovascular system: regular rhythm, s1-s2 Gastrointestinal system: Nondistended, nontender, pos BS Central nervous system: No seizures, no tremors Extremities: No cyanosis, no joint deformities Skin: No rashes, no pallor Psychiatry: Affect normal // no auditory hallucinations   Data Reviewed:  There are no new results to review at this time.  Family Communication: Pt in room, family not at bedside  Disposition: Status is: Inpatient Remains inpatient appropriate because: Severity of illness  Planned Discharge Destination: Home    Author: Rickey Barbara, MD 02/19/2023 5:30 PM  For on call review [email or web address].

## 2023-02-19 NOTE — TOC Progression Note (Signed)
Transition of Care [facility]) - Progression Note    Patient Details  Name: [name] MRN: [id number] Date of Birth: Mar 09, 1966  Transition of Care [facility]) CM/SW Contact  Zohal Reny, Johnnette Litter, [place] Phone [phone] 02/19/2023, 2:48 PM  Clinical Narrative:  [facility] team for discharge planning. Received a call from Kara Mead- [facility]: [phone] to discuss patient's appropriateness for admission. She has been approved for the [facility] and auth due to expire today. Kara Mead was inquiring about nutritional status. Current statys as noted in chart was nutrition by mouth ad lib. There is no parenteral or enteral nutrition at this time. Secure chat with Dr. Rhona Leavens reveals patient is no longer interested in [facility]. Relayed this to [place]. LTACH will no longer be pursued at this time. Will continue to monitor for further discharge planning needs.     Expected Discharge Plan: Home w Home Health Services Barriers to Discharge: No Barriers Identified  Expected Discharge Plan and Services   Discharge Planning Services: CM Consult Post Acute Care Choice: Long Term Acute Care (LTAC) Living arrangements for the past 2 months: Single Family Home                                       Social Determinants of Health (SDOH) Interventions SDOH Screenings   Food Insecurity: No Food Insecurity (02/03/2023)  Housing: Low Risk  (02/03/2023)  Transportation Needs: No Transportation Needs (02/03/2023)  Utilities: Not At Risk (02/03/2023)  Depression (PHQ2-9): Low Risk  (06/09/2022)  Tobacco Use: Medium Risk (02/04/2023)    Readmission Risk Interventions     No data to display

## 2023-02-19 NOTE — Progress Notes (Signed)
Patient ID: [name], female   DOB: Oct 16, 1966, 57 y.o.   MRN: [id number] S: No complaints O:BP (!) 151/67 (BP Location: Right Arm)   Pulse 65   Temp 98.6 F (37 C) (Oral)   Resp 12   Ht 5\' 5"  (1.651 m)   Wt 95.7 kg   LMP 03/31/2014 (LMP Unknown)   SpO2 92%   BMI 35.11 kg/m   Intake/Output Summary (Last 24 hours) at 02/19/2023 1046 Last data filed at 02/18/2023 1919 Gross per 24 hour  Intake 250 ml  Output --  Net 250 ml   Intake/Output: I/O last 3 completed shifts: In: 250 [P.O.:250] Out: 0   Intake/Output this shift:  No intake/output data recorded. Weight change: -3.291 kg Gen: NAD CVS: RRR Resp: CTA Abd: +BS, soft, NT/ND Ext:no edema  Recent Labs  Lab 02/13/23 0409 02/14/23 0635 02/15/23 0540 02/15/23 1515 02/16/23 0345 02/17/23 0515 02/18/23 0136  NA 128* 131* 132* 129* 133* 134* 134*  K 4.0 3.7 4.3 4.2 4.1 4.5 4.4  CL 98 95* 95* 92* 94* 96* 96*  CO2 20* 26 27 25 29 27 29   GLUCOSE 211* 204* 172* 205* 181* 91 102*  BUN 94* 49* 71* 79* 40* 61* 35*  CREATININE 8.44* 5.14* 6.49* 7.23* 4.39* 6.30* 4.82*  ALBUMIN 2.2*  --  2.1* 2.3* 2.2* 2.2* 2.1*  CALCIUM 8.1* 8.0* 7.9* 7.8* 8.1* 8.1* 8.3*  PHOS 2.0* 1.9* 4.2 4.0  --   --   --   AST 35  --   --   --  30 24 24   ALT 20  --   --   --  27 24 23    Liver Function Tests: Recent Labs  Lab 02/16/23 0345 02/17/23 0515 02/18/23 0136  AST 30 24 24   ALT 27 24 23   ALKPHOS 159* 141* 146*  BILITOT 0.7 0.5 0.7  PROT 6.8 6.6 6.9  ALBUMIN 2.2* 2.2* 2.1*   No results for input(s): "LIPASE", "AMYLASE" in the last 168 hours. No results for input(s): "AMMONIA" in the last 168 hours. CBC: Recent Labs  Lab 02/14/23 0635 02/15/23 0540 02/15/23 1516 02/16/23 0345 02/17/23 0515 02/18/23 0136  WBC 17.7* 13.5* 13.8* 14.1* 11.9* 10.5  NEUTROABS 13.7*  --   --   --   --   --   HGB 9.6* 9.1* 8.9* 8.8* 8.4* 8.5*  HCT 27.7* 26.7* 26.2* 25.4* 25.1* 25.3*  MCV 91.7 94.0 93.2 92.0 94.4 95.5  PLT 235 228 229 209  231 239   Cardiac Enzymes: No results for input(s): "CKTOTAL", "CKMB", "CKMBINDEX", "TROPONINI" in the last 168 hours. CBG: Recent Labs  Lab 02/18/23 0620 02/18/23 1129 02/18/23 1628 02/18/23 2112 02/19/23 0601  GLUCAP 85 157* 127* 169* 160*    Iron Studies: No results for input(s): "IRON", "TIBC", "TRANSFERRIN", "FERRITIN" in the last 72 hours. Studies/Results: No results found.  aspirin  81 mg Oral Daily   carvedilol  25 mg Oral BID WC   Chlorhexidine Gluconate Cloth  6 each Topical Daily   darbepoetin (ARANESP) injection - NON-DIALYSIS  60 mcg Subcutaneous Q Wed-1800   docusate sodium  100 mg Oral Daily   dolutegravir  50 mg Oral Daily   emtricitabine-tenofovir AF  1 tablet Oral Daily   feeding supplement  1 Container Oral TID BM   heparin injection (subcutaneous)  5,000 Units Subcutaneous Q12H   hydrALAZINE  100 mg Oral Q8H   insulin aspart  0-15 Units Subcutaneous TID WC   insulin aspart  0-5  Units Subcutaneous QHS   levothyroxine  175 mcg Oral Q0600   lidocaine  1 patch Transdermal Q24H   multivitamin  1 tablet Oral QHS   omeprazole  40 mg Oral BID AC   mouth rinse  15 mL Mouth Rinse 4 times per day   rosuvastatin  10 mg Oral Daily   sertraline  50 mg Oral Daily    BMET    Component Value Date/Time   NA 134 (L) 02/18/2023 0136   NA 142 12/05/2022 0000   K 4.4 02/18/2023 0136   CL 96 (L) 02/18/2023 0136   CO2 29 02/18/2023 0136   GLUCOSE 102 (H) 02/18/2023 0136   BUN 35 (H) 02/18/2023 0136   BUN 44 (A) 12/05/2022 0000   CREATININE 4.82 (H) 02/18/2023 0136   CREATININE 2.72 (H) 07/27/2021 1110   CREATININE 2.74 (H) 03/17/2021 1050   CALCIUM 8.3 (L) 02/18/2023 0136   GFRNONAA 10 (L) 02/18/2023 0136   GFRNONAA 20 (L) 07/27/2021 1110   GFRAA 39 (L) 07/14/2020 1053   GFRAA 33 (L) 08/12/2019 1447   CBC    Component Value Date/Time   WBC 10.5 02/18/2023 0136   RBC 2.65 (L) 02/18/2023 0136   HGB 8.5 (L) 02/18/2023 0136   HGB 11.7 (L) 07/27/2021 1110    HCT 25.3 (L) 02/18/2023 0136   PLT 239 02/18/2023 0136   PLT 228 07/27/2021 1110   MCV 95.5 02/18/2023 0136   MCH 32.1 02/18/2023 0136   MCHC 33.6 02/18/2023 0136   RDW 16.9 (H) 02/18/2023 0136   LYMPHSABS 0.9 02/14/2023 0635   MONOABS 1.6 (H) 02/14/2023 0635   EOSABS 0.1 02/14/2023 0635   BASOSABS 0.1 02/14/2023 0635    Assessment/Plan:   New ESRD - s/p RIJ TDC on 01/30/23 and first HD session on 01/31/23 due to uremic symptoms.  Currently on MWF schedule and CLIP for outpatient HD underway.  She is no longer interested in PD and would like to do in-center HD.  Will need AVF/AVG once stabilized per [facility]VS note.  Plan for sometime prior to discharge. 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  Bp well controlled. Acute metabolic encephalopathy - improved with HD HTN - still above goal given aortic dissection.  Had increased carvedilol to 25 mg bid and hydralazine to 50 mg tid yesterday but still above goal.  Will increase hydralazine to 100 mg tid and follow bp response.  Goal BP <130/80.  Anemia of ESRD - hgb 5.9, plan for blood transfusion with HD.  S/p IV iron x 2 and on ESA. HIV - per primary svc SBO vs ileus -  having bowel movements.  Plan to remove NGT and start liquid diet per surgery. FEN - tube feeds paused   Hypophosphatemia - hold repletion given ESRD and will follow with advancing diet per surgery. Hyponatremia - UF with HD and follow response.   Irena Cords, MD [facility]

## 2023-02-20 DIAGNOSIS — I7102 Dissection of abdominal aorta: Secondary | ICD-10-CM | POA: Diagnosis not present

## 2023-02-20 DIAGNOSIS — N179 Acute kidney failure, unspecified: Secondary | ICD-10-CM | POA: Diagnosis not present

## 2023-02-20 LAB — COMPREHENSIVE METABOLIC PANEL
ALT: 17 U/L (ref 0–44)
AST: 19 U/L (ref 15–41)
Albumin: 2.1 g/dL — ABNORMAL LOW (ref 3.5–5.0)
Alkaline Phosphatase: 121 U/L (ref 38–126)
Anion gap: 14 (ref 5–15)
BUN: 56 mg/dL — ABNORMAL HIGH (ref 6–20)
CO2: 26 mmol/L (ref 22–32)
Calcium: 8.3 mg/dL — ABNORMAL LOW (ref 8.9–10.3)
Chloride: 98 mmol/L (ref 98–111)
Creatinine, Ser: 8.07 mg/dL — ABNORMAL HIGH (ref 0.44–1.00)
GFR, Estimated: 5 mL/min — ABNORMAL LOW (ref >60)
Glucose, Bld: 102 mg/dL — ABNORMAL HIGH (ref 70–99)
Potassium: 4.9 mmol/L (ref 3.5–5.1)
Sodium: 138 mmol/L (ref 135–145)
Total Bilirubin: 0.2 mg/dL — ABNORMAL LOW (ref 0.3–1.2)
Total Protein: 7.3 g/dL (ref 6.5–8.1)

## 2023-02-20 LAB — CBC
HCT: 26.5 % — ABNORMAL LOW (ref 36.0–46.0)
Hemoglobin: 8.9 g/dL — ABNORMAL LOW (ref 12.0–15.0)
MCH: 32.1 pg (ref 26.0–34.0)
MCHC: 33.6 g/dL (ref 30.0–36.0)
MCV: 95.7 fL (ref 80.0–100.0)
Platelets: 259 10*3/uL (ref 150–400)
RBC: 2.77 MIL/uL — ABNORMAL LOW (ref 3.87–5.11)
RDW: 16.9 % — ABNORMAL HIGH (ref 11.5–15.5)
WBC: 11 10*3/uL — ABNORMAL HIGH (ref 4.0–10.5)
nRBC: 0 % (ref 0.0–0.2)

## 2023-02-20 LAB — GLUCOSE, CAPILLARY
Glucose-Capillary: 101 mg/dL — ABNORMAL HIGH (ref 70–99)
Glucose-Capillary: 102 mg/dL — ABNORMAL HIGH (ref 70–99)
Glucose-Capillary: 92 mg/dL (ref 70–99)

## 2023-02-20 MED ORDER — HEPARIN SODIUM (PORCINE) 1000 UNIT/ML IJ SOLN
INTRAMUSCULAR | Status: AC
  Filled 2023-02-20: qty 4

## 2023-02-20 MED ORDER — HEPARIN SODIUM (PORCINE) 1000 UNIT/ML IJ SOLN
5000.0000 [IU] | Freq: Once | INTRAMUSCULAR | Status: AC
  Administered 2023-02-20: 5000 [IU] via INTRAVENOUS
  Filled 2023-02-20: qty 5

## 2023-02-20 NOTE — Progress Notes (Signed)
Nephrology Follow-Up Consult note   Assessment/Recommendations: [name] is a/an 57 y.o. female with a past medical history significant for sarcoidosis, CKD 4, DM2, admitted for aortic dissection.       New ESRD - s/p RIJ TDC on 01/30/23 and first HD session on 01/31/23 due to uremic symptoms.  Currently on MWF schedule and CLIP for outpatient HD underway.  She is no longer interested in PD and would like to do in-center HD.  Dr. Sherral Hammers saw on 5/1. Seems the plan was to readdress access outpatient at f/u.  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  Blood pressure remains high.  UF with dialysis today.  Adjust medications afterwards if needed Acute metabolic encephalopathy - improved with HD HTN - still above goal given aortic dissection.  Currently on carvedilol 25 mg twice daily and hydralazine 100 mg 3 times daily.  Ultrafiltration with dialysis today.  Assess blood pressure after dialysis and likely transition from hydralazine to amlodipine and start ARB in the near future. Anemia of ESRD -hemoglobin 8.9.  On ESA.  Status post IV iron HIV - per primary svc SBO vs ileus -advancing diet as able   Recommendations conveyed to primary service.    Darnell Level [facility] 02/20/2023 10:07 AM  ___________________________________________________________  CC: Aortic dissection  Interval History/Subjective: Patient feels fairly well today.  Now on the floor.  No complaints   Medications:  Current Facility-Administered Medications  Medication Dose Route Frequency Provider Last Rate Last Admin   0.9 %  sodium chloride infusion  250 mL Intravenous Continuous Lorin Glass, MD   Stopped at 02/10/23 1335   acetaminophen (TYLENOL) tablet 650 mg  650 mg Oral Q6H PRN Jerald Kief, MD   650 mg at 02/20/23 0912   aspirin chewable tablet 81 mg  81 mg Oral Daily Jerald Kief, MD   81 mg at 02/20/23 0744   bisacodyl (DULCOLAX)  suppository 10 mg  10 mg Rectal Daily PRN Jerald Kief, MD       carvedilol (COREG) tablet 25 mg  25 mg Oral BID WC Terrial Rhodes, MD   25 mg at 02/19/23 1751   Chlorhexidine Gluconate Cloth 2 % PADS 6 each  6 each Topical Daily Emilie Rutter, PA-C   6 each at 02/19/23 0815   Darbepoetin Alfa (ARANESP) injection 60 mcg  60 mcg Subcutaneous Q Wed-1800 Estanislado Emms, MD   60 mcg at 02/15/23 1810   docusate sodium (COLACE) capsule 100 mg  100 mg Oral Daily Jerald Kief, MD   100 mg at 02/20/23 0744   dolutegravir ([name]) tablet 50 mg  50 mg Oral Daily Jerald Kief, MD   50 mg at 02/19/23 1046   emtricitabine-tenofovir AF (DESCOVY) 200-25 MG per tablet 1 tablet  1 tablet Oral Daily Jerald Kief, MD   1 tablet at 02/19/23 1046   feeding supplement (BOOST / RESOURCE BREEZE) liquid 1 Container  1 Container Oral TID BM Jerald Kief, MD   1 Container at 02/19/23 2129   guaiFENesin-dextromethorphan (ROBITUSSIN DM) 100-10 MG/5ML syrup 15 mL  15 mL Oral Q4H PRN Emilie Rutter, PA-C   15 mL at 02/03/23 2047   heparin injection 5,000 Units  5,000 Units Subcutaneous Q12H Lynnell Catalan, MD   5,000 Units at 02/19/23 2128   hydrALAZINE (APRESOLINE) injection 10 mg  10 mg Intravenous Q4H PRN Emilie Rutter, PA-C   10 mg at 02/20/23 [phone]  hydrALAZINE (APRESOLINE) tablet 100 mg  100 mg Oral Q8H Terrial Rhodes, MD   100 mg at 02/19/23 2128   insulin aspart (novoLOG) injection 0-15 Units  0-15 Units Subcutaneous TID WC Jerald Kief, MD   3 Units at 02/19/23 1308   insulin aspart (novoLOG) injection 0-5 Units  0-5 Units Subcutaneous QHS Jerald Kief, MD   2 Units at 02/19/23 2130   levothyroxine (SYNTHROID) tablet 175 mcg  175 mcg Oral Q0600 Jerald Kief, MD   175 mcg at 02/20/23 0501   lidocaine (LIDODERM) 5 % 1 patch  1 patch Transdermal Q24H Lanier Clam, NP   1 patch at 02/19/23 1750   magnesium sulfate IVPB 2 g 50 mL  2 g Intravenous Daily PRN Emilie Rutter, PA-C        metoCLOPramide (REGLAN) injection 5 mg  5 mg Intravenous Q6H PRN Arrien, York Ram, MD   5 mg at 02/20/23 0745   multivitamin (RENA-VIT) tablet 1 tablet  1 tablet Oral QHS Jerald Kief, MD   1 tablet at 02/19/23 2128   omeprazole (PRILOSEC) capsule 40 mg  40 mg Oral BID AC Jerald Kief, MD   40 mg at 02/20/23 0744   ondansetron (ZOFRAN) injection 4 mg  4 mg Intravenous Q6H PRN Emilie Rutter, PA-C   4 mg at 02/19/23 0403   Oral care mouth rinse  15 mL Mouth Rinse 4 times per day Lorin Glass, MD   15 mL at 02/19/23 2129   Oral care mouth rinse  15 mL Mouth Rinse PRN Lorin Glass, MD       phenol (CHLORASEPTIC) mouth spray 1 spray  1 spray Mouth/Throat PRN Emilie Rutter, PA-C       rosuvastatin (CRESTOR) tablet 10 mg  10 mg Oral Daily Jerald Kief, MD   10 mg at 02/19/23 1046   sertraline (ZOLOFT) tablet 50 mg  50 mg Oral Daily Jerald Kief, MD   50 mg at 02/19/23 1047   simethicone (MYLICON) chewable tablet 80 mg  80 mg Oral QID PRN Conrad Stafford, MD   80 mg at 02/14/23 1727   traMADol (ULTRAM) tablet 50 mg  50 mg Oral Q12H PRN Lanier Clam, NP   50 mg at 02/20/23 6578   Facility-Administered Medications Ordered in Other Encounters  Medication Dose Route Frequency Provider Last Rate Last Admin   sodium chloride flush (NS) 0.9 % injection 10 mL  10 mL Intracatheter PRN Magrinat, Valentino Hue, MD   10 mL at 09/19/18 1551      Review of Systems: 10 systems reviewed and negative except per interval history/subjective  Physical Exam: Vitals:   02/20/23 0502 02/20/23 0815  BP: (!) 179/84 (!) 150/63  Pulse:  67  Resp: (!) 22 20  Temp: 98.3 F (36.8 C) 98.3 F (36.8 C)  SpO2:  98%   No intake/output data recorded.  Intake/Output Summary (Last 24 hours) at 02/20/2023 1007 Last data filed at 02/19/2023 1900 Gross per 24 hour  Intake 240 ml  Output --  Net 240 ml   Constitutional: well-appearing, no acute distress ENMT: ears and nose without scars or  lesions, MMM CV: normal rate, no edema Respiratory: Bilateral chest rise, normal work of breathing Gastrointestinal: soft, non-tender, no palpable masses or hernias Skin: no visible lesions or rashes Psych: alert, judgement/insight appropriate, appropriate mood and affect   Test Results I personally reviewed new and old clinical labs and radiology tests Lab Results  Component Value Date   NA 138 02/20/2023   K 4.9 02/20/2023   CL 98 02/20/2023   CO2 26 02/20/2023   BUN 56 (H) 02/20/2023   CREATININE 8.07 (H) 02/20/2023   GFR 11.21 (LL) 12/19/2022   GLU 121 12/05/2022   CALCIUM 8.3 (L) 02/20/2023   ALBUMIN 2.1 (L) 02/20/2023   PHOS 4.0 02/15/2023    CBC Recent Labs  Lab 02/14/23 0635 02/15/23 0540 02/17/23 0515 02/18/23 0136 02/20/23 0508  WBC 17.7*   < > 11.9* 10.5 11.0*  NEUTROABS 13.7*  --   --   --   --   HGB 9.6*   < > 8.4* 8.5* 8.9*  HCT 27.7*   < > 25.1* 25.3* 26.5*  MCV 91.7   < > 94.4 95.5 95.7  PLT 235   < > 231 239 259   < > = values in this interval not displayed.

## 2023-02-20 NOTE — Progress Notes (Signed)
Calorie Count Note   Diet: Regular Supplements: Boost Breeze, Magic Cup  Pt sitting up in bed, family at bedside.  Only 3 tickets within calorie count envelope from over the weekend.   5/4: "some" chicken salad (brought in by family) 5/5 breakfast: 100% oatmeal, 75% of fruit cup  She was also able to eat 2 chicken wings, some cornbread, and a few pieces of fried okra.  Not really drinking the [facility]. Continue to recommend family bring in food that pt desires.   Intervention:  1) Continue Regular Diet, encouraged small frequent meals. PO intake is improving. Encourage family to bring in outside food if pt desires.  2) Boost Breeze po TID, each supplement provides 250 kcal and 9 grams of protein 3) Magic cup BID with meals, each supplement provides 290 kcal and 9 grams of protein 4) Renal MVI daily 5) Discontinue calorie count   Kirby Crigler RD, LDN Clinical Dietitian See [facility] for contact information.

## 2023-02-20 NOTE — Progress Notes (Signed)
Progress Note   Patient: [name] UJW:[id number] DOB: [birth year] DOA: 01/29/2023     22 DOS: the patient was seen and examined on 02/20/2023   Brief hospital course: 57 yo female with the past medical history of sarcoidosis, CKD stage 4, and T2DM who presented with back pain, nausea and vomiting. Pain present for the last 2 days prior to hospitalization. On her initial physical examination her systolic blood pressure was >200 and she was started on esmolol drip, blood pressure 169/90, HR 63, RR 13 and 02 saturation 100%, lungs with no wheezing or rales, heart with S1 and S2 present and rhythmic, abdomen with no distention,. No lower extremity edema.    CT renal with findings suggestive of interval development of dissection the infrarenal abdominal aorta likely extending to the left common iliac artery. 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    CT angiography:  Type B acute aortic dissection arising in the proximal descending thoracic aorta distal to the origin of the left subclavian artery. There is a windsock sign in the mid descending thoracic aorta compatible with intimointimal intussusception. The dissection flap extends into the abdominal aorta, right common iliac artery, right external iliac artery, and left common femoral artery terminating at the bifurcation into the superficial and profunda femoral arteries.   The left internal iliac artery is not opacified likely occluded.    The celiac axis, left renal artery, and IMA arise from the true lumen. The right renal artery and probably the SMA arise from the false lumen.   Admitted to hospital 4/14, on cardene and esmolol 4/15 TDC placement, uremic encephalopathy 4/18 CT angio c/a/b, AO bifem. TEVAR  4/19 AKI on CKD requiring RRT  4/20 onward developed ileus vs SBO and intermittent pressor need 4/26 tolerating tube feeds and clear liquids   04/28 transferred to  [facility].  04/29 discontinue TPN, advanced po diet, continue with tube feedings for now.      Assessment and Plan:  Aortic dissection (HCC) Type B dissection s/p TEVAR (4/18) proximal descending thoracic aorta distal to origin of L subclav to iliacs, extending LLE to bifurcation of superficial and profunda fem arteries  Hx HTN Continue carvedilol to 25 mg twice daily with goal systolic blood pressure < 130s.  Hydralazine dose recently increased to 100mg  po q8h for better bp control   Post operative ileus, small bowel obstruction,  -Recent poor oral intake requiring supplemental NG tube feeding -Dietitian following. NG tube feeding removed 5/2 -continue on liberalized diet for now. 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 Hypokalemia/ pseudohyponatremia/hypophosphatemia.  She has temporary dialysis catheter placed on and started on HD in 04/16.  Patient currently on Monday Wednesday Friday schedule and cleared process began for outpatient HD. -It is noted per nephrology patient no longer interested in PD and would like to do in center HD. -Per vascular surgery, pt has one month f/u with CTA c/a/p and will discuss HD access at that time -Pt for HD today. Confirmed with Nephro, pt has been arranged outpt HD starting 5/8 at 12pm    Anemia of chronic renal disease. Post operative anemia,  sp PRBC transfusion 04.26 Iron deficiency anemia sp IV iron. Hgb trends remain stable -No signs of active bleeding.   Continued with EPO.    Encephalopathy acute -Likely secondary to uremia.   -  Significant clinical improvement/clinical resolution.     Swallow dysfunction,  Now on liberalized diet Continue nutritional support.    Depression, continue with sertraline.    Hypertension -Now on 25mg  bid carvedilol and 100mg  q8h hydralazine -goal bp <130/80.     Malnutrition of moderate degree  (HCC) -TPN discontinued. -Diet advanced to ad lib -Had been NG/tube feed dependent, removed NG 5/2.  -Pt reports doing better with PO intake   HIV (human immunodeficiency virus infection) (HCC) -Continue with antiretroviral therapy, -Noted to be positive for thrush and started on Diflucan which we will continue for now.  -Continue nystatin swish and swallow.   Type 2 diabetes mellitus with hyperlipidemia (HCC) -Hemoglobin A1c 6.0  (01/29/2023 ). -Continue SSI.   -Continue statin.    Hypothyroidism -Levothyroxine.    Ileus (HCC) -SBO versus ileus -Patient with clinical improvement, nausea improved, having bowel movements, passing flatus. -Ileus resolved  and General surgery has signed off as of 02/14/2023. -NG remains out. Cont with liberalized diet      Subjective: Complaining of back pains while laying in bed  Physical Exam: Vitals:   02/20/23 1600 02/20/23 1630 02/20/23 1700 02/20/23 1730  BP: (!) 169/78 (!) 172/94 (!) 162/74 (!) 176/75  Pulse: 60 65 60 65  Resp: 18 20 15 18   Temp:      TempSrc:      SpO2: 96% 95% 93% 95%  Weight:      Height:       General exam: Awake, laying in bed, in nad Respiratory system: Normal respiratory effort, no wheezing Cardiovascular system: regular rate, s1, s2 Gastrointestinal system: Soft, nondistended, positive BS Central nervous system: CN2-12 grossly intact, strength intact Extremities: Perfused, no clubbing Skin: Normal skin turgor, no notable skin lesions seen Psychiatry: Mood normal // no visual hallucinations   Data Reviewed:  Labs reviewed: Na 138, K 4.9, Cr 8.07, WBC 11.0, Hgb 8.9  Family Communication: Pt in room, family not at bedside  Disposition: Status is: Inpatient Remains inpatient appropriate because: Severity of illness  Planned Discharge Destination: Home    Author: Rickey Barbara, MD 02/20/2023 5:38 PM  For on call review [email or web address].

## 2023-02-20 NOTE — Progress Notes (Signed)
OT Cancellation Note  Patient Details Name: [name] MRN: [id number] DOB: [birth year]   Cancelled Treatment:    Reason Eval/Treat Not Completed: Patient declined, no reason specified (pt reports she already "got up and walked" this morning, does not feel well, declines bed level or EOB activity at this time. Per RN pt going for HD this PM, will follow up for OT tx as schedule permits.)  Carver Fila, OTD, OTR/L [employer] Preferred Acute Re[facility] [phone]   Carver Fila Koonce 02/20/2023, 2:10 PM

## 2023-02-20 NOTE — Progress Notes (Signed)
Physical Therapy Treatment Patient Details Name: Megana Mesnard MRN: [id number] DOB: 05-04-1966 Today's Date: 02/20/2023   History of Present Illness 57 y.o. female admitted 4/14 with c/o back and abdominal pain with N&V. 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 4/18 thoracic endovascular aortic repair. 4/23 SBO. PMH: T2DM, CKD, HIV, sarcoidosis    PT Comments    Received pt sitting in standard chair and agreeable to short session while waiting for dialysis. Pt performed all transfers with RW and supervision throughout session. Pt progressed with ambulation up to 223ft today with RW and close supervision with 1 standing rest break. Pt with mild SOB after ambulating that quickly resolved with seated rest break. Pt remains limited by generalized weakness/deconditioning, fatigue, and minor balance deficits.     Recommendations for follow up therapy are one component of a multi-disciplinary discharge planning process, led by the attending physician.  Recommendations may be updated based on patient status, additional functional criteria and insurance authorization.  Follow Up Recommendations       Assistance Recommended at Discharge Intermittent Supervision/Assistance  Patient can return home with the following A little help with walking and/or transfers;A little help with bathing/dressing/bathroom;Assistance with cooking/housework;Assist for transportation;Help with stairs or ramp for entrance   Equipment Recommendations  Rolling walker (2 wheels);BSC/3in1 (pending progress may or may not need 3in1)    Recommendations for Other Services       Precautions / Restrictions Precautions Precautions: Fall;Other (comment) Restrictions Weight Bearing Restrictions: No     Mobility  Bed Mobility               General bed mobility comments: sitting in standard chair upon entry and exit Patient Response:  Cooperative  Transfers Overall transfer level: Needs assistance Equipment used: Rolling walker (2 wheels) Transfers: Sit to/from Stand Sit to Stand: Supervision           General transfer comment: stood from standard chair without armrests using RW    Ambulation/Gait Ambulation/Gait assistance: Min guard, Supervision Gait Distance (Feet): 280 Feet Assistive device: Rolling walker (2 wheels) Gait Pattern/deviations: Step-through pattern, Decreased stride length Gait velocity: decreased Gait velocity interpretation: <1.8 ft/sec, indicate of risk for recurrent falls   General Gait Details: pt required 1 standing rest break while ambulating due to SOB - pt with good recall of pursed lip breathing technique.   Stairs             Wheelchair Mobility    Modified Rankin (Stroke Patients Only)       Balance Overall balance assessment: Needs assistance Sitting-balance support: No upper extremity supported, Feet supported Sitting balance-Leahy Scale: Good     Standing balance support: Bilateral upper extremity supported, During functional activity (RW) Standing balance-Leahy Scale: Fair Standing balance comment: able to maintain static and dynamic standing balance with supervision                            Cognition Arousal/Alertness: Awake/alert Behavior During Therapy: WFL for tasks assessed/performed Overall Cognitive Status: Within Functional Limits for tasks assessed                                 General Comments: family present at bedside        Exercises      General Comments General comments (skin integrity, edema,  etc.): pt agreeable to do short session while waiting for dialysis      Pertinent Vitals/Pain Pain Assessment Pain Assessment: 0-10 Pain Score: 7  Pain Location: back Pain Descriptors / Indicators: Discomfort, Grimacing Pain Intervention(s): Limited activity within patient's tolerance, Monitored during  session, Premedicated before session, Repositioned    Home Living                          Prior Function            PT Goals (current goals can now be found in the care plan section) Acute Rehab PT Goals Patient Stated Goal: to go home PT Goal Formulation: With patient Time For Goal Achievement: 02/18/23 Potential to Achieve Goals: Good Progress towards PT goals: Progressing toward goals    Frequency    Min 1X/week      PT Plan Current plan remains appropriate    Co-evaluation              AM-PAC PT "6 Clicks" Mobility   Outcome Measure  Help needed turning from your back to your side while in a flat bed without using bedrails?: None Help needed moving from lying on your back to sitting on the side of a flat bed without using bedrails?: A Little Help needed moving to and from a bed to a chair (including a wheelchair)?: A Little Help needed standing up from a chair using your arms (e.g., wheelchair or bedside chair)?: A Little Help needed to walk in hospital room?: A Little Help needed climbing 3-5 steps with a railing? : A Little 6 Click Score: 19    End of Session Equipment Utilized During Treatment: Gait belt Activity Tolerance: Patient tolerated treatment well Patient left: in chair;with call bell/phone within reach;with family/visitor present Nurse Communication: Mobility status PT Visit Diagnosis: Difficulty in walking, not elsewhere classified (R26.2);Muscle weakness (generalized) (M62.81);Pain Pain - part of body:  (back)     Time: 9147-8295 PT Time Calculation (min) (ACUTE ONLY): 11 min  Charges:  $Gait Training: 8-22 mins                     Blima Rich PT, DPT  Marlana Salvage Zaunegger 02/20/2023, 10:55 AM

## 2023-02-20 NOTE — Progress Notes (Signed)
  Progress Note    02/20/2023 3:23 PM 18 Days Post-Op  Subjective:  smiling, waxing and waning back pain, but not new   Vitals:   02/20/23 1136 02/20/23 1448  BP: (!) 186/60 (!) 168/74  Pulse: 60 61  Resp: 14 20  Temp: 98.3 F (36.8 C) 98.3 F (36.8 C)  SpO2: 95% 92%   Physical Exam: Cardiac:  regular Lungs:  non labored Extremities:palpable DP pulses bilaterally Neurologic: normal  CBC    Component Value Date/Time   WBC 11.0 (H) 02/20/2023 0508   RBC 2.77 (L) 02/20/2023 0508   HGB 8.9 (L) 02/20/2023 0508   HGB 11.7 (L) 07/27/2021 1110   HCT 26.5 (L) 02/20/2023 0508   PLT 259 02/20/2023 0508   PLT 228 07/27/2021 1110   MCV 95.7 02/20/2023 0508   MCH 32.1 02/20/2023 0508   MCHC 33.6 02/20/2023 0508   RDW 16.9 (H) 02/20/2023 0508   LYMPHSABS 0.9 02/14/2023 0635   MONOABS 1.6 (H) 02/14/2023 0635   EOSABS 0.1 02/14/2023 0635   BASOSABS 0.1 02/14/2023 0635    BMET    Component Value Date/Time   NA 138 02/20/2023 0508   NA 142 12/05/2022 0000   K 4.9 02/20/2023 0508   CL 98 02/20/2023 0508   CO2 26 02/20/2023 0508   GLUCOSE 102 (H) 02/20/2023 0508   BUN 56 (H) 02/20/2023 0508   BUN 44 (A) 12/05/2022 0000   CREATININE 8.07 (H) 02/20/2023 0508   CREATININE 2.72 (H) 07/27/2021 1110   CREATININE 2.74 (H) 03/17/2021 1050   CALCIUM 8.3 (L) 02/20/2023 0508   GFRNONAA 5 (L) 02/20/2023 0508   GFRNONAA 20 (L) 07/27/2021 1110   GFRAA 39 (L) 07/14/2020 1053   GFRAA 33 (L) 08/12/2019 1447    INR    Component Value Date/Time   INR 1.0 07/14/2019 0312     Intake/Output Summary (Last 24 hours) at 02/20/2023 1523 Last data filed at 02/19/2023 1900 Gross per 24 hour  Intake 240 ml  Output --  Net 240 ml      Assessment/Plan:  57 y.o. female is s/p TEVAR for TBAD 18 Days Post-Op   Current d/c barrier is PO intake - much improved with Dobbhoff out.   Will hold on HD access - will discuss in outpt setting once pts overall clinical status has improved.  Pt  scheduled for one month follow up with CTA c/a/p.    Victorino Sparrow MD [facility] [phone] 02/20/2023 3:23 PM

## 2023-02-20 NOTE — Procedures (Signed)
HD Note:  Some information was entered later than the data was gathered due to patient care needs. The stated time with the data is accurate.  Received patient in bed to unit.  Alert and oriented.  Informed consent signed and in chart.   TX duration: 3.5 hours  Patient had back pain during the treatment that increased significantly as the treatment concluded.  Transported back to the room  Alert, without acute distress.  Hand-off given to patient's nurse.   Access used: Right upper chest HD catheter Access issues: None  Total UF removed: 3000 ml    [facility]

## 2023-02-20 NOTE — Progress Notes (Signed)
Pt has been accepted at [facility] [place] on MWF 12:00 chair time. Pt can start on Wednesday and will need to arrive at 11:00 am to complete paperwork prior to treatment. Met with pt at bedside to discuss above details. Schedule letter provided as well. Pt requests that sister, Share, be contacted with information as well. Message left for pt's sister. Arrangements added to AVS and update provided to attending and nephrologist. Will assist as needed.   Olivia Canter Renal Navigator [phone]

## 2023-02-21 ENCOUNTER — Other Ambulatory Visit (HOSPITAL_COMMUNITY): Payer: Self-pay

## 2023-02-21 DIAGNOSIS — N179 Acute kidney failure, unspecified: Secondary | ICD-10-CM | POA: Diagnosis not present

## 2023-02-21 DIAGNOSIS — I161 Hypertensive emergency: Secondary | ICD-10-CM | POA: Diagnosis not present

## 2023-02-21 DIAGNOSIS — I7102 Dissection of abdominal aorta: Secondary | ICD-10-CM | POA: Diagnosis not present

## 2023-02-21 LAB — GLUCOSE, CAPILLARY
Glucose-Capillary: 88 mg/dL (ref 70–99)
Glucose-Capillary: 144 mg/dL — ABNORMAL HIGH (ref 70–99)

## 2023-02-21 MED ORDER — LOSARTAN POTASSIUM 50 MG PO TABS
50.0000 mg | ORAL_TABLET | Freq: Every day | ORAL | Status: DC
  Administered 2023-02-21: 50 mg via ORAL
  Filled 2023-02-21: qty 1

## 2023-02-21 MED ORDER — METOCLOPRAMIDE HCL 5 MG PO TABS
5.0000 mg | ORAL_TABLET | Freq: Four times a day (QID) | ORAL | 0 refills | Status: DC | PRN
  Filled 2023-02-21: qty 20, 5d supply, fill #0

## 2023-02-21 MED ORDER — AMLODIPINE BESYLATE 5 MG PO TABS
5.0000 mg | ORAL_TABLET | Freq: Every day | ORAL | Status: DC
  Administered 2023-02-21: 5 mg via ORAL
  Filled 2023-02-21: qty 1

## 2023-02-21 MED ORDER — ASPIRIN 81 MG PO CHEW
81.0000 mg | CHEWABLE_TABLET | Freq: Every day | ORAL | 0 refills | Status: AC
  Filled 2023-02-21: qty 30, 30d supply, fill #0

## 2023-02-21 MED ORDER — CYCLOBENZAPRINE HCL 5 MG PO TABS
5.0000 mg | ORAL_TABLET | Freq: Three times a day (TID) | ORAL | 0 refills | Status: DC | PRN
  Filled 2023-02-21: qty 15, 5d supply, fill #0

## 2023-02-21 MED ORDER — AMLODIPINE BESYLATE 5 MG PO TABS
5.0000 mg | ORAL_TABLET | Freq: Every day | ORAL | 0 refills | Status: DC
  Filled 2023-02-21: qty 30, 30d supply, fill #0

## 2023-02-21 MED ORDER — ONDANSETRON HCL 4 MG PO TABS
4.0000 mg | ORAL_TABLET | Freq: Every day | ORAL | 0 refills | Status: DC | PRN
  Filled 2023-02-21: qty 20, 20d supply, fill #0

## 2023-02-21 MED ORDER — SITAGLIPTIN PHOSPHATE 25 MG PO TABS
25.0000 mg | ORAL_TABLET | Freq: Every day | ORAL | 0 refills | Status: DC
  Filled 2023-02-21: qty 30, 30d supply, fill #0

## 2023-02-21 MED ORDER — LOSARTAN POTASSIUM 50 MG PO TABS
50.0000 mg | ORAL_TABLET | Freq: Every day | ORAL | 30 refills | Status: DC
  Filled 2023-02-21: qty 30, 30d supply, fill #0

## 2023-02-21 MED ORDER — TRAMADOL HCL 50 MG PO TABS
50.0000 mg | ORAL_TABLET | Freq: Four times a day (QID) | ORAL | 0 refills | Status: DC | PRN
  Filled 2023-02-21: qty 20, 5d supply, fill #0

## 2023-02-21 NOTE — Progress Notes (Signed)
Nephrology Follow-Up Consult note   Assessment/Recommendations: [name] is a/an 57 y.o. female with a past medical history significant for sarcoidosis, CKD 4, DM2, admitted for aortic dissection.       New ESRD - s/p RIJ TDC on 01/30/23 and first HD session on 01/31/23 due to uremic symptoms.  Currently on MWF schedule and CLIP'd to [facility]. She is no longer interested in PD and would like to do in-center HD.  Dr. Sherral Hammers saw on 5/1. Seems the plan was to readdress access outpatient at f/u.  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  Blood pressure remains high.  Stopped hydral and started amlodipine 5mg  daily and losartan 50mg  daily. CTM and UF w/ HD Acute metabolic encephalopathy - improved with HD HTN - still above goal given aortic dissection.  Currently on carvedilol 25 mg twice daily and hydralazine 100 mg 3 times daily.  Ultrafiltration with dialysis today.  Assess blood pressure after dialysis and likely transition from hydralazine to amlodipine and start ARB in the near future. Anemia of ESRD -hemoglobin 8.9.  On ESA.  Status post IV iron HIV - per primary svc SBO vs ileus -advancing diet as able Dispo: Patient has been arranged for dialysis at [facility] [place] MWF 12 PM chair time.  She can start as early as Wednesday.  Okay for discharge from nephrology perspective   Recommendations conveyed to primary service.    Darnell Level [facility] 02/21/2023 10:33 AM  ___________________________________________________________  CC: Aortic dissection  Interval History/Subjective: Patient feels well today with no complaints   Medications:  Current Facility-Administered Medications  Medication Dose Route Frequency Provider Last Rate Last Admin   0.9 %  sodium chloride infusion  250 mL Intravenous Continuous Lorin Glass, MD   Stopped at 02/10/23 1335   acetaminophen (TYLENOL) tablet 650 mg  650 mg Oral Q6H PRN Jerald Kief, MD   650 mg at 02/21/23 0845   amLODipine (NORVASC) tablet 5 mg  5 mg Oral Daily Darnell Level, MD   5 mg at 02/21/23 4782   aspirin chewable tablet 81 mg  81 mg Oral Daily Jerald Kief, MD   81 mg at 02/21/23 0846   bisacodyl (DULCOLAX) suppository 10 mg  10 mg Rectal Daily PRN Jerald Kief, MD       carvedilol (COREG) tablet 25 mg  25 mg Oral BID WC Terrial Rhodes, MD   25 mg at 02/21/23 0847   Chlorhexidine Gluconate Cloth 2 % PADS 6 each  6 each Topical Daily Emilie Rutter, PA-C   6 each at 02/21/23 0700   Darbepoetin Alfa (ARANESP) injection 60 mcg  60 mcg Subcutaneous Q Wed-1800 Estanislado Emms, MD   60 mcg at 02/15/23 1810   docusate sodium (COLACE) capsule 100 mg  100 mg Oral Daily Jerald Kief, MD   100 mg at 02/21/23 0846   dolutegravir ([name]) tablet 50 mg  50 mg Oral Daily Jerald Kief, MD   50 mg at 02/20/23 1935   emtricitabine-tenofovir AF (DESCOVY) 200-25 MG per tablet 1 tablet  1 tablet Oral Daily Jerald Kief, MD   1 tablet at 02/20/23 1935   feeding supplement (BOOST / RESOURCE BREEZE) liquid 1 Container  1 Container Oral TID BM Jerald Kief, MD   1 Container at 02/19/23 2129   guaiFENesin-dextromethorphan (ROBITUSSIN DM) 100-10 MG/5ML syrup 15 mL  15 mL Oral Q4H PRN Emilie Rutter,  PA-C   15 mL at 02/03/23 2047   heparin injection 5,000 Units  5,000 Units Subcutaneous Q12H Lynnell Catalan, MD   5,000 Units at 02/21/23 0847   hydrALAZINE (APRESOLINE) injection 10 mg  10 mg Intravenous Q4H PRN Emilie Rutter, PA-C   10 mg at 02/20/23 0038   insulin aspart (novoLOG) injection 0-15 Units  0-15 Units Subcutaneous TID WC Jerald Kief, MD   3 Units at 02/19/23 6578   insulin aspart (novoLOG) injection 0-5 Units  0-5 Units Subcutaneous QHS Jerald Kief, MD   2 Units at 02/19/23 2130   levothyroxine (SYNTHROID) tablet 175 mcg  175 mcg Oral Q0600 Jerald Kief, MD   175 mcg at 02/21/23 4696   lidocaine (LIDODERM) 5 % 1 patch  1 patch  Transdermal Q24H Lanier Clam, NP   1 patch at 02/20/23 1935   losartan (COZAAR) tablet 50 mg  50 mg Oral Daily Darnell Level, MD       magnesium sulfate IVPB 2 g 50 mL  2 g Intravenous Daily PRN Emilie Rutter, PA-C       metoCLOPramide (REGLAN) injection 5 mg  5 mg Intravenous Q6H PRN Arrien, York Ram, MD   5 mg at 02/21/23 0845   multivitamin (RENA-VIT) tablet 1 tablet  1 tablet Oral QHS Jerald Kief, MD   1 tablet at 02/20/23 2137   omeprazole (PRILOSEC) capsule 40 mg  40 mg Oral BID AC Jerald Kief, MD   40 mg at 02/21/23 0846   ondansetron (ZOFRAN) injection 4 mg  4 mg Intravenous Q6H PRN Emilie Rutter, PA-C   4 mg at 02/19/23 0403   Oral care mouth rinse  15 mL Mouth Rinse 4 times per day Lorin Glass, MD   15 mL at 02/21/23 0900   Oral care mouth rinse  15 mL Mouth Rinse PRN Lorin Glass, MD       phenol (CHLORASEPTIC) mouth spray 1 spray  1 spray Mouth/Throat PRN Emilie Rutter, PA-C       rosuvastatin (CRESTOR) tablet 10 mg  10 mg Oral Daily Jerald Kief, MD   10 mg at 02/21/23 0847   sertraline (ZOLOFT) tablet 50 mg  50 mg Oral Daily Jerald Kief, MD   50 mg at 02/21/23 0845   simethicone (MYLICON) chewable tablet 80 mg  80 mg Oral QID PRN Conrad Octa, MD   80 mg at 02/14/23 1727   traMADol (ULTRAM) tablet 50 mg  50 mg Oral Q12H PRN Lanier Clam, NP   50 mg at 02/21/23 0845   Facility-Administered Medications Ordered in Other Encounters  Medication Dose Route Frequency Provider Last Rate Last Admin   sodium chloride flush (NS) 0.9 % injection 10 mL  10 mL Intracatheter PRN Magrinat, Valentino Hue, MD   10 mL at 09/19/18 1551      Review of Systems: 10 systems reviewed and negative except per interval history/subjective  Physical Exam: Vitals:   02/21/23 0400 02/21/23 0810  BP: (!) 162/76 (!) 167/66  Pulse: 74 73  Resp:  20  Temp: 99 F (37.2 C) 98.2 F (36.8 C)  SpO2:  95%   No intake/output data recorded.  Intake/Output Summary  (Last 24 hours) at 02/21/2023 1033 Last data filed at 02/20/2023 1828 Gross per 24 hour  Intake --  Output 3 ml  Net -3 ml   Constitutional: well-appearing, no acute distress ENMT: ears and nose without scars or lesions, MMM CV: normal  rate, no edema Respiratory: Bilateral chest rise, normal work of breathing Gastrointestinal: soft, non-tender, no palpable masses or hernias Skin: no visible lesions or rashes Psych: alert, judgement/insight appropriate, appropriate mood and affect   Test Results I personally reviewed new and old clinical labs and radiology tests Lab Results  Component Value Date   NA 138 02/20/2023   K 4.9 02/20/2023   CL 98 02/20/2023   CO2 26 02/20/2023   BUN 56 (H) 02/20/2023   CREATININE 8.07 (H) 02/20/2023   GFR 11.21 (LL) 12/19/2022   GLU 121 12/05/2022   CALCIUM 8.3 (L) 02/20/2023   ALBUMIN 2.1 (L) 02/20/2023   PHOS 4.0 02/15/2023    CBC Recent Labs  Lab 02/17/23 0515 02/18/23 0136 02/20/23 0508  WBC 11.9* 10.5 11.0*  HGB 8.4* 8.5* 8.9*  HCT 25.1* 25.3* 26.5*  MCV 94.4 95.5 95.7  PLT 231 239 259

## 2023-02-21 NOTE — Progress Notes (Addendum)
Discharge instructions given. Patient verbalized understanding and all questions were answered.    Patient refused to wait for RW. Patient said she will come get it tomorrow.

## 2023-02-21 NOTE — Progress Notes (Addendum)
Pt to d/c to home today. Spoke to pt's sister, Share, via phone to confirm she is aware of pt's out-pt HD clinic and schedule per conversation with pt yesterday. Pt's sister aware of arrangements and aware pt will need to start at clinic tomorrow. Contacted renal PA to request that orders be sent to clinic. Out-pt HD arrangements placed on pt's AVS as well. Contacted [facility] to advise clinic of pt's d/c today and that pt will start tomorrow as planned.   Olivia Canter Renal Navigator [phone]

## 2023-02-21 NOTE — Discharge Summary (Signed)
Physician Discharge Summary   Patient: [name] MRN: [id number] DOB: 10/12/1966  Admit date:     01/29/2023  Discharge date: 02/21/23  Discharge Physician: Rickey Barbara   PCP: Doreene Nest, NP   Recommendations at discharge:    Follow up with PCP in 1-2 weeks Follow up with Vascular Surgery as scheduled Follow up with scheduled HD  Discharge Diagnoses: Principal Problem:   Aortic dissection (HCC) Active Problems:   AKI (acute kidney injury) (HCC)   Encephalopathy acute   Hypertension   Malnutrition of moderate degree (HCC)   HIV (human immunodeficiency virus infection) (HCC)   Type 2 diabetes mellitus with hyperlipidemia (HCC)   Hypothyroidism   Ileus (HCC)  Resolved Problems:   * No resolved hospital problems. *  Hospital Course: 57 yo female with the past medical history of sarcoidosis, CKD stage 4, and T2DM who presented with back pain, nausea and vomiting. Pain present for the last 2 days prior to hospitalization. On her initial physical examination her systolic blood pressure was >200 and she was started on esmolol drip, blood pressure 169/90, HR 63, RR 13 and 02 saturation 100%, lungs with no wheezing or rales, heart with S1 and S2 present and rhythmic, abdomen with no distention,. No lower extremity edema.    CT renal with findings suggestive of interval development of dissection the infrarenal abdominal aorta likely extending to the left common iliac artery. 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    CT angiography:  Type B acute aortic dissection arising in the proximal descending thoracic aorta distal to the origin of the left subclavian artery. There is a windsock sign in the mid descending thoracic aorta compatible with intimointimal intussusception. The dissection flap extends into the abdominal aorta, right common iliac artery, right external iliac artery, and left common femoral artery  terminating at the bifurcation into the superficial and profunda femoral arteries.   The left internal iliac artery is not opacified likely occluded.    The celiac axis, left renal artery, and IMA arise from the true lumen. The right renal artery and probably the SMA arise from the false lumen.   Admitted to hospital 4/14, on cardene and esmolol 4/15 TDC placement, uremic encephalopathy 4/18 CT angio c/a/b, AO bifem. TEVAR  4/19 AKI on CKD requiring RRT  4/20 onward developed ileus vs SBO and intermittent pressor need 4/26 tolerating tube feeds and clear liquids   04/28 transferred to [facility].  04/29 discontinue TPN, advanced po diet, continue with tube feedings for now.      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  Hydralazine dose recently increased to 100mg  po q8h for better bp control this visit, changed to norvasc 5mg  on d/c   Post operative ileus, small bowel obstruction,  -Recent poor oral intake requiring supplemental NG tube feeding -Dietitian following. NG tube feeding removed 5/2 -continue on liberalized diet for now. 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 Hypokalemia/ pseudohyponatremia/hypophosphatemia.  She has temporary dialysis catheter placed on and started on HD in 04/16.  Patient currently on Monday  Wednesday Friday schedule and cleared process began for outpatient HD. -It is noted per nephrology patient no longer interested in PD and would like to do in center HD. -Per vascular surgery, pt has one month f/u with CTA c/a/p and will discuss HD access at that time -Pt for HD today. Confirmed  with Nephro, pt has been arranged outpt HD starting 5/8 at 12pm    Anemia of chronic renal disease. Post operative anemia,  sp PRBC transfusion 04.26 Iron deficiency anemia sp IV iron. Hgb trends remain stable -No signs of active bleeding.   Continued with EPO.    Encephalopathy acute -Likely secondary to uremia.   -Significant clinical improvement/clinical resolution.     Swallow dysfunction,  Now on liberalized diet Continue nutritional support.    Depression, continue with sertraline.    Hypertension -Now on 25mg  bid carvedilol and 100mg  q8h hydralazine -goal bp <130/80.     Malnutrition of moderate degree (HCC) -TPN discontinued. -Diet advanced to ad lib -Had been NG/tube feed dependent, removed NG 5/2.  -Pt reports doing better with PO intake   HIV (human immunodeficiency virus infection) (HCC) -Continue with antiretroviral therapy, -Noted to be positive for thrush and started on Diflucan which we will continue for now.  -Continued nystatin swish and swallow.   Type 2 diabetes mellitus with hyperlipidemia (HCC) -Hemoglobin A1c 6.0  (01/29/2023 ). -Continue SSI.   -Continue statin.    Hypothyroidism -Levothyroxine.    Ileus (HCC) -SBO versus ileus -Patient with clinical improvement, nausea improved, having bowel movements, passing flatus. -Ileus resolved  and General surgery has signed off as of 02/14/2023. -NG remains out. Cont with liberalized diet       Consultants: General Surgery, Vascular Surgery, Nephrology Procedures performed: Thoracic endovascular aortic repair   Disposition: Home Diet recommendation:  Renal diet DISCHARGE MEDICATION: Allergies as of 02/21/2023       Reactions   Genvoya [elviteg-cobic-emtricit-tenofaf] Hives   Lisinopril Cough   Tape Rash   Rash Okay with tegaderm and paper tape   Aldactone [spironolactone] Hives   Tegaderm Ag Mesh [silver] Itching        Medication List     STOP taking these medications     candesartan 32 MG tablet Commonly known as: ATACAND   glipiZIDE 10 MG tablet Commonly known as: GLUCOTROL   hydrALAZINE 50 MG tablet Commonly known as: APRESOLINE       TAKE these medications    acetaminophen 500 MG tablet Commonly known as: TYLENOL Take 1,000 mg by mouth every 6 (six) hours as needed for mild pain.   albuterol 108 (90 Base) MCG/ACT inhaler Commonly known as: VENTOLIN HFA Inhale 2 puffs into the lungs every 6 (six) hours as needed for wheezing or shortness of breath.   allopurinol 100 MG tablet Commonly known as: ZYLOPRIM TAKE 1 TABLET BY MOUTH EVERY DAY FOR GOUT PREVENTION What changed: See the new instructions.   amLODipine 5 MG tablet Commonly known as: NORVASC Take 1 tablet (5 mg total) by mouth daily. Start taking on: Feb 22, 2023   aspirin 81 MG chewable tablet Chew 1 tablet (81 mg total) by mouth daily. Start taking on: Feb 22, 2023   carvedilol 25 MG tablet Commonly known as: COREG Take 1 tablet (25 mg total) by mouth 2 (two) times daily.   cetirizine 10 MG tablet Commonly known as: ZYRTEC TAKE 1 TABLET BY MOUTH AT BEDTIME FOR ALLERGIES What changed: See the new instructions.   cyclobenzaprine 5 MG tablet Commonly known  as: FLEXERIL Take 1 tablet (5 mg total) by mouth 3 (three) times daily as needed for muscle spasms.   Descovy 200-25 MG tablet Generic drug: emtricitabine-tenofovir AF TAKE 1 TABLET BY MOUTH EVERY DAY   Flovent HFA 110 MCG/ACT inhaler Generic drug: fluticasone TAKE 1 PUFF BY MOUTH TWICE A DAY What changed: See the new instructions.   fluticasone 50 MCG/ACT nasal spray Commonly known as: FLONASE Place 2 sprays into both nostrils daily.   FreeStyle Lite w/Device Kit Inject 1 each into the skin daily.   gabapentin 300 MG capsule Commonly known as: NEURONTIN Take 300 mg by mouth daily.   levothyroxine 175 MCG tablet Commonly known as: SYNTHROID TAKE 1 TABLET BY MOUTH EVERY DAY   losartan 50 MG  tablet Commonly known as: COZAAR Take 1 tablet (50 mg total) by mouth daily. Start taking on: Feb 22, 2023   metoCLOPramide 5 MG tablet Commonly known as: Reglan Take 1 tablet (5 mg total) by mouth every 6 (six) hours as needed for nausea.   minocycline 100 MG tablet Commonly known as: DYNACIN Take 1 tablet (100 mg total) by mouth 2 (two) times daily.   omeprazole 40 MG capsule Commonly known as: PRILOSEC Take 1 capsule (40 mg total) by mouth 2 (two) times daily before a meal.   ondansetron 4 MG tablet Commonly known as: Zofran Take 1 tablet (4 mg total) by mouth daily as needed for nausea or vomiting.   onetouch ultrasoft lancets Use as instructed to test blood sugar daily   OneTouch Verio test strip Generic drug: glucose blood USE AS INSTRUCTED TO TEST BLOOD SUGAR up to 3 times daily.   rosuvastatin 10 MG tablet Commonly known as: CRESTOR TAKE 1 TABLET BY MOUTH EVERY DAY FOR CHOLESTEROL What changed: See the new instructions.   sertraline 50 MG tablet Commonly known as: ZOLOFT TAKE 1 TABLET (50 MG TOTAL) BY MOUTH DAILY. FOR ANXIETY AND DEPRESSION.   sitaGLIPtin 25 MG tablet Commonly known as: Januvia Take 1 tablet (25 mg total) by mouth daily.   tamoxifen 20 MG tablet Commonly known as: NOLVADEX Take 1 tablet (20 mg total) by mouth daily.   [name] 50 MG tablet Generic drug: dolutegravir TAKE 1 TABLET BY MOUTH EVERY DAY        Follow-up Information     [facility] Follow up in 1 month(s).   Why: The office will call the patient with an appointment Contact information: [address] [place] [address] [phone]        [facility], [place]. Go on 02/22/2023.   Why: Schedule is Monday/Wednesday/Friday with 12:00 chair time.  For first appointment, please arrive at 11:00 am to complete paperwork prior to treatment. Contact information: [address] [place] [address] [phone]         Doreene Nest, NP Follow up in 2 week(s).   Specialty: Internal Medicine Why: Hospital follow up Contact information: [address] [place] [address] [phone]                Discharge Exam: Ceasar Mons Weights   02/20/23 1448 02/20/23 1828 02/21/23 0618  Weight: 95 kg 92 kg 91.6 kg   General exam: Awake, laying in bed, in nad Respiratory system: Normal respiratory effort, no wheezing Cardiovascular system: regular rate, s1, s2 Gastrointestinal system: Soft, nondistended, positive BS Central nervous system: CN2-12 grossly intact, strength intact Extremities: Perfused, no clubbing Skin: Normal skin turgor, no notable skin lesions seen Psychiatry:  Mood normal // no visual hallucinations   Condition at discharge: fair  The results of significant diagnostics from this hospitalization (including imaging, microbiology, ancillary and laboratory) are listed below for reference.   Imaging Studies: DG Abd Portable 1V  Result Date: 02/13/2023 CLINICAL DATA:  [id number] SBO (small bowel obstruction) (HCC) [id number] EXAM: PORTABLE ABDOMEN - 1 VIEW COMPARISON:  02/10/2023 FINDINGS: Feeding tube tip overlies the distal duodenum, unchanged. Previous nasogastric tube appears to have been removed. Partially visualized aortic stent and central venous catheters overlying the right atrium/superior cavoatrial junction. Persistent dilation of multiple loops of small bowel, not significantly changed from prior. IMPRESSION: Persistent small bowel obstruction. Feeding tube tip overlies the distal duodenum. Previous nasogastric tube appears to have been removed. Electronically Signed   By: Caprice Renshaw M.D.   On: 02/13/2023 12:18   VAS [place] UPPER EXT VEIN MAPPING (PRE-OP AVF)  Result Date: 02/10/2023 UPPER EXTREMITY VEIN MAPPING Patient Name:  [name] [facility]  Date of Exam:   02/09/2023 Medical Rec #: [id number]              Accession #:    [id number] Date of Birth: 01/30/1966              Patient Gender: F  Patient Age:   59 years Exam Location:  [facility] Procedure:      VAS [place] UPPER EXT VEIN MAPPING (PRE-OP AVF) Referring Phys: Emilie Rutter --------------------------------------------------------------------------------  Indications: Pre-access. Comparison Study: No previous study. Performing Technologist: McKayla Maag RVT, [place]  Examination Guidelines: A complete evaluation includes B-mode imaging, spectral Doppler, color Doppler, and power Doppler as needed of all accessible portions of each vessel. Bilateral testing is considered an integral part of a complete examination. Limited examinations for reoccurring indications may be performed as noted. +-----------------+-------------+----------+--------------+ Right Cephalic   Diameter (cm)Depth (cm)   Findings    +-----------------+-------------+----------+--------------+ Shoulder             0.18        1.59                  +-----------------+-------------+----------+--------------+ Prox upper arm       0.27        1.17                  +-----------------+-------------+----------+--------------+ Mid upper arm        0.22        1.06                  +-----------------+-------------+----------+--------------+ Dist upper arm       0.34        0.96                  +-----------------+-------------+----------+--------------+ Antecubital fossa    0.22        0.71                  +-----------------+-------------+----------+--------------+ Prox forearm                            not visualized +-----------------+-------------+----------+--------------+ Mid forearm                             not visualized +-----------------+-------------+----------+--------------+ Dist forearm                            not visualized +-----------------+-------------+----------+--------------+  Wrist                                   not visualized +-----------------+-------------+----------+--------------+  +-----------------+-------------+----------+--------------+ Right Basilic    Diameter (cm)Depth (cm)   Findings    +-----------------+-------------+----------+--------------+ Shoulder                                not visualized +-----------------+-------------+----------+--------------+ Prox upper arm       0.50        2.48   not visualized +-----------------+-------------+----------+--------------+ Mid upper arm        0.43        2.03                  +-----------------+-------------+----------+--------------+ Dist upper arm       0.28        1.94                  +-----------------+-------------+----------+--------------+ Antecubital fossa    0.27        0.93                  +-----------------+-------------+----------+--------------+ Prox forearm         0.23        0.30                  +-----------------+-------------+----------+--------------+ Mid forearm          0.19        0.27                  +-----------------+-------------+----------+--------------+ Distal forearm                             Thrombus    +-----------------+-------------+----------+--------------+ Wrist                                   not visualized +-----------------+-------------+----------+--------------+ Acute superficial thrombus is noted in the right basilic vein located at the distal forearm. +-----------------+-------------+----------+--------------+ Left Cephalic    Diameter (cm)Depth (cm)   Findings    +-----------------+-------------+----------+--------------+ Shoulder             0.26        1.99                  +-----------------+-------------+----------+--------------+ Prox upper arm       0.25        1.30                  +-----------------+-------------+----------+--------------+ Mid upper arm        0.32        0.91     branching    +-----------------+-------------+----------+--------------+ Dist upper arm                             Thrombus     +-----------------+-------------+----------+--------------+ Antecubital fossa                          Thrombus    +-----------------+-------------+----------+--------------+ Prox forearm  Thrombus    +-----------------+-------------+----------+--------------+ Mid forearm                                Thrombus    +-----------------+-------------+----------+--------------+ Dist forearm         0.14        0.34                  +-----------------+-------------+----------+--------------+ Wrist                                   not visualized +-----------------+-------------+----------+--------------+ +-----------------+-------------+----------+--------------+ Left Basilic     Diameter (cm)Depth (cm)   Findings    +-----------------+-------------+----------+--------------+ Prox upper arm       0.26        2.12                  +-----------------+-------------+----------+--------------+ Mid upper arm        0.22        1.99                  +-----------------+-------------+----------+--------------+ Dist upper arm       0.20        1.11                  +-----------------+-------------+----------+--------------+ Antecubital fossa    0.19        0.64                  +-----------------+-------------+----------+--------------+ Prox forearm         0.17        0.44                  +-----------------+-------------+----------+--------------+ Mid forearm          0.13        0.47                  +-----------------+-------------+----------+--------------+ Distal forearm       0.07        0.24                  +-----------------+-------------+----------+--------------+ Wrist                                   not visualized +-----------------+-------------+----------+--------------+ Acute superficial thrombus is noted in the left cephalic vein located at the distal upper arm to mid forearm. *See table(s) above for measurements  and observations.  Diagnosing physician: Gerarda Fraction Electronically signed by Gerarda Fraction on 02/10/2023 at 4:32:53 PM.    Final    DG Abd Portable 1V  Result Date: 02/10/2023 CLINICAL DATA:  Small bowel obstruction. EXAM: PORTABLE ABDOMEN - 1 VIEW COMPARISON:  One-view abdomen 02/09/2023 FINDINGS: A small were feeding tube terminates in the distal duodenum. NG tube is coiled in the stomach. Gas-filled loops of small bowel demonstrate similar dilation to the prior exam. No free air is present. Aortic stent graft is in place. Tip of the dialysis catheter is again seen the inferior cavoatrial junction. IMPRESSION: 1. Stable dilation of small bowel loops compatible with small bowel obstruction. 2. Support apparatus as above. 3. No significant interval change. Electronically Signed   By: Marin Roberts M.D.   On: 02/10/2023 08:21   DG Abd Portable 1V  Result Date: 02/09/2023 CLINICAL DATA:  Ileus EXAM: PORTABLE ABDOMEN - 1 VIEW COMPARISON:  Radiographs dated February 08, 2019 FINDINGS: Persistent dilated small bowel loops measuring up to 5 point 1 cm concerning for ileus and/or obstruction, unchanged. Feeding tube coursing below the diaphragm with distal tip projecting over the proximal jejunum, unchanged. 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 No acute osseous abnormality. Right basilar lung opacity is also unchanged. IMPRESSION: 1. Persistent dilated small bowel loops concerning for ileus and/or obstruction, unchanged. 2. No significant interval change. Electronically Signed   By: Larose Hires D.O.   On: 02/09/2023 08:51   DG Abd Portable 1V  Result Date: 02/08/2023 CLINICAL DATA:  Feeding tube placement EXAM: PORTABLE ABDOMEN - 1 VIEW COMPARISON:  02/08/2023 at 6:24 a.m. FINDINGS: A feeding tube is present with pattern of curvature indicating tube tip at the ligament of Treitz/fourth portion of the duodenum. A right dialysis catheter is present with its tip in the lower part of the right  atrium. Separate central line is present with tip at the cavoatrial junction. A nasogastric tube is present with a coil near its side-, and terminating in the stomach body. Continued airspace opacity obscuring the right lung base. Continued bandlike opacity the left lung base. Moderately dilated loops of upper abdominal small bowel are observed up to about 4.9 cm in diameter, nonspecific for obstruction versus ileus. Overlapping descending thoracic and upper abdominal aortic stents noted. IMPRESSION: 1. Feeding tube tip is at the ligament of Treitz/fourth portion of the duodenum. 2. Moderately dilated loops of upper abdominal small bowel, nonspecific for obstruction versus ileus. 3. Bibasilar airspace opacities, right greater than left, unchanged. 4. Overlapping descending thoracic and upper abdominal aortic stents noted. Electronically Signed   By: Gaylyn Rong M.D.   On: 02/08/2023 16:16   DG Chest Port 1 View  Result Date: 02/08/2023 CLINICAL DATA:  Dyspnea EXAM: PORTABLE CHEST 1 VIEW COMPARISON:  Chest x-ray 02/07/2023.  CT of the chest 02/07/2023 FINDINGS: Thoracic aortic graft/stent again seen. The heart is enlarged, unchanged. There is a small right pleural effusion, unchanged. There are patchy airspace opacities in the left lung base, unchanged. There is no pneumothorax or acute fracture identified. Right-sided central venous catheter tip projects over the right atrium which is similar to prior. There is a new left-sided central venous catheter with distal tip projecting over the distal SVC. Enteric tube extends below the diaphragm. IMPRESSION: 1. Stable cardiomegaly. 2. Stable small right pleural effusion. 3. Stable patchy airspace opacities in the left lung base. 4. New left-sided central venous catheter with distal tip projecting over the distal SVC. Electronically Signed   By: Darliss Cheney M.D.   On: 02/08/2023 14:12   DG Abd Portable 1V  Result Date: 02/08/2023 CLINICAL DATA:  Small-bowel  obstruction EXAM: PORTABLE ABDOMEN - 1 VIEW COMPARISON:  Portable exam 0624 hours compared to 02/07/2023 FINDINGS: Prior aortic stenting. Persistent air-filled distention of small bowel loops throughout abdomen no a small amount of colonic gas is seen. Findings could represent small-bowel obstruction or ileus. No definite bowel wall thickening. Bibasilar atelectasis. Enlargement of cardiac silhouette. Central line projects over RIGHT atrium. IMPRESSION: Persistent mild small bowel dilatation though some colonic gas is seen; pattern could reflect ileus or small-bowel obstruction. Bibasilar atelectasis. Electronically Signed   By: Ulyses Southward M.D.   On: 02/08/2023 08:48   CT Angio Chest/Abd/Pel for Dissection W and/or W/WO  Result Date: 02/07/2023 CLINICAL DATA:  Abdominal pain. Recent stent graft or aortic dissection. EXAM: CT ANGIOGRAPHY CHEST, ABDOMEN AND  PELVIS TECHNIQUE: Non-contrast CT of the chest was initially obtained. Multidetector CT imaging through the chest, abdomen and pelvis was performed using the standard protocol during bolus administration of intravenous contrast. Multiplanar reconstructed images and MIPs were obtained and reviewed to evaluate the vascular anatomy. RADIATION DOSE REDUCTION: This exam was performed according to the departmental dose-optimization program which includes automated exposure control, adjustment of the mA and/or kV according to patient size and/or use of iterative reconstruction technique. CONTRAST:  OMNIPAQUE IOHEXOL 350 MG/ML SOLN COMPARISON:  Multiple previous studies. FINDINGS: CTA CHEST FINDINGS Cardiovascular: The heart is borderline enlarged but stable. No pericardial effusion. Interval placement of an aortic stent graft. No worrisome complicating features are identified. The arch vessels are patent. There is some contrast flowing into the false lumen and extending down into the abdominal aorta. Mediastinum/Nodes: No mediastinal or hilar mass or  adenopathy. NG tube and feeding tube are coursing down the esophagus and into the stomach. Lungs/Pleura: Small bilateral pleural effusions and bibasilar atelectasis. No pulmonary edema or pneumothorax. Musculoskeletal: No significant bony findings. Review of the MIP images confirms the above findings. CTA ABDOMEN AND PELVIS FINDINGS VASCULAR Aorta: Upper abdominal aorta stent graft. This ends just above the celiac axis. Stable aortic dissection extending into the left common iliac, left external iliac artery and left common femoral artery. No occlusion. Celiac: Patent SMA: Patent Renals: Stable marked irregularity involving the proximal left renal artery but no occlusion. Is are enhancement pattern of both kidneys likely related to end-stage renal disease. IMA: Patent Inflow: Patent.  Stable left-sided dissection without occlusion. Veins: No significant findings. Review of the MIP images confirms the above findings. NON-VASCULAR Hepatobiliary: No hepatic lesions or intrahepatic biliary dilatation. Contracted gallbladder with probable gallstones. Pancreas: No inflammation. Spleen: Normal size. Adrenals/Urinary Tract: Stable mild enlargement of both adrenal glands likely hyperplasia. Is are enhancement pattern of both kidneys along with mild right-sided hydronephrosis likely due to end-stage renal disease. The bladder is unremarkable. Stomach/Bowel: Dilated small bowel loops with air-fluid levels with a transition to decompressed/normal distal ileal loops of small bowel suggesting small bowel obstruction likely related to adhesions. Mild distention of the ascending colon but the rest of the colon is largely decompressed and contains some contrast. Lymphatic: Stable borderline enlarged mesenteric and retroperitoneal lymph nodes and stable right inguinal lymphadenopathy. Reproductive: The uterus and ovaries are unremarkable. Other: Small amount of free abdominal and free pelvic fluid. Musculoskeletal: No significant  bony findings. Review of the MIP images confirms the above findings. IMPRESSION: 1. Interval placement of an aortic stent graft without worrisome complicating features. 2. 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 3. Aortic branch vessels are patent. 4. Small-bowel obstruction with transition noted in the distal ileum likely due to adhesions. 5. Small bilateral pleural effusions and bibasilar atelectasis. 6. Stable borderline enlarged mesenteric and retroperitoneal lymph nodes and stable right inguinal lymphadenopathy. Electronically Signed   By: Rudie Meyer M.D.   On: 02/07/2023 13:00   DG Chest Port 1 View  Result Date: 02/07/2023 CLINICAL DATA:  SOB (shortness of breath) EXAM: PORTABLE CHEST 1 VIEW COMPARISON:  Chest x-ray 10/13/2020. FINDINGS: Low lung volumes with bibasilar opacities and probable small right pleural effusion. No visible pneumothorax. Aortic stent. Mildly enlarged cardiac silhouette, accentuated by technique. Right IJ central venous catheter with the tip projecting at the lower right atrium. Enteric and gastric tube courses below the diaphragm outside the field of view. IMPRESSION: Low lung volumes  with bibasilar opacities and probable small right pleural effusion. Electronically Signed   By: Feliberto Harts M.D.   On: 02/07/2023 10:32   DG Abd 1 View  Result Date: 02/07/2023 CLINICAL DATA:  Shortness of breath. EXAM: ABDOMEN - 1 VIEW COMPARISON:  02/06/2023 FINDINGS: Persistent bibasilar collapse/consolidation, right greater than left with right-sided pleural effusion. Diffuse gaseous distension of small bowel again noted mildly progressive in the interval. Colonic distension seen on previous studies has decreased in the interval. NG tube tip is folded in the distal stomach with the tip in the antral region. Feeding tube tip is transpyloric left the level of the ligament of Treitz. IMPRESSION: 1.  Persistent bibasilar collapse/consolidation, right greater than left with right-sided pleural effusion. 2. Diffuse gaseous distension of small bowel mildly progressive in the interval. Colonic distension has decreased in the interval. Electronically Signed   By: Kennith Center M.D.   On: 02/07/2023 10:23   DG Abd Portable 1V  Result Date: 02/06/2023 CLINICAL DATA:  Evaluate feeding tube placement. EXAM: PORTABLE ABDOMEN - 1 VIEW COMPARISON:  Earlier today FINDINGS: There is a feeding tube identified within the upper abdomen which follows the normal course of the stomach and duodenal C loop with tip in the expected location of the ligament of Treitz. A second nasogastric scratch set a nasogastric tube is also noted with tip projecting over the expected location of the body of stomach. Thoracic aortic stent graft is again seen. Air-filled loops of small and large bowel appears similar to the previous exam. IMPRESSION: 1. Feeding tube tip projects over the expected location of the ligament of Treitz. 2. Nasogastric tube tip projects over the body of the stomach. Electronically Signed   By: Signa Kell M.D.   On: 02/06/2023 14:00   DG Abd 1 View  Result Date: 02/06/2023 CLINICAL DATA:  Nasogastric tube. EXAM: ABDOMEN - 1 VIEW COMPARISON:  Abdominal radiograph 02/05/2023 FINDINGS: Interval placement of nasogastric tube which is curled in the gastric fundus with the tip directed in the caudal direction. Enteric tube with weighted tip projects at the midline abdomen, overlying the expected location of the gastric antrum. Air-filled distended loops of small bowel and colon in the visualized portions of the left and mid abdomen. Aortic stent graft. Partially visualized superior approach central venous catheter with the tip projecting just below the level of the superior cavoatrial junction. IMPRESSION: Interval placement of a nasogastric tube which is curled in the gastric fundus, with the tip directed in the  caudal direction. Electronically Signed   By: Sherron Ales M.D.   On: 02/06/2023 12:28   DG Abd 1 View  Result Date: 02/05/2023 CLINICAL DATA:  [id number] Ileus [id number] EXAM: ABDOMEN - 1 VIEW COMPARISON:  CT 02/02/2023 FINDINGS: Descending thoracic aortic stent graft has been placed, proximal aspect not visualized. Feeding tube extends to the pylorus. A few gas filled small bowel loops in the left abdomen. Moderate gaseous distention of the colon. Residual contrast material in the urinary bladder. IMPRESSION: 1. Nonspecific gaseous distention of small bowel and colon. 2. Descending thoracic aortic stent graft. Electronically Signed   By: Corlis Leak M.D.   On: 02/05/2023 08:54   PERIPHERAL VASCULAR CATHETERIZATION  Result Date: 02/02/2023 See surgical note for result.  CT ANGIO AO+BIFEM W & OR WO CONTRAST  Result Date: 02/02/2023 CLINICAL DATA:  57 year old female with history of aortic dissection, first imaging diagnosis 01/29/2023 EXAM: CT ANGIOGRAPHY CHEST, ABDOMEN AND PELVIS TECHNIQUE: Multidetector CT imaging through the chest, abdomen and  pelvis was performed using the standard protocol during bolus administration of intravenous contrast. Multiplanar reconstructed images and MIPs were obtained and reviewed to evaluate the vascular anatomy. RADIATION DOSE REDUCTION: This exam was performed according to the departmental dose-optimization program which includes automated exposure control, adjustment of the mA and/or kV according to patient size and/or use of iterative reconstruction technique. CONTRAST:  OMNIPAQUE IOHEXOL 350 MG/ML SOLN COMPARISON:  01/29/2023, 01/31/2023 FINDINGS: CTA CHEST FINDINGS Cardiovascular: Heart: Heart appears enlarged. No pericardial fluid/thickening. No significant coronary calcifications. Aorta: Non-gated CT angiogram. No significant calcifications of the aortic valve. Redemonstration of acute type B aortic dissection. The dissection flap again originates beyond left  subclavian artery origin (zone 3). No evidence of retrograde extension. Estimated diameter of the ascending aorta 31 mm. Baseline CT during the hyperacute phase 01/29/2023 demonstrates diameter in segment 5 estimated 42 mm by my measurement. Current diameter in segment 5 (image 85 of series 3) estimated 42 mm, unchanged. Estimated diameter at the hiatus on the baseline CT 42 mm. Current diameter at the hiatus (image 134 of series 3) is estimated 42 mm, unchanged. The true lumen is partially fenestrated within the zone 4 and zone 5 of the aorta. True lumen is towards the patient's right at the hiatus. False lumen is partially thrombosed in zone 4 and 5. The false lumen is thrombosed at the aortic arch/zone 3. No periaortic fluid or inflammatory changes. Greatest estimated diameter of the aortic annulus 26 mm on the coronal reformatted images. Greatest estimated diameter of the sino-tubular junction, 24 mm on the coronal images. Pulmonary arteries: Timing of the contrast bolus is not optimized for evaluation of pulmonary artery filling defects. No central filling defect or filling defect within the left or right pulmonary arteries. Segmental and subsegmental pulmonary arteries are poorly characterized. Diameter of the main pulmonary artery unremarkable. Mediastinum/Nodes: No mediastinal adenopathy. Unremarkable appearance of the thoracic esophagus. Unremarkable appearance of the thoracic inlet. Lungs/Pleura: Low lung volumes. Atelectasis at the lung bases again demonstrated. Trace bilateral pleural effusion Central venous catheter again noted from right IJ approach. CTA ABDOMEN AND PELVIS FINDINGS VASCULAR Aorta: Similar appearance of the acute type B dissection, with extension of the flap through the abdominal aorta, terminating at the bifurcation. Diameter at the Visceral aorta is unchanged, estimated 33 mm. Juxtarenal aortic diameter is unchanged, estimated 24 mm. Infrarenal abdominal aorta without aneurysm. The  true lumen is towards the patient's right. The flap crosses the celiac artery, which remains perfused. True lumen perfuses the SMA. True lumen perfuses the right renal artery. The false lumen perfuses the left renal artery. False lumen perfuses the IMA. Celiac: Dissection flap crosses the celiac artery origin. The true lumen perfuses the celiac artery with branch vessels patent. SMA: SMA is patent, perfused from the true lumen. Branch vessels are patent. Renals: - Right: The single right renal artery is perfused from the true lumen. Mild atherosclerotic changes within the renal artery. True lumen diameter is narrowed at the level of the right renal artery origin. - Left: Single left renal artery. The left renal artery is perfused from the false channel. IMA: IMA is perfused from the false lumen.  IMA remains patent. Right lower extremity: Mild atherosclerotic changes of the right common iliac artery, hypogastric artery, external iliac artery. The dissection flap does not enter the right iliac system. No high-grade stenosis or occlusion. Common femoral artery is patent with minimal atherosclerosis. Profunda femoris and the thigh arteries are patent. Superficial femoral artery is patent throughout its  length with minimal atherosclerosis. Popliteal artery patent. No significant atherosclerosis. Typical trifurcation. All 3 tibial arteries are patent from the origin to the ankle. Posterior tibial artery is a small caliber artery throughout its length. Left lower extremity: The dissection flap traverses the length of the left iliac system, including common iliac artery and external iliac artery. The dissection flap appears to terminate in the proximal SFA. Left hypogastric artery is occluded at the origin from the dissection flap. Common femoral artery is patent. Profunda femoris and the thigh branches patent. Superficial femoral artery patent throughout its length with no significant atherosclerotic changes. Popliteal  artery patent. Typical trifurcation. All 3 tibial arteries are patent from the origin to the ankle with no significant tibial arterial disease. Veins: Unremarkable appearance of the venous system. Review of the MIP images confirms the above findings. NON-VASCULAR Hepatobiliary: Relatively unremarkable appearance of liver. High density material in the gallbladder, likely vicarious contrast excretion. Evidence of cholelithiasis noncalcified. Pancreas: Unremarkable. Spleen: Unremarkable. Adrenals/Urinary Tract: - Right adrenal gland: Right adrenal gland thickening. - Left adrenal gland: Left adrenal gland thickening. - Right kidney: Delayed right nephrogram, with some patchy enhancement likely related to poor flow in the setting of dissection flap. Retained contrast within the collecting system. No hydronephrosis. Renal cortical thinning. - Left Kidney: Delayed left nephrogram, with some patchy enhancement likely related to poor flow in the setting of the dissection flap. Retained contrast in the collecting system. Renal cortical thinning, though not as pronounced as the right. - Urinary Bladder: Excreted contrast within the urinary bladder. Stomach/Bowel: - Stomach: Gastric tube in place.  Otherwise unremarkable stomach. - Small bowel: Unremarkable bowel. The early arterial phase limits evaluation for decreased bowel blood flow, however, there is no distension or hypodensity within the bowel loops. - Appendix: Normal. - Colon: Oral contrast within the colon. Left-sided diverticular disease. No inflammatory changes. Lymphatic: No adenopathy. Mesenteric: No free fluid or air. No mesenteric adenopathy. Reproductive: Unremarkable appearance of the pelvic organs. Other: Fat containing umbilical hernia.  Body wall edema/anasarca. Musculoskeletal: No evidence of acute fracture. No bony canal narrowing. No significant degenerative changes of the hips. IMPRESSION: Similar appearance of acute type B dissection, baseline imaging  01/29/2023. Relatively unchanged diameter of the thoracic aorta (maximum estimated 4.2 cm as above), relatively unchanged perfusion of true lumen and false lumen (as above), and maintained in-line flow to the bilateral ankles without evidence of acute femoropopliteal or tibial occlusion. Known dissection flap extending through the left iliac system, now with collapse of the true lumen and interval occlusion/thrombosis of the left internal iliac artery. No evidence of retrograde extension of the dissection. Bilateral delayed nephrogram of the kidneys, indicating compromised flow from either the presence of dissection flap at the artery lumen (left) and/or dynamic obstruction (on the right). Mesenteric arteries maintained perfusion. Additional ancillary findings as above. Signed, Yvone Neu. Miachel Roux, RPVI [facility] Radiology Electronically Signed   By: Gilmer Mor D.O.   On: 02/02/2023 15:02   HYBRID OR IMAGING ([facility] ONLY)  Result Date: 02/02/2023 There is no interpretation for this exam.  This order is for images obtained during a surgical procedure.  Please See "Surgeries" Tab for more information regarding the procedure.   CT ANGIO CHEST AORTA W/CM &/OR WO/CM  Result Date: 02/02/2023 CLINICAL DATA:  Type B aortic dissection. EXAM: CT ANGIOGRAPHY CHEST WITH CONTRAST TECHNIQUE: Multidetector CT imaging of the chest was performed using the standard protocol during bolus administration of intravenous contrast. Multiplanar CT  image reconstructions and MIPs were obtained to evaluate the vascular anatomy. RADIATION DOSE REDUCTION: This exam was performed according to the departmental dose-optimization program which includes automated exposure control, adjustment of the mA and/or kV according to patient size and/or use of iterative reconstruction technique. CONTRAST:  OMNIPAQUE IOHEXOL 350 MG/ML SOLN COMPARISON:  Prior CT scan 01/29/2023 and 01/31/2023  FINDINGS: Cardiovascular: Stable mild cardiac enlargement but no pericardial effusion. Stable type B aortic dissection involving the descending thoracic aorta. No involvement of the arch vessels or ascending aorta. Scattered atherosclerotic calcifications. No definite coronary artery calcifications. Right IJ catheter is stable. Maximum diameter of the descending thoracic aorta is stable is stable measuring 3.9 x 3.8 cm. Mediastinum/Nodes: Feeding tube is coursing down the esophagus and into the stomach. No esophageal abnormality. Scattered mediastinal and hilar lymph nodes are stable. Lungs/Pleura: Moderate vascular congestion and suspected pulmonary edema with small pleural effusions and bibasilar atelectasis. Upper Abdomen: The aortic branch vessels are patent but there is persistent marked irregularity involving the proximal left renal artery. No significant upper abdominal findings. Musculoskeletal: No significant bony findings. Review of the MIP images confirms the above findings. IMPRESSION: 1. Stable type B aortic dissection involving the descending thoracic aorta. No involvement of the arch vessels or ascending aorta. 2. Stable mild cardiac enlargement. 3. Moderate vascular congestion and suspected pulmonary edema with small pleural effusions and bibasilar atelectasis. 4. Persistent marked irregularity involving the proximal left renal artery but no occlusion. Electronically Signed   By: Rudie Meyer M.D.   On: 02/02/2023 13:00   MR BRAIN WO CONTRAST  Result Date: 02/01/2023 CLINICAL DATA:  Delirium, acute encephalopathy EXAM: MRI HEAD WITHOUT CONTRAST TECHNIQUE: Multiplanar, multiecho pulse sequences of the brain and surrounding structures were obtained without intravenous contrast. COMPARISON:  None Available. FINDINGS: Brain: No restricted diffusion to suggest acute or subacute infarct. No acute hemorrhage, mass, mass effect, or midline shift. No hydrocephalus or extra-axial collection. Normal  pituitary and craniocervical junction. No hemosiderin deposition to suggest remote hemorrhage. Cerebral volume is within normal limits for age. Diffusely dilated perivascular spaces, most prominent in the cortex near the vertex (series 15, image 14). Vascular: Normal arterial flow voids. Skull and upper cervical spine: Normal marrow signal. Sinuses/Orbits: Clear paranasal sinuses. No acute finding in the orbits. Other: The mastoid air cells are well aerated. IMPRESSION: 1. No acute intracranial process. 2. Diffusely dilated perivascular spaces. Electronically Signed   By: Wiliam Ke M.D.   On: 02/01/2023 18:06   DG Abd Portable 1V  Result Date: 02/01/2023 CLINICAL DATA:  Encounter for feeding tube placement. EXAM: PORTABLE ABDOMEN - 1 VIEW COMPARISON:  Chest x-ray on 10/13/2020, abdominal CT on 01/31/2023 FINDINGS: Feeding tube has been placed, tip overlying the level of the distal stomach. Visualized bowel gas pattern is nonobstructive. Patient has a RIGHT IJ central line, tip overlying the level of the LOWER superior vena cava. IMPRESSION: Feeding tube tip overlying the level of the distal stomach. Electronically Signed   By: Norva Pavlov M.D.   On: 02/01/2023 14:35   CT Angio Chest/Abd/Pel for Dissection W and/or W/WO  Result Date: 01/31/2023 CLINICAL DATA:  Follow-up aortic dissection. History of breast cancer. EXAM: CT ANGIOGRAPHY CHEST, ABDOMEN AND PELVIS TECHNIQUE: Non-contrast CT of the chest was initially obtained. Multidetector CT imaging through the chest, abdomen and pelvis was performed using the standard protocol during bolus administration of intravenous contrast. Multiplanar reconstructed images and MIPs were obtained and reviewed to evaluate the vascular anatomy. RADIATION DOSE REDUCTION: This exam  was performed according to the departmental dose-optimization program which includes automated exposure control, adjustment of the mA and/or kV according to patient size and/or use of  iterative reconstruction technique. CONTRAST:  OMNIPAQUE IOHEXOL 350 MG/ML SOLN COMPARISON:  CTA chest abdomen pelvis 01/29/2023 and CT abdomen 06/03/2019 FINDINGS: CTA CHEST FINDINGS Cardiovascular: Limited evaluation of the aortic root and ascending thoracic aorta due to motion artifact and pulsation artifact. However, there is no clear evidence for aortic dissection involving the ascending thoracic aorta. Ascending thoracic aorta and great vessels are patent. Bovine type arch. Again noted is a complex aortic dissection involving distal to the left subclavian artery. There is evidence for intramural hematoma involving the distal aortic arch and proximal descending thoracic aorta. Distal aortic arch measures 3.5 cm and roughly measured 3.8 cm on the most recent comparison examination. Mid descending thoracic aorta measures up to 3.9 cm and stable. Aorta at the hiatus measures 3.7 cm and stable. The dissection extends into the abdominal aorta. Limited evaluation for pulmonary embolism on this examination. Heart size is stable. No significant pericardial effusion. Right jugular dialysis catheter has been placed and the tip is along the caudal aspect of the right atrium near the inferior cavoatrial junction. Mediastinum/Nodes: Again noted is prominent mediastinal tissue. Precarinal lymph node measures 1.4 cm in the short axis and similar to the recent comparison examination. There is soft tissue fullness in the right hilum and subcarinal region. Again noted are prominent axillary lymph nodes but similar findings dating back to a chest CT from 09/27/2018. Small supraclavicular lymph nodes. Lungs/Pleura: New filling defects in the dependent aspect of the trachea and mainstem bronchi most likely represents mucus. New patchy airspace densities in the anterior right upper lobe on image 49/7. Increased densities at the right lung base likely represent atelectasis. No large pleural effusions. New patchy densities in  the posterior left upper lobe. Patchy densities at the left lung base are again noted. Musculoskeletal: No acute bone abnormality. Review of the MIP images confirms the above findings. CTA ABDOMEN AND PELVIS FINDINGS VASCULAR Aorta: Aortic dissection extends down the length of the abdominal aorta. Proximal abdominal aorta just above the celiac artery measures 3.5 cm, previously measured 3.3 cm. Difficult to differentiate the true from the false lumen but suspect that the true lumen is on the right side. Lumen along the right side of the abdominal aorta has slightly enlarged compared to the previous examination. Infrarenal abdominal aorta measures 2.3 cm and stable. No periaortic stranding. No evidence for an aortic rupture. Celiac: Celiac trunk is patent and dissection flap is near the origin. Main celiac artery branches are patent. SMA: Originates from the right-sided lumen which may represent the true lumen. SMA is patent without significant stenosis. Renals: Right renal artery originates from the right-sided lumen and left renal artery originates from the left-sided lumen. Irregularity of the left renal artery which may be associated with the dissection. There is flow in the left renal artery. IMA: IMA appears to originate from the left-sided aortic lumen. IMA is patent. Inflow: There is flow in the common, internal and external iliac arteries bilaterally. The dissection appears to extend into the left common iliac artery and there is narrowing at the origin of the left internal iliac artery. Dissection extends into the left external iliac artery and left common femoral artery. Dissection does not clearly involve the right iliac arteries. Proximal outflow: Dissection involves the left common femoral artery and appears to terminate in the proximal aspect of the left  SFA. Left profunda femoral artery branches are patent. Proximal right femoral artery are patent. Veins: No obvious venous abnormality within the  limitations of this arterial phase study. Review of the MIP images confirms the above findings. NON-VASCULAR Hepatobiliary: Contrast within the gallbladder compatible with patient's renal failure. There is a filling defect in the gallbladder suggestive for a polyp or stone. No gallbladder distension or inflammatory changes. No gross abnormality to the liver. Pancreas: Unremarkable. No pancreatic ductal dilatation or surrounding inflammatory changes. Spleen: No significant contrast within the splenic parenchyma likely associated with slow flow from the dissection. Minimal perisplenic stranding on image 109/6. Adrenals/Urinary Tract: Chronic diffuse thickening in the left adrenal gland. There appears to be at least mild thickening of the right adrenal gland. Retained contrast within the right renal collecting system compatible with patient's kidney failure. Dilatation of the right renal collecting system likely related to patient's previous staghorn calculi in this area. No significant ureter dilatation. Iodinated contrast within the urinary bladder. Heterogeneous appearance of the left kidney is also compatible with patient's kidney disease. Stomach/Bowel: Normal appearance of the stomach. Proximal small loops are mildly dilated measuring up to 3.5 cm on image 106/6. Distal small bowel is decompressed. Right colon is moderately distended containing stool. There is mild swirling of the central mesentery which appears to be slightly different from the recent comparison examination. No focal bowel inflammation. Few diverticula involving the sigmoid colon. Lymphatic: Mildly prominent inguinal lymph nodes bilaterally. Multiple small lymph nodes in the periaortic retroperitoneum. Overall, the abdominal lymph nodes have not significantly changed since 2020. Reproductive: Uterus and bilateral adnexa are unremarkable. Other: No definite free fluid.  Negative for free air. Musculoskeletal: No acute bone abnormality. Scattered  areas of subcutaneous edema. Review of the MIP images confirms the above findings. IMPRESSION: Vascular: 1. 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 The aortic lumen along the right side of the abdominal aorta presumably represents the true lumen and this has slightly enlarged since 01/29/2023. Dissection does not clearly involve the ascending thoracic aorta. Proximal abdominal aorta has minimally enlarged in size measuring up to 3.5 cm. 2. Blood flow to the major visceral arteries but there is marked irregularity in the proximal left renal artery associated with the dissection and dissection appears to involve the origin of the celiac trunk. Suspect delayed or decreased flow to the spleen with mild perisplenic stranding. 3. 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 4. Interval placement of a right jugular dialysis catheter with the tip near the inferior cavoatrial junction. Nonvascular: 1. New dilatation of the proximal small bowel. There is new swirling or twisting of the central mesentery. Findings are nonspecific but could be associated with developing ileus or obstruction. Early ischemic changes cannot be excluded. Consider follow-up abdominal radiographs. 2. Vicarious excretion of contrast into the gallbladder compatible with patient's known renal failure. There is also retained contrast within the renal collecting systems related to the renal failure. 3. Filling defect in the gallbladder suggestive for a gallstone. 4. Patchy densities in both lungs could represent a combination of atelectasis and new airspace disease. Developing multifocal pneumonia cannot be excluded. 5. Prominent mediastinal tissue is nonspecific and recommend attention on follow-up imaging. These results were called by telephone at the time of  interpretation on 01/31/2023 at 6:07 pm to provider Vernona Rieger P. Chestine Spore, DO, who verbally acknowledged these  results. Electronically Signed   By: Richarda Overlie M.D.   On: 01/31/2023 18:08   IR Fluoro Guide CV Line Right  Result Date: 01/30/2023 INDICATION: END-STAGE RENAL DISEASE, ACUTE UREMIA EXAM: ULTRASOUND GUIDANCE FOR VASCULAR ACCESS RIGHT INTERNAL JUGULAR PERMANENT HEMODIALYSIS CATHETER Date:  01/30/2023 01/30/2023 4:09 pm Radiologist:  Judie Petit. Ruel Favors, MD Guidance:  Ultrasound and fluoroscopic FLUOROSCOPY: Fluoroscopy Time: 0 minutes 36 seconds (7 mGy). MEDICATIONS: 1% lidocaine local with epinephrine 2 g Ancef within 1 hour of the procedure ANESTHESIA/SEDATION: Versed 1.0 mg IV; Fentanyl 25 mcg IV; Moderate Sedation Time:  11 minute The patient was continuously monitored during the procedure by the interventional radiology nurse under my direct supervision. CONTRAST:  None. COMPLICATIONS: None immediate. PROCEDURE: Informed consent was obtained from the patient following explanation of the procedure, risks, benefits and alternatives. The patient understands, agrees and consents for the procedure. All questions were addressed. A time out was performed. Maximal barrier sterile technique utilized including caps, mask, sterile gowns, sterile gloves, large sterile drape, hand hygiene, and 2% chlorhexidine scrub. Under sterile conditions and local anesthesia, right internal jugular micropuncture venous access was performed with ultrasound. Images were obtained for documentation of the patent right internal jugular vein. A guide wire was inserted followed by a transitional dilator. Next, a 0.035 guidewire was advanced into the IVC with a 5-French catheter. Measurements were obtained from the right venotomy site to the proximal right atrium. In the right infraclavicular chest, a subcutaneous tunnel was created under sterile conditions and local anesthesia. 1% lidocaine with epinephrine was utilized for this. The 23 cm tip  to cuff palindrome catheter was tunneled subcutaneously to the venotomy site and inserted into the SVC/RA junction through a valved peel-away sheath. Position was confirmed with fluoroscopy. Images were obtained for documentation. Blood was aspirated from the catheter followed by saline and heparin flushes. The appropriate volume and strength of heparin was instilled in each lumen. Caps were applied. The catheter was secured at the tunnel site with Gelfoam and a pursestring suture. The venotomy site was closed with subcuticular Vicryl suture. Dermabond was applied to the small right neck incision. A dry sterile dressing was applied. The catheter is ready for use. No immediate complications. IMPRESSION: Ultrasound and fluoroscopically guided right internal jugular tunneled hemodialysis catheter (23 cm tip to cuff palindrome catheter). Electronically Signed   By: Judie Petit.  Shick M.D.   On: 01/30/2023 16:16   IR US Guide Vasc Access Right  Result Date: 01/30/2023 INDICATION: END-STAGE RENAL DISEASE, ACUTE UREMIA EXAM: ULTRASOUND GUIDANCE FOR VASCULAR ACCESS RIGHT INTERNAL JUGULAR PERMANENT HEMODIALYSIS CATHETER Date:  01/30/2023 01/30/2023 4:09 pm Radiologist:  Judie Petit. Ruel Favors, MD Guidance:  Ultrasound and fluoroscopic FLUOROSCOPY: Fluoroscopy Time: 0 minutes 36 seconds (7 mGy). MEDICATIONS: 1% lidocaine local with epinephrine 2 g Ancef within 1 hour of the procedure ANESTHESIA/SEDATION: Versed 1.0 mg IV; Fentanyl 25 mcg IV; Moderate Sedation Time:  11 minute The patient was continuously monitored during the procedure by the interventional radiology nurse under my direct supervision. CONTRAST:  None. COMPLICATIONS: None immediate. PROCEDURE: Informed consent was obtained from the patient following explanation of the procedure, risks, benefits and alternatives. The patient understands, agrees and consents for the procedure. All questions were addressed. A time out was performed. Maximal barrier sterile technique utilized  including caps, mask, sterile gowns, sterile gloves, large sterile drape, hand hygiene, and 2% chlorhexidine scrub. Under sterile conditions and local anesthesia, right internal jugular micropuncture venous access was performed with ultrasound. Images were obtained for documentation of the  patent right internal jugular vein. A guide wire was inserted followed by a transitional dilator. Next, a 0.035 guidewire was advanced into the IVC with a 5-French catheter. Measurements were obtained from the right venotomy site to the proximal right atrium. In the right infraclavicular chest, a subcutaneous tunnel was created under sterile conditions and local anesthesia. 1% lidocaine with epinephrine was utilized for this. The 23 cm tip to cuff palindrome catheter was tunneled subcutaneously to the venotomy site and inserted into the SVC/RA junction through a valved peel-away sheath. Position was confirmed with fluoroscopy. Images were obtained for documentation. Blood was aspirated from the catheter followed by saline and heparin flushes. The appropriate volume and strength of heparin was instilled in each lumen. Caps were applied. The catheter was secured at the tunnel site with Gelfoam and a pursestring suture. The venotomy site was closed with subcuticular Vicryl suture. Dermabond was applied to the small right neck incision. A dry sterile dressing was applied. The catheter is ready for use. No immediate complications. IMPRESSION: Ultrasound and fluoroscopically guided right internal jugular tunneled hemodialysis catheter (23 cm tip to cuff palindrome catheter). Electronically Signed   By: Judie Petit.  Shick M.D.   On: 01/30/2023 16:16   VAS US RENAL ARTERY DUPLEX  Result Date: 01/30/2023 ABDOMINAL VISCERAL Patient Name:  [name] [facility]  Date of Exam:   01/30/2023 Medical Rec #: [id number]              Accession #:    [id number] Date of Birth: 10/02/1966              Patient Gender: F Patient Age:   37 years Exam Location:   [facility] Procedure:      VAS US RENAL ARTERY DUPLEX Referring Phys: [id number] JOSHUA E ROBINS -------------------------------------------------------------------------------- Indications: Type B aortic dissection. High Risk Factors: Hypertension, hyperlipidemia, Diabetes. Other Factors: Chronic kidney disease and poorly controlled hypertension. GERD. Limitations: Obesity, air/bowel gas and Technically difficult/limited study due to patient body habitus. Comparison Study: 01/29/23 CT Angio CHEST/ABD/PEL showed dissection throughout                   the aorta into the iliac arteries, origin of the celiac and                   SMA. Performing Technologist: Marilynne Halsted RDMS, RVT  Examination Guidelines: A complete evaluation includes B-mode imaging, spectral Doppler, color Doppler, and power Doppler as needed of all accessible portions of each vessel. Bilateral testing is considered an integral part of a complete examination. Limited examinations for reoccurring indications may be performed as noted.  Duplex Findings: +--------------------+--------+--------+------+----------+ Mesenteric          PSV cm/sEDV cm/sPlaque Comments  +--------------------+--------+--------+------+----------+ Aorta Prox             94                 dissection +--------------------+--------+--------+------+----------+ Aorta Mid                                 dissection +--------------------+--------+--------+------+----------+ Aorta Distal                              dissection +--------------------+--------+--------+------+----------+ Celiac Artery Origin  386                            +--------------------+--------+--------+------+----------+  SMA Proximal          313                            +--------------------+--------+--------+------+----------+    +------------------+--------+--------+-------------+ Right Renal ArteryPSV cm/sEDV cm/s   Comment     +------------------+--------+--------+-------------+ Origin                            No visualized +------------------+--------+--------+-------------+ Proximal             77      20                 +------------------+--------+--------+-------------+ Mid                  92      7                  +------------------+--------+--------+-------------+ Distal               89      9                  +------------------+--------+--------+-------------+ +-----------------+--------+--------+--------------+ Left Renal ArteryPSV cm/sEDV cm/s   Comment     +-----------------+--------+--------+--------------+ Origin                           not visualized +-----------------+--------+--------+--------------+ Proximal            75      8                   +-----------------+--------+--------+--------------+ Mid                 74      10                  +-----------------+--------+--------+--------------+ Distal              86      18                  +-----------------+--------+--------+--------------+  Technologist observations: Incidental finding - gallstone. +------------+--------+--------+----+-----------+--------+--------+----+ Right KidneyPSV cm/sEDV cm/sRI  Left KidneyPSV cm/sEDV cm/sRI   +------------+--------+--------+----+-----------+--------+--------+----+ Upper Pole  66      0       1.00Upper Pole 26      7       0.72 +------------+--------+--------+----+-----------+--------+--------+----+ Mid         65      0       1.        41      8       0.80 +------------+--------+--------+----+-----------+--------+--------+----+ Lower Pole  26      0       1.00Lower Pole                      +------------+--------+--------+----+-----------+--------+--------+----+ Hilar       37      10      0.73Hilar      36      11      0.70 +------------+--------+--------+----+-----------+--------+--------+----+  +------------------+-----+------------------+-----+ Right Kidney           Left Kidney             +------------------+-----+------------------+-----+ RAR                    RAR                     +------------------+-----+------------------+-----+  RAR (manual)      0.82 RAR (manual)      0.79  +------------------+-----+------------------+-----+ Cortex                 Cortex                  +------------------+-----+------------------+-----+ Cortex thickness       Corex thickness         +------------------+-----+------------------+-----+ Kidney length (cm)12.72Kidney length (cm)11.90 +------------------+-----+------------------+-----+  Summary: Renal:  Right: Patent right renal artery with high resistance waveforms        noted throughout without signifcant stenosis. Abnormal right        resisitive index with increased echogenicity within the renal        parenchyma. Left:  Patent left renal artery with high resistance waveforms noted        throughout without signifcant stenosis. Abnormal left        resisitive index with increased echogenicity within the renal        parenchyma. Mesenteric:  Aortic dissection was noted.  *See table(s) above for measurements and observations.  Diagnosing physician: Sherald Hess MD  Electronically signed by Sherald Hess MD on 01/30/2023 at 2:57:56 PM.    Final    ECHOCARDIOGRAM COMPLETE  Result Date: 01/30/2023    ECHOCARDIOGRAM REPORT   Patient Name:   [name] [facility] Date of Exam: 01/30/2023 Medical Rec #:  [id number]             Height:       65.0 in Accession #:    [id number]            Weight:       221.3 lb Date of Birth:  11-24-1965             BSA:          2.065 m Patient Age:    56 years              BP:           110/53 mmHg Patient Gender: F                     HR:           68 bpm. Exam Location:  Inpatient Procedure: 2D Echo, Cardiac Doppler and Color Doppler Indications:    Dissection of abdominal aorta I71.02   History:        Patient has prior history of Echocardiogram examinations, most                 recent 02/20/2020. CKD 4 and Aortic Dissection; Risk                 Factors:Hypertension, Diabetes, Dyslipidemia and Former Smoker.  Sonographer:    Dondra Prader RVT RCS Referring Phys: [id number] SUDHAM CHAND IMPRESSIONS  1. Left ventricular ejection fraction, by estimation, is 55 to 60%. The left ventricle has normal function. The left ventricle has no regional wall motion abnormalities. There is mild concentric left ventricular hypertrophy. Left ventricular diastolic parameters are consistent with Grade II diastolic dysfunction (pseudonormalization).  2. Right ventricular systolic function is normal. The right ventricular size is normal. Tricuspid regurgitation signal is inadequate for assessing PA pressure.  3. Left atrial size was mildly dilated.  4. The mitral valve is normal in structure. Mild to moderate mitral valve regurgitation. No evidence of mitral stenosis.  5. The aortic valve is calcified. Aortic valve regurgitation is not visualized. Aortic  valve sclerosis/calcification is present, without any evidence of aortic stenosis.  6. There appears a flap in the abdominal aorta concerning for abdominal aortic dissection - would recommend further testing with Abdominal CTA or MRA.  7. The inferior vena cava is dilated in size with <50% respiratory variability, suggesting right atrial pressure of 15 mmHg. FINDINGS  Left Ventricle: Left ventricular ejection fraction, by estimation, is 55 to 60%. The left ventricle has normal function. The left ventricle has no regional wall motion abnormalities. The left ventricular internal cavity size was normal in size. There is  mild concentric left ventricular hypertrophy. Left ventricular diastolic parameters are consistent with Grade II diastolic dysfunction (pseudonormalization). Right Ventricle: The right ventricular size is normal. No increase in right ventricular wall thickness.  Right ventricular systolic function is normal. Tricuspid regurgitation signal is inadequate for assessing PA pressure. Left Atrium: Left atrial size was mildly dilated. Right Atrium: Right atrial size was normal in size. Pericardium: There is no evidence of pericardial effusion. Mitral Valve: The mitral valve is normal in structure. Mild to moderate mitral valve regurgitation. No evidence of mitral valve stenosis. Tricuspid Valve: The tricuspid valve is normal in structure. Tricuspid valve regurgitation is mild . No evidence of tricuspid stenosis. Aortic Valve: The aortic valve is calcified. Aortic valve regurgitation is not visualized. Aortic valve sclerosis/calcification is present, without any evidence of aortic stenosis. Aortic valve mean gradient measures 6.5 mmHg. Aortic valve peak gradient measures 11.9 mmHg. Aortic valve area, by VTI measures 2.36 cm. Pulmonic Valve: The pulmonic valve was normal in structure. Pulmonic valve regurgitation is trivial. No evidence of pulmonic stenosis. Aorta: There appears a flap in the abdominal aorta concerning for abdominal aortic dissection - would recommend further testing with Abdominal CTA or MRA. Venous: The inferior vena cava is dilated in size with less than 50% respiratory variability, suggesting right atrial pressure of 15 mmHg. IAS/Shunts: No atrial level shunt detected by color flow Doppler.  LEFT VENTRICLE PLAX 2D LVIDd:         5.40 cm   Diastology LVIDs:         3.50 cm   LV e' medial:    5.33 cm/s LV PW:         1.30 cm   LV E/e' medial:  22.5 LV IVS:        1.30 cm   LV e' lateral:   6.00 cm/s LVOT diam:     2.00 cm   LV E/e' lateral: 20.0 LV SV:         86 LV SV Index:   42 LVOT Area:     3.14 cm  RIGHT VENTRICLE             IVC RV Basal diam:  3.60 cm     IVC diam: 2.70 cm RV S prime:     13.80 cm/s TAPSE (M-mode): 2.5 cm LEFT ATRIUM              Index        RIGHT ATRIUM           Index LA diam:        4.40 cm  2.13 cm/m   RA Area:     16.30 cm LA Vol  (A2C):   104.0 ml 50.36 ml/m  RA Volume:   45.00 ml  21.79 ml/m LA Vol (A4C):   60.3 ml  29.20 ml/m LA Biplane Vol: 81.9 ml  39.66 ml/m  AORTIC VALVE  PULMONIC VALVE AV Area (Vmax):    2.46 cm      PV Vmax:       1.10 m/s AV Area (Vmean):   2.37 cm      PV Peak grad:  4.9 mmHg AV Area (VTI):     2.36 cm AV Vmax:           172.50 cm/s AV Vmean:          113.000 cm/s AV VTI:            0.366 m AV Peak Grad:      11.9 mmHg AV Mean Grad:      6.5 mmHg LVOT Vmax:         135.00 cm/s LVOT Vmean:        85.300 cm/s LVOT VTI:          0.275 m LVOT/AV VTI ratio: 0.75  AORTA Ao Root diam: 2.80 cm Ao Asc diam:  3.30 cm Ao Arch diam: 2.9 cm MITRAL VALVE MV Area (PHT): 2.62 cm     SHUNTS MV Decel Time: 289 msec     Systemic VTI:  0.28 m MV E velocity: 120.00 cm/s  Systemic Diam: 2.00 cm MV A velocity: 104.00 cm/s MV E/A ratio:  1.15 Kardie Tobb DO Electronically signed by Thomasene Ripple DO Signature Date/Time: 01/30/2023/2:00:43 PM    Final    CT Angio Chest/Abd/Pel for Dissection W and/or Wo Contrast  Result Date: 01/29/2023 CLINICAL DATA:  Abdominal pain and back pain, rule out dissection EXAM: CT ANGIOGRAPHY CHEST, ABDOMEN AND PELVIS TECHNIQUE: CT renal stone protocol 01/29/2023 and CT abdomen and pelvis 08/31/2022 Multidetector CT imaging through the chest, abdomen and pelvis was performed using the standard protocol during bolus administration of intravenous contrast. Multiplanar reconstructed images and MIPs were obtained and reviewed to evaluate the vascular anatomy. RADIATION DOSE REDUCTION: This exam was performed according to the departmental dose-optimization program which includes automated exposure control, adjustment of the mA and/or kV according to patient size and/or use of iterative reconstruction technique. CONTRAST:  OMNIPAQUE IOHEXOL 350 MG/ML SOLN COMPARISON:  None Available. FINDINGS: CTA CHEST FINDINGS Cardiovascular: Acute aortic dissection arising in the proximal  descending thoracic aorta distal to the origin of the left subclavian artery. There is a windsock sign in the mid thoracic descending aorta (circa series 5/image 81) compatible with intimointimal intussusception. The dissection flap extends along the right aortic sidewall into the abdominal aorta. The lower thoracic descending aorta near the hiatus measures 44 mm in diameter and is similar in size to CT 01/29/2023 at 1:04 a.m. given differences in measuring technique. Mild wall thickening of the wall of the aortic arch. Small pericardial effusion. Cardiomegaly. The main pulmonary artery is mildly dilated measuring 35 mm in diameter. Mediastinum/Nodes: Unremarkable esophagus. Prominent mediastinal lymph nodes. For example a right paratracheal node measures 1.5 cm in short axis (5/51). These are similar to slightly increased from 09/27/2018 and favored reactive. Normal thyroid. Lungs/Pleura: Interlobular septal thickening greatest in the lower lungs with patchy ground-glass opacities peripherally. Atelectasis or consolidation in the left lower lobe. Musculoskeletal: No acute abnormality. Review of the MIP images confirms the above findings. CTA ABDOMEN AND PELVIS FINDINGS VASCULAR Aorta: The type B descending thoracic aortic dissection continues into the abdomen extending along the right lateral wall of the aorta likely into the right common iliac artery, external iliac artery, and into the common femoral artery with termination at the bifurcation into the superficial femoral and profunda femoral artery. Celiac: The  dissection flap abuts the celiac axis which is patent and arises from the true lumen. SMA: The dissection flap appears to cover the origin of the SMA which appears to arise from the false lumen. The SMA is well opacified. Renals: The left renal artery is patent and arises from the true lumen. The right renal artery arises from the false lumen and is patent and opacified. IMA: The IMA is patent and arises  from the true lumen. Inflow: The dissection flap continues through the left common iliac, external iliac, and common femoral artery terminating at the bifurcation into the superficial and profunda femoral arteries. The left internal iliac artery is not opacified likely occluded. , Definitive the right common iliac artery appears to arise from the true lumen. The right common iliac, external iliac, and internal iliac arteries are patent. Common femoral artery is patent on the right. Veins: No obvious venous abnormality within the limitations of this arterial phase study. Review of the MIP images confirms the above findings. NON-VASCULAR Hepatobiliary: No focal liver abnormality is seen. No gallstones, gallbladder wall thickening, or biliary dilatation. Pancreas: Unremarkable. No pancreatic ductal dilatation or surrounding inflammatory changes. Spleen: Normal in size without focal abnormality. Adrenals/Urinary Tract: Stable adrenal glands. Cortical renal scarring right kidney. Nonobstructing right renal calculi. No hydronephrosis. Unremarkable bladder. Stomach/Bowel: Normal caliber large and small bowel. No bowel wall thickening to suggest ischemia. Stomach is within normal limits. Lymphatic: No lymphadenopathy. Reproductive: Uterus and bilateral adnexa are unremarkable. Other: No free intraperitoneal fluid or air. Musculoskeletal: No acute osseous abnormality. Review of the MIP images confirms the above findings. IMPRESSION: 1. 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 There is a windsock sign in the mid descending thoracic aorta compatible with intimointimal intussusception. The dissection flap extends into the abdominal aorta, right common iliac artery, right external iliac artery, and left common femoral artery terminating at the bifurcation into the superficial and profunda femoral arteries. The left internal iliac artery  is not opacified likely occluded. The celiac axis, left renal artery, and IMA arise from the true lumen. The right renal artery and probably the SMA arise from the false lumen. 2. Mild wall thickening of the aortic arch. 3. Cardiomegaly with small pericardial effusion. 4. Mildly dilated main pulmonary artery which can be seen in the setting of pulmonary hypertension. 5. Interlobular septal thickening and patchy ground-glass opacities greatest in the lower lungs concerning for pulmonary edema. Trace bilateral pleural effusions. 6. Nonobstructing right renal calculi. Critical Value/emergent results were called by telephone at the time of interpretation on 01/29/2023 at 2:39 am to provider [facility] , who verbally acknowledged these results. Electronically Signed   By: Minerva Fester M.D.   On: 01/29/2023 02:39   CT Renal Stone Study  Result Date: 01/29/2023 CLINICAL DATA:  lower back pain that started yesterday and multiple episodes of emesis today. Pt also endorses increases SOB. HX of similar pain back in September, unknown etiology. Hx kidney stones. EXAM: CT ABDOMEN AND PELVIS WITHOUT CONTRAST TECHNIQUE: Multidetector CT imaging of the abdomen and pelvis was performed following the standard protocol without IV contrast. RADIATION DOSE REDUCTION: This exam was performed according to the departmental dose-optimization program which includes automated exposure control, adjustment of the mA and/or kV according to patient size and/or use of iterative reconstruction technique. COMPARISON:  CT abdomen pelvis 10/31/2021 FINDINGS: Lower chest: Bilateral trace pleural effusions. Passive atelectasis. Cardiac findings suggestive of anemia. 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 Hepatobiliary: No focal liver abnormality. No gallstones, gallbladder wall thickening, or pericholecystic fluid. No biliary dilatation. Pancreas: No focal  lesion. Normal pancreatic contour. No surrounding inflammatory changes. No main pancreatic ductal dilatation. Spleen: Normal in size without focal abnormality. Adrenals/Urinary Tract: No adrenal nodule bilaterally. Right nephrolithiasis measuring up to 7 mm. No left nephrolithiasis. No ureterolithiasis. The urinary bladder is unremarkable. Stomach/Bowel: Stomach is within normal limits. No evidence of bowel wall thickening or dilatation. Colonic diverticulosis. Appendix appears normal. Vascular/Lymphatic: Interval development of dissection of the infrarenal abdominal aorta (2:39) that extends to at least the left common iliac artery (2:50). No abdominal aorta or iliac aneurysm. Question slight periaortic fat stranding (2:42). Mild atherosclerotic plaque of the aorta and its branches. No abdominal, pelvic, or inguinal lymphadenopathy. Reproductive: Uterus and bilateral adnexa are unremarkable. Other: No intraperitoneal free fluid. No intraperitoneal free gas. No organized fluid collection. Musculoskeletal: No abdominal wall hernia or abnormality. No suspicious lytic or blastic osseous lesions. No acute displaced fracture. Multilevel degenerative changes of the spine. IMPRESSION: 1. Findings suggestive of interval development of dissection of the infrarenal abdominal aorta likely extending to the left common iliac artery. 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 Recommend emergent CTA of the chest, abdomen, pelvis (OK to do even though Creatinine of 6 as benefits > risks). 2. Nonobstructive right nephrolithiasis measuring up to 7 mm. 3. Colonic diverticulosis with no acute diverticulitis. 4. Bilateral trace pleural effusions. These results were called by telephone at the time of interpretation on 01/29/2023 at 1:36 am to provider [facility] , who verbally acknowledged these results. Electronically Signed   By: Tish Frederickson M.D.   On: 01/29/2023 01:40     Microbiology: Results for orders placed or performed during the hospital encounter of 01/29/23  MRSA Next Gen by PCR, Nasal     Status: None   Collection Time: 01/29/23  5:39 AM   Specimen: Nasal Mucosa; Nasal Swab  Result Value Ref Range Status   MRSA by PCR Next Gen NOT DETECTED NOT DETECTED Final    Comment: (NOTE) The GeneXpert MRSA Assay (FDA approved for NASAL specimens only), is one component of a comprehensive MRSA colonization surveillance program. It is not intended to diagnose MRSA infection nor to guide or monitor treatment for MRSA infections. Test performance is not FDA approved in patients less than 91 years old. Performed at [facility], [address], [place], [place] [address]    *Note: Due to a large number of results and/or encounters for the requested time period, some results have not been displayed. A complete set of results can be found in Results Review.    Labs: CBC: Recent Labs  Lab 02/15/23 1516 02/16/23 0345 02/17/23 0515 02/18/23 0136 02/20/23 0508  WBC 13.8* 14.1* 11.9* 10.5 11.0*  HGB 8.9* 8.8* 8.4* 8.5* 8.9*  HCT 26.2* 25.4* 25.1* 25.3* 26.5*  MCV 93.2 92.0 94.4 95.5 95.7  PLT 229 209 231 239 259   Basic Metabolic Panel: Recent Labs  Lab 02/15/23 0540 02/15/23 1515 02/16/23 0345 02/17/23 0515 02/18/23 0136 02/20/23 0508  NA 132* 129* 133* 134* 134* 138  K 4.3 4.2 4.1 4.5 4.4 4.9  CL 95* 92* 94* 96* 96* 98  CO2 27 25 29 27 29 26   GLUCOSE 172* 205* 181* 91 102* 102*  BUN 71* 79* 40*  61* 35* 56*  CREATININE 6.49* 7.23* 4.39* 6.30* 4.82* 8.07*  CALCIUM 7.9* 7.8* 8.1* 8.1* 8.3* 8.3*  MG 1.8  --   --   --   --   --   PHOS 4.2 4.0  --   --   --   --    Liver Function Tests: Recent Labs  Lab 02/15/23 1515 02/16/23 0345 02/17/23 0515 02/18/23 0136 02/20/23 0508  AST  --  30 24 24 19   ALT  --  27 24 23 17   ALKPHOS  --  159* 141* 146* 121  BILITOT  --  0.7 0.5 0.7 0.2*  PROT  --  6.8 6.6 6.9 7.3  ALBUMIN 2.3* 2.2*  2.2* 2.1* 2.1*   CBG: Recent Labs  Lab 02/20/23 0655 02/20/23 1138 02/20/23 2107 02/21/23 0559 02/21/23 1141  GLUCAP 101* 102* 92 88 144*    Discharge time spent: less than 30 minutes.  Signed: Rickey Barbara, MD [facility] 02/21/2023

## 2023-02-21 NOTE — Progress Notes (Signed)
Contacted attending and nephrologist to inquire if pt will be d/c and starting at out-pt clinic tomorrow. Will await response from providers. Will provide update to clinic according to provider's response.   Olivia Canter Renal Navigator [phone]

## 2023-02-21 NOTE — Progress Notes (Addendum)
Occupational Therapy Treatment Patient Details Name: [name] MRN: [id number] DOB: [birth year] Today's Date: 02/21/2023   History of present illness 57 y.o. female admitted 4/14 with c/o back and abdominal pain with N&V. 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 4/18 thoracic endovascular aortic repair. 4/23 SBO. PMH: T2DM, CKD, HIV, sarcoidosis   OT comments  Pt progressing towards goals, completing seated and standing ADLs with min guard A , mod I for bed mobility, and supervision for transfers with RW. Pt reports 4/10 fatigue after hallway ambulation, SpO2 remaining 93% and above on RA, provided with IS and pt able to demo use. Pt presenting with impairments listed below, will follow acutely. Continue to recommend HHOT at d/c.    Recommendations for follow up therapy are one component of a multi-disciplinary discharge planning process, led by the attending physician.  Recommendations may be updated based on patient status, additional functional criteria and insurance authorization.    Assistance Recommended at Discharge Intermittent Supervision/Assistance  Patient can return home with the following  A little help with walking and/or transfers;A lot of help with bathing/dressing/bathroom;Assistance with cooking/housework;Direct supervision/assist for medications management;Direct supervision/assist for financial management;Assist for transportation;Help with stairs or ramp for entrance   Equipment Recommendations  Tub/shower bench    Recommendations for Other Services PT consult    Precautions / Restrictions Precautions Precautions: Fall Restrictions Weight Bearing Restrictions: No       Mobility Bed Mobility Overal bed mobility: Modified Independent                  Transfers Overall transfer level: Needs assistance Equipment used: Rolling walker (2 wheels) Transfers: Sit to/from Stand Sit to  Stand: Supervision                 Balance Overall balance assessment: Needs assistance Sitting-balance support: No upper extremity supported, Feet supported Sitting balance-Leahy Scale: Good Sitting balance - Comments: EOB without support   Standing balance support: Bilateral upper extremity supported, During functional activity Standing balance-Leahy Scale: Fair Standing balance comment: able to maintain static and dynamic standing balance with supervision                           ADL either performed or assessed with clinical judgement   ADL Overall ADL's : Needs assistance/impaired     Grooming: Oral care;Set up;Standing Grooming Details (indicate cue type and reason): stands at sink to brush teeth         Upper Body Dressing : Min guard;Standing Upper Body Dressing Details (indicate cue type and reason): donned clean gown     Toilet Transfer: Min guard;Ambulation;Regular Toilet;Rolling walker (2 wheels) Toilet Transfer Details (indicate cue type and reason): simulated via functional mobility         Functional mobility during ADLs: Min guard;Rolling walker (2 wheels)      Extremity/Trunk Assessment Upper Extremity Assessment Upper Extremity Assessment: Overall WFL for tasks assessed   Lower Extremity Assessment Lower Extremity Assessment: Defer to PT evaluation        Vision   Vision Assessment?: No apparent visual deficits   Perception Perception Perception: Not tested   Praxis Praxis Praxis: Not tested    Cognition Arousal/Alertness: Awake/alert Behavior During Therapy: WFL for tasks assessed/performed Overall Cognitive Status: Within Functional Limits for tasks assessed  General Comments: family present at bedside        Exercises      Shoulder Instructions       General Comments VSS on RA    Pertinent Vitals/ Pain       Pain Assessment Pain Assessment: No/denies  pain  Home Living                                          Prior Functioning/Environment              Frequency  Min 1X/week        Progress Toward Goals  OT Goals(current goals can now be found in the care plan section)  Progress towards OT goals: Progressing toward goals  Acute Rehab OT Goals Patient Stated Goal: none stated OT Goal Formulation: With patient Time For Goal Achievement: 03/07/23 Potential to Achieve Goals: Good ADL Goals Pt Will Perform Upper Body Dressing: Independently;sitting Pt Will Perform Lower Body Dressing: Independently;sitting/lateral leans;sit to/from stand Pt Will Transfer to Toilet: Independently;ambulating;regular height toilet Pt Will Perform Tub/Shower Transfer: Tub transfer;Shower transfer;ambulating Additional ADL Goal #1: pt will verbalize x3 energy conservation strategies in prep for ADLs  Plan Discharge plan remains appropriate;Frequency remains appropriate    Co-evaluation                 AM-PAC OT "6 Clicks" Daily Activity     Outcome Measure   Help from another person eating meals?: None Help from another person taking care of personal grooming?: A Little Help from another person toileting, which includes using toliet, bedpan, or urinal?: A Little Help from another person bathing (including washing, rinsing, drying)?: A Little Help from another person to put on and taking off regular upper body clothing?: A Little Help from another person to put on and taking off regular lower body clothing?: A Little 6 Click Score: 19    End of Session Equipment Utilized During Treatment: Rolling walker (2 wheels)  OT Visit Diagnosis: Unsteadiness on feet (R26.81);Other abnormalities of gait and mobility (R26.89);Muscle weakness (generalized) (M62.81)   Activity Tolerance Patient tolerated treatment well   Patient Left in bed;with call bell/phone within reach;with family/visitor present   Nurse Communication  Mobility status        Time: 1026-1050 OT Time Calculation (min): 24 min  Charges: OT General Charges $OT Visit: 1 Visit OT Treatments $Self Care/Home Management : 8-22 mins $Therapeutic Activity: 8-22 mins  [name], OTD, OTR/L SecureChat Preferred Acute Rehab [phone]   [name] Koonce 02/21/2023, 12:23 PM

## 2023-02-21 NOTE — Plan of Care (Signed)
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  Allergies:  Allergies  Allergen Reactions   Genvoya [Elviteg-Cobic-Emtricit-Tenofaf] Hives   Lisinopril Cough   Tape Rash    Rash  Okay with tegaderm and paper tape   Aldactone [Spironolactone] Hives   Tegaderm Ag Mesh [Silver] Itching    Date of First Dialysis: 01/31/23 Cause of renal disease: HTN  Dialysis Prescription: Dialysis Frequency: TIW Tx duration: 4 hours BFR: 400 DFR: 800 EDW: 91.5kg  Dialyzer: 180NRe UF profile/Sodium modeling?: No Dialysis Bath: 2 K 2 Ca  Dialysis access: Access type: R IJ TDC Date placed: 01/30/23 Surgeon: [facility] IR Needle gauge: N/A  In Center Medications: Heparin Dose: None  Type: N/A VDRA: None Venofer: none Mircera: Mircera IV q 2 weeks Next dose due: 02/22/23 Sensipar: None  Discharge labs: Hgb: 8.9 K+: 4.9  Ca: 8.3  Phos: 4.0 Alb: 2.1  Please draw routine labs. Additional labs needed: None Additional notes/follow-up: Will need referral to [facility] for access planning  Rogers Blocker, PA-C 02/21/2023, 3:57 PM  [facility] Pager: [phone]

## 2023-02-21 NOTE — TOC Transition Note (Signed)
Transition of Care (TOC) - CM/SW Discharge Note Donn Pierini RN,BSN Transitions of Care Unit 4NP (Non Trauma)- RN Case Manager See Treatment Team for direct Phone #   Patient Details  Name: [name] MRN: [id number] Date of Birth: 1965-12-17  Transition of Care [facility]) CM/SW Contact:  Darrold Span, RN Phone Number: 02/21/2023, 4:06 PM   Clinical Narrative:    Pt stable for transition home today, orders placed for HHPT/OT/aide per therapy recommendations- also DME- RW, BSC, sh. chair  CM spoke with pt at bedside- sister and daughter also present with regards to transition needs and plan. Discussed HH choice and provided list for choice Per CMS guidelines from [employer]Financing[employer]pl website with star ratings (copy placed in shadow chart)- per pt and family they would like to see if [facility], or [facility] can accept referral. If not then they do not have a preference past those and are agreeable to CM securing on pt's behalf.    Also discussed DME needs- pt voiced she does not feel she needs BSC, request RW for home, and wants to see if insurance will cover shower chair- if not the family will look for one on their own. Pt does not have  a preference for DME provider.    Call made to [facility]- to see if they are in-network with insurance and able to accept referral- referral pending for [facility] to check insurance.  Update- post discharge- 5/8- 1300- received call from [facility]- pt is in-network and [name] can accept referral for HHPT needs- [name] will reach out to pt to schedule start of care.   Call made to [facility] w/ [employer]- shower chair is not covered- out of pocket cost $64- average cost on [employer]- info shared with pt/family- they voiced they will look for sh. Chair on their own- will only get RW for discharge- [employer] to deliver RW to room prior to discharge.   TOC to f/u on [facility]-  Post discharge- 5/8- referral accepted by [facility]   Final next  level of care: Home w Home Health Services Barriers to Discharge: No Barriers Identified   Patient Goals and CMS Choice CMS Medicare.gov Compare Post Acute Care list provided to:: Patient Choice offered to / list presented to : Patient, Sibling  Discharge Placement                         Discharge Plan and Services Additional resources added to the After Visit Summary for     Discharge Planning Services: CM Consult Post Acute Care Choice: Home Health, Durable Medical Equipment          DME Arranged: Dan Humphreys rolling DME Agency: [employer] Date DME Agency Contacted: 02/21/23 Time DME Agency Contacted: 1515 Representative spoke with at DME Agency: Vaughan Basta HH Arranged: PT, OT HH Agency: [facility] Date [facility] Agency Contacted: 02/21/23 Time HH Agency Contacted: 1515 Representative spoke with at [facility] Agency: Kandee Keen  Social Determinants of Health (SDOH) Interventions SDOH Screenings   Food Insecurity: No Food Insecurity (02/03/2023)  Housing: Low Risk  (02/03/2023)  Transportation Needs: No Transportation Needs (02/03/2023)  Utilities: Not At Risk (02/03/2023)  Depression (PHQ2-9): Low Risk  (06/09/2022)  Tobacco Use: Medium Risk (02/04/2023)     Readmission Risk Interventions    02/21/2023    4:05 PM  Readmission Risk Prevention Plan  Transportation Screening Complete  Medication Review (RN Care Manager) Complete  PCP or Specialist appointment within 3-5  days of discharge Complete  HRI or Home Care Consult Complete  SW Recovery Care/Counseling Consult Complete  Palliative Care Screening Not Applicable  Skilled Nursing Facility Not Applicable

## 2023-02-22 ENCOUNTER — Telehealth: Payer: Self-pay

## 2023-02-22 NOTE — Telephone Encounter (Signed)
LVM for patient to schedule HTN Clinic f/u. Patient has been called 4 times.

## 2023-02-22 NOTE — Transitions of Care (Post Inpatient/ED Visit) (Signed)
   02/22/2023  Name: [name] MRN: [id number] DOB: 1965/12/25  Today's TOC FU Call Status: Today's TOC FU Call Status:: Unsuccessul Call (1st Attempt) Unsuccessful Call (1st Attempt) Date: 02/22/23  Attempted to reach the patient regarding the most recent Inpatient/ED visit.  Follow Up Plan: Additional outreach attempts will be made to reach the patient to complete the Transitions of Care (Post Inpatient/ED visit) call.   Signature   Chieko Neises, CMA  [facility] AWV Team

## 2023-02-23 ENCOUNTER — Other Ambulatory Visit: Payer: Self-pay

## 2023-02-23 ENCOUNTER — Telehealth: Payer: Self-pay

## 2023-02-23 DIAGNOSIS — I7103 Dissection of thoracoabdominal aorta: Secondary | ICD-10-CM

## 2023-02-23 NOTE — Transitions of Care (Post Inpatient/ED Visit) (Signed)
   02/23/2023  Name: [name] MRN: [id number] DOB: [birth year]  Today's TOC FU Call Status: Today's TOC FU Call Status:: Unsuccessful Call (2nd Attempt) Unsuccessful Call (1st Attempt) Date: 02/22/23 Unsuccessful Call (2nd Attempt) Date: 02/23/23  Attempted to reach the patient regarding the most recent Inpatient/ED visit.  Follow Up Plan: Additional outreach attempts will be made to reach the patient to complete the Transitions of Care (Post Inpatient/ED visit) call.   Signature Elisha Ponder LPN [facility] AWV Team Direct dial:  [phone]

## 2023-03-01 ENCOUNTER — Other Ambulatory Visit: Payer: Self-pay | Admitting: Hematology and Oncology

## 2023-03-03 ENCOUNTER — Telehealth: Payer: Self-pay | Admitting: Primary Care

## 2023-03-03 NOTE — Transitions of Care (Post Inpatient/ED Visit) (Signed)
Pt called in for [facility] ED FU; pt had not felt like returning calls until today. Pt said was d/c from [facility] on 02/21/23 and [facility] started seeing pt on 02/23/23. Pt cannot remember names of meds when sent home from hospital but pt said she is taking as directed. Pt going to dialysis M-W-F. Pt already has appt with specialist and pt scheduled appt with Allayne Gitelman NP on 03/09/23 at 9:40 with UC & ED precautions and pt voiced understanding. Sending note to Allayne Gitelman NP.       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 Reason for ED Visit: Cardiac Conditions (aorta tear) How have you been since you were released from the hospital?: Better Any questions or concerns?: No  Items Reviewed: Did you receive and understand the discharge instructions provided?: Yes Medications obtained,verified, and reconciled?: Yes (Medications Reviewed) (pt started on meds given while in hospital and pt not sure of names of meds but has been taking as instructed.) Any new allergies since your discharge?: No Dietary orders reviewed?: NA Do you have support at home?: Yes People in Home: child(ren), adult Name of Support/Comfort Primary Source: Deandra  Medications Reviewed Today: Medications Reviewed Today     Reviewed by Saundra Shelling, RN (Registered Nurse) on 02/02/23 at 1511  Med List Status: Complete   Medication Order Taking? Sig Documenting Provider Last Dose Status Informant  acetaminophen (TYLENOL) 500 MG tablet  [id number] Yes Take 1,000 mg by mouth every 6 (six) hours as needed for mild pain. [provider] 01/28/2023 2030 Active Self  albuterol (VENTOLIN HFA) 108 (90 Base) MCG/ACT inhaler [id number] Yes Inhale 2 puffs into the lungs every 6 (six) hours as needed for wheezing or shortness of breath. Hall-Potvin, [place], [place] 01/28/2023 Active Self  allopurinol (ZYLOPRIM) 100 MG tablet [id number] Yes TAKE 1 TABLET BY MOUTH EVERY DAY FOR GOUT PREVENTION  Patient taking differently: Take 100 mg by mouth daily. For gout prevention   Doreene Nest, NP 01/27/2023 Active Self  Blood Glucose Monitoring Suppl (FREESTYLE LITE) w/Device KIT [id number]  Inject 1 each into the skin daily. Chilton Si, MD  Active Self  candesartan (ATACAND) 32 MG tablet [id number] Yes TAKE 1 TABLET BY MOUTH DAILY. Chilton Si, MD 01/28/2023 Active Self  carvedilol (COREG) 25 MG tablet [id number] Yes Take 1 tablet (25 mg total) by mouth 2 (two) times daily. Rosalee Kaufman, RPH-CPP 01/28/2023 1430 Active Self           Med Note (WHITE, Elvin So   Sun Jan 29, 2023  4:16 AM) Rochele Pages only one dose yesterday  cetirizine (ZYRTEC) 10 MG tablet [id number] Yes TAKE 1 TABLET BY MOUTH AT BEDTIME FOR ALLERGIES  Patient taking differently: Take 10 mg by mouth at bedtime.   Doreene Nest, NP 01/27/2023 pm Active Self  dolutegravir ([name]) 50 MG tablet [id number] Yes TAKE 1 TABLET BY MOUTH EVERY DAY Cliffton Asters, MD 01/27/2023 Active Self  emtricitabine-tenofovir AF (DESCOVY) 200-25 MG tablet [id number] Yes TAKE 1 TABLET BY MOUTH EVERY DAY Cliffton Asters, MD 01/27/2023 Active Self  FLOVENT HFA 110  MCG/ACT inhaler [id number] Yes TAKE 1 PUFF BY MOUTH TWICE A DAY  Patient taking differently: Inhale 1 puff into the lungs 2 (two) times daily as needed (wheezing/shortness of breath).   Doreene Nest, NP Past Week Active Self  fluticasone (FLONASE) 50 MCG/ACT nasal spray [id number] Yes Place 2 sprays into both nostrils daily. Eden Emms, NP 01/27/2023 pm Active Self  gabapentin (NEURONTIN) 300 MG capsule [id number] Yes Take 300 mg by mouth daily. [provider] 01/27/2023 Active Self  glipiZIDE (GLUCOTROL) 10 MG tablet [id number] Yes TAKE 1 TABLET BY MOUTH TWICE A DAY FOR DIABETES  Patient taking differently: Take 10 mg by mouth 2 (two) times daily before a meal.   Doreene Nest, NP 01/27/2023 pm Active Self  glucose blood (ONETOUCH VERIO) test strip [id number]  USE AS INSTRUCTED TO TEST BLOOD SUGAR up to 3 times daily. Doreene Nest, NP  Active Self  hydrALAZINE (APRESOLINE) 50 MG tablet [id number] Yes TAKE 1 TABLET (50 MG) BY MOUTH IN THE MORNING AND AT BEDTIME  Patient taking differently: Take 50 mg by mouth in the morning and at bedtime.   Chilton Si, MD 01/28/2023 am Active Self  Lancets [facility] ULTRASOFT) lancets [id number]  Use as instructed to test blood sugar daily Doreene Nest, NP  Active Self  levothyroxine (SYNTHROID) 175 MCG tablet [id number] Yes TAKE 1 TABLET BY MOUTH EVERY DAY Carlus Pavlov, MD 01/28/2023 am Active Self  minocycline (DYNACIN) 100 MG tablet [id number] Yes Take 1 tablet (100 mg total) by mouth 2 (two) times daily. Cliffton Asters, MD 01/27/2023 pm Active Self           Med Note (WHITE, [place] Jan 29, 2023  4:20 AM) Continuous therapy for Hidradenitis suppurativa  omeprazole (PRILOSEC) 40 MG capsule [id number] Yes Take 1 capsule (40 mg total) by mouth 2 (two) times daily before a meal. Doree Albee, PA-C 01/28/2023 am Active Self  rosuvastatin (CRESTOR) 10 MG tablet [id number] Yes TAKE 1 TABLET BY MOUTH EVERY DAY FOR CHOLESTEROL  Patient taking differently: Take 10 mg by mouth daily. FOR CHOLESTEROL   Doreene Nest, NP 01/27/2023 pm Active Self  sertraline (ZOLOFT) 50 MG tablet [id number] Yes TAKE 1 TABLET (50 MG TOTAL) BY MOUTH DAILY. FOR ANXIETY AND DEPRESSION. Doreene Nest, NP 01/27/2023 am Active Self  tamoxifen (NOLVADEX) 20 MG tablet [id number]  Yes Take 1 tablet (20 mg total) by mouth daily. Magrinat, Valentino Hue, MD 01/27/2023 am Active Self            Home Care and Equipment/Supplies: Were Home Health Services Ordered?: Yes Name of Home Health Agency:: [facility] Has Agency set up a time to come to your home?: Yes First Home Health Visit Date: 02/23/23 Any new equipment or medical supplies ordered?: No  Functional Questionnaire: Do you need assistance with bathing/showering or dressing?: No Do you need assistance with meal preparation?: No Do you need assistance with eating?: No Do you have difficulty maintaining continence: No Do you need assistance with getting out of bed/getting out of a chair/moving?: No Do you have difficulty managing or taking your medications?: No  Follow up appointments reviewed: PCP Follow-up appointment confirmed?: Yes Date of PCP follow-up appointment?: 03/09/23 Follow-up Provider: Mayra Reel NP Specialist Hospital Follow-up appointment confirmed?: Yes Date of Specialist follow-up appointment?:  (pt said she has appt with specialist already set up; no other info given; pt was getting tired.) Do you need transportation to your follow-up appointment?: No  Do you understand care options if your condition(s) worsen?: Yes-patient verbalized understanding    SIGNATURE Lewanda Rife, LPN

## 2023-03-03 NOTE — Telephone Encounter (Signed)
Noted  

## 2023-03-03 NOTE — Telephone Encounter (Signed)
Approved.  

## 2023-03-03 NOTE — Telephone Encounter (Signed)
Home Health verbal orders Caller Name: Roe Coombs Agency Name: [facility] number: [phone]  Requesting OT  Reason: Evaluation for OT next week due to patient having dialysis today.   Please forward to [facility] pool or providers CMA

## 2023-03-06 NOTE — Telephone Encounter (Signed)
Called and advised Roe Coombs with [name]  of the approval of the requested verbal orders for this patient. Advised to call back with any further questions.

## 2023-03-07 ENCOUNTER — Other Ambulatory Visit: Payer: Self-pay | Admitting: Primary Care

## 2023-03-07 ENCOUNTER — Encounter: Payer: Self-pay | Admitting: Primary Care

## 2023-03-07 ENCOUNTER — Ambulatory Visit (INDEPENDENT_AMBULATORY_CARE_PROVIDER_SITE_OTHER): Payer: Commercial Managed Care - HMO | Admitting: Primary Care

## 2023-03-07 VITALS — BP 150/82 | HR 72 | Temp 96.8°F | Ht 65.0 in | Wt 190.0 lb

## 2023-03-07 DIAGNOSIS — Z9189 Other specified personal risk factors, not elsewhere classified: Secondary | ICD-10-CM

## 2023-03-07 DIAGNOSIS — T451X5A Adverse effect of antineoplastic and immunosuppressive drugs, initial encounter: Secondary | ICD-10-CM

## 2023-03-07 DIAGNOSIS — E440 Moderate protein-calorie malnutrition: Secondary | ICD-10-CM

## 2023-03-07 DIAGNOSIS — Z7984 Long term (current) use of oral hypoglycemic drugs: Secondary | ICD-10-CM

## 2023-03-07 DIAGNOSIS — I7103 Dissection of thoracoabdominal aorta: Secondary | ICD-10-CM

## 2023-03-07 DIAGNOSIS — B20 Human immunodeficiency virus [HIV] disease: Secondary | ICD-10-CM

## 2023-03-07 DIAGNOSIS — G620 Drug-induced polyneuropathy: Secondary | ICD-10-CM

## 2023-03-07 DIAGNOSIS — N186 End stage renal disease: Secondary | ICD-10-CM

## 2023-03-07 DIAGNOSIS — E785 Hyperlipidemia, unspecified: Secondary | ICD-10-CM

## 2023-03-07 DIAGNOSIS — K219 Gastro-esophageal reflux disease without esophagitis: Secondary | ICD-10-CM | POA: Diagnosis not present

## 2023-03-07 DIAGNOSIS — E1169 Type 2 diabetes mellitus with other specified complication: Secondary | ICD-10-CM

## 2023-03-07 DIAGNOSIS — I10 Essential (primary) hypertension: Secondary | ICD-10-CM

## 2023-03-07 DIAGNOSIS — M546 Pain in thoracic spine: Secondary | ICD-10-CM

## 2023-03-07 DIAGNOSIS — E039 Hypothyroidism, unspecified: Secondary | ICD-10-CM

## 2023-03-07 DIAGNOSIS — M1A9XX0 Chronic gout, unspecified, without tophus (tophi): Secondary | ICD-10-CM

## 2023-03-07 MED ORDER — SITAGLIPTIN PHOSPHATE 50 MG PO TABS
50.0000 mg | ORAL_TABLET | Freq: Every day | ORAL | 0 refills | Status: DC
Start: 2023-03-07 — End: 2023-03-07

## 2023-03-07 NOTE — Assessment & Plan Note (Signed)
Controlled.  Continue omeprazole 40 mg twice daily. 

## 2023-03-07 NOTE — Assessment & Plan Note (Signed)
Continue hemodialysis on Monday, Wednesday, Friday. Following with nephrology.  Continue to follow with nutritionist.

## 2023-03-07 NOTE — Assessment & Plan Note (Signed)
Continue rosuvastatin 10 mg daily.

## 2023-03-07 NOTE — Assessment & Plan Note (Signed)
Continue levothyroxine 175 mcg tablets daily. Following with endocrinology, TSH reviewed from November 2023.

## 2023-03-07 NOTE — Assessment & Plan Note (Signed)
Uncontrolled, however she is due for dialysis tomorrow.  Given drops in blood pressure during dialysis we will leave medication as is for now.  I will defer any medication changes to nephrology.  Continue amlodipine 5 mg daily, carvedilol 25 mg twice daily, losartan 50 mg daily.

## 2023-03-07 NOTE — Addendum Note (Signed)
Addended by: Doreene Nest on: 03/07/2023 04:16 PM   Modules accepted: Orders

## 2023-03-07 NOTE — Assessment & Plan Note (Signed)
Well-controlled.  Start Januvia 50 mg daily to replace her prior glipizide prescription. She will notify if insurance will not cover.  Follow-up in 6 months.

## 2023-03-07 NOTE — Assessment & Plan Note (Signed)
No longer on gabapentin.  Consider resuming for back pain if needed.  She will update.

## 2023-03-07 NOTE — Assessment & Plan Note (Signed)
No noted documentation regarding her episode of apnea. Regardless, she is at high risk.  Referral placed to pulmonology for sleep study.

## 2023-03-07 NOTE — Assessment & Plan Note (Signed)
Following with infectious disease.  Continue Descovy 200-25 mg daily, [name] 50 mg daily.

## 2023-03-07 NOTE — Assessment & Plan Note (Signed)
Continue allopurinol 100 mg daily. 

## 2023-03-07 NOTE — Patient Instructions (Signed)
You will either be contacted via phone regarding your referral to pulmonology for the sleep study, or you may receive a letter on your MyChart portal from our referral team with instructions for scheduling an appointment. Please let us know if you have not been contacted by anyone within two weeks.  Follow-up closely with a nutritionist.  Please call Dr. Karin Lieu office regarding your back pain.  Please schedule a follow up visit for 6 months for a diabetes check.  It was a pleasure to see you today!

## 2023-03-07 NOTE — Assessment & Plan Note (Signed)
Unclear if this is an expected or unexpected finding since her TEVAR. She will contact her surgeon's office today.  If back pain is presumed musculoskeletal in nature we can consider adding gabapentin. Discussed to use tramadol sparingly as needed. Continue Tylenol and cyclobenzaprine.

## 2023-03-07 NOTE — Assessment & Plan Note (Signed)
Stable.  Continue to work with nutritionist.

## 2023-03-07 NOTE — Progress Notes (Addendum)
Subjective:    Patient ID: [name], female    DOB: January 16, 1966, 57 y.o.   MRN: [id number]  HPI  [name] is a very pleasant 57 y.o. female with a significant medical history including HIV, thyroid cancer, post surgical hypothyroidism, type 2 diabetes, dissecting abdominal aortic aneurysm, breast cancer, CKD, chroinc gout, renal stones, hypertension, sarcoidosis, who presents today for hospital follow up.  Her daughter joins [place] today.  She presented to [facility] ED on 01/29/23 for a 1 day history of lower abdominal pain with radiation to her back, shortness of breath, and vomiting. During her stay in the ED she underwent CT renal stone study which was without obstructive renal stones but did show increased descending thoracic and hiatal aorta aneurysm. She underwent CTA chest/abdomen/pelvis which confirmed type B dissection. She was hypertensive so she was treated with nitroglycerin and esmolol IV, and transferred to [name] for admission.   She was admitted to ICU. She was noted to have AKI with CKD now stage 5. Nephrology became involved who recommended hemodialysis catheter placement given uremic symptoms of CKD. Catheter was placed on 04/15. She underwent TEVAR on 04/18 for complicated type B dissection. She then had a SBO on 04/20 and was treated by NG tube. She also required TPN feedings. TPN feedings were discontinued on 04/26. Her carvedilol was titrated up to 25 mg twice daily for blood pressure control.  Initially managed on hydralazine which was changed to amlodipine 5 mg.  NG tube was removed on 5/2.  She was discharged home on 02/21/2023 with recommendations for continued hemodialysis on Monday, Wednesday, Friday; PCP follow-up, vascular services follow-up.  Her glipizide 10 mg twice daily was discontinued and Januvia 25 mg was initiated.  Since her discharge home she is feeling better, but she is still recovering.  Her appetite remains poor because  nothing sounds or taste good.  She is working with a nutritionist regarding the best type of food for her medical history.  She is completing dialysis on M,W,F. She is planned to undergo fistula placement in the near future, mapping appointment is in 2 weeks with Dr. Sherral Hammers.   She is checking BP at home which is running 150's-180's/50's.  Her blood pressure has dropped notably during hemodialysis.  She is compliant to amlodipine 5 mg daily, carvedilol 25 mg twice daily, losartan 50 mg daily.  She is on a 40 ounce restricted liquids and isn't drinking more than 20 ounces per day.   She discusses today that the hospitalitis were trying to get Januvia approved through her insurance prior to discharge but were unable to do so.  She does fear that her glucose levels will increase when she starts eating more.  Her A1c on 02/08/2023 was 6.0.  She continues to experience upper back and bilateral shoulder pain.  She is not sure if this is related to her recent surgery or from laying in the hospital bed.  She is mostly taking Tylenol, but will take tramadol 50 mg 4-6 hours and cyclobenzaprine several times daily when needed.  She has not notified her surgeon of her back pain.  She was told in the hospital that she needs a sleep study.  She was referred previously but was unable to follow through.  She is scheduled to have labs drawn next week per nephrology.  BP Readings from Last 3 Encounters:  03/07/23 (!) 150/82  02/21/23 137/62  12/19/22 (!) 192/90   Wt Readings from Last 3 Encounters:  03/07/23 190 lb (86.2 kg)  02/21/23 201 lb 15.1 oz (91.6 kg)  12/19/22 211 lb 8 oz (95.9 kg)      Review of Systems  Constitutional:  Positive for appetite change and fatigue.  Respiratory:  Negative for shortness of breath.   Cardiovascular:  Negative for chest pain.  Musculoskeletal:  Positive for arthralgias and back pain.  Neurological:  Negative for numbness.         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  Genes Analyzed (43 total): APC*, ATM*, AXIN2, BARD1, BMPR1A, BRCA1*, BRCA2*, BRIP1*, CDH1*, CDK4, CDKN2A, CHEK2*, DICER1, GALNT12, HOXB13, MEN1, MLH1*, MRE11A, MSH2*, MSH3, MSH6*, MUTYH*, NBN, NF1*, NTHL1, PALB2*, PMS2*, POLD1, POLE, PTEN*, RAD50, RAD51C*, RAD51D*, RET, SDHB, SDHD, SMAD4, SMARCA4, STK11 and TP53* (sequencing and deletion/duplication); EGFR (s   GERD 07/24/2006   GRAVE'S DISEASE 01/01/2008   History of hidradenitis suppurativa    History of kidney stones    History of thrush    HIV DISEASE 07/24/2006   dx March 05   Hyperlipidemia    HYPERTENSION 07/24/2006   Hyperthyroidism 08/2006   Grave's Disease -diffuse radiotracer uptake 08/25/06 Thyroid scan-Cold nodule to R lower lobe of thyrorid   Ileus (HCC) 02/14/2023   Menometrorrhagia    hx of   Nephrolithiasis    Panniculitis 05/12/2020   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HCC)    METASTATIC PAPILLARY THYROID CARCINOMA per 01/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   Personal history of chemotherapy    2020   Personal  history of radiation therapy    2020   Pneumonia 2005   Port-A-Cath in place 07/12/2018   Postsurgical hypothyroidism 03/20/2011   Rash 02/16/2012   Renal calculi 10/29/2014   Sarcoidosis 02/08/2007   dx as a teenager in [place] from abnl CXR. Completed 2 yrs Prednisone after lung bx confirmation. No symptoms since then.   Sebaceous cyst 04/21/2020   Suppurative hidradenitis    Thyroid cancer (HCC)    THYROID NODULE, RIGHT 02/08/2007    Social History   Socioeconomic History   Marital status: Single    Spouse name: Not on file   Number of children: 1   Years of education: Not on file   Highest education level: Not on file  Occupational History   Occupation: Investment banker, corporate  Tobacco Use   Smoking status: Former    Packs/day: 0.50    Years: 15.00    Additional pack years: 0.00    Total pack years: 7.50    Types: Cigarettes    Start date: 04/12/2017    Quit date: 2019  Years since quitting: 5.3   Smokeless tobacco: Never  Vaping Use   Vaping Use: Never used  Substance and Sexual Activity   Alcohol use: Yes    Alcohol/week: 0.0 standard drinks of alcohol    Comment: social   Drug use: No   Sexual activity: Not Currently    Birth control/protection: Post-menopausal    Comment: declined condoms  Other Topics Concern   Not on file  Social History Narrative   Right Handed    Lives in a two story home   Social Determinants of Health   Financial Resource Strain: Not on file  Food Insecurity: No Food Insecurity (02/03/2023)   Hunger Vital Sign    Worried About Running Out of Food in the Last Year: Never true    Ran Out of Food in the Last Year: Never true  Transportation Needs: No Transportation Needs (02/03/2023)   PRAPARE - Administrator, Civil Service (Medical): No    Lack of Transportation (Non-Medical): No  Physical Activity: Not on file  Stress: Not on file  Social Connections: Not on file  Intimate Partner Violence: Not At Risk (02/03/2023)    Humiliation, Afraid, Rape, and Kick questionnaire    Fear of Current or Ex-Partner: No    Emotionally Abused: No    Physically Abused: No    Sexually Abused: No    Past Surgical History:  Procedure Laterality Date   APPLICATION OF WOUND VAC N/A 01/20/2021   Procedure: APPLICATION OF WOUND VAC;  Surgeon: Peggye Form, DO;  Location: [facility];  Service: Plastics;  Laterality: N/A;   BREAST EXCISIONAL BIOPSY Right 04/26/2018   right axilla negative   BREAST EXCISIONAL BIOPSY Left 04/26/2018   left axilla negative   BREAST LUMPECTOMY Right 10/03/2018   malignant   BREAST LUMPECTOMY WITH RADIOACTIVE SEED AND SENTINEL LYMPH NODE BIOPSY Right 10/03/2018   Procedure: RIGHT BREAST LUMPECTOMY WITH RADIOACTIVE SEED AND SENTINEL LYMPH NODE MAPPING;  Surgeon: Harriette Bouillon, MD;  Location: [facility];  Service: General;  Laterality: Right;   BREAST SURGERY  1997   Breast Reduction    CYSTOSCOPY W/ URETERAL STENT REMOVAL  11/09/2012   Procedure: CYSTOSCOPY WITH STENT REMOVAL;  Surgeon: Sebastian Ache, MD;  Location: [facility];  Service: Urology;  Laterality: Right;   CYSTOSCOPY WITH RETROGRADE PYELOGRAM, URETEROSCOPY AND STENT PLACEMENT  11/09/2012   Procedure: CYSTOSCOPY WITH RETROGRADE PYELOGRAM, URETEROSCOPY AND STENT PLACEMENT;  Surgeon: Sebastian Ache, MD;  Location: [facility];  Service: Urology;  Laterality: Left;  LEFT URETEROSCOPY, STONE MANIPULATION, left STENT exchange    CYSTOSCOPY WITH STENT PLACEMENT  10/02/2012   Procedure: CYSTOSCOPY WITH STENT PLACEMENT;  Surgeon: Sebastian Ache, MD;  Location: [facility];  Service: Urology;  Laterality: Left;   DEBRIDEMENT AND CLOSURE WOUND N/A 01/20/2021   Procedure: Excision of abdominal wound with closure;  Surgeon: Peggye Form, DO;  Location:  SURGERY CENTER;  Service: Plastics;  Laterality: N/A;   DILATION AND CURETTAGE OF UTERUS  11/2002   s/p for 1st trimester nonviable pregnancy   EYE SURGERY     sty under  eyelid   INCISE AND DRAIN ABCESS  08/2002   s/p I &D for righ inframmary fold hidradenitis   INCISION AND DRAINAGE PERITONSILLAR ABSCESS  12/2001   IR CV LINE INJECTION  06/07/2018   IR FLUORO GUIDE CV LINE RIGHT  01/30/2023   IR IMAGING GUIDED PORT INSERTION  06/20/2018   IR REMOVAL TUN ACCESS W/ PORT W/O FL  MOD SED  06/20/2018   IR US GUIDE VASC ACCESS RIGHT  01/30/2023   IRRIGATION AND DEBRIDEMENT ABSCESS  01/31/2012   Procedure: IRRIGATION AND DEBRIDEMENT ABSCESS;  Surgeon: Kandis Cocking, MD;  Location: [facility];  Service: General;  Laterality: Right;  right breast and axilla    NEPHROLITHOTOMY  10/02/2012   Procedure: NEPHROLITHOTOMY PERCUTANEOUS;  Surgeon: Sebastian Ache, MD;  Location: [facility];  Service: Urology;  Laterality: Right;  First Stage Percutaneous Nephrolithotomy with Surgeon Access, Left Ureteral Stent     NEPHROLITHOTOMY  10/04/2012   Procedure: NEPHROLITHOTOMY PERCUTANEOUS SECOND LOOK;  Surgeon: Sebastian Ache, MD;  Location: [facility];  Service: Urology;  Laterality: Right;      NEPHROLITHOTOMY  10/08/2012   Procedure: NEPHROLITHOTOMY PERCUTANEOUS;  Surgeon: Sebastian Ache, MD;  Location: [facility];  Service: Urology;  Laterality: Right;  THIRD STAGE, nephrostomy tube exchange x 2   NEPHROLITHOTOMY  10/11/2012   Procedure: NEPHROLITHOTOMY PERCUTANEOUS SECOND LOOK;  Surgeon: Sebastian Ache, MD;  Location: [facility];  Service: Urology;  Laterality: Right;  RIGHT 4 STAGE PERCUTANOUS NEPHROLITHOTOMY, right URETEROSCOPY WITH HOLMIUM LASER    PANNICULECTOMY N/A 12/21/2020   Procedure: PANNICULECTOMY;  Surgeon: Peggye Form, DO;  Location: [facility];  Service: Plastics;  Laterality: N/A;   PERCUTANEOUS NEPHROSTOLITHOTOMY  04/2022   PORT-A-CATH REMOVAL N/A 07/16/2020   Procedure: REMOVAL PORT-A-CATH;  Surgeon: Harriette Bouillon, MD;  Location: [facility];  Service: General;  Laterality: N/A;   PORTACATH PLACEMENT Left 05/17/2018   Procedure: INSERTION PORT-A-CATH;   Surgeon: Abigail Miyamoto, MD;  Location: [facility];  Service: General;  Laterality: Left;   RADICAL NECK DISSECTION  04/12/2017   limited/notes 04/12/2017   RADICAL NECK DISSECTION N/A 04/12/2017   Procedure: RADICAL NECK DISSECTION;  Surgeon: Christia Reading, MD;  Location: [facility] OR;  Service: ENT;  Laterality: N/A;  limited neck dissection 2 hours total   REDUCTION MAMMAPLASTY Bilateral 1998   RIGHT/LEFT HEART CATH AND CORONARY ANGIOGRAPHY N/A 03/12/2020   Procedure: RIGHT/LEFT HEART CATH AND CORONARY ANGIOGRAPHY;  Surgeon: Dolores Patty, MD;  Location: [facility];  Service: Cardiovascular;  Laterality: N/A;   [place]  1994   THORACIC AORTIC ENDOVASCULAR STENT GRAFT N/A 02/02/2023   Procedure: THORACIC AORTIC ENDOVASCULAR STENT GRAFT;  Surgeon: Victorino Sparrow, MD;  Location: [facility] OR;  Service: Vascular;  Laterality: N/A;   THYROIDECTOMY  04/12/2017   completion/notes 04/12/2017   THYROIDECTOMY N/A 04/12/2017   Procedure: THYROIDECTOMY;  Surgeon: Christia Reading, MD;  Location: [facility] OR;  Service: ENT;  Laterality: N/A;  Completion Thyroidectomy   TOTAL THYROIDECTOMY  2010    Family History  Problem Relation Age of Onset   Hypertension Mother    Cancer Mother        laryngeal   Heart disease Mother        stent   Pancreatic cancer Father    Hypertension Father    Lung cancer Father 62       hx smoking   Hypertension Sister    Hypertension Sister    Hypertension Brother    Breast cancer Maternal Aunt 25   Breast cancer Maternal Aunt        dx 60+   Cancer Maternal Uncle        Lung CA   Breast cancer Paternal Aunt 57   Breast cancer Paternal Aunt        dx 46's   Breast cancer Paternal Aunt  dx 50's   Prostate cancer Paternal Uncle    Prostate cancer Paternal Uncle    Lung cancer Paternal Uncle    Breast cancer Cousin 75   Breast cancer Cousin        dx <50   Breast cancer Cousin        dx <50   Breast cancer Cousin        dx <50   Heart  disease Other    Hypertension Other    Stroke Other        Grandparent   Kidney disease Other        Grandparent   Diabetes Other        FH of Diabetes   Colon cancer Neg Hx    Esophageal cancer Neg Hx    Rectal cancer Neg Hx    Stomach cancer Neg Hx     Allergies  Allergen Reactions   Genvoya [Elviteg-Cobic-Emtricit-Tenofaf] Hives   Lisinopril Cough   Tape Rash    Rash  Okay with tegaderm and paper tape   Aldactone [Spironolactone] Hives   Tegaderm Ag Mesh [Silver] Itching    Current Outpatient Medications on File Prior to Visit  Medication Sig Dispense Refill   acetaminophen (TYLENOL) 500 MG tablet Take 1,000 mg by mouth every 6 (six) hours as needed for mild pain.     albuterol (VENTOLIN HFA) 108 (90 Base) MCG/ACT inhaler Inhale 2 puffs into the lungs every 6 (six) hours as needed for wheezing or shortness of breath. 8 g 2   allopurinol (ZYLOPRIM) 100 MG tablet TAKE 1 TABLET BY MOUTH EVERY DAY FOR GOUT PREVENTION (Patient taking differently: Take 100 mg by mouth daily. For gout prevention) 90 tablet 0   amLODipine (NORVASC) 5 MG tablet Take 1 tablet (5 mg total) by mouth daily. 30 tablet 0   aspirin 81 MG chewable tablet Chew 1 tablet (81 mg total) by mouth daily. 30 tablet 0   Blood Glucose Monitoring Suppl (FREESTYLE LITE) w/Device KIT Inject 1 each into the skin daily. 1 kit 0   carvedilol (COREG) 25 MG tablet Take 1 tablet (25 mg total) by mouth 2 (two) times daily. 180 tablet 3   cetirizine (ZYRTEC) 10 MG tablet TAKE 1 TABLET BY MOUTH AT BEDTIME FOR ALLERGIES (Patient taking differently: Take 10 mg by mouth at bedtime.) 90 tablet 0   cyclobenzaprine (FLEXERIL) 5 MG tablet Take 1 tablet (5 mg total) by mouth 3 (three) times daily as needed for muscle spasms. 15 tablet 0   dolutegravir ([name]) 50 MG tablet TAKE 1 TABLET BY MOUTH EVERY DAY 30 tablet 0   emtricitabine-tenofovir AF (DESCOVY) 200-25 MG tablet TAKE 1 TABLET BY MOUTH EVERY DAY 30 tablet 0   FLOVENT HFA 110  MCG/ACT inhaler TAKE 1 PUFF BY MOUTH TWICE A DAY (Patient taking differently: Inhale 1 puff into the lungs 2 (two) times daily as needed (wheezing/shortness of breath).) 36 Inhaler 1   fluticasone (FLONASE) 50 MCG/ACT nasal spray Place 2 sprays into both nostrils daily. 16 g 6   glucose blood (ONETOUCH VERIO) test strip USE AS INSTRUCTED TO TEST BLOOD SUGAR up to 3 times daily. 300 each 3   Lancets (ONETOUCH ULTRASOFT) lancets Use as instructed to test blood sugar daily 100 each 5   levothyroxine (SYNTHROID) 175 MCG tablet TAKE 1 TABLET BY MOUTH EVERY DAY 30 tablet 7   losartan (COZAAR) 50 MG tablet Take 1 tablet (50 mg total) by mouth daily. 30 tablet 30   metoCLOPramide (  REGLAN) 5 MG tablet Take 1 tablet (5 mg total) by mouth every 6 (six) hours as needed for nausea. 20 tablet 0   minocycline (DYNACIN) 100 MG tablet Take 1 tablet (100 mg total) by mouth 2 (two) times daily. 60 tablet 11   omeprazole (PRILOSEC) 40 MG capsule Take 1 capsule (40 mg total) by mouth 2 (two) times daily before a meal. 180 capsule 0   ondansetron (ZOFRAN) 4 MG tablet Take 1 tablet (4 mg total) by mouth daily as needed for nausea or vomiting. 20 tablet 0   rosuvastatin (CRESTOR) 10 MG tablet TAKE 1 TABLET BY MOUTH EVERY DAY FOR CHOLESTEROL (Patient taking differently: Take 10 mg by mouth daily. FOR CHOLESTEROL) 90 tablet 2   tamoxifen (NOLVADEX) 20 MG tablet Take 1 tablet (20 mg total) by mouth daily. 90 tablet 4   traMADol (ULTRAM) 50 MG tablet Take 1 tablet (50 mg total) by mouth every 6 (six) hours as needed for moderate pain. 20 tablet 0   gabapentin (NEURONTIN) 300 MG capsule Take 300 mg by mouth daily. (Patient not taking: Reported on 03/07/2023)     No current facility-administered medications on file prior to visit.    BP (!) 150/82   Pulse 72   Temp (!) 96.8 F (36 C) (Temporal)   Ht 5\' 5"  (1.651 m)   Wt 190 lb (86.2 kg)   LMP 03/31/2014 (LMP Unknown)   SpO2 98%   BMI 31.62 kg/m  Objective:   Physical  Exam Cardiovascular:     Rate and Rhythm: Normal rate and regular rhythm.  Pulmonary:     Effort: Pulmonary effort is normal.     Breath sounds: Normal breath sounds.  Musculoskeletal:     Cervical back: Neck supple.  Skin:    General: Skin is warm and dry.  Neurological:     Mental Status: She is alert and oriented to person, place, and time.  Psychiatric:        Mood and Affect: Mood normal.           Assessment & Plan:  Type 2 diabetes mellitus with hyperlipidemia (HCC) Assessment & Plan: Well-controlled.  Start Januvia 50 mg daily to replace her prior glipizide prescription. She will notify if insurance will not cover.  Follow-up in 6 months.  Orders: -     SITagliptin Phosphate; Take 1 tablet (50 mg total) by mouth daily. for diabetes.  Dispense: 90 tablet; Refill: 0  Dissection of thoracoabdominal aorta (HCC) Assessment & Plan: Recent hospitalization. Hospital notes, labs, imaging reviewed.  We discussed that she will notify her surgeon of her upper back pain as we need to rule out any complications from surgery.  She agreed. Continue tramadol 50 mg every 6 hours as needed, cyclobenzaprine 5 mg as needed, Tylenol as needed.  Follow-up with vascular services as scheduled.   Hypertension, unspecified type Assessment & Plan: Uncontrolled, however she is due for dialysis tomorrow.  Given drops in blood pressure during dialysis we will leave medication as is for now.  I will defer any medication changes to nephrology.  Continue amlodipine 5 mg daily, carvedilol 25 mg twice daily, losartan 50 mg daily.   Laryngopharyngeal reflux (LPR) Assessment & Plan: Controlled.  Continue omeprazole 40 mg twice daily.    Hypothyroidism, unspecified type Assessment & Plan: Continue levothyroxine 175 mcg tablets daily. Following with endocrinology, TSH reviewed from November 2023.   Neuropathy due to chemotherapeutic drug [facility]) Assessment & Plan: No longer on  gabapentin.  Consider  resuming for back pain if needed.  She will update.   CKD (chronic kidney disease) stage V requiring chronic dialysis [facility]) Assessment & Plan: Continue hemodialysis on Monday, Wednesday, Friday. Following with nephrology.  Continue to follow with nutritionist.   Chronic gout without tophus, unspecified cause, unspecified site Assessment & Plan: Continue allopurinol 100 mg daily.   HIV infection, unspecified symptom status (HCC) Assessment & Plan: Following with infectious disease.  Continue Descovy 200-25 mg daily, [name] 50 mg daily.   Hyperlipidemia, unspecified hyperlipidemia type Assessment & Plan: Continue rosuvastatin 10 mg daily.   Malnutrition of moderate degree (HCC) Assessment & Plan: Stable.  Continue to work with nutritionist.   Acute bilateral thoracic back pain Assessment & Plan: Unclear if this is an expected or unexpected finding since her TEVAR. She will contact her surgeon's office today.  If back pain is presumed musculoskeletal in nature we can consider adding gabapentin. Discussed to use tramadol sparingly as needed. Continue Tylenol and cyclobenzaprine.   At risk for sleep apnea Assessment & Plan: No noted documentation regarding her episode of apnea. Regardless, she is at high risk.  Referral placed to pulmonology for sleep study.  Orders: -     Ambulatory referral to Pulmonology        Doreene Nest, NP

## 2023-03-07 NOTE — Telephone Encounter (Signed)
Please call patient:  Her insurance company will not cover Januvia for diabetes.  It looks like they may cover the medication that is very similar called on Onglyza.  I sent this prescription to her pharmacy.  Take 1 tablet by mouth once daily for diabetes.

## 2023-03-07 NOTE — Assessment & Plan Note (Signed)
Recent hospitalization. Hospital notes, labs, imaging reviewed.  We discussed that she will notify her surgeon of her upper back pain as we need to rule out any complications from surgery.  She agreed. Continue tramadol 50 mg every 6 hours as needed, cyclobenzaprine 5 mg as needed, Tylenol as needed.  Follow-up with vascular services as scheduled.

## 2023-03-08 ENCOUNTER — Telehealth: Payer: Self-pay | Admitting: Primary Care

## 2023-03-08 NOTE — Telephone Encounter (Signed)
Sree from [facility] is requesting a phone call to  verify is patient is suppose to be currently taking Gabepentin, sertiraline , and tramadol, Januvia?

## 2023-03-08 NOTE — Telephone Encounter (Signed)
Called and spoke with [facility], he stated patient has stopped taking gabapentin and sertraline.  Verified she is taking tramadol and we had to switch her Januvia to [place] because insurance will not cover.

## 2023-03-08 NOTE — Telephone Encounter (Signed)
Called patient and reviewed all information. Patient verbalized understanding. Will call if any further questions.  

## 2023-03-09 ENCOUNTER — Telehealth: Payer: Self-pay

## 2023-03-09 ENCOUNTER — Inpatient Hospital Stay: Payer: Commercial Managed Care - HMO | Admitting: Primary Care

## 2023-03-09 ENCOUNTER — Emergency Department (HOSPITAL_COMMUNITY): Payer: Medicare Other

## 2023-03-09 ENCOUNTER — Encounter (HOSPITAL_COMMUNITY): Payer: Self-pay

## 2023-03-09 ENCOUNTER — Emergency Department (HOSPITAL_COMMUNITY)
Admission: EM | Admit: 2023-03-09 | Discharge: 2023-03-09 | Disposition: A | Discharge status: Home / Self Care | Payer: Medicare Other | Attending: Emergency Medicine | Admitting: Emergency Medicine

## 2023-03-09 ENCOUNTER — Other Ambulatory Visit: Payer: Self-pay

## 2023-03-09 DIAGNOSIS — N189 Chronic kidney disease, unspecified: Secondary | ICD-10-CM | POA: Insufficient documentation

## 2023-03-09 DIAGNOSIS — D72829 Elevated white blood cell count, unspecified: Secondary | ICD-10-CM | POA: Diagnosis not present

## 2023-03-09 DIAGNOSIS — Z7982 Long term (current) use of aspirin: Secondary | ICD-10-CM | POA: Diagnosis not present

## 2023-03-09 DIAGNOSIS — Z794 Long term (current) use of insulin: Secondary | ICD-10-CM | POA: Diagnosis not present

## 2023-03-09 DIAGNOSIS — I129 Hypertensive chronic kidney disease with stage 1 through stage 4 chronic kidney disease, or unspecified chronic kidney disease: Secondary | ICD-10-CM | POA: Diagnosis not present

## 2023-03-09 DIAGNOSIS — E1122 Type 2 diabetes mellitus with diabetic chronic kidney disease: Secondary | ICD-10-CM | POA: Insufficient documentation

## 2023-03-09 DIAGNOSIS — Z79899 Other long term (current) drug therapy: Secondary | ICD-10-CM | POA: Diagnosis not present

## 2023-03-09 DIAGNOSIS — Z21 Asymptomatic human immunodeficiency virus [HIV] infection status: Secondary | ICD-10-CM | POA: Insufficient documentation

## 2023-03-09 DIAGNOSIS — K5903 Drug induced constipation: Secondary | ICD-10-CM | POA: Insufficient documentation

## 2023-03-09 DIAGNOSIS — R1032 Left lower quadrant pain: Secondary | ICD-10-CM | POA: Diagnosis present

## 2023-03-09 LAB — CBC
HCT: 27.3 % — ABNORMAL LOW (ref 36.0–46.0)
Hemoglobin: 8.7 g/dL — ABNORMAL LOW (ref 12.0–15.0)
MCH: 30.9 pg (ref 26.0–34.0)
MCHC: 31.9 g/dL (ref 30.0–36.0)
MCV: 96.8 fL (ref 80.0–100.0)
Platelets: 357 10*3/uL (ref 150–400)
RBC: 2.82 MIL/uL — ABNORMAL LOW (ref 3.87–5.11)
RDW: 15.8 % — ABNORMAL HIGH (ref 11.5–15.5)
WBC: 12.6 10*3/uL — ABNORMAL HIGH (ref 4.0–10.5)
nRBC: 0 % (ref 0.0–0.2)

## 2023-03-09 LAB — COMPREHENSIVE METABOLIC PANEL
ALT: 13 U/L (ref 0–44)
AST: 16 U/L (ref 15–41)
Albumin: 2.5 g/dL — ABNORMAL LOW (ref 3.5–5.0)
Alkaline Phosphatase: 107 U/L (ref 38–126)
Anion gap: 13 (ref 5–15)
BUN: 27 mg/dL — ABNORMAL HIGH (ref 6–20)
CO2: 27 mmol/L (ref 22–32)
Calcium: 8.6 mg/dL — ABNORMAL LOW (ref 8.9–10.3)
Chloride: 94 mmol/L — ABNORMAL LOW (ref 98–111)
Creatinine, Ser: 4.85 mg/dL — ABNORMAL HIGH (ref 0.44–1.00)
GFR, Estimated: 10 mL/min — ABNORMAL LOW (ref >60)
Glucose, Bld: 152 mg/dL — ABNORMAL HIGH (ref 70–99)
Potassium: 3.3 mmol/L — ABNORMAL LOW (ref 3.5–5.1)
Sodium: 134 mmol/L — ABNORMAL LOW (ref 135–145)
Total Bilirubin: 0.5 mg/dL (ref 0.3–1.2)
Total Protein: 7.8 g/dL (ref 6.5–8.1)

## 2023-03-09 LAB — LIPASE, BLOOD: Lipase: 29 U/L (ref 11–51)

## 2023-03-09 MED ORDER — FLEET ENEMA 7-19 GM/118ML RE ENEM
1.0000 | ENEMA | Freq: Once | RECTAL | Status: AC
  Administered 2023-03-09: 1 via RECTAL
  Filled 2023-03-09: qty 1

## 2023-03-09 MED ORDER — POLYETHYLENE GLYCOL 3350 17 G PO PACK: 17.0000 g | PACK | Freq: Every day | ORAL | 0 refills | Status: DC

## 2023-03-09 MED ORDER — METAMUCIL SMOOTH TEXTURE 58.6 % PO POWD: 1.0000 | Freq: Three times a day (TID) | ORAL | 12 refills | Status: DC

## 2023-03-09 MED ORDER — POLYETHYLENE GLYCOL 3350 17 G PO PACK
17.0000 g | PACK | Freq: Once | ORAL | Status: AC
  Administered 2023-03-09: 17 g via ORAL
  Filled 2023-03-09: qty 1

## 2023-03-09 MED ORDER — FLEET ENEMA 7-19 GM/118ML RE ENEM: 1.0000 | ENEMA | Freq: Once | RECTAL | 0 refills | Status: AC

## 2023-03-09 NOTE — Telephone Encounter (Signed)
[name] with [name] called to let us know pt is c/o no bowel movement since 5/20, when she had a very small one and prior to that it was 5/18. She is in 6/10 rectal pain, unable to sit on her bottom due to this. Per APP, he has been advised to call [facility]. I have given him their number. He will call us back if he needs further assistance.

## 2023-03-09 NOTE — Telephone Encounter (Signed)
Looks like patient has checked into the Emergency Department.  Will await ED notes.

## 2023-03-09 NOTE — ED Provider Notes (Signed)
[place] EMERGENCY DEPARTMENT AT [facility] Provider Note   CSN: [id number] Arrival date & time: 03/09/23  1511     History  Chief Complaint  Patient presents with   Abdominal Pain    [name] is a 57 y.o. female.  Patient is a 57 year old female with a PMH diabetes, hypertension, HIV on [place], CKD and recent admission for an ascending aortic dissection and SBO presenting to the emergency department with constipation.  The patient states that she was discharged home on tramadol for pain and has been taking a daily stool softener that is over-the-counter with this.  She states that she has not had a bowel movement since Friday.  She states that she has some rectal pain feels like she needs to have a bowel movement but states that she has been unable to pass stool and every time she sits on the toilet she becomes hot and sweaty.  She states that she has been passing gas.  She states that the last 2 days she doubled her stool softener and still has not had any bowel movements.  She denies any abdominal pain.  She denies any fevers or chills.  She states that she has had some nausea but denies any vomiting.  The history is provided by the patient.  Abdominal Pain      Home Medications Prior to Admission medications   Medication Sig Start Date End Date Taking? Authorizing Provider  polyethylene glycol (MIRALAX) 17 g packet Take 17 g by mouth daily. 03/09/23  Yes Theresia Lo, Allahna Husband K, DO  psyllium (METAMUCIL SMOOTH TEXTURE) 58.6 % powder Take 1 packet by mouth 3 (three) times daily. 03/09/23  Yes Theresia Lo, [place] K, DO  sodium phosphate (FLEET) 7-19 GM/118ML ENEM Place 133 mLs (1 enema total) rectally once for 1 dose. 03/09/23 03/09/23 Yes Elayne Snare K, DO  acetaminophen (TYLENOL) 500 MG tablet Take 1,000 mg by mouth every 6 (six) hours as needed for mild pain.    [provider]  albuterol (VENTOLIN HFA) 108 (90 Base) MCG/ACT inhaler Inhale 2 puffs  into the lungs every 6 (six) hours as needed for wheezing or shortness of breath. 10/07/20   Hall-Potvin, [place], PA-C  allopurinol (ZYLOPRIM) 100 MG tablet TAKE 1 TABLET BY MOUTH EVERY DAY FOR GOUT PREVENTION Patient taking differently: Take 100 mg by mouth daily. For gout prevention 01/11/23   Doreene Nest, NP  amLODipine (NORVASC) 5 MG tablet Take 1 tablet (5 mg total) by mouth daily. 02/22/23 03/24/23  Jerald Kief, MD  aspirin 81 MG chewable tablet Chew 1 tablet (81 mg total) by mouth daily. 02/22/23 03/24/23  Jerald Kief, MD  Blood Glucose Monitoring Suppl (FREESTYLE LITE) w/Device KIT Inject 1 each into the skin daily. 11/10/20   Chilton Si, MD  carvedilol (COREG) 25 MG tablet Take 1 tablet (25 mg total) by mouth 2 (two) times daily. 02/01/22   [place], [place], RPH-CPP  cetirizine (ZYRTEC) 10 MG tablet TAKE 1 TABLET BY MOUTH AT BEDTIME FOR ALLERGIES Patient taking differently: Take 10 mg by mouth at bedtime. 01/11/23   Doreene Nest, NP  cyclobenzaprine (FLEXERIL) 5 MG tablet Take 1 tablet (5 mg total) by mouth 3 (three) times daily as needed for muscle spasms. 02/21/23   Jerald Kief, MD  dolutegravir ([name]) 50 MG tablet TAKE 1 TABLET BY MOUTH EVERY DAY 01/11/23   Cliffton Asters, MD  emtricitabine-tenofovir AF (DESCOVY) 200-25 MG tablet TAKE 1 TABLET BY MOUTH EVERY  DAY 01/11/23   Cliffton Asters, MD  FLOVENT HFA 110 MCG/ACT inhaler TAKE 1 PUFF BY MOUTH TWICE A DAY Patient taking differently: Inhale 1 puff into the lungs 2 (two) times daily as needed (wheezing/shortness of breath). 08/27/19   Doreene Nest, NP  fluticasone (FLONASE) 50 MCG/ACT nasal spray Place 2 sprays into both nostrils daily. 07/19/21   Eden Emms, NP  gabapentin (NEURONTIN) 300 MG capsule Take 300 mg by mouth daily. Patient not taking: Reported on 03/07/2023    [provider]  glucose blood (ONETOUCH VERIO) test strip USE AS INSTRUCTED TO TEST BLOOD SUGAR up to 3 times daily. 11/12/21    Doreene Nest, NP  Lancets [facility] ULTRASOFT) lancets Use as instructed to test blood sugar daily 11/25/20   Doreene Nest, NP  levothyroxine (SYNTHROID) 175 MCG tablet TAKE 1 TABLET BY MOUTH EVERY DAY 10/18/22   Carlus Pavlov, MD  losartan (COZAAR) 50 MG tablet Take 1 tablet (50 mg total) by mouth daily. 02/22/23 03/24/23  Jerald Kief, MD  metoCLOPramide (REGLAN) 5 MG tablet Take 1 tablet (5 mg total) by mouth every 6 (six) hours as needed for nausea. 02/21/23   Jerald Kief, MD  minocycline (DYNACIN) 100 MG tablet Take 1 tablet (100 mg total) by mouth 2 (two) times daily. 06/09/22   Cliffton Asters, MD  omeprazole (PRILOSEC) 40 MG capsule Take 1 capsule (40 mg total) by mouth 2 (two) times daily before a meal. 12/19/22   Doree Albee, PA-C  ondansetron (ZOFRAN) 4 MG tablet Take 1 tablet (4 mg total) by mouth daily as needed for nausea or vomiting. 02/21/23   Jerald Kief, MD  rosuvastatin (CRESTOR) 10 MG tablet TAKE 1 TABLET BY MOUTH EVERY DAY FOR CHOLESTEROL Patient taking differently: Take 10 mg by mouth daily. FOR CHOLESTEROL 09/20/22   Doreene Nest, NP  saxagliptin HCl (ONGLYZA) 2.5 MG TABS tablet Take 1 tablet (2.5 mg total) by mouth daily. for diabetes. 03/07/23   Doreene Nest, NP  tamoxifen (NOLVADEX) 20 MG tablet Take 1 tablet (20 mg total) by mouth daily. 07/27/21   Magrinat, Valentino Hue, MD  traMADol (ULTRAM) 50 MG tablet Take 1 tablet (50 mg total) by mouth every 6 (six) hours as needed for moderate pain. 02/21/23   Jerald Kief, MD      Allergies    Darrin Luis [elviteg-cobic-emtricit-tenofaf], Lisinopril, Tape, Aldactone [spironolactone], and Tegaderm ag mesh [silver]    Review of Systems   Review of Systems  Gastrointestinal:  Positive for abdominal pain.    Physical Exam Updated Vital Signs BP (!) 146/66   Pulse 65   Temp 97.8 F (36.6 C) (Oral)   Resp 18   LMP 03/31/2014 (LMP Unknown)   SpO2 100%  Physical Exam Vitals and nursing note reviewed.   Constitutional:      General: She is not in acute distress.    Appearance: She is well-developed.  HENT:     Head: Normocephalic and atraumatic.     Mouth/Throat:     Mouth: Mucous membranes are moist.     Pharynx: Oropharynx is clear.  Eyes:     Extraocular Movements: Extraocular movements intact.  Cardiovascular:     Rate and Rhythm: Normal rate and regular rhythm.     Heart sounds: Normal heart sounds.  Pulmonary:     Effort: Pulmonary effort is normal.     Breath sounds: Normal breath sounds.  Abdominal:     General: Abdomen is flat.  Palpations: Abdomen is soft.     Tenderness: There is no abdominal tenderness.  Skin:    General: Skin is warm and dry.  Neurological:     General: No focal deficit present.     Mental Status: She is alert and oriented to person, place, and time.  Psychiatric:        Mood and Affect: Mood normal.        Behavior: Behavior normal.     ED Results / Procedures / Treatments   Labs (all labs ordered are listed, but only abnormal results are displayed) Labs Reviewed  COMPREHENSIVE METABOLIC PANEL - Abnormal; Notable for the following components:      Result Value   Sodium 134 (*)    Potassium 3.3 (*)    Chloride 94 (*)    Glucose, Bld 152 (*)    BUN 27 (*)    Creatinine, Ser 4.85 (*)    Calcium 8.6 (*)    Albumin 2.5 (*)    GFR, Estimated 10 (*)    All other components within normal limits  CBC - Abnormal; Notable for the following components:   WBC 12.6 (*)    RBC 2.82 (*)    Hemoglobin 8.7 (*)    HCT 27.3 (*)    RDW 15.8 (*)    All other components within normal limits  LIPASE, BLOOD  URINALYSIS, ROUTINE W REFLEX MICROSCOPIC    EKG None  Radiology DG Abd Portable 1 View  Result Date: 03/09/2023 CLINICAL DATA:  Small bowel obstruction EXAM: PORTABLE ABDOMEN - 1 VIEW COMPARISON:  02/13/2023 FINDINGS: The feeding tube is been removed. Lower thoracic aortic stent noted. No significant residual dilated small bowel is  currently identified. There is substantial stool in the colon raising suspicion for potential mild constipation. Otherwise unremarkable bowel gas pattern. IMPRESSION: 1. No dilated small bowel is currently identified. 2. Substantial stool in the colon favors mild constipation. 3. Lower thoracic aortic stent noted. Electronically Signed   By: Gaylyn Rong M.D.   On: 03/09/2023 18:12    Procedures Fecal disimpaction  Date/Time: 03/09/2023 9:38 PM  Performed by: Rexford Maus, DO Authorized by: Rexford Maus, DO  Consent: Verbal consent obtained. Risks and benefits: risks, benefits and alternatives were discussed Consent given by: patient Patient understanding: patient states understanding of the procedure being performed Imaging studies: imaging studies available Patient identity confirmed: verbally with patient Local anesthesia used: no  Anesthesia: Local anesthesia used: no  Sedation: Patient sedated: no  Patient tolerance: patient tolerated the procedure well with no immediate complications       Medications Ordered in ED Medications  sodium phosphate (FLEET) 7-19 GM/118ML enema 1 enema (has no administration in time range)  sodium phosphate (FLEET) 7-19 GM/118ML enema 1 enema (1 enema Rectal Given 03/09/23 1939)  polyethylene glycol (MIRALAX / GLYCOLAX) packet 17 g (17 g Oral Given 03/09/23 1939)    ED Course/ Medical Decision Making/ A&P Clinical Course as of 03/09/23 2143  Thu Mar 09, 2023  1819 No obstruction on abd XR with constipation, unable to determine by XR if she has a fecal impaction. Patient was offered attempt at disimpaction vs enema here vs at home. [VK]  2052 No improvement with enema. Will attempt manual disimpaction.  [VK]  2136 Attempted disimpaction and patient only able to tolerate a small amount of stool removal. She was able to pass a small amount of stool on her own as well. She is stable for discharge home with continued  bowel  regimine and will be given strict return precautions. [VK]    Clinical Course User Index [VK] Rexford Maus, DO                             Medical Decision Making This patient presents to the ED with chief complaint(s) of constipation with pertinent past medical history of CKD, DM, HTN, recent admission for type A dissection and SBO which further complicates the presenting complaint. The complaint involves an extensive differential diagnosis and also carries with it a high risk of complications and morbidity.    The differential diagnosis includes constipation, fecal impaction, SBO, intra-abdominal infection less likely as no fever or point abdominal tenderness, dissection unlikely as no significant abdominal or back pain and is hemodynamically stable  Additional history obtained: Additional history obtained from family Records reviewed previous admission documents  ED Course and Reassessment: On patient's arrival to the emergency department she was initially evaluated in triage and had labs performed that showed mild leukocytosis, stable anemia and Cr from baseline. Abd XR will be performed to evaluate for constipation vs obstruction and she will be closely reassessed.   Independent labs interpretation:  The following labs were independently interpreted: Mild leukocytosis, labs otherwise at baseline  Independent visualization of imaging: - I independently visualized the following imaging with scope of interpretation limited to determining acute life threatening conditions related to emergency care: Abd XR, which revealed non-obstructive bowel pattern with constipation  Consultation: - Consulted or discussed management/test interpretation w/ external professional: N/A  Consideration for admission or further workup: Patient has no emergent conditions requiring admission or further work-up at this time and is stable for discharge home with primary care follow-up  Social  Determinants of health: N/A    Amount and/or Complexity of Data Reviewed Labs: ordered. Radiology: ordered.  Risk OTC drugs.          Final Clinical Impression(s) / ED Diagnoses Final diagnoses:  Drug-induced constipation    Rx / DC Orders ED Discharge Orders          Ordered    sodium phosphate (FLEET) 7-19 GM/118ML ENEM   Once        03/09/23 2142    polyethylene glycol (MIRALAX) 17 g packet  Daily        03/09/23 2142    psyllium (METAMUCIL SMOOTH TEXTURE) 58.6 % powder  3 times daily        03/09/23 2142              Rexford Maus, DO 03/09/23 2143

## 2023-03-09 NOTE — Telephone Encounter (Signed)
Rhunette Croft OT with [facility] is with pt whos last normal BM was 03/03/23. Pt has had constipation since the weekend; on 03/06/23 had small pebble BM. Pt has not seen rectal bleeding. Pt has lower left abd pain that is not terrible; could not put # on pain level. Pt has rectal pain 6-10 and if sitting position pain level increases 8 - 10. Pt said started Docusate Na OTC on 02/26/23 and pt started taking 2 Docusate Na on 03/07/23 with no relief. Pt is limited to 36 oz of liquid since pt is on dialysis. Pt started dialysis 3 wks ago. Pt has no fever and was recently in hospital for aortic dissection and small bowel obstruction. Pt does not have appt to see surgeon for another 2 wks. Advised Rhunette Croft OT to contact surgeon with above symptoms and Roe Coombs said OK but still wants cb after reviewed by Allayne Gitelman NP. Sending note to Allayne Gitelman NP and [facility]. Spoke with [facility] CMA.

## 2023-03-09 NOTE — ED Triage Notes (Signed)
Pt c/o LLQ abdominal pain and constipation. Pt has not had BM since Friday. Recently admitted for SBO and aortic dissection. Nausea without emesis per pt.

## 2023-03-09 NOTE — Discharge Instructions (Signed)
You were seen in the emergency department for your constipation.  This likely occurred because of being on the narcotic pain medication which can induce constipation.  We did give you an enema and attempted a disimpaction and were only able to get a small amount of stool out.  You should continue to take MiraLAX daily until you are having regular soft bowel movements every day.  You should also take Metamucil which is a fiber supplement daily as well as her over-the-counter stool softeners.  You can continue to give yourself the Fleet enemas daily until you are having bowel movements.  You can follow-up with your primary doctor in the next few days to have your symptoms rechecked.  You should return to the emergency department if you have severe abdominal pain, repetitive vomiting, fevers or any other new or concerning symptoms.

## 2023-03-10 ENCOUNTER — Telehealth: Payer: Self-pay

## 2023-03-10 NOTE — Transitions of Care (Post Inpatient/ED Visit) (Signed)
03/10/2023  Name: [name] MRN: [id number] DOB: 1966-07-12  Today's TOC FU Call Status: Today's TOC FU Call Status:: Successful TOC FU Call Competed TOC FU Call Complete Date: 03/10/23  Transition Care Management Follow-up Telephone Call Date of Discharge: 03/09/23 Discharge Facility: [facility]) Type of Discharge: Emergency Department Reason for ED Visit: Other: (constipation) How have you been since you were released from the hospital?: Better (had bm this morning, to do another enema) Any questions or concerns?: No  Items Reviewed: Did you receive and understand the discharge instructions provided?: Yes Medications obtained,verified, and reconciled?: Yes (Medications Reviewed) Any new allergies since your discharge?: No Dietary orders reviewed?: NA Do you have support at home?: Yes  Medications Reviewed Today: Medications Reviewed Today     Reviewed by Barb Merino, LPN (Licensed Practical Nurse) on 03/10/23 at 1017  Med List Status: <None>   Medication Order Taking? Sig Documenting Provider Last Dose Status Informant  acetaminophen (TYLENOL) 500 MG tablet [id number] No Take 1,000 mg by mouth every 6 (six) hours as needed for mild pain. [provider] Taking Active Self  albuterol (VENTOLIN HFA) 108 (90 Base) MCG/ACT inhaler [id number] No Inhale 2 puffs into the lungs every 6 (six) hours as needed for wheezing or shortness of breath. Hall-Potvin, [place], PA-C Taking Active Self  allopurinol (ZYLOPRIM) 100 MG tablet [id number] No TAKE 1 TABLET BY MOUTH EVERY DAY FOR GOUT PREVENTION  Patient taking differently: Take 100 mg by mouth daily. For gout prevention   Doreene Nest, NP Taking Active Self  amLODipine (NORVASC) 5 MG tablet [id number] No Take 1 tablet (5 mg total) by mouth daily. Jerald Kief, MD Taking Active   aspirin 81 MG chewable tablet [id number] No Chew 1 tablet (81 mg total) by mouth daily. Jerald Kief, MD Taking Active    Blood Glucose Monitoring Suppl (FREESTYLE LITE) w/Device KIT [id number] No Inject 1 each into the skin daily. Chilton Si, MD Taking Active Self  carvedilol (COREG) 25 MG tablet [id number] No Take 1 tablet (25 mg total) by mouth 2 (two) times daily. Rosalee Kaufman, RPH-CPP Taking Active Self           Med Note (WHITE, Elvin So   Sun Jan 29, 2023  4:16 AM) Rochele Pages only one dose yesterday  cetirizine (ZYRTEC) 10 MG tablet [id number] No TAKE 1 TABLET BY MOUTH AT BEDTIME FOR ALLERGIES  Patient taking differently: Take 10 mg by mouth at bedtime.   Doreene Nest, NP Taking Active Self  cyclobenzaprine (FLEXERIL) 5 MG tablet [id number] No Take 1 tablet (5 mg total) by mouth 3 (three) times daily as needed for muscle spasms. Jerald Kief, MD Taking Active   dolutegravir [facility]) 50 MG tablet [id number] No TAKE 1 TABLET BY MOUTH EVERY DAY Cliffton Asters, MD Taking Active Self  emtricitabine-tenofovir AF (DESCOVY) 200-25 MG tablet [id number] No TAKE 1 TABLET BY MOUTH EVERY DAY Cliffton Asters, MD Taking Active Self  FLOVENT HFA 110 MCG/ACT inhaler [id number] No TAKE 1 PUFF BY MOUTH TWICE A DAY  Patient taking differently: Inhale 1 puff into the lungs 2 (two) times daily as needed (wheezing/shortness of breath).   Doreene Nest, NP Taking Active Self  fluticasone (FLONASE) 50 MCG/ACT nasal spray [id number] No Place 2 sprays into both nostrils daily. Eden Emms, NP Taking Active Self  gabapentin (NEURONTIN) 300 MG capsule [id number] No Take 300 mg by mouth daily.  Patient not taking: Reported on 03/07/2023   [provider] Not Taking Active Self  glucose blood (ONETOUCH VERIO) test strip [id number] No USE AS INSTRUCTED TO TEST BLOOD SUGAR up to 3 times daily. Doreene Nest, NP Taking Active Self  Lancets [facility] ULTRASOFT) lancets [id number] No Use as instructed to test blood sugar daily Doreene Nest, NP Taking Active Self  levothyroxine (SYNTHROID) 175 MCG tablet [id number]  No TAKE 1 TABLET BY MOUTH EVERY DAY Carlus Pavlov, MD Taking Active Self  losartan (COZAAR) 50 MG tablet [id number] No Take 1 tablet (50 mg total) by mouth daily. Jerald Kief, MD Taking Active   metoCLOPramide (REGLAN) 5 MG tablet [id number] No Take 1 tablet (5 mg total) by mouth every 6 (six) hours as needed for nausea. Jerald Kief, MD Taking Active   minocycline [facility]) 100 MG tablet [id number] No Take 1 tablet (100 mg total) by mouth 2 (two) times daily. Cliffton Asters, MD Taking Active Self           Med Note [facility], [place] Jan 29, 2023  4:20 AM) Continuous therapy for Hidradenitis suppurativa  omeprazole (PRILOSEC) 40 MG capsule [id number] No Take 1 capsule (40 mg total) by mouth 2 (two) times daily before a meal. Doree Albee, PA-C Taking Active Self  ondansetron (ZOFRAN) 4 MG tablet [id number] No Take 1 tablet (4 mg total) by mouth daily as needed for nausea or vomiting. Jerald Kief, MD Taking Active   polyethylene glycol (MIRALAX) 17 g packet [id number]  Take 17 g by mouth daily. Elayne Snare K, DO  Active   psyllium (METAMUCIL SMOOTH TEXTURE) 58.6 % powder [id number]  Take 1 packet by mouth 3 (three) times daily. Elayne Snare K, DO  Active   rosuvastatin (CRESTOR) 10 MG tablet [id number] No TAKE 1 TABLET BY MOUTH EVERY DAY FOR CHOLESTEROL  Patient taking differently: Take 10 mg by mouth daily. FOR CHOLESTEROL   Doreene Nest, NP Taking Active Self  saxagliptin HCl (ONGLYZA) 2.5 MG TABS tablet [id number]  Take 1 tablet (2.5 mg total) by mouth daily. for diabetes. Doreene Nest, NP  Active   tamoxifen (NOLVADEX) 20 MG tablet [id number] No Take 1 tablet (20 mg total) by mouth daily. Magrinat, Valentino Hue, MD Taking Active Self  traMADol (ULTRAM) 50 MG tablet [id number] No Take 1 tablet (50 mg total) by mouth every 6 (six) hours as needed for moderate pain. Jerald Kief, MD Taking Active             Home Care and Equipment/Supplies: Were Home  Health Services Ordered?: NA Has Agency set up a time to come to your home?: No EMR reviewed for Home Health Orders: Home Health Not Ordered Any new equipment or medical supplies ordered?: NA  Functional Questionnaire: Do you need assistance with bathing/showering or dressing?: No Do you need assistance with meal preparation?: No Do you need assistance with eating?: No Do you have difficulty maintaining continence: No Do you need assistance with getting out of bed/getting out of a chair/moving?: No Do you have difficulty managing or taking your medications?: No  Follow up appointments reviewed: PCP Follow-up appointment confirmed?: No (declined visit) Specialist Hospital Follow-up appointment confirmed?: NA Do you need transportation to your follow-up appointment?: No Do you understand care options if your condition(s) worsen?: Yes-patient verbalized understanding    SIGNATURE Elisha Ponder LPN [facility] AWV Team Direct dial:  [phone]

## 2023-03-13 ENCOUNTER — Other Ambulatory Visit: Payer: Self-pay | Admitting: Cardiovascular Disease

## 2023-03-14 ENCOUNTER — Other Ambulatory Visit: Payer: Self-pay

## 2023-03-14 ENCOUNTER — Other Ambulatory Visit: Payer: Self-pay | Admitting: Primary Care

## 2023-03-14 DIAGNOSIS — J069 Acute upper respiratory infection, unspecified: Secondary | ICD-10-CM

## 2023-03-14 DIAGNOSIS — B20 Human immunodeficiency virus [HIV] disease: Secondary | ICD-10-CM

## 2023-03-14 MED ORDER — DESCOVY 200-25 MG PO TABS
1.0000 | ORAL_TABLET | Freq: Every day | ORAL | 1 refills | Status: DC
Start: 2023-03-14 — End: 2023-05-29

## 2023-03-14 MED ORDER — TIVICAY 50 MG PO TABS
50.0000 mg | ORAL_TABLET | Freq: Every day | ORAL | 1 refills | Status: DC
Start: 2023-03-14 — End: 2023-05-29

## 2023-03-15 ENCOUNTER — Encounter (HOSPITAL_BASED_OUTPATIENT_CLINIC_OR_DEPARTMENT_OTHER): Payer: Self-pay | Admitting: Cardiovascular Disease

## 2023-03-15 NOTE — Telephone Encounter (Signed)
Patient has been called x 4 by Damaris Schooner earlier this month to schedule her overdue follow up.  WIll mail letter requesting she call to schedule

## 2023-03-20 ENCOUNTER — Telehealth: Payer: Self-pay | Admitting: Primary Care

## 2023-03-20 NOTE — Telephone Encounter (Signed)
Approved.  

## 2023-03-20 NOTE — Telephone Encounter (Signed)
Fayrene Fearing from [facility] called to request a discharge from [facility] physical therapy for the pt. Call back # [phone]

## 2023-03-21 ENCOUNTER — Telehealth: Payer: Self-pay | Admitting: Primary Care

## 2023-03-21 ENCOUNTER — Telehealth: Payer: Self-pay

## 2023-03-21 ENCOUNTER — Ambulatory Visit (HOSPITAL_COMMUNITY): Admission: RE | Admit: 2023-03-21 | Payer: Commercial Managed Care - HMO | Source: Ambulatory Visit

## 2023-03-21 DIAGNOSIS — I10 Essential (primary) hypertension: Secondary | ICD-10-CM

## 2023-03-21 MED ORDER — LOSARTAN POTASSIUM 50 MG PO TABS
50.0000 mg | ORAL_TABLET | Freq: Every day | ORAL | 3 refills | Status: DC
Start: 2023-03-21 — End: 2023-12-15

## 2023-03-21 NOTE — Addendum Note (Signed)
Addended by: Doreene Nest on: 03/21/2023 03:58 PM   Modules accepted: Orders

## 2023-03-21 NOTE — Telephone Encounter (Signed)
Refills sent to pharmacy. 

## 2023-03-21 NOTE — Telephone Encounter (Signed)
Pt called stating that she was given Losartan at d/c from hospital, but has run out as of 2 days ago. She is requesting a refill.  Reviewed pt's chart, returned call for clarification, two identifiers used. Pt has been on this medication prescribed by her PCP since 2017. Instructed her to call her PCP for refills. Confirmed understanding.

## 2023-03-21 NOTE — Telephone Encounter (Signed)
Called and advised Fayrene Fearing with [name]  of the approval of the requested verbal orders for this patient. Advised to call back with any further questions.

## 2023-03-21 NOTE — Telephone Encounter (Signed)
Prescription Request  03/21/2023  LOV: 03/07/2023  What is the name of the medication or equipment? losartan (COZAAR) 50 MG tablet [[id number]]   Have you contacted your pharmacy to request a refill? No   Which pharmacy would you like this sent to?    [id number] [place], [place] - [address] [place] [address] Phone: [phone] Fax: [phone]    Patient notified that their request is being sent to the clinical staff for review and that they should receive a response within 2 business days.   Please advise at Mobile [phone] (mobile)

## 2023-03-22 ENCOUNTER — Other Ambulatory Visit: Payer: Self-pay | Admitting: *Deleted

## 2023-03-22 DIAGNOSIS — N186 End stage renal disease: Secondary | ICD-10-CM

## 2023-03-23 ENCOUNTER — Telehealth: Payer: Self-pay | Admitting: Vascular Surgery

## 2023-03-23 NOTE — Telephone Encounter (Signed)
LVM to get CTA r/s

## 2023-03-27 ENCOUNTER — Telehealth: Payer: Self-pay | Admitting: Primary Care

## 2023-03-27 DIAGNOSIS — I10 Essential (primary) hypertension: Secondary | ICD-10-CM

## 2023-03-27 MED ORDER — AMLODIPINE BESYLATE 5 MG PO TABS
5.0000 mg | ORAL_TABLET | Freq: Every day | ORAL | 3 refills | Status: DC
Start: 2023-03-27 — End: 2023-06-08

## 2023-03-27 NOTE — Telephone Encounter (Signed)
Prescription Request  03/27/2023  LOV: 03/07/2023  What is the name of the medication or equipment?  amLODipine (NORVASC) 5 MG tablet  Have you contacted your pharmacy to request a refill? Yes   Which pharmacy would you like this sent to?   [id number] [place], [place] - [address] [place] [address] Phone: [phone] Fax: [phone]  Patient said she could not get it refilled because it wasn' on her current med list, but she needs this medication  Please advise at [facility] [phone]

## 2023-03-28 ENCOUNTER — Telehealth: Payer: Self-pay | Admitting: Primary Care

## 2023-03-28 DIAGNOSIS — N898 Other specified noninflammatory disorders of vagina: Secondary | ICD-10-CM

## 2023-03-28 NOTE — Telephone Encounter (Signed)
Patient would like to know if she can have something sent in for a yeast infection? She stated that [name] knows that she just got out of the hospital,and is on a lot of antibiotics,that are causing her to have a yeast infection.   [id number] [place], [place] - [address] Phone: [phone]  Fax: [phone]

## 2023-03-29 MED ORDER — FLUCONAZOLE 150 MG PO TABS
150.0000 mg | ORAL_TABLET | Freq: Once | ORAL | 0 refills | Status: DC
Start: 2023-03-29 — End: 2023-04-01

## 2023-03-29 NOTE — Addendum Note (Signed)
Addended by: Doreene Nest on: 03/29/2023 03:59 PM   Modules accepted: Orders

## 2023-03-29 NOTE — Telephone Encounter (Signed)
Called and spoke to patient she states the only symptom she is experiencing is vaginal itching. She will log on to MyChart to view message.

## 2023-03-29 NOTE — Telephone Encounter (Signed)
Sure. Please ask what her symptoms are. Also, have her read her MyChart message that I sent on 03/07/23.

## 2023-03-29 NOTE — Telephone Encounter (Signed)
Noted. Prescription for fluconazole 150 mg tablet sent to pharmacy for presumed antibiotic induced vaginal yeast infection.  We will bring her into the office if her symptoms persist despite treatment.

## 2023-03-31 ENCOUNTER — Ambulatory Visit (HOSPITAL_COMMUNITY)
Admission: RE | Admit: 2023-03-31 | Discharge: 2023-03-31 | Disposition: A | Discharge status: Home / Self Care | Payer: Medicare Other | Source: Ambulatory Visit | Attending: Vascular Surgery | Admitting: Vascular Surgery

## 2023-03-31 ENCOUNTER — Ambulatory Visit (INDEPENDENT_AMBULATORY_CARE_PROVIDER_SITE_OTHER)
Admission: RE | Admit: 2023-03-31 | Discharge: 2023-03-31 | Disposition: A | Discharge status: Home / Self Care | Payer: Medicare Other | Source: Ambulatory Visit | Attending: Vascular Surgery

## 2023-03-31 ENCOUNTER — Encounter: Payer: Self-pay | Admitting: Vascular Surgery

## 2023-03-31 ENCOUNTER — Ambulatory Visit: Payer: Medicare Other | Admitting: Vascular Surgery

## 2023-03-31 ENCOUNTER — Other Ambulatory Visit: Payer: Self-pay | Admitting: Primary Care

## 2023-03-31 VITALS — BP 126/79 | HR 66 | Temp 98.2°F | Resp 20 | Ht 65.0 in | Wt 186.0 lb

## 2023-03-31 DIAGNOSIS — N186 End stage renal disease: Secondary | ICD-10-CM

## 2023-03-31 DIAGNOSIS — Z992 Dependence on renal dialysis: Secondary | ICD-10-CM

## 2023-03-31 DIAGNOSIS — I7103 Dissection of thoracoabdominal aorta: Secondary | ICD-10-CM

## 2023-03-31 DIAGNOSIS — N898 Other specified noninflammatory disorders of vagina: Secondary | ICD-10-CM

## 2023-03-31 DIAGNOSIS — Z95828 Presence of other vascular implants and grafts: Secondary | ICD-10-CM

## 2023-03-31 NOTE — Telephone Encounter (Signed)
Patient called in stating that her daughter made a mistake and threw the pill in the  trash,because she thought it as trash on the table.Patient would ike to know if she can have another rx sent in please?  [id number] [place], [place] - [address] Phone: [phone]  Fax: [phone]

## 2023-03-31 NOTE — Progress Notes (Signed)
Office Note     HPI: [name] is a 57 y.o. 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  Exam, [name] was doing well, accompanied by her daughter.  Since hospital discharge, [name] has slowly improved.  Her appetite is picking up, with the use of cannabis Gummies.  She has had significant weight loss, but is excited about this, as she was obese at baseline.  Was hoping that her recent weight loss will help her to become a candidate for kidney transplant.  He is working on becoming more more active.  She denies lower extremity claudication, but states that she still gets weak, and has some shortness of breath with significant ambulation. He denies back pain, abdominal pain, postprandial pain.  Normal bowel movements, normal urination.  Currently being dialyzed through a right-sided tunneled dialysis catheter.  She is questioning whether she wants to pursue hemodialysis versus peritoneal dialysis.  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  Genes Analyzed (43 total): APC*, ATM*, AXIN2, BARD1, BMPR1A, BRCA1*, BRCA2*, BRIP1*, CDH1*, CDK4, CDKN2A,  CHEK2*, DICER1, GALNT12, HOXB13, MEN1, MLH1*, MRE11A, MSH2*, MSH3, MSH6*, MUTYH*, NBN, NF1*, NTHL1, PALB2*, PMS2*, POLD1, POLE, PTEN*, RAD50, RAD51C*, RAD51D*, RET, SDHB, SDHD, SMAD4, SMARCA4, STK11 and TP53* (sequencing and deletion/duplication); EGFR (s   GERD 07/24/2006   GRAVE'S DISEASE 01/01/2008   History of hidradenitis suppurativa    History of kidney stones    History of thrush    HIV DISEASE 07/24/2006   dx March 05   Hyperlipidemia    HYPERTENSION 07/24/2006   Hyperthyroidism 08/2006   Grave's Disease -diffuse radiotracer uptake 08/25/06 Thyroid scan-Cold nodule to R lower lobe of thyrorid   Ileus (HCC) 02/14/2023   Menometrorrhagia    hx of   Nephrolithiasis    Panniculitis 05/12/2020   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HCC)    METASTATIC PAPILLARY THYROID CARCINOMA per 01/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 Completed 2 yrs Prednisone after lung bx confirmation. No symptoms since then.   Sebaceous cyst 04/21/2020   Suppurative hidradenitis    Thyroid cancer (HCC)    THYROID NODULE, RIGHT 02/08/2007    Past Surgical History:  Procedure Laterality Date   APPLICATION OF WOUND VAC  N/A 01/20/2021   Procedure: APPLICATION OF WOUND VAC;  Surgeon: Peggye Form, DO;  Location: [facility];  Service: Plastics;  Laterality: N/A;   BREAST EXCISIONAL BIOPSY Right 04/26/2018   right axilla negative   BREAST EXCISIONAL BIOPSY Left 04/26/2018   left axilla negative   BREAST LUMPECTOMY Right 10/03/2018   malignant   BREAST LUMPECTOMY WITH RADIOACTIVE SEED AND  SENTINEL LYMPH NODE BIOPSY Right 10/03/2018   Procedure: RIGHT BREAST LUMPECTOMY WITH RADIOACTIVE SEED AND SENTINEL LYMPH NODE MAPPING;  Surgeon: Harriette Bouillon, MD;  Location: [facility];  Service: General;  Laterality: Right;   BREAST SURGERY  1997   Breast Reduction    CYSTOSCOPY W/ URETERAL STENT REMOVAL  11/09/2012   Procedure: CYSTOSCOPY WITH STENT REMOVAL;  Surgeon: Sebastian Ache, MD;  Location: [facility];  Service: Urology;  Laterality: Right;   CYSTOSCOPY WITH RETROGRADE PYELOGRAM, URETEROSCOPY AND STENT PLACEMENT  11/09/2012   Procedure: CYSTOSCOPY WITH RETROGRADE PYELOGRAM, URETEROSCOPY AND STENT PLACEMENT;  Surgeon: Sebastian Ache, MD;  Location: [facility];  Service: Urology;  Laterality: Left;  LEFT URETEROSCOPY, STONE MANIPULATION, left STENT exchange    CYSTOSCOPY WITH STENT PLACEMENT  10/02/2012   Procedure: CYSTOSCOPY WITH STENT PLACEMENT;  Surgeon: Sebastian Ache, MD;  Location: [facility];  Service: Urology;  Laterality: Left;   DEBRIDEMENT AND CLOSURE WOUND N/A 01/20/2021   Procedure: Excision of abdominal wound with closure;  Surgeon: Peggye Form, DO;  Location: [facility];  Service: Plastics;  Laterality: N/A;   DILATION AND CURETTAGE OF UTERUS  11/2002   s/p for 1st trimester nonviable pregnancy   EYE SURGERY     sty under eyelid   INCISE AND DRAIN ABCESS  08/2002   s/p I &D for righ inframmary fold hidradenitis   INCISION AND DRAINAGE PERITONSILLAR ABSCESS  12/2001   IR CV LINE INJECTION  06/07/2018   IR FLUORO GUIDE CV LINE RIGHT  01/30/2023   IR IMAGING GUIDED PORT INSERTION  06/20/2018   IR REMOVAL TUN ACCESS W/ PORT W/O FL MOD SED  06/20/2018   IR US GUIDE VASC ACCESS RIGHT  01/30/2023   IRRIGATION AND DEBRIDEMENT ABSCESS  01/31/2012   Procedure: IRRIGATION AND DEBRIDEMENT ABSCESS;  Surgeon: Kandis Cocking, MD;  Location: [facility];  Service: General;  Laterality: Right;  right breast and axilla    NEPHROLITHOTOMY  10/02/2012   Procedure: NEPHROLITHOTOMY  PERCUTANEOUS;  Surgeon: Sebastian Ache, MD;  Location: [facility];  Service: Urology;  Laterality: Right;  First Stage Percutaneous Nephrolithotomy with Surgeon Access, Left Ureteral Stent     NEPHROLITHOTOMY  10/04/2012   Procedure: NEPHROLITHOTOMY PERCUTANEOUS SECOND LOOK;  Surgeon: Sebastian Ache, MD;  Location: [facility];  Service: Urology;  Laterality: Right;      NEPHROLITHOTOMY  10/08/2012   Procedure: NEPHROLITHOTOMY PERCUTANEOUS;  Surgeon: Sebastian Ache, MD;  Location: [facility];  Service: Urology;  Laterality: Right;  THIRD STAGE, nephrostomy tube exchange x 2   NEPHROLITHOTOMY  10/11/2012   Procedure: NEPHROLITHOTOMY PERCUTANEOUS SECOND LOOK;  Surgeon: Sebastian Ache, MD;  Location: [facility];  Service: Urology;  Laterality: Right;  RIGHT 4 STAGE PERCUTANOUS NEPHROLITHOTOMY, right URETEROSCOPY WITH HOLMIUM LASER    PANNICULECTOMY N/A 12/21/2020   Procedure: PANNICULECTOMY;  Surgeon: Peggye Form, DO;  Location: [facility];  Service: Plastics;  Laterality: N/A;   PERCUTANEOUS NEPHROSTOLITHOTOMY  04/2022   PORT-A-CATH REMOVAL N/A 07/16/2020   Procedure: REMOVAL PORT-A-CATH;  Surgeon: Harriette Bouillon, MD;  Location: [facility];  Service: General;  Laterality:  N/A;   PORTACATH PLACEMENT Left 05/17/2018   Procedure: INSERTION PORT-A-CATH;  Surgeon: Abigail Miyamoto, MD;  Location: [facility];  Service: General;  Laterality: Left;   RADICAL NECK DISSECTION  04/12/2017   limited/notes 04/12/2017   RADICAL NECK DISSECTION N/A 04/12/2017   Procedure: RADICAL NECK DISSECTION;  Surgeon: Christia Reading, MD;  Location: [facility] OR;  Service: ENT;  Laterality: N/A;  limited neck dissection 2 hours total   REDUCTION MAMMAPLASTY Bilateral 1998   RIGHT/LEFT HEART CATH AND CORONARY ANGIOGRAPHY N/A 03/12/2020   Procedure: RIGHT/LEFT HEART CATH AND CORONARY ANGIOGRAPHY;  Surgeon: Dolores Patty, MD;  Location: [facility];  Service: Cardiovascular;  Laterality: N/A;   [place]   1994   THORACIC AORTIC ENDOVASCULAR STENT GRAFT N/A 02/02/2023   Procedure: THORACIC AORTIC ENDOVASCULAR STENT GRAFT;  Surgeon: Victorino Sparrow, MD;  Location: [facility] OR;  Service: Vascular;  Laterality: N/A;   THYROIDECTOMY  04/12/2017   completion/notes 04/12/2017   THYROIDECTOMY N/A 04/12/2017   Procedure: THYROIDECTOMY;  Surgeon: Christia Reading, MD;  Location: [facility] OR;  Service: ENT;  Laterality: N/A;  Completion Thyroidectomy   TOTAL THYROIDECTOMY  2010    Social History   Socioeconomic History   Marital status: Single    Spouse name: Not on file   Number of children: 1   Years of education: Not on file   Highest education level: Not on file  Occupational History   Occupation: Investment banker, corporate  Tobacco Use   Smoking status: Former    Packs/day: 0.50    Years: 15.00    Additional pack years: 0.00    Total pack years: 7.50    Types: Cigarettes    Start date: 04/12/2017    Quit date: 2019    Years since quitting: 5.4   Smokeless tobacco: Never  Vaping Use   Vaping Use: Never used  Substance and Sexual Activity   Alcohol use: Yes    Alcohol/week: 0.0 standard drinks of alcohol    Comment: social   Drug use: No   Sexual activity: Not Currently    Birth control/protection: Post-menopausal    Comment: declined condoms  Other Topics Concern   Not on file  Social History Narrative   Right Handed    Lives in a two story home   Social Determinants of Health   Financial Resource Strain: Not on file  Food Insecurity: No Food Insecurity (02/03/2023)   Hunger Vital Sign    Worried About Running Out of Food in the Last Year: Never true    Ran Out of Food in the Last Year: Never true  Transportation Needs: No Transportation Needs (02/03/2023)   PRAPARE - Administrator, Civil Service (Medical): No    Lack of Transportation (Non-Medical): No  Physical Activity: Not on file  Stress: Not on file  Social Connections: Not on file  Intimate Partner Violence: Not At Risk  (02/03/2023)   Humiliation, Afraid, Rape, and Kick questionnaire    Fear of Current or Ex-Partner: No    Emotionally Abused: No    Physically Abused: No    Sexually Abused: No   Family History  Problem Relation Age of Onset   Hypertension Mother    Cancer Mother        laryngeal   Heart disease Mother        stent   Pancreatic cancer Father    Hypertension Father    Lung cancer Father 65  hx smoking   Hypertension Sister    Hypertension Sister    Hypertension Brother    Breast cancer Maternal Aunt 46   Breast cancer Maternal Aunt        dx 60+   Cancer Maternal Uncle        Lung CA   Breast cancer Paternal Aunt 18   Breast cancer Paternal Aunt        dx 8's   Breast cancer Paternal Aunt        dx 50's   Prostate cancer Paternal Uncle    Prostate cancer Paternal Uncle    Lung cancer Paternal Uncle    Breast cancer Cousin 75   Breast cancer Cousin        dx <50   Breast cancer Cousin        dx <50   Breast cancer Cousin        dx <50   Heart disease Other    Hypertension Other    Stroke Other        Grandparent   Kidney disease Other        Grandparent   Diabetes Other        FH of Diabetes   Colon cancer Neg Hx    Esophageal cancer Neg Hx    Rectal cancer Neg Hx    Stomach cancer Neg Hx     Current Outpatient Medications  Medication Sig Dispense Refill   acetaminophen (TYLENOL) 500 MG tablet Take 1,000 mg by mouth every 6 (six) hours as needed for mild pain.     albuterol (VENTOLIN HFA) 108 (90 Base) MCG/ACT inhaler Inhale 2 puffs into the lungs every 6 (six) hours as needed for wheezing or shortness of breath. 8 g 2   allopurinol (ZYLOPRIM) 100 MG tablet TAKE 1 TABLET BY MOUTH EVERY DAY FOR GOUT PREVENTION (Patient taking differently: Take 100 mg by mouth daily. For gout prevention) 90 tablet 0   amLODipine (NORVASC) 5 MG tablet Take 1 tablet (5 mg total) by mouth daily. for blood pressure. 90 tablet 3   Blood Glucose Monitoring Suppl (FREESTYLE LITE)  w/Device KIT Inject 1 each into the skin daily. 1 kit 0   carvedilol (COREG) 25 MG tablet Take 1 tablet (25 mg total) by mouth 2 (two) times daily. 180 tablet 3   cetirizine (ZYRTEC) 10 MG tablet TAKE 1 TABLET BY MOUTH AT BEDTIME FOR ALLERGIES 90 tablet 1   cyclobenzaprine (FLEXERIL) 5 MG tablet Take 1 tablet (5 mg total) by mouth 3 (three) times daily as needed for muscle spasms. 15 tablet 0   dolutegravir ([name]) 50 MG tablet Take 1 tablet (50 mg total) by mouth daily. 30 tablet 1   emtricitabine-tenofovir AF (DESCOVY) 200-25 MG tablet Take 1 tablet by mouth daily. 30 tablet 1   FLOVENT HFA 110 MCG/ACT inhaler TAKE 1 PUFF BY MOUTH TWICE A DAY (Patient taking differently: Inhale 1 puff into the lungs 2 (two) times daily as needed (wheezing/shortness of breath).) 36 Inhaler 1   fluticasone (FLONASE) 50 MCG/ACT nasal spray Place 2 sprays into both nostrils daily. 16 g 6   gabapentin (NEURONTIN) 300 MG capsule Take 300 mg by mouth daily.     glucose blood (ONETOUCH VERIO) test strip USE AS INSTRUCTED TO TEST BLOOD SUGAR up to 3 times daily. 300 each 3   Lancets (ONETOUCH ULTRASOFT) lancets Use as instructed to test blood sugar daily 100 each 5   levothyroxine (SYNTHROID) 175 MCG tablet TAKE 1 TABLET  BY MOUTH EVERY DAY 30 tablet 7   losartan (COZAAR) 50 MG tablet Take 1 tablet (50 mg total) by mouth daily. for blood pressure. 90 tablet 3   metoCLOPramide (REGLAN) 5 MG tablet Take 1 tablet (5 mg total) by mouth every 6 (six) hours as needed for nausea. 20 tablet 0   minocycline (DYNACIN) 100 MG tablet Take 1 tablet (100 mg total) by mouth 2 (two) times daily. 60 tablet 11   omeprazole (PRILOSEC) 40 MG capsule Take 1 capsule (40 mg total) by mouth 2 (two) times daily before a meal. 180 capsule 0   ondansetron (ZOFRAN) 4 MG tablet Take 1 tablet (4 mg total) by mouth daily as needed for nausea or vomiting. 20 tablet 0   polyethylene glycol (MIRALAX) 17 g packet Take 17 g by mouth daily. 14 each 0    psyllium (METAMUCIL SMOOTH TEXTURE) 58.6 % powder Take 1 packet by mouth 3 (three) times daily. 283 g 12   rosuvastatin (CRESTOR) 10 MG tablet TAKE 1 TABLET BY MOUTH EVERY DAY FOR CHOLESTEROL (Patient taking differently: Take 10 mg by mouth daily. FOR CHOLESTEROL) 90 tablet 2   saxagliptin HCl (ONGLYZA) 2.5 MG TABS tablet Take 1 tablet (2.5 mg total) by mouth daily. for diabetes. 90 tablet 1   tamoxifen (NOLVADEX) 20 MG tablet Take 1 tablet (20 mg total) by mouth daily. 90 tablet 4   traMADol (ULTRAM) 50 MG tablet Take 1 tablet (50 mg total) by mouth every 6 (six) hours as needed for moderate pain. 20 tablet 0   No current facility-administered medications for this visit.    Allergies  Allergen Reactions   Genvoya [Elviteg-Cobic-Emtricit-Tenofaf] Hives   Lisinopril Cough   Tape Rash    Rash  Okay with tegaderm and paper tape   Aldactone [Spironolactone] Hives   Tegaderm Ag Mesh [Silver] Itching     REVIEW OF SYSTEMS:  [X]  denotes positive finding, [ ]  denotes negative finding Cardiac  Comments:  Chest pain or chest pressure:    Shortness of breath upon exertion:    Short of breath when lying flat:    Irregular heart rhythm:        Vascular    Pain in calf, thigh, or hip brought on by ambulation:    Pain in feet at night that wakes you up from your sleep:     Blood clot in your veins:    Leg swelling:         Pulmonary    Oxygen at home:    Productive cough:     Wheezing:         Neurologic    Sudden weakness in arms or legs:     Sudden numbness in arms or legs:     Sudden onset of difficulty speaking or slurred speech:    Temporary loss of vision in one eye:     Problems with dizziness:         Gastrointestinal    Blood in stool:     Vomited blood:         Genitourinary    Burning when urinating:     Blood in urine:        Psychiatric    Major depression:         Hematologic    Bleeding problems:    Problems with blood clotting too easily:        Skin     Rashes or ulcers:        Constitutional  Fever or chills:      PHYSICAL EXAMINATION:  Vitals:   03/31/23 1453  BP: 126/79  Pulse: 66  Resp: 20  Temp: 98.2 F (36.8 C)  SpO2: 98%  Weight: 186 lb (84.4 kg)  Height: 5\' 5"  (1.651 m)    General:  WDWN in NAD; vital signs documented above Gait: Not observed HENT: WNL, normocephalic Pulmonary: normal non-labored breathing , without wheezing Cardiac: regular HR Abdomen: soft, NT, no masses Skin: without rashes Vascular Exam/Pulses:  Right Left  Radial 2+ (normal) 2+ (normal)  Ulnar    Femoral    Popliteal    DP 2+ (normal) 2+ (normal)  PT     Extremities: without ischemic changes, without Gangrene , without cellulitis; without open wounds;  Musculoskeletal: no muscle wasting or atrophy  Neurologic: A&O X 3;  No focal weakness or paresthesias are detected Psychiatric:  The pt has Normal affect.   Non-Invasive Vascular Imaging:   Ridging demonstrates suitable superficial veins bilaterally for venous fistula creation    ASSESSMENT/PLAN: [name] is a 57 y.o. 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  She was stage V kidney disease prior to the dissection.  This pushed her into renal failure.  Overall I am very happy with how she is doing.  She was smiling today, and very appreciative of the care that she was given at [facility]. She was unable to make her appointment for repeat CT angio chest abdomen pelvis to evaluate her repair.  Discussed that this is very important as she still has the risk of aneurysmal degeneration. I asked that she make this appointment, and that I will call her with the results of the study.  From a dialysis standpoint, she appears to be leaning toward peritoneal dialysis.  At the time of my next phone call, we will discuss candidacy for AV versus peritoneal dialysis, specifically which  one she would like to pursue.    Victorino Sparrow, MD [facility] [phone]

## 2023-04-01 NOTE — Telephone Encounter (Signed)
Noted, refill sent to pharmacy. 

## 2023-04-07 ENCOUNTER — Other Ambulatory Visit: Payer: Self-pay

## 2023-04-07 DIAGNOSIS — Z95828 Presence of other vascular implants and grafts: Secondary | ICD-10-CM

## 2023-04-07 DIAGNOSIS — I7103 Dissection of thoracoabdominal aorta: Secondary | ICD-10-CM

## 2023-04-18 ENCOUNTER — Ambulatory Visit: Payer: Commercial Managed Care - HMO | Admitting: Infectious Diseases

## 2023-04-26 ENCOUNTER — Other Ambulatory Visit: Payer: Self-pay | Admitting: Primary Care

## 2023-04-26 DIAGNOSIS — M1A09X0 Idiopathic chronic gout, multiple sites, without tophus (tophi): Secondary | ICD-10-CM

## 2023-04-27 ENCOUNTER — Ambulatory Visit (INDEPENDENT_AMBULATORY_CARE_PROVIDER_SITE_OTHER): Payer: Medicare Other | Admitting: Primary Care

## 2023-04-27 ENCOUNTER — Encounter: Payer: Self-pay | Admitting: Primary Care

## 2023-04-27 VITALS — BP 130/64 | HR 72 | Temp 98.5°F | Ht 65.0 in | Wt 185.0 lb

## 2023-04-27 DIAGNOSIS — R490 Dysphonia: Secondary | ICD-10-CM | POA: Diagnosis not present

## 2023-04-27 DIAGNOSIS — J4530 Mild persistent asthma, uncomplicated: Secondary | ICD-10-CM

## 2023-04-27 DIAGNOSIS — K219 Gastro-esophageal reflux disease without esophagitis: Secondary | ICD-10-CM

## 2023-04-27 DIAGNOSIS — J309 Allergic rhinitis, unspecified: Secondary | ICD-10-CM | POA: Diagnosis not present

## 2023-04-27 MED ORDER — LEVOCETIRIZINE DIHYDROCHLORIDE 5 MG PO TABS
5.0000 mg | ORAL_TABLET | Freq: Every evening | ORAL | 3 refills | Status: DC
Start: 2023-04-27 — End: 2024-05-12

## 2023-04-27 MED ORDER — FLUTICASONE PROPIONATE HFA 110 MCG/ACT IN AERO
1.0000 | INHALATION_SPRAY | Freq: Two times a day (BID) | RESPIRATORY_TRACT | 1 refills | Status: DC
Start: 2023-04-27 — End: 2023-06-23

## 2023-04-27 MED ORDER — ALBUTEROL SULFATE HFA 108 (90 BASE) MCG/ACT IN AERS
2.0000 | INHALATION_SPRAY | Freq: Four times a day (QID) | RESPIRATORY_TRACT | 0 refills | Status: DC | PRN
Start: 2023-04-27 — End: 2023-09-06

## 2023-04-27 NOTE — Assessment & Plan Note (Signed)
Appears deteriorated, especially given the warmer weather.  Continue fluticasone 110 mcg, 1 to 2 puffs twice daily every day. Refills provided.  Refills provided for albuterol inhaler to use as needed.  She will update if no improvement.

## 2023-04-27 NOTE — Progress Notes (Signed)
Subjective:    Patient ID: [name], female    DOB: 1966-08-20, 57 y.o.   MRN: [id number]  Sore Throat  Associated symptoms include shortness of breath. Pertinent negatives include no congestion or coughing.    [name] is a very pleasant 57 y.o. female with a significant medical history including thyroid cancer, hypothyroidism, breast cancer, hypertension, aortic dissection, laryngopharyngeal reflux, HIV, sarcoidosis, GAD, depression, CKD stage IV requiring dialysis, voice hoarseness who presents today to discuss hoarseness to her voice.  Symptoms began about 3 weeks ago with voice hoarseness. She's also noticed shortness of breath with mild exertion or talking a lot, post nasal drip.   She resumed her Flovent 110 mcg inhaler BID when symptoms began 3 weeks ago,has noticed some improvement in her shortness of breath. Her inhaler expired in 2021. She's been compliant to her Zyrtec 10 mg daily, hasn't noticed improvement in post nasal drip. She also resumed her famotidine about 1 week ago without much improvement.   Following with GI for voice hoarseness and reflux, currently managed on omeprazole 40 mg, last office visit was in March 2024, omeprazole was increased to 40 mg twice daily at the time.  It was also recommended she undergo barium swallow and EGD with possible dilation. She hasn't had this scheduled as she was hospitalized for aortic dissection.   Wt Readings from Last 3 Encounters:  04/27/23 185 lb (83.9 kg)  03/31/23 186 lb (84.4 kg)  03/07/23 190 lb (86.2 kg)     Review of Systems  Constitutional:  Negative for chills and fever.  HENT:  Positive for postnasal drip and voice change. Negative for congestion.   Respiratory:  Positive for shortness of breath. Negative for cough.          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  Genes Analyzed (43 total): APC*, ATM*, AXIN2, BARD1, BMPR1A, BRCA1*, BRCA2*, BRIP1*, CDH1*, CDK4, CDKN2A, CHEK2*, DICER1, GALNT12, HOXB13, MEN1, MLH1*, MRE11A, MSH2*, MSH3, MSH6*, MUTYH*, NBN, NF1*, NTHL1, PALB2*, PMS2*, POLD1, POLE, PTEN*, RAD50, RAD51C*, RAD51D*, RET, SDHB, SDHD, SMAD4, SMARCA4, STK11 and TP53* (sequencing and deletion/duplication); EGFR (s   GERD 07/24/2006   GRAVE'S DISEASE 01/01/2008   History of hidradenitis suppurativa    History of kidney stones    History of thrush    HIV DISEASE 07/24/2006   dx March 05   Hyperlipidemia    HYPERTENSION 07/24/2006   Hyperthyroidism 08/2006   Grave's Disease -diffuse radiotracer uptake 08/25/06 Thyroid scan-Cold nodule to R lower lobe of thyrorid   Ileus (HCC) 02/14/2023   Menometrorrhagia    hx of   Nephrolithiasis    Panniculitis 05/12/2020  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HCC)    METASTATIC PAPILLARY THYROID CARCINOMA per 01/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 Completed 2 yrs Prednisone after lung bx confirmation. No symptoms since then.   Sebaceous cyst 04/21/2020   Suppurative hidradenitis    Thyroid cancer (HCC)    THYROID NODULE, RIGHT 02/08/2007    Social History   Socioeconomic History   Marital status: Single    Spouse name: Not on file   Number of children: 1   Years of education: Not on file   Highest education level: Not on file  Occupational History   Occupation: Investment banker, corporate  Tobacco Use   Smoking status: Former    Current packs/day: 0.00    Average packs/day: 0.5 packs/day for 15.0 years (7.5 ttl pk-yrs)    Types: Cigarettes    Start date: 04/12/2017    Quit date: 2019    Years since quitting: 5.5   Smokeless tobacco: Never  Vaping Use   Vaping status: Never Used  Substance and Sexual Activity   Alcohol use: Yes    Alcohol/week: 0.0 standard drinks of alcohol    Comment: social   Drug use: No   Sexual activity: Not Currently    Birth control/protection: Post-menopausal    Comment: declined condoms  Other Topics Concern   Not on file  Social History Narrative   Right Handed    Lives in a two story home   Social Determinants of Health   Financial Resource Strain: Medium Risk (05/07/2022)   Received from [facility]   Overall Financial Resource Strain (CARDIA)    Difficulty of Paying Living Expenses: Somewhat hard  Food Insecurity: No Food Insecurity (02/03/2023)   Hunger Vital Sign    Worried About Running Out of Food in the Last Year: Never true    Ran Out of Food in the Last Year: Never true  Transportation Needs: No Transportation Needs (02/03/2023)   PRAPARE - Administrator, Civil Service (Medical): No    Lack of Transportation (Non-Medical): No  Physical Activity: Not on file  Stress: Not on file  Social Connections: Not on file  Intimate Partner Violence:  Not At Risk (02/03/2023)   Humiliation, Afraid, Rape, and Kick questionnaire    Fear of Current or Ex-Partner: No    Emotionally Abused: No    Physically Abused: No    Sexually Abused: No    Past Surgical History:  Procedure Laterality Date   APPLICATION OF WOUND VAC N/A 01/20/2021   Procedure: APPLICATION OF WOUND VAC;  Surgeon: Peggye Form, DO;  Location: [facility];  Service: Plastics;  Laterality: N/A;   BREAST EXCISIONAL BIOPSY Right 04/26/2018   right axilla negative   BREAST EXCISIONAL BIOPSY Left 04/26/2018   left axilla negative   BREAST LUMPECTOMY Right 10/03/2018   malignant   BREAST LUMPECTOMY WITH RADIOACTIVE SEED AND SENTINEL LYMPH NODE BIOPSY Right 10/03/2018   Procedure: RIGHT BREAST LUMPECTOMY WITH RADIOACTIVE SEED AND SENTINEL LYMPH NODE MAPPING;  Surgeon: Harriette Bouillon, MD;  Location: [facility];  Service: General;  Laterality: Right;   BREAST SURGERY  1997   Breast Reduction    CYSTOSCOPY W/ URETERAL STENT REMOVAL  11/09/2012   Procedure: CYSTOSCOPY WITH STENT REMOVAL;  Surgeon: Sebastian Ache, MD;  Location: [facility];  Service: Urology;  Laterality: Right;   CYSTOSCOPY WITH RETROGRADE PYELOGRAM, URETEROSCOPY AND STENT PLACEMENT  11/09/2012   Procedure: CYSTOSCOPY WITH RETROGRADE PYELOGRAM, URETEROSCOPY AND STENT PLACEMENT;  Surgeon: Sebastian Ache, MD;  Location: [facility];  Service: Urology;  Laterality: Left;  LEFT URETEROSCOPY, STONE MANIPULATION, left STENT exchange    CYSTOSCOPY WITH STENT PLACEMENT  10/02/2012   Procedure: CYSTOSCOPY WITH STENT PLACEMENT;  Surgeon: Sebastian Ache, MD;  Location: [facility];  Service: Urology;  Laterality: Left;   DEBRIDEMENT AND CLOSURE WOUND N/A 01/20/2021   Procedure: Excision of abdominal wound with closure;  Surgeon: Peggye Form, DO;  Location: [facility];  Service: Plastics;  Laterality: N/A;   DILATION AND CURETTAGE OF UTERUS  11/2002   s/p for 1st trimester nonviable pregnancy   EYE  SURGERY     sty under eyelid   INCISE AND DRAIN ABCESS  08/2002   s/p I &D for righ inframmary fold hidradenitis   INCISION AND DRAINAGE PERITONSILLAR ABSCESS  12/2001   IR CV LINE INJECTION  06/07/2018   IR FLUORO GUIDE CV LINE RIGHT  01/30/2023   IR IMAGING GUIDED PORT INSERTION  06/20/2018   IR REMOVAL TUN ACCESS W/ PORT W/O FL MOD SED  06/20/2018   IR US GUIDE VASC ACCESS RIGHT  01/30/2023   IRRIGATION AND DEBRIDEMENT ABSCESS  01/31/2012   Procedure: IRRIGATION AND DEBRIDEMENT ABSCESS;  Surgeon: Kandis Cocking, MD;  Location: [facility];  Service: General;  Laterality: Right;  right breast and axilla    NEPHROLITHOTOMY  10/02/2012   Procedure: NEPHROLITHOTOMY PERCUTANEOUS;  Surgeon: Sebastian Ache, MD;  Location: [facility];  Service: Urology;  Laterality: Right;  First Stage Percutaneous Nephrolithotomy with Surgeon Access, Left Ureteral Stent     NEPHROLITHOTOMY  10/04/2012   Procedure: NEPHROLITHOTOMY PERCUTANEOUS SECOND LOOK;  Surgeon: Sebastian Ache, MD;  Location: [facility];  Service: Urology;  Laterality: Right;      NEPHROLITHOTOMY  10/08/2012   Procedure: NEPHROLITHOTOMY PERCUTANEOUS;  Surgeon: Sebastian Ache, MD;  Location: [facility];  Service: Urology;  Laterality: Right;  THIRD STAGE, nephrostomy tube exchange x 2   NEPHROLITHOTOMY  10/11/2012   Procedure: NEPHROLITHOTOMY PERCUTANEOUS SECOND LOOK;  Surgeon: Sebastian Ache, MD;  Location: [facility];  Service: Urology;  Laterality: Right;  RIGHT 4 STAGE PERCUTANOUS NEPHROLITHOTOMY, right URETEROSCOPY WITH HOLMIUM LASER    PANNICULECTOMY N/A 12/21/2020   Procedure: PANNICULECTOMY;  Surgeon: Peggye Form, DO;  Location: [facility];  Service: Plastics;  Laterality: N/A;   PERCUTANEOUS NEPHROSTOLITHOTOMY  04/2022   PORT-A-CATH REMOVAL N/A 07/16/2020   Procedure: REMOVAL PORT-A-CATH;  Surgeon: Harriette Bouillon, MD;  Location: [facility];  Service: General;  Laterality: N/A;   PORTACATH PLACEMENT Left 05/17/2018   Procedure:  INSERTION PORT-A-CATH;  Surgeon: Abigail Miyamoto, MD;  Location: [facility];  Service: General;  Laterality: Left;   RADICAL NECK DISSECTION  04/12/2017   limited/notes 04/12/2017   RADICAL NECK DISSECTION N/A 04/12/2017   Procedure: RADICAL NECK DISSECTION;  Surgeon: Christia Reading, MD;  Location: [facility] OR;  Service: ENT;  Laterality: N/A;  limited neck dissection 2 hours total   REDUCTION MAMMAPLASTY Bilateral 1998   RIGHT/LEFT HEART CATH AND CORONARY ANGIOGRAPHY N/A 03/12/2020   Procedure: RIGHT/LEFT HEART CATH AND CORONARY ANGIOGRAPHY;  Surgeon: Dolores Patty, MD;  Location: [facility] INVAS[facility] CV LAB;  Service: Cardiovascular;  Laterality: N/A;   [place]  1994   THORACIC AORTIC ENDOVASCULAR STENT GRAFT N/A 02/02/2023   Procedure: THORACIC AORTIC ENDOVASCULAR STENT GRAFT;  Surgeon: Victorino Sparrow, MD;  Location: [facility] OR;  Service: Vascular;  Laterality: N/A;   THYROIDECTOMY  04/12/2017   completion/notes 04/12/2017   THYROIDECTOMY N/A 04/12/2017   Procedure: THYROIDECTOMY;  Surgeon: Christia Reading, MD;  Location: [facility] OR;  Service: ENT;  Laterality: N/A;  Completion Thyroidectomy   TOTAL THYROIDECTOMY  2010    Family History  Problem Relation Age of Onset   Hypertension Mother    Cancer Mother        laryngeal   Heart disease Mother        stent   Pancreatic cancer Father    Hypertension Father    Lung cancer Father 1       hx smoking   Hypertension Sister    Hypertension Sister    Hypertension Brother    Breast cancer Maternal Aunt 86   Breast cancer Maternal Aunt        dx 60+   Cancer Maternal Uncle        Lung CA   Breast cancer Paternal Aunt 63   Breast cancer Paternal Aunt        dx 32's   Breast cancer Paternal Aunt        dx 50's   Prostate cancer Paternal Uncle    Prostate cancer Paternal Uncle    Lung cancer Paternal Uncle    Breast cancer Cousin 42   Breast cancer Cousin        dx <50   Breast cancer Cousin        dx <50   Breast cancer Cousin         dx <50   Heart disease Other    Hypertension Other    Stroke Other        Grandparent   Kidney disease Other        Grandparent   Diabetes Other        FH of Diabetes   Colon cancer Neg Hx    Esophageal cancer Neg Hx    Rectal cancer Neg Hx    Stomach cancer Neg Hx     Allergies  Allergen Reactions   Genvoya [Elviteg-Cobic-Emtricit-Tenofaf] Hives   Lisinopril Cough   Tape Rash    Rash  Okay with tegaderm and paper tape   Aldactone [Spironolactone] Hives   Tegaderm Ag Mesh [Silver] Itching    Current Outpatient Medications on File Prior to Visit  Medication Sig Dispense Refill   allopurinol (ZYLOPRIM) 100 MG tablet TAKE 1 TABLET BY MOUTH EVERY DAY FOR GOUT PREVENTION 90 tablet 2   Blood Glucose Monitoring Suppl (FREESTYLE LITE) w/Device KIT Inject 1 each into the skin daily. 1 kit 0   carvedilol (COREG) 25 MG tablet Take 1 tablet (25 mg total) by mouth 2 (two) times daily. 180 tablet 3   dolutegravir ([name]) 50 MG tablet Take 1 tablet (50 mg total) by mouth daily. 30 tablet 1   emtricitabine-tenofovir AF (DESCOVY) 200-25 MG tablet Take 1 tablet by mouth daily. 30 tablet 1   gabapentin (NEURONTIN) 300 MG capsule Take 300 mg by mouth daily.     glucose blood (ONETOUCH VERIO) test strip USE AS INSTRUCTED TO TEST BLOOD SUGAR up to 3 times daily. 300 each 3   Lancets (ONETOUCH ULTRASOFT) lancets  Use as instructed to test blood sugar daily 100 each 5   levothyroxine (SYNTHROID) 175 MCG tablet TAKE 1 TABLET BY MOUTH EVERY DAY 30 tablet 7   minocycline (DYNACIN) 100 MG tablet Take 1 tablet (100 mg total) by mouth 2 (two) times daily. 60 tablet 11   omeprazole (PRILOSEC) 40 MG capsule Take 1 capsule (40 mg total) by mouth 2 (two) times daily before a meal. 180 capsule 0   rosuvastatin (CRESTOR) 10 MG tablet TAKE 1 TABLET BY MOUTH EVERY DAY FOR CHOLESTEROL (Patient taking differently: Take 10 mg by mouth daily. FOR CHOLESTEROL) 90 tablet 2   saxagliptin HCl (ONGLYZA) 2.5 MG TABS  tablet Take 1 tablet (2.5 mg total) by mouth daily. for diabetes. 90 tablet 1   tamoxifen (NOLVADEX) 20 MG tablet Take 1 tablet (20 mg total) by mouth daily. 90 tablet 4   traMADol (ULTRAM) 50 MG tablet Take 1 tablet (50 mg total) by mouth every 6 (six) hours as needed for moderate pain. 20 tablet 0   acetaminophen (TYLENOL) 500 MG tablet Take 1,000 mg by mouth every 6 (six) hours as needed for mild pain. (Patient not taking: Reported on 04/27/2023)     amLODipine (NORVASC) 5 MG tablet Take 1 tablet (5 mg total) by mouth daily. for blood pressure. 90 tablet 3   cyclobenzaprine (FLEXERIL) 5 MG tablet Take 1 tablet (5 mg total) by mouth 3 (three) times daily as needed for muscle spasms. (Patient not taking: Reported on 04/27/2023) 15 tablet 0   fluticasone (FLONASE) 50 MCG/ACT nasal spray Place 2 sprays into both nostrils daily. (Patient not taking: Reported on 04/27/2023) 16 g 6   losartan (COZAAR) 50 MG tablet Take 1 tablet (50 mg total) by mouth daily. for blood pressure. 90 tablet 3   metoCLOPramide (REGLAN) 5 MG tablet Take 1 tablet (5 mg total) by mouth every 6 (six) hours as needed for nausea. (Patient not taking: Reported on 04/27/2023) 20 tablet 0   ondansetron (ZOFRAN) 4 MG tablet Take 1 tablet (4 mg total) by mouth daily as needed for nausea or vomiting. (Patient not taking: Reported on 04/27/2023) 20 tablet 0   polyethylene glycol (MIRALAX) 17 g packet Take 17 g by mouth daily. 14 each 0   psyllium (METAMUCIL SMOOTH TEXTURE) 58.6 % powder Take 1 packet by mouth 3 (three) times daily. 283 g 12   No current facility-administered medications on file prior to visit.    BP 130/64   Pulse 72   Temp 98.5 F (36.9 C) (Temporal)   Ht 5\' 5"  (1.651 m)   Wt 185 lb (83.9 kg)   LMP 03/31/2014 (LMP Unknown)   SpO2 98%   BMI 30.79 kg/m  Objective:   Physical Exam Constitutional:      Appearance: She is not ill-appearing.  HENT:     Mouth/Throat:     Mouth: Mucous membranes are moist.      Pharynx: No posterior oropharyngeal erythema.  Cardiovascular:     Rate and Rhythm: Normal rate and regular rhythm.  Pulmonary:     Effort: Pulmonary effort is normal.     Breath sounds: Normal breath sounds. No wheezing or rhonchi.  Musculoskeletal:     Cervical back: Neck supple.  Skin:    General: Skin is warm and dry.           Assessment & Plan:  Allergic rhinitis, unspecified seasonality, unspecified trigger Assessment & Plan: Voice hoarseness could be secondary to postnasal drip.  Stop Zyrtec 10  mg daily. Stay Xyzal 5 mg at bedtime.  She will update.  Orders: -     Levocetirizine Dihydrochloride; Take 1 tablet (5 mg total) by mouth every evening. For allergies  Dispense: 90 tablet; Refill: 3  Mild persistent asthma without complication Assessment & Plan: Appears deteriorated, especially given the warmer weather.  Continue fluticasone 110 mcg, 1 to 2 puffs twice daily every day. Refills provided.  Refills provided for albuterol inhaler to use as needed.  She will update if no improvement.  Orders: -     Albuterol Sulfate HFA; Inhale 2 puffs into the lungs every 6 (six) hours as needed for wheezing or shortness of breath.  Dispense: 8 g; Refill: 0 -     Fluticasone Propionate HFA; Inhale 1 puff into the lungs 2 (two) times daily. TAKE 1 PUFF BY MOUTH TWICE A DAY Strength: 110 MCG/ACT  Dispense: 3 each; Refill: 1  Laryngopharyngeal reflux (LPR) Assessment & Plan: Overall appears controlled, however she does need to complete a barium swallow study and EGD. She will contact GI.  Continue omeprazole 40 mg twice daily   Voice hoarseness Assessment & Plan: Differentials include postnasal drip/allergies versus reflux.  Continue omeprazole 40 mg twice daily. She will contact GI to schedule the EGD and barium swallow.  Stop Zyrtec 10 mg, start Xyzal 5 mg at bedtime.  Continue Flovent inhaler as prescribed.         Doreene Nest, NP

## 2023-04-27 NOTE — Patient Instructions (Signed)
Stop taking Zyrtec 10 mg for allergies.  Start taking levocetirizine (Xyzal) 5 mg at bedtime for drainage and allergies.  Use the fluticasone (Flovent) inhaler for asthma.  Inhale 1 to 2 puffs by mouth twice daily every day.  Use the albuterol (rescue inhaler) every 6 hours as needed for increased shortness of breath/wheezing.  Contact [facility] in [place] to arrange for your endoscopy and barium swallow.  You saw [name] in March 2024.  It was a pleasure to see you today!

## 2023-04-27 NOTE — Assessment & Plan Note (Signed)
Voice hoarseness could be secondary to postnasal drip.  Stop Zyrtec 10 mg daily. Stay Xyzal 5 mg at bedtime.  She will update.

## 2023-04-27 NOTE — Assessment & Plan Note (Signed)
Overall appears controlled, however she does need to complete a barium swallow study and EGD. She will contact GI.  Continue omeprazole 40 mg twice daily

## 2023-04-27 NOTE — Assessment & Plan Note (Signed)
Differentials include postnasal drip/allergies versus reflux.  Continue omeprazole 40 mg twice daily. She will contact GI to schedule the EGD and barium swallow.  Stop Zyrtec 10 mg, start Xyzal 5 mg at bedtime.  Continue Flovent inhaler as prescribed.

## 2023-04-28 ENCOUNTER — Telehealth: Payer: Self-pay | Admitting: Primary Care

## 2023-04-28 DIAGNOSIS — N898 Other specified noninflammatory disorders of vagina: Secondary | ICD-10-CM

## 2023-04-28 MED ORDER — FLUCONAZOLE 150 MG PO TABS
150.0000 mg | ORAL_TABLET | Freq: Once | ORAL | 0 refills | Status: AC
Start: 2023-04-28 — End: 2023-04-28

## 2023-04-28 NOTE — Telephone Encounter (Signed)
Patient was seen on 04/27/2023 for hoarseness,she called in today stating that she forgot to tell [name] that she has another yeast infection,ad would like to know if she can have something called in for it?   [id number] [place], [address] - [address] Phone: [phone]  Fax: [phone]

## 2023-04-28 NOTE — Telephone Encounter (Signed)
Noted. Prescription for fluconazole sent to pharmacy.

## 2023-05-04 ENCOUNTER — Ambulatory Visit (HOSPITAL_COMMUNITY)
Admission: RE | Admit: 2023-05-04 | Discharge: 2023-05-04 | Disposition: A | Discharge status: Home / Self Care | Payer: Medicare Other | Source: Ambulatory Visit | Attending: Vascular Surgery | Admitting: Vascular Surgery

## 2023-05-04 DIAGNOSIS — Z95828 Presence of other vascular implants and grafts: Secondary | ICD-10-CM | POA: Insufficient documentation

## 2023-05-04 DIAGNOSIS — I7103 Dissection of thoracoabdominal aorta: Secondary | ICD-10-CM | POA: Insufficient documentation

## 2023-05-04 LAB — POCT I-STAT CREATININE: Creatinine, Ser: 4.2 mg/dL — ABNORMAL HIGH (ref 0.44–1.00)

## 2023-05-04 MED ORDER — IOHEXOL 300 MG/ML  SOLN: 100.0000 mL | Freq: Once | INTRAMUSCULAR | Status: DC | PRN

## 2023-05-04 MED ORDER — IOHEXOL 350 MG/ML SOLN
100.0000 mL | Freq: Once | INTRAVENOUS | Status: AC | PRN
  Administered 2023-05-04: 100 mL via INTRAVENOUS

## 2023-05-12 ENCOUNTER — Ambulatory Visit: Payer: Medicare Other | Admitting: Vascular Surgery

## 2023-05-12 DIAGNOSIS — I7103 Dissection of thoracoabdominal aorta: Secondary | ICD-10-CM | POA: Diagnosis not present

## 2023-05-12 DIAGNOSIS — N186 End stage renal disease: Secondary | ICD-10-CM

## 2023-05-12 DIAGNOSIS — Z95828 Presence of other vascular implants and grafts: Secondary | ICD-10-CM | POA: Diagnosis not present

## 2023-05-12 NOTE — Progress Notes (Signed)
Virtual Visit via Telephone Note   I connected with Murle Hereford Cropley on 05/12/2023 using the [email or web address] by telephone and verified that I was speaking with the correct person using two identifiers. Patient was located at her daughters home and accompanied by her daughter. I am located at the [address] office.   The limitations of evaluation and management by telemedicine and the availability of in person appointments have been previously discussed with the patient and are documented in the patients chart. The patient expressed understanding and consented to proceed.    History of Present Illness: [name] is a 57 y.o. 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  At her last visit, Darrie was asked to undergo CT angio chest abdomen pelvis to assess for interval changes and recent TEVAR status post TBAD.  On exam, she was doing well.  She noted no chest pain, back pain, shortness of breath.  She feels as though she is recovering slowly, but is moving in the right direction.  She has noticed some hoarseness, that has been present since surgery.  She continues to ambulate more and more every day.  Denies lower extremity weakness, claudication, ischemic rest pain, tissue loss.   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  Genes Analyzed (43 total): APC*, ATM*, AXIN2, BARD1, BMPR1A, BRCA1*, BRCA2*, BRIP1*, CDH1*, CDK4, CDKN2A, CHEK2*, DICER1, GALNT12, HOXB13, MEN1, MLH1*, MRE11A, MSH2*, MSH3, MSH6*, MUTYH*, NBN, NF1*, NTHL1, PALB2*, PMS2*, POLD1, POLE, PTEN*, RAD50, RAD51C*, RAD51D*, RET, SDHB, SDHD, SMAD4, SMARCA4, STK11 and TP53* (sequencing and deletion/duplication); EGFR (s   GERD 07/24/2006   GRAVE'S DISEASE 01/01/2008   History of hidradenitis suppurativa    History of kidney stones    History of thrush    HIV DISEASE 07/24/2006   dx March 05   Hyperlipidemia    HYPERTENSION 07/24/2006   Hyperthyroidism 08/2006   Grave's Disease -diffuse radiotracer uptake 08/25/06 Thyroid scan-Cold nodule to R lower lobe of thyrorid   Ileus (HCC) 02/14/2023   Menometrorrhagia    hx of   Nephrolithiasis    Panniculitis 05/12/2020   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HCC)    METASTATIC PAPILLARY THYROID CARCINOMA per 01/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 Completed 2 yrs Prednisone after lung bx confirmation. No symptoms since then.   Sebaceous cyst 04/21/2020   Suppurative hidradenitis    Thyroid cancer (HCC)    THYROID NODULE, RIGHT 02/08/2007    Past Surgical History:  Procedure Laterality Date   APPLICATION OF WOUND VAC N/A 01/20/2021   Procedure: APPLICATION OF WOUND VAC;  Surgeon: Peggye Form, DO;  Location: [facility];  Service: Plastics;  Laterality: N/A;   BREAST EXCISIONAL BIOPSY Right 04/26/2018   right axilla negative   BREAST EXCISIONAL BIOPSY Left 04/26/2018   left axilla negative   BREAST LUMPECTOMY Right 10/03/2018   malignant   BREAST LUMPECTOMY WITH RADIOACTIVE SEED AND SENTINEL LYMPH NODE BIOPSY Right 10/03/2018   Procedure: RIGHT BREAST LUMPECTOMY WITH RADIOACTIVE SEED AND SENTINEL LYMPH NODE MAPPING;  Surgeon: Harriette Bouillon, MD;  [facility];  Service: General;  Laterality: Right;   BREAST SURGERY  1997   Breast Reduction    CYSTOSCOPY W/ URETERAL STENT REMOVAL  11/09/2012   Procedure: CYSTOSCOPY WITH STENT REMOVAL;  Surgeon: Sebastian Ache, MD;  [facility];  Service: Urology;  Laterality: Right;   CYSTOSCOPY WITH RETROGRADE PYELOGRAM, URETEROSCOPY AND STENT PLACEMENT  11/09/2012   Procedure: CYSTOSCOPY WITH RETROGRADE PYELOGRAM, URETEROSCOPY AND STENT PLACEMENT;  Surgeon: Sebastian Ache, MD;  [facility];  Service: Urology;  Laterality: Left;  LEFT URETEROSCOPY, STONE MANIPULATION, left STENT exchange    CYSTOSCOPY WITH STENT PLACEMENT  10/02/2012   Procedure: CYSTOSCOPY WITH STENT PLACEMENT;  Surgeon: Sebastian Ache, MD;  [facility];  Service: Urology;  Laterality: Left;   DEBRIDEMENT AND CLOSURE WOUND N/A 01/20/2021   Procedure: Excision of abdominal wound with closure;  Surgeon: Peggye Form, DO;  Location: [facility];  Service: Plastics;  Laterality: N/A;   DILATION AND CURETTAGE OF UTERUS  11/2002   s/p for 1st trimester nonviable pregnancy   EYE SURGERY     sty under eyelid   INCISE AND DRAIN ABCESS  08/2002   s/p I &D for righ inframmary fold hidradenitis   INCISION AND DRAINAGE PERITONSILLAR ABSCESS  12/2001   IR CV LINE INJECTION  06/07/2018   IR FLUORO GUIDE CV LINE RIGHT  01/30/2023   IR IMAGING GUIDED PORT INSERTION  06/20/2018   IR REMOVAL TUN ACCESS W/ PORT W/O FL MOD SED  06/20/2018   IR US GUIDE VASC ACCESS RIGHT   01/30/2023   IRRIGATION AND DEBRIDEMENT ABSCESS  01/31/2012   Procedure: IRRIGATION AND DEBRIDEMENT ABSCESS;  Surgeon: Kandis Cocking, MD;  [facility];  Service: General;  Laterality: Right;  right breast and axilla    NEPHROLITHOTOMY  10/02/2012   Procedure: NEPHROLITHOTOMY PERCUTANEOUS;  Surgeon: Sebastian Ache, MD;  [facility];  Service: Urology;  Laterality: Right;  First Stage Percutaneous Nephrolithotomy with Surgeon Access, Left Ureteral Stent     NEPHROLITHOTOMY  10/04/2012   Procedure: NEPHROLITHOTOMY PERCUTANEOUS SECOND LOOK;  Surgeon: Sebastian Ache, MD;  [facility];  Service: Urology;  Laterality: Right;      NEPHROLITHOTOMY  10/08/2012   Procedure: NEPHROLITHOTOMY PERCUTANEOUS;  Surgeon: Sebastian Ache, MD;  [facility];  Service: Urology;  Laterality: Right;  THIRD STAGE, nephrostomy tube exchange x 2   NEPHROLITHOTOMY  10/11/2012   Procedure: NEPHROLITHOTOMY PERCUTANEOUS SECOND LOOK;  Surgeon: Sebastian Ache, MD;  [facility];  Service: Urology;  Laterality: Right;  RIGHT 4 STAGE PERCUTANOUS NEPHROLITHOTOMY, right URETEROSCOPY WITH HOLMIUM LASER    PANNICULECTOMY N/A 12/21/2020  Procedure: PANNICULECTOMY;  Surgeon: Peggye Form, DO;  [facility];  Service: Plastics;  Laterality: N/A;   PERCUTANEOUS NEPHROSTOLITHOTOMY  04/2022   PORT-A-CATH REMOVAL N/A 07/16/2020   Procedure: REMOVAL PORT-A-CATH;  Surgeon: Harriette Bouillon, MD;  Location: [facility];  Service: General;  Laterality: N/A;   PORTACATH PLACEMENT Left 05/17/2018   Procedure: INSERTION PORT-A-CATH;  Surgeon: Abigail Miyamoto, MD;  Location:  SUR[facility]ERY C[facility];  Service: General;  Laterality: Left;   RADICAL NECK DISSECTION  04/12/2017   limited/notes 04/12/2017   RADICAL NECK DISSECTION N/A 04/12/2017   Procedure: RADICAL NECK DISSECTION;  Surgeon: Christia Reading, MD;  Location: [facility];  Service: ENT;  Laterality: N/A;  limited neck dissection 2 hours total    REDUCTION MAMMAPLASTY Bilateral 1998   RIGHT/LEFT HEART CATH AND CORONARY ANGIOGRAPHY N/A 03/12/2020   Procedure: RIGHT/LEFT HEART CATH AND CORONARY ANGIOGRAPHY;  Surgeon: Dolores Patty, MD;  [facility];  Service: Cardiovascular;  Laterality: N/A;   [place]  1994   THORACIC AORTIC ENDOVASCULAR STENT GRAFT N/A 02/02/2023   Procedure: THORACIC AORTIC ENDOVASCULAR STENT GRAFT;  Surgeon: Victorino Sparrow, MD;  Location: [facility];  Service: Vascular;  Laterality: N/A;   THYROIDECTOMY  04/12/2017   completion/notes 04/12/2017   THYROIDECTOMY N/A 04/12/2017   Procedure: THYROIDECTOMY;  Surgeon: Christia Reading, MD;  [facility];  Service: ENT;  Laterality: N/A;  Completion Thyroidectomy   TOTAL THYROIDECTOMY  2010    No outpatient medications have been marked as taking for the 05/12/23 encounter (Office Visit) with Victorino Sparrow, MD.    12 system ROS was negative unless otherwise noted in HPI   Observations/Objective:  CT angio reveals slipping of the TEVAR, with aneurysmal degeneration proximally.  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  There is also bird beaking of the graft.  Assessment and Plan:  Patient is status post TEVAR for symptomatic TBAD.  Interval imaging today demonstrates significant degeneration over the last 2 months at the proximal aspect of the graft.  The graft has slipped, and now appears to be in zone 3.  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  Maedean will need surgical revision.  I think this can be repaired using a minimally invasive method of extending the graft to zone 2 with laser fenestration of the left subclavian artery.  Being that this is a large case, and we were discussing this over the phone, I asked for Lamonda to come to the office to review her imaging as well as to discuss the  risks and benefits of surgery.  My plan is to see her in the next 2 weeks.  She remains asymptomatic at this time.  She was asked to immediately present to the emergency department should any new onset chest pain or back pain occur.  Total time of phone call including imaging and documentation over 30 minutes.  Signed, Victorino Sparrow [facility] [place] Office: [phone]  05/12/2023, 1:40 PM

## 2023-05-19 ENCOUNTER — Ambulatory Visit (INDEPENDENT_AMBULATORY_CARE_PROVIDER_SITE_OTHER): Payer: Medicare HMO | Admitting: Vascular Surgery

## 2023-05-19 ENCOUNTER — Encounter: Payer: Self-pay | Admitting: Vascular Surgery

## 2023-05-19 ENCOUNTER — Other Ambulatory Visit: Payer: Self-pay | Admitting: Primary Care

## 2023-05-19 VITALS — BP 136/84 | HR 71 | Temp 98.0°F | Resp 20 | Ht 65.0 in | Wt 182.0 lb

## 2023-05-19 DIAGNOSIS — N2581 Secondary hyperparathyroidism of renal origin: Secondary | ICD-10-CM | POA: Diagnosis not present

## 2023-05-19 DIAGNOSIS — I7103 Dissection of thoracoabdominal aorta: Secondary | ICD-10-CM

## 2023-05-19 DIAGNOSIS — Z992 Dependence on renal dialysis: Secondary | ICD-10-CM | POA: Diagnosis not present

## 2023-05-19 DIAGNOSIS — J4530 Mild persistent asthma, uncomplicated: Secondary | ICD-10-CM

## 2023-05-19 DIAGNOSIS — N186 End stage renal disease: Secondary | ICD-10-CM | POA: Diagnosis not present

## 2023-05-19 NOTE — Telephone Encounter (Signed)
Unable to reach patient. Left voicemail to return call to our office.   

## 2023-05-19 NOTE — Telephone Encounter (Signed)
Okay, I will disregard this pharmacy request.

## 2023-05-19 NOTE — Telephone Encounter (Signed)
Patient called back in, she states she did pick up the Flovent inhaler and has started to use it. She is unsure if it was covered by insurance because her daughter picked it up for her.

## 2023-05-19 NOTE — Telephone Encounter (Signed)
Please call patient:  Was she able to pick up her Flovent (fluticasone) inhaler for asthma that I sent a few weeks ago?   Was it covered by her insurance?

## 2023-05-22 DIAGNOSIS — Z992 Dependence on renal dialysis: Secondary | ICD-10-CM | POA: Diagnosis not present

## 2023-05-22 DIAGNOSIS — N186 End stage renal disease: Secondary | ICD-10-CM | POA: Diagnosis not present

## 2023-05-22 DIAGNOSIS — N2581 Secondary hyperparathyroidism of renal origin: Secondary | ICD-10-CM | POA: Diagnosis not present

## 2023-05-23 NOTE — Progress Notes (Signed)
Office note   History of Present Illness: [name] is a 57 y.o. 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  At her last visit, [name] was asked to undergo CT angio chest abdomen pelvis to assess for interval changes and recent TEVAR status post TBAD.  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  On exam today, [name] was doing well, accompanied by her daughter.  She notes no chest pain, back pain, shortness of breath.  She feels like she is moving the right direction.  She has been relatively inactive, mainly staying at home.  She notes some shortness of breath with climbing a flight of stairs, but does note that is related to not doing it very often. Denies lower extremity weakness, claudication, ischemic rest pain, tissue loss.  Has waxing and waning left flank pain.   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  Genes Analyzed (43 total):  APC*, ATM*, AXIN2, BARD1, BMPR1A, BRCA1*, BRCA2*, BRIP1*, CDH1*, CDK4, CDKN2A, CHEK2*, DICER1, GALNT12, HOXB13, MEN1, MLH1*, MRE11A, MSH2*, MSH3, MSH6*, MUTYH*, NBN, NF1*, NTHL1, PALB2*, PMS2*, POLD1, POLE, PTEN*, RAD50, RAD51C*, RAD51D*, RET, SDHB, SDHD, SMAD4, SMARCA4, STK11 and TP53* (sequencing and deletion/duplication); EGFR (s   GERD 07/24/2006   GRAVE'S DISEASE 01/01/2008   History of hidradenitis suppurativa    History of kidney stones    History of thrush    HIV DISEASE 07/24/2006   dx March 05   Hyperlipidemia    HYPERTENSION 07/24/2006   Hyperthyroidism 08/2006   Grave's Disease -diffuse radiotracer uptake 08/25/06 Thyroid scan-Cold nodule to R lower lobe of thyrorid   Ileus (HCC) 02/14/2023   Menometrorrhagia    hx of   Nephrolithiasis    Panniculitis 05/12/2020   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HCC)    METASTATIC PAPILLARY THYROID CARCINOMA per 01/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 Completed 2 yrs Prednisone after lung bx confirmation. No symptoms since then.   Sebaceous cyst 04/21/2020   Suppurative hidradenitis    Thyroid cancer (HCC)  THYROID NODULE, RIGHT 02/08/2007    Past Surgical History:  Procedure Laterality Date   APPLICATION OF WOUND VAC N/A 01/20/2021   Procedure: APPLICATION OF WOUND VAC;  Surgeon: Peggye Form, DO;  Location: [facility];  Service: Plastics;  Laterality: N/A;   BREAST EXCISIONAL BIOPSY Right 04/26/2018   right axilla negative   BREAST EXCISIONAL BIOPSY Left 04/26/2018   left axilla negative   BREAST LUMPECTOMY  Right 10/03/2018   malignant   BREAST LUMPECTOMY WITH RADIOACTIVE SEED AND SENTINEL LYMPH NODE BIOPSY Right 10/03/2018   Procedure: RIGHT BREAST LUMPECTOMY WITH RADIOACTIVE SEED AND SENTINEL LYMPH NODE MAPPING;  Surgeon: Harriette Bouillon, MD;  [facility];  Service: General;  Laterality: Right;   BREAST SURGERY  1997   Breast Reduction    CYSTOSCOPY W/ URETERAL STENT REMOVAL  11/09/2012   Procedure: CYSTOSCOPY WITH STENT REMOVAL;  Surgeon: Sebastian Ache, MD;  [facility];  Service: Urology;  Laterality: Right;   CYSTOSCOPY WITH RETROGRADE PYELOGRAM, URETEROSCOPY AND STENT PLACEMENT  11/09/2012   Procedure: CYSTOSCOPY WITH RETROGRADE PYELOGRAM, URETEROSCOPY AND STENT PLACEMENT;  Surgeon: Sebastian Ache, MD;  [facility];  Service: Urology;  Laterality: Left;  LEFT URETEROSCOPY, STONE MANIPULATION, left STENT exchange    CYSTOSCOPY WITH STENT PLACEMENT  10/02/2012   Procedure: CYSTOSCOPY WITH STENT PLACEMENT;  Surgeon: Sebastian Ache, MD;  [facility];  Service: Urology;  Laterality: Left;   DEBRIDEMENT AND CLOSURE WOUND N/A 01/20/2021   Procedure: Excision of abdominal wound with closure;  Surgeon: Peggye Form, DO;  Location: [facility];  Service: Plastics;  Laterality: N/A;   DILATION AND CURETTAGE OF UTERUS  11/2002   s/p for 1st trimester nonviable pregnancy   EYE SURGERY     sty under eyelid   INCISE AND DRAIN ABCESS  08/2002   s/p I &D for righ inframmary fold hidradenitis   INCISION AND DRAINAGE PERITONSILLAR ABSCESS  12/2001   IR CV LINE INJECTION  06/07/2018   IR FLUORO GUIDE CV LINE RIGHT  01/30/2023   IR IMAGING GUIDED PORT INSERTION  06/20/2018   IR REMOVAL TUN ACCESS W/ PORT W/O FL MOD SED  06/20/2018   IR US GUIDE VASC ACCESS RIGHT  01/30/2023   IRRIGATION AND DEBRIDEMENT ABSCESS  01/31/2012   Procedure: IRRIGATION AND DEBRIDEMENT ABSCESS;  Surgeon: Kandis Cocking, MD;  [facility];  Service: General;  Laterality: Right;  right  breast and axilla    NEPHROLITHOTOMY  10/02/2012   Procedure: NEPHROLITHOTOMY PERCUTANEOUS;  Surgeon: Sebastian Ache, MD;  [facility];  Service: Urology;  Laterality: Right;  First Stage Percutaneous Nephrolithotomy with Surgeon Access, Left Ureteral Stent     NEPHROLITHOTOMY  10/04/2012   Procedure: NEPHROLITHOTOMY PERCUTANEOUS SECOND LOOK;  Surgeon: Sebastian Ache, MD;  [facility];  Service: Urology;  Laterality: Right;      NEPHROLITHOTOMY  10/08/2012   Procedure: NEPHROLITHOTOMY PERCUTANEOUS;  Surgeon: Sebastian Ache, MD;  [facility];  Service: Urology;  Laterality: Right;  THIRD STAGE, nephrostomy tube exchange x 2   NEPHROLITHOTOMY  10/11/2012   Procedure: NEPHROLITHOTOMY PERCUTANEOUS SECOND LOOK;  Surgeon: Sebastian Ache, MD;  [facility];  Service: Urology;  Laterality: Right;  RIGHT 4 STAGE PERCUTANOUS NEPHROLITHOTOMY, right URETEROSCOPY WITH HOLMIUM LASER    PANNICULECTOMY N/A 12/21/2020   Procedure: PANNICULECTOMY;  Surgeon: Peggye Form, DO;  [facility];  Service: Plastics;  Laterality: N/A;   PERCUTANEOUS NEPHROSTOLITHOTOMY  04/2022   PORT-A-CATH REMOVAL N/A 07/16/2020  Procedure: REMOVAL PORT-A-CATH;  Surgeon: Harriette Bouillon, MD;  Location: [facility];  Service: General;  Laterality: N/A;   PORTACATH PLACEMENT Left 05/17/2018   Procedure: INSERTION PORT-A-CATH;  Surgeon: Abigail Miyamoto, MD;  Location: [facility];  Service: General;  Laterality: Left;   RADICAL NECK DISSECTION  04/12/2017   limited/notes 04/12/2017   RADICAL NECK DISSECTION N/A 04/12/2017   Procedure: RADICAL NECK DISSECTION;  Surgeon: Christia Reading, MD;  Location: [facility];  Service: ENT;  Laterality: N/A;  limited neck dissection 2 hours total   REDUCTION MAMMAPLASTY Bilateral 1998   RIGHT/LEFT HEART CATH AND CORONARY ANGIOGRAPHY N/A 03/12/2020   Procedure: RIGHT/LEFT HEART CATH AND CORONARY ANGIOGRAPHY;  Surgeon: Dolores Patty, MD;   [facility];  Service: Cardiovascular;  Laterality: N/A;   [place]  1994   THORACIC AORTIC ENDOVASCULAR STENT GRAFT N/A 02/02/2023   Procedure: THORACIC AORTIC ENDOVASCULAR STENT GRAFT;  Surgeon: Victorino Sparrow, MD;  Location: [facility] OR;  Service: Vascular;  Laterality: N/A;   THYROIDECTOMY  04/12/2017   completion/notes 04/12/2017   THYROIDECTOMY N/A 04/12/2017   Procedure: THYROIDECTOMY;  Surgeon: Christia Reading, MD;  Location: [facility] OR;  Service: ENT;  Laterality: N/A;  Completion Thyroidectomy   TOTAL THYROIDECTOMY  2010    No outpatient medications have been marked as taking for the 05/19/23 encounter (Office Visit) with Victorino Sparrow, MD.    12 system ROS was negative unless otherwise noted in HPI  Physical Exam Constitutional:      Appearance: Normal appearance. She is obese.  HENT:     Head: Normocephalic.     Mouth/Throat:     Pharynx: Oropharynx is clear.  Eyes:     Extraocular Movements: Extraocular movements intact.     Pupils: Pupils are equal, round, and reactive to light.  Cardiovascular:     Rate and Rhythm: Normal rate and regular rhythm.     Pulses: Normal pulses.  Pulmonary:     Effort: Pulmonary effort is normal.  Abdominal:     General: There is no distension.     Palpations: Abdomen is soft.     Tenderness: There is no abdominal tenderness.  Musculoskeletal:        General: No swelling.  Skin:    General: Skin is warm and dry.     Coloration: Skin is not jaundiced.  Neurological:     General: No focal deficit present.     Mental Status: She is alert and oriented to person, place, and time.       Observations/Objective:  CT angio reveals slipping of the TEVAR, with aneurysmal degeneration proximally.  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  There is also bird beaking of the graft.  Assessment and Plan:  Patient is status post TEVAR for symptomatic TBAD.  Interval imaging  today demonstrates significant degeneration over the last 2 months at the proximal aspect of the graft.  The graft has slipped, and now appears to be in zone 3.  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  Patrena will need surgical revision.  Initially, I thought this could be repaired from an endovascular standpoint, however due to the acute angle, and lack of flexibility within the graft, I think she would have a suboptimal result that likely would not seal.  I have reached  out to my colleague Dr. Laneta Simmers to discuss arch to branching.  After assessing her CT scan, he felt as though she would be a good anatomic candidate.  I have placed a referral to his office for the 2 to meet.  At the time of the branching, my plan would be to perform thoracic stenting as well as left subclavian laser fenestration.  Jeanet and I also talked about her visceral aorta, and some of the growth appreciated at the level of the celiac artery.  My plan at the time of surgery is to balloon angioplasty and try to rupture the septum of the dissection with or without [place] technique.  She is aware that she is not a great surgical candidate, especially with her end-stage renal disease.  She is currently being dialyzed through a right-sided TDC.  Unfortunately, I do not think she has any viable endovascular options.  Eunita and her family were present, and were comfortable moving forward with the above.  I offered a second opinion, as I always do with complicated cases, and they declined a referral to [facility].  I asked that she call my office should any symptoms change in the interim.  I have sent a referral to my colleague Dr. Laneta Simmers.  With her comorbidities, if she is deemed a poor physiologic candidate for open chest, would discuss extra-anatomic reconstruction referral to [facility]   In the interim we also discussed the importance of blood pressure  control.  She stated she has been struggling with this on dialysis.  Plan to reach out to her nephrologist regarding the above.   Signed, Victorino Sparrow [facility] [place] Office: [phone]  05/23/2023, 1:34 PM

## 2023-05-24 DIAGNOSIS — Z992 Dependence on renal dialysis: Secondary | ICD-10-CM | POA: Diagnosis not present

## 2023-05-24 DIAGNOSIS — N186 End stage renal disease: Secondary | ICD-10-CM | POA: Diagnosis not present

## 2023-05-24 DIAGNOSIS — N2581 Secondary hyperparathyroidism of renal origin: Secondary | ICD-10-CM | POA: Diagnosis not present

## 2023-05-25 ENCOUNTER — Telehealth: Payer: Self-pay | Admitting: Primary Care

## 2023-05-25 ENCOUNTER — Other Ambulatory Visit: Payer: Self-pay | Admitting: Primary Care

## 2023-05-25 DIAGNOSIS — J4530 Mild persistent asthma, uncomplicated: Secondary | ICD-10-CM

## 2023-05-25 NOTE — Telephone Encounter (Addendum)
Please call patient:  Please ask what her blood pressure readings have been running in dialysis?  Please do a complete medication reconciliation with her on the phone with her medications in front of her.  Particularly, will need to check to see if she is taking amlodipine 5 mg for blood pressure, losartan 50 mg for blood pressure carvedilol 25 mg twice daily for blood pressure.  Has her kidney doctor contacted her about her blood pressure readings?    ----- Message from Victorino Sparrow sent at 05/23/2023  1:55 PM EDT ----- Regarding: BP control Hello team,   I think Sajdah is a mutual patient for all of [place].  She has been having blood pressure control issues on dialysis.  This is pivotal, as she has had degeneration of her aorta which will require future surgery.  I will take any help I can get in blood pressure management in the interim.  Thanks,  -[employer]

## 2023-05-25 NOTE — Telephone Encounter (Signed)
Unable to reach patient. Left voicemail to return call to our office.   

## 2023-05-26 ENCOUNTER — Other Ambulatory Visit: Payer: Self-pay | Admitting: Infectious Diseases

## 2023-05-26 DIAGNOSIS — Z992 Dependence on renal dialysis: Secondary | ICD-10-CM | POA: Diagnosis not present

## 2023-05-26 DIAGNOSIS — B20 Human immunodeficiency virus [HIV] disease: Secondary | ICD-10-CM

## 2023-05-26 DIAGNOSIS — N2581 Secondary hyperparathyroidism of renal origin: Secondary | ICD-10-CM | POA: Diagnosis not present

## 2023-05-26 DIAGNOSIS — N186 End stage renal disease: Secondary | ICD-10-CM | POA: Diagnosis not present

## 2023-05-29 DIAGNOSIS — Z992 Dependence on renal dialysis: Secondary | ICD-10-CM | POA: Diagnosis not present

## 2023-05-29 DIAGNOSIS — N186 End stage renal disease: Secondary | ICD-10-CM | POA: Diagnosis not present

## 2023-05-29 DIAGNOSIS — N2581 Secondary hyperparathyroidism of renal origin: Secondary | ICD-10-CM | POA: Diagnosis not present

## 2023-05-29 MED ORDER — DESCOVY 200-25 MG PO TABS
1.0000 | ORAL_TABLET | Freq: Every day | ORAL | 0 refills | Status: DC
Start: 2023-05-29 — End: 2023-06-08

## 2023-05-29 NOTE — Telephone Encounter (Signed)
Called patient was at appointment not able to review medications would like call back tomorrow when she will be at home.

## 2023-05-29 NOTE — Telephone Encounter (Signed)
Patient needs appointment, front desk to reach out for scheduling.   Sandie Ano, RN

## 2023-05-31 ENCOUNTER — Other Ambulatory Visit: Payer: Self-pay | Admitting: Infectious Diseases

## 2023-05-31 DIAGNOSIS — B20 Human immunodeficiency virus [HIV] disease: Secondary | ICD-10-CM

## 2023-05-31 DIAGNOSIS — N186 End stage renal disease: Secondary | ICD-10-CM | POA: Diagnosis not present

## 2023-05-31 DIAGNOSIS — Z992 Dependence on renal dialysis: Secondary | ICD-10-CM | POA: Diagnosis not present

## 2023-05-31 DIAGNOSIS — N2581 Secondary hyperparathyroidism of renal origin: Secondary | ICD-10-CM | POA: Diagnosis not present

## 2023-06-02 DIAGNOSIS — N2581 Secondary hyperparathyroidism of renal origin: Secondary | ICD-10-CM | POA: Diagnosis not present

## 2023-06-02 DIAGNOSIS — N186 End stage renal disease: Secondary | ICD-10-CM | POA: Diagnosis not present

## 2023-06-02 DIAGNOSIS — Z992 Dependence on renal dialysis: Secondary | ICD-10-CM | POA: Diagnosis not present

## 2023-06-05 DIAGNOSIS — Z992 Dependence on renal dialysis: Secondary | ICD-10-CM | POA: Diagnosis not present

## 2023-06-05 DIAGNOSIS — N2581 Secondary hyperparathyroidism of renal origin: Secondary | ICD-10-CM | POA: Diagnosis not present

## 2023-06-05 DIAGNOSIS — N186 End stage renal disease: Secondary | ICD-10-CM | POA: Diagnosis not present

## 2023-06-06 DIAGNOSIS — H35033 Hypertensive retinopathy, bilateral: Secondary | ICD-10-CM | POA: Diagnosis not present

## 2023-06-06 DIAGNOSIS — H52223 Regular astigmatism, bilateral: Secondary | ICD-10-CM | POA: Diagnosis not present

## 2023-06-06 DIAGNOSIS — H524 Presbyopia: Secondary | ICD-10-CM | POA: Diagnosis not present

## 2023-06-06 DIAGNOSIS — H2513 Age-related nuclear cataract, bilateral: Secondary | ICD-10-CM | POA: Diagnosis not present

## 2023-06-06 DIAGNOSIS — H5203 Hypermetropia, bilateral: Secondary | ICD-10-CM | POA: Diagnosis not present

## 2023-06-06 DIAGNOSIS — Z135 Encounter for screening for eye and ear disorders: Secondary | ICD-10-CM | POA: Diagnosis not present

## 2023-06-06 DIAGNOSIS — H04123 Dry eye syndrome of bilateral lacrimal glands: Secondary | ICD-10-CM | POA: Diagnosis not present

## 2023-06-06 NOTE — Telephone Encounter (Signed)
Noted, reviewed medication list.  BP Readings from Last 3 Encounters:  05/19/23 136/84  04/27/23 130/64  03/31/23 126/79   Blood pressure appears to be stable overall. Will defer dialysis hypertensive readings to nephrology.

## 2023-06-06 NOTE — Telephone Encounter (Signed)
I have called and reviewed medication list with patient. Placed in your box for review.

## 2023-06-06 NOTE — Addendum Note (Signed)
Addended by: Doreene Nest on: 06/06/2023 01:06 PM   Modules accepted: Orders

## 2023-06-07 DIAGNOSIS — N2581 Secondary hyperparathyroidism of renal origin: Secondary | ICD-10-CM | POA: Diagnosis not present

## 2023-06-07 DIAGNOSIS — N186 End stage renal disease: Secondary | ICD-10-CM | POA: Diagnosis not present

## 2023-06-07 DIAGNOSIS — Z992 Dependence on renal dialysis: Secondary | ICD-10-CM | POA: Diagnosis not present

## 2023-06-08 ENCOUNTER — Other Ambulatory Visit: Payer: Self-pay

## 2023-06-08 ENCOUNTER — Encounter: Payer: Self-pay | Admitting: Infectious Diseases

## 2023-06-08 ENCOUNTER — Ambulatory Visit (INDEPENDENT_AMBULATORY_CARE_PROVIDER_SITE_OTHER): Payer: Medicare HMO | Admitting: Infectious Diseases

## 2023-06-08 VITALS — BP 164/84 | HR 72 | Resp 16 | Ht 65.0 in | Wt 184.0 lb

## 2023-06-08 DIAGNOSIS — L732 Hidradenitis suppurativa: Secondary | ICD-10-CM

## 2023-06-08 DIAGNOSIS — B20 Human immunodeficiency virus [HIV] disease: Secondary | ICD-10-CM

## 2023-06-08 DIAGNOSIS — M25511 Pain in right shoulder: Secondary | ICD-10-CM

## 2023-06-08 DIAGNOSIS — G8911 Acute pain due to trauma: Secondary | ICD-10-CM

## 2023-06-08 HISTORY — DX: Acute pain due to trauma: M25.511

## 2023-06-08 MED ORDER — DESCOVY 200-25 MG PO TABS
1.0000 | ORAL_TABLET | Freq: Every day | ORAL | 11 refills | Status: DC
Start: 2023-06-08 — End: 2024-03-13

## 2023-06-08 MED ORDER — TIVICAY 50 MG PO TABS
50.0000 mg | ORAL_TABLET | Freq: Every day | ORAL | 11 refills | Status: DC
Start: 2023-06-08 — End: 2024-03-13

## 2023-06-08 NOTE — Assessment & Plan Note (Signed)
Pain in the lateral shoulder over joint from mechanical fall chasing her dog. She has no significant point tenderness and ROM though limited today is better. Discussed supportive measures and provided information for sports medicine appointment - no referral needed.

## 2023-06-08 NOTE — Assessment & Plan Note (Signed)
Last fill in April 2024. She has been working with breast surgeon from previous notes for consideration of surgical excision. We did not discuss this today.

## 2023-06-08 NOTE — Patient Instructions (Addendum)
So nice to meet you!  Please continue the [name] + Descovy together everyday.   I am happy to do a pap smear for you - you are due after October - schedule an appointment up front.   Sports Medicine - Would recommend Dr. Terrilee Files at [facility]  [address] [place], [place] [address]

## 2023-06-08 NOTE — Progress Notes (Signed)
Name: [name]  DOB: 12/13/1965 MRN: [id number] PCP: Doreene Nest, NP     Brief Narrative:  [name] is a 57 y.o. female with well controlled HIV, stage 2 at diagnosis, Dx in 2005.  Notable PMHx includes ESRD on HD since April 2024 HTN, Sarcoidosis, MRSA boils/HS, hypothyroidism, breast cancer 2019 CD4 nadir > 200 but specifics not detailed  VL unknown HIV Risk: sexual History of OIs: none Intake Labs: Hep B sAg (-), sAb (+), cAb (-); Hep A (+), Hep C (-) Quantiferon () HLA B*5701 (- 2019) G6PD: ()   Previous Regimens: Atripla 2008 (terrible nightmares)  Genvoya 2016 (severe itching / papular rash all over) [facility] 2016 (chemo interaction with RPV) [name] + Descovy 2019  Genotypes: 2022 - wildtype  Subjective:   Chief Complaint  Patient presents with   Follow-up     HPI: [name] is here for annual follow up regarding HIV treatment. This is my first visit with her - she was previously following with our recently retired partner, Dr. Cliffton Asters since 2005. We spent time discussing earlier years of her treatment.  She continues to take both tivicay + descovy together everyday for her treatment. She has missed 4 days due to delay in her prescriptions  for over due appointment   She has had many other health concerns over the years most recently with initiation of hemodialysis via right chest catheter since April 2024. It is rigorous and she has a lot of fatigue following sessions three days a week.  She has one 102 yo daughter. Not currently sexually active. Due for pap smear October 2024 - remote h/o mild abnormal findings but never called back for further testing or treatment.   She has hidradenitis and has required chronic suppressive tetracyclines (most recently minocycline) to keep lesions in better control under her breast. This was switched to minocycline due to concern that her doxycycline may have exacerbated her brother's  depression and contributed to suicide.   Two Saturdays ago she had to intervene on her dog in a quarrel with another dog. She did fall hard on her right hip and shoulder. Hematoma in the right hip is healing, but her shoulder continues to cause pain and limited ROM. It is trending toward improvement with time, massage and Tylenol for pain treatment.       06/08/2023    2:04 PM  Depression screen PHQ 2/9  Decreased Interest 0  Down, Depressed, Hopeless 0  PHQ - 2 Score 0     Review of Systems  Constitutional:  Negative for chills, fever, malaise/fatigue and weight loss.  HENT:  Negative for sore throat.   Respiratory:  Negative for cough, sputum production and shortness of breath.   Cardiovascular: Negative.   Gastrointestinal:  Negative for abdominal pain, diarrhea and vomiting.  Musculoskeletal:  Negative for joint pain, myalgias and neck pain.  Skin:  Negative for rash.  Neurological:  Negative for headaches.  Psychiatric/Behavioral:  Negative for depression and substance abuse. The patient is not nervous/anxious.     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  Genes Analyzed (43 total): APC*,  ATM*, AXIN2, BARD1, BMPR1A, BRCA1*, BRCA2*, BRIP1*, CDH1*, CDK4, CDKN2A, CHEK2*, DICER1, GALNT12, HOXB13, MEN1, MLH1*, MRE11A, MSH2*, MSH3, MSH6*, MUTYH*, NBN, NF1*, NTHL1, PALB2*, PMS2*, POLD1, POLE, PTEN*, RAD50, RAD51C*, RAD51D*, RET, SDHB, SDHD, SMAD4, SMARCA4, STK11 and TP53* (sequencing and deletion/duplication); EGFR (s   GERD 07/24/2006   GRAVE'S DISEASE 01/01/2008   History of hidradenitis suppurativa    History of kidney stones    History of thrush    HIV DISEASE 07/24/2006   dx March 05   Hyperlipidemia    HYPERTENSION 07/24/2006   Hyperthyroidism 08/2006   Grave's Disease -diffuse radiotracer uptake 08/25/06 Thyroid scan-Cold nodule to R lower lobe of thyrorid   Ileus (HCC) 02/14/2023   Menometrorrhagia    hx of   Nephrolithiasis    Panniculitis 05/12/2020   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HCC)    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 Completed 2 yrs Prednisone after lung bx confirmation. No symptoms since then.   Sebaceous cyst 04/21/2020   Suppurative hidradenitis    Thyroid cancer (HCC)    THYROID NODULE, RIGHT 02/08/2007    Outpatient Medications Prior to Visit  Medication Sig Dispense Refill   albuterol (VENTOLIN HFA) 108 (90 Base) MCG/ACT inhaler Inhale 2 puffs into the lungs every 6 (six) hours as needed for wheezing or shortness of breath. 8 g 0   allopurinol (ZYLOPRIM) 100 MG tablet TAKE 1 TABLET BY MOUTH EVERY DAY FOR GOUT PREVENTION 90 tablet 2   amLODipine (NORVASC) 10 MG tablet Take 10 mg by mouth daily.      AURYXIA 1 GM 210 MG(Fe) tablet Take 420 mg by mouth 3 (three) times daily.     B Complex-C-Folic Acid (DIALYVITE 800) 0.8 MG TABS Take 1 tablet by mouth daily.     Blood Glucose Monitoring Suppl (FREESTYLE LITE) w/Device KIT Inject 1 each into the skin daily. 1 kit 0   carvedilol (COREG) 25 MG tablet Take 1 tablet (25 mg total) by mouth 2 (two) times daily. 180 tablet 3   fluticasone (FLOVENT HFA) 110 MCG/ACT inhaler Inhale 1 puff into the lungs 2 (two) times daily. TAKE 1 PUFF BY MOUTH TWICE A DAY Strength: 110 MCG/ACT 3 each 1   Lancets (ONETOUCH ULTRASOFT) lancets Use as instructed to test blood sugar daily 100 each 5   levocetirizine (XYZAL) 5 MG tablet Take 1 tablet (5 mg total) by mouth every evening. For allergies 90 tablet 3   levothyroxine (SYNTHROID) 175 MCG tablet TAKE 1 TABLET BY MOUTH EVERY DAY 30 tablet 7   losartan (COZAAR) 50 MG tablet Take  1 tablet (50 mg total) by mouth daily. for blood pressure. 90 tablet 3   minocycline (DYNACIN) 100 MG tablet Take 1 tablet (100 mg total) by mouth 2 (two) times daily. 60 tablet 11   omeprazole (PRILOSEC) 40 MG capsule Take 1 capsule (40 mg total) by mouth 2 (two) times daily before a meal. 180 capsule 0   rosuvastatin (CRESTOR) 10 MG tablet TAKE 1 TABLET BY MOUTH EVERY DAY FOR CHOLESTEROL (Patient taking differently: Take 10 mg by mouth daily. FOR CHOLESTEROL) 90 tablet 2   saxagliptin HCl (ONGLYZA) 2.5 MG TABS tablet Take 1 tablet (2.5 mg total) by mouth daily. for diabetes. 90 tablet 1   traMADol (ULTRAM) 50 MG tablet Take 1 tablet (50 mg total) by mouth every 6 (six) hours as needed for moderate pain. 20 tablet 0   dolutegravir ([name]) 50 MG tablet TAKE 1 TABLET BY MOUTH EVERY DAY 30 tablet 0   emtricitabine-tenofovir AF (DESCOVY) 200-25 MG tablet Take 1 tablet by mouth daily. 30 tablet 0   acetaminophen (TYLENOL) 500 MG tablet Take 1,000 mg by mouth every 6 (six) hours as needed for mild pain. (Patient not taking: Reported on 04/27/2023)      metoCLOPramide (REGLAN) 5 MG tablet Take 1 tablet (5 mg total) by mouth every 6 (six) hours as needed for nausea. (Patient not taking: Reported on 04/27/2023) 20 tablet 0   ondansetron (ZOFRAN) 4 MG tablet Take 1 tablet (4 mg total) by mouth daily as needed for nausea or vomiting. (Patient not taking: Reported on 04/27/2023) 20 tablet 0   amLODipine (NORVASC) 5 MG tablet Take 1 tablet (5 mg total) by mouth daily. for blood pressure. 90 tablet 3   No facility-administered medications prior to visit.     Allergies  Allergen Reactions   Genvoya [Elviteg-Cobic-Emtricit-Tenofaf] Hives   Lisinopril Cough   Tape Rash    Rash  Okay with tegaderm and paper tape   Aldactone [Spironolactone] Hives   Tegaderm Ag Mesh [Silver] Itching    Social History   Tobacco Use   Smoking status: Former    Current packs/day: 0.00    Average packs/day: 0.5 packs/day for 15.0 years (7.5 ttl pk-yrs)    Types: Cigarettes    Start date: 04/12/2017    Quit date: 2019    Years since quitting: 5.6   Smokeless tobacco: Never  Vaping Use   Vaping status: Never Used  Substance Use Topics   Alcohol use: Yes    Alcohol/week: 0.0 standard drinks of alcohol    Comment: social   Drug use: No    Family History  Problem Relation Age of Onset   Hypertension Mother    Cancer Mother        laryngeal   Heart disease Mother        stent   Pancreatic cancer Father    Hypertension Father    Lung cancer Father 55       hx smoking   Hypertension Sister    Hypertension Sister    Hypertension Brother    Breast cancer Maternal Aunt 64   Breast cancer Maternal Aunt        dx 60+   Cancer Maternal Uncle        Lung CA   Breast cancer Paternal Aunt 75   Breast cancer Paternal Aunt        dx 26's   Breast cancer Paternal Aunt        dx 50's   Prostate cancer Paternal  Uncle    Prostate cancer Paternal Uncle    Lung cancer Paternal Uncle    Breast cancer Cousin 36   Breast cancer Cousin        dx <50   Breast  cancer Cousin        dx <50   Breast cancer Cousin        dx <50   Heart disease Other    Hypertension Other    Stroke Other        Grandparent   Kidney disease Other        Grandparent   Diabetes Other        FH of Diabetes   Colon cancer Neg Hx    Esophageal cancer Neg Hx    Rectal cancer Neg Hx    Stomach cancer Neg Hx     Social History   Substance and Sexual Activity  Sexual Activity Not Currently   Birth control/protection: Post-menopausal   Comment: declined condoms     Objective:   Vitals:   06/08/23 1405  BP: (!) 164/84  Pulse: 72  Resp: 16  Weight: 184 lb (83.5 kg)  Height: 5\' 5"  (1.651 m)   Body mass index is 30.62 kg/m.  Physical Exam  Lab Results Lab Results  Component Value Date   WBC 12.6 (H) 03/09/2023   HGB 8.7 (L) 03/09/2023   HCT 27.3 (L) 03/09/2023   MCV 96.8 03/09/2023   PLT 357 03/09/2023    Lab Results  Component Value Date   CREATININE 4.20 (H) 05/04/2023   BUN 27 (H) 03/09/2023   NA 134 (L) 03/09/2023   K 3.3 (L) 03/09/2023   CL 94 (L) 03/09/2023   CO2 27 03/09/2023    Lab Results  Component Value Date   ALT 13 03/09/2023   AST 16 03/09/2023   ALKPHOS 107 03/09/2023   BILITOT 0.5 03/09/2023    Lab Results  Component Value Date   CHOL 134 07/29/2022   HDL 35.90 (L) 07/29/2022   LDLCALC 77 07/29/2022   LDLDIRECT 171.0 06/05/2019   TRIG 79 02/13/2023   CHOLHDL 4 07/29/2022   HIV 1 RNA Quant (Copies/mL)  Date Value  12/28/2021 Not Detected  06/15/2021 <20 (H)  03/17/2021 1,490 (H)   CD4 T Cell Abs (/uL)  Date Value  03/17/2021 626  08/18/2020 624  11/20/2019 414     Assessment & Plan:   Problem List Items Addressed This Visit       Unprioritized   Acute pain of right shoulder due to trauma    Pain in the lateral shoulder over joint from mechanical fall chasing her dog. She has no significant point tenderness and ROM though limited today is better. Discussed supportive measures and provided  information for sports medicine appointment - no referral needed.       Hidradenitis suppurativa    Last fill in April 2024. She has been working with breast surgeon from previous notes for consideration of surgical excision. We did not discuss this today.        Relevant Medications   emtricitabine-tenofovir AF (DESCOVY) 200-25 MG tablet   dolutegravir ([name]) 50 MG tablet   HIV (human immunodeficiency virus infection) (HCC) - Primary    Very well controlled on once daily [name] and Descovy taken together. Few days of missed pills d/t overdue appointment, otherwise no concerns with access or adherence to medication. They are tolerating the medication well without side effects. No drug interactions identified. Pertinent lab tests ordered  today.  No changes to insurance coverage.  No dental needs today.  No concern over anxious/depressed mood.  Sexual health and family planning discussed - no sexual partners at this time. Pap smear in 2021 was normal cytology and negative HPV. Due for repeat in October - will have her back then to update this for her.  Flu and COVID booster annually recommended in October.  On statin for secondary prevention currently.   RTC in October for pap smear, annual visit in 19m. Labs same day preferred.        Relevant Medications   emtricitabine-tenofovir AF (DESCOVY) 200-25 MG tablet   dolutegravir ([name]) 50 MG tablet   Other Relevant Orders   HIV 1 RNA quant-no reflex-bld   T-helper cells (CD4) count   Total encounter time spent 60 minutes including face to face discussion, review of previous records from ID clinic from 2013 - 2024 including annual notes, multiple labs ordered by other providers and hospital records from most recent stay in April.   [name], MSN, NP-C [facility] Infectious Disease [facility] Pager: [phone] Office: [phone]  06/08/23  7:38 PM

## 2023-06-08 NOTE — Assessment & Plan Note (Signed)
Very well controlled on once daily [name] and Descovy taken together. Few days of missed pills d/t overdue appointment, otherwise no concerns with access or adherence to medication. They are tolerating the medication well without side effects. No drug interactions identified. Pertinent lab tests ordered today.  No changes to insurance coverage.  No dental needs today.  No concern over anxious/depressed mood.  Sexual health and family planning discussed - no sexual partners at this time. Pap smear in 2021 was normal cytology and negative HPV. Due for repeat in October - will have her back then to update this for her.  Flu and COVID booster annually recommended in October.  On statin for secondary prevention currently.   RTC in October for pap smear, annual visit in 70m. Labs same day preferred.

## 2023-06-09 DIAGNOSIS — N186 End stage renal disease: Secondary | ICD-10-CM | POA: Diagnosis not present

## 2023-06-09 DIAGNOSIS — Z992 Dependence on renal dialysis: Secondary | ICD-10-CM | POA: Diagnosis not present

## 2023-06-09 DIAGNOSIS — N2581 Secondary hyperparathyroidism of renal origin: Secondary | ICD-10-CM | POA: Diagnosis not present

## 2023-06-09 LAB — T-HELPER CELLS (CD4) COUNT (NOT AT ARMC)
CD4 % Helper T Cell: 43 % (ref 33–65)
CD4 T Cell Abs: 635 /uL (ref 400–1790)

## 2023-06-10 LAB — HIV-1 RNA QUANT-NO REFLEX-BLD
HIV 1 RNA Quant: <20 {copies}/mL — ABNORMAL HIGH
HIV-1 RNA Quant, Log: <1.3 {Log_copies}/mL — ABNORMAL HIGH

## 2023-06-12 DIAGNOSIS — N186 End stage renal disease: Secondary | ICD-10-CM | POA: Diagnosis not present

## 2023-06-12 DIAGNOSIS — Z992 Dependence on renal dialysis: Secondary | ICD-10-CM | POA: Diagnosis not present

## 2023-06-12 DIAGNOSIS — N2581 Secondary hyperparathyroidism of renal origin: Secondary | ICD-10-CM | POA: Diagnosis not present

## 2023-06-14 DIAGNOSIS — Z992 Dependence on renal dialysis: Secondary | ICD-10-CM | POA: Diagnosis not present

## 2023-06-14 DIAGNOSIS — N186 End stage renal disease: Secondary | ICD-10-CM | POA: Diagnosis not present

## 2023-06-14 DIAGNOSIS — N2581 Secondary hyperparathyroidism of renal origin: Secondary | ICD-10-CM | POA: Diagnosis not present

## 2023-06-16 DIAGNOSIS — N186 End stage renal disease: Secondary | ICD-10-CM | POA: Diagnosis not present

## 2023-06-16 DIAGNOSIS — Z992 Dependence on renal dialysis: Secondary | ICD-10-CM | POA: Diagnosis not present

## 2023-06-16 DIAGNOSIS — N2581 Secondary hyperparathyroidism of renal origin: Secondary | ICD-10-CM | POA: Diagnosis not present

## 2023-06-17 DIAGNOSIS — N186 End stage renal disease: Secondary | ICD-10-CM | POA: Diagnosis not present

## 2023-06-17 DIAGNOSIS — Z992 Dependence on renal dialysis: Secondary | ICD-10-CM | POA: Diagnosis not present

## 2023-06-17 DIAGNOSIS — E1122 Type 2 diabetes mellitus with diabetic chronic kidney disease: Secondary | ICD-10-CM | POA: Diagnosis not present

## 2023-06-19 DIAGNOSIS — N2581 Secondary hyperparathyroidism of renal origin: Secondary | ICD-10-CM | POA: Diagnosis not present

## 2023-06-19 DIAGNOSIS — N186 End stage renal disease: Secondary | ICD-10-CM | POA: Diagnosis not present

## 2023-06-19 DIAGNOSIS — Z992 Dependence on renal dialysis: Secondary | ICD-10-CM | POA: Diagnosis not present

## 2023-06-21 ENCOUNTER — Encounter: Payer: Self-pay | Admitting: Surgery

## 2023-06-21 ENCOUNTER — Institutional Professional Consult (permissible substitution): Payer: Medicare HMO | Admitting: Surgery

## 2023-06-21 VITALS — BP 112/62 | HR 70 | Resp 20 | Ht 65.0 in | Wt 179.0 lb

## 2023-06-21 DIAGNOSIS — Z992 Dependence on renal dialysis: Secondary | ICD-10-CM | POA: Diagnosis not present

## 2023-06-21 DIAGNOSIS — I7103 Dissection of thoracoabdominal aorta: Secondary | ICD-10-CM

## 2023-06-21 DIAGNOSIS — N2581 Secondary hyperparathyroidism of renal origin: Secondary | ICD-10-CM | POA: Diagnosis not present

## 2023-06-21 DIAGNOSIS — N186 End stage renal disease: Secondary | ICD-10-CM | POA: Diagnosis not present

## 2023-06-21 NOTE — Progress Notes (Signed)
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  Dr. Sherral Hammers reviewed the scans and felt that placement of another endovascular stent would first require de-branching of the aortic arch to allow the stent graft to cross the entire aortic arch with an adequate proximal landing zone.  The patient tells me that she has had no chest or back pain.  She has been doing well with dialysis.  She does report some vague symptoms in her lower extremities that she describes as severe pain and inability to move her extremities.  She said this is not related to any specific activity and not related to dialysis.  She is still able to stand on her legs but does not feel like she can move.  She usually has to hold onto something or sit down.  It involves both legs.  She reports the pain as being the whole way up and down her legs.  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  Genes Analyzed (43 total): APC*, ATM*, AXIN2, BARD1, BMPR1A, BRCA1*, BRCA2*, BRIP1*, CDH1*, CDK4, CDKN2A, CHEK2*, DICER1, GALNT12, HOXB13, MEN1, MLH1*, MRE11A, MSH2*, MSH3, MSH6*, MUTYH*, NBN, NF1*, NTHL1, PALB2*, PMS2*, POLD1, POLE, PTEN*, RAD50, RAD51C*, RAD51D*, RET, SDHB, SDHD, SMAD4, SMARCA4, STK11 and TP53* (sequencing and deletion/duplication); EGFR (s   GERD 07/24/2006   GRAVE'S DISEASE 01/01/2008   History of hidradenitis suppurativa    History of kidney stones    History of thrush    HIV DISEASE 07/24/2006   dx March 05   Hyperlipidemia    HYPERTENSION 07/24/2006   Hyperthyroidism 08/2006   Grave's Disease -diffuse radiotracer uptake 08/25/06 Thyroid scan-Cold nodule to R lower lobe of thyrorid  Ileus (HCC) 02/14/2023   Menometrorrhagia    hx of   Nephrolithiasis    Panniculitis 05/12/2020   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HCC)    METASTATIC PAPILLARY THYROID CARCINOMA per 01/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 Completed 2 yrs Prednisone after lung bx confirmation. No symptoms since then.   Sebaceous cyst 04/21/2020   Suppurative hidradenitis    Thyroid cancer (HCC)    THYROID NODULE, RIGHT 02/08/2007    Past Surgical History:  Procedure Laterality Date   APPLICATION OF WOUND VAC N/A 01/20/2021   Procedure: APPLICATION OF WOUND VAC;  Surgeon: Peggye Form, DO;  Location: [facility];  Service: Plastics;  Laterality: N/A;   BREAST EXCISIONAL BIOPSY Right 04/26/2018   right axilla negative   BREAST EXCISIONAL BIOPSY Left 04/26/2018   left axilla negative   BREAST LUMPECTOMY Right 10/03/2018   malignant   BREAST LUMPECTOMY WITH RADIOACTIVE SEED AND SENTINEL LYMPH NODE BIOPSY Right 10/03/2018   Procedure: RIGHT BREAST LUMPECTOMY WITH RADIOACTIVE SEED AND SENTINEL LYMPH NODE MAPPING;  Surgeon: Harriette Bouillon, MD;  Location: [facility];  Service: General;  Laterality: Right;   BREAST SURGERY  1997   Breast Reduction    CYSTOSCOPY W/ URETERAL STENT REMOVAL  11/09/2012   Procedure: CYSTOSCOPY WITH STENT REMOVAL;  Surgeon: Sebastian Ache, MD;  Location: [facility];  Service: Urology;  Laterality: Right;   CYSTOSCOPY WITH RETROGRADE PYELOGRAM, URETEROSCOPY AND STENT PLACEMENT  11/09/2012   Procedure: CYSTOSCOPY WITH RETROGRADE PYELOGRAM, URETEROSCOPY AND STENT PLACEMENT;  Surgeon: Sebastian Ache, MD;  Location: [facility];  Service: Urology;  Laterality: Left;  LEFT URETEROSCOPY, STONE MANIPULATION, left STENT exchange    CYSTOSCOPY WITH STENT PLACEMENT  10/02/2012   Procedure: CYSTOSCOPY WITH STENT PLACEMENT;  Surgeon: Sebastian Ache, MD;  Location: [facility];  Service: Urology;  Laterality: Left;   DEBRIDEMENT AND CLOSURE WOUND N/A 01/20/2021   Procedure: Excision of abdominal wound with closure;  Surgeon: Peggye Form, DO;   Location: [facility];  Service: Plastics;  Laterality: N/A;   DILATION AND CURETTAGE OF UTERUS  11/2002   s/p for 1st trimester nonviable pregnancy   EYE SURGERY     sty under eyelid   INCISE AND DRAIN ABCESS  08/2002   s/p I &D for righ inframmary fold hidradenitis   INCISION AND DRAINAGE PERITONSILLAR ABSCESS  12/2001   IR CV LINE INJECTION  06/07/2018   IR FLUORO GUIDE CV LINE RIGHT  01/30/2023   IR IMAGING GUIDED PORT INSERTION  06/20/2018   IR REMOVAL TUN ACCESS W/ PORT W/O FL MOD SED  06/20/2018   IR US GUIDE VASC ACCESS RIGHT  01/30/2023   IRRIGATION AND DEBRIDEMENT ABSCESS  01/31/2012   Procedure: IRRIGATION AND DEBRIDEMENT ABSCESS;  Surgeon: Kandis Cocking, MD;  Location: [facility];  Service: General;  Laterality: Right;  right breast and axilla    NEPHROLITHOTOMY  10/02/2012   Procedure: NEPHROLITHOTOMY PERCUTANEOUS;  Surgeon: Sebastian Ache, MD;  Location: [facility];  Service: Urology;  Laterality: Right;  First Stage Percutaneous Nephrolithotomy with Surgeon Access, Left Ureteral Stent  NEPHROLITHOTOMY  10/04/2012   Procedure: NEPHROLITHOTOMY PERCUTANEOUS SECOND LOOK;  Surgeon: Sebastian Ache, MD;  Location: [facility];  Service: Urology;  Laterality: Right;      NEPHROLITHOTOMY  10/08/2012   Procedure: NEPHROLITHOTOMY PERCUTANEOUS;  Surgeon: Sebastian Ache, MD;  Location: [facility];  Service: Urology;  Laterality: Right;  THIRD STAGE, nephrostomy tube exchange x 2   NEPHROLITHOTOMY  10/11/2012   Procedure: NEPHROLITHOTOMY PERCUTANEOUS SECOND LOOK;  Surgeon: Sebastian Ache, MD;  Location: [facility];  Service: Urology;  Laterality: Right;  RIGHT 4 STAGE PERCUTANOUS NEPHROLITHOTOMY, right URETEROSCOPY WITH HOLMIUM LASER    PANNICULECTOMY N/A 12/21/2020   Procedure: PANNICULECTOMY;  Surgeon: Peggye Form, DO;  Location: [facility];  Service: Plastics;  Laterality: N/A;   PERCUTANEOUS NEPHROSTOLITHOTOMY  04/2022   PORT-A-CATH REMOVAL N/A 07/16/2020   Procedure: REMOVAL  PORT-A-CATH;  Surgeon: Harriette Bouillon, MD;  Location: [facility];  Service: General;  Laterality: N/A;   PORTACATH PLACEMENT Left 05/17/2018   Procedure: INSERTION PORT-A-CATH;  Surgeon: Abigail Miyamoto, MD;  Location: [facility];  Service: General;  Laterality: Left;   RADICAL NECK DISSECTION  04/12/2017   limited/notes 04/12/2017   RADICAL NECK DISSECTION N/A 04/12/2017   Procedure: RADICAL NECK DISSECTION;  Surgeon: Christia Reading, MD;  Location: [facility];  Service: ENT;  Laterality: N/A;  limited neck dissection 2 hours total   REDUCTION MAMMAPLASTY Bilateral 1998   RIGHT/LEFT HEART CATH AND CORONARY ANGIOGRAPHY N/A 03/12/2020   Procedure: RIGHT/LEFT HEART CATH AND CORONARY ANGIOGRAPHY;  Surgeon: Dolores Patty, MD;  Location: [facility];  Service: Cardiovascular;  Laterality: N/A;   [place]  1994   THORACIC AORTIC ENDOVASCULAR STENT GRAFT N/A 02/02/2023   Procedure: THORACIC AORTIC ENDOVASCULAR STENT GRAFT;  Surgeon: Victorino Sparrow, MD;  Location: [facility] OR;  Service: Vascular;  Laterality: N/A;   THYROIDECTOMY  04/12/2017   completion/notes 04/12/2017   THYROIDECTOMY N/A 04/12/2017   Procedure: THYROIDECTOMY;  Surgeon: Christia Reading, MD;  Location: [facility];  Service: ENT;  Laterality: N/A;  Completion Thyroidectomy   TOTAL THYROIDECTOMY  2010    Family History  Problem Relation Age of Onset   Hypertension Mother    Cancer Mother        laryngeal   Heart disease Mother        stent   Pancreatic cancer Father    Hypertension Father    Lung cancer Father 34       hx smoking   Hypertension Sister    Hypertension Sister    Hypertension Brother    Breast cancer Maternal Aunt 29   Breast cancer Maternal Aunt        dx 60+   Cancer Maternal Uncle        Lung CA   Breast cancer Paternal Aunt 47   Breast cancer Paternal Aunt        dx 47's   Breast cancer Paternal Aunt        dx 50's   Prostate cancer Paternal Uncle    Prostate cancer  Paternal Uncle    Lung cancer Paternal Uncle    Breast cancer Cousin 80   Breast cancer Cousin        dx <50   Breast cancer Cousin        dx <50   Breast cancer Cousin        dx <50   Heart disease Other    Hypertension Other    Stroke Other  Grandparent   Kidney disease Other        Grandparent   Diabetes Other        FH of Diabetes   Colon cancer Neg Hx    Esophageal cancer Neg Hx    Rectal cancer Neg Hx    Stomach cancer Neg Hx     Social History Social History   Tobacco Use   Smoking status: Former    Current packs/day: 0.00    Average packs/day: 0.5 packs/day for 15.0 years (7.5 ttl pk-yrs)    Types: Cigarettes    Start date: 04/12/2017    Quit date: 2019    Years since quitting: 5.6   Smokeless tobacco: Never  Vaping Use   Vaping status: Never Used  Substance Use Topics   Alcohol use: Yes    Alcohol/week: 0.0 standard drinks of alcohol    Comment: social   Drug use: No    Current Outpatient Medications  Medication Sig Dispense Refill   acetaminophen (TYLENOL) 500 MG tablet Take 1,000 mg by mouth every 6 (six) hours as needed for mild pain.     albuterol (VENTOLIN HFA) 108 (90 Base) MCG/ACT inhaler Inhale 2 puffs into the lungs every 6 (six) hours as needed for wheezing or shortness of breath. 8 g 0   allopurinol (ZYLOPRIM) 100 MG tablet TAKE 1 TABLET BY MOUTH EVERY DAY FOR GOUT PREVENTION 90 tablet 2   amLODipine (NORVASC) 10 MG tablet Take 10 mg by mouth daily.     AURYXIA 1 GM 210 MG(Fe) tablet Take 420 mg by mouth 3 (three) times daily.     B Complex-C-Folic Acid (DIALYVITE 800) 0.8 MG TABS Take 1 tablet by mouth daily.     Blood Glucose Monitoring Suppl (FREESTYLE LITE) w/Device KIT Inject 1 each into the skin daily. 1 kit 0   carvedilol (COREG) 25 MG tablet Take 1 tablet (25 mg total) by mouth 2 (two) times daily. 180 tablet 3   dolutegravir ([name]) 50 MG tablet Take 1 tablet (50 mg total) by mouth daily. 30 tablet 11   emtricitabine-tenofovir  AF (DESCOVY) 200-25 MG tablet Take 1 tablet by mouth daily. 30 tablet 11   fluticasone (FLOVENT HFA) 110 MCG/ACT inhaler Inhale 1 puff into the lungs 2 (two) times daily. TAKE 1 PUFF BY MOUTH TWICE A DAY Strength: 110 MCG/ACT 3 each 1   Lancets (ONETOUCH ULTRASOFT) lancets Use as instructed to test blood sugar daily 100 each 5   levocetirizine (XYZAL) 5 MG tablet Take 1 tablet (5 mg total) by mouth every evening. For allergies 90 tablet 3   levothyroxine (SYNTHROID) 175 MCG tablet TAKE 1 TABLET BY MOUTH EVERY DAY 30 tablet 7   metoCLOPramide (REGLAN) 5 MG tablet Take 1 tablet (5 mg total) by mouth every 6 (six) hours as needed for nausea. 20 tablet 0   minocycline (DYNACIN) 100 MG tablet Take 1 tablet (100 mg total) by mouth 2 (two) times daily. 60 tablet 11   omeprazole (PRILOSEC) 40 MG capsule Take 1 capsule (40 mg total) by mouth 2 (two) times daily before a meal. 180 capsule 0   ondansetron (ZOFRAN) 4 MG tablet Take 1 tablet (4 mg total) by mouth daily as needed for nausea or vomiting. 20 tablet 0   rosuvastatin (CRESTOR) 10 MG tablet TAKE 1 TABLET BY MOUTH EVERY DAY FOR CHOLESTEROL (Patient taking differently: Take 10 mg by mouth daily. FOR CHOLESTEROL) 90 tablet 2   saxagliptin HCl (ONGLYZA) 2.5 MG TABS tablet Take 1  tablet (2.5 mg total) by mouth daily. for diabetes. 90 tablet 1   traMADol (ULTRAM) 50 MG tablet Take 1 tablet (50 mg total) by mouth every 6 (six) hours as needed for moderate pain. 20 tablet 0   losartan (COZAAR) 50 MG tablet Take 1 tablet (50 mg total) by mouth daily. for blood pressure. 90 tablet 3   No current facility-administered medications for this visit.    Allergies  Allergen Reactions   Genvoya [Elviteg-Cobic-Emtricit-Tenofaf] Hives   Lisinopril Cough   Tape Rash    Rash  Okay with tegaderm and paper tape   Aldactone [Spironolactone] Hives   Tegaderm Ag Mesh [Silver] Itching    Review of Systems  Constitutional:  Positive for activity change. Negative for  chills, fatigue and fever.  HENT: Negative.    Eyes: Negative.   Respiratory:  Negative for shortness of breath.   Cardiovascular:  Negative for chest pain and leg swelling.  Gastrointestinal: Negative.   Endocrine: Negative.   Genitourinary: Negative.   Musculoskeletal: Negative.   Neurological:  Positive for weakness. Negative for dizziness, tremors, seizures, syncope, speech difficulty, light-headedness, numbness and headaches.  Hematological: Negative.   Psychiatric/Behavioral: Negative.      BP 112/62 (BP Location: Left Arm, Patient Position: Sitting)   Pulse 70   Resp 20   Ht 5\' 5"  (1.651 m)   Wt 179 lb (81.2 kg)   LMP 03/31/2014 (LMP Unknown)   SpO2 99% Comment: RA  BMI 29.79 kg/m  Physical Exam Constitutional:      Appearance: Normal appearance. She is normal weight.  HENT:     Head: Normocephalic and atraumatic.  Eyes:     Extraocular Movements: Extraocular movements intact.     Conjunctiva/sclera: Conjunctivae normal.     Pupils: Pupils are equal, round, and reactive to light.  Neck:     Vascular: No carotid bruit.  Cardiovascular:     Rate and Rhythm: Normal rate and regular rhythm.     Pulses: Normal pulses.     Heart sounds: Normal heart sounds. No murmur heard. Pulmonary:     Effort: Pulmonary effort is normal.     Breath sounds: Normal breath sounds.  Abdominal:     General: Bowel sounds are normal. There is no distension.     Tenderness: There is no abdominal tenderness.  Musculoskeletal:        General: No swelling.     Cervical back: No tenderness.  Skin:    General: Skin is warm and dry.  Neurological:     General: No focal deficit present.     Mental Status: She is alert and oriented to person, place, and time.  Psychiatric:        Mood and Affect: Mood normal.        Behavior: Behavior normal.      Diagnostic Tests: Narrative & Impression  CLINICAL DATA:  Thoracoabdominal aortic dissection status post TEVAR.   EXAM: CT ANGIOGRAPHY  CHEST, ABDOMEN AND PELVIS   TECHNIQUE: Non-contrast CT of the chest was initially obtained.   Multidetector CT imaging through the chest, abdomen and pelvis was performed using the standard protocol during bolus administration of intravenous contrast. Multiplanar reconstructed images and MIPs were obtained and reviewed to evaluate the vascular anatomy.   RADIATION DOSE REDUCTION: This exam was performed according to the departmental dose-optimization program which includes automated exposure control, adjustment of the mA and/or kV according to patient size and/or use of iterative reconstruction technique.   CONTRAST:  OMNIPAQUE  IOHEXOL 350 MG/ML SOLN   COMPARISON:  Prior CT scan of the chest abdomen and pelvis 02/07/2023   FINDINGS: CTA CHEST FINDINGS   Cardiovascular: 2 vessel aortic arch. The right brachiocephalic and left common carotid artery share a common origin. Significant interval change in the appearance of the proximal portion of the previously placed thoracic stent graft. The stent graft has significantly increased in diameter from approximately 2.6 cm to 4.7 cm. Additionally, there is new ulceration along the undersurface of the aortic isthmus at the proximal landing zone. The ulceration measures approximately 2.1 x 1.7 x 0.7 cm. The stent grafts extend inferiorly to the aortic isthmus. There is some persistent retrograde filling of the false lumen in the descending thoracic aorta. Aneurysmal dilation measures 4.8 cm.   Right IJ tunneled hemodialysis catheter in good position with the tip in the right atrium. The main pulmonary artery is normal in size. The heart is normal in size. Trace pericardial effusion.   Mediastinum/Nodes: Unremarkable CT appearance of the thyroid gland. No suspicious mediastinal or hilar adenopathy. No soft tissue mediastinal mass. The thoracic esophagus is unremarkable.   Lungs/Pleura: Stable mild subpleural reticulation and  linear scarring in the anterior right upper lobe and right middle lobe. Trace dependent atelectasis bilaterally. No suspicious pulmonary mass or nodule.   Musculoskeletal: No acute fracture or aggressive appearing lytic or blastic osseous lesion.   Review of the MIP images confirms the above findings.   CTA ABDOMEN AND PELVIS FINDINGS   VASCULAR   Aorta: Dissection flap extends throughout the abdominal aorta as previously seen. Increasing diameter of the dissected aorta in the visceral segment measuring 3.6 cm at the origin the celiac artery compared to 3.2 cm previously. The aorta then tapers to normal caliber.   Celiac: Arises from the true lumen. The dissection extends just into the origin of the celiac artery but does not result in flow limitation.   SMA: Arises from the true lumen.  No involvement.   Renals: The right renal artery arises from the true lumen. The left renal artery is partially involved by the dissection flap and there is slightly decreased flow as previously noted. There appears to be some interval healing. The artery remains patent.   IMA: Patent without evidence of aneurysm, dissection, vasculitis or significant stenosis.   Inflow: Dissection flap extends into the iliac system on the left. The dissection extends all the way into the femoral system. No involvement on the right. No propagation.   Veins: No focal venous abnormality.   Review of the MIP images confirms the above findings.   NON-VASCULAR   Hepatobiliary: Normal hepatic contour and morphology. No discrete hepatic lesion. No intra or extrahepatic biliary ductal dilatation. Focal thickening of the gallbladder wall where it abuts the liver parenchyma measures 1.7 x 1.3 cm. This has previously been evaluated by MRI and found to represent focal adenomyomatosis.   Pancreas: Unremarkable. No pancreatic ductal dilatation or surrounding inflammatory changes.   Spleen: Normal in size  without focal abnormality.   Adrenals/Urinary Tract: Mild adreniform thickening of the left gland. The right gland is normal. Diffuse mild cortical atrophy of the right kidney. Mild caliectasis in the lower pole of the right kidney with a cluster of small nonobstructing stones again noted. No enhancing renal mass. The ureters and bladder are unremarkable.   Stomach/Bowel: No focal bowel wall thickening or evidence of obstruction. Colonic diverticular disease without CT evidence of active inflammation.   Lymphatic: No suspicious lymphadenopathy.   Reproductive: Uterus  and bilateral adnexa are unremarkable.   Other: Small fat containing umbilical hernia.  No ascites.   Musculoskeletal: No acute fracture or aggressive appearing lytic or blastic osseous lesion.   Review of the MIP images confirms the above findings.   IMPRESSION: 1. Interval change in the appearance and configuration of the most proximal thoracic aortic stent graft. The stent graft has expanded from 2.6 cm in diameter in April of 2024 to 4.7 cm in diameter on today's examination. Additionally, there is a new focal outpouching of contrast material along the aortic infundibulum at the proximal landing zone concerning for ulceration/pseudoaneurysm development. 2. Persistent retrograde filling of the false lumen with mildly increased size of the descending thoracic aorta to 4.8 cm. 3. Similarly, there is a mild enlargement of the visceral aorta now measuring up to 3.6 cm compared to 3.2 cm previously at the origin of the celiac artery. 4. Similar extent of abdominal aortic dissection flap extending through the iliac system on the left and into the common femoral artery. 5. Slight interval improvement in the appearance of the dissection involvement of the left renal artery. There is persistent stenosis but the artery remains patent and the flow channel seems to be improving. 6. Additional ancillary findings as above  without interval change.   These results were called by telephone at the time of interpretation on 05/09/2023 at 9:13 am to provider Dr. Lerry Paterson, Who verbally acknowledged these results.   Signed,   Sterling Big, MD, RPVI   [facility]   [facility]     Electronically Signed   By: Malachy Moan M.D.   On: 05/09/2023 09:13      Impression:  She has had some slippage of her thoracic stent graft from zone 2 into zone 3 with aneurysmal degeneration of the aortic arch proximal to the stent graft with possible pseudoaneurysm formation.  It is also possible that this area is the initial tear from her aortic dissection but it is not clear.  There is significant bird beaking of the proximal end of the stent graft.  There is persistent retrograde filling of the false lumen with mildly increased size of the descending thoracic aorta at 4.8 cm.  It does not appear that the distal end of the stent graft is fully expanded due to the intimal flap.  I agree with Dr. Karin Lieu that the best option is to proceed with aortic arch de-branching via median sternotomy with bypasses to the innominate and left common carotid arteries followed by stent grafting across the aortic arch.  Dr. Sherral Hammers will then decide whether further intervention is needed for the distal portion of the stent graft and the retrograde filling of the false lumen.  I think her operative risk is somewhat increased due to her comorbid risk factors including renal failure on dialysis and diffuse vascular disease.  Cardiac catheterization in 2021 showed normal coronary arteries with normal left ventricular systolic function.  2D echocardiogram in April 2024 showed an ejection fraction of 55 to 60% with mild concentric LVH and grade 2 diastolic dysfunction.  There is no significant aortic valve disease.  There is mild to moderate mitral valve regurgitation. I discussed the operative  procedure with the patient including alternatives, benefits and risks; including but not limited to bleeding, blood transfusion, infection, stroke, myocardial infarction, graft failure, heart block requiring a permanent pacemaker, organ dysfunction, paresis or paralysis of the lower extremities, and death.  [name] understands and agrees to  proceed.      Plan:  We will coordinate surgery with Dr. Sherral Hammers in the near future including median sternotomy for aortic arch de-branching and stent grafting across the aortic arch.  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   [name], MD [facility] [phone]

## 2023-06-23 ENCOUNTER — Other Ambulatory Visit: Payer: Self-pay | Admitting: *Deleted

## 2023-06-23 ENCOUNTER — Other Ambulatory Visit: Payer: Self-pay

## 2023-06-23 ENCOUNTER — Encounter: Payer: Self-pay | Admitting: *Deleted

## 2023-06-23 ENCOUNTER — Other Ambulatory Visit: Payer: Self-pay | Admitting: Primary Care

## 2023-06-23 DIAGNOSIS — N186 End stage renal disease: Secondary | ICD-10-CM | POA: Diagnosis not present

## 2023-06-23 DIAGNOSIS — I7103 Dissection of thoracoabdominal aorta: Secondary | ICD-10-CM

## 2023-06-23 DIAGNOSIS — Z992 Dependence on renal dialysis: Secondary | ICD-10-CM | POA: Diagnosis not present

## 2023-06-23 DIAGNOSIS — N2581 Secondary hyperparathyroidism of renal origin: Secondary | ICD-10-CM | POA: Diagnosis not present

## 2023-06-23 DIAGNOSIS — J4530 Mild persistent asthma, uncomplicated: Secondary | ICD-10-CM

## 2023-06-26 DIAGNOSIS — N2581 Secondary hyperparathyroidism of renal origin: Secondary | ICD-10-CM | POA: Diagnosis not present

## 2023-06-26 DIAGNOSIS — N186 End stage renal disease: Secondary | ICD-10-CM | POA: Diagnosis not present

## 2023-06-26 DIAGNOSIS — Z992 Dependence on renal dialysis: Secondary | ICD-10-CM | POA: Diagnosis not present

## 2023-06-28 DIAGNOSIS — Z992 Dependence on renal dialysis: Secondary | ICD-10-CM | POA: Diagnosis not present

## 2023-06-28 DIAGNOSIS — N2581 Secondary hyperparathyroidism of renal origin: Secondary | ICD-10-CM | POA: Diagnosis not present

## 2023-06-28 DIAGNOSIS — N186 End stage renal disease: Secondary | ICD-10-CM | POA: Diagnosis not present

## 2023-06-30 DIAGNOSIS — Z992 Dependence on renal dialysis: Secondary | ICD-10-CM | POA: Diagnosis not present

## 2023-06-30 DIAGNOSIS — N2581 Secondary hyperparathyroidism of renal origin: Secondary | ICD-10-CM | POA: Diagnosis not present

## 2023-06-30 DIAGNOSIS — N186 End stage renal disease: Secondary | ICD-10-CM | POA: Diagnosis not present

## 2023-07-03 DIAGNOSIS — Z992 Dependence on renal dialysis: Secondary | ICD-10-CM | POA: Diagnosis not present

## 2023-07-03 DIAGNOSIS — N186 End stage renal disease: Secondary | ICD-10-CM | POA: Diagnosis not present

## 2023-07-03 DIAGNOSIS — N2581 Secondary hyperparathyroidism of renal origin: Secondary | ICD-10-CM | POA: Diagnosis not present

## 2023-07-05 DIAGNOSIS — N2581 Secondary hyperparathyroidism of renal origin: Secondary | ICD-10-CM | POA: Diagnosis not present

## 2023-07-05 DIAGNOSIS — Z992 Dependence on renal dialysis: Secondary | ICD-10-CM | POA: Diagnosis not present

## 2023-07-05 DIAGNOSIS — N186 End stage renal disease: Secondary | ICD-10-CM | POA: Diagnosis not present

## 2023-07-06 DIAGNOSIS — H524 Presbyopia: Secondary | ICD-10-CM | POA: Diagnosis not present

## 2023-07-06 DIAGNOSIS — H52223 Regular astigmatism, bilateral: Secondary | ICD-10-CM | POA: Diagnosis not present

## 2023-07-07 DIAGNOSIS — Z992 Dependence on renal dialysis: Secondary | ICD-10-CM | POA: Diagnosis not present

## 2023-07-07 DIAGNOSIS — N186 End stage renal disease: Secondary | ICD-10-CM | POA: Diagnosis not present

## 2023-07-07 DIAGNOSIS — N2581 Secondary hyperparathyroidism of renal origin: Secondary | ICD-10-CM | POA: Diagnosis not present

## 2023-07-09 ENCOUNTER — Other Ambulatory Visit: Payer: Self-pay | Admitting: Internal Medicine

## 2023-07-10 DIAGNOSIS — N2581 Secondary hyperparathyroidism of renal origin: Secondary | ICD-10-CM | POA: Diagnosis not present

## 2023-07-10 DIAGNOSIS — N186 End stage renal disease: Secondary | ICD-10-CM | POA: Diagnosis not present

## 2023-07-10 DIAGNOSIS — Z992 Dependence on renal dialysis: Secondary | ICD-10-CM | POA: Diagnosis not present

## 2023-07-10 NOTE — Pre-Procedure Instructions (Signed)
Surgical Instructions   Your procedure is scheduled on July 13, 2023. Report to [facility] Main Entrance "A" at 5:30 A.M., then check in with the Admitting office. Any questions or running late day of surgery: call [phone]  Questions prior to your surgery date: call [phone], Monday-Friday, 8am-4pm. If you experience any cold or flu symptoms such as cough, fever, chills, shortness of breath, etc. between now and your scheduled surgery, please notify us at the above number.     Remember:  Do not eat or drink after midnight the night before your surgery    Take these medicines the morning of surgery with A SIP OF WATER: allopurinol (ZYLOPRIM)  amLODipine (NORVASC)  carvedilol (COREG)  dolutegravir ([name])  emtricitabine-tenofovir AF (DESCOVY)  fluticasone (FLOVENT HFA) inhaler  levothyroxine (SYNTHROID)  minocycline (DYNACIN)  omeprazole (PRILOSEC)  rosuvastatin (CRESTOR)    May take these medicines IF NEEDED: acetaminophen (TYLENOL)  albuterol (VENTOLIN HFA) inhaler  metoCLOPramide (REGLAN)  ondansetron (ZOFRAN)  traMADol (ULTRAM)    Continue to take your Aspirin through the day before surgery. DO NOT take any the morning of surgery.   One week prior to surgery, STOP taking any Aleve, Naproxen, Ibuprofen, Motrin, Advil, Goody's, BC's, all herbal medications, fish oil, and non-prescription vitamins.   WHAT DO I DO ABOUT MY DIABETES MEDICATION?   Do not take saxagliptin HCl (ONGLYZA) the morning of surgery.   HOW TO MANAGE YOUR DIABETES BEFORE AND AFTER SURGERY  Why is it important to control my blood sugar before and after surgery? Improving blood sugar levels before and after surgery helps healing and can limit problems. A way of improving blood sugar control is eating a healthy diet by:  Eating less sugar and carbohydrates  Increasing activity/exercise  Talking with your doctor about reaching your blood sugar goals High blood sugars (greater  than 180 mg/dL) can raise your risk of infections and slow your recovery, so you will need to focus on controlling your diabetes during the weeks before surgery. Make sure that the doctor who takes care of your diabetes knows about your planned surgery including the date and location.  How do I manage my blood sugar before surgery? Check your blood sugar at least 4 times a day, starting 2 days before surgery, to make sure that the level is not too high or low.  Check your blood sugar the morning of your surgery when you wake up and every 2 hours until you get to the Short Stay unit.  If your blood sugar is less than 70 mg/dL, you will need to treat for low blood sugar: Do not take insulin. Treat a low blood sugar (less than 70 mg/dL) with  cup of clear juice (cranberry or apple), 4 glucose tablets, OR glucose gel. Recheck blood sugar in 15 minutes after treatment (to make sure it is greater than 70 mg/dL). If your blood sugar is not greater than 70 mg/dL on recheck, call [phone] for further instructions. Report your blood sugar to the short stay nurse when you get to Short Stay.  If you are admitted to the hospital after surgery: Your blood sugar will be checked by the staff and you will probably be given insulin after surgery (instead of oral diabetes medicines) to make sure you have good blood sugar levels. The goal for blood sugar control after surgery is 80-180 mg/dL.                      Do NOT Smoke (  Tobacco/Vaping) for 24 hours prior to your procedure.  If you use a CPAP at night, you may bring your mask/headgear for your overnight stay.   You will be asked to remove any contacts, glasses, piercing's, hearing aid's, dentures/partials prior to surgery. Please bring cases for these items if needed.    Patients discharged the day of surgery will not be allowed to drive home, and someone needs to stay with them for 24 hours.  SURGICAL WAITING ROOM VISITATION Patients may have no  more than 2 support people in the waiting area - these visitors may rotate.   Pre-op nurse will coordinate an appropriate time for 1 ADULT support person, who may not rotate, to accompany patient in pre-op.  Children under the age of 6 must have an adult with them who is not the patient and must remain in the main waiting area with an adult.  If the patient needs to stay at the hospital during part of their recovery, the visitor guidelines for inpatient rooms apply.  Please refer to the [facility] website for the visitor guidelines for any additional information.   If you received a COVID test during your pre-op visit  it is requested that you wear a mask when out in public, stay away from anyone that may not be feeling well and notify your surgeon if you develop symptoms. If you have been in contact with anyone that has tested positive in the last 10 days please notify you surgeon.      Pre-operative CHG Bathing Instructions   You can play a key role in reducing the risk of infection after surgery. Your skin needs to be as free of germs as possible. You can reduce the number of germs on your skin by washing with CHG (chlorhexidine gluconate) soap before surgery. CHG is an antiseptic soap that kills germs and continues to kill germs even after washing.   DO NOT use if you have an allergy to chlorhexidine/CHG or antibacterial soaps. If your skin becomes reddened or irritated, stop using the CHG and notify one of our RNs at [phone].              TAKE A SHOWER THE NIGHT BEFORE SURGERY AND THE DAY OF SURGERY    Please keep in mind the following:  DO NOT shave, including legs and underarms, 48 hours prior to surgery.   You may shave your face before/day of surgery.  Place clean sheets on your bed the night before surgery Use a clean washcloth (not used since being washed) for each shower. DO NOT sleep with pet's night before surgery.  CHG Shower Instructions:  Wash your face and  private area with normal soap. If you choose to wash your hair, wash first with your normal shampoo.  After you use shampoo/soap, rinse your hair and body thoroughly to remove shampoo/soap residue.  Turn the water OFF and apply half the bottle of CHG soap to a CLEAN washcloth.  Apply CHG soap ONLY FROM YOUR NECK DOWN TO YOUR TOES (washing for 3-5 minutes)  DO NOT use CHG soap on face, private areas, open wounds, or sores.  Pay special attention to the area where your surgery is being performed.  If you are having back surgery, having someone wash your back for you may be helpful. Wait 2 minutes after CHG soap is applied, then you may rinse off the CHG soap.  Pat dry with a clean towel  Put on clean pajamas    Additional  instructions for the day of surgery: DO NOT APPLY any lotions, deodorants, cologne, or perfumes.   Do not wear jewelry or makeup Do not wear nail polish, gel polish, artificial nails, or any other type of covering on natural nails (fingers and toes) Do not bring valuables to the hospital. [facility] is not responsible for valuables/personal belongings. Put on clean/comfortable clothes.  Please brush your teeth.  Ask your nurse before applying any prescription medications to the skin.

## 2023-07-11 ENCOUNTER — Ambulatory Visit (HOSPITAL_COMMUNITY)
Admission: RE | Admit: 2023-07-11 | Discharge: 2023-07-11 | Disposition: A | Discharge status: Home / Self Care | Payer: Medicare HMO | Source: Ambulatory Visit | Attending: Surgery | Admitting: Surgery

## 2023-07-11 ENCOUNTER — Other Ambulatory Visit: Payer: Self-pay

## 2023-07-11 ENCOUNTER — Encounter (HOSPITAL_COMMUNITY): Payer: Self-pay

## 2023-07-11 ENCOUNTER — Encounter (HOSPITAL_COMMUNITY)
Admission: RE | Admit: 2023-07-11 | Discharge: 2023-07-11 | Disposition: A | Discharge status: Home / Self Care | Payer: Medicare HMO | Source: Ambulatory Visit | Attending: Surgery

## 2023-07-11 ENCOUNTER — Other Ambulatory Visit (HOSPITAL_COMMUNITY): Payer: Medicare HMO

## 2023-07-11 VITALS — BP 129/51 | HR 71 | Temp 98.6°F | Resp 18 | Ht 65.0 in | Wt 181.7 lb

## 2023-07-11 DIAGNOSIS — I7103 Dissection of thoracoabdominal aorta: Secondary | ICD-10-CM

## 2023-07-11 DIAGNOSIS — E119 Type 2 diabetes mellitus without complications: Secondary | ICD-10-CM | POA: Diagnosis not present

## 2023-07-11 DIAGNOSIS — Z1152 Encounter for screening for COVID-19: Secondary | ICD-10-CM | POA: Diagnosis not present

## 2023-07-11 DIAGNOSIS — Z01818 Encounter for other preprocedural examination: Secondary | ICD-10-CM | POA: Insufficient documentation

## 2023-07-11 DIAGNOSIS — Z992 Dependence on renal dialysis: Secondary | ICD-10-CM | POA: Diagnosis not present

## 2023-07-11 DIAGNOSIS — E785 Hyperlipidemia, unspecified: Secondary | ICD-10-CM | POA: Insufficient documentation

## 2023-07-11 DIAGNOSIS — I10 Essential (primary) hypertension: Secondary | ICD-10-CM | POA: Diagnosis not present

## 2023-07-11 DIAGNOSIS — Z95828 Presence of other vascular implants and grafts: Secondary | ICD-10-CM | POA: Diagnosis not present

## 2023-07-11 LAB — URINALYSIS, ROUTINE W REFLEX MICROSCOPIC
Bilirubin Urine: NEGATIVE
Glucose, UA: NEGATIVE mg/dL
Hgb urine dipstick: NEGATIVE
Ketones, ur: NEGATIVE mg/dL
Nitrite: NEGATIVE
Protein, ur: >=300 mg/dL — AB
Specific Gravity, Urine: 1.013 (ref 1.005–1.030)
pH: 5 (ref 5.0–8.0)

## 2023-07-11 LAB — COMPREHENSIVE METABOLIC PANEL
ALT: 8 U/L (ref 0–44)
AST: 13 U/L — ABNORMAL LOW (ref 15–41)
Albumin: 2.8 g/dL — ABNORMAL LOW (ref 3.5–5.0)
Alkaline Phosphatase: 105 U/L (ref 38–126)
Anion gap: 11 (ref 5–15)
BUN: 19 mg/dL (ref 6–20)
CO2: 29 mmol/L (ref 22–32)
Calcium: 8.6 mg/dL — ABNORMAL LOW (ref 8.9–10.3)
Chloride: 96 mmol/L — ABNORMAL LOW (ref 98–111)
Creatinine, Ser: 4.54 mg/dL — ABNORMAL HIGH (ref 0.44–1.00)
GFR, Estimated: 11 mL/min — ABNORMAL LOW (ref >60)
Glucose, Bld: 135 mg/dL — ABNORMAL HIGH (ref 70–99)
Potassium: 3.4 mmol/L — ABNORMAL LOW (ref 3.5–5.1)
Sodium: 136 mmol/L (ref 135–145)
Total Bilirubin: 0.2 mg/dL — ABNORMAL LOW (ref 0.3–1.2)
Total Protein: 7.8 g/dL (ref 6.5–8.1)

## 2023-07-11 LAB — CBC
HCT: 34.9 % — ABNORMAL LOW (ref 36.0–46.0)
Hemoglobin: 10.5 g/dL — ABNORMAL LOW (ref 12.0–15.0)
MCH: 29.3 pg (ref 26.0–34.0)
MCHC: 30.1 g/dL (ref 30.0–36.0)
MCV: 97.5 fL (ref 80.0–100.0)
Platelets: 324 10*3/uL (ref 150–400)
RBC: 3.58 MIL/uL — ABNORMAL LOW (ref 3.87–5.11)
RDW: 17.3 % — ABNORMAL HIGH (ref 11.5–15.5)
WBC: 7.9 10*3/uL (ref 4.0–10.5)
nRBC: 0 % (ref 0.0–0.2)

## 2023-07-11 LAB — PROTIME-INR
INR: 1.1 (ref 0.8–1.2)
Prothrombin Time: 14.5 seconds (ref 11.4–15.2)

## 2023-07-11 LAB — SURGICAL PCR SCREEN
MRSA, PCR: NEGATIVE
Staphylococcus aureus: NEGATIVE

## 2023-07-11 LAB — GLUCOSE, CAPILLARY: Glucose-Capillary: 181 mg/dL — ABNORMAL HIGH (ref 70–99)

## 2023-07-11 LAB — TYPE AND SCREEN
ABO/RH(D): O POS
Antibody Screen: NEGATIVE

## 2023-07-11 LAB — HEMOGLOBIN A1C
Hgb A1c MFr Bld: 5.2 % (ref 4.8–5.6)
Mean Plasma Glucose: 102.54 mg/dL

## 2023-07-11 LAB — APTT: aPTT: 29 seconds (ref 24–36)

## 2023-07-11 NOTE — Progress Notes (Signed)
PCP - Tilman Neat Cardiologist - none  PPM/ICD - denies Device Orders -  Rep Notified -   Chest x-ray - 07/11/23 EKG - 07/11/23 Stress Test - 11/03/17 ECHO - 02/21/23 Cardiac Cath - 03/12/20  Sleep Study - 10/21 CPAP -   Fasting Blood Sugar - 110 Checks Blood Sugar 2 times a day  Last dose of GLP1 agonist-   GLP1 instructions:   Blood Thinner Instructions: Aspirin Instructions:  ERAS Protcol -no PRE-SURGERY Ensure or G2-   COVID TEST- na   Anesthesia review: yes. Pt has history of supprative adenitis. Pt has oozing boils under breasts. This information was also relayed to [employer]. Anesthesia review for EKG with new LBBB.  Patient denies shortness of breath, fever, cough and chest pain at PAT appointment   All instructions explained to the patient, with a verbal understanding of the material. Patient agrees to go over the instructions while at home for a better understanding. Patient also instructed to wear a mask when out in public prior to surgery.  The opportunity to ask questions was provided.

## 2023-07-11 NOTE — Progress Notes (Signed)
Pt informed staff that she had a boil under her right breast that popped prior to her PAT appointment. This RN was asked to help re-dress the area since pt had placed toilet paper over boil temporarily until she got home. Old dressing was puss and blood mixed together. Boil was located under right breast towards her sternum. Boil was cleaned with NS and gauze. Clean dressing was then applied. Pt also sent home with additional dressing supplies so that she can change again if needed. Darius Bump, RN with [facility] notified and will let Dr. Laneta Simmers know.

## 2023-07-12 ENCOUNTER — Encounter: Payer: Self-pay | Admitting: *Deleted

## 2023-07-12 ENCOUNTER — Other Ambulatory Visit: Payer: Self-pay | Admitting: *Deleted

## 2023-07-12 DIAGNOSIS — N186 End stage renal disease: Secondary | ICD-10-CM | POA: Diagnosis not present

## 2023-07-12 DIAGNOSIS — I7103 Dissection of thoracoabdominal aorta: Secondary | ICD-10-CM

## 2023-07-12 DIAGNOSIS — Z992 Dependence on renal dialysis: Secondary | ICD-10-CM | POA: Diagnosis not present

## 2023-07-12 DIAGNOSIS — N2581 Secondary hyperparathyroidism of renal origin: Secondary | ICD-10-CM | POA: Diagnosis not present

## 2023-07-12 LAB — SARS CORONAVIRUS 2 (TAT 6-24 HRS): SARS Coronavirus 2: NEGATIVE

## 2023-07-12 NOTE — Hospital Course (Addendum)
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  Dr. Sherral Hammers reviewed the scans and felt that placement of another endovascular stent would first require de-branching of the aortic arch to allow the stent graft to cross the entire aortic arch with an adequate proximal landing zone.   The patient tells me that she has had no chest or back pain.  She has been doing well with dialysis.  She does report some vague symptoms in her lower extremities that she describes as severe pain and inability to move her extremities.  She said this is not related to any specific activity and not related to dialysis.  She is still able to stand on her legs but does not feel like she can move.  She usually has to hold onto something or sit down.  It involves both legs.  She reports the pain as being the whole way up and down her legs.  Dr. Laneta Simmers agreed with Dr. Sherral Hammers (Vascular Surgeon) that the best option is to proceed with aortic arch de-branching via median sternotomy with bypasses to the innominate and left common carotid arteries followed by stent grafting across the aortic arch.  Dr. Sherral Hammers will then decide whether further intervention is needed for the distal portion of  the stent graft and the retrograde filling of the false lumen. Potential risks, benefits, and complications of the surgery were discussed with the patient and she agreed to proceed with surgery.  Hospital Course: Ms. Furtak was taken to the operating room on 08/25/2023 where aortic arch deep branching along with aortic innominate artery, aorta and left carotid artery, and aorta left subclavian artery bypasses were performed by Dr. Laneta Simmers and Dr. Karin Lieu.  Following the procedure, she was transported to the surgical ICU in stable condition.  The nephrology team was consulted early postoperatively for continuity of care for her end-stage renal disease and resumption of dialysis.  Patient was weaned from ventilator support and extubated by around 12 noon on the first postoperative day.  Hemodialysis was resumed on postop day 2.  The lumbar drain was removed on postop day 2.  Her chest tubes were removed without difficulty.  She is deconditioned and was evaluated by PT/OT.  They recommended home health PT/OT.  She is maintaining NSR.  Her neck line was removed on 08/29/2023.  She was felt stable for transfer to the progressive care unit on 08/30/2023.  She developed DVT in the left internal jugular, left brachial, axillary, and subclavian veins felt to be related trauma/compression of brachiocephalic vein from surgery and branch grafts running under the vein.  She was started on Heparin and will be transitioned to Apixiban.  She is diuresing well and her weight has returned to baseline.  She has some pain along her back/shoulder blades  which is not unexpected with procedure she underwent.  Her surgical incisions are healing without evidence of infection.  She is stable for discharge for home today.

## 2023-07-14 DIAGNOSIS — N186 End stage renal disease: Secondary | ICD-10-CM | POA: Diagnosis not present

## 2023-07-14 DIAGNOSIS — N2581 Secondary hyperparathyroidism of renal origin: Secondary | ICD-10-CM | POA: Diagnosis not present

## 2023-07-14 DIAGNOSIS — Z992 Dependence on renal dialysis: Secondary | ICD-10-CM | POA: Diagnosis not present

## 2023-07-14 NOTE — Progress Notes (Signed)
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 Surgery was initially scheduled for 07/13/23, but she developed what sounds like an exacerbation of breat hidradenitis suppurativa. Case rescheduled for 08/10/23 with new PAT scheduled for 08/08/23. She is s/p TEVAR on 02/02/23 for type B aortic dissection. CTA chest/abd/pelvis on 05/04/23 showed some slipping of the endovascular graft with aneurysmal dilation of the aorta. Placement of another endovascular stent recommended but would first require de-branching of the aortic arch to allow the stent graft to cross the entire aortic arch with an adequate proximal landing zone.   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   07/11/23 EKG showed LBBB. This was not noted on her 02/05/23 EKG, although by telemetry monitoring she did have NSR, BBB on 01/23/23 rhythm strips. She had normal coronaries by [facility] on 03/12/20.01/30/23 echo showed LVEF 55-60%,  no regional wall motion abnormalities, mild concentric LVH, grade II diastolic dysfunction, normal RV systolic function, mild-moderate MR, abdominal aorta flap also noted concerning for dissection (s/p TEVAR 02/02/23). EKG reviewed with anesthesiologist Jairo Ben, MD.   Additional input pending 08/08/23 PAT visit.   VS: LMP 03/31/2014 (LMP Unknown)    PROVIDERS: Doreene Nest, NP is PCP  - Gerarda Fraction, MD is vascular surgeon - Evelene Croon, MD is CT surgeon - She was previously followed by HF cardiologist Arvilla Meres, MD at the [facility], last visit 08/31/21 for EF monitoring during chemotherapy.  EF 55-60% by 02/2020 echo. Normal coronaries in 2021. As needed follow-up recommended.  Dorothy Puffer, MD is RAD-ONC Rachel Moulds, MD is HEM-ONC - Cliffton Asters, MD is ID - Zetta Bills, MD is nephrologist - Romero Belling, MD is endocrinologist. Last visit 11/02/21.    LABS: Pending 08/08/23 PAT visit. A1c 5.2% on 07/11/23.  (all labs ordered are listed, but only abnormal results are displayed)  Labs Reviewed - No data to display   IMAGES: CXR 07/11/23: In process.  CTA Chest/abd/pelvis 05/04/23: IMPRESSION: 1. Interval change in the appearance and configuration of the most proximal thoracic aortic stent graft. The stent graft has expanded from 2.6 cm in diameter in April of 2024 to 4.7 cm in diameter on today's examination. Additionally, there is a new focal outpouching of contrast material along the aortic infundibulum at the proximal landing zone concerning for ulceration/pseudoaneurysm development. 2. Persistent retrograde filling of the false lumen with mildly increased size of the descending thoracic aorta to 4.8 cm. 3. Similarly, there is a mild enlargement of the visceral aorta now measuring up to 3.6 cm compared to  3.2 cm previously at the origin of the celiac artery. 4. Similar extent of abdominal aortic dissection flap extending through  the iliac system on the left and into the common femoral artery. 5. Slight interval improvement in the appearance of the dissection involvement of the left renal artery. There is persistent stenosis but the artery remains patent and the flow channel seems to be improving. 6. Additional ancillary findings as above without interval change.    US Renal Artery 01/30/23: Summary:  Renal:  Right: Patent right renal artery with high resistance waveforms         noted throughout without signifcant stenosis. Abnormal right         resisitive index with increased echogenicity within the renal         parenchyma.  Left:  Patent left renal artery with high resistance waveforms noted         throughout without signifcant stenosis. Abnormal left         resisitive index with increased echogenicity within the renal         parenchyma.  Mesenteric:  Aortic dissection was noted.    EKG: EKG 07/11/23: Normal sinus rhythm Left axis deviation Left bundle branch block Abnormal ECG When compared with ECG of 05-Feb-2023 08:28, LBBB now seen Confirmed by Alverda Skeans ([id number]) on 07/11/2023 3:47:27 PM  EKG 02/05/23: Normal sinus rhythm Minimal voltage criteria for LVH, may be normal variant ( Cornell product ) Borderline ECG When compared with ECG of 29-Jan-2023 02:58, No significant change since last tracing Confirmed by Kristeen Miss [phone]) on 02/05/2023 10:49:25 AM   CV: US Carotid 07/11/23: Summary:  - Right Carotid: Velocities in the right ICA are consistent with a 1-39% stenosis.  - Left Carotid: Velocities in the left ICA are consistent with a 1-39% stenosis.  - Vertebrals: Bilateral vertebral arteries demonstrate antegrade flow.  - Subclavians: Normal flow hemodynamics were seen in bilateral subclavian arteries.   Echo 01/30/23: IMPRESSIONS   1. Left ventricular ejection fraction, by estimation, is 55 to 60%. The  left ventricle has normal function. The left ventricle has no regional   wall motion abnormalities. There is mild concentric left ventricular  hypertrophy. Left ventricular diastolic  parameters are consistent with Grade II diastolic dysfunction  (pseudonormalization).   2. Right ventricular systolic function is normal. The right ventricular  size is normal. Tricuspid regurgitation signal is inadequate for assessing  PA pressure.   3. Left atrial size was mildly dilated.   4. The mitral valve is normal in structure. Mild to moderate mitral valve  regurgitation. No evidence of mitral stenosis.   5. The aortic valve is calcified. Aortic valve regurgitation is not  visualized. Aortic valve sclerosis/calcification is present, without any  evidence of aortic stenosis.   6. There appears a flap in the abdominal aorta concerning for abdominal  aortic dissection - would recommend further testing with Abdominal CTA or  MRA.   7. The inferior vena cava is dilated in size with <50% respiratory  variability, suggesting right atrial pressure of 15 mmHg.    RHC/LHC 03/12/20: Findings: Ao = 175/101 (131) LV = 175/11 RA = 6 RV = 40/11 PA = 41/12 (26) PCW = 10 Fick cardiac output/index = 5.1/2.4 PVR = 3.2 WU Ao sat = 96% PA sat = 64%, 65%   Assessment: 1. Normal coronary arteries 2. LVEF 60-65% 3. Mild PAH likely due to OSA/OHS 4. Otherwise normal hemodynamics 4. Severe systemic HTN   Plan/Discussion: Medical therapy with focus  on HTN control and weight loss.   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  Genes Analyzed (43 total): APC*, ATM*, AXIN2, BARD1, BMPR1A, BRCA1*, BRCA2*, BRIP1*, CDH1*, CDK4, CDKN2A, CHEK2*, DICER1, GALNT12, HOXB13, MEN1, MLH1*, MRE11A, MSH2*, MSH3, MSH6*, MUTYH*, NBN, NF1*, NTHL1, PALB2*, PMS2*, POLD1, POLE, PTEN*, RAD50, RAD51C*, RAD51D*, RET, SDHB, SDHD, SMAD4, SMARCA4, STK11 and TP53* (sequencing and deletion/duplication); EGFR (s   GERD 07/24/2006   GRAVE'S DISEASE 01/01/2008   History of hidradenitis suppurativa    History of kidney stones    History of thrush    HIV DISEASE 07/24/2006   dx March 05   Hyperlipidemia    HYPERTENSION 07/24/2006   Hyperthyroidism 08/2006   Grave's Disease -diffuse radiotracer uptake 08/25/06 Thyroid scan-Cold nodule to R lower lobe of thyrorid   Ileus (HCC) 02/14/2023   Menometrorrhagia    hx of   Nephrolithiasis    Panniculitis 05/12/2020   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HCC)    METASTATIC PAPILLARY THYROID CARCINOMA per 01/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 Completed 2 yrs Prednisone after lung bx confirmation. No symptoms since then.   Sebaceous cyst 04/21/2020   Suppurative hidradenitis    Thyroid cancer (HCC)    THYROID NODULE, RIGHT 02/08/2007    Past Surgical History:   Procedure Laterality Date   APPLICATION OF WOUND VAC N/A 01/20/2021   Procedure: APPLICATION OF WOUND VAC;  Surgeon: Peggye Form, DO;  Location: [facility];  Service: Plastics;  Laterality: N/A;   BREAST EXCISIONAL BIOPSY Right 04/26/2018   right axilla negative   BREAST EXCISIONAL BIOPSY Left 04/26/2018   left axilla negative   BREAST LUMPECTOMY Right 10/03/2018   malignant   BREAST LUMPECTOMY WITH RADIOACTIVE SEED AND SENTINEL LYMPH NODE BIOPSY Right 10/03/2018   Procedure: RIGHT BREAST LUMPECTOMY WITH RADIOACTIVE SEED AND SENTINEL LYMPH NODE MAPPING;  Surgeon: Harriette Bouillon, MD;  Location[facility];  Service: General;  Laterality: Right;   BREAST SURGERY  1997   Breast Reduction    CYSTOSCOPY W/ URETERAL STENT REMOVAL  11/09/2012   Procedure: CYSTOSCOPY WITH STENT REMOVAL;  Surgeon: Sebastian Ache, MD;  Location: [facility];  Service: Urology;  Laterality: Right;   CYSTOSCOPY WITH RETROGRADE PYELOGRAM, URETEROSCOPY AND STENT PLACEMENT  11/09/2012   Procedure: CYSTOSCOPY WITH RETROGRADE PYELOGRAM, URETEROSCOPY AND STENT PLACEMENT;  Surgeon: Sebastian Ache, MD;  Location: [facility];  Service: Urology;  Laterality: Left;  LEFT URETEROSCOPY, STONE MANIPULATION, left STENT exchange    CYSTOSCOPY WITH STENT PLACEMENT  10/02/2012   Procedure: CYSTOSCOPY WITH STENT PLACEMENT;  Surgeon: Sebastian Ache, MD;  Location: [facility];  Service: Urology;  Laterality: Left;   DEBRIDEMENT AND CLOSURE WOUND N/A 01/20/2021   Procedure: Excision of abdominal wound with closure;  Surgeon: Peggye Form, DO;  Location: [facility];  Service: Plastics;  Laterality: N/A;   DILATION AND CURETTAGE OF UTERUS  11/2002   s/p for 1st trimester nonviable pregnancy   EYE SURGERY     sty under eyelid   INCISE AND DRAIN ABCESS  08/2002   s/p I &D for righ inframmary fold hidradenitis   INCISION AND DRAINAGE PERITONSILLAR ABSCESS  12/2001   IR CV LINE INJECTION  06/07/2018   IR FLUORO  GUIDE CV LINE RIGHT  01/30/2023   IR IMAGING GUIDED PORT INSERTION  06/20/2018   IR REMOVAL TUN ACCESS W/ PORT W/O FL MOD SED  06/20/2018   IR US GUIDE VASC ACCESS RIGHT  01/30/2023   IRRIGATION AND DEBRIDEMENT ABSCESS  01/31/2012   Procedure: IRRIGATION AND DEBRIDEMENT ABSCESS;  Surgeon: Kandis Cocking, MD;  [facility];  Service: General;  Laterality: Right;  right breast and axilla    NEPHROLITHOTOMY  10/02/2012   Procedure: NEPHROLITHOTOMY PERCUTANEOUS;  Surgeon: Sebastian Ache, MD;  [facility];  Service: Urology;  Laterality: Right;  First Stage Percutaneous Nephrolithotomy with Surgeon Access, Left Ureteral Stent     NEPHROLITHOTOMY  10/04/2012   Procedure: NEPHROLITHOTOMY PERCUTANEOUS SECOND LOOK;  Surgeon: Sebastian Ache, MD;  [facility];  Service: Urology;  Laterality: Right;      NEPHROLITHOTOMY  10/08/2012   Procedure: NEPHROLITHOTOMY PERCUTANEOUS;  Surgeon: Sebastian Ache, MD;  [facility];  Service: Urology;  Laterality: Right;  THIRD STAGE, nephrostomy tube exchange x 2   NEPHROLITHOTOMY  10/11/2012   Procedure: NEPHROLITHOTOMY PERCUTANEOUS SECOND LOOK;  Surgeon: Sebastian Ache, MD;  Location: [facility];  Service: Urology;  Laterality: Right;  RIGHT 4 STAGE PERCUTANOUS NEPHROLITHOTOMY, right URETEROSCOPY WITH HOLMIUM LASER    PANNICULECTOMY N/A 12/21/2020   Procedure: PANNICULECTOMY;  Surgeon: Peggye Form, DO;  [facility];  Service: Plastics;  Laterality: N/A;   PERCUTANEOUS NEPHROSTOLITHOTOMY  04/2022   PORT-A-CATH REMOVAL N/A 07/16/2020   Procedure: REMOVAL PORT-A-CATH;  Surgeon: Harriette Bouillon, MD;  Location: [facility];  Service: General;  Laterality: N/A;   PORTACATH PLACEMENT Left 05/17/2018   Procedure: INSERTION PORT-A-CATH;  Surgeon: Abigail Miyamoto, MD;  Location: [facility];  Service: General;  Laterality: Left;   RADICAL NECK DISSECTION  04/12/2017   limited/notes 04/12/2017   RADICAL NECK DISSECTION N/A  04/12/2017   Procedure: RADICAL NECK DISSECTION;  Surgeon: Christia Reading, MD;  Location: [facility] OR;  Service: ENT;  Laterality: N/A;  limited neck dissection 2 hours total   REDUCTION MAMMAPLASTY Bilateral 1998   RIGHT/LEFT HEART CATH AND CORONARY ANGIOGRAPHY N/A 03/12/2020   Procedure: RIGHT/LEFT HEART CATH AND CORONARY ANGIOGRAPHY;  Surgeon: Dolores Patty, MD;  [facility];  Service: Cardiovascular;  Laterality: N/A;   [place]  1994   THORACIC AORTIC ENDOVASCULAR STENT GRAFT N/A 02/02/2023   Procedure: THORACIC AORTIC ENDOVASCULAR STENT GRAFT;  Surgeon: Victorino Sparrow, MD;  Location: [facility] OR;  Service: Vascular;  Laterality: N/A;  THYROIDECTOMY  04/12/2017   completion/notes 04/12/2017   THYROIDECTOMY N/A 04/12/2017   Procedure: THYROIDECTOMY;  Surgeon: Christia Reading, MD;  Location: [facility] OR;  Service: ENT;  Laterality: N/A;  Completion Thyroidectomy   TOTAL THYROIDECTOMY  2010    MEDICATIONS:  acetaminophen (TYLENOL) 500 MG tablet   albuterol (VENTOLIN HFA) 108 (90 Base) MCG/ACT inhaler   allopurinol (ZYLOPRIM) 100 MG tablet   amLODipine (NORVASC) 5 MG tablet   aspirin 81 MG chewable tablet   AURYXIA 1 GM 210 MG(Fe) tablet   Blood Glucose Monitoring Suppl (FREESTYLE LITE) w/Device KIT   carvedilol (COREG) 25 MG tablet   dolutegravir ([name]) 50 MG tablet   emtricitabine-tenofovir AF (DESCOVY) 200-25 MG tablet   fluticasone (FLOVENT HFA) 110 MCG/ACT inhaler   Fluticasone Furoate (ARNUITY ELLIPTA) 100 MCG/ACT AEPB   Lancets (ONETOUCH ULTRASOFT) lancets   levocetirizine (XYZAL) 5 MG tablet   levothyroxine (SYNTHROID) 175 MCG tablet   losartan (COZAAR) 50 MG tablet   metoCLOPramide (REGLAN) 5 MG tablet   minocycline (DYNACIN) 100 MG tablet   omeprazole (PRILOSEC) 40 MG capsule   ondansetron (ZOFRAN) 4 MG tablet   rosuvastatin (CRESTOR) 10 MG tablet   saxagliptin HCl (ONGLYZA) 2.5 MG TABS tablet   traMADol (ULTRAM) 50 MG tablet   No current facility-administered  medications for this encounter.    Shonna Chock, PA-C Surgical Short Stay/Anesthesiology [facility] Phone [phone] [facility] Phone [phone] 07/14/2023 6:18 PM

## 2023-07-17 ENCOUNTER — Encounter: Payer: Self-pay | Admitting: *Deleted

## 2023-07-17 DIAGNOSIS — N2581 Secondary hyperparathyroidism of renal origin: Secondary | ICD-10-CM | POA: Diagnosis not present

## 2023-07-17 DIAGNOSIS — N186 End stage renal disease: Secondary | ICD-10-CM | POA: Diagnosis not present

## 2023-07-17 DIAGNOSIS — E1122 Type 2 diabetes mellitus with diabetic chronic kidney disease: Secondary | ICD-10-CM | POA: Diagnosis not present

## 2023-07-17 DIAGNOSIS — Z992 Dependence on renal dialysis: Secondary | ICD-10-CM | POA: Diagnosis not present

## 2023-07-19 DIAGNOSIS — N186 End stage renal disease: Secondary | ICD-10-CM | POA: Diagnosis not present

## 2023-07-19 DIAGNOSIS — Z992 Dependence on renal dialysis: Secondary | ICD-10-CM | POA: Diagnosis not present

## 2023-07-19 DIAGNOSIS — N2581 Secondary hyperparathyroidism of renal origin: Secondary | ICD-10-CM | POA: Diagnosis not present

## 2023-07-21 DIAGNOSIS — N186 End stage renal disease: Secondary | ICD-10-CM | POA: Diagnosis not present

## 2023-07-21 DIAGNOSIS — N2581 Secondary hyperparathyroidism of renal origin: Secondary | ICD-10-CM | POA: Diagnosis not present

## 2023-07-21 DIAGNOSIS — Z992 Dependence on renal dialysis: Secondary | ICD-10-CM | POA: Diagnosis not present

## 2023-07-24 DIAGNOSIS — N2581 Secondary hyperparathyroidism of renal origin: Secondary | ICD-10-CM | POA: Diagnosis not present

## 2023-07-24 DIAGNOSIS — N186 End stage renal disease: Secondary | ICD-10-CM | POA: Diagnosis not present

## 2023-07-24 DIAGNOSIS — Z992 Dependence on renal dialysis: Secondary | ICD-10-CM | POA: Diagnosis not present

## 2023-07-26 DIAGNOSIS — Z992 Dependence on renal dialysis: Secondary | ICD-10-CM | POA: Diagnosis not present

## 2023-07-26 DIAGNOSIS — N2581 Secondary hyperparathyroidism of renal origin: Secondary | ICD-10-CM | POA: Diagnosis not present

## 2023-07-26 DIAGNOSIS — N186 End stage renal disease: Secondary | ICD-10-CM | POA: Diagnosis not present

## 2023-07-28 DIAGNOSIS — N186 End stage renal disease: Secondary | ICD-10-CM | POA: Diagnosis not present

## 2023-07-28 DIAGNOSIS — Z992 Dependence on renal dialysis: Secondary | ICD-10-CM | POA: Diagnosis not present

## 2023-07-28 DIAGNOSIS — N2581 Secondary hyperparathyroidism of renal origin: Secondary | ICD-10-CM | POA: Diagnosis not present

## 2023-07-31 DIAGNOSIS — N2581 Secondary hyperparathyroidism of renal origin: Secondary | ICD-10-CM | POA: Diagnosis not present

## 2023-07-31 DIAGNOSIS — Z992 Dependence on renal dialysis: Secondary | ICD-10-CM | POA: Diagnosis not present

## 2023-07-31 DIAGNOSIS — N186 End stage renal disease: Secondary | ICD-10-CM | POA: Diagnosis not present

## 2023-08-01 ENCOUNTER — Ambulatory Visit: Payer: Medicare HMO | Admitting: Surgery

## 2023-08-02 DIAGNOSIS — N186 End stage renal disease: Secondary | ICD-10-CM | POA: Diagnosis not present

## 2023-08-02 DIAGNOSIS — N2581 Secondary hyperparathyroidism of renal origin: Secondary | ICD-10-CM | POA: Diagnosis not present

## 2023-08-02 DIAGNOSIS — Z992 Dependence on renal dialysis: Secondary | ICD-10-CM | POA: Diagnosis not present

## 2023-08-04 DIAGNOSIS — Z992 Dependence on renal dialysis: Secondary | ICD-10-CM | POA: Diagnosis not present

## 2023-08-04 DIAGNOSIS — N2581 Secondary hyperparathyroidism of renal origin: Secondary | ICD-10-CM | POA: Diagnosis not present

## 2023-08-04 DIAGNOSIS — N186 End stage renal disease: Secondary | ICD-10-CM | POA: Diagnosis not present

## 2023-08-07 DIAGNOSIS — N186 End stage renal disease: Secondary | ICD-10-CM | POA: Diagnosis not present

## 2023-08-07 DIAGNOSIS — N2581 Secondary hyperparathyroidism of renal origin: Secondary | ICD-10-CM | POA: Diagnosis not present

## 2023-08-07 DIAGNOSIS — Z992 Dependence on renal dialysis: Secondary | ICD-10-CM | POA: Diagnosis not present

## 2023-08-08 ENCOUNTER — Encounter: Payer: Self-pay | Admitting: Surgery

## 2023-08-08 ENCOUNTER — Ambulatory Visit: Payer: Medicare HMO | Admitting: Surgery

## 2023-08-08 ENCOUNTER — Inpatient Hospital Stay (HOSPITAL_COMMUNITY)
Admission: RE | Admit: 2023-08-08 | Discharge: 2023-08-08 | Disposition: A | Discharge status: Home / Self Care | Payer: Medicare HMO | Source: Ambulatory Visit

## 2023-08-08 VITALS — BP 157/83 | HR 79 | Resp 18 | Ht 65.0 in | Wt 177.0 lb

## 2023-08-08 DIAGNOSIS — I7103 Dissection of thoracoabdominal aorta: Secondary | ICD-10-CM | POA: Diagnosis not present

## 2023-08-08 NOTE — Progress Notes (Signed)
HPI:  The patient is a 57 year old woman with history of diabetes, hypertension, end-stage renal disease on hemodialysis, hypothyroidism due to Graves' disease, HIV on antiviral therapy, 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  Dr. Sherral Hammers reviewed the scans and felt that placement of another endovascular stent would first require de-branching of the aortic arch to allow the stent graft to cross the entire aortic arch with an adequate proximal landing zone.   We had this plan for a few weeks ago but she had a flareup of her hidradenitis beneath the medial right breast with a draining sinus.  She is on chronic minocycline to prevent this.  This area spontaneously drained and then completely cleared.  She returned today so that I could examine the area and review surgery with her which has been rescheduled for 08/25/2023.  She continues to feel well overall.  Current Outpatient Medications  Medication Sig Dispense Refill   acetaminophen (TYLENOL) 500 MG tablet Take 1,000 mg by mouth every 6 (six) hours as needed for mild pain.     albuterol (VENTOLIN HFA) 108 (90 Base) MCG/ACT inhaler Inhale 2 puffs into the lungs every 6 (six) hours as needed for wheezing or shortness of breath. 8 g 0   allopurinol (ZYLOPRIM) 100 MG tablet TAKE 1 TABLET BY MOUTH EVERY DAY FOR GOUT PREVENTION 90 tablet 2   amLODipine (NORVASC) 5 MG  tablet Take 10 mg by mouth daily.     aspirin 81 MG chewable tablet Chew 81 mg by mouth daily.     AURYXIA 1 GM 210 MG(Fe) tablet Take 210 mg by mouth 3 (three) times daily with meals.     Blood Glucose Monitoring Suppl (FREESTYLE LITE) w/Device KIT Inject 1 each into the skin daily. 1 kit 0   carvedilol (COREG) 25 MG tablet Take 1 tablet (25 mg total) by mouth 2 (two) times daily. 180 tablet 3   dolutegravir ([name]) 50 MG tablet Take 1 tablet (50 mg total) by mouth daily. 30 tablet 11   emtricitabine-tenofovir AF (DESCOVY) 200-25 MG tablet Take 1 tablet by mouth daily. 30 tablet 11   fluticasone (FLOVENT HFA) 110 MCG/ACT inhaler Inhale 1 puff into the lungs 2 (two) times daily.     Fluticasone Furoate (ARNUITY ELLIPTA) 100 MCG/ACT AEPB Inhale 1 puff into the lungs daily. 90 each 1   Lancets (ONETOUCH ULTRASOFT) lancets Use as instructed to test blood sugar daily 100 each 5   levocetirizine (XYZAL) 5 MG tablet Take 1 tablet (5 mg total) by mouth every evening. For allergies 90 tablet 3   levothyroxine (SYNTHROID) 175 MCG tablet TAKE 1 TABLET BY MOUTH EVERY DAY 90 tablet 3   metoCLOPramide (REGLAN) 5 MG tablet Take 1 tablet (5 mg total) by mouth every 6 (six) hours as needed for nausea. 20 tablet 0   minocycline (DYNACIN) 100 MG tablet Take 1 tablet (100 mg total) by mouth  2 (two) times daily. (Patient taking differently: Take 100 mg by mouth daily.) 60 tablet 11   omeprazole (PRILOSEC) 40 MG capsule Take 1 capsule (40 mg total) by mouth 2 (two) times daily before a meal. 180 capsule 0   ondansetron (ZOFRAN) 4 MG tablet Take 1 tablet (4 mg total) by mouth daily as needed for nausea or vomiting. 20 tablet 0   rosuvastatin (CRESTOR) 10 MG tablet TAKE 1 TABLET BY MOUTH EVERY DAY FOR CHOLESTEROL 90 tablet 2   saxagliptin HCl (ONGLYZA) 2.5 MG TABS tablet Take 1 tablet (2.5 mg total) by mouth daily. for diabetes. 90 tablet 1   traMADol (ULTRAM) 50 MG tablet Take 1 tablet (50 mg total) by mouth every 6  (six) hours as needed for moderate pain. 20 tablet 0   losartan (COZAAR) 50 MG tablet Take 1 tablet (50 mg total) by mouth daily. for blood pressure. 90 tablet 3   No current facility-administered medications for this visit.     Physical Exam: BP (!) 157/83 (BP Location: Left Arm, Patient Position: Sitting)   Pulse 79   Resp 18   Ht 5\' 5"  (1.651 m)   Wt 177 lb (80.3 kg)   LMP 03/31/2014 (LMP Unknown)   SpO2 97% Comment: RA  BMI 29.45 kg/m  She looks well. Cardiac exam shows a regular rate and rhythm with normal heart sounds.  There is no murmur. Lungs are clear. The hidradenitis scars beneath her right breast are stable.  There is no current sign of infection.   Impression:  She is stable for  median sternotomy for aortic arch de-branching and stent grafting across the aortic arch.  I reviewed the CT images with her again and answered all of her questions. I discussed the operative procedure with the patient including alternatives, benefits and risks; including but not limited to bleeding, blood transfusion, infection, stroke, paresis of paralysis due to spinal cord ischemia, organ dysfunction, and death.  [name] understands and agrees to proceed.   Plan:  She will undergo outpatient dialysis on Thursday, 08/24/2023 with plans for surgery as above on 08/25/2023.  I spent 10 minutes performing this established patient evaluation and > 50% of this time was spent face to face counseling and coordinating the care of this patient's aortic aneurysm.    [name], MD [facility] [phone]

## 2023-08-09 DIAGNOSIS — N2581 Secondary hyperparathyroidism of renal origin: Secondary | ICD-10-CM | POA: Diagnosis not present

## 2023-08-09 DIAGNOSIS — Z992 Dependence on renal dialysis: Secondary | ICD-10-CM | POA: Diagnosis not present

## 2023-08-09 DIAGNOSIS — N186 End stage renal disease: Secondary | ICD-10-CM | POA: Diagnosis not present

## 2023-08-10 ENCOUNTER — Ambulatory Visit: Payer: Medicare HMO | Admitting: Infectious Diseases

## 2023-08-10 ENCOUNTER — Ambulatory Visit: Payer: Medicare HMO | Admitting: Surgery

## 2023-08-11 DIAGNOSIS — N186 End stage renal disease: Secondary | ICD-10-CM | POA: Diagnosis not present

## 2023-08-11 DIAGNOSIS — N2581 Secondary hyperparathyroidism of renal origin: Secondary | ICD-10-CM | POA: Diagnosis not present

## 2023-08-11 DIAGNOSIS — Z992 Dependence on renal dialysis: Secondary | ICD-10-CM | POA: Diagnosis not present

## 2023-08-14 ENCOUNTER — Telehealth: Payer: Self-pay

## 2023-08-14 ENCOUNTER — Inpatient Hospital Stay: Payer: Medicare HMO | Attending: Hematology and Oncology | Admitting: Hematology and Oncology

## 2023-08-14 DIAGNOSIS — Z992 Dependence on renal dialysis: Secondary | ICD-10-CM | POA: Diagnosis not present

## 2023-08-14 DIAGNOSIS — N186 End stage renal disease: Secondary | ICD-10-CM | POA: Diagnosis not present

## 2023-08-14 DIAGNOSIS — N2581 Secondary hyperparathyroidism of renal origin: Secondary | ICD-10-CM | POA: Diagnosis not present

## 2023-08-14 NOTE — Telephone Encounter (Signed)
Spoke with Patient and she was unaware of the appointment for today. I gave her the main number to call [phone] to have her reschedule the appointment.  She also wanted to know when she was considered cancer free and so I transferred her to Jen Mow Dr Iruku's nurse.

## 2023-08-16 DIAGNOSIS — Z992 Dependence on renal dialysis: Secondary | ICD-10-CM | POA: Diagnosis not present

## 2023-08-16 DIAGNOSIS — N186 End stage renal disease: Secondary | ICD-10-CM | POA: Diagnosis not present

## 2023-08-16 DIAGNOSIS — N2581 Secondary hyperparathyroidism of renal origin: Secondary | ICD-10-CM | POA: Diagnosis not present

## 2023-08-17 DIAGNOSIS — E1122 Type 2 diabetes mellitus with diabetic chronic kidney disease: Secondary | ICD-10-CM | POA: Diagnosis not present

## 2023-08-17 DIAGNOSIS — Z992 Dependence on renal dialysis: Secondary | ICD-10-CM | POA: Diagnosis not present

## 2023-08-17 DIAGNOSIS — N186 End stage renal disease: Secondary | ICD-10-CM | POA: Diagnosis not present

## 2023-08-18 DIAGNOSIS — N2581 Secondary hyperparathyroidism of renal origin: Secondary | ICD-10-CM | POA: Diagnosis not present

## 2023-08-18 DIAGNOSIS — Z992 Dependence on renal dialysis: Secondary | ICD-10-CM | POA: Diagnosis not present

## 2023-08-18 DIAGNOSIS — N186 End stage renal disease: Secondary | ICD-10-CM | POA: Diagnosis not present

## 2023-08-21 DIAGNOSIS — Z992 Dependence on renal dialysis: Secondary | ICD-10-CM | POA: Diagnosis not present

## 2023-08-21 DIAGNOSIS — N2581 Secondary hyperparathyroidism of renal origin: Secondary | ICD-10-CM | POA: Diagnosis not present

## 2023-08-21 DIAGNOSIS — N186 End stage renal disease: Secondary | ICD-10-CM | POA: Diagnosis not present

## 2023-08-22 ENCOUNTER — Other Ambulatory Visit: Payer: Self-pay

## 2023-08-22 DIAGNOSIS — Z992 Dependence on renal dialysis: Secondary | ICD-10-CM | POA: Diagnosis not present

## 2023-08-22 DIAGNOSIS — N186 End stage renal disease: Secondary | ICD-10-CM | POA: Diagnosis not present

## 2023-08-22 DIAGNOSIS — N2581 Secondary hyperparathyroidism of renal origin: Secondary | ICD-10-CM | POA: Diagnosis not present

## 2023-08-22 DIAGNOSIS — I7103 Dissection of thoracoabdominal aorta: Secondary | ICD-10-CM

## 2023-08-22 NOTE — Pre-Procedure Instructions (Signed)
Surgical Instructions   Your procedure is scheduled on August 25, 2023. Report to [facility] Main Entrance "A" at 5:30 A.M., then check in with the Admitting office. Any questions or running late day of surgery: call [phone]  Questions prior to your surgery date: call [phone], Monday-Friday, 8am-4pm. If you experience any cold or flu symptoms such as cough, fever, chills, shortness of breath, etc. between now and your scheduled surgery, please notify us at the above number.     Remember:  Do not eat or drink after midnight the night before your surgery    Take these medicines the morning of surgery with A SIP OF WATER: allopurinol (ZYLOPRIM)  amLODipine (NORVASC)  carvedilol (COREG)  dolutegravir ([name])  fluticasone (FLOVENT HFA) inhaler  Fluticasone Furoate (ARNUITY ELLIPTA)  levothyroxine (SYNTHROID)  minocycline (DYNACIN)  omeprazole (PRILOSEC)  rosuvastatin (CRESTOR)    May take these medicines IF NEEDED: acetaminophen (TYLENOL)  albuterol (VENTOLIN HFA) inhaler  ondansetron (ZOFRAN)  traMADol (ULTRAM)    Continue taking your Aspirin through the day before surgery. DO NOT take any the morning of surgery.   One week prior to surgery, STOP taking any Aleve, Naproxen, Ibuprofen, Motrin, Advil, Goody's, BC's, all herbal medications, fish oil, and non-prescription vitamins.   WHAT DO I DO ABOUT MY DIABETES MEDICATION?   STOP taking your saxagliptin HCl (ONGLYZA) three days prior to surgery. Your last dose will be November 4th.      HOW TO MANAGE YOUR DIABETES BEFORE AND AFTER SURGERY  Why is it important to control my blood sugar before and after surgery? Improving blood sugar levels before and after surgery helps healing and can limit problems. A way of improving blood sugar control is eating a healthy diet by:  Eating less sugar and carbohydrates  Increasing activity/exercise  Talking with your doctor about reaching your blood sugar goals High  blood sugars (greater than 180 mg/dL) can raise your risk of infections and slow your recovery, so you will need to focus on controlling your diabetes during the weeks before surgery. Make sure that the doctor who takes care of your diabetes knows about your planned surgery including the date and location.  How do I manage my blood sugar before surgery? Check your blood sugar at least 4 times a day, starting 2 days before surgery, to make sure that the level is not too high or low.  Check your blood sugar the morning of your surgery when you wake up and every 2 hours until you get to the Short Stay unit.  If your blood sugar is less than 70 mg/dL, you will need to treat for low blood sugar: Do not take insulin. Treat a low blood sugar (less than 70 mg/dL) with  cup of clear juice (cranberry or apple), 4 glucose tablets, OR glucose gel. Recheck blood sugar in 15 minutes after treatment (to make sure it is greater than 70 mg/dL). If your blood sugar is not greater than 70 mg/dL on recheck, call [phone] for further instructions. Report your blood sugar to the short stay nurse when you get to Short Stay.  If you are admitted to the hospital after surgery: Your blood sugar will be checked by the staff and you will probably be given insulin after surgery (instead of oral diabetes medicines) to make sure you have good blood sugar levels. The goal for blood sugar control after surgery is 80-180 mg/dL.  Do NOT Smoke (Tobacco/Vaping) for 24 hours prior to your procedure.  If you use a CPAP at night, you may bring your mask/headgear for your overnight stay.   You will be asked to remove any contacts, glasses, piercing's, hearing aid's, dentures/partials prior to surgery. Please bring cases for these items if needed.    Patients discharged the day of surgery will not be allowed to drive home, and someone needs to stay with them for 24 hours.  SURGICAL WAITING ROOM  VISITATION Patients may have no more than 2 support people in the waiting area - these visitors may rotate.   Pre-op nurse will coordinate an appropriate time for 1 ADULT support person, who may not rotate, to accompany patient in pre-op.  Children under the age of 62 must have an adult with them who is not the patient and must remain in the main waiting area with an adult.  If the patient needs to stay at the hospital during part of their recovery, the visitor guidelines for inpatient rooms apply.  Please refer to the [facility] website for the visitor guidelines for any additional information.   If you received a COVID test during your pre-op visit  it is requested that you wear a mask when out in public, stay away from anyone that may not be feeling well and notify your surgeon if you develop symptoms. If you have been in contact with anyone that has tested positive in the last 10 days please notify you surgeon.      Pre-operative CHG Bathing Instructions   You can play a key role in reducing the risk of infection after surgery. Your skin needs to be as free of germs as possible. You can reduce the number of germs on your skin by washing with CHG (chlorhexidine gluconate) soap before surgery. CHG is an antiseptic soap that kills germs and continues to kill germs even after washing.   DO NOT use if you have an allergy to chlorhexidine/CHG or antibacterial soaps. If your skin becomes reddened or irritated, stop using the CHG and notify one of our RNs at [phone].              TAKE A SHOWER THE NIGHT BEFORE SURGERY AND THE DAY OF SURGERY    Please keep in mind the following:  DO NOT shave, including legs and underarms, 48 hours prior to surgery.   You may shave your face before/day of surgery.  Place clean sheets on your bed the night before surgery Use a clean washcloth (not used since being washed) for each shower. DO NOT sleep with pet's night before surgery.  CHG Shower  Instructions:  Wash your face and private area with normal soap. If you choose to wash your hair, wash first with your normal shampoo.  After you use shampoo/soap, rinse your hair and body thoroughly to remove shampoo/soap residue.  Turn the water OFF and apply half the bottle of CHG soap to a CLEAN washcloth.  Apply CHG soap ONLY FROM YOUR NECK DOWN TO YOUR TOES (washing for 3-5 minutes)  DO NOT use CHG soap on face, private areas, open wounds, or sores.  Pay special attention to the area where your surgery is being performed.  If you are having back surgery, having someone wash your back for you may be helpful. Wait 2 minutes after CHG soap is applied, then you may rinse off the CHG soap.  Pat dry with a clean towel  Put on clean pajamas  Additional instructions for the day of surgery: DO NOT APPLY any lotions, deodorants, cologne, or perfumes.   Do not wear jewelry or makeup Do not wear nail polish, gel polish, artificial nails, or any other type of covering on natural nails (fingers and toes) Do not bring valuables to the hospital. [facility] is not responsible for valuables/personal belongings. Put on clean/comfortable clothes.  Please brush your teeth.  Ask your nurse before applying any prescription medications to the skin.

## 2023-08-23 ENCOUNTER — Encounter (HOSPITAL_COMMUNITY): Payer: Self-pay

## 2023-08-23 ENCOUNTER — Ambulatory Visit (HOSPITAL_COMMUNITY)
Admission: RE | Admit: 2023-08-23 | Discharge: 2023-08-23 | Disposition: A | Discharge status: Home / Self Care | Payer: Medicare HMO | Source: Ambulatory Visit | Attending: Surgery | Admitting: Surgery

## 2023-08-23 ENCOUNTER — Other Ambulatory Visit: Payer: Self-pay

## 2023-08-23 ENCOUNTER — Encounter (HOSPITAL_COMMUNITY)
Admission: RE | Admit: 2023-08-23 | Discharge: 2023-08-23 | Disposition: A | Discharge status: Home / Self Care | Payer: Medicare HMO | Source: Ambulatory Visit | Attending: Surgery | Admitting: Surgery

## 2023-08-23 VITALS — BP 165/74 | HR 71 | Temp 97.5°F | Resp 17 | Ht 65.0 in | Wt 179.2 lb

## 2023-08-23 DIAGNOSIS — E890 Postprocedural hypothyroidism: Secondary | ICD-10-CM | POA: Diagnosis present

## 2023-08-23 DIAGNOSIS — Z8679 Personal history of other diseases of the circulatory system: Secondary | ICD-10-CM | POA: Diagnosis not present

## 2023-08-23 DIAGNOSIS — J45909 Unspecified asthma, uncomplicated: Secondary | ICD-10-CM | POA: Diagnosis present

## 2023-08-23 DIAGNOSIS — I7112 Aneurysm of the aortic arch, ruptured: Secondary | ICD-10-CM | POA: Diagnosis not present

## 2023-08-23 DIAGNOSIS — E662 Morbid (severe) obesity with alveolar hypoventilation: Secondary | ICD-10-CM | POA: Diagnosis present

## 2023-08-23 DIAGNOSIS — I340 Nonrheumatic mitral (valve) insufficiency: Secondary | ICD-10-CM | POA: Insufficient documentation

## 2023-08-23 DIAGNOSIS — Z01818 Encounter for other preprocedural examination: Secondary | ICD-10-CM | POA: Insufficient documentation

## 2023-08-23 DIAGNOSIS — I447 Left bundle-branch block, unspecified: Secondary | ICD-10-CM | POA: Insufficient documentation

## 2023-08-23 DIAGNOSIS — T82320A Displacement of aortic (bifurcation) graft (replacement), initial encounter: Secondary | ICD-10-CM | POA: Diagnosis present

## 2023-08-23 DIAGNOSIS — I719 Aortic aneurysm of unspecified site, without rupture: Secondary | ICD-10-CM | POA: Diagnosis not present

## 2023-08-23 DIAGNOSIS — I7122 Aneurysm of the aortic arch, without rupture: Secondary | ICD-10-CM | POA: Diagnosis present

## 2023-08-23 DIAGNOSIS — Z992 Dependence on renal dialysis: Secondary | ICD-10-CM | POA: Diagnosis not present

## 2023-08-23 DIAGNOSIS — E1122 Type 2 diabetes mellitus with diabetic chronic kidney disease: Secondary | ICD-10-CM | POA: Diagnosis present

## 2023-08-23 DIAGNOSIS — I517 Cardiomegaly: Secondary | ICD-10-CM | POA: Diagnosis not present

## 2023-08-23 DIAGNOSIS — I82622 Acute embolism and thrombosis of deep veins of left upper extremity: Secondary | ICD-10-CM | POA: Diagnosis not present

## 2023-08-23 DIAGNOSIS — I120 Hypertensive chronic kidney disease with stage 5 chronic kidney disease or end stage renal disease: Secondary | ICD-10-CM | POA: Diagnosis present

## 2023-08-23 DIAGNOSIS — Z85850 Personal history of malignant neoplasm of thyroid: Secondary | ICD-10-CM | POA: Insufficient documentation

## 2023-08-23 DIAGNOSIS — Z9089 Acquired absence of other organs: Secondary | ICD-10-CM | POA: Insufficient documentation

## 2023-08-23 DIAGNOSIS — R918 Other nonspecific abnormal finding of lung field: Secondary | ICD-10-CM | POA: Diagnosis not present

## 2023-08-23 DIAGNOSIS — I82B12 Acute embolism and thrombosis of left subclavian vein: Secondary | ICD-10-CM | POA: Diagnosis not present

## 2023-08-23 DIAGNOSIS — J9811 Atelectasis: Secondary | ICD-10-CM | POA: Diagnosis not present

## 2023-08-23 DIAGNOSIS — Z95828 Presence of other vascular implants and grafts: Secondary | ICD-10-CM | POA: Diagnosis not present

## 2023-08-23 DIAGNOSIS — J939 Pneumothorax, unspecified: Secondary | ICD-10-CM | POA: Diagnosis not present

## 2023-08-23 DIAGNOSIS — Z452 Encounter for adjustment and management of vascular access device: Secondary | ICD-10-CM | POA: Diagnosis not present

## 2023-08-23 DIAGNOSIS — Z4682 Encounter for fitting and adjustment of non-vascular catheter: Secondary | ICD-10-CM | POA: Diagnosis not present

## 2023-08-23 DIAGNOSIS — N186 End stage renal disease: Secondary | ICD-10-CM | POA: Diagnosis present

## 2023-08-23 DIAGNOSIS — Z79899 Other long term (current) drug therapy: Secondary | ICD-10-CM | POA: Diagnosis not present

## 2023-08-23 DIAGNOSIS — Y832 Surgical operation with anastomosis, bypass or graft as the cause of abnormal reaction of the patient, or of later complication, without mention of misadventure at the time of the procedure: Secondary | ICD-10-CM | POA: Diagnosis present

## 2023-08-23 DIAGNOSIS — D631 Anemia in chronic kidney disease: Secondary | ICD-10-CM | POA: Diagnosis present

## 2023-08-23 DIAGNOSIS — I71012 Dissection of descending thoracic aorta: Secondary | ICD-10-CM | POA: Diagnosis not present

## 2023-08-23 DIAGNOSIS — E1151 Type 2 diabetes mellitus with diabetic peripheral angiopathy without gangrene: Secondary | ICD-10-CM | POA: Diagnosis present

## 2023-08-23 DIAGNOSIS — I7103 Dissection of thoracoabdominal aorta: Secondary | ICD-10-CM | POA: Insufficient documentation

## 2023-08-23 DIAGNOSIS — Z8616 Personal history of COVID-19: Secondary | ICD-10-CM | POA: Diagnosis not present

## 2023-08-23 DIAGNOSIS — M898X9 Other specified disorders of bone, unspecified site: Secondary | ICD-10-CM | POA: Diagnosis present

## 2023-08-23 DIAGNOSIS — Z1152 Encounter for screening for COVID-19: Secondary | ICD-10-CM | POA: Insufficient documentation

## 2023-08-23 DIAGNOSIS — I82C12 Acute embolism and thrombosis of left internal jugular vein: Secondary | ICD-10-CM | POA: Diagnosis not present

## 2023-08-23 DIAGNOSIS — B20 Human immunodeficiency virus [HIV] disease: Secondary | ICD-10-CM | POA: Diagnosis not present

## 2023-08-23 DIAGNOSIS — J984 Other disorders of lung: Secondary | ICD-10-CM | POA: Diagnosis not present

## 2023-08-23 DIAGNOSIS — E785 Hyperlipidemia, unspecified: Secondary | ICD-10-CM | POA: Insufficient documentation

## 2023-08-23 DIAGNOSIS — Y712 Prosthetic and other implants, materials and accessory cardiovascular devices associated with adverse incidents: Secondary | ICD-10-CM | POA: Diagnosis present

## 2023-08-23 DIAGNOSIS — G8918 Other acute postprocedural pain: Secondary | ICD-10-CM | POA: Diagnosis not present

## 2023-08-23 DIAGNOSIS — M7989 Other specified soft tissue disorders: Secondary | ICD-10-CM | POA: Diagnosis not present

## 2023-08-23 DIAGNOSIS — C799 Secondary malignant neoplasm of unspecified site: Secondary | ICD-10-CM | POA: Diagnosis present

## 2023-08-23 DIAGNOSIS — N185 Chronic kidney disease, stage 5: Secondary | ICD-10-CM | POA: Diagnosis not present

## 2023-08-23 DIAGNOSIS — I700 Atherosclerosis of aorta: Secondary | ICD-10-CM | POA: Insufficient documentation

## 2023-08-23 DIAGNOSIS — N2581 Secondary hyperparathyroidism of renal origin: Secondary | ICD-10-CM | POA: Diagnosis present

## 2023-08-23 DIAGNOSIS — Z79890 Hormone replacement therapy (postmenopausal): Secondary | ICD-10-CM | POA: Diagnosis not present

## 2023-08-23 HISTORY — DX: Peripheral vascular disease, unspecified: I73.9

## 2023-08-23 LAB — CBC
HCT: 37.8 % (ref 36.0–46.0)
Hemoglobin: 11.8 g/dL — ABNORMAL LOW (ref 12.0–15.0)
MCH: 29.1 pg (ref 26.0–34.0)
MCHC: 31.2 g/dL (ref 30.0–36.0)
MCV: 93.3 fL (ref 80.0–100.0)
Platelets: 221 10*3/uL (ref 150–400)
RBC: 4.05 MIL/uL (ref 3.87–5.11)
RDW: 16.8 % — ABNORMAL HIGH (ref 11.5–15.5)
WBC: 7.1 10*3/uL (ref 4.0–10.5)
nRBC: 0 % (ref 0.0–0.2)

## 2023-08-23 LAB — COMPREHENSIVE METABOLIC PANEL
ALT: 13 U/L (ref 0–44)
AST: 21 U/L (ref 15–41)
Albumin: 2.9 g/dL — ABNORMAL LOW (ref 3.5–5.0)
Alkaline Phosphatase: 122 U/L (ref 38–126)
Anion gap: 16 — ABNORMAL HIGH (ref 5–15)
BUN: 31 mg/dL — ABNORMAL HIGH (ref 6–20)
CO2: 23 mmol/L (ref 22–32)
Calcium: 6.8 mg/dL — ABNORMAL LOW (ref 8.9–10.3)
Chloride: 97 mmol/L — ABNORMAL LOW (ref 98–111)
Creatinine, Ser: 4.46 mg/dL — ABNORMAL HIGH (ref 0.44–1.00)
GFR, Estimated: 11 mL/min — ABNORMAL LOW (ref >60)
Glucose, Bld: 218 mg/dL — ABNORMAL HIGH (ref 70–99)
Potassium: 5.4 mmol/L — ABNORMAL HIGH (ref 3.5–5.1)
Sodium: 136 mmol/L (ref 135–145)
Total Bilirubin: 0.4 mg/dL (ref <1.2)
Total Protein: 7.7 g/dL (ref 6.5–8.1)

## 2023-08-23 LAB — SURGICAL PCR SCREEN
MRSA, PCR: NEGATIVE
Staphylococcus aureus: NEGATIVE

## 2023-08-23 LAB — PROTIME-INR
INR: 1 (ref 0.8–1.2)
Prothrombin Time: 13.1 s (ref 11.4–15.2)

## 2023-08-23 LAB — GLUCOSE, CAPILLARY: Glucose-Capillary: 165 mg/dL — ABNORMAL HIGH (ref 70–99)

## 2023-08-23 LAB — SARS CORONAVIRUS 2 (TAT 6-24 HRS): SARS Coronavirus 2: NEGATIVE

## 2023-08-23 LAB — APTT: aPTT: 27 s (ref 24–36)

## 2023-08-23 LAB — HEMOGLOBIN A1C
Hgb A1c MFr Bld: 5.2 % (ref 4.8–5.6)
Mean Plasma Glucose: 102.54 mg/dL

## 2023-08-23 NOTE — Progress Notes (Signed)
PCP - Vernona Rieger, NP Cardiologist - Pt denies, but saw Dr. Arvilla Meres in 2022. Encounter also mentioned with Dr. Chilton Si in 2023  PPM/ICD - Denies Device Orders - n/a Rep Notified - n/a  Chest x-ray - 08/23/2023 EKG - 08/23/2023 Stress Test - 11/03/2017 ECHO - 01/30/2023 Cardiac Cath - 03/12/2020  Sleep Study - Denies CPAP - n/a  Pt is DM2. She checks her blood sugar 2x/day. Normal fasting range is around 118. CBG at pre-op 165. A1c result pending.  Last dose of GLP1 agonist- n/a GLP1 instructions: n/a  Blood Thinner Instructions: n/a Aspirin Instructions: Pt will continue to take ASA through day before surgery. Instructed to not take any morning of surgery.  NPO after midnight  COVID TEST- Yes. Result pending.   Anesthesia review: Yes. Previously reviewed by Shonna Chock, PA-C. Pt had an open, draining boil on medial right breast at previous PAT appointment. Dr. Laneta Simmers made decision to cancel sx until boil healed. Per note on 10/22, MD examined area and deemed okay to proceed with surgery.  Patient denies shortness of breath, fever, cough and chest pain at PAT appointment. Pt denies any respiratory illness/infection in the last two months.    All instructions explained to the patient, with a verbal understanding of the material. Patient agrees to go over the instructions while at home for a better understanding. Patient also instructed to self quarantine after being tested for COVID-19. The opportunity to ask questions was provided.

## 2023-08-23 NOTE — Progress Notes (Signed)
Unable to collect ABG and UA at pre-op visit. Will need to be collected DOS. Orders modified

## 2023-08-24 DIAGNOSIS — Z992 Dependence on renal dialysis: Secondary | ICD-10-CM | POA: Diagnosis not present

## 2023-08-24 DIAGNOSIS — N2581 Secondary hyperparathyroidism of renal origin: Secondary | ICD-10-CM | POA: Diagnosis not present

## 2023-08-24 DIAGNOSIS — N186 End stage renal disease: Secondary | ICD-10-CM | POA: Diagnosis not present

## 2023-08-24 MED ORDER — EPINEPHRINE HCL 5 MG/250ML IV SOLN IN NS
0.0000 ug/min | INTRAVENOUS | Status: DC
  Filled 2023-08-24: qty 250

## 2023-08-24 MED ORDER — TRANEXAMIC ACID (OHS) BOLUS VIA INFUSION
15.0000 mg/kg | INTRAVENOUS | Status: AC
  Administered 2023-08-25: 1219.5 mg via INTRAVENOUS
  Filled 2023-08-24: qty 1220

## 2023-08-24 MED ORDER — TRANEXAMIC ACID (OHS) PUMP PRIME SOLUTION
2.0000 mg/kg | INTRAVENOUS | Status: DC
  Filled 2023-08-24: qty 1.63

## 2023-08-24 MED ORDER — INSULIN REGULAR(HUMAN) IN NACL 100-0.9 UT/100ML-% IV SOLN
INTRAVENOUS | Status: AC
  Administered 2023-08-25: 4.8 [IU]/h via INTRAVENOUS
  Filled 2023-08-24: qty 100

## 2023-08-24 MED ORDER — DEXMEDETOMIDINE HCL IN NACL 400 MCG/100ML IV SOLN
0.1000 ug/kg/h | INTRAVENOUS | Status: AC
  Administered 2023-08-25: 1 ug/kg/h via INTRAVENOUS
  Filled 2023-08-24: qty 100

## 2023-08-24 MED ORDER — NITROGLYCERIN IN D5W 200-5 MCG/ML-% IV SOLN
2.0000 ug/min | INTRAVENOUS | Status: DC
  Filled 2023-08-24: qty 250

## 2023-08-24 MED ORDER — PLASMA-LYTE A IV SOLN
INTRAVENOUS | Status: DC
  Filled 2023-08-24: qty 2.5

## 2023-08-24 MED ORDER — HEPARIN 30,000 UNITS/1000 ML (OHS) CELLSAVER SOLUTION
Status: DC
  Filled 2023-08-24: qty 1000

## 2023-08-24 MED ORDER — POTASSIUM CHLORIDE 2 MEQ/ML IV SOLN
80.0000 meq | INTRAVENOUS | Status: DC
  Filled 2023-08-24: qty 40

## 2023-08-24 MED ORDER — MILRINONE LACTATE IN DEXTROSE 20-5 MG/100ML-% IV SOLN
0.3000 ug/kg/min | INTRAVENOUS | Status: DC
  Filled 2023-08-24: qty 100

## 2023-08-24 MED ORDER — TRANEXAMIC ACID 1000 MG/10ML IV SOLN
1.5000 mg/kg/h | INTRAVENOUS | Status: AC
  Administered 2023-08-25: 1.5 mg/kg/h via INTRAVENOUS
  Filled 2023-08-24: qty 25

## 2023-08-24 MED ORDER — CEFAZOLIN SODIUM-DEXTROSE 2-4 GM/100ML-% IV SOLN
2.0000 g | INTRAVENOUS | Status: DC
  Filled 2023-08-24: qty 100

## 2023-08-24 MED ORDER — VANCOMYCIN HCL 1250 MG/250ML IV SOLN
1250.0000 mg | INTRAVENOUS | Status: AC
  Administered 2023-08-25: 1250 mg via INTRAVENOUS
  Filled 2023-08-24: qty 250

## 2023-08-24 MED ORDER — PHENYLEPHRINE HCL-NACL 20-0.9 MG/250ML-% IV SOLN
30.0000 ug/min | INTRAVENOUS | Status: AC
  Administered 2023-08-25: 10 ug/min via INTRAVENOUS
  Filled 2023-08-24: qty 250

## 2023-08-24 MED ORDER — MANNITOL 20 % IV SOLN
INTRAVENOUS | Status: DC
  Filled 2023-08-24: qty 13

## 2023-08-24 MED ORDER — NOREPINEPHRINE 4 MG/250ML-% IV SOLN
0.0000 ug/min | INTRAVENOUS | Status: AC
  Administered 2023-08-25: 4 ug/min via INTRAVENOUS
  Filled 2023-08-24: qty 250

## 2023-08-24 NOTE — Progress Notes (Signed)
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 Surgery was initially scheduled for 07/13/23, but she developed exacerbation of breast hidradenitis suppurativa. She had follow-up with Dr. Laneta Simmers on 08/08/23 and the area had cleared. Case rescheduled for 08/25/23. She is s/p TEVAR on 02/02/23 for type B aortic dissection. CTA chest/abd/pelvis on 05/04/23 showed some slipping of the endovascular graft with aneurysmal dilation of the aorta. Placement of another endovascular stent recommended but would first require de-branching of the aortic arch to allow the stent graft to cross the entire aortic arch with an adequate proximal landing zone.    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    She had normal coronaries by [facility] on 03/12/20. 01/30/23 echo showed LVEF 55-60%, no regional wall motion abnormalities, mild concentric LVH, grade  II diastolic dysfunction, normal RV systolic function, mild-moderate MR, abdominal aorta flap also noted concerning for dissection (s/p TEVAR 02/02/23). 02/05/23 EKG showed NSR, LVH, although "BBB" was noted on telemetry strips documentation from 01/23/23. 07/11/23 EKG showed LBBB. 08/23/23 EKG showed NSR, marked ST/T wave abnormality, consider anterolateral ischemic. K was 5.4. BP 165/74. I reached out to [facility] for Dr. Laneta Simmers to review for additional recommendations, if any.   Unable to collect ABG or UA at PAT. ABG for the day of surgery, and UA if able to collect (she does have ESRD). She will also need iSTAT labs given ESRD. 08/23/23 COVID-19 test negative. 08/23/23 CXR report is in process.  UPDATE 08/24/23 4:08 PM: Dr. Laneta Simmers reviewed EKG. No new recommendations. Anesthesia team to evaluate on the day of surgery.   VS: BP (!) 165/74   Pulse 71   Temp (!) 36.4 C   Resp 17   Ht 5\' 5"  (1.651 m)   Wt 81.3 kg   LMP 03/31/2014 (LMP Unknown)   SpO2 100%   BMI 29.82 kg/m    PROVIDERS: Doreene Nest, NP is PCP  - Gerarda Fraction, MD is vascular surgeon - Evelene Croon, MD is CT surgeon - She was previously followed by HF cardiologist Arvilla Meres, MD at the [facility], last visit 08/31/21 for EF monitoring during chemotherapy.  EF 55-60% by 02/2020 echo. Normal coronaries in 2021. As needed follow-up recommended.  Dorothy Puffer, MD is RAD-ONC Rachel Moulds, MD is HEM-ONC - Cliffton Asters, MD is ID - Zetta Bills, MD is nephrologist - Romero Belling, MD is endocrinologist. Last visit 11/02/21.    LABS: Preoperative labs noted. See DISCUSSION.     (all labs  ordered are listed, but only abnormal results are displayed)  Labs Reviewed  GLUCOSE, CAPILLARY - Abnormal; Notable for the following components:      Result Value   Glucose-Capillary 165 (*)    All other components within normal limits  CBC - Abnormal; Notable for the following components:   Hemoglobin 11.8 (*)    RDW  16.8 (*)    All other components within normal limits  COMPREHENSIVE METABOLIC PANEL - Abnormal; Notable for the following components:   Potassium 5.4 (*)    Chloride 97 (*)    Glucose, Bld 218 (*)    BUN 31 (*)    Creatinine, Ser 4.46 (*)    Calcium 6.8 (*)    Albumin 2.9 (*)    GFR, Estimated 11 (*)    Anion gap 16 (*)    All other components within normal limits  SARS CORONAVIRUS 2 (TAT 6-24 HRS)  SURGICAL PCR SCREEN  PROTIME-INR  APTT  HEMOGLOBIN A1C  TYPE AND SCREEN     IMAGES: CXR 08/23/23: In process.   CTA Chest/abd/pelvis 05/04/23: IMPRESSION: 1. Interval change in the appearance and configuration of the most proximal thoracic aortic stent graft. The stent graft has expanded from 2.6 cm in diameter in April of 2024 to 4.7 cm in diameter on today's examination. Additionally, there is a new focal outpouching of contrast material along the aortic infundibulum at the proximal landing zone concerning for ulceration/pseudoaneurysm development. 2. Persistent retrograde filling of the false lumen with mildly increased size of the descending thoracic aorta to 4.8 cm. 3. Similarly, there is a mild enlargement of the visceral aorta now measuring up to 3.6 cm compared to 3.2 cm previously at the origin of the celiac artery. 4. Similar extent of abdominal aortic dissection flap extending through the iliac system on the left and into the common femoral artery. 5. Slight interval improvement in the appearance of the dissection involvement of the left renal artery. There is persistent stenosis but the artery remains patent and the flow channel seems to be improving. 6. Additional ancillary findings as above without interval change.     US Renal Artery 01/30/23: Summary:  Renal:  Right: Patent right renal artery with high resistance waveforms         noted throughout without signifcant stenosis. Abnormal right         resisitive index with increased echogenicity within the  renal         parenchyma.  Left:  Patent left renal artery with high resistance waveforms noted         throughout without signifcant stenosis. Abnormal left         resisitive index with increased echogenicity within the renal         parenchyma.  Mesenteric:  Aortic dissection was noted.      EKG: EKG 08/23/23: Normal sinus rhythm ST & Marked T wave abnormality, consider anterolateral ischemia Abnormal ECG  EKG 07/11/23: Normal sinus rhythm Left axis deviation Left bundle branch block Abnormal ECG When compared with ECG of 05-Feb-2023 08:28, LBBB now seen Confirmed by Alverda Skeans ([id number]) on 07/11/2023 3:47:27 PM   EKG 02/05/23: Normal sinus rhythm Minimal voltage criteria for LVH, may be normal variant ( Cornell product ) Borderline ECG When compared with ECG of 29-Jan-2023 02:58, No significant change since last tracing Confirmed by Kristeen Miss [phone]) on 02/05/2023 10:49:25 AM     CV: US Carotid 07/11/23: Summary:  - Right Carotid: Velocities in the right ICA are  consistent with a 1-39% stenosis.  - Left Carotid: Velocities in the left ICA are consistent with a 1-39% stenosis.  - Vertebrals: Bilateral vertebral arteries demonstrate antegrade flow.  - Subclavians: Normal flow hemodynamics were seen in bilateral subclavian arteries.     Echo 01/30/23: IMPRESSIONS   1. Left ventricular ejection fraction, by estimation, is 55 to 60%. The  left ventricle has normal function. The left ventricle has no regional  wall motion abnormalities. There is mild concentric left ventricular  hypertrophy. Left ventricular diastolic  parameters are consistent with Grade II diastolic dysfunction  (pseudonormalization).   2. Right ventricular systolic function is normal. The right ventricular  size is normal. Tricuspid regurgitation signal is inadequate for assessing  PA pressure.   3. Left atrial size was mildly dilated.   4. The mitral valve is normal in structure. Mild to  moderate mitral valve  regurgitation. No evidence of mitral stenosis.   5. The aortic valve is calcified. Aortic valve regurgitation is not  visualized. Aortic valve sclerosis/calcification is present, without any  evidence of aortic stenosis.   6. There appears a flap in the abdominal aorta concerning for abdominal  aortic dissection - would recommend further testing with Abdominal CTA or  MRA.   7. The inferior vena cava is dilated in size with <50% respiratory  variability, suggesting right atrial pressure of 15 mmHg.      RHC/LHC 03/12/20: Findings: Ao = 175/101 (131) LV = 175/11 RA = 6 RV = 40/11 PA = 41/12 (26) PCW = 10 Fick cardiac output/index = 5.1/2.4 PVR = 3.2 WU Ao sat = 96% PA sat = 64%, 65%   Assessment: 1. Normal coronary arteries 2. LVEF 60-65% 3. Mild PAH likely due to OSA/OHS 4. Otherwise normal hemodynamics 4. Severe systemic HTN   Plan/Discussion: Medical therapy with focus on HTN control and weight loss.    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  Genes Analyzed (43 total): APC*, ATM*, AXIN2, BARD1, BMPR1A, BRCA1*, BRCA2*, BRIP1*, CDH1*, CDK4, CDKN2A, CHEK2*, DICER1, GALNT12, HOXB13,  MEN1, MLH1*, MRE11A, MSH2*, MSH3, MSH6*, MUTYH*, NBN, NF1*, NTHL1, PALB2*, PMS2*, POLD1, POLE, PTEN*, RAD50, RAD51C*, RAD51D*, RET, SDHB, SDHD, SMAD4, SMARCA4, STK11 and TP53* (sequencing and deletion/duplication); EGFR (s   GERD 07/24/2006   GRAVE'S DISEASE 01/01/2008   History of hidradenitis suppurativa    History of kidney stones    History of thrush    HIV DISEASE 07/24/2006   dx March 05   Hyperlipidemia    HYPERTENSION 07/24/2006   Hyperthyroidism 08/2006   Grave's Disease -diffuse radiotracer uptake 08/25/06 Thyroid scan-Cold nodule to R lower lobe of thyrorid   Ileus (HCC) 02/14/2023   Menometrorrhagia  hx of   Nephrolithiasis    Panniculitis 05/12/2020   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HCC)    METASTATIC PAPILLARY THYROID CARCINOMA per 01/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 Completed 2 yrs Prednisone after lung bx confirmation. No symptoms since then.   Sebaceous cyst 04/21/2020   Suppurative hidradenitis    Thyroid cancer (HCC)    THYROID NODULE, RIGHT 02/08/2007    Past Surgical History:  Procedure Laterality Date   APPLICATION OF WOUND VAC N/A 01/20/2021   Procedure: APPLICATION OF WOUND VAC;  Surgeon: Peggye Form, DO;  Location: [facility];  Service: Plastics;  Laterality: N/A;   BREAST EXCISIONAL BIOPSY Right 04/26/2018   right axilla negative   BREAST EXCISIONAL BIOPSY Left 04/26/2018   left axilla negative   BREAST LUMPECTOMY Right 10/03/2018   malignant   BREAST LUMPECTOMY WITH RADIOACTIVE SEED  AND SENTINEL LYMPH NODE BIOPSY Right 10/03/2018   Procedure: RIGHT BREAST LUMPECTOMY WITH RADIOACTIVE SEED AND SENTINEL LYMPH NODE MAPPING;  Surgeon: Harriette Bouillon, MD;  Location: [facility];  Service: General;  Laterality: Right;   BREAST SURGERY  1997   Breast Reduction    CYSTOSCOPY W/ URETERAL STENT REMOVAL  11/09/2012   Procedure: CYSTOSCOPY WITH STENT REMOVAL;  Surgeon: Sebastian Ache, MD;  Location: [facility];  Service: Urology;  Laterality: Right;   CYSTOSCOPY WITH RETROGRADE PYELOGRAM, URETEROSCOPY AND STENT PLACEMENT  11/09/2012   Procedure: CYSTOSCOPY WITH RETROGRADE PYELOGRAM, URETEROSCOPY AND STENT PLACEMENT;  Surgeon: Sebastian Ache, MD;  Location: [facility];  Service: Urology;  Laterality: Left;  LEFT URETEROSCOPY, STONE MANIPULATION, left STENT exchange    CYSTOSCOPY WITH STENT PLACEMENT  10/02/2012   Procedure: CYSTOSCOPY WITH STENT PLACEMENT;  Surgeon: Sebastian Ache, MD;  Location: [facility];  Service: Urology;  Laterality: Left;   DEBRIDEMENT AND CLOSURE WOUND N/A 01/20/2021   Procedure: Excision of abdominal wound with closure;  Surgeon: Peggye Form, DO;  Location: [facility];  Service: Plastics;  Laterality: N/A;   DILATION AND CURETTAGE OF UTERUS  11/2002   s/p for 1st trimester nonviable pregnancy   EYE SURGERY     sty under eyelid   INCISE AND DRAIN ABCESS  08/2002   s/p I &D for righ inframmary fold hidradenitis   INCISION AND DRAINAGE PERITONSILLAR ABSCESS  12/2001   IR CV LINE INJECTION  06/07/2018   IR FLUORO GUIDE CV LINE RIGHT  01/30/2023   IR IMAGING GUIDED PORT INSERTION  06/20/2018   IR REMOVAL TUN ACCESS W/ PORT W/O FL MOD SED  06/20/2018   IR US GUIDE VASC ACCESS RIGHT  01/30/2023   IRRIGATION AND DEBRIDEMENT ABSCESS  01/31/2012   Procedure: IRRIGATION AND DEBRIDEMENT ABSCESS;  Surgeon: Kandis Cocking, MD;  Location: [facility];  Service: General;  Laterality: Right;  right breast and axilla    NEPHROLITHOTOMY  10/02/2012   Procedure:  NEPHROLITHOTOMY PERCUTANEOUS;  Surgeon: Sebastian Ache, MD;  Location[facility];  Service: Urology;  Laterality: Right;  First Stage Percutaneous Nephrolithotomy with Surgeon Access, Left Ureteral Stent  NEPHROLITHOTOMY  10/04/2012   Procedure: NEPHROLITHOTOMY PERCUTANEOUS SECOND LOOK;  Surgeon: Sebastian Ache, MD;  [facility];  Service: Urology;  Laterality: Right;      NEPHROLITHOTOMY  10/08/2012   Procedure: NEPHROLITHOTOMY PERCUTANEOUS;  Surgeon: Sebastian Ache, MD;  Location: [facility];  Service: Urology;  Laterality: Right;  THIRD STAGE, nephrostomy tube exchange x 2   NEPHROLITHOTOMY  10/11/2012   Procedure: NEPHROLITHOTOMY PERCUTANEOUS SECOND LOOK;  Surgeon: Sebastian Ache, MD;  [facility];  Service: Urology;  Laterality: Right;  RIGHT 4 STAGE PERCUTANOUS NEPHROLITHOTOMY, right URETEROSCOPY WITH HOLMIUM LASER    PANNICULECTOMY N/A 12/21/2020   Procedure: PANNICULECTOMY;  Surgeon: Peggye Form, DO;  [facility];  Service: Plastics;  Laterality: N/A;   PERCUTANEOUS NEPHROSTOLITHOTOMY  04/2022   PORT-A-CATH REMOVAL N/A 07/16/2020   Procedure: REMOVAL PORT-A-CATH;  Surgeon: Harriette Bouillon, MD;  Location: [facility];  Service: General;  Laterality: N/A;   PORTACATH PLACEMENT Left 05/17/2018   Procedure: INSERTION PORT-A-CATH;  Surgeon: Abigail Miyamoto, MD;  Location: [facility];  Service: General;  Laterality: Left;   RADICAL NECK DISSECTION  04/12/2017   limited/notes 04/12/2017   RADICAL NECK DISSECTION N/A 04/12/2017   Procedure: RADICAL NECK DISSECTION;  Surgeon: Christia Reading, MD;  Location: [facility] OR;  Service: ENT;  Laterality: N/A;  limited neck dissection 2 hours total   REDUCTION MAMMAPLASTY Bilateral 1998   RIGHT/LEFT HEART CATH AND CORONARY ANGIOGRAPHY N/A 03/12/2020   Procedure: RIGHT/LEFT HEART CATH AND CORONARY ANGIOGRAPHY;  Surgeon: Dolores Patty, MD;  [facility];  Service: Cardiovascular;  Laterality:  N/A;   [place]  1994   THORACIC AORTIC ENDOVASCULAR STENT GRAFT N/A 02/02/2023   Procedure: THORACIC AORTIC ENDOVASCULAR STENT GRAFT;  Surgeon: Victorino Sparrow, MD;  Location: [facility] OR;  Service: Vascular;  Laterality: N/A;   THYROIDECTOMY  04/12/2017   completion/notes 04/12/2017   THYROIDECTOMY N/A 04/12/2017   Procedure: THYROIDECTOMY;  Surgeon: Christia Reading, MD;  [facility] OR;  Service: ENT;  Laterality: N/A;  Completion Thyroidectomy   TOTAL THYROIDECTOMY  2010    MEDICATIONS:  acetaminophen (TYLENOL) 500 MG tablet   albuterol (VENTOLIN HFA) 108 (90 Base) MCG/ACT inhaler   allopurinol (ZYLOPRIM) 100 MG tablet   amLODipine (NORVASC) 5 MG tablet   aspirin 81 MG chewable tablet   AURYXIA 1 GM 210 MG(Fe) tablet   Blood Glucose Monitoring Suppl (FREESTYLE LITE) w/Device KIT   carvedilol (COREG) 25 MG tablet   dolutegravir ([name]) 50 MG tablet   emtricitabine-tenofovir AF (DESCOVY) 200-25 MG tablet   fluticasone (FLOVENT HFA) 110 MCG/ACT inhaler   Fluticasone Furoate (ARNUITY ELLIPTA) 100 MCG/ACT AEPB   Lancets (ONETOUCH ULTRASOFT) lancets   levocetirizine (XYZAL) 5 MG tablet   levothyroxine (SYNTHROID) 175 MCG tablet   losartan (COZAAR) 50 MG tablet   metoCLOPramide (REGLAN) 5 MG tablet   minocycline (DYNACIN) 100 MG tablet   omeprazole (PRILOSEC) 40 MG capsule   ondansetron (ZOFRAN) 4 MG tablet   rosuvastatin (CRESTOR) 10 MG tablet   saxagliptin HCl (ONGLYZA) 2.5 MG TABS tablet   traMADol (ULTRAM) 50 MG tablet   No current facility-administered medications for this encounter.    Shonna Chock, PA-C Surgical Short Stay/Anesthesiology [facility] Phone [phone] [facility] Phone [phone] 08/24/2023 10:10 AM

## 2023-08-24 NOTE — Anesthesia Preprocedure Evaluation (Addendum)
Anesthesia Evaluation  Patient identified by MRN, date of birth, ID band Patient awake    Reviewed: Allergy & Precautions, NPO status , Patient's Chart, lab work & pertinent test results, reviewed documented beta blocker date and time   History of Anesthesia Complications Negative for: history of anesthetic complications  Airway Mallampati: III  TM Distance: >3 FB Neck ROM: Limited    Dental  (+) Poor Dentition   Pulmonary asthma , pneumonia, former smoker   breath sounds clear to auscultation + decreased breath sounds      Cardiovascular hypertension, + Peripheral Vascular Disease  (-) Orthopnea + Valvular Problems/Murmurs MR  Rhythm:Regular Rate:Normal  Normal EF, mod MR   Neuro/Psych  PSYCHIATRIC DISORDERS Anxiety Depression       GI/Hepatic ,GERD  ,,(+) neg Cirrhosis        Endo/Other  diabetes, Type 2Hypothyroidism Hyperthyroidism   Renal/GU ESRF and DialysisRenal disease     Musculoskeletal  (+) Arthritis ,    Abdominal   Peds  Hematology  (+) Blood dyscrasia, anemia   Anesthesia Other Findings   Reproductive/Obstetrics                              Anesthesia Physical Anesthesia Plan  ASA: 4  Anesthesia Plan: General   Post-op Pain Management:    Induction: Intravenous  PONV Risk Score and Plan: 3 and Ondansetron, Dexamethasone and Propofol infusion  Airway Management Planned: Oral ETT and Video Laryngoscope Planned  Additional Equipment: Arterial line, ClearSight, TEE, Ultrasound Guidance Line Placement and Spinal Drain  Intra-op Plan:   Post-operative Plan: Post-operative intubation/ventilation  Informed Consent: I have reviewed the patients History and Physical, chart, labs and discussed the procedure including the risks, benefits and alternatives for the proposed anesthesia with the patient or authorized representative who has indicated his/her understanding and  acceptance.     Dental advisory given  Plan Discussed with: CRNA  Anesthesia Plan Comments: (PAT note written 08/24/2023 by Shonna Chock, PA-C.  )        Anesthesia Quick Evaluation

## 2023-08-24 NOTE — H&P (Signed)
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  Dr. Sherral Hammers reviewed the scans and felt that placement of another endovascular stent would first require de-branching of the aortic arch to allow the stent graft to cross the entire aortic arch with an adequate proximal landing zone.   The patient tells me that she has had no chest or back pain.  She has been doing well with dialysis.  She does report some vague symptoms in her lower extremities that she describes as severe pain and inability to move her extremities.  She said this is not related to any specific activity and not related to dialysis.  She is still able to stand on her legs but does not feel like she can move.  She usually has to hold onto something or sit down.  It involves both legs.  She reports the pain as being the whole way up and down her legs.  She was  scheduled for aortic arch de-branching and stent grafting across the are in September but developed an acute abscess under her right breast due to chronic hidradinitis. Surgery was therefore postponed and she was treated with a course of antibiotics. I saw her back in the office recently and this had resolved.       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  Genes Analyzed (  43 total): APC*, ATM*, AXIN2, BARD1, BMPR1A, BRCA1*, BRCA2*, BRIP1*, CDH1*, CDK4, CDKN2A, CHEK2*, DICER1, GALNT12, HOXB13, MEN1, MLH1*, MRE11A, MSH2*, MSH3, MSH6*, MUTYH*, NBN, NF1*, NTHL1, PALB2*, PMS2*, POLD1, POLE, PTEN*, RAD50, RAD51C*, RAD51D*, RET, SDHB, SDHD, SMAD4, SMARCA4, STK11 and TP53* (sequencing and deletion/duplication); EGFR (s   GERD 07/24/2006   GRAVE'S DISEASE 01/01/2008   History of hidradenitis suppurativa     History of kidney stones     History of thrush     HIV DISEASE 07/24/2006    dx March 05   Hyperlipidemia     HYPERTENSION 07/24/2006   Hyperthyroidism 08/2006    Grave's Disease -diffuse radiotracer uptake 08/25/06 Thyroid scan-Cold nodule to R lower lobe of thyrorid   Ileus (HCC) 02/14/2023    Menometrorrhagia      hx of   Nephrolithiasis     Panniculitis 05/12/2020   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HCC)      METASTATIC PAPILLARY THYROID CARCINOMA per 01/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 Completed 2 yrs Prednisone after lung bx confirmation. No symptoms since then.   Sebaceous cyst 04/21/2020   Suppurative hidradenitis     Thyroid cancer (HCC)     THYROID NODULE, RIGHT 02/08/2007               Past Surgical History:  Procedure Laterality Date   APPLICATION OF WOUND VAC N/A 01/20/2021    Procedure: APPLICATION OF WOUND VAC;  Surgeon: Peggye Form, DO;  Location: [facility];  Service: Plastics;  Laterality: N/A;   BREAST EXCISIONAL BIOPSY Right 04/26/2018    right axilla negative   BREAST EXCISIONAL BIOPSY Left 04/26/2018    left axilla negative   BREAST LUMPECTOMY Right 10/03/2018    malignant   BREAST LUMPECTOMY WITH RADIOACTIVE SEED AND SENTINEL LYMPH NODE BIOPSY Right 10/03/2018    Procedure: RIGHT BREAST LUMPECTOMY WITH RADIOACTIVE SEED AND SENTINEL LYMPH NODE MAPPING;  Surgeon: Harriette Bouillon, MD;  Location: [facility];  Service: General;  Laterality: Right;   BREAST SURGERY   1997    Breast Reduction    CYSTOSCOPY W/ URETERAL STENT REMOVAL   11/09/2012    Procedure: CYSTOSCOPY WITH STENT REMOVAL;  Surgeon: Sebastian Ache, MD;  Location: [facility];  Service: Urology;  Laterality: Right;   CYSTOSCOPY WITH RETROGRADE PYELOGRAM, URETEROSCOPY AND STENT PLACEMENT   11/09/2012    Procedure: CYSTOSCOPY WITH RETROGRADE PYELOGRAM, URETEROSCOPY AND  STENT PLACEMENT;  Surgeon: Sebastian Ache, MD;  Location: [facility];  Service: Urology;  Laterality: Left;  LEFT URETEROSCOPY, STONE MANIPULATION, left STENT exchange     CYSTOSCOPY WITH STENT PLACEMENT   10/02/2012    Procedure: CYSTOSCOPY WITH STENT PLACEMENT;  Surgeon: Sebastian Ache, MD;  Location: [facility];  Service: Urology;  Laterality: Left;   DEBRIDEMENT AND CLOSURE WOUND N/A 01/20/2021    Procedure: Excision of abdominal wound with closure;  Surgeon: Peggye Form, DO;  Location: [facility];  Service: Plastics;  Laterality: N/A;   DILATION AND CURETTAGE OF UTERUS   11/2002    s/p for 1st trimester  nonviable pregnancy   EYE SURGERY        sty under eyelid   INCISE AND DRAIN ABCESS   08/2002    s/p I &D for righ inframmary fold hidradenitis   INCISION AND DRAINAGE PERITONSILLAR ABSCESS   12/2001   IR CV LINE INJECTION   06/07/2018   IR FLUORO GUIDE CV LINE RIGHT   01/30/2023   IR IMAGING GUIDED PORT INSERTION   06/20/2018   IR REMOVAL TUN ACCESS W/ PORT W/O FL MOD SED   06/20/2018   IR US GUIDE VASC ACCESS RIGHT   01/30/2023   IRRIGATION AND DEBRIDEMENT ABSCESS   01/31/2012    Procedure: IRRIGATION AND DEBRIDEMENT ABSCESS;  Surgeon: Kandis Cocking, MD;  Location: [facility];  Service: General;  Laterality: Right;  right breast and axilla    NEPHROLITHOTOMY   10/02/2012    Procedure: NEPHROLITHOTOMY PERCUTANEOUS;  Surgeon: Sebastian Ache, MD;  Location: [facility];  Service: Urology;  Laterality: Right;  First Stage Percutaneous Nephrolithotomy with Surgeon Access, Left Ureteral Stent      NEPHROLITHOTOMY   10/04/2012    Procedure: NEPHROLITHOTOMY PERCUTANEOUS SECOND LOOK;  Surgeon: Sebastian Ache, MD;  Location: [facility];  Service: Urology;  Laterality: Right;       NEPHROLITHOTOMY   10/08/2012    Procedure: NEPHROLITHOTOMY PERCUTANEOUS;  Surgeon: Sebastian Ache, MD;  Location: [facility];  Service: Urology;  Laterality: Right;  THIRD STAGE, nephrostomy tube exchange x 2    NEPHROLITHOTOMY   10/11/2012    Procedure: NEPHROLITHOTOMY PERCUTANEOUS SECOND LOOK;  Surgeon: Sebastian Ache, MD;  Location: [facility];  Service: Urology;  Laterality: Right;  RIGHT 4 STAGE PERCUTANOUS NEPHROLITHOTOMY, right URETEROSCOPY WITH HOLMIUM LASER    PANNICULECTOMY N/A 12/21/2020    Procedure: PANNICULECTOMY;  Surgeon: Peggye Form, DO;  Location: [facility];  Service: Plastics;  Laterality: N/A;   PERCUTANEOUS NEPHROSTOLITHOTOMY   04/2022   PORT-A-CATH REMOVAL N/A 07/16/2020    Procedure: REMOVAL PORT-A-CATH;  Surgeon: Harriette Bouillon, MD;  Location: [facility];  Service: General;  Laterality: N/A;   PORTACATH PLACEMENT Left 05/17/2018    Procedure: INSERTION PORT-A-CATH;  Surgeon: Abigail Miyamoto, MD;  Location: [facility];  Service: General;  Laterality: Left;   RADICAL NECK DISSECTION   04/12/2017    limited/notes 04/12/2017   RADICAL NECK DISSECTION N/A 04/12/2017    Procedure: RADICAL NECK DISSECTION;  Surgeon: Christia Reading, MD;  Location: [facility] OR;  Service: ENT;  Laterality: N/A;  limited neck dissection 2 hours total   REDUCTION MAMMAPLASTY Bilateral 1998   RIGHT/LEFT HEART CATH AND CORONARY ANGIOGRAPHY N/A 03/12/2020    Procedure: RIGHT/LEFT HEART CATH AND CORONARY ANGIOGRAPHY;  Surgeon: Dolores Patty, MD;  [facility]: [facility];  Service: Cardiovascular;  Laterality: N/A;   [place]   1994   THORACIC AORTIC ENDOVASCULAR STENT GRAFT N/A 02/02/2023    Procedure: THORACIC AORTIC ENDOVASCULAR STENT GRAFT;  Surgeon: Victorino Sparrow, MD;  Location[facility];  Service: Vascular;  Laterality: N/A;   THYROIDECTOMY   04/12/2017    completion/notes 04/12/2017   THYROIDECTOMY N/A 04/12/2017    Procedure: THYROIDECTOMY;  Surgeon: Christia Reading, MD;  [facility];  Service: ENT;  Laterality: N/A;  Completion Thyroidectomy   TOTAL THYROIDECTOMY   2010               Family History  Problem Relation Age of Onset   Hypertension Mother      Cancer Mother  laryngeal   Heart disease Mother          stent   Pancreatic cancer Father     Hypertension Father     Lung cancer Father 55        hx smoking   Hypertension Sister     Hypertension Sister     Hypertension Brother     Breast cancer Maternal Aunt 46   Breast cancer Maternal Aunt          dx 60+   Cancer Maternal Uncle          Lung CA   Breast cancer Paternal Aunt 67   Breast cancer Paternal Aunt          dx 67's   Breast cancer Paternal Aunt          dx 50's   Prostate cancer Paternal Uncle     Prostate cancer Paternal Uncle     Lung cancer Paternal Uncle     Breast cancer Cousin 40   Breast cancer Cousin          dx <50   Breast cancer Cousin          dx <50   Breast cancer Cousin          dx <50   Heart disease Other     Hypertension Other     Stroke Other          Grandparent   Kidney disease Other          Grandparent   Diabetes Other          FH of Diabetes   Colon cancer Neg Hx     Esophageal cancer Neg Hx     Rectal cancer Neg Hx     Stomach cancer Neg Hx            Social History Social History  Social History         Tobacco Use   Smoking status: Former      Current packs/day: 0.00      Average packs/day: 0.5 packs/day for 15.0 years (7.5 ttl pk-yrs)      Types: Cigarettes      Start date: 04/12/2017      Quit date: 2019      Years since quitting: 5.6   Smokeless tobacco: Never  Vaping Use   Vaping status: Never Used  Substance Use Topics   Alcohol use: Yes      Alcohol/week: 0.0 standard drinks of alcohol      Comment: social   Drug use: No              Current Outpatient Medications  Medication Sig Dispense Refill   acetaminophen (TYLENOL) 500 MG tablet Take 1,000 mg by mouth every 6 (six) hours as needed for mild pain.       albuterol (VENTOLIN HFA) 108 (90 Base) MCG/ACT inhaler Inhale 2 puffs into the lungs every 6 (six) hours as needed for wheezing or shortness of breath. 8 g 0   allopurinol (ZYLOPRIM) 100  MG tablet TAKE 1 TABLET BY MOUTH EVERY DAY FOR GOUT PREVENTION 90 tablet 2   amLODipine (NORVASC) 10 MG tablet Take 10 mg by mouth daily.       AURYXIA 1 GM 210 MG(Fe) tablet Take 420 mg by mouth 3 (three) times daily.       B Complex-C-Folic Acid (DIALYVITE 800) 0.8 MG TABS Take 1 tablet by mouth daily.  Blood Glucose Monitoring Suppl (FREESTYLE LITE) w/Device KIT Inject 1 each into the skin daily. 1 kit 0   carvedilol (COREG) 25 MG tablet Take 1 tablet (25 mg total) by mouth 2 (two) times daily. 180 tablet 3   dolutegravir ([name]) 50 MG tablet Take 1 tablet (50 mg total) by mouth daily. 30 tablet 11   emtricitabine-tenofovir AF (DESCOVY) 200-25 MG tablet Take 1 tablet by mouth daily. 30 tablet 11   fluticasone (FLOVENT HFA) 110 MCG/ACT inhaler Inhale 1 puff into the lungs 2 (two) times daily. TAKE 1 PUFF BY MOUTH TWICE A DAY Strength: 110 MCG/ACT 3 each 1   Lancets (ONETOUCH ULTRASOFT) lancets Use as instructed to test blood sugar daily 100 each 5   levocetirizine (XYZAL) 5 MG tablet Take 1 tablet (5 mg total) by mouth every evening. For allergies 90 tablet 3   levothyroxine (SYNTHROID) 175 MCG tablet TAKE 1 TABLET BY MOUTH EVERY DAY 30 tablet 7   metoCLOPramide (REGLAN) 5 MG tablet Take 1 tablet (5 mg total) by mouth every 6 (six) hours as needed for nausea. 20 tablet 0   minocycline (DYNACIN) 100 MG tablet Take 1 tablet (100 mg total) by mouth 2 (two) times daily. 60 tablet 11   omeprazole (PRILOSEC) 40 MG capsule Take 1 capsule (40 mg total) by mouth 2 (two) times daily before a meal. 180 capsule 0   ondansetron (ZOFRAN) 4 MG tablet Take 1 tablet (4 mg total) by mouth daily as needed for nausea or vomiting. 20 tablet 0   rosuvastatin (CRESTOR) 10 MG tablet TAKE 1 TABLET BY MOUTH EVERY DAY FOR CHOLESTEROL (Patient taking differently: Take 10 mg by mouth daily. FOR CHOLESTEROL) 90 tablet 2   saxagliptin HCl (ONGLYZA) 2.5 MG TABS tablet Take 1 tablet (2.5 mg total) by mouth daily. for  diabetes. 90 tablet 1   traMADol (ULTRAM) 50 MG tablet Take 1 tablet (50 mg total) by mouth every 6 (six) hours as needed for moderate pain. 20 tablet 0   losartan (COZAAR) 50 MG tablet Take 1 tablet (50 mg total) by mouth daily. for blood pressure. 90 tablet 3      No current facility-administered medications for this visit.        Allergies       Allergies  Allergen Reactions   Genvoya [Elviteg-Cobic-Emtricit-Tenofaf] Hives   Lisinopril Cough   Tape Rash      Rash   Okay with tegaderm and paper tape   Aldactone [Spironolactone] Hives   Tegaderm Ag Mesh [Silver] Itching        Review of Systems  Constitutional:  Positive for activity change. Negative for chills, fatigue and fever.  HENT: Negative.    Eyes: Negative.   Respiratory:  Negative for shortness of breath.   Cardiovascular:  Negative for chest pain and leg swelling.  Gastrointestinal: Negative.   Endocrine: Negative.   Genitourinary: Negative.   Musculoskeletal: Negative.   Neurological:  Positive for weakness. Negative for dizziness, tremors, seizures, syncope, speech difficulty, light-headedness, numbness and headaches.  Hematological: Negative.   Psychiatric/Behavioral: Negative.        BP 112/62 (BP Location: Left Arm, Patient Position: Sitting)   Pulse 70   Resp 20   Ht 5\' 5"  (1.651 m)   Wt 179 lb (81.2 kg)   LMP 03/31/2014 (LMP Unknown)   SpO2 99% Comment: RA  BMI 29.79 kg/m  Physical Exam Constitutional:      Appearance: Normal appearance. She is normal weight.  HENT:  Head: Normocephalic and atraumatic.  Eyes:     Extraocular Movements: Extraocular movements intact.     Conjunctiva/sclera: Conjunctivae normal.     Pupils: Pupils are equal, round, and reactive to light.  Neck:     Vascular: No carotid bruit.  Cardiovascular:     Rate and Rhythm: Normal rate and regular rhythm.     Pulses: Normal pulses.     Heart sounds: Normal heart sounds. No murmur heard. Pulmonary:     Effort:  Pulmonary effort is normal.     Breath sounds: Normal breath sounds.  Abdominal:     General: Bowel sounds are normal. There is no distension.     Tenderness: There is no abdominal tenderness.  Musculoskeletal:        General: No swelling.     Cervical back: No tenderness.  Skin:    General: Skin is warm and dry.  Neurological:     General: No focal deficit present.     Mental Status: She is alert and oriented to person, place, and time.  Psychiatric:        Mood and Affect: Mood normal.        Behavior: Behavior normal.          Diagnostic Tests: Narrative & Impression  CLINICAL DATA:  Thoracoabdominal aortic dissection status post TEVAR.   EXAM: CT ANGIOGRAPHY CHEST, ABDOMEN AND PELVIS   TECHNIQUE: Non-contrast CT of the chest was initially obtained.   Multidetector CT imaging through the chest, abdomen and pelvis was performed using the standard protocol during bolus administration of intravenous contrast. Multiplanar reconstructed images and MIPs were obtained and reviewed to evaluate the vascular anatomy.   RADIATION DOSE REDUCTION: This exam was performed according to the departmental dose-optimization program which includes automated exposure control, adjustment of the mA and/or kV according to patient size and/or use of iterative reconstruction technique.   CONTRAST:  OMNIPAQUE IOHEXOL 350 MG/ML SOLN   COMPARISON:  Prior CT scan of the chest abdomen and pelvis 02/07/2023   FINDINGS: CTA CHEST FINDINGS   Cardiovascular: 2 vessel aortic arch. The right brachiocephalic and left common carotid artery share a common origin. Significant interval change in the appearance of the proximal portion of the previously placed thoracic stent graft. The stent graft has significantly increased in diameter from approximately 2.6 cm to 4.7 cm. Additionally, there is new ulceration along the undersurface of the aortic isthmus at the proximal landing zone. The  ulceration measures approximately 2.1 x 1.7 x 0.7 cm. The stent grafts extend inferiorly to the aortic isthmus. There is some persistent retrograde filling of the false lumen in the descending thoracic aorta. Aneurysmal dilation measures 4.8 cm.   Right IJ tunneled hemodialysis catheter in good position with the tip in the right atrium. The main pulmonary artery is normal in size. The heart is normal in size. Trace pericardial effusion.   Mediastinum/Nodes: Unremarkable CT appearance of the thyroid gland. No suspicious mediastinal or hilar adenopathy. No soft tissue mediastinal mass. The thoracic esophagus is unremarkable.   Lungs/Pleura: Stable mild subpleural reticulation and linear scarring in the anterior right upper lobe and right middle lobe. Trace dependent atelectasis bilaterally. No suspicious pulmonary mass or nodule.   Musculoskeletal: No acute fracture or aggressive appearing lytic or blastic osseous lesion.   Review of the MIP images confirms the above findings.   CTA ABDOMEN AND PELVIS FINDINGS   VASCULAR   Aorta: Dissection flap extends throughout the abdominal aorta as previously seen. Increasing diameter  of the dissected aorta in the visceral segment measuring 3.6 cm at the origin the celiac artery compared to 3.2 cm previously. The aorta then tapers to normal caliber.   Celiac: Arises from the true lumen. The dissection extends just into the origin of the celiac artery but does not result in flow limitation.   SMA: Arises from the true lumen.  No involvement.   Renals: The right renal artery arises from the true lumen. The left renal artery is partially involved by the dissection flap and there is slightly decreased flow as previously noted. There appears to be some interval healing. The artery remains patent.   IMA: Patent without evidence of aneurysm, dissection, vasculitis or significant stenosis.   Inflow: Dissection flap extends into the iliac  system on the left. The dissection extends all the way into the femoral system. No involvement on the right. No propagation.   Veins: No focal venous abnormality.   Review of the MIP images confirms the above findings.   NON-VASCULAR   Hepatobiliary: Normal hepatic contour and morphology. No discrete hepatic lesion. No intra or extrahepatic biliary ductal dilatation. Focal thickening of the gallbladder wall where it abuts the liver parenchyma measures 1.7 x 1.3 cm. This has previously been evaluated by MRI and found to represent focal adenomyomatosis.   Pancreas: Unremarkable. No pancreatic ductal dilatation or surrounding inflammatory changes.   Spleen: Normal in size without focal abnormality.   Adrenals/Urinary Tract: Mild adreniform thickening of the left gland. The right gland is normal. Diffuse mild cortical atrophy of the right kidney. Mild caliectasis in the lower pole of the right kidney with a cluster of small nonobstructing stones again noted. No enhancing renal mass. The ureters and bladder are unremarkable.   Stomach/Bowel: No focal bowel wall thickening or evidence of obstruction. Colonic diverticular disease without CT evidence of active inflammation.   Lymphatic: No suspicious lymphadenopathy.   Reproductive: Uterus and bilateral adnexa are unremarkable.   Other: Small fat containing umbilical hernia.  No ascites.   Musculoskeletal: No acute fracture or aggressive appearing lytic or blastic osseous lesion.   Review of the MIP images confirms the above findings.   IMPRESSION: 1. Interval change in the appearance and configuration of the most proximal thoracic aortic stent graft. The stent graft has expanded from 2.6 cm in diameter in April of 2024 to 4.7 cm in diameter on today's examination. Additionally, there is a new focal outpouching of contrast material along the aortic infundibulum at the proximal landing zone concerning for  ulceration/pseudoaneurysm development. 2. Persistent retrograde filling of the false lumen with mildly increased size of the descending thoracic aorta to 4.8 cm. 3. Similarly, there is a mild enlargement of the visceral aorta now measuring up to 3.6 cm compared to 3.2 cm previously at the origin of the celiac artery. 4. Similar extent of abdominal aortic dissection flap extending through the iliac system on the left and into the common femoral artery. 5. Slight interval improvement in the appearance of the dissection involvement of the left renal artery. There is persistent stenosis but the artery remains patent and the flow channel seems to be improving. 6. Additional ancillary findings as above without interval change.   These results were called by telephone at the time of interpretation on 05/09/2023 at 9:13 am to provider Dr. Lerry Paterson, Who verbally acknowledged these results.   Signed,   Sterling Big, MD, RPVI   [facility]   [facility]  Electronically Signed   By: Malachy Moan M.D.   On: 05/09/2023 09:13        Impression:   She has had some slippage of her thoracic stent graft from zone 2 into zone 3 with aneurysmal degeneration of the aortic arch proximal to the stent graft with possible pseudoaneurysm formation.  It is also possible that this area is the initial tear from her aortic dissection but it is not clear.  There is significant bird beaking of the proximal end of the stent graft.  There is persistent retrograde filling of the false lumen with mildly increased size of the descending thoracic aorta at 4.8 cm.  It does not appear that the distal end of the stent graft is fully expanded due to the intimal flap.  I agree with Dr. Karin Lieu that the best option is to proceed with aortic arch de-branching via median sternotomy with bypasses to the innominate and left common carotid arteries followed by stent  grafting across the aortic arch.  Dr. Sherral Hammers will then decide whether further intervention is needed for the distal portion of the stent graft and the retrograde filling of the false lumen.  I think her operative risk is somewhat increased due to her comorbid risk factors including renal failure on dialysis and diffuse vascular disease.  Cardiac catheterization in 2021 showed normal coronary arteries with normal left ventricular systolic function.  2D echocardiogram in April 2024 showed an ejection fraction of 55 to 60% with mild concentric LVH and grade 2 diastolic dysfunction.  There is no significant aortic valve disease.  There is mild to moderate mitral valve regurgitation. I discussed the operative procedure with the patient including alternatives, benefits and risks; including but not limited to bleeding, blood transfusion, infection, stroke, myocardial infarction, graft failure, heart block requiring a permanent pacemaker, organ dysfunction, paresis or paralysis of the lower extremities, and death.  [name] understands and agrees to proceed.       Plan:   Median sternotomy for aortic arch de-branching and stent grafting across the aortic arch.       [name], MD [facility] [phone]

## 2023-08-25 ENCOUNTER — Other Ambulatory Visit: Payer: Self-pay

## 2023-08-25 ENCOUNTER — Inpatient Hospital Stay (HOSPITAL_COMMUNITY): Payer: Medicare HMO

## 2023-08-25 ENCOUNTER — Inpatient Hospital Stay (HOSPITAL_COMMUNITY)
Admission: RE | Admit: 2023-08-25 | Discharge: 2023-09-03 | DRG: 219 | Disposition: A | Discharge status: Home Health | Payer: Medicare HMO | Attending: Surgery | Admitting: Surgery

## 2023-08-25 ENCOUNTER — Other Ambulatory Visit: Payer: Self-pay | Admitting: Vascular Surgery

## 2023-08-25 ENCOUNTER — Inpatient Hospital Stay (HOSPITAL_COMMUNITY): Payer: Medicare HMO | Admitting: Vascular Surgery

## 2023-08-25 ENCOUNTER — Inpatient Hospital Stay (HOSPITAL_COMMUNITY): Payer: Medicare HMO | Admitting: Anesthesiology

## 2023-08-25 ENCOUNTER — Encounter (HOSPITAL_COMMUNITY): Payer: Self-pay | Admitting: Surgery

## 2023-08-25 ENCOUNTER — Encounter (HOSPITAL_COMMUNITY)
Admission: RE | Disposition: A | Discharge status: Home Health | Payer: Self-pay | Source: Home / Self Care | Attending: Surgery

## 2023-08-25 DIAGNOSIS — I82B12 Acute embolism and thrombosis of left subclavian vein: Secondary | ICD-10-CM | POA: Diagnosis not present

## 2023-08-25 DIAGNOSIS — Z79899 Other long term (current) drug therapy: Secondary | ICD-10-CM | POA: Diagnosis not present

## 2023-08-25 DIAGNOSIS — Z823 Family history of stroke: Secondary | ICD-10-CM

## 2023-08-25 DIAGNOSIS — Z8042 Family history of malignant neoplasm of prostate: Secondary | ICD-10-CM

## 2023-08-25 DIAGNOSIS — E890 Postprocedural hypothyroidism: Secondary | ICD-10-CM | POA: Diagnosis present

## 2023-08-25 DIAGNOSIS — Y712 Prosthetic and other implants, materials and accessory cardiovascular devices associated with adverse incidents: Secondary | ICD-10-CM | POA: Diagnosis present

## 2023-08-25 DIAGNOSIS — Z8249 Family history of ischemic heart disease and other diseases of the circulatory system: Secondary | ICD-10-CM

## 2023-08-25 DIAGNOSIS — I82C12 Acute embolism and thrombosis of left internal jugular vein: Secondary | ICD-10-CM | POA: Diagnosis not present

## 2023-08-25 DIAGNOSIS — N2581 Secondary hyperparathyroidism of renal origin: Secondary | ICD-10-CM | POA: Diagnosis present

## 2023-08-25 DIAGNOSIS — Z8616 Personal history of COVID-19: Secondary | ICD-10-CM | POA: Diagnosis not present

## 2023-08-25 DIAGNOSIS — I82622 Acute embolism and thrombosis of deep veins of left upper extremity: Secondary | ICD-10-CM | POA: Diagnosis not present

## 2023-08-25 DIAGNOSIS — M898X9 Other specified disorders of bone, unspecified site: Secondary | ICD-10-CM | POA: Diagnosis present

## 2023-08-25 DIAGNOSIS — Z8679 Personal history of other diseases of the circulatory system: Principal | ICD-10-CM

## 2023-08-25 DIAGNOSIS — D631 Anemia in chronic kidney disease: Secondary | ICD-10-CM | POA: Diagnosis present

## 2023-08-25 DIAGNOSIS — I120 Hypertensive chronic kidney disease with stage 5 chronic kidney disease or end stage renal disease: Secondary | ICD-10-CM | POA: Diagnosis present

## 2023-08-25 DIAGNOSIS — Z801 Family history of malignant neoplasm of trachea, bronchus and lung: Secondary | ICD-10-CM

## 2023-08-25 DIAGNOSIS — Z87442 Personal history of urinary calculi: Secondary | ICD-10-CM

## 2023-08-25 DIAGNOSIS — Z9889 Other specified postprocedural states: Principal | ICD-10-CM

## 2023-08-25 DIAGNOSIS — Z992 Dependence on renal dialysis: Secondary | ICD-10-CM | POA: Diagnosis not present

## 2023-08-25 DIAGNOSIS — I700 Atherosclerosis of aorta: Secondary | ICD-10-CM | POA: Diagnosis present

## 2023-08-25 DIAGNOSIS — E662 Morbid (severe) obesity with alveolar hypoventilation: Secondary | ICD-10-CM | POA: Diagnosis present

## 2023-08-25 DIAGNOSIS — T82320A Displacement of aortic (bifurcation) graft (replacement), initial encounter: Principal | ICD-10-CM | POA: Diagnosis present

## 2023-08-25 DIAGNOSIS — E785 Hyperlipidemia, unspecified: Secondary | ICD-10-CM | POA: Diagnosis present

## 2023-08-25 DIAGNOSIS — I340 Nonrheumatic mitral (valve) insufficiency: Secondary | ICD-10-CM | POA: Diagnosis present

## 2023-08-25 DIAGNOSIS — Z9221 Personal history of antineoplastic chemotherapy: Secondary | ICD-10-CM

## 2023-08-25 DIAGNOSIS — Z79890 Hormone replacement therapy (postmenopausal): Secondary | ICD-10-CM | POA: Diagnosis not present

## 2023-08-25 DIAGNOSIS — Y832 Surgical operation with anastomosis, bypass or graft as the cause of abnormal reaction of the patient, or of later complication, without mention of misadventure at the time of the procedure: Secondary | ICD-10-CM | POA: Diagnosis present

## 2023-08-25 DIAGNOSIS — Z833 Family history of diabetes mellitus: Secondary | ICD-10-CM

## 2023-08-25 DIAGNOSIS — C799 Secondary malignant neoplasm of unspecified site: Secondary | ICD-10-CM | POA: Diagnosis present

## 2023-08-25 DIAGNOSIS — Z803 Family history of malignant neoplasm of breast: Secondary | ICD-10-CM

## 2023-08-25 DIAGNOSIS — E1151 Type 2 diabetes mellitus with diabetic peripheral angiopathy without gangrene: Secondary | ICD-10-CM | POA: Diagnosis present

## 2023-08-25 DIAGNOSIS — Z85850 Personal history of malignant neoplasm of thyroid: Secondary | ICD-10-CM

## 2023-08-25 DIAGNOSIS — Z800 Family history of malignant neoplasm of digestive organs: Secondary | ICD-10-CM

## 2023-08-25 DIAGNOSIS — Z923 Personal history of irradiation: Secondary | ICD-10-CM

## 2023-08-25 DIAGNOSIS — Z87891 Personal history of nicotine dependence: Secondary | ICD-10-CM

## 2023-08-25 DIAGNOSIS — I7122 Aneurysm of the aortic arch, without rupture: Secondary | ICD-10-CM

## 2023-08-25 DIAGNOSIS — I71012 Dissection of descending thoracic aorta: Secondary | ICD-10-CM | POA: Diagnosis present

## 2023-08-25 DIAGNOSIS — B20 Human immunodeficiency virus [HIV] disease: Secondary | ICD-10-CM | POA: Diagnosis present

## 2023-08-25 DIAGNOSIS — N186 End stage renal disease: Secondary | ICD-10-CM | POA: Diagnosis present

## 2023-08-25 DIAGNOSIS — Z7901 Long term (current) use of anticoagulants: Secondary | ICD-10-CM

## 2023-08-25 DIAGNOSIS — M549 Dorsalgia, unspecified: Secondary | ICD-10-CM | POA: Diagnosis not present

## 2023-08-25 DIAGNOSIS — E1122 Type 2 diabetes mellitus with diabetic chronic kidney disease: Secondary | ICD-10-CM | POA: Diagnosis present

## 2023-08-25 DIAGNOSIS — Z7951 Long term (current) use of inhaled steroids: Secondary | ICD-10-CM

## 2023-08-25 DIAGNOSIS — Z8701 Personal history of pneumonia (recurrent): Secondary | ICD-10-CM

## 2023-08-25 DIAGNOSIS — K219 Gastro-esophageal reflux disease without esophagitis: Secondary | ICD-10-CM | POA: Diagnosis present

## 2023-08-25 DIAGNOSIS — I7103 Dissection of thoracoabdominal aorta: Secondary | ICD-10-CM

## 2023-08-25 DIAGNOSIS — J45909 Unspecified asthma, uncomplicated: Secondary | ICD-10-CM | POA: Diagnosis present

## 2023-08-25 DIAGNOSIS — Z888 Allergy status to other drugs, medicaments and biological substances status: Secondary | ICD-10-CM

## 2023-08-25 DIAGNOSIS — M25571 Pain in right ankle and joints of right foot: Secondary | ICD-10-CM | POA: Diagnosis not present

## 2023-08-25 DIAGNOSIS — I82629 Acute embolism and thrombosis of deep veins of unspecified upper extremity: Secondary | ICD-10-CM

## 2023-08-25 DIAGNOSIS — M7989 Other specified soft tissue disorders: Secondary | ICD-10-CM | POA: Diagnosis not present

## 2023-08-25 HISTORY — PX: THORACIC AORTIC ENDOVASCULAR STENT GRAFT: SHX6112

## 2023-08-25 HISTORY — PX: TEE WITHOUT CARDIOVERSION: SHX5443

## 2023-08-25 HISTORY — PX: ULTRASOUND GUIDANCE FOR VASCULAR ACCESS: SHX6516

## 2023-08-25 HISTORY — PX: AORTIC ARCH DEBRANCHING: SHX6787

## 2023-08-25 LAB — GLUCOSE, CAPILLARY
Glucose-Capillary: 98 mg/dL (ref 70–99)
Glucose-Capillary: 126 mg/dL — ABNORMAL HIGH (ref 70–99)
Glucose-Capillary: 184 mg/dL — ABNORMAL HIGH (ref 70–99)
Glucose-Capillary: 141 mg/dL — ABNORMAL HIGH (ref 70–99)
Glucose-Capillary: 146 mg/dL — ABNORMAL HIGH (ref 70–99)
Glucose-Capillary: 122 mg/dL — ABNORMAL HIGH (ref 70–99)
Glucose-Capillary: 143 mg/dL — ABNORMAL HIGH (ref 70–99)
Glucose-Capillary: 85 mg/dL (ref 70–99)
Glucose-Capillary: 131 mg/dL — ABNORMAL HIGH (ref 70–99)
Glucose-Capillary: 166 mg/dL — ABNORMAL HIGH (ref 70–99)

## 2023-08-25 LAB — POCT I-STAT 7, (LYTES, BLD GAS, ICA,H+H)
Acid-Base Excess: 4 mmol/L — ABNORMAL HIGH (ref 0.0–2.0)
Acid-Base Excess: 1 mmol/L (ref 0.0–2.0)
Acid-base deficit: 2 mmol/L (ref 0.0–2.0)
Bicarbonate: 29.8 mmol/L — ABNORMAL HIGH (ref 20.0–28.0)
Bicarbonate: 25.6 mmol/L (ref 20.0–28.0)
Bicarbonate: 23.7 mmol/L (ref 20.0–28.0)
Calcium, Ion: 1.19 mmol/L (ref 1.15–1.40)
Calcium, Ion: 1.2 mmol/L (ref 1.15–1.40)
Calcium, Ion: 1.18 mmol/L (ref 1.15–1.40)
HCT: 32 % — ABNORMAL LOW (ref 36.0–46.0)
HCT: 33 % — ABNORMAL LOW (ref 36.0–46.0)
HCT: 25 % — ABNORMAL LOW (ref 36.0–46.0)
Hemoglobin: 10.9 g/dL — ABNORMAL LOW (ref 12.0–15.0)
Hemoglobin: 11.2 g/dL — ABNORMAL LOW (ref 12.0–15.0)
Hemoglobin: 8.5 g/dL — ABNORMAL LOW (ref 12.0–15.0)
O2 Saturation: 100 %
O2 Saturation: 100 %
O2 Saturation: 100 %
Patient temperature: 35.9
Potassium: 4.2 mmol/L (ref 3.5–5.1)
Potassium: 4.2 mmol/L (ref 3.5–5.1)
Potassium: 4.7 mmol/L (ref 3.5–5.1)
Sodium: 140 mmol/L (ref 135–145)
Sodium: 139 mmol/L (ref 135–145)
Sodium: 137 mmol/L (ref 135–145)
TCO2: 31 mmol/L (ref 22–32)
TCO2: 27 mmol/L (ref 22–32)
TCO2: 25 mmol/L (ref 22–32)
pCO2 arterial: 50.9 mm[Hg] — ABNORMAL HIGH (ref 32–48)
pCO2 arterial: 39.9 mm[Hg] (ref 32–48)
pCO2 arterial: 41.4 mm[Hg] (ref 32–48)
pH, Arterial: 7.376 (ref 7.35–7.45)
pH, Arterial: 7.41 (ref 7.35–7.45)
pH, Arterial: 7.366 (ref 7.35–7.45)
pO2, Arterial: 209 mm[Hg] — ABNORMAL HIGH (ref 83–108)
pO2, Arterial: 224 mm[Hg] — ABNORMAL HIGH (ref 83–108)
pO2, Arterial: 243 mm[Hg] — ABNORMAL HIGH (ref 83–108)

## 2023-08-25 LAB — POCT I-STAT, CHEM 8
BUN: 31 mg/dL — ABNORMAL HIGH (ref 6–20)
Calcium, Ion: 1.12 mmol/L — ABNORMAL LOW (ref 1.15–1.40)
Chloride: 101 mmol/L (ref 98–111)
Creatinine, Ser: 4.4 mg/dL — ABNORMAL HIGH (ref 0.44–1.00)
Glucose, Bld: 107 mg/dL — ABNORMAL HIGH (ref 70–99)
HCT: 37 % (ref 36.0–46.0)
Hemoglobin: 12.6 g/dL (ref 12.0–15.0)
Potassium: 3.8 mmol/L (ref 3.5–5.1)
Sodium: 140 mmol/L (ref 135–145)
TCO2: 28 mmol/L (ref 22–32)

## 2023-08-25 LAB — CBC
HCT: 28 % — ABNORMAL LOW (ref 36.0–46.0)
Hemoglobin: 8.6 g/dL — ABNORMAL LOW (ref 12.0–15.0)
MCH: 29.4 pg (ref 26.0–34.0)
MCHC: 30.7 g/dL (ref 30.0–36.0)
MCV: 95.6 fL (ref 80.0–100.0)
Platelets: 179 10*3/uL (ref 150–400)
RBC: 2.93 MIL/uL — ABNORMAL LOW (ref 3.87–5.11)
RDW: 17.2 % — ABNORMAL HIGH (ref 11.5–15.5)
WBC: 15.4 10*3/uL — ABNORMAL HIGH (ref 4.0–10.5)
nRBC: 0 % (ref 0.0–0.2)

## 2023-08-25 LAB — POCT ACTIVATED CLOTTING TIME
Activated Clotting Time: 239 s
Activated Clotting Time: 245 s
Activated Clotting Time: 256 s
Activated Clotting Time: 135 s
Activated Clotting Time: 256 s

## 2023-08-25 LAB — APTT
aPTT: 31 s (ref 24–36)
aPTT: 28 s (ref 24–36)

## 2023-08-25 LAB — PROTIME-INR
INR: 1.2 (ref 0.8–1.2)
INR: 1.1 (ref 0.8–1.2)
Prothrombin Time: 15 s (ref 11.4–15.2)
Prothrombin Time: 14.8 s (ref 11.4–15.2)

## 2023-08-25 LAB — FIBRINOGEN
Fibrinogen: 446 mg/dL (ref 210–475)
Fibrinogen: 437 mg/dL (ref 210–475)

## 2023-08-25 LAB — PREPARE RBC (CROSSMATCH)

## 2023-08-25 SURGERY — DEBRANCHING, AORTIC ARCH
Anesthesia: General | Site: Groin | Laterality: Right

## 2023-08-25 MED ORDER — PROPOFOL 500 MG/50ML IV EMUL
INTRAVENOUS | Status: DC | PRN
  Administered 2023-08-25: 50 ug/kg/min via INTRAVENOUS

## 2023-08-25 MED ORDER — SUCCINYLCHOLINE CHLORIDE 200 MG/10ML IV SOSY
PREFILLED_SYRINGE | INTRAVENOUS | Status: DC | PRN
  Administered 2023-08-25: 200 mg via INTRAVENOUS

## 2023-08-25 MED ORDER — DOLUTEGRAVIR SODIUM 50 MG PO TABS
50.0000 mg | ORAL_TABLET | Freq: Every day | ORAL | Status: DC
  Administered 2023-08-26 – 2023-09-03 (×9): 50 mg via ORAL
  Filled 2023-08-25 (×9): qty 1

## 2023-08-25 MED ORDER — SODIUM CHLORIDE 0.9% FLUSH
3.0000 mL | INTRAVENOUS | Status: DC | PRN
Start: 2023-08-26 — End: 2023-09-01

## 2023-08-25 MED ORDER — PANTOPRAZOLE SODIUM 40 MG PO TBEC
40.0000 mg | DELAYED_RELEASE_TABLET | Freq: Every day | ORAL | Status: DC
  Administered 2023-08-27 – 2023-09-03 (×8): 40 mg via ORAL
  Filled 2023-08-25 (×8): qty 1

## 2023-08-25 MED ORDER — SODIUM CHLORIDE 0.9 % IV SOLN: INTRAVENOUS | Status: AC

## 2023-08-25 MED ORDER — INSULIN REGULAR(HUMAN) IN NACL 100-0.9 UT/100ML-% IV SOLN: INTRAVENOUS | Status: DC

## 2023-08-25 MED ORDER — FERRIC CITRATE 1 GM 210 MG(FE) PO TABS
210.0000 mg | ORAL_TABLET | Freq: Three times a day (TID) | ORAL | Status: DC
  Administered 2023-08-27 – 2023-09-03 (×18): 210 mg via ORAL
  Filled 2023-08-25 (×26): qty 1

## 2023-08-25 MED ORDER — MORPHINE SULFATE (PF) 2 MG/ML IV SOLN
1.0000 mg | INTRAVENOUS | Status: DC | PRN
  Administered 2023-08-25: 1 mg via INTRAVENOUS
  Administered 2023-08-25: 2 mg via INTRAVENOUS
  Administered 2023-08-25 – 2023-08-26 (×6): 4 mg via INTRAVENOUS
  Administered 2023-08-26: 2 mg via INTRAVENOUS
  Administered 2023-08-26 – 2023-08-27 (×2): 4 mg via INTRAVENOUS
  Administered 2023-08-28 – 2023-08-30 (×3): 2 mg via INTRAVENOUS
  Filled 2023-08-25: qty 1
  Filled 2023-08-25 (×4): qty 2
  Filled 2023-08-25 (×3): qty 1
  Filled 2023-08-25: qty 2
  Filled 2023-08-25: qty 1
  Filled 2023-08-25: qty 2
  Filled 2023-08-25: qty 1
  Filled 2023-08-25 (×2): qty 2

## 2023-08-25 MED ORDER — HEPARIN 6000 UNIT IRRIGATION SOLUTION
Status: AC
  Filled 2023-08-25: qty 500

## 2023-08-25 MED ORDER — PROPOFOL 10 MG/ML IV BOLUS
INTRAVENOUS | Status: AC
  Filled 2023-08-25: qty 20

## 2023-08-25 MED ORDER — CHLORHEXIDINE GLUCONATE 0.12 % MT SOLN: 15.0000 mL | Freq: Once | OROMUCOSAL | Status: AC

## 2023-08-25 MED ORDER — ASPIRIN 81 MG PO CHEW
324.0000 mg | CHEWABLE_TABLET | Freq: Every day | ORAL | Status: DC
  Administered 2023-08-26: 324 mg
  Filled 2023-08-25 (×2): qty 4

## 2023-08-25 MED ORDER — MIDAZOLAM HCL 2 MG/2ML IJ SOLN
INTRAMUSCULAR | Status: AC
  Filled 2023-08-25: qty 2

## 2023-08-25 MED ORDER — NITROGLYCERIN IN D5W 200-5 MCG/ML-% IV SOLN
0.0000 ug/min | INTRAVENOUS | Status: DC
  Administered 2023-08-25 (×2): 5 ug/min via INTRAVENOUS
  Filled 2023-08-25: qty 250

## 2023-08-25 MED ORDER — SODIUM CHLORIDE (PF) 0.9 % IJ SOLN
INTRAMUSCULAR | Status: AC
  Filled 2023-08-25: qty 10

## 2023-08-25 MED ORDER — FENTANYL CITRATE (PF) 250 MCG/5ML IJ SOLN
INTRAMUSCULAR | Status: DC | PRN
  Administered 2023-08-25: 25 ug via INTRAVENOUS
  Administered 2023-08-25: 50 ug via INTRAVENOUS
  Administered 2023-08-25: 25 ug via INTRAVENOUS
  Administered 2023-08-25: 100 ug via INTRAVENOUS
  Administered 2023-08-25: 50 ug via INTRAVENOUS
  Administered 2023-08-25: 100 ug via INTRAVENOUS

## 2023-08-25 MED ORDER — HEPARIN 6000 UNIT IRRIGATION SOLUTION
Status: DC | PRN
  Administered 2023-08-25: 1

## 2023-08-25 MED ORDER — METOCLOPRAMIDE HCL 5 MG/ML IJ SOLN
10.0000 mg | Freq: Four times a day (QID) | INTRAMUSCULAR | Status: AC
  Administered 2023-08-25 – 2023-08-27 (×6): 10 mg via INTRAVENOUS
  Filled 2023-08-25 (×6): qty 2

## 2023-08-25 MED ORDER — LIDOCAINE HCL (CARDIAC) PF 100 MG/5ML IV SOSY
PREFILLED_SYRINGE | INTRAVENOUS | Status: DC | PRN
  Administered 2023-08-25: 100 mg via INTRAVENOUS

## 2023-08-25 MED ORDER — DEXAMETHASONE SODIUM PHOSPHATE 10 MG/ML IJ SOLN
INTRAMUSCULAR | Status: DC | PRN
  Administered 2023-08-25: 10 mg via INTRAVENOUS

## 2023-08-25 MED ORDER — ARTIFICIAL TEARS OPHTHALMIC OINT
TOPICAL_OINTMENT | OPHTHALMIC | Status: AC
  Filled 2023-08-25: qty 3.5

## 2023-08-25 MED ORDER — NOREPINEPHRINE 4 MG/250ML-% IV SOLN
2.0000 ug/min | INTRAVENOUS | Status: DC
  Administered 2023-08-27: 7 ug/min via INTRAVENOUS
  Filled 2023-08-25: qty 250

## 2023-08-25 MED ORDER — DEXAMETHASONE SODIUM PHOSPHATE 10 MG/ML IJ SOLN
INTRAMUSCULAR | Status: AC
  Filled 2023-08-25: qty 1

## 2023-08-25 MED ORDER — PROPOFOL 1000 MG/100ML IV EMUL
INTRAVENOUS | Status: AC
  Filled 2023-08-25: qty 100

## 2023-08-25 MED ORDER — CHLORHEXIDINE GLUCONATE CLOTH 2 % EX PADS
6.0000 | MEDICATED_PAD | Freq: Every day | CUTANEOUS | Status: DC
  Administered 2023-08-25 – 2023-09-01 (×9): 6 via TOPICAL

## 2023-08-25 MED ORDER — PLASMA-LYTE A IV SOLN
INTRAVENOUS | Status: DC | PRN
  Administered 2023-08-25: 500 mL

## 2023-08-25 MED ORDER — KETAMINE HCL 50 MG/5ML IJ SOSY
PREFILLED_SYRINGE | INTRAMUSCULAR | Status: AC
Start: 2023-08-25 — End: ?
  Filled 2023-08-25: qty 5

## 2023-08-25 MED ORDER — THROMBIN (RECOMBINANT) 20000 UNITS EX SOLR
CUTANEOUS | Status: AC
  Filled 2023-08-25: qty 20000

## 2023-08-25 MED ORDER — METOPROLOL TARTRATE 12.5 MG HALF TABLET: 12.5000 mg | ORAL_TABLET | Freq: Once | ORAL | Status: DC

## 2023-08-25 MED ORDER — CHLORHEXIDINE GLUCONATE 4 % EX SOLN: 30.0000 mL | CUTANEOUS | Status: DC

## 2023-08-25 MED ORDER — DEXMEDETOMIDINE HCL IN NACL 400 MCG/100ML IV SOLN
0.0000 ug/kg/h | INTRAVENOUS | Status: DC
Start: 2023-08-25 — End: 2023-08-27
  Administered 2023-08-25 – 2023-08-26 (×3): 1 ug/kg/h via INTRAVENOUS
  Filled 2023-08-25 (×3): qty 100

## 2023-08-25 MED ORDER — BISACODYL 10 MG RE SUPP: 10.0000 mg | Freq: Every day | RECTAL | Status: DC

## 2023-08-25 MED ORDER — OXYCODONE HCL 5 MG PO TABS
5.0000 mg | ORAL_TABLET | ORAL | Status: DC | PRN
  Administered 2023-08-26 – 2023-08-29 (×5): 5 mg via ORAL
  Filled 2023-08-25 (×5): qty 1

## 2023-08-25 MED ORDER — PHENYLEPHRINE 80 MCG/ML (10ML) SYRINGE FOR IV PUSH (FOR BLOOD PRESSURE SUPPORT)
PREFILLED_SYRINGE | INTRAVENOUS | Status: AC
  Filled 2023-08-25: qty 10

## 2023-08-25 MED ORDER — DOCUSATE SODIUM 100 MG PO CAPS
200.0000 mg | ORAL_CAPSULE | Freq: Every day | ORAL | Status: DC
Start: 2023-08-26 — End: 2023-09-03
  Administered 2023-08-27 – 2023-09-03 (×6): 200 mg via ORAL
  Filled 2023-08-25 (×7): qty 2

## 2023-08-25 MED ORDER — DEXTROSE 50 % IV SOLN: 0.0000 mL | INTRAVENOUS | Status: DC | PRN

## 2023-08-25 MED ORDER — SUCCINYLCHOLINE CHLORIDE 200 MG/10ML IV SOSY
PREFILLED_SYRINGE | INTRAVENOUS | Status: AC
  Filled 2023-08-25: qty 10

## 2023-08-25 MED ORDER — ACETAMINOPHEN 160 MG/5ML PO SOLN
650.0000 mg | Freq: Once | ORAL | Status: AC
  Administered 2023-08-25: 650 mg
  Filled 2023-08-25: qty 20.3

## 2023-08-25 MED ORDER — PANTOPRAZOLE SODIUM 40 MG IV SOLR
40.0000 mg | Freq: Every day | INTRAVENOUS | Status: AC
  Administered 2023-08-25 – 2023-08-26 (×2): 40 mg via INTRAVENOUS
  Filled 2023-08-25 (×2): qty 10

## 2023-08-25 MED ORDER — SODIUM CHLORIDE 0.9 % IV SOLN: INTRAVENOUS | Status: DC | PRN

## 2023-08-25 MED ORDER — SUGAMMADEX SODIUM 200 MG/2ML IV SOLN
INTRAVENOUS | Status: DC | PRN
  Administered 2023-08-25: 200 mg via INTRAVENOUS

## 2023-08-25 MED ORDER — CEFAZOLIN SODIUM-DEXTROSE 1-4 GM/50ML-% IV SOLN: 1.0000 g | Freq: Three times a day (TID) | INTRAVENOUS | Status: DC

## 2023-08-25 MED ORDER — PHENYLEPHRINE 80 MCG/ML (10ML) SYRINGE FOR IV PUSH (FOR BLOOD PRESSURE SUPPORT)
PREFILLED_SYRINGE | INTRAVENOUS | Status: DC | PRN
  Administered 2023-08-25: 120 ug via INTRAVENOUS
  Administered 2023-08-25 (×3): 160 ug via INTRAVENOUS
  Administered 2023-08-25: 80 ug via INTRAVENOUS
  Administered 2023-08-25: 240 ug via INTRAVENOUS
  Administered 2023-08-25: 80 ug via INTRAVENOUS

## 2023-08-25 MED ORDER — THROMBIN 20000 UNITS EX SOLR: OROMUCOSAL | Status: DC | PRN

## 2023-08-25 MED ORDER — ORAL CARE MOUTH RINSE: 15.0000 mL | OROMUCOSAL | Status: DC | PRN

## 2023-08-25 MED ORDER — METOPROLOL TARTRATE 25 MG/10 ML ORAL SUSPENSION
12.5000 mg | Freq: Two times a day (BID) | ORAL | Status: DC
  Filled 2023-08-25 (×2): qty 5

## 2023-08-25 MED ORDER — PROPOFOL 1000 MG/100ML IV EMUL
5.0000 ug/kg/min | INTRAVENOUS | Status: DC
  Administered 2023-08-25: 20 ug/kg/min via INTRAVENOUS
  Administered 2023-08-25 – 2023-08-26 (×2): 50 ug/kg/min via INTRAVENOUS
  Filled 2023-08-25 (×3): qty 100

## 2023-08-25 MED ORDER — ARTIFICIAL TEARS OPHTHALMIC OINT
TOPICAL_OINTMENT | OPHTHALMIC | Status: DC | PRN
  Administered 2023-08-25: 1 via OPHTHALMIC

## 2023-08-25 MED ORDER — ALLOPURINOL 100 MG PO TABS
100.0000 mg | ORAL_TABLET | Freq: Every day | ORAL | Status: DC
  Administered 2023-08-26 – 2023-09-03 (×9): 100 mg via ORAL
  Filled 2023-08-25 (×9): qty 1

## 2023-08-25 MED ORDER — ROCURONIUM BROMIDE 10 MG/ML (PF) SYRINGE
PREFILLED_SYRINGE | INTRAVENOUS | Status: AC
  Filled 2023-08-25: qty 10

## 2023-08-25 MED ORDER — CHLORHEXIDINE GLUCONATE 0.12 % MT SOLN
15.0000 mL | OROMUCOSAL | Status: AC
  Administered 2023-08-25: 15 mL via OROMUCOSAL
  Filled 2023-08-25: qty 15

## 2023-08-25 MED ORDER — 0.9 % SODIUM CHLORIDE (POUR BTL) OPTIME
TOPICAL | Status: DC | PRN
  Administered 2023-08-25: 4000 mL

## 2023-08-25 MED ORDER — ASPIRIN 81 MG PO CHEW
324.0000 mg | CHEWABLE_TABLET | Freq: Once | ORAL | Status: AC
  Administered 2023-08-25: 324 mg via ORAL

## 2023-08-25 MED ORDER — HEMOSTATIC AGENTS (NO CHARGE) OPTIME
TOPICAL | Status: DC | PRN
  Administered 2023-08-25: 1 via TOPICAL
  Administered 2023-08-25: 4 via TOPICAL

## 2023-08-25 MED ORDER — ALBUMIN HUMAN 5 % IV SOLN: INTRAVENOUS | Status: DC | PRN

## 2023-08-25 MED ORDER — HEPARIN SODIUM (PORCINE) 5000 UNIT/ML IJ SOLN: 5000.0000 [IU] | Freq: Two times a day (BID) | INTRAMUSCULAR | Status: DC

## 2023-08-25 MED ORDER — HEPARIN SODIUM (PORCINE) 1000 UNIT/ML IJ SOLN
INTRAMUSCULAR | Status: AC
  Filled 2023-08-25: qty 10

## 2023-08-25 MED ORDER — LIDOCAINE 2% (20 MG/ML) 5 ML SYRINGE
INTRAMUSCULAR | Status: AC
  Filled 2023-08-25: qty 5

## 2023-08-25 MED ORDER — FENTANYL CITRATE (PF) 250 MCG/5ML IJ SOLN
INTRAMUSCULAR | Status: AC
  Filled 2023-08-25: qty 5

## 2023-08-25 MED ORDER — METOPROLOL TARTRATE 5 MG/5ML IV SOLN
2.5000 mg | INTRAVENOUS | Status: DC | PRN
  Filled 2023-08-25 (×2): qty 5

## 2023-08-25 MED ORDER — VECURONIUM BROMIDE 10 MG IV SOLR
INTRAVENOUS | Status: AC
Start: 2023-08-25 — End: ?
  Filled 2023-08-25: qty 10

## 2023-08-25 MED ORDER — METOPROLOL TARTRATE 12.5 MG HALF TABLET
12.5000 mg | ORAL_TABLET | Freq: Two times a day (BID) | ORAL | Status: DC
  Administered 2023-08-28 – 2023-08-30 (×6): 12.5 mg via ORAL
  Filled 2023-08-25 (×9): qty 1

## 2023-08-25 MED ORDER — BISACODYL 5 MG PO TBEC
10.0000 mg | DELAYED_RELEASE_TABLET | Freq: Every day | ORAL | Status: DC
  Administered 2023-08-26 – 2023-09-01 (×5): 10 mg via ORAL
  Filled 2023-08-25 (×6): qty 2

## 2023-08-25 MED ORDER — CHLORHEXIDINE GLUCONATE 0.12 % MT SOLN: 15.0000 mL | Freq: Once | OROMUCOSAL | Status: DC

## 2023-08-25 MED ORDER — ACETAMINOPHEN 160 MG/5ML PO SOLN
1000.0000 mg | Freq: Four times a day (QID) | ORAL | Status: AC
  Administered 2023-08-26 (×3): 1000 mg
  Filled 2023-08-25 (×4): qty 40.6

## 2023-08-25 MED ORDER — PROTAMINE SULFATE 10 MG/ML IV SOLN
INTRAVENOUS | Status: DC | PRN
  Administered 2023-08-25: 50 mg via INTRAVENOUS

## 2023-08-25 MED ORDER — SODIUM CHLORIDE 0.9 % IV SOLN: INTRAVENOUS | Status: DC

## 2023-08-25 MED ORDER — SODIUM CHLORIDE 0.9 % IV SOLN
20.0000 ug | Freq: Once | INTRAVENOUS | Status: AC
  Administered 2023-08-25: 20 ug via INTRAVENOUS
  Filled 2023-08-25: qty 5

## 2023-08-25 MED ORDER — ACETAMINOPHEN 500 MG PO TABS
1000.0000 mg | ORAL_TABLET | Freq: Four times a day (QID) | ORAL | Status: AC
  Administered 2023-08-26 – 2023-08-30 (×16): 1000 mg via ORAL
  Filled 2023-08-25 (×17): qty 2

## 2023-08-25 MED ORDER — ALBUMIN HUMAN 5 % IV SOLN: 250.0000 mL | INTRAVENOUS | Status: DC | PRN

## 2023-08-25 MED ORDER — CHLORHEXIDINE GLUCONATE CLOTH 2 % EX PADS: 6.0000 | MEDICATED_PAD | Freq: Once | CUTANEOUS | Status: DC

## 2023-08-25 MED ORDER — CALCIUM CHLORIDE 10 % IV SOLN
INTRAVENOUS | Status: AC
  Filled 2023-08-25: qty 10

## 2023-08-25 MED ORDER — LACTATED RINGERS IV SOLN: INTRAVENOUS | Status: DC | PRN

## 2023-08-25 MED ORDER — PROTAMINE SULFATE 10 MG/ML IV SOLN
INTRAVENOUS | Status: AC
  Filled 2023-08-25: qty 5

## 2023-08-25 MED ORDER — CHLORHEXIDINE GLUCONATE 0.12 % MT SOLN
OROMUCOSAL | Status: AC
  Administered 2023-08-25: 15 mL via OROMUCOSAL
  Filled 2023-08-25: qty 15

## 2023-08-25 MED ORDER — VECURONIUM BROMIDE 10 MG IV SOLR
INTRAVENOUS | Status: DC | PRN
  Administered 2023-08-25: 10 mg via INTRAVENOUS
  Administered 2023-08-25: 4 mg via INTRAVENOUS

## 2023-08-25 MED ORDER — VANCOMYCIN HCL IN DEXTROSE 1-5 GM/200ML-% IV SOLN
1000.0000 mg | Freq: Once | INTRAVENOUS | Status: AC
  Administered 2023-08-25: 1000 mg via INTRAVENOUS
  Filled 2023-08-25: qty 200

## 2023-08-25 MED ORDER — SODIUM CHLORIDE 0.9 % IV SOLN: 250.0000 mL | INTRAVENOUS | Status: AC

## 2023-08-25 MED ORDER — EMTRICITABINE-TENOFOVIR AF 200-25 MG PO TABS
1.0000 | ORAL_TABLET | Freq: Every day | ORAL | Status: DC
  Administered 2023-08-26 – 2023-09-03 (×9): 1 via ORAL
  Filled 2023-08-25 (×9): qty 1

## 2023-08-25 MED ORDER — SODIUM CHLORIDE 0.9% FLUSH
3.0000 mL | Freq: Two times a day (BID) | INTRAVENOUS | Status: DC
  Administered 2023-08-26 – 2023-09-01 (×13): 3 mL via INTRAVENOUS

## 2023-08-25 MED ORDER — ONDANSETRON HCL 4 MG/2ML IJ SOLN
4.0000 mg | Freq: Four times a day (QID) | INTRAMUSCULAR | Status: DC | PRN
  Administered 2023-08-28 – 2023-08-30 (×4): 4 mg via INTRAVENOUS
  Filled 2023-08-25 (×7): qty 2

## 2023-08-25 MED ORDER — CLEVIDIPINE BUTYRATE 0.5 MG/ML IV EMUL
INTRAVENOUS | Status: AC
Start: 2023-08-25 — End: ?
  Filled 2023-08-25: qty 50

## 2023-08-25 MED ORDER — ORAL CARE MOUTH RINSE: 15.0000 mL | Freq: Once | OROMUCOSAL | Status: AC

## 2023-08-25 MED ORDER — KETAMINE HCL 10 MG/ML IJ SOLN
INTRAMUSCULAR | Status: DC | PRN
  Administered 2023-08-25: 50 mg via INTRAVENOUS

## 2023-08-25 MED ORDER — METOPROLOL TARTRATE 12.5 MG HALF TABLET
ORAL_TABLET | ORAL | Status: AC
  Filled 2023-08-25: qty 1

## 2023-08-25 MED ORDER — BUDESONIDE 0.25 MG/2ML IN SUSP
0.2500 mg | Freq: Two times a day (BID) | RESPIRATORY_TRACT | Status: DC
  Administered 2023-08-25 – 2023-09-03 (×15): 0.25 mg via RESPIRATORY_TRACT
  Filled 2023-08-25 (×17): qty 2

## 2023-08-25 MED ORDER — IODIXANOL 320 MG/ML IV SOLN
INTRAVENOUS | Status: DC | PRN
  Administered 2023-08-25: 70 mL

## 2023-08-25 MED ORDER — ROSUVASTATIN CALCIUM 5 MG PO TABS
10.0000 mg | ORAL_TABLET | Freq: Every day | ORAL | Status: DC
  Administered 2023-08-26 – 2023-09-03 (×9): 10 mg via ORAL
  Filled 2023-08-25 (×9): qty 2

## 2023-08-25 MED ORDER — ASPIRIN 325 MG PO TBEC
325.0000 mg | DELAYED_RELEASE_TABLET | Freq: Every day | ORAL | Status: DC
  Administered 2023-08-27 – 2023-08-29 (×3): 325 mg via ORAL
  Filled 2023-08-25 (×4): qty 1

## 2023-08-25 MED ORDER — MIDAZOLAM HCL 2 MG/2ML IJ SOLN: 2.0000 mg | INTRAMUSCULAR | Status: DC | PRN

## 2023-08-25 MED ORDER — TRAMADOL HCL 50 MG PO TABS
50.0000 mg | ORAL_TABLET | Freq: Four times a day (QID) | ORAL | Status: DC | PRN
  Administered 2023-08-26 – 2023-09-03 (×18): 50 mg via ORAL
  Filled 2023-08-25 (×18): qty 1

## 2023-08-25 MED ORDER — CEFAZOLIN SODIUM-DEXTROSE 2-4 GM/100ML-% IV SOLN
2.0000 g | INTRAVENOUS | Status: AC
Start: 2023-08-25 — End: 2023-08-25
  Administered 2023-08-25 (×2): 2 g via INTRAVENOUS

## 2023-08-25 MED ORDER — HEPARIN SODIUM (PORCINE) 5000 UNIT/ML IJ SOLN
5000.0000 [IU] | Freq: Two times a day (BID) | INTRAMUSCULAR | Status: DC
  Administered 2023-08-26 (×2): 5000 [IU] via SUBCUTANEOUS
  Filled 2023-08-25 (×3): qty 1

## 2023-08-25 MED ORDER — ~~LOC~~ CARDIAC SURGERY, PATIENT & FAMILY EDUCATION
Freq: Once | Status: DC
  Filled 2023-08-25: qty 1

## 2023-08-25 MED ORDER — ORAL CARE MOUTH RINSE
15.0000 mL | OROMUCOSAL | Status: DC
  Administered 2023-08-25 – 2023-08-26 (×9): 15 mL via OROMUCOSAL

## 2023-08-25 MED ORDER — SODIUM CHLORIDE 0.9% IV SOLUTION
Freq: Once | INTRAVENOUS | Status: AC
Start: 2023-08-25 — End: 2023-08-25

## 2023-08-25 MED ORDER — LEVOTHYROXINE SODIUM 75 MCG PO TABS
175.0000 ug | ORAL_TABLET | Freq: Every day | ORAL | Status: DC
  Administered 2023-08-26 – 2023-09-03 (×9): 175 ug via ORAL
  Filled 2023-08-25 (×10): qty 1

## 2023-08-25 MED ORDER — PHENYLEPHRINE HCL-NACL 20-0.9 MG/250ML-% IV SOLN
0.0000 ug/min | INTRAVENOUS | Status: DC
  Filled 2023-08-25: qty 250

## 2023-08-25 MED ORDER — VECURONIUM BROMIDE 10 MG IV SOLR
INTRAVENOUS | Status: AC
  Filled 2023-08-25: qty 10

## 2023-08-25 MED ORDER — PROPOFOL 10 MG/ML IV BOLUS
INTRAVENOUS | Status: DC | PRN
  Administered 2023-08-25: 100 mg via INTRAVENOUS

## 2023-08-25 MED ORDER — CALCIUM CHLORIDE 10 % IV SOLN
INTRAVENOUS | Status: DC | PRN
  Administered 2023-08-25 (×2): 400 mg via INTRAVENOUS
  Administered 2023-08-25: 200 mg via INTRAVENOUS

## 2023-08-25 MED ORDER — HEPARIN SODIUM (PORCINE) 1000 UNIT/ML IJ SOLN
INTRAMUSCULAR | Status: DC | PRN
  Administered 2023-08-25: 3000 [IU] via INTRAVENOUS
  Administered 2023-08-25: 8000 [IU] via INTRAVENOUS
  Administered 2023-08-25 (×2): 2000 [IU] via INTRAVENOUS

## 2023-08-25 MED ORDER — SODIUM BICARBONATE 8.4 % IV SOLN
50.0000 meq | Freq: Once | INTRAVENOUS | Status: AC
  Administered 2023-08-25: 50 meq via INTRAVENOUS

## 2023-08-25 MED ORDER — MIDAZOLAM HCL (PF) 5 MG/ML IJ SOLN
INTRAMUSCULAR | Status: DC | PRN
  Administered 2023-08-25: .5 mg via INTRAVENOUS
  Administered 2023-08-25: 2 mg via INTRAVENOUS
  Administered 2023-08-25: .5 mg via INTRAVENOUS
  Administered 2023-08-25: 1 mg via INTRAVENOUS

## 2023-08-25 MED ORDER — METHADONE HCL IV SYRINGE 10 MG/ML FOR CABG
0.3000 mg/kg | Freq: Once | INTRAMUSCULAR | Status: DC
  Filled 2023-08-25: qty 1.7

## 2023-08-25 MED ORDER — CEFAZOLIN SODIUM-DEXTROSE 2-4 GM/100ML-% IV SOLN
2.0000 g | Freq: Three times a day (TID) | INTRAVENOUS | Status: DC
  Administered 2023-08-25 – 2023-08-26 (×4): 2 g via INTRAVENOUS
  Filled 2023-08-25 (×4): qty 100

## 2023-08-25 SURGICAL SUPPLY — 109 items
5 PAIRS OF YELLOW SUTURE CLAMP (MISCELLANEOUS) ×3
ADH SKN CLS APL DERMABOND .7 (GAUZE/BANDAGES/DRESSINGS) ×3
APPLIER CLIP 9.375 MED OPEN (MISCELLANEOUS) ×3
APPLIER CLIP 9.375 SM OPEN (CLIP) ×3
APR CLP MED 9.3 20 MLT OPN (MISCELLANEOUS) ×3
APR CLP SM 9.3 20 MLT OPN (CLIP) ×3
BAG COUNTER SPONGE SURGICOUNT (BAG) ×3 IMPLANT
BAG DECANTER FOR FLEXI CONT (MISCELLANEOUS) ×3 IMPLANT
BAG SPNG CNTER NS LX DISP (BAG) ×3
BLADE STERNUM SYSTEM 6 (BLADE) ×3 IMPLANT
CANISTER SUCT 3000ML PPV (MISCELLANEOUS) ×6 IMPLANT
CARDIAC VASCULAR WIRE ×3
CATH ACCU-VU SIZ PIG 5F 100CM (CATHETERS) IMPLANT
CATH BALLN TRILOBE 26-42 (BALLOONS) IMPLANT
CATH ROBINSON RED A/P 18FR (CATHETERS) ×6 IMPLANT
CLAMP SUTURE YELLOW 5 PAIRS (MISCELLANEOUS) IMPLANT
CLIP APPLIE 9.375 MED OPEN (MISCELLANEOUS) ×3 IMPLANT
CLIP APPLIE 9.375 SM OPEN (CLIP) ×3 IMPLANT
CLOSURE PERCLOSE PROSTYLE (VASCULAR PRODUCTS) IMPLANT
DERMABOND ADVANCED .7 DNX12 (GAUZE/BANDAGES/DRESSINGS) ×3 IMPLANT
DEVICE CLOSURE PERCLS PRGLD 6F (VASCULAR PRODUCTS) ×6 IMPLANT
DRAPE CV SPLIT W-CLR ANES SCRN (DRAPES) ×3 IMPLANT
DRAPE INCISE IOBAN 66X45 STRL (DRAPES) IMPLANT
DRAPE PERI GROIN 82X75IN TIB (DRAPES) ×3 IMPLANT
DRSG COVADERM 4X14 (GAUZE/BANDAGES/DRESSINGS) ×3 IMPLANT
DRSG TEGADERM 2-3/8X2-3/4 SM (GAUZE/BANDAGES/DRESSINGS) ×3 IMPLANT
ELECT REM PT RETURN 9FT ADLT (ELECTROSURGICAL) ×12
ELECTRODE REM PT RTRN 9FT ADLT (ELECTROSURGICAL) ×12 IMPLANT
FELT TEFLON 1X6 (MISCELLANEOUS) ×3 IMPLANT
GAUZE SPONGE 4X4 12PLY STRL (GAUZE/BANDAGES/DRESSINGS) ×6 IMPLANT
GAUZE SPONGE 4X4 12PLY STRL LF (GAUZE/BANDAGES/DRESSINGS) IMPLANT
GLOVE BIO SURGEON STRL SZ 6 (GLOVE) IMPLANT
GLOVE BIO SURGEON STRL SZ 6.5 (GLOVE) IMPLANT
GLOVE BIOGEL PI IND STRL 6.5 (GLOVE) IMPLANT
GLOVE BIOGEL PI IND STRL 8 (GLOVE) ×3 IMPLANT
GOWN STRL NON-REIN LRG LVL3 (GOWN DISPOSABLE) ×3 IMPLANT
GOWN STRL REUS W/ TWL LRG LVL3 (GOWN DISPOSABLE) ×21 IMPLANT
GOWN STRL REUS W/TWL 2XL LVL3 (GOWN DISPOSABLE) ×3 IMPLANT
GOWN STRL REUS W/TWL LRG LVL3 (GOWN DISPOSABLE) ×27
GRAFT ENDOVASC 36X113 18F (Endovascular Graft) IMPLANT
GRAFT ENDOVASC 46X179 20F (Endovascular Graft) IMPLANT
GRAFT ENDOVASC ZEN 40X117 20F (Endovascular Graft) IMPLANT
GRAFT HEMASHIELD 30X12 (Graft) IMPLANT
GUIDEWIRE AMPLATZ STIF 0.35 (WIRE) IMPLANT
GUIDEWIRE AMPLATZ STIFF 0.35 (WIRE) ×3
HEMOSTAT POWDER SURGIFOAM 1G (HEMOSTASIS) ×9 IMPLANT
HEMOSTAT SURGICEL 2X14 (HEMOSTASIS) ×3 IMPLANT
INSERT FOGARTY SM (MISCELLANEOUS) ×18 IMPLANT
INSERT FOGARTY XLG (MISCELLANEOUS) ×3 IMPLANT
KIT BASIN OR (CUSTOM PROCEDURE TRAY) ×6 IMPLANT
KIT SUCTION CATH 14FR (SUCTIONS) ×3 IMPLANT
KIT TURNOVER KIT B (KITS) ×6 IMPLANT
LOOP VASCLR EXTRA MAXI WHITE (MISCELLANEOUS) ×6 IMPLANT
LOOP VASCLR MAXI BLUE 18IN ST (MISCELLANEOUS) ×6 IMPLANT
LOOPS VASCLR EXTRA MAXI WHITE (MISCELLANEOUS) ×3 IMPLANT
LOOPS VASCLR MAXI BLUE 18IN ST (MISCELLANEOUS) ×6 IMPLANT
NS IRRIG 1000ML POUR BTL (IV SOLUTION) ×21 IMPLANT
PACK ENDO MINOR (CUSTOM PROCEDURE TRAY) IMPLANT
PACK ENDOVASCULAR (PACKS) ×3 IMPLANT
PACK OPEN HEART (CUSTOM PROCEDURE TRAY) ×3 IMPLANT
PAD ARMBOARD 7.5X6 YLW CONV (MISCELLANEOUS) ×6 IMPLANT
PENCIL BUTTON HOLSTER BLD 10FT (ELECTRODE) IMPLANT
PERCLOSE PROGLIDE 6F (VASCULAR PRODUCTS) ×6
POSITIONER HEAD DONUT 9IN (MISCELLANEOUS) ×3 IMPLANT
PUNCH AORTIC ROTATE 5MM 8IN (MISCELLANEOUS) IMPLANT
SEALANT SURG COSEAL 8ML (VASCULAR PRODUCTS) ×3 IMPLANT
SET MICROPUNCTURE 5F STIFF (MISCELLANEOUS) ×3 IMPLANT
SHEATH DRYSEAL FLEX 12FR 33CM (SHEATH) IMPLANT
SHEATH PINNACLE 8F 10CM (SHEATH) IMPLANT
SPONGE T-LAP 18X18 ~~LOC~~+RFID (SPONGE) IMPLANT
SPONGE T-LAP 4X18 ~~LOC~~+RFID (SPONGE) IMPLANT
STOPCOCK MORSE 400PSI 3WAY (MISCELLANEOUS) ×3 IMPLANT
SUT MNCRL AB 4-0 PS2 18 (SUTURE) ×3 IMPLANT
SUT PROLENE 3 0 SH DA (SUTURE) IMPLANT
SUT PROLENE 4 0 RB 1 (SUTURE) ×33
SUT PROLENE 4 0 SH DA (SUTURE) IMPLANT
SUT PROLENE 4-0 RB1 .5 CRCL 36 (SUTURE) ×6 IMPLANT
SUT PROLENE 5 0 C 1 36 (SUTURE) ×6 IMPLANT
SUT SILK 1 MH (SUTURE) ×9 IMPLANT
SUT SILK 1 TIES 10X30 (SUTURE) ×3 IMPLANT
SUT SILK 2 0 (SUTURE) ×3
SUT SILK 2 0 SH CR/8 (SUTURE) ×6 IMPLANT
SUT SILK 2-0 18XBRD TIE 12 (SUTURE) ×3 IMPLANT
SUT SILK 3 0 SH CR/8 (SUTURE) ×3 IMPLANT
SUT SILK 3 0 TIES 10X30 (SUTURE) IMPLANT
SUT SILK 4 0 (SUTURE) ×3
SUT SILK 4-0 18XBRD TIE 12 (SUTURE) ×3 IMPLANT
SUT STEEL 6MS V (SUTURE) IMPLANT
SUT TEM PAC WIRE 2 0 SH (SUTURE) ×12 IMPLANT
SUT VIC AB 1 CTX 36 (SUTURE) ×6
SUT VIC AB 1 CTX36XBRD ANBCTR (SUTURE) ×6 IMPLANT
SUT VIC AB 3-0 X1 27 (SUTURE) ×6 IMPLANT
SYR 50ML LL SCALE MARK (SYRINGE) IMPLANT
SYR MEDRAD MARK V 150ML (SYRINGE) IMPLANT
SYSTEM SAHARA CHEST DRAIN ATS (WOUND CARE) ×3 IMPLANT
TAG SUTURE CLAMP YLW 5PR (MISCELLANEOUS) ×3
TAPE CLOTH 4X10 WHT NS (GAUZE/BANDAGES/DRESSINGS) IMPLANT
TOWEL GREEN STERILE FF (TOWEL DISPOSABLE) ×3 IMPLANT
TRAY FOLEY SLVR 14FR TEMP STAT (SET/KITS/TRAYS/PACK) ×3 IMPLANT
TUBE CONNECTING 20X1/4 (TUBING) IMPLANT
TUBING HIGH PRESSURE 120CM (CONNECTOR) ×3 IMPLANT
TUBING INJECTOR 48 (MISCELLANEOUS) IMPLANT
VASCULAR TIE EXTRA MAXI WHITE (MISCELLANEOUS) ×3
VASCULAR TIE MAXI BLUE 18IN ST (MISCELLANEOUS) ×6
VASCULAR TIE MINI RED 18IN STL (MISCELLANEOUS) ×6 IMPLANT
WATER STERILE IRR 1000ML POUR (IV SOLUTION) ×6 IMPLANT
WIRE BENTSON .035X145CM (WIRE) ×3 IMPLANT
WIRE STIFF LUNDERQUIST 260CM (WIRE) ×3 IMPLANT
YANKAUER SUCT BULB TIP NO VENT (SUCTIONS) IMPLANT

## 2023-08-25 NOTE — Anesthesia Procedure Notes (Signed)
Central Venous Catheter Insertion Performed by: Mariann Barter, MD, anesthesiologist Start/End11/05/2023 7:45 AM, 08/25/2023 7:55 AM Patient location: Pre-op. Preanesthetic checklist: patient identified, IV checked, site marked, risks and benefits discussed, surgical consent, monitors and equipment checked, pre-op evaluation, timeout performed and anesthesia consent Position: Trendelenburg Lidocaine 1% used for infiltration and patient sedated Hand hygiene performed  and maximum sterile barriers used  Catheter size: 8.5 Fr Sheath introducer Procedure performed using ultrasound guided technique. Ultrasound Notes:anatomy identified, needle tip was noted to be adjacent to the nerve/plexus identified, no ultrasound evidence of intravascular and/or intraneural injection and image(s) printed for medical record Attempts: 1 Following insertion, line sutured and dressing applied. Post procedure assessment: blood return through all ports, free fluid flow and no air  Patient tolerated the procedure well with no immediate complications.

## 2023-08-25 NOTE — Op Note (Signed)
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 female with prior history of type B aortic dissection repaired with TEVAR.  To the proximal aspect degenerated with a pseudoaneurysm at zone 1-2 on the inferior aspect of the aorta.  The lesion was not amenable to endovascular repair.  After discussing the risks and benefits of thoracic arch debranching, zone 0 TEVAR, [place] elected to proceed.  FINDINGS:   Healthy zone 0 aorta.   Soft, atherosclerotic disease appreciated in all first-order branches in the aortic arch  TECHNIQUE:   Patient was brought to the OR laid in supine position.  General anesthesia was induced the patient was prepped draped standard fashion.  Along with central lines, a spinal drain was placed.  The case began with sternotomy, thoracic arch debranching with zone 0 grafting into into the innominate, left carotid, left subclavian arteries.  This was performed by myself and Dr. Laneta Simmers.  Please see his operative dictation for details.  Once thoracic arch debranching was completed, we moved to thoracic endovascular aortic repair.  An ultrasound was brought to the field and the right common femoral artery insonated.  A micropuncture needle was used to access the  common femoral artery in retrograde fashion.  Wire was run into the infrarenal aorta.  Next, 2 Pro-glide's were deployed in preclose fashion, followed by an 8 [place] sheath.  The patient was already heparinized from the open chest portion of the case.  Next, a pigtail catheter was run from the right common femoral artery into zone 0 of the aorta.  Angiography followed demonstrating excellent filling of the right subclavian, right carotid, left carotid, left subclavian from the trifurcated bypass graft that was sewn into zone 0.  The aneurysmal portion on the inferior aspect of the aorta was also appreciated under the Bird-Beaked stent graft.  The new zone 0 bypass was marked with a radiopaque ring.  Using preoperative measurements, a Cook alpha 36 mm x 113 mm graft was brought into the field and deployed in zone 0 immediately distal to the zone 0 bypass graft.  At this point, my anesthesia colleagues increased the MAP to 80 and began actively draining the spinal drain at 5 mL every 20 minutes.  Following deployment of the first graft, a 40 x 117 cm graft was brought to the field and deployed with 5 cm overlap.  Being that the previous Gore grafts were 45, I continue to telescope into the 45 using a Cook Zenith alpha 46 mm x 179 cm graft. Next, trilobed aortic balloon was brought onto the field and the balloon was inflated along the entirety of the endograft beginning at the first area of overlap.   Follow-up angiography demonstrated excellent result.  There was no significant filling of the false lumen retrograde  beyond the level of the thoracic endograft.  I was very happy with this.  Sheaths were removed and the common femoral artery closed using the previously deployed Pro-glide's.  There was an excellent signal in the foot and normal flow on diagnostic angiogram of the right common femoral artery.  Next, we moved back to the chest.  The patient was given 50 mg of protamine.  Please see Dr. Sharee Pimple note for  chest closure.    Victorino Sparrow, MD [facility] DATE OF DICTATION:   08/25/2023

## 2023-08-25 NOTE — Transfer of Care (Signed)
Immediate Anesthesia Transfer of Care Note  Patient: [name]  Procedure(s) Performed: AORTIC ARCH DEBRANCHING USING 12X8X8MM HEMASHIELD GRAFT (Chest) TRANSESOPHAGEAL ECHOCARDIOGRAM THORACIC AORTIC ENDOVASCULAR STENT GRAFT ULTRASOUND GUIDANCE FOR VASCULAR ACCESS, RIGHT FEMORAL ARTERY (Right: Groin)  Patient Location: ICU  Anesthesia Type:General  Level of Consciousness: sedated and Patient remains intubated per anesthesia plan  Airway & Oxygen Therapy: Patient remains intubated per anesthesia plan and Patient placed on Ventilator (see vital sign flow sheet for setting)  Post-op Assessment: Report given to RN and Post -op Vital signs reviewed and stable  Post vital signs: Reviewed and stable  Last Vitals:  Vitals Value Taken Time  BP 147/62 08/25/23 1437  Temp 36.6   Pulse 74 08/25/23 1437  Resp 28 08/25/23 1437  SpO2 96 % 08/25/23 1437   Dr. Anda Kraft at bedside.  Last Pain:  Vitals:   08/25/23 0628  PainSc: 0-No pain         Complications: There were no known notable events for this encounter.

## 2023-08-25 NOTE — Anesthesia Procedure Notes (Signed)
Lumbar Drain  Patient location during procedure: pre-op Start time: 08/25/2023 7:15 AM End time: 08/25/2023 7:30 AM Staffing Performed: anesthesiologist  Anesthesiologist: Mariann Barter, MD Performed by: Mariann Barter, MD Authorized by: Mariann Barter, MD   Preanesthetic Checklist Completed: patient identified, IV checked, site marked, risks and benefits discussed, surgical consent, monitors and equipment checked, pre-op evaluation and timeout performed Lumbar Puncture:  Patient position: sitting Prep: Betadine Patient monitoring: heart rate and cardiac monitor Approach: midline Location: L4-5 Injection technique: catheter Needle Needle type: Tuohy  Needle gauge: 16 G Needle length: 9 cm Catheter size: 19 g Assessment Events: cerebrospinal fluid Attempts: 1 CSF: clear Post Procedure: drain attached to lumbar drainage system, lumbar pressure transduced, site cleaned and sterile dressing applied

## 2023-08-25 NOTE — Op Note (Signed)
CARDIOVASCULAR SURGERY OPERATIVE NOTE  08/25/2023  Co-surgeons:  [name], MD and Victorino Sparrow, MD  Preoperative Diagnosis:  Aortic arch aneurysm, s/p TEVAR for type B aortic dissection.  Postoperative Diagnosis:  Same   Procedure:  Median Sternotomy 2.   Aortic arch de-branching with  aorta-innominate artery,  aorta-left carotid artery and aorta-left subclavian artery bypasses using a 30 x 12 x 8 x 8 mm Hemashield Platinum graft.  3.   Thoracic aortic endovascular stent graft.   Anesthesia:  General Endotracheal  Anesthesiologist: Dr. 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 Dr. Sherral Hammers and I reviewed the scans and felt that placement of another endovascular stent would first require de-branching of the aortic arch to allow the stent graft to cross the entire aortic arch with an adequate proximal landing zone. I discussed the operative procedure with the patient including alternatives, benefits and risks; including but not limited to bleeding, blood transfusion, infection, stroke, myocardial infarction, graft failure, heart block requiring a permanent pacemaker, organ  dysfunction, paresis or paralysis of the lower extremities, and death. [name] understands and agrees to proceed.   Preparation:  The patient was seen in the preoperative holding area and the correct patient, correct operation were confirmed with the patient after reviewing the medical record and catheterization. The consent was signed by me. Preoperative antibiotics were given. A multi-port sheath was placed in the right neck and bilateral radial arterial lines were placed by the anesthesia team. A spinal drain was also placed by anesthesia. The patient was taken back to the operating room and positioned supine on the operating room table. After being placed under general endotracheal anesthesia by the anesthesia team a foley catheter was placed. The left arm was placed out on an arm board and 45 degrees to allow brachial access if needed. The neck, chest, left arm, abdomen, and both legs were prepped with betadine soap and solution and draped in the usual sterile manner. A surgical time-out was taken and the correct patient and operative procedure were confirmed with the nursing and anesthesia staff.  De-branching of the aortic arch:  A median sternotomy was performed. The pericardium was opened in the midline. Right ventricular function appeared normal. The ascending aorta was normal in appearance and had no palpable plaque.   The brachiocephalic vein was mobilized. The left common carotid and innominate arteries were dissected free and encircled. The left subclavian artery was easily visible through the sternotomy approach and was mobilized and encircled with a blue loop. The patient was heparinized and ACT maintained greater than 250 with additional doses of heparin as needed. A [place] partial occlusion clamp was placed on the proximal ascending aorta biased to the right lateral surface. An aortotomy  was made and punched on both ends.   A 30 x 12 x 8 x 8  mm trifurcated Hemashield  Platinum vascular graft was prepared. ( Catalog # [id number] P0, SN [id number]). The 12 mm end was anastomosed to the proximal aorta in an end to side manner using 4-0 prolene continuous suture. A light coating of CoSeal was applied to seal needle holes. The partial occlusion clamp was removed and the graft branches flushed. The innominate artery was clamped at its origin from the bovine trunk and distally and divided. The proximal stump was oversewn with two layers of continuous 4-0 Prolene suture. The clamp was removed. The 12 mm sidearm graft was brought under the bracheocephalic vein and anastomosed to the  innominate  artery in an end to end manner using continuous 4-0 prolene suture. The graft was flushed and the clamps were removed.  The left common carotid artery was clamped proximally at its origin from the bovine trunk and distally and divided. The proximal stump was oversewn with two layers of continuous 4-0 Prolene suture. The clamp was removed. Then the 10 mm sidearm graft was brought under the bracheocephalic vein and cut to the appropriate length. It was anastomosed to the left common carotid artery in an end to end manner using continuous 5-0 prolene suture. The graft was flushed and the clamps were removed.   The left subclavian artery was clamped proximally at its origin from the aortic arch and distally and divided. The proximal stump was oversewn with two layers of continuous 4-0 Prolene suture. The clamp was removed. Then the other 10 mm sidearm graft was brought under the bracheocephalic vein and cut to the appropriate length. It was anastomosed to the left subclavian artery in an end to end manner using continuous 5-0 prolene suture. The graft was flushed and the clamps were removed. The anastomoses appeared hemostatic. The chest was packed with lap pads and the retractor removed.  Thoracic Endovascular Stent Graft:  See Dr. Karin Lieu' dictation for details.   After the TEVAR was  completed the patient was given 50 mg Protamine. Hemostasis was achieved. Two mediastinal drainage tubes were placed. The sternum was closed with  #6 stainless steel wires. The fascia was closed with continuous # 1 vicryl suture. The subcutaneous tissue was closed with 2-0 vicryl continuous suture. The skin was closed with 3-0 vicryl subcuticular suture. All sponge, needle, and instrument counts were reported correct at the end of the case. Dry sterile dressings were placed over the incisions and around the chest tubes which were connected to pleurevac suction. The patient was then transported to the surgical intensive care unit in stable condition.

## 2023-08-25 NOTE — Anesthesia Postprocedure Evaluation (Signed)
Anesthesia Post Note  Patient: [name]  Procedure(s) Performed: AORTIC ARCH DEBRANCHING USING 12X8X8MM HEMASHIELD GRAFT (Chest) TRANSESOPHAGEAL ECHOCARDIOGRAM THORACIC AORTIC ENDOVASCULAR STENT GRAFT ULTRASOUND GUIDANCE FOR VASCULAR ACCESS, RIGHT FEMORAL ARTERY (Right: Groin)     Patient location during evaluation: ICU Anesthesia Type: General Level of consciousness: sedated Pain management: pain level controlled Vital Signs Assessment: post-procedure vital signs reviewed and stable Respiratory status: patient remains intubated per anesthesia plan and patient on ventilator - see flowsheet for VS Cardiovascular status: stable Postop Assessment: no apparent nausea or vomiting Anesthetic complications: no   There were no known notable events for this encounter.  Last Vitals:  Vitals:   08/25/23 0553 08/25/23 1437  BP: (!) 144/76 (!) 147/62  Pulse: 73 74  Resp: 18 (!) 28  Temp: 36.8 C   SpO2: 97% 96%    Last Pain:  Vitals:   08/25/23 0628  PainSc: 0-No pain                 Mariann Barter

## 2023-08-25 NOTE — Addendum Note (Signed)
Addendum  created 08/25/23 1711 by Flonnie Hailstone, CRNA   Attestation recorded in [place], Intraprocedure Attestations filed

## 2023-08-25 NOTE — Interval H&P Note (Signed)
History and Physical Interval Note:  08/25/2023 6:30 AM  [name]  has presented today for surgery, with the diagnosis of Aneurysmal degeneration of the aortic arch proximal to the stent graft with possible pseudoaneurysm formation.  The various methods of treatment have been discussed with the patient and family. After consideration of risks, benefits and other options for treatment, the patient has consented to  Procedure(s) with comments: AORTIC ARCH DEBRANCHING (N/A) - with bypasses to the innominate and left common carotid arteries TRANSESOPHAGEAL ECHOCARDIOGRAM (N/A) THORACIC AORTIC ENDOVASCULAR STENT GRAFT (N/A) as a surgical intervention.  The patient's history has been reviewed, patient examined, no change in status, stable for surgery.  I have reviewed the patient's chart and labs.  Questions were answered to the patient's satisfaction.     [name]

## 2023-08-25 NOTE — H&P (Signed)
Preop note   History of Present Illness: [name] is a 57 y.o. 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  At her last visit, [name] was asked to undergo CT angio chest abdomen pelvis to assess for interval changes and recent TEVAR status post TBAD.  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  On exam today, [name] was doing well, accompanied by her daughter.  She notes no chest pain, back pain, shortness of breath.  Surgery was scheduled for several months ago, however the patient had a skin lesion at what would be the sternal incision which would likely be a nidus for infection.  Surgery was canceled, and rescheduled.  Since that time, [name] has been doing well.  She is trying to become more active.  She walked a mile not too long ago in a breast cancer walk with 40 of her family members.   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  Genes Analyzed (43  total): APC*, ATM*, AXIN2, BARD1, BMPR1A, BRCA1*, BRCA2*, BRIP1*, CDH1*, CDK4, CDKN2A, CHEK2*, DICER1, GALNT12, HOXB13, MEN1, MLH1*, MRE11A, MSH2*, MSH3, MSH6*, MUTYH*, NBN, NF1*, NTHL1, PALB2*, PMS2*, POLD1, POLE, PTEN*, RAD50, RAD51C*, RAD51D*, RET, SDHB, SDHD, SMAD4, SMARCA4, STK11 and TP53* (sequencing and deletion/duplication); EGFR (s   GERD 07/24/2006   GRAVE'S DISEASE 01/01/2008   History of hidradenitis suppurativa    History of kidney stones    History of thrush    HIV DISEASE 07/24/2006   dx March 05   Hyperlipidemia    HYPERTENSION 07/24/2006   Hyperthyroidism 08/2006   Grave's Disease -diffuse radiotracer uptake 08/25/06 Thyroid scan-Cold nodule to R lower lobe of thyrorid   Ileus (HCC) 02/14/2023   Menometrorrhagia    hx of   Nephrolithiasis    Panniculitis 05/12/2020   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HCC)    METASTATIC PAPILLARY THYROID CARCINOMA per 01/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 Completed 2 yrs Prednisone after lung bx confirmation. No symptoms since then.  Sebaceous cyst 04/21/2020   Suppurative hidradenitis    Thyroid cancer (HCC)    THYROID NODULE, RIGHT 02/08/2007    Past Surgical History:  Procedure Laterality Date   APPLICATION OF WOUND VAC N/A 01/20/2021   Procedure: APPLICATION OF WOUND VAC;  Surgeon: Peggye Form, DO;  Location: [facility];  Service: Plastics;  Laterality: N/A;   BREAST EXCISIONAL BIOPSY Right 04/26/2018   right axilla negative   BREAST EXCISIONAL BIOPSY Left  04/26/2018   left axilla negative   BREAST LUMPECTOMY Right 10/03/2018   malignant   BREAST LUMPECTOMY WITH RADIOACTIVE SEED AND SENTINEL LYMPH NODE BIOPSY Right 10/03/2018   Procedure: RIGHT BREAST LUMPECTOMY WITH RADIOACTIVE SEED AND SENTINEL LYMPH NODE MAPPING;  Surgeon: Harriette Bouillon, MD;  Location: [facility];  Service: General;  Laterality: Right;   BREAST SURGERY  1997   Breast Reduction    CYSTOSCOPY W/ URETERAL STENT REMOVAL  11/09/2012   Procedure: CYSTOSCOPY WITH STENT REMOVAL;  Surgeon: Sebastian Ache, MD;  Location: [facility];  Service: Urology;  Laterality: Right;   CYSTOSCOPY WITH RETROGRADE PYELOGRAM, URETEROSCOPY AND STENT PLACEMENT  11/09/2012   Procedure: CYSTOSCOPY WITH RETROGRADE PYELOGRAM, URETEROSCOPY AND STENT PLACEMENT;  Surgeon: Sebastian Ache, MD;  Location: [facility];  Service: Urology;  Laterality: Left;  LEFT URETEROSCOPY, STONE MANIPULATION, left STENT exchange    CYSTOSCOPY WITH STENT PLACEMENT  10/02/2012   Procedure: CYSTOSCOPY WITH STENT PLACEMENT;  Surgeon: Sebastian Ache, MD;  Location: [facility];  Service: Urology;  Laterality: Left;   DEBRIDEMENT AND CLOSURE WOUND N/A 01/20/2021   Procedure: Excision of abdominal wound with closure;  Surgeon: Peggye Form, DO;  Location: [facility];  Service: Plastics;  Laterality: N/A;   DILATION AND CURETTAGE OF UTERUS  11/2002   s/p for 1st trimester nonviable pregnancy   EYE SURGERY     sty under eyelid   INCISE AND DRAIN ABCESS  08/2002   s/p I &D for righ inframmary fold hidradenitis   INCISION AND DRAINAGE PERITONSILLAR ABSCESS  12/2001   IR CV LINE INJECTION  06/07/2018   IR FLUORO GUIDE CV LINE RIGHT  01/30/2023   IR IMAGING GUIDED PORT INSERTION  06/20/2018   IR REMOVAL TUN ACCESS W/ PORT W/O FL MOD SED  06/20/2018   IR US GUIDE VASC ACCESS RIGHT  01/30/2023   IRRIGATION AND DEBRIDEMENT ABSCESS  01/31/2012   Procedure: IRRIGATION AND DEBRIDEMENT ABSCESS;  Surgeon: Kandis Cocking, MD;  Location:  [facility];  Service: General;  Laterality: Right;  right breast and axilla    NEPHROLITHOTOMY  10/02/2012   Procedure: NEPHROLITHOTOMY PERCUTANEOUS;  Surgeon: Sebastian Ache, MD;  Location: [facility];  Service: Urology;  Laterality: Right;  First Stage Percutaneous Nephrolithotomy with Surgeon Access, Left Ureteral Stent     NEPHROLITHOTOMY  10/04/2012   Procedure: NEPHROLITHOTOMY PERCUTANEOUS SECOND LOOK;  Surgeon: Sebastian Ache, MD;  Location: [facility];  Service: Urology;  Laterality: Right;      NEPHROLITHOTOMY  10/08/2012   Procedure: NEPHROLITHOTOMY PERCUTANEOUS;  Surgeon: Sebastian Ache, MD;  Location: [facility];  Service: Urology;  Laterality: Right;  THIRD STAGE, nephrostomy tube exchange x 2   NEPHROLITHOTOMY  10/11/2012   Procedure: NEPHROLITHOTOMY PERCUTANEOUS SECOND LOOK;  Surgeon: Sebastian Ache, MD;  Location: [facility];  Service: Urology;  Laterality: Right;  RIGHT 4 STAGE PERCUTANOUS NEPHROLITHOTOMY, right URETEROSCOPY WITH HOLMIUM LASER    PANNICULECTOMY N/A 12/21/2020   Procedure: PANNICULECTOMY;  Surgeon: Peggye Form, DO;  Location: [facility];  Service: Plastics;  Laterality: N/A;   PERCUTANEOUS NEPHROSTOLITHOTOMY  04/2022   PORT-A-CATH REMOVAL N/A 07/16/2020   Procedure: REMOVAL PORT-A-CATH;  Surgeon: Harriette Bouillon, MD;  Location: [facility];  Service: General;  Laterality: N/A;   PORTACATH PLACEMENT Left 05/17/2018   Procedure: INSERTION PORT-A-CATH;  Surgeon: Abigail Miyamoto, MD;  Location: [facility];  Service: General;  Laterality: Left;   RADICAL NECK DISSECTION  04/12/2017   limited/notes 04/12/2017   RADICAL NECK DISSECTION N/A 04/12/2017   Procedure: RADICAL NECK DISSECTION;  Surgeon: Christia Reading, MD;  Location[facility];  Service: ENT;  Laterality: N/A;  limited neck dissection 2 hours total   REDUCTION MAMMAPLASTY Bilateral 1998   RIGHT/LEFT HEART CATH AND CORONARY ANGIOGRAPHY N/A 03/12/2020   Procedure: RIGHT/LEFT HEART CATH AND CORONARY  ANGIOGRAPHY;  Surgeon: Dolores Patty, MD;  Location: [facility];  Service: Cardiovascular;  Laterality: N/A;   [place]  1994   THORACIC AORTIC ENDOVASCULAR STENT GRAFT N/A 02/02/2023   Procedure: THORACIC AORTIC ENDOVASCULAR STENT GRAFT;  Surgeon: Victorino Sparrow, MD;  Location: [facility] OR;  Service: Vascular;  Laterality: N/A;   THYROIDECTOMY  04/12/2017   completion/notes 04/12/2017   THYROIDECTOMY N/A 04/12/2017   Procedure: THYROIDECTOMY;  Surgeon: Christia Reading, MD;  Location: [facility] OR;  Service: ENT;  Laterality: N/A;  Completion Thyroidectomy   TOTAL THYROIDECTOMY  2010    Current Meds  Medication Sig   acetaminophen (TYLENOL) 500 MG tablet Take 1,000 mg by mouth every 6 (six) hours as needed for mild pain.   allopurinol (ZYLOPRIM) 100 MG tablet TAKE 1 TABLET BY MOUTH EVERY DAY FOR GOUT PREVENTION   amLODipine (NORVASC) 5 MG tablet Take 10 mg by mouth daily.   aspirin 81 MG chewable tablet Chew 81 mg by mouth daily.   AURYXIA 1 GM 210 MG(Fe) tablet Take 210 mg by mouth 3 (three) times daily with meals.   carvedilol (COREG) 25 MG tablet Take 1 tablet (25 mg total) by mouth 2 (two) times daily.   dolutegravir ([name]) 50 MG tablet Take 1 tablet (50 mg total) by mouth daily.   emtricitabine-tenofovir AF (DESCOVY) 200-25 MG tablet Take 1 tablet by mouth daily.   levocetirizine (XYZAL) 5 MG tablet Take 1 tablet (5 mg total) by mouth every evening. For allergies   levothyroxine (SYNTHROID) 175 MCG tablet TAKE 1 TABLET BY MOUTH EVERY DAY   losartan (COZAAR) 50 MG tablet Take 1 tablet (50 mg total) by mouth daily. for blood pressure.   metoCLOPramide (REGLAN) 5 MG tablet Take 1 tablet (5 mg total) by mouth every 6 (six) hours as needed for nausea.   minocycline (DYNACIN) 100 MG tablet Take 1 tablet (100 mg total) by mouth 2 (two) times daily. (Patient taking differently: Take 100 mg by mouth daily.)   rosuvastatin (CRESTOR) 10 MG tablet TAKE 1 TABLET BY MOUTH EVERY DAY FOR CHOLESTEROL    saxagliptin HCl (ONGLYZA) 2.5 MG TABS tablet Take 1 tablet (2.5 mg total) by mouth daily. for diabetes.   traMADol (ULTRAM) 50 MG tablet Take 1 tablet (50 mg total) by mouth every 6 (six) hours as needed for moderate pain.   [DISCONTINUED] levothyroxine (SYNTHROID) 175 MCG tablet TAKE 1 TABLET BY MOUTH EVERY DAY    12 system ROS was negative unless otherwise noted in HPI  Physical Exam Constitutional:      Appearance: Normal appearance. She is obese.  HENT:     Head: Normocephalic.     Mouth/Throat:     Pharynx: Oropharynx  is clear.  Eyes:     Extraocular Movements: Extraocular movements intact.     Pupils: Pupils are equal, round, and reactive to light.  Cardiovascular:     Rate and Rhythm: Normal rate and regular rhythm.     Pulses: Normal pulses.  Pulmonary:     Effort: Pulmonary effort is normal.  Abdominal:     General: There is no distension.     Palpations: Abdomen is soft.     Tenderness: There is no abdominal tenderness.  Musculoskeletal:        General: No swelling.  Skin:    General: Skin is warm and dry.     Coloration: Skin is not jaundiced.  Neurological:     General: No focal deficit present.     Mental Status: She is alert and oriented to person, place, and time.       Observations/Objective:  CT angio reveals slipping of the TEVAR, with aneurysmal degeneration proximally.  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  There is also bird beaking of the graft.  Assessment and Plan:  Patient is status post TEVAR for symptomatic TBAD.  Interval imaging today demonstrates significant degeneration over the last 2 months at the proximal aspect of the graft.  The graft has slipped, and now appears to be in zone 3.  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  [name] will need surgical revision.  Initially, I thought  this could be repaired from an endovascular standpoint, however due to the acute angle, and lack of flexibility within the graft, I think she would have a suboptimal result that likely would not seal.  I have reached out to my colleague Dr. Laneta Simmers to discuss arch to branching.  After assessing her CT scan, he felt as though she would be a good anatomic candidate.  I have placed a referral to his office for the 2 to meet.  At the time of the branching, my plan would be to perform thoracic stenting as well as left subclavian laser fenestration.  [name] and I also talked about her visceral aorta, and some of the growth appreciated at the level of the celiac artery.  My plan at the time of surgery is to balloon angioplasty and try to rupture the septum of the dissection with or without [place] technique.  She is aware that she is not a great surgical candidate, especially with her end-stage renal disease.  She is currently being dialyzed through a right-sided TDC.  Unfortunately, I do not think she has any viable endovascular options. Along with risk of death we had a long discussion regarding stroke and paralysis due to spinal cord ischemia.  After discussing the risks and benefits of the above, [name] elected to proceed.  The case will be performed with my colleague Dr. Laneta Simmers.    Signed, Victorino Sparrow [facility] Office: [phone]  08/25/2023, 7:28 AM

## 2023-08-25 NOTE — Anesthesia Procedure Notes (Signed)
Procedure Name: Intubation Date/Time: 08/25/2023 8:33 AM  Performed by: Flonnie Hailstone, CRNAPre-anesthesia Checklist: Patient identified, Emergency Drugs available, Suction available and Patient being monitored Patient Re-evaluated:Patient Re-evaluated prior to induction Oxygen Delivery Method: Circle system utilized Preoxygenation: Pre-oxygenation with 100% oxygen Induction Type: IV induction and Rapid sequence Laryngoscope Size: Glidescope and 3 Grade View: Grade I Tube type: Oral Tube size: 7.5 mm Number of attempts: 1 Airway Equipment and Method: Stylet, Rigid stylet and Video-laryngoscopy Placement Confirmation: ETT inserted through vocal cords under direct vision, positive ETCO2 and breath sounds checked- equal and bilateral Secured at: 23 cm Tube secured with: Tape Dental Injury: Teeth and Oropharynx as per pre-operative assessment  Comments: Glidescope used r/t pt hx of radical neck dissection; easy glidescope intubation atraumatic; ebbs coarse throughout anterior lobes

## 2023-08-25 NOTE — Brief Op Note (Addendum)
08/25/2023  2:35 PM  PATIENT:  [name]  57 y.o. 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  PROCEDURE:  Procedure(s) with comments: MEDIAN STERNTOMY AORTIC ARCH DEBRANCHING USING 12X8X8MM HEMASHIELD GRAFT (N/A) - with bypasses to the innominate, left common carotid, and left subclavian arteries THORACIC AORTIC ENDOVASCULAR STENT GRAFT (N/A) ULTRASOUND GUIDANCE FOR VASCULAR ACCESS, RIGHT FEMORAL ARTERY (Right)  SURGEON:  Surgeons and Role: Panel 1:    * [name], MD - Primary Panel 2:    * Victorino Sparrow, MD - Primary  PHYSICIAN ASSISTANT: none  ASSISTANTS: RNFA   ANESTHESIA:   general  EBL:  800 mL     DRAINS:  Two Chest Tube(s) in the mediastinum    LOCAL MEDICATIONS USED:  NONE  SPECIMEN:  No Specimen  DISPOSITION OF SPECIMEN:  N/A  COUNTS:  YES  TOURNIQUET:  * No tourniquets in log *  DICTATION: .Note written in EPIC  PLAN OF CARE: Admit to inpatient   PATIENT DISPOSITION:  ICU - intubated and hemodynamically stable.   Delay start of Pharmacological VTE agent (>24hrs) due to surgical blood loss or risk of bleeding: yes

## 2023-08-25 NOTE — Consult Note (Signed)
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  She underwent TEVAR back in April of this year for a type B aortic dissection and subsequent follow-up imaging showed some slipping of the endovascular graft with aneurysmal dilatation of the aorta.  Today she underwent median sternotomy with aortic arch deep branching with Hemashield graft (with bypasses into innominate, left common carotid and left subclavian) that was done in conjunction by vascular and cardiothoracic surgery service.  She is seen postoperatively in the cardiac ICU for ongoing ESRD care.  She is currently on low-dose pressors as recommended for MAP parameters postoperatively and remains ventilator dependent.  Dialysis prescription: Monday/Wednesday/Friday at [facility] ([place] last dialyzed yesterday in anticipation for surgery today.  180 dialyzer, 4 hours, BFR 400/DFR 600, EDW 80.1 kg, 2K/2.0 calcium, right IJ TDC, heparin 5000 unit bolus, Hectorol 2 mcg IV 3 times weekly  Past Medical History:  Diagnosis Date   Acute pancreatitis 10/23/2021   Acute renal failure superimposed on stage 4 chronic kidney disease (HCC) 10/24/2021   Allergy    Anemia    Normocytic   Antibiotic-induced yeast infection 09/28/2020   Anxiety    Asthma    Bronchitis 2005   Bursitis of left shoulder 09/06/2019   CKD (chronic kidney disease)    CLASS 1-EXOPHTHALMOS-THYROTOXIC 02/08/2007   COVID-19 long hauler 05/11/2021   COVID-19 virus infection 04/28/2022   Diabetes mellitus without complication [facility])    Dissecting abdominal aortic aneurysm  (HCC) 02/05/2023   Encephalopathy acute 02/02/2023   Family history of breast cancer    Family history of lung cancer    Family history of prostate cancer    Gastroenteritis 07/10/2007   Genetic testing 07/25/2018   Custom[employer] + RNA Insight was ordered.  Genes Analyzed (43 total): APC*, ATM*, AXIN2, BARD1, BMPR1A, BRCA1*, BRCA2*, BRIP1*, CDH1*, CDK4, CDKN2A, CHEK2*, DICER1, GALNT12, HOXB13, MEN1, MLH1*, MRE11A, MSH2*, MSH3, MSH6*, MUTYH*, NBN, NF1*, NTHL1, PALB2*, PMS2*, POLD1, POLE, PTEN*, RAD50, RAD51C*, RAD51D*, RET, SDHB, SDHD, SMAD4, SMARCA4, STK11 and TP53* (sequencing and deletion/duplication); EGFR (s   GERD 07/24/2006   GRAVE'S DISEASE 01/01/2008   History of hidradenitis suppurativa    History of kidney stones    History of thrush    HIV DISEASE 07/24/2006   dx March 05   Hyperlipidemia    HYPERTENSION 07/24/2006   Hyperthyroidism 08/2006   Grave's Disease -diffuse radiotracer uptake 08/25/06 Thyroid scan-Cold nodule to R lower lobe of thyrorid   Ileus (HCC) 02/14/2023   Menometrorrhagia    hx of   Nephrolithiasis    Panniculitis 05/12/2020   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HCC)    METASTATIC PAPILLARY THYROID CARCINOMA per 01/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   Peripheral vascular disease (HCC)    Personal history of chemotherapy    2020   Personal history of radiation therapy    2020   Pneumonia 2005   Port-A-Cath in place 07/12/2018   Postsurgical hypothyroidism 03/20/2011   Rash 02/16/2012   Renal calculi 10/29/2014   Sarcoidosis 02/08/2007   dx as a teenager in [place] from abnl CXR. Completed 2 yrs Prednisone after lung bx confirmation. No symptoms  since then.   Sebaceous cyst 04/21/2020   Suppurative hidradenitis    Thyroid cancer (HCC)    THYROID NODULE, RIGHT 02/08/2007    Past Surgical History:  Procedure Laterality Date   APPLICATION OF WOUND  VAC N/A 01/20/2021   Procedure: APPLICATION OF WOUND VAC;  Surgeon: Peggye Form, DO;  Location: [facility];  Service: Plastics;  Laterality: N/A;   BREAST EXCISIONAL BIOPSY Right 04/26/2018   right axilla negative   BREAST EXCISIONAL BIOPSY Left 04/26/2018   left axilla negative   BREAST LUMPECTOMY Right 10/03/2018   malignant   BREAST LUMPECTOMY WITH RADIOACTIVE SEED AND SENTINEL LYMPH NODE BIOPSY Right 10/03/2018   Procedure: RIGHT BREAST LUMPECTOMY WITH RADIOACTIVE SEED AND SENTINEL LYMPH NODE MAPPING;  Surgeon: Harriette Bouillon, MD;  Location: [facility];  Service: General;  Laterality: Right;   BREAST SURGERY  1997   Breast Reduction    CYSTOSCOPY W/ URETERAL STENT REMOVAL  11/09/2012   Procedure: CYSTOSCOPY WITH STENT REMOVAL;  Surgeon: Sebastian Ache, MD;  Location: [facility];  Service: Urology;  Laterality: Right;   CYSTOSCOPY WITH RETROGRADE PYELOGRAM, URETEROSCOPY AND STENT PLACEMENT  11/09/2012   Procedure: CYSTOSCOPY WITH RETROGRADE PYELOGRAM, URETEROSCOPY AND STENT PLACEMENT;  Surgeon: Sebastian Ache, MD;  Location: [facility];  Service: Urology;  Laterality: Left;  LEFT URETEROSCOPY, STONE MANIPULATION, left STENT exchange    CYSTOSCOPY WITH STENT PLACEMENT  10/02/2012   Procedure: CYSTOSCOPY WITH STENT PLACEMENT;  Surgeon: Sebastian Ache, MD;  Location: [facility];  Service: Urology;  Laterality: Left;   DEBRIDEMENT AND CLOSURE WOUND N/A 01/20/2021   Procedure: Excision of abdominal wound with closure;  Surgeon: Peggye Form, DO;  Location:  SURGERY CENTER;  Service: Plastics;  Laterality: N/A;   DILATION AND CURETTAGE OF UTERUS  11/2002   s/p for 1st trimester nonviable pregnancy   EYE SURGERY     sty under eyelid   INCISE AND DRAIN ABCESS  08/2002   s/p I &D for righ inframmary fold hidradenitis   INCISION AND DRAINAGE PERITONSILLAR ABSCESS  12/2001   IR CV LINE INJECTION  06/07/2018   IR FLUORO GUIDE CV LINE RIGHT  01/30/2023   IR IMAGING GUIDED  PORT INSERTION  06/20/2018   IR REMOVAL TUN ACCESS W/ PORT W/O FL MOD SED  06/20/2018   IR US GUIDE VASC ACCESS RIGHT  01/30/2023   IRRIGATION AND DEBRIDEMENT ABSCESS  01/31/2012   Procedure: IRRIGATION AND DEBRIDEMENT ABSCESS;  Surgeon: Kandis Cocking, MD;  Location: [facility];  Service: General;  Laterality: Right;  right breast and axilla    NEPHROLITHOTOMY  10/02/2012   Procedure: NEPHROLITHOTOMY PERCUTANEOUS;  Surgeon: Sebastian Ache, MD;  Location: [facility];  Service: Urology;  Laterality: Right;  First Stage Percutaneous Nephrolithotomy with Surgeon Access, Left Ureteral Stent     NEPHROLITHOTOMY  10/04/2012   Procedure: NEPHROLITHOTOMY PERCUTANEOUS SECOND LOOK;  Surgeon: Sebastian Ache, MD;  Location: [facility];  Service: Urology;  Laterality: Right;      NEPHROLITHOTOMY  10/08/2012   Procedure: NEPHROLITHOTOMY PERCUTANEOUS;  Surgeon: Sebastian Ache, MD;  Location: [facility];  Service: Urology;  Laterality: Right;  THIRD STAGE, nephrostomy tube exchange x 2   NEPHROLITHOTOMY  10/11/2012   Procedure: NEPHROLITHOTOMY PERCUTANEOUS SECOND LOOK;  Surgeon: Sebastian Ache, MD;  Location: [facility];  Service: Urology;  Laterality: Right;  RIGHT 4 STAGE PERCUTANOUS NEPHROLITHOTOMY, right URETEROSCOPY WITH HOLMIUM LASER    PANNICULECTOMY N/A 12/21/2020   Procedure: PANNICULECTOMY;  Surgeon: Peggye Form, DO;  Location: [facility]  OR;  Service: Government social research officer;  Laterality: N/A;   PERCUTANEOUS NEPHROSTOLITHOTOMY  04/2022   PORT-A-CATH REMOVAL N/A 07/16/2020   Procedure: REMOVAL PORT-A-CATH;  Surgeon: Harriette Bouillon, MD;  Location: [facility];  Service: General;  Laterality: N/A;   PORTACATH PLACEMENT Left 05/17/2018   Procedure: INSERTION PORT-A-CATH;  Surgeon: Abigail Miyamoto, MD;  Location: [facility];  Service: General;  Laterality: Left;   RADICAL NECK DISSECTION  04/12/2017   limited/notes 04/12/2017   RADICAL NECK DISSECTION N/A 04/12/2017   Procedure: RADICAL NECK DISSECTION;   Surgeon: Christia Reading, MD;  Location: [facility] OR;  Service: ENT;  Laterality: N/A;  limited neck dissection 2 hours total   REDUCTION MAMMAPLASTY Bilateral 1998   RIGHT/LEFT HEART CATH AND CORONARY ANGIOGRAPHY N/A 03/12/2020   Procedure: RIGHT/LEFT HEART CATH AND CORONARY ANGIOGRAPHY;  Surgeon: Dolores Patty, MD;  [facility]: [facility];  Service: Cardiovascular;  Laterality: N/A;   [place]  1994   THORACIC AORTIC ENDOVASCULAR STENT GRAFT N/A 02/02/2023   Procedure: THORACIC AORTIC ENDOVASCULAR STENT GRAFT;  Surgeon: Victorino Sparrow, MD;  Location: [facility] OR;  Service: Vascular;  Laterality: N/A;   THYROIDECTOMY  04/12/2017   completion/notes 04/12/2017   THYROIDECTOMY N/A 04/12/2017   Procedure: THYROIDECTOMY;  Surgeon: Christia Reading, MD;  Location: [facility] OR;  Service: ENT;  Laterality: N/A;  Completion Thyroidectomy   TOTAL THYROIDECTOMY  2010    Family History  Problem Relation Age of Onset   Hypertension Mother    Cancer Mother        laryngeal   Heart disease Mother        stent   Pancreatic cancer Father    Hypertension Father    Lung cancer Father 21       hx smoking   Hypertension Sister    Hypertension Sister    Hypertension Brother    Breast cancer Maternal Aunt 46   Breast cancer Maternal Aunt        dx 60+   Cancer Maternal Uncle        Lung CA   Breast cancer Paternal Aunt 73   Breast cancer Paternal Aunt        dx 103's   Breast cancer Paternal Aunt        dx 50's   Prostate cancer Paternal Uncle    Prostate cancer Paternal Uncle    Lung cancer Paternal Uncle    Breast cancer Cousin 68   Breast cancer Cousin        dx <50   Breast cancer Cousin        dx <50   Breast cancer Cousin        dx <50   Heart disease Other    Hypertension Other    Stroke Other        Grandparent   Kidney disease Other        Grandparent   Diabetes Other        FH of Diabetes   Colon cancer Neg Hx    Esophageal cancer Neg Hx    Rectal cancer Neg Hx    Stomach cancer Neg  Hx     Social History:  reports that she quit smoking about 5 years ago. Her smoking use included cigarettes. She started smoking about 6 years ago. She has a 7.5 pack-year smoking history. She has never used smokeless tobacco. She reports current alcohol use. She reports that she does not use drugs.  Allergies:  Allergies  Allergen  Reactions   Genvoya [Elviteg-Cobic-Emtricit-Tenofaf] Hives   Lisinopril Cough   Aldactone [Spironolactone] Hives   Tegaderm Ag Mesh [Silver] Itching    Medications: I have reviewed the patient's current medications. Scheduled:  [START ON 08/26/2023] acetaminophen  1,000 mg Oral Q6H   Or   [START ON 08/26/2023] acetaminophen (TYLENOL) oral liquid 160 mg/5 mL  1,000 mg Per Tube Q6H   [START ON 08/26/2023] allopurinol  100 mg Oral Daily   [START ON 08/26/2023] aspirin EC  325 mg Oral Daily   Or   [START ON 08/26/2023] aspirin  324 mg Per Tube Daily   [START ON 08/26/2023] bisacodyl  10 mg Oral Daily   Or   [START ON 08/26/2023] bisacodyl  10 mg Rectal Daily   budesonide  0.25 mg Nebulization BID   Chlorhexidine Gluconate Cloth  6 each Topical Daily   [START ON 08/26/2023] docusate sodium  200 mg Oral Daily   [START ON 08/26/2023] dolutegravir  50 mg Oral Daily   [START ON 08/26/2023] emtricitabine-tenofovir AF  1 tablet Oral Daily   [START ON 08/26/2023] ferric citrate  210 mg Oral TID WC   [START ON 08/26/2023] heparin  5,000 Units Subcutaneous Q12H   [START ON 08/26/2023] levothyroxine  175 mcg Oral Daily   methadone  0.3 mg/kg (Ideal) Intravenous Once   metoCLOPramide (REGLAN) injection  10 mg Intravenous Q6H   metoprolol tartrate  12.5 mg Oral BID   Or   metoprolol tartrate  12.5 mg Per Tube BID   mouth rinse  15 mL Mouth Rinse Q2H   [START ON 08/27/2023] pantoprazole  40 mg Oral Daily   pantoprazole (PROTONIX) IV  40 mg Intravenous QHS   [START ON 08/26/2023] rosuvastatin  10 mg Oral Daily   [START ON 08/26/2023] sodium chloride flush  3 mL Intravenous Q12H    Continuous:  sodium chloride 20 mL/hr at 08/25/23 1700   [START ON 08/26/2023] sodium chloride     albumin human      ceFAZolin (ANCEF) IV Stopped (08/25/23 1648)   dexmedetomidine (PRECEDEX) IV infusion 0.7 mcg/kg/hr (08/25/23 1700)   insulin 3.2 Units/hr (08/25/23 1700)   nitroGLYCERIN Stopped (08/25/23 1604)   norepinephrine (LEVOPHED) Adult infusion 5 mcg/min (08/25/23 1700)   phenylephrine (NEO-SYNEPHRINE) Adult infusion Stopped (08/25/23 1641)   propofol (DIPRIVAN) infusion 30 mcg/kg/min (08/25/23 1700)   vancomycin         Latest Ref Rng & Units 08/25/2023    5:59 AM 08/23/2023   11:13 AM 07/11/2023    2:50 PM  BMP  Glucose 70 - 99 mg/dL 161  096  045   BUN 6 - 20 mg/dL 31  31  19    Creatinine 0.44 - 1.00 mg/dL 4.09  8.11  9.14   Sodium 135 - 145 mmol/L 140  136  136   Potassium 3.5 - 5.1 mmol/L 3.8  5.4  3.4   Chloride 98 - 111 mmol/L 101  97  96   CO2 22 - 32 mmol/L  23  29   Calcium 8.9 - 10.3 mg/dL  6.8  8.6       Latest Ref Rng & Units 08/25/2023    3:11 PM 08/25/2023    5:59 AM 08/23/2023   11:13 AM  CBC  WBC 4.0 - 10.5 K/uL 15.4   7.1   Hemoglobin 12.0 - 15.0 g/dL 8.6  78.2  95.6   Hematocrit 36.0 - 46.0 % 28.0  37.0  37.8   Platelets 150 - 400 K/uL 179  221    EP STUDY  Result Date: 08/25/2023 See surgical note for result.  PERIPHERAL VASCULAR CATHETERIZATION  Result Date: 08/25/2023 See surgical note for result.  HYBRID OR IMAGING ([facility] ONLY)  Result Date: 08/25/2023 There is no interpretation for this exam.  This order is for images obtained during a surgical procedure.  Please See "Surgeries" Tab for more information regarding the procedure.    Review of Systems  Unable to perform ROS: Intubated   Blood pressure 125/68, pulse 69, temperature 98.2 F (36.8 C), resp. rate 19, height 5\' 5"  (1.651 m), weight 78.5 kg, last menstrual period 03/31/2014, SpO2 97%. Physical Exam Vitals and nursing note reviewed.  Constitutional:      Appearance: She is  obese.     Comments: Intubated, sedated.  Seen postoperatively  HENT:     Head: Normocephalic and atraumatic.     Nose: Nose normal.     Mouth/Throat:     Comments: Intubated, on ventilator Eyes:     Conjunctiva/sclera: Conjunctivae normal.  Cardiovascular:     Rate and Rhythm: Normal rate and regular rhythm.     Pulses: Normal pulses.     Heart sounds: Normal heart sounds.     Comments: Mediastinal drains x 2 in situ. Pulmonary:     Breath sounds: Normal breath sounds. No wheezing or rales.     Comments: Right IJ TDC in place Abdominal:     General: Abdomen is flat. Bowel sounds are normal.     Palpations: Abdomen is soft.  Musculoskeletal:     Cervical back: Neck supple.     Right lower leg: No edema.     Left lower leg: No edema.  Skin:    General: Skin is warm and dry.    Assessment/Plan: 1.  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  Seen postoperatively while on low-dose pressors and on ventilator support. 2.  End-stage renal disease: Routinely on Monday/Wednesday/Friday dialysis schedule and has been dialyzed off schedule this week to accommodate for surgery.  I will reevaluate the patient tomorrow morning to decide on modality for renal replacement (regular dialysis versus CRRT).  Electrolytes and volume status appear to be noncritical at this time. 3.  Anemia of chronic disease: Likely compounded by perioperative blood loss, will monitor hemoglobin and hematocrit trend to decide on need for ESA versus PRBC transfusion. 4.  Secondary hyperparathyroidism: Will follow calcium and phosphorus levels and resume low phosphorus renal diet when able to take p.o. oral route.  Will restart Hectorol for PTH control with dialysis. 5.  HIV infection: Resume antiretroviral therapy 6.  Hypertension: Currently on low-dose pressor to maintain  recommended postoperative MAP per cardiothoracic surgery recommendations status post stent placement.  Dagoberto Ligas 08/25/2023, 5:35 PM

## 2023-08-25 NOTE — Anesthesia Procedure Notes (Signed)
Arterial Line Insertion Start/End11/05/2023 7:10 AM, 08/25/2023 7:15 AM Performed by: Mariann Barter, MD, Flonnie Hailstone, CRNA, CRNA  Patient location: Pre-op. Preanesthetic checklist: patient identified, IV checked, site marked, risks and benefits discussed, surgical consent, monitors and equipment checked, pre-op evaluation, timeout performed and anesthesia consent Lidocaine 1% used for infiltration Left, radial was placed Catheter size: 20 G Hand hygiene performed  and Seldinger technique used  Attempts: 1 Procedure performed without using ultrasound guided technique. Following insertion, dressing applied and Biopatch. Post procedure assessment: normal  Patient tolerated the procedure well with no immediate complications.

## 2023-08-26 ENCOUNTER — Inpatient Hospital Stay (HOSPITAL_COMMUNITY): Payer: Medicare HMO

## 2023-08-26 ENCOUNTER — Encounter (HOSPITAL_COMMUNITY): Payer: Self-pay | Admitting: Surgery

## 2023-08-26 LAB — GLUCOSE, CAPILLARY
Glucose-Capillary: 108 mg/dL — ABNORMAL HIGH (ref 70–99)
Glucose-Capillary: 121 mg/dL — ABNORMAL HIGH (ref 70–99)
Glucose-Capillary: 124 mg/dL — ABNORMAL HIGH (ref 70–99)
Glucose-Capillary: 126 mg/dL — ABNORMAL HIGH (ref 70–99)
Glucose-Capillary: 127 mg/dL — ABNORMAL HIGH (ref 70–99)
Glucose-Capillary: 130 mg/dL — ABNORMAL HIGH (ref 70–99)
Glucose-Capillary: 136 mg/dL — ABNORMAL HIGH (ref 70–99)
Glucose-Capillary: 140 mg/dL — ABNORMAL HIGH (ref 70–99)
Glucose-Capillary: 141 mg/dL — ABNORMAL HIGH (ref 70–99)
Glucose-Capillary: 143 mg/dL — ABNORMAL HIGH (ref 70–99)
Glucose-Capillary: 149 mg/dL — ABNORMAL HIGH (ref 70–99)
Glucose-Capillary: 160 mg/dL — ABNORMAL HIGH (ref 70–99)
Glucose-Capillary: 166 mg/dL — ABNORMAL HIGH (ref 70–99)
Glucose-Capillary: 196 mg/dL — ABNORMAL HIGH (ref 70–99)
Glucose-Capillary: 97 mg/dL (ref 70–99)

## 2023-08-26 LAB — COMPREHENSIVE METABOLIC PANEL
ALT: 10 U/L (ref 0–44)
ALT: 8 U/L (ref 0–44)
ALT: <5 U/L (ref 0–44)
ALT: <5 U/L (ref 0–44)
ALT: <5 U/L (ref 0–44)
AST: 15 U/L (ref 15–41)
AST: 16 U/L (ref 15–41)
AST: 15 U/L (ref 15–41)
AST: 16 U/L (ref 15–41)
AST: 17 U/L (ref 15–41)
Albumin: 2.5 g/dL — ABNORMAL LOW (ref 3.5–5.0)
Albumin: 2.7 g/dL — ABNORMAL LOW (ref 3.5–5.0)
Albumin: 2.5 g/dL — ABNORMAL LOW (ref 3.5–5.0)
Albumin: 2.8 g/dL — ABNORMAL LOW (ref 3.5–5.0)
Albumin: 2.9 g/dL — ABNORMAL LOW (ref 3.5–5.0)
Alkaline Phosphatase: 76 U/L (ref 38–126)
Alkaline Phosphatase: 83 U/L (ref 38–126)
Alkaline Phosphatase: 71 U/L (ref 38–126)
Alkaline Phosphatase: 78 U/L (ref 38–126)
Alkaline Phosphatase: 84 U/L (ref 38–126)
Anion gap: 13 (ref 5–15)
Anion gap: 13 (ref 5–15)
Anion gap: 13 (ref 5–15)
Anion gap: 14 (ref 5–15)
Anion gap: 16 — ABNORMAL HIGH (ref 5–15)
BUN: 38 mg/dL — ABNORMAL HIGH (ref 6–20)
BUN: 43 mg/dL — ABNORMAL HIGH (ref 6–20)
BUN: 48 mg/dL — ABNORMAL HIGH (ref 6–20)
BUN: 51 mg/dL — ABNORMAL HIGH (ref 6–20)
BUN: 55 mg/dL — ABNORMAL HIGH (ref 6–20)
CO2: 22 mmol/L (ref 22–32)
CO2: 22 mmol/L (ref 22–32)
CO2: 21 mmol/L — ABNORMAL LOW (ref 22–32)
CO2: 19 mmol/L — ABNORMAL LOW (ref 22–32)
CO2: 18 mmol/L — ABNORMAL LOW (ref 22–32)
Calcium: 9 mg/dL (ref 8.9–10.3)
Calcium: 9.7 mg/dL (ref 8.9–10.3)
Calcium: 9.1 mg/dL (ref 8.9–10.3)
Calcium: 8.8 mg/dL — ABNORMAL LOW (ref 8.9–10.3)
Calcium: 8.9 mg/dL (ref 8.9–10.3)
Chloride: 101 mmol/L (ref 98–111)
Chloride: 102 mmol/L (ref 98–111)
Chloride: 102 mmol/L (ref 98–111)
Chloride: 104 mmol/L (ref 98–111)
Chloride: 103 mmol/L (ref 98–111)
Creatinine, Ser: 4.76 mg/dL — ABNORMAL HIGH (ref 0.44–1.00)
Creatinine, Ser: 5.15 mg/dL — ABNORMAL HIGH (ref 0.44–1.00)
Creatinine, Ser: 5.52 mg/dL — ABNORMAL HIGH (ref 0.44–1.00)
Creatinine, Ser: 5.93 mg/dL — ABNORMAL HIGH (ref 0.44–1.00)
Creatinine, Ser: 6.47 mg/dL — ABNORMAL HIGH (ref 0.44–1.00)
GFR, Estimated: 10 mL/min — ABNORMAL LOW (ref >60)
GFR, Estimated: 9 mL/min — ABNORMAL LOW (ref >60)
GFR, Estimated: 9 mL/min — ABNORMAL LOW (ref >60)
GFR, Estimated: 8 mL/min — ABNORMAL LOW (ref >60)
GFR, Estimated: 7 mL/min — ABNORMAL LOW (ref >60)
Glucose, Bld: 159 mg/dL — ABNORMAL HIGH (ref 70–99)
Glucose, Bld: 136 mg/dL — ABNORMAL HIGH (ref 70–99)
Glucose, Bld: 118 mg/dL — ABNORMAL HIGH (ref 70–99)
Glucose, Bld: 130 mg/dL — ABNORMAL HIGH (ref 70–99)
Glucose, Bld: 114 mg/dL — ABNORMAL HIGH (ref 70–99)
Potassium: 5.1 mmol/L (ref 3.5–5.1)
Potassium: 5 mmol/L (ref 3.5–5.1)
Potassium: 4.6 mmol/L (ref 3.5–5.1)
Potassium: 4.2 mmol/L (ref 3.5–5.1)
Potassium: 4.3 mmol/L (ref 3.5–5.1)
Sodium: 136 mmol/L (ref 135–145)
Sodium: 137 mmol/L (ref 135–145)
Sodium: 136 mmol/L (ref 135–145)
Sodium: 137 mmol/L (ref 135–145)
Sodium: 137 mmol/L (ref 135–145)
Total Bilirubin: 0.7 mg/dL (ref <1.2)
Total Bilirubin: 0.4 mg/dL (ref <1.2)
Total Bilirubin: 0.2 mg/dL (ref <1.2)
Total Bilirubin: 0.6 mg/dL (ref <1.2)
Total Bilirubin: 0.6 mg/dL (ref <1.2)
Total Protein: 5.9 g/dL — ABNORMAL LOW (ref 6.5–8.1)
Total Protein: 6.2 g/dL — ABNORMAL LOW (ref 6.5–8.1)
Total Protein: 5.9 g/dL — ABNORMAL LOW (ref 6.5–8.1)
Total Protein: 6.6 g/dL (ref 6.5–8.1)
Total Protein: 7 g/dL (ref 6.5–8.1)

## 2023-08-26 LAB — POCT I-STAT 7, (LYTES, BLD GAS, ICA,H+H)
Acid-base deficit: 3 mmol/L — ABNORMAL HIGH (ref 0.0–2.0)
Acid-base deficit: 3 mmol/L — ABNORMAL HIGH (ref 0.0–2.0)
Acid-base deficit: 4 mmol/L — ABNORMAL HIGH (ref 0.0–2.0)
Acid-base deficit: 4 mmol/L — ABNORMAL HIGH (ref 0.0–2.0)
Bicarbonate: 20.9 mmol/L (ref 20.0–28.0)
Bicarbonate: 21.3 mmol/L (ref 20.0–28.0)
Bicarbonate: 21.5 mmol/L (ref 20.0–28.0)
Bicarbonate: 21.8 mmol/L (ref 20.0–28.0)
Calcium, Ion: 1.19 mmol/L (ref 1.15–1.40)
Calcium, Ion: 1.2 mmol/L (ref 1.15–1.40)
Calcium, Ion: 1.24 mmol/L (ref 1.15–1.40)
Calcium, Ion: 1.24 mmol/L (ref 1.15–1.40)
HCT: 31 % — ABNORMAL LOW (ref 36.0–46.0)
HCT: 31 % — ABNORMAL LOW (ref 36.0–46.0)
HCT: 32 % — ABNORMAL LOW (ref 36.0–46.0)
HCT: 33 % — ABNORMAL LOW (ref 36.0–46.0)
Hemoglobin: 10.5 g/dL — ABNORMAL LOW (ref 12.0–15.0)
Hemoglobin: 10.5 g/dL — ABNORMAL LOW (ref 12.0–15.0)
Hemoglobin: 10.9 g/dL — ABNORMAL LOW (ref 12.0–15.0)
Hemoglobin: 11.2 g/dL — ABNORMAL LOW (ref 12.0–15.0)
O2 Saturation: 92 %
O2 Saturation: 93 %
O2 Saturation: 94 %
O2 Saturation: 97 %
Patient temperature: 97.4
Patient temperature: 98.1
Patient temperature: 98.3
Patient temperature: 98.6
Potassium: 4.4 mmol/L (ref 3.5–5.1)
Potassium: 4.6 mmol/L (ref 3.5–5.1)
Potassium: 4.8 mmol/L (ref 3.5–5.1)
Potassium: 5 mmol/L (ref 3.5–5.1)
Sodium: 135 mmol/L (ref 135–145)
Sodium: 136 mmol/L (ref 135–145)
Sodium: 136 mmol/L (ref 135–145)
Sodium: 139 mmol/L (ref 135–145)
TCO2: 22 mmol/L (ref 22–32)
TCO2: 22 mmol/L (ref 22–32)
TCO2: 23 mmol/L (ref 22–32)
TCO2: 23 mmol/L (ref 22–32)
pCO2 arterial: 34.9 mm[Hg] (ref 32–48)
pCO2 arterial: 35.7 mm[Hg] (ref 32–48)
pCO2 arterial: 36.9 mm[Hg] (ref 32–48)
pCO2 arterial: 37.6 mm[Hg] (ref 32–48)
pH, Arterial: 7.368 (ref 7.35–7.45)
pH, Arterial: 7.372 (ref 7.35–7.45)
pH, Arterial: 7.376 (ref 7.35–7.45)
pH, Arterial: 7.394 (ref 7.35–7.45)
pO2, Arterial: 65 mm[Hg] — ABNORMAL LOW (ref 83–108)
pO2, Arterial: 67 mm[Hg] — ABNORMAL LOW (ref 83–108)
pO2, Arterial: 70 mm[Hg] — ABNORMAL LOW (ref 83–108)
pO2, Arterial: 87 mm[Hg] (ref 83–108)

## 2023-08-26 LAB — CBC
HCT: 33.1 % — ABNORMAL LOW (ref 36.0–46.0)
HCT: 32.4 % — ABNORMAL LOW (ref 36.0–46.0)
HCT: 30.6 % — ABNORMAL LOW (ref 36.0–46.0)
HCT: 32.5 % — ABNORMAL LOW (ref 36.0–46.0)
HCT: 33.5 % — ABNORMAL LOW (ref 36.0–46.0)
Hemoglobin: 11 g/dL — ABNORMAL LOW (ref 12.0–15.0)
Hemoglobin: 10.6 g/dL — ABNORMAL LOW (ref 12.0–15.0)
Hemoglobin: 10.1 g/dL — ABNORMAL LOW (ref 12.0–15.0)
Hemoglobin: 10.7 g/dL — ABNORMAL LOW (ref 12.0–15.0)
Hemoglobin: 10.8 g/dL — ABNORMAL LOW (ref 12.0–15.0)
MCH: 30.1 pg (ref 26.0–34.0)
MCH: 29.3 pg (ref 26.0–34.0)
MCH: 29.4 pg (ref 26.0–34.0)
MCH: 29.6 pg (ref 26.0–34.0)
MCH: 29.3 pg (ref 26.0–34.0)
MCHC: 33.2 g/dL (ref 30.0–36.0)
MCHC: 32.7 g/dL (ref 30.0–36.0)
MCHC: 33 g/dL (ref 30.0–36.0)
MCHC: 32.9 g/dL (ref 30.0–36.0)
MCHC: 32.2 g/dL (ref 30.0–36.0)
MCV: 90.4 fL (ref 80.0–100.0)
MCV: 89.5 fL (ref 80.0–100.0)
MCV: 89.2 fL (ref 80.0–100.0)
MCV: 89.8 fL (ref 80.0–100.0)
MCV: 91 fL (ref 80.0–100.0)
Platelets: 131 10*3/uL — ABNORMAL LOW (ref 150–400)
Platelets: 133 10*3/uL — ABNORMAL LOW (ref 150–400)
Platelets: 122 10*3/uL — ABNORMAL LOW (ref 150–400)
Platelets: 143 10*3/uL — ABNORMAL LOW (ref 150–400)
Platelets: 156 10*3/uL (ref 150–400)
RBC: 3.66 MIL/uL — ABNORMAL LOW (ref 3.87–5.11)
RBC: 3.62 MIL/uL — ABNORMAL LOW (ref 3.87–5.11)
RBC: 3.43 MIL/uL — ABNORMAL LOW (ref 3.87–5.11)
RBC: 3.62 MIL/uL — ABNORMAL LOW (ref 3.87–5.11)
RBC: 3.68 MIL/uL — ABNORMAL LOW (ref 3.87–5.11)
RDW: 16.8 % — ABNORMAL HIGH (ref 11.5–15.5)
RDW: 17.3 % — ABNORMAL HIGH (ref 11.5–15.5)
RDW: 17.3 % — ABNORMAL HIGH (ref 11.5–15.5)
RDW: 17.4 % — ABNORMAL HIGH (ref 11.5–15.5)
RDW: 17.4 % — ABNORMAL HIGH (ref 11.5–15.5)
WBC: 15 10*3/uL — ABNORMAL HIGH (ref 4.0–10.5)
WBC: 16.1 10*3/uL — ABNORMAL HIGH (ref 4.0–10.5)
WBC: 16.4 10*3/uL — ABNORMAL HIGH (ref 4.0–10.5)
WBC: 20.8 10*3/uL — ABNORMAL HIGH (ref 4.0–10.5)
WBC: 23.8 10*3/uL — ABNORMAL HIGH (ref 4.0–10.5)
nRBC: 0 % (ref 0.0–0.2)
nRBC: 0 % (ref 0.0–0.2)
nRBC: 0 % (ref 0.0–0.2)
nRBC: 0 % (ref 0.0–0.2)
nRBC: 0 % (ref 0.0–0.2)

## 2023-08-26 LAB — APTT
aPTT: 28 s (ref 24–36)
aPTT: 50 s — ABNORMAL HIGH (ref 24–36)
aPTT: 26 s (ref 24–36)
aPTT: 26 s (ref 24–36)

## 2023-08-26 LAB — BPAM FFP
Blood Product Expiration Date: 202411102359
Blood Product Expiration Date: 202411102359
ISSUE DATE / TIME: 202411051417
ISSUE DATE / TIME: 202411051417
Unit Type and Rh: 5100
Unit Type and Rh: 6200

## 2023-08-26 LAB — PREPARE FRESH FROZEN PLASMA: Unit division: 0

## 2023-08-26 LAB — MAGNESIUM
Magnesium: 1.8 mg/dL (ref 1.7–2.4)
Magnesium: 2 mg/dL (ref 1.7–2.4)
Magnesium: 2 mg/dL (ref 1.7–2.4)

## 2023-08-26 LAB — PROTIME-INR
INR: 1.2 (ref 0.8–1.2)
INR: 1.2 (ref 0.8–1.2)
INR: 1.2 (ref 0.8–1.2)
INR: 1.2 (ref 0.8–1.2)
Prothrombin Time: 14.9 s (ref 11.4–15.2)
Prothrombin Time: 15.3 s — ABNORMAL HIGH (ref 11.4–15.2)
Prothrombin Time: 15.3 s — ABNORMAL HIGH (ref 11.4–15.2)
Prothrombin Time: 15.1 s (ref 11.4–15.2)

## 2023-08-26 LAB — HEPATITIS B SURFACE ANTIGEN: Hepatitis B Surface Ag: NONREACTIVE

## 2023-08-26 LAB — FIBRINOGEN
Fibrinogen: 490 mg/dL — ABNORMAL HIGH (ref 210–475)
Fibrinogen: 500 mg/dL — ABNORMAL HIGH (ref 210–475)
Fibrinogen: 546 mg/dL — ABNORMAL HIGH (ref 210–475)
Fibrinogen: 573 mg/dL — ABNORMAL HIGH (ref 210–475)

## 2023-08-26 LAB — CG4 I-STAT (LACTIC ACID)
Lactic Acid, Venous: 0.8 mmol/L (ref 0.5–1.9)
Lactic Acid, Venous: 1 mmol/L (ref 0.5–1.9)
Lactic Acid, Venous: 1.1 mmol/L (ref 0.5–1.9)

## 2023-08-26 LAB — TRIGLYCERIDES: Triglycerides: 94 mg/dL

## 2023-08-26 MED ORDER — METHOCARBAMOL 1000 MG/10ML IJ SOLN
500.0000 mg | Freq: Four times a day (QID) | INTRAMUSCULAR | Status: DC | PRN
  Administered 2023-08-26 – 2023-09-01 (×3): 500 mg via INTRAVENOUS
  Filled 2023-08-26: qty 10
  Filled 2023-08-26: qty 5
  Filled 2023-08-26: qty 10
  Filled 2023-08-26: qty 5

## 2023-08-26 MED ORDER — DOXERCALCIFEROL 4 MCG/2ML IV SOLN
2.0000 ug | INTRAVENOUS | Status: DC
  Administered 2023-08-29 – 2023-09-01 (×2): 2 ug via INTRAVENOUS
  Filled 2023-08-26 (×6): qty 2

## 2023-08-26 MED ORDER — ENOXAPARIN SODIUM 40 MG/0.4ML IJ SOSY: 40.0000 mg | PREFILLED_SYRINGE | Freq: Every day | INTRAMUSCULAR | Status: DC

## 2023-08-26 MED ORDER — INSULIN ASPART 100 UNIT/ML IJ SOLN
0.0000 [IU] | INTRAMUSCULAR | Status: DC
  Administered 2023-08-26 (×2): 2 [IU] via SUBCUTANEOUS

## 2023-08-26 NOTE — Anesthesia Post-op Follow-up Note (Signed)
  Anesthesia Pain Follow-up Note  Patient: [name] [facility]  Day #: 1  Date of Follow-up: 08/26/2023 Time: 12:24 PM  Last Vitals:  Vitals:   08/26/23 1200 08/26/23 1215  BP:    Pulse: 64 70  Resp: 17 14  Temp:    SpO2: 98% 93%    Level of Consciousness: alert  Pain: none   Side Effects:None  Catheter Site Exam:clean, dry  Anti-Coag Meds (From admission, onward)    Start     Dose/Rate Route Frequency Ordered Stop   08/26/23 1000  heparin injection 5,000 Units        5,000 Units Subcutaneous Every 12 hours 08/25/23 1552          Plan:  - Lumbar drain in place. Area clean, dry, no tenderness or erythema. CSF clear. 5/5 bilateral motor strength without any weaknesses. Off pressors. Vascular plans to cap drain today.   Earl Lites P Alexi Dorminey

## 2023-08-26 NOTE — Progress Notes (Signed)
Patient ID: [name], female   DOB: Sep 20, 1966, 57 y.o.   MRN: [id number] [name] Progress Note   Assessment/ Plan:   1.  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  Weaned off of pressors and will undertake dialysis today with some ultrafiltration to help facilitate extubation. 2.  End-stage renal disease: Routinely on Monday/Wednesday/Friday dialysis schedule and has been dialyzed off schedule this week to accommodate for surgery.  She is hemodynamically stable for regular/conventional hemodialysis today and orders have been placed. 3.  Anemia of chronic disease: Likely compounded by perioperative blood loss, will monitor hemoglobin and hematocrit trend to decide on need for ESA versus PRBC transfusion. 4.  Secondary hyperparathyroidism: Will follow calcium and phosphorus levels and resume low phosphorus renal diet when able to take p.o. oral route.  Will restart Hectorol for PTH control with dialysis. 5.  HIV infection: Resume antiretroviral therapy when possible. 6.  Hypertension: Weaned off of pressors overnight and will undertake dialysis today with cautious ultrafiltration.  Subjective:   No acute events noted overnight, weaned off of pressors   Objective:   BP (!) 140/79 (BP Location: Right Arm)   Pulse (!) 59   Temp (!) 96.9 F (36.1 C) (Axillary)   Resp 17   Ht 5\' 5"  (1.651 m)   Wt 84.6 kg   LMP 03/31/2014 (LMP Unknown)   SpO2 97%   BMI 31.04 kg/m   Physical Exam: Gen: Intubated, awake/alert CVS: Pulse regular rhythm, normal rate, mediastinal drains in situ Resp: Anteriorly clear to auscultation, no rales/rhonchi-on ventilator.  Right IJ TDC Abd: Soft, obese, nontender, bowel sounds normal Ext: No lower extremity edema  Labs: BMET Recent Labs  Lab 08/23/23 1113 08/25/23 0559 08/25/23 0851  08/25/23 1148 08/25/23 1320 08/25/23 2325 08/26/23 0042 08/26/23 0507 08/26/23 0508  NA 136 140 140 139 137 136 139 136 137  K 5.4* 3.8 4.2 4.2 4.7 5.1 4.8 5.0 5.0  CL 97* 101  --   --   --  101  --   --  102  CO2 23  --   --   --   --  22  --   --  22  GLUCOSE 218* 107*  --   --   --  159*  --   --  136*  BUN 31* 31*  --   --   --  38*  --   --  43*  CREATININE 4.46* 4.40*  --   --   --  4.76*  --   --  5.15*  CALCIUM 6.8*  --   --   --   --  9.0  --   --  9.7   CBC Recent Labs  Lab 08/23/23 1113 08/25/23 0559 08/25/23 1511 08/25/23 2325 08/26/23 0042 08/26/23 0507 08/26/23 0508  WBC 7.1  --  15.4* 15.0*  --   --  16.1*  HGB 11.8*   < > 8.6* 11.0* 11.2* 10.9* 10.6*  HCT 37.8   < > 28.0* 33.1* 33.0* 32.0* 32.4*  MCV 93.3  --  95.6 90.4  --   --  89.5  PLT 221  --  179 131*  --   --  133*   < > = values in this interval not displayed.      Medications:     acetaminophen  1,000 mg Oral Q6H   Or  acetaminophen (TYLENOL) oral liquid 160 mg/5 mL  1,000 mg Per Tube Q6H   allopurinol  100 mg Oral Daily   aspirin EC  325 mg Oral Daily   Or   aspirin  324 mg Per Tube Daily   bisacodyl  10 mg Oral Daily   Or   bisacodyl  10 mg Rectal Daily   budesonide  0.25 mg Nebulization BID   Chlorhexidine Gluconate Cloth  6 each Topical Daily   docusate sodium  200 mg Oral Daily   dolutegravir  50 mg Oral Daily   emtricitabine-tenofovir AF  1 tablet Oral Daily   ferric citrate  210 mg Oral TID WC   heparin  5,000 Units Subcutaneous Q12H   levothyroxine  175 mcg Oral Daily   methadone  0.3 mg/kg (Ideal) Intravenous Once   metoCLOPramide (REGLAN) injection  10 mg Intravenous Q6H   metoprolol tartrate  12.5 mg Oral BID   Or   metoprolol tartrate  12.5 mg Per Tube BID   mouth rinse  15 mL Mouth Rinse Q2H   [START ON 08/27/2023] pantoprazole  40 mg Oral Daily   pantoprazole (PROTONIX) IV  40 mg Intravenous QHS   rosuvastatin  10 mg Oral Daily   sodium chloride flush  3 mL  Intravenous Q12H   Zetta Bills, MD 08/26/2023, 8:37 AM

## 2023-08-26 NOTE — Progress Notes (Signed)
[address]       [place] [address]             [phone]                 1 Day Post-Op Procedure(s) (LRB): AORTIC ARCH DEBRANCHING USING 12X8X8MM HEMASHIELD GRAFT (N/A) TRANSESOPHAGEAL ECHOCARDIOGRAM (N/A) THORACIC AORTIC ENDOVASCULAR STENT GRAFT (N/A) ULTRASOUND GUIDANCE FOR VASCULAR ACCESS, RIGHT FEMORAL ARTERY (Right)   Events: No events Following commands _______________________________________________________________ Vitals: BP (!) 140/79 (BP Location: Right Arm)   Pulse 61   Temp (!) 96.9 F (36.1 C) (Axillary)   Resp 16   Ht 5\' 5"  (1.651 m)   Wt 84.6 kg   LMP 03/31/2014 (LMP Unknown)   SpO2 98%   BMI 31.04 kg/m  Filed Weights   08/25/23 0553 08/26/23 0530  Weight: 78.5 kg 84.6 kg     - Neuro: alert NAD  - Cardiovascular: sinus  Drips: none.      - Pulm:  Vent Mode: PRVC;PSV;SIMV FiO2 (%):  [50 %-60 %] 50 % Set Rate:  [16 bmp] 16 bmp Vt Set:  [420 mL] 420 mL PEEP:  [5 cmH20] 5 cmH20 Pressure Support:  [10 cmH20] 10 cmH20 Plateau Pressure:  [10 cmH20-19 cmH20] 10 cmH20  ABG    Component Value Date/Time   PHART 7.372 08/26/2023 0507   PCO2ART 37.6 08/26/2023 0507   PO2ART 67 (L) 08/26/2023 0507   HCO3 21.8 08/26/2023 0507   TCO2 23 08/26/2023 0507   ACIDBASEDEF 3.0 (H) 08/26/2023 0507   O2SAT 93 08/26/2023 0507    - Abd: ND - Extremity: warm  .Intake/Output      11/08 0701 11/09 0700 11/09 0701 11/10 0700   I.V. (mL/kg) 3036.2 (35.9) 4.7 (0.1)   Blood 865    IV Piggyback 1557.9    Total Intake(mL/kg) 5459 (64.5) 4.7 (0.1)   Urine (mL/kg/hr) 323 (0.2) 35 (0.1)   Emesis/NG output 300    Drains 220 45   Other 25    Blood 800    Chest Tube 200 50   Total Output 1868 130   Net +3591 -125.3           _______________________________________________________________ Labs:    Latest Ref Rng & Units 08/26/2023    5:08 AM 08/26/2023    5:07 AM 08/26/2023   12:42 AM  CBC  WBC 4.0 - 10.5 K/uL 16.1      Hemoglobin 12.0 - 15.0 g/dL 52.8  41.3  24.4   Hematocrit 36.0 - 46.0 % 32.4  32.0  33.0   Platelets 150 - 400 K/uL 133         Latest Ref Rng & Units 08/26/2023    5:08 AM 08/26/2023    5:07 AM 08/26/2023   12:42 AM  CMP  Glucose 70 - 99 mg/dL 010     BUN 6 - 20 mg/dL 43     Creatinine 2.72 - 1.00 mg/dL 5.36     Sodium 644 - 034 mmol/L 137  136  139   Potassium 3.5 - 5.1 mmol/L 5.0  5.0  4.8   Chloride 98 - 111 mmol/L 102     CO2 22 - 32 mmol/L 22     Calcium 8.9 - 10.3 mg/dL 9.7     Total Protein 6.5 - 8.1 g/dL 6.2     Total Bilirubin <1.2 mg/dL 0.4     Alkaline Phos 38 - 126 U/L 83     AST  15 - 41 U/L 16     ALT 0 - 44 U/L 8       CXR: PV congestion  _______________________________________________________________  Assessment and Plan: POD 1 s/p aortic arch debranching, and TEVAR  Neuro: on minimal sedation.  Will cap spinal drain CV: will remove 1 a-line Pulm: wean to extubate Renal: creat up GI: NPO for now Heme: stable ID: afebrile Endo: SSI Dispo: continue ICU care.   Corliss Skains 08/26/2023 10:46 AM

## 2023-08-26 NOTE — Progress Notes (Addendum)
  Daily Progress Note  S/p: Aortic arch deep branching with TEVAR from zone 0 to zone 6  Subjective: Intubated this morning but off sedation and responding to commands.  Lifts knees off bed without any issue.  Weaned off pressors overnight  Objective: Vitals:   08/26/23 0930 08/26/23 0945  BP:    Pulse: (!) 59 61  Resp: 15 14  Temp:    SpO2: 98% 98%    Physical Examination Responding to commands Hemodynamically stable Intubated on vent Right groin access site without hematoma or drainage Palpable DPs bilaterally Lifts legs off bed without any issue  ASSESSMENT/PLAN:  [name] is a 57 year old female who underwent aortic arch deep branching and TEVAR from zone 0 to zone 6 with prophylactic lumbar drain placement.  Overall her she is recovering well and has no signs of spinal cord ischemia.  Neuro: She is responding to commands and off sedation CV: She is hemodynamically stable and off pressors, MAP goal of 80 Pulm: Intubated, deferred to critical care for management GI: N.p.o. GU: ESRD, critical care plans for HD today MSK: Lumbar drain in place, no signs of spinal cord ischemia.  Okay to clamp drain today if extubation planned and continue to reassess lower extremity motor with knee lift every hour Heme ID: No concerns for infection, hemoglobin 10.6, hemoglobin goal of 9  Daria Pastures MD [facility] [phone] 08/26/2023  10:35 AM

## 2023-08-26 NOTE — Procedures (Signed)
Extubation Procedure Note  Patient Details:   Name: [name] DOB: 12-09-1965 MRN: [id number]   Airway Documentation:    Vent end date: 08/26/23 Vent end time: 1215   Evaluation  O2 sats: stable throughout Complications: No apparent complications Patient did tolerate procedure well. Bilateral Breath Sounds: Clear, Diminished   Yes, pt could speak post extubation.  Pt extubated to 4 l/m Kieler without difficulty.  [name] 08/26/2023, 12:15 PM

## 2023-08-26 NOTE — Addendum Note (Signed)
Addendum  created 08/26/23 1234 by Atilano Median, DO   Clinical Note Signed

## 2023-08-27 ENCOUNTER — Inpatient Hospital Stay (HOSPITAL_COMMUNITY): Payer: Medicare HMO

## 2023-08-27 LAB — COMPREHENSIVE METABOLIC PANEL
ALT: 6 U/L (ref 0–44)
ALT: 5 U/L (ref 0–44)
AST: 15 U/L (ref 15–41)
AST: 19 U/L (ref 15–41)
Albumin: 2.7 g/dL — ABNORMAL LOW (ref 3.5–5.0)
Albumin: 2.9 g/dL — ABNORMAL LOW (ref 3.5–5.0)
Alkaline Phosphatase: 77 U/L (ref 38–126)
Alkaline Phosphatase: 91 U/L (ref 38–126)
Anion gap: 18 — ABNORMAL HIGH (ref 5–15)
Anion gap: 15 (ref 5–15)
BUN: 59 mg/dL — ABNORMAL HIGH (ref 6–20)
BUN: 28 mg/dL — ABNORMAL HIGH (ref 6–20)
CO2: 18 mmol/L — ABNORMAL LOW (ref 22–32)
CO2: 22 mmol/L (ref 22–32)
Calcium: 8.8 mg/dL — ABNORMAL LOW (ref 8.9–10.3)
Calcium: 8.6 mg/dL — ABNORMAL LOW (ref 8.9–10.3)
Chloride: 101 mmol/L (ref 98–111)
Chloride: 98 mmol/L (ref 98–111)
Creatinine, Ser: 6.81 mg/dL — ABNORMAL HIGH (ref 0.44–1.00)
Creatinine, Ser: 4.03 mg/dL — ABNORMAL HIGH (ref 0.44–1.00)
GFR, Estimated: 7 mL/min — ABNORMAL LOW (ref >60)
GFR, Estimated: 12 mL/min — ABNORMAL LOW (ref >60)
Glucose, Bld: 112 mg/dL — ABNORMAL HIGH (ref 70–99)
Glucose, Bld: 107 mg/dL — ABNORMAL HIGH (ref 70–99)
Potassium: 4.6 mmol/L (ref 3.5–5.1)
Potassium: 3.8 mmol/L (ref 3.5–5.1)
Sodium: 137 mmol/L (ref 135–145)
Sodium: 135 mmol/L (ref 135–145)
Total Bilirubin: 0.6 mg/dL (ref <1.2)
Total Bilirubin: 1 mg/dL (ref <1.2)
Total Protein: 7 g/dL (ref 6.5–8.1)
Total Protein: 7.1 g/dL (ref 6.5–8.1)

## 2023-08-27 LAB — CBC
HCT: 33.6 % — ABNORMAL LOW (ref 36.0–46.0)
HCT: 34.1 % — ABNORMAL LOW (ref 36.0–46.0)
Hemoglobin: 10.7 g/dL — ABNORMAL LOW (ref 12.0–15.0)
Hemoglobin: 11.1 g/dL — ABNORMAL LOW (ref 12.0–15.0)
MCH: 29.5 pg (ref 26.0–34.0)
MCH: 29.8 pg (ref 26.0–34.0)
MCHC: 31.8 g/dL (ref 30.0–36.0)
MCHC: 32.6 g/dL (ref 30.0–36.0)
MCV: 92.6 fL (ref 80.0–100.0)
MCV: 91.7 fL (ref 80.0–100.0)
Platelets: 154 10*3/uL (ref 150–400)
Platelets: 155 10*3/uL (ref 150–400)
RBC: 3.63 MIL/uL — ABNORMAL LOW (ref 3.87–5.11)
RBC: 3.72 MIL/uL — ABNORMAL LOW (ref 3.87–5.11)
RDW: 17.5 % — ABNORMAL HIGH (ref 11.5–15.5)
RDW: 17.4 % — ABNORMAL HIGH (ref 11.5–15.5)
WBC: 24 10*3/uL — ABNORMAL HIGH (ref 4.0–10.5)
WBC: 25 10*3/uL — ABNORMAL HIGH (ref 4.0–10.5)
nRBC: 0 % (ref 0.0–0.2)
nRBC: 0 % (ref 0.0–0.2)

## 2023-08-27 LAB — HEPATITIS B SURFACE ANTIBODY, QUANTITATIVE: Hep B S AB Quant (Post): 90.9 m[IU]/mL

## 2023-08-27 LAB — GLUCOSE, CAPILLARY
Glucose-Capillary: 86 mg/dL (ref 70–99)
Glucose-Capillary: 93 mg/dL (ref 70–99)
Glucose-Capillary: 94 mg/dL (ref 70–99)
Glucose-Capillary: 96 mg/dL (ref 70–99)
Glucose-Capillary: 80 mg/dL (ref 70–99)

## 2023-08-27 LAB — FIBRINOGEN
Fibrinogen: 634 mg/dL — ABNORMAL HIGH (ref 210–475)
Fibrinogen: 723 mg/dL — ABNORMAL HIGH (ref 210–475)
Fibrinogen: 736 mg/dL — ABNORMAL HIGH (ref 210–475)

## 2023-08-27 LAB — PROTIME-INR
INR: 1.2 (ref 0.8–1.2)
INR: 1.2 (ref 0.8–1.2)
INR: 1.3 — ABNORMAL HIGH (ref 0.8–1.2)
Prothrombin Time: 15.4 s — ABNORMAL HIGH (ref 11.4–15.2)
Prothrombin Time: 15 s (ref 11.4–15.2)
Prothrombin Time: 16.1 s — ABNORMAL HIGH (ref 11.4–15.2)

## 2023-08-27 LAB — APTT
aPTT: 27 s (ref 24–36)
aPTT: 32 s (ref 24–36)
aPTT: 32 s (ref 24–36)

## 2023-08-27 MED ORDER — ORAL CARE MOUTH RINSE: 15.0000 mL | OROMUCOSAL | Status: DC | PRN

## 2023-08-27 MED ORDER — HEPARIN SODIUM (PORCINE) 1000 UNIT/ML IJ SOLN
INTRAMUSCULAR | Status: AC
  Administered 2023-08-27: 1000 [IU]
  Filled 2023-08-27: qty 1

## 2023-08-27 MED ORDER — HEPARIN SODIUM (PORCINE) 5000 UNIT/ML IJ SOLN
5000.0000 [IU] | Freq: Three times a day (TID) | INTRAMUSCULAR | Status: DC
  Administered 2023-08-27 – 2023-08-29 (×6): 5000 [IU] via SUBCUTANEOUS
  Filled 2023-08-27 (×6): qty 1

## 2023-08-27 MED ORDER — CHLORHEXIDINE GLUCONATE 0.12 % MT SOLN
15.0000 mL | Freq: Once | OROMUCOSAL | Status: AC
Start: 2023-08-27 — End: 2023-08-27
  Administered 2023-08-27: 15 mL via OROMUCOSAL

## 2023-08-27 NOTE — Progress Notes (Signed)
EVENING ROUNDS NOTE :     [address]       [place] [address]             [phone]                 2 Days Post-Op Procedure(s) (LRB): AORTIC ARCH DEBRANCHING USING 12X8X8MM HEMASHIELD GRAFT (N/A) TRANSESOPHAGEAL ECHOCARDIOGRAM (N/A) THORACIC AORTIC ENDOVASCULAR STENT GRAFT (N/A) ULTRASOUND GUIDANCE FOR VASCULAR ACCESS, RIGHT FEMORAL ARTERY (Right)   Total Length of Stay:  LOS: 2 days  Events:   No events    BP 132/65 (BP Location: Left Arm)   Pulse 77   Temp 98.7 F (37.1 C) (Axillary)   Resp 12   Ht 5\' 5"  (1.651 m)   Wt 84.6 kg   LMP 03/31/2014 (LMP Unknown)   SpO2 95%   BMI 31.04 kg/m      FiO2 (%):  [36 %] 36 %   albumin human     norepinephrine (LEVOPHED) Adult infusion 2 mcg/min (08/27/23 1700)    I/O last 3 completed shifts: In: 394.8 [I.V.:294.8; IV Piggyback:100] Out: 1790 [Urine:270; Drains:95; Other:1000; Chest Tube:425]      Latest Ref Rng & Units 08/27/2023   10:00 AM 08/27/2023    2:49 AM 08/26/2023   10:07 PM  CBC  WBC 4.0 - 10.5 K/uL 25.0  24.0  23.8   Hemoglobin 12.0 - 15.0 g/dL 27.2  53.6  64.4   Hematocrit 36.0 - 46.0 % 34.1  33.6  33.5   Platelets 150 - 400 K/uL 155  154  156        Latest Ref Rng & Units 08/27/2023   10:00 AM 08/27/2023    2:49 AM 08/26/2023   10:07 PM  BMP  Glucose 70 - 99 mg/dL 034  742  595   BUN 6 - 20 mg/dL 28  59  55   Creatinine 0.44 - 1.00 mg/dL 6.38  7.56  4.33   Sodium 135 - 145 mmol/L 135  137  137   Potassium 3.5 - 5.1 mmol/L 3.8  4.6  4.3   Chloride 98 - 111 mmol/L 98  101  103   CO2 22 - 32 mmol/L 22  18  18    Calcium 8.9 - 10.3 mg/dL 8.6  8.8  8.9     ABG    Component Value Date/Time   PHART 7.376 08/26/2023 1412   PCO2ART 35.7 08/26/2023 1412   PO2ART 65 (L) 08/26/2023 1412   HCO3 20.9 08/26/2023 1412   TCO2 22 08/26/2023 1412   ACIDBASEDEF 4.0 (H) 08/26/2023 1412   O2SAT 92 08/26/2023 1412       Brynda Greathouse, MD 08/27/2023 7:45 PM

## 2023-08-27 NOTE — Progress Notes (Signed)
  Daily Progress Note  S/p: Aortic arch deep branching with TEVAR from zone 0 to zone 6  Subjective: Drain was clamped yesterday afternoon and motor exam was stable with strong leg lift Extubated yesterday evening, pain better controlled this morning.  Was on Levophed during dialysis to maintain a MAP of 80 this morning.  Moving legs without any issue.    Objective: Vitals:   08/27/23 0830 08/27/23 0845  BP:    Pulse: 75 72  Resp: 12 12  Temp:    SpO2: 95% 96%    Physical Examination Responding to commands Hemodynamically stable On 4 L nasal cannula Right groin access site without hematoma or drainage Palpable DPs bilaterally Lifts legs off bed without any issue  ASSESSMENT/PLAN:  [name] is a 57 year old female who underwent aortic arch deep branching and TEVAR from zone 0 to zone 6 with prophylactic lumbar drain placement.  Overall her she is recovering well and has no signs of spinal cord ischemia.  -Plan to remove the lumbar drain today  Neuro: She is responding to commands, no focal deficit. CV: She is hemodynamically stable, MAP goal of 80.  On Levophed since dialysis this morning Pulm: Extubated, on 4 L nasal cannula GI: N.p.o. GU: ESRD, underwent HD this morning MSK: Clamped number drain yesterday with no signs of spinal cord ischemia.  Plan to remove drain today Heme ID: No concerns for infection, hemoglobin 10.7, hemoglobin goal of 9  Daria Pastures MD [facility] [phone] 08/27/2023  10:21 AM

## 2023-08-27 NOTE — Progress Notes (Signed)
[address]       [place] [address]             [phone]                 2 Days Post-Op Procedure(s) (LRB): AORTIC ARCH DEBRANCHING USING 12X8X8MM HEMASHIELD GRAFT (N/A) TRANSESOPHAGEAL ECHOCARDIOGRAM (N/A) THORACIC AORTIC ENDOVASCULAR STENT GRAFT (N/A) ULTRASOUND GUIDANCE FOR VASCULAR ACCESS, RIGHT FEMORAL ARTERY (Right)   Events: No events 1L removed with dialysis _______________________________________________________________ Vitals: BP 132/65 (BP Location: Left Arm)   Pulse 72   Temp 97.6 F (36.4 C) (Oral)   Resp 12   Ht 5\' 5"  (1.651 m)   Wt 84.6 kg   LMP 03/31/2014 (LMP Unknown)   SpO2 96%   BMI 31.04 kg/m  Filed Weights   08/25/23 0553 08/26/23 0530 08/27/23 0705  Weight: 78.5 kg 84.6 kg 84.6 kg     - Neuro: alert NAD  - Cardiovascular: sinus  Drips: levo  7    - Pulm:  Vent Mode: PSV;CPAP FiO2 (%):  [36 %-50 %] 36 % Set Rate:  [4 bmp-16 bmp] 4 bmp Vt Set:  [420 mL] 420 mL PEEP:  [5 cmH20] 5 cmH20 Pressure Support:  [10 cmH20] 10 cmH20 Plateau Pressure:  [10 cmH20] 10 cmH20  ABG    Component Value Date/Time   PHART 7.376 08/26/2023 1412   PCO2ART 35.7 08/26/2023 1412   PO2ART 65 (L) 08/26/2023 1412   HCO3 20.9 08/26/2023 1412   TCO2 22 08/26/2023 1412   ACIDBASEDEF 4.0 (H) 08/26/2023 1412   O2SAT 92 08/26/2023 1412    - Abd: ND - Extremity: warm  .Intake/Output      11/09 0701 11/10 0700 11/10 0701 11/11 0700   I.V. (mL/kg) 76.5 (0.9) 26.3 (0.3)   Blood     IV Piggyback 100    Total Intake(mL/kg) 176.5 (2.1) 26.3 (0.3)   Urine (mL/kg/hr) 200 (0.1)    Emesis/NG output     Drains 95    Other  1000   Blood     Chest Tube 300    Total Output 595 1000   Net -418.5 -973.7           _______________________________________________________________ Labs:    Latest Ref Rng & Units 08/27/2023    2:49 AM 08/26/2023   10:07 PM 08/26/2023    5:17 PM  CBC  WBC 4.0 - 10.5 K/uL 24.0  23.8  20.8   Hemoglobin  12.0 - 15.0 g/dL 96.7  89.3  81.0   Hematocrit 36.0 - 46.0 % 33.6  33.5  32.5   Platelets 150 - 400 K/uL 154  156  143       Latest Ref Rng & Units 08/27/2023    2:49 AM 08/26/2023   10:07 PM 08/26/2023    5:17 PM  CMP  Glucose 70 - 99 mg/dL 175  102  585   BUN 6 - 20 mg/dL 59  55  51   Creatinine 0.44 - 1.00 mg/dL 2.77  8.24  2.35   Sodium 135 - 145 mmol/L 137  137  137   Potassium 3.5 - 5.1 mmol/L 4.6  4.3  4.2   Chloride 98 - 111 mmol/L 101  103  104   CO2 22 - 32 mmol/L 18  18  19    Calcium 8.9 - 10.3 mg/dL 8.8  8.9  8.8   Total Protein 6.5 - 8.1 g/dL 7.0  7.0  6.6  Total Bilirubin <1.2 mg/dL 0.6  0.6  0.6   Alkaline Phos 38 - 126 U/L 77  84  78   AST 15 - 41 U/L 15  17  16    ALT 0 - 44 U/L 6  <5  <5     CXR: -  _______________________________________________________________  Assessment and Plan: POD 2 s/p aortic arch debranching, and TEVAR  Neuro: nonfocal.  Spinal drain will be remove today CV: keeping MAPS up for spinal perfusion.  Pulm: pulm hyg Renal: continue dialysis GI: on diet Heme: stable ID: afebrile Endo: SSI Dispo: continue ICU care.   Corliss Skains 08/27/2023 9:56 AM

## 2023-08-27 NOTE — Procedures (Signed)
Patient seen on Hemodialysis. BP 106/64   Pulse 83   Temp (!) 96.8 F (36 C) (Axillary)   Resp 14   Ht 5\' 5"  (1.651 m)   Wt 84.6 kg   LMP 03/31/2014 (LMP Unknown)   SpO2 97%   BMI 31.04 kg/m   QB 300, UF goal 1L Tolerating treatment without complaints at this time.   Zetta Bills MD [facility]. Office # [phone] Pager # [phone] 8:12 AM

## 2023-08-27 NOTE — Progress Notes (Signed)
Patient ID: [name], female   DOB: 1965/12/20, 57 y.o.   MRN: [id number] [facility] Progress Note   Assessment/ Plan:   1.  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  Extubated yesterday and weaned off pressors. 2.  End-stage renal disease: Routinely on Monday/Wednesday/Friday dialysis schedule and has been dialyzed off schedule this week to accommodate for surgery.  Undergoing hemodialysis today after delayed from yesterday.  Will undertake next hemodialysis on Tuesday (off schedule). 3.  Anemia of chronic disease: Likely compounded by perioperative blood loss, will monitor hemoglobin and hematocrit trend to decide on need for ESA versus PRBC transfusion. 4.  Secondary hyperparathyroidism: Will follow calcium and phosphorus levels and resume low phosphorus renal diet when able to take p.o. oral route.  Will restart Hectorol for PTH control with dialysis. 5.  HIV infection: Antiretroviral therapy resumed. 6.  Hypertension: Weaned off of pressors overnight and will undertake dialysis today with cautious ultrafiltration.  Subjective:   Extubated yesterday, delayed dialysis-getting dialysis this morning when seen.  Ultrafiltration limited to 1 L.   Objective:   BP 106/64   Pulse 83   Temp (!) 96.8 F (36 C) (Axillary)   Resp 14   Ht 5\' 5"  (1.651 m)   Wt 84.6 kg   LMP 03/31/2014 (LMP Unknown)   SpO2 97%   BMI 31.04 kg/m   Physical Exam: Gen: Awake/alert and laying still for dialysis.  Mouthing responses to questions CVS: Pulse regular rhythm, normal rate, mediastinal drains in situ Resp: Anteriorly clear to auscultation, no rales/rhonchi-on ventilator.  Right IJ TDC connected to dialysis Abd: Soft, obese, nontender, bowel sounds normal Ext: No lower extremity edema  Labs: BMET Recent Labs  Lab 08/23/23 1113  08/25/23 0559 08/25/23 0851 08/25/23 2325 08/26/23 0042 08/26/23 0508 08/26/23 1134 08/26/23 1210 08/26/23 1412 08/26/23 1717 08/26/23 2207 08/27/23 0249  NA 136 140   < > 136   < > 137 136 135 136 137 137 137  K 5.4* 3.8   < > 5.1   < > 5.0 4.6 4.6 4.4 4.2 4.3 4.6  CL 97* 101  --  101  --  102 102  --   --  104 103 101  CO2 23  --   --  22  --  22 21*  --   --  19* 18* 18*  GLUCOSE 218* 107*  --  159*  --  136* 118*  --   --  130* 114* 112*  BUN 31* 31*  --  38*  --  43* 48*  --   --  51* 55* 59*  CREATININE 4.46* 4.40*  --  4.76*  --  5.15* 5.52*  --   --  5.93* 6.47* 6.81*  CALCIUM 6.8*  --   --  9.0  --  9.7 9.1  --   --  8.8* 8.9 8.8*   < > = values in this interval not displayed.   CBC Recent Labs  Lab 08/26/23 1134 08/26/23 1210 08/26/23 1412 08/26/23 1717 08/26/23 2207 08/27/23 0249  WBC 16.4*  --   --  20.8* 23.8* 24.0*  HGB 10.1*   < > 10.5* 10.7* 10.8* 10.7*  HCT 30.6*   < > 31.0* 32.5* 33.5* 33.6*  MCV 89.2  --   --  89.8 91.0 92.6  PLT 122*  --   --  143* 156  154   < > = values in this interval not displayed.      Medications:     acetaminophen  1,000 mg Oral Q6H   Or   acetaminophen (TYLENOL) oral liquid 160 mg/5 mL  1,000 mg Per Tube Q6H   allopurinol  100 mg Oral Daily   aspirin EC  325 mg Oral Daily   Or   aspirin  324 mg Per Tube Daily   bisacodyl  10 mg Oral Daily   Or   bisacodyl  10 mg Rectal Daily   budesonide  0.25 mg Nebulization BID   Chlorhexidine Gluconate Cloth  6 each Topical Daily   docusate sodium  200 mg Oral Daily   dolutegravir  50 mg Oral Daily   doxercalciferol  2 mcg Intravenous Q T,Th,Sa-HD   emtricitabine-tenofovir AF  1 tablet Oral Daily   ferric citrate  210 mg Oral TID WC   heparin  5,000 Units Subcutaneous Q12H   insulin aspart  0-24 Units Subcutaneous Q4H   levothyroxine  175 mcg Oral Daily   methadone  0.3 mg/kg (Ideal) Intravenous Once   metoprolol tartrate  12.5 mg Oral BID   Or   metoprolol tartrate  12.5  mg Per Tube BID   pantoprazole  40 mg Oral Daily   rosuvastatin  10 mg Oral Daily   sodium chloride flush  3 mL Intravenous Q12H   Zetta Bills, MD 08/27/2023, 8:09 AM

## 2023-08-28 ENCOUNTER — Encounter (HOSPITAL_COMMUNITY): Payer: Self-pay | Admitting: Surgery

## 2023-08-28 LAB — FIBRINOGEN
Fibrinogen: >800 mg/dL — ABNORMAL HIGH (ref 210–475)
Fibrinogen: >800 mg/dL — ABNORMAL HIGH (ref 210–475)
Fibrinogen: >800 mg/dL — ABNORMAL HIGH (ref 210–475)

## 2023-08-28 LAB — POCT I-STAT 7, (LYTES, BLD GAS, ICA,H+H)
Acid-base deficit: 4 mmol/L — ABNORMAL HIGH (ref 0.0–2.0)
Bicarbonate: 21.5 mmol/L (ref 20.0–28.0)
Calcium, Ion: 1.3 mmol/L (ref 1.15–1.40)
HCT: 27 % — ABNORMAL LOW (ref 36.0–46.0)
Hemoglobin: 9.2 g/dL — ABNORMAL LOW (ref 12.0–15.0)
O2 Saturation: 91 %
Patient temperature: 36.6
Potassium: 4.4 mmol/L (ref 3.5–5.1)
Sodium: 138 mmol/L (ref 135–145)
TCO2: 23 mmol/L (ref 22–32)
pCO2 arterial: 40.9 mm[Hg] (ref 32–48)
pH, Arterial: 7.327 — ABNORMAL LOW (ref 7.35–7.45)
pO2, Arterial: 65 mm[Hg] — ABNORMAL LOW (ref 83–108)

## 2023-08-28 LAB — CBC WITH DIFFERENTIAL/PLATELET
Abs Immature Granulocytes: 0.17 10*3/uL — ABNORMAL HIGH (ref 0.00–0.07)
Basophils Absolute: 0 10*3/uL (ref 0.0–0.1)
Basophils Relative: 0 %
Eosinophils Absolute: 0 10*3/uL (ref 0.0–0.5)
Eosinophils Relative: 0 %
HCT: 30.9 % — ABNORMAL LOW (ref 36.0–46.0)
Hemoglobin: 10.1 g/dL — ABNORMAL LOW (ref 12.0–15.0)
Immature Granulocytes: 1 %
Lymphocytes Relative: 6 %
Lymphs Abs: 1 10*3/uL (ref 0.7–4.0)
MCH: 29.9 pg (ref 26.0–34.0)
MCHC: 32.7 g/dL (ref 30.0–36.0)
MCV: 91.4 fL (ref 80.0–100.0)
Monocytes Absolute: 1.2 10*3/uL — ABNORMAL HIGH (ref 0.1–1.0)
Monocytes Relative: 8 %
Neutro Abs: 13.8 10*3/uL — ABNORMAL HIGH (ref 1.7–7.7)
Neutrophils Relative %: 85 %
Platelets: 129 10*3/uL — ABNORMAL LOW (ref 150–400)
RBC: 3.38 MIL/uL — ABNORMAL LOW (ref 3.87–5.11)
RDW: 17.1 % — ABNORMAL HIGH (ref 11.5–15.5)
WBC: 16.2 10*3/uL — ABNORMAL HIGH (ref 4.0–10.5)
nRBC: 0 % (ref 0.0–0.2)

## 2023-08-28 LAB — COMPREHENSIVE METABOLIC PANEL
ALT: <5 U/L (ref 0–44)
AST: 19 U/L (ref 15–41)
Albumin: 2.4 g/dL — ABNORMAL LOW (ref 3.5–5.0)
Alkaline Phosphatase: 86 U/L (ref 38–126)
Anion gap: 16 — ABNORMAL HIGH (ref 5–15)
BUN: 42 mg/dL — ABNORMAL HIGH (ref 6–20)
CO2: 21 mmol/L — ABNORMAL LOW (ref 22–32)
Calcium: 8.5 mg/dL — ABNORMAL LOW (ref 8.9–10.3)
Chloride: 97 mmol/L — ABNORMAL LOW (ref 98–111)
Creatinine, Ser: 5.56 mg/dL — ABNORMAL HIGH (ref 0.44–1.00)
GFR, Estimated: 8 mL/min — ABNORMAL LOW (ref >60)
Glucose, Bld: 97 mg/dL (ref 70–99)
Potassium: 4.3 mmol/L (ref 3.5–5.1)
Sodium: 134 mmol/L — ABNORMAL LOW (ref 135–145)
Total Bilirubin: 0.7 mg/dL (ref <1.2)
Total Protein: 6.6 g/dL (ref 6.5–8.1)

## 2023-08-28 LAB — APTT
aPTT: 35 s (ref 24–36)
aPTT: 39 s — ABNORMAL HIGH (ref 24–36)
aPTT: 33 s (ref 24–36)

## 2023-08-28 LAB — GLUCOSE, CAPILLARY
Glucose-Capillary: 148 mg/dL — ABNORMAL HIGH (ref 70–99)
Glucose-Capillary: 81 mg/dL (ref 70–99)
Glucose-Capillary: 113 mg/dL — ABNORMAL HIGH (ref 70–99)
Glucose-Capillary: 111 mg/dL — ABNORMAL HIGH (ref 70–99)

## 2023-08-28 LAB — PROTIME-INR
INR: 1.3 — ABNORMAL HIGH (ref 0.8–1.2)
INR: 1.3 — ABNORMAL HIGH (ref 0.8–1.2)
INR: 1.4 — ABNORMAL HIGH (ref 0.8–1.2)
Prothrombin Time: 16.6 s — ABNORMAL HIGH (ref 11.4–15.2)
Prothrombin Time: 16.8 s — ABNORMAL HIGH (ref 11.4–15.2)
Prothrombin Time: 17.3 s — ABNORMAL HIGH (ref 11.4–15.2)

## 2023-08-28 LAB — PHOSPHORUS: Phosphorus: 6.9 mg/dL — ABNORMAL HIGH (ref 2.5–4.6)

## 2023-08-28 MED ORDER — INSULIN ASPART 100 UNIT/ML IJ SOLN
0.0000 [IU] | Freq: Three times a day (TID) | INTRAMUSCULAR | Status: DC
  Administered 2023-09-02: 3 [IU] via SUBCUTANEOUS

## 2023-08-28 MED ORDER — CHLORHEXIDINE GLUCONATE CLOTH 2 % EX PADS
6.0000 | MEDICATED_PAD | Freq: Every day | CUTANEOUS | Status: DC
  Administered 2023-08-28 – 2023-08-31 (×3): 6 via TOPICAL

## 2023-08-28 NOTE — Progress Notes (Signed)
      [address]       [place] [address]             [phone]      POD # 3 Arch debranching  Sitting up in chair  BP 118/73 (BP Location: Left Arm)   Pulse 85   Temp 98 F (36.7 C) (Oral)   Resp 13   Ht 5\' 5"  (1.651 m)   Wt 83.8 kg   LMP 03/31/2014 (LMP Unknown)   SpO2 100%   BMI 30.74 kg/m   5L [place] 100 % sat   Intake/Output Summary (Last 24 hours) at 08/28/2023 1746 Last data filed at 08/28/2023 1430 Gross per 24 hour  Intake 24.42 ml  Output 240 ml  Net -215.58 ml   CBG well controlled  Continue current Rx  Bronislaus Verdell C. Dorris Fetch, MD [facility] [phone]

## 2023-08-28 NOTE — Progress Notes (Signed)
  Daily Progress Note  S/p: Aortic arch deep branching with TEVAR from zone 0 to zone 6  Subjective: Feels good. Smiling. Appreciative  Objective: Vitals:   08/28/23 0700 08/28/23 0737  BP:    Pulse: 81 80  Resp: 13 13  Temp:  98.7 F (37.1 C)  SpO2: 94% 95%    Physical Examination  Hemodynamically stable Newcomb - nonlabored breathing  Right groin access site without hematoma or drainage Palpable DPs bilaterally Lifts legs off bed without any issue  ASSESSMENT/PLAN:  [name] is a 57 year old female who underwent aortic arch deep branching and TEVAR from zone 0 to zone 6 with prophylactic lumbar drain placement.  Overall her she is recovering well and has no signs of spinal cord ischemia. OOB PT/OT, ASA Care pathway per CV surgery.  Will continue to follow.    Victorino Sparrow MD [facility] [phone] 08/28/2023  7:56 AM

## 2023-08-28 NOTE — Plan of Care (Signed)
  Problem: Clinical Measurements: Goal: Postoperative complications will be avoided or minimized Outcome: Progressing   Problem: Education: Goal: Knowledge of General Education information will improve Description: Including pain rating scale, medication(s)/side effects and non-pharmacologic comfort measures Outcome: Progressing   Problem: Health Behavior/Discharge Planning: Goal: Ability to manage health-related needs will improve Outcome: Progressing   Problem: Clinical Measurements: Goal: Ability to maintain clinical measurements within normal limits will improve Outcome: Progressing Goal: Diagnostic test results will improve Outcome: Progressing Goal: Respiratory complications will improve Outcome: Progressing   Problem: Activity: Goal: Risk for activity intolerance will decrease Outcome: Progressing   Problem: Nutrition: Goal: Adequate nutrition will be maintained Outcome: Progressing   Problem: Coping: Goal: Level of anxiety will decrease Outcome: Progressing   Problem: Pain Management: Goal: General experience of comfort will improve Outcome: Progressing   Problem: Safety: Goal: Ability to remain free from injury will improve Outcome: Progressing   Problem: Skin Integrity: Goal: Risk for impaired skin integrity will decrease Outcome: Progressing

## 2023-08-28 NOTE — Progress Notes (Signed)
[facility] Progress Note  Subjective: seen in room, no c/o's today. Feeling much better.   Vitals:   08/28/23 0900 08/28/23 0909 08/28/23 0925 08/28/23 1000  BP: (!) 153/71 (!) 157/72  130/85  Pulse: 88 84 91 82  Resp: 17 (!) 21 17 15   Temp:      TempSrc:      SpO2: 95% 93% 94% 97%  Weight:      Height:        Exam: Gen: Awake/alert and responsive, on RA CVS: RRR, no RG Resp: Anteriorly clear to auscultation, no rales/rhonchi-on ventilator.   Abd: Soft, obese, nontender, bowel sounds normal Ext: No lower extremity edema  HD access: Right IJ TDC connected to dialysis   Renal-related home meds - norvasc 10 - coreg 25 bid - auryxia 1 ac tid - losartan 50 every day    OP HD: MWF SW  4h  400/600   80.1kg  2/2 baht  RIJ TDC  Heparin 5000 - hectorol 2 mcg    CXR 11/10 - IMPRESSION:  No large pneumothorax. Bibasilar atelectasis/airspace disease has increased from prior exam, however this appearance may be due to patient positioning.   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 Weaned off pressors. Doing well. Removing chest tubes today per RN.  ESKD: usual HD MWF. SP iHD early am on Sunday. Will plan next hemodialysis on Tuesday (off schedule). Volume: up 3-4kg by wts, no edema, mild jvd. Lower vol w/ iHD as tol.  Anemia eskd: Hb stable now in 10-12 range. Sp prbc's.  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 Cont IV vdra for PTH control tiw. HIV infection: Antiretroviral therapy resumed. BP: on 2 BP lowering meds at home. May resume when needed.      Felicia Moselle MD  [facility] 08/28/2023, 10:28 AM  Recent Labs  Lab 08/27/23 1000 08/28/23 0203  HGB 11.1* 10.1*  ALBUMIN 2.9* 2.4*  CALCIUM 8.6* 8.5*  CREATININE 4.03* 5.56*  K 3.8 4.3   No results for input(s): "IRON", "TIBC", "FERRITIN" in the last 168 hours. Inpatient medications:   acetaminophen  1,000 mg Oral Q6H   Or   acetaminophen (TYLENOL) oral liquid 160 mg/5 mL  1,000 mg Per Tube Q6H   allopurinol  100 mg Oral Daily   aspirin EC  325 mg Oral Daily   Or   aspirin  324 mg Per Tube Daily   bisacodyl  10 mg Oral Daily   Or   bisacodyl  10 mg Rectal Daily   budesonide  0.25 mg Nebulization BID   Chlorhexidine Gluconate Cloth  6 each Topical Daily   docusate sodium  200 mg Oral Daily   dolutegravir  50 mg Oral Daily   doxercalciferol  2 mcg Intravenous Q T,Th,Sa-HD   emtricitabine-tenofovir AF  1 tablet Oral Daily   ferric citrate  210 mg Oral TID WC   heparin  5,000 Units Subcutaneous Q8H   insulin aspart  0-24 Units Subcutaneous TID WC   levothyroxine  175 mcg Oral Daily   metoprolol tartrate  12.5 mg Oral BID   Or   metoprolol tartrate  12.5 mg Per Tube BID   pantoprazole  40 mg Oral Daily   rosuvastatin  10 mg Oral Daily   sodium chloride flush  3 mL Intravenous Q12H    dextrose, methocarbamol (ROBAXIN) injection, metoprolol tartrate, morphine injection, ondansetron (ZOFRAN) IV, mouth  rinse, oxyCODONE, sodium chloride flush, traMADol

## 2023-08-28 NOTE — Progress Notes (Signed)
3 Days Post-Op Procedure(s) (LRB): AORTIC ARCH DEBRANCHING USING 12X8X8MM HEMASHIELD GRAFT (N/A) TRANSESOPHAGEAL ECHOCARDIOGRAM (N/A) THORACIC AORTIC ENDOVASCULAR STENT GRAFT (N/A) ULTRASOUND GUIDANCE FOR VASCULAR ACCESS, RIGHT FEMORAL ARTERY (Right) Subjective:  No complaints.  Nurses note she is generally deconditioned and required support sitting on side of bed. Has not been OOB yet.  Objective: Vital signs in last 24 hours: Temp:  [96.8 F (36 C)-98.9 F (37.2 C)] 98.9 F (37.2 C) (11/11 0400) Pulse Rate:  [72-104] 81 (11/11 0600) Cardiac Rhythm: Normal sinus rhythm (11/10 0800) Resp:  [10-21] 12 (11/11 0600) BP: (106-134)/(64-68) 132/65 (11/10 0818) SpO2:  [93 %-100 %] 96 % (11/11 0600) FiO2 (%):  [36 %] 36 % (11/10 0745) Weight:  [83.8 kg-84.6 kg] 83.8 kg (11/11 0500)  Hemodynamic parameters for last 24 hours:    Intake/Output from previous day: 11/10 0701 - 11/11 0700 In: 242.6 [I.V.:242.6] Out: 1275 [Urine:80; Chest Tube:195] Intake/Output this shift: Total I/O In: 24.4 [I.V.:24.4] Out: 80 [Urine:10; Chest Tube:70]  General appearance: alert and cooperative Neurologic: intact Heart: regular rate and rhythm Lungs: clear to auscultation bilaterally Extremities: mild edema Wound: dressing dry  Lab Results: Recent Labs    08/27/23 1000 08/28/23 0203  WBC 25.0* 16.2*  HGB 11.1* 10.1*  HCT 34.1* 30.9*  PLT 155 129*   BMET:  Recent Labs    08/27/23 1000 08/28/23 0203  NA 135 134*  K 3.8 4.3  CL 98 97*  CO2 22 21*  GLUCOSE 107* 97  BUN 28* 42*  CREATININE 4.03* 5.56*  CALCIUM 8.6* 8.5*    PT/INR:  Recent Labs    08/28/23 0203  LABPROT 16.6*  INR 1.3*   ABG    Component Value Date/Time   PHART 7.376 08/26/2023 1412   HCO3 20.9 08/26/2023 1412   TCO2 22 08/26/2023 1412   ACIDBASEDEF 4.0 (H) 08/26/2023 1412   O2SAT 92 08/26/2023 1412   CBG (last 3)  Recent Labs    08/27/23 2002 08/27/23 2311 08/28/23 0434  GLUCAP 96 80 81     Assessment/Plan: S/P Procedure(s) (LRB): AORTIC ARCH DEBRANCHING USING 12X8X8MM HEMASHIELD GRAFT (N/A) TRANSESOPHAGEAL ECHOCARDIOGRAM (N/A) THORACIC AORTIC ENDOVASCULAR STENT GRAFT (N/A) ULTRASOUND GUIDANCE FOR VASCULAR ACCESS, RIGHT FEMORAL ARTERY (Right)  POD 3 Hemodynamically stable on low dose Lopressor. DC arterial line.  DC chest tubes.   Glucose under good control on SSI.  IS, OOB.  PT/OT.  HD per nephrology. Would like to get back to baseline wt.  LOS: 3 days    [name] 08/28/2023

## 2023-08-29 ENCOUNTER — Inpatient Hospital Stay (HOSPITAL_COMMUNITY): Payer: Medicare HMO

## 2023-08-29 ENCOUNTER — Encounter (HOSPITAL_COMMUNITY): Payer: Self-pay | Admitting: Surgery

## 2023-08-29 DIAGNOSIS — I82409 Acute embolism and thrombosis of unspecified deep veins of unspecified lower extremity: Secondary | ICD-10-CM

## 2023-08-29 DIAGNOSIS — M7989 Other specified soft tissue disorders: Secondary | ICD-10-CM | POA: Diagnosis not present

## 2023-08-29 HISTORY — DX: Acute embolism and thrombosis of unspecified deep veins of unspecified lower extremity: I82.409

## 2023-08-29 LAB — TYPE AND SCREEN
ABO/RH(D): O POS
Antibody Screen: NEGATIVE
Unit division: 0
Unit division: 0
Unit division: 0
Unit division: 0

## 2023-08-29 LAB — RENAL FUNCTION PANEL
Albumin: 2.4 g/dL — ABNORMAL LOW (ref 3.5–5.0)
Anion gap: 15 (ref 5–15)
BUN: 70 mg/dL — ABNORMAL HIGH (ref 6–20)
CO2: 23 mmol/L (ref 22–32)
Calcium: 8.6 mg/dL — ABNORMAL LOW (ref 8.9–10.3)
Chloride: 95 mmol/L — ABNORMAL LOW (ref 98–111)
Creatinine, Ser: 7.34 mg/dL — ABNORMAL HIGH (ref 0.44–1.00)
GFR, Estimated: 6 mL/min — ABNORMAL LOW (ref >60)
Glucose, Bld: 118 mg/dL — ABNORMAL HIGH (ref 70–99)
Phosphorus: 8 mg/dL — ABNORMAL HIGH (ref 2.5–4.6)
Potassium: 4.1 mmol/L (ref 3.5–5.1)
Sodium: 133 mmol/L — ABNORMAL LOW (ref 135–145)

## 2023-08-29 LAB — BPAM RBC
Blood Product Expiration Date: 202411282359
Blood Product Expiration Date: 202411282359
Blood Product Expiration Date: 202411282359
Blood Product Expiration Date: 202411282359
ISSUE DATE / TIME: 202411080945
ISSUE DATE / TIME: 202411080945
ISSUE DATE / TIME: 202411081828
ISSUE DATE / TIME: 202411082048
Unit Type and Rh: 5100
Unit Type and Rh: 5100
Unit Type and Rh: 5100
Unit Type and Rh: 5100

## 2023-08-29 LAB — CBC
HCT: 31.3 % — ABNORMAL LOW (ref 36.0–46.0)
Hemoglobin: 9.9 g/dL — ABNORMAL LOW (ref 12.0–15.0)
MCH: 29.4 pg (ref 26.0–34.0)
MCHC: 31.6 g/dL (ref 30.0–36.0)
MCV: 92.9 fL (ref 80.0–100.0)
Platelets: 167 10*3/uL (ref 150–400)
RBC: 3.37 MIL/uL — ABNORMAL LOW (ref 3.87–5.11)
RDW: 16.8 % — ABNORMAL HIGH (ref 11.5–15.5)
WBC: 11.5 10*3/uL — ABNORMAL HIGH (ref 4.0–10.5)
nRBC: 0 % (ref 0.0–0.2)

## 2023-08-29 LAB — APTT: aPTT: 43 s — ABNORMAL HIGH (ref 24–36)

## 2023-08-29 LAB — FIBRINOGEN: Fibrinogen: >800 mg/dL — ABNORMAL HIGH (ref 210–475)

## 2023-08-29 LAB — PROTIME-INR
INR: 1.4 — ABNORMAL HIGH (ref 0.8–1.2)
Prothrombin Time: 17.6 s — ABNORMAL HIGH (ref 11.4–15.2)

## 2023-08-29 LAB — GLUCOSE, CAPILLARY
Glucose-Capillary: 79 mg/dL (ref 70–99)
Glucose-Capillary: 72 mg/dL (ref 70–99)
Glucose-Capillary: 118 mg/dL — ABNORMAL HIGH (ref 70–99)

## 2023-08-29 MED ORDER — LIDOCAINE HCL (PF) 1 % IJ SOLN
5.0000 mL | INTRAMUSCULAR | Status: DC | PRN
Start: 2023-08-29 — End: 2023-08-29

## 2023-08-29 MED ORDER — HEPARIN (PORCINE) 25000 UT/250ML-% IV SOLN
1200.0000 [IU]/h | INTRAVENOUS | Status: AC
  Administered 2023-08-29: 1300 [IU]/h via INTRAVENOUS
  Filled 2023-08-29 (×2): qty 250

## 2023-08-29 MED ORDER — HEPARIN SODIUM (PORCINE) 1000 UNIT/ML DIALYSIS
1000.0000 [IU] | INTRAMUSCULAR | Status: DC | PRN
Start: 2023-08-29 — End: 2023-08-29

## 2023-08-29 MED ORDER — HEPARIN SODIUM (PORCINE) 1000 UNIT/ML IJ SOLN
3800.0000 [IU] | Freq: Once | INTRAMUSCULAR | Status: AC
  Administered 2023-08-29: 3800 [IU]
  Filled 2023-08-29: qty 4

## 2023-08-29 MED ORDER — LIDOCAINE-PRILOCAINE 2.5-2.5 % EX CREA
1.0000 | TOPICAL_CREAM | CUTANEOUS | Status: DC | PRN
Start: 2023-08-29 — End: 2023-08-29

## 2023-08-29 MED ORDER — ALTEPLASE 2 MG IJ SOLR: 2.0000 mg | Freq: Once | INTRAMUSCULAR | Status: DC | PRN

## 2023-08-29 MED ORDER — PENTAFLUOROPROP-TETRAFLUOROETH EX AERO: 1.0000 | INHALATION_SPRAY | CUTANEOUS | Status: DC | PRN

## 2023-08-29 MED ORDER — MAGIC MOUTHWASH
5.0000 mL | Freq: Three times a day (TID) | ORAL | Status: DC
Start: 2023-08-29 — End: 2023-09-03
  Administered 2023-08-29 – 2023-09-03 (×11): 5 mL via ORAL
  Filled 2023-08-29 (×17): qty 5

## 2023-08-29 MED ORDER — HEPARIN SODIUM (PORCINE) 1000 UNIT/ML DIALYSIS
2500.0000 [IU] | Freq: Once | INTRAMUSCULAR | Status: AC
  Administered 2023-08-29: 2500 [IU] via INTRAVENOUS_CENTRAL
  Filled 2023-08-29: qty 3

## 2023-08-29 MED ORDER — NEPRO/CARBSTEADY PO LIQD: 237.0000 mL | ORAL | Status: DC | PRN

## 2023-08-29 MED ORDER — ANTICOAGULANT SODIUM CITRATE 4% (200MG/5ML) IV SOLN: 5.0000 mL | Status: DC | PRN

## 2023-08-29 MED ORDER — HEPARIN SODIUM (PORCINE) 1000 UNIT/ML DIALYSIS
2000.0000 [IU] | INTRAMUSCULAR | Status: AC | PRN
  Administered 2023-08-29: 2000 [IU] via INTRAVENOUS_CENTRAL
  Filled 2023-08-29: qty 2

## 2023-08-29 MED ORDER — MINOCYCLINE HCL 50 MG PO CAPS
100.0000 mg | ORAL_CAPSULE | Freq: Every day | ORAL | Status: DC
  Administered 2023-08-29 – 2023-09-03 (×6): 100 mg via ORAL
  Filled 2023-08-29 (×5): qty 2
  Filled 2023-08-29: qty 1
  Filled 2023-08-29: qty 2

## 2023-08-29 NOTE — Progress Notes (Signed)
Patient ID: [name], female   DOB: 1965-12-17, 57 y.o.   MRN: [id number]  TCTS Evening Rounds:  Hemodynamically stable.  Tolerated HD well.   Venous duplex shows acute DVT in left internal jugular, left brachial, axillary and subclavian veins most likely due to trauma and compression of brachiocephalic vein from surgery and branch grafts running under vein. Heparin ordered but waiting on IV start.   Sitting up in chair and ate a little dinner.   Pt came this am but she was heading off to HD so no eval done.

## 2023-08-29 NOTE — Progress Notes (Signed)
4 Days Post-Op Procedure(s) (LRB): AORTIC ARCH DEBRANCHING USING 12X8X8MM HEMASHIELD GRAFT (N/A) TRANSESOPHAGEAL ECHOCARDIOGRAM (N/A) THORACIC AORTIC ENDOVASCULAR STENT GRAFT (N/A) ULTRASOUND GUIDANCE FOR VASCULAR ACCESS, RIGHT FEMORAL ARTERY (Right) Subjective: Feels ok, passing a lot of gas. No BM yet. Some nausea.  Objective: Vital signs in last 24 hours: Temp:  [97.2 F (36.2 C)-98.7 F (37.1 C)] 98 F (36.7 C) (11/12 0641) Pulse Rate:  [73-179] 73 (11/12 0600) Cardiac Rhythm: Normal sinus rhythm (11/12 0400) Resp:  [11-25] 11 (11/12 0600) BP: (118-170)/(67-124) 152/74 (11/12 0600) SpO2:  [69 %-100 %] 91 % (11/12 0600) Weight:  [85.4 kg] 85.4 kg (11/12 0600)  Hemodynamic parameters for last 24 hours:    Intake/Output from previous day: 11/11 0701 - 11/12 0700 In: 490 [P.O.:490] Out: 160 [Urine:90; Chest Tube:70] Intake/Output this shift: No intake/output data recorded.  General appearance: alert and cooperative Neurologic: intact Heart: regular rate and rhythm Lungs: clear to auscultation bilaterally Abdomen: soft, non-tender; bowel sounds normal Extremities: edema mild Wound: incision healing well  Lab Results: Recent Labs    08/27/23 1000 08/28/23 0203  WBC 25.0* 16.2*  HGB 11.1* 10.1*  HCT 34.1* 30.9*  PLT 155 129*   BMET:  Recent Labs    08/27/23 1000 08/28/23 0203  NA 135 134*  K 3.8 4.3  CL 98 97*  CO2 22 21*  GLUCOSE 107* 97  BUN 28* 42*  CREATININE 4.03* 5.56*  CALCIUM 8.6* 8.5*    PT/INR:  Recent Labs    08/28/23 1451  LABPROT 17.3*  INR 1.4*   ABG    Component Value Date/Time   PHART 7.376 08/26/2023 1412   HCO3 20.9 08/26/2023 1412   TCO2 22 08/26/2023 1412   ACIDBASEDEF 4.0 (H) 08/26/2023 1412   O2SAT 92 08/26/2023 1412   CBG (last 3)  Recent Labs    08/28/23 1549 08/28/23 2141 08/29/23 0556  GLUCAP 113* 111* 79   CXR: clearf  Assessment/Plan: S/P Procedure(s) (LRB): AORTIC ARCH DEBRANCHING USING 12X8X8MM  HEMASHIELD GRAFT (N/A) TRANSESOPHAGEAL ECHOCARDIOGRAM (N/A) THORACIC AORTIC ENDOVASCULAR STENT GRAFT (N/A) ULTRASOUND GUIDANCE FOR VASCULAR ACCESS, RIGHT FEMORAL ARTERY (Right)  POD 4  Hemodynamically stable in sinus rhythm.   Planning HD today. She is 15 lbs over preop wt if accurate.  She got up to chair this am. PT consulted for help with ambulation.   DC neck line after HD today.  Plan transfer to 4E after evaluated by PT and she has HD today.  Continue IS.   LOS: 4 days    [name] 08/29/2023

## 2023-08-29 NOTE — Progress Notes (Signed)
Left upper extremity venous duplex  has been completed. Refer to [facility] under chart review to view preliminary results. Results given to patient's nurse, [place].  08/29/2023  4:43 PM Annastasia Haskins, Gerarda Gunther

## 2023-08-29 NOTE — Plan of Care (Signed)
  Problem: Education: Goal: Knowledge of discharge needs will improve Outcome: Progressing   Problem: Education: Goal: Knowledge of General Education information will improve Description: Including pain rating scale, medication(s)/side effects and non-pharmacologic comfort measures Outcome: Progressing   Problem: Health Behavior/Discharge Planning: Goal: Ability to manage health-related needs will improve Outcome: Progressing   Problem: Clinical Measurements: Goal: Ability to maintain clinical measurements within normal limits will improve Outcome: Progressing

## 2023-08-29 NOTE — Progress Notes (Signed)
Received patient in bed.Awake,alert x 4.Consent verified.  Access used: Right HD catheter that worked well.Dressing on date>  Medicine given: Heparin  2500 unit pre -run and 2,000 mid run dose.                            Hectorol 2 mcg.                            Oxycontin 5 mg.  Duration of treatment 3.5 hours.   Uf GOAL:  1.3 L  Hemo comment:Not tolerating uf goal of 2L,complained a lot of cramping thus 1.3 L net UF from her.  Hand off to the patient's nurse.Back into her room with stable medical condition via transporter.

## 2023-08-29 NOTE — Progress Notes (Addendum)
  Progress Note    08/29/2023 8:04 AM 4 Days Post-Op  Subjective:  feels okay, fairly sleepy. Has passed flatus several times. No BM    Vitals:   08/29/23 0600 08/29/23 0641  BP: (!) 152/74   Pulse: 73   Resp: 11   Temp:  98 F (36.7 C)  SpO2: 91%     Physical Exam: General:  resting comfortably in the chair Cardiac:  regular Lungs:  nonlabored Incisions:  midline chest incision well appearing Extremities:  palpable DP pulses bilaterally. Intact movement and sensation of BLE. Mild edema of left arm   CBC    Component Value Date/Time   WBC 16.2 (H) 08/28/2023 0203   RBC 3.38 (L) 08/28/2023 0203   HGB 10.1 (L) 08/28/2023 0203   HGB 11.7 (L) 07/27/2021 1110   HCT 30.9 (L) 08/28/2023 0203   PLT 129 (L) 08/28/2023 0203   PLT 228 07/27/2021 1110   MCV 91.4 08/28/2023 0203   MCH 29.9 08/28/2023 0203   MCHC 32.7 08/28/2023 0203   RDW 17.1 (H) 08/28/2023 0203   LYMPHSABS 1.0 08/28/2023 0203   MONOABS 1.2 (H) 08/28/2023 0203   EOSABS 0.0 08/28/2023 0203   BASOSABS 0.0 08/28/2023 0203    BMET    Component Value Date/Time   NA 134 (L) 08/28/2023 0203   NA 142 12/05/2022 0000   K 4.3 08/28/2023 0203   CL 97 (L) 08/28/2023 0203   CO2 21 (L) 08/28/2023 0203   GLUCOSE 97 08/28/2023 0203   BUN 42 (H) 08/28/2023 0203   BUN 44 (A) 12/05/2022 0000   CREATININE 5.56 (H) 08/28/2023 0203   CREATININE 2.72 (H) 07/27/2021 1110   CREATININE 2.74 (H) 03/17/2021 1050   CALCIUM 8.5 (L) 08/28/2023 0203   GFRNONAA 8 (L) 08/28/2023 0203   GFRNONAA 20 (L) 07/27/2021 1110   GFRAA 39 (L) 07/14/2020 1053   GFRAA 33 (L) 08/12/2019 1447    INR    Component Value Date/Time   INR 1.4 (H) 08/29/2023 0615     Intake/Output Summary (Last 24 hours) at 08/29/2023 0804 Last data filed at 08/29/2023 0600 Gross per 24 hour  Intake 490 ml  Output 60 ml  Net 430 ml      Assessment/Plan:  57 y.o. female is 4 days post op, s/p: aortic arch debranching with TEVAR    -Continues  to make good progress in her recovery. No pain this morning -BLE warm and well perfused with palpable DP pulses. Intact motor and sensation of BLE -Midline chest incision well appearing and dry -Has passed flatus, still no BM -Plans per CT surgery is possibly to floor today after dialysis   Loel Dubonnet PA-C [facility] [phone] 08/29/2023 8:04 AM  VASCULAR STAFF ADDENDUM: I have independently interviewed and examined the patient. I agree with the above.  Alert and oriented this morning.  Left arm swelling-some concern for DVT.  Will order study today. Dialysis today. Legs strong. Overall happy with how she is progressing.  Victorino Sparrow MD [facility] Phone Number: [phone] 08/29/2023 9:44 AM

## 2023-08-29 NOTE — Progress Notes (Signed)
PT Cancellation Note  Patient Details Name: [name] MRN: [id number] DOB: 04/08/1966   Cancelled Treatment:    Reason Eval/Treat Not Completed: Patient at procedure or test/unavailable (pt leaving for HD)   [name] Reasons 08/29/2023, 9:18 AM Merryl Hacker, PT Acute Rehabilitation Services Office: [phone]

## 2023-08-29 NOTE — Progress Notes (Signed)
PHARMACY - ANTICOAGULATION CONSULT NOTE  Pharmacy Consult for heparin Indication: DVT  Allergies  Allergen Reactions   Genvoya [Elviteg-Cobic-Emtricit-Tenofaf] Hives   Lisinopril Cough   Aldactone [Spironolactone] Hives   Tegaderm Ag Mesh [Silver] Itching    Patient Measurements: Height: 5\' 5"  (165.1 cm) Weight: 84 kg (185 lb 3 oz) IBW/kg (Calculated) : 57 Heparin Dosing Weight: ~75kg  Vital Signs: Temp: 98.2 F (36.8 C) (11/12 1620) Temp Source: Oral (11/12 1620) BP: 106/64 (11/12 1500) Pulse Rate: 89 (11/12 1359)  Labs: Recent Labs    08/27/23 1000 08/27/23 1620 08/28/23 0203 08/28/23 0919 08/28/23 1451 08/29/23 0615 08/29/23 0919 08/29/23 0946  HGB 11.1*  --  10.1*  --   --   --  9.9*  --   HCT 34.1*  --  30.9*  --   --   --  31.3*  --   PLT 155  --  129*  --   --   --  167  --   APTT 32   < > 35 39* 33 43*  --   --   LABPROT 15.0   < > 16.6* 16.8* 17.3* 17.6*  --   --   INR 1.2   < > 1.3* 1.3* 1.4* 1.4*  --   --   CREATININE 4.03*  --  5.56*  --   --   --   --  7.34*   < > = values in this interval not displayed.    Estimated Creatinine Clearance: 9.3 mL/min (A) (by C-G formula based on SCr of 7.34 mg/dL (H)).   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  Genes Analyzed (43 total): APC*, ATM*, AXIN2, BARD1, BMPR1A, BRCA1*, BRCA2*, BRIP1*, CDH1*, CDK4, CDKN2A, CHEK2*, DICER1, GALNT12, HOXB13, MEN1, MLH1*, MRE11A, MSH2*, MSH3, MSH6*, MUTYH*, NBN, NF1*, NTHL1, PALB2*, PMS2*, POLD1, POLE, PTEN*, RAD50, RAD51C*, RAD51D*, RET, SDHB, SDHD, SMAD4, SMARCA4, STK11 and TP53* (sequencing and deletion/duplication); EGFR (s   GERD 07/24/2006   GRAVE'S DISEASE 01/01/2008   History of hidradenitis suppurativa    History of kidney stones    History of thrush    HIV DISEASE 07/24/2006   dx March 05   Hyperlipidemia    HYPERTENSION 07/24/2006   Hyperthyroidism 08/2006   Grave's Disease -diffuse radiotracer uptake 08/25/06 Thyroid scan-Cold nodule to R lower lobe of thyrorid   Ileus (HCC) 02/14/2023   Menometrorrhagia    hx of   Nephrolithiasis    Panniculitis 05/12/2020   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HCC)    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 Completed 2 yrs Prednisone after lung bx confirmation. No symptoms since then.   Sebaceous cyst 04/21/2020   Suppurative hidradenitis    Thyroid cancer (HCC)    THYROID NODULE, RIGHT 02/08/2007    Medications:  Scheduled:   acetaminophen  1,000 mg Oral Q6H   Or   acetaminophen (TYLENOL) oral liquid 160 mg/5 mL  1,000 mg Per Tube Q6H   allopurinol  100 mg Oral Daily   aspirin EC  325 mg Oral Daily   Or   aspirin  324 mg Per Tube Daily    bisacodyl  10 mg Oral Daily   Or   bisacodyl  10 mg Rectal Daily   budesonide  0.25 mg Nebulization BID   Chlorhexidine Gluconate Cloth  6 each Topical Daily   Chlorhexidine Gluconate Cloth  6 each Topical Q0600   docusate sodium  200 mg Oral Daily   dolutegravir  50 mg Oral Daily   doxercalciferol  2 mcg Intravenous Q T,Th,Sa-HD   emtricitabine-tenofovir AF  1 tablet Oral Daily   ferric citrate  210 mg Oral TID WC   insulin aspart  0-24 Units Subcutaneous TID WC   levothyroxine  175 mcg Oral Daily   magic mouthwash  5 mL Oral TID   metoprolol tartrate  12.5 mg Oral BID   Or   metoprolol tartrate  12.5 mg Per Tube BID   minocycline  100 mg Oral Daily   pantoprazole  40 mg Oral Daily   rosuvastatin  10 mg Oral Daily   sodium chloride flush  3 mL Intravenous Q12H    Assessment: 57 yo female found to have acute DVT in LUE involving LIJ, L brachial veins, L axillary vein, L subclavian vein. 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 No AC PTA. Has been on [facility] for VTE ppx w/ last dose 11/12 @ 1400.   Goal of Therapy:  Heparin level 0.3-0.7 units/ml Monitor platelets by anticoagulation protocol: Yes   Plan:  No bolus given recent TEVAR and Type B Dissection Heparin 1300 units/hr Check heparin level in 8 hours Monitor daily heparin level, CBC F/u oral anticoag  Rexford Maus, PharmD, BCPS 08/29/2023 4:59 PM

## 2023-08-29 NOTE — Progress Notes (Signed)
[name] Progress Note  Subjective: seen in HD unit, no c/o's today.   Vitals:   08/29/23 0934 08/29/23 1000 08/29/23 1030 08/29/23 1100  BP: 138/77 133/83 119/79 (!) 108/54  Pulse: 86 83 90   Resp:      Temp:      TempSrc:      SpO2:  96%    Weight:      Height:        Exam: Gen: Awake/alert and responsive, on RA CVS: RRR, no RG Resp: Anteriorly clear to auscultation Abd: Soft, obese, nontender, bowel sounds normal Ext: No lower extremity edema  HD access: Right IJ TDC intact   Renal-related home meds - norvasc 10 - coreg 25 bid - auryxia 1 ac tid - losartan 50 every day    OP HD: MWF SW  4h  400/600   80.1kg  2/2 baht  RIJ TDC  Heparin 5000 - hectorol 2 mcg    CXR 11/10 - IMPRESSION:  No large pneumothorax. Bibasilar atelectasis/airspace disease has increased from prior exam, however this appearance may be due to patient positioning.   Assessment/ Plan: Status post thoracic aortic endovascular graft placement: 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 Off pressors and CT's removed yesterday.  ESKD: usual HD MWF. Had HD here early am on Sunday. Pt on HD today off schedule.  Volume: up 3-4kg by wts, no edema, mild jvd. Get vol down as tol w/ HD  Anemia eskd: Hb stable now in 10-12 range. Sp prbc's.  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 Cont IV vdra for PTH control tiw. HIV infection: Antiretroviral therapy resumed. BP: getting metoprolol 12.5 bid here, BP's are good.      Vinson Moselle MD  [facility] 08/29/2023, 11:28 AM  Recent Labs  Lab 08/28/23 0203 08/29/23 0919 08/29/23 0946  HGB 10.1* 9.9*  --   ALBUMIN 2.4*  --  2.4*  CALCIUM 8.5*  --  8.6*  PHOS 6.9*  --  8.0*  CREATININE 5.56*  --  7.34*  K 4.3  --  4.1   No results for input(s): "IRON", "TIBC", "FERRITIN" in the last 168 hours. Inpatient medications:  acetaminophen  1,000 mg Oral Q6H   Or   acetaminophen (TYLENOL) oral liquid 160 mg/5 mL   1,000 mg Per Tube Q6H   allopurinol  100 mg Oral Daily   aspirin EC  325 mg Oral Daily   Or   aspirin  324 mg Per Tube Daily   bisacodyl  10 mg Oral Daily   Or   bisacodyl  10 mg Rectal Daily   budesonide  0.25 mg Nebulization BID   Chlorhexidine Gluconate Cloth  6 each Topical Daily   Chlorhexidine Gluconate Cloth  6 each Topical Q0600   docusate sodium  200 mg Oral Daily   dolutegravir  50 mg Oral Daily   doxercalciferol  2 mcg Intravenous Q T,Th,Sa-HD   emtricitabine-tenofovir AF  1 tablet Oral Daily   ferric citrate  210 mg Oral TID WC   [START ON 08/30/2023] heparin  2,500 Units Dialysis Once in dialysis   heparin  5,000 Units Subcutaneous Q8H   heparin sodium (porcine)  3,800 Units Intracatheter Once   insulin aspart  0-24 Units Subcutaneous TID WC   levothyroxine  175 mcg Oral Daily   magic mouthwash  5 mL Oral TID   metoprolol tartrate  12.5 mg Oral BID   Or   metoprolol tartrate  12.5 mg Per Tube BID   minocycline  100 mg Oral Daily   pantoprazole  40 mg Oral Daily   rosuvastatin  10 mg Oral Daily   sodium chloride flush  3 mL Intravenous Q12H    anticoagulant sodium citrate     alteplase, anticoagulant sodium citrate, dextrose, feeding supplement (NEPRO CARB STEADY), heparin, [START ON 08/30/2023] heparin, lidocaine (PF), lidocaine-prilocaine, methocarbamol (ROBAXIN) injection, metoprolol tartrate, morphine injection, ondansetron (ZOFRAN) IV, mouth rinse, oxyCODONE, pentafluoroprop-tetrafluoroeth, sodium chloride flush, traMADol

## 2023-08-30 ENCOUNTER — Other Ambulatory Visit (HOSPITAL_COMMUNITY): Payer: Self-pay

## 2023-08-30 LAB — CBC
HCT: 30.3 % — ABNORMAL LOW (ref 36.0–46.0)
Hemoglobin: 9.7 g/dL — ABNORMAL LOW (ref 12.0–15.0)
MCH: 29.5 pg (ref 26.0–34.0)
MCHC: 32 g/dL (ref 30.0–36.0)
MCV: 92.1 fL (ref 80.0–100.0)
Platelets: 176 10*3/uL (ref 150–400)
RBC: 3.29 MIL/uL — ABNORMAL LOW (ref 3.87–5.11)
RDW: 16.5 % — ABNORMAL HIGH (ref 11.5–15.5)
WBC: 9.2 10*3/uL (ref 4.0–10.5)
nRBC: 0 % (ref 0.0–0.2)

## 2023-08-30 LAB — GLUCOSE, CAPILLARY
Glucose-Capillary: 93 mg/dL (ref 70–99)
Glucose-Capillary: 90 mg/dL (ref 70–99)
Glucose-Capillary: 164 mg/dL — ABNORMAL HIGH (ref 70–99)
Glucose-Capillary: 53 mg/dL — ABNORMAL LOW (ref 70–99)
Glucose-Capillary: 75 mg/dL (ref 70–99)
Glucose-Capillary: 135 mg/dL — ABNORMAL HIGH (ref 70–99)
Glucose-Capillary: 121 mg/dL — ABNORMAL HIGH (ref 70–99)
Glucose-Capillary: 148 mg/dL — ABNORMAL HIGH (ref 70–99)

## 2023-08-30 LAB — HEPARIN LEVEL (UNFRACTIONATED)
Heparin Unfractionated: 0.55 [IU]/mL (ref 0.30–0.70)
Heparin Unfractionated: 0.77 [IU]/mL — ABNORMAL HIGH (ref 0.30–0.70)

## 2023-08-30 MED ORDER — ASPIRIN 81 MG PO CHEW: 81.0000 mg | CHEWABLE_TABLET | Freq: Every day | ORAL | Status: DC

## 2023-08-30 MED ORDER — ASPIRIN 81 MG PO TBEC
81.0000 mg | DELAYED_RELEASE_TABLET | Freq: Every day | ORAL | Status: DC
  Administered 2023-08-30 – 2023-09-01 (×3): 81 mg via ORAL
  Filled 2023-08-30 (×3): qty 1

## 2023-08-30 MED ORDER — APIXABAN 5 MG PO TABS: 10.0000 mg | ORAL_TABLET | Freq: Two times a day (BID) | ORAL | Status: DC

## 2023-08-30 MED ORDER — APIXABAN 5 MG PO TABS: 5.0000 mg | ORAL_TABLET | Freq: Two times a day (BID) | ORAL | Status: DC

## 2023-08-30 MED ORDER — CHLORHEXIDINE GLUCONATE CLOTH 2 % EX PADS
6.0000 | MEDICATED_PAD | Freq: Every day | CUTANEOUS | Status: DC
  Administered 2023-08-31 – 2023-09-03 (×3): 6 via TOPICAL

## 2023-08-30 MED ORDER — APIXABAN 5 MG PO TABS
5.0000 mg | ORAL_TABLET | Freq: Two times a day (BID) | ORAL | Status: DC
  Administered 2023-08-30 – 2023-09-03 (×8): 5 mg via ORAL
  Filled 2023-08-30 (×8): qty 1

## 2023-08-30 MED FILL — Thrombin (Recombinant) For Soln 20000 Unit: CUTANEOUS | Qty: 1 | Status: AC

## 2023-08-30 NOTE — Evaluation (Signed)
Physical Therapy Evaluation Patient Details Name: [name] MRN: [id number] DOB: 1965-11-02 Today's Date: 08/30/2023  History of Present Illness  The patient is a 57 year old woman who underwent TEVAR by Dr. 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  Found to have slipping of the endovascular graft on 7/18 and was planned for aortic arch de-branching and stent in Sept but developed an acute abscess under R breast which postponed surgery to 11/8. PMH: diabetes, hypertension, end-stage renal disease on hemodialysis, hypothyroidism due to Graves' disease, HIV on antiviral therapy, metastatic papillary thyroid carcinoma status post thyroidectomy and limited left neck dissection in 2018 followed by radiation and chemotherapy   Clinical Impression  Pt admitted with above. Pt sleepy but alert and able to answer questions properly and follow commands. Pt with noted generalized weakness and truncal instability with ambulation requiring minA and use of RW. Pt educated and provided with sternal precaution hand out however pt with poor carry over. Pt with good home set up and support. Anticipate pt to progress well and be able to return home with family with use of RW and 3n1 commode. Acute PT to cont to follow.        If plan is discharge home, recommend the following: A little help with walking and/or transfers;A little help with bathing/dressing/bathroom;Help with stairs or ramp for entrance   Can travel by private vehicle        Equipment Recommendations Rolling walker (2 wheels);BSC/3in1  Recommendations for Other Services       Functional Status Assessment Patient has had a recent decline in their functional status and demonstrates the ability to make significant improvements in function in a reasonable and predictable amount of time.     Precautions / Restrictions Precautions Precautions: Fall;Sternal Precaution Booklet Issued: Yes (comment) Precaution Comments: pt  sleepy with poor carry over of precautions, hand out given to patient Restrictions Weight Bearing Restrictions: Yes RUE Weight Bearing: Non weight bearing LUE Weight Bearing: Non weight bearing Other Position/Activity Restrictions: sternal precautions      Mobility  Bed Mobility               General bed mobility comments: pt received sitting up in chair    Transfers Overall transfer level: Needs assistance   Transfers: Sit to/from Stand Sit to Stand: Min assist, Mod assist           General transfer comment: minA to stand up from chair but modA from toliet due to lower surface height. Pt held onto pillow, minA to power up using rocking momentum    Ambulation/Gait Ambulation/Gait assistance: Min assist, +2 safety/equipment Gait Distance (Feet): 60 Feet (x1, 80x1) Assistive device: Rolling walker (2 wheels) Gait Pattern/deviations: Step-through pattern, Decreased stride length, Knees buckling, Narrow base of support Gait velocity: dec Gait velocity interpretation: <1.31 ft/sec, indicative of household ambulator   General Gait Details: pt with noted knee instability and vearing to L, pt with noted truncal weakness as well. with increased distance pt becomes diaphoretic and nauseated needing to sit at 60'. BP stable at 130s/70s. SPO2 > 95% on 6LO2 via [place], minA for walker management  Stairs            Wheelchair Mobility     Tilt Bed    Modified Rankin (Stroke Patients Only)       Balance Overall balance assessment: Needs assistance Sitting-balance support: Feet supported, No upper extremity supported Sitting balance-Leahy Scale: Fair     Standing balance support: Single extremity  supported, During functional activity Standing balance-Leahy Scale: Poor Standing balance comment: reliant on UE support unilaterally to stabilize while performing pericare.                             Pertinent Vitals/Pain Pain Assessment Pain Assessment:  0-10 Pain Score: 2  Pain Descriptors / Indicators: Sore    Home Living Family/patient expects to be discharged to:: Private residence Living Arrangements: Children Available Help at Discharge: Family;Available PRN/intermittently (trying to coordinate sister and neice to assist while dtr at work) Type of Home: House Home Access: Stairs to enter Entrance Stairs-Rails: None Entrance Stairs-Number of Steps: 2 Alternate Level Stairs-Number of Steps: 10 Home Layout: Two level;Able to live on main level with bedroom/bathroom Home Equipment: None      Prior Function Prior Level of Function : Independent/Modified Independent;Driving             Mobility Comments: community amb, drove self to dialysis ADLs Comments: indep with adls, on disability since april     Extremity/Trunk Assessment   Upper Extremity Assessment Upper Extremity Assessment: LUE deficits/detail LUE Deficits / Details: pt with noted L UE DVT presenting with swelling    Lower Extremity Assessment Lower Extremity Assessment: Generalized weakness    Cervical / Trunk Assessment Cervical / Trunk Assessment: Other exceptions (sternal incision)  Communication   Communication Communication: No apparent difficulties  Cognition Arousal: Alert (but sleepy) Behavior During Therapy: WFL for tasks assessed/performed Overall Cognitive Status: Impaired/Different from baseline Area of Impairment: Safety/judgement, Awareness, Problem solving                         Safety/Judgement: Decreased awareness of deficits Awareness: Emergent Problem Solving: Slow processing General Comments: pt reports "My thinking is off, it's slow." Pt sleepy requring freq verbal cues to keep pt on task        General Comments General comments (skin integrity, edema, etc.): sternal incision intact without drainage, L UE with edema due to L UE DVT. pt assisted to bathroom, + BM, assist for hygiene posteriorly, pt able to perform  pericare anteriorly    Exercises     Assessment/Plan    PT Assessment Patient needs continued PT services  PT Problem List Decreased strength;Decreased range of motion;Decreased activity tolerance;Decreased balance;Decreased mobility;Decreased knowledge of use of DME       PT Treatment Interventions DME instruction;Gait training;Stair training;Functional mobility training;Therapeutic activities;Balance training;Therapeutic exercise;Neuromuscular re-education    PT Goals (Current goals can be found in the Care Plan section)  Acute Rehab PT Goals Patient Stated Goal: home PT Goal Formulation: With patient Time For Goal Achievement: 09/13/23 Potential to Achieve Goals: Good    Frequency Min 1X/week     Co-evaluation               AM-PAC PT "6 Clicks" Mobility  Outcome Measure Help needed turning from your back to your side while in a flat bed without using bedrails?: A Little Help needed moving from lying on your back to sitting on the side of a flat bed without using bedrails?: A Little Help needed moving to and from a bed to a chair (including a wheelchair)?: A Little Help needed standing up from a chair using your arms (e.g., wheelchair or bedside chair)?: A Lot Help needed to walk in hospital room?: A Lot Help needed climbing 3-5 steps with a railing? : A Lot 6 Click Score: 15  End of Session Equipment Utilized During Treatment: Gait belt;Oxygen Activity Tolerance: Patient tolerated treatment well Patient left: in chair;with call bell/phone within reach Nurse Communication: Mobility status PT Visit Diagnosis: Unsteadiness on feet (R26.81)    Time: 3086-5784 PT Time Calculation (min) (ACUTE ONLY): 34 min   Charges:   PT Evaluation $PT Eval Moderate Complexity: 1 Mod PT Treatments $Gait Training: 8-22 mins PT General Charges $$ ACUTE PT VISIT: 1 Visit         Lewis Shock, PT, DPT Acute Rehabilitation Services Secure chat preferred Office #:  [phone]   Iona Hansen 08/30/2023, 11:13 AM

## 2023-08-30 NOTE — Evaluation (Signed)
Occupational Therapy Evaluation Patient Details Name: [name] MRN: [id number] DOB: 09/06/1966 Today's Date: 08/30/2023   History of Present Illness The patient is a 57 year old woman who underwent TEVAR by Dr. 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  Found to have slipping of the endovascular graft on 7/18 and was planned for aortic arch de-branching and stent in Sept but developed an acute abscess under R breast which postponed surgery to 11/8. 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 Upon eval, pt presents with decreased activity tolerance, balance, safety, arousal, strength, and knowledge of precautions. Pt unable to recall precautions from PT session this morning, however, given increased time and indirect cues after initial education able to repeat precautions back immediately and at end of session. Pt performing ADL with up to Min A this session. Min-mod cues for precautions. Will continue to follow. Recommending HHOT at discharge.       If plan is discharge home, recommend the following: A little help with walking and/or transfers;A little help with bathing/dressing/bathroom;Assistance with cooking/housework;Assist for transportation;Help with stairs or ramp for entrance    Functional Status Assessment  Patient has had a recent decline in their functional status and demonstrates the ability to make significant improvements in function in a reasonable and predictable amount of time.  Equipment Recommendations  Tub/shower seat    Recommendations for Other Services       Precautions / Restrictions Precautions Precautions:  Fall;Sternal Precaution Booklet Issued: Yes (comment) Precaution Comments: pt sleepy with poor carry over of precautions, hand out given to patient Restrictions Weight Bearing Restrictions: Yes RUE Weight Bearing: Non weight bearing LUE Weight Bearing: Non weight bearing Other Position/Activity Restrictions: sternal precautions      Mobility Bed Mobility Overal bed mobility: Needs Assistance Bed Mobility: Rolling, Sidelying to Sit, Sit to Sidelying Rolling: Contact guard assist Sidelying to sit: Min assist     Sit to sidelying: Mod assist General bed mobility comments: educated regarding use of compensatory strategies and maintenance of precautions.    Transfers Overall transfer level: Needs assistance   Transfers: Sit to/from Stand Sit to Stand: Min assist, Mod assist           General transfer comment: minA to stand up from chair but modA from toliet due to lower surface height. Pt held onto pillow, minA to power up using rocking momentum      Balance Overall balance assessment: Needs assistance Sitting-balance support: Feet supported, No upper extremity supported Sitting balance-Leahy Scale: Fair     Standing balance support: Single extremity supported, During functional activity Standing balance-Leahy Scale: Poor Standing balance comment: reliant on UE support unilaterally to stabilize while performing pericare.                           ADL either performed or assessed with clinical judgement   ADL Overall ADL's : Needs assistance/impaired Eating/Feeding: Modified independent;Sitting   Grooming: Contact guard assist;Minimal assistance;Standing   Upper Body Bathing: Set up;Sitting   Lower Body Bathing: Minimal assistance;Sit to/from stand   Upper Body Dressing : Minimal assistance;Sitting   Lower Body Dressing: Contact guard assist;Sitting/lateral leans Lower Body Dressing Details (indicate cue type  and reason): to don socks. Increased time  to achieve figure 4 Toilet Transfer: Minimal assistance;Stand-pivot Statistician Details (indicate cue type and reason): for balance Toileting- Clothing Manipulation and Hygiene: Minimal assistance Toileting - Clothing Manipulation Details (indicate cue type and reason): reviewed toileting within precautions and pt return demo     Functional mobility during ADLs: Minimal assistance;Rolling walker (2 wheels)       Vision Patient Visual Report: No change from baseline       Perception Perception: Not tested       Praxis Praxis: Not tested       Pertinent Vitals/Pain Pain Assessment Pain Assessment: Faces Faces Pain Scale: Hurts a little bit Pain Descriptors / Indicators: Sore, Operative site guarding Pain Intervention(s): Monitored during session     Extremity/Trunk Assessment Upper Extremity Assessment Upper Extremity Assessment: LUE deficits/detail (using BUE with intermittent cues for precautions) LUE Deficits / Details: pt with noted L UE DVT presenting with swelling   Lower Extremity Assessment Lower Extremity Assessment: Defer to PT evaluation   Cervical / Trunk Assessment Cervical / Trunk Assessment: Other exceptions (sternal incision)   Communication Communication Communication: No apparent difficulties   Cognition Arousal: Alert (but sleepy) Behavior During Therapy: WFL for tasks assessed/performed Overall Cognitive Status: Impaired/Different from baseline Area of Impairment: Safety/judgement, Awareness, Problem solving, Attention                   Current Attention Level: Sustained, Selective (decr overall secondary to intermittent lethargy)     Safety/Judgement: Decreased awareness of deficits Awareness: Emergent Problem Solving: Slow processing General Comments: pt reports "My thinking is off, it's slow." Pt sleepy but able to follow commands and repeat previously taught information     General Comments       Exercises     Shoulder  Instructions      Home Living Family/patient expects to be discharged to:: Private residence Living Arrangements: Children Available Help at Discharge: Family;Available PRN/intermittently (trying to coordinate sister and neice to assist while dtr at work) Type of Home: House Home Access: Stairs to enter [employer] of Steps: 2 Entrance Stairs-Rails: None Home Layout: Two level;Able to live on main level with bedroom/bathroom Alternate Level Stairs-Number of Steps: 10 Alternate Level Stairs-Rails: Right;Left;Can reach both Bathroom Shower/Tub: Chief Strategy Officer: Standard     Home Equipment: None          Prior Functioning/Environment Prior Level of Function : Independent/Modified Independent;Driving             Mobility Comments: community amb, drove self to dialysis ADLs Comments: indep with adls, on disability since april        OT Problem List: Decreased strength;Impaired balance (sitting and/or standing);Decreased activity tolerance;Cardiopulmonary status limiting activity;Decreased knowledge of precautions      OT Treatment/Interventions: Self-care/ADL training;Therapeutic exercise;DME and/or AE instruction;Balance training;Patient/family education;Therapeutic activities    OT Goals(Current goals can be found in the care plan section) Acute Rehab OT Goals Patient Stated Goal: get better OT Goal Formulation: With patient Time For Goal Achievement: 09/13/23 Potential to Achieve Goals: Good  OT Frequency: Min 1X/week    Co-evaluation              AM-PAC OT "6 Clicks" Daily Activity     Outcome Measure Help from another person eating meals?: None Help from another person taking care of personal grooming?: A Little Help from another person toileting, which includes using toliet, bedpan, or urinal?: A Little Help from another person  bathing (including washing, rinsing, drying)?: A Little Help from another person to put on and  taking off regular upper body clothing?: A Little Help from another person to put on and taking off regular lower body clothing?: A Little 6 Click Score: 19   End of Session Equipment Utilized During Treatment: Gait belt;Rolling walker (2 wheels) (heart pillow) Nurse Communication: Mobility status  Activity Tolerance: Patient tolerated treatment well Patient left: in bed;with call bell/phone within reach;Other (comment) (food services taking order in room)  OT Visit Diagnosis: Unsteadiness on feet (R26.81);Muscle weakness (generalized) (M62.81)                Time: 2130-8657 OT Time Calculation (min): 29 min Charges:  OT General Charges $OT Visit: 1 Visit OT Evaluation $OT Eval Moderate Complexity: 1 Mod OT Treatments $Self Care/Home Management : 8-22 mins  Tyler Deis, OTR/L [facility] Acute Rehabilitation Office: [phone]   Myrla Halsted 08/30/2023, 2:49 PM

## 2023-08-30 NOTE — Progress Notes (Signed)
[facility] Progress Note  Subjective: seen in room. Had HD yest w/ 1.3 L off.   Vitals:   08/30/23 1200 08/30/23 1300 08/30/23 1400 08/30/23 1500  BP: (!) 142/74 127/74 (!) 142/73 (!) 140/71  Pulse: 72 77 75 72  Resp: 12 19 12 16   Temp:      TempSrc:      SpO2: 95% 95% 93% 94%  Weight:      Height:        Exam: Gen: Awake/alert and responsive, on RA CVS: RRR, no RG Resp: Anteriorly clear to auscultation Abd: Soft, obese, nontender, bowel sounds normal Ext: No lower extremity edema  HD access: Right IJ TDC intact   Renal-related home meds - norvasc 10 - coreg 25 bid - auryxia 1 ac tid - losartan 50 every day    OP HD: MWF SW  4h  400/600   80.1kg  2/2 baht  RIJ TDC  Heparin 5000 - hectorol 2 mcg    CXR 11/12- clear CXR    Assessment/ Plan: Status post thoracic aortic endovascular graft placement: 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 Off pressors and CT's removed yesterday.  ESKD: usual HD MWF. Had HD here early am on Sunday, then again on 11/12 off schedule. Next HD tomorrow.  Volume: up 1-2kg by wts. Max UF w/ HD tomorrow.  Anemia eskd: Hb stable now in 9- 11 range. Sp prbc's.  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 Cont IV vdra for PTH control tiw. HIV infection: Antiretroviral therapy resumed. BP: getting metoprolol 12.5 bid here, BP's are good.      Felicia Moselle MD  [facility] 08/30/2023, 3:52 PM  Recent Labs  Lab 08/28/23 0203 08/29/23 0919 08/29/23 0946 08/30/23 0242  HGB 10.1* 9.9*  --  9.7*  ALBUMIN 2.4*  --  2.4*  --   CALCIUM 8.5*  --  8.6*  --   PHOS 6.9*  --  8.0*  --   CREATININE 5.56*  --  7.34*  --   K 4.3  --  4.1  --    No results for input(s): "IRON", "TIBC", "FERRITIN" in the last 168 hours. Inpatient medications:  acetaminophen  1,000 mg Oral Q6H   Or   acetaminophen (TYLENOL) oral liquid 160 mg/5 mL  1,000 mg Per Tube Q6H   allopurinol  100 mg Oral Daily   apixaban  5 mg  Oral BID   aspirin EC  81 mg Oral Daily   Or   aspirin  81 mg Per Tube Daily   bisacodyl  10 mg Oral Daily   Or   bisacodyl  10 mg Rectal Daily   budesonide  0.25 mg Nebulization BID   Chlorhexidine Gluconate Cloth  6 each Topical Daily   Chlorhexidine Gluconate Cloth  6 each Topical Q0600   docusate sodium  200 mg Oral Daily   dolutegravir  50 mg Oral Daily   doxercalciferol  2 mcg Intravenous Q T,Th,Sa-HD   emtricitabine-tenofovir AF  1 tablet Oral Daily   ferric citrate  210 mg Oral TID WC   insulin aspart  0-24 Units Subcutaneous TID WC   levothyroxine  175 mcg Oral Daily   magic mouthwash  5 mL Oral TID   metoprolol tartrate  12.5 mg Oral BID   Or   metoprolol tartrate  12.5 mg Per Tube BID   minocycline  100 mg Oral Daily   pantoprazole  40 mg Oral Daily  rosuvastatin  10 mg Oral Daily   sodium chloride flush  3 mL Intravenous Q12H    heparin 1,200 Units/hr (08/30/23 1421)   dextrose, methocarbamol (ROBAXIN) injection, metoprolol tartrate, ondansetron (ZOFRAN) IV, mouth rinse, sodium chloride flush, traMADol

## 2023-08-30 NOTE — Progress Notes (Addendum)
PHARMACY - ANTICOAGULATION CONSULT NOTE  Pharmacy Consult for heparin> apixban Indication: DVT  Patient Measurements: Height: 5\' 5"  (165.1 cm) Weight: 81.6 kg (179 lb 12.8 oz) IBW/kg (Calculated) : 57 Heparin Dosing Weight: ~75kg  Vital Signs: Temp: 97.6 F (36.4 C) (11/13 1130) Temp Source: Oral (11/13 1130) BP: 142/74 (11/13 1200) Pulse Rate: 72 (11/13 1200)  Labs: Recent Labs    08/28/23 0203 08/28/23 0919 08/28/23 1451 08/29/23 0615 08/29/23 0919 08/29/23 0946 08/30/23 0242 08/30/23 1219  HGB 10.1*  --   --   --  9.9*  --  9.7*  --   HCT 30.9*  --   --   --  31.3*  --  30.3*  --   PLT 129*  --   --   --  167  --  176  --   APTT 35 39* 33 43*  --   --   --   --   LABPROT 16.6* 16.8* 17.3* 17.6*  --   --   --   --   INR 1.3* 1.3* 1.4* 1.4*  --   --   --   --   HEPARINUNFRC  --   --   --   --   --   --  0.55 0.77*  CREATININE 5.56*  --   --   --   --  7.34*  --   --     Estimated Creatinine Clearance: 9.1 mL/min (A) (by C-G formula based on SCr of 7.34 mg/dL (H)).  Assessment: 57 yo female found to have acute DVT in LUE involving LIJ, L brachial veins, L axillary vein, L subclavian vein. 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 No AC PTA.   -heparin level= 0.77 on 1300 units/hr -plans are to change to apixaban today (cost= $0)   Goal of Therapy:  Heparin level 0.3-0.7 units/ml Monitor platelets by anticoagulation protocol: Yes   Plan:  -Decrease heparin to 1200 units/hr -No further heparin levels -Start apixaban tonight: with recent procedures plans are to skip the loading dose (Discussed with Vascular service> start apixaban 5mg  po bid)  Harland German, PharmD Clinical Pharmacist **Pharmacist phone directory can now be found on [email or web address] ([email or web address]).  Listed under [facility].

## 2023-08-30 NOTE — Progress Notes (Signed)
Pt receives out-pt HD at [facility] [place] on MWF. Will assist as needed.   Olivia Canter Renal Navigator [phone]

## 2023-08-30 NOTE — Progress Notes (Signed)
PHARMACY - ANTICOAGULATION CONSULT NOTE  Pharmacy Consult for heparin Indication: DVT  Patient Measurements: Height: 5\' 5"  (165.1 cm) Weight: 84 kg (185 lb 3 oz) IBW/kg (Calculated) : 57 Heparin Dosing Weight: ~75kg  Vital Signs: Temp: 97.9 F (36.6 C) (11/13 0000) Temp Source: Oral (11/13 0000) BP: 109/54 (11/13 0300) Pulse Rate: 70 (11/13 0300)  Labs: Recent Labs    08/27/23 1000 08/27/23 1620 08/28/23 0203 08/28/23 0919 08/28/23 1451 08/29/23 0615 08/29/23 0919 08/29/23 0946 08/30/23 0242  HGB 11.1*  --  10.1*  --   --   --  9.9*  --  9.7*  HCT 34.1*  --  30.9*  --   --   --  31.3*  --  30.3*  PLT 155  --  129*  --   --   --  167  --  176  APTT 32   < > 35 39* 33 43*  --   --   --   LABPROT 15.0   < > 16.6* 16.8* 17.3* 17.6*  --   --   --   INR 1.2   < > 1.3* 1.3* 1.4* 1.4*  --   --   --   HEPARINUNFRC  --   --   --   --   --   --   --   --  0.55  CREATININE 4.03*  --  5.56*  --   --   --   --  7.34*  --    < > = values in this interval not displayed.    Estimated Creatinine Clearance: 9.3 mL/min (A) (by C-G formula based on SCr of 7.34 mg/dL (H)).  Assessment: 57 yo female found to have acute DVT in LUE involving LIJ, L brachial veins, L axillary vein, L subclavian vein. 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 No AC PTA. Has been on [facility] for VTE ppx w/ last dose 11/12 @ 1400.   11/13 AM: heparin level returned at 0.55 on 1300 units/hr (therapeutic). Per RN, no signs/symptoms of bleeding or issues with the heparin infusion running. Hgb 9s, plts 167>176.  Goal of Therapy:  Heparin level 0.3-0.7 units/ml Monitor platelets by anticoagulation protocol: Yes   Plan:  No bolus given recent TEVAR and Type B Dissection Continue heparin at 1300 units/hr Check heparin level in 8 hours Monitor daily heparin level, CBC F/u oral anticoag  Arabella Merles, PharmD. Clinical Pharmacist 08/30/2023 3:50 AM

## 2023-08-30 NOTE — Progress Notes (Signed)
Pt received from 2H.   CCMD notified with box [id number] box.   New IV in R arm.  L arm access discontinued and wrapped per order for DVT New Heparin bag started. Oriented to room.  Call bell within reach.

## 2023-08-30 NOTE — Progress Notes (Signed)
5 Days Post-Op Procedure(s) (LRB): AORTIC ARCH DEBRANCHING USING 12X8X8MM HEMASHIELD GRAFT (N/A) TRANSESOPHAGEAL ECHOCARDIOGRAM (N/A) THORACIC AORTIC ENDOVASCULAR STENT GRAFT (N/A) ULTRASOUND GUIDANCE FOR VASCULAR ACCESS, RIGHT FEMORAL ARTERY (Right) Subjective: Complains of some pain in right ankle and left arm that woke her up this am.  Sitting up in chair.  Objective: Vital signs in last 24 hours: Temp:  [97.6 F (36.4 C)-98.6 F (37 C)] 97.9 F (36.6 C) (11/13 0000) Pulse Rate:  [70-103] 103 (11/13 0600) Cardiac Rhythm: Normal sinus rhythm (11/13 0400) Resp:  [10-20] 17 (11/13 0600) BP: (104-163)/(52-126) 157/88 (11/13 0500) SpO2:  [79 %-96 %] 91 % (11/13 0600) Weight:  [81.6 kg-84 kg] 81.6 kg (11/13 0600)  Hemodynamic parameters for last 24 hours:    Intake/Output from previous day: 11/12 0701 - 11/13 0700 In: 717.2 [P.O.:600; I.V.:117.2] Out: 1300  Intake/Output this shift: No intake/output data recorded.  General appearance: alert and cooperative Neurologic: intact Heart: regular rate and rhythm Lungs: clear to auscultation bilaterally Extremities: left upper arm swelling compared to right. Nontender. Right ankle not swollen or red, nontender. Wound: incision healing well  Lab Results: Recent Labs    08/29/23 0919 08/30/23 0242  WBC 11.5* 9.2  HGB 9.9* 9.7*  HCT 31.3* 30.3*  PLT 167 176   BMET:  Recent Labs    08/28/23 0203 08/29/23 0946  NA 134* 133*  K 4.3 4.1  CL 97* 95*  CO2 21* 23  GLUCOSE 97 118*  BUN 42* 70*  CREATININE 5.56* 7.34*  CALCIUM 8.5* 8.6*    PT/INR:  Recent Labs    08/29/23 0615  LABPROT 17.6*  INR 1.4*   ABG    Component Value Date/Time   PHART 7.376 08/26/2023 1412   HCO3 20.9 08/26/2023 1412   TCO2 22 08/26/2023 1412   ACIDBASEDEF 4.0 (H) 08/26/2023 1412   O2SAT 92 08/26/2023 1412   CBG (last 3)  Recent Labs    08/29/23 1618 08/29/23 2131 08/30/23 0607  GLUCAP 72 118* 75    Assessment/Plan: S/P  Procedure(s) (LRB): AORTIC ARCH DEBRANCHING USING 12X8X8MM HEMASHIELD GRAFT (N/A) TRANSESOPHAGEAL ECHOCARDIOGRAM (N/A) THORACIC AORTIC ENDOVASCULAR STENT GRAFT (N/A) ULTRASOUND GUIDANCE FOR VASCULAR ACCESS, RIGHT FEMORAL ARTERY (Right)  POD 5  Hemodynamically stable in sinus rhythm. Continue low dose Lopressor.  Wt coming down with HD. Still 7 lbs over preop.  Left internal jugular, subclavian, axillary and brachial DVT. Continue heparin and transition to oral anticoagulant of choice per vascular surgery.  PT/OT  IS  Transfer to  4E.   LOS: 5 days    [name] 08/30/2023

## 2023-08-30 NOTE — Discharge Instructions (Signed)
Information on my medicine - ELIQUIS (apixaban)  Why was Eliquis prescribed for you? Eliquis was prescribed to treat blood clots that may have been found in the veins of your legs (deep vein thrombosis) or in your lungs (pulmonary embolism) and to reduce the risk of them occurring again.  What do You need to know about Eliquis ? The dose is one 5 mg tablet taken TWICE daily.  Eliquis may be taken with or without food.   Try to take the dose about the same time in the morning and in the evening. If you have difficulty swallowing the tablet whole please discuss with your pharmacist how to take the medication safely.  Take Eliquis exactly as prescribed and DO NOT stop taking Eliquis without talking to the doctor who prescribed the medication.  Stopping may increase your risk of developing a new blood clot.  Refill your prescription before you run out.  After discharge, you should have regular check-up appointments with your healthcare provider that is prescribing your Eliquis.    What do you do if you miss a dose? If a dose of ELIQUIS is not taken at the scheduled time, take it as soon as possible on the same day and twice-daily administration should be resumed. The dose should not be doubled to make up for a missed dose.  Important Safety Information A possible side effect of Eliquis is bleeding. You should call your healthcare provider right away if you experience any of the following: Bleeding from an injury or your nose that does not stop. Unusual colored urine (red or dark brown) or unusual colored stools (red or black). Unusual bruising for unknown reasons. A serious fall or if you hit your head (even if there is no bleeding).  Some medicines may interact with Eliquis and might increase your risk of bleeding or clotting while on Eliquis. To help avoid this, consult your healthcare provider or pharmacist prior to using any new prescription or non-prescription medications,  including herbals, vitamins, non-steroidal anti-inflammatory drugs (NSAIDs) and supplements.  This website has more information on Eliquis (apixaban): http://www.eliquis.com/eliquis/home  

## 2023-08-30 NOTE — Progress Notes (Addendum)
Progress Note    08/30/2023 8:50 AM 5 Days Post-Op  Subjective:  feels happy. Had a BM yesterday evening and this morning. Left arm feels a little sore    Vitals:   08/30/23 0600 08/30/23 0841  BP:    Pulse: (!) 103 77  Resp: 17 12  Temp:    SpO2: 91% 94%    Physical Exam: General:  sitting up in the chair eating breakfast Cardiac:  regular Lungs:  nonlabored Incisions:  midline chest incision healing appropriately Extremities:  palpable left radial pulse. LUE still edematous. Palpable DP pulses bilaterally  CBC    Component Value Date/Time   WBC 9.2 08/30/2023 0242   RBC 3.29 (L) 08/30/2023 0242   HGB 9.7 (L) 08/30/2023 0242   HGB 11.7 (L) 07/27/2021 1110   HCT 30.3 (L) 08/30/2023 0242   PLT 176 08/30/2023 0242   PLT 228 07/27/2021 1110   MCV 92.1 08/30/2023 0242   MCH 29.5 08/30/2023 0242   MCHC 32.0 08/30/2023 0242   RDW 16.5 (H) 08/30/2023 0242   LYMPHSABS 1.0 08/28/2023 0203   MONOABS 1.2 (H) 08/28/2023 0203   EOSABS 0.0 08/28/2023 0203   BASOSABS 0.0 08/28/2023 0203    BMET    Component Value Date/Time   NA 133 (L) 08/29/2023 0946   NA 142 12/05/2022 0000   K 4.1 08/29/2023 0946   CL 95 (L) 08/29/2023 0946   CO2 23 08/29/2023 0946   GLUCOSE 118 (H) 08/29/2023 0946   BUN 70 (H) 08/29/2023 0946   BUN 44 (A) 12/05/2022 0000   CREATININE 7.34 (H) 08/29/2023 0946   CREATININE 2.72 (H) 07/27/2021 1110   CREATININE 2.74 (H) 03/17/2021 1050   CALCIUM 8.6 (L) 08/29/2023 0946   GFRNONAA 6 (L) 08/29/2023 0946   GFRNONAA 20 (L) 07/27/2021 1110   GFRAA 39 (L) 07/14/2020 1053   GFRAA 33 (L) 08/12/2019 1447    INR    Component Value Date/Time   INR 1.4 (H) 08/29/2023 0615     Intake/Output Summary (Last 24 hours) at 08/30/2023 0850 Last data filed at 08/30/2023 0600 Gross per 24 hour  Intake 717.15 ml  Output 1300 ml  Net -582.85 ml      Assessment/Plan:  57 y.o. female is 5 days post op, s/p: aortic arch debranching with TEVAR     -Feeling pretty good this morning. Has some left arm soreness -Tolerating a normal diet and has had several Bms -BLE warm and well perfused with palpable DP pulses -Left arm is edematous. Duplex yesterday demonstrated acute DVT in the left internal jugular, brachial, axillary, and subclavian veins. Likely due to trauma and compression during sugery. Currently on heparin -Okay to transition to oral anticoagulation, either Eliquis or Xarelto pending affordability. Appreciate pharmacy assistance   Loel Dubonnet, [facility] [phone] 08/30/2023 8:50 AM  VASCULAR STAFF ADDENDUM: I have independently interviewed and examined the patient. I agree with the above.  Patient seen and examined today.  Overall.  Peers to be doing well.  States she is tired, has not been sleeping well Left arm with edema as compared to right.  On heparin for left upper extremity diffuse DVT.  Not extremely symptomatic, most notable is just the swelling. Please continue heparin for the time being.  Plan to discuss mechanical thrombectomy with my partners and Dr. Laneta Simmers. Of out of bed as tolerated.  Overall progressing well.  Victorino Sparrow MD [facility] Phone Number: [phone] 08/30/2023  4:34 PM

## 2023-08-30 NOTE — TOC Benefit Eligibility Note (Signed)
Patient Product/process development scientist completed.    The patient is insured through [employer]. Patient has Medicare and is not eligible for a copay card, but may be able to apply for patient assistance, if available.    Ran test claim for Eliquis 5 mg and the current 30 day co-pay is $0.00.   This test claim was processed through [facility]- copay amounts may vary at other pharmacies due to pharmacy/plan contracts, or as the patient moves through the different stages of their insurance plan.     Roland Earl, CPHT Pharmacy Technician III Certified Patient Advocate [name] Patient Advocate Team Direct Number: [phone]  Fax: [phone]

## 2023-08-31 ENCOUNTER — Encounter (HOSPITAL_COMMUNITY): Payer: Self-pay | Admitting: Surgery

## 2023-08-31 LAB — GLUCOSE, CAPILLARY
Glucose-Capillary: 76 mg/dL (ref 70–99)
Glucose-Capillary: 107 mg/dL — ABNORMAL HIGH (ref 70–99)
Glucose-Capillary: 119 mg/dL — ABNORMAL HIGH (ref 70–99)
Glucose-Capillary: 182 mg/dL — ABNORMAL HIGH (ref 70–99)

## 2023-08-31 LAB — CBC
HCT: 32.6 % — ABNORMAL LOW (ref 36.0–46.0)
Hemoglobin: 10.4 g/dL — ABNORMAL LOW (ref 12.0–15.0)
MCH: 29.8 pg (ref 26.0–34.0)
MCHC: 31.9 g/dL (ref 30.0–36.0)
MCV: 93.4 fL (ref 80.0–100.0)
Platelets: 196 10*3/uL (ref 150–400)
RBC: 3.49 MIL/uL — ABNORMAL LOW (ref 3.87–5.11)
RDW: 16.4 % — ABNORMAL HIGH (ref 11.5–15.5)
WBC: 8.9 10*3/uL (ref 4.0–10.5)
nRBC: 0 % (ref 0.0–0.2)

## 2023-08-31 MED ORDER — CARVEDILOL 6.25 MG PO TABS
6.2500 mg | ORAL_TABLET | Freq: Two times a day (BID) | ORAL | Status: DC
  Administered 2023-08-31 – 2023-09-03 (×5): 6.25 mg via ORAL
  Filled 2023-08-31 (×5): qty 1

## 2023-08-31 MED ORDER — LOSARTAN POTASSIUM 50 MG PO TABS
50.0000 mg | ORAL_TABLET | Freq: Every day | ORAL | Status: DC
  Administered 2023-08-31 – 2023-09-03 (×4): 50 mg via ORAL
  Filled 2023-08-31 (×4): qty 1

## 2023-08-31 MED ORDER — CHLORHEXIDINE GLUCONATE CLOTH 2 % EX PADS: 6.0000 | MEDICATED_PAD | Freq: Every day | CUTANEOUS | Status: DC

## 2023-08-31 MED FILL — Lidocaine HCl Local Preservative Free (PF) Inj 2%: INTRAMUSCULAR | Qty: 14 | Status: AC

## 2023-08-31 MED FILL — Heparin Sodium (Porcine) Inj 1000 Unit/ML: Qty: 1000 | Status: AC

## 2023-08-31 MED FILL — Potassium Chloride Inj 2 mEq/ML: INTRAVENOUS | Qty: 40 | Status: AC

## 2023-08-31 NOTE — Progress Notes (Signed)
   08/31/23 1141  Vitals  Temp 98 F (36.7 C)  Pulse Rate 89  Resp 12  BP 108/78  SpO2 97 %  O2 Device Nasal Cannula  Weight 85 kg  Type of Weight Post-Dialysis  Oxygen Therapy  O2 Flow Rate (L/min) 5 L/min  Patient Activity (if Appropriate) In bed  Post Treatment  Dialyzer Clearance Clear  Hemodialysis Intake (mL) 0 mL  Liters Processed 72.1  Fluid Removed (mL) 3000 mL  Tolerated HD Treatment Yes   Received patient in bed to unit.  Alert and oriented.  Informed consent signed and in chart.   TX duration:3.25hrs  Patient tolerated well.  Transported back to the room  Alert, without acute distress.  Hand-off given to patient's nurse.   Access used: [facility] Access issues: none  Total UF removed: 3L Medication(s) given: none BS 107 checked per pt request    [facility]

## 2023-08-31 NOTE — Progress Notes (Signed)
[facility] Progress Note  Subjective: seen in room. Had HD this am w/ 3 L off.   Vitals:   08/31/23 1130 08/31/23 1141 08/31/23 1145 08/31/23 1223  BP: 113/83 108/78 125/78 (!) 158/79  Pulse: 98 89 99 82  Resp: 16 12 16 15   Temp:  98 F (36.7 C)  98.3 F (36.8 C)  TempSrc:    Oral  SpO2: 96% 97% 96% 100%  Weight:  85 kg    Height:        Exam: Gen: Awake/alert and responsive, on RA CVS: RRR, no RG Resp: Anteriorly clear to auscultation Abd: Soft, obese, nontender, bowel sounds normal Ext: No lower extremity edema  HD access: Right IJ TDC intact   Renal-related home meds - norvasc 10 - coreg 25 bid - auryxia 1 ac tid - losartan 50 every day    OP HD: MWF SW  4h  400/600   80.1kg  2/2 baht  RIJ TDC  Heparin 5000 - hectorol 2 mcg    CXR 11/12- clear CXR    Assessment/ Plan: Status post thoracic aortic endovascular graft placement: 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 Off pressors and CT's removed yesterday.  ESKD: usual HD MWF. Had HD here early am on Sunday, then again on 11/12 off schedule. HD today off schedule. HD tomorrow (mwf).  Volume: up 5kg by wts, not sure accurate. Last CXR clear on 11/12. Tolerated 3 L UF today. UF 2-3 L w/ HD as tol tomorrow.  Anemia eskd: Hb stable now in 9- 11 range. Sp prbc's.  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 Cont IV vdra for PTH control tiw. HIV infection: Antiretroviral therapy resumed. BP: getting metoprolol 12.5 bid here, BP's are good.      Vinson Moselle MD  [facility] 08/31/2023, 4:01 PM  Recent Labs  Lab 08/28/23 0203 08/29/23 0919 08/29/23 0946 08/30/23 0242 08/31/23 0511  HGB 10.1*   < >  --  9.7* 10.4*  ALBUMIN 2.4*  --  2.4*  --   --   CALCIUM 8.5*  --  8.6*  --   --   PHOS 6.9*  --  8.0*  --   --   CREATININE 5.56*  --  7.34*  --   --   K 4.3  --  4.1  --   --    < > = values in this interval not displayed.   No results for input(s): "IRON",  "TIBC", "FERRITIN" in the last 168 hours. Inpatient medications:  allopurinol  100 mg Oral Daily   apixaban  5 mg Oral BID   aspirin EC  81 mg Oral Daily   Or   aspirin  81 mg Per Tube Daily   bisacodyl  10 mg Oral Daily   Or   bisacodyl  10 mg Rectal Daily   budesonide  0.25 mg Nebulization BID   carvedilol  6.25 mg Oral BID WC   Chlorhexidine Gluconate Cloth  6 each Topical Daily   Chlorhexidine Gluconate Cloth  6 each Topical Q0600   Chlorhexidine Gluconate Cloth  6 each Topical Q0600   docusate sodium  200 mg Oral Daily   dolutegravir  50 mg Oral Daily   doxercalciferol  2 mcg Intravenous Q T,Th,Sa-HD   emtricitabine-tenofovir AF  1 tablet Oral Daily   ferric citrate  210 mg Oral TID WC   insulin aspart  0-24 Units Subcutaneous TID WC   levothyroxine  175 mcg Oral Daily   losartan  50 mg Oral Daily   magic mouthwash  5 mL Oral TID   minocycline  100 mg Oral Daily   pantoprazole  40 mg Oral Daily   rosuvastatin  10 mg Oral Daily   sodium chloride flush  3 mL Intravenous Q12H     dextrose, methocarbamol (ROBAXIN) injection, metoprolol tartrate, ondansetron (ZOFRAN) IV, mouth rinse, sodium chloride flush, traMADol

## 2023-08-31 NOTE — Discharge Summary (Signed)
[address]       [place] [address]             [phone]    Physician Discharge Summary  Patient ID: [name] MRN: [id number] DOB/AGE: [birth year] 57 y.o.  Admit date: 08/25/2023 Discharge date: 09/03/2023  Admission Diagnoses:  Patient Active Problem List   Diagnosis Date Noted   Acute pain of right shoulder due to trauma 06/08/2023   Thoracic back pain 03/07/2023   At risk for sleep apnea 03/07/2023   Hypothyroidism 02/13/2023   Malnutrition of moderate degree (HCC) 02/08/2023   Aortic dissection (HCC) 01/29/2023   Bilateral low back pain without sciatica 08/29/2022   Chronic pain of both knees 07/29/2022   Chronic illness 10/27/2021   Paresthesias 06/30/2021   Chronic pain of right hand 06/30/2021   Chronic low back pain 06/30/2021   Voice hoarseness 12/15/2020   Recurrent falls 11/05/2020   Impingement syndrome of left shoulder region 05/27/2020   Inclusion cyst 05/01/2020   Postoperative breast asymmetry 02/28/2020   Cognitive dysfunction 02/21/2020   Chronic gout 01/27/2020   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   Dysphagia 07/23/2019   CKD (chronic kidney disease) stage V requiring chronic dialysis (HCC) 07/12/2019   Normocytic anemia 07/12/2019   Preventative health care 09/10/2018   Port-A-Cath in place 07/12/2018   Family history of breast cancer    Family history of prostate cancer    Family history of lung cancer    Malignant neoplasm of lower-inner quadrant of right breast of female, estrogen receptor positive (HCC) 05/10/2018   Hyperlipidemia 04/26/2017   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   GAD (generalized anxiety disorder) 04/11/2017   Laryngopharyngeal reflux (LPR) 11/24/2016   Obesity (BMI 30-39.9) 10/14/2016   Allergic rhinitis 02/18/2016   Type 2 diabetes mellitus with hyperlipidemia (HCC) 06/23/2015   Recurrent boils 06/11/2014   Hidradenitis suppurativa 01/30/2012    Sarcoidosis 02/08/2007   Cigarette smoker 02/08/2007   HIV (human immunodeficiency virus infection) (HCC) 07/24/2006   Depression 07/24/2006   Hypertension 07/24/2006   Asthma 07/24/2006   Discharge Diagnoses:  Patient Active Problem List   Diagnosis Date Noted   DVT of upper extremity (deep vein thrombosis) (HCC) 09/03/2023   H/O aortic aneurysm repair 08/25/2023   Acute pain of right shoulder due to trauma 06/08/2023   Thoracic back pain 03/07/2023   At risk for sleep apnea 03/07/2023   Hypothyroidism 02/13/2023   Malnutrition of moderate degree (HCC) 02/08/2023   Aortic dissection (HCC) 01/29/2023   Bilateral low back pain without sciatica 08/29/2022   Chronic pain of both knees 07/29/2022   Chronic illness 10/27/2021   Paresthesias 06/30/2021   Chronic pain of right hand 06/30/2021   Chronic low back pain 06/30/2021   Voice hoarseness 12/15/2020   Recurrent falls 11/05/2020   Impingement syndrome of left shoulder region 05/27/2020   Inclusion cyst 05/01/2020   Postoperative breast asymmetry 02/28/2020   Cognitive dysfunction 02/21/2020   Chronic gout 01/27/2020   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   Dysphagia 07/23/2019   CKD (chronic kidney disease) stage V requiring chronic dialysis (HCC) 07/12/2019   Normocytic anemia 07/12/2019   Preventative health care 09/10/2018   Port-A-Cath in place 07/12/2018   Family history of breast cancer    Family history of prostate cancer    Family history of lung cancer    Malignant neoplasm of lower-inner quadrant of right breast of female, estrogen receptor  positive (HCC) 05/10/2018   Hyperlipidemia 04/26/2017   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   GAD (generalized anxiety disorder) 04/11/2017   Laryngopharyngeal reflux (LPR) 11/24/2016   Obesity (BMI 30-39.9) 10/14/2016   Allergic rhinitis 02/18/2016   Type 2 diabetes mellitus with hyperlipidemia (HCC) 06/23/2015   Recurrent boils  06/11/2014   Hidradenitis suppurativa 01/30/2012   Sarcoidosis 02/08/2007   Cigarette smoker 02/08/2007   HIV (human immunodeficiency virus infection) (HCC) 07/24/2006   Depression 07/24/2006   Hypertension 07/24/2006   Asthma 07/24/2006   Discharged Condition: good  HPI: This is a 57 year old woman with history of diabetes, hypertension, end-stage renal disease on hemodialysis, hypothyroidism due to Graves' disease, HIV on antiviral therapy, 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  Dr. Sherral Hammers reviewed the scans and felt that placement of another endovascular stent would first require de-branching of the aortic arch to allow the stent graft to cross the entire aortic arch with an adequate proximal landing zone.   The patient tells me that she has had no chest or back pain.  She has been doing well with dialysis.  She does report some vague symptoms in her lower extremities that she describes as severe pain and inability to move her extremities.  She said this is not related to any specific activity and not related to dialysis.  She is still able to stand on her legs but does not feel like she can move.  She usually has to hold onto something or sit down.  It involves both legs.  She reports the pain as being the whole way up and down her legs.  Dr. Laneta Simmers agreed with Dr. Sherral Hammers (Vascular Surgeon) that the best  option is to proceed with aortic arch de-branching via median sternotomy with bypasses to the innominate and left common carotid arteries followed by stent grafting across the aortic arch.  Dr. Sherral Hammers will then decide whether further intervention is needed for the distal portion of the stent graft and the retrograde filling of the false lumen. Potential risks, benefits, and complications of the surgery were discussed with the patient and she agreed to proceed with surgery.  Hospital Course: Ms. Dulong was taken to the operating room on 08/25/2023 where aortic arch deep branching along with aortic innominate artery, aorta and left carotid artery, and aorta left subclavian artery bypasses were performed by Dr. Laneta Simmers and Dr. Karin Lieu.  Following the procedure, she was transported to the surgical ICU in stable condition.  The nephrology team was consulted early postoperatively for continuity of care for her end-stage renal disease and resumption of dialysis.  Patient was weaned from ventilator support and extubated by around 12 noon on the first postoperative day.  Hemodialysis was resumed on postop day 2.  The lumbar drain was removed on postop day 2.  Her chest tubes were removed without difficulty.  She is deconditioned and was evaluated by PT/OT.  They  recommended home health PT/OT.  She is maintaining NSR.  Her neck line was removed on 08/29/2023.  She was felt stable for transfer to the progressive care unit on 08/30/2023.  She developed DVT in the left internal jugular, left brachial, axillary, and subclavian veins felt to be related trauma/compression of brachiocephalic vein from surgery and branch grafts running under the vein.  She was started on Heparin and will be transitioned to Apixiban.  She is diuresing well and her weight has returned to baseline.  She has some pain along her back/shoulder blades which is not unexpected with procedure she underwent.  Her surgical incisions are healing without evidence  of infection.  She is stable for discharge for home today.  Consults: vascular surgery  Significant Diagnostic Studies:   IMPRESSION: 1. Interval change in the appearance and configuration of the most proximal thoracic aortic stent graft. The stent graft has expanded from 2.6 cm in diameter in April of 2024 to 4.7 cm in diameter on today's examination. Additionally, there is a new focal outpouching of contrast material along the aortic infundibulum at the proximal landing zone concerning for ulceration/pseudoaneurysm development. 2. Persistent retrograde filling of the false lumen with mildly increased size of the descending thoracic aorta to 4.8 cm. 3. Similarly, there is a mild enlargement of the visceral aorta now measuring up to 3.6 cm compared to 3.2 cm previously at the origin of the celiac artery. 4. Similar extent of abdominal aortic dissection flap extending through the iliac system on the left and into the common femoral artery. 5. Slight interval improvement in the appearance of the dissection involvement of the left renal artery. There is persistent stenosis but the artery remains patent and the flow channel seems to be improving. 6. Additional ancillary findings as above without interval change.   These results were called by telephone at the time of interpretation on 05/09/2023 at 9:13 am to provider Dr. Lerry Paterson, Who verbally acknowledged these results.   Signed,   Sterling Big, MD, RPVI   [facility]   [facility]     Electronically Signed   By: Malachy Moan M.D.   On: 05/09/2023 09:13   Treatments: surgery:   08/25/2023   Co-surgeons:  [name], MD and Victorino Sparrow, MD   Preoperative Diagnosis:  Aortic arch aneurysm, s/p TEVAR for type B aortic dissection.   Postoperative Diagnosis:  Same     Procedure:   Median Sternotomy 2.   Aortic arch de-branching with   aorta-innominate artery,  aorta-left carotid artery and aorta-left subclavian artery bypasses using a 30 x 12 x 8 x 8 mm Hemashield Platinum graft.  3.   Thoracic aortic endovascular stent graft.   Median sternotomy Aortic arch to branching with aorta innominate artery, aorta left carotid artery, left subclavian artery bypasses using Trifurcated 12mm x 8mm x 8mm Hemashield platinum graft Ultrasound-guided micropuncture access of the right common femoral artery in retrograde fashion Arch angiogram TEVAR Cook alpha 36mm x 113cm, 40mm x 117cm, 46mm x 179cm Device assisted closure-Pro-glide   Co-SURGEONS: Wandalee Ferdinand MD   Discharge Exam: Blood pressure (!) 143/67, pulse 80, temperature 98 F (36.7 C), temperature source Oral, resp. rate 11, height 5\' 5"  (1.651 m), weight 78.7 kg, last menstrual period 03/31/2014, SpO2 99%.  General appearance: alert, cooperative, and no distress Heart: regular rate and rhythm Lungs: clear to auscultation bilaterally Abdomen: soft, non-tender; bowel sounds normal; no masses,  no  organomegaly Extremities: edema none present  Wound: clean and dry  Discharge Medications:  The patient has been discharged on:   1.Beta Blocker:  Yes [ X  ]                              No   [   ]                              If No, reason:  2.Ace Inhibitor/ARB: Yes [ X  ]                                     No  [    ]                                     If No, reason:  3.Statin:   Yes [  X ]                  No  [   ]                  If No, reason:  4.Ecasa:  Yes  [  X ]                  No   [   ]                  If No, reason:  Patient had ACS upon admission: No  Plavix/P2Y12 inhibitor: Yes [   ]                                      No  Arly.Keller   ]   Allergies as of 09/03/2023       Reactions   Genvoya [elviteg-cobic-emtricit-tenofaf] Hives   Lisinopril Cough   Aldactone [spironolactone] Hives   Tegaderm Ag Mesh [silver] Itching         Medication List     TAKE these medications    acetaminophen 500 MG tablet Commonly known as: TYLENOL Take 1,000 mg by mouth every 6 (six) hours as needed for mild pain.   albuterol 108 (90 Base) MCG/ACT inhaler Commonly known as: VENTOLIN HFA Inhale 2 puffs into the lungs every 6 (six) hours as needed for wheezing or shortness of breath.   allopurinol 100 MG tablet Commonly known as: ZYLOPRIM TAKE 1 TABLET BY MOUTH EVERY DAY FOR GOUT PREVENTION   amLODipine 5 MG tablet Commonly known as: NORVASC Take 10 mg by mouth daily.   apixaban 5 MG Tabs tablet Commonly known as: ELIQUIS Take 1 tablet (5 mg total) by mouth 2 (two) times daily.   Arnuity Ellipta 100 MCG/ACT Aepb Generic drug: Fluticasone Furoate Inhale 1 puff into the lungs daily.   aspirin 81 MG chewable tablet Chew 81 mg by mouth daily.   Auryxia 1 GM 210 MG(Fe) tablet Generic drug: ferric citrate Take 210 mg by mouth 3 (three) times daily with meals.   carvedilol 6.25 MG tablet Commonly known as: COREG Take 1 tablet (6.25 mg total) by mouth 2 (two) times daily with a meal. What changed:  medication  strength how much to take when to take this   Descovy 200-25 MG tablet Generic drug: emtricitabine-tenofovir AF Take 1 tablet by mouth daily.   fluticasone 110 MCG/ACT inhaler Commonly known as: FLOVENT HFA Inhale 1 puff into the lungs 2 (two) times daily.   FreeStyle Lite w/Device Kit Inject 1 each into the skin daily.   levocetirizine 5 MG tablet Commonly known as: XYZAL Take 1 tablet (5 mg total) by mouth every evening. For allergies   levothyroxine 175 MCG tablet Commonly known as: SYNTHROID TAKE 1 TABLET BY MOUTH EVERY DAY   losartan 50 MG tablet Commonly known as: COZAAR Take 1 tablet (50 mg total) by mouth daily. for blood pressure.   methocarbamol 500 MG tablet Commonly known as: ROBAXIN Take 1 tablet (500 mg total) by mouth every 6 (six) hours as needed for muscle spasms.    metoCLOPramide 5 MG tablet Commonly known as: Reglan Take 1 tablet (5 mg total) by mouth every 6 (six) hours as needed for nausea.   minocycline 100 MG tablet Commonly known as: DYNACIN Take 1 tablet (100 mg total) by mouth 2 (two) times daily. What changed: when to take this   omeprazole 40 MG capsule Commonly known as: PRILOSEC Take 1 capsule (40 mg total) by mouth 2 (two) times daily before a meal.   ondansetron 4 MG tablet Commonly known as: Zofran Take 1 tablet (4 mg total) by mouth daily as needed for nausea or vomiting.   onetouch ultrasoft lancets Use as instructed to test blood sugar daily   rosuvastatin 10 MG tablet Commonly known as: CRESTOR TAKE 1 TABLET BY MOUTH EVERY DAY FOR CHOLESTEROL   saxagliptin HCl 2.5 MG Tabs tablet Commonly known as: ONGLYZA Take 1 tablet (2.5 mg total) by mouth daily. for diabetes.   [name] 50 MG tablet Generic drug: dolutegravir Take 1 tablet (50 mg total) by mouth daily.   traMADol 50 MG tablet Commonly known as: ULTRAM Take 1 tablet (50 mg total) by mouth every 6 (six) hours as needed for moderate pain. What changed: Another medication with the same name was added. Make sure you understand how and when to take each.   traMADol 50 MG tablet Commonly known as: ULTRAM Take 1 tablet (50 mg total) by mouth every 6 (six) hours as needed for moderate pain (pain score 4-6). What changed: You were already taking a medication with the same name, and this prescription was added. Make sure you understand how and when to take each.               Durable Medical Equipment  (From admission, onward)           Start     Ordered   09/03/23 0920  For home use only DME 3 n 1  Once        09/03/23 0919   09/03/23 0919  For home use only DME Walker rolling  Once       Question Answer Comment  Walker: With 5 Inch Wheels   Patient needs a walker to treat with the following condition Physical deconditioning      09/03/23 0919             Follow-up Information     [name], MD Follow up on 09/27/2023.   Specialty: Cardiothoracic Surgery Why: Appointment is at 4:30 Contact information: [address] [place] [address] [phone]         Victorino Sparrow, MD  Follow up.   Specialty: Vascular Surgery Contact information: [address] [place] [address] [phone]         [facility] Follow up on 09/07/2023.   Specialty: Cardiothoracic Surgery Why: Appointment is at 10:30 for suture removal Contact information: [address] [place], [address] [place] [address] [phone]        [facility] Follow up on 09/27/2023.   Why: Please get CXR at 3:30 prior to your appointment with Dr. Cristy Hilts information: [address] [place] [address]                Signed:  Lowella Dandy, PA-C  09/03/2023, 9:23 AM

## 2023-08-31 NOTE — Progress Notes (Addendum)
      [address]       [place] [address]             [phone]      6 Days Post-Op Procedure(s) (LRB): AORTIC ARCH DEBRANCHING USING 12X8X8MM HEMASHIELD GRAFT (N/A) TRANSESOPHAGEAL ECHOCARDIOGRAM (N/A) THORACIC AORTIC ENDOVASCULAR STENT GRAFT (N/A) ULTRASOUND GUIDANCE FOR VASCULAR ACCESS, RIGHT FEMORAL ARTERY (Right)  Subjective:  Patient had a rough night.  She complains of increased pain along her upper back.  She also notes her left arm is swollen and it was worse with the ACE wrap in place.  Objective: Vital signs in last 24 hours: Temp:  [97.5 F (36.4 C)-98 F (36.7 C)] 97.8 F (36.6 C) (11/14 0333) Pulse Rate:  [70-94] 72 (11/14 0333) Cardiac Rhythm: Normal sinus rhythm (11/13 2100) Resp:  [11-20] 13 (11/14 0333) BP: (110-186)/(62-110) 164/90 (11/14 0333) SpO2:  [75 %-98 %] 98 % (11/14 0333)  Intake/Output from previous day: 11/13 0701 - 11/14 0700 In: 203.8 [I.V.:203.8] Out: -   General appearance: alert, cooperative, and no distress Heart: regular rate and rhythm Lungs: clear to auscultation bilaterally Abdomen: soft, non-tender; bowel sounds normal; no masses,  no organomegaly Extremities: edema trace, LUE edema Wound: clean and dry  Lab Results: Recent Labs    08/30/23 0242 08/31/23 0511  WBC 9.2 8.9  HGB 9.7* 10.4*  HCT 30.3* 32.6*  PLT 176 196   BMET:  Recent Labs    08/29/23 0946  NA 133*  K 4.1  CL 95*  CO2 23  GLUCOSE 118*  BUN 70*  CREATININE 7.34*  CALCIUM 8.6*    PT/INR:  Recent Labs    08/29/23 0615  LABPROT 17.6*  INR 1.4*   ABG    Component Value Date/Time   PHART 7.376 08/26/2023 1412   HCO3 20.9 08/26/2023 1412   TCO2 22 08/26/2023 1412   ACIDBASEDEF 4.0 (H) 08/26/2023 1412   O2SAT 92 08/26/2023 1412   CBG (last 3)  Recent Labs    08/30/23 1620 08/30/23 2114 08/31/23 0550  GLUCAP 121* 148* 76    Assessment/Plan: S/P Procedure(s) (LRB): AORTIC ARCH DEBRANCHING USING 12X8X8MM  HEMASHIELD GRAFT (N/A) TRANSESOPHAGEAL ECHOCARDIOGRAM (N/A) THORACIC AORTIC ENDOVASCULAR STENT GRAFT (N/A) ULTRASOUND GUIDANCE FOR VASCULAR ACCESS, RIGHT FEMORAL ARTERY (Right)  CV-NSR, HTN- will resume home Coreg, restart home Cozaar for additional BP control today Pulm- no acute issues, off oxygen, continue IS Renal- ESRD, HD scheduled for today, Nephrology following Left internal jugular, subclavian, axillary and brachial DVT.Marland Kitchen on Heparin gtt, per Vascular Deconditioning mild- PT/OT recs H/H    LOS: 6 days   Lowella Dandy, PA-C 08/31/2023   Chart reviewed, patient examined, agree with above. She feels ok this am. Expected incisional tightness/pain responding to pain meds. Her left arm looks ok with mild swelling and no tenderness. I think anticoagulation will take care of that. Transition to oral anticoagulant. HD today. She needs another day or two of ambulation before she goes home with her daughter.

## 2023-08-31 NOTE — Progress Notes (Signed)
PT Cancellation Note  Patient Details Name: [name] MRN: [id number] DOB: Aug 09, 1966   Cancelled Treatment:    Reason Eval/Treat Not Completed: Patient at procedure or test/unavailable (HD)   Kischa Altice B Jaeshaun Riva 08/31/2023, 9:08 AM Merryl Hacker, PT Acute Rehabilitation Services Office: [phone]

## 2023-08-31 NOTE — Progress Notes (Signed)
[facility] [place]  Subjective  - Back pain, left arm swelling slowly improving   Objective (!) 164/90 72 97.8 F (36.6 C) (Oral) 13 98%  Intake/Output Summary (Last 24 hours) at 08/31/2023 0809 Last data filed at 08/30/2023 2325 Gross per 24 hour  Intake 190.79 ml  Output --  Net 190.79 ml   Palpable radial pulses b Left UE compartments soft, motor intact Chest incision healing well Lungs non labored breathing Palpable DP pulse B LE   Assessment/Planning: 57 y.o. female is 6 days post op, s/p: aortic arch debranching with TEVAR   Left arm edema slowly improving, good skin lines and compressible compartments Apixaban has been started by Pharmacy 08/30/23 Mobility encouraged  [name] 08/31/2023 8:09 AM --  VASCULAR STAFF ADDENDUM: I have independently interviewed and examined the patient. I agree with the above.  Doing well Left arm soft. No plan for intervention. Can transition to cheapest DOAC, ASA. Plan to see in one month with CTA chest and carotid duplex ultrasound study.  Right upper extremity venous duplex for known DVT Plan will be for long term HD acces once she completes 3 month AC course.   Felicia Sparrow MD [facility] Phone Number: [phone] 08/31/2023 8:09 AM    Laboratory Lab Results: Recent Labs    08/30/23 0242 08/31/23 0511  WBC 9.2 8.9  HGB 9.7* 10.4*  HCT 30.3* 32.6*  PLT 176 196   BMET Recent Labs    08/29/23 0946  NA 133*  K 4.1  CL 95*  CO2 23  GLUCOSE 118*  BUN 70*  CREATININE 7.34*  CALCIUM 8.6*    COAG Lab Results  Component Value Date   INR 1.4 (H) 08/29/2023   INR 1.4 (H) 08/28/2023   INR 1.3 (H) 08/28/2023   No results found for: "PTT"

## 2023-08-31 NOTE — Plan of Care (Signed)
POC progressing.  

## 2023-08-31 NOTE — Progress Notes (Signed)
[facility] [place] [facility]  Subjective  - Back pain, left arm swelling slowly improving   Objective (!) 164/90 72 97.8 F (36.6 C) (Oral) 13 98%  Intake/Output Summary (Last 24 hours) at 08/31/2023 7564 Last data filed at 08/30/2023 2325 Gross per 24 hour  Intake 203.77 ml  Output --  Net 203.77 ml   Palpable radial pulses b Left UE compartments soft, motor intact Chest incision healing well Lungs non labored breathing Palpable DP pulse B LE   Assessment/Planning: 57 y.o. female is 6 days post op, s/p: aortic arch debranching with TEVAR   Left arm edema slowly improving, good skin lines and compressible compartments Apixaban has been started by Pharmacy 08/30/23 Mobility encouraged  Mosetta Pigeon 08/31/2023 7:42 AM --  Laboratory Lab Results: Recent Labs    08/30/23 0242 08/31/23 0511  WBC 9.2 8.9  HGB 9.7* 10.4*  HCT 30.3* 32.6*  PLT 176 196   BMET Recent Labs    08/29/23 0946  NA 133*  K 4.1  CL 95*  CO2 23  GLUCOSE 118*  BUN 70*  CREATININE 7.34*  CALCIUM 8.6*    COAG Lab Results  Component Value Date   INR 1.4 (H) 08/29/2023   INR 1.4 (H) 08/28/2023   INR 1.3 (H) 08/28/2023   No results found for: "PTT"

## 2023-08-31 NOTE — Progress Notes (Signed)
Pt declined ambulation in the hallway but was agreeable to moving to chair, pt needed moderate assist to stand. Pt taken off O2 Sats stable at 94%, all needs met prior to leaving. Pt given instructions to call for nurse if she becomes SOB.

## 2023-08-31 NOTE — TOC Initial Note (Signed)
Transition of Care (TOC) - Initial/Assessment Note  Sander Radon, BSN Transitions of Care Unit 4E- RN Case Manager See Treatment Team for direct phone #   Patient Details  Name: [name] MRN: [id number] Date of Birth: Feb 17, 1966  Transition of Care [facility]) CM/SW Contact:    Darrold Span, RN Phone Number: 08/31/2023, 3:02 PM  Clinical Narrative:                 Noted orders for HHPTOT  CM in to speak with pt at bedside- list provided for [facility] choice Per CMS guidelines from [email or web address] website with star ratings (copy placed in shadow chart) Per pt she had HH on her last admit back in May of this year- per chart review pt was set up w/ [name]- pt voiced she would like to use a different agency this time- CM indicated to pt which agencies work well with [employer] plans- pt to review list w/ daughter and Cm to f/u on choice tomorrow.   Pt also asking about personal care aide services- voices that she has applied for Medicaid- CM advised pt to f/u with DSS regarding applications, explained to pt that these services are only covered under Medicaid not Medicare. Re-addressed w/ pt what HH under Medicare coverage is for [facility] therapy needs.   Pt also voiced that she never received RW that was ordered on last admit (referral sent to [employer]) Cm placed call to [employer] liaison to follow up- liaison to have RW delivered to room this afternoon.   TOC to continue to follow for coordination of HH needs.   Per benefits check - Eliquis is zero dollar copay  Expected Discharge Plan: Home w Home Health Services Barriers to Discharge: Continued Medical Work up   Patient Goals and CMS Choice Patient states their goals for this hospitalization and ongoing recovery are:: return home CMS Medicare.gov Compare Post Acute Care list provided to:: Patient Choice offered to / list presented to : Patient      Expected Discharge Plan and Services   Discharge Planning  Services: CM Consult Post Acute Care Choice: Home Health, Durable Medical Equipment Living arrangements for the past 2 months: Single Family Home                   DME Agency: [employer] Date DME Agency Contacted: 08/31/23 Time DME Agency Contacted: 1502 Representative spoke with at DME Agency: Vaughan Basta HH Arranged: PT, OT          Prior Living Arrangements/Services Living arrangements for the past 2 months: Single Family Home Lives with:: Self, Adult Children Patient language and need for interpreter reviewed:: Yes Do you feel safe going back to the place where you live?: Yes        Care giver support system in place?: Yes (comment) Current home services: DME Criminal Activity/Legal Involvement Pertinent to Current Situation/Hospitalization: No - Comment as needed  Activities of Daily Living   ADL Screening (condition at time of admission) Independently performs ADLs?: No Does the patient have a NEW difficulty with bathing/dressing/toileting/self-feeding that is expected to last >3 days?: Yes (Initiates electronic notice to provider for possible OT consult) Does the patient have a NEW difficulty with getting in/out of bed, walking, or climbing stairs that is expected to last >3 days?: Yes (Initiates electronic notice to provider for possible PT consult) Does the patient have a NEW difficulty with communication that is expected to last >3 days?: Yes (Initiates electronic notice to provider  for possible SLP consult) Is the patient deaf or have difficulty hearing?: No Does the patient have difficulty seeing, even when wearing glasses/contacts?: No Does the patient have difficulty concentrating, remembering, or making decisions?: No  Permission Sought/Granted   Permission granted to share information with : Yes, Verbal Permission Granted     Permission granted to share info w AGENCY: HH        Emotional Assessment Appearance:: Appears stated  age Attitude/Demeanor/Rapport: Engaged Affect (typically observed): Accepting, Pleasant Orientation: : Oriented to Self, Oriented to Place, Oriented to  Time, Oriented to Situation Alcohol / Substance Use: Not Applicable Psych Involvement: No (comment)  Admission diagnosis:  H/O aortic aneurysm repair [Z61.096, Z86.79] Patient Active Problem List   Diagnosis Date Noted   H/O aortic aneurysm repair 08/25/2023   Acute pain of right shoulder due to trauma 06/08/2023   Thoracic back pain 03/07/2023   At risk for sleep apnea 03/07/2023   Hypothyroidism 02/13/2023   Malnutrition of moderate degree (HCC) 02/08/2023   Aortic dissection (HCC) 01/29/2023   Bilateral low back pain without sciatica 08/29/2022   Chronic pain of both knees 07/29/2022   Chronic illness 10/27/2021   Paresthesias 06/30/2021   Chronic pain of right hand 06/30/2021   Chronic low back pain 06/30/2021   Voice hoarseness 12/15/2020   Recurrent falls 11/05/2020   Impingement syndrome of left shoulder region 05/27/2020   Inclusion cyst 05/01/2020   Postoperative breast asymmetry 02/28/2020   Cognitive dysfunction 02/21/2020   Chronic gout 01/27/2020   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   Dysphagia 07/23/2019   CKD (chronic kidney disease) stage V requiring chronic dialysis (HCC) 07/12/2019   Normocytic anemia 07/12/2019   Preventative health care 09/10/2018   Port-A-Cath in place 07/12/2018   Family history of breast cancer    Family history of prostate cancer    Family history of lung cancer    Malignant neoplasm of lower-inner quadrant of right breast of female, estrogen receptor positive (HCC) 05/10/2018   Hyperlipidemia 04/26/2017   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 [place] [address] Phone: [phone] Fax: [phone]  [id number] - [place], [place] - [address] [place] [address] Phone: [phone] Fax: [phone]  [facility] [address] [place] [address] Phone: [phone] Fax: [phone]     Social Determinants of Health (SDOH) Social History: SDOH Screenings   Food Insecurity: No Food Insecurity (08/29/2023)  Housing: Low Risk  (08/29/2023)  Transportation Needs: No Transportation Needs (08/29/2023)  Utilities: Not At Risk (08/29/2023)  Depression (PHQ2-9): Low Risk  (06/08/2023)  Financial Resource Strain: Medium Risk (05/07/2022)   Received from [facility], [facility]  Tobacco Use: Medium Risk (08/25/2023)   SDOH Interventions:     Readmission Risk Interventions    02/21/2023    4:05 PM  Readmission Risk Prevention Plan  Transportation Screening Complete  Medication Review Oceanographer) Complete  PCP or Specialist appointment within 3-5 days of discharge Complete  HRI or Home Care Consult Complete  SW Recovery Care/Counseling Consult Complete  Palliative Care Screening Not Applicable  Skilled Nursing Facility Not Applicable

## 2023-09-01 LAB — RENAL FUNCTION PANEL
Albumin: 2.5 g/dL — ABNORMAL LOW (ref 3.5–5.0)
Anion gap: 13 (ref 5–15)
BUN: 45 mg/dL — ABNORMAL HIGH (ref 6–20)
CO2: 25 mmol/L (ref 22–32)
Calcium: 9.1 mg/dL (ref 8.9–10.3)
Chloride: 95 mmol/L — ABNORMAL LOW (ref 98–111)
Creatinine, Ser: 6.67 mg/dL — ABNORMAL HIGH (ref 0.44–1.00)
GFR, Estimated: 7 mL/min — ABNORMAL LOW (ref >60)
Glucose, Bld: 156 mg/dL — ABNORMAL HIGH (ref 70–99)
Phosphorus: 4.8 mg/dL — ABNORMAL HIGH (ref 2.5–4.6)
Potassium: 4.1 mmol/L (ref 3.5–5.1)
Sodium: 133 mmol/L — ABNORMAL LOW (ref 135–145)

## 2023-09-01 LAB — GLUCOSE, CAPILLARY
Glucose-Capillary: 80 mg/dL (ref 70–99)
Glucose-Capillary: 75 mg/dL (ref 70–99)
Glucose-Capillary: 112 mg/dL — ABNORMAL HIGH (ref 70–99)
Glucose-Capillary: 130 mg/dL — ABNORMAL HIGH (ref 70–99)

## 2023-09-01 LAB — CBC
HCT: 29.2 % — ABNORMAL LOW (ref 36.0–46.0)
Hemoglobin: 9.2 g/dL — ABNORMAL LOW (ref 12.0–15.0)
MCH: 29.2 pg (ref 26.0–34.0)
MCHC: 31.5 g/dL (ref 30.0–36.0)
MCV: 92.7 fL (ref 80.0–100.0)
Platelets: 211 10*3/uL (ref 150–400)
RBC: 3.15 MIL/uL — ABNORMAL LOW (ref 3.87–5.11)
RDW: 16.3 % — ABNORMAL HIGH (ref 11.5–15.5)
WBC: 10.7 10*3/uL — ABNORMAL HIGH (ref 4.0–10.5)
nRBC: 0 % (ref 0.0–0.2)

## 2023-09-01 MED ORDER — HEPARIN SODIUM (PORCINE) 1000 UNIT/ML IJ SOLN
3000.0000 [IU] | Freq: Once | INTRAMUSCULAR | Status: AC
  Administered 2023-09-01: 3000 [IU] via INTRAVENOUS
  Filled 2023-09-01: qty 3

## 2023-09-01 MED ORDER — PENTAFLUOROPROP-TETRAFLUOROETH EX AERO: 1.0000 | INHALATION_SPRAY | CUTANEOUS | Status: DC | PRN

## 2023-09-01 MED ORDER — HEPARIN SODIUM (PORCINE) 1000 UNIT/ML DIALYSIS
3000.0000 [IU] | Freq: Once | INTRAMUSCULAR | Status: DC
  Filled 2023-09-01: qty 3

## 2023-09-01 MED ORDER — ALTEPLASE 2 MG IJ SOLR: 2.0000 mg | Freq: Once | INTRAMUSCULAR | Status: DC | PRN

## 2023-09-01 MED ORDER — LIDOCAINE-PRILOCAINE 2.5-2.5 % EX CREA: 1.0000 | TOPICAL_CREAM | CUTANEOUS | Status: DC | PRN

## 2023-09-01 MED ORDER — LIDOCAINE HCL (PF) 1 % IJ SOLN
5.0000 mL | INTRAMUSCULAR | Status: DC | PRN
Start: 2023-09-01 — End: 2023-09-01

## 2023-09-01 MED ORDER — AMLODIPINE BESYLATE 10 MG PO TABS
10.0000 mg | ORAL_TABLET | Freq: Every day | ORAL | Status: DC
  Administered 2023-09-01 – 2023-09-03 (×3): 10 mg via ORAL
  Filled 2023-09-01 (×3): qty 1

## 2023-09-01 MED ORDER — HEPARIN SODIUM (PORCINE) 1000 UNIT/ML DIALYSIS: 2000.0000 [IU] | INTRAMUSCULAR | Status: DC | PRN

## 2023-09-01 MED ORDER — ~~LOC~~ CARDIAC SURGERY, PATIENT & FAMILY EDUCATION: Freq: Once | Status: AC

## 2023-09-01 MED ORDER — NEPRO/CARBSTEADY PO LIQD
237.0000 mL | ORAL | Status: DC | PRN
Start: 2023-09-01 — End: 2023-09-01

## 2023-09-01 MED ORDER — METOPROLOL TARTRATE 12.5 MG HALF TABLET: 12.5000 mg | ORAL_TABLET | Freq: Two times a day (BID) | ORAL | Status: DC

## 2023-09-01 MED ORDER — HEPARIN SODIUM (PORCINE) 1000 UNIT/ML DIALYSIS: 1000.0000 [IU] | INTRAMUSCULAR | Status: DC | PRN

## 2023-09-01 MED ORDER — SODIUM CHLORIDE 0.9 % IV SOLN: 250.0000 mL | INTRAVENOUS | Status: AC | PRN

## 2023-09-01 MED ORDER — ANTICOAGULANT SODIUM CITRATE 4% (200MG/5ML) IV SOLN: 5.0000 mL | Status: DC | PRN

## 2023-09-01 MED ORDER — ASPIRIN 81 MG PO TBEC
81.0000 mg | DELAYED_RELEASE_TABLET | Freq: Every day | ORAL | Status: DC
  Administered 2023-09-02 – 2023-09-03 (×2): 81 mg via ORAL
  Filled 2023-09-01 (×3): qty 1

## 2023-09-01 NOTE — Progress Notes (Signed)
Pt eating, declined ambulation.   Pt received OHS book and education on restrictions, heart healthy diet, ex guidelines, Move in the Tube sheet, incentive spirometer use when d/c. Pt denies questions and was encouraged to look in the book for additional information.  Pt does not qualify for CRP2.   [name] 09/01/2023 2:15 PM

## 2023-09-01 NOTE — Progress Notes (Signed)
   09/01/23 1215  Vitals  Temp 98.2 F (36.8 C)  BP 136/72  Pulse Rate 82  Resp (!) 22  Oxygen Therapy  SpO2 100 %  Patient Activity (if Appropriate) In bed  Post Treatment  Dialyzer Clearance Clear  Hemodialysis Intake (mL) 0 mL  Liters Processed 78  Fluid Removed (mL) 1500 mL  Tolerated HD Treatment Yes  Post-Hemodialysis Comments tx completed tolerated well goal met  Hemodialysis Catheter Right Internal jugular Double lumen Permanent (Tunneled)  Placement Date/Time: 01/30/23 1606   Serial / Lot #: [id number]  Expiration Date: 01/09/27  Time Out: Correct patient;Correct site;Correct procedure  Maximum sterile barrier precautions: Hand hygiene;Cap;Mask;Sterile gown;Sterile gloves;Large sterile ...  Site Condition No complications  Blue Lumen Status Heparin locked  Red Lumen Status Heparin locked  Purple Lumen Status N/A  Catheter fill solution Heparin 1000 units/ml  Catheter fill volume (Arterial) 1.9 cc  Catheter fill volume (Venous) 1.9  Dressing Type Transparent  Dressing Status Antimicrobial disc in place  Drainage Description None  Dressing Change Due 09/03/23  Post treatment catheter status Capped and Clamped   Received patient in bed to unit.  Alert and oriented.  Informed consent signed and in chart.   TX duration:  Patient tolerated well.  Transported back to the room  Alert, without acute distress.  Hand-off given to patient's nurse.   Access used: hd catheter Access issues: none  Total UF removed: 1500 Medication(s) given: tramadol 50mg , hectoral Post HD VS: see above Post HD weight: 78.5kg   [facility]

## 2023-09-01 NOTE — Progress Notes (Signed)
[facility] Progress Note  Subjective: seen in HD, on dialysis. Goal 1.5 L . BP's okay  Vitals:   09/01/23 1108 09/01/23 1138 09/01/23 1208 09/01/23 1235  BP: (!) 144/96 132/69 137/70 137/79  Pulse: 85 83 88 87  Resp: 19 19 15 16   Temp:    97.9 F (36.6 C)  TempSrc:    Oral  SpO2:    95%  Weight:      Height:        Exam: Gen: Awake/alert and responsive, on RA CVS: RRR, no RG Resp: Anteriorly clear to auscultation Abd: Soft, obese, nontender, bowel sounds normal Ext: No lower extremity edema  HD access: Right IJ TDC intact   Renal-related home meds - norvasc 10 - coreg 25 bid - auryxia 1 ac tid - losartan 50 every day    OP HD: MWF SW  4h  400/600   80.1kg  2/2 baht  RIJ TDC  Heparin 5000 - hectorol 2 mcg    CXR 11/12- clear CXR    Assessment/ Plan: Status post thoracic aortic endovascular graft placement: 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 Off pressors and CT's removed yesterday.  ESKD: usual HD MWF. Had HD here early am on Sunday, then again on 11/12 and 11/14. On HD this am to get back on schedule.  Volume: last CXR clear on 11/12. Tolerated 3 L UF on 11/13. Lower goal 1-2 L. Is slightly below dry wt today and euvolemic on exam , on room air.  Anemia eskd: Hb stable now in 9- 11 range. Sp prbc's.  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 Cont IV vdra for PTH control tiw. HIV infection: Antiretroviral therapy resumed. BP: getting metoprolol 12.5 bid here, BP's are good.      Felicia Moselle MD  [facility] 09/01/2023, 2:35 PM  Recent Labs  Lab 08/29/23 0946 08/30/23 0242 08/31/23 0511 09/01/23 0447 09/01/23 0901  HGB  --    < > 10.4* 9.2*  --   ALBUMIN 2.4*  --   --   --  2.5*  CALCIUM 8.6*  --   --   --  9.1  PHOS 8.0*  --   --   --  4.8*  CREATININE 7.34*  --   --   --  6.67*  K 4.1  --   --   --  4.1   < > = values in this interval not displayed.   No results for input(s): "IRON", "TIBC",  "FERRITIN" in the last 168 hours. Inpatient medications:  allopurinol  100 mg Oral Daily   amLODipine  10 mg Oral Daily   apixaban  5 mg Oral BID   aspirin EC  81 mg Oral Daily   budesonide  0.25 mg Nebulization BID   carvedilol  6.25 mg Oral BID WC   Chlorhexidine Gluconate Cloth  6 each Topical Q0600   Chlorhexidine Gluconate Cloth  6 each Topical Q0600   Chlorhexidine Gluconate Cloth  6 each Topical Q0600   [facility], Patient & Family Education   Does not apply Once   docusate sodium  200 mg Oral Daily   dolutegravir  50 mg Oral Daily   doxercalciferol  2 mcg Intravenous Q T,Th,Sa-HD   emtricitabine-tenofovir AF  1 tablet Oral Daily   ferric citrate  210 mg Oral TID WC   insulin aspart  0-24 Units Subcutaneous TID WC   levothyroxine  175 mcg Oral Daily  losartan  50 mg Oral Daily   magic mouthwash  5 mL Oral TID   metoprolol tartrate  12.5 mg Oral BID   minocycline  100 mg Oral Daily   pantoprazole  40 mg Oral Daily   rosuvastatin  10 mg Oral Daily    sodium chloride      sodium chloride, ondansetron (ZOFRAN) IV, mouth rinse, traMADol

## 2023-09-01 NOTE — Progress Notes (Signed)
Physical Therapy Treatment Patient Details Name: [name] MRN: [id number] DOB: [birth year] Today's Date: 09/01/2023   History of Present Illness Pt is a 57 yo female admitted 11/8  for repeat TEVAR for Type B aortic dissection. 11/12 DVT in left internal jugular, left brachial, axillary and subclavian veins. PMHx: Asthma, CKD, DM, GERD, Graves disease, HIV, HLD, HTN, Thyroid Cancer s/p thyroidectomy, ESRD on HD MWF, TEVAR 4/24, sarcoidosis    PT Comments  Pt admitted with above diagnosis. Pt was able to ambulate with RW with min assist and chair follow of 2nd person for safety as pt gets nauseated and fatigued as she incr distance.  Pt needed steadying assist at times as well.  Needs reinforcment of sternal precautions.   Pt currently with functional limitations due to the deficits listed below (see PT Problem List). Pt will benefit from acute skilled PT to increase their independence and safety with mobility to allow discharge.       If plan is discharge home, recommend the following: A little help with walking and/or transfers;A little help with bathing/dressing/bathroom;Help with stairs or ramp for entrance   Can travel by private vehicle        Equipment Recommendations  Rolling walker (2 wheels);BSC/3in1    Recommendations for Other Services       Precautions / Restrictions Precautions Precautions: Fall;Sternal Precaution Booklet Issued: Yes (comment) Restrictions Weight Bearing Restrictions: Yes RUE Weight Bearing: Non weight bearing LUE Weight Bearing: Non weight bearing Other Position/Activity Restrictions: sternal precautions     Mobility  Bed Mobility Overal bed mobility: Needs Assistance Bed Mobility: Rolling, Sidelying to Sit, Sit to Sidelying Rolling: Contact guard assist Sidelying to sit: Min assist       General bed mobility comments: educated regarding use of compensatory strategies and maintenance of precautions.    Transfers Overall transfer  level: Needs assistance Equipment used: Rolling walker (2 wheels) Transfers: Sit to/from Stand Sit to Stand: Min assist, +2 safety/equipment           General transfer comment: minA to stand up from bed and chair.  Pt taught to push up on knees with hands for sternal precautions and needed cues each time she stood. minA to power up using rocking momentum    Ambulation/Gait Ambulation/Gait assistance: Min assist, +2 safety/equipment Gait Distance (Feet): 100 Feet (100 feet x 2) Assistive device: Rolling walker (2 wheels) Gait Pattern/deviations: Step-through pattern, Decreased stride length, Knees buckling, Narrow base of support Gait velocity: dec Gait velocity interpretation: <1.31 ft/sec, indicative of household ambulator   General Gait Details: Pt with truncal weakness at times and needed steadying assist at times.  Pt with increased distance becomes diaphoretic and nauseated needing to sit at 100'. BP stable. SPO2 > 88% on RA.   Min assist  for walker management   Stairs             Wheelchair Mobility     Tilt Bed    Modified Rankin (Stroke Patients Only)       Balance Overall balance assessment: Needs assistance Sitting-balance support: Feet supported, No upper extremity supported Sitting balance-Leahy Scale: Fair     Standing balance support: During functional activity, Bilateral upper extremity supported Standing balance-Leahy Scale: Poor Standing balance comment: reliant on UE support                            Cognition Arousal: Alert (but sleepy) Behavior During Therapy: [facility] for tasks assessed/performed  Overall Cognitive Status: Impaired/Different from baseline Area of Impairment: Safety/judgement, Awareness, Problem solving, Attention                   Current Attention Level: Sustained, Selective (decr overall secondary to intermittent lethargy)     Safety/Judgement: Decreased awareness of deficits Awareness:  Emergent Problem Solving: Slow processing General Comments: pt reports "My thinking is off, it's slow." Pt sleepy but able to follow commands and repeat previously taught information        Exercises General Exercises - Lower Extremity Long Arc Quad: AROM, Both, 10 reps, Seated Hip Flexion/Marching: AROM, Both, 10 reps, Seated    General Comments        Pertinent Vitals/Pain Pain Assessment Pain Assessment: Faces Faces Pain Scale: Hurts little more Pain Location: chest Pain Descriptors / Indicators: Sore, Operative site guarding Pain Intervention(s): Limited activity within patient's tolerance, Monitored during session, Repositioned    Home Living     Available Help at Discharge: Family;Available PRN/intermittently Type of Home: House                  Prior Function            PT Goals (current goals can now be found in the care plan section) Acute Rehab PT Goals Patient Stated Goal: home Progress towards PT goals: Progressing toward goals    Frequency    Min 1X/week      PT Plan      Co-evaluation              AM-PAC PT "6 Clicks" Mobility   Outcome Measure  Help needed turning from your back to your side while in a flat bed without using bedrails?: A Little Help needed moving from lying on your back to sitting on the side of a flat bed without using bedrails?: A Little Help needed moving to and from a bed to a chair (including a wheelchair)?: A Little Help needed standing up from a chair using your arms (e.g., wheelchair or bedside chair)?: A Little Help needed to walk in hospital room?: A Lot Help needed climbing 3-5 steps with a railing? : A Lot 6 Click Score: 16    End of Session Equipment Utilized During Treatment: Gait belt Activity Tolerance: Patient tolerated treatment well Patient left: in chair;with call bell/phone within reach;with chair alarm set Nurse Communication: Mobility status PT Visit Diagnosis: Unsteadiness on feet  (R26.81)     Time: 6578-4696 PT Time Calculation (min) (ACUTE ONLY): 29 min  Charges:    $Gait Training: 23-37 mins PT General Charges $$ ACUTE PT VISIT: 1 Visit                     Promyse Ardito M,PT Acute Rehab Services [phone]    Bevelyn Buckles 09/01/2023, 3:48 PM

## 2023-09-01 NOTE — Progress Notes (Addendum)
[facility]  Subjective  - Not feeling that well over all.  Slow progress.    Objective (!) 142/63 73 98.5 F (36.9 C) (Oral) 20 95%  Intake/Output Summary (Last 24 hours) at 09/01/2023 0820 Last data filed at 08/31/2023 1141 Gross per 24 hour  Intake --  Output 3000 ml  Net -3000 ml    Palpable radial pulses b Left UE compartments soft, motor intact Chest incision healing well Lungs non labored breathing Palpable DP pulse B LE No change in exam  Assessment/Planning: 57 y.o. female is 7 days post op, s/p: aortic arch debranching with TEVAR   Left are with minimal edema, compartments soft, palpable radial pulses B equal. On Apixaban for anticoagulation   Mosetta Pigeon 09/01/2023 8:20 AM --  VASCULAR STAFF ADDENDUM: I have independently interviewed and examined the patient. I agree with the above.  Progressing appropriately with her comorbidities.  Eating well. OOB. HD MWF Left arm soft AC    Victorino Sparrow MD [facility] Phone Number: [phone] 09/01/2023 1:48 PM    Laboratory Lab Results: Recent Labs    08/31/23 0511 09/01/23 0447  WBC 8.9 10.7*  HGB 10.4* 9.2*  HCT 32.6* 29.2*  PLT 196 211   BMET Recent Labs    08/29/23 0946  NA 133*  K 4.1  CL 95*  CO2 23  GLUCOSE 118*  BUN 70*  CREATININE 7.34*  CALCIUM 8.6*    COAG Lab Results  Component Value Date   INR 1.4 (H) 08/29/2023   INR 1.4 (H) 08/28/2023   INR 1.3 (H) 08/28/2023   No results found for: "PTT"

## 2023-09-01 NOTE — Progress Notes (Signed)
7 Days Post-Op Procedure(s) (LRB): AORTIC ARCH DEBRANCHING USING 12X8X8MM HEMASHIELD GRAFT (N/A) TRANSESOPHAGEAL ECHOCARDIOGRAM (N/A) THORACIC AORTIC ENDOVASCULAR STENT GRAFT (N/A) ULTRASOUND GUIDANCE FOR VASCULAR ACCESS, RIGHT FEMORAL ARTERY (Right) Subjective:  Main complaint if of back pain. Sitting up in bed on cell phone with her sister. Had HD yesterday and planning HD again today to get back on MWF schedule. Eating well.  Objective: Vital signs in last 24 hours: Temp:  [98 F (36.7 C)-98.5 F (36.9 C)] 98.5 F (36.9 C) (11/15 0332) Pulse Rate:  [72-99] 73 (11/15 0332) Cardiac Rhythm: Heart block (11/14 1932) Resp:  [8-23] 20 (11/15 0332) BP: (107-174)/(63-114) 142/63 (11/15 0332) SpO2:  [78 %-100 %] 95 % (11/15 0332) Weight:  [79 kg-87.9 kg] 79.1 kg (11/15 0332)  Hemodynamic parameters for last 24 hours:    Intake/Output from previous day: 11/14 0701 - 11/15 0700 In: -  Out: 3000  Intake/Output this shift: No intake/output data recorded.  General appearance: alert and cooperative Neurologic: intact Heart: regular rate and rhythm Lungs: clear to auscultation bilaterally Extremities: left arm mild swelling Wound: chest incision healing well  Lab Results: Recent Labs    08/31/23 0511 09/01/23 0447  WBC 8.9 10.7*  HGB 10.4* 9.2*  HCT 32.6* 29.2*  PLT 196 211   BMET:  Recent Labs    08/29/23 0946  NA 133*  K 4.1  CL 95*  CO2 23  GLUCOSE 118*  BUN 70*  CREATININE 7.34*  CALCIUM 8.6*    PT/INR: No results for input(s): "LABPROT", "INR" in the last 72 hours. ABG    Component Value Date/Time   PHART 7.376 08/26/2023 1412   HCO3 20.9 08/26/2023 1412   TCO2 22 08/26/2023 1412   ACIDBASEDEF 4.0 (H) 08/26/2023 1412   O2SAT 92 08/26/2023 1412   CBG (last 3)  Recent Labs    08/31/23 1604 08/31/23 2154 09/01/23 0621  GLUCAP 119* 182* 80    Assessment/Plan: S/P Procedure(s) (LRB): AORTIC ARCH DEBRANCHING USING 12X8X8MM HEMASHIELD GRAFT  (N/A) TRANSESOPHAGEAL ECHOCARDIOGRAM (N/A) THORACIC AORTIC ENDOVASCULAR STENT GRAFT (N/A) ULTRASOUND GUIDANCE FOR VASCULAR ACCESS, RIGHT FEMORAL ARTERY (Right)  Hemodynamically stable in sinus rhythm. Continue current antihypertensives.  Planning HD today.  She needs to ambulate   Back pain may be due to sternotomy, stent graft, laying in bed a lot.   Continue NOAC for left UE DVT.  Plan home tomorrow or Sunday with her daughter and resume outpt HD Monday.   LOS: 7 days    [name] 09/01/2023

## 2023-09-01 NOTE — Evaluation (Signed)
Speech Language Pathology Evaluation Patient Details Name: [name] MRN: [id number] DOB: 08-12-1966 Today's Date: 09/01/2023 Time: 4742-5956 SLP Time Calculation (min) (ACUTE ONLY): 8 min  Problem List:  Patient Active Problem List   Diagnosis Date Noted   H/O aortic aneurysm repair 08/25/2023   Acute pain of right shoulder due to trauma 06/08/2023   Thoracic back pain 03/07/2023   At risk for sleep apnea 03/07/2023   Hypothyroidism 02/13/2023   Malnutrition of moderate degree (HCC) 02/08/2023   Aortic dissection (HCC) 01/29/2023   Bilateral low back pain without sciatica 08/29/2022   Chronic pain of both knees 07/29/2022   Chronic illness 10/27/2021   Paresthesias 06/30/2021   Chronic pain of right hand 06/30/2021   Chronic low back pain 06/30/2021   Voice hoarseness 12/15/2020   Recurrent falls 11/05/2020   Impingement syndrome of left shoulder region 05/27/2020   Inclusion cyst 05/01/2020   Postoperative breast asymmetry 02/28/2020   Cognitive dysfunction 02/21/2020   Chronic gout 01/27/2020   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   Dysphagia 07/23/2019   CKD (chronic kidney disease) stage V requiring chronic dialysis (HCC) 07/12/2019   Normocytic anemia 07/12/2019   Preventative health care 09/10/2018   Port-A-Cath in place 07/12/2018   Family history of breast cancer    Family history of prostate cancer    Family history of lung cancer    Malignant neoplasm of lower-inner quadrant of right breast of female, estrogen receptor positive (HCC) 05/10/2018   Hyperlipidemia 04/26/2017   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   GAD (generalized anxiety disorder) 04/11/2017   Laryngopharyngeal reflux (LPR) 11/24/2016   Obesity (BMI 30-39.9) 10/14/2016   Allergic rhinitis 02/18/2016   Type 2 diabetes mellitus with hyperlipidemia (HCC) 06/23/2015   Recurrent boils 06/11/2014   Hidradenitis suppurativa 01/30/2012   Sarcoidosis  02/08/2007   Cigarette smoker 02/08/2007   HIV (human immunodeficiency virus infection) (HCC) 07/24/2006   Depression 07/24/2006   Hypertension 07/24/2006   Asthma 07/24/2006   Past Medical History:  Past Medical History:  Diagnosis Date   Acute pancreatitis 10/23/2021   Acute renal failure superimposed on stage 4 chronic kidney disease (HCC) 10/24/2021   Allergy    Anemia    Normocytic   Antibiotic-induced yeast infection 09/28/2020   Anxiety    Asthma    Bronchitis 2005   Bursitis of left shoulder 09/06/2019   CKD (chronic kidney disease)    CLASS 1-EXOPHTHALMOS-THYROTOXIC 02/08/2007   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  Genes Analyzed (43 total): APC*, ATM*, AXIN2, BARD1, BMPR1A, BRCA1*, BRCA2*, BRIP1*, CDH1*, CDK4, CDKN2A, CHEK2*, DICER1, GALNT12, HOXB13, MEN1, MLH1*, MRE11A, MSH2*, MSH3, MSH6*, MUTYH*, NBN, NF1*, NTHL1, PALB2*, PMS2*, POLD1, POLE, PTEN*, RAD50, RAD51C*, RAD51D*, RET, SDHB, SDHD, SMAD4, SMARCA4, STK11 and TP53* (sequencing and deletion/duplication); EGFR (s   GERD 07/24/2006   GRAVE'S DISEASE 01/01/2008   History of hidradenitis suppurativa    History of kidney stones    History of thrush    HIV DISEASE 07/24/2006   dx March 05   Hyperlipidemia    HYPERTENSION 07/24/2006   Hyperthyroidism 08/2006   Grave's  Disease -diffuse radiotracer uptake 08/25/06 Thyroid scan-Cold nodule to R lower lobe of thyrorid   Ileus (HCC) 02/14/2023   Menometrorrhagia    hx of   Nephrolithiasis    Panniculitis 05/12/2020   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HCC)    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 Completed 2 yrs Prednisone after lung bx confirmation. No symptoms since then.   Sebaceous cyst 04/21/2020   Suppurative hidradenitis    Thyroid cancer (HCC)    THYROID NODULE, RIGHT 02/08/2007   Past Surgical History:  Past Surgical History:  Procedure Laterality Date   AORTIC ARCH DEBRANCHING N/A 08/25/2023   Procedure: AORTIC ARCH DEBRANCHING USING 12X8X8MM HEMASHIELD GRAFT;  Surgeon: [name], MD;  Location: [facility];  Service: Open Heart Surgery;  Laterality: N/A;  with bypasses to the innominate and left common carotid arteries   APPLICATION OF WOUND VAC N/A 01/20/2021   Procedure: APPLICATION OF WOUND VAC;  Surgeon: Peggye Form, DO;  Location: [facility];  Service: Plastics;  Laterality: N/A;   BREAST EXCISIONAL BIOPSY Right 04/26/2018   right axilla negative   BREAST EXCISIONAL BIOPSY Left 04/26/2018   left axilla negative   BREAST LUMPECTOMY Right 10/03/2018   malignant   BREAST LUMPECTOMY WITH RADIOACTIVE SEED AND SENTINEL LYMPH NODE BIOPSY Right 10/03/2018   Procedure: RIGHT BREAST LUMPECTOMY WITH RADIOACTIVE SEED AND SENTINEL LYMPH NODE MAPPING;  Surgeon: Harriette Bouillon, MD;  Location: [facility];  Service: General;  Laterality: Right;   BREAST SURGERY  1997   Breast Reduction    CYSTOSCOPY W/ URETERAL STENT REMOVAL  11/09/2012   Procedure: CYSTOSCOPY WITH STENT REMOVAL;  Surgeon: Sebastian Ache, MD;  Location: [facility];  Service: Urology;  Laterality: Right;    CYSTOSCOPY WITH RETROGRADE PYELOGRAM, URETEROSCOPY AND STENT PLACEMENT  11/09/2012   Procedure: CYSTOSCOPY WITH RETROGRADE PYELOGRAM, URETEROSCOPY AND STENT PLACEMENT;  Surgeon: Sebastian Ache, MD;  Location: [facility];  Service: Urology;  Laterality: Left;  LEFT URETEROSCOPY, STONE MANIPULATION, left STENT exchange    CYSTOSCOPY WITH STENT PLACEMENT  10/02/2012   Procedure: CYSTOSCOPY WITH STENT PLACEMENT;  Surgeon: Sebastian Ache, MD;  Location: [facility];  Service: Urology;  Laterality: Left;   DEBRIDEMENT AND CLOSURE WOUND N/A 01/20/2021   Procedure: Excision of abdominal wound with closure;  Surgeon: Peggye Form, DO;  Location: [facility];  Service: Plastics;  Laterality: N/A;   DILATION AND CURETTAGE OF UTERUS  11/2002   s/p for 1st trimester nonviable pregnancy   EYE SURGERY     sty under eyelid   INCISE AND DRAIN ABCESS  08/2002   s/p I &D for righ inframmary fold hidradenitis   INCISION AND DRAINAGE PERITONSILLAR ABSCESS  12/2001   IR CV LINE INJECTION  06/07/2018   IR FLUORO GUIDE CV LINE RIGHT  01/30/2023   IR IMAGING GUIDED PORT INSERTION  06/20/2018   IR REMOVAL TUN ACCESS W/ PORT W/O FL MOD SED  06/20/2018   IR US GUIDE VASC ACCESS RIGHT  01/30/2023  IRRIGATION AND DEBRIDEMENT ABSCESS  01/31/2012   Procedure: IRRIGATION AND DEBRIDEMENT ABSCESS;  Surgeon: Kandis Cocking, MD;  Location: [facility];  Service: General;  Laterality: Right;  right breast and axilla    NEPHROLITHOTOMY  10/02/2012   Procedure: NEPHROLITHOTOMY PERCUTANEOUS;  Surgeon: Sebastian Ache, MD;  Location: [facility];  Service: Urology;  Laterality: Right;  First Stage Percutaneous Nephrolithotomy with Surgeon Access, Left Ureteral Stent     NEPHROLITHOTOMY  10/04/2012   Procedure: NEPHROLITHOTOMY PERCUTANEOUS SECOND LOOK;  Surgeon: Sebastian Ache, MD;  Location: [facility];  Service: Urology;  Laterality: Right;      NEPHROLITHOTOMY  10/08/2012   Procedure: NEPHROLITHOTOMY PERCUTANEOUS;  Surgeon:  Sebastian Ache, MD;  Location: [facility];  Service: Urology;  Laterality: Right;  THIRD STAGE, nephrostomy tube exchange x 2   NEPHROLITHOTOMY  10/11/2012   Procedure: NEPHROLITHOTOMY PERCUTANEOUS SECOND LOOK;  Surgeon: Sebastian Ache, MD;  Location: [facility];  Service: Urology;  Laterality: Right;  RIGHT 4 STAGE PERCUTANOUS NEPHROLITHOTOMY, right URETEROSCOPY WITH HOLMIUM LASER    PANNICULECTOMY N/A 12/21/2020   Procedure: PANNICULECTOMY;  Surgeon: Peggye Form, DO;  [facility]: [facility];  Service: Plastics;  Laterality: N/A;   PERCUTANEOUS NEPHROSTOLITHOTOMY  04/2022   PORT-A-CATH REMOVAL N/A 07/16/2020   Procedure: REMOVAL PORT-A-CATH;  Surgeon: Harriette Bouillon, MD;  Location: [facility];  Service: General;  Laterality: N/A;   PORTACATH PLACEMENT Left 05/17/2018   Procedure: INSERTION PORT-A-CATH;  Surgeon: Abigail Miyamoto, MD;  Location: [facility];  Service: General;  Laterality: Left;   RADICAL NECK DISSECTION  04/12/2017   limited/notes 04/12/2017   RADICAL NECK DISSECTION N/A 04/12/2017   Procedure: RADICAL NECK DISSECTION;  Surgeon: Christia Reading, MD;  Location: [facility] OR;  Service: ENT;  Laterality: N/A;  limited neck dissection 2 hours total   REDUCTION MAMMAPLASTY Bilateral 1998   RIGHT/LEFT HEART CATH AND CORONARY ANGIOGRAPHY N/A 03/12/2020   Procedure: RIGHT/LEFT HEART CATH AND CORONARY ANGIOGRAPHY;  Surgeon: Dolores Patty, MD;  [facility];  Service: Cardiovascular;  Laterality: N/A;   [place]  1994   TEE WITHOUT CARDIOVERSION N/A 08/25/2023   Procedure: TRANSESOPHAGEAL ECHOCARDIOGRAM;  Surgeon: [name], MD;  Location: [facility];  Service: Open Heart Surgery;  Laterality: N/A;   THORACIC AORTIC ENDOVASCULAR STENT GRAFT N/A 02/02/2023   Procedure: THORACIC AORTIC ENDOVASCULAR STENT GRAFT;  Surgeon: Victorino Sparrow, MD;  Location: [facility] OR;  Service: Vascular;  Laterality: N/A;   THORACIC AORTIC ENDOVASCULAR STENT GRAFT N/A 08/25/2023    Procedure: THORACIC AORTIC ENDOVASCULAR STENT GRAFT;  Surgeon: Victorino Sparrow, MD;  Location: [facility] OR;  Service: Vascular;  Laterality: N/A;   THYROIDECTOMY  04/12/2017   completion/notes 04/12/2017   THYROIDECTOMY N/A 04/12/2017   Procedure: THYROIDECTOMY;  Surgeon: Christia Reading, MD;  Location: [facility] OR;  Service: ENT;  Laterality: N/A;  Completion Thyroidectomy   TOTAL THYROIDECTOMY  2010   ULTRASOUND GUIDANCE FOR VASCULAR ACCESS Right 08/25/2023   Procedure: ULTRASOUND GUIDANCE FOR VASCULAR ACCESS, RIGHT FEMORAL ARTERY;  Surgeon: Victorino Sparrow, MD;  Location: [facility] OR;  Service: Vascular;  Laterality: Right;   HPI:  BRITTNEYANN DELAPUENTE is a 57 yo female who underwent TEVAR for aortic dissection 02/02/23 with planned aortic arch debranching and stent 11/8. 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  She endorses acute concerns with word-finding and memory. Pt presents with deficits related to sustained attention, memory (retrieval > storage), and problem solving. No expressive difficulties noted, although recognize potential for word-finding difficulty at the more complex conversation level. No further SLP f/u is necessary for cognitive deficits acutely, although recommend f/u with [facility] SLP to address deficits listed above.    SLP Assessment  SLP Recommendation/Assessment: All further Speech Lanaguage Pathology  needs can be addressed in the next venue of care SLP Visit Diagnosis: Cognitive communication deficit (R41.841)    Recommendations for follow up therapy are one component of a multi-disciplinary discharge planning process, led by the attending physician.  Recommendations may be updated based on patient status, additional functional criteria and insurance  authorization.    Follow Up Recommendations  Home health SLP    Assistance Recommended at Discharge  Frequent or constant Supervision/Assistance  Functional Status Assessment Patient has had a recent decline in their functional status and demonstrates the ability to make significant improvements in function in a reasonable and predictable amount of time.  Frequency and Duration           SLP Evaluation Cognition  Overall Cognitive Status: Impaired/Different from baseline Arousal/Alertness: Awake/alert Orientation Level: Oriented X4 Attention: Sustained Sustained Attention: Impaired Sustained Attention Impairment: Verbal basic Memory: Impaired Memory Impairment: Retrieval deficit Awareness: Impaired Awareness Impairment: Emergent impairment Problem Solving: Impaired Problem Solving Impairment: Verbal basic       Comprehension  Auditory Comprehension Overall Auditory Comprehension: Appears within functional limits for tasks assessed    Expression Expression Primary Mode of Expression: Verbal Verbal Expression Overall Verbal Expression: Appears within functional limits for tasks assessed   Oral / Motor  Oral Motor/Sensory Function Overall Oral Motor/Sensory Function: Within functional limits Motor Speech Overall Motor Speech: Appears within functional limits for tasks assessed            Gwynneth Aliment, M.A., CF-SLP Speech Language Pathology, Acute Rehabilitation Services  Secure Chat preferred [phone]  09/01/2023, 2:12 PM

## 2023-09-01 NOTE — Progress Notes (Signed)
Noted that per MD note pt may be stable for d/c over the weekend. Contacted [facility] to provide an update regarding possible d/c this weekend and that pt may resume care on Monday. Will assist as needed.   Olivia Canter Renal Navigator [phone]

## 2023-09-02 LAB — CBC
HCT: 29.9 % — ABNORMAL LOW (ref 36.0–46.0)
Hemoglobin: 9.4 g/dL — ABNORMAL LOW (ref 12.0–15.0)
MCH: 28.9 pg (ref 26.0–34.0)
MCHC: 31.4 g/dL (ref 30.0–36.0)
MCV: 92 fL (ref 80.0–100.0)
Platelets: 245 10*3/uL (ref 150–400)
RBC: 3.25 MIL/uL — ABNORMAL LOW (ref 3.87–5.11)
RDW: 16.2 % — ABNORMAL HIGH (ref 11.5–15.5)
WBC: 10.6 10*3/uL — ABNORMAL HIGH (ref 4.0–10.5)
nRBC: 0.2 % (ref 0.0–0.2)

## 2023-09-02 LAB — GLUCOSE, CAPILLARY
Glucose-Capillary: 79 mg/dL (ref 70–99)
Glucose-Capillary: 99 mg/dL (ref 70–99)
Glucose-Capillary: 129 mg/dL — ABNORMAL HIGH (ref 70–99)
Glucose-Capillary: 126 mg/dL — ABNORMAL HIGH (ref 70–99)

## 2023-09-02 MED ORDER — METHOCARBAMOL 500 MG PO TABS
500.0000 mg | ORAL_TABLET | Freq: Four times a day (QID) | ORAL | Status: DC | PRN
  Administered 2023-09-02 – 2023-09-03 (×3): 500 mg via ORAL
  Filled 2023-09-02 (×3): qty 1

## 2023-09-02 MED ORDER — ONDANSETRON 4 MG PO TBDP
4.0000 mg | ORAL_TABLET | Freq: Four times a day (QID) | ORAL | Status: DC | PRN
  Administered 2023-09-02: 4 mg via ORAL
  Filled 2023-09-02: qty 1

## 2023-09-02 NOTE — Progress Notes (Signed)
Physical Therapy Treatment Patient Details Name: [name] MRN: [id number] DOB: 02/26/1966 Today's Date: 09/02/2023   History of Present Illness Pt is a 57 yo female admitted 11/8  for repeat TEVAR for Type B aortic dissection. 11/12 DVT in left internal jugular, left brachial, axillary and subclavian veins. PMHx: Asthma, CKD, DM, GERD, Graves disease, HIV, HLD, HTN, Thyroid Cancer s/p thyroidectomy, ESRD on HD MWF, TEVAR 4/24, sarcoidosis    PT Comments  Pt received sitting in the recliner and agreeable to session. Pt requires intermittent cues to maintain sternal precautions throughout session. Pt able to stand with min A and ambulate with CGA this session. However, pt limited by dizziness and nausea during ambulation although BP stable. Pt reporting concern over navigating steps into her home, but is unable to tolerate stair trial this session. Pt continues to benefit from PT services to progress toward functional mobility goals.     If plan is discharge home, recommend the following: A little help with walking and/or transfers;A little help with bathing/dressing/bathroom;Help with stairs or ramp for entrance   Can travel by private vehicle        Equipment Recommendations  Rolling walker (2 wheels);BSC/3in1    Recommendations for Other Services       Precautions / Restrictions Precautions Precautions: Fall;Sternal Restrictions Weight Bearing Restrictions: Yes RUE Weight Bearing: Non weight bearing LUE Weight Bearing: Non weight bearing Other Position/Activity Restrictions: sternal precautions     Mobility  Bed Mobility               General bed mobility comments: Pt beginning and ending session in recliner    Transfers Overall transfer level: Needs assistance Equipment used: Rolling walker (2 wheels) Transfers: Sit to/from Stand Sit to Stand: Min assist           General transfer comment: light min A to stand from recliner after unsuccessful  attempt. use of momentum and hugging heart pillow    Ambulation/Gait Ambulation/Gait assistance: Contact guard assist Gait Distance (Feet): 100 Feet Assistive device: Rolling walker (2 wheels) Gait Pattern/deviations: Step-through pattern, Decreased stride length       General Gait Details: steady gait with RW support. Cues for RW proximity      Balance Overall balance assessment: Needs assistance Sitting-balance support: Feet supported, No upper extremity supported Sitting balance-Leahy Scale: Fair Sitting balance - Comments: in recliner   Standing balance support: During functional activity, Bilateral upper extremity supported Standing balance-Leahy Scale: Fair Standing balance comment: with RW support                            Cognition Arousal: Alert Behavior During Therapy: WFL for tasks assessed/performed Overall Cognitive Status: Within Functional Limits for tasks assessed                                          Exercises      General Comments General comments (skin integrity, edema, etc.): Pt reporting dizziness and nausea that progress with increased time standing. BP 133/76 in standing and 128/68 after gait trial.      Pertinent Vitals/Pain Pain Assessment Pain Assessment: Faces Faces Pain Scale: Hurts even more Pain Location: chest; B shoulder blades (R>L) Pain Descriptors / Indicators: Sore, Operative site guarding, Grimacing Pain Intervention(s): Limited activity within patient's tolerance, Monitored during session, Repositioned     PT  Goals (current goals can now be found in the care plan section) Acute Rehab PT Goals Patient Stated Goal: home PT Goal Formulation: With patient Time For Goal Achievement: 09/13/23 Progress towards PT goals: Progressing toward goals    Frequency    Min 1X/week       AM-PAC PT "6 Clicks" Mobility   Outcome Measure  Help needed turning from your back to your side while in a  flat bed without using bedrails?: A Little Help needed moving from lying on your back to sitting on the side of a flat bed without using bedrails?: A Little Help needed moving to and from a bed to a chair (including a wheelchair)?: A Little Help needed standing up from a chair using your arms (e.g., wheelchair or bedside chair)?: A Little Help needed to walk in hospital room?: A Little Help needed climbing 3-5 steps with a railing? : A Lot 6 Click Score: 17    End of Session Equipment Utilized During Treatment: Gait belt Activity Tolerance: Patient tolerated treatment well;Other (comment) (dizziness and nausea) Patient left: in chair;with call bell/phone within reach;with chair alarm set Nurse Communication: Mobility status PT Visit Diagnosis: Unsteadiness on feet (R26.81)     Time: 1610-9604 PT Time Calculation (min) (ACUTE ONLY): 24 min  Charges:    $Gait Training: 23-37 mins PT General Charges $$ ACUTE PT VISIT: 1 Visit                     Johny Shock, PTA Acute Rehabilitation Services Secure Chat Preferred  [phone]    Johny Shock 09/02/2023, 11:24 AM

## 2023-09-02 NOTE — Progress Notes (Signed)
  Progress Note    09/02/2023 1:50 PM 8 Days Post-Op  Subjective: Looks very good this morning discussing plans for Christmas  Vitals:   09/02/23 0812 09/02/23 1123  BP:  116/66  Pulse:  77  Resp:  15  Temp:  98 F (36.7 C)  SpO2: 94% 92%    Physical Exam: Awake alert and oriented Nonlabored respirations Abdomen is soft and nontender Feet are warm and well-perfused  CBC    Component Value Date/Time   WBC 10.6 (H) 09/02/2023 0323   RBC 3.25 (L) 09/02/2023 0323   HGB 9.4 (L) 09/02/2023 0323   HGB 11.7 (L) 07/27/2021 1110   HCT 29.9 (L) 09/02/2023 0323   PLT 245 09/02/2023 0323   PLT 228 07/27/2021 1110   MCV 92.0 09/02/2023 0323   MCH 28.9 09/02/2023 0323   MCHC 31.4 09/02/2023 0323   RDW 16.2 (H) 09/02/2023 0323   LYMPHSABS 1.0 08/28/2023 0203   MONOABS 1.2 (H) 08/28/2023 0203   EOSABS 0.0 08/28/2023 0203   BASOSABS 0.0 08/28/2023 0203    BMET    Component Value Date/Time   NA 133 (L) 09/01/2023 0901   NA 142 12/05/2022 0000   K 4.1 09/01/2023 0901   CL 95 (L) 09/01/2023 0901   CO2 25 09/01/2023 0901   GLUCOSE 156 (H) 09/01/2023 0901   BUN 45 (H) 09/01/2023 0901   BUN 44 (A) 12/05/2022 0000   CREATININE 6.67 (H) 09/01/2023 0901   CREATININE 2.72 (H) 07/27/2021 1110   CREATININE 2.74 (H) 03/17/2021 1050   CALCIUM 9.1 09/01/2023 0901   GFRNONAA 7 (L) 09/01/2023 0901   GFRNONAA 20 (L) 07/27/2021 1110   GFRAA 39 (L) 07/14/2020 1053   GFRAA 33 (L) 08/12/2019 1447    INR    Component Value Date/Time   INR 1.4 (H) 08/29/2023 0615     Intake/Output Summary (Last 24 hours) at 09/02/2023 1350 Last data filed at 09/02/2023 0747 Gross per 24 hour  Intake 220 ml  Output --  Net 220 ml     Assessment/plan:  57 y.o. female is s/p TEVAR with concomitant aortic arch to branching.  Progressing well, likely home soon  [place] C. Randie Heinz, MD [facility] Office: [phone] Pager: [phone]  09/02/2023 1:50 PM

## 2023-09-02 NOTE — Progress Notes (Addendum)
      [address]       [place] [address]             [phone]      8 Days Post-Op Procedure(s) (LRB): AORTIC ARCH DEBRANCHING USING 12X8X8MM HEMASHIELD GRAFT (N/A) TRANSESOPHAGEAL ECHOCARDIOGRAM (N/A) THORACIC AORTIC ENDOVASCULAR STENT GRAFT (N/A) ULTRASOUND GUIDANCE FOR VASCULAR ACCESS, RIGHT FEMORAL ARTERY (Right)  Subjective:  Patient without complaints.  She is concerned about pain management at discharge.  She notes Robaxin really helped her back pain yesterday, but has been discontinued.  She would feel more comfortable going home tomorrow.  Objective: Vital signs in last 24 hours: Temp:  [97.9 F (36.6 C)-98.6 F (37 C)] 98.3 F (36.8 C) (11/16 0740) Pulse Rate:  [75-89] 86 (11/16 0740) Cardiac Rhythm: Heart block (11/15 2030) Resp:  [15-23] 19 (11/16 0740) BP: (118-208)/(56-157) 125/69 (11/16 0740) SpO2:  [90 %-100 %] 94 % (11/16 0812) Weight:  [78 kg-79.1 kg] 78.2 kg (11/16 0609)  Intake/Output from previous day: 11/15 0701 - 11/16 0700 In: 120 [P.O.:120] Out: 1500  Intake/Output this shift: Total I/O In: 100 [P.O.:100] Out: -   General appearance: alert, cooperative, and no distress Heart: regular rate and rhythm Lungs: clear to auscultation bilaterally Abdomen: soft, non-tender; bowel sounds normal; no masses,  no organomegaly Extremities: edema trace, LUE swelling improving Wound: clean and dry  Lab Results: Recent Labs    09/01/23 0447 09/02/23 0323  WBC 10.7* 10.6*  HGB 9.2* 9.4*  HCT 29.2* 29.9*  PLT 211 245   BMET:  Recent Labs    09/01/23 0901  NA 133*  K 4.1  CL 95*  CO2 25  GLUCOSE 156*  BUN 45*  CREATININE 6.67*  CALCIUM 9.1    PT/INR: No results for input(s): "LABPROT", "INR" in the last 72 hours. 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 will adjust pain medication today,, plan for d/c in AM if she remains stable   LOS: 8 days    Felicia Dandy, PA-C 09/02/2023   Chart reviewed, patient examined, agree with above. Back pain is much better with Robaxin. Will continue as outpt as needed. She is ambulating ok with walker. Plan home tomorrow.

## 2023-09-02 NOTE — Progress Notes (Signed)
[facility] Progress Note  Subjective: seen in room. Had HD yesterday w/ 1.5 L removed. No BP drops.   Vitals:   09/01/23 2041 09/01/23 2316 09/02/23 0356 09/02/23 0609  BP: (!) 118/56 125/63 (!) 141/68   Pulse: 81 75 75   Resp: 20 15 16    Temp: 97.9 F (36.6 C) 98.5 F (36.9 C) 98.4 F (36.9 C)   TempSrc: Oral Oral Oral   SpO2: 94% 98% 92%   Weight:    78.2 kg  Height:        Exam: Gen: Awake/alert and responsive, on RA CVS: RRR, no RG Resp: Anteriorly clear to auscultation Abd: Soft, obese, nontender, bowel sounds normal Ext: No lower extremity edema  HD access: Right IJ TDC intact   Renal-related home meds - norvasc 10 - coreg 25 bid - auryxia 1 ac tid - losartan 50 every day    OP HD: MWF SW  4h  400/600   80.1kg  2/2 baht  RIJ TDC  Heparin 5000 - hectorol 2 mcg    CXR 11/12- clear CXR    Assessment/ Plan: Status post thoracic aortic endovascular graft placement: 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 Per TCTS.  ESKD: usual HD MWF. Had HD here Tues/ thurs/ Friday this week. Transition to MWF next week.  Volume: last CXR clear on 11/12. Is 2kg under, has lost some body weight. Euvolemic on exam. Make new dry wt 78.5kg if dc'd today.  Anemia eskd: Hb stable now in 9- 11 range. Sp prbc's.  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 Cont IV vdra for PTH control tiw. HIV infection: Antiretroviral therapy resumed. HTN: back taking her 3 home HTN medications. BP's controlled.     Vinson Moselle MD  [facility] 09/02/2023, 7:09 AM  Recent Labs  Lab 08/29/23 0946 08/30/23 0242 09/01/23 0447 09/01/23 0901 09/02/23 0323  HGB  --    < > 9.2*  --  9.4*  ALBUMIN 2.4*  --   --  2.5*  --   CALCIUM 8.6*  --   --  9.1  --   PHOS 8.0*  --   --  4.8*  --   CREATININE 7.34*  --   --  6.67*  --   K 4.1  --   --  4.1  --    < > = values in this interval not displayed.   No results for input(s): "IRON", "TIBC", "FERRITIN"  in the last 168 hours. Inpatient medications:  allopurinol  100 mg Oral Daily   amLODipine  10 mg Oral Daily   apixaban  5 mg Oral BID   aspirin EC  81 mg Oral Daily   budesonide  0.25 mg Nebulization BID   carvedilol  6.25 mg Oral BID WC   Chlorhexidine Gluconate Cloth  6 each Topical Q0600   Chlorhexidine Gluconate Cloth  6 each Topical Q0600   docusate sodium  200 mg Oral Daily   dolutegravir  50 mg Oral Daily   doxercalciferol  2 mcg Intravenous Q T,Th,Sa-HD   emtricitabine-tenofovir AF  1 tablet Oral Daily   ferric citrate  210 mg Oral TID WC   insulin aspart  0-24 Units Subcutaneous TID WC   levothyroxine  175 mcg Oral Daily   losartan  50 mg Oral Daily   magic mouthwash  5 mL Oral TID   minocycline  100 mg Oral Daily   pantoprazole  40 mg Oral Daily  rosuvastatin  10 mg Oral Daily    sodium chloride      sodium chloride, ondansetron (ZOFRAN) IV, mouth rinse, traMADol

## 2023-09-03 DIAGNOSIS — I82629 Acute embolism and thrombosis of deep veins of unspecified upper extremity: Secondary | ICD-10-CM

## 2023-09-03 HISTORY — DX: Acute embolism and thrombosis of deep veins of unspecified upper extremity: I82.629

## 2023-09-03 LAB — CBC
HCT: 31.2 % — ABNORMAL LOW (ref 36.0–46.0)
Hemoglobin: 10.1 g/dL — ABNORMAL LOW (ref 12.0–15.0)
MCH: 29.9 pg (ref 26.0–34.0)
MCHC: 32.4 g/dL (ref 30.0–36.0)
MCV: 92.3 fL (ref 80.0–100.0)
Platelets: 277 10*3/uL (ref 150–400)
RBC: 3.38 MIL/uL — ABNORMAL LOW (ref 3.87–5.11)
RDW: 16.5 % — ABNORMAL HIGH (ref 11.5–15.5)
WBC: 11.1 10*3/uL — ABNORMAL HIGH (ref 4.0–10.5)
nRBC: 0 % (ref 0.0–0.2)

## 2023-09-03 LAB — GLUCOSE, CAPILLARY
Glucose-Capillary: 85 mg/dL (ref 70–99)
Glucose-Capillary: 161 mg/dL — ABNORMAL HIGH (ref 70–99)

## 2023-09-03 MED ORDER — CARVEDILOL 6.25 MG PO TABS: 6.2500 mg | ORAL_TABLET | Freq: Two times a day (BID) | ORAL | 3 refills | Status: DC

## 2023-09-03 MED ORDER — APIXABAN 5 MG PO TABS: 5.0000 mg | ORAL_TABLET | Freq: Two times a day (BID) | ORAL | 3 refills | Status: DC

## 2023-09-03 MED ORDER — TRAMADOL HCL 50 MG PO TABS: 50.0000 mg | ORAL_TABLET | Freq: Four times a day (QID) | ORAL | 0 refills | Status: DC | PRN

## 2023-09-03 MED ORDER — METHOCARBAMOL 500 MG PO TABS: 500.0000 mg | ORAL_TABLET | Freq: Four times a day (QID) | ORAL | 0 refills | Status: DC | PRN

## 2023-09-03 NOTE — Progress Notes (Signed)
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 Per TCTS.    LUE DVT-continue Eliquis  HIV infection: Antiretroviral therapy resumed.  Aranesp: Given: no   Date and amount of last dose: 0  Last Hgb: 10.1 PRBC's Given:? Date/# of units: not seen  ESA dose for discharge: mircera per protocal  mcg IV q 2 weeks  IV Iron dose at discharge: no  Heparin change: no  EDW Change: yes New EDW: 79kg  Bath Change: no  Access intervention/Change: no Details:  Hectorol/Calcitriol change: no  Discharge Labs: Calcium9.1 Phosphorus 4.8 Albumin 2.5 K+ 4.1  IV Antibiotics: no Details:  On Coumadin?: no Last INR: Next INR: Managed By:   OTHER/APPTS/LAB ORDERS:    D/C Meds to be reconciled by nurse after every discharge.  Completed By:   Reviewed by: MD:______ RN_______

## 2023-09-03 NOTE — Progress Notes (Signed)
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 Per TCTS.    LUE DVT-continue Eliquis  HIV infection: Antiretroviral therapy resumed.  Aranesp: Given: no   Date and amount of last dose: 0  Last Hgb: 10.1 PRBC's Given:? Date/# of units: not seen  ESA dose for discharge: mircera per protocal  mcg IV q 2 weeks  IV Iron dose at discharge: no  Heparin change: no  EDW Change: yes New EDW: 79kg  Bath Change: no  Access intervention/Change: no Details:  Hectorol/Calcitriol change: no  Discharge Labs: Calcium9.1 Phosphorus 4.8 Albumin 2.5 K+ 4.1  IV Antibiotics: no Details:  On Coumadin?: no Last INR: Next INR: Managed By:   OTHER/APPTS/LAB ORDERS:    D/C Meds to be reconciled by nurse after every discharge.  Completed By:   Reviewed by: MD:______ RN_______

## 2023-09-03 NOTE — Progress Notes (Addendum)
      [address]       [place] [address]             [phone]      9 Days Post-Op Procedure(s) (LRB): AORTIC ARCH DEBRANCHING USING 12X8X8MM HEMASHIELD GRAFT (N/A) TRANSESOPHAGEAL ECHOCARDIOGRAM (N/A) THORACIC AORTIC ENDOVASCULAR STENT GRAFT (N/A) ULTRASOUND GUIDANCE FOR VASCULAR ACCESS, RIGHT FEMORAL ARTERY (Right)  Subjective:  Patient doing much better pain control wise.  She feels up to going home today.  Objective: Vital signs in last 24 hours: Temp:  [97.7 F (36.5 C)-98.7 F (37.1 C)] 98 F (36.7 C) (11/17 0318) Pulse Rate:  [77-84] 80 (11/17 0749) Cardiac Rhythm: Normal sinus rhythm (11/16 2100) Resp:  [11-19] 11 (11/17 0749) BP: (108-143)/(48-67) 143/67 (11/17 0749) SpO2:  [92 %-100 %] 99 % (11/17 0905) Weight:  [78.7 kg] 78.7 kg (11/17 0318)  Intake/Output from previous day: 11/16 0701 - 11/17 0700 In: 220 [P.O.:220] Out: -   General appearance: alert, cooperative, and no distress Heart: regular rate and rhythm Lungs: clear to auscultation bilaterally Abdomen: soft, non-tender; bowel sounds normal; no masses,  no organomegaly Extremities: edema none present  Wound: clean and dry  Lab Results: Recent Labs    09/02/23 0323 09/03/23 0420  WBC 10.6* 11.1*  HGB 9.4* 10.1*  HCT 29.9* 31.2*  PLT 245 277   BMET:  Recent Labs    09/01/23 0901  NA 133*  K 4.1  CL 95*  CO2 25  GLUCOSE 156*  BUN 45*  CREATININE 6.67*  CALCIUM 9.1    PT/INR: No results for input(s): "LABPROT", "INR" in the last 72 hours. ABG    Component Value Date/Time   PHART 7.376 08/26/2023 1412   HCO3 20.9 08/26/2023 1412   TCO2 22 08/26/2023 1412   ACIDBASEDEF 4.0 (H) 08/26/2023 1412   O2SAT 92 08/26/2023 1412   CBG (last 3)  Recent Labs    09/02/23 2109 09/03/23 0545 09/03/23 0830  GLUCAP 126* 85 161*    Assessment/Plan: S/P Procedure(s) (LRB): AORTIC ARCH DEBRANCHING USING 12X8X8MM HEMASHIELD GRAFT (N/A) TRANSESOPHAGEAL ECHOCARDIOGRAM  (N/A) THORACIC AORTIC ENDOVASCULAR STENT GRAFT (N/A) ULTRASOUND GUIDANCE FOR VASCULAR ACCESS, RIGHT FEMORAL ARTERY (Right)  CV- NSR. BP is stable- continue Coreg, Norvasc, Cozaar LUE DVT-continue Eliquis Renal- ESRD- will resume HD MWF starting tomorrow Pain- control- much improved with Robaxin HIV- antivirals resumed Dispo- patient stable will d/c home today   LOS: 9 days    Lowella Dandy, PA-C 09/03/2023  Agree with above.

## 2023-09-03 NOTE — Progress Notes (Signed)
[facility] Progress Note  Subjective: seen in room. No c/o's. May be going home today.   Vitals:   09/02/23 2300 09/03/23 0318 09/03/23 0749 09/03/23 0905  BP: (!) 116/53 (!) 125/55 (!) 143/67   Pulse: 80  80   Resp: 19 16 11    Temp: 98.7 F (37.1 C) 98 F (36.7 C)    TempSrc: Oral Oral    SpO2: 95% 96% 100% 99%  Weight:  78.7 kg    Height:        Exam: Gen: Awake/alert and responsive, on RA CVS: RRR, no RG Resp: Anteriorly clear to auscultation Abd: Soft, obese, nontender, bowel sounds normal Ext: No lower extremity edema  HD access: Right IJ TDC intact   Renal-related home meds - norvasc 10 - coreg 25 bid - auryxia 1 ac tid - losartan 50 every day    OP HD: MWF SW  4h  400/600   80.1kg  2/2 baht  RIJ TDC  Heparin 5000 - hectorol 2 mcg    CXR 11/12- clear CXR    Assessment/ Plan: Status post thoracic aortic endovascular graft placement: 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 Per TCTS.  ESKD: usual HD MWF. Had HD here Tues/ thurs/ Friday this past week. Next HD Monday.  Volume: last CXR clear on 11/12. Is 2kg under, has lost some body weight. Euvolemic on exam. Make new dry wt 78- 79kg upon dc.  Anemia eskd: Hb stable now in 9- 11 range. Sp prbc's.  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 Cont IV vdra tiw. HIV infection: Antiretroviral therapy resumed. HTN: back taking her 3 home HTN medications. BP's controlled. Back pain: a bit better today, may be going home Dispo: possible dc today      Vinson Moselle MD  [facility] 09/03/2023, 10:51 AM  Recent Labs  Lab 08/29/23 0946 08/30/23 0242 09/01/23 0901 09/02/23 0323 09/03/23 0420  HGB  --    < >  --  9.4* 10.1*  ALBUMIN 2.4*  --  2.5*  --   --   CALCIUM 8.6*  --  9.1  --   --   PHOS 8.0*  --  4.8*  --   --   CREATININE 7.34*  --  6.67*  --   --   K 4.1  --  4.1  --   --    < > = values in this interval not displayed.   No results for input(s):  "IRON", "TIBC", "FERRITIN" in the last 168 hours. Inpatient medications:  allopurinol  100 mg Oral Daily   amLODipine  10 mg Oral Daily   apixaban  5 mg Oral BID   aspirin EC  81 mg Oral Daily   budesonide  0.25 mg Nebulization BID   carvedilol  6.25 mg Oral BID WC   Chlorhexidine Gluconate Cloth  6 each Topical Q0600   Chlorhexidine Gluconate Cloth  6 each Topical Q0600   docusate sodium  200 mg Oral Daily   dolutegravir  50 mg Oral Daily   doxercalciferol  2 mcg Intravenous Q T,Th,Sa-HD   emtricitabine-tenofovir AF  1 tablet Oral Daily   ferric citrate  210 mg Oral TID WC   insulin aspart  0-24 Units Subcutaneous TID WC   levothyroxine  175 mcg Oral Daily   losartan  50 mg Oral Daily   magic mouthwash  5 mL Oral TID   minocycline  100 mg Oral Daily  pantoprazole  40 mg Oral Daily   rosuvastatin  10 mg Oral Daily      methocarbamol, ondansetron, mouth rinse, traMADol

## 2023-09-03 NOTE — TOC Transition Note (Addendum)
Transition of Care [facility]) - CM/SW Discharge Note   Patient Details  Name: [name] MRN: [id number] Date of Birth: [birth year]  [facility]) CM/SW Contact:  Ronny Bacon, RN Phone Number: 09/03/2023, 10:28 AM   Clinical Narrative:  Patient is being discharged today. Spoke with patient by phone, she did not make a decision on [facility] agency. Patient expresses no preference for [facility] agency at this time. HH PT/OT arranged through [facility] with [name].  RW, 3:1 ordered through [employer] with [employer].    Final next level of care: Home w Home Health Services Barriers to Discharge: No Barriers Identified   Patient Goals and CMS Choice CMS Medicare.gov Compare Post Acute Care list provided to:: Patient Choice offered to / list presented to : Patient  Discharge Placement                         Discharge Plan and Services Additional resources added to the After Visit Summary for     Discharge Planning Services: CM Consult Post Acute Care Choice: Home Health, Durable Medical Equipment            DME Agency: [employer] Date DME Agency Contacted: 08/31/23 Time DME Agency Contacted: 1502 Representative spoke with at DME Agency: Vaughan Basta HH Arranged: PT, OT HH Agency: [facility] Date [facility] Agency Contacted: 09/03/23 Time HH Agency Contacted: 1027 Representative spoke with at [facility] Agency: Kandee Keen  Social Determinants of Health (SDOH) Interventions SDOH Screenings   Food Insecurity: No Food Insecurity (08/29/2023)  Housing: Low Risk  (08/29/2023)  Transportation Needs: No Transportation Needs (08/29/2023)  Utilities: Not At Risk (08/29/2023)  Depression (PHQ2-9): Low Risk  (06/08/2023)  Financial Resource Strain: Medium Risk (05/07/2022)   Received from [facility]  Tobacco Use: Medium Risk (08/25/2023)     Readmission Risk Interventions    02/21/2023    4:05 PM  Readmission Risk Prevention Plan   Transportation Screening Complete  Medication Review (RN Care Manager) Complete  PCP or Specialist appointment within 3-5 days of discharge Complete  HRI or Home Care Consult Complete  SW Recovery Care/Counseling Consult Complete  Palliative Care Screening Not Applicable  Skilled Nursing Facility Not Applicable

## 2023-09-03 NOTE — Progress Notes (Signed)
Patient given discharge instructions medication list and follow up appointments. Tele were removed. All questions were answered. Patient has walker at bedside for home use. Will discharge home as ordered. Transported to exit via wheel chair and nursing staff. [name], [employer].

## 2023-09-04 ENCOUNTER — Telehealth: Payer: Self-pay | Admitting: Nurse Practitioner

## 2023-09-04 DIAGNOSIS — N2581 Secondary hyperparathyroidism of renal origin: Secondary | ICD-10-CM | POA: Diagnosis not present

## 2023-09-04 DIAGNOSIS — Z992 Dependence on renal dialysis: Secondary | ICD-10-CM | POA: Diagnosis not present

## 2023-09-04 DIAGNOSIS — N186 End stage renal disease: Secondary | ICD-10-CM | POA: Diagnosis not present

## 2023-09-04 NOTE — Telephone Encounter (Signed)
Transition of Care - Initial Contact from Inpatient Facility  Date of discharge: 09/03/2023  Date of contact: 09/04/2023  Method: Phone Spoke to: Patient  Patient contacted to discuss transition of care from recent inpatient hospitalization. Patient was admitted to [facility] from 11/8-11/17/2024 with discharge diagnosis of Aortic arch aneurysm, s/p TEVAR for type B aortic dissection.   The discharge medication list was reviewed. Patient understands the changes and has no concerns. She says she is sore but otherwise feels OK.   Patient will return to his/her outpatient HD unit on: 09/04/2023  No other concerns at this time.

## 2023-09-04 NOTE — Progress Notes (Signed)
Late Note Entry- Sep 04, 2023  Pt was d/c yesterday. Contacted [facility] this morning to advise clinic of pt's d/c date and that pt should resume care today.   Olivia Canter Renal Navigator [phone]

## 2023-09-05 ENCOUNTER — Other Ambulatory Visit: Payer: Self-pay | Admitting: Primary Care

## 2023-09-05 DIAGNOSIS — J4530 Mild persistent asthma, uncomplicated: Secondary | ICD-10-CM

## 2023-09-06 DIAGNOSIS — Z992 Dependence on renal dialysis: Secondary | ICD-10-CM | POA: Diagnosis not present

## 2023-09-06 DIAGNOSIS — N2581 Secondary hyperparathyroidism of renal origin: Secondary | ICD-10-CM | POA: Diagnosis not present

## 2023-09-06 DIAGNOSIS — N186 End stage renal disease: Secondary | ICD-10-CM | POA: Diagnosis not present

## 2023-09-06 MED FILL — Electrolyte-R (PH 7.4) Solution: INTRAVENOUS | Qty: 2000 | Status: AC

## 2023-09-07 DIAGNOSIS — E1122 Type 2 diabetes mellitus with diabetic chronic kidney disease: Secondary | ICD-10-CM | POA: Diagnosis not present

## 2023-09-07 DIAGNOSIS — K219 Gastro-esophageal reflux disease without esophagitis: Secondary | ICD-10-CM | POA: Diagnosis not present

## 2023-09-07 DIAGNOSIS — Z48812 Encounter for surgical aftercare following surgery on the circulatory system: Secondary | ICD-10-CM | POA: Diagnosis not present

## 2023-09-07 DIAGNOSIS — Z4802 Encounter for removal of sutures: Secondary | ICD-10-CM

## 2023-09-07 DIAGNOSIS — I120 Hypertensive chronic kidney disease with stage 5 chronic kidney disease or end stage renal disease: Secondary | ICD-10-CM | POA: Diagnosis not present

## 2023-09-07 DIAGNOSIS — N186 End stage renal disease: Secondary | ICD-10-CM | POA: Diagnosis not present

## 2023-09-07 DIAGNOSIS — D631 Anemia in chronic kidney disease: Secondary | ICD-10-CM | POA: Diagnosis not present

## 2023-09-07 DIAGNOSIS — M109 Gout, unspecified: Secondary | ICD-10-CM | POA: Diagnosis not present

## 2023-09-07 DIAGNOSIS — G4730 Sleep apnea, unspecified: Secondary | ICD-10-CM | POA: Diagnosis not present

## 2023-09-07 DIAGNOSIS — Z992 Dependence on renal dialysis: Secondary | ICD-10-CM | POA: Diagnosis not present

## 2023-09-08 DIAGNOSIS — N186 End stage renal disease: Secondary | ICD-10-CM | POA: Diagnosis not present

## 2023-09-08 DIAGNOSIS — N2581 Secondary hyperparathyroidism of renal origin: Secondary | ICD-10-CM | POA: Diagnosis not present

## 2023-09-08 DIAGNOSIS — Z992 Dependence on renal dialysis: Secondary | ICD-10-CM | POA: Diagnosis not present

## 2023-09-10 DIAGNOSIS — N186 End stage renal disease: Secondary | ICD-10-CM | POA: Diagnosis not present

## 2023-09-10 DIAGNOSIS — N2581 Secondary hyperparathyroidism of renal origin: Secondary | ICD-10-CM | POA: Diagnosis not present

## 2023-09-10 DIAGNOSIS — Z992 Dependence on renal dialysis: Secondary | ICD-10-CM | POA: Diagnosis not present

## 2023-09-12 DIAGNOSIS — N2581 Secondary hyperparathyroidism of renal origin: Secondary | ICD-10-CM | POA: Diagnosis not present

## 2023-09-12 DIAGNOSIS — Z992 Dependence on renal dialysis: Secondary | ICD-10-CM | POA: Diagnosis not present

## 2023-09-12 DIAGNOSIS — N186 End stage renal disease: Secondary | ICD-10-CM | POA: Diagnosis not present

## 2023-09-15 ENCOUNTER — Emergency Department (HOSPITAL_BASED_OUTPATIENT_CLINIC_OR_DEPARTMENT_OTHER)
Admission: EM | Admit: 2023-09-15 | Discharge: 2023-09-15 | Disposition: A | Discharge status: Home / Self Care | Payer: Medicare HMO | Attending: Emergency Medicine | Admitting: Emergency Medicine

## 2023-09-15 ENCOUNTER — Emergency Department (HOSPITAL_BASED_OUTPATIENT_CLINIC_OR_DEPARTMENT_OTHER): Payer: Medicare HMO | Admitting: Radiology

## 2023-09-15 ENCOUNTER — Other Ambulatory Visit: Payer: Self-pay

## 2023-09-15 ENCOUNTER — Encounter (HOSPITAL_BASED_OUTPATIENT_CLINIC_OR_DEPARTMENT_OTHER): Payer: Self-pay

## 2023-09-15 ENCOUNTER — Emergency Department (HOSPITAL_BASED_OUTPATIENT_CLINIC_OR_DEPARTMENT_OTHER): Payer: Medicare HMO

## 2023-09-15 DIAGNOSIS — N186 End stage renal disease: Secondary | ICD-10-CM | POA: Diagnosis not present

## 2023-09-15 DIAGNOSIS — Z21 Asymptomatic human immunodeficiency virus [HIV] infection status: Secondary | ICD-10-CM | POA: Insufficient documentation

## 2023-09-15 DIAGNOSIS — R600 Localized edema: Secondary | ICD-10-CM | POA: Diagnosis not present

## 2023-09-15 DIAGNOSIS — I129 Hypertensive chronic kidney disease with stage 1 through stage 4 chronic kidney disease, or unspecified chronic kidney disease: Secondary | ICD-10-CM | POA: Diagnosis not present

## 2023-09-15 DIAGNOSIS — R0789 Other chest pain: Secondary | ICD-10-CM | POA: Diagnosis not present

## 2023-09-15 DIAGNOSIS — R935 Abnormal findings on diagnostic imaging of other abdominal regions, including retroperitoneum: Secondary | ICD-10-CM | POA: Diagnosis not present

## 2023-09-15 DIAGNOSIS — Z7982 Long term (current) use of aspirin: Secondary | ICD-10-CM | POA: Insufficient documentation

## 2023-09-15 DIAGNOSIS — Z85850 Personal history of malignant neoplasm of thyroid: Secondary | ICD-10-CM | POA: Insufficient documentation

## 2023-09-15 DIAGNOSIS — Z992 Dependence on renal dialysis: Secondary | ICD-10-CM | POA: Diagnosis not present

## 2023-09-15 DIAGNOSIS — N184 Chronic kidney disease, stage 4 (severe): Secondary | ICD-10-CM | POA: Diagnosis not present

## 2023-09-15 DIAGNOSIS — Z452 Encounter for adjustment and management of vascular access device: Secondary | ICD-10-CM | POA: Diagnosis not present

## 2023-09-15 DIAGNOSIS — N2581 Secondary hyperparathyroidism of renal origin: Secondary | ICD-10-CM | POA: Diagnosis not present

## 2023-09-15 DIAGNOSIS — E1122 Type 2 diabetes mellitus with diabetic chronic kidney disease: Secondary | ICD-10-CM | POA: Diagnosis not present

## 2023-09-15 DIAGNOSIS — M546 Pain in thoracic spine: Secondary | ICD-10-CM | POA: Diagnosis not present

## 2023-09-15 DIAGNOSIS — J9811 Atelectasis: Secondary | ICD-10-CM | POA: Diagnosis not present

## 2023-09-15 DIAGNOSIS — Z7901 Long term (current) use of anticoagulants: Secondary | ICD-10-CM | POA: Diagnosis not present

## 2023-09-15 DIAGNOSIS — J45909 Unspecified asthma, uncomplicated: Secondary | ICD-10-CM | POA: Diagnosis not present

## 2023-09-15 DIAGNOSIS — Z79899 Other long term (current) drug therapy: Secondary | ICD-10-CM | POA: Insufficient documentation

## 2023-09-15 DIAGNOSIS — D649 Anemia, unspecified: Secondary | ICD-10-CM | POA: Diagnosis not present

## 2023-09-15 DIAGNOSIS — N200 Calculus of kidney: Secondary | ICD-10-CM | POA: Diagnosis not present

## 2023-09-15 DIAGNOSIS — R079 Chest pain, unspecified: Secondary | ICD-10-CM | POA: Diagnosis not present

## 2023-09-15 DIAGNOSIS — J90 Pleural effusion, not elsewhere classified: Secondary | ICD-10-CM | POA: Diagnosis not present

## 2023-09-15 DIAGNOSIS — I7123 Aneurysm of the descending thoracic aorta, without rupture: Secondary | ICD-10-CM | POA: Diagnosis not present

## 2023-09-15 LAB — BASIC METABOLIC PANEL
Anion gap: 11 (ref 5–15)
BUN: 29 mg/dL — ABNORMAL HIGH (ref 6–20)
CO2: 31 mmol/L (ref 22–32)
Calcium: 8.4 mg/dL — ABNORMAL LOW (ref 8.9–10.3)
Chloride: 94 mmol/L — ABNORMAL LOW (ref 98–111)
Creatinine, Ser: 3.86 mg/dL — ABNORMAL HIGH (ref 0.44–1.00)
GFR, Estimated: 13 mL/min — ABNORMAL LOW (ref >60)
Glucose, Bld: 142 mg/dL — ABNORMAL HIGH (ref 70–99)
Potassium: 4 mmol/L (ref 3.5–5.1)
Sodium: 136 mmol/L (ref 135–145)

## 2023-09-15 LAB — TROPONIN I (HIGH SENSITIVITY)
Troponin I (High Sensitivity): 9 ng/L
Troponin I (High Sensitivity): 9 ng/L (ref <18)

## 2023-09-15 LAB — CBC
HCT: 26.1 % — ABNORMAL LOW (ref 36.0–46.0)
Hemoglobin: 8.3 g/dL — ABNORMAL LOW (ref 12.0–15.0)
MCH: 30.5 pg (ref 26.0–34.0)
MCHC: 31.8 g/dL (ref 30.0–36.0)
MCV: 96 fL (ref 80.0–100.0)
Platelets: 284 K/uL (ref 150–400)
RBC: 2.72 MIL/uL — ABNORMAL LOW (ref 3.87–5.11)
RDW: 18.3 % — ABNORMAL HIGH (ref 11.5–15.5)
WBC: 7.1 K/uL (ref 4.0–10.5)
nRBC: 0 % (ref 0.0–0.2)

## 2023-09-15 MED ORDER — OXYCODONE-ACETAMINOPHEN 5-325 MG PO TABS: 1.0000 | ORAL_TABLET | Freq: Three times a day (TID) | ORAL | 0 refills | Status: DC | PRN

## 2023-09-15 MED ORDER — IOHEXOL 350 MG/ML SOLN
100.0000 mL | Freq: Once | INTRAVENOUS | Status: AC | PRN
  Administered 2023-09-15: 100 mL via INTRAVENOUS

## 2023-09-15 MED ORDER — HYDROMORPHONE HCL 1 MG/ML IJ SOLN
0.5000 mg | Freq: Once | INTRAMUSCULAR | Status: AC
  Administered 2023-09-15: 0.5 mg via INTRAVENOUS
  Filled 2023-09-15: qty 1

## 2023-09-15 NOTE — ED Triage Notes (Signed)
She c/o shortness of breath combined with chest and thoracic pain radiating at times into her back x 3-4 days. She denies fever. She is ambulatory and in no distress.

## 2023-09-15 NOTE — ED Provider Notes (Signed)
[name] Provider Note   CSN: [id number] Arrival date & time: 09/15/23  1707     History  Chief Complaint  Patient presents with   Chest Pain    [name] is a 57 y.o. female.   Chest Pain Patient with chest pain.  Is had for last 3 to 4 days.  Does feel little short of breath.  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  Is on Eliquis.  No numbness or weakness.  States this is different pain than she was having postoperatively.  No swelling in her legs.  No numbness or weakness.    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  Genes Analyzed (43 total): APC*, ATM*, AXIN2, BARD1, BMPR1A, BRCA1*, BRCA2*, BRIP1*, CDH1*, CDK4, CDKN2A, CHEK2*, DICER1, GALNT12, HOXB13, MEN1, MLH1*, MRE11A, MSH2*, MSH3, MSH6*, MUTYH*, NBN, NF1*, NTHL1, PALB2*, PMS2*, POLD1, POLE, PTEN*, RAD50, RAD51C*, RAD51D*, RET, SDHB, SDHD, SMAD4, SMARCA4, STK11 and TP53* (sequencing and deletion/duplication); EGFR (s   GERD 07/24/2006   GRAVE'S DISEASE 01/01/2008   History of hidradenitis suppurativa    History of kidney stones    History of thrush    HIV DISEASE  07/24/2006   dx March 05   Hyperlipidemia    HYPERTENSION 07/24/2006   Hyperthyroidism 08/2006   Grave's Disease -diffuse radiotracer uptake 08/25/06 Thyroid scan-Cold nodule to R lower lobe of thyrorid   Ileus (HCC) 02/14/2023   Menometrorrhagia    hx of   Nephrolithiasis    Panniculitis 05/12/2020   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HCC)    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 Completed 2 yrs Prednisone after lung bx confirmation. No symptoms since then.   Sebaceous cyst 04/21/2020   Suppurative hidradenitis    Thyroid cancer (HCC)    THYROID NODULE, RIGHT 02/08/2007   Past Surgical History:  Procedure Laterality Date   AORTIC ARCH DEBRANCHING N/A 08/25/2023   Procedure: AORTIC ARCH DEBRANCHING USING 12X8X8MM HEMASHIELD GRAFT;  Surgeon: [name], MD;  Location: [facility];  Service: Open Heart Surgery;  Laterality: N/A;  with bypasses to the innominate and left common carotid arteries   APPLICATION OF WOUND  VAC N/A 01/20/2021   Procedure: APPLICATION OF WOUND VAC;  Surgeon: Peggye Form, DO;  Location: [facility];  Service: Plastics;  Laterality: N/A;   BREAST EXCISIONAL BIOPSY Right 04/26/2018   right axilla negative   BREAST EXCISIONAL BIOPSY Left 04/26/2018   left axilla negative   BREAST LUMPECTOMY Right 10/03/2018   malignant   BREAST LUMPECTOMY WITH RADIOACTIVE SEED AND SENTINEL LYMPH NODE BIOPSY Right 10/03/2018   Procedure: RIGHT BREAST LUMPECTOMY WITH RADIOACTIVE  SEED AND SENTINEL LYMPH NODE MAPPING;  Surgeon: Harriette Bouillon, MD;  Location: [facility];  Service: General;  Laterality: Right;   BREAST SURGERY  1997   Breast Reduction    CYSTOSCOPY W/ URETERAL STENT REMOVAL  11/09/2012   Procedure: CYSTOSCOPY WITH STENT REMOVAL;  Surgeon: Sebastian Ache, MD;  Location: [facility];  Service: Urology;  Laterality: Right;   CYSTOSCOPY WITH RETROGRADE PYELOGRAM, URETEROSCOPY AND STENT PLACEMENT  11/09/2012   Procedure: CYSTOSCOPY WITH RETROGRADE PYELOGRAM, URETEROSCOPY AND STENT PLACEMENT;  Surgeon: Sebastian Ache, MD;  Location: [facility];  Service: Urology;  Laterality: Left;  LEFT URETEROSCOPY, STONE MANIPULATION, left STENT exchange    CYSTOSCOPY WITH STENT PLACEMENT  10/02/2012   Procedure: CYSTOSCOPY WITH STENT PLACEMENT;  Surgeon: Sebastian Ache, MD;  Location: [facility];  Service: Urology;  Laterality: Left;   DEBRIDEMENT AND CLOSURE WOUND N/A 01/20/2021   Procedure: Excision of abdominal wound with closure;  Surgeon: Peggye Form, DO;  Location: [facility];  Service: Plastics;  Laterality: N/A;   DILATION AND CURETTAGE OF UTERUS  11/2002   s/p for 1st trimester nonviable pregnancy   EYE SURGERY     sty under eyelid   INCISE AND DRAIN ABCESS  08/2002   s/p I &D for righ inframmary fold hidradenitis   INCISION AND DRAINAGE PERITONSILLAR ABSCESS  12/2001   IR CV LINE INJECTION  06/07/2018   IR FLUORO GUIDE CV LINE RIGHT  01/30/2023   IR IMAGING GUIDED PORT INSERTION  06/20/2018   IR REMOVAL TUN ACCESS W/ PORT W/O FL MOD SED  06/20/2018   IR US GUIDE VASC ACCESS RIGHT  01/30/2023   IRRIGATION AND DEBRIDEMENT ABSCESS  01/31/2012   Procedure: IRRIGATION AND DEBRIDEMENT ABSCESS;  Surgeon: Kandis Cocking, MD;  Location: [facility];  Service: General;  Laterality: Right;  right breast and axilla    NEPHROLITHOTOMY  10/02/2012   Procedure: NEPHROLITHOTOMY PERCUTANEOUS;  Surgeon: Sebastian Ache, MD;  Location: [facility];  Service: Urology;  Laterality:  Right;  First Stage Percutaneous Nephrolithotomy with Surgeon Access, Left Ureteral Stent     NEPHROLITHOTOMY  10/04/2012   Procedure: NEPHROLITHOTOMY PERCUTANEOUS SECOND LOOK;  Surgeon: Sebastian Ache, MD;  Location: [facility];  Service: Urology;  Laterality: Right;      NEPHROLITHOTOMY  10/08/2012   Procedure: NEPHROLITHOTOMY PERCUTANEOUS;  Surgeon: Sebastian Ache, MD;  Location: [facility];  Service: Urology;  Laterality: Right;  THIRD STAGE, nephrostomy tube exchange x 2   NEPHROLITHOTOMY  10/11/2012   Procedure: NEPHROLITHOTOMY PERCUTANEOUS SECOND LOOK;  Surgeon: Sebastian Ache, MD;  Location: [facility];  Service: Urology;  Laterality: Right;  RIGHT 4 STAGE PERCUTANOUS NEPHROLITHOTOMY, right URETEROSCOPY WITH HOLMIUM LASER    PANNICULECTOMY N/A 12/21/2020   Procedure: PANNICULECTOMY;  Surgeon: Peggye Form, DO;  Location: [facility];  Service: Plastics;  Laterality: N/A;   PERCUTANEOUS NEPHROSTOLITHOTOMY  04/2022   PORT-A-CATH REMOVAL N/A 07/16/2020   Procedure: REMOVAL PORT-A-CATH;  Surgeon: Harriette Bouillon, MD;  Location: [facility];  Service: General;  Laterality: N/A;   PORTACATH PLACEMENT Left 05/17/2018   Procedure: INSERTION PORT-A-CATH;  Surgeon: Abigail Miyamoto, MD;  Location: [facility];  Service: General;  Laterality: Left;   RADICAL NECK DISSECTION  04/12/2017   limited/notes 04/12/2017   RADICAL NECK DISSECTION N/A 04/12/2017   Procedure: RADICAL NECK DISSECTION;  Surgeon: Christia Reading, MD;  [facility];  Service: ENT;  Laterality: N/A;  limited neck dissection 2 hours total   REDUCTION MAMMAPLASTY Bilateral 1998   RIGHT/LEFT HEART CATH AND CORONARY ANGIOGRAPHY N/A 03/12/2020   Procedure: RIGHT/LEFT HEART CATH AND CORONARY ANGIOGRAPHY;  Surgeon: Dolores Patty, MD;  [facility];  Service: Cardiovascular;  Laterality: N/A;   [place]  1994   TEE WITHOUT CARDIOVERSION N/A 08/25/2023   Procedure: TRANSESOPHAGEAL ECHOCARDIOGRAM;   Surgeon: [name], MD;  Location: [facility];  Service: Open Heart Surgery;  Laterality: N/A;   THORACIC AORTIC ENDOVASCULAR STENT GRAFT N/A 02/02/2023   Procedure: THORACIC AORTIC ENDOVASCULAR STENT GRAFT;  Surgeon: Victorino Sparrow, MD;  Location: [facility];  Service: Vascular;  Laterality: N/A;   THORACIC AORTIC ENDOVASCULAR STENT GRAFT N/A 08/25/2023   Procedure: THORACIC AORTIC ENDOVASCULAR STENT GRAFT;  Surgeon: Victorino Sparrow, MD;  Location: [facility] OR;  Service: Vascular;  Laterality: N/A;   THYROIDECTOMY  04/12/2017   completion/notes 04/12/2017   THYROIDECTOMY N/A 04/12/2017   Procedure: THYROIDECTOMY;  Surgeon: Christia Reading, MD;  Location: [facility] OR;  Service: ENT;  Laterality: N/A;  Completion Thyroidectomy   TOTAL THYROIDECTOMY  2010   ULTRASOUND GUIDANCE FOR VASCULAR ACCESS Right 08/25/2023   Procedure: ULTRASOUND GUIDANCE FOR VASCULAR ACCESS, RIGHT FEMORAL ARTERY;  Surgeon: Victorino Sparrow, MD;  Location: [facility] OR;  Service: Vascular;  Laterality: Right;    Home Medications Prior to Admission medications   Medication Sig Start Date End Date Taking? Authorizing Provider  acetaminophen (TYLENOL) 500 MG tablet Take 1,000 mg by mouth every 6 (six) hours as needed for mild pain.    [provider]  albuterol (VENTOLIN HFA) 108 (90 Base) MCG/ACT inhaler INHALE 2 PUFFS INTO THE LUNGS UP TO EVERY 6HRS AS NEEDED FOR WHEEZING/SHORTNESS OF BREATH 09/06/23   Doreene Nest, NP  allopurinol (ZYLOPRIM) 100 MG tablet TAKE 1 TABLET BY MOUTH EVERY DAY FOR GOUT PREVENTION 04/26/23   Doreene Nest, NP  amLODipine (NORVASC) 5 MG tablet Take 10 mg by mouth daily. 05/29/23   [provider]  apixaban (ELIQUIS) 5 MG TABS tablet Take 1 tablet (5 mg total) by mouth 2 (two) times daily. 09/03/23   Barrett, Rae Roam, PA-C  aspirin 81 MG chewable tablet Chew 81 mg by mouth daily.    [provider]  AURYXIA 1 GM 210 MG(Fe) tablet Take 210 mg by mouth 3 (three) times daily with meals.  04/05/23   [provider]  Blood Glucose Monitoring Suppl (FREESTYLE LITE) w/Device KIT Inject 1 each into the skin daily. 11/10/20   Chilton Si, MD  carvedilol (COREG) 6.25 MG tablet Take 1 tablet (6.25 mg total) by mouth 2 (two) times daily with a meal. 09/03/23   Barrett, Erin R, PA-C  dolutegravir ([name]) 50 MG tablet Take 1 tablet (50 mg total) by mouth daily. 06/08/23   Blanchard Kelch, NP  emtricitabine-tenofovir AF (DESCOVY) 200-25 MG tablet Take 1 tablet by mouth daily. 06/08/23   Blanchard Kelch, NP  fluticasone (FLOVENT HFA) 110 MCG/ACT inhaler Inhale 1 puff into the lungs 2 (two) times daily.    [provider]  Fluticasone Furoate (ARNUITY ELLIPTA) 100 MCG/ACT AEPB Inhale 1 puff into the lungs daily. 06/23/23   Doreene Nest, NP  Lancets [facility] ULTRASOFT) lancets Use as instructed to test blood sugar daily 11/25/20   Doreene Nest, NP  levocetirizine (XYZAL) 5 MG tablet Take 1 tablet (5 mg total) by mouth every evening. For allergies 04/27/23   Doreene Nest, NP  levothyroxine (SYNTHROID) 175 MCG tablet TAKE 1 TABLET BY MOUTH EVERY DAY 07/10/23   Carlus Pavlov, MD  losartan (COZAAR) 50 MG tablet Take 1 tablet (50 mg total) by mouth daily. for blood pressure. 03/21/23 08/25/23  Doreene Nest, NP  methocarbamol (ROBAXIN) 500 MG tablet Take 1 tablet (500 mg total) by mouth every 6 (six) hours as needed for muscle spasms. 09/03/23   Barrett, Erin R, PA-C  metoCLOPramide (REGLAN) 5 MG tablet Take 1 tablet (5 mg total) by mouth every 6 (six) hours as needed for nausea. 02/21/23   Jerald Kief, MD  minocycline (DYNACIN) 100 MG tablet Take 1 tablet (100 mg total) by mouth 2 (two) times daily. Patient taking differently: Take 100 mg by mouth daily. 06/09/22   Cliffton Asters, MD  omeprazole (PRILOSEC) 40 MG capsule Take 1 capsule (40 mg total) by mouth 2 (two) times daily before a meal. 12/19/22   Doree Albee, PA-C  ondansetron (ZOFRAN) 4 MG  tablet Take 1 tablet (4 mg total) by mouth daily as needed for nausea or vomiting. 02/21/23   Jerald Kief, MD  oxyCODONE-acetaminophen (PERCOCET/ROXICET) 5-325 MG tablet Take 1-2 tablets by mouth every 8 (eight) hours as needed for severe pain (pain score 7-10). 09/15/23   Benjiman Core, MD  rosuvastatin (CRESTOR) 10 MG tablet TAKE 1 TABLET BY MOUTH EVERY DAY FOR CHOLESTEROL 09/20/22   Doreene Nest, NP  saxagliptin HCl (ONGLYZA) 2.5 MG TABS tablet Take 1 tablet (2.5 mg total) by mouth daily. for diabetes. 03/07/23   Doreene Nest, NP  traMADol (ULTRAM) 50 MG tablet Take 1 tablet (50 mg total) by mouth every 6 (six) hours as needed for moderate pain. 02/21/23   Jerald Kief, MD  traMADol (ULTRAM) 50 MG tablet Take 1 tablet (50 mg total) by mouth every 6 (six) hours as needed for moderate pain (pain score 4-6). 09/03/23   Barrett, Rae Roam, PA-C      Allergies    Genvoya [elviteg-cobic-emtricit-tenofaf], Lisinopril, Aldactone [spironolactone], and Tegaderm ag mesh [silver]    Review of Systems   Review of Systems  Cardiovascular:  Positive for chest pain.    Physical Exam Updated Vital Signs BP (!) 159/72   Pulse 72   Temp 98.2 F (36.8 C)   Resp 16   LMP 03/31/2014 (LMP Unknown)   SpO2 94%  Physical Exam Vitals reviewed.  HENT:     Head: Normocephalic.  Cardiovascular:     Comments: Chest wound healing well. Chest:     Comments: Dialysis catheter right chest wall. Musculoskeletal:     Right lower leg: No edema.     Left lower leg: No edema.  Skin:    Capillary Refill: Capillary refill takes less than 2 seconds.  Neurological:     Mental Status: She is alert and oriented to person, place, and time.     ED Results / Procedures / Treatments   Labs (all labs ordered are listed, but only abnormal results are displayed) Labs Reviewed  BASIC METABOLIC PANEL - Abnormal; Notable for the following components:  Result Value   Chloride 94 (*)    Glucose, Bld 142  (*)    BUN 29 (*)    Creatinine, Ser 3.86 (*)    Calcium 8.4 (*)    GFR, Estimated 13 (*)    All other components within normal limits  CBC - Abnormal; Notable for the following components:   RBC 2.72 (*)    Hemoglobin 8.3 (*)    HCT 26.1 (*)    RDW 18.3 (*)    All other components within normal limits  TROPONIN I (HIGH SENSITIVITY)  TROPONIN I (HIGH SENSITIVITY)    EKG None  Radiology CT Angio Chest/Abd/Pel for Dissection W and/or Wo Contrast  Result Date: 09/15/2023 CLINICAL DATA:  Acute aortic syndrome suspected. EXAM: CT ANGIOGRAPHY CHEST, ABDOMEN AND PELVIS TECHNIQUE: Non-contrast CT of the chest was initially obtained. Multidetector CT imaging through the chest, abdomen and pelvis was performed using the standard protocol during bolus administration of intravenous contrast. Multiplanar reconstructed images and MIPs were obtained and reviewed to evaluate the vascular anatomy. RADIATION DOSE REDUCTION: This exam was performed according to the departmental dose-optimization program which includes automated exposure control, adjustment of the mA and/or kV according to patient size and/or use of iterative reconstruction technique. CONTRAST:  OMNIPAQUE IOHEXOL 350 MG/ML SOLN COMPARISON:  CT angiogram chest abdomen and pelvis 05/04/2023 FINDINGS: CTA CHEST FINDINGS Cardiovascular: Again seen is thoracic aortic graft which appears unchanged in size and position throughout the thoracic aorta. A second thoracic aortic graft has been placed in the interval. Origin of the great vessels appear patent similar to the prior study. Previously identified ulceration along the inferior margin of the aortic arch is no longer seen. Ascending aorta is nondilated. Descending thoracic aorta measures 5.1 cm at the level of the main pulmonary artery, slightly decreased in size descending thoracic aorta in the midportion measures up to 5.4 cm, unchanged. Dissection flap at the level of the distal thoracic  aorta appears unchanged from prior. Heart is mildly enlarged. There is no significant pericardial effusion. There is no central pulmonary embolism. Mediastinum/Nodes: Thyroid gland is surgically absent. Right-sided central venous catheter tip ends in the inferior right atrium. There are numerous nonenlarged mediastinal lymph nodes. Largest pre carinal lymph node measures 15 mm, slightly increased in size from prior. There are multiple nonenlarged paratracheal and prevascular lymph nodes which have increased in size and number visualized esophagus is within normal limits. Lungs/Pleura: There are atelectatic changes in both lung bases. There some scarring in the right middle lobe which is similar to prior. There is no new lung infiltrate, pleural effusion or pneumothorax. Musculoskeletal: Sternotomy wires are present, new from prior. There is mild edema in the anterior mediastinum just beneath the sternotomy without drainable fluid collection. There is left chest wall edema. Review of the MIP images confirms the above findings. 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 There is no evidence for aneurysm. Size and distribution is unchanged from prior. Celiac: Patent without evidence of aneurysm, dissection, vasculitis or significant stenosis. Arises from true lumen. SMA: Patent without evidence of aneurysm, dissection, vasculitis or significant stenosis. Arises from true lumen. Renals: Both renal arteries are patent without evidence of aneurysm, dissection, vasculitis, fibromuscular dysplasia or significant stenosis. Arises from true lumen. IMA: Patent without evidence of aneurysm, dissection, vasculitis or significant stenosis. Arises from false lumen. Inflow: Dissection extends into the left common iliac artery and left external iliac artery, unchanged. No evidence for aneurysm or occlusion.  Veins: No obvious venous abnormality within the limitations of this  arterial phase study. Review of the MIP images confirms the above findings. NON-VASCULAR Hepatobiliary: No focal liver abnormality is seen. No gallstones, gallbladder wall thickening, or biliary dilatation. Pancreas: There is a rounded hypodensity in the tail of the pancreas image 5/150 which is too small to characterize, new from prior. No pancreatic ductal dilatation or surrounding inflammatory changes. Spleen: Normal in size without focal abnormality. Adrenals/Urinary Tract: Left adrenal thickening is unchanged. Right adrenal gland is within normal limits. There is no hydronephrosis. Punctate right renal calculi are unchanged. Bladder is within normal limits. Stomach/Bowel: Stomach is within normal limits. Appendix appears normal. No evidence of bowel wall thickening, distention, or inflammatory changes. Lymphatic: No enlarged lymph nodes are seen. There are nonenlarged retroperitoneal lymph nodes. Reproductive: Uterus and bilateral adnexa are unremarkable. Other: No abdominal wall hernia or abnormality. No abdominopelvic ascites. Musculoskeletal: No acute or significant osseous findings. Review of the MIP images confirms the above findings. IMPRESSION: 1. Interval placement of a second thoracic aortic graft. Previously identified ulceration along the inferior margin of the aortic arch is no longer seen. 2. 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 3. Stable dissection of the thoracic and abdominal aorta. Dissection extends into the left common iliac artery and left external iliac artery, unchanged. 4. New rounded hypodensity in the tail of the pancreas which is too small to characterize. Recommend follow-up MRI in 6 months. 5. Nonenlarged mediastinal lymph nodes have increased in size and number. 6. Nonobstructing right renal calculi. 7. New sternotomy with mild amount of underlying mediastinal edema. No drainable fluid collection. 8. Left chest wall edema. Electronically Signed   By: [name] M.D.   On: 09/15/2023 19:27   DG Chest 2 View  Result Date: 09/15/2023 CLINICAL DATA:  Chest pain EXAM: CHEST - 2 VIEW COMPARISON:  X-ray 08/29/2023 and older. FINDINGS: Previous right IJ Cordis no longer seen. Stable double-lumen right IJ line with tip overlying the right atrium. Status post median sternotomy. Lung thoracic aortic endograft in place. Stable aortic shadow. Stable cardiac silhouette. Linear opacity lung bases likely scar or atelectasis. No pneumothorax, edema. Question tiny effusions overlapping cardiac leads. IMPRESSION: Postop changes with aortic endograft, sternal wires. Right IJ catheter. Basilar atelectasis.  Tiny pleural effusions. Electronically Signed   By: [name] M.D.   On: 09/15/2023 17:55    Procedures Procedures    Medications Ordered in ED Medications  iohexol (OMNIPAQUE) 350 MG/ML injection 100 mL (100 mLs Intravenous Contrast Given 09/15/23 1837)  HYDROmorphone (DILAUDID) injection 0.5 mg (0.5 mg Intravenous Given 09/15/23 2054)    ED Course/ Medical Decision Making/ A&P                                 Medical Decision Making Amount and/or Complexity of Data Reviewed Labs: ordered. Radiology: ordered.  Risk Prescription drug management.   Patient with chest pain.  Recent aortic dissection with repair.  Had deep branching.  Differential diagnosis does include aortic pathology, cardiac ischemia, pulmonary embolism although patient is on anticoagulation.  Will get CT angiography of the aorta and will get CT angiography also of the pulm area arteries if possible.  Will get basic blood work.  EKG shows left bundle branch which on most recent EKG was not present but has been present previously.  Reviewed recent discharge note.  Mild anemia worsened from before but unlikely cause.  CT scan of chest abdomen pelvis shows stable postsurgical changes of the aorta.  Does have pancreatic nodule but patient was already aware of.  Mild edema behind  the sternum.  Pain is more posterior.  Appears stable for discharge home.  Pain medicine given and feeling better.  Can follow-up as an outpatient with PCP and cardiothoracic.  Will discharge home.        Final Clinical Impression(s) / ED Diagnoses Final diagnoses:  Acute thoracic back pain, unspecified back pain laterality  Anemia, unspecified type    Rx / DC Orders ED Discharge Orders          Ordered    oxyCODONE-acetaminophen (PERCOCET/ROXICET) 5-325 MG tablet  Every 8 hours PRN,   Status:  Discontinued        09/15/23 2153    oxyCODONE-acetaminophen (PERCOCET/ROXICET) 5-325 MG tablet  Every 8 hours PRN        09/15/23 2219              Benjiman Core, MD 09/15/23 2329

## 2023-09-15 NOTE — Discharge Instructions (Addendum)
Your hemoglobin was low.  Follow-up with your doctor.  Also follow-up with your surgeon for the pain.

## 2023-09-16 DIAGNOSIS — E1122 Type 2 diabetes mellitus with diabetic chronic kidney disease: Secondary | ICD-10-CM | POA: Diagnosis not present

## 2023-09-16 DIAGNOSIS — N186 End stage renal disease: Secondary | ICD-10-CM | POA: Diagnosis not present

## 2023-09-16 DIAGNOSIS — Z992 Dependence on renal dialysis: Secondary | ICD-10-CM | POA: Diagnosis not present

## 2023-09-18 DIAGNOSIS — Z992 Dependence on renal dialysis: Secondary | ICD-10-CM | POA: Diagnosis not present

## 2023-09-18 DIAGNOSIS — N186 End stage renal disease: Secondary | ICD-10-CM | POA: Diagnosis not present

## 2023-09-18 DIAGNOSIS — N2581 Secondary hyperparathyroidism of renal origin: Secondary | ICD-10-CM | POA: Diagnosis not present

## 2023-09-19 DIAGNOSIS — Z48812 Encounter for surgical aftercare following surgery on the circulatory system: Secondary | ICD-10-CM | POA: Diagnosis not present

## 2023-09-19 DIAGNOSIS — Z992 Dependence on renal dialysis: Secondary | ICD-10-CM | POA: Diagnosis not present

## 2023-09-19 DIAGNOSIS — N186 End stage renal disease: Secondary | ICD-10-CM | POA: Diagnosis not present

## 2023-09-19 DIAGNOSIS — G4730 Sleep apnea, unspecified: Secondary | ICD-10-CM | POA: Diagnosis not present

## 2023-09-19 DIAGNOSIS — E1122 Type 2 diabetes mellitus with diabetic chronic kidney disease: Secondary | ICD-10-CM | POA: Diagnosis not present

## 2023-09-19 DIAGNOSIS — D631 Anemia in chronic kidney disease: Secondary | ICD-10-CM | POA: Diagnosis not present

## 2023-09-19 DIAGNOSIS — K219 Gastro-esophageal reflux disease without esophagitis: Secondary | ICD-10-CM | POA: Diagnosis not present

## 2023-09-19 DIAGNOSIS — M109 Gout, unspecified: Secondary | ICD-10-CM | POA: Diagnosis not present

## 2023-09-19 DIAGNOSIS — I120 Hypertensive chronic kidney disease with stage 5 chronic kidney disease or end stage renal disease: Secondary | ICD-10-CM | POA: Diagnosis not present

## 2023-09-20 DIAGNOSIS — Z992 Dependence on renal dialysis: Secondary | ICD-10-CM | POA: Diagnosis not present

## 2023-09-20 DIAGNOSIS — N2581 Secondary hyperparathyroidism of renal origin: Secondary | ICD-10-CM | POA: Diagnosis not present

## 2023-09-20 DIAGNOSIS — N186 End stage renal disease: Secondary | ICD-10-CM | POA: Diagnosis not present

## 2023-09-22 ENCOUNTER — Other Ambulatory Visit: Payer: Self-pay | Admitting: Primary Care

## 2023-09-22 DIAGNOSIS — N186 End stage renal disease: Secondary | ICD-10-CM | POA: Diagnosis not present

## 2023-09-22 DIAGNOSIS — E785 Hyperlipidemia, unspecified: Secondary | ICD-10-CM

## 2023-09-22 DIAGNOSIS — Z992 Dependence on renal dialysis: Secondary | ICD-10-CM | POA: Diagnosis not present

## 2023-09-22 DIAGNOSIS — N2581 Secondary hyperparathyroidism of renal origin: Secondary | ICD-10-CM | POA: Diagnosis not present

## 2023-09-25 DIAGNOSIS — Z992 Dependence on renal dialysis: Secondary | ICD-10-CM | POA: Diagnosis not present

## 2023-09-25 DIAGNOSIS — N2581 Secondary hyperparathyroidism of renal origin: Secondary | ICD-10-CM | POA: Diagnosis not present

## 2023-09-25 DIAGNOSIS — N186 End stage renal disease: Secondary | ICD-10-CM | POA: Diagnosis not present

## 2023-09-26 ENCOUNTER — Ambulatory Visit: Payer: Medicare HMO | Admitting: Infectious Diseases

## 2023-09-26 ENCOUNTER — Other Ambulatory Visit: Payer: Self-pay | Admitting: Surgery

## 2023-09-26 ENCOUNTER — Telehealth: Payer: Self-pay

## 2023-09-26 ENCOUNTER — Other Ambulatory Visit: Payer: Self-pay | Admitting: Surgical

## 2023-09-26 ENCOUNTER — Inpatient Hospital Stay: Payer: Medicare HMO | Attending: Hematology and Oncology | Admitting: Hematology and Oncology

## 2023-09-26 VITALS — BP 145/71 | HR 88 | Temp 98.3°F | Resp 16 | Wt 178.0 lb

## 2023-09-26 DIAGNOSIS — I7103 Dissection of thoracoabdominal aorta: Secondary | ICD-10-CM

## 2023-09-26 DIAGNOSIS — Z7982 Long term (current) use of aspirin: Secondary | ICD-10-CM | POA: Insufficient documentation

## 2023-09-26 DIAGNOSIS — Z170 Estrogen receptor positive status [ER+]: Secondary | ICD-10-CM | POA: Insufficient documentation

## 2023-09-26 DIAGNOSIS — Z79624 Long term (current) use of inhibitors of nucleotide synthesis: Secondary | ICD-10-CM | POA: Diagnosis not present

## 2023-09-26 DIAGNOSIS — Z79899 Other long term (current) drug therapy: Secondary | ICD-10-CM | POA: Diagnosis not present

## 2023-09-26 DIAGNOSIS — Z79810 Long term (current) use of selective estrogen receptor modulators (SERMs): Secondary | ICD-10-CM | POA: Diagnosis not present

## 2023-09-26 DIAGNOSIS — C50211 Malignant neoplasm of upper-inner quadrant of right female breast: Secondary | ICD-10-CM | POA: Diagnosis not present

## 2023-09-26 DIAGNOSIS — C50311 Malignant neoplasm of lower-inner quadrant of right female breast: Secondary | ICD-10-CM | POA: Diagnosis not present

## 2023-09-26 MED ORDER — TRAMADOL HCL 50 MG PO TABS: 50.0000 mg | ORAL_TABLET | Freq: Two times a day (BID) | ORAL | 0 refills | Status: AC | PRN

## 2023-09-26 NOTE — Progress Notes (Signed)
[facility]  [phone] [phone]    ID: [name] DOB: 12-07-1965  MR#: [id number]  ZHY#:[id number]  Patient Care Team: Doreene Nest, NP as PCP - General (Internal Medicine) Magrinat, Valentino Hue, MD (Inactive) as Consulting Physician (Oncology) Dorothy Puffer, MD as Consulting Physician (Radiation Oncology) Harriette Bouillon, MD as Consulting Physician (General Surgery) Berneice Heinrich Delbert Phenix., MD as Consulting Physician (Urology) Bensimhon, Bevelyn Buckles, MD as Consulting Physician (Cardiology) Dagoberto Ligas, MD as Consulting Physician (Nephrology) Romero Belling, MD (Inactive) as Consulting Physician (Endocrinology) Glendale Chard, DO as Consulting Physician (Neurology) Center, [facility], Gomez Cleverly, NP as Nurse Practitioner (Infectious Diseases) OTHER MD:   CHIEF COMPLAINT: Triple positive breast cancer  CURRENT TREATMENT: tamoxifen   INTERVAL HISTORY:  Discussed the use of AI scribe software for clinical note transcription with the patient, who gave verbal consent to proceed.  History of Present Illness    Jakayah returns today for follow-up of her triple positive breast cancer.    The patient, with a history of breast cancer and recent open heart surgery, presents for a follow-up visit. She reports having stopped taking tamoxifen, a medication prescribed for her breast cancer treatment, as she believed she had completed the course. However, the patient was unsure of the exact date she stopped the medication.  In addition to her breast cancer history, the patient underwent open heart surgery on November 8th following a heart dissection in April. She spent a month in the hospital and has been recovering at home since. She reports experiencing significant pain and difficulty with mobility since the surgery.  The patient also mentions being on dialysis and experiencing chronic constipation, for which she takes stool  softeners. She has not reported any issues with urination, headaches, double vision, or falls.  The patient was previously diagnosed with thyroid cancer in 2018. Rest of the pertinent 10 point ROS reviewed and neg.   COVID 19 VACCINATION STATUS: Pfizer x2, most recently 01/2020   HISTORY OF CURRENT ILLNESS: From the original intake note:  [name] (pronounced "Huff") palpated a mass in the right breast for about 2 weeks before bringing it to medical attention. She underwent bilateral diagnostic mammography with tomography and right breast ultrasonography at [facility] on 04/20/2018 showing: breast density category B. There was a highly suspicious mass located at 3 o'clock in the upper inner quadrant measuring 2.9 x 2.5 x 1.6 cm and 4 cm from the nipple. Ultrasound of the right axilla showed 5 lymph nodes with abnormal cortical thickening. The left axilla showed 3 lymph nodes with abnormal cortical thickening.  Accordingly on 04/26/2018 she proceeded to biopsy of the right breast area in question. The pathology from this procedure showed ([id number]): Invasive ductal carcinoma, grade II with calcifications. One right axillary lymph node and one left axillary lymph node were negative for carcinoma. Prognostic indicators significant for: estrogen receptor, 100% positive and progesterone receptor, 80% positive, both with strong staining intensity. Proliferation marker Ki67 at 70%. HER2 amplified with ratios HER2/CEP17 signals 4.52 and average HER2 copies per cell 10.85  The patient's subsequent history is as detailed below.   PAST MEDICAL HISTORY: Past Medical History:  Diagnosis Date   Acute pancreatitis 10/23/2021   Acute renal failure superimposed on stage 4 chronic kidney disease (HCC) 10/24/2021   Allergy    Anemia    Normocytic   Antibiotic-induced yeast infection 09/28/2020   Anxiety    Asthma    Bronchitis 2005  Bursitis of left shoulder 09/06/2019   CKD  (chronic kidney disease)    CLASS 1-EXOPHTHALMOS-THYROTOXIC 02/08/2007   COVID-19 long hauler 05/11/2021   COVID-19 virus infection 04/28/2022   Diabetes mellitus without complication [facility])    Dissecting abdominal aortic aneurysm (HCC) 02/05/2023   Encephalopathy acute 02/02/2023   Family history of breast cancer    Family history of lung cancer    Family history of prostate cancer    Gastroenteritis 07/10/2007   Genetic testing 07/25/2018   [employer] + RNA Insight was ordered.  Genes Analyzed (43 total): APC*, ATM*, AXIN2, BARD1, BMPR1A, BRCA1*, BRCA2*, BRIP1*, CDH1*, CDK4, CDKN2A, CHEK2*, DICER1, GALNT12, HOXB13, MEN1, MLH1*, MRE11A, MSH2*, MSH3, MSH6*, MUTYH*, NBN, NF1*, NTHL1, PALB2*, PMS2*, POLD1, POLE, PTEN*, RAD50, RAD51C*, RAD51D*, RET, SDHB, SDHD, SMAD4, SMARCA4, STK11 and TP53* (sequencing and deletion/duplication); EGFR (s   GERD 07/24/2006   GRAVE'S DISEASE 01/01/2008   History of hidradenitis suppurativa    History of kidney stones    History of thrush    HIV DISEASE 07/24/2006   dx March 05   Hyperlipidemia    HYPERTENSION 07/24/2006   Hyperthyroidism 08/2006   Grave's Disease -diffuse radiotracer uptake 08/25/06 Thyroid scan-Cold nodule to R lower lobe of thyrorid   Ileus (HCC) 02/14/2023   Menometrorrhagia    hx of   Nephrolithiasis    Panniculitis 05/12/2020   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HCC)    METASTATIC PAPILLARY THYROID CARCINOMA per 01/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 Completed 2 yrs Prednisone after lung bx  confirmation. No symptoms since then.   Sebaceous cyst 04/21/2020   Suppurative hidradenitis    Thyroid cancer (HCC)    THYROID NODULE, RIGHT 02/08/2007  -2018 thyroid nodules were precancerous but pathology was negative for thyroid cancer.    PAST SURGICAL HISTORY: Past Surgical History:  Procedure Laterality Date   AORTIC ARCH DEBRANCHING N/A 08/25/2023   Procedure: AORTIC ARCH DEBRANCHING USING 12X8X8MM HEMASHIELD GRAFT;  Surgeon: [name], MD;  Location: [facility];  Service: Open Heart Surgery;  Laterality: N/A;  with bypasses to the innominate and left common carotid arteries   APPLICATION OF WOUND VAC N/A 01/20/2021   Procedure: APPLICATION OF WOUND VAC;  Surgeon: Peggye Form, DO;  Location: [facility];  Service: Plastics;  Laterality: N/A;   BREAST EXCISIONAL BIOPSY Right 04/26/2018   right axilla negative   BREAST EXCISIONAL BIOPSY Left 04/26/2018   left axilla negative   BREAST LUMPECTOMY Right 10/03/2018   malignant   BREAST LUMPECTOMY WITH RADIOACTIVE SEED AND SENTINEL LYMPH NODE BIOPSY Right 10/03/2018   Procedure: RIGHT BREAST LUMPECTOMY WITH RADIOACTIVE SEED AND SENTINEL LYMPH NODE MAPPING;  Surgeon: Harriette Bouillon, MD;  Location: [facility] OR;  Service: General;  Laterality: Right;   BREAST SURGERY  1997   Breast Reduction    CYSTOSCOPY W/ URETERAL STENT REMOVAL  11/09/2012   Procedure: CYSTOSCOPY WITH STENT REMOVAL;  Surgeon: Sebastian Ache, MD;  Location: [facility];  Service: Urology;  Laterality: Right;   CYSTOSCOPY WITH RETROGRADE PYELOGRAM, URETEROSCOPY AND STENT PLACEMENT  11/09/2012   Procedure: CYSTOSCOPY WITH RETROGRADE PYELOGRAM, URETEROSCOPY AND STENT PLACEMENT;  Surgeon: Sebastian Ache, MD;  Location: [facility];  Service: Urology;  Laterality: Left;  LEFT URETEROSCOPY, STONE MANIPULATION, left STENT exchange    CYSTOSCOPY WITH STENT PLACEMENT  10/02/2012   Procedure: CYSTOSCOPY WITH STENT PLACEMENT;  Surgeon: Sebastian Ache, MD;  Location: [facility];  Service: Urology;  Laterality: Left;   DEBRIDEMENT AND CLOSURE WOUND N/A 01/20/2021   Procedure: Excision of abdominal wound with closure;  Surgeon: Peggye Form, DO;  Location: [facility];  Service: Plastics;  Laterality: N/A;   DILATION AND CURETTAGE OF UTERUS  11/2002   s/p for 1st trimester nonviable pregnancy   EYE SURGERY     sty under eyelid   INCISE AND DRAIN ABCESS  08/2002   s/p I &D for righ inframmary fold hidradenitis   INCISION AND DRAINAGE PERITONSILLAR ABSCESS  12/2001   IR CV LINE INJECTION  06/07/2018   IR FLUORO GUIDE CV LINE RIGHT  01/30/2023   IR IMAGING GUIDED PORT INSERTION  06/20/2018   IR REMOVAL TUN ACCESS W/ PORT W/O FL MOD SED  06/20/2018   IR US GUIDE VASC ACCESS RIGHT  01/30/2023   IRRIGATION AND DEBRIDEMENT ABSCESS  01/31/2012   Procedure: IRRIGATION AND DEBRIDEMENT ABSCESS;  Surgeon: Kandis Cocking, MD;  [facility];  Service: General;  Laterality: Right;  right breast and axilla    NEPHROLITHOTOMY  10/02/2012   Procedure: NEPHROLITHOTOMY PERCUTANEOUS;  Surgeon: Sebastian Ache, MD;  [facility]: [facility];  Service: Urology;  Laterality: Right;  First Stage Percutaneous Nephrolithotomy with Surgeon Access, Left Ureteral Stent     NEPHROLITHOTOMY  10/04/2012   Procedure: NEPHROLITHOTOMY PERCUTANEOUS SECOND LOOK;  Surgeon: Sebastian Ache, MD;  [facility];  Service: Urology;  Laterality: Right;      NEPHROLITHOTOMY  10/08/2012   Procedure: NEPHROLITHOTOMY PERCUTANEOUS;  Surgeon: Sebastian Ache, MD;  [facility];  Service: Urology;  Laterality: Right;  THIRD STAGE, nephrostomy tube exchange x 2   NEPHROLITHOTOMY  10/11/2012   Procedure: NEPHROLITHOTOMY PERCUTANEOUS SECOND LOOK;  Surgeon: Sebastian Ache, MD;  [facility];  Service: Urology;  Laterality: Right;  RIGHT 4 STAGE PERCUTANOUS NEPHROLITHOTOMY, right URETEROSCOPY WITH HOLMIUM LASER    PANNICULECTOMY N/A 12/21/2020   Procedure: PANNICULECTOMY;  Surgeon:  Peggye Form, DO;  [facility]: [facility];  Service: Plastics;  Laterality: N/A;   PERCUTANEOUS NEPHROSTOLITHOTOMY  04/2022   PORT-A-CATH REMOVAL N/A 07/16/2020   Procedure: REMOVAL PORT-A-CATH;  Surgeon: Harriette Bouillon, MD;  Location: [facility];  Service: General;  Laterality: N/A;   PORTACATH PLACEMENT Left 05/17/2018   Procedure: INSERTION PORT-A-CATH;  Surgeon: Abigail Miyamoto, MD;  Location: [facility];  Service: General;  Laterality: Left;   RADICAL NECK DISSECTION  04/12/2017   limited/notes 04/12/2017   RADICAL NECK DISSECTION N/A 04/12/2017   Procedure: RADICAL NECK DISSECTION;  Surgeon: Christia Reading, MD;  Location: [facility];  Service: ENT;  Laterality: N/A;  limited neck dissection 2 hours total   REDUCTION MAMMAPLASTY Bilateral 1998   RIGHT/LEFT HEART CATH AND CORONARY ANGIOGRAPHY N/A 03/12/2020   Procedure: RIGHT/LEFT HEART CATH AND CORONARY ANGIOGRAPHY;  Surgeon: Dolores Patty, MD;  [facility];  Service: Cardiovascular;  Laterality: N/A;   [place]  1994   TEE WITHOUT CARDIOVERSION N/A 08/25/2023   Procedure: TRANSESOPHAGEAL ECHOCARDIOGRAM;  Surgeon: [name], MD;  Location: [facility];  Service: Open Heart Surgery;  Laterality: N/A;   THORACIC  AORTIC ENDOVASCULAR STENT GRAFT N/A 02/02/2023   Procedure: THORACIC AORTIC ENDOVASCULAR STENT GRAFT;  Surgeon: Victorino Sparrow, MD;  Location: [facility] OR;  Service: Vascular;  Laterality: N/A;   THORACIC AORTIC ENDOVASCULAR STENT GRAFT N/A 08/25/2023   Procedure: THORACIC AORTIC ENDOVASCULAR STENT GRAFT;  Surgeon: Victorino Sparrow, MD;  Location: [facility] OR;  Service: Vascular;  Laterality: N/A;   THYROIDECTOMY  04/12/2017   completion/notes 04/12/2017   THYROIDECTOMY N/A 04/12/2017   Procedure: THYROIDECTOMY;  Surgeon: Christia Reading, MD;  Location: [facility] OR;  Service: ENT;  Laterality: N/A;  Completion Thyroidectomy   TOTAL THYROIDECTOMY  2010   ULTRASOUND GUIDANCE FOR VASCULAR ACCESS Right  08/25/2023   Procedure: ULTRASOUND GUIDANCE FOR VASCULAR ACCESS, RIGHT FEMORAL ARTERY;  Surgeon: Victorino Sparrow, MD;  Location: [facility] OR;  Service: Vascular;  Laterality: Right;  Thyroid nodules removed- pathology at the ENT doctor 2018 were benign -Over active thyroid and thyroidectomy with benign pathology   FAMILY HISTORY Family History  Problem Relation Age of Onset   Hypertension Mother    Cancer Mother        laryngeal   Heart disease Mother        stent   Pancreatic cancer Father    Hypertension Father    Lung cancer Father 73       hx smoking   Hypertension Sister    Hypertension Sister    Hypertension Brother    Breast cancer Maternal Aunt 56   Breast cancer Maternal Aunt        dx 60+   Cancer Maternal Uncle        Lung CA   Breast cancer Paternal Aunt 36   Breast cancer Paternal Aunt        dx 65's   Breast cancer Paternal Aunt        dx 50's   Prostate cancer Paternal Uncle    Prostate cancer Paternal Uncle    Lung cancer Paternal Uncle    Breast cancer Cousin 69   Breast cancer Cousin        dx <50   Breast cancer Cousin        dx <50   Breast cancer Cousin        dx <50   Heart disease Other    Hypertension Other    Stroke Other        Grandparent   Kidney disease Other        Grandparent   Diabetes Other        FH of Diabetes   Colon cancer Neg Hx    Esophageal cancer Neg Hx    Rectal cancer Neg Hx    Stomach cancer Neg Hx   The patient's mother is alive at age 15. The patient's father died at 60 from lung cancer (heavy smoker). She does not know much about her father. The patient has 1 brother and 2 sisters. The patient's mother had a history of laryngeal cancer. There was a maternal cousin with breast cancer diagnosed at age 42. There were 2 maternal aunts with breast cancer, the youngest being diagnosed in the 81's. There was a maternal great aunt with breast cancer. There were 2 paternal aunts with breast cancer.    GYNECOLOGIC HISTORY:   Patient's last menstrual period was 03/31/2014 (lmp unknown). Menarche: 57 years old Age at first live birth: 57 years old She is GX P1.  Her LMP was in 2017. She did not use HRT.    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 She is single. At home is the patient's daughter, Gilda Crease, age 20, is a Runner, broadcasting/film/video and also works in the summers and Clinical biochemist.  The patient attends [employer].   ADVANCED DIRECTIVES: Not in place; at the 05/10/2018 visit the patient was given the appropriate documents to complete and notarized at her discretion   HEALTH MAINTENANCE: Social History   Tobacco Use   Smoking status: Former    Current packs/day: 0.00    Average packs/day: 0.5 packs/day for 15.0 years (7.5 ttl pk-yrs)    Types: Cigarettes    Start date: 04/12/2017    Quit date: 2019    Years since quitting: 5.9   Smokeless tobacco: Never  Vaping Use   Vaping status: Never Used  Substance Use Topics   Alcohol use: Yes    Alcohol/week: 0.0 standard drinks of alcohol    Comment: social   Drug use: No     Colonoscopy: no  PAP: 09/2020, negative  Bone density: remote   Allergies  Allergen Reactions   Genvoya [Elviteg-Cobic-Emtricit-Tenofaf] Hives   Lisinopril Cough   Aldactone [Spironolactone] Hives   Tegaderm Ag Mesh [Silver] Itching    Current Outpatient Medications  Medication Sig Dispense Refill   acetaminophen (TYLENOL) 500 MG tablet Take 1,000 mg by mouth every 6 (six) hours as needed for mild pain.     albuterol (VENTOLIN HFA) 108 (90 Base) MCG/ACT inhaler INHALE 2 PUFFS INTO THE LUNGS UP TO EVERY 6HRS AS NEEDED FOR WHEEZING/SHORTNESS OF BREATH 8 each 0   allopurinol (ZYLOPRIM) 100 MG tablet TAKE 1 TABLET BY MOUTH EVERY DAY FOR GOUT PREVENTION 90 tablet 2   amLODipine (NORVASC) 5 MG tablet Take 10 mg by mouth daily.     apixaban (ELIQUIS) 5 MG TABS tablet Take 1 tablet (5 mg total) by mouth 2 (two)  times daily. 60 tablet 3   aspirin 81 MG chewable tablet Chew 81 mg by mouth daily.     AURYXIA 1 GM 210 MG(Fe) tablet Take 210 mg by mouth 3 (three) times daily with meals.     Blood Glucose Monitoring Suppl (FREESTYLE LITE) w/Device KIT Inject 1 each into the skin daily. 1 kit 0   carvedilol (COREG) 6.25 MG tablet Take 1 tablet (6.25 mg total) by mouth 2 (two) times daily with a meal. 60 tablet 3   dolutegravir ([name]) 50 MG tablet Take 1 tablet (50 mg total) by mouth daily. 30 tablet 11   emtricitabine-tenofovir AF (DESCOVY) 200-25 MG tablet Take 1 tablet by mouth daily. 30 tablet 11   fluticasone (FLOVENT HFA) 110 MCG/ACT inhaler Inhale 1 puff into the lungs 2 (two) times daily.     Fluticasone Furoate (ARNUITY ELLIPTA) 100 MCG/ACT AEPB Inhale 1 puff into the lungs daily. 90 each 1   Lancets (ONETOUCH ULTRASOFT) lancets Use as instructed to test blood sugar daily 100 each 5   levocetirizine (XYZAL) 5 MG tablet Take 1 tablet (5 mg total) by mouth every evening. For allergies 90 tablet 3   levothyroxine (SYNTHROID) 175 MCG tablet TAKE 1 TABLET BY MOUTH EVERY DAY 90 tablet 3   losartan (COZAAR) 50 MG tablet Take 1 tablet (50 mg total) by mouth daily. for blood pressure. 90 tablet 3   methocarbamol (ROBAXIN) 500 MG tablet Take 1 tablet (500 mg total) by mouth every 6 (six) hours as needed for muscle spasms. 30 tablet 0   metoCLOPramide (REGLAN) 5  MG tablet Take 1 tablet (5 mg total) by mouth every 6 (six) hours as needed for nausea. 20 tablet 0   minocycline (DYNACIN) 100 MG tablet Take 1 tablet (100 mg total) by mouth 2 (two) times daily. (Patient taking differently: Take 100 mg by mouth daily.) 60 tablet 11   omeprazole (PRILOSEC) 40 MG capsule Take 1 capsule (40 mg total) by mouth 2 (two) times daily before a meal. 180 capsule 0   ondansetron (ZOFRAN) 4 MG tablet Take 1 tablet (4 mg total) by mouth daily as needed for nausea or vomiting. 20 tablet 0   oxyCODONE-acetaminophen (PERCOCET/ROXICET)  5-325 MG tablet Take 1-2 tablets by mouth every 8 (eight) hours as needed for severe pain (pain score 7-10). 6 tablet 0   rosuvastatin (CRESTOR) 10 MG tablet TAKE 1 TABLET BY MOUTH EVERY DAY FOR CHOLESTEROL 90 tablet 0   saxagliptin HCl (ONGLYZA) 2.5 MG TABS tablet Take 1 tablet (2.5 mg total) by mouth daily. for diabetes. 90 tablet 1   traMADol (ULTRAM) 50 MG tablet Take 1 tablet (50 mg total) by mouth every 6 (six) hours as needed for moderate pain. 20 tablet 0   traMADol (ULTRAM) 50 MG tablet Take 1 tablet (50 mg total) by mouth every 6 (six) hours as needed for moderate pain (pain score 4-6). 30 tablet 0   No current facility-administered medications for this visit.    OBJECTIVE: African-American woman   Vitals:   09/26/23 1055  BP: (!) 145/71  Pulse: 88  Resp: 16  Temp: 98.3 F (36.8 C)  SpO2: 99%   Wt Readings from Last 3 Encounters:  09/26/23 178 lb (80.7 kg)  09/03/23 173 lb 6.4 oz (78.7 kg)  08/23/23 179 lb 3.2 oz (81.3 kg)   Body mass index is 29.62 kg/m.    ECOG FS:1 - Symptomatic but completely ambulatory  Physical Exam Constitutional:      Appearance: Normal appearance.  Chest:     Comments: Bilateral breasts inspected. Rt breast post lumpectomy. Severe HS changes noted causing some dysfiguring and lumpiness in the inferior portion of the breast. No overt masses. Left breast normal to inspection and palpation. No regional adenopathy. I refrained from deep palpation since she was very sore with her CABG scar. Musculoskeletal:     Cervical back: Normal range of motion and neck supple. No rigidity.  Lymphadenopathy:     Cervical: No cervical adenopathy.  Neurological:     Mental Status: She is alert.       LAB RESULTS:  CMP     Component Value Date/Time   NA 136 09/15/2023 1811   NA 142 12/05/2022 0000   K 4.0 09/15/2023 1811   CL 94 (L) 09/15/2023 1811   CO2 31 09/15/2023 1811   GLUCOSE 142 (H) 09/15/2023 1811   BUN 29 (H) 09/15/2023 1811   BUN 44  (A) 12/05/2022 0000   CREATININE 3.86 (H) 09/15/2023 1811   CREATININE 2.72 (H) 07/27/2021 1110   CREATININE 2.74 (H) 03/17/2021 1050   CALCIUM 8.4 (L) 09/15/2023 1811   PROT 6.6 08/28/2023 0203   ALBUMIN 2.5 (L) 09/01/2023 0901   AST 19 08/28/2023 0203   AST 28 07/27/2021 1110   ALT <5 08/28/2023 0203   ALT 19 07/27/2021 1110   ALKPHOS 86 08/28/2023 0203   BILITOT 0.7 08/28/2023 0203   BILITOT 0.3 07/27/2021 1110   GFRNONAA 13 (L) 09/15/2023 1811   GFRNONAA 20 (L) 07/27/2021 1110   GFRAA 39 (L) 07/14/2020 1053  GFRAA 33 (L) 08/12/2019 1447    No results found for: "TOTALPROTELP", "ALBUMINELP", "A1GS", "A2GS", "BETS", "BETA2SER", "GAMS", "MSPIKE", "SPEI"  No results found for: "KPAFRELGTCHN", "LAMBDASER", "KAPLAMBRATIO"  Lab Results  Component Value Date   WBC 7.1 09/15/2023   NEUTROABS 13.8 (H) 08/28/2023   HGB 8.3 (L) 09/15/2023   HCT 26.1 (L) 09/15/2023   MCV 96.0 09/15/2023   PLT 284 09/15/2023   No results found for: "LABCA2"  No components found for: "QIHKVQ259"  No results for input(s): "INR" in the last 168 hours.  No results found for: "LABCA2"  No results found for: "DGL875"  No results found for: "CAN125"  No results found for: "CAN153"  No results found for: "CA2729"  No components found for: "HGQUANT"  No results found for: "CEA1", "CEA" / No results found for: "CEA1", "CEA"   No results found for: "AFPTUMOR"  No results found for: "CHROMOGRNA"  No results found for: "HGBA", "HGBA2QUANT", "HGBFQUANT", "HGBSQUAN" (Hemoglobinopathy evaluation)   No results found for: "LDH"  Lab Results  Component Value Date   IRON 31 02/07/2023   TIBC 204 (L) 02/07/2023   IRONPCTSAT 15 02/07/2023   (Iron and TIBC)  Lab Results  Component Value Date   FERRITIN 622 (H) 02/07/2023    Urinalysis    Component Value Date/Time   COLORURINE YELLOW 07/11/2023 1450   APPEARANCEUR HAZY (A) 07/11/2023 1450   LABSPEC 1.013 07/11/2023 1450   PHURINE 5.0  07/11/2023 1450   GLUCOSEU NEGATIVE 07/11/2023 1450   GLUCOSEU NEG mg/dL 64/33/2951 8841   HGBUR NEGATIVE 07/11/2023 1450   BILIRUBINUR NEGATIVE 07/11/2023 1450   BILIRUBINUR neg 10/19/2022 1340   KETONESUR NEGATIVE 07/11/2023 1450   PROTEINUR >=300 (A) 07/11/2023 1450   UROBILINOGEN 0.2 10/19/2022 1340   UROBILINOGEN 0.2 10/21/2014 2347   NITRITE NEGATIVE 07/11/2023 1450   LEUKOCYTESUR SMALL (A) 07/11/2023 1450    STUDIES: CT Angio Chest/Abd/Pel for Dissection W and/or Wo Contrast  Result Date: 09/15/2023 CLINICAL DATA:  Acute aortic syndrome suspected. EXAM: CT ANGIOGRAPHY CHEST, ABDOMEN AND PELVIS TECHNIQUE: Non-contrast CT of the chest was initially obtained. Multidetector CT imaging through the chest, abdomen and pelvis was performed using the standard protocol during bolus administration of intravenous contrast. Multiplanar reconstructed images and MIPs were obtained and reviewed to evaluate the vascular anatomy. RADIATION DOSE REDUCTION: This exam was performed according to the departmental dose-optimization program which includes automated exposure control, adjustment of the mA and/or kV according to patient size and/or use of iterative reconstruction technique. CONTRAST:  OMNIPAQUE IOHEXOL 350 MG/ML SOLN COMPARISON:  CT angiogram chest abdomen and pelvis 05/04/2023 FINDINGS: CTA CHEST FINDINGS Cardiovascular: Again seen is thoracic aortic graft which appears unchanged in size and position throughout the thoracic aorta. A second thoracic aortic graft has been placed in the interval. Origin of the great vessels appear patent similar to the prior study. Previously identified ulceration along the inferior margin of the aortic arch is no longer seen. Ascending aorta is nondilated. Descending thoracic aorta measures 5.1 cm at the level of the main pulmonary artery, slightly decreased in size descending thoracic aorta in the midportion measures up to 5.4 cm, unchanged. Dissection flap at  the level of the distal thoracic aorta appears unchanged from prior. Heart is mildly enlarged. There is no significant pericardial effusion. There is no central pulmonary embolism. Mediastinum/Nodes: Thyroid gland is surgically absent. Right-sided central venous catheter tip ends in the inferior right atrium. There are numerous nonenlarged mediastinal lymph nodes. Largest pre carinal lymph node  measures 15 mm, slightly increased in size from prior. There are multiple nonenlarged paratracheal and prevascular lymph nodes which have increased in size and number visualized esophagus is within normal limits. Lungs/Pleura: There are atelectatic changes in both lung bases. There some scarring in the right middle lobe which is similar to prior. There is no new lung infiltrate, pleural effusion or pneumothorax. Musculoskeletal: Sternotomy wires are present, new from prior. There is mild edema in the anterior mediastinum just beneath the sternotomy without drainable fluid collection. There is left chest wall edema. Review of the MIP images confirms the above findings. 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 There is no evidence for aneurysm. Size and distribution is unchanged from prior. Celiac: Patent without evidence of aneurysm, dissection, vasculitis or significant stenosis. Arises from true lumen. SMA: Patent without evidence of aneurysm, dissection, vasculitis or significant stenosis. Arises from true lumen. Renals: Both renal arteries are patent without evidence of aneurysm, dissection, vasculitis, fibromuscular dysplasia or significant stenosis. Arises from true lumen. IMA: Patent without evidence of aneurysm, dissection, vasculitis or significant stenosis. Arises from false lumen. Inflow: Dissection extends into the left common iliac artery and left external iliac artery, unchanged. No evidence for aneurysm or occlusion. Veins: No obvious venous abnormality  within the limitations of this arterial phase study. Review of the MIP images confirms the above findings. NON-VASCULAR Hepatobiliary: No focal liver abnormality is seen. No gallstones, gallbladder wall thickening, or biliary dilatation. Pancreas: There is a rounded hypodensity in the tail of the pancreas image 5/150 which is too small to characterize, new from prior. No pancreatic ductal dilatation or surrounding inflammatory changes. Spleen: Normal in size without focal abnormality. Adrenals/Urinary Tract: Left adrenal thickening is unchanged. Right adrenal gland is within normal limits. There is no hydronephrosis. Punctate right renal calculi are unchanged. Bladder is within normal limits. Stomach/Bowel: Stomach is within normal limits. Appendix appears normal. No evidence of bowel wall thickening, distention, or inflammatory changes. Lymphatic: No enlarged lymph nodes are seen. There are nonenlarged retroperitoneal lymph nodes. Reproductive: Uterus and bilateral adnexa are unremarkable. Other: No abdominal wall hernia or abnormality. No abdominopelvic ascites. Musculoskeletal: No acute or significant osseous findings. Review of the MIP images confirms the above findings. IMPRESSION: 1. Interval placement of a second thoracic aortic graft. Previously identified ulceration along the inferior margin of the aortic arch is no longer seen. 2. 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 3. Stable dissection of the thoracic and abdominal aorta. Dissection extends into the left common iliac artery and left external iliac artery, unchanged. 4. New rounded hypodensity in the tail of the pancreas which is too small to characterize. Recommend follow-up MRI in 6 months. 5. Nonenlarged mediastinal lymph nodes have increased in size and number. 6. Nonobstructing right renal calculi. 7. New sternotomy with mild amount of underlying mediastinal edema. No drainable fluid collection. 8. Left chest wall edema.  Electronically Signed   By: Darliss Cheney M.D.   On: 09/15/2023 19:27   DG Chest 2 View  Result Date: 09/15/2023 CLINICAL DATA:  Chest pain EXAM: CHEST - 2 VIEW COMPARISON:  X-ray 08/29/2023 and older. FINDINGS: Previous right IJ Cordis no longer seen. Stable double-lumen right IJ line with tip overlying the right atrium. Status post median sternotomy. Lung thoracic aortic endograft in place. Stable aortic shadow. Stable cardiac silhouette. Linear opacity lung bases likely scar or atelectasis. No pneumothorax, edema. Question tiny effusions overlapping cardiac leads. IMPRESSION: Postop changes with aortic endograft,  sternal wires. Right IJ catheter. Basilar atelectasis.  Tiny pleural effusions. Electronically Signed   By: Karen Kays M.D.   On: 09/15/2023 17:55   VAS [place] UPPER EXTREMITY VENOUS DUPLEX  Result Date: 08/29/2023 UPPER VENOUS STUDY  Patient Name:  [name] [facility]  Date of Exam:   08/29/2023 Medical Rec #: [id number]              Accession #:    [id number] Date of Birth: [birth year]              Patient Gender: F Patient Age:   57 years Exam Location:  [facility] Procedure:      VAS [place] UPPER EXTREMITY VENOUS DUPLEX Referring Phys: Ivin Booty ROBINS --------------------------------------------------------------------------------  Indications: Left arm swelling Other Indications: Status post Thoracic Aortic Endovascular Stent Graft on 08/25/23. Performing Technologist: Marilynne Halsted RDMS, RVT  Examination Guidelines: A complete evaluation includes B-mode imaging, spectral Doppler, color Doppler, and power Doppler as needed of all accessible portions of each vessel. Bilateral testing is considered an integral part of a complete examination. Limited examinations for reoccurring indications may be performed as noted.  Right Findings: +----------+------------+---------+-----------+----------+-------+ RIGHT     CompressiblePhasicitySpontaneousPropertiesSummary  +----------+------------+---------+-----------+----------+-------+ Subclavian               Yes       Yes                      +----------+------------+---------+-----------+----------+-------+  Left Findings: +----------+------------+---------+-----------+----------+-------+ LEFT      CompressiblePhasicitySpontaneousPropertiesSummary +----------+------------+---------+-----------+----------+-------+ IJV           None       No        No                Acute  +----------+------------+---------+-----------+----------+-------+ Subclavian    None       No        No                Acute  +----------+------------+---------+-----------+----------+-------+ Axillary      None       No        No                Acute  +----------+------------+---------+-----------+----------+-------+ Brachial    Partial                                  Acute  +----------+------------+---------+-----------+----------+-------+ Radial        Full                                          +----------+------------+---------+-----------+----------+-------+ Ulnar         Full                                          +----------+------------+---------+-----------+----------+-------+ Cephalic      Full                                          +----------+------------+---------+-----------+----------+-------+ Basilic       Full                                          +----------+------------+---------+-----------+----------+-------+  One of the paired brachial veins.  Summary:  Right: No evidence of thrombosis in the subclavian.  Left: Findings consistent with acute deep vein thrombosis involving the left internal jugular vein, left brachial veins, left axillary vein and left subclavian vein.  *See table(s) above for measurements and observations.  Diagnosing physician: Lemar Livings MD Electronically signed by Lemar Livings MD on 08/29/2023 at 7:01:45 PM.    Final    DG CHEST PORT 1  VIEW  Result Date: 08/29/2023 CLINICAL DATA:  Encounter for chest tube removal. EXAM: PORTABLE CHEST 1 VIEW COMPARISON:  08/27/2023 FINDINGS: The tube at the left chest base has been removed. Patchy densities in both lung bases are suggestive for atelectasis. Negative for pneumothorax. Right jugular dialysis catheter tip in the right atrium. Additional right jugular central venous catheter is present. Aortic stent graft involving the thoracic aorta. IMPRESSION: 1. Removal of the chest tube without pneumothorax. 2. Patchy densities at both lung bases are suggestive for atelectasis. Electronically Signed   By: Richarda Overlie M.D.   On: 08/29/2023 13:17     ELIGIBLE FOR AVAILABLE RESEARCH PROTOCOL: declined DCP study  ASSESSMENT: 57 y.o. [place], [place] woman status post right breast upper inner quadrant biopsy 04/26/2018 for a clinical T2 N0, stage IB invasive ductal carcinoma, grade 2, ER/PR positive, HER-2 positive with an MIB-1 of 70%.  (1) genetics testing 07/03/2018: no pathogenic mutations. Genes Analyzed (43 total): APC*, ATM*, AXIN2, BARD1, BMPR1A, BRCA1*, BRCA2*, BRIP1*, CDH1*, CDK4, CDKN2A, CHEK2*, DICER1,GALNT12, HOXB13, MEN1, MLH1*, MRE11A, MSH2*, MSH3, MSH6*, MUTYH*, NBN, NF1*, NTHL1, PALB2*, PMS2*, POLD1, POLE,PTEN*, RAD50, RAD51C*, RAD51D*, RET, SDHB, SDHD, SMAD4, SMARCA4, STK11 and TP53* (sequencing and deletion/duplication); EGFR (sequencing only); EPCAM and GREM1 (deletion/duplication only). DNA and RNA analyses performed for * genes.  (2) neoadjuvant chemotherapy consisting of carboplatin, docetaxel, trastuzumab and pertuzumab given every 21 days x 6, starting 05/31/2018, completed 09/14/2018  (a) Pertuzumab discontinued starting with cycle 2 because of diarrhea problems  (b) Taxotere stopped after 4 cycles due peripheral neuropathy and substituted for last two cycles with gemcitabine  (3)  definitive surgery on 10/03/2018 showed residual ypT1c ypN0 invasive ductal carcinoma, grade 2, with  negative margins; a total of 5 sentinel lymph nodes removed; repeat prognostic panel again triple positive  (4) T-DM1 given adjuvantly due to residual disease: started 11/13/2018, completed 11/04/2019.  (a) echo on 05/16/2018 shows an EF of 60-65%.  (b) echo on 08/27/18 shows an EF 55-60%  (c) echo on 12/03/2018 shows an EF of 60-65%  (d) echocardiogram on 03/06/2019 showed an ejection fraction of 55% as read by Dr. Gala Romney.  (e) echocardiogram 06/20/2019 shows an ejection fraction in the 55-60% range  (f) echo 02/20/2020 shows an ejection fraction in the 60-65% range.  (5) adjuvant radiation completed 01/07/2019 - 02/21/2019 (delayed due to wound healing issues)   Site/dose:   The patient initially received a dose of 50.4 Gy in 28 fractions to the right breast using whole-breast tangent fields. This was delivered using a 3-D conformal technique. The patient then received a boost to the seroma. This delivered an additional 10 Gy in 5 fractions using 12e electrons with a special teletherapy technique. The total dose was 60.4 Gy.  (6) tamoxifen started 02/28/2019  (7) hidradenitis/boils: on doxycycline as needed   PLAN:  Breast Cancer Patient stopped Tamoxifen prematurely, but with recent major cardiac issues, decision to not resume. No palpable lumps on exam, but due to pain and recent surgery, exam was limited. -Order screening mammogram for February 2025.  Post-Operative Recovery from Open Heart Surgery Patient recently had surgery for aortic dissection and is in the recovery phase with significant pain. Patient is on dialysis. -Encourage patient to continue with cardiac rehabilitation at home and follow up with cardiology. -Advise patient to contact her primary care provider or surgeon regarding pain management, as she has run out of Tramadol.  Constipation Patient reports constipation, currently taking stool softeners intermittently. -Advise patient to take stool softeners on regular  basis to avoid bouts of constipation.  Follow-up Plan to see patient in one year, or sooner if needed. Total time spent: 30 minutes  *Total Encounter Time as defined by the Centers for Medicare and Medicaid Services includes, in addition to the face-to-face time of a patient visit (documented in the note above) non-face-to-face time: obtaining and reviewing outside history, ordering and reviewing medications, tests or procedures, care coordination (communications with other health care professionals or caregivers) and documentation in the medical record.

## 2023-09-26 NOTE — Telephone Encounter (Signed)
Patient contacted the office requesting a refill of Tramadol. She states that she took her last pill Sunday. She states she is taking about 2 pills a day. One in the morning and once before bed. She states this is back pain related to surgery. Tramadol refill per Gershon Crane, PA and sent into patient's preferred pharmacy. She does follow-up with Dr. Laneta Simmers in the office tomorrow and is aware of her chest xray done before.

## 2023-09-26 NOTE — Progress Notes (Signed)
Ultram 50 mg po q 12 hours prn #14 ordered for pain  Rowe Clack, PA-C

## 2023-09-27 ENCOUNTER — Ambulatory Visit
Admission: RE | Admit: 2023-09-27 | Discharge: 2023-09-27 | Disposition: A | Discharge status: Home / Self Care | Payer: Medicare HMO | Source: Ambulatory Visit | Attending: Surgery | Admitting: Surgery

## 2023-09-27 ENCOUNTER — Ambulatory Visit (INDEPENDENT_AMBULATORY_CARE_PROVIDER_SITE_OTHER): Payer: Self-pay | Admitting: Surgery

## 2023-09-27 VITALS — BP 153/86 | HR 86 | Resp 20 | Wt 178.0 lb

## 2023-09-27 DIAGNOSIS — J9811 Atelectasis: Secondary | ICD-10-CM | POA: Diagnosis not present

## 2023-09-27 DIAGNOSIS — N186 End stage renal disease: Secondary | ICD-10-CM | POA: Diagnosis not present

## 2023-09-27 DIAGNOSIS — Z09 Encounter for follow-up examination after completed treatment for conditions other than malignant neoplasm: Secondary | ICD-10-CM

## 2023-09-27 DIAGNOSIS — I7103 Dissection of thoracoabdominal aorta: Secondary | ICD-10-CM

## 2023-09-27 DIAGNOSIS — N2581 Secondary hyperparathyroidism of renal origin: Secondary | ICD-10-CM | POA: Diagnosis not present

## 2023-09-27 DIAGNOSIS — Z992 Dependence on renal dialysis: Secondary | ICD-10-CM | POA: Diagnosis not present

## 2023-09-27 NOTE — Progress Notes (Signed)
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 She previously underwent TEVAR by Dr. 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  She had an uncomplicated postoperative course after her recent procedure and was discharged home with resumption of her outpatient dialysis.  She is here today with her mother.  She just got finished dialysis so she is tired but overall has made a good recovery.  She no longer has any pain in her left upper extremity and feels like the swelling has resolved.  She still has some occasional mid back pain.  Current Outpatient Medications  Medication Sig Dispense Refill   acetaminophen (TYLENOL) 500 MG tablet Take 1,000 mg by mouth every 6 (six) hours as needed for mild pain.     albuterol (VENTOLIN HFA) 108 (90 Base) MCG/ACT inhaler INHALE 2 PUFFS INTO THE LUNGS UP TO EVERY 6HRS AS NEEDED FOR WHEEZING/SHORTNESS OF BREATH 8 each 0   allopurinol (ZYLOPRIM) 100 MG tablet TAKE 1 TABLET BY MOUTH EVERY DAY FOR GOUT PREVENTION 90 tablet 2   amLODipine (NORVASC) 5 MG tablet Take 10 mg by mouth daily.     apixaban (ELIQUIS) 5 MG TABS tablet Take 1 tablet (5 mg total) by mouth 2 (two) times daily. 60 tablet 3   aspirin 81 MG chewable tablet Chew 81 mg by mouth daily.     AURYXIA 1 GM 210 MG(Fe) tablet Take 210 mg by mouth 3 (three) times daily with  meals.     Blood Glucose Monitoring Suppl (FREESTYLE LITE) w/Device KIT Inject 1 each into the skin daily. 1 kit 0   carvedilol (COREG) 6.25 MG tablet Take 1 tablet (6.25 mg total) by mouth 2 (two) times daily with a meal. 60 tablet 3   dolutegravir ([name]) 50 MG tablet Take 1 tablet (50 mg total) by mouth daily. 30 tablet 11   emtricitabine-tenofovir AF (DESCOVY) 200-25 MG tablet Take 1 tablet by mouth daily. 30 tablet 11   fluticasone (FLOVENT HFA) 110 MCG/ACT inhaler Inhale 1 puff into the lungs 2 (two) times daily.     Fluticasone Furoate (ARNUITY ELLIPTA) 100 MCG/ACT AEPB Inhale 1 puff into the lungs daily. 90 each 1   Lancets (ONETOUCH ULTRASOFT) lancets Use as instructed to test blood sugar daily 100 each 5   levocetirizine (XYZAL) 5 MG tablet Take 1 tablet (5 mg total) by mouth every evening. For allergies 90 tablet 3   levothyroxine (SYNTHROID) 175 MCG tablet TAKE 1 TABLET BY MOUTH EVERY DAY 90 tablet 3   losartan (COZAAR) 50 MG tablet Take 1 tablet (50 mg total) by mouth daily. for blood pressure. 90 tablet 3   methocarbamol (ROBAXIN) 500 MG tablet Take 1 tablet (500 mg total) by mouth every 6 (six) hours as needed for muscle spasms. 30 tablet 0   metoCLOPramide (REGLAN) 5 MG tablet Take 1 tablet (5 mg total) by mouth  every 6 (six) hours as needed for nausea. 20 tablet 0   minocycline (DYNACIN) 100 MG tablet Take 1 tablet (100 mg total) by mouth 2 (two) times daily. (Patient taking differently: Take 100 mg by mouth daily.) 60 tablet 11   omeprazole (PRILOSEC) 40 MG capsule Take 1 capsule (40 mg total) by mouth 2 (two) times daily before a meal. 180 capsule 0   ondansetron (ZOFRAN) 4 MG tablet Take 1 tablet (4 mg total) by mouth daily as needed for nausea or vomiting. 20 tablet 0   oxyCODONE-acetaminophen (PERCOCET/ROXICET) 5-325 MG tablet Take 1-2 tablets by mouth every 8 (eight) hours as needed for severe pain (pain score 7-10). 6 tablet 0   rosuvastatin (CRESTOR) 10 MG tablet TAKE 1  TABLET BY MOUTH EVERY DAY FOR CHOLESTEROL 90 tablet 0   saxagliptin HCl (ONGLYZA) 2.5 MG TABS tablet Take 1 tablet (2.5 mg total) by mouth daily. for diabetes. 90 tablet 1   traMADol (ULTRAM) 50 MG tablet Take 1 tablet (50 mg total) by mouth every 12 (twelve) hours as needed for up to 7 days for moderate pain (pain score 4-6). 14 tablet 0   No current facility-administered medications for this visit.     Physical Exam: BP (!) 153/86   Pulse 86   Resp 20   Wt 178 lb (80.7 kg)   LMP 03/31/2014 (LMP Unknown)   SpO2 95% Comment: RA  BMI 29.62 kg/m  She looks well. Cardiac exam shows a regular rate and rhythm with normal heart sounds.  There is no murmur. Lungs are clear. The chest incision is healing well and the sternum is stable. There is no peripheral edema.  Her left upper extremity appears back to normal.  Diagnostic Tests:  Narrative & Impression  CLINICAL DATA:  Acute aortic syndrome suspected.   EXAM: CT ANGIOGRAPHY CHEST, ABDOMEN AND PELVIS   TECHNIQUE: Non-contrast CT of the chest was initially obtained.   Multidetector CT imaging through the chest, abdomen and pelvis was performed using the standard protocol during bolus administration of intravenous contrast. Multiplanar reconstructed images and MIPs were obtained and reviewed to evaluate the vascular anatomy.   RADIATION DOSE REDUCTION: This exam was performed according to the departmental dose-optimization program which includes automated exposure control, adjustment of the mA and/or kV according to patient size and/or use of iterative reconstruction technique.   CONTRAST:  OMNIPAQUE IOHEXOL 350 MG/ML SOLN   COMPARISON:  CT angiogram chest abdomen and pelvis 05/04/2023   FINDINGS: CTA CHEST FINDINGS   Cardiovascular: Again seen is thoracic aortic graft which appears unchanged in size and position throughout the thoracic aorta. A second thoracic aortic graft has been placed in the interval.  Origin of the great vessels appear patent similar to the prior study. Previously identified ulceration along the inferior margin of the aortic arch is no longer seen. Ascending aorta is nondilated. Descending thoracic aorta measures 5.1 cm at the level of the main pulmonary artery, slightly decreased in size descending thoracic aorta in the midportion measures up to 5.4 cm, unchanged. Dissection flap at the level of the distal thoracic aorta appears unchanged from prior.   Heart is mildly enlarged. There is no significant pericardial effusion. There is no central pulmonary embolism.   Mediastinum/Nodes: Thyroid gland is surgically absent. Right-sided central venous catheter tip ends in the inferior right atrium. There are numerous nonenlarged mediastinal lymph nodes. Largest pre carinal lymph node measures 15 mm, slightly increased in size from prior. There are multiple nonenlarged paratracheal  and prevascular lymph nodes which have increased in size and number visualized esophagus is within normal limits.   Lungs/Pleura: There are atelectatic changes in both lung bases. There some scarring in the right middle lobe which is similar to prior. There is no new lung infiltrate, pleural effusion or pneumothorax.   Musculoskeletal: Sternotomy wires are present, new from prior. There is mild edema in the anterior mediastinum just beneath the sternotomy without drainable fluid collection. There is left chest wall edema.   Review of the MIP images confirms the above findings.   CTA ABDOMEN AND PELVIS FINDINGS   VASCULAR   Aorta: Diffuse abdominal aortic dissection is again seen. There is no evidence for aneurysm. Size and distribution is unchanged from prior.   Celiac: Patent without evidence of aneurysm, dissection, vasculitis or significant stenosis. Arises from true lumen.   SMA: Patent without evidence of aneurysm, dissection, vasculitis or significant stenosis. Arises from  true lumen.   Renals: Both renal arteries are patent without evidence of aneurysm, dissection, vasculitis, fibromuscular dysplasia or significant stenosis. Arises from true lumen.   IMA: Patent without evidence of aneurysm, dissection, vasculitis or significant stenosis. Arises from false lumen.   Inflow: Dissection extends into the left common iliac artery and left external iliac artery, unchanged. No evidence for aneurysm or occlusion.   Veins: No obvious venous abnormality within the limitations of this arterial phase study.   Review of the MIP images confirms the above findings.   NON-VASCULAR   Hepatobiliary: No focal liver abnormality is seen. No gallstones, gallbladder wall thickening, or biliary dilatation.   Pancreas: There is a rounded hypodensity in the tail of the pancreas image 5/150 which is too small to characterize, new from prior. No pancreatic ductal dilatation or surrounding inflammatory changes.   Spleen: Normal in size without focal abnormality.   Adrenals/Urinary Tract: Left adrenal thickening is unchanged. Right adrenal gland is within normal limits. There is no hydronephrosis. Punctate right renal calculi are unchanged. Bladder is within normal limits.   Stomach/Bowel: Stomach is within normal limits. Appendix appears normal. No evidence of bowel wall thickening, distention, or inflammatory changes.   Lymphatic: No enlarged lymph nodes are seen. There are nonenlarged retroperitoneal lymph nodes.   Reproductive: Uterus and bilateral adnexa are unremarkable.   Other: No abdominal wall hernia or abnormality. No abdominopelvic ascites.   Musculoskeletal: No acute or significant osseous findings.   Review of the MIP images confirms the above findings.   IMPRESSION: 1. Interval placement of a second thoracic aortic graft. Previously identified ulceration along the inferior margin of the aortic arch is no longer seen. 2. Stable aneurysmal  dilatation of the descending thoracic aorta. 3. Stable dissection of the thoracic and abdominal aorta. Dissection extends into the left common iliac artery and left external iliac artery, unchanged. 4. New rounded hypodensity in the tail of the pancreas which is too small to characterize. Recommend follow-up MRI in 6 months. 5. Nonenlarged mediastinal lymph nodes have increased in size and number. 6. Nonobstructing right renal calculi. 7. New sternotomy with mild amount of underlying mediastinal edema. No drainable fluid collection. 8. Left chest wall edema.     Electronically Signed   By: Darliss Cheney M.D.   On: 09/15/2023 19:27       Narrative & Impression CLINICAL DATA:  Chest pain status post sternotomy.   EXAM: CHEST - 2 VIEW   COMPARISON:  September 15, 2023.   FINDINGS: Stable cardiomediastinal silhouette. Stable stent graft seen involving descending thoracic  aorta. Right internal jugular dialysis catheter is unchanged. Mild right basilar subsegmental atelectasis is noted. Minimal left basilar scarring or subsegmental atelectasis is noted. Bony thorax is unremarkable.   IMPRESSION: Mild right basilar subsegmental atelectasis is noted. Minimal left basilar scarring or subsegmental atelectasis is noted.     Electronically Signed   By: Lupita Raider M.D.   On: 09/27/2023 16:36   Impression:  Overall I think she is making a good recovery 1 month after her surgery especially considering her comorbid risk factors.  She had her CTA done on 09/15/2023 after presenting to the emergency room with some chest and back pain.  This showed stable placement of the thoracic stent graft with the previously seen ulceration along the inferior margin of the aortic arch no longer visible.  There is stable aneurysmal dilation of the descending thoracic aorta and stable dissection of the thoracic and abdominal aorta beyond the stent graft.  I told her that she could return to driving  a car when she feels comfortable with that.  I asked her not to do any heavy lifting of more than 10 pounds for 3 months postoperatively.  I reviewed the importance of continued good blood pressure control and preventing further aortic dissections.  She said that her blood pressure has actually been running on the low side and she discontinued the losartan that she was sent home on.  She has continued to take her Norvasc and Coreg.  She was having some dizziness with the low blood pressures prior to discontinuing the losartan but that has since resolved.   Plan:  She is going to follow-up with her PCP for her general medical care.  She is going to follow-up with Dr. Karin Lieu concerning her thoracic stent graft.  She will return to see me as the need arises.   [name], MD [facility] [phone]

## 2023-09-28 DIAGNOSIS — M109 Gout, unspecified: Secondary | ICD-10-CM | POA: Diagnosis not present

## 2023-09-28 DIAGNOSIS — G4730 Sleep apnea, unspecified: Secondary | ICD-10-CM | POA: Diagnosis not present

## 2023-09-28 DIAGNOSIS — D631 Anemia in chronic kidney disease: Secondary | ICD-10-CM | POA: Diagnosis not present

## 2023-09-28 DIAGNOSIS — Z48812 Encounter for surgical aftercare following surgery on the circulatory system: Secondary | ICD-10-CM | POA: Diagnosis not present

## 2023-09-28 DIAGNOSIS — E1122 Type 2 diabetes mellitus with diabetic chronic kidney disease: Secondary | ICD-10-CM | POA: Diagnosis not present

## 2023-09-28 DIAGNOSIS — Z992 Dependence on renal dialysis: Secondary | ICD-10-CM | POA: Diagnosis not present

## 2023-09-28 DIAGNOSIS — K219 Gastro-esophageal reflux disease without esophagitis: Secondary | ICD-10-CM | POA: Diagnosis not present

## 2023-09-28 DIAGNOSIS — N186 End stage renal disease: Secondary | ICD-10-CM | POA: Diagnosis not present

## 2023-09-28 DIAGNOSIS — I120 Hypertensive chronic kidney disease with stage 5 chronic kidney disease or end stage renal disease: Secondary | ICD-10-CM | POA: Diagnosis not present

## 2023-09-29 ENCOUNTER — Other Ambulatory Visit: Payer: Self-pay | Admitting: Primary Care

## 2023-09-29 ENCOUNTER — Encounter: Payer: Self-pay | Admitting: Surgery

## 2023-09-29 ENCOUNTER — Other Ambulatory Visit: Payer: Self-pay | Admitting: *Deleted

## 2023-09-29 DIAGNOSIS — N2581 Secondary hyperparathyroidism of renal origin: Secondary | ICD-10-CM | POA: Diagnosis not present

## 2023-09-29 DIAGNOSIS — N186 End stage renal disease: Secondary | ICD-10-CM | POA: Diagnosis not present

## 2023-09-29 DIAGNOSIS — Z95828 Presence of other vascular implants and grafts: Secondary | ICD-10-CM

## 2023-09-29 DIAGNOSIS — Z992 Dependence on renal dialysis: Secondary | ICD-10-CM | POA: Diagnosis not present

## 2023-09-29 DIAGNOSIS — E1169 Type 2 diabetes mellitus with other specified complication: Secondary | ICD-10-CM

## 2023-09-29 DIAGNOSIS — J4530 Mild persistent asthma, uncomplicated: Secondary | ICD-10-CM

## 2023-10-02 DIAGNOSIS — N186 End stage renal disease: Secondary | ICD-10-CM | POA: Diagnosis not present

## 2023-10-02 DIAGNOSIS — Z992 Dependence on renal dialysis: Secondary | ICD-10-CM | POA: Diagnosis not present

## 2023-10-02 DIAGNOSIS — N2581 Secondary hyperparathyroidism of renal origin: Secondary | ICD-10-CM | POA: Diagnosis not present

## 2023-10-03 ENCOUNTER — Ambulatory Visit (HOSPITAL_COMMUNITY)
Admission: RE | Admit: 2023-10-03 | Discharge: 2023-10-03 | Disposition: A | Discharge status: Home / Self Care | Payer: Medicare HMO | Source: Ambulatory Visit | Attending: Vascular Surgery | Admitting: Vascular Surgery

## 2023-10-03 DIAGNOSIS — Z95828 Presence of other vascular implants and grafts: Secondary | ICD-10-CM | POA: Insufficient documentation

## 2023-10-03 LAB — VAS US ABI WITH/WO TBI
Left ABI: 1.15
Right ABI: 1.2

## 2023-10-04 DIAGNOSIS — Z992 Dependence on renal dialysis: Secondary | ICD-10-CM | POA: Diagnosis not present

## 2023-10-04 DIAGNOSIS — N2581 Secondary hyperparathyroidism of renal origin: Secondary | ICD-10-CM | POA: Diagnosis not present

## 2023-10-04 DIAGNOSIS — N186 End stage renal disease: Secondary | ICD-10-CM | POA: Diagnosis not present

## 2023-10-05 DIAGNOSIS — Z48812 Encounter for surgical aftercare following surgery on the circulatory system: Secondary | ICD-10-CM | POA: Diagnosis not present

## 2023-10-05 DIAGNOSIS — G4730 Sleep apnea, unspecified: Secondary | ICD-10-CM | POA: Diagnosis not present

## 2023-10-05 DIAGNOSIS — M109 Gout, unspecified: Secondary | ICD-10-CM | POA: Diagnosis not present

## 2023-10-05 DIAGNOSIS — I120 Hypertensive chronic kidney disease with stage 5 chronic kidney disease or end stage renal disease: Secondary | ICD-10-CM | POA: Diagnosis not present

## 2023-10-05 DIAGNOSIS — E1122 Type 2 diabetes mellitus with diabetic chronic kidney disease: Secondary | ICD-10-CM | POA: Diagnosis not present

## 2023-10-05 DIAGNOSIS — Z992 Dependence on renal dialysis: Secondary | ICD-10-CM | POA: Diagnosis not present

## 2023-10-05 DIAGNOSIS — D631 Anemia in chronic kidney disease: Secondary | ICD-10-CM | POA: Diagnosis not present

## 2023-10-05 DIAGNOSIS — K219 Gastro-esophageal reflux disease without esophagitis: Secondary | ICD-10-CM | POA: Diagnosis not present

## 2023-10-05 DIAGNOSIS — N186 End stage renal disease: Secondary | ICD-10-CM | POA: Diagnosis not present

## 2023-10-06 DIAGNOSIS — N186 End stage renal disease: Secondary | ICD-10-CM | POA: Diagnosis not present

## 2023-10-06 DIAGNOSIS — N2581 Secondary hyperparathyroidism of renal origin: Secondary | ICD-10-CM | POA: Diagnosis not present

## 2023-10-06 DIAGNOSIS — Z992 Dependence on renal dialysis: Secondary | ICD-10-CM | POA: Diagnosis not present

## 2023-10-08 DIAGNOSIS — Z992 Dependence on renal dialysis: Secondary | ICD-10-CM | POA: Diagnosis not present

## 2023-10-08 DIAGNOSIS — N2581 Secondary hyperparathyroidism of renal origin: Secondary | ICD-10-CM | POA: Diagnosis not present

## 2023-10-08 DIAGNOSIS — N186 End stage renal disease: Secondary | ICD-10-CM | POA: Diagnosis not present

## 2023-10-09 DIAGNOSIS — N186 End stage renal disease: Secondary | ICD-10-CM | POA: Diagnosis not present

## 2023-10-09 DIAGNOSIS — N2581 Secondary hyperparathyroidism of renal origin: Secondary | ICD-10-CM | POA: Diagnosis not present

## 2023-10-09 DIAGNOSIS — Z992 Dependence on renal dialysis: Secondary | ICD-10-CM | POA: Diagnosis not present

## 2023-10-12 DIAGNOSIS — N186 End stage renal disease: Secondary | ICD-10-CM | POA: Diagnosis not present

## 2023-10-12 DIAGNOSIS — N2581 Secondary hyperparathyroidism of renal origin: Secondary | ICD-10-CM | POA: Diagnosis not present

## 2023-10-12 DIAGNOSIS — Z992 Dependence on renal dialysis: Secondary | ICD-10-CM | POA: Diagnosis not present

## 2023-10-15 DIAGNOSIS — N2581 Secondary hyperparathyroidism of renal origin: Secondary | ICD-10-CM | POA: Diagnosis not present

## 2023-10-15 DIAGNOSIS — Z992 Dependence on renal dialysis: Secondary | ICD-10-CM | POA: Diagnosis not present

## 2023-10-15 DIAGNOSIS — N186 End stage renal disease: Secondary | ICD-10-CM | POA: Diagnosis not present

## 2023-10-16 DIAGNOSIS — Z992 Dependence on renal dialysis: Secondary | ICD-10-CM | POA: Diagnosis not present

## 2023-10-16 DIAGNOSIS — E1122 Type 2 diabetes mellitus with diabetic chronic kidney disease: Secondary | ICD-10-CM | POA: Diagnosis not present

## 2023-10-16 DIAGNOSIS — I120 Hypertensive chronic kidney disease with stage 5 chronic kidney disease or end stage renal disease: Secondary | ICD-10-CM | POA: Diagnosis not present

## 2023-10-16 DIAGNOSIS — Z48812 Encounter for surgical aftercare following surgery on the circulatory system: Secondary | ICD-10-CM | POA: Diagnosis not present

## 2023-10-16 DIAGNOSIS — K219 Gastro-esophageal reflux disease without esophagitis: Secondary | ICD-10-CM | POA: Diagnosis not present

## 2023-10-16 DIAGNOSIS — M109 Gout, unspecified: Secondary | ICD-10-CM | POA: Diagnosis not present

## 2023-10-16 DIAGNOSIS — G4730 Sleep apnea, unspecified: Secondary | ICD-10-CM | POA: Diagnosis not present

## 2023-10-16 DIAGNOSIS — D631 Anemia in chronic kidney disease: Secondary | ICD-10-CM | POA: Diagnosis not present

## 2023-10-16 DIAGNOSIS — N186 End stage renal disease: Secondary | ICD-10-CM | POA: Diagnosis not present

## 2023-10-17 DIAGNOSIS — N2581 Secondary hyperparathyroidism of renal origin: Secondary | ICD-10-CM | POA: Diagnosis not present

## 2023-10-17 DIAGNOSIS — N186 End stage renal disease: Secondary | ICD-10-CM | POA: Diagnosis not present

## 2023-10-17 DIAGNOSIS — Z992 Dependence on renal dialysis: Secondary | ICD-10-CM | POA: Diagnosis not present

## 2023-10-17 DIAGNOSIS — E1122 Type 2 diabetes mellitus with diabetic chronic kidney disease: Secondary | ICD-10-CM | POA: Diagnosis not present

## 2023-10-18 NOTE — Progress Notes (Signed)
 Office note   History of Present Illness: [name] is a 58 y.o. female presenting status post arch to branching with myself and Dr. 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  Postop course complicated by left upper extremity DVT for which Eliquis  was started.  On exam today, [name] was doing well.  She feels like she is getting her strength back.  She continues to have some pain at the sternal incision, but is otherwise without pain.  Denies claudication, ischemic rest pain, tissue loss.  Denies TIA, amaurosis, stroke.  denies weakness or sensation deficits in the extremities.  Continues to receive dialysis from a right sided tunneled dialysis catheter.   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  Genes Analyzed (43 total): APC*, ATM*, AXIN2, BARD1, BMPR1A, BRCA1*, BRCA2*, BRIP1*, CDH1*, CDK4, CDKN2A, CHEK2*, DICER1, GALNT12, HOXB13, MEN1, MLH1*, MRE11A, MSH2*, MSH3, MSH6*, MUTYH*, NBN, NF1*, NTHL1, PALB2*, PMS2*, POLD1, POLE, PTEN*, RAD50, RAD51C*, RAD51D*, RET, SDHB, SDHD, SMAD4, SMARCA4, STK11 and TP53* (sequencing and deletion/duplication); EGFR (s   GERD  07/24/2006   GRAVE'S DISEASE 01/01/2008   History of hidradenitis suppurativa    History of kidney stones    History of thrush    HIV DISEASE 07/24/2006   dx March 05   Hyperlipidemia    HYPERTENSION 07/24/2006   Hyperthyroidism 08/2006   Grave's Disease -diffuse radiotracer uptake 08/25/06 Thyroid  scan-Cold nodule to R lower lobe of thyrorid   Ileus (HCC) 02/14/2023   Menometrorrhagia    hx of   Nephrolithiasis    Panniculitis 05/12/2020   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 Completed 2 yrs Prednisone  after lung bx confirmation. No symptoms since then.   Sebaceous cyst 04/21/2020   Suppurative hidradenitis    Thyroid  cancer (HCC)    THYROID  NODULE, RIGHT 02/08/2007    Past Surgical History:  Procedure Laterality Date   AORTIC ARCH DEBRANCHING N/A 08/25/2023   Procedure: AORTIC ARCH DEBRANCHING USING 12X8X8MM HEMASHIELD GRAFT;  Surgeon: Lucas Dorise POUR, MD;  Location: [facility];  Service: Open Heart Surgery;  Laterality: N/A;  with bypasses to the innominate  and left common carotid arteries   APPLICATION OF WOUND VAC N/A 01/20/2021   Procedure: APPLICATION OF WOUND VAC;  Surgeon: Lowery Estefana RAMAN, DO;  Location: [facility];  Service: Plastics;  Laterality: N/A;   BREAST EXCISIONAL BIOPSY Right 04/26/2018   right axilla negative   BREAST EXCISIONAL BIOPSY Left 04/26/2018   left axilla negative   BREAST LUMPECTOMY Right 10/03/2018   malignant    BREAST LUMPECTOMY WITH RADIOACTIVE SEED AND SENTINEL LYMPH NODE BIOPSY Right 10/03/2018   Procedure: RIGHT BREAST LUMPECTOMY WITH RADIOACTIVE SEED AND SENTINEL LYMPH NODE MAPPING;  Surgeon: Vanderbilt Ned, MD;  Location: [facility];  Service: General;  Laterality: Right;   BREAST SURGERY  1997   Breast Reduction    CYSTOSCOPY W/ URETERAL STENT REMOVAL  11/09/2012   Procedure: CYSTOSCOPY WITH STENT REMOVAL;  Surgeon: Ricardo Likens, MD;  Location: [facility];  Service: Urology;  Laterality: Right;   CYSTOSCOPY WITH RETROGRADE PYELOGRAM, URETEROSCOPY AND STENT PLACEMENT  11/09/2012   Procedure: CYSTOSCOPY WITH RETROGRADE PYELOGRAM, URETEROSCOPY AND STENT PLACEMENT;  Surgeon: Ricardo Likens, MD;  Location: [facility];  Service: Urology;  Laterality: Left;  LEFT URETEROSCOPY, STONE MANIPULATION, left STENT exchange    CYSTOSCOPY WITH STENT PLACEMENT  10/02/2012   Procedure: CYSTOSCOPY WITH STENT PLACEMENT;  Surgeon: Ricardo Likens, MD;  Location: [facility];  Service: Urology;  Laterality: Left;   DEBRIDEMENT AND CLOSURE WOUND N/A 01/20/2021   Procedure: Excision of abdominal wound with closure;  Surgeon: Lowery Estefana RAMAN, DO;  Location: [facility];  Service: Plastics;  Laterality: N/A;   DILATION AND CURETTAGE OF UTERUS  11/2002   s/p for 1st trimester nonviable pregnancy   EYE SURGERY     sty under eyelid   INCISE AND DRAIN ABCESS  08/2002   s/p I &D for righ inframmary fold hidradenitis   INCISION AND DRAINAGE PERITONSILLAR ABSCESS  12/2001   IR CV LINE INJECTION  06/07/2018   IR FLUORO GUIDE CV LINE RIGHT  01/30/2023   IR IMAGING GUIDED PORT INSERTION  06/20/2018   IR REMOVAL TUN ACCESS W/ PORT W/O FL MOD SED  06/20/2018   IR US  GUIDE VASC ACCESS RIGHT  01/30/2023   IRRIGATION AND DEBRIDEMENT ABSCESS  01/31/2012   Procedure: IRRIGATION AND DEBRIDEMENT ABSCESS;  Surgeon: Alm VEAR Angle, MD;  Location: [facility];  Service: General;  Laterality: Right;  right breast and axilla     NEPHROLITHOTOMY  10/02/2012   Procedure: NEPHROLITHOTOMY PERCUTANEOUS;  Surgeon: Ricardo Likens, MD;  Location: [facility];  Service: Urology;  Laterality: Right;  First Stage Percutaneous Nephrolithotomy with Surgeon Access, Left Ureteral Stent     NEPHROLITHOTOMY  10/04/2012   Procedure: NEPHROLITHOTOMY PERCUTANEOUS SECOND LOOK;  Surgeon: Ricardo Likens, MD;  [facility]: [facility];  Service: Urology;  Laterality: Right;      NEPHROLITHOTOMY  10/08/2012   Procedure: NEPHROLITHOTOMY PERCUTANEOUS;  Surgeon: Ricardo Likens, MD;  Location: [facility];  Service: Urology;  Laterality: Right;  THIRD STAGE, nephrostomy tube exchange x 2   NEPHROLITHOTOMY  10/11/2012   Procedure: NEPHROLITHOTOMY PERCUTANEOUS SECOND LOOK;  Surgeon: Ricardo Likens, MD;  Location: [facility];  Service: Urology;  Laterality: Right;  RIGHT 4 STAGE PERCUTANOUS NEPHROLITHOTOMY, right URETEROSCOPY WITH HOLMIUM LASER    PANNICULECTOMY N/A 12/21/2020   Procedure: PANNICULECTOMY;  Surgeon: Lowery Estefana RAMAN, DO;  Location: [facility];  Service: Plastics;  Laterality: N/A;   PERCUTANEOUS NEPHROSTOLITHOTOMY  04/2022   PORT-A-CATH REMOVAL N/A 07/16/2020   Procedure: REMOVAL PORT-A-CATH;  Surgeon: Vanderbilt Ned, MD;  Location: [facility];  Service: General;  Laterality: N/A;   PORTACATH PLACEMENT Left 05/17/2018   Procedure: INSERTION PORT-A-CATH;  Surgeon: Vernetta Berg, MD;  Location: [facility];  Service: General;  Laterality: Left;   RADICAL NECK DISSECTION  04/12/2017   limited/notes 04/12/2017   RADICAL NECK DISSECTION N/A 04/12/2017   Procedure: RADICAL NECK DISSECTION;  Surgeon: Carlie Clark, MD;  Location: [facility] OR;  Service: ENT;  Laterality: N/A;  limited neck dissection 2 hours total   REDUCTION MAMMAPLASTY Bilateral 1998   RIGHT/LEFT HEART CATH AND CORONARY ANGIOGRAPHY N/A 03/12/2020   Procedure: RIGHT/LEFT HEART CATH AND CORONARY ANGIOGRAPHY;  Surgeon: Cherrie Toribio SAUNDERS, MD;  [facility];  Service: Cardiovascular;  Laterality: N/A;   [place]  1994   TEE WITHOUT CARDIOVERSION N/A 08/25/2023   Procedure: TRANSESOPHAGEAL ECHOCARDIOGRAM;  Surgeon: Lucas Dorise POUR, MD;  Location: [facility];  Service: Open Heart Surgery;  Laterality: N/A;   THORACIC AORTIC ENDOVASCULAR STENT GRAFT N/A 02/02/2023   Procedure: THORACIC AORTIC ENDOVASCULAR STENT GRAFT;  Surgeon: Lanis Fonda BRAVO, MD;  Location: [facility] OR;  Service: Vascular;  Laterality: N/A;   THORACIC AORTIC ENDOVASCULAR STENT GRAFT N/A 08/25/2023   Procedure: THORACIC AORTIC ENDOVASCULAR STENT GRAFT;  Surgeon: Lanis Fonda BRAVO, MD;  Location: [facility] OR;  Service: Vascular;  Laterality: N/A;   THYROIDECTOMY  04/12/2017   completion/notes 04/12/2017   THYROIDECTOMY N/A 04/12/2017   Procedure: THYROIDECTOMY;  Surgeon: Carlie Clark, MD;  Location: [facility];  Service: ENT;  Laterality: N/A;  Completion Thyroidectomy   TOTAL THYROIDECTOMY  2010   ULTRASOUND GUIDANCE FOR VASCULAR ACCESS Right 08/25/2023   Procedure: ULTRASOUND GUIDANCE FOR VASCULAR ACCESS, RIGHT FEMORAL ARTERY;  Surgeon: Lanis Fonda BRAVO, MD;  Location: [facility] OR;  Service: Vascular;  Laterality: Right;    No outpatient medications have been marked as taking for the 10/19/23 encounter (Appointment) with Lanis Fonda BRAVO, MD.    12 system ROS was negative unless otherwise noted in HPI  Physical Exam Constitutional:      Appearance: Normal appearance. She is obese.  HENT:     Head: Normocephalic.     Mouth/Throat:     Pharynx: Oropharynx is clear.  Eyes:     Extraocular Movements: Extraocular movements intact.     Pupils: Pupils are equal, round, and reactive to light.  Cardiovascular:     Rate and Rhythm: Normal rate and regular rhythm.     Pulses: Normal pulses.          Dorsalis pedis pulses are 2+ on the right side and 2+ on the left side.  Pulmonary:     Effort: Pulmonary effort is normal.  Abdominal:     General: There is no distension.     Palpations: Abdomen is soft.      Tenderness: There is no abdominal tenderness.  Musculoskeletal:        General: No swelling.  Skin:    General: Skin is warm and dry.     Coloration: Skin is not jaundiced.  Neurological:     General: No focal deficit present.     Mental Status: She is alert and oriented to person, place, and time.       Observations/Objective: See scan  Assessment and Plan:  [name] is presenting status post arch debranching with myself and Dr. 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  Postop course complicated by left upper extremity DVT for which  Eliquis  was started.  Arch debranching 08/29/23: Overall doing well, asymptomatic other than some pain at the sternotomy site.  CT angio reviewed demonstrating exclusion of the native aorta.  There appears to be a small amount of filling of the native innominate trunk.  The pseudoaneurysm on the inferior aspect of the aorta is excluded.  Zone 0 bypass widely patent.  Distally, the false lumen in the thoracic aorta no longer fills.  There has been no change in size.  The infrarenal abdominal aorta continues to have a dissection which continues into the left common femoral artery.  There is stenosis of the left iliac system, however she remains palpable in the feet with no symptoms of claudication.  I have no plans to intervene on this unless she becomes symptomatic. Plan will be repeat CT angiogram chest abdomen pelvis in 6 months.  Left upper extremity DVT: Patient's left arm has improved dramatically.  Now symmetric to the right.  Plan will be to continue Eliquis  until February 8.  She will receive for repeat ultrasound at that time.  She is aware that with the new graft placed in the chest, she may have extrinsic compression of the left brachiocephalic vein.  If she has another DVT occur, she will be on lifelong anticoagulation.  Incisional disease: Had a long conversation with [facility] that we  need to work on permanent HD access.  She is interested in peritoneal dialysis, and is going through the steps.  Should she be deemed a good candidate by nephrology, I will have her see one of my partners replaces PD catheters.  I would like to schedule this when I see her again in the office in February.   Signed, Fonda FORBES [facility] Office: [phone]  10/18/2023, 1:33 PM

## 2023-10-19 ENCOUNTER — Ambulatory Visit: Payer: Medicare HMO | Admitting: Vascular Surgery

## 2023-10-19 ENCOUNTER — Encounter: Payer: Self-pay | Admitting: Vascular Surgery

## 2023-10-19 VITALS — BP 147/89 | Temp 98.4°F | Resp 20 | Ht 65.0 in | Wt 179.0 lb

## 2023-10-19 DIAGNOSIS — N186 End stage renal disease: Secondary | ICD-10-CM

## 2023-10-19 DIAGNOSIS — I82622 Acute embolism and thrombosis of deep veins of left upper extremity: Secondary | ICD-10-CM

## 2023-10-19 DIAGNOSIS — Z992 Dependence on renal dialysis: Secondary | ICD-10-CM

## 2023-10-19 DIAGNOSIS — Z95828 Presence of other vascular implants and grafts: Secondary | ICD-10-CM

## 2023-10-20 DIAGNOSIS — N2581 Secondary hyperparathyroidism of renal origin: Secondary | ICD-10-CM | POA: Diagnosis not present

## 2023-10-20 DIAGNOSIS — N186 End stage renal disease: Secondary | ICD-10-CM | POA: Diagnosis not present

## 2023-10-20 DIAGNOSIS — Z992 Dependence on renal dialysis: Secondary | ICD-10-CM | POA: Diagnosis not present

## 2023-10-23 DIAGNOSIS — N2581 Secondary hyperparathyroidism of renal origin: Secondary | ICD-10-CM | POA: Diagnosis not present

## 2023-10-23 DIAGNOSIS — N186 End stage renal disease: Secondary | ICD-10-CM | POA: Diagnosis not present

## 2023-10-23 DIAGNOSIS — Z992 Dependence on renal dialysis: Secondary | ICD-10-CM | POA: Diagnosis not present

## 2023-10-25 ENCOUNTER — Telehealth: Payer: Self-pay

## 2023-10-25 DIAGNOSIS — Z992 Dependence on renal dialysis: Secondary | ICD-10-CM | POA: Diagnosis not present

## 2023-10-25 DIAGNOSIS — N186 End stage renal disease: Secondary | ICD-10-CM | POA: Diagnosis not present

## 2023-10-25 DIAGNOSIS — N2581 Secondary hyperparathyroidism of renal origin: Secondary | ICD-10-CM | POA: Diagnosis not present

## 2023-10-25 NOTE — Telephone Encounter (Signed)
 Copied from CRM [id number]. Topic: Clinical - Home Health Verbal Orders >> Oct 25, 2023 12:48 PM Renea ORN wrote: Caller/Agency: [facility] Callback Number:  Service Requested:  Frequency:  Any new concerns about the patient? Please sign and return addendum

## 2023-10-25 NOTE — Telephone Encounter (Signed)
Approve home health orders.

## 2023-10-26 DIAGNOSIS — N186 End stage renal disease: Secondary | ICD-10-CM | POA: Diagnosis not present

## 2023-10-26 DIAGNOSIS — G4730 Sleep apnea, unspecified: Secondary | ICD-10-CM | POA: Diagnosis not present

## 2023-10-26 DIAGNOSIS — M109 Gout, unspecified: Secondary | ICD-10-CM | POA: Diagnosis not present

## 2023-10-26 DIAGNOSIS — D631 Anemia in chronic kidney disease: Secondary | ICD-10-CM | POA: Diagnosis not present

## 2023-10-26 DIAGNOSIS — Z992 Dependence on renal dialysis: Secondary | ICD-10-CM | POA: Diagnosis not present

## 2023-10-26 DIAGNOSIS — K219 Gastro-esophageal reflux disease without esophagitis: Secondary | ICD-10-CM | POA: Diagnosis not present

## 2023-10-26 DIAGNOSIS — E1122 Type 2 diabetes mellitus with diabetic chronic kidney disease: Secondary | ICD-10-CM | POA: Diagnosis not present

## 2023-10-26 DIAGNOSIS — I120 Hypertensive chronic kidney disease with stage 5 chronic kidney disease or end stage renal disease: Secondary | ICD-10-CM | POA: Diagnosis not present

## 2023-10-26 DIAGNOSIS — Z48812 Encounter for surgical aftercare following surgery on the circulatory system: Secondary | ICD-10-CM | POA: Diagnosis not present

## 2023-10-26 NOTE — Telephone Encounter (Signed)
 Don't have number to call to give orders. Will look in chart at later time. Have sent CRM to pool with error

## 2023-10-26 NOTE — Telephone Encounter (Signed)
 Called and advised [name] with [name]  of the approval of the requested verbal orders for this patient. Advised to call back with any further questions.

## 2023-10-26 NOTE — Telephone Encounter (Signed)
 Per scanned media from 09/2023, [place] phone number is [phone] and fax is [phone]      Copied from CRM [id number]. Topic: Clinical - Home Health Verbal Orders >> Oct 26, 2023 11:09 AM CMA Joellen T wrote: No phone number to call back

## 2023-10-27 DIAGNOSIS — Z992 Dependence on renal dialysis: Secondary | ICD-10-CM | POA: Diagnosis not present

## 2023-10-27 DIAGNOSIS — N186 End stage renal disease: Secondary | ICD-10-CM | POA: Diagnosis not present

## 2023-10-27 DIAGNOSIS — N2581 Secondary hyperparathyroidism of renal origin: Secondary | ICD-10-CM | POA: Diagnosis not present

## 2023-10-30 DIAGNOSIS — Z992 Dependence on renal dialysis: Secondary | ICD-10-CM | POA: Diagnosis not present

## 2023-10-30 DIAGNOSIS — N2581 Secondary hyperparathyroidism of renal origin: Secondary | ICD-10-CM | POA: Diagnosis not present

## 2023-10-30 DIAGNOSIS — N186 End stage renal disease: Secondary | ICD-10-CM | POA: Diagnosis not present

## 2023-10-31 ENCOUNTER — Other Ambulatory Visit: Payer: Self-pay

## 2023-10-31 DIAGNOSIS — G4730 Sleep apnea, unspecified: Secondary | ICD-10-CM | POA: Diagnosis not present

## 2023-10-31 DIAGNOSIS — I82622 Acute embolism and thrombosis of deep veins of left upper extremity: Secondary | ICD-10-CM

## 2023-10-31 DIAGNOSIS — N186 End stage renal disease: Secondary | ICD-10-CM | POA: Diagnosis not present

## 2023-10-31 DIAGNOSIS — E1122 Type 2 diabetes mellitus with diabetic chronic kidney disease: Secondary | ICD-10-CM | POA: Diagnosis not present

## 2023-10-31 DIAGNOSIS — K219 Gastro-esophageal reflux disease without esophagitis: Secondary | ICD-10-CM | POA: Diagnosis not present

## 2023-10-31 DIAGNOSIS — M109 Gout, unspecified: Secondary | ICD-10-CM | POA: Diagnosis not present

## 2023-10-31 DIAGNOSIS — I120 Hypertensive chronic kidney disease with stage 5 chronic kidney disease or end stage renal disease: Secondary | ICD-10-CM | POA: Diagnosis not present

## 2023-10-31 DIAGNOSIS — Z992 Dependence on renal dialysis: Secondary | ICD-10-CM | POA: Diagnosis not present

## 2023-10-31 DIAGNOSIS — Z48812 Encounter for surgical aftercare following surgery on the circulatory system: Secondary | ICD-10-CM | POA: Diagnosis not present

## 2023-10-31 DIAGNOSIS — D631 Anemia in chronic kidney disease: Secondary | ICD-10-CM | POA: Diagnosis not present

## 2023-10-31 DIAGNOSIS — I7103 Dissection of thoracoabdominal aorta: Secondary | ICD-10-CM

## 2023-11-01 DIAGNOSIS — N2581 Secondary hyperparathyroidism of renal origin: Secondary | ICD-10-CM | POA: Diagnosis not present

## 2023-11-01 DIAGNOSIS — N186 End stage renal disease: Secondary | ICD-10-CM | POA: Diagnosis not present

## 2023-11-01 DIAGNOSIS — Z992 Dependence on renal dialysis: Secondary | ICD-10-CM | POA: Diagnosis not present

## 2023-11-03 DIAGNOSIS — N186 End stage renal disease: Secondary | ICD-10-CM | POA: Diagnosis not present

## 2023-11-03 DIAGNOSIS — N2581 Secondary hyperparathyroidism of renal origin: Secondary | ICD-10-CM | POA: Diagnosis not present

## 2023-11-03 DIAGNOSIS — Z992 Dependence on renal dialysis: Secondary | ICD-10-CM | POA: Diagnosis not present

## 2023-11-06 DIAGNOSIS — N186 End stage renal disease: Secondary | ICD-10-CM | POA: Diagnosis not present

## 2023-11-06 DIAGNOSIS — Z992 Dependence on renal dialysis: Secondary | ICD-10-CM | POA: Diagnosis not present

## 2023-11-06 DIAGNOSIS — N2581 Secondary hyperparathyroidism of renal origin: Secondary | ICD-10-CM | POA: Diagnosis not present

## 2023-11-08 DIAGNOSIS — Z992 Dependence on renal dialysis: Secondary | ICD-10-CM | POA: Diagnosis not present

## 2023-11-08 DIAGNOSIS — N2581 Secondary hyperparathyroidism of renal origin: Secondary | ICD-10-CM | POA: Diagnosis not present

## 2023-11-08 DIAGNOSIS — N186 End stage renal disease: Secondary | ICD-10-CM | POA: Diagnosis not present

## 2023-11-10 DIAGNOSIS — Z992 Dependence on renal dialysis: Secondary | ICD-10-CM | POA: Diagnosis not present

## 2023-11-10 DIAGNOSIS — N186 End stage renal disease: Secondary | ICD-10-CM | POA: Diagnosis not present

## 2023-11-10 DIAGNOSIS — N2581 Secondary hyperparathyroidism of renal origin: Secondary | ICD-10-CM | POA: Diagnosis not present

## 2023-11-11 ENCOUNTER — Other Ambulatory Visit: Payer: Self-pay | Admitting: Primary Care

## 2023-11-11 DIAGNOSIS — M1A09X0 Idiopathic chronic gout, multiple sites, without tophus (tophi): Secondary | ICD-10-CM

## 2023-11-13 DIAGNOSIS — N186 End stage renal disease: Secondary | ICD-10-CM | POA: Diagnosis not present

## 2023-11-13 DIAGNOSIS — Z992 Dependence on renal dialysis: Secondary | ICD-10-CM | POA: Diagnosis not present

## 2023-11-13 DIAGNOSIS — N2581 Secondary hyperparathyroidism of renal origin: Secondary | ICD-10-CM | POA: Diagnosis not present

## 2023-11-15 ENCOUNTER — Telehealth: Payer: Self-pay

## 2023-11-15 ENCOUNTER — Other Ambulatory Visit (HOSPITAL_COMMUNITY): Payer: Self-pay

## 2023-11-15 ENCOUNTER — Other Ambulatory Visit: Payer: Self-pay | Admitting: Primary Care

## 2023-11-15 DIAGNOSIS — Z992 Dependence on renal dialysis: Secondary | ICD-10-CM | POA: Diagnosis not present

## 2023-11-15 DIAGNOSIS — N186 End stage renal disease: Secondary | ICD-10-CM | POA: Diagnosis not present

## 2023-11-15 DIAGNOSIS — N2581 Secondary hyperparathyroidism of renal origin: Secondary | ICD-10-CM | POA: Diagnosis not present

## 2023-11-15 DIAGNOSIS — J069 Acute upper respiratory infection, unspecified: Secondary | ICD-10-CM

## 2023-11-15 NOTE — Telephone Encounter (Signed)
Pharmacy Patient Advocate Encounter   Received notification from CoverMyMeds that prior authorization for DESCOVY is required/requested.   Insurance verification completed.   The patient is insured through [employer] .   Per test claim: PA required; PA submitted to above mentioned insurance via CoverMyMeds Key/confirmation #/EOC [id number] Status is pending

## 2023-11-15 NOTE — Telephone Encounter (Signed)
Pharmacy Patient Advocate Encounter  Received notification from [facility] that Prior Authorization for DESCOVY has been CANCELLED due to being available without authorization    PA #/Case ID/Reference #: [id number]

## 2023-11-17 DIAGNOSIS — N186 End stage renal disease: Secondary | ICD-10-CM | POA: Diagnosis not present

## 2023-11-17 DIAGNOSIS — N2581 Secondary hyperparathyroidism of renal origin: Secondary | ICD-10-CM | POA: Diagnosis not present

## 2023-11-17 DIAGNOSIS — Z992 Dependence on renal dialysis: Secondary | ICD-10-CM | POA: Diagnosis not present

## 2023-11-17 DIAGNOSIS — E1122 Type 2 diabetes mellitus with diabetic chronic kidney disease: Secondary | ICD-10-CM | POA: Diagnosis not present

## 2023-11-20 DIAGNOSIS — N186 End stage renal disease: Secondary | ICD-10-CM | POA: Diagnosis not present

## 2023-11-20 DIAGNOSIS — N2581 Secondary hyperparathyroidism of renal origin: Secondary | ICD-10-CM | POA: Diagnosis not present

## 2023-11-20 DIAGNOSIS — Z992 Dependence on renal dialysis: Secondary | ICD-10-CM | POA: Diagnosis not present

## 2023-11-22 DIAGNOSIS — Z992 Dependence on renal dialysis: Secondary | ICD-10-CM | POA: Diagnosis not present

## 2023-11-22 DIAGNOSIS — N186 End stage renal disease: Secondary | ICD-10-CM | POA: Diagnosis not present

## 2023-11-22 DIAGNOSIS — N2581 Secondary hyperparathyroidism of renal origin: Secondary | ICD-10-CM | POA: Diagnosis not present

## 2023-11-24 ENCOUNTER — Ambulatory Visit: Payer: Self-pay | Admitting: Primary Care

## 2023-11-24 DIAGNOSIS — Z992 Dependence on renal dialysis: Secondary | ICD-10-CM | POA: Diagnosis not present

## 2023-11-24 DIAGNOSIS — N2581 Secondary hyperparathyroidism of renal origin: Secondary | ICD-10-CM | POA: Diagnosis not present

## 2023-11-24 DIAGNOSIS — N898 Other specified noninflammatory disorders of vagina: Secondary | ICD-10-CM

## 2023-11-24 DIAGNOSIS — N186 End stage renal disease: Secondary | ICD-10-CM | POA: Diagnosis not present

## 2023-11-24 MED ORDER — FLUCONAZOLE 150 MG PO TABS: 150.0000 mg | ORAL_TABLET | Freq: Once | ORAL | 0 refills | Status: AC

## 2023-11-24 NOTE — Telephone Encounter (Addendum)
 This RN third attempt to contact patient for triage. No answer, voicemail left requesting return call to clinic. Routing to clinic for follow up.  This RN second attempt to contact patient for triage. No answer, voicemail left requesting return call to clinic.   This RN attempted to contact patient for triage. No answer, voicemail left requesting return call to clinic.   Copied from CRM [id number]. Topic: Clinical - Medical Advice >> Nov 24, 2023  9:02 AM Graeme ORN wrote: Reason for CRM: Patient called states she would like provider to call in medication for yeast infection due to taking antibiotic to [id number] - [place], [place] - [address]. States she has been treated for this in the past. Thank You

## 2023-11-24 NOTE — Telephone Encounter (Signed)
 Please notify patient I will send a prescription for fluconazole  to her pharmacy for the presumed antibiotic induced yeast infection.  If no improvement then we will need office visit.  I would like to see her for general follow-up within the next 1 to 2 months.  Can we get her scheduled for the end of either the morning or afternoon session?

## 2023-11-24 NOTE — Telephone Encounter (Signed)
 Called patient and reviewed all information. Patient verbalized understanding.  Scheduled f/u in March 2025 Will call if any further questions.

## 2023-11-24 NOTE — Telephone Encounter (Signed)
 Called and spoke with patient she states she takes abx minocycline  (DYNACIN ) 100 MG tablet daily prescribed by infectious disease provider, she recently missed a couple doses due to running out and now she states she has a yeast infection. She is having vaginal itching and irritation. She says this has happened before when she has missed a couple doses of the abx. Offered patient appt at [facility], patient states she cannot due to having dialysis Mon, wed, fri. Pt is requesting medication be sent in. Please advise

## 2023-11-24 NOTE — Telephone Encounter (Signed)
 Reason for Disposition . Third attempt to contact caller AND no contact made. Phone number verified.  Answer Assessment - Initial Assessment Questions N/A This RN three attempts to contact patient for triage unsuccessful, forwarding to clinic for follow up.  Protocols used: No Contact or Duplicate Contact Call-A-AH

## 2023-11-27 DIAGNOSIS — N186 End stage renal disease: Secondary | ICD-10-CM | POA: Diagnosis not present

## 2023-11-27 DIAGNOSIS — N2581 Secondary hyperparathyroidism of renal origin: Secondary | ICD-10-CM | POA: Diagnosis not present

## 2023-11-27 DIAGNOSIS — Z992 Dependence on renal dialysis: Secondary | ICD-10-CM | POA: Diagnosis not present

## 2023-11-29 ENCOUNTER — Other Ambulatory Visit: Payer: Self-pay | Admitting: Physician Assistant

## 2023-11-29 DIAGNOSIS — N186 End stage renal disease: Secondary | ICD-10-CM | POA: Diagnosis not present

## 2023-11-29 DIAGNOSIS — Z992 Dependence on renal dialysis: Secondary | ICD-10-CM | POA: Diagnosis not present

## 2023-11-29 DIAGNOSIS — N2581 Secondary hyperparathyroidism of renal origin: Secondary | ICD-10-CM | POA: Diagnosis not present

## 2023-11-30 ENCOUNTER — Ambulatory Visit: Payer: Medicare HMO | Admitting: Podiatry

## 2023-12-01 DIAGNOSIS — N186 End stage renal disease: Secondary | ICD-10-CM | POA: Diagnosis not present

## 2023-12-01 DIAGNOSIS — N2581 Secondary hyperparathyroidism of renal origin: Secondary | ICD-10-CM | POA: Diagnosis not present

## 2023-12-01 DIAGNOSIS — Z992 Dependence on renal dialysis: Secondary | ICD-10-CM | POA: Diagnosis not present

## 2023-12-04 DIAGNOSIS — Z992 Dependence on renal dialysis: Secondary | ICD-10-CM | POA: Diagnosis not present

## 2023-12-04 DIAGNOSIS — N186 End stage renal disease: Secondary | ICD-10-CM | POA: Diagnosis not present

## 2023-12-04 DIAGNOSIS — N2581 Secondary hyperparathyroidism of renal origin: Secondary | ICD-10-CM | POA: Diagnosis not present

## 2023-12-06 DIAGNOSIS — Z992 Dependence on renal dialysis: Secondary | ICD-10-CM | POA: Diagnosis not present

## 2023-12-06 DIAGNOSIS — N186 End stage renal disease: Secondary | ICD-10-CM | POA: Diagnosis not present

## 2023-12-06 DIAGNOSIS — N2581 Secondary hyperparathyroidism of renal origin: Secondary | ICD-10-CM | POA: Diagnosis not present

## 2023-12-08 ENCOUNTER — Telehealth: Payer: Self-pay

## 2023-12-08 DIAGNOSIS — N186 End stage renal disease: Secondary | ICD-10-CM

## 2023-12-08 DIAGNOSIS — N2581 Secondary hyperparathyroidism of renal origin: Secondary | ICD-10-CM | POA: Diagnosis not present

## 2023-12-08 DIAGNOSIS — Z992 Dependence on renal dialysis: Secondary | ICD-10-CM | POA: Diagnosis not present

## 2023-12-11 ENCOUNTER — Telehealth: Payer: Self-pay | Admitting: *Deleted

## 2023-12-11 DIAGNOSIS — Z992 Dependence on renal dialysis: Secondary | ICD-10-CM | POA: Diagnosis not present

## 2023-12-11 DIAGNOSIS — N186 End stage renal disease: Secondary | ICD-10-CM | POA: Diagnosis not present

## 2023-12-11 DIAGNOSIS — N2581 Secondary hyperparathyroidism of renal origin: Secondary | ICD-10-CM | POA: Diagnosis not present

## 2023-12-11 NOTE — Progress Notes (Unsigned)
 Complex Care Management Note Care Guide Note  12/11/2023 Name: [name] MRN: [id number] DOB: [birth year]   Complex Care Management Outreach Attempts: An unsuccessful telephone outreach was attempted today to offer the patient information about available complex care management services.  Follow Up Plan:  Additional outreach attempts will be made to offer the patient complex care management information and services.   Encounter Outcome:  No Answer  Gwenevere Ghazi  [facility]  [facility], [facility] Guide  Direct Dial: [phone]  Fax [phone]

## 2023-12-12 ENCOUNTER — Encounter: Payer: Self-pay | Admitting: Podiatry

## 2023-12-12 ENCOUNTER — Ambulatory Visit (INDEPENDENT_AMBULATORY_CARE_PROVIDER_SITE_OTHER): Payer: Medicare HMO | Admitting: Podiatry

## 2023-12-12 DIAGNOSIS — M79675 Pain in left toe(s): Secondary | ICD-10-CM | POA: Diagnosis not present

## 2023-12-12 DIAGNOSIS — L600 Ingrowing nail: Secondary | ICD-10-CM | POA: Diagnosis not present

## 2023-12-12 DIAGNOSIS — B351 Tinea unguium: Secondary | ICD-10-CM

## 2023-12-12 DIAGNOSIS — M79674 Pain in right toe(s): Secondary | ICD-10-CM | POA: Diagnosis not present

## 2023-12-12 NOTE — Progress Notes (Unsigned)
 Subjective:   Patient ID: [name], female   DOB: 58 y.o.   MRN: [id number]   HPI Chief Complaint  Patient presents with   Ingrown Toenail    RM#14 Bilateral big toe nails ingrown present for about 1 month.   59 year old female presents the office above concerns.  She seems to be getting tenderness on both of her big toenails for about 1 month bilateral nails are thick and discolored.  She is not reporting drainage or pus.  It is mild typically nails were causes discomfort.  A1c 5.2 on 08/23/2023  Review of Systems  All other systems reviewed and are negative.  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  Genes Analyzed (43 total): APC*, ATM*, AXIN2, BARD1, BMPR1A, BRCA1*, BRCA2*, BRIP1*, CDH1*, CDK4, CDKN2A, CHEK2*, DICER1, GALNT12, HOXB13, MEN1, MLH1*, MRE11A, MSH2*, MSH3, MSH6*, MUTYH*, NBN, NF1*, NTHL1, PALB2*, PMS2*, POLD1, POLE, PTEN*, RAD50, RAD51C*, RAD51D*, RET, SDHB, SDHD, SMAD4, SMARCA4, STK11 and TP53* (sequencing and deletion/duplication); EGFR (s   GERD 07/24/2006   GRAVE'S DISEASE 01/01/2008   History of hidradenitis suppurativa    History of kidney stones    History of thrush    HIV DISEASE  07/24/2006   dx March 05   Hyperlipidemia    HYPERTENSION 07/24/2006   Hyperthyroidism 08/2006   Grave's Disease -diffuse radiotracer uptake 08/25/06 Thyroid scan-Cold nodule to R lower lobe of thyrorid   Ileus (HCC) 02/14/2023   Menometrorrhagia    hx of   Nephrolithiasis    Panniculitis 05/12/2020   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HCC)    METASTATIC PAPILLARY THYROID CARCINOMA per 01/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 Completed 2 yrs Prednisone after lung bx confirmation. No symptoms since then.   Sebaceous cyst 04/21/2020   Suppurative hidradenitis    Thyroid cancer (HCC)    THYROID NODULE, RIGHT 02/08/2007    Past Surgical History:  Procedure Laterality Date   AORTIC ARCH DEBRANCHING N/A 08/25/2023   Procedure: AORTIC ARCH DEBRANCHING USING 12X8X8MM HEMASHIELD GRAFT;  Surgeon: [name], MD;  Location: [facility];  Service: Open Heart Surgery;  Laterality: N/A;  with bypasses to the innominate and left common carotid arteries   APPLICATION OF WOUND VAC N/A 01/20/2021  Procedure: APPLICATION OF WOUND VAC;  Surgeon: Peggye Form, DO;  Location: [facility];  Service: Plastics;  Laterality: N/A;   BREAST EXCISIONAL BIOPSY Right 04/26/2018   right axilla negative   BREAST EXCISIONAL BIOPSY Left 04/26/2018   left axilla negative   BREAST LUMPECTOMY Right 10/03/2018   malignant   BREAST LUMPECTOMY WITH RADIOACTIVE SEED AND SENTINEL LYMPH NODE BIOPSY Right 10/03/2018   Procedure: RIGHT BREAST LUMPECTOMY WITH RADIOACTIVE  SEED AND SENTINEL LYMPH NODE MAPPING;  Surgeon: Harriette Bouillon, MD;  Location: [facility];  Service: General;  Laterality: Right;   BREAST SURGERY  1997   Breast Reduction    CYSTOSCOPY W/ URETERAL STENT REMOVAL  11/09/2012   Procedure: CYSTOSCOPY WITH STENT REMOVAL;  Surgeon: Sebastian Ache, MD;  Location: [facility];  Service: Urology;  Laterality: Right;   CYSTOSCOPY WITH RETROGRADE PYELOGRAM, URETEROSCOPY AND STENT PLACEMENT  11/09/2012   Procedure: CYSTOSCOPY WITH RETROGRADE PYELOGRAM, URETEROSCOPY AND STENT PLACEMENT;  Surgeon: Sebastian Ache, MD;  Location: [facility];  Service: Urology;  Laterality: Left;  LEFT URETEROSCOPY, STONE MANIPULATION, left STENT exchange    CYSTOSCOPY WITH STENT PLACEMENT  10/02/2012   Procedure: CYSTOSCOPY WITH STENT PLACEMENT;  Surgeon: Sebastian Ache, MD;  Location: [facility];  Service: Urology;  Laterality: Left;   DEBRIDEMENT AND CLOSURE WOUND N/A 01/20/2021   Procedure: Excision of abdominal wound with closure;  Surgeon: Peggye Form, DO;  Location: [facility];  Service: Plastics;  Laterality: N/A;   DILATION AND CURETTAGE OF UTERUS  11/2002   s/p for 1st trimester nonviable pregnancy   EYE SURGERY     sty under eyelid   INCISE AND DRAIN ABCESS  08/2002   s/p I &D for righ inframmary fold hidradenitis   INCISION AND DRAINAGE PERITONSILLAR ABSCESS  12/2001   IR CV LINE INJECTION  06/07/2018   IR FLUORO GUIDE CV LINE RIGHT  01/30/2023   IR IMAGING GUIDED PORT INSERTION  06/20/2018   IR REMOVAL TUN ACCESS W/ PORT W/O FL MOD SED  06/20/2018   IR US GUIDE VASC ACCESS RIGHT  01/30/2023   IRRIGATION AND DEBRIDEMENT ABSCESS  01/31/2012   Procedure: IRRIGATION AND DEBRIDEMENT ABSCESS;  Surgeon: Kandis Cocking, MD;  Location: [facility];  Service: General;  Laterality: Right;  right breast and axilla    NEPHROLITHOTOMY  10/02/2012   Procedure: NEPHROLITHOTOMY PERCUTANEOUS;  Surgeon: Sebastian Ache, MD;  Location: [facility];  Service: Urology;  Laterality:  Right;  First Stage Percutaneous Nephrolithotomy with Surgeon Access, Left Ureteral Stent     NEPHROLITHOTOMY  10/04/2012   Procedure: NEPHROLITHOTOMY PERCUTANEOUS SECOND LOOK;  Surgeon: Sebastian Ache, MD;  Location: [facility];  Service: Urology;  Laterality: Right;      NEPHROLITHOTOMY  10/08/2012   Procedure: NEPHROLITHOTOMY PERCUTANEOUS;  Surgeon: Sebastian Ache, MD;  Location: [facility];  Service: Urology;  Laterality: Right;  THIRD STAGE, nephrostomy tube exchange x 2   NEPHROLITHOTOMY  10/11/2012   Procedure: NEPHROLITHOTOMY PERCUTANEOUS SECOND LOOK;  Surgeon: Sebastian Ache, MD;  Location: [facility];  Service: Urology;  Laterality: Right;  RIGHT 4 STAGE PERCUTANOUS NEPHROLITHOTOMY, right URETEROSCOPY WITH HOLMIUM LASER    PANNICULECTOMY N/A 12/21/2020   Procedure: PANNICULECTOMY;  Surgeon: Peggye Form, DO;  [facility]: [facility];  Service: Plastics;  Laterality: N/A;   PERCUTANEOUS NEPHROSTOLITHOTOMY  04/2022   PORT-A-CATH REMOVAL N/A 07/16/2020   Procedure: REMOVAL PORT-A-CATH;  Surgeon: Harriette Bouillon, MD;  Location: [facility];  Service: General;  Laterality: N/A;   PORTACATH  PLACEMENT Left 05/17/2018   Procedure: INSERTION PORT-A-CATH;  Surgeon: Abigail Miyamoto, MD;  Location: [facility];  Service: General;  Laterality: Left;   RADICAL NECK DISSECTION  04/12/2017   limited/notes 04/12/2017   RADICAL NECK DISSECTION N/A 04/12/2017   Procedure: RADICAL NECK DISSECTION;  Surgeon: Christia Reading, MD;  Location: [facility];  Service: ENT;  Laterality: N/A;  limited neck dissection 2 hours total   REDUCTION MAMMAPLASTY Bilateral 1998   RIGHT/LEFT HEART CATH AND CORONARY ANGIOGRAPHY N/A 03/12/2020   Procedure: RIGHT/LEFT HEART CATH AND CORONARY ANGIOGRAPHY;  Surgeon: Dolores Patty, MD;  [facility];  Service: Cardiovascular;  Laterality: N/A;   [place]  1994   TEE WITHOUT CARDIOVERSION N/A 08/25/2023   Procedure: TRANSESOPHAGEAL ECHOCARDIOGRAM;   Surgeon: [name], MD;  [facility]: [facility];  Service: Open Heart Surgery;  Laterality: N/A;   THORACIC AORTIC ENDOVASCULAR STENT GRAFT N/A 02/02/2023   Procedure: THORACIC AORTIC ENDOVASCULAR STENT GRAFT;  Surgeon: Victorino Sparrow, MD;  Location: [facility] OR;  Service: Vascular;  Laterality: N/A;   THORACIC AORTIC ENDOVASCULAR STENT GRAFT N/A 08/25/2023   Procedure: THORACIC AORTIC ENDOVASCULAR STENT GRAFT;  Surgeon: Victorino Sparrow, MD;  Location: [facility] OR;  Service: Vascular;  Laterality: N/A;   THYROIDECTOMY  04/12/2017   completion/notes 04/12/2017   THYROIDECTOMY N/A 04/12/2017   Procedure: THYROIDECTOMY;  Surgeon: Christia Reading, MD;  Location: [facility] OR;  Service: ENT;  Laterality: N/A;  Completion Thyroidectomy   TOTAL THYROIDECTOMY  2010   ULTRASOUND GUIDANCE FOR VASCULAR ACCESS Right 08/25/2023   Procedure: ULTRASOUND GUIDANCE FOR VASCULAR ACCESS, RIGHT FEMORAL ARTERY;  Surgeon: Victorino Sparrow, MD;  [facility];  Service: Vascular;  Laterality: Right;     Current Outpatient Medications:    acetaminophen (TYLENOL) 500 MG tablet, Take 1,000 mg by mouth every 6 (six) hours as needed for mild pain., Disp: , Rfl:    albuterol (VENTOLIN HFA) 108 (90 Base) MCG/ACT inhaler, INHALE 2 PUFFS INTO THE LUNGS UP TO EVERY 6HRS AS NEEDED FOR WHEEZING/SHORTNESS OF BREATH, Disp: 8 each, Rfl: 0   allopurinol (ZYLOPRIM) 100 MG tablet, TAKE 1 TABLET BY MOUTH EVERY DAY FOR GOUT PREVENTION, Disp: 90 tablet, Rfl: 2   amLODipine (NORVASC) 10 MG tablet, Take 10 mg by mouth daily., Disp: , Rfl:    aspirin EC 81 MG tablet, Take 81 mg by mouth daily., Disp: , Rfl:    AURYXIA 1 GM 210 MG(Fe) tablet, Take 210 mg by mouth 3 (three) times daily with meals., Disp: , Rfl:    Blood Glucose Monitoring Suppl (FREESTYLE LITE) w/Device KIT, Inject 1 each into the skin daily., Disp: 1 kit, Rfl: 0   dolutegravir ([name]) 50 MG tablet, Take 1 tablet (50 mg total) by mouth daily., Disp: 30 tablet, Rfl: 11   emtricitabine-tenofovir  AF (DESCOVY) 200-25 MG tablet, Take 1 tablet by mouth daily., Disp: 30 tablet, Rfl: 11   fluticasone (FLOVENT HFA) 110 MCG/ACT inhaler, Inhale 1 puff into the lungs 2 (two) times daily., Disp: , Rfl:    Fluticasone Furoate (ARNUITY ELLIPTA) 100 MCG/ACT AEPB, Inhale 1 puff into the lungs daily., Disp: 90 each, Rfl: 1   Lancets (ONETOUCH ULTRASOFT) lancets, Use as instructed to test blood sugar daily, Disp: 100 each, Rfl: 5   levocetirizine (XYZAL) 5 MG tablet, Take 1 tablet (5 mg total) by mouth every evening. For allergies, Disp: 90 tablet, Rfl: 3   levothyroxine (SYNTHROID) 175 MCG tablet, TAKE 1 TABLET BY MOUTH EVERY DAY,  Disp: 90 tablet, Rfl: 3   minocycline (DYNACIN) 100 MG tablet, Take 1 tablet (100 mg total) by mouth 2 (two) times daily. (Patient taking differently: Take 100 mg by mouth daily.), Disp: 60 tablet, Rfl: 11   rosuvastatin (CRESTOR) 10 MG tablet, TAKE 1 TABLET BY MOUTH EVERY DAY FOR CHOLESTEROL, Disp: 90 tablet, Rfl: 0   saxagliptin HCl (ONGLYZA) 2.5 MG TABS tablet, TAKE 1 TABLET (2.5 MG TOTAL) BY MOUTH DAILY. FOR DIABETES., Disp: 90 tablet, Rfl: 0   carvedilol (COREG) 12.5 MG tablet, Take 12.5 mg by mouth 2 (two) times daily., Disp: , Rfl:    losartan (COZAAR) 50 MG tablet, Take 50 mg by mouth daily., Disp: , Rfl:    traMADol (ULTRAM) 50 MG tablet, Take 1 tablet (50 mg total) by mouth every 6 (six) hours as needed., Disp: 30 tablet, Rfl: 0  Allergies  Allergen Reactions   Genvoya [Elviteg-Cobic-Emtricit-Tenofaf] Hives   Lisinopril Cough   Aldactone [Spironolactone] Hives   Tegaderm Ag Mesh [Silver] Itching           Objective:  Physical Exam  General: AAO x3, NAD  Dermatological: General the nails are hypertrophic, dystrophic with fungal debris present.  There is incurvation present both medial and lateral aspects of bilateral hallux toenails with distal portion.  All the nails have somewhat incurvation noted.  There is no edema, erythema or signs of  infection.  Vascular: Dorsalis Pedis artery and Posterior Tibial artery pedal pulses are 2/4 bilateral with immedate capillary fill time. There is no pain with calf compression, swelling, warmth, erythema.   Neruologic: Grossly intact via light touch bilateral.   Musculoskeletal: Second toenails no other areas of discomfort.      Assessment:   58 year old female with symptomatic onychomycosis, ingrown toenails     Plan:  -Treatment options discussed including all alternatives, risks, and complications -Etiology of symptoms were discussed -We discussed partial nail avulsions but initially agreed to hold off on this today given her other comorbidities.  However symptoms persist we will proceed with the partial nail avulsion.  I sharply debrided the nails x 10 without any complications or bleeding today. -Daily foot inspection  Return in about 3 months (around 03/10/2024).  Vivi Barrack DPM

## 2023-12-12 NOTE — Progress Notes (Unsigned)
 Complex Care Management Note Care Guide Note  12/12/2023 Name: [name] MRN: [id number] DOB: [birth year]   Complex Care Management Outreach Attempts: A second unsuccessful outreach was attempted today to offer the patient with information about available complex care management services.  Follow Up Plan:  Additional outreach attempts will be made to offer the patient complex care management information and services.   Encounter Outcome:  No Answer  Gwenevere Ghazi  [facility]  [facility], [facility] Guide  Direct Dial: [phone]  Fax [phone]

## 2023-12-13 DIAGNOSIS — N186 End stage renal disease: Secondary | ICD-10-CM | POA: Diagnosis not present

## 2023-12-13 DIAGNOSIS — N2581 Secondary hyperparathyroidism of renal origin: Secondary | ICD-10-CM | POA: Diagnosis not present

## 2023-12-13 DIAGNOSIS — Z992 Dependence on renal dialysis: Secondary | ICD-10-CM | POA: Diagnosis not present

## 2023-12-13 NOTE — Progress Notes (Signed)
 Complex Care Management Note  Care Guide Note 12/13/2023 Name: [name] MRN: [id number] DOB: [birth year]  [name] is a 58 y.o. year old female who sees Doreene Nest, NP for primary care. I reached out to [name] by phone today to offer complex care management services.  [name] was given information about Complex Care Management services today including:   The Complex Care Management services include support from the care team which includes your Nurse Care Manager, Clinical Social Worker, or Pharmacist.  The Complex Care Management team is here to help remove barriers to the health concerns and goals most important to you. Complex Care Management services are voluntary, and the patient may decline or stop services at any time by request to their care team member.   Complex Care Management Consent Status: Patient agreed to services and verbal consent obtained.   Follow up plan:  Telephone appointment with complex care management team member scheduled for:  3/4  Encounter Outcome:  Patient Scheduled  Gwenevere Ghazi  [facility]  [facility], [facility] Guide  Direct Dial: [phone]  Fax [phone]

## 2023-12-14 ENCOUNTER — Ambulatory Visit (INDEPENDENT_AMBULATORY_CARE_PROVIDER_SITE_OTHER)
Admission: RE | Admit: 2023-12-14 | Discharge: 2023-12-14 | Disposition: A | Discharge status: Home / Self Care | Payer: Medicare HMO | Source: Ambulatory Visit | Attending: Vascular Surgery | Admitting: Vascular Surgery

## 2023-12-14 ENCOUNTER — Encounter: Payer: Self-pay | Admitting: Physician Assistant

## 2023-12-14 ENCOUNTER — Ambulatory Visit (INDEPENDENT_AMBULATORY_CARE_PROVIDER_SITE_OTHER): Payer: Medicare HMO | Admitting: Physician Assistant

## 2023-12-14 ENCOUNTER — Ambulatory Visit (HOSPITAL_COMMUNITY)
Admission: RE | Admit: 2023-12-14 | Discharge: 2023-12-14 | Disposition: A | Discharge status: Home / Self Care | Payer: Medicare HMO | Source: Ambulatory Visit | Attending: Vascular Surgery | Admitting: Vascular Surgery

## 2023-12-14 VITALS — BP 95/65 | HR 88 | Temp 98.2°F | Ht 65.0 in | Wt 177.0 lb

## 2023-12-14 DIAGNOSIS — I82622 Acute embolism and thrombosis of deep veins of left upper extremity: Secondary | ICD-10-CM | POA: Insufficient documentation

## 2023-12-14 DIAGNOSIS — N186 End stage renal disease: Secondary | ICD-10-CM

## 2023-12-14 DIAGNOSIS — I7103 Dissection of thoracoabdominal aorta: Secondary | ICD-10-CM | POA: Diagnosis not present

## 2023-12-14 DIAGNOSIS — Z992 Dependence on renal dialysis: Secondary | ICD-10-CM | POA: Insufficient documentation

## 2023-12-14 LAB — VAS US ABI WITH/WO TBI
Left ABI: 1.13
Right ABI: 1.22

## 2023-12-14 MED ORDER — TRAMADOL HCL 50 MG PO TABS: 50.0000 mg | ORAL_TABLET | Freq: Four times a day (QID) | ORAL | 0 refills | Status: DC | PRN

## 2023-12-14 NOTE — H&P (View-Only) (Signed)
 [facility] [place] HISTORY AND PHYSICAL   History of Present Illness:  Patient is a 58 y.o. year old female who presents for evaluation of  To be considered for PD.  She is also here for ABI's to f/u after  arch to branching with Dr. Karin Lieu and Dr. 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  Postop course complicated by left upper extremity DVT for which Eliquis was started.   Over all she is recovering remarkable well.  She has on/off lumbar pain and has to stop and rest.  She also feels weak in her legs at times.  There is no pattern.  This does not sound like frank claudication.  She denies claudication in the calf's, non healing wounds or rest pain that wakes her from sleep.  He nephrologist Dr. Thedore Mins has referred her PD placement.  She currently has a working right internal jugular TDC.  She would like to avoid HD.  She has a history of  Panniculectomy and no intraabdominal surgeries.   She has been medically managed on Eliquis for the left UE DVT and this is her last day of taking the Eliquis.  She will cont ASA and Statin daily.     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  Genes Analyzed (43 total): APC*, ATM*, AXIN2, BARD1, BMPR1A, BRCA1*, BRCA2*, BRIP1*, CDH1*, CDK4, CDKN2A, CHEK2*, DICER1, GALNT12, HOXB13, MEN1, MLH1*, MRE11A, MSH2*, MSH3, MSH6*, MUTYH*, NBN, NF1*, NTHL1, PALB2*, PMS2*, POLD1, POLE, PTEN*, RAD50, RAD51C*, RAD51D*, RET, SDHB, SDHD, SMAD4, SMARCA4, STK11 and TP53* (sequencing and deletion/duplication); EGFR (s   GERD 07/24/2006   GRAVE'S DISEASE 01/01/2008   History of hidradenitis suppurativa    History of kidney stones    History of thrush    HIV DISEASE 07/24/2006   dx March 05   Hyperlipidemia    HYPERTENSION 07/24/2006   Hyperthyroidism 08/2006   Grave's Disease -diffuse radiotracer uptake 08/25/06 Thyroid scan-Cold nodule to R lower lobe of thyrorid   Ileus (HCC) 02/14/2023   Menometrorrhagia    hx of   Nephrolithiasis    Panniculitis 05/12/2020   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HCC)    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 Completed 2 yrs Prednisone after lung bx confirmation. No symptoms since then.   Sebaceous cyst 04/21/2020   Suppurative hidradenitis    Thyroid cancer (HCC)    THYROID NODULE, RIGHT 02/08/2007    Past Surgical History:  Procedure Laterality Date   AORTIC ARCH DEBRANCHING N/A 08/25/2023   Procedure: AORTIC ARCH DEBRANCHING USING 12X8X8MM HEMASHIELD GRAFT;  Surgeon: [name], MD;  Location: [facility];  Service: Open Heart Surgery;  Laterality: N/A;  with bypasses to the innominate and left common carotid  arteries   APPLICATION OF WOUND VAC N/A 01/20/2021   Procedure: APPLICATION OF WOUND VAC;  Surgeon: Peggye Form, DO;  Location: [facility];  Service: Plastics;  Laterality: N/A;   BREAST EXCISIONAL BIOPSY Right 04/26/2018   right axilla negative   BREAST EXCISIONAL BIOPSY Left 04/26/2018   left axilla negative   BREAST LUMPECTOMY Right 10/03/2018   malignant   BREAST LUMPECTOMY WITH RADIOACTIVE SEED AND SENTINEL LYMPH NODE BIOPSY Right 10/03/2018   Procedure: RIGHT BREAST LUMPECTOMY WITH RADIOACTIVE SEED AND SENTINEL LYMPH NODE MAPPING;  Surgeon: Harriette Bouillon, MD;  Location: [facility];  Service: General;  Laterality: Right;   BREAST SURGERY  1997   Breast Reduction    CYSTOSCOPY W/ URETERAL STENT REMOVAL  11/09/2012   Procedure: CYSTOSCOPY WITH STENT REMOVAL;  Surgeon: Sebastian Ache, MD;  Location: [facility];  Service: Urology;  Laterality: Right;   CYSTOSCOPY WITH RETROGRADE PYELOGRAM, URETEROSCOPY AND STENT PLACEMENT  11/09/2012   Procedure: CYSTOSCOPY WITH RETROGRADE PYELOGRAM, URETEROSCOPY AND STENT PLACEMENT;  Surgeon: Sebastian Ache, MD;  Location: [facility];  Service: Urology;  Laterality: Left;  LEFT URETEROSCOPY, STONE MANIPULATION, left STENT exchange    CYSTOSCOPY WITH STENT PLACEMENT  10/02/2012   Procedure: CYSTOSCOPY WITH STENT PLACEMENT;  Surgeon: Sebastian Ache, MD;  Location: [facility];  Service: Urology;  Laterality: Left;   DEBRIDEMENT AND CLOSURE WOUND N/A 01/20/2021   Procedure: Excision of abdominal wound with closure;  Surgeon: Peggye Form, DO;  Location: [facility];  Service: Plastics;  Laterality: N/A;   DILATION AND CURETTAGE OF UTERUS  11/2002   s/p for 1st trimester nonviable pregnancy   EYE SURGERY     sty under eyelid   INCISE AND DRAIN ABCESS  08/2002   s/p I &D for righ inframmary fold hidradenitis   INCISION AND DRAINAGE PERITONSILLAR ABSCESS  12/2001   IR CV LINE INJECTION  06/07/2018   IR FLUORO GUIDE CV LINE  RIGHT  01/30/2023   IR IMAGING GUIDED PORT INSERTION  06/20/2018   IR REMOVAL TUN ACCESS W/ PORT W/O FL MOD SED  06/20/2018   IR US GUIDE VASC ACCESS RIGHT  01/30/2023   IRRIGATION AND DEBRIDEMENT ABSCESS  01/31/2012   Procedure: IRRIGATION AND DEBRIDEMENT ABSCESS;  Surgeon: Kandis Cocking, MD;  Location: [facility];  Service: General;  Laterality: Right;  right breast and axilla    NEPHROLITHOTOMY  10/02/2012   Procedure: NEPHROLITHOTOMY PERCUTANEOUS;  Surgeon: Sebastian Ache, MD;  Location: [facility];  Service: Urology;  Laterality: Right;  First Stage Percutaneous Nephrolithotomy with Surgeon Access, Left Ureteral Stent     NEPHROLITHOTOMY  10/04/2012   Procedure: NEPHROLITHOTOMY PERCUTANEOUS SECOND LOOK;  Surgeon: Sebastian Ache, MD;  Location: [facility];  Service: Urology;  Laterality: Right;      NEPHROLITHOTOMY  10/08/2012   Procedure: NEPHROLITHOTOMY PERCUTANEOUS;  Surgeon: Sebastian Ache, MD;  Location: [facility];  Service: Urology;  Laterality: Right;  THIRD STAGE, nephrostomy tube exchange x 2   NEPHROLITHOTOMY  10/11/2012   Procedure: NEPHROLITHOTOMY PERCUTANEOUS SECOND LOOK;  Surgeon: Sebastian Ache, MD;  Location: [facility];  Service: Urology;  Laterality: Right;  RIGHT 4 STAGE PERCUTANOUS NEPHROLITHOTOMY, right URETEROSCOPY WITH HOLMIUM LASER    PANNICULECTOMY N/A 12/21/2020   Procedure: PANNICULECTOMY;  Surgeon: Peggye Form, DO;  [facility]: [facility];  Service: Plastics;  Laterality: N/A;   PERCUTANEOUS NEPHROSTOLITHOTOMY  04/2022   PORT-A-CATH REMOVAL N/A 07/16/2020   Procedure: REMOVAL PORT-A-CATH;  Surgeon: Harriette Bouillon, MD;  Location: [facility];  Service: General;  Laterality: N/A;   PORTACATH PLACEMENT Left 05/17/2018   Procedure: INSERTION PORT-A-CATH;  Surgeon: Abigail Miyamoto, MD;  Location: [facility];  Service: General;  Laterality: Left;   RADICAL NECK DISSECTION  04/12/2017   limited/notes 04/12/2017   RADICAL NECK DISSECTION N/A 04/12/2017    Procedure: RADICAL NECK DISSECTION;  Surgeon: Christia Reading, MD;  Location: [facility] OR;  Service: ENT;  Laterality: N/A;  limited neck dissection 2 hours total   REDUCTION MAMMAPLASTY Bilateral 1998   RIGHT/LEFT HEART CATH AND CORONARY ANGIOGRAPHY N/A 03/12/2020   Procedure: RIGHT/LEFT HEART CATH AND CORONARY ANGIOGRAPHY;  Surgeon: Dolores Patty, MD;  [facility];  Service: Cardiovascular;  Laterality: N/A;   [place]  1994   TEE WITHOUT CARDIOVERSION N/A 08/25/2023   Procedure: TRANSESOPHAGEAL ECHOCARDIOGRAM;  Surgeon: [name], MD;  Location: [facility];  Service: Open Heart Surgery;  Laterality: N/A;   THORACIC AORTIC ENDOVASCULAR STENT GRAFT N/A 02/02/2023   Procedure: THORACIC AORTIC ENDOVASCULAR STENT GRAFT;  Surgeon: Victorino Sparrow, MD;  Location: [facility] OR;  Service: Vascular;  Laterality: N/A;   THORACIC AORTIC ENDOVASCULAR STENT GRAFT N/A 08/25/2023   Procedure: THORACIC AORTIC ENDOVASCULAR STENT GRAFT;  Surgeon: Victorino Sparrow, MD;  Location: [facility] OR;  Service: Vascular;  Laterality: N/A;   THYROIDECTOMY  04/12/2017   completion/notes 04/12/2017   THYROIDECTOMY N/A 04/12/2017   Procedure: THYROIDECTOMY;  Surgeon: Christia Reading, MD;  Location: [facility] OR;  Service: ENT;  Laterality: N/A;  Completion Thyroidectomy   TOTAL THYROIDECTOMY  2010   ULTRASOUND GUIDANCE FOR VASCULAR ACCESS Right 08/25/2023   Procedure: ULTRASOUND GUIDANCE FOR VASCULAR ACCESS, RIGHT FEMORAL ARTERY;  Surgeon: Victorino Sparrow, MD;  Location: [facility];  Service: Vascular;  Laterality: Right;    ROS:   General:  No weight loss, Fever, chills  HEENT: No recent headaches, no nasal bleeding, no visual changes, no sore throat  Neurologic: No dizziness, blackouts, seizures. No recent symptoms of stroke or mini- stroke. No recent episodes of slurred speech, or temporary blindness.  Cardiac: No recent episodes of chest pain/pressure, no shortness of breath at rest.  No shortness of breath with exertion.  Denies  history of atrial fibrillation or irregular heartbeat  Vascular: No history of rest pain in feet.  No history of claudication.  No history of non-healing ulcer, No history of DVT   Pulmonary: No home oxygen, no productive cough, no hemoptysis,  No asthma or wheezing  Musculoskeletal:  [ ]  Arthritis, [ x] Low back pain,  [ ]  Joint pain  Hematologic:No history of hypercoagulable state.  No history of easy bleeding.  No history of anemia  Gastrointestinal: No hematochezia or melena,  No gastroesophageal reflux, no trouble swallowing  Urinary: [ ]  chronic Kidney disease, [x ] on HD - [ ]  MWF or [ ]  TTHS, [ ]  Burning with urination, [ ]  Frequent urination, [ ]  Difficulty  urinating;   Skin: No rashes  Psychological: No history of anxiety,  No history of depression  Social History Social History   Tobacco Use   Smoking status: Former    Current packs/day: 0.00    Average packs/day: 0.5 packs/day for 15.0 years (7.5 ttl pk-yrs)    Types: Cigarettes    Start date: 04/12/2017    Quit date: 2019    Years since quitting: 6.1   Smokeless tobacco: Never  Vaping Use   Vaping status: Never Used  Substance Use Topics   Alcohol use: Yes    Alcohol/week: 0.0 standard drinks of alcohol    Comment: social   Drug use: No    Family History Family History  Problem Relation Age of Onset   Hypertension Mother    Cancer Mother        laryngeal   Heart disease Mother        stent   Pancreatic cancer Father    Hypertension Father    Lung cancer Father 90       hx smoking   Hypertension Sister    Hypertension Sister    Hypertension Brother    Breast cancer Maternal Aunt 52   Breast cancer Maternal Aunt        dx 60+   Cancer Maternal Uncle        Lung CA   Breast cancer Paternal Aunt 13   Breast cancer Paternal Aunt        dx 53's   Breast cancer Paternal Aunt        dx 50's   Prostate cancer Paternal Uncle    Prostate cancer Paternal Uncle    Lung cancer Paternal Uncle    Breast  cancer Cousin 62   Breast cancer Cousin        dx <50   Breast cancer Cousin        dx <50   Breast cancer Cousin        dx <50   Heart disease Other    Hypertension Other    Stroke Other        Grandparent   Kidney disease Other        Grandparent   Diabetes Other        FH of Diabetes   Colon cancer Neg Hx    Esophageal cancer Neg Hx    Rectal cancer Neg Hx    Stomach cancer Neg Hx     Allergies  Allergies  Allergen Reactions   Genvoya [Elviteg-Cobic-Emtricit-Tenofaf] Hives   Lisinopril Cough   Aldactone [Spironolactone] Hives   Tegaderm Ag Mesh [Silver] Itching     Current Outpatient Medications  Medication Sig Dispense Refill   traMADol (ULTRAM) 50 MG tablet Take 1 tablet (50 mg total) by mouth every 6 (six) hours as needed. 30 tablet 0   acetaminophen (TYLENOL) 500 MG tablet Take 1,000 mg by mouth every 6 (six) hours as needed for mild pain.     albuterol (VENTOLIN HFA) 108 (90 Base) MCG/ACT inhaler INHALE 2 PUFFS INTO THE LUNGS UP TO EVERY 6HRS AS NEEDED FOR WHEEZING/SHORTNESS OF BREATH 8 each 0   allopurinol (ZYLOPRIM) 100 MG tablet TAKE 1 TABLET BY MOUTH EVERY DAY FOR GOUT PREVENTION 90 tablet 2   amLODipine (NORVASC) 5 MG tablet Take 10 mg by mouth daily.     apixaban (ELIQUIS) 5 MG TABS tablet Take 1 tablet (5 mg total) by mouth 2 (two) times daily. 60 tablet 3   aspirin 81  MG chewable tablet Chew 81 mg by mouth daily.     AURYXIA 1 GM 210 MG(Fe) tablet Take 210 mg by mouth 3 (three) times daily with meals.     Blood Glucose Monitoring Suppl (FREESTYLE LITE) w/Device KIT Inject 1 each into the skin daily. 1 kit 0   carvedilol (COREG) 6.25 MG tablet Take 1 tablet (6.25 mg total) by mouth 2 (two) times daily with a meal. 60 tablet 3   dolutegravir ([name]) 50 MG tablet Take 1 tablet (50 mg total) by mouth daily. 30 tablet 11   emtricitabine-tenofovir AF (DESCOVY) 200-25 MG tablet Take 1 tablet by mouth daily. 30 tablet 11   fluticasone (FLOVENT HFA) 110 MCG/ACT  inhaler Inhale 1 puff into the lungs 2 (two) times daily.     Fluticasone Furoate (ARNUITY ELLIPTA) 100 MCG/ACT AEPB Inhale 1 puff into the lungs daily. 90 each 1   Lancets (ONETOUCH ULTRASOFT) lancets Use as instructed to test blood sugar daily 100 each 5   levocetirizine (XYZAL) 5 MG tablet Take 1 tablet (5 mg total) by mouth every evening. For allergies 90 tablet 3   levothyroxine (SYNTHROID) 175 MCG tablet TAKE 1 TABLET BY MOUTH EVERY DAY 90 tablet 3   losartan (COZAAR) 50 MG tablet Take 1 tablet (50 mg total) by mouth daily. for blood pressure. 90 tablet 3   methocarbamol (ROBAXIN) 500 MG tablet Take 1 tablet (500 mg total) by mouth every 6 (six) hours as needed for muscle spasms. 30 tablet 0   metoCLOPramide (REGLAN) 5 MG tablet Take 1 tablet (5 mg total) by mouth every 6 (six) hours as needed for nausea. 20 tablet 0   minocycline (DYNACIN) 100 MG tablet Take 1 tablet (100 mg total) by mouth 2 (two) times daily. (Patient taking differently: Take 100 mg by mouth daily.) 60 tablet 11   omeprazole (PRILOSEC) 40 MG capsule Take 1 capsule (40 mg total) by mouth 2 (two) times daily before a meal. 180 capsule 0   ondansetron (ZOFRAN) 4 MG tablet Take 1 tablet (4 mg total) by mouth daily as needed for nausea or vomiting. (Patient not taking: Reported on 12/12/2023) 20 tablet 0   oxyCODONE-acetaminophen (PERCOCET/ROXICET) 5-325 MG tablet Take 1-2 tablets by mouth every 8 (eight) hours as needed for severe pain (pain score 7-10). (Patient not taking: Reported on 12/14/2023) 6 tablet 0   rosuvastatin (CRESTOR) 10 MG tablet TAKE 1 TABLET BY MOUTH EVERY DAY FOR CHOLESTEROL 90 tablet 0   saxagliptin HCl (ONGLYZA) 2.5 MG TABS tablet TAKE 1 TABLET (2.5 MG TOTAL) BY MOUTH DAILY. FOR DIABETES. 90 tablet 0   No current facility-administered medications for this visit.    Physical Examination  Vitals:   12/14/23 1348  BP: 95/65  Pulse: 88  Temp: 98.2 F (36.8 C)  SpO2: 99%  Weight: 177 lb (80.3 kg)   Height: 5\' 5"  (1.651 m)    Body mass index is 29.45 kg/m.  General:  Alert and oriented, no acute distress HEENT: Normal Neck: No bruit or JVD Pulmonary: Clear to auscultation bilaterally Cardiac: Regular Rate and Rhythm without murmur Abdomen: Soft, non-tender, non-distended, no mass, no scars Skin: No rash Extremity Pulses:radial,  femoral, dorsalis pedis, pulses bilaterally Musculoskeletal: No deformity or edema  Neurologic: Upper and lower extremity motor grossly intact and symmetric  DATA:  ABI Findings:  +---------+------------------+-----+-----------+--------+  Right   Rt Pressure (mmHg)IndexWaveform   Comment   +---------+------------------+-----+-----------+--------+  Brachial 107                                         +---------+------------------+-----+-----------+--------+  PTA     131               1.22 multiphasic          +---------+------------------+-----+-----------+--------+  DP      126               1.18 multiphasic          +---------+------------------+-----+-----------+--------+  Great Toe90                0.84 Normal               +---------+------------------+-----+-----------+--------+   +---------+------------------+-----+-----------+-------+  Left    Lt Pressure (mmHg)IndexWaveform   Comment  +---------+------------------+-----+-----------+-------+  Brachial 82                                         +---------+------------------+-----+-----------+-------+  PTA     116               1.08 multiphasic         +---------+------------------+-----+-----------+-------+  DP      121               1.13 multiphasic         +---------+------------------+-----+-----------+-------+  Great Toe87                0.81 Normal              +---------+------------------+-----+-----------+-------+    +-------+-----------+-----------+------------+------------+  ABI/TBIToday's ABIToday's TBIPrevious  ABIPrevious TBI  +-------+-----------+-----------+------------+------------+  Right 1.22       0.84       1.20        0.86          +-------+-----------+-----------+------------+------------+  Left  1.13       0.81       1.15        0.85          +-------+-----------+-----------+------------+------------+    Bilateral ABIs and TBIs appear essentially unchanged compared to prior  study on 10/03/2023.    Summary:  Right: Resting right ankle-brachial index is within normal range. The  right toe-brachial index is normal.   Left: Resting left ankle-brachial index is within normal range. The left  toe-brachial index is normal.   Right Findings:  +-----+------------+---------+-----------+----------+-------+  RIGHTCompressiblePhasicitySpontaneousPropertiesSummary  +-----+------------+---------+-----------+----------+-------+  IJV     Full       Yes                                 +-----+------------+---------+-----------+----------+-------+     Left Findings:  +----------+------------+---------+-----------+----------+-------+  LEFT     CompressiblePhasicitySpontaneousPropertiesSummary  +----------+------------+---------+-----------+----------+-------+  IJV          Full       Yes                                 +----------+------------+---------+-----------+----------+-------+  Subclavian   Full       Yes                                 +----------+------------+---------+-----------+----------+-------+  Axillary     Full       Yes                                 +----------+------------+---------+-----------+----------+-------+  Brachial     Full       Yes                                 +----------+------------+---------+-----------+----------+-------+  Radial       Full                                           +----------+------------+---------+-----------+----------+-------+  Ulnar        Full                                            +----------+------------+---------+-----------+----------+-------+  Cephalic     Full                                           +----------+------------+---------+-----------+----------+-------+  Basilic      Full                                           +----------+------------+---------+-----------+----------+-------+     Summary:    Right:  No evidence of thrombosis in the internal jugular vein.    Left:  No evidence of acute deep vein thrombosis in the upper extremity. No  evidence of  acute superficial vein thrombosis in the upper extremity.    -----------------+-------------+----------+--------------+  Right Cephalic   Diameter (cm)Depth (cm)   Findings     +-----------------+-------------+----------+--------------+  Shoulder            0.43        1.00                   +-----------------+-------------+----------+--------------+  Prox upper arm       0.40        0.70                   +-----------------+-------------+----------+--------------+  Mid upper arm        0.46        0.70                   +-----------------+-------------+----------+--------------+  Dist upper arm       0.37        0.23     branching     +-----------------+-------------+----------+--------------+  Antecubital fossa                       not visualized  +-----------------+-------------+----------+--------------+  Prox forearm                            not visualized  +-----------------+-------------+----------+--------------+  Mid forearm                             not visualized  +-----------------+-------------+----------+--------------+  Dist forearm                            not visualized  +-----------------+-------------+----------+--------------+  Wrist                                  not visualized  +-----------------+-------------+----------+--------------+    +-----------------+-------------+----------+---------+  Right Basilic    Diameter (cm)Depth (cm)Findings   +-----------------+-------------+----------+---------+  Prox upper arm       0.50        2.00              +-----------------+-------------+----------+---------+  Mid upper arm        0.50        1.80              +-----------------+-------------+----------+---------+  Dist upper arm       0.42        1.40              +-----------------+-------------+----------+---------+  Antecubital fossa    0.35        1.20   branching  +-----------------+-------------+----------+---------+  Prox forearm         0.30        0.08              +-----------------+-------------+----------+---------+  Mid forearm          0.30        0.10              +-----------------+-------------+----------+---------+  Distal forearm       0.25        0.13              +-----------------+-------------+----------+---------+  Wrist               0.25        1.00              +-----------------+-------------+----------+---------+   +-----------------+-------------+----------+---------+  Left Cephalic    Diameter (cm)Depth (cm)Findings   +-----------------+-------------+----------+---------+  Shoulder            0.44        1.10   branching  +-----------------+-------------+----------+---------+  Prox upper arm       0.50        0.63   branching  +-----------------+-------------+----------+---------+  Mid upper arm        0.52        0.58              +-----------------+-------------+----------+---------+  Dist upper arm       0.39        0.33              +-----------------+-------------+----------+---------+  Antecubital fossa    0.25        0.67              +-----------------+-------------+----------+---------+  Prox forearm         0.30        0.40              +-----------------+-------------+----------+---------+  Mid  forearm          0.33        0.30              +-----------------+-------------+----------+---------+  Dist forearm         0.21        0.30              +-----------------+-------------+----------+---------+  Wrist               0.20  0.28              +-----------------+-------------+----------+---------+   +-----------------+-------------+----------+--------+  Left Basilic     Diameter (cm)Depth (cm)Findings  +-----------------+-------------+----------+--------+  Prox upper arm       0.37        2.00             +-----------------+-------------+----------+--------+  Mid upper arm        0.46        2.40             +-----------------+-------------+----------+--------+  Dist upper arm       0.42        1.50             +-----------------+-------------+----------+--------+  Antecubital fossa    0.35        0.44             +-----------------+-------------+----------+--------+  Prox forearm         0.32        0.28             +-----------------+-------------+----------+--------+  Mid forearm          0.24        0.12             +-----------------+-------------+----------+--------+  Distal forearm       0.21        0.11             +-----------------+-------------+----------+--------+  Wrist               0.14        0.18             +-----------------+-------------+----------+--------+    ASSESSMENT/PLAN:   Patient is a 58 y.o. year old female who presents for evaluation of  To be considered for PD.  She is also here for ABI's to f/u after  arch to branching with Dr. Karin Lieu and Dr. 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  Postop course complicated by left upper extremity DVT for which Eliquis was started.   No evidence of DVT in the left UE on duplex today.     She has completed her approval process for PD catheter placement by Nephrology Dr. Thedore Mins.  Her LE have  excellent inflow with Multiphasic wave forms and palpable pedal pulses.   The vein mapping shows she has acceptable veins for future needs if she has to have HD in the future.    She may need to follow up with her PCP and be considered for spine consult if her lumbar pain continues.  It does not seem to have a radicular component.  She does seen deconditioned from these events and may just need time to get stronger.    She will be scheduled for PD catheter placement with Dr. Lenell Antu.  HD is MWF.  I discussed this with Dr. Karin Lieu in clinic     Mosetta Pigeon PA-C [facility] Office: [phone]  MD in clinic [place]

## 2023-12-14 NOTE — Progress Notes (Signed)
 [facility] [place] HISTORY AND PHYSICAL   History of Present Illness:  Patient is a 58 y.o. year old female who presents for evaluation of  To be considered for PD.  She is also here for ABI's to f/u after  arch to branching with Dr. Karin Lieu and Dr. 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  Postop course complicated by left upper extremity DVT for which Eliquis was started.   Over all she is recovering remarkable well.  She has on/off lumbar pain and has to stop and rest.  She also feels weak in her legs at times.  There is no pattern.  This does not sound like frank claudication.  She denies claudication in the calf's, non healing wounds or rest pain that wakes her from sleep.  He nephrologist Dr. Thedore Mins has referred her PD placement.  She currently has a working right internal jugular TDC.  She would like to avoid HD.  She has a history of  Panniculectomy and no intraabdominal surgeries.   She has been medically managed on Eliquis for the left UE DVT and this is her last day of taking the Eliquis.  She will cont ASA and Statin daily.     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  Genes Analyzed (43 total): APC*, ATM*, AXIN2, BARD1, BMPR1A, BRCA1*, BRCA2*, BRIP1*, CDH1*, CDK4, CDKN2A, CHEK2*, DICER1, GALNT12, HOXB13, MEN1, MLH1*, MRE11A, MSH2*, MSH3, MSH6*, MUTYH*, NBN, NF1*, NTHL1, PALB2*, PMS2*, POLD1, POLE, PTEN*, RAD50, RAD51C*, RAD51D*, RET, SDHB, SDHD, SMAD4, SMARCA4, STK11 and TP53* (sequencing and deletion/duplication); EGFR (s   GERD 07/24/2006   GRAVE'S DISEASE 01/01/2008   History of hidradenitis suppurativa    History of kidney stones    History of thrush    HIV DISEASE 07/24/2006   dx March 05   Hyperlipidemia    HYPERTENSION 07/24/2006   Hyperthyroidism 08/2006   Grave's Disease -diffuse radiotracer uptake 08/25/06 Thyroid scan-Cold nodule to R lower lobe of thyrorid   Ileus (HCC) 02/14/2023   Menometrorrhagia    hx of   Nephrolithiasis    Panniculitis 05/12/2020   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HCC)    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 Completed 2 yrs Prednisone after lung bx confirmation. No symptoms since then.   Sebaceous cyst 04/21/2020   Suppurative hidradenitis    Thyroid cancer (HCC)    THYROID NODULE, RIGHT 02/08/2007    Past Surgical History:  Procedure Laterality Date   AORTIC ARCH DEBRANCHING N/A 08/25/2023   Procedure: AORTIC ARCH DEBRANCHING USING 12X8X8MM HEMASHIELD GRAFT;  Surgeon: [name], MD;  Location: [facility];  Service: Open Heart Surgery;  Laterality: N/A;  with bypasses to the innominate and left common carotid  arteries   APPLICATION OF WOUND VAC N/A 01/20/2021   Procedure: APPLICATION OF WOUND VAC;  Surgeon: Peggye Form, DO;  Location: [facility];  Service: Plastics;  Laterality: N/A;   BREAST EXCISIONAL BIOPSY Right 04/26/2018   right axilla negative   BREAST EXCISIONAL BIOPSY Left 04/26/2018   left axilla negative   BREAST LUMPECTOMY Right 10/03/2018   malignant   BREAST LUMPECTOMY WITH RADIOACTIVE SEED AND SENTINEL LYMPH NODE BIOPSY Right 10/03/2018   Procedure: RIGHT BREAST LUMPECTOMY WITH RADIOACTIVE SEED AND SENTINEL LYMPH NODE MAPPING;  Surgeon: Harriette Bouillon, MD;  Location: [facility];  Service: General;  Laterality: Right;   BREAST SURGERY  1997   Breast Reduction    CYSTOSCOPY W/ URETERAL STENT REMOVAL  11/09/2012   Procedure: CYSTOSCOPY WITH STENT REMOVAL;  Surgeon: Sebastian Ache, MD;  Location: [facility];  Service: Urology;  Laterality: Right;   CYSTOSCOPY WITH RETROGRADE PYELOGRAM, URETEROSCOPY AND STENT PLACEMENT  11/09/2012   Procedure: CYSTOSCOPY WITH RETROGRADE PYELOGRAM, URETEROSCOPY AND STENT PLACEMENT;  Surgeon: Sebastian Ache, MD;  Location: [facility];  Service: Urology;  Laterality: Left;  LEFT URETEROSCOPY, STONE MANIPULATION, left STENT exchange    CYSTOSCOPY WITH STENT PLACEMENT  10/02/2012   Procedure: CYSTOSCOPY WITH STENT PLACEMENT;  Surgeon: Sebastian Ache, MD;  Location: [facility];  Service: Urology;  Laterality: Left;   DEBRIDEMENT AND CLOSURE WOUND N/A 01/20/2021   Procedure: Excision of abdominal wound with closure;  Surgeon: Peggye Form, DO;  Location: [facility];  Service: Plastics;  Laterality: N/A;   DILATION AND CURETTAGE OF UTERUS  11/2002   s/p for 1st trimester nonviable pregnancy   EYE SURGERY     sty under eyelid   INCISE AND DRAIN ABCESS  08/2002   s/p I &D for righ inframmary fold hidradenitis   INCISION AND DRAINAGE PERITONSILLAR ABSCESS  12/2001   IR CV LINE INJECTION  06/07/2018   IR FLUORO GUIDE CV LINE  RIGHT  01/30/2023   IR IMAGING GUIDED PORT INSERTION  06/20/2018   IR REMOVAL TUN ACCESS W/ PORT W/O FL MOD SED  06/20/2018   IR US GUIDE VASC ACCESS RIGHT  01/30/2023   IRRIGATION AND DEBRIDEMENT ABSCESS  01/31/2012   Procedure: IRRIGATION AND DEBRIDEMENT ABSCESS;  Surgeon: Kandis Cocking, MD;  Location: [facility];  Service: General;  Laterality: Right;  right breast and axilla    NEPHROLITHOTOMY  10/02/2012   Procedure: NEPHROLITHOTOMY PERCUTANEOUS;  Surgeon: Sebastian Ache, MD;  Location: [facility];  Service: Urology;  Laterality: Right;  First Stage Percutaneous Nephrolithotomy with Surgeon Access, Left Ureteral Stent     NEPHROLITHOTOMY  10/04/2012   Procedure: NEPHROLITHOTOMY PERCUTANEOUS SECOND LOOK;  Surgeon: Sebastian Ache, MD;  Location: [facility];  Service: Urology;  Laterality: Right;      NEPHROLITHOTOMY  10/08/2012   Procedure: NEPHROLITHOTOMY PERCUTANEOUS;  Surgeon: Sebastian Ache, MD;  Location: [facility];  Service: Urology;  Laterality: Right;  THIRD STAGE, nephrostomy tube exchange x 2   NEPHROLITHOTOMY  10/11/2012   Procedure: NEPHROLITHOTOMY PERCUTANEOUS SECOND LOOK;  Surgeon: Sebastian Ache, MD;  Location: [facility];  Service: Urology;  Laterality: Right;  RIGHT 4 STAGE PERCUTANOUS NEPHROLITHOTOMY, right URETEROSCOPY WITH HOLMIUM LASER    PANNICULECTOMY N/A 12/21/2020   Procedure: PANNICULECTOMY;  Surgeon: Peggye Form, DO;  [facility]: [facility];  Service: Plastics;  Laterality: N/A;   PERCUTANEOUS NEPHROSTOLITHOTOMY  04/2022   PORT-A-CATH REMOVAL N/A 07/16/2020   Procedure: REMOVAL PORT-A-CATH;  Surgeon: Harriette Bouillon, MD;  Location: [facility];  Service: General;  Laterality: N/A;   PORTACATH PLACEMENT Left 05/17/2018   Procedure: INSERTION PORT-A-CATH;  Surgeon: Abigail Miyamoto, MD;  Location: [facility];  Service: General;  Laterality: Left;   RADICAL NECK DISSECTION  04/12/2017   limited/notes 04/12/2017   RADICAL NECK DISSECTION N/A 04/12/2017    Procedure: RADICAL NECK DISSECTION;  Surgeon: Christia Reading, MD;  Location: [facility] OR;  Service: ENT;  Laterality: N/A;  limited neck dissection 2 hours total   REDUCTION MAMMAPLASTY Bilateral 1998   RIGHT/LEFT HEART CATH AND CORONARY ANGIOGRAPHY N/A 03/12/2020   Procedure: RIGHT/LEFT HEART CATH AND CORONARY ANGIOGRAPHY;  Surgeon: Dolores Patty, MD;  [facility];  Service: Cardiovascular;  Laterality: N/A;   [place]  1994   TEE WITHOUT CARDIOVERSION N/A 08/25/2023   Procedure: TRANSESOPHAGEAL ECHOCARDIOGRAM;  Surgeon: [name], MD;  Location: [facility];  Service: Open Heart Surgery;  Laterality: N/A;   THORACIC AORTIC ENDOVASCULAR STENT GRAFT N/A 02/02/2023   Procedure: THORACIC AORTIC ENDOVASCULAR STENT GRAFT;  Surgeon: Victorino Sparrow, MD;  Location: [facility] OR;  Service: Vascular;  Laterality: N/A;   THORACIC AORTIC ENDOVASCULAR STENT GRAFT N/A 08/25/2023   Procedure: THORACIC AORTIC ENDOVASCULAR STENT GRAFT;  Surgeon: Victorino Sparrow, MD;  Location: [facility] OR;  Service: Vascular;  Laterality: N/A;   THYROIDECTOMY  04/12/2017   completion/notes 04/12/2017   THYROIDECTOMY N/A 04/12/2017   Procedure: THYROIDECTOMY;  Surgeon: Christia Reading, MD;  Location: [facility] OR;  Service: ENT;  Laterality: N/A;  Completion Thyroidectomy   TOTAL THYROIDECTOMY  2010   ULTRASOUND GUIDANCE FOR VASCULAR ACCESS Right 08/25/2023   Procedure: ULTRASOUND GUIDANCE FOR VASCULAR ACCESS, RIGHT FEMORAL ARTERY;  Surgeon: Victorino Sparrow, MD;  Location: [facility];  Service: Vascular;  Laterality: Right;    ROS:   General:  No weight loss, Fever, chills  HEENT: No recent headaches, no nasal bleeding, no visual changes, no sore throat  Neurologic: No dizziness, blackouts, seizures. No recent symptoms of stroke or mini- stroke. No recent episodes of slurred speech, or temporary blindness.  Cardiac: No recent episodes of chest pain/pressure, no shortness of breath at rest.  No shortness of breath with exertion.  Denies  history of atrial fibrillation or irregular heartbeat  Vascular: No history of rest pain in feet.  No history of claudication.  No history of non-healing ulcer, No history of DVT   Pulmonary: No home oxygen, no productive cough, no hemoptysis,  No asthma or wheezing  Musculoskeletal:  [ ]  Arthritis, [ x] Low back pain,  [ ]  Joint pain  Hematologic:No history of hypercoagulable state.  No history of easy bleeding.  No history of anemia  Gastrointestinal: No hematochezia or melena,  No gastroesophageal reflux, no trouble swallowing  Urinary: [ ]  chronic Kidney disease, [x ] on HD - [ ]  MWF or [ ]  TTHS, [ ]  Burning with urination, [ ]  Frequent urination, [ ]  Difficulty  urinating;   Skin: No rashes  Psychological: No history of anxiety,  No history of depression  Social History Social History   Tobacco Use   Smoking status: Former    Current packs/day: 0.00    Average packs/day: 0.5 packs/day for 15.0 years (7.5 ttl pk-yrs)    Types: Cigarettes    Start date: 04/12/2017    Quit date: 2019    Years since quitting: 6.1   Smokeless tobacco: Never  Vaping Use   Vaping status: Never Used  Substance Use Topics   Alcohol use: Yes    Alcohol/week: 0.0 standard drinks of alcohol    Comment: social   Drug use: No    Family History Family History  Problem Relation Age of Onset   Hypertension Mother    Cancer Mother        laryngeal   Heart disease Mother        stent   Pancreatic cancer Father    Hypertension Father    Lung cancer Father 90       hx smoking   Hypertension Sister    Hypertension Sister    Hypertension Brother    Breast cancer Maternal Aunt 52   Breast cancer Maternal Aunt        dx 60+   Cancer Maternal Uncle        Lung CA   Breast cancer Paternal Aunt 13   Breast cancer Paternal Aunt        dx 53's   Breast cancer Paternal Aunt        dx 50's   Prostate cancer Paternal Uncle    Prostate cancer Paternal Uncle    Lung cancer Paternal Uncle    Breast  cancer Cousin 62   Breast cancer Cousin        dx <50   Breast cancer Cousin        dx <50   Breast cancer Cousin        dx <50   Heart disease Other    Hypertension Other    Stroke Other        Grandparent   Kidney disease Other        Grandparent   Diabetes Other        FH of Diabetes   Colon cancer Neg Hx    Esophageal cancer Neg Hx    Rectal cancer Neg Hx    Stomach cancer Neg Hx     Allergies  Allergies  Allergen Reactions   Genvoya [Elviteg-Cobic-Emtricit-Tenofaf] Hives   Lisinopril Cough   Aldactone [Spironolactone] Hives   Tegaderm Ag Mesh [Silver] Itching     Current Outpatient Medications  Medication Sig Dispense Refill   traMADol (ULTRAM) 50 MG tablet Take 1 tablet (50 mg total) by mouth every 6 (six) hours as needed. 30 tablet 0   acetaminophen (TYLENOL) 500 MG tablet Take 1,000 mg by mouth every 6 (six) hours as needed for mild pain.     albuterol (VENTOLIN HFA) 108 (90 Base) MCG/ACT inhaler INHALE 2 PUFFS INTO THE LUNGS UP TO EVERY 6HRS AS NEEDED FOR WHEEZING/SHORTNESS OF BREATH 8 each 0   allopurinol (ZYLOPRIM) 100 MG tablet TAKE 1 TABLET BY MOUTH EVERY DAY FOR GOUT PREVENTION 90 tablet 2   amLODipine (NORVASC) 5 MG tablet Take 10 mg by mouth daily.     apixaban (ELIQUIS) 5 MG TABS tablet Take 1 tablet (5 mg total) by mouth 2 (two) times daily. 60 tablet 3   aspirin 81  MG chewable tablet Chew 81 mg by mouth daily.     AURYXIA 1 GM 210 MG(Fe) tablet Take 210 mg by mouth 3 (three) times daily with meals.     Blood Glucose Monitoring Suppl (FREESTYLE LITE) w/Device KIT Inject 1 each into the skin daily. 1 kit 0   carvedilol (COREG) 6.25 MG tablet Take 1 tablet (6.25 mg total) by mouth 2 (two) times daily with a meal. 60 tablet 3   dolutegravir ([name]) 50 MG tablet Take 1 tablet (50 mg total) by mouth daily. 30 tablet 11   emtricitabine-tenofovir AF (DESCOVY) 200-25 MG tablet Take 1 tablet by mouth daily. 30 tablet 11   fluticasone (FLOVENT HFA) 110 MCG/ACT  inhaler Inhale 1 puff into the lungs 2 (two) times daily.     Fluticasone Furoate (ARNUITY ELLIPTA) 100 MCG/ACT AEPB Inhale 1 puff into the lungs daily. 90 each 1   Lancets (ONETOUCH ULTRASOFT) lancets Use as instructed to test blood sugar daily 100 each 5   levocetirizine (XYZAL) 5 MG tablet Take 1 tablet (5 mg total) by mouth every evening. For allergies 90 tablet 3   levothyroxine (SYNTHROID) 175 MCG tablet TAKE 1 TABLET BY MOUTH EVERY DAY 90 tablet 3   losartan (COZAAR) 50 MG tablet Take 1 tablet (50 mg total) by mouth daily. for blood pressure. 90 tablet 3   methocarbamol (ROBAXIN) 500 MG tablet Take 1 tablet (500 mg total) by mouth every 6 (six) hours as needed for muscle spasms. 30 tablet 0   metoCLOPramide (REGLAN) 5 MG tablet Take 1 tablet (5 mg total) by mouth every 6 (six) hours as needed for nausea. 20 tablet 0   minocycline (DYNACIN) 100 MG tablet Take 1 tablet (100 mg total) by mouth 2 (two) times daily. (Patient taking differently: Take 100 mg by mouth daily.) 60 tablet 11   omeprazole (PRILOSEC) 40 MG capsule Take 1 capsule (40 mg total) by mouth 2 (two) times daily before a meal. 180 capsule 0   ondansetron (ZOFRAN) 4 MG tablet Take 1 tablet (4 mg total) by mouth daily as needed for nausea or vomiting. (Patient not taking: Reported on 12/12/2023) 20 tablet 0   oxyCODONE-acetaminophen (PERCOCET/ROXICET) 5-325 MG tablet Take 1-2 tablets by mouth every 8 (eight) hours as needed for severe pain (pain score 7-10). (Patient not taking: Reported on 12/14/2023) 6 tablet 0   rosuvastatin (CRESTOR) 10 MG tablet TAKE 1 TABLET BY MOUTH EVERY DAY FOR CHOLESTEROL 90 tablet 0   saxagliptin HCl (ONGLYZA) 2.5 MG TABS tablet TAKE 1 TABLET (2.5 MG TOTAL) BY MOUTH DAILY. FOR DIABETES. 90 tablet 0   No current facility-administered medications for this visit.    Physical Examination  Vitals:   12/14/23 1348  BP: 95/65  Pulse: 88  Temp: 98.2 F (36.8 C)  SpO2: 99%  Weight: 177 lb (80.3 kg)   Height: 5\' 5"  (1.651 m)    Body mass index is 29.45 kg/m.  General:  Alert and oriented, no acute distress HEENT: Normal Neck: No bruit or JVD Pulmonary: Clear to auscultation bilaterally Cardiac: Regular Rate and Rhythm without murmur Abdomen: Soft, non-tender, non-distended, no mass, no scars Skin: No rash Extremity Pulses:radial,  femoral, dorsalis pedis, pulses bilaterally Musculoskeletal: No deformity or edema  Neurologic: Upper and lower extremity motor grossly intact and symmetric  DATA:  ABI Findings:  +---------+------------------+-----+-----------+--------+  Right   Rt Pressure (mmHg)IndexWaveform   Comment   +---------+------------------+-----+-----------+--------+  Brachial 107                                         +---------+------------------+-----+-----------+--------+  PTA     131               1.22 multiphasic          +---------+------------------+-----+-----------+--------+  DP      126               1.18 multiphasic          +---------+------------------+-----+-----------+--------+  Great Toe90                0.84 Normal               +---------+------------------+-----+-----------+--------+   +---------+------------------+-----+-----------+-------+  Left    Lt Pressure (mmHg)IndexWaveform   Comment  +---------+------------------+-----+-----------+-------+  Brachial 82                                         +---------+------------------+-----+-----------+-------+  PTA     116               1.08 multiphasic         +---------+------------------+-----+-----------+-------+  DP      121               1.13 multiphasic         +---------+------------------+-----+-----------+-------+  Great Toe87                0.81 Normal              +---------+------------------+-----+-----------+-------+    +-------+-----------+-----------+------------+------------+  ABI/TBIToday's ABIToday's TBIPrevious  ABIPrevious TBI  +-------+-----------+-----------+------------+------------+  Right 1.22       0.84       1.20        0.86          +-------+-----------+-----------+------------+------------+  Left  1.13       0.81       1.15        0.85          +-------+-----------+-----------+------------+------------+    Bilateral ABIs and TBIs appear essentially unchanged compared to prior  study on 10/03/2023.    Summary:  Right: Resting right ankle-brachial index is within normal range. The  right toe-brachial index is normal.   Left: Resting left ankle-brachial index is within normal range. The left  toe-brachial index is normal.   Right Findings:  +-----+------------+---------+-----------+----------+-------+  RIGHTCompressiblePhasicitySpontaneousPropertiesSummary  +-----+------------+---------+-----------+----------+-------+  IJV     Full       Yes                                 +-----+------------+---------+-----------+----------+-------+     Left Findings:  +----------+------------+---------+-----------+----------+-------+  LEFT     CompressiblePhasicitySpontaneousPropertiesSummary  +----------+------------+---------+-----------+----------+-------+  IJV          Full       Yes                                 +----------+------------+---------+-----------+----------+-------+  Subclavian   Full       Yes                                 +----------+------------+---------+-----------+----------+-------+  Axillary     Full       Yes                                 +----------+------------+---------+-----------+----------+-------+  Brachial     Full       Yes                                 +----------+------------+---------+-----------+----------+-------+  Radial       Full                                           +----------+------------+---------+-----------+----------+-------+  Ulnar        Full                                            +----------+------------+---------+-----------+----------+-------+  Cephalic     Full                                           +----------+------------+---------+-----------+----------+-------+  Basilic      Full                                           +----------+------------+---------+-----------+----------+-------+     Summary:    Right:  No evidence of thrombosis in the internal jugular vein.    Left:  No evidence of acute deep vein thrombosis in the upper extremity. No  evidence of  acute superficial vein thrombosis in the upper extremity.    -----------------+-------------+----------+--------------+  Right Cephalic   Diameter (cm)Depth (cm)   Findings     +-----------------+-------------+----------+--------------+  Shoulder            0.43        1.00                   +-----------------+-------------+----------+--------------+  Prox upper arm       0.40        0.70                   +-----------------+-------------+----------+--------------+  Mid upper arm        0.46        0.70                   +-----------------+-------------+----------+--------------+  Dist upper arm       0.37        0.23     branching     +-----------------+-------------+----------+--------------+  Antecubital fossa                       not visualized  +-----------------+-------------+----------+--------------+  Prox forearm                            not visualized  +-----------------+-------------+----------+--------------+  Mid forearm                             not visualized  +-----------------+-------------+----------+--------------+  Dist forearm                            not visualized  +-----------------+-------------+----------+--------------+  Wrist                                  not visualized  +-----------------+-------------+----------+--------------+    +-----------------+-------------+----------+---------+  Right Basilic    Diameter (cm)Depth (cm)Findings   +-----------------+-------------+----------+---------+  Prox upper arm       0.50        2.00              +-----------------+-------------+----------+---------+  Mid upper arm        0.50        1.80              +-----------------+-------------+----------+---------+  Dist upper arm       0.42        1.40              +-----------------+-------------+----------+---------+  Antecubital fossa    0.35        1.20   branching  +-----------------+-------------+----------+---------+  Prox forearm         0.30        0.08              +-----------------+-------------+----------+---------+  Mid forearm          0.30        0.10              +-----------------+-------------+----------+---------+  Distal forearm       0.25        0.13              +-----------------+-------------+----------+---------+  Wrist               0.25        1.00              +-----------------+-------------+----------+---------+   +-----------------+-------------+----------+---------+  Left Cephalic    Diameter (cm)Depth (cm)Findings   +-----------------+-------------+----------+---------+  Shoulder            0.44        1.10   branching  +-----------------+-------------+----------+---------+  Prox upper arm       0.50        0.63   branching  +-----------------+-------------+----------+---------+  Mid upper arm        0.52        0.58              +-----------------+-------------+----------+---------+  Dist upper arm       0.39        0.33              +-----------------+-------------+----------+---------+  Antecubital fossa    0.25        0.67              +-----------------+-------------+----------+---------+  Prox forearm         0.30        0.40              +-----------------+-------------+----------+---------+  Mid  forearm          0.33        0.30              +-----------------+-------------+----------+---------+  Dist forearm         0.21        0.30              +-----------------+-------------+----------+---------+  Wrist               0.20  0.28              +-----------------+-------------+----------+---------+   +-----------------+-------------+----------+--------+  Left Basilic     Diameter (cm)Depth (cm)Findings  +-----------------+-------------+----------+--------+  Prox upper arm       0.37        2.00             +-----------------+-------------+----------+--------+  Mid upper arm        0.46        2.40             +-----------------+-------------+----------+--------+  Dist upper arm       0.42        1.50             +-----------------+-------------+----------+--------+  Antecubital fossa    0.35        0.44             +-----------------+-------------+----------+--------+  Prox forearm         0.32        0.28             +-----------------+-------------+----------+--------+  Mid forearm          0.24        0.12             +-----------------+-------------+----------+--------+  Distal forearm       0.21        0.11             +-----------------+-------------+----------+--------+  Wrist               0.14        0.18             +-----------------+-------------+----------+--------+    ASSESSMENT/PLAN:   Patient is a 58 y.o. year old female who presents for evaluation of  To be considered for PD.  She is also here for ABI's to f/u after  arch to branching with Dr. Karin Lieu and Dr. 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  Postop course complicated by left upper extremity DVT for which Eliquis was started.   No evidence of DVT in the left UE on duplex today.     She has completed her approval process for PD catheter placement by Nephrology Dr. Thedore Mins.  Her LE have  excellent inflow with Multiphasic wave forms and palpable pedal pulses.   The vein mapping shows she has acceptable veins for future needs if she has to have HD in the future.    She may need to follow up with her PCP and be considered for spine consult if her lumbar pain continues.  It does not seem to have a radicular component.  She does seen deconditioned from these events and may just need time to get stronger.    She will be scheduled for PD catheter placement with Dr. Lenell Antu.  HD is MWF.  I discussed this with Dr. Karin Lieu in clinic     Mosetta Pigeon PA-C [facility] Office: [phone]  MD in clinic [place]

## 2023-12-15 ENCOUNTER — Other Ambulatory Visit: Payer: Self-pay

## 2023-12-15 DIAGNOSIS — E1122 Type 2 diabetes mellitus with diabetic chronic kidney disease: Secondary | ICD-10-CM | POA: Diagnosis not present

## 2023-12-15 DIAGNOSIS — Z992 Dependence on renal dialysis: Secondary | ICD-10-CM | POA: Diagnosis not present

## 2023-12-15 DIAGNOSIS — N186 End stage renal disease: Secondary | ICD-10-CM

## 2023-12-15 DIAGNOSIS — N2581 Secondary hyperparathyroidism of renal origin: Secondary | ICD-10-CM | POA: Diagnosis not present

## 2023-12-18 ENCOUNTER — Encounter (HOSPITAL_COMMUNITY): Payer: Self-pay | Admitting: Vascular Surgery

## 2023-12-18 DIAGNOSIS — N2581 Secondary hyperparathyroidism of renal origin: Secondary | ICD-10-CM | POA: Diagnosis not present

## 2023-12-18 DIAGNOSIS — Z992 Dependence on renal dialysis: Secondary | ICD-10-CM | POA: Diagnosis not present

## 2023-12-18 DIAGNOSIS — N186 End stage renal disease: Secondary | ICD-10-CM | POA: Diagnosis not present

## 2023-12-18 NOTE — Progress Notes (Incomplete)
 PCP - Vernona Rieger, NP Cardiologist - none  Chest x-ray - 09/27/23 EKG - 09/15/23 Stress Test - 11/03/17 Transesophageal ECHO - 08/25/23 Cardiac Cath - 03/12/20  ICD Pacemaker/Loop - n/a  Sleep Study -  n/a  Diabetes Type 2 Fasting Blood Sugar - 100s-160s Checks Blood Sugar ___3__ times a day  Do not take Saxagliptin on the DOS.     If your blood sugar is less than 70 mg/dL, you will need to treat for low blood sugar: Treat a low blood sugar (less than 70 mg/dL) with  cup of clear juice (cranberry or apple), 4 glucose tablets, OR glucose gel. Recheck blood sugar in 15 minutes after treatment (to make sure it is greater than 70 mg/dL). If your blood sugar is not greater than 70 mg/dL on recheck, call [phone] for further instructions.  Blood Thinner Instructions:  n/a  Aspirin Instructions:  Follow your surgeon's instructions on when to stop aspirin prior to surgery,  If no instructions were given by your surgeon then you will need to call the office for those instructions.  Patient agrees to call MD for ASA instructions.  MD's office number given to patient.  NPO   Anesthesia review: Yes  STOP now taking any Aspirin (unless otherwise instructed by your surgeon), Aleve, Naproxen, Ibuprofen, Motrin, Advil, Goody's, BC's, all herbal medications, fish oil, and all vitamins.   Coronavirus Screening Do you have any of the following symptoms:  Cough yes/no: No Fever (>100.4F)  yes/no: No Runny nose yes/no: No Sore throat yes/no: No Difficulty breathing/shortness of breath  yes/no: No  Have you traveled in the last 14 days and where? yes/no: No  Patient verbalized understanding of instructions that were given via phone.

## 2023-12-19 ENCOUNTER — Encounter (HOSPITAL_COMMUNITY): Payer: Self-pay | Admitting: Vascular Surgery

## 2023-12-19 ENCOUNTER — Other Ambulatory Visit: Payer: Self-pay

## 2023-12-19 ENCOUNTER — Ambulatory Visit: Payer: Self-pay

## 2023-12-19 NOTE — Patient Outreach (Signed)
 Care Coordination   12/19/2023 Name: [name] MRN: [id number] DOB: 06/04/1966   Care Coordination Outreach Attempts:  Successful telephone outreach attempt to patient. Patient currently in the car.  Patient states she forgot RN case manager was scheduled to call today. Agreeable to rescheduling initial telephone outreach appointment with RN case manager.   Follow Up Plan:  Additional outreach attempts will be made to offer the patient complex care management information and services.   Encounter Outcome:  Patient Request to Call Back  Rescheduled for 12/26/23 at 11 am.    Care Coordination Interventions:  No, not indicated    George Ina RN, BSN, CCM [place]  [facility], Population Health Case Manager Phone: [phone]

## 2023-12-19 NOTE — Progress Notes (Signed)
 Anesthesia Chart Review: Maury Dus  Case: [id number] Date/Time: 12/21/23 1110   Procedure: INSERTION, CATHETER, CAPD, LAPAROSCOPIC   Anesthesia type: Monitor Anesthesia Care   Pre-op diagnosis: ESRD   [facility] ROOM 16 / [facility]   Surgeons: [name], MD       DISCUSSION: Patient is a 58 year old female scheduled for the above procedure. She was referred to [facility] by nephrologist Dr. Thedore Mins for consideration of PD catheter placement. As of 12/14/23, she had a working right internal jugular TDC.   History includes former smoker (quit 10/17/17), type B aortic dissection (s/p TEVAR 02/02/23; s/p median sternotomy for aortic arch debranching with aorta-innominate artery,  aorta-left carotid artery and aorta-left subclavian artery bypasses, TEVAR 08/25/23, complicated by acute DVT left internal jugular, left brachial, left, axillary, and left subclavian veins 08/29/23), HTN, HLD, DM2, ESRD (started HD 01/31/23; right internal jugular TDC 01/30/23; HD MWF), HIV, Graves disease' (s/p PTU, thyroidectomy 09/15/08;  01/12/17 left cervical LN biopsy favored metastatic papillary thyroid carcinoma; s/p completion left thyroidectomy with limited left neck dissection 04/12/17 with benign pathology), breast cancer (Stage 1B right breast, s/p lumpectomy 10/03/18, chemoradiation; Port-a-cath 05/17/18-07/16/20), anemia, GERD, asthma, hidradenitis suppurativa (s/p I&D right breast/axilla 01/31/12), 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 Normal coronaries in May 2021.   She had normal coronaries by [facility] on 03/12/20. 01/30/23 echo showed LVEF 55-60%, no regional wall motion abnormalities, mild concentric LVH, grade II diastolic dysfunction, normal RV systolic function, mild-moderate MR, abdominal aorta flap also noted concerning for dissection (s/p TEVAR 02/02/23). Left BBB noted on 07/11/23 EKG. "BBB" was noted on telemetry strips documentation from 01/23/23.  .   She is a same day workup, so lab and anesthesia team evaluation on the day of  surgery.   VS: Ht 5\' 5"  (1.651 m)   Wt 80.3 kg   LMP 03/31/2014 (LMP Unknown)   BMI 29.45 kg/m  BP Readings from Last 3 Encounters:  12/14/23 95/65  10/19/23 (!) 147/89  09/27/23 (!) 153/86   Pulse Readings from Last 3 Encounters:  12/14/23 88  09/27/23 86  09/26/23 88     PROVIDERS: Doreene Nest, NP is PCP  - Gerarda Fraction, MD is vascular surgeon - Evelene Croon, MD is CT surgeon. As needed follow-up at 09/27/23 visit.  - She was previously followed by HF cardiologist Arvilla Meres, MD at the [facility], last visit 08/31/21 for EF monitoring during chemotherapy.  EF 55-60% by 02/2020 echo. Normal coronaries in 2021. As needed follow-up recommended.  Dorothy Puffer, MD is RAD-ONC Rachel Moulds, MD is HEM-ONC - Cliffton Asters, MD is ID - Zetta Bills, MD or Anthony Sar, MD are her nephrologists - Romero Belling, MD is endocrinologist. Last visit 11/02/21.    LABS: For day of surgery. Per labs at [facility] (see CE), H/H 11.4/34.2 on 12/13/23 and A1c 5.6% on 09/06/23.   IMAGES: CXR 09/27/23: FINDINGS: Stable cardiomediastinal silhouette. Stable stent graft seen involving descending thoracic aorta. Right internal jugular dialysis catheter is unchanged. Mild right basilar subsegmental atelectasis is noted. Minimal left basilar scarring or subsegmental atelectasis is noted. Bony thorax is unremarkable. IMPRESSION: Mild right basilar subsegmental atelectasis is noted. Minimal left basilar scarring or subsegmental atelectasis is noted.   CTA Chest/abd/pelvis 09/15/23: IMPRESSION: 1. Interval placement of a second thoracic aortic graft. Previously identified ulceration along the inferior margin of the aortic arch is no longer seen. 2. 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 3. Stable dissection of the thoracic and abdominal aorta.  Dissection extends into the left common iliac artery and left external iliac artery, unchanged. 4.  New rounded hypodensity in the tail of the pancreas which is too small to characterize. Recommend follow-up MRI in 6 months. 5. Nonenlarged mediastinal lymph nodes have increased in size and number. 6. Nonobstructing right renal calculi. 7. New sternotomy with mild amount of underlying mediastinal edema. No drainable fluid collection. 8. Left chest wall edema.     EKG: EKG 09/15/23: Sinus rhythm Probable left atrial enlargement Left bundle branch block Confirmed by Benjiman Core [phone]) on 09/15/2023 5:41:54 PM  EKG 08/23/23: Normal sinus rhythm ST & Marked T wave abnormality, consider anterolateral ischemia Abnormal ECG   EKG 07/11/23: Normal sinus rhythm Left axis deviation Left bundle branch block Abnormal ECG When compared with ECG of 05-Feb-2023 08:28, LBBB now seen Confirmed by Alverda Skeans ([id number]) on 07/11/2023 3:47:27 PM      CV: ABIs 12/14/23: Summary:  - Right: Resting right ankle-brachial index is within normal range. The  right toe-brachial index is normal.  - Left: Resting left ankle-brachial index is within normal range. The left  toe-brachial index is normal.    UE Venous US 12/14/23: Summary:  Right:  No evidence of thrombosis in the internal jugular vein.  Left:  No evidence of acute deep vein thrombosis in the upper extremity. No  evidence of  acute superficial vein thrombosis in the upper extremity.      US Carotid 07/11/23: Summary:  - Right Carotid: Velocities in the right ICA are consistent with a 1-39% stenosis.  - Left Carotid: Velocities in the left ICA are consistent with a 1-39% stenosis.  - Vertebrals: Bilateral vertebral arteries demonstrate antegrade flow.  - Subclavians: Normal flow hemodynamics were seen in bilateral subclavian arteries.      Echo 01/30/23: IMPRESSIONS   1. Left ventricular ejection fraction, by estimation, is 55 to 60%. The  left ventricle has normal function. The left ventricle has no regional  wall motion  abnormalities. There is mild concentric left ventricular  hypertrophy. Left ventricular diastolic  parameters are consistent with Grade II diastolic dysfunction  (pseudonormalization).   2. Right ventricular systolic function is normal. The right ventricular  size is normal. Tricuspid regurgitation signal is inadequate for assessing  PA pressure.   3. Left atrial size was mildly dilated.   4. The mitral valve is normal in structure. Mild to moderate mitral valve  regurgitation. No evidence of mitral stenosis.   5. The aortic valve is calcified. Aortic valve regurgitation is not  visualized. Aortic valve sclerosis/calcification is present, without any  evidence of aortic stenosis.   6. There appears a flap in the abdominal aorta concerning for abdominal  aortic dissection - would recommend further testing with Abdominal CTA or  MRA.   7. The inferior vena cava is dilated in size with <50% respiratory  variability, suggesting right atrial pressure of 15 mmHg.      RHC/LHC 03/12/20: Findings: Ao = 175/101 (131) LV = 175/11 RA = 6 RV = 40/11 PA = 41/12 (26) PCW = 10 Fick cardiac output/index = 5.1/2.4 PVR = 3.2 WU Ao sat = 96% PA sat = 64%, 65%   Assessment: 1. Normal coronary arteries 2. LVEF 60-65% 3. Mild PAH likely due to OSA/OHS 4. Otherwise normal hemodynamics 4. Severe systemic HTN   Plan/Discussion: Medical therapy with focus on HTN control and weight loss.   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  Genes Analyzed (43 total): APC*, ATM*, AXIN2, BARD1, BMPR1A, BRCA1*, BRCA2*, BRIP1*, CDH1*, CDK4, CDKN2A, CHEK2*, DICER1, GALNT12, HOXB13, MEN1, MLH1*, MRE11A, MSH2*, MSH3, MSH6*, MUTYH*, NBN, NF1*, NTHL1, PALB2*, PMS2*, POLD1, POLE, PTEN*, RAD50, RAD51C*, RAD51D*, RET, SDHB, SDHD, SMAD4, SMARCA4, STK11 and TP53* (sequencing and deletion/duplication); EGFR (s   GERD 07/24/2006   GRAVE'S DISEASE 01/01/2008   History of hidradenitis suppurativa    History of kidney stones    History of thrush    HIV DISEASE 07/24/2006   dx March 05   Hyperlipidemia    HYPERTENSION 07/24/2006   Hyperthyroidism 08/2006   Grave's Disease -diffuse radiotracer uptake 08/25/06 Thyroid scan-Cold nodule to R lower lobe of thyrorid   Ileus (HCC) 02/14/2023   Menometrorrhagia    hx of   Nephrolithiasis    Panniculitis 05/12/2020   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HCC)    METASTATIC PAPILLARY THYROID CARCINOMA per 01/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 Completed 2 yrs Prednisone after lung bx confirmation. No symptoms since then.   Sebaceous cyst 04/21/2020   Suppurative hidradenitis    Thyroid cancer (HCC)    THYROID NODULE, RIGHT  02/08/2007    Past Surgical History:  Procedure Laterality Date   AORTIC ARCH DEBRANCHING N/A 08/25/2023   Procedure: AORTIC ARCH DEBRANCHING USING 12X8X8MM HEMASHIELD GRAFT;  Surgeon: [name], MD;  [facility];  Service: Open Heart Surgery;  Laterality: N/A;  with bypasses to the innominate and left common carotid arteries   APPLICATION OF WOUND VAC N/A 01/20/2021   Procedure: APPLICATION OF WOUND VAC;  Surgeon: Peggye Form, DO;  Location: [facility];  Service: Plastics;  Laterality: N/A;   BREAST EXCISIONAL BIOPSY Right 04/26/2018   right axilla negative   BREAST EXCISIONAL BIOPSY Left 04/26/2018   left axilla negative   BREAST LUMPECTOMY Right 10/03/2018   malignant   BREAST LUMPECTOMY WITH RADIOACTIVE SEED AND SENTINEL LYMPH NODE BIOPSY Right 10/03/2018   Procedure: RIGHT BREAST LUMPECTOMY WITH RADIOACTIVE SEED AND SENTINEL LYMPH NODE MAPPING;  Surgeon: Harriette Bouillon, MD;  [facility];  Service: General;  Laterality: Right;   BREAST SURGERY  1997   Breast Reduction    CYSTOSCOPY W/ URETERAL STENT REMOVAL  11/09/2012   Procedure: CYSTOSCOPY WITH STENT REMOVAL;  Surgeon: Sebastian Ache, MD;  Location: [facility];  Service: Urology;  Laterality: Right;   CYSTOSCOPY WITH RETROGRADE PYELOGRAM, URETEROSCOPY AND STENT PLACEMENT  11/09/2012   Procedure: CYSTOSCOPY WITH RETROGRADE PYELOGRAM, URETEROSCOPY AND STENT PLACEMENT;  Surgeon: Sebastian Ache, MD;  [facility]: [facility];  Service: Urology;  Laterality: Left;  LEFT URETEROSCOPY, STONE MANIPULATION, left STENT exchange    CYSTOSCOPY WITH STENT PLACEMENT  10/02/2012   Procedure: CYSTOSCOPY WITH STENT PLACEMENT;  Surgeon: Sebastian Ache, MD;  [facility]: [facility];  Service: Urology;  Laterality: Left;   DEBRIDEMENT AND CLOSURE WOUND N/A 01/20/2021   Procedure: Excision of abdominal wound with closure;  Surgeon: Peggye Form, DO;  Location: [facility];  Service: Plastics;  Laterality: N/A;    DILATION AND CURETTAGE OF UTERUS  11/2002   s/p for 1st trimester nonviable pregnancy   EYE SURGERY     sty under eyelid   INCISE AND DRAIN ABCESS  08/2002   s/p I &D for righ inframmary fold hidradenitis   INCISION AND DRAINAGE PERITONSILLAR ABSCESS  12/2001   IR CV LINE INJECTION  06/07/2018   IR FLUORO GUIDE CV LINE RIGHT  01/30/2023   IR IMAGING GUIDED PORT INSERTION  06/20/2018   IR REMOVAL TUN ACCESS W/ PORT W/O FL MOD SED  06/20/2018   IR US GUIDE VASC ACCESS RIGHT  01/30/2023   IRRIGATION AND DEBRIDEMENT ABSCESS  01/31/2012   Procedure: IRRIGATION AND DEBRIDEMENT ABSCESS;  Surgeon: Kandis Cocking, MD;  [facility];  Service: General;  Laterality: Right;  right breast and axilla    NEPHROLITHOTOMY  10/02/2012   Procedure: NEPHROLITHOTOMY PERCUTANEOUS;  Surgeon: Sebastian Ache, MD;  [facility];  Service: Urology;  Laterality: Right;  First Stage Percutaneous Nephrolithotomy with Surgeon Access, Left Ureteral Stent     NEPHROLITHOTOMY  10/04/2012   Procedure: NEPHROLITHOTOMY PERCUTANEOUS SECOND LOOK;  Surgeon: Sebastian Ache, MD;  [facility];  Service: Urology;  Laterality: Right;      NEPHROLITHOTOMY  10/08/2012   Procedure: NEPHROLITHOTOMY PERCUTANEOUS;  Surgeon: Sebastian Ache, MD;  [facility];  Service: Urology;  Laterality: Right;  THIRD STAGE, nephrostomy tube exchange x 2   NEPHROLITHOTOMY  10/11/2012   Procedure: NEPHROLITHOTOMY PERCUTANEOUS SECOND LOOK;  Surgeon: Sebastian Ache, MD;  [facility];  Service: Urology;  Laterality: Right;  RIGHT 4 STAGE PERCUTANOUS NEPHROLITHOTOMY, right URETEROSCOPY WITH HOLMIUM LASER    PANNICULECTOMY N/A 12/21/2020   Procedure: PANNICULECTOMY;  Surgeon: Peggye Form, DO;  [facility];  Service: Plastics;  Laterality: N/A;   PERCUTANEOUS NEPHROSTOLITHOTOMY  04/2022   PORT-A-CATH REMOVAL N/A 07/16/2020   Procedure: REMOVAL PORT-A-CATH;  Surgeon: Harriette Bouillon, MD;  Location: [facility];   Service: General;  Laterality: N/A;   PORTACATH PLACEMENT Left 05/17/2018   Procedure: INSERTION PORT-A-CATH;  Surgeon: Abigail Miyamoto, MD;  Location: [facility];  Service: General;  Laterality: Left;   RADICAL NECK DISSECTION  04/12/2017   limited/notes 04/12/2017   RADICAL NECK DISSECTION N/A 04/12/2017   Procedure: RADICAL NECK DISSECTION;  Surgeon: Christia Reading, MD;  Location: [facility] OR;  Service: ENT;  Laterality: N/A;  limited neck dissection 2 hours total   REDUCTION MAMMAPLASTY Bilateral 1998   RIGHT/LEFT HEART CATH AND CORONARY ANGIOGRAPHY N/A 03/12/2020   Procedure: RIGHT/LEFT HEART CATH AND CORONARY ANGIOGRAPHY;  Surgeon: Dolores Patty, MD;  [facility];  Service: Cardiovascular;  Laterality: N/A;   [place]  1994   TEE WITHOUT CARDIOVERSION N/A 08/25/2023   Procedure: TRANSESOPHAGEAL ECHOCARDIOGRAM;  Surgeon: [name], MD;  Location[facility];  Service: Open Heart Surgery;  Laterality: N/A;   THORACIC AORTIC ENDOVASCULAR STENT GRAFT N/A 02/02/2023   Procedure: THORACIC AORTIC ENDOVASCULAR STENT GRAFT;  Surgeon: Victorino Sparrow, MD;  Location: [facility] OR;  Service: Vascular;  Laterality: N/A;   THORACIC AORTIC ENDOVASCULAR STENT GRAFT N/A 08/25/2023   Procedure: THORACIC AORTIC ENDOVASCULAR STENT GRAFT;  Surgeon: Victorino Sparrow, MD;  Location: [facility];  Service: Vascular;  Laterality: N/A;   THYROIDECTOMY  04/12/2017   completion/notes 04/12/2017   THYROIDECTOMY N/A 04/12/2017   Procedure: THYROIDECTOMY;  Surgeon: Christia Reading, MD;  Location: [facility] OR;  Service: ENT;  Laterality: N/A;  Completion Thyroidectomy   TOTAL THYROIDECTOMY  2010   ULTRASOUND GUIDANCE FOR VASCULAR ACCESS Right 08/25/2023   Procedure: ULTRASOUND GUIDANCE FOR VASCULAR ACCESS, RIGHT FEMORAL ARTERY;  Surgeon: Victorino Sparrow, MD;  Location: [facility] OR;  Service: Vascular;  Laterality: Right;    MEDICATIONS: No current facility-administered medications for this encounter.     acetaminophen (TYLENOL) 500 MG tablet   albuterol (VENTOLIN HFA) 108 (90 Base) MCG/ACT inhaler   allopurinol (ZYLOPRIM) 100 MG tablet   amLODipine (NORVASC) 10 MG tablet   aspirin EC 81 MG tablet   AURYXIA 1 GM 210 MG(Fe) tablet   carvedilol (COREG) 12.5 MG tablet   dolutegravir ([name]) 50 MG tablet   emtricitabine-tenofovir AF (DESCOVY) 200-25 MG tablet   fluticasone (FLOVENT HFA) 110 MCG/ACT inhaler   Fluticasone Furoate (ARNUITY ELLIPTA) 100 MCG/ACT AEPB   levocetirizine (XYZAL) 5 MG tablet   levothyroxine (SYNTHROID) 175 MCG tablet   losartan (COZAAR) 50 MG tablet   minocycline (DYNACIN) 100 MG tablet   rosuvastatin (CRESTOR) 10 MG tablet   saxagliptin HCl (ONGLYZA) 2.5 MG TABS tablet   traMADol (ULTRAM) 50 MG tablet   Blood Glucose Monitoring Suppl (FREESTYLE LITE) w/Device KIT   Lancets (ONETOUCH ULTRASOFT) lancets    Shonna Chock, PA-C Surgical Short Stay/Anesthesiology [facility] Phone [phone] [facility] Phone [phone] 12/19/2023 12:40 PM

## 2023-12-19 NOTE — Anesthesia Preprocedure Evaluation (Signed)
 Anesthesia Evaluation  Patient identified by MRN, date of birth, ID band Patient awake    Reviewed: Allergy & Precautions, NPO status , Patient's Chart, lab work & pertinent test results  History of Anesthesia Complications Negative for: history of anesthetic complications  Airway Mallampati: III  TM Distance: >3 FB Neck ROM: Full   Comment: Previous grade I view with Glidescope 3 Dental  (+) Dental Advisory Given   Pulmonary neg shortness of breath, asthma (no recent flares, last inhaler use ~1 month ago) , neg sleep apnea, neg COPD, neg recent URI, former smoker Sarcoidosis    Pulmonary exam normal breath sounds clear to auscultation       Cardiovascular hypertension (amlodipine, carvedilol, losartan), Pt. on medications (-) angina + Peripheral Vascular Disease and + DVT  (-) Past MI, (-) Cardiac Stents and (-) CABG + dysrhythmias (LBBB)  Rhythm:Regular Rate:Normal  HLD, h/o dissecting AAA s/p repair   Neuro/Psych  PSYCHIATRIC DISORDERS Anxiety Depression    negative neurological ROS     GI/Hepatic Neg liver ROS,GERD  ,,  Endo/Other  diabetes, Type 2Hypothyroidism  H/o thyroid cancer s/p thyroidectomy  Renal/GU ESRF and DialysisRenal disease     Musculoskeletal  (+) Arthritis ,    Abdominal   Peds  Hematology  (+) Blood dyscrasia, anemia , HIV (on HAART)  Anesthesia Other Findings   Reproductive/Obstetrics H/o right breast cancer                             Anesthesia Physical Anesthesia Plan  ASA: 3  Anesthesia Plan: General   Post-op Pain Management:    Induction: Intravenous  PONV Risk Score and Plan: 3 and Ondansetron, Dexamethasone and Treatment may vary due to age or medical condition  Airway Management Planned: Oral ETT  Additional Equipment:   Intra-op Plan:   Post-operative Plan: Extubation in OR  Informed Consent: I have reviewed the patients History and  Physical, chart, labs and discussed the procedure including the risks, benefits and alternatives for the proposed anesthesia with the patient or authorized representative who has indicated his/her understanding and acceptance.     Dental advisory given  Plan Discussed with: CRNA and Anesthesiologist  Anesthesia Plan Comments: (PAT note written 12/19/2023 by Shonna Chock, PA-C.  Risks of general anesthesia discussed including, but not limited to, sore throat, hoarse voice, chipped/damaged teeth, injury to vocal cords, nausea and vomiting, allergic reactions, lung infection, heart attack, stroke, and death. All questions answered.   )       Anesthesia Quick Evaluation

## 2023-12-20 DIAGNOSIS — N186 End stage renal disease: Secondary | ICD-10-CM | POA: Diagnosis not present

## 2023-12-20 DIAGNOSIS — N2581 Secondary hyperparathyroidism of renal origin: Secondary | ICD-10-CM | POA: Diagnosis not present

## 2023-12-20 DIAGNOSIS — Z992 Dependence on renal dialysis: Secondary | ICD-10-CM | POA: Diagnosis not present

## 2023-12-20 NOTE — Progress Notes (Signed)
 VM left for pt with new arrival time. Pt's daughter reached and made aware of new arrival time at 54 for surgery.

## 2023-12-21 ENCOUNTER — Encounter (HOSPITAL_COMMUNITY): Payer: Self-pay | Admitting: Vascular Surgery

## 2023-12-21 ENCOUNTER — Other Ambulatory Visit: Payer: Self-pay

## 2023-12-21 ENCOUNTER — Ambulatory Visit (HOSPITAL_COMMUNITY): Payer: Self-pay | Admitting: Physician Assistant

## 2023-12-21 ENCOUNTER — Ambulatory Visit (HOSPITAL_COMMUNITY)
Admission: RE | Admit: 2023-12-21 | Discharge: 2023-12-21 | Disposition: A | Discharge status: Home / Self Care | Payer: Medicare HMO | Attending: Vascular Surgery | Admitting: Vascular Surgery

## 2023-12-21 ENCOUNTER — Ambulatory Visit (HOSPITAL_BASED_OUTPATIENT_CLINIC_OR_DEPARTMENT_OTHER): Payer: Self-pay | Admitting: Physician Assistant

## 2023-12-21 ENCOUNTER — Other Ambulatory Visit (HOSPITAL_COMMUNITY): Payer: Self-pay

## 2023-12-21 ENCOUNTER — Encounter (HOSPITAL_COMMUNITY)
Admission: RE | Disposition: A | Discharge status: Home / Self Care | Payer: Self-pay | Source: Home / Self Care | Attending: Vascular Surgery

## 2023-12-21 DIAGNOSIS — N186 End stage renal disease: Secondary | ICD-10-CM | POA: Diagnosis not present

## 2023-12-21 DIAGNOSIS — E1122 Type 2 diabetes mellitus with diabetic chronic kidney disease: Secondary | ICD-10-CM | POA: Insufficient documentation

## 2023-12-21 DIAGNOSIS — N185 Chronic kidney disease, stage 5: Secondary | ICD-10-CM | POA: Diagnosis not present

## 2023-12-21 DIAGNOSIS — Z923 Personal history of irradiation: Secondary | ICD-10-CM | POA: Insufficient documentation

## 2023-12-21 DIAGNOSIS — E1151 Type 2 diabetes mellitus with diabetic peripheral angiopathy without gangrene: Secondary | ICD-10-CM | POA: Diagnosis not present

## 2023-12-21 DIAGNOSIS — Z7984 Long term (current) use of oral hypoglycemic drugs: Secondary | ICD-10-CM | POA: Insufficient documentation

## 2023-12-21 DIAGNOSIS — Z87891 Personal history of nicotine dependence: Secondary | ICD-10-CM

## 2023-12-21 DIAGNOSIS — Z85850 Personal history of malignant neoplasm of thyroid: Secondary | ICD-10-CM | POA: Insufficient documentation

## 2023-12-21 DIAGNOSIS — E890 Postprocedural hypothyroidism: Secondary | ICD-10-CM | POA: Diagnosis not present

## 2023-12-21 DIAGNOSIS — Z9221 Personal history of antineoplastic chemotherapy: Secondary | ICD-10-CM | POA: Insufficient documentation

## 2023-12-21 DIAGNOSIS — E785 Hyperlipidemia, unspecified: Secondary | ICD-10-CM | POA: Insufficient documentation

## 2023-12-21 DIAGNOSIS — Z86718 Personal history of other venous thrombosis and embolism: Secondary | ICD-10-CM | POA: Insufficient documentation

## 2023-12-21 DIAGNOSIS — I120 Hypertensive chronic kidney disease with stage 5 chronic kidney disease or end stage renal disease: Secondary | ICD-10-CM

## 2023-12-21 DIAGNOSIS — Z992 Dependence on renal dialysis: Secondary | ICD-10-CM | POA: Diagnosis not present

## 2023-12-21 DIAGNOSIS — Z7982 Long term (current) use of aspirin: Secondary | ICD-10-CM | POA: Diagnosis not present

## 2023-12-21 DIAGNOSIS — Z79899 Other long term (current) drug therapy: Secondary | ICD-10-CM | POA: Diagnosis not present

## 2023-12-21 HISTORY — DX: Left bundle-branch block, unspecified: I44.7

## 2023-12-21 HISTORY — PX: CAPD INSERTION: SHX5233

## 2023-12-21 LAB — GLUCOSE, CAPILLARY
Glucose-Capillary: 80 mg/dL (ref 70–99)
Glucose-Capillary: 72 mg/dL (ref 70–99)

## 2023-12-21 LAB — POCT I-STAT, CHEM 8
BUN: 47 mg/dL — ABNORMAL HIGH (ref 6–20)
Calcium, Ion: 1.12 mmol/L — ABNORMAL LOW (ref 1.15–1.40)
Chloride: 97 mmol/L — ABNORMAL LOW (ref 98–111)
Creatinine, Ser: 6.4 mg/dL — ABNORMAL HIGH (ref 0.44–1.00)
Glucose, Bld: 96 mg/dL (ref 70–99)
HCT: 39 % (ref 36.0–46.0)
Hemoglobin: 13.3 g/dL (ref 12.0–15.0)
Potassium: 4.6 mmol/L (ref 3.5–5.1)
Sodium: 135 mmol/L (ref 135–145)
TCO2: 29 mmol/L (ref 22–32)

## 2023-12-21 SURGERY — LAPAROSCOPIC INSERTION CONTINUOUS AMBULATORY PERITONEAL DIALYSIS  (CAPD) CATHETER
Anesthesia: General

## 2023-12-21 MED ORDER — BUPIVACAINE-EPINEPHRINE (PF) 0.25% -1:200000 IJ SOLN
INTRAMUSCULAR | Status: AC
  Filled 2023-12-21: qty 30

## 2023-12-21 MED ORDER — CHLORHEXIDINE GLUCONATE 4 % EX SOLN: 60.0000 mL | Freq: Once | CUTANEOUS | Status: DC

## 2023-12-21 MED ORDER — 0.9 % SODIUM CHLORIDE (POUR BTL) OPTIME
TOPICAL | Status: DC | PRN
Start: 2023-12-21 — End: 2023-12-21
  Administered 2023-12-21: 1000 mL

## 2023-12-21 MED ORDER — PROPOFOL 10 MG/ML IV BOLUS
INTRAVENOUS | Status: DC | PRN
  Administered 2023-12-21: 200 mg via INTRAVENOUS

## 2023-12-21 MED ORDER — CHLORHEXIDINE GLUCONATE 0.12 % MT SOLN: 15.0000 mL | Freq: Once | OROMUCOSAL | Status: AC

## 2023-12-21 MED ORDER — PHENYLEPHRINE 80 MCG/ML (10ML) SYRINGE FOR IV PUSH (FOR BLOOD PRESSURE SUPPORT)
PREFILLED_SYRINGE | INTRAVENOUS | Status: DC | PRN
  Administered 2023-12-21: 120 ug via INTRAVENOUS
  Administered 2023-12-21: 160 ug via INTRAVENOUS
  Administered 2023-12-21 (×2): 80 ug via INTRAVENOUS
  Administered 2023-12-21: 120 ug via INTRAVENOUS

## 2023-12-21 MED ORDER — TRAMADOL HCL 50 MG PO TABS
50.0000 mg | ORAL_TABLET | Freq: Four times a day (QID) | ORAL | 0 refills | Status: DC | PRN
  Filled 2023-12-21: qty 20, 5d supply, fill #0

## 2023-12-21 MED ORDER — AMISULPRIDE (ANTIEMETIC) 5 MG/2ML IV SOLN: 10.0000 mg | Freq: Once | INTRAVENOUS | Status: DC | PRN

## 2023-12-21 MED ORDER — FENTANYL CITRATE (PF) 250 MCG/5ML IJ SOLN
INTRAMUSCULAR | Status: DC | PRN
  Administered 2023-12-21: 100 ug via INTRAVENOUS

## 2023-12-21 MED ORDER — SUGAMMADEX SODIUM 200 MG/2ML IV SOLN
INTRAVENOUS | Status: DC | PRN
  Administered 2023-12-21: 200 mg via INTRAVENOUS

## 2023-12-21 MED ORDER — FENTANYL CITRATE (PF) 100 MCG/2ML IJ SOLN
INTRAMUSCULAR | Status: AC
  Filled 2023-12-21: qty 2

## 2023-12-21 MED ORDER — ORAL CARE MOUTH RINSE: 15.0000 mL | Freq: Once | OROMUCOSAL | Status: AC

## 2023-12-21 MED ORDER — OXYCODONE HCL 5 MG PO TABS
5.0000 mg | ORAL_TABLET | Freq: Once | ORAL | Status: AC | PRN
  Administered 2023-12-21: 5 mg via ORAL

## 2023-12-21 MED ORDER — SODIUM CHLORIDE 0.9 % IV SOLN: INTRAVENOUS | Status: DC

## 2023-12-21 MED ORDER — PHENYLEPHRINE HCL-NACL 20-0.9 MG/250ML-% IV SOLN
INTRAVENOUS | Status: DC | PRN
Start: 2023-12-21 — End: 2023-12-21
  Administered 2023-12-21: 25 ug/min via INTRAVENOUS

## 2023-12-21 MED ORDER — DEXAMETHASONE SODIUM PHOSPHATE 10 MG/ML IJ SOLN
INTRAMUSCULAR | Status: DC | PRN
  Administered 2023-12-21: 5 mg via INTRAVENOUS

## 2023-12-21 MED ORDER — SODIUM CHLORIDE 0.9% FLUSH: 3.0000 mL | Freq: Two times a day (BID) | INTRAVENOUS | Status: DC

## 2023-12-21 MED ORDER — PROPOFOL 10 MG/ML IV BOLUS
INTRAVENOUS | Status: AC
  Filled 2023-12-21: qty 20

## 2023-12-21 MED ORDER — ONDANSETRON HCL 4 MG/2ML IJ SOLN
INTRAMUSCULAR | Status: DC | PRN
  Administered 2023-12-21: 4 mg via INTRAVENOUS

## 2023-12-21 MED ORDER — OXYCODONE HCL 5 MG/5ML PO SOLN: 5.0000 mg | Freq: Once | ORAL | Status: AC | PRN

## 2023-12-21 MED ORDER — ACETAMINOPHEN 500 MG PO TABS
1000.0000 mg | ORAL_TABLET | Freq: Once | ORAL | Status: AC
  Administered 2023-12-21: 1000 mg via ORAL
  Filled 2023-12-21: qty 2

## 2023-12-21 MED ORDER — SODIUM CHLORIDE 0.9 % IR SOLN
Status: DC | PRN
  Administered 2023-12-21: 1000 mL

## 2023-12-21 MED ORDER — FENTANYL CITRATE (PF) 100 MCG/2ML IJ SOLN
25.0000 ug | INTRAMUSCULAR | Status: DC | PRN
  Administered 2023-12-21: 50 ug via INTRAVENOUS
  Administered 2023-12-21: 25 ug via INTRAVENOUS
  Administered 2023-12-21: 50 ug via INTRAVENOUS
  Administered 2023-12-21: 25 ug via INTRAVENOUS

## 2023-12-21 MED ORDER — OXYCODONE HCL 5 MG PO TABS
ORAL_TABLET | ORAL | Status: AC
  Filled 2023-12-21: qty 1

## 2023-12-21 MED ORDER — FENTANYL CITRATE (PF) 250 MCG/5ML IJ SOLN
INTRAMUSCULAR | Status: AC
  Filled 2023-12-21: qty 5

## 2023-12-21 MED ORDER — SODIUM CHLORIDE 0.9% FLUSH: 3.0000 mL | INTRAVENOUS | Status: DC | PRN

## 2023-12-21 MED ORDER — ROCURONIUM BROMIDE 10 MG/ML (PF) SYRINGE
PREFILLED_SYRINGE | INTRAVENOUS | Status: DC | PRN
  Administered 2023-12-21: 60 mg via INTRAVENOUS

## 2023-12-21 MED ORDER — LIDOCAINE 2% (20 MG/ML) 5 ML SYRINGE
INTRAMUSCULAR | Status: DC | PRN
  Administered 2023-12-21: 100 mg via INTRAVENOUS

## 2023-12-21 MED ORDER — CHLORHEXIDINE GLUCONATE 0.12 % MT SOLN
OROMUCOSAL | Status: AC
  Administered 2023-12-21: 15 mL via OROMUCOSAL
  Filled 2023-12-21: qty 15

## 2023-12-21 MED ORDER — CEFAZOLIN SODIUM-DEXTROSE 2-4 GM/100ML-% IV SOLN
2.0000 g | INTRAVENOUS | Status: AC
  Administered 2023-12-21: 2 g via INTRAVENOUS
  Filled 2023-12-21: qty 100

## 2023-12-21 SURGICAL SUPPLY — 44 items
ADAPTER TITANIUM MEDIONICS (MISCELLANEOUS) ×1 IMPLANT
BAG DECANTER FOR FLEXI CONT (MISCELLANEOUS) ×1 IMPLANT
BIOPATCH RED 1 DISK 7.0 (GAUZE/BANDAGES/DRESSINGS) ×1 IMPLANT
BLADE SURG 11 STRL SS (BLADE) ×1 IMPLANT
CATH EXTENDED DIALYSIS (CATHETERS) IMPLANT
CHLORAPREP W/TINT 26 (MISCELLANEOUS) ×1 IMPLANT
COVER SURGICAL LIGHT HANDLE (MISCELLANEOUS) ×1 IMPLANT
DERMABOND ADVANCED .7 DNX12 (GAUZE/BANDAGES/DRESSINGS) ×1 IMPLANT
DERMABOND ADVANCED .7 DNX6 (GAUZE/BANDAGES/DRESSINGS) IMPLANT
DEVICE TROCAR PUNCTURE CLOSURE (ENDOMECHANICALS) ×1 IMPLANT
DRSG TEGADERM 4X4.75 (GAUZE/BANDAGES/DRESSINGS) ×3 IMPLANT
ELECT REM PT RETURN 9FT ADLT (ELECTROSURGICAL) ×1 IMPLANT
ELECTRODE REM PT RTRN 9FT ADLT (ELECTROSURGICAL) ×1 IMPLANT
GAUZE SPONGE 4X4 12PLY STRL (GAUZE/BANDAGES/DRESSINGS) ×1 IMPLANT
GLOVE SURG UNDER LTX SZ7.5 (GLOVE) ×1 IMPLANT
GOWN STRL REUS W/ TWL LRG LVL3 (GOWN DISPOSABLE) ×2 IMPLANT
GOWN STRL REUS W/ TWL XL LVL3 (GOWN DISPOSABLE) ×1 IMPLANT
GRASPER SUT TROCAR 14GX15 (MISCELLANEOUS) ×1 IMPLANT
IV NS 1000ML BAXH (IV SOLUTION) ×1 IMPLANT
KIT BASIN OR (CUSTOM PROCEDURE TRAY) ×1 IMPLANT
KIT TURNOVER KIT B (KITS) ×1 IMPLANT
NDL INSUFFLATION 14GA 120MM (NEEDLE) ×1 IMPLANT
NEEDLE INSUFFLATION 14GA 120MM (NEEDLE) ×1 IMPLANT
NEEDLE INSUFFLATION 150MM (ENDOMECHANICALS) IMPLANT
NS IRRIG 1000ML POUR BTL (IV SOLUTION) ×1 IMPLANT
PAD ARMBOARD 7.5X6 YLW CONV (MISCELLANEOUS) ×2 IMPLANT
SET CYSTO W/LG BORE CLAMP LF (SET/KITS/TRAYS/PACK) ×1 IMPLANT
SET EXT 12IN DIALYSIS STAY-SAF (MISCELLANEOUS) ×1 IMPLANT
SET TUBE SMOKE EVAC HIGH FLOW (TUBING) ×1 IMPLANT
SLEEVE Z-THREAD 5X100MM (TROCAR) ×2 IMPLANT
SPIKE FLUID TRANSFER (MISCELLANEOUS) ×1 IMPLANT
STYLET FALLER (MISCELLANEOUS) ×1 IMPLANT
STYLET FALLER MEDIONICS (MISCELLANEOUS) ×1 IMPLANT
SUT MNCRL AB 4-0 PS2 18 (SUTURE) ×1 IMPLANT
SUT PROLENE 0 SH 30 (SUTURE) ×2 IMPLANT
SUT SILK 0 TIES 10X30 (SUTURE) ×1 IMPLANT
TOWEL GREEN STERILE (TOWEL DISPOSABLE) ×1 IMPLANT
TOWEL GREEN STERILE FF (TOWEL DISPOSABLE) ×1 IMPLANT
TRAY LAPAROSCOPIC MC (CUSTOM PROCEDURE TRAY) ×1 IMPLANT
TROCAR 11X100 Z THREAD (TROCAR) IMPLANT
TROCAR 5MMX150MM (TROCAR) ×1 IMPLANT
TROCAR XCEL NON-BLD 5MMX100MML (ENDOMECHANICALS) ×1 IMPLANT
TROCAR Z-THREAD OPTICAL 5X100M (TROCAR) ×1 IMPLANT
WATER STERILE IRR 1000ML POUR (IV SOLUTION) ×1 IMPLANT

## 2023-12-21 NOTE — Transfer of Care (Signed)
 Immediate Anesthesia Transfer of Care Note  Patient: [name]  Procedure(s) Performed: LAPAROSCOPIC INSERTION CONTINUOUS AMBULATORY PERITONEAL DIALYSIS  (CAPD) CATHETER; LAPAROSCOPIC OMENTUMPEXY  Patient Location: PACU  Anesthesia Type:General  Level of Consciousness: awake, alert , oriented, drowsy, and patient cooperative  Airway & Oxygen Therapy: Patient Spontanous Breathing and Patient connected to face mask oxygen  Post-op Assessment: Report given to RN and Post -op Vital signs reviewed and stable  Post vital signs: Reviewed and stable  Last Vitals:  Vitals Value Taken Time  BP 128/67 12/21/23 1435  Temp    Pulse 74 12/21/23 1435  Resp 18 12/21/23 1435  SpO2 95 % 12/21/23 1435  Vitals shown include unfiled device data.  Last Pain:  Vitals:   12/21/23 1118  TempSrc:   PainSc: 0-No pain         Complications: No notable events documented.

## 2023-12-21 NOTE — Progress Notes (Signed)
 50 mcg IV fentanyl wasted in stericycle with Cherie Ouch RN after patient discharge.

## 2023-12-21 NOTE — Interval H&P Note (Signed)
 History and Physical Interval Note:  12/21/2023 12:18 PM  [name]  has presented today for surgery, with the diagnosis of ESRD.  The various methods of treatment have been discussed with the patient and family. After consideration of risks, benefits and other options for treatment, the patient has consented to  Procedure(s): LAPAROSCOPIC INSERTION CONTINUOUS AMBULATORY PERITONEAL DIALYSIS  (CAPD) CATHETER (N/A) as a surgical intervention.  The patient's history has been reviewed, patient examined, no change in status, stable for surgery.  I have reviewed the patient's chart and labs.  Questions were answered to the patient's satisfaction.     [name]

## 2023-12-21 NOTE — Anesthesia Procedure Notes (Signed)
 Procedure Name: Intubation Date/Time: 12/21/2023 1:15 PM  Performed by: Yolonda Kida, CRNAPre-anesthesia Checklist: Patient identified, Emergency Drugs available, Suction available and Patient being monitored Patient Re-evaluated:Patient Re-evaluated prior to induction Oxygen Delivery Method: Circle System Utilized Preoxygenation: Pre-oxygenation with 100% oxygen Induction Type: IV induction Ventilation: Mask ventilation without difficulty Laryngoscope Size: Mac and 3 Grade View: Grade I Tube type: Oral Tube size: 7.0 mm Number of attempts: 1 Airway Equipment and Method: Stylet Placement Confirmation: ETT inserted through vocal cords under direct vision, positive ETCO2 and breath sounds checked- equal and bilateral Secured at: 22 cm Tube secured with: Tape Dental Injury: Teeth and Oropharynx as per pre-operative assessment

## 2023-12-21 NOTE — Discharge Instructions (Signed)
 F/U with your Nephrologist for your first flush

## 2023-12-21 NOTE — Op Note (Signed)
 DATE OF SERVICE: 12/21/2023  PATIENT:  [name]  58 y.o. female  PRE-OPERATIVE DIAGNOSIS:  ESRD  POST-OPERATIVE DIAGNOSIS:  Same  PROCEDURE:   1) laparoscopic omentopexy 2) laparoscopic peritoneal dialysis catheter placement  SURGEON:  Surgeons and Role:    * [name], MD - Primary  ASSISTANT: Lianne Cure, PA-C  An experienced assistant was required given the complexity of this procedure and the standard of surgical care. My assistant helped with exposure through counter tension, suctioning, ligation and retraction to better visualize the surgical field.  My assistant expedited sewing during the case by following my sutures. Wherever I use the term "we" in the report, my assistant actively helped me with that portion of the procedure.  ANESTHESIA:   general  EBL: minimal  BLOOD ADMINISTERED:none  DRAINS: none   LOCAL MEDICATIONS USED:  NONE  SPECIMEN:  none  COUNTS: confirmed correct.  TOURNIQUET:  none  PATIENT DISPOSITION:  PACU - hemodynamically stable.   Delay start of Pharmacological VTE agent (>24hrs) due to surgical blood loss or risk of bleeding: no  INDICATION FOR PROCEDURE: [name] is a 58 y.o. female with ESRD. After careful discussion of risks, benefits, and alternatives the patient was offered laparoscopic PD catheter placement. The patient understood and wished to proceed.  OPERATIVE FINDINGS: successful omentopexy. Good flow in and out of catheter at completion of case.  DESCRIPTION OF PROCEDURE: After identification of the patient in the pre-operative holding area, the patient was transferred to the operating room. The patient was positioned supine on the operating room table. Anesthesia was induced. The abdomen was prepped and draped in standard fashion. A surgical pause was performed confirming correct patient, procedure, and operative location.  A Veress needle was introduced into the abdomen at Palmer's point,  immediately below the left costal margin.  A saline drop test was used to confirm intra-abdominal position.  The Veress needle was connected to insufflation tubing and insufflation initiated.  A low opening pressure and good flow rate were noted.  A 5 mm trocar was introduced into the right upper quadrant using Visi-View technique.  The obturator was removed and the abdomen inspected with a 30 degree angled laparoscope.  No evidence of Veress needle injury was noted.  An additional 5 mm trocar was inserted a handsbreadth away to facilitate the case.  The omentum was grasped with an atraumatic grasper and elevated to the upper abdomen.  A laparoscopic suture passer was used to deliver a 0 Prolene suture through the omentum.  The suture was secured down to the anterior abdominal wall with a knot.  The omentum was pexied in place.  The abdomen was desufflated.  The peritoneal catheter was measured across the abdominal wall surface.  A mark was made by the cuff in the periumbilical abdomen.  The abdomen was reinsufflated.  An advantage 5 mm trocar was then used to create a skiving path through the anterior rectus fascia, rectus muscle, and peritoneum.  The laparoscopic trocar entered in the suprapubic abdomen directing towards the pelvis.  The working end of the catheter was delivered through the trocar.  The cuff was brought into the abdomen and then placed back into the rectus muscle.  The abdomen was desufflated again.  A "swan-neck" extension tubing for the dialysis catheter was brought onto the field.  The catheter was laid across the desufflated abdomen to lay across the upper quadrant and exit the left abdomen.  The catheter was cut.  The 2 pieces  of catheter were then connected using a Christmas tree adapter.  This was secured in place with 2 interrupted sutures of 0 Prolene.  The connected catheter was then tunneled to a counterincision in the upper quadrant and then out the lateral abdomen in a downward  deflection.  Great care was taken to avoid twisting or kinking the catheter.  The abdomen was reinsufflated.  The peritoneum was inspected to ensure the catheter did not enter the cavity.  Satisfied we desufflated the abdomen.  The catheter was connected to cystoscopy tubing.  The catheter flushed and drained without any difficulty.  The trochars were removed.  All incisions were closed with interrupted 4-0 Monocryl sutures.  Dermabond was applied.  A sterile bandage was applied to the peritoneal dialysis catheter.   Upon completion of the case instrument and sharps counts were confirmed correct. The patient was transferred to the PACU in good condition. I was present for all portions of the procedure.  FOLLOW UP PLAN: Assuming a normal postoperative course, we will see the patient on an as needed basis.   Rande Brunt. Lenell Antu, MD [facility] [place] Phone Number: [phone] 12/21/2023 2:22 PM

## 2023-12-22 ENCOUNTER — Encounter (HOSPITAL_COMMUNITY): Payer: Self-pay | Admitting: Vascular Surgery

## 2023-12-22 ENCOUNTER — Other Ambulatory Visit: Payer: Self-pay | Admitting: Primary Care

## 2023-12-22 DIAGNOSIS — N2581 Secondary hyperparathyroidism of renal origin: Secondary | ICD-10-CM | POA: Diagnosis not present

## 2023-12-22 DIAGNOSIS — E785 Hyperlipidemia, unspecified: Secondary | ICD-10-CM

## 2023-12-22 DIAGNOSIS — Z992 Dependence on renal dialysis: Secondary | ICD-10-CM | POA: Diagnosis not present

## 2023-12-22 DIAGNOSIS — N186 End stage renal disease: Secondary | ICD-10-CM | POA: Diagnosis not present

## 2023-12-22 NOTE — Anesthesia Postprocedure Evaluation (Signed)
 Anesthesia Post Note  Patient: [name]  Procedure(s) Performed: LAPAROSCOPIC INSERTION CONTINUOUS AMBULATORY PERITONEAL DIALYSIS  (CAPD) CATHETER; LAPAROSCOPIC OMENTUMPEXY     Patient location during evaluation: PACU Anesthesia Type: General Level of consciousness: awake and alert Pain management: pain level controlled Vital Signs Assessment: post-procedure vital signs reviewed and stable Respiratory status: spontaneous breathing, nonlabored ventilation, respiratory function stable and patient connected to nasal cannula oxygen Cardiovascular status: blood pressure returned to baseline and stable Postop Assessment: no apparent nausea or vomiting Anesthetic complications: no  No notable events documented.  Last Vitals:  Vitals:   12/21/23 1535 12/21/23 1545  BP:  122/77  Pulse:  74  Resp:  15  Temp:  37.1 C  SpO2: 94% 95%    Last Pain:  Vitals:   12/21/23 1535  TempSrc:   PainSc: 4                  Osmond Steckman L Rayson Rando

## 2023-12-25 ENCOUNTER — Telehealth: Payer: Self-pay

## 2023-12-25 ENCOUNTER — Ambulatory Visit: Payer: Self-pay | Admitting: Primary Care

## 2023-12-25 DIAGNOSIS — N2581 Secondary hyperparathyroidism of renal origin: Secondary | ICD-10-CM | POA: Diagnosis not present

## 2023-12-25 DIAGNOSIS — N186 End stage renal disease: Secondary | ICD-10-CM | POA: Diagnosis not present

## 2023-12-25 DIAGNOSIS — Z992 Dependence on renal dialysis: Secondary | ICD-10-CM | POA: Diagnosis not present

## 2023-12-25 NOTE — Telephone Encounter (Signed)
  Chief Complaint: vaginal pain and itching Symptoms: burning with urination, vaginal pain and itching Frequency: onset 12/23/23 Pertinent Negatives: Patient denies vaginal discharge, fever, vaginal bleeding Disposition: [] ED /[] Urgent Care (no appt availability in office) / [x] Appointment(In office/virtual)/ []  [facility]/ [] Home Care/ [x] Refused Recommended Disposition /[] [facility]/ []  Follow-up with PCP Additional Notes: Patient states she does not have time to make an appointment, offered acute visits tomorrow at available locations. Patient states she has dialysis Wednesday and tomorrow she has to go to [place] for a dental procedure, she states between her recent surgeries and dialysis she does not have time for this. Patient hung up on triage RN, states "this is frustrating they used to just call something in for me". Please advise.  Copied from CRM [id number]. Topic: Clinical - Red Word Triage >> Dec 25, 2023  3:44 PM [place] wrote: Red Word that prompted transfer to Nurse Triage: Patient has yeast infection, very painful. Started over the weekend. Reason for Disposition  MODERATE-SEVERE itching (i.e., interferes with school, work, or sleep)  Answer Assessment - Initial Assessment Questions 1. SYMPTOM: "What's the main symptom you're concerned about?" (e.g., pain, itching, dryness)     Pain and itching.  2. LOCATION: "Where is the  pain and itchiness located?" (e.g., inside/outside, left/right)     Both sides, inside.  3. ONSET: "When did the  pain and itching  start?"     Saturday 12/23/23.  4. PAIN: "Is there any pain?" If Yes, ask: "How bad is it?" (Scale: 1-10; mild, moderate, severe)   -  MILD (1-3): Doesn't interfere with normal activities.    -  MODERATE (4-7): Interferes with normal activities (e.g., work or school) or awakens from sleep.     -  SEVERE (8-10): Excruciating pain, unable to do any normal activities.     8/10, denies any home treatment  today.  5. ITCHING: "Is there any itching?" If Yes, ask: "How bad is it?" (Scale: 1-10; mild, moderate, severe)     Severe.  6. CAUSE: "What do you think is causing the discharge?" "Have you had the same problem before? What happened then?"     Denies any discharge. She states she always gets a yeast infection after surgery and she states she ran out of minocycline she gets one.  7. OTHER SYMPTOMS: "Do you have any other symptoms?" (e.g., fever, itching, vaginal bleeding, pain with urination, injury to genital area, vaginal foreign body)     Painful urination.  Protocols used: Vaginal Symptoms-A-AH

## 2023-12-25 NOTE — Telephone Encounter (Signed)
 Patient stated that she feels as though she has a new vaginal yeast infection due to antibiotics provided after her surgery.  This nurse recommended calling her PCP, Ms. Chestine Spore, NP, for medication to treat yeast infection.  Patient stated understanding.

## 2023-12-26 ENCOUNTER — Other Ambulatory Visit: Payer: Self-pay | Admitting: Nephrology

## 2023-12-26 ENCOUNTER — Ambulatory Visit
Admission: RE | Admit: 2023-12-26 | Discharge: 2023-12-26 | Disposition: A | Discharge status: Home / Self Care | Source: Ambulatory Visit | Attending: Nephrology

## 2023-12-26 ENCOUNTER — Other Ambulatory Visit: Payer: Self-pay | Admitting: Physician Assistant

## 2023-12-26 ENCOUNTER — Telehealth: Payer: Self-pay

## 2023-12-26 ENCOUNTER — Ambulatory Visit: Payer: Self-pay

## 2023-12-26 DIAGNOSIS — K5900 Constipation, unspecified: Secondary | ICD-10-CM

## 2023-12-26 DIAGNOSIS — T85691A Other mechanical complication of intraperitoneal dialysis catheter, initial encounter: Secondary | ICD-10-CM | POA: Diagnosis not present

## 2023-12-26 MED ORDER — FLUCONAZOLE 150 MG PO TABS: 150.0000 mg | ORAL_TABLET | Freq: Every day | ORAL | 0 refills | Status: DC

## 2023-12-26 NOTE — Telephone Encounter (Signed)
 Patients PCP is out of office. She will need to be seen prior to any medications being written

## 2023-12-26 NOTE — Patient Outreach (Signed)
 Care Coordination   12/26/2023 Name: [name] MRN: [id number] DOB: 10/17/1966   Care Coordination Outreach Attempts:  Successful telephone outreach with patient.  Patient states she is not available for call due to being at provider appointment. Request return call.   Follow Up Plan:  Additional outreach attempts will be made to offer the patient complex care management information and services.   Encounter Outcome:  Patient Request to Call Back   Care Coordination Interventions:  No, not indicated    George Ina RN, BSN, CCM [place]  [facility], Population Health Case Manager Phone: [phone]

## 2023-12-26 NOTE — Telephone Encounter (Signed)
 Pt called with c/o of a yeast infection. She has tried to get in with PCP who is out this week and the soonest they can see her is Thurs. She is requesting rx for this. APP to order it. Pt is aware and very grateful.

## 2023-12-26 NOTE — Telephone Encounter (Signed)
 Called and scheduled patient appt for 03/13 with Dr. Ermalene Searing.

## 2023-12-27 DIAGNOSIS — N2581 Secondary hyperparathyroidism of renal origin: Secondary | ICD-10-CM | POA: Diagnosis not present

## 2023-12-27 DIAGNOSIS — N186 End stage renal disease: Secondary | ICD-10-CM | POA: Diagnosis not present

## 2023-12-27 DIAGNOSIS — Z992 Dependence on renal dialysis: Secondary | ICD-10-CM | POA: Diagnosis not present

## 2023-12-28 ENCOUNTER — Ambulatory Visit: Admitting: Family Medicine

## 2023-12-28 ENCOUNTER — Ambulatory Visit: Payer: Self-pay

## 2023-12-28 NOTE — Patient Outreach (Signed)
 Care Coordination   12/28/2023 Name: [name] MRN: [id number] DOB: 1966-03-18   Care Coordination Outreach Attempts:  Successful contact made with patient.  Patient reports having PD catheter placed recently and has to go to the doctor today for follow up.  She reports she is leaving now and request to reschedule.   Follow Up Plan:  Additional outreach attempts will be made to offer the patient complex care management information and services.   Encounter Outcome:  Patient Request to Call Back   Care Coordination Interventions:  No, not indicated    George Ina RN, BSN, CCM [place]  [facility], Population Health Case Manager Phone: [phone]

## 2023-12-29 DIAGNOSIS — N186 End stage renal disease: Secondary | ICD-10-CM | POA: Diagnosis not present

## 2023-12-29 DIAGNOSIS — N2581 Secondary hyperparathyroidism of renal origin: Secondary | ICD-10-CM | POA: Diagnosis not present

## 2023-12-29 DIAGNOSIS — Z992 Dependence on renal dialysis: Secondary | ICD-10-CM | POA: Diagnosis not present

## 2024-01-01 DIAGNOSIS — Z992 Dependence on renal dialysis: Secondary | ICD-10-CM | POA: Diagnosis not present

## 2024-01-01 DIAGNOSIS — N2581 Secondary hyperparathyroidism of renal origin: Secondary | ICD-10-CM | POA: Diagnosis not present

## 2024-01-01 DIAGNOSIS — N186 End stage renal disease: Secondary | ICD-10-CM | POA: Diagnosis not present

## 2024-01-02 ENCOUNTER — Ambulatory Visit: Payer: Self-pay

## 2024-01-02 DIAGNOSIS — Z139 Encounter for screening, unspecified: Secondary | ICD-10-CM

## 2024-01-02 NOTE — Patient Instructions (Signed)
 Visit Information  Thank you for taking time to visit with me today. Please don't hesitate to contact me if I can be of assistance to you.   Following are the goals we discussed today:   Goals Addressed             This Visit's Progress    Management and education of health conditions/Community resource assistance       Interventions Today    Flowsheet Row Most Recent Value  Chronic Disease   Chronic disease during today's visit Chronic Kidney Disease/End Stage Renal Disease (ESRD), Other  [Chronic back pain/mobility concerns, Hidradenitis]  General Interventions   General Interventions Discussed/Reviewed General Interventions Discussed, Doctor Visits  [Evaluation of current treatment plan for listed health conditions and patient's adherence to plan as established by provider. Discussed chronic back pain management.]  Doctor Visits Discussed/Reviewed Doctor Visits Discussed  [facility] upcoming provider appointments and ensured transportation to appointments.]  Exercise Interventions   Exercise Discussed/Reviewed Physical Activity  Physical Activity Discussed/Reviewed Physical Activity Discussed  Education Interventions   Provided Verbal Education On When to see the doctor, Community Resources  [place] to contact primary provider for new or ongoing symptoms. Advised to contact nephrology for issues with PD catheter. Referral sent to social worker for utility assistance.]  Pharmacy Interventions   Pharmacy Dicussed/Reviewed Pharmacy Topics Discussed  [Medications and compliance reviewed. Advised patient to call provider for refill of Minocycline.]  Safety Interventions   Safety Discussed/Reviewed Fall Risk  [Assess for falls and use of ambulatory devices. Fall prevention education article sent to patient in MyChart.]              Our next appointment is by telephone on 01/18/24 at 1:30 PM  Please call the care guide team at [phone] if you need to cancel or reschedule your  appointment.   If you are experiencing a Mental Health or Behavioral Health Crisis or need someone to talk to, please call the Suicide and Crisis Lifeline: 988 call 1-800-273-TALK (toll free, 24 hour hotline)  Patient verbalizes understanding of instructions and care plan provided today and agrees to view in MyChart. Active MyChart status and patient understanding of how to access instructions and care plan via MyChart confirmed with patient.     George Ina RN, BSN, CCM [place]  [facility], Population Health Case Manager Phone: [phone]  Fall Prevention in the Home, Adult Falls can cause injuries and can happen to people of all ages. There are many things you can do to make your home safer and to help prevent falls. What actions can I take to prevent falls? General information Use good lighting in all rooms. Make sure to: Replace any light bulbs that burn out. Turn on the lights in dark areas and use night-lights. Keep items that you use often in easy-to-reach places. Lower the shelves around your home if needed. Move furniture so that there are clear paths around it. Do not use throw rugs or other things on the floor that can make you trip. If any of your floors are uneven, fix them. Add color or contrast paint or tape to clearly mark and help you see: Grab bars or handrails. First and last steps of staircases. Where the edge of each step is. If you use a ladder or stepladder: Make sure that it is fully opened. Do not climb a closed ladder. Make sure the sides of the ladder are locked in place. Have someone hold the ladder while you use it.  Know where your pets are as you move through your home. What can I do in the bathroom?     Keep the floor dry. Clean up any water on the floor right away. Remove soap buildup in the bathtub or shower. Buildup makes bathtubs and showers slippery. Use non-skid mats or decals on the floor of the bathtub or  shower. Attach bath mats securely with double-sided, non-slip rug tape. If you need to sit down in the shower, use a non-slip stool. Install grab bars by the toilet and in the bathtub and shower. Do not use towel bars as grab bars. What can I do in the bedroom? Make sure that you have a light by your bed that is easy to reach. Do not use any sheets or blankets on your bed that hang to the floor. Have a firm chair or bench with side arms that you can use for support when you get dressed. What can I do in the kitchen? Clean up any spills right away. If you need to reach something above you, use a step stool with a grab bar. Keep electrical cords out of the way. Do not use floor polish or wax that makes floors slippery. What can I do with my stairs? Do not leave anything on the stairs. Make sure that you have a light switch at the top and the bottom of the stairs. Make sure that there are handrails on both sides of the stairs. Fix handrails that are broken or loose. Install non-slip stair treads on all your stairs if they do not have carpet. Avoid having throw rugs at the top or bottom of the stairs. Choose a carpet that does not hide the edge of the steps on the stairs. Make sure that the carpet is firmly attached to the stairs. Fix carpet that is loose or worn. What can I do on the outside of my home? Use bright outdoor lighting. Fix the edges of walkways and driveways and fix any cracks. Clear paths of anything that can make you trip, such as tools or rocks. Add color or contrast paint or tape to clearly mark and help you see anything that might make you trip as you walk through a door, such as a raised step or threshold. Trim any bushes or trees on paths to your home. Check to see if handrails are loose or broken and that both sides of all steps have handrails. Install guardrails along the edges of any raised decks and porches. Have leaves, snow, or ice cleared regularly. Use sand, salt,  or ice melter on paths if you live where there is ice and snow during the winter. Clean up any spills in your garage right away. This includes grease or oil spills. What other actions can I take? Review your medicines with your doctor. Some medicines can cause dizziness or changes in blood pressure, which increase your risk of falling. Wear shoes that: Have a low heel. Do not wear high heels. Have rubber bottoms and are closed at the toe. Feel good on your feet and fit well. Use tools that help you move around if needed. These include: Canes. Walkers. Scooters. Crutches. Ask your doctor what else you can do to help prevent falls. This may include seeing a physical therapist to learn to do exercises to move better and get stronger. Where to find more information Centers for Disease Control and Prevention, [employer]: TonerPromos.no [employer] on Aging: BaseRingTones.pl National Institute on Aging: BaseRingTones.pl Contact a doctor if:  You are afraid of falling at home. You feel weak, drowsy, or dizzy at home. You fall at home. Get help right away if you: Lose consciousness or have trouble moving after a fall. Have a fall that causes a head injury. These symptoms may be an emergency. Get help right away. Call 911. Do not wait to see if the symptoms will go away. Do not drive yourself to the hospital. This information is not intended to replace advice given to you by your health care provider. Make sure you discuss any questions you have with your health care provider. Document Revised: 06/06/2022 Document Reviewed: 06/06/2022 Elsevier Patient Education  2024 [employer].

## 2024-01-02 NOTE — Patient Outreach (Signed)
 Care Coordination   Initial Visit Note   01/02/2024 Name: [name] MRN: [id number] DOB: 1966-05-02  [name] is a 58 y.o. year old female who sees Doreene Nest, NP for primary care. I spoke with  [name] by phone today.  What matters to the patients health and wellness today?  Patient reports that her PD catheter placed 12/21/23 is not flushing. She has follow-up on 01/04/24 to address. Reports that she continues to go to dialysis center Monday, Wednesday, and Friday. Reports she is prescribed daily antibiotics for hidradenitis, but recently ran out. States she plans to call provider office for refill. States the circulation in her legs limits her mobilitly.  Reports a throbbing sensation "feeling like something moving." States this has been addressed previously, although unable to find in chart review. Patient reports getting SNAP benefits. She received payment from DSS last month for utility assistance. Reports being prescribed something for nausea although she is not sure of the name of medication.   Goals Addressed             This Visit's Progress    Management and education of health conditions/Community resource assistance       Interventions Today    Flowsheet Row Most Recent Value  Chronic Disease   Chronic disease during today's visit Chronic Kidney Disease/End Stage Renal Disease (ESRD), Other  [Chronic back pain/mobility concerns, Hidradenitis]  General Interventions   General Interventions Discussed/Reviewed General Interventions Discussed, Doctor Visits  [Evaluation of current treatment plan for listed health conditions and patient's adherence to plan as established by provider. Discussed chronic back pain management.]  Doctor Visits Discussed/Reviewed Doctor Visits Discussed  [facility] upcoming provider appointments and ensured transportation to appointments.]  Exercise Interventions   Exercise Discussed/Reviewed Physical Activity   Physical Activity Discussed/Reviewed Physical Activity Discussed  Education Interventions   Provided Verbal Education On When to see the doctor, Community Resources  [place] to contact primary provider for new or ongoing symptoms. Advised to contact nephrology for issues with PD catheter. Referral sent to social worker for utility assistance.]  Pharmacy Interventions   Pharmacy Dicussed/Reviewed Pharmacy Topics Discussed  [Medications and compliance reviewed. Advised patient to call provider for refill of Minocycline.]  Safety Interventions   Safety Discussed/Reviewed Fall Risk  [Assess for falls and use of ambulatory devices. Fall prevention education article sent to patient in MyChart.]              SDOH assessments and interventions completed:  Yes  SDOH Interventions Today    Flowsheet Row Most Recent Value  SDOH Interventions   Food Insecurity Interventions Intervention Not Indicated  Housing Interventions Intervention Not Indicated  Transportation Interventions Intervention Not Indicated  Utilities Interventions Other (Comment)  [Refer to social worker]        Care Coordination Interventions:  Yes, provided   Follow up plan: Follow up call scheduled for 01/18/24 at 1:30 PM    Encounter Outcome:  Patient Visit Completed   George Ina RN, BSN, CCM [place]  [facility], Population Health Case Manager Phone: [phone]

## 2024-01-03 ENCOUNTER — Telehealth: Payer: Self-pay

## 2024-01-03 ENCOUNTER — Encounter: Payer: Self-pay | Admitting: Infectious Diseases

## 2024-01-03 DIAGNOSIS — N2581 Secondary hyperparathyroidism of renal origin: Secondary | ICD-10-CM | POA: Diagnosis not present

## 2024-01-03 DIAGNOSIS — L732 Hidradenitis suppurativa: Secondary | ICD-10-CM

## 2024-01-03 DIAGNOSIS — Z992 Dependence on renal dialysis: Secondary | ICD-10-CM | POA: Diagnosis not present

## 2024-01-03 DIAGNOSIS — N186 End stage renal disease: Secondary | ICD-10-CM | POA: Diagnosis not present

## 2024-01-03 NOTE — Telephone Encounter (Signed)
 Patient called requesting refill on minocycline. Informed patient I would need to send a message to provider because last prescription sent in was August of 2023 by [facility] who is no longer at our office. Patient then stated that her new provider Judeth Cornfield Dixon,NP prescribed it to her.   I read office notes from August 2024 with Dixon,NP that stated -  Hidradenitis suppurativa       Last fill in April 2024. She has been working with breast surgeon from previous notes for consideration of surgical excision. We did not discuss this today.         Patient was adamant that Dixon,NP prescribed minocycline.  Message sent to Dixon,NP and Clinic Lead Valarie Cones, LPN.   Zurisadai Helminiak Lesli Albee, CMA

## 2024-01-04 ENCOUNTER — Telehealth: Payer: Self-pay

## 2024-01-04 ENCOUNTER — Ambulatory Visit: Payer: Medicare HMO | Admitting: Primary Care

## 2024-01-04 ENCOUNTER — Ambulatory Visit: Admitting: Physician Assistant

## 2024-01-04 VITALS — BP 96/62 | HR 92 | Temp 97.7°F | Ht 65.0 in | Wt 175.7 lb

## 2024-01-04 DIAGNOSIS — T85611A Breakdown (mechanical) of intraperitoneal dialysis catheter, initial encounter: Secondary | ICD-10-CM

## 2024-01-04 NOTE — Progress Notes (Signed)
   Telephone encounter was:  Successful.  Complex Care Management Note Care Guide Note  01/04/2024 Name: [name] MRN: [id number] DOB: Feb 25, 1966  [name] Havey is a 58 y.o. year old female who is a primary care patient of Doreene Nest, NP . The community resource team was consulted for assistance with Financial Difficulties related to Financial strain and Utility Assistance   SDOH screenings and interventions completed:  No        Care guide performed the following interventions: Patient provided with information about care guide support team and interviewed to confirm resource needs.Patient requested I call back   Follow Up Plan:  Care guide will follow up with patient by phone over the next day  Encounter Outcome:  Patient Request to Call Back    Lenard Forth [facility]  [facility] Guide, Phone: [phone] Fax: [phone] Website: [email or web address]

## 2024-01-04 NOTE — Progress Notes (Unsigned)
  POST OPERATIVE OFFICE NOTE    CC:  F/u for surgery  HPI:  This is a 58 y.o. female who is s/p *** on *** by Dr. Marland Kitchen    Pt returns today for follow up.  Pt states ***   Allergies  Allergen Reactions   Genvoya [Elviteg-Cobic-Emtricit-Tenofaf] Hives   Lisinopril Cough   Aldactone [Spironolactone] Hives   Tegaderm Ag Mesh [Silver] Itching    Current Outpatient Medications  Medication Sig Dispense Refill   acetaminophen (TYLENOL) 500 MG tablet Take 1,000 mg by mouth every 6 (six) hours as needed for mild pain.     albuterol (VENTOLIN HFA) 108 (90 Base) MCG/ACT inhaler INHALE 2 PUFFS INTO THE LUNGS UP TO EVERY 6HRS AS NEEDED FOR WHEEZING/SHORTNESS OF BREATH 8 each 0   allopurinol (ZYLOPRIM) 100 MG tablet TAKE 1 TABLET BY MOUTH EVERY DAY FOR GOUT PREVENTION 90 tablet 2   amLODipine (NORVASC) 10 MG tablet Take 10 mg by mouth daily.     aspirin EC 81 MG tablet Take 81 mg by mouth daily.     AURYXIA 1 GM 210 MG(Fe) tablet Take 210 mg by mouth 3 (three) times daily with meals.     Blood Glucose Monitoring Suppl (FREESTYLE LITE) w/Device KIT Inject 1 each into the skin daily. 1 kit 0   carvedilol (COREG) 12.5 MG tablet Take 12.5 mg by mouth 2 (two) times daily.     dolutegravir ([name]) 50 MG tablet Take 1 tablet (50 mg total) by mouth daily. 30 tablet 11   emtricitabine-tenofovir AF (DESCOVY) 200-25 MG tablet Take 1 tablet by mouth daily. 30 tablet 11   fluconazole (DIFLUCAN) 150 MG tablet Take 1 tablet (150 mg total) by mouth daily. 1 tablet 0   fluticasone (FLOVENT HFA) 110 MCG/ACT inhaler Inhale 1 puff into the lungs 2 (two) times daily.     Fluticasone Furoate (ARNUITY ELLIPTA) 100 MCG/ACT AEPB Inhale 1 puff into the lungs daily. 90 each 1   Lancets (ONETOUCH ULTRASOFT) lancets Use as instructed to test blood sugar daily 100 each 5   levocetirizine (XYZAL) 5 MG tablet Take 1 tablet (5 mg total) by mouth every evening. For allergies 90 tablet 3   levothyroxine (SYNTHROID) 175 MCG  tablet TAKE 1 TABLET BY MOUTH EVERY DAY 90 tablet 3   losartan (COZAAR) 50 MG tablet Take 50 mg by mouth daily.     minocycline (DYNACIN) 100 MG tablet Take 1 tablet (100 mg total) by mouth 2 (two) times daily. 60 tablet 11   rosuvastatin (CRESTOR) 10 MG tablet TAKE 1 TABLET BY MOUTH EVERY DAY FOR CHOLESTEROL 90 tablet 0   saxagliptin HCl (ONGLYZA) 2.5 MG TABS tablet TAKE 1 TABLET (2.5 MG TOTAL) BY MOUTH DAILY. FOR DIABETES. 90 tablet 0   traMADol (ULTRAM) 50 MG tablet Take 1 tablet (50 mg total) by mouth every 6 (six) hours as needed. 30 tablet 0   traMADol (ULTRAM) 50 MG tablet Take 1 tablet (50 mg total) by mouth every 6 (six) hours as needed. 20 tablet 0   No current facility-administered medications for this visit.     ROS:  See HPI  Physical Exam:  ***  Incision:  *** Extremities:  *** Neuro: *** Abdomen:  ***    Assessment/Plan:  This is a 58 y.o. female who is s/p: ***  -***   Loel Dubonnet, PA-C [facility] [phone]   Clinic MD:  ***

## 2024-01-05 ENCOUNTER — Other Ambulatory Visit: Payer: Self-pay | Admitting: Infectious Diseases

## 2024-01-05 ENCOUNTER — Other Ambulatory Visit: Payer: Self-pay

## 2024-01-05 DIAGNOSIS — T85611A Breakdown (mechanical) of intraperitoneal dialysis catheter, initial encounter: Secondary | ICD-10-CM

## 2024-01-05 DIAGNOSIS — Z992 Dependence on renal dialysis: Secondary | ICD-10-CM | POA: Diagnosis not present

## 2024-01-05 DIAGNOSIS — L732 Hidradenitis suppurativa: Secondary | ICD-10-CM

## 2024-01-05 DIAGNOSIS — N2581 Secondary hyperparathyroidism of renal origin: Secondary | ICD-10-CM | POA: Diagnosis not present

## 2024-01-05 DIAGNOSIS — N186 End stage renal disease: Secondary | ICD-10-CM | POA: Diagnosis not present

## 2024-01-05 MED ORDER — MINOCYCLINE HCL 100 MG PO TABS: 100.0000 mg | ORAL_TABLET | Freq: Two times a day (BID) | ORAL | 0 refills | Status: DC

## 2024-01-05 NOTE — Telephone Encounter (Addendum)
 [name], please tell  her that I agree that chronic use of minocycline (or any antibiotic) is not best practice for chronic hidradenitis suppurativa.  I agree with dermatology referral and happy to place.  Also, she missed her appointment with [place] this week.  Need to have her rescheduled.  Needs to be placed in a 40-minute slot for at the end of the morning or afternoon session.

## 2024-01-05 NOTE — Addendum Note (Signed)
 Addended by: Blanchard Kelch on: 01/05/2024 08:47 AM   Modules accepted: Orders

## 2024-01-05 NOTE — Telephone Encounter (Signed)
 Called patient, she is fine with 30 day refill. States" the prescription might have been written as twice daily, but she was only taking it once daily" Patient then stated " she would not like a referral at this time and will reach out to her PCP again for future medication refills. " She also stated stated that in the future she may " seek out another ID provider". Advised patient to reach out to our office is she needed any assistance with finding another ID provider. Patient hung up the phone in the mid sentence as I was trying to confirm her pharmacy.

## 2024-01-05 NOTE — Telephone Encounter (Signed)
**Note De-identified  [place] Obfuscation** Please advise 

## 2024-01-05 NOTE — Telephone Encounter (Signed)
 As long as she stays in care with whomever she feels comfortable with, happy to support whatever her decision - she is due for an appointment soon for medication refills. I am going to message her PCP also.

## 2024-01-08 ENCOUNTER — Telehealth: Payer: Self-pay | Admitting: *Deleted

## 2024-01-08 ENCOUNTER — Telehealth: Payer: Self-pay

## 2024-01-08 DIAGNOSIS — N2581 Secondary hyperparathyroidism of renal origin: Secondary | ICD-10-CM | POA: Diagnosis not present

## 2024-01-08 DIAGNOSIS — N186 End stage renal disease: Secondary | ICD-10-CM | POA: Diagnosis not present

## 2024-01-08 DIAGNOSIS — Z992 Dependence on renal dialysis: Secondary | ICD-10-CM | POA: Diagnosis not present

## 2024-01-08 NOTE — Progress Notes (Signed)
   Telephone encounter was:  Successful.  Complex Care Management Note Care Guide Note  01/08/2024 Name: [name] MRN: [id number] DOB: Mar 11, 1966  [name] is a 58 y.o. year old female who is a primary care patient of Doreene Nest, NP . The community resource team was consulted for assistance with Food Insecurity and Financial Difficulties related to Financial strain   SDOH screenings and interventions completed:  Yes  Social Drivers of Health From This Encounter   Food Insecurity: Food Insecurity Present (01/08/2024)   Hunger Vital Sign    Worried About Running Out of Food in the Last Year: Sometimes true    Ran Out of Food in the Last Year: Sometimes true  Financial Resource Strain: High Risk (01/08/2024)   Overall Financial Resource Strain (CARDIA)    Difficulty of Paying Living Expenses: Hard  Utilities: At Risk (01/08/2024)   Utilities    Threatened with loss of utilities: Yes    SDOH Interventions Today    Flowsheet Row Most Recent Value  SDOH Interventions   Food Insecurity Interventions Community Resources Provided, [id number] Referral  Utilities Interventions Community Resources Provided, [id number] Referral  Financial Strain Interventions Community Resources Provided, [facility] Referral        Care guide performed the following interventions: Patient provided with information about care guide support team and interviewed to confirm resource needs. Email: [email or web address] Patient is having financial strain and has concerns for her utility Bill not getting paid and sometimes buying food. Pt requested I email resources and add referral to [facility]   Follow Up Plan:  No further follow up planned at this time. The patient has been provided with needed resources.  Encounter Outcome:  Patient Visit Completed    Lenard Forth [facility]  [facility] Guide, Phone: [phone] Fax:  [phone] Website: [email or web address]

## 2024-01-08 NOTE — Telephone Encounter (Signed)
 Called and spoke with patient, reviewed [name] message. Patient states she is not okay or happy with the fact that [name], NP "went behind her back" to reach out to PCP and discuss this issue with [name]. Patient states "she should have waited on me to decide what I wanted to do because I am my own advocate". Patient specifically requested this relayed to [name].   Scheduled patient appt on 3/27 @12 :20pm.

## 2024-01-08 NOTE — Telephone Encounter (Signed)
Noted. Will discuss during upcoming visit.  

## 2024-01-09 ENCOUNTER — Encounter (HOSPITAL_COMMUNITY): Payer: Self-pay | Admitting: Vascular Surgery

## 2024-01-09 ENCOUNTER — Other Ambulatory Visit: Payer: Self-pay

## 2024-01-09 DIAGNOSIS — Z992 Dependence on renal dialysis: Secondary | ICD-10-CM | POA: Diagnosis not present

## 2024-01-09 DIAGNOSIS — N2581 Secondary hyperparathyroidism of renal origin: Secondary | ICD-10-CM | POA: Diagnosis not present

## 2024-01-09 DIAGNOSIS — N186 End stage renal disease: Secondary | ICD-10-CM | POA: Diagnosis not present

## 2024-01-09 NOTE — Anesthesia Preprocedure Evaluation (Signed)
 Anesthesia Evaluation    Airway        Dental   Pulmonary former smoker          Cardiovascular hypertension,      Neuro/Psych    GI/Hepatic   Endo/Other  diabetes    Renal/GU      Musculoskeletal   Abdominal   Peds  Hematology   Anesthesia Other Findings   Reproductive/Obstetrics                             Anesthesia Physical Anesthesia Plan  ASA:   Anesthesia Plan:    Post-op Pain Management:    Induction:   PONV Risk Score and Plan:   Airway Management Planned:   Additional Equipment:   Intra-op Plan:   Post-operative Plan:   Informed Consent:   Plan Discussed with:   Anesthesia Plan Comments: (PAT note written 01/09/2024 by Shonna Chock, PA-C.  )       Anesthesia Quick Evaluation

## 2024-01-09 NOTE — Progress Notes (Signed)
 SDW CALL  Patient was given pre-op instructions over the phone. The opportunity was given for the patient to ask questions. No further questions asked. Patient verbalized understanding of instructions given.   PCP - Vernona Rieger Cardiologist - denies  Dialysis - patient states that she had dialysis yesterday and will be going again on 3/25 at 1200.    PPM/ICD - denies Device Orders - n/a Rep Notified - n/a  Chest x-ray - 09/27/23 EKG - 09/20/23 Stress Test - 11/03/17 ECHO - 08/25/23 Cardiac Cath - 03/12/20  Sleep Study - denies CPAP - n/a  Fasting Blood Sugar - 89-100 Checks Blood Sugar __3___ times a day  Last dose of GLP1 agonist-  n/a GLP1 instructions: n/a  Blood Thinner Instructions:  n/a Aspirin Instructions: No hold on Aspirin  ERAS Protcol - NPO PRE-SURGERY Ensure or G2- n/a  COVID TEST- n/a   Anesthesia review: yes  Patient denies shortness of breath, fever, cough and chest pain over the phone call   All instructions explained to the patient, with a verbal understanding of the material. Patient agrees to go over the instructions while at home for a better understanding.     Teach back method used to review arrival time.

## 2024-01-09 NOTE — Progress Notes (Signed)
 Anesthesia Chart Review: SAME DAY WORK-UP  Case: [id number] Date/Time: 01/10/24 1102   Procedures:      LAPAROSCOPY, DIAGNOSTIC     REPOSITIONING, CATHETER, CAPD, LAPAROSCOPIC   Anesthesia type: Monitor Anesthesia Care   Diagnosis: Peritoneal dialysis catheter dysfunction, subsequent encounter (HCC) [T85.611D]   Pre-op diagnosis: PERITONEAL DIALYSIS CATHETER DYSFUNCTION   [facility] 12 / [facility]   Surgeons: [name], MD       DISCUSSION: Patient is a 58 year old female scheduled for the above procedure. She was referred to [facility] by nephrologist Dr. Thedore Mins for consideration of PD catheter placement. As of 01/04/24, she had a working right internal jugular TDC. She is s/p laparoscopic PD catheter placement on 12/21/23. Her HD center has been unable to flush the catheter. Xray showed the catheter coiled upon itself with the RLQ region.    History includes former smoker (quit 10/17/17), type B aortic dissection (s/p TEVAR 02/02/23; s/p median sternotomy for aortic arch debranching with aorta-innominate artery,  aorta-left carotid artery and aorta-left subclavian artery bypasses, TEVAR 08/25/23, complicated by acute DVT left internal jugular, left brachial, left, axillary, and left subclavian veins 08/29/23), HTN, HLD, DM2, ESRD (started HD 01/31/23; right internal jugular TDC 01/30/23; HD MWF), HIV, Graves disease' (s/p PTU, thyroidectomy 09/15/08;  01/12/17 left cervical LN biopsy favored metastatic papillary thyroid carcinoma; s/p completion left thyroidectomy with limited left neck dissection 04/12/17 with benign pathology), breast cancer (Stage 1B right breast, s/p lumpectomy 10/03/18, chemoradiation; Port-a-cath 05/17/18-07/16/20), anemia, GERD, asthma, hidradenitis suppurativa (s/p I&D right breast/axilla 01/31/12), 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 Normal coronaries in May 2021.   She had normal coronaries by [facility] on 03/12/20. 01/30/23 echo showed LVEF 55-60%, no regional wall motion abnormalities, mild concentric  LVH, grade II diastolic dysfunction, normal RV systolic function, mild-moderate MR, abdominal aorta flap also noted concerning for dissection (s/p TEVAR 02/02/23). Left BBB noted on 07/11/23 EKG. "BBB" was noted on telemetry strips documentation from 01/23/23.  .    She is a same day workup, so lab and anesthesia team evaluation on the day of surgery.    VS: LMP 03/31/2014 (LMP Unknown)  BP Readings from Last 3 Encounters:  01/04/24 96/62  12/21/23 122/77  12/14/23 95/65   Pulse Readings from Last 3 Encounters:  01/04/24 92  12/21/23 74  12/14/23 88    PROVIDERS: Doreene Nest, NP is PCP  - Gerarda Fraction, MD is vascular surgeon - Evelene Croon, MD is CT surgeon. As needed follow-up at 09/27/23 visit.  - She was previously followed by HF cardiologist Arvilla Meres, MD at the [facility], last visit 08/31/21 for EF monitoring during chemotherapy.  EF 55-60% by 02/2020 echo. Normal coronaries in 2021. As needed follow-up recommended.  Dorothy Puffer, MD is RAD-ONC Rachel Moulds, MD is HEM-ONC - Cliffton Asters, MD is ID - Zetta Bills, MD or Anthony Sar, MD are her nephrologists - Romero Belling, MD is endocrinologist. Last visit 11/02/21.      LABS: For day of surgery. Per labs at [facility] (see CE), H/H 10.5/31.5% 12/27/23 and A1c 5.6% on 09/06/23.     IMAGES: 1V Abd Xray 12/26/23: IMPRESSION: Peritoneal catheter seen coiled upon itself within the right lower quadrant region.  CXR 09/27/23: FINDINGS: Stable cardiomediastinal silhouette. Stable stent graft seen involving descending thoracic aorta. Right internal jugular dialysis catheter is unchanged. Mild right basilar subsegmental atelectasis is noted. Minimal left basilar scarring or subsegmental atelectasis is noted. Bony thorax is unremarkable. IMPRESSION: Mild right basilar subsegmental atelectasis  is noted. Minimal left basilar scarring or subsegmental atelectasis is noted.   CTA Chest/abd/pelvis  09/15/23: IMPRESSION: 1. Interval placement of a second thoracic aortic graft. Previously identified ulceration along the inferior margin of the aortic arch is no longer seen. 2. 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 3. Stable dissection of the thoracic and abdominal aorta. Dissection extends into the left common iliac artery and left external iliac artery, unchanged. 4. New rounded hypodensity in the tail of the pancreas which is too small to characterize. Recommend follow-up MRI in 6 months. 5. Nonenlarged mediastinal lymph nodes have increased in size and number. 6. Nonobstructing right renal calculi. 7. New sternotomy with mild amount of underlying mediastinal edema. No drainable fluid collection. 8. Left chest wall edema.     EKG: EKG 09/15/23: Sinus rhythm Probable left atrial enlargement Left bundle branch block Confirmed by Benjiman Core [phone]) on 09/15/2023 5:41:54 PM   EKG 08/23/23: Normal sinus rhythm ST & Marked T wave abnormality, consider anterolateral ischemia Abnormal ECG   EKG 07/11/23: Normal sinus rhythm Left axis deviation Left bundle branch block Abnormal ECG When compared with ECG of 05-Feb-2023 08:28, LBBB now seen Confirmed by Alverda Skeans ([id number]) on 07/11/2023 3:47:27 PM      CV: ABIs 12/14/23: Summary:  - Right: Resting right ankle-brachial index is within normal range. The  right toe-brachial index is normal.  - Left: Resting left ankle-brachial index is within normal range. The left  toe-brachial index is normal.      UE Venous US 12/14/23: Summary:  Right:  No evidence of thrombosis in the internal jugular vein.  Left:  No evidence of acute deep vein thrombosis in the upper extremity. No  evidence of  acute superficial vein thrombosis in the upper extremity.       US Carotid 07/11/23: Summary:  - Right Carotid: Velocities in the right ICA are consistent with a 1-39% stenosis.  - Left Carotid:  Velocities in the left ICA are consistent with a 1-39% stenosis.  - Vertebrals: Bilateral vertebral arteries demonstrate antegrade flow.  - Subclavians: Normal flow hemodynamics were seen in bilateral subclavian arteries.      Echo 01/30/23: IMPRESSIONS   1. Left ventricular ejection fraction, by estimation, is 55 to 60%. The  left ventricle has normal function. The left ventricle has no regional  wall motion abnormalities. There is mild concentric left ventricular  hypertrophy. Left ventricular diastolic  parameters are consistent with Grade II diastolic dysfunction  (pseudonormalization).   2. Right ventricular systolic function is normal. The right ventricular  size is normal. Tricuspid regurgitation signal is inadequate for assessing  PA pressure.   3. Left atrial size was mildly dilated.   4. The mitral valve is normal in structure. Mild to moderate mitral valve  regurgitation. No evidence of mitral stenosis.   5. The aortic valve is calcified. Aortic valve regurgitation is not  visualized. Aortic valve sclerosis/calcification is present, without any  evidence of aortic stenosis.   6. There appears a flap in the abdominal aorta concerning for abdominal  aortic dissection - would recommend further testing with Abdominal CTA or  MRA.   7. The inferior vena cava is dilated in size with <50% respiratory  variability, suggesting right atrial pressure of 15 mmHg.      RHC/LHC 03/12/20: Findings: Ao = 175/101 (131) LV = 175/11 RA = 6 RV = 40/11 PA = 41/12 (26) PCW = 10 Fick cardiac output/index = 5.1/2.4 PVR = 3.2 WU Ao sat =  96% PA sat = 64%, 65%   Assessment: 1. Normal coronary arteries 2. LVEF 60-65% 3. Mild PAH likely due to OSA/OHS 4. Otherwise normal hemodynamics 4. Severe systemic HTN   Plan/Discussion: Medical therapy with focus on HTN control and weight loss.    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  Genes Analyzed (43 total): APC*, ATM*, AXIN2, BARD1, BMPR1A, BRCA1*, BRCA2*, BRIP1*, CDH1*, CDK4, CDKN2A, CHEK2*, DICER1, GALNT12, HOXB13, MEN1, MLH1*, MRE11A, MSH2*, MSH3, MSH6*, MUTYH*, NBN, NF1*, NTHL1, PALB2*, PMS2*, POLD1, POLE, PTEN*, RAD50, RAD51C*, RAD51D*, RET, SDHB, SDHD, SMAD4, SMARCA4, STK11 and TP53* (sequencing and deletion/duplication); EGFR (s   GERD 07/24/2006   GRAVE'S DISEASE 01/01/2008   History of hidradenitis suppurativa    History of kidney stones    History of thrush    HIV DISEASE 07/24/2006   dx March 05   Hyperlipidemia    HYPERTENSION 07/24/2006   Hyperthyroidism 08/2006   Grave's Disease -diffuse radiotracer uptake 08/25/06 Thyroid scan-Cold nodule to R lower lobe of thyrorid   Ileus (HCC) 02/14/2023   Left bundle branch block (LBBB)    Menometrorrhagia    hx of   Nephrolithiasis    Panniculitis 05/12/2020   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HCC)    METASTATIC PAPILLARY THYROID CARCINOMA per 01/12/17 FNA left cervical LN; s/p completion thyroidectomy, limited left neck dissection 04/12/17  with pathology negative for malignancy.   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 Completed 2 yrs Prednisone after lung bx confirmation. No symptoms since then.   Sebaceous cyst 04/21/2020   Suppurative hidradenitis    Thyroid cancer (HCC)    THYROID NODULE, RIGHT 02/08/2007    Past Surgical History:  Procedure Laterality Date   AORTIC ARCH DEBRANCHING N/A 08/25/2023   Procedure: AORTIC ARCH DEBRANCHING USING 12X8X8MM HEMASHIELD GRAFT;  Surgeon: [name], MD;  [facility];  Service: Open Heart Surgery;  Laterality: N/A;  with bypasses to the innominate and left common carotid arteries   APPLICATION OF WOUND VAC N/A 01/20/2021   Procedure: APPLICATION OF WOUND VAC;  Surgeon: Peggye Form, DO;  Location: [facility];  Service: Plastics;  Laterality: N/A;   BREAST EXCISIONAL  BIOPSY Right 04/26/2018   right axilla negative   BREAST EXCISIONAL BIOPSY Left 04/26/2018   left axilla negative   BREAST LUMPECTOMY Right 10/03/2018   malignant   BREAST LUMPECTOMY WITH RADIOACTIVE SEED AND SENTINEL LYMPH NODE BIOPSY Right 10/03/2018   Procedure: RIGHT BREAST LUMPECTOMY WITH RADIOACTIVE SEED AND SENTINEL LYMPH NODE MAPPING;  Surgeon: Harriette Bouillon, MD;  [facility];  Service: General;  Laterality: Right;   BREAST SURGERY  1997   Breast Reduction    CAPD INSERTION N/A 12/21/2023   Procedure: LAPAROSCOPIC INSERTION CONTINUOUS AMBULATORY PERITONEAL DIALYSIS  (CAPD) CATHETER; LAPAROSCOPIC OMENTUMPEXY;  Surgeon: [name], MD;  [facility];  Service: Vascular;  Laterality: N/A;   CYSTOSCOPY W/ URETERAL STENT REMOVAL  11/09/2012   Procedure: CYSTOSCOPY WITH STENT  REMOVAL;  Surgeon: Sebastian Ache, MD;  Location: [facility];  Service: Urology;  Laterality: Right;   CYSTOSCOPY WITH RETROGRADE PYELOGRAM, URETEROSCOPY AND STENT PLACEMENT  11/09/2012   Procedure: CYSTOSCOPY WITH RETROGRADE PYELOGRAM, URETEROSCOPY AND STENT PLACEMENT;  Surgeon: Sebastian Ache, MD;  Location: [facility];  Service: Urology;  Laterality: Left;  LEFT URETEROSCOPY, STONE MANIPULATION, left STENT exchange    CYSTOSCOPY WITH STENT PLACEMENT  10/02/2012   Procedure: CYSTOSCOPY WITH STENT PLACEMENT;  Surgeon: Sebastian Ache, MD;  [facility];  Service: Urology;  Laterality: Left;   DEBRIDEMENT AND CLOSURE WOUND N/A 01/20/2021   Procedure: Excision of abdominal wound with closure;  Surgeon: Peggye Form, DO;  Location: [facility];  Service: Plastics;  Laterality: N/A;   DILATION AND CURETTAGE OF UTERUS  11/2002   s/p for 1st trimester nonviable pregnancy   EYE SURGERY     sty under eyelid   INCISE AND DRAIN ABCESS  08/2002   s/p I &D for righ inframmary fold hidradenitis   INCISION AND DRAINAGE PERITONSILLAR ABSCESS  12/2001   IR CV LINE INJECTION  06/07/2018   IR FLUORO GUIDE CV LINE RIGHT  01/30/2023   IR IMAGING GUIDED PORT INSERTION  06/20/2018   IR REMOVAL TUN ACCESS W/ PORT W/O FL MOD SED  06/20/2018   IR US GUIDE VASC ACCESS RIGHT  01/30/2023   IRRIGATION AND DEBRIDEMENT ABSCESS  01/31/2012   Procedure: IRRIGATION AND DEBRIDEMENT ABSCESS;  Surgeon: Kandis Cocking, MD;  [facility];  Service: General;  Laterality: Right;  right breast and axilla    NEPHROLITHOTOMY  10/02/2012   Procedure: NEPHROLITHOTOMY PERCUTANEOUS;  Surgeon: Sebastian Ache, MD;  [facility];  Service: Urology;  Laterality: Right;  First Stage Percutaneous Nephrolithotomy with Surgeon Access, Left Ureteral Stent     NEPHROLITHOTOMY  10/04/2012   Procedure: NEPHROLITHOTOMY PERCUTANEOUS SECOND LOOK;  Surgeon: Sebastian Ache, MD;  [facility];  Service: Urology;  Laterality:  Right;      NEPHROLITHOTOMY  10/08/2012   Procedure: NEPHROLITHOTOMY PERCUTANEOUS;  Surgeon: Sebastian Ache, MD;  [facility];  Service: Urology;  Laterality: Right;  THIRD STAGE, nephrostomy tube exchange x 2   NEPHROLITHOTOMY  10/11/2012   Procedure: NEPHROLITHOTOMY PERCUTANEOUS SECOND LOOK;  Surgeon: Sebastian Ache, MD;  [facility];  Service: Urology;  Laterality: Right;  RIGHT 4 STAGE PERCUTANOUS NEPHROLITHOTOMY, right URETEROSCOPY WITH HOLMIUM LASER    PANNICULECTOMY N/A 12/21/2020   Procedure: PANNICULECTOMY;  Surgeon: Peggye Form, DO;  [facility];  Service: Plastics;  Laterality: N/A;   PERCUTANEOUS NEPHROSTOLITHOTOMY  04/2022   PORT-A-CATH REMOVAL N/A 07/16/2020   Procedure: REMOVAL PORT-A-CATH;  Surgeon: Harriette Bouillon, MD;  Location: [facility];  Service: General;  Laterality: N/A;   PORTACATH PLACEMENT Left 05/17/2018   Procedure: INSERTION PORT-A-CATH;  Surgeon: Abigail Miyamoto, MD;  Location: [facility];  Service: General;  Laterality: Left;   RADICAL NECK DISSECTION  04/12/2017   limited/notes 04/12/2017   RADICAL NECK DISSECTION N/A 04/12/2017   Procedure: RADICAL NECK DISSECTION;  Surgeon: Christia Reading, MD;  Location: [facility] OR;  Service: ENT;  Laterality: N/A;  limited neck dissection 2 hours total   REDUCTION MAMMAPLASTY Bilateral 1998   RIGHT/LEFT HEART CATH AND CORONARY ANGIOGRAPHY N/A 03/12/2020   Procedure: RIGHT/LEFT HEART CATH AND CORONARY ANGIOGRAPHY;  Surgeon: Dolores Patty, MD;  [facility];  Service: Cardiovascular;  Laterality: N/A;   [place]  1994   TEE WITHOUT CARDIOVERSION N/A 08/25/2023   Procedure: TRANSESOPHAGEAL ECHOCARDIOGRAM;  Surgeon: [name], MD;  [facility];  Service: Open Heart Surgery;  Laterality: N/A;   THORACIC AORTIC ENDOVASCULAR STENT GRAFT N/A 02/02/2023   Procedure: THORACIC AORTIC ENDOVASCULAR STENT GRAFT;  Surgeon: Victorino Sparrow, MD;  Location: [facility] OR;   Service: Vascular;  Laterality: N/A;   THORACIC AORTIC ENDOVASCULAR STENT GRAFT N/A 08/25/2023   Procedure: THORACIC AORTIC ENDOVASCULAR STENT GRAFT;  Surgeon: Victorino Sparrow, MD;  Location: [facility] OR;  Service: Vascular;  Laterality: N/A;   THYROIDECTOMY  04/12/2017   completion/notes 04/12/2017   THYROIDECTOMY N/A 04/12/2017   Procedure: THYROIDECTOMY;  Surgeon: Christia Reading, MD;  Location: [facility] OR;  Service: ENT;  Laterality: N/A;  Completion Thyroidectomy   TOTAL THYROIDECTOMY  2010   ULTRASOUND GUIDANCE FOR VASCULAR ACCESS Right 08/25/2023   Procedure: ULTRASOUND GUIDANCE FOR VASCULAR ACCESS, RIGHT FEMORAL ARTERY;  Surgeon: Victorino Sparrow, MD;  Location: [facility] OR;  Service: Vascular;  Laterality: Right;    MEDICATIONS: No current facility-administered medications for this encounter.    acetaminophen (TYLENOL) 500 MG tablet   albuterol (VENTOLIN HFA) 108 (90 Base) MCG/ACT inhaler   allopurinol (ZYLOPRIM) 100 MG tablet   amLODipine (NORVASC) 10 MG tablet   aspirin EC 81 MG tablet   AURYXIA 1 GM 210 MG(Fe) tablet   carvedilol (COREG) 12.5 MG tablet   dolutegravir ([name]) 50 MG tablet   emtricitabine-tenofovir AF (DESCOVY) 200-25 MG tablet   fluticasone (FLOVENT HFA) 110 MCG/ACT inhaler   Fluticasone Furoate (ARNUITY ELLIPTA) 100 MCG/ACT AEPB   Lancets (ONETOUCH ULTRASOFT) lancets   levocetirizine (XYZAL) 5 MG tablet   levothyroxine (SYNTHROID) 175 MCG tablet   losartan (COZAAR) 50 MG tablet   minocycline (MINOCIN) 100 MG capsule   rosuvastatin (CRESTOR) 10 MG tablet   saxagliptin HCl (ONGLYZA) 2.5 MG TABS tablet   traMADol (ULTRAM) 50 MG tablet   Blood Glucose Monitoring Suppl (FREESTYLE LITE) w/Device KIT   fluconazole (DIFLUCAN) 150 MG tablet   traMADol (ULTRAM) 50 MG tablet    Shonna Chock, PA-C Surgical Short Stay/Anesthesiology [facility] Phone [phone] [facility] Phone [phone] 01/09/2024 9:16 AM

## 2024-01-10 ENCOUNTER — Ambulatory Visit (HOSPITAL_COMMUNITY): Payer: Self-pay | Admitting: Vascular Surgery

## 2024-01-10 ENCOUNTER — Encounter (HOSPITAL_COMMUNITY)
Admission: RE | Disposition: A | Discharge status: Home / Self Care | Payer: Self-pay | Source: Home / Self Care | Attending: Vascular Surgery

## 2024-01-10 ENCOUNTER — Other Ambulatory Visit: Payer: Self-pay

## 2024-01-10 ENCOUNTER — Telehealth: Payer: Self-pay

## 2024-01-10 ENCOUNTER — Ambulatory Visit (HOSPITAL_COMMUNITY)
Admission: RE | Admit: 2024-01-10 | Discharge: 2024-01-10 | Disposition: A | Discharge status: Home / Self Care | Attending: Vascular Surgery | Admitting: Vascular Surgery

## 2024-01-10 ENCOUNTER — Ambulatory Visit (HOSPITAL_BASED_OUTPATIENT_CLINIC_OR_DEPARTMENT_OTHER): Payer: Self-pay | Admitting: Vascular Surgery

## 2024-01-10 ENCOUNTER — Encounter (HOSPITAL_COMMUNITY): Payer: Self-pay | Admitting: Vascular Surgery

## 2024-01-10 DIAGNOSIS — T85611A Breakdown (mechanical) of intraperitoneal dialysis catheter, initial encounter: Secondary | ICD-10-CM | POA: Diagnosis not present

## 2024-01-10 DIAGNOSIS — Z86718 Personal history of other venous thrombosis and embolism: Secondary | ICD-10-CM | POA: Diagnosis not present

## 2024-01-10 DIAGNOSIS — N186 End stage renal disease: Secondary | ICD-10-CM | POA: Insufficient documentation

## 2024-01-10 DIAGNOSIS — B20 Human immunodeficiency virus [HIV] disease: Secondary | ICD-10-CM | POA: Diagnosis not present

## 2024-01-10 DIAGNOSIS — M1A09X0 Idiopathic chronic gout, multiple sites, without tophus (tophi): Secondary | ICD-10-CM

## 2024-01-10 DIAGNOSIS — Z992 Dependence on renal dialysis: Secondary | ICD-10-CM | POA: Insufficient documentation

## 2024-01-10 DIAGNOSIS — I7102 Dissection of abdominal aorta: Secondary | ICD-10-CM | POA: Insufficient documentation

## 2024-01-10 DIAGNOSIS — Z7984 Long term (current) use of oral hypoglycemic drugs: Secondary | ICD-10-CM | POA: Insufficient documentation

## 2024-01-10 DIAGNOSIS — Z803 Family history of malignant neoplasm of breast: Secondary | ICD-10-CM | POA: Insufficient documentation

## 2024-01-10 DIAGNOSIS — Z9889 Other specified postprocedural states: Secondary | ICD-10-CM | POA: Diagnosis not present

## 2024-01-10 DIAGNOSIS — I447 Left bundle-branch block, unspecified: Secondary | ICD-10-CM | POA: Insufficient documentation

## 2024-01-10 DIAGNOSIS — E785 Hyperlipidemia, unspecified: Secondary | ICD-10-CM | POA: Insufficient documentation

## 2024-01-10 DIAGNOSIS — I120 Hypertensive chronic kidney disease with stage 5 chronic kidney disease or end stage renal disease: Secondary | ICD-10-CM

## 2024-01-10 DIAGNOSIS — Z85850 Personal history of malignant neoplasm of thyroid: Secondary | ICD-10-CM | POA: Diagnosis not present

## 2024-01-10 DIAGNOSIS — D631 Anemia in chronic kidney disease: Secondary | ICD-10-CM | POA: Diagnosis not present

## 2024-01-10 DIAGNOSIS — Z9221 Personal history of antineoplastic chemotherapy: Secondary | ICD-10-CM | POA: Diagnosis not present

## 2024-01-10 DIAGNOSIS — E1151 Type 2 diabetes mellitus with diabetic peripheral angiopathy without gangrene: Secondary | ICD-10-CM | POA: Diagnosis not present

## 2024-01-10 DIAGNOSIS — Z923 Personal history of irradiation: Secondary | ICD-10-CM | POA: Diagnosis not present

## 2024-01-10 DIAGNOSIS — N185 Chronic kidney disease, stage 5: Secondary | ICD-10-CM

## 2024-01-10 DIAGNOSIS — Y831 Surgical operation with implant of artificial internal device as the cause of abnormal reaction of the patient, or of later complication, without mention of misadventure at the time of the procedure: Secondary | ICD-10-CM | POA: Insufficient documentation

## 2024-01-10 DIAGNOSIS — M1990 Unspecified osteoarthritis, unspecified site: Secondary | ICD-10-CM | POA: Diagnosis not present

## 2024-01-10 DIAGNOSIS — Z7901 Long term (current) use of anticoagulants: Secondary | ICD-10-CM | POA: Diagnosis not present

## 2024-01-10 DIAGNOSIS — Z833 Family history of diabetes mellitus: Secondary | ICD-10-CM | POA: Insufficient documentation

## 2024-01-10 DIAGNOSIS — E890 Postprocedural hypothyroidism: Secondary | ICD-10-CM | POA: Insufficient documentation

## 2024-01-10 DIAGNOSIS — J45909 Unspecified asthma, uncomplicated: Secondary | ICD-10-CM | POA: Insufficient documentation

## 2024-01-10 DIAGNOSIS — Z87891 Personal history of nicotine dependence: Secondary | ICD-10-CM | POA: Diagnosis not present

## 2024-01-10 DIAGNOSIS — Z8249 Family history of ischemic heart disease and other diseases of the circulatory system: Secondary | ICD-10-CM | POA: Insufficient documentation

## 2024-01-10 DIAGNOSIS — E1122 Type 2 diabetes mellitus with diabetic chronic kidney disease: Secondary | ICD-10-CM | POA: Diagnosis not present

## 2024-01-10 DIAGNOSIS — E0590 Thyrotoxicosis, unspecified without thyrotoxic crisis or storm: Secondary | ICD-10-CM | POA: Insufficient documentation

## 2024-01-10 HISTORY — PX: LAPAROSCOPY: SHX197

## 2024-01-10 HISTORY — PX: LAPAROSCOPIC REPOSITIONING CAPD CATHETER: SHX5920

## 2024-01-10 HISTORY — PX: TENCKHOFF CATHETER INSERTION: SHX5251

## 2024-01-10 LAB — GLUCOSE, CAPILLARY
Glucose-Capillary: 105 mg/dL — ABNORMAL HIGH (ref 70–99)
Glucose-Capillary: 106 mg/dL — ABNORMAL HIGH (ref 70–99)
Glucose-Capillary: 116 mg/dL — ABNORMAL HIGH (ref 70–99)

## 2024-01-10 LAB — POCT I-STAT, CHEM 8
BUN: 40 mg/dL — ABNORMAL HIGH (ref 6–20)
Calcium, Ion: 1.01 mmol/L — ABNORMAL LOW (ref 1.15–1.40)
Chloride: 100 mmol/L (ref 98–111)
Creatinine, Ser: 6 mg/dL — ABNORMAL HIGH (ref 0.44–1.00)
Glucose, Bld: 109 mg/dL — ABNORMAL HIGH (ref 70–99)
HCT: 37 % (ref 36.0–46.0)
Hemoglobin: 12.6 g/dL (ref 12.0–15.0)
Potassium: 4.6 mmol/L (ref 3.5–5.1)
Sodium: 135 mmol/L (ref 135–145)
TCO2: 29 mmol/L (ref 22–32)

## 2024-01-10 SURGERY — LAPAROSCOPY, DIAGNOSTIC
Anesthesia: General | Site: Abdomen

## 2024-01-10 MED ORDER — ONDANSETRON HCL 4 MG/2ML IJ SOLN: 4.0000 mg | Freq: Once | INTRAMUSCULAR | Status: DC | PRN

## 2024-01-10 MED ORDER — ONDANSETRON HCL 4 MG/2ML IJ SOLN
INTRAMUSCULAR | Status: DC | PRN
Start: 2024-01-10 — End: 2024-01-10
  Administered 2024-01-10: 4 mg via INTRAVENOUS

## 2024-01-10 MED ORDER — ACETAMINOPHEN 10 MG/ML IV SOLN
INTRAVENOUS | Status: DC
Start: 2024-01-10 — End: 2024-01-10
  Filled 2024-01-10: qty 100

## 2024-01-10 MED ORDER — DEXAMETHASONE SODIUM PHOSPHATE 10 MG/ML IJ SOLN
INTRAMUSCULAR | Status: DC | PRN
  Administered 2024-01-10: 10 mg via INTRAVENOUS

## 2024-01-10 MED ORDER — CHLORHEXIDINE GLUCONATE 4 % EX SOLN
60.0000 mL | Freq: Once | CUTANEOUS | Status: DC
Start: 2024-01-11 — End: 2024-01-10

## 2024-01-10 MED ORDER — MIDAZOLAM HCL 2 MG/2ML IJ SOLN
INTRAMUSCULAR | Status: DC | PRN
  Administered 2024-01-10: 2 mg via INTRAVENOUS

## 2024-01-10 MED ORDER — FENTANYL CITRATE (PF) 250 MCG/5ML IJ SOLN
INTRAMUSCULAR | Status: AC
  Filled 2024-01-10: qty 5

## 2024-01-10 MED ORDER — SODIUM CHLORIDE 0.9 % IV SOLN
INTRAVENOUS | Status: DC | PRN
Start: 2024-01-10 — End: 2024-01-10

## 2024-01-10 MED ORDER — ALLOPURINOL 100 MG PO TABS: 100.0000 mg | ORAL_TABLET | Freq: Every day | ORAL | 0 refills | Status: DC

## 2024-01-10 MED ORDER — OXYCODONE HCL 5 MG PO TABS
5.0000 mg | ORAL_TABLET | Freq: Once | ORAL | Status: AC | PRN
  Administered 2024-01-10: 5 mg via ORAL

## 2024-01-10 MED ORDER — CEFAZOLIN SODIUM-DEXTROSE 2-4 GM/100ML-% IV SOLN
2.0000 g | INTRAVENOUS | Status: AC
  Administered 2024-01-10: 2 g via INTRAVENOUS
  Filled 2024-01-10: qty 100

## 2024-01-10 MED ORDER — SUGAMMADEX SODIUM 200 MG/2ML IV SOLN
INTRAVENOUS | Status: DC | PRN
  Administered 2024-01-10: 156 mg via INTRAVENOUS

## 2024-01-10 MED ORDER — OXYCODONE HCL 5 MG/5ML PO SOLN: 5.0000 mg | Freq: Once | ORAL | Status: AC | PRN

## 2024-01-10 MED ORDER — ONDANSETRON HCL 4 MG/2ML IJ SOLN
INTRAMUSCULAR | Status: AC
  Administered 2024-01-10: 4 mg via INTRAVENOUS
  Filled 2024-01-10: qty 2

## 2024-01-10 MED ORDER — PROPOFOL 10 MG/ML IV BOLUS
INTRAVENOUS | Status: AC
  Filled 2024-01-10: qty 20

## 2024-01-10 MED ORDER — OXYCODONE HCL 5 MG PO TABS
ORAL_TABLET | ORAL | Status: AC
  Filled 2024-01-10: qty 1

## 2024-01-10 MED ORDER — TRAMADOL HCL 50 MG PO TABS: 50.0000 mg | ORAL_TABLET | Freq: Two times a day (BID) | ORAL | 0 refills | Status: DC | PRN

## 2024-01-10 MED ORDER — CHLORHEXIDINE GLUCONATE 4 % EX SOLN: 60.0000 mL | Freq: Once | CUTANEOUS | Status: DC

## 2024-01-10 MED ORDER — CHLORHEXIDINE GLUCONATE 0.12 % MT SOLN
15.0000 mL | Freq: Once | OROMUCOSAL | Status: AC
  Administered 2024-01-10: 15 mL via OROMUCOSAL
  Filled 2024-01-10: qty 15

## 2024-01-10 MED ORDER — DEXAMETHASONE SODIUM PHOSPHATE 10 MG/ML IJ SOLN
INTRAMUSCULAR | Status: AC
  Filled 2024-01-10: qty 1

## 2024-01-10 MED ORDER — PROPOFOL 10 MG/ML IV BOLUS
INTRAVENOUS | Status: DC | PRN
  Administered 2024-01-10: 100 mg via INTRAVENOUS

## 2024-01-10 MED ORDER — EPHEDRINE 5 MG/ML INJ
INTRAVENOUS | Status: AC
  Filled 2024-01-10: qty 5

## 2024-01-10 MED ORDER — ONDANSETRON HCL 4 MG/2ML IJ SOLN: 4.0000 mg | Freq: Once | INTRAMUSCULAR | Status: AC

## 2024-01-10 MED ORDER — MIDAZOLAM HCL 2 MG/2ML IJ SOLN
INTRAMUSCULAR | Status: AC
  Filled 2024-01-10: qty 2

## 2024-01-10 MED ORDER — ACETAMINOPHEN 10 MG/ML IV SOLN
1000.0000 mg | Freq: Once | INTRAVENOUS | Status: DC | PRN
  Administered 2024-01-10: 1000 mg via INTRAVENOUS

## 2024-01-10 MED ORDER — ONDANSETRON HCL 4 MG/2ML IJ SOLN
INTRAMUSCULAR | Status: AC
  Filled 2024-01-10: qty 2

## 2024-01-10 MED ORDER — FENTANYL CITRATE (PF) 100 MCG/2ML IJ SOLN
INTRAMUSCULAR | Status: AC
  Filled 2024-01-10: qty 2

## 2024-01-10 MED ORDER — SODIUM CHLORIDE 0.9% FLUSH: 3.0000 mL | Freq: Two times a day (BID) | INTRAVENOUS | Status: DC

## 2024-01-10 MED ORDER — LIDOCAINE 2% (20 MG/ML) 5 ML SYRINGE
INTRAMUSCULAR | Status: AC
  Filled 2024-01-10: qty 5

## 2024-01-10 MED ORDER — SODIUM CHLORIDE 0.9 % IR SOLN
Status: DC | PRN
  Administered 2024-01-10: 1000 mL

## 2024-01-10 MED ORDER — LIDOCAINE 2% (20 MG/ML) 5 ML SYRINGE
INTRAMUSCULAR | Status: DC | PRN
Start: 2024-01-10 — End: 2024-01-10
  Administered 2024-01-10: 100 mg via INTRAVENOUS

## 2024-01-10 MED ORDER — FENTANYL CITRATE (PF) 100 MCG/2ML IJ SOLN
25.0000 ug | INTRAMUSCULAR | Status: DC | PRN
  Administered 2024-01-10: 50 ug via INTRAVENOUS
  Administered 2024-01-10: 25 ug via INTRAVENOUS

## 2024-01-10 MED ORDER — ORAL CARE MOUTH RINSE: 15.0000 mL | Freq: Once | OROMUCOSAL | Status: AC

## 2024-01-10 MED ORDER — FENTANYL CITRATE (PF) 250 MCG/5ML IJ SOLN
INTRAMUSCULAR | Status: DC | PRN
  Administered 2024-01-10: 100 ug via INTRAVENOUS

## 2024-01-10 MED ORDER — ROCURONIUM BROMIDE 10 MG/ML (PF) SYRINGE
PREFILLED_SYRINGE | INTRAVENOUS | Status: DC | PRN
  Administered 2024-01-10: 70 mg via INTRAVENOUS

## 2024-01-10 MED ORDER — 0.9 % SODIUM CHLORIDE (POUR BTL) OPTIME
TOPICAL | Status: DC | PRN
  Administered 2024-01-10: 1000 mL

## 2024-01-10 SURGICAL SUPPLY — 45 items
ADAPTER TITANIUM MEDIONICS (MISCELLANEOUS) IMPLANT
BENZOIN TINCTURE PRP APPL 2/3 (GAUZE/BANDAGES/DRESSINGS) ×2 IMPLANT
BIOPATCH RED 1 DISK 7.0 (GAUZE/BANDAGES/DRESSINGS) ×2 IMPLANT
BLADE CLIPPER SURG (BLADE) ×2 IMPLANT
BLADE SURG 11 STRL SS (BLADE) ×2 IMPLANT
CATH EXTENDED DIALYSIS (CATHETERS) IMPLANT
CHLORAPREP W/TINT 26 (MISCELLANEOUS) ×2 IMPLANT
COVER SURGICAL LIGHT HANDLE (MISCELLANEOUS) ×2 IMPLANT
DERMABOND ADVANCED .7 DNX12 (GAUZE/BANDAGES/DRESSINGS) IMPLANT
DRSG IV TEGADERM 3.5X4.5 STRL (GAUZE/BANDAGES/DRESSINGS) ×2 IMPLANT
ELECT REM PT RETURN 9FT ADLT (ELECTROSURGICAL) ×2 IMPLANT
ELECTRODE REM PT RTRN 9FT ADLT (ELECTROSURGICAL) ×2 IMPLANT
GAUZE SPONGE 4X4 12PLY STRL (GAUZE/BANDAGES/DRESSINGS) ×2 IMPLANT
GLOVE BIO SURGEON STRL SZ8 (GLOVE) ×2 IMPLANT
GOWN STRL REUS W/ TWL LRG LVL3 (GOWN DISPOSABLE) ×2 IMPLANT
GOWN STRL REUS W/ TWL XL LVL3 (GOWN DISPOSABLE) ×2 IMPLANT
GRASPER SUT TROCAR 14GX15 (MISCELLANEOUS) ×2 IMPLANT
IV NS 1000ML BAXH (IV SOLUTION) ×2 IMPLANT
KIT BASIN OR (CUSTOM PROCEDURE TRAY) ×2 IMPLANT
KIT TURNOVER KIT B (KITS) ×2 IMPLANT
NDL INSUFFLATION 14GA 120MM (NEEDLE) ×2 IMPLANT
NEEDLE INSUFFLATION 14GA 120MM (NEEDLE) ×2 IMPLANT
NS IRRIG 1000ML POUR BTL (IV SOLUTION) ×2 IMPLANT
PAD ARMBOARD POSITIONER FOAM (MISCELLANEOUS) ×2 IMPLANT
PENCIL SMOKE EVACUATOR (MISCELLANEOUS) ×2 IMPLANT
SCISSORS LAP 5X35 DISP (ENDOMECHANICALS) IMPLANT
SET CYSTO W/LG BORE CLAMP LF (SET/KITS/TRAYS/PACK) IMPLANT
SPIKE FLUID TRANSFER (MISCELLANEOUS) ×2 IMPLANT
STRIP CLOSURE SKIN 1/2X4 (GAUZE/BANDAGES/DRESSINGS) ×4 IMPLANT
STYLET FALLER (MISCELLANEOUS) IMPLANT
SUT MNCRL AB 4-0 PS2 18 (SUTURE) ×2 IMPLANT
SUT PROLENE 0 SH 30 (SUTURE) IMPLANT
SUT PROLENE 1 XLH 60 (SUTURE) IMPLANT
SUT VIC AB 3-0 SH 27X BRD (SUTURE) ×2 IMPLANT
SYR 20CC LL (SYRINGE) ×2 IMPLANT
TAPE CLOTH SURG 4X10 WHT LF (GAUZE/BANDAGES/DRESSINGS) IMPLANT
TOWEL GREEN STERILE (TOWEL DISPOSABLE) ×2 IMPLANT
TOWEL GREEN STERILE FF (TOWEL DISPOSABLE) ×2 IMPLANT
TRAY LAPAROSCOPIC MC (CUSTOM PROCEDURE TRAY) ×2 IMPLANT
TROCAR 5MMX150MM (TROCAR) ×2 IMPLANT
TROCAR XCEL BLADELESS 5X75MML (TROCAR) ×2 IMPLANT
TROCAR Z THREAD OPTICAL 5X150 (TROCAR) IMPLANT
TUBE CONNECTING 20X1/4 (TUBING) IMPLANT
TUBING EVAC SMOKE HEATED PNEUM (TUBING) IMPLANT
WATER STERILE IRR 1000ML POUR (IV SOLUTION) ×2 IMPLANT

## 2024-01-10 NOTE — Anesthesia Procedure Notes (Signed)
 Procedure Name: Intubation Date/Time: 01/10/2024 11:24 AM  Performed by: Einar Grad, CRNAPre-anesthesia Checklist: Patient identified, Emergency Drugs available, Suction available and Patient being monitored Patient Re-evaluated:Patient Re-evaluated prior to induction Oxygen Delivery Method: Circle System Utilized Preoxygenation: Pre-oxygenation with 100% oxygen Induction Type: IV induction Ventilation: Mask ventilation without difficulty Laryngoscope Size: Glidescope and 3 Grade View: Grade II Tube type: Oral Tube size: 7.0 mm Number of attempts: 1 Airway Equipment and Method: Stylet and Oral airway Placement Confirmation: ETT inserted through vocal cords under direct vision, positive ETCO2 and breath sounds checked- equal and bilateral Secured at: 22 cm Tube secured with: Tape Dental Injury: Teeth and Oropharynx as per pre-operative assessment

## 2024-01-10 NOTE — Telephone Encounter (Signed)
 Left message advising patient of Isabella Stalling message.

## 2024-01-10 NOTE — Telephone Encounter (Signed)
 Copied from CRM [id number]. Topic: Clinical - Medication Question >> Jan 10, 2024 10:34 AM Kathryne Eriksson wrote: Reason for CRM: allopurinol (ZYLOPRIM) 100 MG tablet >> Jan 10, 2024 10:35 AM Kathryne Eriksson wrote: Patient is wanting to know why her medication was denied. Patient call back number is [phone]

## 2024-01-10 NOTE — Telephone Encounter (Signed)
 Please call patient:  I did not deny her allopurinol prescription, but I will refill it now.

## 2024-01-10 NOTE — Progress Notes (Signed)
 Fentanyl wasted in stericycle with Veva Holes, RN.

## 2024-01-10 NOTE — Interval H&P Note (Signed)
 History and Physical Interval Note:  01/10/2024 10:40 AM  [name]  has presented today for surgery, with the diagnosis of PERITONEAL DIALYSIS CATHETER DYSFUNCTION.  The various methods of treatment have been discussed with the patient and family. After consideration of risks, benefits and other options for treatment, the patient has consented to  Procedure(s): LAPAROSCOPY, DIAGNOSTIC (N/A) REPOSITIONING, CATHETER, CAPD, LAPAROSCOPIC (N/A) as a surgical intervention.  The patient's history has been reviewed, patient examined, no change in status, stable for surgery.  I have reviewed the patient's chart and labs.  Questions were answered to the patient's satisfaction.     [name]

## 2024-01-10 NOTE — Addendum Note (Signed)
 Addended by: Doreene Nest on: 01/10/2024 02:51 PM   Modules accepted: Orders

## 2024-01-10 NOTE — Anesthesia Postprocedure Evaluation (Signed)
 Anesthesia Post Note  Patient: Natavia Cornelia Malanga  Procedure(s) Performed: LAPAROSCOPY, DIAGNOSTIC (Abdomen) Removal of peritoneal dialysis Catheter. (Left: Abdomen) INSERTION, CATHETER, DIALYSIS, PERITONEAL (Left: Abdomen)     Patient location during evaluation: PACU Anesthesia Type: General Level of consciousness: awake and alert Pain management: pain level controlled Vital Signs Assessment: post-procedure vital signs reviewed and stable Respiratory status: spontaneous breathing, nonlabored ventilation, respiratory function stable and patient connected to nasal cannula oxygen Cardiovascular status: blood pressure returned to baseline and stable Postop Assessment: no apparent nausea or vomiting Anesthetic complications: no   No notable events documented.  Last Vitals:  Vitals:   01/10/24 1330 01/10/24 1340  BP: 138/66 (!) 143/71  Pulse: 63 69  Resp: 18 15  Temp:  37 C  SpO2: 93% 95%    Last Pain:  Vitals:   01/10/24 1241  PainSc: 0-No pain                 Mariann Barter

## 2024-01-10 NOTE — Transfer of Care (Signed)
 Immediate Anesthesia Transfer of Care Note  Patient: [name]  Procedure(s) Performed: LAPAROSCOPY, DIAGNOSTIC (Abdomen) Removal of peritoneal dialysis Catheter. (Left: Abdomen) INSERTION, CATHETER, DIALYSIS, PERITONEAL (Left: Abdomen)  Patient Location: PACU  Anesthesia Type:General  Level of Consciousness: drowsy and patient cooperative  Airway & Oxygen Therapy: Patient Spontanous Breathing  Post-op Assessment: Report given to RN and Post -op Vital signs reviewed and stable  Post vital signs: Reviewed and stable  Last Vitals:  Vitals Value Taken Time  BP 165/92 01/10/24 1241  Temp    Pulse 72 01/10/24 1241  Resp 19 01/10/24 1242  SpO2 96 % 01/10/24 1241  Vitals shown include unfiled device data.  Last Pain:  Vitals:   01/10/24 0907  PainSc: 0-No pain      Patients Stated Pain Goal: 0 (01/10/24 0907)  Complications: No notable events documented.

## 2024-01-10 NOTE — Op Note (Signed)
 DATE OF SERVICE: 01/10/2024  PATIENT:  [name]  58 y.o. female  PRE-OPERATIVE DIAGNOSIS:  ESRD  POST-OPERATIVE DIAGNOSIS:  Same  PROCEDURE:   1) diagnostic laparoscopy 2) removal of peritoneal dialysis catheter 3) placement of new peritoneal dialysis catheter  SURGEON:  Surgeons and Role:    * [name], MD - Primary  ASSISTANT: Aggie Moats, PA-C  An experienced assistant was required given the complexity of this procedure and the standard of surgical care. My assistant helped with exposure through counter tension, suctioning, ligation and retraction to better visualize the surgical field.  My assistant expedited sewing during the case by following my sutures. Wherever I use the term "we" in the report, my assistant actively helped me with that portion of the procedure.  ANESTHESIA:   general  EBL: 50mL  BLOOD ADMINISTERED:none  DRAINS: none   LOCAL MEDICATIONS USED:  NONE  SPECIMEN:  none  COUNTS: confirmed correct.  TOURNIQUET:  none  PATIENT DISPOSITION:  PACU - hemodynamically stable.   Delay start of Pharmacological VTE agent (>24hrs) due to surgical blood loss or risk of bleeding: no  INDICATION FOR PROCEDURE: [name] is a 58 y.o. female with ESRD in need of dialysis access.  I previously placed a peritoneal dialysis catheter for her which is not functioning.  A plain radiograph shows the catheter is "flipped" into the abdomen instead of staying in the pelvis.. After careful discussion of risks, benefits, and alternatives the patient was offered diagnostic laparoscopy. The patient understood and wished to proceed.  OPERATIVE FINDINGS: Catheter likely to redundant, making it prone to dislodging from pelvis.  The intra-abdominal portion of the catheter was removed and replaced with a new catheter which I tunneled more deeply in the pelvis.  The catheter was also shortened to prevent chance of the catheter flipping out of the pelvis.   The catheter was tested at the end of the case and found to function well.  DESCRIPTION OF PROCEDURE: After identification of the patient in the pre-operative holding area, the patient was transferred to the operating room. The patient was positioned supine on the operating room table. Anesthesia was induced. The abdomen was prepped and draped in standard fashion. A surgical pause was performed confirming correct patient, procedure, and operative location.  A Veress needle was introduced into the abdomen at Palmer's point, immediately below the left costal margin.  A saline drop test was used to confirm intra-abdominal position.  The Veress needle was connected to insufflation tubing and insufflation initiated.  A low opening pressure and good flow rate were noted.  A 5 mm trocar was introduced into the right upper quadrant using Visi-View technique.  The obturator was removed and the abdomen inspected with a 30 degree angled laparoscope.  No evidence of Veress needle injury was noted.  An additional 5 mm trocar was inserted a handsbreadth away to facilitate the case.  The abdomen was inspected with the laparoscope.  The catheter was found to be redundant and stuck between loops of bowel in the abdomen.  The catheter was untangled and found to have a fibrin sheath within it.  The catheter was flushed fairly easily.  I tried to position a loop of Prolene around the catheter to tack it to the pelvis, but this was not going to be effective.  I felt the best course of action would be to shorten the catheter.  A transverse incision was made over the cuff in the suprapubic abdomen.  The cuff  was freed and the intra-abdominal portion of the catheter removed.  A new pigtail catheter was delivered through the abdominal wall using a long 5 mm trocar.  The catheter was positioned in the pelvis without undue redundancy and cut to length to connect to the Crispin straight after.  Once this was done, the catheter was sewn  to the Christmas tree adapter using a 0 Prolene suture.  The abdomen was reinsufflated.  The peritoneum was inspected to ensure the catheter did not enter the cavity.  Satisfied we desufflated the abdomen.  The catheter was connected to cystoscopy tubing.  The catheter flushed and drained without any difficulty.  The trochars were removed.  All incisions were closed with interrupted 4-0 Monocryl sutures.  Dermabond was applied.  A sterile bandage was applied to the peritoneal dialysis catheter.   Upon completion of the case instrument and sharps counts were confirmed correct. The patient was transferred to the PACU in good condition. I was present for all portions of the procedure.  FOLLOW UP PLAN: Follow-up with me as needed.  Rande Brunt. Lenell Antu, MD [facility] Phone Number: [phone] 01/10/2024 12:40 PM

## 2024-01-10 NOTE — Progress Notes (Signed)
 Adding [facility] REFERRALS

## 2024-01-11 ENCOUNTER — Encounter: Payer: Self-pay | Admitting: Primary Care

## 2024-01-11 ENCOUNTER — Other Ambulatory Visit (HOSPITAL_COMMUNITY)
Admission: RE | Admit: 2024-01-11 | Discharge: 2024-01-11 | Disposition: A | Discharge status: Home / Self Care | Source: Ambulatory Visit | Attending: Primary Care | Admitting: Primary Care

## 2024-01-11 ENCOUNTER — Ambulatory Visit (INDEPENDENT_AMBULATORY_CARE_PROVIDER_SITE_OTHER): Admitting: Primary Care

## 2024-01-11 VITALS — BP 130/78 | HR 76 | Temp 98.1°F | Ht 65.0 in | Wt 183.0 lb

## 2024-01-11 DIAGNOSIS — N186 End stage renal disease: Secondary | ICD-10-CM

## 2024-01-11 DIAGNOSIS — E1169 Type 2 diabetes mellitus with other specified complication: Secondary | ICD-10-CM | POA: Diagnosis not present

## 2024-01-11 DIAGNOSIS — Z1151 Encounter for screening for human papillomavirus (HPV): Secondary | ICD-10-CM | POA: Diagnosis not present

## 2024-01-11 DIAGNOSIS — Z01419 Encounter for gynecological examination (general) (routine) without abnormal findings: Secondary | ICD-10-CM | POA: Diagnosis not present

## 2024-01-11 DIAGNOSIS — I10 Essential (primary) hypertension: Secondary | ICD-10-CM

## 2024-01-11 DIAGNOSIS — K219 Gastro-esophageal reflux disease without esophagitis: Secondary | ICD-10-CM

## 2024-01-11 DIAGNOSIS — M1A09X0 Idiopathic chronic gout, multiple sites, without tophus (tophi): Secondary | ICD-10-CM | POA: Diagnosis not present

## 2024-01-11 DIAGNOSIS — Z21 Asymptomatic human immunodeficiency virus [HIV] infection status: Secondary | ICD-10-CM

## 2024-01-11 DIAGNOSIS — Z170 Estrogen receptor positive status [ER+]: Secondary | ICD-10-CM

## 2024-01-11 DIAGNOSIS — Z124 Encounter for screening for malignant neoplasm of cervix: Secondary | ICD-10-CM

## 2024-01-11 DIAGNOSIS — G8929 Other chronic pain: Secondary | ICD-10-CM

## 2024-01-11 DIAGNOSIS — F411 Generalized anxiety disorder: Secondary | ICD-10-CM

## 2024-01-11 DIAGNOSIS — E039 Hypothyroidism, unspecified: Secondary | ICD-10-CM

## 2024-01-11 DIAGNOSIS — E785 Hyperlipidemia, unspecified: Secondary | ICD-10-CM

## 2024-01-11 DIAGNOSIS — M545 Low back pain, unspecified: Secondary | ICD-10-CM | POA: Diagnosis not present

## 2024-01-11 DIAGNOSIS — J4530 Mild persistent asthma, uncomplicated: Secondary | ICD-10-CM

## 2024-01-11 DIAGNOSIS — Z0000 Encounter for general adult medical examination without abnormal findings: Secondary | ICD-10-CM

## 2024-01-11 DIAGNOSIS — L732 Hidradenitis suppurativa: Secondary | ICD-10-CM | POA: Diagnosis not present

## 2024-01-11 DIAGNOSIS — F330 Major depressive disorder, recurrent, mild: Secondary | ICD-10-CM

## 2024-01-11 LAB — POCT GLYCOSYLATED HEMOGLOBIN (HGB A1C): Hemoglobin A1C: 5.8 % — AB (ref 4.0–5.6)

## 2024-01-11 MED ORDER — SAXAGLIPTIN HCL 2.5 MG PO TABS: 2.5000 mg | ORAL_TABLET | Freq: Every day | ORAL | 1 refills | Status: DC

## 2024-01-11 NOTE — Assessment & Plan Note (Signed)
 Unclear if long term tetracycline treatment is best practice, however it has worked effectively for about 10 years. Will consult with infectious disease regarding once daily use of minocycline.   Continue minocycline 100 mg once daily for now.

## 2024-01-11 NOTE — Patient Instructions (Addendum)
 Hold your rosuvastatin (Crestor) cholesterol pill for 2 weeks to see if this helps with your leg symptoms.   Stop by the lab prior to leaving today. I will notify you of your results once received.   Complete your Shingles vaccines at the pharmacy.  Find a dentist.   [facility].   Please schedule a follow up visit for 6 months for a diabetes check.  It was a pleasure to see you today!

## 2024-01-11 NOTE — Assessment & Plan Note (Signed)
 Follow with vascular services and nephrology. Peritoneal dialysis catheter in place, plan is to switch to peritoneal dialysis in the future.  Review nephrology notes from March 2025 through [facility]. Reviewed vascular surgery notes from yesterday

## 2024-01-11 NOTE — Assessment & Plan Note (Signed)
 Controlled.  Continue amlodipine 10 mg daily, carvedilol 12.5 mg twice daily, losartan 50 mg daily.

## 2024-01-11 NOTE — Assessment & Plan Note (Signed)
 Improved. Continue to monitor.

## 2024-01-11 NOTE — Assessment & Plan Note (Signed)
No concerns today. Remain off treatment.

## 2024-01-11 NOTE — Assessment & Plan Note (Signed)
 Stable.  No concerns today. Continue to monitor.

## 2024-01-11 NOTE — Assessment & Plan Note (Signed)
 Discussed Shingrix vaccines, she will obtain these from the pharmacy. Pap smear due, completed today Mammogram due, she will call to schedule Colonoscopy UTD, due 2030  Discussed the importance of a healthy diet and regular exercise in order for weight loss, and to reduce the risk of further co-morbidity.  Exam stable. Labs pending.  Follow up in 1 year for repeat physical.

## 2024-01-11 NOTE — Progress Notes (Signed)
 Subjective:    Patient ID: [name], female    DOB: [birth year], 58 y.o.   MRN: [id number]  HPI  [name] is a very pleasant 58 y.o. female who presents today for complete physical and follow up of chronic conditions.  She would like to discuss her minocycline medication. Chronic management on tetracycline for at least 10 years. More recently on minocycline 100 mg BID for hidradenitis suppurativa to the axilla and under her breasts. When she does not have her minocycline she will notice a return of her hidradenitis under her breasts and vaginal yeast infection. Previously following with dermatology, has tried numerous creams without improvement.   Since April 2024 she's noticed intermittent bilateral lower extremity numbness, weakness, and paralysis. This occurs only when rising from a seated position or when walking. Lasts for 1-2 minutes, occurs multiple times daily. Following with vascular services who completed blood flow studies which were normal. She denies pain, muscle aches, muscle spasms.   Immunizations: -Tetanus: Completed in 2011 -Influenza: Completed last season  -Shingles: Never completed  -Pneumonia: Completed Prevnar 20 in 2024, Prevnar 13 in 2019, pneumovax 23 in 2011  Diet: Fair diet.  Exercise: No regular exercise.  Eye exam: Completes annually  Dental exam: Completes year ago.   Pap Smear: Completed > 5 years  Mammogram: Completed in  Colonoscopy: Completed in 2020, due 2030  BP Readings from Last 3 Encounters:  01/11/24 130/78  01/10/24 (!) 143/71  01/04/24 96/62       Review of Systems  Constitutional:  Negative for unexpected weight change.  HENT:  Negative for rhinorrhea.   Respiratory:  Negative for cough and shortness of breath.   Cardiovascular:  Negative for chest pain.  Gastrointestinal:  Negative for constipation and diarrhea.  Genitourinary:  Negative for difficulty urinating.  Musculoskeletal:  Negative for  arthralgias and myalgias.  Skin:  Negative for rash.  Allergic/Immunologic: Negative for environmental allergies.  Neurological:  Positive for numbness. Negative for dizziness and headaches.       See HPI  Psychiatric/Behavioral:  The patient is not nervous/anxious.          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  Genes Analyzed (43 total): APC*, ATM*, AXIN2, BARD1, BMPR1A, BRCA1*, BRCA2*, BRIP1*, CDH1*, CDK4, CDKN2A, CHEK2*, DICER1, GALNT12, HOXB13, MEN1, MLH1*, MRE11A, MSH2*, MSH3,  MSH6*, MUTYH*, NBN, NF1*, NTHL1, PALB2*, PMS2*, POLD1, POLE, PTEN*, RAD50, RAD51C*, RAD51D*, RET, SDHB, SDHD, SMAD4, SMARCA4, STK11 and TP53* (sequencing and deletion/duplication); EGFR (s   GERD 07/24/2006   GRAVE'S DISEASE 01/01/2008   History of hidradenitis suppurativa    History of kidney stones    History  of thrush    HIV DISEASE 07/24/2006   dx March 05   Hyperlipidemia    HYPERTENSION 07/24/2006   Hyperthyroidism 08/2006   Grave's Disease -diffuse radiotracer uptake 08/25/06 Thyroid scan-Cold nodule to R lower lobe of thyrorid   Ileus (HCC) 02/14/2023   Left bundle branch block (LBBB)    Malnutrition of moderate degree (HCC) 02/08/2023   Menometrorrhagia    hx of   Nephrolithiasis    Panniculitis 05/12/2020   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HCC)    METASTATIC PAPILLARY THYROID CARCINOMA per 01/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 Completed 2 yrs Prednisone after lung bx confirmation. No symptoms since then.   Sebaceous cyst 04/21/2020   Suppurative hidradenitis    Thyroid cancer (HCC)    THYROID NODULE, RIGHT 02/08/2007    Social History   Socioeconomic History   Marital status: Single    Spouse name: Not on file   Number of children: 1   Years of education: Not on file   Highest education level: Not on file  Occupational History   Occupation: Investment banker, corporate  Tobacco Use   Smoking status: Former    Current packs/day: 0.00    Average packs/day: 0.5 packs/day for 15.0 years (7.5 ttl pk-yrs)    Types: Cigarettes    Start date: 04/12/2017    Quit date: 2019    Years since quitting: 6.2   Smokeless tobacco: Never  Vaping Use   Vaping status: Never Used  Substance and Sexual Activity   Alcohol use: Yes    Comment: occasional wine   Drug use: No   Sexual activity: Not  Currently    Birth control/protection: Post-menopausal    Comment: declined condoms  Other Topics Concern   Not on file  Social History Narrative   Right Handed    Lives in a two story home   Social Drivers of Health   Financial Resource Strain: High Risk (01/08/2024)   Overall Financial Resource Strain (CARDIA)    Difficulty of Paying Living Expenses: Hard  Food Insecurity: Food Insecurity Present (01/08/2024)   Hunger Vital Sign    Worried About Running Out of Food in the Last Year: Sometimes true    Ran Out of Food in the Last Year: Sometimes true  Transportation Needs: No Transportation Needs (01/02/2024)   PRAPARE - Administrator, Civil Service (Medical): No    Lack of Transportation (Non-Medical): No  Physical Activity: Not on file  Stress: Not on file  Social Connections: Not on file  Intimate Partner Violence: Not At Risk (01/02/2024)   Humiliation, Afraid, Rape, and Kick questionnaire    Fear of Current or Ex-Partner: No  Emotionally Abused: No    Physically Abused: No    Sexually Abused: No    Past Surgical History:  Procedure Laterality Date   AORTIC ARCH DEBRANCHING N/A 08/25/2023   Procedure: AORTIC ARCH DEBRANCHING USING 12X8X8MM HEMASHIELD GRAFT;  Surgeon: [name], MD;  Location: [facility];  Service: Open Heart Surgery;  Laterality: N/A;  with bypasses to the innominate and left common carotid arteries   APPLICATION OF WOUND VAC N/A 01/20/2021   Procedure: APPLICATION OF WOUND VAC;  Surgeon: Peggye Form, DO;  Location: [facility];  Service: Plastics;  Laterality: N/A;   BREAST EXCISIONAL BIOPSY Right 04/26/2018   right axilla negative   BREAST EXCISIONAL BIOPSY Left 04/26/2018   left axilla negative   BREAST LUMPECTOMY Right 10/03/2018   malignant   BREAST LUMPECTOMY WITH RADIOACTIVE SEED AND SENTINEL LYMPH NODE BIOPSY Right 10/03/2018   Procedure: RIGHT BREAST LUMPECTOMY WITH RADIOACTIVE SEED AND SENTINEL LYMPH NODE  MAPPING;  Surgeon: Harriette Bouillon, MD;  Location: [facility];  Service: General;  Laterality: Right;   BREAST SURGERY  1997   Breast Reduction    CAPD INSERTION N/A 12/21/2023   Procedure: LAPAROSCOPIC INSERTION CONTINUOUS AMBULATORY PERITONEAL DIALYSIS  (CAPD) CATHETER; LAPAROSCOPIC OMENTUMPEXY;  Surgeon: [name], MD;  Location: [facility];  Service: Vascular;  Laterality: N/A;   CYSTOSCOPY W/ URETERAL STENT REMOVAL  11/09/2012   Procedure: CYSTOSCOPY WITH STENT REMOVAL;  Surgeon: Sebastian Ache, MD;  Location: [facility];  Service: Urology;  Laterality: Right;   CYSTOSCOPY WITH RETROGRADE PYELOGRAM, URETEROSCOPY AND STENT PLACEMENT  11/09/2012   Procedure: CYSTOSCOPY WITH RETROGRADE PYELOGRAM, URETEROSCOPY AND STENT PLACEMENT;  Surgeon: Sebastian Ache, MD;  Location: [facility];  Service: Urology;  Laterality: Left;  LEFT URETEROSCOPY, STONE MANIPULATION, left STENT exchange    CYSTOSCOPY WITH STENT PLACEMENT  10/02/2012   Procedure: CYSTOSCOPY WITH STENT PLACEMENT;  Surgeon: Sebastian Ache, MD;  Location: [facility];  Service: Urology;  Laterality: Left;   DEBRIDEMENT AND CLOSURE WOUND N/A 01/20/2021   Procedure: Excision of abdominal wound with closure;  Surgeon: Peggye Form, DO;  Location: [facility];  Service: Plastics;  Laterality: N/A;   DILATION AND CURETTAGE OF UTERUS  11/2002   s/p for 1st trimester nonviable pregnancy   EYE SURGERY     sty under eyelid   INCISE AND DRAIN ABCESS  08/2002   s/p I &D for righ inframmary fold hidradenitis   INCISION AND DRAINAGE PERITONSILLAR ABSCESS  12/2001   IR CV LINE INJECTION  06/07/2018   IR FLUORO GUIDE CV LINE RIGHT  01/30/2023   IR IMAGING GUIDED PORT INSERTION  06/20/2018   IR REMOVAL TUN ACCESS W/ PORT W/O FL MOD SED  06/20/2018   IR US GUIDE VASC ACCESS RIGHT  01/30/2023   IRRIGATION AND DEBRIDEMENT ABSCESS  01/31/2012   Procedure: IRRIGATION AND DEBRIDEMENT ABSCESS;  Surgeon: Kandis Cocking, MD;  Location: [facility];  Service:  General;  Laterality: Right;  right breast and axilla    NEPHROLITHOTOMY  10/02/2012   Procedure: NEPHROLITHOTOMY PERCUTANEOUS;  Surgeon: Sebastian Ache, MD;  Location: [facility];  Service: Urology;  Laterality: Right;  First Stage Percutaneous Nephrolithotomy with Surgeon Access, Left Ureteral Stent     NEPHROLITHOTOMY  10/04/2012   Procedure: NEPHROLITHOTOMY PERCUTANEOUS SECOND LOOK;  Surgeon: Sebastian Ache, MD;  [facility]: [facility];  Service: Urology;  Laterality: Right;      NEPHROLITHOTOMY  10/08/2012   Procedure: NEPHROLITHOTOMY PERCUTANEOUS;  Surgeon: Sebastian Ache, MD;  Location:  [facility];  Service: Urology;  Laterality: Right;  THIRD STAGE, nephrostomy tube exchange x 2   NEPHROLITHOTOMY  10/11/2012   Procedure: NEPHROLITHOTOMY PERCUTANEOUS SECOND LOOK;  Surgeon: Sebastian Ache, MD;  Location: [facility];  Service: Urology;  Laterality: Right;  RIGHT 4 STAGE PERCUTANOUS NEPHROLITHOTOMY, right URETEROSCOPY WITH HOLMIUM LASER    PANNICULECTOMY N/A 12/21/2020   Procedure: PANNICULECTOMY;  Surgeon: Peggye Form, DO;  [facility]: [facility];  Service: Plastics;  Laterality: N/A;   PERCUTANEOUS NEPHROSTOLITHOTOMY  04/2022   PORT-A-CATH REMOVAL N/A 07/16/2020   Procedure: REMOVAL PORT-A-CATH;  Surgeon: Harriette Bouillon, MD;  Location: [facility];  Service: General;  Laterality: N/A;   PORTACATH PLACEMENT Left 05/17/2018   Procedure: INSERTION PORT-A-CATH;  Surgeon: Abigail Miyamoto, MD;  Location: [facility];  Service: General;  Laterality: Left;   RADICAL NECK DISSECTION  04/12/2017   limited/notes 04/12/2017   RADICAL NECK DISSECTION N/A 04/12/2017   Procedure: RADICAL NECK DISSECTION;  Surgeon: Christia Reading, MD;  [facility];  Service: ENT;  Laterality: N/A;  limited neck dissection 2 hours total   REDUCTION MAMMAPLASTY Bilateral 1998   RIGHT/LEFT HEART CATH AND CORONARY ANGIOGRAPHY N/A 03/12/2020   Procedure: RIGHT/LEFT HEART CATH AND CORONARY ANGIOGRAPHY;   Surgeon: Dolores Patty, MD;  [facility];  Service: Cardiovascular;  Laterality: N/A;   [place]  1994   TEE WITHOUT CARDIOVERSION N/A 08/25/2023   Procedure: TRANSESOPHAGEAL ECHOCARDIOGRAM;  Surgeon: [name], MD;  Location: [facility];  Service: Open Heart Surgery;  Laterality: N/A;   THORACIC AORTIC ENDOVASCULAR STENT GRAFT N/A 02/02/2023   Procedure: THORACIC AORTIC ENDOVASCULAR STENT GRAFT;  Surgeon: Victorino Sparrow, MD;  Location: [facility];  Service: Vascular;  Laterality: N/A;   THORACIC AORTIC ENDOVASCULAR STENT GRAFT N/A 08/25/2023   Procedure: THORACIC AORTIC ENDOVASCULAR STENT GRAFT;  Surgeon: Victorino Sparrow, MD;  Location: [facility] OR;  Service: Vascular;  Laterality: N/A;   THYROIDECTOMY  04/12/2017   completion/notes 04/12/2017   THYROIDECTOMY N/A 04/12/2017   Procedure: THYROIDECTOMY;  Surgeon: Christia Reading, MD;  Location: [facility] OR;  Service: ENT;  Laterality: N/A;  Completion Thyroidectomy   TOTAL THYROIDECTOMY  2010   ULTRASOUND GUIDANCE FOR VASCULAR ACCESS Right 08/25/2023   Procedure: ULTRASOUND GUIDANCE FOR VASCULAR ACCESS, RIGHT FEMORAL ARTERY;  Surgeon: Victorino Sparrow, MD;  Location: [facility] OR;  Service: Vascular;  Laterality: Right;    Family History  Problem Relation Age of Onset   Hypertension Mother    Cancer Mother        laryngeal   Heart disease Mother        stent   Pancreatic cancer Father    Hypertension Father    Lung cancer Father 18       hx smoking   Hypertension Sister    Hypertension Sister    Hypertension Brother    Breast cancer Maternal Aunt 8   Breast cancer Maternal Aunt        dx 60+   Cancer Maternal Uncle        Lung CA   Breast cancer Paternal Aunt 79   Breast cancer Paternal Aunt        dx 48's   Breast cancer Paternal Aunt        dx 50's   Prostate cancer Paternal Uncle    Prostate cancer Paternal Uncle    Lung cancer Paternal Uncle    Breast cancer Cousin 34   Breast cancer Cousin  dx <50   Breast cancer  Cousin        dx <50   Breast cancer Cousin        dx <50   Heart disease Other    Hypertension Other    Stroke Other        Grandparent   Kidney disease Other        Grandparent   Diabetes Other        FH of Diabetes   Colon cancer Neg Hx    Esophageal cancer Neg Hx    Rectal cancer Neg Hx    Stomach cancer Neg Hx     Allergies  Allergen Reactions   Genvoya [Elviteg-Cobic-Emtricit-Tenofaf] Hives   Lisinopril Cough   Aldactone [Spironolactone] Hives   Tegaderm Ag Mesh [Silver] Itching    Current Outpatient Medications on File Prior to Visit  Medication Sig Dispense Refill   acetaminophen (TYLENOL) 500 MG tablet Take 1,000 mg by mouth every 6 (six) hours as needed for mild pain.     albuterol (VENTOLIN HFA) 108 (90 Base) MCG/ACT inhaler INHALE 2 PUFFS INTO THE LUNGS UP TO EVERY 6HRS AS NEEDED FOR WHEEZING/SHORTNESS OF BREATH 8 each 0   allopurinol (ZYLOPRIM) 100 MG tablet Take 1 tablet (100 mg total) by mouth daily. For gout prevention 90 tablet 0   amLODipine (NORVASC) 10 MG tablet Take 10 mg by mouth daily.     aspirin EC 81 MG tablet Take 81 mg by mouth daily.     AURYXIA 1 GM 210 MG(Fe) tablet Take 210 mg by mouth 3 (three) times daily with meals.     Blood Glucose Monitoring Suppl (FREESTYLE LITE) w/Device KIT Inject 1 each into the skin daily. 1 kit 0   carvedilol (COREG) 12.5 MG tablet Take 12.5 mg by mouth 2 (two) times daily.     dolutegravir ([name]) 50 MG tablet Take 1 tablet (50 mg total) by mouth daily. 30 tablet 11   emtricitabine-tenofovir AF (DESCOVY) 200-25 MG tablet Take 1 tablet by mouth daily. 30 tablet 11   fluticasone (FLOVENT HFA) 110 MCG/ACT inhaler Inhale 1 puff into the lungs 2 (two) times daily.     Lancets (ONETOUCH ULTRASOFT) lancets Use as instructed to test blood sugar daily 100 each 5   levocetirizine (XYZAL) 5 MG tablet Take 1 tablet (5 mg total) by mouth every evening. For allergies 90 tablet 3   levothyroxine (SYNTHROID) 175 MCG tablet TAKE  1 TABLET BY MOUTH EVERY DAY 90 tablet 3   losartan (COZAAR) 50 MG tablet Take 50 mg by mouth daily.     minocycline (MINOCIN) 100 MG capsule Take 1 capsule (100 mg total) by mouth 2 (two) times daily. 60 capsule 0   rosuvastatin (CRESTOR) 10 MG tablet TAKE 1 TABLET BY MOUTH EVERY DAY FOR CHOLESTEROL 90 tablet 0   traMADol (ULTRAM) 50 MG tablet Take 1 tablet (50 mg total) by mouth every 12 (twelve) hours as needed. 10 tablet 0   No current facility-administered medications on file prior to visit.    BP 130/78   Pulse 76   Temp 98.1 F (36.7 C) (Temporal)   Ht 5\' 5"  (1.651 m)   Wt 183 lb (83 kg)   LMP 03/31/2014 (LMP Unknown)   SpO2 99%   BMI 30.45 kg/m  Objective:   Physical Exam Exam conducted with a chaperone present.  HENT:     Right Ear: Tympanic membrane and ear canal normal.     Left Ear: Tympanic membrane  and ear canal normal.  Eyes:     Pupils: Pupils are equal, round, and reactive to light.  Cardiovascular:     Rate and Rhythm: Normal rate and regular rhythm.  Pulmonary:     Effort: Pulmonary effort is normal.     Breath sounds: Normal breath sounds.  Abdominal:     General: Bowel sounds are normal.     Palpations: Abdomen is soft.     Tenderness: There is no abdominal tenderness.  Genitourinary:    Labia:        Right: No rash, tenderness or lesion.        Left: No rash, tenderness or lesion.      Vagina: Normal.     Cervix: Normal.     Uterus: Normal.      Adnexa: Right adnexa normal and left adnexa normal.  Musculoskeletal:        General: Normal range of motion.     Cervical back: Neck supple.  Skin:    General: Skin is warm and dry.  Neurological:     Mental Status: She is alert and oriented to person, place, and time.     Cranial Nerves: No cranial nerve deficit.     Deep Tendon Reflexes:     Reflex Scores:      Patellar reflexes are 2+ on the right side and 2+ on the left side. Psychiatric:        Mood and Affect: Mood normal.            Assessment & Plan:  Chronic gout of multiple sites, unspecified cause Assessment & Plan: Controlled. Repeat uric acid level pending. Continue allopurinol 100 mg daily.  Orders: -     Uric acid  Type 2 diabetes mellitus with hyperlipidemia (HCC) Assessment & Plan: Controlled with A1c of 5.8 today.  Continue saxagliptin 2.5 mg daily. Foot exam today.  Follow-up in 6 months.  Orders: -     Lipid panel -     sAXagliptin HCl; Take 1 tablet (2.5 mg total) by mouth daily. for diabetes.  Dispense: 90 tablet; Refill: 1 -     POCT glycosylated hemoglobin (Hb A1C)  Hypothyroidism, unspecified type Assessment & Plan: Repeat TSH pending.  Continue levothyroxine 175 mcg daily.  Orders: -     TSH  Screening for cervical cancer -     Cytology - PAP  Hypertension, unspecified type Assessment & Plan: Controlled.  Continue amlodipine 10 mg daily, carvedilol 12.5 mg twice daily, losartan 50 mg daily.    Mild persistent asthma without complication Assessment & Plan: Controlled.  Continue Flovent 110 mcg, 1 puff twice daily and albuterol inhaler as needed. She never received Arnuity Ellipta inhaler.  Will discontinue from medication list.   Laryngopharyngeal reflux (LPR) Assessment & Plan: No concerns today. Remain off treatment.   Hidradenitis suppurativa Assessment & Plan: Unclear if long term tetracycline treatment is best practice, however it has worked effectively for about 10 years. Will consult with infectious disease regarding once daily use of minocycline.   Continue minocycline 100 mg once daily for now.   CKD (chronic kidney disease) stage V requiring chronic dialysis [facility]) Assessment & Plan: Follow with vascular services and nephrology. Peritoneal dialysis catheter in place, plan is to switch to peritoneal dialysis in the future.  Review nephrology notes from March 2025 through [facility] Everywhere. Reviewed vascular surgery notes from yesterday   Chronic  bilateral low back pain without sciatica Assessment & Plan: Improved.  Continue to monitor.  Mild episode of recurrent major depressive disorder (HCC) Assessment & Plan: Stable.  No concerns today. Continue to monitor.   GAD (generalized anxiety disorder) Assessment & Plan: Stable.  Continue to monitor.   HIV infection, unspecified symptom status (HCC) Assessment & Plan: Follow with infectious disease. Office notes reviewed from August 2024.  Continue to Descovy 200-25 mg daily,[name] 50mg  daily.   Hyperlipidemia, unspecified hyperlipidemia type Assessment & Plan: Repeat lipid panel pending.  Hold Crestor 10 mg x 2 weeks given lower extremity symptoms. She will update.   Malignant neoplasm of lower-inner quadrant of right breast of female, estrogen receptor positive (HCC) Assessment & Plan: Following with oncology. Mammogram is overdue, she will call to schedule.  Orders are in place.   Preventative health care Assessment & Plan: Discussed Shingrix vaccines, she will obtain these from the pharmacy. Pap smear due, completed today Mammogram due, she will call to schedule Colonoscopy UTD, due 2030  Discussed the importance of a healthy diet and regular exercise in order for weight loss, and to reduce the risk of further co-morbidity.  Exam stable. Labs pending.  Follow up in 1 year for repeat physical.          Doreene Nest, NP

## 2024-01-11 NOTE — Assessment & Plan Note (Signed)
 Controlled with A1c of 5.8 today.  Continue saxagliptin 2.5 mg daily. Foot exam today.  Follow-up in 6 months.

## 2024-01-11 NOTE — Assessment & Plan Note (Signed)
 Stable.       - Continue to monitor

## 2024-01-11 NOTE — Assessment & Plan Note (Signed)
 Follow with infectious disease. Office notes reviewed from August 2024.  Continue to Descovy 200-25 mg daily,[name] 50mg  daily.

## 2024-01-11 NOTE — Assessment & Plan Note (Signed)
 Repeat TSH pending.  Continue levothyroxine 175 mcg daily.

## 2024-01-11 NOTE — Assessment & Plan Note (Signed)
 Following with oncology. Mammogram is overdue, she will call to schedule.  Orders are in place.

## 2024-01-11 NOTE — Assessment & Plan Note (Signed)
 Repeat lipid panel pending.  Hold Crestor 10 mg x 2 weeks given lower extremity symptoms. She will update.

## 2024-01-11 NOTE — Assessment & Plan Note (Addendum)
 Controlled.  Continue Flovent 110 mcg, 1 puff twice daily and albuterol inhaler as needed. She never received Arnuity Ellipta inhaler.  Will discontinue from medication list.

## 2024-01-11 NOTE — Assessment & Plan Note (Addendum)
 Controlled. Repeat uric acid level pending. Continue allopurinol 100 mg daily.

## 2024-01-12 ENCOUNTER — Encounter (HOSPITAL_COMMUNITY): Payer: Self-pay | Admitting: Vascular Surgery

## 2024-01-12 ENCOUNTER — Telehealth: Payer: Self-pay | Admitting: Primary Care

## 2024-01-12 DIAGNOSIS — N2581 Secondary hyperparathyroidism of renal origin: Secondary | ICD-10-CM | POA: Diagnosis not present

## 2024-01-12 DIAGNOSIS — Z992 Dependence on renal dialysis: Secondary | ICD-10-CM | POA: Diagnosis not present

## 2024-01-12 DIAGNOSIS — N186 End stage renal disease: Secondary | ICD-10-CM | POA: Diagnosis not present

## 2024-01-12 LAB — LIPID PANEL
Cholesterol: 113 mg/dL (ref 0–200)
HDL: 44 mg/dL (ref >39.00)
LDL Cholesterol: 59 mg/dL (ref 0–99)
NonHDL: 69.44
Total CHOL/HDL Ratio: 3
Triglycerides: 53 mg/dL (ref 0.0–149.0)
VLDL: 10.6 mg/dL (ref 0.0–40.0)

## 2024-01-12 LAB — URIC ACID: Uric Acid, Serum: 5.1 mg/dL (ref 2.4–7.0)

## 2024-01-12 LAB — CYTOLOGY - PAP
Comment: NEGATIVE
Diagnosis: NEGATIVE
High risk HPV: NEGATIVE

## 2024-01-12 LAB — TSH: TSH: 0.16 u[IU]/mL — ABNORMAL LOW (ref 0.35–5.50)

## 2024-01-12 NOTE — Telephone Encounter (Signed)
 Please call patient and let her know that we received a fax from [employer] for her disability claim.  Since she is on permanent disability, do we need to complete this?  If so, please go over the activities section and complete.  Please place this back in my inbox and I will complete upon my return.

## 2024-01-15 DIAGNOSIS — Z992 Dependence on renal dialysis: Secondary | ICD-10-CM | POA: Diagnosis not present

## 2024-01-15 DIAGNOSIS — N2581 Secondary hyperparathyroidism of renal origin: Secondary | ICD-10-CM | POA: Diagnosis not present

## 2024-01-15 DIAGNOSIS — N186 End stage renal disease: Secondary | ICD-10-CM | POA: Diagnosis not present

## 2024-01-15 DIAGNOSIS — E1122 Type 2 diabetes mellitus with diabetic chronic kidney disease: Secondary | ICD-10-CM | POA: Diagnosis not present

## 2024-01-15 NOTE — Telephone Encounter (Signed)
 Unable to reach patient. Left voicemail to return call to our office.

## 2024-01-16 ENCOUNTER — Telehealth: Payer: Self-pay

## 2024-01-16 MED ORDER — LEVOTHYROXINE SODIUM 150 MCG PO TABS: 150.0000 ug | ORAL_TABLET | Freq: Every day | ORAL | 2 refills | Status: DC

## 2024-01-16 NOTE — Telephone Encounter (Signed)
 Scheduled patient with Mort Sawyers 04/03 per patient request.

## 2024-01-16 NOTE — Telephone Encounter (Signed)
 Pt has been notified and voices understanding. Rx has been sent. Please get pt scheduled.

## 2024-01-16 NOTE — Telephone Encounter (Signed)
 Called and spoke with patient, she does need this ppw completed. Went through Activity section with patient, ppw placed back in [facility] inbox for review and completion.   Patient states she started the minocycline Sunday and yesterday she noticed she has another yeast infection. She started having vaginal itching yesterday. Patient is requesting medication be sent in since she was just seen.

## 2024-01-16 NOTE — Telephone Encounter (Signed)
-----   Message from Carlus Pavlov sent at 01/16/2024  2:04 PM EDT ----- Leavy Cella, This patient's TSH was too low.  Let's reduce the dose of levothyroxine to 150 mcg daily.  Lets schedule another appointment approximately in 1-1/2 months and we can recheck her test at that time.  She is overdue for her annual appointment. Thank you! C

## 2024-01-16 NOTE — Telephone Encounter (Signed)
 Please call patient:  I know that she was just in, but we really need to collect a vaginal swab to confirm the presence of yeast. I'm not trying to make things difficult for her, but we don't have a test on file that confirms a prior yeast infection. Can we get her in with someone to have her swabbed? I will be back on Friday.

## 2024-01-17 ENCOUNTER — Ambulatory Visit: Admitting: General Practice

## 2024-01-17 DIAGNOSIS — N186 End stage renal disease: Secondary | ICD-10-CM | POA: Diagnosis not present

## 2024-01-17 DIAGNOSIS — Z992 Dependence on renal dialysis: Secondary | ICD-10-CM | POA: Diagnosis not present

## 2024-01-17 DIAGNOSIS — N2581 Secondary hyperparathyroidism of renal origin: Secondary | ICD-10-CM | POA: Diagnosis not present

## 2024-01-18 ENCOUNTER — Ambulatory Visit (INDEPENDENT_AMBULATORY_CARE_PROVIDER_SITE_OTHER): Admitting: Family

## 2024-01-18 ENCOUNTER — Ambulatory Visit: Payer: Self-pay

## 2024-01-18 ENCOUNTER — Encounter: Payer: Self-pay | Admitting: Family

## 2024-01-18 VITALS — BP 132/76 | HR 85 | Temp 98.3°F | Ht 65.0 in | Wt 180.8 lb

## 2024-01-18 DIAGNOSIS — R300 Dysuria: Secondary | ICD-10-CM | POA: Diagnosis not present

## 2024-01-18 DIAGNOSIS — N898 Other specified noninflammatory disorders of vagina: Secondary | ICD-10-CM | POA: Insufficient documentation

## 2024-01-18 NOTE — Assessment & Plan Note (Addendum)
 Wet prep today pending results.  Will try to get urine sample however pt can not void today, less likely however will r/o.  Will await results for treatment as CKD stage 4  defer pelvic exam, pap x 1 week ago

## 2024-01-18 NOTE — Patient Outreach (Unsigned)
 Care Coordination   Follow Up Visit Note   01/18/2024 Name: [name] MRN: [id number] DOB: 24-Jan-1966  [name] is a 58 y.o. year old female who sees Doreene Nest, NP for primary care. I spoke with  [name] by phone today.  What matters to the patients health and wellness today?  Patient states she is doing fine. She states her dialysis catheter is working fine now. Per chart review patient had  laparoscopy for PD cath repositioning on 01/09/25.  Patient states she is doing ok from the procedure just a little sore. Patient reports having primary care provider visit on 01/11/24 for her physical. Patient states her doctor discontinued her cholesterol medication in light of the LE numbness/ weakness symptoms she's been having. She states she is noticing some difference in her symptoms since she's been off of the medications.  She states this seem to be getting a little better.  Patient states she has followed up with her provider due to possible bacterial vaginitis.    Goals Addressed             This Visit's Progress    Management and education of health conditions/Community resource assistance       Interventions Today    Flowsheet Row Most Recent Value  Chronic Disease   Chronic disease during today's visit Chronic Kidney Disease/End Stage Renal Disease (ESRD), Other  [chronic back pain/mobility concerns, hidradenitis]  General Interventions   General Interventions Discussed/Reviewed General Interventions Reviewed, Doctor Visits  [evaluation of current treatment plan for listed health conditions and patients adherence to plan as established by provider. assessed for new or ongoing symptoms.]  Doctor Visits Discussed/Reviewed Doctor Visits Reviewed  Annabell Sabal upcoming provider visits.]  Pharmacy Interventions   Pharmacy Dicussed/Reviewed Pharmacy Topics Reviewed              SDOH assessments and interventions completed:  No     Care  Coordination Interventions:  Yes, provided   Follow up plan: Follow up call scheduled for 02/09/24 at 2:30 pm    Encounter Outcome:  Patient Visit Completed   George Ina RN, BSN, CCM [facility], Population Health Case Manager Phone: [phone]

## 2024-01-18 NOTE — Patient Instructions (Signed)
 Visit Information  Thank you for taking time to visit with me today. Please don't hesitate to contact me if I can be of assistance to you.   Following are the goals we discussed today:   Goals Addressed             This Visit's Progress    Management and education of health conditions/Community resource assistance       Interventions Today    Flowsheet Row Most Recent Value  Chronic Disease   Chronic disease during today's visit Chronic Kidney Disease/End Stage Renal Disease (ESRD), Other  [chronic back pain/mobility concerns, hidradenitis]  General Interventions   General Interventions Discussed/Reviewed General Interventions Reviewed, Doctor Visits  [evaluation of current treatment plan for listed health conditions and patients adherence to plan as established by provider. assessed for new or ongoing symptoms.]  Doctor Visits Discussed/Reviewed Doctor Visits Reviewed  Annabell Sabal upcoming provider visits.]  Pharmacy Interventions   Pharmacy Dicussed/Reviewed Pharmacy Topics Reviewed              Our next appointment is by telephone on 02/09/24 at 2:30 pm  Please call the care guide team at [phone] if you need to cancel or reschedule your appointment.   If you are experiencing a Mental Health or Behavioral Health Crisis or need someone to talk to, please call the Suicide and Crisis Lifeline: 988 call 1-800-273-TALK (toll free, 24 hour hotline)  Patient verbalizes understanding of instructions and care plan provided today and agrees to view in MyChart. Active MyChart status and patient understanding of how to access instructions and care plan via MyChart confirmed with patient.     George Ina RN, BSN, CCM [employer], Population Health Case Manager Phone: [phone]

## 2024-01-18 NOTE — Progress Notes (Unsigned)
 Established Patient Office Visit  Subjective:   Patient ID: [name], female    DOB: 05/27/1966  Age: 58 y.o. MRN: [id number]  CC:  Chief Complaint  Patient presents with   Vaginal Itching    HPI: [name] is a 58 y.o. female presenting on 01/18/2024 for Vaginal Itching  Sunday noticed while she was peeing it felt like dysuria and then scratchy which progressed throughout the night. No vaginal discharge that she has seen. She is on dialysis so no good answer to urinary frequency or urgency. Pap negative 01/11/24.    Did just restart minocycline a few days before her symptoms started.       ROS: Negative unless specifically indicated above in HPI.   Relevant past medical history reviewed and updated as indicated.   Allergies and medications reviewed and updated.   Current Outpatient Medications:    acetaminophen (TYLENOL) 500 MG tablet, Take 1,000 mg by mouth every 6 (six) hours as needed for mild pain., Disp: , Rfl:    albuterol (VENTOLIN HFA) 108 (90 Base) MCG/ACT inhaler, INHALE 2 PUFFS INTO THE LUNGS UP TO EVERY 6HRS AS NEEDED FOR WHEEZING/SHORTNESS OF BREATH, Disp: 8 each, Rfl: 0   allopurinol (ZYLOPRIM) 100 MG tablet, Take 1 tablet (100 mg total) by mouth daily. For gout prevention, Disp: 90 tablet, Rfl: 0   amLODipine (NORVASC) 10 MG tablet, Take 10 mg by mouth daily., Disp: , Rfl:    aspirin EC 81 MG tablet, Take 81 mg by mouth daily., Disp: , Rfl:    AURYXIA 1 GM 210 MG(Fe) tablet, Take 210 mg by mouth 3 (three) times daily with meals., Disp: , Rfl:    Blood Glucose Monitoring Suppl (FREESTYLE LITE) w/Device KIT, Inject 1 each into the skin daily., Disp: 1 kit, Rfl: 0   carvedilol (COREG) 12.5 MG tablet, Take 12.5 mg by mouth 2 (two) times daily., Disp: , Rfl:    dolutegravir ([name]) 50 MG tablet, Take 1 tablet (50 mg total) by mouth daily., Disp: 30 tablet, Rfl: 11   emtricitabine-tenofovir AF (DESCOVY) 200-25 MG tablet, Take 1 tablet by  mouth daily., Disp: 30 tablet, Rfl: 11   fluticasone (FLOVENT HFA) 110 MCG/ACT inhaler, Inhale 1 puff into the lungs 2 (two) times daily., Disp: , Rfl:    Lancets (ONETOUCH ULTRASOFT) lancets, Use as instructed to test blood sugar daily, Disp: 100 each, Rfl: 5   levocetirizine (XYZAL) 5 MG tablet, Take 1 tablet (5 mg total) by mouth every evening. For allergies, Disp: 90 tablet, Rfl: 3   levothyroxine (SYNTHROID) 150 MCG tablet, Take 1 tablet (150 mcg total) by mouth daily before breakfast., Disp: 30 tablet, Rfl: 2   losartan (COZAAR) 50 MG tablet, Take 50 mg by mouth daily., Disp: , Rfl:    rosuvastatin (CRESTOR) 10 MG tablet, TAKE 1 TABLET BY MOUTH EVERY DAY FOR CHOLESTEROL, Disp: 90 tablet, Rfl: 0   saxagliptin HCl (ONGLYZA) 2.5 MG TABS tablet, Take 1 tablet (2.5 mg total) by mouth daily. for diabetes., Disp: 90 tablet, Rfl: 1   traMADol (ULTRAM) 50 MG tablet, Take 1 tablet (50 mg total) by mouth every 12 (twelve) hours as needed., Disp: 10 tablet, Rfl: 0  Allergies  Allergen Reactions   Genvoya [Elviteg-Cobic-Emtricit-Tenofaf] Hives   Lisinopril Cough   Aldactone [Spironolactone] Hives   Tegaderm Ag Mesh [Silver] Itching    Objective:   BP 132/76 (BP Location: Left Arm, Patient Position: Sitting, Cuff Size: Normal)   Pulse 85   Temp  98.3 F (36.8 C) (Temporal)   Ht 5\' 5"  (1.651 m)   Wt 180 lb 12.8 oz (82 kg)   LMP 03/31/2014 (LMP Unknown)   SpO2 98%   BMI 30.09 kg/m    Physical Exam Constitutional:      General: She is not in acute distress.    Appearance: Normal appearance. She is normal weight. She is not ill-appearing, toxic-appearing or diaphoretic.  HENT:     Head: Normocephalic.  Cardiovascular:     Rate and Rhythm: Normal rate.  Pulmonary:     Effort: Pulmonary effort is normal.  Musculoskeletal:        General: Normal range of motion.  Neurological:     General: No focal deficit present.     Mental Status: She is alert and oriented to person, place, and time.  Mental status is at baseline.  Psychiatric:        Mood and Affect: Mood normal.        Behavior: Behavior normal.        Thought Content: Thought content normal.        Judgment: Judgment normal.     Assessment & Plan:  Vaginal itching Assessment & Plan: Wet prep today pending results.  Will try to get urine sample however pt can not void today, less likely however will r/o.  Will await results for treatment as CKD stage 4  defer pelvic exam, pap x 1 week ago  Orders: -     WET PREP BY MOLECULAR PROBE  Dysuria -     WET PREP BY MOLECULAR PROBE -     Urinalysis w microscopic + reflex cultur; Future     Follow up plan: Return for f/u PCP if no improvement in symptoms.  Mort Sawyers, FNP

## 2024-01-19 ENCOUNTER — Encounter: Payer: Self-pay | Admitting: Family

## 2024-01-19 ENCOUNTER — Other Ambulatory Visit: Payer: Self-pay | Admitting: Family

## 2024-01-19 DIAGNOSIS — N186 End stage renal disease: Secondary | ICD-10-CM | POA: Diagnosis not present

## 2024-01-19 DIAGNOSIS — Z992 Dependence on renal dialysis: Secondary | ICD-10-CM | POA: Diagnosis not present

## 2024-01-19 DIAGNOSIS — B9689 Other specified bacterial agents as the cause of diseases classified elsewhere: Secondary | ICD-10-CM

## 2024-01-19 DIAGNOSIS — N2581 Secondary hyperparathyroidism of renal origin: Secondary | ICD-10-CM | POA: Diagnosis not present

## 2024-01-19 LAB — WET PREP BY MOLECULAR PROBE
Candida species: NOT DETECTED
MICRO NUMBER:: 16285295
SPECIMEN QUALITY:: ADEQUATE
Trichomonas vaginosis: NOT DETECTED

## 2024-01-19 MED ORDER — METRONIDAZOLE 0.75 % EX GEL
CUTANEOUS | 0 refills | Status: DC
Start: 2024-01-19 — End: 2024-03-13

## 2024-01-22 DIAGNOSIS — Z992 Dependence on renal dialysis: Secondary | ICD-10-CM | POA: Diagnosis not present

## 2024-01-22 DIAGNOSIS — N2581 Secondary hyperparathyroidism of renal origin: Secondary | ICD-10-CM | POA: Diagnosis not present

## 2024-01-22 DIAGNOSIS — N186 End stage renal disease: Secondary | ICD-10-CM | POA: Diagnosis not present

## 2024-01-23 DIAGNOSIS — Z992 Dependence on renal dialysis: Secondary | ICD-10-CM | POA: Diagnosis not present

## 2024-01-23 DIAGNOSIS — N186 End stage renal disease: Secondary | ICD-10-CM | POA: Diagnosis not present

## 2024-01-23 DIAGNOSIS — N2581 Secondary hyperparathyroidism of renal origin: Secondary | ICD-10-CM | POA: Diagnosis not present

## 2024-01-24 DIAGNOSIS — N2581 Secondary hyperparathyroidism of renal origin: Secondary | ICD-10-CM | POA: Diagnosis not present

## 2024-01-24 DIAGNOSIS — N186 End stage renal disease: Secondary | ICD-10-CM | POA: Diagnosis not present

## 2024-01-24 DIAGNOSIS — Z992 Dependence on renal dialysis: Secondary | ICD-10-CM | POA: Diagnosis not present

## 2024-01-25 DIAGNOSIS — N2581 Secondary hyperparathyroidism of renal origin: Secondary | ICD-10-CM | POA: Diagnosis not present

## 2024-01-25 DIAGNOSIS — Z992 Dependence on renal dialysis: Secondary | ICD-10-CM | POA: Diagnosis not present

## 2024-01-25 DIAGNOSIS — N186 End stage renal disease: Secondary | ICD-10-CM | POA: Diagnosis not present

## 2024-01-26 DIAGNOSIS — Z992 Dependence on renal dialysis: Secondary | ICD-10-CM | POA: Diagnosis not present

## 2024-01-26 DIAGNOSIS — N186 End stage renal disease: Secondary | ICD-10-CM | POA: Diagnosis not present

## 2024-01-26 DIAGNOSIS — N2581 Secondary hyperparathyroidism of renal origin: Secondary | ICD-10-CM | POA: Diagnosis not present

## 2024-01-29 DIAGNOSIS — N186 End stage renal disease: Secondary | ICD-10-CM | POA: Diagnosis not present

## 2024-01-29 DIAGNOSIS — Z992 Dependence on renal dialysis: Secondary | ICD-10-CM | POA: Diagnosis not present

## 2024-01-29 DIAGNOSIS — N2581 Secondary hyperparathyroidism of renal origin: Secondary | ICD-10-CM | POA: Diagnosis not present

## 2024-01-30 ENCOUNTER — Ambulatory Visit: Payer: Self-pay

## 2024-01-30 DIAGNOSIS — N2581 Secondary hyperparathyroidism of renal origin: Secondary | ICD-10-CM | POA: Diagnosis not present

## 2024-01-30 DIAGNOSIS — N186 End stage renal disease: Secondary | ICD-10-CM | POA: Diagnosis not present

## 2024-01-30 DIAGNOSIS — Z992 Dependence on renal dialysis: Secondary | ICD-10-CM | POA: Diagnosis not present

## 2024-01-30 NOTE — Telephone Encounter (Signed)
 Chief Complaint: High blood pressure Symptoms: facial puffiness Frequency: 1 week Pertinent Negatives: Patient denies chest pain, difficulty breathing Disposition: [] ED /[] Urgent Care (no appt availability in office) / [x] Appointment(In office/virtual)/ []  [facility]/ [] Home Care/ [] Refused Recommended Disposition /[] [facility]/ [x]  Follow-up with PCP Additional Notes: Patient called in stating she has been experiencing higher than usual BP for a week. Patient is currently at the dialysis center and getting trained/adjusted to utilizing the "Home Dialysis Machine" that she will be switching to soon. She states that her BP has been running higher while utilizing this machine, and has been running in the 170-180/90s. Patient had dialysis nurse at side while on phone and nurse states patient should be on a diuretic medication. Patient has a recent 8 lb weight gain, but states when patient is at her dry weight she still is experiencing BP around 140/90 lately. Patient advised to be seen by PCP for medication adjustment asap. PCP does not have open availability, and patient is asking if PCP can squeeze her in for a virtual visit. Advised patient this will be sent to office as high priority and patient will be contacted back asap regarding plan of care/appt options.    Copied from CRM [id number]. Topic: Clinical - Red Word Triage >> Jan 30, 2024  9:48 AM Orien Bird wrote: [facility] that prompted transfer to Nurse Triage: patient called stating her blood pressure has been high since last week patient stated it's been over 174/93 that is the range the blood pressure has been in. Reason for Disposition  Systolic BP  >= 180 OR Diastolic >= 110  Answer Assessment - Initial Assessment Questions 1. BLOOD PRESSURE: "What is the blood pressure?" "Did you take at least two measurements 5 minutes apart?"     174/93 2. ONSET: "When did you take your blood pressure?"     This morning 3. HOW:  "How did you take your blood pressure?" (e.g., automatic home BP monitor, visiting nurse)     Automatic cuff 4. HISTORY: "Do you have a history of high blood pressure?"     Yes 5. MEDICINES: "Are you taking any medicines for blood pressure?" "Have you missed any doses recently?"     Amlodopine, Losartan, Carvedilol 6. OTHER SYMPTOMS: "Do you have any symptoms?" (e.g., blurred vision, chest pain, difficulty breathing, headache, weakness)     Headache  Protocols used: Blood Pressure - High-A-AH

## 2024-01-30 NOTE — Telephone Encounter (Signed)
 Yes, can we add her on for a virtual visit for 04/16 at 3:40?

## 2024-01-30 NOTE — Telephone Encounter (Signed)
 Patient added on for 4/16 @ 3:40pm.

## 2024-01-31 ENCOUNTER — Telehealth: Admitting: Primary Care

## 2024-01-31 ENCOUNTER — Encounter: Payer: Self-pay | Admitting: Primary Care

## 2024-01-31 VITALS — BP 133/82

## 2024-01-31 DIAGNOSIS — N186 End stage renal disease: Secondary | ICD-10-CM | POA: Diagnosis not present

## 2024-01-31 DIAGNOSIS — I10 Essential (primary) hypertension: Secondary | ICD-10-CM

## 2024-01-31 DIAGNOSIS — Z992 Dependence on renal dialysis: Secondary | ICD-10-CM | POA: Diagnosis not present

## 2024-01-31 DIAGNOSIS — N2581 Secondary hyperparathyroidism of renal origin: Secondary | ICD-10-CM | POA: Diagnosis not present

## 2024-01-31 NOTE — Patient Instructions (Signed)
 Continue taking all your blood pressure medications as prescribed.  Start monitoring your blood pressure once daily at discussed.  Send you blood pressure readings via MyChart on Monday next week.  It was a pleasure to see you today!

## 2024-01-31 NOTE — Assessment & Plan Note (Signed)
 Controlled today based on most recent home reading. Question if her transient elevated blood pressure readings were secondary to the transition from hemodialysis to peritoneal dialysis.  Continue losartan 50 mg daily, chlorthalidone 25 mg daily, carvedilol 25 mg twice daily. No adjustments made today.  She will send blood pressure readings via MyChart early next week.

## 2024-01-31 NOTE — Progress Notes (Signed)
 Patient ID: [name], female    DOB: 07-Aug-1966, 58 y.o.   MRN: [id number]  Virtual visit completed through [employer], a video enabled telemedicine application. Due to national recommendations of social distancing due to COVID-19, a virtual visit is felt to be most appropriate for this patient at this time. Reviewed limitations, risks, security and privacy concerns of performing a virtual visit and the availability of in person appointments. I also reviewed that there may be a patient responsible charge related to this service. The patient agreed to proceed.   Patient location: home Provider location: [facility], office Persons participating in this virtual visit: patient, provider   If any vitals were documented, they were collected by patient at home unless specified below.    BP 133/82 Comment: Per patient  LMP 03/31/2014 (LMP Unknown)    CC: Hypertension Subjective:   HPI: [name] is a 58 y.o. female with a significant medical history including hypertension, type 2 diabetes, CKD, HIV, thyroid cancer, breast cancer, hyperlipidemia, aortic aneurysm with dissection presenting on 01/31/2024 for Hypertension (BP concerns)  Currently managed on amlodipine 10 mg daily, carvedilol 12.5 mg twice daily, losartan 50 mg daily.  She is compliant to this regimen daily. She is working to transition from hemodialysis to peritoneal dialysis.  While during her training in the dialysis center her blood pressures have been noted to be 170-180/90s prior to dialysis, dry weight BP of 140/90. Yesterday her BP was 174/93. The last several days she BP was running 171/101,167/95. She believes that her BP medication was tweaked during hemodialysis due to hypotension.  Today was her first day of peritoneal dialysis at home.  Her home dialysis nurse just left. Her BP with her own, was 133/82.  She has noticed headaches, dry mouth.  She is due for follow-up in 1 week at the  dialysis center.  BP Readings from Last 3 Encounters:  01/31/24 133/82  01/18/24 132/76  01/11/24 130/78         Relevant past medical, surgical, family and social history reviewed and updated as indicated. Interim medical history since our last visit reviewed. Allergies and medications reviewed and updated. Outpatient Medications Prior to Visit  Medication Sig Dispense Refill   acetaminophen (TYLENOL) 500 MG tablet Take 1,000 mg by mouth every 6 (six) hours as needed for mild pain.     albuterol (VENTOLIN HFA) 108 (90 Base) MCG/ACT inhaler INHALE 2 PUFFS INTO THE LUNGS UP TO EVERY 6HRS AS NEEDED FOR WHEEZING/SHORTNESS OF BREATH 8 each 0   allopurinol (ZYLOPRIM) 100 MG tablet Take 1 tablet (100 mg total) by mouth daily. For gout prevention 90 tablet 0   amLODipine (NORVASC) 10 MG tablet Take 10 mg by mouth daily.     aspirin EC 81 MG tablet Take 81 mg by mouth daily.     AURYXIA 1 GM 210 MG(Fe) tablet Take 210 mg by mouth 3 (three) times daily with meals.     Blood Glucose Monitoring Suppl (FREESTYLE LITE) w/Device KIT Inject 1 each into the skin daily. 1 kit 0   carvedilol (COREG) 12.5 MG tablet Take 12.5 mg by mouth 2 (two) times daily.     dolutegravir ([name]) 50 MG tablet Take 1 tablet (50 mg total) by mouth daily. 30 tablet 11   emtricitabine-tenofovir AF (DESCOVY) 200-25 MG tablet Take 1 tablet by mouth daily. 30 tablet 11   fluticasone (FLOVENT HFA) 110 MCG/ACT inhaler Inhale 1 puff into the lungs 2 (two) times  daily.     Lancets (ONETOUCH ULTRASOFT) lancets Use as instructed to test blood sugar daily 100 each 5   levocetirizine (XYZAL) 5 MG tablet Take 1 tablet (5 mg total) by mouth every evening. For allergies 90 tablet 3   levothyroxine (SYNTHROID) 150 MCG tablet Take 1 tablet (150 mcg total) by mouth daily before breakfast. 30 tablet 2   losartan (COZAAR) 50 MG tablet Take 50 mg by mouth daily.     metroNIDAZOLE (METROGEL) 0.75 % gel Apply 1 applicatorful PV at bedtime x 5  days 45 g 0   saxagliptin HCl (ONGLYZA) 2.5 MG TABS tablet Take 1 tablet (2.5 mg total) by mouth daily. for diabetes. 90 tablet 1   traMADol (ULTRAM) 50 MG tablet Take 1 tablet (50 mg total) by mouth every 12 (twelve) hours as needed. 10 tablet 0   rosuvastatin (CRESTOR) 10 MG tablet TAKE 1 TABLET BY MOUTH EVERY DAY FOR CHOLESTEROL (Patient not taking: Reported on 01/31/2024) 90 tablet 0   No facility-administered medications prior to visit.     Per HPI unless specifically indicated in ROS section below Review of Systems  Respiratory:  Negative for shortness of breath.   Cardiovascular:  Negative for chest pain.  Neurological:  Positive for headaches.   Objective:  BP 133/82 Comment: Per patient  LMP 03/31/2014 (LMP Unknown)   Wt Readings from Last 3 Encounters:  01/18/24 180 lb 12.8 oz (82 kg)  01/11/24 183 lb (83 kg)  01/10/24 172 lb (78 kg)       Physical exam: General: Alert and oriented x 3, no distress, does not appear sickly  Pulmonary: Speaks in complete sentences without increased work of breathing, no cough during visit.  Psychiatric: Normal mood, thought content, and behavior.     Results for orders placed or performed in visit on 01/18/24  WET PREP BY MOLECULAR PROBE   Collection Time: 01/18/24  1:07 PM   Specimen: Vaginal Swab  Result Value Ref Range   MICRO NUMBER: [id number]    SPECIMEN QUALITY: Adequate    SOURCE: VAGINA    STATUS: FINAL    Trichomonas vaginosis Not Detected    Gardnerella vaginalis (A)     Detected. Increased levels of G. vaginalis may not be significant in the absence of signs and symptoms of bacterial vaginosis.   Candida species Not Detected    *Note: Due to a large number of results and/or encounters for the requested time period, some results have not been displayed. A complete set of results can be found in Results Review.   Assessment & Plan:   Problem List Items Addressed This Visit       Cardiovascular and Mediastinum    Hypertension - Primary   Controlled today based on most recent home reading. Question if her transient elevated blood pressure readings were secondary to the transition from hemodialysis to peritoneal dialysis.  Continue losartan 50 mg daily, chlorthalidone 25 mg daily, carvedilol 25 mg twice daily. No adjustments made today.  She will send blood pressure readings via MyChart early next week.        No orders of the defined types were placed in this encounter.  No orders of the defined types were placed in this encounter.   I discussed the assessment and treatment plan with the patient. The patient was provided an opportunity to ask questions and all were answered. The patient agreed with the plan and demonstrated an understanding of the instructions. The patient was advised to call back  or seek an in-person evaluation if the symptoms worsen or if the condition fails to improve as anticipated.  Follow up plan:  Continue taking all your blood pressure medications as prescribed.  Start monitoring your blood pressure once daily at discussed.  Send you blood pressure readings via MyChart on Monday next week.  It was a pleasure to see you today!   Vance Hochmuth K Kyair Ditommaso, NP

## 2024-02-01 DIAGNOSIS — N2581 Secondary hyperparathyroidism of renal origin: Secondary | ICD-10-CM | POA: Diagnosis not present

## 2024-02-01 DIAGNOSIS — Z992 Dependence on renal dialysis: Secondary | ICD-10-CM | POA: Diagnosis not present

## 2024-02-01 DIAGNOSIS — N186 End stage renal disease: Secondary | ICD-10-CM | POA: Diagnosis not present

## 2024-02-02 DIAGNOSIS — Z992 Dependence on renal dialysis: Secondary | ICD-10-CM | POA: Diagnosis not present

## 2024-02-02 DIAGNOSIS — N2581 Secondary hyperparathyroidism of renal origin: Secondary | ICD-10-CM | POA: Diagnosis not present

## 2024-02-02 DIAGNOSIS — N186 End stage renal disease: Secondary | ICD-10-CM | POA: Diagnosis not present

## 2024-02-03 DIAGNOSIS — Z992 Dependence on renal dialysis: Secondary | ICD-10-CM | POA: Diagnosis not present

## 2024-02-03 DIAGNOSIS — N2581 Secondary hyperparathyroidism of renal origin: Secondary | ICD-10-CM | POA: Diagnosis not present

## 2024-02-03 DIAGNOSIS — N186 End stage renal disease: Secondary | ICD-10-CM | POA: Diagnosis not present

## 2024-02-04 DIAGNOSIS — Z992 Dependence on renal dialysis: Secondary | ICD-10-CM | POA: Diagnosis not present

## 2024-02-04 DIAGNOSIS — N186 End stage renal disease: Secondary | ICD-10-CM | POA: Diagnosis not present

## 2024-02-04 DIAGNOSIS — N2581 Secondary hyperparathyroidism of renal origin: Secondary | ICD-10-CM | POA: Diagnosis not present

## 2024-02-05 DIAGNOSIS — N2581 Secondary hyperparathyroidism of renal origin: Secondary | ICD-10-CM | POA: Diagnosis not present

## 2024-02-05 DIAGNOSIS — N186 End stage renal disease: Secondary | ICD-10-CM | POA: Diagnosis not present

## 2024-02-05 DIAGNOSIS — Z992 Dependence on renal dialysis: Secondary | ICD-10-CM | POA: Diagnosis not present

## 2024-02-06 DIAGNOSIS — N2581 Secondary hyperparathyroidism of renal origin: Secondary | ICD-10-CM | POA: Diagnosis not present

## 2024-02-06 DIAGNOSIS — N186 End stage renal disease: Secondary | ICD-10-CM | POA: Diagnosis not present

## 2024-02-06 DIAGNOSIS — Z992 Dependence on renal dialysis: Secondary | ICD-10-CM | POA: Diagnosis not present

## 2024-02-07 DIAGNOSIS — N186 End stage renal disease: Secondary | ICD-10-CM | POA: Diagnosis not present

## 2024-02-07 DIAGNOSIS — N2581 Secondary hyperparathyroidism of renal origin: Secondary | ICD-10-CM | POA: Diagnosis not present

## 2024-02-07 DIAGNOSIS — Z992 Dependence on renal dialysis: Secondary | ICD-10-CM | POA: Diagnosis not present

## 2024-02-08 DIAGNOSIS — N186 End stage renal disease: Secondary | ICD-10-CM | POA: Diagnosis not present

## 2024-02-08 DIAGNOSIS — Z992 Dependence on renal dialysis: Secondary | ICD-10-CM | POA: Diagnosis not present

## 2024-02-08 DIAGNOSIS — N2581 Secondary hyperparathyroidism of renal origin: Secondary | ICD-10-CM | POA: Diagnosis not present

## 2024-02-09 ENCOUNTER — Ambulatory Visit: Payer: Self-pay

## 2024-02-09 ENCOUNTER — Other Ambulatory Visit: Payer: Self-pay

## 2024-02-09 DIAGNOSIS — N2581 Secondary hyperparathyroidism of renal origin: Secondary | ICD-10-CM | POA: Diagnosis not present

## 2024-02-09 DIAGNOSIS — N186 End stage renal disease: Secondary | ICD-10-CM | POA: Diagnosis not present

## 2024-02-09 DIAGNOSIS — Z992 Dependence on renal dialysis: Secondary | ICD-10-CM | POA: Diagnosis not present

## 2024-02-09 NOTE — Patient Outreach (Signed)
 Complex Care Management   Visit Note  02/09/2024  Name:  [name] MRN: [id number] DOB: 1966-05-21  Situation: Referral received for Complex Care Management related to ESRD I obtained verbal consent from Patient.  Visit completed with patient  on the phone Unable to complete full assessment with patient today.  Patient states she is unable to complete call today and request return call at future date.   Background:   Past Medical History:  Diagnosis Date   Acute pain of right shoulder due to trauma 06/08/2023   Acute pancreatitis 10/23/2021   Acute renal failure superimposed on stage 4 chronic kidney disease (HCC) 10/24/2021   Allergy    Anemia    Normocytic   Antibiotic-induced yeast infection 09/28/2020   Anxiety    Asthma    Bronchitis 2005   Bursitis of left shoulder 09/06/2019   CKD (chronic kidney disease)    Dialysis on Mon-Wed- Fri   CLASS 1-EXOPHTHALMOS-THYROTOXIC 02/08/2007   Cognitive dysfunction 02/21/2020   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  Genes Analyzed (43 total): APC*, ATM*, AXIN2, BARD1, BMPR1A, BRCA1*, BRCA2*, BRIP1*, CDH1*, CDK4, CDKN2A, CHEK2*, DICER1, GALNT12, HOXB13, MEN1, MLH1*, MRE11A, MSH2*, MSH3, MSH6*, MUTYH*, NBN, NF1*, NTHL1, PALB2*, PMS2*, POLD1, POLE, PTEN*, RAD50, RAD51C*, RAD51D*, RET, SDHB, SDHD, SMAD4, SMARCA4, STK11 and TP53* (sequencing and  deletion/duplication); EGFR (s   GERD 07/24/2006   GRAVE'S DISEASE 01/01/2008   History of hidradenitis suppurativa    History of kidney stones    History of thrush    HIV DISEASE 07/24/2006   dx March 05   Hyperlipidemia    HYPERTENSION 07/24/2006   Hyperthyroidism 08/2006   Grave's Disease -diffuse radiotracer uptake 08/25/06 Thyroid  scan-Cold nodule to R lower lobe of thyrorid   Ileus (HCC) 02/14/2023   Left bundle branch block (LBBB)    Malnutrition of moderate degree (HCC) 02/08/2023   Menometrorrhagia    hx of   Nephrolithiasis    Panniculitis 05/12/2020   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 Completed 2 yrs Prednisone  after lung bx confirmation. No symptoms since then.   Sebaceous cyst 04/21/2020   Suppurative hidradenitis    Thyroid  cancer (HCC)    THYROID  NODULE, RIGHT 02/08/2007    Assessment: Patient Reported Symptoms:  Cognitive Cognitive Status: Alert and oriented to person, place,  and time, Insightful and able to interpret abstract concepts, Incoherent or nonsensical speech Cognitive/Intellectual Conditions Management [RPT]: None reported or documented in medical history or problem list      Neurological Neurological Review of Symptoms: Not assessed    HEENT HEENT Symptoms Reported: Not assessed      Cardiovascular Cardiovascular Symptoms Reported: No symptoms reported Does  patient have uncontrolled Hypertension?: No Cardiovascular Conditions: Hypertension Cardiovascular Management Strategies: Routine screening, Medication therapy Cardiovascular Self-Management Outcome: 4 (good) Cardiovascular Comment: Patient reports most recent blood pressure readings have ranged from 150-170's/50-60's. Denies any change in treatment plan. Reports most recent follow up visit with primary care provider was 01/31/24  Respiratory Respiratory Symptoms Reported: Not assesed    Endocrine Patient reports the following symptoms related to hypoglycemia or hyperglycemia : No symptoms reported Is patient diabetic?: Yes Is patient checking blood sugars at home?: Yes Endocrine Conditions: Diabetes Endocrine Management Strategies: Routine screening, Medication therapy, Medical device, Diet modification Endocrine Self-Management Outcome: 4 (good)  Gastrointestinal Gastrointestinal Symptoms Reported: Not assessed      Genitourinary Genitourinary Symptoms Reported: No symptoms reported Genitourinary Conditions: End-stage renal disease Genitourinary Management Strategies: Peritoneal dialysis Peritoneal Dialysis Schedule: at [facility] daily. Reports on dialysis 10 hours per night. Peritoneal Dialysis Last Treatment: 02/08/24 Genitourinary Comment: patient reports transitioning to peritoneal dialysis on 01/31/24.  She states she has been doing well since managing dialysis at home and denies any issues or concerns. Patient states she is seen at the dialysis center 1 x month for labs. She reports her peritoneal dialysis is done nightly.  Integumentary Integumentary Symptoms Reported: Not assessed    Musculoskeletal Musculoskelatal Symptoms Reviewed: Not assessed        Psychosocial Psychosocial Symptoms Reported: Not assessed     Quality of Family Relationships: supportive Do you feel physically threatened by others?: No      01/31/2024    3:38 PM  Depression screen PHQ 2/9  Decreased Interest  0  Down, Depressed, Hopeless 0  PHQ - 2 Score 0  Altered sleeping 0  Tired, decreased energy 0  Change in appetite 0  Feeling bad or failure about yourself  0  Trouble concentrating 0  Moving slowly or fidgety/restless 0  Suicidal thoughts 0  PHQ-9 Score 0  Difficult doing work/chores Not difficult at all    Vitals:   02/09/24 1449  BP: (!) 150/60    Medications Reviewed Today     Reviewed by Krystal Teachey E, RN (Registered Nurse) on 02/09/24 at 1452  Med List Status: <None>   Medication Order Taking? Sig Documenting Provider Last Dose Status Informant  acetaminophen  (TYLENOL ) 500 MG tablet [id number] Yes Take 1,000 mg by mouth every 6 (six) hours as needed for mild pain. [provider] Taking Active Self  albuterol  (VENTOLIN  HFA) 108 (90 Base) MCG/ACT inhaler [id number] Yes INHALE 2 PUFFS INTO THE LUNGS UP TO EVERY 6HRS AS NEEDED FOR WHEEZING/SHORTNESS OF BREATH Clark, Katherine K, NP Taking Active Self  allopurinol  (ZYLOPRIM ) 100 MG tablet [id number] Yes Take 1 tablet (100 mg total) by mouth daily. For gout prevention Clark, Katherine K, NP Taking Active   amLODipine  (NORVASC ) 10 MG tablet [id number] Yes Take 10 mg by mouth daily. [provider] Taking Active Self  aspirin  EC 81 MG tablet [id number] Yes Take 81 mg by mouth daily. [provider] Taking Active Self  AURYXIA  1 GM 210 MG(Fe) tablet [id number] Yes Take 210 mg by mouth 3 (three) times daily with meals. [provider] Taking Active Self  Blood Glucose Monitoring Suppl (FREESTYLE LITE) w/Device KIT [id number]  Inject 1 each into the skin daily. Maudine Sos, MD  Active Self  carvedilol  (COREG ) 12.5 MG tablet [id number] Yes Take 12.5 mg by mouth 2 (two) times daily. [provider] Taking Active Self  dolutegravir  ([name] ) 50 MG tablet [id number] Yes Take 1 tablet (50 mg total) by mouth daily. Orson Blalock, NP Taking Active Self  emtricitabine -tenofovir  AF (DESCOVY ) 200-25  MG tablet [id number] Yes Take 1 tablet by mouth daily. Orson Blalock, NP Taking Active Self  fluticasone  (FLOVENT  HFA) 110 MCG/ACT inhaler [id number] Yes Inhale 1 puff into the lungs 2 (two) times daily. [provider] Taking Active Self           Med Note Vivian Groom, MELISSA R   Fri Dec 15, 2023 12:45 PM)    Lancets Emmett Harman ULTRASOFT) lancets [id number]  Use as instructed to test blood sugar daily Clark, Katherine K, NP  Active Self  levocetirizine (XYZAL ) 5 MG tablet [id number] Yes Take 1 tablet (5 mg total) by mouth every evening. For allergies Clark, Katherine K, NP Taking Active Self  levothyroxine  (SYNTHROID ) 150 MCG tablet [id number] Yes Take 1 tablet (150 mcg total) by mouth daily before breakfast. Emilie Harden, MD Taking Active   losartan  (COZAAR ) 50 MG tablet [id number] Yes Take 50 mg by mouth daily. [provider] Taking Active Self  metroNIDAZOLE  (METROGEL ) 0.75 % gel [id number] Yes Apply 1 applicatorful PV at bedtime x 5 days Felicita Horns, FNP Taking Active   rosuvastatin  (CRESTOR ) 10 MG tablet [id number] No TAKE 1 TABLET BY MOUTH EVERY DAY FOR CHOLESTEROL  Patient not taking: Reported on 02/09/2024   Gabriel John, NP Not Taking Active Self  saxagliptin  HCl (ONGLYZA) 2.5 MG TABS tablet [id number] Yes Take 1 tablet (2.5 mg total) by mouth daily. for diabetes. Gabriel John, NP Taking Active   traMADol  (ULTRAM ) 50 MG tablet [id number] Yes Take 1 tablet (50 mg total) by mouth every 12 (twelve) hours as needed. Cordie Deters, PA-C Taking Active             Recommendation:   PCP Follow-up  Follow Up Plan:   Telephone follow-up in 1 month with RN care manager  Verba Girt RN, BSN, CCM [place]  [facility], Population Health Case Manager Phone: [phone]

## 2024-02-10 DIAGNOSIS — N2581 Secondary hyperparathyroidism of renal origin: Secondary | ICD-10-CM | POA: Diagnosis not present

## 2024-02-10 DIAGNOSIS — N186 End stage renal disease: Secondary | ICD-10-CM | POA: Diagnosis not present

## 2024-02-10 DIAGNOSIS — Z992 Dependence on renal dialysis: Secondary | ICD-10-CM | POA: Diagnosis not present

## 2024-02-11 DIAGNOSIS — N2581 Secondary hyperparathyroidism of renal origin: Secondary | ICD-10-CM | POA: Diagnosis not present

## 2024-02-11 DIAGNOSIS — N186 End stage renal disease: Secondary | ICD-10-CM | POA: Diagnosis not present

## 2024-02-11 DIAGNOSIS — Z992 Dependence on renal dialysis: Secondary | ICD-10-CM | POA: Diagnosis not present

## 2024-02-12 DIAGNOSIS — N186 End stage renal disease: Secondary | ICD-10-CM | POA: Diagnosis not present

## 2024-02-12 DIAGNOSIS — Z992 Dependence on renal dialysis: Secondary | ICD-10-CM | POA: Diagnosis not present

## 2024-02-12 DIAGNOSIS — N2581 Secondary hyperparathyroidism of renal origin: Secondary | ICD-10-CM | POA: Diagnosis not present

## 2024-02-13 DIAGNOSIS — Z992 Dependence on renal dialysis: Secondary | ICD-10-CM | POA: Diagnosis not present

## 2024-02-13 DIAGNOSIS — N2581 Secondary hyperparathyroidism of renal origin: Secondary | ICD-10-CM | POA: Diagnosis not present

## 2024-02-13 DIAGNOSIS — N186 End stage renal disease: Secondary | ICD-10-CM | POA: Diagnosis not present

## 2024-02-14 ENCOUNTER — Other Ambulatory Visit: Payer: Self-pay

## 2024-02-14 ENCOUNTER — Ambulatory Visit

## 2024-02-14 DIAGNOSIS — N2581 Secondary hyperparathyroidism of renal origin: Secondary | ICD-10-CM | POA: Diagnosis not present

## 2024-02-14 DIAGNOSIS — Z992 Dependence on renal dialysis: Secondary | ICD-10-CM | POA: Diagnosis not present

## 2024-02-14 DIAGNOSIS — E1122 Type 2 diabetes mellitus with diabetic chronic kidney disease: Secondary | ICD-10-CM | POA: Diagnosis not present

## 2024-02-14 DIAGNOSIS — N186 End stage renal disease: Secondary | ICD-10-CM | POA: Diagnosis not present

## 2024-02-14 NOTE — Progress Notes (Signed)
 The ASCVD Risk score (Arnett DK, et al., 2019) failed to calculate for the following reasons:   The valid total cholesterol range is 130 to 320 mg/dL  Arlon Bergamo, BSN, RN

## 2024-02-15 ENCOUNTER — Other Ambulatory Visit: Payer: Self-pay | Admitting: Primary Care

## 2024-02-15 DIAGNOSIS — N2581 Secondary hyperparathyroidism of renal origin: Secondary | ICD-10-CM | POA: Diagnosis not present

## 2024-02-15 DIAGNOSIS — Z992 Dependence on renal dialysis: Secondary | ICD-10-CM | POA: Diagnosis not present

## 2024-02-15 DIAGNOSIS — E1169 Type 2 diabetes mellitus with other specified complication: Secondary | ICD-10-CM

## 2024-02-15 DIAGNOSIS — N186 End stage renal disease: Secondary | ICD-10-CM | POA: Diagnosis not present

## 2024-02-15 MED ORDER — ONETOUCH ULTRASOFT LANCETS MISC: 5 refills | Status: DC

## 2024-02-16 ENCOUNTER — Ambulatory Visit

## 2024-02-16 DIAGNOSIS — N2581 Secondary hyperparathyroidism of renal origin: Secondary | ICD-10-CM | POA: Diagnosis not present

## 2024-02-16 DIAGNOSIS — N186 End stage renal disease: Secondary | ICD-10-CM | POA: Diagnosis not present

## 2024-02-16 DIAGNOSIS — Z992 Dependence on renal dialysis: Secondary | ICD-10-CM | POA: Diagnosis not present

## 2024-02-17 DIAGNOSIS — N186 End stage renal disease: Secondary | ICD-10-CM | POA: Diagnosis not present

## 2024-02-17 DIAGNOSIS — Z992 Dependence on renal dialysis: Secondary | ICD-10-CM | POA: Diagnosis not present

## 2024-02-17 DIAGNOSIS — N2581 Secondary hyperparathyroidism of renal origin: Secondary | ICD-10-CM | POA: Diagnosis not present

## 2024-02-18 DIAGNOSIS — Z992 Dependence on renal dialysis: Secondary | ICD-10-CM | POA: Diagnosis not present

## 2024-02-18 DIAGNOSIS — N2581 Secondary hyperparathyroidism of renal origin: Secondary | ICD-10-CM | POA: Diagnosis not present

## 2024-02-18 DIAGNOSIS — N186 End stage renal disease: Secondary | ICD-10-CM | POA: Diagnosis not present

## 2024-02-19 DIAGNOSIS — N2581 Secondary hyperparathyroidism of renal origin: Secondary | ICD-10-CM | POA: Diagnosis not present

## 2024-02-19 DIAGNOSIS — N186 End stage renal disease: Secondary | ICD-10-CM | POA: Diagnosis not present

## 2024-02-19 DIAGNOSIS — Z992 Dependence on renal dialysis: Secondary | ICD-10-CM | POA: Diagnosis not present

## 2024-02-20 DIAGNOSIS — Z992 Dependence on renal dialysis: Secondary | ICD-10-CM | POA: Diagnosis not present

## 2024-02-20 DIAGNOSIS — N2581 Secondary hyperparathyroidism of renal origin: Secondary | ICD-10-CM | POA: Diagnosis not present

## 2024-02-20 DIAGNOSIS — N186 End stage renal disease: Secondary | ICD-10-CM | POA: Diagnosis not present

## 2024-02-21 ENCOUNTER — Ambulatory Visit
Admission: RE | Admit: 2024-02-21 | Discharge: 2024-02-21 | Disposition: A | Discharge status: Home / Self Care | Source: Ambulatory Visit | Attending: Hematology and Oncology

## 2024-02-21 DIAGNOSIS — Z992 Dependence on renal dialysis: Secondary | ICD-10-CM | POA: Diagnosis not present

## 2024-02-21 DIAGNOSIS — Z1231 Encounter for screening mammogram for malignant neoplasm of breast: Secondary | ICD-10-CM | POA: Diagnosis not present

## 2024-02-21 DIAGNOSIS — C50311 Malignant neoplasm of lower-inner quadrant of right female breast: Secondary | ICD-10-CM

## 2024-02-21 DIAGNOSIS — N186 End stage renal disease: Secondary | ICD-10-CM | POA: Diagnosis not present

## 2024-02-21 DIAGNOSIS — N2581 Secondary hyperparathyroidism of renal origin: Secondary | ICD-10-CM | POA: Diagnosis not present

## 2024-02-22 ENCOUNTER — Other Ambulatory Visit: Payer: Self-pay

## 2024-02-22 DIAGNOSIS — Z992 Dependence on renal dialysis: Secondary | ICD-10-CM | POA: Diagnosis not present

## 2024-02-22 DIAGNOSIS — N2581 Secondary hyperparathyroidism of renal origin: Secondary | ICD-10-CM | POA: Diagnosis not present

## 2024-02-22 DIAGNOSIS — N186 End stage renal disease: Secondary | ICD-10-CM | POA: Diagnosis not present

## 2024-02-22 NOTE — Patient Outreach (Signed)
 Complex Care Management   Visit Note  02/22/2024  Name:  [name] MRN: [id number] DOB: 11-27-1965  Situation: Referral received for Complex Care Management related to ESRD and HTN I obtained verbal consent from Patient.  Visit completed with patient  on the phone  Background:   Past Medical History:  Diagnosis Date   Acute pain of right shoulder due to trauma 06/08/2023   Acute pancreatitis 10/23/2021   Acute renal failure superimposed on stage 4 chronic kidney disease (HCC) 10/24/2021   Allergy    Anemia    Normocytic   Antibiotic-induced yeast infection 09/28/2020   Anxiety    Asthma    Bronchitis 2005   Bursitis of left shoulder 09/06/2019   CKD (chronic kidney disease)    Dialysis on Mon-Wed- Fri   CLASS 1-EXOPHTHALMOS-THYROTOXIC 02/08/2007   Cognitive dysfunction 02/21/2020   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  Genes Analyzed (43 total): APC*, ATM*, AXIN2, BARD1, BMPR1A, BRCA1*, BRCA2*, BRIP1*, CDH1*, CDK4, CDKN2A, CHEK2*, DICER1, GALNT12, HOXB13, MEN1, MLH1*, MRE11A, MSH2*, MSH3, MSH6*, MUTYH*, NBN, NF1*, NTHL1, PALB2*, PMS2*, POLD1, POLE, PTEN*, RAD50, RAD51C*, RAD51D*, RET, SDHB, SDHD, SMAD4, SMARCA4, STK11 and TP53* (sequencing and deletion/duplication); EGFR (s   GERD 07/24/2006   GRAVE'S DISEASE 01/01/2008   History of hidradenitis suppurativa    History of kidney stones     History of thrush    HIV DISEASE 07/24/2006   dx March 05   Hyperlipidemia    HYPERTENSION 07/24/2006   Hyperthyroidism 08/2006   Grave's Disease -diffuse radiotracer uptake 08/25/06 Thyroid  scan-Cold nodule to R lower lobe of thyrorid   Ileus (HCC) 02/14/2023   Left bundle branch block (LBBB)    Malnutrition of moderate degree (HCC) 02/08/2023   Menometrorrhagia    hx of   Nephrolithiasis    Panniculitis 05/12/2020   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 Completed 2 yrs Prednisone  after lung bx confirmation. No symptoms since then.   Sebaceous cyst 04/21/2020   Suppurative hidradenitis    Thyroid  cancer (HCC)    THYROID  NODULE, RIGHT 02/08/2007    Assessment: Patient Reported Symptoms:  Cognitive Cognitive Status: Alert and oriented to person, place, and time, Insightful and able to interpret abstract concepts, Normal speech and language skills      Neurological Neurological Review of Symptoms:  No symptoms reported    HEENT HEENT Symptoms Reported: Runny nose HEENT Conditions:  (seasonal Allergies) HEENT Management Strategies: Routine screening, Medication therapy HEENT Self-Management Outcome: 4 (good)  (seasonal Allergies)  Cardiovascular Cardiovascular Symptoms Reported: No symptoms reported Does patient have uncontrolled Hypertension?: No Cardiovascular Conditions:  Hypertension Cardiovascular Management Strategies: Routine screening, Medication therapy Weight: 174 lb (78.9 kg) Cardiovascular Self-Management Outcome: 4 (good)  Respiratory Respiratory Symptoms Reported: Shortness of breath (with exertion) Respiratory Conditions: Asthma Respiratory Self-Management Outcome: 4 (good)  Endocrine Patient reports the following symptoms related to hypoglycemia or hyperglycemia : No symptoms reported Is patient diabetic?: Yes Is patient checking blood sugars at home?: Yes Endocrine Conditions: Diabetes Endocrine Management Strategies: Routine screening, Medication therapy Endocrine Self-Management Outcome: 4 (good)  Gastrointestinal Gastrointestinal Symptoms Reported: No symptoms reported   Nutrition Risk Screen (CP): No indicators present  Genitourinary Genitourinary Symptoms Reported: No symptoms reported    Integumentary Integumentary Symptoms Reported: No symptoms reported    Musculoskeletal Musculoskelatal Symptoms Reviewed: No symptoms reported        Psychosocial Psychosocial Symptoms Reported: No symptoms reported     Quality of Family Relationships: supportive Do you feel physically threatened by others?: No      02/22/2024   11:23 AM  Depression screen PHQ 2/9  Decreased Interest 0  Down, Depressed, Hopeless 0  PHQ - 2 Score 0    Vitals:   02/22/24 1121  BP: (!) 167/54    Medications Reviewed Today     Reviewed by Kayson Tasker E, RN (Registered Nurse) on 02/22/24 at 1142  Med List Status: <None>   Medication Order Taking? Sig Documenting Provider Last Dose Status Informant  acetaminophen  (TYLENOL ) 500 MG tablet [id number] No Take 1,000 mg by mouth every 6 (six) hours as needed for mild pain. [provider] Taking Active Self  albuterol  (VENTOLIN  HFA) 108 (90 Base) MCG/ACT inhaler [id number] No INHALE 2 PUFFS INTO THE LUNGS UP TO EVERY 6HRS AS NEEDED FOR WHEEZING/SHORTNESS OF BREATH Clark, Katherine K, NP Taking Active  Self  allopurinol  (ZYLOPRIM ) 100 MG tablet [id number] No Take 1 tablet (100 mg total) by mouth daily. For gout prevention Clark, Katherine K, NP Taking Active   amLODipine  (NORVASC ) 10 MG tablet [id number] No Take 10 mg by mouth daily. [provider] Taking Active Self  aspirin  EC 81 MG tablet [id number] No Take 81 mg by mouth daily. [provider] Taking Active Self  AURYXIA  1 GM 210 MG(Fe) tablet [id number] No Take 210 mg by mouth 3 (three) times daily with meals. [provider] Taking Active Self  Blood Glucose Monitoring Suppl (FREESTYLE LITE) w/Device KIT [id number] No Inject 1 each into the skin daily. Maudine Sos, MD Taking Active Self  carvedilol  (COREG ) 12.5 MG tablet [id number] No Take 12.5 mg by mouth 2 (two) times daily. [provider] Taking Active Self  dolutegravir  ([name] ) 50 MG tablet [id number] No Take 1 tablet (50 mg total) by mouth daily. Orson Blalock, NP Taking Active Self  emtricitabine -tenofovir  AF (DESCOVY ) 200-25 MG tablet [id number] No Take 1 tablet by mouth daily. Orson Blalock, NP Taking Active Self  fluticasone  (FLOVENT  HFA) 110 MCG/ACT inhaler [id number] No Inhale 1 puff into the lungs 2 (two) times daily. [provider] Taking Active Self           Med Note Vivian Groom, MELISSA R   Fri Dec 15, 2023 12:45 PM)    Lancets Emmett Harman ULTRASOFT) lancets [id number]  Use as instructed to test blood sugar daily [place] Job,  Katherine K, NP  Active   levocetirizine (XYZAL ) 5 MG tablet [id number] No Take 1 tablet (5 mg total) by mouth every evening. For allergies Clark, Katherine K, NP Taking Active Self  levothyroxine  (SYNTHROID ) 150 MCG tablet [id number] No Take 1 tablet (150 mcg total) by mouth daily before breakfast. Emilie Harden, MD Taking Active   losartan  (COZAAR ) 50 MG tablet [id number] No Take 50 mg by mouth daily. [provider] Taking Active Self  metroNIDAZOLE  (METROGEL ) 0.75 % gel [id number] No Apply 1  applicatorful PV at bedtime x 5 days Felicita Horns, FNP Taking Active   rosuvastatin  (CRESTOR ) 10 MG tablet [id number] No TAKE 1 TABLET BY MOUTH EVERY DAY FOR CHOLESTEROL  Patient not taking: Reported on 02/09/2024   Gabriel John, NP Not Taking Active Self  saxagliptin  HCl (ONGLYZA) 2.5 MG TABS tablet [id number] No Take 1 tablet (2.5 mg total) by mouth daily. for diabetes. Gabriel John, NP Taking Active   traMADol  (ULTRAM ) 50 MG tablet [id number] No Take 1 tablet (50 mg total) by mouth every 12 (twelve) hours as needed. Cordie Deters, PA-C Taking Active             Recommendation:   PCP Follow-up  Follow Up Plan:   Telephone follow-up in 1 month with RN case manager  Verba Girt RN, BSN, CCM [place]  [facility], Population Health Case Manager Phone: [phone]

## 2024-02-22 NOTE — Patient Instructions (Signed)
 Visit Information  Thank you for taking time to visit with me today. Please don't hesitate to contact me if I can be of assistance to you before our next scheduled appointment.  Your next care management appointment is by telephone on 04/04/23 at 11 am   Please call the care guide team at [phone] if you need to cancel, schedule, or reschedule an appointment.   Please call the Suicide and Crisis Lifeline: 988 call 1-800-273-TALK (toll free, 24 hour hotline) if you are experiencing a Mental Health or Behavioral Health Crisis or need someone to talk to.  Arlana Canizales RN, BSN, CCM [place]  [facility], Population Health Case Manager Phone: [phone]  Type 2 Diabetes Mellitus, Self-Care, Adult Caring for yourself after you have been diagnosed with type 2 diabetes (type 2 diabetes mellitus) means keeping your blood sugar (glucose) under control with a balance of: Nutrition. Exercise. Lifestyle changes. Medicines or insulin , if needed. Support from your team of health care providers and others. What are the risks? Having type 2 diabetes can put you at risk for other long-term (chronic) conditions, such as heart disease and kidney disease. Your health care provider may prescribe medicines to help prevent complications from diabetes. How to monitor your blood glucose  Check your blood glucose every day or as often as told by your health care provider. Have your A1C (hemoglobin A1C) level checked two or more times a year, or as often as told by your health care provider. Your health care provider will set personalized treatment goals for you. Generally, the goal of treatment is to maintain the following blood glucose levels: Before meals: 80-130 mg/dL (4.4-7.2 mmol/L). After meals: below 180 mg/dL (10 mmol/L). A1C level: less than 7%. How to manage hyperglycemia and hypoglycemia Hyperglycemia symptoms Hyperglycemia, also called high blood glucose, occurs when  blood glucose is too high. Make sure you know the early signs of hyperglycemia, such as: Increased thirst. Hunger. Feeling very tired. Needing to urinate more often than usual. Blurry vision. Hypoglycemia symptoms Hypoglycemia, also called low blood glucose, occurs with a blood glucose level at or below 70 mg/dL (3.9 mmol/L). Diabetes medicines lower your blood glucose and can cause hypoglycemia. The risk for hypoglycemia increases during or after exercise, during sleep, during illness, and when skipping meals or not eating for a long time (fasting). It is important to know the symptoms of hypoglycemia and treat it right away. Always have a 15-gram rapid-acting carbohydrate snack with you to treat low blood glucose. Family members and close friends should also know the symptoms and understand how to treat hypoglycemia, in case you are not able to treat yourself. Symptoms may include: Hunger. Anxiety. Sweating and feeling clammy. Dizziness or feeling light-headed. Sleepiness. Increased heart rate. Irritability. Tingling or numbness around the mouth, lips, or tongue. Restless sleep. Severe hypoglycemia is when your blood glucose level is at or below 54 mg/dL (3 mmol/L). Severe hypoglycemia is an emergency. Do not wait to see if the symptoms will go away. Get medical help right away. Call your local emergency services (911 in the [place] Do not drive yourself to the hospital. If you have severe hypoglycemia and you cannot eat or drink, you may need glucagon. A family member or close friend should learn how to check your blood glucose and how to give you glucagon. Ask your health care provider if you need to have an emergency glucagon kit available. Follow these instructions at home: Medicines Take prescribed insulin  or diabetes medicines as  told by your health care provider. Do not run out of insulin  or other diabetes medicines. Plan ahead so you always have these available. If you use insulin ,  adjust your dosage based on your physical activity and what foods you eat. Your health care provider will tell you how to adjust your dosage. Take over-the-counter and prescription medicines only as told by your health care provider. Eating and drinking  What you eat and drink affects your blood glucose and your insulin  dosage. Making good choices helps to control your diabetes and prevent other health problems. A healthy meal plan includes eating lean proteins, complex carbohydrates, fresh fruits and vegetables, low-fat dairy products, and healthy fats. Make an appointment to see a registered dietitian to help you create an eating plan that is right for you. Make sure that you: Follow instructions from your health care provider about eating or drinking restrictions. Drink enough fluid to keep your urine pale yellow. Keep a record of the carbohydrates that you eat. Do this by reading food labels and learning the standard serving sizes of foods. Follow your sick-day plan whenever you cannot eat or drink as usual. Make this plan in advance with your health care provider.  Activity Stay active. Exercise regularly, as told by your health care provider. This may include: Stretching and doing strength exercises, such as yoga or weight lifting, two or more times a week. Doing 150 minutes or more of moderate-intensity or vigorous-intensity exercise each week. This could be brisk walking, biking, or water  aerobics. Spread out your activity over 3 or more days of the week. Do not go more than 2 days in a row without doing some kind of physical activity. When you start a new exercise or activity, work with your health care provider to adjust your insulin , medicines, or food intake as needed. Lifestyle Do not use any products that contain nicotine  or tobacco. These products include cigarettes, chewing tobacco, and vaping devices, such as e-cigarettes. If you need help quitting, ask your health care  provider. If you drink alcohol  and your health care provider says that it is safe for you: Limit how much you have to: 0-1 drink a day for women who are not pregnant. 0-2 drinks a day for men. Know how much alcohol  is in your drink. In the [place] one drink equals one 12 oz bottle of beer (355 mL), one 5 oz glass of wine (148 mL), or one 1 oz glass of hard liquor (44 mL). Learn to manage stress. If you need help with this, ask your health care provider. Take care of your body  Keep your immunizations up to date. In addition to getting vaccinations as told by your health care provider, it is recommended that you get vaccinated against the following illnesses: The flu (influenza). Get a flu shot every year. Pneumonia. Hepatitis B. Schedule an eye exam soon after your diagnosis, and then one time every year after that. Check your skin and feet every day for cuts, bruises, redness, blisters, or sores. Schedule a foot exam with your health care provider once every year. Brush your teeth and gums two times a day, and floss one or more times a day. Visit your dentist one or more times every 6 months. Maintain a healthy weight. General instructions Share your diabetes management plan with people in your workplace, school, and household. Carry a medical alert card or wear medical alert jewelry. Keep all follow-up visits. This is important. Questions to ask your health care provider  Should I meet with a certified diabetes care and education specialist? Where can I find a support group for people with diabetes? Where to find more information For help and guidance and for more information about diabetes, please visit: American Diabetes Association (ADA): www.diabetes.org American Association of Diabetes Care and Education Specialists (ADCES): www.diabeteseducator.org International Diabetes Federation (IDF): DCOnly.dk Summary Caring for yourself after you have been diagnosed with type 2 diabetes  (type 2 diabetes mellitus) means keeping your blood sugar (glucose) under control with a balance of nutrition, exercise, lifestyle changes, and medicine. Check your blood glucose every day, as often as told by your health care provider. Having diabetes can put you at risk for other long-term (chronic) conditions, such as heart disease and kidney disease. Your health care provider may prescribe medicines to help prevent complications from diabetes. Share your diabetes management plan with people in your workplace, school, and household. Keep all follow-up visits. This is important. This information is not intended to replace advice given to you by your health care provider. Make sure you discuss any questions you have with your health care provider. Document Revised: 03/03/2021 Document Reviewed: 03/03/2021 Elsevier Patient Education  2024 [employer].

## 2024-02-23 DIAGNOSIS — N186 End stage renal disease: Secondary | ICD-10-CM | POA: Diagnosis not present

## 2024-02-23 DIAGNOSIS — Z992 Dependence on renal dialysis: Secondary | ICD-10-CM | POA: Diagnosis not present

## 2024-02-23 DIAGNOSIS — N2581 Secondary hyperparathyroidism of renal origin: Secondary | ICD-10-CM | POA: Diagnosis not present

## 2024-02-24 DIAGNOSIS — N2581 Secondary hyperparathyroidism of renal origin: Secondary | ICD-10-CM | POA: Diagnosis not present

## 2024-02-24 DIAGNOSIS — N186 End stage renal disease: Secondary | ICD-10-CM | POA: Diagnosis not present

## 2024-02-24 DIAGNOSIS — Z992 Dependence on renal dialysis: Secondary | ICD-10-CM | POA: Diagnosis not present

## 2024-02-25 DIAGNOSIS — N186 End stage renal disease: Secondary | ICD-10-CM | POA: Diagnosis not present

## 2024-02-25 DIAGNOSIS — N2581 Secondary hyperparathyroidism of renal origin: Secondary | ICD-10-CM | POA: Diagnosis not present

## 2024-02-25 DIAGNOSIS — Z992 Dependence on renal dialysis: Secondary | ICD-10-CM | POA: Diagnosis not present

## 2024-02-26 ENCOUNTER — Encounter: Payer: Self-pay | Admitting: Nurse Practitioner

## 2024-02-26 ENCOUNTER — Ambulatory Visit (INDEPENDENT_AMBULATORY_CARE_PROVIDER_SITE_OTHER): Admitting: Nurse Practitioner

## 2024-02-26 VITALS — BP 128/88 | HR 68 | Temp 97.5°F | Ht 65.0 in | Wt 177.6 lb

## 2024-02-26 DIAGNOSIS — M542 Cervicalgia: Secondary | ICD-10-CM

## 2024-02-26 DIAGNOSIS — D849 Immunodeficiency, unspecified: Secondary | ICD-10-CM

## 2024-02-26 DIAGNOSIS — Z992 Dependence on renal dialysis: Secondary | ICD-10-CM | POA: Diagnosis not present

## 2024-02-26 DIAGNOSIS — N2581 Secondary hyperparathyroidism of renal origin: Secondary | ICD-10-CM | POA: Diagnosis not present

## 2024-02-26 DIAGNOSIS — N186 End stage renal disease: Secondary | ICD-10-CM | POA: Diagnosis not present

## 2024-02-26 NOTE — Progress Notes (Signed)
 Acute Office Visit  Subjective:    Patient ID: [name], female    DOB: [birth year], 58 y.o.   MRN: [id number]  Chief Complaint  Patient presents with   Ear Pain    Right ear "feeling funny since Friday, feels like a tooth ache and face is swollen"    HPI Patient is in today for right ear pain since Friday. Denies any congestion or hearing loss or head injury or teeth pain or sore throat or fever or recent dental procedure or hx of clenching jaw/grinding teeth, no rash, or hearing loss or tinnitus.  Outpatient Medications Prior to Visit  Medication Sig   acetaminophen  (TYLENOL ) 500 MG tablet Take 1,000 mg by mouth every 6 (six) hours as needed for mild pain.   albuterol  (VENTOLIN  HFA) 108 (90 Base) MCG/ACT inhaler INHALE 2 PUFFS INTO THE LUNGS UP TO EVERY 6HRS AS NEEDED FOR WHEEZING/SHORTNESS OF BREATH   allopurinol  (ZYLOPRIM ) 100 MG tablet Take 1 tablet (100 mg total) by mouth daily. For gout prevention   amLODipine  (NORVASC ) 10 MG tablet Take 10 mg by mouth daily.   aspirin  EC 81 MG tablet Take 81 mg by mouth daily.   AURYXIA  1 GM 210 MG(Fe) tablet Take 210 mg by mouth 3 (three) times daily with meals.   Blood Glucose Monitoring Suppl (FREESTYLE LITE) w/Device KIT Inject 1 each into the skin daily.   carvedilol  (COREG ) 12.5 MG tablet Take 12.5 mg by mouth 2 (two) times daily.   dolutegravir  ([name] ) 50 MG tablet Take 1 tablet (50 mg total) by mouth daily.   emtricitabine -tenofovir  AF (DESCOVY ) 200-25 MG tablet Take 1 tablet by mouth daily.   fluticasone  (FLOVENT  HFA) 110 MCG/ACT inhaler Inhale 1 puff into the lungs 2 (two) times daily.   Lancets (ONETOUCH ULTRASOFT) lancets Use as instructed to test blood sugar daily   levocetirizine (XYZAL ) 5 MG tablet Take 1 tablet (5 mg total) by mouth every evening. For allergies   levothyroxine  (SYNTHROID ) 150 MCG tablet Take 1 tablet (150 mcg total) by mouth daily before breakfast.   losartan  (COZAAR ) 50 MG tablet Take 50 mg by  mouth daily.   saxagliptin  HCl (ONGLYZA) 2.5 MG TABS tablet Take 1 tablet (2.5 mg total) by mouth daily. for diabetes.   traMADol  (ULTRAM ) 50 MG tablet Take 1 tablet (50 mg total) by mouth every 12 (twelve) hours as needed.   metroNIDAZOLE  (METROGEL ) 0.75 % gel Apply 1 applicatorful PV at bedtime x 5 days (Patient not taking: Reported on 02/26/2024)   rosuvastatin  (CRESTOR ) 10 MG tablet TAKE 1 TABLET BY MOUTH EVERY DAY FOR CHOLESTEROL (Patient not taking: No sig reported)   No facility-administered medications prior to visit.   Reviewed past medical and social history.  Review of Systems Per HPI     Objective:    Physical Exam Vitals and nursing note reviewed.  HENT:     Right Ear: Tympanic membrane, ear canal and external ear normal.     Nose: No nasal tenderness, mucosal edema, congestion or rhinorrhea.     Right Sinus: No maxillary sinus tenderness or frontal sinus tenderness.     Mouth/Throat:     Mouth: Mucous membranes are moist.     Dentition: Abnormal dentition. Does not have dentures. No dental tenderness, gingival swelling, dental caries, dental abscesses or gum lesions.     Palate: No mass.     Pharynx: Oropharynx is clear. Uvula midline.  Cardiovascular:     Rate and Rhythm: Normal rate.  Pulses: Normal pulses.  Pulmonary:     Effort: Pulmonary effort is normal.  Musculoskeletal:     Right shoulder: Normal.     Cervical back: Normal range of motion and neck supple. Tenderness present. No rigidity or crepitus. Pain with movement present. Normal range of motion.  Lymphadenopathy:     Cervical: No cervical adenopathy.  Skin:    Findings: No erythema or rash.  Neurological:     Mental Status: She is alert and oriented to person, place, and time.    BP 128/88 (BP Location: Left Arm, Patient Position: Sitting, Cuff Size: Large)   Pulse 68   Temp (!) 97.5 F (36.4 C) (Temporal)   Ht 5\' 5"  (1.651 m)   Wt 177 lb 9.6 oz (80.6 kg)   LMP 03/31/2014 (LMP Unknown)    SpO2 100%   BMI 29.55 kg/m    No results found for any visits on 02/26/24.     Assessment & Plan:   Problem List Items Addressed This Visit   None Visit Diagnoses       Neck pain    -  Primary   Relevant Orders   C-reactive protein   CBC with Differential/Platelet   Sedimentation rate     Immunocompromised (HCC)       Relevant Orders   C-reactive protein   CBC with Differential/Platelet   Sedimentation rate      Possibly related to muscle strain vs TMJ? Unable to use NSAID due to CKD Advised to use tylenol  and warm compress prn. Check cbc, esr and crp due to immunocompromised state. No orders of the defined types were placed in this encounter.  Return if symptoms worsen or fail to improve.    Kathrene Parents, NP

## 2024-02-26 NOTE — Patient Instructions (Signed)
 Go to lab Use tylenol  500-1000mg  every 8hrs as needed for pain Use warm compress as needed

## 2024-02-27 ENCOUNTER — Telehealth: Payer: Self-pay

## 2024-02-27 DIAGNOSIS — N2581 Secondary hyperparathyroidism of renal origin: Secondary | ICD-10-CM | POA: Diagnosis not present

## 2024-02-27 DIAGNOSIS — Z992 Dependence on renal dialysis: Secondary | ICD-10-CM | POA: Diagnosis not present

## 2024-02-27 DIAGNOSIS — N186 End stage renal disease: Secondary | ICD-10-CM | POA: Diagnosis not present

## 2024-02-27 NOTE — Telephone Encounter (Signed)
 Disability forms were faxed on 4/15, refaxed today 5/13. Patient notified.

## 2024-02-27 NOTE — Telephone Encounter (Signed)
 Copied from CRM [id number]. Topic: General - Other >> Feb 27, 2024  2:37 PM Aisha D wrote: Reason for CRM: Pt stated that her disability stated that they have not received the medical authorization forms. Pt is wanting to check the status to see if they have been faxed and if not to please fax them to the disability office. Fax number is [phone], and pt stated she would like for someone to give her a call back today regarding this concern.

## 2024-02-28 DIAGNOSIS — N186 End stage renal disease: Secondary | ICD-10-CM | POA: Diagnosis not present

## 2024-02-28 DIAGNOSIS — Z992 Dependence on renal dialysis: Secondary | ICD-10-CM | POA: Diagnosis not present

## 2024-02-28 DIAGNOSIS — N2581 Secondary hyperparathyroidism of renal origin: Secondary | ICD-10-CM | POA: Diagnosis not present

## 2024-02-29 DIAGNOSIS — N2581 Secondary hyperparathyroidism of renal origin: Secondary | ICD-10-CM | POA: Diagnosis not present

## 2024-02-29 DIAGNOSIS — Z992 Dependence on renal dialysis: Secondary | ICD-10-CM | POA: Diagnosis not present

## 2024-02-29 DIAGNOSIS — N186 End stage renal disease: Secondary | ICD-10-CM | POA: Diagnosis not present

## 2024-03-01 DIAGNOSIS — N2581 Secondary hyperparathyroidism of renal origin: Secondary | ICD-10-CM | POA: Diagnosis not present

## 2024-03-01 DIAGNOSIS — N186 End stage renal disease: Secondary | ICD-10-CM | POA: Diagnosis not present

## 2024-03-01 DIAGNOSIS — Z992 Dependence on renal dialysis: Secondary | ICD-10-CM | POA: Diagnosis not present

## 2024-03-02 DIAGNOSIS — N2581 Secondary hyperparathyroidism of renal origin: Secondary | ICD-10-CM | POA: Diagnosis not present

## 2024-03-02 DIAGNOSIS — N186 End stage renal disease: Secondary | ICD-10-CM | POA: Diagnosis not present

## 2024-03-02 DIAGNOSIS — Z992 Dependence on renal dialysis: Secondary | ICD-10-CM | POA: Diagnosis not present

## 2024-03-03 DIAGNOSIS — N2581 Secondary hyperparathyroidism of renal origin: Secondary | ICD-10-CM | POA: Diagnosis not present

## 2024-03-03 DIAGNOSIS — N186 End stage renal disease: Secondary | ICD-10-CM | POA: Diagnosis not present

## 2024-03-03 DIAGNOSIS — Z992 Dependence on renal dialysis: Secondary | ICD-10-CM | POA: Diagnosis not present

## 2024-03-04 DIAGNOSIS — N186 End stage renal disease: Secondary | ICD-10-CM | POA: Diagnosis not present

## 2024-03-04 DIAGNOSIS — Z992 Dependence on renal dialysis: Secondary | ICD-10-CM | POA: Diagnosis not present

## 2024-03-04 DIAGNOSIS — N2581 Secondary hyperparathyroidism of renal origin: Secondary | ICD-10-CM | POA: Diagnosis not present

## 2024-03-05 DIAGNOSIS — N186 End stage renal disease: Secondary | ICD-10-CM | POA: Diagnosis not present

## 2024-03-05 DIAGNOSIS — Z992 Dependence on renal dialysis: Secondary | ICD-10-CM | POA: Diagnosis not present

## 2024-03-05 DIAGNOSIS — N2581 Secondary hyperparathyroidism of renal origin: Secondary | ICD-10-CM | POA: Diagnosis not present

## 2024-03-06 DIAGNOSIS — N186 End stage renal disease: Secondary | ICD-10-CM | POA: Diagnosis not present

## 2024-03-06 DIAGNOSIS — N2581 Secondary hyperparathyroidism of renal origin: Secondary | ICD-10-CM | POA: Diagnosis not present

## 2024-03-06 DIAGNOSIS — Z992 Dependence on renal dialysis: Secondary | ICD-10-CM | POA: Diagnosis not present

## 2024-03-07 DIAGNOSIS — N2581 Secondary hyperparathyroidism of renal origin: Secondary | ICD-10-CM | POA: Diagnosis not present

## 2024-03-07 DIAGNOSIS — N186 End stage renal disease: Secondary | ICD-10-CM | POA: Diagnosis not present

## 2024-03-07 DIAGNOSIS — Z992 Dependence on renal dialysis: Secondary | ICD-10-CM | POA: Diagnosis not present

## 2024-03-08 DIAGNOSIS — N2581 Secondary hyperparathyroidism of renal origin: Secondary | ICD-10-CM | POA: Diagnosis not present

## 2024-03-08 DIAGNOSIS — N186 End stage renal disease: Secondary | ICD-10-CM | POA: Diagnosis not present

## 2024-03-08 DIAGNOSIS — Z992 Dependence on renal dialysis: Secondary | ICD-10-CM | POA: Diagnosis not present

## 2024-03-09 DIAGNOSIS — Z992 Dependence on renal dialysis: Secondary | ICD-10-CM | POA: Diagnosis not present

## 2024-03-09 DIAGNOSIS — N2581 Secondary hyperparathyroidism of renal origin: Secondary | ICD-10-CM | POA: Diagnosis not present

## 2024-03-09 DIAGNOSIS — N186 End stage renal disease: Secondary | ICD-10-CM | POA: Diagnosis not present

## 2024-03-10 DIAGNOSIS — N2581 Secondary hyperparathyroidism of renal origin: Secondary | ICD-10-CM | POA: Diagnosis not present

## 2024-03-10 DIAGNOSIS — Z992 Dependence on renal dialysis: Secondary | ICD-10-CM | POA: Diagnosis not present

## 2024-03-10 DIAGNOSIS — N186 End stage renal disease: Secondary | ICD-10-CM | POA: Diagnosis not present

## 2024-03-11 ENCOUNTER — Other Ambulatory Visit: Payer: Self-pay | Admitting: Internal Medicine

## 2024-03-11 DIAGNOSIS — N186 End stage renal disease: Secondary | ICD-10-CM | POA: Diagnosis not present

## 2024-03-11 DIAGNOSIS — N2581 Secondary hyperparathyroidism of renal origin: Secondary | ICD-10-CM | POA: Diagnosis not present

## 2024-03-11 DIAGNOSIS — Z992 Dependence on renal dialysis: Secondary | ICD-10-CM | POA: Diagnosis not present

## 2024-03-12 DIAGNOSIS — N186 End stage renal disease: Secondary | ICD-10-CM | POA: Diagnosis not present

## 2024-03-12 DIAGNOSIS — Z992 Dependence on renal dialysis: Secondary | ICD-10-CM | POA: Diagnosis not present

## 2024-03-12 DIAGNOSIS — N2581 Secondary hyperparathyroidism of renal origin: Secondary | ICD-10-CM | POA: Diagnosis not present

## 2024-03-13 ENCOUNTER — Other Ambulatory Visit: Payer: Self-pay

## 2024-03-13 ENCOUNTER — Encounter: Payer: Self-pay | Admitting: Infectious Diseases

## 2024-03-13 ENCOUNTER — Ambulatory Visit (INDEPENDENT_AMBULATORY_CARE_PROVIDER_SITE_OTHER): Admitting: Infectious Diseases

## 2024-03-13 VITALS — BP 100/70 | HR 83 | Temp 97.7°F | Ht 65.0 in | Wt 169.0 lb

## 2024-03-13 DIAGNOSIS — N2581 Secondary hyperparathyroidism of renal origin: Secondary | ICD-10-CM | POA: Diagnosis not present

## 2024-03-13 DIAGNOSIS — Z992 Dependence on renal dialysis: Secondary | ICD-10-CM

## 2024-03-13 DIAGNOSIS — N186 End stage renal disease: Secondary | ICD-10-CM

## 2024-03-13 DIAGNOSIS — Z21 Asymptomatic human immunodeficiency virus [HIV] infection status: Secondary | ICD-10-CM

## 2024-03-13 DIAGNOSIS — L732 Hidradenitis suppurativa: Secondary | ICD-10-CM | POA: Diagnosis not present

## 2024-03-13 MED ORDER — DOVATO 50-300 MG PO TABS: 1.0000 | ORAL_TABLET | Freq: Every day | ORAL | 11 refills | Status: DC

## 2024-03-13 MED ORDER — MINOCYCLINE HCL 100 MG PO CAPS: 100.0000 mg | ORAL_CAPSULE | Freq: Two times a day (BID) | ORAL | 5 refills | Status: DC

## 2024-03-13 NOTE — Patient Instructions (Addendum)
 DOVATO is the pill I would like for you to start taking to treat you - this will need to be taken once a day around the same time.  - Common side effects for a short time frame usually include headaches, nausea and diarrhea - OK to take over the counter tylenol  for headaches and imodium for diarrhea - Try taking with food if you are nauseated  - If you take any multivitamins or supplements please separate them from your DOVATO by 6 hours before and after.  The main thing is do not have them in the stomach at the same time.  Stop the tivicay  and descovy  today and start the dovato samples.   Please schedule a follow up in 2 months so we can repeat your viral load then after the switch so we can ensure all is working perfectly for you.   [facility] -  [address] [place],  [place]  [address] Main: [phone]

## 2024-03-13 NOTE — Progress Notes (Signed)
 Name: [name]  DOB: [birth year] MRN: [id number] PCP: Gabriel John, NP    Brief Narrative:  [name] is a 58 y.o. female with well controlled HIV, stage 2 at diagnosis, Dx in 2005.  Notable PMHx includes ESRD on HD since April 2024, peritoneal HD started 2025.  HTN, Sarcoidosis, MRSA boils/HS, hypothyroidism, breast cancer 2019 CD4 nadir > 200 but specifics not detailed  VL unknown HIV Risk: sexual History of OIs: none Intake Labs: Hep B sAg (-), sAb (+), cAb (-); Hep A (+), Hep C (-) Quantiferon () HLA B*5701 (- 2019) G6PD: ()   Previous Regimens: Atripla  2008 (terrible nightmares)  Genvoya 2016 (severe itching / papular rash all over) Odefsey  2016 (chemo interaction with RPV) [name]  + Descovy  2019  Genotypes: 2022 - wildtype  Subjective:   Chief Complaint  Patient presents with   Follow-up    Discussed the use of AI scribe software for clinical note transcription with the patient, who gave verbal consent to proceed.  History of Present Illness   [name] is a 58 year old female with HIV and hidradenitis suppurativa who presents for follow-up on her current treatment regimen.   She is currently undergoing peritoneal dialysis at home, which she finds more comfortable than previous hemodialysis sessions at a center. The port is functioning well after a replacement in March due to the initial one not working. No significant changes in health have occurred since starting peritoneal dialysis.  Hidradenitis suppurativa primarily affects her right axilla and right breast which she refers to as her 'sick breast' due to its history of cancer. She manages the condition with minocycline  100 mg taken every other day or every three days depending on symptoms. She has not had a significant lesion in years while on minocycline . She previously used doxycycline  but switched due to concerns about potential side effects, including mental health  risks. She is open to seeing dermatology for another assessment and opinion.   She is on [name]  and Descovy . She occasionally misses doses due to insurance issues with auto-refill, but her viral load was undetectable as of August last year. She wants to switch to a single-tablet regimen for convenience and hopes for easier dispense. .           03/13/2024    2:16 PM  Depression screen PHQ 2/9  Decreased Interest 1  Down, Depressed, Hopeless 0  PHQ - 2 Score 1  Altered sleeping 0  Tired, decreased energy 1  Change in appetite 1  Feeling bad or failure about yourself  0  Trouble concentrating 0  Moving slowly or fidgety/restless 0  Suicidal thoughts 0  PHQ-9 Score 3  Difficult doing work/chores Somewhat difficult     Review of Systems  Constitutional:  Negative for chills, fever, malaise/fatigue and weight loss.  HENT:  Negative for sore throat.   Respiratory:  Negative for cough, sputum production and shortness of breath.   Cardiovascular: Negative.   Gastrointestinal:  Negative for abdominal pain, diarrhea and vomiting.  Musculoskeletal:  Negative for joint pain, myalgias and neck pain.  Skin:  Negative for rash.  Neurological:  Negative for headaches.  Psychiatric/Behavioral:  Negative for depression and substance abuse. The patient is not nervous/anxious.     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  Genes Analyzed (43 total): APC*, ATM*, AXIN2, BARD1, BMPR1A, BRCA1*, BRCA2*, BRIP1*, CDH1*, CDK4, CDKN2A, CHEK2*, DICER1, GALNT12, HOXB13, MEN1, MLH1*, MRE11A, MSH2*, MSH3, MSH6*, MUTYH*, NBN, NF1*, NTHL1, PALB2*, PMS2*, POLD1, POLE, PTEN*, RAD50, RAD51C*, RAD51D*, RET, SDHB, SDHD, SMAD4, SMARCA4, STK11 and TP53* (sequencing and deletion/duplication); EGFR (s   GERD 07/24/2006   GRAVE'S DISEASE 01/01/2008   History of hidradenitis suppurativa    History of kidney stones    History of thrush    HIV DISEASE 07/24/2006   dx March 05   Hyperlipidemia    HYPERTENSION 07/24/2006   Hyperthyroidism 08/2006   Grave's Disease -diffuse radiotracer uptake 08/25/06 Thyroid  scan-Cold nodule to R lower lobe of thyrorid   Ileus (HCC) 02/14/2023   Left bundle branch block (LBBB)    Malnutrition of moderate degree (HCC) 02/08/2023   Menometrorrhagia    hx of   Nephrolithiasis    Panniculitis 05/12/2020   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 Completed 2 yrs Prednisone  after lung bx confirmation. No symptoms since then.   Sebaceous cyst 04/21/2020   Suppurative hidradenitis    Thyroid  cancer (HCC)    THYROID  NODULE, RIGHT 02/08/2007    Outpatient Medications Prior to Visit  Medication Sig Dispense Refill   acetaminophen  (TYLENOL ) 500 MG tablet Take 1,000 mg by mouth every 6 (six) hours as needed for mild pain.     albuterol  (VENTOLIN  HFA) 108 (90 Base) MCG/ACT inhaler INHALE 2 PUFFS INTO THE LUNGS UP TO EVERY 6HRS AS NEEDED FOR WHEEZING/SHORTNESS OF BREATH 8 each 0   allopurinol  (ZYLOPRIM ) 100 MG tablet Take 1 tablet (100 mg total) by mouth daily. For gout prevention 90 tablet 0   amLODipine  (NORVASC ) 10 MG tablet Take 10 mg by mouth daily.     aspirin  EC 81 MG tablet Take 81 mg by mouth daily.     AURYXIA  1 GM 210 MG(Fe) tablet Take 210 mg by mouth 3 (three) times  daily with meals.     Blood Glucose Monitoring Suppl (FREESTYLE LITE) w/Device KIT Inject 1 each into the skin daily. 1 kit 0   carvedilol  (COREG ) 12.5 MG tablet Take 12.5 mg by mouth 2 (two) times daily.     fluticasone  (FLOVENT  HFA) 110 MCG/ACT inhaler Inhale 1 puff into the lungs 2 (two) times daily.     Lancets (ONETOUCH ULTRASOFT) lancets Use as instructed to test blood sugar daily 100 each 5   levocetirizine (XYZAL ) 5 MG tablet Take 1 tablet (5 mg total) by mouth every evening. For allergies 90 tablet 3   losartan  (COZAAR ) 50 MG tablet Take 50 mg by mouth daily.     saxagliptin  HCl (ONGLYZA) 2.5 MG TABS tablet Take 1 tablet (2.5 mg total) by mouth daily. for diabetes. 90 tablet 1   SYNTHROID  150 MCG tablet TAKE 1 TABLET BY MOUTH DAILY BEFORE BREAKFAST. 90 tablet 1   traMADol  (ULTRAM ) 50 MG tablet Take 1 tablet (50 mg total) by mouth every 12 (twelve) hours as needed. 10 tablet 0   dolutegravir  ([name] ) 50 MG tablet  Take 1 tablet (50 mg total) by mouth daily. 30 tablet 11   emtricitabine -tenofovir  AF (DESCOVY ) 200-25 MG tablet Take 1 tablet by mouth daily. 30 tablet 11   metroNIDAZOLE  (METROGEL ) 0.75 % gel Apply 1 applicatorful PV at bedtime x 5 days (Patient not taking: Reported on 03/13/2024) 45 g 0   rosuvastatin  (CRESTOR ) 10 MG tablet TAKE 1 TABLET BY MOUTH EVERY DAY FOR CHOLESTEROL (Patient not taking: No sig reported) 90 tablet 0   No facility-administered medications prior to visit.     Allergies  Allergen Reactions   Genvoya [Elviteg-Cobic-Emtricit-Tenofaf] Hives   Lisinopril Cough   Aldactone  [Spironolactone ] Hives   Tegaderm Ag Mesh [Silver] Itching    Social History   Tobacco Use   Smoking status: Former    Current packs/day: 0.00    Average packs/day: 0.5 packs/day for 15.0 years (7.5 ttl pk-yrs)    Types: Cigarettes    Start date: 04/12/2017    Quit date: 2019    Years since quitting: 6.4   Smokeless tobacco: Never  Vaping Use   Vaping status: Never Used  Substance Use Topics   Alcohol  use: Not Currently    Comment: occasional wine   Drug use: No    Social History   Substance and Sexual Activity  Sexual Activity Not Currently   Birth control/protection: Post-menopausal     Objective:   Vitals:   03/13/24 1412  BP: 100/70  Pulse: 83  Temp: 97.7 F (36.5 C)  TempSrc: Oral  SpO2: 98%  Weight: 169 lb (76.7 kg)  Height: 5\' 5"  (1.651 m)   Body mass index is 28.12 kg/m.  Physical Exam Constitutional:      Appearance: Normal appearance. She is not ill-appearing.  HENT:     Mouth/Throat:     Mouth: Mucous membranes are moist.     Pharynx: Oropharynx is clear.  Eyes:     General: No scleral icterus. Cardiovascular:     Rate and Rhythm: Normal rate.  Pulmonary:     Effort: Pulmonary effort is normal.  Neurological:     Mental Status: She is oriented to person, place, and time.  Psychiatric:        Mood and Affect: Mood normal.        Thought Content:  Thought content normal.     Lab Results Lab Results  Component Value Date   WBC 7.1 09/15/2023  HGB 12.6 01/10/2024   HCT 37.0 01/10/2024   MCV 96.0 09/15/2023   PLT 284 09/15/2023    Lab Results  Component Value Date   CREATININE 6.00 (H) 01/10/2024   BUN 40 (H) 01/10/2024   NA 135 01/10/2024   K 4.6 01/10/2024   CL 100 01/10/2024   CO2 31 09/15/2023    Lab Results  Component Value Date   ALT <5 08/28/2023   AST 19 08/28/2023   ALKPHOS 86 08/28/2023   BILITOT 0.7 08/28/2023    Lab Results  Component Value Date   CHOL 113 01/11/2024   HDL 44.00 01/11/2024   LDLCALC 59 01/11/2024   LDLDIRECT 171.0 06/05/2019   TRIG 53.0 01/11/2024   CHOLHDL 3 01/11/2024   HIV 1 RNA Quant (Copies/mL)  Date Value  06/08/2023 <20 (H)  12/28/2021 Not Detected  06/15/2021 <20 (H)   CD4 T Cell Abs (/uL)  Date Value  06/08/2023 635  03/17/2021 626  08/18/2020 624     Assessment & Plan:  Assessment and Plan    HIV infection - Well controlled VL < 20 and CD4 > 500 -   HIV infection with undetectable viral load. Occasional missed doses due to insurance issues with medication refills. Interested in switching to Dovato, a single-tablet regimen, to improve adherence and reduce missed doses. D/W pharmacy team and fine to proceed in the setting of peritoneal dialysis - just monitor cell lines for any side effects in blood counts.  - Switch to Dovato. - Provide sample of Dovato x 2 weeks today to help buffer between fills.  - Schedule lab work in 1-2 months to monitor viral load and CD4 count.  End-stage renal disease on peritoneal dialysis - End-stage renal disease managed with home peritoneal dialysis. Improved control and comfort compared to previous hemodialysis. No issues with current peritoneal dialysis port, replaced in March after initial failure.  Hidradenitis suppurativa - Chronic hidradenitis suppurativa affecting axillary region and right breast. Minocycline  used  intermittently with good symptom control and no outbreak in some time. With ESRD and other medications she is not a rifampin regimen candidate and limits some of the other possible helpful medications. Discussed risks associated with minocycline  including potential bacterial resistance and skin pigmentation changes. Open to dermatology referral for further management options. I am not sure what the risk is with some of the biologic agents out there in consideration of her peritoneal dialysis port and minocycline  may be a better option for intermittent use to help manage risks with other medications. Would like her to have a visit with dermatology and get established with them to help with another opinion and look at options for her. Will refill minocycline  for her given good response.  - Send referral to dermatology. - Refill minocycline .  General Health Maintenance Due for shingles vaccination. - Can get at local pharmacy to ensure insurance coverage - needs 2 doses.   Recording duration: 33 minutes      Meds ordered this encounter  Medications   dolutegravir -lamiVUDine (DOVATO) 50-300 MG tablet    Sig: Take 1 tablet by mouth daily.    Dispense:  30 tablet    Refill:  11    Prescription Type::   Renewal   minocycline  (MINOCIN ) 100 MG capsule    Sig: Take 1 capsule (100 mg total) by mouth 2 (two) times daily.    Dispense:  60 capsule    Refill:  5   Orders Placed This Encounter  Procedures   COMPLETE METABOLIC  PANEL WITHOUT GFR    Standing Status:   Future    Expiration Date:   03/13/2025   HIV 1 RNA quant-no reflex-bld    Standing Status:   Future    Expiration Date:   03/13/2025   T-helper cells (CD4) count    Standing Status:   Future    Expiration Date:   03/13/2025   Ambulatory referral to Dermatology    Referral Priority:   Routine    Referral Type:   Consultation    Referral Reason:   Specialty Services Required    Requested Specialty:   Dermatology    Number of Visits  Requested:   1   Return in about 2 months (around 05/13/2024).  Gibson Kurtz, MSN, NP-C [facility] Pager: [phone] Office: [phone]  03/13/24  4:37 PM

## 2024-03-14 ENCOUNTER — Ambulatory Visit: Payer: Medicare HMO | Admitting: Podiatry

## 2024-03-14 DIAGNOSIS — N2581 Secondary hyperparathyroidism of renal origin: Secondary | ICD-10-CM | POA: Diagnosis not present

## 2024-03-14 DIAGNOSIS — Z992 Dependence on renal dialysis: Secondary | ICD-10-CM | POA: Diagnosis not present

## 2024-03-14 DIAGNOSIS — N186 End stage renal disease: Secondary | ICD-10-CM | POA: Diagnosis not present

## 2024-03-15 ENCOUNTER — Other Ambulatory Visit: Payer: Self-pay | Admitting: Pharmacist

## 2024-03-15 DIAGNOSIS — Z992 Dependence on renal dialysis: Secondary | ICD-10-CM | POA: Diagnosis not present

## 2024-03-15 DIAGNOSIS — Z21 Asymptomatic human immunodeficiency virus [HIV] infection status: Secondary | ICD-10-CM

## 2024-03-15 DIAGNOSIS — N2581 Secondary hyperparathyroidism of renal origin: Secondary | ICD-10-CM | POA: Diagnosis not present

## 2024-03-15 DIAGNOSIS — N186 End stage renal disease: Secondary | ICD-10-CM | POA: Diagnosis not present

## 2024-03-15 MED ORDER — DOVATO 50-300 MG PO TABS
1.0000 | ORAL_TABLET | Freq: Every day | ORAL | Status: AC
Start: 2024-03-13 — End: 2024-03-27

## 2024-03-15 NOTE — Progress Notes (Signed)
 Medication Samples have been provided to the patient.  Drug name: Dovato        Strength: 50/300 mg         Qty: 14  Tablets (1 bottles) LOT: [id number]   Exp.Date: 7/26  Samples requested by Gibson Kurtz, NP.  Dosing instructions: Take one tablet by mouth once daily  The patient has been instructed regarding the correct time, dose, and frequency of taking this medication, including desired effects and most common side effects.   Nicklas Barns, PharmD, CPP, BCIDP, AAHIVP Clinical Pharmacist Practitioner Infectious Diseases Clinical Pharmacist [facility] Infect[facility]

## 2024-03-16 DIAGNOSIS — N2581 Secondary hyperparathyroidism of renal origin: Secondary | ICD-10-CM | POA: Diagnosis not present

## 2024-03-16 DIAGNOSIS — N186 End stage renal disease: Secondary | ICD-10-CM | POA: Diagnosis not present

## 2024-03-16 DIAGNOSIS — Z992 Dependence on renal dialysis: Secondary | ICD-10-CM | POA: Diagnosis not present

## 2024-03-16 DIAGNOSIS — E1122 Type 2 diabetes mellitus with diabetic chronic kidney disease: Secondary | ICD-10-CM | POA: Diagnosis not present

## 2024-03-17 DIAGNOSIS — N2581 Secondary hyperparathyroidism of renal origin: Secondary | ICD-10-CM | POA: Diagnosis not present

## 2024-03-17 DIAGNOSIS — Z992 Dependence on renal dialysis: Secondary | ICD-10-CM | POA: Diagnosis not present

## 2024-03-17 DIAGNOSIS — N186 End stage renal disease: Secondary | ICD-10-CM | POA: Diagnosis not present

## 2024-03-18 DIAGNOSIS — N2581 Secondary hyperparathyroidism of renal origin: Secondary | ICD-10-CM | POA: Diagnosis not present

## 2024-03-18 DIAGNOSIS — Z992 Dependence on renal dialysis: Secondary | ICD-10-CM | POA: Diagnosis not present

## 2024-03-18 DIAGNOSIS — N186 End stage renal disease: Secondary | ICD-10-CM | POA: Diagnosis not present

## 2024-03-19 DIAGNOSIS — N186 End stage renal disease: Secondary | ICD-10-CM | POA: Diagnosis not present

## 2024-03-19 DIAGNOSIS — Z992 Dependence on renal dialysis: Secondary | ICD-10-CM | POA: Diagnosis not present

## 2024-03-19 DIAGNOSIS — N2581 Secondary hyperparathyroidism of renal origin: Secondary | ICD-10-CM | POA: Diagnosis not present

## 2024-03-20 ENCOUNTER — Other Ambulatory Visit: Payer: Self-pay

## 2024-03-20 ENCOUNTER — Emergency Department (HOSPITAL_BASED_OUTPATIENT_CLINIC_OR_DEPARTMENT_OTHER)
Admission: EM | Admit: 2024-03-20 | Discharge: 2024-03-20 | Disposition: A | Discharge status: Home / Self Care | Attending: Emergency Medicine | Admitting: Emergency Medicine

## 2024-03-20 ENCOUNTER — Encounter (HOSPITAL_BASED_OUTPATIENT_CLINIC_OR_DEPARTMENT_OTHER): Payer: Self-pay

## 2024-03-20 ENCOUNTER — Emergency Department (HOSPITAL_BASED_OUTPATIENT_CLINIC_OR_DEPARTMENT_OTHER)

## 2024-03-20 DIAGNOSIS — I10 Essential (primary) hypertension: Secondary | ICD-10-CM | POA: Diagnosis not present

## 2024-03-20 DIAGNOSIS — N186 End stage renal disease: Secondary | ICD-10-CM | POA: Insufficient documentation

## 2024-03-20 DIAGNOSIS — I959 Hypotension, unspecified: Secondary | ICD-10-CM | POA: Insufficient documentation

## 2024-03-20 DIAGNOSIS — M545 Low back pain, unspecified: Secondary | ICD-10-CM | POA: Insufficient documentation

## 2024-03-20 DIAGNOSIS — N200 Calculus of kidney: Secondary | ICD-10-CM | POA: Diagnosis not present

## 2024-03-20 DIAGNOSIS — Z992 Dependence on renal dialysis: Secondary | ICD-10-CM | POA: Diagnosis not present

## 2024-03-20 DIAGNOSIS — N2581 Secondary hyperparathyroidism of renal origin: Secondary | ICD-10-CM | POA: Diagnosis not present

## 2024-03-20 DIAGNOSIS — Z79899 Other long term (current) drug therapy: Secondary | ICD-10-CM | POA: Diagnosis not present

## 2024-03-20 DIAGNOSIS — Z7982 Long term (current) use of aspirin: Secondary | ICD-10-CM | POA: Insufficient documentation

## 2024-03-20 DIAGNOSIS — M549 Dorsalgia, unspecified: Secondary | ICD-10-CM | POA: Diagnosis not present

## 2024-03-20 DIAGNOSIS — I701 Atherosclerosis of renal artery: Secondary | ICD-10-CM | POA: Diagnosis not present

## 2024-03-20 DIAGNOSIS — I7123 Aneurysm of the descending thoracic aorta, without rupture: Secondary | ICD-10-CM | POA: Diagnosis not present

## 2024-03-20 LAB — CBC WITH DIFFERENTIAL/PLATELET
Abs Immature Granulocytes: 0.7 10*3/uL — ABNORMAL HIGH (ref 0.00–0.07)
Basophils Absolute: 0.1 10*3/uL (ref 0.0–0.1)
Basophils Relative: 1 %
Eosinophils Absolute: 0 10*3/uL (ref 0.0–0.5)
Eosinophils Relative: 0 %
HCT: 33.4 % — ABNORMAL LOW (ref 36.0–46.0)
Hemoglobin: 11 g/dL — ABNORMAL LOW (ref 12.0–15.0)
Immature Granulocytes: 5 %
Lymphocytes Relative: 10 %
Lymphs Abs: 1.4 10*3/uL (ref 0.7–4.0)
MCH: 33.8 pg (ref 26.0–34.0)
MCHC: 32.9 g/dL (ref 30.0–36.0)
MCV: 102.8 fL — ABNORMAL HIGH (ref 80.0–100.0)
Monocytes Absolute: 1.4 10*3/uL — ABNORMAL HIGH (ref 0.1–1.0)
Monocytes Relative: 11 %
Neutro Abs: 9.8 10*3/uL — ABNORMAL HIGH (ref 1.7–7.7)
Neutrophils Relative %: 73 %
Platelets: 223 10*3/uL (ref 150–400)
RBC: 3.25 MIL/uL — ABNORMAL LOW (ref 3.87–5.11)
RDW: 16.2 % — ABNORMAL HIGH (ref 11.5–15.5)
WBC: 13.3 10*3/uL — ABNORMAL HIGH (ref 4.0–10.5)
nRBC: 0 % (ref 0.0–0.2)

## 2024-03-20 LAB — COMPREHENSIVE METABOLIC PANEL WITH GFR
ALT: 22 U/L (ref 0–44)
AST: 16 U/L (ref 15–41)
Albumin: 2.9 g/dL — ABNORMAL LOW (ref 3.5–5.0)
Alkaline Phosphatase: 153 U/L — ABNORMAL HIGH (ref 38–126)
Anion gap: 21 — ABNORMAL HIGH (ref 5–15)
BUN: 75 mg/dL — ABNORMAL HIGH (ref 6–20)
CO2: 23 mmol/L (ref 22–32)
Calcium: 8.9 mg/dL (ref 8.9–10.3)
Chloride: 90 mmol/L — ABNORMAL LOW (ref 98–111)
Creatinine, Ser: 12.7 mg/dL — ABNORMAL HIGH (ref 0.44–1.00)
GFR, Estimated: 3 mL/min — ABNORMAL LOW (ref >60)
Glucose, Bld: 136 mg/dL — ABNORMAL HIGH (ref 70–99)
Potassium: 3.2 mmol/L — ABNORMAL LOW (ref 3.5–5.1)
Sodium: 134 mmol/L — ABNORMAL LOW (ref 135–145)
Total Bilirubin: 0.3 mg/dL (ref 0.0–1.2)
Total Protein: 7.3 g/dL (ref 6.5–8.1)

## 2024-03-20 LAB — LACTIC ACID, PLASMA: Lactic Acid, Venous: 1.2 mmol/L (ref 0.5–1.9)

## 2024-03-20 MED ORDER — TRAMADOL HCL 50 MG PO TABS: 50.0000 mg | ORAL_TABLET | Freq: Two times a day (BID) | ORAL | 0 refills | Status: DC | PRN

## 2024-03-20 MED ORDER — SODIUM CHLORIDE 0.9 % IV BOLUS
500.0000 mL | Freq: Once | INTRAVENOUS | Status: AC
  Administered 2024-03-20: 500 mL via INTRAVENOUS

## 2024-03-20 MED ORDER — LIDOCAINE 5 % EX PTCH
1.0000 | MEDICATED_PATCH | CUTANEOUS | Status: DC
  Administered 2024-03-20: 1 via TRANSDERMAL
  Filled 2024-03-20: qty 1

## 2024-03-20 MED ORDER — FENTANYL CITRATE PF 50 MCG/ML IJ SOSY
25.0000 ug | PREFILLED_SYRINGE | INTRAMUSCULAR | Status: AC
  Administered 2024-03-20: 25 ug via INTRAVENOUS
  Filled 2024-03-20: qty 1

## 2024-03-20 MED ORDER — IOHEXOL 350 MG/ML SOLN
100.0000 mL | Freq: Once | INTRAVENOUS | Status: AC | PRN
  Administered 2024-03-20: 100 mL via INTRAVENOUS

## 2024-03-20 NOTE — ED Notes (Signed)
 Pt ambulated with no concerns

## 2024-03-20 NOTE — Discharge Instructions (Addendum)
 You were seen for your back pain in the emergency department.   At home, please use over-the-counter Tylenol , tramadol , and lidocaine  patches.   You may continue your Coreg  but hold off on taking your losartan  and amlodipine  until your blood pressure increases to above 140 mmHg systolic.  Follow-up with your primary doctor in 2-3 days regarding your visit.  Follow-up with the spine clinic as soon as possible regarding your symptoms.  Follow-up with your vascular doctor as well about the possible fistula forming.  Return immediately to the emergency department if you experience any of the following: Numbness or weakness of your legs, bowel or bladder incontinence, numbness while wiping after pooping or urinating, or any other concerning symptoms.    Thank you for visiting our Emergency Department. It was a pleasure taking care of you today.

## 2024-03-20 NOTE — ED Triage Notes (Signed)
 Pt via pov from home with back pain x 1 week. Pt also reports sob x 2 days. Pt had open heart surgery related to dissection in November 2024. Pt's back pain is lower left. Denies injury. Pt has hx of kidney stones, states this is different. Pt alert & oriented, nad noted.

## 2024-03-20 NOTE — ED Provider Notes (Signed)
 [facility] AT [facility] Provider Note   CSN: [id number] Arrival date & time: 03/20/24  1603     History  Chief Complaint  Patient presents with   Back Pain    [name] is a 58 y.o. female.  58 year old female with a history of aortic dissection status postrepair, kidney stones, and ESRD on dialysis who presents emergency department with back pain.  Week ago patient started having atraumatic left flank and back pain.  Describes it intermittent waves of warmth.  Worsen when she moves.  No known injuries.  Says it is getting to the point where it is making it difficult to walk due to the pain so she decided to come into the emergency department today.  No bowel or bladder incontinence.  No leg numbness.  Not on blood thinners.  Came from nephrology office and they did draw fluid and states that she does not have peritonitis.       Home Medications Prior to Admission medications   Medication Sig Start Date End Date Taking? Authorizing Provider  acetaminophen  (TYLENOL ) 500 MG tablet Take 1,000 mg by mouth every 6 (six) hours as needed for mild pain.    [provider]  albuterol  (VENTOLIN  HFA) 108 (90 Base) MCG/ACT inhaler INHALE 2 PUFFS INTO THE LUNGS UP TO EVERY 6HRS AS NEEDED FOR WHEEZING/SHORTNESS OF BREATH 09/06/23   Gabriel John, NP  allopurinol  (ZYLOPRIM ) 100 MG tablet Take 1 tablet (100 mg total) by mouth daily. For gout prevention 01/10/24   Clark, Katherine K, NP  amLODipine  (NORVASC ) 10 MG tablet Take 10 mg by mouth daily. 05/29/23   [provider]  aspirin  EC 81 MG tablet Take 81 mg by mouth daily.    [provider]  AURYXIA  1 GM 210 MG(Fe) tablet Take 210 mg by mouth 3 (three) times daily with meals. 04/05/23   [provider]  Blood Glucose Monitoring Suppl (FREESTYLE LITE) w/Device KIT Inject 1 each into the skin daily. 11/10/20   Maudine Sos, MD  carvedilol  (COREG ) 12.5 MG tablet Take  12.5 mg by mouth 2 (two) times daily. 10/20/23   [provider]  dolutegravir -lamiVUDine (DOVATO ) 50-300 MG tablet Take 1 tablet by mouth daily. 03/13/24   Orson Blalock, NP  dolutegravir -lamiVUDine (DOVATO ) 50-300 MG tablet Take 1 tablet by mouth daily for 14 days. 03/13/24 03/27/24  Sonya Duster, RPH-CPP  fluticasone  (FLOVENT  HFA) 110 MCG/ACT inhaler Inhale 1 puff into the lungs 2 (two) times daily.    [provider]  Lancets Emmett Harman ULTRASOFT) lancets Use as instructed to test blood sugar daily 02/15/24   Clark, Katherine K, NP  levocetirizine (XYZAL ) 5 MG tablet Take 1 tablet (5 mg total) by mouth every evening. For allergies 04/27/23   Clark, Katherine K, NP  losartan  (COZAAR ) 50 MG tablet Take 50 mg by mouth daily.    [provider]  minocycline  (MINOCIN ) 100 MG capsule Take 1 capsule (100 mg total) by mouth 2 (two) times daily. 03/13/24   Orson Blalock, NP  saxagliptin  HCl (ONGLYZA) 2.5 MG TABS tablet Take 1 tablet (2.5 mg total) by mouth daily. for diabetes. 01/11/24   Clark, Katherine K, NP  SYNTHROID  150 MCG tablet TAKE 1 TABLET BY MOUTH DAILY BEFORE BREAKFAST. 03/12/24   Emilie Harden, MD  traMADol  (ULTRAM ) 50 MG tablet Take 1 tablet (50 mg total) by mouth every 12 (twelve) hours as needed. 03/20/24   Ninetta Basket, MD  Allergies    Genvoya [elviteg-cobic-emtricit-tenofaf], Lisinopril, Aldactone  [spironolactone ], and Tegaderm ag mesh [silver]    Review of Systems   Review of Systems  Physical Exam Updated Vital Signs BP 98/61   Pulse 82   Temp 98.3 F (36.8 C)   Resp 19   Ht 5\' 5"  (1.651 m)   Wt 78 kg   LMP 03/31/2014 (LMP Unknown)   SpO2 100%   BMI 28.62 kg/m  Physical Exam Vitals and nursing note reviewed.  Constitutional:      General: She is not in acute distress.    Appearance: She is well-developed.     Comments: Laying flat.  Uncomfortable appearing.  HENT:     Head: Normocephalic and atraumatic.     Right Ear:  External ear normal.     Left Ear: External ear normal.     Nose: Nose normal.  Eyes:     Extraocular Movements: Extraocular movements intact.     Conjunctiva/sclera: Conjunctivae normal.     Pupils: Pupils are equal, round, and reactive to light.  Cardiovascular:     Rate and Rhythm: Normal rate and regular rhythm.     Heart sounds: No murmur heard.    Comments: Radial pulses 2+ bilaterally.  DP pulses 2+ bilaterally. Pulmonary:     Effort: Pulmonary effort is normal. No respiratory distress.     Breath sounds: Normal breath sounds.  Abdominal:     General: Abdomen is flat. There is no distension.     Palpations: Abdomen is soft. There is no mass.     Tenderness: There is no abdominal tenderness. There is no guarding.     Comments: PD catheter in place  Musculoskeletal:     Cervical back: Normal range of motion and neck supple.     Right lower leg: No edema.     Left lower leg: No edema.     Comments: No spinal midline TTP in cervical, thoracic spine. No stepoffs noted.  Tenderness palpation in lumbar spine at approximately L3.  Motor: Muscle bulk and tone are normal. Strength is 5/5 in hip flexion, knee flexion and extension, ankle dorsiflexion and plantar flexion bilaterally. Full strength of great toe dorsiflexion bilaterally.  Sensory: Intact sensation to light touch in L2 though S1 dermatomes bilaterally.   Reflexes: Patellar 2+ bilaterally, Achilles 2+ bilaterally, no ankle clonus bilaterally  Skin:    General: Skin is warm and dry.  Neurological:     Mental Status: She is alert and oriented to person, place, and time. Mental status is at baseline.  Psychiatric:        Mood and Affect: Mood normal.     ED Results / Procedures / Treatments   Labs (all labs ordered are listed, but only abnormal results are displayed) Labs Reviewed  COMPREHENSIVE METABOLIC PANEL WITH GFR - Abnormal; Notable for the following components:      Result Value   Sodium 134 (*)    Potassium  3.2 (*)    Chloride 90 (*)    Glucose, Bld 136 (*)    BUN 75 (*)    Creatinine, Ser 12.70 (*)    Albumin  2.9 (*)    Alkaline Phosphatase 153 (*)    GFR, Estimated 3 (*)    Anion gap 21 (*)    All other components within normal limits  CBC WITH DIFFERENTIAL/PLATELET - Abnormal; Notable for the following components:   WBC 13.3 (*)    RBC 3.25 (*)    Hemoglobin 11.0 (*)  HCT 33.4 (*)    MCV 102.8 (*)    RDW 16.2 (*)    Neutro Abs 9.8 (*)    Monocytes Absolute 1.4 (*)    Abs Immature Granulocytes 0.70 (*)    All other components within normal limits  LACTIC ACID, PLASMA  URINALYSIS, ROUTINE W REFLEX MICROSCOPIC    EKG EKG Interpretation Date/Time:  Wednesday March 20 2024 16:13:28 EDT Ventricular Rate:  94 PR Interval:  156 QRS Duration:  154 QT Interval:  448 QTC Calculation: 560 R Axis:   -64  Text Interpretation: Normal sinus rhythm Possible Left atrial enlargement Left axis deviation Left bundle branch block Abnormal ECG When compared with ECG of 15-Sep-2023 17:35, PREVIOUS ECG IS PRESENT Confirmed by Shyrl Doyne [phone]) on 03/20/2024 5:01:20 PM  Radiology CT ANGIO CHEST/ABD/PEL FOR DISSECTION W &/OR WO CONTRAST Result Date: 03/20/2024 CLINICAL DATA:  History of dissection. Back pain for 1 week. Shortness of breath for 2 days. Previous open heart surgery for dissection in November 2024. EXAM: CT ANGIOGRAPHY CHEST, ABDOMEN AND PELVIS TECHNIQUE: Non-contrast CT of the chest was initially obtained. Multidetector CT imaging through the chest, abdomen and pelvis was performed using the standard protocol during bolus administration of intravenous contrast. Multiplanar reconstructed images and MIPs were obtained and reviewed to evaluate the vascular anatomy. RADIATION DOSE REDUCTION: This exam was performed according to the departmental dose-optimization program which includes automated exposure control, adjustment of the mA and/or kV according to patient size and/or use of  iterative reconstruction technique. CONTRAST:  OMNIPAQUE  IOHEXOL  350 MG/ML SOLN COMPARISON:  09/15/2023 FINDINGS: CTA CHEST FINDINGS Cardiovascular: Unenhanced images of the chest demonstrate ascending and descending thoracic aortic stent graft. No acute intramural hematoma is identified. Images obtained during the arterial phase after intravenous contrast material administration demonstrate patent thoracic aorta. No recurrent thoracic aortic dissection. Stent graft appears patent. Aneurysm of the descending aorta with maximal diameter including the stent graft and the native sac of 5.2 cm. No change since prior study. No evidence of endoleak during the arterial phase. Great vessel origins are patent. Normal heart size. No pericardial effusions. Central pulmonary arteries are well opacified without evidence of significant pulmonary embolus. Mediastinum/Nodes: No enlarged mediastinal, hilar, or axillary lymph nodes. Thyroid  gland, trachea, and esophagus demonstrate no significant findings. Lungs/Pleura: Scattered linear scarring in both lung bases with mild subpleural fibrosis on the right. No airspace disease or consolidation. No pleural effusion or pneumothorax. Musculoskeletal: Sternotomy wires.  No acute bony abnormalities. Review of the MIP images confirms the above findings. CTA ABDOMEN AND PELVIS FINDINGS VASCULAR Aorta: The stent graft in that the level of the abdominal head 8 S. there is persistent dissection involving the abdominal aorta with slow flow demonstrated in the false lumen. Dissection extends throughout the abdominal aorta and into the left iliac, external iliac, and common femoral arteries. No aneurysm. No significant change since prior study. Calcification of the aorta. Celiac: Arises from the true lumen and appears patent. SMA: Arises from the true lumen and appears patent. Renals: Bilateral renal arteries arise from the true lumen. There is evidence of moderate stenosis of the right and  left central renal arteries with about 50% diameter reduction. Nephrograms are homogeneous but there is mild parenchymal scarring, greater on the right. IMA: Arises from the false lumen and appears patent. Inflow: Right iliac, external iliac, and internal iliac arteries are patent, arising from the true lumen. Dissection extends into the left internal iliac artery, external iliac artery, and common femoral artery to the  level of the femoral bifurcation. The superficial femoral and deep femoral arteries appear to arise from the true lumen and are patent. The true lumen of the external iliac artery is patent but narrowed, representing about 50% diameter reduction. Slow flow within the false lumen of the common iliac artery but no flow demonstrated within the false lumen of the external iliac artery. Mild flow demonstrated in the false lumen of the common femoral artery. The internal iliac artery arises from the true lumen and appears patent. No significant change. Veins: There is early flow demonstrated in the left common iliac vein, not seen on the prior study. This could indicate a fistula. Review of the MIP images confirms the above findings. NON-VASCULAR Hepatobiliary: No focal liver lesions. Gallbladder is contracted. Probable small gallstones. No inflammatory stranding. No bile duct dilatation. Pancreas: Unremarkable. No pancreatic ductal dilatation or surrounding inflammatory changes. Spleen: Normal in size without focal abnormality. Adrenals/Urinary Tract: Diffusely enlarged left adrenal gland, suggesting hyperplasia. No change. Bilateral renal parenchymal scarring, greater on the right. Stone in the lower pole right kidney measuring 5 mm diameter. No hydronephrosis or hydroureter. Bladder is decompressed. Stomach/Bowel: Stomach, small bowel, and colon are not abnormally distended. No wall thickening or inflammatory changes. Appendix is not identified. Lymphatic: No significant lymphadenopathy. Reproductive:  Uterus and bilateral adnexa are unremarkable. Other: Pelvic catheter inserted via the anterior left upper quadrant abdominal wall. This likely represents a peritoneal dialysis catheter. Focal fluid collection around the catheter while in the subcutaneous tunnel, measuring 1.7 cm diameter. Nonspecific etiology. The catheter is new since prior study. No free air or free fluid in the abdomen. Musculoskeletal: No acute or significant osseous findings. Review of the MIP images confirms the above findings. IMPRESSION: 1. Thoracic aortic stent graft appears patent. 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 Native sac is thrombosed. No endoleak demonstrated. No residual recurrent thoracic aortic dissection seen. 2. Residual dissection in the abdominal aorta extending into the left iliac, external iliac, and common femoral arteries. This appears similar to prior study. 3. Early appearance of contrast material in the left common iliac veins is new since prior study and nonspecific but could indicate a fistula. 4. Mild to moderate stenosis of the renal arteries and external iliac artery on the left. 5. Peritoneal dialysis catheter is present. 6. Probable cholelithiasis. 7. Nonobstructing right renal stone. Electronically Signed   By: Boyce Byes M.D.   On: 03/20/2024 17:26    Procedures Procedures   Ultrasound Guided Peripheral IV Indication: difficult to access - nursing staff unable to secure adequate peripheral IV access Location: L Antecubital Catheter Size: 20 G  Static views used to identify the target vein then usual prep with Chloraprep. IV placed on first attempt under dynamic US  guidance. Dark red flash noted, and catheter advanced smoothly into the vein. NS saline flushed without resistance, witnessed swelling, or patient discomfort. IV secured with I-site and tegaderm. The patient tolerated the procedure well. Performed By: Shyrl Doyne MD  Medications Ordered  in ED Medications  lidocaine  (LIDODERM ) 5 % 1 patch (1 patch Transdermal Patch Applied 03/20/24 1706)  sodium chloride  0.9 % bolus 500 mL (0 mLs Intravenous Stopped 03/20/24 1820)  fentaNYL  (SUBLIMAZE ) injection 25 mcg (25 mcg Intravenous Given 03/20/24 1708)  iohexol  (OMNIPAQUE ) 350 MG/ML injection 100 mL (100 mLs Intravenous Contrast Given 03/20/24 1651)  sodium chloride  0.9 % bolus 500 mL (0 mLs Intravenous Stopped 03/20/24 1931)    ED Course/ Medical Decision Making/ A&P Clinical Course  as of 03/20/24 2315  Wed Mar 20, 2024  1704 Dr Fulton Job from vascular has reviewed imaging. Does have infrarenal dissection that appears unchanged from prior.  Does not feel that any vascular intervention is needed and does not feel that her dissection is related to her symptoms today. [RP]    Clinical Course User Index [RP] Ninetta Basket, MD                                 Medical Decision Making Amount and/or Complexity of Data Reviewed Labs: ordered. Radiology: ordered.  Risk Prescription drug management.   58 year old female with a history of aortic dissection status postrepair, kidney stones, and ESRD on dialysis who presents emergency department with back pain.   Initial Ddx:  Aortic dissection, rupture, muscle strain, spinal cord compression, pyelonephritis, SBP  MDM/Course:  Patient presents emergency department with left-sided flank pain.  Is atraumatic.  Is been going on for several days.  On arrival was hypotensive.  Blood pressure improved with fluids.  Does have some left-sided flank pain that is tender to palpation.  No neurologic deficits in her legs.  No signs of spinal cord compression.  Underwent an emergent CTA to reevaluate her dissection but it did not show evidence of dissection.  Did show possible fistula forming on the left common iliac.  Not having any pain in her leg and has good pulses distally.  Images reviewed by vascular surgery and felt that she can follow-up as an  outpatient with them if the remainder of her workup was normal.  Lactic acid was within normal limits as well.  Upon re-evaluation was feeling much better after pain medication was able to ambulate without difficulty.  However follow-up with her primary doctor and vascular surgery as well as spine surgery for symptoms.  Due to her soft blood pressures we will have her hold her blood pressure medication at home  This patient presents to the ED for concern of complaints listed in HPI, this involves an extensive number of treatment options, and is a complaint that carries with it a high risk of complications and morbidity. Disposition including potential need for admission considered.   Dispo: DC Home. Return precautions discussed including, but not limited to, those listed in the AVS. Allowed pt time to ask questions which were answered fully prior to dc.  Additional history obtained from daughter Records reviewed Outpatient Clinic Notes The following labs were independently interpreted: Chemistry and show CKD I independently reviewed the following imaging with scope of interpretation limited to determining acute life threatening conditions related to emergency care: CT Chest and CT Abdomen/Pelvis and agree with the radiologist interpretation with the following exceptions: none I personally reviewed and interpreted cardiac monitoring: normal sinus rhythm  I personally reviewed and interpreted the pt's EKG: see above for interpretation  I have reviewed the patients home medications and made adjustments as needed Consults: Vascular Surgery  Portions of this note were generated with Dragon dictation software. Dictation errors may occur despite best attempts at proofreading.     Final Clinical Impression(s) / ED Diagnoses Final diagnoses:  Acute left-sided low back pain, unspecified whether sciatica present  Hypotension, unspecified hypotension type    Rx / DC Orders ED Discharge Orders           Ordered    traMADol  (ULTRAM ) 50 MG tablet  Every 12 hours PRN,   Status:  Discontinued  03/20/24 2017    traMADol  (ULTRAM ) 50 MG tablet  Every 12 hours PRN        03/20/24 2055              Ninetta Basket, MD 03/20/24 2315

## 2024-03-20 NOTE — ED Notes (Signed)
 Pt was seen at kidney clinic in [place] and had lab work drawn and checked for peritonitis. Peritonitis negative.

## 2024-03-21 ENCOUNTER — Other Ambulatory Visit: Payer: Self-pay | Admitting: Primary Care

## 2024-03-21 ENCOUNTER — Telehealth: Payer: Self-pay

## 2024-03-21 ENCOUNTER — Ambulatory Visit: Payer: Self-pay | Admitting: *Deleted

## 2024-03-21 ENCOUNTER — Other Ambulatory Visit (HOSPITAL_COMMUNITY): Payer: Self-pay

## 2024-03-21 ENCOUNTER — Ambulatory Visit: Payer: Self-pay

## 2024-03-21 DIAGNOSIS — E785 Hyperlipidemia, unspecified: Secondary | ICD-10-CM

## 2024-03-21 DIAGNOSIS — Z992 Dependence on renal dialysis: Secondary | ICD-10-CM | POA: Diagnosis not present

## 2024-03-21 DIAGNOSIS — M545 Other chronic pain: Secondary | ICD-10-CM

## 2024-03-21 DIAGNOSIS — N186 End stage renal disease: Secondary | ICD-10-CM | POA: Diagnosis not present

## 2024-03-21 DIAGNOSIS — N2581 Secondary hyperparathyroidism of renal origin: Secondary | ICD-10-CM | POA: Diagnosis not present

## 2024-03-21 MED ORDER — LIDOCAINE 5 % EX PTCH: 1.0000 | MEDICATED_PATCH | CUTANEOUS | 0 refills | Status: DC

## 2024-03-21 MED ORDER — CYCLOBENZAPRINE HCL 10 MG PO TABS
10.0000 mg | ORAL_TABLET | Freq: Three times a day (TID) | ORAL | 0 refills | Status: DC | PRN
Start: 2024-03-21 — End: 2024-07-30

## 2024-03-21 NOTE — Telephone Encounter (Signed)
 Since she was seen in the ED yesterday, I will send prescriptions for the muscle relaxer and lidocaine  patches to her pharmacy.   I know it can be difficult for her to come to appointments so if she would like to be seen tomorrow that is fine, otherwise we can try the lidocaine  patches and muscle relaxers.  I reviewed her ED visit.

## 2024-03-21 NOTE — Telephone Encounter (Signed)
 Unable to reach patient. Left voicemail to return call to our office.

## 2024-03-21 NOTE — Telephone Encounter (Signed)
    FYI Only or Action Required?: Action required by provider  Patient was last seen in primary care on 02/26/2024 by Nche, Connye Delaine, NP. Called Nurse Triage reporting Back Pain. Symptoms began a week ago. Interventions attempted: OTC medications: Tylenol , Prescription medications: Tramadol , and Rest, hydration, or home remedies. Symptoms are: lower back pain radiating with numbness and weakness down both legs gradually worsening.  Triage Disposition: See Physician Within 24 Hours (overriding See HCP Within 4 Hours (Or PCP Triage))  Patient/caregiver understands and will follow disposition?: Yes                             Copied from CRM [id number]. Topic: Clinical - Red Word Triage >> Mar 21, 2024 12:08 PM Ethelle Herb L wrote: Red Word that prompted transfer to Nurse Triage: pt seen at ED yesterday. Pt unable to get medications, missing lidocaine  patches, pt states pharmacy did not receive rx.   Pt still in a lot of pain >> Mar 21, 2024 12:28 PM [place] J wrote: Patient is in extreme pain and the ER did not give her the medication they were supposed to send in. Patient also needs to schedule her hospital follow up. Reason for Disposition  [1] Pain radiates into the thigh or further down the leg AND [2] both legs  Answer Assessment - Initial Assessment Questions 1. ONSET: "When did the pain begin?"      A week ago on Tuesday.  2. LOCATION: "Where does it hurt?" (upper, mid or lower back)     Lower.  3. SEVERITY: "How bad is the pain?"  (e.g., Scale 1-10; mild, moderate, or severe)   - MILD (1-3): Doesn't interfere with normal activities.    - MODERATE (4-7): Interferes with normal activities or awakens from sleep.    - SEVERE (8-10): Excruciating pain, unable to do any normal activities.      7/10.  4. PATTERN: "Is the pain constant?" (e.g., yes, no; constant, intermittent)      Constant. Improves when lying flat, resting.  5. RADIATION: "Does the pain shoot  into your legs or somewhere else?"     Radiates down both legs.  6. CAUSE:  "What do you think is causing the back pain?"      Sciatica.  7. BACK OVERUSE:  "Any recent lifting of heavy objects, strenuous work or exercise?"     No.  8. MEDICINES: "What have you taken so far for the pain?" (e.g., nothing, acetaminophen , NSAIDS)     Tramadol , Tylenol . She states it helps a little.  9. NEUROLOGIC SYMPTOMS: "Do you have any weakness, numbness, or problems with bowel/bladder control?"     Numbness and weakness down both legs. Denies problems with bowel or bladder control.  10. OTHER SYMPTOMS: "Do you have any other symptoms?" (e.g., fever, abdomen pain, burning with urination, blood in urine)       No.  11. PREGNANCY: "Is there any chance you are pregnant?" "When was your last menstrual period?"       N/A.  Patient seen in ED and states she was told they were prescribing lidocaine  patches and a muscle relaxer but that it was not sent in. Patient requesting for provider to send in for her.  Protocols used: Back Pain-A-AH

## 2024-03-21 NOTE — Telephone Encounter (Signed)
 Called and reviewed [name] message with patient. She verbalized understanding and appreciated the message. She canceled appt for tomorrow and will update if needed.

## 2024-03-21 NOTE — Telephone Encounter (Signed)
                       FYI Only or Action Required?: FYI only for provider  Patient was last seen in primary care on 02/26/2024 by Nche, Connye Delaine, NP. Called Nurse Triage reporting Medication Problem. Symptoms began yesterday. Interventions attempted: Other: only received 1 prescription from ED ultram  and reports MD did not send other 2 prescriptions she was told she would get for lidocaine  patches and a muscle relaxer. Symptoms are: unchanged.  Triage Disposition: See PCP When Office is Open (Within 3 Days)  Patient/caregiver understands and will follow disposition?: no . Patient wants ED Dr to send prescriptions to her pharmacy [facility] on [address]. NT unable to contact charge nurse at [facility] ED to notify of request from last ED visit 03/20/24. Patient requesting a call back.        Copied from CRM [id number]. Topic: Clinical - Red Word Triage >> Mar 21, 2024 12:08 PM Ethelle Herb L wrote: Red Word that prompted transfer to Nurse Triage: pt seen at ED yesterday. Pt unable to get medications, missing lidocaine  patches, pt states pharmacy did not receive rx.   Pt still in a lot of pain Reason for Disposition  Prescription request for new medicine (not a refill)  Answer Assessment - Initial Assessment Questions 1. NAME of MEDICINE: "What medicine(s) are you calling about?"     Lidocaine  patches and muscle relaxer 2. QUESTION: "What is your question?" (e.g., double dose of medicine, side effect)     Why the ED MD did not sent medication prescriptions to pharmacy after ED visit last night? Patient reports she was told her was sending ultram  , lidocaine  patches and a muscle relaxer to take until OV with PCP. Patient only received ultram  prescription 3. PRESCRIBER: "Who prescribed the medicine?" Reason: if prescribed by specialist, call should be referred to that group.     Dr. Shyrl Doyne- [facility] ED 4. SYMPTOMS: "Do you have any symptoms?" If Yes, ask: "What symptoms are you having?"   "How bad are the symptoms (e.g., mild, moderate, severe)     Continued pain in back  5. PREGNANCY:  "Is there any chance that you are pregnant?" "When was your last menstrual period?"     Na  Patient requesting  ED- MD to be contacted to send Rx due to she was told he was sending her home and prescriptions would be sent to pharmacy . NT attempted to call charge nurse at [facility] ED and rang extended time with no answer. Please advise if patient can be seen by PCP.  Protocols used: Medication Question Call-A-AH

## 2024-03-21 NOTE — Telephone Encounter (Signed)
 Pharmacy Patient Advocate Encounter   Received notification from CoverMyMeds that prior authorization for  Lidocaine  5% patches is required/requested.   Insurance verification completed.   The patient is insured through [employer] .   Per test claim: PA required; PA submitted to above mentioned insurance via CoverMyMeds Key/confirmation #/EOC [id number] Status is pending

## 2024-03-21 NOTE — Telephone Encounter (Signed)
 See other phone note

## 2024-03-22 ENCOUNTER — Ambulatory Visit: Admitting: Primary Care

## 2024-03-22 DIAGNOSIS — N186 End stage renal disease: Secondary | ICD-10-CM | POA: Diagnosis not present

## 2024-03-22 DIAGNOSIS — N2581 Secondary hyperparathyroidism of renal origin: Secondary | ICD-10-CM | POA: Diagnosis not present

## 2024-03-22 DIAGNOSIS — Z992 Dependence on renal dialysis: Secondary | ICD-10-CM | POA: Diagnosis not present

## 2024-03-23 DIAGNOSIS — Z992 Dependence on renal dialysis: Secondary | ICD-10-CM | POA: Diagnosis not present

## 2024-03-23 DIAGNOSIS — N2581 Secondary hyperparathyroidism of renal origin: Secondary | ICD-10-CM | POA: Diagnosis not present

## 2024-03-23 DIAGNOSIS — N186 End stage renal disease: Secondary | ICD-10-CM | POA: Diagnosis not present

## 2024-03-24 DIAGNOSIS — N186 End stage renal disease: Secondary | ICD-10-CM | POA: Diagnosis not present

## 2024-03-24 DIAGNOSIS — Z992 Dependence on renal dialysis: Secondary | ICD-10-CM | POA: Diagnosis not present

## 2024-03-24 DIAGNOSIS — N2581 Secondary hyperparathyroidism of renal origin: Secondary | ICD-10-CM | POA: Diagnosis not present

## 2024-03-25 DIAGNOSIS — N2581 Secondary hyperparathyroidism of renal origin: Secondary | ICD-10-CM | POA: Diagnosis not present

## 2024-03-25 DIAGNOSIS — Z992 Dependence on renal dialysis: Secondary | ICD-10-CM | POA: Diagnosis not present

## 2024-03-25 DIAGNOSIS — N186 End stage renal disease: Secondary | ICD-10-CM | POA: Diagnosis not present

## 2024-03-26 DIAGNOSIS — Z992 Dependence on renal dialysis: Secondary | ICD-10-CM | POA: Diagnosis not present

## 2024-03-26 DIAGNOSIS — N2581 Secondary hyperparathyroidism of renal origin: Secondary | ICD-10-CM | POA: Diagnosis not present

## 2024-03-26 DIAGNOSIS — N186 End stage renal disease: Secondary | ICD-10-CM | POA: Diagnosis not present

## 2024-03-27 ENCOUNTER — Ambulatory Visit: Payer: Self-pay

## 2024-03-27 DIAGNOSIS — N2581 Secondary hyperparathyroidism of renal origin: Secondary | ICD-10-CM | POA: Diagnosis not present

## 2024-03-27 DIAGNOSIS — Z992 Dependence on renal dialysis: Secondary | ICD-10-CM | POA: Diagnosis not present

## 2024-03-27 DIAGNOSIS — N186 End stage renal disease: Secondary | ICD-10-CM | POA: Diagnosis not present

## 2024-03-27 NOTE — Telephone Encounter (Signed)
 I do not see where the ED placed a referral for a back/spine specialist. We can evaluate her, complete an xray, and try to help.

## 2024-03-27 NOTE — Telephone Encounter (Signed)
 Copied from CRM [id number]. Topic: Clinical - Medical Advice >> Mar 27, 2024 12:56 PM Jim Motts C wrote: Reason for CRM: Patient would appreciate a call back for next steps. [phone]. She stated she went to the ER last Wednesday and had a referral to a vein specialist and spine specialist. She stated the vein doctor couldn't really help her and she hasn't heard from the other doctor. She's just not sure what to do to help herself. Patient contact is [phone].

## 2024-03-28 ENCOUNTER — Other Ambulatory Visit (HOSPITAL_COMMUNITY): Payer: Self-pay

## 2024-03-28 DIAGNOSIS — N186 End stage renal disease: Secondary | ICD-10-CM | POA: Diagnosis not present

## 2024-03-28 DIAGNOSIS — Z992 Dependence on renal dialysis: Secondary | ICD-10-CM | POA: Diagnosis not present

## 2024-03-28 DIAGNOSIS — N2581 Secondary hyperparathyroidism of renal origin: Secondary | ICD-10-CM | POA: Diagnosis not present

## 2024-03-28 NOTE — Telephone Encounter (Signed)
 Pharmacy Patient Advocate Encounter  Received notification from [employer] that Prior Authorization for  Lidocaine  5% patches has been APPROVED from 03/21/2024 to 10/16/2024. Unable to obtain price due to refill too soon rejection, last fill date 03/22/2024 next available fill date06/29/2025

## 2024-03-28 NOTE — Telephone Encounter (Signed)
 Reviewed Onalee Bien message with patient, she verbalized understanding. Scheduled appt with Polly Brink on 03/29/24

## 2024-03-29 ENCOUNTER — Ambulatory Visit (INDEPENDENT_AMBULATORY_CARE_PROVIDER_SITE_OTHER)
Admission: RE | Admit: 2024-03-29 | Discharge: 2024-03-29 | Disposition: A | Discharge status: Home / Self Care | Source: Ambulatory Visit | Attending: Primary Care | Admitting: Primary Care

## 2024-03-29 ENCOUNTER — Ambulatory Visit (INDEPENDENT_AMBULATORY_CARE_PROVIDER_SITE_OTHER): Admitting: Primary Care

## 2024-03-29 ENCOUNTER — Encounter: Payer: Self-pay | Admitting: Primary Care

## 2024-03-29 VITALS — BP 126/82 | HR 91 | Temp 97.4°F | Ht 65.0 in | Wt 168.0 lb

## 2024-03-29 DIAGNOSIS — L732 Hidradenitis suppurativa: Secondary | ICD-10-CM

## 2024-03-29 DIAGNOSIS — M5441 Lumbago with sciatica, right side: Secondary | ICD-10-CM

## 2024-03-29 DIAGNOSIS — M47816 Spondylosis without myelopathy or radiculopathy, lumbar region: Secondary | ICD-10-CM | POA: Diagnosis not present

## 2024-03-29 DIAGNOSIS — R229 Localized swelling, mass and lump, unspecified: Secondary | ICD-10-CM | POA: Insufficient documentation

## 2024-03-29 DIAGNOSIS — M545 Low back pain, unspecified: Secondary | ICD-10-CM | POA: Insufficient documentation

## 2024-03-29 DIAGNOSIS — M5442 Lumbago with sciatica, left side: Secondary | ICD-10-CM | POA: Diagnosis not present

## 2024-03-29 DIAGNOSIS — N186 End stage renal disease: Secondary | ICD-10-CM | POA: Diagnosis not present

## 2024-03-29 DIAGNOSIS — N2581 Secondary hyperparathyroidism of renal origin: Secondary | ICD-10-CM | POA: Diagnosis not present

## 2024-03-29 DIAGNOSIS — Z992 Dependence on renal dialysis: Secondary | ICD-10-CM | POA: Diagnosis not present

## 2024-03-29 MED ORDER — METHYLPREDNISOLONE ACETATE 80 MG/ML IJ SUSP: 80.0000 mg | Freq: Once | INTRAMUSCULAR | Status: AC

## 2024-03-29 MED ORDER — PREDNISONE 20 MG PO TABS: ORAL_TABLET | ORAL | 0 refills | Status: DC

## 2024-03-29 NOTE — Progress Notes (Signed)
 Subjective:    Patient ID: [name], female    DOB: 18-Jan-1966, 58 y.o.   MRN: [id number]  HPI  [name] is a very pleasant 58 y.o. female with a significant medical history including hypertension, end-stage renal disease on peritoneal dialysis, aortic dissection status postrepair, renal stones, neuropathy, type 2 diabetes, thyroid  cancer, breast cancer, HIV, hyperlipidemia, tobacco use who presents today for ED follow-up and back pain.  She presented to [facility] ED on 03/20/2024 for left flank and lower back pain without bowel/bladder incontinence, lower extremity paresthesias.  During her stay in the ED she was treated with IV fluids, IV fentanyl .  She underwent CTA chest/abdomen/pelvis which revealed infrarenal dissection that is unchanged from prior scan.  Vascular services was consulted who will do not believe her pain was coming from her infrarenal aneurysm.  It was recommended she follow-up in the outpatient setting.  Her blood pressure medication was held due to soft blood pressures in the ED.  She was discharged home with recommendations for spine specialist evaluation.  Today she continues to experience left upper and lumbar pain. Symptoms are worse with standing and walking, improved with laying on her left side. Her pain will radiate down her legs with numbness. She denies loss bowel/bladder control.  She's taken cyclobenzaprine  without improvement. She is also taking 1 Tylenol  and 1 Tramadol  which helps temporarily. The lidocaine  patch helps some.   She's also noticed a mass to the right posterior lateral forearm 1 week ago. The mass is tender, raised, doesn't drain. She's applied warm compresses which caused the mass to raise through the skin.  She denies fevers.  She is managed on minocycline  100 mg daily for chronic hidradenitis suppurativa.  She has an appointment scheduled in January 2026 with a new dermatologist.   Review of Systems  Genitourinary:         Denies loss of bowel/bladder control  Musculoskeletal:  Positive for arthralgias and back pain.  Skin:  Positive for color change and wound.  Neurological:  Positive for numbness.         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  Genes Analyzed (43 total): APC*, ATM*, AXIN2, BARD1, BMPR1A, BRCA1*, BRCA2*, BRIP1*, CDH1*, CDK4, CDKN2A, CHEK2*, DICER1, GALNT12, HOXB13, MEN1, MLH1*, MRE11A, MSH2*, MSH3, MSH6*, MUTYH*, NBN, NF1*, NTHL1, PALB2*, PMS2*, POLD1,  POLE, PTEN*, RAD50, RAD51C*, RAD51D*, RET, SDHB, SDHD, SMAD4, SMARCA4, STK11 and TP53* (sequencing and deletion/duplication); EGFR (s   GERD 07/24/2006   GRAVE'S DISEASE 01/01/2008   History of hidradenitis suppurativa    History of kidney stones    History of thrush    HIV DISEASE 07/24/2006   dx March 05   Hyperlipidemia     HYPERTENSION 07/24/2006   Hyperthyroidism 08/2006   Grave's Disease -diffuse radiotracer uptake 08/25/06 Thyroid  scan-Cold nodule to R lower lobe of thyrorid   Ileus (HCC) 02/14/2023   Left bundle branch block (LBBB)    Malnutrition of moderate degree (HCC) 02/08/2023   Menometrorrhagia    hx of   Nephrolithiasis    Panniculitis 05/12/2020   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 Completed 2 yrs Prednisone  after lung bx confirmation. No symptoms since then.   Sebaceous cyst 04/21/2020   Suppurative hidradenitis    Thyroid  cancer (HCC)    THYROID  NODULE, RIGHT 02/08/2007    Social History   Socioeconomic History   Marital status: Single    Spouse name: Not on file   Number of children: 1   Years of education: Not on file   Highest education level: Not on file  Occupational History   Occupation: Investment banker, corporate  Tobacco Use   Smoking status: Former    Current packs/day: 0.00    Average packs/day: 0.5 packs/day for 15.0 years (7.5 ttl pk-yrs)    Types: Cigarettes    Start date: 04/12/2017    Quit date: 2019    Years since quitting: 6.4   Smokeless tobacco: Never  Vaping Use   Vaping status: Never Used  Substance and Sexual Activity   Alcohol  use: Not Currently    Comment: occasional wine   Drug use: No   Sexual activity: Not Currently    Birth control/protection: Post-menopausal  Other  Topics Concern   Not on file  Social History Narrative   Right Handed    Lives in a two story home   Social Drivers of Health   Financial Resource Strain: High Risk (01/08/2024)   Overall Financial Resource Strain (CARDIA)    Difficulty of Paying Living Expenses: Hard  Food Insecurity: No Food Insecurity (02/22/2024)   Hunger Vital Sign    Worried About Running Out of Food in the Last Year: Never true    Ran Out of Food in the Last Year: Never true  Recent Concern: Food Insecurity - Food Insecurity Present (01/08/2024)   Hunger Vital Sign    Worried About Running Out of Food in the Last Year: Sometimes true    Ran Out of Food in the Last Year: Sometimes true  Transportation Needs: No Transportation Needs (02/22/2024)   PRAPARE - Administrator, Civil Service (Medical): No    Lack of Transportation (Non-Medical): No  Physical Activity: Not on file  Stress: Not on file  Social  Connections: Not on file  Intimate Partner Violence: Not At Risk (02/22/2024)   Humiliation, Afraid, Rape, and Kick questionnaire    Fear of Current or Ex-Partner: No    Emotionally Abused: No    Physically Abused: No    Sexually Abused: No    Past Surgical History:  Procedure Laterality Date   AORTIC ARCH DEBRANCHING N/A 08/25/2023   Procedure: AORTIC ARCH DEBRANCHING USING 12X8X8MM HEMASHIELD GRAFT;  Surgeon: Bartley Lightning, MD;  Location: [facility];  Service: Open Heart Surgery;  Laterality: N/A;  with bypasses to the innominate and left common carotid arteries   APPLICATION OF WOUND VAC N/A 01/20/2021   Procedure: APPLICATION OF WOUND VAC;  Surgeon: Thornell Flirt, DO;  Location: [facility];  Service: Plastics;  Laterality: N/A;   BREAST EXCISIONAL BIOPSY Right 04/26/2018   right axilla negative   BREAST EXCISIONAL BIOPSY Left 04/26/2018   left axilla negative   BREAST LUMPECTOMY Right 10/03/2018   malignant   BREAST LUMPECTOMY WITH RADIOACTIVE SEED AND SENTINEL LYMPH NODE  BIOPSY Right 10/03/2018   Procedure: RIGHT BREAST LUMPECTOMY WITH RADIOACTIVE SEED AND SENTINEL LYMPH NODE MAPPING;  Surgeon: Sim Dryer, MD;  Location: [facility];  Service: General;  Laterality: Right;   BREAST SURGERY  1997   Breast Reduction    CAPD INSERTION N/A 12/21/2023   Procedure: LAPAROSCOPIC INSERTION CONTINUOUS AMBULATORY PERITONEAL DIALYSIS  (CAPD) CATHETER; LAPAROSCOPIC OMENTUMPEXY;  Surgeon: Carlene Che, MD;  Location: [facility];  Service: Vascular;  Laterality: N/A;   CYSTOSCOPY W/ URETERAL STENT REMOVAL  11/09/2012   Procedure: CYSTOSCOPY WITH STENT REMOVAL;  Surgeon: Osborn Blaze, MD;  Location: [facility];  Service: Urology;  Laterality: Right;   CYSTOSCOPY WITH RETROGRADE PYELOGRAM, URETEROSCOPY AND STENT PLACEMENT  11/09/2012   Procedure: CYSTOSCOPY WITH RETROGRADE PYELOGRAM, URETEROSCOPY AND STENT PLACEMENT;  Surgeon: Osborn Blaze, MD;  Location: [facility];  Service: Urology;  Laterality: Left;  LEFT URETEROSCOPY, STONE MANIPULATION, left STENT exchange    CYSTOSCOPY WITH STENT PLACEMENT  10/02/2012   Procedure: CYSTOSCOPY WITH STENT PLACEMENT;  Surgeon: Osborn Blaze, MD;  Location: [facility];  Service: Urology;  Laterality: Left;   DEBRIDEMENT AND CLOSURE WOUND N/A 01/20/2021   Procedure: Excision of abdominal wound with closure;  Surgeon: Thornell Flirt, DO;  Location: [facility];  Service: Plastics;  Laterality: N/A;   DILATION AND CURETTAGE OF UTERUS  11/2002   s/p for 1st trimester nonviable pregnancy   EYE SURGERY     sty under eyelid   INCISE AND DRAIN ABCESS  08/2002   s/p I &D for righ inframmary fold hidradenitis   INCISION AND DRAINAGE PERITONSILLAR ABSCESS  12/2001   IR CV LINE INJECTION  06/07/2018   IR FLUORO GUIDE CV LINE RIGHT  01/30/2023   IR IMAGING GUIDED PORT INSERTION  06/20/2018   IR REMOVAL TUN ACCESS W/ PORT W/O FL MOD SED  06/20/2018   IR US  GUIDE VASC ACCESS RIGHT  01/30/2023   IRRIGATION AND DEBRIDEMENT ABSCESS  01/31/2012    Procedure: IRRIGATION AND DEBRIDEMENT ABSCESS;  Surgeon: Thayne Fine, MD;  Location: [facility];  Service: General;  Laterality: Right;  right breast and axilla    LAPAROSCOPIC REPOSITIONING CAPD CATHETER Left 01/10/2024   Procedure: Removal of peritoneal dialysis Catheter.;  Surgeon: Carlene Che, MD;  Location: [facility] OR;  Service: Vascular;  Laterality: Left;   LAPAROSCOPY N/A 01/10/2024   Procedure: LAPAROSCOPY, DIAGNOSTIC;  Surgeon: Carlene Che, MD;  [facility];  Service: Vascular;  Laterality: N/A;   NEPHROLITHOTOMY  10/02/2012   Procedure: NEPHROLITHOTOMY PERCUTANEOUS;  Surgeon: Osborn Blaze, MD;  Location: [facility];  Service: Urology;  Laterality: Right;  First Stage Percutaneous Nephrolithotomy with Surgeon Access, Left Ureteral Stent     NEPHROLITHOTOMY  10/04/2012   Procedure: NEPHROLITHOTOMY PERCUTANEOUS SECOND LOOK;  Surgeon: Osborn Blaze, MD;  [facility]: [facility];  Service: Urology;  Laterality: Right;      NEPHROLITHOTOMY  10/08/2012   Procedure: NEPHROLITHOTOMY PERCUTANEOUS;  Surgeon: Osborn Blaze, MD;  Location: [facility];  Service: Urology;  Laterality: Right;  THIRD STAGE, nephrostomy tube exchange x 2   NEPHROLITHOTOMY  10/11/2012   Procedure: NEPHROLITHOTOMY PERCUTANEOUS SECOND LOOK;  Surgeon: Osborn Blaze, MD;  Location: [facility];  Service: Urology;  Laterality: Right;  RIGHT 4 STAGE PERCUTANOUS NEPHROLITHOTOMY, right URETEROSCOPY WITH HOLMIUM LASER    PANNICULECTOMY N/A 12/21/2020   Procedure: PANNICULECTOMY;  Surgeon: Thornell Flirt, DO;  Location: [facility];  Service: Plastics;  Laterality: N/A;   PERCUTANEOUS NEPHROSTOLITHOTOMY  04/2022   PORT-A-CATH REMOVAL N/A 07/16/2020   Procedure: REMOVAL PORT-A-CATH;  Surgeon: Sim Dryer, MD;  Location: [facility];  Service: General;  Laterality: N/A;   PORTACATH PLACEMENT Left 05/17/2018   Procedure: INSERTION PORT-A-CATH;  Surgeon: Oza Blumenthal, MD;  Location: [facility];  Service:  General;  Laterality: Left;   RADICAL NECK DISSECTION  04/12/2017   limited/notes 04/12/2017   RADICAL NECK DISSECTION N/A 04/12/2017   Procedure: RADICAL NECK DISSECTION;  Surgeon: Virgina Grills, MD;  Location: [facility] OR;  Service: ENT;  Laterality: N/A;  limited neck dissection 2 hours total   REDUCTION MAMMAPLASTY Bilateral 1998   RIGHT/LEFT HEART CATH AND CORONARY ANGIOGRAPHY N/A 03/12/2020   Procedure: RIGHT/LEFT HEART CATH AND CORONARY ANGIOGRAPHY;  Surgeon: Mardell Shade, MD;  [facility];  Service: Cardiovascular;  Laterality: N/A;   [place]  1994   TEE WITHOUT CARDIOVERSION N/A 08/25/2023   Procedure: TRANSESOPHAGEAL ECHOCARDIOGRAM;  Surgeon: Bartley Lightning, MD;  Location: [facility];  Service: Open Heart Surgery;  Laterality: N/A;   TENCKHOFF CATHETER INSERTION Left 01/10/2024   Procedure: INSERTION, CATHETER, DIALYSIS, PERITONEAL;  Surgeon: Carlene Che, MD;  Location: [facility];  Service: Vascular;  Laterality: Left;   THORACIC AORTIC ENDOVASCULAR STENT GRAFT N/A 02/02/2023   Procedure: THORACIC AORTIC ENDOVASCULAR STENT GRAFT;  Surgeon: Kayla Part, MD;  [facility];  Service: Vascular;  Laterality: N/A;   THORACIC AORTIC ENDOVASCULAR STENT GRAFT N/A 08/25/2023   Procedure: THORACIC AORTIC ENDOVASCULAR STENT GRAFT;  Surgeon: Kayla Part, MD;  Location: [facility];  Service: Vascular;  Laterality: N/A;   THYROIDECTOMY  04/12/2017   completion/notes 04/12/2017   THYROIDECTOMY N/A 04/12/2017   Procedure: THYROIDECTOMY;  Surgeon: Virgina Grills, MD;  Location: [facility] OR;  Service: ENT;  Laterality: N/A;  Completion Thyroidectomy   TOTAL THYROIDECTOMY  2010   ULTRASOUND GUIDANCE FOR VASCULAR ACCESS Right 08/25/2023   Procedure: ULTRASOUND GUIDANCE FOR VASCULAR ACCESS, RIGHT FEMORAL ARTERY;  Surgeon: Kayla Part, MD;  Location: [facility] OR;  Service: Vascular;  Laterality: Right;    Family History  Problem Relation Age of Onset   Hypertension Mother    Cancer Mother         laryngeal   Heart disease Mother        stent   Pancreatic cancer Father    Hypertension Father    Lung cancer Father 7       hx smoking  Hypertension Sister    Hypertension Sister    Hypertension Brother    Breast cancer Maternal Aunt 51   Breast cancer Maternal Aunt        dx 60+   Cancer Maternal Uncle        Lung CA   Breast cancer Paternal Aunt 81   Breast cancer Paternal Aunt        dx 62's   Breast cancer Paternal Aunt        dx 50's   Prostate cancer Paternal Uncle    Prostate cancer Paternal Uncle    Lung cancer Paternal Uncle    Breast cancer Cousin 68   Breast cancer Cousin        dx <50   Breast cancer Cousin        dx <50   Breast cancer Cousin        dx <50   Heart disease Other    Hypertension Other    Stroke Other        Grandparent   Kidney disease Other        Grandparent   Diabetes Other        FH of Diabetes   Colon cancer Neg Hx    Esophageal cancer Neg Hx    Rectal cancer Neg Hx    Stomach cancer Neg Hx     Allergies  Allergen Reactions   Genvoya [Elviteg-Cobic-Emtricit-Tenofaf] Hives   Lisinopril Cough   Aldactone  [Spironolactone ] Hives   Tegaderm Ag Mesh [Silver] Itching    Current Outpatient Medications on File Prior to Visit  Medication Sig Dispense Refill   acetaminophen  (TYLENOL ) 500 MG tablet Take 1,000 mg by mouth every 6 (six) hours as needed for mild pain.     albuterol  (VENTOLIN  HFA) 108 (90 Base) MCG/ACT inhaler INHALE 2 PUFFS INTO THE LUNGS UP TO EVERY 6HRS AS NEEDED FOR WHEEZING/SHORTNESS OF BREATH 8 each 0   allopurinol  (ZYLOPRIM ) 100 MG tablet Take 1 tablet (100 mg total) by mouth daily. For gout prevention 90 tablet 0   [Paused] amLODipine  (NORVASC ) 10 MG tablet Take 10 mg by mouth daily.     aspirin  EC 81 MG tablet Take 81 mg by mouth daily.     AURYXIA  1 GM 210 MG(Fe) tablet Take 210 mg by mouth 3 (three) times daily with meals.     Blood Glucose Monitoring Suppl (FREESTYLE LITE) w/Device KIT Inject 1 each into  the skin daily. 1 kit 0   carvedilol  (COREG ) 12.5 MG tablet Take 12.5 mg by mouth 2 (two) times daily.     cyclobenzaprine  (FLEXERIL ) 10 MG tablet Take 1 tablet (10 mg total) by mouth 3 (three) times daily as needed for muscle spasms. 30 tablet 0   dolutegravir -lamiVUDine (DOVATO ) 50-300 MG tablet Take 1 tablet by mouth daily. 30 tablet 11   fluticasone  (FLOVENT  HFA) 110 MCG/ACT inhaler Inhale 1 puff into the lungs 2 (two) times daily.     Lancets (ONETOUCH ULTRASOFT) lancets Use as instructed to test blood sugar daily 100 each 5   levocetirizine (XYZAL ) 5 MG tablet Take 1 tablet (5 mg total) by mouth every evening. For allergies 90 tablet 3   lidocaine  (LIDODERM ) 5 % Place 1 patch onto the skin daily. Remove & Discard patch within 12 hours or as directed by MD 30 patch 0   [Paused] losartan  (COZAAR ) 50 MG tablet Take 50 mg by mouth daily.     minocycline  (MINOCIN ) 100 MG capsule Take 1 capsule (100 mg  total) by mouth 2 (two) times daily. 60 capsule 5   rosuvastatin  (CRESTOR ) 10 MG tablet TAKE 1 TABLET BY MOUTH EVERY DAY FOR CHOLESTEROL 90 tablet 2   saxagliptin  HCl (ONGLYZA) 2.5 MG TABS tablet Take 1 tablet (2.5 mg total) by mouth daily. for diabetes. 90 tablet 1   SYNTHROID  150 MCG tablet TAKE 1 TABLET BY MOUTH DAILY BEFORE BREAKFAST. 90 tablet 1   traMADol  (ULTRAM ) 50 MG tablet Take 1 tablet (50 mg total) by mouth every 12 (twelve) hours as needed. 15 tablet 0   No current facility-administered medications on file prior to visit.    BP 126/82   Pulse 91   Temp (!) 97.4 F (36.3 C) (Temporal)   Ht 5' 5 (1.651 m)   Wt 168 lb (76.2 kg)   LMP 03/31/2014 (LMP Unknown)   SpO2 97%   BMI 27.96 kg/m  Objective:   Physical Exam Constitutional:      General: She is not in acute distress.  Cardiovascular:     Rate and Rhythm: Normal rate.  Pulmonary:     Effort: Pulmonary effort is normal.   Musculoskeletal:     Lumbar back: No tenderness or bony tenderness. Decreased range of motion.  Negative right straight leg raise test and negative left straight leg raise test.       Back:     Comments: Pain is located to the left sacroiliac joint. 5 out of 5 strength to bilateral lower extremities   Skin:    Comments: 1 cm rounded, firm, raised superficial, non draining mass to right lateral forearm.    Neurological:     Mental Status: She is alert.           Assessment & Plan:  Acute left-sided low back pain with bilateral sciatica Assessment & Plan: Etiology appears to be coming from left sacroiliac joint.  Depo-Medrol  80 mg provided intramuscularly today. Start prednisone  20 mg tomorrow.  60 mg daily x 3 days, 40 mg daily x 3 days, 20 mg daily x 3 days.  Continue lidocaine  patches and Tylenol  as needed.  Plain films of the lumbar spine ordered and pending today.  Orders: -     DG Lumbar Spine Complete -     predniSONE ; Take 3 tablets by mouth every morning x 3 days, then 2 tablets x 3 days, then 1 tablet x 3 days.  Dispense: 18 tablet; Refill: 0  Hidradenitis suppurativa -     Ambulatory referral to Dermatology  Localized skin mass, lump, or swelling Assessment & Plan: Unclear etiology.  Odd location for hidradenitis suppurativa.  Increase minocycline  to 100 mg twice daily x 5 days. Continue warm compresses. She will update.         Felicia Risinger K Felicia Ricklefs, NP

## 2024-03-29 NOTE — Patient Instructions (Addendum)
 Complete xray(s) prior to leaving today. I will notify you of your results once received.  Start prednisone  tomorrow. Take 3 tablets by mouth every morning x 3 days, then 2 tablets x 3 days, then 1 tablet x 3 days.  Increase your minocycline  to 1 pill twice daily x 5 days.  Continue warm compresses.  It was a pleasure to see you today!

## 2024-03-29 NOTE — Assessment & Plan Note (Signed)
 Unclear etiology.  Odd location for hidradenitis suppurativa.  Increase minocycline  to 100 mg twice daily x 5 days. Continue warm compresses. She will update.

## 2024-03-29 NOTE — Addendum Note (Signed)
 Addended by: Kyle Pho on: 03/29/2024 02:50 PM   Modules accepted: Orders

## 2024-03-29 NOTE — Assessment & Plan Note (Signed)
 Etiology appears to be coming from left sacroiliac joint.  Depo-Medrol  80 mg provided intramuscularly today. Start prednisone  20 mg tomorrow.  60 mg daily x 3 days, 40 mg daily x 3 days, 20 mg daily x 3 days.  Continue lidocaine  patches and Tylenol  as needed.  Plain films of the lumbar spine ordered and pending today.

## 2024-03-30 DIAGNOSIS — N186 End stage renal disease: Secondary | ICD-10-CM | POA: Diagnosis not present

## 2024-03-30 DIAGNOSIS — N2581 Secondary hyperparathyroidism of renal origin: Secondary | ICD-10-CM | POA: Diagnosis not present

## 2024-03-30 DIAGNOSIS — Z992 Dependence on renal dialysis: Secondary | ICD-10-CM | POA: Diagnosis not present

## 2024-03-31 DIAGNOSIS — Z992 Dependence on renal dialysis: Secondary | ICD-10-CM | POA: Diagnosis not present

## 2024-03-31 DIAGNOSIS — N186 End stage renal disease: Secondary | ICD-10-CM | POA: Diagnosis not present

## 2024-03-31 DIAGNOSIS — N2581 Secondary hyperparathyroidism of renal origin: Secondary | ICD-10-CM | POA: Diagnosis not present

## 2024-04-01 DIAGNOSIS — N2581 Secondary hyperparathyroidism of renal origin: Secondary | ICD-10-CM | POA: Diagnosis not present

## 2024-04-01 DIAGNOSIS — Z992 Dependence on renal dialysis: Secondary | ICD-10-CM | POA: Diagnosis not present

## 2024-04-01 DIAGNOSIS — N186 End stage renal disease: Secondary | ICD-10-CM | POA: Diagnosis not present

## 2024-04-02 DIAGNOSIS — N186 End stage renal disease: Secondary | ICD-10-CM | POA: Diagnosis not present

## 2024-04-02 DIAGNOSIS — N2581 Secondary hyperparathyroidism of renal origin: Secondary | ICD-10-CM | POA: Diagnosis not present

## 2024-04-02 DIAGNOSIS — Z992 Dependence on renal dialysis: Secondary | ICD-10-CM | POA: Diagnosis not present

## 2024-04-03 ENCOUNTER — Other Ambulatory Visit: Payer: Self-pay

## 2024-04-03 ENCOUNTER — Ambulatory Visit: Payer: Self-pay | Admitting: Primary Care

## 2024-04-03 DIAGNOSIS — N2581 Secondary hyperparathyroidism of renal origin: Secondary | ICD-10-CM | POA: Diagnosis not present

## 2024-04-03 DIAGNOSIS — N186 End stage renal disease: Secondary | ICD-10-CM | POA: Diagnosis not present

## 2024-04-03 DIAGNOSIS — Z992 Dependence on renal dialysis: Secondary | ICD-10-CM | POA: Diagnosis not present

## 2024-04-03 NOTE — Patient Outreach (Signed)
 Complex Care Management   Visit Note  04/03/2024  Name:  [name] MRN: [id number] DOB: 12-03-1965  Situation: Referral received for Complex Care Management related to ESRD I obtained verbal consent from Patient.  Visit completed with patient  on the phone  Background:   Past Medical History:  Diagnosis Date   Acute pain of right shoulder due to trauma 06/08/2023   Acute pancreatitis 10/23/2021   Acute renal failure superimposed on stage 4 chronic kidney disease (HCC) 10/24/2021   Allergy    Anemia    Normocytic   Antibiotic-induced yeast infection 09/28/2020   Anxiety    Asthma    Bronchitis 2005   Bursitis of left shoulder 09/06/2019   CKD (chronic kidney disease)    Dialysis on Mon-Wed- Fri   CLASS 1-EXOPHTHALMOS-THYROTOXIC 02/08/2007   Cognitive dysfunction 02/21/2020   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  Genes Analyzed (43 total): APC*, ATM*, AXIN2, BARD1, BMPR1A, BRCA1*, BRCA2*, BRIP1*, CDH1*, CDK4, CDKN2A, CHEK2*, DICER1, GALNT12, HOXB13, MEN1, MLH1*, MRE11A, MSH2*, MSH3, MSH6*, MUTYH*, NBN, NF1*, NTHL1, PALB2*, PMS2*, POLD1, POLE, PTEN*, RAD50, RAD51C*, RAD51D*, RET, SDHB, SDHD, SMAD4, SMARCA4, STK11 and TP53* (sequencing and deletion/duplication); EGFR (s   GERD 07/24/2006   GRAVE'S DISEASE 01/01/2008   History of hidradenitis suppurativa    History of kidney stones     History of thrush    HIV DISEASE 07/24/2006   dx March 05   Hyperlipidemia    HYPERTENSION 07/24/2006   Hyperthyroidism 08/2006   Grave's Disease -diffuse radiotracer uptake 08/25/06 Thyroid  scan-Cold nodule to R lower lobe of thyrorid   Ileus (HCC) 02/14/2023   Left bundle branch block (LBBB)    Malnutrition of moderate degree (HCC) 02/08/2023   Menometrorrhagia    hx of   Nephrolithiasis    Panniculitis 05/12/2020   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 Completed 2 yrs Prednisone  after lung bx confirmation. No symptoms since then.   Sebaceous cyst 04/21/2020   Suppurative hidradenitis    Thyroid  cancer (HCC)    THYROID  NODULE, RIGHT 02/08/2007    Assessment: Patient Reported Symptoms:  Cognitive Cognitive Status: Alert and oriented to person, place, and time, Insightful and able to interpret abstract concepts, Normal speech and language skills      Neurological Neurological Review of Symptoms: No symptoms  reported    HEENT HEENT Symptoms Reported: No symptoms reported      Cardiovascular Cardiovascular Symptoms Reported: No symptoms reported    Respiratory Respiratory Symptoms Reported: No symptoms reported    Endocrine Patient reports the following symptoms related to hypoglycemia or hyperglycemia : No symptoms reported    Gastrointestinal Gastrointestinal Symptoms Reported: Change in  appetite, Nausea Additional Gastrointestinal Details: patient reports symptoms of food/ drink Gastrointestinal Conditions: Nausea, Reflux/heartburn Gastrointestinal Management Strategies: Diet modification Gastrointestinal Self-Management Outcome: 3 (uncertain) Nutrition Risk Screen (CP): Reduced oral intake over the last month  Genitourinary Genitourinary Symptoms Reported: No symptoms reported Genitourinary Conditions: End-stage renal disease Genitourinary Management Strategies: Peritoneal dialysis Peritoneal Dialysis Schedule: Reports on dialysis 10 hrs per HS daily Peritoneal Dialysis Last Treatment: 04/02/24 Genitourinary Self-Management Outcome: 4 (good)  Integumentary Integumentary Symptoms Reported: Other Other Integumentary Symptoms: patient reports having a mass on her right forearm that is tender to touch for approximately 1 week. She reports having a follow up visit with her primary care provider and these symptoms were addressed.  Patient states her doctor increased her minocycline  and she is applying warm compresses. Patient states she is waiting for a return call from the dermatology office to schedule and appointment for further evaluation. Skin Conditions: Other Other Skin Conditions: hidradenitis suppurativa Skin Management Strategies: Routine screening, Medication therapy (warm compresses) Skin Self-Management Outcome: 3 (uncertain)  Musculoskeletal Musculoskelatal Symptoms Reviewed: No symptoms reported   Falls in the past year?: No    Psychosocial Psychosocial Symptoms Reported: No symptoms reported            04/03/2024   11:08 AM  Depression screen PHQ 2/9  Decreased Interest 0  Down, Depressed, Hopeless 0  PHQ - 2 Score 0    There were no vitals filed for this visit.  Medications Reviewed Today     Reviewed by Iara Monds E, RN (Registered Nurse) on 04/03/24 at 1117  Med List Status: <None>   Medication Order Taking? Sig Documenting Provider Last  Dose Status Informant  acetaminophen  (TYLENOL ) 500 MG tablet [id number] Yes Take 1,000 mg by mouth every 6 (six) hours as needed for mild pain. [provider]  Active Self  albuterol  (VENTOLIN  HFA) 108 (90 Base) MCG/ACT inhaler [id number] Yes INHALE 2 PUFFS INTO THE LUNGS UP TO EVERY 6HRS AS NEEDED FOR WHEEZING/SHORTNESS OF BREATH Clark, Katherine K, NP  Active Self  allopurinol  (ZYLOPRIM ) 100 MG tablet [id number] Yes Take 1 tablet (100 mg total) by mouth daily. For gout prevention Clark, Katherine K, NP  Active   amLODipine  (NORVASC ) 10 MG tablet [id number] Yes Take 10 mg by mouth daily. [provider]  Active Self  aspirin  EC 81 MG tablet [id number] Yes Take 81 mg by mouth daily. [provider]  Active Self  AURYXIA  1 GM 210 MG(Fe) tablet [id number] Yes Take 210 mg by mouth 3 (three) times daily with meals. [provider]  Active Self  Blood Glucose Monitoring Suppl (FREESTYLE LITE) w/Device KIT [id number]  Inject 1 each into the skin daily. Maudine Sos, MD  Active Self  carvedilol  (COREG ) 12.5 MG tablet [id number] Yes Take 12.5 mg by mouth 2 (two) times daily. [provider]  Active Self  cyclobenzaprine  (FLEXERIL ) 10 MG tablet [id number] Yes Take 1 tablet (10 mg total) by mouth 3 (three) times daily as needed for muscle spasms. Clark, Katherine K, NP  Active   dolutegravir -lamiVUDine (DOVATO ) 50-300 MG tablet [id number] Yes Take 1 tablet by mouth daily. Orson Blalock, NP  Active   fluticasone  (FLOVENT  HFA) 110 MCG/ACT inhaler [id number] Yes Inhale 1 puff into the lungs 2 (two) times daily. [provider]  Active Self           Med Note Vivian Groom, MELISSA R   Fri Dec 15, 2023 12:45 PM)    Lancets Emmett Harman ULTRASOFT) lancets [id number]  Use as instructed to test blood sugar daily Clark, Katherine K, NP  Active   levocetirizine (XYZAL ) 5 MG tablet [id number]  Take 1 tablet (5 mg total) by mouth every evening. For allergies Clark, Katherine K,  NP  Active Self  lidocaine  (LIDODERM ) 5 % [id number] Yes Place 1 patch onto the skin daily. Remove & Discard patch within 12 hours or as directed by MD Clark, Katherine K, NP  Active   losartan  (COZAAR ) 50 MG tablet [id number] Yes Take 50 mg by mouth daily. [provider]  Active Self  minocycline  (MINOCIN ) 100 MG capsule [id number] Yes Take 1 capsule (100 mg total) by mouth 2 (two) times daily. Orson Blalock, NP  Active   predniSONE  (DELTASONE ) 20 MG tablet [id number] Yes Take 3 tablets by mouth every morning x 3 days, then 2 tablets x 3 days, then 1 tablet x 3 days. Gabriel John, NP  Active   rosuvastatin  (CRESTOR ) 10 MG tablet [id number]  TAKE 1 TABLET BY MOUTH EVERY DAY FOR CHOLESTEROL  Patient not taking: Reported on 04/03/2024   Clark, Katherine K, NP  Active   saxagliptin  HCl (ONGLYZA) 2.5 MG TABS tablet [id number] Yes Take 1 tablet (2.5 mg total) by mouth daily. for diabetes. Gabriel John, NP  Active   SYNTHROID  150 MCG tablet [id number] Yes TAKE 1 TABLET BY MOUTH DAILY BEFORE BREAKFAST. Emilie Harden, MD  Active   traMADol  (ULTRAM ) 50 MG tablet [id number] Yes Take 1 tablet (50 mg total) by mouth every 12 (twelve) hours as needed. Ninetta Basket, MD  Active             Recommendation:   PCP Follow-up  Follow Up Plan:   Telephone follow-up in 1 month.  Patient transferring to Gilberto Labella RN for ongoing case management follow up.  Patient agreeable to transfer and ongoing follow up.  Verba Girt RN, BSN, CCM [employer], Population Health Case Manager Phone: [phone]

## 2024-04-03 NOTE — Patient Instructions (Signed)
 Visit Information  Thank you for taking time to visit with me today. Please don't hesitate to contact me if I can be of assistance to you before our next scheduled appointment.  Your next care management appointment is by telephone on 04/30/24 at 11 am with Gilberto Labella, RN case manager  Please call the care guide team at [phone] if you need to cancel, schedule, or reschedule an appointment.   Please call the Suicide and Crisis Lifeline: 988 call 1-800-273-TALK (toll free, 24 hour hotline) if you are experiencing a Mental Health or Behavioral Health Crisis or need someone to talk to.  Verba Girt RN, BSN, CCM [employer], Population Health Case Manager Phone: [phone]

## 2024-04-04 ENCOUNTER — Other Ambulatory Visit: Payer: Self-pay | Admitting: Primary Care

## 2024-04-04 DIAGNOSIS — Z992 Dependence on renal dialysis: Secondary | ICD-10-CM | POA: Diagnosis not present

## 2024-04-04 DIAGNOSIS — K219 Gastro-esophageal reflux disease without esophagitis: Secondary | ICD-10-CM

## 2024-04-04 DIAGNOSIS — N186 End stage renal disease: Secondary | ICD-10-CM | POA: Diagnosis not present

## 2024-04-04 DIAGNOSIS — N2581 Secondary hyperparathyroidism of renal origin: Secondary | ICD-10-CM | POA: Diagnosis not present

## 2024-04-04 MED ORDER — OMEPRAZOLE 40 MG PO CPDR: 40.0000 mg | DELAYED_RELEASE_CAPSULE | Freq: Every day | ORAL | 2 refills | Status: DC

## 2024-04-05 DIAGNOSIS — N186 End stage renal disease: Secondary | ICD-10-CM | POA: Diagnosis not present

## 2024-04-05 DIAGNOSIS — Z992 Dependence on renal dialysis: Secondary | ICD-10-CM | POA: Diagnosis not present

## 2024-04-05 DIAGNOSIS — N2581 Secondary hyperparathyroidism of renal origin: Secondary | ICD-10-CM | POA: Diagnosis not present

## 2024-04-06 DIAGNOSIS — N2581 Secondary hyperparathyroidism of renal origin: Secondary | ICD-10-CM | POA: Diagnosis not present

## 2024-04-06 DIAGNOSIS — Z992 Dependence on renal dialysis: Secondary | ICD-10-CM | POA: Diagnosis not present

## 2024-04-06 DIAGNOSIS — N186 End stage renal disease: Secondary | ICD-10-CM | POA: Diagnosis not present

## 2024-04-07 DIAGNOSIS — N2581 Secondary hyperparathyroidism of renal origin: Secondary | ICD-10-CM | POA: Diagnosis not present

## 2024-04-07 DIAGNOSIS — Z992 Dependence on renal dialysis: Secondary | ICD-10-CM | POA: Diagnosis not present

## 2024-04-07 DIAGNOSIS — N186 End stage renal disease: Secondary | ICD-10-CM | POA: Diagnosis not present

## 2024-04-08 DIAGNOSIS — N186 End stage renal disease: Secondary | ICD-10-CM | POA: Diagnosis not present

## 2024-04-08 DIAGNOSIS — Z992 Dependence on renal dialysis: Secondary | ICD-10-CM | POA: Diagnosis not present

## 2024-04-08 DIAGNOSIS — N2581 Secondary hyperparathyroidism of renal origin: Secondary | ICD-10-CM | POA: Diagnosis not present

## 2024-04-09 DIAGNOSIS — Z992 Dependence on renal dialysis: Secondary | ICD-10-CM | POA: Diagnosis not present

## 2024-04-09 DIAGNOSIS — N2581 Secondary hyperparathyroidism of renal origin: Secondary | ICD-10-CM | POA: Diagnosis not present

## 2024-04-09 DIAGNOSIS — N186 End stage renal disease: Secondary | ICD-10-CM | POA: Diagnosis not present

## 2024-04-10 DIAGNOSIS — N186 End stage renal disease: Secondary | ICD-10-CM | POA: Diagnosis not present

## 2024-04-10 DIAGNOSIS — N2581 Secondary hyperparathyroidism of renal origin: Secondary | ICD-10-CM | POA: Diagnosis not present

## 2024-04-10 DIAGNOSIS — Z992 Dependence on renal dialysis: Secondary | ICD-10-CM | POA: Diagnosis not present

## 2024-04-11 ENCOUNTER — Other Ambulatory Visit: Payer: Self-pay | Admitting: Primary Care

## 2024-04-11 DIAGNOSIS — N2581 Secondary hyperparathyroidism of renal origin: Secondary | ICD-10-CM | POA: Diagnosis not present

## 2024-04-11 DIAGNOSIS — M1A09X0 Idiopathic chronic gout, multiple sites, without tophus (tophi): Secondary | ICD-10-CM

## 2024-04-11 DIAGNOSIS — N186 End stage renal disease: Secondary | ICD-10-CM | POA: Diagnosis not present

## 2024-04-11 DIAGNOSIS — Z992 Dependence on renal dialysis: Secondary | ICD-10-CM | POA: Diagnosis not present

## 2024-04-12 DIAGNOSIS — N186 End stage renal disease: Secondary | ICD-10-CM | POA: Diagnosis not present

## 2024-04-12 DIAGNOSIS — Z992 Dependence on renal dialysis: Secondary | ICD-10-CM | POA: Diagnosis not present

## 2024-04-12 DIAGNOSIS — N2581 Secondary hyperparathyroidism of renal origin: Secondary | ICD-10-CM | POA: Diagnosis not present

## 2024-04-13 DIAGNOSIS — Z992 Dependence on renal dialysis: Secondary | ICD-10-CM | POA: Diagnosis not present

## 2024-04-13 DIAGNOSIS — N186 End stage renal disease: Secondary | ICD-10-CM | POA: Diagnosis not present

## 2024-04-13 DIAGNOSIS — N2581 Secondary hyperparathyroidism of renal origin: Secondary | ICD-10-CM | POA: Diagnosis not present

## 2024-04-14 DIAGNOSIS — Z992 Dependence on renal dialysis: Secondary | ICD-10-CM | POA: Diagnosis not present

## 2024-04-14 DIAGNOSIS — N2581 Secondary hyperparathyroidism of renal origin: Secondary | ICD-10-CM | POA: Diagnosis not present

## 2024-04-14 DIAGNOSIS — N186 End stage renal disease: Secondary | ICD-10-CM | POA: Diagnosis not present

## 2024-04-15 DIAGNOSIS — E1122 Type 2 diabetes mellitus with diabetic chronic kidney disease: Secondary | ICD-10-CM | POA: Diagnosis not present

## 2024-04-15 DIAGNOSIS — N186 End stage renal disease: Secondary | ICD-10-CM | POA: Diagnosis not present

## 2024-04-15 DIAGNOSIS — Z992 Dependence on renal dialysis: Secondary | ICD-10-CM | POA: Diagnosis not present

## 2024-04-15 DIAGNOSIS — N2581 Secondary hyperparathyroidism of renal origin: Secondary | ICD-10-CM | POA: Diagnosis not present

## 2024-04-16 DIAGNOSIS — N2581 Secondary hyperparathyroidism of renal origin: Secondary | ICD-10-CM | POA: Diagnosis not present

## 2024-04-16 DIAGNOSIS — N186 End stage renal disease: Secondary | ICD-10-CM | POA: Diagnosis not present

## 2024-04-16 DIAGNOSIS — Z992 Dependence on renal dialysis: Secondary | ICD-10-CM | POA: Diagnosis not present

## 2024-04-17 DIAGNOSIS — N186 End stage renal disease: Secondary | ICD-10-CM | POA: Diagnosis not present

## 2024-04-17 DIAGNOSIS — Z992 Dependence on renal dialysis: Secondary | ICD-10-CM | POA: Diagnosis not present

## 2024-04-17 DIAGNOSIS — N2581 Secondary hyperparathyroidism of renal origin: Secondary | ICD-10-CM | POA: Diagnosis not present

## 2024-04-18 ENCOUNTER — Ambulatory Visit: Admitting: Internal Medicine

## 2024-04-18 DIAGNOSIS — Z992 Dependence on renal dialysis: Secondary | ICD-10-CM | POA: Diagnosis not present

## 2024-04-18 DIAGNOSIS — N186 End stage renal disease: Secondary | ICD-10-CM | POA: Diagnosis not present

## 2024-04-18 DIAGNOSIS — N2581 Secondary hyperparathyroidism of renal origin: Secondary | ICD-10-CM | POA: Diagnosis not present

## 2024-04-18 NOTE — Progress Notes (Deleted)
 Patient ID: [name], female   DOB: 04-Jun-1966, 58 y.o.   MRN: [id number]  HPI  [name] is a 58 y.o.-year-old female, returning for follow-up for thyroid  cancer and uncontrolled postsurgical hypothyroidism.  She previously saw Dr. Kassie, last visit 10/2021.  Last visit with me 1 year and 4 months ago (video).  Interim history: No palpitations, heat intolerance or anxiety. Since last visit, she was diagnosed with laryngopharyngeal reflux and is on PPIs.  I reviewed her thyroid  cancer hx. -Per Dr. Laymond note and review of the chart: 2007 thyr NM scan consistent with Graves disease, except for 1 cold area 2008 Dominant 2.5 cm solid nodule in the inferior right lobe, corresponding to cold defect on NM. 2008 thyroid  bx was suspicious.  2009 thyroidectomy (right lobectomy) --pathol c/w Grave's dz, with adenomatous nodule.  2/18 CT Multiple enlarged bilateral cervical lymph nodes. Residual 10 mm nodule at the thyroidectomy bed with similar nodules superior to the thyroid  on the left, concerning for residual or recurrent thyroid  tissue. Neoplasm is not excluded.  Single low-density noted in the posterior left level 2 station of undetermined significance. 3/18 LYMPH NODE , FNA, LEFT CERVICAL, STRONGLY FAVORS METASTATIC PTC: LYMPH NODE , FINE NEEDLE ASPIRATION LEFT CERVICAL NECK (SPECIMEN 2 OF 2 COLLECTED 01/12/2017) METASTATIC CARCINOMA. SEE COMMENT. COMMENT: THE FINDINGS STRONGLY FAVOR METASTATIC PAPILLARY THYROID  CARCINOMA. DR. LAMAR HILLARD HAS REVIEWED THE CASE AND CONCURS WITH THIS INTERPRETATION. DR. CARLIE WAS PAGED ON 01/13/2017. Specimen Clinical Information Left cervical lymph node, Patient status post thyroidectomy 2008 Source Lymph Node, Fine Needle Aspiration, Left Cervical, (Specimen 2 of 2, collected on 01/12/17 ) 3/18 THYROID , FINE NEEDLE ASPIRATION, LEFT BETHESDA CATEGORY II: THYROID , FINE NEEDLE ASPIRATION, LEFT (SPECIMEN 1 OF 2 COLLECTED  01/12/2017) CONSISTENT WITH BENIGN FOLLICULAR TISSUE (BETHESDA CATEGORY II). Specimen Clinical Information Nodule within bed of left thyroid , Post thyroidectomy 2008 Source Thyroid , Fine Needle Aspiration, Left, (Specimen 1 of 2, collected on 01/12/17 ) 5/18 NM scan: Expected tissue within the thyroid  bed. There is a second adjacent smaller focus of less intense uptake.   6/18 left thyroid  lobectomy (completion thyroidectomy), and LN's, Left zone 3 Neck Contents.  NO MALIGNANCY.  Dr. Vaughan CARLIE: 1. Thyroid , lobectomy, Left - NODULAR HYPERPLASIA. - NO MALIGNANCY IDENTIFIED. 2. Lymph nodes, regional resection, Left zone 3 Neck Contents - BENIGN THYROID  WITH NODULAR HYPERPLASIA. - NO LYMPH NODE TISSUE IDENTIFIED. - NO MALIGNANCY IDENTIFIED. 8/18 NM scan: abnormal focus in the right side of neck, suspicious for nodal metastasis.   9/18 US : 1 non pathologically enlarged lymph node.  10/21 US : normal (thyroidect) 9/22 US : 1. Surgical changes of total thyroidectomy without evidence of residual or recurrent thyroid  tissue. 2. Bilateral prominent and borderline enlarged cervical chain lymph nodes. The node previously identified on the left remains unchanged at 0.4 cm in short axis. However, at least 1 additional new and slightly enlarged lymph node is now identified on both the left and the right. Differential considerations include reactive adenopathy, or less likely an early lymphoproliferative process, or metastatic disease. Reactive adenopathy is favored. However, continued clinical surveillance is recommended. If there is biochemical evidence of recurrent thyroid  cancer, or if the lymph nodes demonstrate enlargement over time, repeat imaging and possibly tissue sampling may become warranted. 01/06/2022: RAI treatment with 108.2 mCi I-131 01/13/2022: Posttreatment whole-body scan: Focus of abnormal tracer in the right superior cervical region likely a lymph node metastasis  Lab Results   Component Value Date   THYROGLB 0.6 (L) 08/23/2022  THYROGLB 2.2 (L) 03/01/2022   Lab Results  Component Value Date   THGAB <1 08/23/2022   THGAB <1 03/01/2022   Pt denies: - feeling nodules in neck - hoarseness - choking But does have dysphagia/reflux.  Seeing GI.  Postsurgical hypothyroidism -In 02/2022, she misunderstood instructions and she was on LT4 125 mcg (not taking 2x at a time as recommended!). At that time, I advised her to increase the dose to 175 mcg daily -In 08/2022, she again misunderstood instructions and she is actually taking 2x 175 mcg LT4 daily!!! Moreover, she did not return for labs in 5 to 6 weeks as advised so she was on this very high dose of LT4 for 4 months!  I advised her to stop the levothyroxine  for 2 days and then resume 175 mcg daily. -She is currently on 175 mcg daily  She takes levothyroxine  - daily, no missed doses - fasting - with water  - separated by 30 min-1h from b'fast  - no calcium , iron  - + multivitamins 30 min to 1h after LT4 >> moved ~3h later - + Omeprazole  at bedtime  I reviewed pt's thyroid  tests: Lab Results  Component Value Date   TSH 0.16 (L) 01/11/2024   TSH 2.61 08/23/2022   TSH 29.49 (H) 03/01/2022   TSH 54.58 (H) 11/02/2021   TSH 23.96 (H) 07/01/2021   TSH 20.04 (H) 04/30/2021   TSH 6.46 (H) 07/21/2020   TSH 17.400 (H) 01/27/2020   TSH 17.98 (H) 07/11/2019   TSH 34.55 (H) 06/05/2019   FREET4 1.49 08/23/2022   FREET4 0.59 (L) 03/01/2022   FREET4 0.55 (L) 11/02/2021   FREET4 0.75 07/01/2021   FREET4 1.0 04/30/2021   FREET4 1.05 07/21/2020   FREET4 0.94 03/08/2018   FREET4 1.03 10/12/2017   FREET4 1.56 09/01/2017   FREET4 0.87 (L) 06/05/2007    She previously mentioned: - fatigue - weight gain - cold intolerance - no constipation - dry skin - no hair loss  She has + FH of thyroid  disorders in: sister. No FH of thyroid  cancer.  No h/o radiation tx to head or neck besides RAI treatment.  She also has  a history of HIV, BrCA - s/p RxTx, DM2, HL, sarcoidosis.  ROS: + see HPI  Past Medical History:  Diagnosis Date   Acute pain of right shoulder due to trauma 06/08/2023   Acute pancreatitis 10/23/2021   Acute renal failure superimposed on stage 4 chronic kidney disease (HCC) 10/24/2021   Allergy    Anemia    Normocytic   Antibiotic-induced yeast infection 09/28/2020   Anxiety    Asthma    Bronchitis 2005   Bursitis of left shoulder 09/06/2019   CKD (chronic kidney disease)    Dialysis on Mon-Wed- Fri   CLASS 1-EXOPHTHALMOS-THYROTOXIC 02/08/2007   Cognitive dysfunction 02/21/2020   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  Genes Analyzed (43 total): APC*,  ATM*, AXIN2, BARD1, BMPR1A, BRCA1*, BRCA2*, BRIP1*, CDH1*, CDK4, CDKN2A, CHEK2*, DICER1, GALNT12, HOXB13, MEN1, MLH1*, MRE11A, MSH2*, MSH3, MSH6*, MUTYH*, NBN, NF1*, NTHL1, PALB2*, PMS2*, POLD1, POLE, PTEN*, RAD50, RAD51C*, RAD51D*, RET, SDHB, SDHD, SMAD4, SMARCA4, STK11 and TP53* (sequencing and deletion/duplication); EGFR (s   GERD 07/24/2006   GRAVE'S DISEASE 01/01/2008   History of hidradenitis suppurativa    History of kidney stones    History of thrush    HIV DISEASE 07/24/2006   dx March 05   Hyperlipidemia    HYPERTENSION 07/24/2006   Hyperthyroidism 08/2006   Grave's Disease -diffuse radiotracer uptake 08/25/06 Thyroid  scan-Cold nodule to  R lower lobe of thyrorid   Ileus (HCC) 02/14/2023   Left bundle branch block (LBBB)    Malnutrition of moderate degree (HCC) 02/08/2023   Menometrorrhagia    hx of   Nephrolithiasis    Panniculitis 05/12/2020   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   Peripheral vascular disease (HCC)    Personal history of chemotherapy    2020   Personal history of radiation therapy    2020   Pneumonia 2005   Port-A-Cath in place 07/12/2018   Postsurgical hypothyroidism 03/20/2011   Rash 02/16/2012   Recurrent boils 06/11/2014   Recurrent falls 11/05/2020   Renal calculi 10/29/2014   Sarcoidosis 02/08/2007   dx as a teenager in [place] from abnl CXR. Completed 2 yrs Prednisone  after lung bx confirmation. No symptoms since then.   Sebaceous cyst 04/21/2020   Suppurative hidradenitis    Thyroid  cancer (HCC)    THYROID  NODULE, RIGHT 02/08/2007   Past Surgical History:  Procedure Laterality Date   AORTIC ARCH DEBRANCHING N/A 08/25/2023   Procedure: AORTIC ARCH DEBRANCHING USING 12X8X8MM HEMASHIELD GRAFT;  Surgeon: Lucas Dorise POUR, MD;  Location: [facility];  Service: Open Heart Surgery;  Laterality: N/A;  with bypasses to the innominate and left common carotid arteries   APPLICATION OF WOUND VAC N/A 01/20/2021   Procedure: APPLICATION OF WOUND VAC;  Surgeon: Lowery Estefana RAMAN, DO;  Location: [facility];  Service: Plastics;  Laterality: N/A;   BREAST EXCISIONAL BIOPSY Right 04/26/2018   right axilla negative   BREAST EXCISIONAL BIOPSY Left 04/26/2018   left axilla negative   BREAST LUMPECTOMY Right 10/03/2018   malignant   BREAST LUMPECTOMY WITH RADIOACTIVE SEED AND SENTINEL LYMPH NODE BIOPSY Right 10/03/2018   Procedure: RIGHT BREAST LUMPECTOMY WITH RADIOACTIVE SEED AND SENTINEL LYMPH NODE MAPPING;   Surgeon: Vanderbilt Ned, MD;  Location: [facility];  Service: General;  Laterality: Right;   BREAST SURGERY  1997   Breast Reduction    CAPD INSERTION N/A 12/21/2023   Procedure: LAPAROSCOPIC INSERTION CONTINUOUS AMBULATORY PERITONEAL DIALYSIS  (CAPD) CATHETER; LAPAROSCOPIC OMENTUMPEXY;  Surgeon: Magda Ned SAILOR, MD;  Location: [facility];  Service: Vascular;  Laterality: N/A;   CYSTOSCOPY W/ URETERAL STENT REMOVAL  11/09/2012   Procedure: CYSTOSCOPY WITH STENT REMOVAL;  Surgeon: Ricardo Likens, MD;  Location: [facility];  Service: Urology;  Laterality: Right;   CYSTOSCOPY WITH RETROGRADE PYELOGRAM, URETEROSCOPY AND STENT PLACEMENT  11/09/2012   Procedure: CYSTOSCOPY WITH RETROGRADE PYELOGRAM, URETEROSCOPY AND STENT PLACEMENT;  Surgeon: Ricardo Likens, MD;  Location: [facility];  Service: Urology;  Laterality: Left;  LEFT URETEROSCOPY, STONE MANIPULATION, left STENT exchange    CYSTOSCOPY WITH STENT PLACEMENT  10/02/2012   Procedure: CYSTOSCOPY WITH STENT PLACEMENT;  Surgeon: Ricardo Likens, MD;  Location: [facility];  Service: Urology;  Laterality: Left;   DEBRIDEMENT AND CLOSURE WOUND N/A 01/20/2021   Procedure: Excision of abdominal wound with closure;  Surgeon: Lowery Estefana RAMAN, DO;  Location: [facility];  Service: Plastics;  Laterality: N/A;   DILATION AND CURETTAGE OF UTERUS  11/2002   s/p for 1st trimester nonviable pregnancy   EYE SURGERY     sty under eyelid   INCISE AND DRAIN ABCESS  08/2002   s/p I &D for righ inframmary fold hidradenitis   INCISION AND DRAINAGE PERITONSILLAR ABSCESS  12/2001   IR CV LINE INJECTION  06/07/2018   IR FLUORO GUIDE CV LINE RIGHT  01/30/2023   IR IMAGING GUIDED PORT INSERTION  06/20/2018   IR REMOVAL TUN ACCESS W/ PORT W/O FL MOD SED  06/20/2018   IR US  GUIDE VASC ACCESS RIGHT  01/30/2023   IRRIGATION AND DEBRIDEMENT ABSCESS  01/31/2012   Procedure: IRRIGATION AND DEBRIDEMENT ABSCESS;  Surgeon: Alm VEAR Angle, MD;  Location: [facility];  Service: General;   Laterality: Right;  right breast and axilla    LAPAROSCOPIC REPOSITIONING CAPD CATHETER Left 01/10/2024   Procedure: Removal of peritoneal dialysis Catheter.;  Surgeon: Magda Debby SAILOR, MD;  Location: [facility] OR;  Service: Vascular;  Laterality: Left;   LAPAROSCOPY N/A 01/10/2024   Procedure: LAPAROSCOPY, DIAGNOSTIC;  Surgeon: Magda Debby SAILOR, MD;  Location: [facility] OR;  Service: Vascular;  Laterality: N/A;   NEPHROLITHOTOMY  10/02/2012   Procedure: NEPHROLITHOTOMY PERCUTANEOUS;  Surgeon: Ricardo Likens, MD;  Location: [facility];  Service: Urology;  Laterality: Right;  First Stage Percutaneous Nephrolithotomy with Surgeon Access, Left Ureteral Stent     NEPHROLITHOTOMY  10/04/2012   Procedure: NEPHROLITHOTOMY PERCUTANEOUS SECOND LOOK;  Surgeon: Ricardo Likens, MD;  Location: [facility];  Service: Urology;  Laterality: Right;      NEPHROLITHOTOMY  10/08/2012   Procedure: NEPHROLITHOTOMY PERCUTANEOUS;  Surgeon: Ricardo Likens, MD;  Location: [facility];  Service: Urology;  Laterality: Right;  THIRD STAGE, nephrostomy tube exchange x 2   NEPHROLITHOTOMY  10/11/2012   Procedure: NEPHROLITHOTOMY PERCUTANEOUS SECOND LOOK;  Surgeon: Ricardo Likens, MD;  Location: [facility];  Service: Urology;  Laterality: Right;  RIGHT 4 STAGE PERCUTANOUS NEPHROLITHOTOMY, right URETEROSCOPY WITH HOLMIUM LASER    PANNICULECTOMY N/A 12/21/2020   Procedure: PANNICULECTOMY;  Surgeon: Lowery Estefana RAMAN, DO;  [facility]: [facility];  Service: Plastics;  Laterality: N/A;   PERCUTANEOUS NEPHROSTOLITHOTOMY  04/2022   PORT-A-CATH REMOVAL N/A 07/16/2020   Procedure: REMOVAL PORT-A-CATH;  Surgeon: Vanderbilt Debby, MD;  Location: [facility];  Service: General;  Laterality: N/A;   PORTACATH PLACEMENT Left 05/17/2018   Procedure: INSERTION PORT-A-CATH;  Surgeon: Vernetta Berg, MD;  Location: [facility];  Service: General;  Laterality: Left;   RADICAL NECK DISSECTION  04/12/2017   limited/notes 04/12/2017   RADICAL NECK  DISSECTION N/A 04/12/2017   Procedure: RADICAL NECK DISSECTION;  Surgeon: Carlie Clark, MD;  Location: [facility] OR;  Service: ENT;  Laterality: N/A;  limited neck dissection 2 hours total   REDUCTION MAMMAPLASTY Bilateral 1998   RIGHT/LEFT HEART CATH AND CORONARY ANGIOGRAPHY N/A 03/12/2020   Procedure: RIGHT/LEFT HEART CATH AND CORONARY ANGIOGRAPHY;  Surgeon: Cherrie Toribio SAUNDERS, MD;  [facility];  Service: Cardiovascular;  Laterality: N/A;   [place]  1994   TEE WITHOUT CARDIOVERSION N/A 08/25/2023   Procedure: TRANSESOPHAGEAL ECHOCARDIOGRAM;  Surgeon: Lucas Dorise POUR, MD;  Location: [facility];  Service: Open Heart Surgery;  Laterality: N/A;   TENCKHOFF CATHETER INSERTION Left 01/10/2024   Procedure: INSERTION, CATHETER, DIALYSIS, PERITONEAL;  Surgeon: Magda Debby SAILOR, MD;  Location: [facility];  Service: Vascular;  Laterality: Left;   THORACIC AORTIC ENDOVASCULAR STENT GRAFT N/A 02/02/2023   Procedure: THORACIC AORTIC ENDOVASCULAR STENT GRAFT;  Surgeon: Lanis Fonda BRAVO, MD;  Location: [facility] OR;  Service: Vascular;  Laterality: N/A;   THORACIC AORTIC ENDOVASCULAR STENT GRAFT N/A 08/25/2023   Procedure: THORACIC AORTIC ENDOVASCULAR STENT GRAFT;  Surgeon: Lanis Fonda BRAVO, MD;  Location: [facility];  Service: Vascular;  Laterality: N/A;   THYROIDECTOMY  04/12/2017   completion/notes 04/12/2017   THYROIDECTOMY N/A 04/12/2017   Procedure: THYROIDECTOMY;  Surgeon: Carlie Clark, MD;  Location: [facility] OR;  Service: ENT;  Laterality: N/A;  Completion Thyroidectomy   TOTAL THYROIDECTOMY  2010   ULTRASOUND GUIDANCE FOR VASCULAR ACCESS Right 08/25/2023   Procedure: ULTRASOUND GUIDANCE FOR VASCULAR ACCESS, RIGHT FEMORAL ARTERY;  Surgeon: Lanis Fonda BRAVO, MD;  Location: [facility] OR;  Service: Vascular;  Laterality: Right;   Social History   Socioeconomic History   Marital status: Single    Spouse name: Not on file   Number of children: 1   Years of education: Not on file   Highest education level: Not on file   Occupational History   Occupation: Investment banker, corporate  Tobacco Use   Smoking status: Former    Current packs/day: 0.00    Average packs/day: 0.5 packs/day for 15.0 years (7.5 ttl pk-yrs)    Types: Cigarettes    Start date: 04/12/2017    Quit date: 2019    Years since quitting: 6.5   Smokeless tobacco: Never  Vaping Use   Vaping status: Never Used  Substance and Sexual Activity   Alcohol  use: Not Currently    Comment: occasional wine   Drug use: No   Sexual activity: Not Currently    Birth control/protection: Post-menopausal  Other Topics Concern   Not on file  Social History Narrative   Right Handed    Lives in a two story home   Social Drivers of Health   Financial Resource Strain: High Risk (01/08/2024)   Overall Financial Resource Strain (CARDIA)    Difficulty of Paying Living Expenses: Hard  Food Insecurity: No Food Insecurity (02/22/2024)   Hunger Vital Sign    Worried About Running Out of Food in the Last Year: Never true    Ran Out of Food in the Last Year: Never true  Recent Concern: Food Insecurity - Food Insecurity Present (01/08/2024)   Hunger Vital Sign    Worried About Radiation protection practitioner of Food in the Last Year: Sometimes true    Ran Out of Food in the Last Year: Sometimes true  Transportation Needs: No Transportation Needs (02/22/2024)   PRAPARE - Administrator, Civil Service (Medical): No    Lack of Transportation (Non-Medical): No  Physical Activity: Not on file  Stress: Not on file  Social Connections: Not on file  Intimate Partner Violence: Not At Risk (02/22/2024)   Humiliation, Afraid, Rape, and Kick questionnaire    Fear of Current or Ex-Partner: No    Emotionally Abused: No    Physically Abused: No    Sexually Abused: No   Current Outpatient Medications on File Prior to Visit  Medication Sig Dispense Refill   acetaminophen  (TYLENOL ) 500 MG tablet Take 1,000 mg by mouth every 6 (six) hours as needed for mild pain.     albuterol  (VENTOLIN  HFA)  108 (90  Base) MCG/ACT inhaler INHALE 2 PUFFS INTO THE LUNGS UP TO EVERY 6HRS AS NEEDED FOR WHEEZING/SHORTNESS OF BREATH 8 each 0   allopurinol  (ZYLOPRIM ) 100 MG tablet TAKE 1 TABLET (100 MG TOTAL) BY MOUTH DAILY. FOR GOUT PREVENTION 90 tablet 2   [Paused] amLODipine  (NORVASC ) 10 MG tablet Take 10 mg by mouth daily.     aspirin  EC 81 MG tablet Take 81 mg by mouth daily.     AURYXIA  1 GM 210 MG(Fe) tablet Take 210 mg by mouth 3 (three) times daily with meals.     Blood Glucose Monitoring Suppl (FREESTYLE LITE) w/Device KIT Inject 1 each into the skin daily. 1 kit 0   carvedilol  (COREG ) 12.5 MG tablet Take 12.5 mg by mouth 2 (two) times daily.     cyclobenzaprine  (FLEXERIL ) 10 MG tablet Take 1 tablet (10 mg total) by mouth 3 (three) times daily as needed for muscle spasms. 30 tablet 0   dolutegravir -lamiVUDine (DOVATO ) 50-300 MG tablet Take 1 tablet by mouth daily. 30 tablet 11   fluticasone  (FLOVENT  HFA) 110 MCG/ACT inhaler Inhale 1 puff into the lungs 2 (two) times daily.     Lancets (ONETOUCH ULTRASOFT) lancets Use as instructed to test blood sugar daily 100 each 5   levocetirizine (XYZAL ) 5 MG tablet Take 1 tablet (5 mg total) by mouth every evening. For allergies 90 tablet 3   lidocaine  (LIDODERM ) 5 % Place 1 patch onto the skin daily. Remove & Discard patch within 12 hours or as directed by MD 30 patch 0   [Paused] losartan  (COZAAR ) 50 MG tablet Take 50 mg by mouth daily.     minocycline  (MINOCIN ) 100 MG capsule Take 1 capsule (100 mg total) by mouth 2 (two) times daily. 60 capsule 5   omeprazole  (PRILOSEC) 40 MG capsule Take 1 capsule (40 mg total) by mouth daily. for heartburn. 90 capsule 2   predniSONE  (DELTASONE ) 20 MG tablet Take 3 tablets by mouth every morning x 3 days, then 2 tablets x 3 days, then 1 tablet x 3 days. 18 tablet 0   rosuvastatin  (CRESTOR ) 10 MG tablet TAKE 1 TABLET BY MOUTH EVERY DAY FOR CHOLESTEROL (Patient not taking: Reported on 04/03/2024) 90 tablet 2   saxagliptin  HCl  (ONGLYZA) 2.5 MG TABS tablet Take 1 tablet (2.5 mg total) by mouth daily. for diabetes. 90 tablet 1   SYNTHROID  150 MCG tablet TAKE 1 TABLET BY MOUTH DAILY BEFORE BREAKFAST. 90 tablet 1   traMADol  (ULTRAM ) 50 MG tablet Take 1 tablet (50 mg total) by mouth every 12 (twelve) hours as needed. 15 tablet 0   No current facility-administered medications on file prior to visit.   Allergies  Allergen Reactions   Genvoya [Elviteg-Cobic-Emtricit-Tenofaf] Hives   Lisinopril Cough   Aldactone  [Spironolactone ] Hives   Tegaderm Ag Mesh [Silver] Itching   Family History  Problem Relation Age of Onset   Hypertension Mother    Cancer Mother        laryngeal   Heart disease Mother        stent   Pancreatic cancer Father    Hypertension Father    Lung cancer Father 64       hx smoking   Hypertension Sister    Hypertension Sister    Hypertension Brother    Breast cancer Maternal Aunt 68   Breast cancer Maternal Aunt        dx 60+   Cancer Maternal Uncle        Lung CA  Breast cancer Paternal Aunt 69   Breast cancer Paternal Aunt        dx 79's   Breast cancer Paternal Aunt        dx 50's   Prostate cancer Paternal Uncle    Prostate cancer Paternal Uncle    Lung cancer Paternal Uncle    Breast cancer Cousin 46   Breast cancer Cousin        dx <50   Breast cancer Cousin        dx <50   Breast cancer Cousin        dx <50   Heart disease Other    Hypertension Other    Stroke Other        Grandparent   Kidney disease Other        Grandparent   Diabetes Other        FH of Diabetes   Colon cancer Neg Hx    Esophageal cancer Neg Hx    Rectal cancer Neg Hx    Stomach cancer Neg Hx    PE: LMP 03/31/2014 (LMP Unknown)  Wt Readings from Last 3 Encounters:  03/29/24 168 lb (76.2 kg)  03/20/24 172 lb (78 kg)  03/13/24 169 lb (76.7 kg)   Constitutional:  in NAD  The physical exam was not performed (virtual visit).  ASSESSMENT: 1. Thyroid  cancer - see HPI  2. Postsurgical  Hypothyroidism -Uncontrolled  3. DM2  PLAN:  1. Thyroid  cancer - papillary  -Patient with a interesting long history of metastatic papillary thyroid  cancer, detected in cervical nodes, without thyroid  primary tumor found.  There is no evidence of a thyroglossal duct cyst or ectopic thyroid  tissue on imaging. - she initially had right lobectomy in 2009 which was negative for thyroid  cancer.  After having had a lymph node biopsy showing PTC metastasis in the left lateral neck, she had a completion thyroidectomy by Dr. Carlie in 2018.  Very interestingly, the final pathology showed no malignancy.  -She had RAI treatment and a posttreatment whole-body scan showed a focus of abnormal tracer in the right superior cervical neck, consistent with a lymph node metastasis.  We discussed that the RAI treatment may have ablated this focus.   -At last visit, we checked her thyroglobulin and ATA antibodies.  Thyroglobulin continues to decrease, and ATA antibodies are undetectable.  Will recheck this at next visit. -Plan to get another whole-body scan now, 1 year after the previous - I will see her back in 6 months  2.  Patient with h/o total thyroidectomy for cancer, now with iatrogenic hypothyroidism, on levothyroxine  therapy - latest thyroid  labs reviewed with pt. >> TSH was suppressed: Lab Results  Component Value Date   TSH 0.16 (L) 01/11/2024  - she continues on LT4 150 mcg daily, reduced after the above results returned - pt feels good on this dose. - we discussed about taking the thyroid  hormone every day, with water , >30 minutes before breakfast, separated by >4 hours from acid reflux medications, calcium , iron , multivitamins. Pt. is taking it correctly. - will check thyroid  tests today: TSH and fT4 - If labs are abnormal, she will need to return for repeat TFTs in 1.5 months   Needs refills after the new labs.  Lela Fendt, MD PhD [facility] Endocrinology

## 2024-04-19 DIAGNOSIS — Z992 Dependence on renal dialysis: Secondary | ICD-10-CM | POA: Diagnosis not present

## 2024-04-19 DIAGNOSIS — N2581 Secondary hyperparathyroidism of renal origin: Secondary | ICD-10-CM | POA: Diagnosis not present

## 2024-04-19 DIAGNOSIS — N186 End stage renal disease: Secondary | ICD-10-CM | POA: Diagnosis not present

## 2024-04-20 DIAGNOSIS — N186 End stage renal disease: Secondary | ICD-10-CM | POA: Diagnosis not present

## 2024-04-20 DIAGNOSIS — N2581 Secondary hyperparathyroidism of renal origin: Secondary | ICD-10-CM | POA: Diagnosis not present

## 2024-04-20 DIAGNOSIS — Z992 Dependence on renal dialysis: Secondary | ICD-10-CM | POA: Diagnosis not present

## 2024-04-21 ENCOUNTER — Other Ambulatory Visit: Payer: Self-pay | Admitting: Primary Care

## 2024-04-21 DIAGNOSIS — M545 Low back pain, unspecified: Secondary | ICD-10-CM

## 2024-04-21 DIAGNOSIS — N2581 Secondary hyperparathyroidism of renal origin: Secondary | ICD-10-CM | POA: Diagnosis not present

## 2024-04-21 DIAGNOSIS — N186 End stage renal disease: Secondary | ICD-10-CM | POA: Diagnosis not present

## 2024-04-21 DIAGNOSIS — Z992 Dependence on renal dialysis: Secondary | ICD-10-CM | POA: Diagnosis not present

## 2024-04-22 DIAGNOSIS — N186 End stage renal disease: Secondary | ICD-10-CM | POA: Diagnosis not present

## 2024-04-22 DIAGNOSIS — N2581 Secondary hyperparathyroidism of renal origin: Secondary | ICD-10-CM | POA: Diagnosis not present

## 2024-04-22 DIAGNOSIS — Z992 Dependence on renal dialysis: Secondary | ICD-10-CM | POA: Diagnosis not present

## 2024-04-23 DIAGNOSIS — M5440 Lumbago with sciatica, unspecified side: Secondary | ICD-10-CM | POA: Diagnosis not present

## 2024-04-23 DIAGNOSIS — N186 End stage renal disease: Secondary | ICD-10-CM | POA: Diagnosis not present

## 2024-04-23 DIAGNOSIS — N2581 Secondary hyperparathyroidism of renal origin: Secondary | ICD-10-CM | POA: Diagnosis not present

## 2024-04-23 DIAGNOSIS — Z6828 Body mass index (BMI) 28.0-28.9, adult: Secondary | ICD-10-CM | POA: Diagnosis not present

## 2024-04-23 DIAGNOSIS — Z992 Dependence on renal dialysis: Secondary | ICD-10-CM | POA: Diagnosis not present

## 2024-04-23 DIAGNOSIS — I739 Peripheral vascular disease, unspecified: Secondary | ICD-10-CM | POA: Diagnosis not present

## 2024-04-24 DIAGNOSIS — Z992 Dependence on renal dialysis: Secondary | ICD-10-CM | POA: Diagnosis not present

## 2024-04-24 DIAGNOSIS — N186 End stage renal disease: Secondary | ICD-10-CM | POA: Diagnosis not present

## 2024-04-24 DIAGNOSIS — N2581 Secondary hyperparathyroidism of renal origin: Secondary | ICD-10-CM | POA: Diagnosis not present

## 2024-04-25 DIAGNOSIS — N186 End stage renal disease: Secondary | ICD-10-CM | POA: Diagnosis not present

## 2024-04-25 DIAGNOSIS — N2581 Secondary hyperparathyroidism of renal origin: Secondary | ICD-10-CM | POA: Diagnosis not present

## 2024-04-25 DIAGNOSIS — Z992 Dependence on renal dialysis: Secondary | ICD-10-CM | POA: Diagnosis not present

## 2024-04-26 DIAGNOSIS — N186 End stage renal disease: Secondary | ICD-10-CM | POA: Diagnosis not present

## 2024-04-26 DIAGNOSIS — N2581 Secondary hyperparathyroidism of renal origin: Secondary | ICD-10-CM | POA: Diagnosis not present

## 2024-04-26 DIAGNOSIS — Z992 Dependence on renal dialysis: Secondary | ICD-10-CM | POA: Diagnosis not present

## 2024-04-27 DIAGNOSIS — N186 End stage renal disease: Secondary | ICD-10-CM | POA: Diagnosis not present

## 2024-04-27 DIAGNOSIS — N2581 Secondary hyperparathyroidism of renal origin: Secondary | ICD-10-CM | POA: Diagnosis not present

## 2024-04-27 DIAGNOSIS — Z992 Dependence on renal dialysis: Secondary | ICD-10-CM | POA: Diagnosis not present

## 2024-04-28 DIAGNOSIS — N2581 Secondary hyperparathyroidism of renal origin: Secondary | ICD-10-CM | POA: Diagnosis not present

## 2024-04-28 DIAGNOSIS — N186 End stage renal disease: Secondary | ICD-10-CM | POA: Diagnosis not present

## 2024-04-28 DIAGNOSIS — Z992 Dependence on renal dialysis: Secondary | ICD-10-CM | POA: Diagnosis not present

## 2024-04-29 DIAGNOSIS — N186 End stage renal disease: Secondary | ICD-10-CM | POA: Diagnosis not present

## 2024-04-29 DIAGNOSIS — Z992 Dependence on renal dialysis: Secondary | ICD-10-CM | POA: Diagnosis not present

## 2024-04-29 DIAGNOSIS — N2581 Secondary hyperparathyroidism of renal origin: Secondary | ICD-10-CM | POA: Diagnosis not present

## 2024-04-30 ENCOUNTER — Telehealth: Payer: Self-pay

## 2024-04-30 DIAGNOSIS — N186 End stage renal disease: Secondary | ICD-10-CM | POA: Diagnosis not present

## 2024-04-30 DIAGNOSIS — Z992 Dependence on renal dialysis: Secondary | ICD-10-CM | POA: Diagnosis not present

## 2024-04-30 DIAGNOSIS — N2581 Secondary hyperparathyroidism of renal origin: Secondary | ICD-10-CM | POA: Diagnosis not present

## 2024-05-01 DIAGNOSIS — Z992 Dependence on renal dialysis: Secondary | ICD-10-CM | POA: Diagnosis not present

## 2024-05-01 DIAGNOSIS — N2581 Secondary hyperparathyroidism of renal origin: Secondary | ICD-10-CM | POA: Diagnosis not present

## 2024-05-01 DIAGNOSIS — N186 End stage renal disease: Secondary | ICD-10-CM | POA: Diagnosis not present

## 2024-05-02 ENCOUNTER — Encounter: Payer: Self-pay | Admitting: Internal Medicine

## 2024-05-02 ENCOUNTER — Ambulatory Visit (INDEPENDENT_AMBULATORY_CARE_PROVIDER_SITE_OTHER): Admitting: Internal Medicine

## 2024-05-02 VITALS — BP 120/80 | HR 98 | Ht 65.0 in | Wt 167.4 lb

## 2024-05-02 DIAGNOSIS — C73 Malignant neoplasm of thyroid gland: Secondary | ICD-10-CM

## 2024-05-02 DIAGNOSIS — N186 End stage renal disease: Secondary | ICD-10-CM | POA: Diagnosis not present

## 2024-05-02 DIAGNOSIS — Z992 Dependence on renal dialysis: Secondary | ICD-10-CM | POA: Diagnosis not present

## 2024-05-02 DIAGNOSIS — E890 Postprocedural hypothyroidism: Secondary | ICD-10-CM | POA: Diagnosis not present

## 2024-05-02 DIAGNOSIS — N2581 Secondary hyperparathyroidism of renal origin: Secondary | ICD-10-CM | POA: Diagnosis not present

## 2024-05-02 NOTE — Progress Notes (Addendum)
 Patient ID: [name], female   DOB: 1966/08/12, 58 y.o.   MRN: [id number] This note was precharted 04/18/2024.  HPI  [name] is a 58 y.o.-year-old female, returning for follow-up for thyroid  cancer and uncontrolled postsurgical hypothyroidism.  She previously saw Dr. Kassie, last visit 10/2021.  Last visit with me 1 year and 4 months ago (video).  Interim history: No palpitations, heat intolerance or anxiety. She was diagnosed with laryngopharyngeal reflux and is on PPIs. She is on peritoneal dialysis. 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  I reviewed her thyroid  cancer hx. -Per Dr. Laymond note and review of the chart: 2007 thyr NM scan consistent with Graves disease, except for 1 cold area 2008 Dominant 2.5 cm solid nodule in the inferior right lobe, corresponding to cold defect on NM. 2008 thyroid  bx was suspicious.  2009 thyroidectomy (right lobectomy) --pathol c/w Grave's dz, with adenomatous nodule.  2/18 CT Multiple enlarged bilateral cervical lymph nodes. Residual 10 mm nodule at the thyroidectomy bed with similar nodules superior to the thyroid  on the left, concerning for residual or recurrent thyroid  tissue. Neoplasm is not excluded.  Single low-density noted in the posterior left level 2 station of undetermined significance. 3/18 LYMPH NODE , FNA, LEFT CERVICAL, STRONGLY FAVORS METASTATIC PTC: LYMPH NODE , FINE NEEDLE ASPIRATION LEFT CERVICAL NECK (SPECIMEN 2 OF 2 COLLECTED 01/12/2017) METASTATIC CARCINOMA. SEE COMMENT. COMMENT: THE FINDINGS STRONGLY FAVOR METASTATIC PAPILLARY THYROID  CARCINOMA. DR. LAMAR HILLARD HAS REVIEWED THE CASE AND CONCURS WITH THIS INTERPRETATION. DR. CARLIE WAS PAGED ON 01/13/2017. Specimen Clinical Information Left cervical lymph node, Patient status post thyroidectomy 2008 Source Lymph Node, Fine Needle Aspiration, Left Cervical, (Specimen 2 of 2, collected on 01/12/17 ) 3/18 THYROID , FINE  NEEDLE ASPIRATION, LEFT BETHESDA CATEGORY II: THYROID , FINE NEEDLE ASPIRATION, LEFT (SPECIMEN 1 OF 2 COLLECTED 01/12/2017) CONSISTENT WITH BENIGN FOLLICULAR TISSUE (BETHESDA CATEGORY II). Specimen Clinical Information Nodule within bed of left thyroid , Post thyroidectomy 2008 Source Thyroid , Fine Needle Aspiration, Left, (Specimen 1 of 2, collected on 01/12/17 ) 5/18 NM scan: Expected tissue within the thyroid  bed. There is a second adjacent smaller focus of less intense uptake.   6/18 left thyroid  lobectomy (completion thyroidectomy), and LN's, Left zone 3 Neck Contents.  NO MALIGNANCY.  Dr. Vaughan CARLIE: 1. Thyroid , lobectomy, Left - NODULAR HYPERPLASIA. - NO MALIGNANCY IDENTIFIED. 2. Lymph nodes, regional resection, Left zone 3 Neck Contents - BENIGN THYROID  WITH NODULAR HYPERPLASIA. - NO LYMPH NODE TISSUE IDENTIFIED. - NO MALIGNANCY IDENTIFIED. 8/18 NM scan: abnormal focus in the right side of neck, suspicious for nodal metastasis.   9/18 US : 1 non pathologically enlarged lymph node.  10/21 US : normal (thyroidect) 9/22 US : 1. Surgical changes of total thyroidectomy without evidence of residual or recurrent thyroid  tissue. 2. Bilateral prominent and borderline enlarged cervical chain lymph nodes. The node previously identified on the left remains unchanged at 0.4 cm in short axis. However, at least 1 additional new and slightly enlarged lymph node is now identified on both the left and the right. Differential considerations include reactive adenopathy, or less likely an early lymphoproliferative process, or metastatic disease. Reactive adenopathy is favored. However, continued clinical surveillance is recommended. If there is biochemical evidence of recurrent thyroid  cancer, or if the lymph nodes demonstrate enlargement over time, repeat imaging and possibly tissue sampling may become warranted. 01/06/2022: RAI treatment with 108.2 mCi I-131 01/13/2022: Posttreatment whole-body scan:  Focus of abnormal tracer in the right superior cervical region likely a  lymph node metastasis  Lab Results  Component Value Date   THYROGLB 0.6 (L) 08/23/2022   THYROGLB 2.2 (L) 03/01/2022   Lab Results  Component Value Date   THGAB <1 08/23/2022   THGAB <1 03/01/2022   Pt denies: - feeling nodules in neck - hoarseness - choking But does have dysphagia/reflux.  Seeing GI.  Postsurgical hypothyroidism -In 02/2022, she misunderstood instructions and she was on LT4 125 mcg (not taking 2x at a time as recommended!). At that time, I advised her to increase the dose to 175 mcg daily -In 08/2022, she again misunderstood instructions and she is actually taking 2x 175 mcg LT4 daily!!! Moreover, she did not return for labs in 5 to 6 weeks as advised so she was on this very high dose of LT4 for 4 months!  I advised her to stop the levothyroxine  for 2 days and then resume 175 mcg daily. -She is was on 175 mcg daily >> decreased to 150 mcg daily in 12/2023 - she did not return for labs...  She takes levothyroxine  - daily, no missed doses - fasting - with water  - separated by 30 min-1h from b'fast  - no calcium , iron  - prev. multivitamins 30 min to 1h after LT4 >> moved ~3h later >> stopped - + Omeprazole  at bedtime but sometimes earlier  I reviewed pt's thyroid  tests: Lab Results  Component Value Date   TSH 0.16 (L) 01/11/2024   TSH 2.61 08/23/2022   TSH 29.49 (H) 03/01/2022   TSH 54.58 (H) 11/02/2021   TSH 23.96 (H) 07/01/2021   TSH 20.04 (H) 04/30/2021   TSH 6.46 (H) 07/21/2020   TSH 17.400 (H) 01/27/2020   TSH 17.98 (H) 07/11/2019   TSH 34.55 (H) 06/05/2019   FREET4 1.49 08/23/2022   FREET4 0.59 (L) 03/01/2022   FREET4 0.55 (L) 11/02/2021   FREET4 0.75 07/01/2021   FREET4 1.0 04/30/2021   FREET4 1.05 07/21/2020   FREET4 0.94 03/08/2018   FREET4 1.03 10/12/2017   FREET4 1.56 09/01/2017   FREET4 0.87 (L) 06/05/2007    She previously mentioned: - fatigue - weight gain -  cold intolerance - no constipation - dry skin - no hair loss  She has + FH of thyroid  disorders in: sister. No FH of thyroid  cancer.  No h/o radiation tx to head or neck besides RAI treatment.  She also has a history of HIV, BrCA - s/p RxTx, DM2, HL, sarcoidosis.  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  Genes Analyzed (43 total): APC*, ATM*, AXIN2, BARD1, BMPR1A, BRCA1*, BRCA2*, BRIP1*, CDH1*, CDK4, CDKN2A, CHEK2*, DICER1, GALNT12, HOXB13, MEN1, MLH1*, MRE11A, MSH2*, MSH3, MSH6*, MUTYH*, NBN, NF1*, NTHL1, PALB2*, PMS2*, POLD1, POLE, PTEN*, RAD50, RAD51C*, RAD51D*, RET, SDHB, SDHD, SMAD4, SMARCA4, STK11 and TP53* (sequencing and deletion/duplication); EGFR (s   GERD 07/24/2006   GRAVE'S DISEASE 01/01/2008   History of hidradenitis suppurativa    History of kidney stones     History of thrush    HIV DISEASE 07/24/2006   dx March 05   Hyperlipidemia    HYPERTENSION 07/24/2006   Hyperthyroidism 08/2006   Grave's Disease -diffuse radiotracer uptake 08/25/06 Thyroid  scan-Cold nodule to R lower lobe of thyrorid   Ileus (HCC) 02/14/2023   Left bundle branch block (LBBB)    Malnutrition of moderate degree (HCC) 02/08/2023   Menometrorrhagia    hx of   Nephrolithiasis    Panniculitis 05/12/2020   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   Peripheral vascular disease (HCC)    Personal history of chemotherapy    2020   Personal history of radiation therapy    2020   Pneumonia 2005   Port-A-Cath in place 07/12/2018   Postsurgical hypothyroidism 03/20/2011   Rash 02/16/2012   Recurrent boils 06/11/2014   Recurrent falls 11/05/2020   Renal calculi 10/29/2014   Sarcoidosis 02/08/2007   dx as a teenager in [place] from abnl CXR. Completed 2 yrs Prednisone  after lung bx confirmation. No symptoms since then.   Sebaceous cyst 04/21/2020   Suppurative hidradenitis    Thyroid  cancer (HCC)    THYROID  NODULE, RIGHT 02/08/2007   Past Surgical History:  Procedure Laterality Date   AORTIC ARCH DEBRANCHING N/A 08/25/2023   Procedure: AORTIC ARCH DEBRANCHING USING 12X8X8MM HEMASHIELD GRAFT;  Surgeon: Lucas Dorise POUR, MD;  Location: [facility];  Service: Open Heart Surgery;  Laterality: N/A;  with bypasses to the innominate and left common carotid arteries   APPLICATION OF WOUND VAC N/A 01/20/2021   Procedure: APPLICATION OF WOUND VAC;  Surgeon: Lowery Estefana RAMAN, DO;  Location: [facility];  Service: Plastics;  Laterality: N/A;   BREAST EXCISIONAL BIOPSY Right 04/26/2018   right axilla negative   BREAST EXCISIONAL BIOPSY Left 04/26/2018   left axilla negative   BREAST  LUMPECTOMY Right 10/03/2018   malignant   BREAST LUMPECTOMY WITH RADIOACTIVE SEED AND SENTINEL LYMPH NODE BIOPSY Right 10/03/2018   Procedure: RIGHT BREAST LUMPECTOMY WITH RADIOACTIVE SEED AND SENTINEL LYMPH NODE MAPPING;  Surgeon: Vanderbilt Ned, MD;  Location: [facility];  Service: General;  Laterality: Right;   BREAST SURGERY  1997   Breast Reduction    CAPD INSERTION N/A 12/21/2023   Procedure: LAPAROSCOPIC INSERTION CONTINUOUS AMBULATORY PERITONEAL DIALYSIS  (CAPD) CATHETER; LAPAROSCOPIC OMENTUMPEXY;  Surgeon: Magda Ned SAILOR, MD;  Location: [facility];  Service: Vascular;  Laterality: N/A;   CYSTOSCOPY W/ URETERAL STENT REMOVAL  11/09/2012   Procedure: CYSTOSCOPY WITH STENT REMOVAL;  Surgeon: Ricardo Likens, MD;  Location: [facility];  Service: Urology;  Laterality: Right;   CYSTOSCOPY WITH RETROGRADE PYELOGRAM, URETEROSCOPY AND STENT PLACEMENT  11/09/2012   Procedure: CYSTOSCOPY WITH RETROGRADE PYELOGRAM, URETEROSCOPY AND STENT PLACEMENT;  Surgeon:  Ricardo Likens, MD;  Location: [facility];  Service: Urology;  Laterality: Left;  LEFT URETEROSCOPY, STONE MANIPULATION, left STENT exchange    CYSTOSCOPY WITH STENT PLACEMENT  10/02/2012   Procedure: CYSTOSCOPY WITH STENT PLACEMENT;  Surgeon: Ricardo Likens, MD;  Location: [facility];  Service: Urology;  Laterality: Left;   DEBRIDEMENT AND CLOSURE WOUND N/A 01/20/2021   Procedure: Excision of abdominal wound with closure;  Surgeon: Lowery Estefana RAMAN, DO;  Location: [facility];  Service: Plastics;  Laterality: N/A;   DILATION AND CURETTAGE OF UTERUS  11/2002   s/p for 1st trimester nonviable pregnancy   EYE SURGERY     sty under eyelid   INCISE AND DRAIN ABCESS  08/2002   s/p I &D for righ inframmary fold hidradenitis   INCISION AND DRAINAGE PERITONSILLAR ABSCESS  12/2001   IR CV LINE INJECTION  06/07/2018   IR FLUORO GUIDE CV LINE RIGHT  01/30/2023   IR IMAGING GUIDED PORT INSERTION  06/20/2018   IR REMOVAL TUN ACCESS W/ PORT W/O FL MOD SED   06/20/2018   IR US  GUIDE VASC ACCESS RIGHT  01/30/2023   IRRIGATION AND DEBRIDEMENT ABSCESS  01/31/2012   Procedure: IRRIGATION AND DEBRIDEMENT ABSCESS;  Surgeon: Alm VEAR Angle, MD;  Location: [facility];  Service: General;  Laterality: Right;  right breast and axilla    LAPAROSCOPIC REPOSITIONING CAPD CATHETER Left 01/10/2024   Procedure: Removal of peritoneal dialysis Catheter.;  Surgeon: Magda Debby SAILOR, MD;  Location: [facility] OR;  Service: Vascular;  Laterality: Left;   LAPAROSCOPY N/A 01/10/2024   Procedure: LAPAROSCOPY, DIAGNOSTIC;  Surgeon: Magda Debby SAILOR, MD;  Location: [facility] OR;  Service: Vascular;  Laterality: N/A;   NEPHROLITHOTOMY  10/02/2012   Procedure: NEPHROLITHOTOMY PERCUTANEOUS;  Surgeon: Ricardo Likens, MD;  Location: [facility];  Service: Urology;  Laterality: Right;  First Stage Percutaneous Nephrolithotomy with Surgeon Access, Left Ureteral Stent     NEPHROLITHOTOMY  10/04/2012   Procedure: NEPHROLITHOTOMY PERCUTANEOUS SECOND LOOK;  Surgeon: Ricardo Likens, MD;  Location: [facility];  Service: Urology;  Laterality: Right;      NEPHROLITHOTOMY  10/08/2012   Procedure: NEPHROLITHOTOMY PERCUTANEOUS;  Surgeon: Ricardo Likens, MD;  Location: [facility];  Service: Urology;  Laterality: Right;  THIRD STAGE, nephrostomy tube exchange x 2   NEPHROLITHOTOMY  10/11/2012   Procedure: NEPHROLITHOTOMY PERCUTANEOUS SECOND LOOK;  Surgeon: Ricardo Likens, MD;  Location: [facility];  Service: Urology;  Laterality: Right;  RIGHT 4 STAGE PERCUTANOUS NEPHROLITHOTOMY, right URETEROSCOPY WITH HOLMIUM LASER    PANNICULECTOMY N/A 12/21/2020   Procedure: PANNICULECTOMY;  Surgeon: Lowery Estefana RAMAN, DO;  Location: [facility];  Service: Plastics;  Laterality: N/A;   PERCUTANEOUS NEPHROSTOLITHOTOMY  04/2022   PORT-A-CATH REMOVAL N/A 07/16/2020   Procedure: REMOVAL PORT-A-CATH;  Surgeon: Vanderbilt Debby, MD;  Location: [facility];  Service: General;  Laterality: N/A;   PORTACATH PLACEMENT Left 05/17/2018    Procedure: INSERTION PORT-A-CATH;  Surgeon: Vernetta Berg, MD;  Location: [facility];  Service: General;  Laterality: Left;   RADICAL NECK DISSECTION  04/12/2017   limited/notes 04/12/2017   RADICAL NECK DISSECTION N/A 04/12/2017   Procedure: RADICAL NECK DISSECTION;  Surgeon: Carlie Clark, MD;  [facility] OR;  Service: ENT;  Laterality: N/A;  limited neck dissection 2 hours total   REDUCTION MAMMAPLASTY Bilateral 1998   RIGHT/LEFT HEART CATH AND CORONARY ANGIOGRAPHY N/A 03/12/2020   Procedure: RIGHT/LEFT HEART CATH AND CORONARY ANGIOGRAPHY;  Surgeon: Cherrie Toribio SAUNDERS, MD;  [facility];  Service: Cardiovascular;  Laterality: N/A;   [place]  1994   TEE WITHOUT CARDIOVERSION N/A 08/25/2023   Procedure: TRANSESOPHAGEAL ECHOCARDIOGRAM;  Surgeon: Lucas Dorise POUR, MD;  Location: [facility];  Service: Open Heart Surgery;  Laterality: N/A;   TENCKHOFF CATHETER INSERTION Left 01/10/2024   Procedure: INSERTION, CATHETER, DIALYSIS, PERITONEAL;  Surgeon: Magda Debby SAILOR, MD;  Location: [facility];  Service: Vascular;  Laterality: Left;   THORACIC AORTIC ENDOVASCULAR STENT GRAFT N/A 02/02/2023   Procedure: THORACIC AORTIC ENDOVASCULAR STENT GRAFT;  Surgeon: Lanis Fonda BRAVO, MD;  Location: [facility] OR;  Service: Vascular;  Laterality: N/A;   THORACIC AORTIC ENDOVASCULAR STENT GRAFT N/A 08/25/2023   Procedure: THORACIC AORTIC ENDOVASCULAR STENT GRAFT;  Surgeon: Lanis Fonda BRAVO, MD;  Location: [facility] OR;  Service: Vascular;  Laterality: N/A;   THYROIDECTOMY  04/12/2017   completion/notes 04/12/2017   THYROIDECTOMY N/A 04/12/2017   Procedure: THYROIDECTOMY;  Surgeon: Carlie Clark, MD;  Location: [facility] OR;  Service: ENT;  Laterality: N/A;  Completion Thyroidectomy   TOTAL THYROIDECTOMY  2010   ULTRASOUND GUIDANCE FOR VASCULAR ACCESS Right 08/25/2023   Procedure: ULTRASOUND GUIDANCE FOR VASCULAR ACCESS, RIGHT FEMORAL ARTERY;  Surgeon: Lanis Fonda BRAVO, MD;  Location: [facility] OR;  Service: Vascular;   Laterality: Right;   Social History   Socioeconomic History   Marital status: Single    Spouse name: Not on file   Number of children: 1   Years of education: Not on file   Highest education level: Not on file  Occupational History   Occupation: Investment banker, corporate  Tobacco Use   Smoking status: Former    Current packs/day: 0.00    Average packs/day: 0.5 packs/day for 15.0 years (7.5 ttl pk-yrs)    Types: Cigarettes    Start date: 04/12/2017    Quit date: 2019    Years since quitting: 6.5   Smokeless tobacco: Never  Vaping Use   Vaping status: Never Used  Substance and Sexual Activity   Alcohol  use: Not Currently    Comment: occasional wine   Drug use: No   Sexual activity: Not Currently    Birth control/protection: Post-menopausal  Other Topics Concern   Not on file  Social History Narrative   Right Handed    Lives in a two story home   Social Drivers of Health   Financial Resource Strain: High Risk (01/08/2024)   Overall Financial Resource Strain (CARDIA)    Difficulty of Paying Living Expenses: Hard  Food Insecurity: No Food Insecurity (02/22/2024)   Hunger Vital Sign    Worried About Running Out of Food in the Last Year: Never true    Ran Out of Food in the Last Year: Never true  Recent Concern: Food Insecurity - Food Insecurity Present (01/08/2024)   Hunger Vital Sign    Worried About Radiation protection practitioner of Food in the Last Year: Sometimes true    Ran Out of Food in the Last Year: Sometimes true  Transportation Needs: No Transportation Needs (02/22/2024)   PRAPARE - Administrator, Civil Service (Medical): No    Lack of Transportation (Non-Medical): No  Physical Activity: Not on file  Stress: Not on file  Social Connections: Not on file  Intimate Partner Violence: Not At Risk (02/22/2024)   Humiliation, Afraid, Rape, and Kick questionnaire    Fear of Current or Ex-Partner: No    Emotionally Abused: No    Physically Abused: No    Sexually Abused: No   Current  Outpatient Medications on File  Prior to Visit  Medication Sig Dispense Refill   acetaminophen  (TYLENOL ) 500 MG tablet Take 1,000 mg by mouth every 6 (six) hours as needed for mild pain.     albuterol  (VENTOLIN  HFA) 108 (90 Base) MCG/ACT inhaler INHALE 2 PUFFS INTO THE LUNGS UP TO EVERY 6HRS AS NEEDED FOR WHEEZING/SHORTNESS OF BREATH 8 each 0   allopurinol  (ZYLOPRIM ) 100 MG tablet TAKE 1 TABLET (100 MG TOTAL) BY MOUTH DAILY. FOR GOUT PREVENTION 90 tablet 2   [Paused] amLODipine  (NORVASC ) 10 MG tablet Take 10 mg by mouth daily.     aspirin  EC 81 MG tablet Take 81 mg by mouth daily.     AURYXIA  1 GM 210 MG(Fe) tablet Take 210 mg by mouth 3 (three) times daily with meals.     Blood Glucose Monitoring Suppl (FREESTYLE LITE) w/Device KIT Inject 1 each into the skin daily. 1 kit 0   carvedilol  (COREG ) 12.5 MG tablet Take 12.5 mg by mouth 2 (two) times daily.     cyclobenzaprine  (FLEXERIL ) 10 MG tablet Take 1 tablet (10 mg total) by mouth 3 (three) times daily as needed for muscle spasms. 30 tablet 0   dolutegravir -lamiVUDine (DOVATO ) 50-300 MG tablet Take 1 tablet by mouth daily. 30 tablet 11   fluticasone  (FLOVENT  HFA) 110 MCG/ACT inhaler Inhale 1 puff into the lungs 2 (two) times daily.     Lancets (ONETOUCH ULTRASOFT) lancets Use as instructed to test blood sugar daily 100 each 5   levocetirizine (XYZAL ) 5 MG tablet Take 1 tablet (5 mg total) by mouth every evening. For allergies 90 tablet 3   lidocaine  (LIDODERM ) 5 % PLACE 1 PATCH ONTO THE SKIN DAILY. REMOVE & DISCARD PATCH WITHIN 12 HOURS OR AS DIRECTED BY MD 30 patch 0   [Paused] losartan  (COZAAR ) 50 MG tablet Take 50 mg by mouth daily.     minocycline  (MINOCIN ) 100 MG capsule Take 1 capsule (100 mg total) by mouth 2 (two) times daily. 60 capsule 5   omeprazole  (PRILOSEC) 40 MG capsule Take 1 capsule (40 mg total) by mouth daily. for heartburn. 90 capsule 2   predniSONE  (DELTASONE ) 20 MG tablet Take 3 tablets by mouth every morning x 3 days, then 2  tablets x 3 days, then 1 tablet x 3 days. 18 tablet 0   rosuvastatin  (CRESTOR ) 10 MG tablet TAKE 1 TABLET BY MOUTH EVERY DAY FOR CHOLESTEROL (Patient not taking: Reported on 04/03/2024) 90 tablet 2   saxagliptin  HCl (ONGLYZA) 2.5 MG TABS tablet Take 1 tablet (2.5 mg total) by mouth daily. for diabetes. 90 tablet 1   SYNTHROID  150 MCG tablet TAKE 1 TABLET BY MOUTH DAILY BEFORE BREAKFAST. 90 tablet 1   traMADol  (ULTRAM ) 50 MG tablet Take 1 tablet (50 mg total) by mouth every 12 (twelve) hours as needed. 15 tablet 0   No current facility-administered medications on file prior to visit.   Allergies  Allergen Reactions   Genvoya [Elviteg-Cobic-Emtricit-Tenofaf] Hives   Lisinopril Cough   Aldactone  [Spironolactone ] Hives   Tegaderm Ag Mesh [Silver] Itching   Family History  Problem Relation Age of Onset   Hypertension Mother    Cancer Mother        laryngeal   Heart disease Mother        stent   Pancreatic cancer Father    Hypertension Father    Lung cancer Father 11       hx smoking   Hypertension Sister    Hypertension Sister    Hypertension Brother  Breast cancer Maternal Aunt 25   Breast cancer Maternal Aunt        dx 60+   Cancer Maternal Uncle        Lung CA   Breast cancer Paternal Aunt 70   Breast cancer Paternal Aunt        dx 29's   Breast cancer Paternal Aunt        dx 31's   Prostate cancer Paternal Uncle    Prostate cancer Paternal Uncle    Lung cancer Paternal Uncle    Breast cancer Cousin 58   Breast cancer Cousin        dx <50   Breast cancer Cousin        dx <50   Breast cancer Cousin        dx <50   Heart disease Other    Hypertension Other    Stroke Other        Grandparent   Kidney disease Other        Grandparent   Diabetes Other        FH of Diabetes   Colon cancer Neg Hx    Esophageal cancer Neg Hx    Rectal cancer Neg Hx    Stomach cancer Neg Hx    PE: BP 120/80   Pulse 98   Ht 5' 5 (1.651 m)   Wt 167 lb 6.4 oz (75.9 kg)   LMP  03/31/2014 (LMP Unknown)   SpO2 99%   BMI 27.86 kg/m  Wt Readings from Last 3 Encounters:  05/02/24 167 lb 6.4 oz (75.9 kg)  03/29/24 168 lb (76.2 kg)  03/20/24 172 lb (78 kg)   Constitutional: overweight, in NAD Eyes:  EOMI, no exophthalmos ENT: no neck masses, no cervical lymphadenopathy Cardiovascular: Tachycardia, RR, No MRG Respiratory: CTA B Musculoskeletal: no deformities Skin:no rashes Neurological: no tremor with outstretched hands  ASSESSMENT: 1. Thyroid  cancer - see HPI  2. Postsurgical Hypothyroidism -Uncontrolled  3. DM2  PLAN:  1. Thyroid  cancer - papillary -Patient with a interesting long history of metastatic papillary thyroid  cancer, detected in cervical nodes, without thyroid  primary tumor found.  There is no evidence of a thyroglossal duct cyst or ectopic thyroid  tissue on imaging. - she initially had right lobectomy in 2009 which was negative for thyroid  cancer.  After having had a lymph node biopsy showing PTC metastasis in the left lateral neck, she had a completion thyroidectomy by Dr. Carlie in 2018.  Very interestingly, the final pathology showed no malignancy.  -She had RAI treatment and a posttreatment whole-body scan showed a focus of abnormal tracer in the right superior cervical neck, consistent with a lymph node metastasis.  We discussed that the RAI treatment may have ablated this focus.   - At last visit, we checked her thyroglobulin and ATA antibodies.  Thyroglobulin level continued to decrease and ATA antibodies were undetectable.  Will recheck these now. - Plan to get another whole-body scan now, 2 years from the previous - I will see her back in 6 months   2.  Patient with h/o total thyroidectomy for cancer, now with iatrogenic hypothyroidism, on levothyroxine  therapy - latest thyroid  labs reviewed with pt. >> TSH was suppressed: Lab Results  Component Value Date   TSH 0.16 (L) 01/11/2024  - she continues on LT4 150 mcg daily, decreased  after the above results returned - pt feels good on this dose. - we discussed about taking the thyroid  hormone every day, with water , >30  minutes before breakfast, separated by >4 hours from acid reflux medications, calcium , iron , multivitamins. Pt. is taking it correctly, however, she takes the PPIs as needed during the day and it is unclear if she has been taking some of the doses closer to levothyroxine .  I advised her to always take this at least 4 hours after that the thyroid  medication.  She also had prednisone  a month ago after approximately 3 weeks ago. - will check thyroid  tests today: TSH and fT4 - If labs are abnormal, she will need to return for repeat TFTs in 1.5 months  Needs refills after the new labs.  Orders Placed This Encounter  Procedures   NM Whole Body I131 Scan W/Thyrogen    TSH   T4, free   Thyroglobulin Level   Thyroglobulin antibody   Component     Latest Ref Rng 05/02/2024  TSH     mIU/L 1.21   T4,Free(Direct)     0.8 - 1.8 ng/dL 1.5   Thyroglobulin     ng/mL 1.1 (L)   Comment -   Thyroglobulin Ab     < or = 1 IU/mL <1   Tg is slightly higher.  The level should not be affected by her peritoneal dialysis.  ATA's are undetectable.  Her Thyrogen  stimulated whole-body scan is pending. Her TSH is normal, but I will repeat this at next visit and may need to increase her dose of LT4 at that time.  Lela Fendt, MD PhD [facility] Endocrinology

## 2024-05-02 NOTE — Patient Instructions (Addendum)
 Please continue levothyroxine  150 mcg daily.  Take the thyroid  hormone every day, with water , at least 30 minutes before breakfast, separated by at least 4 hours from: - acid reflux medications - calcium  - iron  - multivitamins  Please stop at the lab.  We will check another whole body scan.  Please come back for a visit in 6 months.

## 2024-05-03 DIAGNOSIS — N186 End stage renal disease: Secondary | ICD-10-CM | POA: Diagnosis not present

## 2024-05-03 DIAGNOSIS — N2581 Secondary hyperparathyroidism of renal origin: Secondary | ICD-10-CM | POA: Diagnosis not present

## 2024-05-03 DIAGNOSIS — Z992 Dependence on renal dialysis: Secondary | ICD-10-CM | POA: Diagnosis not present

## 2024-05-03 LAB — THYROGLOBULIN LEVEL: Thyroglobulin: 1.1 ng/mL — ABNORMAL LOW

## 2024-05-03 LAB — THYROGLOBULIN ANTIBODY: Thyroglobulin Ab: <1 [IU]/mL (ref <=1)

## 2024-05-03 LAB — TSH: TSH: 1.21 m[IU]/L

## 2024-05-03 LAB — T4, FREE: Free T4: 1.5 ng/dL (ref 0.8–1.8)

## 2024-05-04 DIAGNOSIS — N2581 Secondary hyperparathyroidism of renal origin: Secondary | ICD-10-CM | POA: Diagnosis not present

## 2024-05-04 DIAGNOSIS — Z992 Dependence on renal dialysis: Secondary | ICD-10-CM | POA: Diagnosis not present

## 2024-05-04 DIAGNOSIS — N186 End stage renal disease: Secondary | ICD-10-CM | POA: Diagnosis not present

## 2024-05-05 DIAGNOSIS — Z992 Dependence on renal dialysis: Secondary | ICD-10-CM | POA: Diagnosis not present

## 2024-05-05 DIAGNOSIS — N2581 Secondary hyperparathyroidism of renal origin: Secondary | ICD-10-CM | POA: Diagnosis not present

## 2024-05-05 DIAGNOSIS — N186 End stage renal disease: Secondary | ICD-10-CM | POA: Diagnosis not present

## 2024-05-06 ENCOUNTER — Ambulatory Visit: Payer: Self-pay | Admitting: Internal Medicine

## 2024-05-06 DIAGNOSIS — Z992 Dependence on renal dialysis: Secondary | ICD-10-CM | POA: Diagnosis not present

## 2024-05-06 DIAGNOSIS — N2581 Secondary hyperparathyroidism of renal origin: Secondary | ICD-10-CM | POA: Diagnosis not present

## 2024-05-06 DIAGNOSIS — N186 End stage renal disease: Secondary | ICD-10-CM | POA: Diagnosis not present

## 2024-05-07 DIAGNOSIS — N2581 Secondary hyperparathyroidism of renal origin: Secondary | ICD-10-CM | POA: Diagnosis not present

## 2024-05-07 DIAGNOSIS — Z992 Dependence on renal dialysis: Secondary | ICD-10-CM | POA: Diagnosis not present

## 2024-05-07 DIAGNOSIS — N186 End stage renal disease: Secondary | ICD-10-CM | POA: Diagnosis not present

## 2024-05-08 DIAGNOSIS — N2581 Secondary hyperparathyroidism of renal origin: Secondary | ICD-10-CM | POA: Diagnosis not present

## 2024-05-08 DIAGNOSIS — N186 End stage renal disease: Secondary | ICD-10-CM | POA: Diagnosis not present

## 2024-05-08 DIAGNOSIS — Z992 Dependence on renal dialysis: Secondary | ICD-10-CM | POA: Diagnosis not present

## 2024-05-09 DIAGNOSIS — N2581 Secondary hyperparathyroidism of renal origin: Secondary | ICD-10-CM | POA: Diagnosis not present

## 2024-05-09 DIAGNOSIS — Z992 Dependence on renal dialysis: Secondary | ICD-10-CM | POA: Diagnosis not present

## 2024-05-09 DIAGNOSIS — N186 End stage renal disease: Secondary | ICD-10-CM | POA: Diagnosis not present

## 2024-05-10 DIAGNOSIS — N2581 Secondary hyperparathyroidism of renal origin: Secondary | ICD-10-CM | POA: Diagnosis not present

## 2024-05-10 DIAGNOSIS — N186 End stage renal disease: Secondary | ICD-10-CM | POA: Diagnosis not present

## 2024-05-10 DIAGNOSIS — Z992 Dependence on renal dialysis: Secondary | ICD-10-CM | POA: Diagnosis not present

## 2024-05-11 DIAGNOSIS — N186 End stage renal disease: Secondary | ICD-10-CM | POA: Diagnosis not present

## 2024-05-11 DIAGNOSIS — Z992 Dependence on renal dialysis: Secondary | ICD-10-CM | POA: Diagnosis not present

## 2024-05-11 DIAGNOSIS — N2581 Secondary hyperparathyroidism of renal origin: Secondary | ICD-10-CM | POA: Diagnosis not present

## 2024-05-12 ENCOUNTER — Other Ambulatory Visit: Payer: Self-pay | Admitting: Primary Care

## 2024-05-12 DIAGNOSIS — Z992 Dependence on renal dialysis: Secondary | ICD-10-CM | POA: Diagnosis not present

## 2024-05-12 DIAGNOSIS — N2581 Secondary hyperparathyroidism of renal origin: Secondary | ICD-10-CM | POA: Diagnosis not present

## 2024-05-12 DIAGNOSIS — J309 Allergic rhinitis, unspecified: Secondary | ICD-10-CM

## 2024-05-12 DIAGNOSIS — N186 End stage renal disease: Secondary | ICD-10-CM | POA: Diagnosis not present

## 2024-05-13 ENCOUNTER — Ambulatory Visit (INDEPENDENT_AMBULATORY_CARE_PROVIDER_SITE_OTHER): Admitting: Infectious Diseases

## 2024-05-13 ENCOUNTER — Other Ambulatory Visit: Payer: Self-pay

## 2024-05-13 ENCOUNTER — Other Ambulatory Visit (HOSPITAL_COMMUNITY)
Admission: RE | Admit: 2024-05-13 | Discharge: 2024-05-13 | Disposition: A | Discharge status: Home / Self Care | Source: Ambulatory Visit | Attending: Infectious Diseases | Admitting: Infectious Diseases

## 2024-05-13 VITALS — BP 144/84 | HR 89 | Temp 98.4°F | Ht 65.0 in | Wt 165.0 lb

## 2024-05-13 DIAGNOSIS — Z124 Encounter for screening for malignant neoplasm of cervix: Secondary | ICD-10-CM | POA: Diagnosis not present

## 2024-05-13 DIAGNOSIS — Z113 Encounter for screening for infections with a predominantly sexual mode of transmission: Secondary | ICD-10-CM | POA: Diagnosis not present

## 2024-05-13 DIAGNOSIS — Z1151 Encounter for screening for human papillomavirus (HPV): Secondary | ICD-10-CM | POA: Diagnosis not present

## 2024-05-13 DIAGNOSIS — Z992 Dependence on renal dialysis: Secondary | ICD-10-CM | POA: Diagnosis not present

## 2024-05-13 DIAGNOSIS — N186 End stage renal disease: Secondary | ICD-10-CM | POA: Diagnosis not present

## 2024-05-13 DIAGNOSIS — Z21 Asymptomatic human immunodeficiency virus [HIV] infection status: Secondary | ICD-10-CM

## 2024-05-13 DIAGNOSIS — Z01419 Encounter for gynecological examination (general) (routine) without abnormal findings: Secondary | ICD-10-CM | POA: Diagnosis present

## 2024-05-13 DIAGNOSIS — N2581 Secondary hyperparathyroidism of renal origin: Secondary | ICD-10-CM | POA: Diagnosis not present

## 2024-05-13 MED ORDER — EMTRICITABINE-TENOFOVIR AF 200-25 MG PO TABS: 1.0000 | ORAL_TABLET | Freq: Every day | ORAL | 11 refills | Status: AC

## 2024-05-13 MED ORDER — DOLUTEGRAVIR SODIUM 50 MG PO TABS: 50.0000 mg | ORAL_TABLET | Freq: Every day | ORAL | 11 refills | Status: AC

## 2024-05-13 NOTE — Progress Notes (Signed)
 Name: [name]  DOB: [birth year] MRN: [id number] PCP: Gretta Comer POUR, NP    Brief Narrative:  [name] is a 58 y.o. female with well controlled HIV, stage 2 at diagnosis, Dx in 2005.  Notable PMHx includes ESRD on HD since April 2024, peritoneal HD started 2025.  HTN, Sarcoidosis, MRSA boils/HS, hypothyroidism, breast cancer 2019 CD4 nadir > 200 but specifics not detailed  VL unknown HIV Risk: sexual History of OIs: none Intake Labs: Hep B sAg (-), sAb (+), cAb (-); Hep A (+), Hep C (-) Quantiferon () HLA B*5701 (- 2019) G6PD: ()   Previous Regimens: Atripla  2008 (terrible nightmares)  Genvoya 2016 (severe itching / papular rash all over) Odefsey  2016 (chemo interaction with RPV) [name]  + Descovy  2019  Genotypes: 2022 - wildtype  Subjective:   Chief Complaint  Patient presents with   Gynecologic Exam    PAP clinic     Discussed the use of AI scribe software for clinical note transcription with the patient, who gave verbal consent to proceed.  History of Present Illness   [name] is a 58 year old female with diabetes who presents with back pain and recurrent yeast infections.  She experiences daily back pain, described as shooting pains similar to sciatica, which began after a spinal procedure where a nerve might have been affected. She also has arthritis in her back. Prednisone  has been used to manage the pain, providing some relief but not complete resolution. She has not engaged in strengthening exercises.  She has noticed fine, sandpaper-like bumps in her genital area, discovered while wiping. There is no associated pain or open lesions. She recently completed a course of Diflucan  for a yeast infection, with the last dose taken two weeks ago. She reports a history of diabetes and recent use of minocycline  and prednisone .  She has been experiencing nausea and a lack of appetite since switching to Dovato . Attempts to take  it with food have not improved her symptoms.         04/03/2024   11:08 AM  Depression screen PHQ 2/9  Decreased Interest 0  Down, Depressed, Hopeless 0  PHQ - 2 Score 0     Review of Systems  Constitutional:  Negative for chills, fever, malaise/fatigue and weight loss.  HENT:  Negative for sore throat.   Respiratory:  Negative for cough, sputum production and shortness of breath.   Cardiovascular: Negative.   Gastrointestinal:  Negative for abdominal pain, diarrhea and vomiting.  Musculoskeletal:  Negative for joint pain, myalgias and neck pain.  Skin:  Negative for rash.  Neurological:  Negative for headaches.  Psychiatric/Behavioral:  Negative for depression and substance abuse. The patient is not nervous/anxious.     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  Genes Analyzed (43  total): APC*, ATM*, AXIN2, BARD1, BMPR1A, BRCA1*, BRCA2*, BRIP1*, CDH1*, CDK4, CDKN2A, CHEK2*, DICER1, GALNT12, HOXB13, MEN1, MLH1*, MRE11A, MSH2*, MSH3, MSH6*, MUTYH*, NBN, NF1*, NTHL1, PALB2*, PMS2*, POLD1, POLE, PTEN*, RAD50, RAD51C*, RAD51D*, RET, SDHB, SDHD, SMAD4, SMARCA4, STK11 and TP53* (sequencing and deletion/duplication); EGFR (s   GERD 07/24/2006   GRAVE'S DISEASE 01/01/2008   History of hidradenitis suppurativa    History of kidney stones    History of thrush    HIV DISEASE 07/24/2006   dx March 05   Hyperlipidemia    HYPERTENSION 07/24/2006   Hyperthyroidism 08/2006   Grave's Disease -diffuse radiotracer uptake 08/25/06 Thyroid  scan-Cold nodule to R lower lobe of thyrorid   Ileus (HCC) 02/14/2023   Left bundle branch block (LBBB)    Malnutrition of moderate degree (HCC) 02/08/2023   Menometrorrhagia    hx of   Nephrolithiasis    Panniculitis 05/12/2020   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 Completed 2 yrs Prednisone  after lung bx confirmation. No symptoms since then.   Sebaceous cyst 04/21/2020   Suppurative hidradenitis    Thyroid  cancer (HCC)    THYROID  NODULE, RIGHT 02/08/2007    Outpatient Medications Prior to Visit  Medication Sig Dispense Refill   dolutegravir -lamiVUDine (DOVATO ) 50-300 MG tablet Take 1 tablet by mouth daily. 30 tablet 11   acetaminophen  (TYLENOL ) 500 MG tablet  Take 1,000 mg by mouth every 6 (six) hours as needed for mild pain.     albuterol  (VENTOLIN  HFA) 108 (90 Base) MCG/ACT inhaler INHALE 2 PUFFS INTO THE LUNGS UP TO EVERY 6HRS AS NEEDED FOR WHEEZING/SHORTNESS OF BREATH 8 each 0   allopurinol  (ZYLOPRIM ) 100 MG tablet TAKE 1 TABLET (100 MG TOTAL) BY MOUTH DAILY. FOR GOUT PREVENTION 90 tablet 2   amLODipine  (NORVASC ) 10 MG tablet Take 10 mg by mouth daily.     aspirin  EC 81 MG tablet Take 81 mg by mouth daily.     AURYXIA  1 GM 210 MG(Fe) tablet Take 210 mg by mouth 3 (three) times daily with meals.     Blood Glucose Monitoring Suppl (FREESTYLE LITE) w/Device KIT Inject 1 each into the skin daily. 1 kit 0   carvedilol  (COREG ) 12.5 MG tablet Take 12.5 mg by mouth 2 (two) times daily.     cyclobenzaprine  (FLEXERIL ) 10 MG tablet Take 1 tablet (10 mg total) by mouth 3 (three) times daily as needed for muscle spasms. 30 tablet 0  fluticasone  (FLOVENT  HFA) 110 MCG/ACT inhaler Inhale 1 puff into the lungs 2 (two) times daily.     Lancets (ONETOUCH ULTRASOFT) lancets Use as instructed to test blood sugar daily 100 each 5   levocetirizine (XYZAL ) 5 MG tablet TAKE 1 TABLET BY MOUTH EVERY DAY IN THE EVENING FOR ALLERGIES 90 tablet 1   lidocaine  (LIDODERM ) 5 % PLACE 1 PATCH ONTO THE SKIN DAILY. REMOVE & DISCARD PATCH WITHIN 12 HOURS OR AS DIRECTED BY MD 30 patch 0   losartan  (COZAAR ) 50 MG tablet Take 50 mg by mouth daily.     minocycline  (MINOCIN ) 100 MG capsule Take 1 capsule (100 mg total) by mouth 2 (two) times daily. 60 capsule 5   omeprazole  (PRILOSEC) 40 MG capsule Take 1 capsule (40 mg total) by mouth daily. for heartburn. 90 capsule 2   rosuvastatin  (CRESTOR ) 10 MG tablet TAKE 1 TABLET BY MOUTH EVERY DAY FOR CHOLESTEROL (Patient not taking: Reported on 05/02/2024) 90 tablet 2   saxagliptin  HCl (ONGLYZA) 2.5 MG TABS tablet Take 1 tablet (2.5 mg total) by mouth daily. for diabetes. 90 tablet 1   SYNTHROID  150 MCG tablet TAKE 1 TABLET BY MOUTH DAILY BEFORE  BREAKFAST. 90 tablet 1   traMADol  (ULTRAM ) 50 MG tablet Take 1 tablet (50 mg total) by mouth every 12 (twelve) hours as needed. 15 tablet 0   No facility-administered medications prior to visit.     Allergies  Allergen Reactions   Genvoya [Elviteg-Cobic-Emtricit-Tenofaf] Hives   Lisinopril Cough   Aldactone  [Spironolactone ] Hives   Tegaderm Ag Mesh [Silver] Itching    Social History   Tobacco Use   Smoking status: Former    Current packs/day: 0.00    Average packs/day: 0.5 packs/day for 15.0 years (7.5 ttl pk-yrs)    Types: Cigarettes    Start date: 04/12/2017    Quit date: 2019    Years since quitting: 6.5   Smokeless tobacco: Never  Vaping Use   Vaping status: Never Used  Substance Use Topics   Alcohol  use: Not Currently    Comment: occasional wine   Drug use: No    Social History   Substance and Sexual Activity  Sexual Activity Not Currently   Birth control/protection: Post-menopausal     Objective:   Vitals:   05/13/24 1350  BP: (!) 144/84  Pulse: 89  Temp: 98.4 F (36.9 C)  TempSrc: Temporal  SpO2: 96%  Weight: 165 lb (74.8 kg)  Height: 5' 5 (1.651 m)    Body mass index is 27.46 kg/m.  Physical Exam Genitourinary:    General: Normal vulva.     Vagina: Normal.     Cervix: Discharge present. No cervical motion tenderness, friability or erythema.     Lab Results Lab Results  Component Value Date   WBC 13.3 (H) 03/20/2024   HGB 11.0 (L) 03/20/2024   HCT 33.4 (L) 03/20/2024   MCV 102.8 (H) 03/20/2024   PLT 223 03/20/2024    Lab Results  Component Value Date   CREATININE 12.70 (H) 03/20/2024   BUN 75 (H) 03/20/2024   NA 134 (L) 03/20/2024   K 3.2 (L) 03/20/2024   CL 90 (L) 03/20/2024   CO2 23 03/20/2024    Lab Results  Component Value Date   ALT 22 03/20/2024   AST 16 03/20/2024   ALKPHOS 153 (H) 03/20/2024   BILITOT 0.3 03/20/2024    Lab Results  Component Value Date   CHOL 113 01/11/2024   HDL 44.00 01/11/2024  LDLCALC  59 01/11/2024   LDLDIRECT 171.0 06/05/2019   TRIG 53.0 01/11/2024   CHOLHDL 3 01/11/2024   HIV 1 RNA Quant (Copies/mL)  Date Value  06/08/2023 <20 (H)  12/28/2021 Not Detected  06/15/2021 <20 (H)   CD4 T Cell Abs (/uL)  Date Value  06/08/2023 635  03/17/2021 626  08/18/2020 624     Assessment & Plan:  Assessment and Plan    Recent vulvovaginal yeast infection - Recurrent yeast infections, possibly exacerbated by minocycline  and prednisone , which can increase blood glucose levels. Some normal appearing cervical fluid noted w/o vaginitis on pelvic exam. Collected for testing.  - Perform vaginal swab to identify yeast species and determine appropriate treatment. - Consider boric acid suppository if Candida glabrata is identified.  Nausea possibly due to Dovato  - Nausea potentially related to Dovato , ongoing for two months. Lack of appetite persists despite taking medication with food. Consideration to switch back to [name]  and Descovy  due to possible lamivudine component involvement. - Switch antiretroviral therapy back to [name]  and Descovy . - Send prescription to the same pharmacy.   Update HIV RNA and CD4 today - RTC in 51m for follow up care and medicine review      Meds ordered this encounter  Medications   dolutegravir  ([name] ) 50 MG tablet    Sig: Take 1 tablet (50 mg total) by mouth daily.    Dispense:  30 tablet    Refill:  11   emtricitabine -tenofovir  AF (DESCOVY ) 200-25 MG tablet    Sig: Take 1 tablet by mouth daily.    Dispense:  30 tablet    Refill:  11   Orders Placed This Encounter  Procedures   HIV 1 RNA quant-no reflex-bld   T-helper cells (CD4) count     Corean Fireman, MSN, NP-C [facility] Infectious [facility] Pager: [phone] Office: [phone]  05/13/24  2:36 PM

## 2024-05-14 ENCOUNTER — Other Ambulatory Visit: Payer: Self-pay | Admitting: Primary Care

## 2024-05-14 ENCOUNTER — Ambulatory Visit: Payer: Self-pay | Admitting: Infectious Diseases

## 2024-05-14 ENCOUNTER — Encounter (HOSPITAL_COMMUNITY): Payer: Self-pay

## 2024-05-14 DIAGNOSIS — Z992 Dependence on renal dialysis: Secondary | ICD-10-CM | POA: Diagnosis not present

## 2024-05-14 DIAGNOSIS — N2581 Secondary hyperparathyroidism of renal origin: Secondary | ICD-10-CM | POA: Diagnosis not present

## 2024-05-14 DIAGNOSIS — N186 End stage renal disease: Secondary | ICD-10-CM | POA: Diagnosis not present

## 2024-05-14 DIAGNOSIS — I10 Essential (primary) hypertension: Secondary | ICD-10-CM

## 2024-05-14 LAB — CERVICOVAGINAL ANCILLARY ONLY
Bacterial Vaginitis (gardnerella): NEGATIVE
Candida Glabrata: NEGATIVE
Candida Vaginitis: NEGATIVE
Chlamydia: NEGATIVE
Comment: NEGATIVE
Comment: NEGATIVE
Comment: NEGATIVE
Comment: NEGATIVE
Comment: NEGATIVE
Comment: NORMAL
Neisseria Gonorrhea: NEGATIVE
Trichomonas: NEGATIVE

## 2024-05-14 LAB — T-HELPER CELLS (CD4) COUNT (NOT AT ARMC)
CD4 % Helper T Cell: 39 % (ref 33–65)
CD4 T Cell Abs: 475 /uL (ref 400–1790)

## 2024-05-14 NOTE — Written Directive (Cosign Needed Addendum)
 I-131 WHOLE BODY SCAN    RADIOPHARMACEUTICAL: Iodine -131 Capsule for Diagnostic Imaging   PRESCRIBED DOSE FOR ADMINISTRATION: 4 mCi   ROUTE OFADMINISTRATION: PO   DIAGNOSIS: Papillary adenocarcinoma of thyroid     REFERRING PHYSICIAN: Trixie File, MD    THYROGEN  STIMULATION OR HORMONE WITHDRAW: Thyrogen  Stimulating  DATE OF THYROIDECTOMY:  04/12/2017   SURGEON: Aaron Ricker, MD   TSH:   Lab Results  Component Value Date   TSH 1.21 05/02/2024   TSH 0.16 (L) 01/11/2024   TSH 2.61 08/23/2022     PRIOR I-131 THERAPY (Date and Dose): 01/04/22  ADDITIONAL PHYSICIAN COMMENTS/NOTES   AUTHORIZED USER SIGNATURE & TIME STAMP:

## 2024-05-15 DIAGNOSIS — N186 End stage renal disease: Secondary | ICD-10-CM | POA: Diagnosis not present

## 2024-05-15 DIAGNOSIS — Z992 Dependence on renal dialysis: Secondary | ICD-10-CM | POA: Diagnosis not present

## 2024-05-15 DIAGNOSIS — N2581 Secondary hyperparathyroidism of renal origin: Secondary | ICD-10-CM | POA: Diagnosis not present

## 2024-05-15 LAB — HIV-1 RNA QUANT-NO REFLEX-BLD
HIV 1 RNA Quant: 20 {copies}/mL — ABNORMAL HIGH
HIV-1 RNA Quant, Log: 1.3 {Log_copies}/mL — ABNORMAL HIGH

## 2024-05-16 DIAGNOSIS — N2581 Secondary hyperparathyroidism of renal origin: Secondary | ICD-10-CM | POA: Diagnosis not present

## 2024-05-16 DIAGNOSIS — Z992 Dependence on renal dialysis: Secondary | ICD-10-CM | POA: Diagnosis not present

## 2024-05-16 DIAGNOSIS — E1122 Type 2 diabetes mellitus with diabetic chronic kidney disease: Secondary | ICD-10-CM | POA: Diagnosis not present

## 2024-05-16 DIAGNOSIS — N186 End stage renal disease: Secondary | ICD-10-CM | POA: Diagnosis not present

## 2024-05-17 DIAGNOSIS — N2581 Secondary hyperparathyroidism of renal origin: Secondary | ICD-10-CM | POA: Diagnosis not present

## 2024-05-17 DIAGNOSIS — Z992 Dependence on renal dialysis: Secondary | ICD-10-CM | POA: Diagnosis not present

## 2024-05-17 DIAGNOSIS — N186 End stage renal disease: Secondary | ICD-10-CM | POA: Diagnosis not present

## 2024-05-17 LAB — CYTOLOGY - PAP
Comment: NEGATIVE
Diagnosis: NEGATIVE
High risk HPV: NEGATIVE

## 2024-05-18 DIAGNOSIS — Z992 Dependence on renal dialysis: Secondary | ICD-10-CM | POA: Diagnosis not present

## 2024-05-18 DIAGNOSIS — N2581 Secondary hyperparathyroidism of renal origin: Secondary | ICD-10-CM | POA: Diagnosis not present

## 2024-05-18 DIAGNOSIS — N186 End stage renal disease: Secondary | ICD-10-CM | POA: Diagnosis not present

## 2024-05-19 DIAGNOSIS — Z992 Dependence on renal dialysis: Secondary | ICD-10-CM | POA: Diagnosis not present

## 2024-05-19 DIAGNOSIS — N2581 Secondary hyperparathyroidism of renal origin: Secondary | ICD-10-CM | POA: Diagnosis not present

## 2024-05-19 DIAGNOSIS — N186 End stage renal disease: Secondary | ICD-10-CM | POA: Diagnosis not present

## 2024-05-20 DIAGNOSIS — N2581 Secondary hyperparathyroidism of renal origin: Secondary | ICD-10-CM | POA: Diagnosis not present

## 2024-05-20 DIAGNOSIS — N186 End stage renal disease: Secondary | ICD-10-CM | POA: Diagnosis not present

## 2024-05-20 DIAGNOSIS — Z992 Dependence on renal dialysis: Secondary | ICD-10-CM | POA: Diagnosis not present

## 2024-05-21 DIAGNOSIS — N186 End stage renal disease: Secondary | ICD-10-CM | POA: Diagnosis not present

## 2024-05-21 DIAGNOSIS — Z992 Dependence on renal dialysis: Secondary | ICD-10-CM | POA: Diagnosis not present

## 2024-05-21 DIAGNOSIS — N2581 Secondary hyperparathyroidism of renal origin: Secondary | ICD-10-CM | POA: Diagnosis not present

## 2024-05-22 DIAGNOSIS — Z992 Dependence on renal dialysis: Secondary | ICD-10-CM | POA: Diagnosis not present

## 2024-05-22 DIAGNOSIS — N186 End stage renal disease: Secondary | ICD-10-CM | POA: Diagnosis not present

## 2024-05-22 DIAGNOSIS — N2581 Secondary hyperparathyroidism of renal origin: Secondary | ICD-10-CM | POA: Diagnosis not present

## 2024-05-23 DIAGNOSIS — N186 End stage renal disease: Secondary | ICD-10-CM | POA: Diagnosis not present

## 2024-05-23 DIAGNOSIS — Z992 Dependence on renal dialysis: Secondary | ICD-10-CM | POA: Diagnosis not present

## 2024-05-23 DIAGNOSIS — N2581 Secondary hyperparathyroidism of renal origin: Secondary | ICD-10-CM | POA: Diagnosis not present

## 2024-05-24 DIAGNOSIS — N2581 Secondary hyperparathyroidism of renal origin: Secondary | ICD-10-CM | POA: Diagnosis not present

## 2024-05-24 DIAGNOSIS — Z992 Dependence on renal dialysis: Secondary | ICD-10-CM | POA: Diagnosis not present

## 2024-05-24 DIAGNOSIS — N186 End stage renal disease: Secondary | ICD-10-CM | POA: Diagnosis not present

## 2024-05-25 DIAGNOSIS — Z992 Dependence on renal dialysis: Secondary | ICD-10-CM | POA: Diagnosis not present

## 2024-05-25 DIAGNOSIS — N186 End stage renal disease: Secondary | ICD-10-CM | POA: Diagnosis not present

## 2024-05-25 DIAGNOSIS — N2581 Secondary hyperparathyroidism of renal origin: Secondary | ICD-10-CM | POA: Diagnosis not present

## 2024-05-26 DIAGNOSIS — N2581 Secondary hyperparathyroidism of renal origin: Secondary | ICD-10-CM | POA: Diagnosis not present

## 2024-05-26 DIAGNOSIS — N186 End stage renal disease: Secondary | ICD-10-CM | POA: Diagnosis not present

## 2024-05-26 DIAGNOSIS — Z992 Dependence on renal dialysis: Secondary | ICD-10-CM | POA: Diagnosis not present

## 2024-05-27 ENCOUNTER — Encounter (HOSPITAL_COMMUNITY)
Admission: RE | Admit: 2024-05-27 | Discharge: 2024-05-27 | Disposition: A | Discharge status: Home / Self Care | Source: Ambulatory Visit | Attending: Internal Medicine | Admitting: Internal Medicine

## 2024-05-27 DIAGNOSIS — C73 Malignant neoplasm of thyroid gland: Secondary | ICD-10-CM | POA: Diagnosis not present

## 2024-05-27 DIAGNOSIS — N186 End stage renal disease: Secondary | ICD-10-CM | POA: Diagnosis not present

## 2024-05-27 DIAGNOSIS — N2581 Secondary hyperparathyroidism of renal origin: Secondary | ICD-10-CM | POA: Diagnosis not present

## 2024-05-27 DIAGNOSIS — Z992 Dependence on renal dialysis: Secondary | ICD-10-CM | POA: Diagnosis not present

## 2024-05-27 MED ORDER — THYROTROPIN ALFA 0.9 MG IM SOLR
0.9000 mg | INTRAMUSCULAR | Status: AC
  Administered 2024-05-27 (×2): 0.9 mg via INTRAMUSCULAR
  Filled 2024-05-27: qty 0.9

## 2024-05-27 NOTE — Written Directive (Addendum)
 I-131 WHOLE BODY SCAN    RADIOPHARMACEUTICAL: Iodine -131 Capsule for Diagnostic Imaging   PRESCRIBED DOSE FOR ADMINISTRATION: 4 mCi   ROUTE OFADMINISTRATION: PO   DIAGNOSIS: PAPILLARY THYROID  CARCINOMA   REFERRING PHYSICIAN: GHERGHE, MD   THYROGEN  STIMULATION OR HORMONE WITHDRAW: THYROGEN  STIMULATING   DATE OF THYROIDECTOMY:  04/12/17   SURGEON:DWIGHT BATES,MD   TSH:   Lab Results  Component Value Date   TSH 1.21 05/02/2024   TSH 0.16 (L) 01/11/2024   TSH 2.61 08/23/2022     PRIOR I-131 THERAPY (Date and Dose):   ADDITIONAL PHYSICIAN COMMENTS/NOTES   AUTHORIZED USER SIGNATURE & TIME STAMP:  Norleen GORMAN Boxer, MD   05/27/24    10:42 AM

## 2024-05-28 ENCOUNTER — Encounter (HOSPITAL_COMMUNITY)
Admission: RE | Admit: 2024-05-28 | Discharge: 2024-05-28 | Disposition: A | Discharge status: Home / Self Care | Source: Ambulatory Visit | Attending: Internal Medicine | Admitting: Internal Medicine

## 2024-05-28 DIAGNOSIS — N186 End stage renal disease: Secondary | ICD-10-CM | POA: Diagnosis not present

## 2024-05-28 DIAGNOSIS — C73 Malignant neoplasm of thyroid gland: Secondary | ICD-10-CM | POA: Diagnosis not present

## 2024-05-28 DIAGNOSIS — Z992 Dependence on renal dialysis: Secondary | ICD-10-CM | POA: Diagnosis not present

## 2024-05-28 DIAGNOSIS — N2581 Secondary hyperparathyroidism of renal origin: Secondary | ICD-10-CM | POA: Diagnosis not present

## 2024-05-28 MED ORDER — THYROTROPIN ALFA 0.9 MG IM SOLR
INTRAMUSCULAR | Status: AC
  Filled 2024-05-28: qty 0.9

## 2024-05-28 MED ORDER — THYROTROPIN ALFA 0.9 MG IM SOLR
0.9000 mg | INTRAMUSCULAR | Status: AC
  Administered 2024-05-28 (×2): 0.9 mg via INTRAMUSCULAR

## 2024-05-29 ENCOUNTER — Encounter (HOSPITAL_COMMUNITY)
Admission: RE | Admit: 2024-05-29 | Discharge: 2024-05-29 | Disposition: A | Discharge status: Home / Self Care | Source: Ambulatory Visit | Attending: Internal Medicine | Admitting: Internal Medicine

## 2024-05-29 DIAGNOSIS — N186 End stage renal disease: Secondary | ICD-10-CM | POA: Diagnosis not present

## 2024-05-29 DIAGNOSIS — C73 Malignant neoplasm of thyroid gland: Secondary | ICD-10-CM | POA: Diagnosis not present

## 2024-05-29 DIAGNOSIS — Z992 Dependence on renal dialysis: Secondary | ICD-10-CM | POA: Diagnosis not present

## 2024-05-29 DIAGNOSIS — N2581 Secondary hyperparathyroidism of renal origin: Secondary | ICD-10-CM | POA: Diagnosis not present

## 2024-05-29 MED ORDER — SODIUM IODIDE I 131 CAPSULE
4.2800 | Freq: Once | INTRAVENOUS | Status: AC | PRN
  Administered 2024-05-29 (×2): 4.28 via ORAL

## 2024-05-30 DIAGNOSIS — N186 End stage renal disease: Secondary | ICD-10-CM | POA: Diagnosis not present

## 2024-05-30 DIAGNOSIS — N2581 Secondary hyperparathyroidism of renal origin: Secondary | ICD-10-CM | POA: Diagnosis not present

## 2024-05-30 DIAGNOSIS — Z992 Dependence on renal dialysis: Secondary | ICD-10-CM | POA: Diagnosis not present

## 2024-05-31 ENCOUNTER — Ambulatory Visit (HOSPITAL_COMMUNITY)
Admission: RE | Admit: 2024-05-31 | Discharge: 2024-05-31 | Disposition: A | Discharge status: Home / Self Care | Source: Ambulatory Visit | Attending: Internal Medicine | Admitting: Internal Medicine

## 2024-05-31 ENCOUNTER — Ambulatory Visit: Payer: Self-pay | Admitting: *Deleted

## 2024-05-31 DIAGNOSIS — Z992 Dependence on renal dialysis: Secondary | ICD-10-CM | POA: Diagnosis not present

## 2024-05-31 DIAGNOSIS — N2581 Secondary hyperparathyroidism of renal origin: Secondary | ICD-10-CM | POA: Diagnosis not present

## 2024-05-31 DIAGNOSIS — N186 End stage renal disease: Secondary | ICD-10-CM | POA: Diagnosis not present

## 2024-05-31 DIAGNOSIS — C73 Malignant neoplasm of thyroid gland: Secondary | ICD-10-CM | POA: Diagnosis not present

## 2024-05-31 NOTE — Telephone Encounter (Signed)
 Noted

## 2024-05-31 NOTE — Telephone Encounter (Signed)
 FYI Only or Action Required?: FYI only for provider.  Patient was last seen in primary care on 03/29/2024 by Gretta Comer POUR, NP.  Called Nurse Triage reporting No chief complaint on file..  Symptoms began about a month ago.  Interventions attempted: Nothing.  Symptoms are: gradually worsening.  Triage Disposition: See Physician Within 24 Hours  Patient/caregiver understands and will follow disposition?: No, refuses disposition    Reason for Disposition  [1] Hallucinations AND [2] NEW-ONSET  Answer Assessment - Initial Assessment Questions Patient only having visual hallucinations- she states her symptoms are worse- harder to control what she is really seeing- patient advised ED due to late hours- patient declines- insists on OV. Patient has been scheduled- she agrees if things get worse over the week end - she will be seen at ED.   1. MAIN CONCERN: What happened that made you call today?     Patient states she sees things that aren't there- watermellon seed- look like bugs, etc 2. DESCRIPTION: What are the hallucinations like? Describe them for me. (e.g., auditory, visual, tactile; worsening; threatening) If auditory, ask Are they telling you to hurt yourself or someone else?     visual 3. ONSET: When did this start? (e.g., hours, days, months)     Over 1 month- more intense  4. PATTERN: Do they come and go, or are they present all the time?  Are they present now?     Comes and goes- usually all day-patient has to really try hard not to see things 5. PRIOR HALLUCINATIONS: Have you had hallucinations in the past? (e.g., same, different, worse)     never 6. MENTAL HEALTH HISTORY: Have you ever been diagnosed with schizophrenia or any other mental health problem? (e.g., bipolar disorder, schizophrenia; dementia, Parkinsons)     no 7. MENTAL HEALTH MEDICINES: Are you taking any medicines for depression or other mental health problems? (e.g., psychiatric  medicines; sleeping pills)     No- pain medication- tramadol  8. ALCOHOL  or DRUG ABUSE: Have you been drinking alcohol  or taking any drugs? Have you recently stopped drinking alcohol  or using drugs?     no 9. MEDICINE CHANGES: Did you recently stop taking a medicine? (e.g., antidepressant, barbiturates, benzodiazepine, gabapentin )  Did you recently start a new medicine or increase the dose? (e.g., antidepressant, digoxin, sleep medicine, steroid, Parkinson's medicine)     no 10. SUPPORT: Is there anyone else with you?       Patient has help if needed 11. STRESS: Are you experiencing any particular stressors?       Back pain constant 12. OTHER SYMPTOMS: Are there any other symptoms? (e.g., difficulty breathing, headache, fever, weakness)       anxiety  Protocols used: Hallucinations-A-AH   Copied from CRM #[id number]. Topic: Clinical - Red Word Triage >> May 31, 2024  3:15 PM Thersia BROCKS wrote: [facility] that prompted transfer to Nurse Triage: Patient called in stating she is having halluications and it is getting worst, not sure if its the medication or what

## 2024-06-01 DIAGNOSIS — N2581 Secondary hyperparathyroidism of renal origin: Secondary | ICD-10-CM | POA: Diagnosis not present

## 2024-06-01 DIAGNOSIS — Z992 Dependence on renal dialysis: Secondary | ICD-10-CM | POA: Diagnosis not present

## 2024-06-01 DIAGNOSIS — N186 End stage renal disease: Secondary | ICD-10-CM | POA: Diagnosis not present

## 2024-06-02 DIAGNOSIS — N186 End stage renal disease: Secondary | ICD-10-CM | POA: Diagnosis not present

## 2024-06-02 DIAGNOSIS — N2581 Secondary hyperparathyroidism of renal origin: Secondary | ICD-10-CM | POA: Diagnosis not present

## 2024-06-02 DIAGNOSIS — Z992 Dependence on renal dialysis: Secondary | ICD-10-CM | POA: Diagnosis not present

## 2024-06-03 ENCOUNTER — Ambulatory Visit (INDEPENDENT_AMBULATORY_CARE_PROVIDER_SITE_OTHER): Admitting: Internal Medicine

## 2024-06-03 ENCOUNTER — Encounter: Payer: Self-pay | Admitting: Internal Medicine

## 2024-06-03 VITALS — BP 112/70 | HR 96 | Temp 97.6°F | Ht 65.0 in | Wt 177.0 lb

## 2024-06-03 DIAGNOSIS — Z992 Dependence on renal dialysis: Secondary | ICD-10-CM | POA: Diagnosis not present

## 2024-06-03 DIAGNOSIS — N186 End stage renal disease: Secondary | ICD-10-CM | POA: Diagnosis not present

## 2024-06-03 DIAGNOSIS — N2581 Secondary hyperparathyroidism of renal origin: Secondary | ICD-10-CM | POA: Diagnosis not present

## 2024-06-03 DIAGNOSIS — R443 Hallucinations, unspecified: Secondary | ICD-10-CM | POA: Insufficient documentation

## 2024-06-03 NOTE — Assessment & Plan Note (Addendum)
 No obvious inciting medications Discussed that she should try to limit the tramadol  Is getting an eye exam Will refer to neurology  She wonders about this being from anxiety--and is thinking she may need something for anxiety Will have her try to set up with Mallie

## 2024-06-03 NOTE — Progress Notes (Signed)
 Subjective:    Patient ID: [name], female    DOB: Jun 27, 1966, 58 y.o.   MRN: [id number]  HPI Here due to hallucinations  Started about 2.5 months ago At first, thought it was just her nerves Has altered sensation of things like hole in bread or Tate in watermelon (like bugs) Bowl of rice looked like maggots Grass clippings also looked strange The reels on Facebook were scary Then those visions stay in my head  No auditory hallucinations  Home hemodialysis ---she keeps on schedule  Does have eye exam in 2 week  No depression--but does have some panic spells  Current Outpatient Medications on File Prior to Visit  Medication Sig Dispense Refill   acetaminophen  (TYLENOL ) 500 MG tablet Take 1,000 mg by mouth every 6 (six) hours as needed for mild pain.     albuterol  (VENTOLIN  HFA) 108 (90 Base) MCG/ACT inhaler INHALE 2 PUFFS INTO THE LUNGS UP TO EVERY 6HRS AS NEEDED FOR WHEEZING/SHORTNESS OF BREATH 8 each 0   allopurinol  (ZYLOPRIM ) 100 MG tablet TAKE 1 TABLET (100 MG TOTAL) BY MOUTH DAILY. FOR GOUT PREVENTION 90 tablet 2   amLODipine  (NORVASC ) 10 MG tablet Take 10 mg by mouth daily.     aspirin  EC 81 MG tablet Take 81 mg by mouth daily.     AURYXIA  1 GM 210 MG(Fe) tablet Take 210 mg by mouth 3 (three) times daily with meals.     Blood Glucose Monitoring Suppl (FREESTYLE LITE) w/Device KIT Inject 1 each into the skin daily. 1 kit 0   carvedilol  (COREG ) 12.5 MG tablet Take 12.5 mg by mouth 2 (two) times daily.     dolutegravir  ([name] ) 50 MG tablet Take 1 tablet (50 mg total) by mouth daily. 30 tablet 11   emtricitabine -tenofovir  AF (DESCOVY ) 200-25 MG tablet Take 1 tablet by mouth daily. 30 tablet 11   fluticasone  (FLOVENT  HFA) 110 MCG/ACT inhaler Inhale 1 puff into the lungs 2 (two) times daily.     Lancets (ONETOUCH ULTRASOFT) lancets Use as instructed to test blood sugar daily 100 each 5   levocetirizine (XYZAL ) 5 MG tablet TAKE 1 TABLET BY MOUTH EVERY DAY IN  THE EVENING FOR ALLERGIES 90 tablet 1   lidocaine  (LIDODERM ) 5 % PLACE 1 PATCH ONTO THE SKIN DAILY. REMOVE & DISCARD PATCH WITHIN 12 HOURS OR AS DIRECTED BY MD 30 patch 0   losartan  (COZAAR ) 50 MG tablet TAKE 1 TABLET (50 MG TOTAL) BY MOUTH DAILY FOR BLOOD PRESSURE. 90 tablet 1   minocycline  (MINOCIN ) 100 MG capsule Take 1 capsule (100 mg total) by mouth 2 (two) times daily. 60 capsule 5   omeprazole  (PRILOSEC) 40 MG capsule Take 1 capsule (40 mg total) by mouth daily. for heartburn. 90 capsule 2   saxagliptin  HCl (ONGLYZA) 2.5 MG TABS tablet Take 1 tablet (2.5 mg total) by mouth daily. for diabetes. 90 tablet 1   SYNTHROID  150 MCG tablet TAKE 1 TABLET BY MOUTH DAILY BEFORE BREAKFAST. 90 tablet 1   traMADol  (ULTRAM ) 50 MG tablet Take 1 tablet (50 mg total) by mouth every 12 (twelve) hours as needed. 15 tablet 0   cyclobenzaprine  (FLEXERIL ) 10 MG tablet Take 1 tablet (10 mg total) by mouth 3 (three) times daily as needed for muscle spasms. (Patient not taking: Reported on 06/03/2024) 30 tablet 0   rosuvastatin  (CRESTOR ) 10 MG tablet TAKE 1 TABLET BY MOUTH EVERY DAY FOR CHOLESTEROL (Patient not taking: Reported on 06/03/2024) 90 tablet 2   No current  facility-administered medications on file prior to visit.    Allergies  Allergen Reactions   Genvoya [Elviteg-Cobic-Emtricit-Tenofaf] Hives   Lisinopril Cough   Aldactone  [Spironolactone ] Hives   Tegaderm Ag Mesh [Silver] Itching    Past Medical History:  Diagnosis Date   Acute pain of right shoulder due to trauma 06/08/2023   Acute pancreatitis 10/23/2021   Acute renal failure superimposed on stage 4 chronic kidney disease (HCC) 10/24/2021   Allergy    Anemia    Normocytic   Antibiotic-induced yeast infection 09/28/2020   Anxiety    Asthma    Bronchitis 2005   Bursitis of left shoulder 09/06/2019   CKD (chronic kidney disease)    Dialysis on Mon-Wed- Fri   CLASS 1-EXOPHTHALMOS-THYROTOXIC 02/08/2007   Cognitive dysfunction 02/21/2020    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  Genes Analyzed (43 total): APC*, ATM*, AXIN2, BARD1, BMPR1A, BRCA1*, BRCA2*, BRIP1*, CDH1*, CDK4, CDKN2A, CHEK2*, DICER1, GALNT12, HOXB13, MEN1, MLH1*, MRE11A, MSH2*, MSH3, MSH6*, MUTYH*, NBN, NF1*, NTHL1, PALB2*, PMS2*, POLD1, POLE, PTEN*, RAD50, RAD51C*, RAD51D*, RET, SDHB, SDHD, SMAD4, SMARCA4, STK11 and TP53* (sequencing and deletion/duplication); EGFR (s   GERD 07/24/2006   GRAVE'S DISEASE 01/01/2008   History of hidradenitis suppurativa    History of kidney stones    History of thrush    HIV DISEASE 07/24/2006   dx March 05   Hyperlipidemia    HYPERTENSION 07/24/2006   Hyperthyroidism 08/2006   Grave's Disease -diffuse radiotracer uptake 08/25/06 Thyroid  scan-Cold nodule to R lower lobe of thyrorid   Ileus (HCC) 02/14/2023   Left bundle branch block (LBBB)    Malnutrition of moderate degree (HCC) 02/08/2023   Menometrorrhagia    hx of   Nephrolithiasis    Panniculitis 05/12/2020   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 Completed 2 yrs Prednisone  after lung bx confirmation. No symptoms since then.   Sebaceous cyst 04/21/2020   Suppurative hidradenitis    Thyroid  cancer (HCC)    THYROID  NODULE, RIGHT 02/08/2007    Past Surgical History:  Procedure Laterality Date   AORTIC ARCH DEBRANCHING N/A 08/25/2023   Procedure: AORTIC ARCH DEBRANCHING USING 12X8X8MM HEMASHIELD GRAFT;  Surgeon: Lucas Dorise POUR, MD;  Location: [facility];  Service: Open Heart Surgery;  Laterality: N/A;  with bypasses to the innominate and left  common carotid arteries   APPLICATION OF WOUND VAC N/A 01/20/2021   Procedure: APPLICATION OF WOUND VAC;  Surgeon: Lowery Estefana RAMAN, DO;  Location: [facility];  Service: Plastics;  Laterality: N/A;   BREAST EXCISIONAL BIOPSY Right 04/26/2018   right axilla negative   BREAST EXCISIONAL BIOPSY Left 04/26/2018   left axilla negative   BREAST LUMPECTOMY Right 10/03/2018   malignant   BREAST LUMPECTOMY WITH RADIOACTIVE SEED AND SENTINEL LYMPH NODE BIOPSY Right 10/03/2018   Procedure: RIGHT BREAST LUMPECTOMY WITH RADIOACTIVE SEED AND SENTINEL LYMPH NODE MAPPING;  Surgeon: Vanderbilt Ned, MD;  Location: [facility];  Service: General;  Laterality: Right;   BREAST SURGERY  1997   Breast Reduction    CAPD INSERTION N/A 12/21/2023   Procedure: LAPAROSCOPIC INSERTION CONTINUOUS AMBULATORY PERITONEAL DIALYSIS  (CAPD) CATHETER; LAPAROSCOPIC OMENTUMPEXY;  Surgeon: Magda Ned SAILOR, MD;  Location: [facility];  Service: Vascular;  Laterality: N/A;   CYSTOSCOPY W/ URETERAL STENT REMOVAL  11/09/2012   Procedure: CYSTOSCOPY WITH STENT REMOVAL;  Surgeon: Ricardo Likens, MD;  Location: [facility];  Service: Urology;  Laterality: Right;   CYSTOSCOPY WITH RETROGRADE  PYELOGRAM, URETEROSCOPY AND STENT PLACEMENT  11/09/2012   Procedure: CYSTOSCOPY WITH RETROGRADE PYELOGRAM, URETEROSCOPY AND STENT PLACEMENT;  Surgeon: Ricardo Likens, MD;  Location: [facility];  Service: Urology;  Laterality: Left;  LEFT URETEROSCOPY, STONE MANIPULATION, left STENT exchange    CYSTOSCOPY WITH STENT PLACEMENT  10/02/2012   Procedure: CYSTOSCOPY WITH STENT PLACEMENT;  Surgeon: Ricardo Likens, MD;  Location: [facility];  Service: Urology;  Laterality: Left;   DEBRIDEMENT AND CLOSURE WOUND N/A 01/20/2021   Procedure: Excision of abdominal wound with closure;  Surgeon: Lowery Estefana RAMAN, DO;  Location: [facility];  Service: Plastics;  Laterality: N/A;   DILATION AND CURETTAGE OF UTERUS  11/2002   s/p for 1st trimester nonviable pregnancy   EYE SURGERY     sty under eyelid   INCISE AND DRAIN ABCESS  08/2002   s/p I &D for righ inframmary fold hidradenitis   INCISION AND DRAINAGE PERITONSILLAR ABSCESS  12/2001   IR CV LINE INJECTION  06/07/2018   IR FLUORO GUIDE CV LINE RIGHT  01/30/2023   IR IMAGING GUIDED PORT INSERTION  06/20/2018   IR REMOVAL TUN ACCESS W/ PORT W/O FL MOD SED  06/20/2018   IR US  GUIDE VASC ACCESS RIGHT  01/30/2023   IRRIGATION AND DEBRIDEMENT ABSCESS  01/31/2012   Procedure: IRRIGATION AND DEBRIDEMENT ABSCESS;  Surgeon: Alm VEAR Angle, MD;  Location: [facility];  Service: General;  Laterality: Right;  right breast and axilla    LAPAROSCOPIC REPOSITIONING CAPD CATHETER Left 01/10/2024   Procedure: Removal of peritoneal dialysis Catheter.;  Surgeon: Magda Ned SAILOR, MD;  Location: [facility] OR;  Service: Vascular;  Laterality: Left;   LAPAROSCOPY N/A 01/10/2024   Procedure: LAPAROSCOPY, DIAGNOSTIC;  Surgeon: Magda Ned SAILOR, MD;  Location: [facility] OR;  Service: Vascular;  Laterality: N/A;   NEPHROLITHOTOMY  10/02/2012   Procedure: NEPHROLITHOTOMY PERCUTANEOUS;  Surgeon: Ricardo Likens, MD;  Location: [facility];  Service: Urology;  Laterality: Right;  First Stage  Percutaneous Nephrolithotomy with Surgeon Access, Left Ureteral Stent     NEPHROLITHOTOMY  10/04/2012   Procedure: NEPHROLITHOTOMY PERCUTANEOUS SECOND LOOK;  Surgeon: Ricardo Likens, MD;  Location: [facility];  Service: Urology;  Laterality: Right;      NEPHROLITHOTOMY  10/08/2012   Procedure: NEPHROLITHOTOMY PERCUTANEOUS;  Surgeon: Ricardo Likens, MD;  Location: [facility];  Service: Urology;  Laterality: Right;  THIRD  STAGE, nephrostomy tube exchange x 2   NEPHROLITHOTOMY  10/11/2012   Procedure: NEPHROLITHOTOMY PERCUTANEOUS SECOND LOOK;  Surgeon: Ricardo Likens, MD;  Location: [facility];  Service: Urology;  Laterality: Right;  RIGHT 4 STAGE PERCUTANOUS NEPHROLITHOTOMY, right URETEROSCOPY WITH HOLMIUM LASER    PANNICULECTOMY N/A 12/21/2020   Procedure: PANNICULECTOMY;  Surgeon: Lowery Estefana RAMAN, DO;  Location: [facility];  Service: Plastics;  Laterality: N/A;   PERCUTANEOUS NEPHROSTOLITHOTOMY  04/2022   PORT-A-CATH REMOVAL N/A 07/16/2020   Procedure: REMOVAL PORT-A-CATH;  Surgeon: Vanderbilt Ned, MD;  Location: [facility];  Service: General;  Laterality: N/A;   PORTACATH PLACEMENT Left 05/17/2018   Procedure: INSERTION PORT-A-CATH;  Surgeon: Vernetta Berg, MD;  Location: [facility];  Service: General;  Laterality: Left;   RADICAL NECK DISSECTION  04/12/2017   limited/notes 04/12/2017   RADICAL NECK DISSECTION N/A 04/12/2017   Procedure: RADICAL NECK DISSECTION;  Surgeon: Carlie Clark, MD;  Location: [facility] OR;  Service: ENT;  Laterality: N/A;  limited neck dissection 2 hours total   REDUCTION MAMMAPLASTY Bilateral 1998   RIGHT/LEFT HEART CATH AND CORONARY ANGIOGRAPHY N/A 03/12/2020   Procedure: RIGHT/LEFT HEART CATH AND CORONARY ANGIOGRAPHY;  Surgeon: Cherrie Toribio SAUNDERS, MD;  [facility];  Service: Cardiovascular;  Laterality: N/A;   [place]  1994   TEE WITHOUT CARDIOVERSION N/A 08/25/2023   Procedure: TRANSESOPHAGEAL ECHOCARDIOGRAM;  Surgeon: Lucas Dorise POUR, MD;  Location: [facility];  Service: Open Heart Surgery;  Laterality: N/A;   TENCKHOFF CATHETER INSERTION Left 01/10/2024   Procedure: INSERTION, CATHETER, DIALYSIS, PERITONEAL;  Surgeon: Magda Ned SAILOR, MD;  Location: [facility];  Service: Vascular;  Laterality: Left;   THORACIC AORTIC ENDOVASCULAR STENT GRAFT N/A 02/02/2023   Procedure: THORACIC AORTIC ENDOVASCULAR STENT GRAFT;  Surgeon: Lanis Fonda BRAVO, MD;  Location: [facility] OR;  Service: Vascular;  Laterality: N/A;   THORACIC AORTIC ENDOVASCULAR STENT GRAFT N/A 08/25/2023   Procedure: THORACIC AORTIC ENDOVASCULAR STENT GRAFT;  Surgeon: Lanis Fonda BRAVO, MD;  Location: [facility] OR;  Service: Vascular;  Laterality: N/A;   THYROIDECTOMY  04/12/2017   completion/notes 04/12/2017   THYROIDECTOMY N/A 04/12/2017   Procedure: THYROIDECTOMY;  Surgeon: Carlie Clark, MD;  Location: [facility] OR;  Service: ENT;  Laterality: N/A;  Completion Thyroidectomy   TOTAL THYROIDECTOMY  2010   ULTRASOUND GUIDANCE FOR VASCULAR ACCESS Right 08/25/2023   Procedure: ULTRASOUND GUIDANCE FOR VASCULAR ACCESS, RIGHT FEMORAL ARTERY;  Surgeon: Lanis Fonda BRAVO, MD;  Location: [facility] OR;  Service: Vascular;  Laterality: Right;    Family History  Problem Relation Age of Onset   Hypertension Mother    Cancer Mother        laryngeal   Heart disease Mother        stent   Pancreatic cancer Father    Hypertension Father    Lung cancer Father 31       hx smoking   Hypertension Sister    Hypertension Sister    Hypertension Brother    Breast cancer Maternal Aunt 27   Breast cancer Maternal Aunt        dx 60+   Cancer Maternal Uncle        Lung CA   Breast cancer Paternal Aunt 71   Breast cancer Paternal Aunt        dx 43's   Breast cancer Paternal Aunt        dx 50's   Prostate cancer Paternal Uncle    Prostate cancer Paternal Uncle  Lung cancer Paternal Uncle    Breast cancer Cousin 14   Breast cancer Cousin        dx <50   Breast cancer Cousin        dx <50   Breast cancer  Cousin        dx <50   Heart disease Other    Hypertension Other    Stroke Other        Grandparent   Kidney disease Other        Grandparent   Diabetes Other        FH of Diabetes   Colon cancer Neg Hx    Esophageal cancer Neg Hx    Rectal cancer Neg Hx    Stomach cancer Neg Hx     Social History   Socioeconomic History   Marital status: Single    Spouse name: Not on file   Number of children: 1   Years of education: Not on file   Highest education level: Not on file  Occupational History   Occupation: Investment banker, corporate  Tobacco Use   Smoking status: Former    Current packs/day: 0.00    Average packs/day: 0.5 packs/day for 15.0 years (7.5 ttl pk-yrs)    Types: Cigarettes    Start date: 04/12/2017    Quit date: 2019    Years since quitting: 6.6   Smokeless tobacco: Never  Vaping Use   Vaping status: Never Used  Substance and Sexual Activity   Alcohol  use: Not Currently    Comment: occasional wine   Drug use: No   Sexual activity: Not Currently    Birth control/protection: Post-menopausal  Other Topics Concern   Not on file  Social History Narrative   Right Handed    Lives in a two story home   Social Drivers of Health   Financial Resource Strain: High Risk (01/08/2024)   Overall Financial Resource Strain (CARDIA)    Difficulty of Paying Living Expenses: Hard  Food Insecurity: No Food Insecurity (02/22/2024)   Hunger Vital Sign    Worried About Running Out of Food in the Last Year: Never true    Ran Out of Food in the Last Year: Never true  Recent Concern: Food Insecurity - Food Insecurity Present (01/08/2024)   Hunger Vital Sign    Worried About Running Out of Food in the Last Year: Sometimes true    Ran Out of Food in the Last Year: Sometimes true  Transportation Needs: No Transportation Needs (02/22/2024)   PRAPARE - Administrator, Civil Service (Medical): No    Lack of Transportation (Non-Medical): No  Physical Activity: Not on file  Stress:  Not on file  Social Connections: Not on file  Intimate Partner Violence: Not At Risk (02/22/2024)   Humiliation, Afraid, Rape, and Kick questionnaire    Fear of Current or Ex-Partner: No    Emotionally Abused: No    Physically Abused: No    Sexually Abused: No   Review of Systems No fever or illness Some back problems---has seen spine doctor No history of bipolar or schizophrenia No memory issues    Objective:   Physical Exam Constitutional:      Appearance: Normal appearance.  Neurological:     Mental Status: She is alert.     Comments: No bradykinesia  Normal gait --no Parkinsonism  Psychiatric:     Comments: No overt depression No thought process disturbance  Assessment & Plan:

## 2024-06-04 DIAGNOSIS — N2581 Secondary hyperparathyroidism of renal origin: Secondary | ICD-10-CM | POA: Diagnosis not present

## 2024-06-04 DIAGNOSIS — Z992 Dependence on renal dialysis: Secondary | ICD-10-CM | POA: Diagnosis not present

## 2024-06-04 DIAGNOSIS — N186 End stage renal disease: Secondary | ICD-10-CM | POA: Diagnosis not present

## 2024-06-05 ENCOUNTER — Telehealth (INDEPENDENT_AMBULATORY_CARE_PROVIDER_SITE_OTHER): Admitting: Primary Care

## 2024-06-05 ENCOUNTER — Telehealth: Payer: Self-pay | Admitting: Primary Care

## 2024-06-05 DIAGNOSIS — Z992 Dependence on renal dialysis: Secondary | ICD-10-CM | POA: Diagnosis not present

## 2024-06-05 DIAGNOSIS — F411 Generalized anxiety disorder: Secondary | ICD-10-CM

## 2024-06-05 DIAGNOSIS — N898 Other specified noninflammatory disorders of vagina: Secondary | ICD-10-CM

## 2024-06-05 DIAGNOSIS — N186 End stage renal disease: Secondary | ICD-10-CM | POA: Diagnosis not present

## 2024-06-05 DIAGNOSIS — R443 Hallucinations, unspecified: Secondary | ICD-10-CM

## 2024-06-05 DIAGNOSIS — N2581 Secondary hyperparathyroidism of renal origin: Secondary | ICD-10-CM | POA: Diagnosis not present

## 2024-06-05 MED ORDER — FLUCONAZOLE 150 MG PO TABS: 150.0000 mg | ORAL_TABLET | Freq: Once | ORAL | 0 refills | Status: AC

## 2024-06-05 MED ORDER — BUSPIRONE HCL 5 MG PO TABS: 5.0000 mg | ORAL_TABLET | Freq: Two times a day (BID) | ORAL | 0 refills | Status: DC

## 2024-06-05 NOTE — Telephone Encounter (Signed)
 Called patient and reviewed all information. Patient verbalized understanding. Will call if any further questions.

## 2024-06-05 NOTE — Assessment & Plan Note (Signed)
 Agree with neurology referral that was placed last week.  Will work to treat anxiety.  Options are limited given her peritoneal dialysis.  The safest option seems to be buspirone  according to up-to-date.  Start buspirone  5 mg twice daily for anxiety. Close follow-up in 1 month.

## 2024-06-05 NOTE — Patient Instructions (Signed)
 Start buspirone  (BuSpar ) 5 mg tablets twice daily for anxiety.  Take the fluconazole  pill once for itching.  Please schedule a follow-up visit for 1 month.  It was a pleasure to see you today!

## 2024-06-05 NOTE — Assessment & Plan Note (Signed)
 Evident.  Will treat with buspirone  5 mg twice daily.  Avoid SSRIs given potential for QT prolongation. Avoid SNRIs as they are not dialyzable.  Close follow-up in 1 month.
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
## 2024-06-05 NOTE — Telephone Encounter (Signed)
 Please notify patient that because of her peritoneal dialysis I cannot do the Cymbalta for anxiety.  I sent a prescription for buspirone  to her pharmacy for anxiety.  Start buspirone  (BuSpar ) 5 mg tablets twice daily for anxiety.  This may work a little faster than the Cymbalta.

## 2024-06-06 ENCOUNTER — Telehealth: Payer: Self-pay | Admitting: *Deleted

## 2024-06-06 DIAGNOSIS — M5440 Lumbago with sciatica, unspecified side: Secondary | ICD-10-CM | POA: Diagnosis not present

## 2024-06-06 DIAGNOSIS — C50919 Malignant neoplasm of unspecified site of unspecified female breast: Secondary | ICD-10-CM | POA: Diagnosis not present

## 2024-06-06 DIAGNOSIS — I714 Abdominal aortic aneurysm, without rupture, unspecified: Secondary | ICD-10-CM | POA: Diagnosis not present

## 2024-06-06 DIAGNOSIS — N186 End stage renal disease: Secondary | ICD-10-CM | POA: Diagnosis not present

## 2024-06-06 DIAGNOSIS — N2581 Secondary hyperparathyroidism of renal origin: Secondary | ICD-10-CM | POA: Diagnosis not present

## 2024-06-06 DIAGNOSIS — R200 Anesthesia of skin: Secondary | ICD-10-CM | POA: Diagnosis not present

## 2024-06-06 DIAGNOSIS — Z992 Dependence on renal dialysis: Secondary | ICD-10-CM | POA: Diagnosis not present

## 2024-06-06 NOTE — Progress Notes (Signed)
 Complex Care Management Care Guide Note  06/06/2024 Name: [name] MRN: [id number] DOB: Feb 21, 1966  [name] is a 58 y.o. year old female who is a primary care patient of Gretta Comer POUR, NP and is actively engaged with the care management team. I reached out to [name] by phone today to assist with re-scheduling  with the RN Case Manager.  Follow up plan: Unsuccessful telephone outreach attempt made. A HIPAA compliant phone message was left for the patient providing contact information and requesting a return call. Harlene Satterfield  [facility]  Value-Based Care Institute, [facility] Guide  Direct Dial: [phone]  Fax [phone]

## 2024-06-07 ENCOUNTER — Other Ambulatory Visit (HOSPITAL_COMMUNITY)

## 2024-06-07 DIAGNOSIS — N186 End stage renal disease: Secondary | ICD-10-CM | POA: Diagnosis not present

## 2024-06-07 DIAGNOSIS — N2581 Secondary hyperparathyroidism of renal origin: Secondary | ICD-10-CM | POA: Diagnosis not present

## 2024-06-07 DIAGNOSIS — Z992 Dependence on renal dialysis: Secondary | ICD-10-CM | POA: Diagnosis not present

## 2024-06-08 DIAGNOSIS — N2581 Secondary hyperparathyroidism of renal origin: Secondary | ICD-10-CM | POA: Diagnosis not present

## 2024-06-08 DIAGNOSIS — Z992 Dependence on renal dialysis: Secondary | ICD-10-CM | POA: Diagnosis not present

## 2024-06-08 DIAGNOSIS — N186 End stage renal disease: Secondary | ICD-10-CM | POA: Diagnosis not present

## 2024-06-09 DIAGNOSIS — Z992 Dependence on renal dialysis: Secondary | ICD-10-CM | POA: Diagnosis not present

## 2024-06-09 DIAGNOSIS — N186 End stage renal disease: Secondary | ICD-10-CM | POA: Diagnosis not present

## 2024-06-09 DIAGNOSIS — N2581 Secondary hyperparathyroidism of renal origin: Secondary | ICD-10-CM | POA: Diagnosis not present

## 2024-06-10 DIAGNOSIS — N2581 Secondary hyperparathyroidism of renal origin: Secondary | ICD-10-CM | POA: Diagnosis not present

## 2024-06-10 DIAGNOSIS — N186 End stage renal disease: Secondary | ICD-10-CM | POA: Diagnosis not present

## 2024-06-10 DIAGNOSIS — Z992 Dependence on renal dialysis: Secondary | ICD-10-CM | POA: Diagnosis not present

## 2024-06-11 DIAGNOSIS — Z992 Dependence on renal dialysis: Secondary | ICD-10-CM | POA: Diagnosis not present

## 2024-06-11 DIAGNOSIS — N186 End stage renal disease: Secondary | ICD-10-CM | POA: Diagnosis not present

## 2024-06-11 DIAGNOSIS — N2581 Secondary hyperparathyroidism of renal origin: Secondary | ICD-10-CM | POA: Diagnosis not present

## 2024-06-11 NOTE — Progress Notes (Unsigned)
 Complex Care Management Care Guide Note  06/11/2024 Name: [name] MRN: [id number] DOB: [birth year]  [name] is a 58 y.o. year old female who is a primary care patient of Gretta Comer POUR, NP and is actively engaged with the care management team. I reached out to [name] by phone today to assist with re-scheduling  with the RN Case Manager.  Follow up plan: Unsuccessful telephone outreach attempt made. A HIPAA compliant phone message was left for the patient providing contact information and requesting a return call.No further outreach attempts will be made at this time. We have been unable to contact the patient to reschedule for complex care management services.   Harlene Satterfield  [facility]  Value-Based Care Institute, [facility] Guide  Direct Dial: [phone]  Fax [phone]

## 2024-06-12 DIAGNOSIS — N186 End stage renal disease: Secondary | ICD-10-CM | POA: Diagnosis not present

## 2024-06-12 DIAGNOSIS — Z992 Dependence on renal dialysis: Secondary | ICD-10-CM | POA: Diagnosis not present

## 2024-06-12 DIAGNOSIS — N2581 Secondary hyperparathyroidism of renal origin: Secondary | ICD-10-CM | POA: Diagnosis not present

## 2024-06-13 DIAGNOSIS — N186 End stage renal disease: Secondary | ICD-10-CM | POA: Diagnosis not present

## 2024-06-13 DIAGNOSIS — N2581 Secondary hyperparathyroidism of renal origin: Secondary | ICD-10-CM | POA: Diagnosis not present

## 2024-06-13 DIAGNOSIS — Z992 Dependence on renal dialysis: Secondary | ICD-10-CM | POA: Diagnosis not present

## 2024-06-14 ENCOUNTER — Telehealth: Payer: Self-pay | Admitting: *Deleted

## 2024-06-14 DIAGNOSIS — N186 End stage renal disease: Secondary | ICD-10-CM | POA: Diagnosis not present

## 2024-06-14 DIAGNOSIS — N2581 Secondary hyperparathyroidism of renal origin: Secondary | ICD-10-CM | POA: Diagnosis not present

## 2024-06-14 DIAGNOSIS — Z992 Dependence on renal dialysis: Secondary | ICD-10-CM | POA: Diagnosis not present

## 2024-06-15 DIAGNOSIS — N2581 Secondary hyperparathyroidism of renal origin: Secondary | ICD-10-CM | POA: Diagnosis not present

## 2024-06-15 DIAGNOSIS — Z992 Dependence on renal dialysis: Secondary | ICD-10-CM | POA: Diagnosis not present

## 2024-06-15 DIAGNOSIS — N186 End stage renal disease: Secondary | ICD-10-CM | POA: Diagnosis not present

## 2024-06-16 DIAGNOSIS — N2581 Secondary hyperparathyroidism of renal origin: Secondary | ICD-10-CM | POA: Diagnosis not present

## 2024-06-16 DIAGNOSIS — N186 End stage renal disease: Secondary | ICD-10-CM | POA: Diagnosis not present

## 2024-06-16 DIAGNOSIS — E1122 Type 2 diabetes mellitus with diabetic chronic kidney disease: Secondary | ICD-10-CM | POA: Diagnosis not present

## 2024-06-16 DIAGNOSIS — Z992 Dependence on renal dialysis: Secondary | ICD-10-CM | POA: Diagnosis not present

## 2024-06-17 DIAGNOSIS — N2581 Secondary hyperparathyroidism of renal origin: Secondary | ICD-10-CM | POA: Diagnosis not present

## 2024-06-17 DIAGNOSIS — N186 End stage renal disease: Secondary | ICD-10-CM | POA: Diagnosis not present

## 2024-06-17 DIAGNOSIS — Z992 Dependence on renal dialysis: Secondary | ICD-10-CM | POA: Diagnosis not present

## 2024-06-18 DIAGNOSIS — Z992 Dependence on renal dialysis: Secondary | ICD-10-CM | POA: Diagnosis not present

## 2024-06-18 DIAGNOSIS — N186 End stage renal disease: Secondary | ICD-10-CM | POA: Diagnosis not present

## 2024-06-18 DIAGNOSIS — N2581 Secondary hyperparathyroidism of renal origin: Secondary | ICD-10-CM | POA: Diagnosis not present

## 2024-06-19 ENCOUNTER — Encounter: Payer: Self-pay | Admitting: Neurology

## 2024-06-19 DIAGNOSIS — N2581 Secondary hyperparathyroidism of renal origin: Secondary | ICD-10-CM | POA: Diagnosis not present

## 2024-06-19 DIAGNOSIS — Z992 Dependence on renal dialysis: Secondary | ICD-10-CM | POA: Diagnosis not present

## 2024-06-19 DIAGNOSIS — N186 End stage renal disease: Secondary | ICD-10-CM | POA: Diagnosis not present

## 2024-06-20 DIAGNOSIS — N2581 Secondary hyperparathyroidism of renal origin: Secondary | ICD-10-CM | POA: Diagnosis not present

## 2024-06-20 DIAGNOSIS — Z992 Dependence on renal dialysis: Secondary | ICD-10-CM | POA: Diagnosis not present

## 2024-06-20 DIAGNOSIS — N186 End stage renal disease: Secondary | ICD-10-CM | POA: Diagnosis not present

## 2024-06-20 NOTE — Patient Outreach (Signed)
 RNCM un-enrolled patient from CCM services per procedure due to 3 unsuccessful attempts to contact for reschedule. PCP Comer Gaskins NP notified.

## 2024-06-21 ENCOUNTER — Other Ambulatory Visit: Payer: Self-pay

## 2024-06-21 DIAGNOSIS — Z992 Dependence on renal dialysis: Secondary | ICD-10-CM | POA: Diagnosis not present

## 2024-06-21 DIAGNOSIS — N2581 Secondary hyperparathyroidism of renal origin: Secondary | ICD-10-CM | POA: Diagnosis not present

## 2024-06-21 DIAGNOSIS — I739 Peripheral vascular disease, unspecified: Secondary | ICD-10-CM

## 2024-06-21 DIAGNOSIS — N186 End stage renal disease: Secondary | ICD-10-CM | POA: Diagnosis not present

## 2024-06-22 DIAGNOSIS — N2581 Secondary hyperparathyroidism of renal origin: Secondary | ICD-10-CM | POA: Diagnosis not present

## 2024-06-22 DIAGNOSIS — N186 End stage renal disease: Secondary | ICD-10-CM | POA: Diagnosis not present

## 2024-06-22 DIAGNOSIS — Z992 Dependence on renal dialysis: Secondary | ICD-10-CM | POA: Diagnosis not present

## 2024-06-23 DIAGNOSIS — Z992 Dependence on renal dialysis: Secondary | ICD-10-CM | POA: Diagnosis not present

## 2024-06-23 DIAGNOSIS — N186 End stage renal disease: Secondary | ICD-10-CM | POA: Diagnosis not present

## 2024-06-23 DIAGNOSIS — N2581 Secondary hyperparathyroidism of renal origin: Secondary | ICD-10-CM | POA: Diagnosis not present

## 2024-06-24 DIAGNOSIS — N2581 Secondary hyperparathyroidism of renal origin: Secondary | ICD-10-CM | POA: Diagnosis not present

## 2024-06-24 DIAGNOSIS — N186 End stage renal disease: Secondary | ICD-10-CM | POA: Diagnosis not present

## 2024-06-24 DIAGNOSIS — Z992 Dependence on renal dialysis: Secondary | ICD-10-CM | POA: Diagnosis not present

## 2024-06-25 DIAGNOSIS — Z992 Dependence on renal dialysis: Secondary | ICD-10-CM | POA: Diagnosis not present

## 2024-06-25 DIAGNOSIS — N186 End stage renal disease: Secondary | ICD-10-CM | POA: Diagnosis not present

## 2024-06-25 DIAGNOSIS — N2581 Secondary hyperparathyroidism of renal origin: Secondary | ICD-10-CM | POA: Diagnosis not present

## 2024-06-26 DIAGNOSIS — H524 Presbyopia: Secondary | ICD-10-CM | POA: Diagnosis not present

## 2024-06-26 DIAGNOSIS — H5203 Hypermetropia, bilateral: Secondary | ICD-10-CM | POA: Diagnosis not present

## 2024-06-26 DIAGNOSIS — H52223 Regular astigmatism, bilateral: Secondary | ICD-10-CM | POA: Diagnosis not present

## 2024-06-26 DIAGNOSIS — N186 End stage renal disease: Secondary | ICD-10-CM | POA: Diagnosis not present

## 2024-06-26 DIAGNOSIS — N2581 Secondary hyperparathyroidism of renal origin: Secondary | ICD-10-CM | POA: Diagnosis not present

## 2024-06-26 DIAGNOSIS — Z992 Dependence on renal dialysis: Secondary | ICD-10-CM | POA: Diagnosis not present

## 2024-06-27 ENCOUNTER — Telehealth: Payer: Self-pay

## 2024-06-27 ENCOUNTER — Other Ambulatory Visit: Payer: Self-pay

## 2024-06-27 VITALS — BP 142/86 | Wt 174.0 lb

## 2024-06-27 DIAGNOSIS — Z599 Problem related to housing and economic circumstances, unspecified: Secondary | ICD-10-CM

## 2024-06-27 DIAGNOSIS — Z992 Dependence on renal dialysis: Secondary | ICD-10-CM | POA: Diagnosis not present

## 2024-06-27 DIAGNOSIS — N186 End stage renal disease: Secondary | ICD-10-CM | POA: Diagnosis not present

## 2024-06-27 DIAGNOSIS — N2581 Secondary hyperparathyroidism of renal origin: Secondary | ICD-10-CM | POA: Diagnosis not present

## 2024-06-27 NOTE — Patient Instructions (Signed)
 Visit Information  Thank you for taking time to visit with me today. Please don't hesitate to contact me if I can be of assistance to you before our next scheduled appointment.  Our next appointment is by telephone on 07/09/2024 at 2:30 pm Please call the care guide team at [phone] if you need to cancel or reschedule your appointment.   Following is a copy of your care plan:   Goals Addressed             This Visit's Progress    VBCI RN Care Plan- ESRD   On track    Problems:  Chronic Disease Management support and education needs related to ESRD  Goal: Over the next 3 months the Patient will attend all scheduled medical appointments: with providers as evidenced by patient report/ chart review.         continue to work with Medical illustrator and/or Social Worker to address care management and care coordination needs related to ESRD as evidenced by adherence to care management team scheduled appointments     demonstrate Ongoing adherence to prescribed treatment plan for ESRD as evidenced by patient report/ chart review.  take all medications exactly as prescribed and will call provider for medication related questions as evidenced by patient report /chart review.      Interventions:  Discussed signs/ symptoms of infection. Advised to monitor for signs of infection at peritoneal dialysis site and report such symptoms to provider as soon as possible. Advised to monitor blood pressure and record readings, calling patient for blood pressures outside of established parameters. Advised to follow a low salt diet.  Assessed for symptoms/ concerns Advised to monitor weight daily RNCM contacted PCP re nausea affecting appetite and interest in PT for back pain BSW referral for utility needs - throat for shut off in October 2025 Advance Directive information sent as requested   Patient Self-Care Activities:  Attend all scheduled provider appointments Call pharmacy for medication refills  3-7 days in advance of running out of medications Call provider office for new concerns or questions  Take medications as prescribed   Notify provider for any signs of infection Follow low salt diet.  Monitor blood pressure daily and record reporting blood pressure readings outside of established parameter to provider when noticed.  Plan:  Telephone follow up appointment with care management team member scheduled for:  07/09/2022 at 2:30 pm             Please call the Suicide and Crisis Lifeline: 988 call the [place]  National Suicide Prevention Lifeline: 330-738-0230 or TTY: 337 295 3637 TTY 254-078-0916) to talk to a trained counselor call 1-800-273-TALK (toll free, 24 hour hotline) go to [facility] [address] [place] [phone]) call 911 if you are experiencing a Mental Health or Behavioral Health Crisis or need someone to talk to.  Patient verbalizes understanding of instructions and care plan provided today and agrees to view in MyChart. Active MyChart status and patient understanding of how to access instructions and care plan via MyChart confirmed with patient.     Felicia Duos, MSN, RN [place]  [facility] [employer], [facility] RN Care Manager Direct Dial: [phone] Fax: [phone]  Eating Plan for Peritoneal Dialysis When you get peritoneal dialysis (PD), you need to watch what you eat. This is because PD removes extra fluid and waste from your body but doesn't keep nutrients in balance. Some nutrients can build up in your blood and make you sick. Other  key nutrients might be removed during PD. Blood tests will be done to watch your nutrient levels. Below are tips on eating well while getting PD. Your treatment team or a food expert called a dietitian will teach you more about eating well. You'll be told about: What you should eat. What foods and drinks you should limit or avoid. How much liquid to  drink each day. Meal planning. Keeping a diary of what you eat and drink. Tracking your weight to watch for fluid changes. Helpful tips Read food labels  Talk with your treatment team about what you should look for on food labels. You'll be told about foods you should limit. For instance, you might need to check food labels for salt, or sodium, content. There tends to be a lot of salt in processed or cured meats, frozen meals, canned vegetables, and salty snack foods. Salt is also in ketchup, mustard, soy sauce, and some seasonings. You may need to look for no salt added or low sodium labels. PD may not remove enough phosphorus, so you may also need to limit foods with added phosphorus. Look at the ingredients lists on food labels. If you see words with phos in them, phosphorus has been added. Try to avoid these foods. Focus on protein You need to replace the protein that you lose during PD. Not eating enough protein may cause loss of muscle mass. It can also weaken your disease-fighting system, or immune system. A weak immune system can raise your risk of infection. Low protein levels can cause your body to hold on to extra fluid, too. Limit liquids Your team will talk with you about how much to drink each day. You may be told to: Write down what you drink and how much. Write down the foods you eat that are mostly water . These include gelatin, ice cream, soups, and juicy fruits and vegetables. Drink from small cups. Vitamins PD removes a lot of key vitamins from your body. Your health care provider will give you a prescription vitamin to take every day. Do not take vitamins you can buy over the counter. They're not made for people on dialysis and can cause harm. Watch your weight You might gain weight while on PD. This is because the PD solution contains sugar, called dextrose , so you're getting extra calories. Talk with your treatment team about the amount of sugar in your solution and  how you can limit weight gain. Watch your cholesterol levels PD can cause the cholesterol and triglycerides in your blood to go up. You may need to limit the amount of fat you eat, cut back on foods with added sugars, and stop drinking alcohol . These changes can also help keep your weight under control. Plan your meals Talk with your dietitian about what type and amount of food to eat. Most people on dialysis should try to eat: 6-11 servings of grains each day. One serving = 1 slice of bread or  cup of cooked rice or pasta. 2-3 servings of vegetables each day. One serving =  cup. 2-3 servings of fruits each day. One serving =  cup. Protein, such as meat, poultry, fish, eggs, beans, or lentils. Ask what types and amounts of protein to eat. -1 cup (118-236 mL) of dairy each day. What foods should I eat? Fruits Apples. Fresh or frozen berries. Fresh or canned pears and pineapple. Grapes. Plums. Vegetables Fresh or frozen broccoli, carrots, and green beans. Cabbage. Cauliflower. Celery. Cucumbers. Eggplant. Radishes. Grains Whole-grain breads, cereals,  pasta, and crackers. Meats and other proteins Fresh or frozen beef, pork, poultry, and fish. Eggs. Dried beans, peas, and lentils. Tofu. Dairy Limited amounts of milk, half-and-half, yogurt, pudding, ice cream, or natural cheese, like cheddar, mozzarella, or Swiss. Beverages Apple cider. Cranberry or grape juice. Lemonade. Black coffee. Rice, almond, oat, or soy milk that has not had phosphorus added to it (not enriched or fortified). Seasonings and condiments Herbs. Spices. Jam and jelly. Honey. Sweets and desserts Sherbet. Cakes. Cookies. Fats and oils Olive oil, canola oil, and safflower oil. The items listed above may not be all the foods and drinks you can have. Talk with a dietitian to learn more. What foods should I limit? Fruits Do not eat starfruit. It can be toxic for people with kidney problems. Vegetables Any that your  dietitian says to limit. Grains White or processed grains. Meats and other proteins Precooked, canned, smoked, and cured meats. Packaged meat. Sardines. Dairy Processed cheese (American cheese and soft cheeses that are not refrigerated). Cheese spreads and dips. Beverages Orange juice. Prune juice. Fizzy dark sodas or soft drinks with added phosphorus or phosphates. Seasonings and condiments Salt. Salt substitutes that contain potassium. Soy sauce. Teriyaki sauce. Most coffee creamers. Sweets and desserts Ice cream. Chocolate. Candied nuts. Nondairy whipped topping. Fats and oils All are okay in limited amounts. Other foods Ready-made frozen meals, except those approved by your dietitian. Canned soups. The items listed above may not be all the foods and drinks you should limit. Talk with a dietitian to learn more. To learn more: You can learn more about peritoneal dialysis and eating well at: [facility] Kidney Diseases: StageSync.si National Kidney Foundation: kidney.org This information is not intended to replace advice given to you by your health care provider. Make sure you discuss any questions you have with your health care provider. Document Revised: 01/23/2023 Document Reviewed: 01/23/2023 Elsevier Patient Education  2024 Elsevier Inc.  Nausea, Adult Nausea is the feeling of having an upset stomach or that you are about to vomit. Nausea on its own is not usually a serious concern, but it may be an early sign of a more serious medical problem. As nausea gets worse, it can lead to vomiting. If vomiting develops, or if you are not able to drink enough fluids, you are at risk of becoming dehydrated. Dehydration can make you tired and thirsty, cause you to have a dry mouth, and decrease how often you urinate. Older adults and people with other diseases or a weak disease-fighting system (immune system) are at higher risk for dehydration. The main  goals of treating your nausea are: To relieve your nausea. To limit repeated nausea episodes. To prevent vomiting and dehydration. Follow these instructions at home: Watch your symptoms for any changes. Tell your health care provider about them. Eating and drinking     Take an oral rehydration solution (ORS). This is a drink that is sold at pharmacies and retail stores. Drink clear fluids slowly and in small amounts as you are able. Clear fluids include water , ice chips, low-calorie sports drinks, and fruit juice that has water  added (diluted fruit juice). Eat bland, easy-to-digest foods in small amounts as you are able. These foods include bananas, applesauce, rice, lean meats, toast, and crackers. Avoid drinking fluids that contain a lot of sugar or caffeine, such as energy drinks, sports drinks, and soda. Avoid alcohol . Avoid spicy or fatty foods. General instructions Take over-the-counter and prescription medicines only as told  by your health care provider. Rest at home while you recover. Drink enough fluid to keep your urine pale yellow. Breathe slowly and deeply when you feel nauseous. Avoid smelling things that have strong odors. Wash your hands often using soap and water  for at least 20 seconds. If soap and water  are not available, use hand sanitizer. Make sure that everyone in your household washes their hands well and often. Keep all follow-up visits. This is important. Contact a health care provider if: Your nausea gets worse. Your nausea does not go away after two days. You vomit multiple times. You cannot drink fluids without vomiting. You have any of the following: New symptoms. A fever. A headache. Muscle cramps. A rash. Pain while urinating. You feel light-headed or dizzy. Get help right away if: You have pain in your chest, neck, arm, or jaw. You feel extremely weak or you faint. You have vomit that is bright red or looks like coffee grounds. You have bloody  or black stools (feces) or stools that look like tar. You have a severe headache, a stiff neck, or both. You have severe pain, cramping, or bloating in your abdomen. You have difficulty breathing or are breathing very quickly. Your heart is beating very quickly. Your skin feels cold and clammy. You feel confused. You have signs of dehydration, such as: Dark urine, very little urine, or no urine. Cracked lips. Dry mouth. Sunken eyes. Sleepiness. Weakness. These symptoms may be an emergency. Get help right away. Call 911. Do not wait to see if the symptoms will go away. Do not drive yourself to the hospital. Summary Nausea is the feeling that you have an upset stomach or that you are about to vomit. Nausea on its own is not usually a serious concern, but it may be an early sign of a more serious medical problem. If vomiting develops, or if you are not able to drink enough fluids, you are at risk of becoming dehydrated. Follow recommendations for eating and drinking and take over-the-counter and prescription medicines only as told by your health care provider. Contact a health care provider right away if your symptoms worsen or you have new symptoms. Keep all follow-up visits. This is important. This information is not intended to replace advice given to you by your health care provider. Make sure you discuss any questions you have with your health care provider. Document Revised: 04/09/2021 Document Reviewed: 04/09/2021 Elsevier Patient Education  2024 [employer].  Advance Directive  Advance directives are legal papers that state your wishes about health care decisions. They let your wishes be known to family, friends, and health care providers if you become unable to speak for yourself.  You should write these papers out over time rather than all at once. They can be changed and updated at any time. The types of advance directives include: Medical power of attorney (POA). Living  wills. Do not resuscitate (DNR) or do not attempt resuscitation (DNAR) orders. What are a health care proxy and medical POA? A health care proxy is also called a health care agent. It's a person you choose to make medical decisions for you when you can't make them for yourself. In most cases, a proxy is a trusted friend or family member. A medical POA is legal paperwork that names your proxy. It may need to be: Signed. Notarized. Dated. Copied. Witnessed. Added to your medical record. You may also want to choose someone to handle your money if you can't do so. This  is called a durable POA for finances. It's separate from a medical POA. You may choose your health care proxy or someone else to act as your agent in money matters. If you don't have a proxy, or if the proxy may not be acting in your best interest, a court may choose a guardian to act on your behalf. What is a living will? A living will is legal paperwork that states your wishes about medical care. Providers should keep a copy of it in your medical record. You may want to give a copy to family members or friends. You can also keep a card in your wallet to let loved ones know you have a living will and where they can find it. A living will may be used if: You're very sick with something that will end your life. You become disabled. You can't make decisions or speak for yourself. Your living will should include whether: To use or not use life support equipment. This may include machines to filter your blood or to help you breathe. You want a DNR or DNAR order. This tells providers not to use CPR if your heart or breathing stops. To use or not use tube feeding. You want to be given foods and fluids. You want a type of comfort care called palliative care. This may be given when the goal for treatment becomes comfort rather than a cure. You want to donate your organs and tissues. A living will doesn't say what to do with your money and  property if you pass away. What is a DNR or DNAR? A DNR or DNAR order is a request not to have CPR. If you don't have one of these orders, a provider will try to help you if your heart stops or you stop breathing.  If you plan to have surgery, talk with your provider about your DNR or DNAR order. What happens if I don't have an advance directive? Each state has its own laws about advance directives. Some states assign family decision makers to act on your behalf if you don't have an advance directive.  Check with your provider, attorney, or state representative about the laws in your state. Where to find more information Each state has its own laws about advance directives. You can look up these laws at: https://rodriguez-phillips.com/ This information is not intended to replace advice given to you by your health care provider. Make sure you discuss any questions you have with your health care provider. Document Revised: 02/20/2023 Document Reviewed: 02/20/2023 Elsevier Patient Education  2024 [employer].  Fall Prevention in the Home, Adult Falls can cause injuries and affect people of all ages. There are many simple things that you can do to make your home safe and to help prevent falls. If you need it, ask for help making these changes. What actions can I take to prevent falls? General information Use good lighting in all rooms. Make sure to: Replace any light bulbs that burn out. Turn on lights if it is dark and use night-lights. Keep items that you use often in easy-to-reach places. Lower the shelves around your home if needed. Move furniture so that there are clear paths around it. Do not keep throw rugs or other things on the floor that can make you trip. If any of your floors are uneven, fix them. Add color or contrast paint or tape to clearly mark and help you see: Grab bars or handrails. First and last steps of staircases. Where the  edge of each step is. If you use a ladder or stepladder: Make  sure that it is fully opened. Do not climb a closed ladder. Make sure the sides of the ladder are locked in place. Have someone hold the ladder while you use it. Know where your pets are as you move through your home. What can I do in the bathroom?     Keep the floor dry. Clean up any water  that is on the floor right away. Remove soap buildup in the bathtub or shower. Buildup makes bathtubs and showers slippery. Use non-skid mats or decals on the floor of the bathtub or shower. Attach bath mats securely with double-sided, non-slip rug tape. If you need to sit down while you are in the shower, use a non-slip stool. Install grab bars by the toilet and in the bathtub and shower. Do not use towel bars as grab bars. What can I do in the bedroom? Make sure that you have a light by your bed that is easy to reach. Do not use any sheets or blankets on your bed that hang to the floor. Have a firm bench or chair with side arms that you can use for support when you get dressed. What can I do in the kitchen? Clean up any spills right away. If you need to reach something above you, use a sturdy step stool that has a grab bar. Keep electrical cables out of the way. Do not use floor polish or wax that makes floors slippery. What can I do with my stairs? Do not leave anything on the stairs. Make sure that you have a light switch at the top and the bottom of the stairs. Have them installed if you do not have them. Make sure that there are handrails on both sides of the stairs. Fix handrails that are broken or loose. Make sure that handrails are as long as the staircases. Install non-slip stair treads on all stairs in your home if they do not have carpet. Avoid having throw rugs at the top or bottom of stairs, or secure the rugs with carpet tape to prevent them from moving. Choose a carpet design that does not hide the edge of steps on the stairs. Make sure that carpet is firmly attached to the stairs. Fix  any carpet that is loose or worn. What can I do on the outside of my home? Use bright outdoor lighting. Repair the edges of walkways and driveways and fix any cracks. Clear paths of anything that can make you trip, such as tools or rocks. Add color or contrast paint or tape to clearly mark and help you see high doorway thresholds. Trim any bushes or trees on the main path into your home. Check that handrails are securely fastened and in good repair. Both sides of all steps should have handrails. Install guardrails along the edges of any raised decks or porches. Have leaves, snow, and ice cleared regularly. Use sand, salt, or ice melt on walkways during winter months if you live where there is ice and snow. In the garage, clean up any spills right away, including grease or oil spills. What other actions can I take? Review your medicines with your health care provider. Some medicines can make you confused or feel dizzy. This can increase your chance of falling. Wear closed-toe shoes that fit well and support your feet. Wear shoes that have rubber soles and low heels. Use a cane, walker, scooter, or crutches that help you move  around if needed. Talk with your provider about other ways that you can decrease your risk of falls. This may include seeing a physical therapist to learn to do exercises to improve movement and strength. Where to find more information Centers for Disease Control and Prevention, STEADI: TonerPromos.no [employer] on Aging: BaseRingTones.pl National Institute on Aging: BaseRingTones.pl Contact a health care provider if: You are afraid of falling at home. You feel weak, drowsy, or dizzy at home. You fall at home. Get help right away if you: Lose consciousness or have trouble moving after a fall. Have a fall that causes a head injury. These symptoms may be an emergency. Get help right away. Call 911. Do not wait to see if the symptoms will go away. Do not drive yourself to the  hospital. This information is not intended to replace advice given to you by your health care provider. Make sure you discuss any questions you have with your health care provider. Document Revised: 06/06/2022 Document Reviewed: 06/06/2022 Elsevier Patient Education  2024 [employer].

## 2024-06-27 NOTE — Patient Outreach (Signed)
 Complex Care Management   Visit Note  06/27/2024  Name:  [name] MRN: [id number] DOB: September 25, 1966  Situation: Referral received for Complex Care Management related to ESRD I obtained verbal consent from Patient.  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  Genes Analyzed (43 total): APC*, ATM*, AXIN2, BARD1, BMPR1A, BRCA1*, BRCA2*, BRIP1*, CDH1*, CDK4, CDKN2A, CHEK2*, DICER1, GALNT12, HOXB13, MEN1, MLH1*, MRE11A, MSH2*, MSH3, MSH6*, MUTYH*, NBN, NF1*, NTHL1, PALB2*, PMS2*, POLD1, POLE, PTEN*, RAD50, RAD51C*, RAD51D*, RET, SDHB, SDHD, SMAD4, SMARCA4, STK11 and TP53* (sequencing and deletion/duplication); EGFR (s   GERD 07/24/2006   GRAVE'S DISEASE 01/01/2008   History of hidradenitis suppurativa    History of kidney stones     History of thrush    HIV DISEASE 07/24/2006   dx March 05   Hyperlipidemia    HYPERTENSION 07/24/2006   Hyperthyroidism 08/2006   Grave's Disease -diffuse radiotracer uptake 08/25/06 Thyroid  scan-Cold nodule to R lower lobe of thyrorid   Ileus (HCC) 02/14/2023   Left bundle branch block (LBBB)    Malnutrition of moderate degree (HCC) 02/08/2023   Menometrorrhagia    hx of   Nephrolithiasis    Panniculitis 05/12/2020   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 Completed 2 yrs Prednisone  after lung bx confirmation. No symptoms since then.   Sebaceous cyst 04/21/2020   Suppurative hidradenitis    Thyroid  cancer (HCC)    THYROID  NODULE, RIGHT 02/08/2007    Assessment: Patient Reported Symptoms:  Cognitive Cognitive Status: Alert and oriented to person, place, and time, Insightful and able to interpret abstract concepts, Normal speech and language skills Cognitive/Intellectual Conditions Management [RPT]: None reported or documented in medical history or  problem list   Health Maintenance Behaviors: Annual physical exam, Healthy diet, Spiritual practice(s) Healing Pattern: Average  Neurological Neurological Review of Symptoms: No symptoms reported Neurological Management Strategies: Weight management, Routine screening, Fluid modification, Medication therapy, Diet modification  HEENT HEENT Symptoms Reported:  No symptoms reported HEENT Management Strategies: Routine screening    Cardiovascular Cardiovascular Symptoms Reported: No symptoms reported Cardiovascular Management Strategies: Routine screening Weight: 174 lb (78.9 kg) Cardiovascular Comment: BP daily 142/86  Respiratory Respiratory Symptoms Reported: No symptoms reported Respiratory Management Strategies: Routine screening, Medication therapy  Endocrine Endocrine Symptoms Reported: No symptoms reported Is patient diabetic?: Yes Is patient checking blood sugars at home?: Yes List most recent blood sugar readings, include date and time of day: FBG 88  Usually 80-110 denies lows/highs    Gastrointestinal Gastrointestinal Symptoms Reported: Nausea Additional Gastrointestinal Details: message to PCP - patient would like Zofran  again if possible - reports losing weight      Genitourinary Genitourinary Symptoms Reported: No symptoms reported Genitourinary Management Strategies: Peritoneal dialysis Peritoneal Dialysis Schedule: PD nightly 10 hours no issues getting supplies, site good, Nephrology monthly with labs Peritoneal Dialysis Last Treatment: 06/26/24  Integumentary Additional Integumentary Details: Hydradenitis hx, now with issue under arm like blackhead swollen and sore, no drainage, aware of sx to contact PCP/UC - Derm in January 2026 on cancellation list Skin Management Strategies: Medication therapy, Routine screening  Musculoskeletal Musculoskelatal Symptoms Reviewed: Back pain, Difficulty walking, Muscle pain, Joint pain, Weakness Additional Musculoskeletal Details: back pain impacting gait, interest in PT - note to PCP no falls though weakness at times Musculoskeletal Management Strategies: Medication therapy, Routine screening Falls in the past year?: No Number of falls in past year: 1 or less Was there an injury with Fall?: No Fall Risk Category Calculator: 0 Patient Fall Risk Level: Low Fall Risk Patient at Risk for  Falls Due to: Impaired mobility, Orthopedic patient Fall risk Follow up: Falls evaluation completed, Education provided, Falls prevention discussed  Psychosocial Psychosocial Symptoms Reported: No symptoms reported Additional Psychological Details: hallucinations for months - sees things in head like a thousand circles moving or cannot look at things like watermelon seeds - causes sudden nausea - Neuro appointment 11/12, no auditory hallucinations, made aware of LCSW servces - declines at this time Behavioral Management Strategies: Adequate rest, Support system Major Change/Loss/Stressor/Fears (CP): Medical condition, self Techniques to Cope with Loss/Stress/Change: Withdraw Quality of Family Relationships: helpful, involved, supportive Do you feel physically threatened by others?: No    06/27/2024    PHQ2-9 Depression Screening   Little interest or pleasure in doing things    Feeling down, depressed, or hopeless    PHQ-2 - Total Score    Trouble falling or staying asleep, or sleeping too much    Feeling tired or having little energy    Poor appetite or overeating     Feeling bad about yourself - or that you are a failure or have let yourself or your family down    Trouble concentrating on things, such as reading the newspaper or watching television    Moving or speaking so slowly that other people could have noticed.  Or the opposite - being so fidgety or restless that you have been moving around a lot more than usual    Thoughts that you would be better off dead, or hurting yourself in some way    PHQ2-9 Total Score    If you checked off any problems, how difficult have these problems made it for you to  do your work, take care of things at home, or get along with other people    Depression Interventions/Treatment      Vitals:   06/27/24 1132  BP: (!) 142/86    Medications Reviewed Today     Reviewed by Devra Lands, RN (Registered Nurse) on 06/27/24 at 1119  Med List Status:  <None>   Medication Order Taking? Sig Documenting Provider Last Dose Status Informant  acetaminophen  (TYLENOL ) 500 MG tablet [id number] Yes Take 1,000 mg by mouth every 6 (six) hours as needed for mild pain. [provider]  Active Self  albuterol  (VENTOLIN  HFA) 108 (90 Base) MCG/ACT inhaler [id number] Yes INHALE 2 PUFFS INTO THE LUNGS UP TO EVERY 6HRS AS NEEDED FOR WHEEZING/SHORTNESS OF BREATH Clark, Katherine K, NP  Active Self  allopurinol  (ZYLOPRIM ) 100 MG tablet [id number] Yes TAKE 1 TABLET (100 MG TOTAL) BY MOUTH DAILY. FOR GOUT PREVENTION Clark, Katherine K, NP  Active   amLODipine  (NORVASC ) 10 MG tablet [id number] Yes Take 10 mg by mouth daily. [provider]  Active Self  aspirin  EC 81 MG tablet [id number] Yes Take 81 mg by mouth daily. [provider]  Active Self  AURYXIA  1 GM 210 MG(Fe) tablet [id number] Yes Take 210 mg by mouth 3 (three) times daily with meals. [provider]  Active Self  Blood Glucose Monitoring Suppl (FREESTYLE LITE) w/Device KIT [id number] Yes Inject 1 each into the skin daily. Raford Riggs, MD  Active Self  busPIRone  (BUSPAR ) 5 MG tablet [id number] Yes Take 1 tablet (5 mg total) by mouth 2 (two) times daily. For anxiety Clark, Katherine K, NP  Active   carvedilol  (COREG ) 12.5 MG tablet [id number] Yes Take 12.5 mg by mouth 2 (two) times daily. [provider]  Active Self  cyclobenzaprine  (FLEXERIL ) 10 MG tablet [id number]  Take 1 tablet (10 mg total) by mouth 3 (three) times daily as needed for muscle spasms.  Patient not taking: Reported on 06/27/2024   Clark, Katherine K, NP  Active   dolutegravir  ([name] ) 50 MG tablet [id number]  Take 1 tablet (50 mg total) by mouth daily.  Patient not taking: Reported on 06/27/2024   Melvenia Corean SAILOR, NP  Active   emtricitabine -tenofovir  AF (DESCOVY ) 200-25 MG tablet [id number] Yes Take 1 tablet by mouth daily. Melvenia Corean SAILOR, NP  Active   fluticasone  (FLOVENT  HFA) 110 MCG/ACT  inhaler [id number] Yes Inhale 1 puff into the lungs 2 (two) times daily. [provider]  Active Self           Med Note KERRIN, MELISSA R   Fri Dec 15, 2023 12:45 PM)    Lancets JANETT ULTRASOFT) lancets [id number] Yes Use as instructed to test blood sugar daily Clark, Katherine K, NP  Active   levocetirizine (XYZAL ) 5 MG tablet [id number] Yes TAKE 1 TABLET BY MOUTH EVERY DAY IN THE EVENING FOR ALLERGIES Clark, Katherine K, NP  Active   lidocaine  (LIDODERM ) 5 % [id number] Yes PLACE 1 PATCH ONTO THE SKIN DAILY. REMOVE & DISCARD PATCH WITHIN 12 HOURS OR AS DIRECTED BY MD Clark, Katherine K, NP  Active   losartan  (COZAAR ) 50 MG tablet [id number] Yes TAKE 1 TABLET (50 MG TOTAL) BY MOUTH DAILY FOR BLOOD PRESSURE. Clark, Katherine K, NP  Active   minocycline  (MINOCIN ) 100 MG capsule [id number] Yes Take 1 capsule (100 mg total) by mouth 2 (two) times daily. Melvenia Corean SAILOR, NP  Active   omeprazole  (PRILOSEC) 40 MG capsule [id number] Yes Take 1 capsule (40 mg  total) by mouth daily. for heartburn. Gretta Comer POUR, NP  Active   rosuvastatin  (CRESTOR ) 10 MG tablet [id number] Yes TAKE 1 TABLET BY MOUTH EVERY DAY FOR CHOLESTEROL Clark, Katherine K, NP  Active   saxagliptin  HCl (ONGLYZA) 2.5 MG TABS tablet [id number] Yes Take 1 tablet (2.5 mg total) by mouth daily. for diabetes. Gretta Comer POUR, NP  Active   SYNTHROID  150 MCG tablet [id number] Yes TAKE 1 TABLET BY MOUTH DAILY BEFORE BREAKFAST. Trixie File, MD  Active   traMADol  (ULTRAM ) 50 MG tablet [id number] Yes Take 1 tablet (50 mg total) by mouth every 12 (twelve) hours as needed. Yolande Lamar BROCKS, MD  Active             Recommendation:   PCP Follow-up Specialty provider follow-up Neurology 08/28/2024 Continue Current Plan of Care  Follow Up Plan:   Telephone follow-up in 1 month  Nestora Duos, MSN, RN [name]  [facility], [facility] RN Care Manager Direct Dial: [phone] Fax:  [phone]

## 2024-06-28 ENCOUNTER — Telehealth: Payer: Self-pay | Admitting: Primary Care

## 2024-06-28 DIAGNOSIS — R110 Nausea: Secondary | ICD-10-CM

## 2024-06-28 DIAGNOSIS — N2581 Secondary hyperparathyroidism of renal origin: Secondary | ICD-10-CM | POA: Diagnosis not present

## 2024-06-28 DIAGNOSIS — N186 End stage renal disease: Secondary | ICD-10-CM | POA: Diagnosis not present

## 2024-06-28 DIAGNOSIS — G8929 Other chronic pain: Secondary | ICD-10-CM

## 2024-06-28 DIAGNOSIS — Z992 Dependence on renal dialysis: Secondary | ICD-10-CM | POA: Diagnosis not present

## 2024-06-28 MED ORDER — ONDANSETRON 4 MG PO TBDP: 4.0000 mg | ORAL_TABLET | Freq: Three times a day (TID) | ORAL | 0 refills | Status: DC | PRN

## 2024-06-28 NOTE — Patient Outreach (Signed)
 RMCM notified of message received from provider regarding requests during visit: ___ A prescription for Zofran  will be sent to pharmacy to use as needed. She should use this sparingly.   She should be evaluated for her nausea. Does she see GI? Has she discussed her nausea with any provider?   A referral was placed to physical therapy for her back pain.    She will either be contacted via phone regarding her referral to PT, or she may receive a letter on her MyChart portal from our referral team with instructions for scheduling an appointment. Please let us  know if she have not been contacted by anyone within two [name]. ___ Patient verbalized understanding. She does not have a GI provider and will contact PCP Monday for appointment regarding nausea.

## 2024-06-28 NOTE — Telephone Encounter (Addendum)
-----   Message from Nurse Nestora ORN sent at 06/27/2024 12:26 PM EDT ----- Hello, I had an initial call with Ms [name]. She is having issues with nausea affecting her intake and she has lost weight. She is requesting Zofran  - she has used in the past. She is also having issues with back pain, impacting ability to ambulate and is requesting PT. Please let me know if questions. Thank you.    Hello!  A prescription for Zofran  will be sent to pharmacy to use as needed. She should use this sparingly.  She should be evaluated for her nausea. Does she see GI? Has she discussed her nausea with any provider?  A referral was placed to physical therapy for her back pain.   She will either be contacted via phone regarding her referral to PT, or she may receive a letter on her MyChart portal from our referral team with instructions for scheduling an appointment. Please let us  know if she have not been contacted by anyone within two weeks.  Felicia Gaskins, NP-C

## 2024-06-28 NOTE — Telephone Encounter (Signed)
 Thank you! Please let me know if she sees GI or has discussed her nausea with other providers during your next phone encounter.

## 2024-06-29 DIAGNOSIS — N2581 Secondary hyperparathyroidism of renal origin: Secondary | ICD-10-CM | POA: Diagnosis not present

## 2024-06-29 DIAGNOSIS — Z992 Dependence on renal dialysis: Secondary | ICD-10-CM | POA: Diagnosis not present

## 2024-06-29 DIAGNOSIS — N186 End stage renal disease: Secondary | ICD-10-CM | POA: Diagnosis not present

## 2024-06-30 DIAGNOSIS — N186 End stage renal disease: Secondary | ICD-10-CM | POA: Diagnosis not present

## 2024-06-30 DIAGNOSIS — N2581 Secondary hyperparathyroidism of renal origin: Secondary | ICD-10-CM | POA: Diagnosis not present

## 2024-06-30 DIAGNOSIS — Z992 Dependence on renal dialysis: Secondary | ICD-10-CM | POA: Diagnosis not present

## 2024-07-01 DIAGNOSIS — Z992 Dependence on renal dialysis: Secondary | ICD-10-CM | POA: Diagnosis not present

## 2024-07-01 DIAGNOSIS — N2581 Secondary hyperparathyroidism of renal origin: Secondary | ICD-10-CM | POA: Diagnosis not present

## 2024-07-01 DIAGNOSIS — N186 End stage renal disease: Secondary | ICD-10-CM | POA: Diagnosis not present

## 2024-07-02 DIAGNOSIS — N2581 Secondary hyperparathyroidism of renal origin: Secondary | ICD-10-CM | POA: Diagnosis not present

## 2024-07-02 DIAGNOSIS — N186 End stage renal disease: Secondary | ICD-10-CM | POA: Diagnosis not present

## 2024-07-02 DIAGNOSIS — Z992 Dependence on renal dialysis: Secondary | ICD-10-CM | POA: Diagnosis not present

## 2024-07-03 DIAGNOSIS — N2581 Secondary hyperparathyroidism of renal origin: Secondary | ICD-10-CM | POA: Diagnosis not present

## 2024-07-03 DIAGNOSIS — Z992 Dependence on renal dialysis: Secondary | ICD-10-CM | POA: Diagnosis not present

## 2024-07-03 DIAGNOSIS — N186 End stage renal disease: Secondary | ICD-10-CM | POA: Diagnosis not present

## 2024-07-04 ENCOUNTER — Telehealth: Payer: Self-pay | Admitting: *Deleted

## 2024-07-04 DIAGNOSIS — N186 End stage renal disease: Secondary | ICD-10-CM | POA: Diagnosis not present

## 2024-07-04 DIAGNOSIS — N2581 Secondary hyperparathyroidism of renal origin: Secondary | ICD-10-CM | POA: Diagnosis not present

## 2024-07-04 DIAGNOSIS — Z992 Dependence on renal dialysis: Secondary | ICD-10-CM | POA: Diagnosis not present

## 2024-07-04 NOTE — Progress Notes (Signed)
 Complex Care Management Note Care Guide Note  07/04/2024 Name: [name] MRN: [id number] DOB: [birth year]  [name] is a 58 y.o. year old female who is a primary care patient of Gretta Comer POUR, NP . The community resource team was consulted for assistance with Food Insecurity and Housing   SDOH screenings and interventions completed:  Yes     SDOH Interventions Today    Flowsheet Row Most Recent Value  SDOH Interventions   Food Insecurity Interventions Community Resources Provided  [Provided food banks]  Housing Interventions Community Resources Provided  [employer] buying nneds help stretching disability no available resources]  Utilities Interventions [employer] Provided  [Provided community possibilities for assitance limited resources available]     Care guide performed the following interventions: Patient provided with information about care guide support team and interviewed to confirm resource needs.  Follow Up Plan:  No further follow up needed   Encounter Outcome:  Patient Visit Completed Marley Charlot Greenauer-Moran  [facility] HealthPopulation Health Care Guide  Direct [phone] [phone] Website: [email or web address]

## 2024-07-05 DIAGNOSIS — N2581 Secondary hyperparathyroidism of renal origin: Secondary | ICD-10-CM | POA: Diagnosis not present

## 2024-07-05 DIAGNOSIS — Z992 Dependence on renal dialysis: Secondary | ICD-10-CM | POA: Diagnosis not present

## 2024-07-05 DIAGNOSIS — N186 End stage renal disease: Secondary | ICD-10-CM | POA: Diagnosis not present

## 2024-07-06 DIAGNOSIS — N2581 Secondary hyperparathyroidism of renal origin: Secondary | ICD-10-CM | POA: Diagnosis not present

## 2024-07-06 DIAGNOSIS — Z992 Dependence on renal dialysis: Secondary | ICD-10-CM | POA: Diagnosis not present

## 2024-07-06 DIAGNOSIS — N186 End stage renal disease: Secondary | ICD-10-CM | POA: Diagnosis not present

## 2024-07-07 DIAGNOSIS — N186 End stage renal disease: Secondary | ICD-10-CM | POA: Diagnosis not present

## 2024-07-07 DIAGNOSIS — Z992 Dependence on renal dialysis: Secondary | ICD-10-CM | POA: Diagnosis not present

## 2024-07-07 DIAGNOSIS — N2581 Secondary hyperparathyroidism of renal origin: Secondary | ICD-10-CM | POA: Diagnosis not present

## 2024-07-08 DIAGNOSIS — Z992 Dependence on renal dialysis: Secondary | ICD-10-CM | POA: Diagnosis not present

## 2024-07-08 DIAGNOSIS — N186 End stage renal disease: Secondary | ICD-10-CM | POA: Diagnosis not present

## 2024-07-08 DIAGNOSIS — N2581 Secondary hyperparathyroidism of renal origin: Secondary | ICD-10-CM | POA: Diagnosis not present

## 2024-07-09 ENCOUNTER — Telehealth: Payer: Self-pay

## 2024-07-09 DIAGNOSIS — N186 End stage renal disease: Secondary | ICD-10-CM | POA: Diagnosis not present

## 2024-07-09 DIAGNOSIS — Z992 Dependence on renal dialysis: Secondary | ICD-10-CM | POA: Diagnosis not present

## 2024-07-09 DIAGNOSIS — N2581 Secondary hyperparathyroidism of renal origin: Secondary | ICD-10-CM | POA: Diagnosis not present

## 2024-07-09 NOTE — Patient Instructions (Signed)
 [name] - I am sorry I was unable to reach you today for our scheduled appointment. I work with Gretta Comer POUR, NP and am calling to support your healthcare needs. Please contact me at [phone] at your earliest convenience. I look forward to speaking with you soon.   Thank you,  Nestora Duos, MSN, RN [facility]  [facility], [facility] RN Care Manager Direct Dial: [phone] Fax: [phone]

## 2024-07-10 ENCOUNTER — Other Ambulatory Visit: Payer: Self-pay | Admitting: Internal Medicine

## 2024-07-10 DIAGNOSIS — N2581 Secondary hyperparathyroidism of renal origin: Secondary | ICD-10-CM | POA: Diagnosis not present

## 2024-07-10 DIAGNOSIS — Z992 Dependence on renal dialysis: Secondary | ICD-10-CM | POA: Diagnosis not present

## 2024-07-10 DIAGNOSIS — N186 End stage renal disease: Secondary | ICD-10-CM | POA: Diagnosis not present

## 2024-07-11 ENCOUNTER — Telehealth: Payer: Self-pay

## 2024-07-11 ENCOUNTER — Ambulatory Visit

## 2024-07-11 DIAGNOSIS — Z992 Dependence on renal dialysis: Secondary | ICD-10-CM | POA: Diagnosis not present

## 2024-07-11 DIAGNOSIS — N2581 Secondary hyperparathyroidism of renal origin: Secondary | ICD-10-CM | POA: Diagnosis not present

## 2024-07-11 DIAGNOSIS — N186 End stage renal disease: Secondary | ICD-10-CM | POA: Diagnosis not present

## 2024-07-11 NOTE — Telephone Encounter (Signed)
 Copied from CRM [id number]. Topic: Clinical - Prescription Issue >> Jul 10, 2024  4:55 PM Harlene ORN wrote: Reason for CRM: [place] - [facility]   Rep called. She was following up on a med clearance request that was sent on the 07/01/2024. Checking on the status.  Phone: [phone] Fax: [phone]

## 2024-07-11 NOTE — Telephone Encounter (Signed)
 I don't see any messages indicating a clearance form was received.   Spoke with [facility] requesting a new form be faxed to [facility] attn. Says they will fax now.

## 2024-07-11 NOTE — Telephone Encounter (Signed)
 Received faxed medical clearance form. Printed and placed form in Kelli's in-box.

## 2024-07-12 DIAGNOSIS — N2581 Secondary hyperparathyroidism of renal origin: Secondary | ICD-10-CM | POA: Diagnosis not present

## 2024-07-12 DIAGNOSIS — Z992 Dependence on renal dialysis: Secondary | ICD-10-CM | POA: Diagnosis not present

## 2024-07-12 DIAGNOSIS — N186 End stage renal disease: Secondary | ICD-10-CM | POA: Diagnosis not present

## 2024-07-12 NOTE — Telephone Encounter (Signed)
 Received completed medical clearance form back from scan center due to missing DOB. Appears this form was completed on 07/06/24 and was not scanned into chart due to missing the DOB. I have refaxed the form to the dental office and patient is aware.

## 2024-07-12 NOTE — Telephone Encounter (Signed)
 Called and spoke with patient, advised PCP is out of office until Oct. 7th and she would need OV with another provider in order to have Dental treatment clearance form filled out prior to PCP return. Patient declined stated she has a ton of appts and her back is currently out, and doesn't want to make another appt to come to office. States she will wait until Mallie returns for completion. PPW placed in [place] inbox for review and completion upon return

## 2024-07-12 NOTE — Telephone Encounter (Signed)
 Pt called to check the status of the forms. I informed her that the forms have been received. I asked her when was the deadline for the forms to be returned and she responded yesterday. Please call pt with updates on completion.

## 2024-07-13 DIAGNOSIS — N186 End stage renal disease: Secondary | ICD-10-CM | POA: Diagnosis not present

## 2024-07-13 DIAGNOSIS — N2581 Secondary hyperparathyroidism of renal origin: Secondary | ICD-10-CM | POA: Diagnosis not present

## 2024-07-13 DIAGNOSIS — Z992 Dependence on renal dialysis: Secondary | ICD-10-CM | POA: Diagnosis not present

## 2024-07-14 DIAGNOSIS — N186 End stage renal disease: Secondary | ICD-10-CM | POA: Diagnosis not present

## 2024-07-14 DIAGNOSIS — Z992 Dependence on renal dialysis: Secondary | ICD-10-CM | POA: Diagnosis not present

## 2024-07-14 DIAGNOSIS — N2581 Secondary hyperparathyroidism of renal origin: Secondary | ICD-10-CM | POA: Diagnosis not present

## 2024-07-15 DIAGNOSIS — N2581 Secondary hyperparathyroidism of renal origin: Secondary | ICD-10-CM | POA: Diagnosis not present

## 2024-07-15 DIAGNOSIS — N186 End stage renal disease: Secondary | ICD-10-CM | POA: Diagnosis not present

## 2024-07-15 DIAGNOSIS — Z992 Dependence on renal dialysis: Secondary | ICD-10-CM | POA: Diagnosis not present

## 2024-07-15 NOTE — Progress Notes (Unsigned)
 POST OPERATIVE OFFICE NOTE    CC:  F/u for surgery  HPI:  [name] is a 58 y.o. female who presents today for a postop check.  She is s/p laparoscopic insertion of peritoneal dialysis catheter on 12/21/2023 by Dr. Magda.  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  Pt returns today for follow up.  She says dialysis has tried to flush her PD catheter 4 separate times and it will not flush.  When they attempt to flush it, it causes her severe abdominal cramping.  She also endorses occasional pain in her rectum.  She is having normal bowel movements.  She denies any signs of infection at her incision sites such as erythema, tenderness, or drainage.  She currently dialyzes on MWF via [facility].   Allergies  Allergen Reactions   Genvoya [Elviteg-Cobic-Emtricit-Tenofaf] Hives   Lisinopril Cough   Aldactone  [Spironolactone ] Hives   Tegaderm Ag Mesh [Silver] Itching    Current Outpatient Medications  Medication Sig Dispense Refill   acetaminophen  (TYLENOL ) 500 MG tablet Take 1,000 mg by mouth every 6 (six) hours as needed for mild pain.     albuterol  (VENTOLIN  HFA) 108 (90 Base) MCG/ACT inhaler INHALE 2 PUFFS INTO THE LUNGS UP TO EVERY 6HRS AS NEEDED FOR WHEEZING/SHORTNESS OF BREATH 8 each 0   allopurinol  (ZYLOPRIM ) 100 MG tablet TAKE 1 TABLET (100 MG TOTAL) BY MOUTH DAILY. FOR GOUT PREVENTION 90 tablet 2   amLODipine  (NORVASC ) 10 MG tablet Take 10 mg by mouth daily.     aspirin  EC 81 MG tablet Take 81 mg by mouth daily.     AURYXIA  1 GM 210 MG(Fe) tablet Take 210 mg by mouth 3 (three) times daily with meals.     Blood Glucose Monitoring Suppl (FREESTYLE LITE) w/Device KIT Inject 1 each into the skin daily. 1 kit 0   busPIRone  (BUSPAR ) 5 MG tablet Take 1 tablet (5 mg total) by mouth 2 (two) times daily. For anxiety 180 tablet 0   carvedilol  (COREG ) 12.5 MG tablet Take 12.5 mg by mouth 2  (two) times daily.     cyclobenzaprine  (FLEXERIL ) 10 MG tablet Take 1 tablet (10 mg total) by mouth 3 (three) times daily as needed for muscle spasms. (Patient not taking: Reported on 06/27/2024) 30 tablet 0   dolutegravir  ([name] ) 50 MG tablet Take 1 tablet (50 mg total) by mouth daily. (Patient not taking: Reported on 06/27/2024) 30 tablet 11   emtricitabine -tenofovir  AF (DESCOVY ) 200-25 MG tablet Take 1 tablet by mouth daily. 30 tablet 11   fluticasone  (FLOVENT  HFA) 110 MCG/ACT inhaler Inhale 1 puff into the lungs 2 (two) times daily.     Lancets (ONETOUCH ULTRASOFT) lancets Use as instructed to test blood sugar daily 100 each 5   levocetirizine (XYZAL ) 5 MG tablet TAKE 1 TABLET BY MOUTH EVERY DAY IN THE EVENING FOR ALLERGIES 90 tablet 1   lidocaine  (LIDODERM ) 5 % PLACE 1 PATCH ONTO THE SKIN DAILY. REMOVE & DISCARD PATCH WITHIN 12 HOURS OR AS DIRECTED BY MD 30 patch 0   losartan  (COZAAR ) 50 MG tablet TAKE 1 TABLET (50 MG TOTAL) BY MOUTH DAILY FOR BLOOD PRESSURE. 90 tablet 1   minocycline  (MINOCIN ) 100 MG capsule Take 1 capsule (100 mg total) by mouth 2 (two) times daily. 60 capsule 5   omeprazole  (PRILOSEC) 40 MG capsule Take 1 capsule (40 mg total) by mouth daily. for heartburn. 90  capsule 2   ondansetron  (ZOFRAN -ODT) 4 MG disintegrating tablet Take 1 tablet (4 mg total) by mouth every 8 (eight) hours as needed for nausea or vomiting. 20 tablet 0   rosuvastatin  (CRESTOR ) 10 MG tablet TAKE 1 TABLET BY MOUTH EVERY DAY FOR CHOLESTEROL 90 tablet 2   saxagliptin  HCl (ONGLYZA) 2.5 MG TABS tablet Take 1 tablet (2.5 mg total) by mouth daily. for diabetes. 90 tablet 1   SYNTHROID  150 MCG tablet TAKE 1 TABLET BY MOUTH DAILY BEFORE BREAKFAST. 90 tablet 1   traMADol  (ULTRAM ) 50 MG tablet Take 1 tablet (50 mg total) by mouth every 12 (twelve) hours as needed. 15 tablet 0   No current facility-administered medications for this visit.     ROS:  See HPI  Physical Exam:  Incision: Laparoscopic abdominal  incisions healing appropriately without signs of infection Neuro: Alert and oriented x 4 Abdomen: Soft, nondistended.  PD catheter in place.  Slight tenderness to palpation of right lower quadrant    Assessment/Plan:  This is a 58 y.o. female who presents today for postop check  -The patient recently underwent insertion of laparoscopic PD catheter on 3/6.  She currently dialyzes through [facility] -She states dialysis has been unable to flush the PD catheter since it was placed.  She has experienced severe abdominal cramping when they have tried to flush it.  She also endorses occasional rectal pain -She denies any issues with her incisions.  All of her incisions appear well-healed without signs of infection -Her abdomen is soft and nondistended.  Her PD catheter is in place.  She does have some slight right lower quadrant tenderness -Abdominal x-ray on 3/11 shows that the PD catheter is called upon itself within the right lower quadrant.  I have discussed this case with Dr. Magda.  It is likely that the patient's PD catheter is displaced, causing her pain and inability to flush -We will schedule her for diagnostic laparoscopy with PD catheter repositioning in the OR with Dr. Magda within the next week or 2.  For the time being dialysis can continue to use her [facility].  She is agreeable to this plan  Ahmed Holster, PA-C [facility] [phone]   Clinic MD:  [place]

## 2024-07-16 ENCOUNTER — Other Ambulatory Visit: Payer: Self-pay

## 2024-07-16 ENCOUNTER — Ambulatory Visit

## 2024-07-16 DIAGNOSIS — N186 End stage renal disease: Secondary | ICD-10-CM | POA: Diagnosis not present

## 2024-07-16 DIAGNOSIS — Z992 Dependence on renal dialysis: Secondary | ICD-10-CM | POA: Diagnosis not present

## 2024-07-16 DIAGNOSIS — M5442 Lumbago with sciatica, left side: Secondary | ICD-10-CM

## 2024-07-16 DIAGNOSIS — M5459 Other low back pain: Secondary | ICD-10-CM

## 2024-07-16 DIAGNOSIS — M6281 Muscle weakness (generalized): Secondary | ICD-10-CM

## 2024-07-16 DIAGNOSIS — E1122 Type 2 diabetes mellitus with diabetic chronic kidney disease: Secondary | ICD-10-CM | POA: Diagnosis not present

## 2024-07-16 DIAGNOSIS — N2581 Secondary hyperparathyroidism of renal origin: Secondary | ICD-10-CM | POA: Diagnosis not present

## 2024-07-16 NOTE — Therapy (Signed)
 OUTPATIENT PHYSICAL THERAPY THORACOLUMBAR EVALUATION   Patient Name: [name] MRN: [id number] DOB:06-Jan-1966, 58 y.o., female Today's Date: 07/16/2024  END OF SESSION:  PT End of Session - 07/16/24 1509     Visit Number 1    Number of Visits 21    Date for Recertification  09/27/24    PT Start Time 1430    PT Stop Time 1500    PT Time Calculation (min) 30 min    Activity Tolerance Patient tolerated treatment well    Behavior During Therapy [facility] for tasks assessed/performed           Past Medical History:  Diagnosis Date   Acute pain of right shoulder due to trauma 06/08/2023   Acute pancreatitis 10/23/2021   Acute renal failure superimposed on stage 4 chronic kidney disease (HCC) 10/24/2021   Allergy    Anemia    Normocytic   Antibiotic-induced yeast infection 09/28/2020   Anxiety    Asthma    Bronchitis 2005   Bursitis of left shoulder 09/06/2019   CKD (chronic kidney disease)    Dialysis on Mon-Wed- Fri   CLASS 1-EXOPHTHALMOS-THYROTOXIC 02/08/2007   Cognitive dysfunction 02/21/2020   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  Genes Analyzed (43 total): APC*, ATM*, AXIN2, BARD1, BMPR1A, BRCA1*, BRCA2*, BRIP1*, CDH1*, CDK4, CDKN2A, CHEK2*, DICER1, GALNT12, HOXB13, MEN1, MLH1*, MRE11A, MSH2*, MSH3, MSH6*, MUTYH*, NBN, NF1*, NTHL1, PALB2*, PMS2*, POLD1, POLE, PTEN*, RAD50,  RAD51C*, RAD51D*, RET, SDHB, SDHD, SMAD4, SMARCA4, STK11 and TP53* (sequencing and deletion/duplication); EGFR (s   GERD 07/24/2006   GRAVE'S DISEASE 01/01/2008   History of hidradenitis suppurativa    History of kidney stones    History of thrush    HIV DISEASE 07/24/2006   dx March 05   Hyperlipidemia    HYPERTENSION 07/24/2006   Hyperthyroidism 08/2006   Grave's Disease -diffuse radiotracer uptake 08/25/06 Thyroid  scan-Cold nodule to R lower lobe of thyrorid   Ileus (HCC) 02/14/2023   Left bundle branch block (LBBB)    Malnutrition of moderate degree 02/08/2023   Menometrorrhagia    hx of   Nephrolithiasis    Panniculitis 05/12/2020   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 Completed 2 yrs Prednisone  after lung bx confirmation. No symptoms since then.   Sebaceous cyst 04/21/2020   Suppurative hidradenitis  Thyroid  cancer (HCC)    THYROID  NODULE, RIGHT 02/08/2007   Past Surgical History:  Procedure Laterality Date   AORTIC ARCH DEBRANCHING N/A 08/25/2023   Procedure: AORTIC ARCH DEBRANCHING USING 12X8X8MM HEMASHIELD GRAFT;  Surgeon: Lucas Dorise POUR, MD;  Location: [facility];  Service: Open Heart Surgery;  Laterality: N/A;  with bypasses to the innominate and left common carotid arteries   APPLICATION OF WOUND VAC N/A 01/20/2021   Procedure: APPLICATION OF WOUND VAC;  Surgeon: Lowery Estefana RAMAN, DO;   Location: [facility];  Service: Plastics;  Laterality: N/A;   BREAST EXCISIONAL BIOPSY Right 04/26/2018   right axilla negative   BREAST EXCISIONAL BIOPSY Left 04/26/2018   left axilla negative   BREAST LUMPECTOMY Right 10/03/2018   malignant   BREAST LUMPECTOMY WITH RADIOACTIVE SEED AND SENTINEL LYMPH NODE BIOPSY Right 10/03/2018   Procedure: RIGHT BREAST LUMPECTOMY WITH RADIOACTIVE SEED AND SENTINEL LYMPH NODE MAPPING;  Surgeon: Vanderbilt Ned, MD;  Location: [facility];  Service: General;  Laterality: Right;   BREAST SURGERY  1997   Breast Reduction    CAPD INSERTION N/A 12/21/2023   Procedure: LAPAROSCOPIC INSERTION CONTINUOUS AMBULATORY PERITONEAL DIALYSIS  (CAPD) CATHETER; LAPAROSCOPIC OMENTUMPEXY;  Surgeon: Magda Ned SAILOR, MD;  Location: [facility];  Service: Vascular;  Laterality: N/A;   CYSTOSCOPY W/ URETERAL STENT REMOVAL  11/09/2012   Procedure: CYSTOSCOPY WITH STENT REMOVAL;  Surgeon: Ricardo Likens, MD;  Location: [facility];  Service: Urology;  Laterality: Right;   CYSTOSCOPY WITH RETROGRADE PYELOGRAM, URETEROSCOPY AND STENT PLACEMENT  11/09/2012   Procedure: CYSTOSCOPY WITH RETROGRADE PYELOGRAM, URETEROSCOPY AND STENT PLACEMENT;  Surgeon: Ricardo Likens, MD;  Location: [facility];  Service: Urology;  Laterality: Left;  LEFT URETEROSCOPY, STONE MANIPULATION, left STENT exchange    CYSTOSCOPY WITH STENT PLACEMENT  10/02/2012   Procedure: CYSTOSCOPY WITH STENT PLACEMENT;  Surgeon: Ricardo Likens, MD;  Location: [facility];  Service: Urology;  Laterality: Left;   DEBRIDEMENT AND CLOSURE WOUND N/A 01/20/2021   Procedure: Excision of abdominal wound with closure;  Surgeon: Lowery Estefana RAMAN, DO;  Location: [facility];  Service: Plastics;  Laterality: N/A;   DILATION AND CURETTAGE OF UTERUS  11/2002   s/p for 1st trimester nonviable pregnancy   EYE SURGERY     sty under eyelid   INCISE AND DRAIN ABCESS  08/2002   s/p I &D for righ inframmary fold hidradenitis    INCISION AND DRAINAGE PERITONSILLAR ABSCESS  12/2001   IR CV LINE INJECTION  06/07/2018   IR FLUORO GUIDE CV LINE RIGHT  01/30/2023   IR IMAGING GUIDED PORT INSERTION  06/20/2018   IR REMOVAL TUN ACCESS W/ PORT W/O FL MOD SED  06/20/2018   IR US  GUIDE VASC ACCESS RIGHT  01/30/2023   IRRIGATION AND DEBRIDEMENT ABSCESS  01/31/2012   Procedure: IRRIGATION AND DEBRIDEMENT ABSCESS;  Surgeon: Alm VEAR Angle, MD;  Location: [facility];  Service: General;  Laterality: Right;  right breast and axilla    LAPAROSCOPIC REPOSITIONING CAPD CATHETER Left 01/10/2024   Procedure: Removal of peritoneal dialysis Catheter.;  Surgeon: Magda Ned SAILOR, MD;  Location: [facility] OR;  Service: Vascular;  Laterality: Left;   LAPAROSCOPY N/A 01/10/2024   Procedure: LAPAROSCOPY, DIAGNOSTIC;  Surgeon: Magda Ned SAILOR, MD;  Location: [facility] OR;  Service: Vascular;  Laterality: N/A;   NEPHROLITHOTOMY  10/02/2012   Procedure: NEPHROLITHOTOMY PERCUTANEOUS;  Surgeon: Ricardo Likens, MD;  Location: [facility];  Service: Urology;  Laterality: Right;  First Stage Percutaneous Nephrolithotomy with Surgeon Access,  Left Ureteral Stent     NEPHROLITHOTOMY  10/04/2012   Procedure: NEPHROLITHOTOMY PERCUTANEOUS SECOND LOOK;  Surgeon: Ricardo Likens, MD;  Location: [facility];  Service: Urology;  Laterality: Right;      NEPHROLITHOTOMY  10/08/2012   Procedure: NEPHROLITHOTOMY PERCUTANEOUS;  Surgeon: Ricardo Likens, MD;  Location: [facility];  Service: Urology;  Laterality: Right;  THIRD STAGE, nephrostomy tube exchange x 2   NEPHROLITHOTOMY  10/11/2012   Procedure: NEPHROLITHOTOMY PERCUTANEOUS SECOND LOOK;  Surgeon: Ricardo Likens, MD;  Location: [facility];  Service: Urology;  Laterality: Right;  RIGHT 4 STAGE PERCUTANOUS NEPHROLITHOTOMY, right URETEROSCOPY WITH HOLMIUM LASER    PANNICULECTOMY N/A 12/21/2020   Procedure: PANNICULECTOMY;  Surgeon: Lowery Estefana RAMAN, DO;  Location: [facility];  Service: Plastics;  Laterality: N/A;   PERCUTANEOUS NEPHROSTOLITHOTOMY   04/2022   PORT-A-CATH REMOVAL N/A 07/16/2020   Procedure: REMOVAL PORT-A-CATH;  Surgeon: Vanderbilt Ned, MD;  Location: [facility];  Service: General;  Laterality: N/A;   PORTACATH PLACEMENT Left 05/17/2018   Procedure: INSERTION PORT-A-CATH;  Surgeon: Vernetta Berg, MD;  Location: [facility];  Service: General;  Laterality: Left;   RADICAL NECK DISSECTION  04/12/2017   limited/notes 04/12/2017   RADICAL NECK DISSECTION N/A 04/12/2017   Procedure: RADICAL NECK DISSECTION;  Surgeon: Carlie Clark, MD;  Location: [facility];  Service: ENT;  Laterality: N/A;  limited neck dissection 2 hours total   REDUCTION MAMMAPLASTY Bilateral 1998   RIGHT/LEFT HEART CATH AND CORONARY ANGIOGRAPHY N/A 03/12/2020   Procedure: RIGHT/LEFT HEART CATH AND CORONARY ANGIOGRAPHY;  Surgeon: Cherrie Toribio SAUNDERS, MD;  [facility];  Service: Cardiovascular;  Laterality: N/A;   [place]  1994   TEE WITHOUT CARDIOVERSION N/A 08/25/2023   Procedure: TRANSESOPHAGEAL ECHOCARDIOGRAM;  Surgeon: Lucas Dorise POUR, MD;  Location: [facility];  Service: Open Heart Surgery;  Laterality: N/A;   TENCKHOFF CATHETER INSERTION Left 01/10/2024   Procedure: INSERTION, CATHETER, DIALYSIS, PERITONEAL;  Surgeon: Magda Ned SAILOR, MD;  Location: [facility];  Service: Vascular;  Laterality: Left;   THORACIC AORTIC ENDOVASCULAR STENT GRAFT N/A 02/02/2023   Procedure: THORACIC AORTIC ENDOVASCULAR STENT GRAFT;  Surgeon: Lanis Fonda BRAVO, MD;  Location[facility];  Service: Vascular;  Laterality: N/A;   THORACIC AORTIC ENDOVASCULAR STENT GRAFT N/A 08/25/2023   Procedure: THORACIC AORTIC ENDOVASCULAR STENT GRAFT;  Surgeon: Lanis Fonda BRAVO, MD;  Location: [facility] OR;  Service: Vascular;  Laterality: N/A;   THYROIDECTOMY  04/12/2017   completion/notes 04/12/2017   THYROIDECTOMY N/A 04/12/2017   Procedure: THYROIDECTOMY;  Surgeon: Carlie Clark, MD;  Location: [facility] OR;  Service: ENT;  Laterality: N/A;  Completion Thyroidectomy   TOTAL  THYROIDECTOMY  2010   ULTRASOUND GUIDANCE FOR VASCULAR ACCESS Right 08/25/2023   Procedure: ULTRASOUND GUIDANCE FOR VASCULAR ACCESS, RIGHT FEMORAL ARTERY;  Surgeon: Lanis Fonda BRAVO, MD;  Location: [facility] OR;  Service: Vascular;  Laterality: Right;   Patient Active Problem List   Diagnosis Date Noted   Hallucinations 06/03/2024   Acute left-sided low back pain 03/29/2024   Localized skin mass, lump, or swelling 03/29/2024   Vaginal itching 01/18/2024   H/O aortic aneurysm repair 08/25/2023   Thoracic back pain 03/07/2023   At risk for sleep apnea 03/07/2023   Hypothyroidism 02/13/2023   History of aortic dissection 01/29/2023   Chronic pain of both knees 07/29/2022   Chronic illness 10/27/2021   Paresthesias 06/30/2021   Chronic pain of right hand 06/30/2021   Chronic low back pain 06/30/2021   Voice hoarseness 12/15/2020  Postoperative breast asymmetry 02/28/2020   Chronic gout 01/27/2020   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   CKD (chronic kidney disease) stage V requiring chronic dialysis (HCC) 07/12/2019   Normocytic anemia 07/12/2019   Preventative health care 09/10/2018   Port-A-Cath in place 07/12/2018   Family history of breast cancer    Family history of prostate cancer    Family history of lung cancer    Malignant neoplasm of lower-inner quadrant of right breast of female, estrogen receptor positive (HCC) 05/10/2018   Hyperlipidemia 04/26/2017   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   GAD (generalized anxiety disorder) 04/11/2017   Laryngopharyngeal reflux (LPR) 11/24/2016   Obesity (BMI 30-39.9) 10/14/2016   Allergic rhinitis 02/18/2016   Type 2 diabetes mellitus with hyperlipidemia (HCC) 06/23/2015   Hidradenitis suppurativa 01/30/2012   Sarcoidosis 02/08/2007   Cigarette smoker 02/08/2007   HIV (human immunodeficiency virus infection) (HCC) 07/24/2006   Depression 07/24/2006   Hypertension 07/24/2006   Asthma 07/24/2006    PCP:  Gretta Comer POUR, NP   REFERRING PROVIDER: Gretta Comer POUR, NP   REFERRING DIAG: M54.50,G89.29 (ICD-10-CM) - Chronic bilateral low back pain, unspecified whether sciatica present   Rationale for Evaluation and Treatment: Rehabilitation  THERAPY DIAG:  No diagnosis found.  ONSET DATE: couple months ago  SUBJECTIVE:                                                                                                                                                                                           SUBJECTIVE STATEMENT:  Pt reports 5 months ago she started getting LBP which  came out of nowhere. Saw neurologist and had tests. Has pain in central low back which radiates to bilateral LE.  LE can have tingling and or numbness.  Denies any bowel or bladder issues. Tried tramadol  which doesn't help.  States today the pain is very high.   PERTINENT HISTORY:  See PMH above  PAIN:  NPRS scale: 9/10 Pain location: low back  Pain description: dull, strong Aggravating factors: steady pain aggravated by motion Relieving factors: nothing  PRECAUTIONS: None  WEIGHT BEARING RESTRICTIONS: No  FALLS:  Has patient fallen in last 6 months? No  LIVING ENVIRONMENT: Lives with: lives alone Lives in: House/apartment Stairs: Yes: Internal: 12 steps; can reach both Has following equipment at home: None  OCCUPATION: not working  PLOF: Independent  PATIENT GOALS: Decrease pain  Next MD Visit: unknown   OBJECTIVE:   DIAGNOSTIC FINDINGS:  FINDINGS: 03/29/24 There is no evidence of lumbar spine fracture. Alignment is normal. Mild degenerative joint changes with facet joint sclerosis noted throughout lumbar spine. Narrow  intervertebral spaces are noted in the lower lumbar spine. Vascular stent graft is identified.  PATIENT SURVEYS:  Patient-Specific Activity Scoring Scheme  0 represents "unable to perform." 10 represents "able to perform at prior level. 0 1 2 3 4 5 6 7 8 9   10 (Date and Score)   Activity Eval  07/16/24    1. cleaning ---     2. walking      3. Community activities    4.    5.    Score Do next visit    Total score = sum of the activity scores/number of activities Minimum detectable change (90%CI) for average score = 2 points Minimum detectable change (90%CI) for single activity score = 3 points  SCREENING FOR RED FLAGS: Bowel or bladder incontinence: No Cauda equina syndrome: No  COGNITION: Overall cognitive status: WFL normal      SENSATION: WFL  MUSCLE LENGTH: Hamstrings: Right mild restriction  ; Left mild restriction  POSTURE:  flexed trunk  and dropped R shoulder  PALPATION: 2+ tenderness lumbar pvm  LUMBAR ROM:   AROM/PROM Eval 07/16/24  Flexion 25%  Extension 25%  Right lateral flexion 25%  Left lateral flexion 25%  Right rotation 50%  Left rotation 50%   (Blank rows = not tested)  LOWER EXTREMITY ROM:   WFL t/o  LOWER EXTREMITY MMT:    MMT Right Eval 07/16/24 Left Eval 07/16/24  Hip flexion 4 4  Hip extension    Hip abduction 4 4  Hip adduction    Hip internal rotation    Hip external rotation    Knee flexion 4 4  Knee extension 4- 4-  Ankle dorsiflexion    Ankle plantarflexion    Ankle inversion    Ankle eversion     (Blank rows = not tested)  LUMBAR SPECIAL TESTS:  Straight leg raise test: Negative  FUNCTIONAL TESTS:  5 times sit to stand: Unable to complete -pain  GAIT:Antalgic with shortened stride and dec cadence                                                                                                                                                                                                                   TODAY'S TREATMENT:  DATE: 07/16/24  Therex:    HEP instruction/performance c cues for techniques, handout provided.  Trial set performed of each for comprehension  and symptom assessment.  See below for exercise list  PATIENT EDUCATION:  Education details: HEP, POC Person educated: Patient Education method: Explanation, Demonstration, Verbal cues, and Handouts Education comprehension: verbalized understanding, returned demonstration, and verbal cues required  HOME EXERCISE PROGRAM: Access Code: [email or web address] URL: [email or web address] Date: 07/16/2024 Prepared by: Burnard Meth  Exercises - Supine Lower Trunk Rotation  - 2 x daily - 7 x weekly - 2 sets - 10 reps - Supine Single Knee to Chest Stretch  - 2 x daily - 7 x weekly - 1 sets - 4 reps - 25 hold  ASSESSMENT:  CLINICAL IMPRESSION: Patient is a 58 y.o. who comes to clinic with complaints of low back pain with mobility, strength,pain and movement coordination deficits that impair their ability to perform usual daily and recreational functional activities without increase difficulty/symptoms at this time.  Patient to benefit from skilled PT services to address impairments and limitations to improve to previous level of function without restriction secondary to condition.   OBJECTIVE IMPAIRMENTS: decreased endurance, difficulty walking, decreased ROM, decreased strength, increased muscle spasms, and pain.   ACTIVITY LIMITATIONS: lifting, bending, sitting, standing, squatting, sleeping, and transfers  PARTICIPATION LIMITATIONS: cleaning and community activity  PERSONAL FACTORS: 3+ comorbidities: (CKD, Diabetes, Hypertension) are also affecting patient's functional outcome.   REHAB POTENTIAL: Fair See Personal Factors  CLINICAL DECISION MAKING: Stable/uncomplicated  EVALUATION COMPLEXITY: Moderate   GOALS: Goals reviewed with patient? Yes  SHORT TERM GOALS: (target date for Short term goals are 3 weeks 08/06/2024)  1. Patient will demonstrate independent use of home exercise program to maintain progress from in clinic treatments.  Goal status: New  LONG TERM GOALS: (target  dates for all long term goals are 10 weeks  09/27/2024)   1. Patient will demonstrate/report pain at worst less than or equal to 2/10 to facilitate minimal limitation in daily activity secondary to pain symptoms.  Goal status: New   2. Patient will demonstrate independent use of home exercise program to facilitate ability to maintain/progress functional gains from skilled physical therapy services.  Goal status: New   3. Patient will demonstrate Patient specific functional scale avg > or = --- to indicate reduced disability due to condition.   Goal status: New   4. Patient will demonstrate lumbar AROM increased by 25%. Goal status: New    PLAN:  PT FREQUENCY: 1-2x/week  PT DURATION: 10 weeks  PLANNED INTERVENTIONS: Can include  97110-Therapeutic exercises,  97530- Therapeutic activity,  97112- Neuromuscular re-education,  [phone]- Self Care,  97140- Manual therapy,  [phone]- Gait training, [phone]- Electrical stimulation (unattended),  97750 Physical performance testing,  [phone] - Needle insertion w/o injection 1 or 2 muscles, 20561 - Needle insertion w/o injection 3 or more muscles   Patient/Family education, Balance training, Stair training, Taping, Dry Needling, Joint mobilization, Joint manipulation, Spinal manipulation, Spinal mobilization, Scar mobilization, Vestibular training, Visual/preceptual remediation/compensation, DME instructions, Cryotherapy, and Moist heat.  All performed as medically necessary.  All included unless contraindicated  PLAN FOR NEXT SESSION: Review HEP knowledge/results. Core strengthening   Burnard Meth, PT 07/17/24  7:49 AM     Referring diagnosis? M54.50,G89.29 (ICD-10-CM) - Chronic bilateral low back pain, unspecified whether sciatica present  Treatment diagnosis? (if different than referring diagnosis)  What was this (referring dx) caused by? []  Surgery []  Fall []  Ongoing issue []  Arthritis [x]  Other: ____________  Laterality: []   Rt []  Lt [x]  Both  Check all possible CPT codes:  *CHOOSE 10 OR LESS*    See Planned Interventions listed in the Plan section of the Evaluation.

## 2024-07-17 DIAGNOSIS — D2361 Other benign neoplasm of skin of right upper limb, including shoulder: Secondary | ICD-10-CM | POA: Diagnosis not present

## 2024-07-17 DIAGNOSIS — N186 End stage renal disease: Secondary | ICD-10-CM | POA: Diagnosis not present

## 2024-07-17 DIAGNOSIS — L732 Hidradenitis suppurativa: Secondary | ICD-10-CM | POA: Diagnosis not present

## 2024-07-17 DIAGNOSIS — Z992 Dependence on renal dialysis: Secondary | ICD-10-CM | POA: Diagnosis not present

## 2024-07-17 DIAGNOSIS — N2581 Secondary hyperparathyroidism of renal origin: Secondary | ICD-10-CM | POA: Diagnosis not present

## 2024-07-18 ENCOUNTER — Ambulatory Visit (INDEPENDENT_AMBULATORY_CARE_PROVIDER_SITE_OTHER): Admitting: Vascular Surgery

## 2024-07-18 ENCOUNTER — Emergency Department (HOSPITAL_COMMUNITY)

## 2024-07-18 ENCOUNTER — Other Ambulatory Visit: Payer: Self-pay

## 2024-07-18 ENCOUNTER — Encounter (HOSPITAL_COMMUNITY): Payer: Self-pay

## 2024-07-18 ENCOUNTER — Emergency Department (HOSPITAL_COMMUNITY): Admission: EM | Admit: 2024-07-18 | Discharge: 2024-07-18 | Disposition: A | Discharge status: Home / Self Care

## 2024-07-18 ENCOUNTER — Ambulatory Visit (HOSPITAL_COMMUNITY)
Admission: RE | Admit: 2024-07-18 | Discharge: 2024-07-18 | Disposition: A | Discharge status: Home / Self Care | Source: Ambulatory Visit | Attending: Vascular Surgery | Admitting: Vascular Surgery

## 2024-07-18 DIAGNOSIS — Z7982 Long term (current) use of aspirin: Secondary | ICD-10-CM | POA: Diagnosis not present

## 2024-07-18 DIAGNOSIS — N390 Urinary tract infection, site not specified: Secondary | ICD-10-CM | POA: Insufficient documentation

## 2024-07-18 DIAGNOSIS — Z21 Asymptomatic human immunodeficiency virus [HIV] infection status: Secondary | ICD-10-CM | POA: Insufficient documentation

## 2024-07-18 DIAGNOSIS — R197 Diarrhea, unspecified: Secondary | ICD-10-CM | POA: Diagnosis not present

## 2024-07-18 DIAGNOSIS — I120 Hypertensive chronic kidney disease with stage 5 chronic kidney disease or end stage renal disease: Secondary | ICD-10-CM | POA: Insufficient documentation

## 2024-07-18 DIAGNOSIS — M5441 Lumbago with sciatica, right side: Secondary | ICD-10-CM | POA: Insufficient documentation

## 2024-07-18 DIAGNOSIS — E1122 Type 2 diabetes mellitus with diabetic chronic kidney disease: Secondary | ICD-10-CM | POA: Insufficient documentation

## 2024-07-18 DIAGNOSIS — I10 Essential (primary) hypertension: Secondary | ICD-10-CM | POA: Diagnosis not present

## 2024-07-18 DIAGNOSIS — M545 Low back pain, unspecified: Secondary | ICD-10-CM | POA: Diagnosis present

## 2024-07-18 DIAGNOSIS — I739 Peripheral vascular disease, unspecified: Secondary | ICD-10-CM

## 2024-07-18 DIAGNOSIS — D72829 Elevated white blood cell count, unspecified: Secondary | ICD-10-CM | POA: Insufficient documentation

## 2024-07-18 DIAGNOSIS — I7103 Dissection of thoracoabdominal aorta: Secondary | ICD-10-CM

## 2024-07-18 DIAGNOSIS — Z79899 Other long term (current) drug therapy: Secondary | ICD-10-CM | POA: Insufficient documentation

## 2024-07-18 DIAGNOSIS — M5442 Lumbago with sciatica, left side: Secondary | ICD-10-CM | POA: Insufficient documentation

## 2024-07-18 DIAGNOSIS — R7989 Other specified abnormal findings of blood chemistry: Secondary | ICD-10-CM | POA: Insufficient documentation

## 2024-07-18 DIAGNOSIS — N185 Chronic kidney disease, stage 5: Secondary | ICD-10-CM | POA: Diagnosis not present

## 2024-07-18 DIAGNOSIS — I70202 Unspecified atherosclerosis of native arteries of extremities, left leg: Secondary | ICD-10-CM | POA: Diagnosis not present

## 2024-07-18 DIAGNOSIS — R110 Nausea: Secondary | ICD-10-CM | POA: Diagnosis not present

## 2024-07-18 DIAGNOSIS — Z85850 Personal history of malignant neoplasm of thyroid: Secondary | ICD-10-CM | POA: Insufficient documentation

## 2024-07-18 DIAGNOSIS — R112 Nausea with vomiting, unspecified: Secondary | ICD-10-CM | POA: Diagnosis not present

## 2024-07-18 DIAGNOSIS — Z992 Dependence on renal dialysis: Secondary | ICD-10-CM | POA: Diagnosis not present

## 2024-07-18 DIAGNOSIS — Z95828 Presence of other vascular implants and grafts: Secondary | ICD-10-CM

## 2024-07-18 DIAGNOSIS — I71019 Dissection of thoracic aorta, unspecified: Secondary | ICD-10-CM | POA: Diagnosis not present

## 2024-07-18 DIAGNOSIS — I7772 Dissection of iliac artery: Secondary | ICD-10-CM | POA: Diagnosis not present

## 2024-07-18 DIAGNOSIS — I714 Abdominal aortic aneurysm, without rupture, unspecified: Secondary | ICD-10-CM | POA: Diagnosis not present

## 2024-07-18 DIAGNOSIS — R509 Fever, unspecified: Secondary | ICD-10-CM | POA: Diagnosis not present

## 2024-07-18 LAB — URINALYSIS, MICROSCOPIC (REFLEX): RBC / HPF: NONE SEEN RBC/hpf (ref 0–5)

## 2024-07-18 LAB — COMPREHENSIVE METABOLIC PANEL WITH GFR
ALT: 15 U/L (ref 0–44)
AST: 15 U/L (ref 15–41)
Albumin: 2.1 g/dL — ABNORMAL LOW (ref 3.5–5.0)
Alkaline Phosphatase: 108 U/L (ref 38–126)
Anion gap: 20 — ABNORMAL HIGH (ref 5–15)
BUN: 63 mg/dL — ABNORMAL HIGH (ref 6–20)
CO2: 22 mmol/L (ref 22–32)
Calcium: 8 mg/dL — ABNORMAL LOW (ref 8.9–10.3)
Chloride: 94 mmol/L — ABNORMAL LOW (ref 98–111)
Creatinine, Ser: 13.7 mg/dL — ABNORMAL HIGH (ref 0.44–1.00)
GFR, Estimated: 3 mL/min — ABNORMAL LOW (ref >60)
Glucose, Bld: 64 mg/dL — ABNORMAL LOW (ref 70–99)
Potassium: 3.9 mmol/L (ref 3.5–5.1)
Sodium: 136 mmol/L (ref 135–145)
Total Bilirubin: 0.5 mg/dL (ref 0.0–1.2)
Total Protein: 5.2 g/dL — ABNORMAL LOW (ref 6.5–8.1)

## 2024-07-18 LAB — URINALYSIS, ROUTINE W REFLEX MICROSCOPIC
Glucose, UA: NEGATIVE mg/dL
Hgb urine dipstick: NEGATIVE
Ketones, ur: NEGATIVE mg/dL
Nitrite: NEGATIVE
Protein, ur: 30 mg/dL — AB
Specific Gravity, Urine: 1.025 (ref 1.005–1.030)
pH: 5 (ref 5.0–8.0)

## 2024-07-18 LAB — CBC WITH DIFFERENTIAL/PLATELET
Abs Immature Granulocytes: 0.43 K/uL — ABNORMAL HIGH (ref 0.00–0.07)
Basophils Absolute: 0.1 K/uL (ref 0.0–0.1)
Basophils Relative: 1 %
Eosinophils Absolute: 0.1 K/uL (ref 0.0–0.5)
Eosinophils Relative: 1 %
HCT: 32 % — ABNORMAL LOW (ref 36.0–46.0)
Hemoglobin: 10.7 g/dL — ABNORMAL LOW (ref 12.0–15.0)
Immature Granulocytes: 4 %
Lymphocytes Relative: 13 %
Lymphs Abs: 1.5 K/uL (ref 0.7–4.0)
MCH: 36.8 pg — ABNORMAL HIGH (ref 26.0–34.0)
MCHC: 33.4 g/dL (ref 30.0–36.0)
MCV: 110 fL — ABNORMAL HIGH (ref 80.0–100.0)
Monocytes Absolute: 1.1 K/uL — ABNORMAL HIGH (ref 0.1–1.0)
Monocytes Relative: 9 %
Neutro Abs: 8.5 K/uL — ABNORMAL HIGH (ref 1.7–7.7)
Neutrophils Relative %: 72 %
Platelets: 251 K/uL (ref 150–400)
RBC: 2.91 MIL/uL — ABNORMAL LOW (ref 3.87–5.11)
RDW: 13.9 % (ref 11.5–15.5)
WBC: 11.6 K/uL — ABNORMAL HIGH (ref 4.0–10.5)
nRBC: 0 % (ref 0.0–0.2)

## 2024-07-18 LAB — I-STAT CHEM 8, ED
BUN: 89 mg/dL — ABNORMAL HIGH (ref 6–20)
Calcium, Ion: 0.85 mmol/L — CL (ref 1.15–1.40)
Chloride: 99 mmol/L (ref 98–111)
Creatinine, Ser: 14.2 mg/dL — ABNORMAL HIGH (ref 0.44–1.00)
Glucose, Bld: 95 mg/dL (ref 70–99)
HCT: 32 % — ABNORMAL LOW (ref 36.0–46.0)
Hemoglobin: 10.9 g/dL — ABNORMAL LOW (ref 12.0–15.0)
Potassium: 6.4 mmol/L (ref 3.5–5.1)
Sodium: 131 mmol/L — ABNORMAL LOW (ref 135–145)
TCO2: 25 mmol/L (ref 22–32)

## 2024-07-18 LAB — TROPONIN I (HIGH SENSITIVITY)
Troponin I (High Sensitivity): 41 ng/L — ABNORMAL HIGH (ref <18)
Troponin I (High Sensitivity): 40 ng/L — ABNORMAL HIGH (ref <18)

## 2024-07-18 MED ORDER — HYDROCODONE-ACETAMINOPHEN 5-325 MG PO TABS: 2.0000 | ORAL_TABLET | ORAL | 0 refills | Status: DC | PRN

## 2024-07-18 MED ORDER — DEXAMETHASONE SODIUM PHOSPHATE 10 MG/ML IJ SOLN
10.0000 mg | Freq: Once | INTRAMUSCULAR | Status: AC
  Administered 2024-07-18: 10 mg via INTRAVENOUS
  Filled 2024-07-18: qty 1

## 2024-07-18 MED ORDER — ONDANSETRON 4 MG PO TBDP
4.0000 mg | ORAL_TABLET | Freq: Once | ORAL | Status: AC
  Administered 2024-07-18: 4 mg via ORAL
  Filled 2024-07-18: qty 1

## 2024-07-18 MED ORDER — IOHEXOL 350 MG/ML SOLN
75.0000 mL | Freq: Once | INTRAVENOUS | Status: AC | PRN
  Administered 2024-07-18: 75 mL via INTRAVENOUS

## 2024-07-18 MED ORDER — HYDROCODONE-ACETAMINOPHEN 5-325 MG PO TABS
2.0000 | ORAL_TABLET | Freq: Once | ORAL | Status: AC
  Administered 2024-07-18: 2 via ORAL
  Filled 2024-07-18: qty 2

## 2024-07-18 MED ORDER — CEPHALEXIN 500 MG PO CAPS: 500.0000 mg | ORAL_CAPSULE | Freq: Four times a day (QID) | ORAL | 0 refills | Status: AC

## 2024-07-18 MED ORDER — PREDNISONE 10 MG (21) PO TBPK: ORAL_TABLET | Freq: Every day | ORAL | 0 refills | Status: DC

## 2024-07-18 MED ORDER — MORPHINE SULFATE (PF) 4 MG/ML IV SOLN
4.0000 mg | Freq: Once | INTRAVENOUS | Status: AC
  Administered 2024-07-18: 4 mg via INTRAVENOUS
  Filled 2024-07-18: qty 1

## 2024-07-18 MED ORDER — ONDANSETRON HCL 4 MG/2ML IJ SOLN
4.0000 mg | Freq: Once | INTRAMUSCULAR | Status: AC
  Administered 2024-07-18: 4 mg via INTRAVENOUS
  Filled 2024-07-18: qty 2

## 2024-07-18 NOTE — Discharge Instructions (Signed)
 As discussed, please follow-up with your primary care regarding continued management of your lower back pain/sciatica.

## 2024-07-18 NOTE — ED Triage Notes (Signed)
 Pt BIB [facility] from [facility] vein with c.o back pain. Hx of aortic dissection and lower back pain. Pt states back pain radiates to both legs bilaterally. Endorses N/V.

## 2024-07-18 NOTE — ED Provider Notes (Signed)
 [place] EMERGENCY DEPARTMENT AT [facility] Provider Note   CSN: [id number] Arrival date & time: 07/18/24  1524     Patient presents with: Back Pain   [name] is a 58 y.o. female who presents to the ED by EMS today secondary to nausea as well as lower back pain which has been chronic.  Has a history of aortic dissection, seen both at neurosurgery and over at her vascular surgeon today, apparently had been sent to vascular by her neurosurgeon as they could not find any spinal etiology of her back pain and was concerned for potential back pain caused by aortic dissection.  She does endorse radicular pain down the right lower extremity, denies having any bowel or bladder incontinence, denies having any shortness of breath.  Endorses a globus sensation that has been chronic, also endorses chronic nausea.  Review of previous medical diagnoses does show history of HIV, sarcoidosis, at bedtime, type 2 diabetes, GAD, papillary adenocarcinoma of the thyroid , stage V CKD with PD.    Back Pain      Prior to Admission medications   Medication Sig Start Date End Date Taking? Authorizing Provider  cephALEXin  (KEFLEX ) 500 MG capsule Take 1 capsule (500 mg total) by mouth 4 (four) times daily for 7 days. 07/18/24 07/25/24 Yes Myriam Dorn BROCKS, PA  HYDROcodone -acetaminophen  (NORCO/VICODIN) 5-325 MG tablet Take 2 tablets by mouth every 4 (four) hours as needed. 07/18/24  Yes Myriam Dorn BROCKS, PA  predniSONE  (STERAPRED UNI-PAK 21 TAB) 10 MG (21) TBPK tablet Take by mouth daily. Take 6 tabs by mouth daily  for 2 days, then 5 tabs for 2 days, then 4 tabs for 2 days, then 3 tabs for 2 days, 2 tabs for 2 days, then 1 tab by mouth daily for 2 days 07/18/24  Yes Myriam Dorn BROCKS, PA  acetaminophen  (TYLENOL ) 500 MG tablet Take 1,000 mg by mouth every 6 (six) hours as needed for mild pain.    [provider]  albuterol  (VENTOLIN  HFA) 108 (90 Base) MCG/ACT inhaler INHALE 2  PUFFS INTO THE LUNGS UP TO EVERY 6HRS AS NEEDED FOR WHEEZING/SHORTNESS OF BREATH 09/06/23   Gretta Comer POUR, NP  allopurinol  (ZYLOPRIM ) 100 MG tablet TAKE 1 TABLET (100 MG TOTAL) BY MOUTH DAILY. FOR GOUT PREVENTION 04/11/24   Clark, Katherine K, NP  amLODipine  (NORVASC ) 10 MG tablet Take 10 mg by mouth daily. 05/29/23   [provider]  aspirin  EC 81 MG tablet Take 81 mg by mouth daily.    [provider]  AURYXIA  1 GM 210 MG(Fe) tablet Take 210 mg by mouth 3 (three) times daily with meals. 04/05/23   [provider]  Blood Glucose Monitoring Suppl (FREESTYLE LITE) w/Device KIT Inject 1 each into the skin daily. 11/10/20   Raford Riggs, MD  busPIRone  (BUSPAR ) 5 MG tablet Take 1 tablet (5 mg total) by mouth 2 (two) times daily. For anxiety 06/05/24   Gretta Comer POUR, NP  carvedilol  (COREG ) 12.5 MG tablet Take 12.5 mg by mouth 2 (two) times daily. 10/20/23   [provider]  cyclobenzaprine  (FLEXERIL ) 10 MG tablet Take 1 tablet (10 mg total) by mouth 3 (three) times daily as needed for muscle spasms. Patient not taking: Reported on 06/27/2024 03/21/24   Clark, Katherine K, NP  dolutegravir  ([name] ) 50 MG tablet Take 1 tablet (50 mg total) by mouth daily. Patient not taking: Reported on 06/27/2024 05/13/24   Melvenia Corean SAILOR, NP  emtricitabine -tenofovir  AF (DESCOVY ) 200-25  MG tablet Take 1 tablet by mouth daily. 05/13/24   Melvenia Corean SAILOR, NP  fluticasone  (FLOVENT  HFA) 110 MCG/ACT inhaler Inhale 1 puff into the lungs 2 (two) times daily.    [provider]  Lancets [facility] ULTRASOFT) lancets Use as instructed to test blood sugar daily 02/15/24   Clark, Katherine K, NP  levocetirizine (XYZAL ) 5 MG tablet TAKE 1 TABLET BY MOUTH EVERY DAY IN THE EVENING FOR ALLERGIES 05/12/24   Clark, Katherine K, NP  lidocaine  (LIDODERM ) 5 % PLACE 1 PATCH ONTO THE SKIN DAILY. REMOVE & DISCARD PATCH WITHIN 12 HOURS OR AS DIRECTED BY MD 04/21/24   Clark, Katherine K, NP   losartan  (COZAAR ) 50 MG tablet TAKE 1 TABLET (50 MG TOTAL) BY MOUTH DAILY FOR BLOOD PRESSURE. 05/14/24   Clark, Katherine K, NP  minocycline  (MINOCIN ) 100 MG capsule Take 1 capsule (100 mg total) by mouth 2 (two) times daily. 03/13/24   Melvenia Corean SAILOR, NP  omeprazole  (PRILOSEC) 40 MG capsule Take 1 capsule (40 mg total) by mouth daily. for heartburn. 04/04/24   Clark, Katherine K, NP  ondansetron  (ZOFRAN -ODT) 4 MG disintegrating tablet Take 1 tablet (4 mg total) by mouth every 8 (eight) hours as needed for nausea or vomiting. 06/28/24   Clark, Katherine K, NP  rosuvastatin  (CRESTOR ) 10 MG tablet TAKE 1 TABLET BY MOUTH EVERY DAY FOR CHOLESTEROL 03/21/24   Clark, Katherine K, NP  saxagliptin  HCl (ONGLYZA) 2.5 MG TABS tablet Take 1 tablet (2.5 mg total) by mouth daily. for diabetes. 01/11/24   Clark, Katherine K, NP  SYNTHROID  150 MCG tablet TAKE 1 TABLET BY MOUTH DAILY BEFORE BREAKFAST. 03/12/24   Trixie File, MD  traMADol  (ULTRAM ) 50 MG tablet Take 1 tablet (50 mg total) by mouth every 12 (twelve) hours as needed. 03/20/24   Yolande Lamar BROCKS, MD    Allergies: Genvoya [elviteg-cobic-emtricit-tenofaf], Lisinopril, Aldactone  [spironolactone ], and Tegaderm ag mesh [silver]    Review of Systems  Gastrointestinal:  Positive for nausea and vomiting.  Musculoskeletal:  Positive for back pain.    Updated Vital Signs BP 124/71   Pulse (!) 103   Temp 97.8 F (36.6 C) (Oral)   Resp 16   LMP 03/31/2014 (LMP Unknown)   SpO2 100%   Physical Exam Vitals and nursing note reviewed.  Constitutional:      General: She is not in acute distress.    Appearance: Normal appearance.  HENT:     Head: Normocephalic and atraumatic.     Mouth/Throat:     Mouth: Mucous membranes are moist.     Pharynx: Oropharynx is clear.  Eyes:     Extraocular Movements: Extraocular movements intact.     Conjunctiva/sclera: Conjunctivae normal.     Pupils: Pupils are equal, round, and reactive to light.   Cardiovascular:     Rate and Rhythm: Normal rate and regular rhythm.     Pulses: Normal pulses.     Heart sounds: Normal heart sounds, S1 normal and S2 normal. No murmur heard.    No friction rub. No gallop.  Pulmonary:     Effort: Pulmonary effort is normal.     Breath sounds: Normal breath sounds and air entry.  Abdominal:     General: Abdomen is flat. Bowel sounds are normal.     Palpations: Abdomen is soft.  Musculoskeletal:        General: Normal range of motion.     Cervical back: Normal range of motion and neck supple.  Right lower leg: No edema.     Left lower leg: No edema.  Skin:    General: Skin is warm and dry.     Capillary Refill: Capillary refill takes less than 2 seconds.  Neurological:     General: No focal deficit present.     Mental Status: She is alert and oriented to person, place, and time. Mental status is at baseline.  Psychiatric:        Mood and Affect: Mood normal.        Behavior: Behavior normal.     (all labs ordered are listed, but only abnormal results are displayed) Labs Reviewed  CBC WITH DIFFERENTIAL/PLATELET - Abnormal; Notable for the following components:      Result Value   WBC 11.6 (*)    RBC 2.91 (*)    Hemoglobin 10.7 (*)    HCT 32.0 (*)    MCV 110.0 (*)    MCH 36.8 (*)    Neutro Abs 8.5 (*)    Monocytes Absolute 1.1 (*)    Abs Immature Granulocytes 0.43 (*)    All other components within normal limits  URINALYSIS, ROUTINE W REFLEX MICROSCOPIC - Abnormal; Notable for the following components:   APPearance HAZY (*)    Bilirubin Urine SMALL (*)    Protein, ur 30 (*)    Leukocytes,Ua MODERATE (*)    All other components within normal limits  COMPREHENSIVE METABOLIC PANEL WITH GFR - Abnormal; Notable for the following components:   Chloride 94 (*)    Glucose, Bld 64 (*)    BUN 63 (*)    Creatinine, Ser 13.70 (*)    Calcium  8.0 (*)    Total Protein 5.2 (*)    Albumin  2.1 (*)    GFR, Estimated 3 (*)    Anion gap 20 (*)     All other components within normal limits  URINALYSIS, MICROSCOPIC (REFLEX) - Abnormal; Notable for the following components:   Bacteria, UA RARE (*)    All other components within normal limits  I-STAT CHEM 8, ED - Abnormal; Notable for the following components:   Sodium 131 (*)    Potassium 6.4 (*)    BUN 89 (*)    Creatinine, Ser 14.20 (*)    Calcium , Ion 0.85 (*)    Hemoglobin 10.9 (*)    HCT 32.0 (*)    All other components within normal limits  TROPONIN I (HIGH SENSITIVITY) - Abnormal; Notable for the following components:   Troponin I (High Sensitivity) 41 (*)    All other components within normal limits  TROPONIN I (HIGH SENSITIVITY) - Abnormal; Notable for the following components:   Troponin I (High Sensitivity) 40 (*)    All other components within normal limits    EKG: None  Radiology: CT Angio Chest/Abd/Pel for Dissection W and/or W/WO Result Date: 07/18/2024 CLINICAL DATA:  Back pain history of dissection EXAM: CT ANGIOGRAPHY CHEST, ABDOMEN AND PELVIS TECHNIQUE: Non-contrast CT of the chest was initially obtained. Multidetector CT imaging through the chest, abdomen and pelvis was performed using the standard protocol during bolus administration of intravenous contrast. Multiplanar reconstructed images and MIPs were obtained and reviewed to evaluate the vascular anatomy. RADIATION DOSE REDUCTION: This exam was performed according to the departmental dose-optimization program which includes automated exposure control, adjustment of the mA and/or kV according to patient size and/or use of iterative reconstruction technique. CONTRAST:  75mL OMNIPAQUE  IOHEXOL  350 MG/ML SOLN COMPARISON:  CT 03/20/2024, 09/15/2023, multiple prior exams dating back  to January 31, 2023 FINDINGS: CTA CHEST FINDINGS Cardiovascular: Non contrasted images of the chest demonstrate no definite acute intramural hematoma. Status post thoracic aortic stent graft for known dissection. Common origin of the great  vessels at the anterior aspect of the ascending aorta portion of the graft is stable and patent. Ascending aortic diameter at the level of the pulmonary trunk measures 3.1 cm, previously 3.1 cm. Thoracic arch diameter of 4 cm, previously 4.5 cm on sagittal series. Proximal descending thoracic aorta at the level of the right pulmonary artery measures 5.2 cm on series 14 image 60, previously 5.1 cm. Mid descending thoracic aorta measures 5.3 cm, previously 5.5 cm when measured in similar fashion. Some high density and mild calcification peripheral to the stent at the mid descending thoracic aorta probably represents calcifying thrombus. Distal descending thoracic aorta measures about 5 cm on series 7, image 91, previously 5.1 cm, with decreased size of excluded aneurysm in the region. Aortic diameter at the hiatus measures 5.7 cm compared with 6.1 cm previously. Mediastinum/Nodes: Patent trachea. Surgical clips in the region of the thyroid  gland. No suspicious mediastinal lymph nodes. Esophagus is within normal limits Lungs/Pleura: No acute airspace disease, pleural effusion, or pneumothorax. Scattered areas of parenchymal scarring. Musculoskeletal: Sternotomy.  No acute osseous abnormality. Review of the MIP images confirms the above findings. 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 On 09/15/2023, the aorta diameter at this level measured 3.8 cm. Heterogeneous flow enhancement within the partially thrombosed false lumen of the upper abdominal aorta, may be from small lumbar arteries, series 7 image 131 and 132. Chronic dissection extends throughout the abdominal aorta, and involves the left common and external iliac arteries and the left common femoral artery as before.  Persistent heterogeneous flow enhancement within the false lumen. Aneurysmal dilatation of the suprarenal abdominal aorta at the level of the SMA origin measures 4.3 cm compared with 3.6 cm previously. Celiac: Arises from the true lumen and appears grossly patent. There may be mild stenosis at the origin. There is no dissection or aneurysm. SMA: Arises from the true lumen and also appears grossly patent. Renals: Single right and single left renal arteries arise from the true lumen. There appears to be moderate severe focal stenosis of the proximal left renal artery. Mild to moderate focal stenosis origin right renal artery. IMA: Appears to arise from the false lumen and appears grossly patent. Inflow: Dissection extends into the left common iliac artery and external iliac artery. Moderate diffuse stenosis of left external iliac artery from thrombosed false lumen. Some flow enhancement visualized within the false lumen at the left common femoral artery. Left internal iliac artery arises from true lumen and there is moderate focal stenosis at the origin. Right internal iliac and external iliac arteries appear grossly patent. Veins: Suboptimally assessed Review of the MIP images confirms the above findings. NON-VASCULAR Hepatobiliary: No focal liver abnormality is seen. No gallstones, gallbladder wall thickening, or biliary dilatation. Pancreas: Unremarkable. No pancreatic ductal dilatation or surrounding inflammatory changes. Spleen: Normal in size without focal abnormality. Adrenals/Urinary Tract: Adrenal glands are stable in appearance with diffuse nodular thickening but no dominant mass. Kidneys show no hydronephrosis. Cortical atrophy. Small right-sided kidney stone. The bladder is decompressed Stomach/Bowel: The stomach is nonenlarged. There is no dilated small bowel. No  acute bowel wall thickening Lymphatic: No suspicious lymph nodes Reproductive: Uterus and bilateral adnexa are unremarkable. Other: Peritoneal  dialysis catheter is present with small amount of fluid along the catheter within the subcutaneous soft tissues of the anterior abdominal wall. Catheter tip is coiled in the pelvis. Small volume free fluid within the abdomen and pelvis. New small moderate volume of free intraperitoneal gas. Musculoskeletal: No acute or suspicious osseous abnormality. Review of the MIP images confirms the above findings. IMPRESSION: 1. 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 The ascending aorta is stable in appearance with no evidence for aneurysm or acute dissection. 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 Progressively enlarging aortic diameter at the level of the distal thoraco abdominal aorta graft when compared to more remote exams and definite increasing luminal diameter of the proximal abdominal aorta distal to the stent graft as described in detail above. Persistent flow enhancement within the false lumen of the known abdominal aortic dissection. No evidence for Peri aortic stranding. No evidence for retroperitoneal hematoma. 2. Redemonstrated extension of dissection to involve the left common, and external iliac vessels and the left common femoral vessel. Similar degree of at least moderate diffuse stenosis of the left external iliac artery from thrombosed dissection. 3. Interim finding of small moderate volume of free intraperitoneal gas. The patient does have a peritoneal dialysis catheter, and it is unclear if free intraperitoneal gas is related to the dialysis catheter or recent manipulation of the catheter. Hollow viscus perforation is included in the differential but there is no abnormally inflamed bowel identified and otherwise no obvious source. Small volume free fluid within the abdomen and pelvis. Small volume of fluid surrounding the dialysis catheter within the subcutaneous soft tissues of the abdominal wall, this is a nonspecific  finding. Electronically Signed   By: Luke Bun M.D.   On: 07/18/2024 19:01     Procedures   Medications Ordered in the ED  dexamethasone  (DECADRON ) injection 10 mg (has no administration in time range)  ondansetron  (ZOFRAN ) injection 4 mg (4 mg Intravenous Given 07/18/24 1632)  morphine  (PF) 4 MG/ML injection 4 mg (4 mg Intravenous Given 07/18/24 1630)  iohexol  (OMNIPAQUE ) 350 MG/ML injection 75 mL (75 mLs Intravenous Contrast Given 07/18/24 1711)  HYDROcodone -acetaminophen  (NORCO/VICODIN) 5-325 MG per tablet 2 tablet (2 tablets Oral Given 07/18/24 2213)    Clinical Course as of 07/18/24 2218  Thu Jul 18, 2024  1647 Potassium(!!): 6.4 Noted, suspect hemolyzed sample.  Will wait on the CMP to confirm. [JG]    Clinical Course User Index [JG] Myriam Dorn BROCKS, PA                                 Medical Decision Making Amount and/or Complexity of Data Reviewed Labs: ordered. Decision-making details documented in ED Course. Radiology: ordered.  Risk Prescription drug management.   Medical Decision Making:   Angeleena Dueitt is a 58 y.o. female who presented to the ED today with lower back pain as well as nausea vomiting detailed above.    Additional history discussed with patient's family/caregivers.  External chart has been reviewed including previous labs, imaging. Patient's presentation is complicated by their history of HIV, type 2 diabetes, hypertension.  Complete initial physical exam performed, notably the patient  was alert and oriented in no apparent distress.  Physical exam is largely unremarkable.    Reviewed and confirmed  nursing documentation for past medical history, family history, social history.    Initial Assessment:   With the patient's presentation of back pain as well as nausea and vomiting, most likely diagnosis is potential acute aortic syndrome, also lumbago with associated sciatica.  Further consider acute coronary syndrome secondary to  intermittent chest discomfort and nausea/vomiting.  Rule out UTI secondary to lower back pain.   Initial Plan:  Secondary to history of dissection as well as acute back pain, obtain CT angiography of the chest abdomen pelvis to evaluate for dissection. Screening labs including CBC and Metabolic panel to evaluate for infectious or metabolic etiology of disease.  Urinalysis with reflex culture ordered to evaluate for UTI or relevant urologic/nephrologic pathology.  EKG and serial troponin to evaluate for cardiac pathology. Objective evaluation as below reviewed   Initial Study Results:   Laboratory  All laboratory results reviewed without evidence of clinically relevant pathology.   Exceptions include: Creatinine is elevated at 13.7 however this is stable baseline for patient, hemoglobin is 10.7, elevated white count of 11.6, elevated troponin of 40 however no elevations during 2-hour interval change.  Further appreciate leukocytes along with proteinuria and rare bacteria in UA.  EKG EKG was reviewed independently. Rate, rhythm, axis, intervals all examined and without medically relevant abnormality. ST segments without concerns for elevations.    Radiology:  All images reviewed independently. Agree with radiology report at this time.   CT Angio Chest/Abd/Pel for Dissection W and/or W/WO Result Date: 07/18/2024 CLINICAL DATA:  Back pain history of dissection EXAM: CT ANGIOGRAPHY CHEST, ABDOMEN AND PELVIS TECHNIQUE: Non-contrast CT of the chest was initially obtained. Multidetector CT imaging through the chest, abdomen and pelvis was performed using the standard protocol during bolus administration of intravenous contrast. Multiplanar reconstructed images and MIPs were obtained and reviewed to evaluate the vascular anatomy. RADIATION DOSE REDUCTION: This exam was performed according to the departmental dose-optimization program which includes automated exposure control, adjustment of the mA and/or  kV according to patient size and/or use of iterative reconstruction technique. CONTRAST:  75mL OMNIPAQUE  IOHEXOL  350 MG/ML SOLN COMPARISON:  CT 03/20/2024, 09/15/2023, multiple prior exams dating back to January 31, 2023 FINDINGS: CTA CHEST FINDINGS Cardiovascular: Non contrasted images of the chest demonstrate no definite acute intramural hematoma. Status post thoracic aortic stent graft for known dissection. Common origin of the great vessels at the anterior aspect of the ascending aorta portion of the graft is stable and patent. Ascending aortic diameter at the level of the pulmonary trunk measures 3.1 cm, previously 3.1 cm. Thoracic arch diameter of 4 cm, previously 4.5 cm on sagittal series. Proximal descending thoracic aorta at the level of the right pulmonary artery measures 5.2 cm on series 14 image 60, previously 5.1 cm. Mid descending thoracic aorta measures 5.3 cm, previously 5.5 cm when measured in similar fashion. Some high density and mild calcification peripheral to the stent at the mid descending thoracic aorta probably represents calcifying thrombus. Distal descending thoracic aorta measures about 5 cm on series 7, image 91, previously 5.1 cm, with decreased size of excluded aneurysm in the region. Aortic diameter at the hiatus measures 5.7 cm compared with 6.1 cm previously. Mediastinum/Nodes: Patent trachea. Surgical clips in the region of the thyroid  gland. No suspicious mediastinal lymph nodes. Esophagus is within normal limits Lungs/Pleura: No acute airspace disease, pleural effusion, or pneumothorax. Scattered areas of parenchymal scarring. Musculoskeletal: Sternotomy.  No acute osseous abnormality. Review of the MIP images confirms the above findings. 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 On 09/15/2023, the aorta diameter at this level measured 3.8 cm. Heterogeneous flow enhancement within the partially thrombosed false lumen of the upper abdominal aorta, may be from small lumbar arteries, series 7 image 131 and 132. Chronic dissection extends throughout the abdominal aorta, and involves the left common and external iliac arteries and the left common femoral artery as before. Persistent heterogeneous flow enhancement within the false lumen. Aneurysmal dilatation of the suprarenal abdominal aorta at the level of the SMA origin measures 4.3 cm compared with 3.6 cm previously. Celiac: Arises from the true lumen and appears grossly patent. There may be mild stenosis at the origin. There is no dissection or aneurysm. SMA: Arises from the true lumen and also appears grossly patent. Renals: Single right and single left renal arteries arise from the true lumen. There appears to be moderate severe focal stenosis of the proximal left renal artery. Mild to moderate focal stenosis origin right renal artery. IMA: Appears to arise from the false lumen and appears grossly patent. Inflow: Dissection extends into the left common iliac artery and external iliac artery. Moderate diffuse stenosis of left external iliac artery from thrombosed false lumen. Some flow enhancement visualized within the false lumen at the left common femoral artery. Left internal iliac artery arises from true lumen and there is moderate focal stenosis at the origin. Right internal iliac and external iliac arteries appear grossly patent. Veins: Suboptimally assessed Review of the MIP images confirms the above findings. NON-VASCULAR Hepatobiliary: No focal liver abnormality is seen. No gallstones, gallbladder wall thickening, or biliary dilatation. Pancreas: Unremarkable. No pancreatic ductal dilatation or surrounding inflammatory changes. Spleen: Normal in size without focal abnormality. Adrenals/Urinary  Tract: Adrenal glands are stable in appearance with diffuse nodular thickening but no dominant mass. Kidneys show no hydronephrosis. Cortical atrophy. Small right-sided kidney stone. The bladder is decompressed Stomach/Bowel: The stomach is nonenlarged. There is no dilated small bowel. No acute bowel wall thickening Lymphatic: No suspicious lymph nodes Reproductive: Uterus and bilateral adnexa are unremarkable. Other: Peritoneal dialysis catheter is present with small amount of fluid along the catheter within the subcutaneous soft tissues of the anterior abdominal wall. Catheter tip is coiled in the pelvis. Small volume free fluid within the abdomen and pelvis. New small moderate volume of free intraperitoneal gas. Musculoskeletal: No acute or suspicious osseous abnormality. Review of the MIP images confirms the above findings. IMPRESSION: 1. 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 The ascending aorta is stable in appearance with no evidence for aneurysm or acute dissection. 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 Progressively enlarging aortic diameter at the level of the distal thoraco abdominal aorta graft when compared to more remote exams and definite increasing luminal diameter of the proximal abdominal aorta distal to the stent graft as described in detail above. Persistent flow enhancement within the false lumen of the known abdominal aortic dissection. No evidence for Peri aortic stranding. No evidence for retroperitoneal hematoma. 2. Redemonstrated extension of dissection to involve the left common, and external iliac vessels and the left common femoral vessel. Similar degree of at least moderate diffuse stenosis of the left external iliac artery from thrombosed dissection. 3. Interim finding of small moderate volume of free intraperitoneal gas.  The patient does have a peritoneal dialysis catheter, and it is unclear if free  intraperitoneal gas is related to the dialysis catheter or recent manipulation of the catheter. Hollow viscus perforation is included in the differential but there is no abnormally inflamed bowel identified and otherwise no obvious source. Small volume free fluid within the abdomen and pelvis. Small volume of fluid surrounding the dialysis catheter within the subcutaneous soft tissues of the abdominal wall, this is a nonspecific finding. Electronically Signed   By: Luke Bun M.D.   On: 07/18/2024 19:01      Consults: Case discussed with both Dr. Magda and Dr. Silver with vascular surgery.   Reassessment and Plan:   After consultation with vascular surgery, they reviewed the scan and agree that this does not show any worsening of her vascular condition, in fact does show some improvement.  From their perspective, they do not see any cause for admission at this time.  She has improvement in her pain with pain management given here in the ED, UA does show show borderline changes suggestive of potential developing UTI.  Plan at this time is to discharge on outpatient course of cephalexin  for management of UTI, also continue outpatient pain medication as previously prescribed.  Suggest she follow-up with her primary care for the same.  Further will manage back pain with corticosteroids as she cannot tolerate NSAIDs due to advanced renal disease.  She has been given a dose of dexamethasone  and will continue with outpatient course of prednisone  for same.  This is thoroughly discussed with the patient, she understands and agrees has no further concerns at this time.       Final diagnoses:  Lower urinary tract infectious disease  Acute bilateral low back pain with bilateral sciatica    ED Discharge Orders          Ordered    cephALEXin  (KEFLEX ) 500 MG capsule  4 times daily        07/18/24 2203    predniSONE  (STERAPRED UNI-PAK 21 TAB) 10 MG (21) TBPK tablet  Daily        07/18/24 2217     HYDROcodone -acetaminophen  (NORCO/VICODIN) 5-325 MG tablet  Every 4 hours PRN        07/18/24 2217               Myriam Dorn BROCKS, PA 07/18/24 2218    Ula Prentice SAUNDERS, MD 07/19/24 [phone]

## 2024-07-20 ENCOUNTER — Other Ambulatory Visit: Payer: Self-pay | Admitting: Primary Care

## 2024-07-20 DIAGNOSIS — E1169 Type 2 diabetes mellitus with other specified complication: Secondary | ICD-10-CM

## 2024-07-21 DIAGNOSIS — N186 End stage renal disease: Secondary | ICD-10-CM | POA: Diagnosis not present

## 2024-07-21 DIAGNOSIS — Z992 Dependence on renal dialysis: Secondary | ICD-10-CM | POA: Diagnosis not present

## 2024-07-21 DIAGNOSIS — N2581 Secondary hyperparathyroidism of renal origin: Secondary | ICD-10-CM | POA: Diagnosis not present

## 2024-07-22 DIAGNOSIS — N2581 Secondary hyperparathyroidism of renal origin: Secondary | ICD-10-CM | POA: Diagnosis not present

## 2024-07-23 ENCOUNTER — Telehealth: Payer: Self-pay

## 2024-07-23 DIAGNOSIS — N186 End stage renal disease: Secondary | ICD-10-CM | POA: Diagnosis not present

## 2024-07-23 DIAGNOSIS — Z992 Dependence on renal dialysis: Secondary | ICD-10-CM | POA: Diagnosis not present

## 2024-07-23 DIAGNOSIS — N2581 Secondary hyperparathyroidism of renal origin: Secondary | ICD-10-CM | POA: Diagnosis not present

## 2024-07-23 NOTE — Telephone Encounter (Signed)
 Error

## 2024-07-23 NOTE — Telephone Encounter (Signed)
 Patient called asking about medical clearance for dental work to be performed by [facility].  Patient told that medical clearance needs to be given by Dr. Jeralyn office.  Patient aware. [facility] has also been instructed to send medical clearance to Dr. Jeralyn office.

## 2024-07-25 DIAGNOSIS — N186 End stage renal disease: Secondary | ICD-10-CM | POA: Diagnosis not present

## 2024-07-29 ENCOUNTER — Telehealth: Payer: Self-pay

## 2024-07-29 DIAGNOSIS — Z992 Dependence on renal dialysis: Secondary | ICD-10-CM | POA: Diagnosis not present

## 2024-07-29 DIAGNOSIS — N2581 Secondary hyperparathyroidism of renal origin: Secondary | ICD-10-CM | POA: Diagnosis not present

## 2024-07-29 NOTE — Telephone Encounter (Signed)
 Copied from CRM [id number]. Topic: Appointments - Appointment Scheduling >> Jul 29, 2024  1:06 PM Berneda FALCON wrote: Patient wanted to be seen for a hospital follow up (was not admitted) for chronic back pain but states she also has a cold with runny nose and sensitivity around her lip area (no swelling or numbness). She would like to be seen this week if possible. Can also be seen at [facility] if possible (it is closer) but she has an appt in [place] in the morning as well.  Patient callback is [phone] (home)

## 2024-07-30 ENCOUNTER — Ambulatory Visit: Payer: Self-pay

## 2024-07-30 ENCOUNTER — Observation Stay (HOSPITAL_COMMUNITY)
Admission: EM | Admit: 2024-07-30 | Discharge: 2024-08-04 | Disposition: A | Discharge status: Home Health | Attending: Internal Medicine | Admitting: Internal Medicine

## 2024-07-30 ENCOUNTER — Emergency Department (HOSPITAL_COMMUNITY)

## 2024-07-30 ENCOUNTER — Other Ambulatory Visit: Payer: Self-pay

## 2024-07-30 DIAGNOSIS — E1122 Type 2 diabetes mellitus with diabetic chronic kidney disease: Secondary | ICD-10-CM | POA: Insufficient documentation

## 2024-07-30 DIAGNOSIS — D649 Anemia, unspecified: Secondary | ICD-10-CM | POA: Diagnosis not present

## 2024-07-30 DIAGNOSIS — R110 Nausea: Secondary | ICD-10-CM | POA: Diagnosis not present

## 2024-07-30 DIAGNOSIS — R519 Headache, unspecified: Secondary | ICD-10-CM | POA: Diagnosis not present

## 2024-07-30 DIAGNOSIS — G43901 Migraine, unspecified, not intractable, with status migrainosus: Secondary | ICD-10-CM

## 2024-07-30 DIAGNOSIS — D72829 Elevated white blood cell count, unspecified: Secondary | ICD-10-CM | POA: Diagnosis not present

## 2024-07-30 DIAGNOSIS — Z8679 Personal history of other diseases of the circulatory system: Secondary | ICD-10-CM

## 2024-07-30 DIAGNOSIS — Z7982 Long term (current) use of aspirin: Secondary | ICD-10-CM | POA: Diagnosis not present

## 2024-07-30 DIAGNOSIS — M109 Gout, unspecified: Secondary | ICD-10-CM | POA: Insufficient documentation

## 2024-07-30 DIAGNOSIS — G43909 Migraine, unspecified, not intractable, without status migrainosus: Secondary | ICD-10-CM | POA: Diagnosis not present

## 2024-07-30 DIAGNOSIS — G4489 Other headache syndrome: Secondary | ICD-10-CM | POA: Diagnosis not present

## 2024-07-30 DIAGNOSIS — E1169 Type 2 diabetes mellitus with other specified complication: Secondary | ICD-10-CM | POA: Diagnosis present

## 2024-07-30 DIAGNOSIS — Z794 Long term (current) use of insulin: Secondary | ICD-10-CM | POA: Insufficient documentation

## 2024-07-30 DIAGNOSIS — Z79899 Other long term (current) drug therapy: Secondary | ICD-10-CM | POA: Insufficient documentation

## 2024-07-30 DIAGNOSIS — M1A9XX0 Chronic gout, unspecified, without tophus (tophi): Secondary | ICD-10-CM | POA: Diagnosis present

## 2024-07-30 DIAGNOSIS — L732 Hidradenitis suppurativa: Secondary | ICD-10-CM | POA: Diagnosis present

## 2024-07-30 DIAGNOSIS — D352 Benign neoplasm of pituitary gland: Secondary | ICD-10-CM | POA: Insufficient documentation

## 2024-07-30 DIAGNOSIS — J45909 Unspecified asthma, uncomplicated: Secondary | ICD-10-CM | POA: Diagnosis not present

## 2024-07-30 DIAGNOSIS — N186 End stage renal disease: Secondary | ICD-10-CM | POA: Diagnosis not present

## 2024-07-30 DIAGNOSIS — I6523 Occlusion and stenosis of bilateral carotid arteries: Secondary | ICD-10-CM | POA: Diagnosis not present

## 2024-07-30 DIAGNOSIS — R2689 Other abnormalities of gait and mobility: Secondary | ICD-10-CM | POA: Diagnosis not present

## 2024-07-30 DIAGNOSIS — E039 Hypothyroidism, unspecified: Secondary | ICD-10-CM | POA: Diagnosis not present

## 2024-07-30 DIAGNOSIS — M545 Low back pain, unspecified: Secondary | ICD-10-CM | POA: Diagnosis not present

## 2024-07-30 DIAGNOSIS — Z992 Dependence on renal dialysis: Secondary | ICD-10-CM | POA: Diagnosis not present

## 2024-07-30 DIAGNOSIS — E785 Hyperlipidemia, unspecified: Secondary | ICD-10-CM | POA: Diagnosis present

## 2024-07-30 DIAGNOSIS — R251 Tremor, unspecified: Secondary | ICD-10-CM | POA: Diagnosis not present

## 2024-07-30 DIAGNOSIS — Z86718 Personal history of other venous thrombosis and embolism: Secondary | ICD-10-CM | POA: Insufficient documentation

## 2024-07-30 DIAGNOSIS — R112 Nausea with vomiting, unspecified: Principal | ICD-10-CM

## 2024-07-30 DIAGNOSIS — G8929 Other chronic pain: Secondary | ICD-10-CM | POA: Diagnosis not present

## 2024-07-30 DIAGNOSIS — Z21 Asymptomatic human immunodeficiency virus [HIV] infection status: Secondary | ICD-10-CM | POA: Diagnosis present

## 2024-07-30 DIAGNOSIS — I6782 Cerebral ischemia: Secondary | ICD-10-CM | POA: Diagnosis not present

## 2024-07-30 DIAGNOSIS — F32A Depression, unspecified: Secondary | ICD-10-CM | POA: Insufficient documentation

## 2024-07-30 DIAGNOSIS — I120 Hypertensive chronic kidney disease with stage 5 chronic kidney disease or end stage renal disease: Secondary | ICD-10-CM | POA: Insufficient documentation

## 2024-07-30 LAB — CBC WITH DIFFERENTIAL/PLATELET
Basophils Absolute: 0 K/uL (ref 0.0–0.1)
Basophils Relative: 0 %
Eosinophils Absolute: 0 K/uL (ref 0.0–0.5)
Eosinophils Relative: 0 %
HCT: 31.9 % — ABNORMAL LOW (ref 36.0–46.0)
Hemoglobin: 10.8 g/dL — ABNORMAL LOW (ref 12.0–15.0)
Lymphocytes Relative: 10 %
Lymphs Abs: 1.6 K/uL (ref 0.7–4.0)
MCH: 37.2 pg — ABNORMAL HIGH (ref 26.0–34.0)
MCHC: 33.9 g/dL (ref 30.0–36.0)
MCV: 110 fL — ABNORMAL HIGH (ref 80.0–100.0)
Monocytes Absolute: 1.4 K/uL — ABNORMAL HIGH (ref 0.1–1.0)
Monocytes Relative: 9 %
Neutro Abs: 13 K/uL — ABNORMAL HIGH (ref 1.7–7.7)
Neutrophils Relative %: 81 %
Platelets: 200 K/uL (ref 150–400)
RBC: 2.9 MIL/uL — ABNORMAL LOW (ref 3.87–5.11)
RDW: 14.7 % (ref 11.5–15.5)
WBC: 16.1 K/uL — ABNORMAL HIGH (ref 4.0–10.5)
nRBC: 0 % (ref 0.0–0.2)

## 2024-07-30 LAB — COMPREHENSIVE METABOLIC PANEL WITH GFR
ALT: 90 U/L — ABNORMAL HIGH (ref 0–44)
AST: 62 U/L — ABNORMAL HIGH (ref 15–41)
Albumin: 2.6 g/dL — ABNORMAL LOW (ref 3.5–5.0)
Alkaline Phosphatase: 157 U/L — ABNORMAL HIGH (ref 38–126)
Anion gap: 20 — ABNORMAL HIGH (ref 5–15)
BUN: 79 mg/dL — ABNORMAL HIGH (ref 6–20)
CO2: 23 mmol/L (ref 22–32)
Calcium: 8.3 mg/dL — ABNORMAL LOW (ref 8.9–10.3)
Chloride: 93 mmol/L — ABNORMAL LOW (ref 98–111)
Creatinine, Ser: 13.88 mg/dL — ABNORMAL HIGH (ref 0.44–1.00)
GFR, Estimated: 3 mL/min — ABNORMAL LOW (ref >60)
Glucose, Bld: 73 mg/dL (ref 70–99)
Potassium: 4.8 mmol/L (ref 3.5–5.1)
Sodium: 136 mmol/L (ref 135–145)
Total Bilirubin: 0.6 mg/dL (ref 0.0–1.2)
Total Protein: 6.4 g/dL — ABNORMAL LOW (ref 6.5–8.1)

## 2024-07-30 LAB — RESP PANEL BY RT-PCR (RSV, FLU A&B, COVID)  RVPGX2
Influenza A by PCR: NEGATIVE
Influenza B by PCR: NEGATIVE
Resp Syncytial Virus by PCR: NEGATIVE
SARS Coronavirus 2 by RT PCR: NEGATIVE

## 2024-07-30 LAB — I-STAT CHEM 8, ED
BUN: 78 mg/dL — ABNORMAL HIGH (ref 6–20)
Calcium, Ion: 1.03 mmol/L — ABNORMAL LOW (ref 1.15–1.40)
Chloride: 97 mmol/L — ABNORMAL LOW (ref 98–111)
Creatinine, Ser: 13.6 mg/dL — ABNORMAL HIGH (ref 0.44–1.00)
Glucose, Bld: 72 mg/dL (ref 70–99)
HCT: 35 % — ABNORMAL LOW (ref 36.0–46.0)
Hemoglobin: 11.9 g/dL — ABNORMAL LOW (ref 12.0–15.0)
Potassium: 4.5 mmol/L (ref 3.5–5.1)
Sodium: 136 mmol/L (ref 135–145)
TCO2: 27 mmol/L (ref 22–32)

## 2024-07-30 MED ORDER — METOCLOPRAMIDE HCL 5 MG/ML IJ SOLN
10.0000 mg | Freq: Once | INTRAMUSCULAR | Status: AC
  Administered 2024-07-30: 10 mg via INTRAVENOUS
  Filled 2024-07-30: qty 2

## 2024-07-30 MED ORDER — IOHEXOL 350 MG/ML SOLN
75.0000 mL | Freq: Once | INTRAVENOUS | Status: AC | PRN
Start: 2024-07-30 — End: 2024-07-30
  Administered 2024-07-30: 75 mL via INTRAVENOUS

## 2024-07-30 MED ORDER — TETRACAINE HCL 0.5 % OP SOLN
1.0000 [drp] | Freq: Once | OPHTHALMIC | Status: AC
  Administered 2024-07-30: 1 [drp] via OPHTHALMIC
  Filled 2024-07-30: qty 4

## 2024-07-30 MED ORDER — LACTATED RINGERS IV BOLUS
500.0000 mL | Freq: Once | INTRAVENOUS | Status: AC
  Administered 2024-07-30: 500 mL via INTRAVENOUS

## 2024-07-30 MED ORDER — LACTATED RINGERS IV BOLUS: 1000.0000 mL | Freq: Once | INTRAVENOUS | Status: DC

## 2024-07-30 MED ORDER — HYDROMORPHONE HCL 1 MG/ML IJ SOLN
1.0000 mg | Freq: Once | INTRAMUSCULAR | Status: AC
  Administered 2024-07-30: 1 mg via INTRAVENOUS
  Filled 2024-07-30: qty 1

## 2024-07-30 MED ORDER — HYDROMORPHONE HCL 1 MG/ML IJ SOLN
0.5000 mg | Freq: Once | INTRAMUSCULAR | Status: AC
  Administered 2024-07-30: 0.5 mg via INTRAVENOUS
  Filled 2024-07-30 (×2): qty 1

## 2024-07-30 NOTE — ED Triage Notes (Signed)
 Pt BIB [facility] from home. Pt began having headache, light sensitivity, nausea, and vomiting beginning at 2am last night. Pt states it has continued throughout the day. Pt says she does not have hx of migraines.

## 2024-07-30 NOTE — ED Notes (Signed)
 Tonopen at bedside.

## 2024-07-30 NOTE — Telephone Encounter (Signed)
 FYI Only or Action Required?: FYI only for provider.  Patient was last seen in primary care on 06/05/2024 by Gretta Comer POUR, NP.  Called Nurse Triage reporting Headache.  Symptoms began today.  Interventions attempted: Nothing.  Symptoms are: unchanged.  Triage Disposition: Go to ED Now (Notify PCP)  Patient/caregiver understands and will follow disposition?: Yes         Copied from CRM [id number]. Topic: Clinical - Red Word Triage >> Jul 30, 2024  4:40 PM Jasmin G wrote: Red Word that prompted transfer to Nurse Triage: Headache that started last night around 2:00 a.m accompanied by nausea and heart racing. Pt initially called to check in if this could a sign of a stroke. Reason for Disposition  [1] SEVERE headache AND [2] sudden-onset (i.e., reaching maximum intensity within seconds to 1 hour)  Loss of vision or double vision  (Exception: Same as previously diagnosed migraines.)  Answer Assessment - Initial Assessment Questions Pt states that she has a headache that started suddenly about 2 am. She states she is unable to lift her head. She states she has had some nausea. She states she is also having blurry vision, chest fluttering, unsure of shortness of breath bc she hasn't moved.    1. LOCATION: Where does it hurt?       2. ONSET: When did the headache start? (e.g., minutes, hours, days)      2am  3. PATTERN: Does the pain come and go, or has it been constant since it started?     constant  9. OTHER SYMPTOMS: Do you have any other symptoms? (e.g., fever, stiff neck, eye pain, sore throat, cold symptoms)     Blurry vision, chest fluttering  Protocols used: Headache-A-AH

## 2024-07-30 NOTE — ED Provider Notes (Signed)
 [place] EMERGENCY DEPARTMENT AT [facility] Provider Note   CSN: [id number] Arrival date & time: 07/30/24  1750     Patient presents with: Migraine   [name] is a 58 y.o. female.   Pt is a 58 year old female with a history of aortic dissection status postrepair, HIV, HTN, DM, prior DVT, kidney stones, and ESRD on peritoneal dialysis (dialyzes nightly) who is presenting today with complaint of a headache.  She reports that she went to bed feeling normal last night and woke up at 2 AM with a headache behind both of her eyes.  She reports the headache has gradually worsened throughout the day and became severe at noon and has not gone away.  She has had nausea, blurry vision and had some trouble walking due to the pain being so bad.  She is photophobic as well.  She reports that over the last few days she has had some URI symptoms including nasal congestion and minimally productive cough.  She has not had a fever.  She denies any neck pain.  No confusion or numbness or weakness in 1 side of her body.  She denies history of headaches.  Today she is taking her normal medications and Zofran  for nausea but no pain medication.  She did report when the headache started last night she noticed that there were some floaters in both eyes which have now resolved.  The history is provided by the patient.  Migraine       Prior to Admission medications   Medication Sig Start Date End Date Taking? Authorizing Provider  acetaminophen  (TYLENOL ) 500 MG tablet Take 1,000 mg by mouth every 6 (six) hours as needed for mild pain.   Yes [provider]  albuterol  (VENTOLIN  HFA) 108 (90 Base) MCG/ACT inhaler INHALE 2 PUFFS INTO THE LUNGS UP TO EVERY 6HRS AS NEEDED FOR WHEEZING/SHORTNESS OF BREATH 09/06/23  Yes Gretta Comer POUR, NP  allopurinol  (ZYLOPRIM ) 100 MG tablet TAKE 1 TABLET (100 MG TOTAL) BY MOUTH DAILY. FOR GOUT PREVENTION 04/11/24  Yes Clark, Katherine K, NP   amLODipine  (NORVASC ) 10 MG tablet Take 10 mg by mouth daily. 05/29/23  Yes [provider]  aspirin  EC 81 MG tablet Take 81 mg by mouth daily.   Yes [provider]  AURYXIA  1 GM 210 MG(Fe) tablet Take 210 mg by mouth 3 (three) times daily with meals. 04/05/23  Yes [provider]  busPIRone  (BUSPAR ) 5 MG tablet Take 1 tablet (5 mg total) by mouth 2 (two) times daily. For anxiety 06/05/24  Yes Gretta Comer POUR, NP  carvedilol  (COREG ) 12.5 MG tablet Take 12.5 mg by mouth 2 (two) times daily. 10/20/23  Yes [provider]  emtricitabine -tenofovir  AF (DESCOVY ) 200-25 MG tablet Take 1 tablet by mouth daily. 05/13/24  Yes Melvenia Corean SAILOR, NP  fluticasone  (FLOVENT  HFA) 110 MCG/ACT inhaler Inhale 1 puff into the lungs 2 (two) times daily.   Yes [provider]  HYDROcodone -acetaminophen  (NORCO/VICODIN) 5-325 MG tablet Take 2 tablets by mouth every 4 (four) hours as needed. 07/18/24  Yes Myriam Carrier C, PA  levocetirizine (XYZAL ) 5 MG tablet TAKE 1 TABLET BY MOUTH EVERY DAY IN THE EVENING FOR ALLERGIES 05/12/24  Yes Clark, Katherine K, NP  losartan  (COZAAR ) 50 MG tablet TAKE 1 TABLET (50 MG TOTAL) BY MOUTH DAILY FOR BLOOD PRESSURE. 05/14/24  Yes Clark, Katherine K, NP  minocycline  (MINOCIN ) 100 MG capsule Take 1 capsule (100 mg total) by mouth 2 (two) times  daily. 03/13/24  Yes Melvenia Corean SAILOR, NP  omeprazole  (PRILOSEC) 40 MG capsule Take 1 capsule (40 mg total) by mouth daily. for heartburn. 04/04/24  Yes Clark, Katherine K, NP  ondansetron  (ZOFRAN -ODT) 4 MG disintegrating tablet Take 1 tablet (4 mg total) by mouth every 8 (eight) hours as needed for nausea or vomiting. 06/28/24  Yes Clark, Katherine K, NP  rosuvastatin  (CRESTOR ) 10 MG tablet TAKE 1 TABLET BY MOUTH EVERY DAY FOR CHOLESTEROL 03/21/24  Yes Clark, Katherine K, NP  saxagliptin  HCl (ONGLYZA) 2.5 MG TABS tablet TAKE 1 TABLET (2.5 MG TOTAL) BY MOUTH DAILY. FOR DIABETES. 07/20/24  Yes Clark, Katherine K,  NP  SYNTHROID  150 MCG tablet TAKE 1 TABLET BY MOUTH DAILY BEFORE BREAKFAST. 03/12/24  Yes Trixie File, MD  traMADol  (ULTRAM ) 50 MG tablet Take 1 tablet (50 mg total) by mouth every 12 (twelve) hours as needed. Patient taking differently: Take 50 mg by mouth every 12 (twelve) hours as needed for moderate pain (pain score 4-6). 03/20/24  Yes Yolande Lamar BROCKS, MD  Blood Glucose Monitoring Suppl (FREESTYLE LITE) w/Device KIT Inject 1 each into the skin daily. 11/10/20   Raford Riggs, MD  cyclobenzaprine  (FLEXERIL ) 10 MG tablet Take 1 tablet (10 mg total) by mouth 3 (three) times daily as needed for muscle spasms. Patient not taking: Reported on 06/27/2024 03/21/24   Clark, Katherine K, NP  dolutegravir  ([name] ) 50 MG tablet Take 1 tablet (50 mg total) by mouth daily. Patient not taking: Reported on 06/27/2024 05/13/24   Melvenia Corean SAILOR, NP  Lancets [facility] ULTRASOFT) lancets Use as instructed to test blood sugar daily 02/15/24   Clark, Katherine K, NP  lidocaine  (LIDODERM ) 5 % PLACE 1 PATCH ONTO THE SKIN DAILY. REMOVE & DISCARD PATCH WITHIN 12 HOURS OR AS DIRECTED BY MD 04/21/24   Gretta Comer POUR, NP  predniSONE  (STERAPRED UNI-PAK 21 TAB) 10 MG (21) TBPK tablet Take by mouth daily. Take 6 tabs by mouth daily  for 2 days, then 5 tabs for 2 days, then 4 tabs for 2 days, then 3 tabs for 2 days, 2 tabs for 2 days, then 1 tab by mouth daily for 2 days Patient not taking: Reported on 07/30/2024 07/18/24   Myriam Dorn BROCKS, PA    Allergies: Genvoya [elviteg-cobic-emtricit-tenofaf], Lisinopril, Aldactone  [spironolactone ], and Tegaderm ag mesh [silver]    Review of Systems  Updated Vital Signs BP 134/77   Pulse 69   Temp 97.9 F (36.6 C)   Resp 18   Ht 5' 5 (1.651 m)   Wt 79 kg   LMP 03/31/2014 (LMP Unknown)   SpO2 98%   BMI 28.98 kg/m   Physical Exam Vitals and nursing note reviewed.  Constitutional:      General: She is not in acute distress.    Appearance: She is well-developed.      Comments: Appears uncomfortable  HENT:     Head: Normocephalic and atraumatic.     Mouth/Throat:     Mouth: Mucous membranes are dry.  Eyes:     Extraocular Movements: Extraocular movements intact.     Pupils: Pupils are equal, round, and reactive to light.     Comments: IOP in the right eye is 10  Cardiovascular:     Rate and Rhythm: Normal rate and regular rhythm.     Pulses: Normal pulses.     Heart sounds: Normal heart sounds. No murmur heard.    No friction rub.  Pulmonary:     Effort: Pulmonary effort  is normal.     Breath sounds: Normal breath sounds. No wheezing or rales.  Abdominal:     General: Bowel sounds are normal. There is no distension.     Palpations: Abdomen is soft.     Tenderness: There is no abdominal tenderness. There is no guarding or rebound.  Musculoskeletal:        General: No tenderness. Normal range of motion.     Cervical back: Normal range of motion and neck supple. No tenderness.     Right lower leg: No edema.     Left lower leg: No edema.     Comments: No edema  Skin:    General: Skin is warm and dry.     Findings: No rash.  Neurological:     Mental Status: She is alert and oriented to person, place, and time.     Cranial Nerves: No cranial nerve deficit, dysarthria or facial asymmetry.     Sensory: Sensation is intact.     Motor: Motor function is intact. No weakness or pronator drift.     Coordination: Coordination is intact. Heel to [facility] Test normal.     Comments: Photophobia in both eyes  Psychiatric:        Behavior: Behavior normal.     (all labs ordered are listed, but only abnormal results are displayed) Labs Reviewed  RESP PANEL BY RT-PCR (RSV, FLU A&B, COVID)  [id number]  CBC WITH DIFFERENTIAL/PLATELET  I-STAT CHEM 8, ED    EKG: None  Radiology: No results found.   Procedures   Medications Ordered in the ED  metoCLOPramide  (REGLAN ) injection 10 mg (has no administration in time range)  HYDROmorphone  (DILAUDID )  injection 0.5 mg (has no administration in time range)  lactated ringers  bolus 500 mL (has no administration in time range)                                    Medical Decision Making Amount and/or Complexity of Data Reviewed Labs: ordered. Radiology: ordered.  Risk Prescription drug management.   Pt with multiple medical problems and comorbidities and presenting today with a complaint that caries a high risk for morbidity and mortality.  Here today with the above complaint with a new onset headache.  Patient does not have a history of headaches.  Concern for possible sinusitis/viral etiology given patient's recent URI symptoms versus intracranial bleed versus migraine versus possible glaucoma as she is complaining of some blurry vision and has significant photophobia.  Will ensure labs show no acute findings.  Patient given pain control.  CTA pending of head.  7:48 PM IOP of the right eye is 10 and pt not having pain in the left eye.  Given normal intraocular pressure low suspicion for glaucoma.  Patient is not complaining of any chest pain, shortness of breath is complaining of stiffness in her legs and back pain but was recently evaluated in the emergency room for back pain and had a CTA of her aorta due to her prior graft and dissection without any acute changes.  11:00 PM I dependently interpreted patient's labs and CBC with a leukocytosis of 16 with a stable hemoglobin.  Patient just finished steroids yesterday after getting placed on them last week because of her ongoing back pain thought to be related to sciatica, Chem-8 is consistent with end-stage renal disease with a creatinine of 13 and a BUN of 78 but normal sodium  and potassium levels, viral swabs are negative.  I have independently visualized and interpreted pt's images today. CTA of the head was negative for evidence of aneurysm or bleed.  Radiology reports negative for acute findings but does show some chronic microvascular  ischemic disease.  On repeat evaluation patient still complaining of severe headache.  Will try a further headache control however patient is limited on the medications she can receive due to her other underlying medical conditions.  She continues to have headache we will consider an MRV to ensure no evidence of venous thrombosis.      Final diagnoses:  None    ED Discharge Orders     None          Doretha Folks, MD 07/30/24 [phone]

## 2024-07-31 ENCOUNTER — Ambulatory Visit: Admitting: Internal Medicine

## 2024-07-31 ENCOUNTER — Observation Stay (HOSPITAL_COMMUNITY)

## 2024-07-31 ENCOUNTER — Encounter (HOSPITAL_COMMUNITY): Payer: Self-pay | Admitting: Family Medicine

## 2024-07-31 ENCOUNTER — Emergency Department (HOSPITAL_COMMUNITY)

## 2024-07-31 DIAGNOSIS — G43809 Other migraine, not intractable, without status migrainosus: Secondary | ICD-10-CM | POA: Diagnosis not present

## 2024-07-31 DIAGNOSIS — R110 Nausea: Secondary | ICD-10-CM | POA: Diagnosis not present

## 2024-07-31 DIAGNOSIS — G43909 Migraine, unspecified, not intractable, without status migrainosus: Principal | ICD-10-CM | POA: Diagnosis present

## 2024-07-31 DIAGNOSIS — N186 End stage renal disease: Secondary | ICD-10-CM | POA: Diagnosis not present

## 2024-07-31 DIAGNOSIS — E0500 Thyrotoxicosis with diffuse goiter without thyrotoxic crisis or storm: Secondary | ICD-10-CM

## 2024-07-31 DIAGNOSIS — L732 Hidradenitis suppurativa: Secondary | ICD-10-CM | POA: Diagnosis not present

## 2024-07-31 DIAGNOSIS — M436 Torticollis: Secondary | ICD-10-CM | POA: Diagnosis not present

## 2024-07-31 DIAGNOSIS — Z8679 Personal history of other diseases of the circulatory system: Secondary | ICD-10-CM

## 2024-07-31 DIAGNOSIS — D869 Sarcoidosis, unspecified: Secondary | ICD-10-CM

## 2024-07-31 DIAGNOSIS — D497 Neoplasm of unspecified behavior of endocrine glands and other parts of nervous system: Secondary | ICD-10-CM | POA: Diagnosis not present

## 2024-07-31 DIAGNOSIS — R519 Headache, unspecified: Secondary | ICD-10-CM | POA: Diagnosis not present

## 2024-07-31 DIAGNOSIS — D72829 Elevated white blood cell count, unspecified: Secondary | ICD-10-CM | POA: Insufficient documentation

## 2024-07-31 DIAGNOSIS — H53149 Visual discomfort, unspecified: Secondary | ICD-10-CM | POA: Diagnosis not present

## 2024-07-31 DIAGNOSIS — R262 Difficulty in walking, not elsewhere classified: Secondary | ICD-10-CM | POA: Diagnosis not present

## 2024-07-31 DIAGNOSIS — Z992 Dependence on renal dialysis: Secondary | ICD-10-CM | POA: Diagnosis not present

## 2024-07-31 LAB — HEMOGLOBIN A1C
Hgb A1c MFr Bld: 6.1 % — ABNORMAL HIGH (ref 4.8–5.6)
Mean Plasma Glucose: 128.37 mg/dL

## 2024-07-31 LAB — CBG MONITORING, ED: Glucose-Capillary: 88 mg/dL (ref 70–99)

## 2024-07-31 LAB — GLUCOSE, CAPILLARY
Glucose-Capillary: 70 mg/dL (ref 70–99)
Glucose-Capillary: 180 mg/dL — ABNORMAL HIGH (ref 70–99)

## 2024-07-31 MED ORDER — LORAZEPAM 0.5 MG PO TABS
0.5000 mg | ORAL_TABLET | ORAL | Status: AC
Start: 2024-07-31 — End: 2024-07-31
  Administered 2024-07-31: 0.5 mg via ORAL
  Filled 2024-07-31: qty 1

## 2024-07-31 MED ORDER — DIAZEPAM 5 MG/ML IJ SOLN
5.0000 mg | Freq: Once | INTRAMUSCULAR | Status: AC
  Administered 2024-07-31: 5 mg via INTRAVENOUS
  Filled 2024-07-31: qty 2

## 2024-07-31 MED ORDER — EMTRICITABINE-TENOFOVIR AF 200-25 MG PO TABS
1.0000 | ORAL_TABLET | Freq: Every day | ORAL | Status: DC
  Administered 2024-07-31 – 2024-08-04 (×5): 1 via ORAL
  Filled 2024-07-31 (×6): qty 1

## 2024-07-31 MED ORDER — HEPARIN SODIUM (PORCINE) 5000 UNIT/ML IJ SOLN
5000.0000 [IU] | Freq: Three times a day (TID) | INTRAMUSCULAR | Status: DC
  Administered 2024-07-31 – 2024-08-04 (×11): 5000 [IU] via SUBCUTANEOUS
  Filled 2024-07-31 (×10): qty 1

## 2024-07-31 MED ORDER — DELFLEX-LC/1.5% DEXTROSE 344 MOSM/L IP SOLN: INTRAPERITONEAL | Status: DC

## 2024-07-31 MED ORDER — PROCHLORPERAZINE EDISYLATE 10 MG/2ML IJ SOLN
10.0000 mg | Freq: Once | INTRAMUSCULAR | Status: AC
  Administered 2024-07-31: 10 mg via INTRAVENOUS
  Filled 2024-07-31: qty 2

## 2024-07-31 MED ORDER — ALBUTEROL SULFATE (2.5 MG/3ML) 0.083% IN NEBU
3.0000 mL | INHALATION_SOLUTION | RESPIRATORY_TRACT | Status: DC | PRN
  Administered 2024-08-03: 3 mL via RESPIRATORY_TRACT
  Filled 2024-07-31: qty 3

## 2024-07-31 MED ORDER — BUDESONIDE 0.25 MG/2ML IN SUSP
0.2500 mg | Freq: Two times a day (BID) | RESPIRATORY_TRACT | Status: DC
  Administered 2024-07-31 – 2024-08-04 (×7): 0.25 mg via RESPIRATORY_TRACT
  Filled 2024-07-31 (×8): qty 2

## 2024-07-31 MED ORDER — MINOCYCLINE HCL 50 MG PO CAPS
100.0000 mg | ORAL_CAPSULE | Freq: Two times a day (BID) | ORAL | Status: DC
  Administered 2024-07-31 – 2024-08-04 (×8): 100 mg via ORAL
  Filled 2024-07-31 (×12): qty 2

## 2024-07-31 MED ORDER — ALLOPURINOL 100 MG PO TABS
100.0000 mg | ORAL_TABLET | Freq: Every day | ORAL | Status: DC
  Administered 2024-07-31 – 2024-08-04 (×5): 100 mg via ORAL
  Filled 2024-07-31 (×5): qty 1

## 2024-07-31 MED ORDER — INSULIN ASPART 100 UNIT/ML IJ SOLN
0.0000 [IU] | Freq: Three times a day (TID) | INTRAMUSCULAR | Status: DC
  Administered 2024-08-01 – 2024-08-02 (×3): 1 [IU] via SUBCUTANEOUS
  Administered 2024-08-03: 0 [IU] via SUBCUTANEOUS
  Administered 2024-08-04: 1 [IU] via SUBCUTANEOUS

## 2024-07-31 MED ORDER — DOLUTEGRAVIR SODIUM 50 MG PO TABS: 50.0000 mg | ORAL_TABLET | Freq: Every day | ORAL | Status: DC

## 2024-07-31 MED ORDER — ASPIRIN 81 MG PO TBEC
81.0000 mg | DELAYED_RELEASE_TABLET | Freq: Every day | ORAL | Status: DC
  Administered 2024-07-31: 81 mg via ORAL
  Filled 2024-07-31: qty 1

## 2024-07-31 MED ORDER — LEVOTHYROXINE SODIUM 75 MCG PO TABS
150.0000 ug | ORAL_TABLET | Freq: Every day | ORAL | Status: DC
  Administered 2024-08-01 – 2024-08-04 (×4): 150 ug via ORAL
  Filled 2024-07-31 (×4): qty 2

## 2024-07-31 MED ORDER — METOCLOPRAMIDE HCL 5 MG/ML IJ SOLN: 5.0000 mg | Freq: Two times a day (BID) | INTRAMUSCULAR | Status: DC | PRN

## 2024-07-31 MED ORDER — HEPARIN SODIUM (PORCINE) 1000 UNIT/ML IJ SOLN
INTRAPERITONEAL | Status: DC | PRN
Start: 2024-07-31 — End: 2024-08-04

## 2024-07-31 MED ORDER — PANTOPRAZOLE SODIUM 40 MG PO TBEC
40.0000 mg | DELAYED_RELEASE_TABLET | Freq: Every day | ORAL | Status: DC
  Administered 2024-07-31 – 2024-08-04 (×5): 40 mg via ORAL
  Filled 2024-07-31 (×6): qty 1

## 2024-07-31 MED ORDER — ONDANSETRON HCL 4 MG/2ML IJ SOLN
4.0000 mg | Freq: Once | INTRAMUSCULAR | Status: AC
  Administered 2024-07-31: 4 mg via INTRAVENOUS
  Filled 2024-07-31: qty 2

## 2024-07-31 MED ORDER — ROSUVASTATIN CALCIUM 5 MG PO TABS
10.0000 mg | ORAL_TABLET | Freq: Every day | ORAL | Status: DC
  Administered 2024-07-31 – 2024-08-04 (×5): 10 mg via ORAL
  Filled 2024-07-31 (×5): qty 2

## 2024-07-31 MED ORDER — FERRIC CITRATE 1 GM 210 MG(FE) PO TABS
210.0000 mg | ORAL_TABLET | Freq: Three times a day (TID) | ORAL | Status: DC
  Administered 2024-08-02 – 2024-08-03 (×4): 210 mg via ORAL
  Filled 2024-07-31 (×9): qty 1

## 2024-07-31 MED ORDER — KETOROLAC TROMETHAMINE 15 MG/ML IJ SOLN
15.0000 mg | INTRAMUSCULAR | Status: AC
  Administered 2024-07-31: 15 mg via INTRAVENOUS
  Filled 2024-07-31: qty 1

## 2024-07-31 MED ORDER — PROCHLORPERAZINE EDISYLATE 10 MG/2ML IJ SOLN
10.0000 mg | Freq: Four times a day (QID) | INTRAMUSCULAR | Status: DC | PRN
  Administered 2024-07-31: 10 mg via INTRAVENOUS
  Filled 2024-07-31: qty 2

## 2024-07-31 MED ORDER — GENTAMICIN SULFATE 0.1 % EX CREA
1.0000 | TOPICAL_CREAM | Freq: Every day | CUTANEOUS | Status: DC
  Administered 2024-07-31: 1 via TOPICAL
  Filled 2024-07-31: qty 15

## 2024-07-31 MED ORDER — FLUTICASONE PROPIONATE HFA 110 MCG/ACT IN AERO: 1.0000 | INHALATION_SPRAY | Freq: Two times a day (BID) | RESPIRATORY_TRACT | Status: DC

## 2024-07-31 NOTE — ED Notes (Signed)
Pt went to MRI.

## 2024-07-31 NOTE — Assessment & Plan Note (Signed)
 Appears stable from a respiratory standpoint at present Continue home regimen including albuterol 

## 2024-07-31 NOTE — Progress Notes (Signed)
 Received in bed from ED.  Oriented to room.

## 2024-07-31 NOTE — Assessment & Plan Note (Addendum)
 Persistent severe headache times roughly 1 week  New onset Distribution frontal and occipital with ddx including sinus vs. Tensions type  Noted photophobia, nausea and generalized severe headache No focal signs or indication of meningitic symptoms at present Status post CTA of the neck and head as well as MRV that was grossly stable Some symptomatic improvement with Reglan  and Compazine  Given persistence of headache, will also reach out to neurology for any further recommendations Will otherwise continue to follow closely Follow-up neurology recommendations

## 2024-07-31 NOTE — Assessment & Plan Note (Signed)
 Mood appears stable  No reported active HI/SI  Cont home buspar  Monitor

## 2024-07-31 NOTE — ED Notes (Signed)
 No IV access at this time unable to give nausea med ordered pt aware of delay

## 2024-07-31 NOTE — Assessment & Plan Note (Signed)
 On doxycycline  chronically

## 2024-07-31 NOTE — Telephone Encounter (Signed)
Noted, patient currently in the ED.

## 2024-07-31 NOTE — Assessment & Plan Note (Signed)
 Patient reports compliance with HAART regimen Continue home Descovy  Check CD4 count x 1

## 2024-07-31 NOTE — Consult Note (Signed)
 [facility] Renal Consultation Note    Indication for Consultation:  Management of ESRD/hemodialysis; anemia, hypertension/volume and secondary hyperparathyroidism  ERE:Rojmx, Comer POUR, NP  HPI: [name] is a 58 y.o. female with on PD  at [facility]. She has a past medical history significant for  sarcoidosis, hidradenitis, HLD, Graves' disease (s/p thyroidectomy-on Synthroid ), HTN, history of aortic dissection (s/p stent placement) presenting with ongoing headache.  Seen and examined patient at bedside in the ED.  Patient's mother also at bedside.  She reports the headache started on Monday which has worsened since then. She describes the headache as a throbbing sensation. She endorses blurred vision with nausea. She remains on RA and current renal labs are stable. Head CT and MRV are also negative. He reports compliance with her peritoneal dialysis and denies any acute issues with her treatments. Currently, she denies SOB, CP, vomiting, or ABD pain. Plan to resume her PD while she's here. We cannot start her PD until she is assigned a bed to a medical floor. I explained this to both the patient and mother today.  Past Medical History:  Diagnosis Date   Acute pain of right shoulder due to trauma 06/08/2023   Acute pancreatitis 10/23/2021   Acute renal failure superimposed on stage 4 chronic kidney disease (HCC) 10/24/2021   Allergy    Anemia    Normocytic   Antibiotic-induced yeast infection 09/28/2020   Anxiety    Asthma    Bronchitis 2005   Bursitis of left shoulder 09/06/2019   CKD (chronic kidney disease)    Dialysis on Mon-Wed- Fri   CLASS 1-EXOPHTHALMOS-THYROTOXIC 02/08/2007   Cognitive dysfunction 02/21/2020   COVID-19 long hauler 05/11/2021   COVID-19 virus infection 04/28/2022   Diabetes mellitus without complication (HCC)    type 2   Dissecting abdominal aortic aneurysm (HCC) 02/05/2023   DVT (deep venous thrombosis) (HCC)  08/29/2023   post-op DVT left IJ, brachial, axillary, subclavian veins 08/29/23   DVT of upper extremity (deep vein thrombosis) (HCC) 09/03/2023   Dysphagia 07/23/2019   Encephalopathy acute 02/02/2023   Family history of breast cancer    Family history of lung cancer    Family history of prostate cancer    Gastroenteritis 07/10/2007   Genetic testing 07/25/2018   [employer] + RNA Insight was ordered.  Genes Analyzed (43 total): APC*, ATM*, AXIN2, BARD1, BMPR1A, BRCA1*, BRCA2*, BRIP1*, CDH1*, CDK4, CDKN2A, CHEK2*, DICER1, GALNT12, HOXB13, MEN1, MLH1*, MRE11A, MSH2*, MSH3, MSH6*, MUTYH*, NBN, NF1*, NTHL1, PALB2*, PMS2*, POLD1, POLE, PTEN*, RAD50, RAD51C*, RAD51D*, RET, SDHB, SDHD, SMAD4, SMARCA4, STK11 and TP53* (sequencing and deletion/duplication); EGFR (s   GERD 07/24/2006   GRAVE'S DISEASE 01/01/2008   History of hidradenitis suppurativa    History of kidney stones    History of thrush    HIV DISEASE 07/24/2006   dx March 05   Hyperlipidemia    HYPERTENSION 07/24/2006   Hyperthyroidism 08/2006   Grave's Disease -diffuse radiotracer uptake 08/25/06 Thyroid  scan-Cold nodule to R lower lobe of thyrorid   Ileus (HCC) 02/14/2023   Left bundle branch block (LBBB)    Malnutrition of moderate degree 02/08/2023   Menometrorrhagia    hx of   Nephrolithiasis    Panniculitis 05/12/2020   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 Completed 2 yrs Prednisone  after lung bx confirmation. No symptoms since then.   Sebaceous cyst 04/21/2020   Suppurative hidradenitis    Thyroid  cancer (HCC)    THYROID  NODULE, RIGHT 02/08/2007   Past Surgical History:  Procedure Laterality Date   AORTIC ARCH DEBRANCHING N/A 08/25/2023   Procedure: AORTIC ARCH DEBRANCHING USING 12X8X8MM HEMASHIELD GRAFT;  Surgeon: Lucas Dorise POUR, MD;  Location: [facility];  Service: Open Heart Surgery;  Laterality: N/A;  with bypasses to the innominate and left common carotid arteries   APPLICATION OF WOUND VAC N/A 01/20/2021   Procedure: APPLICATION OF WOUND VAC;  Surgeon: Lowery Estefana RAMAN, DO;  Location: [facility];  Service: Plastics;  Laterality: N/A;   BREAST EXCISIONAL BIOPSY Right 04/26/2018   right axilla negative   BREAST EXCISIONAL BIOPSY Left 04/26/2018   left axilla negative   BREAST LUMPECTOMY Right 10/03/2018   malignant   BREAST LUMPECTOMY WITH RADIOACTIVE SEED AND SENTINEL LYMPH NODE BIOPSY Right 10/03/2018   Procedure: RIGHT BREAST LUMPECTOMY WITH RADIOACTIVE SEED AND SENTINEL LYMPH NODE MAPPING;  Surgeon: Vanderbilt Ned, MD;  Location: [facility] OR;  Service: General;  Laterality: Right;   BREAST SURGERY  1997   Breast Reduction    CAPD INSERTION N/A 12/21/2023   Procedure: LAPAROSCOPIC INSERTION CONTINUOUS AMBULATORY PERITONEAL DIALYSIS  (CAPD) CATHETER; LAPAROSCOPIC OMENTUMPEXY;  Surgeon: Magda Ned SAILOR, MD;  Location: [facility];  Service: Vascular;  Laterality: N/A;   CYSTOSCOPY W/ URETERAL STENT REMOVAL  11/09/2012   Procedure: CYSTOSCOPY WITH STENT REMOVAL;  Surgeon: Ricardo Likens, MD;  Location: [facility];  Service: Urology;  Laterality: Right;   CYSTOSCOPY WITH RETROGRADE PYELOGRAM, URETEROSCOPY AND STENT PLACEMENT  11/09/2012   Procedure: CYSTOSCOPY WITH RETROGRADE PYELOGRAM, URETEROSCOPY AND STENT PLACEMENT;  Surgeon: Ricardo Likens, MD;  Location: [facility];  Service: Urology;  Laterality: Left;  LEFT URETEROSCOPY,  STONE MANIPULATION, left STENT exchange    CYSTOSCOPY WITH STENT PLACEMENT  10/02/2012   Procedure: CYSTOSCOPY WITH STENT PLACEMENT;  Surgeon: Ricardo Likens, MD;  Location: [facility];  Service: Urology;  Laterality: Left;   DEBRIDEMENT AND CLOSURE WOUND N/A 01/20/2021   Procedure: Excision of abdominal wound with closure;  Surgeon: Lowery Estefana RAMAN, DO;  Location:  SURGERY CENTER;  Service: Plastics;  Laterality: N/A;   DILATION AND CURETTAGE OF UTERUS  11/2002   s/p for 1st trimester nonviable pregnancy   EYE SURGERY     sty under eyelid   INCISE AND DRAIN ABCESS  08/2002   s/p I &D for righ inframmary fold hidradenitis   INCISION AND DRAINAGE PERITONSILLAR ABSCESS  12/2001   IR CV LINE INJECTION  06/07/2018   IR FLUORO GUIDE CV LINE RIGHT  01/30/2023   IR IMAGING GUIDED PORT INSERTION  06/20/2018   IR REMOVAL TUN ACCESS W/ PORT W/O FL MOD SED  06/20/2018   IR US  GUIDE VASC ACCESS RIGHT  01/30/2023   IRRIGATION AND DEBRIDEMENT ABSCESS  01/31/2012   Procedure: IRRIGATION AND DEBRIDEMENT ABSCESS;  Surgeon: Alm VEAR Angle, MD;  [facility]: [facility];  Service: General;  Laterality: Right;  right breast and axilla    LAPAROSCOPIC REPOSITIONING CAPD CATHETER  Left 01/10/2024   Procedure: Removal of peritoneal dialysis Catheter.;  Surgeon: Magda Debby SAILOR, MD;  Location: [facility] OR;  Service: Vascular;  Laterality: Left;   LAPAROSCOPY N/A 01/10/2024   Procedure: LAPAROSCOPY, DIAGNOSTIC;  Surgeon: Magda Debby SAILOR, MD;  Location: [facility] OR;  Service: Vascular;  Laterality: N/A;   NEPHROLITHOTOMY  10/02/2012   Procedure: NEPHROLITHOTOMY PERCUTANEOUS;  Surgeon: Ricardo Likens, MD;  [facility]: [facility];  Service: Urology;  Laterality: Right;  First Stage Percutaneous Nephrolithotomy with Surgeon Access, Left Ureteral Stent     NEPHROLITHOTOMY  10/04/2012   Procedure: NEPHROLITHOTOMY PERCUTANEOUS SECOND LOOK;  Surgeon: Ricardo Likens, MD;  [facility];  Service: Urology;  Laterality: Right;       NEPHROLITHOTOMY  10/08/2012   Procedure: NEPHROLITHOTOMY PERCUTANEOUS;  Surgeon: Ricardo Likens, MD;  [facility];  Service: Urology;  Laterality: Right;  THIRD STAGE, nephrostomy tube exchange x 2   NEPHROLITHOTOMY  10/11/2012   Procedure: NEPHROLITHOTOMY PERCUTANEOUS SECOND LOOK;  Surgeon: Ricardo Likens, MD;  [facility];  Service: Urology;  Laterality: Right;  RIGHT 4 STAGE PERCUTANOUS NEPHROLITHOTOMY, right URETEROSCOPY WITH HOLMIUM LASER    PANNICULECTOMY N/A 12/21/2020   Procedure: PANNICULECTOMY;  Surgeon: Lowery Estefana RAMAN, DO;  [facility];  Service: Plastics;  Laterality: N/A;   PERCUTANEOUS NEPHROSTOLITHOTOMY  04/2022   PORT-A-CATH REMOVAL N/A 07/16/2020   Procedure: REMOVAL PORT-A-CATH;  Surgeon: Vanderbilt Debby, MD;  Location: [facility];  Service: General;  Laterality: N/A;   PORTACATH PLACEMENT Left 05/17/2018   Procedure: INSERTION PORT-A-CATH;  Surgeon: Vernetta Berg, MD;  Location: [facility];  Service: General;  Laterality: Left;   RADICAL NECK DISSECTION  04/12/2017   limited/notes 04/12/2017   RADICAL NECK DISSECTION N/A 04/12/2017   Procedure: RADICAL NECK DISSECTION;  Surgeon: Carlie Clark, MD;  Location: [facility] OR;  Service: ENT;  Laterality: N/A;  limited neck dissection 2 hours total   REDUCTION MAMMAPLASTY Bilateral 1998   RIGHT/LEFT HEART CATH AND CORONARY ANGIOGRAPHY N/A 03/12/2020   Procedure: RIGHT/LEFT HEART CATH AND CORONARY ANGIOGRAPHY;  Surgeon: Cherrie Toribio SAUNDERS, MD;  [facility];  Service: Cardiovascular;  Laterality: N/A;   [place]  1994   TEE WITHOUT CARDIOVERSION N/A 08/25/2023   Procedure: TRANSESOPHAGEAL ECHOCARDIOGRAM;  Surgeon: Lucas Dorise POUR, MD;  [facility];  Service: Open Heart Surgery;  Laterality: N/A;   TENCKHOFF CATHETER INSERTION Left 01/10/2024   Procedure: INSERTION, CATHETER, DIALYSIS, PERITONEAL;  Surgeon: Magda Debby SAILOR, MD;  Location: [facility];  Service: Vascular;   Laterality: Left;   THORACIC AORTIC ENDOVASCULAR STENT GRAFT N/A 02/02/2023   Procedure: THORACIC AORTIC ENDOVASCULAR STENT GRAFT;  Surgeon: Lanis Fonda BRAVO, MD;  Location: [facility] OR;  Service: Vascular;  Laterality: N/A;   THORACIC AORTIC ENDOVASCULAR STENT GRAFT N/A 08/25/2023   Procedure: THORACIC AORTIC ENDOVASCULAR STENT GRAFT;  Surgeon: Lanis Fonda BRAVO, MD;  Location: [name] OR;  Service: Vascular;  Laterality: N/A;   THYROIDECTOMY  04/12/2017   completion/notes 04/12/2017   THYROIDECTOMY N/A 04/12/2017   Procedure: THYROIDECTOMY;  Surgeon: Carlie Clark, MD;  Location: [facility] OR;  Service: ENT;  Laterality: N/A;  Completion Thyroidectomy   TOTAL THYROIDECTOMY  2010   ULTRASOUND GUIDANCE FOR VASCULAR ACCESS Right 08/25/2023   Procedure: ULTRASOUND GUIDANCE FOR VASCULAR ACCESS, RIGHT FEMORAL ARTERY;  Surgeon: Lanis Fonda BRAVO, MD;  Location: [facility] OR;  Service: Vascular;  Laterality: Right;   Family History  Problem Relation Age of Onset   Hypertension Mother    Cancer Mother  laryngeal   Heart disease Mother        stent   Pancreatic cancer Father    Hypertension Father    Lung cancer Father 30       hx smoking   Hypertension Sister    Hypertension Sister    Hypertension Brother    Breast cancer Maternal Aunt 66   Breast cancer Maternal Aunt        dx 60+   Cancer Maternal Uncle        Lung CA   Breast cancer Paternal Aunt 60   Breast cancer Paternal Aunt        dx 70's   Breast cancer Paternal Aunt        dx 50's   Prostate cancer Paternal Uncle    Prostate cancer Paternal Uncle    Lung cancer Paternal Uncle    Breast cancer Cousin 78   Breast cancer Cousin        dx <50   Breast cancer Cousin        dx <50   Breast cancer Cousin        dx <50   Heart disease Other    Hypertension Other    Stroke Other        Grandparent   Kidney disease Other        Grandparent   Diabetes Other        FH of Diabetes   Colon cancer Neg Hx    Esophageal cancer Neg Hx    Rectal  cancer Neg Hx    Stomach cancer Neg Hx    Social History:  reports that she quit smoking about 6 years ago. Her smoking use included cigarettes. She started smoking about 7 years ago. She has a 7.5 pack-year smoking history. She has never used smokeless tobacco. She reports that she does not currently use alcohol . She reports that she does not use drugs. Allergies  Allergen Reactions   Genvoya [Elviteg-Cobic-Emtricit-Tenofaf] Hives   Lisinopril Cough   Aldactone  [Spironolactone ] Hives   Tegaderm Ag Mesh [Silver] Itching   Prior to Admission medications   Medication Sig Start Date End Date Taking? Authorizing Provider  acetaminophen  (TYLENOL ) 500 MG tablet Take 1,000 mg by mouth every 6 (six) hours as needed for mild pain.   Yes [provider]  albuterol  (VENTOLIN  HFA) 108 (90 Base) MCG/ACT inhaler INHALE 2 PUFFS INTO THE LUNGS UP TO EVERY 6HRS AS NEEDED FOR WHEEZING/SHORTNESS OF BREATH 09/06/23  Yes Gretta Comer POUR, NP  allopurinol  (ZYLOPRIM ) 100 MG tablet TAKE 1 TABLET (100 MG TOTAL) BY MOUTH DAILY. FOR GOUT PREVENTION 04/11/24  Yes Clark, Katherine K, NP  amLODipine  (NORVASC ) 10 MG tablet Take 10 mg by mouth daily. 05/29/23  Yes [provider]  aspirin  EC 81 MG tablet Take 81 mg by mouth daily.   Yes [provider]  AURYXIA  1 GM 210 MG(Fe) tablet Take 210 mg by mouth 3 (three) times daily with meals. 04/05/23  Yes [provider]  busPIRone  (BUSPAR ) 5 MG tablet Take 1 tablet (5 mg total) by mouth 2 (two) times daily. For anxiety 06/05/24  Yes Gretta Comer POUR, NP  carvedilol  (COREG ) 12.5 MG tablet Take 12.5 mg by mouth 2 (two) times daily. 10/20/23  Yes [provider]  emtricitabine -tenofovir  AF (DESCOVY ) 200-25 MG tablet Take 1 tablet by mouth daily. 05/13/24  Yes Melvenia Corean SAILOR, NP  fluticasone  (FLOVENT  HFA) 110 MCG/ACT inhaler Inhale 1 puff into the lungs 2 (two) times  daily.   Yes [provider]  HYDROcodone -acetaminophen   (NORCO/VICODIN) 5-325 MG tablet Take 2 tablets by mouth every 4 (four) hours as needed. 07/18/24  Yes Myriam Carrier C, PA  levocetirizine (XYZAL ) 5 MG tablet TAKE 1 TABLET BY MOUTH EVERY DAY IN THE EVENING FOR ALLERGIES 05/12/24  Yes Clark, Katherine K, NP  lidocaine  (LIDODERM ) 5 % PLACE 1 PATCH ONTO THE SKIN DAILY. REMOVE & DISCARD PATCH WITHIN 12 HOURS OR AS DIRECTED BY MD Patient taking differently: Place 1 patch onto the skin daily as needed (for pain). 04/21/24  Yes Clark, Katherine K, NP  losartan  (COZAAR ) 50 MG tablet TAKE 1 TABLET (50 MG TOTAL) BY MOUTH DAILY FOR BLOOD PRESSURE. 05/14/24  Yes Gretta Comer POUR, NP  minocycline  (MINOCIN ) 100 MG capsule Take 1 capsule (100 mg total) by mouth 2 (two) times daily. 03/13/24  Yes Melvenia Corean SAILOR, NP  omeprazole  (PRILOSEC) 40 MG capsule Take 1 capsule (40 mg total) by mouth daily. for heartburn. 04/04/24  Yes Clark, Katherine K, NP  ondansetron  (ZOFRAN -ODT) 4 MG disintegrating tablet Take 1 tablet (4 mg total) by mouth every 8 (eight) hours as needed for nausea or vomiting. 06/28/24  Yes Clark, Katherine K, NP  rosuvastatin  (CRESTOR ) 10 MG tablet TAKE 1 TABLET BY MOUTH EVERY DAY FOR CHOLESTEROL 03/21/24  Yes Clark, Katherine K, NP  saxagliptin  HCl (ONGLYZA) 2.5 MG TABS tablet TAKE 1 TABLET (2.5 MG TOTAL) BY MOUTH DAILY. FOR DIABETES. 07/20/24  Yes Clark, Katherine K, NP  SYNTHROID  150 MCG tablet TAKE 1 TABLET BY MOUTH DAILY BEFORE BREAKFAST. 03/12/24  Yes Trixie File, MD  traMADol  (ULTRAM ) 50 MG tablet Take 1 tablet (50 mg total) by mouth every 12 (twelve) hours as needed. Patient taking differently: Take 50 mg by mouth every 12 (twelve) hours as needed for moderate pain (pain score 4-6). 03/20/24  Yes Yolande Lamar BROCKS, MD  Blood Glucose Monitoring Suppl (FREESTYLE LITE) w/Device KIT Inject 1 each into the skin daily. 11/10/20   Raford Riggs, MD  dolutegravir  ([name] ) 50 MG tablet Take 1 tablet (50 mg total) by mouth daily. Patient not taking:  Reported on 06/27/2024 05/13/24   Melvenia Corean SAILOR, NP  Lancets [facility] ULTRASOFT) lancets Use as instructed to test blood sugar daily 02/15/24   Gretta Comer POUR, NP  predniSONE  (STERAPRED UNI-PAK 21 TAB) 10 MG (21) TBPK tablet Take by mouth daily. Take 6 tabs by mouth daily  for 2 days, then 5 tabs for 2 days, then 4 tabs for 2 days, then 3 tabs for 2 days, 2 tabs for 2 days, then 1 tab by mouth daily for 2 days Patient not taking: Reported on 07/30/2024 07/18/24   Myriam Carrier BROCKS, PA   Current Facility-Administered Medications  Medication Dose Route Frequency Provider Last Rate Last Admin   albuterol  (PROVENTIL ) (2.5 MG/3ML) 0.083% nebulizer solution 3 mL  3 mL Inhalation Q4H PRN Eldonna Elspeth PARAS, MD       allopurinol  (ZYLOPRIM ) tablet 100 mg  100 mg Oral Daily Eldonna Elspeth PARAS, MD   100 mg at 07/31/24 1008   aspirin  EC tablet 81 mg  81 mg Oral Daily Eldonna Elspeth PARAS, MD   81 mg at 07/31/24 1007   dialysis solution 1.5% low-MG/low-CA dianeal solution   Intraperitoneal Q24H Eldonna Elspeth PARAS, MD       emtricitabine -tenofovir  AF (DESCOVY ) 200-25 MG per tablet 1 tablet  1 tablet Oral Daily Eldonna Elspeth PARAS, MD   1 tablet at 07/31/24 1006   ferric citrate  (  AURYXIA ) tablet 210 mg  210 mg Oral TID WC Eldonna Elspeth PARAS, MD       fluticasone  (FLOVENT  HFA) 110 MCG/ACT inhaler 1 puff  1 puff Inhalation BID Eldonna Elspeth PARAS, MD       gentamicin  cream (GARAMYCIN ) 0.1 % 1 Application  1 Application Topical Daily Ena Demary E, NP       heparin  sodium (porcine) 3,000 Units in dialysis solution 1.5% low-MG/low-CA 6,000 mL dialysis solution   Peritoneal Dialysis PRN Eldonna Elspeth PARAS, MD       insulin  aspart (novoLOG ) injection 0-6 Units  0-6 Units Subcutaneous TID WC Eldonna Elspeth PARAS, MD       [START ON 08/01/2024] levothyroxine  (SYNTHROID ) tablet 150 mcg  150 mcg Oral QAC breakfast Eldonna Elspeth PARAS, MD       metoCLOPramide  (REGLAN ) injection 5 mg  5 mg Intravenous Q12H PRN Newton, Steven J, MD        minocycline  (MINOCIN ) capsule 100 mg  100 mg Oral BID Eldonna Elspeth PARAS, MD   100 mg at 07/31/24 1007   pantoprazole  (PROTONIX ) EC tablet 40 mg  40 mg Oral Daily Eldonna Elspeth PARAS, MD   40 mg at 07/31/24 1008   prochlorperazine  (COMPAZINE ) injection 10 mg  10 mg Intravenous Q6H PRN Newton, Steven J, MD       rosuvastatin  (CRESTOR ) tablet 10 mg  10 mg Oral Daily Eldonna Elspeth PARAS, MD   10 mg at 07/31/24 1006   Current Outpatient Medications  Medication Sig Dispense Refill   acetaminophen  (TYLENOL ) 500 MG tablet Take 1,000 mg by mouth every 6 (six) hours as needed for mild pain.     albuterol  (VENTOLIN  HFA) 108 (90 Base) MCG/ACT inhaler INHALE 2 PUFFS INTO THE LUNGS UP TO EVERY 6HRS AS NEEDED FOR WHEEZING/SHORTNESS OF BREATH 8 each 0   allopurinol  (ZYLOPRIM ) 100 MG tablet TAKE 1 TABLET (100 MG TOTAL) BY MOUTH DAILY. FOR GOUT PREVENTION 90 tablet 2   amLODipine  (NORVASC ) 10 MG tablet Take 10 mg by mouth daily.     aspirin  EC 81 MG tablet Take 81 mg by mouth daily.     AURYXIA  1 GM 210 MG(Fe) tablet Take 210 mg by mouth 3 (three) times daily with meals.     busPIRone  (BUSPAR ) 5 MG tablet Take 1 tablet (5 mg total) by mouth 2 (two) times daily. For anxiety 180 tablet 0   carvedilol  (COREG ) 12.5 MG tablet Take 12.5 mg by mouth 2 (two) times daily.     emtricitabine -tenofovir  AF (DESCOVY ) 200-25 MG tablet Take 1 tablet by mouth daily. 30 tablet 11   fluticasone  (FLOVENT  HFA) 110 MCG/ACT inhaler Inhale 1 puff into the lungs 2 (two) times daily.     HYDROcodone -acetaminophen  (NORCO/VICODIN) 5-325 MG tablet Take 2 tablets by mouth every 4 (four) hours as needed. 10 tablet 0   levocetirizine (XYZAL ) 5 MG tablet TAKE 1 TABLET BY MOUTH EVERY DAY IN THE EVENING FOR ALLERGIES 90 tablet 1   lidocaine  (LIDODERM ) 5 % PLACE 1 PATCH ONTO THE SKIN DAILY. REMOVE & DISCARD PATCH WITHIN 12 HOURS OR AS DIRECTED BY MD (Patient taking differently: Place 1 patch onto the skin daily as needed (for pain).) 30 patch 0   losartan   (COZAAR ) 50 MG tablet TAKE 1 TABLET (50 MG TOTAL) BY MOUTH DAILY FOR BLOOD PRESSURE. 90 tablet 1   minocycline  (MINOCIN ) 100 MG capsule Take 1 capsule (100 mg total) by mouth 2 (two) times daily. 60 capsule 5   omeprazole  (PRILOSEC) 40  MG capsule Take 1 capsule (40 mg total) by mouth daily. for heartburn. 90 capsule 2   ondansetron  (ZOFRAN -ODT) 4 MG disintegrating tablet Take 1 tablet (4 mg total) by mouth every 8 (eight) hours as needed for nausea or vomiting. 20 tablet 0   rosuvastatin  (CRESTOR ) 10 MG tablet TAKE 1 TABLET BY MOUTH EVERY DAY FOR CHOLESTEROL 90 tablet 2   saxagliptin  HCl (ONGLYZA) 2.5 MG TABS tablet TAKE 1 TABLET (2.5 MG TOTAL) BY MOUTH DAILY. FOR DIABETES. 90 tablet 0   SYNTHROID  150 MCG tablet TAKE 1 TABLET BY MOUTH DAILY BEFORE BREAKFAST. 90 tablet 1   traMADol  (ULTRAM ) 50 MG tablet Take 1 tablet (50 mg total) by mouth every 12 (twelve) hours as needed. (Patient taking differently: Take 50 mg by mouth every 12 (twelve) hours as needed for moderate pain (pain score 4-6).) 15 tablet 0   Blood Glucose Monitoring Suppl (FREESTYLE LITE) w/Device KIT Inject 1 each into the skin daily. 1 kit 0   dolutegravir  ([name] ) 50 MG tablet Take 1 tablet (50 mg total) by mouth daily. (Patient not taking: Reported on 06/27/2024) 30 tablet 11   Lancets (ONETOUCH ULTRASOFT) lancets Use as instructed to test blood sugar daily 100 each 5   predniSONE  (STERAPRED UNI-PAK 21 TAB) 10 MG (21) TBPK tablet Take by mouth daily. Take 6 tabs by mouth daily  for 2 days, then 5 tabs for 2 days, then 4 tabs for 2 days, then 3 tabs for 2 days, 2 tabs for 2 days, then 1 tab by mouth daily for 2 days (Patient not taking: Reported on 07/30/2024) 42 tablet 0   Labs: Basic Metabolic Panel: Recent Labs  Lab 07/30/24 2011 07/30/24 2019  NA 136 136  K 4.8 4.5  CL 93* 97*  CO2 23  --   GLUCOSE 73 72  BUN 79* 78*  CREATININE 13.88* 13.60*  CALCIUM  8.3*  --    Liver Function Tests: Recent Labs  Lab 07/30/24 2011   AST 62*  ALT 90*  ALKPHOS 157*  BILITOT 0.6  PROT 6.4*  ALBUMIN  2.6*   No results for input(s): LIPASE, AMYLASE in the last 168 hours. No results for input(s): AMMONIA in the last 168 hours. CBC: Recent Labs  Lab 07/30/24 2001 07/30/24 2019  WBC 16.1*  --   NEUTROABS 13.0*  --   HGB 10.8* 11.9*  HCT 31.9* 35.0*  MCV 110.0*  --   PLT 200  --    Cardiac Enzymes: No results for input(s): CKTOTAL, CKMB, CKMBINDEX, TROPONINI in the last 168 hours. CBG: Recent Labs  Lab 07/31/24 1246  GLUCAP 88   Iron  Studies: No results for input(s): IRON , TIBC, TRANSFERRIN, FERRITIN in the last 72 hours. Studies/Results: MR BRAIN WO CONTRAST Result Date: 07/31/2024 EXAM: MRI BRAIN WITHOUT CONTRAST 07/31/2024 03:13:51 PM TECHNIQUE: Multiplanar multisequence MRI of the head/brain was performed without the administration of intravenous contrast. COMPARISON: Same day MRV head and CTA head 07/30/2024. CLINICAL HISTORY: Headache, new onset (Age >= 51y). Headache x5 days with neck stiffness. FINDINGS: BRAIN AND VENTRICLES: No acute infarct. No acute intracranial hemorrhage. Chronic microhemorrhages are present in the left frontal lobe and right parietal lobe. Scattered T2 FLAIR hyperintensity in the periventricular and subcortical white matter suggestive of mild chronic microvascular ischemic changes. Additional subtle subglamorality in the pons likely related to the same. 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 Recommend correlation with dedicated MRI of the pituitary/sella  with and without contrast for further evaluation. No midline shift. No hydrocephalus. Normal flow voids. ORBITS: No acute abnormality. SINUSES AND MASTOIDS: No acute abnormality. BONES AND SOFT TISSUES: Normal marrow signal. No acute soft tissue abnormality. IMPRESSION: 1. 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 Recommend dedicated MRI of the pituitary/sella with and without contrast for further evaluation. 2. Mild chronic microvascular ischemic changes. 3. No acute findings. Electronically signed by: Donnice Mania MD 07/31/2024 03:24 PM EDT RP Workstation: [id number]   MR MRV HEAD WO CM Result Date: 07/31/2024 EXAM: MRV BRAIN 07/31/2024 06:32:15 AM TECHNIQUE: Multiplanar multisequence MRV of the head was performed without the administration of intravenous contrast. Multiplanar 2D and 3D reformatted images are provided for review. COMPARISON: CT angiogram of the head dated 07/30/2024. CLINICAL HISTORY: 58 year old female with new onset headache, nausea, blurry vision, photophobia, and difficulty walking due to pain. History includes aortic dissection, HIV, HTN, DM, prior DVT, kidney stones, and ESRD on peritoneal dialysis. FINDINGS: No focal stenosis or thrombus is seen of the major dural venous sinuses. IMPRESSION: 1. Unremarkable MRV of the head. Electronically signed by: Evalene Coho MD 07/31/2024 06:51 AM EDT RP Workstation: [id number]   CT Angio Head W or Wo Contrast Result Date: 07/30/2024 EXAM: CTA Head Without and with Intravenous Contrast CLINICAL HISTORY: Headache, sudden, severe. Migraine; CT Angio Head W or Wo Contrast; Headache, sudden, severe. Omni 350 75mL; See ED Notes:; Pt BIB [facility] from home. Pt began having headache, light sensitivity, nausea, and vomiting beginning at 2am last night. Pt states it has continued throughout the day. Pt says she does not have hx of migraines. TECHNIQUE: Axial CTA images of the head without and with intravenous contrast. MIP reconstructed images were created and reviewed. Dose reduction technique was used including one or more of the following: automated exposure control, adjustment of mA and kV according to patient size, and/or iterative reconstruction. CONTRAST: Without and with intravenous contrast; 75 mL iohexol  (OMNIPAQUE ) 350 MG/ML  injection. COMPARISON: None provided. FINDINGS: BRAIN PARENCHYMA: Cerebral volume within normal limits. Patchy hypodensity involving the supratentorial cerebral white matter, most characteristic of chronic microvascular ischemic disease. No acute intracranial hemorrhage. No acute large vessel territory infarct. No mass lesion or midline shift. No hydrocephalus or axial shift or fluid collection. No abnormal hyperdense vessel. Calcified atherosclerosis present about the skull base. INTERNAL CAROTID ARTERIES: Mild atherosclerotic change about the carotid siphons without hemodynamically significant stenosis. A1 segments, anterior communicative complex, and anterior cerebral arteries are widely patent. No 1.1 cm occlusion. Dyscalcified branches perfusion asymmetric. No intracranial aneurysm. ANTERIOR CEREBRAL ARTERIES: Widely patent. No significant stenosis. No occlusion. No aneurysm. MIDDLE CEREBRAL ARTERIES: No significant stenosis. No occlusion. No aneurysm. POSTERIOR CEREBRAL ARTERIES: Patent bilaterally. No significant stenosis. No occlusion. No aneurysm. BASILAR ARTERY: Patent without stenosis. No occlusion. No aneurysm. VERTEBRAL ARTERIES: Both V4 segments patent without significant stenosis. Left vertebral artery dominant. Right PICA patent at its origin. Left PICA origin not well seen. No intracranial aneurysm. SOFT TISSUES: Scalp soft tissues demonstrate no acute finding. No masses or lymphadenopathy. Paranasal sinuses and mastoid air cells are largely clear. BONES: Calvarium intact. No acute osseous abnormality. IMPRESSION: 1. Negative CTA for acute large vessel occlusion or other emergent findings. No hemodynamically significant or correctable stenosis. No intracranial aneurysm. 2. No no other acute intracranial abnormality. 3. Chronic microvascular ischemic disease. . Electronically signed by: Morene Hoard MD 07/30/2024 10:18 PM EDT RP Workstation: [id number]    ROS: All others negative except  those  listed in HPI.   Physical Exam: Vitals:   07/31/24 0945 07/31/24 1000 07/31/24 1248 07/31/24 1339  BP: 122/87 134/87  (!) 107/57  Pulse: 78 78  91  Resp: 18 17  17   Temp:   98.3 F (36.8 C)   TempSrc:   Oral   SpO2: 96% 95%  96%  Weight:      Height:         General: Awake, alert, on RA, NAD Head: Sclera not icteric  Lungs: CTA bilaterally. No wheeze, rales or rhonchi. Breathing is unlabored. Heart: RRR. No murmur, rubs or gallops.  Abdomen: soft and non-tender  Neuro: AAOx3. Moves all extremities spontaneously. Dialysis Access: PD cather LUQ  Dialysis Orders:  [facility] 7x week-[facility] Primary Nephrologist Dr. Norine 5 exchanges EDW 79.5kg Fill volume  1.5hrs dwell time 1/2 1.5% bags; 1/2 2.5% bags (per patient)  Last Labs: Hgb 11.9, K 4.5, Ca 8.3, Alb 2.6  Assessment/Plan: Concern for migraine - new onset, started on 10/13. CT neck/head and MRV stable. Awaiting neuro recs ESRD -  on PD, compliant with treatment with no issues at home. Plan to resume PD while here. Treatment cannot start until she is on a medical floor. Under EDW here. Will use all 1.5% bags tonight.  Hypertension/volume  - Blood pressure seems to be stabilizing-watch trend Anemia of CKD - Hgb 11.9. Fe/ESA is not indicated at this time Secondary Hyperparathyroidism -  Corr Ca ok, checking phos in AM Nutrition - Renal diet with fluid restriction  Charmaine Piety, NP [facility] 07/31/2024, 5:26 PM

## 2024-07-31 NOTE — Assessment & Plan Note (Signed)
 Blood sugar in 100s SSI A1c

## 2024-07-31 NOTE — Assessment & Plan Note (Signed)
 WBC 16 on presentation  Noted overlapping headache/migrainous symptoms on presentation  Grossly non meningeal symptoms at present- though pending formal neurology evaluation  Recent prednisone  use is a confounding issue WBC trend also appears to be 11-13 at baseline  Blood cultures x 1 Will otherwise monitor and trend for now

## 2024-07-31 NOTE — Progress Notes (Deleted)
 [facility] [address] [place] [address] Dept: [phone] Dept Fax: [phone]  Acute Care Office Visit  Subjective:   [name] [facility] [birth year] 07/31/2024  No chief complaint on file.   HPI:   The following portions of the patient's history were reviewed and updated as appropriate: past medical history, past surgical history, family history, social history, allergies, medications, and problem list.   Patient Active Problem List   Diagnosis Date Noted   Migraine 07/31/2024   Leukocytosis 07/31/2024   Hallucinations 06/03/2024   Acute left-sided low back pain 03/29/2024   Localized skin mass, lump, or swelling 03/29/2024   Vaginal itching 01/18/2024   H/O aortic aneurysm repair 08/25/2023   Thoracic back pain 03/07/2023   At risk for sleep apnea 03/07/2023   Hypothyroidism 02/13/2023   History of aortic dissection 01/29/2023   Chronic pain of both knees 07/29/2022   Chronic illness 10/27/2021   Paresthesias 06/30/2021   Chronic pain of right hand 06/30/2021   Chronic low back pain 06/30/2021   Voice hoarseness 12/15/2020   Postoperative breast asymmetry 02/28/2020   Chronic gout 01/27/2020   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   CKD (chronic kidney disease) stage V requiring chronic dialysis (HCC) 07/12/2019   Normocytic anemia 07/12/2019   Preventative health care 09/10/2018   Port-A-Cath in place 07/12/2018   Family history of breast cancer    Family history of prostate cancer    Family history of lung cancer    Malignant neoplasm of lower-inner quadrant of right breast of female, estrogen receptor positive (HCC) 05/10/2018   Hyperlipidemia 04/26/2017   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   GAD (generalized anxiety disorder) 04/11/2017   Laryngopharyngeal reflux (LPR) 11/24/2016   Obesity (BMI 30-39.9) 10/14/2016   Allergic rhinitis 02/18/2016   Type 2  diabetes mellitus with hyperlipidemia (HCC) 06/23/2015   Hidradenitis suppurativa 01/30/2012   Sarcoidosis 02/08/2007   Cigarette smoker 02/08/2007   HIV (human immunodeficiency virus infection) (HCC) 07/24/2006   Depression 07/24/2006   Hypertension 07/24/2006   Asthma 07/24/2006   Past Medical History:  Diagnosis Date   Acute pain of right shoulder due to trauma 06/08/2023   Acute pancreatitis 10/23/2021   Acute renal failure superimposed on stage 4 chronic kidney disease (HCC) 10/24/2021   Allergy    Anemia    Normocytic   Antibiotic-induced yeast infection 09/28/2020   Anxiety    Asthma    Bronchitis 2005   Bursitis of left shoulder 09/06/2019   CKD (chronic kidney disease)    Dialysis on Mon-Wed- Fri   CLASS 1-EXOPHTHALMOS-THYROTOXIC 02/08/2007   Cognitive dysfunction 02/21/2020   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  Genes Analyzed (43 total): APC*, ATM*, AXIN2, BARD1, BMPR1A, BRCA1*, BRCA2*, BRIP1*, CDH1*, CDK4, CDKN2A, CHEK2*, DICER1, GALNT12, HOXB13, MEN1, MLH1*, MRE11A, MSH2*, MSH3, MSH6*, MUTYH*, NBN, NF1*, NTHL1, PALB2*, PMS2*, POLD1,  POLE, PTEN*, RAD50, RAD51C*, RAD51D*, RET, SDHB, SDHD, SMAD4, SMARCA4, STK11 and TP53* (sequencing and deletion/duplication); EGFR (s   GERD 07/24/2006   GRAVE'S DISEASE 01/01/2008   History of hidradenitis suppurativa    History of kidney stones     History of thrush    HIV DISEASE 07/24/2006   dx March 05   Hyperlipidemia    HYPERTENSION 07/24/2006   Hyperthyroidism 08/2006   Grave's Disease -diffuse radiotracer uptake 08/25/06 Thyroid  scan-Cold nodule to R lower lobe of thyrorid   Ileus (HCC) 02/14/2023   Left bundle branch block (LBBB)    Malnutrition of moderate degree 02/08/2023   Menometrorrhagia    hx of   Nephrolithiasis    Panniculitis 05/12/2020   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 Completed 2 yrs Prednisone  after lung bx confirmation. No symptoms since then.   Sebaceous cyst 04/21/2020   Suppurative hidradenitis    Thyroid  cancer (HCC)    THYROID  NODULE, RIGHT 02/08/2007   Past Surgical History:  Procedure Laterality Date   AORTIC ARCH DEBRANCHING N/A 08/25/2023   Procedure: AORTIC ARCH DEBRANCHING USING 12X8X8MM HEMASHIELD GRAFT;  Surgeon: Lucas Dorise POUR, MD;  Location: [facility] OR;  Service: Open Heart Surgery;  Laterality: N/A;  with bypasses to the innominate and left common carotid arteries   APPLICATION OF WOUND VAC N/A 01/20/2021   Procedure: APPLICATION OF WOUND VAC;  Surgeon: Lowery Estefana RAMAN, DO;  Location: [facility];  Service: Plastics;  Laterality: N/A;   BREAST EXCISIONAL BIOPSY Right 04/26/2018   right axilla negative   BREAST EXCISIONAL BIOPSY Left 04/26/2018   left axilla negative   BREAST LUMPECTOMY Right  10/03/2018   malignant   BREAST LUMPECTOMY WITH RADIOACTIVE SEED AND SENTINEL LYMPH NODE BIOPSY Right 10/03/2018   Procedure: RIGHT BREAST LUMPECTOMY WITH RADIOACTIVE SEED AND SENTINEL LYMPH NODE MAPPING;  Surgeon: Vanderbilt Ned, MD;  Location: [facility] OR;  Service: General;  Laterality: Right;   BREAST SURGERY  1997   Breast Reduction    CAPD INSERTION N/A 12/21/2023   Procedure: LAPAROSCOPIC INSERTION CONTINUOUS AMBULATORY PERITONEAL DIALYSIS  (CAPD) CATHETER; LAPAROSCOPIC OMENTUMPEXY;  Surgeon: Magda Ned SAILOR, MD;  Location: [facility];  Service: Vascular;  Laterality: N/A;   CYSTOSCOPY W/ URETERAL STENT REMOVAL  11/09/2012   Procedure: CYSTOSCOPY WITH STENT REMOVAL;  Surgeon: Ricardo Likens, MD;  Location: [facility];  Service: Urology;  Laterality: Right;   CYSTOSCOPY WITH RETROGRADE PYELOGRAM, URETEROSCOPY AND STENT PLACEMENT  11/09/2012   Procedure: CYSTOSCOPY WITH RETROGRADE PYELOGRAM, URETEROSCOPY AND STENT PLACEMENT;  Surgeon: Ricardo Likens, MD;  Location: [facility];  Service: Urology;  Laterality: Left;  LEFT URETEROSCOPY, STONE MANIPULATION, left STENT exchange    CYSTOSCOPY WITH STENT PLACEMENT  10/02/2012   Procedure: CYSTOSCOPY WITH STENT PLACEMENT;  Surgeon: Ricardo Likens, MD;  Location: [facility];  Service: Urology;  Laterality: Left;   DEBRIDEMENT AND CLOSURE WOUND N/A 01/20/2021   Procedure: Excision of abdominal  wound with closure;  Surgeon: Lowery Estefana RAMAN, DO;  Location: [facility];  Service: Plastics;  Laterality: N/A;   DILATION AND CURETTAGE OF UTERUS  11/2002   s/p for 1st trimester nonviable pregnancy   EYE SURGERY     sty under eyelid   INCISE AND DRAIN ABCESS  08/2002   s/p I &D for righ inframmary fold hidradenitis   INCISION AND DRAINAGE PERITONSILLAR ABSCESS  12/2001   IR CV LINE INJECTION  06/07/2018   IR FLUORO GUIDE CV LINE RIGHT  01/30/2023   IR IMAGING GUIDED PORT INSERTION  06/20/2018   IR REMOVAL TUN ACCESS W/ PORT W/O FL MOD SED  06/20/2018   IR  US  GUIDE VASC ACCESS RIGHT  01/30/2023   IRRIGATION AND DEBRIDEMENT ABSCESS  01/31/2012   Procedure: IRRIGATION AND DEBRIDEMENT ABSCESS;  Surgeon: Alm VEAR Angle, MD;  Location: [facility];  Service: General;  Laterality: Right;  right breast and axilla    LAPAROSCOPIC REPOSITIONING CAPD CATHETER Left 01/10/2024   Procedure: Removal of peritoneal dialysis Catheter.;  Surgeon: Magda Debby SAILOR, MD;  Location: [facility] OR;  Service: Vascular;  Laterality: Left;   LAPAROSCOPY N/A 01/10/2024   Procedure: LAPAROSCOPY, DIAGNOSTIC;  Surgeon: Magda Debby SAILOR, MD;  Location: [facility];  Service: Vascular;  Laterality: N/A;   NEPHROLITHOTOMY  10/02/2012   Procedure: NEPHROLITHOTOMY PERCUTANEOUS;  Surgeon: Ricardo Likens, MD;  Location[facility];  Service: Urology;  Laterality: Right;  First Stage Percutaneous Nephrolithotomy with Surgeon Access, Left Ureteral Stent     NEPHROLITHOTOMY  10/04/2012   Procedure: NEPHROLITHOTOMY PERCUTANEOUS SECOND LOOK;  Surgeon: Ricardo Likens, MD;  [facility];  Service: Urology;  Laterality: Right;      NEPHROLITHOTOMY  10/08/2012   Procedure: NEPHROLITHOTOMY PERCUTANEOUS;  Surgeon: Ricardo Likens, MD;  [facility];  Service: Urology;  Laterality: Right;  THIRD STAGE, nephrostomy tube exchange x 2   NEPHROLITHOTOMY  10/11/2012   Procedure: NEPHROLITHOTOMY PERCUTANEOUS SECOND LOOK;  Surgeon: Ricardo Likens, MD;  [facility]: [facility];  Service: Urology;  Laterality: Right;  RIGHT 4 STAGE PERCUTANOUS NEPHROLITHOTOMY, right URETEROSCOPY WITH HOLMIUM LASER    PANNICULECTOMY N/A 12/21/2020   Procedure: PANNICULECTOMY;  Surgeon: Lowery Estefana RAMAN, DO;  [facility];  Service: Plastics;  Laterality: N/A;   PERCUTANEOUS NEPHROSTOLITHOTOMY  04/2022   PORT-A-CATH REMOVAL N/A 07/16/2020   Procedure: REMOVAL PORT-A-CATH;  Surgeon: Vanderbilt Debby, MD;  Location: [facility];  Service: General;  Laterality: N/A;   PORTACATH PLACEMENT Left 05/17/2018   Procedure: INSERTION  PORT-A-CATH;  Surgeon: Vernetta Berg, MD;  Location: [facility];  Service: General;  Laterality: Left;   RADICAL NECK DISSECTION  04/12/2017   limited/notes 04/12/2017   RADICAL NECK DISSECTION N/A 04/12/2017   Procedure: RADICAL NECK DISSECTION;  Surgeon: Carlie Clark, MD;  Location: [facility] OR;  Service: ENT;  Laterality: N/A;  limited neck dissection 2 hours total   REDUCTION MAMMAPLASTY Bilateral 1998   RIGHT/LEFT HEART CATH AND CORONARY ANGIOGRAPHY N/A 03/12/2020   Procedure: RIGHT/LEFT HEART CATH AND CORONARY ANGIOGRAPHY;  Surgeon: Cherrie Toribio SAUNDERS, MD;  [facility];  Service: Cardiovascular;  Laterality: N/A;   [place]  1994   TEE WITHOUT CARDIOVERSION N/A 08/25/2023   Procedure: TRANSESOPHAGEAL ECHOCARDIOGRAM;  Surgeon: Lucas Dorise POUR, MD;  Location: [facility];  Service: Open Heart Surgery;  Laterality: N/A;   TENCKHOFF CATHETER INSERTION Left 01/10/2024   Procedure: INSERTION, CATHETER, DIALYSIS, PERITONEAL;  Surgeon: Magda Debby SAILOR, MD;  Location: [facility];  Service: Vascular;  Laterality: Left;   THORACIC AORTIC ENDOVASCULAR STENT GRAFT N/A 02/02/2023   Procedure: THORACIC AORTIC ENDOVASCULAR STENT GRAFT;  Surgeon: Lanis Fonda BRAVO, MD;  Location[facility];  Service: Vascular;  Laterality: N/A;   THORACIC AORTIC ENDOVASCULAR STENT GRAFT N/A 08/25/2023   Procedure: THORACIC AORTIC ENDOVASCULAR STENT GRAFT;  Surgeon: Lanis Fonda BRAVO, MD;  Location: [facility] OR;  Service: Vascular;  Laterality: N/A;   THYROIDECTOMY  04/12/2017   completion/notes 04/12/2017   THYROIDECTOMY N/A 04/12/2017   Procedure: THYROIDECTOMY;  Surgeon: Carlie Clark, MD;  Location: [facility] OR;  Service: ENT;  Laterality: N/A;  Completion Thyroidectomy   TOTAL THYROIDECTOMY  2010   ULTRASOUND GUIDANCE FOR VASCULAR ACCESS Right 08/25/2023   Procedure: ULTRASOUND GUIDANCE FOR VASCULAR ACCESS, RIGHT FEMORAL ARTERY;  Surgeon: Lanis Fonda BRAVO, MD;  Location: [facility] OR;  Service: Vascular;  Laterality: Right;    Family History  Problem Relation Age of Onset   Hypertension Mother    Cancer Mother        laryngeal   Heart disease Mother        stent   Pancreatic cancer Father    Hypertension Father    Lung cancer Father 73       hx smoking   Hypertension Sister    Hypertension Sister    Hypertension Brother    Breast cancer Maternal Aunt 67   Breast cancer Maternal Aunt        dx 60+   Cancer Maternal Uncle        Lung CA   Breast cancer Paternal Aunt 54   Breast cancer Paternal Aunt        dx 7's   Breast cancer Paternal Aunt        dx 50's   Prostate cancer Paternal Uncle    Prostate cancer Paternal Uncle    Lung cancer Paternal Uncle    Breast cancer Cousin 48   Breast cancer Cousin        dx <50   Breast cancer Cousin        dx <50   Breast cancer Cousin        dx <50   Heart disease Other    Hypertension Other    Stroke Other        Grandparent   Kidney disease Other        Grandparent   Diabetes Other        FH of Diabetes   Colon cancer Neg Hx    Esophageal cancer Neg Hx    Rectal cancer Neg Hx    Stomach cancer Neg Hx    No current facility-administered medications for this visit.  Current Outpatient Medications:    acetaminophen  (TYLENOL ) 500 MG tablet, Take 1,000 mg by mouth every 6 (six) hours as needed for mild pain., Disp: , Rfl:    albuterol  (VENTOLIN  HFA) 108 (90 Base) MCG/ACT inhaler, INHALE 2 PUFFS INTO THE LUNGS UP TO EVERY 6HRS AS NEEDED FOR WHEEZING/SHORTNESS OF BREATH, Disp: 8 each, Rfl: 0   allopurinol  (ZYLOPRIM ) 100 MG tablet, TAKE 1 TABLET (100 MG TOTAL) BY MOUTH DAILY. FOR GOUT PREVENTION, Disp: 90 tablet, Rfl: 2   amLODipine  (NORVASC ) 10 MG tablet, Take 10 mg by mouth daily., Disp: , Rfl:    aspirin  EC 81 MG tablet, Take 81 mg by mouth daily., Disp: , Rfl:    AURYXIA  1 GM 210 MG(Fe) tablet, Take 210 mg by mouth 3 (three) times daily with meals., Disp: , Rfl:  Blood Glucose Monitoring Suppl (FREESTYLE LITE) w/Device KIT, Inject 1 each into  the skin daily., Disp: 1 kit, Rfl: 0   busPIRone  (BUSPAR ) 5 MG tablet, Take 1 tablet (5 mg total) by mouth 2 (two) times daily. For anxiety, Disp: 180 tablet, Rfl: 0   carvedilol  (COREG ) 12.5 MG tablet, Take 12.5 mg by mouth 2 (two) times daily., Disp: , Rfl:    dolutegravir  ([name] ) 50 MG tablet, Take 1 tablet (50 mg total) by mouth daily. (Patient not taking: Reported on 06/27/2024), Disp: 30 tablet, Rfl: 11   emtricitabine -tenofovir  AF (DESCOVY ) 200-25 MG tablet, Take 1 tablet by mouth daily., Disp: 30 tablet, Rfl: 11   fluticasone  (FLOVENT  HFA) 110 MCG/ACT inhaler, Inhale 1 puff into the lungs 2 (two) times daily., Disp: , Rfl:    HYDROcodone -acetaminophen  (NORCO/VICODIN) 5-325 MG tablet, Take 2 tablets by mouth every 4 (four) hours as needed., Disp: 10 tablet, Rfl: 0   Lancets (ONETOUCH ULTRASOFT) lancets, Use as instructed to test blood sugar daily, Disp: 100 each, Rfl: 5   levocetirizine (XYZAL ) 5 MG tablet, TAKE 1 TABLET BY MOUTH EVERY DAY IN THE EVENING FOR ALLERGIES, Disp: 90 tablet, Rfl: 1   lidocaine  (LIDODERM ) 5 %, PLACE 1 PATCH ONTO THE SKIN DAILY. REMOVE & DISCARD PATCH WITHIN 12 HOURS OR AS DIRECTED BY MD (Patient taking differently: Place 1 patch onto the skin daily as needed (for pain).), Disp: 30 patch, Rfl: 0   losartan  (COZAAR ) 50 MG tablet, TAKE 1 TABLET (50 MG TOTAL) BY MOUTH DAILY FOR BLOOD PRESSURE., Disp: 90 tablet, Rfl: 1   minocycline  (MINOCIN ) 100 MG capsule, Take 1 capsule (100 mg total) by mouth 2 (two) times daily., Disp: 60 capsule, Rfl: 5   omeprazole  (PRILOSEC) 40 MG capsule, Take 1 capsule (40 mg total) by mouth daily. for heartburn., Disp: 90 capsule, Rfl: 2   ondansetron  (ZOFRAN -ODT) 4 MG disintegrating tablet, Take 1 tablet (4 mg total) by mouth every 8 (eight) hours as needed for nausea or vomiting., Disp: 20 tablet, Rfl: 0   predniSONE  (STERAPRED UNI-PAK 21 TAB) 10 MG (21) TBPK tablet, Take by mouth daily. Take 6 tabs by mouth daily  for 2 days, then 5 tabs for  2 days, then 4 tabs for 2 days, then 3 tabs for 2 days, 2 tabs for 2 days, then 1 tab by mouth daily for 2 days (Patient not taking: Reported on 07/30/2024), Disp: 42 tablet, Rfl: 0   rosuvastatin  (CRESTOR ) 10 MG tablet, TAKE 1 TABLET BY MOUTH EVERY DAY FOR CHOLESTEROL, Disp: 90 tablet, Rfl: 2   saxagliptin  HCl (ONGLYZA) 2.5 MG TABS tablet, TAKE 1 TABLET (2.5 MG TOTAL) BY MOUTH DAILY. FOR DIABETES., Disp: 90 tablet, Rfl: 0   SYNTHROID  150 MCG tablet, TAKE 1 TABLET BY MOUTH DAILY BEFORE BREAKFAST., Disp: 90 tablet, Rfl: 1   traMADol  (ULTRAM ) 50 MG tablet, Take 1 tablet (50 mg total) by mouth every 12 (twelve) hours as needed. (Patient taking differently: Take 50 mg by mouth every 12 (twelve) hours as needed for moderate pain (pain score 4-6).), Disp: 15 tablet, Rfl: 0  Facility-Administered Medications Ordered in Other Visits:    albuterol  (PROVENTIL ) (2.5 MG/3ML) 0.083% nebulizer solution 3 mL, 3 mL, Inhalation, Q4H PRN, Eldonna Elspeth PARAS, MD   allopurinol  (ZYLOPRIM ) tablet 100 mg, 100 mg, Oral, Daily, Eldonna Elspeth PARAS, MD, 100 mg at 07/31/24 1008   aspirin  EC tablet 81 mg, 81 mg, Oral, Daily, Eldonna Elspeth PARAS, MD, 81 mg at 07/31/24 1007   emtricitabine -tenofovir  AF (  DESCOVY ) 200-25 MG per tablet 1 tablet, 1 tablet, Oral, Daily, Eldonna Elspeth PARAS, MD, 1 tablet at 07/31/24 1006   ferric citrate  (AURYXIA ) tablet 210 mg, 210 mg, Oral, TID WC, Eldonna Elspeth PARAS, MD   fluticasone  (FLOVENT  HFA) 110 MCG/ACT inhaler 1 puff, 1 puff, Inhalation, BID, Eldonna Elspeth PARAS, MD   insulin  aspart (novoLOG ) injection 0-6 Units, 0-6 Units, Subcutaneous, TID WC, Eldonna Elspeth PARAS, MD   [START ON 08/01/2024] levothyroxine  (SYNTHROID ) tablet 150 mcg, 150 mcg, Oral, QAC breakfast, Eldonna Elspeth PARAS, MD   minocycline  (MINOCIN ) capsule 100 mg, 100 mg, Oral, BID, Eldonna Elspeth PARAS, MD, 100 mg at 07/31/24 1007   pantoprazole  (PROTONIX ) EC tablet 40 mg, 40 mg, Oral, Daily, Eldonna Elspeth PARAS, MD, 40 mg at 07/31/24 1008   rosuvastatin   (CRESTOR ) tablet 10 mg, 10 mg, Oral, Daily, Eldonna Elspeth PARAS, MD, 10 mg at 07/31/24 1006 Allergies  Allergen Reactions   Genvoya [Elviteg-Cobic-Emtricit-Tenofaf] Hives   Lisinopril Cough   Aldactone  [Spironolactone ] Hives   Tegaderm Ag Mesh [Silver] Itching     ROS: A complete ROS was performed with pertinent positives/negatives noted in the HPI. The remainder of the ROS are negative.    Objective:   There were no vitals filed for this visit.  GENERAL: Well-appearing, in NAD. Well nourished.  SKIN: Pink, warm and dry. No rash.  HEENT:    HEAD: Normocephalic, non-traumatic.  EYES: Conjunctive pink without exudate.  EARS: External ear w/o redness, swelling, masses, or lesions. EAC clear. TM's intact, translucent w/o bulging, appropriate landmarks visualized.  NOSE: Septum midline w/o deformity. Nares patent, mucosa pink and non-inflamed w/o drainage. No sinus tenderness.  THROAT: Uvula midline. Oropharynx clear. Tonsils non-inflamed w/o exudate. Mucus membranes pink and moist.  NECK: Trachea midline. Full ROM w/o pain or tenderness. No lymphadenopathy.  RESPIRATORY: Chest wall symmetrical. Respirations even and non-labored. Breath sounds clear to auscultation bilaterally.  CARDIAC: S1, S2 present, regular rate and rhythm. Peripheral pulses 2+ bilaterally.  EXTREMITIES: Without clubbing, cyanosis, or edema.  NEUROLOGIC: Steady, even gait.  PSYCH/MENTAL STATUS: Alert, oriented x 3. Cooperative, appropriate mood and affect.    Results for orders placed or performed during the hospital encounter of 07/30/24  Resp panel by RT-PCR (RSV, Flu A&B, Covid) Anterior Nasal Swab   Specimen: Anterior Nasal Swab  Result Value Ref Range   SARS Coronavirus 2 by RT PCR NEGATIVE NEGATIVE   Influenza A by PCR NEGATIVE NEGATIVE   Influenza B by PCR NEGATIVE NEGATIVE   Resp Syncytial Virus by PCR NEGATIVE NEGATIVE  CBC with Differential/Platelet  Result Value Ref Range   WBC 16.1 (H) 4.0 - 10.5  K/uL   RBC 2.90 (L) 3.87 - 5.11 MIL/uL   Hemoglobin 10.8 (L) 12.0 - 15.0 g/dL   HCT 68.0 (L) 63.9 - 53.9 %   MCV 110.0 (H) 80.0 - 100.0 fL   MCH 37.2 (H) 26.0 - 34.0 pg   MCHC 33.9 30.0 - 36.0 g/dL   RDW 85.2 88.4 - 84.4 %   Platelets 200 150 - 400 K/uL   nRBC 0.0 0.0 - 0.2 %   Neutrophils Relative % 81 %   Neutro Abs 13.0 (H) 1.7 - 7.7 K/uL   Lymphocytes Relative 10 %   Lymphs Abs 1.6 0.7 - 4.0 K/uL   Monocytes Relative 9 %   Monocytes Absolute 1.4 (H) 0.1 - 1.0 K/uL   Eosinophils Relative 0 %   Eosinophils Absolute 0.0 0.0 - 0.5 K/uL   Basophils Relative 0 %  Basophils Absolute 0.0 0.0 - 0.1 K/uL   WBC Morphology See Note    RBC Morphology See Note    Smear Review See Note   Comprehensive metabolic panel  Result Value Ref Range   Sodium 136 135 - 145 mmol/L   Potassium 4.8 3.5 - 5.1 mmol/L   Chloride 93 (L) 98 - 111 mmol/L   CO2 23 22 - 32 mmol/L   Glucose, Bld 73 70 - 99 mg/dL   BUN 79 (H) 6 - 20 mg/dL   Creatinine, Ser 86.11 (H) 0.44 - 1.00 mg/dL   Calcium  8.3 (L) 8.9 - 10.3 mg/dL   Total Protein 6.4 (L) 6.5 - 8.1 g/dL   Albumin  2.6 (L) 3.5 - 5.0 g/dL   AST 62 (H) 15 - 41 U/L   ALT 90 (H) 0 - 44 U/L   Alkaline Phosphatase 157 (H) 38 - 126 U/L   Total Bilirubin 0.6 0.0 - 1.2 mg/dL   GFR, Estimated 3 (L) >60 mL/min   Anion gap 20 (H) 5 - 15  I-stat chem 8, ED (not at [facility], [facility] or [facility])  Result Value Ref Range   Sodium 136 135 - 145 mmol/L   Potassium 4.5 3.5 - 5.1 mmol/L   Chloride 97 (L) 98 - 111 mmol/L   BUN 78 (H) 6 - 20 mg/dL   Creatinine, Ser 86.39 (H) 0.44 - 1.00 mg/dL   Glucose, Bld 72 70 - 99 mg/dL   Calcium , Ion 1.03 (L) 1.15 - 1.40 mmol/L   TCO2 27 22 - 32 mmol/L   Hemoglobin 11.9 (L) 12.0 - 15.0 g/dL   HCT 64.9 (L) 63.9 - 53.9 %  CBG monitoring, ED  Result Value Ref Range   Glucose-Capillary 88 70 - 99 mg/dL      Assessment & Plan:    There are no diagnoses linked to this encounter. No orders of the defined types were placed in this  encounter.  No orders of the defined types were placed in this encounter.  Lab Orders  No laboratory test(s) ordered today   No images are attached to the encounter or orders placed in the encounter.  No follow-ups on file.   Rosina Senters, FNP

## 2024-07-31 NOTE — ED Provider Notes (Signed)
 I assumed care of this patient from previous provider.  Please see their note for further details of history, exam, and MDM.   Briefly patient is a 58 y.o. female who presented headache. Pending reassessment and MRV if no improvement.   Still no improvement despite meds. MRV ordered. Additional meds given.  MRV negative. Pain improved to 6/10. Still having nausea and vomiting.  Will admit for intractable N/V and headache.      Trine Raynell Moder, MD 07/31/24 [phone]

## 2024-07-31 NOTE — Assessment & Plan Note (Signed)
 Continue Synthroid 

## 2024-07-31 NOTE — Assessment & Plan Note (Signed)
Cont allopurinol

## 2024-07-31 NOTE — Consult Note (Signed)
 NEUROLOGY CONSULT NOTE   Date of service: July 31, 2024 Patient Name: [name] MRN:  [id number] DOB:  [birth year] Chief Complaint: headache Requesting Provider: Eldonna Elspeth PARAS, MD  History of Present Illness  Parisa Pinela is a 58 y.o. female with hx of HIV on therapy, HTN, DM, Grave's disease, prior DVT, aortic dissection status post-repair, ESRD on peritoneal dialysis, who presents to ED on 10/14 for CC of headache. Per EDP note, patient woke up at 2AM with a headache behind both eyes, her headache worsened throughout the day and she was having blurry vision, nausea, and trouble walking, as well as photophobia. She had recent URI symptoms, but no fever. She denies a history of headaches. Speaking with patient, she states that she went to bed on Sunday night and felt normal and then woke up early on Monday morning feeling pain in the frontal part of her head and face. She also endorses nausea, and vomited once after trying to eat a sandwich. She recently finished prednisone  Sunday evening. She states that the pain with walking is related to chronic back pain she has had for several months. She states that she had blurry vision but not in the present moment. She is reporting HA pain as an 8/10 and describes the pain as a heaviness. She denies any prior history of headaches and denies any family history of headaches. Patient endorses neck pain when turning head left and right. ROS  Comprehensive ROS performed and pertinent positives documented in HPI  Past History   Past Medical History:  Diagnosis Date   Acute pain of right shoulder due to trauma 06/08/2023   Acute pancreatitis 10/23/2021   Acute renal failure superimposed on stage 4 chronic kidney disease (HCC) 10/24/2021   Allergy    Anemia    Normocytic   Antibiotic-induced yeast infection 09/28/2020   Anxiety    Asthma    Bronchitis 2005   Bursitis of left shoulder 09/06/2019   CKD (chronic kidney disease)     Dialysis on Mon-Wed- Fri   CLASS 1-EXOPHTHALMOS-THYROTOXIC 02/08/2007   Cognitive dysfunction 02/21/2020   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  Genes Analyzed (43 total): APC*, ATM*, AXIN2, BARD1, BMPR1A, BRCA1*, BRCA2*, BRIP1*, CDH1*, CDK4, CDKN2A, CHEK2*, DICER1, GALNT12, HOXB13, MEN1, MLH1*, MRE11A, MSH2*, MSH3, MSH6*, MUTYH*, NBN, NF1*, NTHL1, PALB2*, PMS2*, POLD1, POLE, PTEN*, RAD50, RAD51C*, RAD51D*, RET, SDHB, SDHD, SMAD4, SMARCA4, STK11 and TP53* (sequencing and deletion/duplication); EGFR (s   GERD 07/24/2006   GRAVE'S DISEASE 01/01/2008   History of hidradenitis suppurativa    History of kidney stones    History of thrush    HIV DISEASE 07/24/2006   dx March 05   Hyperlipidemia    HYPERTENSION 07/24/2006   Hyperthyroidism 08/2006   Grave's Disease -diffuse radiotracer uptake 08/25/06 Thyroid  scan-Cold nodule to R lower lobe of thyrorid   Ileus (HCC) 02/14/2023   Left bundle  branch block (LBBB)    Malnutrition of moderate degree 02/08/2023   Menometrorrhagia    hx of   Nephrolithiasis    Panniculitis 05/12/2020   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 Completed 2 yrs Prednisone  after lung bx confirmation. No symptoms since then.   Sebaceous cyst 04/21/2020   Suppurative hidradenitis    Thyroid  cancer (HCC)    THYROID  NODULE, RIGHT 02/08/2007    Past Surgical History:  Procedure Laterality Date   AORTIC ARCH DEBRANCHING N/A 08/25/2023   Procedure: AORTIC ARCH DEBRANCHING USING 12X8X8MM HEMASHIELD GRAFT;  Surgeon: Lucas Dorise POUR, MD;  Location: [facility];  Service: Open Heart Surgery;  Laterality: N/A;  with bypasses to the innominate and left common carotid arteries   APPLICATION OF WOUND VAC N/A 01/20/2021   Procedure: APPLICATION OF WOUND VAC;  Surgeon: Lowery Estefana RAMAN, DO;  Location: [facility];  Service: Plastics;  Laterality: N/A;   BREAST EXCISIONAL BIOPSY Right 04/26/2018   right axilla negative   BREAST EXCISIONAL BIOPSY Left 04/26/2018   left axilla negative   BREAST LUMPECTOMY Right 10/03/2018   malignant   BREAST LUMPECTOMY WITH RADIOACTIVE SEED AND SENTINEL LYMPH NODE BIOPSY Right 10/03/2018   Procedure: RIGHT BREAST LUMPECTOMY WITH RADIOACTIVE SEED AND SENTINEL LYMPH NODE MAPPING;  Surgeon: Vanderbilt Ned, MD;  Location: [facility];  Service: General;  Laterality: Right;   BREAST SURGERY  1997   Breast Reduction    CAPD INSERTION N/A 12/21/2023   Procedure: LAPAROSCOPIC INSERTION CONTINUOUS AMBULATORY PERITONEAL DIALYSIS  (CAPD) CATHETER; LAPAROSCOPIC OMENTUMPEXY;  Surgeon: Magda Ned SAILOR, MD;  Location: [facility];  Service: Vascular;  Laterality: N/A;   CYSTOSCOPY W/ URETERAL STENT REMOVAL  11/09/2012   Procedure: CYSTOSCOPY WITH STENT REMOVAL;  Surgeon: Ricardo Likens, MD;   Location: [facility];  Service: Urology;  Laterality: Right;   CYSTOSCOPY WITH RETROGRADE PYELOGRAM, URETEROSCOPY AND STENT PLACEMENT  11/09/2012   Procedure: CYSTOSCOPY WITH RETROGRADE PYELOGRAM, URETEROSCOPY AND STENT PLACEMENT;  Surgeon: Ricardo Likens, MD;  Location: [facility];  Service: Urology;  Laterality: Left;  LEFT URETEROSCOPY, STONE MANIPULATION, left STENT exchange    CYSTOSCOPY WITH STENT PLACEMENT  10/02/2012   Procedure: CYSTOSCOPY WITH STENT PLACEMENT;  Surgeon: Ricardo Likens, MD;  Location: [facility];  Service: Urology;  Laterality: Left;   DEBRIDEMENT AND CLOSURE WOUND N/A 01/20/2021   Procedure: Excision of abdominal wound with closure;  Surgeon: Lowery Estefana RAMAN, DO;  Location: [facility];  Service: Plastics;  Laterality: N/A;   DILATION AND CURETTAGE OF UTERUS  11/2002   s/p for 1st trimester nonviable pregnancy   EYE SURGERY     sty under eyelid   INCISE AND DRAIN ABCESS  08/2002   s/p I &D for righ inframmary fold hidradenitis   INCISION AND DRAINAGE PERITONSILLAR ABSCESS  12/2001   IR CV LINE INJECTION  06/07/2018   IR FLUORO GUIDE CV LINE RIGHT  01/30/2023  IR IMAGING GUIDED PORT INSERTION  06/20/2018   IR REMOVAL TUN ACCESS W/ PORT W/O FL MOD SED  06/20/2018   IR US  GUIDE VASC ACCESS RIGHT  01/30/2023   IRRIGATION AND DEBRIDEMENT ABSCESS  01/31/2012   Procedure: IRRIGATION AND DEBRIDEMENT ABSCESS;  Surgeon: Alm VEAR Angle, MD;  [facility]: [facility];  Service: General;  Laterality: Right;  right breast and axilla    LAPAROSCOPIC REPOSITIONING CAPD CATHETER Left 01/10/2024   Procedure: Removal of peritoneal dialysis Catheter.;  Surgeon: Magda Debby SAILOR, MD;  Location: [facility] OR;  Service: Vascular;  Laterality: Left;   LAPAROSCOPY N/A 01/10/2024   Procedure: LAPAROSCOPY, DIAGNOSTIC;  Surgeon: Magda Debby SAILOR, MD;  Location: [facility] OR;  Service: Vascular;  Laterality: N/A;   NEPHROLITHOTOMY  10/02/2012   Procedure: NEPHROLITHOTOMY PERCUTANEOUS;  Surgeon: Ricardo Likens, MD;  [facility]: [facility];  Service: Urology;  Laterality: Right;  First Stage Percutaneous Nephrolithotomy with Surgeon Access, Left Ureteral Stent     NEPHROLITHOTOMY  10/04/2012   Procedure: NEPHROLITHOTOMY PERCUTANEOUS SECOND LOOK;  Surgeon: Ricardo Likens, MD;  [facility];  Service: Urology;  Laterality: Right;      NEPHROLITHOTOMY  10/08/2012   Procedure: NEPHROLITHOTOMY PERCUTANEOUS;  Surgeon: Ricardo Likens, MD;  [facility]: [facility];  Service: Urology;  Laterality: Right;  THIRD STAGE, nephrostomy tube exchange x 2   NEPHROLITHOTOMY  10/11/2012   Procedure: NEPHROLITHOTOMY PERCUTANEOUS SECOND LOOK;  Surgeon: Ricardo Likens, MD;  [facility]: [facility];  Service: Urology;  Laterality: Right;  RIGHT 4 STAGE PERCUTANOUS NEPHROLITHOTOMY, right URETEROSCOPY WITH HOLMIUM LASER    PANNICULECTOMY N/A 12/21/2020   Procedure: PANNICULECTOMY;  Surgeon: Lowery Estefana RAMAN, DO;  [facility];  Service: Plastics;  Laterality: N/A;   PERCUTANEOUS NEPHROSTOLITHOTOMY  04/2022   PORT-A-CATH REMOVAL N/A 07/16/2020   Procedure: REMOVAL PORT-A-CATH;  Surgeon: Vanderbilt Debby, MD;  Location: [facility];  Service: General;  Laterality: N/A;   PORTACATH PLACEMENT Left 05/17/2018   Procedure: INSERTION PORT-A-CATH;  Surgeon: Vernetta Berg, MD;  Location: [facility];  Service: General;  Laterality: Left;   RADICAL NECK DISSECTION  04/12/2017   limited/notes 04/12/2017   RADICAL NECK DISSECTION N/A 04/12/2017   Procedure: RADICAL NECK DISSECTION;  Surgeon: Carlie Clark, MD;  Location: [facility];  Service: ENT;  Laterality: N/A;  limited neck dissection 2 hours total   REDUCTION MAMMAPLASTY Bilateral 1998   RIGHT/LEFT HEART CATH AND CORONARY ANGIOGRAPHY N/A 03/12/2020   Procedure: RIGHT/LEFT HEART CATH AND CORONARY ANGIOGRAPHY;  Surgeon: Cherrie Toribio SAUNDERS, MD;  [facility];  Service: Cardiovascular;  Laterality: N/A;   [place]  1994   TEE WITHOUT CARDIOVERSION  N/A 08/25/2023   Procedure: TRANSESOPHAGEAL ECHOCARDIOGRAM;  Surgeon: Lucas Dorise POUR, MD;  Location: [facility];  Service: Open Heart Surgery;  Laterality: N/A;   TENCKHOFF CATHETER INSERTION Left 01/10/2024   Procedure: INSERTION, CATHETER, DIALYSIS, PERITONEAL;  Surgeon: Magda Debby SAILOR, MD;  Location: [facility];  Service: Vascular;  Laterality: Left;   THORACIC AORTIC ENDOVASCULAR STENT GRAFT N/A 02/02/2023   Procedure: THORACIC AORTIC ENDOVASCULAR STENT GRAFT;  Surgeon: Lanis Fonda BRAVO, MD;  Location: [facility] OR;  Service: Vascular;  Laterality: N/A;   THORACIC AORTIC ENDOVASCULAR STENT GRAFT N/A 08/25/2023   Procedure: THORACIC AORTIC ENDOVASCULAR STENT GRAFT;  Surgeon: Lanis Fonda BRAVO, MD;  Location: [facility] OR;  Service: Vascular;  Laterality: N/A;   THYROIDECTOMY  04/12/2017   completion/notes 04/12/2017   THYROIDECTOMY N/A 04/12/2017   Procedure: THYROIDECTOMY;  Surgeon: Carlie Clark, MD;  Location:  [facility];  Service: ENT;  Laterality: N/A;  Completion Thyroidectomy   TOTAL THYROIDECTOMY  2010   ULTRASOUND GUIDANCE FOR VASCULAR ACCESS Right 08/25/2023   Procedure: ULTRASOUND GUIDANCE FOR VASCULAR ACCESS, RIGHT FEMORAL ARTERY;  Surgeon: Lanis Fonda BRAVO, MD;  Location: [facility] OR;  Service: Vascular;  Laterality: Right;    Family History: Family History  Problem Relation Age of Onset   Hypertension Mother    Cancer Mother        laryngeal   Heart disease Mother        stent   Pancreatic cancer Father    Hypertension Father    Lung cancer Father 22       hx smoking   Hypertension Sister    Hypertension Sister    Hypertension Brother    Breast cancer Maternal Aunt 4   Breast cancer Maternal Aunt        dx 60+   Cancer Maternal Uncle        Lung CA   Breast cancer Paternal Aunt 24   Breast cancer Paternal Aunt        dx 26's   Breast cancer Paternal Aunt        dx 50's   Prostate cancer Paternal Uncle    Prostate cancer Paternal Uncle    Lung cancer Paternal Uncle    Breast cancer Cousin 43    Breast cancer Cousin        dx <50   Breast cancer Cousin        dx <50   Breast cancer Cousin        dx <50   Heart disease Other    Hypertension Other    Stroke Other        Grandparent   Kidney disease Other        Grandparent   Diabetes Other        FH of Diabetes   Colon cancer Neg Hx    Esophageal cancer Neg Hx    Rectal cancer Neg Hx    Stomach cancer Neg Hx     Social History  reports that she quit smoking about 6 years ago. Her smoking use included cigarettes. She started smoking about 7 years ago. She has a 7.5 pack-year smoking history. She has never used smokeless tobacco. She reports that she does not currently use alcohol . She reports that she does not use drugs.  Allergies  Allergen Reactions   Genvoya [Elviteg-Cobic-Emtricit-Tenofaf] Hives   Lisinopril Cough   Aldactone  [Spironolactone ] Hives   Tegaderm Ag Mesh [Silver] Itching    Medications   Current Facility-Administered Medications:    albuterol  (PROVENTIL ) (2.5 MG/3ML) 0.083% nebulizer solution 3 mL, 3 mL, Inhalation, Q4H PRN, Eldonna Elspeth PARAS, MD   allopurinol  (ZYLOPRIM ) tablet 100 mg, 100 mg, Oral, Daily, Eldonna Elspeth PARAS, MD, 100 mg at 07/31/24 1008   aspirin  EC tablet 81 mg, 81 mg, Oral, Daily, Eldonna Elspeth PARAS, MD, 81 mg at 07/31/24 1007   dialysis solution 1.5% low-MG/low-CA dianeal solution, , Intraperitoneal, Q24H, Eldonna Elspeth PARAS, MD   emtricitabine -tenofovir  AF (DESCOVY ) 200-25 MG per tablet 1 tablet, 1 tablet, Oral, Daily, Eldonna Elspeth PARAS, MD, 1 tablet at 07/31/24 1006   ferric citrate  (AURYXIA ) tablet 210 mg, 210 mg, Oral, TID WC, Eldonna Elspeth PARAS, MD   fluticasone  (FLOVENT  HFA) 110 MCG/ACT inhaler 1 puff, 1 puff, Inhalation, BID, Eldonna Elspeth PARAS, MD   gentamicin  cream (GARAMYCIN ) 0.1 % 1 Application, 1 Application, Topical, Daily, Lenon Charmaine BRAVO,  NP   heparin  sodium (porcine) 3,000 Units in dialysis solution 1.5% low-MG/low-CA 6,000 mL dialysis solution, , Peritoneal Dialysis,  PRN, Eldonna Elspeth PARAS, MD   insulin  aspart (novoLOG ) injection 0-6 Units, 0-6 Units, Subcutaneous, TID WC, Eldonna Elspeth PARAS, MD   [START ON 08/01/2024] levothyroxine  (SYNTHROID ) tablet 150 mcg, 150 mcg, Oral, QAC breakfast, Eldonna Elspeth PARAS, MD   metoCLOPramide  (REGLAN ) injection 5 mg, 5 mg, Intravenous, Q12H PRN, Eldonna Elspeth PARAS, MD   minocycline  (MINOCIN ) capsule 100 mg, 100 mg, Oral, BID, Eldonna Elspeth PARAS, MD, 100 mg at 07/31/24 1007   pantoprazole  (PROTONIX ) EC tablet 40 mg, 40 mg, Oral, Daily, Eldonna Elspeth PARAS, MD, 40 mg at 07/31/24 1008   prochlorperazine  (COMPAZINE ) injection 10 mg, 10 mg, Intravenous, Q6H PRN, Eldonna Elspeth PARAS, MD   rosuvastatin  (CRESTOR ) tablet 10 mg, 10 mg, Oral, Daily, Eldonna Elspeth PARAS, MD, 10 mg at 07/31/24 1006  Current Outpatient Medications:    acetaminophen  (TYLENOL ) 500 MG tablet, Take 1,000 mg by mouth every 6 (six) hours as needed for mild pain., Disp: , Rfl:    albuterol  (VENTOLIN  HFA) 108 (90 Base) MCG/ACT inhaler, INHALE 2 PUFFS INTO THE LUNGS UP TO EVERY 6HRS AS NEEDED FOR WHEEZING/SHORTNESS OF BREATH, Disp: 8 each, Rfl: 0   allopurinol  (ZYLOPRIM ) 100 MG tablet, TAKE 1 TABLET (100 MG TOTAL) BY MOUTH DAILY. FOR GOUT PREVENTION, Disp: 90 tablet, Rfl: 2   amLODipine  (NORVASC ) 10 MG tablet, Take 10 mg by mouth daily., Disp: , Rfl:    aspirin  EC 81 MG tablet, Take 81 mg by mouth daily., Disp: , Rfl:    AURYXIA  1 GM 210 MG(Fe) tablet, Take 210 mg by mouth 3 (three) times daily with meals., Disp: , Rfl:    busPIRone  (BUSPAR ) 5 MG tablet, Take 1 tablet (5 mg total) by mouth 2 (two) times daily. For anxiety, Disp: 180 tablet, Rfl: 0   carvedilol  (COREG ) 12.5 MG tablet, Take 12.5 mg by mouth 2 (two) times daily., Disp: , Rfl:    emtricitabine -tenofovir  AF (DESCOVY ) 200-25 MG tablet, Take 1 tablet by mouth daily., Disp: 30 tablet, Rfl: 11   fluticasone  (FLOVENT  HFA) 110 MCG/ACT inhaler, Inhale 1 puff into the lungs 2 (two) times daily., Disp: , Rfl:     HYDROcodone -acetaminophen  (NORCO/VICODIN) 5-325 MG tablet, Take 2 tablets by mouth every 4 (four) hours as needed., Disp: 10 tablet, Rfl: 0   levocetirizine (XYZAL ) 5 MG tablet, TAKE 1 TABLET BY MOUTH EVERY DAY IN THE EVENING FOR ALLERGIES, Disp: 90 tablet, Rfl: 1   lidocaine  (LIDODERM ) 5 %, PLACE 1 PATCH ONTO THE SKIN DAILY. REMOVE & DISCARD PATCH WITHIN 12 HOURS OR AS DIRECTED BY MD (Patient taking differently: Place 1 patch onto the skin daily as needed (for pain).), Disp: 30 patch, Rfl: 0   losartan  (COZAAR ) 50 MG tablet, TAKE 1 TABLET (50 MG TOTAL) BY MOUTH DAILY FOR BLOOD PRESSURE., Disp: 90 tablet, Rfl: 1   minocycline  (MINOCIN ) 100 MG capsule, Take 1 capsule (100 mg total) by mouth 2 (two) times daily., Disp: 60 capsule, Rfl: 5   omeprazole  (PRILOSEC) 40 MG capsule, Take 1 capsule (40 mg total) by mouth daily. for heartburn., Disp: 90 capsule, Rfl: 2   ondansetron  (ZOFRAN -ODT) 4 MG disintegrating tablet, Take 1 tablet (4 mg total) by mouth every 8 (eight) hours as needed for nausea or vomiting., Disp: 20 tablet, Rfl: 0   rosuvastatin  (CRESTOR ) 10 MG tablet, TAKE 1 TABLET BY MOUTH EVERY DAY FOR CHOLESTEROL, Disp: 90 tablet, Rfl: 2  saxagliptin  HCl (ONGLYZA) 2.5 MG TABS tablet, TAKE 1 TABLET (2.5 MG TOTAL) BY MOUTH DAILY. FOR DIABETES., Disp: 90 tablet, Rfl: 0   SYNTHROID  150 MCG tablet, TAKE 1 TABLET BY MOUTH DAILY BEFORE BREAKFAST., Disp: 90 tablet, Rfl: 1   traMADol  (ULTRAM ) 50 MG tablet, Take 1 tablet (50 mg total) by mouth every 12 (twelve) hours as needed. (Patient taking differently: Take 50 mg by mouth every 12 (twelve) hours as needed for moderate pain (pain score 4-6).), Disp: 15 tablet, Rfl: 0   Blood Glucose Monitoring Suppl (FREESTYLE LITE) w/Device KIT, Inject 1 each into the skin daily., Disp: 1 kit, Rfl: 0   dolutegravir  ([name] ) 50 MG tablet, Take 1 tablet (50 mg total) by mouth daily. (Patient not taking: Reported on 06/27/2024), Disp: 30 tablet, Rfl: 11   Lancets (ONETOUCH  ULTRASOFT) lancets, Use as instructed to test blood sugar daily, Disp: 100 each, Rfl: 5   predniSONE  (STERAPRED UNI-PAK 21 TAB) 10 MG (21) TBPK tablet, Take by mouth daily. Take 6 tabs by mouth daily  for 2 days, then 5 tabs for 2 days, then 4 tabs for 2 days, then 3 tabs for 2 days, 2 tabs for 2 days, then 1 tab by mouth daily for 2 days (Patient not taking: Reported on 17-Aug-2024), Disp: 42 tablet, Rfl: 0  Vitals   Vitals:   07/31/24 0945 07/31/24 1000 07/31/24 1248 07/31/24 1339  BP: 122/87 134/87  (!) 107/57  Pulse: 78 78  91  Resp: 18 17  17   Temp:   98.3 F (36.8 C)   TempSrc:   Oral   SpO2: 96% 95%  96%  Weight:      Height:        Body mass index is 28.98 kg/m.   Physical Exam   Constitutional: Appears to be in discomfort. Psych: Affect appropriate to situation.  Eyes: No scleral injection.  HENT: No OP obstruction. Patient endorses neck pain with turning head and with palpation. Head: Atraumatic. Symmetric proptosis bilaterally. Respiratory: Effort normal, non-labored breathing.  GI: Soft.  No distension. Skin: WDI.  Neurologic Examination   Mental Status: Patient is awake, alert, and able to provide adequate history surrounding chief complaint. PERRL, bilateral lateral field deficits, left sided deficit more prominent than right. EOMI Sensation intact to light touch bilaterally on face. Hearing intact to voice Symmetric palate raise with phonation intact. Chin turn and shoulder shrug intact bilaterally.  Tongue midline Motor: RUE: Biceps, triceps, and shoulder abduction 5/5 LUE: Biceps 4+/5 Triceps 4+/5 shoulder abduction 4/5 RLE: hip flexion 5/5, ankle plantar flexion and ankle dorsiflexion 5/5 LLE: hip flexion 4-/5, ankle plantar flexion and ankle dorsiflexion 5/5 (patient states this is limited due to back pain). Drift back towards bed noted in LLE with give way after 5 seconds. Sensory: Sensation intact to light touch on bilateral  extremities. Coordination: FTN intact bilaterally without ataxia HSK - unable to complete on left side due to associated back pain. Right leg intact without ataxia. Gait: deferred.   Labs/Imaging/Neurodiagnostic studies   CBC:  Recent Labs  Lab 17-Aug-2024 2001 2024/08/17 2019  WBC 16.1*  --   NEUTROABS 13.0*  --   HGB 10.8* 11.9*  HCT 31.9* 35.0*  MCV 110.0*  --   PLT 200  --    Basic Metabolic Panel:  Lab Results  Component Value Date   NA 136 08-17-2024   K 4.5 2024/08/17   CO2 23 17-Aug-2024   GLUCOSE 72 2024/08/17   BUN 78 (H) 08/17/24  CREATININE 13.60 (H) 07/30/2024   CALCIUM  8.3 (L) 07/30/2024   GFRNONAA 3 (L) 07/30/2024   GFRAA 39 (L) 07/14/2020   Lipid Panel:  Lab Results  Component Value Date   LDLCALC 59 01/11/2024   HgbA1c:  Lab Results  Component Value Date   HGBA1C 5.8 (A) 01/11/2024   Urine Drug Screen: No results found for: LABOPIA, COCAINSCRNUR, LABBENZ, AMPHETMU, THCU, LABBARB  Alcohol  Level No results found for: [facility] INR  Lab Results  Component Value Date   INR 1.4 (H) 08/29/2023   APTT  Lab Results  Component Value Date   APTT 43 (H) 08/29/2023   AED levels: No results found for: PHENYTOIN, ZONISAMIDE, LAMOTRIGINE, LEVETIRACETA  CT angio Head and Neck with contrast(Personally reviewed): 1. Negative CTA for acute large vessel occlusion or other emergent findings. No hemodynamically significant or correctable stenosis. No intracranial aneurysm. 2. No no other acute intracranial abnormality. 3. Chronic microvascular ischemic disease  MRI Brain wo Contrast: 1. 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 Recommend dedicated MRI of the pituitary/sella with and without contrast for further evaluation. 2. Mild chronic microvascular ischemic changes. 3. No acute findings.  MRV Head wo Contrast: Unremarkable MRV of the head.   IOP of the right eye: 10 with low suspicion for  glaucoma per EDP ASSESSMENT   Krystianna Soth Saravia is a 58 y.o. female hx of HIV on therapy, HTN, DM, Grave's disease, prior DVT, aortic dissection status post-repair, ESRD on peritoneal dialysis, who presents to ED on 10/14 for CC of headache that began in the early hours of Monday morning. Patient endorses nausea, and vomited once at home. She previously had blurry vision, but states it has resolved. Imaging showed sellar mass measuring up to 1.5cm. Recommend completing MRI with contrast for further evaluation. Suspicion for pituitary tumor as cause of her headache and limited visual temporal field. Discussed findings with patient and recommend completing repeat MRI with contrast when possible, patient was unable to tolerate imaging earlier.  RECOMMENDATIONS  -repeat MRI pituitary w/wo contrast when possible -Consultation to endocrine or neuro-endocrine. -Consult neurosurgery after repeat MRI. -Tordol 15mg  IM -Magnesium  -Tylenol  1g -Amitriptyline 12.5mg  nightly to start for HA management x 1 week, then increase to 25mg . -Please discuss with pharmacy prior for any drug interactions. ______________________________________________________________________    Bonney Nidia Bunker, Student-PA [facility]   NEUROHOSPITALIST ADDENDUM Performed a face to face diagnostic evaluation.   I have reviewed the contents of history and physical exam as documented by PA/ARNP/Resident and agree with above documentation.  I have discussed and formulated the above plan as documented. Edits to the note have been made as needed.  Raziah Funnell, MD [facility]

## 2024-07-31 NOTE — H&P (Addendum)
 History and Physical    Patient: [name] FMW:[id number] DOB: 06-12-1966 DOA: 07/30/2024 DOS: the patient was seen and examined on 07/31/2024 PCP: Gretta Comer POUR, NP  Patient coming from: Home  Chief Complaint:  Chief Complaint  Patient presents with   Migraine   HPI: [name] is a 58 y.o. female with medical history significant of sarcoidosis, ESRD on peritoneal dialysis, hidradenitis, HLD, Graves' disease status post thyroidectomy on Synthroid , hypertension, history of aortic dissection status post stent placement presenting with headache.  Patient reports progressive headache over the past roughly 4 to 5 days.  This is a new onset for the patient.  Patient reports headache being predominantly frontal as well as posterior chronic dull aching.  Mild rhinorrhea and nasal congestion.  No reported sick contacts.  Has had mild photophobia with symptoms.  No focal hemiparesis or confusion.  Mild blurry vision. Questionable mild neck stiffness with neck turning.  Mild nausea.  Baseline ESRD on peritoneal dialysis.  Has been compliant with daily sessions.  Still making small amount of urine.  No new medications apart from recent course of prednisone  for back pain.  Also reports being recently treated for UTI.  No abdominal pain or tenderness.  No slurred speech or confusion.  No reported alcohol  or tobacco use.  Baseline HIV.  Has been compliant with Haart therapy for the patient. Present to the ER afebrile, hemodynamically stable.  Satting well on room air.  White count 16.1, hemoglobin 10.8, platelets 200, creatinine 13.6.  AST 62, ALT 90.  COVID flu and RSV negative.  Urinalysis with positive leukocytes-asymptomatic.  CTA of the head negative for any LVO.  MRV also negative.  Was given multiple rounds of Phenergan  and Compazine  with mild improvement in symptoms. Review of Systems: As mentioned in the history of present illness. All other systems reviewed and are  negative. 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  Genes Analyzed (43 total): APC*, ATM*, AXIN2, BARD1, BMPR1A, BRCA1*, BRCA2*, BRIP1*, CDH1*, CDK4, CDKN2A, CHEK2*, DICER1, GALNT12, HOXB13, MEN1, MLH1*, MRE11A, MSH2*, MSH3, MSH6*, MUTYH*, NBN, NF1*, NTHL1, PALB2*, PMS2*, POLD1, POLE, PTEN*, RAD50, RAD51C*, RAD51D*, RET, SDHB, SDHD, SMAD4, SMARCA4, STK11 and TP53* (sequencing and deletion/duplication); EGFR (s   GERD 07/24/2006   GRAVE'S DISEASE 01/01/2008   History of hidradenitis suppurativa    History of kidney  stones    History of thrush    HIV DISEASE 07/24/2006   dx March 05   Hyperlipidemia    HYPERTENSION 07/24/2006   Hyperthyroidism 08/2006   Grave's Disease -diffuse radiotracer uptake 08/25/06 Thyroid  scan-Cold nodule to R lower lobe of thyrorid   Ileus (HCC) 02/14/2023   Left bundle  branch block (LBBB)    Malnutrition of moderate degree 02/08/2023   Menometrorrhagia    hx of   Nephrolithiasis    Panniculitis 05/12/2020   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 Completed 2 yrs Prednisone  after lung bx confirmation. No symptoms since then.   Sebaceous cyst 04/21/2020   Suppurative hidradenitis    Thyroid  cancer (HCC)    THYROID  NODULE, RIGHT 02/08/2007   Past Surgical History:  Procedure Laterality Date   AORTIC ARCH DEBRANCHING N/A 08/25/2023   Procedure: AORTIC ARCH DEBRANCHING USING 12X8X8MM HEMASHIELD GRAFT;  Surgeon: Lucas Dorise POUR, MD;  Location: [facility];  Service: Open Heart Surgery;  Laterality: N/A;  with bypasses to the innominate and left common carotid arteries   APPLICATION OF WOUND VAC N/A 01/20/2021   Procedure: APPLICATION OF WOUND VAC;  Surgeon: Lowery Estefana RAMAN, DO;  Location: [facility];  Service: Plastics;  Laterality: N/A;   BREAST EXCISIONAL BIOPSY Right 04/26/2018   right axilla negative   BREAST EXCISIONAL BIOPSY Left 04/26/2018   left axilla negative   BREAST LUMPECTOMY Right 10/03/2018   malignant   BREAST LUMPECTOMY WITH RADIOACTIVE SEED AND SENTINEL LYMPH NODE BIOPSY Right 10/03/2018   Procedure: RIGHT BREAST LUMPECTOMY WITH RADIOACTIVE SEED AND SENTINEL LYMPH NODE MAPPING;  Surgeon: Vanderbilt Ned, MD;  Location: [facility];  Service: General;   Laterality: Right;   BREAST SURGERY  1997   Breast Reduction    CAPD INSERTION N/A 12/21/2023   Procedure: LAPAROSCOPIC INSERTION CONTINUOUS AMBULATORY PERITONEAL DIALYSIS  (CAPD) CATHETER; LAPAROSCOPIC OMENTUMPEXY;  Surgeon: Magda Ned SAILOR, MD;  Location: [facility];  Service: Vascular;  Laterality: N/A;   CYSTOSCOPY W/ URETERAL STENT REMOVAL  11/09/2012   Procedure: CYSTOSCOPY WITH STENT REMOVAL;  Surgeon: Ricardo Likens, MD;  Location: [facility];  Service: Urology;  Laterality: Right;   CYSTOSCOPY WITH RETROGRADE PYELOGRAM, URETEROSCOPY AND STENT PLACEMENT  11/09/2012   Procedure: CYSTOSCOPY WITH RETROGRADE PYELOGRAM, URETEROSCOPY AND STENT PLACEMENT;  Surgeon: Ricardo Likens, MD;  Location: [facility];  Service: Urology;  Laterality: Left;  LEFT URETEROSCOPY, STONE MANIPULATION, left STENT exchange    CYSTOSCOPY WITH STENT PLACEMENT  10/02/2012   Procedure: CYSTOSCOPY WITH STENT PLACEMENT;  Surgeon: Ricardo Likens, MD;  Location: [facility];  Service: Urology;  Laterality: Left;   DEBRIDEMENT AND CLOSURE WOUND N/A 01/20/2021   Procedure: Excision of abdominal wound with closure;  Surgeon: Lowery Estefana RAMAN, DO;  Location: [facility];  Service: Plastics;  Laterality: N/A;   DILATION AND CURETTAGE OF UTERUS  11/2002   s/p for 1st trimester nonviable pregnancy  EYE SURGERY     sty under eyelid   INCISE AND DRAIN ABCESS  08/2002   s/p I &D for righ inframmary fold hidradenitis   INCISION AND DRAINAGE PERITONSILLAR ABSCESS  12/2001   IR CV LINE INJECTION  06/07/2018   IR FLUORO GUIDE CV LINE RIGHT  01/30/2023   IR IMAGING GUIDED PORT INSERTION  06/20/2018   IR REMOVAL TUN ACCESS W/ PORT W/O FL MOD SED  06/20/2018   IR US  GUIDE VASC ACCESS RIGHT  01/30/2023   IRRIGATION AND DEBRIDEMENT ABSCESS  01/31/2012   Procedure: IRRIGATION AND DEBRIDEMENT ABSCESS;  Surgeon: Alm VEAR Angle, MD;  Location: [facility];  Service: General;  Laterality: Right;  right breast and axilla    LAPAROSCOPIC  REPOSITIONING CAPD CATHETER Left 01/10/2024   Procedure: Removal of peritoneal dialysis Catheter.;  Surgeon: Magda Debby SAILOR, MD;  Location[facility];  Service: Vascular;  Laterality: Left;   LAPAROSCOPY N/A 01/10/2024   Procedure: LAPAROSCOPY, DIAGNOSTIC;  Surgeon: Magda Debby SAILOR, MD;  Location: [facility] OR;  Service: Vascular;  Laterality: N/A;   NEPHROLITHOTOMY  10/02/2012   Procedure: NEPHROLITHOTOMY PERCUTANEOUS;  Surgeon: Ricardo Likens, MD;  [facility]: [facility];  Service: Urology;  Laterality: Right;  First Stage Percutaneous Nephrolithotomy with Surgeon Access, Left Ureteral Stent     NEPHROLITHOTOMY  10/04/2012   Procedure: NEPHROLITHOTOMY PERCUTANEOUS SECOND LOOK;  Surgeon: Ricardo Likens, MD;  [facility]: [facility];  Service: Urology;  Laterality: Right;      NEPHROLITHOTOMY  10/08/2012   Procedure: NEPHROLITHOTOMY PERCUTANEOUS;  Surgeon: Ricardo Likens, MD;  Location: [facility];  Service: Urology;  Laterality: Right;  THIRD STAGE, nephrostomy tube exchange x 2   NEPHROLITHOTOMY  10/11/2012   Procedure: NEPHROLITHOTOMY PERCUTANEOUS SECOND LOOK;  Surgeon: Ricardo Likens, MD;  Location: [facility];  Service: Urology;  Laterality: Right;  RIGHT 4 STAGE PERCUTANOUS NEPHROLITHOTOMY, right URETEROSCOPY WITH HOLMIUM LASER    PANNICULECTOMY N/A 12/21/2020   Procedure: PANNICULECTOMY;  Surgeon: Lowery Estefana RAMAN, DO;  Location: [facility];  Service: Plastics;  Laterality: N/A;   PERCUTANEOUS NEPHROSTOLITHOTOMY  04/2022   PORT-A-CATH REMOVAL N/A 07/16/2020   Procedure: REMOVAL PORT-A-CATH;  Surgeon: Vanderbilt Debby, MD;  Location: [facility];  Service: General;  Laterality: N/A;   PORTACATH PLACEMENT Left 05/17/2018   Procedure: INSERTION PORT-A-CATH;  Surgeon: Vernetta Berg, MD;  Location: [facility];  Service: General;  Laterality: Left;   RADICAL NECK DISSECTION  04/12/2017   limited/notes 04/12/2017   RADICAL NECK DISSECTION N/A 04/12/2017   Procedure: RADICAL NECK DISSECTION;   Surgeon: Carlie Clark, MD;  Location: [facility] OR;  Service: ENT;  Laterality: N/A;  limited neck dissection 2 hours total   REDUCTION MAMMAPLASTY Bilateral 1998   RIGHT/LEFT HEART CATH AND CORONARY ANGIOGRAPHY N/A 03/12/2020   Procedure: RIGHT/LEFT HEART CATH AND CORONARY ANGIOGRAPHY;  Surgeon: Cherrie Toribio SAUNDERS, MD;  [facility];  Service: Cardiovascular;  Laterality: N/A;   [place]  1994   TEE WITHOUT CARDIOVERSION N/A 08/25/2023   Procedure: TRANSESOPHAGEAL ECHOCARDIOGRAM;  Surgeon: Lucas Dorise POUR, MD;  Location: [facility];  Service: Open Heart Surgery;  Laterality: N/A;   TENCKHOFF CATHETER INSERTION Left 01/10/2024   Procedure: INSERTION, CATHETER, DIALYSIS, PERITONEAL;  Surgeon: Magda Debby SAILOR, MD;  Location: [facility];  Service: Vascular;  Laterality: Left;   THORACIC AORTIC ENDOVASCULAR STENT GRAFT N/A 02/02/2023   Procedure: THORACIC AORTIC ENDOVASCULAR STENT GRAFT;  Surgeon: Lanis Fonda BRAVO, MD;  Location: [facility] OR;  Service: Vascular;  Laterality: N/A;  THORACIC AORTIC ENDOVASCULAR STENT GRAFT N/A 08/25/2023   Procedure: THORACIC AORTIC ENDOVASCULAR STENT GRAFT;  Surgeon: Lanis Fonda BRAVO, MD;  Location: [facility] OR;  Service: Vascular;  Laterality: N/A;   THYROIDECTOMY  04/12/2017   completion/notes 04/12/2017   THYROIDECTOMY N/A 04/12/2017   Procedure: THYROIDECTOMY;  Surgeon: Carlie Clark, MD;  Location: [facility] OR;  Service: ENT;  Laterality: N/A;  Completion Thyroidectomy   TOTAL THYROIDECTOMY  2010   ULTRASOUND GUIDANCE FOR VASCULAR ACCESS Right 08/25/2023   Procedure: ULTRASOUND GUIDANCE FOR VASCULAR ACCESS, RIGHT FEMORAL ARTERY;  Surgeon: Lanis Fonda BRAVO, MD;  Location: [facility] OR;  Service: Vascular;  Laterality: Right;   Social History:  reports that she quit smoking about 6 years ago. Her smoking use included cigarettes. She started smoking about 7 years ago. She has a 7.5 pack-year smoking history. She has never used smokeless tobacco. She reports that she does not currently use  alcohol . She reports that she does not use drugs.  Allergies  Allergen Reactions   Genvoya [Elviteg-Cobic-Emtricit-Tenofaf] Hives   Lisinopril Cough   Aldactone  [Spironolactone ] Hives   Tegaderm Ag Mesh [Silver] Itching    Family History  Problem Relation Age of Onset   Hypertension Mother    Cancer Mother        laryngeal   Heart disease Mother        stent   Pancreatic cancer Father    Hypertension Father    Lung cancer Father 89       hx smoking   Hypertension Sister    Hypertension Sister    Hypertension Brother    Breast cancer Maternal Aunt 27   Breast cancer Maternal Aunt        dx 60+   Cancer Maternal Uncle        Lung CA   Breast cancer Paternal Aunt 61   Breast cancer Paternal Aunt        dx 85's   Breast cancer Paternal Aunt        dx 50's   Prostate cancer Paternal Uncle    Prostate cancer Paternal Uncle    Lung cancer Paternal Uncle    Breast cancer Cousin 56   Breast cancer Cousin        dx <50   Breast cancer Cousin        dx <50   Breast cancer Cousin        dx <50   Heart disease Other    Hypertension Other    Stroke Other        Grandparent   Kidney disease Other        Grandparent   Diabetes Other        FH of Diabetes   Colon cancer Neg Hx    Esophageal cancer Neg Hx    Rectal cancer Neg Hx    Stomach cancer Neg Hx     Prior to Admission medications   Medication Sig Start Date End Date Taking? Authorizing Provider  acetaminophen  (TYLENOL ) 500 MG tablet Take 1,000 mg by mouth every 6 (six) hours as needed for mild pain.   Yes [provider]  albuterol  (VENTOLIN  HFA) 108 (90 Base) MCG/ACT inhaler INHALE 2 PUFFS INTO THE LUNGS UP TO EVERY 6HRS AS NEEDED FOR WHEEZING/SHORTNESS OF BREATH 09/06/23  Yes Gretta Comer POUR, NP  allopurinol  (ZYLOPRIM ) 100 MG tablet TAKE 1 TABLET (100 MG TOTAL) BY MOUTH DAILY. FOR GOUT PREVENTION 04/11/24  Yes Clark, Katherine K, NP  amLODipine  (NORVASC ) 10 MG tablet  Take 10 mg by mouth daily. 05/29/23   Yes [provider]  aspirin  EC 81 MG tablet Take 81 mg by mouth daily.   Yes [provider]  AURYXIA  1 GM 210 MG(Fe) tablet Take 210 mg by mouth 3 (three) times daily with meals. 04/05/23  Yes [provider]  busPIRone  (BUSPAR ) 5 MG tablet Take 1 tablet (5 mg total) by mouth 2 (two) times daily. For anxiety 06/05/24  Yes Clark, Katherine K, NP  carvedilol  (COREG ) 12.5 MG tablet Take 12.5 mg by mouth 2 (two) times daily. 10/20/23  Yes [provider]  emtricitabine -tenofovir  AF (DESCOVY ) 200-25 MG tablet Take 1 tablet by mouth daily. 05/13/24  Yes Melvenia Corean SAILOR, NP  fluticasone  (FLOVENT  HFA) 110 MCG/ACT inhaler Inhale 1 puff into the lungs 2 (two) times daily.   Yes [provider]  HYDROcodone -acetaminophen  (NORCO/VICODIN) 5-325 MG tablet Take 2 tablets by mouth every 4 (four) hours as needed. 07/18/24  Yes Myriam Carrier C, PA  levocetirizine (XYZAL ) 5 MG tablet TAKE 1 TABLET BY MOUTH EVERY DAY IN THE EVENING FOR ALLERGIES 05/12/24  Yes Clark, Katherine K, NP  lidocaine  (LIDODERM ) 5 % PLACE 1 PATCH ONTO THE SKIN DAILY. REMOVE & DISCARD PATCH WITHIN 12 HOURS OR AS DIRECTED BY MD Patient taking differently: Place 1 patch onto the skin daily as needed (for pain). 04/21/24  Yes Clark, Katherine K, NP  losartan  (COZAAR ) 50 MG tablet TAKE 1 TABLET (50 MG TOTAL) BY MOUTH DAILY FOR BLOOD PRESSURE. 05/14/24  Yes Clark, Katherine K, NP  minocycline  (MINOCIN ) 100 MG capsule Take 1 capsule (100 mg total) by mouth 2 (two) times daily. 03/13/24  Yes Melvenia Corean SAILOR, NP  omeprazole  (PRILOSEC) 40 MG capsule Take 1 capsule (40 mg total) by mouth daily. for heartburn. 04/04/24  Yes Clark, Katherine K, NP  ondansetron  (ZOFRAN -ODT) 4 MG disintegrating tablet Take 1 tablet (4 mg total) by mouth every 8 (eight) hours as needed for nausea or vomiting. 06/28/24  Yes Clark, Katherine K, NP  rosuvastatin  (CRESTOR ) 10 MG tablet TAKE 1 TABLET BY MOUTH EVERY DAY FOR CHOLESTEROL  03/21/24  Yes Clark, Katherine K, NP  saxagliptin  HCl (ONGLYZA) 2.5 MG TABS tablet TAKE 1 TABLET (2.5 MG TOTAL) BY MOUTH DAILY. FOR DIABETES. 07/20/24  Yes Clark, Katherine K, NP  SYNTHROID  150 MCG tablet TAKE 1 TABLET BY MOUTH DAILY BEFORE BREAKFAST. 03/12/24  Yes Trixie File, MD  traMADol  (ULTRAM ) 50 MG tablet Take 1 tablet (50 mg total) by mouth every 12 (twelve) hours as needed. Patient taking differently: Take 50 mg by mouth every 12 (twelve) hours as needed for moderate pain (pain score 4-6). 03/20/24  Yes Yolande Lamar BROCKS, MD  Blood Glucose Monitoring Suppl (FREESTYLE LITE) w/Device KIT Inject 1 each into the skin daily. 11/10/20   Raford Riggs, MD  dolutegravir  ([name] ) 50 MG tablet Take 1 tablet (50 mg total) by mouth daily. Patient not taking: Reported on 06/27/2024 05/13/24   Melvenia Corean SAILOR, NP  Lancets [facility] ULTRASOFT) lancets Use as instructed to test blood sugar daily 02/15/24   Gretta Comer POUR, NP  predniSONE  (STERAPRED UNI-PAK 21 TAB) 10 MG (21) TBPK tablet Take by mouth daily. Take 6 tabs by mouth daily  for 2 days, then 5 tabs for 2 days, then 4 tabs for 2 days, then 3 tabs for 2 days, 2 tabs for 2 days, then 1 tab by mouth daily for 2 days Patient not taking: Reported on 07/30/2024 07/18/24   Myriam Carrier BROCKS,  PA    Physical Exam: Vitals:   07/31/24 0745 07/31/24 0830 07/31/24 0845 07/31/24 0915  BP: 123/61 135/73 123/62 121/78  Pulse: 79 84 80 91  Resp: 17 18 15    Temp:      TempSrc:      SpO2: 96% 99% 99% 96%  Weight:      Height:       Physical Exam Constitutional:      Appearance: She is normal weight.  HENT:     Head: Normocephalic and atraumatic.     Nose: Nose normal.     Mouth/Throat:     Mouth: Mucous membranes are moist.     Pharynx: Oropharynx is clear.  Eyes:     Pupils: Pupils are equal, round, and reactive to light.  Cardiovascular:     Rate and Rhythm: Normal rate and regular rhythm.  Pulmonary:     Effort: Pulmonary effort is  normal.  Abdominal:     General: Bowel sounds are normal. There is no distension.  Musculoskeletal:        General: Normal range of motion.  Skin:    General: Skin is warm.  Neurological:     General: No focal deficit present.     Comments: Kernig and brudzinski negative       Data Reviewed:  There are no new results to review at this time.  MR MRV HEAD WO CM EXAM: MRV BRAIN 07/31/2024 06:32:15 AM  TECHNIQUE: Multiplanar multisequence MRV of the head was performed without the administration of intravenous contrast. Multiplanar 2D and 3D reformatted images are provided for review.  COMPARISON: CT angiogram of the head dated 07/30/2024.  CLINICAL HISTORY: 58 year old female with new onset headache, nausea, blurry vision, photophobia, and difficulty walking due to pain. History includes aortic dissection, HIV, HTN, DM, prior DVT, kidney stones, and ESRD on peritoneal dialysis.  FINDINGS:  No focal stenosis or thrombus is seen of the major dural venous sinuses.  IMPRESSION: 1. Unremarkable MRV of the head.  Electronically signed by: Evalene Coho MD 07/31/2024 06:51 AM EDT RP Workstation: [id number]  Lab Results  Component Value Date   WBC 16.1 (H) 07/30/2024   HGB 11.9 (L) 07/30/2024   HCT 35.0 (L) 07/30/2024   MCV 110.0 (H) 07/30/2024   PLT 200 07/30/2024   Last metabolic panel Lab Results  Component Value Date   GLUCOSE 72 07/30/2024   NA 136 07/30/2024   K 4.5 07/30/2024   CL 97 (L) 07/30/2024   CO2 23 07/30/2024   BUN 78 (H) 07/30/2024   CREATININE 13.60 (H) 07/30/2024   GFRNONAA 3 (L) 07/30/2024   CALCIUM  8.3 (L) 07/30/2024   PHOS 4.8 (H) 09/01/2023   PROT 6.4 (L) 07/30/2024   ALBUMIN  2.6 (L) 07/30/2024   BILITOT 0.6 07/30/2024   ALKPHOS 157 (H) 07/30/2024   AST 62 (H) 07/30/2024   ALT 90 (H) 07/30/2024   ANIONGAP 20 (H) 07/30/2024    Assessment and Plan: * Migraine Persistent severe headache times roughly 1 week  New  onset Distribution frontal and occipital with ddx including sinus vs. Tensions type  Noted photophobia, nausea and generalized severe headache No focal signs or indication of meningitic symptoms at present Status post CTA of the neck and head as well as MRV that was grossly stable Some symptomatic improvement with Reglan  and Compazine  Given persistence of headache, will also reach out to neurology for any further recommendations Will otherwise continue to follow closely Follow-up neurology recommendations  History of aortic dissection  Baseline history of aortic dissection status post thoracic stent graft Had recent evaluation 07-18-24 for concern for acute aortic syndrome and back pain  Imaging showing moderate diffuse stenosis of the left external iliac artery from thrombosed dissection-reviewed by vascular surgery and felt to be stable .  Minimal back pain at present  Follow     HIV (human immunodeficiency virus infection) (HCC) Patient reports compliance with HAART regimen Continue home Descovy  Check CD4 count x 1  Type 2 diabetes mellitus with hyperlipidemia (HCC) Blood sugar in 100s SSI A1c  Hypothyroidism Continue Synthroid   Leukocytosis WBC 16 on presentation  Noted overlapping headache/migrainous symptoms on presentation  Grossly non meningeal symptoms at present- though pending formal neurology evaluation  Recent prednisone  use is a confounding issue WBC trend also appears to be 11-13 at baseline  Blood cultures x 1 Will otherwise monitor and trend for now    Chronic gout Cont allopurinol     CKD (chronic kidney disease) stage V requiring chronic dialysis (HCC) ESRD on peritoneal dialysis Will formally consult nephrology No acute abdominal tenderness concerning for peritonitis at present Monitor  Hyperlipidemia Cont crestor     Hidradenitis suppurativa On doxycycline  chronically    Asthma Appears stable from a respiratory standpoint at  present Continue home regimen including albuterol   Depression Mood appears stable  No reported active HI/SI  Cont home buspar  Monitor      Advance Care Planning:   Code Status: Prior   Consults: Neurology   Family Communication: No family at the bedside   Severity of Illness: The appropriate patient status for this patient is OBSERVATION. Observation status is judged to be reasonable and necessary in order to provide the required intensity of service to ensure the patient's safety. The patient's presenting symptoms, physical exam findings, and initial radiographic and laboratory data in the context of their medical condition is felt to place them at decreased risk for further clinical deterioration. Furthermore, it is anticipated that the patient will be medically stable for discharge from the hospital within 2 midnights of admission.   Author: Elspeth JINNY Masters, MD 07/31/2024 9:59 AM  For on call review [email or web address].

## 2024-07-31 NOTE — ED Notes (Signed)
 IV team at bedside

## 2024-07-31 NOTE — Assessment & Plan Note (Signed)
 ESRD on peritoneal dialysis Will formally consult nephrology No acute abdominal tenderness concerning for peritonitis at present Monitor

## 2024-07-31 NOTE — Assessment & Plan Note (Signed)
 Cont crestor

## 2024-07-31 NOTE — Assessment & Plan Note (Addendum)
 Baseline history of aortic dissection status post thoracic stent graft Had recent evaluation 07-18-24 for concern for acute aortic syndrome and back pain  Imaging showing moderate diffuse stenosis of the left external iliac artery from thrombosed dissection-reviewed by vascular surgery and felt to be stable .  Minimal back pain at present  Follow

## 2024-08-01 ENCOUNTER — Observation Stay (HOSPITAL_COMMUNITY)

## 2024-08-01 DIAGNOSIS — D497 Neoplasm of unspecified behavior of endocrine glands and other parts of nervous system: Secondary | ICD-10-CM | POA: Diagnosis not present

## 2024-08-01 DIAGNOSIS — E236 Other disorders of pituitary gland: Secondary | ICD-10-CM | POA: Diagnosis not present

## 2024-08-01 DIAGNOSIS — B20 Human immunodeficiency virus [HIV] disease: Secondary | ICD-10-CM

## 2024-08-01 LAB — COMPREHENSIVE METABOLIC PANEL WITH GFR
ALT: 70 U/L — ABNORMAL HIGH (ref 0–44)
AST: 58 U/L — ABNORMAL HIGH (ref 15–41)
Albumin: 2.2 g/dL — ABNORMAL LOW (ref 3.5–5.0)
Alkaline Phosphatase: 153 U/L — ABNORMAL HIGH (ref 38–126)
Anion gap: 20 — ABNORMAL HIGH (ref 5–15)
BUN: 81 mg/dL — ABNORMAL HIGH (ref 6–20)
CO2: 20 mmol/L — ABNORMAL LOW (ref 22–32)
Calcium: 8.2 mg/dL — ABNORMAL LOW (ref 8.9–10.3)
Chloride: 95 mmol/L — ABNORMAL LOW (ref 98–111)
Creatinine, Ser: 14.1 mg/dL — ABNORMAL HIGH (ref 0.44–1.00)
GFR, Estimated: 3 mL/min — ABNORMAL LOW (ref >60)
Glucose, Bld: 106 mg/dL — ABNORMAL HIGH (ref 70–99)
Potassium: 3.7 mmol/L (ref 3.5–5.1)
Sodium: 135 mmol/L (ref 135–145)
Total Bilirubin: 0.7 mg/dL (ref 0.0–1.2)
Total Protein: 5.5 g/dL — ABNORMAL LOW (ref 6.5–8.1)

## 2024-08-01 LAB — GLUCOSE, CAPILLARY
Glucose-Capillary: 116 mg/dL — ABNORMAL HIGH (ref 70–99)
Glucose-Capillary: 185 mg/dL — ABNORMAL HIGH (ref 70–99)
Glucose-Capillary: 118 mg/dL — ABNORMAL HIGH (ref 70–99)
Glucose-Capillary: 121 mg/dL — ABNORMAL HIGH (ref 70–99)
Glucose-Capillary: 75 mg/dL (ref 70–99)

## 2024-08-01 LAB — CBC
HCT: 29.7 % — ABNORMAL LOW (ref 36.0–46.0)
HCT: 30.3 % — ABNORMAL LOW (ref 36.0–46.0)
Hemoglobin: 10 g/dL — ABNORMAL LOW (ref 12.0–15.0)
Hemoglobin: 10.2 g/dL — ABNORMAL LOW (ref 12.0–15.0)
MCH: 36.6 pg — ABNORMAL HIGH (ref 26.0–34.0)
MCH: 37.1 pg — ABNORMAL HIGH (ref 26.0–34.0)
MCHC: 33.7 g/dL (ref 30.0–36.0)
MCHC: 33.7 g/dL (ref 30.0–36.0)
MCV: 108.8 fL — ABNORMAL HIGH (ref 80.0–100.0)
MCV: 110.2 fL — ABNORMAL HIGH (ref 80.0–100.0)
Platelets: 164 K/uL (ref 150–400)
Platelets: 168 K/uL (ref 150–400)
RBC: 2.73 MIL/uL — ABNORMAL LOW (ref 3.87–5.11)
RBC: 2.75 MIL/uL — ABNORMAL LOW (ref 3.87–5.11)
RDW: 14.8 % (ref 11.5–15.5)
RDW: 15.1 % (ref 11.5–15.5)
WBC: 13.2 K/uL — ABNORMAL HIGH (ref 4.0–10.5)
WBC: 14.7 K/uL — ABNORMAL HIGH (ref 4.0–10.5)
nRBC: 0.1 % (ref 0.0–0.2)
nRBC: 0.2 % (ref 0.0–0.2)

## 2024-08-01 LAB — T4, FREE: Free T4: 1 ng/dL (ref 0.61–1.12)

## 2024-08-01 LAB — T-HELPER CELLS (CD4) COUNT (NOT AT ARMC)
CD4 % Helper T Cell: 38 % (ref 33–65)
CD4 T Cell Abs: 445 /uL (ref 400–1790)

## 2024-08-01 LAB — TSH: TSH: 0.231 u[IU]/mL — ABNORMAL LOW (ref 0.350–4.500)

## 2024-08-01 LAB — CORTISOL: Cortisol, Plasma: 7.5 ug/dL

## 2024-08-01 MED ORDER — LIDOCAINE 5 % EX PTCH: 1.0000 | MEDICATED_PATCH | Freq: Every day | CUTANEOUS | Status: DC | PRN

## 2024-08-01 MED ORDER — METHOCARBAMOL 500 MG PO TABS
500.0000 mg | ORAL_TABLET | Freq: Four times a day (QID) | ORAL | Status: DC | PRN
  Administered 2024-08-03 – 2024-08-04 (×4): 500 mg via ORAL
  Filled 2024-08-01 (×5): qty 1

## 2024-08-01 MED ORDER — DIAZEPAM 5 MG PO TABS
5.0000 mg | ORAL_TABLET | Freq: Once | ORAL | Status: AC | PRN
  Administered 2024-08-01: 5 mg via ORAL
  Filled 2024-08-01: qty 1

## 2024-08-01 MED ORDER — DOLUTEGRAVIR SODIUM 50 MG PO TABS
50.0000 mg | ORAL_TABLET | Freq: Every day | ORAL | Status: DC
Start: 2024-08-01 — End: 2024-08-04
  Administered 2024-08-01 – 2024-08-04 (×4): 50 mg via ORAL
  Filled 2024-08-01 (×4): qty 1

## 2024-08-01 MED ORDER — AMITRIPTYLINE HCL 25 MG PO TABS
12.5000 mg | ORAL_TABLET | Freq: Every day | ORAL | Status: DC
  Administered 2024-08-02 – 2024-08-03 (×2): 12.5 mg via ORAL
  Filled 2024-08-01 (×2): qty 1

## 2024-08-01 MED ORDER — HYDROCORTISONE SOD SUC (PF) 100 MG IJ SOLR
100.0000 mg | INTRAMUSCULAR | Status: DC
  Filled 2024-08-01: qty 2

## 2024-08-01 MED ORDER — BUSPIRONE HCL 10 MG PO TABS
5.0000 mg | ORAL_TABLET | Freq: Two times a day (BID) | ORAL | Status: DC
  Administered 2024-08-01 – 2024-08-04 (×6): 5 mg via ORAL
  Filled 2024-08-01 (×6): qty 1

## 2024-08-01 MED ORDER — KETOROLAC TROMETHAMINE 15 MG/ML IJ SOLN
15.0000 mg | INTRAMUSCULAR | Status: AC
  Administered 2024-08-01: 15 mg via INTRAVENOUS
  Filled 2024-08-01: qty 1

## 2024-08-01 MED ORDER — DELFLEX-LC/1.5% DEXTROSE 344 MOSM/L IP SOLN: INTRAPERITONEAL | Status: DC

## 2024-08-01 MED ORDER — MIDODRINE HCL 5 MG PO TABS
10.0000 mg | ORAL_TABLET | ORAL | Status: AC
  Administered 2024-08-01: 10 mg via ORAL
  Filled 2024-08-01: qty 2

## 2024-08-01 MED ORDER — ACETAMINOPHEN 500 MG PO TABS
1000.0000 mg | ORAL_TABLET | Freq: Four times a day (QID) | ORAL | Status: DC | PRN
Start: 2024-08-01 — End: 2024-08-04
  Administered 2024-08-03 – 2024-08-04 (×4): 1000 mg via ORAL
  Filled 2024-08-01 (×4): qty 2

## 2024-08-01 MED ORDER — GABAPENTIN 300 MG PO CAPS
300.0000 mg | ORAL_CAPSULE | Freq: Three times a day (TID) | ORAL | Status: AC
Start: 2024-08-01 — End: ?
  Administered 2024-08-01 – 2024-08-02 (×3): 300 mg via ORAL
  Filled 2024-08-01 (×3): qty 1

## 2024-08-01 MED ORDER — GENTAMICIN SULFATE 0.1 % EX CREA
1.0000 | TOPICAL_CREAM | Freq: Every day | CUTANEOUS | Status: DC
  Administered 2024-08-02 – 2024-08-03 (×2): 1 via TOPICAL
  Filled 2024-08-01: qty 15

## 2024-08-01 MED ORDER — ALBUMIN HUMAN 25 % IV SOLN
25.0000 g | INTRAVENOUS | Status: DC
  Filled 2024-08-01: qty 100

## 2024-08-01 MED ORDER — DEXTROSE 50 % IV SOLN
50.0000 mL | INTRAVENOUS | Status: DC | PRN
  Administered 2024-08-01: 50 mL via INTRAVENOUS
  Filled 2024-08-01: qty 50

## 2024-08-01 MED ORDER — GENTAMICIN SULFATE 0.1 % EX CREA
1.0000 | TOPICAL_CREAM | Freq: Every day | CUTANEOUS | Status: DC
  Filled 2024-08-01: qty 15

## 2024-08-01 MED ORDER — SODIUM CHLORIDE 0.9 % IV BOLUS: 250.0000 mL | INTRAVENOUS | Status: DC

## 2024-08-01 NOTE — Evaluation (Signed)
 Physical Therapy Evaluation Patient Details Name: [name] MRN: [id number] DOB: [birth year] Today's Date: 08/01/2024  History of Present Illness  58 y.o. female presents to [facility] 07/30/24 with a headache, blurry vision, nausea and trouble walking. 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 PMHx: HIV on therapy, HTN, DM, Grave's disease, prior DVT, aortic dissection status post-repair, ESRD on peritoneal dialysis   Clinical Impression  PTA pt was independent for mobility with no AD. Pt presents with B LE weakness, burning sensation in B LE, impaired balance, and decreased activity tolerance. Pt was able to stand and ambulate 85ft with RW and CGA for safety. Pt limited gait distance due to h/a that was worse with bright hallway lights. Pt has 24/7 assist as needed and can stay on the couch on the ground floor if needed. Will continue to follow to ensure safety with return home and to work on addressing impairments listed below. Recommending initial HHPT with progression to OP PT.        If plan is discharge home, recommend the following: A lot of help with walking and/or transfers;Assist for transportation;Help with stairs or ramp for entrance   Can travel by private vehicle    Yes    Equipment Recommendations Rolling walker (2 wheels)     Functional Status Assessment Patient has had a recent decline in their functional status and demonstrates the ability to make significant improvements in function in a reasonable and predictable amount of time.     Precautions / Restrictions Precautions Precautions: Fall Recall of Precautions/Restrictions: Intact Restrictions Weight Bearing Restrictions Per Provider Order: No      Mobility  Bed Mobility Overal bed mobility: Modified Independent   Transfers Overall transfer level: Needs assistance Equipment used: Rolling walker (2 wheels) Transfers: Sit to/from Stand Sit to Stand: Contact guard assist    General transfer comment: CGA for safety, two attempts as pt sat preemptively due to an increase in h/a with initial rise.    Ambulation/Gait Ambulation/Gait assistance: Contact guard assist Gait Distance (Feet): 40 Feet Assistive device: Rolling walker (2 wheels) Gait Pattern/deviations: Step-through pattern, Decreased stride length Gait velocity: decr    General Gait Details: steady gait with heavy reliance on UE support, pt limited gait distance due to h/a    Balance Overall balance assessment: Needs assistance Sitting-balance support: No upper extremity supported, Feet supported Sitting balance-Leahy Scale: Good     Standing balance support: Bilateral upper extremity supported, During functional activity, Reliant on assistive device for balance Standing balance-Leahy Scale: Poor Standing balance comment: reliant on UE support         Pertinent Vitals/Pain Pain Assessment Pain Assessment: Faces Pain Location: headache and BLE pain Pain Descriptors / Indicators: Headache, Burning, Pins and needles Pain Intervention(s): Repositioned, Monitored during session, Limited activity within patient's tolerance    Home Living Family/patient expects to be discharged to:: Private residence Living Arrangements: Alone Available Help at Discharge: Family;Available 24 hours/day Type of Home: Other(Comment) (townhome) Home Access: Stairs to enter Entrance Stairs-Rails: None Entrance Stairs-Number of Steps: 2 Alternate Level Stairs-Number of Steps: 10 Home Layout: Two level;1/2 bath on main level;Bed/bath upstairs;Able to live on main level with bedroom/bathroom Home Equipment: None Additional Comments: for the past month family has been bringing her food. bc she says that she has lost her appetite and she is not eating, so she has gone from 220lbs to 160lbs (eating once a day)    Prior Function Prior Level of Function : Independent/Modified Independent;Driving (on  disability since  April 2024)    Mobility Comments: community amb with no AD, drove self to dialysis ADLs Comments: independent in ADL, but family/friends providing food for about a month     Extremity/Trunk Assessment   Upper Extremity Assessment Upper Extremity Assessment: Defer to OT evaluation    Lower Extremity Assessment Lower Extremity Assessment: RLE deficits/detail;LLE deficits/detail RLE Deficits / Details: Hip flexion 4/5, Knee ext 4+/5, Ankle DF 4+/5 RLE Sensation: WNL (alert to light touch, reports burning pain from knees down into feet) LLE Deficits / Details: Hip flexion 4/5, Knee ext 4+/5, Ankle DF 4+/5 LLE Sensation: WNL (alert to light touch, reports burning pain from knees down into feet)    Cervical / Trunk Assessment Cervical / Trunk Assessment: Normal  Communication   Communication Communication: No apparent difficulties    Cognition Arousal: Alert Behavior During Therapy: WFL for tasks assessed/performed   PT - Cognitive impairments: No apparent impairments    Following commands: Intact       Cueing Cueing Techniques: Verbal cues, Tactile cues      PT Assessment Patient needs continued PT services  PT Problem List Decreased strength;Decreased activity tolerance;Decreased balance;Decreased mobility       PT Treatment Interventions DME instruction;Gait training;Stair training;Functional mobility training;Therapeutic activities;Therapeutic exercise;Balance training;Neuromuscular re-education;Patient/family education    PT Goals (Current goals can be found in the Care Plan section)  Acute Rehab PT Goals Patient Stated Goal: to feel better PT Goal Formulation: With patient Time For Goal Achievement: 08/15/24 Potential to Achieve Goals: Good    Frequency Min 2X/week        AM-PAC PT 6 Clicks Mobility  Outcome Measure Help needed turning from your back to your side while in a flat bed without using bedrails?: None Help needed moving from lying on your  back to sitting on the side of a flat bed without using bedrails?: None Help needed moving to and from a bed to a chair (including a wheelchair)?: A Little Help needed standing up from a chair using your arms (e.g., wheelchair or bedside chair)?: A Little Help needed to walk in hospital room?: A Little Help needed climbing 3-5 steps with a railing? : A Lot 6 Click Score: 19    End of Session Equipment Utilized During Treatment: Gait belt Activity Tolerance: Patient tolerated treatment well Patient left: in bed;with call bell/phone within reach;with bed alarm set;with family/visitor present Nurse Communication: Mobility status PT Visit Diagnosis: Unsteadiness on feet (R26.81);Other abnormalities of gait and mobility (R26.89);Muscle weakness (generalized) (M62.81)    Time: 1040-1057 PT Time Calculation (min) (ACUTE ONLY): 17 min   Charges:   PT Evaluation $PT Eval Low Complexity: 1 Low   PT General Charges $$ ACUTE PT VISIT: 1 Visit       [name] ORN, PT, DPT Secure Chat Preferred  Rehab Office [phone]   [name] BRAVO Wendolyn 08/01/2024, 11:07 AM

## 2024-08-01 NOTE — Care Management Obs Status (Signed)
 MEDICARE OBSERVATION STATUS NOTIFICATION   Patient Details  Name: [name] MRN: [id number] Date of Birth: Feb 18, 1966   Medicare Observation Status Notification Given:    Obs notice sighed and copy given    Claretta Deed 08/01/2024, 2:57 PM

## 2024-08-01 NOTE — Progress Notes (Signed)
 PROGRESS NOTE  [name]  DOB: Aug 06, 1966  PCP: Gretta Comer POUR, NP FMW:[id number]  DOA: 07/30/2024  LOS: 0 days  Hospital Day: 3  Subjective: Patient was seen and examined this morning. Pleasant middle-aged African-American female.  Sitting up in recliner.  Headache better after pain medicines this morning.  Complains of intermittent shooting pain down her legs.  Daughter [name] at bedside.. Afebrile, hemodynamically stable  Brief narrative: [name] is a 58 y.o. female with PMH significant for HIV on therapy, DM2, HTN, ESRD on PD, h/o aortic dissection s/p stent, sarcoidosis, hidradenitis, Grave's disease s/p thyroidectomy on Synthroid , prior DVT, chronic back pain 10/14, patient presented to the ED with complaint of severe headache. Patient states he went to bed in her usual state of health on 10/12, woke up next morning 10/13 feeling pain in the frontal part of her head and face.  Had associated nausea, vomited once, also complained of some blurriness of vision, trouble walking, photophobia..  She reports recently completing a course of prednisone  on 10/12. Headache worsened throughout the day, woke up in the middle of the night around 10/13 with severe headache and presented to the ED next day.  CT angio head and neck did not show any large vessel occlusion, hemodynamically significant stenosis or any other emergent finding. 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 Recommend dedicated MRI of the pituitary/sella with and without contrast for further evaluation. MRV head was unremarkable   Neurology was consulted. Admitted to [facility].  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  Recommended dedicated MRI of the pituitary. I  have requested neurosurgery consultation to Dr. Janjua.  To suggest if any further imaging is required.  Per nephrology, the risk of NSF is very low with group 2 contrast agents that are currently used. For headache, patient has received IV Toradol  Neurology also recommended Amitriptyline 12.5mg  nightly to start for HA management x 1 week, then increase to 25mg .  HIV Reports compliance to HAART regimen Continue home Descovy  and [name] .  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  Minimal back pain at present   Leukocytosis No current evidence of infection  Blood culture sent.  Currently not on antibiotics.   Continue to monitor Recent Labs  Lab 07/30/24 2001 08/01/24 0033 08/01/24 0229  WBC 16.1* 13.2* 14.7*   Type 2 diabetes mellitus A1c 6.1 on 07/31/2024 PTA meds-saxagliptin  2.5 mg daily.  Currently on hold Continue SSI/Accu-Cheks Recent Labs  Lab 07/31/24 1246 07/31/24 1759 07/31/24 2120 08/01/24 0621  GLUCAP 88 70 180* 116*   Hypertension PTA meds- Coreg  12.5 mg twice daily, amlodipine  10 mg daily, losartan  50 mg daily, Currently heart rate and blood pressure is stable without these meds.  HLD PTA meds- aspirin , Crestor  Continue Crestor .  I would keep aspirin  on hold for the potential need of brain biopsy  Chronic macrocytic anemia Continue iron  supplement Recent Labs    07/18/24 1641 07/30/24 2001 07/30/24 2019 08/01/24 0033 08/01/24 0229  HGB 10.9* 10.8* 11.9* 10.0* 10.2*  MCV  --  110.0*  --  108.8* 110.2*   Hypothyroidism H/o thyroidectomy for Graves' disease Continue Synthroid    Chronic gout Cont allopurinol    H/o sarcoidosis   Hidradenitis  suppurativa On doxycycline  chronically  Minocycline   Prior DVT Not on chronic anticoagulation   Chronic back pain PTA meds- Tylenol  PRN, Norco PRN, Lidoderm  patch PRN, Robaxin  as needed.  Resume all.   Depression Continue BuSpar     Mobility: Encourage ambulation PT Orders: Active   PT Follow up Rec: Home Health Pt10/16/2025 1055   Goals of care   Code Status: Full Code     DVT prophylaxis:  heparin  injection 5,000 Units Start: 07/31/24 2245   Antimicrobials: HAART Fluid: None Consultants: Nephrology, neurology, neurosurgery Family Communication: Daughter at bedside  Status: Observation Level of care:  Telemetry Medical   Patient is from: Home Needs to continue in-hospital care: Ongoing workup Anticipated d/c to: Pending clinical course, eventually home likely      Diet:  Diet Order             Diet renal with fluid restriction Fluid restriction: 1200 mL Fluid; Room service appropriate? Yes; Fluid consistency: Thin  Diet effective now                   Scheduled Meds:  allopurinol   100 mg Oral Daily   amitriptyline  12.5 mg Oral QHS   budesonide  (PULMICORT ) nebulizer solution  0.25 mg Nebulization BID   busPIRone   5 mg Oral BID   dolutegravir   50 mg Oral Daily   emtricitabine -tenofovir  AF  1 tablet Oral Daily   ferric citrate   210 mg Oral TID WC   gabapentin   300 mg Oral TID   gentamicin  cream  1 Application Topical Daily   heparin   5,000 Units Subcutaneous Q8H   insulin  aspart  0-6 Units Subcutaneous TID WC   levothyroxine   150 mcg Oral QAC breakfast   minocycline   100 mg Oral BID   pantoprazole   40 mg Oral Daily   rosuvastatin   10 mg Oral Daily    PRN meds: acetaminophen , albuterol , heparin  sodium (porcine) 3,000 Units in dialysis solution 1.5% low-MG/low-CA 6,000 mL dialysis solution, lidocaine , methocarbamol , metoCLOPramide  (REGLAN ) injection, prochlorperazine    Infusions:   dialysis solution 1.5% low-MG/low-CA       Antimicrobials: Anti-infectives (From admission, onward)    Start     Dose/Rate Route Frequency Ordered Stop   08/01/24 1200  dolutegravir  ([name] ) tablet 50 mg        50 mg Oral Daily 08/01/24 1108     07/31/24 1000  dolutegravir  ([name] ) tablet 50 mg  Status:  Discontinued        50 mg Oral Daily 07/31/24 0921 07/31/24 0923   07/31/24 1000  emtricitabine -tenofovir  AF (DESCOVY ) 200-25 MG per tablet 1 tablet        1 tablet Oral Daily 07/31/24 0921     07/31/24 1000  minocycline  (MINOCIN ) capsule 100 mg        100 mg Oral 2 times daily 07/31/24 0921         Objective: Vitals:   08/01/24 0803 08/01/24 0852  BP:  (!) 103/47  Pulse: 88 89  Resp: 17 18  Temp:  98.1 F (36.7 C)  SpO2: 99% 99%   No intake or output data in the 24 hours ending 08/01/24 1126 Filed Weights   07/30/24 1800 07/31/24 1748 08/01/24 0500  Weight: 79 kg 82.3 kg 86 kg   Weight change: 3.3 kg Body mass index is 31.55  kg/m.   Physical Exam: General exam: Pleasant, middle-aged African-American female.  Not in distress currently Skin: No rashes, lesions or ulcers. HEENT: Atraumatic, normocephalic, no obvious bleeding Lungs: Clear to auscultation bilaterally,  CVS: S1, S2, no murmur,   GI/Abd: Soft, nontender, nondistended, bowel sound present,   CNS: Alert, awake, oriented x 3 Psychiatry: Mood appropriate Extremities: No pedal edema, no calf tenderness,   Data Review: I have personally reviewed the laboratory data and studies available.  F/u labs ordered Unresulted Labs (From admission, onward)    None       Signed, Chapman Rota, MD [facility] 08/01/2024

## 2024-08-01 NOTE — Progress Notes (Addendum)
   08/01/24 1858  Cycler Setup  Total Number of Night Cycles 6  Night Fill Volume 2250  Dianeal Solution Dextrose  1.5% in 6000 mL Low Cal/Low Mag  Night Dwell Time per Cycle - Hour(s) 1  Night Dwell Time per Cycle - Minute(s) 30  Night Time Therapy - Minute(s) 14  Night Time Therapy - Hour(s) 12  Minimum Initial Drain Volume 0  Maximum Peritoneal Volume 3375  Night/Total Therapy Volume 86499  Day Exchange No  Completion  Treatment Status Started  Education / Care Plan  Dialysis Education Provided Yes  Hand-off documentation  Hand-off Given Given to shift RN/LPN  Report given to (Full Name) Janetta Ladarien Beeks  Hand-off Received Received from shift RN/LPN  Report received from (Full Name) Tampra   Peritoneal dressing changed,  Schedule time off 0700 Hrs.  Pt. Taken off CCPD for MRI at Approximately 2100 hrs; treatment resume at 0200 hrs. End time reschedule to 0800 hrs.

## 2024-08-01 NOTE — Progress Notes (Signed)
 [facility] Progress Note   Subjective:    Seen and examined patient at bedside. She reports tolerating PD overnight. She also reports headache and blurred vision are slowly improving. Discussed with Hospitalist. 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 Plan for PD tonight and will raise exchanges to 6 for the next 2 nights to enhance clearance after she receives the contrast. Neurology and Neurosurgery have been consulted.  Objective Vitals:   08/01/24 0500 08/01/24 0803 08/01/24 0852 08/01/24 1320  BP:   (!) 103/47 (!) 77/37  Pulse:  88 89 81  Resp:  17 18 18   Temp:   98.1 F (36.7 C) 98.8 F (37.1 C)  TempSrc:    Oral  SpO2:  99% 99% 98%  Weight: 86 kg     Height:       Physical Exam General: Awake, alert, on RA, NAD Head: Sclera not icteric  Lungs: CTA bilaterally. No wheeze, rales or rhonchi. Breathing is unlabored. Heart: RRR. No murmur, rubs or gallops.  Abdomen: soft and non-tender  Neuro: AAOx3. Moves all extremities spontaneously. Dialysis Access: PD cather LUQ  Filed Weights   07/30/24 1800 07/31/24 1748 08/01/24 0500  Weight: 79 kg 82.3 kg 86 kg   No intake or output data in the 24 hours ending 08/01/24 1448  Additional Objective Labs: Basic Metabolic Panel: Recent Labs  Lab 07/30/24 2011 07/30/24 2019 08/01/24 0229  NA 136 136 135  K 4.8 4.5 3.7  CL 93* 97* 95*  CO2 23  --  20*  GLUCOSE 73 72 106*  BUN 79* 78* 81*  CREATININE 13.88* 13.60* 14.10*  CALCIUM  8.3*  --  8.2*   Liver Function Tests: Recent Labs  Lab 07/30/24 2011 08/01/24 0229  AST 62* 58*  ALT 90* 70*  ALKPHOS 157* 153*  BILITOT 0.6 0.7  PROT 6.4* 5.5*  ALBUMIN  2.6* 2.2*   No results for input(s): LIPASE, AMYLASE in the last 168 hours. CBC: Recent Labs  Lab 07/30/24 2001 07/30/24 2019 08/01/24 0033 08/01/24 0229  WBC 16.1*  --  13.2* 14.7*  NEUTROABS 13.0*  --   --   --   HGB  10.8* 11.9* 10.0* 10.2*  HCT 31.9* 35.0* 29.7* 30.3*  MCV 110.0*  --  108.8* 110.2*  PLT 200  --  164 168   Blood Culture    Component Value Date/Time   SDES BLOOD RIGHT HAND 07/31/2024 1929   SPECREQUEST  07/31/2024 1929    BOTTLES DRAWN AEROBIC AND ANAEROBIC Blood Culture adequate volume   CULT  07/31/2024 1929    NO GROWTH < 12 HOURS Performed at [facility], [address], [place], [place] [address]    REPTSTATUS PENDING 07/31/2024 1929    Cardiac Enzymes: No results for input(s): CKTOTAL, CKMB, CKMBINDEX, TROPONINI in the last 168 hours. CBG: Recent Labs  Lab 07/31/24 1246 07/31/24 1759 07/31/24 2120 08/01/24 0621 08/01/24 1218  GLUCAP 88 70 180* 116* 185*   Iron  Studies: No results for input(s): IRON , TIBC, TRANSFERRIN, FERRITIN in the last 72 hours. Lab Results  Component Value Date   INR 1.4 (H) 08/29/2023   INR 1.4 (H) 08/28/2023   INR 1.3 (H) 08/28/2023   Studies/Results: MR BRAIN WO CONTRAST Result Date: 07/31/2024 EXAM: MRI BRAIN WITHOUT CONTRAST 07/31/2024 03:13:51 PM TECHNIQUE: Multiplanar multisequence MRI of the head/brain was performed without the administration of intravenous contrast. COMPARISON: Same day MRV head and CTA  head 07/30/2024. CLINICAL HISTORY: Headache, new onset (Age >= 51y). Headache x5 days with neck stiffness. FINDINGS: BRAIN AND VENTRICLES: No acute infarct. No acute intracranial hemorrhage. Chronic microhemorrhages are present in the left frontal lobe and right parietal lobe. Scattered T2 FLAIR hyperintensity in the periventricular and subcortical white matter suggestive of mild chronic microvascular ischemic changes. Additional subtle subglamorality in the pons likely related to the same. 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 Recommend correlation with dedicated MRI of the pituitary/sella with and without contrast for further  evaluation. No midline shift. No hydrocephalus. Normal flow voids. ORBITS: No acute abnormality. SINUSES AND MASTOIDS: No acute abnormality. BONES AND SOFT TISSUES: Normal marrow signal. No acute soft tissue abnormality. IMPRESSION: 1. 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 Recommend dedicated MRI of the pituitary/sella with and without contrast for further evaluation. 2. Mild chronic microvascular ischemic changes. 3. No acute findings. Electronically signed by: Donnice Mania MD 07/31/2024 03:24 PM EDT RP Workstation: [id number]   MR MRV HEAD WO CM Result Date: 07/31/2024 EXAM: MRV BRAIN 07/31/2024 06:32:15 AM TECHNIQUE: Multiplanar multisequence MRV of the head was performed without the administration of intravenous contrast. Multiplanar 2D and 3D reformatted images are provided for review. COMPARISON: CT angiogram of the head dated 07/30/2024. CLINICAL HISTORY: 58 year old female with new onset headache, nausea, blurry vision, photophobia, and difficulty walking due to pain. History includes aortic dissection, HIV, HTN, DM, prior DVT, kidney stones, and ESRD on peritoneal dialysis. FINDINGS: No focal stenosis or thrombus is seen of the major dural venous sinuses. IMPRESSION: 1. Unremarkable MRV of the head. Electronically signed by: Evalene Coho MD 07/31/2024 06:51 AM EDT RP Workstation: [id number]   CT Angio Head W or Wo Contrast Result Date: 07/30/2024 EXAM: CTA Head Without and with Intravenous Contrast CLINICAL HISTORY: Headache, sudden, severe. Migraine; CT Angio Head W or Wo Contrast; Headache, sudden, severe. Omni 350 75mL; See ED Notes:; Pt BIB [facility] from home. Pt began having headache, light sensitivity, nausea, and vomiting beginning at 2am last night. Pt states it has continued throughout the day. Pt says she does not have hx of migraines. TECHNIQUE: Axial CTA images of the head without and with intravenous contrast. MIP reconstructed images were  created and reviewed. Dose reduction technique was used including one or more of the following: automated exposure control, adjustment of mA and kV according to patient size, and/or iterative reconstruction. CONTRAST: Without and with intravenous contrast; 75 mL iohexol  (OMNIPAQUE ) 350 MG/ML injection. COMPARISON: None provided. FINDINGS: BRAIN PARENCHYMA: Cerebral volume within normal limits. Patchy hypodensity involving the supratentorial cerebral white matter, most characteristic of chronic microvascular ischemic disease. No acute intracranial hemorrhage. No acute large vessel territory infarct. No mass lesion or midline shift. No hydrocephalus or axial shift or fluid collection. No abnormal hyperdense vessel. Calcified atherosclerosis present about the skull base. INTERNAL CAROTID ARTERIES: Mild atherosclerotic change about the carotid siphons without hemodynamically significant stenosis. A1 segments, anterior communicative complex, and anterior cerebral arteries are widely patent. No 1.1 cm occlusion. Dyscalcified branches perfusion asymmetric. No intracranial aneurysm. ANTERIOR CEREBRAL ARTERIES: Widely patent. No significant stenosis. No occlusion. No aneurysm. MIDDLE CEREBRAL ARTERIES: No significant stenosis. No occlusion. No aneurysm. POSTERIOR CEREBRAL ARTERIES: Patent bilaterally. No significant stenosis. No occlusion. No aneurysm. BASILAR ARTERY: Patent without stenosis. No occlusion. No aneurysm. VERTEBRAL ARTERIES: Both V4 segments patent without significant stenosis. Left vertebral artery dominant. Right PICA patent at its  origin. Left PICA origin not well seen. No intracranial aneurysm. SOFT TISSUES: Scalp soft tissues demonstrate no acute finding. No masses or lymphadenopathy. Paranasal sinuses and mastoid air cells are largely clear. BONES: Calvarium intact. No acute osseous abnormality. IMPRESSION: 1. Negative CTA for acute large vessel occlusion or other emergent findings. No hemodynamically  significant or correctable stenosis. No intracranial aneurysm. 2. No no other acute intracranial abnormality. 3. Chronic microvascular ischemic disease. . Electronically signed by: Morene Hoard MD 07/30/2024 10:18 PM EDT RP Workstation: [id number]    Medications:  dialysis solution 1.5% low-MG/low-CA      allopurinol   100 mg Oral Daily   amitriptyline  12.5 mg Oral QHS   budesonide  (PULMICORT ) nebulizer solution  0.25 mg Nebulization BID   busPIRone   5 mg Oral BID   dolutegravir   50 mg Oral Daily   emtricitabine -tenofovir  AF  1 tablet Oral Daily   ferric citrate   210 mg Oral TID WC   gabapentin   300 mg Oral TID   gentamicin  cream  1 Application Topical Daily   heparin   5,000 Units Subcutaneous Q8H   insulin  aspart  0-6 Units Subcutaneous TID WC   levothyroxine   150 mcg Oral QAC breakfast   minocycline   100 mg Oral BID   pantoprazole   40 mg Oral Daily   rosuvastatin   10 mg Oral Daily    Dialysis Orders: [facility]CPD 7x week-[facility] Primary Nephrologist Dr. 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 CT neck/head and MRV stable. 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 MRI w/contrast ordered. Neurology and Neurosurgery consulted. ESRD -  on PD, compliant with treatment with no issues at home. Tolerating PD overnight. Plan for PD tonight and will raise to 6 exchanges for the next 2 nights to enhance clearance since she will be getting contrast. Asked bedside RN to obtain a standing weight. Likely will continue using all 1.5% bags tonight 2nd current low Bps. Hypertension/volume  - Blood pressures are trending down, likely 2nd to possible adrenal issues. Not on any BP meds. ACTH, cortisol, FSH, LH, TSH, T4, insulin -like factor ordered. Per primary. Anemia of CKD - Hgb trending down, now 10.2. Fe/ESA is not indicated  at this time. Follow trend closely Secondary Hyperparathyroidism -  Corr Ca ok, checking phos in AM Nutrition - Renal diet with fluid restriction  Charmaine Piety, NP [facility] 08/01/2024,2:48 PM  LOS: 0 days

## 2024-08-01 NOTE — Plan of Care (Signed)
   Problem: Education: Goal: Ability to describe self-care measures that may prevent or decrease complications (Diabetes Survival Skills Education) will improve Outcome: Progressing Goal: Individualized Educational Video(s) Outcome: Progressing   Problem: Coping: Goal: Ability to adjust to condition or change in health will improve Outcome: Progressing

## 2024-08-01 NOTE — Care Management Obs Status (Addendum)
 MEDICARE OBSERVATION STATUS NOTIFICATION   Patient Details  Name: [name] MRN: [id number] Date of Birth: 1966/06/21   Medicare Observation Status Notification Given:  Yes Obs signed by patient copy will be mailed to patient home address.    Rella Egelston 08/01/2024, 4:35 PM

## 2024-08-01 NOTE — Progress Notes (Signed)
 OT Evaluation  Clinical Impression: Pt is typically independent in ADL and mobility without DME, drives, and does her own IADL. Recently though she's experienced lack of appetite and so family/friends have been providing meals (she has experienced about a 60lbs of recent weight loss). Today she presents with improving headache. She continues to be light sensitive, and reports that she needs glasses (she has the prescription but has not gotten glasses yet) she reports that blurriness of vision has improved with headache. Still present, but improving. Today she was functional for locating food items, reading labels on foods and condiments, and short room navigation. OT will continue to assess vision during stay. Pt overall GCA for seated ADL and brief standing. Recommend DME for dynamic movement in standing/upright. OT will continue to follow acutely and recommending HHOT post-acute to maximize safety and independence in ADL and functional transfers. Next session to focus on energy conservation as well as sink level grooming.     08/01/24 0900  OT Visit Information  Last OT Received On 08/01/24  Assistance Needed +1  History of Present Illness 58 y.o. female presents to [facility] 07/30/24 with a headache, blurry vision, nausea and trouble walking. 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 PMHx: HIV on therapy, HTN, DM, Grave's disease, prior DVT, aortic dissection status post-repair, ESRD on peritoneal dialysis  Precautions  Precautions Fall  Recall of Precautions/Restrictions Intact  Restrictions  Weight Bearing Restrictions Per Provider Order No  Home Living  Family/patient expects to be discharged to: Private residence  Living Arrangements Alone  Available Help at Discharge Family;Available 24 hours/day  Type of Home Other(Comment) (townhome)  Home Access Stairs to enter  Entrance Stairs-Number of Steps 2  Entrance Stairs-Rails None  Home Layout Two level;1/2 bath  on main level;Bed/bath upstairs  Alternate Level Stairs-Number of Steps 10  Alternate Level Stairs-Rails Right;Left;Can reach both  Air traffic controller  Home Equipment None  Additional Comments for the past month family has been bringing her food. bc she says that she has lost her appetite and she is not eating, so she has gone from 220lbs to 160lbs (eating once a day)  Prior Function  Prior Level of Function  Independent/Modified Independent;Driving (on disability since April 2024)  Mobility Comments community amb, drove self to dialysis  ADLs Comments independent in ADL, but family/friends providing food for about a month  Pain Assessment  Pain Assessment 0-10  Pain Score 5  Pain Location headache and BLE pain  Pain Descriptors / Indicators Headache;Burning;Pins and needles  Pain Intervention(s) Monitored during session;Repositioned;RN gave pain meds during session  Cognition  Arousal Alert  Behavior During Therapy [facility] for tasks assessed/performed  Cognition Cognition impaired  Awareness Intellectual awareness intact;Online awareness impaired  Memory impairment (select all impairments) Short-term memory  Executive functioning impairment (select all impairments) Reasoning;Problem solving  OT - Cognition Comments Pt often reporting conflicting information about symptoms,  Vision- History  Baseline Vision/History  (should wear glasses, has perscription, but does not have glasses)  Ability to See in Adequate Light 1 Impaired (light sensitive currently)  Patient Visual Report Blurring of vision  Vision- Assessment  Vision Assessment? Yes  Eye Alignment WFL  Ocular Range of Motion [facility]  Alignment/Gaze Preference WDL  Tracking/Visual Pursuits Able to track stimulus in all quads without difficulty  Additional Comments Light sensitive, reports that vision has improved with headache  ADL  Overall ADL's  Needs assistance/impaired   Eating/Feeding Independent  Grooming Contact guard  assist;Standing  Upper Body Bathing Set up;Sitting  Lower Body Bathing Set up;Sitting/lateral leans  Upper Body Dressing  Supervision/safety;Sitting  Lower Body Dressing Contact guard assist;Sit to/from stand  Toilet Transfer Contact guard assist;Stand-pivot  Toilet Transfer Details (indicate cue type and reason) simulated through recliner transfer  Toileting- Clothing Manipulation and Hygiene Contact guard assist;Sit to/from stand  Tub/ Psychologist, forensic guard assist;Rolling walker (2 wheels)  Functional mobility during ADLs Contact guard assist;Rolling walker (2 wheels)  General ADL Comments decreased balance, activity tolerance, vision  Bed Mobility  Overal bed mobility Modified Independent  Transfers  Overall transfer level Needs assistance  Equipment used 1 person hand held assist  Transfers Bed to chair/wheelchair/BSC  Bed to/from chair/wheelchair/BSC transfer type: Step pivot  Step pivot transfers Contact guard assist  General transfer comment GCA for safety  Balance  Overall balance assessment Needs assistance  Sitting-balance support No upper extremity supported;Feet supported  Sitting balance-Leahy Scale Good  Sitting balance - Comments able to don socks without issue  Standing balance support Single extremity supported  Standing balance-Leahy Scale Poor  Standing balance comment reaching for environmental support, recommend DME for future  OT - End of Session  Equipment Utilized During Treatment Gait belt  Activity Tolerance Patient tolerated treatment well  Patient left in chair;with call bell/phone within reach;with nursing/sitter in room  Nurse Communication Mobility status  OT Assessment  OT Recommendation/Assessment Patient needs continued OT Services  OT Visit Diagnosis Unsteadiness on feet (R26.81);Other abnormalities of gait and mobility (R26.89);Muscle weakness (generalized) (M62.81);Low vision, both  eyes (H54.2);Other symptoms and signs involving the nervous system (R29.898);Pain  Pain - Right/Left  (headache, BLE)  Pain - part of body Leg (head)  OT Problem List Decreased activity tolerance;Impaired balance (sitting and/or standing);Impaired vision/perception;Decreased safety awareness;Pain  OT Plan  OT Frequency (ACUTE ONLY) Min 2X/week  OT Treatment/Interventions (ACUTE ONLY) Self-care/ADL training;Energy conservation;DME and/or AE instruction;Therapeutic activities;Balance training;Patient/family education  AM-PAC OT 6 Clicks Daily Activity Outcome Measure (Version 2)  Help from another person eating meals? 4  Help from another person taking care of personal grooming? 3  Help from another person toileting, which includes using toliet, bedpan, or urinal? 3  Help from another person bathing (including washing, rinsing, drying)? 3  Help from another person to put on and taking off regular upper body clothing? 3  Help from another person to put on and taking off regular lower body clothing? 3  6 Click Score 19  Progressive Mobility  What is the highest level of mobility based on the mobility assessment? Level 4 (Ambulates with assistance) - Balance while stepping forward/back - Complete  Activity Pivoted/transferred from bed to chair  OT Recommendation  Recommendations for Other Services PT consult  Follow Up Recommendations Home health OT  Patient can return home with the following A little help with walking and/or transfers;A little help with bathing/dressing/bathroom;Assistance with cooking/housework  Functional Status Assessent Patient has had a recent decline in their functional status and demonstrates the ability to make significant improvements in function in a reasonable and predictable amount of time.  OT Equipment BSC/3in1  Individuals Consulted  Consulted and Agree with Results and Recommendations Patient  Acute Rehab OT Goals  Patient Stated Goal get better  OT Goal  Formulation With patient  Time For Goal Achievement 08/15/24  Potential to Achieve Goals Good  OT Time Calculation  OT Start Time (ACUTE ONLY) 0914  OT Stop Time (ACUTE ONLY) 0946  OT Time Calculation (min) 32 min  OT General Charges  $  OT Visit 1 Visit  OT Evaluation  $OT Eval Low Complexity 1 Low  OT Treatments  $Self Care/Home Management  8-22 mins   Leita DEL OTR/L Acute Rehabilitation Services Office: [phone]

## 2024-08-01 NOTE — TOC Initial Note (Signed)
 Transition of Care [facility]) - Initial/Assessment Note    Patient Details  Name: [name] MRN: [id number] Date of Birth: [birth year]  Transition of Care [facility] CM/SW Contact:    Felicia JULIANNA George, RN Phone Number: 08/01/2024, 11:55 AM  Clinical Narrative:                 [name] is a 58 y.o. female with medical history significant of sarcoidosis, ESRD on peritoneal dialysis, hidradenitis, HLD, Graves' disease status post thyroidectomy on Synthroid , hypertension, history of aortic dissection status post stent placement presenting with headache.  Pt is from home alone. She says her daughter, mom and sister can check on her.  She drives self but family can assist if needed.  Pt manages her peritoneal dialysis at home. She also manages her own medications.   Home health set up with [name]. Pt has used them in the past and asked to use them again. Referral sent and information on the AVS.  CM will order her a walker through [employer] and have it delivered to the room.  IP Care management following.   Expected Discharge Plan: Home w Home Health Services Barriers to Discharge: Continued Medical Work up   Patient Goals and CMS Choice   CMS Medicare.gov Compare Post Acute Care list provided to:: Patient Choice offered to / list presented to : Patient      Expected Discharge Plan and Services   Discharge Planning Services: CM Consult Post Acute Care Choice: Home Health, Durable Medical Equipment Living arrangements for the past 2 months: Single Family Home                           HH Arranged: PT, OT HH Agency: [name] Date [facility] Agency Contacted: 08/01/24   Representative spoke with at [facility] Agency: [name]  Prior Living Arrangements/Services Living arrangements for the past 2 months: Single Family Home Lives with:: Self Patient language and need for interpreter reviewed:: Yes Do you feel safe going back to the place where you live?: Yes           Current home services: DME (none/ peritoneal dialysis) Criminal Activity/Legal Involvement Pertinent to Current Situation/Hospitalization: No - Comment as needed  Activities of Daily Living   ADL Screening (condition at time of admission) Independently performs ADLs?: Yes (appropriate for developmental age) Is the patient deaf or have difficulty hearing?: No Does the patient have difficulty seeing, even when wearing glasses/contacts?: No Does the patient have difficulty concentrating, remembering, or making decisions?: No  Permission Sought/Granted                  Emotional Assessment Appearance:: Appears stated age Attitude/Demeanor/Rapport: Engaged Affect (typically observed): Accepting Orientation: : Oriented to Self, Oriented to Place, Oriented to  Time, Oriented to Situation   Psych Involvement: No (comment)  Admission diagnosis:  Migraine [G43.909] Patient Active Problem List   Diagnosis Date Noted   Migraine 07/31/2024   Leukocytosis 07/31/2024   Hallucinations 06/03/2024   Acute left-sided low back pain 03/29/2024   Localized skin mass, lump, or swelling 03/29/2024   Vaginal itching 01/18/2024   H/O aortic aneurysm repair 08/25/2023   Thoracic back pain 03/07/2023   At risk for sleep apnea 03/07/2023   Hypothyroidism 02/13/2023   History of aortic dissection 01/29/2023   Chronic pain of both knees 07/29/2022   Chronic illness 10/27/2021   Paresthesias 06/30/2021   Chronic pain of right hand 06/30/2021  Chronic low back pain 06/30/2021   Voice hoarseness 12/15/2020   Postoperative breast asymmetry 02/28/2020   Chronic gout 01/27/2020   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   CKD (chronic kidney disease) stage V requiring chronic dialysis (HCC) 07/12/2019   Normocytic anemia 07/12/2019   Preventative health care 09/10/2018   Port-A-Cath in place 07/12/2018   Family history of breast cancer    Family history of prostate cancer    Family  history of lung cancer    Malignant neoplasm of lower-inner quadrant of right breast of female, estrogen receptor positive (HCC) 05/10/2018   Hyperlipidemia 04/26/2017   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 [name] [address] Phone: [phone] Fax: [phone]  [name] [address] [place] [address] Phone: [phone] Fax: [phone]  [id number] [place], [place] - [address] [facility] [address] [name] [address] Phone: [phone] Fax: [phone]     Social Drivers of Health (SDOH) Social History: SDOH Screenings   Food Insecurity: No Food Insecurity (07/31/2024)  Recent Concern: Food Insecurity - Food Insecurity Present (06/27/2024)  Housing: Low Risk  (07/31/2024)  Recent Concern: Housing - High Risk (06/27/2024)  Transportation Needs: No Transportation Needs (07/31/2024)  Utilities: Not At Risk (07/31/2024)  Recent Concern: Utilities - At Risk (06/27/2024)  Alcohol  Screen: Low Risk  (06/27/2024)  Depression (PHQ2-9): Low Risk  (06/27/2024)  Recent Concern: Depression (PHQ2-9) - Medium Risk (06/05/2024)  Financial Resource Strain: Medium Risk (06/27/2024)  Physical Activity: Inactive (06/27/2024)  Social Connections: Unknown (06/27/2024)  Stress: Stress Concern Present  (06/27/2024)  Tobacco Use: Medium Risk (07/31/2024)  Health Literacy: Adequate Health Literacy (06/27/2024)   SDOH Interventions:     Readmission Risk Interventions    02/21/2023    4:05 PM  Readmission Risk Prevention Plan  Transportation Screening Complete  Medication Review (RN Care Manager) Complete  PCP or Specialist appointment within 3-5 days of discharge Complete  HRI or Home Care Consult Complete  SW Recovery Care/Counseling Consult Complete  Palliative Care Screening Not Applicable  Skilled Nursing Facility Not Applicable

## 2024-08-01 NOTE — Procedures (Signed)
 PD note:  Patient tolerated treatment well.  Patient dressing dry, clean, and intact.   She retained 231 ml of fluid with the treatment.

## 2024-08-01 NOTE — Plan of Care (Signed)
 OOB to chair with therapy. Order for pre med before MRI. Foods and fluids well tolerated.     Problem: Education: Goal: Ability to describe self-care measures that may prevent or decrease complications (Diabetes Survival Skills Education) will improve Outcome: Progressing   Problem: Coping: Goal: Ability to adjust to condition or change in health will improve Outcome: Progressing   Problem: Nutritional: Goal: Maintenance of adequate nutrition will improve Outcome: Progressing   Problem: Skin Integrity: Goal: Risk for impaired skin integrity will decrease Outcome: Progressing   Problem: Safety: Goal: Ability to remain free from injury will improve Outcome: Progressing

## 2024-08-01 NOTE — Progress Notes (Addendum)
 This RN was told in report from day RN that patient is very claustrophobic and will need valium prior to MRI scan.   On assessment prior to MRI, patient was asleep but fully arousable and alert. Endorsed she needed the medicine for the scan. However, her BP was soft, SBP in the 90s.  This Investment banker, operational reached out to [facility] floor coverage Dr. Shona and Midodrine and valium were given together in anticipation for MRI.   Received patient back from MRI, Patient very sleepy but arousable. Patient says they have to re do it and then falls bask asleep.  Per MRI Patient was too sleepy for scan and her breathing pattern was an issue.  This Investment banker, operational assess patient and vitals, patient audibly snoring and says she doesn't know what they gave her to make her this sleepy. Patient remains A/Ox4 and intact when awoken from sleep.  BP very soft SBP in the 70-80s, maps in the 60.  CBG 75  Evening meds held due to sedation.  Reached back out to Dr. Shona. D50, fluids, and other orders placed. Lost IV access when D50 was pushed, patient only received half of it. Patient endorses pain at IV site and is difficult to flush.   IV team consulted and Dr. Shona updated.   This Investment banker, operational began to check BP on leg and were much improved from arm. Seems to be a 20-30 point SBP difference between them.   Interventions for hypotension were then discontinued.   Notified Dialysis RN that patient was back to restart peritoneal dialysis, says they are with another patient and will be 2hrs.  0015 Patient still sleeping but will arouse and remains neurologically intact. BP improved.  Dr. Shona aware. Valium added to allergy so she will not have to take this again.   0530 Patient awakes to voice. A/Ox4. Still very sleepy. Able to swallow pills. Endorses sleepiness and trying to wake up, never wants valium again. Reiterated to pt that we added it as an allergy to her chart. BP WNL. VSS.

## 2024-08-01 NOTE — Progress Notes (Signed)
 NEUROLOGY CONSULT FOLLOW UP NOTE   Date of service: August 01, 2024 Patient Name: [name] MRN:  [id number] DOB:  [birth year]  Interval Hx/subjective  Patient reports improvement in headache overnight after headache cocktail from 8/10 severity 2/10 severity today with improved light sensitivity and nausea. Patient reports that she has had visual hallucinations for over 1 month previously attributed to hyperphosphatemia.  No auditory hallucinations.  Visual hallucinations included feeling like the holes in her bread were moving and her [place] food appeared to look like maggots. Patient reports significant leg burning today worse than overnight last night. Patient unable to tolerate MRI yesterday though reported a 3-hour delay in imaging following claustrophobia medication.  Vitals   Vitals:   08/01/24 0355 08/01/24 0500 08/01/24 0803 08/01/24 0852  BP: 108/62   (!) 103/47  Pulse: 88  88 89  Resp: 17  17 18   Temp: 99.5 F (37.5 C)   98.1 F (36.7 C)  TempSrc: Oral     SpO2: 99%  99% 99%  Weight:  86 kg    Height:        Body mass index is 31.55 kg/m.  Physical Exam   Constitutional: Appears well-developed and well-nourished.  Psych: Affect appropriate to situation. Calm and cooperative with exam Eyes: No scleral injection.  HENT: No OP obstrucion.  Head: Normocephalic.  Cardiovascular: Normal rate and regular rhythm.  Respiratory: Effort normal, non-labored breathing on room air GI: Soft.  No distension. There is no tenderness.  Skin: WDI.   Neurologic Examination   Mental Status: Patient is awake, alert, oriented to person, place, month, year, and situation. Patient is able to give a clear and coherent history. No signs of aphasia or neglect Cranial Nerves: II: Pupils are equal, round, and reactive to light.   III,IV, VI: EOMI without ptosis or diploplia.  V: Facial sensation is intact and symmetric to light touch VII: Facial movement is symmetric  resting and with movement VIII: Hearing is intact to voice X: Palate elevates symmetrically XI: Shoulder shrug is symmetric. XII: Tongue protrudes midline  Motor: Tone is normal. Bulk is normal. 4/5 strength was present in all four extremities.  Sensory: Sensation is symmetric to light touch in the arms and legs. No extinction to DSS present.  Patient reports burning sensation in bilateral lower extremities that is constant. Plantars: Toes are downgoing bilaterally. Cerebellar: FNF and HKS are intact bilaterally  Medications  Current Facility-Administered Medications:    acetaminophen  (TYLENOL ) tablet 1,000 mg, 1,000 mg, Oral, Q6H PRN, Dahal, Binaya, MD   albuterol  (PROVENTIL ) (2.5 MG/3ML) 0.083% nebulizer solution 3 mL, 3 mL, Inhalation, Q4H PRN, Eldonna Elspeth PARAS, MD   allopurinol  (ZYLOPRIM ) tablet 100 mg, 100 mg, Oral, Daily, Eldonna Elspeth PARAS, MD, 100 mg at 07/31/24 1008   amitriptyline (ELAVIL) tablet 12.5 mg, 12.5 mg, Oral, QHS, Dahal, Binaya, MD   budesonide  (PULMICORT ) nebulizer solution 0.25 mg, 0.25 mg, Nebulization, BID, Uhlorn, Garett M, RPH, 0.25 mg at 08/01/24 9196   busPIRone  (BUSPAR ) tablet 5 mg, 5 mg, Oral, BID, Dahal, Binaya, MD   dialysis solution 1.5% low-MG/low-CA dianeal solution, , Intraperitoneal, Q24H, Eldonna Elspeth PARAS, MD   emtricitabine -tenofovir  AF (DESCOVY ) 200-25 MG per tablet 1 tablet, 1 tablet, Oral, Daily, Eldonna Elspeth PARAS, MD, 1 tablet at 07/31/24 1006   ferric citrate  (AURYXIA ) tablet 210 mg, 210 mg, Oral, TID WC, Eldonna Elspeth PARAS, MD   gentamicin  cream (GARAMYCIN ) 0.1 % 1 Application, 1 Application, Topical, Daily, Lenon Charmaine BRAVO, NP, 1 Application at  07/31/24 2257   heparin  injection 5,000 Units, 5,000 Units, Subcutaneous, Q8H, Eldonna Elspeth PARAS, MD, 5,000 Units at 08/01/24 0539   heparin  sodium (porcine) 3,000 Units in dialysis solution 1.5% low-MG/low-CA 6,000 mL dialysis solution, , Peritoneal Dialysis, PRN, Newton, Steven J, MD   insulin  aspart  (novoLOG ) injection 0-6 Units, 0-6 Units, Subcutaneous, TID WC, Eldonna Elspeth PARAS, MD   levothyroxine  (SYNTHROID ) tablet 150 mcg, 150 mcg, Oral, QAC breakfast, Eldonna Elspeth PARAS, MD, 150 mcg at 08/01/24 0539   metoCLOPramide  (REGLAN ) injection 5 mg, 5 mg, Intravenous, Q12H PRN, Eldonna Elspeth PARAS, MD   minocycline  (MINOCIN ) capsule 100 mg, 100 mg, Oral, BID, Eldonna Elspeth PARAS, MD, 100 mg at 07/31/24 2258   pantoprazole  (PROTONIX ) EC tablet 40 mg, 40 mg, Oral, Daily, Eldonna Elspeth PARAS, MD, 40 mg at 07/31/24 1008   prochlorperazine  (COMPAZINE ) injection 10 mg, 10 mg, Intravenous, Q6H PRN, Eldonna Elspeth PARAS, MD, 10 mg at 07/31/24 2036   rosuvastatin  (CRESTOR ) tablet 10 mg, 10 mg, Oral, Daily, Eldonna Elspeth PARAS, MD, 10 mg at 07/31/24 1006  Labs and Diagnostic Imaging   CBC:  Recent Labs  Lab 07/30/24 2001 07/30/24 2019 08/01/24 0033 08/01/24 0229  WBC 16.1*  --  13.2* 14.7*  NEUTROABS 13.0*  --   --   --   HGB 10.8*   < > 10.0* 10.2*  HCT 31.9*   < > 29.7* 30.3*  MCV 110.0*  --  108.8* 110.2*  PLT 200  --  164 168   < > = values in this interval not displayed.   Basic Metabolic Panel:  Lab Results  Component Value Date   NA 135 08/01/2024   K 3.7 08/01/2024   CO2 20 (L) 08/01/2024   GLUCOSE 106 (H) 08/01/2024   BUN 81 (H) 08/01/2024   CREATININE 14.10 (H) 08/01/2024   CALCIUM  8.2 (L) 08/01/2024   GFRNONAA 3 (L) 08/01/2024   GFRAA 39 (L) 07/14/2020   Lipid Panel:  Lab Results  Component Value Date   LDLCALC 59 01/11/2024   HgbA1c:  Lab Results  Component Value Date   HGBA1C 6.1 (H) 07/31/2024   INR  Lab Results  Component Value Date   INR 1.4 (H) 08/29/2023   APTT  Lab Results  Component Value Date   APTT 43 (H) 08/29/2023   AED levels: No results found for: PHENYTOIN, ZONISAMIDE, LAMOTRIGINE, LEVETIRACETA  CT angio Head and Neck with contrast(Personally reviewed): 1. Negative CTA for acute large vessel occlusion or other emergent findings. No hemodynamically  significant or correctable stenosis. No intracranial aneurysm. 2. No no other acute intracranial abnormality. 3. Chronic microvascular ischemic disease.  MRI Brain (Personally reviewed): 1. 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 Recommend dedicated MRI of the pituitary/sella with and without contrast for further evaluation. 2. Mild chronic microvascular ischemic changes. 3. No acute findings  MRV brain (Personally reviewed): 1. Unremarkable MRV of the head.   IOP of the right eye: 10 with low suspicion for glaucoma per EDP   Assessment   Gerlean Cid Whan is a 58 y.o. female hx of HIV on therapy, HTN, DM, Grave's disease, prior DVT, aortic dissection status post-repair, ESRD on peritoneal dialysis, who presents to ED on 10/14 for CC of headache that began in the early hours of Monday morning. Patient endorses nausea, and vomited once at home. She previously had blurry vision, but states it has resolved. Imaging showed sellar mass measuring up to 1.5cm. Recommend completing MRI pituitary  with/wo contrast for further evaluation. Suspicion for pituitary mass as cause of her headache, limited visual temporal field, and visual hallucinations. Discussed findings with patient and recommend completing repeat MRI pituitary with contrast when possible, patient was unable to tolerate imaging earlier due to reported delay in imaging with claustrophobia pretreatment.   Recommendations  - Gabapentin  300 mg TID for lower extremity neuropathic pain complaints  - Can repeat headache cocktail as needed - Continue amitriptyline 12.5 mg nightly for 1 week then increase to 25 mg nightly - Further imaging per NSGY - Definitive diagnosis and treatment of pituitary mass per neurosurgery and endocrine teams ___________________________________________________________  Signed,  Mimi LELON Ny, NP [facility]

## 2024-08-01 NOTE — Progress Notes (Signed)
 Patient transported to MRI. Pt pre-medicated with valium & midodrine for BP support. PD disconnected by Dialysis RN.

## 2024-08-01 NOTE — Consult Note (Signed)
 Assessment : 58 year old lady with a past medical history significant for sarcoidosis, end-stage renal disease on peritoneal dialysis, hyperlipidemia and HIV.  On the 14th patient was sitting at home watching TV when she had a sudden onset of headache and this was intolerable.  Patient had headache on the top of her head and in the frontal area.  Patient was brought to the emergency room for evaluation and admitted for headache management.  Patient underwent an MRI which demonstrated a mass in the pituitary gland.  She was evaluated by neurology who found the patient to have bitemporal hemianopsia, affecting the left side more than the right.  I was consulted today for evaluation of this pituitary mass.  Plan : On exam today patient was sleepy because she had just gotten pain medication.  She says that prior to this, her headaches were better.  I tried to do a visual field examination however this was not very reliable.  Patient says that her vision is improving.  Over the past 2 days she has not had any significant polyuria and her sodium is 135.  I reviewed my findings with the admitting physician, Dr. Arlice.  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  The appearance on the MRI is that of a multi intensity mass with upward deviation.  Given the nonsolid appearance, it makes the possibility of a hemorrhage very high.  Typically, these can definitely result in visual problems but given the fact that she feels that her vision is improving, I do not think that decompression is indicated.  In the absence of any overt symptoms of Addison's disease [her sodium is 135 and stable], and the lack of polyuria [diabetes insipidus is unlikely], pituitary function seems to be intact.  I have ordered hormonal panel to check on her pituitary hormones as well as an MRI of the brain with and without contrast to look at the pituitary gland little better.  More likely  than not, we can follow observation for this and discharge can be planned with prednisone  supplementation with follow-up recommended for endocrinology, as long as her vision is not deteriorating.  I will follow-up on the lab work.   Social History   Socioeconomic History   Marital status: Single    Spouse name: Not on file   Number of children: 1   Years of education: Not on file   Highest education level: Not on file  Occupational History   Occupation: Investment banker, corporate  Tobacco Use   Smoking status: Former    Current packs/day: 0.00    Average packs/day: 0.5 packs/day for 15.0 years (7.5 ttl pk-yrs)    Types: Cigarettes    Start date: 04/12/2017    Quit date: 2019    Years since quitting: 6.7   Smokeless tobacco: Never  Vaping Use   Vaping status: Never Used  Substance and Sexual Activity   Alcohol  use: Not Currently    Comment: occasional wine   Drug use: No   Sexual activity: Not Currently    Birth control/protection: Post-menopausal  Other Topics Concern   Not on file  Social History Narrative   Right Handed    Lives in a two story home   Social Drivers of Health   Financial Resource Strain: Medium Risk (06/27/2024)   Overall Financial Resource Strain (CARDIA)    Difficulty of Paying Living Expenses: Somewhat hard  Food Insecurity: No Food Insecurity (07/31/2024)   Hunger Vital Sign  Worried About Programme researcher, broadcasting/film/video in the Last Year: Never true    Ran Out of Food in the Last Year: Never true  Recent Concern: Food Insecurity - Food Insecurity Present (06/27/2024)   Hunger Vital Sign    Worried About Running Out of Food in the Last Year: Sometimes true    Ran Out of Food in the Last Year: Sometimes true  Transportation Needs: No Transportation Needs (07/31/2024)   PRAPARE - Administrator, Civil Service (Medical): No    Lack of Transportation (Non-Medical): No  Physical Activity: Inactive (06/27/2024)   Exercise Vital Sign    Days of Exercise per  Week: 0 days    Minutes of Exercise per Session: 0 min  Stress: Stress Concern Present (06/27/2024)   [employer] of Occupational Health - Occupational Stress Questionnaire    Feeling of Stress: To some extent  Social Connections: Unknown (06/27/2024)   Social Connection and Isolation Panel    Frequency of Communication with Friends and Family: More than three times a week    Frequency of Social Gatherings with Friends and Family: Three times a week    Attends Religious Services: More than 4 times per year    Active Member of Clubs or Organizations: No    Attends Banker Meetings: Never    Marital Status: Not on file  Intimate Partner Violence: Not At Risk (07/31/2024)   Humiliation, Afraid, Rape, and Kick questionnaire    Fear of Current or Ex-Partner: No    Emotionally Abused: No    Physically Abused: No    Sexually Abused: No    Family History  Problem Relation Age of Onset   Hypertension Mother    Cancer Mother        laryngeal   Heart disease Mother        stent   Pancreatic cancer Father    Hypertension Father    Lung cancer Father 37       hx smoking   Hypertension Sister    Hypertension Sister    Hypertension Brother    Breast cancer Maternal Aunt 74   Breast cancer Maternal Aunt        dx 60+   Cancer Maternal Uncle        Lung CA   Breast cancer Paternal Aunt 54   Breast cancer Paternal Aunt        dx 24's   Breast cancer Paternal Aunt        dx 50's   Prostate cancer Paternal Uncle    Prostate cancer Paternal Uncle    Lung cancer Paternal Uncle    Breast cancer Cousin 47   Breast cancer Cousin        dx <50   Breast cancer Cousin        dx <50   Breast cancer Cousin        dx <50   Heart disease Other    Hypertension Other    Stroke Other        Grandparent   Kidney disease Other        Grandparent   Diabetes Other        FH of Diabetes   Colon cancer Neg Hx    Esophageal cancer Neg Hx    Rectal cancer Neg Hx    Stomach  cancer Neg Hx     Allergies  Allergen Reactions   Genvoya [Elviteg-Cobic-Emtricit-Tenofaf] Hives   Lisinopril Cough   Aldactone  [Spironolactone ]  Hives   Tegaderm Ag Mesh [Silver] Itching    Past Medical History:  Diagnosis Date   Acute pain of right shoulder due to trauma 06/08/2023   Acute pancreatitis 10/23/2021   Acute renal failure superimposed on stage 4 chronic kidney disease (HCC) 10/24/2021   Allergy    Anemia    Normocytic   Antibiotic-induced yeast infection 09/28/2020   Anxiety    Asthma    Bronchitis 2005   Bursitis of left shoulder 09/06/2019   CKD (chronic kidney disease)    Dialysis on Mon-Wed- Fri   CLASS 1-EXOPHTHALMOS-THYROTOXIC 02/08/2007   Cognitive dysfunction 02/21/2020   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  Genes Analyzed (43 total): APC*, ATM*, AXIN2, BARD1, BMPR1A, BRCA1*, BRCA2*, BRIP1*, CDH1*, CDK4, CDKN2A, CHEK2*, DICER1, GALNT12, HOXB13, MEN1, MLH1*, MRE11A, MSH2*, MSH3, MSH6*, MUTYH*, NBN, NF1*, NTHL1, PALB2*, PMS2*, POLD1, POLE, PTEN*, RAD50, RAD51C*, RAD51D*, RET, SDHB, SDHD, SMAD4, SMARCA4, STK11 and TP53* (sequencing and deletion/duplication); EGFR (s   GERD 07/24/2006   GRAVE'S DISEASE 01/01/2008   History of hidradenitis suppurativa    History of kidney stones    History of thrush    HIV DISEASE 07/24/2006   dx March 05   Hyperlipidemia    HYPERTENSION  07/24/2006   Hyperthyroidism 08/2006   Grave's Disease -diffuse radiotracer uptake 08/25/06 Thyroid  scan-Cold nodule to R lower lobe of thyrorid   Ileus (HCC) 02/14/2023   Left bundle branch block (LBBB)    Malnutrition of moderate degree 02/08/2023   Menometrorrhagia    hx of   Nephrolithiasis    Panniculitis 05/12/2020   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 Completed 2 yrs Prednisone  after lung bx confirmation. No symptoms since then.   Sebaceous cyst 04/21/2020   Suppurative hidradenitis    Thyroid  cancer (HCC)    THYROID  NODULE, RIGHT 02/08/2007    Past Surgical History:  Procedure Laterality Date   AORTIC ARCH DEBRANCHING N/A 08/25/2023   Procedure: AORTIC ARCH DEBRANCHING USING 12X8X8MM HEMASHIELD GRAFT;  Surgeon: Lucas Dorise POUR, MD;  Location: [facility];  Service: Open Heart Surgery;  Laterality: N/A;  with bypasses to the innominate and left common carotid arteries   APPLICATION OF WOUND VAC N/A 01/20/2021   Procedure: APPLICATION OF WOUND VAC;  Surgeon: Lowery Estefana RAMAN, DO;  Location: [facility];  Service: Government social research officer;  Laterality: N/A;   BREAST EXCISIONAL BIOPSY Right 04/26/2018   right axilla negative   BREAST EXCISIONAL BIOPSY Left 04/26/2018   left axilla negative   BREAST LUMPECTOMY Right 10/03/2018   malignant   BREAST LUMPECTOMY WITH RADIOACTIVE SEED AND SENTINEL LYMPH NODE  BIOPSY Right 10/03/2018   Procedure: RIGHT BREAST LUMPECTOMY WITH RADIOACTIVE SEED AND SENTINEL LYMPH NODE MAPPING;  Surgeon: Vanderbilt Ned, MD;  Location: [facility];  Service: General;  Laterality: Right;   BREAST SURGERY  1997   Breast Reduction    CAPD INSERTION N/A 12/21/2023   Procedure: LAPAROSCOPIC INSERTION CONTINUOUS AMBULATORY PERITONEAL DIALYSIS  (CAPD) CATHETER; LAPAROSCOPIC OMENTUMPEXY;  Surgeon: Magda Ned SAILOR, MD;  Location: [facility];  Service: Vascular;  Laterality: N/A;   CYSTOSCOPY W/ URETERAL STENT REMOVAL  11/09/2012   Procedure: CYSTOSCOPY WITH STENT REMOVAL;  Surgeon: Ricardo Likens, MD;  Location: [facility];  Service: Urology;  Laterality: Right;   CYSTOSCOPY WITH RETROGRADE PYELOGRAM, URETEROSCOPY AND STENT PLACEMENT  11/09/2012   Procedure: CYSTOSCOPY WITH RETROGRADE PYELOGRAM, URETEROSCOPY AND STENT PLACEMENT;  Surgeon: Ricardo Likens, MD;  Location: [facility];  Service: Urology;  Laterality: Left;  LEFT URETEROSCOPY, STONE MANIPULATION, left STENT exchange    CYSTOSCOPY WITH STENT PLACEMENT  10/02/2012   Procedure: CYSTOSCOPY WITH STENT PLACEMENT;  Surgeon: Ricardo Likens, MD;  Location: [facility];  Service: Urology;  Laterality: Left;   DEBRIDEMENT AND CLOSURE WOUND N/A 01/20/2021   Procedure: Excision of abdominal wound with closure;  Surgeon: Lowery Estefana RAMAN, DO;  Location: [facility];  Service: Plastics;  Laterality: N/A;   DILATION AND CURETTAGE OF UTERUS  11/2002   s/p for 1st trimester nonviable pregnancy   EYE SURGERY     sty under eyelid   INCISE AND DRAIN ABCESS  08/2002   s/p I &D for righ inframmary fold hidradenitis   INCISION AND DRAINAGE PERITONSILLAR ABSCESS  12/2001   IR CV LINE INJECTION  06/07/2018   IR FLUORO GUIDE CV LINE RIGHT  01/30/2023   IR IMAGING GUIDED PORT INSERTION  06/20/2018   IR REMOVAL TUN ACCESS W/ PORT W/O FL MOD SED  06/20/2018   IR US  GUIDE VASC ACCESS RIGHT  01/30/2023   IRRIGATION AND DEBRIDEMENT ABSCESS  01/31/2012    Procedure: IRRIGATION AND DEBRIDEMENT ABSCESS;  Surgeon: Alm VEAR Angle, MD;  Location: [facility];  Service: General;  Laterality: Right;  right breast and axilla    LAPAROSCOPIC REPOSITIONING CAPD CATHETER Left 01/10/2024   Procedure: Removal of peritoneal dialysis Catheter.;  Surgeon: Magda Ned SAILOR, MD;  Location: [facility] OR;  Service: Vascular;  Laterality: Left;   LAPAROSCOPY N/A 01/10/2024   Procedure: LAPAROSCOPY, DIAGNOSTIC;  Surgeon: Magda Ned SAILOR, MD;  Location: [facility] OR;  Service: Vascular;  Laterality: N/A;   NEPHROLITHOTOMY  10/02/2012   Procedure: NEPHROLITHOTOMY PERCUTANEOUS;  Surgeon: Ricardo Likens, MD;  Location: [facility];  Service: Urology;  Laterality: Right;  First Stage Percutaneous Nephrolithotomy with Surgeon Access, Left Ureteral Stent     NEPHROLITHOTOMY  10/04/2012   Procedure: NEPHROLITHOTOMY PERCUTANEOUS SECOND LOOK;  Surgeon: Ricardo Likens, MD;  [facility]: [facility];  Service: Urology;  Laterality: Right;      NEPHROLITHOTOMY  10/08/2012   Procedure: NEPHROLITHOTOMY PERCUTANEOUS;  Surgeon: Ricardo Likens, MD;  Location: [facility];  Service: Urology;  Laterality: Right;  THIRD STAGE, nephrostomy tube exchange x 2   NEPHROLITHOTOMY  10/11/2012   Procedure: NEPHROLITHOTOMY PERCUTANEOUS SECOND LOOK;  Surgeon: Ricardo Likens, MD;  Location: [facility];  Service: Urology;  Laterality: Right;  RIGHT 4 STAGE PERCUTANOUS NEPHROLITHOTOMY, right URETEROSCOPY WITH HOLMIUM LASER    PANNICULECTOMY N/A 12/21/2020   Procedure: PANNICULECTOMY;  Surgeon: Lowery Estefana RAMAN, DO;  Location: [facility];  Service: Plastics;  Laterality: N/A;   PERCUTANEOUS NEPHROSTOLITHOTOMY  04/2022   PORT-A-CATH REMOVAL N/A 07/16/2020   Procedure: REMOVAL PORT-A-CATH;  Surgeon: Vanderbilt Ned, MD;  Location: [facility];  Service: General;  Laterality: N/A;   PORTACATH PLACEMENT Left 05/17/2018   Procedure: INSERTION PORT-A-CATH;  Surgeon: Vernetta Berg, MD;  Location: [facility];  Service:  General;  Laterality: Left;   RADICAL NECK DISSECTION  04/12/2017   limited/notes 04/12/2017   RADICAL NECK DISSECTION N/A 04/12/2017   Procedure: RADICAL NECK DISSECTION;  Surgeon: Carlie Clark, MD;  Location: [facility] OR;  Service: ENT;  Laterality: N/A;  limited neck dissection 2 hours total   REDUCTION MAMMAPLASTY Bilateral 1998   RIGHT/LEFT HEART CATH AND CORONARY ANGIOGRAPHY N/A 03/12/2020   Procedure: RIGHT/LEFT HEART CATH AND CORONARY ANGIOGRAPHY;  Surgeon: Cherrie Toribio SAUNDERS, MD;  [facility];  Service: Cardiovascular;  Laterality: N/A;   [place]  1994   TEE WITHOUT CARDIOVERSION N/A 08/25/2023   Procedure: TRANSESOPHAGEAL ECHOCARDIOGRAM;  Surgeon: Lucas Dorise POUR, MD;  Location: [facility];  Service: Open Heart Surgery;  Laterality: N/A;   TENCKHOFF CATHETER INSERTION Left 01/10/2024   Procedure: INSERTION, CATHETER, DIALYSIS, PERITONEAL;  Surgeon: Magda Ned SAILOR, MD;  Location: [facility];  Service: Vascular;  Laterality: Left;   THORACIC AORTIC ENDOVASCULAR STENT GRAFT N/A 02/02/2023   Procedure: THORACIC AORTIC ENDOVASCULAR STENT GRAFT;  Surgeon: Lanis Fonda BRAVO, MD;  Location: [facility] OR;  Service: Vascular;  Laterality: N/A;   THORACIC AORTIC ENDOVASCULAR STENT GRAFT N/A 08/25/2023   Procedure: THORACIC AORTIC ENDOVASCULAR STENT GRAFT;  Surgeon: Lanis Fonda BRAVO, MD;  Location: [facility];  Service: Vascular;  Laterality: N/A;   THYROIDECTOMY  04/12/2017   completion/notes 04/12/2017   THYROIDECTOMY N/A 04/12/2017   Procedure: THYROIDECTOMY;  Surgeon: Carlie Clark, MD;  Location: [facility] OR;  Service: ENT;  Laterality: N/A;  Completion Thyroidectomy   TOTAL THYROIDECTOMY  2010   ULTRASOUND GUIDANCE FOR VASCULAR ACCESS Right 08/25/2023   Procedure: ULTRASOUND GUIDANCE FOR VASCULAR ACCESS, RIGHT FEMORAL ARTERY;  Surgeon: Lanis Fonda BRAVO, MD;  Location: [facility] OR;  Service: Vascular;  Laterality: Right;     Physical Exam HENT:     Head: Normocephalic.     Nose: Nose normal.  Eyes:      Pupils: Pupils are equal, round, and reactive to light.  Cardiovascular:     Rate and Rhythm: Normal rate.  Pulmonary:     Effort: Pulmonary effort is normal.  Abdominal:     General: Abdomen is flat.  Musculoskeletal:     Cervical back: Normal range of motion.  Neurological:     Mental Status: She is alert.     Cranial Nerves: Cranial nerves 2-12 are intact.     Sensory: Sensation is intact.     Motor: Motor function is intact.     Coordination: Coordination is intact.     Comments: Visual field exam unreliable        Results for orders placed or performed during the hospital encounter of 07/30/24  MR BRAIN WO CONTRAST   Narrative   EXAM: MRI BRAIN WITHOUT CONTRAST 07/31/2024 03:13:51 PM  TECHNIQUE: Multiplanar multisequence MRI of the head/brain was performed without the administration of intravenous contrast.  COMPARISON: Same day MRV head and CTA head 07/30/2024.  CLINICAL HISTORY: Headache, new onset (Age >= 51y). Headache x5 days with neck stiffness.  FINDINGS:  BRAIN AND VENTRICLES: No acute infarct. No acute intracranial hemorrhage. Chronic microhemorrhages are present in the left frontal lobe and right parietal lobe. Scattered T2 FLAIR hyperintensity in the periventricular and subcortical white matter suggestive of mild chronic microvascular ischemic changes. Additional subtle subglamorality in the pons likely related to the same. 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 Recommend correlation with dedicated MRI of the pituitary/sella with and without contrast for further evaluation. No midline shift. No hydrocephalus. Normal flow voids.  ORBITS: No acute abnormality.  SINUSES AND MASTOIDS: No acute abnormality.  BONES AND SOFT TISSUES: Normal marrow signal. No acute soft tissue abnormality.  IMPRESSION: 1. 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 Recommend dedicated MRI of the pituitary/sella with and without contrast for further evaluation. 2. Mild chronic microvascular ischemic changes. 3. No acute findings.  Electronically signed by: Donnice Mania MD 07/31/2024 03:24 PM EDT RP Workstation: [id number]    *Note: Due to a large number of results and/or encounters for the requested time period, some results have not been displayed. A complete set of results can be found in Results Review.

## 2024-08-02 ENCOUNTER — Observation Stay (HOSPITAL_COMMUNITY)

## 2024-08-02 DIAGNOSIS — G43819 Other migraine, intractable, without status migrainosus: Secondary | ICD-10-CM | POA: Diagnosis not present

## 2024-08-02 DIAGNOSIS — I6782 Cerebral ischemia: Secondary | ICD-10-CM | POA: Diagnosis not present

## 2024-08-02 DIAGNOSIS — D352 Benign neoplasm of pituitary gland: Secondary | ICD-10-CM | POA: Diagnosis not present

## 2024-08-02 DIAGNOSIS — G939 Disorder of brain, unspecified: Secondary | ICD-10-CM | POA: Diagnosis not present

## 2024-08-02 DIAGNOSIS — E236 Other disorders of pituitary gland: Secondary | ICD-10-CM | POA: Diagnosis not present

## 2024-08-02 LAB — ACTH: C206 ACTH: 15.2 pg/mL (ref 7.2–63.3)

## 2024-08-02 LAB — GLUCOSE, CAPILLARY
Glucose-Capillary: 127 mg/dL — ABNORMAL HIGH (ref 70–99)
Glucose-Capillary: 131 mg/dL — ABNORMAL HIGH (ref 70–99)
Glucose-Capillary: 163 mg/dL — ABNORMAL HIGH (ref 70–99)
Glucose-Capillary: 170 mg/dL — ABNORMAL HIGH (ref 70–99)
Glucose-Capillary: 81 mg/dL (ref 70–99)

## 2024-08-02 LAB — PROLACTIN: Prolactin: 24.9 ng/mL (ref 3.6–25.2)

## 2024-08-02 LAB — RENAL FUNCTION PANEL
Albumin: 2.1 g/dL — ABNORMAL LOW (ref 3.5–5.0)
Anion gap: 21 — ABNORMAL HIGH (ref 5–15)
BUN: 79 mg/dL — ABNORMAL HIGH (ref 6–20)
CO2: 19 mmol/L — ABNORMAL LOW (ref 22–32)
Calcium: 8.1 mg/dL — ABNORMAL LOW (ref 8.9–10.3)
Chloride: 93 mmol/L — ABNORMAL LOW (ref 98–111)
Creatinine, Ser: 13.77 mg/dL — ABNORMAL HIGH (ref 0.44–1.00)
GFR, Estimated: 3 mL/min — ABNORMAL LOW (ref >60)
Glucose, Bld: 101 mg/dL — ABNORMAL HIGH (ref 70–99)
Phosphorus: 9.6 mg/dL — ABNORMAL HIGH (ref 2.5–4.6)
Potassium: 3.8 mmol/L (ref 3.5–5.1)
Sodium: 133 mmol/L — ABNORMAL LOW (ref 135–145)

## 2024-08-02 LAB — FOLLICLE STIMULATING HORMONE: FSH: 1.8 m[IU]/mL

## 2024-08-02 LAB — LUTEINIZING HORMONE: LH: <0.3 m[IU]/mL

## 2024-08-02 LAB — INSULIN-LIKE GROWTH FACTOR: Somatomedin C: 297 ng/mL — ABNORMAL HIGH (ref 60–207)

## 2024-08-02 MED ORDER — GABAPENTIN 100 MG PO CAPS
200.0000 mg | ORAL_CAPSULE | Freq: Every day | ORAL | Status: DC
Start: 2024-08-03 — End: 2024-08-04
  Administered 2024-08-03: 200 mg via ORAL
  Filled 2024-08-02: qty 2

## 2024-08-02 MED ORDER — GADOBUTROL 1 MMOL/ML IV SOLN
10.0000 mL | Freq: Once | INTRAVENOUS | Status: AC | PRN
  Administered 2024-08-02: 10 mL via INTRAVENOUS

## 2024-08-02 MED ORDER — LORAZEPAM 2 MG/ML IJ SOLN
1.0000 mg | Freq: Once | INTRAMUSCULAR | Status: AC
  Administered 2024-08-02: 1 mg via INTRAVENOUS
  Filled 2024-08-02: qty 1

## 2024-08-02 NOTE — Progress Notes (Addendum)
 PROGRESS NOTE  [name]  DOB: June 01, 1966  PCP: Gretta Comer POUR, NP FMW:[id number]  DOA: 07/30/2024  LOS: 0 days  Hospital Day: 4  Subjective: Patient was seen and examined this morning.  Lying down in bed.  Was somnolent. States he could not complete MRI because of oversedation with Valium.  Rescheduled for today Afebrile, blood pressure fluctuating in the last 24 hours between 70s and 100s. Labs from this morning with sodium 133.  Brief narrative: [name] is a 58 y.o. female with PMH significant for HIV on therapy, DM2, HTN, ESRD on PD, h/o aortic dissection s/p stent, sarcoidosis, hidradenitis, Grave's disease s/p thyroidectomy on Synthroid , prior DVT, chronic back pain 10/14, patient presented to the ED with complaint of severe headache. Patient states he went to bed in her usual state of health on 10/12, woke up next morning 10/13 feeling pain in the frontal part of her head and face.  Had associated nausea, vomited once, also complained of some blurriness of vision, trouble walking, photophobia..  She reports recently completing a course of prednisone  on 10/12. Headache worsened throughout the day, woke up in the middle of the night around 10/13 with severe headache and presented to the ED next day.  CT angio head and neck did not show any large vessel occlusion, hemodynamically significant stenosis or any other emergent finding. 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 Recommend dedicated MRI of the pituitary/sella with and without contrast for further evaluation. MRV head was unremarkable   Neurology was consulted. Admitted to [facility]. 10/16, seen by neurosurgery Dr. 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  10/16, seen by neurosurgery Dr. Rosslyn.  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 Pending MRI brain with PT protocol. Pituitary panel sent.  Noted normal levels of cortisol, LH, FSH, prolactin, TSH low but free T4 and normal IGF level elevated. ACTH level pending For headache, neurology recommended Amitriptyline 12.5mg  nightly to start for HA management x 1 week, then increase to 25mg .  HIV Reports compliance to HAART regimen Continue home Descovy  and [name] .  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  Minimal back pain at present   Leukocytosis No current evidence of infection  Blood culture did not show any growth.  Currently not on antibiotics.   Continue to monitor Recent Labs  Lab 07/30/24 2001 08/01/24 0033 08/01/24 0229  WBC 16.1* 13.2* 14.7*   Type 2 diabetes mellitus A1c 6.1 on 07/31/2024 PTA meds-saxagliptin  2.5 mg daily.  Currently on hold Continue SSI/Accu-Cheks Recent Labs  Lab 08/01/24 2117 08/01/24 2306 08/02/24 0021 08/02/24 0604 08/02/24 1247  GLUCAP 121* 75 81 127* 170*   Hypertension PTA meds- Coreg  12.5 mg twice daily, amlodipine  10 mg daily, losartan  50 mg daily, Currently heart rate and blood pressure is stable without these meds.  HLD PTA meds- aspirin , Crestor  Continue Crestor .  I would keep aspirin  on hold for the potential need of brain biopsy  Chronic macrocytic anemia Continue iron  supplement Recent Labs    07/18/24 1641 07/30/24 2001 07/30/24  2019 08/01/24 0033 08/01/24 0229  HGB 10.9* 10.8* 11.9* 10.0* 10.2*  MCV  --  110.0*  --  108.8* 110.2*   Hypothyroidism H/o thyroidectomy for Graves' disease Continue Synthroid    Chronic  gout Cont allopurinol    H/o sarcoidosis   Hidradenitis suppurativa On doxycycline  chronically  Minocycline   Prior DVT Not on chronic anticoagulation   Chronic back pain PTA meds- Tylenol  PRN, Norco PRN, Lidoderm  patch PRN, Robaxin  as needed.  Continue all.   Depression Continue BuSpar     Mobility: Encourage ambulation PT Orders: Active   PT Follow up Rec: Home Health Pt10/16/2025 1055   Goals of care   Code Status: Full Code     DVT prophylaxis:  heparin  injection 5,000 Units Start: 07/31/24 2245   Antimicrobials: HAART Fluid: None Consultants: Nephrology, neurology, neurosurgery Family Communication: Daughter not at bedside today  Status: Observation Level of care:  Telemetry Medical   Patient is from: Home Needs to continue in-hospital care: Ongoing workup.  Pending MRI Anticipated d/c to: Pending clinical course, eventually home likely in 1 to 2 days      Diet:  Diet Order             Diet renal with fluid restriction Fluid restriction: 1200 mL Fluid; Room service appropriate? Yes; Fluid consistency: Thin  Diet effective now                   Scheduled Meds:  allopurinol   100 mg Oral Daily   amitriptyline  12.5 mg Oral QHS   budesonide  (PULMICORT ) nebulizer solution  0.25 mg Nebulization BID   busPIRone   5 mg Oral BID   dolutegravir   50 mg Oral Daily   emtricitabine -tenofovir  AF  1 tablet Oral Daily   ferric citrate   210 mg Oral TID WC   [START ON 08/03/2024] gabapentin   200 mg Oral QHS   gentamicin  cream  1 Application Topical Daily   heparin   5,000 Units Subcutaneous Q8H   insulin  aspart  0-6 Units Subcutaneous TID WC   levothyroxine   150 mcg Oral QAC breakfast   LORazepam   1 mg Intravenous Once   minocycline   100 mg Oral BID   pantoprazole   40 mg Oral Daily   rosuvastatin   10 mg Oral Daily    PRN meds: acetaminophen , albuterol , dextrose , heparin  sodium (porcine) 3,000 Units in dialysis solution 1.5% low-MG/low-CA 6,000 mL  dialysis solution, lidocaine , methocarbamol , metoCLOPramide  (REGLAN ) injection, prochlorperazine    Infusions:   dialysis solution 1.5% low-MG/low-CA      Antimicrobials: Anti-infectives (From admission, onward)    Start     Dose/Rate Route Frequency Ordered Stop   08/01/24 1200  dolutegravir  ([name] ) tablet 50 mg        50 mg Oral Daily 08/01/24 1108     07/31/24 1000  dolutegravir  ([name] ) tablet 50 mg  Status:  Discontinued        50 mg Oral Daily 07/31/24 0921 07/31/24 0923   07/31/24 1000  emtricitabine -tenofovir  AF (DESCOVY ) 200-25 MG per tablet 1 tablet        1 tablet Oral Daily 07/31/24 0921     07/31/24 1000  minocycline  (MINOCIN ) capsule 100 mg        100 mg Oral 2 times daily 07/31/24 0921         Objective: Vitals:   08/02/24 1214 08/02/24 1215  BP: (!) 96/41 (!) (P) 93/51  Pulse: (!) 103 (!) (P) 102  Resp: 20 (P) 20  Temp: 99.9 F (37.7 C) (P) 98.2 F (  36.8 C)  SpO2: 100% (P) 100%   No intake or output data in the 24 hours ending 08/02/24 1500 Filed Weights   07/31/24 1748 08/01/24 0500 08/02/24 0500  Weight: 82.3 kg 86 kg 91.5 kg   Weight change: 9.2 kg Body mass index is 33.57 kg/m.   Physical Exam: General exam: Pleasant, middle-aged African-American female.  Not in distress currently Skin: No rashes, lesions or ulcers. HEENT: Atraumatic, normocephalic, no obvious bleeding Lungs: Clear to auscultation bilaterally,  CVS: S1, S2, no murmur,   GI/Abd: Soft, nontender, nondistended, bowel sound present,   CNS: Looked partially sedated.SABRA Psychiatry: Mood appropriate Extremities: No pedal edema, no calf tenderness,   Data Review: I have personally reviewed the laboratory data and studies available.  F/u labs ordered Unresulted Labs (From admission, onward)     Start     Ordered   08/01/24 1426  ACTH  Once,   R       Question:  Specimen collection method  Answer:  Lab=Lab collect   08/01/24 1425            Signed, Chapman Rota,  MD [facility] 08/02/2024

## 2024-08-02 NOTE — Progress Notes (Signed)
   08/02/24 1438  Completion  Treatment Status Complete  Initial Drain Volume 5  Average Dwell Time-Hour(s) 1  Average Dwell Time-Min(s) 30  Average Drain Time 82  Total Therapy Volume 86499  Total Therapy Time-Hour(s) 19  Total Therapy Time-Min(s) 50  Weight after Drain 184 lb 1.4 oz (83.5 kg)  Effluent Appearance Clear;Amber;Light  Fluid Balance - CCPD  Total UF (- value on cycler, pt gain) -444 mL  Procedure Comments  Tolerated treatment well? Yes  Peritoneal Dialysis Comments Pt. voice no questions or complaints with sister at bedside.  Education / Care Plan  Dialysis Education Provided Yes  Hand-off documentation  Hand-off Given Given to shift RN/LPN  Report given to (Full Name) Dena Innocent, RN  Hand-off Received Received from shift RN/LPN  Report received from (Full Name) Zebedee Mace, RN   PD post treatment note  PD treatment completed. Patient tolerated treatment well. PD effluent is clear. No specimen collected.  PD exit site clean, dry and intact. Patient is awake, oriented and in no acute distress.  Report given to bedside nurse.   Post treatment VS: 98.5, 68, 99/66, map (78), 20, Spo2 98% RA  Total UF removed: See Above Grid  Post treatment weight: 83.6 kg

## 2024-08-02 NOTE — Progress Notes (Signed)
 Physical Therapy Treatment Patient Details Name: [name] MRN: [id number] DOB: 07/29/1966 Today's Date: 08/02/2024   History of Present Illness 58 y.o. female presents to [facility] 07/30/24 with a headache, blurry vision, nausea and trouble walking. 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 PMHx: HIV on therapy, HTN, DM, Grave's disease, prior DVT, aortic dissection status post-repair, ESRD on peritoneal dialysis    PT Comments  Pt received in supine and agreeable to PT session. Pt was able to improve in today's session with increased gait distance and stair trial. Pt was able to ambulate 234ft with RW and CGA. Pt was also able to ascend/descend 6 steps with bilateral handrails. Cued to ascend with R LE leading and descend with L LE due to reports of L ankle pain. Also discussed proper guarding from family when negotiating steps at home with pt verbalizing understanding. When returning to the room, pt started veering right/left with pt attributing gait pattern to an increase in h/a pain. Returned safely to supine with lights dimmed and all needs met. Continue to follow acutely with HHPT upon d/c home.   Supine BP- 100/55 (63), 90 BPM    If plan is discharge home, recommend the following: A lot of help with walking and/or transfers;Assist for transportation;Help with stairs or ramp for entrance   Can travel by private vehicle      Yes  Equipment Recommendations  Rolling walker (2 wheels)       Precautions / Restrictions Precautions Precautions: Fall Recall of Precautions/Restrictions: Intact Restrictions Weight Bearing Restrictions Per Provider Order: No     Mobility  Bed Mobility Overal bed mobility: Modified Independent     Transfers Overall transfer level: Needs assistance Equipment used: Rolling walker (2 wheels) Transfers: Sit to/from Stand Sit to Stand: Supervision    General transfer comment: supervision for safety     Ambulation/Gait Ambulation/Gait assistance: Contact guard assist Gait Distance (Feet): 200 Feet Assistive device: Rolling walker (2 wheels) Gait Pattern/deviations: Step-through pattern, Decreased stride length Gait velocity: decr    General Gait Details: heavy reliance on UE support, veering right/left nearing end of gait distance with pt attributing veering due to increase in h/a   Stairs Stairs: Yes Stairs assistance: Contact guard assist Stair Management: One rail Right, One rail Left, Step to pattern, Forwards Number of Stairs: 6 General stair comments: reliance on UE support with cues to ascend with R LE and descend with  L LE to account for L ankle pain    Balance Overall balance assessment: Needs assistance Sitting-balance support: No upper extremity supported, Feet supported Sitting balance-Leahy Scale: Good     Standing balance support: Bilateral upper extremity supported, During functional activity, Reliant on assistive device for balance Standing balance-Leahy Scale: Poor Standing balance comment: reliant on UE support       Communication Communication Communication: No apparent difficulties  Cognition Arousal: Alert Behavior During Therapy: WFL for tasks assessed/performed   PT - Cognitive impairments: No apparent impairments    Following commands: Intact      Cueing Cueing Techniques: Verbal cues, Tactile cues         Pertinent Vitals/Pain Pain Assessment Pain Assessment: Faces Faces Pain Scale: Hurts even more Pain Location: headache, L ankle Pain Descriptors / Indicators: Headache, Burning Pain Intervention(s): Limited activity within patient's tolerance, Monitored during session, Repositioned     PT Goals (current goals can now be found in the care plan section) Acute Rehab PT Goals Patient Stated Goal: to feel better PT  Goal Formulation: With patient Time For Goal Achievement: 08/15/24 Potential to Achieve Goals: Good Progress towards PT  goals: Progressing toward goals    Frequency    Min 2X/week       AM-PAC PT 6 Clicks Mobility   Outcome Measure  Help needed turning from your back to your side while in a flat bed without using bedrails?: None Help needed moving from lying on your back to sitting on the side of a flat bed without using bedrails?: None Help needed moving to and from a bed to a chair (including a wheelchair)?: A Little Help needed standing up from a chair using your arms (e.g., wheelchair or bedside chair)?: A Little Help needed to walk in hospital room?: A Little Help needed climbing 3-5 steps with a railing? : A Lot 6 Click Score: 19    End of Session Equipment Utilized During Treatment: Gait belt Activity Tolerance: Patient tolerated treatment well Patient left: in bed;with call bell/phone within reach;with family/visitor present Nurse Communication: Mobility status PT Visit Diagnosis: Unsteadiness on feet (R26.81);Other abnormalities of gait and mobility (R26.89);Muscle weakness (generalized) (M62.81)     Time: 8362-8345 PT Time Calculation (min) (ACUTE ONLY): 17 min  Charges:    $Gait Training: 8-22 mins PT General Charges $$ ACUTE PT VISIT: 1 Visit                    [name], PT, DPT Secure Chat Preferred  Rehab Office [phone]   [name] 08/02/2024, 5:11 PM

## 2024-08-02 NOTE — Progress Notes (Signed)
 [facility] Progress Note   Subjective:    Seen and examined patient at bedside. She reports her headaches are improving. Remains on PD and reports tolerating treatment. Per patient, went for MRI last night but didn't tolerate procedure 2nd anxiety; thus, diagnostic was aborted. She reports getting valium which made her very drowsy. Appears they will try doing the MRI again today. Plan for PD again tonight. Asked bedside RN to obtain a standing weight today.  Objective Vitals:   08/02/24 0753 08/02/24 0822 08/02/24 1214 08/02/24 1215  BP: (!) (P) 93/49  (!) 96/41 (!) (P) 93/51  Pulse: (P) 97 97 (!) 103 (!) (P) 102  Resp: (P) 20 16 20  (P) 20  Temp: (P) 99.5 F (37.5 C)  99.9 F (37.7 C) (P) 98.2 F (36.8 C)  TempSrc: (P) Axillary  Oral (P) Oral  SpO2: (P) 95% 98% 100% (P) 100%  Weight:      Height:       Physical Exam General: Awake, alert, on RA, NAD Head: Sclera not icteric  Lungs: CTA bilaterally. No wheeze, rales or rhonchi. Breathing is unlabored. Heart: RRR. No murmur, rubs or gallops.  Abdomen: soft and non-tender  Neuro: AAOx3. Moves all extremities spontaneously. Dialysis Access: PD cather LUQ  Filed Weights   07/31/24 1748 08/01/24 0500 08/02/24 0500  Weight: 82.3 kg 86 kg 91.5 kg   No intake or output data in the 24 hours ending 08/02/24 1431  Additional Objective Labs: Basic Metabolic Panel: Recent Labs  Lab 07/30/24 2011 07/30/24 2019 08/01/24 0229 08/02/24 0525  NA 136 136 135 133*  K 4.8 4.5 3.7 3.8  CL 93* 97* 95* 93*  CO2 23  --  20* 19*  GLUCOSE 73 72 106* 101*  BUN 79* 78* 81* 79*  CREATININE 13.88* 13.60* 14.10* 13.77*  CALCIUM  8.3*  --  8.2* 8.1*  PHOS  --   --   --  9.6*   Liver Function Tests: Recent Labs  Lab 07/30/24 2011 08/01/24 0229 08/02/24 0525  AST 62* 58*  --   ALT 90* 70*  --   ALKPHOS 157* 153*  --   BILITOT 0.6 0.7  --   PROT 6.4* 5.5*  --   ALBUMIN  2.6* 2.2* 2.1*   No results for input(s):  LIPASE, AMYLASE in the last 168 hours. CBC: Recent Labs  Lab 07/30/24 2001 07/30/24 2019 08/01/24 0033 08/01/24 0229  WBC 16.1*  --  13.2* 14.7*  NEUTROABS 13.0*  --   --   --   HGB 10.8* 11.9* 10.0* 10.2*  HCT 31.9* 35.0* 29.7* 30.3*  MCV 110.0*  --  108.8* 110.2*  PLT 200  --  164 168   Blood Culture    Component Value Date/Time   SDES BLOOD RIGHT HAND 07/31/2024 1929   SPECREQUEST  07/31/2024 1929    BOTTLES DRAWN AEROBIC AND ANAEROBIC Blood Culture adequate volume   CULT  07/31/2024 1929    NO GROWTH 2 DAYS Performed at [facility], [address] [place], [place] [address]    REPTSTATUS PENDING 07/31/2024 1929    Cardiac Enzymes: No results for input(s): CKTOTAL, CKMB, CKMBINDEX, TROPONINI in the last 168 hours. CBG: Recent Labs  Lab 08/01/24 2117 08/01/24 2306 08/02/24 0021 08/02/24 0604 08/02/24 1247  GLUCAP 121* 75 81 127* 170*   Iron  Studies: No results for input(s): IRON , TIBC, TRANSFERRIN, FERRITIN in the last 72 hours. Lab Results  Component Value Date   INR 1.4 (H) 08/29/2023  INR 1.4 (H) 08/28/2023   INR 1.3 (H) 08/28/2023   Studies/Results: MR BRAIN WO CONTRAST Result Date: 07/31/2024 EXAM: MRI BRAIN WITHOUT CONTRAST 07/31/2024 03:13:51 PM TECHNIQUE: Multiplanar multisequence MRI of the head/brain was performed without the administration of intravenous contrast. COMPARISON: Same day MRV head and CTA head 07/30/2024. CLINICAL HISTORY: Headache, new onset (Age >= 51y). Headache x5 days with neck stiffness. FINDINGS: BRAIN AND VENTRICLES: No acute infarct. No acute intracranial hemorrhage. Chronic microhemorrhages are present in the left frontal lobe and right parietal lobe. Scattered T2 FLAIR hyperintensity in the periventricular and subcortical white matter suggestive of mild chronic microvascular ischemic changes. Additional subtle subglamorality in the pons likely related to the same. There is a possible sellar mass  extending into the suprasellar cistern which measures up to 1.6 cm in craniocaudal dimension. Recommend correlation with dedicated MRI of the pituitary/sella with and without contrast for further evaluation. No midline shift. No hydrocephalus. Normal flow voids. ORBITS: No acute abnormality. SINUSES AND MASTOIDS: No acute abnormality. BONES AND SOFT TISSUES: Normal marrow signal. No acute soft tissue abnormality. IMPRESSION: 1. 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 Recommend dedicated MRI of the pituitary/sella with and without contrast for further evaluation. 2. Mild chronic microvascular ischemic changes. 3. No acute findings. Electronically signed by: Donnice Mania MD 07/31/2024 03:24 PM EDT RP Workstation: [id number]    Medications:  dialysis solution 1.5% low-MG/low-CA      allopurinol   100 mg Oral Daily   amitriptyline  12.5 mg Oral QHS   budesonide  (PULMICORT ) nebulizer solution  0.25 mg Nebulization BID   busPIRone   5 mg Oral BID   dolutegravir   50 mg Oral Daily   emtricitabine -tenofovir  AF  1 tablet Oral Daily   ferric citrate   210 mg Oral TID WC   gabapentin   300 mg Oral TID   gentamicin  cream  1 Application Topical Daily   heparin   5,000 Units Subcutaneous Q8H   insulin  aspart  0-6 Units Subcutaneous TID WC   levothyroxine   150 mcg Oral QAC breakfast   LORazepam   1 mg Intravenous Once   minocycline   100 mg Oral BID   pantoprazole   40 mg Oral Daily   rosuvastatin   10 mg Oral Daily    Dialysis Orders: CCPD 7x week-[facility] Primary Nephrologist Dr. 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 CT neck/head and MRV stable. 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 MRI w/contrast ordered. Neurology and Neurosurgery consulted. ESRD -  on PD, compliant with treatment  with no issues at home. Tolerating PD overnight. Plan for PD tonight and will raise to 6 exchanges for the next 2 nights to enhance clearance since she will be getting contrast. Asked bedside RN to obtain a standing weight. Likely will continue using all 1.5% bags tonight 2nd current low Bps. Hypertension/volume  - Blood pressures are trending down, likely 2nd to possible adrenal issues. Not on any BP meds. Hormonal panel ordered. Per primary. Anemia of CKD - Hgb trending down, now 10.2. Fe/ESA is not indicated at this time. Follow trend closely Secondary Hyperparathyroidism -  Corr Ca ok, checking phos in AM Nutrition - Renal diet with fluid restriction  Charmaine Piety, NP [facility] 08/02/2024,2:31 PM  LOS: 0 days

## 2024-08-02 NOTE — Plan of Care (Signed)
  Problem: Education: Goal: Ability to describe self-care measures that may prevent or decrease complications (Diabetes Survival Skills Education) will improve Outcome: Progressing   Problem: Coping: Goal: Ability to adjust to condition or change in health will improve Outcome: Progressing   Problem: Health Behavior/Discharge Planning: Goal: Ability to identify and utilize available resources and services will improve Outcome: Progressing

## 2024-08-02 NOTE — Plan of Care (Signed)
  Problem: Fluid Volume: Goal: Ability to maintain a balanced intake and output will improve Outcome: Progressing   Problem: Coping: Goal: Ability to adjust to condition or change in health will improve Outcome: Progressing   Problem: Clinical Measurements: Goal: Ability to maintain clinical measurements within normal limits will improve Outcome: Progressing Goal: Will remain free from infection Outcome: Progressing Goal: Diagnostic test results will improve Outcome: Progressing Goal: Respiratory complications will improve Outcome: Progressing Goal: Cardiovascular complication will be avoided Outcome: Progressing

## 2024-08-02 NOTE — Progress Notes (Signed)
 Pt receives PD care through [facility] per renal notes. Will assist as needed.   Randine Mungo Dialysis Navigator [phone]

## 2024-08-02 NOTE — Progress Notes (Signed)
 Pt back from MRI. Very drowsy, but still able to arouse. VSS.

## 2024-08-02 NOTE — Progress Notes (Signed)
 Ok to reduce gabapentin  to 200mg  qday due to her ESRD per Dr. Arlice.  Sergio Batch, PharmD, BCIDP, AAHIVP, CPP Infectious Disease Pharmacist 08/02/2024 2:45 PM

## 2024-08-03 DIAGNOSIS — E236 Other disorders of pituitary gland: Secondary | ICD-10-CM

## 2024-08-03 DIAGNOSIS — G43819 Other migraine, intractable, without status migrainosus: Secondary | ICD-10-CM | POA: Diagnosis not present

## 2024-08-03 LAB — GLUCOSE, CAPILLARY
Glucose-Capillary: 135 mg/dL — ABNORMAL HIGH (ref 70–99)
Glucose-Capillary: 116 mg/dL — ABNORMAL HIGH (ref 70–99)
Glucose-Capillary: 128 mg/dL — ABNORMAL HIGH (ref 70–99)
Glucose-Capillary: 245 mg/dL — ABNORMAL HIGH (ref 70–99)

## 2024-08-03 MED ORDER — FENTANYL CITRATE (PF) 50 MCG/ML IJ SOSY
12.5000 ug | PREFILLED_SYRINGE | Freq: Four times a day (QID) | INTRAMUSCULAR | Status: DC | PRN
Start: 2024-08-03 — End: 2024-08-04
  Administered 2024-08-03: 12.5 ug via INTRAVENOUS
  Filled 2024-08-03: qty 1

## 2024-08-03 MED ORDER — POLYETHYLENE GLYCOL 3350 17 G PO PACK
17.0000 g | PACK | Freq: Every day | ORAL | Status: DC | PRN
  Administered 2024-08-04: 17 g via ORAL
  Filled 2024-08-03 (×2): qty 1

## 2024-08-03 MED ORDER — KETOROLAC TROMETHAMINE 15 MG/ML IJ SOLN: 15.0000 mg | Freq: Three times a day (TID) | INTRAMUSCULAR | Status: DC | PRN

## 2024-08-03 MED ORDER — PREDNISONE 5 MG PO TABS
10.0000 mg | ORAL_TABLET | Freq: Every day | ORAL | Status: DC
  Administered 2024-08-03 – 2024-08-04 (×2): 10 mg via ORAL
  Filled 2024-08-03 (×2): qty 2

## 2024-08-03 MED ORDER — SENNOSIDES-DOCUSATE SODIUM 8.6-50 MG PO TABS
1.0000 | ORAL_TABLET | Freq: Every day | ORAL | Status: DC
  Filled 2024-08-03: qty 1

## 2024-08-03 MED ORDER — FERRIC CITRATE 1 GM 210 MG(FE) PO TABS
420.0000 mg | ORAL_TABLET | Freq: Three times a day (TID) | ORAL | Status: DC
  Filled 2024-08-03 (×4): qty 2

## 2024-08-03 NOTE — Progress Notes (Signed)
 [facility] Progress Note   Subjective:   Reports tremor in b/l hands, R>L for the last 3 days.  Denies chest pain, shortness of breath, abdominal pain and nausea/vomiting/diarrhea. MRI completed yesterday prior to PD.    Objective Vitals:   08/03/24 0428 08/03/24 0721 08/03/24 0811 08/03/24 1139  BP: 113/72  114/64 (P) 103/62  Pulse: 100  (!) 103 (P) 93  Resp: 18  20 (P) 20  Temp: 99.3 F (37.4 C)  97.8 F (36.6 C) (P) 98 F (36.7 C)  TempSrc: Oral  Oral (P) Oral  SpO2: 98% 94% 94% (P) 100%  Weight:      Height:       Physical Exam General:chronically ill appearing female in NAD, tremor in R hand Heart:RRR, no mrg Lungs:CTAB, nml WOB on RA Abdomen:soft, NTND Extremities:no LE edema Dialysis Access: PD catheter in LUQ   Filed Weights   08/01/24 0500 08/02/24 0500 08/02/24 1440  Weight: 86 kg 91.5 kg 83.6 kg    Intake/Output Summary (Last 24 hours) at 08/03/2024 1501 Last data filed at 08/03/2024 1016 Gross per 24 hour  Intake --  Output 442 ml  Net -442 ml    Additional Objective Labs: Basic Metabolic Panel: Recent Labs  Lab 07/30/24 2011 07/30/24 2019 08/01/24 0229 08/02/24 0525  NA 136 136 135 133*  K 4.8 4.5 3.7 3.8  CL 93* 97* 95* 93*  CO2 23  --  20* 19*  GLUCOSE 73 72 106* 101*  BUN 79* 78* 81* 79*  CREATININE 13.88* 13.60* 14.10* 13.77*  CALCIUM  8.3*  --  8.2* 8.1*  PHOS  --   --   --  9.6*   Liver Function Tests: Recent Labs  Lab 07/30/24 2011 08/01/24 0229 08/02/24 0525  AST 62* 58*  --   ALT 90* 70*  --   ALKPHOS 157* 153*  --   BILITOT 0.6 0.7  --   PROT 6.4* 5.5*  --   ALBUMIN  2.6* 2.2* 2.1*   No results for input(s): LIPASE, AMYLASE in the last 168 hours. CBC: Recent Labs  Lab 07/30/24 2001 07/30/24 2019 08/01/24 0033 08/01/24 0229  WBC 16.1*  --  13.2* 14.7*  NEUTROABS 13.0*  --   --   --   HGB 10.8* 11.9* 10.0* 10.2*  HCT 31.9* 35.0* 29.7* 30.3*  MCV 110.0*  --  108.8* 110.2*  PLT 200  --  164  168   Blood Culture    Component Value Date/Time   SDES BLOOD RIGHT HAND 07/31/2024 1929   SPECREQUEST  07/31/2024 1929    BOTTLES DRAWN AEROBIC AND ANAEROBIC Blood Culture adequate volume   CULT  07/31/2024 1929    NO GROWTH 3 DAYS Performed at [facility], [address] [place], [place] [address]    REPTSTATUS PENDING 07/31/2024 1929  REsults: MR BRAIN W WO CONTRAST Result Date: 08/02/2024 EXAM: MRI BRAIN WITH AND WITHOUT CONTRAST 08/02/2024 06:04:53 PM TECHNIQUE: Multiplanar multisequence MRI of the head/brain was performed with and without the administration of intravenous contrast. Examination moderately degraded by motion artifact. COMPARISON: Prior MRI from 07/31/2024. CLINICAL HISTORY: Pituitary tumor. 10mL Gadavist  Pituitary tumor; Technically difficult due to patient motion. FINDINGS: BRAIN AND VENTRICLES: Cerebral volume within normal limits. Patchy T2 FLAIR hyperintensity involving the periventricular and deep white matter of multiple cerebral hemispheres, most characteristic of chronic small vessel ischemic disease. Patchy involvement of the pons noted. Few small chronic microhemorrhages noted, likely small vessel/hypertensive in nature. No acute infarct. No  acute intracranial hemorrhage. No mass effect or midline shift from the brain parenchyma. No hydrocephalus. In the sellar/suprasellar region, again seen is an expansile suprasellar mass corresponding with abnormality on prior MRI. Lesion measures approximately 2.0 x 1.3 x 1.5 cm (craniocaudad x AP x transverse). Lesion demonstrates increased precontrast T1 signal intensity as compared to previous exam. Areas of heterogeneity T2/FLAIR signal abnormality seen centrally. Associated susceptibility artifact noted (series 6, image 32). Evaluation for associated enhancement limited by the precontrast T1 signal. 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 Mass effect on the optic chiasm, which is slightly displaced superiorly, worse on the left (series 11, image 10). No overt cavernous sinus invasion. Normal flow voids. No mass or abnormal enhancement within the brain parenchyma. ORBITS: No acute abnormality. SINUSES: No acute abnormality. BONES AND SOFT TISSUES: Normal bone marrow signal and enhancement. No acute soft tissue abnormality. IMPRESSION: 1. 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 Secondary mass effect on the optic chiasm, worse on the left, but no overt cavernous sinus invasion. 2. Underlying mild chronic microvascular ischemic disease. Electronically signed by: Morene Hoard MD 08/02/2024 06:52 PM EDT RP Workstation: [id number]    Medications:  dialysis solution 1.5% low-MG/low-CA      allopurinol   100 mg Oral Daily   amitriptyline  12.5 mg Oral QHS   budesonide  (PULMICORT ) nebulizer solution  0.25 mg Nebulization BID   busPIRone   5 mg Oral BID   dolutegravir   50 mg Oral Daily   emtricitabine -tenofovir  AF  1 tablet Oral Daily   ferric citrate   210 mg Oral TID WC   gabapentin   200 mg Oral QHS   gentamicin  cream  1 Application Topical Daily   heparin   5,000 Units Subcutaneous Q8H   insulin  aspart  0-6 Units Subcutaneous TID WC   levothyroxine   150 mcg Oral QAC breakfast   minocycline   100 mg Oral BID   pantoprazole   40 mg Oral Daily   rosuvastatin   10 mg Oral Daily   senna-docusate  1 tablet Oral QHS    Dialysis Orders: CCPD 7x week-[facility] Primary Nephrologist Dr. Norine 5 exchanges EDW 79.5kg Fill volume  1.5hrs dwell time 1/2 1.5% bags; 1/2 2.5% bags (per patient)   Assessment/Plan: Pituitary mass - likely contributing to new onset headaches. CT neck/head and MRV stable. 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 Secondary mass effect on optic chiasm, worse on L, but no overt sinus invasion. Neurology and Neurosurgery consulted. Tremor - new onset, etiology unclear.  ESRD -  on PD, compliant with treatment with no issues at home. Tolerating PD overnight. Plan for PD again tonight increased to 6 exchanges to help enhance clearance of MRI contrast. Asked bedside RN to obtain a standing weight. Plan to use all 1.5% bags tonight.  Hypertension/volume  - Blood pressures are trending down, likely 2nd to possible adrenal issues. Not on any BP meds. Hormonal panel ordered. Per primary. Anemia of CKD - Hgb trending down, now 10.2. Fe/ESA is not indicated at this time. Follow trend closely. Not on ESA. Secondary Hyperparathyroidism -  Corr Ca ok, Phos elevated.  On auryxia , increase to 2 AC TID. Nutrition - Renal diet with fluid restriction  Manuelita Labella, PA-C [facility] 08/03/2024,3:01 PM  LOS: 0 days

## 2024-08-03 NOTE — Plan of Care (Signed)

## 2024-08-03 NOTE — Progress Notes (Signed)
 PROGRESS NOTE  [name]  DOB: 03-Dec-1965  PCP: Gretta Comer POUR, NP FMW:[id number]  DOA: 07/30/2024  LOS: 0 days  Hospital Day: 5  Subjective: Patient was seen and examined this morning. Lying down in bed.  Still feels drugged with the effect of Valium and Ativan  from yesterday.  Also reports tremor in both hands for the last 3 days. I also spoke to her sisters on the phone from bedside. Afebrile, hemodynamically stable with blood pressure mostly in low normal range, breathing on room air  Brief narrative: [name] is a 58 y.o. female with PMH significant for HIV on therapy, DM2, HTN, ESRD on PD, h/o aortic dissection s/p stent, sarcoidosis, hidradenitis, Grave's disease s/p thyroidectomy on Synthroid , prior DVT, chronic back pain 10/14, patient presented to the ED with complaint of severe headache. Patient states he went to bed in her usual state of health on 10/12, woke up next morning 10/13 feeling pain in the frontal part of her head and face.  Had associated nausea, vomited once, also complained of some blurriness of vision, trouble walking, photophobia..  She reports recently completing a course of prednisone  on 10/12. Headache worsened throughout the day, woke up in the middle of the night around 10/13 with severe headache and presented to the ED next day.  CT angio head and neck did not show any large vessel occlusion, hemodynamically significant stenosis or any other emergent finding. 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 Recommend dedicated MRI of the pituitary/sella with and without contrast for further evaluation. MRV head was unremarkable   Neurology was consulted. Admitted to [facility]. 10/16, seen by neurosurgery Dr. Rosslyn 10/17, MRI brain with pituitary protocol showed an expansile sellar/suprasellar mass measuring 2.0 x 1.3 x 1.5 cm, favored to reflect a pituitary  macroadenoma. 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 Secondary mass effect on the optic chiasm, worse on the left, but no overt cavernous sinus invasion.  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  10/16, seen by neurosurgery Dr. Rosslyn.  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 Pending MRI brain with PT protocol. Pituitary panel sent.  Noted normal levels of cortisol, LH, FSH, ACTH Prolactin level at 24.9 which is in the higher side of normal range (3.6-25.2) TSH low but free T4 and normal IGF level elevated. 10/18, discussed with Dr. Janjua.  Recommended Medrol  dose pack and follow-up as an outpatient. For headache, neurology recommended Amitriptyline 12.5mg  nightly to start for HA management x 1 week, then increase to 25mg . Headache seems to be improving  Tremors Reports tremors in both hands worse since yesterday. Etiology unclear.  Rhythm monitor.  HIV Reports compliance to HAART regimen Continue home Descovy  and [name] .  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  Minimal back pain at present   Leukocytosis No current evidence of infection  Blood culture did not show any growth.  Currently not on antibiotics.   Continue to monitor Recent Labs  Lab 07/30/24 2001 08/01/24 0033 08/01/24 0229  WBC 16.1* 13.2* 14.7*   Type 2  diabetes mellitus A1c 6.1 on 07/31/2024 PTA meds-saxagliptin  2.5 mg daily.  Currently on hold Continue SSI/Accu-Cheks Recent Labs  Lab 08/02/24 1247 08/02/24 1643 08/02/24 2052  08/03/24 0620 08/03/24 1138  GLUCAP 170* 163* 131* 135* 116*   Hypertension PTA meds- Coreg  12.5 mg twice daily, amlodipine  10 mg daily, losartan  50 mg daily, Currently heart rate and blood pressure is stable without these meds.  HLD PTA meds- aspirin , Crestor  Continue Crestor .  I would keep aspirin  on hold for the potential need of brain biopsy  Chronic macrocytic anemia Continue iron  supplement Recent Labs    07/18/24 1641 07/30/24 2001 07/30/24 2019 08/01/24 0033 08/01/24 0229  HGB 10.9* 10.8* 11.9* 10.0* 10.2*  MCV  --  110.0*  --  108.8* 110.2*   Hypothyroidism H/o thyroidectomy for Graves' disease Continue Synthroid    Chronic gout Cont allopurinol    H/o sarcoidosis   Hidradenitis suppurativa On doxycycline  chronically  Minocycline ??  Prior DVT Not on chronic anticoagulation   Chronic back pain PTA meds- Tylenol  PRN, Norco PRN, Lidoderm  patch PRN, Robaxin  as needed.  Continue all.   Depression Continue BuSpar     Mobility: Encourage ambulation PT Orders: Active   PT Follow up Rec: Home Health Pt10/17/2025 1703   Goals of care   Code Status: Full Code     DVT prophylaxis:  heparin  injection 5,000 Units Start: 07/31/24 2245   Antimicrobials: HAART Fluid: None Consultants: Nephrology, neurology, neurosurgery Family Communication: I called and updated patient's sisters on the phone today  Status: Observation Level of care:  Telemetry Medical   Patient is from: Home Needs to continue in-hospital care: Tremors in both hands today.  Monitor for next 24 hours prior to discharge      Diet:  Diet Order             Diet renal with fluid restriction Fluid restriction: 1200 mL Fluid; Room service appropriate? Yes; Fluid consistency: Thin  Diet effective now                   Scheduled Meds:  allopurinol   100 mg Oral Daily   amitriptyline  12.5 mg Oral QHS   budesonide  (PULMICORT ) nebulizer solution  0.25 mg Nebulization BID    busPIRone   5 mg Oral BID   dolutegravir   50 mg Oral Daily   emtricitabine -tenofovir  AF  1 tablet Oral Daily   ferric citrate   420 mg Oral TID WC   gabapentin   200 mg Oral QHS   gentamicin  cream  1 Application Topical Daily   heparin   5,000 Units Subcutaneous Q8H   insulin  aspart  0-6 Units Subcutaneous TID WC   levothyroxine   150 mcg Oral QAC breakfast   minocycline   100 mg Oral BID   pantoprazole   40 mg Oral Daily   predniSONE   10 mg Oral Q breakfast   rosuvastatin   10 mg Oral Daily   senna-docusate  1 tablet Oral QHS    PRN meds: acetaminophen , albuterol , dextrose , heparin  sodium (porcine) 3,000 Units in dialysis solution 1.5% low-MG/low-CA 6,000 mL dialysis solution, lidocaine , methocarbamol , metoCLOPramide  (REGLAN ) injection, polyethylene glycol, prochlorperazine    Infusions:   dialysis solution 1.5% low-MG/low-CA      Antimicrobials: Anti-infectives (From admission, onward)    Start     Dose/Rate Route Frequency Ordered Stop   08/01/24 1200  dolutegravir  ([name] ) tablet 50 mg        50 mg Oral Daily 08/01/24 1108     07/31/24 1000  dolutegravir  ([name] ) tablet 50 mg  Status:  Discontinued        50 mg Oral Daily 07/31/24 0921 07/31/24 0923   07/31/24 1000  emtricitabine -tenofovir  AF (DESCOVY ) 200-25 MG per tablet 1 tablet        1 tablet Oral Daily 07/31/24 0921     07/31/24 1000  minocycline  (MINOCIN ) capsule 100 mg        100 mg Oral 2 times daily 07/31/24 0921         Objective: Vitals:   08/03/24 0811 08/03/24 1139  BP: 114/64 (P) 103/62  Pulse: (!) 103 (P) 93  Resp: 20 (P) 20  Temp: 97.8 F (36.6 C) (P) 98 F (36.7 C)  SpO2: 94% (P) 100%    Intake/Output Summary (Last 24 hours) at 08/03/2024 1519 Last data filed at 08/03/2024 1016 Gross per 24 hour  Intake --  Output 442 ml  Net -442 ml   Filed Weights   08/01/24 0500 08/02/24 0500 08/02/24 1440  Weight: 86 kg 91.5 kg 83.6 kg   Weight change: -7.9 kg Body mass index is 30.67 kg/m.    Physical Exam: General exam: Pleasant, middle-aged African-American female.  Not in distress currently Skin: No rashes, lesions or ulcers. HEENT: Atraumatic, normocephalic, no obvious bleeding Lungs: Clear to auscultation bilaterally,  CVS: S1, S2, no murmur,   GI/Abd: Soft, nontender, nondistended, bowel sound present,   CNS: Mental status improving.  Mild intentional tremors bilaterally Psychiatry: Mood appropriate Extremities: No pedal edema, no calf tenderness,   Data Review: I have personally reviewed the laboratory data and studies available.  F/u labs ordered Unresulted Labs (From admission, onward)    None       Signed, Chapman Rota, MD [facility] 08/03/2024

## 2024-08-03 NOTE — Progress Notes (Signed)
 Pt daughter was letting us  know that pt was having short term memory loss which is different from pt baseline. She wants to talk to doctors directly about POC etc. for day team. Provider notified to pass this info. Along so day team can call her.

## 2024-08-04 ENCOUNTER — Other Ambulatory Visit (HOSPITAL_COMMUNITY): Payer: Self-pay

## 2024-08-04 DIAGNOSIS — E236 Other disorders of pituitary gland: Secondary | ICD-10-CM | POA: Diagnosis not present

## 2024-08-04 DIAGNOSIS — G43819 Other migraine, intractable, without status migrainosus: Secondary | ICD-10-CM | POA: Diagnosis not present

## 2024-08-04 LAB — GLUCOSE, CAPILLARY
Glucose-Capillary: 144 mg/dL — ABNORMAL HIGH (ref 70–99)
Glucose-Capillary: 188 mg/dL — ABNORMAL HIGH (ref 70–99)

## 2024-08-04 MED ORDER — GENTAMICIN SULFATE 0.1 % EX CREA: 1.0000 | TOPICAL_CREAM | Freq: Every day | CUTANEOUS | Status: DC

## 2024-08-04 MED ORDER — GABAPENTIN 100 MG PO CAPS
200.0000 mg | ORAL_CAPSULE | Freq: Every day | ORAL | 0 refills | Status: DC
  Filled 2024-08-04: qty 60, 30d supply, fill #0

## 2024-08-04 MED ORDER — METHYLPREDNISOLONE 4 MG PO TBPK
ORAL_TABLET | ORAL | 0 refills | Status: DC
  Filled 2024-08-04: qty 21, 6d supply, fill #0

## 2024-08-04 MED ORDER — DELFLEX-LC/2.5% DEXTROSE 394 MOSM/L IP SOLN: INTRAPERITONEAL | Status: DC

## 2024-08-04 MED ORDER — METHOCARBAMOL 500 MG PO TABS
500.0000 mg | ORAL_TABLET | Freq: Four times a day (QID) | ORAL | 0 refills | Status: DC | PRN
  Filled 2024-08-04: qty 20, 5d supply, fill #0

## 2024-08-04 MED ORDER — HYDROCODONE-ACETAMINOPHEN 5-325 MG PO TABS
1.0000 | ORAL_TABLET | Freq: Four times a day (QID) | ORAL | 0 refills | Status: DC | PRN
  Filled 2024-08-04: qty 20, 5d supply, fill #0

## 2024-08-04 MED ORDER — DELFLEX-LC/1.5% DEXTROSE 344 MOSM/L IP SOLN: INTRAPERITONEAL | Status: DC

## 2024-08-04 NOTE — Discharge Summary (Signed)
 Physician Discharge Summary  [name] FMW:[id number] DOB: 11-Jul-1966 DOA: 07/30/2024  PCP: Gretta Comer POUR, NP  Admit date: 07/30/2024 Discharge date: 08/04/2024  Admitted from: Home Discharge disposition: Home with home health PT  Recommendations at discharge:  Complete a course of Medrol  Dosepak Cautious use of pain medicines Follow-up with neurosurgery as an outpatient    Subjective: Patient was seen and examined this morning. Lying down on bed.  Sister on the phone.   Patient is more alert than yesterday.  Tremors improving.  She is still concerned about weakness of left lower extremities. Afebrile, hemodynamically stable Blood sugar level fluctuating but mostly less than 200 Tolerated PD well last night  Brief narrative: [name] is a 58 y.o. female with PMH significant for HIV on therapy, DM2, HTN, ESRD on PD, h/o aortic dissection s/p stent, sarcoidosis, hidradenitis, Grave's disease s/p thyroidectomy on Synthroid , prior DVT, chronic back pain 10/14, patient presented to the ED with complaint of severe headache. Patient states he went to bed in her usual state of health on 10/12, woke up next morning 10/13 feeling pain in the frontal part of her head and face.  Had associated nausea, vomited once, also complained of some blurriness of vision, trouble walking, photophobia..  She reports recently completing a course of prednisone  on 10/12. Headache worsened throughout the day, woke up in the middle of the night around 10/13 with severe headache and presented to the ED next day.  CT angio head and neck did not show any large vessel occlusion, hemodynamically significant stenosis or any other emergent finding. 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 Recommend dedicated MRI of the pituitary/sella with and without contrast for further evaluation. MRV head was  unremarkable   Neurology was consulted. Admitted to [facility]. 10/16, seen by neurosurgery Dr. 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 Secondary mass effect on the optic chiasm, worse on the left, but no overt cavernous sinus invasion.  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  10/16, seen by neurosurgery Dr. Rosslyn.   Pituitary panel sent.  Noted normal levels of cortisol, LH, FSH, ACTH Prolactin level at 24.9 which is in the higher side of normal range (3.6-25.2) TSH low but free T4 and normal IGF level elevated. 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 Secondary mass effect on the optic chiasm, worse on the left, but no overt cavernous sinus invasion. 10/18, discussed with Dr. Janjua.  Recommended Medrol  dose pack and follow-up as an outpatient. Headache gradually improving.  Also sent a prescription for limited supply of pain medicines  Tremors Probably due to use of amitriptyline dialysis patient.  I have stopped amitriptyline Tremors improving.  HIV Reports compliance to HAART regimen Continue home Descovy  and [name] .  ESRD PD Nephrology consult appreciated. Continue nocturnal PD as before.    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  Minimal back pain at present   Leukocytosis No current evidence of infection  Blood culture did not show any growth.  Currently not on antibiotics.   Recent Labs  Lab 07/30/24 2001 08/01/24 0033 08/01/24 0229  WBC 16.1* 13.2* 14.7*   Type 2 diabetes mellitus A1c 6.1 on 07/31/2024 PTA meds-saxagliptin  2.5 mg daily.  Continue the same post discharge. Recent Labs  Lab 08/03/24 1138 08/03/24 1626 08/03/24 2156 08/04/24 0628 08/04/24 1208  GLUCAP 116* 128* 245* 144* 188*   Hypertension PTA meds list shows Coreg  12.5 mg twice daily, amlodipine  10 mg daily, losartan  50 mg daily, Currently heart rate and blood pressure is stable without these meds.  I am not sure if patient was really taking these medicines at home.  I do not see need to use them.    HLD PTA meds- aspirin , Crestor  Continue Crestor .  I would keep aspirin  on hold for the potential need of brain biopsy  Chronic macrocytic anemia Continue iron  supplement Recent Labs    07/18/24 1641 07/30/24 2001 07/30/24 2019 08/01/24 0033 08/01/24 0229  HGB 10.9* 10.8* 11.9* 10.0* 10.2*  MCV  --  110.0*  --  108.8* 110.2*   Hypothyroidism H/o thyroidectomy for Graves' disease Continue Synthroid    Chronic gout Cont allopurinol    H/o sarcoidosis   Hidradenitis suppurativa On doxycycline  chronically.  Also seems to be minocycline .  Prior DVT Not on chronic anticoagulation   Chronic back pain PTA meds- Tylenol  PRN, Norco PRN, Lidoderm  patch PRN, Robaxin  as needed.  Continue all.   Depression Continue BuSpar    Impaired mobility  PT eval obtained. Home health PT recommended  Goals of care   Code Status: Full Code   Diet:  Diet Order             Diet renal/carb modified with fluid restriction           Diet renal with fluid restriction Fluid restriction: 1200 mL Fluid; Room service appropriate? Yes; Fluid consistency: Thin  Diet effective now                    Nutritional status:  Body mass index is 30.67 kg/m.       Wounds:  -    Discharge Medications:   Allergies as of 08/04/2024       Reactions   Genvoya [elviteg-cobic-emtricit-tenofaf] Hives   Lisinopril Cough   Aldactone  [spironolactone ] Hives   Valium [diazepam] Other (See Comments)   Lethargy (pt states she cannot take this again)   Tegaderm Ag Mesh [silver] Itching        Medication List     STOP taking these medications    amLODipine  10 MG tablet Commonly known as: NORVASC    aspirin  EC 81 MG tablet   carvedilol  12.5 MG tablet Commonly known as: COREG    FreeStyle Lite w/Device Kit   losartan  50 MG tablet Commonly known as: COZAAR    predniSONE  10 MG (21) Tbpk tablet Commonly known as: STERAPRED UNI-PAK 21 TAB   traMADol  50 MG tablet Commonly known as: Ultram        TAKE these medications    acetaminophen  500 MG tablet Commonly known as: TYLENOL  Take 1,000 mg by mouth every 6 (six) hours as needed for mild pain.   albuterol  108 (90 Base) MCG/ACT inhaler Commonly known as: VENTOLIN  HFA INHALE 2 PUFFS INTO THE LUNGS UP TO EVERY 6HRS AS NEEDED FOR  WHEEZING/SHORTNESS OF BREATH   allopurinol  100 MG tablet Commonly known as: ZYLOPRIM  TAKE 1 TABLET (100 MG TOTAL) BY MOUTH DAILY. FOR GOUT PREVENTION   Auryxia  1 GM 210 MG(Fe) tablet Generic drug: ferric citrate  Take 210 mg by mouth 3 (three) times daily with meals.   busPIRone  5 MG tablet Commonly known as: BUSPAR  Take 1 tablet (5 mg total) by mouth 2 (two) times daily. For anxiety   dolutegravir  50 MG tablet Commonly known as: [name]  Take 1 tablet (50 mg total) by mouth daily.   emtricitabine -tenofovir  AF 200-25 MG tablet Commonly known as: DESCOVY  Take 1 tablet by mouth daily.   fluticasone  110 MCG/ACT inhaler Commonly known as: FLOVENT  HFA Inhale 1 puff into the lungs 2 (two) times daily.   gabapentin  100 MG capsule Commonly known as: Neurontin  Take 2 capsules (200 mg total) by  mouth at bedtime.   HYDROcodone -acetaminophen  5-325 MG tablet Commonly known as: NORCO/VICODIN Take 1 tablet by mouth every 6 (six) hours as needed. What changed:  how much to take when to take this   levocetirizine 5 MG tablet Commonly known as: XYZAL  TAKE 1 TABLET BY MOUTH EVERY DAY IN THE EVENING FOR ALLERGIES   lidocaine  5 % Commonly known as: LIDODERM  PLACE 1 PATCH ONTO THE SKIN DAILY. REMOVE & DISCARD PATCH WITHIN 12 HOURS OR AS DIRECTED BY MD What changed:  when to take this reasons to take this additional instructions   methocarbamol  500 MG tablet Commonly known as: ROBAXIN  Take 1 tablet (500 mg total) by mouth every 6 (six) hours as needed for muscle spasms.   methylPREDNISolone  4 MG Tbpk tablet Commonly known as: MEDROL  DOSEPAK follow package directions   minocycline  100 MG capsule Commonly known as: Minocin  Take 1 capsule (100 mg total) by mouth 2 (two) times daily.   omeprazole  40 MG capsule Commonly known as: PRILOSEC Take 1 capsule (40 mg total) by mouth daily. for heartburn.   ondansetron  4 MG disintegrating tablet Commonly known as: ZOFRAN -ODT Take 1 tablet (4 mg total) by mouth every 8 (eight) hours as needed for nausea or vomiting.   onetouch ultrasoft lancets Use as instructed to test blood sugar daily   rosuvastatin  10 MG tablet Commonly known as: CRESTOR  TAKE 1 TABLET BY MOUTH EVERY DAY FOR CHOLESTEROL   saxagliptin  HCl 2.5 MG Tabs tablet Commonly known as: ONGLYZA TAKE 1 TABLET (2.5 MG TOTAL) BY MOUTH DAILY. FOR DIABETES.   Synthroid  150 MCG tablet Generic drug: levothyroxine  TAKE 1 TABLET BY MOUTH DAILY BEFORE BREAKFAST.               Durable Medical Equipment  (From admission, onward)           Start     Ordered   08/01/24 1204  For home use only DME Walker rolling  Once       Question Answer Comment  Walker: With 5 Inch Wheels   Patient needs a walker to treat with the following condition Weakness      08/01/24 1203               Discharge Care Instructions  (From admission, onward)           Start     Ordered   08/04/24 0000  Discharge wound care:        08/04/24 1207             Follow ups:    Contact information for follow-up providers     [facility], [facility] Follow up.   Specialty: Home Health Services Why: Hedda will contact you for the first home visit. Contact information: [address] [place] [address] [phone]         Rosslyn Dino HERO, MD Follow up.   Specialty: Neurosurgery Contact information: [address] [place] [address] [phone]              Contact information for after-discharge care     Home Medical Care     [facility]) .   Service: Home Health Services Contact information: [address] [place]  [address] [phone]                     Discharge Instructions:   Discharge Instructions     Call MD for:  difficulty breathing, headache or visual disturbances   Complete by: As directed    Call MD for:  extreme fatigue   Complete by: As directed    Call MD for:  hives   Complete by: As directed    Call MD for:  persistant dizziness or light-headedness   Complete by: As directed    Call MD for:  persistant nausea and vomiting   Complete by: As directed    Call MD for:  severe uncontrolled pain   Complete by: As directed    Call MD for:  temperature >100.4   Complete by: As directed    Diet renal/carb modified with fluid restriction   Complete by: As directed    Discharge instructions   Complete by: As directed    Recommendations at discharge:   Complete a course of Medrol  Dosepak  Cautious use of pain medicines  Follow-up with neurosurgery as an outpatient  PDMP reviewed this encounter.   Opioid taper instructions: It is important to wean off of your opioid medication as soon as possible. If you do not  need pain medication after your surgery it is ok to stop day one. Opioids include: Codeine , Hydrocodone (Norco, Vicodin), Oxycodone (Percocet, oxycontin ) and hydromorphone  amongst others.  Long term and even short term use of opiods can cause: Increased pain response Dependence Constipation Depression Respiratory depression And more.  Withdrawal symptoms can include Flu like symptoms Nausea, vomiting And more Techniques to manage these symptoms Hydrate well Eat regular healthy meals Stay active Use relaxation techniques(deep breathing, meditating, yoga) Do Not substitute Alcohol  to help with tapering If you have been on opioids for less than two weeks and do not have pain than it is ok to stop all together.  Plan to wean off of opioids This plan should start within one week post op of your joint replacement. Maintain the same interval or time between taking each dose and first decrease the dose.  Cut the total daily intake of opioids by one tablet each day Next start to increase the time between doses. The last dose that should be eliminated is the evening dose.        General discharge instructions: Follow with Primary MD Gretta Comer POUR, NP in 7 days  Please request your PCP  to go over your hospital tests, procedures, radiology results at the follow up. Please get your medicines reviewed and adjusted.  Your PCP may decide to repeat certain labs or tests as needed. Do not drive, operate heavy machinery, perform activities at heights, swimming or participation in water  activities or provide baby sitting services if your were admitted for syncope or  siezures until you have seen by Primary MD or a Neurologist and advised to do so again. [place]  Controlled Substance Reporting System database was reviewed. Do not drive, operate heavy machinery, perform activities at heights, swim, participate in water  activities or provide baby-sitting services while on medications for pain,  sleep and mood until your outpatient physician has reevaluated you and advised to do so again.  You are strongly recommended to comply with the dose, frequency and duration of prescribed medications. Activity: As tolerated with Full fall precautions use walker/cane & assistance as needed Avoid using any recreational substances like cigarette, tobacco, alcohol , or non-prescribed drug. If you experience worsening of your admission symptoms, develop shortness of breath, life threatening emergency, suicidal or homicidal thoughts you must seek medical attention immediately by calling 911 or calling your MD immediately  if symptoms less severe. You must read complete instructions/literature along with all the possible adverse reactions/side effects for all the medicines you take and that have been prescribed to you. Take any new medicine only after you have completely understood and accepted all the possible adverse reactions/side effects.  Wear Seat belts while driving. You were cared for by a hospitalist during your hospital stay. If you have any questions about your discharge medications or the care you received while you were in the hospital after you are discharged, you can call the unit and ask to speak with the hospitalist or the covering physician. Once you are discharged, your primary care physician will handle any further medical issues. Please note that NO REFILLS for any discharge medications will be authorized once you are discharged, as it is imperative that you return to your primary care physician (or establish a relationship with a primary care physician if you do not have one).   Discharge wound care:   Complete by: As directed    Increase activity slowly   Complete by: As directed        Discharge Exam:   Vitals:   08/03/24 2353 08/04/24 0348 08/04/24 0814 08/04/24 1210  BP: 120/75 108/68 (!) 151/84 (!) 140/77  Pulse: 99 94 94 (!) 101  Resp:   18 18  Temp: 98.4 F (36.9 C) 98.4 F  (36.9 C) 98.1 F (36.7 C) 98.6 F (37 C)  TempSrc: Oral Oral Oral Oral  SpO2: 100% 98% 97% 100%  Weight:      Height:        Body mass index is 30.67 kg/m.  General exam: Pleasant, middle-aged African-American female.  Not in distress currently Skin: No rashes, lesions or ulcers. HEENT: Atraumatic, normocephalic, no obvious bleeding Lungs: Clear to auscultation bilaterally,  CVS: S1, S2, no murmur,   GI/Abd: Soft, nontender, nondistended, bowel sound present,   CNS: Alert, awake and oriented x 3.  Mental status improving.  Psychiatry: Mood appropriate Extremities: No pedal edema, no calf tenderness,    The results of significant diagnostics from this hospitalization (including imaging, microbiology, ancillary and laboratory) are listed below for reference.    Procedures and Diagnostic Studies:   MR BRAIN WO CONTRAST Result Date: 07/31/2024 EXAM: MRI BRAIN WITHOUT CONTRAST 07/31/2024 03:13:51 PM TECHNIQUE: Multiplanar multisequence MRI of the head/brain was performed without the administration of intravenous contrast. COMPARISON: Same day MRV head and CTA head 07/30/2024. CLINICAL HISTORY: Headache, new onset (Age >= 51y). Headache x5 days with neck stiffness. FINDINGS: BRAIN AND VENTRICLES: No acute infarct. No acute intracranial hemorrhage. Chronic microhemorrhages are present in the left frontal lobe and right parietal lobe. Scattered T2  FLAIR hyperintensity in the periventricular and subcortical white matter suggestive of mild chronic microvascular ischemic changes. Additional subtle subglamorality in the pons likely related to the same. 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 Recommend correlation with dedicated MRI of the pituitary/sella with and without contrast for further evaluation. No midline shift. No hydrocephalus. Normal flow voids. ORBITS: No acute abnormality. SINUSES AND MASTOIDS: No  acute abnormality. BONES AND SOFT TISSUES: Normal marrow signal. No acute soft tissue abnormality. IMPRESSION: 1. 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 Recommend dedicated MRI of the pituitary/sella with and without contrast for further evaluation. 2. Mild chronic microvascular ischemic changes. 3. No acute findings. Electronically signed by: Donnice Mania MD 07/31/2024 03:24 PM EDT RP Workstation: [id number]   MR MRV HEAD WO CM Result Date: 07/31/2024 EXAM: MRV BRAIN 07/31/2024 06:32:15 AM TECHNIQUE: Multiplanar multisequence MRV of the head was performed without the administration of intravenous contrast. Multiplanar 2D and 3D reformatted images are provided for review. COMPARISON: CT angiogram of the head dated 07/30/2024. CLINICAL HISTORY: 58 year old female with new onset headache, nausea, blurry vision, photophobia, and difficulty walking due to pain. History includes aortic dissection, HIV, HTN, DM, prior DVT, kidney stones, and ESRD on peritoneal dialysis. FINDINGS: No focal stenosis or thrombus is seen of the major dural venous sinuses. IMPRESSION: 1. Unremarkable MRV of the head. Electronically signed by: Evalene Coho MD 07/31/2024 06:51 AM EDT RP Workstation: [id number]   CT Angio Head W or Wo Contrast Result Date: 07/30/2024 EXAM: CTA Head Without and with Intravenous Contrast CLINICAL HISTORY: Headache, sudden, severe. Migraine; CT Angio Head W or Wo Contrast; Headache, sudden, severe. Omni 350 75mL; See ED Notes:; Pt BIB [facility] from home. Pt began having headache, light sensitivity, nausea, and vomiting beginning at 2am last night. Pt states it has continued throughout the day. Pt says she does not have hx of migraines. TECHNIQUE: Axial CTA images of the head without and with intravenous contrast. MIP reconstructed images were created and reviewed. Dose reduction technique was used including one or more of the following: automated exposure  control, adjustment of mA and kV according to patient size, and/or iterative reconstruction. CONTRAST: Without and with intravenous contrast; 75 mL iohexol  (OMNIPAQUE ) 350 MG/ML injection. COMPARISON: None provided. FINDINGS: BRAIN PARENCHYMA: Cerebral volume within normal limits. Patchy hypodensity involving the supratentorial cerebral white matter, most characteristic of chronic microvascular ischemic disease. No acute intracranial hemorrhage. No acute large vessel territory infarct. No mass lesion or midline shift. No hydrocephalus or axial shift or fluid collection. No abnormal hyperdense vessel. Calcified atherosclerosis present about the skull base. INTERNAL CAROTID ARTERIES: Mild atherosclerotic change about the carotid siphons without hemodynamically significant stenosis. A1 segments, anterior communicative complex, and anterior cerebral arteries are widely patent. No 1.1 cm occlusion. Dyscalcified branches perfusion asymmetric. No intracranial aneurysm. ANTERIOR CEREBRAL ARTERIES: Widely patent. No significant stenosis. No occlusion. No aneurysm. MIDDLE CEREBRAL ARTERIES: No significant stenosis. No occlusion. No aneurysm. POSTERIOR CEREBRAL ARTERIES: Patent bilaterally. No significant stenosis. No occlusion. No aneurysm. BASILAR ARTERY: Patent without stenosis. No occlusion. No aneurysm. VERTEBRAL ARTERIES: Both V4 segments patent without significant stenosis. Left vertebral artery dominant. Right PICA patent at its origin. Left PICA origin not well seen. No intracranial aneurysm. SOFT TISSUES: Scalp soft tissues demonstrate no acute finding. No masses or lymphadenopathy. Paranasal sinuses and mastoid air cells are largely clear. BONES: Calvarium intact. No acute osseous abnormality. IMPRESSION: 1. Negative  CTA for acute large vessel occlusion or other emergent findings. No hemodynamically significant or correctable stenosis. No intracranial aneurysm. 2. No no other acute intracranial abnormality. 3.  Chronic microvascular ischemic disease. . Electronically signed by: Morene Hoard MD 07/30/2024 10:18 PM EDT RP Workstation: [id number]     Labs:   Basic Metabolic Panel: Recent Labs  Lab 07/30/24 2011 07/30/24 2019 08/01/24 0229 08/02/24 0525  NA 136 136 135 133*  K 4.8 4.5 3.7 3.8  CL 93* 97* 95* 93*  CO2 23  --  20* 19*  GLUCOSE 73 72 106* 101*  BUN 79* 78* 81* 79*  CREATININE 13.88* 13.60* 14.10* 13.77*  CALCIUM  8.3*  --  8.2* 8.1*  PHOS  --   --   --  9.6*   GFR Estimated Creatinine Clearance: 4.9 mL/min (A) (by C-G formula based on SCr of 13.77 mg/dL (H)). Liver Function Tests: Recent Labs  Lab 07/30/24 2011 08/01/24 0229 08/02/24 0525  AST 62* 58*  --   ALT 90* 70*  --   ALKPHOS 157* 153*  --   BILITOT 0.6 0.7  --   PROT 6.4* 5.5*  --   ALBUMIN  2.6* 2.2* 2.1*   No results for input(s): LIPASE, AMYLASE in the last 168 hours. No results for input(s): AMMONIA in the last 168 hours. Coagulation profile No results for input(s): INR, PROTIME in the last 168 hours.  CBC: Recent Labs  Lab 07/30/24 2001 07/30/24 2019 08/01/24 0033 08/01/24 0229  WBC 16.1*  --  13.2* 14.7*  NEUTROABS 13.0*  --   --   --   HGB 10.8* 11.9* 10.0* 10.2*  HCT 31.9* 35.0* 29.7* 30.3*  MCV 110.0*  --  108.8* 110.2*  PLT 200  --  164 168   Cardiac Enzymes: No results for input(s): CKTOTAL, CKMB, CKMBINDEX, TROPONINI in the last 168 hours. BNP: Invalid input(s): POCBNP CBG: Recent Labs  Lab 08/03/24 1138 08/03/24 1626 08/03/24 2156 08/04/24 0628 08/04/24 1208  GLUCAP 116* 128* 245* 144* 188*   D-Dimer No results for input(s): DDIMER in the last 72 hours. Hgb A1c No results for input(s): HGBA1C in the last 72 hours. Lipid Profile No results for input(s): CHOL, HDL, LDLCALC, TRIG, CHOLHDL, LDLDIRECT in the last 72 hours. Thyroid  function studies Recent Labs    08/01/24 1546  TSH 0.231*   Anemia work up No results for  input(s): VITAMINB12, FOLATE, FERRITIN, TIBC, IRON , RETICCTPCT in the last 72 hours. Microbiology Recent Results (from the past 240 hours)  Resp panel by RT-PCR (RSV, Flu A&B, Covid) Anterior Nasal Swab     Status: None   Collection Time: 07/30/24  6:27 PM   Specimen: Anterior Nasal Swab  Result Value Ref Range Status   SARS Coronavirus 2 by RT PCR NEGATIVE NEGATIVE Final   Influenza A by PCR NEGATIVE NEGATIVE Final   Influenza B by PCR NEGATIVE NEGATIVE Final    Comment: (NOTE) The Xpert Xpress SARS-CoV-2/FLU/RSV plus assay is intended as an aid in the diagnosis of influenza from Nasopharyngeal swab specimens and should not be used as a sole basis for treatment. Nasal washings and aspirates are unacceptable for Xpert Xpress SARS-CoV-2/FLU/RSV testing.  Fact Sheet for Patients: BloggerCourse.com  Fact Sheet for Healthcare Providers: SeriousBroker.it  This test is not yet approved or cleared by the [place]  FDA and has been authorized for detection and/or diagnosis of SARS-CoV-2 by FDA under an Emergency Use Authorization (EUA). This EUA will remain in effect (meaning this test can be used)  for the duration of the COVID-19 declaration under Section 564(b)(1) of the Act, 21 U.S.C. section 360bbb-3(b)(1), unless the authorization is terminated or revoked.     Resp Syncytial Virus by PCR NEGATIVE NEGATIVE Final    Comment: (NOTE) Fact Sheet for Patients: BloggerCourse.com  Fact Sheet for Healthcare Providers: SeriousBroker.it  This test is not yet approved or cleared by the [place]  FDA and has been authorized for detection and/or diagnosis of SARS-CoV-2 by FDA under an Emergency Use Authorization (EUA). This EUA will remain in effect (meaning this test can be used) for the duration of the COVID-19 declaration under Section 564(b)(1) of the Act, 21  U.S.C. section 360bbb-3(b)(1), unless the authorization is terminated or revoked.  Performed at [facility], [address] [place], [place] [address]   Culture, blood (Routine X 2) w Reflex to ID Panel     Status: None (Preliminary result)   Collection Time: 07/31/24  7:15 PM   Specimen: BLOOD LEFT HAND  Result Value Ref Range Status   Specimen Description BLOOD LEFT HAND  Final   Special Requests   Final    BOTTLES DRAWN AEROBIC AND ANAEROBIC Blood Culture adequate volume   Culture   Final    NO GROWTH 4 DAYS Performed at [facility], [address] [place], [place] [address]    Report Status PENDING  Incomplete  Culture, blood (Routine X 2) w Reflex to ID Panel     Status: None (Preliminary result)   Collection Time: 07/31/24  7:29 PM   Specimen: BLOOD RIGHT HAND  Result Value Ref Range Status   Specimen Description BLOOD RIGHT HAND  Final   Special Requests   Final    BOTTLES DRAWN AEROBIC AND ANAEROBIC Blood Culture adequate volume   Culture   Final    NO GROWTH 4 DAYS Performed at [facility], [address], [place], [place] [address]    Report Status PENDING  Incomplete    Time coordinating discharge: 45 minutes  Signed: Abbygale Lapid  [facility] 08/04/2024, 1:43 PM

## 2024-08-04 NOTE — Discharge Planning (Signed)
 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 Secondary mass effect on optic chiasm, worse on L, but no overt sinus invasion   Headaches 2/2 #1  HD ORDER CHANGES: Heparin  change: no EDW Change: no Bath Change: no       ANEMIA MANAGEMENT: Aranesp : Given: no    PRBC's Given: no ESA dose for discharge: per protocol IV Iron  dose at discharge: per protocol   BONE/MINERAL MEDICATIONS: Hectorol /Calcitriol change: no Sensipar/Parsabiv change: no   ACCESS INTERVENTION/CHANGE: no Details:   RECENT LABS: Recent Labs  Lab 08/02/24 0525  K 3.8  CALCIUM  8.1*  ALBUMIN  2.1*  PHOS 9.6*   Recent Labs  Lab 08/01/24 0229  HGB 10.2*   Blood Culture    Component Value Date/Time   SDES BLOOD RIGHT HAND 07/31/2024 1929   SPECREQUEST  07/31/2024 1929    BOTTLES DRAWN AEROBIC AND ANAEROBIC Blood Culture adequate volume   CULT  07/31/2024 1929    NO GROWTH 4 DAYS Performed at [facility], [address] [place], [place] [address]    REPTSTATUS PENDING 07/31/2024 1929       IV ANTIBIOTICS: no Details:   OTHER ANTICOAGULATION:  On Eliquis : no On Coumadin: no   OTHER/APPTS/LAB ORDERS: Discharge on Medrol  dose pack and to follow up with neurosurgery    D/C Meds to be reconciled by nurse after every discharge.  Completed By: Manuelita Labella PA-C   Reviewed by: MD:______ RN_______

## 2024-08-04 NOTE — Plan of Care (Signed)
  Problem: Education: Goal: Knowledge of General Education information will improve Description: Including pain rating scale, medication(s)/side effects and non-pharmacologic comfort measures Outcome: Progressing   Problem: Activity: Goal: Risk for activity intolerance will decrease Outcome: Progressing   Problem: Pain Managment: Goal: General experience of comfort will improve and/or be controlled Outcome: Progressing

## 2024-08-04 NOTE — Progress Notes (Signed)
  CCPD treatment completed, patient tolerated treatment well.    08/04/24 0739  Peritoneal Catheter Mid lower abdomen  Placement Date/Time: 01/10/24 1213   Serial / Lot #: [id number]  Expiration Date: 04/15/28  Procedural Verification: Medical records & consent reviewed;Relevant studies,results and images reviewed;Site marked with initials  Time out: Correct Patient;Corre...  Site Assessment Clean, Dry, Intact  Catheter status Deaccessed  Dressing Gauze/Drain sponge  Dressing Status Clean, Dry, Intact  Completion  Treatment Status Complete  Initial Drain Volume 39  Average Dwell Time-Hour(s) 1  Average Dwell Time-Min(s) 30  Average Drain Time 26  Total Therapy Volume [id number]  Total Therapy Time-Hour(s) 12  Total Therapy Time-Min(s) 44  Effluent Appearance Clear;Yellow  Fluid Balance - CCPD  Total UF (+ value on cycler, pt loss) -71 mL  Procedure Comments  Tolerated treatment well? Yes  Peritoneal Dialysis Comments Patient tolerated treatment well  Education / Care Plan  Dialysis Education Provided Yes  Hand-off documentation  Hand-off Given Given to shift RN/LPN  Report given to (Full Name) Warren Foy, RN

## 2024-08-04 NOTE — Progress Notes (Signed)
 [facility] Progress Note   Subjective:   Patient seen and examined at bedside.  Feeling better today.  Slept well overnight. No issues with PD. Tremor improving.  Discussed it was likely related to high dose gabapentin .   Objective Vitals:   08/03/24 2018 08/03/24 2353 08/04/24 0348 08/04/24 0814  BP:  120/75 108/68 (!) 151/84  Pulse:  99 94 94  Resp:    18  Temp:  98.4 F (36.9 C) 98.4 F (36.9 C) 98.1 F (36.7 C)  TempSrc:  Oral Oral Oral  SpO2: 95% 100% 98% 97%  Weight:      Height:       Physical Exam General:alert female in NAD Heart:RRR, no mrg Lungs:nml WOB on RA Abdomen:soft, NTND Extremities:no LE edema Dialysis Access: PD catheter in LUQ   Filed Weights   08/01/24 0500 08/02/24 0500 08/02/24 1440  Weight: 86 kg 91.5 kg 83.6 kg    Intake/Output Summary (Last 24 hours) at 08/04/2024 1121 Last data filed at 08/04/2024 0739 Gross per 24 hour  Intake 320 ml  Output -71 ml  Net 391 ml    Additional Objective Labs: Basic Metabolic Panel: Recent Labs  Lab 07/30/24 2011 07/30/24 2019 08/01/24 0229 08/02/24 0525  NA 136 136 135 133*  K 4.8 4.5 3.7 3.8  CL 93* 97* 95* 93*  CO2 23  --  20* 19*  GLUCOSE 73 72 106* 101*  BUN 79* 78* 81* 79*  CREATININE 13.88* 13.60* 14.10* 13.77*  CALCIUM  8.3*  --  8.2* 8.1*  PHOS  --   --   --  9.6*   Liver Function Tests: Recent Labs  Lab 07/30/24 2011 08/01/24 0229 08/02/24 0525  AST 62* 58*  --   ALT 90* 70*  --   ALKPHOS 157* 153*  --   BILITOT 0.6 0.7  --   PROT 6.4* 5.5*  --   ALBUMIN  2.6* 2.2* 2.1*   CBC: Recent Labs  Lab 07/30/24 2001 07/30/24 2019 08/01/24 0033 08/01/24 0229  WBC 16.1*  --  13.2* 14.7*  NEUTROABS 13.0*  --   --   --   HGB 10.8* 11.9* 10.0* 10.2*  HCT 31.9* 35.0* 29.7* 30.3*  MCV 110.0*  --  108.8* 110.2*  PLT 200  --  164 168    Studies/Results: MR BRAIN W WO CONTRAST Result Date: 08/02/2024 EXAM: MRI BRAIN WITH AND WITHOUT CONTRAST 08/02/2024 06:04:53  PM TECHNIQUE: Multiplanar multisequence MRI of the head/brain was performed with and without the administration of intravenous contrast. Examination moderately degraded by motion artifact. COMPARISON: Prior MRI from 07/31/2024. CLINICAL HISTORY: Pituitary tumor. 10mL Gadavist  Pituitary tumor; Technically difficult due to patient motion. FINDINGS: BRAIN AND VENTRICLES: Cerebral volume within normal limits. Patchy T2 FLAIR hyperintensity involving the periventricular and deep white matter of multiple cerebral hemispheres, most characteristic of chronic small vessel ischemic disease. Patchy involvement of the pons noted. Few small chronic microhemorrhages noted, likely small vessel/hypertensive in nature. No acute infarct. No acute intracranial hemorrhage. No mass effect or midline shift from the brain parenchyma. No hydrocephalus. In the sellar/suprasellar region, again seen is an expansile suprasellar mass corresponding with abnormality on prior MRI. Lesion measures approximately 2.0 x 1.3 x 1.5 cm (craniocaudad x AP x transverse). Lesion demonstrates increased precontrast T1 signal intensity as compared to previous exam. Areas of heterogeneity T2/FLAIR signal abnormality seen centrally. Associated susceptibility artifact noted (series 6, image 32). Evaluation for associated enhancement limited by the precontrast T1 signal. 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 Mass effect on the optic chiasm, which is slightly displaced superiorly, worse on the left (series 11, image 10). No overt cavernous sinus invasion. Normal flow voids. No mass or abnormal enhancement within the brain parenchyma. ORBITS: No acute abnormality. SINUSES: No acute abnormality. BONES AND SOFT TISSUES: Normal bone marrow signal and enhancement. No acute soft tissue abnormality. IMPRESSION: 1. 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 Secondary mass effect on the optic chiasm, worse on the left, but no overt cavernous sinus invasion. 2. Underlying mild chronic microvascular ischemic disease. Electronically signed by: Morene Hoard MD 08/02/2024 06:52 PM EDT RP Workstation: [id number]    Medications:  dialysis solution 1.5% low-MG/low-CA      allopurinol   100 mg Oral Daily   amitriptyline  12.5 mg Oral QHS   budesonide  (PULMICORT ) nebulizer solution  0.25 mg Nebulization BID   busPIRone   5 mg Oral BID   dolutegravir   50 mg Oral Daily   emtricitabine -tenofovir  AF  1 tablet Oral Daily   ferric citrate   420 mg Oral TID WC   gabapentin   200 mg Oral QHS   gentamicin  cream  1 Application Topical Daily   heparin   5,000 Units Subcutaneous Q8H   insulin  aspart  0-6 Units Subcutaneous TID WC   levothyroxine   150 mcg Oral QAC breakfast   minocycline   100 mg Oral BID   pantoprazole   40 mg Oral Daily   predniSONE   10 mg Oral Q breakfast   rosuvastatin   10 mg Oral Daily   senna-docusate  1 tablet Oral QHS    Dialysis Orders: CCPD 7x week-[facility] Primary Nephrologist Dr. Norine 5 exchanges EDW 79.5kg Fill volume  1.5hrs dwell time 1/2 1.5% bags; 1/2 2.5% bags (per patient)   Assessment/Plan: Pituitary mass - likely contributing to new onset headaches. CT neck/head and MRV stable. 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 Secondary mass effect on optic chiasm, worse on L, but no overt sinus invasion. Neurology and Neurosurgery consulted. Tremor - new onset.  Suspect related to high dose gabapentin . Improved with lower dose.  ESRD -  on PD, compliant with treatment with no issues at home. Tolerating PD overnight. Plan to return to regular PD orders tonight. Asked bedside RN to obtain a  standing weight. Does not appear overloaded, will use 1/2 1.5% and 1/2 2.5% bags tonight.  Hypertension/volume  - Blood pressures are trending down. Not on any BP meds. Does not appear volume overloaded. UF as tolerated.  Anemia of CKD - Hgb trending down, now 10.2. Fe/ESA is not indicated at this time. Follow trend closely. Not on ESA. Secondary Hyperparathyroidism -  Corr Ca ok, Phos elevated.  On auryxia , increase to 2 AC TID. Nutrition - Renal diet with fluid restriction  Manuelita Labella, PA-C [facility] 08/04/2024,11:21 AM  LOS: 0 days

## 2024-08-04 NOTE — TOC Transition Note (Signed)
 Transition of Care [facility]) - Discharge Note   Patient Details  Name: [name] MRN: [id number] Date of Birth: 12/22/1965  Transition of Care [facility]) CM/SW Contact:  Marval Gell, RN Phone Number: 08/04/2024, 12:23 PM   Clinical Narrative:     [name] of DC.  No other care manager needs identified at this time   Final next level of care: Home w Home Health Services Barriers to Discharge: No Barriers Identified   Patient Goals and CMS Choice   CMS Medicare.gov Compare Post Acute Care list provided to:: Patient Choice offered to / list presented to : Patient      Discharge Placement                       Discharge Plan and Services Additional resources added to the After Visit Summary for     Discharge Planning Services: CM Consult Post Acute Care Choice: Home Health, Durable Medical Equipment                    HH Arranged: PT, OT [facility] Agency: [facility] Date [facility] Agency Contacted: 08/04/24 Time HH Agency Contacted: 1223 Representative spoke with at [facility] Agency: [name]  Social Drivers of Health (SDOH) Interventions SDOH Screenings   Food Insecurity: No Food Insecurity (07/31/2024)  Recent Concern: Food Insecurity - Food Insecurity Present (06/27/2024)  Housing: Low Risk  (07/31/2024)  Recent Concern: Housing - High Risk (06/27/2024)  Transportation Needs: No Transportation Needs (07/31/2024)  Utilities: Not At Risk (07/31/2024)  Recent Concern: Utilities - At Risk (06/27/2024)  Alcohol  Screen: Low Risk  (06/27/2024)  Depression (PHQ2-9): Low Risk  (06/27/2024)  Recent Concern: Depression (PHQ2-9) - Medium Risk (06/05/2024)  Financial Resource Strain: Medium Risk (06/27/2024)  Physical Activity: Inactive (06/27/2024)  Social Connections: Unknown (06/27/2024)  Stress: Stress Concern Present (06/27/2024)  Tobacco Use: Medium Risk (07/31/2024)  Health Literacy: Adequate Health Literacy (06/27/2024)     Readmission Risk Interventions     02/21/2023    4:05 PM  Readmission Risk Prevention Plan  Transportation Screening Complete  Medication Review (RN Care Manager) Complete  PCP or Specialist appointment within 3-5 days of discharge Complete  HRI or Home Care Consult Complete  SW Recovery Care/Counseling Consult Complete  Palliative Care Screening Not Applicable  Skilled Nursing Facility Not Applicable

## 2024-08-04 NOTE — Plan of Care (Signed)
  Problem: Education: Goal: Ability to describe self-care measures that may prevent or decrease complications (Diabetes Survival Skills Education) will improve Outcome: Adequate for Discharge Goal: Individualized Educational Video(s) Outcome: Adequate for Discharge   Problem: Coping: Goal: Ability to adjust to condition or change in health will improve Outcome: Adequate for Discharge   Problem: Fluid Volume: Goal: Ability to maintain a balanced intake and output will improve Outcome: Adequate for Discharge   Problem: Metabolic: Goal: Ability to maintain appropriate glucose levels will improve Outcome: Adequate for Discharge   Problem: Nutritional: Goal: Maintenance of adequate nutrition will improve Outcome: Adequate for Discharge Goal: Progress toward achieving an optimal weight will improve Outcome: Adequate for Discharge   Problem: Skin Integrity: Goal: Risk for impaired skin integrity will decrease Outcome: Adequate for Discharge   Problem: Tissue Perfusion: Goal: Adequacy of tissue perfusion will improve Outcome: Adequate for Discharge   Problem: Education: Goal: Knowledge of General Education information will improve Description: Including pain rating scale, medication(s)/side effects and non-pharmacologic comfort measures 08/04/2024 1300 by Berkeley Verdie BRAVO, RN Outcome: Adequate for Discharge 08/04/2024 1050 by Berkeley Verdie BRAVO, RN Outcome: Progressing   Problem: Health Behavior/Discharge Planning: Goal: Ability to manage health-related needs will improve Outcome: Adequate for Discharge   Problem: Clinical Measurements: Goal: Ability to maintain clinical measurements within normal limits will improve Outcome: Adequate for Discharge Goal: Will remain free from infection Outcome: Adequate for Discharge Goal: Diagnostic test results will improve Outcome: Adequate for Discharge Goal: Respiratory complications will improve Outcome: Adequate for Discharge Goal:  Cardiovascular complication will be avoided Outcome: Adequate for Discharge   Problem: Activity: Goal: Risk for activity intolerance will decrease 08/04/2024 1300 by Berkeley Verdie BRAVO, RN Outcome: Adequate for Discharge 08/04/2024 1050 by Berkeley Verdie BRAVO, RN Outcome: Progressing   Problem: Nutrition: Goal: Adequate nutrition will be maintained Outcome: Adequate for Discharge   Problem: Coping: Goal: Level of anxiety will decrease Outcome: Adequate for Discharge   Problem: Elimination: Goal: Will not experience complications related to bowel motility Outcome: Adequate for Discharge Goal: Will not experience complications related to urinary retention Outcome: Adequate for Discharge   Problem: Pain Managment: Goal: General experience of comfort will improve and/or be controlled 08/04/2024 1300 by Berkeley Verdie BRAVO, RN Outcome: Adequate for Discharge 08/04/2024 1050 by Berkeley Verdie BRAVO, RN Outcome: Progressing   Problem: Safety: Goal: Ability to remain free from injury will improve Outcome: Adequate for Discharge   Problem: Skin Integrity: Goal: Risk for impaired skin integrity will decrease Outcome: Adequate for Discharge

## 2024-08-05 ENCOUNTER — Telehealth: Payer: Self-pay

## 2024-08-05 LAB — CULTURE, BLOOD (ROUTINE X 2)
Culture: NO GROWTH
Culture: NO GROWTH
Special Requests: ADEQUATE
Special Requests: ADEQUATE

## 2024-08-05 NOTE — Telephone Encounter (Signed)
 LVM per dpr advising patient appt has been changed to virtual for tomorrow.

## 2024-08-05 NOTE — Telephone Encounter (Signed)
 Absolutely.

## 2024-08-05 NOTE — Progress Notes (Signed)
 Late Note Entry- August 05, 2024  Pt ws d/c yesterday. Contacted [facility] home therapy dept this morning to be advised of pt's d/c date and that pt should resume home PD at d/c.   Randine Mungo Dialysis Navigator [phone]

## 2024-08-05 NOTE — Telephone Encounter (Signed)
 Copied from CRM #[id number]. Topic: Appointments - Appointment Cancel/Reschedule >> Aug 05, 2024  8:16 AM Carlyon D wrote:  Pt is calling in regards to her ED follow up appt tomorrow she is asking if the appt can be changed to virtual visit as they took her driving privileges away and she has no way of getting to the office. Called CAL was told to send CRM to clinical to get this change approved please reach out to pt as her appt is tomorrow

## 2024-08-05 NOTE — Transitions of Care (Post Inpatient/ED Visit) (Signed)
 08/05/2024  Name: [name] MRN: [id number] DOB: 12/01/1965  Today's TOC FU Call Status: Today's TOC FU Call Status:: Successful TOC FU Call Completed TOC FU Call Complete Date: 08/05/24 Patient's Name and Date of Birth confirmed.  Transition Care Management Follow-up Telephone Call Date of Discharge: 08/06/24 Discharge Facility: [name]) Type of Discharge: Inpatient Admission Primary Inpatient Discharge Diagnosis:: migraine How have you been since you were released from the hospital?: Same Any questions or concerns?: Yes Patient Questions/Concerns:: (S) States that nephrology told her that she shouldn't take Gabapentin  and she was asking for clarification since she was discharged with new prescription Patient Questions/Concerns Addressed: Notified Provider of Patient Questions/Concerns  Items Reviewed: Did you receive and understand the discharge instructions provided?: Yes Medications obtained,verified, and reconciled?: Yes (Medications Reviewed) Any new allergies since your discharge?: No Dietary orders reviewed?: NA Do you have support at home?: Yes  Medications Reviewed Today: Medications Reviewed Today     Reviewed by Lavelle Charmaine NOVAK, LPN (Licensed Practical Nurse) on 08/05/24 at 1521  Med List Status: <None>   Medication Order Taking? Sig Documenting Provider Last Dose Status Informant  acetaminophen  (TYLENOL ) 500 MG tablet [id number] Yes Take 1,000 mg by mouth every 6 (six) hours as needed for mild pain. [provider]  Active Self  albuterol  (VENTOLIN  HFA) 108 (90 Base) MCG/ACT inhaler [id number] Yes INHALE 2 PUFFS INTO THE LUNGS UP TO EVERY 6HRS AS NEEDED FOR WHEEZING/SHORTNESS OF BREATH Clark, Katherine K, NP  Active Self  allopurinol  (ZYLOPRIM ) 100 MG tablet [id number] Yes TAKE 1 TABLET (100 MG TOTAL) BY MOUTH DAILY. FOR GOUT PREVENTION Clark, Katherine K, NP  Active Self  AURYXIA  1 GM 210 MG(Fe) tablet [id number] Yes Take 210 mg by mouth 3  (three) times daily with meals. [provider]  Active Self           Med Note (SATTERFIELD, TEENA BRAVO   Tue Jul 30, 2024  6:32 PM) Patient verified she is still taking this medication   busPIRone  (BUSPAR ) 5 MG tablet [id number] Yes Take 1 tablet (5 mg total) by mouth 2 (two) times daily. For anxiety Clark, Katherine K, NP  Active Self  dolutegravir  ([name] ) 50 MG tablet [id number] Yes Take 1 tablet (50 mg total) by mouth daily. Melvenia Corean SAILOR, NP  Active Self  emtricitabine -tenofovir  AF (DESCOVY ) 200-25 MG tablet [id number] Yes Take 1 tablet by mouth daily. Melvenia Corean SAILOR, NP  Active Self  fluticasone  (FLOVENT  HFA) 110 MCG/ACT inhaler [id number] Yes Inhale 1 puff into the lungs 2 (two) times daily. [provider]  Active Self           Med Note KERRIN, MELISSA R   Fri Dec 15, 2023 12:45 PM)    gabapentin  (NEURONTIN ) 100 MG capsule [id number]  Take 2 capsules (200 mg total) by mouth at bedtime.  Patient not taking: Reported on 08/05/2024   Arlice Reichert, MD  Active   HYDROcodone -acetaminophen  (NORCO/VICODIN) 5-325 MG tablet [id number] Yes Take 1 tablet by mouth every 6 (six) hours as needed. Arlice Reichert, MD  Active   Lancets [facility] ULTRASOFT) lancets [id number] Yes Use as instructed to test blood sugar daily Clark, Katherine K, NP  Active Self  levocetirizine (XYZAL ) 5 MG tablet [id number] Yes TAKE 1 TABLET BY MOUTH EVERY DAY IN THE EVENING FOR ALLERGIES Clark, Katherine K, NP  Active Self  lidocaine  (LIDODERM ) 5 % [id number] Yes PLACE 1 PATCH ONTO THE SKIN DAILY. REMOVE & DISCARD PATCH WITHIN 12 HOURS OR AS  DIRECTED BY MD  Patient taking differently: Place 1 patch onto the skin daily as needed (for pain).   Gretta Comer POUR, NP  Active Self  methocarbamol  (ROBAXIN ) 500 MG tablet [id number] Yes Take 1 tablet (500 mg total) by mouth every 6 (six) hours as needed for muscle spasms. Arlice Reichert, MD  Active   methylPREDNISolone  (MEDROL  DOSEPAK) 4 MG TBPK tablet [id number]  Yes follow package directions Dahal, Reichert, MD  Active   minocycline  (MINOCIN ) 100 MG capsule [id number] Yes Take 1 capsule (100 mg total) by mouth 2 (two) times daily. Melvenia Corean SAILOR, NP  Active Self  omeprazole  (PRILOSEC) 40 MG capsule [id number] Yes Take 1 capsule (40 mg total) by mouth daily. for heartburn. Gretta Comer POUR, NP  Active Self  ondansetron  (ZOFRAN -ODT) 4 MG disintegrating tablet [id number] Yes Take 1 tablet (4 mg total) by mouth every 8 (eight) hours as needed for nausea or vomiting. Clark, Katherine K, NP  Active Self  rosuvastatin  (CRESTOR ) 10 MG tablet [id number] Yes TAKE 1 TABLET BY MOUTH EVERY DAY FOR CHOLESTEROL Clark, Katherine K, NP  Active Self  saxagliptin  HCl (ONGLYZA) 2.5 MG TABS tablet [id number] Yes TAKE 1 TABLET (2.5 MG TOTAL) BY MOUTH DAILY. FOR DIABETES. Clark, Katherine K, NP  Active Self  SYNTHROID  150 MCG tablet [id number] Yes TAKE 1 TABLET BY MOUTH DAILY BEFORE BREAKFAST. Trixie File, MD  Active Self            Home Care and Equipment/Supplies: Were Home Health Services Ordered?: Yes Name of Home Health Agency:: [facility] Any new equipment or medical supplies ordered?: NA  Functional Questionnaire: Do you need assistance with bathing/showering or dressing?: No Do you need assistance with meal preparation?: No Do you need assistance with eating?: No Do you have difficulty maintaining continence: No Do you need assistance with getting out of bed/getting out of a chair/moving?: Yes Do you have difficulty managing or taking your medications?: No  Follow up appointments reviewed: PCP Follow-up appointment confirmed?: Yes Date of PCP follow-up appointment?: 08/06/24 Follow-up Provider: Comer Gretta NP Specialist Hospital Follow-up appointment confirmed?: No Reason Specialist Follow-Up Not Confirmed: Patient has Specialist Provider Number and will Call for Appointment Do you need transportation to your follow-up appointment?: No Do you  understand care options if your condition(s) worsen?: Yes-patient verbalized understanding    SIGNATURE Charmaine Bloodgood, LPN [facility] Health Advisor [name] You Are. We Are. [facility] Direct Dial [phone]

## 2024-08-06 ENCOUNTER — Telehealth (INDEPENDENT_AMBULATORY_CARE_PROVIDER_SITE_OTHER): Admitting: Primary Care

## 2024-08-06 ENCOUNTER — Encounter: Payer: Self-pay | Admitting: Primary Care

## 2024-08-06 DIAGNOSIS — N898 Other specified noninflammatory disorders of vagina: Secondary | ICD-10-CM

## 2024-08-06 DIAGNOSIS — G43819 Other migraine, intractable, without status migrainosus: Secondary | ICD-10-CM

## 2024-08-06 DIAGNOSIS — N186 End stage renal disease: Secondary | ICD-10-CM | POA: Diagnosis not present

## 2024-08-06 DIAGNOSIS — Z992 Dependence on renal dialysis: Secondary | ICD-10-CM | POA: Diagnosis not present

## 2024-08-06 DIAGNOSIS — N2581 Secondary hyperparathyroidism of renal origin: Secondary | ICD-10-CM | POA: Diagnosis not present

## 2024-08-06 MED ORDER — FLUCONAZOLE 150 MG PO TABS: 150.0000 mg | ORAL_TABLET | Freq: Once | ORAL | 0 refills | Status: AC

## 2024-08-06 NOTE — Assessment & Plan Note (Signed)
 Antibiotic induced likely. Start fluconazole  150 mg tablet x 1 dose.

## 2024-08-06 NOTE — Patient Instructions (Signed)
 Please call your kidney team to see if you can take either Imitrex or gabapentin .  Follow-up with neurosurgery as scheduled.  It was a pleasure to see you today!

## 2024-08-06 NOTE — Assessment & Plan Note (Signed)
 Secondary to hemorrhagic pituitary macroadenoma. Recent hospitalization notes, labs, imaging reviewed.  Since her headaches have persisted despite prescriptions mentioned in HPI, she will contact her kidney team to see if she may take Imitrex or gabapentin . Reconsider amitriptyline if warranted.  She does not recall tremors.  Follow-up with neurosurgery as scheduled.

## 2024-08-06 NOTE — Progress Notes (Signed)
 Patient ID: [name], female    DOB: 1966/03/06, 58 y.o.   MRN: [id number]  Virtual visit completed through care agility, a video enabled telemedicine application. Due to national recommendations of social distancing due to COVID-19, a virtual visit is felt to be most appropriate for this patient at this time. Reviewed limitations, risks, security and privacy concerns of performing a virtual visit and the availability of in person appointments. I also reviewed that there may be a patient responsible charge related to this service. The patient agreed to proceed.   Patient location: home Provider location: [place] [facility], office Persons participating in this virtual visit: patient, provider   If any vitals were documented, they were collected by patient at home unless specified below.    LMP 03/31/2014 (LMP Unknown)    CC: Hospital follow-up Subjective:   HPI: [name] is a 58 y.o. female presenting on 08/06/2024 for Hospitalization Follow-up   She presented to [facility] ED on 07/30/2024 for persistent headache behind both of her eyes with nausea, blurry vision, altered gait.  She tested negative for influenza, COVID-19, RSV.  UA was positive with leukocytes but she was asymptomatic.  CTA head was negative for large vessel occlusion and MRV.  She was treated with Phenergan  and Compazine  with little improvement.  She was admitted for further evaluation.  During her hospital stay neurology and neurosurgery consulted as MRI brain revealed expansile sellar/suprasellar mass favored to be pituitary macroadenoma.  A pituitary macroadenoma was bleeding which was presumed to be causing headaches.  She was treated with oral steroids, amitriptyline, gabapentin .  She was also noted to have tremors so amitriptyline was discontinued.  Symptoms improved so she was discharged home on 08/04/2024 with recommendations for neurosurgery follow-up, home health PT.  Since her discharge  home she continues to experience moderate to severe headaches to the frontal and temporal lobes.  She is also experience phonophobia, photophobia, nausea.  She has taken hydrocodone , steroids, methocarbamol  without improvement.  She was discharged home with gabapentin  but has yet to take it as one of her dialysis team members in the hospital told her she could not.  She has never tried Imitrex.  She is scheduled to see neurosurgery next Friday. She doesn't recall much from her hospital stay.   She does experience vaginal itching since an antibiotic that was prescribed on 07/18/2024 for UTI.  She completed her antibiotics recently.      Relevant past medical, surgical, family and social history reviewed and updated as indicated. Interim medical history since our last visit reviewed. Allergies and medications reviewed and updated. Outpatient Medications Prior to Visit  Medication Sig Dispense Refill   acetaminophen  (TYLENOL ) 500 MG tablet Take 1,000 mg by mouth every 6 (six) hours as needed for mild pain.     albuterol  (VENTOLIN  HFA) 108 (90 Base) MCG/ACT inhaler INHALE 2 PUFFS INTO THE LUNGS UP TO EVERY 6HRS AS NEEDED FOR WHEEZING/SHORTNESS OF BREATH 8 each 0   allopurinol  (ZYLOPRIM ) 100 MG tablet TAKE 1 TABLET (100 MG TOTAL) BY MOUTH DAILY. FOR GOUT PREVENTION 90 tablet 2   AURYXIA  1 GM 210 MG(Fe) tablet Take 210 mg by mouth 3 (three) times daily with meals.     busPIRone  (BUSPAR ) 5 MG tablet Take 1 tablet (5 mg total) by mouth 2 (two) times daily. For anxiety 180 tablet 0   dolutegravir  ([name] ) 50 MG tablet Take 1 tablet (50 mg total) by mouth daily. 30 tablet 11   emtricitabine -tenofovir   AF (DESCOVY ) 200-25 MG tablet Take 1 tablet by mouth daily. 30 tablet 11   fluticasone  (FLOVENT  HFA) 110 MCG/ACT inhaler Inhale 1 puff into the lungs 2 (two) times daily.     HYDROcodone -acetaminophen  (NORCO/VICODIN) 5-325 MG tablet Take 1 tablet by mouth every 6 (six) hours as needed. 20 tablet 0   Lancets  (ONETOUCH ULTRASOFT) lancets Use as instructed to test blood sugar daily 100 each 5   levocetirizine (XYZAL ) 5 MG tablet TAKE 1 TABLET BY MOUTH EVERY DAY IN THE EVENING FOR ALLERGIES 90 tablet 1   lidocaine  (LIDODERM ) 5 % PLACE 1 PATCH ONTO THE SKIN DAILY. REMOVE & DISCARD PATCH WITHIN 12 HOURS OR AS DIRECTED BY MD 30 patch 0   methocarbamol  (ROBAXIN ) 500 MG tablet Take 1 tablet (500 mg total) by mouth every 6 (six) hours as needed for muscle spasms. 20 tablet 0   methylPREDNISolone  (MEDROL  DOSEPAK) 4 MG TBPK tablet follow package directions 21 tablet 0   minocycline  (MINOCIN ) 100 MG capsule Take 1 capsule (100 mg total) by mouth 2 (two) times daily. 60 capsule 5   omeprazole  (PRILOSEC) 40 MG capsule Take 1 capsule (40 mg total) by mouth daily. for heartburn. 90 capsule 2   ondansetron  (ZOFRAN -ODT) 4 MG disintegrating tablet Take 1 tablet (4 mg total) by mouth every 8 (eight) hours as needed for nausea or vomiting. 20 tablet 0   rosuvastatin  (CRESTOR ) 10 MG tablet TAKE 1 TABLET BY MOUTH EVERY DAY FOR CHOLESTEROL 90 tablet 2   saxagliptin  HCl (ONGLYZA) 2.5 MG TABS tablet TAKE 1 TABLET (2.5 MG TOTAL) BY MOUTH DAILY. FOR DIABETES. 90 tablet 0   SYNTHROID  150 MCG tablet TAKE 1 TABLET BY MOUTH DAILY BEFORE BREAKFAST. 90 tablet 1   gabapentin  (NEURONTIN ) 100 MG capsule Take 2 capsules (200 mg total) by mouth at bedtime. (Patient not taking: Reported on 08/06/2024) 60 capsule 0   No facility-administered medications prior to visit.     Per HPI unless specifically indicated in ROS section below Review of Systems  Constitutional:  Negative for fever.  Eyes:  Positive for photophobia.  Respiratory:  Negative for cough.   Gastrointestinal:  Positive for nausea.  Neurological:  Positive for headaches.   Objective:  LMP 03/31/2014 (LMP Unknown)   Wt Readings from Last 3 Encounters:  08/02/24 184 lb 4.9 oz (83.6 kg)  06/27/24 174 lb (78.9 kg)  06/03/24 177 lb (80.3 kg)       Physical  exam: General: Alert and oriented x 3, no distress, does not appear sickly  Pulmonary: Speaks in complete sentences without increased work of breathing, no cough during visit.  Psychiatric: Normal mood, thought content, and behavior.     Results for orders placed or performed during the hospital encounter of 07/30/24  Resp panel by RT-PCR (RSV, Flu A&B, Covid) Anterior Nasal Swab   Collection Time: 07/30/24  6:27 PM   Specimen: Anterior Nasal Swab  Result Value Ref Range   SARS Coronavirus 2 by RT PCR NEGATIVE NEGATIVE   Influenza A by PCR NEGATIVE NEGATIVE   Influenza B by PCR NEGATIVE NEGATIVE   Resp Syncytial Virus by PCR NEGATIVE NEGATIVE  CBC with Differential/Platelet   Collection Time: 07/30/24  8:01 PM  Result Value Ref Range   WBC 16.1 (H) 4.0 - 10.5 K/uL   RBC 2.90 (L) 3.87 - 5.11 MIL/uL   Hemoglobin 10.8 (L) 12.0 - 15.0 g/dL   HCT 68.0 (L) 63.9 - 53.9 %   MCV 110.0 (H) 80.0 - 100.0  fL   MCH 37.2 (H) 26.0 - 34.0 pg   MCHC 33.9 30.0 - 36.0 g/dL   RDW 85.2 88.4 - 84.4 %   Platelets 200 150 - 400 K/uL   nRBC 0.0 0.0 - 0.2 %   Neutrophils Relative % 81 %   Neutro Abs 13.0 (H) 1.7 - 7.7 K/uL   Lymphocytes Relative 10 %   Lymphs Abs 1.6 0.7 - 4.0 K/uL   Monocytes Relative 9 %   Monocytes Absolute 1.4 (H) 0.1 - 1.0 K/uL   Eosinophils Relative 0 %   Eosinophils Absolute 0.0 0.0 - 0.5 K/uL   Basophils Relative 0 %   Basophils Absolute 0.0 0.0 - 0.1 K/uL   WBC Morphology See Note    RBC Morphology See Note    Smear Review See Note   Comprehensive metabolic panel   Collection Time: 07/30/24  8:11 PM  Result Value Ref Range   Sodium 136 135 - 145 mmol/L   Potassium 4.8 3.5 - 5.1 mmol/L   Chloride 93 (L) 98 - 111 mmol/L   CO2 23 22 - 32 mmol/L   Glucose, Bld 73 70 - 99 mg/dL   BUN 79 (H) 6 - 20 mg/dL   Creatinine, Ser 86.11 (H) 0.44 - 1.00 mg/dL   Calcium  8.3 (L) 8.9 - 10.3 mg/dL   Total Protein 6.4 (L) 6.5 - 8.1 g/dL   Albumin  2.6 (L) 3.5 - 5.0 g/dL   AST 62 (H)  15 - 41 U/L   ALT 90 (H) 0 - 44 U/L   Alkaline Phosphatase 157 (H) 38 - 126 U/L   Total Bilirubin 0.6 0.0 - 1.2 mg/dL   GFR, Estimated 3 (L) >60 mL/min   Anion gap 20 (H) 5 - 15  I-stat chem 8, ED (not at [facility], [facility] or [facility])   Collection Time: 07/30/24  8:19 PM  Result Value Ref Range   Sodium 136 135 - 145 mmol/L   Potassium 4.5 3.5 - 5.1 mmol/L   Chloride 97 (L) 98 - 111 mmol/L   BUN 78 (H) 6 - 20 mg/dL   Creatinine, Ser 86.39 (H) 0.44 - 1.00 mg/dL   Glucose, Bld 72 70 - 99 mg/dL   Calcium , Ion 1.03 (L) 1.15 - 1.40 mmol/L   TCO2 27 22 - 32 mmol/L   Hemoglobin 11.9 (L) 12.0 - 15.0 g/dL   HCT 64.9 (L) 63.9 - 53.9 %  CBG monitoring, ED   Collection Time: 07/31/24 12:46 PM  Result Value Ref Range   Glucose-Capillary 88 70 - 99 mg/dL  Glucose, capillary   Collection Time: 07/31/24  5:59 PM  Result Value Ref Range   Glucose-Capillary 70 70 - 99 mg/dL  Culture, blood (Routine X 2) w Reflex to ID Panel   Collection Time: 07/31/24  7:15 PM   Specimen: BLOOD LEFT HAND  Result Value Ref Range   Specimen Description BLOOD LEFT HAND    Special Requests      BOTTLES DRAWN AEROBIC AND ANAEROBIC Blood Culture adequate volume   Culture      NO GROWTH 5 DAYS Performed at [facility], [address], [place], [place] [address]    Report Status 08/05/2024 FINAL   T-helper cells (CD4) count (not at [facility])   Collection Time: 07/31/24  7:15 PM  Result Value Ref Range   CD4 T Cell Abs 445 400 - 1,790 /uL   CD4 % Helper T Cell 38 33 - 65 %  Hemoglobin A1c  Collection Time: 07/31/24  7:15 PM  Result Value Ref Range   Hgb A1c MFr Bld 6.1 (H) 4.8 - 5.6 %   Mean Plasma Glucose 128.37 mg/dL  Culture, blood (Routine X 2) w Reflex to ID Panel   Collection Time: 07/31/24  7:29 PM   Specimen: BLOOD RIGHT HAND  Result Value Ref Range   Specimen Description BLOOD RIGHT HAND    Special Requests      BOTTLES DRAWN AEROBIC AND ANAEROBIC Blood Culture adequate volume   Culture      NO GROWTH  5 DAYS Performed at [facility], [address] [place], [place] [address]    Report Status 08/05/2024 FINAL   Glucose, capillary   Collection Time: 07/31/24  9:20 PM  Result Value Ref Range   Glucose-Capillary 180 (H) 70 - 99 mg/dL  CBC   Collection Time: 08/01/24 12:33 AM  Result Value Ref Range   WBC 13.2 (H) 4.0 - 10.5 K/uL   RBC 2.73 (L) 3.87 - 5.11 MIL/uL   Hemoglobin 10.0 (L) 12.0 - 15.0 g/dL   HCT 70.2 (L) 63.9 - 53.9 %   MCV 108.8 (H) 80.0 - 100.0 fL   MCH 36.6 (H) 26.0 - 34.0 pg   MCHC 33.7 30.0 - 36.0 g/dL   RDW 84.8 88.4 - 84.4 %   Platelets 164 150 - 400 K/uL   nRBC 0.2 0.0 - 0.2 %  CBC   Collection Time: 08/01/24  2:29 AM  Result Value Ref Range   WBC 14.7 (H) 4.0 - 10.5 K/uL   RBC 2.75 (L) 3.87 - 5.11 MIL/uL   Hemoglobin 10.2 (L) 12.0 - 15.0 g/dL   HCT 69.6 (L) 63.9 - 53.9 %   MCV 110.2 (H) 80.0 - 100.0 fL   MCH 37.1 (H) 26.0 - 34.0 pg   MCHC 33.7 30.0 - 36.0 g/dL   RDW 85.1 88.4 - 84.4 %   Platelets 168 150 - 400 K/uL   nRBC 0.1 0.0 - 0.2 %  Comprehensive metabolic panel   Collection Time: 08/01/24  2:29 AM  Result Value Ref Range   Sodium 135 135 - 145 mmol/L   Potassium 3.7 3.5 - 5.1 mmol/L   Chloride 95 (L) 98 - 111 mmol/L   CO2 20 (L) 22 - 32 mmol/L   Glucose, Bld 106 (H) 70 - 99 mg/dL   BUN 81 (H) 6 - 20 mg/dL   Creatinine, Ser 85.89 (H) 0.44 - 1.00 mg/dL   Calcium  8.2 (L) 8.9 - 10.3 mg/dL   Total Protein 5.5 (L) 6.5 - 8.1 g/dL   Albumin  2.2 (L) 3.5 - 5.0 g/dL   AST 58 (H) 15 - 41 U/L   ALT 70 (H) 0 - 44 U/L   Alkaline Phosphatase 153 (H) 38 - 126 U/L   Total Bilirubin 0.7 0.0 - 1.2 mg/dL   GFR, Estimated 3 (L) >60 mL/min   Anion gap 20 (H) 5 - 15  Glucose, capillary   Collection Time: 08/01/24  6:21 AM  Result Value Ref Range   Glucose-Capillary 116 (H) 70 - 99 mg/dL  Glucose, capillary   Collection Time: 08/01/24 12:18 PM  Result Value Ref Range   Glucose-Capillary 185 (H) 70 - 99 mg/dL  Glucose, capillary   Collection Time:  08/01/24  3:25 PM  Result Value Ref Range   Glucose-Capillary 118 (H) 70 - 99 mg/dL   Comment 1 Notify RN    Comment 2 Document in Chart   TSH  Collection Time: 08/01/24  3:46 PM  Result Value Ref Range   TSH 0.231 (L) 0.350 - 4.500 uIU/mL  T4, free   Collection Time: 08/01/24  3:46 PM  Result Value Ref Range   Free T4 1.00 0.61 - 1.12 ng/dL  ACTH   Collection Time: 08/01/24  3:46 PM  Result Value Ref Range   C206 ACTH 15.2 7.2 - 63.3 pg/mL  Cortisol   Collection Time: 08/01/24  3:46 PM  Result Value Ref Range   Cortisol, Plasma 7.5 ug/dL  Follicle stimulating hormone   Collection Time: 08/01/24  3:46 PM  Result Value Ref Range   FSH 1.8 mIU/mL  Luteinizing hormone   Collection Time: 08/01/24  3:46 PM  Result Value Ref Range   LH <0.3 mIU/mL  Insulin -like growth factor   Collection Time: 08/01/24  3:46 PM  Result Value Ref Range   Somatomedin C 297 (H) 60 - 207 ng/mL  Prolactin   Collection Time: 08/01/24  3:46 PM  Result Value Ref Range   Prolactin 24.9 3.6 - 25.2 ng/mL  Glucose, capillary   Collection Time: 08/01/24  9:17 PM  Result Value Ref Range   Glucose-Capillary 121 (H) 70 - 99 mg/dL  Glucose, capillary   Collection Time: 08/01/24 11:06 PM  Result Value Ref Range   Glucose-Capillary 75 70 - 99 mg/dL  Glucose, capillary   Collection Time: 08/02/24 12:21 AM  Result Value Ref Range   Glucose-Capillary 81 70 - 99 mg/dL  Renal function panel   Collection Time: 08/02/24  5:25 AM  Result Value Ref Range   Sodium 133 (L) 135 - 145 mmol/L   Potassium 3.8 3.5 - 5.1 mmol/L   Chloride 93 (L) 98 - 111 mmol/L   CO2 19 (L) 22 - 32 mmol/L   Glucose, Bld 101 (H) 70 - 99 mg/dL   BUN 79 (H) 6 - 20 mg/dL   Creatinine, Ser 86.22 (H) 0.44 - 1.00 mg/dL   Calcium  8.1 (L) 8.9 - 10.3 mg/dL   Phosphorus 9.6 (H) 2.5 - 4.6 mg/dL   Albumin  2.1 (L) 3.5 - 5.0 g/dL   GFR, Estimated 3 (L) >60 mL/min   Anion gap 21 (H) 5 - 15  Glucose, capillary   Collection Time: 08/02/24   6:04 AM  Result Value Ref Range   Glucose-Capillary 127 (H) 70 - 99 mg/dL  Glucose, capillary   Collection Time: 08/02/24 12:47 PM  Result Value Ref Range   Glucose-Capillary 170 (H) 70 - 99 mg/dL  Glucose, capillary   Collection Time: 08/02/24  4:43 PM  Result Value Ref Range   Glucose-Capillary 163 (H) 70 - 99 mg/dL  Glucose, capillary   Collection Time: 08/02/24  8:52 PM  Result Value Ref Range   Glucose-Capillary 131 (H) 70 - 99 mg/dL  Glucose, capillary   Collection Time: 08/03/24  6:20 AM  Result Value Ref Range   Glucose-Capillary 135 (H) 70 - 99 mg/dL   Comment 1 Notify RN    Comment 2 Document in Chart   Glucose, capillary   Collection Time: 08/03/24 11:38 AM  Result Value Ref Range   Glucose-Capillary 116 (H) 70 - 99 mg/dL  Glucose, capillary   Collection Time: 08/03/24  4:26 PM  Result Value Ref Range   Glucose-Capillary 128 (H) 70 - 99 mg/dL  Glucose, capillary   Collection Time: 08/03/24  9:56 PM  Result Value Ref Range   Glucose-Capillary 245 (H) 70 - 99 mg/dL   Comment 1 Notify RN  Glucose, capillary   Collection Time: 08/04/24  6:28 AM  Result Value Ref Range   Glucose-Capillary 144 (H) 70 - 99 mg/dL   Comment 1 Notify RN   Glucose, capillary   Collection Time: 08/04/24 12:08 PM  Result Value Ref Range   Glucose-Capillary 188 (H) 70 - 99 mg/dL   *Note: Due to a large number of results and/or encounters for the requested time period, some results have not been displayed. A complete set of results can be found in Results Review.   Assessment & Plan:   Problem List Items Addressed This Visit       Cardiovascular and Mediastinum   Migraine   Secondary to hemorrhagic pituitary macroadenoma. Recent hospitalization notes, labs, imaging reviewed.  Since her headaches have persisted despite prescriptions mentioned in HPI, she will contact her kidney team to see if she may take Imitrex or gabapentin . Reconsider amitriptyline if warranted.  She does not  recall tremors.  Follow-up with neurosurgery as scheduled.        Genitourinary   Vaginal itching - Primary   Antibiotic induced likely. Start fluconazole  150 mg tablet x 1 dose.      Relevant Medications   fluconazole  (DIFLUCAN ) 150 MG tablet     Meds ordered this encounter  Medications   fluconazole  (DIFLUCAN ) 150 MG tablet    Sig: Take 1 tablet (150 mg total) by mouth once for 1 dose.    Dispense:  1 tablet    Refill:  0    Supervising Provider:   BEDSOLE, AMY E [[id number]]   No orders of the defined types were placed in this encounter.   I discussed the assessment and treatment plan with the patient. The patient was provided an opportunity to ask questions and all were answered. The patient agreed with the plan and demonstrated an understanding of the instructions. The patient was advised to call back or seek an in-person evaluation if the symptoms worsen or if the condition fails to improve as anticipated.  Follow up plan:  Please call your kidney team to see if you can take either Imitrex or gabapentin .  Follow-up with neurosurgery as scheduled.  It was a pleasure to see you today!   Felicia Seneca K Noriah Osgood, NP

## 2024-08-08 DIAGNOSIS — N186 End stage renal disease: Secondary | ICD-10-CM | POA: Diagnosis not present

## 2024-08-09 DIAGNOSIS — N186 End stage renal disease: Secondary | ICD-10-CM | POA: Diagnosis not present

## 2024-08-09 DIAGNOSIS — Z992 Dependence on renal dialysis: Secondary | ICD-10-CM | POA: Diagnosis not present

## 2024-08-10 DIAGNOSIS — N2581 Secondary hyperparathyroidism of renal origin: Secondary | ICD-10-CM | POA: Diagnosis not present

## 2024-08-10 DIAGNOSIS — Z992 Dependence on renal dialysis: Secondary | ICD-10-CM | POA: Diagnosis not present

## 2024-08-10 DIAGNOSIS — N186 End stage renal disease: Secondary | ICD-10-CM | POA: Diagnosis not present

## 2024-08-11 DIAGNOSIS — Z992 Dependence on renal dialysis: Secondary | ICD-10-CM | POA: Diagnosis not present

## 2024-08-11 DIAGNOSIS — N2581 Secondary hyperparathyroidism of renal origin: Secondary | ICD-10-CM | POA: Diagnosis not present

## 2024-08-11 DIAGNOSIS — N186 End stage renal disease: Secondary | ICD-10-CM | POA: Diagnosis not present

## 2024-08-12 ENCOUNTER — Telehealth: Payer: Self-pay

## 2024-08-12 DIAGNOSIS — N186 End stage renal disease: Secondary | ICD-10-CM | POA: Diagnosis not present

## 2024-08-12 NOTE — Telephone Encounter (Signed)
 Verbal orders have been given to Tonya.

## 2024-08-12 NOTE — Telephone Encounter (Signed)
 Copied from CRM #[id number]. Topic: Clinical - Home Health Verbal Orders >> Aug 12, 2024 10:20 AM Thersia BROCKS wrote: Caller/Agency: [facility] Callback Number: [phone] - able to leave message  Service Requested: Skilled Nursing Frequency: 1 week 4  Any new concerns about the patient? No

## 2024-08-13 ENCOUNTER — Ambulatory Visit: Admitting: Neurosurgery

## 2024-08-13 DIAGNOSIS — Z992 Dependence on renal dialysis: Secondary | ICD-10-CM | POA: Diagnosis not present

## 2024-08-13 DIAGNOSIS — N2581 Secondary hyperparathyroidism of renal origin: Secondary | ICD-10-CM | POA: Diagnosis not present

## 2024-08-13 DIAGNOSIS — N186 End stage renal disease: Secondary | ICD-10-CM | POA: Diagnosis not present

## 2024-08-14 DIAGNOSIS — Z992 Dependence on renal dialysis: Secondary | ICD-10-CM | POA: Diagnosis not present

## 2024-08-14 DIAGNOSIS — N2581 Secondary hyperparathyroidism of renal origin: Secondary | ICD-10-CM | POA: Diagnosis not present

## 2024-08-15 ENCOUNTER — Encounter: Payer: Self-pay | Admitting: Vascular Surgery

## 2024-08-15 ENCOUNTER — Telehealth: Payer: Self-pay | Admitting: Primary Care

## 2024-08-15 ENCOUNTER — Other Ambulatory Visit (HOSPITAL_COMMUNITY): Payer: Self-pay

## 2024-08-15 ENCOUNTER — Ambulatory Visit: Attending: Vascular Surgery | Admitting: Vascular Surgery

## 2024-08-15 ENCOUNTER — Ambulatory Visit (HOSPITAL_COMMUNITY)
Admission: RE | Admit: 2024-08-15 | Discharge: 2024-08-15 | Disposition: A | Discharge status: Home / Self Care | Source: Ambulatory Visit | Attending: Vascular Surgery | Admitting: Vascular Surgery

## 2024-08-15 VITALS — BP 95/53 | HR 90 | Temp 98.0°F | Resp 18 | Ht 65.0 in | Wt 184.0 lb

## 2024-08-15 DIAGNOSIS — N186 End stage renal disease: Secondary | ICD-10-CM

## 2024-08-15 DIAGNOSIS — I739 Peripheral vascular disease, unspecified: Secondary | ICD-10-CM | POA: Diagnosis not present

## 2024-08-15 DIAGNOSIS — Z9889 Other specified postprocedural states: Secondary | ICD-10-CM | POA: Diagnosis not present

## 2024-08-15 DIAGNOSIS — I7103 Dissection of thoracoabdominal aorta: Secondary | ICD-10-CM | POA: Diagnosis not present

## 2024-08-15 DIAGNOSIS — Z992 Dependence on renal dialysis: Secondary | ICD-10-CM | POA: Diagnosis not present

## 2024-08-15 DIAGNOSIS — I7141 Pararenal abdominal aortic aneurysm, without rupture: Secondary | ICD-10-CM

## 2024-08-15 DIAGNOSIS — Z8679 Personal history of other diseases of the circulatory system: Secondary | ICD-10-CM | POA: Diagnosis not present

## 2024-08-15 DIAGNOSIS — N2581 Secondary hyperparathyroidism of renal origin: Secondary | ICD-10-CM | POA: Diagnosis not present

## 2024-08-15 LAB — VAS US ABI WITH/WO TBI
Left ABI: 1.25
Right ABI: 1.27

## 2024-08-15 MED ORDER — GABAPENTIN 100 MG PO CAPS: 100.0000 mg | ORAL_CAPSULE | Freq: Three times a day (TID) | ORAL | 5 refills | Status: DC

## 2024-08-15 MED ORDER — GABAPENTIN 100 MG PO CAPS
100.0000 mg | ORAL_CAPSULE | Freq: Three times a day (TID) | ORAL | 5 refills | Status: DC
  Filled 2024-08-15: qty 100, 34d supply, fill #0

## 2024-08-15 NOTE — Telephone Encounter (Signed)
 Copied from CRM [id number]. Topic: Clinical - Home Health Verbal Orders >> Aug 15, 2024  3:56 PM Pinkey ORN wrote: Caller/Agency: [facility]  Callback Number: [phone] Service Requested: Physical Therapy Frequency: 1 x 1 , 2 x 2 , 1 x 1  Any new concerns about the patient? No

## 2024-08-15 NOTE — Progress Notes (Signed)
 POST OPERATIVE OFFICE NOTE   HPI:  [name] is a 58 y.o. 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  Incisional disease with peritoneal dialysis catheter placed by my partner Dr. Gala.  At the time of her last follow-up, [name] had severe abdominal pain resulting in ED evaluation.  She had no major changes to the aorta, therefore was discharged.  In the interim, she has been seen again, and found to have a pituitary mass.  Furthermore, she has had issues ambulating due to severe pain in bilateral feet.  On exam, [name] was doing well, accompanied by her niece and grand nephew.  She was all smiles, and very appreciative of all the care that she has had.  She has an appointment tomorrow with neurology to discuss her pituitary tumor.  Regarding the lower extremities, she was seen by neurosurgery, and the pain in the feet was not related to her back.  She describes that every time she takes a step it feels like pins-and-needles on the bottom of her feet.  States the pain radiates from her foot to her ankles bilaterally.  Both feet are affected to the same degree.  Denies symptoms of claudication, ischemic rest pain, tissue loss.   Allergies  Allergen Reactions   Genvoya [Elviteg-Cobic-Emtricit-Tenofaf] Hives   Lisinopril Cough   Aldactone  [Spironolactone ] Hives   Valium [Diazepam] Other (See Comments)    Lethargy (pt states she cannot take this again)   Tegaderm Ag Mesh [Silver] Itching    Current Outpatient Medications  Medication Sig Dispense Refill   acetaminophen  (TYLENOL ) 500 MG tablet Take 1,000 mg by mouth every 6 (six) hours as needed for mild pain.     albuterol  (VENTOLIN  HFA) 108 (90 Base) MCG/ACT inhaler INHALE 2 PUFFS INTO THE LUNGS UP TO EVERY 6HRS AS NEEDED FOR WHEEZING/SHORTNESS OF BREATH 8 each 0   allopurinol  (ZYLOPRIM ) 100 MG  tablet TAKE 1 TABLET (100 MG TOTAL) BY MOUTH DAILY. FOR GOUT PREVENTION 90 tablet 2   AURYXIA  1 GM 210 MG(Fe) tablet Take 210 mg by mouth 3 (three) times daily with meals.     busPIRone  (BUSPAR ) 5 MG tablet Take 1 tablet (5 mg total) by mouth 2 (two) times daily. For anxiety 180 tablet 0   calcitRIOL (ROCALTROL) 0.25 MCG capsule Take 0.25 mcg by mouth 3 (three) times a week.     dolutegravir  ([name] ) 50 MG tablet Take 1 tablet (50 mg total) by mouth daily. 30 tablet 11   emtricitabine -tenofovir  AF (DESCOVY ) 200-25 MG tablet Take 1 tablet by mouth daily. 30 tablet 11   fluticasone  (FLOVENT  HFA) 110 MCG/ACT inhaler Inhale 1 puff into the lungs 2 (two) times daily.     HYDROcodone -acetaminophen  (NORCO/VICODIN) 5-325 MG tablet Take 1 tablet by mouth every 6 (six) hours as needed. 20 tablet 0   Lancets (ONETOUCH ULTRASOFT) lancets Use as instructed to test blood sugar daily 100 each 5   levocetirizine (XYZAL ) 5 MG tablet TAKE 1 TABLET BY MOUTH EVERY DAY IN THE EVENING FOR ALLERGIES 90 tablet 1   lidocaine  (LIDODERM ) 5 % PLACE 1 PATCH ONTO THE SKIN DAILY. REMOVE & DISCARD PATCH WITHIN 12 HOURS OR AS DIRECTED BY MD 30 patch 0   losartan  (COZAAR ) 100 MG tablet Take 100 mg by mouth daily.     losartan  (COZAAR ) 50 MG tablet Take 50 mg by mouth daily.  methocarbamol  (ROBAXIN ) 500 MG tablet Take 1 tablet (500 mg total) by mouth every 6 (six) hours as needed for muscle spasms. 20 tablet 0   methylPREDNISolone  (MEDROL  DOSEPAK) 4 MG TBPK tablet follow package directions 21 tablet 0   minocycline  (MINOCIN ) 100 MG capsule Take 1 capsule (100 mg total) by mouth 2 (two) times daily. 60 capsule 5   omeprazole  (PRILOSEC) 40 MG capsule Take 1 capsule (40 mg total) by mouth daily. for heartburn. 90 capsule 2   ondansetron  (ZOFRAN -ODT) 4 MG disintegrating tablet Take 1 tablet (4 mg total) by mouth every 8 (eight) hours as needed for nausea or vomiting. 20 tablet 0   rosuvastatin  (CRESTOR ) 10 MG tablet TAKE 1 TABLET BY  MOUTH EVERY DAY FOR CHOLESTEROL 90 tablet 2   saxagliptin  HCl (ONGLYZA) 2.5 MG TABS tablet TAKE 1 TABLET (2.5 MG TOTAL) BY MOUTH DAILY. FOR DIABETES. 90 tablet 0   sevelamer carbonate (RENVELA) 800 MG tablet Take by mouth.     SYNTHROID  150 MCG tablet TAKE 1 TABLET BY MOUTH DAILY BEFORE BREAKFAST. 90 tablet 1   gabapentin  (NEURONTIN ) 100 MG capsule Take 2 capsules (200 mg total) by mouth at bedtime. (Patient not taking: Reported on 08/15/2024) 60 capsule 0   No current facility-administered medications for this visit.     ROS:  See HPI  Physical Exam:  Physical Exam Constitutional:      Appearance: Normal appearance. She is normal weight.  HENT:     Head: Normocephalic.     Nose: Nose normal.  Eyes:     Pupils: Pupils are equal, round, and reactive to light.  Cardiovascular:     Rate and Rhythm: Normal rate and regular rhythm.     Pulses: Normal pulses.  Pulmonary:     Effort: Pulmonary effort is normal.  Abdominal:     General: Abdomen is flat.     Palpations: Abdomen is soft.  Musculoskeletal:        General: Normal range of motion.     Cervical back: No rigidity.  Skin:    General: Skin is warm and dry.  Neurological:     Mental Status: She is alert.      Assessment/Plan:  This is a 58 y.o. female who presents today for follow-up.  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  Descending thoracic aorta has decreased in size. Patient has dissection that continues into the left iliac and common femoral.  Fortunately, she continues to have a palpable pulse in the feet bilaterally.  ABI was reviewed.  This was normal, however judging by waveforms, it does did note some amount of inflow disease which is depicted on the CT scan.  Regarding the lower extremities, I do not have a reason as to why she is having a pins and needle sensation.  She is on dialysis, which could be playing a role.  I have sent  a referral to a colleague, Dr. Juliene Medicine D.P.M. for evaluation.  I am worried that this is neuropathy.  She lost her prescription for Neurontin , and therefore I reordered it 100 3 times daily.  My plan is to see [name] in 1 years time.  She was asked to call should any questions or concerns arise in the interim

## 2024-08-15 NOTE — Telephone Encounter (Unsigned)
 Copied from CRM #[id number]. Topic: Clinical - Prescription Issue >> Aug 15, 2024 12:55 PM Harlene ORN wrote: Reason for CRM: Nurse [facility]  Nurse left a message this morning. Her hospital gave her gabepetin. Was told before she left the hospital to discontinue the gabepetin because of her kidneys. Nurse spoke with her dialysis and they said it was okay for her to take a gabepetin. The problem now is that she now needs a refill becuase she threw it away. Please send refill.  Phone: [phone]

## 2024-08-15 NOTE — Telephone Encounter (Signed)
 Copied from CRM #[id number]. Topic: Clinical - Medication Question >> Aug 15, 2024 11:16 AM Dedra B wrote: Reason for CRM:  Pt was prescribed Gabapentin  while in the hospital. However, a nephrologist that saw her while in the hospital told her not to take it. Pt wants to know whether or not she should take it.

## 2024-08-16 ENCOUNTER — Ambulatory Visit: Admitting: Neurosurgery

## 2024-08-16 ENCOUNTER — Encounter: Payer: Self-pay | Admitting: Neurosurgery

## 2024-08-16 ENCOUNTER — Telehealth: Payer: Self-pay

## 2024-08-16 ENCOUNTER — Telehealth: Payer: Self-pay | Admitting: *Deleted

## 2024-08-16 VITALS — BP 105/67 | HR 68 | Temp 98.2°F | Ht 65.0 in | Wt 181.2 lb

## 2024-08-16 DIAGNOSIS — N186 End stage renal disease: Secondary | ICD-10-CM | POA: Diagnosis not present

## 2024-08-16 DIAGNOSIS — Z992 Dependence on renal dialysis: Secondary | ICD-10-CM | POA: Diagnosis not present

## 2024-08-16 DIAGNOSIS — E236 Other disorders of pituitary gland: Secondary | ICD-10-CM | POA: Diagnosis not present

## 2024-08-16 DIAGNOSIS — N2581 Secondary hyperparathyroidism of renal origin: Secondary | ICD-10-CM | POA: Diagnosis not present

## 2024-08-16 DIAGNOSIS — E1122 Type 2 diabetes mellitus with diabetic chronic kidney disease: Secondary | ICD-10-CM | POA: Diagnosis not present

## 2024-08-16 NOTE — Progress Notes (Signed)
 58 year old lady with a history of breast cancer who was admitted to our hospital couple weeks ago with a spontaneous worst headache of her life with visual decline.  When I saw her, her vision was getting better and the MRI showed a pituitary apoplexy.  Lab work done at that time showed that all her hormones were intact and the IGF-I was elevated due to 97 [upper margin should be below 205].  She returns with her sister today and says that her headaches have completely resolved.  She was having nausea long before this event and attributed that to her chemo treatment from her breast cancer.    On today's exam her visual fields are completely intact.  Her blood pressure is 105/67 and she says that over the past 3 days it has been running low.  She has not taken her blood pressure medication.  I reviewed with her and her sister what a pituitary gland is and what tumors of the pituitary gland typically behave like.  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  Although it is possible that this could have been a breast cancer metastasis to the pituitary gland which caused the hemorrhage, it is unlikely.  Typically, oncological diseases in the pituitary gland can result in pituitary dysfunction and her lab work was nearly completely normal.  There was no decline in the pituitary function despite the apoplexy.  I told her that if she was my wife, I would like to repeat the lab work today.  With her low blood pressure, it is theoretically possible that she has too low cortisol and is heading into an addisonian crisis.  I will also check the sodium and have ordered the BMP.  I will have a phone call with her next week.  She voiced understanding that if she starts feeling worse, to go to the emergency room.  I will also check the other hormones.  I plan on getting a repeat MRI of the pituitary in December.

## 2024-08-16 NOTE — Telephone Encounter (Signed)
 Called pt and notify her of MRI appointment with [facility] on September 21, 2024 @ 1:20pm. Pt accepted the appointment.

## 2024-08-16 NOTE — Telephone Encounter (Signed)
 [facility] contacted our office requesting medical clearance for a dental procedure. Advised that patient was released surgically from our office in 2024. Advised to contact patient's PCP for medical clearance.

## 2024-08-16 NOTE — Telephone Encounter (Signed)
 I have called patient patient she was able to reach out to cardiology and they advised that she restart. They have called that in for her. No further action needed with our office.

## 2024-08-16 NOTE — Telephone Encounter (Signed)
 Duplicate message I have spoke to patient reached out to cardiology and they have advised her to start back.

## 2024-08-17 DIAGNOSIS — N2581 Secondary hyperparathyroidism of renal origin: Secondary | ICD-10-CM | POA: Diagnosis not present

## 2024-08-17 DIAGNOSIS — Z992 Dependence on renal dialysis: Secondary | ICD-10-CM | POA: Diagnosis not present

## 2024-08-17 DIAGNOSIS — N186 End stage renal disease: Secondary | ICD-10-CM | POA: Diagnosis not present

## 2024-08-17 NOTE — Telephone Encounter (Signed)
 Noted

## 2024-08-19 DIAGNOSIS — Z992 Dependence on renal dialysis: Secondary | ICD-10-CM | POA: Diagnosis not present

## 2024-08-19 DIAGNOSIS — N2581 Secondary hyperparathyroidism of renal origin: Secondary | ICD-10-CM | POA: Diagnosis not present

## 2024-08-19 DIAGNOSIS — N186 End stage renal disease: Secondary | ICD-10-CM | POA: Diagnosis not present

## 2024-08-20 DIAGNOSIS — N2581 Secondary hyperparathyroidism of renal origin: Secondary | ICD-10-CM | POA: Diagnosis not present

## 2024-08-20 DIAGNOSIS — N186 End stage renal disease: Secondary | ICD-10-CM | POA: Diagnosis not present

## 2024-08-20 DIAGNOSIS — Z992 Dependence on renal dialysis: Secondary | ICD-10-CM | POA: Diagnosis not present

## 2024-08-21 ENCOUNTER — Telehealth: Payer: Self-pay

## 2024-08-21 NOTE — Addendum Note (Signed)
 Addended by: DARIO JESSLYN BROCKS on: 08/21/2024 09:49 AM   Modules accepted: Orders

## 2024-08-22 DIAGNOSIS — N2581 Secondary hyperparathyroidism of renal origin: Secondary | ICD-10-CM | POA: Diagnosis not present

## 2024-08-22 DIAGNOSIS — Z992 Dependence on renal dialysis: Secondary | ICD-10-CM | POA: Diagnosis not present

## 2024-08-23 ENCOUNTER — Telehealth: Admitting: Neurosurgery

## 2024-08-23 DIAGNOSIS — N186 End stage renal disease: Secondary | ICD-10-CM | POA: Diagnosis not present

## 2024-08-25 DIAGNOSIS — N186 End stage renal disease: Secondary | ICD-10-CM | POA: Diagnosis not present

## 2024-08-25 DIAGNOSIS — N2581 Secondary hyperparathyroidism of renal origin: Secondary | ICD-10-CM | POA: Diagnosis not present

## 2024-08-25 DIAGNOSIS — Z992 Dependence on renal dialysis: Secondary | ICD-10-CM | POA: Diagnosis not present

## 2024-08-26 ENCOUNTER — Other Ambulatory Visit: Payer: Self-pay | Admitting: Primary Care

## 2024-08-26 DIAGNOSIS — E236 Other disorders of pituitary gland: Secondary | ICD-10-CM | POA: Diagnosis not present

## 2024-08-26 DIAGNOSIS — D352 Benign neoplasm of pituitary gland: Secondary | ICD-10-CM | POA: Diagnosis not present

## 2024-08-26 DIAGNOSIS — F411 Generalized anxiety disorder: Secondary | ICD-10-CM

## 2024-08-27 ENCOUNTER — Telehealth: Payer: Self-pay | Admitting: Primary Care

## 2024-08-27 ENCOUNTER — Telehealth: Payer: Self-pay

## 2024-08-27 DIAGNOSIS — E1122 Type 2 diabetes mellitus with diabetic chronic kidney disease: Secondary | ICD-10-CM | POA: Diagnosis not present

## 2024-08-27 DIAGNOSIS — I120 Hypertensive chronic kidney disease with stage 5 chronic kidney disease or end stage renal disease: Secondary | ICD-10-CM | POA: Diagnosis not present

## 2024-08-27 DIAGNOSIS — N186 End stage renal disease: Secondary | ICD-10-CM | POA: Diagnosis not present

## 2024-08-27 DIAGNOSIS — G8929 Other chronic pain: Secondary | ICD-10-CM | POA: Diagnosis not present

## 2024-08-27 DIAGNOSIS — D352 Benign neoplasm of pituitary gland: Secondary | ICD-10-CM | POA: Diagnosis not present

## 2024-08-27 DIAGNOSIS — M1A9XX0 Chronic gout, unspecified, without tophus (tophi): Secondary | ICD-10-CM | POA: Diagnosis not present

## 2024-08-27 DIAGNOSIS — L732 Hidradenitis suppurativa: Secondary | ICD-10-CM | POA: Diagnosis not present

## 2024-08-27 DIAGNOSIS — N2581 Secondary hyperparathyroidism of renal origin: Secondary | ICD-10-CM | POA: Diagnosis not present

## 2024-08-27 DIAGNOSIS — D869 Sarcoidosis, unspecified: Secondary | ICD-10-CM | POA: Diagnosis not present

## 2024-08-27 DIAGNOSIS — D630 Anemia in neoplastic disease: Secondary | ICD-10-CM | POA: Diagnosis not present

## 2024-08-27 DIAGNOSIS — G43909 Migraine, unspecified, not intractable, without status migrainosus: Secondary | ICD-10-CM | POA: Diagnosis not present

## 2024-08-27 DIAGNOSIS — Z992 Dependence on renal dialysis: Secondary | ICD-10-CM | POA: Diagnosis not present

## 2024-08-27 NOTE — Telephone Encounter (Signed)
 Copied from CRM [id number]. Topic: Clinical - Home Health Verbal Orders >> Aug 27, 2024 10:43 AM Alfonso HERO wrote: Caller/Agency: [facility] Callback Number: [phone] Service Requested: Physical Therapy Eval Frequency: pending Eval Any new concerns about the patient? No

## 2024-08-27 NOTE — Telephone Encounter (Signed)
 Approved.

## 2024-08-27 NOTE — Telephone Encounter (Signed)
 Patient called stating she attempted to fill Descovy  on [facility] app, but noticed message stating medication was denied. She did leave a message with pharmacy regarding this. Also requested providers staff reach out to [facility].  Spoke with [facility] who will work on filling prescription.  Lorenda CHRISTELLA Code, RMA

## 2024-08-28 ENCOUNTER — Other Ambulatory Visit: Payer: Self-pay

## 2024-08-28 ENCOUNTER — Ambulatory Visit: Admitting: Neurology

## 2024-08-28 ENCOUNTER — Other Ambulatory Visit: Payer: Self-pay | Admitting: Primary Care

## 2024-08-28 ENCOUNTER — Telehealth: Payer: Self-pay

## 2024-08-28 DIAGNOSIS — N2581 Secondary hyperparathyroidism of renal origin: Secondary | ICD-10-CM | POA: Diagnosis not present

## 2024-08-28 DIAGNOSIS — Z992 Dependence on renal dialysis: Secondary | ICD-10-CM | POA: Diagnosis not present

## 2024-08-28 DIAGNOSIS — R110 Nausea: Secondary | ICD-10-CM

## 2024-08-28 DIAGNOSIS — N186 End stage renal disease: Secondary | ICD-10-CM | POA: Diagnosis not present

## 2024-08-28 MED ORDER — ONDANSETRON 4 MG PO TBDP: 4.0000 mg | ORAL_TABLET | Freq: Three times a day (TID) | ORAL | 0 refills | Status: DC | PRN

## 2024-08-28 NOTE — Telephone Encounter (Signed)
 Spoke with [name] of [facility], informing [name] Mallie is giving verbal orders for services requested. Expresses his thanks and states he will go see pt today.

## 2024-08-28 NOTE — Patient Outreach (Signed)
 Complex Care Management   Visit Note  08/28/2024  Name:  [name] MRN: [id number] DOB: 1966-04-18  Situation: Referral received for Complex Care Management related to ESRD and Diabetes with Complications I obtained verbal consent from Patient.  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  Genes Analyzed (43 total): APC*, ATM*, AXIN2, BARD1, BMPR1A, BRCA1*, BRCA2*, BRIP1*, CDH1*, CDK4, CDKN2A, CHEK2*, DICER1, GALNT12, HOXB13, MEN1, MLH1*, MRE11A, MSH2*, MSH3, MSH6*, MUTYH*, NBN, NF1*, NTHL1, PALB2*, PMS2*, POLD1, POLE, PTEN*, RAD50, RAD51C*, RAD51D*, RET, SDHB, SDHD, SMAD4, SMARCA4, STK11 and TP53* (sequencing and deletion/duplication); EGFR (s   GERD 07/24/2006   GRAVE'S DISEASE 01/01/2008   History of hidradenitis suppurativa     History of kidney stones    History of thrush    HIV DISEASE 07/24/2006   dx March 05   Hyperlipidemia    HYPERTENSION 07/24/2006   Hyperthyroidism 08/2006   Grave's Disease -diffuse radiotracer uptake 08/25/06 Thyroid  scan-Cold nodule to R lower lobe of thyrorid   Ileus (HCC) 02/14/2023   Left bundle branch block (LBBB)    Malnutrition of moderate degree 02/08/2023   Menometrorrhagia    hx of   Nephrolithiasis    Panniculitis 05/12/2020   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 Completed 2 yrs Prednisone  after lung bx confirmation. No symptoms since then.   Sebaceous cyst 04/21/2020   Suppurative hidradenitis    Thyroid  cancer (HCC)    THYROID  NODULE, RIGHT 02/08/2007    Assessment: Patient Reported Symptoms:  Cognitive Cognitive Status: Struggling with memory recall, Alert and oriented to person, place, and time, Insightful and able to interpret abstract concepts, Normal speech and language skills (forgetful - reports side effect from  pituitary tumor, no safety issues, mostly related to conversations) Cognitive/Intellectual Conditions Management [RPT]: None reported or documented in medical history or problem list   Health Maintenance Behaviors: Annual physical exam  Neurological Neurological Review of Symptoms: Numbness Neurological Comment: feet tingle pain and numbness referred to podiatry for  gabapentin  refill, no longer with headaches  HEENT HEENT Symptoms Reported: Other: (vision change states told relatd to tumor) HEENT Management Strategies: Routine screening    Cardiovascular Cardiovascular Symptoms Reported: No symptoms reported Does patient have uncontrolled Hypertension?: No Weight: 178 lb (80.7 kg) Cardiovascular Comment: 124/56 - BP has been running low,  Respiratory Respiratory Symptoms Reported: No symptoms reported    Endocrine Endocrine Symptoms Reported: No symptoms reported Is patient diabetic?: Yes Is patient checking blood sugars at home?: Yes List most recent blood sugar readings, include date and time of day: FBG 81 ranging 80-110 no low/high    Gastrointestinal Gastrointestinal Symptoms Reported: Nausea Additional Gastrointestinal Details: makes self eat, not losing weight since prednisone       Genitourinary Genitourinary Symptoms Reported: No symptoms reported Additional Genitourinary Details: no issues with peritoneal dialysis at home    Integumentary Additional Integumentary Details: no areaa of concern    Musculoskeletal Musculoskelatal Symptoms Reviewed: Back pain, Difficulty walking, Joint pain, Limited mobility, Muscle pain Additional Musculoskeletal Details: back pain and spasms - methocarbamol  helps, heat, tylenol , out of Norco requeting refill Musculoskeletal Management Strategies: Medication therapy, Routine screening Falls in the past year?: No Number of falls in past year: 1 or less Was there an injury with Fall?: No Fall Risk Category Calculator: 0 Patient Fall Risk Level: Low Fall Risk Patient at Risk for Falls Due to: Impaired mobility, Orthopedic patient Fall risk Follow up: Falls evaluation completed, Falls prevention discussed  Psychosocial Psychosocial Symptoms Reported: Anxiety - if selected complete GAD Additional Psychological Details: no longer with hallucinations, anxiety manages with deep breaths, declines LCSW           08/28/2024    PHQ2-9 Depression Screening   Little interest or pleasure in doing things Several days  Feeling down, depressed, or hopeless Not at all  PHQ-2 - Total Score 1  Trouble falling or staying asleep, or sleeping too much    Feeling tired or having little energy    Poor appetite or overeating     Feeling bad about yourself - or that you are a failure or have let yourself or your family down    Trouble concentrating on things, such as reading the newspaper or watching television    Moving or speaking so slowly that other people could have noticed.  Or the opposite - being so fidgety or restless that you have been moving around a lot more than usual    Thoughts that you would be better off dead, or hurting yourself in some way    PHQ2-9 Total Score    If you checked off any problems, how difficult have these problems made it for you to do your work, take care of things at home, or get along with other people    Depression Interventions/Treatment      There were no vitals filed for this visit.    Medications Reviewed Today     Reviewed by Devra Lands, RN (Registered Nurse) on 08/28/24 at 1438  Med List Status: <None>   Medication Order Taking? Sig Documenting Provider Last Dose Status Informant  acetaminophen  (TYLENOL ) 500 MG tablet [id number] Yes Take 1,000 mg by mouth every 6 (six) hours as needed for  mild pain. [provider]  Active Self  albuterol  (VENTOLIN  HFA) 108 (90 Base) MCG/ACT inhaler [id number] Yes INHALE 2 PUFFS INTO THE LUNGS UP TO EVERY 6HRS AS NEEDED FOR WHEEZING/SHORTNESS OF BREATH Clark, Katherine K, NP  Active Self  allopurinol  (ZYLOPRIM ) 100 MG tablet [id number] Yes TAKE 1 TABLET (100 MG TOTAL) BY MOUTH DAILY. FOR GOUT PREVENTION Clark, Katherine K, NP  Active Self  AURYXIA  1 GM 210 MG(Fe) tablet [id number] Yes Take 210 mg by mouth 3 (three) times daily with meals. [provider]  Active Self           Med Note (SATTERFIELD, TEENA BRAVO    Tue Jul 30, 2024  6:32 PM) Patient verified she is still taking this medication   busPIRone  (BUSPAR ) 5 MG tablet [id number] Yes TAKE 1 TABLET BY MOUTH TWICE A DAY FOR ANXIETY Clark, Katherine K, NP  Active   calcitRIOL (ROCALTROL) 0.25 MCG capsule [id number] Yes Take 0.25 mcg by mouth 3 (three) times a week. [provider]  Active   dolutegravir  ([name] ) 50 MG tablet [id number] Yes Take 1 tablet (50 mg total) by mouth daily. Melvenia Corean SAILOR, NP  Active Self  emtricitabine -tenofovir  AF (DESCOVY ) 200-25 MG tablet [id number] Yes Take 1 tablet by mouth daily. Melvenia Corean SAILOR, NP  Active Self  fluticasone  (FLOVENT  HFA) 110 MCG/ACT inhaler [id number] Yes Inhale 1 puff into the lungs 2 (two) times daily. [provider]  Active Self           Med Note KERRIN, MELISSA R   Fri Dec 15, 2023 12:45 PM)    gabapentin  (NEURONTIN ) 100 MG capsule [id number]  Take 1 capsule (100 mg total) by mouth 3 (three) times daily.  Patient not taking: Reported on 08/28/2024   Lanis Fonda BRAVO, MD  Active   HYDROcodone -acetaminophen  (NORCO/VICODIN) 5-325 MG tablet [id number]  Take 1 tablet by mouth every 6 (six) hours as needed.  Patient not taking: Reported on 08/28/2024   Arlice Reichert, MD  Active   Lancets [facility] ULTRASOFT) lancets [id number] Yes Use as instructed to test blood sugar daily Clark, Katherine K, NP  Active Self  levocetirizine (XYZAL ) 5 MG tablet [id number] Yes TAKE 1 TABLET BY MOUTH EVERY DAY IN THE EVENING FOR ALLERGIES Clark, Katherine K, NP  Active Self  lidocaine  (LIDODERM ) 5 % [id number]  PLACE 1 PATCH ONTO THE SKIN DAILY. REMOVE & DISCARD PATCH WITHIN 12 HOURS OR AS DIRECTED BY MD  Patient not taking: Reported on 08/28/2024   Gretta Comer POUR, NP  Active Self  losartan  (COZAAR ) 100 MG tablet [id number]  Take 100 mg by mouth daily.  Patient not taking: Reported on 08/28/2024   [provider]  Active   losartan  (COZAAR ) 50 MG tablet [id number]  Take 50 mg by mouth  daily.  Patient not taking: Reported on 08/28/2024   [provider]  Active   methocarbamol  (ROBAXIN ) 500 MG tablet [id number] Yes Take 1 tablet (500 mg total) by mouth every 6 (six) hours as needed for muscle spasms. Arlice Reichert, MD  Active   methylPREDNISolone  (MEDROL  DOSEPAK) 4 MG TBPK tablet [id number]  follow package directions  Patient not taking: Reported on 08/28/2024   Arlice Reichert, MD  Active   minocycline  (MINOCIN ) 100 MG capsule [id number] Yes Take 1 capsule (100 mg total) by mouth 2 (two) times daily. Melvenia Corean SAILOR, NP  Active Self  omeprazole  (PRILOSEC) 40 MG capsule [id number] Yes Take 1 capsule (40 mg total) by mouth daily. for heartburn. Gretta,  Katherine K, NP  Active Self  ondansetron  (ZOFRAN -ODT) 4 MG disintegrating tablet [id number] Yes Take 1 tablet (4 mg total) by mouth every 8 (eight) hours as needed for nausea or vomiting. Clark, Katherine K, NP  Active Self  rosuvastatin  (CRESTOR ) 10 MG tablet [id number] Yes TAKE 1 TABLET BY MOUTH EVERY DAY FOR CHOLESTEROL Clark, Katherine K, NP  Active Self  saxagliptin  HCl (ONGLYZA) 2.5 MG TABS tablet [id number] Yes TAKE 1 TABLET (2.5 MG TOTAL) BY MOUTH DAILY. FOR DIABETES. Clark, Katherine K, NP  Active Self  sevelamer carbonate (RENVELA) 800 MG tablet [id number] Yes Take by mouth. [provider]  Active   SYNTHROID  150 MCG tablet [id number] Yes TAKE 1 TABLET BY MOUTH DAILY BEFORE BREAKFAST. Trixie File, MD  Active Self            Recommendation:   PCP Follow-up Specialty provider follow-up Neurosurgeon Continue Current Plan of Care  Follow Up Plan:   Telephone follow-up in 1 month  Nestora Duos, MSN, RN [facility], [facility] RN Care Manager Direct Dial: [phone] Fax: [phone]'

## 2024-08-28 NOTE — Patient Instructions (Signed)
 Visit Information  Thank you for taking time to visit with me today. Please don't hesitate to contact me if I can be of assistance to you before our next scheduled appointment.  Your next care management appointment is by telephone on 09/25/2024 at 1:00 pm  Telephone follow-up in 1 month  Please call the care guide team at [phone] if you need to cancel, schedule, or reschedule an appointment.   Please call the Suicide and Crisis Lifeline: 988 call the [place]  National Suicide Prevention Lifeline: 269-678-1345 or TTY: 7126447459 TTY 5024968316) to talk to a trained counselor call 1-800-273-TALK (toll free, 24 hour hotline) go to [facility] [address] [place] [phone]) call 911 if you are experiencing a Mental Health or Behavioral Health Crisis or need someone to talk to.  Nestora Duos, MSN, RN [facility], [facility] RN Care Manager Direct Dial: [phone] Fax: [phone]

## 2024-08-29 DIAGNOSIS — N2581 Secondary hyperparathyroidism of renal origin: Secondary | ICD-10-CM | POA: Diagnosis not present

## 2024-08-29 DIAGNOSIS — Z992 Dependence on renal dialysis: Secondary | ICD-10-CM | POA: Diagnosis not present

## 2024-08-30 ENCOUNTER — Telehealth: Payer: Self-pay

## 2024-08-30 ENCOUNTER — Telehealth: Payer: Self-pay | Admitting: Neurosurgery

## 2024-08-30 ENCOUNTER — Ambulatory Visit: Admitting: Neurosurgery

## 2024-08-30 DIAGNOSIS — E236 Other disorders of pituitary gland: Secondary | ICD-10-CM

## 2024-08-30 DIAGNOSIS — N2581 Secondary hyperparathyroidism of renal origin: Secondary | ICD-10-CM | POA: Diagnosis not present

## 2024-08-30 MED ORDER — METHOCARBAMOL 500 MG PO TABS: 500.0000 mg | ORAL_TABLET | Freq: Four times a day (QID) | ORAL | 3 refills | Status: DC | PRN

## 2024-08-30 NOTE — Progress Notes (Signed)
 Virtual Visit via Telephone Note  I connected with [name] on 08/30/24 at  3:00 PM EST by telephone and verified that I am speaking with the correct person using two identifiers.  Location: Patient: Home Provider: Clinic   I discussed the limitations, risks, security and privacy concerns of performing an evaluation and management service by telephone and the availability of in person appointments. I also discussed with the patient that there may be a patient responsible charge related to this service. The patient expressed understanding and agreed to proceed.   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  She says that she does not have any significant headaches but does need her used to use her glasses more often.  When she does wear her glasses, her vision clears up considerably.  At her last visit we got some lab work done and her cortisol was 15 and her sodium and potassium normal.  She says that she is very tired and she has seen her primary care doctor.  At her request I renewed the Robaxin .  I again explained to her that we will need to keep an eye on the pituitary gland and to that end, I have ordered an MRI for December.  After this is done she is known to come and see me.  She acknowledges that if she starts having worsening headaches or visual loss, to go to the emergency room immediately.    I discussed the assessment and treatment plan with the patient. The patient was provided an opportunity to ask questions and all were answered. The patient agreed with the plan and demonstrated an understanding of the instructions.   The patient was advised to call back or seek an in-person evaluation if the symptoms worsen or if the condition fails to improve as anticipated.  I provided 15 minutes of non-face-to-face time during this encounter.   Yussuf Sawyers, MD

## 2024-08-30 NOTE — Patient Outreach (Signed)
 RNCM - patient made aware NP Clark refilled Zofran  only. Gabapentin  refills available at [facility] - patient aware to call and request. Norco and methocarbamol  need to be refilled by Neuro - patient will call.

## 2024-08-30 NOTE — Telephone Encounter (Signed)
 Pt called and requested for a refill, She agreed to discuss the refill with DR. Rosslyn during her telephone visit.

## 2024-08-30 NOTE — Telephone Encounter (Signed)
 Completed and placed in Kelli's inbox.

## 2024-08-30 NOTE — Telephone Encounter (Signed)
 Patient called to obtain a refill for her pain medication and muscle relaxer. Patient advised to ask Dr. Janjua about these medications at her virtual appointment this evening. Unable to refill controlled substances and medications not prescribed by a provider at our office.   Message relayed by Martie NOVAK.

## 2024-08-30 NOTE — Telephone Encounter (Signed)
 Recevied form for dental procedure clearance. Do you need to see in office to fill out? Have placed in your box for review.

## 2024-08-31 DIAGNOSIS — Z992 Dependence on renal dialysis: Secondary | ICD-10-CM | POA: Diagnosis not present

## 2024-08-31 DIAGNOSIS — N2581 Secondary hyperparathyroidism of renal origin: Secondary | ICD-10-CM | POA: Diagnosis not present

## 2024-08-31 DIAGNOSIS — N186 End stage renal disease: Secondary | ICD-10-CM | POA: Diagnosis not present

## 2024-09-01 DIAGNOSIS — N2581 Secondary hyperparathyroidism of renal origin: Secondary | ICD-10-CM | POA: Diagnosis not present

## 2024-09-02 ENCOUNTER — Ambulatory Visit: Payer: Self-pay

## 2024-09-02 DIAGNOSIS — N186 End stage renal disease: Secondary | ICD-10-CM | POA: Diagnosis not present

## 2024-09-02 NOTE — Telephone Encounter (Signed)
 Faxed form to [facility] at [phone]; attn:  Arne.

## 2024-09-02 NOTE — Telephone Encounter (Signed)
 FYI Only or Action Required?: Action required by provider: request for appointment.  Patient was last seen in primary care on 08/06/2024 by Gretta Comer POUR, NP.  Called Nurse Triage reporting Back Pain.  Symptoms began several months ago.  Interventions attempted: Prescription medications: muscle relaxer, patient could not confirm name.  Symptoms are: gradually worsening.  Triage Disposition: See Physician Within 24 Hours (overriding See HCP Within 4 Hours (Or PCP Triage))  Patient/caregiver understands and will follow disposition?: Yes, but will wait                                 Reason for Triage: Worsening back pain, previously discussed with PCP, unable to schedule.   Reason for Disposition  [1] Pain radiates into the thigh or further down the leg AND [2] both legs  Answer Assessment - Initial Assessment Questions This RN advised in-person evaluation within 24 hours. No availability with PCP. Patient stated she only wanted to see her PCP. Please advise on scheduling. Patient would like a call back.   1. ONSET: When did the pain begin? (e.g., minutes, hours, days)     Several months ago, worsened a week ago 2. LOCATION: Where does it hurt? (upper, mid or lower back)     Right side of lower and upper back  3. SEVERITY: How bad is the pain?  (e.g., Scale 1-10; mild, moderate, or severe)     Rates pain a 5 4. PATTERN: Is the pain constant? (e.g., yes, no; constant, intermittent)      Constant 5. RADIATION: Does the pain shoot into your legs or somewhere else?     Radiates to bilateral legs 6. CAUSE:  What do you think is causing the back pain?      Unsure, states neurosurgeon she met with stated it was an issue pertaining to the muscles on the outside of her spine 7. BACK OVERUSE:  Any recent lifting of heavy objects, strenuous work or exercise?     Denies 8. MEDICINES: What have you taken so far for the pain? (e.g., nothing,  acetaminophen , NSAIDS)     Prescribed muscle relaxer  9. NEUROLOGIC SYMPTOMS: Do you have any weakness, numbness, or problems with bowel/bladder control?     Numbness in hands and feet for 1-2 weeks 10. OTHER SYMPTOMS: Do you have any other symptoms? (e.g., fever, abdomen pain, burning with urination, blood in urine)     Nausea, denies fever, denies urinary symptoms, denies abdominal pain 11. PREGNANCY: Is there any chance you are pregnant? When was your last menstrual period?     N/A  Protocols used: Back Pain-A-AH

## 2024-09-03 DIAGNOSIS — N186 End stage renal disease: Secondary | ICD-10-CM | POA: Diagnosis not present

## 2024-09-03 NOTE — Telephone Encounter (Signed)
 Attempted to contact pt. No answer, no vm. Pls schedule OV for back pain.

## 2024-09-03 NOTE — Telephone Encounter (Signed)
 Called and schedule pt for OV back pain

## 2024-09-04 DIAGNOSIS — Z992 Dependence on renal dialysis: Secondary | ICD-10-CM | POA: Diagnosis not present

## 2024-09-04 DIAGNOSIS — N2581 Secondary hyperparathyroidism of renal origin: Secondary | ICD-10-CM | POA: Diagnosis not present

## 2024-09-04 NOTE — Telephone Encounter (Signed)
 Please call patient:  Did she complete physical therapy for her back pain? If so, would she be willing to undergo a MRI lumbar spine?

## 2024-09-05 DIAGNOSIS — N186 End stage renal disease: Secondary | ICD-10-CM | POA: Diagnosis not present

## 2024-09-05 NOTE — Telephone Encounter (Signed)
 Patient sates that she has been having ongoing issues with thrush in her mouth. I have set up appointment for Monday when she could get ride. Will call if she would like to be seen sooner.

## 2024-09-05 NOTE — Telephone Encounter (Signed)
 She has pt coming to her house but for the pt you sent for back her co pay was 50 dollars and she was only able to go one time. She states that she is ok with MRI.

## 2024-09-05 NOTE — Telephone Encounter (Signed)
Spoke to pt about lab work

## 2024-09-06 DIAGNOSIS — N186 End stage renal disease: Secondary | ICD-10-CM | POA: Diagnosis not present

## 2024-09-07 ENCOUNTER — Inpatient Hospital Stay (HOSPITAL_COMMUNITY)
Admission: EM | Admit: 2024-09-07 | Discharge: 2024-09-12 | DRG: 371 | Disposition: A | Discharge status: Home / Self Care | Attending: Internal Medicine | Admitting: Internal Medicine

## 2024-09-07 ENCOUNTER — Emergency Department (HOSPITAL_COMMUNITY)

## 2024-09-07 ENCOUNTER — Other Ambulatory Visit: Payer: Self-pay

## 2024-09-07 ENCOUNTER — Encounter (HOSPITAL_COMMUNITY): Payer: Self-pay | Admitting: *Deleted

## 2024-09-07 DIAGNOSIS — E876 Hypokalemia: Secondary | ICD-10-CM | POA: Diagnosis present

## 2024-09-07 DIAGNOSIS — Z8249 Family history of ischemic heart disease and other diseases of the circulatory system: Secondary | ICD-10-CM

## 2024-09-07 DIAGNOSIS — Z9221 Personal history of antineoplastic chemotherapy: Secondary | ICD-10-CM

## 2024-09-07 DIAGNOSIS — K224 Dyskinesia of esophagus: Secondary | ICD-10-CM | POA: Diagnosis present

## 2024-09-07 DIAGNOSIS — I447 Left bundle-branch block, unspecified: Secondary | ICD-10-CM | POA: Diagnosis present

## 2024-09-07 DIAGNOSIS — K2970 Gastritis, unspecified, without bleeding: Secondary | ICD-10-CM | POA: Diagnosis present

## 2024-09-07 DIAGNOSIS — G8929 Other chronic pain: Secondary | ICD-10-CM | POA: Diagnosis present

## 2024-09-07 DIAGNOSIS — E1169 Type 2 diabetes mellitus with other specified complication: Secondary | ICD-10-CM | POA: Diagnosis present

## 2024-09-07 DIAGNOSIS — Z1152 Encounter for screening for COVID-19: Secondary | ICD-10-CM

## 2024-09-07 DIAGNOSIS — R933 Abnormal findings on diagnostic imaging of other parts of digestive tract: Secondary | ICD-10-CM

## 2024-09-07 DIAGNOSIS — K8020 Calculus of gallbladder without cholecystitis without obstruction: Secondary | ICD-10-CM | POA: Diagnosis not present

## 2024-09-07 DIAGNOSIS — Z95828 Presence of other vascular implants and grafts: Secondary | ICD-10-CM | POA: Diagnosis not present

## 2024-09-07 DIAGNOSIS — Z87891 Personal history of nicotine dependence: Secondary | ICD-10-CM

## 2024-09-07 DIAGNOSIS — N186 End stage renal disease: Secondary | ICD-10-CM | POA: Diagnosis present

## 2024-09-07 DIAGNOSIS — R590 Localized enlarged lymph nodes: Secondary | ICD-10-CM | POA: Diagnosis not present

## 2024-09-07 DIAGNOSIS — B370 Candidal stomatitis: Secondary | ICD-10-CM | POA: Diagnosis present

## 2024-09-07 DIAGNOSIS — Z888 Allergy status to other drugs, medicaments and biological substances status: Secondary | ICD-10-CM

## 2024-09-07 DIAGNOSIS — I7102 Dissection of abdominal aorta: Secondary | ICD-10-CM | POA: Diagnosis not present

## 2024-09-07 DIAGNOSIS — D352 Benign neoplasm of pituitary gland: Secondary | ICD-10-CM | POA: Diagnosis present

## 2024-09-07 DIAGNOSIS — Z85850 Personal history of malignant neoplasm of thyroid: Secondary | ICD-10-CM

## 2024-09-07 DIAGNOSIS — R12 Heartburn: Secondary | ICD-10-CM | POA: Diagnosis present

## 2024-09-07 DIAGNOSIS — R1314 Dysphagia, pharyngoesophageal phase: Secondary | ICD-10-CM | POA: Diagnosis present

## 2024-09-07 DIAGNOSIS — A0472 Enterocolitis due to Clostridium difficile, not specified as recurrent: Principal | ICD-10-CM | POA: Diagnosis present

## 2024-09-07 DIAGNOSIS — I517 Cardiomegaly: Secondary | ICD-10-CM | POA: Diagnosis not present

## 2024-09-07 DIAGNOSIS — R197 Diarrhea, unspecified: Secondary | ICD-10-CM

## 2024-09-07 DIAGNOSIS — E7849 Other hyperlipidemia: Secondary | ICD-10-CM | POA: Diagnosis present

## 2024-09-07 DIAGNOSIS — Z79899 Other long term (current) drug therapy: Secondary | ICD-10-CM

## 2024-09-07 DIAGNOSIS — Z923 Personal history of irradiation: Secondary | ICD-10-CM

## 2024-09-07 DIAGNOSIS — R630 Anorexia: Secondary | ICD-10-CM | POA: Diagnosis present

## 2024-09-07 DIAGNOSIS — Z992 Dependence on renal dialysis: Secondary | ICD-10-CM

## 2024-09-07 DIAGNOSIS — K659 Peritonitis, unspecified: Secondary | ICD-10-CM | POA: Diagnosis not present

## 2024-09-07 DIAGNOSIS — D631 Anemia in chronic kidney disease: Secondary | ICD-10-CM | POA: Diagnosis present

## 2024-09-07 DIAGNOSIS — E890 Postprocedural hypothyroidism: Secondary | ICD-10-CM | POA: Diagnosis present

## 2024-09-07 DIAGNOSIS — R1319 Other dysphagia: Secondary | ICD-10-CM | POA: Diagnosis present

## 2024-09-07 DIAGNOSIS — K2990 Gastroduodenitis, unspecified, without bleeding: Secondary | ICD-10-CM | POA: Diagnosis present

## 2024-09-07 DIAGNOSIS — Z7951 Long term (current) use of inhaled steroids: Secondary | ICD-10-CM

## 2024-09-07 DIAGNOSIS — F419 Anxiety disorder, unspecified: Secondary | ICD-10-CM | POA: Diagnosis present

## 2024-09-07 DIAGNOSIS — R0602 Shortness of breath: Secondary | ICD-10-CM | POA: Diagnosis not present

## 2024-09-07 DIAGNOSIS — Z833 Family history of diabetes mellitus: Secondary | ICD-10-CM

## 2024-09-07 DIAGNOSIS — R188 Other ascites: Secondary | ICD-10-CM | POA: Diagnosis not present

## 2024-09-07 DIAGNOSIS — Z79890 Hormone replacement therapy (postmenopausal): Secondary | ICD-10-CM

## 2024-09-07 DIAGNOSIS — F32A Depression, unspecified: Secondary | ICD-10-CM | POA: Diagnosis present

## 2024-09-07 DIAGNOSIS — E1122 Type 2 diabetes mellitus with diabetic chronic kidney disease: Secondary | ICD-10-CM | POA: Diagnosis present

## 2024-09-07 DIAGNOSIS — K219 Gastro-esophageal reflux disease without esophagitis: Secondary | ICD-10-CM | POA: Diagnosis present

## 2024-09-07 DIAGNOSIS — E1151 Type 2 diabetes mellitus with diabetic peripheral angiopathy without gangrene: Secondary | ICD-10-CM | POA: Diagnosis present

## 2024-09-07 DIAGNOSIS — Z21 Asymptomatic human immunodeficiency virus [HIV] infection status: Secondary | ICD-10-CM | POA: Diagnosis present

## 2024-09-07 DIAGNOSIS — Z885 Allergy status to narcotic agent status: Secondary | ICD-10-CM

## 2024-09-07 DIAGNOSIS — R000 Tachycardia, unspecified: Secondary | ICD-10-CM | POA: Diagnosis not present

## 2024-09-07 DIAGNOSIS — M109 Gout, unspecified: Secondary | ICD-10-CM | POA: Diagnosis present

## 2024-09-07 LAB — COMPREHENSIVE METABOLIC PANEL WITH GFR
ALT: 13 U/L (ref 0–44)
AST: 28 U/L (ref 15–41)
Albumin: 2.3 g/dL — ABNORMAL LOW (ref 3.5–5.0)
Alkaline Phosphatase: 228 U/L — ABNORMAL HIGH (ref 38–126)
Anion gap: 22 — ABNORMAL HIGH (ref 5–15)
BUN: 37 mg/dL — ABNORMAL HIGH (ref 6–20)
CO2: 22 mmol/L (ref 22–32)
Calcium: 8.8 mg/dL — ABNORMAL LOW (ref 8.9–10.3)
Chloride: 91 mmol/L — ABNORMAL LOW (ref 98–111)
Creatinine, Ser: 15.35 mg/dL — ABNORMAL HIGH (ref 0.44–1.00)
GFR, Estimated: 2 mL/min — ABNORMAL LOW (ref >60)
Glucose, Bld: 95 mg/dL (ref 70–99)
Potassium: 3.8 mmol/L (ref 3.5–5.1)
Sodium: 135 mmol/L (ref 135–145)
Total Bilirubin: 1 mg/dL (ref 0.0–1.2)
Total Protein: 7.3 g/dL (ref 6.5–8.1)

## 2024-09-07 LAB — CBC
HCT: 37.2 % (ref 36.0–46.0)
Hemoglobin: 12.1 g/dL (ref 12.0–15.0)
MCH: 36 pg — ABNORMAL HIGH (ref 26.0–34.0)
MCHC: 32.5 g/dL (ref 30.0–36.0)
MCV: 110.7 fL — ABNORMAL HIGH (ref 80.0–100.0)
Platelets: 207 K/uL (ref 150–400)
RBC: 3.36 MIL/uL — ABNORMAL LOW (ref 3.87–5.11)
RDW: 14.2 % (ref 11.5–15.5)
WBC: 10.5 K/uL (ref 4.0–10.5)
nRBC: 1.3 % — ABNORMAL HIGH (ref 0.0–0.2)

## 2024-09-07 LAB — RESP PANEL BY RT-PCR (RSV, FLU A&B, COVID)  RVPGX2
Influenza A by PCR: NEGATIVE
Influenza B by PCR: NEGATIVE
Resp Syncytial Virus by PCR: NEGATIVE
SARS Coronavirus 2 by RT PCR: NEGATIVE

## 2024-09-07 LAB — LIPASE, BLOOD: Lipase: 20 U/L (ref 11–51)

## 2024-09-07 MED ORDER — LACTATED RINGERS IV BOLUS
500.0000 mL | Freq: Once | INTRAVENOUS | Status: AC
  Administered 2024-09-07: 500 mL via INTRAVENOUS

## 2024-09-07 MED ORDER — FENTANYL CITRATE (PF) 50 MCG/ML IJ SOSY
50.0000 ug | PREFILLED_SYRINGE | Freq: Once | INTRAMUSCULAR | Status: AC
  Administered 2024-09-07: 50 ug via INTRAVENOUS
  Filled 2024-09-07: qty 1

## 2024-09-07 MED ORDER — PIPERACILLIN-TAZOBACTAM 3.375 G IVPB 30 MIN
3.3750 g | Freq: Once | INTRAVENOUS | Status: AC
  Administered 2024-09-08: 3.375 g via INTRAVENOUS
  Filled 2024-09-07: qty 50

## 2024-09-07 NOTE — ED Notes (Signed)
 The pt is a hemodialysis pt  that lives alone

## 2024-09-07 NOTE — ED Provider Notes (Signed)
 [facility] EMERGENCY DEPARTMENT AT [facility] Provider Note   CSN: [id number] Arrival date & time: 09/07/24  1843     Patient presents with: No chief complaint on file.   [name] is a 58 y.o. female.   58 year old female with past medical history of end-stage renal disease on peritoneal dialysis as well as aortic dissection status post endovascular repair presenting to the emergency department today with abdominal pain radiating to her back as well as nausea, vomiting, and diarrhea.  The patient states that she has been feeling generally weak with some nausea and vomiting now over the past few weeks.  Reports that she has been feeling generally weak today.  She is not been eating or drinking today due to this.  She has been doing her peritoneal dialysis as scheduled.  She was recently diagnosed with thrush and started on medications.  Is unclear if this may be causing some of the nausea and vomiting.  The patient also reports some shortness of breath with this.  She reports that she has been hypertensive in the past but over the last month or so she has had to come off of her blood pressure medications due to lower blood pressures.  She came to the emergency department today for further evaluation regarding this.        Prior to Admission medications   Medication Sig Start Date End Date Taking? Authorizing Provider  acetaminophen  (TYLENOL ) 500 MG tablet Take 1,000 mg by mouth every 6 (six) hours as needed for mild pain.    [provider]  albuterol  (VENTOLIN  HFA) 108 (90 Base) MCG/ACT inhaler INHALE 2 PUFFS INTO THE LUNGS UP TO EVERY 6HRS AS NEEDED FOR WHEEZING/SHORTNESS OF BREATH 09/06/23   Gretta Comer POUR, NP  allopurinol  (ZYLOPRIM ) 100 MG tablet TAKE 1 TABLET (100 MG TOTAL) BY MOUTH DAILY. FOR GOUT PREVENTION 04/11/24   Clark, Katherine K, NP  AURYXIA  1 GM 210 MG(Fe) tablet Take 210 mg by mouth 3 (three) times daily with meals. 04/05/23   [provider]  busPIRone  (BUSPAR ) 5 MG tablet TAKE 1 TABLET BY MOUTH TWICE A DAY FOR ANXIETY 08/26/24   Clark, Katherine K, NP  calcitRIOL (ROCALTROL) 0.25 MCG capsule Take 0.25 mcg by mouth 3 (three) times a week. 08/07/24   [provider]  dolutegravir  ([name] ) 50 MG tablet Take 1 tablet (50 mg total) by mouth daily. 05/13/24   Melvenia Corean SAILOR, NP  emtricitabine -tenofovir  AF (DESCOVY ) 200-25 MG tablet Take 1 tablet by mouth daily. 05/13/24   Melvenia Corean SAILOR, NP  fluticasone  (FLOVENT  HFA) 110 MCG/ACT inhaler Inhale 1 puff into the lungs 2 (two) times daily.    [provider]  gabapentin  (NEURONTIN ) 100 MG capsule Take 1 capsule (100 mg total) by mouth 3 (three) times daily. Patient not taking: Reported on 08/28/2024 08/15/24   Lanis Fonda BRAVO, MD  HYDROcodone -acetaminophen  (NORCO/VICODIN) 5-325 MG tablet Take 1 tablet by mouth every 6 (six) hours as needed. Patient not taking: Reported on 08/28/2024 08/04/24   Arlice Reichert, MD  Lancets [facility] ULTRASOFT) lancets Use as instructed to test blood sugar daily 02/15/24   Clark, Katherine K, NP  levocetirizine (XYZAL ) 5 MG tablet TAKE 1 TABLET BY MOUTH EVERY DAY IN THE EVENING FOR ALLERGIES 05/12/24   Clark, Katherine K, NP  lidocaine  (LIDODERM ) 5 % PLACE 1 PATCH ONTO THE SKIN DAILY. REMOVE & DISCARD PATCH WITHIN 12 HOURS OR AS DIRECTED BY MD Patient not taking: Reported on 08/28/2024 04/21/24  Gretta Comer POUR, NP  losartan  (COZAAR ) 100 MG tablet Take 100 mg by mouth daily. Patient not taking: Reported on 08/28/2024 08/06/24   [provider]  losartan  (COZAAR ) 50 MG tablet Take 50 mg by mouth daily. Patient not taking: Reported on 08/28/2024 08/10/24   [provider]  methocarbamol  (ROBAXIN ) 500 MG tablet Take 1 tablet (500 mg total) by mouth every 6 (six) hours as needed for muscle spasms. 08/30/24   Janjua, Rashid M, MD  methylPREDNISolone  (MEDROL  DOSEPAK) 4 MG TBPK tablet follow package  directions Patient not taking: Reported on 08/28/2024 08/04/24   Arlice Reichert, MD  minocycline  (MINOCIN ) 100 MG capsule Take 1 capsule (100 mg total) by mouth 2 (two) times daily. 03/13/24   Melvenia Corean SAILOR, NP  omeprazole  (PRILOSEC) 40 MG capsule Take 1 capsule (40 mg total) by mouth daily. for heartburn. 04/04/24   Clark, Katherine K, NP  ondansetron  (ZOFRAN -ODT) 4 MG disintegrating tablet Take 1 tablet (4 mg total) by mouth every 8 (eight) hours as needed for nausea or vomiting. 08/28/24   Clark, Katherine K, NP  rosuvastatin  (CRESTOR ) 10 MG tablet TAKE 1 TABLET BY MOUTH EVERY DAY FOR CHOLESTEROL 03/21/24   Clark, Katherine K, NP  saxagliptin  HCl (ONGLYZA) 2.5 MG TABS tablet TAKE 1 TABLET (2.5 MG TOTAL) BY MOUTH DAILY. FOR DIABETES. 07/20/24   Clark, Katherine K, NP  sevelamer  carbonate (RENVELA ) 800 MG tablet Take by mouth. 08/07/24   [provider]  SYNTHROID  150 MCG tablet TAKE 1 TABLET BY MOUTH DAILY BEFORE BREAKFAST. 03/12/24   Trixie File, MD    Allergies: Genvoya [elviteg-cobic-emtricit-tenofaf], Lisinopril, Aldactone  [spironolactone ], Valium  [diazepam ], and Tegaderm ag mesh [silver]    Review of Systems  Updated Vital Signs BP 103/62   Pulse 90   Temp 98.1 F (36.7 C) (Oral)   Resp 14   Ht 5' 5 (1.651 m)   Wt 80.7 kg   LMP 03/31/2014 (LMP Unknown)   SpO2 100%   BMI 29.61 kg/m   Physical Exam  (all labs ordered are listed, but only abnormal results are displayed) Labs Reviewed  COMPREHENSIVE METABOLIC PANEL WITH GFR - Abnormal; Notable for the following components:      Result Value   Chloride 91 (*)    BUN 37 (*)    Creatinine, Ser 15.35 (*)    Calcium  8.8 (*)    Albumin  2.3 (*)    Alkaline Phosphatase 228 (*)    GFR, Estimated 2 (*)    Anion gap 22 (*)    All other components within normal limits  CBC - Abnormal; Notable for the following components:   RBC 3.36 (*)    MCV 110.7 (*)    MCH 36.0 (*)    nRBC 1.3 (*)    All other components  within normal limits  RESP PANEL BY RT-PCR (RSV, FLU A&B, COVID)  RVPGX2  LIPASE, BLOOD  URINALYSIS, ROUTINE W REFLEX MICROSCOPIC    EKG: EKG Interpretation Date/Time:  Saturday September 07 2024 19:26:39 EST Ventricular Rate:  103 PR Interval:  158 QRS Duration:  160 QT Interval:  426 QTC Calculation: 558 R Axis:   -68  Text Interpretation: Sinus tachycardia Possible Left atrial enlargement Left axis deviation Left bundle branch block Abnormal ECG When compared with ECG of 30-Jul-2024 19:35, PREVIOUS ECG IS PRESENT Confirmed by Ula Barter [phone]) on 09/07/2024 7:56:34 PM  Radiology: CT ABDOMEN WO CONTRAST Result Date: 09/07/2024 EXAM: CT ABDOMEN WITHOUT CONTRAST 09/07/2024 10:05:51 PM TECHNIQUE: CT of the abdomen was  performed without the administration of intravenous contrast. Multiplanar reformatted images are provided for review. Automated exposure control, iterative reconstruction, and/or weight based adjustment of the mA/kV was utilized to reduce the radiation dose to as low as reasonably achievable. COMPARISON: 07/18/2024 CLINICAL HISTORY: Epigastric pain; Diffuse abd pain, N/V/D. FINDINGS: LOWER CHEST: Visualized portion of the lower chest demonstrates no acute abnormality. HEPATOBILIARY: The visualized liver is unremarkable. 1 cm gallstone layering within the gallbladder. SPLEEN: Spleen demonstrates no acute abnormality. PANCREAS: Pancreas demonstrates no acute abnormality. ADRENAL GLANDS: Diffuse enlargement of the left adrenal gland compatible with hyperplasia, stable. KIDNEYS: No stones in the kidneys or proximal ureters. No hydronephrosis. No perinephric or periureteral stranding. GI AND BOWEL: Stomach and duodenal sweep demonstrate no acute abnormality. There is no bowel obstruction. No abnormal bowel wall thickening or distension. PERITONEUM AND RETROPERITONEUM: Small amount of ascites adjacent to the liver and spleen. Stent graft repair of aortic dissection. Stent graft extends  into the upper abdominal aorta. Dissection continues throughout the abdominal aorta, unchanged since prior study . No free air. LYMPH NODES: No lymphadenopathy. BONES AND SOFT TISSUES: No acute abnormality of the visualized bones. No focal soft tissue abnormality. IMPRESSION: 1. Stent graft repair of aortic dissection with continued dissection throughout the abdominal aorta, stable since prior study . 2. Small amount of ascites adjacent to the liver and spleen. Electronically signed by: Franky Crease MD 09/07/2024 10:19 PM EST RP Workstation: [id number]   DG Chest Portable 1 View Result Date: 09/07/2024 EXAM: 1 VIEW(S) XRAY OF THE CHEST 09/07/2024 08:40:00 PM COMPARISON: None available. CLINICAL HISTORY: SOB FINDINGS: LUNGS AND PLEURA: No vascular congestion, edema, or consolidation. No pleural effusion or pneumothorax. HEART AND MEDIASTINUM: Postoperative changes in the mediastinum. Thoracic aortic stent in place. Mild cardiac enlargement. BONES AND SOFT TISSUES: Surgical clips in the base of the neck. No acute osseous abnormality. IMPRESSION: 1. No acute cardiopulmonary process. 2. Mild cardiomegaly. Electronically signed by: Elsie Gravely MD 09/07/2024 08:47 PM EST RP Workstation: [id number]     Procedures   Medications Ordered in the ED  piperacillin -tazobactam (ZOSYN ) IVPB 3.375 g (has no administration in time range)  lactated ringers  bolus 500 mL (500 mLs Intravenous New Bag/Given 09/07/24 2221)  fentaNYL  (SUBLIMAZE ) injection 50 mcg (50 mcg Intravenous Given 09/07/24 2216)                                    Medical Decision Making 58 year old female with past medical history of end-stage renal disease on peritoneal dialysis presenting to the emergency department today with abdominal pain.  I will further evaluate the patient here with CT scan as well as basic labs here.  This seems to be more of a worsening issue over the past few days and the patient does have a tender abdomen here on  exam.  With her low blood pressures here and reassuring neurovascular exam suspicion for aortic pathology is low at this time.  The back pain that she is describing has been a chronic issue since her aortic dissection.  Her primary complaint today is the abdominal pain.  Will obtain a CT scan for further evaluation.  I will give the patient Bentyl for pain.  There is concern for possible peritonitis given the patient's peritoneal dialysis status.  The patient's work appears largely unremarkable with exception of her known renal dysfunction consistent with her end-stage renal disease.  Potassium is within normal limits.  The patient did  receive minimal fluids with some improvement.  She still having pain.  I called and discussed her case with Dr. Vassie.  Recommends IV antibiotics and admission and they will further evaluate the patient tomorrow for peritonitis with fluid analysis.  She is covered with Zosyn  and will be admitted.  Amount and/or Complexity of Data Reviewed Labs: ordered. Radiology: ordered.  Risk Prescription drug management. Decision regarding hospitalization.        Final diagnoses:  Peritonitis [facility])    ED Discharge Orders     None          Ula Prentice SAUNDERS, MD 09/07/24 2325

## 2024-09-07 NOTE — Progress Notes (Signed)
 ED Pharmacy Antibiotic Sign Off An antibiotic consult was received from an ED provider for zosyn  per pharmacy dosing for IAI/peritonitis. A chart review was completed to assess appropriateness.   The following one time order(s) were placed:   -Zosyn  3.375g IV x1  Further antibiotic and/or antibiotic pharmacy consults should be ordered by the admitting provider if indicated.   Thank you for allowing pharmacy to be a part of this patient's care.   Lynwood Poplar, [facility]  Clinical Pharmacist 09/07/24 11:23 PM

## 2024-09-07 NOTE — ED Triage Notes (Signed)
 Patient has been shob, nauseous, vomiting, diarrhea for the past 5 days. She reports she has not eaten in 2-3 days due to having thrush. She states she feels weak.   Denies hx of COPD, CHF, and asthma.

## 2024-09-07 NOTE — Telephone Encounter (Signed)
 Noted. Where is her back pain located? Lower back? Both sides? Does it move down her legs? Which side?  Corean, do you have any thoughts regarding her ongoing thrush? Side effects from regimen? Has she discussed this with you?

## 2024-09-08 DIAGNOSIS — Z87891 Personal history of nicotine dependence: Secondary | ICD-10-CM | POA: Diagnosis not present

## 2024-09-08 DIAGNOSIS — F418 Other specified anxiety disorders: Secondary | ICD-10-CM | POA: Diagnosis not present

## 2024-09-08 DIAGNOSIS — R1310 Dysphagia, unspecified: Secondary | ICD-10-CM | POA: Diagnosis not present

## 2024-09-08 DIAGNOSIS — K224 Dyskinesia of esophagus: Secondary | ICD-10-CM | POA: Diagnosis not present

## 2024-09-08 DIAGNOSIS — E1169 Type 2 diabetes mellitus with other specified complication: Secondary | ICD-10-CM | POA: Diagnosis not present

## 2024-09-08 DIAGNOSIS — K2950 Unspecified chronic gastritis without bleeding: Secondary | ICD-10-CM | POA: Diagnosis not present

## 2024-09-08 DIAGNOSIS — E785 Hyperlipidemia, unspecified: Secondary | ICD-10-CM | POA: Diagnosis not present

## 2024-09-08 DIAGNOSIS — K659 Peritonitis, unspecified: Secondary | ICD-10-CM | POA: Diagnosis not present

## 2024-09-08 DIAGNOSIS — I10 Essential (primary) hypertension: Secondary | ICD-10-CM | POA: Diagnosis not present

## 2024-09-08 DIAGNOSIS — R1013 Epigastric pain: Secondary | ICD-10-CM | POA: Diagnosis not present

## 2024-09-08 DIAGNOSIS — R197 Diarrhea, unspecified: Secondary | ICD-10-CM

## 2024-09-08 DIAGNOSIS — N186 End stage renal disease: Secondary | ICD-10-CM | POA: Diagnosis not present

## 2024-09-08 DIAGNOSIS — R933 Abnormal findings on diagnostic imaging of other parts of digestive tract: Secondary | ICD-10-CM | POA: Diagnosis not present

## 2024-09-08 DIAGNOSIS — Z21 Asymptomatic human immunodeficiency virus [HIV] infection status: Secondary | ICD-10-CM | POA: Diagnosis not present

## 2024-09-08 DIAGNOSIS — R634 Abnormal weight loss: Secondary | ICD-10-CM | POA: Diagnosis not present

## 2024-09-08 DIAGNOSIS — R1319 Other dysphagia: Secondary | ICD-10-CM | POA: Diagnosis not present

## 2024-09-08 HISTORY — DX: Peritonitis, unspecified: K65.9

## 2024-09-08 LAB — CLOSTRIDIUM DIFFICILE BY PCR, REFLEXED
Hypervirulent Strain: NEGATIVE
Toxigenic C. Difficile by PCR: NEGATIVE

## 2024-09-08 LAB — BODY FLUID CELL COUNT WITH DIFFERENTIAL
Eos, Fluid: 1 %
Lymphs, Fluid: 9 %
Monocyte-Macrophage-Serous Fluid: 69 % (ref 50–90)
Neutrophil Count, Fluid: 21 % (ref 0–25)
Total Nucleated Cell Count, Fluid: 61 uL (ref 0–1000)

## 2024-09-08 LAB — BASIC METABOLIC PANEL WITH GFR
Anion gap: 18 — ABNORMAL HIGH (ref 5–15)
BUN: 40 mg/dL — ABNORMAL HIGH (ref 6–20)
CO2: 24 mmol/L (ref 22–32)
Calcium: 8.3 mg/dL — ABNORMAL LOW (ref 8.9–10.3)
Chloride: 95 mmol/L — ABNORMAL LOW (ref 98–111)
Creatinine, Ser: 15.88 mg/dL — ABNORMAL HIGH (ref 0.44–1.00)
GFR, Estimated: 2 mL/min — ABNORMAL LOW (ref >60)
Glucose, Bld: 68 mg/dL — ABNORMAL LOW (ref 70–99)
Potassium: 3.7 mmol/L (ref 3.5–5.1)
Sodium: 137 mmol/L (ref 135–145)

## 2024-09-08 LAB — C DIFFICILE QUICK SCREEN W PCR REFLEX
C Diff antigen: POSITIVE — AB
C Diff toxin: NEGATIVE

## 2024-09-08 LAB — GLUCOSE, CAPILLARY
Glucose-Capillary: 78 mg/dL (ref 70–99)
Glucose-Capillary: 124 mg/dL — ABNORMAL HIGH (ref 70–99)
Glucose-Capillary: 103 mg/dL — ABNORMAL HIGH (ref 70–99)

## 2024-09-08 LAB — CBC
HCT: 31 % — ABNORMAL LOW (ref 36.0–46.0)
Hemoglobin: 10.5 g/dL — ABNORMAL LOW (ref 12.0–15.0)
MCH: 36.7 pg — ABNORMAL HIGH (ref 26.0–34.0)
MCHC: 33.9 g/dL (ref 30.0–36.0)
MCV: 108.4 fL — ABNORMAL HIGH (ref 80.0–100.0)
Platelets: 177 K/uL (ref 150–400)
RBC: 2.86 MIL/uL — ABNORMAL LOW (ref 3.87–5.11)
RDW: 14.1 % (ref 11.5–15.5)
WBC: 9.8 K/uL (ref 4.0–10.5)
nRBC: 0.6 % — ABNORMAL HIGH (ref 0.0–0.2)

## 2024-09-08 LAB — CBG MONITORING, ED
Glucose-Capillary: 73 mg/dL (ref 70–99)
Glucose-Capillary: 68 mg/dL — ABNORMAL LOW (ref 70–99)
Glucose-Capillary: 93 mg/dL (ref 70–99)

## 2024-09-08 LAB — CORTISOL: Cortisol, Plasma: 10.7 ug/dL

## 2024-09-08 LAB — TSH: TSH: 0.354 u[IU]/mL (ref 0.350–4.500)

## 2024-09-08 MED ORDER — HEPARIN SODIUM (PORCINE) 1000 UNIT/ML IJ SOLN
INTRAPERITONEAL | Status: DC | PRN
  Filled 2024-09-08 (×2): qty 6000

## 2024-09-08 MED ORDER — PANTOPRAZOLE SODIUM 40 MG PO TBEC
40.0000 mg | DELAYED_RELEASE_TABLET | Freq: Every day | ORAL | Status: DC
  Administered 2024-09-08: 40 mg via ORAL
  Filled 2024-09-08: qty 1

## 2024-09-08 MED ORDER — GENTAMICIN SULFATE 0.1 % EX CREA: 1.0000 | TOPICAL_CREAM | Freq: Every day | CUTANEOUS | Status: DC

## 2024-09-08 MED ORDER — LEVOTHYROXINE SODIUM 75 MCG PO TABS
150.0000 ug | ORAL_TABLET | Freq: Every day | ORAL | Status: DC
  Administered 2024-09-08 – 2024-09-12 (×5): 150 ug via ORAL
  Filled 2024-09-08 (×5): qty 2

## 2024-09-08 MED ORDER — PIPERACILLIN-TAZOBACTAM IN DEX 2-0.25 GM/50ML IV SOLN
2.2500 g | Freq: Three times a day (TID) | INTRAVENOUS | Status: DC
  Administered 2024-09-08 (×2): 2.25 g via INTRAVENOUS
  Filled 2024-09-08 (×4): qty 50

## 2024-09-08 MED ORDER — LORATADINE 10 MG PO TABS
10.0000 mg | ORAL_TABLET | Freq: Every evening | ORAL | Status: DC
  Administered 2024-09-08 – 2024-09-11 (×4): 10 mg via ORAL
  Filled 2024-09-08 (×4): qty 1

## 2024-09-08 MED ORDER — BUSPIRONE HCL 5 MG PO TABS
5.0000 mg | ORAL_TABLET | Freq: Two times a day (BID) | ORAL | Status: DC
  Administered 2024-09-08 – 2024-09-12 (×9): 5 mg via ORAL
  Filled 2024-09-08 (×9): qty 1

## 2024-09-08 MED ORDER — FLUTICASONE PROPIONATE HFA 110 MCG/ACT IN AERO: 1.0000 | INHALATION_SPRAY | Freq: Two times a day (BID) | RESPIRATORY_TRACT | Status: DC

## 2024-09-08 MED ORDER — HEPARIN SODIUM (PORCINE) 1000 UNIT/ML IJ SOLN: INTRAPERITONEAL | Status: DC | PRN

## 2024-09-08 MED ORDER — BUDESONIDE 0.25 MG/2ML IN SUSP
0.2500 mg | Freq: Two times a day (BID) | RESPIRATORY_TRACT | Status: DC
  Administered 2024-09-08 – 2024-09-12 (×7): 0.25 mg via RESPIRATORY_TRACT
  Filled 2024-09-08 (×8): qty 2

## 2024-09-08 MED ORDER — HYDROCODONE-ACETAMINOPHEN 5-325 MG PO TABS
1.0000 | ORAL_TABLET | Freq: Four times a day (QID) | ORAL | Status: DC | PRN
Start: 2024-09-08 — End: 2024-09-12
  Administered 2024-09-08 – 2024-09-12 (×6): 1 via ORAL
  Filled 2024-09-08 (×7): qty 1

## 2024-09-08 MED ORDER — VANCOMYCIN VARIABLE DOSE PER UNSTABLE RENAL FUNCTION (PHARMACIST DOSING): Status: DC

## 2024-09-08 MED ORDER — EMTRICITABINE-TENOFOVIR AF 200-25 MG PO TABS
1.0000 | ORAL_TABLET | Freq: Every day | ORAL | Status: DC
  Administered 2024-09-08 – 2024-09-12 (×5): 1 via ORAL
  Filled 2024-09-08 (×5): qty 1

## 2024-09-08 MED ORDER — SEVELAMER CARBONATE 800 MG PO TABS
800.0000 mg | ORAL_TABLET | Freq: Three times a day (TID) | ORAL | Status: DC
  Administered 2024-09-08 – 2024-09-12 (×7): 800 mg via ORAL
  Filled 2024-09-08 (×9): qty 1

## 2024-09-08 MED ORDER — DELFLEX-LC/1.5% DEXTROSE 344 MOSM/L IP SOLN: INTRAPERITONEAL | Status: DC

## 2024-09-08 MED ORDER — VANCOMYCIN HCL 2000 MG/400ML IV SOLN
2000.0000 mg | Freq: Once | INTRAVENOUS | Status: AC
  Administered 2024-09-08: 2000 mg via INTRAVENOUS
  Filled 2024-09-08: qty 400

## 2024-09-08 MED ORDER — ROSUVASTATIN CALCIUM 5 MG PO TABS
10.0000 mg | ORAL_TABLET | Freq: Every day | ORAL | Status: DC
  Administered 2024-09-08 – 2024-09-12 (×5): 10 mg via ORAL
  Filled 2024-09-08 (×5): qty 2

## 2024-09-08 MED ORDER — LIDOCAINE 5 % EX PTCH
1.0000 | MEDICATED_PATCH | CUTANEOUS | Status: DC
  Administered 2024-09-08: 1 via TRANSDERMAL
  Filled 2024-09-08 (×3): qty 1

## 2024-09-08 MED ORDER — VANCOMYCIN HCL 125 MG PO CAPS
125.0000 mg | ORAL_CAPSULE | Freq: Four times a day (QID) | ORAL | Status: DC
  Administered 2024-09-08 – 2024-09-12 (×16): 125 mg via ORAL
  Filled 2024-09-08 (×18): qty 1

## 2024-09-08 MED ORDER — NALOXONE HCL 0.4 MG/ML IJ SOLN
0.4000 mg | INTRAMUSCULAR | Status: DC | PRN
Start: 2024-09-08 — End: 2024-09-12

## 2024-09-08 MED ORDER — NYSTATIN 100000 UNIT/ML MT SUSP
5.0000 mL | Freq: Four times a day (QID) | OROMUCOSAL | Status: DC
  Administered 2024-09-08 – 2024-09-12 (×12): 500000 [IU] via ORAL
  Filled 2024-09-08 (×12): qty 5

## 2024-09-08 MED ORDER — DOLUTEGRAVIR SODIUM 50 MG PO TABS
50.0000 mg | ORAL_TABLET | Freq: Every day | ORAL | Status: DC
  Administered 2024-09-08 – 2024-09-12 (×5): 50 mg via ORAL
  Filled 2024-09-08 (×5): qty 1

## 2024-09-08 MED ORDER — GENTAMICIN SULFATE 0.1 % EX OINT
TOPICAL_OINTMENT | CUTANEOUS | Status: AC
  Filled 2024-09-08: qty 15

## 2024-09-08 MED ORDER — ALLOPURINOL 100 MG PO TABS
100.0000 mg | ORAL_TABLET | Freq: Every day | ORAL | Status: DC
  Administered 2024-09-08 – 2024-09-12 (×5): 100 mg via ORAL
  Filled 2024-09-08 (×5): qty 1

## 2024-09-08 MED ORDER — METHOCARBAMOL 500 MG PO TABS
500.0000 mg | ORAL_TABLET | Freq: Four times a day (QID) | ORAL | Status: DC | PRN
  Administered 2024-09-10 – 2024-09-11 (×2): 500 mg via ORAL
  Filled 2024-09-08 (×2): qty 1

## 2024-09-08 MED ORDER — DELFLEX-LC/1.5% DEXTROSE 344 MOSM/L IP SOLN
Freq: Once | INTRAPERITONEAL | Status: DC
  Filled 2024-09-08 (×2): qty 3000

## 2024-09-08 MED ORDER — ALBUTEROL SULFATE (2.5 MG/3ML) 0.083% IN NEBU
3.0000 mL | INHALATION_SOLUTION | Freq: Four times a day (QID) | RESPIRATORY_TRACT | Status: DC | PRN
Start: 2024-09-08 — End: 2024-09-12

## 2024-09-08 MED ORDER — GENTAMICIN SULFATE 0.1 % EX CREA
1.0000 | TOPICAL_CREAM | Freq: Every day | CUTANEOUS | Status: DC
  Administered 2024-09-09 – 2024-09-12 (×4): 1 via TOPICAL
  Filled 2024-09-08: qty 15

## 2024-09-08 MED ORDER — INSULIN ASPART 100 UNIT/ML IJ SOLN: 0.0000 [IU] | INTRAMUSCULAR | Status: DC

## 2024-09-08 NOTE — ED Notes (Addendum)
 Rechecked pt. CBG; CBG was 93; EDP notified

## 2024-09-08 NOTE — Progress Notes (Signed)
 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 intake, watery diarrhea for the past 7 days.  She was also found to have significant abdominal tenderness on palpation when she arrived, however she denies any abdominal discomfort at home.  She was also hypotensive.  Due to concern for peritonitis with abdominal pain, she was placed on IV antibiotics and admitted to the hospital.  Nephrology consulted  Subjective / 24h Interval events: She denies any chest discomfort, no nausea or vomiting on my evaluation.  She denies any fevers or chills.  She tells me that her PD is working as appropriate and last time she underwent dialysis was Friday overnight  Assesement and Plan: Principal problem ESRD on PD, concern for possible peritonitis -has been placed on antibiotics, continue.  Nephrology consulted, appreciate input, will send fluid status, will follow-up on results - Trend vitals, white count - To continue dialysis per nephrology, PD versus IHD  Active problems Diarrhea-she recently completed a course of antibiotics for UTI, finished about 3 weeks ago.  Rule out C. difficile, obtain GI pathogen panel as well.  Given pituitary issues and suppressed TSH a few weeks ago, will repeat to ensure she is not in a hyperthyroid state  Pituitary macroadenoma, apoplexy-given poor p.o. intake, nausea, vomiting, obtain cortisol levels.  History of HIV-continue home medications  History of papillary thyroid  carcinoma-s/p surgery, now  hypothyroid on Synthroid .  Obtain TSH as above  Anxiety, depression-continue home medications  Chronic low back pain-continue medications  History of gout-continue allopurinol   DM 2, with hypoglycemia-CBG this morning 68, likely in the setting of poor p.o. intake.  Continue to monitor.  She is on sliding scale if needed  Scheduled Meds:  allopurinol   100 mg Oral Daily   busPIRone   5 mg Oral BID   dolutegravir   50 mg Oral Daily   emtricitabine -tenofovir  AF  1 tablet Oral Daily   fluticasone   1 puff Inhalation BID   gentamicin  cream  1 Application Topical Daily   gentamicin  cream  1 Application Topical Daily   insulin  aspart  0-6 Units Subcutaneous Q4H   levothyroxine   150 mcg Oral QAC breakfast   lidocaine   1 patch Transdermal Q24H   loratadine   10 mg Oral QPM   nystatin   5 mL Oral QID   pantoprazole   40 mg Oral Daily   rosuvastatin   10 mg Oral Daily   sevelamer  carbonate  800 mg Oral TID WC   vancomycin  variable dose per unstable renal function (pharmacist dosing)   Does not apply See admin instructions   Continuous Infusions:  dialysis solution 1.5% low-MG/low-CA     dialysis solution 1.5% low-MG/low-CA     piperacillin -tazobactam (ZOSYN )  IV Stopped (09/08/24 0628)   PRN Meds:.albuterol , HYDROcodone -acetaminophen , methocarbamol , naLOXone  (NARCAN )  injection  Current Outpatient Medications  Medication Instructions   acetaminophen  (TYLENOL ) 1,000 mg, Every 6 hours PRN   albuterol  (VENTOLIN  HFA) 108 (90 Base) MCG/ACT inhaler INHALE 2 PUFFS INTO THE LUNGS UP TO EVERY 6HRS AS NEEDED FOR WHEEZING/SHORTNESS OF BREATH   allopurinol  (ZYLOPRIM ) 100 mg, Oral, Daily, For gout prevention  Auryxia  210 mg, 3 times daily with meals   busPIRone  (BUSPAR ) 5 MG tablet TAKE 1 TABLET BY MOUTH TWICE A DAY FOR ANXIETY   calcitRIOL (ROCALTROL) 0.25 mcg, 3 times weekly   dolutegravir  ([name] ) 50 mg, Oral, Daily   emtricitabine -tenofovir  AF (DESCOVY ) 200-25 MG tablet 1 tablet, Oral, Daily    fluticasone  (FLOVENT  HFA) 110 MCG/ACT inhaler 1 puff, 2 times daily   gabapentin  (NEURONTIN ) 100 mg, Oral, 3 times daily   HYDROcodone -acetaminophen  (NORCO/VICODIN) 5-325 MG tablet 1 tablet, Oral, Every 6 hours PRN   Lancets (ONETOUCH ULTRASOFT) lancets Use as instructed to test blood sugar daily   levocetirizine (XYZAL ) 5 MG tablet TAKE 1 TABLET BY MOUTH EVERY DAY IN THE EVENING FOR ALLERGIES   lidocaine  (LIDODERM ) 5 % 1 patch, Transdermal, Every 24 hours, Remove & Discard patch within 12 hours or as directed by MD   losartan  (COZAAR ) 50 mg, Daily   losartan  (COZAAR ) 100 mg, Daily   methocarbamol  (ROBAXIN ) 500 mg, Oral, Every 6 hours PRN   minocycline  (MINOCIN ) 100 mg, Oral, 2 times daily   nystatin  (MYCOSTATIN ) 100000 UNIT/ML suspension 5 mLs, 4 times daily   omeprazole  (PRILOSEC) 40 mg, Oral, Daily, for heartburn.   ondansetron  (ZOFRAN -ODT) 4 mg, Oral, Every 8 hours PRN   rosuvastatin  (CRESTOR ) 10 MG tablet TAKE 1 TABLET BY MOUTH EVERY DAY FOR CHOLESTEROL   saxagliptin  HCl (ONGLYZA) 2.5 mg, Oral, Daily, for diabetes.   sevelamer  carbonate (RENVELA ) 800 mg, 3 times daily with meals   Synthroid  150 mcg, Oral, Daily before breakfast    Diet Orders (From admission, onward)     Start     Ordered   09/08/24 0547  Diet renal with fluid restriction Fluid restriction: 1200 mL Fluid; Room service appropriate? Yes; Fluid consistency: Thin  Diet effective now       Question Answer Comment  Fluid restriction: 1200 mL Fluid   Room service appropriate? Yes   Fluid consistency: Thin      09/08/24 0546            DVT prophylaxis: SCDs Start: 09/08/24 0429   Lab Results  Component Value Date   PLT 177 09/08/2024      Code Status: Full Code  Family Communication: No family at bedside  Status is: Observation The patient will require care spanning > 2 midnights and should be moved to inpatient because: IV antibiotics  Level of care: Progressive  Consultants:   Nephrology  Objective: Vitals:   09/08/24 0721 09/08/24 0734 09/08/24 0900 09/08/24 0913  BP: (!) 100/53  (!) 115/56   Pulse: 85     Resp: 20  10   Temp:  98.7 F (37.1 C)    TempSrc:  Oral    SpO2: 98%   100%  Weight:      Height:        Intake/Output Summary (Last 24 hours) at 09/08/2024 1014 Last data filed at 09/08/2024 9662 Gross per 24 hour  Intake 550 ml  Output --  Net 550 ml   Wt Readings from Last 3 Encounters:  09/07/24 80.7 kg  08/28/24 80.7 kg  08/16/24 82.2 kg    Examination:  Constitutional: NAD Eyes: no scleral icterus ENMT: Mucous membranes are moist.  Neck: normal, supple Respiratory: clear to auscultation bilaterally, no wheezing, no crackles.  Cardiovascular: Regular rate and rhythm, no murmurs / rubs / gallops. No LE edema.  Abdomen: non distended, mild tenderness throughout, no guarding Musculoskeletal: no clubbing / cyanosis.  Skin: no rashes  Neurologic: non focal  Data Reviewed: I have independently reviewed following labs and imaging studies   CBC Recent Labs  Lab 09/07/24 1922 09/08/24 0527  WBC 10.5 9.8  HGB 12.1 10.5*  HCT 37.2 31.0*  PLT 207 177  MCV 110.7* 108.4*  MCH 36.0* 36.7*  MCHC 32.5 33.9  RDW 14.2 14.1    Recent Labs  Lab 09/07/24 1922 09/08/24 0527  NA 135 137  K 3.8 3.7  CL 91* 95*  CO2 22 24  GLUCOSE 95 68*  BUN 37* 40*  CREATININE 15.35* 15.88*  CALCIUM  8.8* 8.3*  AST 28  --   ALT 13  --   ALKPHOS 228*  --   BILITOT 1.0  --   ALBUMIN  2.3*  --     ------------------------------------------------------------------------------------------------------------------ No results for input(s): CHOL, HDL, LDLCALC, TRIG, CHOLHDL, LDLDIRECT in the last 72 hours.  Lab Results  Component Value Date   HGBA1C 6.1 (H) 07/31/2024   ------------------------------------------------------------------------------------------------------------------ No results for input(s): TSH, T4TOTAL,  T3FREE, THYROIDAB in the last 72 hours.  Invalid input(s): FREET3  Cardiac Enzymes No results for input(s): CKMB, TROPONINI, MYOGLOBIN in the last 168 hours.  Invalid input(s): CK ------------------------------------------------------------------------------------------------------------------    Component Value Date/Time   BNP 22.5 07/11/2019 1400    CBG: Recent Labs  Lab 09/08/24 0524 09/08/24 0736 09/08/24 0817  GLUCAP 73 68* 93    Recent Results (from the past 240 hours)  Resp panel by RT-PCR (RSV, Flu A&B, Covid) Anterior Nasal Swab     Status: None   Collection Time: 09/07/24  7:57 PM   Specimen: Anterior Nasal Swab  Result Value Ref Range Status   SARS Coronavirus 2 by RT PCR NEGATIVE NEGATIVE Final   Influenza A by PCR NEGATIVE NEGATIVE Final   Influenza B by PCR NEGATIVE NEGATIVE Final    Comment: (NOTE) The Xpert Xpress SARS-CoV-2/FLU/RSV plus assay is intended as an aid in the diagnosis of influenza from Nasopharyngeal swab specimens and should not be used as a sole basis for treatment. Nasal washings and aspirates are unacceptable for Xpert Xpress SARS-CoV-2/FLU/RSV testing.  Fact Sheet for Patients: bloggercourse.com  Fact Sheet for Healthcare Providers: seriousbroker.it  This test is not yet approved or cleared by the [place]  FDA and has been authorized for detection and/or diagnosis of SARS-CoV-2 by FDA under an Emergency Use Authorization (EUA). This EUA will remain in effect (meaning this test can be used) for the duration of the COVID-19 declaration under Section 564(b)(1) of the Act, 21 U.S.C. section 360bbb-3(b)(1), unless the authorization is terminated or revoked.     Resp Syncytial Virus by PCR NEGATIVE NEGATIVE Final    Comment: (NOTE) Fact Sheet for Patients: bloggercourse.com  Fact Sheet for Healthcare  Providers: seriousbroker.it  This test is not yet approved or cleared by the [place]  FDA and has been authorized for detection and/or diagnosis of SARS-CoV-2 by FDA under an Emergency Use Authorization (EUA). This EUA will remain in effect (meaning this test can be used) for the duration of the COVID-19 declaration under Section 564(b)(1) of the Act, 21 U.S.C. section 360bbb-3(b)(1), unless the authorization is terminated or revoked.  Performed at [facility], [address] [place], [place] [address]      Radiology Studies: CT ABDOMEN WO CONTRAST Result Date: 09/07/2024 EXAM: CT ABDOMEN WITHOUT CONTRAST 09/07/2024 10:05:51 PM TECHNIQUE: CT of the abdomen was performed without the administration of intravenous contrast. Multiplanar reformatted images are provided for review. Automated exposure control, iterative reconstruction, and/or  weight based adjustment of the mA/kV was utilized to reduce the radiation dose to as low as reasonably achievable. COMPARISON: 07/18/2024 CLINICAL HISTORY: Epigastric pain; Diffuse abd pain, N/V/D. FINDINGS: LOWER CHEST: Visualized portion of the lower chest demonstrates no acute abnormality. HEPATOBILIARY: The visualized liver is unremarkable. 1 cm gallstone layering within the gallbladder. SPLEEN: Spleen demonstrates no acute abnormality. PANCREAS: Pancreas demonstrates no acute abnormality. ADRENAL GLANDS: Diffuse enlargement of the left adrenal gland compatible with hyperplasia, stable. KIDNEYS: No stones in the kidneys or proximal ureters. No hydronephrosis. No perinephric or periureteral stranding. GI AND BOWEL: Stomach and duodenal sweep demonstrate no acute abnormality. There is no bowel obstruction. No abnormal bowel wall thickening or distension. PERITONEUM AND RETROPERITONEUM: Small amount of ascites adjacent to the liver and spleen. Stent graft repair of aortic dissection. Stent graft extends into the upper abdominal  aorta. Dissection continues throughout the abdominal aorta, unchanged since prior study . No free air. LYMPH NODES: No lymphadenopathy. BONES AND SOFT TISSUES: No acute abnormality of the visualized bones. No focal soft tissue abnormality. IMPRESSION: 1. Stent graft repair of aortic dissection with continued dissection throughout the abdominal aorta, stable since prior study . 2. Small amount of ascites adjacent to the liver and spleen. Electronically signed by: Franky Crease MD 09/07/2024 10:19 PM EST RP Workstation: [id number]   DG Chest Portable 1 View Result Date: 09/07/2024 EXAM: 1 VIEW(S) XRAY OF THE CHEST 09/07/2024 08:40:00 PM COMPARISON: None available. CLINICAL HISTORY: SOB FINDINGS: LUNGS AND PLEURA: No vascular congestion, edema, or consolidation. No pleural effusion or pneumothorax. HEART AND MEDIASTINUM: Postoperative changes in the mediastinum. Thoracic aortic stent in place. Mild cardiac enlargement. BONES AND SOFT TISSUES: Surgical clips in the base of the neck. No acute osseous abnormality. IMPRESSION: 1. No acute cardiopulmonary process. 2. Mild cardiomegaly. Electronically signed by: Elsie Gravely MD 09/07/2024 08:47 PM EST RP Workstation: [id number]     Nilda Fendt, MD, PhD [facility]  Between 7 am - 7 pm I am available, please contact me via Amion (for emergencies) or Securechat (non urgent messages)  Between 7 pm - 7 am I am not available, please contact night coverage MD/APP via Amion

## 2024-09-08 NOTE — Progress Notes (Signed)
 Introduced self to patient at bedside,explained the procedure to patient and agreed to proceed,she signed the consent.Sterile was set up ,tried to get peritoneal fluid but her belly was empty ,instilled 500 cc of 1.5 low cal/low mag dextrose  to dwell for 3 hours.explained to the patient at this moment that her PD catheter is closed and safe even if she is going to be moved to another room.Patient's nurse made aware.

## 2024-09-08 NOTE — ED Notes (Signed)
 Pt reports itchiness with IV site and the tape. Extra tape taken off, pt reports  I will try this

## 2024-09-08 NOTE — ED Notes (Signed)
 Pt. CBG 68; EDP notified; followed hypoglycemic protocols and administered some Sprite and her breakfast stopped by.

## 2024-09-08 NOTE — Consult Note (Addendum)
 Renal Service Consult Note [facility] Felicia JONETTA Fret, MD  Patient: [name] Date: 09/08/2024 Requesting Physician: Dr. Trixie  Reason for Consult: ESRD pt on PD w/ n/v/d and abdominal pain HPI: The patient is a 58 y.o. year-old w/ PMH as below who presented to ED last night c/o N/V/D x 5 days, also not eating in 3 days, gen'd weakness. PD has been going well, last done Friday night. She checks the PD effluent and stated that it hasn't been cloudy.  In ED BP 102/61, HR 94, RR 13-17, temp 97, 98% sats on room air.  K+ 3.7, BUN 40, creatinine 15.  Anion gap 18.  WBC 9K, Hgb 14.5.  CT abdomen was unremarkable.  Chest x-ray was also unremarkable.  Patient was admitted and given some home medications; also 500 cc of LR, IV Zosyn  and IV vancomycin  for possible infection/peritonitis.  We are asked to see for ESRD.  Pt seen in room. PD has been going well. No cloudy fluid. Abd sore and hurts only when you push on it.  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  No surgery was required and she has followed up with her neurosurgeon outpatient, who took her off her blood pressure pressure medicine because her blood pressures was low.  He also told her that she does not need any surgery at this time and that they will repeat the MRI soon.    ROS - denies CP, no joint pain, no HA, no blurry vision, no rash   Past Medical History  Past Medical History:  Diagnosis Date   Acute pain of right shoulder due to trauma 06/08/2023   Acute pancreatitis 10/23/2021   Acute renal failure superimposed on stage 4 chronic kidney disease (HCC) 10/24/2021   Allergy    Anemia    Normocytic   Antibiotic-induced yeast infection 09/28/2020   Anxiety    Asthma    Bronchitis 2005   Bursitis of left shoulder 09/06/2019   CKD (chronic kidney disease)    Dialysis on Mon-Wed- Fri   CLASS 1-EXOPHTHALMOS-THYROTOXIC 02/08/2007    Cognitive dysfunction 02/21/2020   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  Genes Analyzed (43 total): APC*, ATM*, AXIN2, BARD1, BMPR1A, BRCA1*, BRCA2*, BRIP1*, CDH1*, CDK4, CDKN2A, CHEK2*, DICER1, GALNT12, HOXB13, MEN1, MLH1*, MRE11A, MSH2*, MSH3, MSH6*, MUTYH*, NBN, NF1*, NTHL1, PALB2*, PMS2*, POLD1, POLE, PTEN*, RAD50, RAD51C*, RAD51D*, RET, SDHB, SDHD, SMAD4, SMARCA4, STK11 and TP53* (sequencing and deletion/duplication); EGFR (s   GERD 07/24/2006   GRAVE'S DISEASE 01/01/2008   History of hidradenitis suppurativa    History of kidney stones    History of thrush    HIV DISEASE 07/24/2006   dx March 05   Hyperlipidemia    HYPERTENSION 07/24/2006   Hyperthyroidism 08/2006   Grave's Disease -diffuse radiotracer uptake 08/25/06 Thyroid  scan-Cold nodule to R lower lobe of  thyrorid   Ileus (HCC) 02/14/2023   Left bundle branch block (LBBB)    Malnutrition of moderate degree 02/08/2023   Menometrorrhagia    hx of   Nephrolithiasis    Panniculitis 05/12/2020   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 Completed 2 yrs Prednisone  after lung bx confirmation. No symptoms since then.   Sebaceous cyst 04/21/2020   Suppurative hidradenitis    Thyroid  cancer (HCC)    THYROID  NODULE, RIGHT 02/08/2007   Past Surgical History  Past Surgical History:  Procedure Laterality Date   AORTIC ARCH DEBRANCHING N/A 08/25/2023   Procedure: AORTIC ARCH DEBRANCHING USING 12X8X8MM HEMASHIELD GRAFT;  Surgeon: Lucas Dorise POUR, MD;  Location: [facility] OR;  Service: Open Heart Surgery;  Laterality: N/A;  with bypasses to the innominate and left common carotid arteries   APPLICATION OF WOUND VAC N/A 01/20/2021   Procedure: APPLICATION OF WOUND VAC;  Surgeon: Lowery Estefana RAMAN, DO;  Location: [facility];  Service: Plastics;  Laterality: N/A;   BREAST EXCISIONAL BIOPSY Right 04/26/2018   right axilla negative   BREAST EXCISIONAL BIOPSY Left 04/26/2018   left axilla negative   BREAST LUMPECTOMY Right 10/03/2018   malignant   BREAST LUMPECTOMY WITH RADIOACTIVE SEED AND SENTINEL LYMPH NODE BIOPSY Right 10/03/2018   Procedure: RIGHT BREAST LUMPECTOMY WITH RADIOACTIVE SEED AND SENTINEL LYMPH NODE MAPPING;  Surgeon: Vanderbilt Ned, MD;  Location: [facility];  Service: General;  Laterality: Right;   BREAST SURGERY  1997   Breast Reduction    CAPD INSERTION N/A 12/21/2023   Procedure: LAPAROSCOPIC INSERTION CONTINUOUS AMBULATORY PERITONEAL DIALYSIS  (CAPD) CATHETER; LAPAROSCOPIC OMENTUMPEXY;  Surgeon: Magda Ned SAILOR, MD;  Location: [facility];  Service: Vascular;  Laterality: N/A;   CYSTOSCOPY W/ URETERAL STENT REMOVAL  11/09/2012   Procedure: CYSTOSCOPY WITH STENT REMOVAL;  Surgeon: Ricardo Likens, MD;  Location: [facility];  Service: Urology;  Laterality:  Right;   CYSTOSCOPY WITH RETROGRADE PYELOGRAM, URETEROSCOPY AND STENT PLACEMENT  11/09/2012   Procedure: CYSTOSCOPY WITH RETROGRADE PYELOGRAM, URETEROSCOPY AND STENT PLACEMENT;  Surgeon: Ricardo Likens, MD;  Location: [facility];  Service: Urology;  Laterality: Left;  LEFT URETEROSCOPY, STONE MANIPULATION, left STENT exchange    CYSTOSCOPY WITH STENT PLACEMENT  10/02/2012   Procedure: CYSTOSCOPY WITH STENT PLACEMENT;  Surgeon: Ricardo Likens, MD;  Location: [facility];  Service: Urology;  Laterality: Left;   DEBRIDEMENT AND CLOSURE WOUND N/A 01/20/2021   Procedure: Excision of abdominal wound with closure;  Surgeon: Lowery Estefana RAMAN, DO;  Location: [facility];  Service: Plastics;  Laterality: N/A;   DILATION AND CURETTAGE OF UTERUS  11/2002   s/p for 1st trimester nonviable pregnancy   EYE SURGERY     sty under eyelid   INCISE AND DRAIN ABCESS  08/2002   s/p I &D for righ inframmary fold hidradenitis   INCISION AND DRAINAGE PERITONSILLAR ABSCESS  12/2001   IR CV LINE  INJECTION  06/07/2018   IR FLUORO GUIDE CV LINE RIGHT  01/30/2023   IR IMAGING GUIDED PORT INSERTION  06/20/2018   IR REMOVAL TUN ACCESS W/ PORT W/O FL MOD SED  06/20/2018   IR US  GUIDE VASC ACCESS RIGHT  01/30/2023   IRRIGATION AND DEBRIDEMENT ABSCESS  01/31/2012   Procedure: IRRIGATION AND DEBRIDEMENT ABSCESS;  Surgeon: Alm VEAR Angle, MD;  Location: [facility];  Service: General;  Laterality: Right;  right breast and axilla    LAPAROSCOPIC REPOSITIONING CAPD CATHETER Left 01/10/2024   Procedure: Removal of peritoneal dialysis Catheter.;  Surgeon: Magda Debby SAILOR, MD;  Location: [facility] OR;  Service: Vascular;  Laterality: Left;   LAPAROSCOPY N/A 01/10/2024   Procedure: LAPAROSCOPY, DIAGNOSTIC;  Surgeon: Magda Debby SAILOR, MD;  Location: [facility] OR;  Service: Vascular;  Laterality: N/A;   NEPHROLITHOTOMY  10/02/2012   Procedure: NEPHROLITHOTOMY PERCUTANEOUS;  Surgeon: Ricardo Likens, MD;  Location: [facility];  Service: Urology;   Laterality: Right;  First Stage Percutaneous Nephrolithotomy with Surgeon Access, Left Ureteral Stent     NEPHROLITHOTOMY  10/04/2012   Procedure: NEPHROLITHOTOMY PERCUTANEOUS SECOND LOOK;  Surgeon: Ricardo Likens, MD;  Location: [facility];  Service: Urology;  Laterality: Right;      NEPHROLITHOTOMY  10/08/2012   Procedure: NEPHROLITHOTOMY PERCUTANEOUS;  Surgeon: Ricardo Likens, MD;  Location: [facility];  Service: Urology;  Laterality: Right;  THIRD STAGE, nephrostomy tube exchange x 2   NEPHROLITHOTOMY  10/11/2012   Procedure: NEPHROLITHOTOMY PERCUTANEOUS SECOND LOOK;  Surgeon: Ricardo Likens, MD;  Location: [facility];  Service: Urology;  Laterality: Right;  RIGHT 4 STAGE PERCUTANOUS NEPHROLITHOTOMY, right URETEROSCOPY WITH HOLMIUM LASER    PANNICULECTOMY N/A 12/21/2020   Procedure: PANNICULECTOMY;  Surgeon: Lowery Estefana RAMAN, DO;  Location: [facility];  Service: Plastics;  Laterality: N/A;   PERCUTANEOUS NEPHROSTOLITHOTOMY  04/2022   PORT-A-CATH REMOVAL N/A 07/16/2020   Procedure: REMOVAL PORT-A-CATH;  Surgeon: Vanderbilt Debby, MD;  Location: [facility];  Service: General;  Laterality: N/A;   PORTACATH PLACEMENT Left 05/17/2018   Procedure: INSERTION PORT-A-CATH;  Surgeon: Vernetta Berg, MD;  Location: [facility];  Service: General;  Laterality: Left;   RADICAL NECK DISSECTION  04/12/2017   limited/notes 04/12/2017   RADICAL NECK DISSECTION N/A 04/12/2017   Procedure: RADICAL NECK DISSECTION;  Surgeon: Carlie Clark, MD;  Location: [facility] OR;  Service: ENT;  Laterality: N/A;  limited neck dissection 2 hours total   REDUCTION MAMMAPLASTY Bilateral 1998   RIGHT/LEFT HEART CATH AND CORONARY ANGIOGRAPHY N/A 03/12/2020   Procedure: RIGHT/LEFT HEART CATH AND CORONARY ANGIOGRAPHY;  Surgeon: Cherrie Toribio SAUNDERS, MD;  [facility];  Service: Cardiovascular;  Laterality: N/A;   [place]  1994   TEE WITHOUT CARDIOVERSION N/A 08/25/2023   Procedure: TRANSESOPHAGEAL  ECHOCARDIOGRAM;  Surgeon: Lucas Dorise POUR, MD;  Location: [facility];  Service: Open Heart Surgery;  Laterality: N/A;   TENCKHOFF CATHETER INSERTION Left 01/10/2024   Procedure: INSERTION, CATHETER, DIALYSIS, PERITONEAL;  Surgeon: Magda Debby SAILOR, MD;  Location: [facility];  Service: Vascular;  Laterality: Left;   THORACIC AORTIC ENDOVASCULAR STENT GRAFT N/A 02/02/2023   Procedure: THORACIC AORTIC ENDOVASCULAR STENT GRAFT;  Surgeon: Lanis Fonda BRAVO, MD;  Location: [facility] OR;  Service: Vascular;  Laterality: N/A;   THORACIC AORTIC ENDOVASCULAR STENT GRAFT N/A 08/25/2023   Procedure: THORACIC AORTIC ENDOVASCULAR STENT GRAFT;  Surgeon: Lanis Fonda BRAVO, MD;  Location: [facility] OR;  Service: Vascular;  Laterality: N/A;   THYROIDECTOMY  04/12/2017   completion/notes 04/12/2017  THYROIDECTOMY N/A 04/12/2017   Procedure: THYROIDECTOMY;  Surgeon: Carlie Clark, MD;  Location: [facility] OR;  Service: ENT;  Laterality: N/A;  Completion Thyroidectomy   TOTAL THYROIDECTOMY  2010   ULTRASOUND GUIDANCE FOR VASCULAR ACCESS Right 08/25/2023   Procedure: ULTRASOUND GUIDANCE FOR VASCULAR ACCESS, RIGHT FEMORAL ARTERY;  Surgeon: Lanis Fonda BRAVO, MD;  Location[facility];  Service: Vascular;  Laterality: Right;   Family History  Family History  Problem Relation Age of Onset   Hypertension Mother    Cancer Mother        laryngeal   Heart disease Mother        stent   Pancreatic cancer Father    Hypertension Father    Lung cancer Father 17       hx smoking   Hypertension Sister    Hypertension Sister    Hypertension Brother    Breast cancer Maternal Aunt 71   Breast cancer Maternal Aunt        dx 60+   Cancer Maternal Uncle        Lung CA   Breast cancer Paternal Aunt 75   Breast cancer Paternal Aunt        dx 30's   Breast cancer Paternal Aunt        dx 50's   Prostate cancer Paternal Uncle    Prostate cancer Paternal Uncle    Lung cancer Paternal Uncle    Breast cancer Cousin 73   Breast cancer Cousin        dx <50    Breast cancer Cousin        dx <50   Breast cancer Cousin        dx <50   Heart disease Other    Hypertension Other    Stroke Other        Grandparent   Kidney disease Other        Grandparent   Diabetes Other        FH of Diabetes   Colon cancer Neg Hx    Esophageal cancer Neg Hx    Rectal cancer Neg Hx    Stomach cancer Neg Hx    Social History  reports that she quit smoking about 6 years ago. Her smoking use included cigarettes. She started smoking about 7 years ago. She has a 7.5 pack-year smoking history. She has never used smokeless tobacco. She reports that she does not currently use alcohol . She reports that she does not use drugs. Allergies  Allergies  Allergen Reactions   Genvoya [Elviteg-Cobic-Emtricit-Tenofaf] Hives   Lisinopril Cough   Aldactone  [Spironolactone ] Hives   Valium  [Diazepam ] Other (See Comments)    Lethargy (pt states she cannot take this again)   Tegaderm Ag Mesh [Silver] Itching   Home medications Prior to Admission medications   Medication Sig Start Date End Date Taking? Authorizing Provider  acetaminophen  (TYLENOL ) 500 MG tablet Take 1,000 mg by mouth every 6 (six) hours as needed for mild pain.   Yes [provider]  allopurinol  (ZYLOPRIM ) 100 MG tablet TAKE 1 TABLET (100 MG TOTAL) BY MOUTH DAILY. FOR GOUT PREVENTION 04/11/24  Yes Clark, Katherine K, NP  busPIRone  (BUSPAR ) 5 MG tablet TAKE 1 TABLET BY MOUTH TWICE A DAY FOR ANXIETY 08/26/24  Yes Clark, Katherine K, NP  dolutegravir  ([name] ) 50 MG tablet Take 1 tablet (50 mg total) by mouth daily. 05/13/24  Yes Melvenia Corean SAILOR, NP  emtricitabine -tenofovir  AF (DESCOVY ) 200-25 MG tablet Take 1 tablet by mouth daily.  05/13/24  Yes Melvenia Corean SAILOR, NP  fluticasone  (FLOVENT  HFA) 110 MCG/ACT inhaler Inhale 1 puff into the lungs 2 (two) times daily.   Yes [provider]  levocetirizine (XYZAL ) 5 MG tablet TAKE 1 TABLET BY MOUTH EVERY DAY IN THE EVENING FOR ALLERGIES 05/12/24  Yes Clark,  Katherine K, NP  methocarbamol  (ROBAXIN ) 500 MG tablet Take 1 tablet (500 mg total) by mouth every 6 (six) hours as needed for muscle spasms. 08/30/24  Yes Janjua, Rashid M, MD  minocycline  (MINOCIN ) 100 MG capsule Take 1 capsule (100 mg total) by mouth 2 (two) times daily. 03/13/24  Yes Melvenia Corean SAILOR, NP  nystatin  (MYCOSTATIN ) 100000 UNIT/ML suspension Take 5 mLs by mouth 4 (four) times daily. 09/05/24  Yes [provider]  omeprazole  (PRILOSEC) 40 MG capsule Take 1 capsule (40 mg total) by mouth daily. for heartburn. 04/04/24  Yes Clark, Katherine K, NP  ondansetron  (ZOFRAN -ODT) 4 MG disintegrating tablet Take 1 tablet (4 mg total) by mouth every 8 (eight) hours as needed for nausea or vomiting. 08/28/24  Yes Gretta Comer POUR, NP  rosuvastatin  (CRESTOR ) 10 MG tablet TAKE 1 TABLET BY MOUTH EVERY DAY FOR CHOLESTEROL 03/21/24  Yes Clark, Katherine K, NP  saxagliptin  HCl (ONGLYZA) 2.5 MG TABS tablet TAKE 1 TABLET (2.5 MG TOTAL) BY MOUTH DAILY. FOR DIABETES. 07/20/24  Yes Clark, Katherine K, NP  sevelamer  carbonate (RENVELA ) 800 MG tablet Take 800 mg by mouth 3 (three) times daily with meals. 08/07/24  Yes [provider]  SYNTHROID  150 MCG tablet TAKE 1 TABLET BY MOUTH DAILY BEFORE BREAKFAST. 03/12/24  Yes Trixie File, MD  albuterol  (VENTOLIN  HFA) 108 (90 Base) MCG/ACT inhaler INHALE 2 PUFFS INTO THE LUNGS UP TO EVERY 6HRS AS NEEDED FOR WHEEZING/SHORTNESS OF BREATH Patient not taking: Reported on 09/07/2024 09/06/23   Gretta Comer POUR, NP  AURYXIA  1 GM 210 MG(Fe) tablet Take 210 mg by mouth 3 (three) times daily with meals. Patient not taking: Reported on 09/07/2024 04/05/23   [provider]  calcitRIOL (ROCALTROL) 0.25 MCG capsule Take 0.25 mcg by mouth 3 (three) times a week. Patient not taking: Reported on 09/07/2024 08/07/24   [provider]  gabapentin  (NEURONTIN ) 100 MG capsule Take 1 capsule (100 mg total) by mouth 3 (three) times daily. Patient not  taking: Reported on 09/07/2024 08/15/24   Lanis Fonda BRAVO, MD  HYDROcodone -acetaminophen  (NORCO/VICODIN) 5-325 MG tablet Take 1 tablet by mouth every 6 (six) hours as needed. Patient not taking: Reported on 08/28/2024 08/04/24   Arlice Reichert, MD  Lancets [facility] ULTRASOFT) lancets Use as instructed to test blood sugar daily 02/15/24   Clark, Katherine K, NP  lidocaine  (LIDODERM ) 5 % PLACE 1 PATCH ONTO THE SKIN DAILY. REMOVE & DISCARD PATCH WITHIN 12 HOURS OR AS DIRECTED BY MD Patient not taking: Reported on 08/28/2024 04/21/24   Clark, Katherine K, NP  losartan  (COZAAR ) 100 MG tablet Take 100 mg by mouth daily. Patient not taking: Reported on 08/28/2024 08/06/24   [provider]  losartan  (COZAAR ) 50 MG tablet Take 50 mg by mouth daily. Patient not taking: Reported on 08/28/2024 08/10/24   [provider]     Vitals:   09/08/24 0500 09/08/24 0518 09/08/24 0521 09/08/24 0600  BP: (!) 100/59 (!) 106/54    Pulse:      Resp:   14 15  Temp:      TempSrc:      SpO2:      Weight:  Height:       Exam Gen alert, no distress, on RA Sclera anicteric, throat clear  No jvd or bruits Chest clear bilat to bases RRR no MRG Abd soft ntnd, mild tenderness w/ palpation, no rebound Ext no LE or UE edema, no other edema Neuro is alert, Ox 3 , nf    PD cath mid-abdomen  Home bp meds: Losartan  50- 100mg  daily    OP PD: CCPD 7x week-[facility] 5 exchanges, fill vol 2250, 1.5hr dwell time 1/2 1.5% bags; 1/2 2.5% bags (usual per pt)   K+ 3.7, BUN 40, creat 15  Ca 8.3     Assessment/ Plan: N/V/D/ abd pain: concern for possible PD cath related peritonitis. Empiric IV abx were started overnight. We will send off PD fluid sometime today for GS, culture and cell count.  ESRD: on CCPD nightly. Last PD Friday night. Plan HD nightly while here.  BP: home bp meds on hold, BP's have not been high lately and are in fact borderline low.  Volume: no vol excess on exam.  Follow.  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 HIV   Myer Fret  MD [facility] 09/08/2024, 6:20 AM  Recent Labs  Lab 09/07/24 1922 09/08/24 0527  HGB 12.1 10.5*  ALBUMIN  2.3*  --   CALCIUM  8.8* 8.3*  CREATININE 15.35* 15.88*  K 3.8 3.7   Inpatient medications:  allopurinol   100 mg Oral Daily   busPIRone   5 mg Oral BID   dolutegravir   50 mg Oral Daily   emtricitabine -tenofovir  AF  1 tablet Oral Daily   fluticasone   1 puff Inhalation BID   insulin  aspart  0-6 Units Subcutaneous Q4H   levothyroxine   150 mcg Oral QAC breakfast   lidocaine   1 patch Transdermal Q24H   loratadine   10 mg Oral QPM   nystatin   5 mL Oral QID   pantoprazole   40 mg Oral Daily   rosuvastatin   10 mg Oral Daily   sevelamer  carbonate  800 mg Oral TID WC   vancomycin  variable dose per unstable renal function (pharmacist dosing)   Does not apply See admin instructions    piperacillin -tazobactam (ZOSYN )  IV 2.25 g (09/08/24 0554)   vancomycin  2,000 mg (09/08/24 0553)   albuterol , HYDROcodone -acetaminophen , methocarbamol , naLOXone  (NARCAN )  injection

## 2024-09-08 NOTE — Progress Notes (Addendum)
 With aseptic technique,500 cc ,drained out amber looking peritoneal fluid with scattered fine fibrin floating with it,50cc of it collected and hand carried to micro lab. Renal MD made aware.

## 2024-09-08 NOTE — ED Notes (Signed)
Dialysis RN at [facility]

## 2024-09-08 NOTE — H&P (Signed)
 History and Physical    Lubertha Leite FMW:[id number] DOB: [birth year] DOA: 09/07/2024  PCP: Gretta Comer POUR, NP  Patient coming from: Home  Chief Complaint: Vomiting, diarrhea  HPI: [name] is a 58 y.o. 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  She was admitted to the hospital last month 10/14-10/19 for severe headache, nausea, vomiting, blurry vision, photophobia, and trouble walking.  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  There was secondary mass effect on the optic chiasm, worse on the left, but no overt cavernous sinus invasion.  Patient was seen by neurosurgery and discharged on Medrol  dose pack with plan for outpatient follow-up.  Amitriptyline  was stopped during this hospitalization due to concern for tremors.   Patient presents to the ED tonight with complaints of vomiting and diarrhea.  Patient states she has not been able to eat for the past one week due to having nausea and vomiting.  Also having watery nonbloody diarrhea.  States she was treated with antibiotics for UTI a month ago but no longer having any urinary symptoms.  Denies fevers.  She is also reporting generalized abdominal pain but states this is only when her abdomen is palpated.  She does peritoneal dialysis every night at home and states her dialysate fluid has been clear.  Last episode of vomiting was yesterday afternoon.  She is also reporting chronic low back  pain for the past 6 months for which she is requesting pain medication.  She takes hydrocodone , Tylenol , and a muscle relaxer at home.  Denies any recent falls or back trauma.  Denies saddle anesthesia.  No lower extremity weakness.  States she was evaluated by her PCP a few days ago and prescribed oral nystatin  suspension for oral thrush.  Reporting mild dyspnea on exertion for the past few days but denies chest pain.  ED Course: Afebrile. BP soft.  Labs notable for WBC count 10.5, hemoglobin 12.1 (improved), potassium 3.8, bicarb 22, alk phos 228, transaminases and T. bili normal, lipase normal, UA pending, COVID/influenza/RSV PCR negative.  Chest x-ray showing mild cardiomegaly and no acute cardiopulmonary process.  CT abdomen pelvis showing stent graft repair of aortic dissection with continued dissection throughout the abdominal aorta, stable since prior study.  Showing small amount of ascites adjacent to the liver and spleen.  ED physician discussed the case with nephrologist Dr. Geralynn who recommended admission for IV antibiotics and they will evaluate the patient tomorrow for peritonitis with fluid analysis.  Patient was given fentanyl , Zosyn , and 500 mL LR in the ED.  Review of Systems:  Review of Systems  All other systems reviewed and are negative.   Past Medical History:  Diagnosis Date   Acute pain of right shoulder due to trauma 06/08/2023   Acute pancreatitis 10/23/2021   Acute renal failure superimposed on stage 4 chronic kidney disease (HCC) 10/24/2021   Allergy    Anemia    Normocytic   Antibiotic-induced yeast infection 09/28/2020   Anxiety    Asthma    Bronchitis 2005   Bursitis of left shoulder 09/06/2019   CKD (chronic kidney disease)    Dialysis  on Mon-Wed- Fri   CLASS 1-EXOPHTHALMOS-THYROTOXIC 02/08/2007   Cognitive dysfunction 02/21/2020   COVID-19 long hauler 05/11/2021   COVID-19 virus infection 04/28/2022   Diabetes mellitus without complication (HCC)    type  2   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  Genes Analyzed (43 total): APC*, ATM*, AXIN2, BARD1, BMPR1A, BRCA1*, BRCA2*, BRIP1*, CDH1*, CDK4, CDKN2A, CHEK2*, DICER1, GALNT12, HOXB13, MEN1, MLH1*, MRE11A, MSH2*, MSH3, MSH6*, MUTYH*, NBN, NF1*, NTHL1, PALB2*, PMS2*, POLD1, POLE, PTEN*, RAD50, RAD51C*, RAD51D*, RET, SDHB, SDHD, SMAD4, SMARCA4, STK11 and TP53* (sequencing and deletion/duplication); EGFR (s   GERD 07/24/2006   GRAVE'S DISEASE 01/01/2008   History of hidradenitis suppurativa    History of kidney stones    History of thrush    HIV DISEASE 07/24/2006   dx March 05   Hyperlipidemia    HYPERTENSION 07/24/2006   Hyperthyroidism 08/2006   Grave's Disease -diffuse radiotracer uptake 08/25/06 Thyroid  scan-Cold nodule to R lower lobe of thyrorid   Ileus (HCC) 02/14/2023   Left bundle branch block (LBBB)    Malnutrition of moderate degree 02/08/2023   Menometrorrhagia    hx of   Nephrolithiasis    Panniculitis 05/12/2020   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 Completed 2 yrs Prednisone  after lung bx confirmation. No symptoms since then.   Sebaceous cyst 04/21/2020   Suppurative hidradenitis    Thyroid  cancer (HCC)    THYROID  NODULE, RIGHT 02/08/2007    Past Surgical History:  Procedure Laterality Date   AORTIC ARCH DEBRANCHING N/A 08/25/2023   Procedure: AORTIC ARCH DEBRANCHING USING 12X8X8MM HEMASHIELD GRAFT;  Surgeon: Lucas Dorise POUR, MD;  Location: [facility];  Service: Open Heart Surgery;  Laterality: N/A;  with bypasses to the innominate and left common carotid arteries   APPLICATION OF WOUND VAC N/A 01/20/2021   Procedure: APPLICATION OF WOUND VAC;  Surgeon: Lowery Estefana RAMAN, DO;  Location: [facility];  Service: Plastics;  Laterality: N/A;   BREAST EXCISIONAL BIOPSY Right 04/26/2018   right axilla negative   BREAST EXCISIONAL BIOPSY Left 04/26/2018   left axilla negative   BREAST LUMPECTOMY Right 10/03/2018   malignant   BREAST LUMPECTOMY WITH RADIOACTIVE SEED AND SENTINEL LYMPH NODE BIOPSY Right 10/03/2018   Procedure: RIGHT BREAST LUMPECTOMY WITH RADIOACTIVE SEED AND SENTINEL LYMPH NODE MAPPING;  Surgeon: Vanderbilt Ned, MD;  Location: [facility];  Service: General;  Laterality: Right;   BREAST SURGERY  1997   Breast Reduction  CAPD INSERTION N/A 12/21/2023   Procedure: LAPAROSCOPIC INSERTION CONTINUOUS AMBULATORY PERITONEAL DIALYSIS  (CAPD) CATHETER; LAPAROSCOPIC OMENTUMPEXY;  Surgeon: Magda Debby SAILOR, MD;  Location: [facility];  Service: Vascular;  Laterality: N/A;   CYSTOSCOPY W/ URETERAL STENT REMOVAL  11/09/2012   Procedure: CYSTOSCOPY WITH STENT REMOVAL;  Surgeon: Ricardo Likens, MD;  Location: [facility];  Service: Urology;  Laterality: Right;   CYSTOSCOPY WITH RETROGRADE PYELOGRAM, URETEROSCOPY AND STENT PLACEMENT  11/09/2012   Procedure: CYSTOSCOPY WITH RETROGRADE PYELOGRAM, URETEROSCOPY AND STENT PLACEMENT;   Surgeon: Ricardo Likens, MD;  Location: [facility];  Service: Urology;  Laterality: Left;  LEFT URETEROSCOPY, STONE MANIPULATION, left STENT exchange    CYSTOSCOPY WITH STENT PLACEMENT  10/02/2012   Procedure: CYSTOSCOPY WITH STENT PLACEMENT;  Surgeon: Ricardo Likens, MD;  Location: [facility];  Service: Urology;  Laterality: Left;   DEBRIDEMENT AND CLOSURE WOUND N/A 01/20/2021   Procedure: Excision of abdominal wound with closure;  Surgeon: Lowery Estefana RAMAN, DO;  Location: [facility];  Service: Plastics;  Laterality: N/A;   DILATION AND CURETTAGE OF UTERUS  11/2002   s/p for 1st trimester nonviable pregnancy   EYE SURGERY     sty under eyelid   INCISE AND DRAIN ABCESS  08/2002   s/p I &D for righ inframmary fold hidradenitis   INCISION AND DRAINAGE PERITONSILLAR ABSCESS  12/2001   IR CV LINE INJECTION  06/07/2018   IR FLUORO GUIDE CV LINE RIGHT  01/30/2023   IR IMAGING GUIDED PORT INSERTION  06/20/2018   IR REMOVAL TUN ACCESS W/ PORT W/O FL MOD SED  06/20/2018   IR US  GUIDE VASC ACCESS RIGHT  01/30/2023   IRRIGATION AND DEBRIDEMENT ABSCESS  01/31/2012   Procedure: IRRIGATION AND DEBRIDEMENT ABSCESS;  Surgeon: Alm VEAR Angle, MD;  Location: [facility];  Service: General;  Laterality: Right;  right breast and axilla    LAPAROSCOPIC REPOSITIONING CAPD CATHETER Left 01/10/2024   Procedure: Removal of peritoneal dialysis Catheter.;  Surgeon: Magda Debby SAILOR, MD;  Location: [facility] OR;  Service: Vascular;  Laterality: Left;   LAPAROSCOPY N/A 01/10/2024   Procedure: LAPAROSCOPY, DIAGNOSTIC;  Surgeon: Magda Debby SAILOR, MD;  Location: [facility] OR;  Service: Vascular;  Laterality: N/A;   NEPHROLITHOTOMY  10/02/2012   Procedure: NEPHROLITHOTOMY PERCUTANEOUS;  Surgeon: Ricardo Likens, MD;  Location: [facility];  Service: Urology;  Laterality: Right;  First Stage Percutaneous Nephrolithotomy with Surgeon Access, Left Ureteral Stent     NEPHROLITHOTOMY  10/04/2012   Procedure: NEPHROLITHOTOMY PERCUTANEOUS SECOND  LOOK;  Surgeon: Ricardo Likens, MD;  [facility]: [facility];  Service: Urology;  Laterality: Right;      NEPHROLITHOTOMY  10/08/2012   Procedure: NEPHROLITHOTOMY PERCUTANEOUS;  Surgeon: Ricardo Likens, MD;  Location: [facility];  Service: Urology;  Laterality: Right;  THIRD STAGE, nephrostomy tube exchange x 2   NEPHROLITHOTOMY  10/11/2012   Procedure: NEPHROLITHOTOMY PERCUTANEOUS SECOND LOOK;  Surgeon: Ricardo Likens, MD;  Location: [facility];  Service: Urology;  Laterality: Right;  RIGHT 4 STAGE PERCUTANOUS NEPHROLITHOTOMY, right URETEROSCOPY WITH HOLMIUM LASER    PANNICULECTOMY N/A 12/21/2020   Procedure: PANNICULECTOMY;  Surgeon: Lowery Estefana RAMAN, DO;  [facility]: [facility];  Service: Plastics;  Laterality: N/A;   PERCUTANEOUS NEPHROSTOLITHOTOMY  04/2022   PORT-A-CATH REMOVAL N/A 07/16/2020   Procedure: REMOVAL PORT-A-CATH;  Surgeon: Vanderbilt Debby, MD;  Location: [facility];  Service: General;  Laterality: N/A;   PORTACATH PLACEMENT Left 05/17/2018   Procedure: INSERTION PORT-A-CATH;  Surgeon: Vernetta Berg, MD;  Location: [facility];  Service: General;  Laterality: Left;   RADICAL NECK DISSECTION  04/12/2017   limited/notes 04/12/2017   RADICAL NECK DISSECTION N/A 04/12/2017   Procedure: RADICAL NECK DISSECTION;  Surgeon: Carlie Clark, MD;  Location: [facility] OR;  Service: ENT;  Laterality: N/A;  limited neck dissection 2 hours total   REDUCTION MAMMAPLASTY Bilateral 1998   RIGHT/LEFT HEART CATH AND CORONARY ANGIOGRAPHY N/A 03/12/2020   Procedure: RIGHT/LEFT HEART CATH AND CORONARY ANGIOGRAPHY;  Surgeon: Cherrie Toribio SAUNDERS, MD;  [facility];  Service: Cardiovascular;  Laterality: N/A;   [place]  1994   TEE WITHOUT CARDIOVERSION N/A 08/25/2023   Procedure: TRANSESOPHAGEAL ECHOCARDIOGRAM;  Surgeon: Lucas Dorise POUR, MD;  Location: [facility];  Service: Open Heart Surgery;  Laterality: N/A;   TENCKHOFF CATHETER INSERTION Left 01/10/2024   Procedure: INSERTION,  CATHETER, DIALYSIS, PERITONEAL;  Surgeon: Magda Debby SAILOR, MD;  Location: [facility];  Service: Vascular;  Laterality: Left;   THORACIC AORTIC ENDOVASCULAR STENT GRAFT N/A 02/02/2023   Procedure: THORACIC AORTIC ENDOVASCULAR STENT GRAFT;  Surgeon: Lanis Fonda BRAVO, MD;  Location: [facility] OR;  Service: Vascular;  Laterality: N/A;   THORACIC AORTIC ENDOVASCULAR STENT GRAFT N/A 08/25/2023   Procedure: THORACIC AORTIC ENDOVASCULAR STENT GRAFT;  Surgeon: Lanis Fonda BRAVO, MD;  Location: [facility] OR;  Service: Vascular;  Laterality: N/A;   THYROIDECTOMY  04/12/2017   completion/notes 04/12/2017   THYROIDECTOMY N/A 04/12/2017   Procedure: THYROIDECTOMY;  Surgeon: Carlie Clark, MD;  Location: [facility] OR;  Service: ENT;  Laterality: N/A;  Completion Thyroidectomy   TOTAL THYROIDECTOMY  2010   ULTRASOUND GUIDANCE FOR VASCULAR ACCESS Right 08/25/2023   Procedure: ULTRASOUND GUIDANCE FOR VASCULAR ACCESS, RIGHT FEMORAL ARTERY;  Surgeon: Lanis Fonda BRAVO, MD;  Location: [facility];  Service: Vascular;  Laterality: Right;     reports that she quit smoking about 6 years ago. Her smoking use included cigarettes. She started smoking about 7 years ago. She has a 7.5 pack-year smoking history. She has never used smokeless tobacco. She reports that she does not currently use alcohol . She reports that she does not use drugs.  Allergies  Allergen Reactions   Genvoya [Elviteg-Cobic-Emtricit-Tenofaf] Hives   Lisinopril Cough   Aldactone  [Spironolactone ] Hives   Valium  [Diazepam ] Other (See Comments)    Lethargy (pt states she cannot take this again)   Tegaderm Ag Mesh [Silver] Itching    Family History  Problem Relation Age of Onset   Hypertension Mother    Cancer Mother        laryngeal   Heart disease Mother        stent   Pancreatic cancer Father    Hypertension Father    Lung cancer Father 40       hx smoking   Hypertension Sister    Hypertension Sister    Hypertension Brother    Breast cancer Maternal Aunt 45   Breast  cancer Maternal Aunt        dx 60+   Cancer Maternal Uncle        Lung CA   Breast cancer Paternal Aunt 64   Breast cancer Paternal Aunt        dx 61's   Breast cancer Paternal Aunt        dx 50's   Prostate cancer Paternal Uncle    Prostate cancer Paternal Uncle    Lung cancer Paternal Uncle    Breast cancer Cousin 58   Breast cancer Cousin        dx <  50   Breast cancer Cousin        dx <50   Breast cancer Cousin        dx <50   Heart disease Other    Hypertension Other    Stroke Other        Grandparent   Kidney disease Other        Grandparent   Diabetes Other        FH of Diabetes   Colon cancer Neg Hx    Esophageal cancer Neg Hx    Rectal cancer Neg Hx    Stomach cancer Neg Hx     Prior to Admission medications   Medication Sig Start Date End Date Taking? Authorizing Provider  acetaminophen  (TYLENOL ) 500 MG tablet Take 1,000 mg by mouth every 6 (six) hours as needed for mild pain.   Yes [provider]  allopurinol  (ZYLOPRIM ) 100 MG tablet TAKE 1 TABLET (100 MG TOTAL) BY MOUTH DAILY. FOR GOUT PREVENTION 04/11/24  Yes Clark, Katherine K, NP  busPIRone  (BUSPAR ) 5 MG tablet TAKE 1 TABLET BY MOUTH TWICE A DAY FOR ANXIETY 08/26/24  Yes Clark, Katherine K, NP  dolutegravir  ([name] ) 50 MG tablet Take 1 tablet (50 mg total) by mouth daily. 05/13/24  Yes Melvenia Corean SAILOR, NP  emtricitabine -tenofovir  AF (DESCOVY ) 200-25 MG tablet Take 1 tablet by mouth daily. 05/13/24  Yes Melvenia Corean SAILOR, NP  fluticasone  (FLOVENT  HFA) 110 MCG/ACT inhaler Inhale 1 puff into the lungs 2 (two) times daily.   Yes [provider]  levocetirizine (XYZAL ) 5 MG tablet TAKE 1 TABLET BY MOUTH EVERY DAY IN THE EVENING FOR ALLERGIES 05/12/24  Yes Clark, Katherine K, NP  methocarbamol  (ROBAXIN ) 500 MG tablet Take 1 tablet (500 mg total) by mouth every 6 (six) hours as needed for muscle spasms. 08/30/24  Yes Janjua, Rashid M, MD  minocycline  (MINOCIN ) 100 MG capsule Take 1 capsule (100 mg  total) by mouth 2 (two) times daily. 03/13/24  Yes Melvenia Corean SAILOR, NP  nystatin  (MYCOSTATIN ) 100000 UNIT/ML suspension Take 5 mLs by mouth 4 (four) times daily. 09/05/24  Yes [provider]  omeprazole  (PRILOSEC) 40 MG capsule Take 1 capsule (40 mg total) by mouth daily. for heartburn. 04/04/24  Yes Clark, Katherine K, NP  ondansetron  (ZOFRAN -ODT) 4 MG disintegrating tablet Take 1 tablet (4 mg total) by mouth every 8 (eight) hours as needed for nausea or vomiting. 08/28/24  Yes Gretta Comer POUR, NP  rosuvastatin  (CRESTOR ) 10 MG tablet TAKE 1 TABLET BY MOUTH EVERY DAY FOR CHOLESTEROL 03/21/24  Yes Clark, Katherine K, NP  saxagliptin  HCl (ONGLYZA) 2.5 MG TABS tablet TAKE 1 TABLET (2.5 MG TOTAL) BY MOUTH DAILY. FOR DIABETES. 07/20/24  Yes Clark, Katherine K, NP  sevelamer  carbonate (RENVELA ) 800 MG tablet Take 800 mg by mouth 3 (three) times daily with meals. 08/07/24  Yes [provider]  SYNTHROID  150 MCG tablet TAKE 1 TABLET BY MOUTH DAILY BEFORE BREAKFAST. 03/12/24  Yes Trixie File, MD  albuterol  (VENTOLIN  HFA) 108 (90 Base) MCG/ACT inhaler INHALE 2 PUFFS INTO THE LUNGS UP TO EVERY 6HRS AS NEEDED FOR WHEEZING/SHORTNESS OF BREATH Patient not taking: Reported on 09/07/2024 09/06/23   Gretta Comer POUR, NP  AURYXIA  1 GM 210 MG(Fe) tablet Take 210 mg by mouth 3 (three) times daily with meals. Patient not taking: Reported on 09/07/2024 04/05/23   [provider]  calcitRIOL (ROCALTROL) 0.25 MCG capsule Take 0.25 mcg by mouth 3 (three) times a week. Patient not taking:  Reported on 09/07/2024 08/07/24   [provider]  gabapentin  (NEURONTIN ) 100 MG capsule Take 1 capsule (100 mg total) by mouth 3 (three) times daily. Patient not taking: Reported on 09/07/2024 08/15/24   Lanis Fonda BRAVO, MD  HYDROcodone -acetaminophen  (NORCO/VICODIN) 5-325 MG tablet Take 1 tablet by mouth every 6 (six) hours as needed. Patient not taking: Reported on 08/28/2024 08/04/24   Arlice Reichert, MD  Lancets [facility] ULTRASOFT) lancets Use as instructed to test blood sugar daily 02/15/24   Clark, Katherine K, NP  lidocaine  (LIDODERM ) 5 % PLACE 1 PATCH ONTO THE SKIN DAILY. REMOVE & DISCARD PATCH WITHIN 12 HOURS OR AS DIRECTED BY MD Patient not taking: Reported on 08/28/2024 04/21/24   Clark, Katherine K, NP  losartan  (COZAAR ) 100 MG tablet Take 100 mg by mouth daily. Patient not taking: Reported on 08/28/2024 08/06/24   [provider]  losartan  (COZAAR ) 50 MG tablet Take 50 mg by mouth daily. Patient not taking: Reported on 08/28/2024 08/10/24   [provider]    Physical Exam: Vitals:   09/07/24 2252 09/08/24 0150 09/08/24 0230 09/08/24 0315  BP:  (!) 93/56 97/60   Pulse:  94    Resp:  15 11   Temp: 98.1 F (36.7 C)   98 F (36.7 C)  TempSrc: Oral     SpO2:  97%    Weight:      Height:        Physical Exam Vitals reviewed.  Constitutional:      General: She is not in acute distress. HENT:     Mouth/Throat:     Comments: Oral thrush Eyes:     Extraocular Movements: Extraocular movements intact.  Cardiovascular:     Rate and Rhythm: Normal rate and regular rhythm.     Heart sounds: Normal heart sounds.  Pulmonary:     Effort: Pulmonary effort is normal. No respiratory distress.     Breath sounds: Normal breath sounds.  Abdominal:     General: Bowel sounds are normal. There is no distension.     Palpations: Abdomen is soft.     Tenderness: There is abdominal tenderness. There is no guarding.     Comments: Mild generalized tenderness to palpation  Musculoskeletal:     Cervical back: Normal range of motion.     Right lower leg: No edema.     Left lower leg: No edema.  Skin:    General: Skin is warm and dry.  Neurological:     General: No focal deficit present.     Mental Status: She is alert and oriented to person, place, and time.     Cranial Nerves: No cranial nerve deficit.     Sensory: No sensory deficit.     Motor: No weakness.      Comments: Strength 5 out of 5 in bilateral lower extremities and no sensory deficit     Labs on Admission: I have personally reviewed following labs and imaging studies  CBC: Recent Labs  Lab 09/07/24 1922  WBC 10.5  HGB 12.1  HCT 37.2  MCV 110.7*  PLT 207   Basic Metabolic Panel: Recent Labs  Lab 09/07/24 1922  NA 135  K 3.8  CL 91*  CO2 22  GLUCOSE 95  BUN 37*  CREATININE 15.35*  CALCIUM  8.8*   GFR: Estimated Creatinine Clearance: 4.3 mL/min (A) (by C-G formula based on SCr of 15.35 mg/dL (H)). Liver Function Tests: Recent Labs  Lab 09/07/24 1922  AST 28  ALT 13  ALKPHOS 228*  BILITOT 1.0  PROT 7.3  ALBUMIN  2.3*   Recent Labs  Lab 09/07/24 1922  LIPASE 20   No results for input(s): AMMONIA in the last 168 hours. Coagulation Profile: No results for input(s): INR, PROTIME in the last 168 hours. Cardiac Enzymes: No results for input(s): CKTOTAL, CKMB, CKMBINDEX, TROPONINI in the last 168 hours. BNP (last 3 results) No results for input(s): PROBNP in the last 8760 hours. HbA1C: No results for input(s): HGBA1C in the last 72 hours. CBG: No results for input(s): GLUCAP in the last 168 hours. Lipid Profile: No results for input(s): CHOL, HDL, LDLCALC, TRIG, CHOLHDL, LDLDIRECT in the last 72 hours. Thyroid  Function Tests: No results for input(s): TSH, T4TOTAL, FREET4, T3FREE, THYROIDAB in the last 72 hours. Anemia Panel: No results for input(s): VITAMINB12, FOLATE, FERRITIN, TIBC, IRON , RETICCTPCT in the last 72 hours. Urine analysis:    Component Value Date/Time   COLORURINE YELLOW 07/18/2024 2142   APPEARANCEUR HAZY (A) 07/18/2024 2142   LABSPEC 1.025 07/18/2024 2142   PHURINE 5.0 07/18/2024 2142   GLUCOSEU NEGATIVE 07/18/2024 2142   GLUCOSEU NEG mg/dL 95/91/7990 8581   HGBUR NEGATIVE 07/18/2024 2142   BILIRUBINUR SMALL (A) 07/18/2024 2142   BILIRUBINUR neg 10/19/2022 1340   KETONESUR  NEGATIVE 07/18/2024 2142   PROTEINUR 30 (A) 07/18/2024 2142   UROBILINOGEN 0.2 10/19/2022 1340   UROBILINOGEN 0.2 10/21/2014 2347   NITRITE NEGATIVE 07/18/2024 2142   LEUKOCYTESUR MODERATE (A) 07/18/2024 2142    Radiological Exams on Admission: CT ABDOMEN WO CONTRAST Result Date: 09/07/2024 EXAM: CT ABDOMEN WITHOUT CONTRAST 09/07/2024 10:05:51 PM TECHNIQUE: CT of the abdomen was performed without the administration of intravenous contrast. Multiplanar reformatted images are provided for review. Automated exposure control, iterative reconstruction, and/or weight based adjustment of the mA/kV was utilized to reduce the radiation dose to as low as reasonably achievable. COMPARISON: 07/18/2024 CLINICAL HISTORY: Epigastric pain; Diffuse abd pain, N/V/D. FINDINGS: LOWER CHEST: Visualized portion of the lower chest demonstrates no acute abnormality. HEPATOBILIARY: The visualized liver is unremarkable. 1 cm gallstone layering within the gallbladder. SPLEEN: Spleen demonstrates no acute abnormality. PANCREAS: Pancreas demonstrates no acute abnormality. ADRENAL GLANDS: Diffuse enlargement of the left adrenal gland compatible with hyperplasia, stable. KIDNEYS: No stones in the kidneys or proximal ureters. No hydronephrosis. No perinephric or periureteral stranding. GI AND BOWEL: Stomach and duodenal sweep demonstrate no acute abnormality. There is no bowel obstruction. No abnormal bowel wall thickening or distension. PERITONEUM AND RETROPERITONEUM: Small amount of ascites adjacent to the liver and spleen. Stent graft repair of aortic dissection. Stent graft extends into the upper abdominal aorta. Dissection continues throughout the abdominal aorta, unchanged since prior study . No free air. LYMPH NODES: No lymphadenopathy. BONES AND SOFT TISSUES: No acute abnormality of the visualized bones. No focal soft tissue abnormality. IMPRESSION: 1. Stent graft repair of aortic dissection with continued dissection throughout  the abdominal aorta, stable since prior study . 2. Small amount of ascites adjacent to the liver and spleen. Electronically signed by: Franky Crease MD 09/07/2024 10:19 PM EST RP Workstation: [id number]   DG Chest Portable 1 View Result Date: 09/07/2024 EXAM: 1 VIEW(S) XRAY OF THE CHEST 09/07/2024 08:40:00 PM COMPARISON: None available. CLINICAL HISTORY: SOB FINDINGS: LUNGS AND PLEURA: No vascular congestion, edema, or consolidation. No pleural effusion or pneumothorax. HEART AND MEDIASTINUM: Postoperative changes in the mediastinum. Thoracic aortic stent in place. Mild cardiac enlargement. BONES AND SOFT TISSUES: Surgical clips in the base of the neck. No acute  osseous abnormality. IMPRESSION: 1. No acute cardiopulmonary process. 2. Mild cardiomegaly. Electronically signed by: Elsie Gravely MD 09/07/2024 08:47 PM EST RP Workstation: [id number]    EKG: Independently reviewed. Sinus tachycardia, LAFB, LBBB, QTc 558. No significant change compared to previous EKGs.   Assessment and Plan  ESRD on PD with concern for possible peritonitis Patient is presenting with nausea, vomiting, and generalized abdominal pain/tenderness.  Alk phos 228, transaminases and T. bili normal, lipase normal.  Also reporting low back pain which is chronic.  History of aortic dissection status post endovascular repair. CT abdomen pelvis showing stent graft repair of aortic dissection with continued dissection throughout the abdominal aorta, stable since prior study.  Showing small amount of ascites adjacent to the liver and spleen.  No fever or significant leukocytosis.  No signs of sepsis at this time.  Blood pressure was soft and now improved after 500 mL IV fluids in the ED.  Not tachycardic.  COVID and flu negative.  EDP discussed the case with nephrology and they recommended admission for IV antibiotics and will evaluate the patient in the morning for peritonitis with fluid analysis.  Continue antibiotic coverage with  vancomycin  and Zosyn  at this time.  Trend WBC count.  UA pending.  No longer actively vomiting.  Diarrhea COVID and flu negative.  No evidence of colitis on CT.  C. difficile PCR and GI pathogen panel ordered, enteric precautions.  Dyspnea on exertion Patient is reporting mild dyspnea on exertion for the past few days.  Lungs clear on exam.  Chest x-ray showing no acute cardiopulmonary process.  PE less likely given no chest pain, hypoxemia, or clinical signs of DVT on exam.  Oral thrush Continue oral nystatin  suspension.  Chronic low back pain Continue hydrocodone , Robaxin , and lidocaine  patch.  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  Anxiety, depression Continue BuSpar .  Asthma Stable, no wheezing.  Continue albuterol  PRN.  Type 2 diabetes Hemoglobin A1c 6.1 on 07/31/2024.  Placed on very sensitive sliding scale insulin .  GERD Continue PPI.  HIV CD4 count 445 on 07/31/2024.  Continue [name]  and Descovy .  PVD Hyperlipidemia Continue Crestor .  Hypothyroidism Continue Synthroid .  Gout Continue allopurinol .  DVT prophylaxis: SCDs Code Status: Full Code (discussed with the patient) Level of care: Progressive Care Unit Admission status: It is my clinical opinion that referral for OBSERVATION is reasonable and necessary in this patient based on the above information provided. The aforementioned taken together are felt to place the patient at high risk for further clinical deterioration. However, it is anticipated that the patient may be medically stable for discharge from the hospital within 24 to 48 hours.  Editha Ram MD [facility]  If 7PM-7AM, please contact night-coverage [email or web address]  09/08/2024, 3:18 AM

## 2024-09-08 NOTE — Plan of Care (Signed)

## 2024-09-08 NOTE — Progress Notes (Signed)
 Pharmacy Antibiotic Note  [name] is a 58 y.o. female admitted on 09/07/2024 with V/D.  Pharmacy has been consulted for zosyn /vancomycin  dosing for peritonitis.  -Recent UTI treated with keflex ; currently on minocycline  for  -WBC WNL, ESRD on PD (CCPD-dialysate fluid has been clear), afebrile -CTA: ascites near liver and spleen   Plan: -Zosyn  2.25gm IV every 8 hours  -Vancomycin  2g IV x1 -Obtain VR in 3-4 days and once level less than 20 re-dose  -F/u nephrology consult  -Follow up signs of clinical improvement, LOT, de-escalation of antibiotics   Height: 5' 5 (165.1 cm) Weight: 80.7 kg (177 lb 14.6 oz) IBW/kg (Calculated) : 57  Temp (24hrs), Avg:98 F (36.7 C), Min:97.9 F (36.6 C), Max:98.1 F (36.7 C)  Recent Labs  Lab 09/07/24 1922  WBC 10.5  CREATININE 15.35*    Estimated Creatinine Clearance: 4.3 mL/min (A) (by C-G formula based on SCr of 15.35 mg/dL (H)).    Allergies  Allergen Reactions   Genvoya [Elviteg-Cobic-Emtricit-Tenofaf] Hives   Lisinopril Cough   Aldactone  [Spironolactone ] Hives   Valium  [Diazepam ] Other (See Comments)    Lethargy (pt states she cannot take this again)   Tegaderm Ag Mesh [Silver] Itching    Antimicrobials this admission: Zosyn  11/22 >>  Vancomycin  11/23 >>   Microbiology results:  Thank you for allowing pharmacy to be a part of this patient's care.  Lynwood Poplar, PharmD, BCPS Clinical Pharmacist 09/08/2024 5:14 AM

## 2024-09-09 ENCOUNTER — Ambulatory Visit: Admitting: General Practice

## 2024-09-09 ENCOUNTER — Other Ambulatory Visit (HOSPITAL_COMMUNITY): Payer: Self-pay

## 2024-09-09 ENCOUNTER — Inpatient Hospital Stay (HOSPITAL_COMMUNITY)

## 2024-09-09 ENCOUNTER — Telehealth (HOSPITAL_COMMUNITY): Payer: Self-pay | Admitting: Pharmacy Technician

## 2024-09-09 DIAGNOSIS — N186 End stage renal disease: Secondary | ICD-10-CM | POA: Diagnosis not present

## 2024-09-09 DIAGNOSIS — Z21 Asymptomatic human immunodeficiency virus [HIV] infection status: Secondary | ICD-10-CM | POA: Diagnosis not present

## 2024-09-09 DIAGNOSIS — K659 Peritonitis, unspecified: Secondary | ICD-10-CM | POA: Diagnosis not present

## 2024-09-09 DIAGNOSIS — E1169 Type 2 diabetes mellitus with other specified complication: Secondary | ICD-10-CM

## 2024-09-09 DIAGNOSIS — E785 Hyperlipidemia, unspecified: Secondary | ICD-10-CM

## 2024-09-09 LAB — GASTROINTESTINAL PANEL BY PCR, STOOL (REPLACES STOOL CULTURE)

## 2024-09-09 LAB — GLUCOSE, CAPILLARY
Glucose-Capillary: 93 mg/dL (ref 70–99)
Glucose-Capillary: 67 mg/dL — ABNORMAL LOW (ref 70–99)
Glucose-Capillary: 72 mg/dL (ref 70–99)
Glucose-Capillary: 63 mg/dL — ABNORMAL LOW (ref 70–99)
Glucose-Capillary: 116 mg/dL — ABNORMAL HIGH (ref 70–99)
Glucose-Capillary: 87 mg/dL (ref 70–99)

## 2024-09-09 LAB — COMPREHENSIVE METABOLIC PANEL WITH GFR
ALT: 11 U/L (ref 0–44)
AST: 22 U/L (ref 15–41)
Albumin: 1.9 g/dL — ABNORMAL LOW (ref 3.5–5.0)
Alkaline Phosphatase: 212 U/L — ABNORMAL HIGH (ref 38–126)
Anion gap: 19 — ABNORMAL HIGH (ref 5–15)
BUN: 38 mg/dL — ABNORMAL HIGH (ref 6–20)
CO2: 24 mmol/L (ref 22–32)
Calcium: 8.2 mg/dL — ABNORMAL LOW (ref 8.9–10.3)
Chloride: 91 mmol/L — ABNORMAL LOW (ref 98–111)
Creatinine, Ser: 14.73 mg/dL — ABNORMAL HIGH (ref 0.44–1.00)
GFR, Estimated: 3 mL/min — ABNORMAL LOW (ref >60)
Glucose, Bld: 77 mg/dL (ref 70–99)
Potassium: 3.2 mmol/L — ABNORMAL LOW (ref 3.5–5.1)
Sodium: 134 mmol/L — ABNORMAL LOW (ref 135–145)
Total Bilirubin: 1 mg/dL (ref 0.0–1.2)
Total Protein: 6.5 g/dL (ref 6.5–8.1)

## 2024-09-09 LAB — CBC
HCT: 32.2 % — ABNORMAL LOW (ref 36.0–46.0)
Hemoglobin: 10.9 g/dL — ABNORMAL LOW (ref 12.0–15.0)
MCH: 36.7 pg — ABNORMAL HIGH (ref 26.0–34.0)
MCHC: 33.9 g/dL (ref 30.0–36.0)
MCV: 108.4 fL — ABNORMAL HIGH (ref 80.0–100.0)
Platelets: 173 K/uL (ref 150–400)
RBC: 2.97 MIL/uL — ABNORMAL LOW (ref 3.87–5.11)
RDW: 14.3 % (ref 11.5–15.5)
WBC: 9.4 K/uL (ref 4.0–10.5)
nRBC: 0.7 % — ABNORMAL HIGH (ref 0.0–0.2)

## 2024-09-09 LAB — PHOSPHORUS: Phosphorus: 7 mg/dL — ABNORMAL HIGH (ref 2.5–4.6)

## 2024-09-09 LAB — MAGNESIUM: Magnesium: 1.7 mg/dL (ref 1.7–2.4)

## 2024-09-09 MED ORDER — PANTOPRAZOLE SODIUM 40 MG PO TBEC
40.0000 mg | DELAYED_RELEASE_TABLET | Freq: Two times a day (BID) | ORAL | Status: DC
  Administered 2024-09-09 – 2024-09-12 (×6): 40 mg via ORAL
  Filled 2024-09-09 (×6): qty 1

## 2024-09-09 MED ORDER — HYDROXYZINE HCL 25 MG PO TABS
25.0000 mg | ORAL_TABLET | Freq: Three times a day (TID) | ORAL | Status: DC | PRN
Start: 2024-09-09 — End: 2024-09-12
  Administered 2024-09-09 – 2024-09-11 (×3): 25 mg via ORAL
  Filled 2024-09-09 (×3): qty 1

## 2024-09-09 MED ORDER — POTASSIUM CHLORIDE CRYS ER 20 MEQ PO TBCR
40.0000 meq | EXTENDED_RELEASE_TABLET | Freq: Once | ORAL | Status: AC
  Administered 2024-09-09: 40 meq via ORAL
  Filled 2024-09-09: qty 2

## 2024-09-09 MED ORDER — ONDANSETRON HCL 4 MG/2ML IJ SOLN
4.0000 mg | Freq: Four times a day (QID) | INTRAMUSCULAR | Status: DC | PRN
  Administered 2024-09-09 – 2024-09-10 (×2): 4 mg via INTRAVENOUS
  Filled 2024-09-09 (×2): qty 2

## 2024-09-09 NOTE — Plan of Care (Signed)

## 2024-09-09 NOTE — Progress Notes (Signed)
 [facility] Progress Note   Subjective:   Reports PD went well overnight, no pain. Reports ongoing nausea and diarrhea. Denies SOB, CP, fever, chills, sick contacts. Intermittent HA. PD fluid cell count 61, culture pending.   Objective Vitals:   09/08/24 2009 09/08/24 2318 09/09/24 0353 09/09/24 0828  BP: 104/62 112/70 (!) 108/55 133/62  Pulse: 89 89  91  Resp: 14 16 17 18   Temp: 98.2 F (36.8 C) 97.7 F (36.5 C) 98.1 F (36.7 C) 98.9 F (37.2 C)  TempSrc: Oral Oral Oral Oral  SpO2: 98% 97%  97%  Weight:      Height:       Physical Exam General: Alert female in NAD Heart: RRR, no murmurs Lungs: CTA bilaterally, respirations unlabored Abdomen: Soft, non-distended, +BS Extremities: No edema b/l lower extremities Dialysis Access:  PD cath in R abdomen  Additional Objective Labs: Basic Metabolic Panel: Recent Labs  Lab 09/07/24 1922 09/08/24 0527 09/09/24 0358  NA 135 137 134*  K 3.8 3.7 3.2*  CL 91* 95* 91*  CO2 22 24 24   GLUCOSE 95 68* 77  BUN 37* 40* 38*  CREATININE 15.35* 15.88* 14.73*  CALCIUM  8.8* 8.3* 8.2*  PHOS  --   --  7.0*   Liver Function Tests: Recent Labs  Lab 09/07/24 1922 09/09/24 0358  AST 28 22  ALT 13 11  ALKPHOS 228* 212*  BILITOT 1.0 1.0  PROT 7.3 6.5  ALBUMIN  2.3* 1.9*   Recent Labs  Lab 09/07/24 1922  LIPASE 20   CBC: Recent Labs  Lab 09/07/24 1922 09/08/24 0527 09/09/24 0358  WBC 10.5 9.8 9.4  HGB 12.1 10.5* 10.9*  HCT 37.2 31.0* 32.2*  MCV 110.7* 108.4* 108.4*  PLT 207 177 173   Blood Culture    Component Value Date/Time   SDES FLUID 09/08/2024 1030   SPECREQUEST PERITONEAL 09/08/2024 1030   CULT PENDING 09/08/2024 1030   REPTSTATUS PENDING 09/08/2024 1030    Cardiac Enzymes: No results for input(s): CKTOTAL, CKMB, CKMBINDEX, TROPONINI in the last 168 hours. CBG: Recent Labs  Lab 09/08/24 1606 09/08/24 2032 09/08/24 2323 09/09/24 0351 09/09/24 0828  GLUCAP 78 124* 103* 93 67*    Iron  Studies: No results for input(s): IRON , TIBC, TRANSFERRIN, FERRITIN in the last 72 hours. @lablastinr3 @ Studies/Results: CT ABDOMEN WO CONTRAST Result Date: 09/07/2024 EXAM: CT ABDOMEN WITHOUT CONTRAST 09/07/2024 10:05:51 PM TECHNIQUE: CT of the abdomen was performed without the administration of intravenous contrast. Multiplanar reformatted images are provided for review. Automated exposure control, iterative reconstruction, and/or weight based adjustment of the mA/kV was utilized to reduce the radiation dose to as low as reasonably achievable. COMPARISON: 07/18/2024 CLINICAL HISTORY: Epigastric pain; Diffuse abd pain, N/V/D. FINDINGS: LOWER CHEST: Visualized portion of the lower chest demonstrates no acute abnormality. HEPATOBILIARY: The visualized liver is unremarkable. 1 cm gallstone layering within the gallbladder. SPLEEN: Spleen demonstrates no acute abnormality. PANCREAS: Pancreas demonstrates no acute abnormality. ADRENAL GLANDS: Diffuse enlargement of the left adrenal gland compatible with hyperplasia, stable. KIDNEYS: No stones in the kidneys or proximal ureters. No hydronephrosis. No perinephric or periureteral stranding. GI AND BOWEL: Stomach and duodenal sweep demonstrate no acute abnormality. There is no bowel obstruction. No abnormal bowel wall thickening or distension. PERITONEUM AND RETROPERITONEUM: Small amount of ascites adjacent to the liver and spleen. Stent graft repair of aortic dissection. Stent graft extends into the upper abdominal aorta. Dissection continues throughout the abdominal aorta, unchanged since prior study . No free air. LYMPH NODES: No lymphadenopathy.  BONES AND SOFT TISSUES: No acute abnormality of the visualized bones. No focal soft tissue abnormality. IMPRESSION: 1. Stent graft repair of aortic dissection with continued dissection throughout the abdominal aorta, stable since prior study . 2. Small amount of ascites adjacent to the liver and spleen.  Electronically signed by: Franky Crease MD 09/07/2024 10:19 PM EST RP Workstation: [id number]   DG Chest Portable 1 View Result Date: 09/07/2024 EXAM: 1 VIEW(S) XRAY OF THE CHEST 09/07/2024 08:40:00 PM COMPARISON: None available. CLINICAL HISTORY: SOB FINDINGS: LUNGS AND PLEURA: No vascular congestion, edema, or consolidation. No pleural effusion or pneumothorax. HEART AND MEDIASTINUM: Postoperative changes in the mediastinum. Thoracic aortic stent in place. Mild cardiac enlargement. BONES AND SOFT TISSUES: Surgical clips in the base of the neck. No acute osseous abnormality. IMPRESSION: 1. No acute cardiopulmonary process. 2. Mild cardiomegaly. Electronically signed by: Elsie Gravely MD 09/07/2024 08:47 PM EST RP Workstation: [id number]   Medications:  dialysis solution 1.5% low-MG/low-CA     dialysis solution 1.5% low-MG/low-CA      allopurinol   100 mg Oral Daily   budesonide  (PULMICORT ) nebulizer solution  0.25 mg Nebulization BID   busPIRone   5 mg Oral BID   dolutegravir   50 mg Oral Daily   emtricitabine -tenofovir  AF  1 tablet Oral Daily   gentamicin  cream  1 Application Topical Daily   levothyroxine   150 mcg Oral QAC breakfast   lidocaine   1 patch Transdermal Q24H   loratadine   10 mg Oral QPM   nystatin   5 mL Oral QID   pantoprazole   40 mg Oral BID AC   potassium chloride   40 mEq Oral Once   rosuvastatin   10 mg Oral Daily   sevelamer  carbonate  800 mg Oral TID WC   vancomycin   125 mg Oral QID    Dialysis Orders: CCPD 7x week-[facility] 5 exchanges, fill vol 2250, 1.5hr dwell time 1/2 1.5% bags; 1/2 2.5% bags (usual per pt)    K+ 3.7, BUN 40, creat 15  Ca 8.3      Assessment/Plan: N/V/D/ abd pain:  Initial concern for possible PD cath related peritonitis. Empiric IV abx were started. PD fluid cell count was 61 and she does not have any abdominal pain today, but culture is pending. Reports nausea and diarrhea for several days, presumed c. Diff colitis per PMD. Lower  suspicion for peritonitis.  ESRD: on CCPD nightly. Plan for PD again today.  BP: home bp meds on hold, BP's have not been high lately and are in fact borderline low.  Volume: no vol excess on exam. Follow.  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  Lucie Collet, PA-C 09/09/2024, 9:07 AM  [facility] Pager: [phone]

## 2024-09-09 NOTE — Progress Notes (Signed)
 [name]   [address] [place] [address]   [phone]     Type 2 diabetes mellitus with diabetic chronic kidney disease    (CMD) No data to display  Chronic Start Date:  01/30/2023   Maintenance Dialysis History      Start End Type Center Comments   01/30/2023  Peritoneal [facility]          Current Dialysis Center Information     [facility] [place]     Phone: [phone] Fax:    Address: [address] [place] [address]                 Orders Placed This Encounter  Procedures  . Ambulatory referral to Solid Organ Transplant Team

## 2024-09-09 NOTE — Telephone Encounter (Signed)
 Pharmacy Patient Advocate Encounter   Received notification from Inpatient Request that prior authorization for vancomycin  125 mcg capsules is required/requested.   Insurance verification completed.   The patient is insured through [employer].   Per test claim: PA required; PA submitted to above mentioned insurance via Latent Key/confirmation #/EOC [facility] Status is pending

## 2024-09-09 NOTE — Progress Notes (Signed)
 Pt receives PD care through [facility] home therapy dept. Will assist as needed.   Randine Mungo Dialysis Navigator [phone]

## 2024-09-09 NOTE — Telephone Encounter (Signed)
 Pharmacy Patient Advocate Encounter  Received notification from [employer] that Prior Authorization for vancomycin  125 mg capsules has been APPROVED from 09/09/2024 to 10/16/2025. Ran test claim, Copay is $0.00. This test claim was processed through [facility]- copay amounts may vary at other pharmacies due to pharmacy/plan contracts, or as the patient moves through the different stages of their insurance plan.   PA #/Case ID/Reference #: [id number]

## 2024-09-09 NOTE — Progress Notes (Addendum)
 PROGRESS NOTE        PATIENT DETAILS Name: [name] Age: 58 y.o. Sex: female Date of Birth: [birth year] Admit Date: 09/07/2024 Admitting Physician Editha Ram, MD ERE:Rojmx, Comer POUR, NP  Brief Summary: Patient is a 58 y.o.  female ESRD on PD, HIV, recent history of pituitary apoplexy, HTN, papillary thyroid  carcinoma-s/p thyroidectomy, sarcoidosis-who presented with abdominal pain, nausea, vomiting and diarrhea.  Initially-there was concern for PD catheter associated peritonitis-however workup was not consistent with peritonitis, subsequent stools since studies were obtained-which were suggestive of C. difficile colitis (recent antibiotic exposure apparently 3 weeks back)-and started on oral vancomycin .  See below for further details.  Significant events: 11/23>> admit to [facility].  Significant studies: 11/22>> CXR: No PNA 11/22>> CT abdomen: Stent graft repair of aortic dissection with continued dissection throughout the abdominal aorta-stable since prior study.  Small amount of ascites. 11/23>> peritoneal fluid: WBC 61  Significant microbiology data: 11/22>> COVID/influenza/RSV PCR: Negative 11/23>> GI pathogen panel: Negative 11/23>> stool C. difficile antigen positive, toxin negative. 11/23>> peritoneal fluid culture: No growth  Procedures: None  Consults: Nephrology  Subjective: Still with some vague upper abdominal pain-however overall improved per patient.  Nauseous but no vomiting.  Her diarrhea is significantly better and seems to have essentially resolved-she had only 1 BM yesterday-none overnight/this morning.    Claims she has both solids/liquid food dysphagia x 3 weeks-points to her upper chest/lower neck area to where food gets stuck.  Objective: Vitals: Blood pressure 133/62, pulse 91, temperature 98.9 F (37.2 C), temperature source Oral, resp. rate 18, height 5' 5 (1.651 m), weight 80.7 kg, last menstrual period  03/31/2014, SpO2 97%.   Exam: Gen Exam:Alert awake-not in any distress HEENT:atraumatic, normocephalic Chest: B/L clear to auscultation anteriorly CVS:S1S2 regular Abdomen:soft slightly ender in the epigastric area but no peritoneal signs. Extremities:no edema Neurology: Non focal Skin: no rash  Pertinent Labs/Radiology:    Latest Ref Rng & Units 09/09/2024    3:58 AM 09/08/2024    5:27 AM 09/07/2024    7:22 PM  CBC  WBC 4.0 - 10.5 K/uL 9.4  9.8  10.5   Hemoglobin 12.0 - 15.0 g/dL 89.0  89.4  87.8   Hematocrit 36.0 - 46.0 % 32.2  31.0  37.2   Platelets 150 - 400 K/uL 173  177  207     Lab Results  Component Value Date   NA 134 (L) 09/09/2024   K 3.2 (L) 09/09/2024   CL 91 (L) 09/09/2024   CO2 24 09/09/2024      Assessment/Plan: Presumed C. difficile colitis Although only stool C. difficile antigen and + Antibiotic exposure for UTI approximately 3 weeks back (took for 1 week)-diarrhea has improved after starting oral vancomycin . Continue oral vancomycin  for now Attempt to minimize antibiotic exposure in the future.  Solid/liquid food dysphagia Ongoing x 3 weeks Does have strong history of reflux/GERD-with ongoing symptoms as of this morning-on PPI will switch to twice daily dosing. Barium esophagogram ordered Depending on barium esophagogram results-Will consult GI.  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 Rosslyn Repeat MRI scheduled for December Per chart review-cortisol/LH/FSH/ACTH  levels were all within normal limits Currently no signs of adrenal crisis-continue to monitor closely and have patient follow-up with neurosurgery as planned.  HIV (CD4 445 on 10/15) Continue ART  History  of papillary thyroid  carcinoma-s/p thyroidectomy Synthroid -TSH stable  DM-2 (A1c 6.1) Mild hypoglycemia this a.m. Stop SSI Encourage oral intake Follow/supportive care  ESRD on PD Nephrology  following  Normocytic anemia Secondary to ESRD-defer Aranesp /iron  therapies to nephrology service  Hypokalemia Secondary diarrhea Replete/recheck.  History of aortic dissection-s/p stent graft November 2024 CT abdomen-stable-unchanged from prior Supportive care. Outpatient follow-up with vascular/cardiothoracic surgery.  Gout Allopurinol   Chronic low back pain Unchanged-stable Continue as needed narcotics/Robaxin .  Anxiety/depression Stable BuSpar   Code status:   Code Status: Full Code   DVT Prophylaxis: SCDs Start: 09/08/24 0429   Family Communication: None at bedside   Disposition Plan: Status is: Inpatient Remains inpatient appropriate because: Severity of illness   Planned Discharge Destination:Home   Diet: Diet Order             Diet renal with fluid restriction Fluid restriction: 1200 mL Fluid; Room service appropriate? Yes; Fluid consistency: Thin  Diet effective now                     Antimicrobial agents: Anti-infectives (From admission, onward)    Start     Dose/Rate Route Frequency Ordered Stop   09/08/24 1400  vancomycin  (VANCOCIN ) capsule 125 mg        125 mg Oral 4 times daily 09/08/24 1333 09/18/24 1359   09/08/24 1000  dolutegravir  ([name] ) tablet 50 mg        50 mg Oral Daily 09/08/24 0427     09/08/24 1000  emtricitabine -tenofovir  AF (DESCOVY ) 200-25 MG per tablet 1 tablet        1 tablet Oral Daily 09/08/24 0427     09/08/24 0600  vancomycin  (VANCOREADY) IVPB 2000 mg/400 mL        2,000 mg 200 mL/hr over 120 Minutes Intravenous  Once 09/08/24 0433 09/08/24 0753   09/08/24 0600  piperacillin -tazobactam (ZOSYN ) IVPB 2.25 g  Status:  Discontinued        2.25 g 100 mL/hr over 30 Minutes Intravenous Every 8 hours 09/08/24 0435 09/08/24 1518   09/08/24 0514  vancomycin  variable dose per unstable renal function (pharmacist dosing)         Does not apply See admin instructions 09/08/24 0514     09/07/24 2330   piperacillin -tazobactam (ZOSYN ) IVPB 3.375 g        3.375 g 100 mL/hr over 30 Minutes Intravenous  Once 09/07/24 2323 09/08/24 9662        MEDICATIONS: Scheduled Meds:  allopurinol   100 mg Oral Daily   budesonide  (PULMICORT ) nebulizer solution  0.25 mg Nebulization BID   busPIRone   5 mg Oral BID   dolutegravir   50 mg Oral Daily   emtricitabine -tenofovir  AF  1 tablet Oral Daily   gentamicin  cream  1 Application Topical Daily   insulin  aspart  0-6 Units Subcutaneous Q4H   levothyroxine   150 mcg Oral QAC breakfast   lidocaine   1 patch Transdermal Q24H   loratadine   10 mg Oral QPM   nystatin   5 mL Oral QID   pantoprazole   40 mg Oral Daily   potassium chloride   40 mEq Oral Once   rosuvastatin   10 mg Oral Daily   sevelamer  carbonate  800 mg Oral TID WC   vancomycin   125 mg Oral QID   vancomycin  variable dose per unstable renal function (pharmacist dosing)   Does not apply See admin instructions   Continuous Infusions:  dialysis solution 1.5% low-MG/low-CA     dialysis solution 1.5% low-MG/low-CA  PRN Meds:.albuterol , heparin  sodium (porcine) 3,000 Units in dialysis solution 1.5% low-MG/low-CA 6,000 mL dialysis solution, HYDROcodone -acetaminophen , methocarbamol , naLOXone  (NARCAN )  injection   I have personally reviewed following labs and imaging studies  LABORATORY DATA: CBC: Recent Labs  Lab 09/07/24 1922 09/08/24 0527 09/09/24 0358  WBC 10.5 9.8 9.4  HGB 12.1 10.5* 10.9*  HCT 37.2 31.0* 32.2*  MCV 110.7* 108.4* 108.4*  PLT 207 177 173    Basic Metabolic Panel: Recent Labs  Lab 09/07/24 1922 09/08/24 0527 09/09/24 0358  NA 135 137 134*  K 3.8 3.7 3.2*  CL 91* 95* 91*  CO2 22 24 24   GLUCOSE 95 68* 77  BUN 37* 40* 38*  CREATININE 15.35* 15.88* 14.73*  CALCIUM  8.8* 8.3* 8.2*  MG  --   --  1.7  PHOS  --   --  7.0*    GFR: Estimated Creatinine Clearance: 4.5 mL/min (A) (by C-G formula based on SCr of 14.73 mg/dL (H)).  Liver Function Tests: Recent Labs   Lab 09/07/24 1922 09/09/24 0358  AST 28 22  ALT 13 11  ALKPHOS 228* 212*  BILITOT 1.0 1.0  PROT 7.3 6.5  ALBUMIN  2.3* 1.9*   Recent Labs  Lab 09/07/24 1922  LIPASE 20   No results for input(s): AMMONIA in the last 168 hours.  Coagulation Profile: No results for input(s): INR, PROTIME in the last 168 hours.  Cardiac Enzymes: No results for input(s): CKTOTAL, CKMB, CKMBINDEX, TROPONINI in the last 168 hours.  BNP (last 3 results) No results for input(s): PROBNP in the last 8760 hours.  Lipid Profile: No results for input(s): CHOL, HDL, LDLCALC, TRIG, CHOLHDL, LDLDIRECT in the last 72 hours.  Thyroid  Function Tests: Recent Labs    09/08/24 1039  TSH 0.354    Anemia Panel: No results for input(s): VITAMINB12, FOLATE, FERRITIN, TIBC, IRON , RETICCTPCT in the last 72 hours.  Urine analysis:    Component Value Date/Time   COLORURINE YELLOW 07/18/2024 2142   APPEARANCEUR HAZY (A) 07/18/2024 2142   LABSPEC 1.025 07/18/2024 2142   PHURINE 5.0 07/18/2024 2142   GLUCOSEU NEGATIVE 07/18/2024 2142   GLUCOSEU NEG mg/dL 95/91/7990 8581   HGBUR NEGATIVE 07/18/2024 2142   BILIRUBINUR SMALL (A) 07/18/2024 2142   BILIRUBINUR neg 10/19/2022 1340   KETONESUR NEGATIVE 07/18/2024 2142   PROTEINUR 30 (A) 07/18/2024 2142   UROBILINOGEN 0.2 10/19/2022 1340   UROBILINOGEN 0.2 10/21/2014 2347   NITRITE NEGATIVE 07/18/2024 2142   LEUKOCYTESUR MODERATE (A) 07/18/2024 2142    Sepsis Labs: Lactic Acid, Venous    Component Value Date/Time   LATICACIDVEN 1.2 03/20/2024 1717    MICROBIOLOGY: Recent Results (from the past 240 hours)  Resp panel by RT-PCR (RSV, Flu A&B, Covid) Anterior Nasal Swab     Status: None   Collection Time: 09/07/24  7:57 PM   Specimen: Anterior Nasal Swab  Result Value Ref Range Status   SARS Coronavirus 2 by RT PCR NEGATIVE NEGATIVE Final   Influenza A by PCR NEGATIVE NEGATIVE Final   Influenza B by PCR NEGATIVE  NEGATIVE Final    Comment: (NOTE) The Xpert Xpress SARS-CoV-2/FLU/RSV plus assay is intended as an aid in the diagnosis of influenza from Nasopharyngeal swab specimens and should not be used as a sole basis for treatment. Nasal washings and aspirates are unacceptable for Xpert Xpress SARS-CoV-2/FLU/RSV testing.  Fact Sheet for Patients: bloggercourse.com  Fact Sheet for Healthcare Providers: seriousbroker.it  This test is not yet approved or cleared by the [place]  FDA  and has been authorized for detection and/or diagnosis of SARS-CoV-2 by FDA under an Emergency Use Authorization (EUA). This EUA will remain in effect (meaning this test can be used) for the duration of the COVID-19 declaration under Section 564(b)(1) of the Act, 21 U.S.C. section 360bbb-3(b)(1), unless the authorization is terminated or revoked.     Resp Syncytial Virus by PCR NEGATIVE NEGATIVE Final    Comment: (NOTE) Fact Sheet for Patients: bloggercourse.com  Fact Sheet for Healthcare Providers: seriousbroker.it  This test is not yet approved or cleared by the [place]  FDA and has been authorized for detection and/or diagnosis of SARS-CoV-2 by FDA under an Emergency Use Authorization (EUA). This EUA will remain in effect (meaning this test can be used) for the duration of the COVID-19 declaration under Section 564(b)(1) of the Act, 21 U.S.C. section 360bbb-3(b)(1), unless the authorization is terminated or revoked.  Performed at [facility], [address], [place], [place] [address]   C Difficile Quick Screen w PCR reflex     Status: Abnormal   Collection Time: 09/08/24  4:30 AM   Specimen: STOOL  Result Value Ref Range Status   C Diff antigen POSITIVE (A) NEGATIVE Final   C Diff toxin NEGATIVE NEGATIVE Final   C Diff interpretation Results are indeterminate. See PCR results.  Final     Comment: Performed at [facility], [address], [place], [place] [address]  Gastrointestinal Panel by PCR , Stool     Status: None   Collection Time: 09/08/24  4:30 AM   Specimen: STOOL  Result Value Ref Range Status   Campylobacter species NOT DETECTED NOT DETECTED Final   Plesimonas shigelloides NOT DETECTED NOT DETECTED Final   Salmonella species NOT DETECTED NOT DETECTED Final   Yersinia enterocolitica NOT DETECTED NOT DETECTED Final   Vibrio species NOT DETECTED NOT DETECTED Final   Vibrio cholerae NOT DETECTED NOT DETECTED Final   Enteroaggregative E coli (EAEC) NOT DETECTED NOT DETECTED Final   Enteropathogenic E coli (EPEC) NOT DETECTED NOT DETECTED Final   Enterotoxigenic E coli (ETEC) NOT DETECTED NOT DETECTED Final   Shiga like toxin producing E coli (STEC) NOT DETECTED NOT DETECTED Final   Shigella/Enteroinvasive E coli (EIEC) NOT DETECTED NOT DETECTED Final   Cryptosporidium NOT DETECTED NOT DETECTED Final   Cyclospora cayetanensis NOT DETECTED NOT DETECTED Final   Entamoeba histolytica NOT DETECTED NOT DETECTED Final   Giardia lamblia NOT DETECTED NOT DETECTED Final   Adenovirus F40/41 NOT DETECTED NOT DETECTED Final   Astrovirus NOT DETECTED NOT DETECTED Final   Norovirus GI/GII NOT DETECTED NOT DETECTED Final   Rotavirus A NOT DETECTED NOT DETECTED Final   Sapovirus (I, II, IV, and V) NOT DETECTED NOT DETECTED Final    Comment: Performed at [facility], [address] [place], [place] [address]  C. Diff by PCR, Reflexed     Status: None   Collection Time: 09/08/24  4:30 AM  Result Value Ref Range Status   Toxigenic C. Difficile by PCR NEGATIVE NEGATIVE Final    Comment: Patient is colonized with non toxigenic C. difficile. May not need treatment unless significant symptoms are present.   Hypervirulent Strain PRESUMPTIVE NEGATIVE PRESUMPTIVE NEGATIVE Final    Comment: Performed at [facility], [address], [place], [place] [address]   Body fluid culture w Gram Stain     Status: None (Preliminary result)   Collection Time: 09/08/24 10:30 AM   Specimen: Path fluid; Body  Fluid  Result Value Ref Range Status   Specimen Description FLUID  Final   Special Requests PERITONEAL  Final   Gram Stain   Final    NO WBC SEEN NO ORGANISMS SEEN Performed at [facility], [address], [place], [place] [address]    Culture PENDING  Incomplete   Report Status PENDING  Incomplete    RADIOLOGY STUDIES/RESULTS: CT ABDOMEN WO CONTRAST Result Date: 09/07/2024 EXAM: CT ABDOMEN WITHOUT CONTRAST 09/07/2024 10:05:51 PM TECHNIQUE: CT of the abdomen was performed without the administration of intravenous contrast. Multiplanar reformatted images are provided for review. Automated exposure control, iterative reconstruction, and/or weight based adjustment of the mA/kV was utilized to reduce the radiation dose to as low as reasonably achievable. COMPARISON: 07/18/2024 CLINICAL HISTORY: Epigastric pain; Diffuse abd pain, N/V/D. FINDINGS: LOWER CHEST: Visualized portion of the lower chest demonstrates no acute abnormality. HEPATOBILIARY: The visualized liver is unremarkable. 1 cm gallstone layering within the gallbladder. SPLEEN: Spleen demonstrates no acute abnormality. PANCREAS: Pancreas demonstrates no acute abnormality. ADRENAL GLANDS: Diffuse enlargement of the left adrenal gland compatible with hyperplasia, stable. KIDNEYS: No stones in the kidneys or proximal ureters. No hydronephrosis. No perinephric or periureteral stranding. GI AND BOWEL: Stomach and duodenal sweep demonstrate no acute abnormality. There is no bowel obstruction. No abnormal bowel wall thickening or distension. PERITONEUM AND RETROPERITONEUM: Small amount of ascites adjacent to the liver and spleen. Stent graft repair of aortic dissection. Stent graft extends into the upper abdominal aorta. Dissection continues throughout the abdominal aorta, unchanged since prior study . No  free air. LYMPH NODES: No lymphadenopathy. BONES AND SOFT TISSUES: No acute abnormality of the visualized bones. No focal soft tissue abnormality. IMPRESSION: 1. Stent graft repair of aortic dissection with continued dissection throughout the abdominal aorta, stable since prior study . 2. Small amount of ascites adjacent to the liver and spleen. Electronically signed by: Franky Crease MD 09/07/2024 10:19 PM EST RP Workstation: [id number]   DG Chest Portable 1 View Result Date: 09/07/2024 EXAM: 1 VIEW(S) XRAY OF THE CHEST 09/07/2024 08:40:00 PM COMPARISON: None available. CLINICAL HISTORY: SOB FINDINGS: LUNGS AND PLEURA: No vascular congestion, edema, or consolidation. No pleural effusion or pneumothorax. HEART AND MEDIASTINUM: Postoperative changes in the mediastinum. Thoracic aortic stent in place. Mild cardiac enlargement. BONES AND SOFT TISSUES: Surgical clips in the base of the neck. No acute osseous abnormality. IMPRESSION: 1. No acute cardiopulmonary process. 2. Mild cardiomegaly. Electronically signed by: Elsie Gravely MD 09/07/2024 08:47 PM EST RP Workstation: [id number]     LOS: 1 day   Donalda Applebaum, MD  [facility]    To contact the attending provider between 7A-7P or the covering provider during after hours 7P-7A, please log into the web site [email or web address] and access using universal Morgan password for that web site. If you do not have the password, please call the hospital operator.  09/09/2024, 8:41 AM

## 2024-09-10 DIAGNOSIS — R634 Abnormal weight loss: Secondary | ICD-10-CM

## 2024-09-10 DIAGNOSIS — Z21 Asymptomatic human immunodeficiency virus [HIV] infection status: Secondary | ICD-10-CM | POA: Diagnosis not present

## 2024-09-10 DIAGNOSIS — R1310 Dysphagia, unspecified: Secondary | ICD-10-CM

## 2024-09-10 DIAGNOSIS — R1013 Epigastric pain: Secondary | ICD-10-CM

## 2024-09-10 DIAGNOSIS — G8929 Other chronic pain: Secondary | ICD-10-CM

## 2024-09-10 DIAGNOSIS — R933 Abnormal findings on diagnostic imaging of other parts of digestive tract: Secondary | ICD-10-CM

## 2024-09-10 DIAGNOSIS — N186 End stage renal disease: Secondary | ICD-10-CM | POA: Diagnosis not present

## 2024-09-10 DIAGNOSIS — E1169 Type 2 diabetes mellitus with other specified complication: Secondary | ICD-10-CM | POA: Diagnosis not present

## 2024-09-10 DIAGNOSIS — K659 Peritonitis, unspecified: Secondary | ICD-10-CM | POA: Diagnosis not present

## 2024-09-10 LAB — RENAL FUNCTION PANEL
Albumin: 1.9 g/dL — ABNORMAL LOW (ref 3.5–5.0)
Anion gap: 19 — ABNORMAL HIGH (ref 5–15)
BUN: 39 mg/dL — ABNORMAL HIGH (ref 6–20)
CO2: 25 mmol/L (ref 22–32)
Calcium: 8.4 mg/dL — ABNORMAL LOW (ref 8.9–10.3)
Chloride: 92 mmol/L — ABNORMAL LOW (ref 98–111)
Creatinine, Ser: 14.7 mg/dL — ABNORMAL HIGH (ref 0.44–1.00)
GFR, Estimated: 3 mL/min — ABNORMAL LOW (ref >60)
Glucose, Bld: 148 mg/dL — ABNORMAL HIGH (ref 70–99)
Phosphorus: 6.2 mg/dL — ABNORMAL HIGH (ref 2.5–4.6)
Potassium: 3.5 mmol/L (ref 3.5–5.1)
Sodium: 136 mmol/L (ref 135–145)

## 2024-09-10 LAB — GLUCOSE, CAPILLARY
Glucose-Capillary: 100 mg/dL — ABNORMAL HIGH (ref 70–99)
Glucose-Capillary: 109 mg/dL — ABNORMAL HIGH (ref 70–99)
Glucose-Capillary: 112 mg/dL — ABNORMAL HIGH (ref 70–99)
Glucose-Capillary: 115 mg/dL — ABNORMAL HIGH (ref 70–99)
Glucose-Capillary: 130 mg/dL — ABNORMAL HIGH (ref 70–99)
Glucose-Capillary: 69 mg/dL — ABNORMAL LOW (ref 70–99)
Glucose-Capillary: 76 mg/dL (ref 70–99)

## 2024-09-10 LAB — PATHOLOGIST SMEAR REVIEW

## 2024-09-10 MED ORDER — OXYCODONE HCL 5 MG PO TABS
2.5000 mg | ORAL_TABLET | Freq: Once | ORAL | Status: DC
Start: 2024-09-10 — End: 2024-09-10

## 2024-09-10 MED ORDER — DELFLEX-LC/1.5% DEXTROSE 344 MOSM/L IP SOLN: INTRAPERITONEAL | Status: DC

## 2024-09-10 MED ORDER — OXYCODONE HCL 5 MG PO TABS
5.0000 mg | ORAL_TABLET | Freq: Once | ORAL | Status: AC
  Administered 2024-09-10: 5 mg via ORAL
  Filled 2024-09-10: qty 1

## 2024-09-10 NOTE — TOC Initial Note (Signed)
 Transition of Care [facility]) - Initial/Assessment Note    Patient Details  Name: [name] MRN: [id number] Date of Birth: [birth year]  [facility] CM/SW Contact:    Landry DELENA Senters, RN Phone Number: 09/10/2024, 4:06 PM  Clinical Narrative:                 RR:[id number] history significant of ESRD on PD, anemia, anxiety, asthma, type 2 diabetes, history of aortic dissection status post endovascular repair, prior DVT no longer on anticoagulation, GERD, HIV, hypertension, hyperlipidemia, metastatic papillary thyroid  carcinoma status post thyroidectomy, PVD, sarcoidosis, pancreatitis, gout, chronic back pain, chronic macrocytic anemia, hidradenitis suppurativa, depression, and other medical comorbidities.  She was admitted to the hospital last month 10/14-10/19 for severe headache, nausea, vomiting, blurry vision, photophobia, and trouble walking.  Brain MRI showed an expansive sellar/suprasellar mass favored to reflect a pituitary macroadenoma with concern for associated hemorrhage suggesting pituitary apoplexy   Patient lives at home alone, does report her sister can provide support at times and will likely be the one to take her home at d/c. Patient has PCP, does still drive, manages own medications.   Patient was receiving services with [facility] when admitted to hospital. CM will contact [facility] if services are to resume at d/c.   CM will continue to follow.   Expected Discharge Plan: Home w Home Health Services Barriers to Discharge: Continued Medical Work up   Patient Goals and CMS Choice   CMS Medicare.gov Compare Post Acute Care list provided to:: Patient Choice offered to / list presented to : Patient      Expected Discharge Plan and Services       Living arrangements for the past 2 months: Single Family Home                                      Prior Living Arrangements/Services Living arrangements for the past 2 months: Single Family Home Lives  with:: Self Patient language and need for interpreter reviewed:: Yes Do you feel safe going back to the place where you live?: Yes      Need for Family Participation in Patient Care: No (Comment) Care giver support system in place?: Yes (comment) Current home services: DME (rolling walker) Criminal Activity/Legal Involvement Pertinent to Current Situation/Hospitalization: No - Comment as needed  Activities of Daily Living   ADL Screening (condition at time of admission) Independently performs ADLs?: Yes (appropriate for developmental age) Is the patient deaf or have difficulty hearing?: No Does the patient have difficulty seeing, even when wearing glasses/contacts?: No Does the patient have difficulty concentrating, remembering, or making decisions?: No  Permission Sought/Granted                  Emotional Assessment Appearance:: Developmentally appropriate Attitude/Demeanor/Rapport: Engaged Affect (typically observed): Calm Orientation: : Oriented to Self, Oriented to Place, Oriented to  Time, Oriented to Situation Alcohol  / Substance Use: Not Applicable Psych Involvement: No (comment)  Admission diagnosis:  Peritonitis (HCC) [K65.9] Patient Active Problem List   Diagnosis Date Noted   Abnormal esophagram 09/10/2024   Abdominal pain, chronic, epigastric 09/10/2024   Peritonitis (HCC) 09/08/2024   Diarrhea 09/08/2024   Migraine 07/31/2024   Leukocytosis 07/31/2024   Benign neoplasm of pituitary gland (HCC) 07/30/2024   Hallucinations 06/03/2024   Acute left-sided low back pain 03/29/2024   Localized skin mass, lump, or swelling 03/29/2024   Vaginal  itching 01/18/2024   H/O aortic aneurysm repair 08/25/2023   Thoracic back pain 03/07/2023   At risk for sleep apnea 03/07/2023   Hypothyroidism 02/13/2023   History of aortic dissection 01/29/2023   Chronic pain of both knees 07/29/2022   Chronic illness 10/27/2021   Paresthesias 06/30/2021   Chronic pain of right  hand 06/30/2021   Chronic low back pain 06/30/2021   Voice hoarseness 12/15/2020   Postoperative breast asymmetry 02/28/2020   Chronic gout 01/27/2020   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   Esophageal dysphagia 07/23/2019   ESRD (end stage renal disease) (HCC) 07/12/2019   Normocytic anemia 07/12/2019   Preventative health care 09/10/2018   Port-A-Cath in place 07/12/2018   Family history of breast cancer    Family history of prostate cancer    Family history of lung cancer    Malignant neoplasm of lower-inner quadrant of right breast of female, estrogen receptor positive (HCC) 05/10/2018   Hyperlipidemia 04/26/2017   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   GAD (generalized anxiety disorder) 04/11/2017   Laryngopharyngeal reflux (LPR) 11/24/2016   Obesity (BMI 30-39.9) 10/14/2016   Allergic rhinitis 02/18/2016   Type 2 diabetes mellitus with hyperlipidemia (HCC) 06/23/2015   Hidradenitis suppurativa 01/30/2012   Sarcoidosis 02/08/2007   Cigarette smoker 02/08/2007   HIV (human immunodeficiency virus infection) (HCC) 07/24/2006   Depression 07/24/2006   Hypertension 07/24/2006   Asthma 07/24/2006   PCP:  Gretta Comer POUR, NP Pharmacy:   [id number] [place], [place] - [address] [place] [address] Phone: [phone] Fax: [phone]  [id number] - [place], [place] - [address] AT [facility] [address] [place] [address] Phone: [phone] Fax: [phone]  [name] [address] [place] [address] Phone: [phone] Fax: [phone]     Social Drivers of Health (SDOH) Social History: SDOH Screenings   Food Insecurity: No Food Insecurity (09/08/2024)  Recent Concern: Food Insecurity - Food Insecurity Present (06/27/2024)  Housing: Low Risk  (09/09/2024)  Recent Concern: Housing - High Risk  (06/27/2024)  Transportation Needs: No Transportation Needs (09/08/2024)  Utilities: Not At Risk (09/08/2024)  Recent Concern: Utilities - At Risk (06/27/2024)  Alcohol  Screen: Low Risk  (06/27/2024)  Depression (PHQ2-9): Low Risk  (08/28/2024)  Recent Concern: Depression (PHQ2-9) - Medium Risk (06/05/2024)  Financial Resource Strain: Medium Risk (06/27/2024)  Physical Activity: Inactive (06/27/2024)  Social Connections: Unknown (06/27/2024)  Stress: Stress Concern Present (06/27/2024)  Tobacco Use: Medium Risk (09/07/2024)  Health Literacy: Adequate Health Literacy (06/27/2024)   SDOH Interventions:     Readmission Risk Interventions    02/21/2023    4:05 PM  Readmission Risk Prevention Plan  Transportation Screening Complete  Medication Review (RN Care Manager) Complete  PCP or Specialist appointment within 3-5 days of discharge Complete  HRI or Home Care Consult Complete  SW Recovery Care/Counseling Consult Complete  Palliative Care Screening Not Applicable  Skilled Nursing Facility Not Applicable

## 2024-09-10 NOTE — Consult Note (Addendum)
 Consultation Note   Referring Provider:   [facility] PCP: Gretta Comer POUR, NP Primary Gastroenterologist:   Kavitha Nandigam, MD     Reason for Consultation: Dysphagia, abnormal barium swallow DOA: 09/07/2024         Hospital Day: 4   ASSESSMENT    58 year old female with multiple comorbidities admitted with nausea, vomiting, poor appetite, diarrhea.  Treated empirically for C. difficile in setting of recent antibiotic exposure and positive C. difficile antigen .  Diarrhea has resolved .  Nausea and vomiting improved .  Still having intermittent upper abdominal discomfort of unclear etiology Noncontrast CT scan on admission without acute findings.  Workup on admission negative for PD catheter related peritonitis.   Solid food / liquid dysphagia  Esophageal dysmotility Abnormal barium swallow Weight loss Symptoms possibly due to esophageal dysmotility.  Stricture also possible especially given history of RAI.  No prior history of EGD . She gives a 6 month history of dysphagia to solids and liquids. However we saw her in 2024 for similar symptoms so symptoms seem to be more chronic. Barium swallow demonstrating esophageal dysmotility and delayed passage of barium tablet in mid esophagus and then retaining of tablet at GE junction despite applesauce. . .    History of Graves disease History of thyroid  cancer and is status post RAI and thyroidectomy  End-stage renal disease on CCPD  Chronic anemia, likely anemia of chronic disease Hemoglobin stable at 10.9.  No overt GI blood loss She is up-to-date on screening colonoscopy  Type 2 diabetes.  Cholelithiasis No evidence for acute cholecystitis on noncontrast CT scan  Chronically elevated alkaline phosphatase < 2x ULN. Related to renal disease?   HIV On treatment, followed outpatient by ID  History of aortic dissection status post endovascular repair  See PMH for any  additional medical history  / medical problems  Principal Problem:   Peritonitis (HCC) Active Problems:   HIV (human immunodeficiency virus infection) (HCC)   Type 2 diabetes mellitus with hyperlipidemia (HCC)   ESRD (end stage renal disease) (HCC)   Diarrhea    PLAN:   Will likely need upper endoscopy this admission for evaluation of dysphagia, abnormal barium swallow.  We can also look for ulcers , gastritis, etc as causes of her ongoing upper abdominal pain at the time. Time of procedure to be announced.   HPI   Brief History:   Sukhman is a 58 year old female with history of  thyroid  cancer and is status post RAI and thyroidectomy She was seen in our office March 2024 with complaints of dysphagia.  Our plan was for an esophagram, appears that was not done and we have not seen her in the office since.   Interval history  Patient was admitted several days ago with nausea and vomiting, abdominal pain. Going days without PO intake. Also having nonbloody diarrhea. There was concern for PD catheter associated peritonitis but workup negative for peritonitis.  Stool studies positive for C. difficile antigen , toxin negative , and PCR negative but due to recent exposure to antibiotics she was treated empirically with oral vancomycin  .  Diarrhea has resolved.  No longer having nausea and vomiting.  She does continue to have generalized upper abdominal pain of  unclear etiology and her appetite is poor though she has been having dysphagia to both solids and liquids over the last 6 months.  She reports significant weight loss due to dysphagia.  She has to be careful with everything she eats or will get stuck in her esophagus.  Once food gets stuck then even liquids are hard to get down.  Esophagram this admission showing esophageal motility and slow passage of barium to the mid esophagus (see below).  Following that, tablet held up to GE junction despite giving applesauce. She has no odynophagia.   Patient has no GERD symptoms.  Outside provider recently put her on reflux medication for dysphagia symptoms but she said it has not helped.  No other GI complaints                                                         Pertinent GI Studies   January 2020 screening colonoscopy -- Diverticulosis in the sigmoid colon.  Exam otherwise normal.  No polyps   Labs and Imaging:  Recent Labs    09/07/24 1922 09/09/24 0358 09/10/24 0246  PROT 7.3 6.5  --   ALBUMIN  2.3* 1.9* 1.9*  AST 28 22  --   ALT 13 11  --   ALKPHOS 228* 212*  --   BILITOT 1.0 1.0  --    Recent Labs    09/07/24 1922 09/08/24 0527 09/09/24 0358  WBC 10.5 9.8 9.4  HGB 12.1 10.5* 10.9*  HCT 37.2 31.0* 32.2*  MCV 110.7* 108.4* 108.4*  PLT 207 177 173   Recent Labs    09/08/24 0527 09/09/24 0358 09/10/24 0246  NA 137 134* 136  K 3.7 3.2* 3.5  CL 95* 91* 92*  CO2 24 24 25   GLUCOSE 68* 77 148*  BUN 40* 38* 39*  CREATININE 15.88* 14.73* 14.70*  CALCIUM  8.3* 8.2* 8.4*     DG ESOPHAGUS W SINGLE CM (SOL OR THIN BA) CLINICAL DATA:  Inpatient with history of ESRD on PD, DM 2, aortic dissection status post endovascular repair, prior DVT not on anticoagulation anymore, HTN, HIV, HLD, 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 She reported dysphagia and globus sensation during meals leading to request for esophagram.  EXAM: ESOPHAGUS/BARIUM SWALLOW/TABLET STUDY  TECHNIQUE: Single contrast examination was performed using thin liquid barium. This exam was performed by Brittany Huneycutt, NP, and was supervised and interpreted by Dr. Rockey Kilts.  FLUOROSCOPY: Radiation Exposure Index (as provided by the fluoroscopic device): 38.3 mGy Kerma  COMPARISON:  None Available.  FINDINGS: Swallowing: Appears normal. No vestibular penetration or aspiration seen.  Pharynx: Unremarkable.  Esophagus: Normal appearance.  Esophageal motility: Mild  esophageal dysmotility seen as tertiary contractions within the mid esophagus, best seen in series #2.  Hiatal Hernia: Absent  Gastroesophageal reflux: None visualized.  Ingested 13 mm barium tablet: The tablet was delayed within the mid esophagus. This passed to the distal esophagus with subsequent sips and remained at the GE junction despite providing additional fluids and applesauce  Other: Radiopaque bodies consistent with above mentioned surgical history.  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 Mild delayed passage of a 13 mm barium tablet, likely secondary to dysmotility.  Electronically Signed   By: Rockey Kilts M.D.   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  Genes Analyzed (43 total): APC*, ATM*, AXIN2, BARD1, BMPR1A,  BRCA1*, BRCA2*, BRIP1*, CDH1*, CDK4, CDKN2A, CHEK2*, DICER1, GALNT12, HOXB13, MEN1, MLH1*, MRE11A, MSH2*, MSH3, MSH6*, MUTYH*, NBN, NF1*, NTHL1, PALB2*, PMS2*, POLD1, POLE, PTEN*, RAD50, RAD51C*, RAD51D*, RET, SDHB, SDHD, SMAD4, SMARCA4, STK11 and TP53* (sequencing and deletion/duplication); EGFR (s   GERD 07/24/2006   GRAVE'S DISEASE 01/01/2008   History of hidradenitis suppurativa    History of kidney stones    History of thrush    HIV DISEASE 07/24/2006   dx March 05   Hyperlipidemia    HYPERTENSION 07/24/2006   Hyperthyroidism 08/2006   Grave's Disease -diffuse radiotracer uptake 08/25/06 Thyroid  scan-Cold nodule to R lower lobe of thyrorid   Ileus (HCC) 02/14/2023   Left bundle branch block (LBBB)    Malnutrition of moderate degree 02/08/2023   Menometrorrhagia    hx of   Nephrolithiasis    Panniculitis 05/12/2020   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 Completed 2 yrs Prednisone  after lung bx confirmation. No symptoms since then.   Sebaceous cyst 04/21/2020   Suppurative hidradenitis    Thyroid  cancer (HCC)    THYROID  NODULE, RIGHT 02/08/2007    Past Surgical History:  Procedure Laterality Date   AORTIC ARCH DEBRANCHING N/A 08/25/2023   Procedure: AORTIC ARCH DEBRANCHING USING 12X8X8MM HEMASHIELD GRAFT;  Surgeon: Lucas Dorise POUR, MD;  Location: [facility];  Service: Open Heart Surgery;  Laterality: N/A;   with bypasses to the innominate and left common carotid arteries   APPLICATION OF WOUND VAC N/A 01/20/2021   Procedure: APPLICATION OF WOUND VAC;  Surgeon: Lowery Estefana RAMAN, DO;  Location: [facility];  Service: Plastics;  Laterality: N/A;   BREAST EXCISIONAL  BIOPSY Right 04/26/2018   right axilla negative   BREAST EXCISIONAL BIOPSY Left 04/26/2018   left axilla negative   BREAST LUMPECTOMY Right 10/03/2018   malignant   BREAST LUMPECTOMY WITH RADIOACTIVE SEED AND SENTINEL LYMPH NODE BIOPSY Right 10/03/2018   Procedure: RIGHT BREAST LUMPECTOMY WITH RADIOACTIVE SEED AND SENTINEL LYMPH NODE MAPPING;  Surgeon: Vanderbilt Ned, MD;  Location: [facility];  Service: General;  Laterality: Right;   BREAST SURGERY  1997   Breast Reduction    CAPD INSERTION N/A 12/21/2023   Procedure: LAPAROSCOPIC INSERTION CONTINUOUS AMBULATORY PERITONEAL DIALYSIS  (CAPD) CATHETER; LAPAROSCOPIC OMENTUMPEXY;  Surgeon: Magda Ned SAILOR, MD;  Location: [facility];  Service: Vascular;  Laterality: N/A;   CYSTOSCOPY W/ URETERAL STENT REMOVAL  11/09/2012   Procedure: CYSTOSCOPY WITH STENT REMOVAL;  Surgeon: Ricardo Likens, MD;  Location: [facility];  Service: Urology;  Laterality: Right;   CYSTOSCOPY WITH RETROGRADE PYELOGRAM, URETEROSCOPY AND STENT PLACEMENT  11/09/2012   Procedure: CYSTOSCOPY WITH RETROGRADE PYELOGRAM, URETEROSCOPY AND STENT PLACEMENT;  Surgeon: Ricardo Likens, MD;  Location: [facility];  Service: Urology;  Laterality: Left;  LEFT URETEROSCOPY, STONE MANIPULATION, left STENT exchange    CYSTOSCOPY WITH STENT PLACEMENT  10/02/2012   Procedure: CYSTOSCOPY WITH STENT PLACEMENT;  Surgeon: Ricardo Likens, MD;  Location: [facility];  Service: Urology;  Laterality: Left;   DEBRIDEMENT AND CLOSURE WOUND N/A 01/20/2021   Procedure: Excision of abdominal wound with closure;  Surgeon: Lowery Estefana RAMAN, DO;  Location: [facility];  Service: Plastics;  Laterality: N/A;   DILATION AND CURETTAGE OF UTERUS   11/2002   s/p for 1st trimester nonviable pregnancy   EYE SURGERY     sty under eyelid   INCISE AND DRAIN ABCESS  08/2002   s/p I &D for righ inframmary fold hidradenitis   INCISION AND DRAINAGE PERITONSILLAR ABSCESS  12/2001   IR CV LINE INJECTION  06/07/2018   IR FLUORO GUIDE CV LINE RIGHT  01/30/2023   IR IMAGING GUIDED PORT INSERTION  06/20/2018   IR REMOVAL TUN ACCESS W/ PORT W/O FL MOD SED  06/20/2018   IR US  GUIDE VASC ACCESS RIGHT  01/30/2023   IRRIGATION AND DEBRIDEMENT ABSCESS  01/31/2012   Procedure: IRRIGATION AND DEBRIDEMENT ABSCESS;  Surgeon: Alm VEAR Angle, MD;  Location: [facility];  Service: General;  Laterality: Right;  right breast and axilla    LAPAROSCOPIC REPOSITIONING CAPD CATHETER Left 01/10/2024   Procedure: Removal of peritoneal dialysis Catheter.;  Surgeon: Magda Ned SAILOR, MD;  Location: [facility];  Service: Vascular;  Laterality: Left;   LAPAROSCOPY N/A 01/10/2024   Procedure: LAPAROSCOPY, DIAGNOSTIC;  Surgeon: Magda Ned SAILOR, MD;  Location: [facility] OR;  Service: Vascular;  Laterality: N/A;   NEPHROLITHOTOMY  10/02/2012   Procedure: NEPHROLITHOTOMY PERCUTANEOUS;  Surgeon: Ricardo Likens, MD;  [facility]: [facility];  Service: Urology;  Laterality: Right;  First Stage Percutaneous Nephrolithotomy with Surgeon Access, Left Ureteral Stent     NEPHROLITHOTOMY  10/04/2012   Procedure: NEPHROLITHOTOMY PERCUTANEOUS SECOND LOOK;  Surgeon: Ricardo Likens, MD;  [facility]: [facility];  Service: Urology;  Laterality: Right;      NEPHROLITHOTOMY  10/08/2012   Procedure: NEPHROLITHOTOMY PERCUTANEOUS;  Surgeon: Ricardo Likens, MD;  Location: [facility];  Service: Urology;  Laterality: Right;  THIRD STAGE, nephrostomy tube exchange x 2   NEPHROLITHOTOMY  10/11/2012   Procedure: NEPHROLITHOTOMY PERCUTANEOUS SECOND LOOK;  Surgeon: Ricardo Likens, MD;  [facility]: [facility];  Service: Urology;  Laterality: Right;  RIGHT 4 STAGE PERCUTANOUS NEPHROLITHOTOMY, right URETEROSCOPY  WITH HOLMIUM LASER    PANNICULECTOMY  N/A 12/21/2020   Procedure: PANNICULECTOMY;  Surgeon: Lowery Estefana RAMAN, DO;  Location: [facility];  Service: Plastics;  Laterality: N/A;   PERCUTANEOUS NEPHROSTOLITHOTOMY  04/2022   PORT-A-CATH REMOVAL N/A 07/16/2020   Procedure: REMOVAL PORT-A-CATH;  Surgeon: Vanderbilt Ned, MD;  Location: [facility];  Service: General;  Laterality: N/A;   PORTACATH PLACEMENT Left 05/17/2018   Procedure: INSERTION PORT-A-CATH;  Surgeon: Vernetta Berg, MD;  Location: [facility];  Service: General;  Laterality: Left;   RADICAL NECK DISSECTION  04/12/2017   limited/notes 04/12/2017   RADICAL NECK DISSECTION N/A 04/12/2017   Procedure: RADICAL NECK DISSECTION;  Surgeon: Carlie Clark, MD;  Location: [facility] OR;  Service: ENT;  Laterality: N/A;  limited neck dissection 2 hours total   REDUCTION MAMMAPLASTY Bilateral 1998   RIGHT/LEFT HEART CATH AND CORONARY ANGIOGRAPHY N/A 03/12/2020   Procedure: RIGHT/LEFT HEART CATH AND CORONARY ANGIOGRAPHY;  Surgeon: Cherrie Toribio SAUNDERS, MD;  [facility];  Service: Cardiovascular;  Laterality: N/A;   [place]  1994   TEE WITHOUT CARDIOVERSION N/A 08/25/2023   Procedure: TRANSESOPHAGEAL ECHOCARDIOGRAM;  Surgeon: Lucas Dorise POUR, MD;  Location: [facility];  Service: Open Heart Surgery;  Laterality: N/A;   TENCKHOFF CATHETER INSERTION Left 01/10/2024   Procedure: INSERTION, CATHETER, DIALYSIS, PERITONEAL;  Surgeon: Magda Ned SAILOR, MD;  Location: [facility];  Service: Vascular;  Laterality: Left;   THORACIC AORTIC ENDOVASCULAR STENT GRAFT N/A 02/02/2023   Procedure: THORACIC AORTIC ENDOVASCULAR STENT GRAFT;  Surgeon: Lanis Fonda BRAVO, MD;  Location: [facility] OR;  Service: Vascular;  Laterality: N/A;   THORACIC AORTIC ENDOVASCULAR STENT GRAFT N/A 08/25/2023   Procedure: THORACIC AORTIC ENDOVASCULAR STENT GRAFT;  Surgeon: Lanis Fonda BRAVO, MD;  Location: [facility] OR;  Service: Vascular;  Laterality: N/A;   THYROIDECTOMY  04/12/2017   completion/notes 04/12/2017    THYROIDECTOMY N/A 04/12/2017   Procedure: THYROIDECTOMY;  Surgeon: Carlie Clark, MD;  Location: [facility] OR;  Service: ENT;  Laterality: N/A;  Completion Thyroidectomy   TOTAL THYROIDECTOMY  2010   ULTRASOUND GUIDANCE FOR VASCULAR ACCESS Right 08/25/2023   Procedure: ULTRASOUND GUIDANCE FOR VASCULAR ACCESS, RIGHT FEMORAL ARTERY;  Surgeon: Lanis Fonda BRAVO, MD;  Location: [facility];  Service: Vascular;  Laterality: Right;    Family History  Problem Relation Age of Onset   Hypertension Mother    Cancer Mother        laryngeal   Heart disease Mother        stent   Pancreatic cancer Father    Hypertension Father    Lung cancer Father 35       hx smoking   Hypertension Sister    Hypertension Sister    Hypertension Brother    Breast cancer Maternal Aunt 32   Breast cancer Maternal Aunt        dx 60+   Cancer Maternal Uncle        Lung CA   Breast cancer Paternal Aunt 77   Breast cancer Paternal Aunt        dx 29's   Breast cancer Paternal Aunt        dx 50's   Prostate cancer Paternal Uncle    Prostate cancer Paternal Uncle    Lung cancer Paternal Uncle    Breast cancer Cousin 61   Breast cancer Cousin        dx <50   Breast cancer Cousin        dx <50  Breast cancer Cousin        dx <50   Heart disease Other    Hypertension Other    Stroke Other        Grandparent   Kidney disease Other        Grandparent   Diabetes Other        FH of Diabetes   Colon cancer Neg Hx    Esophageal cancer Neg Hx    Rectal cancer Neg Hx    Stomach cancer Neg Hx     Prior to Admission medications   Medication Sig Start Date End Date Taking? Authorizing Provider  acetaminophen  (TYLENOL ) 500 MG tablet Take 1,000 mg by mouth every 6 (six) hours as needed for mild pain.   Yes [provider]  allopurinol  (ZYLOPRIM ) 100 MG tablet TAKE 1 TABLET (100 MG TOTAL) BY MOUTH DAILY. FOR GOUT PREVENTION 04/11/24  Yes Clark, Katherine K, NP  busPIRone  (BUSPAR ) 5 MG tablet TAKE 1 TABLET BY MOUTH TWICE  A DAY FOR ANXIETY 08/26/24  Yes Clark, Katherine K, NP  dolutegravir  ([name] ) 50 MG tablet Take 1 tablet (50 mg total) by mouth daily. 05/13/24  Yes Melvenia Corean SAILOR, NP  emtricitabine -tenofovir  AF (DESCOVY ) 200-25 MG tablet Take 1 tablet by mouth daily. 05/13/24  Yes Melvenia Corean SAILOR, NP  fluticasone  (FLOVENT  HFA) 110 MCG/ACT inhaler Inhale 1 puff into the lungs 2 (two) times daily.   Yes [provider]  levocetirizine (XYZAL ) 5 MG tablet TAKE 1 TABLET BY MOUTH EVERY DAY IN THE EVENING FOR ALLERGIES 05/12/24  Yes Clark, Katherine K, NP  methocarbamol  (ROBAXIN ) 500 MG tablet Take 1 tablet (500 mg total) by mouth every 6 (six) hours as needed for muscle spasms. 08/30/24  Yes Janjua, Rashid M, MD  minocycline  (MINOCIN ) 100 MG capsule Take 1 capsule (100 mg total) by mouth 2 (two) times daily. 03/13/24  Yes Melvenia Corean SAILOR, NP  nystatin  (MYCOSTATIN ) 100000 UNIT/ML suspension Take 5 mLs by mouth 4 (four) times daily. 09/05/24  Yes [provider]  omeprazole  (PRILOSEC) 40 MG capsule Take 1 capsule (40 mg total) by mouth daily. for heartburn. 04/04/24  Yes Clark, Katherine K, NP  ondansetron  (ZOFRAN -ODT) 4 MG disintegrating tablet Take 1 tablet (4 mg total) by mouth every 8 (eight) hours as needed for nausea or vomiting. 08/28/24  Yes Gretta Comer POUR, NP  rosuvastatin  (CRESTOR ) 10 MG tablet TAKE 1 TABLET BY MOUTH EVERY DAY FOR CHOLESTEROL 03/21/24  Yes Clark, Katherine K, NP  saxagliptin  HCl (ONGLYZA) 2.5 MG TABS tablet TAKE 1 TABLET (2.5 MG TOTAL) BY MOUTH DAILY. FOR DIABETES. 07/20/24  Yes Clark, Katherine K, NP  sevelamer  carbonate (RENVELA ) 800 MG tablet Take 800 mg by mouth 3 (three) times daily with meals. 08/07/24  Yes [provider]  SYNTHROID  150 MCG tablet TAKE 1 TABLET BY MOUTH DAILY BEFORE BREAKFAST. 03/12/24  Yes Trixie File, MD  albuterol  (VENTOLIN  HFA) 108 (90 Base) MCG/ACT inhaler INHALE 2 PUFFS INTO THE LUNGS UP TO EVERY 6HRS AS NEEDED FOR  WHEEZING/SHORTNESS OF BREATH Patient not taking: Reported on 09/07/2024 09/06/23   Gretta Comer POUR, NP  AURYXIA  1 GM 210 MG(Fe) tablet Take 210 mg by mouth 3 (three) times daily with meals. Patient not taking: Reported on 09/07/2024 04/05/23   [provider]  calcitRIOL (ROCALTROL) 0.25 MCG capsule Take 0.25 mcg by mouth 3 (three) times a week. Patient not taking: Reported on 09/07/2024 08/07/24   [provider]  gabapentin  (NEURONTIN ) 100 MG capsule Take  1 capsule (100 mg total) by mouth 3 (three) times daily. Patient not taking: Reported on 09/07/2024 08/15/24   Lanis Fonda BRAVO, MD  HYDROcodone -acetaminophen  (NORCO/VICODIN) 5-325 MG tablet Take 1 tablet by mouth every 6 (six) hours as needed. Patient not taking: Reported on 08/28/2024 08/04/24   Arlice Reichert, MD  Lancets [facility] ULTRASOFT) lancets Use as instructed to test blood sugar daily 02/15/24   Clark, Katherine K, NP  lidocaine  (LIDODERM ) 5 % PLACE 1 PATCH ONTO THE SKIN DAILY. REMOVE & DISCARD PATCH WITHIN 12 HOURS OR AS DIRECTED BY MD Patient not taking: Reported on 08/28/2024 04/21/24   Clark, Katherine K, NP  losartan  (COZAAR ) 100 MG tablet Take 100 mg by mouth daily. Patient not taking: Reported on 08/28/2024 08/06/24   [provider]  losartan  (COZAAR ) 50 MG tablet Take 50 mg by mouth daily. Patient not taking: Reported on 08/28/2024 08/10/24   [provider]    Current Facility-Administered Medications  Medication Dose Route Frequency Provider Last Rate Last Admin   albuterol  (PROVENTIL ) (2.5 MG/3ML) 0.083% nebulizer solution 3 mL  3 mL Inhalation Q6H PRN Alfornia [name], MD       allopurinol  (ZYLOPRIM ) tablet 100 mg  100 mg Oral Daily Rathore, Vasundhra, MD   100 mg at 09/10/24 1024   budesonide  (PULMICORT ) nebulizer solution 0.25 mg  0.25 mg Nebulization BID Gherghe, Costin M, MD   0.25 mg at 09/09/24 1954   busPIRone  (BUSPAR ) tablet 5 mg  5 mg Oral BID Rathore, Vasundhra, MD   5  mg at 09/10/24 1024   dialysis solution 1.5% low-MG/low-CA dianeal  solution   Intraperitoneal Q24H Collins, Samantha G, PA-C       dolutegravir  ([name] ) tablet 50 mg  50 mg Oral Daily Rathore, Vasundhra, MD   50 mg at 09/10/24 1023   emtricitabine -tenofovir  AF (DESCOVY ) 200-25 MG per tablet 1 tablet  1 tablet Oral Daily Rathore, Vasundhra, MD   1 tablet at 09/10/24 1023   gentamicin  cream (GARAMYCIN ) 0.1 % 1 Application  1 Application Topical Daily Geralynn Charleston, MD   1 Application at 09/09/24 1158   heparin  sodium (porcine) 3,000 Units in dialysis solution 1.5% low-MG/low-CA 6,000 mL dialysis solution   Peritoneal Dialysis PRN Gherghe, Costin M, MD       HYDROcodone -acetaminophen  (NORCO/VICODIN) 5-325 MG per tablet 1 tablet  1 tablet Oral Q6H PRN Rathore, Vasundhra, MD   1 tablet at 09/09/24 1555   hydrOXYzine  (ATARAX ) tablet 25 mg  25 mg Oral TID PRN Raenelle Donalda HERO, MD   25 mg at 09/10/24 1023   levothyroxine  (SYNTHROID ) tablet 150 mcg  150 mcg Oral QAC breakfast Alfornia [name], MD   150 mcg at 09/10/24 0452   lidocaine  (LIDODERM ) 5 % 1 patch  1 patch Transdermal Q24H Rathore, Vasundhra, MD   1 patch at 09/08/24 0531   loratadine  (CLARITIN ) tablet 10 mg  10 mg Oral QPM Rathore, Vasundhra, MD   10 mg at 09/09/24 1837   methocarbamol  (ROBAXIN ) tablet 500 mg  500 mg Oral Q6H PRN Rathore, Vasundhra, MD   500 mg at 09/10/24 1024   naloxone  (NARCAN ) injection 0.4 mg  0.4 mg Intravenous PRN Alfornia [name], MD       nystatin  (MYCOSTATIN ) 100000 UNIT/ML suspension 500,000 Units  5 mL Oral QID Rathore, Vasundhra, MD   500,000 Units at 09/10/24 1024   ondansetron  (ZOFRAN ) injection 4 mg  4 mg Intravenous Q6H PRN Raenelle Donalda HERO, MD   4 mg at 09/09/24 8161   pantoprazole  (PROTONIX )  EC tablet 40 mg  40 mg Oral BID AC Ghimire, Donalda HERO, MD   40 mg at 09/10/24 1023   rosuvastatin  (CRESTOR ) tablet 10 mg  10 mg Oral Daily Alfornia [name], MD   10 mg at 09/10/24 1024   sevelamer  carbonate  (RENVELA ) tablet 800 mg  800 mg Oral TID WC Alfornia [name], MD   800 mg at 09/09/24 1555   vancomycin  (VANCOCIN ) capsule 125 mg  125 mg Oral QID Gherghe, Costin M, MD   125 mg at 09/10/24 1023    Allergies as of 09/07/2024 - Review Complete 09/07/2024  Allergen Reaction Noted   Genvoya [elviteg-cobic-emtricit-tenofaf] Hives 06/09/2015   Lisinopril Cough 02/14/2011   Aldactone  [spironolactone ] Hives 01/29/2023   Valium  [diazepam ] Other (See Comments) 08/01/2024   Tegaderm ag mesh [silver] Itching 10/23/2021    Social History   Socioeconomic History   Marital status: Single    Spouse name: Not on file   Number of children: 1   Years of education: Not on file   Highest education level: Not on file  Occupational History   Occupation: Investment Banker, [employer]  Tobacco Use   Smoking status: Former    Current packs/day: 0.00    Average packs/day: 0.5 packs/day for 15.0 years (7.5 ttl pk-yrs)    Types: Cigarettes    Start date: 04/12/2017    Quit date: 2019    Years since quitting: 6.9   Smokeless tobacco: Never  Vaping Use   Vaping status: Never Used  Substance and Sexual Activity   Alcohol  use: Not Currently    Comment: occasional wine   Drug use: No   Sexual activity: Not Currently    Birth control/protection: Post-menopausal  Other Topics Concern   Not on file  Social History Narrative   Right Handed    Lives in a two story home   Social Drivers of Health   Financial Resource Strain: Medium Risk (06/27/2024)   Overall Financial Resource Strain (CARDIA)    Difficulty of Paying Living Expenses: Somewhat hard  Food Insecurity: No Food Insecurity (09/08/2024)   Hunger Vital Sign    Worried About Running Out of Food in the Last Year: Never true    Ran Out of Food in the Last Year: Never true  Recent Concern: Food Insecurity - Food Insecurity Present (06/27/2024)   Hunger Vital Sign    Worried About Running Out of Food in the Last Year: Sometimes true    Ran Out of Food in  the Last Year: Sometimes true  Transportation Needs: No Transportation Needs (09/08/2024)   PRAPARE - Administrator, Civil Service (Medical): No    Lack of Transportation (Non-Medical): No  Physical Activity: Inactive (06/27/2024)   Exercise Vital Sign    Days of Exercise per Week: 0 days    Minutes of Exercise per Session: 0 min  Stress: Stress Concern Present (06/27/2024)   [employer] of Occupational Health - Occupational Stress Questionnaire    Feeling of Stress: To some extent  Social Connections: Unknown (06/27/2024)   Social Connection and Isolation Panel    Frequency of Communication with Friends and Family: More than three times a week    Frequency of Social Gatherings with Friends and Family: Three times a week    Attends Religious Services: More than 4 times per year    Active Member of Clubs or Organizations: No    Attends Banker Meetings: Never    Marital Status: Not on file  Intimate Partner  Violence: Not At Risk (09/08/2024)   Humiliation, Afraid, Rape, and Kick questionnaire    Fear of Current or Ex-Partner: No    Emotionally Abused: No    Physically Abused: No    Sexually Abused: No     Code Status   Code Status: Full Code  Review of Systems: All systems reviewed and negative except where noted in HPI.  Physical Exam: Vital signs in last 24 hours: Temp:  [97.5 F (36.4 C)-98.6 F (37 C)] 98.2 F (36.8 C) (11/25 0818) Pulse Rate:  [86-100] 94 (11/25 0315) Resp:  [14-20] 14 (11/25 0315) BP: (109-136)/(51-74) 109/66 (11/25 0818) SpO2:  [92 %-98 %] 94 % (11/25 0315) Last BM Date : 09/08/24  General:  Pleasant female in NAD Psych:  Cooperative. Normal mood and affect Eyes: Pupils equal Ears:  Normal auditory acuity Nose: No deformity, discharge or lesions Mouth: No obvious Candida Neck:  Supple, no masses felt Lungs:  Clear to auscultation.  Heart:  Regular rate, regular rhythm.  Abdomen:  Soft, nondistended, nontender,  active bowel sounds, no masses felt.  PD catheter covered with clean bandage Rectal :  Deferred Msk: Symmetrical without gross deformities.  Neurologic:  Alert, oriented, grossly normal neurologically Extremities : No edema Skin:  Intact without significant lesions.    Intake/Output from previous day: No intake/output data recorded. Intake/Output this shift:  Total I/O In: -625  Out: -    Vina Dasen, NP-C   09/10/2024, 11:28 AM  GI ATTENDING  History, laboratories, x-rays all personally reviewed.  Patient seen and examined as outlined above.  Agree with comprehensive consultation note as outlined above.  Patient has multiple significant medical problems as noted.  We are asked to see regarding abnormal esophagram and dysphagia.  Most likely has dysmotility.  We will plan upper endoscopy with possible esophageal dilation to address dysphagia and complaints of upper abdominal pain.  Otherwise, primary service to treat concomitant medical problems.  The patient is high risk for procedures given her comorbidities.  The entirety of this evaluation myself in the advanced practitioner equal to 80 minutes.  Level of medical decision making is deemed high.  Norleen SAILOR. Abran Raddle., M.D. [facility] Division of Gastroenterology

## 2024-09-10 NOTE — Progress Notes (Signed)
 [facility] Progress Note   Subjective:   Still hooked up to PD cycler, effluent is clear. Denies abdominal pain but had some cramping. Denies N/V/D this AM. No SOB or edema.   Objective Vitals:   09/09/24 2316 09/10/24 0248 09/10/24 0315 09/10/24 0818  BP: 110/64 124/62 (!) 113/51 109/66  Pulse: 100 97 94   Resp: 17 20 14    Temp: 97.8 F (36.6 C)  97.7 F (36.5 C) 98.2 F (36.8 C)  TempSrc: Oral  Oral Oral  SpO2: 95% 95% 94%   Weight:      Height:       Physical Exam  General: Alert female in NAD Heart: RRR, no murmurs Lungs: CTA bilaterally, respirations unlabored Abdomen: Soft, non-distended, +BS. No TTP Extremities: No edema b/l lower extremities Dialysis Access:  PD cath in R abdomen  Additional Objective Labs: Basic Metabolic Panel: Recent Labs  Lab 09/08/24 0527 09/09/24 0358 09/10/24 0246  NA 137 134* 136  K 3.7 3.2* 3.5  CL 95* 91* 92*  CO2 24 24 25   GLUCOSE 68* 77 148*  BUN 40* 38* 39*  CREATININE 15.88* 14.73* 14.70*  CALCIUM  8.3* 8.2* 8.4*  PHOS  --  7.0* 6.2*   Liver Function Tests: Recent Labs  Lab 09/07/24 1922 09/09/24 0358 09/10/24 0246  AST 28 22  --   ALT 13 11  --   ALKPHOS 228* 212*  --   BILITOT 1.0 1.0  --   PROT 7.3 6.5  --   ALBUMIN  2.3* 1.9* 1.9*   Recent Labs  Lab 09/07/24 1922  LIPASE 20   CBC: Recent Labs  Lab 09/07/24 1922 09/08/24 0527 09/09/24 0358  WBC 10.5 9.8 9.4  HGB 12.1 10.5* 10.9*  HCT 37.2 31.0* 32.2*  MCV 110.7* 108.4* 108.4*  PLT 207 177 173   Blood Culture    Component Value Date/Time   SDES FLUID 09/08/2024 1030   SPECREQUEST PERITONEAL 09/08/2024 1030   CULT  09/08/2024 1030    NO GROWTH 2 DAYS Performed at [facility], [address] [place], [place] [address]    REPTSTATUS PENDING 09/08/2024 1030    Cardiac Enzymes: No results for input(s): CKTOTAL, CKMB, CKMBINDEX, TROPONINI in the last 168 hours. CBG: Recent Labs  Lab 09/09/24 1538  09/09/24 2046 09/09/24 2319 09/10/24 0316 09/10/24 0820  GLUCAP 63* 116* 87 115* 109*   Iron  Studies: No results for input(s): IRON , TIBC, TRANSFERRIN, FERRITIN in the last 72 hours. 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 She reported dysphagia and globus sensation during meals leading to request for esophagram. EXAM: ESOPHAGUS/BARIUM SWALLOW/TABLET STUDY TECHNIQUE: Single contrast examination was performed using thin liquid barium. This exam was performed by Brittany Huneycutt, NP, and was supervised and interpreted by Dr. Rockey Kilts. FLUOROSCOPY: Radiation Exposure Index (as provided by the fluoroscopic device): 38.3 mGy Kerma COMPARISON:  None Available. FINDINGS: Swallowing: Appears normal. No vestibular penetration or aspiration seen. Pharynx: Unremarkable. Esophagus: Normal appearance. Esophageal motility: Mild esophageal dysmotility seen as tertiary contractions within the mid esophagus, best seen in series #2. Hiatal Hernia: Absent Gastroesophageal reflux: None visualized. Ingested 13 mm barium tablet: The tablet was delayed within the mid esophagus. This passed to the distal esophagus with subsequent  sips and remained at the GE junction despite providing additional fluids and applesauce Other: Radiopaque bodies consistent with above mentioned surgical history. 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 Mild delayed passage of a 13 mm barium tablet, likely secondary to dysmotility. Electronically Signed   By: Rockey Kilts M.D.   On: 09/09/2024 15:27   Medications:  dialysis solution 1.5% low-MG/low-CA     dialysis solution 1.5%  low-MG/low-CA      allopurinol   100 mg Oral Daily   budesonide  (PULMICORT ) nebulizer solution  0.25 mg Nebulization BID   busPIRone   5 mg Oral BID   dolutegravir   50 mg Oral Daily   emtricitabine -tenofovir  AF  1 tablet Oral Daily   gentamicin  cream  1 Application Topical Daily   levothyroxine   150 mcg Oral QAC breakfast   lidocaine   1 patch Transdermal Q24H   loratadine   10 mg Oral QPM   nystatin   5 mL Oral QID   pantoprazole   40 mg Oral BID AC   rosuvastatin   10 mg Oral Daily   sevelamer  carbonate  800 mg Oral TID WC   vancomycin   125 mg Oral QID    Dialysis Orders:  [facility] 7x week-[facility] 5 exchanges, fill vol 2250, 1.5hr dwell time 1/2 1.5% bags; 1/2 2.5% bags (usual per pt)    K+ 3.7, BUN 40, creat 15  Ca 8.3    Assessment/Plan: N/V/D/ abd pain:  Initial concern for possible PD cath related peritonitis. Empiric IV abx were started. PD fluid cell count was 61 and she does not have any abdominal pain today, culture NGTD, does not appear to be peritonitis. Reports nausea and diarrhea for several days, presumed c. Diff colitis per PMD. Improving.  ESRD: on [facility] nightly. Plan for PD again today.  BP: home bp meds on hold, BP's have not been high lately and are in fact borderline low with poor PO intake lately.  Volume: no vol excess on exam. Follow.  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  Lucie Collet, PA-C 09/10/2024, 9:23 AM  [facility] Pager: [phone]

## 2024-09-10 NOTE — Progress Notes (Addendum)
 PROGRESS NOTE        PATIENT DETAILS Name: [name] Age: 58 y.o. Sex: female Date of Birth: 02/06/1966 Admit Date: 09/07/2024 Admitting Physician Editha Ram, MD ERE:Rojmx, Comer POUR, NP  Brief Summary: Patient is a 58 y.o.  female ESRD on PD, HIV, recent history of pituitary apoplexy, HTN, papillary thyroid  carcinoma-s/p thyroidectomy, sarcoidosis-who presented with abdominal pain, nausea, vomiting and diarrhea.  Initially-there was concern for PD catheter associated peritonitis-however workup was not consistent with peritonitis, subsequent stools since studies were obtained-which were suggestive of C. difficile colitis (recent antibiotic exposure apparently 3 weeks back)-and started on oral vancomycin .  See below for further details.  Significant events: 11/23>> admit to [facility].  Significant studies: 11/22>> CXR: No PNA 11/22>> CT abdomen: Stent graft repair of aortic dissection with continued dissection throughout the abdominal aorta-stable since prior study.  Small amount of ascites. 11/23>> peritoneal fluid: WBC 61 11/24>> barium esophagogram: Dysmotility-delayed passage of 13 mm barium tablet likely secondary to dysmotility.  Significant microbiology data: 11/22>> COVID/influenza/RSV PCR: Negative 11/23>> GI pathogen panel: Negative 11/23>> stool C. difficile antigen positive, toxin negative. 11/23>> peritoneal fluid culture: No growth  Procedures: None  Consults: Nephrology GI  Subjective: No diarrhea.  Abdominal pain has essentially resolved.  Very poor historian-continues to complain of food getting stuck in the middle of her throat-points to her chest.  Also acknowledges significant belching/reflux.  But at the same time also claims that she has a very poor appetite.  Objective: Vitals: Blood pressure 109/66, pulse 94, temperature 98.2 F (36.8 C), temperature source Oral, resp. rate 14, height 5' 5 (1.651 m), weight  80.7 kg, last menstrual period 03/31/2014, SpO2 94%.   Exam: Gen Exam:Alert awake-not in any distress HEENT:atraumatic, normocephalic Chest: B/L clear to auscultation anteriorly CVS:S1S2 regular Abdomen:soft non tender, non distended Extremities:no edema Neurology: Non focal Skin: no rash  Pertinent Labs/Radiology:    Latest Ref Rng & Units 09/09/2024    3:58 AM 09/08/2024    5:27 AM 09/07/2024    7:22 PM  CBC  WBC 4.0 - 10.5 K/uL 9.4  9.8  10.5   Hemoglobin 12.0 - 15.0 g/dL 89.0  89.4  87.8   Hematocrit 36.0 - 46.0 % 32.2  31.0  37.2   Platelets 150 - 400 K/uL 173  177  207     Lab Results  Component Value Date   NA 136 09/10/2024   K 3.5 09/10/2024   CL 92 (L) 09/10/2024   CO2 25 09/10/2024      Assessment/Plan: Presumed C. difficile colitis Although only stool C. difficile antigen but with Antibiotic exposure for UTI approximately 3 weeks back (took for 1 week)-diarrhea has improved after starting oral vancomycin . Continue oral vancomycin  for now Attempt to minimize antibiotic exposure in the future.  Solid/liquid food dysphagia Ongoing x 3 weeks Does have strong history of reflux/GERD-already on PPI-was switched to twice daily dosing yesterday Barium esophagogram with no major strictures-but does appear to have slow passage to the GE junction. Due to persistent symptoms-have consulted GI this morning.  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 Rosslyn Repeat MRI scheduled for December Per chart review-cortisol/LH/FSH/ACTH  levels were all within normal limits Currently no signs of adrenal crisis-continue to monitor closely and have patient follow-up with neurosurgery as planned.  HIV (CD4 445 on 10/15)  Continue ART  History of papillary thyroid  carcinoma-s/p thyroidectomy Synthroid -TSH stable  DM-2 (A1c 6.1) No longer SSI-CBG stable  Recent Labs    09/09/24 2319 09/10/24 0316  09/10/24 0820  GLUCAP 87 115* 109*     ESRD on PD Nephrology following  Normocytic anemia Secondary to ESRD-defer Aranesp /iron  therapies to nephrology service  Hypokalemia Secondary diarrhea Repleted.  History of aortic dissection-s/p stent graft November 2024 CT abdomen-stable-unchanged from prior Supportive care. Outpatient follow-up with vascular/cardiothoracic surgery.  Gout Allopurinol   Chronic low back pain Unchanged-stable Continue as needed narcotics/Robaxin .  Anxiety/depression Stable BuSpar   Code status:   Code Status: Full Code   DVT Prophylaxis: SCDs Start: 09/08/24 0429   Family Communication: None at bedside   Disposition Plan: Status is: Inpatient Remains inpatient appropriate because: Severity of illness   Planned Discharge Destination:Home   Diet: Diet Order             Diet renal with fluid restriction Fluid restriction: 1200 mL Fluid; Room service appropriate? Yes; Fluid consistency: Thin  Diet effective now                     Antimicrobial agents: Anti-infectives (From admission, onward)    Start     Dose/Rate Route Frequency Ordered Stop   09/08/24 1400  vancomycin  (VANCOCIN ) capsule 125 mg        125 mg Oral 4 times daily 09/08/24 1333 09/18/24 1359   09/08/24 1000  dolutegravir  ([name] ) tablet 50 mg        50 mg Oral Daily 09/08/24 0427     09/08/24 1000  emtricitabine -tenofovir  AF (DESCOVY ) 200-25 MG per tablet 1 tablet        1 tablet Oral Daily 09/08/24 0427     09/08/24 0600  vancomycin  (VANCOREADY) IVPB 2000 mg/400 mL        2,000 mg 200 mL/hr over 120 Minutes Intravenous  Once 09/08/24 0433 09/08/24 0753   09/08/24 0600  piperacillin -tazobactam (ZOSYN ) IVPB 2.25 g  Status:  Discontinued        2.25 g 100 mL/hr over 30 Minutes Intravenous Every 8 hours 09/08/24 0435 09/08/24 1518   09/08/24 0514  vancomycin  variable dose per unstable renal function (pharmacist dosing)  Status:  Discontinued         Does not  apply See admin instructions 09/08/24 0514 09/09/24 0859   09/07/24 2330  piperacillin -tazobactam (ZOSYN ) IVPB 3.375 g        3.375 g 100 mL/hr over 30 Minutes Intravenous  Once 09/07/24 2323 09/08/24 9662        MEDICATIONS: Scheduled Meds:  allopurinol   100 mg Oral Daily   budesonide  (PULMICORT ) nebulizer solution  0.25 mg Nebulization BID   busPIRone   5 mg Oral BID   dolutegravir   50 mg Oral Daily   emtricitabine -tenofovir  AF  1 tablet Oral Daily   gentamicin  cream  1 Application Topical Daily   levothyroxine   150 mcg Oral QAC breakfast   lidocaine   1 patch Transdermal Q24H   loratadine   10 mg Oral QPM   nystatin   5 mL Oral QID   pantoprazole   40 mg Oral BID AC   rosuvastatin   10 mg Oral Daily   sevelamer  carbonate  800 mg Oral TID WC   vancomycin   125 mg Oral QID   Continuous Infusions:  dialysis solution 1.5% low-MG/low-CA     PRN Meds:.albuterol , heparin  sodium (porcine) 3,000 Units in dialysis solution 1.5% low-MG/low-CA 6,000 mL dialysis solution, HYDROcodone -acetaminophen , hydrOXYzine , methocarbamol , naLOXone  (NARCAN )  injection, ondansetron  (ZOFRAN ) IV   I have personally reviewed following labs and imaging studies  LABORATORY DATA: CBC: Recent Labs  Lab 09/07/24 1922 09/08/24 0527 09/09/24 0358  WBC 10.5 9.8 9.4  HGB 12.1 10.5* 10.9*  HCT 37.2 31.0* 32.2*  MCV 110.7* 108.4* 108.4*  PLT 207 177 173    Basic Metabolic Panel: Recent Labs  Lab 09/07/24 1922 09/08/24 0527 09/09/24 0358 09/10/24 0246  NA 135 137 134* 136  K 3.8 3.7 3.2* 3.5  CL 91* 95* 91* 92*  CO2 22 24 24 25   GLUCOSE 95 68* 77 148*  BUN 37* 40* 38* 39*  CREATININE 15.35* 15.88* 14.73* 14.70*  CALCIUM  8.8* 8.3* 8.2* 8.4*  MG  --   --  1.7  --   PHOS  --   --  7.0* 6.2*    GFR: Estimated Creatinine Clearance: 4.5 mL/min (A) (by C-G formula based on SCr of 14.7 mg/dL (H)).  Liver Function Tests: Recent Labs  Lab 09/07/24 1922 09/09/24 0358 09/10/24 0246  AST 28 22  --    ALT 13 11  --   ALKPHOS 228* 212*  --   BILITOT 1.0 1.0  --   PROT 7.3 6.5  --   ALBUMIN  2.3* 1.9* 1.9*   Recent Labs  Lab 09/07/24 1922  LIPASE 20   No results for input(s): AMMONIA in the last 168 hours.  Coagulation Profile: No results for input(s): INR, PROTIME in the last 168 hours.  Cardiac Enzymes: No results for input(s): CKTOTAL, CKMB, CKMBINDEX, TROPONINI in the last 168 hours.  BNP (last 3 results) No results for input(s): PROBNP in the last 8760 hours.  Lipid Profile: No results for input(s): CHOL, HDL, LDLCALC, TRIG, CHOLHDL, LDLDIRECT in the last 72 hours.  Thyroid  Function Tests: Recent Labs    09/08/24 1039  TSH 0.354    Anemia Panel: No results for input(s): VITAMINB12, FOLATE, FERRITIN, TIBC, IRON , RETICCTPCT in the last 72 hours.  Urine analysis:    Component Value Date/Time   COLORURINE YELLOW 07/18/2024 2142   APPEARANCEUR HAZY (A) 07/18/2024 2142   LABSPEC 1.025 07/18/2024 2142   PHURINE 5.0 07/18/2024 2142   GLUCOSEU NEGATIVE 07/18/2024 2142   GLUCOSEU NEG mg/dL 95/91/7990 8581   HGBUR NEGATIVE 07/18/2024 2142   BILIRUBINUR SMALL (A) 07/18/2024 2142   BILIRUBINUR neg 10/19/2022 1340   KETONESUR NEGATIVE 07/18/2024 2142   PROTEINUR 30 (A) 07/18/2024 2142   UROBILINOGEN 0.2 10/19/2022 1340   UROBILINOGEN 0.2 10/21/2014 2347   NITRITE NEGATIVE 07/18/2024 2142   LEUKOCYTESUR MODERATE (A) 07/18/2024 2142    Sepsis Labs: Lactic Acid, Venous    Component Value Date/Time   LATICACIDVEN 1.2 03/20/2024 1717    MICROBIOLOGY: Recent Results (from the past 240 hours)  Resp panel by RT-PCR (RSV, Flu A&B, Covid) Anterior Nasal Swab     Status: None   Collection Time: 09/07/24  7:57 PM   Specimen: Anterior Nasal Swab  Result Value Ref Range Status   SARS Coronavirus 2 by RT PCR NEGATIVE NEGATIVE Final   Influenza A by PCR NEGATIVE NEGATIVE Final   Influenza B by PCR NEGATIVE NEGATIVE Final     Comment: (NOTE) The Xpert Xpress SARS-CoV-2/FLU/RSV plus assay is intended as an aid in the diagnosis of influenza from Nasopharyngeal swab specimens and should not be used as a sole basis for treatment. Nasal washings and aspirates are unacceptable for Xpert Xpress SARS-CoV-2/FLU/RSV testing.  Fact Sheet for Patients: bloggercourse.com  Fact Sheet for Healthcare Providers: seriousbroker.it  This test is not yet approved or cleared by the [place]  FDA and has been authorized for detection and/or diagnosis of SARS-CoV-2 by FDA under an Emergency Use Authorization (EUA). This EUA will remain in effect (meaning this test can be used) for the duration of the COVID-19 declaration under Section 564(b)(1) of the Act, 21 U.S.C. section 360bbb-3(b)(1), unless the authorization is terminated or revoked.     Resp Syncytial Virus by PCR NEGATIVE NEGATIVE Final    Comment: (NOTE) Fact Sheet for Patients: bloggercourse.com  Fact Sheet for Healthcare Providers: seriousbroker.it  This test is not yet approved or cleared by the [place]  FDA and has been authorized for detection and/or diagnosis of SARS-CoV-2 by FDA under an Emergency Use Authorization (EUA). This EUA will remain in effect (meaning this test can be used) for the duration of the COVID-19 declaration under Section 564(b)(1) of the Act, 21 U.S.C. section 360bbb-3(b)(1), unless the authorization is terminated or revoked.  Performed at [facility], [address] [place], [place] [address]   C Difficile Quick Screen w PCR reflex     Status: Abnormal   Collection Time: 09/08/24  4:30 AM   Specimen: STOOL  Result Value Ref Range Status   C Diff antigen POSITIVE (A) NEGATIVE Final   C Diff toxin NEGATIVE NEGATIVE Final   C Diff interpretation Results are indeterminate. See PCR results.  Final    Comment: Performed  at [facility], [address], [place], [place] [address]  Gastrointestinal Panel by PCR , Stool     Status: None   Collection Time: 09/08/24  4:30 AM   Specimen: STOOL  Result Value Ref Range Status   Campylobacter species NOT DETECTED NOT DETECTED Final   Plesimonas shigelloides NOT DETECTED NOT DETECTED Final   Salmonella species NOT DETECTED NOT DETECTED Final   Yersinia enterocolitica NOT DETECTED NOT DETECTED Final   Vibrio species NOT DETECTED NOT DETECTED Final   Vibrio cholerae NOT DETECTED NOT DETECTED Final   Enteroaggregative E coli (EAEC) NOT DETECTED NOT DETECTED Final   Enteropathogenic E coli (EPEC) NOT DETECTED NOT DETECTED Final   Enterotoxigenic E coli (ETEC) NOT DETECTED NOT DETECTED Final   Shiga like toxin producing E coli (STEC) NOT DETECTED NOT DETECTED Final   Shigella/Enteroinvasive E coli (EIEC) NOT DETECTED NOT DETECTED Final   Cryptosporidium NOT DETECTED NOT DETECTED Final   Cyclospora cayetanensis NOT DETECTED NOT DETECTED Final   Entamoeba histolytica NOT DETECTED NOT DETECTED Final   Giardia lamblia NOT DETECTED NOT DETECTED Final   Adenovirus F40/41 NOT DETECTED NOT DETECTED Final   Astrovirus NOT DETECTED NOT DETECTED Final   Norovirus GI/GII NOT DETECTED NOT DETECTED Final   Rotavirus A NOT DETECTED NOT DETECTED Final   Sapovirus (I, II, IV, and V) NOT DETECTED NOT DETECTED Final    Comment: Performed at [facility], [address] [place], [place] [address]  C. Diff by PCR, Reflexed     Status: None   Collection Time: 09/08/24  4:30 AM  Result Value Ref Range Status   Toxigenic C. Difficile by PCR NEGATIVE NEGATIVE Final    Comment: Patient is colonized with non toxigenic C. difficile. May not need treatment unless significant symptoms are present.   Hypervirulent Strain PRESUMPTIVE NEGATIVE PRESUMPTIVE NEGATIVE Final    Comment: Performed at [facility], [address] [place], [place] [address]  Body fluid culture w  Gram Stain     Status: None (Preliminary result)  Collection Time: 09/08/24 10:30 AM   Specimen: Path fluid; Body Fluid  Result Value Ref Range Status   Specimen Description FLUID  Final   Special Requests PERITONEAL  Final   Gram Stain NO WBC SEEN NO ORGANISMS SEEN   Final   Culture   Final    NO GROWTH 2 DAYS Performed at [facility], [address] [place], [place] [address]    Report Status PENDING  Incomplete    RADIOLOGY STUDIES/RESULTS: DG ESOPHAGUS W SINGLE CM (SOL OR THIN BA) Result Date: 09/09/2024 CLINICAL DATA:  Inpatient with history of ESRD on PD, DM 2, aortic dissection status post endovascular repair, prior DVT not on anticoagulation anymore, HTN, HIV, HLD, 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 She reported dysphagia and globus sensation during meals leading to request for esophagram. EXAM: ESOPHAGUS/BARIUM SWALLOW/TABLET STUDY TECHNIQUE: Single contrast examination was performed using thin liquid barium. This exam was performed by Brittany Huneycutt, NP, and was supervised and interpreted by Dr. Rockey Kilts. FLUOROSCOPY: Radiation Exposure Index (as provided by the fluoroscopic device): 38.3 mGy Kerma COMPARISON:  None Available. FINDINGS: Swallowing: Appears normal. No vestibular penetration or aspiration seen. Pharynx: Unremarkable. Esophagus: Normal appearance. Esophageal motility: Mild esophageal dysmotility seen as tertiary contractions within the mid esophagus, best seen in series #2. Hiatal Hernia: Absent Gastroesophageal reflux: None visualized. Ingested 13 mm barium tablet: The tablet was delayed within the mid esophagus. This passed to the distal esophagus with subsequent sips and remained at the GE junction despite providing additional fluids and applesauce Other: Radiopaque bodies consistent with above mentioned surgical history. 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  Mild delayed passage of a 13 mm barium tablet, likely secondary to dysmotility. Electronically Signed   By: Rockey Kilts M.D.   On: 09/09/2024 15:27     LOS: 2 days   Donalda Applebaum, MD  [facility]    To contact the attending provider between 7A-7P or the covering provider during after hours 7P-7A, please log into the web site [email or web address] and access using universal [place] password for that web site. If you do not have the password, please call the hospital operator.  09/10/2024, 11:28 AM

## 2024-09-10 NOTE — H&P (View-Only) (Signed)
 Consultation Note   Referring Provider:   [facility] PCP: Gretta Comer POUR, NP Primary Gastroenterologist:   Kavitha Nandigam, MD     Reason for Consultation: Dysphagia, abnormal barium swallow DOA: 09/07/2024         Hospital Day: 4   ASSESSMENT    58 year old female with multiple comorbidities admitted with nausea, vomiting, poor appetite, diarrhea.  Treated empirically for C. difficile in setting of recent antibiotic exposure and positive C. difficile antigen .  Diarrhea has resolved .  Nausea and vomiting improved .  Still having intermittent upper abdominal discomfort of unclear etiology Noncontrast CT scan on admission without acute findings.  Workup on admission negative for PD catheter related peritonitis.   Solid food / liquid dysphagia  Esophageal dysmotility Abnormal barium swallow Weight loss Symptoms possibly due to esophageal dysmotility.  Stricture also possible especially given history of RAI.  No prior history of EGD . She gives a 6 month history of dysphagia to solids and liquids. However we saw her in 2024 for similar symptoms so symptoms seem to be more chronic. Barium swallow demonstrating esophageal dysmotility and delayed passage of barium tablet in mid esophagus and then retaining of tablet at GE junction despite applesauce. . .    History of Graves disease History of thyroid  cancer and is status post RAI and thyroidectomy  End-stage renal disease on CCPD  Chronic anemia, likely anemia of chronic disease Hemoglobin stable at 10.9.  No overt GI blood loss She is up-to-date on screening colonoscopy  Type 2 diabetes.  Cholelithiasis No evidence for acute cholecystitis on noncontrast CT scan  Chronically elevated alkaline phosphatase < 2x ULN. Related to renal disease?   HIV On treatment, followed outpatient by ID  History of aortic dissection status post endovascular repair  See PMH for any  additional medical history  / medical problems  Principal Problem:   Peritonitis (HCC) Active Problems:   HIV (human immunodeficiency virus infection) (HCC)   Type 2 diabetes mellitus with hyperlipidemia (HCC)   ESRD (end stage renal disease) (HCC)   Diarrhea    PLAN:   Will likely need upper endoscopy this admission for evaluation of dysphagia, abnormal barium swallow.  We can also look for ulcers , gastritis, etc as causes of her ongoing upper abdominal pain at the time. Time of procedure to be announced.   HPI   Brief History:   [name] is a 58 year old female with history of  thyroid  cancer and is status post RAI and thyroidectomy She was seen in our office March 2024 with complaints of dysphagia.  Our plan was for an esophagram, appears that was not done and we have not seen her in the office since.   Interval history  Patient was admitted several days ago with nausea and vomiting, abdominal pain. Going days without PO intake. Also having nonbloody diarrhea. There was concern for PD catheter associated peritonitis but workup negative for peritonitis.  Stool studies positive for C. difficile antigen , toxin negative , and PCR negative but due to recent exposure to antibiotics she was treated empirically with oral vancomycin  .  Diarrhea has resolved.  No longer having nausea and vomiting.  She does continue to have generalized upper abdominal pain of  unclear etiology and her appetite is poor though she has been having dysphagia to both solids and liquids over the last 6 months.  She reports significant weight loss due to dysphagia.  She has to be careful with everything she eats or will get stuck in her esophagus.  Once food gets stuck then even liquids are hard to get down.  Esophagram this admission showing esophageal motility and slow passage of barium to the mid esophagus (see below).  Following that, tablet held up to GE junction despite giving applesauce. She has no odynophagia.   Patient has no GERD symptoms.  Outside provider recently put her on reflux medication for dysphagia symptoms but she said it has not helped.  No other GI complaints                                                         Pertinent GI Studies   January 2020 screening colonoscopy -- Diverticulosis in the sigmoid colon.  Exam otherwise normal.  No polyps   Labs and Imaging:  Recent Labs    09/07/24 1922 09/09/24 0358 09/10/24 0246  PROT 7.3 6.5  --   ALBUMIN  2.3* 1.9* 1.9*  AST 28 22  --   ALT 13 11  --   ALKPHOS 228* 212*  --   BILITOT 1.0 1.0  --    Recent Labs    09/07/24 1922 09/08/24 0527 09/09/24 0358  WBC 10.5 9.8 9.4  HGB 12.1 10.5* 10.9*  HCT 37.2 31.0* 32.2*  MCV 110.7* 108.4* 108.4*  PLT 207 177 173   Recent Labs    09/08/24 0527 09/09/24 0358 09/10/24 0246  NA 137 134* 136  K 3.7 3.2* 3.5  CL 95* 91* 92*  CO2 24 24 25   GLUCOSE 68* 77 148*  BUN 40* 38* 39*  CREATININE 15.88* 14.73* 14.70*  CALCIUM  8.3* 8.2* 8.4*     DG ESOPHAGUS W SINGLE CM (SOL OR THIN BA) CLINICAL DATA:  Inpatient with history of ESRD on PD, DM 2, aortic dissection status post endovascular repair, prior DVT not on anticoagulation anymore, HTN, HIV, HLD, 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 She reported dysphagia and globus sensation during meals leading to request for esophagram.  EXAM: ESOPHAGUS/BARIUM SWALLOW/TABLET STUDY  TECHNIQUE: Single contrast examination was performed using thin liquid barium. This exam was performed by Brittany Huneycutt, NP, and was supervised and interpreted by Dr. Rockey Kilts.  FLUOROSCOPY: Radiation Exposure Index (as provided by the fluoroscopic device): 38.3 mGy Kerma  COMPARISON:  None Available.  FINDINGS: Swallowing: Appears normal. No vestibular penetration or aspiration seen.  Pharynx: Unremarkable.  Esophagus: Normal appearance.  Esophageal motility: Mild  esophageal dysmotility seen as tertiary contractions within the mid esophagus, best seen in series #2.  Hiatal Hernia: Absent  Gastroesophageal reflux: None visualized.  Ingested 13 mm barium tablet: The tablet was delayed within the mid esophagus. This passed to the distal esophagus with subsequent sips and remained at the GE junction despite providing additional fluids and applesauce  Other: Radiopaque bodies consistent with above mentioned surgical history.  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 Mild delayed passage of a 13 mm barium tablet, likely secondary to dysmotility.  Electronically Signed   By: Rockey Kilts M.D.   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  Genes Analyzed (43 total): APC*, ATM*, AXIN2, BARD1, BMPR1A,  BRCA1*, BRCA2*, BRIP1*, CDH1*, CDK4, CDKN2A, CHEK2*, DICER1, GALNT12, HOXB13, MEN1, MLH1*, MRE11A, MSH2*, MSH3, MSH6*, MUTYH*, NBN, NF1*, NTHL1, PALB2*, PMS2*, POLD1, POLE, PTEN*, RAD50, RAD51C*, RAD51D*, RET, SDHB, SDHD, SMAD4, SMARCA4, STK11 and TP53* (sequencing and deletion/duplication); EGFR (s   GERD 07/24/2006   GRAVE'S DISEASE 01/01/2008   History of hidradenitis suppurativa    History of kidney stones    History of thrush    HIV DISEASE 07/24/2006   dx March 05   Hyperlipidemia    HYPERTENSION 07/24/2006   Hyperthyroidism 08/2006   Grave's Disease -diffuse radiotracer uptake 08/25/06 Thyroid  scan-Cold nodule to R lower lobe of thyrorid   Ileus (HCC) 02/14/2023   Left bundle branch block (LBBB)    Malnutrition of moderate degree 02/08/2023   Menometrorrhagia    hx of   Nephrolithiasis    Panniculitis 05/12/2020   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 Completed 2 yrs Prednisone  after lung bx confirmation. No symptoms since then.   Sebaceous cyst 04/21/2020   Suppurative hidradenitis    Thyroid  cancer (HCC)    THYROID  NODULE, RIGHT 02/08/2007    Past Surgical History:  Procedure Laterality Date   AORTIC ARCH DEBRANCHING N/A 08/25/2023   Procedure: AORTIC ARCH DEBRANCHING USING 12X8X8MM HEMASHIELD GRAFT;  Surgeon: Lucas Dorise POUR, MD;  Location: [facility];  Service: Open Heart Surgery;  Laterality: N/A;   with bypasses to the innominate and left common carotid arteries   APPLICATION OF WOUND VAC N/A 01/20/2021   Procedure: APPLICATION OF WOUND VAC;  Surgeon: Lowery Estefana RAMAN, DO;  Location: [facility];  Service: Plastics;  Laterality: N/A;   BREAST EXCISIONAL  BIOPSY Right 04/26/2018   right axilla negative   BREAST EXCISIONAL BIOPSY Left 04/26/2018   left axilla negative   BREAST LUMPECTOMY Right 10/03/2018   malignant   BREAST LUMPECTOMY WITH RADIOACTIVE SEED AND SENTINEL LYMPH NODE BIOPSY Right 10/03/2018   Procedure: RIGHT BREAST LUMPECTOMY WITH RADIOACTIVE SEED AND SENTINEL LYMPH NODE MAPPING;  Surgeon: Vanderbilt Ned, MD;  Location: [facility];  Service: General;  Laterality: Right;   BREAST SURGERY  1997   Breast Reduction    CAPD INSERTION N/A 12/21/2023   Procedure: LAPAROSCOPIC INSERTION CONTINUOUS AMBULATORY PERITONEAL DIALYSIS  (CAPD) CATHETER; LAPAROSCOPIC OMENTUMPEXY;  Surgeon: Magda Ned SAILOR, MD;  Location: [facility];  Service: Vascular;  Laterality: N/A;   CYSTOSCOPY W/ URETERAL STENT REMOVAL  11/09/2012   Procedure: CYSTOSCOPY WITH STENT REMOVAL;  Surgeon: Ricardo Likens, MD;  Location: [facility];  Service: Urology;  Laterality: Right;   CYSTOSCOPY WITH RETROGRADE PYELOGRAM, URETEROSCOPY AND STENT PLACEMENT  11/09/2012   Procedure: CYSTOSCOPY WITH RETROGRADE PYELOGRAM, URETEROSCOPY AND STENT PLACEMENT;  Surgeon: Ricardo Likens, MD;  Location: [facility];  Service: Urology;  Laterality: Left;  LEFT URETEROSCOPY, STONE MANIPULATION, left STENT exchange    CYSTOSCOPY WITH STENT PLACEMENT  10/02/2012   Procedure: CYSTOSCOPY WITH STENT PLACEMENT;  Surgeon: Ricardo Likens, MD;  Location: [facility];  Service: Urology;  Laterality: Left;   DEBRIDEMENT AND CLOSURE WOUND N/A 01/20/2021   Procedure: Excision of abdominal wound with closure;  Surgeon: Lowery Estefana RAMAN, DO;  Location: [facility];  Service: Plastics;  Laterality: N/A;   DILATION AND CURETTAGE OF UTERUS   11/2002   s/p for 1st trimester nonviable pregnancy   EYE SURGERY     sty under eyelid   INCISE AND DRAIN ABCESS  08/2002   s/p I &D for righ inframmary fold hidradenitis   INCISION AND DRAINAGE PERITONSILLAR ABSCESS  12/2001   IR CV LINE INJECTION  06/07/2018   IR FLUORO GUIDE CV LINE RIGHT  01/30/2023   IR IMAGING GUIDED PORT INSERTION  06/20/2018   IR REMOVAL TUN ACCESS W/ PORT W/O FL MOD SED  06/20/2018   IR US  GUIDE VASC ACCESS RIGHT  01/30/2023   IRRIGATION AND DEBRIDEMENT ABSCESS  01/31/2012   Procedure: IRRIGATION AND DEBRIDEMENT ABSCESS;  Surgeon: Alm VEAR Angle, MD;  Location: [facility];  Service: General;  Laterality: Right;  right breast and axilla    LAPAROSCOPIC REPOSITIONING CAPD CATHETER Left 01/10/2024   Procedure: Removal of peritoneal dialysis Catheter.;  Surgeon: Magda Ned SAILOR, MD;  Location: [facility] OR;  Service: Vascular;  Laterality: Left;   LAPAROSCOPY N/A 01/10/2024   Procedure: LAPAROSCOPY, DIAGNOSTIC;  Surgeon: Magda Ned SAILOR, MD;  Location: [facility] OR;  Service: Vascular;  Laterality: N/A;   NEPHROLITHOTOMY  10/02/2012   Procedure: NEPHROLITHOTOMY PERCUTANEOUS;  Surgeon: Ricardo Likens, MD;  Location: [facility];  Service: Urology;  Laterality: Right;  First Stage Percutaneous Nephrolithotomy with Surgeon Access, Left Ureteral Stent     NEPHROLITHOTOMY  10/04/2012   Procedure: NEPHROLITHOTOMY PERCUTANEOUS SECOND LOOK;  Surgeon: Ricardo Likens, MD;  [facility]: [facility];  Service: Urology;  Laterality: Right;      NEPHROLITHOTOMY  10/08/2012   Procedure: NEPHROLITHOTOMY PERCUTANEOUS;  Surgeon: Ricardo Likens, MD;  Location: [facility];  Service: Urology;  Laterality: Right;  THIRD STAGE, nephrostomy tube exchange x 2   NEPHROLITHOTOMY  10/11/2012   Procedure: NEPHROLITHOTOMY PERCUTANEOUS SECOND LOOK;  Surgeon: Ricardo Likens, MD;  [facility]: [facility];  Service: Urology;  Laterality: Right;  RIGHT 4 STAGE PERCUTANOUS NEPHROLITHOTOMY, right URETEROSCOPY  WITH HOLMIUM LASER    PANNICULECTOMY  N/A 12/21/2020   Procedure: PANNICULECTOMY;  Surgeon: Lowery Estefana RAMAN, DO;  Location: [facility];  Service: Plastics;  Laterality: N/A;   PERCUTANEOUS NEPHROSTOLITHOTOMY  04/2022   PORT-A-CATH REMOVAL N/A 07/16/2020   Procedure: REMOVAL PORT-A-CATH;  Surgeon: Vanderbilt Ned, MD;  Location: [facility];  Service: General;  Laterality: N/A;   PORTACATH PLACEMENT Left 05/17/2018   Procedure: INSERTION PORT-A-CATH;  Surgeon: Vernetta Berg, MD;  Location: [facility];  Service: General;  Laterality: Left;   RADICAL NECK DISSECTION  04/12/2017   limited/notes 04/12/2017   RADICAL NECK DISSECTION N/A 04/12/2017   Procedure: RADICAL NECK DISSECTION;  Surgeon: Carlie Clark, MD;  Location: [facility] OR;  Service: ENT;  Laterality: N/A;  limited neck dissection 2 hours total   REDUCTION MAMMAPLASTY Bilateral 1998   RIGHT/LEFT HEART CATH AND CORONARY ANGIOGRAPHY N/A 03/12/2020   Procedure: RIGHT/LEFT HEART CATH AND CORONARY ANGIOGRAPHY;  Surgeon: Cherrie Toribio SAUNDERS, MD;  [facility];  Service: Cardiovascular;  Laterality: N/A;   [place]  1994   TEE WITHOUT CARDIOVERSION N/A 08/25/2023   Procedure: TRANSESOPHAGEAL ECHOCARDIOGRAM;  Surgeon: Lucas Dorise POUR, MD;  Location: [facility];  Service: Open Heart Surgery;  Laterality: N/A;   TENCKHOFF CATHETER INSERTION Left 01/10/2024   Procedure: INSERTION, CATHETER, DIALYSIS, PERITONEAL;  Surgeon: Magda Ned SAILOR, MD;  Location: [facility];  Service: Vascular;  Laterality: Left;   THORACIC AORTIC ENDOVASCULAR STENT GRAFT N/A 02/02/2023   Procedure: THORACIC AORTIC ENDOVASCULAR STENT GRAFT;  Surgeon: Lanis Fonda BRAVO, MD;  Location: [facility] OR;  Service: Vascular;  Laterality: N/A;   THORACIC AORTIC ENDOVASCULAR STENT GRAFT N/A 08/25/2023   Procedure: THORACIC AORTIC ENDOVASCULAR STENT GRAFT;  Surgeon: Lanis Fonda BRAVO, MD;  Location: [facility] OR;  Service: Vascular;  Laterality: N/A;   THYROIDECTOMY  04/12/2017   completion/notes 04/12/2017    THYROIDECTOMY N/A 04/12/2017   Procedure: THYROIDECTOMY;  Surgeon: Carlie Clark, MD;  Location: [facility] OR;  Service: ENT;  Laterality: N/A;  Completion Thyroidectomy   TOTAL THYROIDECTOMY  2010   ULTRASOUND GUIDANCE FOR VASCULAR ACCESS Right 08/25/2023   Procedure: ULTRASOUND GUIDANCE FOR VASCULAR ACCESS, RIGHT FEMORAL ARTERY;  Surgeon: Lanis Fonda BRAVO, MD;  Location: [facility];  Service: Vascular;  Laterality: Right;    Family History  Problem Relation Age of Onset   Hypertension Mother    Cancer Mother        laryngeal   Heart disease Mother        stent   Pancreatic cancer Father    Hypertension Father    Lung cancer Father 35       hx smoking   Hypertension Sister    Hypertension Sister    Hypertension Brother    Breast cancer Maternal Aunt 32   Breast cancer Maternal Aunt        dx 60+   Cancer Maternal Uncle        Lung CA   Breast cancer Paternal Aunt 77   Breast cancer Paternal Aunt        dx 29's   Breast cancer Paternal Aunt        dx 50's   Prostate cancer Paternal Uncle    Prostate cancer Paternal Uncle    Lung cancer Paternal Uncle    Breast cancer Cousin 61   Breast cancer Cousin        dx <50   Breast cancer Cousin        dx <50  Breast cancer Cousin        dx <50   Heart disease Other    Hypertension Other    Stroke Other        Grandparent   Kidney disease Other        Grandparent   Diabetes Other        FH of Diabetes   Colon cancer Neg Hx    Esophageal cancer Neg Hx    Rectal cancer Neg Hx    Stomach cancer Neg Hx     Prior to Admission medications   Medication Sig Start Date End Date Taking? Authorizing Provider  acetaminophen  (TYLENOL ) 500 MG tablet Take 1,000 mg by mouth every 6 (six) hours as needed for mild pain.   Yes [provider]  allopurinol  (ZYLOPRIM ) 100 MG tablet TAKE 1 TABLET (100 MG TOTAL) BY MOUTH DAILY. FOR GOUT PREVENTION 04/11/24  Yes Clark, Katherine K, NP  busPIRone  (BUSPAR ) 5 MG tablet TAKE 1 TABLET BY MOUTH TWICE  A DAY FOR ANXIETY 08/26/24  Yes Clark, Katherine K, NP  dolutegravir  ([name] ) 50 MG tablet Take 1 tablet (50 mg total) by mouth daily. 05/13/24  Yes Melvenia Corean SAILOR, NP  emtricitabine -tenofovir  AF (DESCOVY ) 200-25 MG tablet Take 1 tablet by mouth daily. 05/13/24  Yes Melvenia Corean SAILOR, NP  fluticasone  (FLOVENT  HFA) 110 MCG/ACT inhaler Inhale 1 puff into the lungs 2 (two) times daily.   Yes [provider]  levocetirizine (XYZAL ) 5 MG tablet TAKE 1 TABLET BY MOUTH EVERY DAY IN THE EVENING FOR ALLERGIES 05/12/24  Yes Clark, Katherine K, NP  methocarbamol  (ROBAXIN ) 500 MG tablet Take 1 tablet (500 mg total) by mouth every 6 (six) hours as needed for muscle spasms. 08/30/24  Yes Janjua, Rashid M, MD  minocycline  (MINOCIN ) 100 MG capsule Take 1 capsule (100 mg total) by mouth 2 (two) times daily. 03/13/24  Yes Melvenia Corean SAILOR, NP  nystatin  (MYCOSTATIN ) 100000 UNIT/ML suspension Take 5 mLs by mouth 4 (four) times daily. 09/05/24  Yes [provider]  omeprazole  (PRILOSEC) 40 MG capsule Take 1 capsule (40 mg total) by mouth daily. for heartburn. 04/04/24  Yes Clark, Katherine K, NP  ondansetron  (ZOFRAN -ODT) 4 MG disintegrating tablet Take 1 tablet (4 mg total) by mouth every 8 (eight) hours as needed for nausea or vomiting. 08/28/24  Yes Gretta Comer POUR, NP  rosuvastatin  (CRESTOR ) 10 MG tablet TAKE 1 TABLET BY MOUTH EVERY DAY FOR CHOLESTEROL 03/21/24  Yes Clark, Katherine K, NP  saxagliptin  HCl (ONGLYZA) 2.5 MG TABS tablet TAKE 1 TABLET (2.5 MG TOTAL) BY MOUTH DAILY. FOR DIABETES. 07/20/24  Yes Clark, Katherine K, NP  sevelamer  carbonate (RENVELA ) 800 MG tablet Take 800 mg by mouth 3 (three) times daily with meals. 08/07/24  Yes [provider]  SYNTHROID  150 MCG tablet TAKE 1 TABLET BY MOUTH DAILY BEFORE BREAKFAST. 03/12/24  Yes Trixie File, MD  albuterol  (VENTOLIN  HFA) 108 (90 Base) MCG/ACT inhaler INHALE 2 PUFFS INTO THE LUNGS UP TO EVERY 6HRS AS NEEDED FOR  WHEEZING/SHORTNESS OF BREATH Patient not taking: Reported on 09/07/2024 09/06/23   Gretta Comer POUR, NP  AURYXIA  1 GM 210 MG(Fe) tablet Take 210 mg by mouth 3 (three) times daily with meals. Patient not taking: Reported on 09/07/2024 04/05/23   [provider]  calcitRIOL (ROCALTROL) 0.25 MCG capsule Take 0.25 mcg by mouth 3 (three) times a week. Patient not taking: Reported on 09/07/2024 08/07/24   [provider]  gabapentin  (NEURONTIN ) 100 MG capsule Take  1 capsule (100 mg total) by mouth 3 (three) times daily. Patient not taking: Reported on 09/07/2024 08/15/24   Lanis Fonda BRAVO, MD  HYDROcodone -acetaminophen  (NORCO/VICODIN) 5-325 MG tablet Take 1 tablet by mouth every 6 (six) hours as needed. Patient not taking: Reported on 08/28/2024 08/04/24   Arlice Reichert, MD  Lancets [facility] ULTRASOFT) lancets Use as instructed to test blood sugar daily 02/15/24   Clark, Katherine K, NP  lidocaine  (LIDODERM ) 5 % PLACE 1 PATCH ONTO THE SKIN DAILY. REMOVE & DISCARD PATCH WITHIN 12 HOURS OR AS DIRECTED BY MD Patient not taking: Reported on 08/28/2024 04/21/24   Clark, Katherine K, NP  losartan  (COZAAR ) 100 MG tablet Take 100 mg by mouth daily. Patient not taking: Reported on 08/28/2024 08/06/24   [provider]  losartan  (COZAAR ) 50 MG tablet Take 50 mg by mouth daily. Patient not taking: Reported on 08/28/2024 08/10/24   [provider]    Current Facility-Administered Medications  Medication Dose Route Frequency Provider Last Rate Last Admin   albuterol  (PROVENTIL ) (2.5 MG/3ML) 0.083% nebulizer solution 3 mL  3 mL Inhalation Q6H PRN Alfornia [name], MD       allopurinol  (ZYLOPRIM ) tablet 100 mg  100 mg Oral Daily Rathore, Vasundhra, MD   100 mg at 09/10/24 1024   budesonide  (PULMICORT ) nebulizer solution 0.25 mg  0.25 mg Nebulization BID Gherghe, Costin M, MD   0.25 mg at 09/09/24 1954   busPIRone  (BUSPAR ) tablet 5 mg  5 mg Oral BID Rathore, Vasundhra, MD   5  mg at 09/10/24 1024   dialysis solution 1.5% low-MG/low-CA dianeal  solution   Intraperitoneal Q24H Collins, Samantha G, PA-C       dolutegravir  ([name] ) tablet 50 mg  50 mg Oral Daily Rathore, Vasundhra, MD   50 mg at 09/10/24 1023   emtricitabine -tenofovir  AF (DESCOVY ) 200-25 MG per tablet 1 tablet  1 tablet Oral Daily Rathore, Vasundhra, MD   1 tablet at 09/10/24 1023   gentamicin  cream (GARAMYCIN ) 0.1 % 1 Application  1 Application Topical Daily Geralynn Charleston, MD   1 Application at 09/09/24 1158   heparin  sodium (porcine) 3,000 Units in dialysis solution 1.5% low-MG/low-CA 6,000 mL dialysis solution   Peritoneal Dialysis PRN Gherghe, Costin M, MD       HYDROcodone -acetaminophen  (NORCO/VICODIN) 5-325 MG per tablet 1 tablet  1 tablet Oral Q6H PRN Rathore, Vasundhra, MD   1 tablet at 09/09/24 1555   hydrOXYzine  (ATARAX ) tablet 25 mg  25 mg Oral TID PRN Raenelle Donalda HERO, MD   25 mg at 09/10/24 1023   levothyroxine  (SYNTHROID ) tablet 150 mcg  150 mcg Oral QAC breakfast Alfornia [name], MD   150 mcg at 09/10/24 0452   lidocaine  (LIDODERM ) 5 % 1 patch  1 patch Transdermal Q24H Rathore, Vasundhra, MD   1 patch at 09/08/24 0531   loratadine  (CLARITIN ) tablet 10 mg  10 mg Oral QPM Rathore, Vasundhra, MD   10 mg at 09/09/24 1837   methocarbamol  (ROBAXIN ) tablet 500 mg  500 mg Oral Q6H PRN Rathore, Vasundhra, MD   500 mg at 09/10/24 1024   naloxone  (NARCAN ) injection 0.4 mg  0.4 mg Intravenous PRN Alfornia [name], MD       nystatin  (MYCOSTATIN ) 100000 UNIT/ML suspension 500,000 Units  5 mL Oral QID Rathore, Vasundhra, MD   500,000 Units at 09/10/24 1024   ondansetron  (ZOFRAN ) injection 4 mg  4 mg Intravenous Q6H PRN Raenelle Donalda HERO, MD   4 mg at 09/09/24 8161   pantoprazole  (PROTONIX )  EC tablet 40 mg  40 mg Oral BID AC Ghimire, Donalda HERO, MD   40 mg at 09/10/24 1023   rosuvastatin  (CRESTOR ) tablet 10 mg  10 mg Oral Daily Alfornia [name], MD   10 mg at 09/10/24 1024   sevelamer  carbonate  (RENVELA ) tablet 800 mg  800 mg Oral TID WC Alfornia [name], MD   800 mg at 09/09/24 1555   vancomycin  (VANCOCIN ) capsule 125 mg  125 mg Oral QID Gherghe, Costin M, MD   125 mg at 09/10/24 1023    Allergies as of 09/07/2024 - Review Complete 09/07/2024  Allergen Reaction Noted   Genvoya [elviteg-cobic-emtricit-tenofaf] Hives 06/09/2015   Lisinopril Cough 02/14/2011   Aldactone  [spironolactone ] Hives 01/29/2023   Valium  [diazepam ] Other (See Comments) 08/01/2024   Tegaderm ag mesh [silver] Itching 10/23/2021    Social History   Socioeconomic History   Marital status: Single    Spouse name: Not on file   Number of children: 1   Years of education: Not on file   Highest education level: Not on file  Occupational History   Occupation: Investment Banker, Corporate  Tobacco Use   Smoking status: Former    Current packs/day: 0.00    Average packs/day: 0.5 packs/day for 15.0 years (7.5 ttl pk-yrs)    Types: Cigarettes    Start date: 04/12/2017    Quit date: 2019    Years since quitting: 6.9   Smokeless tobacco: Never  Vaping Use   Vaping status: Never Used  Substance and Sexual Activity   Alcohol  use: Not Currently    Comment: occasional wine   Drug use: No   Sexual activity: Not Currently    Birth control/protection: Post-menopausal  Other Topics Concern   Not on file  Social History Narrative   Right Handed    Lives in a two story home   Social Drivers of Health   Financial Resource Strain: Medium Risk (06/27/2024)   Overall Financial Resource Strain (CARDIA)    Difficulty of Paying Living Expenses: Somewhat hard  Food Insecurity: No Food Insecurity (09/08/2024)   Hunger Vital Sign    Worried About Running Out of Food in the Last Year: Never true    Ran Out of Food in the Last Year: Never true  Recent Concern: Food Insecurity - Food Insecurity Present (06/27/2024)   Hunger Vital Sign    Worried About Running Out of Food in the Last Year: Sometimes true    Ran Out of Food in  the Last Year: Sometimes true  Transportation Needs: No Transportation Needs (09/08/2024)   PRAPARE - Administrator, Civil Service (Medical): No    Lack of Transportation (Non-Medical): No  Physical Activity: Inactive (06/27/2024)   Exercise Vital Sign    Days of Exercise per Week: 0 days    Minutes of Exercise per Session: 0 min  Stress: Stress Concern Present (06/27/2024)   [employer] of Occupational Health - Occupational Stress Questionnaire    Feeling of Stress: To some extent  Social Connections: Unknown (06/27/2024)   Social Connection and Isolation Panel    Frequency of Communication with Friends and Family: More than three times a week    Frequency of Social Gatherings with Friends and Family: Three times a week    Attends Religious Services: More than 4 times per year    Active Member of Clubs or Organizations: No    Attends Banker Meetings: Never    Marital Status: Not on file  Intimate Partner  Violence: Not At Risk (09/08/2024)   Humiliation, Afraid, Rape, and Kick questionnaire    Fear of Current or Ex-Partner: No    Emotionally Abused: No    Physically Abused: No    Sexually Abused: No     Code Status   Code Status: Full Code  Review of Systems: All systems reviewed and negative except where noted in HPI.  Physical Exam: Vital signs in last 24 hours: Temp:  [97.5 F (36.4 C)-98.6 F (37 C)] 98.2 F (36.8 C) (11/25 0818) Pulse Rate:  [86-100] 94 (11/25 0315) Resp:  [14-20] 14 (11/25 0315) BP: (109-136)/(51-74) 109/66 (11/25 0818) SpO2:  [92 %-98 %] 94 % (11/25 0315) Last BM Date : 09/08/24  General:  Pleasant female in NAD Psych:  Cooperative. Normal mood and affect Eyes: Pupils equal Ears:  Normal auditory acuity Nose: No deformity, discharge or lesions Mouth: No obvious Candida Neck:  Supple, no masses felt Lungs:  Clear to auscultation.  Heart:  Regular rate, regular rhythm.  Abdomen:  Soft, nondistended, nontender,  active bowel sounds, no masses felt.  PD catheter covered with clean bandage Rectal :  Deferred Msk: Symmetrical without gross deformities.  Neurologic:  Alert, oriented, grossly normal neurologically Extremities : No edema Skin:  Intact without significant lesions.    Intake/Output from previous day: No intake/output data recorded. Intake/Output this shift:  Total I/O In: -625  Out: -    Vina Dasen, NP-C   09/10/2024, 11:28 AM  GI ATTENDING  History, laboratories, x-rays all personally reviewed.  Patient seen and examined as outlined above.  Agree with comprehensive consultation note as outlined above.  Patient has multiple significant medical problems as noted.  We are asked to see regarding abnormal esophagram and dysphagia.  Most likely has dysmotility.  We will plan upper endoscopy with possible esophageal dilation to address dysphagia and complaints of upper abdominal pain.  Otherwise, primary service to treat concomitant medical problems.  The patient is high risk for procedures given her comorbidities.  The entirety of this evaluation myself in the advanced practitioner equal to 80 minutes.  Level of medical decision making is deemed high.  Norleen SAILOR. Abran Raddle., M.D. [facility] Division of Gastroenterology

## 2024-09-10 NOTE — Plan of Care (Signed)

## 2024-09-11 ENCOUNTER — Encounter (HOSPITAL_COMMUNITY): Payer: Self-pay | Admitting: Internal Medicine

## 2024-09-11 ENCOUNTER — Inpatient Hospital Stay (HOSPITAL_COMMUNITY): Admitting: Certified Registered"

## 2024-09-11 ENCOUNTER — Encounter (HOSPITAL_COMMUNITY)
Admission: EM | Disposition: A | Discharge status: Home Health | Payer: Self-pay | Source: Home / Self Care | Attending: Internal Medicine

## 2024-09-11 ENCOUNTER — Other Ambulatory Visit: Payer: Self-pay | Admitting: Infectious Diseases

## 2024-09-11 ENCOUNTER — Other Ambulatory Visit (HOSPITAL_COMMUNITY): Payer: Self-pay

## 2024-09-11 DIAGNOSIS — I10 Essential (primary) hypertension: Secondary | ICD-10-CM | POA: Diagnosis not present

## 2024-09-11 DIAGNOSIS — Z87891 Personal history of nicotine dependence: Secondary | ICD-10-CM | POA: Diagnosis not present

## 2024-09-11 DIAGNOSIS — R1310 Dysphagia, unspecified: Secondary | ICD-10-CM | POA: Diagnosis not present

## 2024-09-11 DIAGNOSIS — K659 Peritonitis, unspecified: Secondary | ICD-10-CM | POA: Diagnosis not present

## 2024-09-11 DIAGNOSIS — F418 Other specified anxiety disorders: Secondary | ICD-10-CM

## 2024-09-11 DIAGNOSIS — K2950 Unspecified chronic gastritis without bleeding: Secondary | ICD-10-CM

## 2024-09-11 DIAGNOSIS — K2970 Gastritis, unspecified, without bleeding: Secondary | ICD-10-CM

## 2024-09-11 DIAGNOSIS — E1169 Type 2 diabetes mellitus with other specified complication: Secondary | ICD-10-CM | POA: Diagnosis not present

## 2024-09-11 DIAGNOSIS — N186 End stage renal disease: Secondary | ICD-10-CM | POA: Diagnosis not present

## 2024-09-11 DIAGNOSIS — Z21 Asymptomatic human immunodeficiency virus [HIV] infection status: Secondary | ICD-10-CM | POA: Diagnosis not present

## 2024-09-11 HISTORY — PX: ESOPHAGOGASTRODUODENOSCOPY: SHX5428

## 2024-09-11 LAB — GLUCOSE, CAPILLARY
Glucose-Capillary: 113 mg/dL — ABNORMAL HIGH (ref 70–99)
Glucose-Capillary: 96 mg/dL (ref 70–99)
Glucose-Capillary: 69 mg/dL — ABNORMAL LOW (ref 70–99)
Glucose-Capillary: 72 mg/dL (ref 70–99)
Glucose-Capillary: 67 mg/dL — ABNORMAL LOW (ref 70–99)
Glucose-Capillary: 84 mg/dL (ref 70–99)
Glucose-Capillary: 133 mg/dL — ABNORMAL HIGH (ref 70–99)

## 2024-09-11 LAB — BODY FLUID CULTURE W GRAM STAIN
Culture: NO GROWTH
Gram Stain: NONE SEEN

## 2024-09-11 SURGERY — EGD (ESOPHAGOGASTRODUODENOSCOPY)
Anesthesia: Monitor Anesthesia Care

## 2024-09-11 MED ORDER — LIDOCAINE 2% (20 MG/ML) 5 ML SYRINGE
INTRAMUSCULAR | Status: DC | PRN
  Administered 2024-09-11: 60 mg via INTRAVENOUS
  Administered 2024-09-11: 50 mg via INTRAVENOUS

## 2024-09-11 MED ORDER — VANCOMYCIN HCL 125 MG PO CAPS
125.0000 mg | ORAL_CAPSULE | Freq: Four times a day (QID) | ORAL | 0 refills | Status: AC
  Filled 2024-09-11: qty 20, 5d supply, fill #0

## 2024-09-11 MED ORDER — PROPOFOL 500 MG/50ML IV EMUL
INTRAVENOUS | Status: DC | PRN
  Administered 2024-09-11: 200 ug/kg/min via INTRAVENOUS

## 2024-09-11 MED ORDER — PHENYLEPHRINE 80 MCG/ML (10ML) SYRINGE FOR IV PUSH (FOR BLOOD PRESSURE SUPPORT)
PREFILLED_SYRINGE | INTRAVENOUS | Status: DC | PRN
  Administered 2024-09-11: 80 ug via INTRAVENOUS
  Administered 2024-09-11: 160 ug via INTRAVENOUS
  Administered 2024-09-11 (×3): 80 ug via INTRAVENOUS

## 2024-09-11 MED ORDER — GLYCOPYRROLATE PF 0.2 MG/ML IJ SOSY
PREFILLED_SYRINGE | INTRAMUSCULAR | Status: DC | PRN
  Administered 2024-09-11: .2 mg via INTRAVENOUS

## 2024-09-11 MED ORDER — SODIUM CHLORIDE 0.9 % IV SOLN: INTRAVENOUS | Status: DC

## 2024-09-11 MED ORDER — PROPOFOL 10 MG/ML IV BOLUS
INTRAVENOUS | Status: DC | PRN
  Administered 2024-09-11: 50 mg via INTRAVENOUS
  Administered 2024-09-11: 80 mg via INTRAVENOUS

## 2024-09-11 NOTE — Op Note (Signed)
 [facility] Patient Name: [name] Procedure Date : 09/11/2024 MRN: [id number] Attending MD: Norleen SAILOR. Abran , MD, [id number] Date of Birth: Aug 27, 1966 CSN: [id number] Age: 58 Admit Type: Inpatient Procedure:                Upper GI endoscopy with balloon dilation of the                            esophagus. 20 mm. BX Indications:              Dysphagia, Abnormal cine-esophagram Providers:                Norleen SAILOR. Abran, MD, Hoy Penner, RN, Fairy Marina, Technician Referring MD:             [facility] hospitalist Medicines:                Monitored Anesthesia Care Complications:            No immediate complications. Estimated Blood Loss:     Estimated blood loss: none. Procedure:                Pre-Anesthesia Assessment:                           - Prior to the procedure, a History and Physical                            was performed, and patient medications and                            allergies were reviewed. The patient's tolerance of                            previous anesthesia was also reviewed. The risks                            and benefits of the procedure and the sedation                            options and risks were discussed with the patient.                            All questions were answered, and informed consent                            was obtained. Prior Anticoagulants: The patient has                            taken no anticoagulant or antiplatelet agents. ASA                            Grade Assessment: III - A patient with severe  systemic disease. After reviewing the risks and                            benefits, the patient was deemed in satisfactory                            condition to undergo the procedure.                           After obtaining informed consent, the endoscope was                            passed under direct vision. Throughout the                             procedure, the patient's blood pressure, pulse, and                            oxygen  saturations were monitored continuously. The                            [id number]-[id number] ([id number]) Olympus endoscope was introduced                            through the mouth, and advanced to the second part                            of duodenum. The upper GI endoscopy was                            accomplished without difficulty. The patient                            tolerated the procedure well. Scope In: Scope Out: Findings:      The examined esophagus was normal. No obvious stricture. Empiric       dilation throughout the esophagus performed. A TTS dilator was passed       through the scope. Dilation with an 18-19-20 mm balloon dilator was       performed to 20 mm.      The entire examined stomach was normal except for mild antral erythema.       Biopsies were taken with a cold forceps for histology.      The examined duodenum was normal. The stomach was normal on retroflexed       view Impression:               - Normal esophagus. Dilated.                           - Normal stomach save mild antral erythema.                            Biopsied.                           - Normal examined duodenum. Normal  retroflexed view Recommendation:           1. Resume previous diet                           2. Continue PPI                           3. Await pathology results.                           4. Resume care with primary service                           The procedure findings and maneuvers were discussed                            with the patient. She was provided a copy of this                            report. GI will sign off. Outpatient GI follow-up                            as needed with her primary gastroenterologist (Dr.                            Leigh) Procedure Code(s):        --- Professional ---                           [phone], Esophagogastroduodenoscopy, flexible,                             transoral; with transendoscopic balloon dilation of                            esophagus (less than 30 mm diameter)                           43239, 59, Esophagogastroduodenoscopy, flexible,                            transoral; with biopsy, single or multiple Diagnosis Code(s):        --- Professional ---                           R13.10, Dysphagia, unspecified                           R93.3, Abnormal findings on diagnostic imaging of                            other parts of digestive tract CPT copyright 2022 American Medical Association. All rights reserved. The codes documented in this report are preliminary and upon coder review may  be revised to meet current compliance requirements. Norleen SAILOR. Abran, MD 09/11/2024 1:08:22 PM This report has been signed electronically. Number of Addenda: 0

## 2024-09-11 NOTE — Interval H&P Note (Signed)
 History and Physical Interval Note:  09/11/2024 11:51 AM  [name]  has presented today for surgery, with the diagnosis of dysphagia, abnormal barium swallow, upper abdominal pain.  The various methods of treatment have been discussed with the patient and family. After consideration of risks, benefits and other options for treatment, the patient has consented to  Procedure(s): EGD (ESOPHAGOGASTRODUODENOSCOPY) (N/A) as a surgical intervention.  The patient's history has been reviewed, patient examined, no change in status, stable for surgery.  I have reviewed the patient's chart and labs.  Questions were answered to the patient's satisfaction.     Norleen Kiang

## 2024-09-11 NOTE — Anesthesia Postprocedure Evaluation (Signed)
 Anesthesia Post Note  Patient: [name]  Procedure(s) Performed: EGD (ESOPHAGOGASTRODUODENOSCOPY)     Patient location during evaluation: Endoscopy Anesthesia Type: MAC Level of consciousness: awake and alert Pain management: pain level controlled Vital Signs Assessment: post-procedure vital signs reviewed and stable Respiratory status: spontaneous breathing, nonlabored ventilation, respiratory function stable and patient connected to nasal cannula oxygen  Cardiovascular status: stable and blood pressure returned to baseline Postop Assessment: no apparent nausea or vomiting Anesthetic complications: no   No notable events documented.  Last Vitals:  Vitals:   09/11/24 1310 09/11/24 1320  BP: (!) 98/51 (!) 106/57  Pulse: 90 91  Resp: 17 16  Temp:    SpO2: 97% 96%    Last Pain:  Vitals:   09/11/24 1320  TempSrc:   PainSc: 0-No pain                 [name]

## 2024-09-11 NOTE — Progress Notes (Signed)
 Discharge medications received from off going nurse. Placed in patient specific bin in Pyxis for safety and security.

## 2024-09-11 NOTE — Progress Notes (Signed)
 PROGRESS NOTE        PATIENT DETAILS Name: [name] Age: 58 y.o. Sex: female Date of Birth: 11-07-1965 Admit Date: 09/07/2024 Admitting Physician Editha Ram, MD ERE:Rojmx, Comer POUR, NP  Brief Summary: Patient is a 58 y.o.  female ESRD on PD, HIV, recent history of pituitary apoplexy, HTN, papillary thyroid  carcinoma-s/p thyroidectomy, sarcoidosis-who presented with abdominal pain, nausea, vomiting and diarrhea.  Initially-there was concern for PD catheter associated peritonitis-however workup was not consistent with peritonitis, subsequent stools since studies were obtained-which were suggestive of C. difficile colitis (recent antibiotic exposure apparently 3 weeks back)-and started on oral vancomycin .  See below for further details.  Significant events: 11/23>> admit to [facility].  Significant studies: 11/22>> CXR: No PNA 11/22>> CT abdomen: Stent graft repair of aortic dissection with continued dissection throughout the abdominal aorta-stable since prior study.  Small amount of ascites. 11/23>> peritoneal fluid: WBC 61 11/24>> barium esophagogram: Dysmotility-delayed passage of 13 mm barium tablet likely secondary to dysmotility.  Significant microbiology data: 11/22>> COVID/influenza/RSV PCR: Negative 11/23>> GI pathogen panel: Negative 11/23>> stool C. difficile antigen positive, toxin negative. 11/23>> peritoneal fluid culture: No growth  Procedures: None  Consults: Nephrology GI  Subjective: No diarrhea-n.p.o. this morning for EGD later today.  No complaints.  Lying comfortably in bed.  Objective: Vitals: Blood pressure (!) 106/55, pulse (!) 104, temperature 98.4 F (36.9 C), temperature source Oral, resp. rate 14, height 5' 5 (1.651 m), weight 80.7 kg, last menstrual period 03/31/2014, SpO2 96%.   Exam: Gen Exam:Alert awake-not in any distress HEENT:atraumatic, normocephalic Chest: B/L clear to auscultation  anteriorly CVS:S1S2 regular Abdomen:soft non tender, non distended Extremities:no edema Neurology: Non focal Skin: no rash  Pertinent Labs/Radiology:    Latest Ref Rng & Units 09/09/2024    3:58 AM 09/08/2024    5:27 AM 09/07/2024    7:22 PM  CBC  WBC 4.0 - 10.5 K/uL 9.4  9.8  10.5   Hemoglobin 12.0 - 15.0 g/dL 89.0  89.4  87.8   Hematocrit 36.0 - 46.0 % 32.2  31.0  37.2   Platelets 150 - 400 K/uL 173  177  207     Lab Results  Component Value Date   NA 136 09/10/2024   K 3.5 09/10/2024   CL 92 (L) 09/10/2024   CO2 25 09/10/2024      Assessment/Plan: Presumed C. difficile colitis Although only stool C. difficile antigen but with Antibiotic exposure for UTI approximately 3 weeks back (took for 1 week)-diarrhea has improved after starting oral vancomycin . Will plan on at least 10 days of oral vancomycin . Attempt to minimize antibiotic exposure in the future.  Solid/liquid food dysphagia Ongoing x 3 weeks Has history of GERD-already on PPI-dosage switch to twice daily dosing Barium esophagram as above-GI consulted-EGD scheduled for later today.    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 Rosslyn Repeat MRI scheduled for December Per chart review-cortisol/LH/FSH/ACTH  levels were all within normal limits Currently no signs of adrenal crisis-continue to monitor closely and have patient follow-up with neurosurgery as planned.  HIV (CD4 445 on 10/15) Continue ART  History of papillary thyroid  carcinoma-s/p thyroidectomy Synthroid -TSH stable  DM-2 (A1c 6.1) No longer SSI-CBG stable  Recent Labs    09/11/24 0420 09/11/24 0825 09/11/24 1135  GLUCAP 113* 96 69*  ESRD on PD Nephrology following  Normocytic anemia Secondary to ESRD-defer Aranesp /iron  therapies to nephrology service  Hypokalemia Secondary diarrhea Repleted.  History of aortic dissection-s/p stent graft November 2024 CT  abdomen-stable-unchanged from prior Supportive care. Outpatient follow-up with vascular/cardiothoracic surgery.  Gout Allopurinol   Chronic low back pain Unchanged-stable Continue as needed narcotics/Robaxin .  Anxiety/depression Stable BuSpar   Code status:   Code Status: Full Code   DVT Prophylaxis: SCDs Start: 09/08/24 0429   Family Communication: None at bedside   Disposition Plan: Status is: Inpatient Remains inpatient appropriate because: Severity of illness   Planned Discharge Destination:Home   Diet: Diet Order             Diet NPO time specified Except for: Sips with Meds  Diet effective midnight                     Antimicrobial agents: Anti-infectives (From admission, onward)    Start     Dose/Rate Route Frequency Ordered Stop   09/08/24 1400  [MAR Hold]  vancomycin  (VANCOCIN ) capsule 125 mg        (MAR Hold since Wed 09/11/2024 at 1124.Hold Reason: Transfer to a Procedural area)   125 mg Oral 4 times daily 09/08/24 1333 09/18/24 1359   09/08/24 1000  [MAR Hold]  dolutegravir  ([name] ) tablet 50 mg        (MAR Hold since Wed 09/11/2024 at 1124.Hold Reason: Transfer to a Procedural area)   50 mg Oral Daily 09/08/24 0427     09/08/24 1000  [MAR Hold]  emtricitabine -tenofovir  AF (DESCOVY ) 200-25 MG per tablet 1 tablet        (MAR Hold since Wed 09/11/2024 at 1124.Hold Reason: Transfer to a Procedural area)   1 tablet Oral Daily 09/08/24 0427     09/08/24 0600  vancomycin  (VANCOREADY) IVPB 2000 mg/400 mL        2,000 mg 200 mL/hr over 120 Minutes Intravenous  Once 09/08/24 0433 09/08/24 0753   09/08/24 0600  piperacillin -tazobactam (ZOSYN ) IVPB 2.25 g  Status:  Discontinued        2.25 g 100 mL/hr over 30 Minutes Intravenous Every 8 hours 09/08/24 0435 09/08/24 1518   09/08/24 0514  vancomycin  variable dose per unstable renal function (pharmacist dosing)  Status:  Discontinued         Does not apply See admin instructions 09/08/24 0514 09/09/24 0859    09/07/24 2330  piperacillin -tazobactam (ZOSYN ) IVPB 3.375 g        3.375 g 100 mL/hr over 30 Minutes Intravenous  Once 09/07/24 2323 09/08/24 0337        MEDICATIONS: Scheduled Meds:  [MAR Hold] allopurinol   100 mg Oral Daily   [MAR Hold] budesonide  (PULMICORT ) nebulizer solution  0.25 mg Nebulization BID   [MAR Hold] busPIRone   5 mg Oral BID   [MAR Hold] dolutegravir   50 mg Oral Daily   [MAR Hold] emtricitabine -tenofovir  AF  1 tablet Oral Daily   [MAR Hold] gentamicin  cream  1 Application Topical Daily   [MAR Hold] levothyroxine   150 mcg Oral QAC breakfast   [MAR Hold] lidocaine   1 patch Transdermal Q24H   [MAR Hold] loratadine   10 mg Oral QPM   [MAR Hold] nystatin   5 mL Oral QID   [MAR Hold] pantoprazole   40 mg Oral BID AC   [MAR Hold] rosuvastatin   10 mg Oral Daily   [MAR Hold] sevelamer  carbonate  800 mg Oral TID WC   [MAR Hold] vancomycin   125 mg Oral QID  Continuous Infusions:  sodium chloride  20 mL/hr at 09/11/24 1142   [MAR Hold] dialysis solution 1.5% low-MG/low-CA     PRN Meds:.[MAR Hold] albuterol , [MAR Hold] heparin  sodium (porcine) 3,000 Units in dialysis solution 1.5% low-MG/low-CA 6,000 mL dialysis solution, [MAR Hold] HYDROcodone -acetaminophen , [MAR Hold] hydrOXYzine , [MAR Hold] methocarbamol , [MAR Hold] naLOXone  (NARCAN )  injection, [MAR Hold] ondansetron  (ZOFRAN ) IV   I have personally reviewed following labs and imaging studies  LABORATORY DATA: CBC: Recent Labs  Lab 09/07/24 1922 09/08/24 0527 09/09/24 0358  WBC 10.5 9.8 9.4  HGB 12.1 10.5* 10.9*  HCT 37.2 31.0* 32.2*  MCV 110.7* 108.4* 108.4*  PLT 207 177 173    Basic Metabolic Panel: Recent Labs  Lab 09/07/24 1922 09/08/24 0527 09/09/24 0358 09/10/24 0246  NA 135 137 134* 136  K 3.8 3.7 3.2* 3.5  CL 91* 95* 91* 92*  CO2 22 24 24 25   GLUCOSE 95 68* 77 148*  BUN 37* 40* 38* 39*  CREATININE 15.35* 15.88* 14.73* 14.70*  CALCIUM  8.8* 8.3* 8.2* 8.4*  MG  --   --  1.7  --   PHOS  --    --  7.0* 6.2*    GFR: Estimated Creatinine Clearance: 4.5 mL/min (A) (by C-G formula based on SCr of 14.7 mg/dL (H)).  Liver Function Tests: Recent Labs  Lab 09/07/24 1922 09/09/24 0358 09/10/24 0246  AST 28 22  --   ALT 13 11  --   ALKPHOS 228* 212*  --   BILITOT 1.0 1.0  --   PROT 7.3 6.5  --   ALBUMIN  2.3* 1.9* 1.9*   Recent Labs  Lab 09/07/24 1922  LIPASE 20   No results for input(s): AMMONIA in the last 168 hours.  Coagulation Profile: No results for input(s): INR, PROTIME in the last 168 hours.  Cardiac Enzymes: No results for input(s): CKTOTAL, CKMB, CKMBINDEX, TROPONINI in the last 168 hours.  BNP (last 3 results) No results for input(s): PROBNP in the last 8760 hours.  Lipid Profile: No results for input(s): CHOL, HDL, LDLCALC, TRIG, CHOLHDL, LDLDIRECT in the last 72 hours.  Thyroid  Function Tests: No results for input(s): TSH, T4TOTAL, FREET4, T3FREE, THYROIDAB in the last 72 hours.   Anemia Panel: No results for input(s): VITAMINB12, FOLATE, FERRITIN, TIBC, IRON , RETICCTPCT in the last 72 hours.  Urine analysis:    Component Value Date/Time   COLORURINE YELLOW 07/18/2024 2142   APPEARANCEUR HAZY (A) 07/18/2024 2142   LABSPEC 1.025 07/18/2024 2142   PHURINE 5.0 07/18/2024 2142   GLUCOSEU NEGATIVE 07/18/2024 2142   GLUCOSEU NEG mg/dL 95/91/7990 8581   HGBUR NEGATIVE 07/18/2024 2142   BILIRUBINUR SMALL (A) 07/18/2024 2142   BILIRUBINUR neg 10/19/2022 1340   KETONESUR NEGATIVE 07/18/2024 2142   PROTEINUR 30 (A) 07/18/2024 2142   UROBILINOGEN 0.2 10/19/2022 1340   UROBILINOGEN 0.2 10/21/2014 2347   NITRITE NEGATIVE 07/18/2024 2142   LEUKOCYTESUR MODERATE (A) 07/18/2024 2142    Sepsis Labs: Lactic Acid, Venous    Component Value Date/Time   LATICACIDVEN 1.2 03/20/2024 1717    MICROBIOLOGY: Recent Results (from the past 240 hours)  Resp panel by RT-PCR (RSV, Flu A&B, Covid) Anterior Nasal  Swab     Status: None   Collection Time: 09/07/24  7:57 PM   Specimen: Anterior Nasal Swab  Result Value Ref Range Status   SARS Coronavirus 2 by RT PCR NEGATIVE NEGATIVE Final   Influenza A by PCR NEGATIVE NEGATIVE Final   Influenza B by PCR NEGATIVE NEGATIVE  Final    Comment: (NOTE) The Xpert Xpress SARS-CoV-2/FLU/RSV plus assay is intended as an aid in the diagnosis of influenza from Nasopharyngeal swab specimens and should not be used as a sole basis for treatment. Nasal washings and aspirates are unacceptable for Xpert Xpress SARS-CoV-2/FLU/RSV testing.  Fact Sheet for Patients: bloggercourse.com  Fact Sheet for Healthcare Providers: seriousbroker.it  This test is not yet approved or cleared by the [place]  FDA and has been authorized for detection and/or diagnosis of SARS-CoV-2 by FDA under an Emergency Use Authorization (EUA). This EUA will remain in effect (meaning this test can be used) for the duration of the COVID-19 declaration under Section 564(b)(1) of the Act, 21 U.S.C. section 360bbb-3(b)(1), unless the authorization is terminated or revoked.     Resp Syncytial Virus by PCR NEGATIVE NEGATIVE Final    Comment: (NOTE) Fact Sheet for Patients: bloggercourse.com  Fact Sheet for Healthcare Providers: seriousbroker.it  This test is not yet approved or cleared by the [place]  FDA and has been authorized for detection and/or diagnosis of SARS-CoV-2 by FDA under an Emergency Use Authorization (EUA). This EUA will remain in effect (meaning this test can be used) for the duration of the COVID-19 declaration under Section 564(b)(1) of the Act, 21 U.S.C. section 360bbb-3(b)(1), unless the authorization is terminated or revoked.  Performed at [facility], [address] [place], [place] [address]   C Difficile Quick Screen w PCR reflex     Status:  Abnormal   Collection Time: 09/08/24  4:30 AM   Specimen: STOOL  Result Value Ref Range Status   C Diff antigen POSITIVE (A) NEGATIVE Final   C Diff toxin NEGATIVE NEGATIVE Final   C Diff interpretation Results are indeterminate. See PCR results.  Final    Comment: Performed at [facility], [address] [place], [place] [address]  Gastrointestinal Panel by PCR , Stool     Status: None   Collection Time: 09/08/24  4:30 AM   Specimen: STOOL  Result Value Ref Range Status   Campylobacter species NOT DETECTED NOT DETECTED Final   Plesimonas shigelloides NOT DETECTED NOT DETECTED Final   Salmonella species NOT DETECTED NOT DETECTED Final   Yersinia enterocolitica NOT DETECTED NOT DETECTED Final   Vibrio species NOT DETECTED NOT DETECTED Final   Vibrio cholerae NOT DETECTED NOT DETECTED Final   Enteroaggregative E coli (EAEC) NOT DETECTED NOT DETECTED Final   Enteropathogenic E coli (EPEC) NOT DETECTED NOT DETECTED Final   Enterotoxigenic E coli (ETEC) NOT DETECTED NOT DETECTED Final   Shiga like toxin producing E coli (STEC) NOT DETECTED NOT DETECTED Final   Shigella/Enteroinvasive E coli (EIEC) NOT DETECTED NOT DETECTED Final   Cryptosporidium NOT DETECTED NOT DETECTED Final   Cyclospora cayetanensis NOT DETECTED NOT DETECTED Final   Entamoeba histolytica NOT DETECTED NOT DETECTED Final   Giardia lamblia NOT DETECTED NOT DETECTED Final   Adenovirus F40/41 NOT DETECTED NOT DETECTED Final   Astrovirus NOT DETECTED NOT DETECTED Final   Norovirus GI/GII NOT DETECTED NOT DETECTED Final   Rotavirus A NOT DETECTED NOT DETECTED Final   Sapovirus (I, II, IV, and V) NOT DETECTED NOT DETECTED Final    Comment: Performed at [facility], [address] [place], [place] [address]  C. Diff by PCR, Reflexed     Status: None   Collection Time: 09/08/24  4:30 AM  Result Value Ref Range Status   Toxigenic C. Difficile by PCR NEGATIVE NEGATIVE Final  Comment: Patient is colonized  with non toxigenic C. difficile. May not need treatment unless significant symptoms are present.   Hypervirulent Strain PRESUMPTIVE NEGATIVE PRESUMPTIVE NEGATIVE Final    Comment: Performed at [facility], [address] [place], [place] [address]  Body fluid culture w Gram Stain     Status: None   Collection Time: 09/08/24 10:30 AM   Specimen: Path fluid; Body Fluid  Result Value Ref Range Status   Specimen Description FLUID  Final   Special Requests PERITONEAL  Final   Gram Stain NO WBC SEEN NO ORGANISMS SEEN   Final   Culture   Final    NO GROWTH 3 DAYS Performed at [facility], [address], [place], [place] [address]    Report Status 09/11/2024 FINAL  Final    RADIOLOGY STUDIES/RESULTS: DG ESOPHAGUS W SINGLE CM (SOL OR THIN BA) Result Date: 09/09/2024 CLINICAL DATA:  Inpatient with history of ESRD on PD, DM 2, aortic dissection status post endovascular repair, prior DVT not on anticoagulation anymore, HTN, HIV, HLD, 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 She reported dysphagia and globus sensation during meals leading to request for esophagram. EXAM: ESOPHAGUS/BARIUM SWALLOW/TABLET STUDY TECHNIQUE: Single contrast examination was performed using thin liquid barium. This exam was performed by Brittany Huneycutt, NP, and was supervised and interpreted by Dr. Rockey Kilts. FLUOROSCOPY: Radiation Exposure Index (as provided by the fluoroscopic device): 38.3 mGy Kerma COMPARISON:  None Available. FINDINGS: Swallowing: Appears normal. No vestibular penetration or aspiration seen. Pharynx: Unremarkable. Esophagus: Normal appearance. Esophageal motility: Mild esophageal dysmotility seen as tertiary contractions within the mid esophagus, best seen in series #2. Hiatal Hernia: Absent Gastroesophageal reflux: None visualized. Ingested 13 mm barium tablet: The tablet was delayed within the mid esophagus. This passed to the  distal esophagus with subsequent sips and remained at the GE junction despite providing additional fluids and applesauce Other: Radiopaque bodies consistent with above mentioned surgical history. 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 Mild delayed passage of a 13 mm barium tablet, likely secondary to dysmotility. Electronically Signed   By: Rockey Kilts M.D.   On: 09/09/2024 15:27     LOS: 3 days   Donalda Applebaum, MD  [facility]    To contact the attending provider between 7A-7P or the covering provider during after hours 7P-7A, please log into the web site [email or web address] and access using universal [place] password for that web site. If you do not have the password, please call the hospital operator.  09/11/2024, 12:29 PM

## 2024-09-11 NOTE — Care Management Important Message (Signed)
 Important Message  Patient Details  Name: [name] MRN: [id number] Date of Birth: [birth year]   Important Message Given:  Yes - Medicare IM     [name] 09/11/2024, 2:38 PM

## 2024-09-11 NOTE — Plan of Care (Signed)

## 2024-09-11 NOTE — Progress Notes (Signed)
 [facility] Progress Note   Subjective:   Reports she is getting an EGD today. Otherwise feels well, abdominal pain, N/V/D have resolved. Denies SOB.   Objective Vitals:   09/11/24 0000 09/11/24 0142 09/11/24 0402 09/11/24 0517  BP: 114/67  104/62 (!) 140/65  Pulse:  95 (!) 105 (!) 104  Resp: 16 16 16 16   Temp:   98.4 F (36.9 C)   TempSrc:   Oral   SpO2:  96% 91% 93%  Weight:      Height:       Physical Exam General: Alert female in NAD Heart: RRR, no murmurs Lungs: CTA bilaterally, respirations unlabored Abdomen: Soft, non-distended, +BS. No TTP Extremities: No edema b/l lower extremities Dialysis Access:  PD cath in R abdomen  Additional Objective Labs: Basic Metabolic Panel: Recent Labs  Lab 09/08/24 0527 09/09/24 0358 09/10/24 0246  NA 137 134* 136  K 3.7 3.2* 3.5  CL 95* 91* 92*  CO2 24 24 25   GLUCOSE 68* 77 148*  BUN 40* 38* 39*  CREATININE 15.88* 14.73* 14.70*  CALCIUM  8.3* 8.2* 8.4*  PHOS  --  7.0* 6.2*   Liver Function Tests: Recent Labs  Lab 09/07/24 1922 09/09/24 0358 09/10/24 0246  AST 28 22  --   ALT 13 11  --   ALKPHOS 228* 212*  --   BILITOT 1.0 1.0  --   PROT 7.3 6.5  --   ALBUMIN  2.3* 1.9* 1.9*   Recent Labs  Lab 09/07/24 1922  LIPASE 20   CBC: Recent Labs  Lab 09/07/24 1922 09/08/24 0527 09/09/24 0358  WBC 10.5 9.8 9.4  HGB 12.1 10.5* 10.9*  HCT 37.2 31.0* 32.2*  MCV 110.7* 108.4* 108.4*  PLT 207 177 173   Blood Culture    Component Value Date/Time   SDES FLUID 09/08/2024 1030   SPECREQUEST PERITONEAL 09/08/2024 1030   CULT  09/08/2024 1030    NO GROWTH 3 DAYS Performed at [facility], [address], [place], [place] [address]    REPTSTATUS 09/11/2024 FINAL 09/08/2024 1030    Cardiac Enzymes: No results for input(s): CKTOTAL, CKMB, CKMBINDEX, TROPONINI in the last 168 hours. CBG: Recent Labs  Lab 09/10/24 1634 09/10/24 2046 09/10/24 2353 09/11/24 0420 09/11/24 0825   GLUCAP 76 130* 112* 113* 96   Iron  Studies: No results for input(s): IRON , TIBC, TRANSFERRIN, FERRITIN in the last 72 hours. 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 She reported dysphagia and globus sensation during meals leading to request for esophagram. EXAM: ESOPHAGUS/BARIUM SWALLOW/TABLET STUDY TECHNIQUE: Single contrast examination was performed using thin liquid barium. This exam was performed by Brittany Huneycutt, NP, and was supervised and interpreted by Dr. Rockey Kilts. FLUOROSCOPY: Radiation Exposure Index (as provided by the fluoroscopic device): 38.3 mGy Kerma COMPARISON:  None Available. FINDINGS: Swallowing: Appears normal. No vestibular penetration or aspiration seen. Pharynx: Unremarkable. Esophagus: Normal appearance. Esophageal motility: Mild esophageal dysmotility seen as tertiary contractions within the mid esophagus, best seen in series #2. Hiatal Hernia: Absent Gastroesophageal reflux: None visualized. Ingested 13 mm barium tablet: The tablet was delayed within the mid esophagus. This passed to the distal esophagus with subsequent sips and remained at the GE junction despite providing additional fluids  and applesauce Other: Radiopaque bodies consistent with above mentioned surgical history. 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 Mild delayed passage of a 13 mm barium tablet, likely secondary to dysmotility. Electronically Signed   By: Rockey Kilts M.D.   On: 09/09/2024 15:27   Medications:  dialysis solution 1.5% low-MG/low-CA      allopurinol   100 mg Oral Daily   budesonide  (PULMICORT ) nebulizer solution  0.25 mg  Nebulization BID   busPIRone   5 mg Oral BID   dolutegravir   50 mg Oral Daily   emtricitabine -tenofovir  AF  1 tablet Oral Daily   gentamicin  cream  1 Application Topical Daily   levothyroxine   150 mcg Oral QAC breakfast   lidocaine   1 patch Transdermal Q24H   loratadine   10 mg Oral QPM   nystatin   5 mL Oral QID   pantoprazole   40 mg Oral BID AC   rosuvastatin   10 mg Oral Daily   sevelamer  carbonate  800 mg Oral TID WC   vancomycin   125 mg Oral QID    Dialysis Orders:  [facility] 7x week-[facility] 5 exchanges, fill vol 2250, 1.5hr dwell time 1/2 1.5% bags; 1/2 2.5% bags (usual per pt)    K+ 3.7, BUN 40, creat 15  Ca 8.3    Assessment/Plan: N/V/D/ abd pain:  Initial concern for possible PD cath related peritonitis. Empiric IV abx were started. PD fluid cell count was 61 and she does not have any abdominal pain today, culture NGTD, does not appear to be peritonitis. Reports nausea and diarrhea for several days, presumed c. Diff colitis per PMD. Improving. Planned for EGD for swallowing issue.  ESRD: on CCPD nightly. Plan for PD again today.  BP: home bp meds on hold, BP's have not been high lately and are in fact borderline low with poor PO intake lately.  Volume: no vol excess on exam. Follow.  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  Lucie Collet, PA-C 09/11/2024, 11:15 AM  [facility] Pager: [phone]

## 2024-09-11 NOTE — Anesthesia Preprocedure Evaluation (Signed)
 Anesthesia Evaluation  Patient identified by MRN, date of birth, ID band Patient awake    Reviewed: Allergy & Precautions, NPO status , Patient's Chart, lab work & pertinent test results  History of Anesthesia Complications Negative for: history of anesthetic complications  Airway Mallampati: III  TM Distance: >3 FB Neck ROM: Full    Dental  (+) Teeth Intact, Dental Advisory Given   Pulmonary neg shortness of breath, asthma , neg sleep apnea, neg COPD, neg recent URI, former smoker   breath sounds clear to auscultation       Cardiovascular hypertension, Pt. on medications + Peripheral Vascular Disease  (-) pacemaker Rhythm:Regular  1. Left ventricular ejection fraction, by estimation, is 55 to 60%. The  left ventricle has normal function. The left ventricle has no regional  wall motion abnormalities. There is mild concentric left ventricular  hypertrophy. Left ventricular diastolic  parameters are consistent with Grade II diastolic dysfunction  (pseudonormalization).   2. Right ventricular systolic function is normal. The right ventricular  size is normal. Tricuspid regurgitation signal is inadequate for assessing  PA pressure.   3. Left atrial size was mildly dilated.   4. The mitral valve is normal in structure. Mild to moderate mitral valve  regurgitation. No evidence of mitral stenosis.   5. The aortic valve is calcified. Aortic valve regurgitation is not  visualized. Aortic valve sclerosis/calcification is present, without any  evidence of aortic stenosis.   6. There appears a flap in the abdominal aorta concerning for abdominal  aortic dissection - would recommend further testing with Abdominal CTA or  MRA.   7. The inferior vena cava is dilated in size with <50% respiratory  variability, suggesting right atrial pressure of 15 mmHg.    Assessment: 1. Normal coronary arteries 2. LVEF 60-65% 3. Mild PAH likely due to  OSA/OHS 4. Otherwise normal hemodynamics 4. Severe systemic HTN       Neuro/Psych  Headaches PSYCHIATRIC DISORDERS Anxiety Depression       GI/Hepatic ,GERD  Medicated,,dysphagia, abnormal barium swallow, upper abdominal pain   Endo/Other  diabetesHypothyroidism    Renal/GU ESRF and DialysisRenal diseaseHd today     Musculoskeletal negative musculoskeletal ROS (+)    Abdominal   Peds  Hematology  (+) Blood dyscrasia, anemia Lab Results      Component                Value               Date                      WBC                      9.4                 09/09/2024                HGB                      10.9 (L)            09/09/2024                HCT                      32.2 (L)            09/09/2024  MCV                      108.4 (H)           09/09/2024                PLT                      173                 09/09/2024              Anesthesia Other Findings   Reproductive/Obstetrics                              Anesthesia Physical Anesthesia Plan  ASA: 3  Anesthesia Plan: MAC   Post-op Pain Management: Minimal or no pain anticipated   Induction: Intravenous  PONV Risk Score and Plan: 2 and Propofol  infusion and Treatment may vary due to age or medical condition  Airway Management Planned: Nasal Cannula, Natural Airway and Simple Face Mask  Additional Equipment: None  Intra-op Plan:   Post-operative Plan:   Informed Consent: I have reviewed the patients History and Physical, chart, labs and discussed the procedure including the risks, benefits and alternatives for the proposed anesthesia with the patient or authorized representative who has indicated his/her understanding and acceptance.     Dental advisory given  Plan Discussed with: CRNA  Anesthesia Plan Comments:          Anesthesia Quick Evaluation

## 2024-09-11 NOTE — Progress Notes (Signed)
 Pts PD tx initiated---no issues with the initial drain-- VS are WNL No s/s of any reddness or drainage at the exit site of the PD catheter with a sterile DSD change--gentamicin  applied

## 2024-09-11 NOTE — Progress Notes (Signed)
 [facility]  PROCEDURAL EXPEDITER PROGRESS NOTE  Patient Name: [name]  DOB:Jan 30, 1966 Date of Admission: 09/07/2024  Date of Assessment:09/11/24   -------------------------------------------------------------------------------------------------------------------   Brief clinical summary: Pt inpatient going for  endoscopy today.  Creatinine level  14.73 on 09/09/2024.  Is on dialysis, Hx of aortic dissection , prior DVT and thyroid  cancer  Orders in place:  Yes   Communication with surgical team if no orders: no  Labs, test, and orders reviewed: yes  Requires surgical clearance:  No  What type of clearance: n/a  Clearance received: n/a  Barriers noted:n/a    Intervention provided by [facility] team: n/a  Barrier resolved:  not applicable   -------------------------------------------------------------------------------------------------------------------  [employer], Ronal DELENA Bald Please contact us  directly via secure chat (search for [facility]) or by calling us  at [phone] [facility]).

## 2024-09-11 NOTE — Discharge Summary (Incomplete)
 PATIENT DETAILS Name: [name] Age: 58 y.o. Sex: female Date of Birth: [birth year] MRN: [id number]. Admitting Physician: Editha Ram, MD ERE:Rojmx, Comer POUR, NP  Admit Date: 09/07/2024 Discharge date: 09/12/2024  Recommendations for Outpatient Follow-up:  Follow up with PCP in 1-2 weeks Please obtain CMP/CBC in one week Please follow EGD biopsy results.  Admitted From:  Home  Disposition: Home   Discharge Condition: good  CODE STATUS:   Code Status: Full Code   Diet recommendation:  Diet Order             Diet renal/carb modified with fluid restriction Diet-HS Snack? Nothing; Fluid restriction: 1200 mL Fluid; Room service appropriate? Yes; Fluid consistency: Thin  Diet effective now           Diet - low sodium heart healthy           Diet Carb Modified                    Brief Summary: Patient is a 58 y.o.  female ESRD on PD, HIV, recent history of pituitary apoplexy, HTN, papillary thyroid  carcinoma-s/p thyroidectomy, sarcoidosis-who presented with abdominal pain, nausea, vomiting and diarrhea.  Initially-there was concern for PD catheter associated peritonitis-however workup was not consistent with peritonitis, subsequent stools since studies were obtained-which were suggestive of C. difficile colitis (recent antibiotic exposure apparently 3 weeks back)-and started on oral vancomycin .  See below for further details.   Significant events: 11/23>> admit to [facility].   Significant studies: 11/22>> CXR: No PNA 11/22>> CT abdomen: Stent graft repair of aortic dissection with continued dissection throughout the abdominal aorta-stable since prior study.  Small amount of ascites. 11/23>> peritoneal fluid: WBC 61 11/24>> barium esophagogram: Dysmotility-delayed passage of 13 mm barium tablet likely secondary to dysmotility.   Significant microbiology data: 11/22>> COVID/influenza/RSV PCR: Negative 11/23>> GI pathogen panel: Negative 11/23>>  stool C. difficile antigen positive, toxin negative. 11/23>> peritoneal fluid culture: No growth   Procedures: 11/26>> EGD: Normal esophagus/normal stomach   Consults: Nephrology GI  Brief Hospital Course: Presumed C. difficile colitis Although only stool C. difficile antigen but with Antibiotic exposure for UTI approximately 3 weeks back (took for 1 week)-diarrhea has improved after starting oral vancomycin . Will plan on at least 10 days of oral vancomycin . Attempt to minimize antibiotic exposure in the future.   Solid/liquid food dysphagia Likely secondary to dysmotility in the setting of GERD. Ongoing x 3 weeks Already on PPI-dosage switch to twice daily dosing Barium esophagram as above-GI consulted-underwent EGD on 11/26-this was unremarkable-no stricture but was empirically dilated.    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 Rosslyn Repeat MRI scheduled for December Per chart review-recent cortisol/LH/FSH/ACTH  levels were all within normal limits Currently no signs of adrenal crisis-continue to monitor closely and have patient follow-up with neurosurgery as planned.   HIV (CD4 445 on 10/15) Continue ART   History of papillary thyroid  carcinoma-s/p thyroidectomy Synthroid -TSH stable   DM-2 (A1c 6.1) CBGs stable Resume saxagliptin  on discharge.  HTN Blood pressure has been stable-without the use of any medications Note-has been holding her antihypertensives even prior to this hospitalization Continue close outpatient monitoring by PCP/nephrology  ESRD on PD Resume PD as previous.   Normocytic anemia Secondary to ESRD-defer Aranesp /iron  therapies to nephrology service   Hypokalemia Secondary diarrhea Repleted.   History of aortic dissection-s/p stent graft November 2024 CT abdomen-stable-unchanged from prior Supportive care. Outpatient follow-up with vascular/cardiothoracic surgery.  Gout Allopurinol    Chronic low back pain Unchanged-stable Continue as needed narcotics/Robaxin .   Anxiety/depression Stable BuSpar   Poor appetite Per patient-this has been ongoing for the past month or so-persistent on the phone on 11/27 she has lost some weight in the past month or so. EGD unremarkable-no stricture or major structural issues-suspect poor appetite is a combination of some amount of esophageal dysmotility-and some amount of just anorexia.  Discussed about maybe starting Remeron/Marinol-however patient very hesitant (her brother-in-law committed suicide-and apparently was started on antidepressant medications) about starting these medications due to side effects (sister on the phone)-although her appetite is poor-she however is able to drink supplements and  eat some amount of her food and snack quite a bit per RN.  I have encouraged patient to buy nutritional supplements that she likes-in order to supplement diet/meals.  I have encouraged her to talk to her primary care practitioner about  starting appetite stimulants-if she still continues to have poor appetite/loss of weight-in spite of treating C. difficile/changing PPI to twice daily and using nutrition supplements.  Furthermore-she recently had pituitary apoplexy-and although her recent hormone profile was stable-she may slowly be developing signs of hormonal deficiencies-she has follow-up with her neurosurgeon soon for repeat MRI/repeat hormone panel  Discharge Diagnoses:  Principal Problem:   Peritonitis (HCC) Active Problems:   HIV (human immunodeficiency virus infection) (HCC)   Type 2 diabetes mellitus with hyperlipidemia (HCC)   ESRD (end stage renal disease) (HCC)   Esophageal dysphagia   Diarrhea   Abnormal esophagram   Abdominal pain, chronic, epigastric   Gastritis and gastroduodenitis   Discharge Instructions:  Activity:  As tolerated   Discharge Instructions     Call MD for:  difficulty  breathing, headache or visual disturbances   Complete by: As directed    Call MD for:  persistant nausea and vomiting   Complete by: As directed    Call MD for:  severe uncontrolled pain   Complete by: As directed    Diet - low sodium heart healthy   Complete by: As directed    Diet Carb Modified   Complete by: As directed    Discharge instructions   Complete by: As directed    Follow with Primary MD  Gretta Comer POUR, NP in 1-2 weeks  Please get a complete blood count and chemistry panel checked by your Primary MD at your next visit, and again as instructed by your Primary MD.  Get Medicines reviewed and adjusted: Please take all your medications with you for your next visit with your Primary MD  Laboratory/radiological data: Please request your Primary MD to go over all hospital tests and procedure/radiological results at the follow up, please ask your Primary MD to get all Hospital records sent to his/her office.  In some cases, they will be blood work, cultures and biopsy results pending at the time of your discharge. Please request that your primary care M.D. follows up on these results.  Also Note the following: If you experience worsening of your admission symptoms, develop shortness of breath, life threatening emergency, suicidal or homicidal thoughts you must seek medical attention immediately by calling 911 or calling your MD immediately  if symptoms less severe.  You must read complete instructions/literature along with all the possible adverse reactions/side effects for all the Medicines you take and that have been prescribed to you. Take any new Medicines after you have completely understood and accpet all the possible adverse reactions/side effects.   Do not drive  when taking Pain medications or sleeping medications (Benzodaizepines)  Do not take more than prescribed Pain, Sleep and Anxiety Medications. It is not advisable to combine anxiety,sleep and pain medications  without talking with your primary care practitioner  Special Instructions: If you have smoked or chewed Tobacco  in the last 2 yrs please stop smoking, stop any regular Alcohol   and or any Recreational drug use.  Wear Seat belts while driving.  Please note: You were cared for by a hospitalist during your hospital stay. Once you are discharged, your primary care physician will handle any further medical issues. Please note that NO REFILLS for any discharge medications will be authorized once you are discharged, as it is imperative that you return to your primary care physician (or establish a relationship with a primary care physician if you do not have one) for your post hospital discharge needs so that they can reassess your need for medications and monitor your lab values.   Discharge wound care:   Complete by: As directed    Exit site care:  Once      Comments: Clean skin near exit site with chloraprep swab sticks.  Starting at catheter, use circular pattern around exit site, moving towards outer edges of area covered by dressing.  Apply gentamicin  cream to site once daily.  Cover with dry dressing.   Increase activity slowly   Complete by: As directed       Allergies as of 09/12/2024       Reactions   Genvoya [elviteg-cobic-emtricit-tenofaf] Hives   Lisinopril Cough   Aldactone  [spironolactone ] Hives   Valium  [diazepam ] Other (See Comments)   Lethargy (pt states she cannot take this again)   Tegaderm Ag Mesh [silver] Itching        Medication List     STOP taking these medications    gabapentin  100 MG capsule Commonly known as: Neurontin    HYDROcodone -acetaminophen  5-325 MG tablet Commonly known as: NORCO/VICODIN   losartan  100 MG tablet Commonly known as: COZAAR    losartan  50 MG tablet Commonly known as: COZAAR        TAKE these medications    acetaminophen  500 MG tablet Commonly known as: TYLENOL  Take 1,000 mg by mouth every 6 (six) hours as needed for mild  pain.   albuterol  108 (90 Base) MCG/ACT inhaler Commonly known as: VENTOLIN  HFA INHALE 2 PUFFS INTO THE LUNGS UP TO EVERY 6HRS AS NEEDED FOR WHEEZING/SHORTNESS OF BREATH   allopurinol  100 MG tablet Commonly known as: ZYLOPRIM  TAKE 1 TABLET (100 MG TOTAL) BY MOUTH DAILY. FOR GOUT PREVENTION   Auryxia  1 GM 210 MG(Fe) tablet Generic drug: ferric citrate  Take 210 mg by mouth 3 (three) times daily with meals.   busPIRone  5 MG tablet Commonly known as: BUSPAR  TAKE 1 TABLET BY MOUTH TWICE A DAY FOR ANXIETY   calcitRIOL 0.25 MCG capsule Commonly known as: ROCALTROL Take 0.25 mcg by mouth 3 (three) times a week.   dolutegravir  50 MG tablet Commonly known as: [name]  Take 1 tablet (50 mg total) by mouth daily.   emtricitabine -tenofovir  AF 200-25 MG tablet Commonly known as: DESCOVY  Take 1 tablet by mouth daily.   fluticasone  110 MCG/ACT inhaler Commonly known as: FLOVENT  HFA Inhale 1 puff into the lungs 2 (two) times daily.   levocetirizine 5 MG tablet Commonly known as: XYZAL  TAKE 1 TABLET BY MOUTH EVERY DAY IN THE EVENING FOR ALLERGIES   lidocaine  5 % Commonly known as: LIDODERM  PLACE 1 PATCH ONTO THE SKIN DAILY. REMOVE &  DISCARD PATCH WITHIN 12 HOURS OR AS DIRECTED BY MD   methocarbamol  500 MG tablet Commonly known as: ROBAXIN  Take 1 tablet (500 mg total) by mouth every 6 (six) hours as needed for muscle spasms.   minocycline  100 MG capsule Commonly known as: Minocin  Take 1 capsule (100 mg total) by mouth 2 (two) times daily.   nystatin  100000 UNIT/ML suspension Commonly known as: MYCOSTATIN  Take 5 mLs by mouth 4 (four) times daily.   omeprazole  40 MG capsule Commonly known as: PRILOSEC Take 1 capsule (40 mg total) by mouth 2 (two) times daily. for heartburn. What changed: when to take this   ondansetron  4 MG disintegrating tablet Commonly known as: ZOFRAN -ODT Take 1 tablet (4 mg total) by mouth every 8 (eight) hours as needed for nausea or vomiting.   onetouch  ultrasoft lancets Use as instructed to test blood sugar daily   rosuvastatin  10 MG tablet Commonly known as: CRESTOR  TAKE 1 TABLET BY MOUTH EVERY DAY FOR CHOLESTEROL   saxagliptin  HCl 2.5 MG Tabs tablet Commonly known as: ONGLYZA TAKE 1 TABLET (2.5 MG TOTAL) BY MOUTH DAILY. FOR DIABETES.   sevelamer  carbonate 800 MG tablet Commonly known as: RENVELA  Take 800 mg by mouth 3 (three) times daily with meals.   Synthroid  150 MCG tablet Generic drug: levothyroxine  TAKE 1 TABLET BY MOUTH DAILY BEFORE BREAKFAST.   vancomycin  125 MG capsule Commonly known as: VANCOCIN  Take 1 capsule (125 mg total) by mouth 4 (four) times daily for 5 days.               Discharge Care Instructions  (From admission, onward)           Start     Ordered   09/11/24 0000  Discharge wound care:       Comments: Exit site care:  Once      Comments: Clean skin near exit site with chloraprep swab sticks.  Starting at catheter, use circular pattern around exit site, moving towards outer edges of area covered by dressing.  Apply gentamicin  cream to site once daily.  Cover with dry dressing.   09/11/24 1402            Follow-up Information     Gretta Comer POUR, NP. Schedule an appointment as soon as possible for a visit in 1 week(s).   Specialty: Internal Medicine Contact information: [address] [place] [address] [phone]         [facility], [facility] Follow up.   Specialty: Home Health Services Why: Hedda will contact you for the next home visit Contact information: [address] [place] [address] [phone]                Allergies  Allergen Reactions   Genvoya [Elviteg-Cobic-Emtricit-Tenofaf] Hives   Lisinopril Cough   Aldactone  [Spironolactone ] Hives   Valium  [Diazepam ] Other (See Comments)    Lethargy (pt states she cannot take this again)   Tegaderm Ag Mesh [Silver] Itching     Other Procedures/Studies: DG ESOPHAGUS W SINGLE  CM (SOL OR THIN BA) Result Date: 09/09/2024 CLINICAL DATA:  Inpatient with history of ESRD on PD, DM 2, aortic dissection status post endovascular repair, prior DVT not on anticoagulation anymore, HTN, HIV, HLD, 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 She reported dysphagia and globus sensation during meals leading to request for esophagram. EXAM: ESOPHAGUS/BARIUM SWALLOW/TABLET STUDY TECHNIQUE: Single contrast examination was performed using thin liquid barium. This exam  was performed by Brittany Huneycutt, NP, and was supervised and interpreted by Dr. Rockey Kilts. FLUOROSCOPY: Radiation Exposure Index (as provided by the fluoroscopic device): 38.3 mGy Kerma COMPARISON:  None Available. FINDINGS: Swallowing: Appears normal. No vestibular penetration or aspiration seen. Pharynx: Unremarkable. Esophagus: Normal appearance. Esophageal motility: Mild esophageal dysmotility seen as tertiary contractions within the mid esophagus, best seen in series #2. Hiatal Hernia: Absent Gastroesophageal reflux: None visualized. Ingested 13 mm barium tablet: The tablet was delayed within the mid esophagus. This passed to the distal esophagus with subsequent sips and remained at the GE junction despite providing additional fluids and applesauce Other: Radiopaque bodies consistent with above mentioned surgical history. 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 Mild delayed passage of a 13 mm barium tablet, likely secondary to dysmotility. Electronically Signed   By: Rockey Kilts M.D.   On: 09/09/2024 15:27   CT ABDOMEN WO CONTRAST Result Date: 09/07/2024 EXAM: CT ABDOMEN WITHOUT CONTRAST 09/07/2024 10:05:51 PM TECHNIQUE: CT of the abdomen was performed without the administration of intravenous contrast. Multiplanar reformatted images are provided for review. Automated exposure control, iterative reconstruction, and/or weight based adjustment of  the mA/kV was utilized to reduce the radiation dose to as low as reasonably achievable. COMPARISON: 07/18/2024 CLINICAL HISTORY: Epigastric pain; Diffuse abd pain, N/V/D. FINDINGS: LOWER CHEST: Visualized portion of the lower chest demonstrates no acute abnormality. HEPATOBILIARY: The visualized liver is unremarkable. 1 cm gallstone layering within the gallbladder. SPLEEN: Spleen demonstrates no acute abnormality. PANCREAS: Pancreas demonstrates no acute abnormality. ADRENAL GLANDS: Diffuse enlargement of the left adrenal gland compatible with hyperplasia, stable. KIDNEYS: No stones in the kidneys or proximal ureters. No hydronephrosis. No perinephric or periureteral stranding. GI AND BOWEL: Stomach and duodenal sweep demonstrate no acute abnormality. There is no bowel obstruction. No abnormal bowel wall thickening or distension. PERITONEUM AND RETROPERITONEUM: Small amount of ascites adjacent to the liver and spleen. Stent graft repair of aortic dissection. Stent graft extends into the upper abdominal aorta. Dissection continues throughout the abdominal aorta, unchanged since prior study . No free air. LYMPH NODES: No lymphadenopathy. BONES AND SOFT TISSUES: No acute abnormality of the visualized bones. No focal soft tissue abnormality. IMPRESSION: 1. Stent graft repair of aortic dissection with continued dissection throughout the abdominal aorta, stable since prior study . 2. Small amount of ascites adjacent to the liver and spleen. Electronically signed by: Franky Crease MD 09/07/2024 10:19 PM EST RP Workstation: [id number]   DG Chest Portable 1 View Result Date: 09/07/2024 EXAM: 1 VIEW(S) XRAY OF THE CHEST 09/07/2024 08:40:00 PM COMPARISON: None available. CLINICAL HISTORY: SOB FINDINGS: LUNGS AND PLEURA: No vascular congestion, edema, or consolidation. No pleural effusion or pneumothorax. HEART AND MEDIASTINUM: Postoperative changes in the mediastinum. Thoracic aortic stent in place. Mild cardiac enlargement.  BONES AND SOFT TISSUES: Surgical clips in the base of the neck. No acute osseous abnormality. IMPRESSION: 1. No acute cardiopulmonary process. 2. Mild cardiomegaly. Electronically signed by: Elsie Gravely MD 09/07/2024 08:47 PM EST RP Workstation: [id number]   VAS US  ABI WITH/WO TBI Result Date: 08/15/2024  LOWER EXTREMITY DOPPLER STUDY Patient Name:  Artie Takayama  Date of Exam:   08/15/2024 Medical Rec #: [id number]              Accession #:    [id number] Date of Birth: May 15, 1966              Patient Gender: F Patient Age:   84 years Exam Location:  [address] Procedure:  VAS US  ABI WITH/WO TBI Referring Phys: JOSHUA ROBINS --------------------------------------------------------------------------------  Indications: Pain in both feet. High Risk         Hypertension, hyperlipidemia, Diabetes, past history of Factors:          smoking.  Performing Technologist: [place] RVT  Examination Guidelines: A complete evaluation includes at minimum, Doppler waveform signals and systolic blood pressure reading at the level of bilateral brachial, anterior tibial, and posterior tibial arteries, when vessel segments are accessible. Bilateral testing is considered an integral part of a complete examination. Photoelectric Plethysmograph (PPG) waveforms and toe systolic pressure readings are included as required and additional duplex testing as needed. Limited examinations for reoccurring indications may be performed as noted.  ABI Findings: +---------+------------------+-----+-----------+--------+ Right    Rt Pressure (mmHg)IndexWaveform   Comment  +---------+------------------+-----+-----------+--------+ Brachial 117                                        +---------+------------------+-----+-----------+--------+ ATA      149               1.27 multiphasic         +---------+------------------+-----+-----------+--------+ PTA      142               1.21 multiphasic          +---------+------------------+-----+-----------+--------+ Great Toe110               0.94                     +---------+------------------+-----+-----------+--------+ +---------+------------------+-----+-----------+-------+ Left     Lt Pressure (mmHg)IndexWaveform   Comment +---------+------------------+-----+-----------+-------+ Brachial 113                                       +---------+------------------+-----+-----------+-------+ ATA      146               1.25 multiphasic        +---------+------------------+-----+-----------+-------+ PTA      119               1.02 triphasic          +---------+------------------+-----+-----------+-------+ Great Toe106               0.91                    +---------+------------------+-----+-----------+-------+ +-------+-----------+-----------+------------+------------+ ABI/TBIToday's ABIToday's TBIPrevious ABIPrevious TBI +-------+-----------+-----------+------------+------------+ Right  1.27       0.94       1.22        0.84         +-------+-----------+-----------+------------+------------+ Left   1.25       0.91       1.13        0.81         +-------+-----------+-----------+------------+------------+  Previous ABI on 12/14/23. Cannot rule out medial calcification.  Summary: Right: Resting right ankle-brachial index is within normal range. The right toe-brachial index is normal.  Left: Resting left ankle-brachial index is within normal range. The left toe-brachial index is normal.  *See table(s) above for measurements and observations.  Electronically signed by Fonda Rim on 08/15/2024 at 4:31:04 PM.    Final      TODAY-DAY OF DISCHARGE:  Subjective:   [name] today has no headache,no chest  abdominal pain,no new weakness tingling or numbness, feels much better wants to go home today.   Objective:   Blood pressure 110/65, pulse 94, temperature 97.8 F (36.6 C), temperature source Oral, resp. rate  18, height 5' 5 (1.651 m), weight 80.7 kg, last menstrual period 03/31/2014, SpO2 99%.  Intake/Output Summary (Last 24 hours) at 09/12/2024 0916 Last data filed at 09/12/2024 0630 Gross per 24 hour  Intake 206 ml  Output 0 ml  Net 206 ml   Filed Weights   09/07/24 1904 09/11/24 1126  Weight: 80.7 kg 80.7 kg    Exam: Awake Alert, Oriented *3, No new F.N deficits, Normal affect [place].AT,PERRAL Supple Neck,No JVD, No cervical lymphadenopathy appriciated.  Symmetrical Chest wall movement, Good air movement bilaterally, CTAB RRR,No Gallops,Rubs or new Murmurs, No Parasternal Heave +ve B.Sounds, Abd Soft, Non tender, No organomegaly appriciated, No rebound -guarding or rigidity. No Cyanosis, Clubbing or edema, No new Rash or bruise   PERTINENT RADIOLOGIC STUDIES: No results found.    PERTINENT LAB RESULTS: CBC: No results for input(s): WBC, HGB, HCT, PLT in the last 72 hours.  CMET CMP     Component Value Date/Time   NA 136 09/10/2024 0246   NA 142 12/05/2022 0000   K 3.5 09/10/2024 0246   CL 92 (L) 09/10/2024 0246   CO2 25 09/10/2024 0246   GLUCOSE 148 (H) 09/10/2024 0246   BUN 39 (H) 09/10/2024 0246   BUN 44 (A) 12/05/2022 0000   CREATININE 14.70 (H) 09/10/2024 0246   CREATININE 2.72 (H) 07/27/2021 1110   CREATININE 2.74 (H) 03/17/2021 1050   CALCIUM  8.4 (L) 09/10/2024 0246   PROT 6.5 09/09/2024 0358   ALBUMIN  1.9 (L) 09/10/2024 0246   AST 22 09/09/2024 0358   AST 28 07/27/2021 1110   ALT 11 09/09/2024 0358   ALT 19 07/27/2021 1110   ALKPHOS 212 (H) 09/09/2024 0358   BILITOT 1.0 09/09/2024 0358   BILITOT 0.3 07/27/2021 1110   GFR 11.21 (LL) 12/19/2022 1242   EGFR 12 12/05/2022 0000   GFRNONAA 3 (L) 09/10/2024 0246   GFRNONAA 20 (L) 07/27/2021 1110    GFR Estimated Creatinine Clearance: 4.5 mL/min (A) (by C-G formula based on SCr of 14.7 mg/dL (H)). No results for input(s): LIPASE, AMYLASE in the last 72 hours. No results for input(s):  CKTOTAL, CKMB, CKMBINDEX, TROPONINI in the last 72 hours. Invalid input(s): POCBNP No results for input(s): DDIMER in the last 72 hours. No results for input(s): HGBA1C in the last 72 hours. No results for input(s): CHOL, HDL, LDLCALC, TRIG, CHOLHDL, LDLDIRECT in the last 72 hours. No results for input(s): TSH, T4TOTAL, T3FREE, THYROIDAB in the last 72 hours.  Invalid input(s): FREET3 No results for input(s): VITAMINB12, FOLATE, FERRITIN, TIBC, IRON , RETICCTPCT in the last 72 hours. Coags: No results for input(s): INR in the last 72 hours.  Invalid input(s): PT Microbiology: Recent Results (from the past 240 hours)  Resp panel by RT-PCR (RSV, Flu A&B, Covid) Anterior Nasal Swab     Status: None   Collection Time: 09/07/24  7:57 PM   Specimen: Anterior Nasal Swab  Result Value Ref Range Status   SARS Coronavirus 2 by RT PCR NEGATIVE NEGATIVE Final   Influenza A by PCR NEGATIVE NEGATIVE Final   Influenza B by PCR NEGATIVE NEGATIVE Final    Comment: (NOTE) The Xpert Xpress SARS-CoV-2/FLU/RSV plus assay is intended as an aid in the diagnosis of influenza from Nasopharyngeal swab specimens and should not be used as a  sole basis for treatment. Nasal washings and aspirates are unacceptable for Xpert Xpress SARS-CoV-2/FLU/RSV testing.  Fact Sheet for Patients: bloggercourse.com  Fact Sheet for Healthcare Providers: seriousbroker.it  This test is not yet approved or cleared by the [place]  FDA and has been authorized for detection and/or diagnosis of SARS-CoV-2 by FDA under an Emergency Use Authorization (EUA). This EUA will remain in effect (meaning this test can be used) for the duration of the COVID-19 declaration under Section 564(b)(1) of the Act, 21 U.S.C. section 360bbb-3(b)(1), unless the authorization is terminated or revoked.     Resp Syncytial Virus by PCR NEGATIVE  NEGATIVE Final    Comment: (NOTE) Fact Sheet for Patients: bloggercourse.com  Fact Sheet for Healthcare Providers: seriousbroker.it  This test is not yet approved or cleared by the [place]  FDA and has been authorized for detection and/or diagnosis of SARS-CoV-2 by FDA under an Emergency Use Authorization (EUA). This EUA will remain in effect (meaning this test can be used) for the duration of the COVID-19 declaration under Section 564(b)(1) of the Act, 21 U.S.C. section 360bbb-3(b)(1), unless the authorization is terminated or revoked.  Performed at [facility], [name], [place], [place] [address]   C Difficile Quick Screen w PCR reflex     Status: Abnormal   Collection Time: 09/08/24  4:30 AM   Specimen: STOOL  Result Value Ref Range Status   C Diff antigen POSITIVE (A) NEGATIVE Final   C Diff toxin NEGATIVE NEGATIVE Final   C Diff interpretation Results are indeterminate. See PCR results.  Final    Comment: Performed at [facility], [address], [place], [place] [address]  Gastrointestinal Panel by PCR , Stool     Status: None   Collection Time: 09/08/24  4:30 AM   Specimen: STOOL  Result Value Ref Range Status   Campylobacter species NOT DETECTED NOT DETECTED Final   Plesimonas shigelloides NOT DETECTED NOT DETECTED Final   Salmonella species NOT DETECTED NOT DETECTED Final   Yersinia enterocolitica NOT DETECTED NOT DETECTED Final   Vibrio species NOT DETECTED NOT DETECTED Final   Vibrio cholerae NOT DETECTED NOT DETECTED Final   Enteroaggregative E coli (EAEC) NOT DETECTED NOT DETECTED Final   Enteropathogenic E coli (EPEC) NOT DETECTED NOT DETECTED Final   Enterotoxigenic E coli (ETEC) NOT DETECTED NOT DETECTED Final   Shiga like toxin producing E coli (STEC) NOT DETECTED NOT DETECTED Final   Shigella/Enteroinvasive E coli (EIEC) NOT DETECTED NOT DETECTED Final   Cryptosporidium NOT DETECTED  NOT DETECTED Final   Cyclospora cayetanensis NOT DETECTED NOT DETECTED Final   Entamoeba histolytica NOT DETECTED NOT DETECTED Final   Giardia lamblia NOT DETECTED NOT DETECTED Final   Adenovirus F40/41 NOT DETECTED NOT DETECTED Final   Astrovirus NOT DETECTED NOT DETECTED Final   Norovirus GI/GII NOT DETECTED NOT DETECTED Final   Rotavirus A NOT DETECTED NOT DETECTED Final   Sapovirus (I, II, IV, and V) NOT DETECTED NOT DETECTED Final    Comment: Performed at [facility], [address] [place], [place] [address]  C. Diff by PCR, Reflexed     Status: None   Collection Time: 09/08/24  4:30 AM  Result Value Ref Range Status   Toxigenic C. Difficile by PCR NEGATIVE NEGATIVE Final    Comment: Patient is colonized with non toxigenic C. difficile. May not need treatment unless significant symptoms are present.   Hypervirulent Strain PRESUMPTIVE NEGATIVE PRESUMPTIVE NEGATIVE Final  Comment: Performed at [facility], [address], [place], [place] [address]  Body fluid culture w Gram Stain     Status: None   Collection Time: 09/08/24 10:30 AM   Specimen: Path fluid; Body Fluid  Result Value Ref Range Status   Specimen Description FLUID  Final   Special Requests PERITONEAL  Final   Gram Stain NO WBC SEEN NO ORGANISMS SEEN   Final   Culture   Final    NO GROWTH 3 DAYS Performed at [facility], [address], [place], [place] [address]    Report Status 09/11/2024 FINAL  Final    FURTHER DISCHARGE INSTRUCTIONS:  Get Medicines reviewed and adjusted: Please take all your medications with you for your next visit with your Primary MD  Laboratory/radiological data: Please request your Primary MD to go over all hospital tests and procedure/radiological results at the follow up, please ask your Primary MD to get all Hospital records sent to his/her office.  In some cases, they will be blood work, cultures and biopsy results pending at the time of your discharge.  Please request that your primary care M.D. goes through all the records of your hospital data and follows up on these results.  Also Note the following: If you experience worsening of your admission symptoms, develop shortness of breath, life threatening emergency, suicidal or homicidal thoughts you must seek medical attention immediately by calling 911 or calling your MD immediately  if symptoms less severe.  You must read complete instructions/literature along with all the possible adverse reactions/side effects for all the Medicines you take and that have been prescribed to you. Take any new Medicines after you have completely understood and accpet all the possible adverse reactions/side effects.   Do not drive when taking Pain medications or sleeping medications (Benzodaizepines)  Do not take more than prescribed Pain, Sleep and Anxiety Medications. It is not advisable to combine anxiety,sleep and pain medications without talking with your primary care practitioner  Special Instructions: If you have smoked or chewed Tobacco  in the last 2 yrs please stop smoking, stop any regular Alcohol   and or any Recreational drug use.  Wear Seat belts while driving.  Please note: You were cared for by a hospitalist during your hospital stay. Once you are discharged, your primary care physician will handle any further medical issues. Please note that NO REFILLS for any discharge medications will be authorized once you are discharged, as it is imperative that you return to your primary care physician (or establish a relationship with a primary care physician if you do not have one) for your post hospital discharge needs so that they can reassess your need for medications and monitor your lab values.  Total Time spent coordinating discharge including counseling, education and face to face time equals greater than 30 minutes.  SignedBETHA Donalda Applebaum 09/12/2024 9:16 AM

## 2024-09-11 NOTE — Transfer of Care (Signed)
 Immediate Anesthesia Transfer of Care Note  Patient: [name]  Procedure(s) Performed: EGD (ESOPHAGOGASTRODUODENOSCOPY)  Patient Location: PACU  Anesthesia Type:MAC  Level of Consciousness: awake, drowsy, and patient cooperative  Airway & Oxygen  Therapy: Patient Spontanous Breathing and Patient connected to nasal cannula oxygen   Post-op Assessment: Report given to RN and Post -op Vital signs reviewed and stable  Post vital signs: Reviewed and stable  Last Vitals:  Vitals Value Taken Time  BP 102/59 09/11/24 13:00  Temp 36.2 C 09/11/24 12:56  Pulse 107 09/11/24 12:56  Resp 18 09/11/24 13:03  SpO2 98 % 09/11/24 12:56  Vitals shown include unfiled device data.  Last Pain:  Vitals:   09/11/24 1256  TempSrc: Temporal  PainSc: 0-No pain      Patients Stated Pain Goal: 0 (09/11/24 1126)  Complications: No notable events documented.

## 2024-09-12 ENCOUNTER — Encounter (HOSPITAL_COMMUNITY): Payer: Self-pay | Admitting: Internal Medicine

## 2024-09-12 DIAGNOSIS — K659 Peritonitis, unspecified: Secondary | ICD-10-CM | POA: Diagnosis not present

## 2024-09-12 DIAGNOSIS — R1319 Other dysphagia: Secondary | ICD-10-CM | POA: Diagnosis not present

## 2024-09-12 DIAGNOSIS — E1169 Type 2 diabetes mellitus with other specified complication: Secondary | ICD-10-CM | POA: Diagnosis not present

## 2024-09-12 DIAGNOSIS — Z21 Asymptomatic human immunodeficiency virus [HIV] infection status: Secondary | ICD-10-CM | POA: Diagnosis not present

## 2024-09-12 LAB — GLUCOSE, CAPILLARY
Glucose-Capillary: 136 mg/dL — ABNORMAL HIGH (ref 70–99)
Glucose-Capillary: 78 mg/dL (ref 70–99)

## 2024-09-12 MED ORDER — OMEPRAZOLE 40 MG PO CPDR: 40.0000 mg | DELAYED_RELEASE_CAPSULE | Freq: Two times a day (BID) | ORAL | 2 refills | Status: AC

## 2024-09-12 NOTE — TOC Transition Note (Signed)
 Transition of Care [facility]) - Discharge Note   Patient Details  Name: [name] MRN: [id number] Date of Birth: 11-Jul-1966  Transition of Care [facility]) CM/SW Contact:  Tom-Johnson, Dayra Rapley Daphne, RN Phone Number: 09/12/2024, 9:51 AM   Clinical Narrative:     Patient is scheduled for discharge today.  Readmission Risk Assessment done. Home health info, outpatient f/u, hospital f/u and discharge instructions on AVS. Abx prescription sent to [facility] pharmacy and patient will receive meds prior discharge. Father, Lynwood to transport at discharge.  No further ICM needs noted.      Final next level of care: Home w Home Health Services Barriers to Discharge: Barriers Resolved   Patient Goals and CMS Choice Patient states their goals for this hospitalization and ongoing recovery are:: To return home CMS Medicare.gov Compare Post Acute Care list provided to:: Patient Choice offered to / list presented to : Patient      Discharge Placement                Patient to be transferred to facility by: Father Name of family member notified: Lynwood    Discharge Plan and Services Additional resources added to the After Visit Summary for                                       Social Drivers of Health (SDOH) Interventions SDOH Screenings   Food Insecurity: No Food Insecurity (09/08/2024)  Recent Concern: Food Insecurity - Food Insecurity Present (06/27/2024)  Housing: Low Risk  (09/09/2024)  Recent Concern: Housing - High Risk (06/27/2024)  Transportation Needs: No Transportation Needs (09/08/2024)  Utilities: Not At Risk (09/08/2024)  Recent Concern: Utilities - At Risk (06/27/2024)  Alcohol  Screen: Low Risk  (06/27/2024)  Depression (PHQ2-9): Low Risk  (08/28/2024)  Recent Concern: Depression (PHQ2-9) - Medium Risk (06/05/2024)  Financial Resource Strain: Medium Risk (06/27/2024)  Physical Activity: Inactive (06/27/2024)  Social Connections: Unknown (06/27/2024)   Stress: Stress Concern Present (06/27/2024)  Tobacco Use: Medium Risk (09/11/2024)  Health Literacy: Adequate Health Literacy (06/27/2024)     Readmission Risk Interventions    09/12/2024    9:43 AM 02/21/2023    4:05 PM  Readmission Risk Prevention Plan  Transportation Screening Complete Complete  PCP or Specialist Appt within 3-5 Days Complete   HRI or Home Care Consult Complete   Social Work Consult for Recovery Care Planning/Counseling Complete   Palliative Care Screening Not Applicable   Medication Review Oceanographer) Referral to Pharmacy Complete  PCP or Specialist appointment within 3-5 days of discharge  Complete  HRI or Home Care Consult  Complete  SW Recovery Care/Counseling Consult  Complete  Palliative Care Screening  Not Applicable  Skilled Nursing Facility  Not Applicable

## 2024-09-12 NOTE — Progress Notes (Signed)
 [facility] Progress Note   Subjective:   Thinks she will be going home today. Denies SOB, CP, dizziness, nausea.   Objective Vitals:   09/11/24 1940 09/11/24 2359 09/12/24 0419 09/12/24 0825  BP:  102/65 110/65   Pulse:    94  Resp:    18  Temp: 97.6 F (36.4 C) (!) 97.4 F (36.3 C) 97.8 F (36.6 C)   TempSrc: Axillary Oral Oral   SpO2:      Weight:      Height:       Physical Exam  General: Alert female in NAD Heart: RRR, no murmurs Lungs: CTA bilaterally, respirations unlabored Abdomen: Soft, non-distended, +BS. No TTP Extremities: No edema b/l lower extremities Dialysis Access:  PD cath in R abdomen  Additional Objective Labs: Basic Metabolic Panel: Recent Labs  Lab 09/08/24 0527 09/09/24 0358 09/10/24 0246  NA 137 134* 136  K 3.7 3.2* 3.5  CL 95* 91* 92*  CO2 24 24 25   GLUCOSE 68* 77 148*  BUN 40* 38* 39*  CREATININE 15.88* 14.73* 14.70*  CALCIUM  8.3* 8.2* 8.4*  PHOS  --  7.0* 6.2*   Liver Function Tests: Recent Labs  Lab 09/07/24 1922 09/09/24 0358 09/10/24 0246  AST 28 22  --   ALT 13 11  --   ALKPHOS 228* 212*  --   BILITOT 1.0 1.0  --   PROT 7.3 6.5  --   ALBUMIN  2.3* 1.9* 1.9*   Recent Labs  Lab 09/07/24 1922  LIPASE 20   CBC: Recent Labs  Lab 09/07/24 1922 09/08/24 0527 09/09/24 0358  WBC 10.5 9.8 9.4  HGB 12.1 10.5* 10.9*  HCT 37.2 31.0* 32.2*  MCV 110.7* 108.4* 108.4*  PLT 207 177 173   Blood Culture    Component Value Date/Time   SDES FLUID 09/08/2024 1030   SPECREQUEST PERITONEAL 09/08/2024 1030   CULT  09/08/2024 1030    NO GROWTH 3 DAYS Performed at [facility], [address] [place], [place] [address]    REPTSTATUS 09/11/2024 FINAL 09/08/2024 1030    Cardiac Enzymes: No results for input(s): CKTOTAL, CKMB, CKMBINDEX, TROPONINI in the last 168 hours. CBG: Recent Labs  Lab 09/11/24 1656 09/11/24 1739 09/11/24 2135 09/12/24 0029 09/12/24 0805  GLUCAP 67* 84 133* 136* 78    Iron  Studies: No results for input(s): IRON , TIBC, TRANSFERRIN, FERRITIN in the last 72 hours. @lablastinr3 @ Studies/Results: No results found. Medications:  dialysis solution 1.5% low-MG/low-CA      allopurinol   100 mg Oral Daily   budesonide  (PULMICORT ) nebulizer solution  0.25 mg Nebulization BID   busPIRone   5 mg Oral BID   dolutegravir   50 mg Oral Daily   emtricitabine -tenofovir  AF  1 tablet Oral Daily   gentamicin  cream  1 Application Topical Daily   levothyroxine   150 mcg Oral QAC breakfast   lidocaine   1 patch Transdermal Q24H   loratadine   10 mg Oral QPM   nystatin   5 mL Oral QID   pantoprazole   40 mg Oral BID AC   rosuvastatin   10 mg Oral Daily   sevelamer  carbonate  800 mg Oral TID WC   vancomycin   125 mg Oral QID    Dialysis Orders:  [facility] 7x week-[facility] 5 exchanges, fill vol 2250, 1.5hr dwell time 1/2 1.5% bags; 1/2 2.5% bags (usual per pt)    K+ 3.7, BUN 40, creat 15  Ca 8.3      Assessment/Plan: N/V/D/ abd pain:  Initial concern for possible PD cath related peritonitis. Empiric IV abx were started. PD fluid cell count was 61 and she does not have any abdominal pain today, culture NGTD, does not appear to be peritonitis. Reports nausea and diarrhea for several days, presumed c. Diff colitis per PMD. Improving. S/p EGD ESRD: on CCPD nightly. Plan for PD again today- can do it at home if discharhed BP: home bp meds on hold, BP's have not been high lately and are in fact borderline low with poor PO intake lately.  Volume: no vol excess on exam. Follow.  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  Lucie Collet, PA-C 09/12/2024, 10:02 AM  [facility] Pager: [phone]

## 2024-09-12 NOTE — Progress Notes (Signed)
 Discharge instructions (including medications) discussed with and copy provided to patient and she verbalized understanding. PIV removed and she dressed herself.

## 2024-09-13 ENCOUNTER — Other Ambulatory Visit: Payer: Self-pay | Admitting: Internal Medicine

## 2024-09-13 DIAGNOSIS — N186 End stage renal disease: Secondary | ICD-10-CM | POA: Diagnosis not present

## 2024-09-13 DIAGNOSIS — Z992 Dependence on renal dialysis: Secondary | ICD-10-CM | POA: Diagnosis not present

## 2024-09-13 DIAGNOSIS — N2581 Secondary hyperparathyroidism of renal origin: Secondary | ICD-10-CM | POA: Diagnosis not present

## 2024-09-13 LAB — SURGICAL PATHOLOGY

## 2024-09-15 DIAGNOSIS — N2581 Secondary hyperparathyroidism of renal origin: Secondary | ICD-10-CM | POA: Diagnosis not present

## 2024-09-15 DIAGNOSIS — Z992 Dependence on renal dialysis: Secondary | ICD-10-CM | POA: Diagnosis not present

## 2024-09-15 DIAGNOSIS — E1122 Type 2 diabetes mellitus with diabetic chronic kidney disease: Secondary | ICD-10-CM | POA: Diagnosis not present

## 2024-09-15 DIAGNOSIS — N186 End stage renal disease: Secondary | ICD-10-CM | POA: Diagnosis not present

## 2024-09-16 ENCOUNTER — Telehealth: Payer: Self-pay

## 2024-09-16 NOTE — Progress Notes (Signed)
 Late Note Entry- Sep 16, 2024  Pt was d/c on Thursday. Navigator out of the office on Thurs and Fri due to holiday. Contacted [facility] home therapy dept this morning to ensure home therapy staff were aware of pt's d/c date.   Randine Mungo Dialysis Navigator [phone]

## 2024-09-16 NOTE — Patient Instructions (Signed)
 Visit Information  Thank you for taking time to visit with me today. Please don't hesitate to contact me if I can be of assistance to you   Patient instructions:   notify provider for any ongoing/ worsening symptoms.   take medications as prescribed and take antibiotic until completed.   keep follow up visits with providers.  seek emergency medical services for severe symptoms: breathing difficulty, chest pain.    Patient verbalizes understanding of instructions and care plan provided today and agrees to view in MyChart. Active MyChart status and patient understanding of how to access instructions and care plan via MyChart confirmed with patient.     The patient has been provided with contact information for the care management team and has been advised to call with any health related questions or concerns.   Please call the care guide team at [phone] if you need to cancel or reschedule your appointment.   Please call the Suicide and Crisis Lifeline: 988 call the [place]  National Suicide Prevention Lifeline: 434 517 4364 or TTY: 941-181-4452 TTY (240)539-4029) to talk to a trained counselor call 1-800-273-TALK (toll free, 24 hour hotline) if you are experiencing a Mental Health or Behavioral Health Crisis or need someone to talk to.  Arvin Seip RN, BSN, CCM [employer], Population Health Case Manager Phone: [phone]

## 2024-09-16 NOTE — Transitions of Care (Post Inpatient/ED Visit) (Signed)
 09/16/2024  Name: [name] MRN: [id number] DOB: 09/20/1966  Today's TOC FU Call Status: Today's TOC FU Call Status:: Successful TOC FU Call Completed TOC FU Call Complete Date: 09/16/24  Patient's Name and Date of Birth confirmed. Name, DOB  Transition Care Management Follow-up Telephone Call Date of Discharge: 09/12/24 Discharge Facility: [name]) Type of Discharge: Inpatient Admission Primary Inpatient Discharge Diagnosis:: C. Difficile colitis How have you been since you were released from the hospital?: Same Any questions or concerns?: No  Items Reviewed: Did you receive and understand the discharge instructions provided?: Yes Medications obtained,verified, and reconciled?: Partial Review Completed Reason for Partial Mediation Review: patient states she doesn't have the energy to review her medications. Any new allergies since your discharge?: No Dietary orders reviewed?: Yes Type of Diet Ordered:: Diet renal/carb modified with fluid restriction 1200 mL Fluid; Do you have support at home?: Yes People in Home [RPT]: child(ren), adult Name of Support/Comfort Primary Source: [name]  Medications Reviewed Today:  Patient states she did not feel like reviewing her medications during this call.  Medications Reviewed Today     Reviewed by Azariah Latendresse E, RN (Registered Nurse) on 09/16/24 at 1257  Med List Status: <None>   Medication Order Taking? Sig Documenting Provider Last Dose Status Informant  acetaminophen  (TYLENOL ) 500 MG tablet [id number] Yes Take 1,000 mg by mouth every 6 (six) hours as needed for mild pain. [provider]  Active Self, Pharmacy Records  albuterol  (VENTOLIN  HFA) 108 [phone] Base) MCG/ACT inhaler [id number] Yes INHALE 2 PUFFS INTO THE LUNGS UP TO EVERY 6HRS AS NEEDED FOR WHEEZING/SHORTNESS OF BREATH Clark, Katherine K, NP  Active Self, Pharmacy Records  allopurinol  (ZYLOPRIM ) 100 MG tablet [id number] Yes TAKE 1 TABLET (100 MG  TOTAL) BY MOUTH DAILY. FOR GOUT PREVENTION Clark, Katherine K, NP  Active Self, Pharmacy Records  AURYXIA  1 GM 210 MG(Fe) tablet [id number]  Take 210 mg by mouth 3 (three) times daily with meals.  Patient not taking: Reported on 09/07/2024   [provider]  Active Self, Pharmacy Records           Med Note Norwood, DONETA RAMAN   Sat Sep 07, 2024 11:40 PM)    busPIRone  (BUSPAR ) 5 MG tablet [id number]  TAKE 1 TABLET BY MOUTH TWICE A DAY FOR ANXIETY Clark, Katherine K, NP  Active Self, Pharmacy Records  calcitRIOL (ROCALTROL) 0.25 MCG capsule [id number]  Take 0.25 mcg by mouth 3 (three) times a week.  Patient not taking: Reported on 09/07/2024   [provider]  Active Self, Pharmacy Records  dolutegravir  ([name] ) 50 MG tablet [id number]  Take 1 tablet (50 mg total) by mouth daily. Melvenia Corean SAILOR, NP  Active Self, Pharmacy Records  emtricitabine -tenofovir  AF (DESCOVY ) 200-25 MG tablet [id number]  Take 1 tablet by mouth daily. Melvenia Corean SAILOR, NP  Active Self, Pharmacy Records  fluticasone  (FLOVENT  HFA) 110 MCG/ACT inhaler [id number]  Inhale 1 puff into the lungs 2 (two) times daily. [provider]  Active Self, Pharmacy Records           Med Note KERRIN, MELISSA R   Fri Dec 15, 2023 12:45 PM)    Lancets JANETT ULTRASOFT) lancets [id number]  Use as instructed to test blood sugar daily Gretta Comer POUR, NP  Active Self, Pharmacy Records  levocetirizine (XYZAL ) 5 MG tablet [id number]  TAKE 1 TABLET BY MOUTH EVERY DAY IN THE EVENING FOR ALLERGIES Clark, Katherine K, NP  Active Self, Pharmacy Records  lidocaine  (  LIDODERM ) 5 % [id number]  PLACE 1 PATCH ONTO THE SKIN DAILY. REMOVE & DISCARD PATCH WITHIN 12 HOURS OR AS DIRECTED BY MD  Patient not taking: Reported on 08/28/2024   Gretta Comer POUR, NP  Active Self, Pharmacy Records  methocarbamol  (ROBAXIN ) 500 MG tablet [id number]  Take 1 tablet (500 mg total) by mouth every 6 (six) hours as needed for muscle spasms. Janjua,  Rashid M, MD  Active Self, Pharmacy Records  minocycline  (MINOCIN ) 100 MG capsule [id number]  Take 1 capsule (100 mg total) by mouth 2 (two) times daily. Melvenia Corean SAILOR, NP  Active Self, Pharmacy Records  nystatin  (MYCOSTATIN ) 100000 UNIT/ML suspension [id number]  Take 5 mLs by mouth 4 (four) times daily. [provider]  Active   omeprazole  (PRILOSEC) 40 MG capsule [id number]  Take 1 capsule (40 mg total) by mouth 2 (two) times daily. for heartburn. Ghimire, Donalda HERO, MD  Active   ondansetron  (ZOFRAN -ODT) 4 MG disintegrating tablet [id number]  Take 1 tablet (4 mg total) by mouth every 8 (eight) hours as needed for nausea or vomiting. Gretta Comer POUR, NP  Active Self, Pharmacy Records  rosuvastatin  (CRESTOR ) 10 MG tablet [id number]  TAKE 1 TABLET BY MOUTH EVERY DAY FOR CHOLESTEROL Clark, Katherine K, NP  Active Self, Pharmacy Records  saxagliptin  HCl (ONGLYZA) 2.5 MG TABS tablet [id number]  TAKE 1 TABLET (2.5 MG TOTAL) BY MOUTH DAILY. FOR DIABETES. Gretta Comer POUR, NP  Active Self, Pharmacy Records  sevelamer  carbonate (RENVELA ) 800 MG tablet [id number]  Take 800 mg by mouth 3 (three) times daily with meals. [provider]  Active Self, Pharmacy Records  SYNTHROID  150 MCG tablet [id number]  TAKE 1 TABLET BY MOUTH DAILY BEFORE BREAKFAST. Trixie File, MD  Active Self, Pharmacy Records  vancomycin  (VANCOCIN ) 125 MG capsule [id number]  Take 1 capsule (125 mg total) by mouth 4 (four) times daily for 5 days. Raenelle Donalda HERO, MD  Active             Home Care and Equipment/Supplies: Were Home Health Services Ordered?: Yes Name of Home Health Agency:: [facility] Has Agency set up a time to come to your home?: Yes First Home Health Visit Date: 09/17/24 Any new equipment or medical supplies ordered?: No  Functional Questionnaire: Do you need assistance with bathing/showering or dressing?: No Do you need assistance with meal preparation?: No Do you need assistance  with eating?: No Do you have difficulty maintaining continence: No Do you need assistance with getting out of bed/getting out of a chair/moving?: No Do you have difficulty managing or taking your medications?: No  Follow up appointments reviewed: PCP Follow-up appointment confirmed?: Yes Date of PCP follow-up appointment?: 10/02/24 (patient request to keep this appointment.) Follow-up Provider: Comer Gretta Specialist [facility] Follow-up appointment confirmed?: Yes Date of Specialist follow-up appointment?: 09/26/24 Follow-Up Specialty Provider:: Dr. Loretha Do you need transportation to your follow-up appointment?: No Do you understand care options if your condition(s) worsen?: Yes-patient verbalized understanding  SDOH Interventions Today    Flowsheet Row Most Recent Value  SDOH Interventions   Food Insecurity Interventions Intervention Not Indicated  Housing Interventions Intervention Not Indicated  Transportation Interventions Intervention Not Indicated  Utilities Interventions Intervention Not Indicated    Discussed and offered 30 day TOC program.  Patient declined.  She states she will continue with ongoing follow up with longitudinal RN case manager, Mariadora weeks.   The patient has been provided with contact information for the care management team and has been advised to  call with any health -related questions or concerns.  The patient verbalized understanding with current plan of care.  The patient is directed to their insurance card regarding availability of benefits coverage.    Arvin Seip RN, BSN, CCM [employer], Population Health Case Manager Phone: [phone]

## 2024-09-18 ENCOUNTER — Telehealth: Payer: Self-pay

## 2024-09-18 NOTE — Telephone Encounter (Signed)
 Copied from CRM #[id number]. Topic: Clinical - Medication Question >> Sep 18, 2024 11:54 AM Alfonso HERO wrote: Reason for CRM: patient is needing something for a yeast infection and nausea.Patient states that the vaginal itching started 2 days ago. Denies any discharge, abdominal pain, odor or urinary symptoms. She also states that she has not had any solid food after discharge from hospital. States that she has not apatite. She is drinking ensure and water  as much as she can. Have reviewed with pcp. Per her recommendations we have set up for visit on Friday. I have reviewed all red words with patient and had repeated back to me. If any she will go to ED for evaluation.    [id number] [address], [place] - [address] [place] [address] Phone: [phone] Fax: [phone] Hours: Not open 24 hours

## 2024-09-19 NOTE — Telephone Encounter (Signed)
 Noted, will evaluate.

## 2024-09-20 ENCOUNTER — Encounter: Payer: Self-pay | Admitting: Primary Care

## 2024-09-20 ENCOUNTER — Ambulatory Visit: Payer: Self-pay | Admitting: Primary Care

## 2024-09-20 ENCOUNTER — Ambulatory Visit: Admitting: Primary Care

## 2024-09-20 VITALS — BP 118/76 | HR 63 | Temp 97.3°F | Ht 65.0 in

## 2024-09-20 DIAGNOSIS — G8929 Other chronic pain: Secondary | ICD-10-CM

## 2024-09-20 DIAGNOSIS — R630 Anorexia: Secondary | ICD-10-CM | POA: Diagnosis not present

## 2024-09-20 DIAGNOSIS — N898 Other specified noninflammatory disorders of vagina: Secondary | ICD-10-CM | POA: Diagnosis not present

## 2024-09-20 DIAGNOSIS — A498 Other bacterial infections of unspecified site: Secondary | ICD-10-CM | POA: Diagnosis not present

## 2024-09-20 DIAGNOSIS — M5441 Lumbago with sciatica, right side: Secondary | ICD-10-CM | POA: Diagnosis not present

## 2024-09-20 DIAGNOSIS — E1169 Type 2 diabetes mellitus with other specified complication: Secondary | ICD-10-CM

## 2024-09-20 DIAGNOSIS — M5442 Lumbago with sciatica, left side: Secondary | ICD-10-CM | POA: Diagnosis not present

## 2024-09-20 DIAGNOSIS — E785 Hyperlipidemia, unspecified: Secondary | ICD-10-CM | POA: Diagnosis not present

## 2024-09-20 LAB — POCT GLYCOSYLATED HEMOGLOBIN (HGB A1C): Hemoglobin A1C: 5.6 % (ref 4.0–5.6)

## 2024-09-20 MED ORDER — KETOROLAC TROMETHAMINE 60 MG/2ML IM SOLN
60.0000 mg | Freq: Once | INTRAMUSCULAR | Status: AC
  Administered 2024-09-20: 60 mg via INTRAMUSCULAR

## 2024-09-20 MED ORDER — TIZANIDINE HCL 4 MG PO TABS: 4.0000 mg | ORAL_TABLET | Freq: Three times a day (TID) | ORAL | 0 refills | Status: DC | PRN

## 2024-09-20 NOTE — Assessment & Plan Note (Signed)
 With recent hospitalization. Fortunately, diarrhea has improved.  Hospital labs, notes, imaging reviewed. Follow-up with oncology as scheduled.

## 2024-09-20 NOTE — Assessment & Plan Note (Addendum)
 Uncontrolled despite multiple efforts at treatment.  Will trial tizanidine  4 to 8 mg every 8 hours as needed.  Drowsiness precautions provided. It appears that oral NSAIDs will interact with her Descovy , will discuss with infectious disease. Toradol  60 mg provided intramuscularly today, less likely to interfere with Descovy .  I also encouraged her to schedule another appointment with her neurosurgeon.  Phone number provided.

## 2024-09-20 NOTE — Addendum Note (Signed)
 Addended by: Cecilia Nishikawa K on: 09/20/2024 01:45 PM   Modules accepted: Level of Service

## 2024-09-20 NOTE — Patient Instructions (Addendum)
 Start Tizanidine  4 mg for back pain. Take 1-2 pills every 8 hours as needed.  We will be in touch once we receive your vaginal swab results.   Please follow up with Dr. Onetha, neurosurgeon, [phone].  Nasal Congestion/Ear Pressure/Sinus Pressure: Try using Flonase  (fluticasone ) nasal spray. Instill 1 spray in each nostril twice daily.   It was a pleasure to see you today!

## 2024-09-20 NOTE — Assessment & Plan Note (Signed)
 We discussed options.  Mirtazapine is the safest option for which has been presented.  She continues to decline.  We discussed that Megace could be an option but has risks of fluid overload.  I recommend that she speak with her oncologist about this next week. Continue Ensure and oral intake as tolerated.

## 2024-09-20 NOTE — Progress Notes (Signed)
 Subjective:    Patient ID: [name], female    DOB: March 17, 1966, 58 y.o.   MRN: [id number]  [name] is a very pleasant 58 y.o. female with significant medical history including hypertension, aortic dissection, reflux, type 2 diabetes, thyroid  cancer, breast cancer, HIV, sarcoidosis, end-stage renal disease on peritoneal dialysis who presents today for hospital follow-up.  Admitted to [facility] 10/14 to 10/19 for severe headaches with nausea and vomiting and blurred vision, difficulty walking.  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   She presented to [facility] ED on 09/07/2024 for nausea, vomiting, diarrhea with decreased appetite.  Workup in the ED did not point to any specific cause so she was admitted for further evaluation.  During her hospital stay there was concern for peritoneal dialysis catheter associated peritonitis so antibiotics were provided.  Further workup revealed positive C. Difficile, other GI pathogen testing was negative.  Both CT and chest x-ray showed no changes compared to prior.  She underwent barium esophagogram which showed dysmotility.  She underwent EGD which revealed normal esophagus and stomach.  She was initiated on oral vancomycin  and was encouraged to minimize antibiotics in the future.  Her omeprazole  40 mg was increased to twice daily. Initiation of mirtazapine was discussed but she declined as her brother-in-law committed suicide after initiated on antidepressants.  She was discharged home on 09/12/2024  Since her discharge home she developed external vaginal itching 2 days ago with nausea.  She continues to experience poor appetite and has had no solid food since her discharge.  She is trying to drink Ensure and water  is much as possible.  She was told by a friend to try Megace.  She is not interested in mirtazapine.    She continues to  experience bilateral lower back pain with radiation down to her toes which is her main concern today. Her back pain is affecting her quality of life. She's having difficulty getting dressed and ADLs, cleaning her home. Her pain will take my breath away She underwent MRI lumbar spine in October 2025 per neurosurgery which did not show obvious cause. She was instructed to follow up with neurosurgery after evaluation from her vascular surgeon. She has yet to do so as she didn't realize that she was supposed to.   For her back pain She's tried OTC lidocaine  patches, Tramadol , Norco, Robaxin , Flexeril , Tylenol  without improvement, gabapentin . She's frustrated with her ongoing pain and feels that no one is helping her.  Her pain was discontinued during her last hospitalization that she is unsure why.   BP Readings from Last 3 Encounters:  09/20/24 118/76  09/12/24 110/65  08/16/24 105/67   Wt Readings from Last 3 Encounters:  09/11/24 177 lb 14.6 oz (80.7 kg)  08/28/24 178 lb (80.7 kg)  08/16/24 181 lb 3.2 oz (82.2 kg)      Review of Systems  Constitutional:  Positive for appetite change and fatigue.  Gastrointestinal:  Positive for nausea.  Genitourinary:  Negative for dysuria and vaginal discharge.       Vaginal itching  Musculoskeletal:  Positive for back pain.         Past Medical History:  Diagnosis Date   Acute pain of right shoulder due to trauma 06/08/2023   Acute pancreatitis 10/23/2021   Acute renal failure superimposed on stage 4 chronic kidney disease (HCC) 10/24/2021   Allergy    Anemia    Normocytic  Antibiotic-induced yeast infection 09/28/2020   Anxiety    Asthma    Bronchitis 2005   Bursitis of left shoulder 09/06/2019   CKD (chronic kidney disease)    Dialysis on Mon-Wed- Fri   CLASS 1-EXOPHTHALMOS-THYROTOXIC 02/08/2007   Cognitive dysfunction 02/21/2020   COVID-19 long hauler 05/11/2021   COVID-19 virus infection 04/28/2022   Diabetes mellitus without  complication (HCC)    type 2   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  Genes Analyzed (43 total): APC*, ATM*, AXIN2, BARD1, BMPR1A, BRCA1*, BRCA2*, BRIP1*, CDH1*, CDK4, CDKN2A, CHEK2*, DICER1, GALNT12, HOXB13, MEN1, MLH1*, MRE11A, MSH2*, MSH3, MSH6*, MUTYH*, NBN, NF1*, NTHL1, PALB2*, PMS2*, POLD1, POLE, PTEN*, RAD50, RAD51C*, RAD51D*, RET, SDHB, SDHD, SMAD4, SMARCA4, STK11 and TP53* (sequencing and deletion/duplication); EGFR (s   GERD 07/24/2006   GRAVE'S DISEASE 01/01/2008   History of hidradenitis suppurativa    History of kidney stones    History of thrush    HIV DISEASE 07/24/2006   dx March 05   Hyperlipidemia    HYPERTENSION 07/24/2006   Hyperthyroidism 08/2006   Grave's Disease -diffuse radiotracer uptake 08/25/06 Thyroid  scan-Cold nodule to R lower lobe of thyrorid   Ileus (HCC) 02/14/2023   Left bundle branch block (LBBB)    Malnutrition of moderate degree 02/08/2023   Menometrorrhagia    hx of   Nephrolithiasis    Panniculitis 05/12/2020   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 Completed 2 yrs Prednisone  after lung bx confirmation. No symptoms since then.   Sebaceous cyst 04/21/2020   Suppurative hidradenitis    Thyroid  cancer (HCC)    THYROID  NODULE, RIGHT 02/08/2007    Social History   Socioeconomic History   Marital status: Single    Spouse name: Not on file   Number of children: 1   Years of education: Not on file   Highest education level: Not on file  Occupational History   Occupation: Investment Banker, Corporate  Tobacco Use   Smoking status: Former    Current packs/day: 0.00    Average packs/day: 0.5 packs/day for 15.0 years (7.5 ttl pk-yrs)    Types: Cigarettes    Start date: 04/12/2017    Quit date: 2019    Years since quitting: 6.9   Smokeless tobacco: Never  Vaping Use   Vaping status: Never Used  Substance and Sexual Activity   Alcohol  use: Not Currently    Comment: occasional wine   Drug use: No   Sexual  activity: Not Currently    Birth control/protection: Post-menopausal  Other Topics Concern   Not on file  Social History Narrative   Right Handed    Lives in a two story home   Social Drivers of Health   Financial Resource Strain: Medium Risk (06/27/2024)   Overall Financial Resource Strain (CARDIA)    Difficulty of Paying Living Expenses: Somewhat hard  Food Insecurity: No Food Insecurity (09/16/2024)   Hunger Vital Sign    Worried About Running Out of Food in the Last Year: Never true    Ran Out of Food in the Last Year: Never true  Recent Concern: Food Insecurity - Food Insecurity Present (06/27/2024)   Hunger Vital Sign    Worried About Running Out of Food in the Last Year: Sometimes true    Ran Out of Food in the Last Year: Sometimes true  Transportation Needs: No Transportation Needs (09/16/2024)   PRAPARE - Administrator, Civil Service (Medical): No    Lack of Transportation  (Non-Medical): No  Physical Activity: Inactive (06/27/2024)   Exercise Vital Sign    Days of Exercise per Week: 0 days    Minutes of Exercise per Session: 0 min  Stress: Stress Concern Present (06/27/2024)   [employer] of Occupational Health - Occupational Stress Questionnaire    Feeling of Stress: To some extent  Social Connections: Unknown (06/27/2024)   Social Connection and Isolation Panel    Frequency of Communication with Friends and Family: More than three times a week    Frequency of Social Gatherings with Friends and Family: Three times a week    Attends Religious Services: More than 4 times per year    Active Member of Clubs or Organizations: No    Attends Banker Meetings: Never    Marital Status: Not on file  Intimate Partner Violence: Not At Risk (09/16/2024)   Humiliation, Afraid, Rape, and Kick questionnaire    Fear of Current or Ex-Partner: No    Emotionally Abused: No    Physically Abused: No    Sexually Abused: No    Past Surgical History:  Procedure Laterality Date   AORTIC ARCH DEBRANCHING N/A 08/25/2023   Procedure: AORTIC ARCH DEBRANCHING USING 12X8X8MM HEMASHIELD GRAFT;  Surgeon: Lucas Dorise POUR, MD;  Location: [facility] OR;  Service: Open Heart Surgery;  Laterality: N/A;  with bypasses to the innominate and left common carotid arteries   APPLICATION OF WOUND VAC N/A 01/20/2021   Procedure: APPLICATION OF WOUND VAC;  Surgeon: Lowery Estefana RAMAN, DO;  Location:  SURGERY CENTER;  Service: Plastics;  Laterality: N/A;   BREAST EXCISIONAL BIOPSY Right 04/26/2018   right axilla negative   BREAST EXCISIONAL BIOPSY Left 04/26/2018   left axilla negative   BREAST LUMPECTOMY Right 10/03/2018   malignant   BREAST LUMPECTOMY WITH RADIOACTIVE SEED AND SENTINEL LYMPH NODE BIOPSY Right 10/03/2018   Procedure: RIGHT BREAST LUMPECTOMY WITH RADIOACTIVE SEED AND SENTINEL LYMPH NODE MAPPING;  Surgeon: Vanderbilt Ned, MD;  Location: [facility] OR;  Service:  General;  Laterality: Right;   BREAST SURGERY  1997   Breast Reduction    CAPD INSERTION N/A 12/21/2023   Procedure: LAPAROSCOPIC INSERTION CONTINUOUS AMBULATORY PERITONEAL DIALYSIS  (CAPD) CATHETER; LAPAROSCOPIC OMENTUMPEXY;  Surgeon: Magda Ned SAILOR, MD;  Location: [facility];  Service: Vascular;  Laterality: N/A;   CYSTOSCOPY W/ URETERAL STENT REMOVAL  11/09/2012   Procedure: CYSTOSCOPY WITH STENT REMOVAL;  Surgeon: Ricardo Likens, MD;  Location: [facility];  Service: Urology;  Laterality: Right;   CYSTOSCOPY WITH RETROGRADE PYELOGRAM, URETEROSCOPY AND STENT PLACEMENT  11/09/2012   Procedure: CYSTOSCOPY WITH RETROGRADE PYELOGRAM, URETEROSCOPY AND STENT PLACEMENT;  Surgeon: Ricardo Likens, MD;  Location: [facility];  Service: Urology;  Laterality: Left;  LEFT URETEROSCOPY, STONE MANIPULATION, left STENT exchange    CYSTOSCOPY WITH STENT PLACEMENT  10/02/2012   Procedure: CYSTOSCOPY WITH STENT PLACEMENT;  Surgeon: Ricardo Likens, MD;  Location: [facility];  Service: Urology;  Laterality: Left;   DEBRIDEMENT AND CLOSURE WOUND N/A 01/20/2021   Procedure: Excision of abdominal wound with closure;  Surgeon: Lowery Estefana RAMAN, DO;  Location: [facility];  Service: Plastics;  Laterality: N/A;   DILATION AND CURETTAGE OF UTERUS  11/2002   s/p for 1st trimester nonviable pregnancy   ESOPHAGOGASTRODUODENOSCOPY N/A 09/11/2024   Procedure: EGD (ESOPHAGOGASTRODUODENOSCOPY);  Surgeon: Abran Norleen SAILOR, MD;  Location: [facility] ENDOSCOPY;  Service: Gastroenterology;  Laterality: N/A;   EYE SURGERY     sty under eyelid   INCISE AND DRAIN ABCESS  08/2002   s/p I &D for righ inframmary fold hidradenitis   INCISION AND DRAINAGE PERITONSILLAR ABSCESS  12/2001   IR CV LINE INJECTION  06/07/2018   IR FLUORO GUIDE CV LINE RIGHT  01/30/2023   IR IMAGING GUIDED PORT INSERTION  06/20/2018   IR REMOVAL TUN ACCESS W/ PORT W/O FL MOD SED  06/20/2018   IR US  GUIDE VASC ACCESS RIGHT  01/30/2023   IRRIGATION AND DEBRIDEMENT  ABSCESS  01/31/2012   Procedure: IRRIGATION AND DEBRIDEMENT ABSCESS;  Surgeon: Alm VEAR Angle, MD;  Location: [facility];  Service: General;  Laterality: Right;  right breast and axilla    LAPAROSCOPIC REPOSITIONING CAPD CATHETER Left 01/10/2024   Procedure: Removal of peritoneal dialysis Catheter.;  Surgeon: Magda Debby SAILOR, MD;  Location: [facility] OR;  Service: Vascular;  Laterality: Left;   LAPAROSCOPY N/A 01/10/2024   Procedure: LAPAROSCOPY, DIAGNOSTIC;  Surgeon: Magda Debby SAILOR, MD;  Location: [facility] OR;  Service: Vascular;  Laterality: N/A;   NEPHROLITHOTOMY  10/02/2012   Procedure: NEPHROLITHOTOMY PERCUTANEOUS;  Surgeon: Ricardo Likens, MD;  Location: [facility];  Service: Urology;  Laterality: Right;  First Stage Percutaneous Nephrolithotomy with Surgeon Access, Left Ureteral Stent     NEPHROLITHOTOMY  10/04/2012   Procedure: NEPHROLITHOTOMY PERCUTANEOUS SECOND LOOK;  Surgeon: Ricardo Likens, MD;  [facility]: [facility];  Service: Urology;  Laterality: Right;      NEPHROLITHOTOMY  10/08/2012   Procedure: NEPHROLITHOTOMY PERCUTANEOUS;  Surgeon: Ricardo Likens, MD;  Location: [facility];  Service: Urology;  Laterality: Right;  THIRD STAGE, nephrostomy tube exchange x 2   NEPHROLITHOTOMY  10/11/2012   Procedure: NEPHROLITHOTOMY PERCUTANEOUS SECOND LOOK;  Surgeon: Ricardo Likens, MD;  Location: [facility];  Service: Urology;  Laterality: Right;  RIGHT 4 STAGE PERCUTANOUS NEPHROLITHOTOMY, right URETEROSCOPY WITH HOLMIUM LASER    PANNICULECTOMY N/A 12/21/2020   Procedure: PANNICULECTOMY;  Surgeon: Lowery Estefana RAMAN, DO;  Location: [facility];  Service: Plastics;  Laterality: N/A;   PERCUTANEOUS NEPHROSTOLITHOTOMY  04/2022   PORT-A-CATH REMOVAL N/A 07/16/2020   Procedure: REMOVAL PORT-A-CATH;  Surgeon: Vanderbilt Debby, MD;  Location: [facility];  Service: General;  Laterality: N/A;   PORTACATH PLACEMENT Left 05/17/2018   Procedure: INSERTION PORT-A-CATH;  Surgeon: Vernetta Berg, MD;  Location:   SURGERY CENTER;  Service: General;  Laterality: Left;   RADICAL NECK DISSECTION  04/12/2017   limited/notes 04/12/2017   RADICAL NECK DISSECTION N/A 04/12/2017   Procedure: RADICAL NECK DISSECTION;  Surgeon: Carlie Carsten Carstarphen, MD;  Location: [facility];  Service: ENT;  Laterality: N/A;  limited neck dissection 2 hours total   REDUCTION MAMMAPLASTY Bilateral 1998   RIGHT/LEFT HEART CATH AND CORONARY ANGIOGRAPHY N/A 03/12/2020   Procedure: RIGHT/LEFT HEART CATH AND CORONARY ANGIOGRAPHY;  Surgeon: Cherrie Toribio SAUNDERS, MD;  [facility];  Service: Cardiovascular;  Laterality: N/A;   [place]  1994   TEE WITHOUT CARDIOVERSION N/A 08/25/2023   Procedure: TRANSESOPHAGEAL ECHOCARDIOGRAM;  Surgeon: Lucas Dorise POUR, MD;  Location: [facility];  Service: Open Heart Surgery;  Laterality: N/A;   TENCKHOFF CATHETER INSERTION Left 01/10/2024   Procedure: INSERTION, CATHETER, DIALYSIS, PERITONEAL;  Surgeon: Magda Debby SAILOR, MD;  Location: [facility];  Service: Vascular;  Laterality: Left;   THORACIC AORTIC ENDOVASCULAR STENT GRAFT N/A 02/02/2023   Procedure: THORACIC AORTIC ENDOVASCULAR STENT GRAFT;  Surgeon: Lanis Fonda BRAVO, MD;  Location: [facility] OR;  Service: Vascular;  Laterality: N/A;   THORACIC AORTIC ENDOVASCULAR STENT GRAFT N/A 08/25/2023   Procedure: THORACIC AORTIC ENDOVASCULAR STENT GRAFT;  Surgeon: Lanis Fonda BRAVO, MD;  Location: [facility] OR;  Service: Vascular;  Laterality: N/A;   THYROIDECTOMY  04/12/2017   completion/notes 04/12/2017   THYROIDECTOMY N/A 04/12/2017   Procedure: THYROIDECTOMY;  Surgeon: Carlie Callin Ashe, MD;  Location[facility] OR;  Service: ENT;  Laterality: N/A;  Completion Thyroidectomy   TOTAL THYROIDECTOMY  2010   ULTRASOUND GUIDANCE FOR VASCULAR ACCESS Right 08/25/2023   Procedure: ULTRASOUND GUIDANCE FOR VASCULAR ACCESS, RIGHT FEMORAL ARTERY;  Surgeon: Lanis Fonda BRAVO, MD;  Location: [facility] OR;  Service: Vascular;  Laterality: Right;    Family History  Problem Relation Age of Onset   Hypertension  Mother    Cancer Mother        laryngeal   Heart disease Mother        stent   Pancreatic cancer Father    Hypertension Father    Lung cancer Father 10       hx smoking   Hypertension Sister    Hypertension Sister    Hypertension Brother    Breast cancer Maternal Aunt 49   Breast cancer Maternal Aunt        dx 60+   Cancer Maternal Uncle        Lung CA   Breast cancer Paternal Aunt 60   Breast cancer Paternal Aunt        dx 40's   Breast cancer Paternal Aunt        dx 50's   Prostate cancer Paternal Uncle    Prostate cancer Paternal Uncle    Lung cancer Paternal Uncle    Breast cancer Cousin 34   Breast cancer Cousin        dx <50   Breast cancer Cousin        dx <50   Breast cancer Cousin        dx <50   Heart disease Other    Hypertension Other    Stroke Other        Grandparent   Kidney disease Other        Grandparent   Diabetes Other        FH of Diabetes   Colon cancer Neg Hx    Esophageal cancer Neg Hx    Rectal cancer Neg Hx    Stomach cancer Neg Hx     Allergies  Allergen Reactions   Genvoya [Elviteg-Cobic-Emtricit-Tenofaf] Hives   Lisinopril Cough   Aldactone  [Spironolactone ] Hives   Valium  [Diazepam ] Other (See Comments)  Lethargy (pt states she cannot take this again)   Tegaderm Ag Mesh [Silver] Itching    Current Outpatient Medications on File Prior to Visit  Medication Sig Dispense Refill   acetaminophen  (TYLENOL ) 500 MG tablet Take 1,000 mg by mouth every 6 (six) hours as needed for mild pain.     allopurinol  (ZYLOPRIM ) 100 MG tablet TAKE 1 TABLET (100 MG TOTAL) BY MOUTH DAILY. FOR GOUT PREVENTION 90 tablet 2   AURYXIA  1 GM 210 MG(Fe) tablet Take 210 mg by mouth 3 (three) times daily with meals.     busPIRone  (BUSPAR ) 5 MG tablet TAKE 1 TABLET BY MOUTH TWICE A DAY FOR ANXIETY 180 tablet 0   calcitRIOL (ROCALTROL) 0.25 MCG capsule Take 0.25 mcg by mouth 3 (three) times a week.     dolutegravir  ([name] ) 50 MG tablet Take 1 tablet (50 mg  total) by mouth daily. 30 tablet 11   emtricitabine -tenofovir  AF (DESCOVY ) 200-25 MG tablet Take 1 tablet by mouth daily. 30 tablet 11   fluticasone  (FLOVENT  HFA) 110 MCG/ACT inhaler Inhale 1 puff into the lungs 2 (two) times daily.     Lancets (ONETOUCH ULTRASOFT) lancets Use as instructed to test blood sugar daily 100 each 5   levocetirizine (XYZAL ) 5 MG tablet TAKE 1 TABLET BY MOUTH EVERY DAY IN THE EVENING FOR ALLERGIES 90 tablet 1   levothyroxine  (SYNTHROID ) 150 MCG tablet TAKE 1 TABLET BY MOUTH EVERY DAY BEFORE BREAKFAST 30 tablet 0   lidocaine  (LIDODERM ) 5 % PLACE 1 PATCH ONTO THE SKIN DAILY. REMOVE & DISCARD PATCH WITHIN 12 HOURS OR AS DIRECTED BY MD 30 patch 0   minocycline  (MINOCIN ) 100 MG capsule Take 1 capsule (100 mg total) by mouth 2 (two) times daily. 60 capsule 5   nystatin  (MYCOSTATIN ) 100000 UNIT/ML suspension Take 5 mLs by mouth 4 (four) times daily.     omeprazole  (PRILOSEC) 40 MG capsule Take 1 capsule (40 mg total) by mouth 2 (two) times daily. for heartburn. 90 capsule 2   ondansetron  (ZOFRAN -ODT) 4 MG disintegrating tablet Take 1 tablet (4 mg total) by mouth every 8 (eight) hours as needed for nausea or vomiting. 20 tablet 0   rosuvastatin  (CRESTOR ) 10 MG tablet TAKE 1 TABLET BY MOUTH EVERY DAY FOR CHOLESTEROL 90 tablet 2   saxagliptin  HCl (ONGLYZA) 2.5 MG TABS tablet TAKE 1 TABLET (2.5 MG TOTAL) BY MOUTH DAILY. FOR DIABETES. 90 tablet 0   sevelamer  carbonate (RENVELA ) 800 MG tablet Take 800 mg by mouth 3 (three) times daily with meals.     albuterol  (VENTOLIN  HFA) 108 (90 Base) MCG/ACT inhaler INHALE 2 PUFFS INTO THE LUNGS UP TO EVERY 6HRS AS NEEDED FOR WHEEZING/SHORTNESS OF BREATH (Patient not taking: Reported on 09/20/2024) 8 each 0   No current facility-administered medications on file prior to visit.    BP 118/76   Pulse 63   Temp (!) 97.3 F (36.3 C) (Temporal)   Ht 5' 5 (1.651 m)   LMP 03/31/2014 (LMP Unknown)   SpO2 100%   BMI 29.61 kg/m  Objective:    Physical Exam Constitutional:      Comments: Appears fatigued and uncomfortable  Cardiovascular:     Rate and Rhythm: Normal rate and regular rhythm.  Pulmonary:     Effort: Pulmonary effort is normal.     Breath sounds: Normal breath sounds.  Musculoskeletal:     Cervical back: Neck supple.  Skin:    General: Skin is warm and dry.  Neurological:  Mental Status: She is alert and oriented to person, place, and time.  Psychiatric:     Comments: Flat affect     Physical Exam        Assessment & Plan:  Vaginal itching Assessment & Plan: Wet prep collected and pending.  Consider clobetasol if wet prep negative for Candida.  Orders: -     WET PREP BY MOLECULAR PROBE  Chronic bilateral low back pain with bilateral sciatica Assessment & Plan: Uncontrolled despite multiple efforts at treatment.  Will trial tizanidine  4 to 8 mg every 8 hours as needed.  Drowsiness precautions provided. It appears that oral NSAIDs will interact with her Descovy , will discuss with infectious disease. Toradol  60 mg provided intramuscularly today, less likely to interfere with Descovy .  I also encouraged her to schedule another appointment with her neurosurgeon.  Phone number provided.  Orders: -     tiZANidine  HCl; Take 1-2 tablets (4-8 mg total) by mouth every 8 (eight) hours as needed for muscle spasms.  Dispense: 90 tablet; Refill: 0 -     Ketorolac  Tromethamine   Type 2 diabetes mellitus with hyperlipidemia (HCC) -     POCT glycosylated hemoglobin (Hb A1C)  Decreased appetite Assessment & Plan: We discussed options.  Mirtazapine is the safest option for which has been presented.  She continues to decline.  We discussed that Megace could be an option but has risks of fluid overload.  I recommend that she speak with her oncologist about this next week. Continue Ensure and oral intake as tolerated.   Clostridioides difficile infection Assessment & Plan: With recent  hospitalization. Fortunately, diarrhea has improved.  Hospital labs, notes, imaging reviewed. Follow-up with oncology as scheduled.     Assessment and Plan Assessment & Plan         Comer MARLA Gaskins, NP

## 2024-09-20 NOTE — Assessment & Plan Note (Signed)
 Wet prep collected and pending.  Consider clobetasol if wet prep negative for Candida.

## 2024-09-20 NOTE — Assessment & Plan Note (Signed)
 Well-controlled with A1c of 5.6 today.  Will discontinue saxagliptin , especially in the setting of poor appetite.

## 2024-09-21 ENCOUNTER — Other Ambulatory Visit

## 2024-09-23 ENCOUNTER — Inpatient Hospital Stay (HOSPITAL_COMMUNITY)
Admission: EM | Admit: 2024-09-23 | Discharge: 2024-09-27 | DRG: 811 | Disposition: A | Discharge status: Rehabilitation Facility | Attending: Internal Medicine | Admitting: Internal Medicine

## 2024-09-23 ENCOUNTER — Other Ambulatory Visit: Payer: Self-pay

## 2024-09-23 ENCOUNTER — Inpatient Hospital Stay (HOSPITAL_COMMUNITY)

## 2024-09-23 ENCOUNTER — Emergency Department (HOSPITAL_COMMUNITY)

## 2024-09-23 ENCOUNTER — Encounter (HOSPITAL_COMMUNITY): Payer: Self-pay | Admitting: Emergency Medicine

## 2024-09-23 DIAGNOSIS — I5181 Takotsubo syndrome: Secondary | ICD-10-CM | POA: Diagnosis present

## 2024-09-23 DIAGNOSIS — D72829 Elevated white blood cell count, unspecified: Secondary | ICD-10-CM | POA: Diagnosis not present

## 2024-09-23 DIAGNOSIS — Z21 Asymptomatic human immunodeficiency virus [HIV] infection status: Secondary | ICD-10-CM | POA: Diagnosis present

## 2024-09-23 DIAGNOSIS — R109 Unspecified abdominal pain: Secondary | ICD-10-CM

## 2024-09-23 DIAGNOSIS — B20 Human immunodeficiency virus [HIV] disease: Secondary | ICD-10-CM | POA: Diagnosis not present

## 2024-09-23 DIAGNOSIS — E1122 Type 2 diabetes mellitus with diabetic chronic kidney disease: Secondary | ICD-10-CM | POA: Diagnosis present

## 2024-09-23 DIAGNOSIS — K8020 Calculus of gallbladder without cholecystitis without obstruction: Secondary | ICD-10-CM | POA: Diagnosis not present

## 2024-09-23 DIAGNOSIS — D631 Anemia in chronic kidney disease: Secondary | ICD-10-CM | POA: Diagnosis present

## 2024-09-23 DIAGNOSIS — R112 Nausea with vomiting, unspecified: Secondary | ICD-10-CM | POA: Diagnosis not present

## 2024-09-23 DIAGNOSIS — N186 End stage renal disease: Secondary | ICD-10-CM | POA: Diagnosis present

## 2024-09-23 DIAGNOSIS — Z992 Dependence on renal dialysis: Secondary | ICD-10-CM | POA: Diagnosis not present

## 2024-09-23 DIAGNOSIS — I447 Left bundle-branch block, unspecified: Secondary | ICD-10-CM | POA: Diagnosis not present

## 2024-09-23 DIAGNOSIS — R579 Shock, unspecified: Principal | ICD-10-CM | POA: Diagnosis present

## 2024-09-23 DIAGNOSIS — E46 Unspecified protein-calorie malnutrition: Secondary | ICD-10-CM | POA: Diagnosis not present

## 2024-09-23 DIAGNOSIS — E1142 Type 2 diabetes mellitus with diabetic polyneuropathy: Secondary | ICD-10-CM | POA: Diagnosis not present

## 2024-09-23 DIAGNOSIS — M4726 Other spondylosis with radiculopathy, lumbar region: Secondary | ICD-10-CM | POA: Diagnosis not present

## 2024-09-23 DIAGNOSIS — I5040 Unspecified combined systolic (congestive) and diastolic (congestive) heart failure: Secondary | ICD-10-CM | POA: Diagnosis not present

## 2024-09-23 DIAGNOSIS — E8809 Other disorders of plasma-protein metabolism, not elsewhere classified: Secondary | ICD-10-CM | POA: Diagnosis present

## 2024-09-23 DIAGNOSIS — E876 Hypokalemia: Secondary | ICD-10-CM | POA: Diagnosis present

## 2024-09-23 DIAGNOSIS — R630 Anorexia: Secondary | ICD-10-CM | POA: Diagnosis not present

## 2024-09-23 DIAGNOSIS — R195 Other fecal abnormalities: Secondary | ICD-10-CM | POA: Diagnosis not present

## 2024-09-23 DIAGNOSIS — I340 Nonrheumatic mitral (valve) insufficiency: Secondary | ICD-10-CM | POA: Diagnosis not present

## 2024-09-23 DIAGNOSIS — R001 Bradycardia, unspecified: Secondary | ICD-10-CM | POA: Diagnosis not present

## 2024-09-23 DIAGNOSIS — I272 Pulmonary hypertension, unspecified: Secondary | ICD-10-CM | POA: Diagnosis present

## 2024-09-23 DIAGNOSIS — W19XXXA Unspecified fall, initial encounter: Secondary | ICD-10-CM | POA: Diagnosis present

## 2024-09-23 DIAGNOSIS — I5021 Acute systolic (congestive) heart failure: Secondary | ICD-10-CM | POA: Diagnosis not present

## 2024-09-23 DIAGNOSIS — E440 Moderate protein-calorie malnutrition: Secondary | ICD-10-CM | POA: Diagnosis present

## 2024-09-23 DIAGNOSIS — R197 Diarrhea, unspecified: Secondary | ICD-10-CM | POA: Diagnosis not present

## 2024-09-23 DIAGNOSIS — E1151 Type 2 diabetes mellitus with diabetic peripheral angiopathy without gangrene: Secondary | ICD-10-CM | POA: Diagnosis present

## 2024-09-23 DIAGNOSIS — E2740 Unspecified adrenocortical insufficiency: Secondary | ICD-10-CM | POA: Diagnosis present

## 2024-09-23 DIAGNOSIS — R609 Edema, unspecified: Secondary | ICD-10-CM | POA: Diagnosis not present

## 2024-09-23 DIAGNOSIS — D62 Acute posthemorrhagic anemia: Secondary | ICD-10-CM | POA: Diagnosis not present

## 2024-09-23 DIAGNOSIS — Z8616 Personal history of COVID-19: Secondary | ICD-10-CM | POA: Diagnosis not present

## 2024-09-23 DIAGNOSIS — E860 Dehydration: Secondary | ICD-10-CM | POA: Diagnosis present

## 2024-09-23 DIAGNOSIS — E872 Acidosis, unspecified: Secondary | ICD-10-CM | POA: Diagnosis present

## 2024-09-23 DIAGNOSIS — I132 Hypertensive heart and chronic kidney disease with heart failure and with stage 5 chronic kidney disease, or end stage renal disease: Secondary | ICD-10-CM | POA: Diagnosis present

## 2024-09-23 DIAGNOSIS — R932 Abnormal findings on diagnostic imaging of liver and biliary tract: Secondary | ICD-10-CM | POA: Diagnosis not present

## 2024-09-23 DIAGNOSIS — R578 Other shock: Secondary | ICD-10-CM | POA: Diagnosis not present

## 2024-09-23 DIAGNOSIS — E236 Other disorders of pituitary gland: Secondary | ICD-10-CM | POA: Diagnosis present

## 2024-09-23 DIAGNOSIS — G609 Hereditary and idiopathic neuropathy, unspecified: Secondary | ICD-10-CM | POA: Diagnosis not present

## 2024-09-23 DIAGNOSIS — R5381 Other malaise: Secondary | ICD-10-CM | POA: Diagnosis not present

## 2024-09-23 DIAGNOSIS — R7989 Other specified abnormal findings of blood chemistry: Secondary | ICD-10-CM | POA: Diagnosis not present

## 2024-09-23 DIAGNOSIS — Z794 Long term (current) use of insulin: Secondary | ICD-10-CM | POA: Diagnosis not present

## 2024-09-23 DIAGNOSIS — R571 Hypovolemic shock: Secondary | ICD-10-CM | POA: Diagnosis present

## 2024-09-23 DIAGNOSIS — G8929 Other chronic pain: Secondary | ICD-10-CM | POA: Diagnosis not present

## 2024-09-23 DIAGNOSIS — M545 Low back pain, unspecified: Secondary | ICD-10-CM | POA: Diagnosis not present

## 2024-09-23 DIAGNOSIS — I5041 Acute combined systolic (congestive) and diastolic (congestive) heart failure: Secondary | ICD-10-CM | POA: Diagnosis present

## 2024-09-23 DIAGNOSIS — F09 Unspecified mental disorder due to known physiological condition: Secondary | ICD-10-CM | POA: Diagnosis not present

## 2024-09-23 DIAGNOSIS — I10 Essential (primary) hypertension: Secondary | ICD-10-CM | POA: Diagnosis not present

## 2024-09-23 DIAGNOSIS — E785 Hyperlipidemia, unspecified: Secondary | ICD-10-CM | POA: Diagnosis present

## 2024-09-23 DIAGNOSIS — G959 Disease of spinal cord, unspecified: Secondary | ICD-10-CM | POA: Diagnosis not present

## 2024-09-23 DIAGNOSIS — I214 Non-ST elevation (NSTEMI) myocardial infarction: Secondary | ICD-10-CM | POA: Diagnosis not present

## 2024-09-23 DIAGNOSIS — I5020 Unspecified systolic (congestive) heart failure: Secondary | ICD-10-CM | POA: Diagnosis not present

## 2024-09-23 DIAGNOSIS — I428 Other cardiomyopathies: Secondary | ICD-10-CM | POA: Diagnosis present

## 2024-09-23 DIAGNOSIS — I7100 Dissection of unspecified site of aorta: Secondary | ICD-10-CM | POA: Diagnosis present

## 2024-09-23 HISTORY — DX: Shock, unspecified: R57.9

## 2024-09-23 LAB — GLUCOSE, CAPILLARY
Glucose-Capillary: 114 mg/dL — ABNORMAL HIGH (ref 70–99)
Glucose-Capillary: 119 mg/dL — ABNORMAL HIGH (ref 70–99)
Glucose-Capillary: 44 mg/dL — CL (ref 70–99)
Glucose-Capillary: 54 mg/dL — ABNORMAL LOW (ref 70–99)
Glucose-Capillary: 55 mg/dL — ABNORMAL LOW (ref 70–99)
Glucose-Capillary: 59 mg/dL — ABNORMAL LOW (ref 70–99)
Glucose-Capillary: 68 mg/dL — ABNORMAL LOW (ref 70–99)
Glucose-Capillary: 98 mg/dL (ref 70–99)

## 2024-09-23 LAB — I-STAT CHEM 8, ED
BUN: 57 mg/dL — ABNORMAL HIGH (ref 6–20)
Calcium, Ion: 0.8 mmol/L — CL (ref 1.15–1.40)
Chloride: 101 mmol/L (ref 98–111)
Creatinine, Ser: 14.3 mg/dL — ABNORMAL HIGH (ref 0.44–1.00)
Glucose, Bld: 134 mg/dL — ABNORMAL HIGH (ref 70–99)
HCT: 43 % (ref 36.0–46.0)
Hemoglobin: 14.6 g/dL (ref 12.0–15.0)
Potassium: 4.2 mmol/L (ref 3.5–5.1)
Sodium: 137 mmol/L (ref 135–145)
TCO2: 23 mmol/L (ref 22–32)

## 2024-09-23 LAB — I-STAT VENOUS BLOOD GAS, ED
Acid-Base Excess: 1 mmol/L (ref 0.0–2.0)
Bicarbonate: 19.6 mmol/L — ABNORMAL LOW (ref 20.0–28.0)
Calcium, Ion: 0.77 mmol/L — CL (ref 1.15–1.40)
HCT: 40 % (ref 36.0–46.0)
Hemoglobin: 13.6 g/dL (ref 12.0–15.0)
O2 Saturation: 100 %
Potassium: 3.8 mmol/L (ref 3.5–5.1)
Sodium: 134 mmol/L — ABNORMAL LOW (ref 135–145)
TCO2: 20 mmol/L — ABNORMAL LOW (ref 22–32)
pCO2, Ven: 17.9 mmHg — CL (ref 44–60)
pH, Ven: 7.648 (ref 7.25–7.43)
pO2, Ven: 193 mmHg — ABNORMAL HIGH (ref 32–45)

## 2024-09-23 LAB — TROPONIN I (HIGH SENSITIVITY)
Troponin I (High Sensitivity): 338 ng/L (ref <18)
Troponin I (High Sensitivity): 392 ng/L (ref <18)
Troponin I (High Sensitivity): 331 ng/L (ref <18)

## 2024-09-23 LAB — TYPE AND SCREEN
ABO/RH(D): O POS
Antibody Screen: NEGATIVE

## 2024-09-23 LAB — MRSA NEXT GEN BY PCR, NASAL: MRSA by PCR Next Gen: NOT DETECTED

## 2024-09-23 LAB — PROTIME-INR
INR: 1.1 (ref 0.8–1.2)
Prothrombin Time: 14.9 s (ref 11.4–15.2)

## 2024-09-23 LAB — CBC WITH DIFFERENTIAL/PLATELET
Abs Immature Granulocytes: 0.5 K/uL — ABNORMAL HIGH (ref 0.00–0.07)
Basophils Absolute: 0.1 K/uL (ref 0.0–0.1)
Basophils Relative: 2 %
Eosinophils Absolute: 0.1 K/uL (ref 0.0–0.5)
Eosinophils Relative: 2 %
HCT: 36.9 % (ref 36.0–46.0)
Hemoglobin: 11.8 g/dL — ABNORMAL LOW (ref 12.0–15.0)
Immature Granulocytes: 7 %
Lymphocytes Relative: 19 %
Lymphs Abs: 1.5 K/uL (ref 0.7–4.0)
MCH: 35.5 pg — ABNORMAL HIGH (ref 26.0–34.0)
MCHC: 32 g/dL (ref 30.0–36.0)
MCV: 111.1 fL — ABNORMAL HIGH (ref 80.0–100.0)
Monocytes Absolute: 0.5 K/uL (ref 0.1–1.0)
Monocytes Relative: 6 %
Neutro Abs: 5.1 K/uL (ref 1.7–7.7)
Neutrophils Relative %: 64 %
Platelets: 214 K/uL (ref 150–400)
RBC: 3.32 MIL/uL — ABNORMAL LOW (ref 3.87–5.11)
RDW: 15 % (ref 11.5–15.5)
Smear Review: NORMAL
WBC: 7.8 K/uL (ref 4.0–10.5)
nRBC: 3.1 % — ABNORMAL HIGH (ref 0.0–0.2)

## 2024-09-23 LAB — COMPREHENSIVE METABOLIC PANEL WITH GFR
ALT: 9 U/L (ref 0–44)
AST: 19 U/L (ref 15–41)
Albumin: 1.9 g/dL — ABNORMAL LOW (ref 3.5–5.0)
Alkaline Phosphatase: 244 U/L — ABNORMAL HIGH (ref 38–126)
Anion gap: 18 — ABNORMAL HIGH (ref 5–15)
BUN: 39 mg/dL — ABNORMAL HIGH (ref 6–20)
CO2: 25 mmol/L (ref 22–32)
Calcium: 8.1 mg/dL — ABNORMAL LOW (ref 8.9–10.3)
Chloride: 98 mmol/L (ref 98–111)
Creatinine, Ser: 14.09 mg/dL — ABNORMAL HIGH (ref 0.44–1.00)
GFR, Estimated: 3 mL/min — ABNORMAL LOW (ref >60)
Glucose, Bld: 101 mg/dL — ABNORMAL HIGH (ref 70–99)
Potassium: 2.8 mmol/L — ABNORMAL LOW (ref 3.5–5.1)
Sodium: 141 mmol/L (ref 135–145)
Total Bilirubin: 1 mg/dL (ref 0.0–1.2)
Total Protein: 6.6 g/dL (ref 6.5–8.1)

## 2024-09-23 LAB — WET PREP BY MOLECULAR PROBE
Candida species: NOT DETECTED
Gardnerella vaginalis: NOT DETECTED
MICRO NUMBER:: 17319263
SPECIMEN QUALITY:: ADEQUATE
Trichomonas vaginosis: NOT DETECTED

## 2024-09-23 LAB — TSH: TSH: 1.52 u[IU]/mL (ref 0.350–4.500)

## 2024-09-23 LAB — I-STAT CG4 LACTIC ACID, ED
Lactic Acid, Venous: 5 mmol/L (ref 0.5–1.9)
Lactic Acid, Venous: 5.1 mmol/L (ref 0.5–1.9)

## 2024-09-23 LAB — LACTIC ACID, PLASMA: Lactic Acid, Venous: 2.2 mmol/L (ref 0.5–1.9)

## 2024-09-23 LAB — AMYLASE: Amylase: 48 U/L (ref 28–100)

## 2024-09-23 LAB — CORTISOL: Cortisol, Plasma: 20 ug/dL

## 2024-09-23 LAB — LIPASE, BLOOD: Lipase: 30 U/L (ref 11–51)

## 2024-09-23 MED ORDER — SODIUM CHLORIDE 0.9 % IV SOLN
4.0000 g | Freq: Once | INTRAVENOUS | Status: AC
  Administered 2024-09-23: 4 g via INTRAVENOUS
  Filled 2024-09-23: qty 40

## 2024-09-23 MED ORDER — HEPARIN SODIUM (PORCINE) 5000 UNIT/ML IJ SOLN: 5000.0000 [IU] | Freq: Three times a day (TID) | INTRAMUSCULAR | Status: DC

## 2024-09-23 MED ORDER — SODIUM CHLORIDE 0.9 % IV BOLUS (SEPSIS)
1000.0000 mL | Freq: Once | INTRAVENOUS | Status: AC
  Administered 2024-09-23: 1000 mL via INTRAVENOUS

## 2024-09-23 MED ORDER — LEVOTHYROXINE SODIUM 75 MCG PO TABS
150.0000 ug | ORAL_TABLET | Freq: Every day | ORAL | Status: AC
  Administered 2024-09-24 – 2024-09-27 (×4): 150 ug via ORAL
  Filled 2024-09-23 (×4): qty 2

## 2024-09-23 MED ORDER — CHLORHEXIDINE GLUCONATE CLOTH 2 % EX PADS
6.0000 | MEDICATED_PAD | Freq: Every day | CUTANEOUS | Status: DC
  Administered 2024-09-23 – 2024-09-27 (×5): 6 via TOPICAL

## 2024-09-23 MED ORDER — LACTATED RINGERS IV SOLN: INTRAVENOUS | Status: DC

## 2024-09-23 MED ORDER — ACETAMINOPHEN 10 MG/ML IV SOLN
1000.0000 mg | Freq: Four times a day (QID) | INTRAVENOUS | Status: AC
  Administered 2024-09-23 – 2024-09-24 (×4): 1000 mg via INTRAVENOUS
  Filled 2024-09-23 (×4): qty 100

## 2024-09-23 MED ORDER — DELFLEX-LC/1.5% DEXTROSE 344 MOSM/L IP SOLN: INTRAPERITONEAL | Status: DC

## 2024-09-23 MED ORDER — DOLUTEGRAVIR SODIUM 50 MG PO TABS
50.0000 mg | ORAL_TABLET | Freq: Every day | ORAL | Status: DC
  Administered 2024-09-23 – 2024-09-27 (×5): 50 mg via ORAL
  Filled 2024-09-23 (×5): qty 1

## 2024-09-23 MED ORDER — BUDESONIDE 0.25 MG/2ML IN SUSP
0.2500 mg | Freq: Two times a day (BID) | RESPIRATORY_TRACT | Status: DC
  Administered 2024-09-23 – 2024-09-27 (×8): 0.25 mg via RESPIRATORY_TRACT
  Filled 2024-09-23 (×8): qty 2

## 2024-09-23 MED ORDER — EMTRICITABINE-TENOFOVIR AF 200-25 MG PO TABS
1.0000 | ORAL_TABLET | Freq: Every day | ORAL | Status: AC
  Administered 2024-09-23 – 2024-09-27 (×5): 1 via ORAL
  Filled 2024-09-23 (×5): qty 1

## 2024-09-23 MED ORDER — FENTANYL CITRATE (PF) 50 MCG/ML IJ SOSY
50.0000 ug | PREFILLED_SYRINGE | Freq: Once | INTRAMUSCULAR | Status: AC
  Administered 2024-09-23: 50 ug via INTRAVENOUS
  Filled 2024-09-23: qty 1

## 2024-09-23 MED ORDER — LORATADINE 10 MG PO TABS
10.0000 mg | ORAL_TABLET | Freq: Every day | ORAL | Status: DC
  Administered 2024-09-23 – 2024-09-26 (×4): 10 mg via ORAL
  Filled 2024-09-23 (×4): qty 1

## 2024-09-23 MED ORDER — VANCOMYCIN HCL 1500 MG/300ML IV SOLN
1500.0000 mg | Freq: Once | INTRAVENOUS | Status: AC
  Administered 2024-09-23: 1500 mg via INTRAVENOUS
  Filled 2024-09-23: qty 300

## 2024-09-23 MED ORDER — ALBUMIN HUMAN 25 % IV SOLN
25.0000 g | Freq: Four times a day (QID) | INTRAVENOUS | Status: AC
  Administered 2024-09-23: 25 g via INTRAVENOUS
  Administered 2024-09-23: 12.5 g via INTRAVENOUS
  Administered 2024-09-24 (×2): 25 g via INTRAVENOUS
  Filled 2024-09-23 (×4): qty 100

## 2024-09-23 MED ORDER — CALCIUM GLUCONATE-NACL 1-0.675 GM/50ML-% IV SOLN: 1.0000 g | Freq: Once | INTRAVENOUS | Status: DC

## 2024-09-23 MED ORDER — PIPERACILLIN-TAZOBACTAM IN DEX 2-0.25 GM/50ML IV SOLN
2.2500 g | Freq: Three times a day (TID) | INTRAVENOUS | Status: DC
  Administered 2024-09-24 – 2024-09-25 (×4): 2.25 g via INTRAVENOUS
  Filled 2024-09-23 (×5): qty 50

## 2024-09-23 MED ORDER — LEVOCETIRIZINE DIHYDROCHLORIDE 5 MG PO TABS: 5.0000 mg | ORAL_TABLET | Freq: Every evening | ORAL | Status: DC

## 2024-09-23 MED ORDER — IOHEXOL 350 MG/ML SOLN
100.0000 mL | Freq: Once | INTRAVENOUS | Status: AC | PRN
  Administered 2024-09-23: 100 mL via INTRAVENOUS

## 2024-09-23 MED ORDER — SODIUM CHLORIDE 0.9 % IV BOLUS
1500.0000 mL | Freq: Once | INTRAVENOUS | Status: AC
  Administered 2024-09-23: 1500 mL via INTRAVENOUS

## 2024-09-23 MED ORDER — NOREPINEPHRINE 4 MG/250ML-% IV SOLN
0.0000 ug/min | INTRAVENOUS | Status: DC
  Administered 2024-09-23: 2 ug/min via INTRAVENOUS
  Administered 2024-09-24: 7 ug/min via INTRAVENOUS
  Administered 2024-09-24: 8 ug/min via INTRAVENOUS
  Filled 2024-09-23 (×3): qty 250

## 2024-09-23 MED ORDER — SEVELAMER CARBONATE 800 MG PO TABS: 800.0000 mg | ORAL_TABLET | Freq: Three times a day (TID) | ORAL | Status: DC

## 2024-09-23 MED ORDER — ALLOPURINOL 100 MG PO TABS
100.0000 mg | ORAL_TABLET | Freq: Every day | ORAL | Status: DC
  Administered 2024-09-24 – 2024-09-27 (×4): 100 mg via ORAL
  Filled 2024-09-23 (×4): qty 1

## 2024-09-23 MED ORDER — VANCOMYCIN 50 MG/ML ORAL SOLUTION: 125.0000 mg | Freq: Every day | ORAL | Status: DC

## 2024-09-23 MED ORDER — HYDROMORPHONE HCL 1 MG/ML IJ SOLN
0.5000 mg | INTRAMUSCULAR | Status: DC | PRN
  Administered 2024-09-23 – 2024-09-26 (×4): 0.5 mg via INTRAVENOUS
  Filled 2024-09-23: qty 0.5
  Filled 2024-09-23 (×3): qty 1

## 2024-09-23 MED ORDER — DOCUSATE SODIUM 100 MG PO CAPS: 100.0000 mg | ORAL_CAPSULE | Freq: Two times a day (BID) | ORAL | Status: DC | PRN

## 2024-09-23 MED ORDER — BUSPIRONE HCL 10 MG PO TABS
10.0000 mg | ORAL_TABLET | Freq: Two times a day (BID) | ORAL | Status: AC | PRN
  Administered 2024-09-24 (×2): 10 mg via ORAL
  Filled 2024-09-23 (×3): qty 1

## 2024-09-23 MED ORDER — ONDANSETRON HCL 4 MG/2ML IJ SOLN
4.0000 mg | Freq: Four times a day (QID) | INTRAMUSCULAR | Status: DC | PRN
  Administered 2024-09-26 – 2024-09-27 (×2): 4 mg via INTRAVENOUS
  Filled 2024-09-23 (×2): qty 2

## 2024-09-23 MED ORDER — VANCOMYCIN HCL IN DEXTROSE 1-5 GM/200ML-% IV SOLN: 1000.0000 mg | Freq: Once | INTRAVENOUS | Status: DC

## 2024-09-23 MED ORDER — DEXTROSE 50 % IV SOLN: 12.5000 g | INTRAVENOUS | Status: AC

## 2024-09-23 MED ORDER — GENTAMICIN SULFATE 0.1 % EX CREA
1.0000 | TOPICAL_CREAM | Freq: Every day | CUTANEOUS | Status: DC
  Administered 2024-09-23 – 2024-09-27 (×5): 1 via TOPICAL
  Filled 2024-09-23 (×3): qty 15

## 2024-09-23 MED ORDER — INSULIN ASPART 100 UNIT/ML IJ SOLN
0.0000 [IU] | INTRAMUSCULAR | Status: DC
  Administered 2024-09-24: 2 [IU] via SUBCUTANEOUS
  Administered 2024-09-24: 1 [IU] via SUBCUTANEOUS
  Administered 2024-09-24 (×3): 2 [IU] via SUBCUTANEOUS
  Administered 2024-09-25 (×2): 1 [IU] via SUBCUTANEOUS
  Administered 2024-09-25 – 2024-09-26 (×2): 3 [IU] via SUBCUTANEOUS
  Administered 2024-09-27: 1 [IU] via SUBCUTANEOUS
  Administered 2024-09-27: 2 [IU] via SUBCUTANEOUS
  Filled 2024-09-23 (×2): qty 2
  Filled 2024-09-23: qty 1
  Filled 2024-09-23: qty 2
  Filled 2024-09-23 (×2): qty 1
  Filled 2024-09-23: qty 3
  Filled 2024-09-23: qty 2
  Filled 2024-09-23: qty 1
  Filled 2024-09-23: qty 3
  Filled 2024-09-23: qty 1

## 2024-09-23 MED ORDER — LIDOCAINE 5 % EX PTCH
1.0000 | MEDICATED_PATCH | CUTANEOUS | Status: DC
  Administered 2024-09-23 – 2024-09-24 (×2): 1 via TRANSDERMAL
  Filled 2024-09-23 (×7): qty 1

## 2024-09-23 MED ORDER — VANCOMYCIN VARIABLE DOSE PER UNSTABLE RENAL FUNCTION (PHARMACIST DOSING): Status: DC

## 2024-09-23 MED ORDER — CALCITRIOL 0.25 MCG PO CAPS
0.2500 ug | ORAL_CAPSULE | ORAL | Status: AC
  Administered 2024-09-23 – 2024-09-27 (×3): 0.25 ug via ORAL
  Filled 2024-09-23 (×3): qty 1

## 2024-09-23 MED ORDER — POLYETHYLENE GLYCOL 3350 17 G PO PACK: 17.0000 g | PACK | Freq: Every day | ORAL | Status: DC | PRN

## 2024-09-23 MED ORDER — POTASSIUM CHLORIDE 10 MEQ/100ML IV SOLN
10.0000 meq | INTRAVENOUS | Status: AC
  Administered 2024-09-23 (×3): 10 meq via INTRAVENOUS
  Filled 2024-09-23 (×3): qty 100

## 2024-09-23 MED ORDER — HEPARIN SODIUM (PORCINE) 1000 UNIT/ML IJ SOLN
INTRAPERITONEAL | Status: DC | PRN
  Filled 2024-09-23: qty 6000

## 2024-09-23 MED ORDER — METRONIDAZOLE 500 MG/100ML IV SOLN
500.0000 mg | Freq: Once | INTRAVENOUS | Status: AC
  Administered 2024-09-23: 500 mg via INTRAVENOUS
  Filled 2024-09-23: qty 100

## 2024-09-23 MED ORDER — SODIUM CHLORIDE 0.9 % IV SOLN
2.0000 g | Freq: Once | INTRAVENOUS | Status: AC
  Administered 2024-09-23: 2 g via INTRAVENOUS
  Filled 2024-09-23: qty 12.5

## 2024-09-23 MED ORDER — DEXTROSE 50 % IV SOLN
INTRAVENOUS | Status: AC
  Administered 2024-09-23: 12.5 g via INTRAVENOUS
  Filled 2024-09-23: qty 50

## 2024-09-23 MED ORDER — ROSUVASTATIN CALCIUM 5 MG PO TABS
10.0000 mg | ORAL_TABLET | Freq: Every day | ORAL | Status: DC
  Administered 2024-09-24 – 2024-09-27 (×4): 10 mg via ORAL
  Filled 2024-09-23 (×2): qty 1
  Filled 2024-09-23: qty 2
  Filled 2024-09-23: qty 1

## 2024-09-23 MED ORDER — ALLOPURINOL 100 MG PO TABS
100.0000 mg | ORAL_TABLET | Freq: Every day | ORAL | Status: DC
  Administered 2024-09-23: 100 mg via ORAL
  Filled 2024-09-23: qty 1

## 2024-09-23 MED ORDER — PANTOPRAZOLE SODIUM 40 MG PO TBEC
40.0000 mg | DELAYED_RELEASE_TABLET | Freq: Every day | ORAL | Status: DC
  Administered 2024-09-23 – 2024-09-26 (×4): 40 mg via ORAL
  Filled 2024-09-23 (×5): qty 1

## 2024-09-23 MED ORDER — METHOCARBAMOL 1000 MG/10ML IJ SOLN
500.0000 mg | Freq: Three times a day (TID) | INTRAMUSCULAR | Status: AC
  Administered 2024-09-23 – 2024-09-25 (×6): 500 mg via INTRAVENOUS
  Filled 2024-09-23 (×6): qty 10

## 2024-09-23 MED ORDER — FLUTICASONE PROPIONATE HFA 110 MCG/ACT IN AERO: 1.0000 | INHALATION_SPRAY | Freq: Two times a day (BID) | RESPIRATORY_TRACT | Status: DC

## 2024-09-23 MED ORDER — DEXTROSE 50 % IV SOLN
INTRAVENOUS | Status: AC
  Filled 2024-09-23: qty 50

## 2024-09-23 MED ORDER — DEXTROSE 50 % IV SOLN
12.5000 g | INTRAVENOUS | Status: AC
  Administered 2024-09-23: 12.5 g via INTRAVENOUS
  Filled 2024-09-23: qty 50

## 2024-09-23 MED ORDER — ROSUVASTATIN CALCIUM 5 MG PO TABS
10.0000 mg | ORAL_TABLET | Freq: Every day | ORAL | Status: DC
  Administered 2024-09-23: 10 mg via ORAL
  Filled 2024-09-23: qty 2

## 2024-09-23 MED ORDER — ENSURE PLUS HIGH PROTEIN PO LIQD
237.0000 mL | Freq: Two times a day (BID) | ORAL | Status: DC
  Administered 2024-09-24 – 2024-09-27 (×4): 237 mL via ORAL

## 2024-09-23 MED ORDER — LORAZEPAM 2 MG/ML IJ SOLN
0.5000 mg | INTRAMUSCULAR | Status: DC | PRN
  Administered 2024-09-23: 0.5 mg via INTRAVENOUS
  Filled 2024-09-23: qty 1

## 2024-09-23 MED ORDER — LACTATED RINGERS IV BOLUS: 1000.0000 mL | Freq: Once | INTRAVENOUS | Status: DC

## 2024-09-23 NOTE — Sepsis Progress Note (Signed)
 Elink monitoring for the code sepsis protocol.

## 2024-09-23 NOTE — Consult Note (Signed)
 Hospital Consult    Reason for Consult: Hypotension with known type B aortic repair Requesting Physician: ED MRN #:  [id number]  History of Present Illness: This is a 58 y.o. female well-known to my service having previously undergone open aortic arch deep branching, TEVAR for type B aortic dissection.  The TEVAR was stopped at the level of the celiac artery with the dissection continuing into the left external iliac artery.  The dissection has been stable.  She presents to the emergency department with hypotension and severe back pain.  Stating she is having difficulty walking.  On exam, states the pain that she is having in the back is similar to the pain she had at her initial dissection.  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  Genes Analyzed (43 total): APC*, ATM*, AXIN2, BARD1, BMPR1A,  BRCA1*, BRCA2*, BRIP1*, CDH1*, CDK4, CDKN2A, CHEK2*, DICER1, GALNT12, HOXB13, MEN1, MLH1*, MRE11A, MSH2*, MSH3, MSH6*, MUTYH*, NBN, NF1*, NTHL1, PALB2*, PMS2*, POLD1, POLE, PTEN*, RAD50, RAD51C*, RAD51D*, RET, SDHB, SDHD, SMAD4, SMARCA4, STK11 and TP53* (sequencing and deletion/duplication); EGFR (s   GERD 07/24/2006   GRAVE'S DISEASE 01/01/2008   History of hidradenitis suppurativa    History of kidney stones    History of thrush    HIV DISEASE 07/24/2006   dx March 05   Hyperlipidemia    HYPERTENSION 07/24/2006   Hyperthyroidism 08/2006   Grave's Disease -diffuse radiotracer uptake 08/25/06 Thyroid  scan-Cold nodule to R lower lobe of thyrorid   Ileus (HCC) 02/14/2023   Left bundle branch block (LBBB)    Malnutrition of moderate degree 02/08/2023   Menometrorrhagia    hx of   Nephrolithiasis    Panniculitis 05/12/2020   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 Completed 2 yrs Prednisone   after lung bx confirmation. No symptoms since then.   Sebaceous cyst 04/21/2020   Suppurative hidradenitis    Thyroid  cancer (HCC)    THYROID  NODULE, RIGHT 02/08/2007    Past Surgical History:  Procedure Laterality Date   AORTIC ARCH DEBRANCHING N/A 08/25/2023   Procedure: AORTIC ARCH DEBRANCHING USING 12X8X8MM HEMASHIELD GRAFT;  Surgeon: Lucas Dorise POUR, MD;  Location: [facility];  Service: Open Heart Surgery;  Laterality: N/A;   with bypasses to the innominate and left common carotid arteries   APPLICATION OF WOUND VAC N/A 01/20/2021   Procedure: APPLICATION OF WOUND VAC;  Surgeon: Lowery Estefana RAMAN, DO;  Location: [facility];  Service: Plastics;  Laterality: N/A;   BREAST EXCISIONAL BIOPSY Right 04/26/2018   right axilla negative   BREAST EXCISIONAL BIOPSY Left 04/26/2018   left axilla negative   BREAST LUMPECTOMY Right 10/03/2018   malignant   BREAST LUMPECTOMY WITH RADIOACTIVE SEED AND SENTINEL LYMPH NODE BIOPSY Right 10/03/2018   Procedure: RIGHT BREAST LUMPECTOMY WITH RADIOACTIVE SEED AND SENTINEL LYMPH NODE MAPPING;  Surgeon: Vanderbilt Ned, MD;  Location: [facility];  Service: General;  Laterality: Right;   BREAST SURGERY  1997   Breast Reduction    CAPD INSERTION N/A 12/21/2023   Procedure: LAPAROSCOPIC INSERTION CONTINUOUS AMBULATORY PERITONEAL DIALYSIS  (CAPD) CATHETER; LAPAROSCOPIC OMENTUMPEXY;  Surgeon: Magda Ned SAILOR, MD;  Location: [facility];  Service: Vascular;  Laterality: N/A;   CYSTOSCOPY W/ URETERAL STENT REMOVAL  11/09/2012   Procedure: CYSTOSCOPY WITH STENT REMOVAL;  Surgeon: Ricardo Likens, MD;  Location: [facility];  Service: Urology;  Laterality: Right;   CYSTOSCOPY WITH RETROGRADE PYELOGRAM, URETEROSCOPY AND STENT PLACEMENT  11/09/2012   Procedure: CYSTOSCOPY WITH RETROGRADE PYELOGRAM, URETEROSCOPY AND STENT PLACEMENT;  Surgeon: Ricardo Likens, MD;  Location: [facility];  Service: Urology;  Laterality: Left;  LEFT URETEROSCOPY, STONE MANIPULATION, left STENT exchange    CYSTOSCOPY WITH STENT PLACEMENT  10/02/2012   Procedure: CYSTOSCOPY WITH STENT PLACEMENT;  Surgeon: Ricardo Likens, MD;  Location: [facility];  Service: Urology;  Laterality: Left;   DEBRIDEMENT AND CLOSURE WOUND N/A 01/20/2021   Procedure: Excision of abdominal wound with closure;  Surgeon: Lowery Estefana RAMAN, DO;  Location: [facility];  Service: Plastics;  Laterality: N/A;   DILATION AND CURETTAGE OF UTERUS   11/2002   s/p for 1st trimester nonviable pregnancy   ESOPHAGOGASTRODUODENOSCOPY N/A 09/11/2024   Procedure: EGD (ESOPHAGOGASTRODUODENOSCOPY);  Surgeon: Abran Norleen SAILOR, MD;  Location[facility];  Service: Gastroenterology;  Laterality: N/A;   EYE SURGERY     sty under eyelid   INCISE AND DRAIN ABCESS  08/2002   s/p I &D for righ inframmary fold hidradenitis   INCISION AND DRAINAGE PERITONSILLAR ABSCESS  12/2001   IR CV LINE INJECTION  06/07/2018   IR FLUORO GUIDE CV LINE RIGHT  01/30/2023   IR IMAGING GUIDED PORT INSERTION  06/20/2018   IR REMOVAL TUN ACCESS W/ PORT W/O FL MOD SED  06/20/2018   IR US  GUIDE VASC ACCESS RIGHT  01/30/2023   IRRIGATION AND DEBRIDEMENT ABSCESS  01/31/2012   Procedure: IRRIGATION AND DEBRIDEMENT ABSCESS;  Surgeon: Alm VEAR Angle, MD;  [facility]: [facility];  Service: General;  Laterality: Right;  right breast and axilla    LAPAROSCOPIC REPOSITIONING CAPD CATHETER Left 01/10/2024   Procedure: Removal of peritoneal dialysis Catheter.;  Surgeon: Magda Ned SAILOR, MD;  Location: [facility] OR;  Service: Vascular;  Laterality: Left;   LAPAROSCOPY N/A 01/10/2024   Procedure: LAPAROSCOPY, DIAGNOSTIC;  Surgeon:  Magda Debby SAILOR, MD;  Location: [facility] OR;  Service: Vascular;  Laterality: N/A;   NEPHROLITHOTOMY  10/02/2012   Procedure: NEPHROLITHOTOMY PERCUTANEOUS;  Surgeon: Ricardo Likens, MD;  [facility]: [facility];  Service: Urology;  Laterality: Right;  First Stage Percutaneous Nephrolithotomy with Surgeon Access, Left Ureteral Stent     NEPHROLITHOTOMY  10/04/2012   Procedure: NEPHROLITHOTOMY PERCUTANEOUS SECOND LOOK;  Surgeon: Ricardo Likens, MD;  [facility];  Service: Urology;  Laterality: Right;      NEPHROLITHOTOMY  10/08/2012   Procedure: NEPHROLITHOTOMY PERCUTANEOUS;  Surgeon: Ricardo Likens, MD;  [facility];  Service: Urology;  Laterality: Right;  THIRD STAGE, nephrostomy tube exchange x 2   NEPHROLITHOTOMY  10/11/2012   Procedure: NEPHROLITHOTOMY PERCUTANEOUS SECOND  LOOK;  Surgeon: Ricardo Likens, MD;  [facility]: [facility];  Service: Urology;  Laterality: Right;  RIGHT 4 STAGE PERCUTANOUS NEPHROLITHOTOMY, right URETEROSCOPY WITH HOLMIUM LASER    PANNICULECTOMY N/A 12/21/2020   Procedure: PANNICULECTOMY;  Surgeon: Lowery Estefana RAMAN, DO;  [facility]: [facility];  Service: Plastics;  Laterality: N/A;   PERCUTANEOUS NEPHROSTOLITHOTOMY  04/2022   PORT-A-CATH REMOVAL N/A 07/16/2020   Procedure: REMOVAL PORT-A-CATH;  Surgeon: Vanderbilt Debby, MD;  Location: [facility];  Service: General;  Laterality: N/A;   PORTACATH PLACEMENT Left 05/17/2018   Procedure: INSERTION PORT-A-CATH;  Surgeon: Vernetta Berg, MD;  Location: [facility];  Service: General;  Laterality: Left;   RADICAL NECK DISSECTION  04/12/2017   limited/notes 04/12/2017   RADICAL NECK DISSECTION N/A 04/12/2017   Procedure: RADICAL NECK DISSECTION;  Surgeon: Carlie Clark, MD;  Location: [facility] OR;  Service: ENT;  Laterality: N/A;  limited neck dissection 2 hours total   REDUCTION MAMMAPLASTY Bilateral 1998   RIGHT/LEFT HEART CATH AND CORONARY ANGIOGRAPHY N/A 03/12/2020   Procedure: RIGHT/LEFT HEART CATH AND CORONARY ANGIOGRAPHY;  Surgeon: Cherrie Toribio SAUNDERS, MD;  [facility];  Service: Cardiovascular;  Laterality: N/A;   [place]  1994   TEE WITHOUT CARDIOVERSION N/A 08/25/2023   Procedure: TRANSESOPHAGEAL ECHOCARDIOGRAM;  Surgeon: Lucas Dorise POUR, MD;  [facility]: [facility];  Service: Open Heart Surgery;  Laterality: N/A;   TENCKHOFF CATHETER INSERTION Left 01/10/2024   Procedure: INSERTION, CATHETER, DIALYSIS, PERITONEAL;  Surgeon: Magda Debby SAILOR, MD;  Location: [facility];  Service: Vascular;  Laterality: Left;   THORACIC AORTIC ENDOVASCULAR STENT GRAFT N/A 02/02/2023   Procedure: THORACIC AORTIC ENDOVASCULAR STENT GRAFT;  Surgeon: Lanis Fonda BRAVO, MD;  Location: [facility] OR;  Service: Vascular;  Laterality: N/A;   THORACIC AORTIC ENDOVASCULAR STENT GRAFT N/A 08/25/2023    Procedure: THORACIC AORTIC ENDOVASCULAR STENT GRAFT;  Surgeon: Lanis Fonda BRAVO, MD;  Location: [facility] OR;  Service: Vascular;  Laterality: N/A;   THYROIDECTOMY  04/12/2017   completion/notes 04/12/2017   THYROIDECTOMY N/A 04/12/2017   Procedure: THYROIDECTOMY;  Surgeon: Carlie Clark, MD;  Location: [facility] OR;  Service: ENT;  Laterality: N/A;  Completion Thyroidectomy   TOTAL THYROIDECTOMY  2010   ULTRASOUND GUIDANCE FOR VASCULAR ACCESS Right 08/25/2023   Procedure: ULTRASOUND GUIDANCE FOR VASCULAR ACCESS, RIGHT FEMORAL ARTERY;  Surgeon: Lanis Fonda BRAVO, MD;  Location: [facility] OR;  Service: Vascular;  Laterality: Right;    Allergies  Allergen Reactions   Genvoya [Elviteg-Cobic-Emtricit-Tenofaf] Hives   Lisinopril Cough   Aldactone  [Spironolactone ] Hives   Valium  [Diazepam ] Other (See Comments)    Lethargy (pt states she cannot take this again)   Tegaderm Ag Mesh [Silver] Itching    Prior to Admission medications   Medication Sig Start Date  End Date Taking? Authorizing Provider  acetaminophen  (TYLENOL ) 500 MG tablet Take 1,000 mg by mouth every 6 (six) hours as needed for mild pain.   Yes [provider]  allopurinol  (ZYLOPRIM ) 100 MG tablet TAKE 1 TABLET (100 MG TOTAL) BY MOUTH DAILY. FOR GOUT PREVENTION 04/11/24  Yes Gretta Comer POUR, NP  aspirin  81 MG chewable tablet Chew 81 mg by mouth daily.   Yes [provider]  AURYXIA  1 GM 210 MG(Fe) tablet Take 210 mg by mouth 3 (three) times daily with meals. 04/05/23  Yes [provider]  busPIRone  (BUSPAR ) 5 MG tablet TAKE 1 TABLET BY MOUTH TWICE A DAY FOR ANXIETY 08/26/24  Yes Gretta Comer POUR, NP  dolutegravir  ([name] ) 50 MG tablet Take 1 tablet (50 mg total) by mouth daily. 05/13/24  Yes Melvenia Corean SAILOR, NP  emtricitabine -tenofovir  AF (DESCOVY ) 200-25 MG tablet Take 1 tablet by mouth daily. 05/13/24  Yes Melvenia Corean SAILOR, NP  fluticasone  (FLOVENT  HFA) 110 MCG/ACT inhaler Inhale 1 puff into the lungs 2 (two) times daily.    Yes [provider]  levocetirizine (XYZAL ) 5 MG tablet TAKE 1 TABLET BY MOUTH EVERY DAY IN THE EVENING FOR ALLERGIES 05/12/24  Yes Clark, Katherine K, NP  levothyroxine  (SYNTHROID ) 150 MCG tablet TAKE 1 TABLET BY MOUTH EVERY DAY BEFORE BREAKFAST 09/16/24  Yes Trixie File, MD  lidocaine  (LIDODERM ) 5 % PLACE 1 PATCH ONTO THE SKIN DAILY. REMOVE & DISCARD PATCH WITHIN 12 HOURS OR AS DIRECTED BY MD Patient taking differently: Place 1 patch onto the skin daily as needed. Remove & Discard patch within 12 hours or as directed by MD 04/21/24  Yes Gretta Comer POUR, NP  minocycline  (MINOCIN ) 100 MG capsule Take 1 capsule (100 mg total) by mouth 2 (two) times daily. 03/13/24  Yes Melvenia Corean SAILOR, NP  omeprazole  (PRILOSEC) 40 MG capsule Take 1 capsule (40 mg total) by mouth 2 (two) times daily. for heartburn. 09/12/24  Yes Ghimire, Donalda HERO, MD  ondansetron  (ZOFRAN -ODT) 4 MG disintegrating tablet Take 1 tablet (4 mg total) by mouth every 8 (eight) hours as needed for nausea or vomiting. 08/28/24  Yes Gretta Comer POUR, NP  rosuvastatin  (CRESTOR ) 10 MG tablet TAKE 1 TABLET BY MOUTH EVERY DAY FOR CHOLESTEROL 03/21/24  Yes Clark, Katherine K, NP  sevelamer  carbonate (RENVELA ) 800 MG tablet Take 800 mg by mouth 3 (three) times daily with meals. 08/07/24  Yes [provider]  tiZANidine  (ZANAFLEX ) 4 MG tablet Take 1-2 tablets (4-8 mg total) by mouth every 8 (eight) hours as needed for muscle spasms. 09/20/24  Yes Clark, Katherine K, NP  calcitRIOL  (ROCALTROL ) 0.25 MCG capsule Take 0.25 mcg by mouth 3 (three) times a week. Patient not taking: Reported on 09/23/2024 08/07/24   [provider]  Lancets JANETT ULTRASOFT) lancets Use as instructed to test blood sugar daily 02/15/24   Gretta Comer POUR, NP    Social History   Socioeconomic History   Marital status: Single    Spouse name: Not on file   Number of children: 1   Years of education: Not on file   Highest education  level: Not on file  Occupational History   Occupation: Investment Banker, Corporate  Tobacco Use   Smoking status: Former    Current packs/day: 0.00    Average packs/day: 0.5 packs/day for 15.0 years (7.5 ttl pk-yrs)    Types: Cigarettes    Start date: 04/12/2017    Quit date: 2019    Years since quitting: 6.9  Smokeless tobacco: Never  Vaping Use   Vaping status: Never Used  Substance and Sexual Activity   Alcohol  use: Not Currently    Comment: occasional wine   Drug use: No   Sexual activity: Not Currently    Birth control/protection: Post-menopausal  Other Topics Concern   Not on file  Social History Narrative   Right Handed    Lives in a two story home   Social Drivers of Health   Financial Resource Strain: Medium Risk (06/27/2024)   Overall Financial Resource Strain (CARDIA)    Difficulty of Paying Living Expenses: Somewhat hard  Food Insecurity: No Food Insecurity (09/16/2024)   Hunger Vital Sign    Worried About Running Out of Food in the Last Year: Never true    Ran Out of Food in the Last Year: Never true  Recent Concern: Food Insecurity - Food Insecurity Present (06/27/2024)   Hunger Vital Sign    Worried About Running Out of Food in the Last Year: Sometimes true    Ran Out of Food in the Last Year: Sometimes true  Transportation Needs: No Transportation Needs (09/16/2024)   PRAPARE - Administrator, Civil Service (Medical): No    Lack of Transportation (Non-Medical): No  Physical Activity: Inactive (06/27/2024)   Exercise Vital Sign    Days of Exercise per Week: 0 days    Minutes of Exercise per Session: 0 min  Stress: Stress Concern Present (06/27/2024)   [employer] of Occupational Health - Occupational Stress Questionnaire    Feeling of Stress: To some extent  Social Connections: Unknown (06/27/2024)   Social Connection and Isolation Panel    Frequency of Communication with Friends and Family: More than three times a week    Frequency of Social  Gatherings with Friends and Family: Three times a week    Attends Religious Services: More than 4 times per year    Active Member of Clubs or Organizations: No    Attends Banker Meetings: Never    Marital Status: Not on file  Intimate Partner Violence: Not At Risk (09/16/2024)   Humiliation, Afraid, Rape, and Kick questionnaire    Fear of Current or Ex-Partner: No    Emotionally Abused: No    Physically Abused: No    Sexually Abused: No   Family History  Problem Relation Age of Onset   Hypertension Mother    Cancer Mother        laryngeal   Heart disease Mother        stent   Pancreatic cancer Father    Hypertension Father    Lung cancer Father 39       hx smoking   Hypertension Sister    Hypertension Sister    Hypertension Brother    Breast cancer Maternal Aunt 13   Breast cancer Maternal Aunt        dx 60+   Cancer Maternal Uncle        Lung CA   Breast cancer Paternal Aunt 18   Breast cancer Paternal Aunt        dx 10's   Breast cancer Paternal Aunt        dx 50's   Prostate cancer Paternal Uncle    Prostate cancer Paternal Uncle    Lung cancer Paternal Uncle    Breast cancer Cousin 67   Breast cancer Cousin        dx <50   Breast cancer Cousin  dx <50   Breast cancer Cousin        dx <50   Heart disease Other    Hypertension Other    Stroke Other        Grandparent   Kidney disease Other        Grandparent   Diabetes Other        FH of Diabetes   Colon cancer Neg Hx    Esophageal cancer Neg Hx    Rectal cancer Neg Hx    Stomach cancer Neg Hx     ROS: Otherwise negative unless mentioned in HPI  Physical Examination  Vitals:   09/23/24 1845 09/23/24 1900  BP: (!) 81/44 (!) 80/41  Pulse:    Resp: 19 13  Temp:    SpO2:     Body mass index is 28.1 kg/m.  General: In distress Gait: Not observed HENT: WNL, normocephalic Pulmonary: Normal, nonlabored breathing Cardiac: Tachycardia Abdomen:  soft, NT/ND, no masses Skin:  without rashes Vascular Exam/Pulses: Palpable pulses bilaterally-dorsalis pedis Extremities: without ischemic changes, without Gangrene , without cellulitis; without open wounds;  Musculoskeletal: no muscle wasting or atrophy  Neurologic: A&O X 3;  No focal weakness or paresthesias are detected; speech is fluent/normal Psychiatric:  The pt has Normal affect. Lymph:  Unremarkable  CBC    Component Value Date/Time   WBC 7.8 09/23/2024 1431   RBC 3.32 (L) 09/23/2024 1431   HGB 11.8 (L) 09/23/2024 1431   HGB 11.7 (L) 07/27/2021 1110   HCT 36.9 09/23/2024 1431   PLT 214 09/23/2024 1431   PLT 228 07/27/2021 1110   MCV 111.1 (H) 09/23/2024 1431   MCH 35.5 (H) 09/23/2024 1431   MCHC 32.0 09/23/2024 1431   RDW 15.0 09/23/2024 1431   LYMPHSABS 1.5 09/23/2024 1431   MONOABS 0.5 09/23/2024 1431   EOSABS 0.1 09/23/2024 1431   BASOSABS 0.1 09/23/2024 1431    BMET    Component Value Date/Time   NA 141 09/23/2024 1431   NA 142 12/05/2022 0000   K 2.8 (L) 09/23/2024 1431   CL 98 09/23/2024 1431   CO2 25 09/23/2024 1431   GLUCOSE 101 (H) 09/23/2024 1431   BUN 39 (H) 09/23/2024 1431   BUN 44 (A) 12/05/2022 0000   CREATININE 14.09 (H) 09/23/2024 1431   CREATININE 2.72 (H) 07/27/2021 1110   CREATININE 2.74 (H) 03/17/2021 1050   CALCIUM  8.1 (L) 09/23/2024 1431   GFRNONAA 3 (L) 09/23/2024 1431   GFRNONAA 20 (L) 07/27/2021 1110   GFRAA 39 (L) 07/14/2020 1053   GFRAA 33 (L) 08/12/2019 1447    COAGS: Lab Results  Component Value Date   INR 1.1 09/23/2024   INR 1.4 (H) 08/29/2023   INR 1.4 (H) 08/28/2023    ASSESSMENT/PLAN: This is a 58 y.o. female status post aortic arch debranching with TEVAR to the level of the celiac artery.  The dissection below this level is unchanged.  Continues to have palpable pulses in the feet.  No further aneurysmal dilation from when she was scanned last.  This is not the etiology of her pain.  No plans for intervention at this time.   Fonda FORBES Rim  MD [facility] [phone] 09/23/2024  7:39 PM

## 2024-09-23 NOTE — ED Notes (Signed)
 Report attempted.  [facility] [employer] took pt up before report successfully given. Name and number provided to [facility]M for questions.

## 2024-09-23 NOTE — ED Provider Notes (Signed)
  [place] EMERGENCY DEPARTMENT AT [facility] Provider Assume Care Note I assumed care of [name] on 09/23/2024 at 3 PM from Dr. Charlyn.   Briefly, [name] is a 58 y.o. female who: PMHx: T2DM, prior aortic dissection status post endovascular repair, HIV P/w severe abdominal pain and back pain, initially found to be hypotensive, now improved after 2 L of fluid Patient also does peritoneal dialysis Was empirically activated as a code sepsis on arrival, status post cefepime /vancomycin /Flagyl , has elevated lactic acid of 5.1, however no leukocytosis.  Troponin is elevated to 338 initially  Nephrology consulted to pull peritoneal dialysis fluid cultures Critical care consulted to determine if patient is appropriate for ICU    Plan at the time of handoff: Follow-up critical care recommendations, patient may need re-triaged to lower level of care   Please refer to the original provider's note for additional information regarding the care of [facility].  Reassessment: I personally reassessed the patient: Patient resting quietly in bed  Vital Signs:  ED Triage Vitals [09/23/24 1330]  Encounter Vitals Group     BP (!) 70/18     Girls Systolic BP Percentile      Girls Diastolic BP Percentile      Boys Systolic BP Percentile      Boys Diastolic BP Percentile      Pulse      Resp (!) 28     Temp      Temp src      SpO2      Weight      Height      Head Circumference      Peak Flow      Pain Score      Pain Loc      Pain Education      Exclude from Growth Chart      Hemodynamics:  The patient has maps persistently in the 60s Mental Status:  The patient is alert  Additional MDM: Critical care did feel that patient was appropriate for their service, they placed admission orders, no additional acute events while patient was under my care.  Disposition: ADMIT: I believe the patient requires admission for further care and  management. The patient was admitted to critical care. Please see inpatient provider note for additional treatment plan details.    FREDRIK CANDIE Later, MD Emergency Medicine    Later Jerilynn RAMAN, MD 09/23/24 [phone]

## 2024-09-23 NOTE — Sepsis Progress Note (Signed)
 Notified bedside nurse of need to administer fluid bolus pt needs 2421 cc fluid and needs repeat lactic acid #3 at 1600.

## 2024-09-23 NOTE — Sepsis Progress Note (Signed)
 ICU RN notified that #3 lactic acid needs to be drawn

## 2024-09-23 NOTE — ED Triage Notes (Signed)
 Pt BIB [facility] from dialysis center for back pain/SOB.  Pt gets peritoneal dialysis. PT is alert but sleepy on arrival. EMS states they got an initial BP of 90/60 but has not been able to get another and states they cannot feel radial pulses. They also got an oxygen  reading of 93% initially but havent gotten it to pick up since.  It is not reading well in ED either. HR 80's, EMS endorses LBB and AV block on 12-lead.  CBG 112.   IO placed in L. [place], not running great and uncomfortable to pt.

## 2024-09-23 NOTE — Sepsis Progress Note (Signed)
Notified bedside nurse of need to administer antibiotics and fluid bolus.  

## 2024-09-23 NOTE — Consult Note (Signed)
 Renal Service Consult Note [facility] [name], MD  Patient: [name] Date: 09/23/2024 Requesting Physician: Dr. CHARM Sharps   Reason for Consult: ESRD pt on PD c/o  HPI: The patient is a 58 y.o. year-old w/ PMH as below who presented to ED from dialysis center for back pain/ SOB. Pt is on peritoneal dialysis. BP's difficult to get results from EMS and in ED. HR 80s, CBG 112. I/O placed in L tibia. Initial BP's were 64/30. She has been getting a 2 L bolus over 2 hours and BP's improving up to 90s-100s. Labs showed K+ 2.8, bun 39, creat 14.09, alb 1.9, lft's wnl. LA 5.1. wBC 7K, Hb 11.8. CXR showed no active disease. 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 Pt was admitted to ICU. We are asked to see for ESRD.     Pt seen in ED room. With IVFs pt is more alert per the RN.  BP's coming up from 60s/40s -> 100/ 50s. Pt describes low back pain w/ shooting pains down the R side. Has been going on for a while. Stomach a bit sore when pressed but no severe abd pain. Gets SOB off and on, not feeling SOB now. Pt states she did in center HD for 10 months then in April 2025 switched to PD. No hx peritonitis. She was just admitted 2-3 wks ago and PD fluid checked w/ no signs of peritonitis. She did her PD last night and she has been doing every night, and when she came off the fluid was clear.  The patient is struggling with chronic N/V, only thing that stays down is ensure. No confusion or jerking or extremities. Creatinine is 12 range here.   Last ECHO April 2024, LVEF 55-60%.  Mild LVH, G2DD.    ROS - denies CP, no joint pain, no HA, no blurry vision, no rash, no diarrhea   Past Medical History  Past Medical History:  Diagnosis Date   Acute pain of right shoulder due to trauma 06/08/2023    Acute pancreatitis 10/23/2021   Acute renal failure superimposed on stage 4 chronic kidney disease (HCC) 10/24/2021   Allergy    Anemia    Normocytic   Antibiotic-induced yeast infection 09/28/2020   Anxiety    Asthma    Bronchitis 2005   Bursitis of left shoulder 09/06/2019   CKD (chronic kidney disease)    Dialysis on Mon-Wed- Fri   CLASS 1-EXOPHTHALMOS-THYROTOXIC 02/08/2007   Cognitive dysfunction 02/21/2020   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  Genes Analyzed (43 total): APC*, ATM*, AXIN2, BARD1, BMPR1A, BRCA1*, BRCA2*, BRIP1*, CDH1*, CDK4, CDKN2A, CHEK2*, DICER1, GALNT12, HOXB13, MEN1, MLH1*, MRE11A, MSH2*, MSH3, MSH6*, MUTYH*, NBN, NF1*, NTHL1, PALB2*, PMS2*, POLD1, POLE,  PTEN*, RAD50, RAD51C*, RAD51D*, RET, SDHB, SDHD, SMAD4, SMARCA4, STK11 and TP53* (sequencing and deletion/duplication); EGFR (s   GERD 07/24/2006   GRAVE'S DISEASE 01/01/2008   History of hidradenitis suppurativa    History of kidney stones    History of thrush    HIV DISEASE 07/24/2006   dx March 05   Hyperlipidemia    HYPERTENSION 07/24/2006   Hyperthyroidism 08/2006   Grave's Disease -diffuse radiotracer uptake 08/25/06 Thyroid  scan-Cold nodule to R lower lobe of thyrorid   Ileus (HCC) 02/14/2023   Left bundle branch block (LBBB)    Malnutrition of moderate degree 02/08/2023   Menometrorrhagia    hx of    Nephrolithiasis    Panniculitis 05/12/2020   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 Completed 2 yrs Prednisone  after lung bx confirmation. No symptoms since then.   Sebaceous cyst 04/21/2020   Suppurative hidradenitis    Thyroid  cancer (HCC)    THYROID  NODULE, RIGHT 02/08/2007   Past Surgical History  Past Surgical History:  Procedure Laterality Date   AORTIC ARCH DEBRANCHING N/A 08/25/2023   Procedure: AORTIC ARCH DEBRANCHING USING 12X8X8MM HEMASHIELD GRAFT;  Surgeon: Lucas Dorise POUR, MD;  Location: [facility];  Service: Open Heart Surgery;  Laterality: N/A;  with bypasses to the innominate and left common carotid arteries   APPLICATION OF WOUND VAC N/A 01/20/2021   Procedure: APPLICATION OF WOUND VAC;  Surgeon: Lowery Estefana RAMAN, DO;  Location: [facility];  Service: Plastics;  Laterality: N/A;   BREAST EXCISIONAL BIOPSY Right 04/26/2018   right axilla negative   BREAST EXCISIONAL BIOPSY Left 04/26/2018   left axilla negative   BREAST LUMPECTOMY Right 10/03/2018   malignant   BREAST LUMPECTOMY WITH RADIOACTIVE SEED AND SENTINEL LYMPH NODE BIOPSY Right 10/03/2018   Procedure: RIGHT BREAST LUMPECTOMY WITH RADIOACTIVE SEED AND SENTINEL LYMPH NODE MAPPING;  Surgeon: Vanderbilt Ned, MD;  Location: [facility];  Service: General;  Laterality: Right;   BREAST SURGERY  1997   Breast Reduction    CAPD INSERTION  N/A 12/21/2023   Procedure: LAPAROSCOPIC INSERTION CONTINUOUS AMBULATORY PERITONEAL DIALYSIS  (CAPD) CATHETER; LAPAROSCOPIC OMENTUMPEXY;  Surgeon: Magda Ned SAILOR, MD;  Location: [facility];  Service: Vascular;  Laterality: N/A;   CYSTOSCOPY W/ URETERAL STENT REMOVAL  11/09/2012   Procedure: CYSTOSCOPY WITH STENT REMOVAL;  Surgeon: Ricardo Likens, MD;  Location: [facility];  Service: Urology;  Laterality: Right;   CYSTOSCOPY WITH RETROGRADE PYELOGRAM, URETEROSCOPY AND STENT PLACEMENT  11/09/2012   Procedure: CYSTOSCOPY WITH RETROGRADE PYELOGRAM, URETEROSCOPY AND STENT PLACEMENT;  Surgeon: Ricardo Likens, MD;  Location: [facility];  Service: Urology;  Laterality: Left;  LEFT URETEROSCOPY, STONE MANIPULATION, left STENT exchange    CYSTOSCOPY WITH STENT PLACEMENT  10/02/2012   Procedure: CYSTOSCOPY WITH STENT PLACEMENT;  Surgeon: Ricardo Likens, MD;  Location: [facility];  Service: Urology;  Laterality: Left;   DEBRIDEMENT AND CLOSURE WOUND N/A 01/20/2021   Procedure:  Excision of abdominal wound with closure;  Surgeon: Lowery Estefana RAMAN, DO;  Location: [facility];  Service: Plastics;  Laterality: N/A;   DILATION AND CURETTAGE OF UTERUS  11/2002   s/p for 1st trimester nonviable pregnancy   ESOPHAGOGASTRODUODENOSCOPY N/A 09/11/2024   Procedure: EGD (ESOPHAGOGASTRODUODENOSCOPY);  Surgeon: Abran Norleen SAILOR, MD;  Location: [facility] END[facility]COPY;  Service: Gastroenterology;  Laterality: N/A;   EYE SURGERY     sty under eyelid   INCISE AND DRAIN ABCESS  08/2002   s/p I &D for righ inframmary fold hidradenitis   INCISION AND DRAINAGE PERITONSILLAR ABSCESS  12/2001   IR CV LINE INJECTION  06/07/2018   IR FLUORO GUIDE CV LINE RIGHT  01/30/2023   IR IMAGING GUIDED PORT INSERTION  06/20/2018   IR REMOVAL TUN ACCESS W/ PORT W/O FL MOD SED  06/20/2018   IR US  GUIDE VASC ACCESS RIGHT  01/30/2023   IRRIGATION AND DEBRIDEMENT ABSCESS  01/31/2012   Procedure: IRRIGATION AND DEBRIDEMENT ABSCESS;  Surgeon: Alm VEAR Angle,  MD;  Location: [facility];  Service: General;  Laterality: Right;  right breast and axilla    LAPAROSCOPIC REPOSITIONING CAPD CATHETER Left 01/10/2024   Procedure: Removal of peritoneal dialysis Catheter.;  Surgeon: Magda Debby SAILOR, MD;  Location: [facility] OR;  Service: Vascular;  Laterality: Left;   LAPAROSCOPY N/A 01/10/2024   Procedure: LAPAROSCOPY, DIAGNOSTIC;  Surgeon: Magda Debby SAILOR, MD;  Location: [facility] OR;  Service: Vascular;  Laterality: N/A;   NEPHROLITHOTOMY  10/02/2012   Procedure: NEPHROLITHOTOMY PERCUTANEOUS;  Surgeon: Ricardo Likens, MD;  [facility]: [facility];  Service: Urology;  Laterality: Right;  First Stage Percutaneous Nephrolithotomy with Surgeon Access, Left Ureteral Stent     NEPHROLITHOTOMY  10/04/2012   Procedure: NEPHROLITHOTOMY PERCUTANEOUS SECOND LOOK;  Surgeon: Ricardo Likens, MD;  [facility]: [facility];  Service: Urology;  Laterality: Right;      NEPHROLITHOTOMY  10/08/2012   Procedure: NEPHROLITHOTOMY PERCUTANEOUS;  Surgeon: Ricardo Likens, MD;  [facility]: [facility];  Service: Urology;  Laterality: Right;  THIRD STAGE, nephrostomy tube exchange x 2   NEPHROLITHOTOMY  10/11/2012   Procedure: NEPHROLITHOTOMY PERCUTANEOUS SECOND LOOK;  Surgeon: Ricardo Likens, MD;  Location: [facility];  Service: Urology;  Laterality: Right;  RIGHT 4 STAGE PERCUTANOUS NEPHROLITHOTOMY, right URETEROSCOPY WITH HOLMIUM LASER    PANNICULECTOMY N/A 12/21/2020   Procedure: PANNICULECTOMY;  Surgeon: Lowery Estefana RAMAN, DO;  Location: [facility];  Service: Plastics;  Laterality: N/A;   PERCUTANEOUS NEPHROSTOLITHOTOMY  04/2022   PORT-A-CATH REMOVAL N/A 07/16/2020   Procedure: REMOVAL PORT-A-CATH;  Surgeon: Vanderbilt Debby, MD;  Location: [facility];  Service: General;  Laterality: N/A;   PORTACATH PLACEMENT Left 05/17/2018   Procedure: INSERTION PORT-A-CATH;  Surgeon: Vernetta Berg, MD;  Location: [facility];  Service: General;  Laterality: Left;   RADICAL NECK DISSECTION  04/12/2017    limited/notes 04/12/2017   RADICAL NECK DISSECTION N/A 04/12/2017   Procedure: RADICAL NECK DISSECTION;  Surgeon: Carlie Clark, MD;  [facility] OR;  Service: ENT;  Laterality: N/A;  limited neck dissection 2 hours total   REDUCTION MAMMAPLASTY Bilateral 1998   RIGHT/LEFT HEART CATH AND CORONARY ANGIOGRAPHY N/A 03/12/2020   Procedure: RIGHT/LEFT HEART CATH AND CORONARY ANGIOGRAPHY;  Surgeon: Cherrie Toribio SAUNDERS, MD;  [facility]: [facility];  Service: Cardiovascular;  Laterality: N/A;   [place]  1994   TEE WITHOUT CARDIOVERSION N/A 08/25/2023   Procedure: TRANSESOPHAGEAL ECHOCARDIOGRAM;  Surgeon: Lucas Dorise POUR, MD;  Location: [facility];  Service: Open Heart  Surgery;  Laterality: N/A;   TENCKHOFF CATHETER INSERTION Left 01/10/2024   Procedure: INSERTION, CATHETER, DIALYSIS, PERITONEAL;  Surgeon: Magda Debby SAILOR, MD;  Location: [facility];  Service: Vascular;  Laterality: Left;   THORACIC AORTIC ENDOVASCULAR STENT GRAFT N/A 02/02/2023   Procedure: THORACIC AORTIC ENDOVASCULAR STENT GRAFT;  Surgeon: Lanis Fonda BRAVO, MD;  Location: [facility] OR;  Service: Vascular;  Laterality: N/A;   THORACIC AORTIC ENDOVASCULAR STENT GRAFT N/A 08/25/2023   Procedure: THORACIC AORTIC ENDOVASCULAR STENT GRAFT;  Surgeon: Lanis Fonda BRAVO, MD;  Location: [facility] OR;  Service: Vascular;  Laterality: N/A;   THYROIDECTOMY  04/12/2017   completion/notes 04/12/2017   THYROIDECTOMY N/A 04/12/2017   Procedure: THYROIDECTOMY;  Surgeon: Carlie Clark, MD;  Location: [facility] OR;  Service: ENT;  Laterality: N/A;  Completion Thyroidectomy   TOTAL THYROIDECTOMY  2010   ULTRASOUND GUIDANCE FOR VASCULAR ACCESS Right 08/25/2023   Procedure: ULTRASOUND GUIDANCE FOR VASCULAR ACCESS, RIGHT FEMORAL ARTERY;  Surgeon: Lanis Fonda BRAVO, MD;  Location: [facility] OR;  Service: Vascular;  Laterality: Right;   Family History  Family History  Problem Relation Age of Onset   Hypertension Mother    Cancer Mother        laryngeal   Heart disease Mother        stent    Pancreatic cancer Father    Hypertension Father    Lung cancer Father 40       hx smoking   Hypertension Sister    Hypertension Sister    Hypertension Brother    Breast cancer Maternal Aunt 87   Breast cancer Maternal Aunt        dx 60+   Cancer Maternal Uncle        Lung CA   Breast cancer Paternal Aunt 17   Breast cancer Paternal Aunt        dx 15's   Breast cancer Paternal Aunt        dx 50's   Prostate cancer Paternal Uncle    Prostate cancer Paternal Uncle    Lung cancer Paternal Uncle    Breast cancer Cousin 48   Breast cancer Cousin        dx <50   Breast cancer Cousin        dx <50   Breast cancer Cousin        dx <50   Heart disease Other    Hypertension Other    Stroke Other        Grandparent   Kidney disease Other        Grandparent   Diabetes Other        FH of Diabetes   Colon cancer Neg Hx    Esophageal cancer Neg Hx    Rectal cancer Neg Hx    Stomach cancer Neg Hx    Social History  reports that she quit smoking about 6 years ago. Her smoking use included cigarettes. She started smoking about 7 years ago. She has a 7.5 pack-year smoking history. She has never used smokeless tobacco. She reports that she does not currently use alcohol . She reports that she does not use drugs. Allergies  Allergies  Allergen Reactions   Genvoya [Elviteg-Cobic-Emtricit-Tenofaf] Hives   Lisinopril Cough   Aldactone  [Spironolactone ] Hives   Valium  [Diazepam ] Other (See Comments)    Lethargy (pt states she cannot take this again)   Tegaderm Ag Mesh [Silver] Itching   Home medications Prior to Admission medications   Medication Sig Start Date End Date Taking?  Authorizing Provider  acetaminophen  (TYLENOL ) 500 MG tablet Take 1,000 mg by mouth every 6 (six) hours as needed for mild pain.    [provider]  allopurinol  (ZYLOPRIM ) 100 MG tablet TAKE 1 TABLET (100 MG TOTAL) BY MOUTH DAILY. FOR GOUT PREVENTION 04/11/24   Clark, Katherine K, NP  AURYXIA  1 GM 210 MG(Fe)  tablet Take 210 mg by mouth 3 (three) times daily with meals. 04/05/23   [provider]  busPIRone  (BUSPAR ) 5 MG tablet TAKE 1 TABLET BY MOUTH TWICE A DAY FOR ANXIETY 08/26/24   Clark, Katherine K, NP  calcitRIOL  (ROCALTROL ) 0.25 MCG capsule Take 0.25 mcg by mouth 3 (three) times a week. 08/07/24   [provider]  dolutegravir  ([name] ) 50 MG tablet Take 1 tablet (50 mg total) by mouth daily. 05/13/24   Melvenia Corean SAILOR, NP  emtricitabine -tenofovir  AF (DESCOVY ) 200-25 MG tablet Take 1 tablet by mouth daily. 05/13/24   Melvenia Corean SAILOR, NP  fluticasone  (FLOVENT  HFA) 110 MCG/ACT inhaler Inhale 1 puff into the lungs 2 (two) times daily.    [provider]  Lancets [facility] ULTRASOFT) lancets Use as instructed to test blood sugar daily 02/15/24   Clark, Katherine K, NP  levocetirizine (XYZAL ) 5 MG tablet TAKE 1 TABLET BY MOUTH EVERY DAY IN THE EVENING FOR ALLERGIES 05/12/24   Clark, Katherine K, NP  levothyroxine  (SYNTHROID ) 150 MCG tablet TAKE 1 TABLET BY MOUTH EVERY DAY BEFORE BREAKFAST 09/16/24   Trixie File, MD  lidocaine  (LIDODERM ) 5 % PLACE 1 PATCH ONTO THE SKIN DAILY. REMOVE & DISCARD PATCH WITHIN 12 HOURS OR AS DIRECTED BY MD 04/21/24   Clark, Katherine K, NP  minocycline  (MINOCIN ) 100 MG capsule Take 1 capsule (100 mg total) by mouth 2 (two) times daily. 03/13/24   Melvenia Corean SAILOR, NP  nystatin  (MYCOSTATIN ) 100000 UNIT/ML suspension Take 5 mLs by mouth 4 (four) times daily. 09/05/24   [provider]  omeprazole  (PRILOSEC) 40 MG capsule Take 1 capsule (40 mg total) by mouth 2 (two) times daily. for heartburn. 09/12/24   Ghimire, Donalda HERO, MD  ondansetron  (ZOFRAN -ODT) 4 MG disintegrating tablet Take 1 tablet (4 mg total) by mouth every 8 (eight) hours as needed for nausea or vomiting. 08/28/24   Clark, Katherine K, NP  rosuvastatin  (CRESTOR ) 10 MG tablet TAKE 1 TABLET BY MOUTH EVERY DAY FOR CHOLESTEROL 03/21/24   Clark, Katherine K, NP  sevelamer  carbonate  (RENVELA ) 800 MG tablet Take 800 mg by mouth 3 (three) times daily with meals. 08/07/24   [provider]  tiZANidine  (ZANAFLEX ) 4 MG tablet Take 1-2 tablets (4-8 mg total) by mouth every 8 (eight) hours as needed for muscle spasms. 09/20/24   Gretta Comer POUR, NP     Vitals:   09/23/24 1540 09/23/24 1547 09/23/24 1550 09/23/24 1555  BP: (!) 96/44  (!) 95/43 (!) 95/49  Pulse:  72    Resp: 12  13 (!) 23   Exam Gen alert, no distress Sclera anicteric, throat dry mouth No jvd or bruits Chest clear bilat to bases RRR no MRG Abd soft ntnd no mass or ascites +bs,  MS deformity over the R hip, painful to touch Ext no LE or UE edema, no other edema Neuro is alert, Ox 3 , nf, no asterixis    PD cath intact  Home bp meds: None    OP PD: CCPD 7x week-[facility] From 09/10/24 --> 5 exchanges, fill vol 2250, 1.5hr dwell time Usually does 1/2 1.5%  bags and 1/2 2.5% bags    Assessment/ Plan: Hypotension: significant vol depletion is likely given her exam and hx of severe chronic N/V. Improving w/ IVFs.  Back pain: not sure cause R hip deformity: ED taking a look at this, she is s/p fall Abdominal pain: not her 1st complaint to me, and abdomen is minimally tender. She did PD last night and am fluid was clear, so peritonitis is unlikely as cause of her problems. This was checked last admit and was negative. Will order cell count/ culture again but won't be done today/ tonight. Okay to cover w/ IV abx if needed.  Chronic N/V: this is a serious issue in a PD patient w/ ESRD. Will ask pharmacy if any of her medications could cause this. PD failure is a possible cause if she is uremic at creat of 14.  ESRD: on CCPD. Will write PD orders, not sure she will get PD tonight, will need to have a hospital room.  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, this is hemoconcentration from vol depletion most likely       Myer Fret  MD  [facility] 09/23/2024, 4:10 PM  Recent Labs  Lab 09/23/24 1352 09/23/24 1401 09/23/24 1431  HGB 14.6 13.6 11.8*  ALBUMIN   --   --  1.9*  CALCIUM   --   --  8.1*  CREATININE 14.30*  --  14.09*  K 4.2 3.8 2.8*   Inpatient medications:  Chlorhexidine  Gluconate Cloth  6 each Topical Daily   fentaNYL  (SUBLIMAZE ) injection  50 mcg Intravenous Once   heparin   5,000 Units Subcutaneous Q8H   insulin  aspart  0-9 Units Subcutaneous Q4H   methocarbamol  (ROBAXIN ) injection  500 mg Intravenous Q8H    acetaminophen      albumin  human     calcium  gluconate     lactated ringers      lactated ringers      metronidazole      norepinephrine  (LEVOPHED ) Adult infusion     potassium chloride      sodium chloride  1,000 mL (09/23/24 1524)   vancomycin      docusate sodium , HYDROmorphone  (DILAUDID ) injection, polyethylene glycol

## 2024-09-23 NOTE — Progress Notes (Incomplete)
 09/23/2024 Seen and examined with APP. Please see their note for full H+P  Lots of chronic issues but 2 new ones:  Low BP for past 3 days Poor PO x 1 month since pituitary apoplexy surgery TSH pending, cortisol looks okay Here hypotensive despite fluids; noted near normal Bps a week ago Noted reported C diff hx but testing/hx not c/w this Vanc/zosyn , fluids + albumin , PD per nephro, send fluid sample as able SLP re-consult, told her if caloric intake does not stay up she will end up with cortrak or even PEG MRI T and L spine nonurgent for severe ongoing back pain since aortic dissection surgery x 1 year ago Also fell and has R hip knot: limited CT imaging of this area done as part of sepsis w/o revealed no fracture, + bruising Exam she is warm and has good pulses, LBBB and inappropriate chronotropy (HR only 60s despite MAP 50s): most c/w distributive/hypovolemic Check limited echo and LE duplex to complete workup but suspect low yield  Rolan Sharps MD PCCM

## 2024-09-23 NOTE — Sepsis Progress Note (Signed)
 Notified provider of need to order fluid bolus pt needs 2421 cc fluid.

## 2024-09-23 NOTE — Progress Notes (Signed)
 Pharmacy Antibiotic Note  [name] is a 58 y.o. female for which pharmacy has been consulted for vancomycin  and zosyn  dosing. Patient with a history of ESRD on PD, T2DM, aortic dissection, HIV.  Cefepime  2g x 1 in ED  Plan: Zosyn  2.25 g q8h Vancomycin  1500mg  given once in ED -- repeat per level Monitor WBC, fever, renal function, cultures De-escalate when able F/u Nephrology plan     No data recorded.  Recent Labs  Lab 09/23/24 1352 09/23/24 1357 09/23/24 1431  WBC  --   --  7.8  CREATININE 14.30*  --  14.09*  LATICACIDVEN 5.0* 5.1*  --     Estimated Creatinine Clearance: 4.7 mL/min (A) (by C-G formula based on SCr of 14.09 mg/dL (H)).    Allergies  Allergen Reactions   Genvoya [Elviteg-Cobic-Emtricit-Tenofaf] Hives   Lisinopril Cough   Aldactone  [Spironolactone ] Hives   Valium  [Diazepam ] Other (See Comments)    Lethargy (pt states she cannot take this again)   Tegaderm Ag Mesh [Silver] Itching   Microbiology results: Pending  Thank you for allowing pharmacy to be a part of this patient's care.  Dorn Buttner, PharmD, BCPS 09/23/2024 4:09 PM ED Clinical Pharmacist -  [phone]

## 2024-09-23 NOTE — Sepsis Progress Note (Signed)
 Notified provider of need to order repeat lactic acid #3 at 1600.

## 2024-09-23 NOTE — ED Notes (Signed)
 PT is very difficult stick. 1st set of [facility] collected from central line immediately after it was started by Dr. Charlyn.  2nd set not collected.

## 2024-09-23 NOTE — H&P (Cosign Needed Addendum)
 NAME:  [name], MRN:  [id number], DOB:  1966-05-01, LOS: 0 ADMISSION DATE:  09/23/2024, CONSULTATION DATE:  09/23/2024 REFERRING MD:  Dr. Charlyn, CHIEF COMPLAINT:  abdominal and back pain   History of Present Illness:  [name] is a 58 year old female with history of ESRD on PD, HIV, aortic dissection s/p endovascular repair (08/25/2023), recent history of pituitary apoplexy, HTN, papillary thyroid  carcinoma s/p thyroidectomy, sarcoidosis, and T2DM who presented to the ED from dialysis for for back pain, abdominal pain and dyspnea. Initial blood pressure in the ED was 64/30. An I/O was placed and she received IVF resuscitation with improvement in her blood pressure. CTA CAP showed stable thoracoabdominal aortic aneurysm and dissection with no significant change in the endoluminal stent graft, no evidence of aortic leak or rupture and persistent pneumoperitoneum and pelvic free fluid in the setting of peritoneal dialysis catheter.   Interestingly she was recently admitted for similar presentation of nausea, vomiting, diarrhea and abdominal pain from 09/07/2024-09/12/2024 at which time a work up was negative for PD catheter associated peritonitis. She was treated empirically for C. Diff given recent antibiotics and C. Diff positive antigen. A subsequent C. Diff 09/08/2024 was negative.   Labs remarkable for ABG 7.64/17.9/193, K 2.8, BUN 39, Cr 14.09, alk phos 244, albumin  1.9, AST 19, ALT 9, troponin 338, lactate 5.0, Hgb 11.8, and platelets 214.   Pertinent  Medical History  ESRD on PD, anemia, anxiety, asthma, T2DM, aortic dissection s/p endovascular repair, GERD, Grave's disease, HTN, HLD, papillary thyroid  cancer s/p thyroidectomy (2018) PVD, falls, sarcoidosis, suppurative hydradenitis  Significant Hospital Events: Including procedures, antibiotic start and stop dates in addition to other pertinent events   09/23/2024 admitted to ICU for shock  Interim History / Subjective:   Admitted to ICU for shock  Objective    Blood pressure (!) 95/49, pulse 72, resp. rate (!) 23, last menstrual period 03/31/2014.       No intake or output data in the 24 hours ending 09/23/24 1623 There were no vitals filed for this visit.  Examination: General: acute on chronically ill appearing female HENT: [place]/AT, sclera anicteric, MM dry Lungs: breathing comfortably on room air, clear to auscultation bilaterally Cardiovascular: bradycardic, normal S1 and S2, no m/r/g Abdomen: soft, non-distended, mildly tender to palpation R>L, no rebound or guarding, +BS, PD catheter in place without erythema or drainage  Extremities: warm, dry, no edema Neuro: alert and responsive, answers questions appropriately, follows commands in all four extremities with 4-/5 strength in BLE GU: deferred  Resolved problem list   Assessment and Plan   Shock, hypovolemic +/- septic  Lactic acidosis CTA without clear source of infection, did note persistent pneumoperitoneum and pelvic free fluid likely related to PD catheter although abdominal exam not consistent with peritonitis. Definitely significant volume depletion given poor PO intake and persistent nausea/vomiting at home.  - Continue IVF resuscitation with 25 gm 25% albumin  q6h x 4 doses - Continue vanc and zosyn   - Follow-up blood cultures - NE as needed to maintain MAP>65 or SBP>90 - Will send cell count/culture from PD catheter  - Continue to trend lactate  Bradycardia Elevated troponin Elevated troponin likely due to demand ischemia. ECG sinus rhythm, LBBB (similar to prior).  - Will order Echo - Trend troponin  Acute low back pain, unclear etiology - has been present since aortic dissection repair - Obtain MRI spine - Multimodal pain control with scheduled IV APAP and robaxin , PRN IV HM  History of  Type B aortic dissection s/p TEVAR and aortic debranching (08/25/2023) Neurovascular exam intact. CTA with stable dissection and  endoluminal stent graft, no leak or rupture.   ESRD Hypokalemia Hypocalcemia Query if chronic nausea/vomiting is related to PD failure.  - Nephro consulted, appreciate recommendations  - No acute indication for dialysis at this time, may need CRRT if remains hypotensive requiring pressors  - Gentle potassium supplementation - Give calcium  gluconate  - Continue home calcitriol  - Most recent phos low, holding home sevelamer  for now - check phos tomorrow  Chronic nausea/vomiting  Malnutrition Decreased appetite Hypoalbuminemia - Consult RD and SLP - May need Cortrak and tube feeds if not getting adequate PO intake +/- appetite stimulant - PRN Zofran , may need to schedule  History of HIV CD4 445 on 10/15.  - Continue home HAART   Anemia of chronic disease No active signs of bleeding. - Continue to monitor daily CBC  History of pituitary apoplexy History of papillary thyroid  cancer s/p thyroidectomy - Check pituitary labs - Check TSH - Continue home Synthroid    History of T2DM - SSI  Deconditioning - Will need PT/OT when clinically stable   GI prophylaxis: continue formulary alternative home PPI VTE prophylaxis: SCDs only for now  Labs   CBC: Recent Labs  Lab 09/23/24 1352 09/23/24 1401 09/23/24 1431  WBC  --   --  7.8  NEUTROABS  --   --  5.1  HGB 14.6 13.6 11.8*  HCT 43.0 40.0 36.9  MCV  --   --  111.1*  PLT  --   --  214    Basic Metabolic Panel: Recent Labs  Lab 09/23/24 1352 09/23/24 1401 09/23/24 1431  NA 137 134* 141  K 4.2 3.8 2.8*  CL 101  --  98  CO2  --   --  25  GLUCOSE 134*  --  101*  BUN 57*  --  39*  CREATININE 14.30*  --  14.09*  CALCIUM   --   --  8.1*   GFR: Estimated Creatinine Clearance: 4.7 mL/min (A) (by C-G formula based on SCr of 14.09 mg/dL (H)). Recent Labs  Lab 09/23/24 1352 09/23/24 1357 09/23/24 1431  WBC  --   --  7.8  LATICACIDVEN 5.0* 5.1*  --     Liver Function Tests: Recent Labs  Lab 09/23/24 1431   AST 19  ALT 9  ALKPHOS 244*  BILITOT 1.0  PROT 6.6  ALBUMIN  1.9*   No results for input(s): LIPASE, AMYLASE in the last 168 hours. No results for input(s): AMMONIA in the last 168 hours.  ABG    Component Value Date/Time   PHART 7.376 08/26/2023 1412   PCO2ART 35.7 08/26/2023 1412   PO2ART 65 (L) 08/26/2023 1412   HCO3 19.6 (L) 09/23/2024 1401   TCO2 20 (L) 09/23/2024 1401   ACIDBASEDEF 4.0 (H) 08/26/2023 1412   O2SAT 100 09/23/2024 1401     Coagulation Profile: Recent Labs  Lab 09/23/24 1431  INR 1.1    Cardiac Enzymes: No results for input(s): CKTOTAL, CKMB, CKMBINDEX, TROPONINI in the last 168 hours.  HbA1C: Hemoglobin A1C  Date/Time Value Ref Range Status  09/20/2024 01:35 PM 5.6 4.0 - 5.6 % Final  01/11/2024 01:20 PM 5.8 (A) 4.0 - 5.6 % Final   Hgb A1c MFr Bld  Date/Time Value Ref Range Status  07/31/2024 07:15 PM 6.1 (H) 4.8 - 5.6 % Final    Comment:    (NOTE) Diagnosis of Diabetes The following HbA1c ranges  recommended by the American Diabetes Association (ADA) may be used as an aid in the diagnosis of diabetes mellitus.  Hemoglobin             Suggested A1C NGSP%              Diagnosis  <5.7                   Non Diabetic  5.7-6.4                Pre-Diabetic  >6.4                   Diabetic  <7.0                   Glycemic control for                       adults with diabetes.    08/23/2023 11:13 AM 5.2 4.8 - 5.6 % Final    Comment:    (NOTE) Pre diabetes:          5.7%-6.4%  Diabetes:              >6.4%  Glycemic control for   <7.0% adults with diabetes     CBG: No results for input(s): GLUCAP in the last 168 hours.  Review of Systems:   Review of Systems  Constitutional:  Positive for chills, malaise/fatigue and weight loss.  HENT: Negative.    Respiratory:  Negative for shortness of breath.   Cardiovascular:  Negative for leg swelling.  Gastrointestinal:  Positive for abdominal pain, diarrhea, melena and  nausea.  Musculoskeletal:  Positive for back pain and falls.  Neurological:  Positive for dizziness.   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), COVID-19 virus infection (04/28/2022), Diabetes mellitus without complication (HCC), Dissecting abdominal aortic aneurysm (HCC) (02/05/2023), DVT (deep venous thrombosis) (HCC) (08/29/2023), DVT of upper extremity (deep vein thrombosis) (HCC) (09/03/2023), Dysphagia (07/23/2019), Encephalopathy acute (02/02/2023), Family history of breast cancer, Family history of lung cancer, Family history of prostate cancer, Gastroenteritis (07/10/2007), Genetic testing (07/25/2018), GERD (07/24/2006), GRAVE'S DISEASE (01/01/2008), History of hidradenitis suppurativa, History of kidney stones, History of thrush, HIV DISEASE (07/24/2006), Hyperlipidemia, HYPERTENSION (07/24/2006), Hyperthyroidism (08/2006), Ileus (HCC) (02/14/2023), Left bundle branch block (LBBB), Malnutrition of moderate degree (02/08/2023), Menometrorrhagia, Nephrolithiasis, Panniculitis (05/12/2020), Papillary adenocarcinoma of thyroid  (HCC), Peripheral vascular disease, Personal history of chemotherapy, Personal history of radiation therapy, Pneumonia (2005), Port-A-Cath in place (07/12/2018), 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   Surgical History:   Past Surgical History:  Procedure Laterality Date   AORTIC ARCH DEBRANCHING N/A 08/25/2023   Procedure: AORTIC ARCH DEBRANCHING USING 12X8X8MM HEMASHIELD GRAFT;  Surgeon: Lucas Dorise POUR, MD;  Location: [facility];  Service: Open Heart Surgery;  Laterality: N/A;  with bypasses to the innominate and left common carotid arteries   APPLICATION OF WOUND VAC N/A 01/20/2021   Procedure: APPLICATION OF WOUND VAC;  Surgeon: Lowery Estefana RAMAN, DO;  Location: [facility];  Service: Government Social Research Officer;  Laterality: N/A;   BREAST EXCISIONAL BIOPSY Right 04/26/2018   right axilla negative   BREAST EXCISIONAL BIOPSY Left 04/26/2018   left axilla negative   BREAST LUMPECTOMY Right 10/03/2018   malignant   BREAST LUMPECTOMY WITH RADIOACTIVE SEED AND SENTINEL LYMPH NODE BIOPSY Right 10/03/2018   Procedure: RIGHT BREAST LUMPECTOMY WITH RADIOACTIVE SEED AND SENTINEL LYMPH NODE MAPPING;  Surgeon: Vanderbilt Ned, MD;  Location: [facility];  Service: General;  Laterality: Right;   BREAST SURGERY  1997   Breast Reduction    CAPD INSERTION N/A 12/21/2023   Procedure: LAPAROSCOPIC INSERTION CONTINUOUS AMBULATORY PERITONEAL DIALYSIS  (CAPD) CATHETER; LAPAROSCOPIC OMENTUMPEXY;  Surgeon: Magda Ned SAILOR, MD;  Location: [facility];  Service: Vascular;  Laterality: N/A;   CYSTOSCOPY W/ URETERAL STENT REMOVAL  11/09/2012   Procedure: CYSTOSCOPY WITH STENT REMOVAL;  Surgeon: Ricardo Likens, MD;  Location: [facility];  Service: Urology;  Laterality: Right;   CYSTOSCOPY WITH RETROGRADE PYELOGRAM, URETEROSCOPY AND STENT PLACEMENT  11/09/2012   Procedure: CYSTOSCOPY WITH RETROGRADE PYELOGRAM, URETEROSCOPY AND STENT PLACEMENT;  Surgeon: Ricardo Likens, MD;  Location: [facility];  Service: Urology;  Laterality: Left;  LEFT URETEROSCOPY, STONE MANIPULATION, left STENT exchange    CYSTOSCOPY WITH STENT PLACEMENT  10/02/2012   Procedure: CYSTOSCOPY WITH STENT PLACEMENT;   Surgeon: Ricardo Likens, MD;  Location: [facility];  Service: Urology;  Laterality: Left;   DEBRIDEMENT AND CLOSURE WOUND N/A 01/20/2021   Procedure: Excision of abdominal wound with closure;  Surgeon: Lowery Estefana RAMAN, DO;  Location: [facility];  Service: Plastics;  Laterality: N/A;   DILATION AND CURETTAGE OF UTERUS  11/2002   s/p for 1st trimester nonviable pregnancy   ESOPHAGOGASTRODUODENOSCOPY N/A 09/11/2024   Procedure: EGD (ESOPHAGOGASTRODUODENOSCOPY);  Surgeon: Abran Norleen SAILOR, MD;  Location: [facility];  Service: Gastroenterology;  Laterality: N/A;   EYE SURGERY     sty under eyelid   INCISE AND DRAIN ABCESS  08/2002   s/p I &D for righ inframmary fold hidradenitis   INCISION AND DRAINAGE PERITONSILLAR ABSCESS  12/2001   IR CV LINE INJECTION  06/07/2018   IR FLUORO GUIDE CV LINE RIGHT  01/30/2023   IR IMAGING GUIDED PORT INSERTION  06/20/2018   IR REMOVAL TUN ACCESS W/ PORT W/O FL MOD SED  06/20/2018   IR US  GUIDE VASC ACCESS RIGHT  01/30/2023   IRRIGATION AND DEBRIDEMENT ABSCESS  01/31/2012   Procedure: IRRIGATION AND DEBRIDEMENT ABSCESS;  Surgeon: Alm VEAR Angle, MD;  Location: [facility];  Service: General;  Laterality: Right;  right breast and axilla    LAPAROSCOPIC REPOSITIONING CAPD CATHETER Left 01/10/2024   Procedure: Removal of peritoneal dialysis Catheter.;  Surgeon: Magda Ned SAILOR, MD;  Location: [facility] OR;  Service: Vascular;  Laterality: Left;   LAPAROSCOPY N/A 01/10/2024   Procedure: LAPAROSCOPY, DIAGNOSTIC;  Surgeon: Magda Ned SAILOR, MD;  Location: [facility] OR;  Service: Vascular;  Laterality: N/A;   NEPHROLITHOTOMY  10/02/2012   Procedure: NEPHROLITHOTOMY PERCUTANEOUS;  Surgeon: Ricardo Likens, MD;  Location: [facility];  Service: Urology;  Laterality: Right;  First Stage Percutaneous Nephrolithotomy with Surgeon Access, Left Ureteral Stent     NEPHROLITHOTOMY  10/04/2012   Procedure: NEPHROLITHOTOMY PERCUTANEOUS SECOND LOOK;  Surgeon: Ricardo Likens, MD;  [facility]: [facility];  Service: Urology;  Laterality: Right;      NEPHROLITHOTOMY  10/08/2012   Procedure: NEPHROLITHOTOMY PERCUTANEOUS;  Surgeon: Ricardo Likens, MD;  Location: [facility];  Service: Urology;  Laterality: Right;  THIRD STAGE, nephrostomy tube  exchange x 2   NEPHROLITHOTOMY  10/11/2012   Procedure: NEPHROLITHOTOMY PERCUTANEOUS SECOND LOOK;  Surgeon: Ricardo Likens, MD;  Location: [facility];  Service: Urology;  Laterality: Right;  RIGHT 4 STAGE PERCUTANOUS NEPHROLITHOTOMY, right URETEROSCOPY WITH HOLMIUM LASER    PANNICULECTOMY N/A 12/21/2020   Procedure: PANNICULECTOMY;  Surgeon: Lowery Estefana RAMAN, DO;  Location: [facility];  Service: Plastics;  Laterality: N/A;   PERCUTANEOUS NEPHROSTOLITHOTOMY  04/2022   PORT-A-CATH REMOVAL N/A 07/16/2020   Procedure: REMOVAL PORT-A-CATH;  Surgeon: Vanderbilt Ned, MD;  Location: [facility];  Service: General;  Laterality: N/A;   PORTACATH PLACEMENT Left 05/17/2018   Procedure: INSERTION PORT-A-CATH;  Surgeon: Vernetta Berg, MD;  Location: [facility];  Service: General;  Laterality: Left;   RADICAL NECK DISSECTION  04/12/2017   limited/notes 04/12/2017   RADICAL NECK DISSECTION N/A 04/12/2017   Procedure: RADICAL NECK DISSECTION;  Surgeon: Carlie Clark, MD;  Location: [facility] OR;  Service: ENT;  Laterality: N/A;  limited neck dissection 2 hours total   REDUCTION MAMMAPLASTY Bilateral 1998   RIGHT/LEFT HEART CATH AND CORONARY ANGIOGRAPHY N/A 03/12/2020   Procedure: RIGHT/LEFT HEART CATH AND CORONARY ANGIOGRAPHY;  Surgeon: Cherrie Toribio SAUNDERS, MD;  [facility]: [facility];  Service: Cardiovascular;  Laterality: N/A;   [place]  1994   TEE WITHOUT CARDIOVERSION N/A 08/25/2023   Procedure: TRANSESOPHAGEAL ECHOCARDIOGRAM;  Surgeon: Lucas Dorise POUR, MD;  Location: [facility];  Service: Open Heart Surgery;  Laterality: N/A;   TENCKHOFF CATHETER INSERTION Left 01/10/2024   Procedure: INSERTION, CATHETER, DIALYSIS, PERITONEAL;  Surgeon: Magda Ned SAILOR, MD;  Location: [facility];  Service: Vascular;  Laterality: Left;   THORACIC AORTIC ENDOVASCULAR STENT GRAFT N/A 02/02/2023   Procedure: THORACIC AORTIC ENDOVASCULAR STENT GRAFT;  Surgeon: Lanis Fonda BRAVO, MD;  Location: [facility] OR;  Service: Vascular;  Laterality: N/A;   THORACIC AORTIC ENDOVASCULAR STENT GRAFT N/A 08/25/2023   Procedure: THORACIC AORTIC ENDOVASCULAR STENT GRAFT;  Surgeon: Lanis Fonda BRAVO, MD;  Location: [facility] OR;  Service: Vascular;  Laterality: N/A;   THYROIDECTOMY  04/12/2017   completion/notes 04/12/2017   THYROIDECTOMY N/A 04/12/2017   Procedure: THYROIDECTOMY;  Surgeon: Carlie Clark, MD;  Location: [facility] OR;  Service: ENT;  Laterality: N/A;  Completion Thyroidectomy   TOTAL THYROIDECTOMY  2010   ULTRASOUND GUIDANCE FOR VASCULAR ACCESS Right 08/25/2023   Procedure: ULTRASOUND GUIDANCE FOR VASCULAR ACCESS, RIGHT FEMORAL ARTERY;  Surgeon: Lanis Fonda BRAVO, MD;  Location: [facility] OR;  Service: Vascular;  Laterality: Right;     Social History:   reports that she quit smoking about 6 years ago. Her smoking use included cigarettes. She started smoking about 7 years ago. She has a 7.5 pack-year smoking history. She has never used smokeless tobacco. She reports that she does not currently use alcohol . She reports that she does not use drugs.   Family History:  Her family history includes Breast cancer in her cousin, cousin, cousin, maternal aunt, paternal aunt, and paternal aunt; Breast cancer (age of onset: 77) in her cousin; Breast cancer (age of onset: 52) in her paternal aunt; Breast cancer (age of onset: 42) in her maternal aunt; Cancer in her maternal uncle and mother; Diabetes in an other family member; Heart disease in her mother and another family member; Hypertension in her brother, father, mother, sister, sister, and another family member; Kidney disease in an other family member; Lung cancer in her paternal uncle; Lung cancer (age of onset: 50) in her father; Pancreatic cancer in her  father; Prostate cancer in her paternal uncle and paternal uncle; Stroke in an other family member. There is no history of Colon cancer, Esophageal cancer, Rectal cancer, or Stomach cancer.   Allergies Allergies  Allergen Reactions   Genvoya [Elviteg-Cobic-Emtricit-Tenofaf] Hives   Lisinopril Cough   Aldactone  [Spironolactone ] Hives   Valium  [Diazepam ] Other (See Comments)    Lethargy (pt states she cannot take this again)   Tegaderm Ag Mesh [Silver] Itching     Home Medications  Prior to Admission medications   Medication Sig Start Date End Date Taking? Authorizing Provider  acetaminophen  (TYLENOL ) 500 MG tablet Take 1,000 mg by mouth every 6 (six) hours as needed for mild pain.    [provider]  allopurinol  (ZYLOPRIM ) 100 MG tablet TAKE 1 TABLET (100 MG TOTAL) BY MOUTH DAILY. FOR GOUT PREVENTION 04/11/24   Clark, Katherine K, NP  AURYXIA  1 GM 210 MG(Fe) tablet Take 210 mg by mouth 3 (three) times daily with meals. 04/05/23   [provider]  busPIRone  (BUSPAR ) 5 MG tablet TAKE 1 TABLET BY MOUTH TWICE A DAY FOR ANXIETY 08/26/24   Gretta Comer POUR, NP  calcitRIOL  (ROCALTROL ) 0.25 MCG capsule Take 0.25 mcg by mouth 3 (three) times a week. 08/07/24   [provider]  dolutegravir  ([name] ) 50 MG tablet Take 1 tablet (50 mg total) by mouth daily. 05/13/24   Melvenia Corean SAILOR, NP  emtricitabine -tenofovir  AF (DESCOVY ) 200-25 MG tablet Take 1 tablet by mouth daily. 05/13/24   Melvenia Corean SAILOR, NP  fluticasone  (FLOVENT  HFA) 110 MCG/ACT inhaler Inhale 1 puff into the lungs 2 (two) times daily.    [provider]  Lancets [facility] ULTRASOFT) lancets Use as instructed to test blood sugar daily 02/15/24   Clark, Katherine K, NP  levocetirizine (XYZAL ) 5 MG tablet TAKE 1 TABLET BY MOUTH EVERY DAY IN THE EVENING FOR ALLERGIES 05/12/24   Clark, Katherine K, NP  levothyroxine  (SYNTHROID ) 150 MCG tablet TAKE 1 TABLET BY MOUTH EVERY DAY BEFORE BREAKFAST 09/16/24    Trixie File, MD  lidocaine  (LIDODERM ) 5 % PLACE 1 PATCH ONTO THE SKIN DAILY. REMOVE & DISCARD PATCH WITHIN 12 HOURS OR AS DIRECTED BY MD 04/21/24   Gretta Comer POUR, NP  minocycline  (MINOCIN ) 100 MG capsule Take 1 capsule (100 mg total) by mouth 2 (two) times daily. 03/13/24   Melvenia Corean SAILOR, NP  nystatin  (MYCOSTATIN ) 100000 UNIT/ML suspension Take 5 mLs by mouth 4 (four) times daily. 09/05/24   [provider]  omeprazole  (PRILOSEC) 40 MG capsule Take 1 capsule (40 mg total) by mouth 2 (two) times daily. for heartburn. 09/12/24   Ghimire, Donalda HERO, MD  ondansetron  (ZOFRAN -ODT) 4 MG disintegrating tablet Take 1 tablet (4 mg total) by mouth every 8 (eight) hours as needed for nausea or vomiting. 08/28/24   Clark, Katherine K, NP  rosuvastatin  (CRESTOR ) 10 MG tablet TAKE 1 TABLET BY MOUTH EVERY DAY FOR CHOLESTEROL 03/21/24   Clark, Katherine K, NP  sevelamer  carbonate (RENVELA ) 800 MG tablet Take 800 mg by mouth 3 (three) times daily with meals. 08/07/24   [provider]  tiZANidine  (ZANAFLEX ) 4 MG tablet Take 1-2 tablets (4-8 mg total) by mouth every 8 (eight) hours as needed for muscle spasms. 09/20/24   Gretta Comer POUR, NP     Critical care time: 55 minutes    The patient is critically ill with multiple organ system failure and requires high complexity decision making for assessment and support, frequent evaluation and titration of therapies,  advanced monitoring, review of radiographic studies and interpretation of complex data.   Critical Care Time devoted to patient care services, exclusive of separately billable procedures, described in this note is 55 minutes.  Rexene LOISE Blush, PA-C [facility] 09/23/24 4:51 PM  Please see [email or web address] for pager details.  From 7A-7P if no response, please call [phone] After hours, please call ELink [phone]

## 2024-09-23 NOTE — Sepsis Progress Note (Signed)
Notified bedside nurse of need to draw repeat lactic acid (#3). 

## 2024-09-23 NOTE — ED Provider Notes (Signed)
 [facility] AT [facility] Provider Note   CSN: [id number] Arrival date & time: 09/23/24  1326     Patient presents with: hypotension/ams   [name] is a 58 y.o. female.   HPI     58 year old patient comes in with chief complaint of low blood pressure and severe abdominal pain, back pain.  Patient has history of diabetes, aortic dissection status post endovascular repair, HIV.  Patient reports that she has severe back pain, that is similar to the pain she had when she had dissection.  The pain however has been present for a long duration, it is she has intense today.  The pain was so bad, that she could not breathe and she called EMS.  Additionally patient reports that she is having abdominal pain which is new.  Abdominal pain has been present for the last 2 days, and is generalized.  Patient gets PD including a session yesterday.  She reports that in general she has not been eating and drinking well for several weeks.   Patient denies any fevers, chills, nausea, vomiting.  Prior to Admission medications   Medication Sig Start Date End Date Taking? Authorizing Provider  acetaminophen  (TYLENOL ) 500 MG tablet Take 1,000 mg by mouth every 6 (six) hours as needed for mild pain.    [provider]  allopurinol  (ZYLOPRIM ) 100 MG tablet TAKE 1 TABLET (100 MG TOTAL) BY MOUTH DAILY. FOR GOUT PREVENTION 04/11/24   Clark, Katherine K, NP  AURYXIA  1 GM 210 MG(Fe) tablet Take 210 mg by mouth 3 (three) times daily with meals. 04/05/23   [provider]  busPIRone  (BUSPAR ) 5 MG tablet TAKE 1 TABLET BY MOUTH TWICE A DAY FOR ANXIETY 08/26/24   Clark, Katherine K, NP  calcitRIOL  (ROCALTROL ) 0.25 MCG capsule Take 0.25 mcg by mouth 3 (three) times a week. 08/07/24   [provider]  dolutegravir  ([name] ) 50 MG tablet Take 1 tablet (50 mg total) by mouth daily. 05/13/24   Melvenia Corean SAILOR, NP  emtricitabine -tenofovir  AF (DESCOVY )  200-25 MG tablet Take 1 tablet by mouth daily. 05/13/24   Melvenia Corean SAILOR, NP  fluticasone  (FLOVENT  HFA) 110 MCG/ACT inhaler Inhale 1 puff into the lungs 2 (two) times daily.    [provider]  Lancets [facility] ULTRASOFT) lancets Use as instructed to test blood sugar daily 02/15/24   Clark, Katherine K, NP  levocetirizine (XYZAL ) 5 MG tablet TAKE 1 TABLET BY MOUTH EVERY DAY IN THE EVENING FOR ALLERGIES 05/12/24   Clark, Katherine K, NP  levothyroxine  (SYNTHROID ) 150 MCG tablet TAKE 1 TABLET BY MOUTH EVERY DAY BEFORE BREAKFAST 09/16/24   Trixie File, MD  lidocaine  (LIDODERM ) 5 % PLACE 1 PATCH ONTO THE SKIN DAILY. REMOVE & DISCARD PATCH WITHIN 12 HOURS OR AS DIRECTED BY MD 04/21/24   Clark, Katherine K, NP  minocycline  (MINOCIN ) 100 MG capsule Take 1 capsule (100 mg total) by mouth 2 (two) times daily. 03/13/24   Melvenia Corean SAILOR, NP  nystatin  (MYCOSTATIN ) 100000 UNIT/ML suspension Take 5 mLs by mouth 4 (four) times daily. 09/05/24   [provider]  omeprazole  (PRILOSEC) 40 MG capsule Take 1 capsule (40 mg total) by mouth 2 (two) times daily. for heartburn. 09/12/24   Ghimire, Donalda HERO, MD  ondansetron  (ZOFRAN -ODT) 4 MG disintegrating tablet Take 1 tablet (4 mg total) by mouth every 8 (eight) hours as needed for nausea or vomiting. 08/28/24   Gretta Comer POUR, NP  rosuvastatin  (CRESTOR ) 10 MG tablet  TAKE 1 TABLET BY MOUTH EVERY DAY FOR CHOLESTEROL 03/21/24   Clark, Katherine K, NP  sevelamer  carbonate (RENVELA ) 800 MG tablet Take 800 mg by mouth 3 (three) times daily with meals. 08/07/24   [provider]  tiZANidine  (ZANAFLEX ) 4 MG tablet Take 1-2 tablets (4-8 mg total) by mouth every 8 (eight) hours as needed for muscle spasms. 09/20/24   Gretta Comer POUR, NP    Allergies: Genvoya [elviteg-cobic-emtricit-tenofaf], Lisinopril, Aldactone  [spironolactone ], Valium  [diazepam ], and Tegaderm ag mesh [silver]    Review of Systems  All other systems reviewed and are  negative.   Updated Vital Signs BP (!) 96/44   Pulse 72   Resp 12   LMP 03/31/2014 (LMP Unknown)   Physical Exam Vitals and nursing note reviewed.  Constitutional:      Appearance: She is well-developed. She is ill-appearing. She is not diaphoretic.  HENT:     Head: Atraumatic.  Cardiovascular:     Rate and Rhythm: Normal rate.  Pulmonary:     Effort: Pulmonary effort is normal.  Abdominal:     General: There is distension.     Tenderness: There is abdominal tenderness. There is guarding. There is no rebound.  Musculoskeletal:        General: No deformity.     Cervical back: Neck supple.  Skin:    General: Skin is warm and dry.  Neurological:     Mental Status: She is alert and oriented to person, place, and time.     (all labs ordered are listed, but only abnormal results are displayed) Labs Reviewed  COMPREHENSIVE METABOLIC PANEL WITH GFR - Abnormal; Notable for the following components:      Result Value   Potassium 2.8 (*)    Glucose, Bld 101 (*)    BUN 39 (*)    Creatinine, Ser 14.09 (*)    Calcium  8.1 (*)    Albumin  1.9 (*)    Alkaline Phosphatase 244 (*)    GFR, Estimated 3 (*)    Anion gap 18 (*)    All other components within normal limits  CBC WITH DIFFERENTIAL/PLATELET - Abnormal; Notable for the following components:   RBC 3.32 (*)    Hemoglobin 11.8 (*)    MCV 111.1 (*)    MCH 35.5 (*)    nRBC 3.1 (*)    Abs Immature Granulocytes 0.50 (*)    All other components within normal limits  I-STAT CG4 LACTIC ACID, ED - Abnormal; Notable for the following components:   Lactic Acid, Venous 5.0 (*)    All other components within normal limits  I-STAT CHEM 8, ED - Abnormal; Notable for the following components:   BUN 57 (*)    Creatinine, Ser 14.30 (*)    Glucose, Bld 134 (*)    Calcium , Ion 0.80 (*)    All other components within normal limits  I-STAT VENOUS BLOOD GAS, ED - Abnormal; Notable for the following components:   pH, Ven 7.648 (*)    pCO2,  Ven 17.9 (*)    pO2, Ven 193 (*)    Bicarbonate 19.6 (*)    TCO2 20 (*)    Sodium 134 (*)    Calcium , Ion 0.77 (*)    All other components within normal limits  I-STAT CG4 LACTIC ACID, ED - Abnormal; Notable for the following components:   Lactic Acid, Venous 5.1 (*)    All other components within normal limits  TROPONIN I (HIGH SENSITIVITY) - Abnormal; Notable for the following  components:   Troponin I (High Sensitivity) 338 (*)    All other components within normal limits  RESP PANEL BY RT-PCR (RSV, FLU A&B, COVID)  RVPGX2  CULTURE, BLOOD (ROUTINE X 2)  CULTURE, BLOOD (ROUTINE X 2)  BODY FLUID CULTURE W GRAM STAIN  PROTIME-INR  LACTATE DEHYDROGENASE, PLEURAL OR PERITONEAL FLUID  GLUCOSE, PLEURAL OR PERITONEAL FLUID  PROTEIN, PLEURAL OR PERITONEAL FLUID  ALBUMIN , PLEURAL OR PERITONEAL FLUID   I-STAT CG4 LACTIC ACID, ED  TYPE AND SCREEN  TROPONIN I (HIGH SENSITIVITY)    EKG: EKG Interpretation Date/Time:  Monday September 23 2024 13:30:58 EST Ventricular Rate:  81 PR Interval:  166 QRS Duration:  175 QT Interval:  475 QTC Calculation: 552 R Axis:   -57  Text Interpretation: Sinus rhythm Left bundle branch block No acute changes Nonspecific ST and T wave abnormality No significant change since last tracing Confirmed by Charlyn Sora [phone]) on 09/23/2024 3:44:10 PM  Radiology: CT Angio Chest/Abd/Pel for Dissection W and/or Wo Contrast Result Date: 09/23/2024 CLINICAL DATA:  Acute aortic syndrome suspected, hypotension, altered level of consciousness EXAM: CT ANGIOGRAPHY CHEST, ABDOMEN AND PELVIS TECHNIQUE: Non-contrast CT of the chest was initially obtained. Multidetector CT imaging through the chest, abdomen and pelvis was performed using the standard protocol during bolus administration of intravenous contrast. Multiplanar reconstructed images and MIPs were obtained and reviewed to evaluate the vascular anatomy. RADIATION DOSE REDUCTION: This exam was performed according to  the departmental dose-optimization program which includes automated exposure control, adjustment of the mA and/or kV according to patient size and/or use of iterative reconstruction technique. 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 The ascending thoracic aorta measures up to 3.0 cm, mid aortic arch measures up to 4.4 cm, and descending thoracic aorta measures up to 5.0 cm in maximal dimension, similar to prior study. The graft extends to the level of the diaphragmatic hiatus, with no evidence of endoleak involving the thoracic aorta. The common trunk of the great vessels off the anterior margin of the ascending thoracic aorta is again widely patent, and the visualized portions of the great vessels are patent. The heart is unremarkable without pericardial effusion. Timing of contrast bolus limits evaluation of the pulmonary vasculature. Mediastinum/Nodes: Prior thyroidectomy. Trachea and esophagus appear unremarkable. Lungs/Pleura: Stable areas of scarring and subsegmental atelectasis, primarily at the lung bases. No acute airspace disease, effusion, or pneumothorax. Musculoskeletal: No acute or destructive bony abnormalities. Reconstructed images demonstrate no additional findings. Review of the MIP images confirms the above findings. CTA ABDOMEN AND PELVIS FINDINGS VASCULAR Aorta: The endoluminal stent graft terminates at the level of the diaphragmatic hiatus. Fusiform abdominal aortic aneurysm is seen throughout the entirety of the abdominal aorta, measuring up to 4.3 cm at the level of the celiac axis, similar to prior study. Significant thrombus within the false lumen of the known abdominal aortic dissection is again seen, with perfusion likely due to small fenestrations as well as vascular  supply from small lumbar arteries. This is similar to prior exam allowing for differences in the timing of the contrast bolus. No evidence of aortic leak or rupture. Celiac: Patent without evidence of aneurysm, dissection, vasculitis or significant stenosis. Vessel arises from the true lumen. SMA: Patent without evidence of aneurysm, dissection, vasculitis or significant stenosis. Vessel arises from the true lumen. Renals: There is diffuse atherosclerosis of the  bilateral renal arteries, with stable moderate stenosis of the proximal renal arteries bilaterally estimated at 50-70%. No aneurysm, dissection, or vasculitis. IMA: Patent without evidence of aneurysm, dissection, vasculitis or significant stenosis. Arises from the false lumen. Inflow: The aortic dissection extends into the left common iliac and external iliac arteries, terminating at the left common femoral artery. This is similar to previous exam. The thrombosed false lumen of the left external iliac artery results in significant stenosis, estimated 70-90%. The right common iliac, external iliac, and internal iliac vessels are widely patent. Veins: No obvious venous abnormality within the limitations of this arterial phase study. Review of the MIP images confirms the above findings. NON-VASCULAR Hepatobiliary: No focal liver abnormality is seen. No gallstones, gallbladder wall thickening, or biliary dilatation. Pancreas: Unremarkable. No pancreatic ductal dilatation or surrounding inflammatory changes. Spleen: Normal in size without focal abnormality. Adrenals/Urinary Tract: Stable thickening the adrenal glands. Bilateral renal cortical atrophy. Nonobstructing 8 mm calculus lower pole right kidney. Remaining calcifications at the renal hila are felt to be vascular. No obstructive uropathy. Bladder is decompressed. Stomach/Bowel: No bowel obstruction or ileus. Normal appendix right lower quadrant. No bowel wall thickening or inflammatory change. Lymphatic:  No pathologic adenopathy. Reproductive: Uterus and bilateral adnexa are unremarkable. Other: Small amount of pelvic free fluid is well as trace pneumoperitoneum within the right upper quadrant are likely secondary to indwelling peritoneal dialysis catheter, which is coiled in the right hemipelvis. No abdominal wall hernia. Musculoskeletal: No acute or destructive bony abnormalities. Reconstructed images demonstrate no additional findings. Review of the MIP images confirms the above findings. IMPRESSION: Vascular: 1. Stable thoracoabdominal aortic aneurysm and dissection, with no significant change in the endoluminal stent graft seen within the thoracic aorta on prior study. 2. No evidence of aortic leak or rupture. 3. Stable stenoses within the bilateral renal arteries and left external iliac artery. 4.  Aortic Atherosclerosis (ICD10-I70.0). Nonvascular: 1. Persistent pneumoperitoneum and pelvic free fluid, likely due to indwelling peritoneal dialysis catheter which is coiled in the right hemipelvis. 2. Nonobstructing right renal calculus. Electronically Signed   By: Ozell Daring M.D.   On: 09/23/2024 15:13   DG Chest Port 1 View Result Date: 09/23/2024 CLINICAL DATA:  Questionable sepsis.  Evaluate for an abnormality. EXAM: PORTABLE CHEST 1 VIEW COMPARISON:  09/07/2024 FINDINGS: Again noted is a thoracic aortic stent graft. Heart size is stable and within normal limits. Median sternotomy wires. Lungs are clear without focal airspace disease or pulmonary edema. IMPRESSION: No active disease. Electronically Signed   By: Juliene Balder M.D.   On: 09/23/2024 14:29     .Critical Care  Performed by: Charlyn Sora, MD Authorized by: Charlyn Sora, MD   Critical care provider statement:    Critical care time (minutes):  48   Critical care was necessary to treat or prevent imminent or life-threatening deterioration of the following conditions:  Circulatory failure, renal failure, respiratory failure and  dehydration   Critical care was time spent personally by me on the following activities:  Development of treatment plan with patient or surrogate, discussions with consultants, evaluation of patient's response to treatment, examination of patient, ordering and review of laboratory studies, ordering and review of radiographic studies, ordering and performing treatments and interventions, pulse oximetry, re-evaluation of patient's condition, review of old charts and obtaining history from patient or surrogate Central Line  Date/Time: 09/23/2024 3:53 PM  Performed by: Charlyn Sora, MD Authorized by: Charlyn Sora, MD   Consent:    Consent obtained:  Emergent situation  and verbal   Consent given by:  Patient   Risks, benefits, and alternatives were discussed: yes     Risks discussed:  Arterial puncture, incorrect placement, nerve damage, pneumothorax, infection and bleeding   Alternatives discussed:  No treatment Universal protocol:    Procedure explained and questions answered to patient or proxy's satisfaction: yes     Immediately prior to procedure, a time out was called: yes     Patient identity confirmed:  Arm band Pre-procedure details:    Indication(s): central venous access, hemodynamic monitoring and insufficient peripheral access     Hand hygiene: Hand hygiene performed prior to insertion     Sterile barrier technique: All elements of maximal sterile technique followed     Skin preparation:  Chlorhexidine    Skin preparation agent: Skin preparation agent completely dried prior to procedure   Sedation:    Sedation type:  None Anesthesia:    Anesthesia method:  None Procedure details:    Location:  R internal jugular   Patient position:  Supine   Procedural supplies:  Triple lumen   Landmarks identified: yes     Ultrasound guidance: yes     Ultrasound guidance timing: prior to insertion and real time     Sterile ultrasound techniques: Sterile gel and sterile probe covers  were used     Number of attempts:  1   Successful placement: yes   Post-procedure details:    Post-procedure:  Dressing applied   Assessment:  Blood return through all ports, free fluid flow and no pneumothorax on x-ray   Procedure completion:  Tolerated well, no immediate complications    Medications Ordered in the ED  lactated ringers  infusion (has no administration in time range)  sodium chloride  0.9 % bolus 1,000 mL (1,000 mLs Intravenous New Bag/Given 09/23/24 1524)  ceFEPIme  (MAXIPIME ) 2 g in sodium chloride  0.9 % 100 mL IVPB (2 g Intravenous New Bag/Given 09/23/24 1530)  metroNIDAZOLE  (FLAGYL ) IVPB 500 mg (has no administration in time range)  vancomycin  (VANCOREADY) IVPB 1500 mg/300 mL (has no administration in time range)  fentaNYL  (SUBLIMAZE ) injection 50 mcg (has no administration in time range)  sodium chloride  0.9 % bolus 1,500 mL (1,500 mLs Intravenous New Bag/Given 09/23/24 1526)  iohexol  (OMNIPAQUE ) 350 MG/ML injection 100 mL (100 mLs Intravenous Contrast Given 09/23/24 1440)                                    Medical Decision Making Amount and/or Complexity of Data Reviewed Labs: ordered. Radiology: ordered.  Risk Prescription drug management. Decision regarding hospitalization.   58 year old female with history of ESRD on peritoneal dialysis, HIV, dissection s/p repair and previous history of DVT comes in with chief complaint of severe abdominal pain, shortness of breath, back pain.  EMS found patient hypotensive.  Patient has an IO in place.  Patient is complaining of shortness of breath, no oxygen  saturation picked up by the pulse ox, therefore she is on nonrebreather.  Patient protecting her airway.  She does not appear to be in respiratory distress.  Differential diagnosis for this patient includes extension of aortic dissection, acute coronary syndrome, spontaneous bacterial peritonitis, questionable septic shock, hypovolemic shock, hemorrhagic  shock.  Patient had a fall on Saturday.  I did bedside ultrasound, patient has free fluid in the right upper quadrant and left upper quadrant.  However patient also gets PD, which makes the free fluid in the  abdomen ambiguous.  CT scan of the chest abdomen pelvis with contrast has been ordered.  We were unsuccessful in getting IV access, therefore central line was placed.   I have independently interpreted patient's initial chest x-ray, that did not show any evidence of volume overload or focal consolidation or pleural effusion.  Patient's labs indicate elevated troponin, likely demand ischemia. She has respiratory alkalosis, that is likely because of her dialysis state. She has elevated lactate, patient is getting fluid resuscitation.  Patient's BP is started improving over time.  In total 2.5 L of fluid has been ordered.  Sepsis hemodynamic reassessment was completed at 3:30 PM.  Patient's BP now over 90 consistently.  Patient looks a lot better and is more alert.  I have consulted nephrology, to see if they can help acquiring peritoneal fluid for ruling out SBP and putting patient on dialysis list.  I have also consulted critical care to evaluate the patient for disposition.  Patient has received broad-spectrum antibiotics.  Final diagnoses:  Shock circulatory (HCC)  ESRD (end stage renal disease) on dialysis [facility])  Acute abdominal pain    ED Discharge Orders     None          Charlyn Sora, MD 09/23/24 1556

## 2024-09-23 NOTE — Sepsis Progress Note (Signed)
 Made charge nurse aware that pt needed 3rd lactic acid drawn

## 2024-09-24 ENCOUNTER — Inpatient Hospital Stay (HOSPITAL_COMMUNITY)

## 2024-09-24 DIAGNOSIS — E46 Unspecified protein-calorie malnutrition: Secondary | ICD-10-CM

## 2024-09-24 DIAGNOSIS — R109 Unspecified abdominal pain: Secondary | ICD-10-CM

## 2024-09-24 DIAGNOSIS — R571 Hypovolemic shock: Secondary | ICD-10-CM

## 2024-09-24 DIAGNOSIS — K8020 Calculus of gallbladder without cholecystitis without obstruction: Secondary | ICD-10-CM | POA: Diagnosis not present

## 2024-09-24 LAB — LACTIC ACID, PLASMA: Lactic Acid, Venous: 1.2 mmol/L (ref 0.5–1.9)

## 2024-09-24 LAB — ECHOCARDIOGRAM COMPLETE
Area-P 1/2: 3.77 cm2
Calc EF: 27.6 %
MV M vel: 5.38 m/s
MV Peak grad: 115.8 mmHg
S' Lateral: 3.8 cm
Single Plane A2C EF: 28.8 %
Single Plane A4C EF: 26.5 %
Weight: 2825.42 [oz_av]

## 2024-09-24 LAB — GLUCOSE, CAPILLARY
Glucose-Capillary: 152 mg/dL — ABNORMAL HIGH (ref 70–99)
Glucose-Capillary: 110 mg/dL — ABNORMAL HIGH (ref 70–99)
Glucose-Capillary: 172 mg/dL — ABNORMAL HIGH (ref 70–99)
Glucose-Capillary: 151 mg/dL — ABNORMAL HIGH (ref 70–99)
Glucose-Capillary: 173 mg/dL — ABNORMAL HIGH (ref 70–99)
Glucose-Capillary: 167 mg/dL — ABNORMAL HIGH (ref 70–99)

## 2024-09-24 LAB — BRAIN NATRIURETIC PEPTIDE: B Natriuretic Peptide: 1614.8 pg/mL — ABNORMAL HIGH (ref 0.0–100.0)

## 2024-09-24 LAB — CBC
HCT: 28 % — ABNORMAL LOW (ref 36.0–46.0)
Hemoglobin: 9.2 g/dL — ABNORMAL LOW (ref 12.0–15.0)
MCH: 36.5 pg — ABNORMAL HIGH (ref 26.0–34.0)
MCHC: 32.9 g/dL (ref 30.0–36.0)
MCV: 111.1 fL — ABNORMAL HIGH (ref 80.0–100.0)
Platelets: 186 K/uL (ref 150–400)
RBC: 2.52 MIL/uL — ABNORMAL LOW (ref 3.87–5.11)
RDW: 15.1 % (ref 11.5–15.5)
WBC: 10.8 K/uL — ABNORMAL HIGH (ref 4.0–10.5)
nRBC: 0.7 % — ABNORMAL HIGH (ref 0.0–0.2)

## 2024-09-24 LAB — MAGNESIUM
Magnesium: 1.4 mg/dL — ABNORMAL LOW (ref 1.7–2.4)
Magnesium: 2.7 mg/dL — ABNORMAL HIGH (ref 1.7–2.4)

## 2024-09-24 LAB — BASIC METABOLIC PANEL WITH GFR
Anion gap: 20 — ABNORMAL HIGH (ref 5–15)
BUN: 35 mg/dL — ABNORMAL HIGH (ref 6–20)
CO2: 17 mmol/L — ABNORMAL LOW (ref 22–32)
Calcium: 8.3 mg/dL — ABNORMAL LOW (ref 8.9–10.3)
Chloride: 98 mmol/L (ref 98–111)
Creatinine, Ser: 11.85 mg/dL — ABNORMAL HIGH (ref 0.44–1.00)
GFR, Estimated: 3 mL/min — ABNORMAL LOW (ref >60)
Glucose, Bld: 136 mg/dL — ABNORMAL HIGH (ref 70–99)
Potassium: 2.9 mmol/L — ABNORMAL LOW (ref 3.5–5.1)
Sodium: 135 mmol/L (ref 135–145)

## 2024-09-24 LAB — VITAMIN B12: Vitamin B-12: 1163 pg/mL — ABNORMAL HIGH (ref 180–914)

## 2024-09-24 LAB — FOLATE: Folate: 14.6 ng/mL (ref >5.9)

## 2024-09-24 LAB — BODY FLUID CELL COUNT WITH DIFFERENTIAL: Total Nucleated Cell Count, Fluid: 3 uL (ref 0–1000)

## 2024-09-24 LAB — CORTISOL: Cortisol, Plasma: 7.1 ug/dL

## 2024-09-24 LAB — POTASSIUM: Potassium: 3.7 mmol/L (ref 3.5–5.1)

## 2024-09-24 LAB — PHOSPHORUS: Phosphorus: 3.9 mg/dL (ref 2.5–4.6)

## 2024-09-24 LAB — TROPONIN I (HIGH SENSITIVITY): Troponin I (High Sensitivity): 341 ng/L (ref <18)

## 2024-09-24 MED ORDER — OXYCODONE HCL 5 MG PO TABS
5.0000 mg | ORAL_TABLET | Freq: Four times a day (QID) | ORAL | Status: DC
  Administered 2024-09-24 (×2): 5 mg via ORAL
  Filled 2024-09-24 (×4): qty 1

## 2024-09-24 MED ORDER — HEPARIN SODIUM (PORCINE) 5000 UNIT/ML IJ SOLN
5000.0000 [IU] | Freq: Three times a day (TID) | INTRAMUSCULAR | Status: DC
  Administered 2024-09-24 – 2024-09-27 (×9): 5000 [IU] via SUBCUTANEOUS
  Filled 2024-09-24 (×9): qty 1

## 2024-09-24 MED ORDER — POTASSIUM CHLORIDE 10 MEQ/100ML IV SOLN
10.0000 meq | INTRAVENOUS | Status: AC
  Administered 2024-09-24 (×4): 10 meq via INTRAVENOUS
  Filled 2024-09-24 (×4): qty 100

## 2024-09-24 MED ORDER — FLUDROCORTISONE ACETATE 0.1 MG PO TABS
0.1000 mg | ORAL_TABLET | Freq: Every day | ORAL | Status: DC
  Administered 2024-09-24 – 2024-09-27 (×4): 0.1 mg via ORAL
  Filled 2024-09-24 (×4): qty 1

## 2024-09-24 MED ORDER — RENA-VITE PO TABS
1.0000 | ORAL_TABLET | Freq: Every day | ORAL | Status: DC
  Administered 2024-09-24 – 2024-09-26 (×3): 1 via ORAL
  Filled 2024-09-24 (×3): qty 1

## 2024-09-24 MED ORDER — MIRTAZAPINE 7.5 MG PO TABS
7.5000 mg | ORAL_TABLET | Freq: Every day | ORAL | Status: AC
  Administered 2024-09-24 – 2024-09-26 (×3): 7.5 mg via ORAL
  Filled 2024-09-24 (×4): qty 1

## 2024-09-24 MED ORDER — MAGNESIUM SULFATE 2 GM/50ML IV SOLN
2.0000 g | Freq: Once | INTRAVENOUS | Status: AC
  Administered 2024-09-24: 2 g via INTRAVENOUS
  Filled 2024-09-24: qty 50

## 2024-09-24 MED ORDER — HYDROCORTISONE SOD SUC (PF) 100 MG IJ SOLR
100.0000 mg | Freq: Two times a day (BID) | INTRAMUSCULAR | Status: DC
  Administered 2024-09-24 – 2024-09-25 (×2): 100 mg via INTRAVENOUS
  Filled 2024-09-24 (×2): qty 2

## 2024-09-24 MED ORDER — CETAPHIL MOISTURIZING EX LOTN
TOPICAL_LOTION | CUTANEOUS | Status: AC | PRN
  Filled 2024-09-24: qty 473

## 2024-09-24 MED ORDER — PERFLUTREN LIPID MICROSPHERE
1.0000 mL | INTRAVENOUS | Status: AC | PRN
  Administered 2024-09-24: 4 mL via INTRAVENOUS

## 2024-09-24 MED ORDER — MIDODRINE HCL 5 MG PO TABS
5.0000 mg | ORAL_TABLET | Freq: Three times a day (TID) | ORAL | Status: DC
  Administered 2024-09-24 – 2024-09-27 (×7): 5 mg via ORAL
  Filled 2024-09-24 (×8): qty 1

## 2024-09-24 MED ORDER — HYDROCORTISONE SOD SUC (PF) 100 MG IJ SOLR
50.0000 mg | Freq: Two times a day (BID) | INTRAMUSCULAR | Status: DC
  Administered 2024-09-24: 50 mg via INTRAVENOUS
  Filled 2024-09-24: qty 2

## 2024-09-24 MED ORDER — GABAPENTIN 100 MG PO CAPS
100.0000 mg | ORAL_CAPSULE | Freq: Three times a day (TID) | ORAL | Status: DC
  Administered 2024-09-24 – 2024-09-25 (×4): 100 mg via ORAL
  Filled 2024-09-24 (×4): qty 1

## 2024-09-24 MED ORDER — DEXTROSE 10 % IV SOLN: INTRAVENOUS | Status: DC

## 2024-09-24 MED ORDER — DELFLEX-LC/1.5% DEXTROSE 344 MOSM/L IP SOLN: INTRAPERITONEAL | Status: DC

## 2024-09-24 NOTE — Progress Notes (Addendum)
 PD post treatment note  PD treatment completed. Patient tolerated treatment well. PD effluent is clear. No specimen collected.  PD exit site clean, dry and intact. Patient is awake, oriented and in no acute distress.  Report given to bedside nurse.   Post treatment VS: See FlowSheet  Total UF removed:  -359  Post treatment weight: See FlowSheet  09/24/24 1023  Peritoneal Catheter Mid lower abdomen  Placement Date/Time: 01/10/24 1213   Serial / Lot #: [id number]  Expiration Date: 04/15/28  Procedural Verification: Medical records & consent reviewed;Relevant studies,results and images reviewed;Site marked with initials  Time out: Correct Patient;Corre...  Site Assessment Clean, Dry, Intact  Catheter status Deaccessed  Dressing Status Clean, Dry, Intact  Dressing Intervention Assessed, no intervention needed  Completion  Treatment Status Complete  Initial Drain Volume 39  Average Dwell Time-Hour(s) 1  Average Dwell Time-Min(s) 30  Average Drain Time 26  Total Therapy Volume [id number]  Total Therapy Time-Hour(s) 10  Total Therapy Time-Min(s) 49  Effluent Appearance Clear;Amber  Cell Count on Daytime Exchange N/A  Fluid Balance - CCPD  Total UF (- value on cycler, pt gain) -359 mL  Procedure Comments  Tolerated treatment well? Yes  Peritoneal Dialysis Comments No complaint of pain in abdomen, fluid straw color and clear  Education / Care Plan  Dialysis Education Provided Yes  Hand-off documentation  Hand-off Given Given to shift RN/LPN  Report given to (Full Name) Aloysius HAMS RN  Hand-off Received Received from shift RN/LPN  Report received from (Full Name) Joane Bors RN

## 2024-09-24 NOTE — Progress Notes (Addendum)
 NAME:  [name], MRN:  [id number], DOB:  02-27-1966, LOS: 1 ADMISSION DATE:  09/23/2024, CONSULTATION DATE:  09/23/2024 REFERRING MD:  Dr. Charlyn CHIEF COMPLAINT:  abdominal and back pain, shock    History of Present Illness:  [name] is a 58 year old female with history of ESRD on PD, HIV, aortic dissection s/p endovascular repair (08/25/2023), recent history of pituitary apoplexy, HTN, papillary thyroid  carcinoma s/p thyroidectomy, sarcoidosis, and T2DM who presented to the ED from dialysis for for back pain, abdominal pain and dyspnea. Initial blood pressure in the ED was 64/30. An I/O was placed and she received IVF resuscitation with improvement in her blood pressure. CTA CAP showed stable thoracoabdominal aortic aneurysm and dissection with no significant change in the endoluminal stent graft, no evidence of aortic leak or rupture and persistent pneumoperitoneum and pelvic free fluid in the setting of peritoneal dialysis catheter.    Interestingly she was recently admitted for similar presentation of nausea, vomiting, diarrhea and abdominal pain from 09/07/2024-09/12/2024 at which time a work up was negative for PD catheter associated peritonitis. She was treated empirically for C. Diff given recent antibiotics and C. Diff positive antigen. A subsequent C. Diff 09/08/2024 was negative.    Labs remarkable for ABG 7.64/17.9/193, K 2.8, BUN 39, Cr 14.09, alk phos 244, albumin  1.9, AST 19, ALT 9, troponin 338, lactate 5.0, Hgb 11.8, and platelets 214.   Pertinent  Medical History  ESRD on PD, anemia, anxiety, asthma, T2DM, aortic dissection s/p endovascular repair, GERD, Grave's disease, HTN, HLD, papillary thyroid  cancer s/p thyroidectomy (2018) PVD, falls, sarcoidosis, suppurative hydradenitis   Significant Hospital Events: Including procedures, antibiotic start and stop dates in addition to other pertinent events   12/8: admit to ICU for shock   Interim History / Subjective:   NAEON; states pain is a bit better moderate this morning. States she doesn't have an appetite   Objective   Blood pressure (!) 109/55, pulse 72, temperature 98.2 F (36.8 C), temperature source Oral, resp. rate 15, weight 80.1 kg, last menstrual period 03/31/2014, SpO2 94%.    FiO2 (%):  [21 %] 21 %   Intake/Output Summary (Last 24 hours) at 09/24/2024 0824 Last data filed at 09/24/2024 0800 Gross per 24 hour  Intake 3506.81 ml  Output --  Net 3506.81 ml   Filed Weights   09/23/24 1709 09/23/24 1730 09/24/24 0500  Weight: 76.6 kg 76.6 kg 80.1 kg    Examination: General: middle aged female, sitting in bed eating breakfast, no acute distress  HENT: ncat, anicteric sclera, mmm  Lungs: clear bilaterally, room air, resp even and unlabored  Cardiovascular: s1s2, no murmur, no LE edema  Abdomen: rounded, soft, PD cath, no TTP  Extremities: warm, no edema  Neuro: awake, alert, oriented, non focal  GU: no foley  Skin: dry   Resolved Hospital Problem list    Assessment & Plan:  Shock, hypovolemic +/- septic  Lactic acidosis, resolved  CTA without clear source of infection, did note persistent pneumoperitoneum and pelvic free fluid likely related to PD catheter although abdominal exam not consistent with peritonitis.  Volume depletion with poor PO intake and N/V Received IVF resusc with 25g 25% albumin  x4 doses  - con't zosyn , DC vanc today  - Follow-up blood cultures - NGTD  - echo and LE duplex pending - peritoneal fluid cultures pending  - dc IVF, can continue D10 for now until appetite picks up  - NE as needed to maintain MAP>65 or  SBP>90 - add midodrine  5mg  TID, hydrocortisone  50mg  BID - morning cortisol low normal   Bradycardia Elevated troponin Elevated troponin likely due to demand ischemia. ECG sinus rhythm, LBBB (similar to prior). Troponins 300s  - add on BNP today  - echo to be done today   Acute low back pain Acute abdominal pain  Reportedly low back pain  present since dissection repair. CT dissection negative for any changes. MRI T and L spine negative. On exam she tells me abdominal pain began on the right upper side.  - obtain RUQ US  to be complete  - IV APAP prn  - IV dilaudid  prn  - lidocaine  patches  - robaxin  500mg  q8h  - add po oxy 5mg  q6h to come off IV support   History of Type B aortic dissection s/p TEVAR and aortic debranching (08/25/2023) Neurovascular exam intact. CTA with stable dissection and endoluminal stent graft, no leak or rupture. Vascular saw patient with nothing to add    ESRD Hypokalemia Hypocalcemia Hypomagnesemia  Query if chronic nausea/vomiting is related to PD failure.  - Nephro consulted, appreciate recommendations  - PD today ~ 10am  - K, mag, calcium  repletion  - Continue home calcitriol   Chronic nausea/vomiting  Malnutrition Decreased appetite Hypoalbuminemia - repleted - Consult RD and SLP - start low dose remeron  7.5mg  nightly after discussing with her  - steroids may also help with appetite temporarily  - May need Cortrak and tube feeds if not getting adequate PO intake  - PRN Zofran , may need to schedule   History of HIV CD4 445 on 10/15.  - Continue home HAART    Anemia  Macrocytic ; may be from chronic illness, HIV, meds  - will send of B12 and folate, no history of significant etoh use, thyroid  studies normal - trend   History of pituitary apoplexy History of papillary thyroid  cancer s/p thyroidectomy TSH normal, pituitary labs pending, AM cortisol borderline  - start hydrocortisone  50mg  q12 - Check pituitary labs - pending - Continue home Synthroid     History of T2DM - SSI   Deconditioning - Will need PT/OT when clinically stable    GI prophylaxis: continue formulary alternative home PPI VTE prophylaxis: SCDs only for now  Labs   CBC: Recent Labs  Lab 09/23/24 1352 09/23/24 1401 09/23/24 1431 09/24/24 0354  WBC  --   --  7.8 10.8*  NEUTROABS  --   --  5.1  --    HGB 14.6 13.6 11.8* 9.2*  HCT 43.0 40.0 36.9 28.0*  MCV  --   --  111.1* 111.1*  PLT  --   --  214 186    Basic Metabolic Panel: Recent Labs  Lab 09/23/24 1352 09/23/24 1401 09/23/24 1431 09/24/24 0354  NA 137 134* 141 135  K 4.2 3.8 2.8* 2.9*  CL 101  --  98 98  CO2  --   --  25 17*  GLUCOSE 134*  --  101* 136*  BUN 57*  --  39* 35*  CREATININE 14.30*  --  14.09* 11.85*  CALCIUM   --   --  8.1* 8.3*  MG  --   --   --  1.4*  PHOS  --   --   --  3.9   GFR: Estimated Creatinine Clearance: 5.5 mL/min (A) (by C-G formula based on SCr of 11.85 mg/dL (H)). Recent Labs  Lab 09/23/24 1352 09/23/24 1357 09/23/24 1431 09/23/24 1838 09/24/24 0000 09/24/24 0354  WBC  --   --  7.8  --   --  10.8*  LATICACIDVEN 5.0* 5.1*  --  2.2* 1.2  --     Liver Function Tests: Recent Labs  Lab 09/23/24 1431  AST 19  ALT 9  ALKPHOS 244*  BILITOT 1.0  PROT 6.6  ALBUMIN  1.9*   Recent Labs  Lab 09/23/24 1839  LIPASE 30  AMYLASE 48   No results for input(s): AMMONIA in the last 168 hours.  ABG    Component Value Date/Time   PHART 7.376 08/26/2023 1412   PCO2ART 35.7 08/26/2023 1412   PO2ART 65 (L) 08/26/2023 1412   HCO3 19.6 (L) 09/23/2024 1401   TCO2 20 (L) 09/23/2024 1401   ACIDBASEDEF 4.0 (H) 08/26/2023 1412   O2SAT 100 09/23/2024 1401     Coagulation Profile: Recent Labs  Lab 09/23/24 1431  INR 1.1    Cardiac Enzymes: No results for input(s): CKTOTAL, CKMB, CKMBINDEX, TROPONINI in the last 168 hours.  HbA1C: Hemoglobin A1C  Date/Time Value Ref Range Status  09/20/2024 01:35 PM 5.6 4.0 - 5.6 % Final  01/11/2024 01:20 PM 5.8 (A) 4.0 - 5.6 % Final   Hgb A1c MFr Bld  Date/Time Value Ref Range Status  07/31/2024 07:15 PM 6.1 (H) 4.8 - 5.6 % Final    Comment:    (NOTE) Diagnosis of Diabetes The following HbA1c ranges recommended by the American Diabetes Association (ADA) may be used as an aid in the diagnosis of diabetes mellitus.  Hemoglobin              Suggested A1C NGSP%              Diagnosis  <5.7                   Non Diabetic  5.7-6.4                Pre-Diabetic  >6.4                   Diabetic  <7.0                   Glycemic control for                       adults with diabetes.    08/23/2023 11:13 AM 5.2 4.8 - 5.6 % Final    Comment:    (NOTE) Pre diabetes:          5.7%-6.4%  Diabetes:              >6.4%  Glycemic control for   <7.0% adults with diabetes     CBG: Recent Labs  Lab 09/23/24 1947 09/23/24 2325 09/23/24 2357 09/24/24 0342 09/24/24 0751  GLUCAP 114* 68* 98 152* 110*    Review of Systems:   As above   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), COVID-19 virus infection (04/28/2022), Diabetes mellitus without complication (HCC), Dissecting abdominal aortic aneurysm (HCC) (02/05/2023), DVT (deep venous thrombosis) (HCC) (08/29/2023), DVT of upper extremity (deep vein thrombosis) (HCC) (09/03/2023), Dysphagia (07/23/2019), Encephalopathy acute (02/02/2023), Family history of breast cancer, Family history of lung cancer, Family history  of prostate cancer, Gastroenteritis (07/10/2007), Genetic testing (07/25/2018), GERD (07/24/2006), GRAVE'S DISEASE (01/01/2008), History of hidradenitis suppurativa, History of kidney stones, History of thrush, HIV DISEASE (07/24/2006), Hyperlipidemia, HYPERTENSION (07/24/2006), Hyperthyroidism (08/2006), Ileus (HCC) (02/14/2023), Left bundle branch block (LBBB), Malnutrition of moderate degree  (02/08/2023), Menometrorrhagia, Nephrolithiasis, Panniculitis (05/12/2020), Papillary adenocarcinoma of thyroid  (HCC), Peripheral vascular disease, Personal history of chemotherapy, Personal history of radiation therapy, Pneumonia (2005), Port-A-Cath in place (07/12/2018), 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   Surgical History:   Past Surgical History:  Procedure Laterality Date   AORTIC ARCH DEBRANCHING N/A 08/25/2023   Procedure: AORTIC ARCH DEBRANCHING USING 12X8X8MM HEMASHIELD GRAFT;  Surgeon: Lucas Dorise POUR, MD;  Location: [facility];  Service: Open Heart Surgery;  Laterality: N/A;  with bypasses to the innominate and left common carotid arteries   APPLICATION OF WOUND VAC N/A 01/20/2021   Procedure: APPLICATION OF WOUND VAC;  Surgeon: Lowery Estefana RAMAN, DO;  Location: [facility];  Service: Plastics;  Laterality: N/A;   BREAST EXCISIONAL BIOPSY Right 04/26/2018   right axilla negative   BREAST EXCISIONAL BIOPSY Left 04/26/2018   left axilla negative   BREAST LUMPECTOMY Right 10/03/2018   malignant   BREAST LUMPECTOMY WITH RADIOACTIVE SEED AND SENTINEL LYMPH NODE BIOPSY Right 10/03/2018   Procedure: RIGHT BREAST LUMPECTOMY WITH RADIOACTIVE SEED AND SENTINEL LYMPH NODE MAPPING;  Surgeon: Vanderbilt Ned, MD;  Location: [facility];  Service: General;  Laterality: Right;   BREAST SURGERY  1997   Breast Reduction    CAPD INSERTION N/A 12/21/2023   Procedure: LAPAROSCOPIC INSERTION CONTINUOUS AMBULATORY PERITONEAL DIALYSIS  (CAPD) CATHETER; LAPAROSCOPIC OMENTUMPEXY;  Surgeon: Magda Ned SAILOR, MD;  Location: [facility];  Service: Vascular;  Laterality: N/A;   CYSTOSCOPY W/ URETERAL STENT REMOVAL  11/09/2012   Procedure: CYSTOSCOPY WITH STENT  REMOVAL;  Surgeon: Ricardo Likens, MD;  Location: [facility];  Service: Urology;  Laterality: Right;   CYSTOSCOPY WITH RETROGRADE PYELOGRAM, URETEROSCOPY AND STENT PLACEMENT  11/09/2012   Procedure: CYSTOSCOPY WITH RETROGRADE PYELOGRAM, URETEROSCOPY AND STENT PLACEMENT;  Surgeon: Ricardo Likens, MD;  Location: [facility];  Service: Urology;  Laterality: Left;  LEFT URETEROSCOPY, STONE MANIPULATION, left STENT exchange    CYSTOSCOPY WITH STENT PLACEMENT  10/02/2012   Procedure: CYSTOSCOPY WITH STENT PLACEMENT;  Surgeon: Ricardo Likens, MD;  Location: [facility];  Service: Urology;  Laterality: Left;   DEBRIDEMENT AND CLOSURE WOUND N/A 01/20/2021   Procedure: Excision of abdominal wound with closure;  Surgeon: Lowery Estefana RAMAN, DO;  Location: [facility];  Service: Plastics;  Laterality: N/A;   DILATION AND CURETTAGE OF UTERUS  11/2002   s/p for 1st trimester nonviable pregnancy   ESOPHAGOGASTRODUODENOSCOPY N/A 09/11/2024   Procedure: EGD (ESOPHAGOGASTRODUODENOSCOPY);  Surgeon: Abran Norleen SAILOR, MD;  Location: [facility];  Service: Gastroenterology;  Laterality: N/A;   EYE SURGERY     sty under eyelid   INCISE AND DRAIN ABCESS  08/2002   s/p I &D for righ inframmary fold hidradenitis   INCISION AND DRAINAGE PERITONSILLAR ABSCESS  12/2001   IR CV LINE INJECTION  06/07/2018   IR FLUORO GUIDE CV LINE RIGHT  01/30/2023   IR IMAGING GUIDED PORT INSERTION  06/20/2018   IR REMOVAL TUN ACCESS W/ PORT W/O FL MOD SED  06/20/2018   IR US  GUIDE VASC ACCESS RIGHT  01/30/2023   IRRIGATION AND DEBRIDEMENT ABSCESS  01/31/2012   Procedure: IRRIGATION AND DEBRIDEMENT ABSCESS;  Surgeon: Alm VEAR Angle, MD;  Location: [facility];  Service: General;  Laterality: Right;  right breast and axilla    LAPAROSCOPIC REPOSITIONING CAPD CATHETER Left 01/10/2024   Procedure: Removal of peritoneal dialysis Catheter.;  Surgeon: Magda Debby SAILOR, MD;  Location: [facility] OR;  Service: Vascular;  Laterality: Left;   LAPAROSCOPY N/A  01/10/2024   Procedure: LAPAROSCOPY, DIAGNOSTIC;  Surgeon: Magda Debby SAILOR, MD;  Location: [facility] OR;  Service: Vascular;  Laterality: N/A;   NEPHROLITHOTOMY  10/02/2012   Procedure: NEPHROLITHOTOMY PERCUTANEOUS;  Surgeon: Ricardo Likens, MD;  Location: [facility];  Service: Urology;  Laterality: Right;  First Stage Percutaneous Nephrolithotomy with Surgeon Access, Left Ureteral Stent     NEPHROLITHOTOMY  10/04/2012   Procedure: NEPHROLITHOTOMY PERCUTANEOUS SECOND LOOK;  Surgeon: Ricardo Likens, MD;  Location: [facility];  Service: Urology;  Laterality: Right;      NEPHROLITHOTOMY  10/08/2012   Procedure: NEPHROLITHOTOMY PERCUTANEOUS;  Surgeon: Ricardo Likens, MD;  Location: [facility];  Service: Urology;  Laterality: Right;  THIRD STAGE, nephrostomy tube exchange x 2   NEPHROLITHOTOMY  10/11/2012   Procedure: NEPHROLITHOTOMY PERCUTANEOUS SECOND LOOK;  Surgeon: Ricardo Likens, MD;  Location: [facility];  Service: Urology;  Laterality: Right;  RIGHT 4 STAGE PERCUTANOUS NEPHROLITHOTOMY, right URETEROSCOPY WITH HOLMIUM LASER    PANNICULECTOMY N/A 12/21/2020   Procedure: PANNICULECTOMY;  Surgeon: Lowery Estefana RAMAN, DO;  Location: [facility];  Service: Plastics;  Laterality: N/A;   PERCUTANEOUS NEPHROSTOLITHOTOMY  04/2022   PORT-A-CATH REMOVAL N/A 07/16/2020   Procedure: REMOVAL PORT-A-CATH;  Surgeon: Vanderbilt Debby, MD;  Location:  SURGERY CENTER;  Service: General;  Laterality: N/A;   PORTACATH PLACEMENT Left 05/17/2018   Procedure: INSERTION PORT-A-CATH;  Surgeon: Vernetta Berg, MD;  Location:  SURGERY CENTER;  Service: General;  Laterality: Left;   RADICAL NECK DISSECTION  04/12/2017   limited/notes 04/12/2017   RADICAL NECK DISSECTION N/A 04/12/2017   Procedure: RADICAL NECK DISSECTION;  Surgeon: Carlie Clark, MD;  Location[facility] OR;  Service: ENT;  Laterality: N/A;  limited neck dissection 2 hours total   REDUCTION MAMMAPLASTY Bilateral 1998   RIGHT/LEFT HEART CATH AND CORONARY ANGIOGRAPHY  N/A 03/12/2020   Procedure: RIGHT/LEFT HEART CATH AND CORONARY ANGIOGRAPHY;  Surgeon: Cherrie Toribio SAUNDERS, MD;  [facility]: [facility];  Service: Cardiovascular;  Laterality: N/A;   [place]  1994   TEE WITHOUT CARDIOVERSION N/A 08/25/2023   Procedure: TRANSESOPHAGEAL ECHOCARDIOGRAM;  Surgeon: Lucas Dorise POUR, MD;  Location: [facility];  Service: Open Heart Surgery;  Laterality: N/A;   TENCKHOFF CATHETER INSERTION Left 01/10/2024   Procedure: INSERTION, CATHETER, DIALYSIS, PERITONEAL;  Surgeon: Magda Debby SAILOR, MD;  Location: [facility];  Service: Vascular;  Laterality: Left;   THORACIC AORTIC ENDOVASCULAR STENT GRAFT N/A 02/02/2023   Procedure: THORACIC AORTIC ENDOVASCULAR STENT GRAFT;  Surgeon: Lanis Fonda BRAVO, MD;  Location: [facility] OR;  Service: Vascular;  Laterality: N/A;   THORACIC AORTIC ENDOVASCULAR STENT GRAFT N/A 08/25/2023   Procedure: THORACIC AORTIC ENDOVASCULAR STENT GRAFT;  Surgeon: Lanis Fonda BRAVO, MD;  Location: [facility] OR;  Service: Vascular;  Laterality: N/A;   THYROIDECTOMY  04/12/2017   completion/notes 04/12/2017   THYROIDECTOMY N/A 04/12/2017   Procedure: THYROIDECTOMY;  Surgeon: Carlie Clark, MD;  Location: [facility] OR;  Service: ENT;  Laterality: N/A;  Completion Thyroidectomy   TOTAL THYROIDECTOMY  2010   ULTRASOUND GUIDANCE FOR VASCULAR ACCESS Right 08/25/2023   Procedure: ULTRASOUND GUIDANCE FOR VASCULAR ACCESS, RIGHT FEMORAL ARTERY;  Surgeon: Lanis Fonda BRAVO, MD;  Location: [facility] OR;  Service: Vascular;  Laterality: Right;     Social History:   reports that she quit smoking about 6 years ago. Her smoking use included cigarettes. She started smoking about 7 years ago. She has a 7.5 pack-year smoking history. She has never used smokeless tobacco. She reports that she does not currently use alcohol . She reports that she does not use drugs.   Family History:  Her family history includes Breast cancer in her cousin, cousin, cousin, maternal aunt, paternal aunt, and paternal aunt; Breast cancer  (age of onset: 50) in her cousin; Breast cancer (age of onset: 23) in her paternal aunt; Breast cancer (age of onset: 104) in her maternal aunt; Cancer in her maternal uncle and mother; Diabetes in an other family member; Heart disease in her mother and another family member; Hypertension in her brother, father, mother, sister, sister, and another family member; Kidney disease in an other family member; Lung cancer in her paternal uncle; Lung cancer (age of onset: 63) in her father; Pancreatic cancer in her father; Prostate cancer in her paternal uncle and paternal uncle; Stroke in an other family member. There is no history of Colon cancer, Esophageal cancer, Rectal cancer, or Stomach cancer.   Allergies Allergies  Allergen Reactions   Genvoya [Elviteg-Cobic-Emtricit-Tenofaf] Hives   Lisinopril Cough   Aldactone  [Spironolactone ] Hives   Valium  [Diazepam ] Other (See Comments)    Lethargy (pt states she cannot take this again)   Tegaderm Ag Mesh [Silver] Itching     Home Medications  Prior to Admission medications   Medication Sig Start Date End Date Taking? Authorizing Provider  acetaminophen  (TYLENOL ) 500 MG tablet Take 1,000 mg by mouth every 6 (six) hours as needed for mild pain.   Yes [provider]  allopurinol  (ZYLOPRIM ) 100 MG tablet TAKE 1 TABLET (100 MG TOTAL) BY MOUTH DAILY. FOR GOUT PREVENTION 04/11/24  Yes Clark, Katherine K, NP  aspirin  81 MG chewable tablet Chew 81 mg by mouth daily.   Yes [provider]  AURYXIA  1 GM 210 MG(Fe) tablet Take 210 mg by mouth 3 (three) times daily with meals. 04/05/23  Yes [provider]  busPIRone  (BUSPAR ) 5 MG tablet TAKE 1 TABLET BY MOUTH TWICE A DAY FOR ANXIETY 08/26/24  Yes Gretta Comer POUR, NP  dolutegravir  ([name] ) 50 MG tablet Take 1 tablet (50 mg total) by mouth daily. 05/13/24  Yes Melvenia Corean SAILOR, NP  emtricitabine -tenofovir  AF (DESCOVY ) 200-25 MG tablet Take 1 tablet by mouth daily. 05/13/24  Yes Melvenia Corean SAILOR, NP  fluticasone  (FLOVENT  HFA) 110 MCG/ACT inhaler Inhale 1 puff into the lungs 2 (two) times daily.   Yes [provider]  levocetirizine (XYZAL ) 5 MG tablet TAKE 1 TABLET BY MOUTH EVERY DAY IN THE EVENING FOR ALLERGIES 05/12/24  Yes Clark, Katherine K, NP  levothyroxine  (SYNTHROID ) 150 MCG tablet TAKE 1 TABLET BY MOUTH EVERY DAY BEFORE BREAKFAST 09/16/24  Yes Trixie File, MD  lidocaine  (LIDODERM ) 5 % PLACE 1 PATCH ONTO THE SKIN DAILY. REMOVE & DISCARD PATCH WITHIN 12 HOURS OR AS DIRECTED BY MD Patient taking differently: Place 1 patch onto the skin daily as needed. Remove & Discard patch within 12 hours or as directed by MD 04/21/24  Yes Gretta Comer POUR, NP  minocycline  (MINOCIN ) 100 MG capsule Take 1 capsule (100 mg total) by mouth 2 (two) times daily. 03/13/24  Yes Melvenia Corean SAILOR, NP  omeprazole  (PRILOSEC) 40 MG capsule Take  1 capsule (40 mg total) by mouth 2 (two) times daily. for heartburn. 09/12/24  Yes Ghimire, Donalda HERO, MD  ondansetron  (ZOFRAN -ODT) 4 MG disintegrating tablet Take 1 tablet (4 mg total) by mouth every 8 (eight) hours as needed for nausea or vomiting. 08/28/24  Yes Gretta Comer POUR, NP  rosuvastatin  (CRESTOR ) 10 MG tablet TAKE 1 TABLET BY MOUTH EVERY DAY FOR CHOLESTEROL 03/21/24  Yes Clark, Katherine K, NP  sevelamer  carbonate (RENVELA ) 800 MG tablet Take 800 mg by mouth 3 (three) times daily with meals. 08/07/24  Yes [provider]  tiZANidine  (ZANAFLEX ) 4 MG tablet Take 1-2 tablets (4-8 mg total) by mouth every 8 (eight) hours as needed for muscle spasms. 09/20/24  Yes Gretta Comer POUR, NP  calcitRIOL  (ROCALTROL ) 0.25 MCG capsule Take 0.25 mcg by mouth 3 (three) times a week. Patient not taking: Reported on 09/23/2024 08/07/24   [provider]  Lancets JANETT ULTRASOFT) lancets Use as instructed to test blood sugar daily 02/15/24   Gretta Comer POUR, NP     Critical care time: 37    The patient is critically ill with  multiple organ system failure and requires high complexity decision making for assessment and support, frequent evaluation and titration of therapies, advanced monitoring, review of radiographic studies and interpretation of complex data.    Critical Care Time devoted to patient care services, exclusive of separately billable procedures, described in this note is 42  Tinnie FORBES Adolph DEVONNA [facility] 09/24/24 8:26 AM  Please see [email or web address] for pager details.  From 7A-7P if no response, please call [phone] After hours, please call ELink [phone]

## 2024-09-24 NOTE — Progress Notes (Signed)
   09/23/24 2330  Peritoneal Catheter Mid lower abdomen  Placement Date/Time: 01/10/24 1213   Serial / Lot #: [id number]  Expiration Date: 04/15/28  Procedural Verification: Medical records & consent reviewed;Relevant studies,results and images reviewed;Site marked with initials  Time out: Correct Patient;Corre...  Site Assessment Clean, Dry, Intact  Drainage Description None  Catheter status Accessed  Dressing Gauze/Drain sponge  Dressing Status Clean, Dry, Intact  Dressing Intervention New dressing/dressing changed  Cycler Setup  Total Number of Night Cycles 5  Night Fill Volume 2250  Dianeal  Solution Dextrose  1.5% in 6000 mL Low Cal/Low Mag  Night Dwell Time per Cycle - Hour(s) 1  Night Dwell Time per Cycle - Minute(s) 30  Night Time Therapy - Minute(s) 58  Night Time Therapy - Hour(s) 9  Minimum Initial Drain Volume 0  Maximum Peritoneal Volume 3375  Night/Total Therapy Volume 0  Day Exchange No  Completion  Treatment Status Started

## 2024-09-24 NOTE — Progress Notes (Signed)
 [facility] Progress Note  Subjective:  Seen in room Fluid clear coming out, no c/o's this am TNC = 3  Vitals:   09/24/24 1115 09/24/24 1130 09/24/24 1200 09/24/24 1536  BP:  (!) 115/54 (!) 117/56   Pulse:  70 77   Resp:  14 18   Temp: 97.8 F (36.6 C)   (!) 97.5 F (36.4 C)  TempSrc: Oral   Oral  SpO2:  93% 99%   Weight:        Exam: Gen alert, no distress No jvd or bruits Chest clear bilat to bases RRR no MRG Abd soft ntnd no mass or ascites +bs,  MS deformity over the R hip, painful to touch Ext no LE or UE edema, no other edema Neuro is alert, Ox 3 , nf    PD cath intact   Home bp meds: None     OP PD: CCPD 7x week-[facility] From 09/10/24 --> 5 exchanges, fill vol 2250, 1.5hr dwell time Usually does 1/2 1.5% bags and 1/2 2.5% bags       Assessment/ Plan: Hypotension: significant vol depletion is likely given her exam and hx of severe chronic N/V. Improving w/ IVFs.  Vol depletion: was dehydrated on admission. Better now. Use all 1.5% fluids w/ PD.  Abdominal pain: not her 1st complaint to me, and abdomen is minimally tender. PD fluid is clear and TNC is 3 (wnl = < 50-100). No evidence peritonitis. Chronic N/V: this is a serious issue in a PD patient w/ ESRD. Not sure but she could be uremic at creat of 14. Will get PD records.  ESRD: on CCPD. Cont PD nightly while here.  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       Myer Fret MD  [facility] 09/24/2024, 4:22 PM  Recent Labs  Lab 09/23/24 1431 09/24/24 0354  HGB 11.8* 9.2*  ALBUMIN  1.9*  --   CALCIUM  8.1* 8.3*  PHOS  --  3.9  CREATININE 14.09* 11.85*  K 2.8* 2.9*   No results for input(s): IRON , TIBC, FERRITIN in the last 168 hours. Inpatient medications:  allopurinol   100 mg Oral Daily   budesonide  (PULMICORT ) nebulizer solution  0.25 mg Nebulization BID   calcitRIOL   0.25 mcg Oral Once per day on Monday Wednesday Friday   Chlorhexidine  Gluconate Cloth  6  each Topical Daily   dolutegravir   50 mg Oral Daily   emtricitabine -tenofovir  AF  1 tablet Oral Daily   feeding supplement  237 mL Oral BID BM   fludrocortisone   0.1 mg Oral Daily   gabapentin   100 mg Oral TID   gentamicin  cream  1 Application Topical Daily   heparin  injection (subcutaneous)  5,000 Units Subcutaneous Q8H   hydrocortisone  sod succinate (SOLU-CORTEF ) inj  100 mg Intravenous Q12H   insulin  aspart  0-9 Units Subcutaneous Q4H   levothyroxine   150 mcg Oral Q0600   lidocaine   1 patch Transdermal Q24H   loratadine   10 mg Oral QHS   methocarbamol  (ROBAXIN ) injection  500 mg Intravenous Q8H   midodrine   5 mg Oral TID WC   mirtazapine   7.5 mg Oral QHS   multivitamin  1 tablet Oral QHS   oxyCODONE   5 mg Oral Q6H   pantoprazole   40 mg Oral Daily   rosuvastatin   10 mg Oral Daily    dextrose  30 mL/hr at 09/24/24 1500   dialysis solution 1.5% low-MG/low-CA     lactated ringers  Stopped (09/23/24  1623)   norepinephrine  (LEVOPHED ) Adult infusion 5 mcg/min (09/24/24 1500)   piperacillin -tazobactam (ZOSYN )  IV Stopped (09/24/24 1350)   busPIRone , cetaphil, docusate sodium , heparin  sodium (porcine) 3,000 Units in dialysis solution 1.5% low-MG/low-CA 6,000 mL dialysis solution, HYDROmorphone  (DILAUDID ) injection, LORazepam , ondansetron  (ZOFRAN ) IV, polyethylene glycol

## 2024-09-24 NOTE — Progress Notes (Signed)
 Added hydrocortisone  and fludrocortisone  today  Add gabapentin  for what sounds like neuropathic low back pain (burning and shooting)  DVT US  negative  Echo with EF 30-35%, RWMA, severe MR - new BNP elevation to 1600 - cards consult in AM  RUQ US  still pending  Tinnie FORBES Furth, PA-C [facility] 09/24/24 5:55 PM  Please see [email or web address] for pager details.  From 7A-7P if no response, please call [phone] After hours, please call ELink [phone]

## 2024-09-24 NOTE — Progress Notes (Signed)

## 2024-09-24 NOTE — Evaluation (Signed)
 Clinical/Bedside Swallow Evaluation Patient Details  Name: [name] MRN: [id number] Date of Birth: 1966/03/18  Today's Date: 09/24/2024 Time: SLP Start Time (ACUTE ONLY): 1646 SLP Stop Time (ACUTE ONLY): 1654 SLP Time Calculation (min) (ACUTE ONLY): 8 min  Past Medical History:  Past Medical History:  Diagnosis Date   Acute pain of right shoulder due to trauma 06/08/2023   Acute pancreatitis 10/23/2021   Acute renal failure superimposed on stage 4 chronic kidney disease (HCC) 10/24/2021   Allergy    Anemia    Normocytic   Antibiotic-induced yeast infection 09/28/2020   Anxiety    Asthma    Bronchitis 2005   Bursitis of left shoulder 09/06/2019   CKD (chronic kidney disease)    Dialysis on Mon-Wed- Fri   CLASS 1-EXOPHTHALMOS-THYROTOXIC 02/08/2007   Cognitive dysfunction 02/21/2020   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  Genes Analyzed (43 total): APC*, ATM*, AXIN2, BARD1, BMPR1A, BRCA1*, BRCA2*, BRIP1*, CDH1*, CDK4, CDKN2A, CHEK2*, DICER1, GALNT12, HOXB13, MEN1, MLH1*, MRE11A, MSH2*, MSH3, MSH6*, MUTYH*, NBN, NF1*, NTHL1, PALB2*, PMS2*, POLD1, POLE, PTEN*, RAD50, RAD51C*, RAD51D*, RET, SDHB, SDHD, SMAD4, SMARCA4, STK11 and TP53* (sequencing and deletion/duplication); EGFR (s   GERD 07/24/2006   GRAVE'S DISEASE 01/01/2008   History of hidradenitis suppurativa    History of kidney  stones    History of thrush    HIV DISEASE 07/24/2006   dx March 05   Hyperlipidemia    HYPERTENSION 07/24/2006   Hyperthyroidism 08/2006   Grave's Disease -diffuse radiotracer uptake 08/25/06 Thyroid  scan-Cold nodule to R lower lobe of thyrorid   Ileus (HCC) 02/14/2023   Left bundle branch block (LBBB)    Malnutrition of moderate degree 02/08/2023   Menometrorrhagia    hx of   Nephrolithiasis    Panniculitis 05/12/2020   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 Completed 2 yrs Prednisone  after lung bx confirmation. No symptoms since then.   Sebaceous cyst 04/21/2020   Suppurative hidradenitis    Thyroid  cancer (HCC)    THYROID  NODULE, RIGHT 02/08/2007   Past Surgical History:  Past Surgical History:  Procedure Laterality Date   AORTIC ARCH DEBRANCHING N/A 08/25/2023   Procedure: AORTIC ARCH DEBRANCHING USING 12X8X8MM HEMASHIELD GRAFT;  Surgeon: Lucas Dorise POUR, MD;  Location: [facility];  Service: Open Heart Surgery;  Laterality: N/A;  with bypasses to the innominate and left common carotid arteries   APPLICATION OF WOUND VAC N/A 01/20/2021   Procedure: APPLICATION OF WOUND VAC;  Surgeon: Lowery Estefana RAMAN, DO;  Location: [facility];  Service: Plastics;  Laterality: N/A;   BREAST EXCISIONAL BIOPSY Right 04/26/2018   right axilla negative   BREAST EXCISIONAL BIOPSY Left 04/26/2018   left axilla  negative   BREAST LUMPECTOMY Right 10/03/2018   malignant   BREAST LUMPECTOMY WITH RADIOACTIVE SEED AND SENTINEL LYMPH NODE BIOPSY Right 10/03/2018   Procedure: RIGHT BREAST LUMPECTOMY WITH RADIOACTIVE SEED AND SENTINEL LYMPH NODE MAPPING;  Surgeon: Vanderbilt Ned, MD;  Location: [facility];  Service: General;  Laterality: Right;   BREAST SURGERY  1997   Breast Reduction    CAPD INSERTION N/A 12/21/2023   Procedure: LAPAROSCOPIC INSERTION CONTINUOUS AMBULATORY PERITONEAL DIALYSIS  (CAPD) CATHETER; LAPAROSCOPIC OMENTUMPEXY;  Surgeon: Magda Ned SAILOR, MD;  Location: [facility];  Service: Vascular;  Laterality: N/A;   CYSTOSCOPY W/ URETERAL STENT REMOVAL  11/09/2012   Procedure: CYSTOSCOPY WITH STENT REMOVAL;  Surgeon: Ricardo Likens, MD;  Location: [facility];  Service: Urology;  Laterality: Right;   CYSTOSCOPY WITH RETROGRADE PYELOGRAM, URETEROSCOPY AND STENT PLACEMENT  11/09/2012   Procedure: CYSTOSCOPY WITH RETROGRADE PYELOGRAM, URETEROSCOPY AND STENT PLACEMENT;  Surgeon: Ricardo Likens, MD;  Location: [facility];  Service: Urology;  Laterality: Left;  LEFT URETEROSCOPY, STONE MANIPULATION, left STENT exchange    CYSTOSCOPY WITH STENT PLACEMENT  10/02/2012   Procedure: CYSTOSCOPY WITH STENT PLACEMENT;  Surgeon: Ricardo Likens, MD;  Location: [facility];  Service: Urology;  Laterality: Left;   DEBRIDEMENT AND CLOSURE WOUND N/A 01/20/2021   Procedure: Excision of abdominal wound with closure;  Surgeon: Lowery Estefana RAMAN, DO;  Location: [facility];  Service: Plastics;  Laterality: N/A;   DILATION AND CURETTAGE OF UTERUS  11/2002   s/p for 1st trimester nonviable pregnancy   ESOPHAGOGASTRODUODENOSCOPY N/A 09/11/2024   Procedure: EGD (ESOPHAGOGASTRODUODENOSCOPY);  Surgeon: Abran Norleen SAILOR, MD;  Location: [facility];  Service: Gastroenterology;  Laterality: N/A;   EYE SURGERY     sty under eyelid   INCISE AND DRAIN ABCESS  08/2002   s/p I &D for righ inframmary fold hidradenitis   INCISION AND  DRAINAGE PERITONSILLAR ABSCESS  12/2001   IR CV LINE INJECTION  06/07/2018   IR FLUORO GUIDE CV LINE RIGHT  01/30/2023   IR IMAGING GUIDED PORT INSERTION  06/20/2018   IR REMOVAL TUN ACCESS W/ PORT W/O FL MOD SED  06/20/2018   IR US  GUIDE VASC ACCESS RIGHT  01/30/2023   IRRIGATION AND DEBRIDEMENT ABSCESS  01/31/2012   Procedure: IRRIGATION AND DEBRIDEMENT ABSCESS;  Surgeon: Alm VEAR Angle, MD;  Location: [facility];  Service: General;  Laterality: Right;  right breast and axilla    LAPAROSCOPIC REPOSITIONING CAPD CATHETER Left 01/10/2024   Procedure: Removal of peritoneal dialysis Catheter.;  Surgeon: Magda Ned SAILOR, MD;  Location: [facility] OR;  Service: Vascular;  Laterality: Left;   LAPAROSCOPY N/A 01/10/2024   Procedure: LAPAROSCOPY, DIAGNOSTIC;  Surgeon: Magda Ned SAILOR, MD;  Location: [facility] OR;  Service: Vascular;  Laterality: N/A;   NEPHROLITHOTOMY  10/02/2012   Procedure: NEPHROLITHOTOMY PERCUTANEOUS;  Surgeon: Ricardo Likens, MD;  Location: [facility];  Service: Urology;  Laterality: Right;  First Stage Percutaneous Nephrolithotomy with Surgeon Access, Left Ureteral Stent     NEPHROLITHOTOMY  10/04/2012   Procedure: NEPHROLITHOTOMY PERCUTANEOUS SECOND LOOK;  Surgeon: Ricardo Likens, MD;  [facility]: [facility];  Service: Urology;  Laterality: Right;      NEPHROLITHOTOMY  10/08/2012   Procedure: NEPHROLITHOTOMY PERCUTANEOUS;  Surgeon: Ricardo Likens, MD;  [facility]: [facility];  Service: Urology;  Laterality: Right;  THIRD STAGE, nephrostomy tube exchange x 2   NEPHROLITHOTOMY  10/11/2012   Procedure: NEPHROLITHOTOMY PERCUTANEOUS SECOND LOOK;  Surgeon: Ricardo Likens, MD;  Location: [facility];  Service: Urology;  Laterality: Right;  RIGHT 4 STAGE PERCUTANOUS NEPHROLITHOTOMY, right URETEROSCOPY WITH HOLMIUM LASER    PANNICULECTOMY N/A 12/21/2020   Procedure: PANNICULECTOMY;  Surgeon: Lowery Estefana RAMAN, DO;  [facility]: [facility];  Service: Plastics;  Laterality: N/A;   PERCUTANEOUS NEPHROSTOLITHOTOMY  04/2022    PORT-A-CATH REMOVAL N/A 07/16/2020   Procedure: REMOVAL PORT-A-CATH;  Surgeon: Vanderbilt Ned, MD;  Location: [facility];  Service: General;  Laterality: N/A;   PORTACATH PLACEMENT Left 05/17/2018   Procedure: INSERTION PORT-A-CATH;  Surgeon: Vernetta Berg, MD;  Location: [facility];  Service: General;  Laterality: Left;   RADICAL NECK DISSECTION  04/12/2017   limited/notes 04/12/2017   RADICAL NECK DISSECTION N/A 04/12/2017   Procedure: RADICAL NECK DISSECTION;  Surgeon: Carlie Clark, MD;  Location: [facility] OR;  Service: ENT;  Laterality: N/A;  limited neck dissection 2 hours total   REDUCTION MAMMAPLASTY Bilateral 1998   RIGHT/LEFT HEART CATH AND CORONARY ANGIOGRAPHY N/A 03/12/2020   Procedure: RIGHT/LEFT HEART CATH AND CORONARY ANGIOGRAPHY;  Surgeon: Cherrie Toribio SAUNDERS, MD;  [facility];  Service: Cardiovascular;  Laterality: N/A;   [place]  1994   TEE WITHOUT CARDIOVERSION N/A 08/25/2023   Procedure: TRANSESOPHAGEAL ECHOCARDIOGRAM;  Surgeon: Lucas Dorise POUR, MD;  [facility]: [facility];  Service: Open Heart Surgery;  Laterality: N/A;   TENCKHOFF CATHETER INSERTION Left 01/10/2024   Procedure: INSERTION, CATHETER, DIALYSIS, PERITONEAL;  Surgeon: Magda Ned SAILOR, MD;  Location[facility];  Service: Vascular;  Laterality: Left;   THORACIC AORTIC ENDOVASCULAR STENT GRAFT N/A 02/02/2023   Procedure: THORACIC AORTIC ENDOVASCULAR STENT GRAFT;  Surgeon: Lanis Fonda BRAVO, MD;  Location: [facility] OR;  Service: Vascular;  Laterality: N/A;   THORACIC AORTIC ENDOVASCULAR STENT GRAFT N/A 08/25/2023   Procedure: THORACIC AORTIC ENDOVASCULAR STENT GRAFT;  Surgeon: Lanis Fonda BRAVO, MD;  Location: [facility] OR;  Service: Vascular;  Laterality: N/A;   THYROIDECTOMY  04/12/2017   completion/notes 04/12/2017   THYROIDECTOMY N/A 04/12/2017   Procedure: THYROIDECTOMY;  Surgeon: Carlie Clark, MD;  Location: [facility] OR;  Service: ENT;  Laterality: N/A;  Completion Thyroidectomy   TOTAL  THYROIDECTOMY  2010   ULTRASOUND GUIDANCE FOR VASCULAR ACCESS Right 08/25/2023   Procedure: ULTRASOUND GUIDANCE FOR VASCULAR ACCESS, RIGHT FEMORAL ARTERY;  Surgeon: Lanis Fonda BRAVO, MD;  Location: [facility] OR;  Service: Vascular;  Laterality: Right;   HPI:  58 y.o. female presenting 12/8 from dialysis for back pain, abdominal pain, and dyspnea. BP found to be 64/30; admitted to ICU for shock. 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 PMH: ESRD on PD, anemia, anxiety, asthma, T2DM, aortic dissection s/p endovascular repair, GERD, Grave's disease, HTN, HLD, papillary thyroid  cancer s/p thyroidectomy (2018) PVD, falls, sarcoidosis, suppurative hydradenitis    Assessment / Plan / Recommendation  Clinical Impression  SLP consulted primarily due to limited intake rather than concern for dysphagia. Note, RD saw pt earlier this date and liberalized her diet from renal restrictions to allow more variety. Observed pt feed herself regular solids, achieving complete oral clearance. No signs clinically concerning for aspiration were observed with  thin liquids. Continue current diet without ongoing SLP f/u. SLP Visit Diagnosis: Dysphagia, unspecified (R13.10)    Aspiration Risk  No limitations    Diet Recommendation           Other Recommendations Oral Care Recommendations: Oral care BID     Swallow Evaluation Recommendations Recommendations: PO diet PO Diet Recommendation: Regular;Thin liquids (Level 0) Liquid Administration via: Cup;Straw Medication Administration: Whole meds with liquid Supervision: Patient able to self-feed Swallowing strategies  : Slow rate;Small bites/sips Postural changes: Position pt fully upright for meals Oral care recommendations: Oral care BID (2x/day)   Assistance Recommended at Discharge    Functional Status Assessment Patient has not had a recent decline in their functional status   Frequency and Duration            Prognosis Prognosis for improved oropharyngeal function: Good      Swallow Study   General HPI: 58 y.o. female presenting 12/8 from dialysis for back pain, abdominal pain, and dyspnea. BP found to be 64/30; admitted to ICU for shock. 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 PMH: ESRD on PD, anemia, anxiety, asthma, T2DM, aortic dissection s/p endovascular repair, GERD, Grave's disease, HTN, HLD, papillary thyroid  cancer s/p thyroidectomy (2018) PVD, falls, sarcoidosis, suppurative hydradenitis Type of Study: Bedside Swallow Evaluation Previous Swallow Assessment: none in chart Diet Prior to this Study: Regular;Thin liquids (Level 0) Temperature Spikes Noted: No Respiratory Status: Room air History of Recent Intubation: No Behavior/Cognition: Alert;Cooperative;Pleasant mood Oral Cavity Assessment: Within Functional Limits Oral Care Completed by SLP: No Oral Cavity - Dentition: Adequate natural dentition Vision: Functional for self-feeding Self-Feeding Abilities: Able to feed self Patient Positioning: Upright in bed Baseline Vocal Quality: Normal Volitional Cough: Strong Volitional Swallow: Able to elicit    Oral/Motor/Sensory Function Overall Oral Motor/Sensory Function: Within functional limits   Ice Chips Ice chips: Not tested   Thin Liquid Thin Liquid: Within functional limits Presentation: Straw;Self Fed    Nectar Thick Nectar Thick Liquid: Not tested   Honey Thick Honey Thick Liquid: Not tested   Puree Puree: Not tested   Solid     Solid: Within functional limits Presentation: Self Fed      Damien Blumenthal, M.A., CCC-SLP Speech Language Pathology, Acute Rehabilitation Services  Secure Chat preferred [phone]  09/24/2024,5:10 PM

## 2024-09-24 NOTE — Plan of Care (Signed)
  Problem: Education: Goal: Ability to describe self-care measures that may prevent or decrease complications (Diabetes Survival Skills Education) will improve Outcome: Progressing   Problem: Coping: Goal: Ability to adjust to condition or change in health will improve Outcome: Progressing   Problem: Fluid Volume: Goal: Ability to maintain a balanced intake and output will improve Outcome: Progressing   Problem: Nutritional: Goal: Maintenance of adequate nutrition will improve Outcome: Progressing   Problem: Tissue Perfusion: Goal: Adequacy of tissue perfusion will improve Outcome: Progressing   Problem: Clinical Measurements: Goal: Ability to maintain clinical measurements within normal limits will improve Outcome: Progressing Goal: Will remain free from infection Outcome: Progressing Goal: Diagnostic test results will improve Outcome: Progressing Goal: Respiratory complications will improve Outcome: Progressing Goal: Cardiovascular complication will be avoided Outcome: Progressing   Problem: Nutrition: Goal: Adequate nutrition will be maintained Outcome: Progressing   Problem: Coping: Goal: Level of anxiety will decrease Outcome: Progressing   Problem: Pain Managment: Goal: General experience of comfort will improve and/or be controlled Outcome: Progressing   Problem: Safety: Goal: Ability to remain free from injury will improve Outcome: Progressing

## 2024-09-24 NOTE — Progress Notes (Signed)
 Initial Nutrition Assessment  DOCUMENTATION CODES:  Non-severe (moderate) malnutrition in context of acute illness/injury  INTERVENTION:  Liberalize diet to regular to promote adequate oral intake Ensure Plus High Protein po BID, each supplement provides 350 kcal and 20 grams of protein. Renal MVI with minerals daily  NUTRITION DIAGNOSIS:  Moderate Malnutrition related to acute illness (recent c diff infection, ongoing nausea) as evidenced by energy intake < 75% for > 7 days, mild muscle depletion, severe muscle depletion.  GOAL:  Patient will meet greater than or equal to 90% of their needs   MONITOR:  PO intake, Supplement acceptance, Labs, Weight trends, I & O's  REASON FOR ASSESSMENT:  Consult Assessment of nutrition requirement/status  ASSESSMENT:  Pt admitted with c/o back pain, abdominal pain and dyspnea. PMH significant for ESRD on PD, HIV, aortic dissection s/p endovascular repair (08/25/23), recent h/o pituitary apoplexy, HTN, papillary thyroid  carcinoma s/p thyroidectomy, sarcoidosis, DM2. Recently admitted 09/07/24-09/12/24 for n/v/d and abdominal pain, empirically treated for c diff.   12/8: admitted to ICU with shock  On PD at baseline. Transitioned from HD in April.  Peritoneal cultures pending. Negative for peritonitis during recent admission.   Per Nephrology, concern for PD failure if uremic with creatinine of 14 and chronic n/v. Dehydration improving with addition of IVF.   Spoke with patient at bedside. Her sister and mom also present at time of visit. Nutrition history also provided with the assistance of sister.   Patient reports that her appetite has been poor for about the last 2 months given underlying chronic illnesses and recent c diff infection with associated n/v/d. Currently her diarrhea has improved but continues to endorse more loose stools. She no longer c/o emesis though mentions that nausea remains ongoing and is not relieved with zofran . She was  discharged on an appetite stimulant but sister reports she did not take d/t concern for side effects.   Pt continues to report eating very little. This morning she recalls eating a couple bites of oatmeal and peaches. She has been unable to receive foods she desires d/t renal diet restriction. Explained RD will liberalize diet to promote adequate oral intake in the setting of decreased appetite and ability to provide wider variety of desired menu items.   Pt had previously tried Ensure and Premier protein but reports that she had not been consuming them d/t cost. She likes the Ensure and is amenable to receive them during admission.   EDW: 74.5 kg (OP nephrology note 09/05/24) Patient reports that she has had significant weight loss though does not recall how much weight loss she has encountered.  Unfortunately, based on weight documentation, unable to confirm weight loss though difficult to assess given pt likely with fluid changes and recent report of retention secondary to steroid use.   Home CCPD regimen:  7x per week 5 exchanges, fill vol 2250, 1.5hr dwell time Usually does 1/2 1.5% bags and 1/2 2.5% bags  Hospital CCPD plan: 5 cycles 1.5 hours per dwell time 1.5% concentration 2.25L night fill volume PD on current admit regimen provides 225-293 kcal.  Medications: calcitriol , tivicay , descovy , solu-cortef , SSI 0-9 units q4h Drips: Albumin  D10 @ 5ml/hr Levo @ 10mcg/min IV abx Potassium chloride  infusion (repletion)  Labs:  Potassium 2.9 BUN 35 Cr 11.85 Anion gap 20 Phosphorus 3.9 (WDL) Mg 1.4 GFR 3 CBG's 68-152 x24 hours  NUTRITION - FOCUSED PHYSICAL EXAM:  Flowsheet Row Most Recent Value  Orbital Region Mild depletion  Upper Arm Region No depletion  Thoracic  and Lumbar Region No depletion  Buccal Region No depletion  Temple Region Mild depletion  Clavicle Bone Region No depletion  Clavicle and Acromion Bone Region Mild depletion  Scapular Bone Region Unable to  assess  Dorsal Hand Moderate depletion  Patellar Region Severe depletion  Anterior Thigh Region Severe depletion  Posterior Calf Region Severe depletion  Edema (RD Assessment) Mild  [generalized]  Hair Reviewed  Eyes Reviewed  Mouth Reviewed  Skin Reviewed  Nails Reviewed    Diet Order:   Diet Order             Diet regular Room service appropriate? Yes with Assist; Fluid consistency: Thin; Fluid restriction: 2000 mL Fluid  Diet effective now                   EDUCATION NEEDS:   Education needs have been addressed  Skin:  Skin Assessment: Reviewed RN Assessment  Last BM:  unknown/PTA  Height:   Ht Readings from Last 1 Encounters:  09/20/24 5' 5 (1.651 m)    Weight:   Wt Readings from Last 1 Encounters:  09/24/24 80.1 kg    Ideal Body Weight:  56.8 kg  BMI:  Body mass index is 29.39 kg/m.  Estimated Nutritional Needs:   Kcal:  1800-2000  Protein:  90-105g  Fluid:  1 L + UOP  Royce Maris, RDN, LDN Clinical Nutrition See AMiON for contact information.

## 2024-09-24 NOTE — Progress Notes (Signed)
 Pt receives out-pt PD care through [facility] . Will assist as needed.   Randine Mungo Dialysis Navigator [phone]

## 2024-09-24 NOTE — Evaluation (Signed)
 Occupational Therapy Evaluation Patient Details Name: [name] MRN: [id number] DOB: 12-29-1965 Today's Date: 09/24/2024   History of Present Illness   58 y.o. female presenting 12/8 from dialysis for back pain, abdominal pain, and dyspnea. BP found to be 64/30; admitted to ICU for shock. 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 PMH: ESRD on PD, anemia, anxiety, asthma, T2DM, aortic dissection s/p endovascular repair, GERD, Grave's disease, HTN, HLD, papillary thyroid  cancer s/p thyroidectomy (2018) PVD, falls, sarcoidosis, suppurative hydradenitis     Clinical Impressions PTA, pt lived alone and was independent for BADL, IADL, and driving until ~1 month ago with recent hospitalizations and reporting since then sister assisting with IADL more frequently as well as bathing. Upon eval, pt presents with decreased strength, balance, and activity tolerance. Pt needing up to set-up for UB ADL and min A for LB ADL. Pt to continue to benefit from acute OT services. Due to significant functional decline, recommending intensive multidisciplinary rehabilitation >3 hours/day to optimize safety and independence in ADL.        If plan is discharge home, recommend the following:   A little help with walking and/or transfers;A little help with bathing/dressing/bathroom;Assistance with cooking/housework;Assist for transportation;Help with stairs or ramp for entrance     Functional Status Assessment   Patient has had a recent decline in their functional status and demonstrates the ability to make significant improvements in function in a reasonable and predictable amount of time.     Equipment Recommendations   Tub/shower seat     Recommendations for Other Services         Precautions/Restrictions   Precautions Precautions: Fall Recall of Precautions/Restrictions:  Intact Restrictions Weight Bearing Restrictions Per Provider Order: No     Mobility Bed Mobility Overal bed mobility: Needs Assistance Bed Mobility: Supine to Sit, Sit to Supine     Supine to sit: Supervision Sit to supine: Supervision   General bed mobility comments: increased time    Transfers Overall transfer level: Needs assistance Equipment used: Rolling walker (2 wheels) Transfers: Sit to/from Stand Sit to Stand: +2 safety/equipment, Min assist           General transfer comment: Min A for balance stabilization. 2 person assist for safety with lines/leads.      Balance Overall balance assessment: Needs assistance Sitting-balance support: Bilateral upper extremity supported, Feet supported Sitting balance-Leahy Scale: Fair     Standing balance support: Bilateral upper extremity supported, Reliant on assistive device for balance, During functional activity Standing balance-Leahy Scale: Fair Standing balance comment: no overt LOB                           ADL either performed or assessed with clinical judgement   ADL Overall ADL's : Needs assistance/impaired Eating/Feeding: NPO   Grooming: Contact guard assist;Standing   Upper Body Bathing: Set up;Sitting   Lower Body Bathing: Minimal assistance;Sit to/from stand   Upper Body Dressing : Set up;Sitting   Lower Body Dressing: Minimal assistance;Sit to/from stand   Toilet Transfer: Contact guard assist;Rolling walker (2 wheels);Ambulation           Functional mobility during ADLs: Contact guard assist;Rolling walker (2 wheels)       Vision Ability to See in Adequate Light: 0 Adequate Patient Visual Report: No change from baseline Vision Assessment?: No apparent visual deficits (has glasses but does not wear them)  Perception         Praxis         Pertinent Vitals/Pain Pain Assessment Pain Assessment: Faces Faces Pain Scale: No hurt     Extremity/Trunk Assessment Upper  Extremity Assessment Upper Extremity Assessment: Generalized weakness   Lower Extremity Assessment Lower Extremity Assessment: Defer to PT evaluation   Cervical / Trunk Assessment Cervical / Trunk Assessment: Normal   Communication Communication Communication: No apparent difficulties   Cognition Arousal: Alert Behavior During Therapy: WFL for tasks assessed/performed Cognition: No apparent impairments             OT - Cognition Comments: following commands WLF, some decr insight                 Following commands: Intact       Cueing  General Comments   Cueing Techniques: Verbal cues  VSS on RA   Exercises     Shoulder Instructions      Home Living Family/patient expects to be discharged to:: Private residence Living Arrangements: Alone Available Help at Discharge: Family;Available 24 hours/day Type of Home: Other(Comment) (townhome) Home Access: Stairs to enter [employer] of Steps: 2 Entrance Stairs-Rails: None Home Layout: Two level;1/2 bath on main level;Bed/bath upstairs;Able to live on main level with bedroom/bathroom Alternate Level Stairs-Number of Steps: 10 Alternate Level Stairs-Rails: Right;Left;Can reach both Bathroom Shower/Tub: Chief Strategy Officer: Standard Bathroom Accessibility: Yes   Home Equipment: [employer] (2 wheels)          Prior Functioning/Environment Prior Level of Function : Independent/Modified Independent             Mobility Comments: recently using RW, spending more and more time upstairs in her room. ADLs Comments: Mod I with ADL's prior to one month ago; for the past month, sister assisting intermittently, predominantly with bathing and bringing meals    OT Problem List: Decreased strength;Decreased activity tolerance;Impaired balance (sitting and/or standing);Decreased knowledge of use of DME or AE   OT Treatment/Interventions: Self-care/ADL training;Therapeutic exercise;DME  and/or AE instruction;Therapeutic activities;Patient/family education;Balance training      OT Goals(Current goals can be found in the care plan section)   Acute Rehab OT Goals Patient Stated Goal: get better OT Goal Formulation: With patient Time For Goal Achievement: 10/08/24 Potential to Achieve Goals: Good   OT Frequency:  Min 2X/week    Co-evaluation PT/OT/SLP Co-Evaluation/Treatment: Yes Reason for Co-Treatment: To address functional/ADL transfers;Other (comment) (pt fatigue) PT goals addressed during session: Mobility/safety with mobility;Balance;Proper use of DME OT goals addressed during session: ADL's and self-care;Proper use of Adaptive equipment and DME      AM-PAC OT 6 Clicks Daily Activity     Outcome Measure Help from another person eating meals?: Total Help from another person taking care of personal grooming?: A Little Help from another person toileting, which includes using toliet, bedpan, or urinal?: A Little Help from another person bathing (including washing, rinsing, drying)?: A Little Help from another person to put on and taking off regular upper body clothing?: A Little Help from another person to put on and taking off regular lower body clothing?: A Little 6 Click Score: 16   End of Session Equipment Utilized During Treatment: Gait belt;Rolling walker (2 wheels)  Activity Tolerance: Patient tolerated treatment well Patient left: in bed;with call bell/phone within reach;with bed alarm set;with family/visitor present  OT Visit Diagnosis: Unsteadiness on feet (R26.81);Muscle weakness (generalized) (M62.81)  Time: 8684-8646 OT Time Calculation (min): 38 min Charges:  OT General Charges $OT Visit: 1 Visit OT Evaluation $OT Eval Low Complexity: 1 Low OT Treatments $Self Care/Home Management : 8-22 mins  Elma JONETTA Lebron FREDERICK, OTR/L [name] Acute Rehabilitation Office: [phone]   Elma JONETTA Lebron 09/24/2024, 3:34 PM

## 2024-09-24 NOTE — Progress Notes (Addendum)
 eLink Physician-Brief Progress Note Patient Name: [name] DOB: Sep 11, 1966 MRN: [id number]   Date of Service  09/24/2024  HPI/Events of Note  Notified that glucose was down to 68.  Given D50 per protocol.   eICU Interventions  Placed order to start D10W at 30ml/hr.  Will continue to monitor serial glucose, adjust drips as needed.         Celie Desrochers M DELA CRUZ 09/24/2024, 12:43 AM  5:09 AM Notified of Mg 1.4, K 2.9 Placed order for magnesium  sulfate 2g IV, potassium chloride  10meq x 4 doses.  Would be cautious in repleting electrolytes given ESRD Will continue to monitor serial labs.

## 2024-09-24 NOTE — Evaluation (Signed)
 Physical Therapy Evaluation Patient Details Name: [name] MRN: [id number] DOB: 03-15-1966 Today's Date: 09/24/2024  History of Present Illness  58 y.o. female presenting 12/8 from dialysis for back pain, abdominal pain, and dyspnea. BP found to be 64/30; admitted to ICU for shock. 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 PMH: ESRD on PD, anemia, anxiety, asthma, T2DM, aortic dissection s/p endovascular repair, GERD, Grave's disease, HTN, HLD, papillary thyroid  cancer s/p thyroidectomy (2018) PVD, falls, sarcoidosis, suppurative hydradenitis  Clinical Impression  Pt is currently presenting at Min A +2 for sit to stand and gait with RW. Pt was supervision for sitting to supine. Family is present and very supportive. Pt was previously mod I with RW use near hospitalization and before that Ind without RW. Due to pt current functional status, home set up and available assistance at home recommending skilled physical therapy services > 3 hours/day in order to address strength, balance and functional mobility to decrease risk for falls, injury, immobility, skin break down and re-hospitalization.          If plan is discharge home, recommend the following: A little help with walking and/or transfers;Assist for transportation;Help with stairs or ramp for entrance;Assistance with cooking/housework     Equipment Recommendations None recommended by PT  Recommendations for Other Services  Rehab consult    Functional Status Assessment Patient has had a recent decline in their functional status and demonstrates the ability to make significant improvements in function in a reasonable and predictable amount of time.     Precautions / Restrictions Precautions Precautions: Fall Recall of Precautions/Restrictions: Intact Restrictions Weight Bearing Restrictions Per Provider Order: No      Mobility  Bed  Mobility Overal bed mobility: Needs Assistance Bed Mobility: Sit to Supine       Sit to supine: Supervision, Used rails        Transfers Overall transfer level: Needs assistance Equipment used: Rolling walker (2 wheels) Transfers: Sit to/from Stand Sit to Stand: +2 safety/equipment, Min assist           General transfer comment: Min A for balance stabilization. 2 person assist for safety with lines/leads.    Ambulation/Gait Ambulation/Gait assistance: Contact guard assist, Min assist, +2 safety/equipment Gait Distance (Feet): 80 Feet Assistive device: Rolling walker (2 wheels) Gait Pattern/deviations: Step-through pattern, Decreased stride length Gait velocity: decreased Gait velocity interpretation: <1.31 ft/sec, indicative of household ambulator   General Gait Details: pt with 2x seated rest break. Pt would begin to fatigue and lean on RW     Balance Overall balance assessment: Needs assistance Sitting-balance support: Bilateral upper extremity supported, Feet supported Sitting balance-Leahy Scale: Fair     Standing balance support: Bilateral upper extremity supported, Reliant on assistive device for balance, During functional activity Standing balance-Leahy Scale: Fair Standing balance comment: no overt LOB         Pertinent Vitals/Pain Pain Assessment Pain Assessment: Faces Faces Pain Scale: No hurt    Home Living Family/patient expects to be discharged to:: Private residence Living Arrangements: Alone Available Help at Discharge: Family;Available 24 hours/day Type of Home: Other(Comment) (townhome) Home Access: Stairs to enter Entrance Stairs-Rails: None Entrance Stairs-Number of Steps: 2 Alternate Level Stairs-Number of Steps: 10 Home Layout: Two level;1/2 bath on main level;Bed/bath upstairs;Able to live on main level with bedroom/bathroom Home Equipment: Rolling Walker (2 wheels)      Prior Function Prior Level of Function :  Independent/Modified Independent  Mobility Comments: recently using RW, spending more and more time upstairs in her room. ADLs Comments: Mod I with ADL's     Extremity/Trunk Assessment   Upper Extremity Assessment Upper Extremity Assessment: Defer to OT evaluation    Lower Extremity Assessment Lower Extremity Assessment: Generalized weakness    Cervical / Trunk Assessment Cervical / Trunk Assessment: Normal  Communication   Communication Communication: No apparent difficulties    Cognition Arousal: Alert Behavior During Therapy: WFL for tasks assessed/performed   PT - Cognitive impairments: No apparent impairments     Following commands: Intact       Cueing Cueing Techniques: Verbal cues     General Comments General comments (skin integrity, edema, etc.): vital signs stable on room air.        Assessment/Plan    PT Assessment Patient needs continued PT services  PT Problem List Decreased strength;Decreased activity tolerance;Decreased balance;Decreased mobility       PT Treatment Interventions DME instruction;Balance training;Gait training;Stair training;Functional mobility training;Patient/family education;Therapeutic activities;Therapeutic exercise    PT Goals (Current goals can be found in the Care Plan section)  Acute Rehab PT Goals Patient Stated Goal: to improve mobility and return home. PT Goal Formulation: With patient Time For Goal Achievement: 10/08/24 Potential to Achieve Goals: Good    Frequency Min 3X/week     Co-evaluation PT/OT/SLP Co-Evaluation/Treatment: Yes Reason for Co-Treatment: To address functional/ADL transfers;Other (comment) (pt fatigue) PT goals addressed during session: Mobility/safety with mobility;Balance;Proper use of DME OT goals addressed during session: ADL's and self-care;Proper use of Adaptive equipment and DME       AM-PAC PT 6 Clicks Mobility  Outcome Measure Help needed turning from your  back to your side while in a flat bed without using bedrails?: A Little Help needed moving from lying on your back to sitting on the side of a flat bed without using bedrails?: A Little Help needed moving to and from a bed to a chair (including a wheelchair)?: A Little Help needed standing up from a chair using your arms (e.g., wheelchair or bedside chair)?: A Little Help needed to walk in hospital room?: A Lot Help needed climbing 3-5 steps with a railing? : Total 6 Click Score: 15    End of Session Equipment Utilized During Treatment: Gait belt Activity Tolerance: Patient tolerated treatment well Patient left: in bed;with call bell/phone within reach;with bed alarm set;with family/visitor present Nurse Communication: Mobility status PT Visit Diagnosis: Unsteadiness on feet (R26.81);Other abnormalities of gait and mobility (R26.89);Muscle weakness (generalized) (M62.81)    Time: 8671-8646 PT Time Calculation (min) (ACUTE ONLY): 25 min   Charges:   PT Evaluation $PT Eval Low Complexity: 1 Low   PT General Charges $$ ACUTE PT VISIT: 1 Visit       Dorothyann Maier, DPT, CLT  Acute Rehabilitation Services Office: [phone] (Secure chat preferred)   Dorothyann VEAR Maier 09/24/2024, 3:11 PM

## 2024-09-24 NOTE — Progress Notes (Signed)
 Echocardiogram 2D Echocardiogram has been performed.  Juliene JINNY Rucks 09/24/2024, 11:23 AM

## 2024-09-25 ENCOUNTER — Telehealth: Payer: Self-pay

## 2024-09-25 DIAGNOSIS — I5020 Unspecified systolic (congestive) heart failure: Secondary | ICD-10-CM

## 2024-09-25 DIAGNOSIS — I447 Left bundle-branch block, unspecified: Secondary | ICD-10-CM | POA: Diagnosis not present

## 2024-09-25 DIAGNOSIS — I340 Nonrheumatic mitral (valve) insufficiency: Secondary | ICD-10-CM

## 2024-09-25 DIAGNOSIS — I214 Non-ST elevation (NSTEMI) myocardial infarction: Secondary | ICD-10-CM

## 2024-09-25 DIAGNOSIS — E440 Moderate protein-calorie malnutrition: Secondary | ICD-10-CM | POA: Insufficient documentation

## 2024-09-25 LAB — GLUCOSE, CAPILLARY
Glucose-Capillary: 101 mg/dL — ABNORMAL HIGH (ref 70–99)
Glucose-Capillary: 103 mg/dL — ABNORMAL HIGH (ref 70–99)
Glucose-Capillary: 117 mg/dL — ABNORMAL HIGH (ref 70–99)
Glucose-Capillary: 124 mg/dL — ABNORMAL HIGH (ref 70–99)
Glucose-Capillary: 139 mg/dL — ABNORMAL HIGH (ref 70–99)
Glucose-Capillary: 143 mg/dL — ABNORMAL HIGH (ref 70–99)
Glucose-Capillary: 222 mg/dL — ABNORMAL HIGH (ref 70–99)

## 2024-09-25 LAB — BASIC METABOLIC PANEL WITH GFR
Anion gap: 17 — ABNORMAL HIGH (ref 5–15)
BUN: 33 mg/dL — ABNORMAL HIGH (ref 6–20)
CO2: 20 mmol/L — ABNORMAL LOW (ref 22–32)
Calcium: 8.4 mg/dL — ABNORMAL LOW (ref 8.9–10.3)
Chloride: 93 mmol/L — ABNORMAL LOW (ref 98–111)
Creatinine, Ser: 11.08 mg/dL — ABNORMAL HIGH (ref 0.44–1.00)
GFR, Estimated: 4 mL/min — ABNORMAL LOW (ref >60)
Glucose, Bld: 117 mg/dL — ABNORMAL HIGH (ref 70–99)
Potassium: 3.4 mmol/L — ABNORMAL LOW (ref 3.5–5.1)
Sodium: 130 mmol/L — ABNORMAL LOW (ref 135–145)

## 2024-09-25 LAB — CBC
HCT: 27.5 % — ABNORMAL LOW (ref 36.0–46.0)
Hemoglobin: 9.1 g/dL — ABNORMAL LOW (ref 12.0–15.0)
MCH: 36 pg — ABNORMAL HIGH (ref 26.0–34.0)
MCHC: 33.1 g/dL (ref 30.0–36.0)
MCV: 108.7 fL — ABNORMAL HIGH (ref 80.0–100.0)
Platelets: 180 K/uL (ref 150–400)
RBC: 2.53 MIL/uL — ABNORMAL LOW (ref 3.87–5.11)
RDW: 14.8 % (ref 11.5–15.5)
WBC: 11.7 K/uL — ABNORMAL HIGH (ref 4.0–10.5)
nRBC: 0.3 % — ABNORMAL HIGH (ref 0.0–0.2)

## 2024-09-25 LAB — INSULIN-LIKE GROWTH FACTOR: Somatomedin C: 38 ng/mL — ABNORMAL LOW (ref 60–207)

## 2024-09-25 LAB — FOLLICLE STIMULATING HORMONE: FSH: 1.1 m[IU]/mL

## 2024-09-25 LAB — PROLACTIN: Prolactin: 11.9 ng/mL (ref 3.6–25.2)

## 2024-09-25 LAB — LUTEINIZING HORMONE: LH: <0.3 m[IU]/mL

## 2024-09-25 LAB — MAGNESIUM: Magnesium: 2.6 mg/dL — ABNORMAL HIGH (ref 1.7–2.4)

## 2024-09-25 LAB — PHOSPHORUS: Phosphorus: 3.7 mg/dL (ref 2.5–4.6)

## 2024-09-25 MED ORDER — HEPARIN SODIUM (PORCINE) 1000 UNIT/ML IJ SOLN
INTRAPERITONEAL | Status: DC | PRN
  Filled 2024-09-25: qty 6000

## 2024-09-25 MED ORDER — FREE WATER
250.0000 mL | Freq: Once | Status: AC
  Administered 2024-09-26: 250 mL via ORAL

## 2024-09-25 MED ORDER — ASPIRIN 81 MG PO CHEW
81.0000 mg | CHEWABLE_TABLET | Freq: Every day | ORAL | Status: DC
  Administered 2024-09-25 – 2024-09-27 (×3): 81 mg via ORAL
  Filled 2024-09-25 (×3): qty 1

## 2024-09-25 MED ORDER — POTASSIUM CHLORIDE CRYS ER 20 MEQ PO TBCR
40.0000 meq | EXTENDED_RELEASE_TABLET | Freq: Once | ORAL | Status: AC
  Administered 2024-09-25: 40 meq via ORAL
  Filled 2024-09-25: qty 2

## 2024-09-25 MED ORDER — DELFLEX-LC/2.5% DEXTROSE 394 MOSM/L IP SOLN: INTRAPERITONEAL | Status: DC

## 2024-09-25 MED ORDER — HYDROCORTISONE SOD SUC (PF) 100 MG IJ SOLR
50.0000 mg | Freq: Two times a day (BID) | INTRAMUSCULAR | Status: DC
  Administered 2024-09-25 – 2024-09-27 (×4): 50 mg via INTRAVENOUS
  Filled 2024-09-25: qty 2
  Filled 2024-09-25 (×2): qty 1
  Filled 2024-09-25: qty 2
  Filled 2024-09-25: qty 1

## 2024-09-25 MED ORDER — OXYCODONE HCL 5 MG PO TABS
5.0000 mg | ORAL_TABLET | Freq: Four times a day (QID) | ORAL | Status: DC | PRN
  Administered 2024-09-27: 5 mg via ORAL
  Filled 2024-09-25: qty 1

## 2024-09-25 MED ORDER — DELFLEX-LC/1.5% DEXTROSE 344 MOSM/L IP SOLN: INTRAPERITONEAL | Status: DC

## 2024-09-25 NOTE — Telephone Encounter (Signed)
 Spoke with patient she is currently admitted to [facility].  Will have LPN cancel the visit.

## 2024-09-25 NOTE — Plan of Care (Signed)
°  Problem: Education: Goal: Ability to describe self-care measures that may prevent or decrease complications (Diabetes Survival Skills Education) will improve Outcome: Progressing   Problem: Coping: Goal: Ability to adjust to condition or change in health will improve Outcome: Progressing   Problem: Health Behavior/Discharge Planning: Goal: Ability to identify and utilize available resources and services will improve Outcome: Progressing   Problem: Metabolic: Goal: Ability to maintain appropriate glucose levels will improve Outcome: Progressing   Problem: Skin Integrity: Goal: Risk for impaired skin integrity will decrease Outcome: Progressing   Problem: Health Behavior/Discharge Planning: Goal: Ability to manage health-related needs will improve Outcome: Progressing   Problem: Skin Integrity: Goal: Risk for impaired skin integrity will decrease Outcome: Progressing

## 2024-09-25 NOTE — Consult Note (Addendum)
 Cardiology Consultation   Patient ID: [name] MRN: [id number]; DOB: Sep 03, 1966  Admit date: 09/23/2024 Date of Consult: 09/25/2024  PCP:  Gretta Comer POUR, NP   [facility] Providers Cardiologist:  Emeline FORBES Calender, DO       Patient Profile: [name] is a 58 y.o. female with a hx of with a history of diabetes, coronary artery calcifications on prior CTs, ESRD on peritoneal dialysis, prior aortic dissection, upper extremity DVT, mild OSA, HIV, hypertension, Graves' disease, and right breast cancer who is being seen 09/25/2024 for the evaluation of abnormal echocardiogram at the request of Verdon Gore MD.  History of Present Illness: [name] is a 58 year old female with prior cardiac history listed below.  Following a biopsy of the right breast on 04/2018 the patient was diagnosed with stage Ib invasive ductal carcinoma.  Patient was initially started on chemotherapy with carboplatin , docetaxel , trastuzumab  and Pertuzumab .  After 2 cycles Pertuzumab  was stopped because of diarrhea.  Following this was placed on trastuzumab  for 6 months.  A lumpectomy was done on 09/2018.  Adjuvant radiation was completed on 5/20.  Herceptin  was completed on 10/2019.  In 2019 in 2020 the patient had 4 echocardiograms her LVEF and these echocardiograms was between 50 and 65%.  The last echocardiogram on 02/2019 did showed an abnormal longitudinal strain of -15.7.  The patient underwent a left and right cardiac catheterization on 02/2020 that showed normal coronary arteries and mild pulmonary hypertension (PA = 41/12 (26) )   Most recent echo prior to admission was done on 01/2023 and showed a normal LVEF of 55 to 60%, moderate concentric LVH, G2 DD, mild to moderate MR, aortic valve calcification without any evidence of stenosis.  On 01/2023 the patient was hospitalized for an acute type B aortic dissection. Vascular surgery did a thoracic endovascular aortic repair.   On  08/2023 the patient had posterior a median sternotomy with aortic arch branching with bypasses to the innominate, left carotid, and left subclavian arteries.  On 07/2024 was hospitalized for a headache.  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  There are concerns for adrenal insufficiency as the patient had a recent pituitary hemorrhage  On interview patient reported that she came to the hospital because she was having a new and worsening shortness of breath and was feeling poorly.  Denies any new or worsening chest pain, shoulder pain, neck pain, fever, chills, diaphoresis, orthopnea, or vomiting.  Denies any alcohol  use, nicotine  use, illicit substance use.  Stated that she would not be able to walk around a large store like [employer] because of how weak she has been with her multiple recent health conditions and her back pain.  Labs showed TSH within normal limits, high-sensitivity troponins 338, 392, 331, 341, sodium of 130, potassium of 3.4, BUN of 33, creatinine of 11, magnesium  of 2.6, WBC count of 11.7, and anemia with a hemoglobin of 9.1.  CTA showed no significant change in endoluminal graft and aortic atherosclerosis.  EKG showed sinus rhythm with a rate of 69, left bundle branch block, and a PAC.  The patient has had a left bundle branch block on prior EKGs.  Echocardiogram on 09/24/2024 showed a decreased LVEF of 30 to 35%, with apical akinesis, G2 DD, normal RV systolic function, severely dilated left atrium, moderate to severe MR, normal aortic valve function, and IVC with greater than 50% respiratory  variability.  Past Medical History:  Diagnosis Date   Acute pain of right shoulder due to trauma 06/08/2023   Acute pancreatitis 10/23/2021   Acute renal failure superimposed on stage 4 chronic kidney disease (HCC) 10/24/2021   Allergy    Anemia    Normocytic   Antibiotic-induced yeast  infection 09/28/2020   Anxiety    Asthma    Bronchitis 2005   Bursitis of left shoulder 09/06/2019   CKD (chronic kidney disease)    Dialysis on Mon-Wed- Fri   CLASS 1-EXOPHTHALMOS-THYROTOXIC 02/08/2007   Cognitive dysfunction 02/21/2020   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  Genes Analyzed (43 total): APC*, ATM*, AXIN2, BARD1, BMPR1A, BRCA1*, BRCA2*, BRIP1*, CDH1*, CDK4, CDKN2A, CHEK2*, DICER1, GALNT12, HOXB13, MEN1, MLH1*, MRE11A, MSH2*, MSH3, MSH6*, MUTYH*, NBN, NF1*, NTHL1, PALB2*, PMS2*, POLD1, POLE, PTEN*, RAD50, RAD51C*, RAD51D*, RET, SDHB, SDHD, SMAD4, SMARCA4, STK11 and TP53* (sequencing and deletion/duplication); EGFR (s   GERD 07/24/2006   GRAVE'S DISEASE 01/01/2008   History of hidradenitis suppurativa    History of kidney stones    History of thrush    HIV DISEASE 07/24/2006   dx March 05   Hyperlipidemia    HYPERTENSION 07/24/2006   Hyperthyroidism 08/2006   Grave's Disease -diffuse radiotracer uptake 08/25/06 Thyroid  scan-Cold nodule to R lower lobe of thyrorid   Ileus (HCC) 02/14/2023   Left bundle branch block (LBBB)    Malnutrition of moderate degree 02/08/2023   Menometrorrhagia    hx of   Nephrolithiasis    Panniculitis 05/12/2020   Papillary adenocarcinoma of thyroid  (HCC)    METASTATIC PAPILLARY THYROID  CARCINOMA per 01/12/17 FNA left cervical LN; s/p completion  thyroidectomy, limited left neck dissection 04/12/17 with pathology negative for malignancy.   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 Completed 2 yrs Prednisone  after lung bx confirmation. No symptoms since then.   Sebaceous cyst 04/21/2020   Suppurative hidradenitis    Thyroid  cancer (HCC)    THYROID  NODULE, RIGHT 02/08/2007    Past Surgical History:  Procedure Laterality Date   AORTIC ARCH DEBRANCHING N/A 08/25/2023   Procedure: AORTIC ARCH DEBRANCHING USING 12X8X8MM HEMASHIELD GRAFT;  Surgeon: Lucas Dorise POUR, MD;  Location: [facility];  Service: Open Heart Surgery;  Laterality: N/A;  with bypasses to the innominate and left common carotid arteries   APPLICATION OF WOUND VAC N/A 01/20/2021   Procedure: APPLICATION OF WOUND VAC;  Surgeon: Lowery Estefana RAMAN, DO;  Location: [facility];  Service: Plastics;  Laterality: N/A;  BREAST EXCISIONAL BIOPSY Right 04/26/2018   right axilla negative   BREAST EXCISIONAL BIOPSY Left 04/26/2018   left axilla negative   BREAST LUMPECTOMY Right 10/03/2018   malignant   BREAST LUMPECTOMY WITH RADIOACTIVE SEED AND SENTINEL LYMPH NODE BIOPSY Right 10/03/2018   Procedure: RIGHT BREAST LUMPECTOMY WITH RADIOACTIVE SEED AND SENTINEL LYMPH NODE MAPPING;  Surgeon: Vanderbilt Ned, MD;  Location: [facility];  Service: General;  Laterality: Right;   BREAST SURGERY  1997   Breast Reduction    CAPD INSERTION N/A 12/21/2023   Procedure: LAPAROSCOPIC INSERTION CONTINUOUS AMBULATORY PERITONEAL DIALYSIS  (CAPD) CATHETER; LAPAROSCOPIC OMENTUMPEXY;  Surgeon: Magda Ned SAILOR, MD;  Location: [facility];  Service: Vascular;   Laterality: N/A;   CYSTOSCOPY W/ URETERAL STENT REMOVAL  11/09/2012   Procedure: CYSTOSCOPY WITH STENT REMOVAL;  Surgeon: Ricardo Likens, MD;  Location: [facility];  Service: Urology;  Laterality: Right;   CYSTOSCOPY WITH RETROGRADE PYELOGRAM, URETEROSCOPY AND STENT PLACEMENT  11/09/2012   Procedure: CYSTOSCOPY WITH RETROGRADE PYELOGRAM, URETEROSCOPY AND STENT PLACEMENT;  Surgeon: Ricardo Likens, MD;  Location: [facility];  Service: Urology;  Laterality: Left;  LEFT URETEROSCOPY, STONE MANIPULATION, left STENT exchange    CYSTOSCOPY WITH STENT PLACEMENT  10/02/2012   Procedure: CYSTOSCOPY WITH STENT PLACEMENT;  Surgeon: Ricardo Likens, MD;  Location: [facility];  Service: Urology;  Laterality: Left;   DEBRIDEMENT AND CLOSURE WOUND N/A 01/20/2021   Procedure: Excision of abdominal wound with closure;  Surgeon: Lowery Estefana RAMAN, DO;  Location: [facility];  Service: Plastics;  Laterality: N/A;   DILATION AND CURETTAGE OF UTERUS  11/2002   s/p for 1st trimester nonviable pregnancy   ESOPHAGOGASTRODUODENOSCOPY N/A 09/11/2024   Procedure: EGD (ESOPHAGOGASTRODUODENOSCOPY);  Surgeon: Abran Norleen SAILOR, MD;  Location: [facility];  Service: Gastroenterology;  Laterality: N/A;   EYE SURGERY     sty under eyelid   INCISE AND DRAIN ABCESS  08/2002   s/p I &D for righ inframmary fold hidradenitis   INCISION AND DRAINAGE PERITONSILLAR ABSCESS  12/2001   IR CV LINE INJECTION  06/07/2018   IR FLUORO GUIDE CV LINE RIGHT  01/30/2023   IR IMAGING GUIDED PORT INSERTION  06/20/2018   IR REMOVAL TUN ACCESS W/ PORT W/O FL MOD SED  06/20/2018   IR US  GUIDE VASC ACCESS RIGHT  01/30/2023   IRRIGATION AND DEBRIDEMENT ABSCESS  01/31/2012   Procedure: IRRIGATION AND DEBRIDEMENT ABSCESS;  Surgeon: Alm VEAR Angle, MD;  Location: [facility];  Service: General;  Laterality: Right;  right breast and axilla    LAPAROSCOPIC REPOSITIONING CAPD CATHETER Left 01/10/2024   Procedure: Removal of peritoneal dialysis Catheter.;   Surgeon: Magda Ned SAILOR, MD;  Location: [facility] OR;  Service: Vascular;  Laterality: Left;   LAPAROSCOPY N/A 01/10/2024   Procedure: LAPAROSCOPY, DIAGNOSTIC;  Surgeon: Magda Ned SAILOR, MD;  [facility];  Service: Vascular;  Laterality: N/A;   NEPHROLITHOTOMY  10/02/2012   Procedure: NEPHROLITHOTOMY PERCUTANEOUS;  Surgeon: Ricardo Likens, MD;  Location: [facility];  Service: Urology;  Laterality: Right;  First Stage Percutaneous Nephrolithotomy with Surgeon Access, Left Ureteral Stent     NEPHROLITHOTOMY  10/04/2012   Procedure: NEPHROLITHOTOMY PERCUTANEOUS SECOND LOOK;  Surgeon: Ricardo Likens, MD;  Location: [facility];  Service: Urology;  Laterality: Right;      NEPHROLITHOTOMY  10/08/2012   Procedure: NEPHROLITHOTOMY PERCUTANEOUS;  Surgeon: Ricardo Likens, MD;  Location: [facility];  Service: Urology;  Laterality: Right;  THIRD STAGE, nephrostomy tube exchange x 2   NEPHROLITHOTOMY  10/11/2012  Procedure: NEPHROLITHOTOMY PERCUTANEOUS SECOND LOOK;  Surgeon: Ricardo Likens, MD;  Location: [facility];  Service: Urology;  Laterality: Right;  RIGHT 4 STAGE PERCUTANOUS NEPHROLITHOTOMY, right URETEROSCOPY WITH HOLMIUM LASER    PANNICULECTOMY N/A 12/21/2020   Procedure: PANNICULECTOMY;  Surgeon: Lowery Estefana RAMAN, DO;  Location: [facility];  Service: Plastics;  Laterality: N/A;   PERCUTANEOUS NEPHROSTOLITHOTOMY  04/2022   PORT-A-CATH REMOVAL N/A 07/16/2020   Procedure: REMOVAL PORT-A-CATH;  Surgeon: Vanderbilt Ned, MD;  Location: [facility];  Service: General;  Laterality: N/A;   PORTACATH PLACEMENT Left 05/17/2018   Procedure: INSERTION PORT-A-CATH;  Surgeon: Vernetta Berg, MD;  Location: [facility];  Service: General;  Laterality: Left;   RADICAL NECK DISSECTION  04/12/2017   limited/notes 04/12/2017   RADICAL NECK DISSECTION N/A 04/12/2017   Procedure: RADICAL NECK DISSECTION;  Surgeon: Carlie Clark, MD;  Location: [facility] OR;  Service: ENT;  Laterality: N/A;  limited neck  dissection 2 hours total   REDUCTION MAMMAPLASTY Bilateral 1998   RIGHT/LEFT HEART CATH AND CORONARY ANGIOGRAPHY N/A 03/12/2020   Procedure: RIGHT/LEFT HEART CATH AND CORONARY ANGIOGRAPHY;  Surgeon: Cherrie Toribio SAUNDERS, MD;  [facility]: [facility];  Service: Cardiovascular;  Laterality: N/A;   [place]  1994   TEE WITHOUT CARDIOVERSION N/A 08/25/2023   Procedure: TRANSESOPHAGEAL ECHOCARDIOGRAM;  Surgeon: Lucas Dorise POUR, MD;  Location: [facility];  Service: Open Heart Surgery;  Laterality: N/A;   TENCKHOFF CATHETER INSERTION Left 01/10/2024   Procedure: INSERTION, CATHETER, DIALYSIS, PERITONEAL;  Surgeon: Magda Ned SAILOR, MD;  Location: [facility];  Service: Vascular;  Laterality: Left;   THORACIC AORTIC ENDOVASCULAR STENT GRAFT N/A 02/02/2023   Procedure: THORACIC AORTIC ENDOVASCULAR STENT GRAFT;  Surgeon: Lanis Fonda BRAVO, MD;  Location: [facility] OR;  Service: Vascular;  Laterality: N/A;   THORACIC AORTIC ENDOVASCULAR STENT GRAFT N/A 08/25/2023   Procedure: THORACIC AORTIC ENDOVASCULAR STENT GRAFT;  Surgeon: Lanis Fonda BRAVO, MD;  Location: [facility] OR;  Service: Vascular;  Laterality: N/A;   THYROIDECTOMY  04/12/2017   completion/notes 04/12/2017   THYROIDECTOMY N/A 04/12/2017   Procedure: THYROIDECTOMY;  Surgeon: Carlie Clark, MD;  Location: [facility] OR;  Service: ENT;  Laterality: N/A;  Completion Thyroidectomy   TOTAL THYROIDECTOMY  2010   ULTRASOUND GUIDANCE FOR VASCULAR ACCESS Right 08/25/2023   Procedure: ULTRASOUND GUIDANCE FOR VASCULAR ACCESS, RIGHT FEMORAL ARTERY;  Surgeon: Lanis Fonda BRAVO, MD;  Location: [facility] OR;  Service: Vascular;  Laterality: Right;     Home Medications:  Prior to Admission medications   Medication Sig Start Date End Date Taking? Authorizing Provider  acetaminophen  (TYLENOL ) 500 MG tablet Take 1,000 mg by mouth every 6 (six) hours as needed for mild pain.   Yes [provider]  allopurinol  (ZYLOPRIM ) 100 MG tablet TAKE 1 TABLET (100 MG TOTAL) BY MOUTH DAILY. FOR GOUT PREVENTION  04/11/24  Yes Clark, Katherine K, NP  aspirin  81 MG chewable tablet Chew 81 mg by mouth daily.   Yes [provider]  AURYXIA  1 GM 210 MG(Fe) tablet Take 210 mg by mouth 3 (three) times daily with meals. 04/05/23  Yes [provider]  busPIRone  (BUSPAR ) 5 MG tablet TAKE 1 TABLET BY MOUTH TWICE A DAY FOR ANXIETY 08/26/24  Yes Gretta Comer POUR, NP  dolutegravir  ([name] ) 50 MG tablet Take 1 tablet (50 mg total) by mouth daily. 05/13/24  Yes Melvenia Corean SAILOR, NP  emtricitabine -tenofovir  AF (DESCOVY ) 200-25 MG tablet Take 1 tablet by mouth daily. 05/13/24  Yes Melvenia Corean SAILOR, NP  fluticasone  (FLOVENT  HFA) 110 MCG/ACT inhaler Inhale 1 puff into the lungs 2 (two) times daily.   Yes [provider]  levocetirizine (XYZAL ) 5 MG tablet TAKE 1 TABLET BY MOUTH EVERY DAY IN THE EVENING FOR ALLERGIES 05/12/24  Yes Clark, Katherine K, NP  levothyroxine  (SYNTHROID ) 150 MCG tablet TAKE 1 TABLET BY MOUTH EVERY DAY BEFORE BREAKFAST 09/16/24  Yes Trixie File, MD  lidocaine  (LIDODERM ) 5 % PLACE 1 PATCH ONTO THE SKIN DAILY. REMOVE & DISCARD PATCH WITHIN 12 HOURS OR AS DIRECTED BY MD Patient taking differently: Place 1 patch onto the skin daily as needed. Remove & Discard patch within 12 hours or as directed by MD 04/21/24  Yes Gretta Comer POUR, NP  minocycline  (MINOCIN ) 100 MG capsule Take 1 capsule (100 mg total) by mouth 2 (two) times daily. 03/13/24  Yes Melvenia Corean SAILOR, NP  omeprazole  (PRILOSEC) 40 MG capsule Take 1 capsule (40 mg total) by mouth 2 (two) times daily. for heartburn. 09/12/24  Yes Ghimire, Donalda HERO, MD  ondansetron  (ZOFRAN -ODT) 4 MG disintegrating tablet Take 1 tablet (4 mg total) by mouth every 8 (eight) hours as needed for nausea or vomiting. 08/28/24  Yes Clark, Katherine K, NP  rosuvastatin  (CRESTOR ) 10 MG tablet TAKE 1 TABLET BY MOUTH EVERY DAY FOR CHOLESTEROL 03/21/24  Yes Clark, Katherine K, NP  sevelamer  carbonate (RENVELA ) 800 MG tablet Take 800 mg by mouth 3  (three) times daily with meals. 08/07/24  Yes [provider]  tiZANidine  (ZANAFLEX ) 4 MG tablet Take 1-2 tablets (4-8 mg total) by mouth every 8 (eight) hours as needed for muscle spasms. 09/20/24  Yes Gretta Comer POUR, NP  calcitRIOL  (ROCALTROL ) 0.25 MCG capsule Take 0.25 mcg by mouth 3 (three) times a week. Patient not taking: Reported on 09/23/2024 08/07/24   [provider]  Lancets [facility] ULTRASOFT) lancets Use as instructed to test blood sugar daily 02/15/24   Clark, Katherine K, NP    Scheduled Meds:  allopurinol   100 mg Oral Daily   aspirin   81 mg Oral Daily   budesonide  (PULMICORT ) nebulizer solution  0.25 mg Nebulization BID   calcitRIOL   0.25 mcg Oral Once per day on Monday Wednesday Friday   Chlorhexidine  Gluconate Cloth  6 each Topical Daily   dolutegravir   50 mg Oral Daily   emtricitabine -tenofovir  AF  1 tablet Oral Daily   feeding supplement  237 mL Oral BID BM   fludrocortisone   0.1 mg Oral Daily   gabapentin   100 mg Oral TID   gentamicin  cream  1 Application Topical Daily   heparin  injection (subcutaneous)  5,000 Units Subcutaneous Q8H   hydrocortisone  sod succinate (SOLU-CORTEF ) inj  50 mg Intravenous Q12H   insulin  aspart  0-9 Units Subcutaneous Q4H   levothyroxine   150 mcg Oral Q0600   lidocaine   1 patch Transdermal Q24H   loratadine   10 mg Oral QHS   midodrine   5 mg Oral TID WC   mirtazapine   7.5 mg Oral QHS   multivitamin  1 tablet Oral QHS   pantoprazole   40 mg Oral Daily   rosuvastatin   10 mg Oral Daily   Continuous Infusions:  dextrose  30 mL/hr at 09/25/24 1234   dialysis solution 1.5% low-MG/low-CA     dialysis solution 2.5% low-MG/low-CA     PRN Meds: busPIRone , cetaphil, docusate sodium , heparin  sodium (porcine) 3,000 Units in dialysis solution 1.5% low-MG/low-CA 6,000 mL dialysis solution, HYDROmorphone  (DILAUDID ) injection, ondansetron  (ZOFRAN ) IV, oxyCODONE , polyethylene glycol  Allergies:    Allergies  Allergen Reactions    Genvoya [Elviteg-Cobic-Emtricit-Tenofaf] Hives   Lisinopril Cough   Aldactone  [Spironolactone ] Hives   Valium  [Diazepam ] Other (See Comments)    Lethargy (pt states she cannot take this again)   Tegaderm Ag Mesh [Silver] Itching    Social History:   Social History   Socioeconomic History   Marital status: Single    Spouse name: Not on file   Number of children: 1   Years of education: Not on file   Highest education level: Not on file  Occupational History   Occupation: Investment Banker, Corporate  Tobacco Use   Smoking status: Former    Current packs/day: 0.00    Average packs/day: 0.5 packs/day for 15.0 years (7.5 ttl pk-yrs)    Types: Cigarettes    Start date: 04/12/2017    Quit date: 2019    Years since quitting: 6.9   Smokeless tobacco: Never  Vaping Use   Vaping status: Never Used  Substance and Sexual Activity   Alcohol  use: Not Currently    Comment: occasional wine   Drug use: No   Sexual activity: Not Currently    Birth control/protection: Post-menopausal  Other Topics Concern   Not on file  Social History Narrative   Right Handed    Lives in a two story home   Social Drivers of Health   Financial Resource Strain: Medium Risk (06/27/2024)   Overall Financial Resource Strain (CARDIA)    Difficulty of Paying Living Expenses: Somewhat hard  Food Insecurity: No Food Insecurity (09/16/2024)   Hunger Vital Sign    Worried About Running Out of Food in the Last Year: Never true    Ran Out of Food in the Last Year: Never true  Recent Concern: Food Insecurity - Food Insecurity Present (06/27/2024)   Hunger Vital Sign    Worried About Running Out of Food in the Last Year: Sometimes true    Ran Out of Food in the Last Year: Sometimes true  Transportation Needs: No Transportation Needs (09/16/2024)   PRAPARE - Administrator, Civil Service (Medical): No    Lack of Transportation (Non-Medical): No  Physical Activity: Inactive (06/27/2024)   Exercise Vital Sign     Days of Exercise per Week: 0 days    Minutes of Exercise per Session: 0 min  Stress: Stress Concern Present (06/27/2024)   [employer] of Occupational Health - Occupational Stress Questionnaire    Feeling of Stress: To some extent  Social Connections: Unknown (06/27/2024)   Social Connection and Isolation Panel    Frequency of Communication with Friends and Family: More than three times a week    Frequency of Social Gatherings with Friends and Family: Three times a week    Attends Religious Services: More than 4 times per year    Active Member of Clubs or Organizations: No    Attends Banker Meetings: Never    Marital Status: Not on file  Intimate Partner Violence: Not At Risk (09/16/2024)   Humiliation, Afraid, Rape, and Kick questionnaire    Fear of Current or Ex-Partner: No    Emotionally Abused: No    Physically Abused: No    Sexually Abused: No    Family History:    Family History  Problem Relation Age of Onset   Hypertension Mother    Cancer Mother        laryngeal   Heart disease Mother        stent   Pancreatic cancer Father  Hypertension Father    Lung cancer Father 62       hx smoking   Hypertension Sister    Hypertension Sister    Hypertension Brother    Breast cancer Maternal Aunt 59   Breast cancer Maternal Aunt        dx 60+   Cancer Maternal Uncle        Lung CA   Breast cancer Paternal Aunt 10   Breast cancer Paternal Aunt        dx 68's   Breast cancer Paternal Aunt        dx 50's   Prostate cancer Paternal Uncle    Prostate cancer Paternal Uncle    Lung cancer Paternal Uncle    Breast cancer Cousin 76   Breast cancer Cousin        dx <50   Breast cancer Cousin        dx <50   Breast cancer Cousin        dx <50   Heart disease Other    Hypertension Other    Stroke Other        Grandparent   Kidney disease Other        Grandparent   Diabetes Other        FH of Diabetes   Colon cancer Neg Hx    Esophageal cancer Neg Hx     Rectal cancer Neg Hx    Stomach cancer Neg Hx      ROS:  Please see the history of present illness.   All other ROS reviewed and negative.     Physical Exam/Data: Vitals:   09/25/24 1000 09/25/24 1100 09/25/24 1129 09/25/24 1200  BP: 131/64 (!) 87/57  124/65  Pulse: 73 70  73  Resp: 13 11  12   Temp:   97.9 F (36.6 C)   TempSrc:   Oral   SpO2: 96% 95%  98%  Weight:        Intake/Output Summary (Last 24 hours) at 09/25/2024 1547 Last data filed at 09/25/2024 0801 Gross per 24 hour  Intake 750.32 ml  Output --  Net 750.32 ml      09/25/2024    5:44 AM 09/24/2024    5:00 AM 09/23/2024    5:30 PM  Last 3 Weights  Weight (lbs) 188 lb 11.4 oz 176 lb 9.4 oz 168 lb 14 oz  Weight (kg) 85.6 kg 80.1 kg 76.6 kg     Body mass index is 31.4 kg/m.  General:  Well nourished, well developed, in no acute distress. alert and orientated on room air HEENT: normal Neck: no JVD Vascular: No carotid bruits; Distal pulses 2+ bilaterally Cardiac:  normal S1, S2; RRR; no murmur  Lungs:  clear to auscultation bilaterally, no wheezing, rhonchi or rales  Abd: soft, nontender, no hepatomegaly  Ext: no edema Musculoskeletal:  No deformities Skin: warm and dry  Neuro:  no focal abnormalities noted Psych:  Normal affect   EKG:  The EKG was personally reviewed and demonstrates:  sinus rhythm with a rate of 69, left bundle branch block, and a PAC.  The patient has had a left bundle branch block on prior EKGs. Telemetry:  Telemetry was personally reviewed and demonstrates: Normal sinus rhythm with heart rates in the 60s to 80s  Relevant CV Studies:  Echocardiogram IMPRESSIONS     1. Left ventricular ejection fraction, by estimation, is 30 to 35%. The  left ventricle has moderately decreased function. The left ventricle  demonstrates regional wall motion abnormalities (see scoring  diagram/findings for description). There is mild  concentric left ventricular hypertrophy. Left ventricular  diastolic  parameters are consistent with Grade II diastolic dysfunction  (pseudonormalization). Elevated left ventricular end-diastolic pressure.   2. Right ventricular systolic function is normal. The right ventricular  size is normal.   3. Left atrial size was severely dilated.   4. The mitral valve is normal in structure. Moderate to severe mitral  valve regurgitation. No evidence of mitral stenosis.   5. The aortic valve is normal in structure. Aortic valve regurgitation is  not visualized. No aortic stenosis is present.   6. The inferior vena cava is normal in size with greater than 50%  respiratory variability, suggesting right atrial pressure of 3 mmHg.   Laboratory Data: High Sensitivity Troponin:   Recent Labs  Lab 09/23/24 1431 09/23/24 1727 09/23/24 2000 09/23/24 2200  TROPONINIHS 338* 392* 331* 341*     Chemistry Recent Labs  Lab 09/23/24 1431 09/24/24 0354 09/24/24 1731 09/25/24 0537  NA 141 135  --  130*  K 2.8* 2.9* 3.7 3.4*  CL 98 98  --  93*  CO2 25 17*  --  20*  GLUCOSE 101* 136*  --  117*  BUN 39* 35*  --  33*  CREATININE 14.09* 11.85*  --  11.08*  CALCIUM  8.1* 8.3*  --  8.4*  MG  --  1.4* 2.7* 2.6*  GFRNONAA 3* 3*  --  4*  ANIONGAP 18* 20*  --  17*    Recent Labs  Lab 09/23/24 1431  PROT 6.6  ALBUMIN  1.9*  AST 19  ALT 9  ALKPHOS 244*  BILITOT 1.0   Lipids No results for input(s): CHOL, TRIG, HDL, LABVLDL, LDLCALC, CHOLHDL in the last 168 hours.  Hematology Recent Labs  Lab 09/23/24 1431 09/24/24 0354 09/25/24 0537  WBC 7.8 10.8* 11.7*  RBC 3.32* 2.52* 2.53*  HGB 11.8* 9.2* 9.1*  HCT 36.9 28.0* 27.5*  MCV 111.1* 111.1* 108.7*  MCH 35.5* 36.5* 36.0*  MCHC 32.0 32.9 33.1  RDW 15.0 15.1 14.8  PLT 214 186 180   Thyroid   Recent Labs  Lab 09/23/24 1839  TSH 1.520    BNP Recent Labs  Lab 09/24/24 0927  BNP 1,614.8*    DDimer No results for input(s): DDIMER in the last 168 hours.  Radiology/Studies:  US   Abdomen Limited RUQ (LIVER/GB) Result Date: 09/24/2024 EXAM: Right Upper Quadrant Abdominal Ultrasound 09/24/2024 03:50:43 PM TECHNIQUE: Real-time ultrasonography of the right upper quadrant of the abdomen was performed. COMPARISON: None available. CLINICAL HISTORY: Abdominal pain FINDINGS: LIVER: The liver demonstrates normal echogenicity. No intrahepatic biliary ductal dilatation. No evidence of mass. BILIARY SYSTEM: No pericholecystic fluid. Cholesterolosis of the gallbladder wall, suggesting Adenomyomatosis. Layering 16 mm gallstone. Common bile duct is within normal limits measuring 3 mm. OTHER: No right upper quadrant ascites. IMPRESSION: 1. Cholelithiasis without sonographic findings to suggest acute cholecystitis. 2. Gallbladder adenomyomatosis, benign. Electronically signed by: Pinkie Pebbles MD 09/24/2024 07:22 PM EST RP Workstation: [id number]   VAS US  LOWER EXTREMITY VENOUS (DVT) Result Date: 09/24/2024  Lower Venous DVT Study Patient Name:  [name] [facility]  Date of Exam:   09/24/2024 Medical Rec #: [id number]              Accession #:    [id number] Date of Birth: 15-Nov-1965              Patient Gender: F Patient Age:   43 years Exam  Location:  [facility] Procedure:      VAS US  LOWER EXTREMITY VENOUS (DVT) Referring Phys: TORIBIO SHARPS --------------------------------------------------------------------------------  Indications: Edema. Other Indications: Anemia, H/O thyroid  cancer. Comparison Study: No prior exam. Performing Technologist: Edilia Elden Appl  Examination Guidelines: A complete evaluation includes B-mode imaging, spectral Doppler, color Doppler, and power Doppler as needed of all accessible portions of each vessel. Bilateral testing is considered an integral part of a complete examination. Limited examinations for reoccurring indications may be performed as noted. The reflux portion of the exam is performed with the patient in reverse Trendelenburg.   +---------+---------------+---------+-----------+----------+--------------+ RIGHT    CompressibilityPhasicitySpontaneityPropertiesThrombus Aging +---------+---------------+---------+-----------+----------+--------------+ CFV      Full           Yes      Yes                                 +---------+---------------+---------+-----------+----------+--------------+ SFJ      Full           Yes      Yes                                 +---------+---------------+---------+-----------+----------+--------------+ FV Prox  Full                                                        +---------+---------------+---------+-----------+----------+--------------+ FV Mid   Full                                                        +---------+---------------+---------+-----------+----------+--------------+ FV DistalFull                                                        +---------+---------------+---------+-----------+----------+--------------+ PFV      Full                                                        +---------+---------------+---------+-----------+----------+--------------+ POP      Full           Yes      Yes                                 +---------+---------------+---------+-----------+----------+--------------+ PTV      Full                                                        +---------+---------------+---------+-----------+----------+--------------+ PERO     Full                                                        +---------+---------------+---------+-----------+----------+--------------+   +---------+---------------+---------+-----------+----------+--------------+  LEFT     CompressibilityPhasicitySpontaneityPropertiesThrombus Aging +---------+---------------+---------+-----------+----------+--------------+ CFV      Full           Yes      Yes                                  +---------+---------------+---------+-----------+----------+--------------+ SFJ      Full           Yes      Yes                                 +---------+---------------+---------+-----------+----------+--------------+ FV Prox  Full                                                        +---------+---------------+---------+-----------+----------+--------------+ FV Mid   Full                                                        +---------+---------------+---------+-----------+----------+--------------+ FV DistalFull                                                        +---------+---------------+---------+-----------+----------+--------------+ PFV      Full                                                        +---------+---------------+---------+-----------+----------+--------------+ POP      Full           Yes      Yes                                 +---------+---------------+---------+-----------+----------+--------------+ PTV      Full                                                        +---------+---------------+---------+-----------+----------+--------------+ PERO     Full                                                        +---------+---------------+---------+-----------+----------+--------------+     Summary: BILATERAL: - No evidence of deep vein thrombosis seen in the lower extremities, bilaterally. -No evidence of popliteal cyst, bilaterally.   *See table(s) above for measurements and observations. Electronically signed by Norman Serve on 09/24/2024 at 3:43:33 PM.    Final    ECHOCARDIOGRAM COMPLETE  Result Date: 09/24/2024    ECHOCARDIOGRAM REPORT   Patient Name:   [name] [facility] Date of Exam: 09/24/2024 Medical Rec #:  [id number]             Height:       65.0 in Accession #:    [id number]            Weight:       176.6 lb Date of Birth:  Nov 24, 1965             BSA:          1.876 m Patient Age:    58 years              BP:            128/58 mmHg Patient Gender: F                     HR:           68 bpm. Exam Location:  Inpatient Procedure: 2D Echo, Cardiac Doppler, Color Doppler and Intracardiac            Opacification Agent (Both Spectral and Color Flow Doppler were            utilized during procedure). Indications:    Shock  History:        Patient has prior history of Echocardiogram examinations, most                 recent 01/30/2023. Risk Factors:Hypertension, Diabetes, Former                 Smoker and Dyslipidemia.  Sonographer:    Juliene Rucks Referring Phys: [id number] TORIBIO JAYSON SHARPS  Sonographer Comments: Patient is obese. IMPRESSIONS  1. Left ventricular ejection fraction, by estimation, is 30 to 35%. The left ventricle has moderately decreased function. The left ventricle demonstrates regional wall motion abnormalities (see scoring diagram/findings for description). There is mild concentric left ventricular hypertrophy. Left ventricular diastolic parameters are consistent with Grade II diastolic dysfunction (pseudonormalization). Elevated left ventricular end-diastolic pressure.  2. Right ventricular systolic function is normal. The right ventricular size is normal.  3. Left atrial size was severely dilated.  4. The mitral valve is normal in structure. Moderate to severe mitral valve regurgitation. No evidence of mitral stenosis.  5. The aortic valve is normal in structure. Aortic valve regurgitation is not visualized. No aortic stenosis is present.  6. The inferior vena cava is normal in size with greater than 50% respiratory variability, suggesting right atrial pressure of 3 mmHg. FINDINGS  Left Ventricle: Left ventricular ejection fraction, by estimation, is 30 to 35%. The left ventricle has moderately decreased function. The left ventricle demonstrates regional wall motion abnormalities. Definity  contrast agent was given IV to delineate the left ventricular endocardial borders. The left ventricular internal cavity size was  normal in size. There is mild concentric left ventricular hypertrophy. Left ventricular diastolic parameters are consistent with Grade II diastolic dysfunction (pseudonormalization). Elevated left ventricular end-diastolic pressure.  LV Wall Scoring: The entire apex is akinetic. The anterior wall, antero-lateral wall, anterior septum, inferior wall, posterior wall, mid inferoseptal segment, and basal inferoseptal segment are hypokinetic. Right Ventricle: The right ventricular size is normal. No increase in right ventricular wall thickness. Right ventricular systolic function is normal. Left Atrium: Left atrial size was severely dilated. Right Atrium: Right atrial size was normal in size. Pericardium: Trivial pericardial effusion is present. The pericardial effusion is circumferential. Mitral Valve:  The mitral valve is normal in structure. Moderate to severe mitral valve regurgitation. No evidence of mitral valve stenosis. Tricuspid Valve: The tricuspid valve is normal in structure. Tricuspid valve regurgitation is not demonstrated. No evidence of tricuspid stenosis. Aortic Valve: The aortic valve is normal in structure. Aortic valve regurgitation is not visualized. No aortic stenosis is present. Pulmonic Valve: The pulmonic valve was normal in structure. Pulmonic valve regurgitation is not visualized. No evidence of pulmonic stenosis. Aorta: The aortic root is normal in size and structure. Venous: The inferior vena cava is normal in size with greater than 50% respiratory variability, suggesting right atrial pressure of 3 mmHg. IAS/Shunts: No atrial level shunt detected by color flow Doppler.  LEFT VENTRICLE PLAX 2D LVIDd:         5.20 cm      Diastology LVIDs:         3.80 cm      LV e' medial:    4.90 cm/s LV PW:         1.10 cm      LV E/e' medial:  22.7 LV IVS:        1.00 cm      LV e' lateral:   7.72 cm/s LVOT diam:     2.10 cm      LV E/e' lateral: 14.4 LV SV:         76 LV SV Index:   40 LVOT Area:     3.46  cm  LV Volumes (MOD) LV vol d, MOD A2C: 222.0 ml LV vol d, MOD A4C: 268.0 ml LV vol s, MOD A2C: 158.0 ml LV vol s, MOD A4C: 197.0 ml LV SV MOD A2C:     64.0 ml LV SV MOD A4C:     268.0 ml LV SV MOD BP:      68.3 ml RIGHT VENTRICLE             IVC RV Basal diam:  3.60 cm     IVC diam: 1.80 cm RV Mid diam:    2.50 cm RV S prime:     11.00 cm/s TAPSE (M-mode): 1.7 cm LEFT ATRIUM             Index        RIGHT ATRIUM           Index LA diam:        4.10 cm 2.19 cm/m   RA Area:     13.30 cm LA Vol (A2C):   78.1 ml 41.63 ml/m  RA Volume:   29.40 ml  15.67 ml/m LA Vol (A4C):   95.1 ml 50.69 ml/m LA Biplane Vol: 91.2 ml 48.61 ml/m  AORTIC VALVE LVOT Vmax:   107.00 cm/s LVOT Vmean:  69.800 cm/s LVOT VTI:    0.219 m  AORTA Ao Root diam: 2.90 cm MITRAL VALVE                TRICUSPID VALVE MV Area (PHT): 3.77 cm     TR Peak grad:   13.7 mmHg MV Decel Time: 201 msec     TR Vmax:        185.00 cm/s MR Peak grad: 115.8 mmHg MR Vmax:      538.00 cm/s   SHUNTS MV E velocity: 111.00 cm/s  Systemic VTI:  0.22 m MV A velocity: 101.00 cm/s  Systemic Diam: 2.10 cm MV E/A ratio:  1.10 Kardie Tobb DO Electronically signed by Dub Huntsman DO Signature Date/Time: 09/24/2024/1:41:17 PM  Final    MR LUMBAR SPINE WO CONTRAST Result Date: 09/23/2024 EXAM: MRI LUMBAR SPINE 09/23/2024 11:15:00 PM TECHNIQUE: Multiplanar multisequence MRI of the lumbar spine was performed without the administration of intravenous contrast. COMPARISON: MRI lumbar spine 06/06/2024 CLINICAL HISTORY: Lumbar radiculopathy, symptoms persist with > 6 wks treatment FINDINGS: BONES AND ALIGNMENT: Normal alignment. Normal vertebral body heights. 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 Bone marrow signal is unremarkable. SPINAL CORD: The conus terminates normally. SOFT TISSUES: Aortic aneurysm better characterized on CT chest from today. L1-L2: No significant disc herniation. No spinal canal stenosis or neural foraminal narrowing. L2-L3: No  significant disc herniation. No spinal canal stenosis or neural foraminal narrowing. L3-L4: No significant disc herniation. No spinal canal stenosis or neural foraminal narrowing. L4-L5: No significant disc herniation. Facet arthropathy. No spinal canal stenosis or neural foraminal narrowing. L5-S1: No significant disc herniation. Facet arthropathy. No spinal canal stenosis or neural foraminal narrowing. IMPRESSION: 1. No spinal canal stenosis or neural foraminal narrowing in the lumbar spine. 2. 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 Electronically signed by: Gilmore Molt MD 09/23/2024 11:46 PM EST RP Workstation: [id number]   MR THORACIC SPINE WO CONTRAST Result Date: 09/23/2024 EXAM: MRI Thoracic Spine Without Intravenous Contrast 09/23/2024 11:14:00 PM TECHNIQUE: Multiplanar multisequence MRI of the thoracic spine was performed without the administration of intravenous contrast. COMPARISON: None available. CLINICAL HISTORY: Myelopathy, chronic, thoracic spine FINDINGS: BONES AND ALIGNMENT: Normal alignment. Normal vertebral body heights. Bone marrow signal is unremarkable. No abnormal enhancement. SPINAL CORD: Normal spinal cord volume. Normal spinal cord signal. SOFT TISSUES: Aortic aneurysm characterized on same day CT chest. DEGENERATIVE CHANGES: No significant disc herniation. No spinal canal stenosis or neural foraminal narrowing. IMPRESSION: 1. No spinal canal stenosis or neural foraminal narrowing in the thoracic spine. 2. Aortic aneurysm characterized on same day CT chest. Electronically signed by: Gilmore Molt MD 09/23/2024 11:43 PM EST RP Workstation: [id number]   CT Angio Chest/Abd/Pel for Dissection W and/or Wo Contrast Result Date: 09/23/2024 CLINICAL DATA:  Acute aortic syndrome suspected, hypotension, altered level of consciousness EXAM: CT ANGIOGRAPHY CHEST, ABDOMEN AND PELVIS TECHNIQUE: Non-contrast CT of the chest was initially obtained. Multidetector CT  imaging through the chest, abdomen and pelvis was performed using the standard protocol during bolus administration of intravenous contrast. Multiplanar reconstructed images and MIPs were obtained and reviewed to evaluate the vascular anatomy. RADIATION DOSE REDUCTION: This exam was performed according to the departmental dose-optimization program which includes automated exposure control, adjustment of the mA and/or kV according to patient size and/or use of iterative reconstruction technique. 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 The ascending thoracic aorta measures up to 3.0 cm, mid aortic arch measures up to 4.4 cm, and descending thoracic aorta measures up to 5.0 cm in maximal dimension, similar to prior study. The graft extends to the level of the diaphragmatic hiatus, with no evidence of endoleak involving the thoracic aorta. The common trunk of the great vessels off the anterior margin of the ascending thoracic aorta is again widely patent, and the visualized portions of the great vessels are patent. The heart is unremarkable without pericardial effusion. Timing of contrast bolus limits evaluation of the pulmonary vasculature. Mediastinum/Nodes: Prior thyroidectomy. Trachea and esophagus appear unremarkable. Lungs/Pleura: Stable areas of scarring and subsegmental atelectasis, primarily at the lung bases. No acute airspace disease, effusion, or pneumothorax.  Musculoskeletal: No acute or destructive bony abnormalities. Reconstructed images demonstrate no additional findings. Review of the MIP images confirms the above findings. CTA ABDOMEN AND PELVIS FINDINGS VASCULAR Aorta: The endoluminal stent graft terminates at the level of the diaphragmatic hiatus. Fusiform abdominal aortic aneurysm  is seen throughout the entirety of the abdominal aorta, measuring up to 4.3 cm at the level of the celiac axis, similar to prior study. Significant thrombus within the false lumen of the known abdominal aortic dissection is again seen, with perfusion likely due to small fenestrations as well as vascular supply from small lumbar arteries. This is similar to prior exam allowing for differences in the timing of the contrast bolus. No evidence of aortic leak or rupture. Celiac: Patent without evidence of aneurysm, dissection, vasculitis or significant stenosis. Vessel arises from the true lumen. SMA: Patent without evidence of aneurysm, dissection, vasculitis or significant stenosis. Vessel arises from the true lumen. Renals: There is diffuse atherosclerosis of the bilateral renal arteries, with stable moderate stenosis of the proximal renal arteries bilaterally estimated at 50-70%. No aneurysm, dissection, or vasculitis. IMA: Patent without evidence of aneurysm, dissection, vasculitis or significant stenosis. Arises from the false lumen. Inflow: The aortic dissection extends into the left common iliac and external iliac arteries, terminating at the left common femoral artery. This is similar to previous exam. The thrombosed false lumen of the left external iliac artery results in significant stenosis, estimated 70-90%. The right common iliac, external iliac, and internal iliac vessels are widely patent. Veins: No obvious venous abnormality within the limitations of this arterial phase study. Review of the MIP images confirms the above findings. NON-VASCULAR Hepatobiliary: No focal liver abnormality is seen. No gallstones, gallbladder wall thickening, or biliary dilatation. Pancreas: Unremarkable. No pancreatic ductal dilatation or surrounding inflammatory changes. Spleen: Normal in size without focal abnormality. Adrenals/Urinary Tract: Stable thickening the adrenal glands. Bilateral renal cortical atrophy.  Nonobstructing 8 mm calculus lower pole right kidney. Remaining calcifications at the renal hila are felt to be vascular. No obstructive uropathy. Bladder is decompressed. Stomach/Bowel: No bowel obstruction or ileus. Normal appendix right lower quadrant. No bowel wall thickening or inflammatory change. Lymphatic: No pathologic adenopathy. Reproductive: Uterus and bilateral adnexa are unremarkable. Other: Small amount of pelvic free fluid is well as trace pneumoperitoneum within the right upper quadrant are likely secondary to indwelling peritoneal dialysis catheter, which is coiled in the right hemipelvis. No abdominal wall hernia. Musculoskeletal: No acute or destructive bony abnormalities. Reconstructed images demonstrate no additional findings. Review of the MIP images confirms the above findings. IMPRESSION: Vascular: 1. Stable thoracoabdominal aortic aneurysm and dissection, with no significant change in the endoluminal stent graft seen within the thoracic aorta on prior study. 2. No evidence of aortic leak or rupture. 3. Stable stenoses within the bilateral renal arteries and left external iliac artery. 4.  Aortic Atherosclerosis (ICD10-I70.0). Nonvascular: 1. Persistent pneumoperitoneum and pelvic free fluid, likely due to indwelling peritoneal dialysis catheter which is coiled in the right hemipelvis. 2. Nonobstructing right renal calculus. Electronically Signed   By: Ozell Daring M.D.   On: 09/23/2024 15:13   DG Chest Port 1 View Result Date: 09/23/2024 CLINICAL DATA:  Questionable sepsis.  Evaluate for an abnormality. EXAM: PORTABLE CHEST 1 VIEW COMPARISON:  09/07/2024 FINDINGS: Again noted is a thoracic aortic stent graft. Heart size is stable and within normal limits. Median sternotomy wires. Lungs are clear without focal airspace disease or pulmonary edema. IMPRESSION: No active disease. Electronically Signed   By: Juliene  Philip M.D.   On: 09/23/2024 14:29     Assessment and Plan: [name] is a 58 y.o. female with a hx of with a history of diabetes, coronary artery calcifications on prior CTs, ESRD on peritoneal dialysis, prior aortic dissection, upper extremity DVT, mild OSA, HIV, hypertension, Graves' disease, papillary thyroid  cancer s/p thyroidectomy, and right breast cancer who is being seen 09/25/2024 for the evaluation of abnormal echocardiogram at the request of Verdon Gore MD.  Adrenal apoplexy Suspected adrenal insufficiency Shock resolving The patient was hospitalized for concerns of hypotension and altered mental status. CTA showed no significant change in endoluminal graft and aortic atherosclerosis. There are concerns for adrenal insufficiency as the patient had a recent pituitary hemorrhage.  Hypotension improved when the patient was started on IV hydrocortisone .  Cardiomyopathy Moderate to severe MR The patient underwent a left and right cardiac catheterization on 02/2020 that showed normal coronary arteries and mild pulmonary hypertension (PA = 41/12 (26) )  Most recent echo prior to admission was done on 01/2023 and showed a normal LVEF of 55 to 60%, moderate concentric LVH, G2 DD, mild to moderate MR, aortic valve calcification without any evidence of stenosis. Echocardiogram on 09/24/2024 showed a decreased LVEF of 30 to 35%, with apical akinesis, G2 DD, normal RV systolic function, severely dilated left atrium, moderate to severe MR, normal aortic valve function, and IVC with greater than 50% respiratory variability.  Volume management per nephrology. GDMT is limited by hypotension on IV hydrocortisone , midodrine  and end-stage renal disease on peritoneal dialysis.   NSTEMI Patient denies any chest pain but does report to 2 hours of worsening shortness of breath.  In the setting of shock as mentioned above. EKG showed sinus rhythm with a rate of 69, left bundle branch block, and a PAC.  The patient has had a left bundle branch block on prior  EKGs. Elevated troponins 338, 392, 331, 341 Plan to do a left and right heart cath Order LDL and LPA. Continue Crestor  10 mg daily Continue aspirin  81 mg daily   Left bundle branch block EKG showed sinus rhythm with a rate of 69, left bundle branch block, and a PAC.  The patient has had a left bundle branch block on prior EKGs. May consider CRT but I suspect patient is likely a poor candidate with end-stage renal disease on peritoneal dialysis.  Most recent BP was 124/65.   HIV Management per ID and primary.   ESRD on peritoneal dialysis Electrolyte abnormalities Management per nephrology   Prior aortic artery dissection On 01/2023 the patient was hospitalized for an acute type B aortic dissection. Vascular surgery did a thoracic endovascular aortic repair. On 08/2023 the patient had posterior a median sternotomy with aortic arch branching with bypasses to the innominate, left carotid, and left subclavian arteries.  Management per vascular surgery.   Otherwise management per primary    Risk Assessment/Risk Scores:  TIMI Risk Score for Unstable Angina or Non-ST Elevation MI:   The patient's TIMI risk score is 3, which indicates a 13% risk of all cause mortality, new or recurrent myocardial infarction or need for urgent revascularization in the next 14 days.         For questions or updates, please contact [facility] Please consult [email or web address] for contact info under     Signed, Morse Clause, PA-C  09/25/2024 3:47 PM  Patient seen and examined, note reviewed with the signed Advanced Practice Provider. I personally reviewed laboratory data, imaging studies  and relevant notes. I independently examined the patient and formulated the important aspects of the plan. I have personally discussed the plan with the patient and/or family. Comments or changes to the note/plan are indicated below.  HPI: This is a 58 year old female with past medical history of diabetes,  coronary calcification on prior CT, ESRD on peritoneal dialysis, aortic dissection s/p repair, upper extremity DVT, OSA, HIV, hypertension, Graves' disease, breast cancer previously on chemotherapy (carboplatin , docetaxel , trastuzumab  and Pertuzumab  changed to trastuzumab  due to diarrhea), left bundle branch block who presented to the ED with worsening shortness of breath and malaise.  Cardiology was consulted for abnormal echocardiogram with newly reduced ejection fraction of 30 to 35%, wall motion abnormalities and moderate to severe MR.  She has not had any chest pain.  My Exam:  Physical Exam Vitals and nursing note reviewed.  Constitutional:      Appearance: Normal appearance.     Comments: Chronically ill-appearing  HENT:     Head: Normocephalic and atraumatic.  Eyes:     Conjunctiva/sclera: Conjunctivae normal.  Cardiovascular:     Rate and Rhythm: Normal rate and regular rhythm.     Heart sounds: Murmur heard.  Pulmonary:     Effort: Pulmonary effort is normal.     Breath sounds: Normal breath sounds.  Musculoskeletal:        General: No swelling or tenderness.  Skin:    Coloration: Skin is not jaundiced or pale.  Neurological:     Mental Status: She is alert.      Telemetry: Normal sinus rhythm- Personally reviewed EKG: Normal sinus rhythm with 1 PVC and left bundle branch block (QRS 170 ms).- Personally reviewed Echo: Echo reports ejection fraction of 30 to 35%, grade 2 diastolic dysfunction and an akinetic apex with hypokinesis of the remaining segments.  On personal review it does appear that the anterior septum is more hypokinetic/akinetic compared to the remaining segments however she has a left bundle branch block at baseline and therefore this wall motion abnormality is difficult to assess- Personally reviewed   Assessment & Plan:  New HFrEF-echo shows ejection fraction of 30 to 35% with an akinetic apex and hypokinesis of remaining segments.  My personal review the  anterior septum appears more hypokinetic than the remaining segments however the baseline LBBB makes this difficult to assess.  When compared to prior echoes in the setting of LBBB she did not have such significant wall motion abnormalities in the septal area.  Personal review of her CTA chest abdomen pelvis on 09/23/2024 does not show any significant findings for LAD calcifications by personal read. Unable to start GDMT due to tenuous BP.  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  Has LBBB with widened QRS but will need to be on medically optimized GDMT prior to CRT placement.  Volume management per nephrology LBBB Moderate to severe MR Circulatory shock - discussed with Dr. 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 The risks [stroke (1 in 1000), death (1 in 1000), kidney failure [usually temporary] (1 in 500), bleeding (1 in 200), allergic reaction [possibly serious] (1 in 200)], benefits (diagnostic support and management of coronary artery disease) and alternatives of a cardiac catheterization were discussed in  detail with [name] and she is willing to proceed.      Bonney Emeline Calender, DO [facility]  09/25/2024 4:19 PM

## 2024-09-25 NOTE — TOC CM/SW Note (Signed)
 Transition of Care [facility]) - Inpatient Brief Assessment   Patient Details  Name: [name] MRN: [id number] Date of Birth: 1965/12/26  Transition of Care [facility]) CM/SW Contact:    Tom-Johnson, Harvest Muskrat, RN Phone Number: 09/25/2024, 3:39 PM   Clinical Narrative:  Patient presented to the ED from her outpatient Dialysis Clinic with Back Pain, Abdominal pain and Dyspnea. Noted to be Hypotensive in the ED with BP of 64/30. Admitted with Shock.CIR following for potential admit.   TOC will continue to follow as patient progresses with care towards discharge.      Transition of Care Asessment:

## 2024-09-25 NOTE — Progress Notes (Signed)
 PD resumed Pd treatment ,first cycle of filling is completed.

## 2024-09-25 NOTE — TOC Initial Note (Signed)
 Transition of Care [facility]) - Initial/Assessment Note    Patient Details  Name: [name] MRN: [id number] Date of Birth: 1966-03-01  Transition of Care [facility]) CM/SW Contact:    Lendia Dais, LCSWA Phone Number: 09/25/2024, 4:28 PM  Clinical Narrative: Pt is from home alone, indep w/ ADLs, and has not drove in the last month. Pt states that her sister Share and her father provide transportation. CSW received permission to contact Share with any updates.  Pt reports no use of DME, has seen PCP in the last year, and takes meds as prescribed.  Pt's source of income is disability and stated that they are able to afford all their basic needs.  Pt reports no MH/SU needs.  TOC will continue to monitor.                  Expected Discharge Plan: Home/Self Care     Patient Goals and CMS Choice Patient states their goals for this hospitalization and ongoing recovery are:: Pt stated they had no goals   Choice offered to / list presented to : NA      Expected Discharge Plan and Services In-house Referral: Clinical Social Work                                            Prior Living Arrangements/Services   Lives with:: Self Patient language and need for interpreter reviewed:: Yes Do you feel safe going back to the place where you live?: Yes      Need for Family Participation in Patient Care: No (Comment) Care giver support system in place?: Yes (comment)   Criminal Activity/Legal Involvement Pertinent to Current Situation/Hospitalization: No - Comment as needed  Activities of Daily Living      Permission Sought/Granted Permission sought to share information with : Family Supports Permission granted to share information with : Yes, Verbal Permission Granted  Share Information with NAME: Share Jonette     Permission granted to share info w Relationship: Advertising Account Planner granted to share info w Contact Information: [phone]  Emotional  Assessment Appearance:: Appears stated age Attitude/Demeanor/Rapport: Lethargic Affect (typically observed): Calm Orientation: : Oriented to Self, Oriented to Place, Oriented to  Time, Oriented to Situation Alcohol  / Substance Use: Not Applicable Psych Involvement: No (comment)  Admission diagnosis:  Acute abdominal pain [R10.9] Shock (HCC) [R57.9] Shock circulatory (HCC) [R57.9] ESRD (end stage renal disease) on dialysis (HCC) [N18.6, Z99.2] Patient Active Problem List   Diagnosis Date Noted   Malnutrition of moderate degree 09/25/2024   Shock (HCC) 09/23/2024   Decreased appetite 09/20/2024   Clostridioides difficile infection 09/20/2024   Gastritis and gastroduodenitis 09/11/2024   Abnormal esophagram 09/10/2024   Abdominal pain, chronic, epigastric 09/10/2024   Peritonitis (HCC) 09/08/2024   Diarrhea 09/08/2024   Migraine 07/31/2024   Leukocytosis 07/31/2024   Benign neoplasm of pituitary gland (HCC) 07/30/2024   Hallucinations 06/03/2024   Acute left-sided low back pain 03/29/2024   Localized skin mass, lump, or swelling 03/29/2024   Vaginal itching 01/18/2024   H/O aortic aneurysm repair 08/25/2023   Thoracic back pain 03/07/2023   At risk for sleep apnea 03/07/2023   Hypothyroidism 02/13/2023   History of aortic dissection 01/29/2023   Chronic pain of both knees 07/29/2022   Chronic illness 10/27/2021   Paresthesias 06/30/2021   Chronic pain of right hand 06/30/2021   Chronic  low back pain 06/30/2021   Voice hoarseness 12/15/2020   Postoperative breast asymmetry 02/28/2020   Chronic gout 01/27/2020   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   Esophageal dysphagia 07/23/2019   ESRD (end stage renal disease) (HCC) 07/12/2019   Normocytic anemia 07/12/2019   Preventative health care 09/10/2018   Port-A-Cath in place 07/12/2018   Family history of breast cancer    Family history of prostate cancer    Family history of lung cancer    Malignant neoplasm  of lower-inner quadrant of right breast of female, estrogen receptor positive (HCC) 05/10/2018   Hyperlipidemia 04/26/2017   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 [place] [address] Phone: [phone] Fax: [phone]  [id number] [place],  - [address] AT [facility] [address] [place] [address] Phone: [phone] Fax: [phone]  [name] [address] [place] [address] Phone: [phone] Fax: [phone]     Social Drivers of Health (SDOH) Social History: SDOH Screenings   Food Insecurity: No Food Insecurity (09/16/2024)  Recent Concern: Food Insecurity - Food Insecurity Present (06/27/2024)  Housing: Unknown (09/16/2024)  Recent Concern: Housing - High Risk (06/27/2024)  Transportation Needs: No Transportation Needs (09/16/2024)  Utilities: Not At Risk (09/16/2024)  Recent Concern: Utilities - At Risk (06/27/2024)  Alcohol  Screen: Low Risk  (06/27/2024)  Depression (PHQ2-9): Low Risk  (09/20/2024)  Financial Resource Strain: Medium Risk (06/27/2024)  Physical Activity: Inactive (06/27/2024)  Social Connections: Unknown (06/27/2024)  Stress: Stress Concern Present (06/27/2024)  Tobacco Use: Medium Risk (09/23/2024)  Health Literacy: Adequate Health Literacy (06/27/2024)   SDOH  Interventions: Transportation Interventions: Inpatient TOC   Readmission Risk Interventions    09/12/2024    9:43 AM 02/21/2023    4:05 PM  Readmission Risk Prevention Plan  Transportation Screening Complete Complete  PCP or Specialist Appt within 3-5 Days Complete   HRI or Home Care Consult Complete   Social Work Consult for Recovery Care Planning/Counseling Complete   Palliative Care Screening Not Applicable   Medication Review Oceanographer) Referral to Pharmacy Complete  PCP or Specialist appointment within 3-5 days of discharge  Complete  HRI or Home Care Consult  Complete  SW Recovery Care/Counseling Consult  Complete  Palliative Care Screening  Not Applicable  Skilled Nursing Facility  Not Applicable

## 2024-09-25 NOTE — Progress Notes (Signed)
 Physical Therapy Treatment Patient Details Name: [name] MRN: [id number] DOB: 06-04-1966 Today's Date: 09/25/2024   History of Present Illness 58 y.o. female presenting 12/8 from dialysis for back pain, abdominal pain, and dyspnea. BP found to be 64/30; admitted to ICU for shock. 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 PMH: ESRD on PD, anemia, anxiety, asthma, T2DM, aortic dissection s/p endovascular repair, GERD, Grave's disease, HTN, HLD, papillary thyroid  cancer s/p thyroidectomy (2018) PVD, falls, sarcoidosis, suppurative hydradenitis    PT Comments  Patient progressing with mobility and able to tolerate 3 bouts of ambulation despite feeling tremulous.  She did not buckle and VSS except one episode desaturation to mid 80's improved with seated rest and cues for pursed lip breathing on RA.  Patient appropriate for intensive inpatient rehab prior to d/c home.     If plan is discharge home, recommend the following: A little help with walking and/or transfers;Assist for transportation;Help with stairs or ramp for entrance;Assistance with cooking/housework   Can travel by private vehicle        Equipment Recommendations  None recommended by PT    Recommendations for Other Services       Precautions / Restrictions Precautions Precautions: Fall Recall of Precautions/Restrictions: Intact Precaution/Restrictions Comments: PD catheter     Mobility  Bed Mobility Overal bed mobility: Needs Assistance Bed Mobility: Supine to Sit, Sit to Supine     Supine to sit: Supervision Sit to supine: Supervision   General bed mobility comments: assist for lines    Transfers Overall transfer level: Needs assistance Equipment used: Rolling walker (2 wheels) Transfers: Sit to/from Stand Sit to Stand: Min assist, +2 physical assistance           General transfer comment: Min A for balance  stabilization. 2 person assist for safety with lines/leads.    Ambulation/Gait Ambulation/Gait assistance: Min assist, +2 safety/equipment Gait Distance (Feet): 35 Feet (&12', 20') Assistive device: Rolling walker (2 wheels) Gait Pattern/deviations: Step-through pattern, Decreased stride length, Wide base of support, Shuffle       General Gait Details: tremors noted and pt reported feeling shaky, seated rest x 2, though VSS except one episode desaturation, improved with pursed lip breathing on RA   Stairs             Wheelchair Mobility     Tilt Bed    Modified Rankin (Stroke Patients Only)       Balance Overall balance assessment: Needs assistance   Sitting balance-Leahy Scale: Good     Standing balance support: Bilateral upper extremity supported Standing balance-Leahy Scale: Poor Standing balance comment: UE support on walker                            Communication Communication Communication: No apparent difficulties  Cognition Arousal: Alert Behavior During Therapy: Flat affect   PT - Cognitive impairments: No apparent impairments                         Following commands: Intact      Cueing Cueing Techniques: Verbal cues  Exercises General Exercises - Lower Extremity Ankle Circles/Pumps: AROM, 5 reps, Supine, Both Heel Slides: AROM, AAROM, Both, 5 reps, Supine    General Comments General comments (skin integrity, edema, etc.): SpO2 drop to 85% on RA back to 90% with cues for pursed lip breathing in <1  minute      Pertinent Vitals/Pain Pain Assessment Pain Assessment: No/denies pain    Home Living                          Prior Function            PT Goals (current goals can now be found in the care plan section) Progress towards PT goals: Progressing toward goals    Frequency    Min 3X/week      PT Plan      Co-evaluation              AM-PAC PT 6 Clicks Mobility   Outcome  Measure  Help needed turning from your back to your side while in a flat bed without using bedrails?: A Little Help needed moving from lying on your back to sitting on the side of a flat bed without using bedrails?: A Little Help needed moving to and from a bed to a chair (including a wheelchair)?: A Little Help needed standing up from a chair using your arms (e.g., wheelchair or bedside chair)?: A Little Help needed to walk in hospital room?: A Lot Help needed climbing 3-5 steps with a railing? : Total 6 Click Score: 15    End of Session Equipment Utilized During Treatment: Gait belt Activity Tolerance: Patient limited by fatigue Patient left: in bed;with call bell/phone within reach   PT Visit Diagnosis: Unsteadiness on feet (R26.81);Other abnormalities of gait and mobility (R26.89);Muscle weakness (generalized) (M62.81)     Time: 8396-8369 PT Time Calculation (min) (ACUTE ONLY): 27 min  Charges:    $Gait Training: 8-22 mins $Therapeutic Activity: 8-22 mins PT General Charges $$ ACUTE PT VISIT: 1 Visit                     Micheline Portal, PT Acute Rehabilitation Services Office:[phone] 09/25/2024    Montie Portal 09/25/2024, 5:50 PM

## 2024-09-25 NOTE — Progress Notes (Addendum)
 [facility] Progress Note   Subjective:  Seen in room No new c/o's today Off pressors, BP 120/40 Responding to IV steroids Also new onset HFrEF EF 30%, mod severe MR         Vitals:    09/25/24 1000 09/25/24 1100 09/25/24 1129 09/25/24 1200  BP: 131/64 (!) 87/57   124/65  Pulse: 73 70   73  Resp: 13 11   12   Temp:     97.9 F (36.6 C)    TempSrc:     Oral    SpO2: 96% 95%   98%  Weight:              Exam: Gen alert, no distress No jvd or bruits Chest clear bilat to bases RRR no MRG Abd soft ntnd no mass or ascites +bs,  MS deformity over the R hip, painful to touch Ext no LE or UE edema, no other edema Neuro is alert, Ox 3 , nf    PD cath intact   Home bp meds: None     OP PD: CCPD 7x week-[facility] From 09/10/24 --> 5 exchanges, fill vol 2250, 1.5hr dwell time Usually does 1/2 1.5% bags and 1/2 2.5% bags       Assessment/ Plan: Hypotension: responded to IV hydrocortisone , 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 Per CCM.  Lactic acidosis: resolved Hx pituitary apoplexy: on recent admission HFrEF: echo showed EF 30-35% which is new. Cardiology consulted.  ESRD: on CCPD. Cont PD nightly while here Volume: no signs of vol overload. Is 4-5 kg up now after IVFs. Will try mix of 1.5% and 2.5% fluids w/ PD tonight.  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      Myer Fret MD  [facility] 09/25/2024, 3:12 PM   Last Labs        Recent Labs  Lab 09/23/24 1431 09/24/24 0354 09/24/24 1731 09/25/24 0537  HGB 11.8* 9.2*  --  9.1*  ALBUMIN  1.9*  --   --   --   CALCIUM  8.1* 8.3*  --  8.4*  PHOS  --  3.9  --  3.7  CREATININE 14.09* 11.85*  --  11.08*  K 2.8* 2.9* 3.7 3.4*      Last Labs  No results for input(s): IRON , TIBC, FERRITIN in the last 168 hours.   Inpatient medications:  allopurinol   100 mg Oral Daily   aspirin   81 mg Oral Daily   budesonide  (PULMICORT ) nebulizer solution  0.25 mg  Nebulization BID   calcitRIOL   0.25 mcg Oral Once per day on Monday Wednesday Friday   Chlorhexidine  Gluconate Cloth  6 each Topical Daily   dolutegravir   50 mg Oral Daily   emtricitabine -tenofovir  AF  1 tablet Oral Daily   feeding supplement  237 mL Oral BID BM   fludrocortisone   0.1 mg Oral Daily   gabapentin   100 mg Oral TID   gentamicin  cream  1 Application Topical Daily   heparin  injection (subcutaneous)  5,000 Units Subcutaneous Q8H   hydrocortisone  sod succinate (SOLU-CORTEF ) inj  50 mg Intravenous Q12H   insulin  aspart  0-9 Units Subcutaneous Q4H   levothyroxine   150 mcg Oral Q0600   lidocaine   1 patch Transdermal Q24H   loratadine   10 mg Oral QHS   midodrine   5 mg Oral TID WC   mirtazapine   7.5 mg Oral QHS   multivitamin  1 tablet Oral QHS   pantoprazole   40 mg Oral Daily   rosuvastatin   10 mg Oral Daily         dextrose  30 mL/hr at 09/25/24 1234   dialysis solution 1.5% low-MG/low-CA     dialysis solution 1.5% low-MG/low-CA          busPIRone , cetaphil, docusate sodium , heparin  sodium (porcine) 3,000 Units in dialysis solution 1.5% low-MG/low-CA 6,000 mL dialysis solution, HYDROmorphone  (DILAUDID ) injection, ondansetron  (ZOFRAN ) IV, oxyCODONE , polyethylene glycol

## 2024-09-25 NOTE — Progress Notes (Signed)
 Came into patient's room to trouble shoot the Pd machine.

## 2024-09-25 NOTE — Telephone Encounter (Signed)
°  Kidney Transplant Evaluation Note:   Pt determined to be not a candidate for transplant at this time related to cardiac function. EF:30-35% and moderate to severe mitral valve regurgitation on echo on 09/24/24.  VM left for pt's daughter, Shakeria Robinette, at [phone] as pt is hospitalized at [facility] in the ICU.   In basket sent to outreach coordinator to notify pt's dialysis SW.   Letter sent to patient via pt portal and faxed to [facility] at [phone] and Dr. Norine at [phone].

## 2024-09-25 NOTE — Plan of Care (Signed)

## 2024-09-25 NOTE — Progress Notes (Signed)
 Brief PCCM Progress Note   Called both sister and daughter listed in contacts for daily update with no answer to either call.   Alexius Hangartner D. Harris, NP-C [facility] Personal contact information can be found on Amion  If no contact or response made please call [phone] 09/25/2024, 4:12 PM

## 2024-09-25 NOTE — Progress Notes (Addendum)
 NAME:  [name], MRN:  [id number], DOB:  1965-11-08, LOS: 2 ADMISSION DATE:  09/23/2024, CONSULTATION DATE:  09/23/2024 REFERRING MD:  Dr. Charlyn CHIEF COMPLAINT:  abdominal and back pain, shock    History of Present Illness:  [name] is a 58 year old female with history of ESRD on PD, HIV, aortic dissection s/p endovascular repair (08/25/2023), recent history of pituitary apoplexy, HTN, papillary thyroid  carcinoma s/p thyroidectomy, sarcoidosis, and T2DM who presented to the ED from dialysis for for back pain, abdominal pain and dyspnea. Initial blood pressure in the ED was 64/30. An I/O was placed and she received IVF resuscitation with improvement in her blood pressure. CTA CAP showed stable thoracoabdominal aortic aneurysm and dissection with no significant change in the endoluminal stent graft, no evidence of aortic leak or rupture and persistent pneumoperitoneum and pelvic free fluid in the setting of peritoneal dialysis catheter.    Interestingly she was recently admitted for similar presentation of nausea, vomiting, diarrhea and abdominal pain from 09/07/2024-09/12/2024 at which time a work up was negative for PD catheter associated peritonitis. She was treated empirically for C. Diff given recent antibiotics and C. Diff positive antigen. A subsequent C. Diff 09/08/2024 was negative.    Labs remarkable for ABG 7.64/17.9/193, K 2.8, BUN 39, Cr 14.09, alk phos 244, albumin  1.9, AST 19, ALT 9, troponin 338, lactate 5.0, Hgb 11.8, and platelets 214.   Pertinent  Medical History  ESRD on PD, anemia, anxiety, asthma, T2DM, aortic dissection s/p endovascular repair, GERD, Grave's disease, HTN, HLD, papillary thyroid  cancer s/p thyroidectomy (2018) PVD, falls, sarcoidosis, suppurative hydradenitis   Significant Hospital Events: Including procedures, antibiotic start and stop dates in addition to other pertinent events   12/8: admit to ICU for shock  12/10: No acute issues overnight,  off pressors after started hydrocortisone  and fludrocortisone    Interim History / Subjective:  Sleeping on my arrival but easily awakes, denies and current or recent chest pain   Objective   Blood pressure 105/63, pulse 73, temperature 97.8 F (36.6 C), temperature source Oral, resp. rate 11, weight 85.6 kg, last menstrual period 03/31/2014, SpO2 95%.    FiO2 (%):  [21 %] 21 %   Intake/Output Summary (Last 24 hours) at 09/25/2024 0734 Last data filed at 09/25/2024 0700 Gross per 24 hour  Intake 1393.83 ml  Output --  Net 1393.83 ml   Filed Weights   09/23/24 1730 09/24/24 0500 09/25/24 0544  Weight: 76.6 kg 80.1 kg 85.6 kg    Examination: General: Acute on chronically ill appearing middle aged lying in bed in NAD HEENT: [place]/AT, MM pink/moist, PERRL,  Neuro: Alert and oriented x3, non-focal  CV: s1s2 regular rate and rhythm, no murmur, rubs, or gallops,  PULM:  Clear to auscultation, no increased work of breathing, no added breath sounds on RA GI: soft, bowel sounds active in all 4 quadrants, non-tender, non-distended, tolerating oral diet Extremities: warm/dry, no edema  Skin: no rashes or lesions  Resolved Hospital Problem list   Shock, hypovolemic +/- septic  Lactic acidosis, resolved  Bradycardia  Assessment & Plan:  Adrenal insufficiency -Unfortunately due to recent pituitary apoplexy her ACTH  stim test risks being a false negative. Her electrolytes are also being corrected by dialysis so that doesn't help with diagnosis. A morning cortisol is <10.  History of pituitary apoplexy History of papillary thyroid  cancer s/p thyroidectomy -TSH normal P: Empiric stress dose hydrocortisone  and fludrocortisone  were initiated at 12/9, begin weaning to adrenal insufficiency dosing  today  AM met 12/10 shock state resolved with discontinuation of vasopressors Likely also discontinue Zosyn  today, will discuss with attending and pharmacy Follow cultures, including blood and  peritoneal Continue home Synthroid   Concern for PD site infection  -Low suspicion but culture remains pending  P: Continue Zosyn  until cultures finalize   HFrEF - Echocardiogram 12/9 with EF of 30 to 35% with LV wall motion abnormality and grade 2 diastolic dysfunction.  Drop in EF and WMA are both new compared to prior echoes P: Consult cardiology May benefit from ischemic workup Optimize electrolytes Continuous telemetry Goal of euvolemia, volume removal per HD Heart healthy diet GDMT as able  Elevated troponin -Elevated troponin likely due to demand ischemia. ECG sinus rhythm, LBBB (similar to prior). Troponins 300s  P: Cardiology consult as above, may need ischemic workup given new WMA on echo and elevated troponin  Acute low back pain Acute abdominal pain  -Reportedly low back pain present since dissection repair. CT dissection negative for any changes. MRI T and L spine negative.  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 CTA with stable dissection and endoluminal stent graft, no leak or rupture. Vascular saw patient with nothing to add  P: Medical optimization and supportive care   ESRD Hypokalemia Hypocalcemia Hypomagnesemia  -Query if chronic nausea/vomiting is related to PD failure.  P: Nephrology consult, appreciate assistance Ongoing nightly PD per nephrology Optimize electrolytes Continue home calcitriol   Chronic nausea/vomiting  Moderate malnutrition Decreased appetite Hypoalbuminemia - repleted P: RD and SLP following Continue regular diet Remains on low dose dextrose  drip with borderline CBGs, encourage oral intake may need to consider cortack for TF Ensure high-protein supplementation twice daily Continue renal multivitamin As needed antiemetics Continue Remeron   History of  HIV -CD4 445 on 10/15.  P: Continue home HAART   Anemia  -Macrocytic ; may be from chronic illness, HIV, meds  P: Trend CBC Transfuse per protocol Hemoglobin goal greater than 7  History of T2DM P: Continue SSI CBG goal 140/80 CBG checks every 4   Deconditioning P: PT/OT as able  Critical care time:   CRITICAL CARE Performed by: Jerimy Johanson D. Harris   Total critical care time: 40 minutes  Critical care time was exclusive of separately billable procedures and treating other patients.  Critical care was necessary to treat or prevent imminent or life-threatening deterioration.  Critical care was time spent personally by me on the following activities: development of treatment plan with patient and/or surrogate as well as nursing, discussions with consultants, evaluation of patient's response to treatment, examination of patient, obtaining history from patient or surrogate, ordering and performing treatments and interventions, ordering and review of laboratory studies, ordering and review of radiographic studies, pulse oximetry and re-evaluation of patient's condition.  Caliana Spires D. Harris, NP-C  Pulmonary & Critical Care Personal contact information can be found on Amion  If no contact or response made please call [phone] 09/25/2024, 7:55 AM

## 2024-09-26 ENCOUNTER — Ambulatory Visit: Payer: Medicare HMO | Admitting: Hematology and Oncology

## 2024-09-26 ENCOUNTER — Encounter (HOSPITAL_COMMUNITY)
Admission: EM | Disposition: A | Discharge status: Rehabilitation Facility | Payer: Self-pay | Source: Home / Self Care | Attending: Internal Medicine

## 2024-09-26 DIAGNOSIS — G609 Hereditary and idiopathic neuropathy, unspecified: Secondary | ICD-10-CM | POA: Diagnosis not present

## 2024-09-26 DIAGNOSIS — I5040 Unspecified combined systolic (congestive) and diastolic (congestive) heart failure: Secondary | ICD-10-CM | POA: Diagnosis not present

## 2024-09-26 DIAGNOSIS — I5021 Acute systolic (congestive) heart failure: Secondary | ICD-10-CM

## 2024-09-26 DIAGNOSIS — R5381 Other malaise: Secondary | ICD-10-CM

## 2024-09-26 DIAGNOSIS — R579 Shock, unspecified: Secondary | ICD-10-CM

## 2024-09-26 DIAGNOSIS — I214 Non-ST elevation (NSTEMI) myocardial infarction: Secondary | ICD-10-CM | POA: Diagnosis not present

## 2024-09-26 HISTORY — PX: RIGHT/LEFT HEART CATH AND CORONARY ANGIOGRAPHY: CATH118266

## 2024-09-26 LAB — BASIC METABOLIC PANEL WITH GFR
Anion gap: 14 (ref 5–15)
BUN: 37 mg/dL — ABNORMAL HIGH (ref 6–20)
CO2: 22 mmol/L (ref 22–32)
Calcium: 8.6 mg/dL — ABNORMAL LOW (ref 8.9–10.3)
Chloride: 94 mmol/L — ABNORMAL LOW (ref 98–111)
Creatinine, Ser: 10.62 mg/dL — ABNORMAL HIGH (ref 0.44–1.00)
GFR, Estimated: 4 mL/min — ABNORMAL LOW (ref >60)
Glucose, Bld: 166 mg/dL — ABNORMAL HIGH (ref 70–99)
Potassium: 3.8 mmol/L (ref 3.5–5.1)
Sodium: 130 mmol/L — ABNORMAL LOW (ref 135–145)

## 2024-09-26 LAB — CBC
HCT: 28.8 % — ABNORMAL LOW (ref 36.0–46.0)
Hemoglobin: 9.8 g/dL — ABNORMAL LOW (ref 12.0–15.0)
MCH: 37 pg — ABNORMAL HIGH (ref 26.0–34.0)
MCHC: 34 g/dL (ref 30.0–36.0)
MCV: 108.7 fL — ABNORMAL HIGH (ref 80.0–100.0)
Platelets: 226 K/uL (ref 150–400)
RBC: 2.65 MIL/uL — ABNORMAL LOW (ref 3.87–5.11)
RDW: 15.2 % (ref 11.5–15.5)
WBC: 16.1 K/uL — ABNORMAL HIGH (ref 4.0–10.5)
nRBC: 0.7 % — ABNORMAL HIGH (ref 0.0–0.2)

## 2024-09-26 LAB — POCT I-STAT EG7
Acid-base deficit: 6 mmol/L — ABNORMAL HIGH (ref 0.0–2.0)
Bicarbonate: 20.3 mmol/L (ref 20.0–28.0)
Calcium, Ion: 1.09 mmol/L — ABNORMAL LOW (ref 1.15–1.40)
HCT: 30 % — ABNORMAL LOW (ref 36.0–46.0)
Hemoglobin: 10.2 g/dL — ABNORMAL LOW (ref 12.0–15.0)
O2 Saturation: 60 %
Potassium: 4.2 mmol/L (ref 3.5–5.1)
Sodium: 131 mmol/L — ABNORMAL LOW (ref 135–145)
TCO2: 22 mmol/L (ref 22–32)
pCO2, Ven: 41.2 mmHg — ABNORMAL LOW (ref 44–60)
pH, Ven: 7.3 (ref 7.25–7.43)
pO2, Ven: 34 mmHg (ref 32–45)

## 2024-09-26 LAB — POCT I-STAT 7, (LYTES, BLD GAS, ICA,H+H)
Acid-base deficit: 7 mmol/L — ABNORMAL HIGH (ref 0.0–2.0)
Bicarbonate: 18.9 mmol/L — ABNORMAL LOW (ref 20.0–28.0)
Calcium, Ion: 1.08 mmol/L — ABNORMAL LOW (ref 1.15–1.40)
HCT: 29 % — ABNORMAL LOW (ref 36.0–46.0)
Hemoglobin: 9.9 g/dL — ABNORMAL LOW (ref 12.0–15.0)
O2 Saturation: 94 %
Potassium: 4.2 mmol/L (ref 3.5–5.1)
Sodium: 131 mmol/L — ABNORMAL LOW (ref 135–145)
TCO2: 20 mmol/L — ABNORMAL LOW (ref 22–32)
pCO2 arterial: 36.6 mmHg (ref 32–48)
pH, Arterial: 7.322 — ABNORMAL LOW (ref 7.35–7.45)
pO2, Arterial: 78 mmHg — ABNORMAL LOW (ref 83–108)

## 2024-09-26 LAB — GLUCOSE, CAPILLARY
Glucose-Capillary: 206 mg/dL — ABNORMAL HIGH (ref 70–99)
Glucose-Capillary: 80 mg/dL (ref 70–99)
Glucose-Capillary: 86 mg/dL (ref 70–99)
Glucose-Capillary: 84 mg/dL (ref 70–99)
Glucose-Capillary: 120 mg/dL — ABNORMAL HIGH (ref 70–99)

## 2024-09-26 LAB — MAGNESIUM: Magnesium: 2.3 mg/dL (ref 1.7–2.4)

## 2024-09-26 LAB — PHOSPHORUS: Phosphorus: 4.3 mg/dL (ref 2.5–4.6)

## 2024-09-26 SURGERY — RIGHT/LEFT HEART CATH AND CORONARY ANGIOGRAPHY
Anesthesia: LOCAL

## 2024-09-26 MED ORDER — FENTANYL CITRATE (PF) 100 MCG/2ML IJ SOLN
INTRAMUSCULAR | Status: DC | PRN
  Administered 2024-09-26: 25 ug via INTRAVENOUS

## 2024-09-26 MED ORDER — VERAPAMIL HCL 2.5 MG/ML IV SOLN
INTRAVENOUS | Status: AC
  Filled 2024-09-26: qty 2

## 2024-09-26 MED ORDER — VERAPAMIL HCL 2.5 MG/ML IV SOLN
INTRAVENOUS | Status: DC | PRN
  Administered 2024-09-26: 10 mL via INTRA_ARTERIAL

## 2024-09-26 MED ORDER — ACETAMINOPHEN 325 MG PO TABS: 650.0000 mg | ORAL_TABLET | ORAL | Status: DC | PRN

## 2024-09-26 MED ORDER — HEPARIN (PORCINE) IN NACL 1000-0.9 UT/500ML-% IV SOLN
INTRAVENOUS | Status: DC | PRN
  Administered 2024-09-26: 1000 mL

## 2024-09-26 MED ORDER — SODIUM CHLORIDE 0.9% FLUSH: 3.0000 mL | INTRAVENOUS | Status: DC | PRN

## 2024-09-26 MED ORDER — LIDOCAINE HCL (PF) 1 % IJ SOLN
INTRAMUSCULAR | Status: AC
  Filled 2024-09-26: qty 30

## 2024-09-26 MED ORDER — LIDOCAINE HCL (PF) 1 % IJ SOLN
INTRAMUSCULAR | Status: DC | PRN
  Administered 2024-09-26: 5 mL

## 2024-09-26 MED ORDER — SODIUM CHLORIDE 0.9 % IV SOLN: 250.0000 mL | INTRAVENOUS | Status: AC | PRN

## 2024-09-26 MED ORDER — HEPARIN SODIUM (PORCINE) 1000 UNIT/ML IJ SOLN
INTRAMUSCULAR | Status: DC | PRN
  Administered 2024-09-26: 4000 [IU] via INTRAVENOUS

## 2024-09-26 MED ORDER — ASPIRIN 81 MG PO CHEW: 81.0000 mg | CHEWABLE_TABLET | ORAL | Status: DC

## 2024-09-26 MED ORDER — DELFLEX-LC/1.5% DEXTROSE 344 MOSM/L IP SOLN: INTRAPERITONEAL | Status: DC

## 2024-09-26 MED ORDER — MIDAZOLAM HCL (PF) 2 MG/2ML IJ SOLN
INTRAMUSCULAR | Status: DC | PRN
  Administered 2024-09-26: 1 mg via INTRAVENOUS

## 2024-09-26 MED ORDER — FENTANYL CITRATE (PF) 100 MCG/2ML IJ SOLN
INTRAMUSCULAR | Status: AC
  Filled 2024-09-26: qty 2

## 2024-09-26 MED ORDER — ORAL CARE MOUTH RINSE: 15.0000 mL | OROMUCOSAL | Status: DC | PRN

## 2024-09-26 MED ORDER — SODIUM CHLORIDE 0.9% FLUSH: 3.0000 mL | Freq: Two times a day (BID) | INTRAVENOUS | Status: DC

## 2024-09-26 MED ORDER — MIDAZOLAM HCL 2 MG/2ML IJ SOLN
INTRAMUSCULAR | Status: AC
  Filled 2024-09-26: qty 2

## 2024-09-26 MED ORDER — HEPARIN SODIUM (PORCINE) 1000 UNIT/ML IJ SOLN
INTRAMUSCULAR | Status: AC
  Filled 2024-09-26: qty 10

## 2024-09-26 SURGICAL SUPPLY — 9 items
CATH BALLN WEDGE 5F 110CM (CATHETERS) IMPLANT
CATH INFINITI AMBI 5FR JK (CATHETERS) IMPLANT
CATH INFINITI JR4 5F (CATHETERS) IMPLANT
DEVICE RAD COMP TR BAND LRG (VASCULAR PRODUCTS) IMPLANT
GLIDESHEATH SLEND SS 6F .021 (SHEATH) IMPLANT
GUIDEWIRE INQWIRE 1.5J.035X260 (WIRE) IMPLANT
PACK CARDIAC CATHETERIZATION (CUSTOM PROCEDURE TRAY) ×1 IMPLANT
SET ATX-X65L (MISCELLANEOUS) IMPLANT
SHEATH GLIDE SLENDER 4/5FR (SHEATH) IMPLANT

## 2024-09-26 NOTE — Interval H&P Note (Signed)
 History and Physical Interval Note:  09/26/2024 2:38 PM  [name]  has presented today for surgery, with the diagnosis of NSTEMI.  The various methods of treatment have been discussed with the patient and family. After consideration of risks, benefits and other options for treatment, the patient has consented to  Procedures: RIGHT/LEFT HEART CATH AND CORONARY ANGIOGRAPHY (N/A) as a surgical intervention.  The patient's history has been reviewed, patient examined, no change in status, stable for surgery.  I have reviewed the patient's chart and labs.  Questions were answered to the patient's satisfaction.     Justice Aguirre

## 2024-09-26 NOTE — Plan of Care (Signed)
°  Problem: Coping: Goal: Ability to adjust to condition or change in health will improve Outcome: Progressing   Problem: Tissue Perfusion: Goal: Adequacy of tissue perfusion will improve Outcome: Progressing   Problem: Education: Goal: Knowledge of General Education information will improve Description: Including pain rating scale, medication(s)/side effects and non-pharmacologic comfort measures Outcome: Progressing   Problem: Activity: Goal: Risk for activity intolerance will decrease Outcome: Progressing   Problem: Coping: Goal: Level of anxiety will decrease Outcome: Progressing   Problem: Activity: Goal: Ability to return to baseline activity level will improve Outcome: Progressing

## 2024-09-26 NOTE — PMR Pre-admission (Signed)
 PMR Admission Coordinator Pre-Admission Assessment  Patient: [name] is an 58 y.o., female MRN: [id number] DOB: Mar 01, 1966 Height:   Weight: 83.4 kg  Insurance Information HMO: yes    PPO:      PCP:      IPA:      80/20:      OTHER:  PRIMARY: [employer]      Policy#: [id number]       Subscriber: pt CM Name: Caswell BIRCH      Phone#: [phone]     Fax#: [phone] Pre-Cert#: [id number] auth for CIR from Caswell Solian with [facility] Medicare for admit 12/10 with discharge information to fax listed above 24 hours prior to discharge.         Employer:  Benefits:  Phone #: [phone]     Name:  Eff. Date: 10/18/23     Deduct: $0      Out of Pocket Max: [phone] (met [phone])      Life Max: n/a CIR: $399/day for days 1-7      SNF: $10/day Outpatient:      Co-Pay: $25/visit Home Health: 100%      Co-Pay: DME: 80%     Co-Pay: 20% Providers:  SECONDARY:       Policy#:      Phone#:   Artist:       Phone#:   The Best Boy for patients in Inpatient Rehabilitation Facilities with attached Privacy Act Statement-Health Care Records was provided and verbally reviewed with: Patient and Family  Emergency Contact Information Contact Information     Name Relation Home Work Mobile   Mason,Share Sister [phone]  [phone]   Platz,Deandra Daughter [phone]  [phone]      Other Contacts     Name Relation Home Work Mobile   Mason,Nocita Sister [phone]     MASON,JAMES Father [phone]  [phone]       Current Medical History  Patient Admitting Diagnosis: circulatory shock, sepsis, renal failure  History of Present Illness: Pt is a 58 y/o female with PMH of ESRD (on PD), recent pituitary hemorrhage, HIV, aortic dissection s/p repair, who was admitted to [name] from her dialysis center with back and abdominal pain and dyspnea.  In ED her BP was 64/30 and she received aggressive IV fluids.  CTA abdomen did not demonstrate  change in aortic aneurysm/graft or signs of fluid in abdomen.  EKG showed L BBB with PACs.  Echo showed EF 30-35% with L ventricular WMAs and grade 2 diastolic dysfunction with moderate to severe mitral regurgitation.  Cardiology consulted and performed bilateral heart cath on 12/11 which showed tortuous but normal coronary arteries. New HF likely due to stress cardiomyopathy.   Plan for GDMT as pt able to tolerate.  Nephrology following for management of Peritoneal Dialysis. Will need close medical management/monitoring of cardiac and renal status while adjusting medications.  Therapy has been ongoing and pt has been recommended for CIR due to functional decline in the setting of multiple medical issues.      Patient's medical record from [name] has been reviewed by the rehabilitation admission coordinator and physician.  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/11/2021   COVID-19 virus infection 04/28/2022   Diabetes mellitus without complication (HCC)    type 2   Dissecting abdominal aortic aneurysm (HCC) 02/05/2023   DVT (deep venous thrombosis) (HCC) 08/29/2023   post-op DVT left IJ, brachial, axillary, subclavian veins 08/29/23   DVT of upper extremity (deep vein thrombosis) (HCC) 09/03/2023   Dysphagia 07/23/2019   Encephalopathy acute 02/02/2023   Family history of breast cancer    Family history of lung cancer    Family history of prostate cancer     Gastroenteritis 07/10/2007   Genetic testing 07/25/2018   [employer] + RNA Insight was ordered.  Genes Analyzed (43 total): APC*, ATM*, AXIN2, BARD1, BMPR1A, BRCA1*, BRCA2*, BRIP1*, CDH1*, CDK4, CDKN2A, CHEK2*, DICER1, GALNT12, HOXB13, MEN1, MLH1*, MRE11A, MSH2*, MSH3, MSH6*, MUTYH*, NBN, NF1*, NTHL1, PALB2*, PMS2*, POLD1, POLE, PTEN*, RAD50, RAD51C*, RAD51D*, RET, SDHB, SDHD, SMAD4, SMARCA4, STK11 and TP53* (sequencing and deletion/duplication); EGFR (s   GERD 07/24/2006   GRAVE'S DISEASE 01/01/2008   History of hidradenitis suppurativa    History of kidney stones    History of thrush    HIV DISEASE 07/24/2006   dx March 05   Hyperlipidemia    HYPERTENSION 07/24/2006   Hyperthyroidism 08/2006   Grave's Disease -diffuse radiotracer uptake 08/25/06 Thyroid  scan-Cold nodule to R lower lobe of thyrorid   Ileus (HCC) 02/14/2023   Left bundle branch block (LBBB)    Malnutrition of moderate degree 02/08/2023   Menometrorrhagia    hx of   Nephrolithiasis    Panniculitis 05/12/2020   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 Completed 2 yrs Prednisone  after lung bx confirmation. No symptoms since then.   Sebaceous cyst 04/21/2020   Suppurative hidradenitis    Thyroid  cancer (HCC)    THYROID  NODULE, RIGHT 02/08/2007    Has the patient had major surgery during 100 days prior to admission? No  Family History   family  history includes Breast cancer in her cousin, cousin, cousin, maternal aunt, paternal aunt, and paternal aunt; Breast cancer (age of onset: 32) in her cousin; Breast cancer (age of onset: 14) in her paternal aunt; Breast cancer (age of onset: 45) in her maternal aunt; Cancer in her maternal uncle and mother; Diabetes in an other family member; Heart disease in her mother and another family member; Hypertension in her brother, father, mother, sister, sister, and another family member; Kidney disease in an other family member; Lung cancer in her paternal uncle; Lung cancer (age of onset: 75) in her father; Pancreatic cancer in her father; Prostate cancer in her paternal uncle and paternal uncle; Stroke in an other family member.  Current Medications Current Medications[1]  Patients Current Diet:  Diet Order             Diet NPO time specified Except for: Sips with Meds  Diet effective 0500                   Precautions / Restrictions Precautions Precautions: Fall Precaution/Restrictions Comments: PD catheter Restrictions Weight Bearing Restrictions Per Provider Order: No   Has the patient had 2 or more falls or a fall with injury in the past year? Yes  Prior Activity Level Community (5-7x/wk): 1 month prior to admit was mod I with mobility/ADLs/iADLs, recently has been requiring some assist for ADLs and not mobilizing as much  Prior Functional Level Self Care: Did the patient need help bathing, dressing, using the toilet or eating? Independent  Indoor Mobility: Did the patient need assistance with walking from room to room (with or without device)? Independent  Stairs: Did the patient need assistance with internal or external stairs (with or without device)? Independent  Functional Cognition: Did the patient need help planning regular tasks such as shopping or remembering to take medications? Independent  Patient Information Are you of Hispanic, Latino/a,or Spanish origin?: A.  No, not of Hispanic, Latino/a, or Spanish origin What is your race?: B. Black or African American Do you need or want an interpreter to communicate with a doctor or health care staff?: 0. No  Patient's Response To:  Health Literacy and Transportation Is the patient able to respond to health literacy and transportation needs?: Yes Health Literacy - How often do you need to have someone help you when you read instructions, pamphlets, or other written material from your doctor or pharmacy?: Never In the past 12 months, has lack of transportation kept you from medical appointments or from getting medications?: No In the past 12 months, has lack of transportation kept you from meetings, work, or from getting things needed for daily living?: No  Home Assistive Devices / Equipment Home Equipment: Agricultural Consultant (2 wheels)  Prior Device Use: Indicate devices/aids used by the patient prior to current illness, exacerbation or injury? Walker  Current Functional Level Cognition  Orientation Level: Oriented X4    Extremity Assessment (includes Sensation/Coordination)  Upper Extremity Assessment: Generalized weakness  Lower Extremity Assessment: Defer to PT evaluation    ADLs  Overall ADL's : Needs assistance/impaired Eating/Feeding: NPO Grooming: Contact guard assist, Standing Upper Body Bathing: Set up, Sitting Lower Body Bathing: Minimal assistance, Sit to/from stand Upper Body Dressing : Set up, Sitting Lower Body Dressing: Minimal assistance, Sit to/from stand Toilet Transfer: Contact guard assist, Rolling walker (2 wheels), Ambulation Functional mobility during ADLs: Contact guard assist, Rolling walker (2 wheels)    Mobility  Overal bed mobility: Needs Assistance Bed Mobility: Supine to Sit, Sit to Supine Supine to sit: Supervision Sit to supine: Supervision General bed mobility comments: assist for lines    Transfers  Overall transfer level: Needs assistance Equipment used:  Rolling walker (2 wheels) Transfers: Sit to/from Stand Sit to Stand: Min assist, +2 physical assistance General transfer comment: Min A for balance stabilization. 2 person assist for safety with lines/leads.    Ambulation / Gait / Stairs / Wheelchair Mobility  Ambulation/Gait Ambulation/Gait assistance: Min assist, +2 safety/equipment Gait Distance (Feet): 35 Feet (&12', 20') Assistive device: Rolling walker (2 wheels) Gait Pattern/deviations: Step-through pattern, Decreased stride length, Wide base of support, Shuffle General Gait Details: tremors noted and pt reported feeling shaky, seated rest x 2, though VSS except one episode desaturation, improved with pursed lip breathing  on RA Gait velocity: decreased Gait velocity interpretation: <1.31 ft/sec, indicative of household ambulator    Posture / Balance Balance Overall balance assessment: Needs assistance Sitting-balance support: Bilateral upper extremity supported, Feet supported Sitting balance-Leahy Scale: Good Standing balance support: Bilateral upper extremity supported Standing balance-Leahy Scale: Poor Standing balance comment: UE support on walker    Special considerations/life events  Dialysis: Peritoneal, Oxygen  intermittently via [place] in hospital setting, not at home, and Diabetic management yes   Previous Home Environment (from acute therapy documentation) Living Arrangements: Alone Available Help at Discharge: Family, Available 24 hours/day Type of Home: Other(Comment) (townhome) Home Layout: Two level, 1/2 bath on main level, Bed/bath upstairs, Able to live on main level with bedroom/bathroom Alternate Level Stairs-Rails: Right, Left, Can reach both Alternate Level Stairs-Number of Steps: 10 Home Access: Stairs to enter Entrance Stairs-Rails: None Entrance Stairs-Number of Steps: 2 Bathroom Shower/Tub: Associate Professor: Yes Home Care Services: No  Discharge Living  Setting Plans for Discharge Living Setting: Patient's home Type of Home at Discharge: House Discharge Home Layout: Two level, Bed/bath upstairs, 1/2 bath on main level Alternate Level Stairs-Rails: Right, Left Alternate Level Stairs-Number of Steps: 10 Discharge Home Access: Stairs to enter Entrance Stairs-Rails: None Entrance Stairs-Number of Steps: 2 Discharge Bathroom Shower/Tub: Tub/shower unit Discharge Bathroom Toilet: Standard Discharge Bathroom Accessibility: Yes How Accessible: Accessible via walker Does the patient have any problems obtaining your medications?: No  Social/Family/Support Systems Anticipated Caregiver: sister, Share coordintaing Anticipated Caregiver's Contact Information: [phone] Ability/Limitations of Caregiver: intermittent assist available, family have been rotating to assisting with PD needs, meals, driving, etc. Caregiver Availability: Intermittent Discharge Plan Discussed with Primary Caregiver: Yes Is Caregiver In Agreement with Plan?: Yes Does Caregiver/Family have Issues with Lodging/Transportation while Pt is in Rehab?: No  Goals Patient/Family Goal for Rehab: PT/OT mod I, SLP n/a Expected length of stay: 7-10 days Additional Information: Discharge plan: home with intermittent support from family as previously; townhome can be set up for main floor access if needed Pt/Family Agrees to Admission and willing to participate: Yes Program Orientation Provided & Reviewed with Pt/Caregiver Including Roles  & Responsibilities: Yes  Decrease burden of Care through IP rehab admission: n/a  Possible need for SNF placement upon discharge: No.  Plan to discharge home to pt's previous living environment with family providing intermittent assist.   Patient Condition: This patient's condition remains as documented in the consult dated 12/11, in which the Rehabilitation Physician determined and documented that the patient's condition is appropriate for  intensive rehabilitative care in an inpatient rehabilitation facility. Will admit to inpatient rehab today.  Preadmission Screen Completed By:  Reche FORBES Lowers, 09/26/2024 3:02 PM ______________________________________________________________________   Discussed status with Dr. Babs on 09/27/2024  at 3:35 PM  and received approval for admission today.  Admission Coordinator:  Caitlin E Warren, PT, time 3:35 PM Pattricia 09/27/2024    Assessment/Plan: Diagnosis: debility after circulatory shock, multiple medical Does the need for close, 24 hr/day Medical supervision in concert with the patient's rehab needs make it unreasonable for this patient to be served in a less intensive setting? Yes Co-Morbidities requiring supervision/potential complications: ESRD on PD, peripheral neuropathy, HF, HIV Due to bladder management, bowel management, safety, skin/wound care, disease management, medication administration, pain management, and patient education, does the patient require 24 hr/day rehab nursing? Yes Does the patient require coordinated care of a physician, rehab nurse, PT, OT,  to address physical and functional deficits in the context of the above  medical diagnosis(es)? Yes Addressing deficits in the following areas: balance, endurance, locomotion, strength, transferring, bowel/bladder control, bathing, dressing, feeding, toileting, and psychosocial support Can the patient actively participate in an intensive therapy program of at least 3 hrs of therapy 5 days a week? Yes The potential for patient to make measurable gains while on inpatient rehab is excellent Anticipated functional outcomes upon discharge from inpatient rehab: modified independent PT, modified independent OT, n/a SLP Estimated rehab length of stay to reach the above functional goals is: 7-10 days Anticipated discharge destination: Home 10. Overall Rehab/Functional Prognosis: excellent   MD Signature: Arthea IVAR Gunther, MD,  [facility] Physical Medicine & Rehabilitation Medical Director Rehabilitation Services 09/27/2024      [1]  Current Facility-Administered Medications:    [MAR Hold] allopurinol  (ZYLOPRIM ) tablet 100 mg, 100 mg, Oral, Daily, Claudene Toribio BROCKS, MD, 100 mg at 09/26/24 0935   [MAR Hold] aspirin  chewable tablet 81 mg, 81 mg, Oral, Daily, Harris, Whitney D, NP, 81 mg at 09/26/24 0507   aspirin  chewable tablet 81 mg, 81 mg, Oral, Pre-Cath, Arida, Deatrice LABOR, MD   [MAR Hold] budesonide  (PULMICORT ) nebulizer solution 0.25 mg, 0.25 mg, Nebulization, BID, Claudene Toribio BROCKS, MD, 0.25 mg at 09/26/24 9178   [facility] Hold] busPIRone  (BUSPAR ) tablet 10 mg, 10 mg, Oral, BID PRN, Claudene Toribio BROCKS, MD, 10 mg at 09/24/24 2144   [facility] Hold] calcitRIOL  (ROCALTROL ) capsule 0.25 mcg, 0.25 mcg, Oral, Once per day on Monday Wednesday Friday, Tanda, Tara N, PA-C, 0.25 mcg at 09/25/24 9148   [facility] Hold] cetaphil lotion, , Topical, PRN, Autry, Lauren E, PA-C   [MAR Hold] Chlorhexidine  Gluconate Cloth 2 % PADS 6 each, 6 each, Topical, Daily, Claudene Toribio BROCKS, MD, 6 each at 09/25/24 2030   [facility] Hold] dialysis solution 1.5% low-MG/low-CA dianeal  solution, , Intraperitoneal, Q24H, Geralynn Charleston, MD   [facility] Hold] docusate sodium  (COLACE) capsule 100 mg, 100 mg, Oral, BID PRN, Claudene Toribio BROCKS, MD   [facility] Hold] dolutegravir  ([name] ) tablet 50 mg, 50 mg, Oral, Daily, Claudene Toribio BROCKS, MD, 50 mg at 09/26/24 9062   [facility] Hold] emtricitabine -tenofovir  AF (DESCOVY ) 200-25 MG per tablet 1 tablet, 1 tablet, Oral, Daily, Claudene Toribio BROCKS, MD, 1 tablet at 09/26/24 9062   [facility] Hold] feeding supplement (ENSURE PLUS HIGH PROTEIN) liquid 237 mL, 237 mL, Oral, BID BM, Claudene Toribio BROCKS, MD, 237 mL at 09/25/24 1447   fentaNYL  (SUBLIMAZE ) injection, , , PRN, Arida, Muhammad A, MD, 25 mcg at 09/26/24 1448   [MAR Hold] fludrocortisone  (FLORINEF ) tablet 0.1 mg, 0.1 mg, Oral, Daily, Autry, Lauren E, PA-C, 0.1 mg at 09/26/24 0936   [MAR Hold] gentamicin   cream (GARAMYCIN ) 0.1 % 1 Application, 1 Application, Topical, Daily, Geralynn Charleston, MD, 1 Application at 09/25/24 1955   Heparin  (Porcine) in NaCl 1000-0.9 UT/500ML-% SOLN, , , PRN, Arida, Muhammad A, MD, 1,000 mL at 09/26/24 1451   [MAR Hold] heparin  injection 5,000 Units, 5,000 Units, Subcutaneous, Q8H, Autry, Lauren E, PA-C, 5,000 Units at 09/26/24 0509   [facility] Hold] heparin  sodium (porcine) 3,000 Units in dialysis solution 1.5% low-MG/low-CA 6,000 mL dialysis solution, , Peritoneal Dialysis, PRN, Claudene Toribio BROCKS, MD   [facility] Hold] heparin  sodium (porcine) 3,000 Units in dialysis solution 2.5% low-MG/low-CA 6,000 mL dialysis solution, , Peritoneal Dialysis, PRN, Geralynn Charleston, MD   heparin  sodium (porcine) injection, , , PRN, Darron Deatrice LABOR, MD, 4,000 Units at 09/26/24 1456   [MAR Hold] hydrocortisone  sodium succinate  (SOLU-CORTEF ) 100 MG injection 50 mg, 50 mg,  Intravenous, Q12H, Arloa Folks D, NP, 50 mg at 09/26/24 0834   [facility] Hold] HYDROmorphone  (DILAUDID ) injection 0.5 mg, 0.5 mg, Intravenous, Q3H PRN, Claudene Toribio BROCKS, MD, 0.5 mg at 09/25/24 2356   [MAR Hold] insulin  aspart (novoLOG ) injection 0-9 Units, 0-9 Units, Subcutaneous, Q4H, Claudene Toribio BROCKS, MD, 3 Units at 09/26/24 0350   [MAR Hold] levothyroxine  (SYNTHROID ) tablet 150 mcg, 150 mcg, Oral, Q0600, Claudene Toribio BROCKS, MD, 150 mcg at 09/26/24 0507   [MAR Hold] lidocaine  (LIDODERM ) 5 % 1 patch, 1 patch, Transdermal, Q24H, Claudene Toribio BROCKS, MD, 1 patch at 09/24/24 1941   lidocaine  (PF) (XYLOCAINE ) 1 % injection, , , PRN, Arida, Muhammad A, MD, 5 mL at 09/26/24 1448   [MAR Hold] loratadine  (CLARITIN ) tablet 10 mg, 10 mg, Oral, QHS, Claudene Toribio BROCKS, MD, 10 mg at 09/25/24 2211   midazolam  PF (VERSED ) injection, , , PRN, Arida, Muhammad A, MD, 1 mg at 09/26/24 1448   [MAR Hold] midodrine  (PROAMATINE ) tablet 5 mg, 5 mg, Oral, TID WC, Autry, Lauren E, PA-C, 5 mg at 09/25/24 1630   [MAR Hold] mirtazapine  (REMERON ) tablet 7.5 mg, 7.5 mg, Oral,  QHS, Autry, Lauren E, PA-C, 7.5 mg at 09/25/24 2211   [MAR Hold] multivitamin (RENA-VIT) tablet 1 tablet, 1 tablet, Oral, QHS, Desai, Nikita S, MD, 1 tablet at 09/25/24 2211   [facility] Hold] ondansetron  (ZOFRAN ) injection 4 mg, 4 mg, Intravenous, Q6H PRN, Claudene Toribio BROCKS, MD   [facility] Hold] Oral care mouth rinse, 15 mL, Mouth Rinse, PRN, Lue Elsie BROCKS, MD   [facility] Hold] oxyCODONE  (Oxy IR/ROXICODONE ) immediate release tablet 5 mg, 5 mg, Oral, Q6H PRN, Harris, Whitney D, NP   [MAR Hold] pantoprazole  (PROTONIX ) EC tablet 40 mg, 40 mg, Oral, Daily, Claudene Toribio BROCKS, MD, 40 mg at 09/26/24 0936   [MAR Hold] polyethylene glycol (MIRALAX  / GLYCOLAX ) packet 17 g, 17 g, Oral, Daily PRN, Claudene Toribio BROCKS, MD   Radial Cocktail/Verapamil  only, , , PRN, Arida, Muhammad A, MD, 10 mL at 09/26/24 1451   [MAR Hold] rosuvastatin  (CRESTOR ) tablet 10 mg, 10 mg, Oral, Daily, Claudene Toribio BROCKS, MD, 10 mg at 09/26/24 [phone]

## 2024-09-26 NOTE — Progress Notes (Signed)
 Pt transferred to unit from 61M. Pt alert and oriented X4 on arrival. Assessment performed, CCMD set up and central line flushed. Pt did not report being in any pain. Call light within reach.

## 2024-09-26 NOTE — Consult Note (Signed)
 Physical Medicine and Rehabilitation Consult Reason for Consult:Impaired functional mobility Referring Physician: Lue   HPI: [name] is a 58 y.o. female with a history of end-stage renal disease on peritoneal dialysis, recent pituitary hemorrhage/apoplexy, HIV, prior aortic dissection with repair, multiple other medical issues, who presented to the emergency room on 09/23/2024 after dialysis with back, abdominal pain and dyspnea.  Blood pressure upon presentation was 64/30.  Patient received aggressive IV fluids.  CTA of the abdomen did not not demonstrate any significant change in aortic aneurysm/graft, nor any signs of fluid or air in the abdomen.  Patient with elevated troponins.  EKG demonstrated left bundle branch block with PACs.  Patient was started on empiric antibiotics as well as stress dose steroids given concern for adrenal insufficiency.  Echocardiogram on 09/24/2024 demonstrated ejection fraction of 30 to 35% with left ventricular wall motion abnormality and grade 2 diastolic dysfunction with moderate to severe MR.  Patient with persistent back pain and MRI of the lumbar and thoracic spine was unrevealing for any acute findings.  Nephrology follows the patient for end-stage renal disease/peritoneal dialysis.  Cardiology consulted with plan to perform left and right heart catheterizations today.  Patient has been up with therapies and has been min assist for sit to stand transfers and has walked 35 feet min assist using a rolling walker.  Patient lives alone in a two-level home although she can live on the first floor.  There are 2 steps to enter.  Patient was independent prior to arrival but was using rolling walker more recently and becoming more sedentary overall.    Home: Home Living Family/patient expects to be discharged to:: Private residence Living Arrangements: Alone Available Help at Discharge: Family, Available 24 hours/day Type of Home:  Other(Comment) (townhome) Home Access: Stairs to enter [employer] of Steps: 2 Entrance Stairs-Rails: None Home Layout: Two level, 1/2 bath on main level, Bed/bath upstairs, Able to live on main level with bedroom/bathroom Alternate Level Stairs-Number of Steps: 10 Alternate Level Stairs-Rails: Right, Left, Can reach both Bathroom Shower/Tub: Tub/shower unit Bathroom Toilet: Standard Bathroom Accessibility: Yes Home Equipment: [employer] (2 wheels)  Functional History: Prior Function Prior Level of Function : Independent/Modified Independent Mobility Comments: recently using RW, spending more and more time upstairs in her room. ADLs Comments: Mod I with ADL's prior to one month ago; for the past month, sister assisting intermittently, predominantly with bathing and bringing meals Functional Status:  Mobility: Bed Mobility Overal bed mobility: Needs Assistance Bed Mobility: Supine to Sit, Sit to Supine Supine to sit: Supervision Sit to supine: Supervision General bed mobility comments: assist for lines Transfers Overall transfer level: Needs assistance Equipment used: Rolling walker (2 wheels) Transfers: Sit to/from Stand Sit to Stand: Min assist, +2 physical assistance General transfer comment: Min A for balance stabilization. 2 person assist for safety with lines/leads. Ambulation/Gait Ambulation/Gait assistance: Min assist, +2 safety/equipment Gait Distance (Feet): 35 Feet (&12', 20') Assistive device: Rolling walker (2 wheels) Gait Pattern/deviations: Step-through pattern, Decreased stride length, Wide base of support, Shuffle General Gait Details: tremors noted and pt reported feeling shaky, seated rest x 2, though VSS except one episode desaturation, improved with pursed lip breathing on RA Gait velocity: decreased Gait velocity interpretation: <1.31 ft/sec, indicative of household ambulator    ADL: ADL Overall ADL's : Needs  assistance/impaired Eating/Feeding: NPO Grooming: Contact guard assist, Standing Upper Body Bathing: Set up, Sitting Lower Body Bathing: Minimal assistance, Sit to/from stand Upper Body Dressing :  Set up, Sitting Lower Body Dressing: Minimal assistance, Sit to/from stand Toilet Transfer: Contact guard assist, Rolling walker (2 wheels), Ambulation Functional mobility during ADLs: Contact guard assist, Rolling walker (2 wheels)  Cognition: Cognition Orientation Level: Oriented X4 Cognition Arousal: Alert Behavior During Therapy: Flat affect   Review of Systems  Constitutional:  Positive for malaise/fatigue. Negative for fever.  HENT: Negative.    Eyes: Negative.   Respiratory:  Positive for cough and shortness of breath.   Cardiovascular:  Negative for chest pain.  Gastrointestinal:  Positive for abdominal pain.  Genitourinary:  Positive for flank pain.  Musculoskeletal:  Positive for back pain and myalgias.  Skin: Negative.   Neurological:  Positive for tingling, sensory change and weakness.  Psychiatric/Behavioral:  Negative for hallucinations.    Past Medical History:  Diagnosis Date   Acute pain of right shoulder due to trauma 06/08/2023   Acute pancreatitis 10/23/2021   Acute renal failure superimposed on stage 4 chronic kidney disease (HCC) 10/24/2021   Allergy    Anemia    Normocytic   Antibiotic-induced yeast infection 09/28/2020   Anxiety    Asthma    Bronchitis 2005   Bursitis of left shoulder 09/06/2019   CKD (chronic kidney disease)    Dialysis on Mon-Wed- Fri   CLASS 1-EXOPHTHALMOS-THYROTOXIC 02/08/2007   Cognitive dysfunction 02/21/2020   COVID-19 long hauler 05/11/2021   COVID-19 virus infection 04/28/2022   Diabetes mellitus without complication (HCC)    type 2   Dissecting abdominal aortic aneurysm (HCC) 02/05/2023   DVT (deep venous thrombosis) (HCC) 08/29/2023   post-op DVT left IJ, brachial, axillary, subclavian veins 08/29/23   DVT of upper  extremity (deep vein thrombosis) (HCC) 09/03/2023   Dysphagia 07/23/2019   Encephalopathy acute 02/02/2023   Family history of breast cancer    Family history of lung cancer    Family history of prostate cancer    Gastroenteritis 07/10/2007   Genetic testing 07/25/2018   [employer] + RNA Insight was ordered.  Genes Analyzed (43 total): APC*, ATM*, AXIN2, BARD1, BMPR1A, BRCA1*, BRCA2*, BRIP1*, CDH1*, CDK4, CDKN2A, CHEK2*, DICER1, GALNT12, HOXB13, MEN1, MLH1*, MRE11A, MSH2*, MSH3, MSH6*, MUTYH*, NBN, NF1*, NTHL1, PALB2*, PMS2*, POLD1, POLE, PTEN*, RAD50, RAD51C*, RAD51D*, RET, SDHB, SDHD, SMAD4, SMARCA4, STK11 and TP53* (sequencing and deletion/duplication); EGFR (s   GERD 07/24/2006   GRAVE'S DISEASE 01/01/2008   History of hidradenitis suppurativa    History of kidney stones    History of thrush    HIV DISEASE 07/24/2006   dx March 05   Hyperlipidemia    HYPERTENSION 07/24/2006   Hyperthyroidism 08/2006   Grave's Disease -diffuse radiotracer uptake 08/25/06 Thyroid  scan-Cold nodule to R lower lobe of thyrorid   Ileus (HCC) 02/14/2023   Left bundle branch block (LBBB)    Malnutrition of moderate degree 02/08/2023   Menometrorrhagia    hx of   Nephrolithiasis    Panniculitis 05/12/2020   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 Completed 2 yrs Prednisone  after lung bx confirmation. No symptoms since then.    Sebaceous cyst 04/21/2020   Suppurative hidradenitis    Thyroid  cancer (HCC)    THYROID  NODULE, RIGHT 02/08/2007   Past Surgical History:  Procedure Laterality Date   AORTIC ARCH DEBRANCHING N/A 08/25/2023   Procedure: AORTIC ARCH DEBRANCHING USING 12X8X8MM HEMASHIELD GRAFT;  Surgeon: Lucas Dorise POUR, MD;  Location: [facility];  Service: Open Heart Surgery;  Laterality: N/A;  with bypasses to the innominate and left common carotid arteries   APPLICATION OF WOUND VAC N/A 01/20/2021   Procedure: APPLICATION OF WOUND VAC;  Surgeon: Lowery Estefana RAMAN, DO;  Location:  SURGERY CENTER;  Service: Plastics;  Laterality: N/A;   BREAST EXCISIONAL BIOPSY Right 04/26/2018   right axilla negative   BREAST EXCISIONAL BIOPSY Left 04/26/2018   left axilla negative   BREAST LUMPECTOMY Right 10/03/2018   malignant   BREAST LUMPECTOMY WITH RADIOACTIVE SEED AND SENTINEL LYMPH NODE BIOPSY Right 10/03/2018   Procedure: RIGHT BREAST LUMPECTOMY WITH RADIOACTIVE SEED AND SENTINEL LYMPH NODE MAPPING;  Surgeon: Vanderbilt Ned, MD;  Location: [facility];  Service: General;  Laterality: Right;   BREAST SURGERY  1997   Breast Reduction    CAPD INSERTION N/A 12/21/2023   Procedure: LAPAROSCOPIC INSERTION CONTINUOUS AMBULATORY PERITONEAL DIALYSIS  (CAPD) CATHETER; LAPAROSCOPIC OMENTUMPEXY;  Surgeon: Magda Ned SAILOR, MD;  Location: [facility];  Service: Vascular;  Laterality: N/A;   CYSTOSCOPY W/ URETERAL STENT REMOVAL  11/09/2012   Procedure: CYSTOSCOPY WITH STENT REMOVAL;  Surgeon: Ricardo Likens, MD;  Location: [facility];  Service: Urology;  Laterality: Right;   CYSTOSCOPY WITH RETROGRADE PYELOGRAM, URETEROSCOPY AND STENT PLACEMENT  11/09/2012   Procedure: CYSTOSCOPY WITH RETROGRADE PYELOGRAM, URETEROSCOPY AND STENT PLACEMENT;  Surgeon: Ricardo Likens, MD;  Location: [facility];  Service: Urology;  Laterality: Left;  LEFT URETEROSCOPY, STONE MANIPULATION, left STENT exchange    CYSTOSCOPY WITH STENT PLACEMENT  10/02/2012    Procedure: CYSTOSCOPY WITH STENT PLACEMENT;  Surgeon: Ricardo Likens, MD;  Location: [facility];  Service: Urology;  Laterality: Left;   DEBRIDEMENT AND CLOSURE WOUND N/A 01/20/2021   Procedure: Excision of abdominal wound with closure;  Surgeon: Lowery Estefana RAMAN, DO;  Location:  SURGERY CENTER;  Service: Plastics;  Laterality: N/A;   DILATION AND CURETTAGE OF UTERUS  11/2002   s/p for 1st trimester nonviable pregnancy   ESOPHAGOGASTRODUODENOSCOPY N/A 09/11/2024   Procedure: EGD (ESOPHAGOGASTRODUODENOSCOPY);  Surgeon: Abran Norleen SAILOR, MD;  Location: [facility] ENDOSCOPY;  Service: Gastroenterology;  Laterality: N/A;   EYE SURGERY     sty under eyelid   INCISE AND DRAIN ABCESS  08/2002   s/p I &D for righ inframmary fold hidradenitis   INCISION AND DRAINAGE PERITONSILLAR ABSCESS  12/2001   IR CV LINE INJECTION  06/07/2018   IR FLUORO GUIDE CV LINE RIGHT  01/30/2023   IR IMAGING GUIDED PORT INSERTION  06/20/2018   IR REMOVAL TUN ACCESS W/ PORT W/O FL MOD SED  06/20/2018   IR US  GUIDE VASC ACCESS RIGHT  01/30/2023   IRRIGATION AND DEBRIDEMENT ABSCESS  01/31/2012   Procedure: IRRIGATION AND DEBRIDEMENT ABSCESS;  Surgeon: Alm VEAR Angle, MD;  Location: [facility];  Service: General;  Laterality: Right;  right breast and axilla    LAPAROSCOPIC REPOSITIONING CAPD CATHETER Left 01/10/2024   Procedure: Removal of peritoneal dialysis Catheter.;  Surgeon: Magda Ned SAILOR, MD;  Location: [facility] OR;  Service: Vascular;  Laterality: Left;   LAPAROSCOPY  N/A 01/10/2024   Procedure: LAPAROSCOPY, DIAGNOSTIC;  Surgeon: Magda Debby SAILOR, MD;  Location[facility];  Service: Vascular;  Laterality: N/A;   NEPHROLITHOTOMY  10/02/2012   Procedure: NEPHROLITHOTOMY PERCUTANEOUS;  Surgeon: Ricardo Likens, MD;  Location: [facility];  Service: Urology;  Laterality: Right;  First Stage Percutaneous Nephrolithotomy with Surgeon Access, Left Ureteral Stent     NEPHROLITHOTOMY  10/04/2012   Procedure: NEPHROLITHOTOMY PERCUTANEOUS SECOND  LOOK;  Surgeon: Ricardo Likens, MD;  [facility]: [facility];  Service: Urology;  Laterality: Right;      NEPHROLITHOTOMY  10/08/2012   Procedure: NEPHROLITHOTOMY PERCUTANEOUS;  Surgeon: Ricardo Likens, MD;  Location: [facility];  Service: Urology;  Laterality: Right;  THIRD STAGE, nephrostomy tube exchange x 2   NEPHROLITHOTOMY  10/11/2012   Procedure: NEPHROLITHOTOMY PERCUTANEOUS SECOND LOOK;  Surgeon: Ricardo Likens, MD;  Location: [facility];  Service: Urology;  Laterality: Right;  RIGHT 4 STAGE PERCUTANOUS NEPHROLITHOTOMY, right URETEROSCOPY WITH HOLMIUM LASER    PANNICULECTOMY N/A 12/21/2020   Procedure: PANNICULECTOMY;  Surgeon: Lowery Estefana RAMAN, DO;  Location: [facility];  Service: Plastics;  Laterality: N/A;   PERCUTANEOUS NEPHROSTOLITHOTOMY  04/2022   PORT-A-CATH REMOVAL N/A 07/16/2020   Procedure: REMOVAL PORT-A-CATH;  Surgeon: Vanderbilt Debby, MD;  Location: [facility];  Service: General;  Laterality: N/A;   PORTACATH PLACEMENT Left 05/17/2018   Procedure: INSERTION PORT-A-CATH;  Surgeon: Vernetta Berg, MD;  Location: [facility];  Service: General;  Laterality: Left;   RADICAL NECK DISSECTION  04/12/2017   limited/notes 04/12/2017   RADICAL NECK DISSECTION N/A 04/12/2017   Procedure: RADICAL NECK DISSECTION;  Surgeon: Carlie Clark, MD;  Location: [facility] OR;  Service: ENT;  Laterality: N/A;  limited neck dissection 2 hours total   REDUCTION MAMMAPLASTY Bilateral 1998   RIGHT/LEFT HEART CATH AND CORONARY ANGIOGRAPHY N/A 03/12/2020   Procedure: RIGHT/LEFT HEART CATH AND CORONARY ANGIOGRAPHY;  Surgeon: Cherrie Toribio SAUNDERS, MD;  [facility];  Service: Cardiovascular;  Laterality: N/A;   [place]  1994   TEE WITHOUT CARDIOVERSION N/A 08/25/2023   Procedure: TRANSESOPHAGEAL ECHOCARDIOGRAM;  Surgeon: Lucas Dorise POUR, MD;  Location: [facility];  Service: Open Heart Surgery;  Laterality: N/A;   TENCKHOFF CATHETER INSERTION Left 01/10/2024   Procedure: INSERTION,  CATHETER, DIALYSIS, PERITONEAL;  Surgeon: Magda Debby SAILOR, MD;  Location: [facility];  Service: Vascular;  Laterality: Left;   THORACIC AORTIC ENDOVASCULAR STENT GRAFT N/A 02/02/2023   Procedure: THORACIC AORTIC ENDOVASCULAR STENT GRAFT;  Surgeon: Lanis Fonda BRAVO, MD;  Location: [facility] OR;  Service: Vascular;  Laterality: N/A;   THORACIC AORTIC ENDOVASCULAR STENT GRAFT N/A 08/25/2023   Procedure: THORACIC AORTIC ENDOVASCULAR STENT GRAFT;  Surgeon: Lanis Fonda BRAVO, MD;  Location: [facility] OR;  Service: Vascular;  Laterality: N/A;   THYROIDECTOMY  04/12/2017   completion/notes 04/12/2017   THYROIDECTOMY N/A 04/12/2017   Procedure: THYROIDECTOMY;  Surgeon: Carlie Clark, MD;  Location: [facility] OR;  Service: ENT;  Laterality: N/A;  Completion Thyroidectomy   TOTAL THYROIDECTOMY  2010   ULTRASOUND GUIDANCE FOR VASCULAR ACCESS Right 08/25/2023   Procedure: ULTRASOUND GUIDANCE FOR VASCULAR ACCESS, RIGHT FEMORAL ARTERY;  Surgeon: Lanis Fonda BRAVO, MD;  Location: [facility] OR;  Service: Vascular;  Laterality: Right;   Family History  Problem Relation Age of Onset   Hypertension Mother    Cancer Mother        laryngeal   Heart disease Mother        stent   Pancreatic cancer Father    Hypertension Father  Lung cancer Father 49       hx smoking   Hypertension Sister    Hypertension Sister    Hypertension Brother    Breast cancer Maternal Aunt 35   Breast cancer Maternal Aunt        dx 60+   Cancer Maternal Uncle        Lung CA   Breast cancer Paternal Aunt 39   Breast cancer Paternal Aunt        dx 53's   Breast cancer Paternal Aunt        dx 50's   Prostate cancer Paternal Uncle    Prostate cancer Paternal Uncle    Lung cancer Paternal Uncle    Breast cancer Cousin 108   Breast cancer Cousin        dx <50   Breast cancer Cousin        dx <50   Breast cancer Cousin        dx <50   Heart disease Other    Hypertension Other    Stroke Other        Grandparent   Kidney disease Other        Grandparent    Diabetes Other        FH of Diabetes   Colon cancer Neg Hx    Esophageal cancer Neg Hx    Rectal cancer Neg Hx    Stomach cancer Neg Hx    Social History:  reports that she quit smoking about 6 years ago. Her smoking use included cigarettes. She started smoking about 7 years ago. She has a 7.5 pack-year smoking history. She has never used smokeless tobacco. She reports that she does not currently use alcohol . She reports that she does not use drugs. Allergies: Allergies[1] Medications Prior to Admission  Medication Sig Dispense Refill   acetaminophen  (TYLENOL ) 500 MG tablet Take 1,000 mg by mouth every 6 (six) hours as needed for mild pain.     allopurinol  (ZYLOPRIM ) 100 MG tablet TAKE 1 TABLET (100 MG TOTAL) BY MOUTH DAILY. FOR GOUT PREVENTION 90 tablet 2   aspirin  81 MG chewable tablet Chew 81 mg by mouth daily.     AURYXIA  1 GM 210 MG(Fe) tablet Take 210 mg by mouth 3 (three) times daily with meals.     busPIRone  (BUSPAR ) 5 MG tablet TAKE 1 TABLET BY MOUTH TWICE A DAY FOR ANXIETY 180 tablet 0   dolutegravir  ([name] ) 50 MG tablet Take 1 tablet (50 mg total) by mouth daily. 30 tablet 11   emtricitabine -tenofovir  AF (DESCOVY ) 200-25 MG tablet Take 1 tablet by mouth daily. 30 tablet 11   fluticasone  (FLOVENT  HFA) 110 MCG/ACT inhaler Inhale 1 puff into the lungs 2 (two) times daily.     levocetirizine (XYZAL ) 5 MG tablet TAKE 1 TABLET BY MOUTH EVERY DAY IN THE EVENING FOR ALLERGIES 90 tablet 1   levothyroxine  (SYNTHROID ) 150 MCG tablet TAKE 1 TABLET BY MOUTH EVERY DAY BEFORE BREAKFAST 30 tablet 0   lidocaine  (LIDODERM ) 5 % PLACE 1 PATCH ONTO THE SKIN DAILY. REMOVE & DISCARD PATCH WITHIN 12 HOURS OR AS DIRECTED BY MD (Patient taking differently: Place 1 patch onto the skin daily as needed. Remove & Discard patch within 12 hours or as directed by MD) 30 patch 0   minocycline  (MINOCIN ) 100 MG capsule Take 1 capsule (100 mg total) by mouth 2 (two) times daily. 60 capsule 5   omeprazole  (PRILOSEC)  40 MG capsule Take 1 capsule (40 mg  total) by mouth 2 (two) times daily. for heartburn. 90 capsule 2   ondansetron  (ZOFRAN -ODT) 4 MG disintegrating tablet Take 1 tablet (4 mg total) by mouth every 8 (eight) hours as needed for nausea or vomiting. 20 tablet 0   rosuvastatin  (CRESTOR ) 10 MG tablet TAKE 1 TABLET BY MOUTH EVERY DAY FOR CHOLESTEROL 90 tablet 2   sevelamer  carbonate (RENVELA ) 800 MG tablet Take 800 mg by mouth 3 (three) times daily with meals.     tiZANidine  (ZANAFLEX ) 4 MG tablet Take 1-2 tablets (4-8 mg total) by mouth every 8 (eight) hours as needed for muscle spasms. 90 tablet 0   calcitRIOL  (ROCALTROL ) 0.25 MCG capsule Take 0.25 mcg by mouth 3 (three) times a week. (Patient not taking: Reported on 09/23/2024)     Lancets (ONETOUCH ULTRASOFT) lancets Use as instructed to test blood sugar daily 100 each 5     Blood pressure 91/60, pulse 81, temperature 98 F (36.7 C), temperature source Oral, resp. rate 10, weight 83.4 kg, last menstrual period 03/31/2014, SpO2 96%. Physical Exam Constitutional:      General: She is not in acute distress.    Appearance: She is obese. She is ill-appearing.  HENT:     Head: Normocephalic and atraumatic.     Right Ear: External ear normal.     Left Ear: External ear normal.     Nose: Nose normal.     Mouth/Throat:     Mouth: Mucous membranes are moist.     Pharynx: Oropharynx is clear.  Eyes:     Extraocular Movements: Extraocular movements intact.     Conjunctiva/sclera: Conjunctivae normal.  Cardiovascular:     Rate and Rhythm: Normal rate and regular rhythm.     Heart sounds: No murmur heard. Pulmonary:     Effort: Pulmonary effort is normal.  Abdominal:     Tenderness: There is abdominal tenderness.     Comments: PD site  Musculoskeletal:        General: Swelling and tenderness present.     Cervical back: Normal range of motion.  Skin:    General: Skin is warm.  Neurological:     Comments: Pt fairly alert. Oriented to person,  place, reason. Provided me biographical information. Normal language, quiet voice. CN exam non-focal. MMT: BUE 4- prox to 4/5 distally. BLE 3+ HF, KE and 4/5 ADF/PF. Stocking glove sensory loss in either foot up to ankle and in the bilateral hands up to wrist. DTR's 1+. No abnl resting tone.   Psychiatric:     Comments: Flat but cooperative.     Results for orders placed or performed during the hospital encounter of 09/23/24 (from the past 24 hours)  Glucose, capillary     Status: Abnormal   Collection Time: 09/25/24 11:30 AM  Result Value Ref Range   Glucose-Capillary 117 (H) 70 - 99 mg/dL  Glucose, capillary     Status: Abnormal   Collection Time: 09/25/24  3:29 PM  Result Value Ref Range   Glucose-Capillary 222 (H) 70 - 99 mg/dL  Glucose, capillary     Status: Abnormal   Collection Time: 09/25/24  7:34 PM  Result Value Ref Range   Glucose-Capillary 103 (H) 70 - 99 mg/dL  Glucose, capillary     Status: Abnormal   Collection Time: 09/25/24 11:31 PM  Result Value Ref Range   Glucose-Capillary 143 (H) 70 - 99 mg/dL  Glucose, capillary     Status: Abnormal   Collection Time: 09/26/24  3:36 AM  Result Value Ref Range   Glucose-Capillary 206 (H) 70 - 99 mg/dL  CBC     Status: Abnormal   Collection Time: 09/26/24  5:32 AM  Result Value Ref Range   WBC 16.1 (H) 4.0 - 10.5 K/uL   RBC 2.65 (L) 3.87 - 5.11 MIL/uL   Hemoglobin 9.8 (L) 12.0 - 15.0 g/dL   HCT 71.1 (L) 63.9 - 53.9 %   MCV 108.7 (H) 80.0 - 100.0 fL   MCH 37.0 (H) 26.0 - 34.0 pg   MCHC 34.0 30.0 - 36.0 g/dL   RDW 84.7 88.4 - 84.4 %   Platelets 226 150 - 400 K/uL   nRBC 0.7 (H) 0.0 - 0.2 %  Basic metabolic panel with GFR     Status: Abnormal   Collection Time: 09/26/24  5:32 AM  Result Value Ref Range   Sodium 130 (L) 135 - 145 mmol/L   Potassium 3.8 3.5 - 5.1 mmol/L   Chloride 94 (L) 98 - 111 mmol/L   CO2 22 22 - 32 mmol/L   Glucose, Bld 166 (H) 70 - 99 mg/dL   BUN 37 (H) 6 - 20 mg/dL   Creatinine, Ser 89.37 (H)  0.44 - 1.00 mg/dL   Calcium  8.6 (L) 8.9 - 10.3 mg/dL   GFR, Estimated 4 (L) >60 mL/min   Anion gap 14 5 - 15  Magnesium      Status: None   Collection Time: 09/26/24  5:32 AM  Result Value Ref Range   Magnesium  2.3 1.7 - 2.4 mg/dL  Phosphorus     Status: None   Collection Time: 09/26/24  5:32 AM  Result Value Ref Range   Phosphorus 4.3 2.5 - 4.6 mg/dL  Glucose, capillary     Status: None   Collection Time: 09/26/24  7:55 AM  Result Value Ref Range   Glucose-Capillary 80 70 - 99 mg/dL   *Note: Due to a large number of results and/or encounters for the requested time period, some results have not been displayed. A complete set of results can be found in Results Review.   US  Abdomen Limited RUQ (LIVER/GB) Result Date: 09/24/2024 EXAM: Right Upper Quadrant Abdominal Ultrasound 09/24/2024 03:50:43 PM TECHNIQUE: Real-time ultrasonography of the right upper quadrant of the abdomen was performed. COMPARISON: None available. CLINICAL HISTORY: Abdominal pain FINDINGS: LIVER: The liver demonstrates normal echogenicity. No intrahepatic biliary ductal dilatation. No evidence of mass. BILIARY SYSTEM: No pericholecystic fluid. Cholesterolosis of the gallbladder wall, suggesting Adenomyomatosis. Layering 16 mm gallstone. Common bile duct is within normal limits measuring 3 mm. OTHER: No right upper quadrant ascites. IMPRESSION: 1. Cholelithiasis without sonographic findings to suggest acute cholecystitis. 2. Gallbladder adenomyomatosis, benign. Electronically signed by: Pinkie Pebbles MD 09/24/2024 07:22 PM EST RP Workstation: [id number]   VAS US  LOWER EXTREMITY VENOUS (DVT) Result Date: 09/24/2024  Lower Venous DVT Study Patient Name:  [name] Fairbanks  Date of Exam:   09/24/2024 Medical Rec #: [id number]              Accession #:    [id number] Date of Birth: April 05, 1966              Patient Gender: F Patient Age:   76 years Exam Location:  [facility] Procedure:      VAS US  LOWER EXTREMITY VENOUS  (DVT) Referring Phys: TORIBIO SHARPS --------------------------------------------------------------------------------  Indications: Edema. Other Indications: Anemia, H/O thyroid  cancer. Comparison Study: No prior exam. Performing Technologist: Edilia Elden Appl  Examination Guidelines: A complete evaluation includes B-mode  imaging, spectral Doppler, color Doppler, and power Doppler as needed of all accessible portions of each vessel. Bilateral testing is considered an integral part of a complete examination. Limited examinations for reoccurring indications may be performed as noted. The reflux portion of the exam is performed with the patient in reverse Trendelenburg.  +---------+---------------+---------+-----------+----------+--------------+ RIGHT    CompressibilityPhasicitySpontaneityPropertiesThrombus Aging +---------+---------------+---------+-----------+----------+--------------+ CFV      Full           Yes      Yes                                 +---------+---------------+---------+-----------+----------+--------------+ SFJ      Full           Yes      Yes                                 +---------+---------------+---------+-----------+----------+--------------+ FV Prox  Full                                                        +---------+---------------+---------+-----------+----------+--------------+ FV Mid   Full                                                        +---------+---------------+---------+-----------+----------+--------------+ FV DistalFull                                                        +---------+---------------+---------+-----------+----------+--------------+ PFV      Full                                                        +---------+---------------+---------+-----------+----------+--------------+ POP      Full           Yes      Yes                                  +---------+---------------+---------+-----------+----------+--------------+ PTV      Full                                                        +---------+---------------+---------+-----------+----------+--------------+ PERO     Full                                                        +---------+---------------+---------+-----------+----------+--------------+   +---------+---------------+---------+-----------+----------+--------------+  LEFT     CompressibilityPhasicitySpontaneityPropertiesThrombus Aging +---------+---------------+---------+-----------+----------+--------------+ CFV      Full           Yes      Yes                                 +---------+---------------+---------+-----------+----------+--------------+ SFJ      Full           Yes      Yes                                 +---------+---------------+---------+-----------+----------+--------------+ FV Prox  Full                                                        +---------+---------------+---------+-----------+----------+--------------+ FV Mid   Full                                                        +---------+---------------+---------+-----------+----------+--------------+ FV DistalFull                                                        +---------+---------------+---------+-----------+----------+--------------+ PFV      Full                                                        +---------+---------------+---------+-----------+----------+--------------+ POP      Full           Yes      Yes                                 +---------+---------------+---------+-----------+----------+--------------+ PTV      Full                                                        +---------+---------------+---------+-----------+----------+--------------+ PERO     Full                                                         +---------+---------------+---------+-----------+----------+--------------+     Summary: BILATERAL: - No evidence of deep vein thrombosis seen in the lower extremities, bilaterally. -No evidence of popliteal cyst, bilaterally.   *See table(s) above for measurements and observations. Electronically signed by Norman Serve on 09/24/2024 at 3:43:33 PM.    Final    ECHOCARDIOGRAM COMPLETE  Result Date: 09/24/2024    ECHOCARDIOGRAM REPORT   Patient Name:   [name] Date of Exam: 09/24/2024 Medical Rec #:  [id number]             Height:       65.0 in Accession #:    [id number]            Weight:       176.6 lb Date of Birth:  04-Mar-1966             BSA:          1.876 m Patient Age:    58 years              BP:           128/58 mmHg Patient Gender: F                     HR:           68 bpm. Exam Location:  Inpatient Procedure: 2D Echo, Cardiac Doppler, Color Doppler and Intracardiac            Opacification Agent (Both Spectral and Color Flow Doppler were            utilized during procedure). Indications:    Shock  History:        Patient has prior history of Echocardiogram examinations, most                 recent 01/30/2023. Risk Factors:Hypertension, Diabetes, Former                 Smoker and Dyslipidemia.  Sonographer:    Juliene Rucks Referring Phys: [id number] TORIBIO JAYSON SHARPS  Sonographer Comments: Patient is obese. IMPRESSIONS  1. Left ventricular ejection fraction, by estimation, is 30 to 35%. The left ventricle has moderately decreased function. The left ventricle demonstrates regional wall motion abnormalities (see scoring diagram/findings for description). There is mild concentric left ventricular hypertrophy. Left ventricular diastolic parameters are consistent with Grade II diastolic dysfunction (pseudonormalization). Elevated left ventricular end-diastolic pressure.  2. Right ventricular systolic function is normal. The right ventricular size is normal.  3. Left atrial size was severely dilated.  4.  The mitral valve is normal in structure. Moderate to severe mitral valve regurgitation. No evidence of mitral stenosis.  5. The aortic valve is normal in structure. Aortic valve regurgitation is not visualized. No aortic stenosis is present.  6. The inferior vena cava is normal in size with greater than 50% respiratory variability, suggesting right atrial pressure of 3 mmHg. FINDINGS  Left Ventricle: Left ventricular ejection fraction, by estimation, is 30 to 35%. The left ventricle has moderately decreased function. The left ventricle demonstrates regional wall motion abnormalities. Definity  contrast agent was given IV to delineate the left ventricular endocardial borders. The left ventricular internal cavity size was normal in size. There is mild concentric left ventricular hypertrophy. Left ventricular diastolic parameters are consistent with Grade II diastolic dysfunction (pseudonormalization). Elevated left ventricular end-diastolic pressure.  LV Wall Scoring: The entire apex is akinetic. The anterior wall, antero-lateral wall, anterior septum, inferior wall, posterior wall, mid inferoseptal segment, and basal inferoseptal segment are hypokinetic. Right Ventricle: The right ventricular size is normal. No increase in right ventricular wall thickness. Right ventricular systolic function is normal. Left Atrium: Left atrial size was severely dilated. Right Atrium: Right atrial size was normal in size. Pericardium: Trivial pericardial effusion is present. The pericardial effusion is circumferential. Mitral Valve: The  mitral valve is normal in structure. Moderate to severe mitral valve regurgitation. No evidence of mitral valve stenosis. Tricuspid Valve: The tricuspid valve is normal in structure. Tricuspid valve regurgitation is not demonstrated. No evidence of tricuspid stenosis. Aortic Valve: The aortic valve is normal in structure. Aortic valve regurgitation is not visualized. No aortic stenosis is present.  Pulmonic Valve: The pulmonic valve was normal in structure. Pulmonic valve regurgitation is not visualized. No evidence of pulmonic stenosis. Aorta: The aortic root is normal in size and structure. Venous: The inferior vena cava is normal in size with greater than 50% respiratory variability, suggesting right atrial pressure of 3 mmHg. IAS/Shunts: No atrial level shunt detected by color flow Doppler.  LEFT VENTRICLE PLAX 2D LVIDd:         5.20 cm      Diastology LVIDs:         3.80 cm      LV e' medial:    4.90 cm/s LV PW:         1.10 cm      LV E/e' medial:  22.7 LV IVS:        1.00 cm      LV e' lateral:   7.72 cm/s LVOT diam:     2.10 cm      LV E/e' lateral: 14.4 LV SV:         76 LV SV Index:   40 LVOT Area:     3.46 cm  LV Volumes (MOD) LV vol d, MOD A2C: 222.0 ml LV vol d, MOD A4C: 268.0 ml LV vol s, MOD A2C: 158.0 ml LV vol s, MOD A4C: 197.0 ml LV SV MOD A2C:     64.0 ml LV SV MOD A4C:     268.0 ml LV SV MOD BP:      68.3 ml RIGHT VENTRICLE             IVC RV Basal diam:  3.60 cm     IVC diam: 1.80 cm RV Mid diam:    2.50 cm RV S prime:     11.00 cm/s TAPSE (M-mode): 1.7 cm LEFT ATRIUM             Index        RIGHT ATRIUM           Index LA diam:        4.10 cm 2.19 cm/m   RA Area:     13.30 cm LA Vol (A2C):   78.1 ml 41.63 ml/m  RA Volume:   29.40 ml  15.67 ml/m LA Vol (A4C):   95.1 ml 50.69 ml/m LA Biplane Vol: 91.2 ml 48.61 ml/m  AORTIC VALVE LVOT Vmax:   107.00 cm/s LVOT Vmean:  69.800 cm/s LVOT VTI:    0.219 m  AORTA Ao Root diam: 2.90 cm MITRAL VALVE                TRICUSPID VALVE MV Area (PHT): 3.77 cm     TR Peak grad:   13.7 mmHg MV Decel Time: 201 msec     TR Vmax:        185.00 cm/s MR Peak grad: 115.8 mmHg MR Vmax:      538.00 cm/s   SHUNTS MV E velocity: 111.00 cm/s  Systemic VTI:  0.22 m MV A velocity: 101.00 cm/s  Systemic Diam: 2.10 cm MV E/A ratio:  1.10 Kardie Tobb DO Electronically signed by Dub Huntsman DO Signature Date/Time: 09/24/2024/1:41:17 PM  Final      Assessment/Plan: Diagnosis: 58 year old female with recent pituitary hemorrhage/apoplexy wth a recent decline in strength and balance likely related to suspected adrenal efficiency, circulatory shock, NSTEMI, and severe MR Does the need for close, 24 hr/day medical supervision in concert with the patient's rehab needs make it unreasonable for this patient to be served in a less intensive setting? Yes Co-Morbidities requiring supervision/potential complications:  -ESRD on peritoneal dialysis -HIV -peripheral neuropathy Due to bladder management, bowel management, safety, skin/wound care, disease management, medication administration, pain management, and patient education, does the patient require 24 hr/day rehab nursing? Yes Does the patient require coordinated care of a physician, rehab nurse, therapy disciplines of PT, oT to address physical and functional deficits in the context of the above medical diagnosis(es)? Yes Addressing deficits in the following areas: balance, endurance, locomotion, strength, transferring, bowel/bladder control, bathing, dressing, feeding, grooming, toileting, and psychosocial support Can the patient actively participate in an intensive therapy program of at least 3 hrs of therapy per day at least 5 days per week? Yes The potential for patient to make measurable gains while on inpatient rehab is excellent Anticipated functional outcomes upon discharge from inpatient rehab are modified independent  with PT, modified independent with OT, n/a with SLP. Estimated rehab length of stay to reach the above functional goals is: 7-10 days Anticipated discharge destination: Home Overall Rehab/Functional Prognosis: excellent  POST ACUTE RECOMMENDATIONS: This patient's condition is appropriate for continued rehabilitative care in the following setting: CIR Patient has agreed to participate in recommended program. Yes Note that insurance prior authorization may be required  for reimbursement for recommended care.  Comment: Pt was independent without a device prior to recent decline over the last 10+ days prior to this admit. She has local family which can provide intermittent assistance. Home is accessible, and she can stay on the first floor of her townhouse if needed. Rehab Admissions Coordinator to follow up.      I have personally performed a face to face diagnostic evaluation of this patient. Additionally, I have examined the patient's medical record including any pertinent labs and radiographic images.    Thanks,  Arthea ONEIDA Gunther, MD 09/26/2024     [1]  Allergies Allergen Reactions   Genvoya [Elviteg-Cobic-Emtricit-Tenofaf] Hives   Lisinopril Cough   Aldactone  [Spironolactone ] Hives   Valium  [Diazepam ] Other (See Comments)    Lethargy (pt states she cannot take this again)   Tegaderm Ag Mesh [Silver] Itching

## 2024-09-26 NOTE — Progress Notes (Signed)
 Pt back on floor from cath lab. TR band in place to Rgith radial with 13cc of air. Reverse Barbeau performed. No hematoma present. Pt in no pain at this time. Right brachial site clean/dry/intact. No hematoma present. Pt alert and oriented X4 on arrival, but drowsy. Pt's mom at bedside.

## 2024-09-26 NOTE — H&P (View-Only) (Signed)
 Progress Note  Patient Name: [name] Date of Encounter: 09/26/2024  Primary Cardiologist: Felicia FORBES Calender, DO   Subjective   Doing well today. Was able to get some sleep. Denies chest pain, shortness of breath or any other complaints.   Inpatient Medications    Scheduled Meds:  allopurinol   100 mg Oral Daily   aspirin   81 mg Oral Daily   aspirin   81 mg Oral Pre-Cath   budesonide  (PULMICORT ) nebulizer solution  0.25 mg Nebulization BID   calcitRIOL   0.25 mcg Oral Once per day on Monday Wednesday Friday   Chlorhexidine  Gluconate Cloth  6 each Topical Daily   dolutegravir   50 mg Oral Daily   emtricitabine -tenofovir  AF  1 tablet Oral Daily   feeding supplement  237 mL Oral BID BM   fludrocortisone   0.1 mg Oral Daily   gabapentin   100 mg Oral TID   gentamicin  cream  1 Application Topical Daily   heparin  injection (subcutaneous)  5,000 Units Subcutaneous Q8H   hydrocortisone  sod succinate (SOLU-CORTEF ) inj  50 mg Intravenous Q12H   insulin  aspart  0-9 Units Subcutaneous Q4H   levothyroxine   150 mcg Oral Q0600   lidocaine   1 patch Transdermal Q24H   loratadine   10 mg Oral QHS   midodrine   5 mg Oral TID WC   mirtazapine   7.5 mg Oral QHS   multivitamin  1 tablet Oral QHS   pantoprazole   40 mg Oral Daily   rosuvastatin   10 mg Oral Daily   Continuous Infusions:  dialysis solution 1.5% low-MG/low-CA     dialysis solution 2.5% low-MG/low-CA     PRN Meds: busPIRone , cetaphil, docusate sodium , heparin  sodium (porcine) 3,000 Units in dialysis solution 1.5% low-MG/low-CA 6,000 mL dialysis solution, heparin  sodium (porcine) 3,000 Units in dialysis solution 2.5% low-MG/low-CA 6,000 mL dialysis solution, HYDROmorphone  (DILAUDID ) injection, ondansetron  (ZOFRAN ) IV, oxyCODONE , polyethylene glycol   Vital Signs    Vitals:   09/26/24 0500 09/26/24 0600 09/26/24 0700 09/26/24 0757  BP: 111/69 (!) 94/57 91/60   Pulse: 86 88 81   Resp: 11 11 10    Temp:    98 F (36.7 C)   TempSrc:    Oral  SpO2: 95% 95% 96%   Weight: 83.4 kg       Intake/Output Summary (Last 24 hours) at 09/26/2024 0825 Last data filed at 09/26/2024 0500 Gross per 24 hour  Intake 827.7 ml  Output --  Net 827.7 ml   Filed Weights   09/24/24 0500 09/25/24 0544 09/26/24 0500  Weight: 80.1 kg 85.6 kg 83.4 kg    Telemetry    NSR with PVCs - Personally Reviewed  ECG    NSR with LBBB and PVC - Personally Reviewed  Physical Exam   Physical Exam Vitals and nursing note reviewed.  Constitutional:      Appearance: Normal appearance.  HENT:     Head: Normocephalic and atraumatic.  Eyes:     Conjunctiva/sclera: Conjunctivae normal.  Cardiovascular:     Rate and Rhythm: Normal rate and regular rhythm.     Comments: Fixed splitting Pulmonary:     Effort: Pulmonary effort is normal.     Breath sounds: Normal breath sounds.  Musculoskeletal:        General: No swelling or tenderness.  Skin:    Coloration: Skin is not jaundiced or pale.  Neurological:     Mental Status: She is alert.      Labs    Chemistry Recent Labs  Lab 09/23/24  1431 09/24/24 0354 09/24/24 1731 09/25/24 0537 09/26/24 0532  NA 141 135  --  130* 130*  K 2.8* 2.9* 3.7 3.4* 3.8  CL 98 98  --  93* 94*  CO2 25 17*  --  20* 22  GLUCOSE 101* 136*  --  117* 166*  BUN 39* 35*  --  33* 37*  CREATININE 14.09* 11.85*  --  11.08* 10.62*  CALCIUM  8.1* 8.3*  --  8.4* 8.6*  PROT 6.6  --   --   --   --   ALBUMIN  1.9*  --   --   --   --   AST 19  --   --   --   --   ALT 9  --   --   --   --   ALKPHOS 244*  --   --   --   --   BILITOT 1.0  --   --   --   --   GFRNONAA 3* 3*  --  4* 4*  ANIONGAP 18* 20*  --  17* 14     Hematology Recent Labs  Lab 09/24/24 0354 09/25/24 0537 09/26/24 0532  WBC 10.8* 11.7* 16.1*  RBC 2.52* 2.53* 2.65*  HGB 9.2* 9.1* 9.8*  HCT 28.0* 27.5* 28.8*  MCV 111.1* 108.7* 108.7*  MCH 36.5* 36.0* 37.0*  MCHC 32.9 33.1 34.0  RDW 15.1 14.8 15.2  PLT 186 180 226     Cardiac EnzymesNo results for input(s): TROPONINI in the last 168 hours. No results for input(s): TROPIPOC in the last 168 hours.   BNP Recent Labs  Lab 09/24/24 0927  BNP 1,614.8*     DDimer No results for input(s): DDIMER in the last 168 hours.   Radiology    US  Abdomen Limited RUQ (LIVER/GB) Result Date: 09/24/2024 EXAM: Right Upper Quadrant Abdominal Ultrasound 09/24/2024 03:50:43 PM TECHNIQUE: Real-time ultrasonography of the right upper quadrant of the abdomen was performed. COMPARISON: None available. CLINICAL HISTORY: Abdominal pain FINDINGS: LIVER: The liver demonstrates normal echogenicity. No intrahepatic biliary ductal dilatation. No evidence of mass. BILIARY SYSTEM: No pericholecystic fluid. Cholesterolosis of the gallbladder wall, suggesting Adenomyomatosis. Layering 16 mm gallstone. Common bile duct is within normal limits measuring 3 mm. OTHER: No right upper quadrant ascites. IMPRESSION: 1. Cholelithiasis without sonographic findings to suggest acute cholecystitis. 2. Gallbladder adenomyomatosis, benign. Electronically signed by: Pinkie Pebbles MD 09/24/2024 07:22 PM EST RP Workstation: [id number]   VAS US  LOWER EXTREMITY VENOUS (DVT) Result Date: 09/24/2024  Lower Venous DVT Study Patient Name:  [name] [facility]  Date of Exam:   09/24/2024 Medical Rec #: [id number]              Accession #:    [id number] Date of Birth: 07-10-1966              Patient Gender: F Patient Age:   58 years Exam Location:  [facility] Procedure:      VAS US  LOWER EXTREMITY VENOUS (DVT) Referring Phys: TORIBIO SHARPS --------------------------------------------------------------------------------  Indications: Edema. Other Indications: Anemia, H/O thyroid  cancer. Comparison Study: No prior exam. Performing Technologist: Edilia Elden Appl  Examination Guidelines: A complete evaluation includes B-mode imaging, spectral Doppler, color Doppler, and power Doppler as needed of all  accessible portions of each vessel. Bilateral testing is considered an integral part of a complete examination. Limited examinations for reoccurring indications may be performed as noted. The reflux portion of the exam is performed with the patient in reverse Trendelenburg.  +---------+---------------+---------+-----------+----------+--------------+  RIGHT    CompressibilityPhasicitySpontaneityPropertiesThrombus Aging +---------+---------------+---------+-----------+----------+--------------+ CFV      Full           Yes      Yes                                 +---------+---------------+---------+-----------+----------+--------------+ SFJ      Full           Yes      Yes                                 +---------+---------------+---------+-----------+----------+--------------+ FV Prox  Full                                                        +---------+---------------+---------+-----------+----------+--------------+ FV Mid   Full                                                        +---------+---------------+---------+-----------+----------+--------------+ FV DistalFull                                                        +---------+---------------+---------+-----------+----------+--------------+ PFV      Full                                                        +---------+---------------+---------+-----------+----------+--------------+ POP      Full           Yes      Yes                                 +---------+---------------+---------+-----------+----------+--------------+ PTV      Full                                                        +---------+---------------+---------+-----------+----------+--------------+ PERO     Full                                                        +---------+---------------+---------+-----------+----------+--------------+   +---------+---------------+---------+-----------+----------+--------------+  LEFT     CompressibilityPhasicitySpontaneityPropertiesThrombus Aging +---------+---------------+---------+-----------+----------+--------------+ CFV      Full           Yes      Yes                                 +---------+---------------+---------+-----------+----------+--------------+  SFJ      Full           Yes      Yes                                 +---------+---------------+---------+-----------+----------+--------------+ FV Prox  Full                                                        +---------+---------------+---------+-----------+----------+--------------+ FV Mid   Full                                                        +---------+---------------+---------+-----------+----------+--------------+ FV DistalFull                                                        +---------+---------------+---------+-----------+----------+--------------+ PFV      Full                                                        +---------+---------------+---------+-----------+----------+--------------+ POP      Full           Yes      Yes                                 +---------+---------------+---------+-----------+----------+--------------+ PTV      Full                                                        +---------+---------------+---------+-----------+----------+--------------+ PERO     Full                                                        +---------+---------------+---------+-----------+----------+--------------+     Summary: BILATERAL: - No evidence of deep vein thrombosis seen in the lower extremities, bilaterally. -No evidence of popliteal cyst, bilaterally.   *See table(s) above for measurements and observations. Electronically signed by Norman Serve on 09/24/2024 at 3:43:33 PM.    Final    ECHOCARDIOGRAM COMPLETE Result Date: 09/24/2024    ECHOCARDIOGRAM REPORT   Patient Name:   IRANIA DURELL [facility] Date of Exam: 09/24/2024  Medical Rec #:  [id number]             Height:       65.0 in Accession #:    [id number]            Weight:  176.6 lb Date of Birth:  02/19/1966             BSA:          1.876 m Patient Age:    58 years              BP:           128/58 mmHg Patient Gender: F                     HR:           68 bpm. Exam Location:  Inpatient Procedure: 2D Echo, Cardiac Doppler, Color Doppler and Intracardiac            Opacification Agent (Both Spectral and Color Flow Doppler were            utilized during procedure). Indications:    Shock  History:        Patient has prior history of Echocardiogram examinations, most                 recent 01/30/2023. Risk Factors:Hypertension, Diabetes, Former                 Smoker and Dyslipidemia.  Sonographer:    Juliene Rucks Referring Phys: [id number] TORIBIO JAYSON SHARPS  Sonographer Comments: Patient is obese. IMPRESSIONS  1. Left ventricular ejection fraction, by estimation, is 30 to 35%. The left ventricle has moderately decreased function. The left ventricle demonstrates regional wall motion abnormalities (see scoring diagram/findings for description). There is mild concentric left ventricular hypertrophy. Left ventricular diastolic parameters are consistent with Grade II diastolic dysfunction (pseudonormalization). Elevated left ventricular end-diastolic pressure.  2. Right ventricular systolic function is normal. The right ventricular size is normal.  3. Left atrial size was severely dilated.  4. The mitral valve is normal in structure. Moderate to severe mitral valve regurgitation. No evidence of mitral stenosis.  5. The aortic valve is normal in structure. Aortic valve regurgitation is not visualized. No aortic stenosis is present.  6. The inferior vena cava is normal in size with greater than 50% respiratory variability, suggesting right atrial pressure of 3 mmHg. FINDINGS  Left Ventricle: Left ventricular ejection fraction, by estimation, is 30 to 35%. The left ventricle has moderately  decreased function. The left ventricle demonstrates regional wall motion abnormalities. Definity  contrast agent was given IV to delineate the left ventricular endocardial borders. The left ventricular internal cavity size was normal in size. There is mild concentric left ventricular hypertrophy. Left ventricular diastolic parameters are consistent with Grade II diastolic dysfunction (pseudonormalization). Elevated left ventricular end-diastolic pressure.  LV Wall Scoring: The entire apex is akinetic. The anterior wall, antero-lateral wall, anterior septum, inferior wall, posterior wall, mid inferoseptal segment, and basal inferoseptal segment are hypokinetic. Right Ventricle: The right ventricular size is normal. No increase in right ventricular wall thickness. Right ventricular systolic function is normal. Left Atrium: Left atrial size was severely dilated. Right Atrium: Right atrial size was normal in size. Pericardium: Trivial pericardial effusion is present. The pericardial effusion is circumferential. Mitral Valve: The mitral valve is normal in structure. Moderate to severe mitral valve regurgitation. No evidence of mitral valve stenosis. Tricuspid Valve: The tricuspid valve is normal in structure. Tricuspid valve regurgitation is not demonstrated. No evidence of tricuspid stenosis. Aortic Valve: The aortic valve is normal in structure. Aortic valve regurgitation is not visualized. No aortic stenosis is present. Pulmonic Valve: The pulmonic valve was normal in structure. Pulmonic valve regurgitation is  not visualized. No evidence of pulmonic stenosis. Aorta: The aortic root is normal in size and structure. Venous: The inferior vena cava is normal in size with greater than 50% respiratory variability, suggesting right atrial pressure of 3 mmHg. IAS/Shunts: No atrial level shunt detected by color flow Doppler.  LEFT VENTRICLE PLAX 2D LVIDd:         5.20 cm      Diastology LVIDs:         3.80 cm      LV e' medial:     4.90 cm/s LV PW:         1.10 cm      LV E/e' medial:  22.7 LV IVS:        1.00 cm      LV e' lateral:   7.72 cm/s LVOT diam:     2.10 cm      LV E/e' lateral: 14.4 LV SV:         76 LV SV Index:   40 LVOT Area:     3.46 cm  LV Volumes (MOD) LV vol d, MOD A2C: 222.0 ml LV vol d, MOD A4C: 268.0 ml LV vol s, MOD A2C: 158.0 ml LV vol s, MOD A4C: 197.0 ml LV SV MOD A2C:     64.0 ml LV SV MOD A4C:     268.0 ml LV SV MOD BP:      68.3 ml RIGHT VENTRICLE             IVC RV Basal diam:  3.60 cm     IVC diam: 1.80 cm RV Mid diam:    2.50 cm RV S prime:     11.00 cm/s TAPSE (M-mode): 1.7 cm LEFT ATRIUM             Index        RIGHT ATRIUM           Index LA diam:        4.10 cm 2.19 cm/m   RA Area:     13.30 cm LA Vol (A2C):   78.1 ml 41.63 ml/m  RA Volume:   29.40 ml  15.67 ml/m LA Vol (A4C):   95.1 ml 50.69 ml/m LA Biplane Vol: 91.2 ml 48.61 ml/m  AORTIC VALVE LVOT Vmax:   107.00 cm/s LVOT Vmean:  69.800 cm/s LVOT VTI:    0.219 m  AORTA Ao Root diam: 2.90 cm MITRAL VALVE                TRICUSPID VALVE MV Area (PHT): 3.77 cm     TR Peak grad:   13.7 mmHg MV Decel Time: 201 msec     TR Vmax:        185.00 cm/s MR Peak grad: 115.8 mmHg MR Vmax:      538.00 cm/s   SHUNTS MV E velocity: 111.00 cm/s  Systemic VTI:  0.22 m MV A velocity: 101.00 cm/s  Systemic Diam: 2.10 cm MV E/A ratio:  1.10 Kardie Tobb DO Electronically signed by Dub Huntsman DO Signature Date/Time: 09/24/2024/1:41:17 PM    Final     Cardiac Studies   TTE 09/24/24:   1. Left ventricular ejection fraction, by estimation, is 30 to 35%. The  left ventricle has moderately decreased function. The left ventricle  demonstrates regional wall motion abnormalities (*see scoring  diagram/findings for description). There is mild  concentric left ventricular hypertrophy. Left ventricular diastolic  parameters are consistent with Grade II diastolic dysfunction  (pseudonormalization).  Elevated left ventricular end-diastolic pressure.   2. Right  ventricular systolic function is normal. The right ventricular  size is normal.   3. Left atrial size was severely dilated.   4. The mitral valve is normal in structure. Moderate to severe mitral  valve regurgitation. No evidence of mitral stenosis.   5. The aortic valve is normal in structure. Aortic valve regurgitation is  not visualized. No aortic stenosis is present.   6. The inferior vena cava is normal in size with greater than 50%  respiratory variability, suggesting right atrial pressure of 3 mmHg.   *LV Wall Scoring:  The entire apex is akinetic. The anterior wall, antero-lateral wall, anterior  septum, inferior wall, posterior wall, mid inferoseptal segment, and basal  inferoseptal segment are hypokinetic.   Patient Profile     This is a 58 year old female with past medical history of diabetes, coronary calcification on prior CT, ESRD on peritoneal dialysis, aortic dissection s/p repair, upper extremity DVT, OSA, HIV, hypertension, Graves' disease, breast cancer previously on chemotherapy (carboplatin , docetaxel , trastuzumab  and Pertuzumab  changed to trastuzumab  due to diarrhea), left bundle branch block who presented to the ED with worsening shortness of breath and malaise.  Cardiology was consulted for abnormal echocardiogram with newly reduced ejection fraction of 30 to 35%, wall motion abnormalities and moderate to severe MR.  She has not had any chest pain.   Assessment & Plan   Assessment & Plan:  New combined systolic and diastolic HF-echo shows ejection fraction of 30 to 35% with an akinetic apex and hypokinesis of remaining segments.  My personal review the anterior septum appears more hypokinetic than the remaining segments however the baseline LBBB makes this difficult to assess.  When compared to prior echoes in the setting of LBBB, however, she did not have such significant wall motion abnormalities in the septal area. Unable to start GDMT due to tenuous BP.  Given  elevated troponin and newly reduced EF with wall motion abnormalities, will plan on left and right heart catheterization today, though I suspect this is more likely stress cardiomyopathy. She is able to make a little urine now and on PD but understands that the contrast puts her at risk of becoming anuric.  Has LBBB with widened QRS but will need to be on medically optimized GDMT prior to CRT placement, if needed.  Volume management per nephrology LBBB Moderate to severe MR Circulatory shock, appears resolved - off pressors, on steroids. discussed with Dr. Meade who states patient is stable to proceed with cath. 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

## 2024-09-26 NOTE — Progress Notes (Addendum)
 [facility] Progress Note   Subjective:  Seen in room C/o tremors in her hands BP's 100s- 120s         Vitals:    09/25/24 1000 09/25/24 1100 09/25/24 1129 09/25/24 1200  BP: 131/64 (!) 87/57   124/65  Pulse: 73 70   73  Resp: 13 11   12   Temp:     97.9 F (36.6 C)    TempSrc:     Oral    SpO2: 96% 95%   98%  Weight:              Exam: Gen alert, no distress No jvd or bruits Chest clear bilat to bases RRR no MRG Abd soft ntnd no mass or ascites +bs,  MS deformity over the R hip, painful to touch Ext no LE or UE edema, no other edema Neuro is alert, Ox 3 , nf    PD cath intact   Home bp meds: None     OP PD: CCPD 7x week-[facility] 5 exchanges, fill vol 2250, 1.5hr dwell time, edw 74.5kg  Usually does 1/2 1.5% bags and 1/2 2.5% bags    CXR 12/08: no active disease   Assessment/ Plan: Hypotension: 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 BP's better.  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 Cardiology consulting, w/ sig WMA's they are planning RHC/ LHC. Appreciate assitance.  ESRD: on CCPD. Cont PD nightly while here.  Volume: no signs of vol overload, admit CXR was negative. Use all 1.5% w/ PD tonight.  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 Follow, transfuse prn.  H/o type B aortic dissection: in 01/2023 HIV Tremors: would dc gabapentin  and this is a cause of tremors/ asterixis in ESRD pts.      Myer Fret MD  [facility] 09/25/2024, 3:12 PM   Last Labs        Recent Labs  Lab 09/23/24 1431 09/24/24 0354 09/24/24 1731 09/25/24 0537  HGB 11.8* 9.2*  --  9.1*  ALBUMIN  1.9*  --   --   --   CALCIUM  8.1* 8.3*  --  8.4*  PHOS  --  3.9  --  3.7  CREATININE 14.09* 11.85*  --  11.08*  K 2.8* 2.9* 3.7 3.4*      Last Labs  No results for input(s): IRON , TIBC, FERRITIN in the last 168 hours.   Inpatient medications:  allopurinol   100 mg Oral  Daily   aspirin   81 mg Oral Daily   budesonide  (PULMICORT ) nebulizer solution  0.25 mg Nebulization BID   calcitRIOL   0.25 mcg Oral Once per day on Monday Wednesday Friday   Chlorhexidine  Gluconate Cloth  6 each Topical Daily   dolutegravir   50 mg Oral Daily   emtricitabine -tenofovir  AF  1 tablet Oral Daily   feeding supplement  237 mL Oral BID BM   fludrocortisone   0.1 mg Oral Daily   gabapentin   100 mg Oral TID   gentamicin  cream  1 Application Topical Daily   heparin  injection (subcutaneous)  5,000 Units Subcutaneous Q8H   hydrocortisone  sod succinate (SOLU-CORTEF ) inj  50 mg Intravenous Q12H   insulin  aspart  0-9 Units Subcutaneous Q4H   levothyroxine   150 mcg Oral Q0600   lidocaine   1 patch Transdermal Q24H   loratadine   10 mg Oral QHS   midodrine   5 mg Oral TID WC   mirtazapine   7.5 mg Oral  QHS   multivitamin  1 tablet Oral QHS   pantoprazole   40 mg Oral Daily   rosuvastatin   10 mg Oral Daily         dextrose  30 mL/hr at 09/25/24 1234   dialysis solution 1.5% low-MG/low-CA     dialysis solution 1.5% low-MG/low-CA          busPIRone , cetaphil, docusate sodium , heparin  sodium (porcine) 3,000 Units in dialysis solution 1.5% low-MG/low-CA 6,000 mL dialysis solution, HYDROmorphone  (DILAUDID ) injection, ondansetron  (ZOFRAN ) IV, oxyCODONE , polyethylene glycol

## 2024-09-26 NOTE — Progress Notes (Signed)
 Progress Note  Patient Name: [name] Date of Encounter: 09/26/2024  Primary Cardiologist: Felicia FORBES Calender, DO   Subjective   Doing well today. Was able to get some sleep. Denies chest pain, shortness of breath or any other complaints.   Inpatient Medications    Scheduled Meds:  allopurinol   100 mg Oral Daily   aspirin   81 mg Oral Daily   aspirin   81 mg Oral Pre-Cath   budesonide  (PULMICORT ) nebulizer solution  0.25 mg Nebulization BID   calcitRIOL   0.25 mcg Oral Once per day on Monday Wednesday Friday   Chlorhexidine  Gluconate Cloth  6 each Topical Daily   dolutegravir   50 mg Oral Daily   emtricitabine -tenofovir  AF  1 tablet Oral Daily   feeding supplement  237 mL Oral BID BM   fludrocortisone   0.1 mg Oral Daily   gabapentin   100 mg Oral TID   gentamicin  cream  1 Application Topical Daily   heparin  injection (subcutaneous)  5,000 Units Subcutaneous Q8H   hydrocortisone  sod succinate (SOLU-CORTEF ) inj  50 mg Intravenous Q12H   insulin  aspart  0-9 Units Subcutaneous Q4H   levothyroxine   150 mcg Oral Q0600   lidocaine   1 patch Transdermal Q24H   loratadine   10 mg Oral QHS   midodrine   5 mg Oral TID WC   mirtazapine   7.5 mg Oral QHS   multivitamin  1 tablet Oral QHS   pantoprazole   40 mg Oral Daily   rosuvastatin   10 mg Oral Daily   Continuous Infusions:  dialysis solution 1.5% low-MG/low-CA     dialysis solution 2.5% low-MG/low-CA     PRN Meds: busPIRone , cetaphil, docusate sodium , heparin  sodium (porcine) 3,000 Units in dialysis solution 1.5% low-MG/low-CA 6,000 mL dialysis solution, heparin  sodium (porcine) 3,000 Units in dialysis solution 2.5% low-MG/low-CA 6,000 mL dialysis solution, HYDROmorphone  (DILAUDID ) injection, ondansetron  (ZOFRAN ) IV, oxyCODONE , polyethylene glycol   Vital Signs    Vitals:   09/26/24 0500 09/26/24 0600 09/26/24 0700 09/26/24 0757  BP: 111/69 (!) 94/57 91/60   Pulse: 86 88 81   Resp: 11 11 10    Temp:    98 F (36.7 C)   TempSrc:    Oral  SpO2: 95% 95% 96%   Weight: 83.4 kg       Intake/Output Summary (Last 24 hours) at 09/26/2024 0825 Last data filed at 09/26/2024 0500 Gross per 24 hour  Intake 827.7 ml  Output --  Net 827.7 ml   Filed Weights   09/24/24 0500 09/25/24 0544 09/26/24 0500  Weight: 80.1 kg 85.6 kg 83.4 kg    Telemetry    NSR with PVCs - Personally Reviewed  ECG    NSR with LBBB and PVC - Personally Reviewed  Physical Exam   Physical Exam Vitals and nursing note reviewed.  Constitutional:      Appearance: Normal appearance.  HENT:     Head: Normocephalic and atraumatic.  Eyes:     Conjunctiva/sclera: Conjunctivae normal.  Cardiovascular:     Rate and Rhythm: Normal rate and regular rhythm.     Comments: Fixed splitting Pulmonary:     Effort: Pulmonary effort is normal.     Breath sounds: Normal breath sounds.  Musculoskeletal:        General: No swelling or tenderness.  Skin:    Coloration: Skin is not jaundiced or pale.  Neurological:     Mental Status: [name] is alert.      Labs    Chemistry Recent Labs  Lab 09/23/24  1431 09/24/24 0354 09/24/24 1731 09/25/24 0537 09/26/24 0532  NA 141 135  --  130* 130*  K 2.8* 2.9* 3.7 3.4* 3.8  CL 98 98  --  93* 94*  CO2 25 17*  --  20* 22  GLUCOSE 101* 136*  --  117* 166*  BUN 39* 35*  --  33* 37*  CREATININE 14.09* 11.85*  --  11.08* 10.62*  CALCIUM  8.1* 8.3*  --  8.4* 8.6*  PROT 6.6  --   --   --   --   ALBUMIN  1.9*  --   --   --   --   AST 19  --   --   --   --   ALT 9  --   --   --   --   ALKPHOS 244*  --   --   --   --   BILITOT 1.0  --   --   --   --   GFRNONAA 3* 3*  --  4* 4*  ANIONGAP 18* 20*  --  17* 14     Hematology Recent Labs  Lab 09/24/24 0354 09/25/24 0537 09/26/24 0532  WBC 10.8* 11.7* 16.1*  RBC 2.52* 2.53* 2.65*  HGB 9.2* 9.1* 9.8*  HCT 28.0* 27.5* 28.8*  MCV 111.1* 108.7* 108.7*  MCH 36.5* 36.0* 37.0*  MCHC 32.9 33.1 34.0  RDW 15.1 14.8 15.2  PLT 186 180 226     Cardiac EnzymesNo results for input(s): TROPONINI in the last 168 hours. No results for input(s): TROPIPOC in the last 168 hours.   BNP Recent Labs  Lab 09/24/24 0927  BNP 1,614.8*     DDimer No results for input(s): DDIMER in the last 168 hours.   Radiology    US  Abdomen Limited RUQ (LIVER/GB) Result Date: 09/24/2024 EXAM: Right Upper Quadrant Abdominal Ultrasound 09/24/2024 03:50:43 PM TECHNIQUE: Real-time ultrasonography of the right upper quadrant of the abdomen was performed. COMPARISON: None available. CLINICAL HISTORY: Abdominal pain FINDINGS: LIVER: The liver demonstrates normal echogenicity. No intrahepatic biliary ductal dilatation. No evidence of mass. BILIARY SYSTEM: No pericholecystic fluid. Cholesterolosis of the gallbladder wall, suggesting Adenomyomatosis. Layering 16 mm gallstone. Common bile duct is within normal limits measuring 3 mm. OTHER: No right upper quadrant ascites. IMPRESSION: 1. Cholelithiasis without sonographic findings to suggest acute cholecystitis. 2. Gallbladder adenomyomatosis, benign. Electronically signed by: Felicia Pebbles MD 09/24/2024 07:22 PM EST RP Workstation: [id number]   VAS US  LOWER EXTREMITY VENOUS (DVT) Result Date: 09/24/2024  Lower Venous DVT Study Patient Name:  [name] [facility]  Date of Exam:   09/24/2024 Medical Rec #: [id number]              Accession #:    [id number] Date of Birth: 07-10-1966              Patient Gender: F Patient Age:   58 years Exam Location:  [facility] Procedure:      VAS US  LOWER EXTREMITY VENOUS (DVT) Referring Phys: [name] --------------------------------------------------------------------------------  Indications: Edema. Other Indications: Anemia, H/O thyroid  cancer. Comparison Study: No prior exam. Performing Technologist: [name]  Examination Guidelines: A complete evaluation includes B-mode imaging, spectral Doppler, color Doppler, and power Doppler as needed of all  accessible portions of each vessel. Bilateral testing is considered an integral part of a complete examination. Limited examinations for reoccurring indications may be performed as noted. The reflux portion of the exam is performed with the patient in reverse Trendelenburg.  +---------+---------------+---------+-----------+----------+--------------+  RIGHT    CompressibilityPhasicitySpontaneityPropertiesThrombus Aging +---------+---------------+---------+-----------+----------+--------------+ CFV      Full           Yes      Yes                                 +---------+---------------+---------+-----------+----------+--------------+ SFJ      Full           Yes      Yes                                 +---------+---------------+---------+-----------+----------+--------------+ FV Prox  Full                                                        +---------+---------------+---------+-----------+----------+--------------+ FV Mid   Full                                                        +---------+---------------+---------+-----------+----------+--------------+ FV DistalFull                                                        +---------+---------------+---------+-----------+----------+--------------+ PFV      Full                                                        +---------+---------------+---------+-----------+----------+--------------+ POP      Full           Yes      Yes                                 +---------+---------------+---------+-----------+----------+--------------+ PTV      Full                                                        +---------+---------------+---------+-----------+----------+--------------+ PERO     Full                                                        +---------+---------------+---------+-----------+----------+--------------+   +---------+---------------+---------+-----------+----------+--------------+  LEFT     CompressibilityPhasicitySpontaneityPropertiesThrombus Aging +---------+---------------+---------+-----------+----------+--------------+ CFV      Full           Yes      Yes                                 +---------+---------------+---------+-----------+----------+--------------+  SFJ      Full           Yes      Yes                                 +---------+---------------+---------+-----------+----------+--------------+ FV Prox  Full                                                        +---------+---------------+---------+-----------+----------+--------------+ FV Mid   Full                                                        +---------+---------------+---------+-----------+----------+--------------+ FV DistalFull                                                        +---------+---------------+---------+-----------+----------+--------------+ PFV      Full                                                        +---------+---------------+---------+-----------+----------+--------------+ POP      Full           Yes      Yes                                 +---------+---------------+---------+-----------+----------+--------------+ PTV      Full                                                        +---------+---------------+---------+-----------+----------+--------------+ PERO     Full                                                        +---------+---------------+---------+-----------+----------+--------------+     Summary: BILATERAL: - No evidence of deep vein thrombosis seen in the lower extremities, bilaterally. -No evidence of popliteal cyst, bilaterally.   *See table(s) above for measurements and observations. Electronically signed by Norman Serve on 09/24/2024 at 3:43:33 PM.    Final    ECHOCARDIOGRAM COMPLETE Result Date: 09/24/2024    ECHOCARDIOGRAM REPORT   Patient Name:   IRANIA DURELL [facility] Date of Exam: 09/24/2024  Medical Rec #:  [id number]             Height:       65.0 in Accession #:    [id number]            Weight:  176.6 lb Date of Birth:  02/19/1966             BSA:          1.876 m Patient Age:    58 years              BP:           128/58 mmHg Patient Gender: F                     HR:           68 bpm. Exam Location:  Inpatient Procedure: 2D Echo, Cardiac Doppler, Color Doppler and Intracardiac            Opacification Agent (Both Spectral and Color Flow Doppler were            utilized during procedure). Indications:    Shock  History:        Patient has prior history of Echocardiogram examinations, most                 recent 01/30/2023. Risk Factors:Hypertension, Diabetes, Former                 Smoker and Dyslipidemia.  Sonographer:    Juliene Rucks Referring Phys: [id number] Felicia JAYSON Tate  Sonographer Comments: Patient is obese. IMPRESSIONS  1. Left ventricular ejection fraction, by estimation, is 30 to 35%. The left ventricle has moderately decreased function. The left ventricle demonstrates regional wall motion abnormalities (see scoring diagram/findings for description). There is mild concentric left ventricular hypertrophy. Left ventricular diastolic parameters are consistent with Grade II diastolic dysfunction (pseudonormalization). Elevated left ventricular end-diastolic pressure.  2. Right ventricular systolic function is normal. The right ventricular size is normal.  3. Left atrial size was severely dilated.  4. The mitral valve is normal in structure. Moderate to severe mitral valve regurgitation. No evidence of mitral stenosis.  5. The aortic valve is normal in structure. Aortic valve regurgitation is not visualized. No aortic stenosis is present.  6. The inferior vena cava is normal in size with greater than 50% respiratory variability, suggesting right atrial pressure of 3 mmHg. FINDINGS  Left Ventricle: Left ventricular ejection fraction, by estimation, is 30 to 35%. The left ventricle has moderately  decreased function. The left ventricle demonstrates regional wall motion abnormalities. Definity  contrast agent was given IV to delineate the left ventricular endocardial borders. The left ventricular internal cavity size was normal in size. There is mild concentric left ventricular hypertrophy. Left ventricular diastolic parameters are consistent with Grade II diastolic dysfunction (pseudonormalization). Elevated left ventricular end-diastolic pressure.  LV Wall Scoring: The entire apex is akinetic. The anterior wall, antero-lateral wall, anterior septum, inferior wall, posterior wall, mid inferoseptal segment, and basal inferoseptal segment are hypokinetic. Right Ventricle: The right ventricular size is normal. No increase in right ventricular wall thickness. Right ventricular systolic function is normal. Left Atrium: Left atrial size was severely dilated. Right Atrium: Right atrial size was normal in size. Pericardium: Trivial pericardial effusion is present. The pericardial effusion is circumferential. Mitral Valve: The mitral valve is normal in structure. Moderate to severe mitral valve regurgitation. No evidence of mitral valve stenosis. Tricuspid Valve: The tricuspid valve is normal in structure. Tricuspid valve regurgitation is not demonstrated. No evidence of tricuspid stenosis. Aortic Valve: The aortic valve is normal in structure. Aortic valve regurgitation is not visualized. No aortic stenosis is present. Pulmonic Valve: The pulmonic valve was normal in structure. Pulmonic valve regurgitation is  not visualized. No evidence of pulmonic stenosis. Aorta: The aortic root is normal in size and structure. Venous: The inferior vena cava is normal in size with greater than 50% respiratory variability, suggesting right atrial pressure of 3 mmHg. IAS/Shunts: No atrial level shunt detected by color flow Doppler.  LEFT VENTRICLE PLAX 2D LVIDd:         5.20 cm      Diastology LVIDs:         3.80 cm      LV e' medial:     4.90 cm/s LV PW:         1.10 cm      LV E/e' medial:  22.7 LV IVS:        1.00 cm      LV e' lateral:   7.72 cm/s LVOT diam:     2.10 cm      LV E/e' lateral: 14.4 LV SV:         76 LV SV Index:   40 LVOT Area:     3.46 cm  LV Volumes (MOD) LV vol d, MOD A2C: 222.0 ml LV vol d, MOD A4C: 268.0 ml LV vol s, MOD A2C: 158.0 ml LV vol s, MOD A4C: 197.0 ml LV SV MOD A2C:     64.0 ml LV SV MOD A4C:     268.0 ml LV SV MOD BP:      68.3 ml RIGHT VENTRICLE             IVC RV Basal diam:  3.60 cm     IVC diam: 1.80 cm RV Mid diam:    2.50 cm RV S prime:     11.00 cm/s TAPSE (M-mode): 1.7 cm LEFT ATRIUM             Index        RIGHT ATRIUM           Index LA diam:        4.10 cm 2.19 cm/m   RA Area:     13.30 cm LA Vol (A2C):   78.1 ml 41.63 ml/m  RA Volume:   29.40 ml  15.67 ml/m LA Vol (A4C):   95.1 ml 50.69 ml/m LA Biplane Vol: 91.2 ml 48.61 ml/m  AORTIC VALVE LVOT Vmax:   107.00 cm/s LVOT Vmean:  69.800 cm/s LVOT VTI:    0.219 m  AORTA Ao Root diam: 2.90 cm MITRAL VALVE                TRICUSPID VALVE MV Area (PHT): 3.77 cm     TR Peak grad:   13.7 mmHg MV Decel Time: 201 msec     TR Vmax:        185.00 cm/s MR Peak grad: 115.8 mmHg MR Vmax:      538.00 cm/s   SHUNTS MV E velocity: 111.00 cm/s  Systemic VTI:  0.22 m MV A velocity: 101.00 cm/s  Systemic Diam: 2.10 cm MV E/A ratio:  1.10 Kardie Tobb DO Electronically signed by Dub Huntsman DO Signature Date/Time: 09/24/2024/1:41:17 PM    Final     Cardiac Studies   TTE 09/24/24:   1. Left ventricular ejection fraction, by estimation, is 30 to 35%. The  left ventricle has moderately decreased function. The left ventricle  demonstrates regional wall motion abnormalities (*see scoring  diagram/findings for description). There is mild  concentric left ventricular hypertrophy. Left ventricular diastolic  parameters are consistent with Grade II diastolic dysfunction  (pseudonormalization).  Elevated left ventricular end-diastolic pressure.   2. Right  ventricular systolic function is normal. The right ventricular  size is normal.   3. Left atrial size was severely dilated.   4. The mitral valve is normal in structure. Moderate to severe mitral  valve regurgitation. No evidence of mitral stenosis.   5. The aortic valve is normal in structure. Aortic valve regurgitation is  not visualized. No aortic stenosis is present.   6. The inferior vena cava is normal in size with greater than 50%  respiratory variability, suggesting right atrial pressure of 3 mmHg.   *LV Wall Scoring:  The entire apex is akinetic. The anterior wall, antero-lateral wall, anterior  septum, inferior wall, posterior wall, mid inferoseptal segment, and basal  inferoseptal segment are hypokinetic.   Patient Profile     This is a 58 year old female with past medical history of diabetes, coronary calcification on prior CT, ESRD on peritoneal dialysis, aortic dissection s/p repair, upper extremity DVT, OSA, HIV, hypertension, Graves' disease, breast cancer previously on chemotherapy (carboplatin , docetaxel , trastuzumab  and Pertuzumab  changed to trastuzumab  due to diarrhea), left bundle branch block who presented to the ED with worsening shortness of breath and malaise.  Cardiology was consulted for abnormal echocardiogram with newly reduced ejection fraction of 30 to 35%, wall motion abnormalities and moderate to severe MR.  [name] has not had any chest pain.   Assessment & Plan   Assessment & Plan:  New combined systolic and diastolic HF-echo shows ejection fraction of 30 to 35% with an akinetic apex and hypokinesis of remaining segments.  My personal review the anterior septum appears more hypokinetic than the remaining segments however the baseline LBBB makes this difficult to assess.  When compared to prior echoes in the setting of LBBB, however, [name] did not have such significant wall motion abnormalities in the septal area. Unable to start GDMT due to tenuous BP.  Given  elevated troponin and newly reduced EF with wall motion abnormalities, will plan on left and right heart catheterization today, though I suspect this is more likely stress cardiomyopathy. [name] is able to make a little urine now and on PD but understands that the contrast puts her at risk of becoming anuric.  Has LBBB with widened QRS but will need to be on medically optimized GDMT prior to CRT placement, if needed.  Volume management per nephrology LBBB Moderate to severe MR Circulatory shock, appears resolved - off pressors, on steroids. discussed with Dr. Meade who states patient is stable to proceed with cath. 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

## 2024-09-26 NOTE — Progress Notes (Signed)
 PT Cancellation Note  Patient Details Name: Whittney Steenson MRN: [id number] DOB: 06/19/1966   Cancelled Treatment:    Reason Eval/Treat Not Completed: Patient's level of consciousness  Off unit for cath. Will attempt again tomorrow.  Leontine Roads, PT, DPT [facility] Services Physical Therapist Office: [phone] Website: [email or web address]  Leontine GORMAN Roads 09/26/2024, 2:51 PM

## 2024-09-26 NOTE — Progress Notes (Signed)
 PROGRESS NOTE    [name]  FMW:[id number] DOB: 07/23/1966 DOA: 09/23/2024 PCP: Gretta Comer POUR, NP   Brief Narrative:  [name] is a 58 year old female with history of ESRD on PD, anemia, anxiety, asthma, T2DM, aortic dissection s/p endovascular repair, GERD, Grave's disease, HTN, HLD, papillary thyroid  cancer s/p thyroidectomy (2018) PVD, falls, sarcoidosis, suppurative hydradenitis who presents from dialysis with worsening back pain abdominal pain dyspnea found to be profoundly hypotensive despite IV fluids. CTA CAP showed stable thoracoabdominal aortic aneurysm and dissection with no significant change in the endoluminal stent graft, no evidence of aortic leak or rupture and persistent pneumoperitoneum and pelvic free fluid in the setting of peritoneal dialysis catheter.   Recently admitted to our facility late November for intractable nausea vomiting diarrhea treated empirically for C. difficile at that time.   Assessment & Plan:   Principal Problem:   Shock (HCC) Active Problems:   Malnutrition of moderate degree  Adrenal insufficiency History of pituitary apoplexy History of papillary thyroid  cancer s/p thyroidectomy - Recent pituitary apoplexy makes ACTH  stim test unreliable - Morning cortisol is <10.  -TSH normal -continue home Synthroid  dose - Empiric hydrocortisone  and fludrocortisone  ongoing since 12/9 - Shock resolved with initiation of steroids - Antibiotics discontinued  - Cultures remain negative   HFrEF, without acute exacerbation - Echocardiogram 12/9 with EF of 30 to 35% with LV wall motion abnormality and grade 2 diastolic dysfunction.  Drop in EF and WMA are both new compared to prior echoes - Cardiology following, left right heart cath 09/26/2024 with normal coronary arteries - Volume status managed by dialysis - Heart healthy diet; GDMT as able to tolerate   Elevated troponin - Heart cath with clean coronary arteries   Unlikely PD site  infection  - Low suspicion - culture remains negative - Antibiotics discontinued, follow clinically  Subacute low back pain Subacute abdominal pain  - Reportedly low back pain present since dissection repair.  - History of aortic dissection - negative for any changes on repeat imaging.  - MRI T and L spine negative.   - RUQ US  unremarkable for any acute findings - Avoid narcotics   History of Type B aortic dissection s/p TEVAR and aortic debranching (08/25/2023) - Neurovascular exam intact. CTA with stable dissection and endoluminal stent graft, no leak or rupture. Vascular indicates no further need for imaging intervention or evaluation -continue medical support   ESRD Hypokalemia Hypocalcemia Hypomagnesemia  - Nephrology consulted, appreciate insight recommendations - Continue nightly PD per nephrology   Chronic nausea/vomiting, stable Moderate malnutrition Decreased appetite Hypoalbuminemia - RD and SLP following -Continue appropriate diet, Remeron    History of HIV -CD4 445 on 10/15.  - Continue HAART   Anemia, likely anemia of chronic disease - Follow clinically, no signs symptoms of bleeding   History of T2DM Continue SSI, hypoglycemic protocol   Deconditioning, ambulatory dysfunction, weakness PT OT following, current plan for discharge to SNF  DVT prophylaxis: heparin  injection 5,000 Units Start: 09/24/24 1400 SCDs Start: 09/23/24 1603 Code Status:   Code Status: Full Code Family Communication: At bedside  Status is: Inpatient  Dispo: The patient is from: Home              Anticipated d/c is to: SNF              Anticipated d/c date is: 48 to 72 hours              Patient currently not medically stable for  discharge  Consultants:  PCCM, nephrology, cardiology, infectious disease  Antimicrobials:  Discontinued as above  Subjective: No acute issues or events overnight still complaining of some chronic nausea but otherwise denies vomiting headache fever  chills chest pain shortness of breath diarrhea constipation  Objective: Vitals:   09/26/24 0400 09/26/24 0500 09/26/24 0600 09/26/24 0700  BP: 129/66 111/69 (!) 94/57 91/60  Pulse: 83 86 88 81  Resp: 15 11 11 10   Temp:      TempSrc:      SpO2: 94% 95% 95% 96%  Weight:  83.4 kg      Intake/Output Summary (Last 24 hours) at 09/26/2024 0755 Last data filed at 09/26/2024 0500 Gross per 24 hour  Intake 857.7 ml  Output 167 ml  Net 690.7 ml   Filed Weights   09/24/24 0500 09/25/24 0544 09/26/24 0500  Weight: 80.1 kg 85.6 kg 83.4 kg    Examination:  General:  Pleasantly resting in bed, No acute distress. HEENT:  Normocephalic atraumatic.  Sclerae nonicteric, noninjected.  Extraocular movements intact bilaterally. Neck:  Without mass or deformity.  Trachea is midline. Lungs:  Clear to auscultate bilaterally without rhonchi, wheeze, or rales. Heart:  Regular rate and rhythm.  Without murmurs, rubs, or gallops. Abdomen: PD catheter noted clean dry intact. Extremities: Without cyanosis, clubbing, edema, or obvious deformity. Skin:  Warm and dry, no erythema.  Data Reviewed: I have personally reviewed following labs and imaging studies  CBC: Recent Labs  Lab 09/23/24 1401 09/23/24 1431 09/24/24 0354 09/25/24 0537 09/26/24 0532  WBC  --  7.8 10.8* 11.7* 16.1*  NEUTROABS  --  5.1  --   --   --   HGB 13.6 11.8* 9.2* 9.1* 9.8*  HCT 40.0 36.9 28.0* 27.5* 28.8*  MCV  --  111.1* 111.1* 108.7* 108.7*  PLT  --  214 186 180 226   Basic Metabolic Panel: Recent Labs  Lab 09/23/24 1352 09/23/24 1401 09/23/24 1431 09/24/24 0354 09/24/24 1731 09/25/24 0537 09/26/24 0532  NA 137 134* 141 135  --  130* 130*  K 4.2 3.8 2.8* 2.9* 3.7 3.4* 3.8  CL 101  --  98 98  --  93* 94*  CO2  --   --  25 17*  --  20* 22  GLUCOSE 134*  --  101* 136*  --  117* 166*  BUN 57*  --  39* 35*  --  33* 37*  CREATININE 14.30*  --  14.09* 11.85*  --  11.08* 10.62*  CALCIUM   --   --  8.1* 8.3*  --   8.4* 8.6*  MG  --   --   --  1.4* 2.7* 2.6* 2.3  PHOS  --   --   --  3.9  --  3.7 4.3   GFR: Estimated Creatinine Clearance: 6.3 mL/min (A) (by C-G formula based on SCr of 10.62 mg/dL (H)). Liver Function Tests: Recent Labs  Lab 09/23/24 1431  AST 19  ALT 9  ALKPHOS 244*  BILITOT 1.0  PROT 6.6  ALBUMIN  1.9*   Recent Labs  Lab 09/23/24 1839  LIPASE 30  AMYLASE 48   Coagulation Profile: Recent Labs  Lab 09/23/24 1431  INR 1.1   CBG: Recent Labs  Lab 09/25/24 1130 09/25/24 1529 09/25/24 1934 09/25/24 2331 09/26/24 0336  GLUCAP 117* 222* 103* 143* 206*   Thyroid  Function Tests: Recent Labs    09/23/24 1839  TSH 1.520   Anemia Panel: Recent Labs    09/24/24  9072  VITAMINB12 1,163*  FOLATE 14.6   Sepsis Labs: Recent Labs  Lab 09/23/24 1352 09/23/24 1357 09/23/24 1838 09/24/24 0000  LATICACIDVEN 5.0* 5.1* 2.2* 1.2    Recent Results (from the past 240 hours)  WET PREP BY MOLECULAR PROBE     Status: None   Collection Time: 09/20/24  1:15 PM   Specimen: Vaginal Swab  Result Value Ref Range Status   MICRO NUMBER: [id number]  Final   SPECIMEN QUALITY: Adequate  Final   SOURCE: VAGINA  Final   STATUS: FINAL  Final   Trichomonas vaginosis Not Detected  Final   Gardnerella vaginalis Not Detected  Final   Candida species Not Detected  Final  Blood Culture (routine x 2)     Status: None (Preliminary result)   Collection Time: 09/23/24  2:31 PM   Specimen: BLOOD  Result Value Ref Range Status   Specimen Description BLOOD CENTRAL LINE  Final   Special Requests   Final    BOTTLES DRAWN AEROBIC AND ANAEROBIC Blood Culture adequate volume   Culture   Final    NO GROWTH 2 DAYS Performed at [facility], [address], [place], [place] [address]    Report Status PENDING  Incomplete  MRSA Next Gen by PCR, Nasal     Status: None   Collection Time: 09/23/24  4:02 PM   Specimen: Nasal Mucosa; Nasal Swab  Result Value Ref Range Status   MRSA by PCR  Next Gen NOT DETECTED NOT DETECTED Final    Comment: (NOTE) The GeneXpert MRSA Assay (FDA approved for NASAL specimens only), is one component of a comprehensive MRSA colonization surveillance program. It is not intended to diagnose MRSA infection nor to guide or monitor treatment for MRSA infections. Test performance is not FDA approved in patients less than 61 years old. Performed at [facility], [address] [place], [place] [address]   Blood Culture (routine x 2)     Status: None (Preliminary result)   Collection Time: 09/23/24  5:27 PM   Specimen: BLOOD  Result Value Ref Range Status   Specimen Description BLOOD SITE NOT SPECIFIED  Final   Special Requests   Final    BOTTLES DRAWN AEROBIC ONLY Blood Culture results may not be optimal due to an inadequate volume of blood received in culture bottles   Culture   Final    NO GROWTH 2 DAYS Performed at [facility], [address] [place], [place] [address]    Report Status PENDING  Incomplete  Body fluid culture w Gram Stain     Status: None (Preliminary result)   Collection Time: 09/24/24  7:00 AM   Specimen: Peritoneal Dialysate; Body Fluid  Result Value Ref Range Status   Specimen Description PERITONEAL DIALYSATE  Final   Special Requests Immunocompromised ABDOMINAL  Final   Gram Stain NO WBC SEEN NO ORGANISMS SEEN   Final   Culture   Final    NO GROWTH 2 DAYS Performed at [facility], [address] [place], [place] [address]    Report Status PENDING  Incomplete         Radiology Studies: US  Abdomen Limited RUQ (LIVER/GB) Result Date: 09/24/2024 EXAM: Right Upper Quadrant Abdominal Ultrasound 09/24/2024 03:50:43 PM TECHNIQUE: Real-time ultrasonography of the right upper quadrant of the abdomen was performed. COMPARISON: None available. CLINICAL HISTORY: Abdominal pain FINDINGS: LIVER: The liver demonstrates normal echogenicity. No intrahepatic biliary ductal dilatation. No evidence of mass.  BILIARY SYSTEM: No pericholecystic fluid. Cholesterolosis of the gallbladder wall, suggesting Adenomyomatosis. Layering 16 mm gallstone. Common bile duct is within normal limits measuring 3 mm. OTHER: No right upper quadrant ascites. IMPRESSION: 1. Cholelithiasis without sonographic findings to suggest acute cholecystitis. 2. Gallbladder adenomyomatosis, benign. Electronically signed by: Pinkie Pebbles MD 09/24/2024 07:22 PM EST RP Workstation: [id number]   VAS US  LOWER EXTREMITY VENOUS (DVT) Result Date: 09/24/2024  Lower Venous DVT Study Patient Name:  [name] [facility]  Date of Exam:   09/24/2024 Medical Rec #: [id number]              Accession #:    [id number] Date of Birth: May 08, 1966              Patient Gender: F Patient Age:   48 years Exam Location:  [facility] Procedure:      VAS US  LOWER EXTREMITY VENOUS (DVT) Referring Phys: TORIBIO SHARPS --------------------------------------------------------------------------------  Indications: Edema. Other Indications: Anemia, H/O thyroid  cancer. Comparison Study: No prior exam. Performing Technologist: Edilia Elden Appl  Examination Guidelines: A complete evaluation includes B-mode imaging, spectral Doppler, color Doppler, and power Doppler as needed of all accessible portions of each vessel. Bilateral testing is considered an integral part of a complete examination. Limited examinations for reoccurring indications may be performed as noted. The reflux portion of the exam is performed with the patient in reverse Trendelenburg.  +---------+---------------+---------+-----------+----------+--------------+ RIGHT    CompressibilityPhasicitySpontaneityPropertiesThrombus Aging +---------+---------------+---------+-----------+----------+--------------+ CFV      Full           Yes      Yes                                 +---------+---------------+---------+-----------+----------+--------------+ SFJ      Full           Yes      Yes                                  +---------+---------------+---------+-----------+----------+--------------+ FV Prox  Full                                                        +---------+---------------+---------+-----------+----------+--------------+ FV Mid   Full                                                        +---------+---------------+---------+-----------+----------+--------------+ FV DistalFull                                                        +---------+---------------+---------+-----------+----------+--------------+ PFV      Full                                                        +---------+---------------+---------+-----------+----------+--------------+  POP      Full           Yes      Yes                                 +---------+---------------+---------+-----------+----------+--------------+ PTV      Full                                                        +---------+---------------+---------+-----------+----------+--------------+ PERO     Full                                                        +---------+---------------+---------+-----------+----------+--------------+   +---------+---------------+---------+-----------+----------+--------------+ LEFT     CompressibilityPhasicitySpontaneityPropertiesThrombus Aging +---------+---------------+---------+-----------+----------+--------------+ CFV      Full           Yes      Yes                                 +---------+---------------+---------+-----------+----------+--------------+ SFJ      Full           Yes      Yes                                 +---------+---------------+---------+-----------+----------+--------------+ FV Prox  Full                                                        +---------+---------------+---------+-----------+----------+--------------+ FV Mid   Full                                                         +---------+---------------+---------+-----------+----------+--------------+ FV DistalFull                                                        +---------+---------------+---------+-----------+----------+--------------+ PFV      Full                                                        +---------+---------------+---------+-----------+----------+--------------+ POP      Full           Yes      Yes                                 +---------+---------------+---------+-----------+----------+--------------+  PTV      Full                                                        +---------+---------------+---------+-----------+----------+--------------+ PERO     Full                                                        +---------+---------------+---------+-----------+----------+--------------+     Summary: BILATERAL: - No evidence of deep vein thrombosis seen in the lower extremities, bilaterally. -No evidence of popliteal cyst, bilaterally.   *See table(s) above for measurements and observations. Electronically signed by Norman Serve on 09/24/2024 at 3:43:33 PM.    Final    ECHOCARDIOGRAM COMPLETE Result Date: 09/24/2024    ECHOCARDIOGRAM REPORT   Patient Name:   [name] [facility] Date of Exam: 09/24/2024 Medical Rec #:  [id number]             Height:       65.0 in Accession #:    [id number]            Weight:       176.6 lb Date of Birth:  1966-08-20             BSA:          1.876 m Patient Age:    58 years              BP:           128/58 mmHg Patient Gender: F                     HR:           68 bpm. Exam Location:  Inpatient Procedure: 2D Echo, Cardiac Doppler, Color Doppler and Intracardiac            Opacification Agent (Both Spectral and Color Flow Doppler were            utilized during procedure). Indications:    Shock  History:        Patient has prior history of Echocardiogram examinations, most                 recent 01/30/2023. Risk Factors:Hypertension, Diabetes,  Former                 Smoker and Dyslipidemia.  Sonographer:    Juliene Rucks Referring Phys: [id number] TORIBIO JAYSON SHARPS  Sonographer Comments: Patient is obese. IMPRESSIONS  1. Left ventricular ejection fraction, by estimation, is 30 to 35%. The left ventricle has moderately decreased function. The left ventricle demonstrates regional wall motion abnormalities (see scoring diagram/findings for description). There is mild concentric left ventricular hypertrophy. Left ventricular diastolic parameters are consistent with Grade II diastolic dysfunction (pseudonormalization). Elevated left ventricular end-diastolic pressure.  2. Right ventricular systolic function is normal. The right ventricular size is normal.  3. Left atrial size was severely dilated.  4. The mitral valve is normal in structure. Moderate to severe mitral valve regurgitation. No evidence of mitral stenosis.  5. The aortic valve is normal in structure. Aortic valve regurgitation is not visualized. No aortic stenosis is present.  6.  The inferior vena cava is normal in size with greater than 50% respiratory variability, suggesting right atrial pressure of 3 mmHg. FINDINGS  Left Ventricle: Left ventricular ejection fraction, by estimation, is 30 to 35%. The left ventricle has moderately decreased function. The left ventricle demonstrates regional wall motion abnormalities. Definity  contrast agent was given IV to delineate the left ventricular endocardial borders. The left ventricular internal cavity size was normal in size. There is mild concentric left ventricular hypertrophy. Left ventricular diastolic parameters are consistent with Grade II diastolic dysfunction (pseudonormalization). Elevated left ventricular end-diastolic pressure.  LV Wall Scoring: The entire apex is akinetic. The anterior wall, antero-lateral wall, anterior septum, inferior wall, posterior wall, mid inferoseptal segment, and basal inferoseptal segment are hypokinetic. Right Ventricle:  The right ventricular size is normal. No increase in right ventricular wall thickness. Right ventricular systolic function is normal. Left Atrium: Left atrial size was severely dilated. Right Atrium: Right atrial size was normal in size. Pericardium: Trivial pericardial effusion is present. The pericardial effusion is circumferential. Mitral Valve: The mitral valve is normal in structure. Moderate to severe mitral valve regurgitation. No evidence of mitral valve stenosis. Tricuspid Valve: The tricuspid valve is normal in structure. Tricuspid valve regurgitation is not demonstrated. No evidence of tricuspid stenosis. Aortic Valve: The aortic valve is normal in structure. Aortic valve regurgitation is not visualized. No aortic stenosis is present. Pulmonic Valve: The pulmonic valve was normal in structure. Pulmonic valve regurgitation is not visualized. No evidence of pulmonic stenosis. Aorta: The aortic root is normal in size and structure. Venous: The inferior vena cava is normal in size with greater than 50% respiratory variability, suggesting right atrial pressure of 3 mmHg. IAS/Shunts: No atrial level shunt detected by color flow Doppler.  LEFT VENTRICLE PLAX 2D LVIDd:         5.20 cm      Diastology LVIDs:         3.80 cm      LV e' medial:    4.90 cm/s LV PW:         1.10 cm      LV E/e' medial:  22.7 LV IVS:        1.00 cm      LV e' lateral:   7.72 cm/s LVOT diam:     2.10 cm      LV E/e' lateral: 14.4 LV SV:         76 LV SV Index:   40 LVOT Area:     3.46 cm  LV Volumes (MOD) LV vol d, MOD A2C: 222.0 ml LV vol d, MOD A4C: 268.0 ml LV vol s, MOD A2C: 158.0 ml LV vol s, MOD A4C: 197.0 ml LV SV MOD A2C:     64.0 ml LV SV MOD A4C:     268.0 ml LV SV MOD BP:      68.3 ml RIGHT VENTRICLE             IVC RV Basal diam:  3.60 cm     IVC diam: 1.80 cm RV Mid diam:    2.50 cm RV S prime:     11.00 cm/s TAPSE (M-mode): 1.7 cm LEFT ATRIUM             Index        RIGHT ATRIUM           Index LA diam:        4.10 cm  2.19 cm/m   RA Area:  13.30 cm LA Vol (A2C):   78.1 ml 41.63 ml/m  RA Volume:   29.40 ml  15.67 ml/m LA Vol (A4C):   95.1 ml 50.69 ml/m LA Biplane Vol: 91.2 ml 48.61 ml/m  AORTIC VALVE LVOT Vmax:   107.00 cm/s LVOT Vmean:  69.800 cm/s LVOT VTI:    0.219 m  AORTA Ao Root diam: 2.90 cm MITRAL VALVE                TRICUSPID VALVE MV Area (PHT): 3.77 cm     TR Peak grad:   13.7 mmHg MV Decel Time: 201 msec     TR Vmax:        185.00 cm/s MR Peak grad: 115.8 mmHg MR Vmax:      538.00 cm/s   SHUNTS MV E velocity: 111.00 cm/s  Systemic VTI:  0.22 m MV A velocity: 101.00 cm/s  Systemic Diam: 2.10 cm MV E/A ratio:  1.10 Kardie Tobb DO Electronically signed by Kardie Tobb DO Signature Date/Time: 09/24/2024/1:41:17 PM    Final         Scheduled Meds:  allopurinol   100 mg Oral Daily   aspirin   81 mg Oral Daily   aspirin   81 mg Oral Pre-Cath   budesonide  (PULMICORT ) nebulizer solution  0.25 mg Nebulization BID   calcitRIOL   0.25 mcg Oral Once per day on Monday Wednesday Friday   Chlorhexidine  Gluconate Cloth  6 each Topical Daily   dolutegravir   50 mg Oral Daily   emtricitabine -tenofovir  AF  1 tablet Oral Daily   feeding supplement  237 mL Oral BID BM   fludrocortisone   0.1 mg Oral Daily   gabapentin   100 mg Oral TID   gentamicin  cream  1 Application Topical Daily   heparin  injection (subcutaneous)  5,000 Units Subcutaneous Q8H   hydrocortisone  sod succinate (SOLU-CORTEF ) inj  50 mg Intravenous Q12H   insulin  aspart  0-9 Units Subcutaneous Q4H   levothyroxine   150 mcg Oral Q0600   lidocaine   1 patch Transdermal Q24H   loratadine   10 mg Oral QHS   midodrine   5 mg Oral TID WC   mirtazapine   7.5 mg Oral QHS   multivitamin  1 tablet Oral QHS   pantoprazole   40 mg Oral Daily   rosuvastatin   10 mg Oral Daily   Continuous Infusions:  dialysis solution 1.5% low-MG/low-CA     dialysis solution 2.5% low-MG/low-CA       LOS: 3 days   Time spent:  Elsie JAYSON [place], DO [facility]  If 7PM-7AM, please contact night-coverage [email or web address]  09/26/2024, 7:55 AM

## 2024-09-26 NOTE — Progress Notes (Addendum)
 Inpatient Rehab Admissions Coordinator:   Left message for pt's sister to discuss rehab recommendations and caregiver support.  Rehab MD to consult on today.   1154: Spoke to Share on the phone to reviewed recommendations and expectations of rehab stay based on consult from Dr. Babs.  We reviewed pt will likely only have intermittent assist but family can provide help for meals, transport, PD, etc.  Reviewed need for prior auth from [facility].  Pt scheduled for heart cath this afternoon so I will review for any change in plan of care and then send to [facility].  Reche Lowers, PT, DPT Admissions Coordinator [phone] 09/26/2024 10:34 AM

## 2024-09-27 ENCOUNTER — Other Ambulatory Visit (HOSPITAL_COMMUNITY): Payer: Self-pay

## 2024-09-27 ENCOUNTER — Inpatient Hospital Stay (HOSPITAL_COMMUNITY)
Admission: AD | Admit: 2024-09-27 | Discharge: 2024-10-08 | DRG: 977 | Disposition: A | Discharge status: Home Health | Source: Intra-hospital | Attending: Physical Medicine and Rehabilitation | Admitting: Physical Medicine and Rehabilitation

## 2024-09-27 ENCOUNTER — Other Ambulatory Visit: Payer: Self-pay

## 2024-09-27 ENCOUNTER — Encounter (HOSPITAL_COMMUNITY): Payer: Self-pay | Admitting: Cardiovascular Disease

## 2024-09-27 DIAGNOSIS — E1169 Type 2 diabetes mellitus with other specified complication: Secondary | ICD-10-CM | POA: Diagnosis present

## 2024-09-27 DIAGNOSIS — Z833 Family history of diabetes mellitus: Secondary | ICD-10-CM

## 2024-09-27 DIAGNOSIS — Z87891 Personal history of nicotine dependence: Secondary | ICD-10-CM

## 2024-09-27 DIAGNOSIS — R195 Other fecal abnormalities: Secondary | ICD-10-CM | POA: Diagnosis not present

## 2024-09-27 DIAGNOSIS — E2740 Unspecified adrenocortical insufficiency: Secondary | ICD-10-CM | POA: Diagnosis present

## 2024-09-27 DIAGNOSIS — R579 Shock, unspecified: Secondary | ICD-10-CM | POA: Diagnosis not present

## 2024-09-27 DIAGNOSIS — D72829 Elevated white blood cell count, unspecified: Secondary | ICD-10-CM | POA: Diagnosis present

## 2024-09-27 DIAGNOSIS — Z8249 Family history of ischemic heart disease and other diseases of the circulatory system: Secondary | ICD-10-CM

## 2024-09-27 DIAGNOSIS — M1A9XX0 Chronic gout, unspecified, without tophus (tophi): Secondary | ICD-10-CM | POA: Diagnosis present

## 2024-09-27 DIAGNOSIS — T380X5A Adverse effect of glucocorticoids and synthetic analogues, initial encounter: Secondary | ICD-10-CM | POA: Diagnosis present

## 2024-09-27 DIAGNOSIS — I10 Essential (primary) hypertension: Secondary | ICD-10-CM | POA: Diagnosis not present

## 2024-09-27 DIAGNOSIS — I428 Other cardiomyopathies: Secondary | ICD-10-CM | POA: Diagnosis present

## 2024-09-27 DIAGNOSIS — K219 Gastro-esophageal reflux disease without esophagitis: Secondary | ICD-10-CM | POA: Diagnosis present

## 2024-09-27 DIAGNOSIS — D649 Anemia, unspecified: Secondary | ICD-10-CM | POA: Diagnosis present

## 2024-09-27 DIAGNOSIS — Z923 Personal history of irradiation: Secondary | ICD-10-CM

## 2024-09-27 DIAGNOSIS — F32A Depression, unspecified: Secondary | ICD-10-CM | POA: Diagnosis present

## 2024-09-27 DIAGNOSIS — D62 Acute posthemorrhagic anemia: Secondary | ICD-10-CM | POA: Diagnosis not present

## 2024-09-27 DIAGNOSIS — Z8042 Family history of malignant neoplasm of prostate: Secondary | ICD-10-CM

## 2024-09-27 DIAGNOSIS — Z8616 Personal history of COVID-19: Secondary | ICD-10-CM | POA: Diagnosis not present

## 2024-09-27 DIAGNOSIS — Z85850 Personal history of malignant neoplasm of thyroid: Secondary | ICD-10-CM

## 2024-09-27 DIAGNOSIS — Z888 Allergy status to other drugs, medicaments and biological substances status: Secondary | ICD-10-CM

## 2024-09-27 DIAGNOSIS — E785 Hyperlipidemia, unspecified: Secondary | ICD-10-CM | POA: Diagnosis present

## 2024-09-27 DIAGNOSIS — Z79899 Other long term (current) drug therapy: Secondary | ICD-10-CM

## 2024-09-27 DIAGNOSIS — E236 Other disorders of pituitary gland: Secondary | ICD-10-CM | POA: Diagnosis present

## 2024-09-27 DIAGNOSIS — R5381 Other malaise: Principal | ICD-10-CM | POA: Diagnosis present

## 2024-09-27 DIAGNOSIS — G4700 Insomnia, unspecified: Secondary | ICD-10-CM | POA: Diagnosis present

## 2024-09-27 DIAGNOSIS — E1122 Type 2 diabetes mellitus with diabetic chronic kidney disease: Secondary | ICD-10-CM | POA: Diagnosis present

## 2024-09-27 DIAGNOSIS — B20 Human immunodeficiency virus [HIV] disease: Secondary | ICD-10-CM | POA: Diagnosis present

## 2024-09-27 DIAGNOSIS — Z7951 Long term (current) use of inhaled steroids: Secondary | ICD-10-CM

## 2024-09-27 DIAGNOSIS — E039 Hypothyroidism, unspecified: Secondary | ICD-10-CM | POA: Diagnosis present

## 2024-09-27 DIAGNOSIS — I214 Non-ST elevation (NSTEMI) myocardial infarction: Secondary | ICD-10-CM | POA: Diagnosis present

## 2024-09-27 DIAGNOSIS — D631 Anemia in chronic kidney disease: Secondary | ICD-10-CM | POA: Diagnosis present

## 2024-09-27 DIAGNOSIS — N2581 Secondary hyperparathyroidism of renal origin: Secondary | ICD-10-CM | POA: Diagnosis present

## 2024-09-27 DIAGNOSIS — G620 Drug-induced polyneuropathy: Secondary | ICD-10-CM | POA: Diagnosis present

## 2024-09-27 DIAGNOSIS — Z992 Dependence on renal dialysis: Secondary | ICD-10-CM

## 2024-09-27 DIAGNOSIS — E1140 Type 2 diabetes mellitus with diabetic neuropathy, unspecified: Secondary | ICD-10-CM | POA: Diagnosis present

## 2024-09-27 DIAGNOSIS — N186 End stage renal disease: Secondary | ICD-10-CM | POA: Diagnosis present

## 2024-09-27 DIAGNOSIS — E1151 Type 2 diabetes mellitus with diabetic peripheral angiopathy without gangrene: Secondary | ICD-10-CM | POA: Diagnosis present

## 2024-09-27 DIAGNOSIS — T451X5A Adverse effect of antineoplastic and immunosuppressive drugs, initial encounter: Secondary | ICD-10-CM | POA: Diagnosis present

## 2024-09-27 DIAGNOSIS — E669 Obesity, unspecified: Secondary | ICD-10-CM | POA: Diagnosis present

## 2024-09-27 DIAGNOSIS — Z86718 Personal history of other venous thrombosis and embolism: Secondary | ICD-10-CM

## 2024-09-27 DIAGNOSIS — R197 Diarrhea, unspecified: Secondary | ICD-10-CM | POA: Diagnosis not present

## 2024-09-27 DIAGNOSIS — R251 Tremor, unspecified: Secondary | ICD-10-CM | POA: Diagnosis not present

## 2024-09-27 DIAGNOSIS — I5022 Chronic systolic (congestive) heart failure: Secondary | ICD-10-CM | POA: Diagnosis present

## 2024-09-27 DIAGNOSIS — R296 Repeated falls: Secondary | ICD-10-CM | POA: Diagnosis present

## 2024-09-27 DIAGNOSIS — Z79890 Hormone replacement therapy (postmenopausal): Secondary | ICD-10-CM

## 2024-09-27 DIAGNOSIS — E872 Acidosis, unspecified: Secondary | ICD-10-CM | POA: Diagnosis present

## 2024-09-27 DIAGNOSIS — Z885 Allergy status to narcotic agent status: Secondary | ICD-10-CM

## 2024-09-27 DIAGNOSIS — R932 Abnormal findings on diagnostic imaging of liver and biliary tract: Secondary | ICD-10-CM | POA: Diagnosis not present

## 2024-09-27 DIAGNOSIS — G4733 Obstructive sleep apnea (adult) (pediatric): Secondary | ICD-10-CM | POA: Diagnosis present

## 2024-09-27 DIAGNOSIS — Z853 Personal history of malignant neoplasm of breast: Secondary | ICD-10-CM

## 2024-09-27 DIAGNOSIS — F411 Generalized anxiety disorder: Secondary | ICD-10-CM

## 2024-09-27 DIAGNOSIS — I132 Hypertensive heart and chronic kidney disease with heart failure and with stage 5 chronic kidney disease, or end stage renal disease: Secondary | ICD-10-CM | POA: Diagnosis present

## 2024-09-27 DIAGNOSIS — I5020 Unspecified systolic (congestive) heart failure: Secondary | ICD-10-CM | POA: Diagnosis not present

## 2024-09-27 DIAGNOSIS — R1319 Other dysphagia: Secondary | ICD-10-CM | POA: Diagnosis present

## 2024-09-27 DIAGNOSIS — F09 Unspecified mental disorder due to known physiological condition: Secondary | ICD-10-CM | POA: Diagnosis not present

## 2024-09-27 DIAGNOSIS — E890 Postprocedural hypothyroidism: Secondary | ICD-10-CM | POA: Diagnosis present

## 2024-09-27 DIAGNOSIS — Z6828 Body mass index (BMI) 28.0-28.9, adult: Secondary | ICD-10-CM

## 2024-09-27 DIAGNOSIS — J45909 Unspecified asthma, uncomplicated: Secondary | ICD-10-CM | POA: Diagnosis present

## 2024-09-27 DIAGNOSIS — Z7982 Long term (current) use of aspirin: Secondary | ICD-10-CM

## 2024-09-27 DIAGNOSIS — R630 Anorexia: Secondary | ICD-10-CM | POA: Diagnosis not present

## 2024-09-27 DIAGNOSIS — Z9221 Personal history of antineoplastic chemotherapy: Secondary | ICD-10-CM

## 2024-09-27 DIAGNOSIS — E1142 Type 2 diabetes mellitus with diabetic polyneuropathy: Secondary | ICD-10-CM | POA: Diagnosis not present

## 2024-09-27 DIAGNOSIS — Z21 Asymptomatic human immunodeficiency virus [HIV] infection status: Secondary | ICD-10-CM | POA: Diagnosis present

## 2024-09-27 DIAGNOSIS — R1314 Dysphagia, pharyngoesophageal phase: Secondary | ICD-10-CM | POA: Diagnosis present

## 2024-09-27 DIAGNOSIS — Z803 Family history of malignant neoplasm of breast: Secondary | ICD-10-CM

## 2024-09-27 LAB — BODY FLUID CULTURE W GRAM STAIN
Culture: NO GROWTH
Gram Stain: NONE SEEN

## 2024-09-27 LAB — GLUCOSE, CAPILLARY
Glucose-Capillary: 109 mg/dL — ABNORMAL HIGH (ref 70–99)
Glucose-Capillary: 135 mg/dL — ABNORMAL HIGH (ref 70–99)
Glucose-Capillary: 149 mg/dL — ABNORMAL HIGH (ref 70–99)
Glucose-Capillary: 154 mg/dL — ABNORMAL HIGH (ref 70–99)
Glucose-Capillary: 83 mg/dL (ref 70–99)
Glucose-Capillary: 85 mg/dL (ref 70–99)

## 2024-09-27 LAB — BASIC METABOLIC PANEL WITH GFR
Anion gap: 17 — ABNORMAL HIGH (ref 5–15)
BUN: 38 mg/dL — ABNORMAL HIGH (ref 6–20)
CO2: 20 mmol/L — ABNORMAL LOW (ref 22–32)
Calcium: 8.3 mg/dL — ABNORMAL LOW (ref 8.9–10.3)
Chloride: 94 mmol/L — ABNORMAL LOW (ref 98–111)
Creatinine, Ser: 11 mg/dL — ABNORMAL HIGH (ref 0.44–1.00)
GFR, Estimated: 4 mL/min — ABNORMAL LOW (ref >60)
Glucose, Bld: 143 mg/dL — ABNORMAL HIGH (ref 70–99)
Potassium: 3.9 mmol/L (ref 3.5–5.1)
Sodium: 131 mmol/L — ABNORMAL LOW (ref 135–145)

## 2024-09-27 LAB — PHOSPHORUS: Phosphorus: 5.7 mg/dL — ABNORMAL HIGH (ref 2.5–4.6)

## 2024-09-27 LAB — CBC
HCT: 27.8 % — ABNORMAL LOW (ref 36.0–46.0)
Hemoglobin: 9.4 g/dL — ABNORMAL LOW (ref 12.0–15.0)
MCH: 36.6 pg — ABNORMAL HIGH (ref 26.0–34.0)
MCHC: 33.8 g/dL (ref 30.0–36.0)
MCV: 108.2 fL — ABNORMAL HIGH (ref 80.0–100.0)
Platelets: 247 K/uL (ref 150–400)
RBC: 2.57 MIL/uL — ABNORMAL LOW (ref 3.87–5.11)
RDW: 15.7 % — ABNORMAL HIGH (ref 11.5–15.5)
WBC: 12.9 K/uL — ABNORMAL HIGH (ref 4.0–10.5)
nRBC: 0.9 % — ABNORMAL HIGH (ref 0.0–0.2)

## 2024-09-27 LAB — MAGNESIUM: Magnesium: 2.3 mg/dL (ref 1.7–2.4)

## 2024-09-27 MED ORDER — EMTRICITABINE-TENOFOVIR AF 200-25 MG PO TABS
1.0000 | ORAL_TABLET | Freq: Every day | ORAL | Status: DC
  Administered 2024-09-28 – 2024-10-08 (×11): 1 via ORAL
  Filled 2024-09-27 (×11): qty 1

## 2024-09-27 MED ORDER — ACETAMINOPHEN 325 MG PO TABS
325.0000 mg | ORAL_TABLET | ORAL | Status: DC | PRN
  Administered 2024-09-28: 650 mg via ORAL
  Filled 2024-09-27: qty 2

## 2024-09-27 MED ORDER — INSULIN ASPART 100 UNIT/ML IJ SOLN: 0.0000 [IU] | Freq: Three times a day (TID) | INTRAMUSCULAR | Status: DC

## 2024-09-27 MED ORDER — RENA-VITE PO TABS
1.0000 | ORAL_TABLET | Freq: Every day | ORAL | Status: DC
  Administered 2024-09-27 – 2024-10-07 (×11): 1 via ORAL
  Filled 2024-09-27 (×11): qty 1

## 2024-09-27 MED ORDER — PROCHLORPERAZINE 25 MG RE SUPP: 12.5000 mg | Freq: Four times a day (QID) | RECTAL | Status: DC | PRN

## 2024-09-27 MED ORDER — ROSUVASTATIN CALCIUM 5 MG PO TABS
10.0000 mg | ORAL_TABLET | Freq: Every day | ORAL | Status: DC
  Administered 2024-09-28 – 2024-10-08 (×11): 10 mg via ORAL
  Filled 2024-09-27 (×11): qty 2

## 2024-09-27 MED ORDER — FLUDROCORTISONE ACETATE 0.1 MG PO TABS
0.1000 mg | ORAL_TABLET | Freq: Every day | ORAL | 0 refills | Status: DC
  Filled 2024-09-27: qty 90, 90d supply, fill #0

## 2024-09-27 MED ORDER — INSULIN ASPART 100 UNIT/ML IJ SOLN
0.0000 [IU] | Freq: Every day | INTRAMUSCULAR | Status: DC
  Administered 2024-09-28: 2 [IU] via SUBCUTANEOUS
  Filled 2024-09-27: qty 2

## 2024-09-27 MED ORDER — METOPROLOL SUCCINATE ER 25 MG PO TB24
12.5000 mg | ORAL_TABLET | Freq: Every day | ORAL | 0 refills | Status: DC
  Filled 2024-09-27: qty 45, 90d supply, fill #0

## 2024-09-27 MED ORDER — CETAPHIL MOISTURIZING EX LOTN: TOPICAL_LOTION | CUTANEOUS | Status: DC | PRN

## 2024-09-27 MED ORDER — BUDESONIDE 0.25 MG/2ML IN SUSP
0.2500 mg | Freq: Two times a day (BID) | RESPIRATORY_TRACT | Status: DC
  Administered 2024-09-27 – 2024-10-08 (×19): 0.25 mg via RESPIRATORY_TRACT
  Filled 2024-09-27 (×18): qty 2

## 2024-09-27 MED ORDER — BUSPIRONE HCL 10 MG PO TABS
10.0000 mg | ORAL_TABLET | Freq: Two times a day (BID) | ORAL | Status: DC | PRN
  Administered 2024-09-28 – 2024-10-01 (×4): 10 mg via ORAL
  Filled 2024-09-27 (×5): qty 1

## 2024-09-27 MED ORDER — DELFLEX-LC/1.5% DEXTROSE 344 MOSM/L IP SOLN: INTRAPERITONEAL | Status: DC

## 2024-09-27 MED ORDER — TRAZODONE HCL 50 MG PO TABS
25.0000 mg | ORAL_TABLET | Freq: Every evening | ORAL | Status: DC | PRN
  Administered 2024-09-28 – 2024-09-29 (×2): 25 mg via ORAL
  Administered 2024-09-30: 21:00:00 50 mg via ORAL
  Filled 2024-09-27 (×4): qty 1

## 2024-09-27 MED ORDER — ENSURE PLUS HIGH PROTEIN PO LIQD
237.0000 mL | Freq: Two times a day (BID) | ORAL | Status: DC
  Administered 2024-09-28 – 2024-10-08 (×9): 237 mL via ORAL

## 2024-09-27 MED ORDER — MIRTAZAPINE 15 MG PO TABS
7.5000 mg | ORAL_TABLET | Freq: Every day | ORAL | Status: DC
  Administered 2024-09-27 – 2024-10-07 (×11): 7.5 mg via ORAL
  Filled 2024-09-27 (×11): qty 1

## 2024-09-27 MED ORDER — HYDROCORTISONE 10 MG PO TABS
ORAL_TABLET | ORAL | 0 refills | Status: DC
  Filled 2024-09-27: qty 240, 80d supply, fill #0

## 2024-09-27 MED ORDER — PANTOPRAZOLE SODIUM 40 MG PO TBEC
40.0000 mg | DELAYED_RELEASE_TABLET | Freq: Every day | ORAL | Status: DC
  Administered 2024-09-28 – 2024-10-08 (×11): 40 mg via ORAL
  Filled 2024-09-27 (×11): qty 1

## 2024-09-27 MED ORDER — GENTAMICIN SULFATE 0.1 % EX CREA
1.0000 | TOPICAL_CREAM | Freq: Every day | CUTANEOUS | Status: DC
  Administered 2024-09-28: 1 via TOPICAL
  Filled 2024-09-27: qty 15

## 2024-09-27 MED ORDER — ASPIRIN 81 MG PO CHEW
81.0000 mg | CHEWABLE_TABLET | Freq: Every day | ORAL | Status: DC
  Administered 2024-09-28 – 2024-10-08 (×11): 81 mg via ORAL
  Filled 2024-09-27 (×11): qty 1

## 2024-09-27 MED ORDER — LIDOCAINE 5 % EX PTCH
1.0000 | MEDICATED_PATCH | CUTANEOUS | Status: DC
  Administered 2024-09-27: 1 via TRANSDERMAL
  Filled 2024-09-27 (×9): qty 1

## 2024-09-27 MED ORDER — DIPHENHYDRAMINE HCL 25 MG PO CAPS: 25.0000 mg | ORAL_CAPSULE | Freq: Four times a day (QID) | ORAL | Status: DC | PRN

## 2024-09-27 MED ORDER — LEVOTHYROXINE SODIUM 75 MCG PO TABS
150.0000 ug | ORAL_TABLET | Freq: Every day | ORAL | Status: DC
  Administered 2024-09-28 – 2024-10-08 (×11): 150 ug via ORAL
  Filled 2024-09-27 (×11): qty 2

## 2024-09-27 MED ORDER — BISACODYL 10 MG RE SUPP: 10.0000 mg | Freq: Every day | RECTAL | Status: DC | PRN

## 2024-09-27 MED ORDER — SODIUM CHLORIDE 0.9% FLUSH
10.0000 mL | INTRAVENOUS | Status: DC | PRN
  Administered 2024-09-27: 10 mL

## 2024-09-27 MED ORDER — ALLOPURINOL 100 MG PO TABS
100.0000 mg | ORAL_TABLET | Freq: Every day | ORAL | Status: DC
  Administered 2024-09-28 – 2024-10-08 (×11): 100 mg via ORAL
  Filled 2024-09-27 (×11): qty 1

## 2024-09-27 MED ORDER — DOLUTEGRAVIR SODIUM 50 MG PO TABS
50.0000 mg | ORAL_TABLET | Freq: Every day | ORAL | Status: DC
  Administered 2024-09-28 – 2024-10-08 (×11): 50 mg via ORAL
  Filled 2024-09-27 (×11): qty 1

## 2024-09-27 MED ORDER — GUAIFENESIN-DM 100-10 MG/5ML PO SYRP: 5.0000 mL | ORAL_SOLUTION | Freq: Four times a day (QID) | ORAL | Status: DC | PRN

## 2024-09-27 MED ORDER — CHLORHEXIDINE GLUCONATE CLOTH 2 % EX PADS: 6.0000 | MEDICATED_PAD | Freq: Every day | CUTANEOUS | Status: DC

## 2024-09-27 MED ORDER — METOPROLOL SUCCINATE ER 25 MG PO TB24
12.5000 mg | ORAL_TABLET | Freq: Every day | ORAL | Status: DC
  Administered 2024-09-28 – 2024-10-08 (×11): 12.5 mg via ORAL
  Filled 2024-09-27 (×11): qty 1

## 2024-09-27 MED ORDER — METOPROLOL SUCCINATE ER 25 MG PO TB24: 25.0000 mg | ORAL_TABLET | Freq: Every day | ORAL | Status: DC

## 2024-09-27 MED ORDER — PROCHLORPERAZINE EDISYLATE 10 MG/2ML IJ SOLN
5.0000 mg | Freq: Four times a day (QID) | INTRAMUSCULAR | Status: DC | PRN
  Administered 2024-09-30 – 2024-10-02 (×3): 10 mg via INTRAVENOUS
  Filled 2024-09-27 (×3): qty 2

## 2024-09-27 MED ORDER — HEPARIN SODIUM (PORCINE) 5000 UNIT/ML IJ SOLN
5000.0000 [IU] | Freq: Three times a day (TID) | INTRAMUSCULAR | Status: DC
  Administered 2024-09-27 – 2024-09-30 (×8): 5000 [IU] via SUBCUTANEOUS
  Filled 2024-09-27 (×7): qty 1

## 2024-09-27 MED ORDER — CALCITRIOL 0.25 MCG PO CAPS
0.2500 ug | ORAL_CAPSULE | ORAL | Status: DC
  Administered 2024-09-30 – 2024-10-07 (×4): 0.25 ug via ORAL
  Filled 2024-09-27 (×4): qty 1

## 2024-09-27 MED ORDER — MILK AND MOLASSES ENEMA: 1.0000 | Freq: Every day | RECTAL | Status: DC | PRN

## 2024-09-27 MED ORDER — OXYCODONE HCL 5 MG PO TABS
5.0000 mg | ORAL_TABLET | Freq: Four times a day (QID) | ORAL | Status: DC | PRN
  Administered 2024-09-27 – 2024-10-08 (×14): 5 mg via ORAL
  Filled 2024-09-27 (×14): qty 1

## 2024-09-27 MED ORDER — PROCHLORPERAZINE MALEATE 5 MG PO TABS
5.0000 mg | ORAL_TABLET | Freq: Four times a day (QID) | ORAL | Status: DC | PRN
  Administered 2024-09-27 – 2024-10-04 (×6): 10 mg via ORAL
  Administered 2024-10-04 – 2024-10-06 (×2): 5 mg via ORAL
  Administered 2024-10-06 – 2024-10-08 (×3): 10 mg via ORAL
  Filled 2024-09-27 (×11): qty 2

## 2024-09-27 MED ORDER — METOPROLOL SUCCINATE ER 25 MG PO TB24: 12.5000 mg | ORAL_TABLET | Freq: Every day | ORAL | Status: DC

## 2024-09-27 MED ORDER — ORAL CARE MOUTH RINSE: 15.0000 mL | OROMUCOSAL | Status: DC | PRN

## 2024-09-27 MED ORDER — INSULIN ASPART 100 UNIT/ML IJ SOLN
0.0000 [IU] | Freq: Three times a day (TID) | INTRAMUSCULAR | Status: DC
  Administered 2024-09-30: 19:00:00 1 [IU] via SUBCUTANEOUS
  Administered 2024-10-07: 4 [IU] via SUBCUTANEOUS
  Filled 2024-09-27: qty 2
  Filled 2024-09-27: qty 4
  Filled 2024-09-27: qty 1

## 2024-09-27 MED ORDER — INSULIN ASPART 100 UNIT/ML IJ SOLN: 0.0000 [IU] | INTRAMUSCULAR | Status: DC

## 2024-09-27 MED ORDER — FLUDROCORTISONE ACETATE 0.1 MG PO TABS
0.1000 mg | ORAL_TABLET | Freq: Every day | ORAL | Status: DC
  Administered 2024-09-28 – 2024-10-08 (×11): 0.1 mg via ORAL
  Filled 2024-09-27 (×11): qty 1

## 2024-09-27 MED ORDER — SIMETHICONE 80 MG PO CHEW: 80.0000 mg | CHEWABLE_TABLET | Freq: Four times a day (QID) | ORAL | Status: DC | PRN

## 2024-09-27 MED ORDER — LORATADINE 10 MG PO TABS
10.0000 mg | ORAL_TABLET | Freq: Every day | ORAL | Status: DC
  Administered 2024-09-27 – 2024-10-07 (×11): 10 mg via ORAL
  Filled 2024-09-27 (×11): qty 1

## 2024-09-27 MED ADMIN — Hydrocortisone Sodium Succinate PF For Inj 100 MG: 50 mg | INTRAVENOUS | NDC 00009001103

## 2024-09-27 MED FILL — Hydrocortisone Sodium Succinate PF For Inj 100 MG: 50.0000 mg | INTRAMUSCULAR | Qty: 1 | Status: AC

## 2024-09-27 NOTE — Procedures (Signed)
 PD post treatment note  PD treatment completed. Patient tolerated treatment well. PD effluent is clear. No specimen collected.  PD exit site dressing clean, dry, and intact. Patient is awake, oriented and in no acute distress.   Report given to bedside nurse.   Total UF removed:  117 ml

## 2024-09-27 NOTE — TOC Transition Note (Signed)
 Transition of Care (TOC) - Discharge Note Rayfield Gobble RN, BSN Inpatient Care Management Unit 4E- RN Case Manager See Treatment Team for direct phone #   Patient Details  Name: [name] MRN: [id number] Date of Birth: 06/06/1966  Transition [facility] Care [facility]) CM/SW Contact:  Gobble Rayfield Hurst, RN Phone Number: 09/27/2024, 3:45 PM   Clinical Narrative:    Notified by CIR liaison that insurance shara has been received and they have bed available to admit today.  MD has cleared pt for transition to [facility] INPT rehab.   No further IP CM needs noted. Pt from home and on Peritoneal Dialysis.    Final next level of care: IP Rehab Facility Barriers to Discharge: Barriers Resolved, Insurance Authorization   Patient Goals and CMS Choice Patient states their goals for this hospitalization and ongoing recovery are:: rehab then be able to return home   Choice offered to / list presented to : Patient      Discharge Placement                 [facility] INPT rehab      Discharge Plan and Services Additional resources added to the After Visit Summary for   In-house Referral: Clinical Social Work Discharge Planning Services: CM Consult Post Acute Care Choice: IP Rehab          DME Arranged: N/A DME Agency: NA       HH Arranged: NA HH Agency: NA        Social Drivers of Health (SDOH) Interventions SDOH Screenings   Food Insecurity: No Food Insecurity (09/26/2024)  Housing: Low Risk (09/26/2024)  Transportation Needs: No Transportation Needs (09/26/2024)  Utilities: Not At Risk (09/26/2024)  Alcohol  Screen: Low Risk (06/27/2024)  Depression (PHQ2-9): Low Risk (09/20/2024)  Financial Resource Strain: Medium Risk (06/27/2024)  Physical Activity: Inactive (06/27/2024)  Social Connections: Unknown (06/27/2024)  Stress: Stress Concern Present (06/27/2024)  Tobacco Use: Medium Risk (09/23/2024)  Health Literacy: Adequate Health Literacy (06/27/2024)     Readmission Risk  Interventions    09/27/2024    3:45 PM 09/12/2024    9:43 AM 02/21/2023    4:05 PM  Readmission Risk Prevention Plan  Transportation Screening Complete Complete Complete  PCP or Specialist Appt within 3-5 Days  Complete   HRI or Home Care Consult  Complete   Social Work Consult for Recovery Care Planning/Counseling  Complete   Palliative Care Screening  Not Applicable   Medication Review Oceanographer) Complete Referral to Pharmacy Complete  PCP or Specialist appointment within 3-5 days of discharge Complete  Complete  HRI or Home Care Consult Complete  Complete  SW Recovery Care/Counseling Consult Complete  Complete  Palliative Care Screening Not Applicable  Not Applicable  Skilled Nursing Facility Not Applicable  Not Applicable

## 2024-09-27 NOTE — Discharge Summary (Signed)
 Physician Discharge Summary  Adali Pennings FMW:[id number] DOB: 1966/03/30 DOA: 09/23/2024  PCP: Gretta Comer POUR, NP  Admit date: 09/23/2024 Discharge date: 09/27/2024  Admitted From: Home Disposition: Rehab  Recommendations for Outpatient Follow-up:  Follow up with PCP in 1-2 weeks Follow-up with nephrology as scheduled:  Discharge Condition: Stable CODE STATUS: Full Diet recommendation: Low-salt low-fat renal diet  Brief/Interim Summary: Ms. Paradise is a 58 year old female with history of ESRD on PD, anemia, anxiety, asthma, T2DM, aortic dissection s/p endovascular repair, GERD, Grave's disease, HTN, HLD, papillary thyroid  cancer s/p thyroidectomy (2018) PVD, falls, sarcoidosis, suppurative hydradenitis who presents from dialysis with worsening back pain abdominal pain dyspnea found to be profoundly hypotensive despite IV fluids. CTA CAP showed stable thoracoabdominal aortic aneurysm and dissection with no significant change in the endoluminal stent graft, no evidence of aortic leak or rupture and persistent pneumoperitoneum and pelvic free fluid in the setting of peritoneal dialysis catheter.    Recently admitted to our facility late November for intractable nausea vomiting diarrhea treated empirically for C. difficile at that time.  Patient presented to our facility with constellation of complaints including low back pain, abdominal pain, dyspnea with profound hypotension.  Patient initially evaluated for septic etiology given profound hypotension with recent diagnosis of C. difficile on antibiotics.  Fortunately patient's infectious workup remains negative, infectious disease consulted and have discontinued antibiotics.  In light of patient's recently diagnosed pituitary apoplexy it appears patient is suffering from adrenal insufficiency and with supplementation of steroids she has improved drastically over the past 48 hours.  Patient no longer requiring pressors, continued on home  Synthroid  given normal TSH but notably cortisol less than 10 in the morning (ACTH  stim test unreliable given recent apoplexy) -Will continue hydrocortisone  and fludrocortisone  as below.  Patient had left and right heart cath on 12/11 with clean coronaries which is reassuring given her recent hypotension.  At this time patient is otherwise stable and agreeable for discharge to rehab facility.  Given negative infectious workup, improvement with steroid supplementation there is no further indication for antibiotics or inpatient workup at this time.   Discharge Diagnoses:  Principal Problem:   Shock (HCC) Active Problems:   Malnutrition of moderate degree   Debility   Hereditary and idiopathic peripheral neuropathy  Adrenal insufficiency History of pituitary apoplexy History of papillary thyroid  cancer s/p thyroidectomy - Recent pituitary apoplexy makes ACTH  stim test unreliable - Morning cortisol is <10.  -TSH normal -continue home Synthroid  dose - Empiric hydrocortisone  and fludrocortisone  ongoing since 12/9 -transition to p.o. as above - Shock resolved with initiation of steroids - Antibiotics discontinued  - Cultures remain negative   HFrEF, without acute exacerbation - Echocardiogram 12/9 with EF of 30 to 35% with LV wall motion abnormality and grade 2 diastolic dysfunction.  Drop in EF and WMA are both new compared to prior echoes - Cardiology following, left right heart cath 09/26/2024 with normal coronary arteries - Volume status managed by dialysis - Heart healthy diet; GDMT as able to tolerate   Elevated troponin - Heart cath with clean coronary arteries   Unlikely PD site infection  - Low suspicion - culture remains negative - Antibiotics discontinued, follow clinically   Subacute low back pain Subacute abdominal pain  - Reportedly low back pain present since dissection repair.  - History of aortic dissection - negative for any changes on repeat imaging.  - MRI T and L  spine negative.   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 CTA with stable dissection and endoluminal stent graft, no leak or rupture. Vascular indicates no further need for imaging intervention or evaluation -continue medical support   ESRD Hypokalemia Hypocalcemia Hypomagnesemia  - Nephrology consulted, appreciate insight recommendations - Continue nightly PD per nephrology   Chronic nausea/vomiting, stable Moderate malnutrition Decreased appetite Hypoalbuminemia - RD and SLP following -Continue appropriate diet, Remeron    History of HIV -CD4 445 on 10/15.  - Continue HAART   Anemia, likely anemia of chronic disease - Follow clinically, no signs symptoms of bleeding   History of T2DM Continue SSI, hypoglycemic protocol   Deconditioning, ambulatory dysfunction, weakness PT OT following, current plan for discharge to rehab facility as above  Discharge Instructions  Discharge Instructions     (HEART FAILURE PATIENTS) Call MD:  Anytime you have any of the following symptoms: 1) 3 pound weight gain in 24 hours or 5 pounds in 1 week 2) shortness of breath, with or without a dry hacking cough 3) swelling in the hands, feet or stomach 4) if you have to sleep on extra pillows at night in order to breathe.   Complete by: As directed    Call MD for:  difficulty breathing, headache or visual disturbances   Complete by: As directed    Call MD for:  extreme fatigue   Complete by: As directed    Call MD for:  hives   Complete by: As directed    Call MD for:  persistant dizziness or light-headedness   Complete by: As directed    Call MD for:  persistant nausea and vomiting   Complete by: As directed    Call MD for:  redness, tenderness, or signs of infection (pain, swelling, redness, odor or green/yellow discharge around incision site)   Complete  by: As directed    Call MD for:  severe uncontrolled pain   Complete by: As directed    Call MD for:  temperature >100.4   Complete by: As directed    Discharge wound care:   Complete by: As directed    Clean skin near exit site with chloraprep swab sticks.  Starting at catheter, use circular pattern around exit site, moving towards outer edges of area covered by dressing.  Apply gentamicin  cream to site once daily.  Cover with dry dressing.   Increase activity slowly   Complete by: As directed       Allergies as of 09/27/2024       Reactions   Genvoya [elviteg-cobic-emtricit-tenofaf] Hives   Lisinopril Cough   Aldactone  [spironolactone ] Hives   Valium  [diazepam ] Other (See Comments)   Lethargy (pt states she cannot take this again)   Tegaderm Ag Mesh [silver] Itching        Medication List     TAKE these medications    acetaminophen  500 MG tablet Commonly known as: TYLENOL  Take 1,000 mg by mouth every 6 (six) hours as needed for mild pain.   allopurinol  100 MG tablet Commonly known as: ZYLOPRIM  TAKE 1 TABLET (100 MG TOTAL) BY MOUTH DAILY. FOR GOUT PREVENTION   aspirin  81 MG chewable tablet Chew 81 mg by mouth daily.   Auryxia  1 GM 210 MG(Fe) tablet Generic drug: ferric citrate  Take 210 mg by mouth 3 (three) times daily with meals.   busPIRone  5 MG tablet Commonly known as: BUSPAR  TAKE 1 TABLET BY MOUTH TWICE A  DAY FOR ANXIETY   calcitRIOL  0.25 MCG capsule Commonly known as: ROCALTROL  Take 0.25 mcg by mouth 3 (three) times a week.   dolutegravir  50 MG tablet Commonly known as: [name]  Take 1 tablet (50 mg total) by mouth daily.   emtricitabine -tenofovir  AF 200-25 MG tablet Commonly known as: DESCOVY  Take 1 tablet by mouth daily.   fludrocortisone  0.1 MG tablet Commonly known as: FLORINEF  Take 1 tablet (0.1 mg total) by mouth daily. Start taking on: September 28, 2024   fluticasone  110 MCG/ACT inhaler Commonly known as: FLOVENT  HFA Inhale 1 puff  into the lungs 2 (two) times daily.   hydrocortisone  20 MG tablet Commonly known as: CORTEF  Take 1 tablet (20 mg total) by mouth in the morning AND 0.5 tablets (10 mg total) every evening.   levocetirizine 5 MG tablet Commonly known as: XYZAL  TAKE 1 TABLET BY MOUTH EVERY DAY IN THE EVENING FOR ALLERGIES   levothyroxine  150 MCG tablet Commonly known as: SYNTHROID  TAKE 1 TABLET BY MOUTH EVERY DAY BEFORE BREAKFAST   lidocaine  5 % Commonly known as: LIDODERM  PLACE 1 PATCH ONTO THE SKIN DAILY. REMOVE & DISCARD PATCH WITHIN 12 HOURS OR AS DIRECTED BY MD What changed:  when to take this reasons to take this   metoprolol  succinate 25 MG 24 hr tablet Commonly known as: TOPROL -XL Take 0.5 tablets (12.5 mg total) by mouth daily.   minocycline  100 MG capsule Commonly known as: Minocin  Take 1 capsule (100 mg total) by mouth 2 (two) times daily.   omeprazole  40 MG capsule Commonly known as: PRILOSEC Take 1 capsule (40 mg total) by mouth 2 (two) times daily. for heartburn.   ondansetron  4 MG disintegrating tablet Commonly known as: ZOFRAN -ODT Take 1 tablet (4 mg total) by mouth every 8 (eight) hours as needed for nausea or vomiting.   onetouch ultrasoft lancets Use as instructed to test blood sugar daily   rosuvastatin  10 MG tablet Commonly known as: CRESTOR  TAKE 1 TABLET BY MOUTH EVERY DAY FOR CHOLESTEROL   sevelamer  carbonate 800 MG tablet Commonly known as: RENVELA  Take 800 mg by mouth 3 (three) times daily with meals.   tiZANidine  4 MG tablet Commonly known as: Zanaflex  Take 1-2 tablets (4-8 mg total) by mouth every 8 (eight) hours as needed for muscle spasms.               Discharge Care Instructions  (From admission, onward)           Start     Ordered   09/27/24 0000  Discharge wound care:       Comments: Clean skin near exit site with chloraprep swab sticks.  Starting at catheter, use circular pattern around exit site, moving towards outer edges of area  covered by dressing.  Apply gentamicin  cream to site once daily.  Cover with dry dressing.   09/27/24 1553            Allergies[1]  Consultations: PCCM, nephrology, cardiology   Procedures/Studies: CARDIAC CATHETERIZATION Result Date: 09/26/2024 1.  Tortuous but normal coronary arteries. 2.  Left ventricular angiography was not performed.  EF was severely reduced by echo. 3.  Right heart catheterization showed normal right atrial pressure, mild pulmonary hypertension, mildly elevated wedge pressure and normal cardiac output RA: 5 mmHg RV: 37/8 mmHg PW: 18 mmHg PA: 37/18 with a mean of 23 mmHg Cardiac output/index: 5.59/2.94. Recommendations: Medical therapy for nonischemic cardiomyopathy.   US  Abdomen Limited RUQ (LIVER/GB) Result Date: 09/24/2024 EXAM: Right Upper Quadrant Abdominal  Ultrasound 09/24/2024 03:50:43 PM TECHNIQUE: Real-time ultrasonography of the right upper quadrant of the abdomen was performed. COMPARISON: None available. CLINICAL HISTORY: Abdominal pain FINDINGS: LIVER: The liver demonstrates normal echogenicity. No intrahepatic biliary ductal dilatation. No evidence of mass. BILIARY SYSTEM: No pericholecystic fluid. Cholesterolosis of the gallbladder wall, suggesting Adenomyomatosis. Layering 16 mm gallstone. Common bile duct is within normal limits measuring 3 mm. OTHER: No right upper quadrant ascites. IMPRESSION: 1. Cholelithiasis without sonographic findings to suggest acute cholecystitis. 2. Gallbladder adenomyomatosis, benign. Electronically signed by: Pinkie Pebbles MD 09/24/2024 07:22 PM EST RP Workstation: [id number]   VAS US  LOWER EXTREMITY VENOUS (DVT) Result Date: 09/24/2024  Lower Venous DVT Study Patient Name:  JURI DINNING [facility]  Date of Exam:   09/24/2024 Medical Rec #: [id number]              Accession #:    [id number] Date of Birth: 10/04/1966              Patient Gender: F Patient Age:   58 years Exam Location:  [facility] Procedure:      VAS US   LOWER EXTREMITY VENOUS (DVT) Referring Phys: TORIBIO SHARPS --------------------------------------------------------------------------------  Indications: Edema. Other Indications: Anemia, H/O thyroid  cancer. Comparison Study: No prior exam. Performing Technologist: Edilia Elden Appl  Examination Guidelines: A complete evaluation includes B-mode imaging, spectral Doppler, color Doppler, and power Doppler as needed of all accessible portions of each vessel. Bilateral testing is considered an integral part of a complete examination. Limited examinations for reoccurring indications may be performed as noted. The reflux portion of the exam is performed with the patient in reverse Trendelenburg.  +---------+---------------+---------+-----------+----------+--------------+ RIGHT    CompressibilityPhasicitySpontaneityPropertiesThrombus Aging +---------+---------------+---------+-----------+----------+--------------+ CFV      Full           Yes      Yes                                 +---------+---------------+---------+-----------+----------+--------------+ SFJ      Full           Yes      Yes                                 +---------+---------------+---------+-----------+----------+--------------+ FV Prox  Full                                                        +---------+---------------+---------+-----------+----------+--------------+ FV Mid   Full                                                        +---------+---------------+---------+-----------+----------+--------------+ FV DistalFull                                                        +---------+---------------+---------+-----------+----------+--------------+ PFV      Full                                                        +---------+---------------+---------+-----------+----------+--------------+  POP      Full           Yes      Yes                                  +---------+---------------+---------+-----------+----------+--------------+ PTV      Full                                                        +---------+---------------+---------+-----------+----------+--------------+ PERO     Full                                                        +---------+---------------+---------+-----------+----------+--------------+   +---------+---------------+---------+-----------+----------+--------------+ LEFT     CompressibilityPhasicitySpontaneityPropertiesThrombus Aging +---------+---------------+---------+-----------+----------+--------------+ CFV      Full           Yes      Yes                                 +---------+---------------+---------+-----------+----------+--------------+ SFJ      Full           Yes      Yes                                 +---------+---------------+---------+-----------+----------+--------------+ FV Prox  Full                                                        +---------+---------------+---------+-----------+----------+--------------+ FV Mid   Full                                                        +---------+---------------+---------+-----------+----------+--------------+ FV DistalFull                                                        +---------+---------------+---------+-----------+----------+--------------+ PFV      Full                                                        +---------+---------------+---------+-----------+----------+--------------+ POP      Full           Yes      Yes                                 +---------+---------------+---------+-----------+----------+--------------+  PTV      Full                                                        +---------+---------------+---------+-----------+----------+--------------+ PERO     Full                                                         +---------+---------------+---------+-----------+----------+--------------+     Summary: BILATERAL: - No evidence of deep vein thrombosis seen in the lower extremities, bilaterally. -No evidence of popliteal cyst, bilaterally.   *See table(s) above for measurements and observations. Electronically signed by Norman Serve on 09/24/2024 at 3:43:33 PM.    Final    ECHOCARDIOGRAM COMPLETE Result Date: 09/24/2024    ECHOCARDIOGRAM REPORT   Patient Name:   RAFAELITA FOISTER [facility] Date of Exam: 09/24/2024 Medical Rec #:  [id number]             Height:       65.0 in Accession #:    [id number]            Weight:       176.6 lb Date of Birth:  1966/02/05             BSA:          1.876 m Patient Age:    58 years              BP:           128/58 mmHg Patient Gender: F                     HR:           68 bpm. Exam Location:  Inpatient Procedure: 2D Echo, Cardiac Doppler, Color Doppler and Intracardiac            Opacification Agent (Both Spectral and Color Flow Doppler were            utilized during procedure). Indications:    Shock  History:        Patient has prior history of Echocardiogram examinations, most                 recent 01/30/2023. Risk Factors:Hypertension, Diabetes, Former                 Smoker and Dyslipidemia.  Sonographer:    Juliene Rucks Referring Phys: [id number] TORIBIO JAYSON SHARPS  Sonographer Comments: Patient is obese. IMPRESSIONS  1. Left ventricular ejection fraction, by estimation, is 30 to 35%. The left ventricle has moderately decreased function. The left ventricle demonstrates regional wall motion abnormalities (see scoring diagram/findings for description). There is mild concentric left ventricular hypertrophy. Left ventricular diastolic parameters are consistent with Grade II diastolic dysfunction (pseudonormalization). Elevated left ventricular end-diastolic pressure.  2. Right ventricular systolic function is normal. The right ventricular size is normal.  3. Left atrial size was severely dilated.  4.  The mitral valve is normal in structure. Moderate to severe mitral valve regurgitation. No evidence of mitral stenosis.  5. The aortic valve is normal in structure. Aortic valve regurgitation is not visualized. No aortic stenosis is present.  6. The inferior vena cava is normal in size with greater than 50% respiratory variability, suggesting right atrial pressure of 3 mmHg. FINDINGS  Left Ventricle: Left ventricular ejection fraction, by estimation, is 30 to 35%. The left ventricle has moderately decreased function. The left ventricle demonstrates regional wall motion abnormalities. Definity  contrast agent was given IV to delineate the left ventricular endocardial borders. The left ventricular internal cavity size was normal in size. There is mild concentric left ventricular hypertrophy. Left ventricular diastolic parameters are consistent with Grade II diastolic dysfunction (pseudonormalization). Elevated left ventricular end-diastolic pressure.  LV Wall Scoring: The entire apex is akinetic. The anterior wall, antero-lateral wall, anterior septum, inferior wall, posterior wall, mid inferoseptal segment, and basal inferoseptal segment are hypokinetic. Right Ventricle: The right ventricular size is normal. No increase in right ventricular wall thickness. Right ventricular systolic function is normal. Left Atrium: Left atrial size was severely dilated. Right Atrium: Right atrial size was normal in size. Pericardium: Trivial pericardial effusion is present. The pericardial effusion is circumferential. Mitral Valve: The mitral valve is normal in structure. Moderate to severe mitral valve regurgitation. No evidence of mitral valve stenosis. Tricuspid Valve: The tricuspid valve is normal in structure. Tricuspid valve regurgitation is not demonstrated. No evidence of tricuspid stenosis. Aortic Valve: The aortic valve is normal in structure. Aortic valve regurgitation is not visualized. No aortic stenosis is present.  Pulmonic Valve: The pulmonic valve was normal in structure. Pulmonic valve regurgitation is not visualized. No evidence of pulmonic stenosis. Aorta: The aortic root is normal in size and structure. Venous: The inferior vena cava is normal in size with greater than 50% respiratory variability, suggesting right atrial pressure of 3 mmHg. IAS/Shunts: No atrial level shunt detected by color flow Doppler.  LEFT VENTRICLE PLAX 2D LVIDd:         5.20 cm      Diastology LVIDs:         3.80 cm      LV e' medial:    4.90 cm/s LV PW:         1.10 cm      LV E/e' medial:  22.7 LV IVS:        1.00 cm      LV e' lateral:   7.72 cm/s LVOT diam:     2.10 cm      LV E/e' lateral: 14.4 LV SV:         76 LV SV Index:   40 LVOT Area:     3.46 cm  LV Volumes (MOD) LV vol d, MOD A2C: 222.0 ml LV vol d, MOD A4C: 268.0 ml LV vol s, MOD A2C: 158.0 ml LV vol s, MOD A4C: 197.0 ml LV SV MOD A2C:     64.0 ml LV SV MOD A4C:     268.0 ml LV SV MOD BP:      68.3 ml RIGHT VENTRICLE             IVC RV Basal diam:  3.60 cm     IVC diam: 1.80 cm RV Mid diam:    2.50 cm RV S prime:     11.00 cm/s TAPSE (M-mode): 1.7 cm LEFT ATRIUM             Index        RIGHT ATRIUM           Index LA diam:        4.10 cm 2.19 cm/m   RA Area:  13.30 cm LA Vol (A2C):   78.1 ml 41.63 ml/m  RA Volume:   29.40 ml  15.67 ml/m LA Vol (A4C):   95.1 ml 50.69 ml/m LA Biplane Vol: 91.2 ml 48.61 ml/m  AORTIC VALVE LVOT Vmax:   107.00 cm/s LVOT Vmean:  69.800 cm/s LVOT VTI:    0.219 m  AORTA Ao Root diam: 2.90 cm MITRAL VALVE                TRICUSPID VALVE MV Area (PHT): 3.77 cm     TR Peak grad:   13.7 mmHg MV Decel Time: 201 msec     TR Vmax:        185.00 cm/s MR Peak grad: 115.8 mmHg MR Vmax:      538.00 cm/s   SHUNTS MV E velocity: 111.00 cm/s  Systemic VTI:  0.22 m MV A velocity: 101.00 cm/s  Systemic Diam: 2.10 cm MV E/A ratio:  1.10 Kardie Tobb DO Electronically signed by Dub Huntsman DO Signature Date/Time: 09/24/2024/1:41:17 PM    Final    MR LUMBAR SPINE WO  CONTRAST Result Date: 09/23/2024 EXAM: MRI LUMBAR SPINE 09/23/2024 11:15:00 PM TECHNIQUE: Multiplanar multisequence MRI of the lumbar spine was performed without the administration of intravenous contrast. COMPARISON: MRI lumbar spine 06/06/2024 CLINICAL HISTORY: Lumbar radiculopathy, symptoms persist with > 6 wks treatment FINDINGS: BONES AND ALIGNMENT: Normal alignment. Normal vertebral body heights. 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 Bone marrow signal is unremarkable. SPINAL CORD: The conus terminates normally. SOFT TISSUES: Aortic aneurysm better characterized on CT chest from today. L1-L2: No significant disc herniation. No spinal canal stenosis or neural foraminal narrowing. L2-L3: No significant disc herniation. No spinal canal stenosis or neural foraminal narrowing. L3-L4: No significant disc herniation. No spinal canal stenosis or neural foraminal narrowing. L4-L5: No significant disc herniation. Facet arthropathy. No spinal canal stenosis or neural foraminal narrowing. L5-S1: No significant disc herniation. Facet arthropathy. No spinal canal stenosis or neural foraminal narrowing. IMPRESSION: 1. No spinal canal stenosis or neural foraminal narrowing in the lumbar spine. 2. 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 Electronically signed by: Gilmore Molt MD 09/23/2024 11:46 PM EST RP Workstation: [id number]   MR THORACIC SPINE WO CONTRAST Result Date: 09/23/2024 EXAM: MRI Thoracic Spine Without Intravenous Contrast 09/23/2024 11:14:00 PM TECHNIQUE: Multiplanar multisequence MRI of the thoracic spine was performed without the administration of intravenous contrast. COMPARISON: None available. CLINICAL HISTORY: Myelopathy, chronic, thoracic spine FINDINGS: BONES AND ALIGNMENT: Normal alignment. Normal vertebral body heights. Bone marrow signal is unremarkable. No abnormal enhancement. SPINAL CORD: Normal spinal cord volume. Normal spinal cord signal. SOFT  TISSUES: Aortic aneurysm characterized on same day CT chest. DEGENERATIVE CHANGES: No significant disc herniation. No spinal canal stenosis or neural foraminal narrowing. IMPRESSION: 1. No spinal canal stenosis or neural foraminal narrowing in the thoracic spine. 2. Aortic aneurysm characterized on same day CT chest. Electronically signed by: Gilmore Molt MD 09/23/2024 11:43 PM EST RP Workstation: [id number]   CT Angio Chest/Abd/Pel for Dissection W and/or Wo Contrast Result Date: 09/23/2024 CLINICAL DATA:  Acute aortic syndrome suspected, hypotension, altered level of consciousness EXAM: CT ANGIOGRAPHY CHEST, ABDOMEN AND PELVIS TECHNIQUE: Non-contrast CT of the chest was initially obtained. Multidetector CT imaging through the chest, abdomen and pelvis was performed using the standard protocol during bolus administration of intravenous contrast. Multiplanar reconstructed images and MIPs were obtained and reviewed to evaluate the vascular anatomy. RADIATION DOSE REDUCTION: This exam was performed according to the departmental dose-optimization program which includes  automated exposure control, adjustment of the mA and/or kV according to patient size and/or use of iterative reconstruction technique. 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 The ascending thoracic aorta measures up to 3.0 cm, mid aortic arch measures up to 4.4 cm, and descending thoracic aorta measures up to 5.0 cm in maximal dimension, similar to prior study. The graft extends to the level of the diaphragmatic hiatus, with no evidence of endoleak involving the thoracic aorta. The common trunk of the great vessels off the anterior margin of the ascending thoracic aorta is again widely patent, and the visualized portions of the  great vessels are patent. The heart is unremarkable without pericardial effusion. Timing of contrast bolus limits evaluation of the pulmonary vasculature. Mediastinum/Nodes: Prior thyroidectomy. Trachea and esophagus appear unremarkable. Lungs/Pleura: Stable areas of scarring and subsegmental atelectasis, primarily at the lung bases. No acute airspace disease, effusion, or pneumothorax. Musculoskeletal: No acute or destructive bony abnormalities. Reconstructed images demonstrate no additional findings. Review of the MIP images confirms the above findings. CTA ABDOMEN AND PELVIS FINDINGS VASCULAR Aorta: The endoluminal stent graft terminates at the level of the diaphragmatic hiatus. Fusiform abdominal aortic aneurysm is seen throughout the entirety of the abdominal aorta, measuring up to 4.3 cm at the level of the celiac axis, similar to prior study. Significant thrombus within the false lumen of the known abdominal aortic dissection is again seen, with perfusion likely due to small fenestrations as well as vascular supply from small lumbar arteries. This is similar to prior exam allowing for differences in the timing of the contrast bolus. No evidence of aortic leak or rupture. Celiac: Patent without evidence of aneurysm, dissection, vasculitis or significant stenosis. Vessel arises from the true lumen. SMA: Patent without evidence of aneurysm, dissection, vasculitis or significant stenosis. Vessel arises from the true lumen. Renals: There is diffuse atherosclerosis of the bilateral renal arteries, with stable moderate stenosis of the proximal renal arteries bilaterally estimated at 50-70%. No aneurysm, dissection, or vasculitis. IMA: Patent without evidence of aneurysm, dissection, vasculitis or significant stenosis. Arises from the false lumen. Inflow: The aortic dissection extends into the left common iliac and external iliac arteries, terminating at the left common femoral artery. This is similar to previous  exam. The thrombosed false lumen of the left external iliac artery results in significant stenosis, estimated 70-90%. The right common iliac, external iliac, and internal iliac vessels are widely patent. Veins: No obvious venous abnormality within the limitations of this arterial phase study. Review of the MIP images confirms the above findings. NON-VASCULAR Hepatobiliary: No focal liver abnormality is seen. No gallstones, gallbladder wall thickening, or biliary dilatation. Pancreas: Unremarkable. No pancreatic ductal dilatation or surrounding inflammatory changes. Spleen: Normal in size without focal abnormality. Adrenals/Urinary Tract: Stable thickening the adrenal glands. Bilateral renal cortical atrophy. Nonobstructing 8 mm calculus lower pole right kidney. Remaining calcifications at the renal hila are felt to be vascular. No obstructive uropathy. Bladder is decompressed. Stomach/Bowel: No bowel obstruction or ileus. Normal appendix right lower quadrant. No bowel wall thickening or inflammatory change. Lymphatic: No pathologic adenopathy. Reproductive: Uterus and bilateral adnexa are unremarkable. Other: Small amount of pelvic free fluid is well as trace pneumoperitoneum within the right upper quadrant are likely secondary to indwelling peritoneal dialysis catheter, which is coiled in the right hemipelvis. No abdominal wall hernia. Musculoskeletal: No acute or destructive  bony abnormalities. Reconstructed images demonstrate no additional findings. Review of the MIP images confirms the above findings. IMPRESSION: Vascular: 1. Stable thoracoabdominal aortic aneurysm and dissection, with no significant change in the endoluminal stent graft seen within the thoracic aorta on prior study. 2. No evidence of aortic leak or rupture. 3. Stable stenoses within the bilateral renal arteries and left external iliac artery. 4.  Aortic Atherosclerosis (ICD10-I70.0). Nonvascular: 1. Persistent pneumoperitoneum and pelvic free  fluid, likely due to indwelling peritoneal dialysis catheter which is coiled in the right hemipelvis. 2. Nonobstructing right renal calculus. Electronically Signed   By: Ozell Daring M.D.   On: 09/23/2024 15:13   DG Chest Port 1 View Result Date: 09/23/2024 CLINICAL DATA:  Questionable sepsis.  Evaluate for an abnormality. EXAM: PORTABLE CHEST 1 VIEW COMPARISON:  09/07/2024 FINDINGS: Again noted is a thoracic aortic stent graft. Heart size is stable and within normal limits. Median sternotomy wires. Lungs are clear without focal airspace disease or pulmonary edema. IMPRESSION: No active disease. Electronically Signed   By: Juliene Balder M.D.   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 She reported dysphagia and globus sensation during meals leading to request for esophagram. EXAM: ESOPHAGUS/BARIUM SWALLOW/TABLET STUDY TECHNIQUE: Single contrast examination was performed using thin liquid barium. This exam was performed by Brittany Huneycutt, NP, and was supervised and interpreted by Dr. Rockey Kilts. FLUOROSCOPY: Radiation Exposure Index (as provided by the fluoroscopic device): 38.3 mGy Kerma COMPARISON:  None Available. FINDINGS: Swallowing: Appears normal. No vestibular penetration or aspiration seen. Pharynx: Unremarkable. Esophagus: Normal appearance. Esophageal motility: Mild esophageal dysmotility seen as tertiary contractions within the mid esophagus, best seen in series #2. Hiatal Hernia: Absent Gastroesophageal reflux: None visualized. Ingested 13 mm barium tablet: The tablet was delayed within the mid esophagus. This passed to the distal esophagus with subsequent sips and remained at the GE  junction despite providing additional fluids and applesauce Other: Radiopaque bodies consistent with above mentioned surgical history. 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 Mild delayed passage of a 13 mm barium tablet, likely secondary to dysmotility. Electronically Signed   By: Rockey Kilts M.D.   On: 09/09/2024 15:27   CT ABDOMEN WO CONTRAST Result Date: 09/07/2024 EXAM: CT ABDOMEN WITHOUT CONTRAST 09/07/2024 10:05:51 PM TECHNIQUE: CT of the abdomen was performed without the administration of intravenous contrast. Multiplanar reformatted images are provided for review. Automated exposure control, iterative reconstruction, and/or weight based adjustment of the mA/kV was utilized to reduce the radiation dose to as low as reasonably achievable. COMPARISON: 07/18/2024 CLINICAL HISTORY: Epigastric pain; Diffuse abd pain, N/V/D. FINDINGS: LOWER CHEST: Visualized portion of the lower chest demonstrates no acute abnormality. HEPATOBILIARY: The visualized liver is unremarkable. 1 cm gallstone layering within the gallbladder. SPLEEN: Spleen demonstrates no acute abnormality. PANCREAS: Pancreas demonstrates no acute abnormality. ADRENAL GLANDS: Diffuse enlargement of the left adrenal gland compatible with hyperplasia, stable. KIDNEYS: No stones in the kidneys or proximal ureters. No hydronephrosis. No perinephric or periureteral stranding. GI AND BOWEL: Stomach and duodenal sweep demonstrate no acute abnormality. There is no bowel obstruction. No abnormal bowel wall thickening or distension. PERITONEUM AND RETROPERITONEUM: Small amount of ascites adjacent to the liver and spleen. Stent graft repair of aortic dissection.  Stent graft extends into the upper abdominal aorta. Dissection continues throughout the abdominal aorta, unchanged since prior study . No free air. LYMPH NODES: No lymphadenopathy. BONES AND SOFT TISSUES: No acute abnormality of the visualized bones. No focal soft tissue  abnormality. IMPRESSION: 1. Stent graft repair of aortic dissection with continued dissection throughout the abdominal aorta, stable since prior study . 2. Small amount of ascites adjacent to the liver and spleen. Electronically signed by: Franky Crease MD 09/07/2024 10:19 PM EST RP Workstation: [id number]   DG Chest Portable 1 View Result Date: 09/07/2024 EXAM: 1 VIEW(S) XRAY OF THE CHEST 09/07/2024 08:40:00 PM COMPARISON: None available. CLINICAL HISTORY: SOB FINDINGS: LUNGS AND PLEURA: No vascular congestion, edema, or consolidation. No pleural effusion or pneumothorax. HEART AND MEDIASTINUM: Postoperative changes in the mediastinum. Thoracic aortic stent in place. Mild cardiac enlargement. BONES AND SOFT TISSUES: Surgical clips in the base of the neck. No acute osseous abnormality. IMPRESSION: 1. No acute cardiopulmonary process. 2. Mild cardiomegaly. Electronically signed by: Elsie Gravely MD 09/07/2024 08:47 PM EST RP Workstation: [id number]     Subjective: No acute issues or events overnight, feeling quite improved from previous still somewhat lethargic from baseline feeling slow but optimistic in her improvement.   Discharge Exam: Vitals:   09/27/24 0807 09/27/24 1101  BP:  136/77  Pulse:  82  Resp:  16  Temp:  97.8 F (36.6 C)  SpO2: 99% 100%   Vitals:   09/27/24 0352 09/27/24 0747 09/27/24 0807 09/27/24 1101  BP: 121/80 (!) 141/77  136/77  Pulse: 87 90  82  Resp: 12 18  16   Temp: 98.4 F (36.9 C) 98.2 F (36.8 C)  97.8 F (36.6 C)  TempSrc: Oral Oral  Oral  SpO2: 99% 99% 99% 100%  Weight:  78.1 kg      General: Pt is alert, awake, not in acute distress Cardiovascular: RRR, S1/S2 +, no rubs, no gallops Respiratory: CTA bilaterally, no wheezing, no rhonchi Abdominal: Soft, NT, ND, PD catheter noted clean dry intact Extremities: no edema, no cyanosis    The results of significant diagnostics from this hospitalization (including imaging, microbiology, ancillary and  laboratory) are listed below for reference.     Microbiology: Recent Results (from the past 240 hours)  WET PREP BY MOLECULAR PROBE     Status: None   Collection Time: 09/20/24  1:15 PM   Specimen: Vaginal Swab  Result Value Ref Range Status   MICRO NUMBER: [id number]  Final   SPECIMEN QUALITY: Adequate  Final   SOURCE: VAGINA  Final   STATUS: FINAL  Final   Trichomonas vaginosis Not Detected  Final   Gardnerella vaginalis Not Detected  Final   Candida species Not Detected  Final  Blood Culture (routine x 2)     Status: None (Preliminary result)   Collection Time: 09/23/24  2:31 PM   Specimen: BLOOD  Result Value Ref Range Status   Specimen Description BLOOD CENTRAL LINE  Final   Special Requests   Final    BOTTLES DRAWN AEROBIC AND ANAEROBIC Blood Culture adequate volume   Culture   Final    NO GROWTH 4 DAYS Performed at [facility], [address], [place], [place] [address]    Report Status PENDING  Incomplete  MRSA Next Gen by PCR, Nasal     Status: None   Collection Time: 09/23/24  4:02 PM   Specimen: Nasal Mucosa; Nasal Swab  Result Value Ref Range Status   MRSA by  PCR Next Gen NOT DETECTED NOT DETECTED Final    Comment: (NOTE) The GeneXpert MRSA Assay (FDA approved for NASAL specimens only), is one component of a comprehensive MRSA colonization surveillance program. It is not intended to diagnose MRSA infection nor to guide or monitor treatment for MRSA infections. Test performance is not FDA approved in patients less than 32 years old. Performed at [facility], [address], [place], [place] [address]   Blood Culture (routine x 2)     Status: None (Preliminary result)   Collection Time: 09/23/24  5:27 PM   Specimen: BLOOD  Result Value Ref Range Status   Specimen Description BLOOD SITE NOT SPECIFIED  Final   Special Requests   Final    BOTTLES DRAWN AEROBIC ONLY Blood Culture results may not be optimal due to an inadequate volume of blood received  in culture bottles   Culture   Final    NO GROWTH 4 DAYS Performed at [facility], [address] [place], [place] [address]    Report Status PENDING  Incomplete  Body fluid culture w Gram Stain     Status: None   Collection Time: 09/24/24  7:00 AM   Specimen: Peritoneal Dialysate; Body Fluid  Result Value Ref Range Status   Specimen Description PERITONEAL DIALYSATE  Final   Special Requests Immunocompromised ABDOMINAL  Final   Gram Stain NO WBC SEEN NO ORGANISMS SEEN   Final   Culture   Final    NO GROWTH 3 DAYS Performed at [facility], [address] [place], [place] [address]    Report Status 09/27/2024 FINAL  Final     Labs: BNP (last 3 results) Recent Labs    09/24/24 0927  BNP 1,614.8*   Basic Metabolic Panel: Recent Labs  Lab 09/23/24 1431 09/24/24 0354 09/24/24 1731 09/25/24 0537 09/26/24 0532 09/26/24 1454 09/26/24 1458 09/27/24 0356  NA 141 135  --  130* 130* 131* 131* 131*  K 2.8* 2.9* 3.7 3.4* 3.8 4.2 4.2 3.9  CL 98 98  --  93* 94*  --   --  94*  CO2 25 17*  --  20* 22  --   --  20*  GLUCOSE 101* 136*  --  117* 166*  --   --  143*  BUN 39* 35*  --  33* 37*  --   --  38*  CREATININE 14.09* 11.85*  --  11.08* 10.62*  --   --  11.00*  CALCIUM  8.1* 8.3*  --  8.4* 8.6*  --   --  8.3*  MG  --  1.4* 2.7* 2.6* 2.3  --   --  2.3  PHOS  --  3.9  --  3.7 4.3  --   --  5.7*   Liver Function Tests: Recent Labs  Lab 09/23/24 1431  AST 19  ALT 9  ALKPHOS 244*  BILITOT 1.0  PROT 6.6  ALBUMIN  1.9*   Recent Labs  Lab 09/23/24 1839  LIPASE 30  AMYLASE 48   No results for input(s): AMMONIA in the last 168 hours. CBC: Recent Labs  Lab 09/23/24 1431 09/24/24 0354 09/25/24 0537 09/26/24 0532 09/26/24 1454 09/26/24 1458 09/27/24 0356  WBC 7.8 10.8* 11.7* 16.1*  --   --  12.9*  NEUTROABS 5.1  --   --   --   --   --   --   HGB 11.8* 9.2* 9.1* 9.8* 9.9* 10.2* 9.4*  HCT 36.9  28.0* 27.5* 28.8* 29.0* 30.0* 27.8*  MCV 111.1* 111.1*  108.7* 108.7*  --   --  108.2*  PLT 214 186 180 226  --   --  247   Cardiac Enzymes: No results for input(s): CKTOTAL, CKMB, CKMBINDEX, TROPONINI in the last 168 hours. BNP: Invalid input(s): POCBNP CBG: Recent Labs  Lab 09/26/24 2008 09/27/24 0004 09/27/24 0357 09/27/24 0849 09/27/24 1256  GLUCAP 120* 154* 149* 85 83   D-Dimer No results for input(s): DDIMER in the last 72 hours. Hgb A1c No results for input(s): HGBA1C in the last 72 hours. Lipid Profile No results for input(s): CHOL, HDL, LDLCALC, TRIG, CHOLHDL, LDLDIRECT in the last 72 hours. Thyroid  function studies No results for input(s): TSH, T4TOTAL, T3FREE, THYROIDAB in the last 72 hours.  Invalid input(s): FREET3 Anemia work up No results for input(s): VITAMINB12, FOLATE, FERRITIN, TIBC, IRON , RETICCTPCT in the last 72 hours. Urinalysis    Component Value Date/Time   COLORURINE YELLOW 07/18/2024 2142   APPEARANCEUR HAZY (A) 07/18/2024 2142   LABSPEC 1.025 07/18/2024 2142   PHURINE 5.0 07/18/2024 2142   GLUCOSEU NEGATIVE 07/18/2024 2142   GLUCOSEU NEG mg/dL 95/91/7990 8581   HGBUR NEGATIVE 07/18/2024 2142   BILIRUBINUR SMALL (A) 07/18/2024 2142   BILIRUBINUR neg 10/19/2022 1340   KETONESUR NEGATIVE 07/18/2024 2142   PROTEINUR 30 (A) 07/18/2024 2142   UROBILINOGEN 0.2 10/19/2022 1340   UROBILINOGEN 0.2 10/21/2014 2347   NITRITE NEGATIVE 07/18/2024 2142   LEUKOCYTESUR MODERATE (A) 07/18/2024 2142   Sepsis Labs Recent Labs  Lab 09/24/24 0354 09/25/24 0537 09/26/24 0532 09/27/24 0356  WBC 10.8* 11.7* 16.1* 12.9*   Microbiology Recent Results (from the past 240 hours)  WET PREP BY MOLECULAR PROBE     Status: None   Collection Time: 09/20/24  1:15 PM   Specimen: Vaginal Swab  Result Value Ref Range Status   MICRO NUMBER: [id number]  Final   SPECIMEN QUALITY: Adequate  Final   SOURCE: VAGINA  Final   STATUS: FINAL  Final   Trichomonas vaginosis Not  Detected  Final   Gardnerella vaginalis Not Detected  Final   Candida species Not Detected  Final  Blood Culture (routine x 2)     Status: None (Preliminary result)   Collection Time: 09/23/24  2:31 PM   Specimen: BLOOD  Result Value Ref Range Status   Specimen Description BLOOD CENTRAL LINE  Final   Special Requests   Final    BOTTLES DRAWN AEROBIC AND ANAEROBIC Blood Culture adequate volume   Culture   Final    NO GROWTH 4 DAYS Performed at [facility], [address] [place], [place] [address]    Report Status PENDING  Incomplete  MRSA Next Gen by PCR, Nasal     Status: None   Collection Time: 09/23/24  4:02 PM   Specimen: Nasal Mucosa; Nasal Swab  Result Value Ref Range Status   MRSA by PCR Next Gen NOT DETECTED NOT DETECTED Final    Comment: (NOTE) The GeneXpert MRSA Assay (FDA approved for NASAL specimens only), is one component of a comprehensive MRSA colonization surveillance program. It is not intended to diagnose MRSA infection nor to guide or monitor treatment for MRSA infections. Test performance is not FDA approved in patients less than 41 years old. Performed at [facility], [address] [place], [place] [address]   Blood Culture (routine x 2)     Status: None (Preliminary result)   Collection Time: 09/23/24  5:27 PM   Specimen: BLOOD  Result Value Ref Range Status   Specimen Description BLOOD SITE NOT SPECIFIED  Final   Special Requests   Final    BOTTLES DRAWN AEROBIC ONLY Blood Culture results may not be optimal due to an inadequate volume of blood received in culture bottles   Culture   Final    NO GROWTH 4 DAYS Performed at [facility], [address] [place], [place] [address]    Report Status PENDING  Incomplete  Body fluid culture w Gram Stain     Status: None   Collection Time: 09/24/24  7:00 AM   Specimen: Peritoneal Dialysate; Body Fluid  Result Value Ref Range Status   Specimen Description PERITONEAL DIALYSATE  Final    Special Requests Immunocompromised ABDOMINAL  Final   Gram Stain NO WBC SEEN NO ORGANISMS SEEN   Final   Culture   Final    NO GROWTH 3 DAYS Performed at [facility], [address], [place], [place] [address]    Report Status 09/27/2024 FINAL  Final     Time coordinating discharge: Over 30 minutes  SIGNED:   Elsie JAYSON Montclair, DO [facility] 09/27/2024, 3:53 PM Pager   If 7PM-7AM, please contact night-coverage [email or web address]     [1]  Allergies Allergen Reactions   Genvoya [Elviteg-Cobic-Emtricit-Tenofaf] Hives   Lisinopril Cough   Aldactone  [Spironolactone ] Hives   Valium  [Diazepam ] Other (See Comments)    Lethargy (pt states she cannot take this again)   Tegaderm Ag Mesh [Silver] Itching

## 2024-09-27 NOTE — Progress Notes (Signed)
 Inpatient Rehabilitation Admission Medication Review by a Pharmacist  A complete drug regimen review was completed for this patient to identify any potential clinically significant medication issues.  High Risk Drug Classes Is patient taking? Indication by Medication  Antipsychotic Yes, as an intravenous medication Prochlorperazine  - nausea/vomiting  Anticoagulant Yes Heparin  - DVT prophylaxis  Antibiotic No   Opioid Yes Oxycodone  - pain  Antiplatelet Yes Aspirin  - NSTEMI  Hypoglycemics/insulin  Yes Novolog  - diabetes  Vasoactive Medication Yes Metoprolol  - heart failure  Chemotherapy No   Other Yes bisacodyl   and - constipation Pantoprazole - reflux  Diphenhydramine - itching  Acetaminophen - pain  Robitussin- cough  simethicone - gas   trazodone and -insomnia Allopurinol  - elevated ric acid Budesonide  - asthma Calcitriol  - secondary hyperparathyroidism of CKD [name] , Descovy  - HIV Gentamicin , Dialysis solution - peritoneal dialysis Ensure, renavit - supplement Fludrocortisone , hydrocortisone  - adrenal insufficiency Levothyroxine  - hypothyoridism Lidocaine  patch - pain Loratadine  - allergies Mirtazapine  - sleep/mood Rosuvastatin  - HLD Buspirone  - anciety Milk and molasses enema - constipation     Type of Medication Issue Identified Description of Issue Recommendation(s)  Drug Interaction(s) (clinically significant)     Duplicate Therapy     Allergy     No Medication Administration End Date     Incorrect Dose     Additional Drug Therapy Needed     Significant med changes from prior encounter (inform family/care partners about these prior to discharge). Home meds not ordered or substituted this admission: -Auryxia  -Minocycline  -Renvela  Communicate relevant medication changes to patient/family members at discharge from CIR.   Restart or discontinue PTA meds not resumed in CIR at discharge if clinically indicated.   Other       Clinically significant medication  issues were identified that warrant physician communication and completion of prescribed/recommended actions by midnight of the next day:  No  Name of provider notified for urgent issues identified:   Provider Method of Notification:     Pharmacist comments:   Time spent performing this drug regimen review (minutes):  20  Larraine Brazier, PharmD Clinical Pharmacist 09/27/2024  5:49 PM **Pharmacist phone directory can now be found on [email or web address] ([email or web address]).  Listed under [facility].

## 2024-09-27 NOTE — Progress Notes (Signed)
 [name], MD  Physician Physical Medicine and Rehabilitation   PMR Pre-admission    Signed   Date of Service: 09/27/2024  2:15 PM  Related encounter: ED to Hosp-Admission (Current) from 09/23/2024 in [facility] 4E CV SURGICAL PROGRESSIVE CARE   Signed     Expand All Collapse All  PMR Admission Coordinator Pre-Admission Assessment   Patient: [name] is an 58 y.o., female MRN: [id number] DOB: 05-01-1966 Height:   Weight: 83.4 kg   Insurance Information HMO: yes    PPO:      PCP:      IPA:      80/20:      OTHER:  PRIMARY: [employer] Medicare      Policy#: [id number]       Subscriber: pt CM Name: [name]      Phone#: [phone]     Fax#: [phone] Pre-Cert#: [id number] auth for CIR from Felicia Solian with [facility] Medicare for admit 12/10 with discharge information to fax listed above 24 hours prior to discharge.         Employer:  Benefits:  Phone #: [phone]     Name:  Eff. Date: 10/18/23     Deduct: $0      Out of Pocket Max: [phone] (met [phone])      Life Max: n/a CIR: $399/day for days 1-7      SNF: $10/day Outpatient:      Co-Pay: $25/visit Home Health: 100%      Co-Pay: DME: 80%     Co-Pay: 20% Providers:  SECONDARY:       Policy#:      Phone#:    Artist:       Phone#:    The Best Boy for patients in Inpatient Rehabilitation Facilities with attached Privacy Act Statement-Health Care Records was provided and verbally reviewed with: Patient and Family   Emergency Contact Information Contact Information       Name Relation Home Work Mobile    Mason,Share Sister [phone]   [phone]    Arvidson,Deandra Daughter [phone]   [phone]         Other Contacts       Name Relation Home Work Mobile    Mason,Nocita Sister [phone]        MASON,JAMES Father [phone]   [phone]           Current Medical History  Patient Admitting Diagnosis: circulatory shock, sepsis, renal failure   History  of Present Illness: Pt is a 58 y/o female with PMH of ESRD (on PD), recent pituitary hemorrhage, HIV, aortic dissection s/p repair, who was admitted to [name] from her dialysis center with back and abdominal pain and dyspnea.  In ED her BP was 64/30 and she received aggressive IV fluids.  CTA abdomen did not demonstrate change in aortic aneurysm/graft or signs of fluid in abdomen.  EKG showed L BBB with PACs.  Echo showed EF 30-35% with L ventricular WMAs and grade 2 diastolic dysfunction with moderate to severe mitral regurgitation.  Cardiology consulted and performed bilateral heart cath on 12/11 which showed tortuous but normal coronary arteries. New HF likely due to stress cardiomyopathy.   Plan for GDMT as pt able to tolerate.  Nephrology following for management of Peritoneal Dialysis. Will need close medical management/monitoring of cardiac and renal status while adjusting medications.  Therapy has been ongoing and pt has been recommended for CIR due to functional decline in the setting of multiple medical issues.  Patient's medical record from [name] has been reviewed by the rehabilitation admission coordinator and physician.   Past Medical History      Past Medical History:  Diagnosis Date   Acute pain of right shoulder due to trauma 06/08/2023   Acute pancreatitis 10/23/2021   Acute renal failure superimposed on stage 4 chronic kidney disease (HCC) 10/24/2021   Allergy     Anemia      Normocytic   Antibiotic-induced yeast infection 09/28/2020   Anxiety     Asthma     Bronchitis 2005   Bursitis of left shoulder 09/06/2019   CKD (chronic kidney disease)      Dialysis on Mon-Wed- Fri   CLASS 1-EXOPHTHALMOS-THYROTOXIC 02/08/2007   Cognitive dysfunction 02/21/2020   COVID-19 long hauler 05/11/2021   COVID-19 virus infection 04/28/2022   Diabetes mellitus without complication (HCC)      type 2   Dissecting abdominal aortic aneurysm (HCC) 02/05/2023   DVT (deep venous  thrombosis) (HCC) 08/29/2023    post-op DVT left IJ, brachial, axillary, subclavian veins 08/29/23   DVT of upper extremity (deep vein thrombosis) (HCC) 09/03/2023   Dysphagia 07/23/2019   Encephalopathy acute 02/02/2023   Family history of breast cancer     Family history of lung cancer     Family history of prostate cancer     Gastroenteritis 07/10/2007   Genetic testing 07/25/2018    [employer] + RNA Insight was ordered.  Genes Analyzed (43 total): APC*, ATM*, AXIN2, BARD1, BMPR1A, BRCA1*, BRCA2*, BRIP1*, CDH1*, CDK4, CDKN2A, CHEK2*, DICER1, GALNT12, HOXB13, MEN1, MLH1*, MRE11A, MSH2*, MSH3, MSH6*, MUTYH*, NBN, NF1*, NTHL1, PALB2*, PMS2*, POLD1, POLE, PTEN*, RAD50, RAD51C*, RAD51D*, RET, SDHB, SDHD, SMAD4, SMARCA4, STK11 and TP53* (sequencing and deletion/duplication); EGFR (s   GERD 07/24/2006   GRAVE'S DISEASE 01/01/2008   History of hidradenitis suppurativa     History of kidney stones     History of thrush     HIV DISEASE 07/24/2006    dx March 05   Hyperlipidemia     HYPERTENSION 07/24/2006   Hyperthyroidism 08/2006    Grave's Disease -diffuse radiotracer uptake 08/25/06 Thyroid  scan-Cold nodule to R lower lobe of thyrorid   Ileus (HCC) 02/14/2023   Left bundle branch block (LBBB)     Malnutrition of moderate degree 02/08/2023   Menometrorrhagia      hx of   Nephrolithiasis     Panniculitis 05/12/2020   Papillary adenocarcinoma of thyroid  (HCC)      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 Completed 2 yrs Prednisone  after lung bx confirmation. No symptoms since then.   Sebaceous cyst 04/21/2020   Suppurative hidradenitis     Thyroid  cancer (HCC)     THYROID  NODULE, RIGHT 02/08/2007          Has the patient had major surgery during 100 days prior to admission? No   Family History   family history includes Breast cancer  in her cousin, cousin, cousin, maternal aunt, paternal aunt, and paternal aunt; Breast cancer (age of onset: 60) in her cousin; Breast cancer (age of onset: 5) in her paternal aunt; Breast cancer (age of onset: 8) in her maternal aunt; Cancer in her maternal uncle and mother; Diabetes in an other family member; Heart disease in her mother and another family member; Hypertension in her brother, father, mother, sister, sister, and another family member; Kidney disease in an other family member; Lung cancer in her paternal uncle; Lung cancer (age of onset: 50) in her father; Pancreatic cancer in her father; Prostate cancer in her paternal uncle and paternal uncle; Stroke in an other family member.   Current Medications [Current Medications]  [Current Medications]   Current Facility-Administered Medications:    [MAR Hold] allopurinol  (ZYLOPRIM ) tablet 100 mg, 100 mg, Oral, Daily, Claudene Toribio BROCKS, MD, 100 mg at 09/26/24 0935   [MAR Hold] aspirin  chewable tablet 81 mg, 81 mg, Oral, Daily, Harris, Whitney D, NP, 81 mg at 09/26/24 0507   aspirin  chewable tablet 81 mg, 81 mg, Oral, Pre-Cath, Arida, Deatrice LABOR, MD   [MAR Hold] budesonide  (PULMICORT ) nebulizer solution 0.25 mg, 0.25 mg, Nebulization, BID, Claudene Toribio BROCKS, MD, 0.25 mg at 09/26/24 9178   [facility] Hold] busPIRone  (BUSPAR ) tablet 10 mg, 10 mg, Oral, BID PRN, Claudene Toribio BROCKS, MD, 10 mg at 09/24/24 2144   [facility] Hold] calcitRIOL  (ROCALTROL ) capsule 0.25 mcg, 0.25 mcg, Oral, Once per day on Monday Wednesday Friday, Tanda, Tara N, PA-C, 0.25 mcg at 09/25/24 9148   [facility]  Hold] cetaphil lotion, , Topical, PRN, Autry, Lauren E, PA-C   [MAR Hold] Chlorhexidine  Gluconate Cloth 2 % PADS 6 each, 6 each, Topical, Daily, Claudene Toribio BROCKS, MD, 6 each at 09/25/24 2030   [facility] Hold] dialysis solution 1.5% low-MG/low-CA dianeal  solution, , Intraperitoneal, Q24H, Geralynn Charleston, MD   [facility] Hold] docusate sodium  (COLACE) capsule 100 mg, 100 mg, Oral, BID PRN, Claudene Toribio BROCKS, MD   [facility] Hold] dolutegravir  ([name] ) tablet 50 mg, 50 mg, Oral, Daily, Claudene Toribio BROCKS, MD, 50 mg at 09/26/24 9062   [facility] Hold] emtricitabine -tenofovir  AF (DESCOVY ) 200-25 MG per tablet 1 tablet, 1 tablet, Oral, Daily, Claudene Toribio BROCKS, MD, 1 tablet at 09/26/24 9062   [facility] Hold] feeding supplement (ENSURE PLUS HIGH PROTEIN) liquid 237 mL, 237 mL, Oral, BID BM, Claudene Toribio BROCKS, MD, 237 mL at 09/25/24 1447   fentaNYL  (SUBLIMAZE ) injection, , , PRN, Arida, Muhammad A, MD, 25 mcg at 09/26/24 1448   [MAR Hold] fludrocortisone  (FLORINEF ) tablet 0.1 mg, 0.1 mg, Oral, Daily, Autry, Lauren E, PA-C, 0.1 mg at 09/26/24 0936   [MAR Hold] gentamicin  cream (GARAMYCIN ) 0.1 % 1 Application, 1 Application, Topical, Daily, Geralynn Charleston, MD, 1 Application at 09/25/24 1955   Heparin  (Porcine) in NaCl 1000-0.9 UT/500ML-% SOLN, , , PRN, Arida, Muhammad A, MD, 1,000 mL at 09/26/24 1451   [MAR Hold] heparin  injection 5,000 Units, 5,000 Units, Subcutaneous, Q8H, Autry, Lauren E, PA-C, 5,000 Units at 09/26/24 0509   [MAR Hold] heparin  sodium (porcine) 3,000 Units in dialysis solution 1.5% low-MG/low-CA 6,000 mL dialysis solution, , Peritoneal Dialysis, PRN, Claudene Toribio BROCKS, MD   [facility] Hold] heparin  sodium (porcine) 3,000 Units in dialysis solution 2.5% low-MG/low-CA 6,000 mL dialysis solution, , Peritoneal Dialysis, PRN, Geralynn Charleston, MD   heparin  sodium (porcine) injection, , , PRN, Darron Deatrice LABOR, MD, 4,000 Units at 09/26/24 1456   [MAR Hold] hydrocortisone  sodium succinate  (SOLU-CORTEF ) 100 MG injection 50 mg,  50 mg,  Intravenous, Q12H, Harris, Whitney D, NP, 50 mg at 09/26/24 0834   [facility] Hold] HYDROmorphone  (DILAUDID ) injection 0.5 mg, 0.5 mg, Intravenous, Q3H PRN, Claudene Toribio BROCKS, MD, 0.5 mg at 09/25/24 2356   [MAR Hold] insulin  aspart (novoLOG ) injection 0-9 Units, 0-9 Units, Subcutaneous, Q4H, Claudene Toribio BROCKS, MD, 3 Units at 09/26/24 0350   [MAR Hold] levothyroxine  (SYNTHROID ) tablet 150 mcg, 150 mcg, Oral, Q0600, Claudene Toribio BROCKS, MD, 150 mcg at 09/26/24 0507   [MAR Hold] lidocaine  (LIDODERM ) 5 % 1 patch, 1 patch, Transdermal, Q24H, Claudene Toribio BROCKS, MD, 1 patch at 09/24/24 1941   lidocaine  (PF) (XYLOCAINE ) 1 % injection, , , PRN, Arida, Muhammad A, MD, 5 mL at 09/26/24 1448   [MAR Hold] loratadine  (CLARITIN ) tablet 10 mg, 10 mg, Oral, QHS, Claudene Toribio BROCKS, MD, 10 mg at 09/25/24 2211   midazolam  PF (VERSED ) injection, , , PRN, Arida, Muhammad A, MD, 1 mg at 09/26/24 1448   [MAR Hold] midodrine  (PROAMATINE ) tablet 5 mg, 5 mg, Oral, TID WC, Autry, Lauren E, PA-C, 5 mg at 09/25/24 1630   [MAR Hold] mirtazapine  (REMERON ) tablet 7.5 mg, 7.5 mg, Oral, QHS, Autry, Lauren E, PA-C, 7.5 mg at 09/25/24 2211   [MAR Hold] multivitamin (RENA-VIT) tablet 1 tablet, 1 tablet, Oral, QHS, Desai, Nikita S, MD, 1 tablet at 09/25/24 2211   [facility] Hold] ondansetron  (ZOFRAN ) injection 4 mg, 4 mg, Intravenous, Q6H PRN, Claudene Toribio BROCKS, MD   [facility] Hold] Oral care mouth rinse, 15 mL, Mouth Rinse, PRN, Lue Elsie BROCKS, MD   [facility] Hold] oxyCODONE  (Oxy IR/ROXICODONE ) immediate release tablet 5 mg, 5 mg, Oral, Q6H PRN, Harris, Whitney D, NP   [MAR Hold] pantoprazole  (PROTONIX ) EC tablet 40 mg, 40 mg, Oral, Daily, Claudene Toribio BROCKS, MD, 40 mg at 09/26/24 0936   [MAR Hold] polyethylene glycol (MIRALAX  / GLYCOLAX ) packet 17 g, 17 g, Oral, Daily PRN, Claudene Toribio BROCKS, MD   Radial Cocktail/Verapamil  only, , , PRN, Arida, Muhammad A, MD, 10 mL at 09/26/24 1451   [MAR Hold] rosuvastatin  (CRESTOR ) tablet 10 mg, 10 mg, Oral, Daily, Claudene Toribio BROCKS,  MD, 10 mg at 09/26/24 0935   Patients Current Diet:  Diet Order                  Diet NPO time specified Except for: Sips with Meds  Diet effective 0500                         Precautions / Restrictions Precautions Precautions: Fall Precaution/Restrictions Comments: PD catheter Restrictions Weight Bearing Restrictions Per Provider Order: No    Has the patient had 2 or more falls or a fall with injury in the past year? Yes   Prior Activity Level Community (5-7x/wk): 1 month prior to admit was mod I with mobility/ADLs/iADLs, recently has been requiring some assist for ADLs and not mobilizing as much   Prior Functional Level Self Care: Did the patient need help bathing, dressing, using the toilet or eating? Independent   Indoor Mobility: Did the patient need assistance with walking from room to room (with or without device)? Independent   Stairs: Did the patient need assistance with internal or external stairs (with or without device)? Independent   Functional Cognition: Did the patient need help planning regular tasks such as shopping or remembering to take medications? Independent   Patient Information Are you of Hispanic, Latino/a,or Spanish origin?: A. No, not of Hispanic, Latino/a, or  Spanish origin What is your race?: B. Black or African American Do you need or want an interpreter to communicate with a doctor or health care staff?: 0. No   Patient's Response To:  Health Literacy and Transportation Is the patient able to respond to health literacy and transportation needs?: Yes Health Literacy - How often do you need to have someone help you when you read instructions, pamphlets, or other written material from your doctor or pharmacy?: Never In the past 12 months, has lack of transportation kept you from medical appointments or from getting medications?: No In the past 12 months, has lack of transportation kept you from meetings, work, or from getting things needed  for daily living?: No   Home Assistive Devices / Equipment Home Equipment: Agricultural Consultant (2 wheels)   Prior Device Use: Indicate devices/aids used by the patient prior to current illness, exacerbation or injury? Walker   Current Functional Level Cognition   Orientation Level: Oriented X4    Extremity Assessment (includes Sensation/Coordination)   Upper Extremity Assessment: Generalized weakness  Lower Extremity Assessment: Defer to PT evaluation     ADLs   Overall ADL's : Needs assistance/impaired Eating/Feeding: NPO Grooming: Contact guard assist, Standing Upper Body Bathing: Set up, Sitting Lower Body Bathing: Minimal assistance, Sit to/from stand Upper Body Dressing : Set up, Sitting Lower Body Dressing: Minimal assistance, Sit to/from stand Toilet Transfer: Contact guard assist, Rolling walker (2 wheels), Ambulation Functional mobility during ADLs: Contact guard assist, Rolling walker (2 wheels)     Mobility   Overal bed mobility: Needs Assistance Bed Mobility: Supine to Sit, Sit to Supine Supine to sit: Supervision Sit to supine: Supervision General bed mobility comments: assist for lines     Transfers   Overall transfer level: Needs assistance Equipment used: Rolling walker (2 wheels) Transfers: Sit to/from Stand Sit to Stand: Min assist, +2 physical assistance General transfer comment: Min A for balance stabilization. 2 person assist for safety with lines/leads.     Ambulation / Gait / Stairs / Wheelchair Mobility   Ambulation/Gait Ambulation/Gait assistance: Min assist, +2 safety/equipment Gait Distance (Feet): 35 Feet (&12', 20') Assistive device: Rolling walker (2 wheels) Gait Pattern/deviations: Step-through pattern, Decreased stride length, Wide base of support, Shuffle General Gait Details: tremors noted and pt reported feeling shaky, seated rest x 2, though VSS except one episode desaturation, improved with pursed lip breathing on RA Gait velocity:  decreased Gait velocity interpretation: <1.31 ft/sec, indicative of household ambulator     Posture / Balance Balance Overall balance assessment: Needs assistance Sitting-balance support: Bilateral upper extremity supported, Feet supported Sitting balance-Leahy Scale: Good Standing balance support: Bilateral upper extremity supported Standing balance-Leahy Scale: Poor Standing balance comment: UE support on walker     Special considerations/life events  Dialysis: Peritoneal, Oxygen  intermittently via [place] in hospital setting, not at home, and Diabetic management yes    Previous Home Environment (from acute therapy documentation) Living Arrangements: Alone Available Help at Discharge: Family, Available 24 hours/day Type of Home: Other(Comment) (townhome) Home Layout: Two level, 1/2 bath on main level, Bed/bath upstairs, Able to live on main level with bedroom/bathroom Alternate Level Stairs-Rails: Right, Left, Can reach both Alternate Level Stairs-Number of Steps: 10 Home Access: Stairs to enter Entrance Stairs-Rails: None Entrance Stairs-Number of Steps: 2 Bathroom Shower/Tub: Associate Professor: Yes Home Care Services: No   Discharge Living Setting Plans for Discharge Living Setting: Patient's home Type of Home at Discharge: House Discharge Home Layout: Two level,  Bed/bath upstairs, 1/2 bath on main level Alternate Level Stairs-Rails: Right, Left Alternate Level Stairs-Number of Steps: 10 Discharge Home Access: Stairs to enter Entrance Stairs-Rails: None Entrance Stairs-Number of Steps: 2 Discharge Bathroom Shower/Tub: Tub/shower unit Discharge Bathroom Toilet: Standard Discharge Bathroom Accessibility: Yes How Accessible: Accessible via walker Does the patient have any problems obtaining your medications?: No   Social/Family/Support Systems Anticipated Caregiver: sister, Share coordintaing Anticipated Caregiver's Contact  Information: [phone] Ability/Limitations of Caregiver: intermittent assist available, family have been rotating to assisting with PD needs, meals, driving, etc. Caregiver Availability: Intermittent Discharge Plan Discussed with Primary Caregiver: Yes Is Caregiver In Agreement with Plan?: Yes Does Caregiver/Family have Issues with Lodging/Transportation while Pt is in Rehab?: No   Goals Patient/Family Goal for Rehab: PT/OT mod I, SLP n/a Expected length of stay: 7-10 days Additional Information: Discharge plan: home with intermittent support from family as previously; townhome can be set up for main floor access if needed Pt/Family Agrees to Admission and willing to participate: Yes Program Orientation Provided & Reviewed with Pt/Caregiver Including Roles  & Responsibilities: Yes   Decrease burden of Care through IP rehab admission: n/a   Possible need for SNF placement upon discharge: No.  Plan to discharge home to pt's previous living environment with family providing intermittent assist.    Patient Condition: This patient's condition remains as documented in the consult dated 12/11, in which the Rehabilitation Physician determined and documented that the patient's condition is appropriate for intensive rehabilitative care in an inpatient rehabilitation facility. Will admit to inpatient rehab today.   Preadmission Screen Completed By:  Reche FORBES Lowers, 09/26/2024 3:02 PM ______________________________________________________________________   Discussed status with Dr. Babs on 09/27/2024  at 3:35 PM  and received approval for admission today.   Admission Coordinator:  Twylia Oka E Laurence Crofford, PT, time 3:35 PM Pattricia 09/27/2024     Assessment/Plan: Diagnosis: debility after circulatory shock, multiple medical Does the need for close, 24 hr/day Medical supervision in concert with the patient's rehab needs make it unreasonable for this patient to be served in a less intensive setting?  Yes Co-Morbidities requiring supervision/potential complications: ESRD on PD, peripheral neuropathy, HF, HIV Due to bladder management, bowel management, safety, skin/wound care, disease management, medication administration, pain management, and patient education, does the patient require 24 hr/day rehab nursing? Yes Does the patient require coordinated care of a physician, rehab nurse, PT, OT,  to address physical and functional deficits in the context of the above medical diagnosis(es)? Yes Addressing deficits in the following areas: balance, endurance, locomotion, strength, transferring, bowel/bladder control, bathing, dressing, feeding, toileting, and psychosocial support Can the patient actively participate in an intensive therapy program of at least 3 hrs of therapy 5 days a week? Yes The potential for patient to make measurable gains while on inpatient rehab is excellent Anticipated functional outcomes upon discharge from inpatient rehab: modified independent PT, modified independent OT, n/a SLP Estimated rehab length of stay to reach the above functional goals is: 7-10 days Anticipated discharge destination: Home 10. Overall Rehab/Functional Prognosis: excellent     MD Signature: Arthea IVAR Babs, MD, [facility] Physical Medicine & Rehabilitation Medical Director Rehabilitation Services 09/27/2024            Revision History  Date/Time User Provider Type Action  09/27/2024  3:54 PM [name], MD Physician Sign  09/27/2024  3:35 PM Lowers Reche FORBES, PT Rehab Admission Coordinator Share  09/27/2024  2:15 PM Lowers Reche FORBES, PT Rehab Admission Coordinator Share  09/26/2024  3:09 PM Jiselle Sheu E, PT Rehab Admission Coordinator Share   View Details Report

## 2024-09-27 NOTE — Care Management Important Message (Signed)
 Important Message  Patient Details  Name: [name] MRN: [id number] Date of Birth: 27-Sep-1966   Important Message Given:  Yes - Medicare IM     [name] 09/27/2024, 4:12 PM

## 2024-09-27 NOTE — Progress Notes (Signed)
 [facility] Progress Note   Subjective:   Had PD overnight with minimal UF. Cx from 09/24/24 with NGTD. Denies SOB, CP, dizziness, nausea.   Objective Vitals:   09/27/24 0000 09/27/24 0352 09/27/24 0747 09/27/24 0807  BP: 112/65 121/80 (!) 141/77   Pulse: 88 87 90   Resp: 13 12 18    Temp: 98.3 F (36.8 C) 98.4 F (36.9 C) 98.2 F (36.8 C)   TempSrc: Oral Oral Oral   SpO2: 98% 99% 99% 99%  Weight:   78.1 kg    Physical Exam General: alert female in NAD Heart: RRR, no murmurs Lungs: CTA bilaterally, respirations unlabored Abdomen: abdomen soft, non-distended, non-tender Extremities: No edema b/l lower extremties Dialysis Access: PD cath in lower abdomen  Additional Objective Labs: Basic Metabolic Panel: Recent Labs  Lab 09/25/24 0537 09/26/24 0532 09/26/24 1454 09/26/24 1458 09/27/24 0356  NA 130* 130* 131* 131* 131*  K 3.4* 3.8 4.2 4.2 3.9  CL 93* 94*  --   --  94*  CO2 20* 22  --   --  20*  GLUCOSE 117* 166*  --   --  143*  BUN 33* 37*  --   --  38*  CREATININE 11.08* 10.62*  --   --  11.00*  CALCIUM  8.4* 8.6*  --   --  8.3*  PHOS 3.7 4.3  --   --  5.7*   Liver Function Tests: Recent Labs  Lab 09/23/24 1431  AST 19  ALT 9  ALKPHOS 244*  BILITOT 1.0  PROT 6.6  ALBUMIN  1.9*   Recent Labs  Lab 09/23/24 1839  LIPASE 30  AMYLASE 48   CBC: Recent Labs  Lab 09/23/24 1431 09/24/24 0354 09/25/24 0537 09/26/24 0532 09/26/24 1454 09/26/24 1458 09/27/24 0356  WBC 7.8 10.8* 11.7* 16.1*  --   --  12.9*  NEUTROABS 5.1  --   --   --   --   --   --   HGB 11.8* 9.2* 9.1* 9.8* 9.9* 10.2* 9.4*  HCT 36.9 28.0* 27.5* 28.8* 29.0* 30.0* 27.8*  MCV 111.1* 111.1* 108.7* 108.7*  --   --  108.2*  PLT 214 186 180 226  --   --  247   Blood Culture    Component Value Date/Time   SDES PERITONEAL DIALYSATE 09/24/2024 0700   SPECREQUEST Immunocompromised ABDOMINAL 09/24/2024 0700   CULT  09/24/2024 0700    NO GROWTH 2 DAYS Performed at [facility], [address] [place], [place] [address]    REPTSTATUS PENDING 09/24/2024 0700    Cardiac Enzymes: No results for input(s): CKTOTAL, CKMB, CKMBINDEX, TROPONINI in the last 168 hours. CBG: Recent Labs  Lab 09/26/24 1139 09/26/24 1647 09/26/24 2008 09/27/24 0004 09/27/24 0357  GLUCAP 86 84 120* 154* 149*   Iron  Studies: No results for input(s): IRON , TIBC, TRANSFERRIN, FERRITIN in the last 72 hours. @lablastinr3 @ Studies/Results: CARDIAC CATHETERIZATION Result Date: 09/26/2024 1.  Tortuous but normal coronary arteries. 2.  Left ventricular angiography was not performed.  EF was severely reduced by echo. 3.  Right heart catheterization showed normal right atrial pressure, mild pulmonary hypertension, mildly elevated wedge pressure and normal cardiac output RA: 5 mmHg RV: 37/8 mmHg PW: 18 mmHg PA: 37/18 with a mean of 23 mmHg Cardiac output/index: 5.59/2.94. Recommendations: Medical therapy for nonischemic cardiomyopathy.   Medications:  sodium chloride      dialysis solution 1.5% low-MG/low-CA      allopurinol   100 mg Oral Daily  aspirin   81 mg Oral Daily   budesonide  (PULMICORT ) nebulizer solution  0.25 mg Nebulization BID   calcitRIOL   0.25 mcg Oral Once per day on Monday Wednesday Friday   Chlorhexidine  Gluconate Cloth  6 each Topical Daily   dolutegravir   50 mg Oral Daily   emtricitabine -tenofovir  AF  1 tablet Oral Daily   feeding supplement  237 mL Oral BID BM   fludrocortisone   0.1 mg Oral Daily   gentamicin  cream  1 Application Topical Daily   heparin  injection (subcutaneous)  5,000 Units Subcutaneous Q8H   hydrocortisone  sod succinate (SOLU-CORTEF ) inj  50 mg Intravenous Q12H   insulin  aspart  0-9 Units Subcutaneous Q4H   levothyroxine   150 mcg Oral Q0600   lidocaine   1 patch Transdermal Q24H   loratadine   10 mg Oral QHS   midodrine   5 mg Oral TID WC   mirtazapine   7.5 mg Oral QHS   multivitamin  1 tablet Oral QHS   pantoprazole   40 mg  Oral Daily   rosuvastatin   10 mg Oral Daily   sodium chloride  flush  3 mL Intravenous Q12H    OP Dialysis Orders:  [facility] 7x week-[facility] 5 exchanges, fill vol 2250, 1.5hr dwell time, edw 74.5kg  Usually does 1/2 1.5% bags and 1/2 2.5% bags    CXR 12/08: no active disease   Assessment/Plan: Hypotension: 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 BP's better.  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 Cardiology consulting, w/ sig WMA's they are planning RHC/ LHC. Appreciate assitance.  Euvolemic on exam today, managing volume with dialysis.  ESRD: on CCPD. Cont PD nightly while here.  Volume: no signs of vol overload, admit CXR was negative. Use all 1.5% w/ PD tonight.  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 Follow, transfuse prn.  Tremors: discontinued gabapentin  and this is a cause of tremors/ asterixis in ESRD pts.     Lucie Collet, PA-C 09/27/2024, 8:25 AM  [facility] Pager: [phone]

## 2024-09-27 NOTE — H&P (Signed)
 Physical Medicine and Rehabilitation Admission H&P    Chief Complaint  Patient presents with   Functional deficits due to debility    : HPI: [name] is a 58 year old female with PMHx Graves disease, HTN, T2DM, ESRD on PD, HIV, aortic dissection s/p repair 08/2023, recent hx of pituitary apoplexy, papillary thyroid  carcinoma s/p thyroidectomy, sarcoidosis, OSA, and breast cancer previously on chemotherapy presented to the ER ON 09/23/24 from dialysis with complaints of back pain, abdominal pain and dyspnea. EMS noted BP 90/60 with decreased radial pulses, BP in ER 64/30. She received IVF resuscitation via I/O improving blood pressures. A CTA chest/abd/pelv revealed stable thoracoabdominal aortic aneurysm and dissection with no significant change with no evidence of aortic leak or rupture, persistent pneumoperitoneum and pelvic free fluid in the setting of peritoneal dialysis catheter. The patient was recently admitted with similar presentation from 11/22-11/27 where workup was negative for PD catheter associated peritonitis.   EKG demonstrated left bundle branch lock with PACs. Labs: high-sensitivity troponins 338, 392, 331, 341, sodium of 130, K+ 2.8, BUN of 39, cr 14.09, mag 2.6, WBC 7K, and hemoglobin 11.8. CXR showed no active disease.  MRI of the lumbar and thoracic spine was unrevealing for any acute findings. Central line was placed to continue IV hydration. Nephrology consulted for ESRD/PD with recommendations for empiric hydrocortisone  and fludrocortisone  as adrenal insufficiency ws suspected. She was continued on PD with electrolyte replacement. CCM admitted for septic shock. An echocardiogram was completed showing decreased LVEF 30-35% with moderate to severe MR. Cardiology consulted and the patient underwent left and right heart catheterizations with normal findings. Vascular Surgery consulted for hypotension in the setting of aortic repair, indicating no further interventions  at this time.   Per chart review the patient lives in a two-level home with the ability to live on the first level. She was independent prior to arrival with use of rolling walker more recently and has become more sedentary overall. She currently requires min assist to stand, RW for support and min A to steady. Patient continues to be limited by decreased activity, impaired balance/postural reactions and global weakness. Therapy evaluations completed due to patient decreased functional mobility was admitted for a comprehensive rehab program.       Review of Systems  Constitutional:  Positive for malaise/fatigue. Negative for fever.  HENT:  Negative for ear discharge and hearing loss.   Eyes: Negative.   Respiratory:  Positive for shortness of breath.   Cardiovascular:  Negative for chest pain and palpitations.  Gastrointestinal:  Positive for abdominal pain and nausea.  Genitourinary:  Positive for flank pain.  Musculoskeletal:  Positive for back pain and myalgias.  Skin:  Negative for rash.  Neurological:  Positive for tingling, sensory change and weakness.  Psychiatric/Behavioral:  Negative for depression.         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  Genes Analyzed (43 total): APC*, ATM*, AXIN2, BARD1, BMPR1A, BRCA1*, BRCA2*, BRIP1*, CDH1*, CDK4, CDKN2A, CHEK2*, DICER1, GALNT12, HOXB13, MEN1, MLH1*, MRE11A, MSH2*, MSH3, MSH6*, MUTYH*, NBN, NF1*, NTHL1, PALB2*, PMS2*, POLD1, POLE, PTEN*, RAD50, RAD51C*, RAD51D*, RET, SDHB, SDHD, SMAD4, SMARCA4, STK11 and TP53* (sequencing and deletion/duplication); EGFR (s   GERD 07/24/2006   GRAVE'S DISEASE 01/01/2008   History of hidradenitis suppurativa    History of kidney stones    History of thrush    HIV DISEASE 07/24/2006   dx March 05   Hyperlipidemia    HYPERTENSION 07/24/2006   Hyperthyroidism 08/2006   Grave's Disease -diffuse radiotracer uptake 08/25/06 Thyroid  scan-Cold nodule to R lower lobe of thyrorid   Ileus (HCC) 02/14/2023   Left bundle branch block (LBBB)    Malnutrition of moderate degree 02/08/2023   Menometrorrhagia    hx of   Nephrolithiasis    Panniculitis 05/12/2020   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 Completed 2 yrs  Prednisone  after lung bx confirmation. No symptoms since then.   Sebaceous cyst 04/21/2020   Suppurative hidradenitis    Thyroid  cancer (HCC)    THYROID  NODULE, RIGHT 02/08/2007   Past Surgical History:  Procedure Laterality Date   AORTIC ARCH DEBRANCHING N/A 08/25/2023   Procedure: AORTIC ARCH DEBRANCHING USING 12X8X8MM HEMASHIELD GRAFT;  Surgeon: Lucas Dorise POUR, MD;  [facility];  Service: Open Heart Surgery;  Laterality: N/A;  with bypasses to the innominate and left common carotid arteries   APPLICATION OF WOUND VAC N/A 01/20/2021   Procedure: APPLICATION OF WOUND VAC;  Surgeon: Lowery Estefana RAMAN, DO;  Location: [facility];  Service: Plastics;  Laterality: N/A;   BREAST EXCISIONAL BIOPSY Right 04/26/2018   right axilla negative   BREAST EXCISIONAL BIOPSY Left 04/26/2018   left axilla negative   BREAST LUMPECTOMY Right 10/03/2018   malignant   BREAST LUMPECTOMY WITH RADIOACTIVE SEED AND SENTINEL LYMPH NODE BIOPSY Right 10/03/2018   Procedure: RIGHT BREAST LUMPECTOMY WITH RADIOACTIVE SEED AND SENTINEL LYMPH NODE MAPPING;  Surgeon: Vanderbilt Ned, MD;  Location: [facility];  Service: General;  Laterality: Right;   BREAST SURGERY  1997   Breast Reduction    CAPD INSERTION N/A 12/21/2023   Procedure: LAPAROSCOPIC INSERTION CONTINUOUS AMBULATORY PERITONEAL DIALYSIS  (CAPD) CATHETER; LAPAROSCOPIC OMENTUMPEXY;  Surgeon: Magda Ned SAILOR, MD;  [facility];  Service: Vascular;  Laterality: N/A;   CYSTOSCOPY W/ URETERAL STENT REMOVAL  11/09/2012   Procedure: CYSTOSCOPY WITH STENT REMOVAL;  Surgeon: Ricardo Likens, MD;  Location: [facility];  Service: Urology;  Laterality: Right;   CYSTOSCOPY WITH RETROGRADE PYELOGRAM, URETEROSCOPY AND STENT PLACEMENT  11/09/2012   Procedure: CYSTOSCOPY WITH RETROGRADE PYELOGRAM, URETEROSCOPY AND STENT PLACEMENT;  Surgeon: Ricardo Likens, MD;  Location: [facility];  Service: Urology;  Laterality: Left;  LEFT URETEROSCOPY, STONE MANIPULATION, left STENT  exchange    CYSTOSCOPY WITH STENT PLACEMENT  10/02/2012   Procedure: CYSTOSCOPY WITH STENT PLACEMENT;  Surgeon: Ricardo Likens, MD;  [facility];  Service: Urology;  Laterality: Left;   DEBRIDEMENT AND CLOSURE WOUND N/A 01/20/2021   Procedure: Excision of abdominal wound with closure;  Surgeon: Lowery Estefana RAMAN, DO;  Location: [facility];  Service: Plastics;  Laterality: N/A;   DILATION AND CURETTAGE OF UTERUS  11/2002   s/p for 1st trimester nonviable pregnancy   ESOPHAGOGASTRODUODENOSCOPY N/A 09/11/2024   Procedure: EGD (ESOPHAGOGASTRODUODENOSCOPY);  Surgeon: Abran Norleen SAILOR, MD;  Location: [facility];  Service: Gastroenterology;  Laterality: N/A;   EYE SURGERY     sty under eyelid   INCISE AND DRAIN ABCESS  08/2002   s/p I &D for righ inframmary fold hidradenitis   INCISION AND DRAINAGE PERITONSILLAR ABSCESS  12/2001   IR CV LINE INJECTION  06/07/2018   IR FLUORO GUIDE CV LINE RIGHT  01/30/2023   IR IMAGING GUIDED PORT INSERTION  06/20/2018   IR REMOVAL TUN ACCESS W/ PORT W/O FL MOD SED  06/20/2018   IR US  GUIDE VASC ACCESS RIGHT  01/30/2023   IRRIGATION AND DEBRIDEMENT ABSCESS  01/31/2012   Procedure: IRRIGATION AND DEBRIDEMENT ABSCESS;  Surgeon: Alm VEAR Angle, MD;  [facility];  Service: General;  Laterality: Right;  right breast and axilla    LAPAROSCOPIC REPOSITIONING CAPD CATHETER Left 01/10/2024   Procedure: Removal of peritoneal dialysis Catheter.;  Surgeon: Magda Debby SAILOR, MD;  Location: [facility] OR;  Service: Vascular;  Laterality: Left;   LAPAROSCOPY N/A 01/10/2024   Procedure: LAPAROSCOPY, DIAGNOSTIC;  Surgeon: Magda Debby SAILOR, MD;  Location: [facility] OR;  Service: Vascular;  Laterality: N/A;   NEPHROLITHOTOMY  10/02/2012   Procedure: NEPHROLITHOTOMY PERCUTANEOUS;  Surgeon: Ricardo Likens, MD;  [facility];  Service: Urology;  Laterality: Right;  First Stage Percutaneous Nephrolithotomy with Surgeon Access, Left Ureteral Stent     NEPHROLITHOTOMY   10/04/2012   Procedure: NEPHROLITHOTOMY PERCUTANEOUS SECOND LOOK;  Surgeon: Ricardo Likens, MD;  [facility];  Service: Urology;  Laterality: Right;      NEPHROLITHOTOMY  10/08/2012   Procedure: NEPHROLITHOTOMY PERCUTANEOUS;  Surgeon: Ricardo Likens, MD;  [facility];  Service: Urology;  Laterality: Right;  THIRD STAGE, nephrostomy tube exchange x 2   NEPHROLITHOTOMY  10/11/2012   Procedure: NEPHROLITHOTOMY PERCUTANEOUS SECOND LOOK;  Surgeon: Ricardo Likens, MD;  [facility];  Service: Urology;  Laterality: Right;  RIGHT 4 STAGE PERCUTANOUS NEPHROLITHOTOMY, right URETEROSCOPY WITH HOLMIUM LASER    PANNICULECTOMY N/A 12/21/2020   Procedure: PANNICULECTOMY;  Surgeon: Lowery Estefana RAMAN, DO;  [facility];  Service: Plastics;  Laterality: N/A;   PERCUTANEOUS NEPHROSTOLITHOTOMY  04/2022   PORT-A-CATH REMOVAL N/A 07/16/2020   Procedure: REMOVAL PORT-A-CATH;  Surgeon: Vanderbilt Debby, MD;  Location: [facility];  Service: General;  Laterality: N/A;   PORTACATH PLACEMENT Left 05/17/2018   Procedure: INSERTION PORT-A-CATH;  Surgeon: Vernetta Berg, MD;  Location:  SURGERY  CENTER;  Service: General;  Laterality: Left;   RADICAL NECK DISSECTION  04/12/2017   limited/notes 04/12/2017   RADICAL NECK DISSECTION N/A 04/12/2017   Procedure: RADICAL NECK DISSECTION;  Surgeon: Carlie Clark, MD;  Location[facility];  Service: ENT;  Laterality: N/A;  limited neck dissection 2 hours total   REDUCTION MAMMAPLASTY Bilateral 1998   RIGHT/LEFT HEART CATH AND CORONARY ANGIOGRAPHY N/A 03/12/2020   Procedure: RIGHT/LEFT HEART CATH AND CORONARY ANGIOGRAPHY;  Surgeon: Cherrie Toribio SAUNDERS, MD;  [facility];  Service: Cardiovascular;  Laterality: N/A;   RIGHT/LEFT HEART CATH AND CORONARY ANGIOGRAPHY N/A 09/26/2024   Procedure: RIGHT/LEFT HEART CATH AND CORONARY ANGIOGRAPHY;  Surgeon: Darron Deatrice LABOR, MD;  [facility];  Service: Cardiovascular;   Laterality: N/A;   [place]  1994   TEE WITHOUT CARDIOVERSION N/A 08/25/2023   Procedure: TRANSESOPHAGEAL ECHOCARDIOGRAM;  Surgeon: Lucas Dorise POUR, MD;  [facility];  Service: Open Heart Surgery;  Laterality: N/A;   TENCKHOFF CATHETER INSERTION Left 01/10/2024   Procedure: INSERTION, CATHETER, DIALYSIS, PERITONEAL;  Surgeon: Magda Debby SAILOR, MD;  [facility];  Service: Vascular;  Laterality: Left;   THORACIC AORTIC ENDOVASCULAR STENT GRAFT N/A 02/02/2023   Procedure: THORACIC AORTIC ENDOVASCULAR STENT GRAFT;  Surgeon: Lanis Fonda BRAVO, MD;  Location: [facility] OR;  Service: Vascular;  Laterality: N/A;   THORACIC AORTIC ENDOVASCULAR STENT GRAFT N/A 08/25/2023   Procedure: THORACIC AORTIC ENDOVASCULAR STENT GRAFT;  Surgeon: Lanis Fonda BRAVO, MD;  Location: [facility] OR;  Service: Vascular;  Laterality: N/A;   THYROIDECTOMY  04/12/2017   completion/notes 04/12/2017   THYROIDECTOMY N/A 04/12/2017   Procedure: THYROIDECTOMY;  Surgeon: Carlie Clark, MD;  Location: [facility] OR;  Service: ENT;  Laterality: N/A;  Completion Thyroidectomy   TOTAL THYROIDECTOMY  2010   ULTRASOUND GUIDANCE FOR VASCULAR ACCESS Right 08/25/2023   Procedure: ULTRASOUND GUIDANCE FOR VASCULAR ACCESS, RIGHT FEMORAL ARTERY;  Surgeon: Lanis Fonda BRAVO, MD;  Location: [facility] OR;  Service: Vascular;  Laterality: Right;   Family History  Problem Relation Age of Onset   Hypertension Mother    Cancer Mother        laryngeal   Heart disease Mother        stent   Pancreatic cancer Father    Hypertension Father    Lung cancer Father 57       hx smoking   Hypertension Sister    Hypertension Sister    Hypertension Brother    Breast cancer Maternal Aunt 77   Breast cancer Maternal Aunt        dx 60+   Cancer Maternal Uncle        Lung CA   Breast cancer Paternal Aunt 65   Breast cancer Paternal Aunt        dx 59's   Breast cancer Paternal Aunt        dx 50's   Prostate cancer Paternal Uncle    Prostate cancer Paternal Uncle    Lung cancer  Paternal Uncle    Breast cancer Cousin 17   Breast cancer Cousin        dx <50   Breast cancer Cousin        dx <50   Breast cancer Cousin        dx <50   Heart disease Other    Hypertension Other    Stroke Other        Grandparent   Kidney disease Other  Grandparent   Diabetes Other        FH of Diabetes   Colon cancer Neg Hx    Esophageal cancer Neg Hx    Rectal cancer Neg Hx    Stomach cancer Neg Hx    Social History:  reports that she quit smoking about 6 years ago. Her smoking use included cigarettes. She started smoking about 7 years ago. She has a 7.5 pack-year smoking history. She has never used smokeless tobacco. She reports that she does not currently use alcohol . She reports that she does not use drugs. Allergies: Allergies[1] Medications Prior to Admission  Medication Sig Dispense Refill   acetaminophen  (TYLENOL ) 500 MG tablet Take 1,000 mg by mouth every 6 (six) hours as needed for mild pain.     allopurinol  (ZYLOPRIM ) 100 MG tablet TAKE 1 TABLET (100 MG TOTAL) BY MOUTH DAILY. FOR GOUT PREVENTION 90 tablet 2   aspirin  81 MG chewable tablet Chew 81 mg by mouth daily.     AURYXIA  1 GM 210 MG(Fe) tablet Take 210 mg by mouth 3 (three) times daily with meals.     busPIRone  (BUSPAR ) 5 MG tablet TAKE 1 TABLET BY MOUTH TWICE A DAY FOR ANXIETY 180 tablet 0   dolutegravir  ([name] ) 50 MG tablet Take 1 tablet (50 mg total) by mouth daily. 30 tablet 11   emtricitabine -tenofovir  AF (DESCOVY ) 200-25 MG tablet Take 1 tablet by mouth daily. 30 tablet 11   fluticasone  (FLOVENT  HFA) 110 MCG/ACT inhaler Inhale 1 puff into the lungs 2 (two) times daily.     levocetirizine (XYZAL ) 5 MG tablet TAKE 1 TABLET BY MOUTH EVERY DAY IN THE EVENING FOR ALLERGIES 90 tablet 1   levothyroxine  (SYNTHROID ) 150 MCG tablet TAKE 1 TABLET BY MOUTH EVERY DAY BEFORE BREAKFAST 30 tablet 0   lidocaine  (LIDODERM ) 5 % PLACE 1 PATCH ONTO THE SKIN DAILY. REMOVE & DISCARD PATCH WITHIN 12 HOURS OR AS DIRECTED  BY MD (Patient taking differently: Place 1 patch onto the skin daily as needed. Remove & Discard patch within 12 hours or as directed by MD) 30 patch 0   minocycline  (MINOCIN ) 100 MG capsule Take 1 capsule (100 mg total) by mouth 2 (two) times daily. 60 capsule 5   omeprazole  (PRILOSEC) 40 MG capsule Take 1 capsule (40 mg total) by mouth 2 (two) times daily. for heartburn. 90 capsule 2   ondansetron  (ZOFRAN -ODT) 4 MG disintegrating tablet Take 1 tablet (4 mg total) by mouth every 8 (eight) hours as needed for nausea or vomiting. 20 tablet 0   rosuvastatin  (CRESTOR ) 10 MG tablet TAKE 1 TABLET BY MOUTH EVERY DAY FOR CHOLESTEROL 90 tablet 2   sevelamer  carbonate (RENVELA ) 800 MG tablet Take 800 mg by mouth 3 (three) times daily with meals.     tiZANidine  (ZANAFLEX ) 4 MG tablet Take 1-2 tablets (4-8 mg total) by mouth every 8 (eight) hours as needed for muscle spasms. 90 tablet 0   calcitRIOL  (ROCALTROL ) 0.25 MCG capsule Take 0.25 mcg by mouth 3 (three) times a week. (Patient not taking: Reported on 09/23/2024)     Lancets (ONETOUCH ULTRASOFT) lancets Use as instructed to test blood sugar daily 100 each 5      Home: Home Living Family/patient expects to be discharged to:: Private residence Living Arrangements: Alone Available Help at Discharge: Family, Available 24 hours/day Type of Home: Other(Comment) (townhome) Home Access: Stairs to enter [employer] of Steps: 2 Entrance Stairs-Rails: None Home Layout: Two level, 1/2 bath on main level, Bed/bath  upstairs, Able to live on main level with bedroom/bathroom Alternate Level Stairs-Number of Steps: 10 Alternate Level Stairs-Rails: Right, Left, Can reach both Bathroom Shower/Tub: Tub/shower unit Bathroom Toilet: Standard Bathroom Accessibility: Yes Home Equipment: [employer] (2 wheels)   Functional History: Prior Function Prior Level of Function : Independent/Modified Independent Mobility Comments: recently using RW, spending  more and more time upstairs in her room. ADLs Comments: Mod I with ADL's prior to one month ago; for the past month, sister assisting intermittently, predominantly with bathing and bringing meals  Functional Status:  Mobility: Bed Mobility Overal bed mobility: Needs Assistance Bed Mobility: Supine to Sit, Sit to Supine Supine to sit: Supervision Sit to supine: Min assist General bed mobility comments: pt long sitting in bed on arrival, able to transition to EOB without assist, min A to manage LEs back to bed Transfers Overall transfer level: Needs assistance Equipment used: Rolling walker (2 wheels) Transfers: Sit to/from Stand Sit to Stand: Min assist General transfer comment: light min A to steady on rise to stand from EOB at lowest height, x2 during session Ambulation/Gait Ambulation/Gait assistance: Min assist Gait Distance (Feet): 12 Feet (+ 36') Assistive device: Rolling walker (2 wheels) Gait Pattern/deviations: Step-through pattern, Decreased stride length, Wide base of support, Shuffle General Gait Details: tremors noted and pt reported feeling shaky with knees bouncing but not buckling. HR up to 106bpm Gait velocity: decreased Gait velocity interpretation: <1.31 ft/sec, indicative of household ambulator    ADL: ADL Overall ADL's : Needs assistance/impaired Eating/Feeding: NPO Grooming: Contact guard assist, Standing Upper Body Bathing: Set up, Sitting Lower Body Bathing: Minimal assistance, Sit to/from stand Upper Body Dressing : Set up, Sitting Lower Body Dressing: Minimal assistance, Sit to/from stand Toilet Transfer: Contact guard assist, Rolling walker (2 wheels), Ambulation Functional mobility during ADLs: Contact guard assist, Rolling walker (2 wheels)  Cognition: Cognition Orientation Level: Oriented X4 Cognition Arousal: Alert Behavior During Therapy: Flat affect  Physical Exam: Blood pressure 136/77, pulse 82, temperature 97.8 F (36.6 C),  temperature source Oral, resp. rate 16, weight 78.1 kg, last menstrual period 03/31/2014, SpO2 100%. Physical Exam Constitutional:      General: She is not in acute distress.    Appearance: She is obese. She is ill-appearing.  HENT:     Head: Normocephalic and atraumatic.     Right Ear: External ear normal.     Left Ear: External ear normal.     Nose: Nose normal.     Mouth/Throat:     Pharynx: Oropharynx is clear.  Eyes:     Conjunctiva/sclera: Conjunctivae normal.     Pupils: Pupils are equal, round, and reactive to light.  Cardiovascular:     Rate and Rhythm: Normal rate and regular rhythm.     Heart sounds: No murmur heard.    No gallop.  Pulmonary:     Effort: Pulmonary effort is normal. No respiratory distress.     Breath sounds: No wheezing.  Abdominal:     General: There is no distension.     Palpations: Abdomen is soft.     Tenderness: There is abdominal tenderness.  Musculoskeletal:        General: Tenderness (low back, right flank) present. Normal range of motion.     Cervical back: Normal range of motion.  Skin:    General: Skin is dry.     Comments: PD site clean, dressed. A few scattered bruises, dry skin  Neurological:     Mental Status: She is alert.  Comments: Alert and oriented x 3. Normal insight and awareness. Intact Memory. Normal language and speech. Cranial nerve exam unremarkable. MMT: BUE 4/5 prox to 4+/5 distally with HI. BLE: 4- HF, KE and 4/5 ADF/PF. Stocking glove sensory loss below ankles and to a lesser extent in both hands. DTR's 1+. No abnl resting tone. SABRA    Psychiatric:        Mood and Affect: Mood normal.        Behavior: Behavior normal.     Comments: Soft spoken, pleasant     Results for orders placed or performed during the hospital encounter of 09/23/24 (from the past 48 hours)  Glucose, capillary     Status: Abnormal   Collection Time: 09/25/24  7:34 PM  Result Value Ref Range   Glucose-Capillary 103 (H) 70 - 99 mg/dL     Comment: Glucose reference range applies only to samples taken after fasting for at least 8 hours.  Glucose, capillary     Status: Abnormal   Collection Time: 09/25/24 11:31 PM  Result Value Ref Range   Glucose-Capillary 143 (H) 70 - 99 mg/dL    Comment: Glucose reference range applies only to samples taken after fasting for at least 8 hours.  Glucose, capillary     Status: Abnormal   Collection Time: 09/26/24  3:36 AM  Result Value Ref Range   Glucose-Capillary 206 (H) 70 - 99 mg/dL    Comment: Glucose reference range applies only to samples taken after fasting for at least 8 hours.  CBC     Status: Abnormal   Collection Time: 09/26/24  5:32 AM  Result Value Ref Range   WBC 16.1 (H) 4.0 - 10.5 K/uL   RBC 2.65 (L) 3.87 - 5.11 MIL/uL   Hemoglobin 9.8 (L) 12.0 - 15.0 g/dL   HCT 71.1 (L) 63.9 - 53.9 %   MCV 108.7 (H) 80.0 - 100.0 fL   MCH 37.0 (H) 26.0 - 34.0 pg   MCHC 34.0 30.0 - 36.0 g/dL   RDW 84.7 88.4 - 84.4 %   Platelets 226 150 - 400 K/uL   nRBC 0.7 (H) 0.0 - 0.2 %    Comment: Performed at [facility], [address] [place], [place] [address]  Basic metabolic panel with GFR     Status: Abnormal   Collection Time: 09/26/24  5:32 AM  Result Value Ref Range   Sodium 130 (L) 135 - 145 mmol/L   Potassium 3.8 3.5 - 5.1 mmol/L   Chloride 94 (L) 98 - 111 mmol/L   CO2 22 22 - 32 mmol/L   Glucose, Bld 166 (H) 70 - 99 mg/dL    Comment: Glucose reference range applies only to samples taken after fasting for at least 8 hours.   BUN 37 (H) 6 - 20 mg/dL   Creatinine, Ser 89.37 (H) 0.44 - 1.00 mg/dL   Calcium  8.6 (L) 8.9 - 10.3 mg/dL   GFR, Estimated 4 (L) >60 mL/min    Comment: (NOTE) Calculated using the CKD-EPI Creatinine Equation (2021)    Anion gap 14 5 - 15    Comment: Performed at [facility], [address], [place], [place] [address]  Magnesium      Status: None   Collection Time: 09/26/24  5:32 AM  Result Value Ref Range   Magnesium  2.3 1.7 - 2.4 mg/dL     Comment: Performed at [facility], [address], [place], [place] [address]  Phosphorus  Status: None   Collection Time: 09/26/24  5:32 AM  Result Value Ref Range   Phosphorus 4.3 2.5 - 4.6 mg/dL    Comment: Performed at [facility], [address], [place], [place] [address]  Glucose, capillary     Status: None   Collection Time: 09/26/24  7:55 AM  Result Value Ref Range   Glucose-Capillary 80 70 - 99 mg/dL    Comment: Glucose reference range applies only to samples taken after fasting for at least 8 hours.  Glucose, capillary     Status: None   Collection Time: 09/26/24 11:39 AM  Result Value Ref Range   Glucose-Capillary 86 70 - 99 mg/dL    Comment: Glucose reference range applies only to samples taken after fasting for at least 8 hours.  I-STAT 7, (LYTES, BLD GAS, ICA, H+H)     Status: Abnormal   Collection Time: 09/26/24  2:54 PM  Result Value Ref Range   pH, Arterial 7.322 (L) 7.35 - 7.45   pCO2 arterial 36.6 32 - 48 mmHg   pO2, Arterial 78 (L) 83 - 108 mmHg   Bicarbonate 18.9 (L) 20.0 - 28.0 mmol/L   TCO2 20 (L) 22 - 32 mmol/L   O2 Saturation 94 %   Acid-base deficit 7.0 (H) 0.0 - 2.0 mmol/L   Sodium 131 (L) 135 - 145 mmol/L   Potassium 4.2 3.5 - 5.1 mmol/L   Calcium , Ion 1.08 (L) 1.15 - 1.40 mmol/L   HCT 29.0 (L) 36.0 - 46.0 %   Hemoglobin 9.9 (L) 12.0 - 15.0 g/dL   Sample type ARTERIAL   POCT I-Stat EG7     Status: Abnormal   Collection Time: 09/26/24  2:58 PM  Result Value Ref Range   pH, Ven 7.300 7.25 - 7.43   pCO2, Ven 41.2 (L) 44 - 60 mmHg   pO2, Ven 34 32 - 45 mmHg   Bicarbonate 20.3 20.0 - 28.0 mmol/L   TCO2 22 22 - 32 mmol/L   O2 Saturation 60 %   Acid-base deficit 6.0 (H) 0.0 - 2.0 mmol/L   Sodium 131 (L) 135 - 145 mmol/L   Potassium 4.2 3.5 - 5.1 mmol/L   Calcium , Ion 1.09 (L) 1.15 - 1.40 mmol/L   HCT 30.0 (L) 36.0 - 46.0 %   Hemoglobin 10.2 (L) 12.0 - 15.0 g/dL   Sample type VENOUS    Comment NOTIFIED PHYSICIAN   Glucose,  capillary     Status: None   Collection Time: 09/26/24  4:47 PM  Result Value Ref Range   Glucose-Capillary 84 70 - 99 mg/dL    Comment: Glucose reference range applies only to samples taken after fasting for at least 8 hours.   Comment 1 Notify RN    Comment 2 Document in Chart   Glucose, capillary     Status: Abnormal   Collection Time: 09/26/24  8:08 PM  Result Value Ref Range   Glucose-Capillary 120 (H) 70 - 99 mg/dL    Comment: Glucose reference range applies only to samples taken after fasting for at least 8 hours.  Glucose, capillary     Status: Abnormal   Collection Time: 09/27/24 12:04 AM  Result Value Ref Range   Glucose-Capillary 154 (H) 70 - 99 mg/dL    Comment: Glucose reference range applies only to samples taken after fasting for at least 8 hours.   Comment 1 Document in Chart   CBC     Status: Abnormal   Collection Time:  09/27/24  3:56 AM  Result Value Ref Range   WBC 12.9 (H) 4.0 - 10.5 K/uL   RBC 2.57 (L) 3.87 - 5.11 MIL/uL   Hemoglobin 9.4 (L) 12.0 - 15.0 g/dL   HCT 72.1 (L) 63.9 - 53.9 %   MCV 108.2 (H) 80.0 - 100.0 fL   MCH 36.6 (H) 26.0 - 34.0 pg   MCHC 33.8 30.0 - 36.0 g/dL   RDW 84.2 (H) 88.4 - 84.4 %   Platelets 247 150 - 400 K/uL   nRBC 0.9 (H) 0.0 - 0.2 %    Comment: Performed at [facility], [address] [place], [place] [address]  Basic metabolic panel with GFR     Status: Abnormal   Collection Time: 09/27/24  3:56 AM  Result Value Ref Range   Sodium 131 (L) 135 - 145 mmol/L   Potassium 3.9 3.5 - 5.1 mmol/L   Chloride 94 (L) 98 - 111 mmol/L   CO2 20 (L) 22 - 32 mmol/L   Glucose, Bld 143 (H) 70 - 99 mg/dL    Comment: Glucose reference range applies only to samples taken after fasting for at least 8 hours.   BUN 38 (H) 6 - 20 mg/dL   Creatinine, Ser 88.99 (H) 0.44 - 1.00 mg/dL   Calcium  8.3 (L) 8.9 - 10.3 mg/dL   GFR, Estimated 4 (L) >60 mL/min    Comment: (NOTE) Calculated using the CKD-EPI Creatinine Equation (2021)    Anion gap  17 (H) 5 - 15    Comment: Performed at [facility], [address] [place], [place] [address]  Magnesium      Status: None   Collection Time: 09/27/24  3:56 AM  Result Value Ref Range   Magnesium  2.3 1.7 - 2.4 mg/dL    Comment: Performed at [facility], [address], [place], [place] [address]  Phosphorus     Status: Abnormal   Collection Time: 09/27/24  3:56 AM  Result Value Ref Range   Phosphorus 5.7 (H) 2.5 - 4.6 mg/dL    Comment: Performed at [facility], [address] [place], [place] [address]  Glucose, capillary     Status: Abnormal   Collection Time: 09/27/24  3:57 AM  Result Value Ref Range   Glucose-Capillary 149 (H) 70 - 99 mg/dL    Comment: Glucose reference range applies only to samples taken after fasting for at least 8 hours.   Comment 1 Document in Chart   Glucose, capillary     Status: None   Collection Time: 09/27/24  8:49 AM  Result Value Ref Range   Glucose-Capillary 85 70 - 99 mg/dL    Comment: Glucose reference range applies only to samples taken after fasting for at least 8 hours.   Comment 1 Notify RN    Comment 2 Document in Chart   Glucose, capillary     Status: None   Collection Time: 09/27/24 12:56 PM  Result Value Ref Range   Glucose-Capillary 83 70 - 99 mg/dL    Comment: Glucose reference range applies only to samples taken after fasting for at least 8 hours.   *Note: Due to a large number of results and/or encounters for the requested time period, some results have not been displayed. A complete set of results can be found in Results Review.   CARDIAC CATHETERIZATION Result Date: 09/26/2024 1.  Tortuous but normal coronary arteries. 2.  Left ventricular angiography was not performed.  EF  was severely reduced by echo. 3.  Right heart catheterization showed normal right atrial pressure, mild pulmonary hypertension, mildly elevated wedge pressure and normal cardiac output RA: 5 mmHg RV: 37/8 mmHg PW: 18 mmHg PA: 37/18 with a mean  of 23 mmHg Cardiac output/index: 5.59/2.94. Recommendations: Medical therapy for nonischemic cardiomyopathy.      Blood pressure 136/77, pulse 82, temperature 97.8 F (36.6 C), temperature source Oral, resp. rate 16, weight 78.1 kg, last menstrual period 03/31/2014, SpO2 100%.  Medical Problem List and Plan: 1. Functional deficits secondary to debility related to circulatory shock, adrenal insufficiency/pituitary apoplexy, new CHF  -patient may  shower  -ELOS/Goals: 7-10 days, mod I goals.   2.  Antithrombotics: -DVT/anticoagulation:  Mechanical: Sequential compression devices, below knee Bilateral lower extremities Pharmaceutical: Heparin   -antiplatelet therapy: Aspirin    3. Pain Management:  Tylenol  and Oxycodone  prn.  4. Mood/Behavior/Sleep: LCSW to follow for evaluation and support when available.   -antipsychotic agents: Buspar  10 mg BID prn for anxiety and Remeron  7.5 mg at bedtime.   -pt seems positive. Family very supportive 5. Neuropsych/cognition: This patient is capable of making decisions on her own behalf.  6. Skin/Wound Care: Routine pressure relief measures.   7. Fluids/Electrolytes/Nutrition: Monitor I&O and weight. Follow up labs CBC/CMP    -GERD: Omeprazole  40 mg bid    8. 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   9.ESRD: Continue nightly PD per nephrology.   -pt was independent with management of her PD at home 10.Hx of papillary thyroid  cancer s/p thyroidectomy: TSH normal, continue Synthroid .   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   12. HFrEF, without acute exacerbation: Nephrology to manage volume status. Cardiology following. Continue heart healthy diet and GDMT as tolerated.   13. Low back pain/Abdominal pain: present since dissection. Continue to monitor. MRI T and L spine negative, RUQ US  negative for acute findings.    -continue Lidocaine  patch   -pt is able to work through discomfort 14. HTN: Monitor BP per protocol, continue Metoprolol    15. HLD: Crestor  10 mg   16. HIV: CD4 445 on 10/15. Continue home [name]  and Descovy     17. T2DM: Monitor glucose ac/hs with SSI.   -associated peripheral neuropathy      Daphne LOISE Satterfield, NP 09/27/2024     [1]  Allergies Allergen Reactions   Genvoya [Elviteg-Cobic-Emtricit-Tenofaf] Hives   Lisinopril Cough   Aldactone  [Spironolactone ] Hives   Valium  [Diazepam ] Other (See Comments)    Lethargy (pt states she cannot take this again)   Tegaderm Ag Mesh [Silver] Itching

## 2024-09-27 NOTE — Procedures (Signed)
PD tx initation note:    PD treatment initiated via aseptic technique. Consent signed and in chart. Patient is alert and oriented. No complaints of pain. No specimen collected. PD exit site clean, dry and intact. Gentamycin and new dressing applied. Bedside RN educated on PD machine and how to contact tech support when PD machine alarms.

## 2024-09-27 NOTE — Progress Notes (Signed)
 Progress Note  Patient Name: Tanisia Tate Date of Encounter: 09/27/2024  Primary Cardiologist: Emeline FORBES Calender, DO   Subjective   Feels 'loopy' today. No other complaints.   Inpatient Medications    Scheduled Meds:  allopurinol   100 mg Oral Daily   aspirin   81 mg Oral Daily   budesonide  (PULMICORT ) nebulizer solution  0.25 mg Nebulization BID   calcitRIOL   0.25 mcg Oral Once per day on Monday Wednesday Friday   Chlorhexidine  Gluconate Cloth  6 each Topical Daily   dolutegravir   50 mg Oral Daily   emtricitabine -tenofovir  AF  1 tablet Oral Daily   feeding supplement  237 mL Oral BID BM   fludrocortisone   0.1 mg Oral Daily   gentamicin  cream  1 Application Topical Daily   heparin  injection (subcutaneous)  5,000 Units Subcutaneous Q8H   hydrocortisone  sod succinate (SOLU-CORTEF ) inj  50 mg Intravenous Q12H   insulin  aspart  0-9 Units Subcutaneous Q4H   levothyroxine   150 mcg Oral Q0600   lidocaine   1 patch Transdermal Q24H   loratadine   10 mg Oral QHS   midodrine   5 mg Oral TID WC   mirtazapine   7.5 mg Oral QHS   multivitamin  1 tablet Oral QHS   pantoprazole   40 mg Oral Daily   rosuvastatin   10 mg Oral Daily   sodium chloride  flush  3 mL Intravenous Q12H   Continuous Infusions:  sodium chloride      dialysis solution 1.5% low-MG/low-CA     PRN Meds: sodium chloride , acetaminophen , busPIRone , cetaphil, docusate sodium , heparin  sodium (porcine) 3,000 Units in dialysis solution 1.5% low-MG/low-CA 6,000 mL dialysis solution, heparin  sodium (porcine) 3,000 Units in dialysis solution 2.5% low-MG/low-CA 6,000 mL dialysis solution, HYDROmorphone  (DILAUDID ) injection, ondansetron  (ZOFRAN ) IV, mouth rinse, oxyCODONE , polyethylene glycol, sodium chloride  flush   Vital Signs    Vitals:   09/27/24 0000 09/27/24 0352 09/27/24 0747 09/27/24 0807  BP: 112/65 121/80 (!) 141/77   Pulse: 88 87 90   Resp: 13 12 18    Temp: 98.3 F (36.8 C) 98.4 F (36.9 C) 98.2 F (36.8 C)    TempSrc: Oral Oral Oral   SpO2: 98% 99% 99% 99%  Weight:   78.1 kg     Intake/Output Summary (Last 24 hours) at 09/27/2024 9072 Last data filed at 09/27/2024 0700 Gross per 24 hour  Intake 120 ml  Output 117 ml  Net 3 ml   Filed Weights   09/25/24 0544 09/26/24 0500 09/27/24 0747  Weight: 85.6 kg 83.4 kg 78.1 kg    Telemetry    NSR with PVCs - Personally Reviewed  ECG    NSR with LBBB and PVC - Personally Reviewed  Physical Exam   Physical Exam Vitals and nursing note reviewed.  Constitutional:      Comments: Appears somnolent but responds appropriately to questions  HENT:     Head: Normocephalic and atraumatic.  Eyes:     Conjunctiva/sclera: Conjunctivae normal.  Cardiovascular:     Rate and Rhythm: Normal rate and regular rhythm.  Pulmonary:     Effort: Pulmonary effort is normal.     Breath sounds: Normal breath sounds.  Musculoskeletal:        General: No swelling or tenderness.  Skin:    Coloration: Skin is not jaundiced or pale.  Neurological:     Mental Status: She is alert.      Labs    Chemistry Recent Labs  Lab 09/23/24 1431 09/24/24 0354 09/25/24 9462 09/26/24 0532  09/26/24 1454 09/26/24 1458 09/27/24 0356  NA 141   < > 130* 130* 131* 131* 131*  K 2.8*   < > 3.4* 3.8 4.2 4.2 3.9  CL 98   < > 93* 94*  --   --  94*  CO2 25   < > 20* 22  --   --  20*  GLUCOSE 101*   < > 117* 166*  --   --  143*  BUN 39*   < > 33* 37*  --   --  38*  CREATININE 14.09*   < > 11.08* 10.62*  --   --  11.00*  CALCIUM  8.1*   < > 8.4* 8.6*  --   --  8.3*  PROT 6.6  --   --   --   --   --   --   ALBUMIN  1.9*  --   --   --   --   --   --   AST 19  --   --   --   --   --   --   ALT 9  --   --   --   --   --   --   ALKPHOS 244*  --   --   --   --   --   --   BILITOT 1.0  --   --   --   --   --   --   GFRNONAA 3*   < > 4* 4*  --   --  4*  ANIONGAP 18*   < > 17* 14  --   --  17*   < > = values in this interval not displayed.     Hematology Recent Labs   Lab 09/25/24 0537 09/26/24 0532 09/26/24 1454 09/26/24 1458 09/27/24 0356  WBC 11.7* 16.1*  --   --  12.9*  RBC 2.53* 2.65*  --   --  2.57*  HGB 9.1* 9.8* 9.9* 10.2* 9.4*  HCT 27.5* 28.8* 29.0* 30.0* 27.8*  MCV 108.7* 108.7*  --   --  108.2*  MCH 36.0* 37.0*  --   --  36.6*  MCHC 33.1 34.0  --   --  33.8  RDW 14.8 15.2  --   --  15.7*  PLT 180 226  --   --  247    Cardiac EnzymesNo results for input(s): TROPONINI in the last 168 hours. No results for input(s): TROPIPOC in the last 168 hours.   BNP Recent Labs  Lab 09/24/24 0927  BNP 1,614.8*     DDimer No results for input(s): DDIMER in the last 168 hours.   Radiology    CARDIAC CATHETERIZATION Result Date: 09/26/2024 1.  Tortuous but normal coronary arteries. 2.  Left ventricular angiography was not performed.  EF was severely reduced by echo. 3.  Right heart catheterization showed normal right atrial pressure, mild pulmonary hypertension, mildly elevated wedge pressure and normal cardiac output RA: 5 mmHg RV: 37/8 mmHg PW: 18 mmHg PA: 37/18 with a mean of 23 mmHg Cardiac output/index: 5.59/2.94. Recommendations: Medical therapy for nonischemic cardiomyopathy.    Cardiac Studies   TTE 09/24/24:   1. Left ventricular ejection fraction, by estimation, is 30 to 35%. The  left ventricle has moderately decreased function. The left ventricle  demonstrates regional wall motion abnormalities (*see scoring  diagram/findings for description). There is mild  concentric left ventricular hypertrophy. Left ventricular diastolic  parameters are consistent with Grade II  diastolic dysfunction  (pseudonormalization). Elevated left ventricular end-diastolic pressure.   2. Right ventricular systolic function is normal. The right ventricular  size is normal.   3. Left atrial size was severely dilated.   4. The mitral valve is normal in structure. Moderate to severe mitral  valve regurgitation. No evidence of mitral stenosis.    5. The aortic valve is normal in structure. Aortic valve regurgitation is  not visualized. No aortic stenosis is present.   6. The inferior vena cava is normal in size with greater than 50%  respiratory variability, suggesting right atrial pressure of 3 mmHg.   *LV Wall Scoring:  The entire apex is akinetic. The anterior wall, antero-lateral wall, anterior  septum, inferior wall, posterior wall, mid inferoseptal segment, and basal  inferoseptal segment are hypokinetic.    L/RHC 09/26/2024: 1.  Tortuous but normal coronary arteries. 2.  Left ventricular angiography was not performed.  EF was severely reduced by echo. 3.  Right heart catheterization showed normal right atrial pressure, mild pulmonary hypertension, mildly elevated wedge pressure and normal cardiac output   RA: 5 mmHg RV: 37/8 mmHg PW: 18 mmHg PA: 37/18 with a mean of 23 mmHg Cardiac output/index: 5.59/2.94.    Patient Profile     This is a 58 year old female with past medical history of diabetes, coronary calcification on prior CT, ESRD on peritoneal dialysis, aortic dissection s/p repair 2024, sarcoidosis, upper extremity DVT, OSA, HIV, hypertension, Graves' disease, breast cancer previously on chemotherapy (carboplatin , docetaxel , trastuzumab  and Pertuzumab  changed to trastuzumab  due to diarrhea), pituitary apoplexy, left bundle branch block who presented to the ED from the dialysis center with worsening shortness of breath and malaise.  She had worsening back pain, abdominal pain and dyspnea and found to be profoundly hypotensive (60/30).  CTA chest abdomen/pelvis showed stable thoracoabdominal aortic aneurysm and dissection with no change in the endoluminal graft.  She was admitted to the ICU for circulatory shock and was started on empiric hydrocortisone  and fludrocortisone  for adrenal insufficiency.  Cardiology was consulted for newly reduced ejection fraction of 30 to 35%, wall motion abnormalities and moderate to severe  MR with elevated troponin.  She underwent right and left heart catheterization with Dr. Darron on 09/26/2024 which showed normal coronaries mild pulmonary hypertension with mildly elevated wedge pressure and normal cardiac output.  Assessment & Plan   Assessment & Plan:  New combined systolic and diastolic HF, likely due to stress cardiomyopathy-echo shows ejection fraction of 30 to 35% with an akinetic apex and hypokinesis of remaining segments.  R/LHC 12/11 unremarkable with PCWP 18 mmHg. GDMT: will start metoprolol  succinate 12.5 mg.  Volume management per nephrology Nonischemic cardiomyopathy, presumably stress cardiomyopathy normal coronaries on cath LBBB known Moderate to severe MR Circulatory shock, appears resolved - off pressors, on steroids. discussed with Dr. Meade who states patient is stable to proceed with cath. 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

## 2024-09-27 NOTE — Progress Notes (Signed)
 Inpatient Rehab Admissions Coordinator:    I have insurance approval and a bed available for pt to admit to CIR today. Dr. Lue in agreement and [facility] aware.  I will notify pt/family and make arrangements.    Reche Lowers, PT, DPT Admissions Coordinator [phone] 09/27/2024 3:30 PM

## 2024-09-27 NOTE — H&P (Signed)
 Physical Medicine and Rehabilitation Admission H&P        Chief Complaint  Patient presents with   Functional deficits due to debility    : HPI: [name] is a 58 year old female with PMHx Graves disease, HTN, T2DM, ESRD on PD, HIV, aortic dissection s/p repair 08/2023, recent hx of pituitary apoplexy, papillary thyroid  carcinoma s/p thyroidectomy, sarcoidosis, OSA, and breast cancer previously on chemotherapy presented to the ER ON 09/23/24 from dialysis with complaints of back pain, abdominal pain and dyspnea. EMS noted BP 90/60 with decreased radial pulses, BP in ER 64/30. She received IVF resuscitation via I/O improving blood pressures. A CTA chest/abd/pelv revealed stable thoracoabdominal aortic aneurysm and dissection with no significant change with no evidence of aortic leak or rupture, persistent pneumoperitoneum and pelvic free fluid in the setting of peritoneal dialysis catheter. The patient was recently admitted with similar presentation from 11/22-11/27 where workup was negative for PD catheter associated peritonitis.    EKG demonstrated left bundle branch lock with PACs. Labs: high-sensitivity troponins 338, 392, 331, 341, sodium of 130, K+ 2.8, BUN of 39, cr 14.09, mag 2.6, WBC 7K, and hemoglobin 11.8. CXR showed no active disease.  MRI of the lumbar and thoracic spine was unrevealing for any acute findings. Central line was placed to continue IV hydration. Nephrology consulted for ESRD/PD with recommendations for empiric hydrocortisone  and fludrocortisone  as adrenal insufficiency ws suspected. She was continued on PD with electrolyte replacement. CCM admitted for septic shock. An echocardiogram was completed showing decreased LVEF 30-35% with moderate to severe MR. Cardiology consulted and the patient underwent left and right heart catheterizations with normal findings. Vascular Surgery consulted for hypotension in the setting of aortic repair, indicating no further interventions  at this time.    Per chart review the patient lives in a two-level home with the ability to live on the first level. She was independent prior to arrival with use of rolling walker more recently and has become more sedentary overall. She currently requires min assist to stand, RW for support and min A to steady. Patient continues to be limited by decreased activity, impaired balance/postural reactions and global weakness. Therapy evaluations completed due to patient decreased functional mobility was admitted for a comprehensive rehab program.             Review of Systems  Constitutional:  Positive for malaise/fatigue. Negative for fever.  HENT:  Negative for ear discharge and hearing loss.   Eyes: Negative.   Respiratory:  Positive for shortness of breath.   Cardiovascular:  Negative for chest pain and palpitations.  Gastrointestinal:  Positive for abdominal pain and nausea.  Genitourinary:  Positive for flank pain.  Musculoskeletal:  Positive for back pain and myalgias.  Skin:  Negative for rash.  Neurological:  Positive for tingling, sensory change and weakness.  Psychiatric/Behavioral:  Negative for depression.                  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   COVID-19 virus infection 04/28/2022   Diabetes mellitus without complication (HCC)      type 2   Dissecting abdominal aortic aneurysm (HCC) 02/05/2023   DVT (deep venous thrombosis) (HCC)  08/29/2023    post-op DVT left IJ, brachial, axillary, subclavian veins 08/29/23   DVT of upper extremity (deep vein thrombosis) (HCC) 09/03/2023   Dysphagia 07/23/2019   Encephalopathy acute 02/02/2023   Family history of breast cancer     Family history of lung cancer     Family history of prostate cancer     Gastroenteritis 07/10/2007   Genetic testing 07/25/2018    [employer] + RNA Insight was ordered.  Genes Analyzed (43 total): APC*, ATM*, AXIN2, BARD1, BMPR1A, BRCA1*, BRCA2*, BRIP1*, CDH1*, CDK4, CDKN2A, CHEK2*, DICER1, GALNT12, HOXB13, MEN1, MLH1*, MRE11A, MSH2*, MSH3, MSH6*, MUTYH*, NBN, NF1*, NTHL1, PALB2*, PMS2*, POLD1, POLE, PTEN*, RAD50, RAD51C*, RAD51D*, RET, SDHB, SDHD, SMAD4, SMARCA4, STK11 and TP53* (sequencing and deletion/duplication); EGFR (s   GERD 07/24/2006   GRAVE'S DISEASE 01/01/2008   History of hidradenitis suppurativa     History of kidney stones     History of thrush     HIV DISEASE 07/24/2006    dx March 05   Hyperlipidemia     HYPERTENSION 07/24/2006   Hyperthyroidism 08/2006    Grave's Disease -diffuse radiotracer uptake 08/25/06 Thyroid  scan-Cold nodule to R lower lobe of thyrorid   Ileus (HCC) 02/14/2023   Left bundle branch block (LBBB)     Malnutrition of moderate degree 02/08/2023   Menometrorrhagia      hx of   Nephrolithiasis     Panniculitis 05/12/2020   Papillary adenocarcinoma of thyroid  (HCC)      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 Completed 2 yrs Prednisone  after lung bx confirmation. No symptoms since then.   Sebaceous cyst 04/21/2020   Suppurative hidradenitis     Thyroid  cancer (HCC)     THYROID  NODULE, RIGHT 02/08/2007             Past Surgical History:  Procedure Laterality Date   AORTIC ARCH DEBRANCHING N/A 08/25/2023    Procedure: AORTIC ARCH DEBRANCHING USING 12X8X8MM HEMASHIELD GRAFT;  Surgeon: Lucas Dorise POUR, MD;  [facility];  Service: Open Heart Surgery;  Laterality: N/A;  with bypasses to the innominate and left common carotid arteries   APPLICATION OF WOUND VAC N/A 01/20/2021    Procedure: APPLICATION OF WOUND VAC;  Surgeon: Lowery Estefana RAMAN, DO;  Location: [facility];  Service: Plastics;  Laterality: N/A;   BREAST EXCISIONAL BIOPSY Right 04/26/2018    right axilla negative   BREAST EXCISIONAL BIOPSY Left 04/26/2018    left axilla negative   BREAST LUMPECTOMY Right 10/03/2018    malignant   BREAST LUMPECTOMY WITH RADIOACTIVE SEED AND SENTINEL LYMPH NODE BIOPSY Right 10/03/2018  Procedure: RIGHT BREAST LUMPECTOMY WITH RADIOACTIVE SEED AND SENTINEL LYMPH NODE MAPPING;  Surgeon: Vanderbilt Ned, MD;  Location: [facility];  Service: General;  Laterality: Right;   BREAST SURGERY   1997    Breast Reduction    CAPD INSERTION N/A 12/21/2023    Procedure: LAPAROSCOPIC INSERTION CONTINUOUS AMBULATORY PERITONEAL DIALYSIS  (CAPD) CATHETER; LAPAROSCOPIC OMENTUMPEXY;  Surgeon: Magda Ned SAILOR, MD;  [facility];  Service: Vascular;  Laterality: N/A;   CYSTOSCOPY W/ URETERAL STENT REMOVAL   11/09/2012    Procedure: CYSTOSCOPY WITH STENT REMOVAL;  Surgeon: Ricardo Likens, MD;  Location: [facility];  Service: Urology;  Laterality: Right;   CYSTOSCOPY WITH RETROGRADE PYELOGRAM, URETEROSCOPY AND STENT PLACEMENT   11/09/2012    Procedure: CYSTOSCOPY WITH RETROGRADE PYELOGRAM, URETEROSCOPY AND STENT PLACEMENT;  Surgeon: Ricardo Likens, MD;  Location: [facility];   Service: Urology;  Laterality: Left;  LEFT URETEROSCOPY, STONE MANIPULATION, left STENT exchange     CYSTOSCOPY WITH STENT PLACEMENT   10/02/2012    Procedure: CYSTOSCOPY WITH STENT PLACEMENT;  Surgeon: Ricardo Likens, MD;  [facility]: [facility];  Service: Urology;  Laterality: Left;   DEBRIDEMENT AND CLOSURE WOUND N/A 01/20/2021    Procedure: Excision of abdominal wound with closure;  Surgeon: Lowery Estefana RAMAN, DO;  Location:  SURGERY CENTER;  Service: Plastics;  Laterality: N/A;   DILATION AND CURETTAGE OF UTERUS   11/2002    s/p for 1st trimester nonviable pregnancy   ESOPHAGOGASTRODUODENOSCOPY N/A 09/11/2024    Procedure: EGD (ESOPHAGOGASTRODUODENOSCOPY);  Surgeon: Abran Norleen SAILOR, MD;  Location: [facility];  Service: Gastroenterology;  Laterality: N/A;   EYE SURGERY        sty under eyelid   INCISE AND DRAIN ABCESS   08/2002    s/p I &D for righ inframmary fold hidradenitis   INCISION AND DRAINAGE PERITONSILLAR ABSCESS   12/2001   IR CV LINE INJECTION   06/07/2018   IR FLUORO GUIDE CV LINE RIGHT   01/30/2023   IR IMAGING GUIDED PORT INSERTION   06/20/2018   IR REMOVAL TUN ACCESS W/ PORT W/O FL MOD SED   06/20/2018   IR US  GUIDE VASC ACCESS RIGHT   01/30/2023   IRRIGATION AND DEBRIDEMENT ABSCESS   01/31/2012    Procedure: IRRIGATION AND DEBRIDEMENT ABSCESS;  Surgeon: Alm VEAR Angle, MD;  [facility];  Service: General;  Laterality: Right;  right breast and axilla    LAPAROSCOPIC REPOSITIONING CAPD CATHETER Left 01/10/2024    Procedure: Removal of peritoneal dialysis Catheter.;  Surgeon: Magda Ned SAILOR, MD;  Location: [facility] OR;  Service: Vascular;  Laterality: Left;   LAPAROSCOPY N/A 01/10/2024    Procedure: LAPAROSCOPY, DIAGNOSTIC;  Surgeon: Magda Ned SAILOR, MD;  Location: [facility] OR;  Service: Vascular;  Laterality: N/A;   NEPHROLITHOTOMY   10/02/2012    Procedure: NEPHROLITHOTOMY PERCUTANEOUS;  Surgeon: Ricardo Likens, MD;  [facility];  Service: Urology;  Laterality:  Right;  First Stage Percutaneous Nephrolithotomy with Surgeon Access, Left Ureteral Stent      NEPHROLITHOTOMY   10/04/2012    Procedure: NEPHROLITHOTOMY PERCUTANEOUS SECOND LOOK;  Surgeon: Ricardo Likens, MD;  [facility];  Service: Urology;  Laterality: Right;       NEPHROLITHOTOMY   10/08/2012    Procedure: NEPHROLITHOTOMY PERCUTANEOUS;  Surgeon: Ricardo Likens, MD;  [facility];  Service: Urology;  Laterality: Right;  THIRD STAGE, nephrostomy tube exchange x 2   NEPHROLITHOTOMY   10/11/2012    Procedure: NEPHROLITHOTOMY PERCUTANEOUS SECOND LOOK;  Surgeon:  Ricardo Likens, MD;  [facility]: [facility];  Service: Urology;  Laterality: Right;  RIGHT 4 STAGE PERCUTANOUS NEPHROLITHOTOMY, right URETEROSCOPY WITH HOLMIUM LASER    PANNICULECTOMY N/A 12/21/2020    Procedure: PANNICULECTOMY;  Surgeon: Lowery Estefana RAMAN, DO;  [facility];  Service: Plastics;  Laterality: N/A;   PERCUTANEOUS NEPHROSTOLITHOTOMY   04/2022   PORT-A-CATH REMOVAL N/A 07/16/2020    Procedure: REMOVAL PORT-A-CATH;  Surgeon: Vanderbilt Ned, MD;  Location: [facility];  Service: General;  Laterality: N/A;   PORTACATH PLACEMENT Left 05/17/2018    Procedure: INSERTION PORT-A-CATH;  Surgeon: Vernetta Berg, MD;  Location: [facility];  Service: General;  Laterality: Left;   RADICAL NECK DISSECTION   04/12/2017    limited/notes 04/12/2017   RADICAL NECK DISSECTION N/A 04/12/2017    Procedure: RADICAL NECK DISSECTION;  Surgeon: Carlie Clark, MD;  Location: [facility];  Service: ENT;  Laterality: N/A;  limited neck dissection 2 hours total   REDUCTION MAMMAPLASTY Bilateral 1998   RIGHT/LEFT HEART CATH AND CORONARY ANGIOGRAPHY N/A 03/12/2020    Procedure: RIGHT/LEFT HEART CATH AND CORONARY ANGIOGRAPHY;  Surgeon: Cherrie Toribio SAUNDERS, MD;  [facility];  Service: Cardiovascular;  Laterality: N/A;   RIGHT/LEFT HEART CATH AND CORONARY ANGIOGRAPHY N/A 09/26/2024    Procedure:  RIGHT/LEFT HEART CATH AND CORONARY ANGIOGRAPHY;  Surgeon: Darron Deatrice LABOR, MD;  [facility];  Service: Cardiovascular;  Laterality: N/A;   [place]   1994   TEE WITHOUT CARDIOVERSION N/A 08/25/2023    Procedure: TRANSESOPHAGEAL ECHOCARDIOGRAM;  Surgeon: Lucas Dorise POUR, MD;  [facility];  Service: Open Heart Surgery;  Laterality: N/A;   TENCKHOFF CATHETER INSERTION Left 01/10/2024    Procedure: INSERTION, CATHETER, DIALYSIS, PERITONEAL;  Surgeon: Magda Ned SAILOR, MD;  [facility];  Service: Vascular;  Laterality: Left;   THORACIC AORTIC ENDOVASCULAR STENT GRAFT N/A 02/02/2023    Procedure: THORACIC AORTIC ENDOVASCULAR STENT GRAFT;  Surgeon: Lanis Fonda BRAVO, MD;  Location: [facility] OR;  Service: Vascular;  Laterality: N/A;   THORACIC AORTIC ENDOVASCULAR STENT GRAFT N/A 08/25/2023    Procedure: THORACIC AORTIC ENDOVASCULAR STENT GRAFT;  Surgeon: Lanis Fonda BRAVO, MD;  Location: [facility] OR;  Service: Vascular;  Laterality: N/A;   THYROIDECTOMY   04/12/2017    completion/notes 04/12/2017   THYROIDECTOMY N/A 04/12/2017    Procedure: THYROIDECTOMY;  Surgeon: Carlie Clark, MD;  Location: [facility] OR;  Service: ENT;  Laterality: N/A;  Completion Thyroidectomy   TOTAL THYROIDECTOMY   2010   ULTRASOUND GUIDANCE FOR VASCULAR ACCESS Right 08/25/2023    Procedure: ULTRASOUND GUIDANCE FOR VASCULAR ACCESS, RIGHT FEMORAL ARTERY;  Surgeon: Lanis Fonda BRAVO, MD;  Location: [facility] OR;  Service: Vascular;  Laterality: Right;             Family History  Problem Relation Age of Onset   Hypertension Mother     Cancer Mother          laryngeal   Heart disease Mother          stent   Pancreatic cancer Father     Hypertension Father     Lung cancer Father 2        hx smoking   Hypertension Sister     Hypertension Sister     Hypertension Brother     Breast cancer Maternal Aunt 43   Breast cancer Maternal Aunt          dx 60+   Cancer Maternal Uncle  Lung CA   Breast cancer Paternal Aunt 61    Breast cancer Paternal Aunt          dx 85's   Breast cancer Paternal Aunt          dx 50's   Prostate cancer Paternal Uncle     Prostate cancer Paternal Uncle     Lung cancer Paternal Uncle     Breast cancer Cousin 55   Breast cancer Cousin          dx <50   Breast cancer Cousin          dx <50   Breast cancer Cousin          dx <50   Heart disease Other     Hypertension Other     Stroke Other          Grandparent   Kidney disease Other          Grandparent   Diabetes Other          FH of Diabetes   Colon cancer Neg Hx     Esophageal cancer Neg Hx     Rectal cancer Neg Hx     Stomach cancer Neg Hx          Social History:  reports that she quit smoking about 6 years ago. Her smoking use included cigarettes. She started smoking about 7 years ago. She has a 7.5 pack-year smoking history. She has never used smokeless tobacco. She reports that she does not currently use alcohol . She reports that she does not use drugs. Allergies: [Allergies]  [Allergies]      Allergen Reactions   Genvoya [Elviteg-Cobic-Emtricit-Tenofaf] Hives   Lisinopril Cough   Aldactone  [Spironolactone ] Hives   Valium  [Diazepam ] Other (See Comments)      Lethargy (pt states she cannot take this again)   Tegaderm Ag Mesh [Silver] Itching         Medications Prior to Admission  Medication Sig Dispense Refill   acetaminophen  (TYLENOL ) 500 MG tablet Take 1,000 mg by mouth every 6 (six) hours as needed for mild pain.       allopurinol  (ZYLOPRIM ) 100 MG tablet TAKE 1 TABLET (100 MG TOTAL) BY MOUTH DAILY. FOR GOUT PREVENTION 90 tablet 2   aspirin  81 MG chewable tablet Chew 81 mg by mouth daily.       AURYXIA  1 GM 210 MG(Fe) tablet Take 210 mg by mouth 3 (three) times daily with meals.       busPIRone  (BUSPAR ) 5 MG tablet TAKE 1 TABLET BY MOUTH TWICE A DAY FOR ANXIETY 180 tablet 0   dolutegravir  ([name] ) 50 MG tablet Take 1 tablet (50 mg total) by mouth daily. 30 tablet 11   emtricitabine -tenofovir  AF  (DESCOVY ) 200-25 MG tablet Take 1 tablet by mouth daily. 30 tablet 11   fluticasone  (FLOVENT  HFA) 110 MCG/ACT inhaler Inhale 1 puff into the lungs 2 (two) times daily.       levocetirizine (XYZAL ) 5 MG tablet TAKE 1 TABLET BY MOUTH EVERY DAY IN THE EVENING FOR ALLERGIES 90 tablet 1   levothyroxine  (SYNTHROID ) 150 MCG tablet TAKE 1 TABLET BY MOUTH EVERY DAY BEFORE BREAKFAST 30 tablet 0   lidocaine  (LIDODERM ) 5 % PLACE 1 PATCH ONTO THE SKIN DAILY. REMOVE & DISCARD PATCH WITHIN 12 HOURS OR AS DIRECTED BY MD (Patient taking differently: Place 1 patch onto the skin daily as needed. Remove & Discard patch within 12 hours or as directed by MD) 30 patch  0   minocycline  (MINOCIN ) 100 MG capsule Take 1 capsule (100 mg total) by mouth 2 (two) times daily. 60 capsule 5   omeprazole  (PRILOSEC) 40 MG capsule Take 1 capsule (40 mg total) by mouth 2 (two) times daily. for heartburn. 90 capsule 2   ondansetron  (ZOFRAN -ODT) 4 MG disintegrating tablet Take 1 tablet (4 mg total) by mouth every 8 (eight) hours as needed for nausea or vomiting. 20 tablet 0   rosuvastatin  (CRESTOR ) 10 MG tablet TAKE 1 TABLET BY MOUTH EVERY DAY FOR CHOLESTEROL 90 tablet 2   sevelamer  carbonate (RENVELA ) 800 MG tablet Take 800 mg by mouth 3 (three) times daily with meals.       tiZANidine  (ZANAFLEX ) 4 MG tablet Take 1-2 tablets (4-8 mg total) by mouth every 8 (eight) hours as needed for muscle spasms. 90 tablet 0   calcitRIOL  (ROCALTROL ) 0.25 MCG capsule Take 0.25 mcg by mouth 3 (three) times a week. (Patient not taking: Reported on 09/23/2024)       Lancets (ONETOUCH ULTRASOFT) lancets Use as instructed to test blood sugar daily 100 each 5              Home: Home Living Family/patient expects to be discharged to:: Private residence Living Arrangements: Alone Available Help at Discharge: Family, Available 24 hours/day Type of Home: Other(Comment) (townhome) Home Access: Stairs to enter [employer] of Steps: 2 Entrance  Stairs-Rails: None Home Layout: Two level, 1/2 bath on main level, Bed/bath upstairs, Able to live on main level with bedroom/bathroom Alternate Level Stairs-Number of Steps: 10 Alternate Level Stairs-Rails: Right, Left, Can reach both Bathroom Shower/Tub: Tub/shower unit Bathroom Toilet: Standard Bathroom Accessibility: Yes Home Equipment: [employer] (2 wheels)   Functional History: Prior Function Prior Level of Function : Independent/Modified Independent Mobility Comments: recently using RW, spending more and more time upstairs in her room. ADLs Comments: Mod I with ADL's prior to one month ago; for the past month, sister assisting intermittently, predominantly with bathing and bringing meals   Functional Status:  Mobility: Bed Mobility Overal bed mobility: Needs Assistance Bed Mobility: Supine to Sit, Sit to Supine Supine to sit: Supervision Sit to supine: Min assist General bed mobility comments: pt long sitting in bed on arrival, able to transition to EOB without assist, min A to manage LEs back to bed Transfers Overall transfer level: Needs assistance Equipment used: Rolling walker (2 wheels) Transfers: Sit to/from Stand Sit to Stand: Min assist General transfer comment: light min A to steady on rise to stand from EOB at lowest height, x2 during session Ambulation/Gait Ambulation/Gait assistance: Min assist Gait Distance (Feet): 12 Feet (+ 36') Assistive device: Rolling walker (2 wheels) Gait Pattern/deviations: Step-through pattern, Decreased stride length, Wide base of support, Shuffle General Gait Details: tremors noted and pt reported feeling shaky with knees bouncing but not buckling. HR up to 106bpm Gait velocity: decreased Gait velocity interpretation: <1.31 ft/sec, indicative of household ambulator   ADL: ADL Overall ADL's : Needs assistance/impaired Eating/Feeding: NPO Grooming: Contact guard assist, Standing Upper Body Bathing: Set up, Sitting Lower  Body Bathing: Minimal assistance, Sit to/from stand Upper Body Dressing : Set up, Sitting Lower Body Dressing: Minimal assistance, Sit to/from stand Toilet Transfer: Contact guard assist, Rolling walker (2 wheels), Ambulation Functional mobility during ADLs: Contact guard assist, Rolling walker (2 wheels)   Cognition: Cognition Orientation Level: Oriented X4 Cognition Arousal: Alert Behavior During Therapy: Flat affect   Physical Exam: Blood pressure 136/77, pulse 82, temperature 97.8 F (36.6 C), temperature  source Oral, resp. rate 16, weight 78.1 kg, last menstrual period 03/31/2014, SpO2 100%. Physical Exam Constitutional:      General: She is not in acute distress.    Appearance: She is obese. She is ill-appearing.  HENT:     Head: Normocephalic and atraumatic.     Right Ear: External ear normal.     Left Ear: External ear normal.     Nose: Nose normal.     Mouth/Throat:     Pharynx: Oropharynx is clear.  Eyes:     Conjunctiva/sclera: Conjunctivae normal.     Pupils: Pupils are equal, round, and reactive to light.  Cardiovascular:     Rate and Rhythm: Normal rate and regular rhythm.     Heart sounds: No murmur heard.    No gallop.  Pulmonary:     Effort: Pulmonary effort is normal. No respiratory distress.     Breath sounds: No wheezing.  Abdominal:     General: There is no distension.     Palpations: Abdomen is soft.     Tenderness: There is abdominal tenderness.  Musculoskeletal:        General: Tenderness (low back, right flank) present. Normal range of motion.     Cervical back: Normal range of motion.  Skin:    General: Skin is dry.     Comments: PD site clean, dressed. A few scattered bruises, dry skin  Neurological:     Mental Status: She is alert.     Comments: Alert and oriented x 3. Normal insight and awareness. Intact Memory. Normal language and speech. Cranial nerve exam unremarkable. MMT: BUE 4/5 prox to 4+/5 distally with HI. BLE: 4- HF, KE and 4/5  ADF/PF. Stocking glove sensory loss below ankles and to a lesser extent in both hands. DTR's 1+. No abnl resting tone. SABRA    Psychiatric:        Mood and Affect: Mood normal.        Behavior: Behavior normal.     Comments: Soft spoken, pleasant       Lab Results Last 48 Hours        Results for orders placed or performed during the hospital encounter of 09/23/24 (from the past 48 hours)  Glucose, capillary     Status: Abnormal    Collection Time: 09/25/24  7:34 PM  Result Value Ref Range    Glucose-Capillary 103 (H) 70 - 99 mg/dL      Comment: Glucose reference range applies only to samples taken after fasting for at least 8 hours.  Glucose, capillary     Status: Abnormal    Collection Time: 09/25/24 11:31 PM  Result Value Ref Range    Glucose-Capillary 143 (H) 70 - 99 mg/dL      Comment: Glucose reference range applies only to samples taken after fasting for at least 8 hours.  Glucose, capillary     Status: Abnormal    Collection Time: 09/26/24  3:36 AM  Result Value Ref Range    Glucose-Capillary 206 (H) 70 - 99 mg/dL      Comment: Glucose reference range applies only to samples taken after fasting for at least 8 hours.  CBC     Status: Abnormal    Collection Time: 09/26/24  5:32 AM  Result Value Ref Range    WBC 16.1 (H) 4.0 - 10.5 K/uL    RBC 2.65 (L) 3.87 - 5.11 MIL/uL    Hemoglobin 9.8 (L) 12.0 - 15.0 g/dL    HCT  28.8 (L) 36.0 - 46.0 %    MCV 108.7 (H) 80.0 - 100.0 fL    MCH 37.0 (H) 26.0 - 34.0 pg    MCHC 34.0 30.0 - 36.0 g/dL    RDW 84.7 88.4 - 84.4 %    Platelets 226 150 - 400 K/uL    nRBC 0.7 (H) 0.0 - 0.2 %      Comment: Performed at [facility], [address] [place], [place] [address]  Basic metabolic panel with GFR     Status: Abnormal    Collection Time: 09/26/24  5:32 AM  Result Value Ref Range    Sodium 130 (L) 135 - 145 mmol/L    Potassium 3.8 3.5 - 5.1 mmol/L    Chloride 94 (L) 98 - 111 mmol/L    CO2 22 22 - 32 mmol/L    Glucose, Bld 166 (H)  70 - 99 mg/dL      Comment: Glucose reference range applies only to samples taken after fasting for at least 8 hours.    BUN 37 (H) 6 - 20 mg/dL    Creatinine, Ser 89.37 (H) 0.44 - 1.00 mg/dL    Calcium  8.6 (L) 8.9 - 10.3 mg/dL    GFR, Estimated 4 (L) >60 mL/min      Comment: (NOTE) Calculated using the CKD-EPI Creatinine Equation (2021)      Anion gap 14 5 - 15      Comment: Performed at [facility], [address], [place], [place] [address]  Magnesium      Status: None    Collection Time: 09/26/24  5:32 AM  Result Value Ref Range    Magnesium  2.3 1.7 - 2.4 mg/dL      Comment: Performed at [facility], [address], [place], [place] [address]  Phosphorus     Status: None    Collection Time: 09/26/24  5:32 AM  Result Value Ref Range    Phosphorus 4.3 2.5 - 4.6 mg/dL      Comment: Performed at [facility], [address], [place], [place] [address]  Glucose, capillary     Status: None    Collection Time: 09/26/24  7:55 AM  Result Value Ref Range    Glucose-Capillary 80 70 - 99 mg/dL      Comment: Glucose reference range applies only to samples taken after fasting for at least 8 hours.  Glucose, capillary     Status: None    Collection Time: 09/26/24 11:39 AM  Result Value Ref Range    Glucose-Capillary 86 70 - 99 mg/dL      Comment: Glucose reference range applies only to samples taken after fasting for at least 8 hours.  I-STAT 7, (LYTES, BLD GAS, ICA, H+H)     Status: Abnormal    Collection Time: 09/26/24  2:54 PM  Result Value Ref Range    pH, Arterial 7.322 (L) 7.35 - 7.45    pCO2 arterial 36.6 32 - 48 mmHg    pO2, Arterial 78 (L) 83 - 108 mmHg    Bicarbonate 18.9 (L) 20.0 - 28.0 mmol/L    TCO2 20 (L) 22 - 32 mmol/L    O2 Saturation 94 %    Acid-base deficit 7.0 (H) 0.0 - 2.0 mmol/L    Sodium 131 (L) 135 - 145 mmol/L    Potassium 4.2 3.5 - 5.1 mmol/L    Calcium , Ion 1.08 (L) 1.15 - 1.40 mmol/L    HCT 29.0 (  L) 36.0 - 46.0 %    Hemoglobin 9.9 (L)  12.0 - 15.0 g/dL    Sample type ARTERIAL    POCT I-Stat EG7     Status: Abnormal    Collection Time: 09/26/24  2:58 PM  Result Value Ref Range    pH, Ven 7.300 7.25 - 7.43    pCO2, Ven 41.2 (L) 44 - 60 mmHg    pO2, Ven 34 32 - 45 mmHg    Bicarbonate 20.3 20.0 - 28.0 mmol/L    TCO2 22 22 - 32 mmol/L    O2 Saturation 60 %    Acid-base deficit 6.0 (H) 0.0 - 2.0 mmol/L    Sodium 131 (L) 135 - 145 mmol/L    Potassium 4.2 3.5 - 5.1 mmol/L    Calcium , Ion 1.09 (L) 1.15 - 1.40 mmol/L    HCT 30.0 (L) 36.0 - 46.0 %    Hemoglobin 10.2 (L) 12.0 - 15.0 g/dL    Sample type VENOUS      Comment NOTIFIED PHYSICIAN    Glucose, capillary     Status: None    Collection Time: 09/26/24  4:47 PM  Result Value Ref Range    Glucose-Capillary 84 70 - 99 mg/dL      Comment: Glucose reference range applies only to samples taken after fasting for at least 8 hours.    Comment 1 Notify RN      Comment 2 Document in Chart    Glucose, capillary     Status: Abnormal    Collection Time: 09/26/24  8:08 PM  Result Value Ref Range    Glucose-Capillary 120 (H) 70 - 99 mg/dL      Comment: Glucose reference range applies only to samples taken after fasting for at least 8 hours.  Glucose, capillary     Status: Abnormal    Collection Time: 09/27/24 12:04 AM  Result Value Ref Range    Glucose-Capillary 154 (H) 70 - 99 mg/dL      Comment: Glucose reference range applies only to samples taken after fasting for at least 8 hours.    Comment 1 Document in Chart    CBC     Status: Abnormal    Collection Time: 09/27/24  3:56 AM  Result Value Ref Range    WBC 12.9 (H) 4.0 - 10.5 K/uL    RBC 2.57 (L) 3.87 - 5.11 MIL/uL    Hemoglobin 9.4 (L) 12.0 - 15.0 g/dL    HCT 72.1 (L) 63.9 - 46.0 %    MCV 108.2 (H) 80.0 - 100.0 fL    MCH 36.6 (H) 26.0 - 34.0 pg    MCHC 33.8 30.0 - 36.0 g/dL    RDW 84.2 (H) 88.4 - 15.5 %    Platelets 247 150 - 400 K/uL    nRBC 0.9 (H) 0.0 - 0.2 %      Comment: Performed at [facility],  [address] [place], [place] [address]  Basic metabolic panel with GFR     Status: Abnormal    Collection Time: 09/27/24  3:56 AM  Result Value Ref Range    Sodium 131 (L) 135 - 145 mmol/L    Potassium 3.9 3.5 - 5.1 mmol/L    Chloride 94 (L) 98 - 111 mmol/L    CO2 20 (L) 22 - 32 mmol/L    Glucose, Bld 143 (H) 70 - 99 mg/dL      Comment: Glucose reference range applies only to samples taken after  fasting for at least 8 hours.    BUN 38 (H) 6 - 20 mg/dL    Creatinine, Ser 88.99 (H) 0.44 - 1.00 mg/dL    Calcium  8.3 (L) 8.9 - 10.3 mg/dL    GFR, Estimated 4 (L) >60 mL/min      Comment: (NOTE) Calculated using the CKD-EPI Creatinine Equation (2021)      Anion gap 17 (H) 5 - 15      Comment: Performed at [facility], [address], [name], [place] [address]  Magnesium      Status: None    Collection Time: 09/27/24  3:56 AM  Result Value Ref Range    Magnesium  2.3 1.7 - 2.4 mg/dL      Comment: Performed at [facility], [address], [place], [place] [address]  Phosphorus     Status: Abnormal    Collection Time: 09/27/24  3:56 AM  Result Value Ref Range    Phosphorus 5.7 (H) 2.5 - 4.6 mg/dL      Comment: Performed at [facility], [address], [place], [place] [address]  Glucose, capillary     Status: Abnormal    Collection Time: 09/27/24  3:57 AM  Result Value Ref Range    Glucose-Capillary 149 (H) 70 - 99 mg/dL      Comment: Glucose reference range applies only to samples taken after fasting for at least 8 hours.    Comment 1 Document in Chart    Glucose, capillary     Status: None    Collection Time: 09/27/24  8:49 AM  Result Value Ref Range    Glucose-Capillary 85 70 - 99 mg/dL      Comment: Glucose reference range applies only to samples taken after fasting for at least 8 hours.    Comment 1 Notify RN      Comment 2 Document in Chart    Glucose, capillary     Status: None    Collection Time: 09/27/24 12:56 PM  Result Value Ref Range     Glucose-Capillary 83 70 - 99 mg/dL      Comment: Glucose reference range applies only to samples taken after fasting for at least 8 hours.    *Note: Due to a large number of results and/or encounters for the requested time period, some results have not been displayed. A complete set of results can be found in Results Review.      Imaging Results (Last 48 hours)  CARDIAC CATHETERIZATION Result Date: 09/26/2024 1.  Tortuous but normal coronary arteries. 2.  Left ventricular angiography was not performed.  EF was severely reduced by echo. 3.  Right heart catheterization showed normal right atrial pressure, mild pulmonary hypertension, mildly elevated wedge pressure and normal cardiac output RA: 5 mmHg RV: 37/8 mmHg PW: 18 mmHg PA: 37/18 with a mean of 23 mmHg Cardiac output/index: 5.59/2.94. Recommendations: Medical therapy for nonischemic cardiomyopathy.            Blood pressure 136/77, pulse 82, temperature 97.8 F (36.6 C), temperature source Oral, resp. rate 16, weight 78.1 kg, last menstrual period 03/31/2014, SpO2 100%.   Medical Problem List and Plan: 1. Functional deficits secondary to debility related to circulatory shock, adrenal insufficiency/pituitary apoplexy, new CHF             -patient may  shower             -ELOS/Goals: 7-10 days, mod I goals.   -family is looking at some  options for intermittent assistance at home   2.  Antithrombotics: -DVT/anticoagulation:  Mechanical: Sequential compression devices, below knee Bilateral lower extremities Pharmaceutical: Heparin              -antiplatelet therapy: Aspirin     3. Pain Management:  Tylenol  and Oxycodone  prn.   4. Mood/Behavior/Sleep: LCSW to follow for evaluation and support when available.              -antipsychotic agents: Buspar  10 mg BID prn for anxiety and Remeron  7.5 mg at bedtime.              -pt seems positive. Family very supportive 5. Neuropsych/cognition: This patient is capable of making decisions on her  own behalf.   6. Skin/Wound Care: Routine pressure relief measures.    7. Fluids/Electrolytes/Nutrition: Monitor I&O and weight. Follow up labs CBC/CMP               -GERD: Omeprazole  40 mg bid    8. 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    9.ESRD: Continue nightly PD per nephrology.              -pt was independent with management of her PD at home 10.Hx of papillary thyroid  cancer s/p thyroidectomy: TSH normal, continue Synthroid .    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    12. HFrEF, without acute exacerbation: Nephrology to manage volume status. Cardiology following. Continue heart healthy diet and GDMT as tolerated.    13. Low back pain/Abdominal pain: present since dissection. Continue to monitor. MRI T and L spine negative, RUQ US  negative for acute findings.              -continue Lidocaine  patch              -pt is able to work through discomfort 14. HTN: Monitor BP per protocol, continue Metoprolol     15. HLD: Crestor  10 mg    16. HIV: CD4 445 on 10/15. Continue home [name]  and Descovy                17. T2DM: Monitor glucose ac/hs with SSI.              -associated peripheral neuropathy         Daphne LOISE Satterfield, NP 09/27/2024  I have personally performed a face to face diagnostic evaluation of this patient and formulated the key components of the plan.  Additionally, I have personally reviewed laboratory data, imaging studies, as well as relevant notes and concur with the physician assistant's documentation above.  The patient's status has not changed from the original H&P.  Any changes in documentation from the acute care chart have been noted above.  Arthea IVAR Gunther, MD, LEELLEN

## 2024-09-27 NOTE — Progress Notes (Signed)
 Inpatient Rehab Admissions Coordinator:   Awaiting determination from [facility] regarding CIR prior auth request.  Will follow.   Reche Lowers, PT, DPT Admissions Coordinator [phone] 09/27/2024 2:11 PM

## 2024-09-27 NOTE — Progress Notes (Signed)
 Occupational Therapy Treatment Patient Details Name: [name] MRN: [id number] DOB: Mar 25, 1966 Today's Date: 09/27/2024   History of present illness 58 y.o. female presenting 12/8 from dialysis for back pain, abdominal pain, and dyspnea. BP found to be 64/30; admitted to ICU for shock. 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 PMH: ESRD on PD, anemia, anxiety, asthma, T2DM, aortic dissection s/p endovascular repair, GERD, Grave's disease, HTN, HLD, papillary thyroid  cancer s/p thyroidectomy (2018) PVD, falls, sarcoidosis, suppurative hydradenitis   OT comments  Pt in bed upon therapy arrival with Daughter visiting at bedside. Pt agreeable to participate in OT treatment session. Session focused primarily on activity tolerance and endurance while participating in BUE strengthening exercises utilizing yellow theraband. Pt was provided with verbal instructions and visual demonstration for proper form and technique. Pt demonstrated understanding with occasional cueing required. HEP handout provided along with yellow band. Pt required frequent rest breaks d/t fatigue. Pt was able to self monitor fatigue level. Patient will benefit from intensive inpatient follow-up therapy, >3 hours/day.        If plan is discharge home, recommend the following:  A little help with walking and/or transfers;A little help with bathing/dressing/bathroom;Assistance with cooking/housework;Assist for transportation;Help with stairs or ramp for entrance   Equipment Recommendations  Tub/shower seat       Precautions / Restrictions Precautions Precautions: Fall Recall of Precautions/Restrictions: Intact Precaution/Restrictions Comments: PD catheter Restrictions Weight Bearing Restrictions Per Provider Order: No       Mobility Bed Mobility Overal bed mobility: Needs Assistance Bed Mobility: Supine to Sit, Sit to  Supine     Supine to sit: HOB elevated, Supervision Sit to supine: Supervision, HOB elevated        Transfers Overall transfer level: Needs assistance Equipment used: Rolling walker (2 wheels) Transfers: Sit to/from Stand Sit to Stand: Supervision, From elevated surface           General transfer comment: Close SBA for safety. Pt required no physical assistance to stand.     Balance Overall balance assessment: Needs assistance Sitting-balance support: Bilateral upper extremity supported, Feet supported Sitting balance-Leahy Scale: Good     Standing balance support: Bilateral upper extremity supported, Reliant on assistive device for balance Standing balance-Leahy Scale: Poor Standing balance comment: UE support on walker          ADL either performed or assessed with clinical judgement   ADL    Upper Body Dressing : Set up;Sitting (gown change X2)               Communication Communication Communication: No apparent difficulties   Cognition Arousal: Alert Behavior During Therapy: Flat affect Cognition: No apparent impairments      Following commands: Intact        Cueing   Cueing Techniques: Verbal cues  Exercises General Exercises - Upper Extremity Shoulder Horizontal ABduction: Strengthening, Both, 5 reps, Seated, Theraband (2 sets) Theraband Level (Shoulder Horizontal Abduction): Level 1 (Yellow) Elbow Flexion: Strengthening, Both, 5 reps, Seated, Theraband Theraband Level (Elbow Flexion): Level 1 (Yellow) Elbow Extension: Strengthening, Both, 5 reps, Seated, Theraband Theraband Level (Elbow Extension): Level 1 (Yellow) Shoulder Exercises Shoulder External Rotation: Strengthening, Both, 5 reps, Seated, Theraband (2 sets) Theraband Level (Shoulder External Rotation): Level 1 (Yellow) Other Exercises Other Exercises: seated, BUE, PNF pattern, yellow theraband, 1 set, 5X.       General Comments VSS on RA    Pertinent Vitals/ Pain  Pain  Assessment Pain Assessment: No/denies pain Pain Score: 0-No pain         Frequency  Min 2X/week        Progress Toward Goals  OT Goals(current goals can now be found in the care plan section)  Progress towards OT goals: Progressing toward goals            AM-PAC OT 6 Clicks Daily Activity     Outcome Measure   Help from another person eating meals?: Total Help from another person taking care of personal grooming?: A Little Help from another person toileting, which includes using toliet, bedpan, or urinal?: A Little Help from another person bathing (including washing, rinsing, drying)?: A Little Help from another person to put on and taking off regular upper body clothing?: A Little Help from another person to put on and taking off regular lower body clothing?: A Little 6 Click Score: 16    End of Session Equipment Utilized During Treatment: Rolling walker (2 wheels)  OT Visit Diagnosis: Unsteadiness on feet (R26.81);Muscle weakness (generalized) (M62.81)   Activity Tolerance Patient tolerated treatment well   Patient Left in bed;with call bell/phone within reach;with bed alarm set;with family/visitor present           Time: 8547-8473 OT Time Calculation (min): 34 min  Charges: OT General Charges $OT Visit: 1 Visit OT Treatments $Self Care/Home Management : 8-22 mins $Therapeutic Exercise: 8-22 mins  [name], OTR/L,CBIS  Supplemental OT - [facility] and [facility] Secure Chat Preferred    [name], Felicia BIRCH 09/27/2024, 4:10 PM

## 2024-09-27 NOTE — Progress Notes (Signed)
 Felicia Arthea DASEN, MD  Physician Physical Medicine and Rehabilitation   Consult Note    Signed   Date of Service: 09/26/2024  9:53 AM  Related encounter: ED to Hosp-Admission (Current) from 09/23/2024 in [facility] 4E CV SURGICAL PROGRESSIVE CARE   Signed     Expand All Collapse All           Physical Medicine and Rehabilitation Consult Reason for Consult:Impaired functional mobility Referring Physician: Lue     HPI: [name] is a 58 y.o. female with a history of end-stage renal disease on peritoneal dialysis, recent pituitary hemorrhage/apoplexy, HIV, prior aortic dissection with repair, multiple other medical issues, who presented to the emergency room on 09/23/2024 after dialysis with back, abdominal pain and dyspnea.  Blood pressure upon presentation was 64/30.  Patient received aggressive IV fluids.  CTA of the abdomen did not not demonstrate any significant change in aortic aneurysm/graft, nor any signs of fluid or air in the abdomen.  Patient with elevated troponins.  EKG demonstrated left bundle branch block with PACs.  Patient was started on empiric antibiotics as well as stress dose steroids given concern for adrenal insufficiency.  Echocardiogram on 09/24/2024 demonstrated ejection fraction of 30 to 35% with left ventricular wall motion abnormality and grade 2 diastolic dysfunction with moderate to severe MR.  Patient with persistent back pain and MRI of the lumbar and thoracic spine was unrevealing for any acute findings.  Nephrology follows the patient for end-stage renal disease/peritoneal dialysis.  Cardiology consulted with plan to perform left and right heart catheterizations today.  Patient has been up with therapies and has been min assist for sit to stand transfers and has walked 35 feet min assist using a rolling walker.  Patient lives alone in a two-level home although she can live on the first floor.  There are 2 steps to enter.  Patient was independent  prior to arrival but was using rolling walker more recently and becoming more sedentary overall.       Home: Home Living Family/patient expects to be discharged to:: Private residence Living Arrangements: Alone Available Help at Discharge: Family, Available 24 hours/day Type of Home: Other(Comment) (townhome) Home Access: Stairs to enter [employer] of Steps: 2 Entrance Stairs-Rails: None Home Layout: Two level, 1/2 bath on main level, Bed/bath upstairs, Able to live on main level with bedroom/bathroom Alternate Level Stairs-Number of Steps: 10 Alternate Level Stairs-Rails: Right, Left, Can reach both Bathroom Shower/Tub: Tub/shower unit Bathroom Toilet: Standard Bathroom Accessibility: Yes Home Equipment: Agricultural [employer] (2 wheels)  Functional History: Prior Function Prior Level of Function : Independent/Modified Independent Mobility Comments: recently using RW, spending more and more time upstairs in her room. ADLs Comments: Mod I with ADL's prior to one month ago; for the past month, sister assisting intermittently, predominantly with bathing and bringing meals Functional Status:  Mobility: Bed Mobility Overal bed mobility: Needs Assistance Bed Mobility: Supine to Sit, Sit to Supine Supine to sit: Supervision Sit to supine: Supervision General bed mobility comments: assist for lines Transfers Overall transfer level: Needs assistance Equipment used: Rolling walker (2 wheels) Transfers: Sit to/from Stand Sit to Stand: Min assist, +2 physical assistance General transfer comment: Min A for balance stabilization. 2 person assist for safety with lines/leads. Ambulation/Gait Ambulation/Gait assistance: Min assist, +2 safety/equipment Gait Distance (Feet): 35 Feet (&12', 20') Assistive device: Rolling walker (2 wheels) Gait Pattern/deviations: Step-through pattern, Decreased stride length, Wide base of support, Shuffle General Gait Details: tremors noted and pt  reported feeling shaky, seated rest x 2, though VSS except one episode desaturation, improved with pursed lip breathing on RA Gait velocity: decreased Gait velocity interpretation: <1.31 ft/sec, indicative of household ambulator   ADL: ADL Overall ADL's : Needs assistance/impaired Eating/Feeding: NPO Grooming: Contact guard assist, Standing Upper Body Bathing: Set up, Sitting Lower Body Bathing: Minimal assistance, Sit to/from stand Upper Body Dressing : Set up, Sitting Lower Body Dressing: Minimal assistance, Sit to/from stand Toilet Transfer: Contact guard assist, Rolling walker (2 wheels), Ambulation Functional mobility during ADLs: Contact guard assist, Rolling walker (2 wheels)   Cognition: Cognition Orientation Level: Oriented X4 Cognition Arousal: Alert Behavior During Therapy: Flat affect     Review of Systems  Constitutional:  Positive for malaise/fatigue. Negative for fever.  HENT: Negative.    Eyes: Negative.   Respiratory:  Positive for cough and shortness of breath.   Cardiovascular:  Negative for chest pain.  Gastrointestinal:  Positive for abdominal pain.  Genitourinary:  Positive for flank pain.  Musculoskeletal:  Positive for back pain and myalgias.  Skin: Negative.   Neurological:  Positive for tingling, sensory change and weakness.  Psychiatric/Behavioral:  Negative for hallucinations.        Past Medical History:  Diagnosis Date   Acute pain of right shoulder due to trauma 06/08/2023   Acute pancreatitis 10/23/2021   Acute renal failure superimposed on stage 4 chronic kidney disease (HCC) 10/24/2021   Allergy     Anemia      Normocytic   Antibiotic-induced yeast infection 09/28/2020   Anxiety     Asthma     Bronchitis 2005   Bursitis of left shoulder 09/06/2019   CKD (chronic kidney disease)      Dialysis on Mon-Wed- Fri   CLASS 1-EXOPHTHALMOS-THYROTOXIC 02/08/2007   Cognitive dysfunction 02/21/2020   COVID-19 long hauler 05/11/2021    COVID-19 virus infection 04/28/2022   Diabetes mellitus without complication (HCC)      type 2   Dissecting abdominal aortic aneurysm (HCC) 02/05/2023   DVT (deep venous thrombosis) (HCC) 08/29/2023    post-op DVT left IJ, brachial, axillary, subclavian veins 08/29/23   DVT of upper extremity (deep vein thrombosis) (HCC) 09/03/2023   Dysphagia 07/23/2019   Encephalopathy acute 02/02/2023   Family history of breast cancer     Family history of lung cancer     Family history of prostate cancer     Gastroenteritis 07/10/2007   Genetic testing 07/25/2018    [employer] + RNA Insight was ordered.  Genes Analyzed (43 total): APC*, ATM*, AXIN2, BARD1, BMPR1A, BRCA1*, BRCA2*, BRIP1*, CDH1*, CDK4, CDKN2A, CHEK2*, DICER1, GALNT12, HOXB13, MEN1, MLH1*, MRE11A, MSH2*, MSH3, MSH6*, MUTYH*, NBN, NF1*, NTHL1, PALB2*, PMS2*, POLD1, POLE, PTEN*, RAD50, RAD51C*, RAD51D*, RET, SDHB, SDHD, SMAD4, SMARCA4, STK11 and TP53* (sequencing and deletion/duplication); EGFR (s   GERD 07/24/2006   GRAVE'S DISEASE 01/01/2008   History of hidradenitis suppurativa     History of kidney stones     History of thrush     HIV DISEASE 07/24/2006    dx March 05   Hyperlipidemia     HYPERTENSION 07/24/2006   Hyperthyroidism 08/2006    Grave's Disease -diffuse radiotracer uptake 08/25/06 Thyroid  scan-Cold nodule to R lower lobe of thyrorid   Ileus (HCC) 02/14/2023   Left bundle branch block (LBBB)     Malnutrition of moderate degree 02/08/2023   Menometrorrhagia      hx of   Nephrolithiasis     Panniculitis 05/12/2020   Papillary adenocarcinoma of thyroid  (  HCC)      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 Completed 2 yrs Prednisone  after lung bx confirmation. No symptoms since then.   Sebaceous cyst 04/21/2020   Suppurative hidradenitis     Thyroid  cancer (HCC)     THYROID  NODULE, RIGHT 02/08/2007             Past Surgical History:  Procedure Laterality Date   AORTIC ARCH DEBRANCHING N/A 08/25/2023    Procedure: AORTIC ARCH DEBRANCHING USING 12X8X8MM HEMASHIELD GRAFT;  Surgeon: Lucas Dorise POUR, MD;  Location: [facility] OR;  Service: Open Heart Surgery;  Laterality: N/A;  with bypasses to the innominate and left common carotid arteries   APPLICATION OF WOUND VAC N/A 01/20/2021    Procedure: APPLICATION OF WOUND VAC;  Surgeon: Lowery Estefana RAMAN, DO;  Location: [facility];  Service: Plastics;  Laterality: N/A;   BREAST EXCISIONAL BIOPSY Right 04/26/2018    right axilla negative   BREAST EXCISIONAL BIOPSY Left 04/26/2018    left axilla negative   BREAST LUMPECTOMY Right 10/03/2018    malignant   BREAST LUMPECTOMY WITH RADIOACTIVE SEED AND SENTINEL LYMPH NODE BIOPSY Right 10/03/2018    Procedure: RIGHT BREAST LUMPECTOMY WITH RADIOACTIVE SEED AND SENTINEL LYMPH NODE MAPPING;  Surgeon: Vanderbilt Ned, MD;  Location: [facility] OR;  Service: General;  Laterality: Right;   BREAST SURGERY   1997    Breast Reduction    CAPD INSERTION N/A 12/21/2023    Procedure: LAPAROSCOPIC INSERTION CONTINUOUS AMBULATORY PERITONEAL DIALYSIS  (CAPD) CATHETER; LAPAROSCOPIC OMENTUMPEXY;  Surgeon: Magda Ned SAILOR, MD;  Location: [facility];  Service: Vascular;  Laterality: N/A;   CYSTOSCOPY W/ URETERAL STENT REMOVAL   11/09/2012    Procedure: CYSTOSCOPY WITH STENT REMOVAL;  Surgeon: Ricardo Likens, MD;  Location: [facility];  Service: Urology;  Laterality: Right;   CYSTOSCOPY WITH RETROGRADE PYELOGRAM,  URETEROSCOPY AND STENT PLACEMENT   11/09/2012    Procedure: CYSTOSCOPY WITH RETROGRADE PYELOGRAM, URETEROSCOPY AND STENT PLACEMENT;  Surgeon: Ricardo Likens, MD;  Location: [facility];  Service: Urology;  Laterality: Left;  LEFT URETEROSCOPY, STONE MANIPULATION, left STENT exchange     CYSTOSCOPY WITH STENT PLACEMENT   10/02/2012    Procedure: CYSTOSCOPY WITH STENT PLACEMENT;  Surgeon: Ricardo Likens, MD;  Location: [facility];  Service: Urology;  Laterality: Left;   DEBRIDEMENT AND CLOSURE WOUND N/A 01/20/2021    Procedure: Excision of abdominal wound with closure;  Surgeon: Lowery Estefana RAMAN, DO;  Location: [facility];  Service: Plastics;  Laterality: N/A;   DILATION AND CURETTAGE OF UTERUS   11/2002    s/p for 1st trimester nonviable pregnancy   ESOPHAGOGASTRODUODENOSCOPY N/A 09/11/2024    Procedure: EGD (ESOPHAGOGASTRODUODENOSCOPY);  Surgeon: Abran Norleen SAILOR, MD;  Location: [facility];  Service: Gastroenterology;  Laterality: N/A;   EYE SURGERY        sty under eyelid   INCISE AND DRAIN ABCESS   08/2002  s/p I &D for righ inframmary fold hidradenitis   INCISION AND DRAINAGE PERITONSILLAR ABSCESS   12/2001   IR CV LINE INJECTION   06/07/2018   IR FLUORO GUIDE CV LINE RIGHT   01/30/2023   IR IMAGING GUIDED PORT INSERTION   06/20/2018   IR REMOVAL TUN ACCESS W/ PORT W/O FL MOD SED   06/20/2018   IR US  GUIDE VASC ACCESS RIGHT   01/30/2023   IRRIGATION AND DEBRIDEMENT ABSCESS   01/31/2012    Procedure: IRRIGATION AND DEBRIDEMENT ABSCESS;  Surgeon: Alm VEAR Angle, MD;  Location: [facility];  Service: General;  Laterality: Right;  right breast and axilla    LAPAROSCOPIC REPOSITIONING CAPD CATHETER Left 01/10/2024    Procedure: Removal of peritoneal dialysis Catheter.;  Surgeon: Magda Debby SAILOR, MD;  Location: [facility] OR;  Service: Vascular;  Laterality: Left;   LAPAROSCOPY N/A 01/10/2024    Procedure: LAPAROSCOPY, DIAGNOSTIC;  Surgeon: Magda Debby SAILOR, MD;  Location: [facility] OR;  Service:  Vascular;  Laterality: N/A;   NEPHROLITHOTOMY   10/02/2012    Procedure: NEPHROLITHOTOMY PERCUTANEOUS;  Surgeon: Ricardo Likens, MD;  [facility]: [facility];  Service: Urology;  Laterality: Right;  First Stage Percutaneous Nephrolithotomy with Surgeon Access, Left Ureteral Stent      NEPHROLITHOTOMY   10/04/2012    Procedure: NEPHROLITHOTOMY PERCUTANEOUS SECOND LOOK;  Surgeon: Ricardo Likens, MD;  [facility]: [facility];  Service: Urology;  Laterality: Right;       NEPHROLITHOTOMY   10/08/2012    Procedure: NEPHROLITHOTOMY PERCUTANEOUS;  Surgeon: Ricardo Likens, MD;  Location: [facility];  Service: Urology;  Laterality: Right;  THIRD STAGE, nephrostomy tube exchange x 2   NEPHROLITHOTOMY   10/11/2012    Procedure: NEPHROLITHOTOMY PERCUTANEOUS SECOND LOOK;  Surgeon: Ricardo Likens, MD;  Location: [facility];  Service: Urology;  Laterality: Right;  RIGHT 4 STAGE PERCUTANOUS NEPHROLITHOTOMY, right URETEROSCOPY WITH HOLMIUM LASER    PANNICULECTOMY N/A 12/21/2020    Procedure: PANNICULECTOMY;  Surgeon: Lowery Estefana RAMAN, DO;  [facility]: [facility];  Service: Plastics;  Laterality: N/A;   PERCUTANEOUS NEPHROSTOLITHOTOMY   04/2022   PORT-A-CATH REMOVAL N/A 07/16/2020    Procedure: REMOVAL PORT-A-CATH;  Surgeon: Vanderbilt Debby, MD;  Location: [facility];  Service: General;  Laterality: N/A;   PORTACATH PLACEMENT Left 05/17/2018    Procedure: INSERTION PORT-A-CATH;  Surgeon: Vernetta Berg, MD;  Location: [facility];  Service: General;  Laterality: Left;   RADICAL NECK DISSECTION   04/12/2017    limited/notes 04/12/2017   RADICAL NECK DISSECTION N/A 04/12/2017    Procedure: RADICAL NECK DISSECTION;  Surgeon: Carlie Clark, MD;  Location: [name] OR;  Service: ENT;  Laterality: N/A;  limited neck dissection 2 hours total   REDUCTION MAMMAPLASTY Bilateral 1998   RIGHT/LEFT HEART CATH AND CORONARY ANGIOGRAPHY N/A 03/12/2020    Procedure: RIGHT/LEFT HEART CATH AND CORONARY ANGIOGRAPHY;  Surgeon:  Cherrie Toribio SAUNDERS, MD;  [facility];  Service: Cardiovascular;  Laterality: N/A;   [place]   1994   TEE WITHOUT CARDIOVERSION N/A 08/25/2023    Procedure: TRANSESOPHAGEAL ECHOCARDIOGRAM;  Surgeon: Lucas Dorise POUR, MD;  [facility]: [facility];  Service: Open Heart Surgery;  Laterality: N/A;   TENCKHOFF CATHETER INSERTION Left 01/10/2024    Procedure: INSERTION, CATHETER, DIALYSIS, PERITONEAL;  Surgeon: Magda Debby SAILOR, MD;  [facility]: [facility];  Service: Vascular;  Laterality: Left;   THORACIC AORTIC ENDOVASCULAR STENT GRAFT N/A 02/02/2023    Procedure: THORACIC AORTIC ENDOVASCULAR STENT GRAFT;  Surgeon: Lanis Fonda BRAVO,  MD;  Location: [facility];  Service: Vascular;  Laterality: N/A;   THORACIC AORTIC ENDOVASCULAR STENT GRAFT N/A 08/25/2023    Procedure: THORACIC AORTIC ENDOVASCULAR STENT GRAFT;  Surgeon: Lanis Fonda BRAVO, MD;  Location: [facility] OR;  Service: Vascular;  Laterality: N/A;   THYROIDECTOMY   04/12/2017    completion/notes 04/12/2017   THYROIDECTOMY N/A 04/12/2017    Procedure: THYROIDECTOMY;  Surgeon: Carlie Clark, MD;  Location: [facility] OR;  Service: ENT;  Laterality: N/A;  Completion Thyroidectomy   TOTAL THYROIDECTOMY   2010   ULTRASOUND GUIDANCE FOR VASCULAR ACCESS Right 08/25/2023    Procedure: ULTRASOUND GUIDANCE FOR VASCULAR ACCESS, RIGHT FEMORAL ARTERY;  Surgeon: Lanis Fonda BRAVO, MD;  Location: [facility] OR;  Service: Vascular;  Laterality: Right;             Family History  Problem Relation Age of Onset   Hypertension Mother     Cancer Mother          laryngeal   Heart disease Mother          stent   Pancreatic cancer Father     Hypertension Father     Lung cancer Father 38        hx smoking   Hypertension Sister     Hypertension Sister     Hypertension Brother     Breast cancer Maternal Aunt 45   Breast cancer Maternal Aunt          dx 60+   Cancer Maternal Uncle          Lung CA   Breast cancer Paternal Aunt 33   Breast cancer Paternal Aunt          dx 48's   Breast  cancer Paternal Aunt          dx 50's   Prostate cancer Paternal Uncle     Prostate cancer Paternal Uncle     Lung cancer Paternal Uncle     Breast cancer Cousin 51   Breast cancer Cousin          dx <50   Breast cancer Cousin          dx <50   Breast cancer Cousin          dx <50   Heart disease Other     Hypertension Other     Stroke Other          Grandparent   Kidney disease Other          Grandparent   Diabetes Other          FH of Diabetes   Colon cancer Neg Hx     Esophageal cancer Neg Hx     Rectal cancer Neg Hx     Stomach cancer Neg Hx          Social History:  reports that she quit smoking about 6 years ago. Her smoking use included cigarettes. She started smoking about 7 years ago. She has a 7.5 pack-year smoking history. She has never used smokeless tobacco. She reports that she does not currently use alcohol . She reports that she does not use drugs. Allergies: [Allergies]  [Allergies]      Allergen Reactions   Genvoya [Elviteg-Cobic-Emtricit-Tenofaf] Hives   Lisinopril Cough   Aldactone  [Spironolactone ] Hives   Valium  [Diazepam ] Other (See Comments)      Lethargy (pt states she cannot take this again)   Tegaderm Ag Mesh [Silver] Itching  Medications Prior to Admission  Medication Sig Dispense Refill   acetaminophen  (TYLENOL ) 500 MG tablet Take 1,000 mg by mouth every 6 (six) hours as needed for mild pain.       allopurinol  (ZYLOPRIM ) 100 MG tablet TAKE 1 TABLET (100 MG TOTAL) BY MOUTH DAILY. FOR GOUT PREVENTION 90 tablet 2   aspirin  81 MG chewable tablet Chew 81 mg by mouth daily.       AURYXIA  1 GM 210 MG(Fe) tablet Take 210 mg by mouth 3 (three) times daily with meals.       busPIRone  (BUSPAR ) 5 MG tablet TAKE 1 TABLET BY MOUTH TWICE A DAY FOR ANXIETY 180 tablet 0   dolutegravir  ([name] ) 50 MG tablet Take 1 tablet (50 mg total) by mouth daily. 30 tablet 11   emtricitabine -tenofovir  AF (DESCOVY ) 200-25 MG tablet Take 1 tablet by mouth daily. 30  tablet 11   fluticasone  (FLOVENT  HFA) 110 MCG/ACT inhaler Inhale 1 puff into the lungs 2 (two) times daily.       levocetirizine (XYZAL ) 5 MG tablet TAKE 1 TABLET BY MOUTH EVERY DAY IN THE EVENING FOR ALLERGIES 90 tablet 1   levothyroxine  (SYNTHROID ) 150 MCG tablet TAKE 1 TABLET BY MOUTH EVERY DAY BEFORE BREAKFAST 30 tablet 0   lidocaine  (LIDODERM ) 5 % PLACE 1 PATCH ONTO THE SKIN DAILY. REMOVE & DISCARD PATCH WITHIN 12 HOURS OR AS DIRECTED BY MD (Patient taking differently: Place 1 patch onto the skin daily as needed. Remove & Discard patch within 12 hours or as directed by MD) 30 patch 0   minocycline  (MINOCIN ) 100 MG capsule Take 1 capsule (100 mg total) by mouth 2 (two) times daily. 60 capsule 5   omeprazole  (PRILOSEC) 40 MG capsule Take 1 capsule (40 mg total) by mouth 2 (two) times daily. for heartburn. 90 capsule 2   ondansetron  (ZOFRAN -ODT) 4 MG disintegrating tablet Take 1 tablet (4 mg total) by mouth every 8 (eight) hours as needed for nausea or vomiting. 20 tablet 0   rosuvastatin  (CRESTOR ) 10 MG tablet TAKE 1 TABLET BY MOUTH EVERY DAY FOR CHOLESTEROL 90 tablet 2   sevelamer  carbonate (RENVELA ) 800 MG tablet Take 800 mg by mouth 3 (three) times daily with meals.       tiZANidine  (ZANAFLEX ) 4 MG tablet Take 1-2 tablets (4-8 mg total) by mouth every 8 (eight) hours as needed for muscle spasms. 90 tablet 0   calcitRIOL  (ROCALTROL ) 0.25 MCG capsule Take 0.25 mcg by mouth 3 (three) times a week. (Patient not taking: Reported on 09/23/2024)       Lancets (ONETOUCH ULTRASOFT) lancets Use as instructed to test blood sugar daily 100 each 5            Blood pressure 91/60, pulse 81, temperature 98 F (36.7 C), temperature source Oral, resp. rate 10, weight 83.4 kg, last menstrual period 03/31/2014, SpO2 96%. Physical Exam Constitutional:      General: She is not in acute distress.    Appearance: She is obese. She is ill-appearing.  HENT:     Head: Normocephalic and atraumatic.     Right Ear:  External ear normal.     Left Ear: External ear normal.     Nose: Nose normal.     Mouth/Throat:     Mouth: Mucous membranes are moist.     Pharynx: Oropharynx is clear.  Eyes:     Extraocular Movements: Extraocular movements intact.     Conjunctiva/sclera: Conjunctivae normal.  Cardiovascular:     Rate  and Rhythm: Normal rate and regular rhythm.     Heart sounds: No murmur heard. Pulmonary:     Effort: Pulmonary effort is normal.  Abdominal:     Tenderness: There is abdominal tenderness.     Comments: PD site  Musculoskeletal:        General: Swelling and tenderness present.     Cervical back: Normal range of motion.  Skin:    General: Skin is warm.  Neurological:     Comments: Pt fairly alert. Oriented to person, place, reason. Provided me biographical information. Normal language, quiet voice. CN exam non-focal. MMT: BUE 4- prox to 4/5 distally. BLE 3+ HF, KE and 4/5 ADF/PF. Stocking glove sensory loss in either foot up to ankle and in the bilateral hands up to wrist. DTR's 1+. No abnl resting tone.   Psychiatric:     Comments: Flat but cooperative.       Lab Results Last 24 Hours       Results for orders placed or performed during the hospital encounter of 09/23/24 (from the past 24 hours)  Glucose, capillary     Status: Abnormal    Collection Time: 09/25/24 11:30 AM  Result Value Ref Range    Glucose-Capillary 117 (H) 70 - 99 mg/dL  Glucose, capillary     Status: Abnormal    Collection Time: 09/25/24  3:29 PM  Result Value Ref Range    Glucose-Capillary 222 (H) 70 - 99 mg/dL  Glucose, capillary     Status: Abnormal    Collection Time: 09/25/24  7:34 PM  Result Value Ref Range    Glucose-Capillary 103 (H) 70 - 99 mg/dL  Glucose, capillary     Status: Abnormal    Collection Time: 09/25/24 11:31 PM  Result Value Ref Range    Glucose-Capillary 143 (H) 70 - 99 mg/dL  Glucose, capillary     Status: Abnormal    Collection Time: 09/26/24  3:36 AM  Result Value Ref Range     Glucose-Capillary 206 (H) 70 - 99 mg/dL  CBC     Status: Abnormal    Collection Time: 09/26/24  5:32 AM  Result Value Ref Range    WBC 16.1 (H) 4.0 - 10.5 K/uL    RBC 2.65 (L) 3.87 - 5.11 MIL/uL    Hemoglobin 9.8 (L) 12.0 - 15.0 g/dL    HCT 71.1 (L) 63.9 - 46.0 %    MCV 108.7 (H) 80.0 - 100.0 fL    MCH 37.0 (H) 26.0 - 34.0 pg    MCHC 34.0 30.0 - 36.0 g/dL    RDW 84.7 88.4 - 84.4 %    Platelets 226 150 - 400 K/uL    nRBC 0.7 (H) 0.0 - 0.2 %  Basic metabolic panel with GFR     Status: Abnormal    Collection Time: 09/26/24  5:32 AM  Result Value Ref Range    Sodium 130 (L) 135 - 145 mmol/L    Potassium 3.8 3.5 - 5.1 mmol/L    Chloride 94 (L) 98 - 111 mmol/L    CO2 22 22 - 32 mmol/L    Glucose, Bld 166 (H) 70 - 99 mg/dL    BUN 37 (H) 6 - 20 mg/dL    Creatinine, Ser 89.37 (H) 0.44 - 1.00 mg/dL    Calcium  8.6 (L) 8.9 - 10.3 mg/dL    GFR, Estimated 4 (L) >60 mL/min    Anion gap 14 5 - 15  Magnesium      Status:  None    Collection Time: 09/26/24  5:32 AM  Result Value Ref Range    Magnesium  2.3 1.7 - 2.4 mg/dL  Phosphorus     Status: None    Collection Time: 09/26/24  5:32 AM  Result Value Ref Range    Phosphorus 4.3 2.5 - 4.6 mg/dL  Glucose, capillary     Status: None    Collection Time: 09/26/24  7:55 AM  Result Value Ref Range    Glucose-Capillary 80 70 - 99 mg/dL    *Note: Due to a large number of results and/or encounters for the requested time period, some results have not been displayed. A complete set of results can be found in Results Review.      Imaging Results (Last 48 hours)  US  Abdomen Limited RUQ (LIVER/GB) Result Date: 09/24/2024 EXAM: Right Upper Quadrant Abdominal Ultrasound 09/24/2024 03:50:43 PM TECHNIQUE: Real-time ultrasonography of the right upper quadrant of the abdomen was performed. COMPARISON: None available. CLINICAL HISTORY: Abdominal pain FINDINGS: LIVER: The liver demonstrates normal echogenicity. No intrahepatic biliary ductal dilatation. No  evidence of mass. BILIARY SYSTEM: No pericholecystic fluid. Cholesterolosis of the gallbladder wall, suggesting Adenomyomatosis. Layering 16 mm gallstone. Common bile duct is within normal limits measuring 3 mm. OTHER: No right upper quadrant ascites. IMPRESSION: 1. Cholelithiasis without sonographic findings to suggest acute cholecystitis. 2. Gallbladder adenomyomatosis, benign. Electronically signed by: Pinkie Pebbles MD 09/24/2024 07:22 PM EST RP Workstation: [id number]    VAS US  LOWER EXTREMITY VENOUS (DVT) Result Date: 09/24/2024  Lower Venous DVT Study Patient Name:  SUMIKO CEASAR [facility]  Date of Exam:   09/24/2024 Medical Rec #: [id number]              Accession #:    [id number] Date of Birth: Apr 26, 1966              Patient Gender: F Patient Age:   26 years Exam Location:  [facility] Procedure:      VAS US  LOWER EXTREMITY VENOUS (DVT) Referring Phys: TORIBIO SHARPS --------------------------------------------------------------------------------  Indications: Edema. Other Indications: Anemia, H/O thyroid  cancer. Comparison Study: No prior exam. Performing Technologist: Edilia Elden Appl  Examination Guidelines: A complete evaluation includes B-mode imaging, spectral Doppler, color Doppler, and power Doppler as needed of all accessible portions of each vessel. Bilateral testing is considered an integral part of a complete examination. Limited examinations for reoccurring indications may be performed as noted. The reflux portion of the exam is performed with the patient in reverse Trendelenburg.  +---------+---------------+---------+-----------+----------+--------------+ RIGHT    CompressibilityPhasicitySpontaneityPropertiesThrombus Aging +---------+---------------+---------+-----------+----------+--------------+ CFV      Full           Yes      Yes                                 +---------+---------------+---------+-----------+----------+--------------+ SFJ      Full           Yes       Yes                                 +---------+---------------+---------+-----------+----------+--------------+ FV Prox  Full                                                        +---------+---------------+---------+-----------+----------+--------------+  FV Mid   Full                                                        +---------+---------------+---------+-----------+----------+--------------+ FV DistalFull                                                        +---------+---------------+---------+-----------+----------+--------------+ PFV      Full                                                        +---------+---------------+---------+-----------+----------+--------------+ POP      Full           Yes      Yes                                 +---------+---------------+---------+-----------+----------+--------------+ PTV      Full                                                        +---------+---------------+---------+-----------+----------+--------------+ PERO     Full                                                        +---------+---------------+---------+-----------+----------+--------------+   +---------+---------------+---------+-----------+----------+--------------+ LEFT     CompressibilityPhasicitySpontaneityPropertiesThrombus Aging +---------+---------------+---------+-----------+----------+--------------+ CFV      Full           Yes      Yes                                 +---------+---------------+---------+-----------+----------+--------------+ SFJ      Full           Yes      Yes                                 +---------+---------------+---------+-----------+----------+--------------+ FV Prox  Full                                                        +---------+---------------+---------+-----------+----------+--------------+ FV Mid   Full                                                         +---------+---------------+---------+-----------+----------+--------------+  FV DistalFull                                                        +---------+---------------+---------+-----------+----------+--------------+ PFV      Full                                                        +---------+---------------+---------+-----------+----------+--------------+ POP      Full           Yes      Yes                                 +---------+---------------+---------+-----------+----------+--------------+ PTV      Full                                                        +---------+---------------+---------+-----------+----------+--------------+ PERO     Full                                                        +---------+---------------+---------+-----------+----------+--------------+     Summary: BILATERAL: - No evidence of deep vein thrombosis seen in the lower extremities, bilaterally. -No evidence of popliteal cyst, bilaterally.   *See table(s) above for measurements and observations. Electronically signed by Norman Serve on 09/24/2024 at 3:43:33 PM.    Final     ECHOCARDIOGRAM COMPLETE Result Date: 09/24/2024    ECHOCARDIOGRAM REPORT   Patient Name:   MELISSAANN DIZDAREVIC [facility] Date of Exam: 09/24/2024 Medical Rec #:  [id number]             Height:       65.0 in Accession #:    [id number]            Weight:       176.6 lb Date of Birth:  November 18, 1965             BSA:          1.876 m Patient Age:    58 years              BP:           128/58 mmHg Patient Gender: F                     HR:           68 bpm. Exam Location:  Inpatient Procedure: 2D Echo, Cardiac Doppler, Color Doppler and Intracardiac            Opacification Agent (Both Spectral and Color Flow Doppler were            utilized during procedure). Indications:    Shock  History:        Patient has prior history of Echocardiogram examinations, most  recent 01/30/2023. Risk Factors:Hypertension, Diabetes,  Former                 Smoker and Dyslipidemia.  Sonographer:    Juliene Rucks Referring Phys: [id number] TORIBIO JAYSON SHARPS  Sonographer Comments: Patient is obese. IMPRESSIONS  1. Left ventricular ejection fraction, by estimation, is 30 to 35%. The left ventricle has moderately decreased function. The left ventricle demonstrates regional wall motion abnormalities (see scoring diagram/findings for description). There is mild concentric left ventricular hypertrophy. Left ventricular diastolic parameters are consistent with Grade II diastolic dysfunction (pseudonormalization). Elevated left ventricular end-diastolic pressure.  2. Right ventricular systolic function is normal. The right ventricular size is normal.  3. Left atrial size was severely dilated.  4. The mitral valve is normal in structure. Moderate to severe mitral valve regurgitation. No evidence of mitral stenosis.  5. The aortic valve is normal in structure. Aortic valve regurgitation is not visualized. No aortic stenosis is present.  6. The inferior vena cava is normal in size with greater than 50% respiratory variability, suggesting right atrial pressure of 3 mmHg. FINDINGS  Left Ventricle: Left ventricular ejection fraction, by estimation, is 30 to 35%. The left ventricle has moderately decreased function. The left ventricle demonstrates regional wall motion abnormalities. Definity  contrast agent was given IV to delineate the left ventricular endocardial borders. The left ventricular internal cavity size was normal in size. There is mild concentric left ventricular hypertrophy. Left ventricular diastolic parameters are consistent with Grade II diastolic dysfunction (pseudonormalization). Elevated left ventricular end-diastolic pressure.  LV Wall Scoring: The entire apex is akinetic. The anterior wall, antero-lateral wall, anterior septum, inferior wall, posterior wall, mid inferoseptal segment, and basal inferoseptal segment are hypokinetic. Right Ventricle:  The right ventricular size is normal. No increase in right ventricular wall thickness. Right ventricular systolic function is normal. Left Atrium: Left atrial size was severely dilated. Right Atrium: Right atrial size was normal in size. Pericardium: Trivial pericardial effusion is present. The pericardial effusion is circumferential. Mitral Valve: The mitral valve is normal in structure. Moderate to severe mitral valve regurgitation. No evidence of mitral valve stenosis. Tricuspid Valve: The tricuspid valve is normal in structure. Tricuspid valve regurgitation is not demonstrated. No evidence of tricuspid stenosis. Aortic Valve: The aortic valve is normal in structure. Aortic valve regurgitation is not visualized. No aortic stenosis is present. Pulmonic Valve: The pulmonic valve was normal in structure. Pulmonic valve regurgitation is not visualized. No evidence of pulmonic stenosis. Aorta: The aortic root is normal in size and structure. Venous: The inferior vena cava is normal in size with greater than 50% respiratory variability, suggesting right atrial pressure of 3 mmHg. IAS/Shunts: No atrial level shunt detected by color flow Doppler.  LEFT VENTRICLE PLAX 2D LVIDd:         5.20 cm      Diastology LVIDs:         3.80 cm      LV e' medial:    4.90 cm/s LV PW:         1.10 cm      LV E/e' medial:  22.7 LV IVS:        1.00 cm      LV e' lateral:   7.72 cm/s LVOT diam:     2.10 cm      LV E/e' lateral: 14.4 LV SV:         76 LV SV Index:   40 LVOT Area:     3.46 cm  LV  Volumes (MOD) LV vol d, MOD A2C: 222.0 ml LV vol d, MOD A4C: 268.0 ml LV vol s, MOD A2C: 158.0 ml LV vol s, MOD A4C: 197.0 ml LV SV MOD A2C:     64.0 ml LV SV MOD A4C:     268.0 ml LV SV MOD BP:      68.3 ml RIGHT VENTRICLE             IVC RV Basal diam:  3.60 cm     IVC diam: 1.80 cm RV Mid diam:    2.50 cm RV S prime:     11.00 cm/s TAPSE (M-mode): 1.7 cm LEFT ATRIUM             Index        RIGHT ATRIUM           Index LA diam:        4.10 cm  2.19 cm/m   RA Area:     13.30 cm LA Vol (A2C):   78.1 ml 41.63 ml/m  RA Volume:   29.40 ml  15.67 ml/m LA Vol (A4C):   95.1 ml 50.69 ml/m LA Biplane Vol: 91.2 ml 48.61 ml/m  AORTIC VALVE LVOT Vmax:   107.00 cm/s LVOT Vmean:  69.800 cm/s LVOT VTI:    0.219 m  AORTA Ao Root diam: 2.90 cm MITRAL VALVE                TRICUSPID VALVE MV Area (PHT): 3.77 cm     TR Peak grad:   13.7 mmHg MV Decel Time: 201 msec     TR Vmax:        185.00 cm/s MR Peak grad: 115.8 mmHg MR Vmax:      538.00 cm/s   SHUNTS MV E velocity: 111.00 cm/s  Systemic VTI:  0.22 m MV A velocity: 101.00 cm/s  Systemic Diam: 2.10 cm MV E/A ratio:  1.10 Kardie Tobb DO Electronically signed by Dub Huntsman DO Signature Date/Time: 09/24/2024/1:41:17 PM    Final         Assessment/Plan: Diagnosis: 58 year old female with recent pituitary hemorrhage/apoplexy wth a recent decline in strength and balance likely related to suspected adrenal efficiency, circulatory shock, NSTEMI, and severe MR Does the need for close, 24 hr/day medical supervision in concert with the patient's rehab needs make it unreasonable for this patient to be served in a less intensive setting? Yes Co-Morbidities requiring supervision/potential complications:  -ESRD on peritoneal dialysis -HIV -peripheral neuropathy Due to bladder management, bowel management, safety, skin/wound care, disease management, medication administration, pain management, and patient education, does the patient require 24 hr/day rehab nursing? Yes Does the patient require coordinated care of a physician, rehab nurse, therapy disciplines of PT, oT to address physical and functional deficits in the context of the above medical diagnosis(es)? Yes Addressing deficits in the following areas: balance, endurance, locomotion, strength, transferring, bowel/bladder control, bathing, dressing, feeding, grooming, toileting, and psychosocial support Can the patient actively participate in an intensive therapy  program of at least 3 hrs of therapy per day at least 5 days per week? Yes The potential for patient to make measurable gains while on inpatient rehab is excellent Anticipated functional outcomes upon discharge from inpatient rehab are modified independent  with PT, modified independent with OT, n/a with SLP. Estimated rehab length of stay to reach the above functional goals is: 7-10 days Anticipated discharge destination: Home Overall Rehab/Functional Prognosis: excellent   POST ACUTE RECOMMENDATIONS: This patient's condition is appropriate  for continued rehabilitative care in the following setting: CIR Patient has agreed to participate in recommended program. Yes Note that insurance prior authorization may be required for reimbursement for recommended care.   Comment: Pt was independent without a device prior to recent decline over the last 10+ days prior to this admit. She has local family which can provide intermittent assistance. Home is accessible, and she can stay on the first floor of her townhouse if needed. Rehab Admissions Coordinator to follow up.         I have personally performed a face to face diagnostic evaluation of this patient. Additionally, I have examined the patient's medical record including any pertinent labs and radiographic images.     Thanks,   Arthea ONEIDA Gunther, MD 09/26/2024           Routing History  Date/Time From To Method  09/26/2024 10:39 AM Gunther Arthea ONEIDA, MD Gretta Comer POUR, NP In Basket

## 2024-09-27 NOTE — Progress Notes (Addendum)
 Physical Therapy Treatment Patient Details Name: [name] MRN: [id number] DOB: 08/04/1966 Today's Date: 09/27/2024   History of Present Illness 58 y.o. female presenting 12/8 from dialysis for back pain, abdominal pain, and dyspnea. BP found to be 64/30; admitted to ICU for shock. 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 PMH: ESRD on PD, anemia, anxiety, asthma, T2DM, aortic dissection s/p endovascular repair, GERD, Grave's disease, HTN, HLD, papillary thyroid  cancer s/p thyroidectomy (2018) PVD, falls, sarcoidosis, suppurative hydradenitis    PT Comments  Pt sitting up long sitting in bed on arrival, agreeable to session despite endorsing fatigue. Pt able to come to sitting EOB without assist and transfer sit<>stand with min A to boost and steady on rise. Pt performing x2 gait bouts of increasing distance with RW for support and min A to steady as pt tremulous and knees bouncing however no overt buckling noted. Educated pt on importance of frequent mobilization and time up OOB to maximize functional mobility progression with pt verbalizing understanding. Pt continues to be limited in safe mobility by decreased activity tolerance, impaired balance/postural reactions and global weakness and will benefit from intensive inpatient follow-up therapy, >3 hours/day, will continue to follow acutely.     If plan is discharge home, recommend the following: A little help with walking and/or transfers;Assist for transportation;Help with stairs or ramp for entrance;Assistance with cooking/housework   Can travel by private vehicle        Equipment Recommendations  None recommended by PT    Recommendations for Other Services       Precautions / Restrictions Precautions Precautions: Fall Recall of Precautions/Restrictions: Intact Precaution/Restrictions Comments: PD catheter Restrictions Weight Bearing  Restrictions Per Provider Order: No     Mobility  Bed Mobility Overal bed mobility: Needs Assistance Bed Mobility: Supine to Sit, Sit to Supine     Supine to sit: Supervision Sit to supine: Min assist   General bed mobility comments: pt long sitting in bed on arrival, able to transition to EOB without assist, min A to manage LEs back to bed    Transfers Overall transfer level: Needs assistance Equipment used: Rolling walker (2 wheels) Transfers: Sit to/from Stand Sit to Stand: Min assist           General transfer comment: light min A to steady on rise to stand from EOB at lowest height, x2 during session    Ambulation/Gait Ambulation/Gait assistance: Min assist Gait Distance (Feet): 12 Feet (+ 36') Assistive device: Rolling walker (2 wheels) Gait Pattern/deviations: Step-through pattern, Decreased stride length, Wide base of support, Shuffle Gait velocity: decreased     General Gait Details: tremors noted and pt reported feeling shaky with knees bouncing but not buckling. HR up to 106bpm   Stairs             Wheelchair Mobility     Tilt Bed    Modified Rankin (Stroke Patients Only)       Balance Overall balance assessment: Needs assistance Sitting-balance support: Bilateral upper extremity supported, Feet supported Sitting balance-Leahy Scale: Good     Standing balance support: Bilateral upper extremity supported Standing balance-Leahy Scale: Poor Standing balance comment: UE support on walker                            Communication Communication Communication: No apparent difficulties  Cognition Arousal: Alert Behavior During Therapy: Flat affect   PT -  Cognitive impairments: No apparent impairments                         Following commands: Intact      Cueing Cueing Techniques: Verbal cues  Exercises      General Comments General comments (skin integrity, edema, etc.): SpO2 stable on RA 99-100%, HR up to  106bpm with ambulation, BP 131/88(98)      Pertinent Vitals/Pain Pain Assessment Pain Assessment: No/denies pain    Home Living                          Prior Function            PT Goals (current goals can now be found in the care plan section) Acute Rehab PT Goals Patient Stated Goal: to improve mobility and return home. PT Goal Formulation: With patient Time For Goal Achievement: 10/08/24 Progress towards PT goals: Progressing toward goals    Frequency    Min 3X/week      PT Plan      Co-evaluation              AM-PAC PT 6 Clicks Mobility   Outcome Measure  Help needed turning from your back to your side while in a flat bed without using bedrails?: A Little Help needed moving from lying on your back to sitting on the side of a flat bed without using bedrails?: A Little Help needed moving to and from a bed to a chair (including a wheelchair)?: A Little Help needed standing up from a chair using your arms (e.g., wheelchair or bedside chair)?: A Little Help needed to walk in hospital room?: A Lot (<40') Help needed climbing 3-5 steps with a railing? : Total 6 Click Score: 15    End of Session Equipment Utilized During Treatment: Gait belt Activity Tolerance: Patient limited by fatigue Patient left: in bed;with call bell/phone within reach;with bed alarm set Nurse Communication: Mobility status PT Visit Diagnosis: Unsteadiness on feet (R26.81);Other abnormalities of gait and mobility (R26.89);Muscle weakness (generalized) (M62.81)     Time: 9255-9195 PT Time Calculation (min) (ACUTE ONLY): 20 min  Charges:    $Therapeutic Exercise: 8-22 mins PT General Charges $$ ACUTE PT VISIT: 1 Visit                     Prima Rayner R. PTA Acute Rehabilitation Services Office: [phone]   Therisa CHRISTELLA Boor 09/27/2024, 8:35 AM

## 2024-09-27 NOTE — Patient Instructions (Signed)
 1) Strengthening: Chest Pull - Resisted   Hold Theraband in front of body with hands about shoulder width a part. Pull band a part and back together slowly. Repeat __5__ times. Complete ___2_ set(s) per session.. Repeat ___1_ session(s) per day.  http://orth.exer.us /926   Copyright  VHI. All rights reserved.   2) PNF Strengthening: Resisted   Standing with resistive band around each hand, bring right arm up and away, thumb back. Repeat _5-10___ times per set. Do __1__ sets per session. Do __1__ sessions per day.    3) Resisted External Rotation: in Neutral - Bilateral   Sit or stand, tubing in both hands, elbows at sides, bent to 90, forearms forward. Pinch shoulder blades together and rotate forearms out. Keep elbows at sides. Repeat _5___ times per set. Do __1__ sets per session. Do _1___ sessions per day.  http://orth.exer.us /966   Copyright  VHI. All rights reserved.   ELASTIC BAND TRICEPS EXTENSION - SELF FIXATION  While seated, hold and fixate one end of an elastic band against your chest. Hold the other end with your opposite hand with your elbow bent and arm by your side.   Start by pulling the band downward so that the elbow goes from a bent position to a straightened position as shown. Return to starting position and repeat.  Complete 5 repetitions, 2 set.     Theraband Elbow Flexion  Loop the middle of the band around one foot  With arms straight by side and palms facing forward, hold onto each end of the band Bend arms bringing hands toward shoulders, keeping elbows by your side Return to starting position . Complete 5 repetitions, 1 set.

## 2024-09-28 ENCOUNTER — Inpatient Hospital Stay (HOSPITAL_COMMUNITY)

## 2024-09-28 DIAGNOSIS — E1142 Type 2 diabetes mellitus with diabetic polyneuropathy: Secondary | ICD-10-CM | POA: Diagnosis not present

## 2024-09-28 DIAGNOSIS — Z794 Long term (current) use of insulin: Secondary | ICD-10-CM

## 2024-09-28 DIAGNOSIS — M545 Low back pain, unspecified: Secondary | ICD-10-CM

## 2024-09-28 DIAGNOSIS — G8929 Other chronic pain: Secondary | ICD-10-CM | POA: Diagnosis not present

## 2024-09-28 DIAGNOSIS — I5020 Unspecified systolic (congestive) heart failure: Secondary | ICD-10-CM

## 2024-09-28 DIAGNOSIS — R630 Anorexia: Secondary | ICD-10-CM

## 2024-09-28 DIAGNOSIS — Z992 Dependence on renal dialysis: Secondary | ICD-10-CM | POA: Diagnosis not present

## 2024-09-28 LAB — CBC WITH DIFFERENTIAL/PLATELET
Abs Immature Granulocytes: 0.3 K/uL — ABNORMAL HIGH (ref 0.00–0.07)
Basophils Absolute: 0 K/uL (ref 0.0–0.1)
Basophils Relative: 0 %
Eosinophils Absolute: 0 K/uL (ref 0.0–0.5)
Eosinophils Relative: 0 %
HCT: 25.5 % — ABNORMAL LOW (ref 36.0–46.0)
Hemoglobin: 8.6 g/dL — ABNORMAL LOW (ref 12.0–15.0)
Lymphocytes Relative: 6 %
Lymphs Abs: 0.8 K/uL (ref 0.7–4.0)
MCH: 35.8 pg — ABNORMAL HIGH (ref 26.0–34.0)
MCHC: 33.7 g/dL (ref 30.0–36.0)
MCV: 106.3 fL — ABNORMAL HIGH (ref 80.0–100.0)
Metamyelocytes Relative: 1 %
Monocytes Absolute: 0.3 K/uL (ref 0.1–1.0)
Monocytes Relative: 2 %
Myelocytes: 1 %
Neutro Abs: 11.9 K/uL — ABNORMAL HIGH (ref 1.7–7.7)
Neutrophils Relative %: 90 %
Platelets: 233 K/uL (ref 150–400)
RBC: 2.4 MIL/uL — ABNORMAL LOW (ref 3.87–5.11)
RDW: 15.8 % — ABNORMAL HIGH (ref 11.5–15.5)
WBC: 13.2 K/uL — ABNORMAL HIGH (ref 4.0–10.5)
nRBC: 0.5 % — ABNORMAL HIGH (ref 0.0–0.2)

## 2024-09-28 LAB — COMPREHENSIVE METABOLIC PANEL WITH GFR
ALT: 20 U/L (ref 0–44)
AST: 34 U/L (ref 15–41)
Albumin: 2.7 g/dL — ABNORMAL LOW (ref 3.5–5.0)
Alkaline Phosphatase: 374 U/L — ABNORMAL HIGH (ref 38–126)
Anion gap: 18 — ABNORMAL HIGH (ref 5–15)
BUN: 43 mg/dL — ABNORMAL HIGH (ref 6–20)
CO2: 23 mmol/L (ref 22–32)
Calcium: 8.5 mg/dL — ABNORMAL LOW (ref 8.9–10.3)
Chloride: 94 mmol/L — ABNORMAL LOW (ref 98–111)
Creatinine, Ser: 11.9 mg/dL — ABNORMAL HIGH (ref 0.44–1.00)
GFR, Estimated: 3 mL/min — ABNORMAL LOW (ref >60)
Glucose, Bld: 110 mg/dL — ABNORMAL HIGH (ref 70–99)
Potassium: 3.9 mmol/L (ref 3.5–5.1)
Sodium: 135 mmol/L (ref 135–145)
Total Bilirubin: 1 mg/dL (ref 0.0–1.2)
Total Protein: 6 g/dL — ABNORMAL LOW (ref 6.5–8.1)

## 2024-09-28 LAB — GLUCOSE, CAPILLARY
Glucose-Capillary: 109 mg/dL — ABNORMAL HIGH (ref 70–99)
Glucose-Capillary: 121 mg/dL — ABNORMAL HIGH (ref 70–99)
Glucose-Capillary: 149 mg/dL — ABNORMAL HIGH (ref 70–99)
Glucose-Capillary: 211 mg/dL — ABNORMAL HIGH (ref 70–99)

## 2024-09-28 LAB — CULTURE, BLOOD (ROUTINE X 2)
Culture: NO GROWTH
Culture: NO GROWTH
Special Requests: ADEQUATE

## 2024-09-28 LAB — PHOSPHORUS: Phosphorus: 7.1 mg/dL — ABNORMAL HIGH (ref 2.5–4.6)

## 2024-09-28 LAB — MAGNESIUM: Magnesium: 2.2 mg/dL (ref 1.7–2.4)

## 2024-09-28 MED ORDER — GENTAMICIN SULFATE 0.1 % EX CREA
1.0000 | TOPICAL_CREAM | Freq: Every day | CUTANEOUS | Status: DC
  Administered 2024-09-28 – 2024-10-01 (×4): 1 via TOPICAL
  Filled 2024-09-28: qty 15

## 2024-09-28 MED ORDER — CHLORHEXIDINE GLUCONATE CLOTH 2 % EX PADS
6.0000 | MEDICATED_PAD | Freq: Two times a day (BID) | CUTANEOUS | Status: DC
  Administered 2024-09-28 – 2024-10-01 (×5): 6 via TOPICAL

## 2024-09-28 MED ADMIN — Hydrocortisone Sodium Succinate PF For Inj 100 MG: 50 mg | INTRAVENOUS | NDC 00009001103

## 2024-09-28 NOTE — Evaluation (Signed)
 Occupational Therapy Assessment and Plan  Patient Details  Name: [name] MRN: [id number] Date of Birth: 21-Oct-1965  OT Diagnosis: muscle weakness (generalized) Rehab Potential:   ELOS:   10-14days  Today's Date: 09/28/2024 OT Individual Time: 1045-12;00  OT Indvidual Time Calculated ( mins):75 Mins Hospital Problem: Principal Problem:   Debility   Past Medical History:  Past Medical History:  Diagnosis Date   Acute pain of right shoulder due to trauma 06/08/2023   Acute pancreatitis 10/23/2021   Acute renal failure superimposed on stage 4 chronic kidney disease (HCC) 10/24/2021   Allergy    Anemia    Normocytic   Antibiotic-induced yeast infection 09/28/2020   Anxiety    Asthma    Bronchitis 2005   Bursitis of left shoulder 09/06/2019   CKD (chronic kidney disease)    Dialysis on Mon-Wed- Fri   CLASS 1-EXOPHTHALMOS-THYROTOXIC 02/08/2007   Cognitive dysfunction 02/21/2020   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  Genes Analyzed (43 total): APC*, ATM*, AXIN2, BARD1, BMPR1A, BRCA1*, BRCA2*, BRIP1*, CDH1*, CDK4, CDKN2A, CHEK2*, DICER1, GALNT12, HOXB13, MEN1, MLH1*, MRE11A, MSH2*, MSH3, MSH6*, MUTYH*, NBN, NF1*, NTHL1, PALB2*, PMS2*, POLD1, POLE, PTEN*, RAD50, RAD51C*, RAD51D*, RET, SDHB, SDHD, SMAD4, SMARCA4, STK11 and TP53* (sequencing and deletion/duplication); EGFR (s   GERD  07/24/2006   GRAVE'S DISEASE 01/01/2008   History of hidradenitis suppurativa    History of kidney stones    History of thrush    HIV DISEASE 07/24/2006   dx March 05   Hyperlipidemia    HYPERTENSION 07/24/2006   Hyperthyroidism 08/2006   Grave's Disease -diffuse radiotracer uptake 08/25/06 Thyroid  scan-Cold nodule to R lower lobe of thyrorid   Ileus (HCC) 02/14/2023   Left bundle branch block (LBBB)    Malnutrition of moderate degree 02/08/2023   Menometrorrhagia    hx of   Nephrolithiasis    Panniculitis 05/12/2020   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 Completed 2 yrs Prednisone  after lung bx confirmation. No symptoms since then.   Sebaceous cyst 04/21/2020   Suppurative hidradenitis    Thyroid  cancer (HCC)    THYROID  NODULE, RIGHT 02/08/2007   Past Surgical History:  Past Surgical History:  Procedure Laterality Date   AORTIC ARCH DEBRANCHING N/A 08/25/2023   Procedure: AORTIC ARCH DEBRANCHING USING 12X8X8MM HEMASHIELD GRAFT;  Surgeon:  Lucas Dorise POUR, MD;  Location[facility];  Service: Open Heart Surgery;  Laterality: N/A;  with bypasses to the innominate and left common carotid arteries   APPLICATION OF WOUND VAC N/A 01/20/2021   Procedure: APPLICATION OF WOUND VAC;  Surgeon: Lowery Estefana RAMAN, DO;  Location: [facility];  Service: Plastics;  Laterality: N/A;   BREAST EXCISIONAL BIOPSY  Right 04/26/2018   right axilla negative   BREAST EXCISIONAL BIOPSY Left 04/26/2018   left axilla negative   BREAST LUMPECTOMY Right 10/03/2018   malignant   BREAST LUMPECTOMY WITH RADIOACTIVE SEED AND SENTINEL LYMPH NODE BIOPSY Right 10/03/2018   Procedure: RIGHT BREAST LUMPECTOMY WITH RADIOACTIVE SEED AND SENTINEL LYMPH NODE MAPPING;  Surgeon: Vanderbilt Ned, MD;  Location: [facility] OR;  Service: General;  Laterality: Right;   BREAST SURGERY  1997   Breast Reduction    CAPD INSERTION N/A 12/21/2023   Procedure: LAPAROSCOPIC INSERTION CONTINUOUS AMBULATORY PERITONEAL DIALYSIS  (CAPD) CATHETER; LAPAROSCOPIC OMENTUMPEXY;  Surgeon: Magda Ned SAILOR, MD;  Location: [facility] OR;  Service: Vascular;  Laterality: N/A;   CYSTOSCOPY W/ URETERAL STENT REMOVAL  11/09/2012   Procedure: CYSTOSCOPY WITH STENT REMOVAL;  Surgeon: Ricardo Likens, MD;  Location: [facility]S;  Service: Urology;  Laterality: Right;   CYSTOSCOPY WITH RETROGRADE PYELOGRAM, URETEROSCOPY AND STENT PLACEMENT  11/09/2012   Procedure: CYSTOSCOPY WITH RETROGRADE PYELOGRAM, URETEROSCOPY AND STENT PLACEMENT;  Surgeon: Ricardo Likens, MD;  Location: [facility];  Service: Urology;  Laterality: Left;  LEFT URETEROSCOPY, STONE MANIPULATION, left STENT exchange    CYSTOSCOPY WITH STENT PLACEMENT  10/02/2012   Procedure: CYSTOSCOPY WITH STENT PLACEMENT;  Surgeon: Ricardo Likens, MD;  Location: [facility];  Service: Urology;  Laterality: Left;   DEBRIDEMENT AND CLOSURE WOUND N/A 01/20/2021   Procedure: Excision of abdominal wound with closure;  Surgeon: Lowery Estefana RAMAN, DO;  Location: [facility];  Service: Plastics;  Laterality: N/A;   DILATION AND CURETTAGE OF UTERUS  11/2002   s/p for 1st trimester nonviable pregnancy   ESOPHAGOGASTRODUODENOSCOPY N/A 09/11/2024   Procedure: EGD (ESOPHAGOGASTRODUODENOSCOPY);  Surgeon: Abran Norleen SAILOR, MD;  Location: [facility];  Service: Gastroenterology;  Laterality: N/A;   EYE SURGERY     sty under eyelid    INCISE AND DRAIN ABCESS  08/2002   s/p I &D for righ inframmary fold hidradenitis   INCISION AND DRAINAGE PERITONSILLAR ABSCESS  12/2001   IR CV LINE INJECTION  06/07/2018   IR FLUORO GUIDE CV LINE RIGHT  01/30/2023   IR IMAGING GUIDED PORT INSERTION  06/20/2018   IR REMOVAL TUN ACCESS W/ PORT W/O FL MOD SED  06/20/2018   IR US  GUIDE VASC ACCESS RIGHT  01/30/2023   IRRIGATION AND DEBRIDEMENT ABSCESS  01/31/2012   Procedure: IRRIGATION AND DEBRIDEMENT ABSCESS;  Surgeon: Alm VEAR Angle, MD;  Location: [facility];  Service: General;  Laterality: Right;  right breast and axilla    LAPAROSCOPIC REPOSITIONING CAPD CATHETER Left 01/10/2024   Procedure: Removal of peritoneal dialysis Catheter.;  Surgeon: Magda Ned SAILOR, MD;  Location: [facility] OR;  Service: Vascular;  Laterality: Left;   LAPAROSCOPY N/A 01/10/2024   Procedure: LAPAROSCOPY, DIAGNOSTIC;  Surgeon: Magda Ned SAILOR, MD;  Location: [facility] OR;  Service: Vascular;  Laterality: N/A;   NEPHROLITHOTOMY  10/02/2012   Procedure: NEPHROLITHOTOMY PERCUTANEOUS;  Surgeon: Ricardo Likens, MD;  Location: [facility];  Service: Urology;  Laterality: Right;  First Stage Percutaneous Nephrolithotomy with Surgeon Access, Left Ureteral Stent     NEPHROLITHOTOMY  10/04/2012  Procedure: NEPHROLITHOTOMY PERCUTANEOUS SECOND LOOK;  Surgeon: Ricardo Likens, MD;  Location: [facility];  Service: Urology;  Laterality: Right;      NEPHROLITHOTOMY  10/08/2012   Procedure: NEPHROLITHOTOMY PERCUTANEOUS;  Surgeon: Ricardo Likens, MD;  Location: [facility];  Service: Urology;  Laterality: Right;  THIRD STAGE, nephrostomy tube exchange x 2   NEPHROLITHOTOMY  10/11/2012   Procedure: NEPHROLITHOTOMY PERCUTANEOUS SECOND LOOK;  Surgeon: Ricardo Likens, MD;  Location: [facility];  Service: Urology;  Laterality: Right;  RIGHT 4 STAGE PERCUTANOUS NEPHROLITHOTOMY, right URETEROSCOPY WITH HOLMIUM LASER    PANNICULECTOMY N/A 12/21/2020   Procedure: PANNICULECTOMY;  Surgeon: Lowery Estefana RAMAN, DO;  Location:  [facility] OR;  Service: Plastics;  Laterality: N/A;   PERCUTANEOUS NEPHROSTOLITHOTOMY  04/2022   PORT-A-CATH REMOVAL N/A 07/16/2020   Procedure: REMOVAL PORT-A-CATH;  Surgeon: Vanderbilt Ned, MD;  Location: [facility];  Service: General;  Laterality: N/A;   PORTACATH PLACEMENT Left 05/17/2018   Procedure: INSERTION PORT-A-CATH;  Surgeon: Vernetta Berg, MD;  Location: [facility];  Service: General;  Laterality: Left;   RADICAL NECK DISSECTION  04/12/2017   limited/notes 04/12/2017   RADICAL NECK DISSECTION N/A 04/12/2017   Procedure: RADICAL NECK DISSECTION;  Surgeon: Carlie Clark, MD;  Location: [facility] OR;  Service: ENT;  Laterality: N/A;  limited neck dissection 2 hours total   REDUCTION MAMMAPLASTY Bilateral 1998   RIGHT/LEFT HEART CATH AND CORONARY ANGIOGRAPHY N/A 03/12/2020   Procedure: RIGHT/LEFT HEART CATH AND CORONARY ANGIOGRAPHY;  Surgeon: Cherrie Toribio SAUNDERS, MD;  [facility]: [facility];  Service: Cardiovascular;  Laterality: N/A;   RIGHT/LEFT HEART CATH AND CORONARY ANGIOGRAPHY N/A 09/26/2024   Procedure: RIGHT/LEFT HEART CATH AND CORONARY ANGIOGRAPHY;  Surgeon: Darron Deatrice LABOR, MD;  [facility]: [facility];  Service: Cardiovascular;  Laterality: N/A;   [place]  1994   TEE WITHOUT CARDIOVERSION N/A 08/25/2023   Procedure: TRANSESOPHAGEAL ECHOCARDIOGRAM;  Surgeon: Lucas Dorise POUR, MD;  Location: [facility];  Service: Open Heart Surgery;  Laterality: N/A;   TENCKHOFF CATHETER INSERTION Left 01/10/2024   Procedure: INSERTION, CATHETER, DIALYSIS, PERITONEAL;  Surgeon: Magda Ned SAILOR, MD;  Location: [facility];  Service: Vascular;  Laterality: Left;   THORACIC AORTIC ENDOVASCULAR STENT GRAFT N/A 02/02/2023   Procedure: THORACIC AORTIC ENDOVASCULAR STENT GRAFT;  Surgeon: Lanis Fonda BRAVO, MD;  Location: [facility] OR;  Service: Vascular;  Laterality: N/A;   THORACIC AORTIC ENDOVASCULAR STENT GRAFT N/A 08/25/2023   Procedure: THORACIC AORTIC ENDOVASCULAR STENT GRAFT;   Surgeon: Lanis Fonda BRAVO, MD;  Location: [facility] OR;  Service: Vascular;  Laterality: N/A;   THYROIDECTOMY  04/12/2017   completion/notes 04/12/2017   THYROIDECTOMY N/A 04/12/2017   Procedure: THYROIDECTOMY;  Surgeon: Carlie Clark, MD;  Location: [facility] OR;  Service: ENT;  Laterality: N/A;  Completion Thyroidectomy   TOTAL THYROIDECTOMY  2010   ULTRASOUND GUIDANCE FOR VASCULAR ACCESS Right 08/25/2023   Procedure: ULTRASOUND GUIDANCE FOR VASCULAR ACCESS, RIGHT FEMORAL ARTERY;  Surgeon: Lanis Fonda BRAVO, MD;  Location: [facility] OR;  Service: Vascular;  Laterality: Right;    Assessment & Plan Clinical Impression: [name] is a 58 y.o. female with a history of end-stage renal disease on peritoneal dialysis, recent pituitary hemorrhage/apoplexy, HIV, prior aortic dissection with repair, multiple other medical issues, who presented to the emergency room on 09/23/2024 after dialysis with back, abdominal pain and dyspnea. Blood pressure upon presentation was 64/30. Patient received aggressive IV fluids. CTA of the abdomen did not not demonstrate any significant change in aortic aneurysm/graft, nor  any signs of fluid or air in the abdomen. Patient with elevated troponins. EKG demonstrated left bundle branch block with PACs. Patient was started on empiric antibiotics as well as stress dose steroids given concern for adrenal insufficiency. Echocardiogram on 09/24/2024 demonstrated ejection fraction of 30 to 35% with left ventricular wall motion abnormality and grade 2 diastolic dysfunction with moderate to severe MR. Patient with persistent back pain and MRI of the lumbar and thoracic spine was unrevealing for any acute findings. Nephrology follows the patient for end-stage renal disease/peritoneal dialysis. Cardiology consulted with plan to perform left and right heart catheterizations today. Patient has been up with therapies and has been min assist for sit to stand transfers and has walked 35 feet min assist using a  rolling walker. Patient lives alone in a two-level home although she can live on the first floor. There are 2 steps to enter. Patient was independent prior to arrival but was using rolling walker more recently and becoming more sedentary overall.  Patient transferred to CIR on 09/27/2024 .    Patient currently requires minA with basic self-care skills secondary to muscle weakness.  Prior to hospitalization, patient could complete BADL'S with independent .  Patient will benefit from skilled intervention to decrease level of assist with basic self-care skills prior to discharge home independently.  Anticipate patient will require intermittent supervision and HHS TBD .  OT - End of Session Endurance Deficit: Yes   OT Evaluation Precautions/Restrictions  Precautions Precautions: Fall Precaution/Restrictions Comments: PD catheter at mid lower abdomen, triple lumen central line in R jugular, on ART for HIV(+) Restrictions Weight Bearing Restrictions Per Provider Order: No General Yes   Vital Signs Therapy Vitals Temp: 98.4 F (36.9 C) Temp Source: Oral Pulse Rate: 92 Resp: 16 BP: 138/72 Oxygen  Therapy SpO2: 100 % O2 Device: Room Air Pain Pain Assessment Pain Scale: 0-10 Pain Score: 6  Pain Location: Back Pain Orientation: Right;Anterior Pain Radiating Towards: Hurts to breath in Pain Descriptors / Indicators: Aching;Discomfort Pain Frequency: Intermittent Pain Onset: On-going Pain Intervention(s): Medication (See eMAR) Multiple Pain Sites: No Home Living/Prior Functioning Home Living Family/patient expects to be discharged to:: Private residence Living Arrangements: Alone Available Help at Discharge: Family, Available 24 hours/day Type of Home: Other(Comment) (Townhouse) Home Access: Stairs to enter [employer] of Steps: 3 Entrance Stairs-Rails: None Home Layout: Two level, Bed/bath upstairs Alternate Level Stairs-Number of Steps: 12-14 (pt-related  #) Alternate Level Stairs-Rails: Right, Left, Can reach both Bathroom Shower/Tub: Tub/shower unit Bathroom Toilet: Standard Bathroom Accessibility: Yes Additional Comments: in decline over past 3 months, family has been bringing her food. bc she says that she has lost her appetite and she is not eating, so she has gone from 220lbs to 160lbs (eating once a day), has fridge and microwave in spare room upstairs  Lives With: Alone, Daughter IADL History Homemaking Responsibilities: Yes Meal Prep Responsibility: Primary Laundry Responsibility: Primary Cleaning Responsibility: Primary Bill Paying/Finance Responsibility: Primary Current License: Yes Mode of Transportation:  (2 months ago) Occupation: On disability Type of Occupation: investment banker, corporate Prior Function Level of Independence: Independent with basic ADLs, Independent with gait, Independent with transfers, Requires assistive device for independence  Able to Take Stairs?: Yes (with help recently) Driving: Yes Vocation: On disability Leisure: Hobbies-yes (Comment) (Cleaning) Vision Baseline Vision/History: 1 Wears glasses Ability to See in Adequate Light: 0 Adequate Patient Visual Report: No change from baseline Vision Assessment?: No apparent visual deficits Perception  Perception: Within Functional Limits Praxis Praxis: WFL Cognition Cognition Overall Cognitive  Status: Within Functional Limits for tasks assessed Arousal/Alertness: Awake/alert Memory: Appears intact Awareness: Appears intact Problem Solving: Appears intact Safety/Judgment: Appears intact Sensation Sensation Light Touch: Appears Intact Coordination Gross Motor Movements are Fluid and Coordinated: No Fine Motor Movements are Fluid and Coordinated: No Coordination and Movement Description: movement and balance affected by decreased strength and low tolerance to activity Motor  Motor Motor: Within Functional Limits Motor - Skilled Clinical  Observations: movement and balance mainly affected by decreased strength and low tolerance to activity  Trunk/Postural Assessment  Cervical Assessment Cervical Assessment: Within Functional Limits Thoracic Assessment Thoracic Assessment: Exceptions to [facility] (mild rounded shoulders) Lumbar Assessment Lumbar Assessment: Within Functional Limits Postural Control Postural Control: Deficits on evaluation Righting Reactions: mildly delayed Protective Responses: mildly decreased  Balance Balance Balance Assessed: Yes Static Sitting Balance Static Sitting - Balance Support: No upper extremity supported Static Sitting - Level of Assistance: 6: Modified independent (Device/Increase time) Dynamic Sitting Balance Dynamic Sitting - Balance Support: Left upper extremity supported;Right upper extremity supported;Feet supported;During functional activity Dynamic Sitting - Level of Assistance: Other (comment) (CGA) Reach (Patient is able to reach ___ inches to right, left, forward, back): requires CGA for far reach; supervision for reaching inside of BOS Static Standing Balance Static Standing - Balance Support: No upper extremity supported Static Standing - Level of Assistance: 6: Modified independent (Device/Increase time);5: Stand by assistance Dynamic Standing Balance Dynamic Standing - Balance Support: Right upper extremity supported;Left upper extremity supported Dynamic Standing - Level of Assistance: Other (comment);4: Min assist (CGA) Extremity/Trunk Assessment  Fair    Care Tool Care Tool Self Care Eating   Eating Assist Level: Set up assist    Oral Care    Oral Care Assist Level: Set up assist    Bathing   Body parts bathed by patient: Right arm;Left arm;Chest;Abdomen;Front perineal area;Buttocks;Right upper leg;Left upper leg;Face Body parts bathed by helper: Left lower leg;Right lower leg   Assist Level: Minimal Assistance - Patient > 75%    Upper Body Dressing(including  orthotics)   What is the patient wearing?: Pull over shirt (simulated using theraband)   Assist Level: Supervision/Verbal cueing    Lower Body Dressing (excluding footwear)   What is the patient wearing?: Pants (simulated using theraband) Assist for lower body dressing: Minimal Assistance - Patient > 75%    Putting on/Taking off footwear   What is the patient wearing?: Socks Assist for footwear: Minimal Assistance - Patient > 75%       Care Tool Toileting Toileting activity   Assist for toileting: Minimal Assistance - Patient > 75%     Care Tool Bed Mobility Roll left and right activity   Roll left and right assist level: Supervision/Verbal cueing    Sit to lying activity   Sit to lying assist level: Minimal Assistance - Patient > 75%    Lying to sitting on side of bed activity         Care Tool Transfers Sit to stand transfer   Sit to stand assist level: Minimal Assistance - Patient > 75%    Chair/bed transfer   Chair/bed transfer assist level: Minimal Assistance - Patient > 75%     Toilet transfer   Assist Level: Contact Guard/Touching assist     Care Tool Cognition  Expression of Ideas and Wants Expression of Ideas and Wants: 4. Without difficulty (complex and basic) - expresses complex messages without difficulty and with speech that is clear and easy to understand  Understanding Verbal and Non-Verbal Content  Understanding Verbal and Non-Verbal Content: 4. Understands (complex and basic) - clear comprehension without cues or repetitions   Memory/Recall Ability Memory/Recall Ability : Current season;That he or she is in a hospital/hospital unit   Refer to Care Plan for Long Term Goals  SHORT TERM GOAL WEEK 1 OT Short Term Goal 1 (Week 1): The pt will safely complete LB bathing and dressing with Min/ ModI using AE as needed. OT Short Term Goal 2 (Week 1): The pt will safely complete toileting with Min/ ModI using AE as needed. OT Short Term Goal 3 (Week 1): The  pt will complete safely complete tub/shower transfers using AE with Min/ ModI after demonstration and initial cues. OT Short Term Goal 4 (Week 1): The pt will safely complete  OT therapy 85% of the time with not more than 3 rest breaks. OT Short Term Goal 5 (Week 1): The pt will safely ambulate for function using a  RW with closeS and additional time.  Recommendations for other services: None    Skilled Therapeutic Intervention  Patient seen this date for an Occupational Therapy Evaluation. The pt presented in good spirits with a willingness to participation. The patient expressed a need to return to her natural environment at a ModI LOF with family support available The pt currently presents as MinA with BADL related task in bathing /dressing for UB/LB secondary to challenges with fatigue and balance. The patient current uses a w/c or a RW for functional mobility secondary to the same precipitants and is believe to benefit from Occupational Therapy intervention to improve her functional outcome in the above mentioned areas of  function to ModI for a safe return to home, based on the dimension of her dwelling. The patient receives dialysis and would benefit from UB exercises to improve her activity tolerance and core strength for safe task performance. Based on the pt's current functional status, chart review, and collaboration with other disciplines, the patient is recommended for  10-14 day of Occupational Therapy to maximize her rehab potential with modification in the POC as needed. Equipment for transitioning to home  needs TBD prior to d/c.   ADL ADL Eating: Set up Where Assessed-Eating: Edge of bed;Bed level Grooming: Setup Where Assessed-Grooming: Sitting at sink Upper Body Bathing: Setup Where Assessed-Upper Body Bathing: Sitting at sink Where Assessed-Lower Body Bathing: Sitting at sink Upper Body Dressing: Setup Where Assessed-Upper Body Dressing: Sitting at sink Lower Body Dressing:  Minimal assistance Where Assessed-Lower Body Dressing: Sitting at sink Toileting: Setup;Minimal assistance Where Assessed-Toileting: Teacher, Adult Education: Curator Method: Surveyor, Minerals: Raised toilet seat Tub/Shower Transfer: Minimal Radiation Protection Practitioner Method: Risk Manager: Insurance Underwriter Method: Warden/ranger: Shower seat with back Mobility  Bed Mobility Bed Mobility: Rolling Right;Rolling Left;Supine to Sit;Sit to Supine Rolling Right: Supervision/verbal cueing Rolling Left: Supervision/Verbal cueing Supine to Sit: Minimal Assistance - Patient > 75% Sit to Supine: Minimal Assistance - Patient > 75% Transfers Sit to Stand: Minimal Assistance - Patient > 75%;Contact Guard/Touching assist Stand to Sit: Minimal Assistance - Patient > 75%;Contact Guard/Touching assist   Discharge Criteria: Patient will be discharged from OT if patient refuses treatment 3 consecutive times without medical reason, if treatment goals not met, if there is a change in medical status, if patient makes no progress towards goals or if patient is discharged from hospital.  The above assessment, treatment plan, treatment alternatives and goals were discussed and mutually agreed  upon: by patient  [name] 09/28/2024, 5:03 PM

## 2024-09-28 NOTE — Progress Notes (Signed)
 Patient given PRN medication for nausea. Reporting smell of lunch also causing her to become nauseous. Does endorse upper right abdominal pain, pain under right breast area, and right upper flank pain. Given PRN pain medication. Vitals obtained.

## 2024-09-28 NOTE — Plan of Care (Signed)
°  Problem: RH Balance Goal: LTG Patient will maintain dynamic standing balance (PT) Description: LTG:  Patient will maintain dynamic standing balance with assistance during mobility activities (PT) Flowsheets (Taken 09/28/2024 1656) LTG: Pt will maintain dynamic standing balance during mobility activities with:: Independent with assistive device    Problem: Sit to Stand Goal: LTG:  Patient will perform sit to stand with assistance level (PT) Description: LTG:  Patient will perform sit to stand with assistance level (PT) Flowsheets (Taken 09/28/2024 1656) LTG: PT will perform sit to stand in preparation for functional mobility with assistance level: Independent with assistive device   Problem: RH Bed Mobility Goal: LTG Patient will perform bed mobility with assist (PT) Description: LTG: Patient will perform bed mobility with assistance, with/without cues (PT). Flowsheets (Taken 09/28/2024 1656) LTG: Pt will perform bed mobility with assistance level of: Independent with assistive device    Problem: RH Bed to Chair Transfers Goal: LTG Patient will perform bed/chair transfers w/assist (PT) Description: LTG: Patient will perform bed to chair transfers with assistance (PT). Flowsheets (Taken 09/28/2024 1656) LTG: Pt will perform Bed to Chair Transfers with assistance level: Independent with assistive device    Problem: RH Car Transfers Goal: LTG Patient will perform car transfers with assist (PT) Description: LTG: Patient will perform car transfers with assistance (PT). Flowsheets (Taken 09/28/2024 1656) LTG: Pt will perform car transfers with assist:: Supervision/Verbal cueing   Problem: RH Furniture Transfers Goal: LTG Patient will perform furniture transfers w/assist (OT/PT) Description: LTG: Patient will perform furniture transfers  with assistance (OT/PT). Flowsheets (Taken 09/28/2024 1656) LTG: Pt will perform furniture transfers with assist:: Independent with assistive device     Problem: RH Ambulation Goal: LTG Patient will ambulate in controlled environment (PT) Description: LTG: Patient will ambulate in a controlled environment, # of feet with assistance (PT). Flowsheets (Taken 09/28/2024 1656) LTG: Pt will ambulate in controlled environ  assist needed:: Supervision/Verbal cueing LTG: Ambulation distance in controlled environment: at least 150 ft using LRAD Goal: LTG Patient will ambulate in home environment (PT) Description: LTG: Patient will ambulate in home environment, # of feet with assistance (PT). Flowsheets (Taken 09/28/2024 1656) LTG: Pt will ambulate in home environ  assist needed:: Independent with assistive device LTG: Ambulation distance in home environment: up to 50 ft using LRAD to maneuver all obstacles and corners   Problem: RH Stairs Goal: LTG Patient will ambulate up and down stairs w/assist (PT) Description: LTG: Patient will ambulate up and down # of stairs with assistance (PT) Flowsheets (Taken 09/28/2024 1656) LTG: Pt will ambulate up/down stairs assist needed:: Supervision/Verbal cueing LTG: Pt will  ambulate up and down number of stairs: at least 3 steps with no HR as per home environment

## 2024-09-28 NOTE — Progress Notes (Signed)
 Patient in bathroom and daughter assisted patient out of bathroom. Per patient and family, patient tapped right side of head on door frame walking out. Patient denies headache. No brusing or injury noted. Vitals obtained. Education reinforced on safety precautions and need for staff to be present during all transfers. [address] PA notified. New order received.

## 2024-09-28 NOTE — Progress Notes (Signed)
 Occupational Therapy Session Note  Patient Details  Name: [name] MRN: [id number] Date of Birth: 07-05-1966  Today's Date: 09/28/2024 OT Individual Time: 9854-9754 OT Individual Time Calculation (min): 60 min    Short Term Goals: Week 1:  OT Short Term Goal 1 (Week 1): The pt will safely complete LB bathing and dressing with Min/ ModI using AE as needed. OT Short Term Goal 2 (Week 1): The pt will safely complete toileting with Min/ ModI using AE as needed. OT Short Term Goal 3 (Week 1): The pt will complete safely complete tub/shower transfers using AE with Min/ ModI after demonstration and initial cues. OT Short Term Goal 4 (Week 1): The pt will safely complete  OT therapy 85% of the time with not more than 3 rest breaks. OT Short Term Goal 5 (Week 1): The pt will safely ambulate for function using a  RW with closeS and additional time.  Skilled Therapeutic Interventions/Progress Updates:  Initial Encounter: The pt was resting in bed at the time of arrival, the pt went on to say that she was very tired, but she would try  UB Exercises: the pt was able to come from supine in bed to EOB with close S, she was able to complete  UB exercises using a 2lb dowel for shld flexion, horizontal abduction, shld rotation, and lg circles with rest breaks as needed. The pt went on to work on UB exercises using a 3lb dumb bell 1 set of 10 for bicep curls, shld flexion, and lifts with 1 rest breaks as needed, the pt required 3 rest breaks . The pt went on to complete 4 sit to stand for a count of 10 using the RW for additional balance at close S. At the end of the session, the pt was able to transfer from EOB to supine in bed with closeS with additional time. The call light and bedside table were both within reach with all additional needs addressed.    Balance/vestibular training;Self Care/advanced ADL retraining;Therapeutic Exercise;DME/adaptive equipment instruction;UE/LE Strength  taining/ROM;Patient/family education;Functional mobility training;Therapeutic Activities   Therapy Documentation Precautions:  Precautions Precautions: Fall Recall of Precautions/Restrictions: Intact Precaution/Restrictions Comments: PD catheter at mid lower abdomen, triple lumen central line in R jugular, on ART for HIV(+) Restrictions Weight Bearing Restrictions Per Provider Order: No General: General Chart Reviewed: Yes Family/Caregiver Present: No Vital Signs: Therapy Vitals Temp: 98.4 F (36.9 C) Temp Source: Oral Pulse Rate: 92 Resp: 16 BP: 138/72 Patient Position (if appropriate): Lying Oxygen  Therapy SpO2: 100 % O2 Device: Room Air Patient Activity (if Appropriate): In bed Pulse Oximetry Type: Intermittent Pain: Pain Assessment Pain Scale: 0-10 Pain Score: 6  Pain Type: Acute pain Pain Location: Back Pain Orientation: Right;Anterior Pain Radiating Towards: Hurts to breath in Pain Descriptors / Indicators: Aching;Discomfort Pain Frequency: Intermittent Pain Onset: On-going Pain Intervention(s): Medication (See eMAR) Multiple Pain Sites: No ADL: ADL Equipment Provided:  (RW, wc) Eating: Set up Where Assessed-Eating: Edge of bed, Bed level Grooming: Setup Where Assessed-Grooming: Sitting at sink Upper Body Bathing: Setup Where Assessed-Upper Body Bathing: Sitting at sink Lower Body Bathing: Setup, Minimal assistance Where Assessed-Lower Body Bathing: Sitting at sink Upper Body Dressing: Setup Where Assessed-Upper Body Dressing: Sitting at sink Lower Body Dressing: Minimal assistance Where Assessed-Lower Body Dressing: Sitting at sink Toileting: Setup, Minimal assistance Where Assessed-Toileting: Teacher, Adult Education: Curator Method: Surveyor, Minerals: Raised toilet seat Tub/Shower Transfer: Minimal assistance Tub/Shower Transfer Method: Therapist, Occupational  with back Lexicographer: Minimal assistance Film/video Editor Method: Warden/ranger: Shower seat with back Vision Baseline Vision/History: 1 Wears glasses Patient Visual Report: No change from baseline Vision Assessment?: No apparent visual deficits Perception  Perception: Within Functional Limits Praxis Praxis: WFL Balance Balance Balance Assessed: Yes Static Sitting Balance Static Sitting - Balance Support: No upper extremity supported Static Sitting - Level of Assistance: 6: Modified independent (Device/Increase time) Dynamic Sitting Balance Dynamic Sitting - Balance Support: Left upper extremity supported;Right upper extremity supported;Feet supported;During functional activity Dynamic Sitting - Level of Assistance: Other (comment) (CGA) Reach (Patient is able to reach ___ inches to right, left, forward, back): requires CGA for far reach; supervision for reaching inside of BOS Static Standing Balance Static Standing - Balance Support: No upper extremity supported Static Standing - Level of Assistance: 6: Modified independent (Device/Increase time);5: Stand by assistance Dynamic Standing Balance Dynamic Standing - Balance Support: Right upper extremity supported;Left upper extremity supported Dynamic Standing - Level of Assistance: Other (comment);4: Min assist (CGA) Exercises:   Other Treatments:     Therapy/Group: Individual Therapy  [name] 09/28/2024, 3:52 PM

## 2024-09-28 NOTE — Progress Notes (Signed)
°   09/28/24 1819  Peritoneal Catheter Mid lower abdomen  Placement Date/Time: 01/10/24 1213   Serial / Lot #: [id number]  Expiration Date: 04/15/28  Procedural Verification: Medical records & consent reviewed;Relevant studies,results and images reviewed;Site marked with initials  Time out: Correct Patient;Corre...  Site Assessment Clean, Dry, Intact  Drainage Description None  Catheter status Accessed;Draining;Patent  Dressing Gauze/Drain sponge  Dressing Status Clean, Dry, Intact  Dressing Intervention New dressing/dressing changed  Cycler Setup  Total Number of Night Cycles 5  Night Fill Volume 2250  Dianeal  Solution Dextrose  1.5% in 6000 mL Low Cal/Low Mag  Dianeal  Additive  (None)  Night Dwell Time per Cycle - Hour(s) 1  Night Dwell Time per Cycle - Minute(s) 30  Night Time Therapy - Minute(s) 12  Night Time Therapy - Hour(s) 10  Minimum Initial Drain Volume 0  Maximum Peritoneal Volume 3375  Night/Total Therapy Volume 88749  Day Exchange No  Completion  Treatment Status Started  Procedure Comments  Tolerated treatment well? Yes  Peritoneal Dialysis Comments Patient voice complaints of not having a urge to eat. Bedside Nurse giving instructions and supplies if patient need to come off if thers is a emergency  Education / Care Plan  Dialysis Education Provided Yes  Hand-off documentation  Hand-off Given Given to shift RN/LPN  Report given to (Full Name) Vicenta Baller LPN  Hand-off Received Received from shift RN/LPN  Report received from (Full Name) Zebedee Mace RN   PD tx initation note:   Pre TX VS: 98.3, 92, 140/62, (83). 18, Spo2 100 R/A  Pre TX weight: 77.1 kg  PD treatment initiated via aseptic technique. Consent signed and in chart. Patient is alert and oriented. No complaints of pain. No specimen collected. PD exit site clean, dry and intact. Gentamycin and new dressing applied. Bedside LPN educated on PD machine and how to contact tech support when PD machine  alarms.

## 2024-09-28 NOTE — Progress Notes (Signed)
 PD treatment completed, patient tolerated treatment well   09/28/24 0530  Peritoneal Catheter Mid lower abdomen  Placement Date/Time: 01/10/24 1213   Serial / Lot #: [id number]  Expiration Date: 04/15/28  Procedural Verification: Medical records & consent reviewed;Relevant studies,results and images reviewed;Site marked with initials  Time out: Correct Patient;Corre...  Site Assessment Clean, Dry, Intact  Drainage Description None  Catheter status Deaccessed  Dressing Gauze/Drain sponge  Dressing Status Clean, Dry, Intact  Dressing Intervention Assessed, no intervention needed  Completion  Treatment Status Complete  Initial Drain Volume 56  Average Dwell Time-Hour(s) 1  Average Dwell Time-Min(s) 30  Average Drain Time 33  Total Therapy Volume 88749  Total Therapy Time-Hour(s) 11  Total Therapy Time-Min(s) 17  Weight after Drain  (per floor)  Effluent Appearance Clear;Yellow  Fluid Balance - CCPD  Total UF (- value on cycler, pt gain) 59 mL  Education / Care Plan  Dialysis Education Provided Yes  Hand-off documentation  Hand-off Given Given to shift RN/LPN  Report given to (Full Name) Mallory Marks, RN

## 2024-09-28 NOTE — Progress Notes (Signed)
 [facility] Progress Note   Subjective:   Now in CIR. Working with PT, denies SOB, CP, dizziness.   Objective Vitals:   09/27/24 2011 09/28/24 0524 09/28/24 0635 09/28/24 0806  BP:  130/73  115/75  Pulse:  89  90  Resp:  16    Temp:  98.5 F (36.9 C)    TempSrc:  Oral    SpO2: 99% 100%    Weight:   77.9 kg   Height:       Physical Exam General: alert female in NAD Lungs: CTA bilaterally, respirations unlabored Abdomen: abdomen soft, non-distended, non-tender Extremities: No edema b/l lower extremties Dialysis Access: PD cath in lower abdomen  Additional Objective Labs: Basic Metabolic Panel: Recent Labs  Lab 09/25/24 0537 09/26/24 0532 09/26/24 1454 09/26/24 1458 09/27/24 0356  NA 130* 130* 131* 131* 131*  K 3.4* 3.8 4.2 4.2 3.9  CL 93* 94*  --   --  94*  CO2 20* 22  --   --  20*  GLUCOSE 117* 166*  --   --  143*  BUN 33* 37*  --   --  38*  CREATININE 11.08* 10.62*  --   --  11.00*  CALCIUM  8.4* 8.6*  --   --  8.3*  PHOS 3.7 4.3  --   --  5.7*   Liver Function Tests: Recent Labs  Lab 09/23/24 1431  AST 19  ALT 9  ALKPHOS 244*  BILITOT 1.0  PROT 6.6  ALBUMIN  1.9*   Recent Labs  Lab 09/23/24 1839  LIPASE 30  AMYLASE 48   CBC: Recent Labs  Lab 09/23/24 1431 09/24/24 0354 09/25/24 0537 09/26/24 0532 09/26/24 1454 09/26/24 1458 09/27/24 0356  WBC 7.8 10.8* 11.7* 16.1*  --   --  12.9*  NEUTROABS 5.1  --   --   --   --   --   --   HGB 11.8* 9.2* 9.1* 9.8* 9.9* 10.2* 9.4*  HCT 36.9 28.0* 27.5* 28.8* 29.0* 30.0* 27.8*  MCV 111.1* 111.1* 108.7* 108.7*  --   --  108.2*  PLT 214 186 180 226  --   --  247   Blood Culture    Component Value Date/Time   SDES PERITONEAL DIALYSATE 09/24/2024 0700   SPECREQUEST Immunocompromised ABDOMINAL 09/24/2024 0700   CULT  09/24/2024 0700    NO GROWTH 3 DAYS Performed at [facility], [address] [place], [place] [address]    REPTSTATUS 09/27/2024 FINAL 09/24/2024 0700     Cardiac Enzymes: No results for input(s): CKTOTAL, CKMB, CKMBINDEX, TROPONINI in the last 168 hours. CBG: Recent Labs  Lab 09/27/24 0849 09/27/24 1256 09/27/24 1624 09/27/24 2032 09/28/24 0524  GLUCAP 85 83 109* 135* 109*   Iron  Studies: No results for input(s): IRON , TIBC, TRANSFERRIN, FERRITIN in the last 72 hours. @lablastinr3 @ Studies/Results: CARDIAC CATHETERIZATION Result Date: 09/26/2024 1.  Tortuous but normal coronary arteries. 2.  Left ventricular angiography was not performed.  EF was severely reduced by echo. 3.  Right heart catheterization showed normal right atrial pressure, mild pulmonary hypertension, mildly elevated wedge pressure and normal cardiac output RA: 5 mmHg RV: 37/8 mmHg PW: 18 mmHg PA: 37/18 with a mean of 23 mmHg Cardiac output/index: 5.59/2.94. Recommendations: Medical therapy for nonischemic cardiomyopathy.   Medications:  dialysis solution 1.5% low-MG/low-CA      allopurinol   100 mg Oral Daily   aspirin   81 mg Oral Daily   budesonide  (PULMICORT ) nebulizer solution  0.25 mg  Nebulization BID   [START ON 09/30/2024] calcitRIOL   0.25 mcg Oral Once per day on Monday Wednesday Friday   Chlorhexidine  Gluconate Cloth  6 each Topical Daily   dolutegravir   50 mg Oral Daily   emtricitabine -tenofovir  AF  1 tablet Oral Daily   feeding supplement  237 mL Oral BID BM   fludrocortisone   0.1 mg Oral Daily   gentamicin  cream  1 Application Topical Daily   heparin  injection (subcutaneous)  5,000 Units Subcutaneous Q8H   hydrocortisone  sod succinate (SOLU-CORTEF ) inj  50 mg Intravenous Q12H   insulin  aspart  0-5 Units Subcutaneous QHS   insulin  aspart  0-6 Units Subcutaneous TID WC   levothyroxine   150 mcg Oral Q0600   lidocaine   1 patch Transdermal Q24H   loratadine   10 mg Oral QHS   metoprolol  succinate  12.5 mg Oral Daily   mirtazapine   7.5 mg Oral QHS   multivitamin  1 tablet Oral QHS   pantoprazole   40 mg Oral Daily   rosuvastatin   10  mg Oral Daily    Dialysis Orders: CCPD 7x week-[facility] 5 exchanges, fill vol 2250, 1.5hr dwell time, edw 74.5kg  Usually does 1/2 1.5% bags and 1/2 2.5% bags    CXR 12/08: no active disease    Assessment/Plan: Hypotension: 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 BP's better.  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 Cardiology consulting, w/ sig WMA's they are planning RHC/ LHC. Appreciate assitance.  Euvolemic on exam today, managing volume with dialysis.  ESRD: on CCPD. Cont PD nightly while here.  Volume: no signs of vol overload, admit CXR was negative. Use all 1.5% w/ PD tonight.  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 Follow, transfuse prn.  Tremors: discontinued gabapentin  and this is a cause of tremors/ asterixis in ESRD pts.   Lucie Collet, PA-C 09/28/2024, 8:29 AM  [facility] Pager: [phone]

## 2024-09-28 NOTE — Evaluation (Signed)
 Physical Therapy Assessment and Plan  Patient Details  Name: [name] MRN: [id number] Date of Birth: Oct 09, 1966  PT Diagnosis: Difficulty walking and Muscle weakness Rehab Potential: Good ELOS: 10-14 days   Today's Date: 09/28/2024 PT Individual Time: 9195-9082 PT Individual Time Calculation (min): 73 min    Hospital Problem: Principal Problem:   Debility   Past Medical History:  Past Medical History:  Diagnosis Date   Acute pain of right shoulder due to trauma 06/08/2023   Acute pancreatitis 10/23/2021   Acute renal failure superimposed on stage 4 chronic kidney disease (HCC) 10/24/2021   Allergy    Anemia    Normocytic   Antibiotic-induced yeast infection 09/28/2020   Anxiety    Asthma    Bronchitis 2005   Bursitis of left shoulder 09/06/2019   CKD (chronic kidney disease)    Dialysis on Mon-Wed- Fri   CLASS 1-EXOPHTHALMOS-THYROTOXIC 02/08/2007   Cognitive dysfunction 02/21/2020   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  Genes Analyzed (43 total): APC*, ATM*, AXIN2, BARD1, BMPR1A, BRCA1*, BRCA2*, BRIP1*, CDH1*, CDK4, CDKN2A, CHEK2*, DICER1, GALNT12, HOXB13, MEN1, MLH1*, MRE11A, MSH2*, MSH3, MSH6*, MUTYH*, NBN, NF1*, NTHL1, PALB2*, PMS2*, POLD1, POLE, PTEN*, RAD50, RAD51C*, RAD51D*, RET, SDHB, SDHD, SMAD4, SMARCA4, STK11 and TP53* (sequencing and deletion/duplication); EGFR (s    GERD 07/24/2006   GRAVE'S DISEASE 01/01/2008   History of hidradenitis suppurativa    History of kidney stones    History of thrush    HIV DISEASE 07/24/2006   dx March 05   Hyperlipidemia    HYPERTENSION 07/24/2006   Hyperthyroidism 08/2006   Grave's Disease -diffuse radiotracer uptake 08/25/06 Thyroid  scan-Cold nodule to R lower lobe of thyrorid   Ileus (HCC) 02/14/2023   Left bundle branch block (LBBB)    Malnutrition of moderate degree 02/08/2023   Menometrorrhagia    hx of   Nephrolithiasis    Panniculitis 05/12/2020   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 Completed 2 yrs Prednisone  after lung bx confirmation. No symptoms since then.   Sebaceous cyst 04/21/2020   Suppurative hidradenitis    Thyroid  cancer (HCC)    THYROID  NODULE, RIGHT 02/08/2007   Past Surgical History:  Past Surgical History:  Procedure Laterality Date   AORTIC ARCH DEBRANCHING N/A 08/25/2023   Procedure: AORTIC ARCH DEBRANCHING USING 12X8X8MM HEMASHIELD  GRAFT;  Surgeon: Lucas Dorise POUR, MD;  Location: [facility] OR;  Service: Open Heart Surgery;  Laterality: N/A;  with bypasses to the innominate and left common carotid arteries   APPLICATION OF WOUND VAC N/A 01/20/2021   Procedure: APPLICATION OF WOUND VAC;  Surgeon: Lowery Estefana RAMAN, DO;  Location: [facility];  Service: Plastics;  Laterality: N/A;   BREAST EXCISIONAL  BIOPSY Right 04/26/2018   right axilla negative   BREAST EXCISIONAL BIOPSY Left 04/26/2018   left axilla negative   BREAST LUMPECTOMY Right 10/03/2018   malignant   BREAST LUMPECTOMY WITH RADIOACTIVE SEED AND SENTINEL LYMPH NODE BIOPSY Right 10/03/2018   Procedure: RIGHT BREAST LUMPECTOMY WITH RADIOACTIVE SEED AND SENTINEL LYMPH NODE MAPPING;  Surgeon: Vanderbilt Ned, MD;  Location: [facility] OR;  Service: General;  Laterality: Right;   BREAST SURGERY  1997   Breast Reduction    CAPD INSERTION N/A 12/21/2023   Procedure: LAPAROSCOPIC INSERTION CONTINUOUS AMBULATORY PERITONEAL DIALYSIS  (CAPD) CATHETER; LAPAROSCOPIC OMENTUMPEXY;  Surgeon: Magda Ned SAILOR, MD;  Location: [facility] OR;  Service: Vascular;  Laterality: N/A;   CYSTOSCOPY W/ URETERAL STENT REMOVAL  11/09/2012   Procedure: CYSTOSCOPY WITH STENT REMOVAL;  Surgeon: Ricardo Likens, MD;  Location: [facility];  Service: Urology;  Laterality: Right;   CYSTOSCOPY WITH RETROGRADE PYELOGRAM, URETEROSCOPY AND STENT PLACEMENT  11/09/2012   Procedure: CYSTOSCOPY WITH RETROGRADE PYELOGRAM, URETEROSCOPY AND STENT PLACEMENT;  Surgeon: Ricardo Likens, MD;  Location: [facility];  Service: Urology;  Laterality: Left;  LEFT URETEROSCOPY, STONE MANIPULATION, left STENT exchange    CYSTOSCOPY WITH STENT PLACEMENT  10/02/2012   Procedure: CYSTOSCOPY WITH STENT PLACEMENT;  Surgeon: Ricardo Likens, MD;  Location: [facility];  Service: Urology;  Laterality: Left;   DEBRIDEMENT AND CLOSURE WOUND N/A 01/20/2021   Procedure: Excision of abdominal wound with closure;  Surgeon: Lowery Estefana RAMAN, DO;  Location: [facility];  Service: Plastics;  Laterality: N/A;   DILATION AND CURETTAGE OF UTERUS  11/2002   s/p for 1st trimester nonviable pregnancy   ESOPHAGOGASTRODUODENOSCOPY N/A 09/11/2024   Procedure: EGD (ESOPHAGOGASTRODUODENOSCOPY);  Surgeon: Abran Norleen SAILOR, MD;  Location: [facility]COPY;  Service: Gastroenterology;  Laterality: N/A;   EYE SURGERY     sty under eyelid    INCISE AND DRAIN ABCESS  08/2002   s/p I &D for righ inframmary fold hidradenitis   INCISION AND DRAINAGE PERITONSILLAR ABSCESS  12/2001   IR CV LINE INJECTION  06/07/2018   IR FLUORO GUIDE CV LINE RIGHT  01/30/2023   IR IMAGING GUIDED PORT INSERTION  06/20/2018   IR REMOVAL TUN ACCESS W/ PORT W/O FL MOD SED  06/20/2018   IR US  GUIDE VASC ACCESS RIGHT  01/30/2023   IRRIGATION AND DEBRIDEMENT ABSCESS  01/31/2012   Procedure: IRRIGATION AND DEBRIDEMENT ABSCESS;  Surgeon: Alm VEAR Angle, MD;  Location: [facility];  Service: General;  Laterality: Right;  right breast and axilla    LAPAROSCOPIC REPOSITIONING CAPD CATHETER Left 01/10/2024   Procedure: Removal of peritoneal dialysis Catheter.;  Surgeon: Magda Ned SAILOR, MD;  Location: [facility] OR;  Service: Vascular;  Laterality: Left;   LAPAROSCOPY N/A 01/10/2024   Procedure: LAPAROSCOPY, DIAGNOSTIC;  Surgeon: Magda Ned SAILOR, MD;  Location: [facility] OR;  Service: Vascular;  Laterality: N/A;   NEPHROLITHOTOMY  10/02/2012   Procedure: NEPHROLITHOTOMY PERCUTANEOUS;  Surgeon: Ricardo Likens, MD;  Location: [facility];  Service: Urology;  Laterality: Right;  First Stage Percutaneous Nephrolithotomy with Surgeon Access, Left Ureteral Stent     NEPHROLITHOTOMY  10/04/2012   Procedure: NEPHROLITHOTOMY PERCUTANEOUS SECOND LOOK;  Surgeon: Ricardo Likens, MD;  Location: [facility];  Service: Urology;  Laterality: Right;      NEPHROLITHOTOMY  10/08/2012   Procedure: NEPHROLITHOTOMY PERCUTANEOUS;  Surgeon: Ricardo Likens, MD;  Location: [facility];  Service: Urology;  Laterality: Right;  THIRD STAGE, nephrostomy tube exchange x 2   NEPHROLITHOTOMY  10/11/2012   Procedure: NEPHROLITHOTOMY PERCUTANEOUS SECOND LOOK;  Surgeon: Ricardo Likens, MD;  Location: [facility];  Service: Urology;  Laterality: Right;  RIGHT 4 STAGE PERCUTANOUS NEPHROLITHOTOMY, right URETEROSCOPY WITH HOLMIUM LASER    PANNICULECTOMY N/A 12/21/2020   Procedure: PANNICULECTOMY;  Surgeon: Lowery Estefana RAMAN, DO;   Location: [facility];  Service: Plastics;  Laterality: N/A;   PERCUTANEOUS NEPHROSTOLITHOTOMY  04/2022   PORT-A-CATH REMOVAL N/A 07/16/2020   Procedure: REMOVAL PORT-A-CATH;  Surgeon: Vanderbilt Ned, MD;  Location: [facility];  Service: General;  Laterality: N/A;   PORTACATH PLACEMENT Left 05/17/2018   Procedure: INSERTION PORT-A-CATH;  Surgeon: Vernetta Berg, MD;  Location: [facility];  Service: General;  Laterality: Left;   RADICAL NECK DISSECTION  04/12/2017   limited/notes 04/12/2017   RADICAL NECK DISSECTION N/A 04/12/2017   Procedure: RADICAL NECK DISSECTION;  Surgeon: Carlie Clark, MD;  Location: [facility] OR;  Service: ENT;  Laterality: N/A;  limited neck dissection 2 hours total   REDUCTION MAMMAPLASTY Bilateral 1998   RIGHT/LEFT HEART CATH AND CORONARY ANGIOGRAPHY N/A 03/12/2020   Procedure: RIGHT/LEFT HEART CATH AND CORONARY ANGIOGRAPHY;  Surgeon: Cherrie Toribio SAUNDERS, MD;  [facility];  Service: Cardiovascular;  Laterality: N/A;   RIGHT/LEFT HEART CATH AND CORONARY ANGIOGRAPHY N/A 09/26/2024   Procedure: RIGHT/LEFT HEART CATH AND CORONARY ANGIOGRAPHY;  Surgeon: Darron Deatrice LABOR, MD;  [facility];  Service: Cardiovascular;  Laterality: N/A;   [place]  1994   TEE WITHOUT CARDIOVERSION N/A 08/25/2023   Procedure: TRANSESOPHAGEAL ECHOCARDIOGRAM;  Surgeon: Lucas Dorise POUR, MD;  Location: [facility];  Service: Open Heart Surgery;  Laterality: N/A;   TENCKHOFF CATHETER INSERTION Left 01/10/2024   Procedure: INSERTION, CATHETER, DIALYSIS, PERITONEAL;  Surgeon: Magda Ned SAILOR, MD;  Location: [facility];  Service: Vascular;  Laterality: Left;   THORACIC AORTIC ENDOVASCULAR STENT GRAFT N/A 02/02/2023   Procedure: THORACIC AORTIC ENDOVASCULAR STENT GRAFT;  Surgeon: Lanis Fonda BRAVO, MD;  Location: [facility] OR;  Service: Vascular;  Laterality: N/A;   THORACIC AORTIC ENDOVASCULAR STENT GRAFT N/A 08/25/2023   Procedure: THORACIC AORTIC ENDOVASCULAR STENT  GRAFT;  Surgeon: Lanis Fonda BRAVO, MD;  Location: [facility] OR;  Service: Vascular;  Laterality: N/A;   THYROIDECTOMY  04/12/2017   completion/notes 04/12/2017   THYROIDECTOMY N/A 04/12/2017   Procedure: THYROIDECTOMY;  Surgeon: Carlie Clark, MD;  Location: [facility] OR;  Service: ENT;  Laterality: N/A;  Completion Thyroidectomy   TOTAL THYROIDECTOMY  2010   ULTRASOUND GUIDANCE FOR VASCULAR ACCESS Right 08/25/2023   Procedure: ULTRASOUND GUIDANCE FOR VASCULAR ACCESS, RIGHT FEMORAL ARTERY;  Surgeon: Lanis Fonda BRAVO, MD;  Location: [facility];  Service: Vascular;  Laterality: Right;    Assessment & Plan Clinical Impression: Patient is a 58 y.o. female with PMHx Graves disease, HTN, T2DM, ESRD on PD, HIV, aortic dissection s/p repair 08/2023, recent hx of pituitary apoplexy, papillary thyroid  carcinoma s/p thyroidectomy, sarcoidosis, OSA, and breast cancer previously on chemotherapy presented to the ER ON 09/23/24 from dialysis with complaints of back pain, abdominal pain and dyspnea. EMS noted BP 90/60 with decreased radial pulses, BP in ER 64/30. She received  IVF resuscitation via I/O improving blood pressures. A CTA chest/abd/pelv revealed stable thoracoabdominal aortic aneurysm and dissection with no significant change with no evidence of aortic leak or rupture, persistent pneumoperitoneum and pelvic free fluid in the setting of peritoneal dialysis catheter. The patient was recently admitted with similar presentation from 11/22-11/27 where workup was negative for PD catheter associated peritonitis.    EKG demonstrated left bundle branch lock with PACs. Labs: high-sensitivity troponins 338, 392, 331, 341, sodium of 130, K+ 2.8, BUN of 39, cr 14.09, mag 2.6, WBC 7K, and hemoglobin 11.8. CXR showed no active disease.  MRI of the lumbar and thoracic spine was unrevealing for any acute findings. Central line was placed to continue IV hydration. Nephrology consulted for ESRD/PD with recommendations for empiric hydrocortisone   and fludrocortisone  as adrenal insufficiency ws suspected. She was continued on PD with electrolyte replacement. CCM admitted for septic shock. An echocardiogram was completed showing decreased LVEF 30-35% with moderate to severe MR. Cardiology consulted and the patient underwent left and right heart catheterizations with normal findings. Vascular Surgery consulted for hypotension in the setting of aortic repair, indicating no further interventions at this time.    Per chart review the patient lives in a two-level home with the ability to live on the first level. She was independent prior to arrival with use of rolling walker more recently and has become more sedentary overall. She currently requires min assist to stand, RW for support and min A to steady. Patient continues to be limited by decreased activity, impaired balance/postural reactions and global weakness. Therapy evaluations completed due to patient decreased functional mobility was admitted for a comprehensive rehab program. Patient transferred to CIR on 09/27/2024 .   Patient currently requires min assist with mobility secondary to muscle weakness, decreased cardiorespiratoy endurance, and decreased standing balance and decreased balance strategies.  Prior to hospitalization, patient was independent  with mobility and lived with Alone, Daughter in a Other(Comment) (Townhouse) home.  Home access is 3Stairs to enter.  Patient will benefit from skilled PT intervention to maximize safe functional mobility, minimize fall risk, and decrease caregiver burden for planned discharge home with 24 hour supervision.  Anticipate patient will benefit from follow up HH at discharge.  PT - End of Session Activity Tolerance: Tolerates 30+ min activity with multiple rests Endurance Deficit: Yes PT Assessment Rehab Potential (ACUTE/IP ONLY): Good PT Barriers to Discharge: Inaccessible home environment;Decreased caregiver support;Home environment  access/layout;IV antibiotics;Incontinence;Wound Care;Lack of/limited family support;Insurance for SNF coverage;Hemodialysis (IV antivirals; peritoneal dialysis) PT Patient demonstrates impairments in the following area(s): Balance;Edema;Endurance;Motor;Pain;Perception;Safety;Sensory;Skin Integrity;Nutrition PT Transfers Functional Problem(s): Bed Mobility;Bed to Chair;Car;Furniture PT Locomotion Functional Problem(s): Ambulation;Stairs PT Plan PT Intensity: Minimum of 1-2 x/day ,45 to 90 minutes PT Frequency: 5 out of 7 days PT Duration Estimated Length of Stay: 10-14 days PT Treatment/Interventions: Ambulation/gait training;Community reintegration;DME/adaptive equipment instruction;Neuromuscular re-education;Psychosocial support;Stair training;UE/LE Strength taining/ROM;UE/LE Coordination activities;Therapeutic Activities;Skin care/wound management;Pain management;Functional electrical stimulation;Discharge planning;Balance/vestibular training;Cognitive remediation/compensation;Disease management/prevention;Functional mobility training;Patient/family education;Splinting/orthotics;Therapeutic Exercise;Visual/perceptual remediation/compensation PT Transfers Anticipated Outcome(s): Mod I PT Locomotion Anticipated Outcome(s): Mod I/ supervision PT Recommendation Recommendations for Other Services: Therapeutic Recreation consult Therapeutic Recreation Interventions: Pet therapy;Kitchen group;Stress management;Outing/community reintergration Follow Up Recommendations: Home health PT;Outpatient PT;24 hour supervision/assistance Patient destination: Home Equipment Recommended: To be determined   PT Evaluation Precautions/Restrictions Precautions Precautions: Fall Recall of Precautions/Restrictions: Intact Precaution/Restrictions Comments: PD catheter at mid lower abdomen, triple lumen central line in R jugular, on ART for HIV(+) Restrictions Weight Bearing Restrictions Per Provider Order:  No General   Vital  SignsTherapy Vitals Temp: 98.4 F (36.9 C) Temp Source: Oral Pulse Rate: 92 Resp: 16 BP: 138/72 Patient Position (if appropriate): Lying Oxygen  Therapy SpO2: 100 % O2 Device: Room Air Patient Activity (if Appropriate): In bed Pulse Oximetry Type: Intermittent Pain Pain Assessment Pain Scale: 0-10 Pain Score: 0-No pain Pain Type: Acute pain Pain Location: Other (Comment) (r flank) Pain Orientation: Right;Anterior Pain Radiating Towards: Radiating from area under right breast across area to lower right flank Pain Descriptors / Indicators: Aching;Discomfort Pain Frequency: Intermittent Pain Onset: Sudden Pain Intervention(s): Medication (See eMAR) Multiple Pain Sites: No Pain Interference Pain Interference Pain Effect on Sleep: 1. Rarely or not at all Pain Interference with Therapy Activities: 2. Occasionally Pain Interference with Day-to-Day Activities: 2. Occasionally Home Living/Prior Functioning Home Living Available Help at Discharge: Family;Available 24 hours/day Type of Home: Other(Comment) (Townhouse) Home Access: Stairs to enter [employer] of Steps: 3 Entrance Stairs-Rails: None Home Layout: Two level;Bed/bath upstairs Alternate Level Stairs-Number of Steps: 12-14 (pt-related #) Alternate Level Stairs-Rails: Right;Left;Can reach both Bathroom Shower/Tub: [employer]: Standard Bathroom Accessibility: Yes Additional Comments: in decline over past 3 months, family has been bringing her food. bc she says that she has lost her appetite and she is not eating, so she has gone from 220lbs to 160lbs (eating once a day), has fridge and microwave in spare room upstairs  Lives With: Alone;Daughter Prior Function Level of Independence: Independent with basic ADLs;Independent with gait;Independent with transfers;Requires assistive device for independence  Able to Take Stairs?: Yes (with help recently) Driving:  Yes Vocation: On disability Leisure: Hobbies-yes (Comment) (Cleaning) Vision/Perception  Vision - History Ability to See in Adequate Light: 0 Adequate Perception Perception: Within Functional Limits Praxis Praxis: WFL  Cognition Overall Cognitive Status: Within Functional Limits for tasks assessed Arousal/Alertness: Awake/alert Orientation Level: Oriented X4 Memory: Appears intact Awareness: Appears intact Problem Solving: Appears intact Safety/Judgment: Appears intact Sensation Sensation Light Touch: Appears Intact Coordination Gross Motor Movements are Fluid and Coordinated: No Fine Motor Movements are Fluid and Coordinated: No Coordination and Movement Description: movement and balance affected by decreased strength and low tolerance to activity Motor  Motor Motor: Within Functional Limits Motor - Skilled Clinical Observations: movement and balance mainly affected by decreased strength and low tolerance to activity   Trunk/Postural Assessment  Cervical Assessment Cervical Assessment: Within Functional Limits Thoracic Assessment Thoracic Assessment: Exceptions to [facility] (mild rounded shoulders) Lumbar Assessment Lumbar Assessment: Within Functional Limits Postural Control Postural Control: Deficits on evaluation Righting Reactions: mildly delayed Protective Responses: mildly decreased  Balance Balance Balance Assessed: Yes Static Sitting Balance Static Sitting - Balance Support: No upper extremity supported Static Sitting - Level of Assistance: 6: Modified independent (Device/Increase time) Dynamic Sitting Balance Dynamic Sitting - Balance Support: Left upper extremity supported;Right upper extremity supported;Feet supported;During functional activity Dynamic Sitting - Level of Assistance: Other (comment) (CGA) Reach (Patient is able to reach ___ inches to right, left, forward, back): requires CGA for far reach; supervision for reaching inside of BOS Static Standing  Balance Static Standing - Balance Support: No upper extremity supported Static Standing - Level of Assistance: 6: Modified independent (Device/Increase time);5: Stand by assistance Dynamic Standing Balance Dynamic Standing - Balance Support: Right upper extremity supported;Left upper extremity supported Dynamic Standing - Level of Assistance: Other (comment);4: Min assist (CGA) Extremity Assessment  RUE Assessment Passive Range of Motion (PROM) Comments: WFL Active Range of Motion (AROM) Comments: WF; General Strength Comments: 3/5,MMT LUE Assessment Passive Range of Motion (PROM) Comments: WFL Active Range  of Motion (AROM) Comments: WFL General Strength Comments: 3/5MMT RLE Assessment RLE Assessment: Exceptions to [facility] General Strength Comments: functionally, grossly 4-/5 LLE Assessment LLE Assessment: Exceptions to [facility] General Strength Comments: functionally, grossly 4-/5  Care Tool Care Tool Bed Mobility Roll left and right activity   Roll left and right assist level: Supervision/Verbal cueing    Sit to lying activity   Sit to lying assist level: Minimal Assistance - Patient > 75%    Lying to sitting on side of bed activity         Care Tool Transfers Sit to stand transfer   Sit to stand assist level: Minimal Assistance - Patient > 75%    Chair/bed transfer   Chair/bed transfer assist level: Minimal Assistance - Patient > 75%    Car transfer   Car transfer assist level: Minimal Assistance - Patient > 75%;Contact Guard/Touching assist      Care Tool Locomotion Ambulation   Assist level: Minimal Assistance - Patient > 75% Assistive device: Hand held assist Max distance: 20 ft  Walk 10 feet activity   Assist level: Contact Guard/Touching assist Assistive device: Walker-rolling   Walk 50 feet with 2 turns activity Walk 50 feet with 2 turns activity did not occur: Safety/medical concerns      Walk 150 feet activity Walk 150 feet activity did not occur:  Safety/medical concerns      Walk 10 feet on uneven surfaces activity Walk 10 feet on uneven surfaces activity did not occur: Safety/medical concerns      Stairs Stair activity did not occur: Safety/medical concerns        Walk up/down 1 step activity Walk up/down 1 step or curb (drop down) activity did not occur: Safety/medical concerns      Walk up/down 4 steps activity Walk up/down 4 steps activity did not occur: Safety/medical concerns      Walk up/down 12 steps activity Walk up/down 12 steps activity did not occur: Safety/medical concerns      Pick up small objects from floor   Pick up small object from the floor assist level: Maximal Assistance - Patient 25 - 49%    Wheelchair Is the patient using a wheelchair?: Yes (may not require by d/c for use at home) Type of Wheelchair: Manual   Wheelchair assist level: Dependent - Patient 0% Max wheelchair distance: 300 ft  Wheel 50 feet with 2 turns activity   Assist Level: Dependent - Patient 0%  Wheel 150 feet activity   Assist Level: Dependent - Patient 0%    Refer to Care Plan for Long Term Goals  SHORT TERM GOAL WEEK 1 PT Short Term Goal 1 (Week 1): Pt will perform all standing transfers with SBA. PT Short Term Goal 2 (Week 1): Pt will ambulate at least 60 ft per bout using LRAD with overall SBA demonstrating improved activity tolerance. PT Short Term Goal 3 (Week 1): Pt will initiate stair training. PT Short Term Goal 4 (Week 1): Pt will perform appropriate outcome measure.  Recommendations for other services: Neuropsych and Therapeutic Recreation  Pet therapy, Kitchen group, Stress management, and Outing/community reintegration  Skilled Therapeutic Intervention Mobility Bed Mobility Bed Mobility: Rolling Right;Rolling Left;Supine to Sit;Sit to Supine Rolling Right: Supervision/verbal cueing Rolling Left: Supervision/Verbal cueing Supine to Sit: Minimal Assistance - Patient > 75% Sit to Supine: Minimal Assistance  - Patient > 75% Transfers Transfers: Sit to Stand;Stand to Sit;Stand Pivot Transfers Sit to Stand: Minimal Assistance - Patient > 75%;Contact Guard/Touching assist Stand to  Sit: Minimal Assistance - Patient > 75%;Contact Guard/Touching assist Stand Pivot Transfers: Contact Guard/Touching assist;Minimal Assistance - Patient > 75% Transfer (Assistive device): Rolling walker Locomotion  Gait Ambulation: Yes Gait Assistance: Contact Guard/Touching assist;Minimal Assistance - Patient > 75% Gait Distance (Feet): 20 Feet Assistive device: 1 person hand held assist Gait Gait: Yes Gait Pattern: Step-through pattern;Decreased stride length;Decreased hip/knee flexion - right;Decreased hip/knee flexion - left Gait velocity: decreased Stairs / Additional Locomotion Stairs: No Wheelchair Mobility Wheelchair Mobility: No  Skilled Intervention: PT Evaluation completed; see above for results. PT educated patient in roles of PT vs OT, PT POC, rehab potential, rehab goals, and discharge recommendations along with recommendation for follow-up rehabilitation services. Individual treatment initiated:  Patient long sitting in bed upon PT arrival. Patient alert and agreeable to PT session.   No pain complaint during session.  Of note, pt relates fatigue in mornings after PD running overnight each night. She did not eat much breakfast but ate the most she could.   On reaching EOB, she requires MinA to only to complete to upright position on EOB. Ambulates to bathroom/ back to bed by furniture surfing  and with HHA. Relates that she does this at home since there is no room to use a RW. Related potential need for de-cluttering in order to promote safe ambulation in home. Is able to complete pericare with supervision/ setup.   Enters/ exits car with step-in technique while holding overhead handle on car frame. Related to pt potential for need to perform seat-pivot dependent on overall level of fatigue/ weakness  as well as strength of hip flexion. May need to practice car transfers to determine best entry for sister's Jeep.   Has 2-3 steps on way from parking space to enter home - first step is curb, then step up to porch, then threshold step. Can potentially perform with RW and will need to assess ability to perform in next session.   Patient seated upright in w/c on return to room with NT present and changing sheets on bed.  At end of session, she is left with brakes locked, no  alarm set, and all needs within reach. NT supervising and to assist pt back to bed upon linen change.    Discharge Criteria: Patient will be discharged from PT if patient refuses treatment 3 consecutive times without medical reason, if treatment goals not met, if there is a change in medical status, if patient makes no progress towards goals or if patient is discharged from hospital.  The above assessment, treatment plan, treatment alternatives and goals were discussed and mutually agreed upon: by patient  Mliss DELENA Milliner PT, DPT, CSRS 09/28/2024, 2:05 PM

## 2024-09-28 NOTE — Progress Notes (Addendum)
 PROGRESS NOTE   Subjective/Complaints: Continued back pain, overall controlled. She feels tired after dialysis as usual for her. Has continued decreased appetite.   ROS: Patient denies fever, new vision changes, dizziness, vomiting,   shortness of breath or chest pain, headache, or mood change. +diarrhea, nausea, back pain, fatigue   Objective:   CARDIAC CATHETERIZATION Result Date: 09/26/2024 1.  Tortuous but normal coronary arteries. 2.  Left ventricular angiography was not performed.  EF was severely reduced by echo. 3.  Right heart catheterization showed normal right atrial pressure, mild pulmonary hypertension, mildly elevated wedge pressure and normal cardiac output RA: 5 mmHg RV: 37/8 mmHg PW: 18 mmHg PA: 37/18 with a mean of 23 mmHg Cardiac output/index: 5.59/2.94. Recommendations: Medical therapy for nonischemic cardiomyopathy.   Recent Labs    09/26/24 0532 09/26/24 1454 09/26/24 1458 09/27/24 0356  WBC 16.1*  --   --  12.9*  HGB 9.8*   < > 10.2* 9.4*  HCT 28.8*   < > 30.0* 27.8*  PLT 226  --   --  247   < > = values in this interval not displayed.   Recent Labs    09/26/24 0532 09/26/24 1454 09/26/24 1458 09/27/24 0356  NA 130*   < > 131* 131*  K 3.8   < > 4.2 3.9  CL 94*  --   --  94*  CO2 22  --   --  20*  GLUCOSE 166*  --   --  143*  BUN 37*  --   --  38*  CREATININE 10.62*  --   --  11.00*  CALCIUM  8.6*  --   --  8.3*   < > = values in this interval not displayed.    Intake/Output Summary (Last 24 hours) at 09/28/2024 1133 Last data filed at 09/28/2024 0900 Gross per 24 hour  Intake 417 ml  Output --  Net 417 ml        Physical Exam: Vital Signs Blood pressure 115/75, pulse 90, temperature 98.5 F (36.9 C), temperature source Oral, resp. rate 16, height 5' 5 (1.651 m), weight 77.9 kg, last menstrual period 03/31/2014, SpO2 100%.  General: No apparent distress, laying in bed HEENT:  Head is normocephalic, atraumatic, oral mucosa a little dry Heart: Reg rate and rhythm.  Chest: CTA bilaterally, nonlabored breathing Abdomen: Soft, mildly tender, non-distended, bowel sounds positive. Extremities: No clubbing, cyanosis, or edema. Pulses are 2+ Psych: Pt's affect is appropriate. Pt is cooperative Skin: PD site clean, dressed. A few scattered bruises, dry skin  Neuro:   Alert and awake. Normal insight and awareness. Normal language and speech. Cranial nerve exam unremarkable. Moving all 4 extremities to gravity and resistance. Stocking glove sensory loss below ankles and to a lesser extent in both hands.  No abnl resting tone.    Assessment/Plan: 1. Functional deficits which require 3+ hours per day of interdisciplinary therapy in a comprehensive inpatient rehab setting. Physiatrist is providing close team supervision and 24 hour management of active medical problems listed below. Physiatrist and rehab team continue to assess barriers to discharge/monitor patient progress toward functional and medical goals  Care Tool:  Bathing  Bathing assist       Upper Body Dressing/Undressing Upper body dressing        Upper body assist      Lower Body Dressing/Undressing Lower body dressing            Lower body assist       Toileting Toileting    Toileting assist       Transfers Chair/bed transfer  Transfers assist           Locomotion Ambulation   Ambulation assist              Walk 10 feet activity   Assist           Walk 50 feet activity   Assist           Walk 150 feet activity   Assist           Walk 10 feet on uneven surface  activity   Assist           Wheelchair     Assist               Wheelchair 50 feet with 2 turns activity    Assist            Wheelchair 150 feet activity     Assist          Blood pressure 115/75, pulse 90, temperature 98.5 F  (36.9 C), temperature source Oral, resp. rate 16, height 5' 5 (1.651 m), weight 77.9 kg, last menstrual period 03/31/2014, SpO2 100%.   Medical Problem List and Plan: 1. Functional deficits secondary to debility related to circulatory shock, adrenal insufficiency/pituitary apoplexy, new CHF             -patient may  shower             -ELOS/Goals: 7-10 days, mod I goals.              -family is looking at some options for intermittent assistance at home  -Continue CIR   2.  Antithrombotics: -DVT/anticoagulation:  Mechanical: Sequential compression devices, below knee Bilateral lower extremities Pharmaceutical: Heparin              -antiplatelet therapy: Aspirin     3. Pain Management:  Tylenol  and Oxycodone  prn.   4. Mood/Behavior/Sleep: LCSW to follow for evaluation and support when available.              -antipsychotic agents: Buspar  10 mg BID prn for anxiety and Remeron  7.5 mg at bedtime.              -pt seems positive. Family very supportive 5. Neuropsych/cognition: This patient is capable of making decisions on her own behalf.   6. Skin/Wound Care: Routine pressure relief measures.    7. Fluids/Electrolytes/Nutrition: Monitor I&O and weight. Follow up labs CBC/CMP               -GERD: Omeprazole  40 mg bid    8. 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    9.ESRD: Continue nightly PD per nephrology.              -pt was independent with management of her PD at home  -12/13 Completed PD this AM  10.Hx of papillary thyroid  cancer s/p thyroidectomy: TSH normal, continue Synthroid .    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    12. HFrEF, without acute  exacerbation: Nephrology to manage volume status. Cardiology following. Continue heart healthy diet and GDMT as tolerated. Daily weights.  Filed Weights   09/28/24 0635  Weight: 77.9 kg      13. Low  back pain/Abdominal pain: present since dissection. Continue to monitor. MRI T and L spine negative, RUQ US  negative for acute findings.              -continue Lidocaine  patch              -pt is able to work through discomfort  -12/13 pt reports controlled with current regimen  14. HTN: Monitor BP per protocol, continue Metoprolol        09/28/2024    8:06 AM 09/28/2024    6:35 AM 09/28/2024    5:24 AM  Vitals with BMI  Weight  171 lbs 11 oz   BMI  28.57   Systolic 115  130  Diastolic 75  73  Pulse 90  89    15. HLD: Crestor  10 mg    16. HIV: CD4 445 on 10/15. Continue home [name]  and Descovy                17. T2DM: Monitor glucose ac/hs with SSI.              -associated peripheral neuropathy  -12/13 CBGs stable   -CBG (last 3)  Recent Labs    09/27/24 1624 09/27/24 2032 09/28/24 0524  GLUCAP 109* 135* 109*   18. Chronic nausea/poor appetite  -12/13 Ate 50%, continue monitor intake  -Continue Remaron  19. Diarrhea  -12/13 LBM recorded yesterday type 4, medium, continue to monitor    LOS: 1 days A FACE TO FACE EVALUATION WAS PERFORMED  Murray Collier 09/28/2024, 11:33 AM

## 2024-09-29 DIAGNOSIS — I5020 Unspecified systolic (congestive) heart failure: Secondary | ICD-10-CM | POA: Diagnosis not present

## 2024-09-29 DIAGNOSIS — N186 End stage renal disease: Secondary | ICD-10-CM | POA: Diagnosis not present

## 2024-09-29 DIAGNOSIS — R630 Anorexia: Secondary | ICD-10-CM | POA: Diagnosis not present

## 2024-09-29 LAB — GLUCOSE, CAPILLARY
Glucose-Capillary: 93 mg/dL (ref 70–99)
Glucose-Capillary: 118 mg/dL — ABNORMAL HIGH (ref 70–99)
Glucose-Capillary: 110 mg/dL — ABNORMAL HIGH (ref 70–99)
Glucose-Capillary: 97 mg/dL (ref 70–99)

## 2024-09-29 LAB — PHOSPHORUS: Phosphorus: 7 mg/dL — ABNORMAL HIGH (ref 2.5–4.6)

## 2024-09-29 LAB — MAGNESIUM: Magnesium: 2.1 mg/dL (ref 1.7–2.4)

## 2024-09-29 MED ORDER — SACCHAROMYCES BOULARDII 250 MG PO CAPS
250.0000 mg | ORAL_CAPSULE | Freq: Every day | ORAL | Status: DC
  Administered 2024-09-29 – 2024-10-08 (×10): 250 mg via ORAL
  Filled 2024-09-29 (×10): qty 1

## 2024-09-29 MED ORDER — SEVELAMER CARBONATE 800 MG PO TABS
800.0000 mg | ORAL_TABLET | Freq: Three times a day (TID) | ORAL | Status: DC
  Administered 2024-09-29 – 2024-10-02 (×9): 800 mg via ORAL
  Filled 2024-09-29 (×11): qty 1

## 2024-09-29 MED ADMIN — Hydrocortisone Sodium Succinate PF For Inj 100 MG: 50 mg | INTRAVENOUS | NDC 00009001103

## 2024-09-29 NOTE — Progress Notes (Signed)
 Placed call to hemodialysis department x 3 to check on status of getting patient connected for PD tonight. No response. Contacted house coverage about situation and was provided with alternate number- left message but no response. Also contacted operator for an alternate oncall number. No response, left message. Contacted nephrology on call Schertz MD and informed him of the situation. No other alternate numbers available. No new orders at this time. Nephrology to review in the morning. Patient aware of the delay. Patient reports no concerns at this time.

## 2024-09-29 NOTE — Progress Notes (Signed)
 PROGRESS NOTE   Subjective/Complaints: No new complaints or concerns this AM. She had a head CT after right side of her head was tapped on door frame yesterday- no acute findings. Pt reports her back/flank pain is controlled.  Pt asking about her renvela , discussed with nephrology who plans to restart.   ROS: Patient denies fever, new vision changes, dizziness, vomiting,   shortness of breath or chest pain, headache, or mood change. +diarrhea,  +Back pain controlled   Objective:   CT HEAD WO CONTRAST ( ) Result Date: 09/29/2024 EXAM: CT HEAD WITHOUT 09/28/2024 11:54:00 PM TECHNIQUE: CT of the head was performed without the administration of intravenous contrast. Automated exposure control, iterative reconstruction, and/or weight based adjustment of the mA/kV was utilized to reduce the radiation dose to as low as reasonably achievable. COMPARISON: 07/30/2024 CLINICAL HISTORY: Head trauma, minor, normal mental status (Age 67-64y); ?trauma, hit head on door frame during transfer with family. FINDINGS: BRAIN AND VENTRICLES: No acute intracranial hemorrhage. No mass effect or midline shift. No extra-axial fluid collection. No evidence of acute infarct. No hydrocephalus. Intracranial atherosclerosis. Prior pituitary mass is less conspicuous on the current study. ORBITS: No acute abnormality. SINUSES AND MASTOIDS: No acute abnormality. SOFT TISSUES AND SKULL: No acute skull fracture. No acute soft tissue abnormality. IMPRESSION: 1. No acute intracranial abnormality. Electronically signed by: Pinkie Pebbles MD 09/29/2024 12:08 AM EST RP Workstation: [id number]   Recent Labs    09/27/24 0356 09/28/24 0500  WBC 12.9* 13.2*  HGB 9.4* 8.6*  HCT 27.8* 25.5*  PLT 247 233   Recent Labs    09/27/24 0356 09/28/24 0500  NA 131* 135  K 3.9 3.9  CL 94* 94*  CO2 20* 23  GLUCOSE 143* 110*  BUN 38* 43*  CREATININE 11.00* 11.90*  CALCIUM  8.3*  8.5*    Intake/Output Summary (Last 24 hours) at 09/29/2024 1022 Last data filed at 09/28/2024 1849 Gross per 24 hour  Intake 355.5 ml  Output --  Net 355.5 ml        Physical Exam: Vital Signs Blood pressure 130/71, pulse 92, temperature 98 F (36.7 C), temperature source Oral, resp. rate 16, height 5' 5 (1.651 m), weight 78.1 kg, last menstrual period 03/31/2014, SpO2 100%.  General: No apparent distress, laying in bed appears comfortable HEENT: Head is normocephalic, atraumatic, oral mucosa moist Heart: Reg rate and rhythm.  Chest: CTA bilaterally, nonlabored breathing Abdomen: Soft, mildly tender, non-distended, bowel sounds positive. Extremities: No significant lower extremity edema noted Psych: Pt's affect is appropriate. Pt is cooperative Skin: PD site clean, dressed. A few scattered bruises Neuro:   Alert and awake. Normal insight and awareness. Normal language and speech. Cranial nerve exam unremarkable. Moving all 4 extremities to gravity and resistance. Stocking glove sensory loss below ankles and to a lesser extent in both hands.  No abnl resting tone.    Assessment/Plan: 1. Functional deficits which require 3+ hours per day of interdisciplinary therapy in a comprehensive inpatient rehab setting. Physiatrist is providing close team supervision and 24 hour management of active medical problems listed below. Physiatrist and rehab team continue to assess barriers to discharge/monitor patient progress toward functional and medical goals  Care Tool:  Bathing    Body parts bathed by patient: Right arm, Left arm, Chest, Abdomen, Front perineal area, Buttocks, Right upper leg, Left upper leg, Face   Body parts bathed by helper: Left lower leg, Right lower leg     Bathing assist Assist Level: Minimal Assistance - Patient > 75%     Upper Body Dressing/Undressing Upper body dressing   What is the patient wearing?: Pull over shirt (simulated using theraband)     Upper body assist Assist Level: Supervision/Verbal cueing    Lower Body Dressing/Undressing Lower body dressing      What is the patient wearing?: Pants (simulated using theraband)     Lower body assist Assist for lower body dressing: Minimal Assistance - Patient > 75%     Toileting Toileting    Toileting assist Assist for toileting: Minimal Assistance - Patient > 75%     Transfers Chair/bed transfer  Transfers assist     Chair/bed transfer assist level: Minimal Assistance - Patient > 75%     Locomotion Ambulation   Ambulation assist      Assist level: Minimal Assistance - Patient > 75% Assistive device: Hand held assist Max distance: 20 ft   Walk 10 feet activity   Assist     Assist level: Contact Guard/Touching assist Assistive device: Walker-rolling   Walk 50 feet activity   Assist Walk 50 feet with 2 turns activity did not occur: Safety/medical concerns         Walk 150 feet activity   Assist Walk 150 feet activity did not occur: Safety/medical concerns         Walk 10 feet on uneven surface  activity   Assist Walk 10 feet on uneven surfaces activity did not occur: Safety/medical concerns         Wheelchair     Assist Is the patient using a wheelchair?: Yes (may not require by d/c for use at home) Type of Wheelchair: Manual    Wheelchair assist level: Dependent - Patient 0% Max wheelchair distance: 300 ft    Wheelchair 50 feet with 2 turns activity    Assist        Assist Level: Dependent - Patient 0%   Wheelchair 150 feet activity     Assist      Assist Level: Dependent - Patient 0%   Blood pressure 130/71, pulse 92, temperature 98 F (36.7 C), temperature source Oral, resp. rate 16, height 5' 5 (1.651 m), weight 78.1 kg, last menstrual period 03/31/2014, SpO2 100%.   Medical Problem List and Plan: 1. Functional deficits secondary to debility related to circulatory shock, adrenal  insufficiency/pituitary apoplexy, new CHF             -patient may  shower             -ELOS/Goals: 7-10 days, mod I goals.              -family is looking at some options for intermittent assistance at home  -Continue CIR  - Head CT completed last night 12/13 when she bumped her head, no acute abnormalities   2.  Antithrombotics: -DVT/anticoagulation:  Mechanical: Sequential compression devices, below knee Bilateral lower extremities Pharmaceutical: Heparin              -antiplatelet therapy: Aspirin     3. Pain Management:  Tylenol  and Oxycodone  prn.   4. Mood/Behavior/Sleep: LCSW to follow for evaluation and support when available.              -  antipsychotic agents: Buspar  10 mg BID prn for anxiety and Remeron  7.5 mg at bedtime.              -pt seems positive. Family very supportive 5. Neuropsych/cognition: This patient is capable of making decisions on her own behalf.   6. Skin/Wound Care: Routine pressure relief measures.    7. Fluids/Electrolytes/Nutrition: Monitor I&O and weight. Follow up labs CBC/CMP               -GERD: Omeprazole  40 mg bid    8. 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    9.ESRD: Continue nightly PD per nephrology.              -pt was independent with management of her PD at home  -12/14, discussed Renvela  with nephrology, they plan to resume  10.Hx of papillary thyroid  cancer s/p thyroidectomy: TSH normal, continue Synthroid .    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    12. HFrEF, without acute exacerbation: Nephrology to manage volume status. Cardiology following. Continue heart healthy diet and GDMT as tolerated. Daily weights-appears stable Filed Weights   09/28/24 0635 09/29/24 0628  Weight: 77.9 kg 78.1 kg      13. Low back pain/Abdominal pain: present since dissection. Continue to monitor. MRI T and L spine negative,  RUQ US  negative for acute findings.              -continue Lidocaine  patch              -pt is able to work through discomfort  -12/14 had some oxycodone  yesterday for her back pain but doing better today  14. HTN: Monitor BP per protocol, continue Metoprolol    - Controlled      09/29/2024    6:28 AM 09/29/2024    4:46 AM 09/29/2024   12:09 AM  Vitals with BMI  Weight 172 lbs 3 oz    BMI 28.65    Systolic  130 125  Diastolic  71 69  Pulse  92 80    15. HLD: Crestor  10 mg    16. HIV: CD4 445 on 10/15. Continue home [name]  and Descovy                17. T2DM: Monitor glucose ac/hs with SSI.              -associated peripheral neuropathy  -12/14 had one high value yesterday 211 but overall controlled continue current regimen   -CBG (last 3)  Recent Labs    09/28/24 1636 09/28/24 2027 09/29/24 0544  GLUCAP 149* 211* 93   18. Chronic nausea/poor appetite  -12/13 Ate 50%, continue monitor intake  -Continue Remaron  19. Diarrhea  -12/13 LBM recorded yesterday type 4, medium, continue to monitor   -pt interested in trying probiotics, will start   LOS: 2 days A FACE TO FACE EVALUATION WAS PERFORMED  Murray Collier 09/29/2024, 10:22 AM

## 2024-09-29 NOTE — Progress Notes (Signed)
  [facility] Progress Note   Subjective:   Reports she has not been getting her renvela . Otherwise no concerns. Denies SOB, CP, dizziness. Appetite is better today.   Objective Vitals:   09/28/24 2058 09/29/24 0009 09/29/24 0446 09/29/24 0628  BP: 133/81 125/69 130/71   Pulse: 93 80 92   Resp: 17 16 16    Temp:  97.9 F (36.6 C) 98 F (36.7 C)   TempSrc:  Oral Oral   SpO2: 100% 100% 100%   Weight:    78.1 kg  Height:       Physical Exam General: alert female in NAD Heart: RRR, no murmurs Lungs: CTA bilaterally, respirations unlabored Abdomen: Soft, non-distended, +BS Extremities: No edema b/l lower extremities Dialysis Access:  PD cath hooked up to cycler  Additional Objective Labs: Basic Metabolic Panel: Recent Labs  Lab 09/26/24 0532 09/26/24 1454 09/26/24 1458 09/27/24 0356 09/28/24 0500 09/29/24 0434  NA 130*   < > 131* 131* 135  --   K 3.8   < > 4.2 3.9 3.9  --   CL 94*  --   --  94* 94*  --   CO2 22  --   --  20* 23  --   GLUCOSE 166*  --   --  143* 110*  --   BUN 37*  --   --  38* 43*  --   CREATININE 10.62*  --   --  11.00* 11.90*  --   CALCIUM  8.6*  --   --  8.3* 8.5*  --   PHOS 4.3  --   --  5.7* 7.1* 7.0*   < > = values in this interval not displayed.   Liver Function Tests: Recent Labs  Lab 09/23/24 1431 09/28/24 0500  AST 19 34  ALT 9 20  ALKPHOS 244* 374*  BILITOT 1.0 1.0  PROT 6.6 6.0*  ALBUMIN  1.9* 2.7*   Recent Labs  Lab 09/23/24 1839  LIPASE 30  AMYLASE 48   CBC: Recent Labs  Lab 09/23/24 1431 09/24/24 0354 09/25/24 0537 09/26/24 0532 09/26/24 1454 09/26/24 1458 09/27/24 0356 09/28/24 0500  WBC 7.8 10.8* 11.7* 16.1*  --   --  12.9* 13.2*  NEUTROABS 5.1  --   --   --   --   --   --  11.9*  HGB 11.8* 9.2* 9.1* 9.8*   < > 10.2* 9.4* 8.6*  HCT 36.9 28.0* 27.5* 28.8*   < > 30.0* 27.8* 25.5*  MCV 111.1* 111.1* 108.7* 108.7*  --   --  108.2* 106.3*  PLT 214 186 180 226  --   --  247 233   < > = values in  this interval not displayed.   Blood Culture    Component Value Date/Time   SDES PERITONEAL DIALYSATE 09/24/2024 0700   SPECREQUEST Immunocompromised ABDOMINAL 09/24/2024 0700   CULT  09/24/2024 0700    NO GROWTH 3 DAYS Performed at [facility], [address] [place], [place] [address]    REPTSTATUS 09/27/2024 FINAL 09/24/2024 0700    Cardiac Enzymes: No results for input(s): CKTOTAL, CKMB, CKMBINDEX, TROPONINI in the last 168 hours. CBG: Recent Labs  Lab 09/28/24 0524 09/28/24 1242 09/28/24 1636 09/28/24 2027 09/29/24 0544  GLUCAP 109* 121* 149* 211* 93   Iron  Studies: No results for input(s): IRON , TIBC, TRANSFERRIN, FERRITIN in the last 72 hours. @lablastinr3 @ Studies/Results: CT HEAD WO CONTRAST ( ) Result Date: 09/29/2024 EXAM: CT HEAD WITHOUT 09/28/2024 11:54:00 PM TECHNIQUE:  CT of the head was performed without the administration of intravenous contrast. Automated exposure control, iterative reconstruction, and/or weight based adjustment of the mA/kV was utilized to reduce the radiation dose to as low as reasonably achievable. COMPARISON: 07/30/2024 CLINICAL HISTORY: Head trauma, minor, normal mental status (Age 72-64y); ?trauma, hit head on door frame during transfer with family. FINDINGS: BRAIN AND VENTRICLES: No acute intracranial hemorrhage. No mass effect or midline shift. No extra-axial fluid collection. No evidence of acute infarct. No hydrocephalus. Intracranial atherosclerosis. Prior pituitary mass is less conspicuous on the current study. ORBITS: No acute abnormality. SINUSES AND MASTOIDS: No acute abnormality. SOFT TISSUES AND SKULL: No acute skull fracture. No acute soft tissue abnormality. IMPRESSION: 1. No acute intracranial abnormality. Electronically signed by: Pinkie Pebbles MD 09/29/2024 12:08 AM EST RP Workstation: [id number]   Medications:  dialysis solution 1.5% low-MG/low-CA      allopurinol   100 mg Oral Daily   aspirin    81 mg Oral Daily   budesonide  (PULMICORT ) nebulizer solution  0.25 mg Nebulization BID   [START ON 09/30/2024] calcitRIOL   0.25 mcg Oral Once per day on Monday Wednesday Friday   Chlorhexidine  Gluconate Cloth  6 each Topical Q12H   dolutegravir   50 mg Oral Daily   emtricitabine -tenofovir  AF  1 tablet Oral Daily   feeding supplement  237 mL Oral BID BM   fludrocortisone   0.1 mg Oral Daily   gentamicin  cream  1 Application Topical Daily   heparin  injection (subcutaneous)  5,000 Units Subcutaneous Q8H   hydrocortisone  sod succinate (SOLU-CORTEF ) inj  50 mg Intravenous Q12H   insulin  aspart  0-5 Units Subcutaneous QHS   insulin  aspart  0-6 Units Subcutaneous TID WC   levothyroxine   150 mcg Oral Q0600   lidocaine   1 patch Transdermal Q24H   loratadine   10 mg Oral QHS   metoprolol  succinate  12.5 mg Oral Daily   mirtazapine   7.5 mg Oral QHS   multivitamin  1 tablet Oral QHS   pantoprazole   40 mg Oral Daily   rosuvastatin   10 mg Oral Daily    Dialysis Orders: CCPD 7x week-[facility] 5 exchanges, fill vol 2250, 1.5hr dwell time, edw 74.5kg  Usually does 1/2 1.5% bags and 1/2 2.5% bags    CXR 12/08: no active disease  Assessment/Plan: Hypotension: 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 BP's better. Now in CIR.  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 Cardiology consulting, w/ sig WMA's they are planning RHC/ LHC. Appreciate assitance.  Euvolemic on exam today, managing volume with dialysis.  ESRD: on CCPD. Cont PD nightly while here.  Volume: no signs of vol overload, admit CXR was negative. Use all 1.5% w/ PD tonight.  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 Follow, transfuse prn.  Tremors: discontinued gabapentin  and this is a cause of tremors/ asterixis in ESRD pts. Improved Secondary hyperparathyroidism: Calcium  contorlled, phos elevated. Continue calcitriol . Resumed renvela .   Lucie Collet,  PA-C 09/29/2024, 10:11 AM  [facility] Pager: [phone]

## 2024-09-29 NOTE — Progress Notes (Signed)
 Patient back from head CT. Vitals stable. No changes noted.

## 2024-09-30 ENCOUNTER — Inpatient Hospital Stay (HOSPITAL_COMMUNITY)

## 2024-09-30 DIAGNOSIS — R5381 Other malaise: Secondary | ICD-10-CM | POA: Diagnosis not present

## 2024-09-30 LAB — BASIC METABOLIC PANEL WITH GFR
Anion gap: 17 — ABNORMAL HIGH (ref 5–15)
BUN: 57 mg/dL — ABNORMAL HIGH (ref 6–20)
CO2: 22 mmol/L (ref 22–32)
Calcium: 7.8 mg/dL — ABNORMAL LOW (ref 8.9–10.3)
Chloride: 94 mmol/L — ABNORMAL LOW (ref 98–111)
Creatinine, Ser: 12.56 mg/dL — ABNORMAL HIGH (ref 0.44–1.00)
GFR, Estimated: 3 mL/min — ABNORMAL LOW (ref >60)
Glucose, Bld: 83 mg/dL (ref 70–99)
Potassium: 3.9 mmol/L (ref 3.5–5.1)
Sodium: 133 mmol/L — ABNORMAL LOW (ref 135–145)

## 2024-09-30 LAB — CBC
HCT: 22.1 % — ABNORMAL LOW (ref 36.0–46.0)
Hemoglobin: 7.6 g/dL — ABNORMAL LOW (ref 12.0–15.0)
MCH: 36.4 pg — ABNORMAL HIGH (ref 26.0–34.0)
MCHC: 34.4 g/dL (ref 30.0–36.0)
MCV: 105.7 fL — ABNORMAL HIGH (ref 80.0–100.0)
Platelets: 230 K/uL (ref 150–400)
RBC: 2.09 MIL/uL — ABNORMAL LOW (ref 3.87–5.11)
RDW: 15.7 % — ABNORMAL HIGH (ref 11.5–15.5)
WBC: 15.3 K/uL — ABNORMAL HIGH (ref 4.0–10.5)
nRBC: 0.7 % — ABNORMAL HIGH (ref 0.0–0.2)

## 2024-09-30 LAB — GLUCOSE, CAPILLARY
Glucose-Capillary: 85 mg/dL (ref 70–99)
Glucose-Capillary: 103 mg/dL — ABNORMAL HIGH (ref 70–99)
Glucose-Capillary: 169 mg/dL — ABNORMAL HIGH (ref 70–99)
Glucose-Capillary: 146 mg/dL — ABNORMAL HIGH (ref 70–99)

## 2024-09-30 LAB — OCCULT BLOOD X 1 CARD TO LAB, STOOL: Fecal Occult Bld: POSITIVE — AB

## 2024-09-30 LAB — PHOSPHORUS: Phosphorus: 8.4 mg/dL — ABNORMAL HIGH (ref 2.5–4.6)

## 2024-09-30 LAB — MAGNESIUM: Magnesium: 2.3 mg/dL (ref 1.7–2.4)

## 2024-09-30 MED ORDER — DARBEPOETIN ALFA 100 MCG/0.5ML IJ SOSY
100.0000 ug | PREFILLED_SYRINGE | INTRAMUSCULAR | Status: DC
  Administered 2024-09-30 – 2024-10-07 (×2): 100 ug via SUBCUTANEOUS
  Filled 2024-09-30 (×2): qty 0.5

## 2024-09-30 MED ORDER — IOHEXOL 350 MG/ML SOLN: 75.0000 mL | Freq: Once | INTRAVENOUS | Status: DC | PRN

## 2024-09-30 MED ADMIN — Hydrocortisone Sodium Succinate PF For Inj 100 MG: 50 mg | INTRAVENOUS | @ 09:00:00 | NDC 00009001103

## 2024-09-30 MED ADMIN — Hydrocortisone Sodium Succinate PF For Inj 100 MG: 50 mg | INTRAVENOUS | @ 21:00:00 | NDC 00009001103

## 2024-09-30 NOTE — Progress Notes (Signed)
 Physical Therapy Session Note  Patient Details  Name: [name] MRN: [id number] Date of Birth: 05-29-1966  Today's Date: 09/30/2024 PT Individual Time: 1420-1540 PT Individual Time Calculation (min): 80 min   Short Term Goals: Week 1:  PT Short Term Goal 1 (Week 1): Pt will perform all standing transfers with SBA. PT Short Term Goal 2 (Week 1): Pt will ambulate at least 60 ft per bout using LRAD with overall SBA demonstrating improved activity tolerance. PT Short Term Goal 3 (Week 1): Pt will initiate stair training. PT Short Term Goal 4 (Week 1): Pt will perform appropriate outcome measure.  Skilled Therapeutic Interventions/Progress Updates:    Pt presents in room in bed, agreeable to PT. Pt reporting slight pain in R flank and low back, improved since this morning with pt reporting premedicated. Session focused on therapeutic activities for transfer training and upright tolerance as well as NMR for dynamic standing balance, BLE strengthening, and BLE coordination. Pt completes bed mobility supine to sit with min assist and stand pivot transfer with RW with CGA. Pt transported to main gym via WC dependently for time management and energy conservation. Pt ambulates 200' with RW, CGA and same person WC follow, pt does not report fatigue, able to complete head turns with minimal postural sway, completed to promote upright tolerance. Pt completes gait 28' without device R HHA with CGA/min assist, decreased gait speed, WBOS, increased lateral postural sway however no LOB. Pt positioned in //bars via WC, completes NMR for dynamic standing balance with decreasing BUE support and BLE muscle fiber recruitment including: - forward/backward walking 2x8' - lateral stepping 2x8' bilaterally - resisted forward/backward walking 2x8' yellow band - resisted lateral stepping x8' bilaterally yellow band - step taps alternating BLE x10 - step ups x5 BLE BUE support on //bars Pt returned to room via  WC dependently for time management, pt doffs shirt, bra and pants with min assist in sitting and standing, dons hospital gown prior to completing bed mobility. Pt completes transfer without device with CGA, completes bed mobility with supervision. Pt remains semi reclined in bed with family present at bedside, all needs within reach, call light in place, and bed alarm activated at end of session.    Therapy Documentation Precautions:  Precautions Precautions: Fall Recall of Precautions/Restrictions: Intact Precaution/Restrictions Comments: PD catheter at mid lower abdomen, triple lumen central line in R jugular, on ART for HIV(+) Restrictions Weight Bearing Restrictions Per Provider Order: No   Therapy/Group: Individual Therapy  Reche Ohara PT, DPT 09/30/2024, 3:56 PM

## 2024-09-30 NOTE — Progress Notes (Signed)
 Physical Therapy Session Note  Patient Details  Name: [name] MRN: [id number] Date of Birth: [birth year]  Today's Date: 09/30/2024 PT Individual Time: 9169-9144 PT Individual Time Calculation (min): 25 min  and Today's Date: 09/30/2024 PT Missed Time: 50 Minutes Missed Time Reason: CT/MRI  Short Term Goals: Week 1:  PT Short Term Goal 1 (Week 1): Pt will perform all standing transfers with SBA. PT Short Term Goal 2 (Week 1): Pt will ambulate at least 60 ft per bout using LRAD with overall SBA demonstrating improved activity tolerance. PT Short Term Goal 3 (Week 1): Pt will initiate stair training. PT Short Term Goal 4 (Week 1): Pt will perform appropriate outcome measure.  Skilled Therapeutic Interventions/Progress Updates:      Pt seated in WC upon arrival eating breakfast upon arrival. Pt agreeable to therapy. Pt endorses 6/10 LBP, and nausea. Pt endorses chronic LBP radiating into R leg, and typically being nausea all day. Nurse present to administer pain and anti-nausea medications. PT provided rest breaks and repositioning.   While pt eating, PT Followed up on pt PLOF: pt endorses being sedentary for past 2 months 2/2 debilitating LBP, and poor appetite and weakness. Pt endorses she was able to walk to the bathroom, and on 2nd floor to complete dialysis. Pt endorses if a family member or friend wasn't available she was able to navigate 15 steps to first floor with handrails to let her dog out. Pt endorses furniture walking 2/2 not having enough room to maneuver the RW.   Nurse endorses pt need to get back in bed for transport to CT. Pt performed stand pivot transfer WC to bed with RW and CGA, pt demonstrates improved recall of UE positioning and anterior weight shift.   Pt missed 50 minutes 2/2 stat CT of pelvis. Will attempt to make up missed minutes as able.     Therapy Documentation Precautions:  Precautions Precautions: Fall Recall of Precautions/Restrictions:  Intact Precaution/Restrictions Comments: PD catheter at mid lower abdomen, triple lumen central line in R jugular, on ART for HIV(+) Restrictions Weight Bearing Restrictions Per Provider Order: No  Therapy/Group: Individual Therapy  [facility], Leon, DPT  09/30/2024, 8:29 AM

## 2024-09-30 NOTE — Progress Notes (Signed)
 PROGRESS NOTE   Subjective/Complaints:  Pt didn't get PD last night- Nursing had difficulties trying to get ahold of renal to schedule.   We are working on making it occur tonight and onward.   Nauseated this AM.  Having back and side pain- hasn't asked for meds yet.   Slept well due to lack of PD.  LBM yesterday evening.     ROS:   Pt denies SOB, abd pain, CP, N/V/C/D, and vision changes Per HPI +diarrhea,  +Back pain controlled usually   Objective:   CT HEAD WO CONTRAST ( ) Result Date: 09/29/2024 EXAM: CT HEAD WITHOUT 09/28/2024 11:54:00 PM TECHNIQUE: CT of the head was performed without the administration of intravenous contrast. Automated exposure control, iterative reconstruction, and/or weight based adjustment of the mA/kV was utilized to reduce the radiation dose to as low as reasonably achievable. COMPARISON: 07/30/2024 CLINICAL HISTORY: Head trauma, minor, normal mental status (Age 9-64y); ?trauma, hit head on door frame during transfer with family. FINDINGS: BRAIN AND VENTRICLES: No acute intracranial hemorrhage. No mass effect or midline shift. No extra-axial fluid collection. No evidence of acute infarct. No hydrocephalus. Intracranial atherosclerosis. Prior pituitary mass is less conspicuous on the current study. ORBITS: No acute abnormality. SINUSES AND MASTOIDS: No acute abnormality. SOFT TISSUES AND SKULL: No acute skull fracture. No acute soft tissue abnormality. IMPRESSION: 1. No acute intracranial abnormality. Electronically signed by: Pinkie Pebbles MD 09/29/2024 12:08 AM EST RP Workstation: [id number]   Recent Labs    09/28/24 0500 09/30/24 0402  WBC 13.2* 15.3*  HGB 8.6* 7.6*  HCT 25.5* 22.1*  PLT 233 230   Recent Labs    09/28/24 0500 09/30/24 0402  NA 135 133*  K 3.9 3.9  CL 94* 94*  CO2 23 22  GLUCOSE 110* 83  BUN 43* 57*  CREATININE 11.90* 12.56*  CALCIUM  8.5* 7.8*     Intake/Output Summary (Last 24 hours) at 09/30/2024 0819 Last data filed at 09/30/2024 0700 Gross per 24 hour  Intake 236.5 ml  Output 18 ml  Net 218.5 ml        Physical Exam: Vital Signs Blood pressure 122/68, pulse 89, temperature 98.2 F (36.8 C), temperature source Oral, resp. rate 16, height 5' 5 (1.651 m), weight 78.1 kg, last menstrual period 03/31/2014, SpO2 99%.    General: awake, alert, appropriate, sitting at sink doing grooming;  NAD HENT: conjugate gaze; oropharynx  a little dry CV: regular rate and rhythm; no JVD Pulmonary: CTA B/L; no W/R/R- good air movement- sounds good GI: soft, NT, ND, (+)BS- mildly TTP- no rebound- more pain in R side- more laterally if anything; PD catheter seen Psychiatric: appropriate, interactive Neurological: Ox3 Skin- has a CVL in R neck- looks OK  Skin: PD site clean, dressed. A few scattered bruises Neuro:   Alert and awake. Normal insight and awareness. Normal language and speech. Cranial nerve exam unremarkable. Moving all 4 extremities to gravity and resistance. Stocking glove sensory loss below ankles and to a lesser extent in both hands.  No abnl resting tone.    Assessment/Plan: 1. Functional deficits which require 3+ hours per day of interdisciplinary therapy in a comprehensive inpatient rehab setting. Physiatrist  is providing close team supervision and 24 hour management of active medical problems listed below. Physiatrist and rehab team continue to assess barriers to discharge/monitor patient progress toward functional and medical goals  Care Tool:  Bathing    Body parts bathed by patient: Right arm, Left arm, Chest, Abdomen, Front perineal area, Buttocks, Right upper leg, Left upper leg, Face   Body parts bathed by helper: Left lower leg, Right lower leg     Bathing assist Assist Level: Minimal Assistance - Patient > 75%     Upper Body Dressing/Undressing Upper body dressing   What is the patient  wearing?: Pull over shirt (simulated using theraband)    Upper body assist Assist Level: Supervision/Verbal cueing    Lower Body Dressing/Undressing Lower body dressing      What is the patient wearing?: Pants (simulated using theraband)     Lower body assist Assist for lower body dressing: Minimal Assistance - Patient > 75%     Toileting Toileting    Toileting assist Assist for toileting: Minimal Assistance - Patient > 75%     Transfers Chair/bed transfer  Transfers assist     Chair/bed transfer assist level: Minimal Assistance - Patient > 75%     Locomotion Ambulation   Ambulation assist      Assist level: Minimal Assistance - Patient > 75% Assistive device: Hand held assist Max distance: 20 ft   Walk 10 feet activity   Assist     Assist level: Contact Guard/Touching assist Assistive device: Walker-rolling   Walk 50 feet activity   Assist Walk 50 feet with 2 turns activity did not occur: Safety/medical concerns         Walk 150 feet activity   Assist Walk 150 feet activity did not occur: Safety/medical concerns         Walk 10 feet on uneven surface  activity   Assist Walk 10 feet on uneven surfaces activity did not occur: Safety/medical concerns         Wheelchair     Assist Is the patient using a wheelchair?: Yes (may not require by d/c for use at home) Type of Wheelchair: Manual    Wheelchair assist level: Dependent - Patient 0% Max wheelchair distance: 300 ft    Wheelchair 50 feet with 2 turns activity    Assist        Assist Level: Dependent - Patient 0%   Wheelchair 150 feet activity     Assist      Assist Level: Dependent - Patient 0%   Blood pressure 122/68, pulse 89, temperature 98.2 F (36.8 C), temperature source Oral, resp. rate 16, height 5' 5 (1.651 m), weight 78.1 kg, last menstrual period 03/31/2014, SpO2 99%.   Medical Problem List and Plan: 1. Functional deficits secondary to  debility related to circulatory shock, adrenal insufficiency/pituitary apoplexy, new CHF             -patient may  shower             -ELOS/Goals: 7-10 days, mod I goals.              -family is looking at some options for intermittent assistance at home  Con't CIR PT and OT  - Head CT completed last night 12/13 when she bumped her head, no acute abnormalities   D/w nursing to get pt back on PD 2.  Antithrombotics: -DVT/anticoagulation:  Mechanical: Sequential compression devices, below knee Bilateral lower extremities Pharmaceutical: Heparin              -  antiplatelet therapy: Aspirin     3. Pain Management:  Tylenol  and Oxycodone  prn.   4. Mood/Behavior/Sleep: LCSW to follow for evaluation and support when available.              -antipsychotic agents: Buspar  10 mg BID prn for anxiety and Remeron  7.5 mg at bedtime.              -pt seems positive. Family very supportive 5. Neuropsych/cognition: This patient is capable of making decisions on her own behalf.   6. Skin/Wound Care: Routine pressure relief measures.    7. Fluids/Electrolytes/Nutrition: Monitor I&O and weight. Follow up labs CBC/CMP               -GERD: Omeprazole  40 mg bid    8. 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    9.ESRD: Continue nightly PD per nephrology.              -pt was independent with management of her PD at home  -12/14, discussed Renvela  with nephrology, they plan to resume  10.Hx of papillary thyroid  cancer s/p thyroidectomy: TSH normal, continue Synthroid .    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    12. HFrEF, without acute exacerbation: Nephrology to manage volume status. Cardiology following. Continue heart healthy diet and GDMT as tolerated. Daily weights-appears stable  12/15- no weight today Filed Weights   09/28/24 0635 09/29/24 0628  Weight: 77.9 kg 78.1 kg       13. Low back pain/Abdominal pain: present since aortic dissection 11/24. Continue to monitor. MRI T and L spine negative, RUQ US  negative for acute findings.              -continue Lidocaine  patch              -pt is able to work through discomfort  -12/14 had some oxycodone  yesterday for her back pain but doing better today  12/15- might need to get CT of abd/pelvis- will d/w team 14. HTN: Monitor BP per protocol, continue Metoprolol    - Controlled      09/30/2024    5:56 AM 09/29/2024    7:14 PM 09/29/2024    1:33 PM  Vitals with BMI  Systolic 122 126 862  Diastolic 68 73 73  Pulse 89 88 88    15. HLD: Crestor  10 mg    16. HIV: CD4 445 on 10/15. Continue home [name]  and Descovy                17. T2DM: Monitor glucose ac/hs with SSI.              -associated peripheral neuropathy  -12/14 had one high value yesterday 211 but overall controlled continue current regimen   -CBG (last 3)  Recent Labs    09/29/24 1644 09/29/24 2025 09/30/24 0558  GLUCAP 110* 97 85   18. Chronic nausea/poor appetite  -12/13 Ate 50%, continue monitor intake  -Continue Remaron  19. Diarrhea  -12/13 LBM recorded yesterday type 4, medium, continue to monitor   -pt interested in trying probiotics, will start 20. Leukocytosis  12/15- WBC 15.3 up from 13k -will check if voids at all- and order U/A and Cx if so and check CXR- will also order labs for AM- and  might need to remove CVL in R neck-  21. Acute on chronic anemia  12/15- Due to ESRD- but also dropped almost  1 unit since Saturday- which is 1 unit lower than average last 1 week. Will check CT of abd/pelvis and hemoccult- also reached out to Renal about this.    I spent a total of 58   minutes on total care today- >50% coordination of care- due to  D/w team about PD; with renal and NP about dropping Hb and elevating WBC- and ordered CT, waiting ot hear from nursing if she voids at all- ordered CXR and ordered more labs. And IPOC  LOS: 3  days A FACE TO FACE EVALUATION WAS PERFORMED  [name] 09/30/2024, 8:19 AM

## 2024-09-30 NOTE — Progress Notes (Signed)
 [facility] Individual Statement of Services  Patient Name:  [name]  Date:  09/30/2024  Welcome to the [facility]patient [facility].  Our goal is to provide you with an individualized program based on your diagnosis and situation, designed to meet your specific needs.  With this comprehensive rehabilitation program, you will be expected to participate in at least 3 hours of rehabilitation therapies Monday-Friday, with modified therapy programming on the weekends.  Your rehabilitation program will include the following services:  Physical Therapy (PT), Occupational Therapy (OT), 24 hour per day rehabilitation nursing, Therapeutic Recreaction (TR), Neuropsychology, Care Coordinator, Rehabilitation Medicine, Nutrition Services, and Pharmacy Services  Weekly team conferences will be held on Tuesday to discuss your progress.  Your Inpatient Rehabilitation Care Coordinator will talk with you frequently to get your input and to update you on team discussions.  Team conferences with you and your family in attendance may also be held.  Expected length of stay: 10-14 days  Overall anticipated outcome: supervision/mod/I level  Depending on your progress and recovery, your program may change. Your Inpatient Rehabilitation Care Coordinator will coordinate services and will keep you informed of any changes. Your Inpatient Rehabilitation Care Coordinator's name and contact numbers are listed  below.  The following services may also be recommended but are not provided by the Inpatient [facility] Center:  Driving Evaluations Home Health Rehabiltiation Services Outpatient Rehabilitation Services    Arrangements will be made to provide these services after discharge if needed.  Arrangements include referral to agencies that provide these services.  Your insurance has been verified to be:  [employer] Your primary doctor is:  comer Gaskins  Pertinent information  will be shared with your doctor and your insurance company.  Inpatient Rehabilitation Care Coordinator:  [name], [name]  Information discussed with and copy given to patient by: [name], 09/30/2024, 9:07 AM

## 2024-09-30 NOTE — Progress Notes (Signed)
 Inpatient Rehabilitation Care Coordinator Assessment and Plan Patient Details  Name: [name] MRN: [id number] Date of Birth: [birth year]  Today's Date: 09/30/2024  Hospital Problems: Principal Problem:   Debility  Past Medical History:  Past Medical History:  Diagnosis Date   Acute pain of right shoulder due to trauma 06/08/2023   Acute pancreatitis 10/23/2021   Acute renal failure superimposed on stage 4 chronic kidney disease (HCC) 10/24/2021   Allergy    Anemia    Normocytic   Antibiotic-induced yeast infection 09/28/2020   Anxiety    Asthma    Bronchitis 2005   Bursitis of left shoulder 09/06/2019   CKD (chronic kidney disease)    Dialysis on Mon-Wed- Fri   CLASS 1-EXOPHTHALMOS-THYROTOXIC 02/08/2007   Cognitive dysfunction 02/21/2020   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  Genes Analyzed (43 total): APC*, ATM*, AXIN2, BARD1, BMPR1A, BRCA1*, BRCA2*, BRIP1*, CDH1*, CDK4, CDKN2A, CHEK2*, DICER1, GALNT12, HOXB13, MEN1, MLH1*, MRE11A, MSH2*, MSH3, MSH6*, MUTYH*, NBN, NF1*, NTHL1, PALB2*, PMS2*, POLD1, POLE, PTEN*, RAD50, RAD51C*, RAD51D*, RET, SDHB, SDHD, SMAD4, SMARCA4, STK11 and TP53* (sequencing and deletion/duplication); EGFR (s   GERD 07/24/2006   GRAVE'S DISEASE 01/01/2008   History of hidradenitis suppurativa    History of kidney stones    History of thrush    HIV DISEASE  07/24/2006   dx March 05   Hyperlipidemia    HYPERTENSION 07/24/2006   Hyperthyroidism 08/2006   Grave's Disease -diffuse radiotracer uptake 08/25/06 Thyroid  scan-Cold nodule to R lower lobe of thyrorid   Ileus (HCC) 02/14/2023   Left bundle branch block (LBBB)    Malnutrition of moderate degree 02/08/2023   Menometrorrhagia    hx of   Nephrolithiasis    Panniculitis 05/12/2020   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 Completed 2 yrs Prednisone  after lung bx confirmation. No symptoms since then.   Sebaceous cyst 04/21/2020   Suppurative hidradenitis    Thyroid  cancer (HCC)    THYROID  NODULE, RIGHT 02/08/2007   Past Surgical History:  Past Surgical History:  Procedure Laterality Date   AORTIC ARCH DEBRANCHING N/A 08/25/2023   Procedure: AORTIC ARCH DEBRANCHING USING 12X8X8MM HEMASHIELD GRAFT;  Surgeon: Lucas Dorise POUR, MD;  Location: [facility];  Service: Open Heart Surgery;  Laterality: N/A;  with bypasses to the innominate and left common  carotid arteries   APPLICATION OF WOUND VAC N/A 01/20/2021   Procedure: APPLICATION OF WOUND VAC;  Surgeon: Lowery Estefana RAMAN, DO;  Location: [facility];  Service: Plastics;  Laterality: N/A;   BREAST EXCISIONAL BIOPSY Right 04/26/2018   right axilla negative   BREAST EXCISIONAL BIOPSY Left 04/26/2018   left axilla negative   BREAST LUMPECTOMY Right 10/03/2018    malignant   BREAST LUMPECTOMY WITH RADIOACTIVE SEED AND SENTINEL LYMPH NODE BIOPSY Right 10/03/2018   Procedure: RIGHT BREAST LUMPECTOMY WITH RADIOACTIVE SEED AND SENTINEL LYMPH NODE MAPPING;  Surgeon: Vanderbilt Ned, MD;  Location: [facility];  Service: General;  Laterality: Right;   BREAST SURGERY  1997   Breast Reduction    CAPD INSERTION N/A 12/21/2023   Procedure: LAPAROSCOPIC INSERTION CONTINUOUS AMBULATORY PERITONEAL DIALYSIS  (CAPD) CATHETER; LAPAROSCOPIC OMENTUMPEXY;  Surgeon: Magda Ned SAILOR, MD;  Location: [facility];  Service: Vascular;  Laterality: N/A;   CYSTOSCOPY W/ URETERAL STENT REMOVAL  11/09/2012   Procedure: CYSTOSCOPY WITH STENT REMOVAL;  Surgeon: Ricardo Likens, MD;  Location: [facility];  Service: Urology;  Laterality: Right;   CYSTOSCOPY WITH RETROGRADE PYELOGRAM, URETEROSCOPY AND STENT PLACEMENT  11/09/2012   Procedure: CYSTOSCOPY WITH RETROGRADE PYELOGRAM, URETEROSCOPY AND STENT PLACEMENT;  Surgeon: Ricardo Likens, MD;  Location: [facility];  Service: Urology;  Laterality: Left;  LEFT URETEROSCOPY, STONE MANIPULATION, left STENT exchange    CYSTOSCOPY WITH STENT PLACEMENT  10/02/2012   Procedure: CYSTOSCOPY WITH STENT PLACEMENT;  Surgeon: Ricardo Likens, MD;  Location: [facility];  Service: Urology;  Laterality: Left;   DEBRIDEMENT AND CLOSURE WOUND N/A 01/20/2021   Procedure: Excision of abdominal wound with closure;  Surgeon: Lowery Estefana RAMAN, DO;  Location: [facility];  Service: Plastics;  Laterality: N/A;   DILATION AND CURETTAGE OF UTERUS  11/2002   s/p for 1st trimester nonviable pregnancy   ESOPHAGOGASTRODUODENOSCOPY N/A 09/11/2024   Procedure: EGD (ESOPHAGOGASTRODUODENOSCOPY);  Surgeon: Abran Norleen SAILOR, MD;  Location: [facility];  Service: Gastroenterology;  Laterality: N/A;   EYE SURGERY     sty under eyelid   INCISE AND DRAIN ABCESS  08/2002   s/p I &D for righ inframmary fold hidradenitis   INCISION AND DRAINAGE PERITONSILLAR ABSCESS  12/2001   IR CV  LINE INJECTION  06/07/2018   IR FLUORO GUIDE CV LINE RIGHT  01/30/2023   IR IMAGING GUIDED PORT INSERTION  06/20/2018   IR REMOVAL TUN ACCESS W/ PORT W/O FL MOD SED  06/20/2018   IR US  GUIDE VASC ACCESS RIGHT  01/30/2023   IRRIGATION AND DEBRIDEMENT ABSCESS  01/31/2012   Procedure: IRRIGATION AND DEBRIDEMENT ABSCESS;  Surgeon: Alm VEAR Angle, MD;  Location: [facility];  Service: General;  Laterality: Right;  right breast and axilla    LAPAROSCOPIC REPOSITIONING CAPD CATHETER Left 01/10/2024   Procedure: Removal of peritoneal dialysis Catheter.;  Surgeon: Magda Ned SAILOR, MD;  Location: [facility] OR;  Service: Vascular;  Laterality: Left;   LAPAROSCOPY N/A 01/10/2024   Procedure: LAPAROSCOPY, DIAGNOSTIC;  Surgeon: Magda Ned SAILOR, MD;  Location: [facility] OR;  Service: Vascular;  Laterality: N/A;   NEPHROLITHOTOMY  10/02/2012   Procedure: NEPHROLITHOTOMY PERCUTANEOUS;  Surgeon: Ricardo Likens, MD;  Location: [facility];  Service: Urology;  Laterality: Right;  First Stage Percutaneous Nephrolithotomy with Surgeon Access, Left Ureteral Stent     NEPHROLITHOTOMY  10/04/2012   Procedure: NEPHROLITHOTOMY PERCUTANEOUS SECOND LOOK;  Surgeon: Ricardo Likens, MD;  [facility]: [facility];  Service: Urology;  Laterality: Right;  NEPHROLITHOTOMY  10/08/2012   Procedure: NEPHROLITHOTOMY PERCUTANEOUS;  Surgeon: Ricardo Likens, MD;  Location: [facility];  Service: Urology;  Laterality: Right;  THIRD STAGE, nephrostomy tube exchange x 2   NEPHROLITHOTOMY  10/11/2012   Procedure: NEPHROLITHOTOMY PERCUTANEOUS SECOND LOOK;  Surgeon: Ricardo Likens, MD;  Location: [facility];  Service: Urology;  Laterality: Right;  RIGHT 4 STAGE PERCUTANOUS NEPHROLITHOTOMY, right URETEROSCOPY WITH HOLMIUM LASER    PANNICULECTOMY N/A 12/21/2020   Procedure: PANNICULECTOMY;  Surgeon: Lowery Estefana RAMAN, DO;  [facility]: [facility];  Service: Plastics;  Laterality: N/A;   PERCUTANEOUS NEPHROSTOLITHOTOMY  04/2022   PORT-A-CATH REMOVAL N/A 07/16/2020   Procedure: REMOVAL  PORT-A-CATH;  Surgeon: Vanderbilt Ned, MD;  Location: [facility];  Service: General;  Laterality: N/A;   PORTACATH PLACEMENT Left 05/17/2018   Procedure: INSERTION PORT-A-CATH;  Surgeon: Vernetta Berg, MD;  Location: [facility];  Service: General;  Laterality: Left;   RADICAL NECK DISSECTION  04/12/2017   limited/notes 04/12/2017   RADICAL NECK DISSECTION N/A 04/12/2017   Procedure: RADICAL NECK DISSECTION;  Surgeon: Carlie Clark, MD;  Location[facility] OR;  Service: ENT;  Laterality: N/A;  limited neck dissection 2 hours total   REDUCTION MAMMAPLASTY Bilateral 1998   RIGHT/LEFT HEART CATH AND CORONARY ANGIOGRAPHY N/A 03/12/2020   Procedure: RIGHT/LEFT HEART CATH AND CORONARY ANGIOGRAPHY;  Surgeon: Cherrie Toribio SAUNDERS, MD;  [facility];  Service: Cardiovascular;  Laterality: N/A;   RIGHT/LEFT HEART CATH AND CORONARY ANGIOGRAPHY N/A 09/26/2024   Procedure: RIGHT/LEFT HEART CATH AND CORONARY ANGIOGRAPHY;  Surgeon: Darron Deatrice LABOR, MD;  [facility];  Service: Cardiovascular;  Laterality: N/A;   [place]  1994   TEE WITHOUT CARDIOVERSION N/A 08/25/2023   Procedure: TRANSESOPHAGEAL ECHOCARDIOGRAM;  Surgeon: Lucas Dorise POUR, MD;  [facility];  Service: Open Heart Surgery;  Laterality: N/A;   TENCKHOFF CATHETER INSERTION Left 01/10/2024   Procedure: INSERTION, CATHETER, DIALYSIS, PERITONEAL;  Surgeon: Magda Ned SAILOR, MD;  [facility];  Service: Vascular;  Laterality: Left;   THORACIC AORTIC ENDOVASCULAR STENT GRAFT N/A 02/02/2023   Procedure: THORACIC AORTIC ENDOVASCULAR STENT GRAFT;  Surgeon: Lanis Fonda BRAVO, MD;  Location: [facility] OR;  Service: Vascular;  Laterality: N/A;   THORACIC AORTIC ENDOVASCULAR STENT GRAFT N/A 08/25/2023   Procedure: THORACIC AORTIC ENDOVASCULAR STENT GRAFT;  Surgeon: Lanis Fonda BRAVO, MD;  Location: [facility] OR;  Service: Vascular;  Laterality: N/A;   THYROIDECTOMY  04/12/2017   completion/notes 04/12/2017    THYROIDECTOMY N/A 04/12/2017   Procedure: THYROIDECTOMY;  Surgeon: Carlie Clark, MD;  [facility];  Service: ENT;  Laterality: N/A;  Completion Thyroidectomy   TOTAL THYROIDECTOMY  2010   ULTRASOUND GUIDANCE FOR VASCULAR ACCESS Right 08/25/2023   Procedure: ULTRASOUND GUIDANCE FOR VASCULAR ACCESS, RIGHT FEMORAL ARTERY;  Surgeon: Lanis Fonda BRAVO, MD;  Location: [facility] OR;  Service: Vascular;  Laterality: Right;   Social History:  reports that she quit smoking about 6 years ago. Her smoking use included cigarettes. She started smoking about 7 years ago. She has a 7.5 pack-year smoking history. She has never used smokeless tobacco. She reports that she does not currently use alcohol . She reports that she does not use drugs.  Family / Support Systems Marital Status: Single Patient Roles: Parent, Other (Comment) (sibling) Children: Deandra-daughter [phone] Other Supports: Share-sister [phone]  Nocita-sister [phone]  Lorelee [phone] Anticipated Caregiver: Share Ability/Limitations of Caregiver: Family has rotating coming in in every other day to assist due to slow decline in function  Caregiver Availability: Intermittent Family Dynamics: Close with family and many are local and can come in but all work except Dad and can only provide intermittent assist due to other obligations  Social History Preferred language: English Religion: Baptist Cultural Background: NA Education: HS Health Literacy - How often do you need to have someone help you when you read instructions, pamphlets, or other written material from your doctor or pharmacy?: Never Writes: Yes 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 Guardian/Conservator: None-according to MD pt is capable of making her own decisions while here   Abuse/Neglect Abuse/Neglect Assessment Can Be Completed: Yes Physical Abuse: Denies Verbal Abuse: Denies Sexual Abuse: Denies Exploitation of patient/patient's  resources: Denies Self-Neglect: Denies  Patient response to: Social Isolation - How often do you feel lonely or isolated from those around you?: Never  Emotional Status Pt's affect, behavior and adjustment status: Pt is motivated to recover and has been declining for a few months, she is hopeful she will do well and get stronger while here. She was independent prior to two months ago when declined started Recent Psychosocial Issues: other health issues Psychiatric History: Hx-depression and anxiety takes medications but have become worse due to declining health. Will ask neuro-psych to see while here Substance Abuse History: NA  Patient / Family Perceptions, Expectations & Goals Pt/Family understanding of illness & functional limitations: Pt is able to explain her health issues and reason for hospitalization she is hopeful she will do well here and nmake progress toward beocming independent again. She does talk with the MD's involved of her care Premorbid pt/family roles/activities: mom, sister, friend, daughter, retiree Anticipated changes in roles/activities/participation: resume Pt/family expectations/goals: Pt states:  I hope to get stronger and be able to do for myself when I leave here.  Community Resources [employer]: Other (Comment) (PD at home) Premorbid Home Care/DME Agencies: Other (Comment) (rw) Transportation available at discharge: family Is the patient able to respond to transportation needs?: Yes In the past 12 months, has lack of transportation kept you from medical appointments or from getting medications?: No In the past 12 months, has lack of transportation kept you from meetings, work, or from getting things needed for daily living?: No Resource referrals recommended: Neuropsychology  Discharge Planning Living Arrangements: Alone Support Systems: Children, Parent, Other relatives, Friends/neighbors Type of Residence: Private residence Insurance Resources:  [employer] (specify) ([employer] Medicare) Financial Resources: SSD Financial Screen Referred: No Living Expenses: Rent Money Management: Patient Does the patient have any problems obtaining your medications?: No Home Management: family had to once started to decline Patient/Family Preliminary Plans: Return home with family coming in intermittently due to all work but are involved and supportive. Will work on discharge needs and have placed on neuro-psych list to be seen while here Care Coordinator Barriers to Discharge: Decreased caregiver support, Insurance for SNF coverage, Hemodialysis Care Coordinator Anticipated Follow Up Needs: HH/OP  Clinical Impression Pleasant female who is motivated to do well here and regain her strength and independence before leaving. She has goals of supervision but will only have intermittent assist from family. Will see if can upgrade her goals while here. Will work on discharge needs.   Raymonde Asberry MATSU 09/30/2024, 9:05 AM

## 2024-09-30 NOTE — Progress Notes (Signed)
 Patient alert and oriented x's 4, Peritoneal dialysis treatment initiated without difficulty.   09/30/24 1915  Peritoneal Catheter Mid lower abdomen  Placement Date/Time: 01/10/24 1213   Serial / Lot #: [id number]  Expiration Date: 04/15/28  Procedural Verification: Medical records & consent reviewed;Relevant studies,results and images reviewed;Site marked with initials  Time out: Correct Patient;Corre...  Site Assessment Clean, Dry, Intact  Drainage Description None  Catheter status Accessed;Patent  Dressing Gauze/Drain sponge  Dressing Status Clean, Dry, Intact  Dressing Intervention New dressing/dressing changed  Cycler Setup  Total Number of Night Cycles 5  Night Fill Volume 2250  Dianeal  Solution Dextrose  1.5% in 6000 mL Low Cal/Low Mag (2.5 in 6000)  Night Dwell Time per Cycle - Hour(s) 1  Night Dwell Time per Cycle - Minute(s) 30  Night Time Therapy - Minute(s) 12  Night Time Therapy - Hour(s) 10  Maximum Peritoneal Volume 3375  Night/Total Therapy Volume 88749  Day Exchange No  Completion  Treatment Status Started  Hand-off documentation  Hand-off Received Received from shift RN/LPN  Report received from (Full Name) Le[place], [place]  H, LPN

## 2024-09-30 NOTE — Progress Notes (Signed)
 [facility] Progress Note   Subjective:    Seen and examined patient at bedside. Discussed with primary team. Hgbs are dropping and WBCs going up. CT and hemoccult already ordered by primary. Blood cultures added as well. Plan for PD tonight.  Objective Vitals:   09/29/24 1333 09/29/24 1914 09/30/24 0556 09/30/24 0853  BP: 137/73 126/73 122/68   Pulse: 88 88 89   Resp: 18 16 16    Temp: 98.4 F (36.9 C) 98.4 F (36.9 C) 98.2 F (36.8 C)   TempSrc: Oral Oral Oral   SpO2: 100% 100% 99% 100%  Weight:      Height:       Physical Exam General: alert female in NAD Heart: RRR, no murmurs Lungs: CTA bilaterally, respirations unlabored Abdomen: Soft, non-distended, +BS Extremities: No edema b/l lower extremities Dialysis Access:  PD cath hooked up to cycler  Filed Weights   09/28/24 0635 09/29/24 0628  Weight: 77.9 kg 78.1 kg    Intake/Output Summary (Last 24 hours) at 09/30/2024 1258 Last data filed at 09/30/2024 0700 Gross per 24 hour  Intake 236.5 ml  Output --  Net 236.5 ml    Additional Objective Labs: Basic Metabolic Panel: Recent Labs  Lab 09/27/24 0356 09/28/24 0500 09/29/24 0434 09/30/24 0402  NA 131* 135  --  133*  K 3.9 3.9  --  3.9  CL 94* 94*  --  94*  CO2 20* 23  --  22  GLUCOSE 143* 110*  --  83  BUN 38* 43*  --  57*  CREATININE 11.00* 11.90*  --  12.56*  CALCIUM  8.3* 8.5*  --  7.8*  PHOS 5.7* 7.1* 7.0* 8.4*   Liver Function Tests: Recent Labs  Lab 09/23/24 1431 09/28/24 0500  AST 19 34  ALT 9 20  ALKPHOS 244* 374*  BILITOT 1.0 1.0  PROT 6.6 6.0*  ALBUMIN  1.9* 2.7*   Recent Labs  Lab 09/23/24 1839  LIPASE 30  AMYLASE 48   CBC: Recent Labs  Lab 09/23/24 1431 09/24/24 0354 09/25/24 0537 09/26/24 0532 09/26/24 1454 09/27/24 0356 09/28/24 0500 09/30/24 0402  WBC 7.8   < > 11.7* 16.1*  --  12.9* 13.2* 15.3*  NEUTROABS 5.1  --   --   --   --   --  11.9*  --   HGB 11.8*   < > 9.1* 9.8*   < > 9.4* 8.6* 7.6*   HCT 36.9   < > 27.5* 28.8*   < > 27.8* 25.5* 22.1*  MCV 111.1*   < > 108.7* 108.7*  --  108.2* 106.3* 105.7*  PLT 214   < > 180 226  --  247 233 230   < > = values in this interval not displayed.   Blood Culture    Component Value Date/Time   SDES PERITONEAL DIALYSATE 09/24/2024 0700   SPECREQUEST Immunocompromised ABDOMINAL 09/24/2024 0700   CULT  09/24/2024 0700    NO GROWTH 3 DAYS Performed at [facility], [address] [place], [place] [address]    REPTSTATUS 09/27/2024 FINAL 09/24/2024 0700    Cardiac Enzymes: No results for input(s): CKTOTAL, CKMB, CKMBINDEX, TROPONINI in the last 168 hours. CBG: Recent Labs  Lab 09/29/24 1153 09/29/24 1644 09/29/24 2025 09/30/24 0558 09/30/24 1109  GLUCAP 118* 110* 97 85 103*   Iron  Studies: No results for input(s): IRON , TIBC, TRANSFERRIN, FERRITIN in the last 72 hours. Lab Results  Component Value Date   INR 1.1 09/23/2024  INR 1.4 (H) 08/29/2023   INR 1.4 (H) 08/28/2023   Studies/Results: CT ABDOMEN PELVIS WO CONTRAST Result Date: 09/30/2024 CLINICAL DATA:  Anemia, history of aortic dissection. End-stage renal disease. EXAM: CT ABDOMEN AND PELVIS WITHOUT CONTRAST TECHNIQUE: Multidetector CT imaging of the abdomen and pelvis was performed following the standard protocol without IV contrast. RADIATION DOSE REDUCTION: This exam was performed according to the departmental dose-optimization program which includes automated exposure control, adjustment of the mA and/or kV according to patient size and/or use of iterative reconstruction technique. COMPARISON:  CT abdomen pelvis 09/07/2024. FINDINGS: Lower chest: Bibasilar scarring and volume loss. Partially imaged endovascular stent graft in the lower descending thoracic aorta, extending to the diaphragmatic hiatus. Descending thoracic aorta measures up to approximately 5.1 cm. Heart is enlarged. Age advanced right coronary artery calcification. No pericardial or  pleural effusion. Distal esophagus is grossly unremarkable. Hepatobiliary: Liver margin is slightly irregular and the liver is enlarged, 18.2 cm. Noncalcified gallstones. Tiny perihepatic ascites. No biliary ductal dilatation. Pancreas: Negative. Spleen: Negative. Adrenals/Urinary Tract: Right adrenal gland is unremarkable. Left adrenal adenoma. No specific follow-up necessary. Kidneys are mildly atrophic. Excreted contrast in the right renal collecting system related to CT angiography 09/23/2024. Bilateral renal stones and/or renal vascular calcifications. Ureters are decompressed. Contrast is seen in a low volume bladder. Stomach/Bowel: Stomach, small bowel, appendix and colon are unremarkable. Vascular/Lymphatic: Atherosclerotic calcification of the aorta. No pathologically enlarged lymph nodes. Calcified dissection flap in the aorta. Further assessment is limited without IV contrast. Reproductive: No adnexal mass. Other: Small pelvic free fluid. Percutaneous dialysis catheter terminates in the anatomic pelvis. Small umbilical hernia contains fat. Small bilateral inguinal hernias contain fat. Musculoskeletal: Degenerative changes in the spine. IMPRESSION: 1. No acute findings. 2. Age advanced right coronary artery calcification. 3. Cirrhosis. 4. Cholelithiasis. 5. Minimal ascites with peritoneal dialysis catheter in place. 6. Left adrenal adenoma. 7. Bilateral renal stones and/or renal vascular calcifications. 8. Partially visualized endovascular stent graft in an aneurysmal descending thoracic aorta, with a chronic dissection flap in the abdominal aorta. 9.  Aortic atherosclerosis (ICD10-I70.0). Electronically Signed   By: Newell Eke M.D.   On: 09/30/2024 10:44   CT HEAD WO CONTRAST ( ) Result Date: 09/29/2024 EXAM: CT HEAD WITHOUT 09/28/2024 11:54:00 PM TECHNIQUE: CT of the head was performed without the administration of intravenous contrast. Automated exposure control, iterative reconstruction,  and/or weight based adjustment of the mA/kV was utilized to reduce the radiation dose to as low as reasonably achievable. COMPARISON: 07/30/2024 CLINICAL HISTORY: Head trauma, minor, normal mental status (Age 70-64y); ?trauma, hit head on door frame during transfer with family. FINDINGS: BRAIN AND VENTRICLES: No acute intracranial hemorrhage. No mass effect or midline shift. No extra-axial fluid collection. No evidence of acute infarct. No hydrocephalus. Intracranial atherosclerosis. Prior pituitary mass is less conspicuous on the current study. ORBITS: No acute abnormality. SINUSES AND MASTOIDS: No acute abnormality. SOFT TISSUES AND SKULL: No acute skull fracture. No acute soft tissue abnormality. IMPRESSION: 1. No acute intracranial abnormality. Electronically signed by: Pinkie Pebbles MD 09/29/2024 12:08 AM EST RP Workstation: [id number]    Medications:  dialysis solution 1.5% low-MG/low-CA      allopurinol   100 mg Oral Daily   aspirin   81 mg Oral Daily   budesonide  (PULMICORT ) nebulizer solution  0.25 mg Nebulization BID   calcitRIOL   0.25 mcg Oral Once per day on Monday Wednesday Friday   Chlorhexidine  Gluconate Cloth  6 each Topical Q12H   dolutegravir   50 mg Oral Daily   emtricitabine -tenofovir   AF  1 tablet Oral Daily   feeding supplement  237 mL Oral BID BM   fludrocortisone   0.1 mg Oral Daily   gentamicin  cream  1 Application Topical Daily   heparin  injection (subcutaneous)  5,000 Units Subcutaneous Q8H   hydrocortisone  sod succinate (SOLU-CORTEF ) inj  50 mg Intravenous Q12H   insulin  aspart  0-5 Units Subcutaneous QHS   insulin  aspart  0-6 Units Subcutaneous TID WC   levothyroxine   150 mcg Oral Q0600   lidocaine   1 patch Transdermal Q24H   loratadine   10 mg Oral QHS   metoprolol  succinate  12.5 mg Oral Daily   mirtazapine   7.5 mg Oral QHS   multivitamin  1 tablet Oral QHS   pantoprazole   40 mg Oral Daily   rosuvastatin   10 mg Oral Daily   saccharomyces boulardii  250 mg Oral  Daily   sevelamer  carbonate  800 mg Oral TID WC    Dialysis Orders: CCPD 7x week-[facility] 5 exchanges, fill vol 2250, 1.5hr dwell time, edw 74.5kg  Usually does 1/2 1.5% bags and 1/2 2.5% bags   CXR 12/08: no active disease  Assessment/Plan: Leukocytosis: Discussed with the primary team: WBCs are going up. 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 BP's better. Now in CIR.  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 Cardiology consulting, w/ sig WMA's they are planning RHC/ LHC. Appreciate assitance.  Euvolemic on exam today, but she reported feeling full today. Managing volume with dialysis.  ESRD: on CCPD. Plan for PD tonight. I requested the bedside RN to obtain a standing weight to determine the PD bags to use. Volume: no signs of vol overload, admit CXR was negative. Plan to use 1/2 1.5% bags and 1/2 2.5% bags.  Anemia of esrd: Hgbs usually 10-11s in outpatient but now dropping-Hgb now 7.6. Discussed with primary who already ordered hemoccult. Plan to start ESA here. Transfuse for Hgb < 7. Tremors: discontinued gabapentin  and this is a cause of tremors/ asterixis in ESRD pts. Improved Secondary hyperparathyroidism: Calcium  contorlled, phos elevated. Continue calcitriol . Resumed renvela .   Charmaine Piety, NP [facility] 09/30/2024,12:58 PM  LOS: 3 days

## 2024-09-30 NOTE — Progress Notes (Signed)
 Occupational Therapy Session Note  Patient Details  Name: [name] MRN: [id number] Date of Birth: [birth year]  Today's Date: 09/30/2024 OT Individual Time: 0700-0800 OT Individual Time Calculation (min): 60 min    Short Term Goals: Week 1:  OT Short Term Goal 1 (Week 1): The pt will safely complete LB bathing and dressing with Min/ ModI using AE as needed. OT Short Term Goal 2 (Week 1): The pt will safely complete toileting with Min/ ModI using AE as needed. OT Short Term Goal 3 (Week 1): The pt will complete safely complete tub/shower transfers using AE with Min/ ModI after demonstration and initial cues. OT Short Term Goal 4 (Week 1): The pt will safely complete  OT therapy 85% of the time with not more than 3 rest breaks. OT Short Term Goal 5 (Week 1): The pt will safely ambulate for function using a  RW with closeS and additional time.  Skilled Therapeutic Interventions/Progress Updates:    Skilled OT intervention with focus on bed mobiity, functional transfers, standing balance, bathing/dressing w/c level at sink, and activity tolerance to increase independence with bADLs. Pt required CGA for all bathing/dressing tasks except min A for donning pullover shirt (fatigue.) Reveiwed energy conservation strategies. Pt required multiple rest breaks during bathing/dressing tasks. Demonstrated use of TTB for use at home. Pt returned to room and reamined in w/c with all needs within reach.   Therapy Documentation Precautions:  Precautions Precautions: Fall Recall of Precautions/Restrictions: Intact Precaution/Restrictions Comments: PD catheter at mid lower abdomen, triple lumen central line in R jugular, on ART for HIV(+) Restrictions Weight Bearing Restrictions Per Provider Order: No Pain: Pt c/o 6/10 back pain; meds admin after therapy session, repositioned  Therapy/Group: Individual Therapy  Maritza Debby Mare 09/30/2024, 11:07 AM

## 2024-09-30 NOTE — Progress Notes (Signed)
 Met with patient to review current situation, team conference and plan of care. Reviewed medications, peritoneal dialysis, internal jugular access. Continue to follow along to provide educational needs to facilitate preparation for discharge.

## 2024-09-30 NOTE — IPOC Note (Signed)
 Overall Plan of Care [facility]) Patient Details Name: [name] MRN: [id number] DOB: Feb 29, 1966  Admitting Diagnosis: Debility  Hospital Problems: Principal Problem:   Debility     Functional Problem List: Nursing Bowel, Edema, Endurance, Medication Management, Pain, Safety, Skin Integrity  PT Balance, Edema, Endurance, Motor, Pain, Perception, Safety, Sensory, Skin Integrity, Nutrition  OT Balance, Endurance  SLP    TR         Basic ADLs: OT Bathing, Dressing, Toileting     Advanced  ADLs: OT Simple Meal Preparation, Light Housekeeping     Transfers: PT Bed Mobility, Bed to Chair, Car, Lobbyist, Technical Brewer: PT Ambulation, Stairs     Additional Impairments: OT    SLP        TR      Anticipated Outcomes Item Anticipated Outcome  Self Feeding ModI to Ind  Swallowing      Basic self-care  ModI to Ind  Toileting  ModI to [employer] ModI to Ind  Bowel/Bladder  manage bowels with mediications/ anuric  Transfers  Mod I  Locomotion  Mod I/ supervision  Communication     Cognition     Pain  <4 w/ prn  Safety/Judgment  manage safety with mod I assistance   Therapy Plan: PT Intensity: Minimum of 1-2 x/day ,45 to 90 minutes PT Frequency: 5 out of 7 days PT Duration Estimated Length of Stay: 10-14 days OT Intensity: Minimum of 1-2 x/day, 45 to 90 minutes OT Frequency: 5 out of 7 days OT Duration/Estimated Length of Stay: 10-14 days     Team Interventions: Nursing Interventions Patient/Family Education, Bladder Management, Bowel Management, Disease Management/Prevention, Pain Management, Medication Management, Skin Care/Wound Management  PT interventions Ambulation/gait training, Community reintegration, DME/adaptive equipment instruction, Neuromuscular re-education, Psychosocial support, Stair training, UE/LE Strength taining/ROM, UE/LE Coordination activities, Therapeutic Activities, Skin care/wound  management, Pain management, Functional electrical stimulation, Discharge planning, Balance/vestibular training, Cognitive remediation/compensation, Disease management/prevention, Functional mobility training, Patient/family education, Splinting/orthotics, Therapeutic Exercise, Visual/perceptual remediation/compensation  OT Interventions Balance/vestibular training, Self Care/advanced ADL retraining, Therapeutic Exercise, DME/adaptive equipment instruction, UE/LE Strength taining/ROM, Patient/family education, Functional mobility training, Therapeutic Activities  SLP Interventions    TR Interventions    SW/CM Interventions Discharge Planning, Psychosocial Support, Patient/Family Education   Barriers to Discharge MD  Medical stability, Home enviroment access/loayout, Wound care, Lack of/limited family support, Weight, and Hemodialysis  Nursing Decreased caregiver support, Home environment access/layout, Incontinence, Hemodialysis Discharge: House  Discharge Home Layout: Two level, Bed/bath upstairs, 1/2 bath on main level  Alternate Level Stairs-Rails: Right, Left  Alternate Level Stairs-Number of Steps: 10  Discharge Home Access: Stairs to enter  Entrance Stairs-Rails: None  Entrance Stairs-Number of Steps: 2; intermittent assist available, family have been rotating to assisting with PD needs, meals, driving, etc  PT Inaccessible home environment, Decreased caregiver support, Home environment access/layout, IV antibiotics, Incontinence, Wound Care, Lack of/limited family support, Insurance for SNF coverage, Hemodialysis (IV antivirals; peritoneal dialysis)    OT None    SLP      SW Decreased caregiver support, Community Education Officer for SNF coverage, Hemodialysis     Team Discharge Planning: Destination: PT-Home ,OT- Home , SLP-  Projected Follow-up: PT-Home health PT, Outpatient PT, 24 hour supervision/assistance, OT-  None, SLP-  Projected Equipment Needs: PT-To be determined, OT- To be determined, SLP-   Equipment Details: PT- , OT-  Patient/family involved in discharge planning: PT- Patient,  OT-Patient, SLP-   MD ELOS: 10-14  days Medical Rehab Prognosis:  Good Assessment: The patient has been admitted for CIR therapies with the diagnosis of debility due to shock pituitary apoplexy. The team will be addressing functional mobility, strength, stamina, balance, safety, adaptive techniques and equipment, self-care, bowel and bladder mgt, patient and caregiver education, . Goals have been set at mod I. Anticipated discharge destination is home with family.        See Team Conference Notes for weekly updates to the plan of care

## 2024-09-30 NOTE — Progress Notes (Signed)
 Inpatient Rehabilitation  Patient information reviewed and entered into eRehab system by [facility]. Karen Kays., CCC/SLP, PPS Coordinator.  Information including medical coding, functional ability and quality indicators will be reviewed and updated through discharge.

## 2024-10-01 ENCOUNTER — Other Ambulatory Visit

## 2024-10-01 DIAGNOSIS — R195 Other fecal abnormalities: Secondary | ICD-10-CM

## 2024-10-01 DIAGNOSIS — D62 Acute posthemorrhagic anemia: Secondary | ICD-10-CM

## 2024-10-01 DIAGNOSIS — R932 Abnormal findings on diagnostic imaging of liver and biliary tract: Secondary | ICD-10-CM

## 2024-10-01 DIAGNOSIS — F09 Unspecified mental disorder due to known physiological condition: Secondary | ICD-10-CM

## 2024-10-01 DIAGNOSIS — R197 Diarrhea, unspecified: Secondary | ICD-10-CM

## 2024-10-01 LAB — CBC WITH DIFFERENTIAL/PLATELET
Abs Immature Granulocytes: 0.2 K/uL — ABNORMAL HIGH (ref 0.00–0.07)
Basophils Absolute: 0 K/uL (ref 0.0–0.1)
Basophils Relative: 0 %
Eosinophils Absolute: 0 K/uL (ref 0.0–0.5)
Eosinophils Relative: 0 %
HCT: 23.8 % — ABNORMAL LOW (ref 36.0–46.0)
Hemoglobin: 8.1 g/dL — ABNORMAL LOW (ref 12.0–15.0)
Lymphocytes Relative: 10 %
Lymphs Abs: 1.6 K/uL (ref 0.7–4.0)
MCH: 36.8 pg — ABNORMAL HIGH (ref 26.0–34.0)
MCHC: 34 g/dL (ref 30.0–36.0)
MCV: 108.2 fL — ABNORMAL HIGH (ref 80.0–100.0)
Monocytes Absolute: 0.8 K/uL (ref 0.1–1.0)
Monocytes Relative: 5 %
Myelocytes: 1 %
Neutro Abs: 13.3 K/uL — ABNORMAL HIGH (ref 1.7–7.7)
Neutrophils Relative %: 84 %
Platelets: 244 K/uL (ref 150–400)
RBC: 2.2 MIL/uL — ABNORMAL LOW (ref 3.87–5.11)
RDW: 15.8 % — ABNORMAL HIGH (ref 11.5–15.5)
WBC: 15.8 K/uL — ABNORMAL HIGH (ref 4.0–10.5)
nRBC: 2.4 % — ABNORMAL HIGH (ref 0.0–0.2)

## 2024-10-01 LAB — GLUCOSE, CAPILLARY
Glucose-Capillary: 80 mg/dL (ref 70–99)
Glucose-Capillary: 116 mg/dL — ABNORMAL HIGH (ref 70–99)
Glucose-Capillary: 124 mg/dL — ABNORMAL HIGH (ref 70–99)
Glucose-Capillary: 194 mg/dL — ABNORMAL HIGH (ref 70–99)

## 2024-10-01 LAB — MAGNESIUM: Magnesium: 2 mg/dL (ref 1.7–2.4)

## 2024-10-01 LAB — COMPREHENSIVE METABOLIC PANEL WITH GFR
ALT: 27 U/L (ref 0–44)
AST: 28 U/L (ref 15–41)
Albumin: 2.8 g/dL — ABNORMAL LOW (ref 3.5–5.0)
Alkaline Phosphatase: 317 U/L — ABNORMAL HIGH (ref 38–126)
Anion gap: 19 — ABNORMAL HIGH (ref 5–15)
BUN: 58 mg/dL — ABNORMAL HIGH (ref 6–20)
CO2: 24 mmol/L (ref 22–32)
Calcium: 8.5 mg/dL — ABNORMAL LOW (ref 8.9–10.3)
Chloride: 94 mmol/L — ABNORMAL LOW (ref 98–111)
Creatinine, Ser: 12.31 mg/dL — ABNORMAL HIGH (ref 0.44–1.00)
GFR, Estimated: 3 mL/min — ABNORMAL LOW (ref >60)
Glucose, Bld: 93 mg/dL (ref 70–99)
Potassium: 3.2 mmol/L — ABNORMAL LOW (ref 3.5–5.1)
Sodium: 137 mmol/L (ref 135–145)
Total Bilirubin: 1.3 mg/dL — ABNORMAL HIGH (ref 0.0–1.2)
Total Protein: 6.1 g/dL — ABNORMAL LOW (ref 6.5–8.1)

## 2024-10-01 LAB — PHOSPHORUS: Phosphorus: 7.5 mg/dL — ABNORMAL HIGH (ref 2.5–4.6)

## 2024-10-01 MED ORDER — HYDROCORTISONE 5 MG PO TABS
15.0000 mg | ORAL_TABLET | Freq: Two times a day (BID) | ORAL | Status: DC
  Administered 2024-10-03 – 2024-10-08 (×11): 15 mg via ORAL
  Filled 2024-10-01 (×11): qty 1

## 2024-10-01 MED ORDER — GENTAMICIN SULFATE 0.1 % EX CREA
1.0000 | TOPICAL_CREAM | Freq: Every day | CUTANEOUS | Status: DC
  Administered 2024-10-01: 19:00:00 1 via TOPICAL
  Filled 2024-10-01: qty 15

## 2024-10-01 MED ORDER — HYDROCORTISONE 5 MG PO TABS
25.0000 mg | ORAL_TABLET | Freq: Two times a day (BID) | ORAL | Status: AC
  Administered 2024-10-01 – 2024-10-02 (×3): 25 mg via ORAL
  Filled 2024-10-01 (×3): qty 1

## 2024-10-01 MED ORDER — NYSTATIN 100000 UNIT/ML MT SUSP
5.0000 mL | Freq: Four times a day (QID) | OROMUCOSAL | Status: DC
  Administered 2024-10-01 – 2024-10-07 (×21): 500000 [IU] via ORAL
  Filled 2024-10-01 (×23): qty 5

## 2024-10-01 MED ADMIN — Hydrocortisone Sodium Succinate PF For Inj 100 MG: 50 mg | INTRAVENOUS | @ 09:00:00 | NDC 00009001103

## 2024-10-01 NOTE — Progress Notes (Signed)
 Physical Therapy Session Note  Patient Details  Name: [name] MRN: [id number] Date of Birth: 17-May-1966  Today's Date: 10/01/2024 PT Individual Time: 8984-8871 PT Individual Time Calculation (min): 73 min   Short Term Goals: Week 1:  PT Short Term Goal 1 (Week 1): Pt will perform all standing transfers with SBA. PT Short Term Goal 2 (Week 1): Pt will ambulate at least 60 ft per bout using LRAD with overall SBA demonstrating improved activity tolerance. PT Short Term Goal 3 (Week 1): Pt will initiate stair training. PT Short Term Goal 4 (Week 1): Pt will perform appropriate outcome measure.  Skilled Therapeutic Interventions/Progress Updates:      Pt supine in bed upon arrival. Pt agreeable to therapy. Pt denies any pain.   Pt ambulated 1x104, 1x90 feet with RW and CGA with WC in tow. Pt required seated rest break between trails 2/2 fatigue.   Pt ambulated 1x20, 1x10 feet with no AD and min A-verbal cues provided for increased step length and foot clearance.   Pt navigated 5' inch curb step with RW and CGA, verbal cues provided for sequencing and technique.   Pt required prolonged seated rest breaks for fatigue.   Pt seated in WC at end of session with all needs within reach and chair alarm on.   Therapy Documentation Precautions:  Precautions Precautions: Fall Recall of Precautions/Restrictions: Intact Precaution/Restrictions Comments: PD catheter at mid lower abdomen, triple lumen central line in R jugular, on ART for HIV(+) Restrictions Weight Bearing Restrictions Per Provider Order: No  Therapy/Group: Individual Therapy  [facility] [name], [place], DPT  10/01/2024, 10:17 AM

## 2024-10-01 NOTE — Progress Notes (Signed)
 Physical Therapy Note  Patient Details  Name: [name] MRN: [id number] Date of Birth: 22-Feb-1966 Today's Date: 10/01/2024    Physical Therapist participated in the interdisciplinary team conference, providing clinical information regarding the patients current status, treatment goals, and weekly focus, including any barriers that need to be addressed. Please see the Inpatient Rehabilitation Team Conference and Plan of Care Update for further details.   [facility] [place], [place], DPT  10/01/2024, 11:44 AM

## 2024-10-01 NOTE — Discharge Instructions (Signed)
 Inpatient Rehab Discharge Instructions  [name] [facility]  Discharge date and time: 10/08/2024  Activities/Precautions/ Functional Status:  Activity: activity as tolerated and no lifting, driving, or strenuous exercise for until cleared by provider  Diet: diabetic diet  Wound Care: as directed   Functional status:  ___ No restrictions     ___ Walk up steps independently ___ 24/7 supervision/assistance   ___ Walk up steps with assistance __x_ Intermittent supervision/assistance  ___ Bathe/dress independently _x__ Walk with walker    ___ Bathe/dress with assistance ___ Walk Independently    ___ Shower independently ___ Walk with assistance    ___ Shower with assistance __X_ No alcohol      ___ Return to work/school ________  Special Instructions:  Continue Probiotic supplement daily.   My questions have been answered and I understand these instructions. I will adhere to these goals and the provided educational materials after my discharge from the hospital.  Patient/Caregiver Signature _______________________________ Date __________  Clinician Signature _______________________________________ Date __________  Please bring this form and your medication list with you to all your follow-up doctor's appointments.    COMMUNITY REFERRALS UPON DISCHARGE:    Home Health:   PT     OT   RN   AIDE                Agency:[facility]   Phone:[phone]    Medical Equipment/Items Ordered: HAS ROLLING WALKER FROM PREVIOUS ADMISSION NEEDS TUB BENCH WILL GET ON OWN DUE TO PRIVATE PAY

## 2024-10-01 NOTE — Progress Notes (Signed)
 Occupational Therapy Session Note  Patient Details  Name: [name] MRN: [id number] Date of Birth: 09/15/1966  Today's Date: 10/01/2024 OT Individual Time: 1135-1200 & 1425-1505 OT Individual Time Calculation (min): 25 min& 40 min    Short Term Goals: Week 1:  OT Short Term Goal 1 (Week 1): The pt will safely complete LB bathing and dressing with Min/ ModI using AE as needed. OT Short Term Goal 2 (Week 1): The pt will safely complete toileting with Min/ ModI using AE as needed. OT Short Term Goal 3 (Week 1): The pt will complete safely complete tub/shower transfers using AE with Min/ ModI after demonstration and initial cues. OT Short Term Goal 4 (Week 1): The pt will safely complete  OT therapy 85% of the time with not more than 3 rest breaks. OT Short Term Goal 5 (Week 1): The pt will safely ambulate for function using a  RW with closeS and additional time.  Skilled Therapeutic Interventions/Progress Updates:      Therapy Documentation Precautions:  Precautions Precautions: Fall Recall of Precautions/Restrictions: Intact Precaution/Restrictions Comments: PD catheter at mid lower abdomen, triple lumen central line in R jugular, on ART for HIV(+) Restrictions Weight Bearing Restrictions Per Provider Order: No Session 1 General: Pt seated in W/C upon OT arrival, agreeable to OT.  Pain: no pain reported  Exercises: Pt completed 10 minutes of arm bike, 5 min forward, 5 min backward in order to increase BUE/BLEstrength and endurance in preparation for increased independence in ADLs and activity tolerance. Frequent rest breaks d/t fatigue, on level 1 resistance.  Other Treatments: OT providing therapeutic use of self in order to build rapport and discuss patient current situation and goals for therapy.   Pt seated in W/C at end of session with call light within reach and 4Ps assessed.    Session 2 General: Pt seated in W/C upon OT arrival, agreeable to OT.  Pain:   unrated pain reported in lower back d/t sciatica, activity, intermittent rest breaks, distractions provided for pain management, pt reports tolerable to proceed.   Exercises: OT providing skilled intervention in various stretches in order to decrease sciatic pain and decrease muscle tightness. OT demonstrating stretches and pt able to use teach back method to complete stretches. OT also recommended massager/tennis ball for self massage on sciatic nerve/piriformis and teaching pt how to complete. Pt understanding and appreciative of stretches, reporting her pain had decreased after completing.   Pt supine in bed with direct handoff to nsg.   Therapy/Group: Individual Therapy  Camie Hoe, OTD, OTR/L 10/01/2024, 5:00 PM

## 2024-10-01 NOTE — Patient Care Conference (Signed)
 Inpatient RehabilitationTeam Conference and Plan of Care Update Date: 10/01/2024   Time: 1143 am    Patient Name: [name]      Medical Record Number: [id number]  Date of Birth: 11-Jul-1966 Sex: Female         Room/Bed: 4M09C/4M09C-01 Payor Info: Payor: [employer] MEDICARE / Plan: [employer] MEDICARE HMO / Product Type: *No Product type* /    Admit Date/Time:  09/27/2024  5:13 PM  Primary Diagnosis:  Debility  Hospital Problems: Principal Problem:   Debility Active Problems:   Heme positive stool    Expected Discharge Date: Expected Discharge Date: 10/08/24  Team Members Present: Physician leading conference: Dr. Duwaine Barrs Social Worker Present: [name], LCSW Nurse Present: Eulalio Falls, RN PT Present: [name], PT OT Present: Delon Sharps, OT;[place], OT PPS Coordinator present : Eleanor Colon, SLP     Current Status/Progress Goal Weekly Team Focus  Bowel/Bladder   patient is continent of bowel. d/t CKD patient is anuric   to remain continent of bowel   to assist with bowel needs    Swallow/Nutrition/ Hydration   patient is currently tolerating a regular diet   to maintain a regular diet  to assist with nutritional needs    ADL's   bathing/dressing w/c level at sink-CGA/min A; transfers-min A; fatigues quickly;   mod I overall   endurance, BADLs, transfers, education    Mobility   bed mobility supervision with increased time from flat bed with use of bed rails, sit to stand/stand pivot transfer CGA with RW, min A with no AD, gait x 104 feet with RW and CGA, x 20 feet with no AD and min A, curb step navigation with RW and CGA.   mod I/sup  D/C 12/23, follow up HHPT, DME: pt has RW, barriers to discharge: endurance, sedentary for past 2 months    Communication                Safety/Cognition/ Behavioral Observations               Pain   patient currently complains of back pain   to keep pain <3   to keep pain  level <3    Skin   skin intact with no open areas   to continue to mobilize patient  to moblize patient to prevent skin breakdown      Discharge Planning:  Home with family intermittently coming in to assist-was prior to admission. Having medical issues-will await team recommendations. Pt required assist prior to admission due to decline in function prior to admission    Team Discussion: Patient was admitted post debility related to circulatory shock, adrenal insufficiency/pituitary apoplexy, new CHF. Patient on peritoneal dialysis. Patient with leukocytosis/ anemia/ abdominal tenderness /tremors : treatment adjusted by MD. Patient progress limited by self limiting behaviors, low endurance and fatigue.  Patient on target to meet rehab goals: Currently patient needs CGA-min assistance with all ADLs. Patient needs CGA with transfers using a rolling walker and needs minimal assistance with no adaptive equipment. Patient was able to ambulate up to 22' with CGA using a rolling walker. Overall goals at discharge are set for mod I- supervision assistance.  *See Care Plan and progress notes for long and short-term goals.   Revisions to Treatment Plan:  Daily weightCurrently patient needs  Heart healthy diet Discontinue CVL IR consult GI consult  Teaching Needs: Safety, medications, heart healthy diet, toileting, transfers. Etc.   Current Barriers to Discharge: Decreased caregiver support,  Home enviroment access/layout, and peritoneal dialysis  Possible Resolutions to Barriers: Family Education Home health follow up DME: TTB, [facility]. Patient has RW     Medical Summary Current Status: Hb dropping; and leukocytosis- likely due to CVL in neck- also sciatica; Perotoneal dialyusis- anuric  Barriers to Discharge: Electrolyte abnormality;Behavior/Mood;Medical stability;Renal Insufficiency/Failure;Self-care education;Morbid Obesity;Uncontrolled Pain;Symptomatic Anemia;Infection/IV Antibiotics   Barriers to Discharge Comments: leukocytosis; Symptomatic anemia- ESRD on PD; CVL in neck-sciatica; slef limiting beahavior- Possible Resolutions to [employer] Focus: getting GI involved for Lower GI bleed most likely- sotpped Heparin ; Calling IR for CVL that needs to be removed;  12/23 d/c   Continued Need for Acute Rehabilitation Level of Care: The patient requires daily medical management by a physician with specialized training in physical medicine and rehabilitation for the following reasons: Direction of a multidisciplinary physical rehabilitation program to maximize functional independence : Yes Medical management of patient stability for increased activity during participation in an intensive rehabilitation regime.: Yes Analysis of laboratory values and/or radiology reports with any subsequent need for medication adjustment and/or medical intervention. : Yes   I attest that I was present, lead the team conference, and concur with the assessment and plan of the team.   [name] 10/01/2024, 1143 am

## 2024-10-01 NOTE — Progress Notes (Signed)
 PROGRESS NOTE   Subjective/Complaints:   Pt reports she's nauseated, but doesn't feel sick otherwise.  Has had nausea for ~ 2 weeks.   Was sleeping deeply and I scared her when I opened door to see her! Notes has been having shaking/tremors in hands for months, but hasn't gotten worse while in hospital. Makes it hard to text or use phone.   Notes most Abdominal tenderness is due to heparin  shots. We discussed that heparin  was stopped for now     ROS:    Pt denies SOB, abd pain, CP, (+) N/V/C/D, and vision changes  Per HPI +Back pain controlled usually   Objective:   DG Chest 2 View Result Date: 09/30/2024 EXAM: 2 VIEW(S) XRAY OF THE CHEST 09/30/2024 01:30:00 PM COMPARISON: Comparison with 09/23/2024. CLINICAL HISTORY: Leukocytosis. FINDINGS: LINES, TUBES AND DEVICES: Right central venous catheter has been placed with tip projecting over the mid SVC region. LUNGS AND PLEURA: Shallow inspiration. No focal pulmonary opacity. No pleural effusion. No pneumothorax. HEART AND MEDIASTINUM: Postoperative changes with sternotomy wires and aortic stent graft. Heart size and pulmonary vascularity are normal. BONES AND SOFT TISSUES: Surgical clips in the base of the neck. No acute osseous abnormality. IMPRESSION: 1. Right central venous catheter with tip over the mid SVC, without pneumothorax. Electronically signed by: Elsie Gravely MD 09/30/2024 01:50 PM EST RP Workstation: [id number]   CT ABDOMEN PELVIS WO CONTRAST Result Date: 09/30/2024 CLINICAL DATA:  Anemia, history of aortic dissection. End-stage renal disease. EXAM: CT ABDOMEN AND PELVIS WITHOUT CONTRAST TECHNIQUE: Multidetector CT imaging of the abdomen and pelvis was performed following the standard protocol without IV contrast. RADIATION DOSE REDUCTION: This exam was performed according to the departmental dose-optimization program which includes automated exposure control,  adjustment of the mA and/or kV according to patient size and/or use of iterative reconstruction technique. COMPARISON:  CT abdomen pelvis 09/07/2024. FINDINGS: Lower chest: Bibasilar scarring and volume loss. Partially imaged endovascular stent graft in the lower descending thoracic aorta, extending to the diaphragmatic hiatus. Descending thoracic aorta measures up to approximately 5.1 cm. Heart is enlarged. Age advanced right coronary artery calcification. No pericardial or pleural effusion. Distal esophagus is grossly unremarkable. Hepatobiliary: Liver margin is slightly irregular and the liver is enlarged, 18.2 cm. Noncalcified gallstones. Tiny perihepatic ascites. No biliary ductal dilatation. Pancreas: Negative. Spleen: Negative. Adrenals/Urinary Tract: Right adrenal gland is unremarkable. Left adrenal adenoma. No specific follow-up necessary. Kidneys are mildly atrophic. Excreted contrast in the right renal collecting system related to CT angiography 09/23/2024. Bilateral renal stones and/or renal vascular calcifications. Ureters are decompressed. Contrast is seen in a low volume bladder. Stomach/Bowel: Stomach, small bowel, appendix and colon are unremarkable. Vascular/Lymphatic: Atherosclerotic calcification of the aorta. No pathologically enlarged lymph nodes. Calcified dissection flap in the aorta. Further assessment is limited without IV contrast. Reproductive: No adnexal mass. Other: Small pelvic free fluid. Percutaneous dialysis catheter terminates in the anatomic pelvis. Small umbilical hernia contains fat. Small bilateral inguinal hernias contain fat. Musculoskeletal: Degenerative changes in the spine. IMPRESSION: 1. No acute findings. 2. Age advanced right coronary artery calcification. 3. Cirrhosis. 4. Cholelithiasis. 5. Minimal ascites with peritoneal dialysis catheter in place. 6. Left adrenal adenoma. 7.  Bilateral renal stones and/or renal vascular calcifications. 8. Partially visualized  endovascular stent graft in an aneurysmal descending thoracic aorta, with a chronic dissection flap in the abdominal aorta. 9.  Aortic atherosclerosis (ICD10-I70.0). Electronically Signed   By: Newell Eke M.D.   On: 09/30/2024 10:44   Recent Labs    09/30/24 0402 10/01/24 0428  WBC 15.3* 15.8*  HGB 7.6* 8.1*  HCT 22.1* 23.8*  PLT 230 244   Recent Labs    09/30/24 0402 10/01/24 0428  NA 133* 137  K 3.9 3.2*  CL 94* 94*  CO2 22 24  GLUCOSE 83 93  BUN 57* 58*  CREATININE 12.56* 12.31*  CALCIUM  7.8* 8.5*    Intake/Output Summary (Last 24 hours) at 10/01/2024 0819 Last data filed at 10/01/2024 0700 Gross per 24 hour  Intake 476 ml  Output 289 ml  Net 187 ml        Physical Exam: Vital Signs Blood pressure 127/65, pulse 90, temperature 98.5 F (36.9 C), resp. rate 17, height 5' 5 (1.651 m), weight 79.2 kg, last menstrual period 03/31/2014, SpO2 97%.     General: awake, alert, appropriate, supine in bed; just woke her up; very startled; NAD HENT: conjugate gaze; oropharynx dry; CVL in R neck-  CV: regular rate and rhythm- rate in 90's; no JVD Pulmonary: CTA B/L; no W/R/R- good air movement GI: soft, mildly TTP over heparin  shots area in abdomen- no rebound; ND, PD catheter in place; normoactive BS Psychiatric: appropriate- interactive after woke up Neurological: Ox3  Skin: PD site clean, dressed. A few scattered bruises Neuro:   Alert and awake. Normal insight and awareness. Normal language and speech. Cranial nerve exam unremarkable. Moving all 4 extremities to gravity and resistance. Stocking glove sensory loss below ankles and to a lesser extent in both hands.  No abnl resting tone.    Assessment/Plan: 1. Functional deficits which require 3+ hours per day of interdisciplinary therapy in a comprehensive inpatient rehab setting. Physiatrist is providing close team supervision and 24 hour management of active medical problems listed below. Physiatrist and  rehab team continue to assess barriers to discharge/monitor patient progress toward functional and medical goals  Care Tool:  Bathing    Body parts bathed by patient: Right arm, Left arm, Chest, Abdomen, Front perineal area, Buttocks, Right upper leg, Left upper leg, Face, Right lower leg, Left lower leg   Body parts bathed by helper: Left lower leg, Right lower leg     Bathing assist Assist Level: Contact Guard/Touching assist     Upper Body Dressing/Undressing Upper body dressing   What is the patient wearing?: Pull over shirt    Upper body assist Assist Level: Minimal Assistance - Patient > 75%    Lower Body Dressing/Undressing Lower body dressing      What is the patient wearing?: Pants     Lower body assist Assist for lower body dressing: Contact Guard/Touching assist     Toileting Toileting    Toileting assist Assist for toileting: Minimal Assistance - Patient > 75%     Transfers Chair/bed transfer  Transfers assist     Chair/bed transfer assist level: Minimal Assistance - Patient > 75%     Locomotion Ambulation   Ambulation assist      Assist level: Minimal Assistance - Patient > 75% Assistive device: Hand held assist Max distance: 20 ft   Walk 10 feet activity   Assist     Assist level: Contact Guard/Touching assist Assistive device: Walker-rolling  Walk 50 feet activity   Assist Walk 50 feet with 2 turns activity did not occur: Safety/medical concerns         Walk 150 feet activity   Assist Walk 150 feet activity did not occur: Safety/medical concerns         Walk 10 feet on uneven surface  activity   Assist Walk 10 feet on uneven surfaces activity did not occur: Safety/medical concerns         Wheelchair     Assist Is the patient using a wheelchair?: Yes (may not require by d/c for use at home) Type of Wheelchair: Manual    Wheelchair assist level: Dependent - Patient 0% Max wheelchair distance: 300 ft     Wheelchair 50 feet with 2 turns activity    Assist        Assist Level: Dependent - Patient 0%   Wheelchair 150 feet activity     Assist      Assist Level: Dependent - Patient 0%   Blood pressure 127/65, pulse 90, temperature 98.5 F (36.9 C), resp. rate 17, height 5' 5 (1.651 m), weight 79.2 kg, last menstrual period 03/31/2014, SpO2 97%.   Medical Problem List and Plan: 1. Functional deficits secondary to debility related to circulatory shock, adrenal insufficiency/pituitary apoplexy, new CHF             -patient may  shower             -ELOS/Goals: 7-10 days, mod I goals.              -family is looking at some options for intermittent assistance at home  - Head CT completed last night 12/13 when she bumped her head, no acute abnormalities   Team conference today to determine LOS  Con't CIR PT and OT 2.  Antithrombotics: -DVT/anticoagulation:  Mechanical: Sequential compression devices, below knee Bilateral lower extremities Pharmaceutical: Heparin   12/16- Heparin  held due to dropping Hb             -antiplatelet therapy: Aspirin     3. Pain Management:  Tylenol  and Oxycodone  prn.   12/16- denies pain this AM- con't regimen 4. Mood/Behavior/Sleep: LCSW to follow for evaluation and support when available.              -antipsychotic agents: Buspar  10 mg BID prn for anxiety and Remeron  7.5 mg at bedtime.              -pt seems positive. Family very supportive 5. Neuropsych/cognition: This patient is capable of making decisions on her own behalf.   6. Skin/Wound Care: Routine pressure relief measures.    12/16- Con't CVL since hard to get blood from her-  7. Fluids/Electrolytes/Nutrition: Monitor I&O and weight. Follow up labs CBC/CMP               -GERD: Omeprazole  40 mg bid    8. 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    9.ESRD: Continue nightly PD per nephrology.              -pt was independent  with management of her PD at home  -12/14, discussed Renvela  with nephrology, they plan to resume  12/16- K+ 3.2- per renal-  10.Hx of papillary thyroid  cancer s/p thyroidectomy: TSH normal, continue Synthroid .    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    12. HFrEF, without acute exacerbation: Nephrology  to manage volume status. Cardiology following. Continue heart healthy diet and GDMT as tolerated. Daily weights-appears stable  12/16- Weight up to 79.2- up 2 lbs from day prior- but was done at 1400- no weight today- will ask nursing for one.  Filed Weights   09/28/24 0635 09/29/24 0628 09/30/24 1400  Weight: 77.9 kg 78.1 kg 79.2 kg      13. Low back pain/Abdominal pain: present since aortic dissection 11/24. Continue to monitor. MRI T and L spine negative, RUQ US  negative for acute findings.              -continue Lidocaine  patch              -pt is able to work through discomfort  -12/14 had some oxycodone  yesterday for her back pain but doing better today  12/15- might need to get CT of abd/pelvis- will d/w team  12/16- Been having abd pain/back pain for 2 weeks- CT didn't show any acute pathology- did show Cirrhosis, cholelithiasis and B/L renal stones.  14. HTN: Monitor BP per protocol, continue Metoprolol    12/16- BP controlled- con't regimen      10/01/2024    5:17 AM 09/30/2024    7:57 PM 09/30/2024    7:26 PM  Vitals with BMI  Systolic 127 138   Diastolic 65 71   Pulse 90 85 88    15. HLD: Crestor  10 mg    16. HIV: CD4 445 on 10/15. Continue home [name]  and Descovy               12/16- will call ID about rising WBC- esp in light of HIV 17. T2DM: Monitor glucose ac/hs with SSI.              -associated peripheral neuropathy  -12/16- CBG's looking good- 80-160's- will con't to monitor     -CBG (last 3)  Recent Labs    09/30/24 1702 09/30/24 2050 10/01/24 0615  GLUCAP 169* 146* 80   18.  Chronic nausea/poor appetite  -12/13 Ate 50%, continue monitor intake  -Continue Remaron  19. Diarrhea  -12/13 LBM recorded yesterday type 4, medium, continue to monitor   -pt interested in trying probiotics, will start  12/16- Denies diarrhea- LBM yesterday 20. Leukocytosis  12/15- WBC 15.3 up from 13k -will check if voids at all- and order U/A and Cx if so and check CXR- will also order labs for AM- and  might need to remove CVL in R neck-   12/16- got Blood C'x- so far, nothing back yet- WBC up somewhat to 15.8- from 15.3- will call ID- is anuric, so don't have source there and CXR was (-) for acute issues. - spoke to ID- they don't feel it's worrisome, but keep an eye on Cx's- and manage if clinically gets worse.  21. Acute on chronic anemia  12/15- Due to ESRD- but also dropped almost 1 unit since Saturday- which is 1 unit lower than average last 1 week. Will check CT of abd/pelvis and hemoccult- also reached out to Renal about this.   12/16- CT was (-) for acute pathology that would explain- Hemoccult (+)- stopped Heparin  SQ- and GI called- will check into this.  Also Hb up somewhat to 8.1 today   I spent a total of  53  minutes on total care today- >50% coordination of care- due to  D/w ID, NP; and nursing- also did team conference to determine LOS. Reviewed labs, vitals and B/B  LOS: 4 days  A FACE TO FACE EVALUATION WAS PERFORMED  Morad Tal 10/01/2024, 8:19 AM

## 2024-10-01 NOTE — Consult Note (Signed)
 Neuropsychological Consultation Comprehensive Inpatient Rehab   Patient:   [name]   DOB:   04-Dec-1965  MR Number:  [id number]  Location:  [facility]  [facility] [address] [place] [address] Dept: [phone] Loc: [phone]           Date of Service:   10/01/2024  Start Time:   3 PM End Time:   4 PM  Provider/Observer:  Norleen Asa, Psy.D.       Clinical Neuropsychologist       Billing Code/Service: [phone]  Reason for Service:    [name] is a 58 year old female referred for neuropsychological consultation during her ongoing admission to the comprehensive inpatient rehabilitation unit.  Patient was recently hospitalized with complex medical status including acute development of septic shock and encephalopathic status.  Patient was referred for neuropsychological assessment/screening due to ongoing but improving cognitive status as well as coping and adjustment with extended hospital stay and complicated medical status.  Presenting Concerns:  The patient reports significant confusion and memory difficulties, particularly regarding the events leading to her hospitalization. She expresses confusion about her diagnosis and the sequence of events. She also reports ongoing back pain and hand issues. Her sister (who called two thirds of the way through the visit today and was available on speaker phone), who is her caregiver, reports that the patient has not been her usual self for the past 2-3 months.  Relevant Clinical History:  - Neurological: History of pituitary apoplexy. Recent septic shock with associated encephalopathy. MRI of lumbar and thoracic spine was unrevealing for acute findings.  - Psychiatric: No current or past psychiatric diagnoses outside of previous diagnosis of generalized anxiety disorder without acute exacerbation noted.  Patient continues on her home medications  including BuSpar  for anxiety.  Patient does have a prescription for Remeron  7.5 mg nightly to aid with onset of sleep.  - Medical: Extensive medical history including Graves' disease, hypertension, type 2 diabetes mellitus, end-stage renal disease on peritoneal dialysis, HIV, aortic dissection s/p repair (08/2023), papillary thyroid  carcinoma s/p thyroidectomy, sarcoidosis, obstructive sleep apnea, and breast cancer previously on chemotherapy. Recent admission from 09/07/2024 to 09/12/2024 for similar presentation. Current admission for septic shock, which was identified in the emergency department. She was treated with IV fluids and vasopressors. An echocardiogram showed a decreased left ventricular ejection fraction of 30-35% with moderate to severe mitral regurgitation. Cardiac catheterizations were normal. She is currently being treated for a C. difficile infection. Labs on admission showed a high-sensitivity troponin of 338, sodium of 130, potassium of 2.8, BUN of 39, creatinine of 14.09, magnesium  of 2.6, WBC of 7K, and hemoglobin of 11.8. A chest X-ray showed no active disease. A CTA of the chest, abdomen, and pelvis revealed a stable thoracoabdominal aortic aneurysm and dissection with no evidence of leak or rupture, and persistent pneumoperitoneum and pelvic free fluid in the setting of a peritoneal dialysis catheter. Blood was found in her stool.  Functional Capacity:  Prior to this admission, the patient was independent in a two-level home, using a rolling walker more recently. She has become more sedentary overall. Currently, she requires minimal assistance to stand, uses a rolling walker for support, and requires minimal assistance to steady herself. She is limited by decreased activity, impaired balance, and global weakness. She is participating in a comprehensive rehabilitation program.  Mental State Examination:  - Appearance and Behaviour: Appeared well-groomed and was cooperative with the  interview.  Maintained good eye contact. No motor restlessness or agitation observed.  - Speech: Rate and volume were within normal limits. Spontaneous and clear.  - Mood and Affect: Reported feeling overwhelmed. Affect was appropriate to the content of the discussion, though somewhat labile and consistent with some degree of anxiety but not out of proportion to situation.  - Thought Process/Content: Thought process was linear and coherent. No evidence of disorganization or psychosis. Preoccupation with her recent illness and memory of the events.  - Cognition: Oriented to day of the week (Tuesday) and month (December) with effort. Remote memory appears intact. Recent memory is impaired, particularly for the period of her acute illness. Verbal fluency appears intact.  - Insight and Judgement: Insight into her cognitive difficulties is limited. She is aware she was very sick but struggles to understand the specifics of her condition. Judgement appears mildly impaired but there were significant impairments during her acute illness and early stages of interventions.    Clinical Impressions:  The patient presents with significant but improving cognitive difficulties, consistent with encephalopathy secondary to a recent septic shock. Her primary deficits appear to be in recent memory and executive functioning, particularly in her ability to process and understand complex medical information. She demonstrates some confusion and disorientation, though this is improving. Her engagement in the interview was good. Affective disturbance, including anxiety and frustration, is present and may be complicating her cognitive presentation.   Recommendations and Next Steps:  - Psychoeducation was provided to the patient and her sister regarding encephalopathy, septic shock, and the recovery process. The patient was advised to avoid ruminating on the events of her acute illness, as her memory of this period is likely  inaccurate due to significant illness.  - The patient was encouraged to reframe negative self-statements by adding right now (e.g., I can't do this right now).  - Continued liaison with the multidisciplinary team, including internal medicine, nephrology, and infectious disease, is recommended to coordinate care.  - The patient's level of anxiety and ongoing mild cognitive impairments do not appear to be having such a deleterious impact to keep her from actively participating in therapeutic interventions while on CIR.    Electronically Signed   _______________________ Norleen Asa, Psy.D. Clinical Neuropsychologist

## 2024-10-01 NOTE — Progress Notes (Signed)
 Occupational Therapy Session Note  Patient Details  Name: [name] MRN: [id number] Date of Birth: 01-26-1966  Today's Date: 10/01/2024 OT Individual Time: 8696-8595 OT Individual Time Calculation (min): 61 min    Short Term Goals: Week 1:  OT Short Term Goal 1 (Week 1): The pt will safely complete LB bathing and dressing with Min/ ModI using AE as needed. OT Short Term Goal 2 (Week 1): The pt will safely complete toileting with Min/ ModI using AE as needed. OT Short Term Goal 3 (Week 1): The pt will complete safely complete tub/shower transfers using AE with Min/ ModI after demonstration and initial cues. OT Short Term Goal 4 (Week 1): The pt will safely complete  OT therapy 85% of the time with not more than 3 rest breaks. OT Short Term Goal 5 (Week 1): The pt will safely ambulate for function using a  RW with closeS and additional time.  Skilled Therapeutic Interventions/Progress Updates:  Skilled OT session completed to address DME education and safety awareness with ADLs. Pt received supine in bed eating, agreeable to participate in therapy. Pt reports 5/10 pain in low back, OT provided pain intervention by providing rest breaks and repositioning.   OT begins building rapport with pt and reinforcing progress towards POC. Pt endorses debility d/t low back pain primarily causing decline in home. OT and pt collaborate on pain mgmt in the home and energy conservation techniques to reduce pain and risk for falls. Pt verbalized understanding. EOB>WC ambulating with no AE CGA. Pt dependently propelled to ADL apt. OT provided education and demo on TTB as pt reports fall in shower with sister prior to admission. Pt returns demo with TTB in two trials. Trial 1: WC>TTB supervision, TTB>WC Min A to lift LLE over tub, pt attempts to step out of tub instead of using TTB. Seated rest break required. Trial 2: WC<>TTB CGA, hand over hand support to reach standing following return transfer. OT  provided education on modifying task in home to have RW in bathroom to reduce risk for falls. Pt verbalized understanding; however continues to express fear of falling and reports 2 more falls within the home. Pt and OT collaborate on the following safety plan in the home to reduce risk for fall and increase safety awareness: -implementing energy conservation techniques and seated rest breaks to reduce pain and fatigue -supervision during bathing/cooking  -RW upstairs and downstairs to ensure AD for mobility at all times.   Pt returned to room seated in [facility] with all needs in reach.  Therapy Documentation Precautions:  Precautions Precautions: Fall Recall of Precautions/Restrictions: Intact Precaution/Restrictions Comments: PD catheter at mid lower abdomen, triple lumen central line in R jugular, on ART for HIV(+) Restrictions Weight Bearing Restrictions Per Provider Order: No    Therapy/Group: Individual Therapy  Marelin Tat Woods-Chance, MS, OTR/L 10/01/2024, 7:57 AM

## 2024-10-01 NOTE — Plan of Care (Signed)
°  Problem: Consults Goal: RH GENERAL PATIENT EDUCATION Description: See Patient Education module for education specifics. Outcome: Progressing   Problem: RH BOWEL ELIMINATION Goal: RH STG MANAGE BOWEL WITH ASSISTANCE Description: STG Manage Bowel with Assistance. Outcome: Progressing   Problem: RH SKIN INTEGRITY Goal: RH STG SKIN FREE OF INFECTION/BREAKDOWN Description: Manage skin free of infection with mod I assistance Outcome: Progressing   Problem: RH SAFETY Goal: RH STG ADHERE TO SAFETY PRECAUTIONS W/ASSISTANCE/DEVICE Description: STG Adhere to Safety Precautions With mod I Assistance/Device. Outcome: Progressing

## 2024-10-01 NOTE — Progress Notes (Signed)
°   10/01/24 1903  Peritoneal Catheter Mid lower abdomen  Placement Date/Time: 01/10/24 1213   Serial / Lot #: [id number]  Expiration Date: 04/15/28  Procedural Verification: Medical records & consent reviewed;Relevant studies,results and images reviewed;Site marked with initials  Time out: Correct Patient;Corre...  Site Assessment Clean, Dry, Intact  Drainage Description None  Catheter status Accessed;Patent  Dressing Gauze/Drain sponge  Dressing Status Clean, Dry, Intact  Dressing Intervention Assessed, no intervention needed  Cycler Setup  Total Number of Night Cycles 5  Night Fill Volume 2250  Dianeal  Solution Dextrose  1.5% in 6000 mL Low Cal/Low Mag (Dextrose  2.5%)  Dianeal  Additive  (None)  Night Dwell Time per Cycle - Hour(s) 1  Night Dwell Time per Cycle - Minute(s) 30  Night Time Therapy - Minute(s) 12  Night Time Therapy - Hour(s) 10  Minimum Initial Drain Volume 0  Maximum Peritoneal Volume 2250  Night/Total Therapy Volume 88749  Day Exchange No  Completion  Treatment Status Started  Procedure Comments  Tolerated treatment well? Yes  Peritoneal Dialysis Comments Pt. voice no complaints, up in bed talking on the phone.  Education / Care Plan  Dialysis Education Provided Yes  Hand-off documentation  Hand-off Given Given to shift RN/LPN  Report given to (Full Name) Lynwood Bunde RN  Hand-off Received Received from shift RN/LPN  Report received from (Full Name) Zebedee Mace   PD tx initation note:   Pre TX VS: 97.5, 87,18, 105/56, (72), Spo2 100 R/A  Pre TX weight: 78. 9 kg  PD treatment initiated via aseptic technique. Consent signed and in chart. Patient is alert and oriented. No complaints of pain. No specimen collected. PD exit site clean, dry and intact. Gentamycin and new dressing applied. Bedside RN educated on PD machine and how to contact tech support when PD machine alarms.

## 2024-10-01 NOTE — Progress Notes (Signed)
 Peritoneal Dialysis Treatment completed, Patient tolerated treatment well   10/01/24 0615  Peritoneal Catheter Mid lower abdomen  Placement Date/Time: 01/10/24 1213   Serial / Lot #: [id number]  Expiration Date: 04/15/28  Procedural Verification: Medical records & consent reviewed;Relevant studies,results and images reviewed;Site marked with initials  Time out: Correct Patient;Corre...  Site Assessment Clean, Dry, Intact  Drainage Description None  Catheter status Deaccessed  Dressing Gauze/Drain sponge  Dressing Status Clean, Dry, Intact  Dressing Intervention Assessed, no intervention needed  Completion  Treatment Status Complete  Initial Drain Volume 10  Average Dwell Time-Hour(s) 1  Average Dwell Time-Min(s) 30  Average Drain Time 18  Total Therapy Volume 88748  Total Therapy Time-Hour(s) 9  Total Therapy Time-Min(s) 58  Weight after Drain  (per floor)  Effluent Appearance Clear;Yellow  Fluid Balance - CCPD  Total UF (+ value on cycler, pt loss) 289 mL  Procedure Comments  Tolerated treatment well? Yes  Peritoneal Dialysis Comments Tolerated treatment well  Hand-off documentation  Hand-off Given Given to shift RN/LPN  Report given to (Full Name) German Garre, RN

## 2024-10-01 NOTE — Consult Note (Signed)
 Consultation  Referring Provider: Rehab/ [facility] Primary Care Physician:  Gretta Comer POUR, NP Primary Gastroenterologist:  Shila  Reason for Consultation: Anemia, heme positive stool  HPI: [name] is a 58 y.o. 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 She was initially admitted to the hospital on 09/08/2024 with vomiting and diarrhea.  That was after an admission in October 2025 with severe headache nausea vomiting and ataxia.  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  She had been seen by neurosurgery at that time and was discharged home on a Medrol  Dosepak.  With her most recent admission she did have GI consultation on 09/10/2024 merrily at that time for complaints of dysphagia with abnormal barium swallow her diarrhea had resolved at that time and she was being treated empirically for C. difficile, with positive C. difficile antigen.  Nausea and vomiting were also improved at that time. EGD revealed a normal-appearing esophagus, she was empirically dilated to 20 mm balloon the remainder of the study was unremarkable.  She did have biopsies taken of the stomach for mild antral erythema Gastric biopsies negative for H. pylori and showed mild inactive gastritis. She was discharged to home and then readmitted on 09/23/2024 with complaints of severe abdominal pain and back pain, was found to be hypotensive improved with fluids.  Started empirically on broad-spectrum antibiotics due to elevated lactate level, and also noted to have a troponin at 338. CTA did not show any  significant change in her endoluminal graft. To be significantly volume depleted by nephrology.  Tennille fluid was clear so peritonitis felt to be unlikely source for current illness. 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 She was transferred to rehab on 09/27/2024 to generalized debilitation.  Her notes has continued to have problems with nausea back and side pain. She has been on prophylactic subcu heparin . Noted yesterday to have a drop in her hemoglobin without any overt evidence of bleeding. Stool documented Hemoccult positive Hemoglobin 09/27/2024  9.4> 12/13 hemoglobin 8.6 ,09/2014 hemoglobin 7.6  Nephrology started Aranesp  yesterday  Today WBC 15.8/hemoglobin 8.1/hematocrit 23.8/MCV 108/platelets 244 Potassium 3.2/BUN 58/creatinine 12.31 T. bili 1.3/alk phos 317/AST 28/ALT 27  Repeat CT noncontrasted was done yesterday abdomen and pelvis due to drop in hemoglobin noted to have slightly irregular enlarged liver no gallstones normal-appearing spleen, descending thoracic aorta measures up to 5.1 cm with partially imaged endovascular stent graft in the lower descending thoracic aorta.  Colonoscopy had been done in January 2020/[facility]-polyps, diverticulosis only sigmoid colon.  As mentioned above patient did have a C. difficile antigen to on 09/08/2024/toxin negative and then C. difficile by PCR reflex was negative.  As patient was having diarrhea she was started on a course of vancomycin  x 14 days.  She says she was taking that until readmission and had 1 day left of treatment.  Since readmission on 12 8 she has continued to have loose/diarrheal stools with diarrhea after every meal.  She says still somewhat malodorous.  She feels the stool has been dark.  She also had an episode  at  home prior to being readmitted on the eighth with a fall at home and then was noted to have some stool on the floor which her family noted was black.  Stools definitely not black and tarry here but loose and dark.  She continues to feel somewhat gassy and has an unsettled feeling in her abdomen.  No nausea or vomiting.  Appetite okay and able to eat a fair amount.  She feels that she is making progress with physical therapy, still not able to ambulate on her own, requires assistance, and intermittently will feel lightheaded or dizziness with standing  Past Medical History:  Diagnosis Date   Acute pain of right shoulder due to trauma 06/08/2023   Acute pancreatitis 10/23/2021   Acute renal failure superimposed on stage 4 chronic kidney disease (HCC) 10/24/2021   Allergy    Anemia    Normocytic   Antibiotic-induced yeast infection 09/28/2020   Anxiety    Asthma    Bronchitis 2005   Bursitis of left shoulder 09/06/2019   CKD (chronic kidney disease)    Dialysis on Mon-Wed- Fri   CLASS 1-EXOPHTHALMOS-THYROTOXIC 02/08/2007   Cognitive dysfunction 02/21/2020   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  Genes Analyzed (43 total): APC*, ATM*, AXIN2, BARD1, BMPR1A, BRCA1*, BRCA2*, BRIP1*, CDH1*, CDK4, CDKN2A, CHEK2*, DICER1, GALNT12, HOXB13, MEN1, MLH1*, MRE11A, MSH2*, MSH3, MSH6*, MUTYH*, NBN, NF1*, NTHL1, PALB2*,  PMS2*, POLD1, POLE, PTEN*, RAD50, RAD51C*, RAD51D*, RET, SDHB, SDHD, SMAD4, SMARCA4, STK11 and TP53* (sequencing and deletion/duplication); EGFR (s   GERD 07/24/2006   GRAVE'S DISEASE 01/01/2008   History of hidradenitis suppurativa    History of kidney stones    History of thrush    HIV DISEASE 07/24/2006   dx March 05   Hyperlipidemia    HYPERTENSION 07/24/2006   Hyperthyroidism 08/2006   Grave's Disease -diffuse radiotracer uptake 08/25/06 Thyroid  scan-Cold nodule to R lower lobe of thyrorid   Ileus (HCC) 02/14/2023   Left bundle branch block (LBBB)    Malnutrition of moderate degree 02/08/2023   Menometrorrhagia    hx of   Nephrolithiasis    Panniculitis 05/12/2020   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 Completed 2 yrs Prednisone  after lung bx confirmation. No symptoms since then.   Sebaceous cyst 04/21/2020   Suppurative hidradenitis  Thyroid  cancer (HCC)    THYROID  NODULE, RIGHT 02/08/2007    Past Surgical History:  Procedure Laterality Date   AORTIC ARCH DEBRANCHING N/A 08/25/2023   Procedure: AORTIC ARCH DEBRANCHING USING 12X8X8MM HEMASHIELD GRAFT;  Surgeon: Lucas Dorise POUR, MD;  [facility];  Service: Open Heart Surgery;  Laterality: N/A;  with bypasses to the innominate and left common carotid arteries   APPLICATION OF WOUND VAC N/A 01/20/2021   Procedure: APPLICATION OF WOUND VAC;   Surgeon: Lowery Estefana RAMAN, DO;  Location: [facility];  Service: Plastics;  Laterality: N/A;   BREAST EXCISIONAL BIOPSY Right 04/26/2018   right axilla negative   BREAST EXCISIONAL BIOPSY Left 04/26/2018   left axilla negative   BREAST LUMPECTOMY Right 10/03/2018   malignant   BREAST LUMPECTOMY WITH RADIOACTIVE SEED AND SENTINEL LYMPH NODE BIOPSY Right 10/03/2018   Procedure: RIGHT BREAST LUMPECTOMY WITH RADIOACTIVE SEED AND SENTINEL LYMPH NODE MAPPING;  Surgeon: Vanderbilt Ned, MD;  [facility]: [facility];  Service: General;  Laterality: Right;   BREAST SURGERY  1997   Breast Reduction    CAPD INSERTION N/A 12/21/2023   Procedure: LAPAROSCOPIC INSERTION CONTINUOUS AMBULATORY PERITONEAL DIALYSIS  (CAPD) CATHETER; LAPAROSCOPIC OMENTUMPEXY;  Surgeon: Magda Ned SAILOR, MD;  [facility]: [facility];  Service: Vascular;  Laterality: N/A;   CYSTOSCOPY W/ URETERAL STENT REMOVAL  11/09/2012   Procedure: CYSTOSCOPY WITH STENT REMOVAL;  Surgeon: Ricardo Likens, MD;  Location: [facility];  Service: Urology;  Laterality: Right;   CYSTOSCOPY WITH RETROGRADE PYELOGRAM, URETEROSCOPY AND STENT PLACEMENT  11/09/2012   Procedure: CYSTOSCOPY WITH RETROGRADE PYELOGRAM, URETEROSCOPY AND STENT PLACEMENT;  Surgeon: Ricardo Likens, MD;  Location: [facility];  Service: Urology;  Laterality: Left;  LEFT URETEROSCOPY, STONE MANIPULATION, left STENT exchange    CYSTOSCOPY WITH STENT PLACEMENT  10/02/2012   Procedure: CYSTOSCOPY WITH STENT PLACEMENT;  Surgeon: Ricardo Likens, MD;  [facility]: [facility];  Service: Urology;  Laterality: Left;   DEBRIDEMENT AND CLOSURE WOUND N/A 01/20/2021   Procedure: Excision of abdominal wound with closure;  Surgeon: Lowery Estefana RAMAN, DO;  Location: [facility];  Service: Plastics;  Laterality: N/A;   DILATION AND CURETTAGE OF UTERUS  11/2002   s/p for 1st trimester nonviable pregnancy   ESOPHAGOGASTRODUODENOSCOPY N/A 09/11/2024   Procedure: EGD (ESOPHAGOGASTRODUODENOSCOPY);   Surgeon: Abran Norleen SAILOR, MD;  Location: [facility];  Service: Gastroenterology;  Laterality: N/A;   EYE SURGERY     sty under eyelid   INCISE AND DRAIN ABCESS  08/2002   s/p I &D for righ inframmary fold hidradenitis   INCISION AND DRAINAGE PERITONSILLAR ABSCESS  12/2001   IR CV LINE INJECTION  06/07/2018   IR FLUORO GUIDE CV LINE RIGHT  01/30/2023   IR IMAGING GUIDED PORT INSERTION  06/20/2018   IR REMOVAL TUN ACCESS W/ PORT W/O FL MOD SED  06/20/2018   IR US  GUIDE VASC ACCESS RIGHT  01/30/2023   IRRIGATION AND DEBRIDEMENT ABSCESS  01/31/2012   Procedure: IRRIGATION AND DEBRIDEMENT ABSCESS;  Surgeon: Alm VEAR Angle, MD;  [facility];  Service: General;  Laterality: Right;  right breast and axilla    LAPAROSCOPIC REPOSITIONING CAPD CATHETER Left 01/10/2024   Procedure: Removal of peritoneal dialysis Catheter.;  Surgeon: Magda Ned SAILOR, MD;  [facility];  Service: Vascular;  Laterality: Left;   LAPAROSCOPY N/A 01/10/2024   Procedure: LAPAROSCOPY, DIAGNOSTIC;  Surgeon: Magda Ned SAILOR, MD;  Location: [facility] OR;  Service: Vascular;  Laterality: N/A;   NEPHROLITHOTOMY  10/02/2012  Procedure: NEPHROLITHOTOMY PERCUTANEOUS;  Surgeon: Ricardo Likens, MD;  [facility];  Service: Urology;  Laterality: Right;  First Stage Percutaneous Nephrolithotomy with Surgeon Access, Left Ureteral Stent     NEPHROLITHOTOMY  10/04/2012   Procedure: NEPHROLITHOTOMY PERCUTANEOUS SECOND LOOK;  Surgeon: Ricardo Likens, MD;  [facility];  Service: Urology;  Laterality: Right;      NEPHROLITHOTOMY  10/08/2012   Procedure: NEPHROLITHOTOMY PERCUTANEOUS;  Surgeon: Ricardo Likens, MD;  [facility];  Service: Urology;  Laterality: Right;  THIRD STAGE, nephrostomy tube exchange x 2   NEPHROLITHOTOMY  10/11/2012   Procedure: NEPHROLITHOTOMY PERCUTANEOUS SECOND LOOK;  Surgeon: Ricardo Likens, MD;  [facility]: [facility];  Service: Urology;  Laterality: Right;  RIGHT 4 STAGE PERCUTANOUS NEPHROLITHOTOMY, right  URETEROSCOPY WITH HOLMIUM LASER    PANNICULECTOMY N/A 12/21/2020   Procedure: PANNICULECTOMY;  Surgeon: Lowery Estefana RAMAN, DO;  [facility];  Service: Plastics;  Laterality: N/A;   PERCUTANEOUS NEPHROSTOLITHOTOMY  04/2022   PORT-A-CATH REMOVAL N/A 07/16/2020   Procedure: REMOVAL PORT-A-CATH;  Surgeon: Vanderbilt Ned, MD;  Location: [facility];  Service: General;  Laterality: N/A;   PORTACATH PLACEMENT Left 05/17/2018   Procedure: INSERTION PORT-A-CATH;  Surgeon: Vernetta Berg, MD;  Location: [name];  Service: General;  Laterality: Left;   RADICAL NECK DISSECTION  04/12/2017   limited/notes 04/12/2017   RADICAL NECK DISSECTION N/A 04/12/2017   Procedure: RADICAL NECK DISSECTION;  Surgeon: Carlie Clark, MD;  Location: [facility];  Service: ENT;  Laterality: N/A;  limited neck dissection 2 hours total   REDUCTION MAMMAPLASTY Bilateral 1998   RIGHT/LEFT HEART CATH AND CORONARY ANGIOGRAPHY N/A 03/12/2020   Procedure: RIGHT/LEFT HEART CATH AND CORONARY ANGIOGRAPHY;  Surgeon: Cherrie Toribio SAUNDERS, MD;  [facility];  Service: Cardiovascular;  Laterality: N/A;   RIGHT/LEFT HEART CATH AND CORONARY ANGIOGRAPHY N/A 09/26/2024   Procedure: RIGHT/LEFT HEART CATH AND CORONARY ANGIOGRAPHY;  Surgeon: Darron Deatrice LABOR, MD;  [facility];  Service: Cardiovascular;  Laterality: N/A;   [place]  1994   TEE WITHOUT CARDIOVERSION N/A 08/25/2023   Procedure: TRANSESOPHAGEAL ECHOCARDIOGRAM;  Surgeon: Lucas Dorise POUR, MD;  [facility];  Service: Open Heart Surgery;  Laterality: N/A;   TENCKHOFF CATHETER INSERTION Left 01/10/2024   Procedure: INSERTION, CATHETER, DIALYSIS, PERITONEAL;  Surgeon: Magda Ned SAILOR, MD;  [facility];  Service: Vascular;  Laterality: Left;   THORACIC AORTIC ENDOVASCULAR STENT GRAFT N/A 02/02/2023   Procedure: THORACIC AORTIC ENDOVASCULAR STENT GRAFT;  Surgeon: Lanis Fonda BRAVO, MD;  Location: [facility];  Service:  Vascular;  Laterality: N/A;   THORACIC AORTIC ENDOVASCULAR STENT GRAFT N/A 08/25/2023   Procedure: THORACIC AORTIC ENDOVASCULAR STENT GRAFT;  Surgeon: Lanis Fonda BRAVO, MD;  Location: [facility] OR;  Service: Vascular;  Laterality: N/A;   THYROIDECTOMY  04/12/2017   completion/notes 04/12/2017   THYROIDECTOMY N/A 04/12/2017   Procedure: THYROIDECTOMY;  Surgeon: Carlie Clark, MD;  Location: [facility] OR;  Service: ENT;  Laterality: N/A;  Completion Thyroidectomy   TOTAL THYROIDECTOMY  2010   ULTRASOUND GUIDANCE FOR VASCULAR ACCESS Right 08/25/2023   Procedure: ULTRASOUND GUIDANCE FOR VASCULAR ACCESS, RIGHT FEMORAL ARTERY;  Surgeon: Lanis Fonda BRAVO, MD;  Location: [facility] OR;  Service: Vascular;  Laterality: Right;    Prior to Admission medications  Medication Sig Start Date End Date Taking? Authorizing Provider  acetaminophen  (TYLENOL ) 500 MG tablet Take 1,000 mg by mouth every 6 (six) hours as needed for mild pain.    [provider]  allopurinol  (ZYLOPRIM ) 100 MG tablet TAKE 1 TABLET (100 MG TOTAL) BY MOUTH DAILY. FOR GOUT PREVENTION 04/11/24   Clark, Katherine K, NP  aspirin  81 MG chewable tablet Chew 81 mg by mouth daily.    [provider]  AURYXIA  1 GM 210 MG(Fe) tablet Take 210 mg by mouth 3 (three) times daily with meals. 04/05/23   [provider]  busPIRone  (BUSPAR ) 5 MG tablet TAKE 1 TABLET BY MOUTH TWICE A DAY FOR ANXIETY 08/26/24   Gretta Comer POUR, NP  calcitRIOL  (ROCALTROL ) 0.25 MCG capsule Take 0.25 mcg by mouth 3 (three) times a week. Patient not taking: Reported on 09/23/2024 08/07/24   [provider]  dolutegravir  ([name] ) 50 MG tablet Take 1 tablet (50 mg total) by mouth daily. 05/13/24   Melvenia Corean SAILOR, NP  emtricitabine -tenofovir  AF (DESCOVY ) 200-25 MG tablet Take 1 tablet by mouth daily. 05/13/24   Melvenia Corean SAILOR, NP  fludrocortisone  (FLORINEF ) 0.1 MG tablet Take 1 tablet (0.1 mg total) by mouth daily. 09/28/24   Lue Elsie BROCKS, MD   fluticasone  (FLOVENT  HFA) 110 MCG/ACT inhaler Inhale 1 puff into the lungs 2 (two) times daily.    [provider]  hydrocortisone  (CORTEF ) 10 MG tablet Take 2 tablets (20 mg total) by mouth in the morning AND 1 tablet (10 mg total) every evening. 09/27/24 12/26/24  Lue Elsie BROCKS, MD  Lancets [facility] ULTRASOFT) lancets Use as instructed to test blood sugar daily 02/15/24   Clark, Katherine K, NP  levocetirizine (XYZAL ) 5 MG tablet TAKE 1 TABLET BY MOUTH EVERY DAY IN THE EVENING FOR ALLERGIES 05/12/24   Clark, Katherine K, NP  levothyroxine  (SYNTHROID ) 150 MCG tablet TAKE 1 TABLET BY MOUTH EVERY DAY BEFORE BREAKFAST 09/16/24   Trixie File, MD  lidocaine  (LIDODERM ) 5 % PLACE 1 PATCH ONTO THE SKIN DAILY. REMOVE & DISCARD PATCH WITHIN 12 HOURS OR AS DIRECTED BY MD Patient taking differently: Place 1 patch onto the skin daily as needed. Remove & Discard patch within 12 hours or as directed by MD 04/21/24   Gretta Comer POUR, NP  metoprolol  succinate (TOPROL -XL) 25 MG 24 hr tablet Take 0.5 tablets (12.5 mg total) by mouth daily. 09/27/24   Lue Elsie BROCKS, MD  minocycline  (MINOCIN ) 100 MG capsule Take 1 capsule (100 mg total) by mouth 2 (two) times daily. 03/13/24   Melvenia Corean SAILOR, NP  omeprazole  (PRILOSEC) 40 MG capsule Take 1 capsule (40 mg total) by mouth 2 (two) times daily. for heartburn. 09/12/24   Ghimire, Donalda HERO, MD  ondansetron  (ZOFRAN -ODT) 4 MG disintegrating tablet Take 1 tablet (4 mg total) by mouth every 8 (eight) hours as needed for nausea or vomiting. 08/28/24   Clark, Katherine K, NP  rosuvastatin  (CRESTOR ) 10 MG tablet TAKE 1 TABLET BY MOUTH EVERY DAY FOR CHOLESTEROL 03/21/24   Clark, Katherine K, NP  sevelamer  carbonate (RENVELA ) 800 MG tablet Take 800 mg by mouth 3 (three) times daily with meals. 08/07/24   [provider]  tiZANidine  (ZANAFLEX ) 4 MG tablet Take 1-2 tablets (4-8 mg total) by mouth every 8 (eight) hours as needed for muscle spasms.  09/20/24   Gretta Comer POUR, NP    Current Facility-Administered Medications  Medication Dose Route Frequency Provider Last Rate Last Admin   acetaminophen  (TYLENOL ) tablet 325-650 mg  325-650 mg Oral Q4H PRN Jerilynn Daphne SAILOR, NP   650 mg at 09/28/24 1535   allopurinol  (ZYLOPRIM ) tablet 100 mg  100 mg Oral Daily Jerilynn Daphne SAILOR, NP  100 mg at 10/01/24 0901   aspirin  chewable tablet 81 mg  81 mg Oral Daily Jerilynn Daphne SAILOR, NP   81 mg at 10/01/24 9371   bisacodyl  (DULCOLAX) suppository 10 mg  10 mg Rectal Daily PRN Jerilynn Daphne SAILOR, NP       budesonide  (PULMICORT ) nebulizer solution 0.25 mg  0.25 mg Nebulization BID Lawrence, Brandi N, NP   0.25 mg at 10/01/24 9165   busPIRone  (BUSPAR ) tablet 10 mg  10 mg Oral BID PRN Jerilynn Daphne SAILOR, NP   10 mg at 09/30/24 8158   calcitRIOL  (ROCALTROL ) capsule 0.25 mcg  0.25 mcg Oral Once per day on Monday Wednesday Friday Jerilynn Daphne SAILOR, NP   0.25 mcg at 09/30/24 9167   cetaphil lotion   Topical PRN Jerilynn Daphne SAILOR, NP       Chlorhexidine  Gluconate Cloth 2 % PADS 6 each  6 each Topical Q12H [facility], Megan, MD   6 each at 10/01/24 9371   Darbepoetin Alfa  (ARANESP ) injection 100 mcg  100 mcg Subcutaneous Q Mon-1800 Lenon Charmaine BRAVO, NP   100 mcg at 09/30/24 8158   dialysis solution 1.5% low-MG/low-CA dianeal  solution   Intraperitoneal Q24H Jerilynn Daphne SAILOR, NP       diphenhydrAMINE  (BENADRYL ) capsule 25 mg  25 mg Oral Q6H PRN Jerilynn Daphne SAILOR, NP       dolutegravir  ([name] ) tablet 50 mg  50 mg Oral Daily Jerilynn Daphne SAILOR, NP   50 mg at 10/01/24 0900   emtricitabine -tenofovir  AF (DESCOVY ) 200-25 MG per tablet 1 tablet  1 tablet Oral Daily Jerilynn Daphne SAILOR, NP   1 tablet at 10/01/24 0900   feeding supplement (ENSURE PLUS HIGH PROTEIN) liquid 237 mL  237 mL Oral BID BM Jerilynn Daphne SAILOR, NP   237 mL at 09/29/24 0912   fludrocortisone  (FLORINEF ) tablet 0.1 mg  0.1 mg Oral Daily Jerilynn Daphne SAILOR, NP   0.1 mg at 10/01/24 0900    gentamicin  cream (GARAMYCIN ) 0.1 % 1 Application  1 Application Topical Daily Collins, Samantha G, PA-C   1 Application at 10/01/24 9092   guaiFENesin -dextromethorphan  (ROBITUSSIN DM) 100-10 MG/5ML syrup 5-10 mL  5-10 mL Oral Q6H PRN Jerilynn Daphne SAILOR, NP       hydrocortisone  sodium succinate  (SOLU-CORTEF ) 100 MG injection 50 mg  50 mg Intravenous Q12H Jerilynn Daphne SAILOR, NP   50 mg at 10/01/24 9097   insulin  aspart (novoLOG ) injection 0-5 Units  0-5 Units Subcutaneous QHS [address], [place], PA-C   2 Units at 09/28/24 2125   insulin  aspart (novoLOG ) injection 0-6 Units  0-6 Units Subcutaneous TID [address], [place], PA-C   1 Units at 09/30/24 1841   iohexol  (OMNIPAQUE ) 350 MG/ML injection 75 mL  75 mL Intravenous Once PRN Lovorn, Megan, MD       levothyroxine  (SYNTHROID ) tablet 150 mcg  150 mcg Oral Q0600 Jerilynn Daphne SAILOR, NP   150 mcg at 10/01/24 0627   lidocaine  (LIDODERM ) 5 % 1 patch  1 patch Transdermal Q24H Jerilynn Daphne SAILOR, NP   1 patch at 09/27/24 2116   loratadine  (CLARITIN ) tablet 10 mg  10 mg Oral QHS Jerilynn Daphne SAILOR, NP   10 mg at 09/30/24 2110   metoprolol  succinate (TOPROL -XL) 24 hr tablet 12.5 mg  12.5 mg Oral Daily Jerilynn Daphne SAILOR, NP   12.5 mg at 10/01/24 0900   milk and molasses enema  1 enema Rectal Daily PRN Jerilynn Daphne SAILOR, NP       mirtazapine  (REMERON )  tablet 7.5 mg  7.5 mg Oral QHS Jerilynn Daphne SAILOR, NP   7.5 mg at 09/30/24 2110   multivitamin (RENA-VIT) tablet 1 tablet  1 tablet Oral QHS Jerilynn Daphne SAILOR, NP   1 tablet at 09/30/24 2110   Oral care mouth rinse  15 mL Mouth Rinse PRN Jerilynn Daphne SAILOR, NP       oxyCODONE  (Oxy IR/ROXICODONE ) immediate release tablet 5 mg  5 mg Oral Q6H PRN Jerilynn Daphne SAILOR, NP   5 mg at 09/30/24 2111   pantoprazole  (PROTONIX ) EC tablet 40 mg  40 mg Oral Daily Jerilynn Daphne SAILOR, NP   40 mg at 10/01/24 9140   prochlorperazine  (COMPAZINE ) tablet 5-10 mg  5-10 mg Oral Q6H PRN Jerilynn Daphne SAILOR, NP   10 mg at 09/29/24 8067   Or    prochlorperazine  (COMPAZINE ) suppository 12.5 mg  12.5 mg Rectal Q6H PRN Jerilynn Daphne SAILOR, NP       Or   prochlorperazine  (COMPAZINE ) injection 5-10 mg  5-10 mg Intravenous Q6H PRN Jerilynn Daphne SAILOR, NP   10 mg at 09/30/24 8158   rosuvastatin  (CRESTOR ) tablet 10 mg  10 mg Oral Daily Jerilynn Daphne SAILOR, NP   10 mg at 10/01/24 0901   saccharomyces boulardii (FLORASTOR) capsule 250 mg  250 mg Oral Daily Urbano Albright, MD   250 mg at 10/01/24 0901   sevelamer  carbonate (RENVELA ) tablet 800 mg  800 mg Oral TID WC Collins, Samantha G, PA-C   800 mg at 10/01/24 9371   simethicone  (MYLICON) chewable tablet 80 mg  80 mg Oral QID PRN Jerilynn Daphne SAILOR, NP       sodium chloride  flush (NS) 0.9 % injection 10-40 mL  10-40 mL Intracatheter PRN Lovorn, Megan, MD   10 mL at 09/27/24 2116   traZODone  (DESYREL ) tablet 25-50 mg  25-50 mg Oral QHS PRN Jerilynn Daphne SAILOR, NP   50 mg at 09/30/24 2111    Allergies as of 09/27/2024 - Review Complete 09/27/2024  Allergen Reaction Noted   Genvoya [elviteg-cobic-emtricit-tenofaf] Hives 06/09/2015   Lisinopril Cough 02/14/2011   Aldactone  [spironolactone ] Hives 01/29/2023   Valium  [diazepam ] Other (See Comments) 08/01/2024   Tegaderm ag mesh [silver] Itching 10/23/2021    Family History  Problem Relation Age of Onset   Hypertension Mother    Cancer Mother        laryngeal   Heart disease Mother        stent   Pancreatic cancer Father    Hypertension Father    Lung cancer Father 66       hx smoking   Hypertension Sister    Hypertension Sister    Hypertension Brother    Breast cancer Maternal Aunt 32   Breast cancer Maternal Aunt        dx 60+   Cancer Maternal Uncle        Lung CA   Breast cancer Paternal Aunt 67   Breast cancer Paternal Aunt        dx 52's   Breast cancer Paternal Aunt        dx 50's   Prostate cancer Paternal Uncle    Prostate cancer Paternal Uncle    Lung cancer Paternal Uncle    Breast cancer Cousin 41   Breast  cancer Cousin        dx <50   Breast cancer Cousin        dx <50   Breast cancer Cousin  dx <50   Heart disease Other    Hypertension Other    Stroke Other        Grandparent   Kidney disease Other        Grandparent   Diabetes Other        FH of Diabetes   Colon cancer Neg Hx    Esophageal cancer Neg Hx    Rectal cancer Neg Hx    Stomach cancer Neg Hx     Social History   Socioeconomic History   Marital status: Single    Spouse name: Not on file   Number of children: 1   Years of education: Not on file   Highest education level: Not on file  Occupational History   Occupation: Investment Banker, Corporate  Tobacco Use   Smoking status: Former    Current packs/day: 0.00    Average packs/day: 0.5 packs/day for 15.0 years (7.5 ttl pk-yrs)    Types: Cigarettes    Start date: 04/12/2017    Quit date: 2019    Years since quitting: 6.9   Smokeless tobacco: Never  Vaping Use   Vaping status: Never Used  Substance and Sexual Activity   Alcohol  use: Not Currently    Comment: occasional wine   Drug use: No   Sexual activity: Not Currently    Birth control/protection: Post-menopausal  Other Topics Concern   Not on file  Social History Narrative   Right Handed    Lives in a two story home   Social Drivers of Health   Tobacco Use: Medium Risk (09/27/2024)   Patient History    Smoking Tobacco Use: Former    Smokeless Tobacco Use: Never    Passive Exposure: Not on file  Financial Resource Strain: Medium Risk (06/27/2024)   Overall Financial Resource Strain (CARDIA)    Difficulty of Paying Living Expenses: Somewhat hard  Food Insecurity: No Food Insecurity (09/26/2024)   Epic    Worried About Radiation Protection Practitioner of Food in the Last Year: Never true    Ran Out of Food in the Last Year: Never true  Transportation Needs: No Transportation Needs (09/26/2024)   Epic    Lack of Transportation (Medical): No    Lack of Transportation (Non-Medical): No  Physical Activity: Inactive  (06/27/2024)   Exercise Vital Sign    Days of Exercise per Week: 0 days    Minutes of Exercise per Session: 0 min  Stress: Stress Concern Present (06/27/2024)   [employer] of Occupational Health - Occupational Stress Questionnaire    Feeling of Stress: To some extent  Social Connections: Unknown (06/27/2024)   Social Connection and Isolation Panel    Frequency of Communication with Friends and Family: More than three times a week    Frequency of Social Gatherings with Friends and Family: Three times a week    Attends Religious Services: More than 4 times per year    Active Member of Clubs or Organizations: No    Attends Banker Meetings: Never    Marital Status: Not on file  Intimate Partner Violence: Not At Risk (09/26/2024)   Epic    Fear of Current or Ex-Partner: No    Emotionally Abused: No    Physically Abused: No    Sexually Abused: No  Depression (PHQ2-9): Low Risk (09/20/2024)   Depression (PHQ2-9)    PHQ-2 Score: 0  Alcohol  Screen: Low Risk (06/27/2024)   Alcohol  Screen    Last Alcohol  Screening Score (AUDIT): 0  Housing: Low Risk (  09/26/2024)   Epic    Unable to Pay for Housing in the Last Year: No    Number of Times Moved in the Last Year: 0    Homeless in the Last Year: No  Utilities: Not At Risk (09/26/2024)   Epic    Threatened with loss of utilities: No  Health Literacy: Adequate Health Literacy (06/27/2024)   B1300 Health Literacy    Frequency of need for help with medical instructions: Never    Review of Systems: Pertinent positive and negative review of systems were noted in the above HPI section.  All other review of systems was otherwise negative.   Physical Exam: Vital signs in last 24 hours: Temp:  [97.8 F (36.6 C)-98.5 F (36.9 C)] 98.5 F (36.9 C) (12/16 0517) Pulse Rate:  [85-90] 85 (12/16 0900) Resp:  [17-18] 17 (12/16 0517) BP: (127-143)/(64-71) 129/64 (12/16 0900) SpO2:  [97 %-100 %] 98 % (12/16 0834) Weight:  [79.2 kg]  79.2 kg (12/15 1400) Last BM Date : 09/30/24 General:   Alert,  Well-developed, chronically ill-appearing older African-American female pleasant and cooperative in NAD, sitting up in a wheelchair Head:  Normocephalic and atraumatic. Eyes:  Sclera clear, no icterus.   Conjunctiva pink. Ears:  Normal auditory acuity. Nose:  No deformity, discharge,  or lesions. Mouth:  No deformity or lesions.   Neck:  Supple; no masses or thyromegaly. Lungs:  Clear throughout to auscultation.   No wheezes, crackles, or rhonchi.  Heart:  Regular rate and rhythm; no murmurs, clicks, rubs,  or gallops. Abdomen:  Soft, nondistended, no focal tenderness, bowel sounds somewhat hyperactive, peritoneal dialysis catheter in place Rectal: Not done, documented heme positive yesterday Msk:  Symmetrical without gross deformities. . Pulses:  Normal pulses noted. Extremities:  Without clubbing or edema. Neurologic:  Alert and  oriented x4;  grossly normal neurologically. Skin:  Intact without significant lesions or rashes.. Psych:  Alert and cooperative. Normal mood and affect.  Intake/Output from previous day: 12/15 0701 - 12/16 0700 In: 476 [P.O.:476] Out: 289  Intake/Output this shift: No intake/output data recorded.  Lab Results: Recent Labs    09/30/24 0402 10/01/24 0428  WBC 15.3* 15.8*  HGB 7.6* 8.1*  HCT 22.1* 23.8*  PLT 230 244   BMET Recent Labs    09/30/24 0402 10/01/24 0428  NA 133* 137  K 3.9 3.2*  CL 94* 94*  CO2 22 24  GLUCOSE 83 93  BUN 57* 58*  CREATININE 12.56* 12.31*  CALCIUM  7.8* 8.5*   LFT Recent Labs    10/01/24 0428  PROT 6.1*  ALBUMIN  2.8*  AST 28  ALT 27  ALKPHOS 317*  BILITOT 1.3*   PT/INR No results for input(s): LABPROT, INR in the last 72 hours. Hepatitis Panel No results for input(s): HEPBSAG, HCVAB, HEPAIGM, HEPBIGM in the last 72 hours.    IMPRESSION:  #44 58 year old African-American female with complicated recent history and multiple  serious comorbidities who we are asked to evaluate for anemia, acute on chronic with drift in hemoglobin, and heme positive stool.  Patient had recent EGD November 2026 for complaints of dysphagia and was found to have a normal-appearing esophagus but was empirically dilated, and very mild gastritis.  She reports that she had a dark blackish stool noted at home prior to this most recent readmission on 09/23/2024 after she had fallen.  She has also just completed treatment for presumed C. difficile colitis.  She had been having diarrhea during her last admission and had  a positive C. difficile antigen, toxin negative and C. difficile PCR negative but completed a 14-day course of vancomycin  (had 1 day left of treatment at the time of readmission). Has continued to have diarrheal stools since readmission usually at least 3 times daily occurring urgently at times postprandial.  Says stool still looks darkish, and she has been noticing some bright red blood on the tissue with wiping. Certainly stool may be positive in the setting of diarrheal illness/C. difficile, and from local anal rectal irritation with diarrhea.  I am concerned that diarrhea is persisting-Will need to rule out refractory low-grade C. difficile.  Which also could explain her rising WBC.  I think her anemia is definitely multifactoral with acute illness, and chronic renal failure, and doubt active GI bleeding.  #2 HIV #3 end-stage renal disease on peritoneal dialysis nightly-baseline creatinine in the 12 range #4 diabetes mellitus 5.  Asthma 6.  History of previous aortic dissection status post endovascular repair 7.  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  Peripheral vascular disease 9.  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 difficile quick screen to be repeated, may need PCR She may benefit from colonoscopy prior to discharge from the hospital though I think she would have a difficult time prepping at present, and will need to exclude any component of active C. difficile colitis prior to making decision about timing of colonoscopy. GI will follow along with you   Taliyah Watrous EsterwoodPA-C  10/01/2024, 10:16 AM

## 2024-10-01 NOTE — Progress Notes (Addendum)
 [facility] Progress Note   Subjective:    Seen and examined patient on rehab unit. She's currently working with PT. CT completed and blood cultures obtained overnight. She reports feeling better after PD overnight. She denies SOB, ABD pain, and N/V. Plan for PD again tonight.  Objective Vitals:   09/30/24 1957 10/01/24 0517 10/01/24 0834 10/01/24 0900  BP: 138/71 127/65  129/64  Pulse: 85 90  85  Resp: 18 17    Temp: 97.8 F (36.6 C) 98.5 F (36.9 C)    TempSrc:      SpO2: 100% 97% 98%   Weight:      Height:       Physical Exam General: Alert female in NAD Heart: RRR, no murmurs Lungs: CTA bilaterally, respirations unlabored Abdomen: Soft, non-distended, +BS Extremities: No edema b/l lower extremities Dialysis Access:  PD cath hooked up to cycler  Filed Weights   09/28/24 0635 09/29/24 0628 09/30/24 1400  Weight: 77.9 kg 78.1 kg 79.2 kg    Intake/Output Summary (Last 24 hours) at 10/01/2024 1219 Last data filed at 10/01/2024 0700 Gross per 24 hour  Intake 476 ml  Output 289 ml  Net 187 ml    Additional Objective Labs: Basic Metabolic Panel: Recent Labs  Lab 09/28/24 0500 09/29/24 0434 09/30/24 0402 10/01/24 0428  NA 135  --  133* 137  K 3.9  --  3.9 3.2*  CL 94*  --  94* 94*  CO2 23  --  22 24  GLUCOSE 110*  --  83 93  BUN 43*  --  57* 58*  CREATININE 11.90*  --  12.56* 12.31*  CALCIUM  8.5*  --  7.8* 8.5*  PHOS 7.1* 7.0* 8.4* 7.5*   Liver Function Tests: Recent Labs  Lab 09/28/24 0500 10/01/24 0428  AST 34 28  ALT 20 27  ALKPHOS 374* 317*  BILITOT 1.0 1.3*  PROT 6.0* 6.1*  ALBUMIN  2.7* 2.8*   No results for input(s): LIPASE, AMYLASE in the last 168 hours. CBC: Recent Labs  Lab 09/26/24 0532 09/26/24 1454 09/27/24 0356 09/28/24 0500 09/30/24 0402 10/01/24 0428  WBC 16.1*  --  12.9* 13.2* 15.3* 15.8*  NEUTROABS  --   --   --  11.9*  --  13.3*  HGB 9.8*   < > 9.4* 8.6* 7.6* 8.1*  HCT 28.8*   < > 27.8* 25.5* 22.1*  23.8*  MCV 108.7*  --  108.2* 106.3* 105.7* 108.2*  PLT 226  --  247 233 230 244   < > = values in this interval not displayed.   Blood Culture    Component Value Date/Time   SDES PERITONEAL DIALYSATE 09/24/2024 0700   SPECREQUEST Immunocompromised ABDOMINAL 09/24/2024 0700   CULT  09/24/2024 0700    NO GROWTH 3 DAYS Performed at [facility], [address] [place], [place] [address]    REPTSTATUS 09/27/2024 FINAL 09/24/2024 0700    Cardiac Enzymes: No results for input(s): CKTOTAL, CKMB, CKMBINDEX, TROPONINI in the last 168 hours. CBG: Recent Labs  Lab 09/30/24 1109 09/30/24 1702 09/30/24 2050 10/01/24 0615 10/01/24 1139  GLUCAP 103* 169* 146* 80 116*   Iron  Studies: No results for input(s): IRON , TIBC, TRANSFERRIN, FERRITIN in the last 72 hours. Lab Results  Component Value Date   INR 1.1 09/23/2024   INR 1.4 (H) 08/29/2023   INR 1.4 (H) 08/28/2023   Studies/Results: DG Chest 2 View Result Date: 09/30/2024 EXAM: 2 VIEW(S) XRAY OF THE CHEST 09/30/2024 01:30:00  PM COMPARISON: Comparison with 09/23/2024. CLINICAL HISTORY: Leukocytosis. FINDINGS: LINES, TUBES AND DEVICES: Right central venous catheter has been placed with tip projecting over the mid SVC region. LUNGS AND PLEURA: Shallow inspiration. No focal pulmonary opacity. No pleural effusion. No pneumothorax. HEART AND MEDIASTINUM: Postoperative changes with sternotomy wires and aortic stent graft. Heart size and pulmonary vascularity are normal. BONES AND SOFT TISSUES: Surgical clips in the base of the neck. No acute osseous abnormality. IMPRESSION: 1. Right central venous catheter with tip over the mid SVC, without pneumothorax. Electronically signed by: Elsie Gravely MD 09/30/2024 01:50 PM EST RP Workstation: [id number]   CT ABDOMEN PELVIS WO CONTRAST Result Date: 09/30/2024 CLINICAL DATA:  Anemia, history of aortic dissection. End-stage renal disease. EXAM: CT ABDOMEN AND PELVIS WITHOUT  CONTRAST TECHNIQUE: Multidetector CT imaging of the abdomen and pelvis was performed following the standard protocol without IV contrast. RADIATION DOSE REDUCTION: This exam was performed according to the departmental dose-optimization program which includes automated exposure control, adjustment of the mA and/or kV according to patient size and/or use of iterative reconstruction technique. COMPARISON:  CT abdomen pelvis 09/07/2024. FINDINGS: Lower chest: Bibasilar scarring and volume loss. Partially imaged endovascular stent graft in the lower descending thoracic aorta, extending to the diaphragmatic hiatus. Descending thoracic aorta measures up to approximately 5.1 cm. Heart is enlarged. Age advanced right coronary artery calcification. No pericardial or pleural effusion. Distal esophagus is grossly unremarkable. Hepatobiliary: Liver margin is slightly irregular and the liver is enlarged, 18.2 cm. Noncalcified gallstones. Tiny perihepatic ascites. No biliary ductal dilatation. Pancreas: Negative. Spleen: Negative. Adrenals/Urinary Tract: Right adrenal gland is unremarkable. Left adrenal adenoma. No specific follow-up necessary. Kidneys are mildly atrophic. Excreted contrast in the right renal collecting system related to CT angiography 09/23/2024. Bilateral renal stones and/or renal vascular calcifications. Ureters are decompressed. Contrast is seen in a low volume bladder. Stomach/Bowel: Stomach, small bowel, appendix and colon are unremarkable. Vascular/Lymphatic: Atherosclerotic calcification of the aorta. No pathologically enlarged lymph nodes. Calcified dissection flap in the aorta. Further assessment is limited without IV contrast. Reproductive: No adnexal mass. Other: Small pelvic free fluid. Percutaneous dialysis catheter terminates in the anatomic pelvis. Small umbilical hernia contains fat. Small bilateral inguinal hernias contain fat. Musculoskeletal: Degenerative changes in the spine. IMPRESSION: 1. No  acute findings. 2. Age advanced right coronary artery calcification. 3. Cirrhosis. 4. Cholelithiasis. 5. Minimal ascites with peritoneal dialysis catheter in place. 6. Left adrenal adenoma. 7. Bilateral renal stones and/or renal vascular calcifications. 8. Partially visualized endovascular stent graft in an aneurysmal descending thoracic aorta, with a chronic dissection flap in the abdominal aorta. 9.  Aortic atherosclerosis (ICD10-I70.0). Electronically Signed   By: Newell Eke M.D.   On: 09/30/2024 10:44    Medications:  dialysis solution 1.5% low-MG/low-CA      allopurinol   100 mg Oral Daily   aspirin   81 mg Oral Daily   budesonide  (PULMICORT ) nebulizer solution  0.25 mg Nebulization BID   calcitRIOL   0.25 mcg Oral Once per day on Monday Wednesday Friday   Chlorhexidine  Gluconate Cloth  6 each Topical Q12H   darbepoetin (ARANESP ) injection - DIALYSIS  100 mcg Subcutaneous Q Mon-1800   dolutegravir   50 mg Oral Daily   emtricitabine -tenofovir  AF  1 tablet Oral Daily   feeding supplement  237 mL Oral BID BM   fludrocortisone   0.1 mg Oral Daily   gentamicin  cream  1 Application Topical Daily   hydrocortisone  sod succinate (SOLU-CORTEF ) inj  50 mg Intravenous Q12H   insulin  aspart  0-5 Units Subcutaneous QHS   insulin  aspart  0-6 Units Subcutaneous TID WC   levothyroxine   150 mcg Oral Q0600   lidocaine   1 patch Transdermal Q24H   loratadine   10 mg Oral QHS   metoprolol  succinate  12.5 mg Oral Daily   mirtazapine   7.5 mg Oral QHS   multivitamin  1 tablet Oral QHS   nystatin   5 mL Oral QID   pantoprazole   40 mg Oral Daily   rosuvastatin   10 mg Oral Daily   saccharomyces boulardii  250 mg Oral Daily   sevelamer  carbonate  800 mg Oral TID WC    Dialysis Orders: [facility] 7x week-[facility] 5 exchanges, fill vol 2250, 1.5hr dwell time, edw 74.5kg  Usually does 1/2 1.5% bags and 1/2 2.5% bags   CXR 12/08: no active disease  Assessment/Plan: Leukocytosis: Discussed with the  primary team: WBCs are going up. CT ABD/pelvis showed no acute findings and blood cultures obtained overnight-results pending.   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 BP's better. Now in CIR.  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 Cardiology consulting, w/ sig WMA's they are planning RHC/ LHC. Appreciate assitance.  Euvolemic on exam today, but she reported feeling full today. Managing volume with dialysis.  ESRD: on CCPD. Plan for PD tonight. Will use 1/2 1.5% bags and 1/2 2.5% bags per her usual regimen. Volume: no signs of vol overload, admit CXR was negative.  Anemia of esrd: Hgbs usually 10-11s in outpatient but now dropping-Hgb now 8.1. Now on ESA. Noted hemoccult is positive. D/w the primary team who has re-consulted GI. Transfuse for Hgb < 7. Tremors: discontinued gabapentin  and this is a cause of tremors/ asterixis in ESRD pts. Improved Secondary hyperparathyroidism: Calcium  contorlled, phos elevated. Continue calcitriol . Resumed renvela .   Charmaine Piety, NP [facility] 10/01/2024,12:19 PM  LOS: 4 days

## 2024-10-01 NOTE — Progress Notes (Signed)
 Patient ID: [name], female   DOB: April 27, 1966, 58 y.o.   MRN: [id number]  Met with pt to discuss team conference with goals of mod/I-supervision level and target discharge date of 12/23. She is doing better and feels by this time she will be ready to go home. Discussed equipment needs she does have a rolling walker and really needs a tub bench which I informed her is private pay and better to get on her own. She reports [facility] was coming out to provide home health prior to admission will get them to resume services. Will reach out to them to make sure still can provide services.

## 2024-10-01 NOTE — Progress Notes (Signed)
 Occupational Therapy Note  Patient Details  Name: [name] MRN: [id number] Date of Birth: 04/05/1966  Occupational Therapist participated in the interdisciplinary team conference, providing clinical information regarding the patients current status, treatment goals, and weekly focus, including any barriers that need to be addressed. Please see the Inpatient Rehabilitation Team Conference and Plan of Care Update for further details.  Safina Huard Woods-Chance, MS, OTR/L 10/01/2024, 11:44 AM

## 2024-10-02 ENCOUNTER — Ambulatory Visit: Admitting: Primary Care

## 2024-10-02 DIAGNOSIS — R5381 Other malaise: Secondary | ICD-10-CM | POA: Diagnosis not present

## 2024-10-02 LAB — CBC
HCT: 24.1 % — ABNORMAL LOW (ref 36.0–46.0)
Hemoglobin: 8.2 g/dL — ABNORMAL LOW (ref 12.0–15.0)
MCH: 36.6 pg — ABNORMAL HIGH (ref 26.0–34.0)
MCHC: 34 g/dL (ref 30.0–36.0)
MCV: 107.6 fL — ABNORMAL HIGH (ref 80.0–100.0)
Platelets: 212 K/uL (ref 150–400)
RBC: 2.24 MIL/uL — ABNORMAL LOW (ref 3.87–5.11)
RDW: 15.9 % — ABNORMAL HIGH (ref 11.5–15.5)
WBC: 17.6 K/uL — ABNORMAL HIGH (ref 4.0–10.5)
nRBC: 2.9 % — ABNORMAL HIGH (ref 0.0–0.2)

## 2024-10-02 LAB — GLUCOSE, CAPILLARY
Glucose-Capillary: 118 mg/dL — ABNORMAL HIGH (ref 70–99)
Glucose-Capillary: 124 mg/dL — ABNORMAL HIGH (ref 70–99)
Glucose-Capillary: 146 mg/dL — ABNORMAL HIGH (ref 70–99)
Glucose-Capillary: 192 mg/dL — ABNORMAL HIGH (ref 70–99)
Glucose-Capillary: 119 mg/dL — ABNORMAL HIGH (ref 70–99)

## 2024-10-02 LAB — LACTIC ACID, PLASMA: Lactic Acid, Venous: 1.3 mmol/L (ref 0.5–1.9)

## 2024-10-02 LAB — PROCALCITONIN: Procalcitonin: 1.42 ng/mL

## 2024-10-02 LAB — PHOSPHORUS: Phosphorus: 6.8 mg/dL — ABNORMAL HIGH (ref 2.5–4.6)

## 2024-10-02 LAB — OCCULT BLOOD X 1 CARD TO LAB, STOOL: Fecal Occult Bld: POSITIVE — AB

## 2024-10-02 LAB — MAGNESIUM: Magnesium: 2.1 mg/dL (ref 1.7–2.4)

## 2024-10-02 MED ORDER — MELATONIN 5 MG PO TABS
5.0000 mg | ORAL_TABLET | Freq: Every day | ORAL | Status: DC
  Administered 2024-10-02 – 2024-10-05 (×4): 5 mg via ORAL
  Filled 2024-10-02 (×4): qty 1

## 2024-10-02 MED ORDER — SEVELAMER CARBONATE 800 MG PO TABS
1600.0000 mg | ORAL_TABLET | Freq: Three times a day (TID) | ORAL | Status: DC
  Administered 2024-10-02 – 2024-10-08 (×15): 1600 mg via ORAL
  Filled 2024-10-02 (×19): qty 2

## 2024-10-02 MED ORDER — TRAZODONE HCL 50 MG PO TABS
25.0000 mg | ORAL_TABLET | Freq: Every evening | ORAL | Status: DC | PRN
  Administered 2024-10-03 – 2024-10-05 (×3): 25 mg via ORAL
  Filled 2024-10-02 (×3): qty 1

## 2024-10-02 MED ORDER — GENTAMICIN SULFATE 0.1 % EX CREA
1.0000 | TOPICAL_CREAM | Freq: Every day | CUTANEOUS | Status: DC
  Administered 2024-10-03: 08:00:00 1 via TOPICAL
  Filled 2024-10-02: qty 15

## 2024-10-02 NOTE — Progress Notes (Signed)
 Physical Therapy Session Note  Patient Details  Name: [name] MRN: [id number] Date of Birth: 05/28/1966  Today's Date: 10/02/2024 PT Individual Time: 9053-8957, 8543-8461 PT Individual Time Calculation (min): 56 min, 42 min  Short Term Goals: Week 1:  PT Short Term Goal 1 (Week 1): Pt will perform all standing transfers with SBA. PT Short Term Goal 2 (Week 1): Pt will ambulate at least 60 ft per bout using LRAD with overall SBA demonstrating improved activity tolerance. PT Short Term Goal 3 (Week 1): Pt will initiate stair training. PT Short Term Goal 4 (Week 1): Pt will perform appropriate outcome measure.  Skilled Therapeutic Interventions/Progress Updates:      Treatment Session 1  Pt supine in bed. Pt agreeable to therapy. Pt denies any pain. Pt endorses increased fatigue 2/2 inability to sleep last night. Pt agreeable to bed level therapy.   Pt performed the following bed level therapy for B LE strengthening/activity tolerance/endurance - verbal cues provided for LE muscle activation versus compensating with use of UE to assist.   1x10 B LE AA SLR  1x10 B LE heel slides - verbal cues provided for slow and controlled lowering versus plopping  1x10 glute bridge 5 hold  1x10 knee fall outs  1x10 sidelying clamshells B  1x20 ankle pumps   1x10 LAQ B  1x10 seated marching B; verbal cues provided for comepnsation reduction especially with L LE 1x20 seated heel/toe raises  Pt performed supine to sit with supervision and increased time, verbal cues provided for log roll technique for improved ease. Pt performed sit to supine with long sitting technique with supervision.   Pt supine in bed at end of session with all needs within reach and bed alarm on.   Treatment Session 2   Pt seated in WC upon arrival. Pt agreeable to therapy. Pt denies any pain.   Pt endorsing need to go to cafeteria to get food 2/2 poor appetite.   Pt transported dependent in WC to  cafeteria. Pt self propelled WC throughout cafeteria with B UE and min A, for navigating turns and obstacles, max verbal cues provided for technique and sequencing.   Pt ambulated x50 feet with no AD and R HHA, verabl cues provided for reciprocal gait.   Pt navigated 12 6 inch steps with B handrails and recirpocal gait with CGA, verbal cues provided for pursed lip breathing.   Pt ambulated short distance in room with RW and CGA to sink to wash hands, verbal cues provided for sequencing with navigating turn away from sink with emphasis on safety with RW.   PT recommended pt sit up in chair after session to eat her food, with PT encouraging OOB tolerance. Pt verbalized understanding and agreeable.   Pt seated in [facility] with all needs within reach and seatbelt alarm on.   Therapy Documentation Precautions:  Precautions Precautions: Fall Recall of Precautions/Restrictions: Intact Precaution/Restrictions Comments: PD catheter at mid lower abdomen, triple lumen central line in R jugular, on ART for HIV(+) Restrictions Weight Bearing Restrictions Per Provider Order: No   Therapy/Group: Individual Therapy  [facility] [name], [place], DPT  10/02/2024, 7:53 AM

## 2024-10-02 NOTE — Progress Notes (Signed)
 Patient ID: [name], female   DOB: April 08, 1966, 58 y.o.   MRN: [id number]    Progress Note   Subjective  Rehab day #4 CC;-anemia with drifting hemoglobin, heme positive stool recent treatment for probable C. difficile colitis  C. difficile quick screen pending, not done as yet as no further bowel movements WBC 17.6/hemoglobin 8.2/hematocrit 24.1 (stable)  Patient sitting up in bed, asking if the C. difficile precautions can be discontinued as she would like to go to the cafeteria with her sister for lunch.  No bowel movement yesterday or today    Objective   Vital signs in last 24 hours: Temp:  [98 F (36.7 C)-98.6 F (37 C)] 98 F (36.7 C) (12/17 0459) Pulse Rate:  [77-92] 92 (12/17 0916) Resp:  [16-18] 16 (12/17 0916) BP: (104-135)/(52-74) 109/57 (12/17 0851) SpO2:  [99 %-100 %] 99 % (12/17 0916) Weight:  [77.1 kg-78.7 kg] 78.7 kg (12/17 0500) Last BM Date : 09/30/24 General:    Older African-American female no acute distress, pleasant Heart:  Regular rate and rhythm; no murmurs Lungs: Respirations even and unlabored, lungs CTA bilaterally Abdomen:  Soft, nontender and nondistended. Normal bowel sounds. Extremities:  Without edema. Neurologic:  Alert and oriented,  grossly normal neurologically. Psych:  Cooperative. Normal mood and affect.  Intake/Output from previous day: 12/16 0701 - 12/17 0700 In: 592 [P.O.:592] Out: -  Intake/Output this shift: Total I/O In: -  Out: 564 [Other:564]  Lab Results: Recent Labs    09/30/24 0402 10/01/24 0428 10/02/24 0949  WBC 15.3* 15.8* 17.6*  HGB 7.6* 8.1* 8.2*  HCT 22.1* 23.8* 24.1*  PLT 230 244 212   BMET Recent Labs    09/30/24 0402 10/01/24 0428  NA 133* 137  K 3.9 3.2*  CL 94* 94*  CO2 22 24  GLUCOSE 83 93  BUN 57* 58*  CREATININE 12.56* 12.31*  CALCIUM  7.8* 8.5*   LFT Recent Labs    10/01/24 0428  PROT 6.1*  ALBUMIN  2.8*  AST 28  ALT 27  ALKPHOS 317*  BILITOT 1.3*   PT/INR No  results for input(s): LABPROT, INR in the last 72 hours.  Studies/Results: DG Chest 2 View Result Date: 09/30/2024 EXAM: 2 VIEW(S) XRAY OF THE CHEST 09/30/2024 01:30:00 PM COMPARISON: Comparison with 09/23/2024. CLINICAL HISTORY: Leukocytosis. FINDINGS: LINES, TUBES AND DEVICES: Right central venous catheter has been placed with tip projecting over the mid SVC region. LUNGS AND PLEURA: Shallow inspiration. No focal pulmonary opacity. No pleural effusion. No pneumothorax. HEART AND MEDIASTINUM: Postoperative changes with sternotomy wires and aortic stent graft. Heart size and pulmonary vascularity are normal. BONES AND SOFT TISSUES: Surgical clips in the base of the neck. No acute osseous abnormality. IMPRESSION: 1. Right central venous catheter with tip over the mid SVC, without pneumothorax. Electronically signed by: Elsie Gravely MD 09/30/2024 01:50 PM EST RP Workstation: [id number]       Assessment / Plan:    #51 58 year old African-American female with complicated recent history, multiple serious comorbidities who we were asked to evaluate for anemia acute on chronic with drift in hemoglobin and a heme positive stool.  CT November 2026 during earlier admission done for complaints of dysphagia had normal-appearing esophagus empirically dilated due to complaint of dysphagia and very mild gastritis Negative colonoscopy 2020 with exception of a few diverticuli  Yesterday patient had reported issues with ongoing loose liquid stools 2-3 times daily postprandially and had just completed a course of vancomycin  for possible C. difficile colitis.  She  had a positive C. difficile antigen but toxin negative  Ordered repeat C. difficile quick screen however she has not had any stools yesterday or today which also makes persistent/refractory C. difficile very unlikely Will stop C. difficile precautions  Her anemia is multifactorial in setting of chronic kidney disease requiring peritoneal dialysis,  she is not having any evidence of active GI bleeding  2 HIV 3.  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  Prior aortic dissection status post endovascular repair 7 sarcoidosis 8.  Peripheral vascular disease 9.  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 difficile precautions We do not plan to pursue any endoscopic evaluation at this time, she is not having any evidence of active GI bleeding hemoglobin now stable x 2 days. She has had multiple other serious comorbidities over the past month. GI will be available     Principal Problem:   Debility Active Problems:   Heme positive stool   Cognitive and neurobehavioral dysfunction     LOS: 5 days   Emillie Chasen EsterwoodPA-C  10/02/2024, 10:55 AM

## 2024-10-02 NOTE — Progress Notes (Signed)
 [facility] Progress Note   Subjective:    Seen and examined patient at bedside. She reports not getting enough sleep overnight. She reports tolerating PD overnight. Denies SOB, ABD pain, and N/V. Noted her central acces was removed. Plan for PD tonight.  Objective Vitals:   10/02/24 0459 10/02/24 0500 10/02/24 0851 10/02/24 0916  BP: (!) 104/52  (!) 109/57   Pulse: 78  77 92  Resp: 18   16  Temp: 98 F (36.7 C)     TempSrc: Oral     SpO2: 100%   99%  Weight:  78.7 kg    Height:       Physical Exam General: Alert female in NAD Heart: RRR, no murmurs Lungs: CTA bilaterally, respirations unlabored Abdomen: Soft, non-distended Extremities: No edema b/l lower extremities Dialysis Access:  PD cath   [facility] Weights   09/30/24 1400 10/01/24 1300 10/02/24 0500  Weight: 79.2 kg (P) 77.1 kg 78.7 kg    Intake/Output Summary (Last 24 hours) at 10/02/2024 1317 Last data filed at 10/02/2024 0714 Gross per 24 hour  Intake 356 ml  Output 564 ml  Net -208 ml    Additional Objective Labs: Basic Metabolic Panel: Recent Labs  Lab 09/28/24 0500 09/29/24 0434 09/30/24 0402 10/01/24 0428 10/02/24 0949  NA 135  --  133* 137  --   K 3.9  --  3.9 3.2*  --   CL 94*  --  94* 94*  --   CO2 23  --  22 24  --   GLUCOSE 110*  --  83 93  --   BUN 43*  --  57* 58*  --   CREATININE 11.90*  --  12.56* 12.31*  --   CALCIUM  8.5*  --  7.8* 8.5*  --   PHOS 7.1*   < > 8.4* 7.5* 6.8*   < > = values in this interval not displayed.   Liver Function Tests: Recent Labs  Lab 09/28/24 0500 10/01/24 0428  AST 34 28  ALT 20 27  ALKPHOS 374* 317*  BILITOT 1.0 1.3*  PROT 6.0* 6.1*  ALBUMIN  2.7* 2.8*   No results for input(s): LIPASE, AMYLASE in the last 168 hours. CBC: Recent Labs  Lab 09/27/24 0356 09/28/24 0500 09/30/24 0402 10/01/24 0428 10/02/24 0949  WBC 12.9* 13.2* 15.3* 15.8* 17.6*  NEUTROABS  --  11.9*  --  13.3*  --   HGB 9.4* 8.6* 7.6* 8.1* 8.2*  HCT  27.8* 25.5* 22.1* 23.8* 24.1*  MCV 108.2* 106.3* 105.7* 108.2* 107.6*  PLT 247 233 230 244 212   Blood Culture    Component Value Date/Time   SDES BLOOD RIGHT HAND 09/30/2024 2103   SDES BLOOD RIGHT HAND 09/30/2024 2103   SPECREQUEST  09/30/2024 2103    BOTTLES DRAWN AEROBIC ONLY Blood Culture results may not be optimal due to an inadequate volume of blood received in culture bottles   SPECREQUEST  09/30/2024 2103    BOTTLES DRAWN AEROBIC ONLY Blood Culture results may not be optimal due to an inadequate volume of blood received in culture bottles   CULT  09/30/2024 2103    NO GROWTH 2 DAYS Performed at [facility], [address] [place], [place] [address]    CULT  09/30/2024 2103    NO GROWTH 2 DAYS Performed at [facility], [address], [place], [place] [address]    REPTSTATUS PENDING 09/30/2024 2103   REPTSTATUS PENDING 09/30/2024 2103  Cardiac Enzymes: No results for input(s): CKTOTAL, CKMB, CKMBINDEX, TROPONINI in the last 168 hours. CBG: Recent Labs  Lab 10/01/24 1139 10/01/24 1736 10/01/24 2058 10/02/24 0500 10/02/24 1108  GLUCAP 116* 124* 194* 118* 124*   Iron  Studies: No results for input(s): IRON , TIBC, TRANSFERRIN, FERRITIN in the last 72 hours. Lab Results  Component Value Date   INR 1.1 09/23/2024   INR 1.4 (H) 08/29/2023   INR 1.4 (H) 08/28/2023   Studies/Results: DG Chest 2 View Result Date: 09/30/2024 EXAM: 2 VIEW(S) XRAY OF THE CHEST 09/30/2024 01:30:00 PM COMPARISON: Comparison with 09/23/2024. CLINICAL HISTORY: Leukocytosis. FINDINGS: LINES, TUBES AND DEVICES: Right central venous catheter has been placed with tip projecting over the mid SVC region. LUNGS AND PLEURA: Shallow inspiration. No focal pulmonary opacity. No pleural effusion. No pneumothorax. HEART AND MEDIASTINUM: Postoperative changes with sternotomy wires and aortic stent graft. Heart size and pulmonary vascularity are normal. BONES AND SOFT TISSUES:  Surgical clips in the base of the neck. No acute osseous abnormality. IMPRESSION: 1. Right central venous catheter with tip over the mid SVC, without pneumothorax. Electronically signed by: Elsie Gravely MD 09/30/2024 01:50 PM EST RP Workstation: [id number]    Medications:  dialysis solution 1.5% low-MG/low-CA      allopurinol   100 mg Oral Daily   aspirin   81 mg Oral Daily   budesonide  (PULMICORT ) nebulizer solution  0.25 mg Nebulization BID   calcitRIOL   0.25 mcg Oral Once per day on Monday Wednesday Friday   darbepoetin (ARANESP ) injection - DIALYSIS  100 mcg Subcutaneous Q Mon-1800   dolutegravir   50 mg Oral Daily   emtricitabine -tenofovir  AF  1 tablet Oral Daily   feeding supplement  237 mL Oral BID BM   fludrocortisone   0.1 mg Oral Daily   gentamicin  cream  1 Application Topical Daily   hydrocortisone   25 mg Oral BID   Followed by   NOREEN ON 10/03/2024] hydrocortisone   15 mg Oral BID   insulin  aspart  0-5 Units Subcutaneous QHS   insulin  aspart  0-6 Units Subcutaneous TID WC   levothyroxine   150 mcg Oral Q0600   lidocaine   1 patch Transdermal Q24H   loratadine   10 mg Oral QHS   melatonin  5 mg Oral QHS   metoprolol  succinate  12.5 mg Oral Daily   mirtazapine   7.5 mg Oral QHS   multivitamin  1 tablet Oral QHS   nystatin   5 mL Oral QID   pantoprazole   40 mg Oral Daily   rosuvastatin   10 mg Oral Daily   saccharomyces boulardii  250 mg Oral Daily   sevelamer  carbonate  800 mg Oral TID WC    Dialysis Orders: CCPD 7x week-[facility] 5 exchanges, fill vol 2250, 1.5hr dwell time, edw 74.5kg  Usually does 1/2 1.5% bags and 1/2 2.5% bags   CXR 12/08: no active disease  Assessment/Plan: Leukocytosis: Discussed with the primary team: WBCs are going up. CVL was removed 12/16. CT ABD/pelvis showed no acute findings and blood cultures 12/15 NGTD.   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 BP's better. Now in CIR.  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 Cardiology consulting, w/ sig WMA's they are planning RHC/ LHC. Appreciate assitance.  Euvolemic on exam today, but she reported feeling full today. Managing volume with dialysis.  ESRD: on CCPD. Plan for PD tonight. Will use 1/2 1.5% bags and 1/2 2.5% bags per her usual regimen. Volume: no  signs of vol overload, admit CXR was negative.  Anemia of esrd: Hgbs usually 10-11s in outpatient but now dropping-Hgb now 8.2. Now on ESA. Noted hemoccult is positive. D/w the primary team who has re-consulted GI. Transfuse for Hgb < 7. Tremors: discontinued gabapentin  and this is a cause of tremors/ asterixis in ESRD pts. Improved Secondary hyperparathyroidism: Calcium  contorlled, phos elevated. Continue calcitriol . Raised Renvela  to 2 tablets with meals.   Charmaine Piety, NP [facility] 10/02/2024,1:17 PM  LOS: 5 days

## 2024-10-02 NOTE — Progress Notes (Signed)
 Occupational Therapy Session Note  Patient Details  Name: Shawnette Augello MRN: [id number] Date of Birth: 05-21-1966   Session 1: Today's Date: 10/02/2024 OT Individual Time: 8898-8841 OT Individual Time Calculation (min): 57 min  Session 2:  Today's Date: 10/02/2024 OT Individual Time: 8654-8567 OT Individual Time Calculation (min): 47 min   Short Term Goals: Week 1:  OT Short Term Goal 1 (Week 1): The pt will safely complete LB bathing and dressing with Min/ ModI using AE as needed. OT Short Term Goal 2 (Week 1): The pt will safely complete toileting with Min/ ModI using AE as needed. OT Short Term Goal 3 (Week 1): The pt will complete safely complete tub/shower transfers using AE with Min/ ModI after demonstration and initial cues. OT Short Term Goal 4 (Week 1): The pt will safely complete  OT therapy 85% of the time with not more than 3 rest breaks. OT Short Term Goal 5 (Week 1): The pt will safely ambulate for function using a  RW with closeS and additional time.  Skilled Therapeutic Interventions/Progress Updates:  Session 1: Skilled OT session completed to address ADL retraining and pain mgmt in the home environment. Pt received supine in bed, agreeable to participate in therapy. Pt reports 5/10 pain in low back radiating towards LLE, OT provided pain intervention by instructing pt on sciatic nerve gliding exs, following exs pt reports decrease in pain.   Supine>EOB CGA, pt uses bed rails to reach EOB. Pt completed self-managing of PD tubing with set-up for supply retrieval prior to ADLs to prevent pulling. EOB>toilet with RW CGA to attempt voiding trial. Pt manages toileting hygiene with Min A to reach standing. Pt completes functional mobility to sink CGA with RW. Pt completes sink level ADLs with CGA, standing intermittently during task when performing peri care. Pt donned/doffed hospital gown with set-up A to tie/untie. Pt completed functional mobility with RW to EOB. RN  consulted prior to managing bandages following ADLs, OT removed and reapplied sacral covering and bandage at L side of abdomen.   Pt instructed on bilateral supine sciatic nerve gliding with verbal cues to reduce pain and promote carryover for in home pain mgmt.   Pt in supine with bed alarm on and all needs within reach.   Session 2: Skilled OT session completed to address IADL retraining and functional mobility. Pt received seated EOB with sister and dog present, agreeable to participate in therapy. Pt reports no pain.  EOB>WC with No AD CGA. Pt dependently propelled to ADL apt. Pt, OT, and sister collaborate on increasing activity tolerance to improve quality of life within the home environment and increase independence with ADLs/IADLs. Pt endorses fatigue and pain during daily activity at home and remains in bed throughout the day. OT provided education on increasing activity to reduce pain and importance of movement to remain active. Pt verbalized understanding. Pt completed item retrieval in kitchen with RW with supervision to increase RW safety and activity tolerance. Pt required VC during task to keep 1 UE on RW at all times for support. OT provided education on RW bag to A with managing items in the home. Pt tolerated 5 mins of standing and reporting no pain; however, increased fatigue. Pt endorsed walking dog requires 10 mins of standing.   Pt dependently propelled to dayroom. Pt completed functional mobility ~73ft+50ft in two trials with RW in support of walking dog in home environment. Trial 1: with RW CGA. Trial 2: Pt completed with dog, supervision for leash  and RW mgmt to reduce risk for falls. Pt sustained standing for 10 minutes, no pain noted. Pt displayed carryover from previous sessions with breathing techniques during activity. Pt returned to [facility], dependently propelled back to room with all needs in reach.  Therapy Documentation Precautions:  Precautions Precautions: Fall Recall  of Precautions/Restrictions: Intact Precaution/Restrictions Comments: PD catheter at mid lower abdomen, triple lumen central line in R jugular, on ART for HIV(+) Restrictions Weight Bearing Restrictions Per Provider Order: No    Therapy/Group: Individual Therapy  Mark Benecke Woods-Chance, MS, OTR/L 10/02/2024, 7:57 AM

## 2024-10-02 NOTE — Progress Notes (Signed)
 PROGRESS NOTE   Subjective/Complaints:    Pt reports didn't sleep well at all- no sleep- didn't take Trazodone  since made her too sleepy at 50 mg- but didn't try 25 mg- interested in melatonin.   Didn't eat much yesterday or this AM- took supplement.  Nibbled on breakfast this AM.   ID or infection prevention felt since pt was (+) for Cdiff in 11/25 (per nurse) that she might still have C diff- in last few days had 2 formed and 2 mushy stools- LBM 2 days ago-  C diff is pending her having BM.   They pulled her CVL yesterday and got peripheral IV.   ROS:    Pt denies SOB, abd pain, CP,   less nausea (+)N/V/C/D, and vision changes   Per HPI +Back pain controlled usually   Objective:   DG Chest 2 View Result Date: 09/30/2024 EXAM: 2 VIEW(S) XRAY OF THE CHEST 09/30/2024 01:30:00 PM COMPARISON: Comparison with 09/23/2024. CLINICAL HISTORY: Leukocytosis. FINDINGS: LINES, TUBES AND DEVICES: Right central venous catheter has been placed with tip projecting over the mid SVC region. LUNGS AND PLEURA: Shallow inspiration. No focal pulmonary opacity. No pleural effusion. No pneumothorax. HEART AND MEDIASTINUM: Postoperative changes with sternotomy wires and aortic stent graft. Heart size and pulmonary vascularity are normal. BONES AND SOFT TISSUES: Surgical clips in the base of the neck. No acute osseous abnormality. IMPRESSION: 1. Right central venous catheter with tip over the mid SVC, without pneumothorax. Electronically signed by: Elsie Gravely MD 09/30/2024 01:50 PM EST RP Workstation: [id number]   CT ABDOMEN PELVIS WO CONTRAST Result Date: 09/30/2024 CLINICAL DATA:  Anemia, history of aortic dissection. End-stage renal disease. EXAM: CT ABDOMEN AND PELVIS WITHOUT CONTRAST TECHNIQUE: Multidetector CT imaging of the abdomen and pelvis was performed following the standard protocol without IV contrast. RADIATION DOSE REDUCTION: This  exam was performed according to the departmental dose-optimization program which includes automated exposure control, adjustment of the mA and/or kV according to patient size and/or use of iterative reconstruction technique. COMPARISON:  CT abdomen pelvis 09/07/2024. FINDINGS: Lower chest: Bibasilar scarring and volume loss. Partially imaged endovascular stent graft in the lower descending thoracic aorta, extending to the diaphragmatic hiatus. Descending thoracic aorta measures up to approximately 5.1 cm. Heart is enlarged. Age advanced right coronary artery calcification. No pericardial or pleural effusion. Distal esophagus is grossly unremarkable. Hepatobiliary: Liver margin is slightly irregular and the liver is enlarged, 18.2 cm. Noncalcified gallstones. Tiny perihepatic ascites. No biliary ductal dilatation. Pancreas: Negative. Spleen: Negative. Adrenals/Urinary Tract: Right adrenal gland is unremarkable. Left adrenal adenoma. No specific follow-up necessary. Kidneys are mildly atrophic. Excreted contrast in the right renal collecting system related to CT angiography 09/23/2024. Bilateral renal stones and/or renal vascular calcifications. Ureters are decompressed. Contrast is seen in a low volume bladder. Stomach/Bowel: Stomach, small bowel, appendix and colon are unremarkable. Vascular/Lymphatic: Atherosclerotic calcification of the aorta. No pathologically enlarged lymph nodes. Calcified dissection flap in the aorta. Further assessment is limited without IV contrast. Reproductive: No adnexal mass. Other: Small pelvic free fluid. Percutaneous dialysis catheter terminates in the anatomic pelvis. Small umbilical hernia contains fat. Small bilateral inguinal hernias contain fat. Musculoskeletal: Degenerative changes in the spine.  IMPRESSION: 1. No acute findings. 2. Age advanced right coronary artery calcification. 3. Cirrhosis. 4. Cholelithiasis. 5. Minimal ascites with peritoneal dialysis catheter in place. 6.  Left adrenal adenoma. 7. Bilateral renal stones and/or renal vascular calcifications. 8. Partially visualized endovascular stent graft in an aneurysmal descending thoracic aorta, with a chronic dissection flap in the abdominal aorta. 9.  Aortic atherosclerosis (ICD10-I70.0). Electronically Signed   By: Newell Eke M.D.   On: 09/30/2024 10:44   Recent Labs    09/30/24 0402 10/01/24 0428  WBC 15.3* 15.8*  HGB 7.6* 8.1*  HCT 22.1* 23.8*  PLT 230 244   Recent Labs    09/30/24 0402 10/01/24 0428  NA 133* 137  K 3.9 3.2*  CL 94* 94*  CO2 22 24  GLUCOSE 83 93  BUN 57* 58*  CREATININE 12.56* 12.31*  CALCIUM  7.8* 8.5*    Intake/Output Summary (Last 24 hours) at 10/02/2024 0821 Last data filed at 10/02/2024 0714 Gross per 24 hour  Intake 592 ml  Output 564 ml  Net 28 ml        Physical Exam: Vital Signs Blood pressure (!) 104/52, pulse 78, temperature 98 F (36.7 C), temperature source Oral, resp. rate 18, height 5' 5 (1.651 m), weight 78.7 kg, last menstrual period 03/31/2014, SpO2 100%.      General: awake, alert, appropriate,  supine in bed; getting off PD tech in room; NAD HENT: conjugate gaze; oropharynx dry CV: regular rate and rhythm; no JVD Pulmonary: normal effort and no audible wheezes GI: soft, less TTP- mainly form heparin - PD catheter In place Psychiatric: appropriate Neurological: Ox3   Skin: PD site clean, dressed. A few scattered bruises R neck CVL out-  Neuro:   Alert and awake. Normal insight and awareness. Normal language and speech. Cranial nerve exam unremarkable. Moving all 4 extremities to gravity and resistance. Stocking glove sensory loss below ankles and to a lesser extent in both hands.  No abnl resting tone.    Assessment/Plan: 1. Functional deficits which require 3+ hours per day of interdisciplinary therapy in a comprehensive inpatient rehab setting. Physiatrist is providing close team supervision and 24 hour management of active  medical problems listed below. Physiatrist and rehab team continue to assess barriers to discharge/monitor patient progress toward functional and medical goals  Care Tool:  Bathing    Body parts bathed by patient: Right arm, Left arm, Chest, Abdomen, Front perineal area, Buttocks, Right upper leg, Left upper leg, Face, Right lower leg, Left lower leg   Body parts bathed by helper: Left lower leg, Right lower leg     Bathing assist Assist Level: Contact Guard/Touching assist     Upper Body Dressing/Undressing Upper body dressing   What is the patient wearing?: Pull over shirt    Upper body assist Assist Level: Minimal Assistance - Patient > 75%    Lower Body Dressing/Undressing Lower body dressing      What is the patient wearing?: Pants     Lower body assist Assist for lower body dressing: Contact Guard/Touching assist     Toileting Toileting    Toileting assist Assist for toileting: Minimal Assistance - Patient > 75%     Transfers Chair/bed transfer  Transfers assist     Chair/bed transfer assist level: Minimal Assistance - Patient > 75%     Locomotion Ambulation   Ambulation assist      Assist level: Minimal Assistance - Patient > 75% Assistive device: Hand held assist Max distance: 20 ft  Walk 10 feet activity   Assist     Assist level: Contact Guard/Touching assist Assistive device: Walker-rolling   Walk 50 feet activity   Assist Walk 50 feet with 2 turns activity did not occur: Safety/medical concerns         Walk 150 feet activity   Assist Walk 150 feet activity did not occur: Safety/medical concerns         Walk 10 feet on uneven surface  activity   Assist Walk 10 feet on uneven surfaces activity did not occur: Safety/medical concerns         Wheelchair     Assist Is the patient using a wheelchair?: Yes (may not require by d/c for use at home) Type of Wheelchair: Manual    Wheelchair assist level:  Dependent - Patient 0% Max wheelchair distance: 300 ft    Wheelchair 50 feet with 2 turns activity    Assist        Assist Level: Dependent - Patient 0%   Wheelchair 150 feet activity     Assist      Assist Level: Dependent - Patient 0%   Blood pressure (!) 104/52, pulse 78, temperature 98 F (36.7 C), temperature source Oral, resp. rate 18, height 5' 5 (1.651 m), weight 78.7 kg, last menstrual period 03/31/2014, SpO2 100%.   Medical Problem List and Plan: 1. Functional deficits secondary to debility related to circulatory shock, adrenal insufficiency/pituitary apoplexy, new CHF             -patient may  shower             -ELOS/Goals: 7-10 days, mod I goals.              -family is looking at some options for intermittent assistance at home  - Head CT completed last night 12/13 when she bumped her head, no acute abnormalities   D/C 12/23  Con't CIR PT and OT 2.  Antithrombotics: -DVT/anticoagulation:  Mechanical: Sequential compression devices, below knee Bilateral lower extremities Pharmaceutical: Heparin   12/16- Heparin  held due to dropping Hb  12.17- pending labs- to be drawn             -antiplatelet therapy: Aspirin     3. Pain Management:  Tylenol  and Oxycodone  prn.   12/16- denies pain this AM- con't regimen 4. Mood/Behavior/Sleep: LCSW to follow for evaluation and support when available.              -antipsychotic agents: Buspar  10 mg BID prn for anxiety and Remeron  7.5 mg at bedtime.              -pt seems positive. Family very supportive 5. Neuropsych/cognition: This patient is capable of making decisions on her own behalf.   6. Skin/Wound Care: Routine pressure relief measures.   12/17- Pulled CVL since appeared like getting infected 7. Fluids/Electrolytes/Nutrition: Monitor I&O and weight. Follow up labs CBC/CMP               -GERD: Omeprazole  40 mg bid    8. 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    9.ESRD: Continue nightly PD per nephrology.              -pt was independent with management of her PD at home  -12/14, discussed Renvela  with nephrology, they plan to resume  12/16- K+ 3.2- per renal-  10.Hx of papillary thyroid  cancer s/p thyroidectomy: TSH normal, continue Synthroid .    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    12. HFrEF, without acute exacerbation: Nephrology to manage volume status. Cardiology following. Continue heart healthy diet and GDMT as tolerated. Daily weights-appears stable  12/16- Weight up to 79.2- up 2 lbs from day prior- but was done at 1400- no weight today- will ask nursing for one.   12/17- Weight 78.7 kg- is overall stable Filed Weights   09/30/24 1400 10/01/24 1300 10/02/24 0500  Weight: 79.2 kg (P) 77.1 kg 78.7 kg      13. Low back pain/Abdominal pain: present since aortic dissection 11/24. Continue to monitor. MRI T and L spine negative, RUQ US  negative for acute findings.              -continue Lidocaine  patch              -pt is able to work through discomfort  12/16- Been having abd pain/back pain for 2 weeks- CT didn't show any acute pathology- did show Cirrhosis, cholelithiasis and B/L renal stones.  14. HTN: Monitor BP per protocol, continue Metoprolol    12/16- 12/17- BP controlled- con't regimen      10/02/2024    5:00 AM 10/02/2024    4:59 AM 10/01/2024    7:38 PM  Vitals with BMI  Weight 173 lbs 8 oz    BMI 28.87    Systolic  104 128  Diastolic  52 64  Pulse  78 84    15. HLD: Crestor  10 mg    16. HIV: CD4 445 on 10/15. Continue home [name]  and Descovy               12/16- will call ID about rising WBC- esp in light of HIV  12/17- d/w ID- they feel HIV is not contributing to issues 17. T2DM: Monitor glucose ac/hs with SSI.              -associated peripheral neuropathy  12/17- BG's slightly variable, but less than 200- con't regimen      -CBG (last 3)  Recent Labs    10/01/24 1736 10/01/24 2058 10/02/24 0500  GLUCAP 124* 194* 118*   18. Chronic nausea/poor appetite  -12/13 Ate 50%, continue monitor intake  -Continue Remaron  12/17- Still poor appetite- but taking supplements when needed 19. Diarrhea  -12/13 LBM recorded yesterday type 4, medium, continue to monitor   -pt interested in trying probiotics, will start  12/16- Denies diarrhea- LBM yesterday  12/17- Pt has had 2 formed and 2 mushy stools in last 5 days- LBM 2 days ago- on enteric precautions since was found to have C diff (-) in 11/25- pending C diff when pt has BM 20. Leukocytosis  12/15- WBC 15.3 up from 13k -will check if voids at all- and order U/A and Cx if so and check CXR- will also order labs for AM- and  might need to remove CVL in R neck-   12/16- got Blood C'x- so far, nothing back yet- WBC up somewhat to 15.8- from 15.3- will call ID- is anuric, so don't have source there and CXR was (-) for acute issues. - spoke to ID- they don't feel it's worrisome, but keep an eye on Cx's- and manage if clinically gets worse. 12/17- Pulled CVL on R neck- wasn't looking well- Cultures so far 2 days (-)  21. Acute on chronic anemia  12/15- Due to ESRD- but also dropped almost 1 unit since Saturday- which is  1 unit lower than average last 1 week. Will check CT of abd/pelvis and hemoccult- also reached out to Renal about this.   12/16- CT was (-) for acute pathology that would explain- Hemoccult (+)- stopped Heparin  SQ- and GI called- will check into this.  Also Hb up somewhat to 8.1 today  12/17- GI wants C diff checked- with PCR- could be colitis? Don't feel colonoscopy will add much they will consider colonoscopy if Hb continues to drift down with no other source   I spent a total of 50   minutes on total care today- >50% coordination of care- due to  D/w nursing x2- about labs and report- also reviewed micro- labs pending- also reviewed Renal and GI note- and  d/w pt about C diff work up- also review of other notes- and review of labs when they come back  LOS: 5 days A FACE TO FACE EVALUATION WAS PERFORMED  Terrie Haring 10/02/2024, 8:21 AM

## 2024-10-03 DIAGNOSIS — R5381 Other malaise: Secondary | ICD-10-CM | POA: Diagnosis not present

## 2024-10-03 LAB — BASIC METABOLIC PANEL WITH GFR
Anion gap: 18 — ABNORMAL HIGH (ref 5–15)
BUN: 57 mg/dL — ABNORMAL HIGH (ref 6–20)
CO2: 25 mmol/L (ref 22–32)
Calcium: 8.8 mg/dL — ABNORMAL LOW (ref 8.9–10.3)
Chloride: 94 mmol/L — ABNORMAL LOW (ref 98–111)
Creatinine, Ser: 11.7 mg/dL — ABNORMAL HIGH (ref 0.44–1.00)
GFR, Estimated: 3 mL/min — ABNORMAL LOW (ref >60)
Glucose, Bld: 94 mg/dL (ref 70–99)
Potassium: 3.7 mmol/L (ref 3.5–5.1)
Sodium: 136 mmol/L (ref 135–145)

## 2024-10-03 LAB — CBC
HCT: 22.7 % — ABNORMAL LOW (ref 36.0–46.0)
Hemoglobin: 7.8 g/dL — ABNORMAL LOW (ref 12.0–15.0)
MCH: 36.4 pg — ABNORMAL HIGH (ref 26.0–34.0)
MCHC: 34.4 g/dL (ref 30.0–36.0)
MCV: 106.1 fL — ABNORMAL HIGH (ref 80.0–100.0)
Platelets: 225 K/uL (ref 150–400)
RBC: 2.14 MIL/uL — ABNORMAL LOW (ref 3.87–5.11)
RDW: 15.5 % (ref 11.5–15.5)
WBC: 17.5 K/uL — ABNORMAL HIGH (ref 4.0–10.5)
nRBC: 2.9 % — ABNORMAL HIGH (ref 0.0–0.2)

## 2024-10-03 LAB — BODY FLUID CELL COUNT WITH DIFFERENTIAL: Total Nucleated Cell Count, Fluid: 1 uL (ref 0–1000)

## 2024-10-03 LAB — GLUCOSE, CAPILLARY
Glucose-Capillary: 94 mg/dL (ref 70–99)
Glucose-Capillary: 128 mg/dL — ABNORMAL HIGH (ref 70–99)
Glucose-Capillary: 76 mg/dL (ref 70–99)
Glucose-Capillary: 105 mg/dL — ABNORMAL HIGH (ref 70–99)

## 2024-10-03 MED ORDER — GENTAMICIN SULFATE 0.1 % EX CREA
1.0000 | TOPICAL_CREAM | Freq: Every day | CUTANEOUS | Status: DC
  Administered 2024-10-03 – 2024-10-05 (×3): 1 via TOPICAL
  Filled 2024-10-03: qty 15

## 2024-10-03 MED ORDER — BUSPIRONE HCL 10 MG PO TABS
10.0000 mg | ORAL_TABLET | Freq: Two times a day (BID) | ORAL | Status: DC
  Administered 2024-10-03 – 2024-10-08 (×11): 10 mg via ORAL
  Filled 2024-10-03 (×11): qty 1

## 2024-10-03 MED ORDER — FAMOTIDINE 20 MG PO TABS
10.0000 mg | ORAL_TABLET | Freq: Every day | ORAL | Status: DC
  Administered 2024-10-03 – 2024-10-08 (×6): 10 mg via ORAL
  Filled 2024-10-03 (×6): qty 1

## 2024-10-03 NOTE — Plan of Care (Signed)
  Problem: Consults Goal: RH GENERAL PATIENT EDUCATION Description: See Patient Education module for education specifics. Outcome: Progressing   Problem: RH SKIN INTEGRITY Goal: RH STG SKIN FREE OF INFECTION/BREAKDOWN Description: Manage skin free of infection with mod I assistance  Outcome: Progressing

## 2024-10-03 NOTE — Progress Notes (Signed)
 PROGRESS NOTE   Subjective/Complaints:    Pt reports had LBM yest evening.  Just mild nausea- which is somewhat better.   Less abd pain-  Hemoccult (+) x2 now  ROS:     Pt denies SOB, abd pain, CP,  (+) mild- getting better N/V/C/D, and vision changes   Per HPI +Back pain controlled usually   Objective:   No results found.  Recent Labs    10/02/24 0949 10/03/24 0507  WBC 17.6* 17.5*  HGB 8.2* 7.8*  HCT 24.1* 22.7*  PLT 212 225   Recent Labs    10/01/24 0428 10/03/24 0507  NA 137 136  K 3.2* 3.7  CL 94* 94*  CO2 24 25  GLUCOSE 93 94  BUN 58* 57*  CREATININE 12.31* 11.70*  CALCIUM  8.5* 8.8*    Intake/Output Summary (Last 24 hours) at 10/03/2024 0949 Last data filed at 10/03/2024 9365 Gross per 24 hour  Intake 240 ml  Output 112 ml  Net 128 ml        Physical Exam: Vital Signs Blood pressure 115/63, pulse 82, temperature 98.1 F (36.7 C), temperature source Oral, resp. rate 16, height 5' 5 (1.651 m), weight 77.7 kg, last menstrual period 03/31/2014, SpO2 100%.       General: awake, alert, appropriate, supine in bed; off PD; NAD HENT: conjugate gaze; oropharynx dry CV: regular rate and rhythm; no JVD Pulmonary: CTA B/L; no W/R/R- good air movement GI: soft,  NT today - getting better- PD catheter in place- normoactive BS Psychiatric: appropriate- interactive Neurological: Ox3   Skin: PD site clean, dressed. A few scattered bruises R neck CVL out-  Neuro:   Alert and awake. Normal insight and awareness. Normal language and speech. Cranial nerve exam unremarkable. Moving all 4 extremities to gravity and resistance. Stocking glove sensory loss below ankles and to a lesser extent in both hands.  No abnl resting tone.    Assessment/Plan: 1. Functional deficits which require 3+ hours per day of interdisciplinary therapy in a comprehensive inpatient rehab setting. Physiatrist is  providing close team supervision and 24 hour management of active medical problems listed below. Physiatrist and rehab team continue to assess barriers to discharge/monitor patient progress toward functional and medical goals  Care Tool:  Bathing    Body parts bathed by patient: Right arm, Left arm, Chest, Abdomen, Front perineal area, Buttocks, Right upper leg, Left upper leg, Face, Right lower leg, Left lower leg   Body parts bathed by helper: Left lower leg, Right lower leg     Bathing assist Assist Level: Contact Guard/Touching assist     Upper Body Dressing/Undressing Upper body dressing   What is the patient wearing?: Pull over shirt    Upper body assist Assist Level: Minimal Assistance - Patient > 75%    Lower Body Dressing/Undressing Lower body dressing      What is the patient wearing?: Pants     Lower body assist Assist for lower body dressing: Contact Guard/Touching assist     Toileting Toileting    Toileting assist Assist for toileting: Minimal Assistance - Patient > 75%     Transfers Chair/bed transfer  Transfers assist  Chair/bed transfer assist level: Minimal Assistance - Patient > 75%     Locomotion Ambulation   Ambulation assist      Assist level: Minimal Assistance - Patient > 75% Assistive device: Hand held assist Max distance: 20 ft   Walk 10 feet activity   Assist     Assist level: Contact Guard/Touching assist Assistive device: Walker-rolling   Walk 50 feet activity   Assist Walk 50 feet with 2 turns activity did not occur: Safety/medical concerns         Walk 150 feet activity   Assist Walk 150 feet activity did not occur: Safety/medical concerns         Walk 10 feet on uneven surface  activity   Assist Walk 10 feet on uneven surfaces activity did not occur: Safety/medical concerns         Wheelchair     Assist Is the patient using a wheelchair?: Yes (may not require by d/c for use at  home) Type of Wheelchair: Manual    Wheelchair assist level: Dependent - Patient 0% Max wheelchair distance: 300 ft    Wheelchair 50 feet with 2 turns activity    Assist        Assist Level: Dependent - Patient 0%   Wheelchair 150 feet activity     Assist      Assist Level: Dependent - Patient 0%   Blood pressure 115/63, pulse 82, temperature 98.1 F (36.7 C), temperature source Oral, resp. rate 16, height 5' 5 (1.651 m), weight 77.7 kg, last menstrual period 03/31/2014, SpO2 100%.   Medical Problem List and Plan: 1. Functional deficits secondary to debility related to circulatory shock, adrenal insufficiency/pituitary apoplexy, new CHF             -patient may  shower             -ELOS/Goals: 7-10 days, mod I goals.              -family is looking at some options for intermittent assistance at home  - Head CT completed last night 12/13 when she bumped her head, no acute abnormalities   D/C 12/23  Con't CIR PT and OT 2.  Antithrombotics: -DVT/anticoagulation:  Mechanical: Sequential compression devices, below knee Bilateral lower extremities Pharmaceutical: Heparin   12/16- Heparin  held due to dropping Hb             -antiplatelet therapy: Aspirin     3. Pain Management:  Tylenol  and Oxycodone  prn.   12/16- denies pain this AM- con't regimen 4. Mood/Behavior/Sleep: LCSW to follow for evaluation and support when available.              -antipsychotic agents: Buspar  10 mg BID prn for anxiety and Remeron  7.5 mg at bedtime.              -pt seems positive. Family very supportive  12/18- poor sleep- added Melatonin and has Trazodone  25 mg prn if need be 5. Neuropsych/cognition: This patient is capable of making decisions on her own behalf.   6. Skin/Wound Care: Routine pressure relief measures.   12/17- Pulled CVL since appeared like getting infected 7. Fluids/Electrolytes/Nutrition: Monitor I&O and weight. Follow up labs CBC/CMP               -GERD: Omeprazole  40  mg bid    8. 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    12/18- changed to PO to get rid of CVL- also  could be cause of leukocytosis? 9.ESRD: Continue nightly PD per nephrology.              -pt was independent with management of her PD at home  -12/14, discussed Renvela  with nephrology, they plan to resume  12/16- K+ 3.2- per renal-   12/18- K+ 3.7 10.Hx of papillary thyroid  cancer s/p thyroidectomy: TSH normal, continue Synthroid .    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    12. HFrEF, without acute exacerbation: Nephrology to manage volume status. Cardiology following. Continue heart healthy diet and GDMT as tolerated. Daily weights-appears stable  12/16- Weight up to 79.2- up 2 lbs from day prior- but was done at 1400- no weight today- will ask nursing for one.   12/17- Weight 78.7 kg- is overall stable  12/18- Weight 77.7 kg- doing well Filed Weights   10/01/24 1300 10/02/24 0500 10/03/24 0447  Weight: (P) 77.1 kg 78.7 kg 77.7 kg      13. Low back pain/Abdominal pain: present since aortic dissection 11/24. Continue to monitor. MRI T and L spine negative, RUQ US  negative for acute findings.              -continue Lidocaine  patch              -pt is able to work through discomfort  12/16- Been having abd pain/back pain for 2 weeks- CT didn't show any acute pathology- did show Cirrhosis, cholelithiasis and B/L renal stones.  14. HTN: Monitor BP per protocol, continue Metoprolol    12/16- 12/18- BP controlled- looks good- con't regimen      10/03/2024    4:47 AM 10/02/2024    7:50 PM 10/02/2024    9:16 AM  Vitals with BMI  Weight 171 lbs 5 oz    BMI 28.51    Systolic 115 95   Diastolic 63 64   Pulse 82 82 92    15. HLD: Crestor  10 mg    16. HIV: CD4 445 on 10/15. Continue home [name]  and Descovy               12/16- will call ID about rising WBC-  esp in light of HIV  12/17- d/w ID- they feel HIV is not contributing to issues 17. T2DM: Monitor glucose ac/hs with SSI.              -associated peripheral neuropathy  12/17-12/18 BG's slightly variable, but less than 200- con't regimen     -CBG (last 3)  Recent Labs    10/02/24 1951 10/02/24 2201 10/03/24 0633  GLUCAP 192* 119* 94   18. Chronic nausea/poor appetite  -12/13 Ate 50%, continue monitor intake  -Continue Remaron  12/17- Still poor appetite- but taking supplements when needed 19. Diarrhea  -12/13 LBM recorded yesterday type 4, medium, continue to monitor   -pt interested in trying probiotics, will start  12/16- Denies diarrhea- LBM yesterday  12/17- Pt has had 2 formed and 2 mushy stools in last 5 days- LBM 2 days ago- on enteric precautions since was found to have C diff (-) in 11/25- pending C diff when pt has BM  12/18- off C diff enteric precautions- No Sx's of C diff- also LBM formed yesterday evening 20. Leukocytosis  12/15- WBC 15.3 up from 13k -will check if voids at all- and order U/A and Cx if so and check CXR- will also order labs for AM- and  might need to remove  CVL in R neck-   12/16- got Blood C'x- so far, nothing back yet- WBC up somewhat to 15.8- from 15.3- will call ID- is anuric, so don't have source there and CXR was (-) for acute issues. - spoke to ID- they don't feel it's worrisome, but keep an eye on Cx's- and manage if clinically gets worse. 12/17- Pulled CVL on R neck- wasn't looking well- Cultures so far 2 days (-)  12/18- Cultures day 3 (-) and peritoneal fluid has no WBCs or organisms so far- done 3am today 21. Acute on chronic anemia  12/15- Due to ESRD- but also dropped almost 1 unit since Saturday- which is 1 unit lower than average last 1 week. Will check CT of abd/pelvis and hemoccult- also reached out to Renal about this.   12/16- CT was (-) for acute pathology that would explain- Hemoccult (+)- stopped Heparin  SQ- and GI called- will  check into this.  Also Hb up somewhat to 8.1 today  12/17- GI wants C diff checked- with PCR- could be colitis? Don't feel colonoscopy will add much they will consider colonoscopy if Hb continues to drift down with no other source  12/18- Hb 7.8 today- has dropped a little- hemoccult (+) x2- but also has hemorrhoids it appears-will con't to check daily and make sure doesn't drop more.     I spent a total of  54   minutes on total care today- >50% coordination of care- due to  D/w pt at length as well as night and day nurse- also d/w NP and reviewed labs, vitals and B/B  LOS: 6 days A FACE TO FACE EVALUATION WAS PERFORMED  Aul Mangieri 10/03/2024, 9:49 AM

## 2024-10-03 NOTE — Progress Notes (Signed)
 Physical Therapy Session Note  Patient Details  Name: [name] MRN: [id number] Date of Birth: 08/28/1966  Today's Date: 10/03/2024 PT Individual Time: 1001-1044, 8597-8491 PT Individual Time Calculation (min): 43 min, 66 min   Short Term Goals: Week 1:  PT Short Term Goal 1 (Week 1): Pt will perform all standing transfers with SBA. PT Short Term Goal 2 (Week 1): Pt will ambulate at least 60 ft per bout using LRAD with overall SBA demonstrating improved activity tolerance. PT Short Term Goal 3 (Week 1): Pt will initiate stair training. PT Short Term Goal 4 (Week 1): Pt will perform appropriate outcome measure.  Skilled Therapeutic Interventions/Progress Updates:      Treatment Session 1   Pt seated EOB upon arrival. Pt agreeable to therapy. Pt endorses nausea and heartburn/acid reflux this morning, requesting medication. Notified nurse.   Pt agreeable to get dressed today with encouragement from PT. Pt donned shirt and pants while seated EOB with set up assist for shirt and pants with SBA while standing with no AD. Pt initiating seated rest break 2/2 fatigue.    6 Min Walk Test:  Instructed patient to ambulate as quickly and as safely as possible for 6 minutes using LRAD. Patient was allowed to take standing rest breaks without stopping the test, but if the patient required a sitting rest break the clock would be stopped and the test would be over.  Results: 136 feet (41.45 meters) using a Rw with supervision. Results indicate that the patient has reduced endurance with ambulation compared to age matched norms. Pt ambulation duration limited to 3 min and 46 seconds 2/2 fatigue. Pt required 3 standing rest breaks prior to requiring seated rest break at 136 feet.   Age Matched Norms: 75-69 yo M: 64 F: 51, 51-79 yo M: 60 F: 471, 13-89 yo M: 417 F: 392 MDC: 58.21 meters (190.98 feet) or 50 meters (ANPTA Core Set of Outcome Measures for Adults with Neurologic Conditions,  2018)  Five times Sit to Stand Test (FTSS) Method: Use a straight back chair with a solid seat that is 16-18 high. Ask participant to sit on the chair with arms folded across their chest.   Instructions: Stand up and sit down as quickly as possible 5 times, keeping your arms folded across your chest.   Measurement: Stop timing when the participant stands the 5th time.  TIME: __61____ (in seconds) - pt requiring use of B UE 2/2 weakness.   Times > 13.6 seconds is associated with increased disability and morbidity (Guralnik, 2000) Times > 15 seconds is predictive of recurrent falls in healthy individuals aged 73 and older (Buatois, et al., 2008) Normal performance values in community dwelling individuals aged 51 and older (Bohannon, 2006): 60-69 years: 11.4 seconds 70-79 years: 12.6 seconds 80-89 years: 14.8 seconds  MCID: >= 2.3 seconds for Vestibular Disorders (Meretta, 2006)  Pt initiated discussion regarding depression. Pt requesting buspar  medication to be scheduled veruss PRN 2/2 pt forgetting to ask for it. Notified MD. Pt expressed reason for depression with emphasis on feeling as though everything is out of her control. PT utilized therapeutic use of self to offer support, encouragement.  Pt seated in WC at end of session with all needs within reach and chair alarm on.   Treatment Session 2   Pt seated in WC upon arrival. Pt agreeable to therapy. Pt denies any pain and/or dizziness.   Pt participated in therapeutic one on one w/c level dance activity with PT with  peer support from other patients in the gym. Focused on UE/LE/core  strengthening/activity tolerance/coordination/endurance and social participation/mood/quality of life and general well-being. Pt very appreciative of this activity.   Pt seated in WC at end of session with all needs within reach and chair alarm on.         Therapy Documentation Precautions:  Precautions Precautions: Fall Recall of  Precautions/Restrictions: Intact Precaution/Restrictions Comments: PD catheter at mid lower abdomen, triple lumen central line in R jugular, on ART for HIV(+) Restrictions Weight Bearing Restrictions Per Provider Order: No  Therapy/Group: Individual Therapy  [facility] [place], [place], DPT  10/03/2024, 7:52 AM

## 2024-10-03 NOTE — Progress Notes (Signed)
 Occupational Therapy Session Note  Patient Details  Name: [name] MRN: [id number] Date of Birth: Apr 11, 1966  Session 1 Today's Date: 10/03/2024 OT Individual Time: 1100-1157 OT Individual Time Calculation (min): 57 min  Session 2 Today's Date: 10/03/2024 OT Individual Time: 8699-8665 OT Individual Time Calculation (min): 34 min   Short Term Goals: Week 1:  OT Short Term Goal 1 (Week 1): The pt will safely complete LB bathing and dressing with Min/ ModI using AE as needed. OT Short Term Goal 2 (Week 1): The pt will safely complete toileting with Min/ ModI using AE as needed. OT Short Term Goal 3 (Week 1): The pt will complete safely complete tub/shower transfers using AE with Min/ ModI after demonstration and initial cues. OT Short Term Goal 4 (Week 1): The pt will safely complete  OT therapy 85% of the time with not more than 3 rest breaks. OT Short Term Goal 5 (Week 1): The pt will safely ambulate for function using a  RW with closeS and additional time.  Skilled Therapeutic Interventions/Progress Updates:  Session 1: Skilled OT session completed to address home management and activity modification for energy conservation. Pt received seated in WC, agreeable to participate in therapy. Pt reports no pain.  Pt declining self-care needs at this time. Pt dependently propelled to ADL apt, requesting education on benefits and differences of grab bar selection for bathroom reno in home. OT provided education on grab bar positioning and different types of grab bars. Pt verbalized understanding.   Pt dependently propelled to patient laundry room. OT provided demo with reacher to remove clothing from washer and dryer in support of proper body mechanics and to reduce low back pain. Pt completed functional mobility to laundry room with RW with CGA, pt retrieves clothing items from washer and dryer with reacher displaying good body mechanics during task. Pt receptive to task modification  and will complete in home environment. OT provided hand-out of reacher and TTB for home purchase. Pt completes functional mobility with RW back to [facility] with supervision, VC for safety when sitting in chair to reach back prior to sitting.   OT provided RW bag to reduce risk for falls when managing items during functional mobility. Pt completed functional mobility with RW to retrieve 10 items in gym with reacher and place in RW bag. Pt retrieved 10/10 items with supervision. Pt requesting to discontinued task d/t fatigue, OT provided WC for seated rest break. Pt dependently propelled back to room, seated in [facility] with all needs in reach.   Session 2: Skilled OT session completed to address BUE strengthening. Pt received seated in WC, agreeable to participate in therapy. Pt reports no pain.  Pt reporting increased swelling in LUE d/t IV, RN notified. Pt dependently propelled to ortho gym. Pt attempted dynamic standing balance activity; however once in standing pt reporting dizziness and feeling light-headed. OT instructed pt to seated position. BP assessed: 112/68. Symptoms resolve. Pt agreeable to continue session, pt completed the following BUE exs to increase strength in support of ADL completion:  -3x10 shoulder press (2x with 3lb weight, 1x with 1lb weight d/t fatigue)  -3x10 chest press with 1lb weight  -3x10 bicep curls with 3lb weight Pt required increased rest breaks throughout task. Pt displayed carryover from previous OT sessions by controlling breathing during activity. Pt dependently propelled back to room seated in [facility] with all needs in reach.   Therapy Documentation Precautions:  Precautions Precautions: Fall Recall of Precautions/Restrictions: Intact Precaution/Restrictions Comments: PD  catheter at mid lower abdomen, triple lumen central line in R jugular, on ART for HIV(+) Restrictions Weight Bearing Restrictions Per Provider Order: No   Therapy/Group: Individual Therapy  Rayan Ines  Woods-Chance, MS, OTR/L 10/03/2024, 7:58 AM

## 2024-10-03 NOTE — Progress Notes (Signed)
 Patient ID: [name], female   DOB: 10-12-1966, 58 y.o.   MRN: [id number]  Pt reports [name] was following prior to admission and have contacted them and they will continue to follow at discharge. Pt reports feeling much better the past two days. Her dog visited yesterday which helped her mood.

## 2024-10-03 NOTE — Progress Notes (Signed)
 [facility] Progress Note   Subjective:    Seen and examined patient while working with PT. She reports L hand pain where here PIV is located. She tolerated PD overnight. Cell count and fluid culture obtained this morning. Plan for PD tonight.  Objective Vitals:   10/02/24 1950 10/02/24 2107 10/03/24 0447 10/03/24 1524  BP: 95/64  115/63 124/70  Pulse: 82  82 85  Resp: 16  16 16   Temp: 98 F (36.7 C)  98.1 F (36.7 C) 98.1 F (36.7 C)  TempSrc: Oral  Oral Oral  SpO2: 100% 98% 100% 99%  Weight:   77.7 kg   Height:       Physical Exam General: Alert female in NAD Heart: RRR, no murmurs Lungs: CTA bilaterally, respirations unlabored Abdomen: Soft, non-distended, non-tender Extremities: No edema b/l lower extremities Dialysis Access:  PD cath   [facility] Weights   10/01/24 1300 10/02/24 0500 10/03/24 0447  Weight: (P) 77.1 kg 78.7 kg 77.7 kg    Intake/Output Summary (Last 24 hours) at 10/03/2024 1528 Last data filed at 10/03/2024 0634 Gross per 24 hour  Intake 240 ml  Output 112 ml  Net 128 ml    Additional Objective Labs: Basic Metabolic Panel: Recent Labs  Lab 09/30/24 0402 10/01/24 0428 10/02/24 0949 10/03/24 0507  NA 133* 137  --  136  K 3.9 3.2*  --  3.7  CL 94* 94*  --  94*  CO2 22 24  --  25  GLUCOSE 83 93  --  94  BUN 57* 58*  --  57*  CREATININE 12.56* 12.31*  --  11.70*  CALCIUM  7.8* 8.5*  --  8.8*  PHOS 8.4* 7.5* 6.8*  --    Liver Function Tests: Recent Labs  Lab 09/28/24 0500 10/01/24 0428  AST 34 28  ALT 20 27  ALKPHOS 374* 317*  BILITOT 1.0 1.3*  PROT 6.0* 6.1*  ALBUMIN  2.7* 2.8*   No results for input(s): LIPASE, AMYLASE in the last 168 hours. CBC: Recent Labs  Lab 09/28/24 0500 09/30/24 0402 10/01/24 0428 10/02/24 0949 10/03/24 0507  WBC 13.2* 15.3* 15.8* 17.6* 17.5*  NEUTROABS 11.9*  --  13.3*  --   --   HGB 8.6* 7.6* 8.1* 8.2* 7.8*  HCT 25.5* 22.1* 23.8* 24.1* 22.7*  MCV 106.3* 105.7* 108.2* 107.6*  106.1*  PLT 233 230 244 212 225   Blood Culture    Component Value Date/Time   SDES PERITONEAL 10/03/2024 0352   SPECREQUEST FLUID 10/03/2024 0352   CULT PENDING 10/03/2024 0352   REPTSTATUS PENDING 10/03/2024 0352    Cardiac Enzymes: No results for input(s): CKTOTAL, CKMB, CKMBINDEX, TROPONINI in the last 168 hours. CBG: Recent Labs  Lab 10/02/24 1716 10/02/24 1951 10/02/24 2201 10/03/24 0633 10/03/24 1216  GLUCAP 146* 192* 119* 94 128*   Iron  Studies: No results for input(s): IRON , TIBC, TRANSFERRIN, FERRITIN in the last 72 hours. Lab Results  Component Value Date   INR 1.1 09/23/2024   INR 1.4 (H) 08/29/2023   INR 1.4 (H) 08/28/2023   Studies/Results: No results found.  Medications:  dialysis solution 1.5% low-MG/low-CA      allopurinol   100 mg Oral Daily   aspirin   81 mg Oral Daily   budesonide  (PULMICORT ) nebulizer solution  0.25 mg Nebulization BID   busPIRone   10 mg Oral BID   calcitRIOL   0.25 mcg Oral Once per day on Monday Wednesday Friday   darbepoetin (ARANESP ) injection - DIALYSIS  100 mcg Subcutaneous  Q Mon-1800   dolutegravir   50 mg Oral Daily   emtricitabine -tenofovir  AF  1 tablet Oral Daily   famotidine   10 mg Oral Daily   feeding supplement  237 mL Oral BID BM   fludrocortisone   0.1 mg Oral Daily   gentamicin  cream  1 Application Topical Daily   hydrocortisone   15 mg Oral BID   insulin  aspart  0-5 Units Subcutaneous QHS   insulin  aspart  0-6 Units Subcutaneous TID WC   levothyroxine   150 mcg Oral Q0600   lidocaine   1 patch Transdermal Q24H   loratadine   10 mg Oral QHS   melatonin  5 mg Oral QHS   metoprolol  succinate  12.5 mg Oral Daily   mirtazapine   7.5 mg Oral QHS   multivitamin  1 tablet Oral QHS   nystatin   5 mL Oral QID   pantoprazole   40 mg Oral Daily   rosuvastatin   10 mg Oral Daily   saccharomyces boulardii  250 mg Oral Daily   sevelamer  carbonate  1,600 mg Oral TID WC    Dialysis Orders: CCPD 7x  week-[facility] 5 exchanges, fill vol 2250, 1.5hr dwell time, edw 74.5kg  Usually does 1/2 1.5% bags and 1/2 2.5% bags   CXR 12/08: no active disease  Assessment/Plan: Leukocytosis: Discussed with the primary team: WBCs are going up. CVL was removed 12/16. CT ABD/pelvis showed no acute findings and blood cultures 12/15 NGTD. Cell count and fluid cultures obtained this morning.   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 BP's better. In CIR.  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 Cardiology consulting, w/ sig WMA's they are planning RHC/ LHC. Appreciate assitance. Remains euvolemic on exam. ESRD: on CCPD. Plan for PD tonight. Will use 1/2 1.5% bags and 1/2 2.5% bags per her usual regimen. Volume: no signs of vol overload, admit CXR was negative.  Anemia of esrd: Hgbs usually 10-11s in outpatient but now dropping-Hgb now 8.2. Now on ESA. Noted hemoccult is positive X 2. GI consulted: per their note, no plan for colonoscopy as Hgb's have been stable. GI has now signed off. Transfuse for Hgb < 7. Tremors: discontinued gabapentin  and this is a cause of tremors/ asterixis in ESRD pts. Improved Secondary hyperparathyroidism: Calcium  contorlled, phos elevated. Continue calcitriol . Raised Renvela  to 2 tablets with meals.   Charmaine Piety, NP [facility] 10/03/2024,3:28 PM  LOS: 6 days

## 2024-10-04 LAB — RENAL FUNCTION PANEL
Albumin: 3.1 g/dL — ABNORMAL LOW (ref 3.5–5.0)
Anion gap: 19 — ABNORMAL HIGH (ref 5–15)
BUN: 55 mg/dL — ABNORMAL HIGH (ref 6–20)
CO2: 23 mmol/L (ref 22–32)
Calcium: 8.7 mg/dL — ABNORMAL LOW (ref 8.9–10.3)
Chloride: 94 mmol/L — ABNORMAL LOW (ref 98–111)
Creatinine, Ser: 11.7 mg/dL — ABNORMAL HIGH (ref 0.44–1.00)
GFR, Estimated: 3 mL/min — ABNORMAL LOW
Glucose, Bld: 96 mg/dL (ref 70–99)
Phosphorus: 5.9 mg/dL — ABNORMAL HIGH (ref 2.5–4.6)
Potassium: 3.7 mmol/L (ref 3.5–5.1)
Sodium: 136 mmol/L (ref 135–145)

## 2024-10-04 LAB — GLUCOSE, CAPILLARY
Glucose-Capillary: 120 mg/dL — ABNORMAL HIGH (ref 70–99)
Glucose-Capillary: 91 mg/dL (ref 70–99)
Glucose-Capillary: 113 mg/dL — ABNORMAL HIGH (ref 70–99)
Glucose-Capillary: 135 mg/dL — ABNORMAL HIGH (ref 70–99)

## 2024-10-04 LAB — CBC
HCT: 22.5 % — ABNORMAL LOW (ref 36.0–46.0)
Hemoglobin: 7.7 g/dL — ABNORMAL LOW (ref 12.0–15.0)
MCH: 36.5 pg — ABNORMAL HIGH (ref 26.0–34.0)
MCHC: 34.2 g/dL (ref 30.0–36.0)
MCV: 106.6 fL — ABNORMAL HIGH (ref 80.0–100.0)
Platelets: 235 K/uL (ref 150–400)
RBC: 2.11 MIL/uL — ABNORMAL LOW (ref 3.87–5.11)
RDW: 15.8 % — ABNORMAL HIGH (ref 11.5–15.5)
WBC: 14.8 K/uL — ABNORMAL HIGH (ref 4.0–10.5)
nRBC: 3.6 % — ABNORMAL HIGH (ref 0.0–0.2)

## 2024-10-04 NOTE — Plan of Care (Signed)
  Problem: Consults Goal: RH GENERAL PATIENT EDUCATION Description: See Patient Education module for education specifics. Outcome: Progressing   Problem: RH SKIN INTEGRITY Goal: RH STG SKIN FREE OF INFECTION/BREAKDOWN Description: Manage skin free of infection with mod I assistance  Outcome: Progressing   Problem: RH SAFETY Goal: RH STG ADHERE TO SAFETY PRECAUTIONS W/ASSISTANCE/DEVICE Description: STG Adhere to Safety Precautions With mod I Assistance/Device. Outcome: Progressing

## 2024-10-04 NOTE — Progress Notes (Signed)
 Occupational Therapy Session Note  Patient Details  Name: [name] MRN: [id number] Date of Birth: [birth year]  Session 1 Today's Date: 10/04/2024 OT Individual Time: 1050-1101 OT Individual Time Calculation (min): 11 min  Session 2 Today's Date: 10/04/2024 OT Individual Time: 8699-8654 OT Individual Time Calculation (min): 45 min    Short Term Goals: Week 1:  OT Short Term Goal 1 (Week 1): The pt will safely complete LB bathing and dressing with Min/ ModI using AE as needed. OT Short Term Goal 2 (Week 1): The pt will safely complete toileting with Min/ ModI using AE as needed. OT Short Term Goal 3 (Week 1): The pt will complete safely complete tub/shower transfers using AE with Min/ ModI after demonstration and initial cues. OT Short Term Goal 4 (Week 1): The pt will safely complete  OT therapy 85% of the time with not more than 3 rest breaks. OT Short Term Goal 5 (Week 1): The pt will safely ambulate for function using a  RW with closeS and additional time.  Skilled Therapeutic Interventions/Progress Updates:  Session 1: Skilled OT session completed to address ADL retraining. Pt received supine in bed, agreeable to participate in therapy. Pt reports no pain.  Supine>EOB with Min A to support trunk. Pt completed UB dressing and footwear with set-up A to retrieve clothing. Pt completed LB dressing with CGA, standing intermittently during activity with no BUE. Pt seated EOB with all needs in reach direct handoff to PT.  Session 2: Skilled OT session completed to address functional activity tolerance and functional mobility. Pt received seated in WC, agreeable to participate in therapy. Pt reports no pain.  Pt dependently propelled to dayroom. Pt completed dynamic standing balance activity at Wii with emphasis on activity tolerance and lateral weight shifting on R side d/t pt endorsing weakness/LOB on R side. Trial 1: Pt standing at RW with CGA, no LOB noted when completed  weight shifting, forward, and lateral reach pt standing for 12 minutes total. Trial 2: Pt standing at RW CGA, LOB noted on R side, pt recovers well LUE only supported on RW pt standing for 14 minutes total. Pt dependently propelled back to unit. Pt completed functional mobility to room 9ft RW CGA+ 78ft without RW. Pt seated in [facility] with uncle present and all needs within reach.  Therapy Documentation Precautions:  Precautions Precautions: Fall Recall of Precautions/Restrictions: Intact Precaution/Restrictions Comments: PD catheter at mid lower abdomen, triple lumen central line in R jugular, on ART for HIV(+) Restrictions Weight Bearing Restrictions Per Provider Order: No    Therapy/Group: Individual Therapy Carling Liberman Woods-Chance, MS, OTR/L 10/04/2024, 7:52 AM

## 2024-10-04 NOTE — Progress Notes (Signed)
 Occupational Therapy Weekly Progress Note  Patient Details  Name: [name] MRN: [id number] Date of Birth: [birth year]  Beginning of progress report period: {Time; dates multiple:304500300} End of progress report period: {Time; dates multiple:304500300}  {CHL IP REHAB OT TIME CALCULATIONS:304400400}   Patient has met {number 1-5:22450} of {number 1-5:20334} short term goals.  ***  Patient continues to demonstrate the following deficits: {impairments:3041632} and therefore will continue to benefit from skilled OT intervention to enhance overall performance with {ADL/iADL:3041649}.  Patient {LTG progression:3041653}.  {plan of rjmz:6958345}  OT Short Term Goals {OT DUH:6958300}  Skilled Therapeutic Interventions/Progress Updates:      Therapy Documentation Precautions:  Precautions Precautions: Fall Recall of Precautions/Restrictions: Intact Precaution/Restrictions Comments: PD catheter at mid lower abdomen, triple lumen central line in R jugular, on ART for HIV(+) Restrictions Weight Bearing Restrictions Per Provider Order: No General:   Vital Signs: Therapy Vitals Temp: 97.8 F (36.6 C) Temp Source: Oral Pulse Rate: 83 Resp: 15 BP: 117/60 Patient Position (if appropriate): Lying Oxygen  Therapy SpO2: 99 % O2 Device: Room Air Pain:   ADL: ADL Equipment Provided:  (RW, wc) Eating: Set up Where Assessed-Eating: Edge of bed, Bed level Grooming: Setup Where Assessed-Grooming: Sitting at sink Upper Body Bathing: Setup Where Assessed-Upper Body Bathing: Sitting at sink Lower Body Bathing: Setup, Minimal assistance Where Assessed-Lower Body Bathing: Sitting at sink Upper Body Dressing: Setup Where Assessed-Upper Body Dressing: Sitting at sink Lower Body Dressing: Minimal assistance Where Assessed-Lower Body Dressing: Sitting at sink Toileting: Setup, Minimal assistance Where Assessed-Toileting: Teacher, Adult Education: Company Secretary Method: Surveyor, Minerals: Raised toilet seat Tub/Shower Transfer: Minimal assistance Web Designer Method: Engineer, Technical Sales: Information systems manager with back Film/video Editor: Minimal assistance Film/video Editor Method: Warden/ranger: Shower seat with back Clinical Research Associate   Exercises:   Other Treatments:     Therapy/Group: {Therapy/Group:3049007}  [name] 10/04/2024, 7:53 AM

## 2024-10-04 NOTE — Progress Notes (Signed)
 " [facility] Progress Note   Subjective:   Seen in room - PD went fine overnight, net UF . No CP/dyspnea. PD effluent cell count 1 - not peritonitis. Some abd pain with palpation today - no BM in 2 days. Rec move around, can do laxative if needed today.  Objective Vitals:   10/03/24 2003 10/04/24 0546 10/04/24 0548 10/04/24 0826  BP: (!) 104/54 117/60    Pulse: 78 83  83  Resp: 16 15  17   Temp:  97.8 F (36.6 C)    TempSrc:  Oral    SpO2: 97% 99%  98%  Weight:   78.7 kg   Height:       Physical Exam General: Well appearing, NAD. Room air Heart: RRR Lungs: CTAB Abdomen: mild TTP, no guarding Extremities: no LE edema Dialysis Access: PD cath in mid L abd  Additional Objective Labs: Basic Metabolic Panel: Recent Labs  Lab 10/01/24 0428 10/02/24 0949 10/03/24 0507 10/04/24 0606  NA 137  --  136 136  K 3.2*  --  3.7 3.7  CL 94*  --  94* 94*  CO2 24  --  25 23  GLUCOSE 93  --  94 96  BUN 58*  --  57* 55*  CREATININE 12.31*  --  11.70* 11.70*  CALCIUM  8.5*  --  8.8* 8.7*  PHOS 7.5* 6.8*  --  5.9*   Liver Function Tests: Recent Labs  Lab 09/28/24 0500 10/01/24 0428 10/04/24 0606  AST 34 28  --   ALT 20 27  --   ALKPHOS 374* 317*  --   BILITOT 1.0 1.3*  --   PROT 6.0* 6.1*  --   ALBUMIN  2.7* 2.8* 3.1*   CBC: Recent Labs  Lab 09/28/24 0500 09/30/24 0402 10/01/24 0428 10/02/24 0949 10/03/24 0507 10/04/24 0606  WBC 13.2* 15.3* 15.8* 17.6* 17.5* 14.8*  NEUTROABS 11.9*  --  13.3*  --   --   --   HGB 8.6* 7.6* 8.1* 8.2* 7.8* 7.7*  HCT 25.5* 22.1* 23.8* 24.1* 22.7* 22.5*  MCV 106.3* 105.7* 108.2* 107.6* 106.1* 106.6*  PLT 233 230 244 212 225 235   Blood Culture    Component Value Date/Time   SDES PERITONEAL 10/03/2024 0352   SPECREQUEST FLUID 10/03/2024 0352   CULT PENDING 10/03/2024 0352   REPTSTATUS PENDING 10/03/2024 0352   Medications:  dialysis solution 1.5% low-MG/low-CA      allopurinol   100 mg Oral Daily   aspirin   81  mg Oral Daily   budesonide  (PULMICORT ) nebulizer solution  0.25 mg Nebulization BID   busPIRone   10 mg Oral BID   calcitRIOL   0.25 mcg Oral Once per day on Monday Wednesday Friday   darbepoetin (ARANESP ) injection - DIALYSIS  100 mcg Subcutaneous Q Mon-1800   dolutegravir   50 mg Oral Daily   emtricitabine -tenofovir  AF  1 tablet Oral Daily   famotidine   10 mg Oral Daily   feeding supplement  237 mL Oral BID BM   fludrocortisone   0.1 mg Oral Daily   gentamicin  cream  1 Application Topical Daily   hydrocortisone   15 mg Oral BID   insulin  aspart  0-5 Units Subcutaneous QHS   insulin  aspart  0-6 Units Subcutaneous TID WC   levothyroxine   150 mcg Oral Q0600   lidocaine   1 patch Transdermal Q24H   loratadine   10 mg Oral QHS   melatonin  5 mg Oral QHS   metoprolol  succinate  12.5 mg Oral Daily  mirtazapine   7.5 mg Oral QHS   multivitamin  1 tablet Oral QHS   nystatin   5 mL Oral QID   pantoprazole   40 mg Oral Daily   rosuvastatin   10 mg Oral Daily   saccharomyces boulardii  250 mg Oral Daily   sevelamer  carbonate  1,600 mg Oral TID WC   Pt summary: 58 year old female with ESRD on PD, T2DM, prior aortic dissection s/p endovascular repair, history of DVT, HIV, HTN, Hx metastatic papillary thyroid  cancer (s/p thyroidectomy), PVD, sarcoid and prior pancreatitis admitted to the hospital 09/08/24 with vomiting and diarrhea. During that hospitalization MRIs showed an expansive sellar mass favoring pituitary macroadenoma with concern for hemorrhage and neurosurgery discharged home with steroids. She was readmitted on 09/10/2024 at which point she was having dysphagia. She was also diagnosed with C. difficile antigen and treated with vancomycin . She also had an EGD which showed a normal esophagus empirically dilated to 20 mm. Biopsies of the stomach showed mild inflammation without H. pylori. Then readmitted 12/8 with abdominal and back pain found to be hypotensive, was diagnosed with NSTEMI and adrenal  apoplexy/insufficiency. Now in CIR.  Dialysis Orders [facility] - [facility] 7d/week, 5 exchanges, fill vol , 1.5hr dwell time, EDW 74.5kg - Typically 1/2 1.5%, 1/2 2.5% dextrose   Assessment/Plan: Debility: In CIR, rehab ongoing. ESRD: Continue PD nightly. Using home Rx - half 1.5%, half 2.5% dextrose . 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 Leukocytosis: Likely due to steroids, PD fluid cell count normal - no peritonitis. HTN/volume: BP stable, no edema - continue same meds. Anemia of ESRD: Hgb 7.7 - on Aranesp , will ^ next dose. FOBT positive. GI consulted, no plan for scope yet. Secondary HPTH: CorrCa ok, Phos high - continue binders + VDRA. Nutrition: Alb low, on supps. HIV Hypothyroidism   Izetta Boehringer, [name] 10/04/2024, 10:32 AM  [facility]    "

## 2024-10-04 NOTE — Plan of Care (Signed)
" °  Problem: Consults Goal: RH GENERAL PATIENT EDUCATION Description: See Patient Education module for education specifics. Outcome: Progressing   Problem: RH BOWEL ELIMINATION Goal: RH STG MANAGE BOWEL WITH ASSISTANCE Description: STG Manage Bowel with Assistance. Outcome: Progressing   Problem: RH SKIN INTEGRITY Goal: RH STG SKIN FREE OF INFECTION/BREAKDOWN Description: Manage skin free of infection with mod I assistance Outcome: Progressing   Problem: RH SAFETY Goal: RH STG ADHERE TO SAFETY PRECAUTIONS W/ASSISTANCE/DEVICE Description: STG Adhere to Safety Precautions With mod I Assistance/Device. Outcome: Progressing   Problem: RH PAIN MANAGEMENT Goal: RH STG PAIN MANAGED AT OR BELOW PT'S PAIN GOAL Description: <4 w/ prns Outcome: Progressing   Problem: RH KNOWLEDGE DEFICIT GENERAL Goal: RH STG INCREASE KNOWLEDGE OF SELF CARE AFTER HOSPITALIZATION Description: Manage increase knowledge of self care after hospitalization with mod I assistance from family using educational materials provided Outcome: Progressing   Problem: Education: Goal: Ability to describe self-care measures that may prevent or decrease complications (Diabetes Survival Skills Education) will improve Outcome: Progressing Goal: Individualized Educational Video(s) Outcome: Progressing   Problem: Coping: Goal: Ability to adjust to condition or change in health will improve Outcome: Progressing   Problem: Fluid Volume: Goal: Ability to maintain a balanced intake and output will improve Outcome: Progressing   Problem: Health Behavior/Discharge Planning: Goal: Ability to identify and utilize available resources and services will improve Outcome: Progressing Goal: Ability to manage health-related needs will improve Outcome: Progressing   Problem: Metabolic: Goal: Ability to maintain appropriate glucose levels will improve Outcome: Progressing   Problem: Nutritional: Goal: Maintenance of adequate  nutrition will improve Outcome: Progressing Goal: Progress toward achieving an optimal weight will improve Outcome: Progressing   Problem: Skin Integrity: Goal: Risk for impaired skin integrity will decrease Outcome: Progressing   Problem: Tissue Perfusion: Goal: Adequacy of tissue perfusion will improve Outcome: Progressing   "

## 2024-10-04 NOTE — Progress Notes (Signed)
 Occupational Therapy Session Note  Patient Details  Name: [name] MRN: [id number] Date of Birth: [birth year]  Today's Date: 10/04/2024 OT Individual Time: 1300-1345 OT Individual Time Calculation (min): 45 min    Short Term Goals: Week 1:  OT Short Term Goal 1 (Week 1): The pt will safely complete LB bathing and dressing with Min/ ModI using AE as needed. OT Short Term Goal 2 (Week 1): The pt will safely complete toileting with Min/ ModI using AE as needed. OT Short Term Goal 3 (Week 1): The pt will complete safely complete tub/shower transfers using AE with Min/ ModI after demonstration and initial cues. OT Short Term Goal 4 (Week 1): The pt will safely complete  OT therapy 85% of the time with not more than 3 rest breaks. OT Short Term Goal 5 (Week 1): The pt will safely ambulate for function using a  RW with closeS and additional time.  Skilled Therapeutic Interventions/Progress Updates:  Patient agreeable to participate in OT session. Reports 5/10 pain level premedicated, nausea, and fatigue but states she will work through it.   Patient participated in skilled OT session focusing on standing tolerance, activity tolerance. Patient received in recliner. Patient able to complete UBE on level 1, 30 rpm, 8 minutes to increase UE strength and increase activity tolerance. Patient reported nausea throughout session leading to increase in breaks required. Patient completed dynamic balance and transfer training from wc to mat table with SUP for transfer. Patient able to complete with good skill and minimal fatigue. Patient completed BITS standing activity to increase standing tolerance, and functional reach. Reported this increased nausea. Patient then transferred back to room, completed doffing of clothes SUP to CGA and changing into hospital gown SU with good dynamic balance for dialysis. Patient left in bed alarm on all needs in reach. Therapy Documentation Precautions:   Precautions Precautions: Fall Recall of Precautions/Restrictions: Intact Precaution/Restrictions Comments: PD catheter at mid lower abdomen, triple lumen central line in R jugular, on ART for HIV(+) Restrictions Weight Bearing Restrictions Per Provider Order: No  Therapy/Group: Individual Therapy  D'mariea L Harlow Carrizales 10/04/2024, 4:45 PM

## 2024-10-04 NOTE — Progress Notes (Signed)
 Patient ID: [name], female   DOB: 10-Aug-1966, 58 y.o.   MRN: [id number]  Have sent resumption of therapies to [facility] who was seeing pt prior to admission. Has rolling walker and will get tub bench on own since not covered. Work toward discharge 12/23.

## 2024-10-04 NOTE — Progress Notes (Signed)
 Physical Therapy Session Note  Patient Details  Name: [name] MRN: [id number] Date of Birth: Jan 19, 1966  Today's Date: 10/04/2024 PT Individual Time: 8898-8845 and 1415-1456 PT Individual Time Calculation (min): 53 min and 41 min  Short Term Goals: Week 1:  PT Short Term Goal 1 (Week 1): Pt will perform all standing transfers with SBA. PT Short Term Goal 2 (Week 1): Pt will ambulate at least 60 ft per bout using LRAD with overall SBA demonstrating improved activity tolerance. PT Short Term Goal 3 (Week 1): Pt will initiate stair training. PT Short Term Goal 4 (Week 1): Pt will perform appropriate outcome measure.  Skilled Therapeutic Interventions/Progress Updates:   Treatment Session 1 Received pt sitting EOB - handoff from OT. Pt agreeable to PT treatment and reported pain in R low back radiating down leg - RN notified. Session with emphasis on functional mobility/transfers, generalized strengthening and endurance, dynamic standing balance/coordination, and gait training. Pt performed all transfers with RW and CGA/close supervision throughout session. Stood and pulled pants over hips without assist, then transported to/from room in [facility] dependently for energy conservation purposes.   Pt ambulated 158ft with RW and CGA/close supervision with multiple standing rest breaks due to fatigue and pain in back. Pt then performed the following standing exercises with emphasis on LE strength/ROM. Noted pt with poor eccentric control when sitting, requiring cues to reach back and for controlled descent into chair.  -alternating marches with 1.5lb ankle weights 2x10 bilaterally -hip abduction with 1.5lb ankle weights 2x10 bilaterally -heel raises 2x10  Pt ambulated additional 59ft with RW and close supervision then RN arrived to administer medication. Pt politely declined any further activity and requested to return to room due to fatigue. Ambulated 64ft with RW and close supervision back to  bed and concluded session with pt long sitting in bed, needs within reach, and bed alarm on. Provided pt with heat pack for R low back for pain relief.   Treatment Session 2 Received pt sitting in [facility] with family at bedside. Pt agreeable to PT treatment and reported pain 4/10 in R low back radiating down leg (premedicated). Session with emphasis on functional mobility/transfers, generalized strengthening and endurance, toileting, and ambulation. Pt transported to/from room in [facility] dependently for time management purposes. Pt reported extreme fatigue (8/10) from previous PT session and requested to take it easy this afternoon.  Pt performed all transfers with RW and close supervision throughout session. Pt performed BUE/BLE strengthening on Nustep at workload 1 for 6 minutes with emphasis on cardiovascular endurance - total of 204 steps, 0.1 iles, 29 SPM, 1.3 METs. Pt reported urge to toilet and feeling nauseous. Returned to room and ambulated in/out of bathroom with RW and close supervision. Pt able to manage clothing without assist, continent of bowel, and able to perform hygiene management without assist. Due to fatigue, pt performed the following seated exercises with emphasis on LE strength/ROM: -LAQ 2x12 bilaterally  -hip flexion 2x12 bilaterally -hip abduction with yellow TB 2x12 RN present to administer anti-nausea medication and concluded session with pt sitting in [facility] with all needs within reach.  Therapy Documentation Precautions:  Precautions Precautions: Fall Recall of Precautions/Restrictions: Intact Precaution/Restrictions Comments: PD catheter at mid lower abdomen, triple lumen central line in R jugular, on ART for HIV(+) Restrictions Weight Bearing Restrictions Per Provider Order: No  Therapy/Group: Individual Therapy Therisa HERO Zaunegger Therisa Stains PT, DPT 10/04/2024, 7:09 AM

## 2024-10-04 NOTE — Progress Notes (Signed)
 Patient alert and oriented x's 4, Peritoneal dialysis treatment initiated.   10/04/24 2000  Peritoneal Catheter Mid lower abdomen  Placement Date/Time: 01/10/24 1213   Serial / Lot #: [id number]  Expiration Date: 04/15/28  Procedural Verification: Medical records & consent reviewed;Relevant studies,results and images reviewed;Site marked with initials  Time out: Correct Patient;Corre...  Site Assessment Clean, Dry, Intact  Drainage Description None  Catheter status Accessed;Patent  Dressing Gauze/Drain sponge  Dressing Status Clean, Dry, Intact  Dressing Intervention New dressing/dressing changed  Cycler Setup  Total Number of Night Cycles 5  Night Fill Volume 2250  Dianeal  Solution Dextrose  1.5% in 6000 mL Low Cal/Low Mag (2.5 in 6000)  Night Dwell Time per Cycle - Hour(s) 1  Night Dwell Time per Cycle - Minute(s) 30  Night Time Therapy - Minute(s) 12  Night Time Therapy - Hour(s) 10  Minimum Initial Drain Volume 0  Maximum Peritoneal Volume 2250  Night/Total Therapy Volume [id number]  Day Exchange No  Completion  Treatment Status Started

## 2024-10-04 NOTE — Telephone Encounter (Unsigned)
 Copied from CRM [id number]. Topic: General - Other >> Oct 04, 2024  9:33 AM Lonell PEDLAR wrote: Reason for CRM: fyi: Needs medical forms completed, will be faxing over, this will be for weekly trash assistance.

## 2024-10-04 NOTE — Progress Notes (Signed)
 "                                                        PROGRESS NOTE   Subjective/Complaints:  Pt reports feeling good-  Slept great with low dose 25 mg Trazodone .   LBM 2 days ago .   ROS:    Pt denies SOB, abd pain, CP, N/V/C/D, and vision changes   Per HPI +Back pain controlled usually   Objective:   No results found.  Recent Labs    10/03/24 0507 10/04/24 0606  WBC 17.5* 14.8*  HGB 7.8* 7.7*  HCT 22.7* 22.5*  PLT 225 235   Recent Labs    10/03/24 0507 10/04/24 0606  NA 136 136  K 3.7 3.7  CL 94* 94*  CO2 25 23  GLUCOSE 94 96  BUN 57* 55*  CREATININE 11.70* 11.70*  CALCIUM  8.8* 8.7*    Intake/Output Summary (Last 24 hours) at 10/04/2024 0856 Last data filed at 10/04/2024 0723 Gross per 24 hour  Intake --  Output 685 ml  Net -685 ml        Physical Exam: Vital Signs Blood pressure 117/60, pulse 83, temperature 97.8 F (36.6 C), temperature source Oral, resp. rate 17, height 5' 5 (1.651 m), weight 78.7 kg, last menstrual period 03/31/2014, SpO2 98%.     General: awake, alert, appropriate, supine in bed- getting taken off PD by RN,  NAD HENT: conjugate gaze; oropharynx moist CV: regular rate and rhythm; no JVD Pulmonary: CTA B/L; no W/R/R- good air movement GI: soft, NT, ND, (+)BS- PD catheter in place Psychiatric: appropriate- interactive Neurological: Ox3   Skin: PD site clean, dressed. A few scattered bruises R neck CVL out-  Neuro:   Alert and awake. Normal insight and awareness. Normal language and speech. Cranial nerve exam unremarkable. Moving all 4 extremities to gravity and resistance. Stocking glove sensory loss below ankles and to a lesser extent in both hands.  No abnl resting tone.    Assessment/Plan: 1. Functional deficits which require 3+ hours per day of interdisciplinary therapy in a comprehensive inpatient rehab setting. Physiatrist is providing close team supervision and 24 hour management of active medical  problems listed below. Physiatrist and rehab team continue to assess barriers to discharge/monitor patient progress toward functional and medical goals  Care Tool:  Bathing    Body parts bathed by patient: Right arm, Left arm, Chest, Abdomen, Front perineal area, Buttocks, Right upper leg, Left upper leg, Face, Right lower leg, Left lower leg   Body parts bathed by helper: Left lower leg, Right lower leg     Bathing assist Assist Level: Contact Guard/Touching assist     Upper Body Dressing/Undressing Upper body dressing   What is the patient wearing?: Pull over shirt    Upper body assist Assist Level: Minimal Assistance - Patient > 75%    Lower Body Dressing/Undressing Lower body dressing      What is the patient wearing?: Pants     Lower body assist Assist for lower body dressing: Contact Guard/Touching assist     Toileting Toileting    Toileting assist Assist for toileting: Minimal Assistance - Patient > 75%     Transfers Chair/bed transfer  Transfers assist     Chair/bed transfer assist level: Minimal Assistance - Patient > 75%  Locomotion Ambulation   Ambulation assist      Assist level: Minimal Assistance - Patient > 75% Assistive device: Hand held assist Max distance: 20 ft   Walk 10 feet activity   Assist     Assist level: Contact Guard/Touching assist Assistive device: Walker-rolling   Walk 50 feet activity   Assist Walk 50 feet with 2 turns activity did not occur: Safety/medical concerns         Walk 150 feet activity   Assist Walk 150 feet activity did not occur: Safety/medical concerns         Walk 10 feet on uneven surface  activity   Assist Walk 10 feet on uneven surfaces activity did not occur: Safety/medical concerns         Wheelchair     Assist Is the patient using a wheelchair?: Yes (may not require by d/c for use at home) Type of Wheelchair: Manual    Wheelchair assist level: Dependent -  Patient 0% Max wheelchair distance: 300 ft    Wheelchair 50 feet with 2 turns activity    Assist        Assist Level: Dependent - Patient 0%   Wheelchair 150 feet activity     Assist      Assist Level: Dependent - Patient 0%   Blood pressure 117/60, pulse 83, temperature 97.8 F (36.6 C), temperature source Oral, resp. rate 17, height 5' 5 (1.651 m), weight 78.7 kg, last menstrual period 03/31/2014, SpO2 98%.   Medical Problem List and Plan: 1. Functional deficits secondary to debility related to circulatory shock, adrenal insufficiency/pituitary apoplexy, new CHF             -patient may  shower             -ELOS/Goals: 7-10 days, mod I goals.              -family is looking at some options for intermittent assistance at home  - Head CT completed last night 12/13 when she bumped her head, no acute abnormalities   D/C 12/23  Con't CIR PT and OT  WBC down to 14.8 2.  Antithrombotics: -DVT/anticoagulation:  Mechanical: Sequential compression devices, below knee Bilateral lower extremities Pharmaceutical: Heparin   12/16- Heparin  held due to dropping Hb             -antiplatelet therapy: Aspirin     3. Pain Management:  Tylenol  and Oxycodone  prn.   12/16- denies pain this AM- con't regimen 4. Mood/Behavior/Sleep: LCSW to follow for evaluation and support when available.              -antipsychotic agents: Buspar  10 mg BID prn for anxiety and Remeron  7.5 mg at bedtime.              -pt seems positive. Family very supportive  12/18- poor sleep- added Melatonin and has Trazodone  25 mg prn if need be  12/19 slept great with 25 mg Trazodone  last night- con't prn 5. Neuropsych/cognition: This patient is capable of making decisions on her own behalf.   6. Skin/Wound Care: Routine pressure relief measures.   12/17- Pulled CVL since appeared like getting infected 7. Fluids/Electrolytes/Nutrition: Monitor I&O and weight. Follow up labs CBC/CMP               -GERD:  Omeprazole  40 mg bid    8. 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    12/18- changed to PO to get rid  of CVL- also could be cause of leukocytosis?  12/19- WBC doing better 9.ESRD: Continue nightly PD per nephrology.              -pt was independent with management of her PD at home  -12/14, discussed Renvela  with nephrology, they plan to resume  12/16- K+ 3.2- per renal-   12/18-12/19-  K+ still  3.7 10.Hx of papillary thyroid  cancer s/p thyroidectomy: TSH normal, continue Synthroid .    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    12. HFrEF, without acute exacerbation: Nephrology to manage volume status. Cardiology following. Continue heart healthy diet and GDMT as tolerated. Daily weights-appears stable  12/18- Weight 77.7 kg- doing well  12/19- WBC 78.7 kg- overall stable Filed Weights   10/02/24 0500 10/03/24 0447 10/04/24 0548  Weight: 78.7 kg 77.7 kg 78.7 kg      13. Low back pain/Abdominal pain: present since aortic dissection 11/24. Continue to monitor. MRI T and L spine negative, RUQ US  negative for acute findings.              -continue Lidocaine  patch              -pt is able to work through discomfort  12/16- Been having abd pain/back pain for 2 weeks- CT didn't show any acute pathology- did show Cirrhosis, cholelithiasis and B/L renal stones.  14. HTN: Monitor BP per protocol, continue Metoprolol    12/16- 12/19- BP controlled- looks good- con't regimen      10/04/2024    8:26 AM 10/04/2024    5:48 AM 10/04/2024    5:46 AM  Vitals with BMI  Weight  173 lbs 8 oz   BMI  28.87   Systolic   117  Diastolic   60  Pulse 83  83    15. HLD: Crestor  10 mg    16. HIV: CD4 445 on 10/15. Continue home [name]  and Descovy               12/16- will call ID about rising WBC- esp in light of HIV  12/17- d/w ID- they feel HIV is not contributing to  issues 17. T2DM: Monitor glucose ac/hs with SSI.              -associated peripheral neuropathy  12/19- CBG's 76-120's- a little low, but haven't changed regimen in days and was 190's the day prior- won't change regimen   -CBG (last 3)  Recent Labs    10/03/24 1733 10/03/24 2004 10/04/24 0545  GLUCAP 76 105* 120*   18. Chronic nausea/poor appetite  -12/13 Ate 50%, continue monitor intake  -Continue Remaron  12/17- Still poor appetite- but taking supplements when needed  12/19- eating a little better per pt  19. Diarrhea  -12/13 LBM recorded yesterday type 4, medium, continue to monitor   -pt interested in trying probiotics, will start  12/16- Denies diarrhea- LBM yesterday  12/17- Pt has had 2 formed and 2 mushy stools in last 5 days- LBM 2 days ago- on enteric precautions since was found to have C diff (-) in 11/25- pending C diff when pt has BM  12/18- off C diff enteric precautions- No Sx's of C diff- also LBM formed yesterday evening  12/19- Diarrhea resolved- LBM 2 days ago  20. Leukocytosis  12/15- WBC 15.3 up from 13k -will check if voids at all- and order U/A and Cx if so and check CXR- will also  order labs for AM- and  might need to remove CVL in R neck-   12/16- got Blood C'x- so far, nothing back yet- WBC up somewhat to 15.8- from 15.3- will call ID- is anuric, so don't have source there and CXR was (-) for acute issues. - spoke to ID- they don't feel it's worrisome, but keep an eye on Cx's- and manage if clinically gets worse. 12/17- Pulled CVL on R neck- wasn't looking well- Cultures so far 2 days (-)  12/18- Cultures day 3 (-) and peritoneal fluid has no WBCs or organisms so far- done 3am today 12/19- Cx pending on peritoneal fluids, but no WBCs or organisms- WBC down to 14.8, so trending down- happy to see this- again, asymptomatic -con't to monitor 21. Acute on chronic anemia  12/15- Due to ESRD- but also dropped almost 1 unit since Saturday- which is 1 unit lower than  average last 1 week. Will check CT of abd/pelvis and hemoccult- also reached out to Renal about this.   12/16- CT was (-) for acute pathology that would explain- Hemoccult (+)- stopped Heparin  SQ- and GI called- will check into this.  Also Hb up somewhat to 8.1 today  12/17- GI wants C diff checked- with PCR- could be colitis? Don't feel colonoscopy will add much they will consider colonoscopy if Hb continues to drift down with no other source  12/18- Hb 7.8 today- has dropped a little- hemoccult (+) x2- but also has hemorrhoids it appears-will con't to check daily and make sure doesn't drop more.    12/19- HB 7.7- stable for 2 days- will con't to monitor   I spent a total of 43   minutes on total care today- >50% coordination of care- due to  D/w pt and nursing about pt- also reviewed labs, vitals and orders- and B/B LOS: 7 days A FACE TO FACE EVALUATION WAS PERFORMED  Hubert Raatz 10/04/2024, 8:56 AM     "

## 2024-10-05 LAB — CBC
HCT: 26.3 % — ABNORMAL LOW (ref 36.0–46.0)
Hemoglobin: 9 g/dL — ABNORMAL LOW (ref 12.0–15.0)
MCH: 37.2 pg — ABNORMAL HIGH (ref 26.0–34.0)
MCHC: 34.2 g/dL (ref 30.0–36.0)
MCV: 108.7 fL — ABNORMAL HIGH (ref 80.0–100.0)
Platelets: 274 K/uL (ref 150–400)
RBC: 2.42 MIL/uL — ABNORMAL LOW (ref 3.87–5.11)
RDW: 16.3 % — ABNORMAL HIGH (ref 11.5–15.5)
WBC: 14.6 K/uL — ABNORMAL HIGH (ref 4.0–10.5)
nRBC: 3.6 % — ABNORMAL HIGH (ref 0.0–0.2)

## 2024-10-05 LAB — GLUCOSE, CAPILLARY
Glucose-Capillary: 108 mg/dL — ABNORMAL HIGH (ref 70–99)
Glucose-Capillary: 91 mg/dL (ref 70–99)
Glucose-Capillary: 109 mg/dL — ABNORMAL HIGH (ref 70–99)
Glucose-Capillary: 114 mg/dL — ABNORMAL HIGH (ref 70–99)

## 2024-10-05 LAB — CULTURE, BLOOD (ROUTINE X 2)
Culture: NO GROWTH
Culture: NO GROWTH

## 2024-10-05 NOTE — Progress Notes (Signed)
 "                                                        PROGRESS NOTE   Subjective/Complaints: No new complaints this morning Appreciate nephrology following Hypertensive and tachycardic, has PD tonight   ROS:  Pt denies SOB, abd pain, CP, N/V/C/D, and vision changes   Per HPI +Back pain controlled usually   Objective:   No results found.  Recent Labs    10/04/24 0606 10/05/24 0535  WBC 14.8* 14.6*  HGB 7.7* 9.0*  HCT 22.5* 26.3*  PLT 235 274   Recent Labs    10/03/24 0507 10/04/24 0606  NA 136 136  K 3.7 3.7  CL 94* 94*  CO2 25 23  GLUCOSE 94 96  BUN 57* 55*  CREATININE 11.70* 11.70*  CALCIUM  8.8* 8.7*    Intake/Output Summary (Last 24 hours) at 10/05/2024 1719 Last data filed at 10/05/2024 0734 Gross per 24 hour  Intake 118 ml  Output 703 ml  Net -585 ml        Physical Exam: Vital Signs Blood pressure (!) 146/86, pulse (!) 109, temperature 97.7 F (36.5 C), temperature source Oral, resp. rate 18, height 5' 5 (1.651 m), weight 78.7 kg, last menstrual period 03/31/2014, SpO2 100%. General: awake, alert, appropriate, sitting in chair, NAD HENT: conjugate gaze; oropharynx moist CV: regular rate and rhythm; no JVD Pulmonary: CTA B/L; no W/R/R- good air movement GI: soft, NT, ND, (+)BS- PD catheter in place Psychiatric: appropriate- interactive  PRIOR EXAMS: Neurological: Ox3 Skin: PD site clean, dressed. A few scattered bruises R neck CVL out-  Neuro:   Alert and awake. Normal insight and awareness. Normal language and speech. Cranial nerve exam unremarkable. Moving all 4 extremities to gravity and resistance. Stocking glove sensory loss below ankles and to a lesser extent in both hands.  No abnl resting tone.    Assessment/Plan: 1. Functional deficits which require 3+ hours per day of interdisciplinary therapy in a comprehensive inpatient rehab setting. Physiatrist is providing close team supervision and 24 hour management of active  medical problems listed below. Physiatrist and rehab team continue to assess barriers to discharge/monitor patient progress toward functional and medical goals  Care Tool:  Bathing    Body parts bathed by patient: Right arm, Left arm, Chest, Abdomen, Front perineal area, Buttocks, Right upper leg, Left upper leg, Face, Right lower leg, Left lower leg   Body parts bathed by helper: Left lower leg, Right lower leg     Bathing assist Assist Level: Contact Guard/Touching assist     Upper Body Dressing/Undressing Upper body dressing   What is the patient wearing?: Pull over shirt    Upper body assist Assist Level: Minimal Assistance - Patient > 75%    Lower Body Dressing/Undressing Lower body dressing      What is the patient wearing?: Pants     Lower body assist Assist for lower body dressing: Contact Guard/Touching assist     Toileting Toileting    Toileting assist Assist for toileting: Minimal Assistance - Patient > 75%     Transfers Chair/bed transfer  Transfers assist     Chair/bed transfer assist level: Contact Guard/Touching assist     Locomotion Ambulation   Ambulation assist      Assist level: Contact Guard/Touching assist  Assistive device: Walker-rolling Max distance: 165ft   Walk 10 feet activity   Assist     Assist level: Contact Guard/Touching assist Assistive device: Walker-rolling   Walk 50 feet activity   Assist Walk 50 feet with 2 turns activity did not occur: Safety/medical concerns  Assist level: Contact Guard/Touching assist Assistive device: Walker-rolling    Walk 150 feet activity   Assist Walk 150 feet activity did not occur: Safety/medical concerns  Assist level: Contact Guard/Touching assist Assistive device: Walker-rolling    Walk 10 feet on uneven surface  activity   Assist Walk 10 feet on uneven surfaces activity did not occur: Safety/medical concerns         Wheelchair     Assist Is the patient  using a wheelchair?: Yes (may not require by d/c for use at home) Type of Wheelchair: Manual    Wheelchair assist level: Dependent - Patient 0% Max wheelchair distance: 300 ft    Wheelchair 50 feet with 2 turns activity    Assist        Assist Level: Dependent - Patient 0%   Wheelchair 150 feet activity     Assist      Assist Level: Dependent - Patient 0%   Blood pressure (!) 146/86, pulse (!) 109, temperature 97.7 F (36.5 C), temperature source Oral, resp. rate 18, height 5' 5 (1.651 m), weight 78.7 kg, last menstrual period 03/31/2014, SpO2 100%.   Medical Problem List and Plan: 1. Functional deficits secondary to debility related to circulatory shock, adrenal insufficiency/pituitary apoplexy, new CHF             -patient may  shower             -ELOS/Goals: 7-10 days, mod I goals.              -family is looking at some options for intermittent assistance at home  - Head CT completed last night 12/13 when she bumped her head, no acute abnormalities   D/C 12/23  Con't CIR PT and OT  WBC down to 14.8 2.  Antithrombotics: -DVT/anticoagulation:  Mechanical: Sequential compression devices, below knee Bilateral lower extremities Pharmaceutical: Heparin   12/16- Heparin  held due to dropping Hb             -antiplatelet therapy: Aspirin     3. Pain Management:  Tylenol  and Oxycodone  prn.   12/16- denies pain this AM- con't regimen 4. Mood/Behavior/Sleep: LCSW to follow for evaluation and support when available.              -antipsychotic agents: Buspar  10 mg BID prn for anxiety and Remeron  7.5 mg at bedtime.              -pt seems positive. Family very supportive  12/18- poor sleep- added Melatonin and has Trazodone  25 mg prn if need be  12/19 slept great with 25 mg Trazodone  last night- con't prn 5. Neuropsych/cognition: This patient is capable of making decisions on her own behalf.   6. Skin/Wound Care: Routine pressure relief measures.   12/17- Pulled CVL  since appeared like getting infected 7. Fluids/Electrolytes/Nutrition: Monitor I&O and weight. Follow up labs CBC/CMP               -GERD: Omeprazole  40 mg bid    8. 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    12/18- changed to PO to get rid of CVL- also could be cause of leukocytosis?  12/19- WBC doing  better 9.ESRD: Continue nightly PD per nephrology.              -pt was independent with management of her PD at home  -12/14, discussed Renvela  with nephrology, they plan to resume  12/16- K+ 3.2- per renal-   12/18-12/19-  K+ still  3.7 10.Hx of papillary thyroid  cancer s/p thyroidectomy: TSH normal, continue Synthroid .    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    12. HFrEF, without acute exacerbation: Nephrology to manage volume status. Cardiology following. Continue heart healthy diet and GDMT as tolerated. Daily weights-appears stable  12/18- Weight 77.7 kg- doing well  12/19- WBC 78.7 kg- overall stable Filed Weights   10/02/24 0500 10/03/24 0447 10/04/24 0548  Weight: 78.7 kg 77.7 kg 78.7 kg      13. Low back pain/Abdominal pain: present since aortic dissection 11/24. Continue to monitor. MRI T and L spine negative, RUQ US  negative for acute findings.              -continue Lidocaine  patch              -pt is able to work through discomfort  12/16- Been having abd pain/back pain for 2 weeks- CT didn't show any acute pathology- did show Cirrhosis, cholelithiasis and B/L renal stones.  14. HTN: Monitor BP per protocol, continue Metoprolol         10/05/2024    2:01 PM 10/05/2024    5:47 AM 10/04/2024    8:20 PM  Vitals with BMI  Systolic 146 141 855  Diastolic 86 74 73  Pulse 109 81 83    15. HLD: continue Crestor  10 mg    16. HIV: CD4 445 on 10/15. Continue home [name]  and Descovy               12/16- will call ID about rising WBC- esp in light  of HIV  12/17- d/w ID- they feel HIV is not contributing to issues  17. T2DM: Monitor glucose ac/hs with SSI.              -associated peripheral neuropathy  Highest CBG 108- will d/c HS correction scale   -CBG (last 3)  Recent Labs    10/05/24 0615 10/05/24 1130 10/05/24 1634  GLUCAP 108* 91 109*   18. Chronic nausea/poor appetite  -12/13 Ate 50%, continue monitor intake  -Continue Remaron  12/17- Still poor appetite- but taking supplements when needed  12/19- eating a little better per pt  19. Diarrhea  -12/13 LBM recorded yesterday type 4, medium, continue to monitor   -pt interested in trying probiotics, will start  12/16- Denies diarrhea- LBM yesterday  12/17- Pt has had 2 formed and 2 mushy stools in last 5 days- LBM 2 days ago- on enteric precautions since was found to have C diff (-) in 11/25- pending C diff when pt has BM  12/18- off C diff enteric precautions- No Sx's of C diff- also LBM formed yesterday evening  12/19- Diarrhea resolved- LBM 2 days ago  20. Leukocytosis  12/15- WBC 15.3 up from 13k -will check if voids at all- and order U/A and Cx if so and check CXR- will also order labs for AM- and  might need to remove CVL in R neck-   12/16- got Blood C'x- so far, nothing back yet- WBC up somewhat to 15.8- from 15.3- will call ID- is anuric, so don't have source there and CXR  was (-) for acute issues. - spoke to ID- they don't feel it's worrisome, but keep an eye on Cx's- and manage if clinically gets worse. 12/17- Pulled CVL on R neck- wasn't looking well- Cultures so far 2 days (-)  12/18- Cultures day 3 (-) and peritoneal fluid has no WBCs or organisms so far- done 3am today 12/19- Cx pending on peritoneal fluids, but no WBCs or organisms- WBC down to 14.8, so trending down- happy to see this- again, asymptomatic -con't to monitor  Cx reviewed and appears wnl  21. Acute on chronic anemia  12/15- Due to ESRD- but also dropped almost 1 unit since Saturday- which is  1 unit lower than average last 1 week. Will check CT of abd/pelvis and hemoccult- also reached out to Renal about this.   12/16- CT was (-) for acute pathology that would explain- Hemoccult (+)- stopped Heparin  SQ- and GI called- will check into this.  Also Hb up somewhat to 8.1 today  12/17- GI wants C diff checked- with PCR- could be colitis? Don't feel colonoscopy will add much they will consider colonoscopy if Hb continues to drift down with no other source  12/18- Hb 7.8 today- has dropped a little- hemoccult (+) x2- but also has hemorrhoids it appears-will con't to check daily and make sure doesn't drop more.    12/19- HB 7.7- stable for 2 days- will con't to monitor   8 days A FACE TO FACE EVALUATION WAS PERFORMED  Sven SQUIBB Eleah Lahaie 10/05/2024, 5:19 PM     "

## 2024-10-05 NOTE — Progress Notes (Incomplete)
 [name]

## 2024-10-05 NOTE — Progress Notes (Signed)
" °   10/05/24 0700  Peritoneal Catheter Mid lower abdomen  Placement Date/Time: 01/10/24 1213   Serial / Lot #: [id number]  Expiration Date: 04/15/28  Procedural Verification: Medical records & consent reviewed;Relevant studies,results and images reviewed;Site marked with initials  Time out: Correct Patient;Corre...  Site Assessment Clean, Dry, Intact  Drainage Description None  Catheter status Deaccessed;Capped  Dressing Gauze/Drain sponge  Dressing Status Clean, Dry, Intact  Completion  Treatment Status Complete  Initial Drain Volume 55  Average Dwell Time-Min(s) 90  Average Drain Time 27  Total Therapy Volume 88748  Total Therapy Time-Hour(s) 10  Total Therapy Time-Min(s) 42  Effluent Appearance Amber  Fluid Balance - CCPD  Total UF (+ value on cycler, pt loss) 703 mL  Procedure Comments  Tolerated treatment well? Yes  Education / Care Plan  Dialysis Education Provided Yes   Peritoneal dialysis treatment completed, post treatment condition of this patient stable and report was given to the primary RN. "

## 2024-10-05 NOTE — Progress Notes (Signed)
 " [facility] Progress Note   Subjective:    Seen and examined patient at bedside. Tolerated PD overnight and noted net UF . She reports feeling puffy today. Plan for PD tonight and will use all 2.5% bags.  Objective Vitals:   10/04/24 2023 10/04/24 2100 10/05/24 0547 10/05/24 1401  BP:   (!) 141/74 (!) 146/86  Pulse:   81 (!) 109  Resp:   18 18  Temp: 97.7 F (36.5 C)  97.8 F (36.6 C) 97.7 F (36.5 C)  TempSrc: Oral  Oral Oral  SpO2:  100% 100%   Weight:      Height:       Physical Exam General: Well appearing, NAD. Room air Heart: RRR Lungs: CTAB Abdomen: mild TTP, no guarding Extremities: no LE edema Dialysis Access: PD cath in mid L ABD  Filed Weights   10/02/24 0500 10/03/24 0447 10/04/24 0548  Weight: 78.7 kg 77.7 kg 78.7 kg    Intake/Output Summary (Last 24 hours) at 10/05/2024 1544 Last data filed at 10/05/2024 0734 Gross per 24 hour  Intake 118 ml  Output 703 ml  Net -585 ml    Additional Objective Labs: Basic Metabolic Panel: Recent Labs  Lab 10/01/24 0428 10/02/24 0949 10/03/24 0507 10/04/24 0606  NA 137  --  136 136  K 3.2*  --  3.7 3.7  CL 94*  --  94* 94*  CO2 24  --  25 23  GLUCOSE 93  --  94 96  BUN 58*  --  57* 55*  CREATININE 12.31*  --  11.70* 11.70*  CALCIUM  8.5*  --  8.8* 8.7*  PHOS 7.5* 6.8*  --  5.9*   Liver Function Tests: Recent Labs  Lab 10/01/24 0428 10/04/24 0606  AST 28  --   ALT 27  --   ALKPHOS 317*  --   BILITOT 1.3*  --   PROT 6.1*  --   ALBUMIN  2.8* 3.1*   No results for input(s): LIPASE, AMYLASE in the last 168 hours. CBC: Recent Labs  Lab 10/01/24 0428 10/02/24 0949 10/03/24 0507 10/04/24 0606 10/05/24 0535  WBC 15.8* 17.6* 17.5* 14.8* 14.6*  NEUTROABS 13.3*  --   --   --   --   HGB 8.1* 8.2* 7.8* 7.7* 9.0*  HCT 23.8* 24.1* 22.7* 22.5* 26.3*  MCV 108.2* 107.6* 106.1* 106.6* 108.7*  PLT 244 212 225 235 274   Blood Culture    Component Value Date/Time   SDES  PERITONEAL 10/03/2024 0352   SPECREQUEST FLUID 10/03/2024 0352   CULT  10/03/2024 0352    NO GROWTH 2 DAYS Performed at [facility], [address], [place], [place] [address]    REPTSTATUS PENDING 10/03/2024 9647    Cardiac Enzymes: No results for input(s): CKTOTAL, CKMB, CKMBINDEX, TROPONINI in the last 168 hours. CBG: Recent Labs  Lab 10/04/24 1209 10/04/24 1648 10/04/24 2058 10/05/24 0615 10/05/24 1130  GLUCAP 91 113* 135* 108* 91   Iron  Studies: No results for input(s): IRON , TIBC, TRANSFERRIN, FERRITIN in the last 72 hours. Lab Results  Component Value Date   INR 1.1 09/23/2024   INR 1.4 (H) 08/29/2023   INR 1.4 (H) 08/28/2023   Studies/Results: No results found.  Medications:  dialysis solution 1.5% low-MG/low-CA      allopurinol   100 mg Oral Daily   aspirin   81 mg Oral Daily   budesonide  (PULMICORT ) nebulizer solution  0.25 mg Nebulization BID   busPIRone   10 mg  Oral BID   calcitRIOL   0.25 mcg Oral Once per day on Monday Wednesday Friday   darbepoetin (ARANESP ) injection - DIALYSIS  100 mcg Subcutaneous Q Mon-1800   dolutegravir   50 mg Oral Daily   emtricitabine -tenofovir  AF  1 tablet Oral Daily   famotidine   10 mg Oral Daily   feeding supplement  237 mL Oral BID BM   fludrocortisone   0.1 mg Oral Daily   gentamicin  cream  1 Application Topical Daily   hydrocortisone   15 mg Oral BID   insulin  aspart  0-5 Units Subcutaneous QHS   insulin  aspart  0-6 Units Subcutaneous TID WC   levothyroxine   150 mcg Oral Q0600   lidocaine   1 patch Transdermal Q24H   loratadine   10 mg Oral QHS   melatonin  5 mg Oral QHS   metoprolol  succinate  12.5 mg Oral Daily   mirtazapine   7.5 mg Oral QHS   multivitamin  1 tablet Oral QHS   nystatin   5 mL Oral QID   pantoprazole   40 mg Oral Daily   rosuvastatin   10 mg Oral Daily   saccharomyces boulardii  250 mg Oral Daily   sevelamer  carbonate  1,600 mg Oral TID WC    Dialysis Orders: [facility] -  [facility] 7d/week, 5 exchanges, fill vol , 1.5hr dwell time, EDW 74.5kg - Typically 1/2 1.5%, 1/2 2.5% dextrose   Assessment/Plan: Debility: In CIR, rehab ongoing. ESRD: Continue PD nightly. She reports feeling puffy today so will use all 2.5% bags. 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 Leukocytosis: Likely due to steroids, PD fluid cell count normal - no peritonitis. HTN/volume: BP stable, no edema - continue same meds. Anemia of ESRD: Aranesp  dose raised, Hgb now 9 FOBT positive. GI consulted, no plan for scope yet. Secondary HPTH: CorrCa ok, Phos high - continue binders + VDRA. Nutrition: Alb low, on supps. HIV Hypothyroidism  Charmaine Piety, NP [facility] 10/05/2024,3:44 PM  LOS: 8 days    "

## 2024-10-06 LAB — CBC
HCT: 28.5 % — ABNORMAL LOW (ref 36.0–46.0)
Hemoglobin: 9.5 g/dL — ABNORMAL LOW (ref 12.0–15.0)
MCH: 36.4 pg — ABNORMAL HIGH (ref 26.0–34.0)
MCHC: 33.3 g/dL (ref 30.0–36.0)
MCV: 109.2 fL — ABNORMAL HIGH (ref 80.0–100.0)
Platelets: 223 K/uL (ref 150–400)
RBC: 2.61 MIL/uL — ABNORMAL LOW (ref 3.87–5.11)
RDW: 16.7 % — ABNORMAL HIGH (ref 11.5–15.5)
WBC: 12.2 K/uL — ABNORMAL HIGH (ref 4.0–10.5)
nRBC: 2.4 % — ABNORMAL HIGH (ref 0.0–0.2)

## 2024-10-06 LAB — BODY FLUID CULTURE W GRAM STAIN
Culture: NO GROWTH
Gram Stain: NONE SEEN

## 2024-10-06 LAB — GLUCOSE, CAPILLARY
Glucose-Capillary: 124 mg/dL — ABNORMAL HIGH (ref 70–99)
Glucose-Capillary: 122 mg/dL — ABNORMAL HIGH (ref 70–99)
Glucose-Capillary: 101 mg/dL — ABNORMAL HIGH (ref 70–99)
Glucose-Capillary: 108 mg/dL — ABNORMAL HIGH (ref 70–99)

## 2024-10-06 MED ORDER — GENTAMICIN SULFATE 0.1 % EX CREA
1.0000 | TOPICAL_CREAM | Freq: Every day | CUTANEOUS | Status: DC
  Administered 2024-10-06 – 2024-10-08 (×3): 1 via TOPICAL
  Filled 2024-10-06: qty 15

## 2024-10-06 MED ORDER — DELFLEX-LC/2.5% DEXTROSE 394 MOSM/L IP SOLN: INTRAPERITONEAL | Status: DC

## 2024-10-06 MED ORDER — DELFLEX-LC/1.5% DEXTROSE 344 MOSM/L IP SOLN: INTRAPERITONEAL | Status: DC

## 2024-10-06 NOTE — Progress Notes (Signed)
" °   10/06/24 1025  Peritoneal Catheter Mid lower abdomen  Placement Date/Time: 01/10/24 1213   Serial / Lot #: [id number]  Expiration Date: 04/15/28  Procedural Verification: Medical records & consent reviewed;Relevant studies,results and images reviewed;Site marked with initials  Time out: Correct Patient;Corre...  Site Assessment Clean, Dry, Intact  Drainage Description None  Catheter status Deaccessed  Dressing Status Clean, Dry, Intact  Dressing Intervention Assessed, no intervention needed  Completion  Treatment Status Complete  Initial Drain Volume 33  Average Dwell Time-Hour(s) 1  Average Dwell Time-Min(s) 90  Average Drain Time 34  Total Therapy Volume 88749  Total Therapy Time-Hour(s) 12  Total Therapy Time-Min(s) 12  Weight after Drain 172 lb 6.4 oz (78.2 kg) (Standing Weight)  Effluent Appearance Clear;Yellow  Cell Count on Daytime Exchange N/A  Fluid Balance - CCPD  Total UF (+ value on cycler, pt loss) 1306 mL  Procedure Comments  Tolerated treatment well? Yes  Peritoneal Dialysis Comments Voice no complaints sitting up and eating breakfast  Education / Care Plan  Dialysis Education Provided Yes   PD post treatment note  PD treatment completed. Patient tolerated treatment well. PD effluent is clear. No specimen collected.  PD exit site clean, dry and intact. Patient is awake, oriented and in no acute distress.  Report given to bedside nurse.   Post treatment VS: 98.1,80, 16, 113/59, (73), Spo2 98%.  Total UF removed: 1306  Post treatment weight: Standing Weight 78.2 kg "

## 2024-10-06 NOTE — Progress Notes (Signed)
 Patient's family brought in pork chop and cabbage and patient able to eat and enjoy about 50% of serving.

## 2024-10-06 NOTE — Progress Notes (Signed)
 Occupational Therapy Session Note  Patient Details  Name: [name] MRN: [id number] Date of Birth: 1966/01/28  Today's Date: 10/06/2024 OT Individual Time: 1120-1202 OT Individual Time Calculation (min): 42 min    Short Term Goals: Week 2:  OT Short Term Goal 1 (Week 2): LTG=STG d/t ELOS  Skilled Therapeutic Interventions/Progress Updates:      Therapy Documentation Precautions:  Precautions Precautions: Fall Recall of Precautions/Restrictions: Intact Precaution/Restrictions Comments: PD catheter at mid lower abdomen, triple lumen central line in R jugular, on ART for HIV(+) Restrictions Weight Bearing Restrictions Per Provider Order: No General: Pt seated EOB upon OT arrival, agreeable to OT.  Pain: no pain reported  Balance: OT providing skilled intervention on static and dynamic balance strategies in order to increase independence with ADLs and reduce fall risk. Pt completing activities on BITS system with unilateral UE support on RW with CGA fading to SBA for all balance activities with UE support. Pt taking seated rest breaks for energy conservation d/t fatigue.    Pt seated in W/C at end of session with call light within reach and 4Ps assessed.    Therapy/Group: Individual Therapy  Camie Hoe, OTD, OTR/L 10/06/2024, 12:34 PM

## 2024-10-06 NOTE — Progress Notes (Signed)
 Physical Therapy Session Note  Patient Details  Name: [name] MRN: [id number] Date of Birth: 1965-11-05  Today's Date: 10/06/2024 PT Missed Time: 45 Minutes Missed Time Reason: Other (Comment) (dialysis) PT Individual Time: 8699-8656 PT Individual Time Calculation: 43 min    Short Term Goals: Week 1:  PT Short Term Goal 1 (Week 1): Pt will perform all standing transfers with SBA. PT Short Term Goal 2 (Week 1): Pt will ambulate at least 60 ft per bout using LRAD with overall SBA demonstrating improved activity tolerance. PT Short Term Goal 3 (Week 1): Pt will initiate stair training. PT Short Term Goal 4 (Week 1): Pt will perform appropriate outcome measure.  Skilled Therapeutic Interventions/Progress Updates:      Treatment Session 1   Pt ending PD upon arrival. Dialysis tech present to remove PD and endorses need for 25-30 min to complete   PT missed 45 minute 2/2 dialysis. PT will attempt to make up missed minutes as able.   Treatment Session 2   Pt seated in WC upon arrival. Pt agreeable to therpay. Pt denies any pain.   Pt ambulated 1x160, 1x85, 1x90 feet with RW and supervision, verbal cues provided for upright posture and pursed lip breathing. Pt required standing rest break intermittently and prolonged seated rest break between trails.   Discussed energy conservation techniques for safety with mobility at home with emphasis on gas tank metaphor and taking seated rest breaks as needed.   Pt ambulated up and down ramp with RW and close supervision. No evidence of imbalance.   Pt performed ambulatory transfer to car simulator at height of SUV, couch, rehab apartment queen size bed, standard chair with RW and supervision, verbal cues provided for sequencing/technique with emphasis on controlled descent versus plopping.   Pt seated in WC at end of session with all needs within reach and chair alarm on.   Therapy Documentation Precautions:   Precautions Precautions: Fall Recall of Precautions/Restrictions: Intact Precaution/Restrictions Comments: PD catheter at mid lower abdomen, triple lumen central line in R jugular, on ART for HIV(+) Restrictions Weight Bearing Restrictions Per Provider Order: No   Therapy/Group: Individual Therapy  [facility] [name], [place], DPT  10/06/2024, 7:46 AM

## 2024-10-06 NOTE — Progress Notes (Signed)
 "                                                        PROGRESS NOTE   Subjective/Complaints: Patient complains of decreased appetite and metallic taste in her mouth, requested pharmacy to please review her medications in case metallic taste is a side effect   ROS:  Pt denies SOB, abd pain, CP, N/V/C/D, and vision changes, +decreased appetite +Back pain controlled usually   Objective:   No results found.  Recent Labs    10/05/24 0535 10/06/24 0645  WBC 14.6* 12.2*  HGB 9.0* 9.5*  HCT 26.3* 28.5*  PLT 274 223   Recent Labs    10/04/24 0606  NA 136  K 3.7  CL 94*  CO2 23  GLUCOSE 96  BUN 55*  CREATININE 11.70*  CALCIUM  8.7*    Intake/Output Summary (Last 24 hours) at 10/06/2024 1224 Last data filed at 10/06/2024 1025 Gross per 24 hour  Intake 618 ml  Output 1306 ml  Net -688 ml        Physical Exam: Vital Signs Blood pressure 125/70, pulse 91, temperature 97.8 F (36.6 C), temperature source Oral, resp. rate 17, height 5' 5 (1.651 m), weight 77.5 kg, last menstrual period 03/31/2014, SpO2 100%. General: awake, alert, appropriate, sitting in up in bed and tolerating breakfast well HENT: conjugate gaze; oropharynx moist CV: regular rate and rhythm; no JVD Pulmonary: CTA B/L; no W/R/R- good air movement GI: soft, NT, ND, (+)BS- PD catheter in place Psychiatric: appropriate- interactive  PRIOR EXAMS: Neurological: Ox3 Skin: PD site clean, dressed. A few scattered bruises R neck CVL out-  Neuro:   Alert and awake. Normal insight and awareness. Normal language and speech. Cranial nerve exam unremarkable. Moving all 4 extremities to gravity and resistance. Stocking glove sensory loss below ankles and to a lesser extent in both hands.  No abnl resting tone.    Assessment/Plan: 1. Functional deficits which require 3+ hours per day of interdisciplinary therapy in a comprehensive inpatient rehab setting. Physiatrist is providing close team supervision and  24 hour management of active medical problems listed below. Physiatrist and rehab team continue to assess barriers to discharge/monitor patient progress toward functional and medical goals  Care Tool:  Bathing    Body parts bathed by patient: Right arm, Left arm, Chest, Abdomen, Front perineal area, Buttocks, Right upper leg, Left upper leg, Face, Right lower leg, Left lower leg   Body parts bathed by helper: Left lower leg, Right lower leg     Bathing assist Assist Level: Contact Guard/Touching assist     Upper Body Dressing/Undressing Upper body dressing   What is the patient wearing?: Pull over shirt    Upper body assist Assist Level: Minimal Assistance - Patient > 75%    Lower Body Dressing/Undressing Lower body dressing      What is the patient wearing?: Pants     Lower body assist Assist for lower body dressing: Contact Guard/Touching assist     Toileting Toileting    Toileting assist Assist for toileting: Minimal Assistance - Patient > 75%     Transfers Chair/bed transfer  Transfers assist     Chair/bed transfer assist level: Contact Guard/Touching assist     Locomotion Ambulation   Ambulation assist      Assist level:  Contact Guard/Touching assist Assistive device: Walker-rolling Max distance: 147ft   Walk 10 feet activity   Assist     Assist level: Contact Guard/Touching assist Assistive device: Walker-rolling   Walk 50 feet activity   Assist Walk 50 feet with 2 turns activity did not occur: Safety/medical concerns  Assist level: Contact Guard/Touching assist Assistive device: Walker-rolling    Walk 150 feet activity   Assist Walk 150 feet activity did not occur: Safety/medical concerns  Assist level: Contact Guard/Touching assist Assistive device: Walker-rolling    Walk 10 feet on uneven surface  activity   Assist Walk 10 feet on uneven surfaces activity did not occur: Safety/medical concerns          Wheelchair     Assist Is the patient using a wheelchair?: Yes (may not require by d/c for use at home) Type of Wheelchair: Manual    Wheelchair assist level: Dependent - Patient 0% Max wheelchair distance: 300 ft    Wheelchair 50 feet with 2 turns activity    Assist        Assist Level: Dependent - Patient 0%   Wheelchair 150 feet activity     Assist      Assist Level: Dependent - Patient 0%   Blood pressure 125/70, pulse 91, temperature 97.8 F (36.6 C), temperature source Oral, resp. rate 17, height 5' 5 (1.651 m), weight 77.5 kg, last menstrual period 03/31/2014, SpO2 100%.   Medical Problem List and Plan: 1. Functional deficits secondary to debility related to circulatory shock, adrenal insufficiency/pituitary apoplexy, new CHF             -patient may  shower             -ELOS/Goals: 7-10 days, mod I goals.              -family is looking at some options for intermittent assistance at home  - Head CT completed last night 12/13 when she bumped her head, no acute abnormalities   D/C 12/23  Con't CIR PT and OT  WBC down to 14.8 2.  Antithrombotics: -DVT/anticoagulation:  Mechanical: Sequential compression devices, below knee Bilateral lower extremities Pharmaceutical: Heparin   12/16- Heparin  held due to dropping Hb             -antiplatelet therapy: Aspirin     3. Pain Management:  Tylenol  and Oxycodone  prn.   12/16- denies pain this AM- con't regimen 4. Mood/Behavior/Sleep: LCSW to follow for evaluation and support when available.              -antipsychotic agents: Buspar  10 mg BID prn for anxiety and Remeron  7.5 mg at bedtime.              -pt seems positive. Family very supportive  12/18- poor sleep- added Melatonin and has Trazodone  25 mg prn if need be  12/19 slept great with 25 mg Trazodone  last night- con't prn 5. Neuropsych/cognition: This patient is capable of making decisions on her own behalf.   6. Skin/Wound Care: Routine pressure  relief measures.   12/17- Pulled CVL since appeared like getting infected 7. Fluids/Electrolytes/Nutrition: Monitor I&O and weight. Follow up labs CBC/CMP               -GERD: Omeprazole  40 mg bid    8. 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    12/18- changed to PO to get rid of CVL- also could be cause of leukocytosis?  12/19- WBC  doing better 9.ESRD: Continue nightly PD per nephrology.              -pt was independent with management of her PD at home  -12/14, discussed Renvela  with nephrology, they plan to resume  12/16- K+ 3.2- per renal-   12/18-12/19-  K+ still  3.7 10.Hx of papillary thyroid  cancer s/p thyroidectomy: TSH normal, continue Synthroid .    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    12. HFrEF, without acute exacerbation: Nephrology to manage volume status. Cardiology following. Continue heart healthy diet and GDMT as tolerated. Daily weights-appears stable  12/18- Weight 77.7 kg- doing well  12/19- WBC 78.7 kg- overall stable Filed Weights   10/03/24 0447 10/04/24 0548 10/06/24 0532  Weight: 77.7 kg 78.7 kg 77.5 kg      13. Low back pain/Abdominal pain: present since aortic dissection 11/24. Continue to monitor. MRI T and L spine negative, RUQ US  negative for acute findings.              -continue Lidocaine  patch              -pt is able to work through discomfort  12/16- Been having abd pain/back pain for 2 weeks- CT didn't show any acute pathology- did show Cirrhosis, cholelithiasis and B/L renal stones.  14. HTN: Monitor BP per protocol, continue Metoprolol         10/06/2024    8:06 AM 10/06/2024    5:32 AM 10/06/2024    4:46 AM  Vitals with BMI  Weight  170 lbs 14 oz   BMI  28.43   Systolic 125  112  Diastolic 70  71  Pulse 91  97    15. HLD: continue Crestor  10 mg    16. HIV: CD4 445 on 10/15. Continue home [name]  and  Descovy               12/16- will call ID about rising WBC- esp in light of HIV  12/17- d/w ID- they feel HIV is not contributing to issues  17. T2DM: Monitor glucose ac/hs with SSI.              -associated peripheral neuropathy  Highest CBG 108- will d/c HS correction scale   -CBG (last 3)  Recent Labs    10/05/24 2151 10/06/24 0638 10/06/24 1127  GLUCAP 114* 124* 122*   18. Chronic nausea/poor appetite  -12/13 Ate 50%, continue monitor intake  -Continue Remaron  12/17- Still poor appetite- but taking supplements when needed  12/19- eating a little better per pt  19. Diarrhea  -12/13 LBM recorded yesterday type 4, medium, continue to monitor   -pt interested in trying probiotics, will start  12/16- Denies diarrhea- LBM yesterday  12/17- Pt has had 2 formed and 2 mushy stools in last 5 days- LBM 2 days ago- on enteric precautions since was found to have C diff (-) in 11/25- pending C diff when pt has BM  12/18- off C diff enteric precautions- No Sx's of C diff- also LBM formed yesterday evening  12/19- Diarrhea resolved- LBM 2 days ago  20. Leukocytosis  12/15- WBC 15.3 up from 13k -will check if voids at all- and order U/A and Cx if so and check CXR- will also order labs for AM- and  might need to remove CVL in R neck-   12/16- got Blood C'x- so far, nothing back yet- WBC up somewhat to 15.8-  from 15.3- will call ID- is anuric, so don't have source there and CXR was (-) for acute issues. - spoke to ID- they don't feel it's worrisome, but keep an eye on Cx's- and manage if clinically gets worse. 12/17- Pulled CVL on R neck- wasn't looking well- Cultures so far 2 days (-)  12/18- Cultures day 3 (-) and peritoneal fluid has no WBCs or organisms so far- done 3am today 12/19- Cx pending on peritoneal fluids, but no WBCs or organisms- WBC down to 14.8, so trending down- happy to see this- again, asymptomatic -con't to monitor Cx reviewed and appears wnl. WBC reviewed and is  downtrendingg  21. Acute on chronic anemia  12/15- Due to ESRD- but also dropped almost 1 unit since Saturday- which is 1 unit lower than average last 1 week. Will check CT of abd/pelvis and hemoccult- also reached out to Renal about this.   12/16- CT was (-) for acute pathology that would explain- Hemoccult (+)- stopped Heparin  SQ- and GI called- will check into this.  Also Hb up somewhat to 8.1 today  12/17- GI wants C diff checked- with PCR- could be colitis? Don't feel colonoscopy will add much they will consider colonoscopy if Hb continues to drift down with no other source  12/18- Hb 7.8 today- has dropped a little- hemoccult (+) x2- but also has hemorrhoids it appears-will con't to check daily and make sure doesn't drop more.    12/19- HB 7.7- stable for 2 days- will con't to monitor  12/21: Hgb reviewed and is improved  22) Metallic taste in her mouth: requested pharmacy to please review her medications to see if any have metallic taste as a side effect- melatonin, buspar , and protonix  were identified, requested nursing to please discuss with patient if she would like any of these stopped and she would like melatonin d/ced  23) Decreased appetite: consulted nephrology regarding whether IVF can be started and they would like to assess patient first and she complained of puffiness yesterday   9 days A FACE TO FACE EVALUATION WAS PERFORMED  Sven SQUIBB Harshita Bernales 10/06/2024, 12:24 PM     "

## 2024-10-06 NOTE — Progress Notes (Signed)
 Occupational Therapy Session Note  Patient Details  Name: [name] MRN: [id number] Date of Birth: [birth year]  Today's Date: 10/06/2024 OT Individual Time: 0230-0330 OT Individual Time Calculation (min): 60 min    Short Term Goals: Week 1:  OT Short Term Goal 1 (Week 1): The pt will safely complete LB bathing and dressing with Min/ ModI using AE as needed. OT Short Term Goal 1 - Progress (Week 1): Met OT Short Term Goal 2 (Week 1): The pt will safely complete toileting with Min/ ModI using AE as needed. OT Short Term Goal 2 - Progress (Week 1): Met OT Short Term Goal 3 (Week 1): The pt will complete safely complete tub/shower transfers using AE with Min/ ModI after demonstration and initial cues. OT Short Term Goal 3 - Progress (Week 1): Met OT Short Term Goal 4 (Week 1): The pt will safely complete  OT therapy 85% of the time with not more than 3 rest breaks. OT Short Term Goal 4 - Progress (Week 1): Met OT Short Term Goal 5 (Week 1): The pt will safely ambulate for function using a  RW with closeS and additional time. OT Short Term Goal 5 - Progress (Week 1): Met  Skilled Therapeutic Interventions/Progress Updates:   Patient resting in bed at the time of arrival speaking with family over the phone. The pt indicated that she rested well during the night with no pain to report at the time of treatment. The pt was in agreement with completing UB exercise for safe transition to home.   Functional Transfers: The pt was able to transfer from supine in bed to EOB with closeS, she was able to transfer from EOB to w/c LOF with closeS using the RW for additional balance.   UB Exercises: The pt began the session with UB exercises using the 2lb dowel for 2 set of 10 for shld flexion, horizontal abduction, shld rotation, and lg circle with rest breaks as needed, the pt required 2 rest breaks. The pt went on to complete the UB cycle while seated at w/c LOF with rest breaks as needed, the  pt required 2 rest breaks. The pt complete the session  with  UB exercises using a 2lb dumb bell , 2 sets of 10 for bicep curls, elbow extension, and lifts with rest breaks as needed, the pt required 2 rest breaks.The pt was able to transfer from w/c LOF to EOB using the RW with CGA, she was able to transfer from EOB to supine with closeS.  Prior to me exiting the room, the call light and bedside table was  placed within reach with all additional needs addressed.    Therapy Documentation Precautions:  Precautions Precautions: Fall Recall of Precautions/Restrictions: Intact Precaution/Restrictions Comments: PD catheter at mid lower abdomen, triple lumen central line in R jugular, on ART for HIV(+) Restrictions Weight Bearing Restrictions Per Provider Order: No   Therapy/Group: Individual Therapy  [name] 10/06/2024, 4:14 PM

## 2024-10-06 NOTE — Progress Notes (Signed)
 " [facility] Progress Note   Subjective:    Seen and examined patient at bedside. Tolerated PD overnight and noted net UF 1.3L. She reports having a low appetite. Noted a SLP was ordered because she's having issues with swallowing. Blood pressures are stable. Plan for PD tonight.  Objective Vitals:   10/05/24 2159 10/06/24 0446 10/06/24 0532 10/06/24 0806  BP:  112/71  125/70  Pulse:  97  91  Resp:  17    Temp:  97.8 F (36.6 C)    TempSrc:  Oral    SpO2: 100% 100%    Weight:   77.5 kg   Height:       Physical Exam General: Well appearing, NAD. Room air Heart: RRR Lungs: CTAB Abdomen: mild TTP, no guarding Extremities: no LE edema Dialysis Access: PD cath in mid L ABD  Filed Weights   10/03/24 0447 10/04/24 0548 10/06/24 0532  Weight: 77.7 kg 78.7 kg 77.5 kg    Intake/Output Summary (Last 24 hours) at 10/06/2024 1346 Last data filed at 10/06/2024 1025 Gross per 24 hour  Intake 618 ml  Output 1306 ml  Net -688 ml    Additional Objective Labs: Basic Metabolic Panel: Recent Labs  Lab 10/01/24 0428 10/02/24 0949 10/03/24 0507 10/04/24 0606  NA 137  --  136 136  K 3.2*  --  3.7 3.7  CL 94*  --  94* 94*  CO2 24  --  25 23  GLUCOSE 93  --  94 96  BUN 58*  --  57* 55*  CREATININE 12.31*  --  11.70* 11.70*  CALCIUM  8.5*  --  8.8* 8.7*  PHOS 7.5* 6.8*  --  5.9*   Liver Function Tests: Recent Labs  Lab 10/01/24 0428 10/04/24 0606  AST 28  --   ALT 27  --   ALKPHOS 317*  --   BILITOT 1.3*  --   PROT 6.1*  --   ALBUMIN  2.8* 3.1*   No results for input(s): LIPASE, AMYLASE in the last 168 hours. CBC: Recent Labs  Lab 10/01/24 0428 10/02/24 0949 10/03/24 0507 10/04/24 0606 10/05/24 0535 10/06/24 0645  WBC 15.8* 17.6* 17.5* 14.8* 14.6* 12.2*  NEUTROABS 13.3*  --   --   --   --   --   HGB 8.1* 8.2* 7.8* 7.7* 9.0* 9.5*  HCT 23.8* 24.1* 22.7* 22.5* 26.3* 28.5*  MCV 108.2* 107.6* 106.1* 106.6* 108.7* 109.2*  PLT 244 212 225 235 274  223   Blood Culture    Component Value Date/Time   SDES PERITONEAL 10/03/2024 0352   SPECREQUEST FLUID 10/03/2024 0352   CULT  10/03/2024 0352    NO GROWTH 3 DAYS Performed at [facility], [address], [place], [place] [address]    REPTSTATUS 10/06/2024 FINAL 10/03/2024 0352    Cardiac Enzymes: No results for input(s): CKTOTAL, CKMB, CKMBINDEX, TROPONINI in the last 168 hours. CBG: Recent Labs  Lab 10/05/24 1130 10/05/24 1634 10/05/24 2151 10/06/24 0638 10/06/24 1127  GLUCAP 91 109* 114* 124* 122*   Iron  Studies: No results for input(s): IRON , TIBC, TRANSFERRIN, FERRITIN in the last 72 hours. Lab Results  Component Value Date   INR 1.1 09/23/2024   INR 1.4 (H) 08/29/2023   INR 1.4 (H) 08/28/2023   Studies/Results: No results found.  Medications:  dialysis solution 1.5% low-MG/low-CA     dialysis solution 2.5% low-MG/low-CA      allopurinol   100 mg Oral Daily   aspirin   81 mg Oral Daily   budesonide  (PULMICORT ) nebulizer solution  0.25 mg Nebulization BID   busPIRone   10 mg Oral BID   calcitRIOL   0.25 mcg Oral Once per day on Monday Wednesday Friday   darbepoetin (ARANESP ) injection - DIALYSIS  100 mcg Subcutaneous Q Mon-1800   dolutegravir   50 mg Oral Daily   emtricitabine -tenofovir  AF  1 tablet Oral Daily   famotidine   10 mg Oral Daily   feeding supplement  237 mL Oral BID BM   fludrocortisone   0.1 mg Oral Daily   gentamicin  cream  1 Application Topical Daily   hydrocortisone   15 mg Oral BID   insulin  aspart  0-6 Units Subcutaneous TID WC   levothyroxine   150 mcg Oral Q0600   lidocaine   1 patch Transdermal Q24H   loratadine   10 mg Oral QHS   metoprolol  succinate  12.5 mg Oral Daily   mirtazapine   7.5 mg Oral QHS   multivitamin  1 tablet Oral QHS   nystatin   5 mL Oral QID   pantoprazole   40 mg Oral Daily   rosuvastatin   10 mg Oral Daily   saccharomyces boulardii  250 mg Oral Daily   sevelamer  carbonate  1,600 mg Oral TID WC     Dialysis Orders: [facility] - [place] 7d/week, 5 exchanges, fill vol , 1.5hr dwell time, EDW 74.5kg - Typically 1/2 1.5%, 1/2 2.5% dextrose   Assessment/Plan: Debility: In CIR, rehab ongoing. ESRD: Continue PD nightly. Volume has improved, will use 1/2 1.5% bags and 1/2 2.5% bags. 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 Leukocytosis: Likely due to steroids, PD fluid cell count normal - no peritonitis. HTN/volume: BP stable, no edema - continue same meds. Anemia of ESRD: Aranesp  dose raised, Hgb now 9.5 FOBT positive. GI consulted, no plan for scope yet. Secondary HPTH: CorrCa ok, Phos high - continue binders + VDRA. Nutrition: Alb low, on supps. HIV Hypothyroidism  Felicia Piety, NP [facility] 10/06/2024,1:46 PM  LOS: 9 days    "

## 2024-10-07 ENCOUNTER — Other Ambulatory Visit (HOSPITAL_COMMUNITY): Payer: Self-pay

## 2024-10-07 DIAGNOSIS — D72829 Elevated white blood cell count, unspecified: Secondary | ICD-10-CM

## 2024-10-07 LAB — GLUCOSE, CAPILLARY
Glucose-Capillary: 323 mg/dL — ABNORMAL HIGH (ref 70–99)
Glucose-Capillary: 91 mg/dL (ref 70–99)
Glucose-Capillary: 104 mg/dL — ABNORMAL HIGH (ref 70–99)
Glucose-Capillary: 86 mg/dL (ref 70–99)

## 2024-10-07 LAB — BASIC METABOLIC PANEL WITH GFR
Anion gap: 19 — ABNORMAL HIGH (ref 5–15)
BUN: 55 mg/dL — ABNORMAL HIGH (ref 6–20)
CO2: 23 mmol/L (ref 22–32)
Calcium: 8.8 mg/dL — ABNORMAL LOW (ref 8.9–10.3)
Chloride: 95 mmol/L — ABNORMAL LOW (ref 98–111)
Creatinine, Ser: 11.1 mg/dL — ABNORMAL HIGH (ref 0.44–1.00)
GFR, Estimated: 4 mL/min — ABNORMAL LOW
Glucose, Bld: 133 mg/dL — ABNORMAL HIGH (ref 70–99)
Potassium: 4.2 mmol/L (ref 3.5–5.1)
Sodium: 136 mmol/L (ref 135–145)

## 2024-10-07 LAB — OCCULT BLOOD X 1 CARD TO LAB, STOOL: Fecal Occult Bld: NEGATIVE

## 2024-10-07 MED ORDER — SEVELAMER CARBONATE 800 MG PO TABS: 1600.0000 mg | ORAL_TABLET | Freq: Three times a day (TID) | ORAL | 0 refills | Status: AC

## 2024-10-07 MED ORDER — FLUDROCORTISONE ACETATE 0.1 MG PO TABS
0.1000 mg | ORAL_TABLET | Freq: Every day | ORAL | 0 refills | Status: AC
  Filled 2024-10-07: qty 30, 30d supply, fill #0

## 2024-10-07 MED ORDER — HEPARIN SODIUM (PORCINE) 1000 UNIT/ML IJ SOLN
INTRAPERITONEAL | Status: DC | PRN
  Filled 2024-10-07: qty 6000

## 2024-10-07 MED ORDER — ACETAMINOPHEN 325 MG PO TABS: 325.0000 mg | ORAL_TABLET | ORAL | Status: AC | PRN

## 2024-10-07 MED ORDER — RENA-VITE PO TABS
1.0000 | ORAL_TABLET | Freq: Every day | ORAL | 0 refills | Status: AC
  Filled 2024-10-07: qty 100, 100d supply, fill #0

## 2024-10-07 MED ORDER — HEPARIN SODIUM (PORCINE) 1000 UNIT/ML IJ SOLN
INTRAPERITONEAL | Status: DC | PRN
  Filled 2024-10-07 (×2): qty 6000

## 2024-10-07 MED ORDER — OXYCODONE HCL 5 MG PO TABS
5.0000 mg | ORAL_TABLET | Freq: Four times a day (QID) | ORAL | 0 refills | Status: AC | PRN
  Filled 2024-10-07: qty 28, 7d supply, fill #0

## 2024-10-07 MED ORDER — METOPROLOL SUCCINATE ER 25 MG PO TB24
12.5000 mg | ORAL_TABLET | Freq: Every day | ORAL | 0 refills | Status: DC
  Filled 2024-10-07: qty 15, 30d supply, fill #0

## 2024-10-07 MED ORDER — TRAZODONE HCL 50 MG PO TABS
25.0000 mg | ORAL_TABLET | Freq: Every evening | ORAL | 0 refills | Status: AC | PRN
  Filled 2024-10-07: qty 15, 30d supply, fill #0

## 2024-10-07 MED ORDER — MIRTAZAPINE 7.5 MG PO TABS
7.5000 mg | ORAL_TABLET | Freq: Every day | ORAL | 0 refills | Status: DC
  Filled 2024-10-07: qty 30, 30d supply, fill #0

## 2024-10-07 MED ORDER — ENSURE PLUS HIGH PROTEIN PO LIQD: 237.0000 mL | Freq: Two times a day (BID) | ORAL | Status: AC

## 2024-10-07 MED ORDER — DARBEPOETIN ALFA 100 MCG/0.5ML IJ SOSY: 100.0000 ug | PREFILLED_SYRINGE | INTRAMUSCULAR | Status: DC

## 2024-10-07 MED ORDER — ASPIRIN 81 MG PO CHEW
81.0000 mg | CHEWABLE_TABLET | Freq: Every day | ORAL | 0 refills | Status: AC
  Filled 2024-10-07: qty 30, 30d supply, fill #0

## 2024-10-07 MED ORDER — BUDESONIDE 0.25 MG/2ML IN SUSP
0.2500 mg | Freq: Two times a day (BID) | RESPIRATORY_TRACT | 12 refills | Status: DC
  Filled 2024-10-07: qty 60, 15d supply, fill #0

## 2024-10-07 MED ORDER — BUSPIRONE HCL 5 MG PO TABS: 10.0000 mg | ORAL_TABLET | Freq: Two times a day (BID) | ORAL | Status: AC

## 2024-10-07 MED ORDER — HYDROCORTISONE 5 MG PO TABS
15.0000 mg | ORAL_TABLET | Freq: Two times a day (BID) | ORAL | 0 refills | Status: AC
  Filled 2024-10-07: qty 180, 30d supply, fill #0

## 2024-10-07 MED ORDER — SACCHAROMYCES BOULARDII 250 MG PO CAPS
250.0000 mg | ORAL_CAPSULE | Freq: Every day | ORAL | 0 refills | Status: AC
  Filled 2024-10-07: qty 30, 30d supply, fill #0

## 2024-10-07 MED ORDER — FAMOTIDINE 10 MG PO TABS
10.0000 mg | ORAL_TABLET | Freq: Every day | ORAL | 0 refills | Status: AC
  Filled 2024-10-07: qty 30, 30d supply, fill #0

## 2024-10-07 NOTE — Progress Notes (Signed)
 Occupational Therapy Session Note  Patient Details  Name: [name] MRN: [id number] Date of Birth: 08-Aug-1966  Today's Date: 10/07/2024 OT Individual Time: 9054-8957 OT Individual Time Calculation (min): 57 min    Short Term Goals:  Week 2:  OT Short Term Goal 1 (Week 2): LTG=STG d/t ELOS  Skilled Therapeutic Interventions/Progress Updates: Patient received sitting on the EOB. Agreeable to OT treatment assessing ADL levels in preparation for discharge. See functional levels below. Patient needing frequents rest breaks, but did well pacing herself and able to relay modifications already made at home to deal with poor energy levels following HD. Patient displayed good safety while gathering own toiletries and clothing.       Therapy Documentation Precautions:  Precautions Precautions: Fall Recall of Precautions/Restrictions: Intact Precaution/Restrictions Comments: PD catheter at mid lower abdomen, triple lumen central line in R jugular, on ART for HIV(+) Restrictions Weight Bearing Restrictions Per Provider Order: No General:   Vital Signs: Oxygen  Therapy SpO2: 97 % O2 Device: Room Air Pain:   ADL: ADL Equipment Provided:  (RW, wc) Eating: Independent Where Assessed-Eating: Edge of bed, Bed level Grooming: Modified Independent Where Assessed-Grooming: Sitting at sink Upper Body Bathing: Modified Independent  Where Assessed-Upper Body Bathing: Sitting at sink Lower Body Bathing:  Modified Independent Where Assessed-Lower Body Bathing: Sitting at sink Upper Body Dressing: Independent  Where Assessed-Upper Body Dressing: Sitting at sink Lower Body Dressing: Modified Independent Where Assessed-Lower Body Dressing: Sitting at sink Toileting: Setup, Minimal assistance Where Assessed-Toileting: Educational Psychologist Method: Surveyor, Minerals: Raised Scientist, Research (physical Sciences): Stand by  Ship Broker Method: Engineer, Technical Sales: Information systems manager with back Film/video Editor:  Cytogeneticist Method: Warden/ranger: Shower seat with back    Therapy/Group: Individual Therapy  [name] 10/07/2024, 12:06 PM

## 2024-10-07 NOTE — Progress Notes (Signed)
 Inpatient Rehabilitation Care Coordinator Discharge Note   Patient Details  Name: [name] MRN: [id number] Date of Birth: 06/30/1966   Discharge location: HOME ALONE WITH INTERMITTENT FAMILY COMING IN AND ASSISTING AS THEY DID  PRIOR TO ADMISSION  Length of Stay: 11 DAYS  Discharge activity level: MOD/I LEVEL  Home/community participation: HOMEBOUND  Patient response un:Yzjouy Literacy - How often do you need to have someone help you when you read instructions, pamphlets, or other written material from your doctor or pharmacy?: Never  Patient response un:Dnrpjo Isolation - How often do you feel lonely or isolated from those around you?: Never  Services provided included: MD, RD, PT, OT, RN, CM, TR, Pharmacy, Neuropsych, SW  Financial Services:  Field Seismologist Utilized: Scientific Laboratory Technician MEDICARE  Choices offered to/list presented to: PT  Follow-up services arranged:  Home Health, Patient/Family request agency HH/DME Home Health Agency: [facility]  PT  OT  RN   NEEDS TUB BENCH WILL GET ON OWN DUE TO PRIVATE PAY   HH/DME Requested Agency: [facility]  Patient response to transportation need: Is the patient able to respond to transportation needs?: Yes In the past 12 months, has lack of transportation kept you from medical appointments or from getting medications?: No In the past 12 months, has lack of transportation kept you from meetings, work, or from getting things needed for daily living?: No   Patient/Family verbalized understanding of follow-up arrangements:  Yes  Individual responsible for coordination of the follow-up plan: SELF [phone]  Confirmed correct DME delivered: Raymonde Asberry MATSU 10/07/2024    Comments (or additional information):PT DID WELL AND PROGRESSED QUICKLY. READY TO GO HOME  Summary of Stay    Date/Time Discharge Planning CSW  10/01/24 0908 Home with family intermittently coming in to assist-was prior to  admission. Having medical issues-will await team recommendations. Pt required assist prior to admission due to decline in function prior to admission RGD       Kyandre Okray G

## 2024-10-07 NOTE — Progress Notes (Signed)
 Physical Therapy Session Note  Patient Details  Name: [name] MRN: [id number] Date of Birth: 1965-10-28  Today's Date: 10/07/2024 PT Individual Time: 8582-8554 PT Individual Time Calculation (min): 28 min   Short Term Goals: Week 1:  PT Short Term Goal 1 (Week 1): Pt will perform all standing transfers with SBA. PT Short Term Goal 2 (Week 1): Pt will ambulate at least 60 ft per bout using LRAD with overall SBA demonstrating improved activity tolerance. PT Short Term Goal 3 (Week 1): Pt will initiate stair training. PT Short Term Goal 4 (Week 1): Pt will perform appropriate outcome measure.  Skilled Therapeutic Interventions/Progress Updates:     Pt received seated in [facility] and agrees to therapy. No complaint of pain but does report significant fatigue. Pt completes packing activities in room with PT providing close supervision and facilitation of positioning of luggage to promote optimal body mechanics. Pt performs sit to stand and ambulates x15' with RW, then transitions to ambulating without RW to transfer to chair in small space to continue packing clothing. Stand step transfer back to [facility] with RW at mod(I). WC transport to nursing station to procure holiday beverage for therapeutic activity. WC transport back to room. Left seated with all needs within reach.  Therapy Documentation Precautions:  Precautions Precautions: Fall Recall of Precautions/Restrictions: Intact Precaution/Restrictions Comments: PD catheter at mid lower abdomen, triple lumen central line in R jugular, on ART for HIV(+) Restrictions Weight Bearing Restrictions Per Provider Order: No   Therapy/Group: Individual Therapy  Elsie JAYSON Dawn, PT, DPT 10/07/2024, 3:52 PM

## 2024-10-07 NOTE — Progress Notes (Signed)
 Patient ID: [name], female   DOB: Jun 03, 1966, 58 y.o.   MRN: [id number] Met with pt and then went back due to she had more questions. Pt feels she needs assist at home offered to add an aide to the home health order but explained her insurance does not cover an aide for a number of hours. She will talk with sister who is in catering manager of [place]. Pt is mod/I in room and doing well but is not feeling well today. Pt is anxious regarding going home tomorrow she does have her friend coming to stay with for 6 days for the transition home.

## 2024-10-07 NOTE — Plan of Care (Signed)
  Problem: RH Balance Goal: LTG Patient will maintain dynamic standing balance (PT) Description: LTG:  Patient will maintain dynamic standing balance with assistance during mobility activities (PT) Outcome: Completed/Met   Problem: Sit to Stand Goal: LTG:  Patient will perform sit to stand with assistance level (PT) Description: LTG:  Patient will perform sit to stand with assistance level (PT) Outcome: Completed/Met   Problem: RH Bed Mobility Goal: LTG Patient will perform bed mobility with assist (PT) Description: LTG: Patient will perform bed mobility with assistance, with/without cues (PT). Outcome: Completed/Met   Problem: RH Bed to Chair Transfers Goal: LTG Patient will perform bed/chair transfers w/assist (PT) Description: LTG: Patient will perform bed to chair transfers with assistance (PT). Outcome: Completed/Met   Problem: RH Car Transfers Goal: LTG Patient will perform car transfers with assist (PT) Description: LTG: Patient will perform car transfers with assistance (PT). Outcome: Completed/Met   Problem: RH Ambulation Goal: LTG Patient will ambulate in controlled environment (PT) Description: LTG: Patient will ambulate in a controlled environment, # of feet with assistance (PT). Outcome: Completed/Met Goal: LTG Patient will ambulate in home environment (PT) Description: LTG: Patient will ambulate in home environment, # of feet with assistance (PT). Outcome: Completed/Met   Problem: RH Stairs Goal: LTG Patient will ambulate up and down stairs w/assist (PT) Description: LTG: Patient will ambulate up and down # of stairs with assistance (PT) Outcome: Completed/Met   

## 2024-10-07 NOTE — Progress Notes (Signed)
 Noted plans for pt to d/c to home tomorrow. Contacted [facility] and spoke to home therapy RN. Case discussed with renal PA and PA will contact home therapy RN to provide info/orders as needed. Will assist as needed.   Randine Mungo Dialysis Navigator [phone]

## 2024-10-07 NOTE — Progress Notes (Signed)
 " [facility] Progress Note   Subjective:   Seen in room. S/p PD overnight, net UF . She reports frequent alarming. Is having regular BM now. No CP/dyspnea.  Objective Vitals:   10/06/24 2050 10/07/24 0500 10/07/24 0540 10/07/24 0744  BP:   121/66   Pulse:   86   Resp:      Temp:   98.2 F (36.8 C)   TempSrc:      SpO2: 96%  100% 97%  Weight:  76.6 kg    Height:       Physical Exam General: Well appearing, NAD. Room air Heart: RRR Lungs: CTAB Abdomen: soft, non-tender. PD cath in L abd Extremities: no LE edema Dialysis Access:  PD cath in L mid abd  Additional Objective Labs: Basic Metabolic Panel: Recent Labs  Lab 10/01/24 0428 10/02/24 0949 10/03/24 0507 10/04/24 0606 10/07/24 0533  NA 137  --  136 136 136  K 3.2*  --  3.7 3.7 4.2  CL 94*  --  94* 94* 95*  CO2 24  --  25 23 23   GLUCOSE 93  --  94 96 133*  BUN 58*  --  57* 55* 55*  CREATININE 12.31*  --  11.70* 11.70* 11.10*  CALCIUM  8.5*  --  8.8* 8.7* 8.8*  PHOS 7.5* 6.8*  --  5.9*  --    Liver Function Tests: Recent Labs  Lab 10/01/24 0428 10/04/24 0606  AST 28  --   ALT 27  --   ALKPHOS 317*  --   BILITOT 1.3*  --   PROT 6.1*  --   ALBUMIN  2.8* 3.1*   CBC: Recent Labs  Lab 10/01/24 0428 10/02/24 0949 10/03/24 0507 10/04/24 0606 10/05/24 0535 10/06/24 0645  WBC 15.8* 17.6* 17.5* 14.8* 14.6* 12.2*  NEUTROABS 13.3*  --   --   --   --   --   HGB 8.1* 8.2* 7.8* 7.7* 9.0* 9.5*  HCT 23.8* 24.1* 22.7* 22.5* 26.3* 28.5*  MCV 108.2* 107.6* 106.1* 106.6* 108.7* 109.2*  PLT 244 212 225 235 274 223   Medications:  dialysis solution 1.5% low-MG/low-CA     dialysis solution 2.5% low-MG/low-CA      allopurinol   100 mg Oral Daily   aspirin   81 mg Oral Daily   budesonide  (PULMICORT ) nebulizer solution  0.25 mg Nebulization BID   busPIRone   10 mg Oral BID   calcitRIOL   0.25 mcg Oral Once per day on Monday Wednesday Friday   darbepoetin (ARANESP ) injection - DIALYSIS  100 mcg  Subcutaneous Q Mon-1800   dolutegravir   50 mg Oral Daily   emtricitabine -tenofovir  AF  1 tablet Oral Daily   famotidine   10 mg Oral Daily   feeding supplement  237 mL Oral BID BM   fludrocortisone   0.1 mg Oral Daily   gentamicin  cream  1 Application Topical Daily   hydrocortisone   15 mg Oral BID   insulin  aspart  0-6 Units Subcutaneous TID WC   levothyroxine   150 mcg Oral Q0600   lidocaine   1 patch Transdermal Q24H   loratadine   10 mg Oral QHS   metoprolol  succinate  12.5 mg Oral Daily   mirtazapine   7.5 mg Oral QHS   multivitamin  1 tablet Oral QHS   nystatin   5 mL Oral QID   pantoprazole   40 mg Oral Daily   rosuvastatin   10 mg Oral Daily   saccharomyces boulardii  250 mg Oral Daily   sevelamer  carbonate  1,600 mg  Oral TID WC   Dialysis Orders [facility] - [place] 7d/week, 5 exchanges, fill vol , 1.5hr dwell time, EDW 74.5kg - Typically 1/2 1.5%, 1/2 2.5% dextrose    Assessment/Plan: Debility: In CIR, rehab ongoing. ESRD: Continue PD nightly. Volume good, continue current regimen. Pt reports frequent alarming overnight, d/w RN - some fibrin present, will add heparin  to PD fluids tonight.  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 Leukocytosis: Likely due to steroids, PD fluid cell count normal - no peritonitis. HTN/volume: BP stable, no edema - continue same meds. Anemia of ESRD: Hgb improving, continue Aranesp  q Monday. FOBT positive. GI consulted, no plan for scope yet. Secondary HPTH: CorrCa ok, Phos high but improving - continue binders + VDRA. Nutrition: Alb low, on supps. HIV Hypothyroidism   Izetta Boehringer, DEVONNA 10/07/2024, 9:02 AM  [facility]    "

## 2024-10-07 NOTE — Progress Notes (Signed)
 Occupational Therapy Discharge Summary  Patient Details  Name: [name] MRN: [id number] Date of Birth: Feb 04, 1966  Date of Discharge from OT service:October 07, 2024  Today's Date: 10/07/2024 OT Individual Time: 9054-8957 OT Individual Time Calculation (min): 57 min    Patient has met 9 of 9 long term goals due to improved activity tolerance.  Patient to discharge at overall Modified Independent level.  Patient's care partner is independent to provide the necessary physical assistance at discharge.     Recommendation:  Patient will benefit from ongoing skilled OT services in home health setting to continue to advance functional skills in the area of BADL and iADL.  Equipment: No equipment provided Patient's family has tub transfer bench.  Reasons for discharge: treatment goals met and discharge from hospital  Patient/family agrees with progress made and goals achieved: Yes  OT Discharge Precautions/Restrictions  Precautions Precautions: Fall Recall of Precautions/Restrictions: Intact Precaution/Restrictions Comments: PD catheter at mid lower abdomen, triple lumen central line in R jugular, on ART for HIV(+) Restrictions Weight Bearing Restrictions Per Provider Order: No General   Vital Signs   Pain Pain Assessment Pain Scale: 0-10 Pain Score: 8  Pain Location: Back Pain Intervention(s): Medication (See eMAR) ADL ADL Equipment Provided:  (RW, wc) Eating: Independent Where Assessed-Eating: Edge of bed, Bed level Grooming: Modified independent Where Assessed-Grooming: Sitting at sink Upper Body Bathing: Modified independent Where Assessed-Upper Body Bathing: Sitting at sink Lower Body Bathing: Modified independent Where Assessed-Lower Body Bathing: Sitting at sink Upper Body Dressing: Independent Where Assessed-Upper Body Dressing: Chair Lower Body Dressing: Modified independent Where Assessed-Lower Body Dressing: Chair Toileting: Modified  independent Where Assessed-Toileting: Teacher, Adult Education: Engineer, Agricultural Method: Proofreader: Engineer, Technical Sales: Close supervison Web Designer Method: Ship Broker: Information systems manager with back Film/video Editor: Distant supervision Film/video Editor Method: Designer, Industrial/product: Information systems manager with back ADL Comments: needs increased time due to fatigue/endurance Vision Baseline Vision/History: 1 Wears glasses Patient Visual Report: No change from baseline Vision Assessment?: No apparent visual deficits Perception  Perception: Within Functional Limits Praxis Praxis: WFL Cognition Cognition Overall Cognitive Status: Within Functional Limits for tasks assessed Arousal/Alertness: Awake/alert Orientation Level: Person;Place;Situation Person: Oriented Place: Oriented Situation: Oriented Memory: Appears intact Awareness: Appears intact Problem Solving: Appears intact Safety/Judgment: Appears intact Brief Interview for Mental Status (BIMS) Repetition of Three Words (First Attempt): 3 Temporal Orientation: Year: Correct Temporal Orientation: Month: Accurate within 5 days Temporal Orientation: Day: Correct Recall: Sock: Yes, no cue required Recall: Blue: Yes, no cue required Recall: Bed: Yes, no cue required BIMS Summary Score: 15 Sensation Sensation Light Touch: Appears Intact Hot/Cold: Appears Intact Proprioception: Appears Intact Stereognosis: Appears Intact Coordination Gross Motor Movements are Fluid and Coordinated: No Fine Motor Movements are Fluid and Coordinated: No Coordination and Movement Description: movement and balance affected by decreased strength and endurance deficits however greatly improved since eval, 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 Motor  Motor Motor: Within Functional Limits Motor - Skilled Clinical Observations: movement and  balance mainly affected by decreased strength and endurance deficits however greatly improvd since eval Mobility  Bed Mobility Bed Mobility: Rolling Right;Rolling Left;Supine to Sit;Sit to Supine Rolling Right: Independent with assistive device Rolling Left: Independent with assistive device Supine to Sit: Independent with assistive device Sit to Supine: Independent with assistive device Transfers Sit to Stand: Independent with assistive device Stand to Sit: Independent with assistive device  Trunk/Postural Assessment  Cervical Assessment Cervical Assessment: Within Functional Limits  Thoracic Assessment Thoracic Assessment: Within Functional Limits Lumbar Assessment Lumbar Assessment: Within Functional Limits Postural Control Postural Control: Within Functional Limits  Balance Balance Balance Assessed: Yes Static Sitting Balance Static Sitting - Balance Support: No upper extremity supported Static Sitting - Level of Assistance: 6: Modified independent (Device/Increase time) Dynamic Sitting Balance Dynamic Sitting - Balance Support: Left upper extremity supported;Right upper extremity supported;Feet supported;During functional activity Dynamic Sitting - Level of Assistance: 6: Modified independent (Device/Increase time) Static Standing Balance Static Standing - Balance Support: During functional activity Static Standing - Level of Assistance: 6: Modified independent (Device/Increase time) Dynamic Standing Balance Dynamic Standing - Balance Support: Right upper extremity supported;Left upper extremity supported Dynamic Standing - Level of Assistance: 6: Modified independent (Device/Increase time) Extremity/Trunk Assessment RUE Assessment RUE Assessment: Within Functional Limits LUE Assessment LUE Assessment: Within Functional Limits   [name] 10/07/2024, 12:06 PM

## 2024-10-07 NOTE — Evaluation (Signed)
 Speech Language Pathology Assessment and Discharge Plan  Patient Details  Name: [name] MRN: [id number] Date of Birth: 03-Mar-1966  SLP Diagnosis: Dysphagia  Rehab Potential:   ELOS:     Today's Date: 10/07/2024 SLP Individual Time: 1325-1400 SLP Individual Time Calculation (min): 35 min and Today's Date: 10/07/2024 SLP Missed Time: 25 Minutes Missed Time Reason:  (time with NP)  Hospital Problem: Principal Problem:   Debility Active Problems:   Heme positive stool   Cognitive and neurobehavioral dysfunction  Past Medical History:  Past Medical History:  Diagnosis Date   Acute pain of right shoulder due to trauma 06/08/2023   Acute pancreatitis 10/23/2021   Acute renal failure superimposed on stage 4 chronic kidney disease (HCC) 10/24/2021   Allergy    Anemia    Normocytic   Antibiotic-induced yeast infection 09/28/2020   Anxiety    Asthma    Bronchitis 2005   Bursitis of left shoulder 09/06/2019   CKD (chronic kidney disease)    Dialysis on Mon-Wed- Fri   CLASS 1-EXOPHTHALMOS-THYROTOXIC 02/08/2007   Cognitive dysfunction 02/21/2020   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  Genes Analyzed (43 total): APC*, ATM*, AXIN2, BARD1, BMPR1A, BRCA1*, BRCA2*, BRIP1*, CDH1*, CDK4, CDKN2A, CHEK2*, DICER1, GALNT12, HOXB13, MEN1, MLH1*, MRE11A, MSH2*, MSH3, MSH6*, MUTYH*, NBN, NF1*, NTHL1,  PALB2*, PMS2*, POLD1, POLE, PTEN*, RAD50, RAD51C*, RAD51D*, RET, SDHB, SDHD, SMAD4, SMARCA4, STK11 and TP53* (sequencing and deletion/duplication); EGFR (s   GERD 07/24/2006   GRAVE'S DISEASE 01/01/2008   History of hidradenitis suppurativa    History of kidney stones    History of thrush    HIV DISEASE 07/24/2006   dx March 05   Hyperlipidemia    HYPERTENSION 07/24/2006   Hyperthyroidism 08/2006   Grave's Disease -diffuse radiotracer uptake 08/25/06 Thyroid  scan-Cold nodule to R lower lobe of thyrorid   Ileus (HCC) 02/14/2023   Left bundle branch block (LBBB)    Malnutrition of moderate degree 02/08/2023   Menometrorrhagia    hx of   Nephrolithiasis    Panniculitis 05/12/2020   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 Completed 2 yrs Prednisone  after lung bx confirmation. No symptoms since then.   Sebaceous cyst 04/21/2020   Suppurative hidradenitis    Thyroid  cancer (HCC)    THYROID  NODULE, RIGHT 02/08/2007   Past  Surgical History:  Past Surgical History:  Procedure Laterality Date   AORTIC ARCH DEBRANCHING N/A 08/25/2023   Procedure: AORTIC ARCH DEBRANCHING USING 12X8X8MM HEMASHIELD GRAFT;  Surgeon: Lucas Dorise POUR, MD;  Location: [facility];  Service: Open Heart Surgery;  Laterality: N/A;  with bypasses to the innominate and left common carotid arteries   APPLICATION OF WOUND VAC N/A 01/20/2021   Procedure:  APPLICATION OF WOUND VAC;  Surgeon: Lowery Estefana RAMAN, DO;  Location: [facility];  Service: Plastics;  Laterality: N/A;   BREAST EXCISIONAL BIOPSY Right 04/26/2018   right axilla negative   BREAST EXCISIONAL BIOPSY Left 04/26/2018   left axilla negative   BREAST LUMPECTOMY Right 10/03/2018   malignant   BREAST LUMPECTOMY WITH RADIOACTIVE SEED AND SENTINEL LYMPH NODE BIOPSY Right 10/03/2018   Procedure: RIGHT BREAST LUMPECTOMY WITH RADIOACTIVE SEED AND SENTINEL LYMPH NODE MAPPING;  Surgeon: Vanderbilt Ned, MD;  Location: [facility];  Service: General;  Laterality: Right;   BREAST SURGERY  1997   Breast Reduction    CAPD INSERTION N/A 12/21/2023   Procedure: LAPAROSCOPIC INSERTION CONTINUOUS AMBULATORY PERITONEAL DIALYSIS  (CAPD) CATHETER; LAPAROSCOPIC OMENTUMPEXY;  Surgeon: Magda Ned SAILOR, MD;  Location: [facility];  Service: Vascular;  Laterality: N/A;   CYSTOSCOPY W/ URETERAL STENT REMOVAL  11/09/2012   Procedure: CYSTOSCOPY WITH STENT REMOVAL;  Surgeon: Ricardo Likens, MD;  Location: [facility];  Service: Urology;  Laterality: Right;   CYSTOSCOPY WITH RETROGRADE PYELOGRAM, URETEROSCOPY AND STENT PLACEMENT  11/09/2012   Procedure: CYSTOSCOPY WITH RETROGRADE PYELOGRAM, URETEROSCOPY AND STENT PLACEMENT;  Surgeon: Ricardo Likens, MD;  Location: [facility];  Service: Urology;  Laterality: Left;  LEFT URETEROSCOPY, STONE MANIPULATION, left STENT exchange    CYSTOSCOPY WITH STENT PLACEMENT  10/02/2012   Procedure: CYSTOSCOPY WITH STENT PLACEMENT;  Surgeon: Ricardo Likens, MD;  Location: [facility];  Service: Urology;  Laterality: Left;   DEBRIDEMENT AND CLOSURE WOUND N/A 01/20/2021   Procedure: Excision of abdominal wound with closure;  Surgeon: Lowery Estefana RAMAN, DO;  Location: [facility];  Service: Plastics;  Laterality: N/A;   DILATION AND CURETTAGE OF UTERUS  11/2002   s/p for 1st trimester nonviable pregnancy   ESOPHAGOGASTRODUODENOSCOPY N/A 09/11/2024   Procedure: EGD  (ESOPHAGOGASTRODUODENOSCOPY);  Surgeon: Abran Norleen SAILOR, MD;  Location: [facility];  Service: Gastroenterology;  Laterality: N/A;   EYE SURGERY     sty under eyelid   INCISE AND DRAIN ABCESS  08/2002   s/p I &D for righ inframmary fold hidradenitis   INCISION AND DRAINAGE PERITONSILLAR ABSCESS  12/2001   IR CV LINE INJECTION  06/07/2018   IR FLUORO GUIDE CV LINE RIGHT  01/30/2023   IR IMAGING GUIDED PORT INSERTION  06/20/2018   IR REMOVAL TUN ACCESS W/ PORT W/O FL MOD SED  06/20/2018   IR US  GUIDE VASC ACCESS RIGHT  01/30/2023   IRRIGATION AND DEBRIDEMENT ABSCESS  01/31/2012   Procedure: IRRIGATION AND DEBRIDEMENT ABSCESS;  Surgeon: Alm VEAR Angle, MD;  Location: [facility];  Service: General;  Laterality: Right;  right breast and axilla    LAPAROSCOPIC REPOSITIONING CAPD CATHETER Left 01/10/2024   Procedure: Removal of peritoneal dialysis Catheter.;  Surgeon: Magda Ned SAILOR, MD;  Location: [facility] OR;  Service: Vascular;  Laterality: Left;   LAPAROSCOPY N/A 01/10/2024   Procedure: LAPAROSCOPY, DIAGNOSTIC;  Surgeon: Magda Ned SAILOR, MD;  Location: [facility] OR;  Service: Vascular;  Laterality: N/A;   NEPHROLITHOTOMY  10/02/2012   Procedure: NEPHROLITHOTOMY PERCUTANEOUS;  Surgeon: Ricardo Likens, MD;  Location: [facility];  Service: Urology;  Laterality: Right;  First Stage Percutaneous Nephrolithotomy with Surgeon Access, Left Ureteral Stent     NEPHROLITHOTOMY  10/04/2012   Procedure: NEPHROLITHOTOMY PERCUTANEOUS SECOND LOOK;  Surgeon: Ricardo Likens, MD;  [facility]: [facility];  Service: Urology;  Laterality: Right;      NEPHROLITHOTOMY  10/08/2012   Procedure: NEPHROLITHOTOMY PERCUTANEOUS;  Surgeon: Ricardo Likens, MD;  [facility]: [facility];  Service: Urology;  Laterality: Right;  THIRD STAGE, nephrostomy tube exchange x 2   NEPHROLITHOTOMY  10/11/2012   Procedure: NEPHROLITHOTOMY PERCUTANEOUS SECOND LOOK;  Surgeon: Ricardo Likens, MD;  Location: [facility];  Service: Urology;  Laterality: Right;  RIGHT 4 STAGE  PERCUTANOUS NEPHROLITHOTOMY, right URETEROSCOPY WITH HOLMIUM LASER    PANNICULECTOMY N/A 12/21/2020   Procedure: PANNICULECTOMY;  Surgeon: Lowery Estefana RAMAN, DO;  [facility]: [facility];  Service: Plastics;  Laterality: N/A;   PERCUTANEOUS NEPHROSTOLITHOTOMY  04/2022   PORT-A-CATH REMOVAL N/A 07/16/2020   Procedure: REMOVAL PORT-A-CATH;  Surgeon: Vanderbilt Ned, MD;  Location: [facility];  Service: General;  Laterality: N/A;   PORTACATH PLACEMENT Left 05/17/2018   Procedure: INSERTION PORT-A-CATH;  Surgeon: Vernetta Berg, MD;  Location: [facility];  Service: General;  Laterality: Left;   RADICAL NECK DISSECTION  04/12/2017   limited/notes 04/12/2017   RADICAL NECK DISSECTION N/A 04/12/2017   Procedure: RADICAL NECK DISSECTION;  Surgeon: Carlie Clark, MD;  Location: [facility] OR;  Service: ENT;  Laterality: N/A;  limited neck dissection 2 hours total   REDUCTION MAMMAPLASTY Bilateral 1998   RIGHT/LEFT HEART CATH AND CORONARY ANGIOGRAPHY N/A 03/12/2020   Procedure: RIGHT/LEFT HEART CATH AND CORONARY ANGIOGRAPHY;  Surgeon: Cherrie Toribio SAUNDERS, MD;  [facility];  Service: Cardiovascular;  Laterality: N/A;   RIGHT/LEFT HEART CATH AND CORONARY ANGIOGRAPHY N/A 09/26/2024   Procedure: RIGHT/LEFT HEART CATH AND CORONARY ANGIOGRAPHY;  Surgeon: Darron Deatrice LABOR, MD;  [facility];  Service: Cardiovascular;  Laterality: N/A;   [place]  1994   TEE WITHOUT CARDIOVERSION N/A 08/25/2023   Procedure: TRANSESOPHAGEAL ECHOCARDIOGRAM;  Surgeon: Lucas Dorise POUR, MD;  Location: [facility];  Service: Open Heart Surgery;  Laterality: N/A;   TENCKHOFF CATHETER INSERTION Left 01/10/2024   Procedure: INSERTION, CATHETER, DIALYSIS, PERITONEAL;  Surgeon: Magda Ned SAILOR, MD;  Location: [facility];  Service: Vascular;  Laterality: Left;   THORACIC AORTIC ENDOVASCULAR STENT GRAFT N/A 02/02/2023   Procedure: THORACIC AORTIC ENDOVASCULAR STENT GRAFT;  Surgeon: Lanis Fonda BRAVO, MD;   Location[facility];  Service: Vascular;  Laterality: N/A;   THORACIC AORTIC ENDOVASCULAR STENT GRAFT N/A 08/25/2023   Procedure: THORACIC AORTIC ENDOVASCULAR STENT GRAFT;  Surgeon: Lanis Fonda BRAVO, MD;  Location: [facility] OR;  Service: Vascular;  Laterality: N/A;   THYROIDECTOMY  04/12/2017   completion/notes 04/12/2017   THYROIDECTOMY N/A 04/12/2017   Procedure: THYROIDECTOMY;  Surgeon: Carlie Clark, MD;  Location: [facility] OR;  Service: ENT;  Laterality: N/A;  Completion Thyroidectomy   TOTAL THYROIDECTOMY  2010   ULTRASOUND GUIDANCE FOR VASCULAR ACCESS Right 08/25/2023   Procedure: ULTRASOUND GUIDANCE FOR VASCULAR ACCESS, RIGHT FEMORAL ARTERY;  Surgeon: Lanis Fonda BRAVO, MD;  [facility]: [facility];  Service: Vascular;  Laterality: Right;    Assessment / Plan / Recommendation Clinical Impression  Carrol Hougland is a 58 year old female with PMHx Graves disease, HTN, T2DM, ESRD on PD, HIV, aortic dissection s/p repair 08/2023, recent hx of pituitary apoplexy, papillary thyroid  carcinoma s/p thyroidectomy, sarcoidosis, OSA, and breast cancer previously on  chemotherapy presented to the ER ON 09/23/24 from dialysis with complaints of back pain, abdominal pain and dyspnea. EMS noted BP 90/60 with decreased radial pulses, BP in ER 64/30. She received IVF resuscitation via I/O improving blood pressures. A CTA chest/abd/pelv revealed stable thoracoabdominal aortic aneurysm and dissection with no significant change with no evidence of aortic leak or rupture, persistent pneumoperitoneum and pelvic free fluid in the setting of peritoneal dialysis catheter. The patient was recently admitted with similar presentation from 11/22-11/27 where workup was negative for PD catheter associated peritonitis.    Per chart review the patient lives in a two-level home with the ability to live on the first level. She was independent prior to arrival with use of rolling walker more recently and has become more sedentary overall. She currently  requires min assist to stand, RW for support and min A to steady. Patient continues to be limited by decreased activity, impaired balance/postural reactions and global weakness. Therapy evaluations completed due to patient decreased functional mobility was admitted for a comprehensive rehab program. Pt was admitted to CIR on 09/27/24.  Dysphagia: Pt presents with s/s of esophageal dysphagia. Pt was referred for SLP assessment due to self report of swallowing difficulty. She reported onset of metallic taste and difficulty swallowing with solids > liquids. Pt has a PMHx including GERD and recent esophageal dilation. She reports limited difference as a result of dilation though after SLP review pt had normal findings. Oral mechanism exam completed and was largely WNL. Bedside swallow evaluation completed with thin liquids and solids. She consumed thin liquids via straw with self report of effortful swallow. She was without s/s aspiration. She consumed mixed consistency solids which she reported has been easiest texture to consume. Mastication appeared normal. Pt again, reported effortful swallow and globus sensation. SLP recommending continuation of regular solids and thin liquids in order to liberalize diet as pt reports limiting PO intake due to symptoms. SLP recommending esophageal precautions including small bites, slow rate, alternating liquids and solids. SLP also recommending follow up with GI. No further skilled SLP intervention warranted at this time.    Skilled Therapeutic Interventions          BSE and informal assessment measures administered. Please see full report for additional details.     SLP Assessment  Patient does not need any further Speech Lanaguage Pathology Services    Recommendations  Recommended Consults: Consider GI evaluation SLP Diet Recommendations: Age appropriate regular solids;Thin Liquid Administration via: Straw;Cup Medication Administration: Whole meds with  liquid Supervision: Patient able to self feed Compensations: Slow rate;Small sips/bites;Follow solids with liquid Postural Changes and/or Swallow Maneuvers: Seated upright 90 degrees;Upright 30-60 min after meal Oral Care Recommendations: Oral care BID Patient destination: Home Equipment Recommended: None recommended by SLP    SLP Frequency     SLP Duration  SLP Intensity  SLP Treatment/Interventions            Pain Pain Assessment Pain Scale: 0-10 Pain Score: 0-No pain Pain Location: Back Pain Intervention(s): Medication (See eMAR)  Prior Functioning Cognitive/Linguistic Baseline: Within functional limits Type of Home: Other(Comment) (townhouse)  Lives With: Alone;Daughter Available Help at Discharge: Family;Available 24 hours/day Vocation: On disability   Care Tool Care Tool Cognition Ability to hear (with hearing aid or hearing appliances if normally used Ability to hear (with hearing aid or hearing appliances if normally used): 0. Adequate - no difficulty in normal conservation, social interaction, listening to TV   Expression of Ideas and Wants Expression of  Ideas and Wants: 4. Without difficulty (complex and basic) - expresses complex messages without difficulty and with speech that is clear and easy to understand   Understanding Verbal and Non-Verbal Content Understanding Verbal and Non-Verbal Content: 4. Understands (complex and basic) - clear comprehension without cues or repetitions  Memory/Recall Ability Memory/Recall Ability : Current season;That he or she is in a hospital/hospital unit   Bedside Swallowing Assessment General Diet Prior to this Study: Regular;Thin liquids (Level 0) Temperature Spikes Noted: No Respiratory Status: Room air History of Recent Intubation: No Oral Cavity - Dentition: Adequate natural dentition Vision: Functional for self-feeding Patient Positioning: Upright in bed Baseline Vocal Quality: Normal Volitional Cough:  Strong Volitional Swallow: Able to elicit  Oral Care Assessment Oral Assessment  (WDL): Exceptions to WDL Lips: Symmetrical Teeth: Intact Tongue: Pink Mucous Membrane(s): Moist Saliva: Moist, saliva free flowing Level of Consciousness: Alert Is patient on any of following O2 devices?: None of the above Nutritional status: No high risk factors Oral Assessment Risk : Low Risk Ice Chips Ice chips: Not tested Thin Liquid Thin Liquid: Within functional limits Presentation: Straw;Self Fed Nectar Thick Nectar Thick Liquid: Not tested Honey Thick Honey Thick Liquid: Not tested Puree Puree: Not tested Solid Solid: Within functional limits BSE Assessment Risk for Aspiration Impact on safety and function: No limitations Other Related Risk Factors: History of dysphagia;History of GERD;History of esophageal-related issues  Short Term Goals: Week 1:    Refer to Care Plan for Long Term Goals  Recommendations for other services: None   Discharge Criteria: Patient will be discharged from SLP if patient refuses treatment 3 consecutive times without medical reason, if treatment goals not met, if there is a change in medical status, if patient makes no progress towards goals or if patient is discharged from hospital.  The above assessment, treatment plan, treatment alternatives and goals were discussed and mutually agreed upon: by patient  Joane GORMAN Fuss 10/07/2024, 3:33 PM

## 2024-10-07 NOTE — Progress Notes (Signed)
 Physical Therapy Discharge Summary  Patient Details  Name: [name] MRN: [id number] Date of Birth: 07-01-1966  Date of Discharge from PT service:October 07, 2024  Today's Date: 10/07/2024 PT Individual Time: 1102-1158 PT Individual Time Calculation (min): 56 min    Patient has met 9 of 9 long term goals due to improved activity tolerance, improved balance, increased strength, decreased pain, ability to compensate for deficits, improved awareness, and improved coordination.  Patient to discharge at an ambulatory level mod I/supervision.   Patient's care partner is independent to provide the necessary physical assistance at discharge.  Recommendation:  Patient will benefit from ongoing skilled PT services in home health setting to continue to advance safe functional mobility, address ongoing impairments in strength, balance, endurance, gait, and minimize fall risk.  Equipment: RW  Reasons for discharge: treatment goals met and discharge from hospital  Patient/family agrees with progress made and goals achieved: Yes  PT Discharge Precautions/Restrictions Precautions Precautions: Fall Recall of Precautions/Restrictions: Intact Precaution/Restrictions Comments: PD catheter at mid lower abdomen, triple lumen central line in R jugular, on ART for HIV(+) Restrictions Weight Bearing Restrictions Per Provider Order: No Pain Interference Pain Interference Pain Effect on Sleep: 2. Occasionally Pain Interference with Therapy Activities: 2. Occasionally Pain Interference with Day-to-Day Activities: 2. Occasionally Vision/Perception  Vision - History Ability to See in Adequate Light: 0 Adequate Perception Perception: Within Functional Limits Praxis Praxis: WFL  Cognition Overall Cognitive Status: Within Functional Limits for tasks assessed Arousal/Alertness: Awake/alert Orientation Level: Oriented X4 Memory: Appears intact Awareness: Appears intact Problem Solving:  Appears intact Safety/Judgment: Appears intact Sensation Sensation Light Touch: Appears Intact Hot/Cold: Appears Intact Proprioception: Appears Intact Stereognosis: Appears Intact Coordination Gross Motor Movements are Fluid and Coordinated: No Fine Motor Movements are Fluid and Coordinated: No Coordination and Movement Description: movement and balance affected by decreased strength and endurance deficits however greatly improved since eval, 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 Motor  Motor Motor: Within Functional Limits Motor - Skilled Clinical Observations: movement and balance mainly affected by decreased strength and endurance deficits however greatly improvd since eval  Mobility Bed Mobility Bed Mobility: Rolling Right;Rolling Left;Supine to Sit;Sit to Supine Rolling Right: Independent with assistive device Rolling Left: Independent with assistive device Supine to Sit: Independent with assistive device Sit to Supine: Independent with assistive device Transfers Transfers: Sit to Stand;Stand to Sit;Stand Pivot Transfers Sit to Stand: Independent with assistive device Stand to Sit: Independent with assistive device Stand Pivot Transfers: Independent with assistive device Transfer (Assistive device): Rolling walker Locomotion  Gait Ambulation: Yes Gait Assistance: Independent with assistive device Gait Distance (Feet): 160 Feet Assistive device: Rolling walker Gait Gait: Yes Gait Pattern: Impaired Gait Pattern: Step-through pattern;Decreased stride length;Decreased hip/knee flexion - right;Decreased hip/knee flexion - left Gait velocity: decreased Pick up small object from the floor assist level: Independent with assistive device Pick up small object from the floor assistive device: RW and Engineer, Manufacturing Wheelchair Mobility: No  Trunk/Postural Assessment  Cervical Assessment Cervical Assessment: Within Functional Limits Thoracic  Assessment Thoracic Assessment: Within Functional Limits Lumbar Assessment Lumbar Assessment: Within Functional Limits Postural Control Postural Control: Within Functional Limits  Balance Balance Balance Assessed: Yes Static Sitting Balance Static Sitting - Balance Support: No upper extremity supported Static Sitting - Level of Assistance: 6: Modified independent (Device/Increase time) Dynamic Sitting Balance Dynamic Sitting - Balance Support: Left upper extremity supported;Right upper extremity supported;Feet supported;During functional activity Dynamic Sitting - Level of Assistance: 6: Modified independent (Device/Increase time) Static Standing Balance Static  Standing - Balance Support: During functional activity Static Standing - Level of Assistance: 6: Modified independent (Device/Increase time) Dynamic Standing Balance Dynamic Standing - Balance Support: Right upper extremity supported;Left upper extremity supported Dynamic Standing - Level of Assistance: 6: Modified independent (Device/Increase time) Extremity Assessment  RUE Assessment RUE Assessment: Within Functional Limits LUE Assessment LUE Assessment: Within Functional Limits RLE Assessment RLE Assessment: Exceptions to [facility] General Strength Comments: functionally, grossly 4/5 LLE Assessment LLE Assessment: Exceptions to [facility] General Strength Comments: functionally, grossly 4-/5  Today's Interventions  Treatment Session 1   Pt seated in WC upon arrival. Pt agreeable to therapy. Pt endorses 8/10 R side abdominal/back pain, requesting pain medicine. Pt endorses feeling bloated post dialysis this morning with nausea. Pt requesting pain and anti nausea medication. Notified nurse. Nurse present to administer during session.   Pt overall endorses increased fatigue 2/2 dialysis and difficulty sleeping last night due to the noises of the machine.   PT performed discharge assessment, see above. Pt navigated 4 5 inch curb  step with RW and supervision. Pt navigated 8 6 inch steps with B handrails and supervision,  no LOB however pt endorses increased fatigue requring rest break. Recommending supervision initially with stair navigation and PT emphasized energy conservation techniques, pt provided teach back.   Reviewed HEP, handout provided. Pt politely declining performing during session 2/2 nausea and not feeling well. Education provided for performing at home for endurance when able to tolerate with pain and fatigue. Pt verbalized understanding and agreeable.   Access Code: [email or web address] URL: [email or web address] Date: 10/07/2024 Prepared by: [name]  Exercises - Sit to Stand with Armchair  - 1 x daily - 7 x weekly - 3 sets - 10 reps - Standing March with Counter Support  - 1 x daily - 7 x weekly - 3 sets - 10 reps - Standing Hip Extension with Counter Support  - 1 x daily - 7 x weekly - 3 sets - 10 reps - Standing Hip Abduction with Counter Support  - 1 x daily - 7 x weekly - 3 sets - 10 reps  Pt made mod I in room. Pt set up room to reduce clutter for pt safety. Pt demonstrated ability to navigate room with RW and mod I.   Pt seated in [facility] with all needs within reach at end of session.     [facility] Whitaker, [place], DPT   10/07/2024, 11:11 AM

## 2024-10-07 NOTE — Plan of Care (Signed)
" °  Problem: Consults Goal: RH GENERAL PATIENT EDUCATION Description: See Patient Education module for education specifics. Outcome: Progressing   Problem: RH BOWEL ELIMINATION Goal: RH STG MANAGE BOWEL WITH ASSISTANCE Description: STG Manage Bowel with Assistance. Outcome: Progressing   Problem: RH SKIN INTEGRITY Goal: RH STG SKIN FREE OF INFECTION/BREAKDOWN Description: Manage skin free of infection with mod I assistance Outcome: Progressing   Problem: RH SAFETY Goal: RH STG ADHERE TO SAFETY PRECAUTIONS W/ASSISTANCE/DEVICE Description: STG Adhere to Safety Precautions With mod I Assistance/Device. Outcome: Progressing   Problem: RH PAIN MANAGEMENT Goal: RH STG PAIN MANAGED AT OR BELOW PT'S PAIN GOAL Description: <4 w/ prns Outcome: Progressing   Problem: RH KNOWLEDGE DEFICIT GENERAL Goal: RH STG INCREASE KNOWLEDGE OF SELF CARE AFTER HOSPITALIZATION Description: Manage increase knowledge of self care after hospitalization with mod I assistance from family using educational materials provided Outcome: Progressing   Problem: Education: Goal: Ability to describe self-care measures that may prevent or decrease complications (Diabetes Survival Skills Education) will improve Outcome: Progressing Goal: Individualized Educational Video(s) Outcome: Progressing   Problem: Coping: Goal: Ability to adjust to condition or change in health will improve Outcome: Progressing   Problem: Fluid Volume: Goal: Ability to maintain a balanced intake and output will improve Outcome: Progressing   Problem: Health Behavior/Discharge Planning: Goal: Ability to identify and utilize available resources and services will improve Outcome: Progressing Goal: Ability to manage health-related needs will improve Outcome: Progressing   Problem: Metabolic: Goal: Ability to maintain appropriate glucose levels will improve Outcome: Progressing   Problem: Nutritional: Goal: Maintenance of adequate  nutrition will improve Outcome: Progressing Goal: Progress toward achieving an optimal weight will improve Outcome: Progressing   Problem: Skin Integrity: Goal: Risk for impaired skin integrity will decrease Outcome: Progressing   Problem: Tissue Perfusion: Goal: Adequacy of tissue perfusion will improve Outcome: Progressing   "

## 2024-10-07 NOTE — Plan of Care (Signed)
" °  Problem: RH Dressing Goal: LTG Patient will perform upper body dressing (OT) Description: LTG Patient will perform upper body dressing with assist, with/without cues (OT). Outcome: Completed/Met Flowsheets (Taken 09/28/2024 1430 by Leonce Salvo D, OT) LTG: Pt will perform upper body dressing with assistance level of:  Independent  Independent with assistive device Goal: LTG Patient will perform lower body dressing w/assist (OT) Description: LTG: Patient will perform lower body dressing with assist, with/without cues in positioning using equipment (OT) Outcome: Completed/Met Flowsheets (Taken 10/07/2024 1210) LTG: Pt will perform lower body dressing with assistance level of: Independent with assistive device   Problem: RH Toileting Goal: LTG Patient will perform toileting task (3/3 steps) with assistance level (OT) Description: LTG: Patient will perform toileting task (3/3 steps) with assistance level (OT)  Outcome: Completed/Met Flowsheets (Taken 10/07/2024 1210) LTG: Pt will perform toileting task (3/3 steps) with assistance level: Independent with assistive device   Problem: RH Functional Use of Upper Extremity Goal: LTG Patient will use RT/LT upper extremity as a (OT) Description: LTG: Patient will use right/left upper extremity as a stabilizer/gross assist/diminished/nondominant/dominant level with assist, with/without cues during functional activity (OT) Outcome: Completed/Met   Problem: RH Simple Meal Prep Goal: LTG Patient will perform simple meal prep w/assist (OT) Description: LTG: Patient will perform simple meal prep with assistance, with/without cues (OT). Flowsheets (Taken 10/07/2024 1210) LTG: Pt will perform simple meal prep with assistance level of: Independent with assistive device LTG: Pt will perform simple meal prep w/level of: Ambulate with device   Problem: RH Light Housekeeping Goal: LTG Patient will perform light housekeeping w/assist (OT) Description:  LTG: Patient will perform light housekeeping with assistance, with/without cues (OT). Outcome: Completed/Met Flowsheets (Taken 10/07/2024 1210) LTG: Pt will perform light housekeeping with assistance level of: Independent with assistive device LTG: Pt will perform light housekeeping w/level of: Ambulate with device   Problem: RH Toilet Transfers Goal: LTG Patient will perform toilet transfers w/assist (OT) Description: LTG: Patient will perform toilet transfers with assist, with/without cues using equipment (OT) Outcome: Completed/Met Flowsheets (Taken 10/07/2024 1210) LTG: Pt will perform toilet transfers with assistance level of: Independent with assistive device   Problem: RH Tub/Shower Transfers Goal: LTG Patient will perform tub/shower transfers w/assist (OT) Description: LTG: Patient will perform tub/shower transfers with assist, with/without cues using equipment (OT) Outcome: Completed/Met Flowsheets (Taken 10/07/2024 1210) LTG: Pt will perform tub/shower stall transfers with assistance level of: Independent with assistive device LTG: Pt will perform tub/shower transfers from: Walk in shower   Problem: RH Furniture Transfers Goal: LTG Patient will perform furniture transfers w/assist (OT/PT) Description: LTG: Patient will perform furniture transfers  with assistance (OT/PT). Outcome: Completed/Met Flowsheets (Taken 10/07/2024 1210) LTG: Pt will perform furniture transfers with assist:: Independent with assistive device    "

## 2024-10-07 NOTE — Progress Notes (Signed)
 "                                                        PROGRESS NOTE   Subjective/Complaints: No new complaints or concerns this AM. Reports she is a little anxious about DC tomorrow.   LBM yesterday  ROS:  Pt denies fevers, chills, SOB, abd pain, CP, N/V/C/D, and vision changes, +decreased appetite +Back pain controlled usually   Objective:   No results found.  Recent Labs    10/05/24 0535 10/06/24 0645  WBC 14.6* 12.2*  HGB 9.0* 9.5*  HCT 26.3* 28.5*  PLT 274 223   Recent Labs    10/07/24 0533  NA 136  K 4.2  CL 95*  CO2 23  GLUCOSE 133*  BUN 55*  CREATININE 11.10*  CALCIUM  8.8*    Intake/Output Summary (Last 24 hours) at 10/07/2024 1026 Last data filed at 10/07/2024 0900 Gross per 24 hour  Intake 550 ml  Output 409 ml  Net 141 ml        Physical Exam: Vital Signs Blood pressure 121/66, pulse 86, temperature 98.2 F (36.8 C), resp. rate 18, height 5' 5 (1.651 m), weight 76.6 kg, last menstrual period 03/31/2014, SpO2 97%. General: awake, alert, appropriate, laying in bed, appears comfortable HENT: conjugate gaze; oropharynx moist CV: regular rate and rhythm; no JVD Pulmonary: CTA B/L; non-labored breathing GI: soft, NT, ND, (+)BS- PD catheter in place Psychiatric: appropriate- interactive  PRIOR EXAMS: Neurological: Ox3 Skin: PD site clean, dressed. A few scattered bruises R neck CVL out-  Neuro:   Alert and awake. Normal insight and awareness. Normal language and speech. Cranial nerve exam unremarkable. Moving all 4 extremities to gravity and resistance. Stocking glove sensory loss below ankles and to a lesser extent in both hands.  No abnl resting tone.    Assessment/Plan: 1. Functional deficits which require 3+ hours per day of interdisciplinary therapy in a comprehensive inpatient rehab setting. Physiatrist is providing close team supervision and 24 hour management of active medical problems listed below. Physiatrist and rehab team  continue to assess barriers to discharge/monitor patient progress toward functional and medical goals  Care Tool:  Bathing    Body parts bathed by patient: Right arm, Left arm, Chest, Abdomen, Front perineal area, Buttocks, Right upper leg, Left upper leg, Face, Right lower leg, Left lower leg   Body parts bathed by helper: Left lower leg, Right lower leg     Bathing assist Assist Level: Independent with assistive device     Upper Body Dressing/Undressing Upper body dressing   What is the patient wearing?: Pull over shirt    Upper body assist Assist Level: Independent    Lower Body Dressing/Undressing Lower body dressing      What is the patient wearing?: Pants     Lower body assist Assist for lower body dressing: Independent with assitive device Assistive Device Comment: rest breaks due to fatigue   Toileting Toileting    Toileting assist Assist for toileting: Independent with assistive device Assistive Device Comment: raised toilet   Transfers Chair/bed transfer  Transfers assist     Chair/bed transfer assist level: Independent with assistive device Chair/bed transfer assistive device: Geologist, Engineering   Ambulation assist      Assist level: Contact Guard/Touching assist Assistive device: Walker-rolling Max  distance: 140ft   Walk 10 feet activity   Assist     Assist level: Contact Guard/Touching assist Assistive device: Walker-rolling   Walk 50 feet activity   Assist Walk 50 feet with 2 turns activity did not occur: Safety/medical concerns  Assist level: Contact Guard/Touching assist Assistive device: Walker-rolling    Walk 150 feet activity   Assist Walk 150 feet activity did not occur: Safety/medical concerns  Assist level: Contact Guard/Touching assist Assistive device: Walker-rolling    Walk 10 feet on uneven surface  activity   Assist Walk 10 feet on uneven surfaces activity did not occur: Safety/medical  concerns         Wheelchair     Assist Is the patient using a wheelchair?: Yes (may not require by d/c for use at home) Type of Wheelchair: Manual    Wheelchair assist level: Dependent - Patient 0% Max wheelchair distance: 300 ft    Wheelchair 50 feet with 2 turns activity    Assist        Assist Level: Dependent - Patient 0%   Wheelchair 150 feet activity     Assist      Assist Level: Dependent - Patient 0%   Blood pressure 121/66, pulse 86, temperature 98.2 F (36.8 C), resp. rate 18, height 5' 5 (1.651 m), weight 76.6 kg, last menstrual period 03/31/2014, SpO2 97%.   Medical Problem List and Plan: 1. Functional deficits secondary to debility related to circulatory shock, adrenal insufficiency/pituitary apoplexy, new CHF             -patient may  shower             -ELOS/Goals: 7-10 days, mod I goals.              -family is looking at some options for intermittent assistance at home  - Head CT completed last night 12/13 when she bumped her head, no acute abnormalities   D/C 12/23  Con't CIR PT and OT  WBC down to 12.2  DC home tomorrow  2.  Antithrombotics: -DVT/anticoagulation:  Mechanical: Sequential compression devices, below knee Bilateral lower extremities Pharmaceutical: Heparin   12/16- Heparin  held due to dropping Hb             -antiplatelet therapy: Aspirin     3. Pain Management:  Tylenol  and Oxycodone  prn.   12/16- denies pain this AM- con't regimen 4. Mood/Behavior/Sleep: LCSW to follow for evaluation and support when available.              -antipsychotic agents: Buspar  10 mg BID prn for anxiety and Remeron  7.5 mg at bedtime.              -pt seems positive. Family very supportive  12/18- poor sleep- added Melatonin and has Trazodone  25 mg prn if need be  12/19 slept great with 25 mg Trazodone  last night- con't prn 5. Neuropsych/cognition: This patient is capable of making decisions on her own behalf.   6. Skin/Wound Care: Routine  pressure relief measures.   12/17- Pulled CVL since appeared like getting infected 7. Fluids/Electrolytes/Nutrition: Monitor I&O and weight. Follow up labs CBC/CMP               -GERD: Omeprazole  40 mg bid    8. 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    12/18- changed to PO to get rid of CVL- also could be cause of leukocytosis?  12/19- WBC doing better 9.ESRD: Continue nightly  PD per nephrology.              -pt was independent with management of her PD at home  -12/14, discussed Renvela  with nephrology, they plan to resume  12/16- K+ 3.2- per renal-   12/18-12/19-  K+ still  3.7 10.Hx of papillary thyroid  cancer s/p thyroidectomy: TSH normal, continue Synthroid .    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    12. HFrEF, without acute exacerbation: Nephrology to manage volume status. Cardiology following. Continue heart healthy diet and GDMT as tolerated. Daily weights-appears stable  12/18- Weight 77.7 kg- doing well  12/19- WBC 78.7 kg- overall stable  12/22 wt appears stable  Filed Weights   10/04/24 0548 10/06/24 0532 10/07/24 0500  Weight: 78.7 kg 77.5 kg 76.6 kg      13. Low back pain/Abdominal pain: present since aortic dissection 11/24. Continue to monitor. MRI T and L spine negative, RUQ US  negative for acute findings.              -continue Lidocaine  patch              -pt is able to work through discomfort  12/16- Been having abd pain/back pain for 2 weeks- CT didn't show any acute pathology- did show Cirrhosis, cholelithiasis and B/L renal stones.  14. HTN: Monitor BP per protocol, continue Metoprolol   12/22 BP stable      10/07/2024    5:40 AM 10/07/2024    5:00 AM 10/06/2024    7:56 PM  Vitals with BMI  Weight  168 lbs 14 oz   BMI  28.1   Systolic 121  134  Diastolic 66  76  Pulse 86  92    15. HLD: continue Crestor  10 mg    16.  HIV: CD4 445 on 10/15. Continue home [name]  and Descovy               12/16- will call ID about rising WBC- esp in light of HIV  12/17- d/w ID- they feel HIV is not contributing to issues  17. T2DM: Monitor glucose ac/hs with SSI.              -associated peripheral neuropathy  Highest CBG 108- will d/c HS correction scale  12/22 CBG elevated this AM, monitor trend   -CBG (last 3)  Recent Labs    10/06/24 1628 10/06/24 1957 10/07/24 0542  GLUCAP 101* 108* 323*   18. Chronic nausea/poor appetite  -12/13 Ate 50%, continue monitor intake  -Continue Remaron  12/17- Still poor appetite- but taking supplements when needed  12/19- eating a little better per pt  19. Diarrhea  -12/13 LBM recorded yesterday type 4, medium, continue to monitor   -pt interested in trying probiotics, will start  12/16- Denies diarrhea- LBM yesterday  12/17- Pt has had 2 formed and 2 mushy stools in last 5 days- LBM 2 days ago- on enteric precautions since was found to have C diff (-) in 11/25- pending C diff when pt has BM  12/18- off C diff enteric precautions- No Sx's of C diff- also LBM formed yesterday evening  12/19- Diarrhea resolved- LBM 2 days ago   12/22 LBM yesterday large, type 6 20. Leukocytosis  12/15- WBC 15.3 up from 13k -will check if voids at all- and order U/A and Cx if so and check CXR- will also order labs for AM- and  might need to remove CVL in  R neck-   12/16- got Blood C'x- so far, nothing back yet- WBC up somewhat to 15.8- from 15.3- will call ID- is anuric, so don't have source there and CXR was (-) for acute issues. - spoke to ID- they don't feel it's worrisome, but keep an eye on Cx's- and manage if clinically gets worse. 12/17- Pulled CVL on R neck- wasn't looking well- Cultures so far 2 days (-)  12/18- Cultures day 3 (-) and peritoneal fluid has no WBCs or organisms so far- done 3am today 12/19- Cx pending on peritoneal fluids, but no WBCs or organisms- WBC down to 14.8, so  trending down- happy to see this- again, asymptomatic -con't to monitor Cx reviewed and appears wnl. WBC reviewed and is downtrendingg 12/22 WBC down to 12.2 yesterday  21. Acute on chronic anemia  12/15- Due to ESRD- but also dropped almost 1 unit since Saturday- which is 1 unit lower than average last 1 week. Will check CT of abd/pelvis and hemoccult- also reached out to Renal about this.   12/16- CT was (-) for acute pathology that would explain- Hemoccult (+)- stopped Heparin  SQ- and GI called- will check into this.  Also Hb up somewhat to 8.1 today  12/17- GI wants C diff checked- with PCR- could be colitis? Don't feel colonoscopy will add much they will consider colonoscopy if Hb continues to drift down with no other source  12/18- Hb 7.8 today- has dropped a little- hemoccult (+) x2- but also has hemorrhoids it appears-will con't to check daily and make sure doesn't drop more.    12/19- HB 7.7- stable for 2 days- will con't to monitor  12/21: Hgb reviewed and is improved  22) Metallic taste in her mouth: requested pharmacy to please review her medications to see if any have metallic taste as a side effect- melatonin, buspar , and protonix  were identified, requested nursing to please discuss with patient if she would like any of these stopped and she would like melatonin d/ced  23) Decreased appetite: consulted nephrology regarding whether IVF can be started and they would like to assess patient first and she complained of puffiness yesterday   10 days A FACE TO FACE EVALUATION WAS PERFORMED  Murray Collier 10/07/2024, 10:26 AM     "

## 2024-10-07 NOTE — Progress Notes (Incomplete)
 Inpatient Rehabilitation Discharge Medication Review by a Pharmacist  A complete drug regimen review was completed for this patient to identify any potential clinically significant medication issues.  High Risk Drug Classes Is patient taking? Indication by Medication  Antipsychotic No   Anticoagulant No   Antibiotic No   Opioid Yes Oxycodone : pain  Antiplatelet Yes Aspirin : NSTEMI  Hypoglycemics/insulin  No   Vasoactive Medication Yes Metoprolol : HTN, HF  Chemotherapy No   Other Yes Tylenol , Lidoderm : pain Allopurinol : elevated uric acid Dolutegravir , Descovy : HIV Crestor : HLD Buspirone : anxiety Remeron : sleep/mood Trazodone : sleep Levothyroxine : hypothyroid Pepcid , prilosec: GERD Zofran : N/V Renvela : CKD Aranesp : anemia of chronic disease Calcitriol : secondary hyperparathyroid Florinef , hydrocortisone : adrenal insufficiency Xyzal , Flovent : allergies     Type of Medication Issue Identified Description of Issue Recommendation(s)  Drug Interaction(s) (clinically significant)     Duplicate Therapy     Allergy     No Medication Administration End Date     Incorrect Dose     Additional Drug Therapy Needed     Significant med changes from prior encounter (inform family/care partners about these prior to discharge). Home meds not ordered or substituted this admission: -Auryxia  -Minocycline  Restart or discontinue as appropriate. Communicate medication changes with patient/family at discharge  Other       Clinically significant medication issues were identified that warrant physician communication and completion of prescribed/recommended actions by midnight of the next day:  {Yes or No?:26198}  Name of provider notified for urgent issues identified: ***   Provider Method of Notification: ***    Pharmacist comments: ***   Time spent performing this drug regimen review (minutes): 30   Thank you for allowing pharmacy to be a part of this patients care.   [name], PharmD 10/07/2024 2:22 PM     **Pharmacist phone directory can be found on [email or web address] listed under [facility]**

## 2024-10-07 NOTE — Telephone Encounter (Signed)
 We have not received any forms as of this morning. LM for pt to return my call.

## 2024-10-07 NOTE — Discharge Summary (Signed)
 Physician Discharge Summary  Patient ID: [name] MRN: [id number] DOB/AGE: 56-Jun-1967 56 y.o.  Admit date: 09/27/2024 Discharge date: 10/07/2024  Discharge Diagnoses:  Principal Problem:   Debility Active Problems:   HIV (human immunodeficiency virus infection) (HCC)   Depression   Hypertension   Asthma   Type 2 diabetes mellitus with hyperlipidemia (HCC)   Hyperlipidemia   ESRD (end stage renal disease) (HCC)   Normocytic anemia   Esophageal dysphagia   Neuropathy due to chemotherapeutic drug   Chronic gout   Hypothyroidism   Leukocytosis   Heme positive stool   Cognitive and neurobehavioral dysfunction   Discharged Condition: stable  Significant Diagnostic Studies: DG Chest 2 View Result Date: 09/30/2024 EXAM: 2 VIEW(S) XRAY OF THE CHEST 09/30/2024 01:30:00 PM COMPARISON: Comparison with 09/23/2024. CLINICAL HISTORY: Leukocytosis. FINDINGS: LINES, TUBES AND DEVICES: Right central venous catheter has been placed with tip projecting over the mid SVC region. LUNGS AND PLEURA: Shallow inspiration. No focal pulmonary opacity. No pleural effusion. No pneumothorax. HEART AND MEDIASTINUM: Postoperative changes with sternotomy wires and aortic stent graft. Heart size and pulmonary vascularity are normal. BONES AND SOFT TISSUES: Surgical clips in the base of the neck. No acute osseous abnormality. IMPRESSION: 1. Right central venous catheter with tip over the mid SVC, without pneumothorax. Electronically signed by: Elsie Gravely MD 09/30/2024 01:50 PM EST RP Workstation: [id number]   CT ABDOMEN PELVIS WO CONTRAST Result Date: 09/30/2024 CLINICAL DATA:  Anemia, history of aortic dissection. End-stage renal disease. EXAM: CT ABDOMEN AND PELVIS WITHOUT CONTRAST TECHNIQUE: Multidetector CT imaging of the abdomen and pelvis was performed following the standard protocol without IV contrast. RADIATION DOSE REDUCTION: This exam was performed according to the departmental  dose-optimization program which includes automated exposure control, adjustment of the mA and/or kV according to patient size and/or use of iterative reconstruction technique. COMPARISON:  CT abdomen pelvis 09/07/2024. FINDINGS: Lower chest: Bibasilar scarring and volume loss. Partially imaged endovascular stent graft in the lower descending thoracic aorta, extending to the diaphragmatic hiatus. Descending thoracic aorta measures up to approximately 5.1 cm. Heart is enlarged. Age advanced right coronary artery calcification. No pericardial or pleural effusion. Distal esophagus is grossly unremarkable. Hepatobiliary: Liver margin is slightly irregular and the liver is enlarged, 18.2 cm. Noncalcified gallstones. Tiny perihepatic ascites. No biliary ductal dilatation. Pancreas: Negative. Spleen: Negative. Adrenals/Urinary Tract: Right adrenal gland is unremarkable. Left adrenal adenoma. No specific follow-up necessary. Kidneys are mildly atrophic. Excreted contrast in the right renal collecting system related to CT angiography 09/23/2024. Bilateral renal stones and/or renal vascular calcifications. Ureters are decompressed. Contrast is seen in a low volume bladder. Stomach/Bowel: Stomach, small bowel, appendix and colon are unremarkable. Vascular/Lymphatic: Atherosclerotic calcification of the aorta. No pathologically enlarged lymph nodes. Calcified dissection flap in the aorta. Further assessment is limited without IV contrast. Reproductive: No adnexal mass. Other: Small pelvic free fluid. Percutaneous dialysis catheter terminates in the anatomic pelvis. Small umbilical hernia contains fat. Small bilateral inguinal hernias contain fat. Musculoskeletal: Degenerative changes in the spine. IMPRESSION: 1. No acute findings. 2. Age advanced right coronary artery calcification. 3. Cirrhosis. 4. Cholelithiasis. 5. Minimal ascites with peritoneal dialysis catheter in place. 6. Left adrenal adenoma. 7. Bilateral renal stones  and/or renal vascular calcifications. 8. Partially visualized endovascular stent graft in an aneurysmal descending thoracic aorta, with a chronic dissection flap in the abdominal aorta. 9.  Aortic atherosclerosis (ICD10-I70.0). Electronically Signed   By: Newell Eke M.D.   On: 09/30/2024 10:44   CT HEAD WO CONTRAST (  ) Result Date: 09/29/2024 EXAM: CT HEAD WITHOUT 09/28/2024 11:54:00 PM TECHNIQUE: CT of the head was performed without the administration of intravenous contrast. Automated exposure control, iterative reconstruction, and/or weight based adjustment of the mA/kV was utilized to reduce the radiation dose to as low as reasonably achievable. COMPARISON: 07/30/2024 CLINICAL HISTORY: Head trauma, minor, normal mental status (Age 36-64y); ?trauma, hit head on door frame during transfer with family. FINDINGS: BRAIN AND VENTRICLES: No acute intracranial hemorrhage. No mass effect or midline shift. No extra-axial fluid collection. No evidence of acute infarct. No hydrocephalus. Intracranial atherosclerosis. Prior pituitary mass is less conspicuous on the current study. ORBITS: No acute abnormality. SINUSES AND MASTOIDS: No acute abnormality. SOFT TISSUES AND SKULL: No acute skull fracture. No acute soft tissue abnormality. IMPRESSION: 1. No acute intracranial abnormality. Electronically signed by: Pinkie Pebbles MD 09/29/2024 12:08 AM EST RP Workstation: [id number]   CARDIAC CATHETERIZATION Result Date: 09/26/2024 1.  Tortuous but normal coronary arteries. 2.  Left ventricular angiography was not performed.  EF was severely reduced by echo. 3.  Right heart catheterization showed normal right atrial pressure, mild pulmonary hypertension, mildly elevated wedge pressure and normal cardiac output RA: 5 mmHg RV: 37/8 mmHg PW: 18 mmHg PA: 37/18 with a mean of 23 mmHg Cardiac output/index: 5.59/2.94. Recommendations: Medical therapy for nonischemic cardiomyopathy.   US  Abdomen Limited RUQ  (LIVER/GB) Result Date: 09/24/2024 EXAM: Right Upper Quadrant Abdominal Ultrasound 09/24/2024 03:50:43 PM TECHNIQUE: Real-time ultrasonography of the right upper quadrant of the abdomen was performed. COMPARISON: None available. CLINICAL HISTORY: Abdominal pain FINDINGS: LIVER: The liver demonstrates normal echogenicity. No intrahepatic biliary ductal dilatation. No evidence of mass. BILIARY SYSTEM: No pericholecystic fluid. Cholesterolosis of the gallbladder wall, suggesting Adenomyomatosis. Layering 16 mm gallstone. Common bile duct is within normal limits measuring 3 mm. OTHER: No right upper quadrant ascites. IMPRESSION: 1. Cholelithiasis without sonographic findings to suggest acute cholecystitis. 2. Gallbladder adenomyomatosis, benign. Electronically signed by: Pinkie Pebbles MD 09/24/2024 07:22 PM EST RP Workstation: [id number]   VAS US  LOWER EXTREMITY VENOUS (DVT) Result Date: 09/24/2024  Lower Venous DVT Study Patient Name:  SHARNIECE GIBBON [facility]  Date of Exam:   09/24/2024 Medical Rec #: [id number]              Accession #:    [id number] Date of Birth: 08/02/1966              Patient Gender: F Patient Age:   56 years Exam Location:  [facility] Procedure:      VAS US  LOWER EXTREMITY VENOUS (DVT) Referring Phys: TORIBIO SHARPS --------------------------------------------------------------------------------  Indications: Edema. Other Indications: Anemia, H/O thyroid  cancer. Comparison Study: No prior exam. Performing Technologist: Edilia Elden Appl  Examination Guidelines: A complete evaluation includes B-mode imaging, spectral Doppler, color Doppler, and power Doppler as needed of all accessible portions of each vessel. Bilateral testing is considered an integral part of a complete examination. Limited examinations for reoccurring indications may be performed as noted. The reflux portion of the exam is performed with the patient in reverse Trendelenburg.   +---------+---------------+---------+-----------+----------+--------------+ RIGHT    CompressibilityPhasicitySpontaneityPropertiesThrombus Aging +---------+---------------+---------+-----------+----------+--------------+ CFV      Full           Yes      Yes                                 +---------+---------------+---------+-----------+----------+--------------+ SFJ      Full  Yes      Yes                                 +---------+---------------+---------+-----------+----------+--------------+ FV Prox  Full                                                        +---------+---------------+---------+-----------+----------+--------------+ FV Mid   Full                                                        +---------+---------------+---------+-----------+----------+--------------+ FV DistalFull                                                        +---------+---------------+---------+-----------+----------+--------------+ PFV      Full                                                        +---------+---------------+---------+-----------+----------+--------------+ POP      Full           Yes      Yes                                 +---------+---------------+---------+-----------+----------+--------------+ PTV      Full                                                        +---------+---------------+---------+-----------+----------+--------------+ PERO     Full                                                        +---------+---------------+---------+-----------+----------+--------------+   +---------+---------------+---------+-----------+----------+--------------+ LEFT     CompressibilityPhasicitySpontaneityPropertiesThrombus Aging +---------+---------------+---------+-----------+----------+--------------+ CFV      Full           Yes      Yes                                  +---------+---------------+---------+-----------+----------+--------------+ SFJ      Full           Yes      Yes                                 +---------+---------------+---------+-----------+----------+--------------+ FV Prox  Full                                                        +---------+---------------+---------+-----------+----------+--------------+  FV Mid   Full                                                        +---------+---------------+---------+-----------+----------+--------------+ FV DistalFull                                                        +---------+---------------+---------+-----------+----------+--------------+ PFV      Full                                                        +---------+---------------+---------+-----------+----------+--------------+ POP      Full           Yes      Yes                                 +---------+---------------+---------+-----------+----------+--------------+ PTV      Full                                                        +---------+---------------+---------+-----------+----------+--------------+ PERO     Full                                                        +---------+---------------+---------+-----------+----------+--------------+     Summary: BILATERAL: - No evidence of deep vein thrombosis seen in the lower extremities, bilaterally. -No evidence of popliteal cyst, bilaterally.   *See table(s) above for measurements and observations. Electronically signed by Norman Serve on 09/24/2024 at 3:43:33 PM.    Final    ECHOCARDIOGRAM COMPLETE Result Date: 09/24/2024    ECHOCARDIOGRAM REPORT   Patient Name:   RAYSHA TILMON [facility] Date of Exam: 09/24/2024 Medical Rec #:  [id number]             Height:       65.0 in Accession #:    [id number]            Weight:       176.6 lb Date of Birth:  [birth year]             BSA:          1.876 m Patient Age:    58 years              BP:            128/58 mmHg Patient Gender: F                     HR:           68 bpm. Exam Location:  Inpatient Procedure: 2D Echo, Cardiac Doppler, Color Doppler  and Intracardiac            Opacification Agent (Both Spectral and Color Flow Doppler were            utilized during procedure). Indications:    Shock  History:        Patient has prior history of Echocardiogram examinations, most                 recent 01/30/2023. Risk Factors:Hypertension, Diabetes, Former                 Smoker and Dyslipidemia.  Sonographer:    Juliene Rucks Referring Phys: [id number] TORIBIO JAYSON SHARPS  Sonographer Comments: Patient is obese. IMPRESSIONS  1. Left ventricular ejection fraction, by estimation, is 30 to 35%. The left ventricle has moderately decreased function. The left ventricle demonstrates regional wall motion abnormalities (see scoring diagram/findings for description). There is mild concentric left ventricular hypertrophy. Left ventricular diastolic parameters are consistent with Grade II diastolic dysfunction (pseudonormalization). Elevated left ventricular end-diastolic pressure.  2. Right ventricular systolic function is normal. The right ventricular size is normal.  3. Left atrial size was severely dilated.  4. The mitral valve is normal in structure. Moderate to severe mitral valve regurgitation. No evidence of mitral stenosis.  5. The aortic valve is normal in structure. Aortic valve regurgitation is not visualized. No aortic stenosis is present.  6. The inferior vena cava is normal in size with greater than 50% respiratory variability, suggesting right atrial pressure of 3 mmHg. FINDINGS  Left Ventricle: Left ventricular ejection fraction, by estimation, is 30 to 35%. The left ventricle has moderately decreased function. The left ventricle demonstrates regional wall motion abnormalities. Definity  contrast agent was given IV to delineate the left ventricular endocardial borders. The left ventricular internal cavity size was  normal in size. There is mild concentric left ventricular hypertrophy. Left ventricular diastolic parameters are consistent with Grade II diastolic dysfunction (pseudonormalization). Elevated left ventricular end-diastolic pressure.  LV Wall Scoring: The entire apex is akinetic. The anterior wall, antero-lateral wall, anterior septum, inferior wall, posterior wall, mid inferoseptal segment, and basal inferoseptal segment are hypokinetic. Right Ventricle: The right ventricular size is normal. No increase in right ventricular wall thickness. Right ventricular systolic function is normal. Left Atrium: Left atrial size was severely dilated. Right Atrium: Right atrial size was normal in size. Pericardium: Trivial pericardial effusion is present. The pericardial effusion is circumferential. Mitral Valve: The mitral valve is normal in structure. Moderate to severe mitral valve regurgitation. No evidence of mitral valve stenosis. Tricuspid Valve: The tricuspid valve is normal in structure. Tricuspid valve regurgitation is not demonstrated. No evidence of tricuspid stenosis. Aortic Valve: The aortic valve is normal in structure. Aortic valve regurgitation is not visualized. No aortic stenosis is present. Pulmonic Valve: The pulmonic valve was normal in structure. Pulmonic valve regurgitation is not visualized. No evidence of pulmonic stenosis. Aorta: The aortic root is normal in size and structure. Venous: The inferior vena cava is normal in size with greater than 50% respiratory variability, suggesting right atrial pressure of 3 mmHg. IAS/Shunts: No atrial level shunt detected by color flow Doppler.  LEFT VENTRICLE PLAX 2D LVIDd:         5.20 cm      Diastology LVIDs:         3.80 cm      LV e' medial:    4.90 cm/s LV PW:         1.10 cm  LV E/e' medial:  22.7 LV IVS:        1.00 cm      LV e' lateral:   7.72 cm/s LVOT diam:     2.10 cm      LV E/e' lateral: 14.4 LV SV:         76 LV SV Index:   40 LVOT Area:     3.46  cm  LV Volumes (MOD) LV vol d, MOD A2C: 222.0 ml LV vol d, MOD A4C: 268.0 ml LV vol s, MOD A2C: 158.0 ml LV vol s, MOD A4C: 197.0 ml LV SV MOD A2C:     64.0 ml LV SV MOD A4C:     268.0 ml LV SV MOD BP:      68.3 ml RIGHT VENTRICLE             IVC RV Basal diam:  3.60 cm     IVC diam: 1.80 cm RV Mid diam:    2.50 cm RV S prime:     11.00 cm/s TAPSE (M-mode): 1.7 cm LEFT ATRIUM             Index        RIGHT ATRIUM           Index LA diam:        4.10 cm 2.19 cm/m   RA Area:     13.30 cm LA Vol (A2C):   78.1 ml 41.63 ml/m  RA Volume:   29.40 ml  15.67 ml/m LA Vol (A4C):   95.1 ml 50.69 ml/m LA Biplane Vol: 91.2 ml 48.61 ml/m  AORTIC VALVE LVOT Vmax:   107.00 cm/s LVOT Vmean:  69.800 cm/s LVOT VTI:    0.219 m  AORTA Ao Root diam: 2.90 cm MITRAL VALVE                TRICUSPID VALVE MV Area (PHT): 3.77 cm     TR Peak grad:   13.7 mmHg MV Decel Time: 201 msec     TR Vmax:        185.00 cm/s MR Peak grad: 115.8 mmHg MR Vmax:      538.00 cm/s   SHUNTS MV E velocity: 111.00 cm/s  Systemic VTI:  0.22 m MV A velocity: 101.00 cm/s  Systemic Diam: 2.10 cm MV E/A ratio:  1.10 Kardie Tobb DO Electronically signed by Dub Huntsman DO Signature Date/Time: 09/24/2024/1:41:17 PM    Final    MR LUMBAR SPINE WO CONTRAST Result Date: 09/23/2024 EXAM: MRI LUMBAR SPINE 09/23/2024 11:15:00 PM TECHNIQUE: Multiplanar multisequence MRI of the lumbar spine was performed without the administration of intravenous contrast. COMPARISON: MRI lumbar spine 06/06/2024 CLINICAL HISTORY: Lumbar radiculopathy, symptoms persist with > 6 wks treatment FINDINGS: BONES AND ALIGNMENT: Normal alignment. Normal vertebral body heights. 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 Bone marrow signal is unremarkable. SPINAL CORD: The conus terminates normally. SOFT TISSUES: Aortic aneurysm better characterized on CT chest from today. L1-L2: No significant disc herniation. No spinal canal stenosis or neural foraminal narrowing. L2-L3: No  significant disc herniation. No spinal canal stenosis or neural foraminal narrowing. L3-L4: No significant disc herniation. No spinal canal stenosis or neural foraminal narrowing. L4-L5: No significant disc herniation. Facet arthropathy. No spinal canal stenosis or neural foraminal narrowing. L5-S1: No significant disc herniation. Facet arthropathy. No spinal canal stenosis or neural foraminal narrowing. IMPRESSION: 1. No spinal canal stenosis or neural foraminal narrowing in the lumbar spine. 2. Transitional lumbosacral anatomy with sacralized L5  segment. Electronically signed by: Gilmore Molt MD 09/23/2024 11:46 PM EST RP Workstation: [id number]   MR THORACIC SPINE WO CONTRAST Result Date: 09/23/2024 EXAM: MRI Thoracic Spine Without Intravenous Contrast 09/23/2024 11:14:00 PM TECHNIQUE: Multiplanar multisequence MRI of the thoracic spine was performed without the administration of intravenous contrast. COMPARISON: None available. CLINICAL HISTORY: Myelopathy, chronic, thoracic spine FINDINGS: BONES AND ALIGNMENT: Normal alignment. Normal vertebral body heights. Bone marrow signal is unremarkable. No abnormal enhancement. SPINAL CORD: Normal spinal cord volume. Normal spinal cord signal. SOFT TISSUES: Aortic aneurysm characterized on same day CT chest. DEGENERATIVE CHANGES: No significant disc herniation. No spinal canal stenosis or neural foraminal narrowing. IMPRESSION: 1. No spinal canal stenosis or neural foraminal narrowing in the thoracic spine. 2. Aortic aneurysm characterized on same day CT chest. Electronically signed by: Gilmore Molt MD 09/23/2024 11:43 PM EST RP Workstation: [id number]   CT Angio Chest/Abd/Pel for Dissection W and/or Wo Contrast Result Date: 09/23/2024 CLINICAL DATA:  Acute aortic syndrome suspected, hypotension, altered level of consciousness EXAM: CT ANGIOGRAPHY CHEST, ABDOMEN AND PELVIS TECHNIQUE: Non-contrast CT of the chest was initially obtained. Multidetector CT  imaging through the chest, abdomen and pelvis was performed using the standard protocol during bolus administration of intravenous contrast. Multiplanar reconstructed images and MIPs were obtained and reviewed to evaluate the vascular anatomy. RADIATION DOSE REDUCTION: This exam was performed according to the departmental dose-optimization program which includes automated exposure control, adjustment of the mA and/or kV according to patient size and/or use of iterative reconstruction technique. 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 The ascending thoracic aorta measures up to 3.0 cm, mid aortic arch measures up to 4.4 cm, and descending thoracic aorta measures up to 5.0 cm in maximal dimension, similar to prior study. The graft extends to the level of the diaphragmatic hiatus, with no evidence of endoleak involving the thoracic aorta. The common trunk of the great vessels off the anterior margin of the ascending thoracic aorta is again widely patent, and the visualized portions of the great vessels are patent. The heart is unremarkable without pericardial effusion. Timing of contrast bolus limits evaluation of the pulmonary vasculature. Mediastinum/Nodes: Prior thyroidectomy. Trachea and esophagus appear unremarkable. Lungs/Pleura: Stable areas of scarring and subsegmental atelectasis, primarily at the lung bases. No acute airspace disease, effusion, or pneumothorax. Musculoskeletal: No acute or destructive bony abnormalities. Reconstructed images demonstrate no additional findings. Review of the MIP images confirms the above findings. CTA ABDOMEN AND PELVIS FINDINGS VASCULAR Aorta: The endoluminal stent graft terminates at the level of the diaphragmatic hiatus. Fusiform abdominal aortic aneurysm  is seen throughout the entirety of the abdominal aorta, measuring up to 4.3 cm at the level of the celiac axis, similar to prior study. Significant thrombus within the false lumen of the known abdominal aortic dissection is again seen, with perfusion likely due to small fenestrations as well as vascular supply from small lumbar arteries. This is similar to prior exam allowing for differences in the timing of the contrast bolus. No evidence of aortic Naylee Frankowski or rupture. Celiac: Patent without evidence of aneurysm, dissection, vasculitis or significant stenosis. Vessel arises from the true lumen. SMA: Patent without evidence of aneurysm, dissection, vasculitis or significant stenosis. Vessel arises from the true lumen. Renals: There is diffuse atherosclerosis of the bilateral renal arteries, with stable moderate stenosis of the proximal renal arteries bilaterally estimated at  50-70%. No aneurysm, dissection, or vasculitis. IMA: Patent without evidence of aneurysm, dissection, vasculitis or significant stenosis. Arises from the false lumen. Inflow: The aortic dissection extends into the left common iliac and external iliac arteries, terminating at the left common femoral artery. This is similar to previous exam. The thrombosed false lumen of the left external iliac artery results in significant stenosis, estimated 70-90%. The right common iliac, external iliac, and internal iliac vessels are widely patent. Veins: No obvious venous abnormality within the limitations of this arterial phase study. Review of the MIP images confirms the above findings. NON-VASCULAR Hepatobiliary: No focal liver abnormality is seen. No gallstones, gallbladder wall thickening, or biliary dilatation. Pancreas: Unremarkable. No pancreatic ductal dilatation or surrounding inflammatory changes. Spleen: Normal in size without focal abnormality. Adrenals/Urinary Tract: Stable thickening the adrenal glands. Bilateral renal cortical atrophy.  Nonobstructing 8 mm calculus lower pole right kidney. Remaining calcifications at the renal hila are felt to be vascular. No obstructive uropathy. Bladder is decompressed. Stomach/Bowel: No bowel obstruction or ileus. Normal appendix right lower quadrant. No bowel wall thickening or inflammatory change. Lymphatic: No pathologic adenopathy. Reproductive: Uterus and bilateral adnexa are unremarkable. Other: Small amount of pelvic free fluid is well as trace pneumoperitoneum within the right upper quadrant are likely secondary to indwelling peritoneal dialysis catheter, which is coiled in the right hemipelvis. No abdominal wall hernia. Musculoskeletal: No acute or destructive bony abnormalities. Reconstructed images demonstrate no additional findings. Review of the MIP images confirms the above findings. IMPRESSION: Vascular: 1. Stable thoracoabdominal aortic aneurysm and dissection, with no significant change in the endoluminal stent graft seen within the thoracic aorta on prior study. 2. No evidence of aortic Mohit Zirbes or rupture. 3. Stable stenoses within the bilateral renal arteries and left external iliac artery. 4.  Aortic Atherosclerosis (ICD10-I70.0). Nonvascular: 1. Persistent pneumoperitoneum and pelvic free fluid, likely due to indwelling peritoneal dialysis catheter which is coiled in the right hemipelvis. 2. Nonobstructing right renal calculus. Electronically Signed   By: Ozell Daring M.D.   On: 09/23/2024 15:13   DG Chest Port 1 View Result Date: 09/23/2024 CLINICAL DATA:  Questionable sepsis.  Evaluate for an abnormality. EXAM: PORTABLE CHEST 1 VIEW COMPARISON:  09/07/2024 FINDINGS: Again noted is a thoracic aortic stent graft. Heart size is stable and within normal limits. Median sternotomy wires. Lungs are clear without focal airspace disease or pulmonary edema. IMPRESSION: No active disease. Electronically Signed   By: Juliene Balder M.D.   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 She reported dysphagia and globus sensation during meals leading to request for esophagram. EXAM: ESOPHAGUS/BARIUM SWALLOW/TABLET STUDY TECHNIQUE: Single contrast examination was performed using thin liquid barium. This exam was performed by Brittany Huneycutt, NP, and was supervised and interpreted by Dr. Rockey Kilts. FLUOROSCOPY: Radiation Exposure Index (as provided by the fluoroscopic device): 38.3 mGy Kerma COMPARISON:  None Available. FINDINGS: Swallowing: Appears normal. No vestibular penetration or aspiration seen. Pharynx: Unremarkable. Esophagus: Normal appearance. Esophageal motility: Mild esophageal dysmotility seen as tertiary contractions within the mid esophagus, best seen in series #2. Hiatal Hernia: Absent Gastroesophageal reflux: None visualized. Ingested 13 mm barium tablet: The tablet was delayed within the mid esophagus. This passed to the distal esophagus with  subsequent sips and remained at the GE junction despite providing additional fluids and applesauce Other: Radiopaque bodies consistent with above mentioned surgical history. 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 Mild delayed passage of a 13 mm barium tablet, likely secondary to dysmotility. Electronically Signed   By: Rockey Kilts M.D.   On: 09/09/2024 15:27   CT ABDOMEN WO CONTRAST Result Date: 09/07/2024 EXAM: CT ABDOMEN WITHOUT CONTRAST 09/07/2024 10:05:51 PM TECHNIQUE: CT of the abdomen was performed without the administration of intravenous contrast. Multiplanar reformatted images are provided for review. Automated exposure control, iterative reconstruction, and/or weight based adjustment of the mA/kV was  utilized to reduce the radiation dose to as low as reasonably achievable. COMPARISON: 07/18/2024 CLINICAL HISTORY: Epigastric pain; Diffuse abd pain, N/V/D. FINDINGS: LOWER CHEST: Visualized portion of the lower chest demonstrates no acute abnormality. HEPATOBILIARY: The visualized liver is unremarkable. 1 cm gallstone layering within the gallbladder. SPLEEN: Spleen demonstrates no acute abnormality. PANCREAS: Pancreas demonstrates no acute abnormality. ADRENAL GLANDS: Diffuse enlargement of the left adrenal gland compatible with hyperplasia, stable. KIDNEYS: No stones in the kidneys or proximal ureters. No hydronephrosis. No perinephric or periureteral stranding. GI AND BOWEL: Stomach and duodenal sweep demonstrate no acute abnormality. There is no bowel obstruction. No abnormal bowel wall thickening or distension. PERITONEUM AND RETROPERITONEUM: Small amount of ascites adjacent to the liver and spleen. Stent graft repair of aortic dissection. Stent graft extends into the upper abdominal aorta. Dissection continues throughout the abdominal aorta, unchanged since prior study . No free air. LYMPH NODES: No lymphadenopathy. BONES AND SOFT TISSUES: No acute abnormality of the visualized bones. No focal soft tissue abnormality. IMPRESSION: 1. Stent graft repair of aortic dissection with continued dissection throughout the abdominal aorta, stable since prior study . 2. Small amount of ascites adjacent to the liver and spleen. Electronically signed by: Franky Crease MD 09/07/2024 10:19 PM EST RP Workstation: [id number]   DG Chest Portable 1 View Result Date: 09/07/2024 EXAM: 1 VIEW(S) XRAY OF THE CHEST 09/07/2024 08:40:00 PM COMPARISON: None available. CLINICAL HISTORY: SOB FINDINGS: LUNGS AND PLEURA: No vascular congestion, edema, or consolidation. No pleural effusion or pneumothorax. HEART AND MEDIASTINUM: Postoperative changes in the mediastinum. Thoracic aortic stent in place. Mild cardiac enlargement. BONES AND  SOFT TISSUES: Surgical clips in the base of the neck. No acute osseous abnormality. IMPRESSION: 1. No acute cardiopulmonary process. 2. Mild cardiomegaly. Electronically signed by: Elsie Gravely MD 09/07/2024 08:47 PM EST RP Workstation: [id number]    Labs:  Basic Metabolic Panel: Recent Labs  Lab 10/01/24 0428 10/02/24 0949 10/03/24 0507 10/04/24 0606 10/07/24 0533  NA 137  --  136 136 136  K 3.2*  --  3.7 3.7 4.2  CL 94*  --  94* 94* 95*  CO2 24  --  25 23 23   GLUCOSE 93  --  94 96 133*  BUN 58*  --  57* 55* 55*  CREATININE 12.31*  --  11.70* 11.70* 11.10*  CALCIUM  8.5*  --  8.8* 8.7* 8.8*  MG 2.0 2.1  --   --   --   PHOS 7.5* 6.8*  --  5.9*  --     CBC: Recent Labs  Lab 10/01/24 0428 10/02/24 0949 10/04/24 0606 10/05/24 0535 10/06/24 0645  WBC 15.8*   < > 14.8* 14.6* 12.2*  NEUTROABS 13.3*  --   --   --   --   HGB 8.1*   < > 7.7* 9.0* 9.5*  HCT 23.8*   < >  22.5* 26.3* 28.5*  MCV 108.2*   < > 106.6* 108.7* 109.2*  PLT 244   < > 235 274 223   < > = values in this interval not displayed.    CBG: Recent Labs  Lab 10/06/24 1127 10/06/24 1628 10/06/24 1957 10/07/24 0542 10/07/24 1147  GLUCAP 122* 101* 108* 323* 91    Brief HPI:   Erica Richwine is a 58 y.o. female  with PMHx Graves disease, HTN, T2DM, ESRD on PD, HIV, aortic dissection s/p repair 08/2023, recent hx of pituitary apoplexy, papillary thyroid  carcinoma s/p thyroidectomy, sarcoidosis, OSA, and breast cancer previously on chemotherapy presented to the ER ON 09/23/24 from dialysis with complaints of back pain, abdominal pain and dyspnea. EMS noted BP 90/60 with decreased radial pulses, BP in ER 64/30. She received IVF resuscitation via I/O improving blood pressures. A CTA chest/abd/pelv revealed stable thoracoabdominal aortic aneurysm and dissection with no significant change with no evidence of aortic Donjuan Robison or rupture, persistent pneumoperitoneum and pelvic free fluid in the setting of peritoneal  dialysis catheter. The patient was recently admitted with similar presentation from 11/22-11/27 where workup was negative for PD catheter associated peritonitis.    EKG demonstrated left bundle branch lock with PACs. Labs: high-sensitivity troponins 338, 392, 331, 341, sodium of 130, K+ 2.8, BUN of 39, cr 14.09, mag 2.6, WBC 7K, and hemoglobin 11.8. CXR showed no active disease.  MRI of the lumbar and thoracic spine was unrevealing for any acute findings. Central line was placed to continue IV hydration. Nephrology consulted for ESRD/PD with recommendations for empiric hydrocortisone  and fludrocortisone  as adrenal insufficiency ws suspected. She was continued on PD with electrolyte replacement. CCM admitted for septic shock. An echocardiogram was completed showing decreased LVEF 30-35% with moderate to severe MR. Cardiology consulted and the patient underwent left and right heart catheterizations with normal findings. Vascular Surgery consulted for hypotension in the setting of aortic repair, indicating no further interventions at this time.    Per chart review the patient lives in a two-level home with the ability to live on the first level. She was independent prior to arrival with use of rolling walker more recently and has become more sedentary overall. She currently requires min assist to stand, RW for support and min A to steady. Patient continues to be limited by decreased activity, impaired balance/postural reactions and global weakness. Therapy evaluations completed due to patient decreased functional mobility was admitted for a comprehensive rehab program.    Inpatient Rehabilitation Course: Henessy Rohrer was admitted to rehab 09/27/2024 for inpatient therapies to consist of PT, ST and OT at least three hours five days a week. Past admission physiatrist, therapy team and rehab RN have worked together to provide customized collaborative inpatient rehab.  Anticoagulation: Heparin   discontinued d/t decreasing hemoglobin.  Continue Asprin daily.  Pain Management:  Low back pain/Abdominal pain: present since aortic dissection 11/24. Continue to monitor. MRI T and L spine negative, RUQ US  negative for acute findings. CT didn't show any acute pathology- did show Cirrhosis, cholelithiasis and B/L renal stones.              -continue Lidocaine  patch. Tylenol  and Oxycodone  prn.   Mood/Behavior/Sleep: Positive results with Buspar  10 mg BID prn for anxiety and Remeron  7.5 mg at bedtime. Trazodone  started for insomnia, has been helpful especially during PD.    Skin/Wound Care: Wound care/signs and symptoms of infection discussed at discharge. CVL discontinued on 12/17, appeared to be likely a source of  infection.  Chronic nausea/poor appetite: Still with poor appetite, she continues Ensure supplements. Continue Remeron , discuss appetite stimulants with PCP.   GERD: Omeprazole  40 mg bid. Continue probiotic (pt provided).  Adrenal Insufficency: Continue on empiric Florinef  and Solu-Cortef , monitor cortisol with nephrology.     Hypertension: stable on metoprolol .   Hx of papillary thyroid  cancer s/p thyroidectomy: TSH normal, continue Synthroid .   History of pituitary apoplexy: s/p pituitary apoplexy, ACTH  stim test was not conducted due to questions of reliability. Continue treatment as above.   Hyperlipidemia: Continue Crestor  10 mg    T2DM: Stable overall. diabetes has been monitored with ac/hs CBG checks and SSI was use prn for tighter BS control. Diabetic teaching was completed.   Diarrhea: hx of C.diff, resolved LBM formed. Precautions removed.   Leukocytosis: Repeat blood cultures negative x 3. UA culture neg for growth. Reviewed chest xray-negative for acute concerns. Peritoneal fluids, showed no WBCs or organisms- WBC down from 14.8 to 12.2.   Acute on chronic anemia: CT was negative for acute pathology that would explain. Hemoccult (+)x 2. GI consulted with  recommendations to stop Heparin  SQ. A colonoscopy was decided not to be indicated at this time.  Hemoglobin improved from 7.8 to 9.6 at discharge. Aranesp  initiated with nephrology with plans to increase and manage outpatient. Follow up with GI outpatient.   Planned Outpatient Follow-Up:   -Cardiology -Gastroenterology  -Nephrology -PCP      Rehab course: During patient's stay in rehab weekly team conferences were held to monitor patient's progress, set goals and discuss barriers to discharge. At admission, patient required min assist to stand, RW for support and min A to steady.    Occupational Therapy: Patient has met all long term goals due to improved activity tolerance.  Patient to discharge at overall Modified Independent level.  Patient's care partner is independent to provide the necessary physical assistance at discharge. He/She will benefit from ongoing OT in home health setting to continue to advance functional skills in the area of BADL and iADL.   Physical Therapy: Patient has met all long term goals due to improved activity tolerance, improved balance, improved postural control, increased strength, decreased pain, ability to compensate for deficits, improved attention, improved awareness, and improved coordination.  Patient to discharge at an ambulatory level mod I/supervision.   He/She will benefit from ongoing skilled PT services in home health setting to continue to advance safe functional mobility, address ongoing impairments in strength, ROM, balance, endurance, gait, and minimize fall risk.    Discharge plan was discussed with patient and family member who both verbalized understanding and agreed with it.      Disposition:  Discharge disposition: 06-Home-Health Care Svc        Diet: Regular Diet   Special Instructions:  -No driving or operating heavy machinery until cleared by provider  -No smoking or alcohol  or illicit drug use     Allergies as of  10/08/2024       Reactions   Genvoya [elviteg-cobic-emtricit-tenofaf] Hives   Lisinopril Cough   Aldactone  [spironolactone ] Hives   Valium  [diazepam ] Other (See Comments)   Lethargy (pt states she cannot take this again)   Tegaderm Ag Mesh [silver] Itching        Medication List     STOP taking these medications    Auryxia  1 GM 210 MG(Fe) tablet Generic drug: ferric citrate    minocycline  100 MG capsule Commonly known as: Minocin    tiZANidine  4 MG tablet Commonly known as: Zanaflex   TAKE these medications    acetaminophen  325 MG tablet Commonly known as: TYLENOL  Take 1-2 tablets (325-650 mg total) by mouth every 4 (four) hours as needed for mild pain (pain score 1-3). What changed:  medication strength how much to take when to take this   allopurinol  100 MG tablet Commonly known as: ZYLOPRIM  TAKE 1 TABLET (100 MG TOTAL) BY MOUTH DAILY. FOR GOUT PREVENTION   Aspirin  Low Dose 81 MG chewable tablet Generic drug: aspirin  Chew 1 tablet (81 mg total) by mouth daily.   busPIRone  5 MG tablet Commonly known as: BUSPAR  Take 2 tablets (10 mg total) by mouth 2 (two) times daily. What changed: See the new instructions.   calcitRIOL  0.25 MCG capsule Commonly known as: ROCALTROL  Take 0.25 mcg by mouth 3 (three) times a week.   Darbepoetin Alfa  100 MCG/0.5ML Sosy injection Commonly known as: ARANESP  Inject 0.5 mLs (100 mcg total) into the skin every Monday at 6 PM.   dolutegravir  50 MG tablet Commonly known as: [name]  Take 1 tablet (50 mg total) by mouth daily.   emtricitabine -tenofovir  AF 200-25 MG tablet Commonly known as: DESCOVY  Take 1 tablet by mouth daily.   feeding supplement Liqd Take 237 mLs by mouth 2 (two) times daily between meals.   fludrocortisone  0.1 MG tablet Commonly known as: FLORINEF  Take 1 tablet (0.1 mg total) by mouth daily.   fluticasone  110 MCG/ACT inhaler Commonly known as: FLOVENT  HFA Inhale 1 puff into the lungs 2 (two)  times daily.   Heartburn Relief 10 MG tablet Generic drug: famotidine  Take 1 tablet (10 mg total) by mouth daily.   hydrocortisone  5 MG tablet Commonly known as: CORTEF  Take 3 tablets (15 mg total) by mouth 2 (two) times daily. What changed:  medication strength See the new instructions.   levocetirizine 5 MG tablet Commonly known as: XYZAL  TAKE 1 TABLET BY MOUTH EVERY DAY IN THE EVENING FOR ALLERGIES   levothyroxine  150 MCG tablet Commonly known as: SYNTHROID  TAKE 1 TABLET BY MOUTH EVERY DAY BEFORE BREAKFAST   lidocaine  5 % Commonly known as: LIDODERM  PLACE 1 PATCH ONTO THE SKIN DAILY. REMOVE & DISCARD PATCH WITHIN 12 HOURS OR AS DIRECTED BY MD What changed:  when to take this reasons to take this   metoprolol  succinate 25 MG 24 hr tablet Commonly known as: TOPROL -XL Take 0.5 tablets (12.5 mg total) by mouth daily.   mirtazapine  7.5 MG tablet Commonly known as: REMERON  Take 1 tablet (7.5 mg total) by mouth at bedtime.   multivitamin Tabs tablet Take 1 tablet by mouth at bedtime.   omeprazole  40 MG capsule Commonly known as: PRILOSEC Take 1 capsule (40 mg total) by mouth 2 (two) times daily. for heartburn.   ondansetron  4 MG disintegrating tablet Commonly known as: ZOFRAN -ODT Take 1 tablet (4 mg total) by mouth every 8 (eight) hours as needed for nausea or vomiting.   onetouch ultrasoft lancets Use as instructed to test blood sugar daily   oxyCODONE  5 MG immediate release tablet Commonly known as: Oxy IR/ROXICODONE  Take 1 tablet (5 mg total) by mouth every 6 (six) hours as needed for moderate pain (pain score 4-6).   rosuvastatin  10 MG tablet Commonly known as: CRESTOR  TAKE 1 TABLET BY MOUTH EVERY DAY FOR CHOLESTEROL   saccharomyces boulardii 250 MG capsule Commonly known as: FLORASTOR Take 1 capsule (250 mg total) by mouth daily.   sevelamer  carbonate 800 MG tablet Commonly known as: RENVELA  Take 2 tablets (1,600 mg total) by mouth 3 (three)  times  daily with meals. What changed: how much to take   traZODone  50 MG tablet Commonly known as: DESYREL  Take 0.5 tablets (25 mg total) by mouth at bedtime as needed for sleep.        Follow-up Information     Gretta Comer POUR, NP Follow up.   Specialty: Internal Medicine Why: Call for an hospital follow-up appointment within 1 to 2 weeks. Contact information: [address] [place] [address] [phone]         Tobie Gordy POUR, MD Follow up.   Specialties: Nephrology, Vascular Surgery Why: Call for an appointment. Contact information: [address] [place] [address] [phone]                 Signed: Daphne LOISE Satterfield 10/07/2024, 12:10 PM

## 2024-10-08 ENCOUNTER — Other Ambulatory Visit (HOSPITAL_COMMUNITY): Payer: Self-pay

## 2024-10-08 LAB — GLUCOSE, CAPILLARY: Glucose-Capillary: 102 mg/dL — ABNORMAL HIGH (ref 70–99)

## 2024-10-08 NOTE — Progress Notes (Signed)
 Contacted home therapy RN at [facility]. RN aware pt will d/c today and should resume PD at d/c.   Randine Mungo Dialysis Navigator [phone]

## 2024-10-08 NOTE — Progress Notes (Signed)
 "                                                        PROGRESS NOTE   Subjective/Complaints: No new complaints this AM. Looking forward to DC home.   LBM yesterday  ROS:  Pt denies fevers, chills, SOB, abd pain, CP, N/V/C/D, and vision changes, new motor or sensory changes +decreased appetite +Back pain controlled   Objective:   No results found.  Recent Labs    10/06/24 0645  WBC 12.2*  HGB 9.5*  HCT 28.5*  PLT 223   Recent Labs    10/07/24 0533  NA 136  K 4.2  CL 95*  CO2 23  GLUCOSE 133*  BUN 55*  CREATININE 11.10*  CALCIUM  8.8*    Intake/Output Summary (Last 24 hours) at 10/08/2024 1016 Last data filed at 10/08/2024 0745 Gross per 24 hour  Intake --  Output 699 ml  Net -699 ml        Physical Exam: Vital Signs Blood pressure 138/70, pulse 89, temperature 97.8 F (36.6 C), resp. rate 16, height 5' 5 (1.651 m), weight 76.6 kg, last menstrual period 03/31/2014, SpO2 100%. General: awake, alert, appropriate, sitting edge of bed HENT: conjugate gaze; oropharynx moist CV: regular rate and rhythm; no JVD Pulmonary: CTA B/L; non-labored breathing GI: soft, NT, ND, (+)BS- PD catheter in place Psychiatric: appropriate- interactive Neurological: Ox3, follows commands  PRIOR EXAMS: Neurological: Ox3 Skin: PD site clean, dressed. A few scattered bruises R neck CVL out-  Neuro:   Alert and awake. Normal insight and awareness. Normal language and speech. Cranial nerve exam unremarkable. Moving all 4 extremities to gravity and resistance. Stocking glove sensory loss below ankles and to a lesser extent in both hands.  No abnl resting tone.    Assessment/Plan: 1. Functional deficits which require 3+ hours per day of interdisciplinary therapy in a comprehensive inpatient rehab setting. Physiatrist is providing close team supervision and 24 hour management of active medical problems listed below. Physiatrist and rehab team continue to assess barriers to  discharge/monitor patient progress toward functional and medical goals  Care Tool:  Bathing    Body parts bathed by patient: Right arm, Left arm, Chest, Abdomen, Front perineal area, Buttocks, Right upper leg, Left upper leg, Face, Right lower leg, Left lower leg   Body parts bathed by helper: Left lower leg, Right lower leg     Bathing assist Assist Level: Independent with assistive device     Upper Body Dressing/Undressing Upper body dressing   What is the patient wearing?: Pull over shirt    Upper body assist Assist Level: Independent    Lower Body Dressing/Undressing Lower body dressing      What is the patient wearing?: Pants     Lower body assist Assist for lower body dressing: Independent with assitive device Assistive Device Comment: rest breaks due to fatigue   Toileting Toileting    Toileting assist Assist for toileting: Independent with assistive device Assistive Device Comment: raised toilet   Transfers Chair/bed transfer  Transfers assist     Chair/bed transfer assist level: Independent with assistive device Chair/bed transfer assistive device: Geologist, Engineering   Ambulation assist      Assist level: Independent with assistive device Assistive device: Walker-rolling Max distance: 160   Walk  10 feet activity   Assist     Assist level: Independent with assistive device Assistive device: Walker-rolling   Walk 50 feet activity   Assist Walk 50 feet with 2 turns activity did not occur: Safety/medical concerns  Assist level: Independent with assistive device Assistive device: Walker-rolling    Walk 150 feet activity   Assist Walk 150 feet activity did not occur: Safety/medical concerns  Assist level: Independent with assistive device Assistive device: Walker-rolling    Walk 10 feet on uneven surface  activity   Assist Walk 10 feet on uneven surfaces activity did not occur: Safety/medical concerns   Assist  level: Supervision/Verbal cueing Assistive device: Walker-rolling   Wheelchair     Assist Is the patient using a wheelchair?: No Type of Wheelchair: Manual    Wheelchair assist level: Dependent - Patient 0% Max wheelchair distance: 300 ft    Wheelchair 50 feet with 2 turns activity    Assist        Assist Level: Dependent - Patient 0%   Wheelchair 150 feet activity     Assist      Assist Level: Dependent - Patient 0%   Blood pressure 138/70, pulse 89, temperature 97.8 F (36.6 C), resp. rate 16, height 5' 5 (1.651 m), weight 76.6 kg, last menstrual period 03/31/2014, SpO2 100%.   Medical Problem List and Plan: 1. Functional deficits secondary to debility related to circulatory shock, adrenal insufficiency/pituitary apoplexy, new CHF             -patient may  shower             -ELOS/Goals: 7-10 days, mod I goals.              -family is looking at some options for intermittent assistance at home  - Head CT completed last night 12/13 when she bumped her head, no acute abnormalities   D/C 12/23  Con't CIR PT and OT  WBC down to 12.2  DC home today  2.  Antithrombotics: -DVT/anticoagulation:  Mechanical: Sequential compression devices, below knee Bilateral lower extremities Pharmaceutical: Heparin   12/16- Heparin  held due to dropping Hb             -antiplatelet therapy: Aspirin     3. Pain Management:  Tylenol  and Oxycodone  prn.   12/16- denies pain this AM- con't regimen 4. Mood/Behavior/Sleep: LCSW to follow for evaluation and support when available.              -antipsychotic agents: Buspar  10 mg BID prn for anxiety and Remeron  7.5 mg at bedtime.              -pt seems positive. Family very supportive  12/18- poor sleep- added Melatonin and has Trazodone  25 mg prn if need be  12/19 slept great with 25 mg Trazodone  last night- con't prn 5. Neuropsych/cognition: This patient is capable of making decisions on her own behalf.   6. Skin/Wound Care:  Routine pressure relief measures.   12/17- Pulled CVL since appeared like getting infected 7. Fluids/Electrolytes/Nutrition: Monitor I&O and weight. Follow up labs CBC/CMP               -GERD: Omeprazole  40 mg bid    8. 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    12/18- changed to PO to get rid of CVL- also could be cause of leukocytosis?  12/19- WBC doing better  9.ESRD: Continue nightly PD per nephrology.              -  pt was independent with management of her PD at home  -12/14, discussed Renvela  with nephrology, they plan to resume  12/16- K+ 3.2- per renal-   12/18-12/19-  K+ still  3.7  12/23 K+ 4.2  Reviewed renal note  10.Hx of papillary thyroid  cancer s/p thyroidectomy: TSH normal, continue Synthroid .    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    12. HFrEF, without acute exacerbation: Nephrology to manage volume status. Cardiology following. Continue heart healthy diet and GDMT as tolerated. Daily weights-appears stable  12/18- Weight 77.7 kg- doing well  12/19- WBC 78.7 kg- overall stable  12/22 wt appears stable  Filed Weights   10/04/24 0548 10/06/24 0532 10/07/24 0500  Weight: 78.7 kg 77.5 kg 76.6 kg      13. Low back pain/Abdominal pain: present since aortic dissection 11/24. Continue to monitor. MRI T and L spine negative, RUQ US  negative for acute findings.              -continue Lidocaine  patch              -pt is able to work through discomfort  12/16- Been having abd pain/back pain for 2 weeks- CT didn't show any acute pathology- did show Cirrhosis, cholelithiasis and B/L renal stones.   14. HTN: Monitor BP per protocol, continue Metoprolol   12/22-23 BP stable, continue current      10/08/2024    6:21 AM 10/07/2024    8:03 PM 10/07/2024    1:22 PM  Vitals with BMI  Systolic 138 135 841  Diastolic 70 71 76  Pulse 89 84 82     15. HLD: continue Crestor  10 mg    16. HIV: CD4 445 on 10/15. Continue home [name]  and Descovy               12/16- will call ID about rising WBC- esp in light of HIV  12/17- d/w ID- they feel HIV is not contributing to issues  17. T2DM: Monitor glucose ac/hs with SSI.              -associated peripheral neuropathy  Highest CBG 108- will d/c HS correction scale  12/22 CBG elevated this AM, monitor trend  Stable overall, continue current    -CBG (last 3)  Recent Labs    10/07/24 1747 10/07/24 2002 10/08/24 0622  GLUCAP 104* 86 102*   18. Chronic nausea/poor appetite  -12/13 Ate 50%, continue monitor intake  -Continue Remaron  12/17- Still poor appetite- but taking supplements when needed  12/19- eating a little better per pt  19. Diarrhea  -12/13 LBM recorded yesterday type 4, medium, continue to monitor   -pt interested in trying probiotics, will start  12/16- Denies diarrhea- LBM yesterday  12/17- Pt has had 2 formed and 2 mushy stools in last 5 days- LBM 2 days ago- on enteric precautions since was found to have C diff (-) in 11/25- pending C diff when pt has BM  12/18- off C diff enteric precautions- No Sx's of C diff- also LBM formed yesterday evening  12/19- Diarrhea resolved- LBM 2 days ago   LBM 12/21 20. Leukocytosis  12/15- WBC 15.3 up from 13k -will check if voids at all- and order U/A and Cx if so and check CXR- will also order labs for AM- and  might need to remove CVL in R neck-   12/16- got Blood C'x- so far, nothing back yet- WBC up  somewhat to 15.8- from 15.3- will call ID- is anuric, so don't have source there and CXR was (-) for acute issues. - spoke to ID- they don't feel it's worrisome, but keep an eye on Cx's- and manage if clinically gets worse. 12/17- Pulled CVL on R neck- wasn't looking well- Cultures so far 2 days (-)  12/18- Cultures day 3 (-) and peritoneal fluid has no WBCs or organisms so far- done 3am today 12/19- Cx pending on peritoneal  fluids, but no WBCs or organisms- WBC down to 14.8, so trending down- happy to see this- again, asymptomatic -con't to monitor Cx reviewed and appears wnl. WBC reviewed and is downtrendingg 12/22 WBC down to 12.2 yesterday  21. Acute on chronic anemia  12/15- Due to ESRD- but also dropped almost 1 unit since Saturday- which is 1 unit lower than average last 1 week. Will check CT of abd/pelvis and hemoccult- also reached out to Renal about this.   12/16- CT was (-) for acute pathology that would explain- Hemoccult (+)- stopped Heparin  SQ- and GI called- will check into this.  Also Hb up somewhat to 8.1 today  12/17- GI wants C diff checked- with PCR- could be colitis? Don't feel colonoscopy will add much they will consider colonoscopy if Hb continues to drift down with no other source  12/18- Hb 7.8 today- has dropped a little- hemoccult (+) x2- but also has hemorrhoids it appears-will con't to check daily and make sure doesn't drop more.    12/19- HB 7.7- stable for 2 days- will con't to monitor  12/21: Hgb reviewed and is improved  22) Metallic taste in her mouth: requested pharmacy to please review her medications to see if any have metallic taste as a side effect- melatonin, buspar , and protonix  were identified, requested nursing to please discuss with patient if she would like any of these stopped and she would like melatonin d/ced  23) Decreased appetite: consulted nephrology regarding whether IVF can be started and they would like to assess patient first and she complained of puffiness yesterday   11 days A FACE TO FACE EVALUATION WAS PERFORMED  Murray Collier 10/08/2024, 10:16 AM     "

## 2024-10-08 NOTE — Progress Notes (Signed)
 Inpatient Rehabilitation Discharge Medication Review by a Pharmacist  A complete drug regimen review was completed for this patient to identify any potential clinically significant medication issues.  High Risk Drug Classes Is patient taking? Indication by Medication  Antipsychotic No   Anticoagulant No   Antibiotic No   Opioid Yes Oxycodone : pain  Antiplatelet Yes Aspirin : NSTEMI  Hypoglycemics/insulin  No   Vasoactive Medication Yes Metoprolol : HTN, HF  Chemotherapy No   Other Yes Tylenol , Lidoderm : pain Allopurinol : elevated uric acid Dolutegravir , Descovy : HIV Crestor : HLD Buspirone : anxiety Remeron : sleep/mood Trazodone : sleep Levothyroxine : hypothyroid Pepcid , prilosec: GERD Zofran : N/V Renvela : CKD Aranesp : anemia of chronic disease Calcitriol : secondary hyperparathyroid Florinef , hydrocortisone : adrenal insufficiency Xyzal , Flovent : allergies     Type of Medication Issue Identified Description of Issue Recommendation(s)  Drug Interaction(s) (clinically significant)     Duplicate Therapy     Allergy     No Medication Administration End Date     Incorrect Dose     Additional Drug Therapy Needed     Significant med changes from prior encounter (inform family/care partners about these prior to discharge). Home meds not ordered or substituted this admission: -Auryxia  -Minocycline  Restart or discontinue as appropriate. Communicate medication changes with patient/family at discharge  Other       Clinically significant medication issues were identified that warrant physician communication and completion of prescribed/recommended actions by midnight of the next day:  No    Time spent performing this drug regimen review (minutes): 30   Thank you for allowing pharmacy to be a part of this patients care.    Benedetta Heath BS, PharmD, BCPS Clinical Pharmacist 10/08/2024 7:09 AM  Contact: [phone] after 3 PM

## 2024-10-08 NOTE — Progress Notes (Signed)
 " [facility] Progress Note   Subjective:   Seen in room - she is walking around, getting organized. Plan remains for d/c home today. No CP/dyspnea. PD fine overnight - off.  Objective Vitals:   10/07/24 1322 10/07/24 2003 10/08/24 0621 10/08/24 0812  BP: (!) 158/76 135/71 138/70   Pulse: 82 84 89   Resp: 17 16 16    Temp: 97.8 F (36.6 C) 97.8 F (36.6 C)    TempSrc: Oral  Oral   SpO2: 100% 100% 100% 100%  Weight:      Height:       Physical Exam General: Well appearing, NAD. Room air Heart: RRR Lungs: CTAB Abdomen: soft, non-tender. PD cath in L abd Extremities: no LE edema Dialysis Access:  PD cath in L mid abd  Additional Objective Labs: Basic Metabolic Panel: Recent Labs  Lab 10/02/24 0949 10/03/24 0507 10/04/24 0606 10/07/24 0533  NA  --  136 136 136  K  --  3.7 3.7 4.2  CL  --  94* 94* 95*  CO2  --  25 23 23   GLUCOSE  --  94 96 133*  BUN  --  57* 55* 55*  CREATININE  --  11.70* 11.70* 11.10*  CALCIUM   --  8.8* 8.7* 8.8*  PHOS 6.8*  --  5.9*  --    Liver Function Tests: Recent Labs  Lab 10/04/24 0606  ALBUMIN  3.1*   CBC: Recent Labs  Lab 10/02/24 0949 10/03/24 0507 10/04/24 0606 10/05/24 0535 10/06/24 0645  WBC 17.6* 17.5* 14.8* 14.6* 12.2*  HGB 8.2* 7.8* 7.7* 9.0* 9.5*  HCT 24.1* 22.7* 22.5* 26.3* 28.5*  MCV 107.6* 106.1* 106.6* 108.7* 109.2*  PLT 212 225 235 274 223   Medications:  dialysis solution 1.5% low-MG/low-CA     dialysis solution 2.5% low-MG/low-CA      allopurinol   100 mg Oral Daily   aspirin   81 mg Oral Daily   budesonide  (PULMICORT ) nebulizer solution  0.25 mg Nebulization BID   busPIRone   10 mg Oral BID   calcitRIOL   0.25 mcg Oral Once per day on Monday Wednesday Friday   darbepoetin (ARANESP ) injection - DIALYSIS  100 mcg Subcutaneous Q Mon-1800   dolutegravir   50 mg Oral Daily   emtricitabine -tenofovir  AF  1 tablet Oral Daily   famotidine   10 mg Oral Daily   feeding supplement  237 mL Oral BID  BM   fludrocortisone   0.1 mg Oral Daily   gentamicin  cream  1 Application Topical Daily   hydrocortisone   15 mg Oral BID   insulin  aspart  0-6 Units Subcutaneous TID WC   levothyroxine   150 mcg Oral Q0600   lidocaine   1 patch Transdermal Q24H   loratadine   10 mg Oral QHS   metoprolol  succinate  12.5 mg Oral Daily   mirtazapine   7.5 mg Oral QHS   multivitamin  1 tablet Oral QHS   nystatin   5 mL Oral QID   pantoprazole   40 mg Oral Daily   rosuvastatin   10 mg Oral Daily   saccharomyces boulardii  250 mg Oral Daily   sevelamer  carbonate  1,600 mg Oral TID WC    Dialysis Orders [facility] - [place] 7d/week, 5 exchanges, fill vol , 1.5hr dwell time, EDW 74.5kg - Typically 1/2 1.5%, 1/2 2.5% dextrose    Assessment/Plan: Debility: In CIR. Discharge planned for today. ESRD: Continue PD nightly. Volume good, continue current regimen.  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 Leukocytosis: Likely due to steroids, PD fluid  cell count normal - no peritonitis. HTN/volume: BP stable, no edema - continue same meds. Anemia of ESRD: Hgb improving, continue Aranesp  q Monday. FOBT positive. GI consulted, no plan for scope. Secondary HPTH: CorrCa ok, Phos high but improving - continue binders + VDRA. Nutrition: Alb low, on supps. HIV Hypothyroidism Dispo: Outpatient PD RN aware of her discharge. Pt tells me that she spoke to her yesterday. Will resume home PD with the help of her family.   Izetta Boehringer, PA-C 10/08/2024, 9:04 AM  [facility]    "

## 2024-10-13 ENCOUNTER — Other Ambulatory Visit: Payer: Self-pay | Admitting: Internal Medicine

## 2024-10-14 NOTE — Telephone Encounter (Signed)
 Do not see that form was received yet. Pt has HFU with [facility] 10/23/24.   Lvm asking pt to call back. Pls notify pt we haven't received form but she can bring with her to 10/24/23 OV.

## 2024-10-15 NOTE — Telephone Encounter (Signed)
 Do not see that form was received yet. Pt has HFU with [facility] 10/23/24.   Lvm asking pt to call back. Pls notify pt we haven't received form but she can bring with her to 10/24/23 OV.

## 2024-10-17 ENCOUNTER — Other Ambulatory Visit: Payer: Self-pay | Admitting: Primary Care

## 2024-10-17 DIAGNOSIS — E1169 Type 2 diabetes mellitus with other specified complication: Secondary | ICD-10-CM

## 2024-10-23 ENCOUNTER — Ambulatory Visit: Payer: Self-pay | Admitting: Primary Care

## 2024-10-23 ENCOUNTER — Encounter: Payer: Self-pay | Admitting: Primary Care

## 2024-10-23 ENCOUNTER — Ambulatory Visit (INDEPENDENT_AMBULATORY_CARE_PROVIDER_SITE_OTHER)
Admission: RE | Admit: 2024-10-23 | Discharge: 2024-10-23 | Disposition: A | Discharge status: Home / Self Care | Source: Ambulatory Visit | Attending: Primary Care | Admitting: Primary Care

## 2024-10-23 ENCOUNTER — Ambulatory Visit: Admitting: Primary Care

## 2024-10-23 VITALS — BP 112/64 | HR 85 | Temp 97.8°F | Ht 65.0 in | Wt 167.0 lb

## 2024-10-23 DIAGNOSIS — R0789 Other chest pain: Secondary | ICD-10-CM

## 2024-10-23 DIAGNOSIS — R634 Abnormal weight loss: Secondary | ICD-10-CM

## 2024-10-23 DIAGNOSIS — E861 Hypovolemia: Secondary | ICD-10-CM

## 2024-10-23 DIAGNOSIS — E440 Moderate protein-calorie malnutrition: Secondary | ICD-10-CM | POA: Diagnosis not present

## 2024-10-23 DIAGNOSIS — E039 Hypothyroidism, unspecified: Secondary | ICD-10-CM | POA: Diagnosis not present

## 2024-10-23 DIAGNOSIS — R630 Anorexia: Secondary | ICD-10-CM

## 2024-10-23 DIAGNOSIS — N186 End stage renal disease: Secondary | ICD-10-CM

## 2024-10-23 DIAGNOSIS — K668 Other specified disorders of peritoneum: Secondary | ICD-10-CM

## 2024-10-23 LAB — T4, FREE: Free T4: 1.04 ng/dL (ref 0.60–1.60)

## 2024-10-23 LAB — CBC
HCT: 33.1 % — ABNORMAL LOW (ref 36.0–46.0)
Hemoglobin: 10.9 g/dL — ABNORMAL LOW (ref 12.0–15.0)
MCHC: 33 g/dL (ref 30.0–36.0)
MCV: 110.6 fl — ABNORMAL HIGH (ref 78.0–100.0)
Platelets: 223 K/uL (ref 150.0–400.0)
RBC: 2.99 Mil/uL — ABNORMAL LOW (ref 3.87–5.11)
RDW: 16.1 % — ABNORMAL HIGH (ref 11.5–15.5)
WBC: 4.2 K/uL (ref 4.0–10.5)

## 2024-10-23 LAB — TSH: TSH: 1.21 u[IU]/mL (ref 0.35–5.50)

## 2024-10-23 NOTE — Telephone Encounter (Signed)
 Was able to reach out to sister who contacted me with patient. Information given. She will reach out if no call to set up ct by end of day. I have sent message to stat pool as well letting them know order in.

## 2024-10-23 NOTE — Telephone Encounter (Signed)
 Called patient left message to call office when calls back needs to talk to someone in office.

## 2024-10-23 NOTE — Assessment & Plan Note (Signed)
 Encourage patient to continue mirtazapine  7.5 mg at bedtime. We also discussed today dose adjustment if necessary. She will update early next week.

## 2024-10-23 NOTE — Telephone Encounter (Signed)
 Received faxed Accessible Collection Service Application. [name] completed and signed form.   [Mailed original to pt. Made copy to scan.]  Lvm asking pt to call back. Need to relay info above concerning form.

## 2024-10-23 NOTE — Patient Instructions (Addendum)
 Stop taking metoprolol  succinate for blood pressure and heart rate.  Stop taking saxagliptin  medication for diabetes.  Continue taking mirtazapine  7.5 mg at bedtime for appetite stimulation.  You will receive a phone call regarding the case manager nurse.  Stop by the lab and xray prior to leaving today. I will notify you of your results once received.   Please ask your kidney doctor about the Aranesp  medication that was discussed during your hospitalization.  Please also discuss hydrocortisone  and fludrocortisone  with your endocrinologist.  I will post a my chart letter for your trach pick up.  It was a pleasure to see you today!

## 2024-10-23 NOTE — Assessment & Plan Note (Addendum)
 Secondary to poor oral intake.  Continue mirtazapine  7.5 mg at bedtime. We discussed that a dose increase may be necessary in the future. She will update.  Referral placed to [facility] for chronic care management.

## 2024-10-23 NOTE — Assessment & Plan Note (Signed)
 Well-controlled compared to labs from December 2025. Will repeat TSH and add free T4

## 2024-10-23 NOTE — Assessment & Plan Note (Signed)
 Likely due to lack of oral intake coupled with dialysis.  Remain off metoprolol  succinate 12.5 mg for now. Continue mirtazapine  7.5 mg daily.

## 2024-10-23 NOTE — Progress Notes (Signed)
 "  Subjective:    Patient ID: [name], female    DOB: 06/14/1966, 59 y.o.   MRN: [id number]  [name] is a very pleasant 59 y.o. female with a send medical history including hypertension, type 2 diabetes, papillary adenocarcinoma of thyroid , HIV, breast cancer, end-stage renal disease on peritoneal dialysis, aortic dissection with repair, hidradenitis suppurativa who presents today for hospital follow-up.  Admitted to [facility] services 09/23/2024 to 09/27/2024 for worsening back and abdominal pain with dyspnea found to be profoundly hypotensive.  She was treated with IV fluids and antibiotics that she was suspected to be septic.  Fortunately, infectious workup was negative.  CTA chest abdomen pelvis revealed stable thoracoabdominal aortic aneurysm and dissection without significant change.  She was noted to have persistent pneumoperitoneum and pelvic free fluid in the setting of peritoneal dialysis catheter.  She was also noted to have a left bundle branch block with PACs, elevated troponins ranging 331-392.  Electrolyte imbalances included hyponatremia.  MRI of the lumbar and thoracic spine was unrevealing for any acute findings.  She underwent central line placement for IV hydration.  Nephrology consulted who suspected adrenal insufficiency because so she was treated accordingly.  She underwent echocardiogram which showed decreased LVEF 30 to 35% with moderate to severe mitral valve regurgitation.  She underwent left and right heart catheterizations with normal findings.  Vascular surgery also consulted, no interventions recommended.  She was discharged on 09/27/2024 and admitted to inpatient rehab services for ongoing monitoring.  She was found to be anemic with Hemoccult stool card positive x 2.  Abdominal ultrasound and CT without acute pathology but with cirrhosis, cholelithiasis, bilateral renal stones.  She was initiated on Remeron  for appetite stimulation.  GI  consulted who recommended she discontinue heparin  subcu, colonoscopy not indicated at the time.  Hemoglobin improved from 7.8-9.6 at discharge.  She was initiated on Aranesp  per nephrology for outpatient management.  She was discharged home on 10/08/2024. She's not eating as she continues to have a decreased appetite and metallic taste in her mouth.  Food does not taste good. She continues to drink Ensure.  She is worried about being alone in her home without the ability to eat.  She questions if she needs to be admitted somewhere to find out why she is not eating.  She took her first dose of mirtazapine  last night.  Her sister is checking her BP readings which are running 90-low100s/60-70.  She has not taken metoprolol  in at least 1 week. She underwent medication reconciliation yesterday her nephrology and they questioned the saxagliptin  and metoprolol . She is visited by home health physical therapy and nursing.   She fell on 10/11/24 due to weakness, she hit under her left breast with the arm of a chair.  Since then she has experienced pain under her left breast.   Both she and her daughter are unaware of the Aranesp , fludrocortisone , and hydrocortisone  medications.  She has an appointment scheduled endocrinology on 11/05/2024 but questions this as she already has an endocrinologist through [facility].  She was told in the hospital that her thyroid  function could be causing her inability to eat.  Last TSH was 1.5 on 09/23/2024.   Wt Readings from Last 3 Encounters:  10/23/24 167 lb (75.8 kg)  10/07/24 168 lb 14 oz (76.6 kg)  09/27/24 172 lb 3.2 oz (78.1 kg)     Review of Systems  Constitutional:  Positive for appetite change and fatigue.  Respiratory:  Negative for  shortness of breath.   Cardiovascular:  Negative for chest pain.  Musculoskeletal:  Positive for back pain.  Psychiatric/Behavioral:  The patient is not nervous/anxious.          Past Medical History:  Diagnosis Date   Acute pain  of right shoulder due to trauma 06/08/2023   Acute pancreatitis 10/23/2021   Acute renal failure superimposed on stage 4 chronic kidney disease (HCC) 10/24/2021   Allergy    Anemia    Normocytic   Antibiotic-induced yeast infection 09/28/2020   Anxiety    Asthma    Bronchitis 2005   Bursitis of left shoulder 09/06/2019   CKD (chronic kidney disease)    Dialysis on Mon-Wed- Fri   CLASS 1-EXOPHTHALMOS-THYROTOXIC 02/08/2007   Cognitive dysfunction 02/21/2020   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  Genes Analyzed (43 total): APC*, ATM*, AXIN2, BARD1, BMPR1A, BRCA1*, BRCA2*, BRIP1*, CDH1*, CDK4, CDKN2A, CHEK2*, DICER1, GALNT12, HOXB13, MEN1, MLH1*, MRE11A, MSH2*, MSH3, MSH6*, MUTYH*, NBN, NF1*, NTHL1, PALB2*, PMS2*, POLD1, POLE, PTEN*, RAD50, RAD51C*, RAD51D*, RET, SDHB, SDHD, SMAD4, SMARCA4, STK11 and TP53* (sequencing and deletion/duplication); EGFR (s   GERD 07/24/2006   GRAVE'S DISEASE 01/01/2008   History of hidradenitis suppurativa    History of kidney stones    History of thrush    HIV DISEASE 07/24/2006   dx March 05   Hyperlipidemia    HYPERTENSION 07/24/2006   Hyperthyroidism 08/2006   Grave's Disease -diffuse radiotracer uptake 08/25/06 Thyroid  scan-Cold nodule to R lower lobe of thyrorid   Ileus (HCC) 02/14/2023   Left bundle branch block (LBBB)    Malnutrition of moderate degree  02/08/2023   Menometrorrhagia    hx of   Nephrolithiasis    Panniculitis 05/12/2020   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   Peripheral vascular disease    Personal history of chemotherapy    2020   Personal history of radiation therapy    2020   Pneumonia 2005   Port-A-Cath in place 07/12/2018   Postsurgical hypothyroidism 03/20/2011   Rash 02/16/2012   Recurrent boils 06/11/2014   Recurrent falls 11/05/2020   Renal calculi 10/29/2014   Sarcoidosis 02/08/2007   dx as a teenager in [place] from abnl CXR. Completed 2 yrs Prednisone  after lung bx confirmation. No symptoms since then.   Sebaceous cyst 04/21/2020   Suppurative hidradenitis    Thyroid  cancer (HCC)    THYROID  NODULE, RIGHT 02/08/2007    Social History   Socioeconomic History   Marital status: Single    Spouse name: Not on file   Number of children: 1   Years of education: Not on file   Highest education level: Not on file  Occupational History   Occupation: Investment Banker, Corporate  Tobacco Use   Smoking  status: Former    Current packs/day: 0.00    Average packs/day: 0.5 packs/day for 15.0 years (7.5 ttl pk-yrs)    Types: Cigarettes    Start date: 04/12/2017    Quit date: 2019    Years since quitting: 7.0   Smokeless tobacco: Never  Vaping Use   Vaping status: Never Used  Substance and Sexual Activity   Alcohol  use: Not Currently    Comment: occasional wine   Drug use: No   Sexual activity: Not Currently    Birth control/protection: Post-menopausal  Other Topics Concern   Not on file  Social History Narrative   Right Handed    Lives in a two story home   Social Drivers of Health   Tobacco Use: Medium Risk (10/23/2024)   Patient History    Smoking Tobacco Use: Former    Smokeless Tobacco Use: Never    Passive  Exposure: Not on file  Financial Resource Strain: Medium Risk (06/27/2024)   Overall Financial Resource Strain (CARDIA)    Difficulty of Paying Living Expenses: Somewhat hard  Food Insecurity: No Food Insecurity (09/26/2024)   Epic    Worried About Programme Researcher, Broadcasting/film/video in the Last Year: Never true    Ran Out of Food in the Last Year: Never true  Transportation Needs: No Transportation Needs (09/26/2024)   Epic    Lack of Transportation (Medical): No    Lack of Transportation (Non-Medical): No  Physical Activity: Inactive (06/27/2024)   Exercise Vital Sign    Days of Exercise per Week: 0 days    Minutes of Exercise per Session: 0 min  Stress: Stress Concern Present (06/27/2024)   [employer] of Occupational Health - Occupational Stress Questionnaire    Feeling of Stress: To some extent  Social Connections: Unknown (06/27/2024)   Social Connection and Isolation Panel    Frequency of Communication with Friends and Family: More than three times a week    Frequency of Social Gatherings with Friends and Family: Three times a week    Attends Religious Services: More than 4 times per year    Active Member of Clubs or Organizations: No    Attends Banker Meetings: Never    Marital Status: Not on file  Intimate Partner Violence: Not At Risk (09/26/2024)   Epic    Fear of Current or Ex-Partner: No    Emotionally Abused: No    Physically Abused: No    Sexually Abused: No  Depression (PHQ2-9): Medium Risk (10/23/2024)   Depression (PHQ2-9)    PHQ-2 Score: 7  Alcohol  Screen: Low Risk (06/27/2024)   Alcohol  Screen    Last Alcohol  Screening Score (AUDIT): 0  Housing: Low Risk (09/26/2024)   Epic    Unable to Pay for Housing in the Last Year: No    Number of Times Moved in the Last Year: 0    Homeless in the Last Year: No  Utilities: Not At Risk (09/26/2024)   Epic    Threatened with loss of utilities: No  Health Literacy: Adequate Health Literacy (06/27/2024)   B1300 Health  Literacy    Frequency of need for help with medical instructions: Never    Past Surgical History:  Procedure Laterality Date   AORTIC ARCH DEBRANCHING N/A 08/25/2023   Procedure: AORTIC ARCH DEBRANCHING USING 12X8X8MM HEMASHIELD GRAFT;  Surgeon: Lucas Dorise POUR, MD;  Location: [facility];  Service: Open Heart Surgery;  Laterality: N/A;  with bypasses to the innominate and left common carotid arteries  APPLICATION OF WOUND VAC N/A 01/20/2021   Procedure: APPLICATION OF WOUND VAC;  Surgeon: Lowery Estefana RAMAN, DO;  Location: [facility];  Service: Plastics;  Laterality: N/A;   BREAST EXCISIONAL BIOPSY Right 04/26/2018   right axilla negative   BREAST EXCISIONAL BIOPSY Left 04/26/2018   left axilla negative   BREAST LUMPECTOMY Right 10/03/2018   malignant   BREAST LUMPECTOMY WITH RADIOACTIVE SEED AND SENTINEL LYMPH NODE BIOPSY Right 10/03/2018   Procedure: RIGHT BREAST LUMPECTOMY WITH RADIOACTIVE SEED AND SENTINEL LYMPH NODE MAPPING;  Surgeon: Vanderbilt Ned, MD;  Location: [facility];  Service: General;  Laterality: Right;   BREAST SURGERY  1997   Breast Reduction    CAPD INSERTION N/A 12/21/2023   Procedure: LAPAROSCOPIC INSERTION CONTINUOUS AMBULATORY PERITONEAL DIALYSIS  (CAPD) CATHETER; LAPAROSCOPIC OMENTUMPEXY;  Surgeon: Magda Ned SAILOR, MD;  Location: [facility];  Service: Vascular;  Laterality: N/A;   CYSTOSCOPY W/ URETERAL STENT REMOVAL  11/09/2012   Procedure: CYSTOSCOPY WITH STENT REMOVAL;  Surgeon: Ricardo Likens, MD;  Location: [facility];  Service: Urology;  Laterality: Right;   CYSTOSCOPY WITH RETROGRADE PYELOGRAM, URETEROSCOPY AND STENT PLACEMENT  11/09/2012   Procedure: CYSTOSCOPY WITH RETROGRADE PYELOGRAM, URETEROSCOPY AND STENT PLACEMENT;  Surgeon: Ricardo Likens, MD;  Location: [facility];  Service: Urology;  Laterality: Left;  LEFT URETEROSCOPY, STONE MANIPULATION, left STENT exchange    CYSTOSCOPY WITH STENT PLACEMENT  10/02/2012   Procedure: CYSTOSCOPY WITH STENT PLACEMENT;   Surgeon: Ricardo Likens, MD;  Location: [facility];  Service: Urology;  Laterality: Left;   DEBRIDEMENT AND CLOSURE WOUND N/A 01/20/2021   Procedure: Excision of abdominal wound with closure;  Surgeon: Lowery Estefana RAMAN, DO;  Location: [facility];  Service: Plastics;  Laterality: N/A;   DILATION AND CURETTAGE OF UTERUS  11/2002   s/p for 1st trimester nonviable pregnancy   ESOPHAGOGASTRODUODENOSCOPY N/A 09/11/2024   Procedure: EGD (ESOPHAGOGASTRODUODENOSCOPY);  Surgeon: Abran Norleen SAILOR, MD;  Location: [facility];  Service: Gastroenterology;  Laterality: N/A;   EYE SURGERY     sty under eyelid   INCISE AND DRAIN ABCESS  08/2002   s/p I &D for righ inframmary fold hidradenitis   INCISION AND DRAINAGE PERITONSILLAR ABSCESS  12/2001   IR CV LINE INJECTION  06/07/2018   IR FLUORO GUIDE CV LINE RIGHT  01/30/2023   IR IMAGING GUIDED PORT INSERTION  06/20/2018   IR REMOVAL TUN ACCESS W/ PORT W/O FL MOD SED  06/20/2018   IR US  GUIDE VASC ACCESS RIGHT  01/30/2023   IRRIGATION AND DEBRIDEMENT ABSCESS  01/31/2012   Procedure: IRRIGATION AND DEBRIDEMENT ABSCESS;  Surgeon: Alm VEAR Angle, MD;  Location: [facility];  Service: General;  Laterality: Right;  right breast and axilla    LAPAROSCOPIC REPOSITIONING CAPD CATHETER Left 01/10/2024   Procedure: Removal of peritoneal dialysis Catheter.;  Surgeon: Magda Ned SAILOR, MD;  [facility];  Service: Vascular;  Laterality: Left;   LAPAROSCOPY N/A 01/10/2024   Procedure: LAPAROSCOPY, DIAGNOSTIC;  Surgeon: Magda Ned SAILOR, MD;  Location: [facility] OR;  Service: Vascular;  Laterality: N/A;   NEPHROLITHOTOMY  10/02/2012   Procedure: NEPHROLITHOTOMY PERCUTANEOUS;  Surgeon: Ricardo Likens, MD;  [facility]: [facility];  Service: Urology;  Laterality: Right;  First Stage Percutaneous Nephrolithotomy with Surgeon Access, Left Ureteral Stent     NEPHROLITHOTOMY  10/04/2012   Procedure: NEPHROLITHOTOMY PERCUTANEOUS SECOND LOOK;  Surgeon: Ricardo Likens, MD;  [facility];  Service: Urology;  Laterality: Right;      NEPHROLITHOTOMY  10/08/2012  Procedure: NEPHROLITHOTOMY PERCUTANEOUS;  Surgeon: Ricardo Likens, MD;  [facility]: [facility];  Service: Urology;  Laterality: Right;  THIRD STAGE, nephrostomy tube exchange x 2   NEPHROLITHOTOMY  10/11/2012   Procedure: NEPHROLITHOTOMY PERCUTANEOUS SECOND LOOK;  Surgeon: Ricardo Likens, MD;  [facility]: [facility];  Service: Urology;  Laterality: Right;  RIGHT 4 STAGE PERCUTANOUS NEPHROLITHOTOMY, right URETEROSCOPY WITH HOLMIUM LASER    PANNICULECTOMY N/A 12/21/2020   Procedure: PANNICULECTOMY;  Surgeon: Lowery Estefana RAMAN, DO;  [facility]: [facility];  Service: Plastics;  Laterality: N/A;   PERCUTANEOUS NEPHROSTOLITHOTOMY  04/2022   PORT-A-CATH REMOVAL N/A 07/16/2020   Procedure: REMOVAL PORT-A-CATH;  Surgeon: Vanderbilt Ned, MD;  Location: [facility];  Service: General;  Laterality: N/A;   PORTACATH PLACEMENT Left 05/17/2018   Procedure: INSERTION PORT-A-CATH;  Surgeon: Vernetta Berg, MD;  Location: [facility];  Service: General;  Laterality: Left;   RADICAL NECK DISSECTION  04/12/2017   limited/notes 04/12/2017   RADICAL NECK DISSECTION N/A 04/12/2017   Procedure: RADICAL NECK DISSECTION;  Surgeon: Carlie Jariya Reichow, MD;  Location: [facility] OR;  Service: ENT;  Laterality: N/A;  limited neck dissection 2 hours total   REDUCTION MAMMAPLASTY Bilateral 1998   RIGHT/LEFT HEART CATH AND CORONARY ANGIOGRAPHY N/A 03/12/2020   Procedure: RIGHT/LEFT HEART CATH AND CORONARY ANGIOGRAPHY;  Surgeon: Cherrie Toribio SAUNDERS, MD;  [facility];  Service: Cardiovascular;  Laterality: N/A;   RIGHT/LEFT HEART CATH AND CORONARY ANGIOGRAPHY N/A 09/26/2024   Procedure: RIGHT/LEFT HEART CATH AND CORONARY ANGIOGRAPHY;  Surgeon: Darron Deatrice LABOR, MD;  [facility];  Service: Cardiovascular;  Laterality: N/A;   [place]  1994   TEE WITHOUT CARDIOVERSION N/A 08/25/2023   Procedure: TRANSESOPHAGEAL  ECHOCARDIOGRAM;  Surgeon: Lucas Dorise POUR, MD;  [facility];  Service: Open Heart Surgery;  Laterality: N/A;   TENCKHOFF CATHETER INSERTION Left 01/10/2024   Procedure: INSERTION, CATHETER, DIALYSIS, PERITONEAL;  Surgeon: Magda Ned SAILOR, MD;  [facility];  Service: Vascular;  Laterality: Left;   THORACIC AORTIC ENDOVASCULAR STENT GRAFT N/A 02/02/2023   Procedure: THORACIC AORTIC ENDOVASCULAR STENT GRAFT;  Surgeon: Lanis Fonda BRAVO, MD;  Location: [facility] OR;  Service: Vascular;  Laterality: N/A;   THORACIC AORTIC ENDOVASCULAR STENT GRAFT N/A 08/25/2023   Procedure: THORACIC AORTIC ENDOVASCULAR STENT GRAFT;  Surgeon: Lanis Fonda BRAVO, MD;  [facility];  Service: Vascular;  Laterality: N/A;   THYROIDECTOMY  04/12/2017   completion/notes 04/12/2017   THYROIDECTOMY N/A 04/12/2017   Procedure: THYROIDECTOMY;  Surgeon: Carlie Odalis Jordan, MD;  Location: [facility] OR;  Service: ENT;  Laterality: N/A;  Completion Thyroidectomy   TOTAL THYROIDECTOMY  2010   ULTRASOUND GUIDANCE FOR VASCULAR ACCESS Right 08/25/2023   Procedure: ULTRASOUND GUIDANCE FOR VASCULAR ACCESS, RIGHT FEMORAL ARTERY;  Surgeon: Lanis Fonda BRAVO, MD;  Location: [facility] OR;  Service: Vascular;  Laterality: Right;    Family History  Problem Relation Age of Onset   Hypertension Mother    Cancer Mother        laryngeal   Heart disease Mother        stent   Pancreatic cancer Father    Hypertension Father    Lung cancer Father 63       hx smoking   Hypertension Sister    Hypertension Sister    Hypertension Brother    Breast cancer Maternal Aunt 68   Breast cancer Maternal Aunt        dx 60+   Cancer Maternal Uncle  Lung CA   Breast cancer Paternal Aunt 64   Breast cancer Paternal Aunt        dx 69's   Breast cancer Paternal Aunt        dx 50's   Prostate cancer Paternal Uncle    Prostate cancer Paternal Uncle    Lung cancer Paternal Uncle    Breast cancer Cousin 21   Breast cancer Cousin        dx <50   Breast cancer Cousin         dx <50   Breast cancer Cousin        dx <50   Heart disease Other    Hypertension Other    Stroke Other        Grandparent   Kidney disease Other        Grandparent   Diabetes Other        FH of Diabetes   Colon cancer Neg Hx    Esophageal cancer Neg Hx    Rectal cancer Neg Hx    Stomach cancer Neg Hx     Allergies[1]  Medications Ordered Prior to Encounter[2]  BP 112/64   Pulse 85   Temp 97.8 F (36.6 C) (Oral)   Ht 5' 5 (1.651 m)   Wt 167 lb (75.8 kg)   LMP 03/31/2014   SpO2 98%   BMI 27.79 kg/m  Objective:   Physical Exam Cardiovascular:     Rate and Rhythm: Normal rate and regular rhythm.  Pulmonary:     Effort: Pulmonary effort is normal.     Breath sounds: Normal breath sounds.  Musculoskeletal:     Cervical back: Neck supple.  Skin:    General: Skin is warm and dry.  Neurological:     Mental Status: She is alert and oriented to person, place, and time.  Psychiatric:        Mood and Affect: Mood normal.     Physical Exam        Assessment & Plan:  Rib pain on left side Assessment & Plan: Exam limited today as she sits in a wheelchair and is unable to move much.   Will obtain chest x-ray.  Update: Chest x-ray reveals pneumoperitoneum bilaterally.  Stat CT scan of the abdomen pelvis ordered and pending  Orders: -     DG Chest 2 View  Malnutrition of moderate degree Assessment & Plan: Secondary to poor oral intake.  Continue mirtazapine  7.5 mg at bedtime. We discussed that a dose increase may be necessary in the future. She will update.  Referral placed to [facility] for chronic care management.  Orders: -     AMB Referral [facility] Care Management  Decreased appetite Assessment & Plan: Encourage patient to continue mirtazapine  7.5 mg at bedtime. We also discussed today dose adjustment if necessary. She will update early next week.  Orders: -     AMB Referral [facility] Care Management  ESRD (end stage renal disease) (HCC) -     AMB  Referral [facility] Care Management -     CBC  Weight loss -     AMB Referral [facility] Care Management  Hypotension due to hypovolemia Assessment & Plan: Likely due to lack of oral intake coupled with dialysis.  Remain off metoprolol  succinate 12.5 mg for now. Continue mirtazapine  7.5 mg daily.  Orders: -     AMB Referral [facility] Care Management  Hypothyroidism, unspecified type Assessment & Plan: Well-controlled compared to labs from December 2025. Will repeat  TSH and add free T4  Orders: -     TSH -     T4, free    Assessment and Plan Assessment & Plan   I personally spent a total of 60 minutes in the care of the patient today including preparing to see the patient, getting/reviewing separately obtained history, performing a medically appropriate exam/evaluation, counseling and educating, placing orders, documenting clinical information in the EHR, independently interpreting results, communicating results, and coordinating care.       Kristle Wesch K Grantland Want, NP        [1]  Allergies Allergen Reactions   Genvoya [Elviteg-Cobic-Emtricit-Tenofaf] Hives   Lisinopril Cough   Aldactone  [Spironolactone ] Hives   Valium  [Diazepam ] Other (See Comments)    Lethargy (pt states she cannot take this again)   Tegaderm Ag Mesh [Silver] Itching  [2]  Current Outpatient Medications on File Prior to Visit  Medication Sig Dispense Refill   acetaminophen  (TYLENOL ) 325 MG tablet Take 1-2 tablets (325-650 mg total) by mouth every 4 (four) hours as needed for mild pain (pain score 1-3).     allopurinol  (ZYLOPRIM ) 100 MG tablet TAKE 1 TABLET (100 MG TOTAL) BY MOUTH DAILY. FOR GOUT PREVENTION 90 tablet 2   aspirin  81 MG chewable tablet Chew 1 tablet (81 mg total) by mouth daily. 30 tablet 0   busPIRone  (BUSPAR ) 5 MG tablet Take 2 tablets (10 mg total) by mouth 2 (two) times daily.     calcitRIOL  (ROCALTROL ) 0.25 MCG capsule Take 0.25 mcg by mouth 3 (three) times a week.     Darbepoetin Alfa   (ARANESP ) 100 MCG/0.5ML SOSY injection Inject 0.5 mLs (100 mcg total) into the skin every Monday at 6 PM.     dolutegravir  ([name] ) 50 MG tablet Take 1 tablet (50 mg total) by mouth daily. 30 tablet 11   emtricitabine -tenofovir  AF (DESCOVY ) 200-25 MG tablet Take 1 tablet by mouth daily. 30 tablet 11   famotidine  (PEPCID ) 10 MG tablet Take 1 tablet (10 mg total) by mouth daily. 30 tablet 0   feeding supplement (ENSURE PLUS HIGH PROTEIN) LIQD Take 237 mLs by mouth 2 (two) times daily between meals.     fludrocortisone  (FLORINEF ) 0.1 MG tablet Take 1 tablet (0.1 mg total) by mouth daily. 30 tablet 0   fluticasone  (FLOVENT  HFA) 110 MCG/ACT inhaler Inhale 1 puff into the lungs 2 (two) times daily.     hydrocortisone  (CORTEF ) 5 MG tablet Take 3 tablets (15 mg total) by mouth 2 (two) times daily. 180 tablet 0   Lancets (ONETOUCH ULTRASOFT) lancets Use as instructed to test blood sugar daily 100 each 5   levocetirizine (XYZAL ) 5 MG tablet TAKE 1 TABLET BY MOUTH EVERY DAY IN THE EVENING FOR ALLERGIES 90 tablet 1   levothyroxine  (SYNTHROID ) 150 MCG tablet TAKE 1 TABLET BY MOUTH EVERY DAY BEFORE BREAKFAST 30 tablet 0   lidocaine  (LIDODERM ) 5 % PLACE 1 PATCH ONTO THE SKIN DAILY. REMOVE & DISCARD PATCH WITHIN 12 HOURS OR AS DIRECTED BY MD (Patient taking differently: Place 1 patch onto the skin daily as needed. Remove & Discard patch within 12 hours or as directed by MD) 30 patch 0   mirtazapine  (REMERON ) 7.5 MG tablet Take 1 tablet (7.5 mg total) by mouth at bedtime. 30 tablet 0   multivitamin (RENA-VIT) TABS tablet Take 1 tablet by mouth at bedtime. 100 tablet 0   omeprazole  (PRILOSEC) 40 MG capsule Take 1 capsule (40 mg total) by mouth 2 (two) times daily. for heartburn. 90 capsule  2   ondansetron  (ZOFRAN -ODT) 4 MG disintegrating tablet Take 1 tablet (4 mg total) by mouth every 8 (eight) hours as needed for nausea or vomiting. 20 tablet 0   oxyCODONE  (OXY IR/ROXICODONE ) 5 MG immediate release tablet Take 1  tablet (5 mg total) by mouth every 6 (six) hours as needed for moderate pain (pain score 4-6). 28 tablet 0   rosuvastatin  (CRESTOR ) 10 MG tablet TAKE 1 TABLET BY MOUTH EVERY DAY FOR CHOLESTEROL 90 tablet 2   saccharomyces boulardii (FLORASTOR) 250 MG capsule Take 1 capsule (250 mg total) by mouth daily. 30 capsule 0   sevelamer  carbonate (RENVELA ) 800 MG tablet Take 2 tablets (1,600 mg total) by mouth 3 (three) times daily with meals. 180 tablet 0   traZODone  (DESYREL ) 50 MG tablet Take 0.5 tablets (25 mg total) by mouth at bedtime as needed for sleep. 15 tablet 0   [DISCONTINUED] budesonide  (PULMICORT ) 0.25 MG/2ML nebulizer solution Take 2 mLs (0.25 mg total) by nebulization 2 (two) times daily. 60 mL 12   No current facility-administered medications on file prior to visit.   "

## 2024-10-23 NOTE — Assessment & Plan Note (Signed)
 Exam limited today as she sits in a wheelchair and is unable to move much.   Will obtain chest x-ray.  Update: Chest x-ray reveals pneumoperitoneum bilaterally.  Stat CT scan of the abdomen pelvis ordered and pending

## 2024-10-24 ENCOUNTER — Telehealth: Payer: Self-pay | Admitting: Primary Care

## 2024-10-24 ENCOUNTER — Ambulatory Visit
Admission: RE | Admit: 2024-10-24 | Discharge: 2024-10-24 | Disposition: A | Discharge status: Home / Self Care | Source: Ambulatory Visit | Attending: Primary Care | Admitting: Primary Care

## 2024-10-24 DIAGNOSIS — K668 Other specified disorders of peritoneum: Secondary | ICD-10-CM

## 2024-10-24 DIAGNOSIS — E236 Other disorders of pituitary gland: Secondary | ICD-10-CM

## 2024-10-24 MED ORDER — IOPAMIDOL (ISOVUE-300) INJECTION 61%
100.0000 mL | Freq: Once | INTRAVENOUS | Status: AC | PRN
  Administered 2024-10-24: 100 mL via INTRAVENOUS

## 2024-10-24 NOTE — Telephone Encounter (Signed)
 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  She is due for a repeat MRI of her brain to look at the pituitary.  I placed the order for the [facility] location.  Just FYI as they will be calling her to schedule.  If she does not want to proceed then let me know.  Please also have her look at her MyChart for the results.  I left a lengthy note within her results.

## 2024-10-24 NOTE — Patient Outreach (Signed)
 RNCM spoke with patient to scheduled follo wup appointment post hospitalization and rehab. Scheduled and unable to talk further due to on way to another appointment.

## 2024-10-24 NOTE — Telephone Encounter (Signed)
 Spoke with pt/pt's sister, Share, relaying [name]'s message about MRI. I also mentioned about MyChart message [facility] sent. They verbalize understanding and will take a look at MyChart message when they return home.

## 2024-10-25 ENCOUNTER — Other Ambulatory Visit: Payer: Self-pay | Admitting: Primary Care

## 2024-10-25 ENCOUNTER — Telehealth: Payer: Self-pay

## 2024-10-25 ENCOUNTER — Ambulatory Visit: Payer: Self-pay | Admitting: Primary Care

## 2024-10-25 DIAGNOSIS — R110 Nausea: Secondary | ICD-10-CM

## 2024-10-25 DIAGNOSIS — R630 Anorexia: Secondary | ICD-10-CM

## 2024-10-25 MED ORDER — ONDANSETRON 4 MG PO TBDP: 4.0000 mg | ORAL_TABLET | Freq: Three times a day (TID) | ORAL | 0 refills | Status: AC | PRN

## 2024-10-25 MED ORDER — MIRTAZAPINE 15 MG PO TABS: 15.0000 mg | ORAL_TABLET | Freq: Every day | ORAL | 0 refills | Status: AC

## 2024-10-25 NOTE — Telephone Encounter (Signed)
 Refills sent to pharmacy.

## 2024-10-25 NOTE — Telephone Encounter (Signed)
 Copied from CRM #[id number]. Topic: Clinical - Medication Refill >> Oct 25, 2024  2:29 PM Viola F wrote: Medication: ondansetron  (ZOFRAN -ODT) 4 MG disintegrating tablet [[id number]]  ENDED  Has the patient contacted their pharmacy? Yes (Agent: If no, request that the patient contact the pharmacy for the refill. If patient does not wish to contact the pharmacy document the reason why and proceed with request.) (Agent: If yes, when and what did the pharmacy advise?)  This is the patient's preferred pharmacy:  [id number] [place], [place] - [address] [place] [address] Phone: [phone] Fax: [phone]  Is this the correct pharmacy for this prescription? Yes If no, delete pharmacy and type the correct one.   Has the prescription been filled recently? Yes  Is the patient out of the medication? No  Has the patient been seen for an appointment in the last year OR does the patient have an upcoming appointment? Yes  Can we respond through MyChart? Yes  Agent: Please be advised that Rx refills may take up to 3 business days. We ask that you follow-up with your pharmacy.

## 2024-10-25 NOTE — Telephone Encounter (Signed)
 Please notify patient we faxed off the trash request today we saw her.

## 2024-10-25 NOTE — Telephone Encounter (Signed)
 Copied from CRM #[id number]. Topic: General - Other >> Oct 25, 2024  2:26 PM Amy B wrote: Reason for CRM: Patient attempted to request a refill of medication but did not know the name.  She also wanted to know if paperwork had been faxed from the city of [place] regarding trash.  Informed patient I could not submit a refill request without the name of the medication and also informed her that there is no documentation regarding trash from the [place].  Patient stated she would call Mallie and let her know that you would not help me. >> Oct 25, 2024  2:34 PM Viola F wrote: Separate CRM was sent for refill request for the Zofran  medication - patient would like a call back to confirm if fax was received by the city of [place]

## 2024-10-29 ENCOUNTER — Telehealth: Payer: Self-pay | Admitting: Infectious Diseases

## 2024-10-29 NOTE — Telephone Encounter (Addendum)
 Thank you, Corean. I've spent hours sifting through her chart, and I sent a lengthy message to her lab results from 10/23/24. Her sister assured me that she is checking her my chart. I appreciate your time and attention.  Olam, can you call the patient to have her review my lengthy message under her labs from 10/23/24?

## 2024-10-29 NOTE — Telephone Encounter (Signed)
 Left message on voicemail, per dpr, reminding pt to look at 10/23/24 message in her MyChart from [place] related to lab results.   I went over the message with pt and pt's sister but still asked them to look at the message when they got a chance (see 10/26/23 Results F/u).

## 2024-10-29 NOTE — Telephone Encounter (Signed)
 [name] and her sister asked me to call back to review the possibility of her HIV positive status contributing to the multiple hospitalizations of late.   I have not seen her since July 2025 before many of these events took place.  While I was on the phone answering specific questions they had I reviewed multiple hospital stay specialist notes and most recently her visit with her primary care provider.   Most notably [name] continues to struggle with nausea and abdominal pain and weight loss.  She has been only taking in Ensure and very little food otherwise.  She still having some diarrhea but it is not significant.   I am not sure why she has ongoing abdominal pain.  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   I reviewed her previous C. difficile testing and I think that was a soft call and likely a false positive antigen given that the PCR and toxigenic PCR were both negative.   She has been assessed multiple times for the consideration of bacterial peritonitis given the abdominal pain and risk factor for peritoneal dialysis.  She has had multiple cell counts and cultures the last was October 03, 2024.  None of which yielded any positive growth.  In review of her cell counts at most she has only had 61 total cells identified since November.  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  Will update her QuantiFERON gold screen.  However even with TB peritonitis I think we would still expect to see higher more concerning level of white blood cells in her peritoneal fluid. I will place a call to her nephrology team to see if they have any recent cell counts.  If anything comes back with higher than 100 maybe we need to circle back to this thought for chronic atypical peritonitis.   They  mentioned liver dysfunction and cirrhosis.  I see an elevated alkaline phosphatase level which could have alternative explanations back in December but no transaminases measured since that I can see.  I reviewed the CT abdomen pelvis on December 15 and the ultrasound on the ninth and do not see any evidence of cirrhosis.  Her spleen has been routinely commented on to be of normal size.  I told him that I do not have any information or evidence blame any of her symptoms on advancing HIV.  She continues to tolerate her [name]  and Descovy  every day.  Her last viral load in 4 years prior to the 1 in July 2025 have all been undetectable.  Her last CD4 count was 448.  This can fluctuate due to other health conditions as well.  We have an appointment for her to come see me in February so we can update her and her sister on her condition.  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   11/21/2024 appt has been arranged to follow up in person and update HIV pertinent testing.

## 2024-10-30 NOTE — Telephone Encounter (Signed)
 LM for pt to return my call.  Please provide message from provider/office when call is returned from patient.

## 2024-10-30 NOTE — ED Notes (Signed)
 Patient transported to [id number]

## 2024-11-05 ENCOUNTER — Telehealth

## 2024-11-05 ENCOUNTER — Ambulatory Visit: Admitting: Internal Medicine

## 2024-11-05 NOTE — Telephone Encounter (Signed)
 Form was received, signed and original mailed to pt due to no fax number indicated to fax form (see 09/25/24 phn note) and several attempts to contact pt for a fax number with, but no response. And a copy was scanned into pt's chart (see 'Media' tab- 10/24/23 Accessible Collection). Attempted several times to contact pt asking to try to get a fax number, but no response.   Lvm asking pt to call back. Pls get a fax number from pt of where to fax her Accessible Collection form to.   [Printed copy of form and placed in basket on Lisa's desk, ready to fax.]

## 2024-11-06 ENCOUNTER — Other Ambulatory Visit

## 2024-11-06 NOTE — Telephone Encounter (Signed)
 Lvm asking pt to call back. Pls get a fax number from pt of where to fax her Accessible Collection form to.    [Printed copy of form and placed in basket on Lisa's desk, ready to fax.]

## 2024-11-07 ENCOUNTER — Ambulatory Visit: Admitting: Dermatology

## 2024-11-07 NOTE — Progress Notes (Signed)
 " General Medicine Admission Daily Progress Note  Author: MARGERY ELEANORE BALTIMORE, MD  Hospital Admission Date: 10/29/2024  Date of Service: 11/07/2024     Attending of Record: Tobie Katrine Morrow, MD   PCP: Gretta Crank, NP  Location: 8129/8129-01   Patient Overview: Emine Lopata is a 59 y.o. female w/ pituitary apoplexy iso known pituitary mass, HIV, HTN, T2DM, HLD, ESRD on peritoneal dialysis, Graves disease, aortic dissection s/p repair (08/2023), papillary thyroid  carcinoma s/p thyroidectomy, sarcoidosis, OSA, breast cancer s/p chemotherapy, prior nephrolithiasis s/p multiple PCNL who presents due to malaise, cachexia, and abdominal pain.  Subjective   Interval History: - patient reports not feeling well this morning, in tears, stating that she has gotten much weaker from not being able to eat. She has lost weight since the beginning of this admission, and not safe to discharge home if she will continue to not be able to eat.  - she wanted to know whether a feeding tube would be beneficial or not  - reports some abdominal pain - will get GI on board   Objective   Exam:   Current Vital Signs 24h Vital Sign Ranges  T 36.3 C (97.4 F) (11/07/24 0739) Temp  Avg: 36.3 C (97.4 F)  Min: 36.3 C (97.4 F)  Max: 36.3 C (97.4 F)  BP 108/54 (11/07/24 0858) BP  Min: 103/53  Max: 131/68  HR 73 (11/07/24 0739) Pulse  Avg: 72.7  Min: 72  Max: 73  RR 17 (11/07/24 0739) Resp  Avg: 17.8  Min: 16  Max: 20  O2sat 98 %   SpO2  Avg: 98 %  Min: 97 %  Max: 100 %  Weight 78.9 kg (174 lb) (10/30/24 0827)     General: Lying in bed, looks fatigued, in tears  HEENT: EOMI, MMM Pulm: Normal work of breathing and respiratory rate. Lungs clear to auscultation bilaterally. No wheezes or crackles. CV: Normal rate. Regular rhythm. No murmurs or rubs appreciated. Radial pulses 2+  Abdomen: Soft. Non-distended Some tenderness to palpation  No rigidity.  Extremities: No lower extremity edema. No cyanosis or  clubbing. Skin: No rashes or lesions seen. Neuro: Alert and oriented, speech fluent. Grossly normal use of all extremities.  Psych: Alert, oriented, and appropriate.    Labs:    Selected Labs and Data:  Chem: Basic: Recent Labs  Lab 11/04/24 0509 11/05/24 0435 11/06/24 0551  NA 129* 136 135  K 4.0 3.2* 3.4*  CL 92* 95* 92*  CO2 23 26 26   BUN 28* 28* 34*  CREATININE 11.0* 10.7* 10.6*  GLUCOSE 184* 127 93  ANIONGAP 14* 15* 17*  BUNCRE 3* 3* 3*  GFR 4 4 4   CALCIUM  7.8* 8.1* 8.0*  MG 1.8 1.8 1.9  PHOS 6.0* 6.0* 6.6*   GI: Hepatic: Recent Labs  Lab 11/01/24 1956  TOTALPROTEIN 5.3*    Pancreatic: No results for input(s): LIPASE, AMYLASE in the last 168 hours.     Heme: CBC: Recent Labs  Lab 11/04/24 0509 11/05/24 0435 11/06/24 0551  WBC 11.0* 12.4* 11.4*  HGB 11.0* 10.9* 10.8*  HCT 31.5* 31.6* 31.1*  PLT 254 255 247  MCV 100* 102* 101*  RDWCV 14.9* 15.1* 15.4*  MPV 10.2 10.1 10.1   Production: No results for input(s): RETICPCT, RETICPERL, IRF in the last 168 hours.   Hemolysis: No results for input(s): FIB, LDH, HAPTOGLOBIN, TBILI, CONJBILI, DATAHG in the last 168 hours.   Iron : No results for input(s): IRON , FERRITIN, TIBC, PCTSAT in the  last 168 hours.   Metabolites: Recent Labs  Lab 10/31/24 1503  VITD 11*    Coagulation: No results for input(s): APTT, PT, INR, FIB, HEPLMW, DDIMER in the last 168 hours. No results for input(s): TCT, DRVVT, LUPUS, CRDLPNIGG, CRDLPNIGM, CRDLPNIGA in the last 168 hours.  Invalid input(s): B2GP1IIG, B2GP1IIM, B2GP1IIA No results for input(s): HITSCRN, HITPNL in the last 168 hours.  Type: No results for input(s): ABORHTYPE, LABANTI, OUTDATE in the last 168 hours.  Proteins: Recent Labs  Lab 11/01/24 1956  SPERESNAR An asymmetric peak is present in the alpha-2, beta, and gamma regions.  See corresponding immunofixation  electrophoresis results.  I have personally performed this interpretation.  Trenda CANDIE Hood, MD, PhD Professor of Pathology 11/05/2024         Cardiovascular: Biomarkers: No results for input(s): CKTOTAL, CKMB, TROPONINT, TROPONINI, BNP, PROBNP in the last 168 hours.  Invalid input(s): CKMBINDEX  Lipids: No results for input(s): CHOLTOTAL, LDLCALC, HDL, TRIG in the last 168 hours.    Micro:    Urinalysis: No results for input(s): COLORU, CLARITYU, SPECGRAV, LABPH, PROTEINUA, GLUCOSEU, KETONESU, BLOODU, NITRITE, LEUKOCYTESUR, BILIRUBINUR, UROBILINOGEN, RBCUA, WBCUA, SQUAMEPI, HYALINE, BACTERIA in the last 168 hours.  Micro: No results for input(s): URCULT, BLDCULT, CDIFPCR, MRSAPCR in the last 168 hours.  No results for input(s): VANCOTROUGH, VANCORANDOM, GENTTROUGH, GENTRANDOM in the last 168 hours. Recent Labs  Lab 11/04/24 1832 11/04/24 1833  LABGRAM No organisms seen  Rare Polymorphonuclear leukocytes  --   BFNUCLCT  --  56  BFNEUT  --  9  BFMONO  --  41   No results for input(s): CMVM, CMVIGG, HSVPCR in the last 168 hours.  Invalid input(s): HIVINSTR, CVAB  Microbiology <redacted file path>: Culture Data w/ Susceptibilities (1 year) <redacted file path>  Imaging & Other Studies:   Reviewed in medical record.  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 During a recent admission, she had a barium esophagram which showed dysmotility prompting an EGD 09/11/24 that revealed a normal-appearing esophagus, she was empirically dilated to 20 mm balloon the remainder of the study was unremarkable. CT neck soft tissue without organic  cause of dysphagia identified.  However, she also reports lack of appetite for several days in the setting of malaise and anorexia. She had aortic dissection s/p TEVAR in 2024. Last seen by vascular surgery on 08/15/24. Her abdominal pain is particularly tender in LUQ. There is concern that her CT A/P on admission showed celiac trunk sluggishly enhancing on venous phase from the false lumen of her dissection. This combined with her new worsening HFrEF (20% now w/ ventricular dyssynchrony) is concerning for  ongoing bowel ischemia and low output state.  In terms of infection, GIP negative, C diff colonization with no active infection, initial peritoneal fluid studies have ruled out peritonitis with few PMNs and negative bacterial culture and gram stain, though AFB and fungal cultures will take time to result (repeated 1/18, see below) - PO diet as tolerated - Schedule bowel regimen - Aortic graft patent and perfusing abdominopelvic organs, no acute intervention necessary - steroid challenge as below;  - 1/21 barium swallow: small hiatal hernia and mild esophageal dysmotility. Barium tablet became lodged near the gastroesophageal junction.  - 1/22: Consulted GI. She is not  eating to the point where she has lost weight even during this admission. Her barium swallow was abnormal, and she would benefit from GI evaluation inpatient. She is not safe to discharge with outpatient follow up at this point.    #Pituitary apoplexy with Microadenoma #Secondary Adrenal Insufficiency- ruled out #Secondary Hypogonadism Pt w/ hx of pituitary mass c/b apoplexy not undergoing outpatient medical therapy for with regards to hormone replacement. 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 Secondary mass effect on the optic chiasm, worse on the left, but no overt cavernous sinus  invasion. Her MRI brain 10/30/24 is improved with small region of intrinsic T1 hyperintensity within the right aspect of the pituitary gland with heterogeneous signal throughout may represent residual adenoma vs sequela of prior apoplexy. No expansile mass within the pituitary gland identified. ACTH  stimulation test within normal/expected range given degree of hypoalbuminemia. Patient was taking fludrocortisone /hydrocoritsone as recently as Monday of last week. Does appear to have hypogonadism with low FSH/LH despite likely perimenopausal period. Prolactin in normal range.  -IGF-1 level pending -Unable to perform metyrapone stimulation test as patient did not know she needed to eat food with it and did not want to eat food with it -Empiric steroid therapy as per endocrinology recs with:  - 1/17: IV hydrocortisone  50mg  noon and 20mg  1700 - 1/18: PO hydrocortisone  40 mg noon and 20 mg PM - 1/19: PO hydrocortisone  30 mg AM and 15 mg 1500  - 1/20: will plan for PO 20 mg 0700 and 10 mg 1500  - 1/21: will plan for PO 15 mg 0700 and 5 mg 1500  - Continue on PO 15 mg 0700 and 5 mg 1500 daily until endo follow up  []  Endocrinology follow up on discharge; plan for in month    #ESRD on peritoneal dialysis #Secondary Hyperparathyroidism Cr 12.8 on admission. Undergoes PD nightly and she has not missed any sessions. Follows with Nephrology. Determined to not be a transplant candidate during visit on 09/25/24 due to HFrEF. Initially, concern for exit site infection on 1/18, and she was started on empiric keflex  and fluconazole  (with C diff ppx as well). Peritoneal fluid cultures were obtained 1/19 which were unremarkable. On 1/21, new concern for PD catheter infection, given that CT ab/pelvis showed soft tissue thickening and fluid around distal subcutaneous portion of peritoneal dialysis catheter which is slightly increased compared to prior. Seen and evaluated by abdominal transplant surgery on 1/21 that  recommended follow up with the provider that placed the PD catheter but no acute intervention.  -Daily RFP -Nephrology consulted, appreciate recs  - peritoneal fluid cultures unrevealing   - continue empiric keflex  and fluconazole  for 10 days (1/17-1/27)  - Obtained CT ab/pelvis 1/20 that showed soft tissue thickening and fluid around peritoneal dialysis catheter, increased from prior  - 1/21: abdominal transplant surgery stated that no intervention was required, patient should follow up with the provider that placed PD catheter  -Continue nightly PD -B2 microglobulin for ESRD associated amyloid elevated (though more join/soft tissue predominant distribution)   #HFrEF (EF 20% on 10/30/24 from 30% 12/25) #NICM #Moderate to Severe MR #Ventricular Dyssynchrony 2/2 LBBB EKG demonstrated left bundle branch lock with PACs. TTE 10/30/24 shows worsening EF of 20% from 30-35% on 09/2024. Moderate MR is stable. Cardiology consulted at OSH and underwent right heart cath 09/26/2024 which showed Tortuous but normal coronary arteries, normal right  atrial pressure, mild pulmonary hypertension, mildly elevated wedge pressure and normal cardiac output RA: 5 mmHg RV: 37/8 mmHg PW: 18 mmHg PA: 37/18 with a mean of 23 mmHg Cardiac output/index: 5.59/2.94. Overall unclear etiology for HF. - Increased Metoprolol  Succinate to 150 mg on 1/20 with goal to titrate to 200 mg  - Candesartan  4mg  (started 1/17) with goal to double dose every 1-2 weeks as tolerated to dose of 32mg  - Amyloidosis workup: SFLC, SPEP, UPEP, IFE []  cMRI as outpatient  #C Difficile Colonization  Patient with evidence of C. Diff on stool testing but without active production of toxin indicating colonization. -CTM and ppx w/ abx as above  #Macrocytic anemia Hgb 10.9 on admission. Unclear etiology. Likely in part due to malnutrition. - F/u iron /B12/folate   #Anxiety/depression #Insomnia - Continue home buspirone  - Continue home Remeron  -  Continue home trazodone    #Hx of papillary adenocarcinoma of thyroid  #S/p thyroidectomy #Iatrogenic hypothyroidism S/p total thyroidectomy on 09/15/08 for Graves' disease. Presented to outpatient ENT w/ dysphagia and CT neck showed multiple enlarged lymph nodes. On 01/12/17 underwent left thyroid  FNA w/ benign follicular tissue but FNA left cervical LN + for papillary thyroid  cancer; s/p completion thyroidectomy, limited left neck dissection 04/12/17 with pathology revealing no malignancy identified in thyroid  or LN tissue specimens. As a consequence, she has post-surgical hypothyroidism. Thyroid  US  08/03/20 w/o residual thyroid  tissue. 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 05/17/18-07/16/20. Was previously on tamoxifen  for a while, but is no longer taking this.   #HIV Patient with HIV, has been compliant with HAART. HIV PCR near undetectable on this admission. Also has evidence of near undetectable CMV viremia that is clinically insignificant.  - Continue home HAART - [place] [facility] appt 11/21/24  #HTN: Borderline BP on admission. Hold home metoprolol  #GERD: Pantoprazole  qday #HLD: Continue home rosuvastatin  #T2DM: SSI/POC glucose #Gout: Continue home allopurinol    Daily Checklist: Diet: Diet regular Oral Supplements - Adult All Supplements; Novasource Renal-Cafe Mocha; With Meals  Pain control: APAP, prn oxycodone  5mg  VTE Prophylaxis: unfractionated heparin  Bowel regimen: Scheduled miralax mason Lines/Drains/Airways:  Patient Lines/Drains/Airways Status     Active Lines,Drains,Airways     Name Placement date Placement time Site Days   Peripheral IV 10/31/24 Right Upper  arm;Cephalic 10/31/24  1657  Upper arm;Cephalic  less than 1   Wound 92/80/76 Urethra 05/04/22  0845  Urethra  911   Wound 05/04/22 Lower;Right Flank 05/04/22  --  Flank  911   Wound 10/30/24 Anterior Coccyx 10/30/24  1847  Coccyx  less than 1   Wound 10/30/24 Upper;Anterior;Right Arm 10/30/24  1850  Arm  less than 1           PT/OT/Early mobility ordered: yes   Code Status: Full Code  Emergency Contact:  Primary Emergency Contact: Mason,Share, Mobile Phone: [phone] Discharge Planning: pending resolution of acute issues PCP: Gretta Crank, Office: [phone], Fax: [phone]   Discharge Planning:  Barriers to discharge: Medical stability Anticipated Discharge Needs: pending Anticipated Discharge Destination: Home Pharmacy:  [id number] - [place], [place] - [address] [place] [address] Phone: [phone] Fax: [phone]  PCP: Gretta Crank, Office: [phone], Fax: [phone]   ____________________________  MARGERY ELEANORE BALTIMORE, MD  11/07/2024  2:07 PM   ------------------------------------------------------------------------------- Attestation signed by Tobie Katrine Morrow, MD at 11/07/2024  9:28 PM Attending Attestation   (FR) I confirm that I was present for the key and critical portions of the service, including a review of the patient's history and other pertinent data.  I personally examined the patient, and formulated the evaluation and/or treatment plan.  I have reviewed the note of the house staff and agree with the findings documented in the note, with any exceptions as noted below.  58 y.o. female w/ pituitary apoplexy iso known pituitary mass, HIV, HTN, T2DM, HLD, ESRD on peritoneal dialysis, Graves disease, aortic dissection s/p repair (08/2023), papillary thyroid  carcinoma s/p thyroidectomy, sarcoidosis, OSA, breast cancer s/p chemotherapy, prior nephrolithiasis s/p multiple PCNL who presents due to  malaise, cachexia, and abdominal pain. - ? Improvement with hydocort, working on titration w/ endocrine service  - CT w/ c/f PD site fluid; seen by txp surgery and follow w/ surgeon as outpatient (complete 10 days of [facility] coverage) - Discuss w/ GI given abnl barium and poor PO tolerance - Would like to engage in psychiatric care, will discuss what a good optin would be  NILESH MORROW TOBIE, MD   ------------------------------------------------------------------------------- "

## 2024-11-07 NOTE — Telephone Encounter (Addendum)
 Lvm asking pt to call back. Pls get a fax number from pt of where to fax her Accessible Collection form to.   Also, sending a MyChart message.    [Printed copy of form and placed in basket on Lisa's desk, ready to fax.]

## 2024-11-10 ENCOUNTER — Other Ambulatory Visit: Payer: Self-pay | Admitting: Primary Care

## 2024-11-10 DIAGNOSIS — I10 Essential (primary) hypertension: Secondary | ICD-10-CM

## 2024-11-13 NOTE — Progress Notes (Signed)
 " General Medicine Admission Daily Progress Note  Author: MARGERY ELEANORE BALTIMORE, MD  Hospital Admission Date: 10/29/2024  Date of Service: 11/13/2024     Attending of Record: Starr Elvina Coder, MD   PCP: Gretta Crank, NP  Location: 8129/8129-01   Patient Overview: [name] is a 59 y.o. female w/ pituitary apoplexy iso known pituitary mass, HIV, HTN, T2DM, HLD, ESRD on peritoneal dialysis, Graves disease, aortic dissection s/p repair (08/2023), papillary thyroid  carcinoma s/p thyroidectomy, sarcoidosis, OSA, breast cancer s/p chemotherapy, prior nephrolithiasis s/p multiple PCNL who presents due to malaise, cachexia, and abdominal pain.  Subjective   Interval History: - NAEON - tolerating TF with only mild abdominal fullness and nausea, no vomiting. Rate currently 50cc/hr  - XR will place post pyloric Dobhoff tube today, as that is what will likely be covered by insurance  - pending set up of home health for tube feeds prior to discharge - sister is arranging for more care at home, and was interviewing people this week - patient expressing that she has enough support at home and would be able to manage her tube feeds  - as of yesterday, family has set up her dialysis treatments to be downstairs on the 1st floor and moved her bed downstairs as well.   Objective   Exam:   Current Vital Signs 24h Vital Sign Ranges  T 37.3 C (99.1 F) (11/13/24 0856) Temp  Avg: 36.7 C (98.1 F)  Min: 36.3 C (97.3 F)  Max: 37.3 C (99.1 F)  BP 120/62 (11/13/24 0856) BP  Min: 102/44  Max: 120/62  HR 99 (11/13/24 0856) Pulse  Avg: 90  Min: 76  Max: 99  RR 17 (11/12/24 2341) Resp  Avg: 17  Min: 17  Max: 17  O2sat 99 %   SpO2  Avg: 96.7 %  Min: 95 %  Max: 99 %  Weight 78.9 kg (174 lb) (10/30/24 0827)     General: Lying in bed, looks fatigued HEENT: EOMI, MMM, NGT in right nare Pulm: Normal work of breathing and respiratory rate. Lungs clear to auscultation bilaterally. No wheezes or  crackles. CV: Normal rate. Regular rhythm. No murmurs or rubs appreciated. Radial pulses 2+  Abdomen: Soft. Non-distended Some tenderness to palpation  No rigidity.  Extremities: No lower extremity edema. No cyanosis or clubbing. Skin: No rashes or lesions seen. Neuro: Alert and oriented, speech fluent. Grossly normal use of all extremities.  Psych: Alert, oriented, and appropriate.    Labs:    Selected Labs and Data:  Chem: Basic: Recent Labs  Lab 11/11/24 [phone] 11/12/24 0656 11/13/24 0607  NA 133* 134* 135  K 4.5 4.4 5.2*  CL 93* 93* 94*  CO2 23 25 24   BUN 51* 57* 67*  CREATININE 9.7* 10.2* 9.4*  GLUCOSE 69* 124 102  ANIONGAP 17* 16* 17*  BUNCRE 5* 6 7  GFR 4 4 4   CALCIUM  8.2* 8.3* 8.7  MG 2.1 2.0 2.1  PHOS 6.1* 5.0* 4.2   GI: Hepatic: No results for input(s): AST, ALT, TBILI, CONJBILI, ALKPHOS, GGT, ALB, TOTALPROTEIN in the last 168 hours.   Pancreatic: No results for input(s): LIPASE, AMYLASE in the last 168 hours.     Heme: CBC: Recent Labs  Lab 11/10/24 0647 11/11/24 0633 11/12/24 0656  WBC 10.2* 11.0* 10.6*  HGB 9.4* 10.0* 9.5*  HCT 27.1* 27.9* 27.2*  PLT 191 167 158  MCV 100* 99* 98  RDWCV 16.1* 16.1* 15.9*  MPV 10.9 11.6 11.3   Production:  No results for input(s): RETICPCT, RETICPERL, IRF in the last 168 hours.   Hemolysis: No results for input(s): FIB, LDH, HAPTOGLOBIN, TBILI, CONJBILI, DATAHG in the last 168 hours.   Iron : No results for input(s): IRON , FERRITIN, TIBC, PCTSAT in the last 168 hours.   Metabolites: No results for input(s): VITB12, FOLATE, HOMOCYS, MMA, VITD in the last 168 hours.   Coagulation: No results for input(s): APTT, PT, INR, FIB, HEPLMW, DDIMER in the last 168 hours. No results for input(s): TCT, DRVVT, LUPUS, CRDLPNIGG, CRDLPNIGM, CRDLPNIGA in the last 168 hours.  Invalid input(s): B2GP1IIG, B2GP1IIM, B2GP1IIA No results  for input(s): HITSCRN, HITPNL in the last 168 hours.  Type: No results for input(s): ABORHTYPE, LABANTI, OUTDATE in the last 168 hours.  Proteins: No results for input(s): SPEINTERP, SPERESNAR in the last 168 hours.    Cardiovascular: Biomarkers: No results for input(s): CKTOTAL, CKMB, TROPONINT, TROPONINI, BNP, PROBNP in the last 168 hours.  Invalid input(s): CKMBINDEX  Lipids: No results for input(s): CHOLTOTAL, LDLCALC, HDL, TRIG in the last 168 hours.    Micro:    Urinalysis: No results for input(s): COLORU, CLARITYU, SPECGRAV, LABPH, PROTEINUA, GLUCOSEU, KETONESU, BLOODU, NITRITE, LEUKOCYTESUR, BILIRUBINUR, UROBILINOGEN, RBCUA, WBCUA, SQUAMEPI, HYALINE, BACTERIA in the last 168 hours.  Micro: No results for input(s): URCULT, BLDCULT, CDIFPCR, MRSAPCR in the last 168 hours.  No results for input(s): VANCOTROUGH, VANCORANDOM, GENTTROUGH, GENTRANDOM in the last 168 hours. No results for input(s): CSFC, LABGRAM, FLUIDTYPE, CSFCOLOR, CSFCLAR, GLUCCSF, PROTEINCSF, CSFRBC, CSFNUC, BFNUCLCT, CSFNEUT, BFNEUT, CSFLYMPH, CSFMONO, BFMONO, CSFEPEND, CSFMAC, CELLTYPE1, OTCELL1, CELLTYPE2, OTCELL2 in the last 168 hours.  No results for input(s): CMVM, CMVIGG, HSVPCR in the last 168 hours.  Invalid input(s): HIVINSTR, CVAB  Microbiology <redacted file path>: Culture Data w/ Susceptibilities (1 year) <redacted file path>  Imaging & Other Studies:   Reviewed in medical record.  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 During a recent admission, she had a barium esophagram which showed  dysmotility prompting an EGD 09/11/24 that revealed a normal-appearing esophagus, she was empirically dilated to 20 mm balloon the remainder of the study was unremarkable. CT neck soft tissue without organic cause of dysphagia identified.  However, she also reports lack of appetite for several days in the setting of malaise and anorexia. She had aortic dissection s/p TEVAR in 2024. Last seen by vascular surgery on 08/15/24. Her abdominal pain is particularly tender in LUQ. There is concern that her CT A/P on admission showed celiac trunk sluggishly enhancing on venous phase from the false lumen of her dissection. This combined with her new worsening HFrEF (20% now w/ ventricular dyssynchrony) is concerning for  ongoing bowel ischemia and low output state.  In terms of infection, GIP negative, C diff colonization with no active infection, initial peritoneal fluid studies have ruled out peritonitis with few PMNs and negative bacterial culture and gram stain, though AFB and fungal cultures will take time to result (repeated 1/18, see below) - PO diet as tolerated - Schedule bowel regimen - Aortic graft patent and perfusing abdominopelvic organs, no acute intervention necessary - steroid challenge as below;  - 1/21 barium swallow: small hiatal hernia and mild esophageal dysmotility. Barium tablet became lodged near the gastroesophageal junction.  - 1/22: Consulted GI:   -  Recommend VFSS inpatient and esophageal manometry as an outpatient. - We do not recommend an EGD for further evaluation at this time after reviewing November 11/26 EGD.  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 Secondary mass effect on the optic chiasm, worse on the left, but no overt cavernous sinus invasion. Her MRI brain 10/30/24 is improved with small region of intrinsic T1 hyperintensity within the right aspect of the pituitary gland with heterogeneous signal throughout may represent residual adenoma vs sequela of prior apoplexy. No expansile mass within the pituitary gland identified. ACTH  stimulation test within normal/expected range given degree of hypoalbuminemia. Patient was taking fludrocortisone /hydrocoritsone as recently as Monday of last week. Does appear to have hypogonadism with low FSH/LH despite likely perimenopausal period. Prolactin in normal range.  -IGF-1 level pending -Unable to perform metyrapone stimulation test as patient did not know she needed to eat food with it and did not want to eat food with it -Empiric steroid therapy as per endocrinology recs with:  - 1/17: IV hydrocortisone  50mg  noon and 20mg  1700 - 1/18: PO hydrocortisone  40 mg noon and 20 mg PM - 1/19: PO hydrocortisone  30 mg AM and 15 mg 1500  - 1/20: will plan for PO 20 mg 0700 and 10 mg 1500  - 1/21: will plan for PO 15 mg 0700 and 5 mg 1500  - Continue on PO 15 mg 0700 and 5 mg 1500 daily until endo follow up  []  Endocrinology follow up on discharge; plan for in month    #ESRD on peritoneal dialysis #Secondary Hyperparathyroidism Cr 12.8 on admission. Undergoes PD nightly and she has not missed any sessions.  Follows with Nephrology. Determined to not be a transplant candidate during visit on 09/25/24 due to HFrEF. Initially, concern for exit site infection on 1/18, and she was started on empiric keflex  and fluconazole  (with C diff ppx as well). Peritoneal fluid cultures were obtained 1/19 which were unremarkable. On 1/21, new concern for PD catheter infection, given that CT ab/pelvis on 1/20 showed soft tissue thickening and fluid around distal subcutaneous portion of peritoneal dialysis catheter which is slightly increased compared to prior. Seen and evaluated by abdominal transplant surgery on 1/21 that recommended follow up with the provider that placed the PD catheter but no acute intervention. She was treated empirically with keflex  and fluconazole  for 10 days from 1/17-1/27 as per nephrology recommendations.  -Daily RFP -Nephrology consulted, appreciate recs  - peritoneal fluid cultures unrevealing   - S/p empiric keflex  and fluconazole  for 10 days (1/17-1/27)  - Obtained CT ab/pelvis 1/20 that showed soft tissue thickening and fluid around peritoneal dialysis catheter, increased from prior  - 1/21: abdominal  transplant surgery stated that no intervention was required, patient should follow up with the provider that placed PD catheter  -Continue nightly PD -B2 microglobulin for ESRD associated amyloid elevated (though more join/soft tissue predominant distribution)   #HFrEF (EF 20% on 10/30/24 from 30% 12/25) #NICM #Moderate to Severe MR #Ventricular Dyssynchrony 2/2 LBBB EKG demonstrated left bundle branch lock with PACs. TTE 10/30/24 shows worsening EF of 20% from 30-35% on 09/2024. Moderate MR is stable. Cardiology consulted at OSH and underwent right heart cath 09/26/2024 which showed Tortuous but normal coronary arteries, normal right atrial pressure, mild pulmonary hypertension, mildly elevated wedge pressure and normal cardiac output RA: 5 mmHg RV: 37/8 mmHg PW: 18 mmHg PA: 37/18 with a mean of  23 mmHg Cardiac output/index: 5.59/2.94. Overall unclear etiology for HF. - Increased Metoprolol  Succinate to 150 mg on 1/20 with goal to titrate to 200 mg  - Candesartan  4mg  (started 1/17) with goal to double dose every 1-2 weeks as tolerated to dose of 32mg  - *1/24-1/25: held metoprolol  and candesartan  due to soft BP, restarted on 1/26  - Amyloidosis workup: SFLC, SPEP, UPEP, IFE []  cMRI as outpatient  #C Difficile Colonization  Patient with evidence of C. Diff on stool testing but without active production of toxin indicating colonization. -CTM and ppx w/ abx as above  #Macrocytic anemia Hgb 10.9 on admission. Unclear etiology. Likely in part due to malnutrition. - F/u iron /B12/folate   #Anxiety/depression #Insomnia - Continue home buspirone  - Continue home Remeron  - Continue home trazodone    #Hx of papillary adenocarcinoma of thyroid  #S/p thyroidectomy #Iatrogenic hypothyroidism S/p total thyroidectomy on 09/15/08 for Graves' disease. Presented to outpatient ENT w/ dysphagia and CT neck showed multiple enlarged lymph nodes. On 01/12/17 underwent left thyroid  FNA w/ benign follicular tissue but FNA left cervical LN + for papillary thyroid  cancer; s/p completion thyroidectomy, limited left neck dissection 04/12/17 with pathology revealing no malignancy identified in thyroid  or LN tissue specimens. As a consequence, she has post-surgical hypothyroidism. Thyroid  US  08/03/20 w/o residual thyroid  tissue. 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 05/17/18-07/16/20. Was previously on tamoxifen  for a while, but is no longer taking this.   #HIV Patient with HIV, has been compliant with HAART.  HIV PCR near undetectable on this admission. Also has evidence of near undetectable CMV viremia that is clinically insignificant.  - Continue home HAART - [place] [facility] appt 11/21/24  #HTN: Borderline BP on admission. Hold home metoprolol  #GERD: Pantoprazole  qday #HLD: Continue home rosuvastatin  #T2DM: SSI/POC glucose #Gout: Continue home allopurinol    Daily Checklist: Diet: Oral Supplements - Adult All Supplements; Novasource Renal-Cafe Mocha; With Meals Diet regular tube feeding (NUTREN 1.5) liquid  Pain control: APAP, prn oxycodone  5mg  VTE Prophylaxis: unfractionated heparin  Bowel regimen: Scheduled miralax mason Lines/Drains/Airways:  Patient Lines/Drains/Airways Status     Active Lines,Drains,Airways     Name Placement date Placement time Site Days   Peripheral IV 10/31/24 Right Upper arm;Cephalic 10/31/24  1657  Upper arm;Cephalic  less than 1   Wound 92/80/76 Urethra 05/04/22  0845  Urethra  911   Wound 05/04/22 Lower;Right Flank 05/04/22  --  Flank  911   Wound 10/30/24 Anterior Coccyx 10/30/24  1847  Coccyx  less than 1   Wound  10/30/24 Upper;Anterior;Right Arm 10/30/24  1850  Arm  less than 1           PT/OT/Early mobility ordered: yes   Code Status: Full Code  Emergency Contact:  Primary Emergency Contact: Mason,Share, Mobile Phone: [phone] Discharge Planning: pending resolution of acute issues PCP: Gretta Crank, Office: [phone], Fax: [phone]   Discharge Planning:  Barriers to discharge: Medical stability Anticipated Discharge Needs: pending Anticipated Discharge Destination: Home Pharmacy:  [id number] - [place], [place] - [address] [place] [address] Phone: [phone] Fax: [phone]  PCP: Gretta Crank, Office: [phone], Fax: [phone]   ____________________________  MARGERY ELEANORE BALTIMORE, MD  11/13/2024 11:04 AM    ------------------------------------------------------------------------------- Attestation signed by Starr Elvina Coder, MD at 11/13/2024  3:18 PM Attestation Statement:   I personally saw and evaluated the patient, and participated in the management and treatment plan as documented in the resident/fellow note.  NOPPON SHAUNNA STARR, MD  ------------------------------------------------------------------------------- "

## 2024-11-14 NOTE — Progress Notes (Signed)
 [facility]   Re-Education  Re-educated patient on feeding rate of Novasource Renal from 40 ml/hr to 16 ml/hr x 16 hours per day. Pt. verbalized understanding.    Written instructions provided   Eleanor Bologna,, BSN, RN  Infusion Educator [facility]

## 2024-11-14 NOTE — Progress Notes (Signed)
 "  Nephrology Consult Progress Note: Peritoneal Dialysis  Interval History    No acute events overnight. UF charted at 364 Borderline low BP On RA NGT in place Pending dispo  Medications    acetaminophen   975 mg Oral TID   allopurinol   100 mg Oral Daily   b complex multivitamin  1 tablet Oral QHS   buspirone   5 mg Oral BID   candesartan   4 mg Oral Daily   cholecalciferol  50,000 Units Oral Weekly   dolutegravir   50 mg Oral Daily   emtricitabine -tenofovir  alafenamide  1 tablet Oral Daily   gentamicin    Topical QHS   heparin  (porcine)  5,000 Units Subcutaneous Q8H [facility]   hydrocortisone   15 mg Oral Q24H   hydrocortisone   5 mg Oral Q24H   insulin  LISPRO (AdmeLOG, HumaLOG) injection  0-12 Units Subcutaneous TID CC   levothyroxine   125 mcg Oral Daily before breakfast (0630)   loratadine   10 mg Oral Daily   metoprolol  SUCCinate  150 mg Oral Daily   metoprolol  SUCCinate  50 mg Oral Once   mirtazapine   15 mg Oral QHS   pantoprazole   40 mg Oral BID AC   polyethylene glycol  17 g Oral Daily   rosuvastatin   10 mg Oral Daily   sennosides-docusate  2 tablet Oral BID   sevelamer  carbonate  1,600 mg Oral TID CC   thiamine   100 mg Oral Daily   tube feeding   Via Tube Cyclic - Night 16 hrs (1800-1000)   vancomycin   125 mg Oral QHS    Objective     Current Vital Signs 24h Vital Sign Ranges  T 36.6 C (97.8 F) Temp  Avg: 36.5 C (97.7 F)  Min: 36.4 C (97.6 F)  Max: 36.6 C (97.8 F)  BP 101/49 BP  Min: 87/35  Max: 123/59  HR 85 Pulse  Avg: 84.5  Min: 84  Max: 85  RR 16 Resp  Avg: 17.3  Min: 16  Max: 18  SaO2 94 %   SpO2  Avg: 95.4 %  Min: 92 %  Max: 99 %          Ins & Outs I/O last 2 completed shifts: In: 1111.7 [P.O.:100; NG/GT:150] Out: 556 [Drains:556] Current Shift:  01/29 0701 - 01/29 1900 In: 30  Out: 364 [Drains:364]   Gen: appears comfortable Pulm: on room air, lying flat Abd: no pain, catheter C/D/I Extr: no significant peripheral edema    Labs   Recent Labs  Lab 11/12/24 0656 11/13/24 1212 11/14/24 0542  WBC 10.6* 13.5* 12.2*  HGB 9.5* 9.0* 9.3*  PLT 158 135* 146*   Recent Labs  Lab 11/12/24 0656 11/13/24 0607 11/14/24 0542  NA 134* 135 133*  K 4.4 5.2* 4.6  CL 93* 94* 92*  CO2 25 24 22   BUN 57* 67* 74*  CREATININE 10.2* 9.4* 9.2*  CALCIUM  8.3* 8.7 8.7  GLUCOSE 124 102 110   Lab Results  Component Value Date   PTH 331 (H) 10/31/2024   CALCIUM  8.7 11/14/2024   PHOS 3.9 11/14/2024   Lab Results  Component Value Date   IRON  64 10/30/2024   TIBC <98 (L) 10/30/2024   FERRITIN 5,268 (H) 10/30/2024   Assessment and Recommendations   [name] is 59 y.o. female ESKD on PD who is admitted due to dysphagia, abdominal pain, and cachexia requiring NGT placement and enteral feeding. Medical history additionally includes pituitary mass with suspected apoplexy, HIV, type 2 diabetes, sarcoidosis, breast  cancer s/p chemo, papillary thyroid  cancer, and graves s/p completes thyroid  resection, adrenal adenoma, non ischemic cardiomyopathy (EF < 20%).   ESKD on PD Exit site infection without evidence of peritonitis (fluid studies 1/14, 1/20)  Continue routine PD while inpatient:  10 hours, 5 cycles, 2.25 L fill volume, home prescription of 1.5% and 2.5% dianeal . No last fill.  Completed 10 days of Keflex  for ESI  If abx are given to patient, please give antifungal ppx: either Nystatin  500,000 units 4 times daily for the duration of antibacterial therapy OR Fluconazole  200 mg every other day for the duration of antibiotic therapy Ensure daily bowel movements as this can limit appropriate PD drainage   CKD Mineral and Bone Disorder (MBD) Phos goal 3.5-5.5 mg/dL per K/DOQI guidelines Check phosphate on dialysis days Continue sevelamer  1,600 mg TID with meals; hold if NPO or phos < 3.5     Anemia Hb 9.4, at goal    Standard Recommendations   Please dose all medications for reduced eGFR. Avoid NSAIDs, morphine ,  and baclofen. Avoid magnesium -containing laxatives (milk of magnesia, magnesium  hydroxide) and enemas (SMOG). Avoid phosphate-containing laxative (sodium phosphate ) enemas (Fleet phospho-soda). Use caution when administering iodinated contrast with eGFR < 30  Thank you for this consult.  Please feel free to call with questions or concerns.   Nephrology Fellow  Floor service pager [phone]   The Nephrology Consult team is available in-house from 7A-5P. If urgent assistance is needed outside of these hours, please page the Nephrology on-call pager [phone] for assistance. The Nephrology consult pagers are available after business hours for emergencies only.   ------------------------------------------------------------------------------- Attestation signed by Vania Brands, Sharlet Paling, MD at 11/14/2024  9:37 PM Attestation Statement:   I personally saw and evaluated the patient, and participated in the management and treatment plan as documented in the resident/fellow note.  Patient tolerating peritoneal dialysis well. UF , not volume overload.  No changes recommended to her current prescription.   PAMELA CATALINA VANIA BRANDS, MD  ------------------------------------------------------------------------------- "

## 2024-11-14 NOTE — Progress Notes (Signed)
 [facility] Dialysis Discharge Information Patient Name: [name]     Date of Birth: 1966/03/24     [facility] MRN: [id number]    [facility]  Outpatient Dialysis Unit:  [facility] [address] [place] [address]  Date of Hospital Admission: 10/29/2024 Date of Hospital Discharge: 11/14/2024  Principle Diagnosis: Failure to thrive syndrome, adult  Allergies:  Allergies  Allergen Reactions   Adhesive Rash    Okay with tegaderm and paper tape   Lisinopril Cough   Lidocaine  Other (See Comments)    Unknown , patient states she has had lidocaine  for IV starts and at the dentist with no problems   Silver Itching    New dry weight: No change in estimated dry weight  Antibiotic Order: None     Additional comments/recommendations:  Addmitted for failure to thrive and started on tube feeds. Phos binders held at discharge. Was treated for exit site infection based on some inflammation seen on CT scan near PD catheter, no cultures were able to be obtained. 10 days of keflex . Peritonitis was negative on multiple peritoneal fluid samples   Blood cultures this visit:   Results for orders placed or performed during the hospital encounter of 10/29/24  Culture, Blood   Collection Time: 10/30/24  6:17 AM   Specimen: Percutaneous, venous; Blood  Result Value Ref Range   Culture Blood No growth detected.   Culture, Blood   Collection Time: 10/30/24  6:01 AM   Specimen: Percutaneous, venous; Blood  Result Value Ref Range   Culture Blood No growth detected.     Hemoglobin at discharge:  Lab Results  Component Value Date   HGB 9.3 (L) 11/14/2024     None    None    None    Discharge Medication List:    Medication List     PAUSE taking these medications    sevelamer  carbonate 800 mg tablet Wait to take this until your doctor or other care provider tells you to start again. Commonly known as: RENVELA  Take 2  tablets (1,600 mg total) by mouth 3 (three) times daily with meals Pause taking until instructed What changed:  how much to take additional instructions       CONTINUE taking these medications    albuterol  MDI (PROVENTIL , VENTOLIN , PROAIR ) HFA 90 mcg/actuation inhaler   allopurinol  100 MG tablet Commonly known as: ZYLOPRIM    aspirin  81 MG EC tablet   buspirone  5 MG tablet Commonly known as: BUSPAR    DESCOVY  200-25 mg tablet Generic drug: emtricitabine -tenofovir  alafenamide   levocetirizine 5 MG tablet Commonly known as: XYZAL    mirtazapine  7.5 MG tablet Commonly known as: REMERON    omeprazole  40 MG DR capsule Commonly known as: PriLOSEC   RENA-VITE 0.8 mg Generic drug: b complex multivitamin   rosuvastatin  10 MG tablet Commonly known as: CRESTOR        STOP taking these medications    calcitRIOL  0.25 MCG capsule Commonly known as: ROCALTROL    cetirizine  10 MG tablet Commonly known as: ZyrTEC    fludrocortisone  0.1 mg tablet Commonly known as: FLORINEF    hydrocortisone  5 MG tablet Commonly known as: CORTEF    levothyroxine  150 MCG tablet Commonly known as: SYNTHROID    ondansetron  4 MG disintegrating tablet Commonly known as: ZOFRAN -ODT   oxyCODONE  5 MG immediate release tablet Commonly known as: ROXICODONE    [name]  50 mg tablet Generic drug: dolutegravir    tiZANidine  4 MG tablet Commonly known as: ZANAFLEX   Where to Get Your Medications     These medications were sent to [id number] - [place], [place] - [address]  [address], [place] [address]    Phone: [phone]  sevelamer  carbonate 800 mg tablet      NORLEEN PATSY KINGSLEY, MD   ------------------------------------------------------------------------------- Attestation signed by Vania Brands, Sharlet Paling, MD at 11/14/2024  9:38 PM Attestation Statement:   I personally saw and evaluated the patient, and participated in the management  and treatment plan as documented in the resident/fellow note.  PAMELA CATALINA VANIA BRANDS, MD  -------------------------------------------------------------------------------

## 2024-11-15 ENCOUNTER — Telehealth: Payer: Self-pay | Admitting: *Deleted

## 2024-11-15 ENCOUNTER — Encounter (HOSPITAL_COMMUNITY): Payer: Self-pay | Admitting: Internal Medicine

## 2024-11-15 ENCOUNTER — Observation Stay (HOSPITAL_COMMUNITY)

## 2024-11-15 ENCOUNTER — Observation Stay (HOSPITAL_COMMUNITY): Admission: EM | Admit: 2024-11-15 | Source: Home / Self Care | Attending: Family Medicine | Admitting: Family Medicine

## 2024-11-15 DIAGNOSIS — F330 Major depressive disorder, recurrent, mild: Secondary | ICD-10-CM | POA: Diagnosis not present

## 2024-11-15 DIAGNOSIS — E1169 Type 2 diabetes mellitus with other specified complication: Secondary | ICD-10-CM | POA: Diagnosis not present

## 2024-11-15 DIAGNOSIS — Z9889 Other specified postprocedural states: Secondary | ICD-10-CM

## 2024-11-15 DIAGNOSIS — F411 Generalized anxiety disorder: Secondary | ICD-10-CM | POA: Diagnosis not present

## 2024-11-15 DIAGNOSIS — I10 Essential (primary) hypertension: Secondary | ICD-10-CM | POA: Diagnosis present

## 2024-11-15 DIAGNOSIS — E039 Hypothyroidism, unspecified: Secondary | ICD-10-CM | POA: Diagnosis present

## 2024-11-15 DIAGNOSIS — R933 Abnormal findings on diagnostic imaging of other parts of digestive tract: Secondary | ICD-10-CM | POA: Diagnosis present

## 2024-11-15 DIAGNOSIS — E875 Hyperkalemia: Secondary | ICD-10-CM | POA: Diagnosis not present

## 2024-11-15 DIAGNOSIS — Z8679 Personal history of other diseases of the circulatory system: Secondary | ICD-10-CM

## 2024-11-15 DIAGNOSIS — R1314 Dysphagia, pharyngoesophageal phase: Secondary | ICD-10-CM | POA: Diagnosis present

## 2024-11-15 DIAGNOSIS — M1A9XX0 Chronic gout, unspecified, without tophus (tophi): Secondary | ICD-10-CM | POA: Diagnosis present

## 2024-11-15 DIAGNOSIS — R748 Abnormal levels of other serum enzymes: Secondary | ICD-10-CM

## 2024-11-15 DIAGNOSIS — Z21 Asymptomatic human immunodeficiency virus [HIV] infection status: Secondary | ICD-10-CM | POA: Diagnosis not present

## 2024-11-15 DIAGNOSIS — F32A Depression, unspecified: Secondary | ICD-10-CM | POA: Diagnosis present

## 2024-11-15 DIAGNOSIS — J4530 Mild persistent asthma, uncomplicated: Secondary | ICD-10-CM | POA: Diagnosis not present

## 2024-11-15 DIAGNOSIS — K7589 Other specified inflammatory liver diseases: Secondary | ICD-10-CM

## 2024-11-15 DIAGNOSIS — K831 Obstruction of bile duct: Secondary | ICD-10-CM

## 2024-11-15 DIAGNOSIS — E162 Hypoglycemia, unspecified: Principal | ICD-10-CM | POA: Insufficient documentation

## 2024-11-15 DIAGNOSIS — I5020 Unspecified systolic (congestive) heart failure: Secondary | ICD-10-CM | POA: Diagnosis present

## 2024-11-15 DIAGNOSIS — Z853 Personal history of malignant neoplasm of breast: Secondary | ICD-10-CM

## 2024-11-15 DIAGNOSIS — E236 Other disorders of pituitary gland: Secondary | ICD-10-CM | POA: Diagnosis present

## 2024-11-15 DIAGNOSIS — E2740 Unspecified adrenocortical insufficiency: Secondary | ICD-10-CM | POA: Insufficient documentation

## 2024-11-15 DIAGNOSIS — E785 Hyperlipidemia, unspecified: Secondary | ICD-10-CM | POA: Diagnosis present

## 2024-11-15 DIAGNOSIS — E559 Vitamin D deficiency, unspecified: Secondary | ICD-10-CM

## 2024-11-15 DIAGNOSIS — N186 End stage renal disease: Secondary | ICD-10-CM | POA: Diagnosis not present

## 2024-11-15 DIAGNOSIS — R7989 Other specified abnormal findings of blood chemistry: Secondary | ICD-10-CM | POA: Insufficient documentation

## 2024-11-15 DIAGNOSIS — D352 Benign neoplasm of pituitary gland: Secondary | ICD-10-CM | POA: Diagnosis present

## 2024-11-15 DIAGNOSIS — D649 Anemia, unspecified: Secondary | ICD-10-CM | POA: Diagnosis present

## 2024-11-15 DIAGNOSIS — J45909 Unspecified asthma, uncomplicated: Secondary | ICD-10-CM | POA: Diagnosis present

## 2024-11-15 LAB — COMPREHENSIVE METABOLIC PANEL WITH GFR
ALT: 78 U/L — ABNORMAL HIGH (ref 0–44)
AST: 103 U/L — ABNORMAL HIGH (ref 15–41)
Albumin: 2.3 g/dL — ABNORMAL LOW (ref 3.5–5.0)
Alkaline Phosphatase: 1218 U/L — ABNORMAL HIGH (ref 38–126)
Anion gap: 20 — ABNORMAL HIGH (ref 5–15)
BUN: 103 mg/dL — ABNORMAL HIGH (ref 6–20)
CO2: 23 mmol/L (ref 22–32)
Calcium: 8.9 mg/dL (ref 8.9–10.3)
Chloride: 88 mmol/L — ABNORMAL LOW (ref 98–111)
Creatinine, Ser: 9.69 mg/dL — ABNORMAL HIGH (ref 0.44–1.00)
GFR, Estimated: 4 mL/min — ABNORMAL LOW
Glucose, Bld: 49 mg/dL — ABNORMAL LOW (ref 70–99)
Potassium: 5.8 mmol/L — ABNORMAL HIGH (ref 3.5–5.1)
Sodium: 131 mmol/L — ABNORMAL LOW (ref 135–145)
Total Bilirubin: 0.8 mg/dL (ref 0.0–1.2)
Total Protein: 6.3 g/dL — ABNORMAL LOW (ref 6.5–8.1)

## 2024-11-15 LAB — CBC WITH DIFFERENTIAL/PLATELET
Abs Immature Granulocytes: 0.84 10*3/uL — ABNORMAL HIGH (ref 0.00–0.07)
Basophils Absolute: 0 10*3/uL (ref 0.0–0.1)
Basophils Relative: 0 %
Eosinophils Absolute: 0.1 10*3/uL (ref 0.0–0.5)
Eosinophils Relative: 1 %
HCT: 26.9 % — ABNORMAL LOW (ref 36.0–46.0)
Hemoglobin: 9.3 g/dL — ABNORMAL LOW (ref 12.0–15.0)
Immature Granulocytes: 6 %
Lymphocytes Relative: 12 %
Lymphs Abs: 1.5 10*3/uL (ref 0.7–4.0)
MCH: 35.2 pg — ABNORMAL HIGH (ref 26.0–34.0)
MCHC: 34.6 g/dL (ref 30.0–36.0)
MCV: 101.9 fL — ABNORMAL HIGH (ref 80.0–100.0)
Monocytes Absolute: 0.9 10*3/uL (ref 0.1–1.0)
Monocytes Relative: 7 %
Neutro Abs: 9.8 10*3/uL — ABNORMAL HIGH (ref 1.7–7.7)
Neutrophils Relative %: 74 %
Platelets: 142 10*3/uL — ABNORMAL LOW (ref 150–400)
RBC: 2.64 MIL/uL — ABNORMAL LOW (ref 3.87–5.11)
RDW: 16.4 % — ABNORMAL HIGH (ref 11.5–15.5)
Smear Review: NORMAL
WBC: 13.1 10*3/uL — ABNORMAL HIGH (ref 4.0–10.5)
nRBC: 4.7 % — ABNORMAL HIGH (ref 0.0–0.2)

## 2024-11-15 LAB — CBG MONITORING, ED
Glucose-Capillary: 41 mg/dL — CL (ref 70–99)
Glucose-Capillary: 88 mg/dL (ref 70–99)
Glucose-Capillary: 44 mg/dL — CL (ref 70–99)
Glucose-Capillary: 98 mg/dL (ref 70–99)

## 2024-11-15 LAB — MAGNESIUM: Magnesium: 2.1 mg/dL (ref 1.7–2.4)

## 2024-11-15 LAB — LIPASE, BLOOD: Lipase: 21 U/L (ref 11–51)

## 2024-11-15 LAB — GAMMA GT: GGT: 1582 U/L — ABNORMAL HIGH (ref 7–50)

## 2024-11-15 MED ORDER — ASPIRIN 81 MG PO CHEW
81.0000 mg | CHEWABLE_TABLET | Freq: Every day | ORAL | Status: DC
  Administered 2024-11-16 – 2024-11-20 (×5): 81 mg via ORAL
  Filled 2024-11-15 (×5): qty 1

## 2024-11-15 MED ORDER — PANTOPRAZOLE SODIUM 40 MG PO TBEC
40.0000 mg | DELAYED_RELEASE_TABLET | Freq: Every day | ORAL | Status: DC
  Administered 2024-11-16 – 2024-11-20 (×5): 40 mg via ORAL
  Filled 2024-11-15 (×5): qty 1

## 2024-11-15 MED ORDER — TRAZODONE HCL 50 MG PO TABS
25.0000 mg | ORAL_TABLET | Freq: Every evening | ORAL | Status: DC | PRN
  Administered 2024-11-15 – 2024-11-20 (×3): 25 mg via ORAL
  Filled 2024-11-15 (×4): qty 1

## 2024-11-15 MED ORDER — NEPRO/CARBSTEADY PO LIQD
1000.0000 mL | Freq: Every day | ORAL | Status: DC
  Administered 2024-11-15 – 2024-11-19 (×7): 1000 mL
  Filled 2024-11-15 (×5): qty 1000

## 2024-11-15 MED ORDER — ACETAMINOPHEN 650 MG RE SUPP: 650.0000 mg | Freq: Four times a day (QID) | RECTAL | Status: DC | PRN

## 2024-11-15 MED ORDER — ROSUVASTATIN CALCIUM 5 MG PO TABS
10.0000 mg | ORAL_TABLET | Freq: Every day | ORAL | Status: DC
  Administered 2024-11-16 – 2024-11-20 (×5): 10 mg via ORAL
  Filled 2024-11-15 (×5): qty 2

## 2024-11-15 MED ORDER — DEXTROSE 50 % IV SOLN
50.0000 mL | Freq: Once | INTRAVENOUS | Status: AC
  Administered 2024-11-15: 50 mL via INTRAVENOUS

## 2024-11-15 MED ORDER — HYDROCORTISONE 5 MG PO TABS
5.0000 mg | ORAL_TABLET | Freq: Every day | ORAL | Status: DC
  Administered 2024-11-16: 5 mg via ORAL
  Filled 2024-11-15: qty 1

## 2024-11-15 MED ORDER — HYDROCORTISONE 5 MG PO TABS
5.0000 mg | ORAL_TABLET | Freq: Every day | ORAL | Status: DC
  Filled 2024-11-15 (×2): qty 1

## 2024-11-15 MED ORDER — MIDODRINE HCL 5 MG PO TABS
5.0000 mg | ORAL_TABLET | Freq: Three times a day (TID) | ORAL | Status: DC
  Administered 2024-11-15 – 2024-11-17 (×6): 5 mg via ORAL
  Filled 2024-11-15 (×6): qty 1

## 2024-11-15 MED ORDER — RENA-VITE PO TABS
1.0000 | ORAL_TABLET | Freq: Every day | ORAL | Status: DC
  Administered 2024-11-15 – 2024-11-20 (×6): 1 via ORAL
  Filled 2024-11-15 (×6): qty 1

## 2024-11-15 MED ORDER — POLYETHYLENE GLYCOL 3350 17 G PO PACK: 17.0000 g | PACK | Freq: Every day | ORAL | Status: DC | PRN

## 2024-11-15 MED ORDER — GENTAMICIN SULFATE 0.1 % EX CREA
1.0000 | TOPICAL_CREAM | Freq: Every day | CUTANEOUS | Status: AC
  Administered 2024-11-16 – 2024-11-22 (×7): 1 via TOPICAL
  Filled 2024-11-15: qty 15

## 2024-11-15 MED ORDER — GENTAMICIN SULFATE 0.1 % EX CREA: 1.0000 | TOPICAL_CREAM | Freq: Every day | CUTANEOUS | Status: AC | PRN

## 2024-11-15 MED ORDER — DELFLEX-LC/2.5% DEXTROSE 394 MOSM/L IP SOLN: INTRAPERITONEAL | Status: DC

## 2024-11-15 MED ORDER — SODIUM CHLORIDE 0.9% FLUSH
3.0000 mL | Freq: Two times a day (BID) | INTRAVENOUS | Status: AC
  Administered 2024-11-15 – 2024-11-22 (×14): 3 mL via INTRAVENOUS

## 2024-11-15 MED ORDER — DEXTROSE 50 % IV SOLN
1.0000 | Freq: Once | INTRAVENOUS | Status: AC
  Administered 2024-11-15: 50 mL via INTRAVENOUS

## 2024-11-15 MED ORDER — SODIUM ZIRCONIUM CYCLOSILICATE 10 G PO PACK
10.0000 g | PACK | Freq: Once | ORAL | Status: AC
  Administered 2024-11-15: 10 g via ORAL
  Filled 2024-11-15: qty 1

## 2024-11-15 MED ORDER — EMTRICITABINE-TENOFOVIR AF 200-25 MG PO TABS
1.0000 | ORAL_TABLET | Freq: Every day | ORAL | Status: DC
  Administered 2024-11-16 – 2024-11-20 (×5): 1 via ORAL
  Filled 2024-11-15 (×6): qty 1

## 2024-11-15 MED ORDER — ACETAMINOPHEN 325 MG PO TABS
650.0000 mg | ORAL_TABLET | Freq: Four times a day (QID) | ORAL | Status: DC | PRN
  Administered 2024-11-16 – 2024-11-20 (×4): 650 mg via ORAL
  Filled 2024-11-15 (×4): qty 2

## 2024-11-15 MED ORDER — DOLUTEGRAVIR SODIUM 50 MG PO TABS
50.0000 mg | ORAL_TABLET | Freq: Every day | ORAL | Status: DC
  Administered 2024-11-16 – 2024-11-20 (×5): 50 mg via ORAL
  Filled 2024-11-15 (×6): qty 1

## 2024-11-15 MED ORDER — ALLOPURINOL 100 MG PO TABS
100.0000 mg | ORAL_TABLET | Freq: Every day | ORAL | Status: DC
  Administered 2024-11-16 – 2024-11-20 (×5): 100 mg via ORAL
  Filled 2024-11-15 (×5): qty 1

## 2024-11-15 MED ORDER — HEPARIN SODIUM (PORCINE) 5000 UNIT/ML IJ SOLN
5000.0000 [IU] | Freq: Three times a day (TID) | INTRAMUSCULAR | Status: DC
  Administered 2024-11-15 – 2024-11-17 (×6): 5000 [IU] via SUBCUTANEOUS
  Filled 2024-11-15 (×7): qty 1

## 2024-11-15 MED ORDER — DELFLEX-LC/1.5% DEXTROSE 344 MOSM/L IP SOLN: INTRAPERITONEAL | Status: DC

## 2024-11-15 MED ORDER — LEVOTHYROXINE SODIUM 25 MCG PO TABS
125.0000 ug | ORAL_TABLET | Freq: Every day | ORAL | Status: DC
  Administered 2024-11-16 – 2024-11-21 (×6): 125 ug via ORAL
  Filled 2024-11-15 (×6): qty 1

## 2024-11-15 MED ORDER — ALBUTEROL SULFATE (2.5 MG/3ML) 0.083% IN NEBU: 2.5000 mg | INHALATION_SOLUTION | RESPIRATORY_TRACT | Status: AC | PRN

## 2024-11-15 MED ORDER — BUSPIRONE HCL 10 MG PO TABS
10.0000 mg | ORAL_TABLET | Freq: Two times a day (BID) | ORAL | Status: DC
  Administered 2024-11-15 – 2024-11-20 (×11): 10 mg via ORAL
  Filled 2024-11-15 (×11): qty 1

## 2024-11-15 MED ORDER — CALCIUM GLUCONATE-NACL 1-0.675 GM/50ML-% IV SOLN
1.0000 g | Freq: Once | INTRAVENOUS | Status: AC
  Administered 2024-11-15: 1000 mg via INTRAVENOUS
  Filled 2024-11-15: qty 50

## 2024-11-15 MED ORDER — HYDROCORTISONE 5 MG PO TABS
5.0000 mg | ORAL_TABLET | Freq: Two times a day (BID) | ORAL | Status: DC
  Filled 2024-11-15 (×2): qty 3

## 2024-11-15 MED ORDER — DEXTROSE 10 % IV SOLN: INTRAVENOUS | Status: DC

## 2024-11-15 MED ORDER — MIRTAZAPINE 15 MG PO TABS
15.0000 mg | ORAL_TABLET | Freq: Every day | ORAL | Status: DC
  Administered 2024-11-15 – 2024-11-20 (×6): 15 mg via ORAL
  Filled 2024-11-15 (×6): qty 1

## 2024-11-15 NOTE — Transitions of Care (Post Inpatient/ED Visit) (Signed)
" ° °  11/15/2024  Name: Vielka Klinedinst MRN: [id number] DOB: 14-Jun-1966  Today's TOC FU Call Status: Today's TOC FU Call Status:: Unsuccessful Call (1st Attempt) Unsuccessful Call (1st Attempt) Date: 11/15/24  Attempted to reach the patient regarding the most recent Inpatient/ED visit.  Follow Up Plan: Additional outreach attempts will be made to reach the patient to complete the Transitions of Care (Post Inpatient/ED visit) call.    Olam Ku, RN, BSN [place]  [facility], [facility] RN Care Manager Direct Dial: [phone]  Fax: [phone]   "

## 2024-11-15 NOTE — ED Triage Notes (Signed)
 Pt BIB [facility] from home d/t family wanting her to be re-evaluated since she has been feeling increase fatigue & weak since getting d/c from [facility] 2 days ago with an NG tube for her poor oral intake.  EMS triage:  80 bpm 126/82 17 Resp 98% on RA 58 CBG initially then drank OJ & was then 76 New dialysis pt.

## 2024-11-15 NOTE — ED Notes (Signed)
 Patients CBG 44 notified RN

## 2024-11-15 NOTE — ED Provider Notes (Signed)
 "  Emergency Department Provider Note   I have reviewed the triage vital signs and the nursing notes.   HISTORY  Chief Complaint No chief complaint on file.   HPI [name] is a 59 y.o. female presents to the emergency department for evaluation of generalized weakness, inability to walk, hypoglycemia.  She has a complex medical history including prolonged poor appetite, dysphagia, and ESRD on peritoneal dialysis.  She was discharged from [facility] where NG tube was placed and she is currently on night feedings.  She did a feeding last night but had no improvement in her symptoms.  No fevers, chest pain, abdominal pain.   Past Medical History:  Diagnosis Date   Acute pain of right shoulder due to trauma 06/08/2023   Acute pancreatitis 10/23/2021   Acute renal failure superimposed on stage 4 chronic kidney disease (HCC) 10/24/2021   Allergy    Anemia    Normocytic   Antibiotic-induced yeast infection 09/28/2020   Anxiety    Asthma    Bronchitis 2005   Bursitis of left shoulder 09/06/2019   CKD (chronic kidney disease)    Dialysis on Mon-Wed- Fri   CLASS 1-EXOPHTHALMOS-THYROTOXIC 02/08/2007   Cognitive dysfunction 02/21/2020   COVID-19 [place] Pucci hauler 05/11/2021   COVID-19 virus infection 04/28/2022   Diabetes mellitus without complication (HCC)    type 2   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  Genes Analyzed (43 total): APC*, ATM*, AXIN2, BARD1, BMPR1A, BRCA1*, BRCA2*, BRIP1*, CDH1*, CDK4, CDKN2A, CHEK2*, DICER1,  GALNT12, HOXB13, MEN1, MLH1*, MRE11A, MSH2*, MSH3, MSH6*, MUTYH*, NBN, NF1*, NTHL1, PALB2*, PMS2*, POLD1, POLE, PTEN*, RAD50, RAD51C*, RAD51D*, RET, SDHB, SDHD, SMAD4, SMARCA4, STK11 and TP53* (sequencing and deletion/duplication); EGFR (s   GERD 07/24/2006   GRAVE'S DISEASE 01/01/2008   History of hidradenitis suppurativa    History of kidney stones    History of thrush    HIV DISEASE 07/24/2006   dx March 05   Hyperlipidemia    HYPERTENSION 07/24/2006   Hyperthyroidism 08/2006   Grave's Disease -diffuse radiotracer uptake 08/25/06 Thyroid  scan-Cold nodule to R lower lobe of thyrorid   Ileus (HCC) 02/14/2023   Left bundle branch block (LBBB)    Malnutrition of moderate degree 02/08/2023   Menometrorrhagia    hx of   Nephrolithiasis    Panniculitis 05/12/2020   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 Completed 2 yrs Prednisone  after lung bx confirmation. No symptoms since then.   Sebaceous  cyst 04/21/2020   Suppurative hidradenitis    Thyroid  cancer (HCC)    THYROID  NODULE, RIGHT 02/08/2007    Review of Systems  Constitutional: No fever/chills; generalized weakness. Cardiovascular: Denies chest pain. Respiratory: Denies shortness of breath. Gastrointestinal: No abdominal pain.  No nausea, no vomiting.   Neurological: Negative for headaches. ____________________________________________   PHYSICAL  EXAM:  VITAL SIGNS: ED Triage Vitals  Encounter Vitals Group     BP 11/15/24 1210 (!) 107/50     Pulse Rate 11/15/24 1233 80     Resp 11/15/24 1210 20     Temp 11/15/24 1210 97.7 F (36.5 C)     Temp Source 11/15/24 1210 Oral     SpO2 11/15/24 1233 100 %   Constitutional: Alert and oriented. Well appearing and in no acute distress. Eyes: Conjunctivae are normal.  Head: Atraumatic. Nose: No congestion/rhinnorhea. Mouth/Throat: Mucous membranes are moist.   Neck: No stridor.   Cardiovascular: Normal rate, regular rhythm. Good peripheral circulation. Grossly normal heart sounds.   Respiratory: Normal respiratory effort.  No retractions. Lungs CTAB. Gastrointestinal: Soft and nontender. No distention. PD catheter site is well appearing.  Musculoskeletal: No gross deformities of extremities. Neurologic:  Normal speech and language.  Skin:  Skin is warm, dry and intact. No rash noted.   ____________________________________________   LABS (all labs ordered are listed, but only abnormal results are displayed)  Labs Reviewed  CBC WITH DIFFERENTIAL/PLATELET - Abnormal; Notable for the following components:      Result Value   RBC 2.64 (*)    Hemoglobin 9.3 (*)    HCT 26.9 (*)    MCV 101.9 (*)    MCH 35.2 (*)    RDW 16.4 (*)    Platelets 142 (*)    All other components within normal limits  CBG MONITORING, ED - Abnormal; Notable for the following components:   Glucose-Capillary 41 (*)    All other components within normal limits  COMPREHENSIVE METABOLIC PANEL WITH GFR  LIPASE, BLOOD  MAGNESIUM   CBG MONITORING, ED   ____________________________________________  RADIOLOGY  No results found.  ____________________________________________   PROCEDURES  Procedure(s) performed:   Procedures  CRITICAL CARE Performed by: Fonda KANDICE Law Total critical care time: 35 minutes Critical care time was exclusive of separately billable procedures and treating other  patients. Critical care was necessary to treat or prevent imminent or life-threatening deterioration. Critical care was time spent personally by me on the following activities: development of treatment plan with patient and/or surrogate as well as nursing, discussions with consultants, evaluation of patient's response to treatment, examination of patient, obtaining history from patient or surrogate, ordering and performing treatments and interventions, ordering and review of laboratory studies, ordering and review of radiographic studies, pulse oximetry and re-evaluation of patient's condition.  Fonda Law, MD Emergency Medicine  ____________________________________________   INITIAL IMPRESSION / ASSESSMENT AND PLAN / ED COURSE  Pertinent labs & imaging results that were available during my care of the patient were reviewed by me and considered in my medical decision making (see chart for details).   This patient is Presenting for Evaluation of weakness, which does require a range of treatment options, and is a complaint that involves a high risk of morbidity and mortality.  The Differential Diagnoses include electrolyte derangement, AKI, peritonitis, sepsis, etc.  Critical Interventions-    Medications  dextrose  10 % infusion (has no administration in time range)  dextrose  50 % solution 50 mL (50 mLs Intravenous Given 11/15/24 1225)  Reassessment after intervention:     I did obtain Additional Historical Information from EMS.   Clinical Laboratory Tests Ordered, included CBC with mild anemia to 9.3.  Potassium 5.8 with creatinine 9.6 .  Alkaline phosphatase very elevated but normal bilirubin. Mg and Lipase negative.   Cardiac Monitor Tracing which shows NSR.    Social Determinants of Health Risk patient is a non-smoker.   Consult complete with  Medical Decision Making: Summary:  The patient presents emergency room with generalized weakness, hypoglycemia, hyperkalemia.   Recently discharged from [facility] currently doing nighttime feeding with NG tube in place.  Abdomen soft.  Doubt peritonitis.  She does have hyperkalemia here after missing her peritoneal dialysis last night.  Will discuss with nephrology. No HD access.   Reevaluation with update and discussion with   ***Considered admission***  Patient's presentation is most consistent with acute presentation with potential threat to life or bodily function.   Disposition:   ____________________________________________  FINAL CLINICAL IMPRESSION(S) / ED DIAGNOSES  Final diagnoses:  None     NEW OUTPATIENT MEDICATIONS STARTED DURING THIS VISIT:  New Prescriptions   No medications on file    Note:  This document was prepared using Dragon voice recognition software and may include unintentional dictation errors.  Fonda Law, MD, [facility] Emergency Medicine  "

## 2024-11-15 NOTE — Progress Notes (Signed)
 Pt awake and oriented.  Changed the dressing and the end of the catheter to baxter aseptically. Started PD with no complications.

## 2024-11-15 NOTE — H&P (Addendum)
 " History and Physical   Sheriece Jefcoat FMW:[id number] DOB: 11-Sep-1966 DOA: 11/15/2024  PCP: Gretta Comer POUR, NP   Patient coming from: Home  Chief Complaint: Weakness and fatigue  HPI: Bayan Hedstrom is a 59 y.o. female with medical history significant of recent prolonged admission at [facility] discharged yesterday, hypertension, hyperlipidemia, diabetes, hypothyroidism, HIV, ESRD on PD, chronic systolic CHF, anxiety, depression, dysphagia, aortic aneurysm, aortic dissection status post repair, pituitary apoplexy, adrenal insufficiency, sarcoid, anemia, breast cancer, gout, asthma presenting with weakness and fatigue.  Patient was discharged yesterday after prolonged admission at [facility].  Patient was admitted on 1/13-1/29.  Patient presented for failure to thrive withongoing dysphagia and decreased appetite.  Patient had several months of preceding dysphagia and decreased appetite leading to malnutrition.  Had had prior EGD which was largely normal patient received empiric esophageal dilation but had continued dysphagia.  Barium swallow there on 1/21 showed mild dysmotility and barium tablet got lodged at the GE junction.  SLP evaluation recommended regular diet with thin liquids as tolerated.  Due to ongoing issues with malnutrition feeding tube was placed and ultimately a Dobbhoff tube was placed.  Patient remains on tube feeds.  GI was consulted and plan for outpatient manometry.  Hospitalization also included evaluation of graft for prior aortic dissection repair.  This was done out of concern for possible decreased perfusion to her mesentery in the setting of prior aneurysm repair and low output heart failure.  Graft was patent and perfusing vessels appropriately.  Repeat echocardiogram showed continued low output heart failure with reduction in EF to 20%, stable moderate mitral regurgitation.  Patient had evaluation by endocrine there for her history of  pituitary apoplexy.  ACTH  stim test was within expected range and patient had weaned off her steroid down to hydrocortisone  15 mg morning and 5 mg in the evening.Synthroid  dose also reduced to 125 mcg daily.  They plan for outpatient follow-up with endocrinology.  Patient also had changes at PD catheter exit site concerning for exit site infection and completed 10-day course of Keflex .  Peritoneal fluid showed no evidence of infection while admitted at [facility].  Patient returned home yesterday.  Reports that she did start due to her tube feed overnight, but daughter reports when she checked it this morning only 300 cc of the 1000 cc had gone in.  Reports that she did not do her PD overnight last night.  Continue to have significant weakness and fatigue and presented to the ED for further evaluation.  Denies fevers, chills, chest pain, shortness of breath, abdominal pain, constipation, diarrhea, nausea, vomiting.  ED Course: Vital signs in the ED notable for blood pressure in the 90s-110 systolic (single reading in the upper 80s systolic, possibly spurious.).  Lab workup included CMP with sodium 131, potassium 5.8, chloride 88, bicarb 23, BUN 103, creatinine 9.69, glucose 49, protein 6.3, albumin  2.3, AST 103, ALT 78, alk phos 1218 with normal T. bili.  Lipase normal.  Magnesium  normal.  No additional imaging thus far in the ED.  Patient received Lokelma , dextrose , calcium  gluconate for recurrent hypoglycemia.  Started on a D10 infusion.  Nephrology consulted for PD and hyperkalemia.  Review of Systems: As per HPI otherwise all other systems reviewed and are negative.  Past Medical History:  Diagnosis Date   Acute pain of right shoulder due to trauma 06/08/2023   Acute pancreatitis 10/23/2021   Acute renal failure superimposed on stage 4 chronic kidney disease (HCC) 10/24/2021  Allergy    Anemia    Normocytic   Antibiotic-induced yeast infection 09/28/2020   Anxiety    Asthma    Bronchitis  2005   Bursitis of left shoulder 09/06/2019   CKD (chronic kidney disease)    Dialysis on Mon-Wed- Fri   CLASS 1-EXOPHTHALMOS-THYROTOXIC 02/08/2007   Cognitive dysfunction 02/21/2020   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  Genes Analyzed (43 total): APC*, ATM*, AXIN2, BARD1, BMPR1A, BRCA1*, BRCA2*, BRIP1*, CDH1*, CDK4, CDKN2A, CHEK2*, DICER1, GALNT12, HOXB13, MEN1, MLH1*, MRE11A, MSH2*, MSH3, MSH6*, MUTYH*, NBN, NF1*, NTHL1, PALB2*, PMS2*, POLD1, POLE, PTEN*, RAD50, RAD51C*, RAD51D*, RET, SDHB, SDHD, SMAD4, SMARCA4, STK11 and TP53* (sequencing and deletion/duplication); EGFR (s   GERD 07/24/2006   GRAVE'S DISEASE 01/01/2008   History of hidradenitis suppurativa    History of kidney stones    History of thrush    HIV DISEASE 07/24/2006   dx March 05   Hyperlipidemia    HYPERTENSION 07/24/2006   Hyperthyroidism 08/2006   Grave's Disease -diffuse radiotracer uptake 08/25/06 Thyroid  scan-Cold nodule to R lower lobe of thyrorid   Ileus (HCC) 02/14/2023   Left bundle branch block (LBBB)    Malnutrition of moderate degree 02/08/2023   Menometrorrhagia    hx of   Nephrolithiasis    Panniculitis 05/12/2020   Papillary adenocarcinoma of thyroid  (HCC)    METASTATIC PAPILLARY THYROID  CARCINOMA per 01/12/17 FNA left cervical LN; s/p completion thyroidectomy, limited left neck dissection 04/12/17 with pathology  negative for malignancy.   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 Completed 2 yrs Prednisone  after lung bx confirmation. No symptoms since then.   Sebaceous cyst 04/21/2020   Shock (HCC) 09/23/2024   Suppurative hidradenitis    Thyroid  cancer (HCC)    THYROID  NODULE, RIGHT 02/08/2007    Past Surgical History:  Procedure Laterality Date   AORTIC ARCH DEBRANCHING N/A 08/25/2023   Procedure: AORTIC ARCH DEBRANCHING USING 12X8X8MM HEMASHIELD GRAFT;  Surgeon: Lucas Dorise POUR, MD;  Location: [facility];  Service: Open Heart Surgery;  Laterality: N/A;  with bypasses to the innominate and left common carotid arteries   APPLICATION OF WOUND VAC N/A 01/20/2021   Procedure: APPLICATION OF WOUND VAC;  Surgeon: Lowery Estefana RAMAN, DO;  Location: [facility];  Service: Plastics;  Laterality: N/A;   BREAST EXCISIONAL BIOPSY Right 04/26/2018   right axilla negative   BREAST EXCISIONAL BIOPSY Left 04/26/2018   left axilla negative   BREAST LUMPECTOMY Right 10/03/2018  malignant   BREAST LUMPECTOMY WITH RADIOACTIVE SEED AND SENTINEL LYMPH NODE BIOPSY Right 10/03/2018   Procedure: RIGHT BREAST LUMPECTOMY WITH RADIOACTIVE SEED AND SENTINEL LYMPH NODE MAPPING;  Surgeon: Vanderbilt Ned, MD;  Location: [facility];  Service: General;  Laterality: Right;   BREAST SURGERY  1997   Breast Reduction    CAPD INSERTION N/A 12/21/2023   Procedure: LAPAROSCOPIC INSERTION CONTINUOUS AMBULATORY PERITONEAL DIALYSIS  (CAPD) CATHETER; LAPAROSCOPIC OMENTUMPEXY;  Surgeon: Magda Ned SAILOR, MD;  Location: [facility];  Service: Vascular;   Laterality: N/A;   CYSTOSCOPY W/ URETERAL STENT REMOVAL  11/09/2012   Procedure: CYSTOSCOPY WITH STENT REMOVAL;  Surgeon: Ricardo Likens, MD;  Location: [facility];  Service: Urology;  Laterality: Right;   CYSTOSCOPY WITH RETROGRADE PYELOGRAM, URETEROSCOPY AND STENT PLACEMENT  11/09/2012   Procedure: CYSTOSCOPY WITH RETROGRADE PYELOGRAM, URETEROSCOPY AND STENT PLACEMENT;  Surgeon: Ricardo Likens, MD;  Location: [facility];  Service: Urology;  Laterality: Left;  LEFT URETEROSCOPY, STONE MANIPULATION, left STENT exchange    CYSTOSCOPY WITH STENT PLACEMENT  10/02/2012   Procedure: CYSTOSCOPY WITH STENT PLACEMENT;  Surgeon: Ricardo Likens, MD;  Location: [facility];  Service: Urology;  Laterality: Left;   DEBRIDEMENT AND CLOSURE WOUND N/A 01/20/2021   Procedure: Excision of abdominal wound with closure;  Surgeon: Lowery Estefana RAMAN, DO;  Location: [facility];  Service: Plastics;  Laterality: N/A;   DILATION AND CURETTAGE OF UTERUS  11/2002   s/p for 1st trimester nonviable pregnancy   ESOPHAGOGASTRODUODENOSCOPY N/A 09/11/2024   Procedure: EGD (ESOPHAGOGASTRODUODENOSCOPY);  Surgeon: Abran Norleen SAILOR, MD;  Location[facility];  Service: Gastroenterology;  Laterality: N/A;   EYE SURGERY     sty under eyelid   INCISE AND DRAIN ABCESS  08/2002   s/p I &D for righ inframmary fold hidradenitis   INCISION AND DRAINAGE PERITONSILLAR ABSCESS  12/2001   IR CV LINE INJECTION  06/07/2018   IR FLUORO GUIDE CV LINE RIGHT  01/30/2023   IR IMAGING GUIDED PORT INSERTION  06/20/2018   IR REMOVAL TUN ACCESS W/ PORT W/O FL MOD SED  06/20/2018   IR US  GUIDE VASC ACCESS RIGHT  01/30/2023   IRRIGATION AND DEBRIDEMENT ABSCESS  01/31/2012   Procedure: IRRIGATION AND DEBRIDEMENT ABSCESS;  Surgeon: Alm VEAR Angle, MD;  [facility]: [facility];  Service: General;  Laterality: Right;  right breast and axilla    LAPAROSCOPIC REPOSITIONING CAPD CATHETER Left 01/10/2024   Procedure: Removal of peritoneal dialysis Catheter.;   Surgeon: Magda Ned SAILOR, MD;  Location: [facility];  Service: Vascular;  Laterality: Left;   LAPAROSCOPY N/A 01/10/2024   Procedure: LAPAROSCOPY, DIAGNOSTIC;  Surgeon: Magda Ned SAILOR, MD;  Location: [facility] OR;  Service: Vascular;  Laterality: N/A;   NEPHROLITHOTOMY  10/02/2012   Procedure: NEPHROLITHOTOMY PERCUTANEOUS;  Surgeon: Ricardo Likens, MD;  [facility]: [facility];  Service: Urology;  Laterality: Right;  First Stage Percutaneous Nephrolithotomy with Surgeon Access, Left Ureteral Stent     NEPHROLITHOTOMY  10/04/2012   Procedure: NEPHROLITHOTOMY PERCUTANEOUS SECOND LOOK;  Surgeon: Ricardo Likens, MD;  [facility]: [facility];  Service: Urology;  Laterality: Right;      NEPHROLITHOTOMY  10/08/2012   Procedure: NEPHROLITHOTOMY PERCUTANEOUS;  Surgeon: Ricardo Likens, MD;  [facility]: [facility];  Service: Urology;  Laterality: Right;  THIRD STAGE, nephrostomy tube exchange x 2   NEPHROLITHOTOMY  10/11/2012   Procedure: NEPHROLITHOTOMY PERCUTANEOUS SECOND LOOK;  Surgeon: Ricardo Likens, MD;  [facility]: [facility];  Service: Urology;  Laterality: Right;  RIGHT 4 STAGE PERCUTANOUS NEPHROLITHOTOMY, right URETEROSCOPY WITH  HOLMIUM LASER    PANNICULECTOMY N/A 12/21/2020   Procedure: PANNICULECTOMY;  Surgeon: Lowery Estefana RAMAN, DO;  Location: [facility];  Service: Plastics;  Laterality: N/A;   PERCUTANEOUS NEPHROSTOLITHOTOMY  04/2022   PORT-A-CATH REMOVAL N/A 07/16/2020   Procedure: REMOVAL PORT-A-CATH;  Surgeon: Vanderbilt Ned, MD;  Location: [facility];  Service: General;  Laterality: N/A;   PORTACATH PLACEMENT Left 05/17/2018   Procedure: INSERTION PORT-A-CATH;  Surgeon: Vernetta Berg, MD;  Location: [facility];  Service: General;  Laterality: Left;   RADICAL NECK DISSECTION  04/12/2017   limited/notes 04/12/2017   RADICAL NECK DISSECTION N/A 04/12/2017   Procedure: RADICAL NECK DISSECTION;  Surgeon: Carlie Clark, MD;  Location: [facility] OR;  Service: ENT;  Laterality: N/A;  limited neck  dissection 2 hours total   REDUCTION MAMMAPLASTY Bilateral 1998   RIGHT/LEFT HEART CATH AND CORONARY ANGIOGRAPHY N/A 03/12/2020   Procedure: RIGHT/LEFT HEART CATH AND CORONARY ANGIOGRAPHY;  Surgeon: Cherrie Toribio SAUNDERS, MD;  [facility];  Service: Cardiovascular;  Laterality: N/A;   RIGHT/LEFT HEART CATH AND CORONARY ANGIOGRAPHY N/A 09/26/2024   Procedure: RIGHT/LEFT HEART CATH AND CORONARY ANGIOGRAPHY;  Surgeon: Darron Deatrice LABOR, MD;  [facility];  Service: Cardiovascular;  Laterality: N/A;   [place]  1994   TEE WITHOUT CARDIOVERSION N/A 08/25/2023   Procedure: TRANSESOPHAGEAL ECHOCARDIOGRAM;  Surgeon: Lucas Dorise POUR, MD;  [facility];  Service: Open Heart Surgery;  Laterality: N/A;   TENCKHOFF CATHETER INSERTION Left 01/10/2024   Procedure: INSERTION, CATHETER, DIALYSIS, PERITONEAL;  Surgeon: Magda Ned SAILOR, MD;  Location: [facility];  Service: Vascular;  Laterality: Left;   THORACIC AORTIC ENDOVASCULAR STENT GRAFT N/A 02/02/2023   Procedure: THORACIC AORTIC ENDOVASCULAR STENT GRAFT;  Surgeon: Lanis Fonda BRAVO, MD;  Location: [facility] OR;  Service: Vascular;  Laterality: N/A;   THORACIC AORTIC ENDOVASCULAR STENT GRAFT N/A 08/25/2023   Procedure: THORACIC AORTIC ENDOVASCULAR STENT GRAFT;  Surgeon: Lanis Fonda BRAVO, MD;  Location: [facility] OR;  Service: Vascular;  Laterality: N/A;   THYROIDECTOMY  04/12/2017   completion/notes 04/12/2017   THYROIDECTOMY N/A 04/12/2017   Procedure: THYROIDECTOMY;  Surgeon: Carlie Clark, MD;  Location: [facility] OR;  Service: ENT;  Laterality: N/A;  Completion Thyroidectomy   TOTAL THYROIDECTOMY  2010   ULTRASOUND GUIDANCE FOR VASCULAR ACCESS Right 08/25/2023   Procedure: ULTRASOUND GUIDANCE FOR VASCULAR ACCESS, RIGHT FEMORAL ARTERY;  Surgeon: Lanis Fonda BRAVO, MD;  Location: [facility] OR;  Service: Vascular;  Laterality: Right;    Social History  reports that she quit smoking about 7 years ago. Her smoking use included cigarettes. She started smoking  about 7 years ago. She has a 7.5 pack-year smoking history. She has never used smokeless tobacco. She reports that she does not currently use alcohol . She reports that she does not use drugs.  Allergies[1]  Family History  Problem Relation Age of Onset   Hypertension Mother    Cancer Mother        laryngeal   Heart disease Mother        stent   Pancreatic cancer Father    Hypertension Father    Lung cancer Father 87       hx smoking   Hypertension Sister    Hypertension Sister    Hypertension Brother    Breast cancer Maternal Aunt 69   Breast cancer Maternal Aunt        dx 60+   Cancer Maternal Uncle        Lung  CA   Breast cancer Paternal Aunt 76   Breast cancer Paternal Aunt        dx 69's   Breast cancer Paternal Aunt        dx 50's   Prostate cancer Paternal Uncle    Prostate cancer Paternal Uncle    Lung cancer Paternal Uncle    Breast cancer Cousin 60   Breast cancer Cousin        dx <50   Breast cancer Cousin        dx <50   Breast cancer Cousin        dx <50   Heart disease Other    Hypertension Other    Stroke Other        Grandparent   Kidney disease Other        Grandparent   Diabetes Other        FH of Diabetes   Colon cancer Neg Hx    Esophageal cancer Neg Hx    Rectal cancer Neg Hx    Stomach cancer Neg Hx   Reviewed on admission  Prior to Admission medications  Medication Sig Start Date End Date Taking? Authorizing Provider  acetaminophen  (TYLENOL ) 325 MG tablet Take 1-2 tablets (325-650 mg total) by mouth every 4 (four) hours as needed for mild pain (pain score 1-3). 10/07/24   Jerilynn Daphne SAILOR, NP  allopurinol  (ZYLOPRIM ) 100 MG tablet TAKE 1 TABLET (100 MG TOTAL) BY MOUTH DAILY. FOR GOUT PREVENTION 04/11/24   Clark, Katherine K, NP  aspirin  81 MG chewable tablet Chew 1 tablet (81 mg total) by mouth daily. 10/07/24   Jerilynn Daphne SAILOR, NP  busPIRone  (BUSPAR ) 5 MG tablet Take 2 tablets (10 mg total) by mouth 2 (two) times daily. 10/07/24    Jerilynn Daphne SAILOR, NP  calcitRIOL  (ROCALTROL ) 0.25 MCG capsule Take 0.25 mcg by mouth 3 (three) times a week. 08/07/24   [provider]  Darbepoetin Alfa  (ARANESP ) 100 MCG/0.5ML SOSY injection Inject 0.5 mLs (100 mcg total) into the skin every Monday at 6 PM. 10/07/24   Jerilynn Daphne SAILOR, NP  dolutegravir  ([name] ) 50 MG tablet Take 1 tablet (50 mg total) by mouth daily. 05/13/24   Melvenia Corean SAILOR, NP  emtricitabine -tenofovir  AF (DESCOVY ) 200-25 MG tablet Take 1 tablet by mouth daily. 05/13/24   Melvenia Corean SAILOR, NP  famotidine  (PEPCID ) 10 MG tablet Take 1 tablet (10 mg total) by mouth daily. 10/08/24   Jerilynn Daphne SAILOR, NP  feeding supplement (ENSURE PLUS HIGH PROTEIN) LIQD Take 237 mLs by mouth 2 (two) times daily between meals. 10/07/24   Jerilynn Daphne SAILOR, NP  fludrocortisone  (FLORINEF ) 0.1 MG tablet Take 1 tablet (0.1 mg total) by mouth daily. 10/07/24   Jerilynn Daphne SAILOR, NP  fluticasone  (FLOVENT  HFA) 110 MCG/ACT inhaler Inhale 1 puff into the lungs 2 (two) times daily.    [provider]  hydrocortisone  (CORTEF ) 5 MG tablet Take 3 tablets (15 mg total) by mouth 2 (two) times daily. 10/07/24   Jerilynn Daphne SAILOR, NP  Lancets [facility] ULTRASOFT) lancets Use as instructed to test blood sugar daily 02/15/24   Clark, Katherine K, NP  levocetirizine (XYZAL ) 5 MG tablet TAKE 1 TABLET BY MOUTH EVERY DAY IN THE EVENING FOR ALLERGIES 05/12/24   Clark, Katherine K, NP  levothyroxine  (SYNTHROID ) 150 MCG tablet TAKE 1 TABLET BY MOUTH EVERY DAY BEFORE BREAKFAST 10/14/24   Trixie File, MD  lidocaine  (LIDODERM ) 5 % PLACE 1 PATCH ONTO THE SKIN DAILY. REMOVE &  DISCARD PATCH WITHIN 12 HOURS OR AS DIRECTED BY MD Patient taking differently: Place 1 patch onto the skin daily as needed. Remove & Discard patch within 12 hours or as directed by MD 04/21/24   Gretta Comer POUR, NP  mirtazapine  (REMERON ) 15 MG tablet Take 1 tablet (15 mg total) by mouth at bedtime. For appetite. 10/25/24    Clark, Katherine K, NP  multivitamin (RENA-VIT) TABS tablet Take 1 tablet by mouth at bedtime. 10/07/24   Jerilynn Daphne SAILOR, NP  omeprazole  (PRILOSEC) 40 MG capsule Take 1 capsule (40 mg total) by mouth 2 (two) times daily. for heartburn. 09/12/24   Ghimire, Donalda HERO, MD  ondansetron  (ZOFRAN -ODT) 4 MG disintegrating tablet Take 1 tablet (4 mg total) by mouth every 8 (eight) hours as needed for nausea or vomiting. 10/25/24   Clark, Katherine K, NP  oxyCODONE  (OXY IR/ROXICODONE ) 5 MG immediate release tablet Take 1 tablet (5 mg total) by mouth every 6 (six) hours as needed for moderate pain (pain score 4-6). 10/07/24   Jerilynn Daphne SAILOR, NP  rosuvastatin  (CRESTOR ) 10 MG tablet TAKE 1 TABLET BY MOUTH EVERY DAY FOR CHOLESTEROL 03/21/24   Clark, Katherine K, NP  saccharomyces boulardii (FLORASTOR) 250 MG capsule Take 1 capsule (250 mg total) by mouth daily. 10/08/24   Jerilynn Daphne SAILOR, NP  sevelamer  carbonate (RENVELA ) 800 MG tablet Take 2 tablets (1,600 mg total) by mouth 3 (three) times daily with meals. 10/07/24   Jerilynn Daphne SAILOR, NP  traZODone  (DESYREL ) 50 MG tablet Take 0.5 tablets (25 mg total) by mouth at bedtime as needed for sleep. 10/07/24   Jerilynn Daphne SAILOR, NP  budesonide  (PULMICORT ) 0.25 MG/2ML nebulizer solution Take 2 mLs (0.25 mg total) by nebulization 2 (two) times daily. 10/07/24 10/07/24  Jerilynn Daphne SAILOR, NP    Physical Exam: Vitals:   11/15/24 1515 11/15/24 1530 11/15/24 1545 11/15/24 1600  BP: (!) 111/50 (!) 96/54 (!) 96/48 (!) 106/54  Pulse:   87   Resp: 18 17 16 15   Temp:      TempSrc:      SpO2:   100%     Physical Exam Constitutional:      General: She is not in acute distress.    Appearance: She is ill-appearing.  HENT:     Head: Normocephalic and atraumatic.     Mouth/Throat:     Mouth: Mucous membranes are moist.     Pharynx: Oropharynx is clear.  Eyes:     Extraocular Movements: Extraocular movements intact.     Pupils: Pupils are equal, round, and  reactive to light.  Cardiovascular:     Rate and Rhythm: Normal rate and regular rhythm.     Pulses: Normal pulses.     Heart sounds: Normal heart sounds.  Pulmonary:     Effort: Pulmonary effort is normal. No respiratory distress.     Breath sounds: Normal breath sounds.  Abdominal:     General: Bowel sounds are normal. There is no distension.     Palpations: Abdomen is soft.     Tenderness: There is no abdominal tenderness.  Musculoskeletal:        General: No swelling or deformity.  Skin:    General: Skin is warm and dry.  Neurological:     General: No focal deficit present.     Mental Status: Mental status is at baseline.    Labs on Admission: I have personally reviewed following labs and imaging studies  CBC: Recent Labs  Lab 11/15/24  1225  WBC PENDING  NEUTROABS PENDING  HGB 9.3*  HCT 26.9*  MCV 101.9*  PLT 142*    Basic Metabolic Panel: Recent Labs  Lab 11/15/24 1225  NA 131*  K 5.8*  CL 88*  CO2 23  GLUCOSE 49*  BUN 103*  CREATININE 9.69*  CALCIUM  8.9  MG 2.1    GFR: CrCl cannot be calculated (Unknown ideal weight.).  Liver Function Tests: Recent Labs  Lab 11/15/24 1225  AST 103*  ALT 78*  ALKPHOS 1,218*  BILITOT 0.8  PROT 6.3*  ALBUMIN  2.3*    Urine analysis:    Component Value Date/Time   COLORURINE YELLOW 07/18/2024 2142   APPEARANCEUR HAZY (A) 07/18/2024 2142   LABSPEC 1.025 07/18/2024 2142   PHURINE 5.0 07/18/2024 2142   GLUCOSEU NEGATIVE 07/18/2024 2142   GLUCOSEU NEG mg/dL 95/91/7990 8581   HGBUR NEGATIVE 07/18/2024 2142   BILIRUBINUR SMALL (A) 07/18/2024 2142   BILIRUBINUR neg 10/19/2022 1340   KETONESUR NEGATIVE 07/18/2024 2142   PROTEINUR 30 (A) 07/18/2024 2142   UROBILINOGEN 0.2 10/19/2022 1340   UROBILINOGEN 0.2 10/21/2014 2347   NITRITE NEGATIVE 07/18/2024 2142   LEUKOCYTESUR MODERATE (A) 07/18/2024 2142    Radiological Exams on Admission: No results found.  EKG: Not yet performed.  Most recent EKG in  December of last year showed Sinus rhythm at 69 bpm.  Nonspecific T wave changes.  PACs noted.  Left bundle branch block.  Prolonged QTc at 632.  Assessment/Plan Principal Problem:   Hyperkalemia Active Problems:   History of aortic dissection   Hypertension   HIV (human immunodeficiency virus infection) (HCC)   Type 2 diabetes mellitus with hyperlipidemia (HCC)   Hypothyroidism   Depression   Asthma   GAD (generalized anxiety disorder)   Hyperlipidemia   ESRD (end stage renal disease) (HCC)   Normocytic anemia   Chronic gout   H/O aortic aneurysm repair   Benign neoplasm of pituitary gland (HCC)   Abnormal esophagram   Hypoglycemia   HFrEF (heart failure with reduced ejection fraction) (HCC)   Pituitary apoplexy   History of breast cancer   LFT elevation   Hyperkalemia Hyponatremia ESRD on PD Missed PD session > Patient presenting with weakness and fatigue.  Recently discharged yesterday after prolonged admission at [facility].  Missed PD session overnight last night. > Found to have potassium now 5.8, which was normal yesterday.  Creatinine 9.69.  Sodium 131 which was 133 yesterday.  Magnesium  normal. > Patient received Lokelma , dextrose , calcium  gluconate to temporize hyperkalemia.  Nephrology consulted in the ED for PD and electrolyte disturbance. - Monitor on telemetry overnight - Appreciate nephrology recommendations and assistance - PD per nephrology - Continue to trend renal function and electrolytes  Hypoglycemia Diabetes > Patient presenting with weakness and fatigue and found to have hypoglycemia. > Recent prolonged admission for dysphagia and malnutrition at [facility].  Dobbhoff feeding tube placed.  Patient reports she did start to her tube feed overnight but for 1 reason another only 300 mL went in overnight. > Did have some hypoglycemic events at [facility] prior to tube feeds it appears but I do not see any in the last few days she was there. > Required multiple doses of  dextrose  and L D10 infusion in the ED. - Monitoring on telemetry as above - Continue D10 infusion for now - Trend CBG every 2 hours for now - Resume tube feeds  Dysphagia Malnutrition Esophageal dysmotility > Patient admitted to [facility] 1/13-1/29 as above. > Prior  to admission had EGD in November which was largely normal but patient underwent empiric dilation.  Had continued dysphagia after this and decreased appetite leading to anorexia and malnutrition. > At [facility] they evaluated for mesenteric ischemia component by reevaluating angiography of patient's prior TEVAR for her history of aortic dissection given his history combined with low output heart failure. This was patent and perfusing. > At [facility] had repeat swallow study done which showed mild dysmotility with barium tablet getting lodged at the GE junction.  SLP ultimately recommended advance to regular diet with thin liquids as tolerated. > GI was consulted to [facility] and recommended outpatient manometry. > To supplement nutrition patient had NG tube placed and then was transition to Dobbhoff tube. > Etiology remains unclear, plan for outpatient manometry.  Will continue to evaluate this hypoglycemic episod as above e as well as alk phos as below. - Continue with tube feeds (will start with current schedule of 60 mL/h overnight for 16 hours) - Nutrition consult - CMV workup as below  Pituitary apoplexy Adrenal insufficiency Hypothyroidism > Patient with history of pituitary apoplexy and prior diagnosis of adrenal insufficiency on chronic steroids. > Patient also has history of thyroid  adenoma status post thyroidectomy and subsequent hypothyroidism. > At [facility] medications adjusted with reduction in hydrocortisone  dose to 15 mg in the morning and 5 mg in the evening.  Synthroid  dose reduced to 125 mcg/day.  They plan for outpatient endocrinology follow-up. - Continue with home hydrocortisone  and Synthroid  - Continue plan to follow-up outpatient  with endocrinology  LFT elevation Elevated alk phos ?CMV > LFTs with AST of  Only mild elevation in to 1218.  LFTs otherwise notable for AST of 108 and ALT of 78.  Bilirubin normal. > At [facility] AST and ALT were normal.  Alk phos was elevated there to 422.  GGT was checked and this was elevated to 115.  No repeat of these labs done during the admission.  Patient did have hepatitis C and hepatitis B check.  Hepatitis C antibody was nonreactive hepatitis B surface antigen was nonreactive and hepatitis B surface antibody was positive.  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 > ID at [facility] commented on the CMV result: the low level of CMV DNAemia from the blood is not of clinical significance and likely reflects low-grade reactivation in the setting of acute illness. Query whether this could be metabolic or endocrine related in light of her pituitary apoplexy > CT of the abdomen pelvis with contrast done on 1/21 showed no acute abnormality. > Given the pattern of LFT elevation and prior low-level positive CMV we will recheck CMV and get right upper quadrant ultrasound.  I see no prior history of CMV on initial search of the chart to indicate that this could be reactivation.  > CMV will need to be evaluated and considered as a possible contributor to her systemic symptoms. - Repeat GGT - Repeat CMV labs - Right upper quadrant ultrasound   Chronic systolic CHF > Last echo was done during recent admission at [facility] on 1/14.  Showed EF of 20%, indeterminate diastolic function normal RV function, stable moderate mitral regurgitation. > Not currently on diuretic - Was on metoprolol  and candesartan  at [facility], however had issues with her blood pressure tolerating this. - Will work to confirm current medication regimen however with low normal blood pressures in the ED no plans for resumption at this time. - I's and O's,  daily  weights  ADDENDUM Borderline Hypotension > Persist low/borderline BP. Systolic BP 90s. Some MAPs <65. - Add Midodrine , has been on this in the past - Change level of care to progressive for now.  Hyperlipidemia - Continue home statin  HIV - Continue home Descovy  and [name]   Aortic aneurysm History of aortic dissection status post TEVAR in 2024 > Stable on recent imaging at [facility]. - Continue home ASA and statin  Anemia > Hemoglobin stable at 9.3 - Trend CBC  History of breast cancer > Status post lobectomy, chemotherapy, immunotherapy - Follow-up with outpatient oncology  Asthma - Continue home as needed albuterol   Gout - Continue home allopurinol   DVT prophylaxis: Eliquis  Code Status:   Full Family Communication:  Updated at bedside  Disposition Plan:   Patient is from:  Home  Anticipated DC to:  Pending clinical course  Anticipated DC date:  1 to 3 days  Anticipated DC barriers: None  Consults called:  Nephrology Admission status:  Observation, telemetry  Severity of Illness: The appropriate patient status for this patient is OBSERVATION. Observation status is judged to be reasonable and necessary in order to provide the required intensity of service to ensure the patient's safety. The patient's presenting symptoms, physical exam findings, and initial radiographic and laboratory data in the context of their medical condition is felt to place them at decreased risk for further clinical deterioration. Furthermore, it is anticipated that the patient will be medically stable for discharge from the hospital within 2 midnights of admission.    Marsa KATHEE Scurry MD [facility]  How to contact the [facility] Attending or Consulting provider 7A - 7P or covering provider during after hours 7P -7A, for this patient?   Check the care team in [facility] and look for a) attending/consulting [facility] provider listed and b) the [facility] team listed Log into [email or web address] and use [place]'s  universal password to access. If you do not have the password, please contact the hospital operator. Locate the [facility] provider you are looking for under [facility] and page to a number that you can be directly reached. If you still have difficulty reaching the provider, please page the [facility] (Director on Call) for the Hospitalists listed on amion for assistance.  11/15/2024, 4:09 PM       [1]  Allergies Allergen Reactions   Genvoya [Elviteg-Cobic-Emtricit-Tenofaf] Hives   Lisinopril Cough   Aldactone  [Spironolactone ] Hives   Valium  [Diazepam ] Other (See Comments)    Lethargy (pt states she cannot take this again)   Tegaderm Ag Mesh [Silver] Itching   "

## 2024-11-15 NOTE — ED Notes (Signed)
"  Patient repositioned in bed.   "

## 2024-11-15 NOTE — ED Notes (Addendum)
 Pt alert, NAD, calm, interactive, resps e/u, speaking in clear choppy phrases. C/o fatigue. Denies pain sob or nausea. Reports h/o and recent recurrent c.diff. PT of [facility]. L abd CAPD tubes/site. L nare feeding tube. States, feel a little better than on arrival. Reports CAPD and feeding has not gone well at home. EDP at [facility]. Pending lab results. Dextrose  recently given for low BS.

## 2024-11-15 NOTE — Consult Note (Signed)
 Reason for Consult: ESRD Referring Physician: Dr. Marsa  Chief Complaint: weakness and fatigue  PD orders 5 cycles, 2.25 L fill volume, 2.5% Dianeal , 1hr 30 min dwell  Assessment/Plan: ESRD -on peritoneal dialysis and will restart him on therapy tonight; workup was negative for infection at [facility] and patient completed a 10-day course with oral antibiotics for questionable exercise infection. - Will have staff educate and also start PD for her tonight; she did not know there is an adapter that can be removed to allow her to hook up to her own machine at home. -Monitor Daily I/Os, Daily weight  -Maintain MAP>65 for optimal renal perfusion.  - Avoid nephrotoxic agents such as IV contrast, NSAIDs, and phosphate containing bowel preps (FLEETS)  Hyperkalemia - treated medically in the ED and will start him on peritoneal dialysis tonight as well. 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  Started on tube feeds, phos binders were held, blood cultures all negative.  GI consulted  and plan for outpatient manometry.  Patient also with questionable exit site infection of the PD and completed 10-day course of Keflex  with no signs of infection in the peritoneal fluid.  Patient now presenting with weakness and fatigue with blood pressures that were on the lower side in the emergency department with a BUN/creatinine 103/9.69 elevated alkaline phosphatase, potassium 5.8 treated medically in the ED.  Of note patient did not do peritoneal dialysis the evening before.  ROS Pertinent items are noted in HPI.  Chemistry and CBC: Creatinine  Date/Time Value Ref Range Status  07/27/2021 11:10 AM 2.72 (H) 0.44 - 1.00 mg/dL Final  89/73/7979 97:52 PM 1.98 (H) 0.44 - 1.00 mg/dL Final  92/74/7980 97:41 PM 1.28 (H) 0.44 - 1.00 mg/dL Final   Creat  Date/Time Value Ref Range Status  03/17/2021 10:50 AM 2.74 (H) 0.50 - 1.05 mg/dL Final    Comment:    For patients >54 years of age, the reference limit for Creatinine is approximately 13% higher for people identified as African-American. .   09/23/2015 10:47 AM 1.02 0.50 - 1.10 mg/dL Final  94/68/7983 90:85 AM 0.92 0.50 - 1.10 mg/dL Final  87/70/7984 92:70 PM 1.02 0.50 - 1.10 mg/dL Final  98/92/7984 97:58 PM 0.88 0.50 - 1.10 mg/dL Final  93/81/7985 96:46 PM 0.82 0.50 - 1.10 mg/dL Final  96/72/7985 89:59 AM 0.93 0.50 - 1.10 mg/dL Final  95/74/7986 96:54 PM 0.75 0.50 - 1.10 mg/dL Final  89/91/7987 88:98 AM 0.80 0.50 - 1.10 mg/dL Final    Comment:     Please note change in reference range(s).      Creatinine, Ser  Date/Time Value Ref Range Status  11/15/2024 12:25 PM 9.69 (H) 0.44 -  1.00 mg/dL Final  87/77/7974 94:66 AM 11.10 (H) 0.44 - 1.00 mg/dL Final  87/80/7974 93:93 AM 11.70 (H) 0.44 - 1.00 mg/dL Final  87/81/7974 94:92 AM 11.70 (H) 0.44 - 1.00 mg/dL Final  87/83/7974 95:71 AM 12.31 (H) 0.44 - 1.00 mg/dL Final  87/84/7974 95:97 AM 12.56 (H) 0.44 - 1.00 mg/dL Final  87/86/7974 94:99 AM 11.90 (H) 0.44 - 1.00 mg/dL Final   87/87/7974 96:43 AM 11.00 (H) 0.44 - 1.00 mg/dL Final  87/88/7974 94:67 AM 10.62 (H) 0.44 - 1.00 mg/dL Final  87/89/7974 94:62 AM 11.08 (H) 0.44 - 1.00 mg/dL Final  87/90/7974 96:45 AM 11.85 (H) 0.44 - 1.00 mg/dL Final  87/91/7974 97:68 PM 14.09 (H) 0.44 - 1.00 mg/dL Final  87/91/7974 98:47 PM 14.30 (H) 0.44 - 1.00 mg/dL Final  88/74/7974 97:53 AM 14.70 (H) 0.44 - 1.00 mg/dL Final  88/75/7974 96:41 AM 14.73 (H) 0.44 - 1.00 mg/dL Final  88/76/7974 94:72 AM 15.88 (H) 0.44 - 1.00 mg/dL Final  88/77/7974 92:77 PM 15.35 (H) 0.44 - 1.00 mg/dL Final  89/82/7974 94:74 AM 13.77 (H) 0.44 - 1.00 mg/dL Final  89/83/7974 97:70 AM 14.10 (H) 0.44 - 1.00 mg/dL Final  89/85/7974 91:80 PM 13.60 (H) 0.44 - 1.00 mg/dL Final  89/85/7974 91:88 PM 13.88 (H) 0.44 - 1.00 mg/dL Final  89/97/7974 94:65 PM 13.70 (H) 0.44 - 1.00 mg/dL Final  89/97/7974 95:58 PM 14.20 (H) 0.44 - 1.00 mg/dL Final  93/95/7974 94:82 PM 12.70 (H) 0.44 - 1.00 mg/dL Final  96/73/7974 90:91 AM 6.00 (H) 0.44 - 1.00 mg/dL Final  96/93/7974 88:60 AM 6.40 (H) 0.44 - 1.00 mg/dL Final  88/70/7975 93:88 PM 3.86 (H) 0.44 - 1.00 mg/dL Final  88/84/7975 90:98 AM 6.67 (H) 0.44 - 1.00 mg/dL Final  88/87/7975 90:53 AM 7.34 (H) 0.44 - 1.00 mg/dL Final  88/88/7975 97:96 AM 5.56 (H) 0.44 - 1.00 mg/dL Final  88/89/7975 89:99 AM 4.03 (H) 0.44 - 1.00 mg/dL Final  88/89/7975 97:50 AM 6.81 (H) 0.44 - 1.00 mg/dL Final  88/90/7975 89:92 PM 6.47 (H) 0.44 - 1.00 mg/dL Final  88/90/7975 94:82 PM 5.93 (H) 0.44 - 1.00 mg/dL Final  88/90/7975 88:65 AM 5.52 (H) 0.44 - 1.00 mg/dL Final  88/90/7975 94:91 AM 5.15 (H) 0.44 - 1.00 mg/dL Final  88/91/7975 88:74 PM 4.76 (H) 0.44 - 1.00 mg/dL Final  88/91/7975 94:40 AM 4.40 (H) 0.44 - 1.00 mg/dL Final  88/93/7975 88:86 AM 4.46 (H) 0.44 - 1.00 mg/dL Final  90/75/7975 97:49 PM 4.54 (H) 0.44 - 1.00 mg/dL Final  92/81/7975 98:67 PM 4.20 (H) 0.44 - 1.00 mg/dL Final  94/76/7975 96:55 PM 4.85 (H) 0.44 - 1.00 mg/dL Final   94/93/7975 94:91 AM 8.07 (H) 0.44 - 1.00 mg/dL Final  94/95/7975 98:63 AM 4.82 (H) 0.44 - 1.00 mg/dL Final  94/96/7975 94:84 AM 6.30 (H) 0.44 - 1.00 mg/dL Final  94/97/7975 96:54 AM 4.39 (H) 0.44 - 1.00 mg/dL Final  94/98/7975 96:84 PM 7.23 (H) 0.44 - 1.00 mg/dL Final  94/98/7975 94:59 AM 6.49 (H) 0.44 - 1.00 mg/dL Final  95/69/7975 93:64 AM 5.14 (H) 0.44 - 1.00 mg/dL Final  95/70/7975 95:90 AM 8.44 (H) 0.44 - 1.00 mg/dL Final  95/71/7975 95:83 AM 7.20 (H) 0.44 - 1.00 mg/dL Final  95/72/7975 95:83 AM 6.03 (H) 0.44 - 1.00 mg/dL Final   Recent Labs  Lab 11/15/24 1225  NA 131*  K 5.8*  CL 88*  CO2 23  GLUCOSE 49*  BUN 103*  CREATININE 9.69*  CALCIUM  8.9  Recent Labs  Lab 11/15/24 1225  WBC PENDING  NEUTROABS PENDING  HGB 9.3*  HCT 26.9*  MCV 101.9*  PLT 142*   Liver Function Tests: Recent Labs  Lab 11/15/24 1225  AST 103*  ALT 78*  ALKPHOS 1,218*  BILITOT 0.8  PROT 6.3*  ALBUMIN  2.3*   Recent Labs  Lab 11/15/24 1225  LIPASE 21   No results for input(s): AMMONIA in the last 168 hours. Cardiac Enzymes: No results for input(s): CKTOTAL, CKMB, CKMBINDEX, TROPONINI in the last 168 hours. Iron  Studies: No results for input(s): IRON , TIBC, TRANSFERRIN, FERRITIN in the last 72 hours. PT/INR: @LABRCNTIP (inr:5)  Xrays/Other Studies: ) Results for orders placed or performed during the hospital encounter of 11/15/24 (from the past 48 hours)  CBG monitoring, ED     Status: Abnormal   Collection Time: 11/15/24 12:15 PM  Result Value Ref Range   Glucose-Capillary 41 (LL) 70 - 99 mg/dL    Comment: Glucose reference range applies only to samples taken after fasting for at least 8 hours.   Comment 1 Notify RN   Comprehensive metabolic panel     Status: Abnormal   Collection Time: 11/15/24 12:25 PM  Result Value Ref Range   Sodium 131 (L) 135 - 145 mmol/L   Potassium 5.8 (H) 3.5 - 5.1 mmol/L   Chloride 88 (L) 98 - 111 mmol/L   CO2 23 22 - 32  mmol/L   Glucose, Bld 49 (L) 70 - 99 mg/dL    Comment: Glucose reference range applies only to samples taken after fasting for at least 8 hours.   BUN 103 (H) 6 - 20 mg/dL   Creatinine, Ser 0.30 (H) 0.44 - 1.00 mg/dL   Calcium  8.9 8.9 - 10.3 mg/dL   Total Protein 6.3 (L) 6.5 - 8.1 g/dL   Albumin  2.3 (L) 3.5 - 5.0 g/dL   AST 896 (H) 15 - 41 U/L   ALT 78 (H) 0 - 44 U/L   Alkaline Phosphatase 1,218 (H) 38 - 126 U/L   Total Bilirubin 0.8 0.0 - 1.2 mg/dL   GFR, Estimated 4 (L) >60 mL/min    Comment: (NOTE) Calculated using the CKD-EPI Creatinine Equation (2021)    Anion gap 20 (H) 5 - 15    Comment: Performed at [facility], [address] [place], [place] [address]  Lipase, blood     Status: None   Collection Time: 11/15/24 12:25 PM  Result Value Ref Range   Lipase 21 11 - 51 U/L    Comment: Performed at [facility], [address], [place], [place] [address]  CBC with Differential     Status: Abnormal (Preliminary result)   Collection Time: 11/15/24 12:25 PM  Result Value Ref Range   WBC PENDING 4.0 - 10.5 K/uL   RBC 2.64 (L) 3.87 - 5.11 MIL/uL   Hemoglobin 9.3 (L) 12.0 - 15.0 g/dL   HCT 73.0 (L) 63.9 - 53.9 %   MCV 101.9 (H) 80.0 - 100.0 fL   MCH 35.2 (H) 26.0 - 34.0 pg   MCHC 34.6 30.0 - 36.0 g/dL   RDW 83.5 (H) 88.4 - 84.4 %   Platelets 142 (L) 150 - 400 K/uL    Comment: Performed at [facility], [address], [place], [place] [address]   nRBC PENDING 0.0 - 0.2 %   Neutrophils Relative % PENDING %   Neutro Abs PENDING 1.7 - 7.7 K/uL   Band Neutrophils PENDING %  Lymphocytes Relative PENDING %   Lymphs Abs PENDING 0.7 - 4.0 K/uL   Monocytes Relative PENDING %   Monocytes Absolute PENDING 0.1 - 1.0 K/uL   Eosinophils Relative PENDING %   Eosinophils Absolute PENDING 0.0 - 0.5 K/uL   Basophils Relative PENDING %   Basophils Absolute PENDING 0.0 - 0.1 K/uL   WBC Morphology PENDING    RBC Morphology PENDING    Smear Review PENDING    Other  PENDING %   nRBC PENDING 0 /100 WBC   Metamyelocytes Relative PENDING %   Myelocytes PENDING %   Promyelocytes Relative PENDING %   Blasts PENDING %   Immature Granulocytes PENDING %   Abs Immature Granulocytes PENDING 0.00 - 0.07 K/uL  Magnesium      Status: None   Collection Time: 11/15/24 12:25 PM  Result Value Ref Range   Magnesium  2.1 1.7 - 2.4 mg/dL    Comment: Performed at [facility], [address] [place], [place] [address]  POC CBG, ED     Status: None   Collection Time: 11/15/24 12:57 PM  Result Value Ref Range   Glucose-Capillary 88 70 - 99 mg/dL    Comment: Glucose reference range applies only to samples taken after fasting for at least 8 hours.  POC CBG, ED     Status: Abnormal   Collection Time: 11/15/24  2:51 PM  Result Value Ref Range   Glucose-Capillary 44 (LL) 70 - 99 mg/dL    Comment: Glucose reference range applies only to samples taken after fasting for at least 8 hours.   Comment 1 Notify RN   POC CBG, ED     Status: None   Collection Time: 11/15/24  3:49 PM  Result Value Ref Range   Glucose-Capillary 98 70 - 99 mg/dL    Comment: Glucose reference range applies only to samples taken after fasting for at least 8 hours.   *Note: Due to a large number of results and/or encounters for the requested time period, some results have not been displayed. A complete set of results can be found in Results Review.   No results found.  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  Genes Analyzed (43 total): APC*, ATM*, AXIN2, BARD1, BMPR1A, BRCA1*, BRCA2*, BRIP1*, CDH1*, CDK4, CDKN2A, CHEK2*, DICER1, GALNT12, HOXB13,  MEN1, MLH1*, MRE11A, MSH2*, MSH3, MSH6*, MUTYH*, NBN, NF1*, NTHL1, PALB2*, PMS2*, POLD1, POLE, PTEN*, RAD50, RAD51C*, RAD51D*, RET, SDHB, SDHD, SMAD4, SMARCA4, STK11 and TP53* (sequencing and deletion/duplication); EGFR (s   GERD 07/24/2006   GRAVE'S DISEASE 01/01/2008   History of hidradenitis suppurativa    History of kidney stones    History of thrush    HIV DISEASE 07/24/2006   dx March 05   Hyperlipidemia    HYPERTENSION 07/24/2006   Hyperthyroidism 08/2006   Grave's Disease -diffuse radiotracer uptake 08/25/06 Thyroid  scan-Cold nodule to R lower lobe of thyrorid   Ileus (HCC) 02/14/2023   Left bundle branch block (LBBB)    Malnutrition of moderate degree 02/08/2023   Menometrorrhagia    hx of   Nephrolithiasis    Panniculitis 05/12/2020   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 Completed 2 yrs Prednisone  after lung bx confirmation. No symptoms since then.   Sebaceous cyst 04/21/2020   Shock (HCC) 09/23/2024   Suppurative hidradenitis    Thyroid  cancer (HCC)    THYROID  NODULE, RIGHT 02/08/2007    PSH:   Past Surgical History:  Procedure Laterality Date   AORTIC ARCH DEBRANCHING N/A 08/25/2023   Procedure: AORTIC ARCH DEBRANCHING USING 12X8X8MM HEMASHIELD GRAFT;  Surgeon: Lucas Dorise POUR, MD;  Location: [facility];  Service: Open Heart Surgery;  Laterality: N/A;  with bypasses to the innominate and left common carotid arteries   APPLICATION OF WOUND VAC N/A 01/20/2021   Procedure: APPLICATION OF WOUND VAC;  Surgeon: Lowery Estefana RAMAN, DO;  Location: [facility];  Service: Plastics;  Laterality: N/A;   BREAST EXCISIONAL BIOPSY Right 04/26/2018   right axilla negative   BREAST EXCISIONAL BIOPSY Left 04/26/2018   left axilla negative   BREAST LUMPECTOMY Right 10/03/2018   malignant   BREAST LUMPECTOMY WITH RADIOACTIVE SEED AND SENTINEL LYMPH NODE BIOPSY Right 10/03/2018   Procedure: RIGHT BREAST LUMPECTOMY WITH RADIOACTIVE SEED AND SENTINEL LYMPH NODE MAPPING;  Surgeon: Vanderbilt Ned, MD;  Location: [facility];  Service: General;  Laterality: Right;   BREAST SURGERY  1997   Breast Reduction    CAPD INSERTION N/A 12/21/2023   Procedure: LAPAROSCOPIC INSERTION CONTINUOUS AMBULATORY PERITONEAL DIALYSIS  (CAPD) CATHETER; LAPAROSCOPIC OMENTUMPEXY;  Surgeon: Magda Ned SAILOR, MD;  Location: [facility];  Service: Vascular;  Laterality: N/A;   CYSTOSCOPY W/ URETERAL STENT REMOVAL  11/09/2012   Procedure: CYSTOSCOPY WITH STENT REMOVAL;  Surgeon: Ricardo Likens, MD;  Location: [facility];  Service: Urology;   Laterality: Right;   CYSTOSCOPY WITH RETROGRADE PYELOGRAM, URETEROSCOPY AND STENT PLACEMENT  11/09/2012   Procedure: CYSTOSCOPY WITH RETROGRADE PYELOGRAM, URETEROSCOPY AND STENT PLACEMENT;  Surgeon: Ricardo Likens, MD;  Location: [facility];  Service: Urology;  Laterality: Left;  LEFT URETEROSCOPY, STONE MANIPULATION, left STENT exchange    CYSTOSCOPY WITH STENT PLACEMENT  10/02/2012   Procedure: CYSTOSCOPY WITH STENT PLACEMENT;  Surgeon:  Ricardo Likens, MD;  Location: [facility];  Service: Urology;  Laterality: Left;   DEBRIDEMENT AND CLOSURE WOUND N/A 01/20/2021   Procedure: Excision of abdominal wound with closure;  Surgeon: Lowery Estefana RAMAN, DO;  Location: [facility];  Service: Plastics;  Laterality: N/A;   DILATION AND CURETTAGE OF UTERUS  11/2002   s/p for 1st trimester nonviable pregnancy   ESOPHAGOGASTRODUODENOSCOPY N/A 09/11/2024   Procedure: EGD (ESOPHAGOGASTRODUODENOSCOPY);  Surgeon: Abran Norleen SAILOR, MD;  Location: [facility];  Service: Gastroenterology;  Laterality: N/A;   EYE SURGERY     sty under eyelid   INCISE AND DRAIN ABCESS  08/2002   s/p I &D for righ inframmary fold hidradenitis   INCISION AND DRAINAGE PERITONSILLAR ABSCESS  12/2001   IR CV LINE INJECTION  06/07/2018   IR FLUORO GUIDE CV LINE RIGHT  01/30/2023   IR IMAGING GUIDED PORT INSERTION  06/20/2018   IR REMOVAL TUN ACCESS W/ PORT W/O FL MOD SED  06/20/2018   IR US  GUIDE VASC ACCESS RIGHT  01/30/2023   IRRIGATION AND DEBRIDEMENT ABSCESS  01/31/2012   Procedure: IRRIGATION AND DEBRIDEMENT ABSCESS;  Surgeon: Alm VEAR Angle, MD;  Location: [facility];  Service: General;  Laterality: Right;  right breast and axilla    LAPAROSCOPIC REPOSITIONING CAPD CATHETER Left 01/10/2024   Procedure: Removal of peritoneal dialysis Catheter.;  Surgeon: Magda Debby SAILOR, MD;  Location[facility];  Service: Vascular;  Laterality: Left;   LAPAROSCOPY N/A 01/10/2024   Procedure: LAPAROSCOPY, DIAGNOSTIC;  Surgeon: Magda Debby SAILOR, MD;   Location: [facility] OR;  Service: Vascular;  Laterality: N/A;   NEPHROLITHOTOMY  10/02/2012   Procedure: NEPHROLITHOTOMY PERCUTANEOUS;  Surgeon: Ricardo Likens, MD;  [facility]: [facility];  Service: Urology;  Laterality: Right;  First Stage Percutaneous Nephrolithotomy with Surgeon Access, Left Ureteral Stent     NEPHROLITHOTOMY  10/04/2012   Procedure: NEPHROLITHOTOMY PERCUTANEOUS SECOND LOOK;  Surgeon: Ricardo Likens, MD;  [facility];  Service: Urology;  Laterality: Right;      NEPHROLITHOTOMY  10/08/2012   Procedure: NEPHROLITHOTOMY PERCUTANEOUS;  Surgeon: Ricardo Likens, MD;  [facility]: [facility];  Service: Urology;  Laterality: Right;  THIRD STAGE, nephrostomy tube exchange x 2   NEPHROLITHOTOMY  10/11/2012   Procedure: NEPHROLITHOTOMY PERCUTANEOUS SECOND LOOK;  Surgeon: Ricardo Likens, MD;  Location: [facility];  Service: Urology;  Laterality: Right;  RIGHT 4 STAGE PERCUTANOUS NEPHROLITHOTOMY, right URETEROSCOPY WITH HOLMIUM LASER    PANNICULECTOMY N/A 12/21/2020   Procedure: PANNICULECTOMY;  Surgeon: Lowery Estefana RAMAN, DO;  Location: [facility];  Service: Plastics;  Laterality: N/A;   PERCUTANEOUS NEPHROSTOLITHOTOMY  04/2022   PORT-A-CATH REMOVAL N/A 07/16/2020   Procedure: REMOVAL PORT-A-CATH;  Surgeon: Vanderbilt Debby, MD;  Location: [facility];  Service: General;  Laterality: N/A;   PORTACATH PLACEMENT Left 05/17/2018   Procedure: INSERTION PORT-A-CATH;  Surgeon: Vernetta Berg, MD;  Location: [facility];  Service: General;  Laterality: Left;   RADICAL NECK DISSECTION  04/12/2017   limited/notes 04/12/2017   RADICAL NECK DISSECTION N/A 04/12/2017   Procedure: RADICAL NECK DISSECTION;  Surgeon: Carlie Clark, MD;  Location[facility];  Service: ENT;  Laterality: N/A;  limited neck dissection 2 hours total   REDUCTION MAMMAPLASTY Bilateral 1998   RIGHT/LEFT HEART CATH AND CORONARY ANGIOGRAPHY N/A 03/12/2020   Procedure: RIGHT/LEFT HEART CATH AND CORONARY ANGIOGRAPHY;   Surgeon: Cherrie Toribio SAUNDERS, MD;  [facility];  Service: Cardiovascular;  Laterality: N/A;   RIGHT/LEFT HEART CATH AND CORONARY  ANGIOGRAPHY N/A 09/26/2024   Procedure: RIGHT/LEFT HEART CATH AND CORONARY ANGIOGRAPHY;  Surgeon: Darron Deatrice LABOR, MD;  [facility];  Service: Cardiovascular;  Laterality: N/A;   [place]  1994   TEE WITHOUT CARDIOVERSION N/A 08/25/2023   Procedure: TRANSESOPHAGEAL ECHOCARDIOGRAM;  Surgeon: Lucas Dorise POUR, MD;  [facility];  Service: Open Heart Surgery;  Laterality: N/A;   TENCKHOFF CATHETER INSERTION Left 01/10/2024   Procedure: INSERTION, CATHETER, DIALYSIS, PERITONEAL;  Surgeon: Magda Debby SAILOR, MD;  Location: [facility];  Service: Vascular;  Laterality: Left;   THORACIC AORTIC ENDOVASCULAR STENT GRAFT N/A 02/02/2023   Procedure: THORACIC AORTIC ENDOVASCULAR STENT GRAFT;  Surgeon: Lanis Fonda BRAVO, MD;  Location: [facility] OR;  Service: Vascular;  Laterality: N/A;   THORACIC AORTIC ENDOVASCULAR STENT GRAFT N/A 08/25/2023   Procedure: THORACIC AORTIC ENDOVASCULAR STENT GRAFT;  Surgeon: Lanis Fonda BRAVO, MD;  Location: [facility] OR;  Service: Vascular;  Laterality: N/A;   THYROIDECTOMY  04/12/2017   completion/notes 04/12/2017   THYROIDECTOMY N/A 04/12/2017   Procedure: THYROIDECTOMY;  Surgeon: Carlie Clark, MD;  Location: [facility] OR;  Service: ENT;  Laterality: N/A;  Completion Thyroidectomy   TOTAL THYROIDECTOMY  2010   ULTRASOUND GUIDANCE FOR VASCULAR ACCESS Right 08/25/2023   Procedure: ULTRASOUND GUIDANCE FOR VASCULAR ACCESS, RIGHT FEMORAL ARTERY;  Surgeon: Lanis Fonda BRAVO, MD;  Location: [facility] OR;  Service: Vascular;  Laterality: Right;    Allergies: Allergies[1]  Medications:   Prior to Admission medications  Medication Sig Start Date End Date Taking? Authorizing Provider  acetaminophen  (TYLENOL ) 325 MG tablet Take 1-2 tablets (325-650 mg total) by mouth every 4 (four) hours as needed for mild pain (pain score 1-3). 10/07/24   Jerilynn Daphne SAILOR,  NP  allopurinol  (ZYLOPRIM ) 100 MG tablet TAKE 1 TABLET (100 MG TOTAL) BY MOUTH DAILY. FOR GOUT PREVENTION 04/11/24   Clark, Katherine K, NP  aspirin  81 MG chewable tablet Chew 1 tablet (81 mg total) by mouth daily. 10/07/24   Jerilynn Daphne SAILOR, NP  busPIRone  (BUSPAR ) 5 MG tablet Take 2 tablets (10 mg total) by mouth 2 (two) times daily. 10/07/24   Jerilynn Daphne SAILOR, NP  calcitRIOL  (ROCALTROL ) 0.25 MCG capsule Take 0.25 mcg by mouth 3 (three) times a week. 08/07/24   [provider]  Darbepoetin Alfa  (ARANESP ) 100 MCG/0.5ML SOSY injection Inject 0.5 mLs (100 mcg total) into the skin every Monday at 6 PM. 10/07/24   Jerilynn Daphne SAILOR, NP  dolutegravir  ([name] ) 50 MG tablet Take 1 tablet (50 mg total) by mouth daily. 05/13/24   Melvenia Corean SAILOR, NP  emtricitabine -tenofovir  AF (DESCOVY ) 200-25 MG tablet Take 1 tablet by mouth daily. 05/13/24   Melvenia Corean SAILOR, NP  famotidine  (PEPCID ) 10 MG tablet Take 1 tablet (10 mg total) by mouth daily. 10/08/24   Jerilynn Daphne SAILOR, NP  feeding supplement (ENSURE PLUS HIGH PROTEIN) LIQD Take 237 mLs by mouth 2 (two) times daily between meals. 10/07/24   Jerilynn Daphne SAILOR, NP  fludrocortisone  (FLORINEF ) 0.1 MG tablet Take 1 tablet (0.1 mg total) by mouth daily. 10/07/24   Jerilynn Daphne SAILOR, NP  fluticasone  (FLOVENT  HFA) 110 MCG/ACT inhaler Inhale 1 puff into the lungs 2 (two) times daily.    [provider]  hydrocortisone  (CORTEF ) 5 MG tablet Take 3 tablets (15 mg total) by mouth 2 (two) times daily. 10/07/24   Jerilynn Daphne SAILOR, NP  Lancets [facility] ULTRASOFT) lancets Use as instructed to test blood sugar daily 02/15/24   Clark, Katherine K, NP  levocetirizine (XYZAL ) 5 MG tablet TAKE 1 TABLET BY MOUTH EVERY DAY IN THE EVENING FOR ALLERGIES 05/12/24   Clark, Katherine K, NP  levothyroxine  (SYNTHROID ) 150 MCG tablet TAKE 1 TABLET BY MOUTH EVERY DAY BEFORE BREAKFAST 10/14/24   Trixie File, MD  lidocaine  (LIDODERM ) 5 % PLACE 1 PATCH ONTO  THE SKIN DAILY. REMOVE & DISCARD PATCH WITHIN 12 HOURS OR AS DIRECTED BY MD Patient taking differently: Place 1 patch onto the skin daily as needed. Remove & Discard patch within 12 hours or as directed by MD 04/21/24   Gretta Comer POUR, NP  mirtazapine  (REMERON ) 15 MG tablet Take 1 tablet (15 mg total) by mouth at bedtime. For appetite. 10/25/24   Clark, Katherine K, NP  multivitamin (RENA-VIT) TABS tablet Take 1 tablet by mouth at bedtime. 10/07/24   Jerilynn Daphne SAILOR, NP  omeprazole  (PRILOSEC) 40 MG capsule Take 1 capsule (40 mg total) by mouth 2 (two) times daily. for heartburn. 09/12/24   Ghimire, Donalda HERO, MD  ondansetron  (ZOFRAN -ODT) 4 MG disintegrating tablet Take 1 tablet (4 mg total) by mouth every 8 (eight) hours as needed for nausea or vomiting. 10/25/24   Clark, Katherine K, NP  oxyCODONE  (OXY IR/ROXICODONE ) 5 MG immediate release tablet Take 1 tablet (5 mg total) by mouth every 6 (six) hours as needed for moderate pain (pain score 4-6). 10/07/24   Jerilynn Daphne SAILOR, NP  rosuvastatin  (CRESTOR ) 10 MG tablet TAKE 1 TABLET BY MOUTH EVERY DAY FOR CHOLESTEROL 03/21/24   Clark, Katherine K, NP  saccharomyces boulardii (FLORASTOR) 250 MG capsule Take 1 capsule (250 mg total) by mouth daily. 10/08/24   Jerilynn Daphne SAILOR, NP  sevelamer  carbonate (RENVELA ) 800 MG tablet Take 2 tablets (1,600 mg total) by mouth 3 (three) times daily with meals. 10/07/24   Jerilynn Daphne SAILOR, NP  traZODone  (DESYREL ) 50 MG tablet Take 0.5 tablets (25 mg total) by mouth at bedtime as needed for sleep. 10/07/24   Jerilynn Daphne SAILOR, NP  budesonide  (PULMICORT ) 0.25 MG/2ML nebulizer solution Take 2 mLs (0.25 mg total) by nebulization 2 (two) times daily. 10/07/24 10/07/24  Jerilynn Daphne SAILOR, NP    Discontinued Meds:  There are no discontinued medications.  Social History:  reports that she quit smoking about 7 years ago. Her smoking use included cigarettes. She started smoking about 7 years ago. She has a 7.5 pack-year  smoking history. She has never used smokeless tobacco. She reports that she does not currently use alcohol . She reports that she does not use drugs.  Family History:   Family History  Problem Relation Age of Onset   Hypertension Mother    Cancer Mother        laryngeal   Heart disease Mother        stent   Pancreatic cancer Father    Hypertension Father    Lung cancer Father 3       hx smoking   Hypertension Sister    Hypertension Sister    Hypertension Brother    Breast cancer Maternal Aunt 74   Breast cancer Maternal Aunt        dx 60+   Cancer Maternal Uncle        Lung CA   Breast cancer Paternal Aunt 62   Breast cancer Paternal Aunt        dx 87's   Breast cancer Paternal Aunt        dx 20's   Prostate cancer Paternal Uncle    Prostate cancer  Paternal Uncle    Lung cancer Paternal Uncle    Breast cancer Cousin 62   Breast cancer Cousin        dx <50   Breast cancer Cousin        dx <50   Breast cancer Cousin        dx <50   Heart disease Other    Hypertension Other    Stroke Other        Grandparent   Kidney disease Other        Grandparent   Diabetes Other        FH of Diabetes   Colon cancer Neg Hx    Esophageal cancer Neg Hx    Rectal cancer Neg Hx    Stomach cancer Neg Hx     Blood pressure (!) 96/48, pulse 87, temperature 97.7 F (36.5 C), temperature source Oral, resp. rate 16, last menstrual period 03/31/2014, SpO2 100%. General appearance: alert, cooperative, and appears stated age Head: Normocephalic, without obvious abnormality, atraumatic Eyes: negative Neck: no adenopathy, no carotid bruit, supple, symmetrical, trachea midline, and thyroid  not enlarged, symmetric, no tenderness/mass/nodules Back: symmetric, no curvature. ROM normal. No CVA tenderness. Resp: clear to auscultation bilaterally Cardio: regular rate and rhythm GI: soft, non-tender; bowel sounds normal; no masses,  no organomegaly and PD catheter present on left side,no  discharge noted Extremities: extremities normal, atraumatic, no cyanosis or edema Pulses: 2+ and symmetric Skin: Skin color, texture, turgor normal. No rashes or lesions       Layonna Dobie, LYNWOOD ORN, MD 11/15/2024, 4:09 PM      [1]  Allergies Allergen Reactions   Genvoya [Elviteg-Cobic-Emtricit-Tenofaf] Hives   Lisinopril Cough   Aldactone  [Spironolactone ] Hives   Valium  [Diazepam ] Other (See Comments)    Lethargy (pt states she cannot take this again)   Tegaderm Ag Mesh [Silver] Itching

## 2024-11-15 NOTE — ED Notes (Signed)
 Poor SPO2 signal, will continue to assess, attempted finger, thumb, ear lobe and forehead.

## 2024-11-16 DIAGNOSIS — R748 Abnormal levels of other serum enzymes: Secondary | ICD-10-CM | POA: Diagnosis not present

## 2024-11-16 DIAGNOSIS — E236 Other disorders of pituitary gland: Secondary | ICD-10-CM | POA: Diagnosis not present

## 2024-11-16 DIAGNOSIS — K224 Dyskinesia of esophagus: Secondary | ICD-10-CM | POA: Diagnosis not present

## 2024-11-16 DIAGNOSIS — E46 Unspecified protein-calorie malnutrition: Secondary | ICD-10-CM

## 2024-11-16 DIAGNOSIS — R630 Anorexia: Secondary | ICD-10-CM | POA: Diagnosis not present

## 2024-11-16 DIAGNOSIS — E1169 Type 2 diabetes mellitus with other specified complication: Secondary | ICD-10-CM | POA: Diagnosis not present

## 2024-11-16 DIAGNOSIS — Z21 Asymptomatic human immunodeficiency virus [HIV] infection status: Secondary | ICD-10-CM | POA: Diagnosis not present

## 2024-11-16 DIAGNOSIS — E039 Hypothyroidism, unspecified: Secondary | ICD-10-CM | POA: Diagnosis not present

## 2024-11-16 DIAGNOSIS — E875 Hyperkalemia: Secondary | ICD-10-CM | POA: Diagnosis not present

## 2024-11-16 DIAGNOSIS — K831 Obstruction of bile duct: Secondary | ICD-10-CM

## 2024-11-16 DIAGNOSIS — E785 Hyperlipidemia, unspecified: Secondary | ICD-10-CM | POA: Diagnosis not present

## 2024-11-16 DIAGNOSIS — D649 Anemia, unspecified: Secondary | ICD-10-CM | POA: Diagnosis not present

## 2024-11-16 DIAGNOSIS — N186 End stage renal disease: Secondary | ICD-10-CM | POA: Diagnosis not present

## 2024-11-16 LAB — GLUCOSE, CAPILLARY
Glucose-Capillary: 73 mg/dL (ref 70–99)
Glucose-Capillary: 86 mg/dL (ref 70–99)
Glucose-Capillary: 92 mg/dL (ref 70–99)
Glucose-Capillary: 96 mg/dL (ref 70–99)
Glucose-Capillary: 92 mg/dL (ref 70–99)
Glucose-Capillary: 77 mg/dL (ref 70–99)
Glucose-Capillary: 91 mg/dL (ref 70–99)
Glucose-Capillary: 115 mg/dL — ABNORMAL HIGH (ref 70–99)

## 2024-11-16 LAB — CYTOMEGALOVIRUS DNA, QUANTITATIVE REAL-TIME PCR, PLASMA
CMV DNA Quant: NEGATIVE [IU]/mL
Log10 CMV Qn DNA Pl: UNDETERMINED {Log_IU}/mL

## 2024-11-16 LAB — CBC
HCT: 25.7 % — ABNORMAL LOW (ref 36.0–46.0)
Hemoglobin: 9.2 g/dL — ABNORMAL LOW (ref 12.0–15.0)
MCH: 35.7 pg — ABNORMAL HIGH (ref 26.0–34.0)
MCHC: 35.8 g/dL (ref 30.0–36.0)
MCV: 99.6 fL (ref 80.0–100.0)
Platelets: 138 10*3/uL — ABNORMAL LOW (ref 150–400)
RBC: 2.58 MIL/uL — ABNORMAL LOW (ref 3.87–5.11)
RDW: 16.4 % — ABNORMAL HIGH (ref 11.5–15.5)
WBC: 13.5 10*3/uL — ABNORMAL HIGH (ref 4.0–10.5)
nRBC: 4.8 % — ABNORMAL HIGH (ref 0.0–0.2)

## 2024-11-16 LAB — COMPREHENSIVE METABOLIC PANEL WITH GFR
ALT: 65 U/L — ABNORMAL HIGH (ref 0–44)
AST: 86 U/L — ABNORMAL HIGH (ref 15–41)
Albumin: 2 g/dL — ABNORMAL LOW (ref 3.5–5.0)
Alkaline Phosphatase: 1114 U/L — ABNORMAL HIGH (ref 38–126)
Anion gap: 19 — ABNORMAL HIGH (ref 5–15)
BUN: 96 mg/dL — ABNORMAL HIGH (ref 6–20)
CO2: 24 mmol/L (ref 22–32)
Calcium: 8.7 mg/dL — ABNORMAL LOW (ref 8.9–10.3)
Chloride: 87 mmol/L — ABNORMAL LOW (ref 98–111)
Creatinine, Ser: 9.1 mg/dL — ABNORMAL HIGH (ref 0.44–1.00)
GFR, Estimated: 5 mL/min — ABNORMAL LOW
Glucose, Bld: 91 mg/dL (ref 70–99)
Potassium: 4.5 mmol/L (ref 3.5–5.1)
Sodium: 130 mmol/L — ABNORMAL LOW (ref 135–145)
Total Bilirubin: 0.7 mg/dL (ref 0.0–1.2)
Total Protein: 5.9 g/dL — ABNORMAL LOW (ref 6.5–8.1)

## 2024-11-16 LAB — PHOSPHORUS: Phosphorus: 3.8 mg/dL (ref 2.5–4.6)

## 2024-11-16 LAB — MAGNESIUM: Magnesium: 2 mg/dL (ref 1.7–2.4)

## 2024-11-16 LAB — GAMMA GT: GGT: 1295 U/L — ABNORMAL HIGH (ref 7–50)

## 2024-11-16 MED ORDER — TRIMETHOBENZAMIDE HCL 100 MG/ML IM SOLN
200.0000 mg | Freq: Four times a day (QID) | INTRAMUSCULAR | Status: AC | PRN
  Administered 2024-11-16 – 2024-11-17 (×2): 200 mg via INTRAMUSCULAR
  Filled 2024-11-16 (×4): qty 2

## 2024-11-16 MED ORDER — HYDROCORTISONE SOD SUC (PF) 100 MG IJ SOLR
50.0000 mg | Freq: Four times a day (QID) | INTRAMUSCULAR | Status: AC
  Administered 2024-11-17 (×4): 50 mg via INTRAVENOUS
  Filled 2024-11-16 (×5): qty 1

## 2024-11-16 MED ORDER — HYDROCORTISONE 5 MG PO TABS
15.0000 mg | ORAL_TABLET | Freq: Every day | ORAL | Status: DC
  Filled 2024-11-16: qty 1

## 2024-11-16 MED ORDER — DELFLEX-LC/2.5% DEXTROSE 394 MOSM/L IP SOLN: INTRAPERITONEAL | Status: DC

## 2024-11-16 MED ORDER — DELFLEX-LC/1.5% DEXTROSE 344 MOSM/L IP SOLN: INTRAPERITONEAL | Status: DC

## 2024-11-16 MED ORDER — THIAMINE MONONITRATE 100 MG PO TABS
100.0000 mg | ORAL_TABLET | Freq: Every day | ORAL | Status: DC
  Administered 2024-11-16 – 2024-11-18 (×3): 100 mg
  Filled 2024-11-16 (×4): qty 1

## 2024-11-16 MED ORDER — PROSOURCE TF20 ENFIT COMPATIBL EN LIQD
60.0000 mL | Freq: Every day | ENTERAL | Status: AC
  Administered 2024-11-16 – 2024-11-22 (×7): 60 mL
  Filled 2024-11-16 (×7): qty 60

## 2024-11-16 MED ORDER — HYDROCORTISONE SOD SUC (PF) 100 MG IJ SOLR
100.0000 mg | Freq: Once | INTRAMUSCULAR | Status: AC
  Administered 2024-11-16: 100 mg via INTRAVENOUS
  Filled 2024-11-16: qty 2

## 2024-11-16 MED ORDER — HYDROCORTISONE 5 MG PO TABS
5.0000 mg | ORAL_TABLET | Freq: Every day | ORAL | Status: DC
  Administered 2024-11-16: 5 mg via ORAL
  Filled 2024-11-16: qty 1

## 2024-11-16 NOTE — Progress Notes (Signed)
 PT Cancellation Note  Patient Details Name: [name] MRN: [id number] DOB: Sep 11, 1966   Cancelled Treatment:    Reason Eval/Treat Not Completed: Patient declined, no reason specified. Pt refusing x2 attempts reporting she does not want to work with therapy in the morning and would rather be seen in the afternoon. Plan to follow up as schedule permits.   Leontine Hilt DPT Acute Rehab Services [phone] Prefer contact via chat    Leontine KATHEE Hilt 11/16/2024, 10:52 AM

## 2024-11-16 NOTE — Progress Notes (Signed)
 1800 Patient stated that she felt shaky and weak and didn't feel well. She also stated that she had a headache. CBG checked - 91, Tube feeds restarted. Acetaminophen  administered.   1830 Vital signs taken - No critical values noted  Charge nurse Garrel notified and at bedside.   RRT nurse was called notify her of the new symptoms. She stated that she would look through her chart.   51  Dr Charlton paged  [phone] - Dr Charlton called and was updated as to the patient's history and change in status regarding her weakness, shaking and HA. He stated that

## 2024-11-16 NOTE — Progress Notes (Signed)
 OT Cancellation Note  Patient Details Name: [name] MRN: [id number] DOB: 06/01/1966   Cancelled Treatment:    Reason Eval/Treat Not Completed: Patient declined, no reason specified. OT/PT attempted pt in early AM, with pt declining, asking to return later. OT/PT returned as requested, pt requesting to return in PM. OT educated pt on importance of mobilizing but pt adamantly declining. Will follow up as able to.   Effa Yarrow C, OT  Acute Rehabilitation Services Office [phone] Secure chat preferred   Adrianne GORMAN Savers 11/16/2024, 11:03 AM

## 2024-11-16 NOTE — Progress Notes (Addendum)
 [name] is an 59 y.o. 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  Started on tube feeds, phos binders were held, blood cultures all negative.  GI consulted and plan for outpatient manometry.  Patient also with questionable exit site infection of the PD and completed 10-day course of Keflex  with no signs of infection in the peritoneal fluid.  P/W weakness and fatigue with blood pressures that were on the lower side in the emergency department with a BUN/creatinine 103/9.69 elevated alkaline phosphatase, potassium 5.8 treated medically in the ED.    PD orders 5 cycles, 2.25 L fill volume, 2.5% Dianeal , 1hr 30 min dwell  Assessment/Plan: ESRD -on peritoneal dialysis and will restart him on therapy tonight; workup was negative for infection at [facility] and patient completed a 10-day course with oral antibiotics for questionable exercise infection. - I had the staff educate about removing the connector; she did not know there is an adapter that can be removed to allow her to hook up to her own machine at home.  She had neg 77mL with the PD exchanges overnight; will continue alternating 2.5/1.5 again tonight.  -Monitor Daily I/Os, Daily weight  -Maintain MAP>65 for optimal renal perfusion.  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  Tolerated PD overnight   Chemistry and CBC: Creatinine  Date/Time Value Ref Range Status  07/27/2021 11:10 AM 2.72 (H) 0.44 - 1.00 mg/dL Final  89/73/7979 97:52 PM 1.98 (H) 0.44 - 1.00 mg/dL Final  92/74/7980 97:41 PM 1.28 (H) 0.44 - 1.00 mg/dL Final   Creat  Date/Time Value Ref Range Status  03/17/2021 10:50 AM 2.74 (H) 0.50 - 1.05 mg/dL Final    Comment:    For patients >63 years of age, the reference limit for Creatinine is approximately 13% higher for people identified as African-American. .   09/23/2015 10:47 AM 1.02 0.50 - 1.10 mg/dL Final  94/68/7983 90:85 AM 0.92 0.50 - 1.10 mg/dL Final  87/70/7984 92:70 PM 1.02 0.50 - 1.10 mg/dL Final  98/92/7984 97:58 PM 0.88 0.50 - 1.10 mg/dL Final  93/81/7985 96:46 PM 0.82 0.50 - 1.10 mg/dL Final  96/72/7985 89:59 AM 0.93 0.50 - 1.10 mg/dL Final  95/74/7986 96:54 PM 0.75 0.50 - 1.10 mg/dL Final  89/91/7987 88:98 AM 0.80 0.50 - 1.10 mg/dL Final    Comment:     Please note change in reference range(s).      Creatinine, Ser  Date/Time Value Ref Range Status  11/16/2024 02:58 AM 9.10 (H) 0.44 - 1.00 mg/dL Final  98/69/7973 87:74 PM 9.69 (H) 0.44 -  1.00 mg/dL Final  87/77/7974 94:66 AM 11.10 (H) 0.44 - 1.00 mg/dL Final  87/80/7974 93:93 AM 11.70 (H) 0.44 - 1.00 mg/dL Final  87/81/7974 94:92 AM 11.70 (H) 0.44 - 1.00 mg/dL Final  87/83/7974 95:71 AM 12.31 (H) 0.44 - 1.00 mg/dL Final  87/84/7974 95:97 AM 12.56 (H) 0.44 - 1.00 mg/dL Final  87/86/7974 94:99 AM 11.90 (H) 0.44 - 1.00 mg/dL Final   87/87/7974 96:43 AM 11.00 (H) 0.44 - 1.00 mg/dL Final  87/88/7974 94:67 AM 10.62 (H) 0.44 - 1.00 mg/dL Final  87/89/7974 94:62 AM 11.08 (H) 0.44 - 1.00 mg/dL Final  87/90/7974 96:45 AM 11.85 (H) 0.44 - 1.00 mg/dL Final  87/91/7974 97:68 PM 14.09 (H) 0.44 - 1.00 mg/dL Final  87/91/7974 98:47 PM 14.30 (H) 0.44 - 1.00 mg/dL Final  88/74/7974 97:53 AM 14.70 (H) 0.44 - 1.00 mg/dL Final  88/75/7974 96:41 AM 14.73 (H) 0.44 - 1.00 mg/dL Final  88/76/7974 94:72 AM 15.88 (H) 0.44 - 1.00 mg/dL Final  88/77/7974 92:77 PM 15.35 (H) 0.44 - 1.00 mg/dL Final  89/82/7974 94:74 AM 13.77 (H) 0.44 - 1.00 mg/dL Final  89/83/7974 97:70 AM 14.10 (H) 0.44 - 1.00 mg/dL Final  89/85/7974 91:80 PM 13.60 (H) 0.44 - 1.00 mg/dL Final  89/85/7974 91:88 PM 13.88 (H) 0.44 - 1.00 mg/dL Final  89/97/7974 94:65 PM 13.70 (H) 0.44 - 1.00 mg/dL Final  89/97/7974 95:58 PM 14.20 (H) 0.44 - 1.00 mg/dL Final  93/95/7974 94:82 PM 12.70 (H) 0.44 - 1.00 mg/dL Final  96/73/7974 90:91 AM 6.00 (H) 0.44 - 1.00 mg/dL Final  96/93/7974 88:60 AM 6.40 (H) 0.44 - 1.00 mg/dL Final  88/70/7975 93:88 PM 3.86 (H) 0.44 - 1.00 mg/dL Final  88/84/7975 90:98 AM 6.67 (H) 0.44 - 1.00 mg/dL Final  88/87/7975 90:53 AM 7.34 (H) 0.44 - 1.00 mg/dL Final  88/88/7975 97:96 AM 5.56 (H) 0.44 - 1.00 mg/dL Final  88/89/7975 89:99 AM 4.03 (H) 0.44 - 1.00 mg/dL Final  88/89/7975 97:50 AM 6.81 (H) 0.44 - 1.00 mg/dL Final  88/90/7975 89:92 PM 6.47 (H) 0.44 - 1.00 mg/dL Final  88/90/7975 94:82 PM 5.93 (H) 0.44 - 1.00 mg/dL Final  88/90/7975 88:65 AM 5.52 (H) 0.44 - 1.00 mg/dL Final  88/90/7975 94:91 AM 5.15 (H) 0.44 - 1.00 mg/dL Final  88/91/7975 88:74 PM 4.76 (H) 0.44 - 1.00 mg/dL Final  88/91/7975 94:40 AM 4.40 (H) 0.44 - 1.00 mg/dL Final  88/93/7975 88:86 AM 4.46 (H) 0.44 - 1.00 mg/dL Final  90/75/7975 97:49 PM 4.54 (H) 0.44 - 1.00 mg/dL Final  92/81/7975 98:67 PM 4.20 (H) 0.44 - 1.00 mg/dL Final  94/76/7975 96:55 PM 4.85 (H) 0.44 - 1.00 mg/dL Final   94/93/7975 94:91 AM 8.07 (H) 0.44 - 1.00 mg/dL Final  94/95/7975 98:63 AM 4.82 (H) 0.44 - 1.00 mg/dL Final  94/96/7975 94:84 AM 6.30 (H) 0.44 - 1.00 mg/dL Final  94/97/7975 96:54 AM 4.39 (H) 0.44 - 1.00 mg/dL Final  94/98/7975 96:84 PM 7.23 (H) 0.44 - 1.00 mg/dL Final  94/98/7975 94:59 AM 6.49 (H) 0.44 - 1.00 mg/dL Final  95/69/7975 93:64 AM 5.14 (H) 0.44 - 1.00 mg/dL Final  95/70/7975 95:90 AM 8.44 (H) 0.44 - 1.00 mg/dL Final  95/71/7975 95:83 AM 7.20 (H) 0.44 - 1.00 mg/dL Final   Recent Labs  Lab 11/15/24 1225 11/16/24 0258  NA 131* 130*  K 5.8* 4.5  CL 88* 87*  CO2 23 24  GLUCOSE 49* 91  BUN 103* 96*  CREATININE 9.69* 9.10*  CALCIUM  8.9 8.7*   Recent  Labs  Lab 11/15/24 1225 11/16/24 0258  WBC 13.1* 13.5*  NEUTROABS 9.8*  --   HGB 9.3* 9.2*  HCT 26.9* 25.7*  MCV 101.9* 99.6  PLT 142* 138*   Liver Function Tests: Recent Labs  Lab 11/15/24 1225 11/16/24 0258  AST 103* 86*  ALT 78* 65*  ALKPHOS 1,218* 1,114*  BILITOT 0.8 0.7  PROT 6.3* 5.9*  ALBUMIN  2.3* 2.0*   Recent Labs  Lab 11/15/24 1225  LIPASE 21   No results for input(s): AMMONIA in the last 168 hours. Cardiac Enzymes: No results for input(s): CKTOTAL, CKMB, CKMBINDEX, TROPONINI in the last 168 hours. Iron  Studies: No results for input(s): IRON , TIBC, TRANSFERRIN, FERRITIN in the last 72 hours. PT/INR: @LABRCNTIP (inr:5)  Xrays/Other Studies: ) Results for orders placed or performed during the hospital encounter of 11/15/24 (from the past 48 hours)  CBG monitoring, ED     Status: Abnormal   Collection Time: 11/15/24 12:15 PM  Result Value Ref Range   Glucose-Capillary 41 (LL) 70 - 99 mg/dL    Comment: Glucose reference range applies only to samples taken after fasting for at least 8 hours.   Comment 1 Notify RN   Comprehensive metabolic panel     Status: Abnormal   Collection Time: 11/15/24 12:25 PM  Result Value Ref Range   Sodium 131 (L) 135 - 145 mmol/L   Potassium 5.8  (H) 3.5 - 5.1 mmol/L   Chloride 88 (L) 98 - 111 mmol/L   CO2 23 22 - 32 mmol/L   Glucose, Bld 49 (L) 70 - 99 mg/dL    Comment: Glucose reference range applies only to samples taken after fasting for at least 8 hours.   BUN 103 (H) 6 - 20 mg/dL   Creatinine, Ser 0.30 (H) 0.44 - 1.00 mg/dL   Calcium  8.9 8.9 - 10.3 mg/dL   Total Protein 6.3 (L) 6.5 - 8.1 g/dL   Albumin  2.3 (L) 3.5 - 5.0 g/dL   AST 896 (H) 15 - 41 U/L   ALT 78 (H) 0 - 44 U/L   Alkaline Phosphatase 1,218 (H) 38 - 126 U/L   Total Bilirubin 0.8 0.0 - 1.2 mg/dL   GFR, Estimated 4 (L) >60 mL/min    Comment: (NOTE) Calculated using the CKD-EPI Creatinine Equation (2021)    Anion gap 20 (H) 5 - 15    Comment: Performed at [facility], [address] [place], [place] [address]  Lipase, blood     Status: None   Collection Time: 11/15/24 12:25 PM  Result Value Ref Range   Lipase 21 11 - 51 U/L    Comment: Performed at [facility], [address], [place], [place] [address]  CBC with Differential     Status: Abnormal   Collection Time: 11/15/24 12:25 PM  Result Value Ref Range   WBC 13.1 (H) 4.0 - 10.5 K/uL   RBC 2.64 (L) 3.87 - 5.11 MIL/uL   Hemoglobin 9.3 (L) 12.0 - 15.0 g/dL   HCT 73.0 (L) 63.9 - 53.9 %   MCV 101.9 (H) 80.0 - 100.0 fL   MCH 35.2 (H) 26.0 - 34.0 pg   MCHC 34.6 30.0 - 36.0 g/dL   RDW 83.5 (H) 88.4 - 84.4 %   Platelets 142 (L) 150 - 400 K/uL   nRBC 4.7 (H) 0.0 - 0.2 %   Neutrophils Relative % 74 %   Neutro Abs 9.8 (H) 1.7 - 7.7 K/uL   Lymphocytes Relative 12 %  Lymphs Abs 1.5 0.7 - 4.0 K/uL   Monocytes Relative 7 %   Monocytes Absolute 0.9 0.1 - 1.0 K/uL   Eosinophils Relative 1 %   Eosinophils Absolute 0.1 0.0 - 0.5 K/uL   Basophils Relative 0 %   Basophils Absolute 0.0 0.0 - 0.1 K/uL   WBC Morphology MORPHOLOGY UNREMARKABLE    RBC Morphology See Note    Smear Review Normal platelet morphology    Immature Granulocytes 6 %   Abs Immature Granulocytes 0.84 (H) 0.00 - 0.07 K/uL    Polychromasia PRESENT    Target Cells PRESENT     Comment: Performed at [facility], [address] [place], [place] [address]  Magnesium      Status: None   Collection Time: 11/15/24 12:25 PM  Result Value Ref Range   Magnesium  2.1 1.7 - 2.4 mg/dL    Comment: Performed at [facility], [address], [place], [place] [address]  POC CBG, ED     Status: None   Collection Time: 11/15/24 12:57 PM  Result Value Ref Range   Glucose-Capillary 88 70 - 99 mg/dL    Comment: Glucose reference range applies only to samples taken after fasting for at least 8 hours.  POC CBG, ED     Status: Abnormal   Collection Time: 11/15/24  2:51 PM  Result Value Ref Range   Glucose-Capillary 44 (LL) 70 - 99 mg/dL    Comment: Glucose reference range applies only to samples taken after fasting for at least 8 hours.   Comment 1 Notify RN   POC CBG, ED     Status: None   Collection Time: 11/15/24  3:49 PM  Result Value Ref Range   Glucose-Capillary 98 70 - 99 mg/dL    Comment: Glucose reference range applies only to samples taken after fasting for at least 8 hours.  Gamma GT     Status: Abnormal   Collection Time: 11/15/24  6:29 PM  Result Value Ref Range   GGT 1,582 (H) 7 - 50 U/L    Comment: Results confirmed by automated dilution  Performed at [facility], [address] [place], [place] [address]   Glucose, capillary     Status: None   Collection Time: 11/15/24  8:42 PM  Result Value Ref Range   Glucose-Capillary 73 70 - 99 mg/dL    Comment: Glucose reference range applies only to samples taken after fasting for at least 8 hours.  Glucose, capillary     Status: None   Collection Time: 11/16/24 12:12 AM  Result Value Ref Range   Glucose-Capillary 86 70 - 99 mg/dL    Comment: Glucose reference range applies only to samples taken after fasting for at least 8 hours.  Comprehensive metabolic panel     Status: Abnormal   Collection Time: 11/16/24  2:58 AM  Result Value Ref Range    Sodium 130 (L) 135 - 145 mmol/L   Potassium 4.5 3.5 - 5.1 mmol/L   Chloride 87 (L) 98 - 111 mmol/L   CO2 24 22 - 32 mmol/L   Glucose, Bld 91 70 - 99 mg/dL    Comment: Glucose reference range applies only to samples taken after fasting for at least 8 hours.   BUN 96 (H) 6 - 20 mg/dL   Creatinine, Ser 0.89 (H) 0.44 - 1.00 mg/dL   Calcium  8.7 (L) 8.9 - 10.3 mg/dL   Total Protein 5.9 (L) 6.5 - 8.1 g/dL  Albumin  2.0 (L) 3.5 - 5.0 g/dL   AST 86 (H) 15 - 41 U/L   ALT 65 (H) 0 - 44 U/L   Alkaline Phosphatase 1,114 (H) 38 - 126 U/L   Total Bilirubin 0.7 0.0 - 1.2 mg/dL   GFR, Estimated 5 (L) >60 mL/min    Comment: (NOTE) Calculated using the CKD-EPI Creatinine Equation (2021)    Anion gap 19 (H) 5 - 15    Comment: Performed at [facility], [address], [place], [place] [address]  CBC     Status: Abnormal   Collection Time: 11/16/24  2:58 AM  Result Value Ref Range   WBC 13.5 (H) 4.0 - 10.5 K/uL   RBC 2.58 (L) 3.87 - 5.11 MIL/uL   Hemoglobin 9.2 (L) 12.0 - 15.0 g/dL   HCT 74.2 (L) 63.9 - 53.9 %   MCV 99.6 80.0 - 100.0 fL   MCH 35.7 (H) 26.0 - 34.0 pg   MCHC 35.8 30.0 - 36.0 g/dL   RDW 83.5 (H) 88.4 - 84.4 %   Platelets 138 (L) 150 - 400 K/uL   nRBC 4.8 (H) 0.0 - 0.2 %    Comment: Performed at [facility], [address], [place], [place] [address]  Glucose, capillary     Status: None   Collection Time: 11/16/24  3:51 AM  Result Value Ref Range   Glucose-Capillary 92 70 - 99 mg/dL    Comment: Glucose reference range applies only to samples taken after fasting for at least 8 hours.  Glucose, capillary     Status: None   Collection Time: 11/16/24  8:22 AM  Result Value Ref Range   Glucose-Capillary 96 70 - 99 mg/dL    Comment: Glucose reference range applies only to samples taken after fasting for at least 8 hours.   Comment 1 Notify RN    Comment 2 Document in Chart   Glucose, capillary     Status: None   Collection Time: 11/16/24 11:15 AM  Result Value Ref  Range   Glucose-Capillary 92 70 - 99 mg/dL    Comment: Glucose reference range applies only to samples taken after fasting for at least 8 hours.   Comment 1 Notify RN    Comment 2 Document in Chart    *Note: Due to a large number of results and/or encounters for the requested time period, some results have not been displayed. A complete set of results can be found in Results Review.   US  Abdomen Limited RUQ (LIVER/GB) Result Date: 11/15/2024 CLINICAL DATA:  Elevated liver function tests EXAM: ULTRASOUND ABDOMEN LIMITED RIGHT UPPER QUADRANT COMPARISON:  10/24/2024, 06/25/2022 FINDINGS: Gallbladder: 15 mm shadowing gallstone is identified. No evidence of gallbladder wall thickening or pericholecystic fluid to suggest cholecystitis. Negative sonographic Murphy sign. 1.4 cm hyperechoic region arising from the ventral aspect of the gallbladder fundus consistent with adenomyomatosis as evaluated on previous MRI. Common bile duct: Diameter: 2 mm Liver: Increased liver echotexture consistent with hepatic steatosis. Portal vein is patent on color Doppler imaging with normal direction of blood flow towards the liver. Other: None. IMPRESSION: 1. Cholelithiasis without evidence of acute cholecystitis. 2. Focal adenomyomatosis of the gallbladder fundus, unchanged since numerous prior exams. 3. Increased liver echotexture consistent with hepatic steatosis. Electronically Signed   By: Ozell Daring M.D.   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  Genes Analyzed (43 total): APC*, ATM*, AXIN2, BARD1, BMPR1A, BRCA1*, BRCA2*, BRIP1*, CDH1*, CDK4, CDKN2A, CHEK2*, DICER1, GALNT12, HOXB13, MEN1, MLH1*, MRE11A, MSH2*, MSH3, MSH6*, MUTYH*, NBN, NF1*, NTHL1, PALB2*, PMS2*, POLD1, POLE, PTEN*, RAD50, RAD51C*, RAD51D*, RET, SDHB, SDHD, SMAD4, SMARCA4, STK11 and TP53* (sequencing and deletion/duplication); EGFR (s   GERD 07/24/2006   GRAVE'S DISEASE 01/01/2008   History of hidradenitis suppurativa    History of kidney stones    History of thrush    HIV DISEASE 07/24/2006   dx March 05   Hyperlipidemia    HYPERTENSION 07/24/2006   Hyperthyroidism 08/2006   Grave's Disease -diffuse radiotracer uptake 08/25/06 Thyroid  scan-Cold nodule to R lower lobe of thyrorid   Ileus (HCC) 02/14/2023   Left bundle branch block (LBBB)    Malnutrition of moderate degree 02/08/2023   Menometrorrhagia    hx of   Nephrolithiasis    Panniculitis 05/12/2020   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 Completed 2 yrs Prednisone  after lung bx confirmation. No symptoms since then.   Sebaceous cyst 04/21/2020   Shock (HCC) 09/23/2024   Suppurative hidradenitis    Thyroid  cancer (HCC)    THYROID  NODULE, RIGHT 02/08/2007    PSH:   Past Surgical History:  Procedure Laterality Date   AORTIC ARCH DEBRANCHING N/A 08/25/2023   Procedure: AORTIC ARCH DEBRANCHING USING 12X8X8MM HEMASHIELD GRAFT;  Surgeon: Lucas Dorise POUR, MD;  Location: [facility] OR;  Service: Open Heart Surgery;  Laterality: N/A;  with bypasses to the innominate and left common carotid arteries   APPLICATION OF WOUND VAC N/A 01/20/2021   Procedure: APPLICATION OF WOUND VAC;  Surgeon: Lowery Estefana RAMAN, DO;  Location: [facility];  Service: Plastics;  Laterality: N/A;   BREAST EXCISIONAL BIOPSY Right 04/26/2018   right  axilla negative   BREAST EXCISIONAL BIOPSY Left 04/26/2018   left axilla negative   BREAST LUMPECTOMY Right 10/03/2018   malignant   BREAST LUMPECTOMY WITH RADIOACTIVE SEED AND SENTINEL LYMPH NODE BIOPSY Right 10/03/2018   Procedure: RIGHT BREAST LUMPECTOMY WITH RADIOACTIVE SEED AND SENTINEL LYMPH NODE MAPPING;  Surgeon: Vanderbilt Ned, MD;  Location: [facility] OR;  Service: General;  Laterality: Right;   BREAST SURGERY  1997   Breast Reduction    CAPD INSERTION N/A 12/21/2023   Procedure: LAPAROSCOPIC INSERTION CONTINUOUS AMBULATORY PERITONEAL DIALYSIS  (CAPD) CATHETER; LAPAROSCOPIC OMENTUMPEXY;  Surgeon: Magda Ned SAILOR, MD;  Location: [facility] OR;  Service: Vascular;  Laterality: N/A;   CYSTOSCOPY W/ URETERAL STENT REMOVAL  11/09/2012   Procedure: CYSTOSCOPY WITH STENT REMOVAL;  Surgeon: Ricardo Likens, MD;   Location: [facility];  Service: Urology;  Laterality: Right;   CYSTOSCOPY WITH RETROGRADE PYELOGRAM, URETEROSCOPY AND STENT PLACEMENT  11/09/2012   Procedure: CYSTOSCOPY WITH RETROGRADE PYELOGRAM, URETEROSCOPY AND STENT PLACEMENT;  Surgeon: Ricardo Likens, MD;  Location: [facility];  Service: Urology;  Laterality: Left;  LEFT URETEROSCOPY, STONE MANIPULATION, left STENT exchange    CYSTOSCOPY WITH STENT PLACEMENT  10/02/2012   Procedure: CYSTOSCOPY WITH STENT PLACEMENT;  Surgeon: Ricardo Likens, MD;  Location: [facility];  Service: Urology;  Laterality: Left;   DEBRIDEMENT AND CLOSURE WOUND N/A 01/20/2021   Procedure: Excision of abdominal wound with closure;  Surgeon: Lowery Estefana RAMAN, DO;  Location: [facility];  Service: Plastics;  Laterality: N/A;   DILATION AND CURETTAGE OF UTERUS  11/2002   s/p for 1st trimester nonviable pregnancy   ESOPHAGOGASTRODUODENOSCOPY N/A 09/11/2024   Procedure: EGD (ESOPHAGOGASTRODUODENOSCOPY);  Surgeon: Abran Norleen SAILOR, MD;  Location: [facility];  Service: Gastroenterology;  Laterality: N/A;   EYE SURGERY     sty under eyelid   INCISE AND DRAIN ABCESS  08/2002   s/p I &D for righ inframmary fold hidradenitis   INCISION AND DRAINAGE PERITONSILLAR ABSCESS  12/2001   IR CV LINE INJECTION  06/07/2018   IR FLUORO GUIDE CV LINE RIGHT  01/30/2023   IR IMAGING GUIDED PORT INSERTION  06/20/2018   IR REMOVAL TUN ACCESS W/ PORT W/O FL MOD SED  06/20/2018   IR US  GUIDE VASC ACCESS RIGHT  01/30/2023   IRRIGATION AND DEBRIDEMENT ABSCESS  01/31/2012   Procedure: IRRIGATION AND DEBRIDEMENT ABSCESS;  Surgeon: Alm VEAR Angle, MD;  Location: [facility];  Service: General;  Laterality: Right;  right breast and axilla    LAPAROSCOPIC REPOSITIONING CAPD CATHETER Left 01/10/2024   Procedure: Removal of peritoneal dialysis Catheter.;  Surgeon: Magda Ned SAILOR, MD;  Location: [facility];  Service: Vascular;  Laterality: Left;   LAPAROSCOPY N/A 01/10/2024   Procedure: LAPAROSCOPY,  DIAGNOSTIC;  Surgeon: Magda Ned SAILOR, MD;  Location: [facility] OR;  Service: Vascular;  Laterality: N/A;   NEPHROLITHOTOMY  10/02/2012   Procedure: NEPHROLITHOTOMY PERCUTANEOUS;  Surgeon: Ricardo Likens, MD;  Location: [facility];  Service: Urology;  Laterality: Right;  First Stage Percutaneous Nephrolithotomy with Surgeon Access, Left Ureteral Stent     NEPHROLITHOTOMY  10/04/2012   Procedure: NEPHROLITHOTOMY PERCUTANEOUS SECOND LOOK;  Surgeon: Ricardo Likens, MD;  Location: [facility];  Service: Urology;  Laterality: Right;      NEPHROLITHOTOMY  10/08/2012   Procedure: NEPHROLITHOTOMY PERCUTANEOUS;  Surgeon: Ricardo Likens, MD;  Location: [facility];  Service: Urology;  Laterality: Right;  THIRD STAGE, nephrostomy tube exchange x 2   NEPHROLITHOTOMY  10/11/2012   Procedure: NEPHROLITHOTOMY PERCUTANEOUS SECOND LOOK;  Surgeon:  Ricardo Likens, MD;  Location: [facility];  Service: Urology;  Laterality: Right;  RIGHT 4 STAGE PERCUTANOUS NEPHROLITHOTOMY, right URETEROSCOPY WITH HOLMIUM LASER    PANNICULECTOMY N/A 12/21/2020   Procedure: PANNICULECTOMY;  Surgeon: Lowery Estefana RAMAN, DO;  Location: [facility] OR;  Service: Plastics;  Laterality: N/A;   PERCUTANEOUS NEPHROSTOLITHOTOMY  04/2022   PORT-A-CATH REMOVAL N/A 07/16/2020   Procedure: REMOVAL PORT-A-CATH;  Surgeon: Vanderbilt Ned, MD;  Location: [facility];  Service: General;  Laterality: N/A;   PORTACATH PLACEMENT Left 05/17/2018   Procedure: INSERTION PORT-A-CATH;  Surgeon: Vernetta Berg, MD;  Location: [facility];  Service: General;  Laterality: Left;   RADICAL NECK DISSECTION  04/12/2017   limited/notes 04/12/2017   RADICAL NECK DISSECTION N/A 04/12/2017   Procedure: RADICAL NECK DISSECTION;  Surgeon: Carlie Clark, MD;  Location: [facility] OR;  Service: ENT;  Laterality: N/A;  limited neck dissection 2 hours total   REDUCTION MAMMAPLASTY Bilateral 1998   RIGHT/LEFT HEART CATH AND CORONARY ANGIOGRAPHY N/A 03/12/2020   Procedure:  RIGHT/LEFT HEART CATH AND CORONARY ANGIOGRAPHY;  Surgeon: Cherrie Toribio SAUNDERS, MD;  Location: [facility];  Service: Cardiovascular;  Laterality: N/A;   RIGHT/LEFT HEART CATH AND CORONARY ANGIOGRAPHY N/A 09/26/2024   Procedure: RIGHT/LEFT HEART CATH AND CORONARY ANGIOGRAPHY;  Surgeon: Darron Deatrice LABOR, MD;  Location: [facility];  Service: Cardiovascular;  Laterality: N/A;   [place]  1994   TEE WITHOUT CARDIOVERSION N/A 08/25/2023   Procedure: TRANSESOPHAGEAL ECHOCARDIOGRAM;  Surgeon: Lucas Dorise POUR, MD;  Location: [facility] OR;  Service: Open Heart Surgery;  Laterality: N/A;   TENCKHOFF CATHETER INSERTION Left 01/10/2024   Procedure: INSERTION, CATHETER, DIALYSIS, PERITONEAL;  Surgeon: Magda Ned SAILOR, MD;  Location: [facility] OR;  Service: Vascular;  Laterality: Left;   THORACIC AORTIC ENDOVASCULAR STENT GRAFT N/A 02/02/2023   Procedure: THORACIC AORTIC ENDOVASCULAR STENT GRAFT;  Surgeon: Lanis Fonda BRAVO, MD;  Location: [facility] OR;  Service: Vascular;  Laterality: N/A;   THORACIC AORTIC ENDOVASCULAR STENT GRAFT N/A 08/25/2023   Procedure: THORACIC AORTIC ENDOVASCULAR STENT GRAFT;  Surgeon: Lanis Fonda BRAVO, MD;  Location: [facility] OR;  Service: Vascular;  Laterality: N/A;   THYROIDECTOMY  04/12/2017   completion/notes 04/12/2017   THYROIDECTOMY N/A 04/12/2017   Procedure: THYROIDECTOMY;  Surgeon: Carlie Clark, MD;  Location: [facility] OR;  Service: ENT;  Laterality: N/A;  Completion Thyroidectomy   TOTAL THYROIDECTOMY  2010   ULTRASOUND GUIDANCE FOR VASCULAR ACCESS Right 08/25/2023   Procedure: ULTRASOUND GUIDANCE FOR VASCULAR ACCESS, RIGHT FEMORAL ARTERY;  Surgeon: Lanis Fonda BRAVO, MD;  Location: [facility] OR;  Service: Vascular;  Laterality: Right;    Allergies: Allergies[1]  Medications:   Prior to Admission medications  Medication Sig Start Date End Date Taking? Authorizing Provider  acetaminophen  (TYLENOL ) 325 MG tablet Take 1-2 tablets (325-650 mg total) by mouth every 4 (four) hours as needed for mild pain  (pain score 1-3). 10/07/24  Yes Jerilynn Daphne SAILOR, NP  albuterol  (VENTOLIN  HFA) 108 (90 Base) MCG/ACT inhaler Inhale 2 puffs into the lungs every 6 (six) hours as needed for wheezing or shortness of breath.   Yes [provider]  allopurinol  (ZYLOPRIM ) 100 MG tablet TAKE 1 TABLET (100 MG TOTAL) BY MOUTH DAILY. FOR GOUT PREVENTION 04/11/24  Yes Clark, Katherine K, NP  aspirin  81 MG chewable tablet Chew 1 tablet (81 mg total) by mouth daily. 10/07/24  Yes Jerilynn Daphne SAILOR, NP  busPIRone  (BUSPAR ) 5 MG tablet Take 2 tablets (10 mg total) by mouth 2 (  two) times daily. 10/07/24  Yes Jerilynn Daphne SAILOR, NP  calcitRIOL  (ROCALTROL ) 0.25 MCG capsule Take 0.25 mcg by mouth daily. 08/07/24  Yes [provider]  dolutegravir  ([name] ) 50 MG tablet Take 1 tablet (50 mg total) by mouth daily. 05/13/24  Yes Melvenia Corean SAILOR, NP  emtricitabine -tenofovir  AF (DESCOVY ) 200-25 MG tablet Take 1 tablet by mouth daily. 05/13/24  Yes Melvenia Corean SAILOR, NP  hydrocortisone  (CORTEF ) 5 MG tablet Take 3 tablets (15 mg total) by mouth 2 (two) times daily. Patient taking differently: Take 5-15 mg by mouth See admin instructions. Taking 3 tablets (15 mg) by mouth when PT wakes up in the AM and 1 tablet (5 mg) at 3 pm. 10/07/24  Yes Jerilynn Daphne SAILOR, NP  levocetirizine (XYZAL ) 5 MG tablet TAKE 1 TABLET BY MOUTH EVERY DAY IN THE EVENING FOR ALLERGIES Patient taking differently: Take 5 mg by mouth every evening. 05/12/24  Yes Clark, Katherine K, NP  levothyroxine  (SYNTHROID ) 125 MCG tablet Take 125 mcg by mouth daily.   Yes [provider]  loratadine  (CLARITIN ) 10 MG tablet Take 1 tablet by mouth daily. 09/27/24  Yes [provider]  losartan  (COZAAR ) 25 MG tablet Take 25 mg by mouth daily. 11/14/24  Yes [provider]  metoprolol  (TOPROL -XL) 200 MG 24 hr tablet Take 200 mg by mouth daily.   Yes [provider]  mirtazapine  (REMERON ) 7.5 MG tablet Take 7.5 mg by mouth at  bedtime. 10/07/24  Yes [provider]  multivitamin (RENA-VIT) TABS tablet Take 1 tablet by mouth at bedtime. 10/07/24  Yes Jerilynn Daphne SAILOR, NP  omeprazole  (PRILOSEC) 40 MG capsule Take 1 capsule (40 mg total) by mouth 2 (two) times daily. for heartburn. 09/12/24  Yes Ghimire, Donalda HERO, MD  rosuvastatin  (CRESTOR ) 10 MG tablet TAKE 1 TABLET BY MOUTH EVERY DAY FOR CHOLESTEROL 03/21/24  Yes Clark, Katherine K, NP  famotidine  (PEPCID ) 10 MG tablet Take 1 tablet (10 mg total) by mouth daily. Patient not taking: Reported on 11/15/2024 10/08/24   Jerilynn Daphne SAILOR, NP  feeding supplement (ENSURE PLUS HIGH PROTEIN) LIQD Take 237 mLs by mouth 2 (two) times daily between meals. Patient not taking: Reported on 11/15/2024 10/07/24   Jerilynn Daphne SAILOR, NP  fludrocortisone  (FLORINEF ) 0.1 MG tablet Take 1 tablet (0.1 mg total) by mouth daily. Patient not taking: Reported on 11/15/2024 10/07/24   Jerilynn Daphne SAILOR, NP  fluticasone  (FLOVENT  HFA) 110 MCG/ACT inhaler Inhale 1 puff into the lungs 2 (two) times daily. Patient not taking: Reported on 11/15/2024    [provider]  levothyroxine  (SYNTHROID ) 150 MCG tablet TAKE 1 TABLET BY MOUTH EVERY DAY BEFORE BREAKFAST Patient not taking: Reported on 11/15/2024 10/14/24   Trixie File, MD  lidocaine  (LIDODERM ) 5 % PLACE 1 PATCH ONTO THE SKIN DAILY. REMOVE & DISCARD PATCH WITHIN 12 HOURS OR AS DIRECTED BY MD Patient not taking: Reported on 11/15/2024 04/21/24   Clark, Katherine K, NP  mirtazapine  (REMERON ) 15 MG tablet Take 1 tablet (15 mg total) by mouth at bedtime. For appetite. Patient not taking: Reported on 11/15/2024 10/25/24   Clark, Katherine K, NP  ondansetron  (ZOFRAN -ODT) 4 MG disintegrating tablet Take 1 tablet (4 mg total) by mouth every 8 (eight) hours as needed for nausea or vomiting. Patient not taking: Reported on 11/15/2024 10/25/24   Clark, Katherine K, NP  oxyCODONE  (OXY IR/ROXICODONE ) 5 MG immediate release tablet Take 1 tablet (5 mg  total) by mouth every 6 (six) hours as needed for  moderate pain (pain score 4-6). Patient not taking: Reported on 11/15/2024 10/07/24   Jerilynn Daphne SAILOR, NP  saccharomyces boulardii (FLORASTOR) 250 MG capsule Take 1 capsule (250 mg total) by mouth daily. Patient not taking: Reported on 11/15/2024 10/08/24   Jerilynn Daphne SAILOR, NP  sevelamer  carbonate (RENVELA ) 800 MG tablet Take 2 tablets (1,600 mg total) by mouth 3 (three) times daily with meals. Patient not taking: Reported on 11/15/2024 10/07/24   Jerilynn Daphne SAILOR, NP  traZODone  (DESYREL ) 50 MG tablet Take 0.5 tablets (25 mg total) by mouth at bedtime as needed for sleep. Patient not taking: Reported on 11/15/2024 10/07/24   Jerilynn Daphne SAILOR, NP  budesonide  (PULMICORT ) 0.25 MG/2ML nebulizer solution Take 2 mLs (0.25 mg total) by nebulization 2 (two) times daily. 10/07/24 10/07/24  Jerilynn Daphne SAILOR, NP    Discontinued Meds:   Medications Discontinued During This Encounter  Medication Reason   Lancets (ONETOUCH ULTRASOFT) lancets Patient Preference   Darbepoetin Alfa  (ARANESP ) 100 MCG/0.5ML SOSY injection    hydrocortisone  (CORTEF ) tablet 5-15 mg Duplicate    Social History:  reports that she quit smoking about 7 years ago. Her smoking use included cigarettes. She started smoking about 7 years ago. She has a 7.5 pack-year smoking history. She has never used smokeless tobacco. She reports that she does not currently use alcohol . She reports that she does not use drugs.  Family History:   Family History  Problem Relation Age of Onset   Hypertension Mother    Cancer Mother        laryngeal   Heart disease Mother        stent   Pancreatic cancer Father    Hypertension Father    Lung cancer Father 67       hx smoking   Hypertension Sister    Hypertension Sister    Hypertension Brother    Breast cancer Maternal Aunt 3   Breast cancer Maternal Aunt        dx 60+   Cancer Maternal Uncle        Lung CA   Breast cancer Paternal  Aunt 64   Breast cancer Paternal Aunt        dx 8's   Breast cancer Paternal Aunt        dx 50's   Prostate cancer Paternal Uncle    Prostate cancer Paternal Uncle    Lung cancer Paternal Uncle    Breast cancer Cousin 75   Breast cancer Cousin        dx <50   Breast cancer Cousin        dx <50   Breast cancer Cousin        dx <50   Heart disease Other    Hypertension Other    Stroke Other        Grandparent   Kidney disease Other        Grandparent   Diabetes Other        FH of Diabetes   Colon cancer Neg Hx    Esophageal cancer Neg Hx    Rectal cancer Neg Hx    Stomach cancer Neg Hx     Blood pressure (!) 121/50, pulse 83, temperature (P) 98.6 F (37 C), temperature source (P) Oral, resp. rate 15, height 5' 5 (1.651 m), weight 75.6 kg, last menstrual period 03/31/2014, SpO2 96%. Physical Exam: General appearance: NAD Head: NCAT Neck: no bruit, supple Back: No CVA tenderness. Resp: CTA b/l Cardio: regular rate and  rhythm GI: soft, non-tender; bowel sounds normal Extremities: extremities normal, atraumatic, no cyanosis or edema Pulses: 2+ and symmetric    Caz Weaver, LYNWOOD ORN, MD 11/16/2024, 11:52 AM      [1]  Allergies Allergen Reactions   Genvoya [Elviteg-Cobic-Emtricit-Tenofaf] Hives   Lisinopril Cough   Aldactone  [Spironolactone ] Hives   Valium  [Diazepam ] Other (See Comments)    Lethargy (pt states she cannot take this again)   Tegaderm Ag Mesh [Silver] Itching

## 2024-11-16 NOTE — Progress Notes (Signed)
 "  PROGRESS NOTE    [name]  FMW:[id number] DOB: May 31, 1966 DOA: 11/15/2024 PCP: Gretta Comer POUR, NP   Brief Narrative: [name] is a 59 y.o. female with a history of hypertension, hyperlipidemia, diabetes mellitus, hypothyroidism, HIV, ESRD on PD, chronic systolic heart failure, anxiety, depression, dysphagia, aortic aneurysm, aortic dissection s/p repair, pituitary apoplexy, adrenal insufficiency, sarcoid, anemia, breast cancer, gout, asthma.  Patient presented secondary to weakness and fatigue, found to have hyperkalemia with mild hyponatremia. During workup, patient was also found to have a significantly elevated alkaline phosphatase with elevated AST/ALT. GI consulted.   Assessment and Plan:  Hyperkalemia Unclear etiology but in relation to renal disease. Resolved with with Lokelma  and dialysis.  ESRD on PD Nephrology consulted for peritoneal dialysis.  Hyponatremia Mild. Management with peritoneal dialysis.  Diabetes mellitus type 2 Well controlled based on most recent hemoglobin A1C of 5.6%.  Dysphagia Esophageal dysmotility Patient with workup at [facility]. Recommendation at that time was for discharge with an NG tube with home health services, in addition to outpatient follow-up for manometry. - Continue tube feeds - Renal diet - Dietician recommendations  Elevated alkaline phosphatase Elevated AST/ALT Unclear etiology. GGT elevated, suggesting biliary source. RUQ ultrasound significant for cholelithiasis without evidence of cholecystitis; patient without pain.  - GI consultation  Pituitary apoplexy Adrenal insufficiency - Continue hydrocortisone  (15 mg q0600 and 5 mg q1400)  Hypotension Likely related to adrenal insufficiency. Patient started on midodrine  this admission. - Continue midodrine  for now - Continue treatment of adrenal sufficiency  Hypothyroidism S/p thyroidectomy. - Continue Synthroid   Chronic HFrEF LVEF of 20% on most  recent Transthoracic Echocardiogram from admission at [facility]. Not able to tolerate goal directed medical therapy secondary to hypotension. - Strict in/out  HIV disease - Continue Descovy  and [name]   History of aortic dissection Aortic aneurysm S/p repair of dissection. - Continue aspirin   Chronic anemia Likely anemia of chronic kidney disease. Hemoglobin is stable.  Asthma - Continue albuterol  as needed  Gout - Continue allopurinol   Depression Anxiety - Continue Remeron  and BuSpar   History of breast cancer Patient is s/p lumpectomy, chemotherapy and immunotherapy     DVT prophylaxis: Subcutaneous heparin  Code Status:   Code Status: Full Code Family Communication: None at bedside Disposition Plan: Discharge pending ongoing biliary workup, PT/OT recommendations, specialist recommendations   Consultants:  Nephrology Gastroenterology  Procedures:  Peritoneal dialysis  Antimicrobials: None    Subjective: Patient reports decreased appetite. No abdominal pain.  Objective: BP (!) 103/47 (BP Location: Right Arm)   Pulse 86   Temp 98.3 F (36.8 C)   Resp (!) 21   Ht 5' 5 (1.651 m)   Wt 75.6 kg   LMP 03/31/2014   SpO2 96%   BMI 27.73 kg/m   Examination:  General exam: Appears calm and comfortable. Respiratory system: Clear to auscultation. Respiratory effort normal. Cardiovascular system: S1 & S2 heard, RRR. Gastrointestinal system: Abdomen is nondistended, soft and nontender. Normal bowel sounds heard. Central nervous system: Alert and oriented. No focal neurological deficits. Psychiatry: Judgement and insight appear normal. Mood & affect appropriate.    Data Reviewed: I have personally reviewed following labs and imaging studies  CBC Lab Results  Component Value Date   WBC 13.5 (H) 11/16/2024   RBC 2.58 (L) 11/16/2024   HGB 9.2 (L) 11/16/2024   HCT 25.7 (L) 11/16/2024   MCV 99.6 11/16/2024   MCH 35.7 (H) 11/16/2024   PLT 138 (L) 11/16/2024    MCHC 35.8 11/16/2024  RDW 16.4 (H) 11/16/2024   LYMPHSABS 1.5 11/15/2024   MONOABS 0.9 11/15/2024   EOSABS 0.1 11/15/2024   BASOSABS 0.0 11/15/2024     Last metabolic panel Lab Results  Component Value Date   NA 130 (L) 11/16/2024   K 4.5 11/16/2024   CL 87 (L) 11/16/2024   CO2 24 11/16/2024   BUN 96 (H) 11/16/2024   CREATININE 9.10 (H) 11/16/2024   GLUCOSE 91 11/16/2024   GFRNONAA 5 (L) 11/16/2024   GFRAA 39 (L) 07/14/2020   CALCIUM  8.7 (L) 11/16/2024   PHOS 5.9 (H) 10/04/2024   PROT 5.9 (L) 11/16/2024   ALBUMIN  2.0 (L) 11/16/2024   BILITOT 0.7 11/16/2024   ALKPHOS 1,114 (H) 11/16/2024   AST 86 (H) 11/16/2024   ALT 65 (H) 11/16/2024   ANIONGAP 19 (H) 11/16/2024    GFR: Estimated Creatinine Clearance: 7 mL/min (A) (by C-G formula based on SCr of 9.1 mg/dL (H)).  No results found for this or any previous visit (from the past 240 hours).    Radiology Studies: US  Abdomen Limited RUQ (LIVER/GB) Result Date: 11/15/2024 CLINICAL DATA:  Elevated liver function tests EXAM: ULTRASOUND ABDOMEN LIMITED RIGHT UPPER QUADRANT COMPARISON:  10/24/2024, 06/25/2022 FINDINGS: Gallbladder: 15 mm shadowing gallstone is identified. No evidence of gallbladder wall thickening or pericholecystic fluid to suggest cholecystitis. Negative sonographic Murphy sign. 1.4 cm hyperechoic region arising from the ventral aspect of the gallbladder fundus consistent with adenomyomatosis as evaluated on previous MRI. Common bile duct: Diameter: 2 mm Liver: Increased liver echotexture consistent with hepatic steatosis. Portal vein is patent on color Doppler imaging with normal direction of blood flow towards the liver. Other: None. IMPRESSION: 1. Cholelithiasis without evidence of acute cholecystitis. 2. Focal adenomyomatosis of the gallbladder fundus, unchanged since numerous prior exams. 3. Increased liver echotexture consistent with hepatic steatosis. Electronically Signed   By: Ozell Daring M.D.   On:  11/15/2024 21:18      LOS: 0 days    Elgin Lam, MD [facility] 11/16/2024, 7:40 AM   If 7PM-7AM, please contact night-coverage [email or web address]  "

## 2024-11-16 NOTE — Progress Notes (Signed)
 PD post treatment note  PD treatment completed. Patient tolerated treatment well. PD effluent is clear. No specimen collected.  PD exit site clean, dry and intact. Patient is awake, oriented and in no acute distress.  Report given to bedside nurse.     11/16/24 9261  Peritoneal Catheter Mid lower abdomen  Placement Date/Time: 01/10/24 1213   Serial / Lot #: [id number]  Expiration Date: 04/15/28  Procedural Verification: Medical records & consent reviewed;Relevant studies,results and images reviewed;Site marked with initials  Time out: Correct Patient;Corre...  Site Assessment Clean, Dry, Intact  Completion  Treatment Status Complete  Initial Drain Volume 1  Average Dwell Time-Hour(s) 1  Average Dwell Time-Min(s) 30  Average Drain Time 30  Total Therapy Volume 88748  Total Therapy Time-Hour(s) 11  Total Therapy Time-Min(s) 21  Weight after Drain 166 lb 10.7 oz (75.6 kg)  Effluent Appearance Clear;Yellow  Cell Count on Daytime Exchange N/A  Fluid Balance - CCPD  Total UF (- value on cycler, pt gain) 77 mL  Procedure Comments  Tolerated treatment well? Yes  Hand-off documentation  Hand-off Given Given to shift RN/LPN  Report given to (Full Name) Delon Pass, RN

## 2024-11-16 NOTE — Plan of Care (Signed)
  Problem: Education: Goal: Knowledge of General Education information will improve Description: Including pain rating scale, medication(s)/side effects and non-pharmacologic comfort measures Outcome: Progressing   Problem: Health Behavior/Discharge Planning: Goal: Ability to manage health-related needs will improve Outcome: Progressing   Problem: Clinical Measurements: Goal: Ability to maintain clinical measurements within normal limits will improve Outcome: Progressing Goal: Will remain free from infection Outcome: Progressing Goal: Diagnostic test results will improve Outcome: Progressing Goal: Respiratory complications will improve Outcome: Progressing Goal: Cardiovascular complication will be avoided Outcome: Progressing   Problem: Activity: Goal: Risk for activity intolerance will decrease Outcome: Progressing   Problem: Elimination: Goal: Will not experience complications related to bowel motility Outcome: Progressing Goal: Will not experience complications related to urinary retention Outcome: Progressing   Problem: Pain Managment: Goal: General experience of comfort will improve and/or be controlled Outcome: Progressing   Problem: Safety: Goal: Ability to remain free from injury will improve Outcome: Progressing   Problem: Skin Integrity: Goal: Risk for impaired skin integrity will decrease Outcome: Progressing

## 2024-11-16 NOTE — Progress Notes (Signed)
 Initial Nutrition Assessment  DOCUMENTATION CODES:   Not applicable  INTERVENTION:  Continue tube feeding via small bore feeding tube: -Nepro Carb Steady at 60 ml/h x16 hours 6PM-10AM (960 ml per day) -Prosource TF20 60 ml daily -MONITOR NEED FOR FWF -Provides 1779 kcal, 98 gm protein, 698 ml free water  daily   Add on PHOS/Mg to morning lab draw to assess for refeeding; recommend continuing to monitor as refeeding may be delayed in presence of renal dysfunction  Add thiamine  100mg  x5 days  Continue liberalized diet to maximize PO intake  Encourage PO intake  Automatic meal trays to avoid missed meals  Add renal MVI w/ minerals  Continue appetite stimulant  If long-term nutrition support remains indicated/preferred, will need to discuss PEG placement (may be contraindicated in PD patients)  NUTRITION DIAGNOSIS:  Inadequate oral intake related to poor appetite as evidenced by per patient/family report, meal completion < 25%.  GOAL:  Patient will meet greater than or equal to 90% of their needs  MONITOR:  TF tolerance, Weight trends, Labs, Skin, PO intake  REASON FOR ASSESSMENT:   Consult Enteral/tube feeding initiation and management  ASSESSMENT:   Pt with PMH significant for: ESRD on PD, HIV, aortic dissection s/p endovascular repair (08/25/23), recent h/o pituitary apoplexy, HTN, papillary thyroid  carcinoma s/p thyroidectomy, sarcoidosis, DM2. Presenting with weakness and fatigue after d/c from prolonged admission at [facility] (1/13-129). Admitted for electrolyte derangement: hypoglycemia and hyperkalemia.  Was discharged from [facility] less than 24 hours. Noted with multiple admissions in recent past. Averaging at least one hospitalization per month since October of last year. Returned after family c/f continued weakness and fatigue.    Recently discharged from [facility] with small bore nasogastric feeding tube in post pyloric position. Home TF regimen ordered as Nepro Carb Steady  (4.5 cartons daily) via pump. Ordered at  21ml/hr x16 hours to allow for time off the pump. Previously, was running at 84ml/hr continuously. Home regimen has been re-initiated upon admission here.    RD working remotely and, thus, chart review performed to obtain recent nutrition hx. Missed her PD session between discharge from [facility] and presenting to [facility]. Appears patient has had several months of poor PO intake and declining nutrition status. Was refusing most meals at [facility]. Hx of dysphagia, however MBS at [facility] on 1/21 showed mild dysmotility and barium tablet lodged at GE junction. SLP, however, recommend regular diet, thin liquids as tolerated. Dobhoff eventually placed. Unclear as to why post-pyloric positioning was preferred. Plan was for manometry follow up as outpatient.   She is low risk for refeeding as she was receiving her TF, as ordered, upon discharge from [facility]. Family does endorse that only of volume infused overnight between admissions. Electrolytes being monitored. Will add PHOS/Mg to this morning's lab draw. Noted some low blood sugars. Will likely stabilize with tube feeds as well as regular PD txs as some absorption of dextrose  solution will occur.  Admit weight: 75.6 kg  Current weight: 75.6 kg  No significant edema documented. Per chart review, she has shown 9.6% weight loss in last three months. This is considered clinically significant for the time frame, however cannot rule out fluid shifts as a factor that may be skewing this trend. Therefore, cannot be used as prognostic indicator for malnutrition.   Will re-assess whether she meets malnutrition criteria when NFPE able to be completed. She is at high risk for malnutrition given chronicity of her comorbid conditions and documented decline in PO intake over last  several months.   Transitioned to PD from HD back in April of 2025. Concern for PD modality failure during admission back in November given her symptoms and lab  trends consistent with uremia and inadequate dialysis. Will continue to monitor clearance trends while admitted. Has received Lokelma  and calcium  gluconate thus far this admission. Will start thiamine  due to low risk of refeeding.   Hospital CCPD plan: 5 cycles 1.5 hours per dwell time 2.5% concentration 2.25L night fill volume PD on current admit regimen provides:  281g dextrose  383-484 kcal  Drains/Lines: Dobhoff small bore feeding tube (post-pyloric)  Nutritionally Relevant Medications: Scheduled Meds:  dolutegravir   50 mg Oral Daily   emtricitabine -tenofovir  AF  1 tablet Oral Daily   feeding supplement (NEPRO CARB STEADY)  1,000 mL Per Tube q1800   hydrocortisone   5 mg Oral q1800   hydrocortisone   5 mg Oral Daily   levothyroxine   125 mcg Oral Q0600   mirtazapine   15 mg Oral QHS   multivitamin  1 tablet Oral QHS   pantoprazole   40 mg Oral Daily   Continuous Infusions:  dextrose  75 mL/hr at 11/16/24 0600   dialysis solution 1.5% low-MG/low-CA     dialysis solution 2.5% low-MG/low-CA     PRN Meds: polyethylene glycol  Labs Reviewed: K+ 5.8>4.5 (wdl) PHOS add-on Mg add-on Corr Ca 10.3 (H) AST 103>86 (H) ALT 78>65 (H) Alk phos 1114 (H) WBC 13.5 (H) CBG ranges from 44-96 mg/dL over the last 24 hours HgbA1c 5.6 (09/2024)    NUTRITION - FOCUSED PHYSICAL EXAM:  Defer to in person follow up  Diet Order:   Diet Order             Diet regular Room service appropriate? Yes; Fluid consistency: Thin; Fluid restriction: 2000 mL Fluid  Diet effective now                   EDUCATION NEEDS:  Not appropriate for education at this time  Skin:  Skin Assessment: Reviewed RN Assessment  Last BM:  1/30  Height:  Ht Readings from Last 1 Encounters:  11/15/24 5' 5 (1.651 m)   Weight:  Wt Readings from Last 1 Encounters:  11/16/24 75.6 kg   Ideal Body Weight:  56.8 kg  BMI:  Body mass index is 27.73 kg/m.  Estimated Nutritional Needs:   Kcal:  1800-2000  kcals  Protein:  90-105g  Fluid:  1L + UOP  Blair Deaner MS, RD, LDN Registered Dietitian I Clinical Nutrition RD Inpatient Contact Info in Amion

## 2024-11-16 NOTE — Progress Notes (Signed)
 PD tx initation note:     11/16/24 2007  Peritoneal Catheter Mid lower abdomen  Placement Date/Time: 01/10/24 1213   Serial / Lot #: [id number]  Expiration Date: 04/15/28  Procedural Verification: Medical records & consent reviewed;Relevant studies,results and images reviewed;Site marked with initials  Time out: Correct Patient;Corre...  Site Assessment Clean, Dry, Intact  Drainage Description None  Catheter status Accessed  Dressing Gauze/Drain sponge  Dressing Status Clean, Dry, Intact  Dressing Intervention Assessed, no intervention needed (AM Nurse has changed dressing and applied gentymycin)  Cycler Setup  Total Number of Night Cycles 5  Night Fill Volume 2250  Dianeal  Solution Dextrose  2.5% in 6000 mL Low Cal/Low Mag (2.5% for 2/3rds and 1.5% last 1/3rd)  Night Dwell Time per Cycle - Hour(s) 1  Night Dwell Time per Cycle - Minute(s) 30  Night Time Therapy - Minute(s) 11  Night Time Therapy - Hour(s) 10  Minimum Initial Drain Volume 0  Maximum Peritoneal Volume 3500  Night/Total Therapy Volume 88749  Day Exchange No  Completion  Treatment Status Started  Hand-off documentation  Hand-off Received Received from shift RN/LPN  Report received from (Full Name) Margery Pass, RN     PD treatment initiated via aseptic technique. Consent signed and in chart. Patient is alert and oriented. No complaints of pain. No specimen collected. PD exit site clean, dry and intact. Gentamycin and new dressing applied. Bedside RN educated on PD machine and how to contact tech support when PD machine alarms.PD tx initation note:

## 2024-11-16 NOTE — Hospital Course (Signed)
 [name] is a 59 y.o. female with a history of hypertension, hyperlipidemia, diabetes mellitus, hypothyroidism, HIV, ESRD on PD, chronic systolic heart failure, anxiety, depression, dysphagia, aortic aneurysm, aortic dissection s/p repair, pituitary apoplexy, adrenal insufficiency, sarcoid, anemia, breast cancer, gout, asthma.  Patient presented secondary to weakness and fatigue, found to have hyperkalemia with mild hyponatremia. During workup, patient was also found to have a significantly elevated alkaline phosphatase with elevated AST/ALT. GI consulted.

## 2024-11-17 DIAGNOSIS — Z21 Asymptomatic human immunodeficiency virus [HIV] infection status: Secondary | ICD-10-CM | POA: Diagnosis not present

## 2024-11-17 DIAGNOSIS — E875 Hyperkalemia: Secondary | ICD-10-CM | POA: Diagnosis not present

## 2024-11-17 DIAGNOSIS — D649 Anemia, unspecified: Secondary | ICD-10-CM | POA: Diagnosis not present

## 2024-11-17 DIAGNOSIS — E1169 Type 2 diabetes mellitus with other specified complication: Secondary | ICD-10-CM | POA: Diagnosis not present

## 2024-11-17 DIAGNOSIS — K7589 Other specified inflammatory liver diseases: Secondary | ICD-10-CM

## 2024-11-17 DIAGNOSIS — R748 Abnormal levels of other serum enzymes: Secondary | ICD-10-CM | POA: Diagnosis not present

## 2024-11-17 DIAGNOSIS — E039 Hypothyroidism, unspecified: Secondary | ICD-10-CM | POA: Diagnosis not present

## 2024-11-17 DIAGNOSIS — E46 Unspecified protein-calorie malnutrition: Secondary | ICD-10-CM | POA: Diagnosis not present

## 2024-11-17 DIAGNOSIS — R630 Anorexia: Secondary | ICD-10-CM | POA: Diagnosis not present

## 2024-11-17 DIAGNOSIS — K831 Obstruction of bile duct: Secondary | ICD-10-CM

## 2024-11-17 LAB — CORTISOL: Cortisol, Plasma: 76.3 ug/dL

## 2024-11-17 LAB — CBC
HCT: 24.1 % — ABNORMAL LOW (ref 36.0–46.0)
Hemoglobin: 8.6 g/dL — ABNORMAL LOW (ref 12.0–15.0)
MCH: 35.1 pg — ABNORMAL HIGH (ref 26.0–34.0)
MCHC: 35.7 g/dL (ref 30.0–36.0)
MCV: 98.4 fL (ref 80.0–100.0)
Platelets: 151 10*3/uL (ref 150–400)
RBC: 2.45 MIL/uL — ABNORMAL LOW (ref 3.87–5.11)
RDW: 16.3 % — ABNORMAL HIGH (ref 11.5–15.5)
WBC: 17.7 10*3/uL — ABNORMAL HIGH (ref 4.0–10.5)
nRBC: 1.9 % — ABNORMAL HIGH (ref 0.0–0.2)

## 2024-11-17 LAB — BASIC METABOLIC PANEL WITH GFR
Anion gap: 19 — ABNORMAL HIGH (ref 5–15)
BUN: 105 mg/dL — ABNORMAL HIGH (ref 6–20)
CO2: 22 mmol/L (ref 22–32)
Calcium: 8.6 mg/dL — ABNORMAL LOW (ref 8.9–10.3)
Chloride: 84 mmol/L — ABNORMAL LOW (ref 98–111)
Creatinine, Ser: 8.63 mg/dL — ABNORMAL HIGH (ref 0.44–1.00)
GFR, Estimated: 5 mL/min — ABNORMAL LOW
Glucose, Bld: 96 mg/dL (ref 70–99)
Potassium: 4.9 mmol/L (ref 3.5–5.1)
Sodium: 125 mmol/L — ABNORMAL LOW (ref 135–145)

## 2024-11-17 LAB — C DIFFICILE QUICK SCREEN W PCR REFLEX
C Diff antigen: NEGATIVE
C Diff interpretation: NOT DETECTED
C Diff toxin: NEGATIVE

## 2024-11-17 LAB — COMPREHENSIVE METABOLIC PANEL WITH GFR
ALT: 64 U/L — ABNORMAL HIGH (ref 0–44)
AST: 82 U/L — ABNORMAL HIGH (ref 15–41)
Albumin: 2.3 g/dL — ABNORMAL LOW (ref 3.5–5.0)
Alkaline Phosphatase: 1196 U/L — ABNORMAL HIGH (ref 38–126)
Anion gap: 19 — ABNORMAL HIGH (ref 5–15)
BUN: 92 mg/dL — ABNORMAL HIGH (ref 6–20)
CO2: 22 mmol/L (ref 22–32)
Calcium: 8.6 mg/dL — ABNORMAL LOW (ref 8.9–10.3)
Chloride: 82 mmol/L — ABNORMAL LOW (ref 98–111)
Creatinine, Ser: 8.3 mg/dL — ABNORMAL HIGH (ref 0.44–1.00)
GFR, Estimated: 5 mL/min — ABNORMAL LOW
Glucose, Bld: 218 mg/dL — ABNORMAL HIGH (ref 70–99)
Potassium: 4.8 mmol/L (ref 3.5–5.1)
Sodium: 124 mmol/L — ABNORMAL LOW (ref 135–145)
Total Bilirubin: 0.6 mg/dL (ref 0.0–1.2)
Total Protein: 6.6 g/dL (ref 6.5–8.1)

## 2024-11-17 LAB — GLUCOSE, CAPILLARY
Glucose-Capillary: 228 mg/dL — ABNORMAL HIGH (ref 70–99)
Glucose-Capillary: 243 mg/dL — ABNORMAL HIGH (ref 70–99)
Glucose-Capillary: 100 mg/dL — ABNORMAL HIGH (ref 70–99)
Glucose-Capillary: 135 mg/dL — ABNORMAL HIGH (ref 70–99)
Glucose-Capillary: 94 mg/dL (ref 70–99)
Glucose-Capillary: 146 mg/dL — ABNORMAL HIGH (ref 70–99)

## 2024-11-17 LAB — T4, FREE: Free T4: 0.99 ng/dL (ref 0.80–2.00)

## 2024-11-17 LAB — PROTIME-INR
INR: 1.1 (ref 0.8–1.2)
Prothrombin Time: 14.6 s (ref 11.4–15.2)

## 2024-11-17 MED ORDER — HYDROCORTISONE 5 MG PO TABS
5.0000 mg | ORAL_TABLET | Freq: Every day | ORAL | Status: DC
  Administered 2024-11-18 – 2024-11-20 (×3): 5 mg via ORAL
  Filled 2024-11-17 (×5): qty 1

## 2024-11-17 MED ORDER — INSULIN ASPART 100 UNIT/ML IJ SOLN
0.0000 [IU] | INTRAMUSCULAR | Status: AC
  Administered 2024-11-17: 2 [IU] via SUBCUTANEOUS
  Administered 2024-11-22: 0 [IU] via SUBCUTANEOUS
  Filled 2024-11-17 (×2): qty 2

## 2024-11-17 MED ORDER — HYDROCORTISONE 5 MG PO TABS
15.0000 mg | ORAL_TABLET | Freq: Every day | ORAL | Status: DC
  Administered 2024-11-18 – 2024-11-21 (×4): 15 mg via ORAL
  Filled 2024-11-17 (×4): qty 1

## 2024-11-17 NOTE — Progress Notes (Addendum)
 Patient ID: [name], female   DOB: December 30, 1965, 59 y.o.   MRN: [id number]    Progress Note   Subjective   Day # 2 CC; elevated LFT's, in patient with numerous serious medical problems  INR 1.1 WBC 17.7/hemoglobin 8.6/hematocrit 24.1 Sodium 124 Potassium 4.8/glucose 218/BUN 92/creatinine 8.3  T. bili 0.6/alk phos 1196/AST 82/ALT 64  Still no appetite - has not tried to eat anything today, no nausea, says her abdomen is fine, no abdominal pain.  Feeling a little bit better in general    Objective   Vital signs in last 24 hours: Temp:  [97.7 F (36.5 C)-98.6 F (37 C)] 97.7 F (36.5 C) (02/01 0900) Pulse Rate:  [85-95] 93 (02/01 0900) Resp:  [13-26] 13 (02/01 0900) BP: (101-139)/(40-71) 139/61 (02/01 0900) SpO2:  [97 %] 97 % (02/01 0900) Weight:  [75 kg] 75 kg (02/01 0341) Last BM Date : 11/15/24 General:    Older African-American female in NAD-feeding tube in place, chronically ill-appearing Heart:  Regular rate and rhythm; no murmurs Lungs: Respirations even and unlabored, lungs CTA bilaterally Abdomen:  Soft, obese, no focal tenderness, bowel sounds present no palpable mass or hepatosplenomegaly Extremities:  Without edema. Neurologic:  Alert and oriented,  grossly normal neurologically. Psych:  Cooperative. Normal mood and affect.  Intake/Output from previous day: 01/31 0701 - 02/01 0700 In: 2919.6 [I.V.:1462.6; NG/GT:1380] Out: -  Intake/Output this shift: No intake/output data recorded.  Lab Results: Recent Labs    11/15/24 1225 11/16/24 0258 11/17/24 0155  WBC 13.1* 13.5* 17.7*  HGB 9.3* 9.2* 8.6*  HCT 26.9* 25.7* 24.1*  PLT 142* 138* 151   BMET Recent Labs    11/15/24 1225 11/16/24 0258 11/17/24 0155  NA 131* 130* 124*  K 5.8* 4.5 4.8  CL 88* 87* 82*  CO2 23 24 22   GLUCOSE 49* 91 218*  BUN 103* 96* 92*  CREATININE 9.69* 9.10* 8.30*  CALCIUM  8.9 8.7* 8.6*   LFT Recent Labs    11/17/24 0155  PROT 6.6  ALBUMIN  2.3*  AST 82*   ALT 64*  ALKPHOS 1,196*  BILITOT 0.6   PT/INR Recent Labs    11/17/24 0155  LABPROT 14.6  INR 1.1    Studies/Results: US  Abdomen Limited RUQ (LIVER/GB) Result Date: 11/15/2024 CLINICAL DATA:  Elevated liver function tests EXAM: ULTRASOUND ABDOMEN LIMITED RIGHT UPPER QUADRANT COMPARISON:  10/24/2024, 06/25/2022 FINDINGS: Gallbladder: 15 mm shadowing gallstone is identified. No evidence of gallbladder wall thickening or pericholecystic fluid to suggest cholecystitis. Negative sonographic Murphy sign. 1.4 cm hyperechoic region arising from the ventral aspect of the gallbladder fundus consistent with adenomyomatosis as evaluated on previous MRI. Common bile duct: Diameter: 2 mm Liver: Increased liver echotexture consistent with hepatic steatosis. Portal vein is patent on color Doppler imaging with normal direction of blood flow towards the liver. Other: None. IMPRESSION: 1. Cholelithiasis without evidence of acute cholecystitis. 2. Focal adenomyomatosis of the gallbladder fundus, unchanged since numerous prior exams. 3. Increased liver echotexture consistent with hepatic steatosis. Electronically Signed   By: Ozell Daring M.D.   On: 11/15/2024 21:18       Assessment / Plan:    #62 59 year old African-American female with multiple serious medical problems just discharged from [facility] 3 days ago after 2-week hospital stay there only to return to the ER within 24 hours of discharge here with primary complaint of profound weakness This is in the setting of severe cardiomyopathy with EF of 20%,, end-stage renal disease on [place] dialysis, HIV,  diabetes mellitus, sarcoidosis, pituitary apoplexy and adrenal insufficiency.  She had been on higher dose of steroid while at [facility] and then tapered prior to discharge-query if adrenal insufficiency accounting for her severe exhaustion feeling.  #2 elevated LFTs with cholestatic pattern with abrupt jump in alkaline phosphatase over the past few  days.  She had not had LFTs checked after admission to [facility].  She did get a course of Keflex  for peritoneal dialysis catheter site infection while at [facility]. Ultrasound pertinent only for hepatic steatosis, CBD 2 mm No concern for CBD obstruction CMV DNA quant negative INR normal  Suspect that this is drug-induced cholestasis  #2 anemia chronic #3.  Nutrition, currently has Dobbhoff in place and on tube feedings, purging her to try to eat #4 chronic anorexia #5 mild esophageal dysmotility, recent EGD here unremarkable but empirically dilated, she does not have severe dysphagia and that is not the cause of her poor intake-[facility] was planning for outpatient manometry  #6 severe heart failure with EF of 20%-decline in EF over the past 1 month  Plan; INR is normal, arguing against any significant liver injury. Drug-induced cholestasis can take several weeks to resolve, she probably does not need to have LFTs checked on a daily basis, they want to continue to check once or twice weekly  GI will be available peripherally but will not see daily.   Principal Problem:   Hyperkalemia Active Problems:   HIV (human immunodeficiency virus infection) (HCC)   Depression   Hypertension   Asthma   Type 2 diabetes mellitus with hyperlipidemia (HCC)   GAD (generalized anxiety disorder)   Hyperlipidemia   ESRD (end stage renal disease) (HCC)   Normocytic anemia   Chronic gout   History of aortic dissection   Hypothyroidism   H/O aortic aneurysm repair   Benign neoplasm of pituitary gland (HCC)   Abnormal esophagram   Hypoglycemia   HFrEF (heart failure with reduced ejection fraction) (HCC)   Pituitary apoplexy   History of breast cancer   LFT elevation   Cholestasis (HCC)   Elevated alkaline phosphatase level     LOS: 1 day   Amy EsterwoodPA-C  11/17/2024, 11:00 AM      Attending Physician's Attestation   I have taken an interval history, reviewed the chart and examined the patient.    Cholestasis remains though transferase's are downtrending slightly.  Alkaline phosphatase isoenzyme fractionation sent as miscellaneous [facility] is pending at this time.  GGT still remains elevated.  If this is Dili this may take weeks to improve.  Recommend continuing to monitor but if things continue to stagnate, an extended cholestatic hepatitis workup could be considered.  Would hold on liver biopsy at this time.  Of the liver biochemical's testing is pending or will be completed tomorrow.  The [facility] service will periodically see LFT, likely that we will not be recommending much further at this time.  I agree with the Advanced Practitioner's note, impression, and recommendations with updates and my documentation as noted above.  The majority of the medical decision making/process, formulation of the impression/plan of action for the patient were performed by me with substantive portion of this encounter (>50% time spent including complete performance of at least one of the key components of MDM, History, and/or Exam).   Aloha Finner, MD [facility] Office # [phone]

## 2024-11-17 NOTE — Progress Notes (Addendum)
 [name] is an 59 y.o. 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  Started on tube feeds, phos binders were held, blood cultures all negative.  GI consulted and plan for outpatient manometry.  Patient also with questionable exit site infection of the PD and completed 10-day course of Keflex  with no signs of infection in the peritoneal fluid.  P/W weakness and fatigue with blood pressures that were on the lower side in the emergency department with a BUN/creatinine 103/9.69 elevated alkaline phosphatase, potassium 5.8 treated medically in the ED.    PD orders 5 cycles, 2.25 L fill volume, 2.5% Dianeal , 1hr 30 min dwell  Assessment/Plan: ESRD -on peritoneal dialysis and will restart him on therapy tonight; workup was negative for infection at [facility] and patient completed a 10-day course with oral antibiotics for questionable exercise infection. - I had the staff educate about removing the connector; she did not know there is an adapter that can be removed to allow her to hook up to her own machine at home.  She had neg 77mL with the PD exchanges 1st night, doesn't appear to be overloaded. Will continue alternating 2.5/1.5 again tonight starting with the 2.5%.   Seen on PD net neg 341 ml/overnight cycler.  -Monitor Daily I/Os, Daily weight  -Maintain MAP>65 for optimal renal perfusion.  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  Tolerated PD overnight   Chemistry and CBC: Creatinine  Date/Time Value Ref Range Status  07/27/2021 11:10 AM 2.72 (H) 0.44 - 1.00 mg/dL Final  89/73/7979 97:52 PM 1.98 (H) 0.44 - 1.00 mg/dL Final  92/74/7980 97:41 PM 1.28 (H) 0.44 - 1.00 mg/dL Final   Creat  Date/Time Value Ref Range Status  03/17/2021 10:50 AM 2.74 (H) 0.50 - 1.05 mg/dL Final    Comment:    For patients >45 years of age, the reference limit for Creatinine is approximately 13% higher for people identified as African-American. .   09/23/2015 10:47 AM 1.02 0.50 - 1.10 mg/dL Final  94/68/7983 90:85 AM 0.92 0.50 - 1.10 mg/dL Final  87/70/7984 92:70 PM 1.02 0.50 - 1.10 mg/dL Final  98/92/7984 97:58 PM 0.88 0.50 - 1.10 mg/dL Final  93/81/7985 96:46 PM 0.82 0.50 - 1.10 mg/dL Final  96/72/7985 89:59 AM 0.93 0.50 - 1.10 mg/dL Final  95/74/7986 96:54 PM 0.75 0.50 - 1.10 mg/dL Final  89/91/7987 88:98 AM 0.80 0.50 - 1.10 mg/dL Final    Comment:     Please note change in reference range(s).  Creatinine, Ser  Date/Time Value Ref Range Status  11/16/2024 02:58 AM 9.10 (H) 0.44 - 1.00 mg/dL Final  98/69/7973 87:74 PM 9.69 (H) 0.44 - 1.00 mg/dL Final  87/77/7974 94:66 AM 11.10 (H) 0.44 - 1.00 mg/dL Final  87/80/7974 93:93 AM 11.70 (H) 0.44 - 1.00 mg/dL Final  87/81/7974 94:92 AM 11.70 (H) 0.44 - 1.00 mg/dL Final  87/83/7974  95:71 AM 12.31 (H) 0.44 - 1.00 mg/dL Final  87/84/7974 95:97 AM 12.56 (H) 0.44 - 1.00 mg/dL Final  87/86/7974 94:99 AM 11.90 (H) 0.44 - 1.00 mg/dL Final  87/87/7974 96:43 AM 11.00 (H) 0.44 - 1.00 mg/dL Final  87/88/7974 94:67 AM 10.62 (H) 0.44 - 1.00 mg/dL Final  87/89/7974 94:62 AM 11.08 (H) 0.44 - 1.00 mg/dL Final  87/90/7974 96:45 AM 11.85 (H) 0.44 - 1.00 mg/dL Final  87/91/7974 97:68 PM 14.09 (H) 0.44 - 1.00 mg/dL Final  87/91/7974 98:47 PM 14.30 (H) 0.44 - 1.00 mg/dL Final  88/74/7974 97:53 AM 14.70 (H) 0.44 - 1.00 mg/dL Final  88/75/7974 96:41 AM 14.73 (H) 0.44 - 1.00 mg/dL Final  88/76/7974 94:72 AM 15.88 (H) 0.44 - 1.00 mg/dL Final  88/77/7974 92:77 PM 15.35 (H) 0.44 - 1.00 mg/dL Final  89/82/7974 94:74 AM 13.77 (H) 0.44 - 1.00 mg/dL Final  89/83/7974 97:70 AM 14.10 (H) 0.44 - 1.00 mg/dL Final  89/85/7974 91:80 PM 13.60 (H) 0.44 - 1.00 mg/dL Final  89/85/7974 91:88 PM 13.88 (H) 0.44 - 1.00 mg/dL Final  89/97/7974 94:65 PM 13.70 (H) 0.44 - 1.00 mg/dL Final  89/97/7974 95:58 PM 14.20 (H) 0.44 - 1.00 mg/dL Final  93/95/7974 94:82 PM 12.70 (H) 0.44 - 1.00 mg/dL Final  96/73/7974 90:91 AM 6.00 (H) 0.44 - 1.00 mg/dL Final  96/93/7974 88:60 AM 6.40 (H) 0.44 - 1.00 mg/dL Final  88/70/7975 93:88 PM 3.86 (H) 0.44 - 1.00 mg/dL Final  88/84/7975 90:98 AM 6.67 (H) 0.44 - 1.00 mg/dL Final  88/87/7975 90:53 AM 7.34 (H) 0.44 - 1.00 mg/dL Final  88/88/7975 97:96 AM 5.56 (H) 0.44 - 1.00 mg/dL Final  88/89/7975 89:99 AM 4.03 (H) 0.44 - 1.00 mg/dL Final  88/89/7975 97:50 AM 6.81 (H) 0.44 - 1.00 mg/dL Final  88/90/7975 89:92 PM 6.47 (H) 0.44 - 1.00 mg/dL Final  88/90/7975 94:82 PM 5.93 (H) 0.44 - 1.00 mg/dL Final  88/90/7975 88:65 AM 5.52 (H) 0.44 - 1.00 mg/dL Final  88/90/7975 94:91 AM 5.15 (H) 0.44 - 1.00 mg/dL Final  88/91/7975 88:74 PM 4.76 (H) 0.44 - 1.00 mg/dL Final  88/91/7975 94:40 AM 4.40 (H) 0.44 - 1.00 mg/dL Final  88/93/7975 88:86 AM 4.46 (H) 0.44 - 1.00 mg/dL Final  90/75/7975  97:49 PM 4.54 (H) 0.44 - 1.00 mg/dL Final  92/81/7975 98:67 PM 4.20 (H) 0.44 - 1.00 mg/dL Final  94/76/7975 96:55 PM 4.85 (H) 0.44 - 1.00 mg/dL Final  94/93/7975 94:91 AM 8.07 (H) 0.44 - 1.00 mg/dL Final  94/95/7975 98:63 AM 4.82 (H) 0.44 - 1.00 mg/dL Final  94/96/7975 94:84 AM 6.30 (H) 0.44 - 1.00 mg/dL Final  94/97/7975 96:54 AM 4.39 (H) 0.44 - 1.00 mg/dL Final  94/98/7975 96:84 PM 7.23 (H) 0.44 - 1.00 mg/dL Final  94/98/7975 94:59 AM 6.49 (H) 0.44 - 1.00 mg/dL Final  95/69/7975 93:64 AM 5.14 (H) 0.44 - 1.00 mg/dL Final  95/70/7975 95:90 AM 8.44 (H) 0.44 - 1.00 mg/dL Final  95/71/7975 95:83 AM 7.20 (H) 0.44 - 1.00 mg/dL Final   Recent Labs  Lab 11/15/24 1225 11/16/24 0258 11/17/24 0155  NA 131* 130* 124*  K 5.8* 4.5 4.8  CL 88* 87* 82*  CO2 23 24 22   GLUCOSE 49* 91 218*  BUN 103* 96* 92*  CREATININE 9.69* 9.10* 8.30*  CALCIUM  8.9 8.7* 8.6*  PHOS  --  3.8  --    Recent Labs  Lab 11/15/24 1225 11/16/24 0258 11/17/24 0155  WBC 13.1* 13.5* 17.7*  NEUTROABS 9.8*  --   --   HGB 9.3* 9.2* 8.6*  HCT 26.9* 25.7* 24.1*  MCV 101.9* 99.6 98.4  PLT 142* 138* 151   Liver Function Tests: Recent Labs  Lab 11/15/24 1225 11/16/24 0258 11/17/24 0155  AST 103* 86* 82*  ALT 78* 65* 64*  ALKPHOS 1,218* 1,114* 1,196*  BILITOT 0.8 0.7 0.6  PROT 6.3* 5.9* 6.6  ALBUMIN  2.3* 2.0* 2.3*   Recent Labs  Lab 11/15/24 1225  LIPASE 21   No results for input(s): AMMONIA in the last 168 hours. Cardiac Enzymes: No results for input(s): CKTOTAL, CKMB, CKMBINDEX, TROPONINI in the last 168 hours. Iron  Studies: No results for input(s): IRON , TIBC, TRANSFERRIN, FERRITIN in the last 72 hours. PT/INR: @LABRCNTIP (inr:5)  Xrays/Other Studies: ) Results for orders placed or performed during the hospital encounter of 11/15/24 (from the past 48 hours)  CBG monitoring, ED     Status: Abnormal   Collection Time: 11/15/24 12:15 PM  Result Value Ref Range   Glucose-Capillary 41  (LL) 70 - 99 mg/dL    Comment: Glucose reference range applies only to samples taken after fasting for at least 8 hours.   Comment 1 Notify RN   Comprehensive metabolic panel     Status: Abnormal   Collection Time: 11/15/24 12:25 PM  Result Value Ref Range   Sodium 131 (L) 135 - 145 mmol/L   Potassium 5.8 (H) 3.5 - 5.1 mmol/L   Chloride 88 (L) 98 - 111 mmol/L   CO2 23 22 - 32 mmol/L   Glucose, Bld 49 (L) 70 - 99 mg/dL    Comment: Glucose reference range applies only to samples taken after fasting for at least 8 hours.   BUN 103 (H) 6 - 20 mg/dL   Creatinine, Ser 0.30 (H) 0.44 - 1.00 mg/dL   Calcium  8.9 8.9 - 10.3 mg/dL   Total Protein 6.3 (L) 6.5 - 8.1 g/dL   Albumin  2.3 (L) 3.5 - 5.0 g/dL   AST 896 (H) 15 - 41 U/L   ALT 78 (H) 0 - 44 U/L   Alkaline Phosphatase 1,218 (H) 38 - 126 U/L   Total Bilirubin 0.8 0.0 - 1.2 mg/dL   GFR, Estimated 4 (L) >60 mL/min    Comment: (NOTE) Calculated using the CKD-EPI Creatinine Equation (2021)    Anion gap 20 (H) 5 - 15    Comment: Performed at [facility], [address], [place], [place] [address]  Lipase, blood     Status: None   Collection Time: 11/15/24 12:25 PM  Result Value Ref Range   Lipase 21 11 - 51 U/L    Comment: Performed at [facility], [address] [place], [place] [address]  CBC with Differential     Status: Abnormal   Collection Time: 11/15/24 12:25 PM  Result Value Ref Range   WBC 13.1 (H) 4.0 - 10.5 K/uL   RBC 2.64 (L) 3.87 - 5.11 MIL/uL   Hemoglobin 9.3 (L) 12.0 - 15.0 g/dL   HCT 73.0 (L) 63.9 - 53.9 %   MCV 101.9 (H) 80.0 - 100.0 fL   MCH  35.2 (H) 26.0 - 34.0 pg   MCHC 34.6 30.0 - 36.0 g/dL   RDW 83.5 (H) 88.4 - 84.4 %   Platelets 142 (L) 150 - 400 K/uL   nRBC 4.7 (H) 0.0 - 0.2 %   Neutrophils Relative % 74 %   Neutro Abs 9.8 (H) 1.7 - 7.7 K/uL   Lymphocytes Relative 12 %   Lymphs Abs 1.5 0.7 - 4.0 K/uL   Monocytes Relative 7 %   Monocytes Absolute 0.9 0.1 - 1.0 K/uL   Eosinophils Relative 1 %    Eosinophils Absolute 0.1 0.0 - 0.5 K/uL   Basophils Relative 0 %   Basophils Absolute 0.0 0.0 - 0.1 K/uL   WBC Morphology MORPHOLOGY UNREMARKABLE    RBC Morphology See Note    Smear Review Normal platelet morphology    Immature Granulocytes 6 %   Abs Immature Granulocytes 0.84 (H) 0.00 - 0.07 K/uL   Polychromasia PRESENT    Target Cells PRESENT     Comment: Performed at [facility], [address], [place], [place] [address]  Magnesium      Status: None   Collection Time: 11/15/24 12:25 PM  Result Value Ref Range   Magnesium  2.1 1.7 - 2.4 mg/dL    Comment: Performed at [facility], [address], [place], [place] [address]  POC CBG, ED     Status: None   Collection Time: 11/15/24 12:57 PM  Result Value Ref Range   Glucose-Capillary 88 70 - 99 mg/dL    Comment: Glucose reference range applies only to samples taken after fasting for at least 8 hours.  C Difficile Quick Screen w PCR reflex     Status: None   Collection Time: 11/15/24  1:03 PM   Specimen: STOOL  Result Value Ref Range   C Diff antigen NEGATIVE NEGATIVE   C Diff toxin NEGATIVE NEGATIVE   C Diff interpretation No C. difficile detected.     Comment: Performed at [facility], [address], [place], [place] [address]  POC CBG, ED     Status: Abnormal   Collection Time: 11/15/24  2:51 PM  Result Value Ref Range   Glucose-Capillary 44 (LL) 70 - 99 mg/dL    Comment: Glucose reference range applies only to samples taken after fasting for at least 8 hours.   Comment 1 Notify RN   POC CBG, ED     Status: None   Collection Time: 11/15/24  3:49 PM  Result Value Ref Range   Glucose-Capillary 98 70 - 99 mg/dL    Comment: Glucose reference range applies only to samples taken after fasting for at least 8 hours.  Gamma GT     Status: Abnormal   Collection Time: 11/15/24  6:29 PM  Result Value Ref Range   GGT 1,582 (H) 7 - 50 U/L    Comment: Results confirmed by automated dilution  Performed at [facility], [address], [place], [place] [address]   CMV DNA, quantitative, PCR     Status: None   Collection Time: 11/15/24  6:29 PM  Result Value Ref Range   CMV DNA Quant Negative Negative IU/mL    Comment: (NOTE) No CMV DNA detected. The quantitative range of this assay is 200 to 1 million IU/mL. Performed At: [facility] [address] [place], [place] [id number] Jennette Shorter MD Ey:[id number]    Log10 CMV Qn DNA Pl UNABLE TO CALCULATE log10 IU/mL  Comment: (NOTE) Unable to calculate result since non-numeric result obtained for component test.   Glucose, capillary     Status: None   Collection Time: 11/15/24  8:42 PM  Result Value Ref Range   Glucose-Capillary 73 70 - 99 mg/dL    Comment: Glucose reference range applies only to samples taken after fasting for at least 8 hours.  Glucose, capillary     Status: None   Collection Time: 11/16/24 12:12 AM  Result Value Ref Range   Glucose-Capillary 86 70 - 99 mg/dL    Comment: Glucose reference range applies only to samples taken after fasting for at least 8 hours.  Comprehensive metabolic panel     Status: Abnormal   Collection Time: 11/16/24  2:58 AM  Result Value Ref Range   Sodium 130 (L) 135 - 145 mmol/L   Potassium 4.5 3.5 - 5.1 mmol/L   Chloride 87 (L) 98 - 111 mmol/L   CO2 24 22 - 32 mmol/L   Glucose, Bld 91 70 - 99 mg/dL    Comment: Glucose reference range applies only to samples taken after fasting for at least 8 hours.   BUN 96 (H) 6 - 20 mg/dL   Creatinine, Ser 0.89 (H) 0.44 - 1.00 mg/dL   Calcium  8.7 (L) 8.9 - 10.3 mg/dL   Total Protein 5.9 (L) 6.5 - 8.1 g/dL   Albumin  2.0 (L) 3.5 - 5.0 g/dL   AST 86 (H) 15 - 41 U/L   ALT 65 (H) 0 - 44 U/L   Alkaline Phosphatase 1,114 (H) 38 - 126 U/L   Total Bilirubin 0.7 0.0 - 1.2 mg/dL   GFR, Estimated 5 (L) >60 mL/min    Comment: (NOTE) Calculated using the CKD-EPI Creatinine Equation (2021)    Anion gap 19 (H) 5 - 15    Comment: Performed at [facility], [address], [place], [place] [address]  CBC     Status: Abnormal   Collection Time: 11/16/24  2:58 AM  Result Value Ref Range   WBC 13.5 (H) 4.0 - 10.5 K/uL   RBC 2.58 (L) 3.87 - 5.11 MIL/uL   Hemoglobin 9.2 (L) 12.0 - 15.0 g/dL   HCT 74.2 (L) 63.9 - 53.9 %   MCV 99.6 80.0 - 100.0 fL   MCH 35.7 (H) 26.0 - 34.0 pg   MCHC 35.8 30.0 - 36.0 g/dL   RDW 83.5 (H) 88.4 - 84.4 %   Platelets 138 (L) 150 - 400 K/uL   nRBC 4.8 (H) 0.0 - 0.2 %    Comment: Performed at [facility], [address], [place], [place] [address]  Phosphorus     Status: None   Collection Time: 11/16/24  2:58 AM  Result Value Ref Range   Phosphorus 3.8 2.5 - 4.6 mg/dL    Comment: Performed at [facility], [address] [place], [place] [address]  Magnesium      Status: None   Collection Time: 11/16/24  2:58 AM  Result Value Ref Range   Magnesium  2.0 1.7 - 2.4 mg/dL    Comment: Performed at [facility], [address] [place], [place] [address]  Glucose, capillary     Status: None   Collection Time: 11/16/24  3:51 AM  Result Value Ref Range   Glucose-Capillary 92 70 - 99 mg/dL    Comment: Glucose reference range applies only to samples taken after fasting for at least 8 hours.  Glucose, capillary  Status: None   Collection Time: 11/16/24  8:22 AM  Result Value Ref Range   Glucose-Capillary 96 70 - 99 mg/dL    Comment: Glucose reference range applies only to samples taken after fasting for at least 8 hours.   Comment 1 Notify RN    Comment 2 Document in Chart   Glucose, capillary     Status: None   Collection Time: 11/16/24 11:15 AM  Result Value Ref Range   Glucose-Capillary 92 70 - 99 mg/dL    Comment: Glucose reference range applies only to samples taken after fasting for at least 8 hours.   Comment 1 Notify RN    Comment 2 Document in Chart   Glucose, capillary     Status: None   Collection Time: 11/16/24  4:00 PM  Result Value Ref Range   Glucose-Capillary 77  70 - 99 mg/dL    Comment: Glucose reference range applies only to samples taken after fasting for at least 8 hours.   Comment 1 Notify RN    Comment 2 Document in Chart   Glucose, capillary     Status: None   Collection Time: 11/16/24  6:04 PM  Result Value Ref Range   Glucose-Capillary 91 70 - 99 mg/dL    Comment: Glucose reference range applies only to samples taken after fasting for at least 8 hours.  Gamma GT     Status: Abnormal   Collection Time: 11/16/24  7:51 PM  Result Value Ref Range   GGT 1,295 (H) 7 - 50 U/L    Comment: Performed at [facility], [address] [place], [place] [address]  Glucose, capillary     Status: Abnormal   Collection Time: 11/16/24  8:45 PM  Result Value Ref Range   Glucose-Capillary 115 (H) 70 - 99 mg/dL    Comment: Glucose reference range applies only to samples taken after fasting for at least 8 hours.  Glucose, capillary     Status: Abnormal   Collection Time: 11/17/24  1:26 AM  Result Value Ref Range   Glucose-Capillary 228 (H) 70 - 99 mg/dL    Comment: Glucose reference range applies only to samples taken after fasting for at least 8 hours.  CBC     Status: Abnormal   Collection Time: 11/17/24  1:55 AM  Result Value Ref Range   WBC 17.7 (H) 4.0 - 10.5 K/uL   RBC 2.45 (L) 3.87 - 5.11 MIL/uL   Hemoglobin 8.6 (L) 12.0 - 15.0 g/dL   HCT 75.8 (L) 63.9 - 53.9 %   MCV 98.4 80.0 - 100.0 fL   MCH 35.1 (H) 26.0 - 34.0 pg   MCHC 35.7 30.0 - 36.0 g/dL   RDW 83.6 (H) 88.4 - 84.4 %   Platelets 151 150 - 400 K/uL   nRBC 1.9 (H) 0.0 - 0.2 %    Comment: Performed at [facility], [address] [place], [place] [address]  Comprehensive metabolic panel     Status: Abnormal   Collection Time: 11/17/24  1:55 AM  Result Value Ref Range   Sodium 124 (L) 135 - 145 mmol/L   Potassium 4.8 3.5 - 5.1 mmol/L   Chloride 82 (L) 98 - 111 mmol/L   CO2 22 22 - 32 mmol/L   Glucose, Bld 218 (H) 70 - 99 mg/dL    Comment: Glucose reference range  applies only to samples taken after fasting for at least 8 hours.   BUN 92 (H) 6 -  20 mg/dL   Creatinine, Ser 1.69 (H) 0.44 - 1.00 mg/dL   Calcium  8.6 (L) 8.9 - 10.3 mg/dL   Total Protein 6.6 6.5 - 8.1 g/dL   Albumin  2.3 (L) 3.5 - 5.0 g/dL   AST 82 (H) 15 - 41 U/L   ALT 64 (H) 0 - 44 U/L   Alkaline Phosphatase 1,196 (H) 38 - 126 U/L   Total Bilirubin 0.6 0.0 - 1.2 mg/dL   GFR, Estimated 5 (L) >60 mL/min    Comment: (NOTE) Calculated using the CKD-EPI Creatinine Equation (2021)    Anion gap 19 (H) 5 - 15    Comment: Performed at [facility], [address], [place], [place] [address]  Protime-INR     Status: None   Collection Time: 11/17/24  1:55 AM  Result Value Ref Range   Prothrombin Time 14.6 11.4 - 15.2 seconds   INR 1.1 0.8 - 1.2    Comment: (NOTE) INR goal varies based on device and disease states. Performed at [facility], [address] [place], [place] [address]   Glucose, capillary     Status: Abnormal   Collection Time: 11/17/24  3:06 AM  Result Value Ref Range   Glucose-Capillary 243 (H) 70 - 99 mg/dL    Comment: Glucose reference range applies only to samples taken after fasting for at least 8 hours.  Glucose, capillary     Status: Abnormal   Collection Time: 11/17/24  9:09 AM  Result Value Ref Range   Glucose-Capillary 100 (H) 70 - 99 mg/dL    Comment: Glucose reference range applies only to samples taken after fasting for at least 8 hours.   *Note: Due to a large number of results and/or encounters for the requested time period, some results have not been displayed. A complete set of results can be found in Results Review.   US  Abdomen Limited RUQ (LIVER/GB) Result Date: 11/15/2024 CLINICAL DATA:  Elevated liver function tests EXAM: ULTRASOUND ABDOMEN LIMITED RIGHT UPPER QUADRANT COMPARISON:  10/24/2024, 06/25/2022 FINDINGS: Gallbladder: 15 mm shadowing gallstone is identified. No evidence of gallbladder wall thickening or pericholecystic fluid to  suggest cholecystitis. Negative sonographic Murphy sign. 1.4 cm hyperechoic region arising from the ventral aspect of the gallbladder fundus consistent with adenomyomatosis as evaluated on previous MRI. Common bile duct: Diameter: 2 mm Liver: Increased liver echotexture consistent with hepatic steatosis. Portal vein is patent on color Doppler imaging with normal direction of blood flow towards the liver. Other: None. IMPRESSION: 1. Cholelithiasis without evidence of acute cholecystitis. 2. Focal adenomyomatosis of the gallbladder fundus, unchanged since numerous prior exams. 3. Increased liver echotexture consistent with hepatic steatosis. Electronically Signed   By: Ozell Daring M.D.   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  Genes Analyzed (43 total): APC*, ATM*, AXIN2,  BARD1, BMPR1A, BRCA1*, BRCA2*, BRIP1*, CDH1*, CDK4, CDKN2A, CHEK2*, DICER1, GALNT12, HOXB13, MEN1, MLH1*, MRE11A, MSH2*, MSH3, MSH6*, MUTYH*, NBN, NF1*, NTHL1, PALB2*, PMS2*, POLD1, POLE, PTEN*, RAD50, RAD51C*, RAD51D*, RET, SDHB, SDHD, SMAD4, SMARCA4, STK11 and TP53* (sequencing and deletion/duplication); EGFR (s   GERD 07/24/2006   GRAVE'S DISEASE 01/01/2008   History of hidradenitis suppurativa    History of kidney stones    History of thrush    HIV DISEASE 07/24/2006   dx March 05   Hyperlipidemia    HYPERTENSION 07/24/2006   Hyperthyroidism 08/2006   Grave's Disease -diffuse radiotracer uptake 08/25/06 Thyroid  scan-Cold nodule to R lower lobe of thyrorid   Ileus (HCC) 02/14/2023   Left bundle branch block (LBBB)    Malnutrition of moderate degree 02/08/2023   Menometrorrhagia    hx of   Nephrolithiasis    Panniculitis 05/12/2020   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 Completed 2 yrs Prednisone  after lung bx confirmation. No symptoms since then.   Sebaceous cyst 04/21/2020   Shock (HCC) 09/23/2024   Suppurative hidradenitis    Thyroid  cancer (HCC)    THYROID  NODULE, RIGHT 02/08/2007    PSH:   Past Surgical History:  Procedure Laterality Date   AORTIC ARCH DEBRANCHING N/A 08/25/2023   Procedure: AORTIC ARCH DEBRANCHING USING 12X8X8MM HEMASHIELD GRAFT;  Surgeon: Lucas Dorise POUR, MD;  Location: [facility] OR;  Service: Open Heart Surgery;  Laterality: N/A;  with bypasses to the innominate and left common carotid arteries   APPLICATION OF WOUND VAC N/A 01/20/2021   Procedure: APPLICATION OF WOUND VAC;  Surgeon: Lowery Estefana RAMAN, DO;  Location: [facility];  Service: Plastics;  Laterality: N/A;   BREAST EXCISIONAL BIOPSY Right 04/26/2018   right axilla negative   BREAST EXCISIONAL BIOPSY Left 04/26/2018   left axilla negative   BREAST LUMPECTOMY Right 10/03/2018   malignant   BREAST LUMPECTOMY WITH RADIOACTIVE SEED AND SENTINEL LYMPH NODE BIOPSY Right 10/03/2018   Procedure: RIGHT BREAST LUMPECTOMY WITH RADIOACTIVE SEED AND SENTINEL LYMPH NODE MAPPING;  Surgeon: Vanderbilt Ned, MD;  [facility]: [facility];  Service: General;  Laterality: Right;   BREAST SURGERY  1997   Breast Reduction    CAPD INSERTION N/A 12/21/2023   Procedure: LAPAROSCOPIC INSERTION CONTINUOUS AMBULATORY PERITONEAL DIALYSIS  (CAPD) CATHETER; LAPAROSCOPIC OMENTUMPEXY;  Surgeon: Magda Ned SAILOR, MD;  Location: [facility];  Service: Vascular;  Laterality: N/A;   CYSTOSCOPY W/ URETERAL STENT REMOVAL  11/09/2012   Procedure: CYSTOSCOPY WITH STENT REMOVAL;  Surgeon: Ricardo Likens, MD;  Location: [facility];  Service: Urology;  Laterality: Right;   CYSTOSCOPY WITH RETROGRADE PYELOGRAM, URETEROSCOPY AND STENT PLACEMENT  11/09/2012   Procedure: CYSTOSCOPY WITH RETROGRADE PYELOGRAM, URETEROSCOPY AND STENT PLACEMENT;  Surgeon: Ricardo Likens, MD;  Location: [facility];  Service: Urology;  Laterality: Left;  LEFT URETEROSCOPY, STONE MANIPULATION, left STENT exchange    CYSTOSCOPY WITH STENT PLACEMENT  10/02/2012   Procedure: CYSTOSCOPY WITH STENT PLACEMENT;  Surgeon: Ricardo Likens, MD;  [facility]: [facility];  Service: Urology;  Laterality: Left;   DEBRIDEMENT AND CLOSURE WOUND N/A 01/20/2021   Procedure: Excision of abdominal wound with closure;  Surgeon: Lowery Estefana RAMAN, DO;  Location: [facility];  Service: Plastics;  Laterality: N/A;   DILATION AND CURETTAGE OF UTERUS  11/2002   s/p for 1st trimester nonviable pregnancy   ESOPHAGOGASTRODUODENOSCOPY N/A 09/11/2024   Procedure: EGD (ESOPHAGOGASTRODUODENOSCOPY);  Surgeon: Abran Norleen SAILOR, MD;  Location: [facility] ENDOSCOPY;  Service: Gastroenterology;  Laterality: N/A;   EYE SURGERY     sty under eyelid   INCISE AND DRAIN ABCESS  08/2002   s/p I &D for righ inframmary fold hidradenitis   INCISION AND DRAINAGE PERITONSILLAR ABSCESS  12/2001   IR CV LINE INJECTION  06/07/2018   IR FLUORO GUIDE CV LINE RIGHT  01/30/2023   IR IMAGING GUIDED PORT INSERTION  06/20/2018   IR REMOVAL TUN ACCESS W/ PORT W/O FL MOD SED  06/20/2018   IR US  GUIDE VASC ACCESS RIGHT  01/30/2023   IRRIGATION AND DEBRIDEMENT ABSCESS  01/31/2012   Procedure: IRRIGATION AND DEBRIDEMENT ABSCESS;  Surgeon: Alm VEAR Angle, MD;  [facility];  Service: General;  Laterality: Right;  right breast and axilla    LAPAROSCOPIC REPOSITIONING CAPD CATHETER Left 01/10/2024   Procedure: Removal of peritoneal dialysis Catheter.;  Surgeon: Magda Debby SAILOR, MD;  Location: [facility] OR;  Service: Vascular;  Laterality: Left;   LAPAROSCOPY N/A 01/10/2024   Procedure: LAPAROSCOPY, DIAGNOSTIC;  Surgeon: Magda Debby SAILOR, MD;  Location: [facility] OR;  Service: Vascular;  Laterality: N/A;   NEPHROLITHOTOMY  10/02/2012   Procedure: NEPHROLITHOTOMY PERCUTANEOUS;  Surgeon: Ricardo Likens, MD;  [facility];  Service: Urology;  Laterality: Right;  First Stage Percutaneous Nephrolithotomy with Surgeon Access, Left Ureteral Stent     NEPHROLITHOTOMY  10/04/2012   Procedure: NEPHROLITHOTOMY PERCUTANEOUS SECOND LOOK;  Surgeon: Ricardo Likens, MD;  [facility];  Service: Urology;  Laterality: Right;      NEPHROLITHOTOMY  10/08/2012   Procedure: NEPHROLITHOTOMY PERCUTANEOUS;  Surgeon: Ricardo Likens, MD;  [facility];  Service: Urology;  Laterality: Right;  THIRD STAGE, nephrostomy tube exchange x 2    NEPHROLITHOTOMY  10/11/2012   Procedure: NEPHROLITHOTOMY PERCUTANEOUS SECOND LOOK;  Surgeon: Ricardo Likens, MD;  [facility]: [facility];  Service: Urology;  Laterality: Right;  RIGHT 4 STAGE PERCUTANOUS NEPHROLITHOTOMY, right URETEROSCOPY WITH HOLMIUM LASER    PANNICULECTOMY N/A 12/21/2020   Procedure: PANNICULECTOMY;  Surgeon: Lowery Estefana RAMAN, DO;  [facility];  Service: Plastics;  Laterality: N/A;   PERCUTANEOUS NEPHROSTOLITHOTOMY  04/2022   PORT-A-CATH REMOVAL N/A 07/16/2020   Procedure: REMOVAL PORT-A-CATH;  Surgeon: Vanderbilt Debby, MD;  Location: [facility];  Service: General;  Laterality: N/A;   PORTACATH PLACEMENT Left 05/17/2018   Procedure: INSERTION PORT-A-CATH;  Surgeon: Vernetta Berg, MD;  Location: [facility];  Service: General;  Laterality: Left;   RADICAL NECK DISSECTION  04/12/2017   limited/notes 04/12/2017   RADICAL NECK DISSECTION N/A 04/12/2017   Procedure: RADICAL NECK DISSECTION;  Surgeon: Carlie Clark, MD;  Location: [facility] OR;  Service: ENT;  Laterality: N/A;  limited neck dissection 2 hours total   REDUCTION MAMMAPLASTY Bilateral 1998   RIGHT/LEFT HEART CATH AND CORONARY ANGIOGRAPHY N/A 03/12/2020   Procedure: RIGHT/LEFT HEART CATH AND CORONARY ANGIOGRAPHY;  Surgeon: Cherrie Toribio SAUNDERS, MD;  [facility];  Service: Cardiovascular;  Laterality: N/A;   RIGHT/LEFT HEART CATH AND CORONARY ANGIOGRAPHY N/A 09/26/2024   Procedure: RIGHT/LEFT HEART CATH AND CORONARY ANGIOGRAPHY;  Surgeon: Darron Deatrice LABOR, MD;  [facility];  Service: Cardiovascular;  Laterality: N/A;   [place]  1994   TEE WITHOUT CARDIOVERSION N/A 08/25/2023   Procedure: TRANSESOPHAGEAL ECHOCARDIOGRAM;  Surgeon: Lucas Dorise POUR, MD;  [facility];  Service: Open Heart Surgery;  Laterality: N/A;   TENCKHOFF CATHETER INSERTION Left 01/10/2024   Procedure: INSERTION, CATHETER, DIALYSIS, PERITONEAL;  Surgeon: Magda Debby SAILOR, MD;  [facility];  Service: Vascular;  Laterality: Left;   THORACIC AORTIC ENDOVASCULAR STENT GRAFT N/A 02/02/2023   Procedure: THORACIC AORTIC ENDOVASCULAR STENT GRAFT;  Surgeon: Lanis Fonda BRAVO, MD;  Location: [facility] OR;  Service: Vascular;  Laterality: N/A;   THORACIC AORTIC ENDOVASCULAR STENT GRAFT N/A 08/25/2023   Procedure: THORACIC AORTIC ENDOVASCULAR STENT GRAFT;  Surgeon: Lanis Fonda BRAVO, MD;  Location: [facility] OR;  Service: Vascular;  Laterality: N/A;   THYROIDECTOMY  04/12/2017   completion/notes 04/12/2017   THYROIDECTOMY N/A 04/12/2017   Procedure: THYROIDECTOMY;  Surgeon: Carlie Clark, MD;  Location[facility];  Service: ENT;  Laterality: N/A;  Completion Thyroidectomy   TOTAL THYROIDECTOMY  2010   ULTRASOUND GUIDANCE FOR VASCULAR ACCESS Right 08/25/2023   Procedure: ULTRASOUND GUIDANCE FOR VASCULAR ACCESS, RIGHT FEMORAL ARTERY;  Surgeon: Lanis Fonda BRAVO, MD;  Location: [facility] OR;  Service: Vascular;  Laterality: Right;    Allergies: Allergies[1]  Medications:   Prior to Admission medications  Medication Sig Start Date End Date Taking? Authorizing Provider  acetaminophen  (TYLENOL ) 325 MG tablet Take 1-2 tablets (325-650 mg total) by mouth every 4 (four) hours as needed for mild pain (pain score 1-3). 10/07/24  Yes Jerilynn Daphne SAILOR, NP  albuterol  (VENTOLIN  HFA) 108 (90 Base) MCG/ACT inhaler Inhale 2 puffs into the lungs every 6 (six) hours as needed for wheezing or shortness of breath.   Yes [provider]  allopurinol  (ZYLOPRIM ) 100 MG tablet TAKE 1 TABLET (100 MG TOTAL) BY MOUTH DAILY. FOR GOUT PREVENTION 04/11/24  Yes Clark, Katherine K, NP  aspirin  81 MG chewable tablet Chew 1 tablet (81 mg total) by mouth daily. 10/07/24  Yes Jerilynn Daphne SAILOR, NP  busPIRone  (BUSPAR ) 5 MG tablet Take 2 tablets (10 mg total) by mouth 2 (two) times daily. 10/07/24  Yes Jerilynn Daphne SAILOR, NP  calcitRIOL  (ROCALTROL ) 0.25 MCG capsule Take 0.25 mcg by mouth daily. 08/07/24  Yes [provider]   dolutegravir  ([name] ) 50 MG tablet Take 1 tablet (50 mg total) by mouth daily. 05/13/24  Yes Melvenia Corean SAILOR, NP  emtricitabine -tenofovir  AF (DESCOVY ) 200-25 MG tablet Take 1 tablet by mouth daily. 05/13/24  Yes Melvenia Corean SAILOR, NP  hydrocortisone  (CORTEF ) 5 MG tablet Take 3 tablets (15 mg total) by mouth 2 (two) times daily. Patient taking differently: Take 5-15 mg by mouth See admin instructions. Taking 3 tablets (15 mg)  by mouth when PT wakes up in the AM and 1 tablet (5 mg) at 3 pm. 10/07/24  Yes Jerilynn Daphne SAILOR, NP  levocetirizine (XYZAL ) 5 MG tablet TAKE 1 TABLET BY MOUTH EVERY DAY IN THE EVENING FOR ALLERGIES Patient taking differently: Take 5 mg by mouth every evening. 05/12/24  Yes Clark, Katherine K, NP  levothyroxine  (SYNTHROID ) 125 MCG tablet Take 125 mcg by mouth daily.   Yes [provider]  loratadine  (CLARITIN ) 10 MG tablet Take 1 tablet by mouth daily. 09/27/24  Yes [provider]  losartan  (COZAAR ) 25 MG tablet Take 25 mg by mouth daily. 11/14/24  Yes [provider]  metoprolol  (TOPROL -XL) 200 MG 24 hr tablet Take 200 mg by mouth daily.   Yes [provider]  mirtazapine  (REMERON ) 7.5 MG tablet Take 7.5 mg by mouth at bedtime. 10/07/24  Yes [provider]  multivitamin (RENA-VIT) TABS tablet Take 1 tablet by mouth at bedtime. 10/07/24  Yes Jerilynn Daphne SAILOR, NP  omeprazole  (PRILOSEC) 40 MG capsule Take 1 capsule (40 mg total) by mouth 2 (two) times daily. for heartburn. 09/12/24  Yes Ghimire, Donalda HERO, MD  rosuvastatin  (CRESTOR ) 10 MG tablet TAKE 1 TABLET BY MOUTH EVERY DAY FOR CHOLESTEROL 03/21/24  Yes Clark, Katherine K, NP  famotidine  (PEPCID ) 10 MG tablet Take 1 tablet (10 mg total) by mouth daily. Patient not taking: Reported on 11/15/2024 10/08/24   Jerilynn Daphne SAILOR, NP  feeding supplement (ENSURE PLUS HIGH PROTEIN) LIQD Take 237 mLs by mouth 2 (two) times daily between meals. Patient not taking: Reported on 11/15/2024  10/07/24   Jerilynn Daphne SAILOR, NP  fludrocortisone  (FLORINEF ) 0.1 MG tablet Take 1 tablet (0.1 mg total) by mouth daily. Patient not taking: Reported on 11/15/2024 10/07/24   Jerilynn Daphne SAILOR, NP  fluticasone  (FLOVENT  HFA) 110 MCG/ACT inhaler Inhale 1 puff into the lungs 2 (two) times daily. Patient not taking: Reported on 11/15/2024    [provider]  levothyroxine  (SYNTHROID ) 150 MCG tablet TAKE 1 TABLET BY MOUTH EVERY DAY BEFORE BREAKFAST Patient not taking: Reported on 11/15/2024 10/14/24   Trixie File, MD  lidocaine  (LIDODERM ) 5 % PLACE 1 PATCH ONTO THE SKIN DAILY. REMOVE & DISCARD PATCH WITHIN 12 HOURS OR AS DIRECTED BY MD Patient not taking: Reported on 11/15/2024 04/21/24   Clark, Katherine K, NP  mirtazapine  (REMERON ) 15 MG tablet Take 1 tablet (15 mg total) by mouth at bedtime. For appetite. Patient not taking: Reported on 11/15/2024 10/25/24   Clark, Katherine K, NP  ondansetron  (ZOFRAN -ODT) 4 MG disintegrating tablet Take 1 tablet (4 mg total) by mouth every 8 (eight) hours as needed for nausea or vomiting. Patient not taking: Reported on 11/15/2024 10/25/24   Clark, Katherine K, NP  oxyCODONE  (OXY IR/ROXICODONE ) 5 MG immediate release tablet Take 1 tablet (5 mg total) by mouth every 6 (six) hours as needed for moderate pain (pain score 4-6). Patient not taking: Reported on 11/15/2024 10/07/24   Jerilynn Daphne SAILOR, NP  saccharomyces boulardii (FLORASTOR) 250 MG capsule Take 1 capsule (250 mg total) by mouth daily. Patient not taking: Reported on 11/15/2024 10/08/24   Jerilynn Daphne SAILOR, NP  sevelamer  carbonate (RENVELA ) 800 MG tablet Take 2 tablets (1,600 mg total) by mouth 3 (three) times daily with meals. Patient not taking: Reported on 11/15/2024 10/07/24   Jerilynn Daphne SAILOR, NP  traZODone  (DESYREL ) 50 MG tablet Take 0.5 tablets (25 mg total) by mouth at bedtime as needed for sleep. Patient not taking: Reported  on 11/15/2024 10/07/24   Jerilynn Daphne SAILOR, NP  budesonide   (PULMICORT ) 0.25 MG/2ML nebulizer solution Take 2 mLs (0.25 mg total) by nebulization 2 (two) times daily. 10/07/24 10/07/24  Jerilynn Daphne SAILOR, NP    Discontinued Meds:   Medications Discontinued During This Encounter  Medication Reason   Lancets (ONETOUCH ULTRASOFT) lancets Patient Preference   Darbepoetin Alfa  (ARANESP ) 100 MCG/0.5ML SOSY injection    hydrocortisone  (CORTEF ) tablet 5-15 mg Duplicate   dialysis solution 2.5% low-MG/low-CA dianeal  solution    dialysis solution 1.5% low-MG/low-CA dianeal  solution    hydrocortisone  (CORTEF ) tablet 5 mg    hydrocortisone  (CORTEF ) tablet 5 mg    hydrocortisone  (CORTEF ) tablet 15 mg    hydrocortisone  (CORTEF ) tablet 5 mg    dextrose  10 % infusion     Social History:  reports that she quit smoking about 7 years ago. Her smoking use included cigarettes. She started smoking about 7 years ago. She has a 7.5 pack-year smoking history. She has never used smokeless tobacco. She reports that she does not currently use alcohol . She reports that she does not use drugs.  Family History:   Family History  Problem Relation Age of Onset   Hypertension Mother    Cancer Mother        laryngeal   Heart disease Mother        stent   Pancreatic cancer Father    Hypertension Father    Lung cancer Father 26       hx smoking   Hypertension Sister    Hypertension Sister    Hypertension Brother    Breast cancer Maternal Aunt 30   Breast cancer Maternal Aunt        dx 60+   Cancer Maternal Uncle        Lung CA   Breast cancer Paternal Aunt 51   Breast cancer Paternal Aunt        dx 24's   Breast cancer Paternal Aunt        dx 50's   Prostate cancer Paternal Uncle    Prostate cancer Paternal Uncle    Lung cancer Paternal Uncle    Breast cancer Cousin 18   Breast cancer Cousin        dx <50   Breast cancer Cousin        dx <50   Breast cancer Cousin        dx <50   Heart disease Other    Hypertension Other    Stroke Other         Grandparent   Kidney disease Other        Grandparent   Diabetes Other        FH of Diabetes   Colon cancer Neg Hx    Esophageal cancer Neg Hx    Rectal cancer Neg Hx    Stomach cancer Neg Hx     Blood pressure 139/61, pulse 93, temperature 97.7 F (36.5 C), temperature source Oral, resp. rate 13, height 5' 5 (1.651 m), weight 75 kg, last menstrual period 03/31/2014, SpO2 97%. Physical Exam: General appearance: NAD Head: NCAT Neck: no bruit, supple Back: No CVA tenderness. Resp: CTA b/l Cardio: regular rate and rhythm GI: soft, non-tender; bowel sounds normal Extremities: extremities normal, atraumatic, no cyanosis or edema Pulses: 2+ and symmetric    Jovee Dettinger, LYNWOOD ORN, MD 11/17/2024, 9:19 AM      [1]  Allergies Allergen Reactions   Genvoya [Elviteg-Cobic-Emtricit-Tenofaf] Hives   Lisinopril Cough  Aldactone  [Spironolactone ] Hives   Valium  [Diazepam ] Other (See Comments)    Lethargy (pt states she cannot take this again)   Tegaderm Ag Mesh [Silver] Itching

## 2024-11-17 NOTE — Plan of Care (Signed)
" °  Problem: Education: Goal: Knowledge of General Education information will improve Description: Including pain rating scale, medication(s)/side effects and non-pharmacologic comfort measures Outcome: Progressing   Problem: Health Behavior/Discharge Planning: Goal: Ability to manage health-related needs will improve Outcome: Progressing   Problem: Clinical Measurements: Goal: Ability to maintain clinical measurements within normal limits will improve Outcome: Progressing Goal: Will remain free from infection Outcome: Progressing Goal: Diagnostic test results will improve Outcome: Progressing Goal: Respiratory complications will improve Outcome: Progressing Goal: Cardiovascular complication will be avoided Outcome: Progressing   Problem: Activity: Goal: Risk for activity intolerance will decrease Outcome: Progressing   Problem: Elimination: Goal: Will not experience complications related to bowel motility Outcome: Progressing Goal: Will not experience complications related to urinary retention Outcome: Progressing   Problem: Pain Managment: Goal: General experience of comfort will improve and/or be controlled Outcome: Progressing   Problem: Safety: Goal: Ability to remain free from injury will improve Outcome: Progressing   Problem: Skin Integrity: Goal: Risk for impaired skin integrity will decrease Outcome: Progressing   Problem: Education: Goal: Ability to describe self-care measures that may prevent or decrease complications (Diabetes Survival Skills Education) will improve Outcome: Progressing Goal: Individualized Educational Video(s) Outcome: Progressing   Problem: Coping: Goal: Ability to adjust to condition or change in health will improve Outcome: Progressing   Problem: Fluid Volume: Goal: Ability to maintain a balanced intake and output will improve Outcome: Progressing   Problem: Health Behavior/Discharge Planning: Goal: Ability to identify and  utilize available resources and services will improve Outcome: Progressing Goal: Ability to manage health-related needs will improve Outcome: Progressing   Problem: Metabolic: Goal: Ability to maintain appropriate glucose levels will improve Outcome: Progressing   Problem: Nutritional: Goal: Maintenance of adequate nutrition will improve Outcome: Progressing Goal: Progress toward achieving an optimal weight will improve Outcome: Progressing   Problem: Skin Integrity: Goal: Risk for impaired skin integrity will decrease Outcome: Progressing   Problem: Tissue Perfusion: Goal: Adequacy of tissue perfusion will improve Outcome: Progressing   Problem: Nutrition: Goal: Adequate nutrition will be maintained Outcome: Not Progressing   Problem: Coping: Goal: Level of anxiety will decrease Outcome: Not Progressing   "

## 2024-11-17 NOTE — Progress Notes (Signed)
 "  PROGRESS NOTE    [name]  FMW:[id number] DOB: 19-Sep-1966 DOA: 11/15/2024 PCP: Gretta Comer POUR, NP   Brief Narrative: [name] is a 59 y.o. female with a history of hypertension, hyperlipidemia, diabetes mellitus, hypothyroidism, HIV, ESRD on PD, chronic systolic heart failure, anxiety, depression, dysphagia, aortic aneurysm, aortic dissection s/p repair, pituitary apoplexy, adrenal insufficiency, sarcoid, anemia, breast cancer, gout, asthma.  Patient presented secondary to weakness and fatigue, found to have hyperkalemia with mild hyponatremia. During workup, patient was also found to have a significantly elevated alkaline phosphatase with elevated AST/ALT. GI consulted.   Assessment and Plan:  Hyperkalemia Unclear etiology but in relation to renal disease. Resolved with with Lokelma  and dialysis.  ESRD on PD Nephrology consulted for peritoneal dialysis.  Hyponatremia Mild. Management with peritoneal dialysis.  Diabetes mellitus type 2 Well controlled based on most recent hemoglobin A1C of 5.6%.  Dysphagia Esophageal dysmotility Patient with workup at [facility]. Recommendation at that time was for discharge with an NG tube with home health services, in addition to outpatient follow-up for manometry. - Continue tube feeds - Renal diet - Dietician recommendations  Elevated alkaline phosphatase Elevated AST/ALT Unclear etiology. GGT elevated, suggesting biliary source. RUQ ultrasound significant for cholelithiasis without evidence of cholecystitis; patient without pain. GI recommendation for observation/conservative management  Pituitary apoplexy Adrenal insufficiency - Continue hydrocortisone  (15 mg q0600 and 5 mg q1400)  Hypotension Likely related to adrenal insufficiency. Patient started on midodrine  this admission. Blood pressure improved. - Discontinue midodrine  - Continue treatment of adrenal sufficiency  Hypothyroidism S/p thyroidectomy. -  Continue Synthroid   Chronic HFrEF LVEF of 20% on most recent Transthoracic Echocardiogram from admission at [facility]. Not able to tolerate goal directed medical therapy secondary to hypotension. - Strict in/out  HIV disease - Continue Descovy  and [name]   History of aortic dissection Aortic aneurysm S/p repair of dissection. - Continue aspirin   Chronic anemia Likely anemia of chronic kidney disease. Hemoglobin is stable.  Asthma - Continue albuterol  as needed  Gout - Continue allopurinol   Depression Anxiety - Continue Remeron  and BuSpar   History of breast cancer Patient is s/p lumpectomy, chemotherapy and immunotherapy     DVT prophylaxis: Subcutaneous heparin  Code Status:   Code Status: Full Code Family Communication: None at bedside Disposition Plan: Discharge pending ongoing biliary workup, PT/OT recommendations, specialist recommendations   Consultants:  Nephrology Gastroenterology  Procedures:  Peritoneal dialysis  Antimicrobials: None    Subjective: Patient reports no issues this morning. Did not feel well last night.  Objective: BP 139/61 (BP Location: Right Arm)   Pulse 93   Temp 97.7 F (36.5 C) (Oral)   Resp 13   Ht 5' 5 (1.651 m)   Wt 75 kg   LMP 03/31/2014   SpO2 97%   BMI 27.51 kg/m   Examination:  General exam: Appears calm and comfortable. Respiratory system: Clear to auscultation. Respiratory effort normal. Cardiovascular system: S1 & S2 heard, RRR. Gastrointestinal system: Abdomen is nondistended, soft and nontender. Normal bowel sounds heard. Central nervous system: Alert and oriented. No focal neurological deficits. Psychiatry: Judgement and insight appear normal. Mood & affect appropriate.    Data Reviewed: I have personally reviewed following labs and imaging studies  CBC Lab Results  Component Value Date   WBC 17.7 (H) 11/17/2024   RBC 2.45 (L) 11/17/2024   HGB 8.6 (L) 11/17/2024   HCT 24.1 (L) 11/17/2024   MCV  98.4 11/17/2024   MCH 35.1 (H) 11/17/2024   PLT 151 11/17/2024  MCHC 35.7 11/17/2024   RDW 16.3 (H) 11/17/2024   LYMPHSABS 1.5 11/15/2024   MONOABS 0.9 11/15/2024   EOSABS 0.1 11/15/2024   BASOSABS 0.0 11/15/2024     Last metabolic panel Lab Results  Component Value Date   NA 124 (L) 11/17/2024   K 4.8 11/17/2024   CL 82 (L) 11/17/2024   CO2 22 11/17/2024   BUN 92 (H) 11/17/2024   CREATININE 8.30 (H) 11/17/2024   GLUCOSE 218 (H) 11/17/2024   GFRNONAA 5 (L) 11/17/2024   GFRAA 39 (L) 07/14/2020   CALCIUM  8.6 (L) 11/17/2024   PHOS 3.8 11/16/2024   PROT 6.6 11/17/2024   ALBUMIN  2.3 (L) 11/17/2024   BILITOT 0.6 11/17/2024   ALKPHOS 1,196 (H) 11/17/2024   AST 82 (H) 11/17/2024   ALT 64 (H) 11/17/2024   ANIONGAP 19 (H) 11/17/2024    GFR: Estimated Creatinine Clearance: 7.7 mL/min (A) (by C-G formula based on SCr of 8.3 mg/dL (H)).  Recent Results (from the past 240 hours)  C Difficile Quick Screen w PCR reflex     Status: None   Collection Time: 11/15/24  1:03 PM   Specimen: STOOL  Result Value Ref Range Status   C Diff antigen NEGATIVE NEGATIVE Final   C Diff toxin NEGATIVE NEGATIVE Final   C Diff interpretation No C. difficile detected.  Final    Comment: Performed at [facility], [address] [place], [place] [address]      Radiology Studies: US  Abdomen Limited RUQ (LIVER/GB) Result Date: 11/15/2024 CLINICAL DATA:  Elevated liver function tests EXAM: ULTRASOUND ABDOMEN LIMITED RIGHT UPPER QUADRANT COMPARISON:  10/24/2024, 06/25/2022 FINDINGS: Gallbladder: 15 mm shadowing gallstone is identified. No evidence of gallbladder wall thickening or pericholecystic fluid to suggest cholecystitis. Negative sonographic Murphy sign. 1.4 cm hyperechoic region arising from the ventral aspect of the gallbladder fundus consistent with adenomyomatosis as evaluated on previous MRI. Common bile duct: Diameter: 2 mm Liver: Increased liver echotexture consistent with hepatic  steatosis. Portal vein is patent on color Doppler imaging with normal direction of blood flow towards the liver. Other: None. IMPRESSION: 1. Cholelithiasis without evidence of acute cholecystitis. 2. Focal adenomyomatosis of the gallbladder fundus, unchanged since numerous prior exams. 3. Increased liver echotexture consistent with hepatic steatosis. Electronically Signed   By: Ozell Daring M.D.   On: 11/15/2024 21:18      LOS: 1 day    Elgin Lam, MD [facility] 11/17/2024, 1:09 PM   If 7PM-7AM, please contact night-coverage [email or web address]  "

## 2024-11-18 ENCOUNTER — Inpatient Hospital Stay (HOSPITAL_COMMUNITY)

## 2024-11-18 ENCOUNTER — Encounter (HOSPITAL_COMMUNITY): Payer: Self-pay | Admitting: Internal Medicine

## 2024-11-18 DIAGNOSIS — E1169 Type 2 diabetes mellitus with other specified complication: Secondary | ICD-10-CM | POA: Diagnosis not present

## 2024-11-18 DIAGNOSIS — E559 Vitamin D deficiency, unspecified: Secondary | ICD-10-CM | POA: Diagnosis not present

## 2024-11-18 DIAGNOSIS — M7989 Other specified soft tissue disorders: Secondary | ICD-10-CM | POA: Diagnosis not present

## 2024-11-18 DIAGNOSIS — Z21 Asymptomatic human immunodeficiency virus [HIV] infection status: Secondary | ICD-10-CM | POA: Diagnosis not present

## 2024-11-18 DIAGNOSIS — Z992 Dependence on renal dialysis: Secondary | ICD-10-CM

## 2024-11-18 DIAGNOSIS — N186 End stage renal disease: Secondary | ICD-10-CM

## 2024-11-18 DIAGNOSIS — E875 Hyperkalemia: Secondary | ICD-10-CM | POA: Diagnosis not present

## 2024-11-18 DIAGNOSIS — R748 Abnormal levels of other serum enzymes: Secondary | ICD-10-CM

## 2024-11-18 DIAGNOSIS — E213 Hyperparathyroidism, unspecified: Secondary | ICD-10-CM

## 2024-11-18 DIAGNOSIS — E039 Hypothyroidism, unspecified: Secondary | ICD-10-CM | POA: Diagnosis not present

## 2024-11-18 LAB — GLUCOSE, CAPILLARY
Glucose-Capillary: 110 mg/dL — ABNORMAL HIGH (ref 70–99)
Glucose-Capillary: 115 mg/dL — ABNORMAL HIGH (ref 70–99)
Glucose-Capillary: 134 mg/dL — ABNORMAL HIGH (ref 70–99)
Glucose-Capillary: 135 mg/dL — ABNORMAL HIGH (ref 70–99)
Glucose-Capillary: 137 mg/dL — ABNORMAL HIGH (ref 70–99)
Glucose-Capillary: 98 mg/dL (ref 70–99)

## 2024-11-18 LAB — HEPATIC FUNCTION PANEL
ALT: 59 U/L — ABNORMAL HIGH (ref 0–44)
AST: 70 U/L — ABNORMAL HIGH (ref 15–41)
Albumin: 2.3 g/dL — ABNORMAL LOW (ref 3.5–5.0)
Alkaline Phosphatase: 1092 U/L — ABNORMAL HIGH (ref 38–126)
Bilirubin, Direct: 0.3 mg/dL — ABNORMAL HIGH (ref 0.0–0.2)
Indirect Bilirubin: 0.2 mg/dL — ABNORMAL LOW (ref 0.3–0.9)
Total Bilirubin: 0.5 mg/dL (ref 0.0–1.2)
Total Protein: 6.3 g/dL — ABNORMAL LOW (ref 6.5–8.1)

## 2024-11-18 LAB — MISC LABCORP TEST (SEND OUT): Labcorp test code: 1637

## 2024-11-18 LAB — HEPATITIS C ANTIBODY: HCV Ab: NONREACTIVE

## 2024-11-18 LAB — BASIC METABOLIC PANEL WITH GFR
Anion gap: 20 — ABNORMAL HIGH (ref 5–15)
BUN: 101 mg/dL — ABNORMAL HIGH (ref 6–20)
CO2: 23 mmol/L (ref 22–32)
Calcium: 8.6 mg/dL — ABNORMAL LOW (ref 8.9–10.3)
Chloride: 84 mmol/L — ABNORMAL LOW (ref 98–111)
Creatinine, Ser: 8.01 mg/dL — ABNORMAL HIGH (ref 0.44–1.00)
GFR, Estimated: 5 mL/min — ABNORMAL LOW
Glucose, Bld: 135 mg/dL — ABNORMAL HIGH (ref 70–99)
Potassium: 4.4 mmol/L (ref 3.5–5.1)
Sodium: 128 mmol/L — ABNORMAL LOW (ref 135–145)

## 2024-11-18 LAB — HEPATITIS B SURFACE ANTIGEN: Hepatitis B Surface Ag: NONREACTIVE

## 2024-11-18 LAB — APTT: aPTT: 39 s — ABNORMAL HIGH (ref 24–36)

## 2024-11-18 LAB — HEPATITIS A ANTIBODY, IGM: Hep A IgM: NONREACTIVE

## 2024-11-18 MED ORDER — ONDANSETRON HCL 4 MG/2ML IJ SOLN
4.0000 mg | Freq: Four times a day (QID) | INTRAMUSCULAR | Status: AC | PRN
  Administered 2024-11-18 – 2024-11-19 (×2): 4 mg via INTRAVENOUS
  Filled 2024-11-18 (×2): qty 2

## 2024-11-18 MED ORDER — DELFLEX-LC/1.5% DEXTROSE 344 MOSM/L IP SOLN: INTRAPERITONEAL | Status: DC

## 2024-11-18 MED ORDER — HEPARIN BOLUS VIA INFUSION
4000.0000 [IU] | Freq: Once | INTRAVENOUS | Status: AC
  Administered 2024-11-18: 4000 [IU] via INTRAVENOUS
  Filled 2024-11-18: qty 4000

## 2024-11-18 MED ORDER — HYDROCERIN EX CREA
TOPICAL_CREAM | Freq: Two times a day (BID) | CUTANEOUS | Status: AC
  Filled 2024-11-18: qty 113

## 2024-11-18 MED ORDER — DELFLEX-LC/2.5% DEXTROSE 394 MOSM/L IP SOLN: INTRAPERITONEAL | Status: DC

## 2024-11-18 MED ORDER — HEPARIN (PORCINE) 25000 UT/250ML-% IV SOLN
1250.0000 [IU]/h | INTRAVENOUS | Status: DC
  Administered 2024-11-18: 1250 [IU]/h via INTRAVENOUS
  Administered 2024-11-19: 1150 [IU]/h via INTRAVENOUS
  Filled 2024-11-18 (×2): qty 250

## 2024-11-18 NOTE — Progress Notes (Signed)
 PHARMACY - ANTICOAGULATION CONSULT NOTE  Pharmacy Consult for heparin  Indication: DVT  Allergies[1]  Patient Measurements: Height: 5' 5 (165.1 cm) Weight: 75 kg (165 lb 5.5 oz) IBW/kg (Calculated) : 57 HEPARIN  DW (KG): 72.6  Vital Signs: Temp: 97.6 F (36.4 C) (02/02 1130) Temp Source: Oral (02/02 1130) BP: 137/72 (02/02 0755) Pulse Rate: 89 (02/02 0755)  Labs: Recent Labs    11/16/24 0258 11/17/24 0155 11/17/24 1945 11/18/24 0148  HGB 9.2* 8.6*  --   --   HCT 25.7* 24.1*  --   --   PLT 138* 151  --   --   LABPROT  --  14.6  --   --   INR  --  1.1  --   --   CREATININE 9.10* 8.30* 8.63* 8.01*    Estimated Creatinine Clearance: 7.9 mL/min (A) (by C-G formula based on SCr of 8.01 mg/dL (H)).   Medical History: Past Medical History:  Diagnosis Date   Acute pain of right shoulder due to trauma 06/08/2023   Acute pancreatitis 10/23/2021   Acute renal failure superimposed on stage 4 chronic kidney disease (HCC) 10/24/2021   Allergy    Anemia    Normocytic   Antibiotic-induced yeast infection 09/28/2020   Anxiety    Asthma    Bronchitis 2005   Bursitis of left shoulder 09/06/2019   CKD (chronic kidney disease)    Dialysis on Mon-Wed- Fri   CLASS 1-EXOPHTHALMOS-THYROTOXIC 02/08/2007   Cognitive dysfunction 02/21/2020   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  Genes Analyzed (43  total): APC*, ATM*, AXIN2, BARD1, BMPR1A, BRCA1*, BRCA2*, BRIP1*, CDH1*, CDK4, CDKN2A, CHEK2*, DICER1, GALNT12, HOXB13, MEN1, MLH1*, MRE11A, MSH2*, MSH3, MSH6*, MUTYH*, NBN, NF1*, NTHL1, PALB2*, PMS2*, POLD1, POLE, PTEN*, RAD50, RAD51C*, RAD51D*, RET, SDHB, SDHD, SMAD4, SMARCA4, STK11 and TP53* (sequencing and deletion/duplication); EGFR (s   GERD 07/24/2006   GRAVE'S DISEASE 01/01/2008   History of hidradenitis suppurativa    History of kidney stones    History of thrush    HIV DISEASE 07/24/2006   dx March 05   Hyperlipidemia    HYPERTENSION 07/24/2006   Hyperthyroidism 08/2006   Grave's Disease -diffuse radiotracer uptake 08/25/06 Thyroid  scan-Cold nodule to R lower lobe of thyrorid   Ileus (HCC) 02/14/2023   Left bundle branch block (LBBB)    Malnutrition of moderate degree 02/08/2023   Menometrorrhagia    hx of   Nephrolithiasis    Panniculitis 05/12/2020   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 Completed 2 yrs Prednisone  after lung bx confirmation. No symptoms since then.   Sebaceous cyst 04/21/2020   Shock (HCC) 09/23/2024   Suppurative hidradenitis    Thyroid  cancer (HCC)    THYROID  NODULE, RIGHT 02/08/2007    Medications:  Medications Prior to Admission  Medication Sig Dispense Refill Last Dose/Taking   acetaminophen  (TYLENOL ) 325 MG tablet Take 1-2 tablets (325-650  mg total) by mouth every 4 (four) hours as needed for mild pain (pain score 1-3).   11/14/2024   albuterol  (VENTOLIN  HFA) 108 (90 Base) MCG/ACT inhaler Inhale 2 puffs into the lungs every 6 (six) hours as needed for wheezing or shortness of breath.   Unknown   allopurinol  (ZYLOPRIM ) 100 MG tablet TAKE 1 TABLET (100 MG TOTAL) BY MOUTH DAILY. FOR GOUT PREVENTION 90 tablet 2 11/14/2024   aspirin  81 MG chewable tablet Chew 1 tablet (81 mg total) by mouth daily. 30 tablet 0 11/14/2024   busPIRone  (BUSPAR ) 5 MG tablet Take 2 tablets (10 mg total) by mouth 2 (two) times daily.   11/14/2024   calcitRIOL  (ROCALTROL ) 0.25 MCG capsule Take 0.25 mcg by mouth daily.   11/14/2024   dolutegravir  ([name] ) 50 MG tablet Take 1 tablet (50 mg total) by mouth daily. 30 tablet 11 11/14/2024   emtricitabine -tenofovir  AF (DESCOVY ) 200-25 MG tablet Take 1 tablet by mouth daily. 30 tablet 11 11/14/2024   hydrocortisone  (CORTEF ) 5 MG tablet Take 3 tablets (15 mg total) by mouth 2 (two) times daily. (Patient taking differently: Take 5-15 mg by mouth See admin instructions. Taking 3 tablets (15 mg) by mouth when PT wakes up in the AM and 1 tablet (5 mg) at 3 pm.) 180 tablet 0 11/14/2024 Morning   levocetirizine (XYZAL ) 5 MG tablet TAKE 1 TABLET BY MOUTH EVERY DAY IN THE EVENING FOR ALLERGIES (Patient taking differently: Take 5 mg by mouth every evening.) 90 tablet 1 11/14/2024   levothyroxine  (SYNTHROID ) 125 MCG tablet Take 125 mcg by mouth daily.   11/14/2024   loratadine  (CLARITIN ) 10 MG tablet Take 1 tablet by mouth daily.   11/14/2024   losartan  (COZAAR ) 25 MG tablet Take 25 mg by mouth daily.   11/14/2024   metoprolol  (TOPROL -XL) 200 MG 24 hr tablet Take 200 mg by mouth daily.   11/14/2024   mirtazapine  (REMERON ) 7.5 MG tablet Take 7.5 mg by mouth at bedtime.   11/12/2024   multivitamin (RENA-VIT) TABS tablet Take 1 tablet by mouth at bedtime. 100 tablet 0 11/13/2024   omeprazole  (PRILOSEC) 40 MG capsule Take 1 capsule (40 mg total) by  mouth 2 (two) times daily. for heartburn. 90 capsule 2 11/14/2024   rosuvastatin  (CRESTOR ) 10 MG tablet TAKE 1 TABLET BY MOUTH EVERY DAY FOR CHOLESTEROL 90 tablet 2 11/14/2024   famotidine  (PEPCID ) 10 MG tablet Take 1 tablet (10 mg total) by mouth daily. (Patient not taking: Reported on 11/15/2024) 30 tablet 0 Not Taking   feeding supplement (ENSURE PLUS HIGH PROTEIN) LIQD Take 237 mLs by mouth 2 (two) times daily between meals. (Patient not taking: Reported on 11/15/2024)   Not Taking   fludrocortisone  (FLORINEF ) 0.1 MG tablet Take 1 tablet (0.1 mg total) by mouth daily. (Patient not taking: Reported on 11/15/2024) 30 tablet 0 Not Taking   fluticasone  (FLOVENT  HFA) 110 MCG/ACT inhaler  Inhale 1 puff into the lungs 2 (two) times daily. (Patient not taking: Reported on 11/15/2024)   Not Taking   levothyroxine  (SYNTHROID ) 150 MCG tablet TAKE 1 TABLET BY MOUTH EVERY DAY BEFORE BREAKFAST (Patient not taking: Reported on 11/15/2024) 30 tablet 0 Not Taking   lidocaine  (LIDODERM ) 5 % PLACE 1 PATCH ONTO THE SKIN DAILY. REMOVE & DISCARD PATCH WITHIN 12 HOURS OR AS DIRECTED BY MD (Patient not taking: Reported on 11/15/2024) 30 patch 0 Not Taking   mirtazapine  (REMERON ) 15 MG tablet Take 1 tablet (15 mg total) by mouth at bedtime. For appetite. (Patient not taking: Reported on 11/15/2024) 90 tablet 0 Not Taking   ondansetron  (ZOFRAN -ODT) 4 MG disintegrating tablet Take 1 tablet (4 mg total) by mouth every 8 (eight) hours as needed for nausea or vomiting. (Patient not taking: Reported on 11/15/2024) 30 tablet 0 Not Taking   oxyCODONE  (OXY IR/ROXICODONE ) 5 MG immediate release tablet Take 1 tablet (5 mg total) by mouth every 6 (six) hours as needed for moderate pain (pain score 4-6). (Patient not taking: Reported on 11/15/2024) 28 tablet 0 Not Taking   saccharomyces boulardii (FLORASTOR) 250 MG capsule Take 1 capsule (250 mg total) by mouth daily. (Patient not taking: Reported on 11/15/2024) 30 capsule 0 Not Taking   sevelamer   carbonate (RENVELA ) 800 MG tablet Take 2 tablets (1,600 mg total) by mouth 3 (three) times daily with meals. (Patient not taking: Reported on 11/15/2024) 180 tablet 0 Not Taking   traZODone  (DESYREL ) 50 MG tablet Take 0.5 tablets (25 mg total) by mouth at bedtime as needed for sleep. (Patient not taking: Reported on 11/15/2024) 15 tablet 0 Not Taking    Assessment: [name] is a 59 y.o. female with a PMH notable for HIV, ESRD on PD, aortic aneurysm, aortic dissection s/p repair, anemia, breast cancer.  Venous duplex done this afternoon consistent with acute deep vein thrombosis acute superficial vein thrombosis. Pharmacy consulted to start heparin  infusion.  Not on anticoagulation prior to admission. Last dose of subQ heparin  was 2/1 @ 1712  Goal of Therapy:  Heparin  level 0.3-0.7 units/ml Monitor platelets by anticoagulation protocol: Yes   Plan:  Give 4000 units bolus x 1 Start heparin  infusion at 1250 units/hr Check anti-Xa level in 8 hours and daily while on heparin  Continue to monitor H&H and platelets   Thank you for allowing pharmacy to be a part of this patients care.   Bascom JAYSON Louder, PharmD 11/18/2024 7:19 PM  **Pharmacist phone directory can be found on [email or web address] listed under [facility]**     [1]  Allergies Allergen Reactions   Genvoya [Elviteg-Cobic-Emtricit-Tenofaf] Hives   Lisinopril Cough   Aldactone  [Spironolactone ] Hives   Valium  [Diazepam ] Other (See Comments)    Lethargy (pt states she cannot take this again)   Tegaderm Ag Mesh [Silver] Itching

## 2024-11-18 NOTE — Progress Notes (Signed)
 Pt receives PD care through [facility] home therapy dept. Will assist as needed.   Randine Mungo Dialysis Navigator [phone]

## 2024-11-18 NOTE — Evaluation (Signed)
 Occupational Therapy Evaluation Patient Details Name: [name] MRN: [id number] DOB: 1966/08/08 Today's Date: 11/18/2024   History of Present Illness   Patient is 59 yo female admitted on 11/15/24 for presented secondary to weakness and fatigue, found to have hyperkalemia with mild hyponatremia. PMH: ESRD on PD, anemia, anxiety, asthma, T2DM, aortic dissection s/p endovascular repair, GERD, Grave's disease, HTN, HLD, papillary thyroid  cancer s/p thyroidectomy (2018) PVD, falls, sarcoidosis, suppurative hydradenitis     Clinical Impressions Pt presents with condition above with problems mentioned below. Evaluation limited by pt need for use of bed pan after having just sat up with heavy assist. Upon eval, pt needing mod A and multimodal cues for bed mobility as well as total A for toileting at bed level. Pt and family in room reports that home has been very difficult since recent hospitalization at [facility] with inability to get up without assist and difficulty with PO intake. Patient will benefit from continued inpatient follow up therapy, <3 hours/day      If plan is discharge home, recommend the following:   Two people to help with walking and/or transfers;Two people to help with bathing/dressing/bathroom;Assistance with cooking/housework;Assist for transportation;Help with stairs or ramp for entrance     Functional Status Assessment   Patient has had a recent decline in their functional status and demonstrates the ability to make significant improvements in function in a reasonable and predictable amount of time.     Equipment Recommendations   Other (comment);Tub/shower bench (TBD)     Recommendations for Other Services         Precautions/Restrictions   Precautions Precautions: Fall Recall of Precautions/Restrictions: Intact Precaution/Restrictions Comments: NG feeding tube Restrictions Weight Bearing Restrictions Per Provider Order: No     Mobility Bed  Mobility Overal bed mobility: Needs Assistance Bed Mobility: Supine to Sit, Sit to Supine     Supine to sit: Mod assist, HOB elevated, Used rails Sit to supine: Mod assist   General bed mobility comments: cues for sequencing and optimal technique, Once elevating trunk, pt laying back down stating she needs to have BM; poor truncal engagement and activity tolerance. reposotioned in bed on bed pan    Transfers                          Balance Overall balance assessment: Needs assistance Sitting-balance support: Bilateral upper extremity supported, Feet supported Sitting balance-Leahy Scale: Poor                                     ADL either performed or assessed with clinical judgement   ADL Overall ADL's : Needs assistance/impaired Eating/Feeding: Set up;Bed level   Grooming: Set up;Bed level                                       Vision   Vision Assessment?: No apparent visual deficits Additional Comments: not formally assessed     Perception Perception: Not tested       Praxis Praxis: Not tested       Pertinent Vitals/Pain Pain Assessment Pain Assessment: No/denies pain     Extremity/Trunk Assessment Upper Extremity Assessment Upper Extremity Assessment: Generalized weakness;LUE deficits/detail LUE Deficits / Details: edematous as compared to R   Lower Extremity Assessment Lower Extremity Assessment: Defer to PT  evaluation   Cervical / Trunk Assessment Cervical / Trunk Assessment: Normal   Communication Communication Communication: No apparent difficulties   Cognition Arousal: Alert Behavior During Therapy: Flat affect Cognition: Difficult to assess Difficult to assess due to:  (pt internally distracted by other needs during session, wanting to talk to MD, etc)           OT - Cognition Comments: pt follows one step commands.                 Following commands: Intact       Cueing  General  Comments   Cueing Techniques: Verbal cues;Gestural cues  VSS on RA   Exercises     Shoulder Instructions      Home Living Family/patient expects to be discharged to:: Private residence Living Arrangements: Alone Available Help at Discharge: Family;Available PRN/intermittently Type of Home: Other(Comment) (townhome) Home Access: Stairs to enter Entrance Stairs-Number of Steps: 3 Entrance Stairs-Rails: None Home Layout: Two level;Bed/bath upstairs;Able to live on main level with bedroom/bathroom;1/2 bath on main level Alternate Level Stairs-Number of Steps: 12-14   Bathroom Shower/Tub: Chief Strategy Officer: Standard Bathroom Accessibility: Yes   Home Equipment: Agricultural Consultant (2 wheels)   Additional Comments: in decline over past 3 months, family has been bringing her food. bc she says that she has lost her appetite and she is not eating, so she has gone from 220lbs to 160lbs (eating once a day), has fridge and microwave in spare room upstairs      Prior Functioning/Environment Prior Level of Function : Independent/Modified Independent;History of Falls (last six months)             Mobility Comments: recently using RW, family has moved patient's bedroom to downstairs recently. Reports two falls from weakness. ADLs Comments: Mod I with ADL's prior to one month ago; for the past month, sister assisting intermittently, predominantly with bathing and bringing meals    OT Problem List: Decreased strength;Decreased activity tolerance;Impaired balance (sitting and/or standing);Decreased knowledge of use of DME or AE   OT Treatment/Interventions: Self-care/ADL training;Therapeutic exercise;DME and/or AE instruction;Therapeutic activities;Balance training;Patient/family education      OT Goals(Current goals can be found in the care plan section)   Acute Rehab OT Goals Patient Stated Goal: get better OT Goal Formulation: With patient Time For Goal Achievement:  12/02/24 Potential to Achieve Goals: Good   OT Frequency:  Min 2X/week    Co-evaluation              AM-PAC OT 6 Clicks Daily Activity     Outcome Measure Help from another person eating meals?: A Little Help from another person taking care of personal grooming?: A Little Help from another person toileting, which includes using toliet, bedpan, or urinal?: Total Help from another person bathing (including washing, rinsing, drying)?: Total Help from another person to put on and taking off regular upper body clothing?: A Little Help from another person to put on and taking off regular lower body clothing?: Total 6 Click Score: 12   End of Session Nurse Communication: Mobility status  Activity Tolerance: Other (comment) (limited by need for BM) Patient left: in bed;with call bell/phone within reach;with bed alarm set;with family/visitor present  OT Visit Diagnosis: Unsteadiness on feet (R26.81);Muscle weakness (generalized) (M62.81)                Time: 8561-8488 OT Time Calculation (min): 33 min Charges:  OT General Charges $OT Visit: 1 Visit OT Evaluation $OT  Eval Moderate Complexity: 1 Mod OT Treatments $Self Care/Home Management : 8-22 mins  [name], [name] [facility] Acute Rehabilitation Office: [phone]   Felicia JONETTA Lebron 11/18/2024, 3:46 PM

## 2024-11-18 NOTE — Progress Notes (Signed)
 Inpatient Rehab Admissions Coordinator Note:  Per pt request patient was screened for CIR candidacy by Reche FORBES Lowers, PT. At this time, pt does not appear to demonstrate medical necessity to support a CIR admission.  We will not pursue a rehab consult at this time.  Recommend other rehab venues to be pursued.  Please contact me with any questions.  [name] [name], [name] [phone] 11/18/24 10:42 AM

## 2024-11-18 NOTE — Plan of Care (Signed)
  Problem: Education: Goal: Knowledge of General Education information will improve Description: Including pain rating scale, medication(s)/side effects and non-pharmacologic comfort measures Outcome: Progressing   Problem: Health Behavior/Discharge Planning: Goal: Ability to manage health-related needs will improve Outcome: Progressing   Problem: Nutrition: Goal: Adequate nutrition will be maintained Outcome: Progressing   Problem: Coping: Goal: Level of anxiety will decrease Outcome: Progressing   Problem: Elimination: Goal: Will not experience complications related to bowel motility Outcome: Progressing

## 2024-11-18 NOTE — TOC Progression Note (Signed)
 Transition of Care [facility]) - Progression Note    Patient Details  Name: [name] MRN: [id number] Date of Birth: 24-Dec-1965  Transition of [facility]) CM/SW Contact  Waddell Barnie Rama, RN Phone Number: 11/18/2024, 11:01 AM  Clinical Narrative:    NCM was informed by CSW that patient sister would like to speak with this NCM.  NCM left vm for return call.   Expected Discharge Plan:  (TBD) Barriers to Discharge: Continued Medical Work up               Expected Discharge Plan and Services In-house Referral: Clinical Social Work Discharge Planning Services: CM Consult   Living arrangements for the past 2 months: Single Family Home                                       Social Drivers of Health (SDOH) Interventions SDOH Screenings   Food Insecurity: No Food Insecurity (11/15/2024)   Received [facility]  Housing: Low Risk  (10/30/2024)   Received from [facility]  Transportation Needs: No Transportation Needs (11/15/2024)   Received from [facility]  Utilities: Not At Risk (10/30/2024)   Received from [facility]  Alcohol  Screen: Low Risk (06/27/2024)  Depression (PHQ2-9): Medium Risk (10/23/2024)  Financial Resource Strain: Low Risk  (11/15/2024)   Received from [facility]  Physical Activity: Inactive (06/27/2024)  Social Connections: Unknown (06/27/2024)  Stress: Stress Concern Present (06/27/2024)  Tobacco Use: Medium Risk (10/23/2024)  Health Literacy: Adequate Health Literacy (06/27/2024)    Readmission Risk Interventions    09/27/2024    3:45 PM 09/12/2024    9:43 AM 02/21/2023    4:05 PM  Readmission Risk Prevention Plan  Transportation Screening Complete Complete Complete  PCP or Specialist Appt within 3-5 Days  Complete   HRI or Home Care Consult  Complete   Social Work Consult for Recovery Care Planning/Counseling  Complete   Palliative Care  Screening  Not Applicable   Medication Review Oceanographer) Complete Referral to Pharmacy Complete  PCP or Specialist appointment within 3-5 days of discharge Complete  Complete  HRI or Home Care Consult Complete  Complete  SW Recovery Care/Counseling Consult Complete  Complete  Palliative Care Screening Not Applicable  Not Applicable  Skilled Nursing Facility Not Applicable  Not Applicable

## 2024-11-18 NOTE — Progress Notes (Signed)
 Bilateral upper extremity venous duplex has been completed.  Results can be found in chart review under CV Proc.  11/18/2024 5:22 PM  [employer], RVT.

## 2024-11-19 DIAGNOSIS — N186 End stage renal disease: Secondary | ICD-10-CM | POA: Diagnosis not present

## 2024-11-19 DIAGNOSIS — Z21 Asymptomatic human immunodeficiency virus [HIV] infection status: Secondary | ICD-10-CM | POA: Diagnosis not present

## 2024-11-19 DIAGNOSIS — R748 Abnormal levels of other serum enzymes: Secondary | ICD-10-CM | POA: Diagnosis not present

## 2024-11-19 DIAGNOSIS — E039 Hypothyroidism, unspecified: Secondary | ICD-10-CM | POA: Diagnosis not present

## 2024-11-19 DIAGNOSIS — Z992 Dependence on renal dialysis: Secondary | ICD-10-CM | POA: Diagnosis not present

## 2024-11-19 DIAGNOSIS — R1314 Dysphagia, pharyngoesophageal phase: Secondary | ICD-10-CM

## 2024-11-19 DIAGNOSIS — E213 Hyperparathyroidism, unspecified: Secondary | ICD-10-CM | POA: Diagnosis not present

## 2024-11-19 DIAGNOSIS — E1169 Type 2 diabetes mellitus with other specified complication: Secondary | ICD-10-CM | POA: Diagnosis not present

## 2024-11-19 DIAGNOSIS — E875 Hyperkalemia: Secondary | ICD-10-CM | POA: Diagnosis not present

## 2024-11-19 LAB — HEMOGLOBIN AND HEMATOCRIT, BLOOD
HCT: 20.2 % — ABNORMAL LOW (ref 36.0–46.0)
HCT: 18.7 % — ABNORMAL LOW (ref 36.0–46.0)
Hemoglobin: 7 g/dL — ABNORMAL LOW (ref 12.0–15.0)
Hemoglobin: 6.5 g/dL — CL (ref 12.0–15.0)

## 2024-11-19 LAB — CBC
HCT: 20.5 % — ABNORMAL LOW (ref 36.0–46.0)
Hemoglobin: 7.4 g/dL — ABNORMAL LOW (ref 12.0–15.0)
MCH: 35.7 pg — ABNORMAL HIGH (ref 26.0–34.0)
MCHC: 36.1 g/dL — ABNORMAL HIGH (ref 30.0–36.0)
MCV: 99 fL (ref 80.0–100.0)
Platelets: 111 10*3/uL — ABNORMAL LOW (ref 150–400)
RBC: 2.07 MIL/uL — ABNORMAL LOW (ref 3.87–5.11)
RDW: 16.8 % — ABNORMAL HIGH (ref 11.5–15.5)
WBC: 13.3 10*3/uL — ABNORMAL HIGH (ref 4.0–10.5)
nRBC: 2.4 % — ABNORMAL HIGH (ref 0.0–0.2)

## 2024-11-19 LAB — GLUCOSE, CAPILLARY
Glucose-Capillary: 110 mg/dL — ABNORMAL HIGH (ref 70–99)
Glucose-Capillary: 73 mg/dL (ref 70–99)
Glucose-Capillary: 83 mg/dL (ref 70–99)
Glucose-Capillary: 85 mg/dL (ref 70–99)
Glucose-Capillary: 89 mg/dL (ref 70–99)
Glucose-Capillary: 95 mg/dL (ref 70–99)
Glucose-Capillary: 99 mg/dL (ref 70–99)

## 2024-11-19 LAB — COMPREHENSIVE METABOLIC PANEL WITH GFR
ALT: 61 U/L — ABNORMAL HIGH (ref 0–44)
AST: 87 U/L — ABNORMAL HIGH (ref 15–41)
Albumin: 2.3 g/dL — ABNORMAL LOW (ref 3.5–5.0)
Alkaline Phosphatase: 1078 U/L — ABNORMAL HIGH (ref 38–126)
Anion gap: 18 — ABNORMAL HIGH (ref 5–15)
BUN: 107 mg/dL — ABNORMAL HIGH (ref 6–20)
CO2: 25 mmol/L (ref 22–32)
Calcium: 8.9 mg/dL (ref 8.9–10.3)
Chloride: 85 mmol/L — ABNORMAL LOW (ref 98–111)
Creatinine, Ser: 7.59 mg/dL — ABNORMAL HIGH (ref 0.44–1.00)
GFR, Estimated: 6 mL/min — ABNORMAL LOW
Glucose, Bld: 78 mg/dL (ref 70–99)
Potassium: 4.1 mmol/L (ref 3.5–5.1)
Sodium: 128 mmol/L — ABNORMAL LOW (ref 135–145)
Total Bilirubin: 0.5 mg/dL (ref 0.0–1.2)
Total Protein: 6.1 g/dL — ABNORMAL LOW (ref 6.5–8.1)

## 2024-11-19 LAB — LACTATE DEHYDROGENASE: LDH: 423 U/L — ABNORMAL HIGH (ref 105–235)

## 2024-11-19 LAB — ANTI-SMOOTH MUSCLE ANTIBODY, IGG: F-Actin IgG: 9 U (ref 0–19)

## 2024-11-19 LAB — CMV ANTIBODY, IGG (EIA)

## 2024-11-19 LAB — MITOCHONDRIAL ANTIBODIES: Mitochondrial M2 Ab, IgG: <20 U (ref 0.0–20.0)

## 2024-11-19 LAB — ANTI-MICROSOMAL ANTIBODY LIVER / KIDNEY: LKM1 Ab: 1.6 U (ref 0.0–20.0)

## 2024-11-19 LAB — HEPARIN LEVEL (UNFRACTIONATED)
Heparin Unfractionated: 0.71 [IU]/mL — ABNORMAL HIGH (ref 0.30–0.70)
Heparin Unfractionated: 0.44 [IU]/mL (ref 0.30–0.70)

## 2024-11-19 LAB — PREPARE RBC (CROSSMATCH)

## 2024-11-19 MED ORDER — VITAMIN D (ERGOCALCIFEROL) 1.25 MG (50000 UNIT) PO CAPS
50000.0000 [IU] | ORAL_CAPSULE | ORAL | Status: AC
  Administered 2024-11-19: 50000 [IU] via ORAL
  Filled 2024-11-19: qty 1

## 2024-11-19 MED ORDER — THIAMINE MONONITRATE 100 MG PO TABS
100.0000 mg | ORAL_TABLET | Freq: Every day | ORAL | Status: AC
  Administered 2024-11-19 – 2024-11-20 (×2): 100 mg via ORAL
  Filled 2024-11-19: qty 1

## 2024-11-19 MED ORDER — SODIUM CHLORIDE 0.9% IV SOLUTION: Freq: Once | INTRAVENOUS | Status: AC

## 2024-11-19 MED ORDER — DELFLEX-LC/1.5% DEXTROSE 344 MOSM/L IP SOLN: INTRAPERITONEAL | Status: DC

## 2024-11-19 MED ORDER — DELFLEX-LC/2.5% DEXTROSE 394 MOSM/L IP SOLN: INTRAPERITONEAL | Status: DC

## 2024-11-19 MED ORDER — LOPERAMIDE HCL 2 MG PO CAPS: 2.0000 mg | ORAL_CAPSULE | ORAL | Status: DC | PRN

## 2024-11-19 NOTE — Progress Notes (Addendum)
 PHARMACY - ANTICOAGULATION CONSULT NOTE  Pharmacy Consult for heparin  Indication: DVT  Allergies[1]  Patient Measurements: Height: 5' 5 (165.1 cm) Weight: 78 kg (171 lb 15.3 oz) IBW/kg (Calculated) : 57 HEPARIN  DW (KG): 72.6  Vital Signs: Temp: 98 F (36.7 C) (02/03 0757) Temp Source: Oral (02/03 0757) BP: 112/63 (02/03 0757) Pulse Rate: 92 (02/03 0757)  Labs: Recent Labs    11/17/24 0155 11/17/24 1945 11/18/24 0148 11/18/24 1941 11/19/24 0705  HGB 8.6*  --   --   --  7.4*  HCT 24.1*  --   --   --  20.5*  PLT 151  --   --   --  111*  APTT  --   --   --  39*  --   LABPROT 14.6  --   --   --   --   INR 1.1  --   --   --   --   HEPARINUNFRC  --   --   --   --  0.71*  CREATININE 8.30* 8.63* 8.01*  --   --     Estimated Creatinine Clearance: 8.1 mL/min (A) (by C-G formula based on SCr of 8.01 mg/dL (H)).   Assessment: [name] is a 59 y.o. female with a PMH notable for HIV, ESRD on PD, aortic aneurysm, aortic dissection s/p repair, anemia, breast cancer.  Venous duplex done this afternoon consistent with acute deep vein thrombosis acute superficial vein thrombosis. Pharmacy consulted to start heparin  infusion.  Not on anticoagulation prior to admission. Last dose of subQ heparin  was 2/1 @ 1712  Heparin  level is above goal at 0.71 on 1250 units/hr. No overt bleeding noted, Hgb down to 7.4, platelets down to 111 - continue to monitor CBC. No infusion or bleeding issues noted by RN.  Goal of Therapy:  Heparin  level 0.3-0.7 units/ml Monitor platelets by anticoagulation protocol: Yes   Plan:  Decrease heparin  infusion to 1150 units/hr 8 hr heparin  level after rate change Daily heparin  level and CBC Monitor for signs/symptoms of bleeding  Thank you for involving pharmacy in this patient's care.  Delon Sax, PharmD, BCPS Clinical Pharmacist Clinical phone for 11/19/2024 is [phone] 11/19/2024 8:00 AM       [1]  Allergies Allergen Reactions    Genvoya [Elviteg-Cobic-Emtricit-Tenofaf] Hives   Lisinopril Cough   Aldactone  [Spironolactone ] Hives   Valium  [Diazepam ] Other (See Comments)    Lethargy (pt states she cannot take this again)   Tegaderm Ag Mesh [Silver] Itching

## 2024-11-19 NOTE — Progress Notes (Signed)
 PHARMACY - ANTICOAGULATION CONSULT NOTE  Pharmacy Consult for heparin  Indication: DVT  Allergies[1]  Patient Measurements: Height: 5' 5 (165.1 cm) Weight: 78 kg (171 lb 15.3 oz) IBW/kg (Calculated) : 57 HEPARIN  DW (KG): 72.6  Vital Signs: Temp: 97.6 F (36.4 C) (02/03 1544) Temp Source: Oral (02/03 1544) BP: 115/64 (02/03 1544) Pulse Rate: 97 (02/03 1544)  Labs: Recent Labs    11/17/24 0155 11/17/24 1945 11/18/24 0148 11/18/24 1941 11/19/24 0705 11/19/24 1431 11/19/24 1832  HGB 8.6*  --   --   --  7.4* 7.0*  --   HCT 24.1*  --   --   --  20.5* 20.2*  --   PLT 151  --   --   --  111*  --   --   APTT  --   --   --  39*  --   --   --   LABPROT 14.6  --   --   --   --   --   --   INR 1.1  --   --   --   --   --   --   HEPARINUNFRC  --   --   --   --  0.71*  --  0.44  CREATININE 8.30* 8.63* 8.01*  --  7.59*  --   --     Estimated Creatinine Clearance: 8.5 mL/min (A) (by C-G formula based on SCr of 7.59 mg/dL (H)).   Assessment: [name] is a 59 y.o. female with a PMH notable for HIV, ESRD on PD, aortic aneurysm, aortic dissection s/p repair, anemia, breast cancer.  Venous duplex done this afternoon consistent with LLE acute deep vein thrombosis and other acute superficial vein thromboses. No anticoagulation prior to admission. Pharmacy consulted for heparin .    Heparin  level 0.44 is therapeutic. Level was drawn 45 min after stopping heparin  1150 units/hr at 17:45 for gum bleeding. Daughter reported patient had gum bleeding earlier when brushing teeth but didn't tell the RN until later. RN can see clots between teeth. Repeat H/H down. Spoke with TR[facility], who stated to hold heparin  until morning assessment.  Goal of Therapy:  Heparin  level 0.3-0.7 units/ml Monitor platelets by anticoagulation protocol: Yes   Plan:  Stop heparin  infusion  F/u anticoagulation plan   Thank you for involving pharmacy in this patient's care.  Jinnie Door, PharmD, BCPS,  BCCP Clinical Pharmacist  Please check AMION for all [facility] Pharmacy phone numbers After 10:00 PM, call Main Pharmacy [phone]     [1]  Allergies Allergen Reactions   Genvoya [Elviteg-Cobic-Emtricit-Tenofaf] Hives   Lisinopril Cough   Aldactone  [Spironolactone ] Hives   Valium  [Diazepam ] Other (See Comments)    Lethargy (pt states she cannot take this again)   Tegaderm Ag Mesh [Silver] Itching

## 2024-11-19 NOTE — Plan of Care (Signed)

## 2024-11-19 NOTE — TOC Progression Note (Addendum)
 Transition of Care [facility]) - Progression Note    Patient Details  Name: [name] MRN: [id number] Date of Birth: 06-20-1966  [facility] CM/SW Contact  Waddell Barnie Rama, RN Phone Number: 11/19/2024, 3:19 PM  Clinical Narrative:    NCM spoke with patient and her sister at the bedside,  patient lives alone, she states she is set up with [facility]  for [facility], HHPT, HHOT and [facility] infusion therapy for her cortrak tube feeds. She does PD at home, per her sister they give her heparin  everday with her diaylsis at home.  She gets tube feeds intemittently from 1800 to 10 am. She also have a DVT in her Left arm, on hep drip.  Her sister will be transporting her home at discharge .  NCM will confirm with [facility] infusion company and [facility].    Per nutrition note PEG tube may be contraindicated with use of peritoneal dialysis. Patient states she is to be on tube feeds for 6 months.  Previously was looking at [facility] but she would have to switch to HD instead of PD, which she does not want to do.    This NCM contacted Cory with [facility], he states he will have to check with the office and get back with me.  This NCM also contacted [name] field seismologist) at [facility] infusion [phone], left vm for a return call regarding continuation of care for patient's tube feeds.   Per [facility], [facility], [facility] phone is [phone], fax [phone]  at [facility] infusion patient was already active with [facility] prior to [facility] admit to [facility] for medication management , HHPT, HHOT.  [facility] infusion  supplies patient with the tube [facility]  16ml/hr over 16 hrs daily.  Patient was taught how to do the tube feedings, and told if she has any questions she can call [facility].  [name] said since she is back in the hospital whenevere she goes home, they can send a Nurse from [place] to to do a in home teaching to reinforce education with the pump.  This NCM informed her yes that would be a good  idea.     Expected Discharge Plan:  (TBD) Barriers to Discharge: Continued Medical Work up               Expected Discharge Plan and Services In-house Referral: Clinical Social Work Discharge Planning Services: CM Consult   Living arrangements for the past 2 months: Single Family Home                                       Social Drivers of Health (SDOH) Interventions SDOH Screenings   Food Insecurity: No Food Insecurity (11/19/2024)  Housing: Low Risk (11/19/2024)  Transportation Needs: No Transportation Needs (11/19/2024)  Utilities: Not At Risk (11/19/2024)  Alcohol  Screen: Low Risk (06/27/2024)  Depression (PHQ2-9): Medium Risk (10/23/2024)  Financial Resource Strain: Low Risk  (11/15/2024)   Received from [facility]  Physical Activity: Inactive (06/27/2024)  Social Connections: Unknown (06/27/2024)  Stress: Stress Concern Present (06/27/2024)  Tobacco Use: Medium Risk (10/23/2024)  Health Literacy: Adequate Health Literacy (06/27/2024)    Readmission Risk Interventions    09/27/2024    3:45 PM 09/12/2024    9:43 AM 02/21/2023    4:05 PM  Readmission Risk Prevention Plan  Transportation Screening Complete Complete Complete  PCP or Specialist Appt within 3-5 Days  Complete   HRI or Home Care Consult  Complete   Social Work Consult for Recovery Care Planning/Counseling  Complete   Palliative Care Screening  Not Applicable   Medication Review Oceanographer) Complete Referral to Pharmacy Complete  PCP or Specialist appointment within 3-5 days of discharge Complete  Complete  HRI or Home Care Consult Complete  Complete  SW Recovery Care/Counseling Consult Complete  Complete  Palliative Care Screening Not Applicable  Not Applicable  Skilled Nursing Facility Not Applicable  Not Applicable

## 2024-11-19 NOTE — Progress Notes (Signed)
 [facility] Progress Note  Subjective:  Seen in room No c/o's today   TF's running   Presentation summary: 59 y.o. 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  Started on tube feeds, phos binders were held, blood cultures all negative.  GI consulted and plan for outpatient manometry.  Patient also with questionable exit site infection of the PD and completed 10-day course of Keflex  with no signs of infection in the peritoneal fluid.  P/W weakness and fatigue with blood pressures that were on the lower side in the emergency department with a BUN/creatinine 103/9.69 elevated alkaline phosphatase, potassium 5.8 treated medically in the ED.   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 Her baseline creatinine is 12.5 and her usual BUN is 25-40.  Her KT/V had been 1.7. The last one was low at 1.4-1.5, they are not sure if this result was good because her sister did it this time (her 1st time), and also they had a new contract with [facility] are there were issues happening at that time.   Vitals:   11/18/24 2355 11/19/24 0342 11/19/24 0757 11/19/24 1155  BP: 135/60 (!) 114/48 112/63 115/64  Pulse:  90 92 92  Resp:  18 16 (!) 21  Temp: 98.3 F (36.8 C) 97.6 F (36.4 C) 98 F (36.7 C) 97.8 F (36.6 C)   TempSrc: Oral Oral Oral Oral  SpO2:  93% 95% 98%  Weight:  78 kg    Height:        Exam:  alert, nad   no jvd  Chest cta bilat  Cor reg no RG  Abd soft ntnd no ascites   Ext no LE edema   Alert, NF, ox3   PD cath mid-abdomen intact  BP meds at home:  None     OP PD: Dr Norine Flint  5 cycles, 2.25 fill vol, 2.5% dianeal , 1.5h dwell    Assessment/ Plan: # ESRD on PD - workup at [facility] was negative for infection - cont PD again tonight  # volume - no sig vol excess on exam - cont  mix of 1.5% and 2.5% fluids   # BP - bp's stable in the 120/ 60 range  - not on midodrine  or any bp lowering meds  # hx of pituitary apoplexy - w/ 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 on hydrocortisone  - planned future stim test to evaluate the HPA axis  # Hypothyroidism - on Synthroid   # Severe malnutrition - started on tube feeds at [facility]   # diabetes type 2   # 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   No results for input(s): IRON , TIBC, FERRITIN in the last 168 hours. Inpatient medications:  allopurinol   100 mg Oral Daily   aspirin   81 mg Oral Daily   busPIRone   10 mg Oral BID   dolutegravir   50 mg Oral Daily   emtricitabine -tenofovir  AF  1 tablet Oral Daily   feeding supplement (NEPRO CARB STEADY)  1,000 mL Per Tube q1800   feeding supplement (PROSource TF20)  60 mL Per Tube Daily   gentamicin  cream  1 Application Topical Daily   hydrocerin   Topical BID   hydrocortisone   15 mg Oral Q0600   And   hydrocortisone   5 mg Oral Q1400   insulin  aspart  0-6 Units Subcutaneous Q4H    levothyroxine   125 mcg Oral Q0600   mirtazapine   15 mg Oral QHS   multivitamin  1 tablet Oral QHS   pantoprazole   40 mg Oral Daily   rosuvastatin   10 mg Oral Daily   sodium chloride  flush  3 mL Intravenous Q12H   thiamine   100 mg Oral Daily    dialysis solution 1.5% low-MG/low-CA     dialysis solution 2.5% low-MG/low-CA     heparin  1,150 Units/hr (11/19/24 0913)   acetaminophen  **OR** acetaminophen , albuterol , gentamicin  cream, loperamide , ondansetron  (ZOFRAN ) IV, polyethylene glycol, traZODone , trimethobenzamide 

## 2024-11-20 ENCOUNTER — Other Ambulatory Visit (HOSPITAL_COMMUNITY): Payer: Self-pay

## 2024-11-20 ENCOUNTER — Telehealth (HOSPITAL_COMMUNITY): Payer: Self-pay | Admitting: Pharmacy Technician

## 2024-11-20 DIAGNOSIS — E559 Vitamin D deficiency, unspecified: Secondary | ICD-10-CM | POA: Diagnosis not present

## 2024-11-20 DIAGNOSIS — R1314 Dysphagia, pharyngoesophageal phase: Secondary | ICD-10-CM | POA: Diagnosis not present

## 2024-11-20 DIAGNOSIS — N2581 Secondary hyperparathyroidism of renal origin: Secondary | ICD-10-CM

## 2024-11-20 DIAGNOSIS — N186 End stage renal disease: Secondary | ICD-10-CM | POA: Diagnosis not present

## 2024-11-20 DIAGNOSIS — R7401 Elevation of levels of liver transaminase levels: Secondary | ICD-10-CM | POA: Diagnosis not present

## 2024-11-20 DIAGNOSIS — R748 Abnormal levels of other serum enzymes: Secondary | ICD-10-CM | POA: Diagnosis not present

## 2024-11-20 DIAGNOSIS — Z992 Dependence on renal dialysis: Secondary | ICD-10-CM | POA: Diagnosis not present

## 2024-11-20 DIAGNOSIS — E875 Hyperkalemia: Secondary | ICD-10-CM | POA: Diagnosis not present

## 2024-11-20 LAB — GLUCOSE, CAPILLARY
Glucose-Capillary: 110 mg/dL — ABNORMAL HIGH (ref 70–99)
Glucose-Capillary: 117 mg/dL — ABNORMAL HIGH (ref 70–99)
Glucose-Capillary: 131 mg/dL — ABNORMAL HIGH (ref 70–99)
Glucose-Capillary: 98 mg/dL (ref 70–99)
Glucose-Capillary: 138 mg/dL — ABNORMAL HIGH (ref 70–99)

## 2024-11-20 LAB — TYPE AND SCREEN
ABO/RH(D): O POS
Antibody Screen: NEGATIVE
Unit division: 0

## 2024-11-20 LAB — CBC
HCT: 27.5 % — ABNORMAL LOW (ref 36.0–46.0)
Hemoglobin: 9.5 g/dL — ABNORMAL LOW (ref 12.0–15.0)
MCH: 33.5 pg (ref 26.0–34.0)
MCHC: 34.5 g/dL (ref 30.0–36.0)
MCV: 96.8 fL (ref 80.0–100.0)
Platelets: 123 10*3/uL — ABNORMAL LOW (ref 150–400)
RBC: 2.84 MIL/uL — ABNORMAL LOW (ref 3.87–5.11)
RDW: 18.2 % — ABNORMAL HIGH (ref 11.5–15.5)
WBC: 11.5 10*3/uL — ABNORMAL HIGH (ref 4.0–10.5)
nRBC: 3.4 % — ABNORMAL HIGH (ref 0.0–0.2)

## 2024-11-20 LAB — HAPTOGLOBIN: Haptoglobin: 231 mg/dL (ref 33–346)

## 2024-11-20 LAB — BPAM RBC
Blood Product Expiration Date: 202602232359
ISSUE DATE / TIME: 202602032349
Unit Type and Rh: 5100

## 2024-11-20 MED ORDER — APIXABAN 5 MG PO TABS: 5.0000 mg | ORAL_TABLET | Freq: Two times a day (BID) | ORAL | Status: DC

## 2024-11-20 MED ORDER — APIXABAN 5 MG PO TABS
10.0000 mg | ORAL_TABLET | Freq: Two times a day (BID) | ORAL | Status: DC
  Administered 2024-11-20 (×2): 10 mg via ORAL
  Filled 2024-11-20 (×2): qty 2

## 2024-11-20 MED ORDER — DELFLEX-LC/2.5% DEXTROSE 394 MOSM/L IP SOLN: INTRAPERITONEAL | Status: DC

## 2024-11-20 MED ORDER — NEPRO/CARBSTEADY PO LIQD
1000.0000 mL | ORAL | Status: AC
  Administered 2024-11-20 – 2024-11-22 (×3): 1000 mL
  Filled 2024-11-20 (×3): qty 1000

## 2024-11-20 NOTE — Telephone Encounter (Signed)
 Patient Product/process Development Scientist completed.    The patient is insured through [employer]. Patient has Medicare and is not eligible for a copay card, but may be able to apply for patient assistance or Medicare RX Payment Plan (Patient Must reach out to their plan, if eligible for payment plan), if available.    Ran test claim for Eliquis  5 mg and the current 30 day co-pay is $12.65.   This test claim was processed through [facility]- copay amounts may vary at other pharmacies due to pharmacy/plan contracts, or as the patient moves through the different stages of their insurance plan.     Reyes Sharps, CPHT Pharmacy Technician Patient Advocate Specialist Lead [name] Patient Advocate Team Direct Number: [phone]  Fax: [phone]

## 2024-11-20 NOTE — Progress Notes (Signed)
 Physical Therapy Treatment Patient Details Name: [name] MRN: [id number] DOB: 1965/12/14 Today's Date: 11/20/2024   History of Present Illness Patient is 59 yo female admitted on 11/15/24 for presented secondary to weakness and fatigue, found to have hyperkalemia with mild hyponatremia. A DVT in the L UE was found on 2/2. PMH: ESRD on PD, anemia, anxiety, asthma, T2DM, aortic dissection s/p endovascular repair, GERD, Grave's disease, HTN, HLD, papillary thyroid  cancer s/p thyroidectomy (2018) PVD, falls, sarcoidosis, suppurative hydradenitis    PT Comments  This session was limited due to pt fatigue and limited activity tolerance, and anxiety was a prime factor in limiting her progression. Currently, pt is mod A with bed mobility needing hand held assist to pull up to go from supine to sitting at EOB. Pt was unable to perform a sit to stand due to fatigue and limited activity tolerance. Therapist provided active listening and collaboration of patient care but pt still continuously refused to stand despite various methods for easier assistance with transfers such as using the RW initially to then using the stedy. Pt still continues to be limited by activity tolerance, endurance, mobility, balance, strength and ROM. Though pt has declined inpatient rehab due to preference of dialysis treatment, a barrier to [facility] PT is pt has not been able to stand up from EOB and progress towards ambulation. Pt has support at home intermittently and has stated that family is working on providing 24/7 care but that has not been established yet. Pt will likely benefit from inpatient rehab <3hrs/day to d/c home.   If plan is discharge home, recommend the following: A lot of help with walking and/or transfers;A lot of help with bathing/dressing/bathroom;Assistance with cooking/housework;Assist for transportation;Help with stairs or ramp for entrance   Can travel by private vehicle     No  Equipment  Recommendations  BSC/3in1;Hospital bed;Wheelchair (measurements PT);Wheelchair cushion (measurements PT)    Recommendations for Other Services       Precautions / Restrictions Precautions Precautions: Fall Recall of Precautions/Restrictions: Intact Restrictions Weight Bearing Restrictions Per Provider Order: No     Mobility  Bed Mobility Overal bed mobility: Needs Assistance Bed Mobility: Supine to Sit, Sit to Supine     Supine to sit: Mod assist, HOB elevated, Used rails Sit to supine: Mod assist, HOB elevated, Used rails   General bed mobility comments: Pt was mod A for supine to sit needing hand held assist to pull up to come to EOB. Pt was mod A for sit to supine needing assistance at the bilateral lower extremity to get to EOB.    Transfers Overall transfer level: Needs assistance Equipment used: Rolling walker (2 wheels), Ambulation equipment used Transfers: Sit to/from Stand Sit to Stand: Max assist, +2 physical assistance, Via lift equipment           General transfer comment: Initially, therapist tried to stand pt max A X2 with the RW but pt unable to come to a full stand and needed to sit back down on the bed. Therapist then attempted to perform a sit to stand with the stedy but pt refusing to stand up using the stedy. Transfer via Lift Equipment: Stedy  Ambulation/Gait               General Gait Details: Unable to at this time.   Stairs             Wheelchair Mobility     Tilt Bed    Modified Rankin (Stroke Patients Only)  Balance Overall balance assessment: Needs assistance Sitting-balance support: Feet supported, No upper extremity supported Sitting balance-Leahy Scale: Fair Sitting balance - Comments: Pt able to sit at EOB without loss of balance. Pt intermittently needing CGA at EOB to prevent pt from prematurely laying down.   Standing balance support: Bilateral upper extremity supported, During functional activity,  Reliant on assistive device for balance Standing balance-Leahy Scale: Zero Standing balance comment: Unable to come to a full stand at this time.                            Communication Communication Communication: No apparent difficulties  Cognition Arousal: Alert Behavior During Therapy: Agitated, Anxious, Impulsive   PT - Cognitive impairments: Safety/Judgement, Awareness                       PT - Cognition Comments: Pt was agitated at EOB and was refusing to attempt out of bed mobility after initial attempt. Pt very limited by her anxiety and fear of falling. Pt had got upset at times and would get frustrated and impulsively lay back on to bed with unawareness of safety and state yall just gonna let me fall back and not help me Following commands: Intact      Cueing Cueing Techniques: Verbal cues, Tactile cues, Visual cues  Exercises General Exercises - Lower Extremity Hip Flexion/Marching: Both, Strengthening, 10 reps, Seated, AROM, AAROM (Pt was refusing to do at first and wanted to get back in bed but pt was able to perform exercise with tactile cueing and visual cues for demonstration. L lower extremity needing assistance for 10 reps.)    General Comments General comments (skin integrity, edema, etc.): SpO2 was stable on 2L of O2 with the lowest O2% of 96%. Pt complained of dizziness at EOB. ECG showed ST dropped when getting to EOB initally but was stable after. BP was taken and was 126/79. Pt was unreceptive to standing despite active listening and collaboration of pt goals and needs for when pt is at home. Therapist used family encouragement and nursing staff but pt still continued to refuse. Therapist talked to sister about pt care on moms phone.      Pertinent Vitals/Pain Pain Assessment Pain Assessment: Faces Faces Pain Scale: Hurts even more Pain Location: generalized with movement Pain Descriptors / Indicators: Grimacing, Guarding, Tiring Pain  Intervention(s): Limited activity within patient's tolerance, Monitored during session    Home Living                          Prior Function            PT Goals (current goals can now be found in the care plan section) Acute Rehab PT Goals Patient Stated Goal: to go home PT Goal Formulation: With patient/family Time For Goal Achievement: 12/01/24 Potential to Achieve Goals: Fair Progress towards PT goals: Progressing toward goals    Frequency    Min 2X/week      PT Plan      Co-evaluation              AM-PAC PT 6 Clicks Mobility   Outcome Measure  Help needed turning from your back to your side while in a flat bed without using bedrails?: A Lot Help needed moving from lying on your back to sitting on the side of a flat bed without using bedrails?: A Lot Help needed moving  to and from a bed to a chair (including a wheelchair)?: Total Help needed standing up from a chair using your arms (e.g., wheelchair or bedside chair)?: Total Help needed to walk in hospital room?: Total Help needed climbing 3-5 steps with a railing? : Total 6 Click Score: 8    End of Session Equipment Utilized During Treatment: Gait belt;Oxygen  Activity Tolerance: Patient limited by fatigue;Treatment limited secondary to agitation Patient left: in bed;with call bell/phone within reach;with bed alarm set;with family/visitor present (mother in room)   PT Visit Diagnosis: Adult, failure to thrive (R62.7);Unsteadiness on feet (R26.81);Difficulty in walking, not elsewhere classified (R26.2);Other abnormalities of gait and mobility (R26.89);Muscle weakness (generalized) (M62.81);Repeated falls (R29.6)     Time: 8546-8455 PT Time Calculation (min) (ACUTE ONLY): 51 min  Charges:    $Therapeutic Exercise: 8-22 mins $Neuromuscular Re-education: 23-37 mins PT General Charges $$ ACUTE PT VISIT: 1 Visit                     [name], SPT    [name] 11/20/2024, 6:00  PM

## 2024-11-20 NOTE — Progress Notes (Signed)
 "       Patient Name: [name] Date of Encounter: 11/20/2024, 4:50 PM     Assessment and Plan  Markedly elevated alkaline phosphatase, mild transaminitis-cause not clear.  Possibilities include drug-induced liver injury but normal bilirubin goes against cholestatic injury, autoimmune phenomenon (mitochondrial antibodies, autoimmune studies drawn today).  Alkaline phosphatase was elevated last fall but has significantly increased.  Multiple CT scans and ultrasounds do not show ductal dilation.  Ultrasound shows suspected steatosis.  Alk phos is trending downward.  It does not need to be checked daily.  Serologic evaluation with anti-smooth muscle antibody, mitochondrial antibodies and anti-LK M- and ANA has been negative in the past.  CMV DNA negative.   She does have hyperparathyroidism and that may be contributing.  Her GGT is very elevated but I am not sure how much we can count on that as far as indicating the alkaline phosphatase elevation is only from the liver.  On October 31, 2024 PTH was 331, at [facility].  Vitamin D  was 11 indicating vitamin D  deficiency.  Phosphorus was 5.9. Discussed with Dr. Geralynn of renal and he will follow-up on this.   She could have multiple endocrine neoplasia syndrome with a history of thyroid  cancer and a pituitary tumor, but I would think she did have hypercalcemia if she had hyperparathyroidism from malignancy.   Chronic dysphagia of unclear etiology.  She reports that she cannot get it down and from what I hear describing something that sounds like transfer dysphagia.  Sister participates in the history and indicates the patient cannot swallow solids without drinking liquid.  As reflected in notes from [facility] and here below no clear cause.  [facility] suggested a possible esophageal manometry.  I think that is probably low yield and at this point not something we can do while she is an inpatient at [facility].  Typically an outpatient study at [facility]  with limited availability.     Multiple serious comorbidities-ESRD on peritoneal dialysis, severe cardiomyopathy, HIV, sarcoidosis, pituitary apoplexy and adrenal insufficiency  --------------------------------------------------------------------------------------------------------------------------------------  Continue supportive care   Given hyperparathyroidism with markedly elevated PTH level and vitamin D  deficiency I wonder if there is at least a contribution from this (increased bone turnover) if not the undrerlying cause of elevated alkaline phosphatase as opposed to a liver problem.  As above per discussion with Dr. Geralynn.  Preliminary discussion with him per secure chat.  Sounds like that PTH level was not that high for ESRD patients.  Calcium  is normal or close to it.  I have started vitamin D  50,000 units weekly.  PTH level pending  Speech pathology has not seen her yet.  I do not have much more to contribute at this point from a gastroenterology perspective so I am signing off.  I doubt manometry would yield much information but could potentially be pursued as an outpatient but she is so weak I am not sure how well she could cooperate with that.  When she is ready to go home you can ask us  about follow-up.  Felicia CHARLENA Commander, MD, [facility] See AMION on call - gastroenterology for best contact person 11/20/2024 4:50 PM      .   History from recent [facility] admission  Pt presented w/ chronic dysphagia in her oropharynx to solids/liquids over the last 3 months. During a recent admission, she had a barium esophagram which showed dysmotility prompting an EGD 09/11/24 that revealed a normal-appearing esophagus, she was empirically dilated to  20 mm balloon the remainder of the study was unremarkable. She did have biopsies taken of the stomach for mild antral erythema. Gastric biopsies negative for H. pylori and showed mild inactive gastritis. However, upon  further chart review, it appears that this dysphagia has been going on for many years, dating back to her thyroidectomy in 2009   December 2025 [facility] health speech-language pathology evaluation Assessment / Plan / Recommendation  Clinical Impression   SLP consulted primarily due to limited intake rather than concern for dysphagia. Note, RD saw pt earlier this date and liberalized her diet from renal restrictions to allow more variety. Observed pt feed herself regular solids, achieving complete oral clearance. No signs clinically concerning for aspiration were observed with thin liquids. Continue current diet without ongoing SLP f/u. SLP Visit Diagnosis: Dysphagia, unspecified (R13.10)   SLP note [facility] 11/13/24 Of note, per Gastroenterology's Consult note on 11/08/24, it reports effortful eating including with transfer of foods and suggests VFSS to evaluate. Given patient is able to demonstrate good oral transfer of food at bedside (both today and during 10/30/24 initial evaluation), this effortful eating described does not appear to be mechanical in nature. When asked, patient describes the effort is due to (a) her odynophagia and the anticipatory resistance knowing it will be painful to swallow and (b) the sensation of feeling things stuck in her throat, similar to how she felt the barium tablet stuck at the GE junction during 11/06/24 Barium Swallow Study. These reports further lend themselves to suggest esophageal dysmotility as the primary cause of patient's symptoms.    Given the above, SLP to sign off. Please re-consult if appropriate.   11/08/24 [facility] GI consult   [name] had a evaluation for dysphagia which has included speech therapy evaluation, multiple barium swallows, upper endoscopy, and cross-sectional imaging. She is noted to have esophageal dysmotilty and delayed passage of barium tablet for which she underwent an EGD. This revealed a normal esophagus that was empirically dilated and  otherwise unremarkable. She has had an extensive evaluation for GI related causes of Nausea, vomiting and weight loss. Which has only yielded evidence of mild dysmotility. This can be evaluated further as an outpatient. At this time I would recommend expanding evaluation to assess for non-GI related pathology including continued endocrinopathy evaluation/management,  optimization of electrolytes, treatment of PD catheter site pathology, optimization of her NICM and HFrEF. She also has polypharmacy, so would see if any medications could be de-escalated or discontinued to assess if this could help with her symptoms.    Recommendations: - Recommend VFSS inpatient and esophageal manometry as an outpatient. - We do not recommend an EGD for further evaluation at this time after reviewing November 11/26 EGD.  - Recommend assessing other etiologies that may be contributing to chronic nausea and vomiting   This plan was discussed with the supervising GI attending physician: Dr. Louann. Please see the attending attestation/separate note for additional information and any updated recommendations.   Thank you for this consult. We will sign off at this time.     MANCEL MARION, MD Gastroenterology Fellow   If you have any questions or concerns, please contact the covering GI fellow at the functional pager listed below: GI General Consult Pager: [phone]    ------------------------------------------------------------------------------- Attestation signed by Louann Alm Hora, MD at 11/08/2024 10:42 PM Attestation Statement:    I personally saw and evaluated the patient, and participated in the management and treatment plan as documented in the resident/fellow note.  DAVID VERIA BARCELONA, MD   This is a 59 yo woman with an extensive past medical history including ESRD on PD, aortic dissection and pituitary apoplexy here with dysphagia and weight loss. She has had recent evaluations for this at OSH,  including EGD with empiric dilation. Per patient, there was no improvement with this. Also with BaS that did not reveal an etiology. She does describe effortful eating including with transfer of foods. In this context, and given her established esophageal work-up so far, recommend formal VFSS with speech. Given lack of benefit with recent EGD and dilation, this is less likely to identify an etiology but could be considered depending on above with intention of esophageal biopsies. No plans for manometry at this time though this could be done as an outpatient. GI will sign off. :Please call back with questions.  -----------------------------------------------------------------------------------------------------------------------    Subjective  Still feels weak and about the same today.  Said she could not do more than sit on the edge of the bed and feels like PT thought she was not trying.  She is upset about the potential for discharge to home.  Objective  BP 115/60 (BP Location: Right Arm)   Pulse (!) 110   Temp 98 F (36.7 C) (Oral)   Resp 20   Ht 5' 5 (1.651 m)   Wt 78 kg   LMP 03/31/2014   SpO2 97%   BMI 28.62 kg/m  Lying in bed, no acute distress Nasoenteric tube in place left nare  Recent Labs  Lab 11/15/24 1225 11/16/24 0258 11/17/24 0155 11/18/24 0148 11/19/24 0705  AST 103* 86* 82* 70* 87*  ALT 78* 65* 64* 59* 61*  ALKPHOS 1,218* 1,114* 1,196* 1,092* 1,078*  BILITOT 0.8 0.7 0.6 0.5 0.5  PROT 6.3* 5.9* 6.6 6.3* 6.1*  ALBUMIN  2.3* 2.0* 2.3* 2.3* 2.3*  INR  --   --  1.1  --   --    Lab Results  Component Value Date   INR 1.1 11/17/2024   INR 1.1 09/23/2024   INR 1.4 (H) 08/29/2023    Recent Labs  Lab 11/15/24 1225 11/16/24 0258 11/17/24 0155 11/17/24 1945 11/18/24 0148 11/19/24 0705  NA 131* 130* 124* 125* 128* 128*  K 5.8* 4.5 4.8 4.9 4.4 4.1  CL 88* 87* 82* 84* 84* 85*  CO2 23 24 22 22 23 25   GLUCOSE 49* 91 218* 96 135* 78  BUN 103* 96* 92* 105* 101*  107*  CREATININE 9.69* 9.10* 8.30* 8.63* 8.01* 7.59*  CALCIUM  8.9 8.7* 8.6* 8.6* 8.6* 8.9  MG 2.1 2.0  --   --   --   --   PHOS  --  3.8  --   --   --   --       Latest Ref Rng & Units 11/20/2024    3:30 AM 11/19/2024    6:32 PM 11/19/2024    2:31 PM  CBC  WBC 4.0 - 10.5 K/uL 11.5     Hemoglobin 12.0 - 15.0 g/dL 9.5  6.5  7.0   Hematocrit 36.0 - 46.0 % 27.5  18.7  20.2   Platelets 150 - 400 K/uL 123       Hepatitis B surface antigen nonreactive Hepatitis A antibody IgM nonreactive HCV antibody nonreactive      Felicia CHARLENA Commander, MD, [facility] See TRACEY on call - gastroenterology for best contact person 11/20/2024 4:50 PM   "

## 2024-11-20 NOTE — Discharge Instructions (Addendum)
 Information on my medicine - ELIQUIS  (apixaban )  This medication education was reviewed with me or my healthcare representative as part of my discharge preparation.    Why was Eliquis  prescribed for you? Eliquis  was prescribed to treat blood clots that may have been found in the veins of your legs (deep vein thrombosis) or in your lungs (pulmonary embolism) and to reduce the risk of them occurring again.  What do You need to know about Eliquis  ? The starting dose is 10 mg (two 5 mg tablets) taken TWICE daily for the FIRST SEVEN (7) DAYS, then on  11/27/2024  the dose is reduced to ONE 5 mg tablet taken TWICE daily.  Eliquis  may be taken with or without food.   Try to take the dose about the same time in the morning and in the evening. If you have difficulty swallowing the tablet whole please discuss with your pharmacist how to take the medication safely.  Take Eliquis  exactly as prescribed and DO NOT stop taking Eliquis  without talking to the doctor who prescribed the medication.  Stopping may increase your risk of developing a new blood clot.  Refill your prescription before you run out.  After discharge, you should have regular check-up appointments with your healthcare provider that is prescribing your Eliquis .    What do you do if you miss a dose? If a dose of ELIQUIS  is not taken at the scheduled time, take it as soon as possible on the same day and twice-daily administration should be resumed. The dose should not be doubled to make up for a missed dose.  Important Safety Information A possible side effect of Eliquis  is bleeding. You should call your healthcare provider right away if you experience any of the following: Bleeding from an injury or your nose that does not stop. Unusual colored urine (red or dark brown) or unusual colored stools (red or black). Unusual bruising for unknown reasons. A serious fall or if you hit your head (even if there is no bleeding).  Some  medicines may interact with Eliquis  and might increase your risk of bleeding or clotting while on Eliquis . To help avoid this, consult your healthcare provider or pharmacist prior to using any new prescription or non-prescription medications, including herbals, vitamins, non-steroidal anti-inflammatory drugs (NSAIDs) and supplements.  This website has more information on Eliquis  (apixaban ): [email or web address] .[email or web address] dena

## 2024-11-20 NOTE — Progress Notes (Signed)
 PD Note  11/20/24 1002  Peritoneal Catheter Mid lower abdomen  Placement Date/Time: 01/10/24 1213   Serial / Lot #: [id number]  Expiration Date: 04/15/28  Procedural Verification: Medical records & consent reviewed;Relevant studies,results and images reviewed;Site marked with initials  Time out: Correct Patient;Corre...  Dressing Gauze/Drain sponge  Dressing Status Clean, Dry, Intact  Completion  Treatment Status Complete  Initial Drain Volume 74  Average Dwell Time-Hour(s) 1  Average Dwell Time-Min(s) 30  Average Drain Time 24  Total Therapy Volume 88749  Total Therapy Time-Hour(s) 10  Total Therapy Time-Min(s) 35  Effluent Appearance Clear;Amber  Fluid Balance - CCPD  Total UF (+ value on cycler, pt loss) 88 mL  Procedure Comments  Tolerated treatment well? Yes  Peritoneal Dialysis Comments Patient stated she had no difficulties with treatment.   Patient alert and oriented.  See flowsheet for vitals.

## 2024-11-20 NOTE — TOC Progression Note (Signed)
 Transition of Care [facility]) - Progression Note    Patient Details  Name: [name] MRN: [id number] Date of Birth: Mar 16, 1966  [facility] CM/SW Contact  Waddell Barnie Rama, RN Phone Number: 11/20/2024, 2:28 PM  Clinical Narrative:    NCM received a call from [name] with [facility] stating that she will have a Nurse  with  [facility] ready on Thursday at 1 pm for patient to go over the teaching of the feeding pump at home.  [facility] states patient already has the supplies at home.  She is also set up with [facility] for HHPT, HHOT and HHRN for med management ( will not be doing cortrak care) .  NCM informed patient of this information, she states she told MD that she did not feel ready to go home yet.  She informed this NCM  that she has not been up out of bed yet, so she does not know what to expect of herself and that her  sister has to get her home ready. NCM called [facility] and left vm for return call.    Expected Discharge Plan:  (TBD) Barriers to Discharge: Continued Medical Work up               Expected Discharge Plan and Services In-house Referral: Clinical Social Work Discharge Planning Services: CM Consult   Living arrangements for the past 2 months: Single Family Home                                       Social Drivers of Health (SDOH) Interventions SDOH Screenings   Food Insecurity: No Food Insecurity (11/19/2024)  Housing: Low Risk (11/19/2024)  Transportation Needs: No Transportation Needs (11/19/2024)  Utilities: Not At Risk (11/19/2024)  Alcohol  Screen: Low Risk (06/27/2024)  Depression (PHQ2-9): Medium Risk (10/23/2024)  Financial Resource Strain: Low Risk  (11/15/2024)   Received from [facility]  Physical Activity: Inactive (06/27/2024)  Social Connections: Unknown (06/27/2024)  Stress: Stress Concern Present (06/27/2024)  Tobacco Use: Medium Risk (10/23/2024)  Health Literacy: Adequate Health Literacy (06/27/2024)     Readmission Risk Interventions    09/27/2024    3:45 PM 09/12/2024    9:43 AM 02/21/2023    4:05 PM  Readmission Risk Prevention Plan  Transportation Screening Complete Complete Complete  PCP or Specialist Appt within 3-5 Days  Complete   HRI or Home Care Consult  Complete   Social Work Consult for Recovery Care Planning/Counseling  Complete   Palliative Care Screening  Not Applicable   Medication Review [employer]) Complete Referral to Pharmacy Complete  PCP or Specialist appointment within 3-5 days of discharge Complete  Complete  HRI or Home Care Consult Complete  Complete  SW Recovery Care/Counseling Consult Complete  Complete  Palliative Care Screening Not Applicable  Not Applicable  Skilled Nursing Facility Not Applicable  Not Applicable

## 2024-11-20 NOTE — Progress Notes (Signed)
 [facility] Progress Note  Subjective:  Seen in room No c/o's today   Presentation summary: 59 y.o. 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  Started on tube feeds, phos binders were held, blood cultures all negative.  GI consulted and plan for outpatient manometry.  Patient also with questionable exit site infection of the PD and completed 10-day course of Keflex  with no signs of infection in the peritoneal fluid.  P/W weakness and fatigue with blood pressures that were on the lower side in the emergency department with a BUN/creatinine 103/9.69 elevated alkaline phosphatase, potassium 5.8 treated medically in the ED.   Per discussion 2/02 w/ PD staff in [place], per the clinic pt was very stable on PD for a good while, but since her pituitary apoplexy her manor/ attitude has changed for the worse. Her baseline creatinine is 12.5 and her usual BUN is 25-40.  Her KT/V had been 1.7. The last one was low at 1.4-1.5, they are not sure if this result was good because her sister did it this time (her 1st time), and also they had a new contract with [facility] are there were issues happening at that time.   Vitals:   11/20/24 0248 11/20/24 0339 11/20/24 0805 11/20/24 1120  BP:  124/68 123/62 115/60  Pulse:   90 (!) 110  Resp: 18 20 20 20   Temp:  98.5 F (36.9 C) 98.1 F (36.7 C) 98 F (36.7 C)  TempSrc:  Oral Oral Oral  SpO2:  96% 96%  97%  Weight:      Height:        Exam:  alert, nad , on RA  no jvd  Chest cta bilat  Cor reg no RG  Abd obese soft ntnd no ascites   Ext no LE edema   Alert, NF, ox3   PD cath mid-abdomen intact  BP meds at home:  None     OP PD: Dr Norine Flint  5 cycles, 2.25 fill vol, 2.5% dianeal , 1.5h dwell  74.5kg dry wt    Assessment/ Plan: # ESRD on PD - workup at [facility] was negative for infection - cont PD again tonight  # volume - no sig vol excess on exam - up 3kg by wts, use all 2.5% fluids w/ PD tonight   # BP - bp's stable in the 120/ 60 range  - not on midodrine  or any bp lowering meds  # hx of pituitary apoplexy - w/ 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 on hydrocortisone  - planned future stim test to evaluate the HPA axis  # Hypothyroidism - on Synthroid   # Severe malnutrition - started on tube feeds at [facility]   # diabetes type 2   # 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   No results for input(s): IRON , TIBC, FERRITIN in the last 168 hours. Inpatient medications:  allopurinol   100 mg Oral Daily   apixaban   10 mg Oral BID   Followed by   NOREEN ON 11/27/2024] apixaban   5 mg Oral BID   aspirin   81 mg Oral Daily   busPIRone   10 mg Oral BID   dolutegravir   50 mg Oral Daily   emtricitabine -tenofovir  AF  1 tablet Oral Daily   feeding supplement (NEPRO CARB STEADY)  1,000 mL Per Tube Q24H   feeding supplement (PROSource TF20)  60 mL Per Tube Daily   gentamicin  cream  1  Application Topical Daily   hydrocerin   Topical BID   hydrocortisone   15 mg Oral Q0600   And   hydrocortisone   5 mg Oral Q1400   insulin  aspart  0-6 Units Subcutaneous Q4H   levothyroxine   125 mcg Oral Q0600   mirtazapine   15 mg Oral QHS   multivitamin  1 tablet Oral QHS   pantoprazole   40 mg Oral Daily   rosuvastatin   10 mg Oral Daily   sodium chloride  flush  3 mL Intravenous Q12H   Vitamin D  (Ergocalciferol )  50,000 Units Oral Q7 days    dialysis solution 1.5% low-MG/low-CA     dialysis solution 2.5% low-MG/low-CA     acetaminophen  **OR** acetaminophen , albuterol , gentamicin  cream, loperamide , ondansetron  (ZOFRAN ) IV, polyethylene glycol, traZODone , trimethobenzamide 

## 2024-11-20 NOTE — Progress Notes (Signed)
 " PROGRESS NOTE    [name]  FMW:[id number] DOB: Mar 16, 1966 DOA: 11/15/2024 PCP: Gretta Comer POUR, NP   Brief Narrative:  [name] is a 59 y.o. female with a history of hypertension, hyperlipidemia, diabetes mellitus, hypothyroidism, HIV, ESRD on PD, chronic systolic heart failure, anxiety, depression, dysphagia, aortic aneurysm, aortic dissection s/p repair, pituitary apoplexy, adrenal insufficiency, sarcoid, anemia, breast cancer, gout, asthma.  Patient presented secondary to weakness and fatigue, found to have hyperkalemia with mild hyponatremia. During workup, patient was also found to have a significantly elevated alkaline phosphatase with elevated AST/ALT. GI consulted. Left upper arm DVT identified on 2/2; heparin  IV started.  Assessment & Plan:   Principal Problem:   Hyperkalemia Active Problems:   History of aortic dissection   Hypertension   HIV (human immunodeficiency virus infection) (HCC)   Type 2 diabetes mellitus with hyperlipidemia (HCC)   Hypothyroidism   Depression   Asthma   GAD (generalized anxiety disorder)   Hyperlipidemia   ESRD (end stage renal disease) (HCC)   Normocytic anemia   Pharyngoesophageal dysphagia   Chronic gout   H/O aortic aneurysm repair   Benign neoplasm of pituitary gland (HCC)   Abnormal esophagram   Hypoglycemia   HFrEF (heart failure with reduced ejection fraction) (HCC)   Pituitary apoplexy   History of breast cancer   LFT elevation   Cholestasis (HCC)   Elevated alkaline phosphatase level   Cholestatic hepatitis   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  - Hemoglobin downtrending, continues to require anticoagulation - Follow for sources of bleeding - repeat FOBT pending but previously positive in December (2 of 3 samples) - Transfused 1u PRBC 11/19/24 - Haptoglobin wnl, LDH elevated  in isolation, Tbili WNL  Hyperkalemia Likely secondary to ESRD. Resolved with with Lokelma  and dialysis.   ESRD on PD Nephrology consulted for peritoneal dialysis. Diet transitioned to regular diet (okay with nephrology)   Hyponatremia Mild. Management with peritoneal dialysis.   Diabetes mellitus type 2 Well controlled - most recent hemoglobin A1C of 5.6%.   Dysphagia Esophageal dysmotility Patient with workup at [facility]. Recommendation at that time was for discharge with an NG tube with home health services, in addition to outpatient follow-up for manometry. - Continue tube feeds - Encourage oral intake -- Dietician recommendations (2/2): Continue tube feeding via small bore feeding tube: Nepro Carb Steady at 60 ml/h x16 hours 6PM-10AM (960 ml per day) Prosource TF20 60 ml daily MONITOR NEED FOR FWF Provides 1779 kcal, 98 gm protein, 698 ml free water  daily    Elevated alkaline phosphatase Elevated AST/ALT Unclear etiology. GGT elevated, suggesting biliary source. RUQ ultrasound significant for cholelithiasis without evidence of cholecystitis; patient without pain. GI recommendation for observation/conservative management. - Follow-up labs within normal limits   Left upper extremity DVT Acute SVTs Likely provoked based on recent history, but unclear. DVT involves the left radial veins.   Pituitary apoplexy Adrenal insufficiency Patient started on stress dose steroids secondary to hypotension with improved blood pressure readings.  - Continue hydrocortisone  (15 mg q0600 and 5 mg q1400)   Hypotension, resolved Likely related to adrenal insufficiency.  Midodrine  discontinued. - Continue treatment of adrenal sufficiency as above -Continue to hold home metoprolol  and losartan    Hypothyroidism - S/p thyroidectomy. - Continue Synthroid    Chronic HFrEF LVEF of 20% on most recent Transthoracic Echocardiogram from admission at [facility].  Not able to tolerate goal directed medical  therapy secondary to hypotension. - Strict in/out - Metoprolol /losartan  on hold   HIV disease - Continue Descovy  and [name]    History of aortic dissection Aortic aneurysm S/p repair of dissection. - Continue aspirin    Nausea and vomiting Unclear if related to related to renal disease - Zofran  as needed   Asthma - Continue albuterol  as needed   Gout - Continue allopurinol    Depression Anxiety - Continue Remeron  and BuSpar    History of breast cancer Patient is s/p lumpectomy, chemotherapy and immunotherapy  DVT prophylaxis:    Code Status:   Code Status: Full Code  Family Communication: None present  Status is: Inpatient  Dispo: The patient is from: Home              Anticipated d/c is to: To be determined              Anticipated d/c date is: 24-hour              Patient currently is medically stable for discharge  Consultants:  PCCM, nephrology  Antimicrobials:  None  Subjective: No acute issues or events overnight, still having difficulty ambulating with physical therapy but otherwise denies nausea vomiting diarrhea constipation headache fevers chills or chest pain  Objective: Vitals:   11/20/24 0246 11/20/24 0247 11/20/24 0248 11/20/24 0339  BP:    124/68  Pulse:      Resp: 20 (!) 24 18 20   Temp:    98.5 F (36.9 C)  TempSrc:    Oral  SpO2:    96%  Weight:      Height:        Intake/Output Summary (Last 24 hours) at 11/20/2024 0734 Last data filed at 11/20/2024 0251 Gross per 24 hour  Intake 4052 ml  Output --  Net 4052 ml   Filed Weights   11/16/24 0500 11/17/24 0341 11/19/24 0342  Weight: 75.6 kg 75 kg 78 kg    Examination:  General:  Pleasantly resting in bed, No acute distress. HEENT: NG intact Neck:  Without mass or deformity.  Trachea is midline. Lungs:  Clear to auscultate bilaterally without rhonchi, wheeze, or rales. Heart:  Regular rate and rhythm.  Without murmurs, rubs, or gallops. Abdomen:  Soft, nontender, nondistended.   Without guarding or rebound. Extremities: Without cyanosis, clubbing, edema, or obvious deformity. Skin:  Warm and dry, no erythema.   Data Reviewed: I have personally reviewed following labs and imaging studies  CBC: Recent Labs  Lab 11/15/24 1225 11/16/24 0258 11/17/24 0155 11/19/24 0705 11/19/24 1431 11/19/24 1832 11/20/24 0330  WBC 13.1* 13.5* 17.7* 13.3*  --   --  11.5*  NEUTROABS 9.8*  --   --   --   --   --   --   HGB 9.3* 9.2* 8.6* 7.4* 7.0* 6.5* 9.5*  HCT 26.9* 25.7* 24.1* 20.5* 20.2* 18.7* 27.5*  MCV 101.9* 99.6 98.4 99.0  --   --  96.8  PLT 142* 138* 151 111*  --   --  123*   Basic Metabolic Panel: Recent Labs  Lab 11/15/24 1225 11/16/24 0258 11/17/24 0155 11/17/24 1945 11/18/24 0148 11/19/24 0705  NA 131* 130* 124* 125* 128* 128*  K 5.8* 4.5 4.8 4.9 4.4 4.1  CL 88* 87* 82* 84* 84* 85*  CO2 23 24 22 22 23 25   GLUCOSE 49* 91 218* 96 135* 78  BUN 103* 96* 92* 105* 101* 107*  CREATININE 9.69* 9.10* 8.30* 8.63*  8.01* 7.59*  CALCIUM  8.9 8.7* 8.6* 8.6* 8.6* 8.9  MG 2.1 2.0  --   --   --   --   PHOS  --  3.8  --   --   --   --    GFR: Estimated Creatinine Clearance: 8.5 mL/min (A) (by C-G formula based on SCr of 7.59 mg/dL (H)). Liver Function Tests: Recent Labs  Lab 11/15/24 1225 11/16/24 0258 11/17/24 0155 11/18/24 0148 11/19/24 0705  AST 103* 86* 82* 70* 87*  ALT 78* 65* 64* 59* 61*  ALKPHOS 1,218* 1,114* 1,196* 1,092* 1,078*  BILITOT 0.8 0.7 0.6 0.5 0.5  PROT 6.3* 5.9* 6.6 6.3* 6.1*  ALBUMIN  2.3* 2.0* 2.3* 2.3* 2.3*   Recent Labs  Lab 11/15/24 1225  LIPASE 21   No results for input(s): AMMONIA in the last 168 hours. Coagulation Profile: Recent Labs  Lab 11/17/24 0155  INR 1.1   Cardiac Enzymes: No results for input(s): CKTOTAL, CKMB, CKMBINDEX, TROPONINI in the last 168 hours. BNP (last 3 results) No results for input(s): PROBNP in the last 8760 hours. HbA1C: No results for input(s): HGBA1C in the last 72  hours. CBG: Recent Labs  Lab 11/19/24 1234 11/19/24 1542 11/19/24 2029 11/19/24 2308 11/20/24 0338  GLUCAP 85 95 73 83 110*   Lipid Profile: No results for input(s): CHOL, HDL, LDLCALC, TRIG, CHOLHDL, LDLDIRECT in the last 72 hours. Thyroid  Function Tests: Recent Labs    11/17/24 1945  FREET4 0.99   Anemia Panel: No results for input(s): VITAMINB12, FOLATE, FERRITIN, TIBC, IRON , RETICCTPCT in the last 72 hours. Sepsis Labs: No results for input(s): PROCALCITON, LATICACIDVEN in the last 168 hours.  Recent Results (from the past 240 hours)  C Difficile Quick Screen w PCR reflex     Status: None   Collection Time: 11/15/24  1:03 PM   Specimen: STOOL  Result Value Ref Range Status   C Diff antigen NEGATIVE NEGATIVE Final   C Diff toxin NEGATIVE NEGATIVE Final   C Diff interpretation No C. difficile detected.  Final    Comment: Performed at [facility], [address] [place], [place] [address]         Radiology Studies: VAS US  UPPER EXTREMITY VENOUS DUPLEX Result Date: 11/19/2024 UPPER VENOUS STUDY  Patient Name:  [name] [facility]  Date of Exam:   11/18/2024 Medical Rec #: [id number]              Accession #:    [id number] Date of Birth: [birth year]              Patient Gender: F Patient Age:   60 years Exam Location:  [facility] Procedure:      VAS US  UPPER EXTREMITY VENOUS DUPLEX Referring Phys: RALPH NETTEY --------------------------------------------------------------------------------  Indications: Swelling Comparison Study: Previous study on 2.27.2025. Performing Technologist: Edilia Elden Appl  Examination Guidelines: A complete evaluation includes B-mode imaging, spectral Doppler, color Doppler, and power Doppler as needed of all accessible portions of each vessel. Bilateral testing is considered an integral part of a complete examination. Limited examinations for reoccurring indications may be performed as noted.  Right  Findings: +----------+------------+---------+-----------+----------+-------+ RIGHT     CompressiblePhasicitySpontaneousPropertiesSummary +----------+------------+---------+-----------+----------+-------+ IJV           Full       Yes       Yes                      +----------+------------+---------+-----------+----------+-------+ Subclavian  Yes       Yes                      +----------+------------+---------+-----------+----------+-------+ Axillary      Full       Yes       Yes                      +----------+------------+---------+-----------+----------+-------+ Brachial      Full       Yes       Yes                      +----------+------------+---------+-----------+----------+-------+ Radial        Full                                          +----------+------------+---------+-----------+----------+-------+ Ulnar         Full                                          +----------+------------+---------+-----------+----------+-------+ Cephalic      None       No        No                       +----------+------------+---------+-----------+----------+-------+ Basilic       Full       Yes       Yes                      +----------+------------+---------+-----------+----------+-------+ Superficial vein thrombosis noted in the right cephalic vein from the middle upper arm to the antecubital fossa.  Left Findings: +----------+------------+---------+-----------+----------+-------+ LEFT      CompressiblePhasicitySpontaneousPropertiesSummary +----------+------------+---------+-----------+----------+-------+ IJV           Full       Yes       Yes                      +----------+------------+---------+-----------+----------+-------+ Subclavian               Yes       Yes                      +----------+------------+---------+-----------+----------+-------+ Axillary      Full       Yes       Yes                       +----------+------------+---------+-----------+----------+-------+ Brachial      Full       Yes       Yes                      +----------+------------+---------+-----------+----------+-------+ Radial        None       No        No                       +----------+------------+---------+-----------+----------+-------+ Ulnar         Full                                          +----------+------------+---------+-----------+----------+-------+  Cephalic      None                                          +----------+------------+---------+-----------+----------+-------+ Basilic       Full       Yes       Yes                      +----------+------------+---------+-----------+----------+-------+ Deep vein thrombosis noted in one of the paired radial veins in the middle and distal forearm. Superficial vein thrombosis noted in the cephalic vein from the distal upper arm to the antecubital fossa.  Summary:  Right: No evidence of deep vein thrombosis in the upper extremity. Findings consistent with acute superficial vein thrombosis involving the right cephalic vein.  Left: Findings consistent with acute deep vein thrombosis involving the left radial veins. Findings consistent with acute superficial vein thrombosis involving the left cephalic vein.  *See table(s) above for measurements and observations.  Diagnosing physician: Lonni Gaskins MD Electronically signed by Lonni Gaskins MD on 11/19/2024 at 8:15:07 AM.    Final         Scheduled Meds:  allopurinol   100 mg Oral Daily   aspirin   81 mg Oral Daily   busPIRone   10 mg Oral BID   dolutegravir   50 mg Oral Daily   emtricitabine -tenofovir  AF  1 tablet Oral Daily   feeding supplement (NEPRO CARB STEADY)  1,000 mL Per Tube q1800   feeding supplement (PROSource TF20)  60 mL Per Tube Daily   gentamicin  cream  1 Application Topical Daily   hydrocerin   Topical BID   hydrocortisone   15 mg Oral Q0600   And    hydrocortisone   5 mg Oral Q1400   insulin  aspart  0-6 Units Subcutaneous Q4H   levothyroxine   125 mcg Oral Q0600   mirtazapine   15 mg Oral QHS   multivitamin  1 tablet Oral QHS   pantoprazole   40 mg Oral Daily   rosuvastatin   10 mg Oral Daily   sodium chloride  flush  3 mL Intravenous Q12H   thiamine   100 mg Oral Daily   Vitamin D  (Ergocalciferol )  50,000 Units Oral Q7 days   Continuous Infusions:  dialysis solution 1.5% low-MG/low-CA     dialysis solution 2.5% low-MG/low-CA       LOS: 4 days   Time spent:  Elsie JAYSON Montclair, DO [facility]  If 7PM-7AM, please contact night-coverage [email or web address]  11/20/2024, 7:34 AM      "

## 2024-11-21 ENCOUNTER — Ambulatory Visit: Payer: Self-pay | Admitting: Infectious Diseases

## 2024-11-21 ENCOUNTER — Other Ambulatory Visit: Payer: Self-pay | Admitting: Primary Care

## 2024-11-21 DIAGNOSIS — J309 Allergic rhinitis, unspecified: Secondary | ICD-10-CM

## 2024-11-21 LAB — CBC
HCT: 27.1 % — ABNORMAL LOW (ref 36.0–46.0)
Hemoglobin: 9.5 g/dL — ABNORMAL LOW (ref 12.0–15.0)
MCH: 34.3 pg — ABNORMAL HIGH (ref 26.0–34.0)
MCHC: 35.1 g/dL (ref 30.0–36.0)
MCV: 97.8 fL (ref 80.0–100.0)
Platelets: 126 10*3/uL — ABNORMAL LOW (ref 150–400)
RBC: 2.77 MIL/uL — ABNORMAL LOW (ref 3.87–5.11)
RDW: 19.4 % — ABNORMAL HIGH (ref 11.5–15.5)
WBC: 15.6 10*3/uL — ABNORMAL HIGH (ref 4.0–10.5)
nRBC: 1.8 % — ABNORMAL HIGH (ref 0.0–0.2)

## 2024-11-21 LAB — RENAL FUNCTION PANEL
Albumin: 2.3 g/dL — ABNORMAL LOW (ref 3.5–5.0)
Anion gap: 16 — ABNORMAL HIGH (ref 5–15)
BUN: 111 mg/dL — ABNORMAL HIGH (ref 6–20)
CO2: 26 mmol/L (ref 22–32)
Calcium: 9 mg/dL (ref 8.9–10.3)
Chloride: 86 mmol/L — ABNORMAL LOW (ref 98–111)
Creatinine, Ser: 7.27 mg/dL — ABNORMAL HIGH (ref 0.44–1.00)
GFR, Estimated: 6 mL/min — ABNORMAL LOW
Glucose, Bld: 116 mg/dL — ABNORMAL HIGH (ref 70–99)
Phosphorus: 3.2 mg/dL (ref 2.5–4.6)
Potassium: 4.1 mmol/L (ref 3.5–5.1)
Sodium: 128 mmol/L — ABNORMAL LOW (ref 135–145)

## 2024-11-21 LAB — PTH, INTACT AND CALCIUM
Calcium, Total (PTH): 8.4 mg/dL — ABNORMAL LOW (ref 8.7–10.2)
PTH: 143 pg/mL — ABNORMAL HIGH (ref 15–65)

## 2024-11-21 LAB — GLUCOSE, CAPILLARY
Glucose-Capillary: 104 mg/dL — ABNORMAL HIGH (ref 70–99)
Glucose-Capillary: 107 mg/dL — ABNORMAL HIGH (ref 70–99)
Glucose-Capillary: 110 mg/dL — ABNORMAL HIGH (ref 70–99)
Glucose-Capillary: 122 mg/dL — ABNORMAL HIGH (ref 70–99)
Glucose-Capillary: 147 mg/dL — ABNORMAL HIGH (ref 70–99)
Glucose-Capillary: 91 mg/dL (ref 70–99)
Glucose-Capillary: 92 mg/dL (ref 70–99)

## 2024-11-21 MED ORDER — BUSPIRONE HCL 5 MG PO TABS
10.0000 mg | ORAL_TABLET | Freq: Two times a day (BID) | ORAL | Status: AC
  Administered 2024-11-21 – 2024-11-22 (×3): 10 mg
  Filled 2024-11-21 (×3): qty 2

## 2024-11-21 MED ORDER — DELFLEX-LC/1.5% DEXTROSE 344 MOSM/L IP SOLN: INTRAPERITONEAL | Status: DC

## 2024-11-21 MED ORDER — RENA-VITE PO TABS
1.0000 | ORAL_TABLET | Freq: Every day | ORAL | Status: AC
  Administered 2024-11-21 – 2024-11-22 (×2): 1
  Filled 2024-11-21 (×2): qty 1

## 2024-11-21 MED ORDER — APIXABAN 5 MG PO TABS
10.0000 mg | ORAL_TABLET | Freq: Two times a day (BID) | ORAL | Status: AC
  Administered 2024-11-21 – 2024-11-22 (×3): 10 mg
  Filled 2024-11-21 (×3): qty 2

## 2024-11-21 MED ORDER — ASPIRIN 81 MG PO CHEW
81.0000 mg | CHEWABLE_TABLET | Freq: Every day | ORAL | Status: AC
  Administered 2024-11-22: 81 mg
  Filled 2024-11-21: qty 1

## 2024-11-21 MED ORDER — POLYETHYLENE GLYCOL 3350 17 G PO PACK: 17.0000 g | PACK | Freq: Every day | ORAL | Status: AC | PRN

## 2024-11-21 MED ORDER — DELFLEX-LC/2.5% DEXTROSE 394 MOSM/L IP SOLN: INTRAPERITONEAL | Status: DC

## 2024-11-21 MED ORDER — MIRTAZAPINE 15 MG PO TABS
15.0000 mg | ORAL_TABLET | Freq: Every day | ORAL | Status: AC
  Administered 2024-11-21 – 2024-11-22 (×2): 15 mg
  Filled 2024-11-21 (×2): qty 1

## 2024-11-21 MED ORDER — ROSUVASTATIN CALCIUM 5 MG PO TABS
10.0000 mg | ORAL_TABLET | Freq: Every day | ORAL | Status: AC
  Administered 2024-11-22: 10 mg
  Filled 2024-11-21: qty 2

## 2024-11-21 MED ORDER — ACETAMINOPHEN 650 MG RE SUPP: 650.0000 mg | Freq: Four times a day (QID) | RECTAL | Status: AC | PRN

## 2024-11-21 MED ORDER — HYDROCORTISONE 5 MG PO TABS
5.0000 mg | ORAL_TABLET | Freq: Every day | ORAL | Status: AC
  Administered 2024-11-21 – 2024-11-22 (×2): 5 mg
  Filled 2024-11-21 (×2): qty 1

## 2024-11-21 MED ORDER — TRAZODONE HCL 50 MG PO TABS: 25.0000 mg | ORAL_TABLET | Freq: Every evening | ORAL | Status: AC | PRN

## 2024-11-21 MED ORDER — DOLUTEGRAVIR SODIUM 50 MG PO TABS
50.0000 mg | ORAL_TABLET | Freq: Every day | ORAL | Status: AC
  Administered 2024-11-22: 50 mg
  Filled 2024-11-21: qty 1

## 2024-11-21 MED ORDER — LOPERAMIDE HCL 1 MG/7.5ML PO SUSP
2.0000 mg | ORAL | Status: AC | PRN
  Administered 2024-11-22: 2 mg
  Filled 2024-11-21 (×2): qty 15

## 2024-11-21 MED ORDER — PANTOPRAZOLE SODIUM 40 MG IV SOLR
40.0000 mg | Freq: Every day | INTRAVENOUS | Status: DC
  Administered 2024-11-21 – 2024-11-22 (×2): 40 mg via INTRAVENOUS
  Filled 2024-11-21 (×2): qty 10

## 2024-11-21 MED ORDER — LEVOTHYROXINE SODIUM 25 MCG PO TABS
125.0000 ug | ORAL_TABLET | Freq: Every day | ORAL | Status: AC
  Administered 2024-11-22: 125 ug
  Filled 2024-11-21: qty 1

## 2024-11-21 MED ORDER — EMTRICITABINE-TENOFOVIR AF 200-25 MG PO TABS
1.0000 | ORAL_TABLET | Freq: Every day | ORAL | Status: AC
  Administered 2024-11-22: 1
  Filled 2024-11-21: qty 1

## 2024-11-21 MED ORDER — ACETAMINOPHEN 325 MG PO TABS
650.0000 mg | ORAL_TABLET | Freq: Four times a day (QID) | ORAL | Status: AC | PRN
  Administered 2024-11-22: 650 mg
  Filled 2024-11-21: qty 2

## 2024-11-21 MED ORDER — ALLOPURINOL 100 MG PO TABS
100.0000 mg | ORAL_TABLET | Freq: Every day | ORAL | Status: AC
  Administered 2024-11-22: 100 mg
  Filled 2024-11-21: qty 1

## 2024-11-21 MED ORDER — HYDROCORTISONE 5 MG PO TABS
15.0000 mg | ORAL_TABLET | Freq: Every day | ORAL | Status: AC
  Administered 2024-11-22: 15 mg
  Filled 2024-11-21 (×2): qty 1

## 2024-11-21 MED ORDER — APIXABAN 5 MG PO TABS: 5.0000 mg | ORAL_TABLET | Freq: Two times a day (BID) | ORAL | Status: AC

## 2024-11-21 NOTE — Progress Notes (Signed)
 Occupational Therapy Treatment Patient Details Name: [name] MRN: [id number] DOB: 03-01-1966 Today's Date: 11/21/2024   History of present illness Patient is 59 yo female admitted on 11/15/24 for presented secondary to weakness and fatigue, found to have hyperkalemia with mild hyponatremia. A DVT in the L UE was found on 2/2. PMH: ESRD on PD, anemia, anxiety, asthma, T2DM, aortic dissection s/p endovascular repair, GERD, Grave's disease, HTN, HLD, papillary thyroid  cancer s/p thyroidectomy (2018) PVD, falls, sarcoidosis, suppurative hydradenitis   OT comments  Patient received in supine and agreeable to OT treatment and getting to EOB. Patient stating she was very sleepy due to medication taken earlier.  Patient performed rolling to get to EOB and was found to have had bowel incontinence. Patient performed rolling in bed to allow for cleaning with min assist for rolling. Patient became more lethargic and difficulty to arouse. EOB was not attempted due to level of arousal.  Patient will benefit from continued inpatient follow up therapy, <3 hours/day.  Acute OT to continue to follow to address established goals to facilitate DC to next venue of care.        If plan is discharge home, recommend the following:  Two people to help with walking and/or transfers;Two people to help with bathing/dressing/bathroom;Assistance with cooking/housework;Assist for transportation;Help with stairs or ramp for entrance   Equipment Recommendations  Other (comment);Tub/shower bench (TBD)    Recommendations for Other Services      Precautions / Restrictions Precautions Precautions: Fall Recall of Precautions/Restrictions: Intact Precaution/Restrictions Comments: NG feeding tube Restrictions Weight Bearing Restrictions Per Provider Order: No       Mobility Bed Mobility Overal bed mobility: Needs Assistance Bed Mobility: Rolling Rolling: Min assist, Used rails         General bed mobility  comments: patient performed rolling in bed to allow for cleaning following bowel incontenance    Transfers                         Balance                                           ADL either performed or assessed with clinical judgement   ADL Overall ADL's : Needs assistance/impaired     Grooming: Set up;Bed level Grooming Details (indicate cue type and reason): washed face to increase alertness     Lower Body Bathing: Total assistance;Bed level Lower Body Bathing Details (indicate cue type and reason): patient required total assist for cleaning following bowel incontenance Upper Body Dressing : Minimal assistance;Bed level Upper Body Dressing Details (indicate cue type and reason): gown change                        Extremity/Trunk Assessment              Vision       Perception     Praxis     Communication Communication Communication: No apparent difficulties   Cognition Arousal: Lethargic, Suspect due to medications Behavior During Therapy: Flat affect Cognition: Difficult to assess Difficult to assess due to: Level of arousal           OT - Cognition Comments: patient lethargic, states due to medication                 Following commands: Impaired Following  commands impaired: Follows one step commands with increased time      Cueing   Cueing Techniques: Verbal cues, Visual cues  Exercises      Shoulder Instructions       General Comments patient with difficulty maitaining arousal and did not attempt EOB    Pertinent Vitals/ Pain       Pain Assessment Pain Assessment: Faces Faces Pain Scale: Hurts little more Pain Location: generalized with movement Pain Descriptors / Indicators: Grimacing Pain Intervention(s): Limited activity within patient's tolerance, Monitored during session, Repositioned  Home Living                                          Prior  Functioning/Environment              Frequency  Min 2X/week        Progress Toward Goals  OT Goals(current goals can now be found in the care plan section)  Progress towards OT goals: Progressing toward goals  Acute Rehab OT Goals Patient Stated Goal: to get better OT Goal Formulation: With patient Time For Goal Achievement: 12/02/24 Potential to Achieve Goals: Good ADL Goals Pt Will Perform Grooming: with set-up;sitting Pt Will Perform Lower Body Dressing: with min assist;sit to/from stand Pt Will Transfer to Toilet: with min assist;stand pivot transfer  Plan      Co-evaluation                 AM-PAC OT 6 Clicks Daily Activity     Outcome Measure   Help from another person eating meals?: A Little Help from another person taking care of personal grooming?: A Little Help from another person toileting, which includes using toliet, bedpan, or urinal?: Total Help from another person bathing (including washing, rinsing, drying)?: Total Help from another person to put on and taking off regular upper body clothing?: A Little Help from another person to put on and taking off regular lower body clothing?: Total 6 Click Score: 12    End of Session    OT Visit Diagnosis: Unsteadiness on feet (R26.81);Muscle weakness (generalized) (M62.81)   Activity Tolerance Patient limited by lethargy   Patient Left in bed;with call bell/phone within reach;with bed alarm set   Nurse Communication Mobility status        Time: 8679-8655 OT Time Calculation (min): 24 min  Charges: OT General Charges $OT Visit: 1 Visit OT Treatments $Self Care/Home Management : 23-37 mins  Dick Laine, OTA Acute Rehabilitation Services  Office [phone]   Jeb LITTIE Laine 11/21/2024, 2:49 PM

## 2024-11-21 NOTE — Plan of Care (Signed)

## 2024-11-21 NOTE — Progress Notes (Signed)
 " PROGRESS NOTE    [name]  FMW:[id number] DOB: [birth year] DOA: 11/15/2024 PCP: Gretta Comer POUR, NP   Brief Narrative:  [name] is a 59 y.o. female with a history of hypertension, hyperlipidemia, diabetes mellitus, hypothyroidism, HIV, ESRD on PD, chronic systolic heart failure, anxiety, depression, dysphagia, aortic aneurysm, aortic dissection s/p repair, pituitary apoplexy, adrenal insufficiency, sarcoid, anemia, breast cancer, gout, asthma.  Patient presented secondary to weakness and fatigue, found to have hyperkalemia with mild hyponatremia. During workup, patient was also found to have a significantly elevated alkaline phosphatase with elevated AST/ALT. GI consulted. Left upper arm DVT identified on 2/2; heparin  IV started.  Assessment & Plan:   Principal Problem:   Hyperkalemia Active Problems:   History of aortic dissection   Hypertension   HIV (human immunodeficiency virus infection) (HCC)   Type 2 diabetes mellitus with hyperlipidemia (HCC)   Hypothyroidism   Depression   Asthma   GAD (generalized anxiety disorder)   Hyperlipidemia   ESRD (end stage renal disease) (HCC)   Normocytic anemia   Pharyngoesophageal dysphagia   Chronic gout   H/O aortic aneurysm repair   Benign neoplasm of pituitary gland (HCC)   Abnormal esophagram   Hypoglycemia   HFrEF (heart failure with reduced ejection fraction) (HCC)   Pituitary apoplexy   History of breast cancer   LFT elevation   Cholestasis (HCC)   Elevated alkaline phosphatase level   Cholestatic hepatitis   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  - Hemoglobin downtrending -concern for bleeding but no obvious sources at this time, continues to require anticoagulation - FOBT pending but previously positive in December (2 of 3 samples) despite ongoing brown stool -  Transfused 1u PRBC 11/19/24 - Haptoglobin wnl, LDH elevated in isolation, Tbili WNL  Hyperkalemia Likely secondary to ESRD. Resolved with with Lokelma  and dialysis.   ESRD on PD Nephrology consulted for peritoneal dialysis. Regular diet (okay with nephrology)   Hyponatremia Mild. Management with peritoneal dialysis.   Diabetes mellitus type 2 Well controlled - most recent hemoglobin A1C of 5.6%.   Dysphagia Esophageal dysmotility Patient with workup at [facility]. Recommendation at that time was for discharge with an NG tube with home health services, in addition to outpatient follow-up for manometry. - Continue tube feeds - Encourage oral intake -- Dietician recommendations (2/2): Continue tube feeding via small bore feeding tube: Nepro Carb Steady at 60 ml/h x16 hours 6PM-10AM (960 ml per day) Prosource TF20 60 ml daily MONITOR NEED FOR FWF Provides 1779 kcal, 98 gm protein, 698 ml free water  daily    Elevated alkaline phosphatase Elevated AST/ALT Unclear etiology. GGT elevated, suggesting biliary source. RUQ ultrasound significant for cholelithiasis without evidence of cholecystitis; patient without pain -Continue conservative management per GI - Follow-up labs improving, within normal limits   Left upper extremity DVT Acute SVTs Likely provoked based on recent history - DVT involves the left radial veins.   Pituitary apoplexy Adrenal insufficiency Patient started on stress dose steroids secondary to hypotension with improved blood pressure readings.  - Continue hydrocortisone  (15 mg q0600 and 5 mg q1400)   Hypotension, resolved Likely related to adrenal insufficiency.  Midodrine  discontinued. - Continue treatment of adrenal sufficiency as above -Continue to hold home metoprolol  and losartan    Hypothyroidism - S/p thyroidectomy. - Continue Synthroid  - most recent TSH/Free T4 WNL   Chronic HFrEF LVEF  of 20% on most recent Transthoracic Echocardiogram from admission at  [facility]. Not able to tolerate goal directed medical therapy secondary to hypotension. - Strict in/out - Metoprolol /losartan  on hold given hypotension   HIV disease - Continue Descovy  and [name]  History of aortic dissection/aortic aneurysm S/p repair of dissection - Continue aspirin  Nausea and vomiting Resolved - Zofran  as needed Asthma - Well controlled, Continue albuterol  as needed Gout - Continue allopurinol  Depression.Anxiety - Continue Remeron  and BuSpar  History of breast cancer Patient is s/p lumpectomy, chemotherapy and immunotherapy  DVT prophylaxis:  apixaban  (ELIQUIS ) tablet 10 mg  apixaban  (ELIQUIS ) tablet 5 mg   Code Status:   Code Status: Full Code  Family Communication: None present  Status is: Inpatient  Dispo: The patient is from: Home              Anticipated d/c is to: To be determined              Anticipated d/c date is: 24-hour              Patient currently is medically stable for discharge  Consultants:  PCCM, nephrology  Antimicrobials:  None  Subjective: No acute issues or events overnight, still having difficulty ambulating with physical therapy but otherwise denies nausea vomiting diarrhea constipation headache fevers chills or chest pain  Objective: Vitals:   11/20/24 1947 11/20/24 2359 11/21/24 0415 11/21/24 0750  BP: (!) 148/70 139/65 129/66 111/60  Pulse: (!) 104 95 (!) 106 95  Resp: 20 20 20 18   Temp: (!) 97.5 F (36.4 C) 98.3 F (36.8 C) 97.7 F (36.5 C) 97.7 F (36.5 C)  TempSrc: Oral Oral Oral Oral  SpO2: 98% 100% 97% 97%  Weight:      Height:        Intake/Output Summary (Last 24 hours) at 11/21/2024 1332 Last data filed at 11/21/2024 0700 Gross per 24 hour  Intake 120 ml  Output 928 ml  Net -808 ml   Filed Weights   11/16/24 0500 11/17/24 0341 11/19/24 0342  Weight: 75.6 kg 75 kg 78 kg    Examination:  General:  Pleasantly resting in bed, No acute distress. HEENT: NG intact Neck:  Without mass or deformity.  Trachea is  midline. Lungs:  Clear to auscultate bilaterally without rhonchi, wheeze, or rales. Heart:  Regular rate and rhythm.  Without murmurs, rubs, or gallops. Abdomen:  Soft, nontender, nondistended.  Without guarding or rebound. Extremities: Without cyanosis, clubbing, edema, or obvious deformity. Skin:  Warm and dry, no erythema.   Data Reviewed: I have personally reviewed following labs and imaging studies  CBC: Recent Labs  Lab 11/15/24 1225 11/16/24 0258 11/17/24 0155 11/19/24 0705 11/19/24 1431 11/19/24 1832 11/20/24 0330 11/21/24 0306  WBC 13.1* 13.5* 17.7* 13.3*  --   --  11.5* 15.6*  NEUTROABS 9.8*  --   --   --   --   --   --   --   HGB 9.3* 9.2* 8.6* 7.4* 7.0* 6.5* 9.5* 9.5*  HCT 26.9* 25.7* 24.1* 20.5* 20.2* 18.7* 27.5* 27.1*  MCV 101.9* 99.6 98.4 99.0  --   --  96.8 97.8  PLT 142* 138* 151 111*  --   --  123* 126*   Basic Metabolic Panel: Recent Labs  Lab 11/15/24 1225 11/16/24 0258 11/17/24 0155 11/17/24 1945 11/18/24 0148 11/19/24 0705 11/21/24 1031  NA 131* 130* 124* 125* 128* 128* 128*  K 5.8* 4.5 4.8 4.9 4.4 4.1 4.1  CL 88* 87* 82* 84*  84* 85* 86*  CO2 23 24 22 22 23 25 26   GLUCOSE 49* 91 218* 96 135* 78 116*  BUN 103* 96* 92* 105* 101* 107* 111*  CREATININE 9.69* 9.10* 8.30* 8.63* 8.01* 7.59* 7.27*  CALCIUM  8.9 8.7* 8.6* 8.6* 8.6* 8.9 9.0  MG 2.1 2.0  --   --   --   --   --   PHOS  --  3.8  --   --   --   --  3.2   GFR: Estimated Creatinine Clearance: 8.9 mL/min (A) (by C-G formula based on SCr of 7.27 mg/dL (H)). Liver Function Tests: Recent Labs  Lab 11/15/24 1225 11/16/24 0258 11/17/24 0155 11/18/24 0148 11/19/24 0705 11/21/24 1031  AST 103* 86* 82* 70* 87*  --   ALT 78* 65* 64* 59* 61*  --   ALKPHOS 1,218* 1,114* 1,196* 1,092* 1,078*  --   BILITOT 0.8 0.7 0.6 0.5 0.5  --   PROT 6.3* 5.9* 6.6 6.3* 6.1*  --   ALBUMIN  2.3* 2.0* 2.3* 2.3* 2.3* 2.3*   Recent Labs  Lab 11/15/24 1225  LIPASE 21   No results for input(s): AMMONIA in  the last 168 hours. Coagulation Profile: Recent Labs  Lab 11/17/24 0155  INR 1.1   Cardiac Enzymes: No results for input(s): CKTOTAL, CKMB, CKMBINDEX, TROPONINI in the last 168 hours. BNP (last 3 results) No results for input(s): PROBNP in the last 8760 hours. HbA1C: No results for input(s): HGBA1C in the last 72 hours. CBG: Recent Labs  Lab 11/20/24 2051 11/21/24 0003 11/21/24 0423 11/21/24 0821 11/21/24 1103  GLUCAP 138* 92 147* 110* 122*   Lipid Profile: No results for input(s): CHOL, HDL, LDLCALC, TRIG, CHOLHDL, LDLDIRECT in the last 72 hours. Thyroid  Function Tests: No results for input(s): TSH, T4TOTAL, FREET4, T3FREE, THYROIDAB in the last 72 hours.  Anemia Panel: No results for input(s): VITAMINB12, FOLATE, FERRITIN, TIBC, IRON , RETICCTPCT in the last 72 hours. Sepsis Labs: No results for input(s): PROCALCITON, LATICACIDVEN in the last 168 hours.  Recent Results (from the past 240 hours)  C Difficile Quick Screen w PCR reflex     Status: None   Collection Time: 11/15/24  1:03 PM   Specimen: STOOL  Result Value Ref Range Status   C Diff antigen NEGATIVE NEGATIVE Final   C Diff toxin NEGATIVE NEGATIVE Final   C Diff interpretation No C. difficile detected.  Final    Comment: Performed at [facility], [address], [place], [place] [address]         Radiology Studies: No results found.       Scheduled Meds:  [START ON 11/22/2024] allopurinol   100 mg Per Tube Daily   apixaban   10 mg Per Tube BID   Followed by   NOREEN ON 11/27/2024] apixaban   5 mg Per Tube BID   [START ON 11/22/2024] aspirin   81 mg Per Tube Daily   busPIRone   10 mg Per Tube BID   [START ON 11/22/2024] dolutegravir   50 mg Per Tube Daily   [START ON 11/22/2024] emtricitabine -tenofovir  AF  1 tablet Per Tube Daily   feeding supplement (NEPRO CARB STEADY)  1,000 mL Per Tube Q24H   feeding supplement (PROSource TF20)  60 mL Per Tube  Daily   gentamicin  cream  1 Application Topical Daily   hydrocerin   Topical BID   [START ON 11/22/2024] hydrocortisone   15 mg Per Tube Q0600   And   hydrocortisone   5 mg Per Tube  Q1400   insulin  aspart  0-6 Units Subcutaneous Q4H   [START ON 11/22/2024] levothyroxine   125 mcg Per Tube Q0600   mirtazapine   15 mg Per Tube QHS   multivitamin  1 tablet Per Tube QHS   pantoprazole  (PROTONIX ) IV  40 mg Intravenous Daily   [START ON 11/22/2024] rosuvastatin   10 mg Per Tube Daily   sodium chloride  flush  3 mL Intravenous Q12H   Vitamin D  (Ergocalciferol )  50,000 Units Oral Q7 days   Continuous Infusions:  dialysis solution 2.5% low-MG/low-CA       LOS: 5 days   Time spent:  Felicia JAYSON Montclair, DO [facility]  If 7PM-7AM, please contact night-coverage [email or web address]  11/21/2024, 1:32 PM      "

## 2024-11-21 NOTE — Progress Notes (Signed)
 [facility] Progress Note  Subjective:  Seen in room No c/o's today   Presentation summary: 59 y.o. 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  Started on tube feeds, phos binders were held, blood cultures all negative.  GI consulted and plan for outpatient manometry.  Patient also with questionable exit site infection of the PD and completed 10-day course of Keflex  with no signs of infection in the peritoneal fluid.  P/W weakness and fatigue with blood pressures that were on the lower side in the emergency department with a BUN/creatinine 103/9.69 elevated alkaline phosphatase, potassium 5.8 treated medically in the ED.   Per discussion 2/02 w/ PD staff in [place], per the clinic pt was very stable on PD for a good while, but since her pituitary apoplexy her manor/ attitude has changed for the worse. Her baseline creatinine is 12.5 and her usual BUN is 25-40.  Her KT/V had been 1.7. The last one was low at 1.4-1.5, they are not sure if this result was good because her sister did it this time (her 1st time), and also they had a new contract with [facility] are there were issues happening at that time.   Vitals:   11/20/24 1947 11/20/24 2359 11/21/24 0415 11/21/24 0750  BP: (!) 148/70 139/65 129/66 111/60  Pulse: (!) 104 95 (!) 106 95  Resp: 20 20 20 18   Temp: (!) 97.5 F (36.4 C) 98.3 F (36.8 C) 97.7 F (36.5 C) 97.7 F (36.5 C)   TempSrc: Oral Oral Oral Oral  SpO2: 98% 100% 97% 97%  Weight:      Height:        Exam:  alert, nad , on RA  no jvd  Chest cta bilat  Cor reg no RG  Abd obese soft ntnd no ascites   Ext no LE edema   Alert, NF, ox3   PD cath mid-abdomen intact  BP meds at home:  None     OP PD: Dr Norine Flint  5 cycles, 2.25 fill vol, 2.5% dianeal , 1.5h dwell  74.5kg dry wt    Assessment/ Plan: # ESRD on PD - workup at [facility] was negative for infection - cont PD nightly while here  # volume - no sig vol excess on exam - bp's low normal, some high HR's - use 1/2 1.5% and 1/2 2.5% fluids w/ PD tonight   # BP - as above  - not on midodrine  or any bp lowering meds  # hx of pituitary apoplexy - w/ 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 on hydrocortisone  - planned future stim test to evaluate the HPA axis  # Hypothyroidism - on Synthroid   # Severe malnutrition - started on tube feeds at [facility]   # diabetes type 2   # 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   No results for input(s): IRON , TIBC, FERRITIN in the last 168 hours. Inpatient medications:  [START ON 11/22/2024] allopurinol   100 mg Per Tube Daily   apixaban   10 mg Per Tube BID   Followed by   NOREEN ON 11/27/2024] apixaban   5 mg Per Tube BID   [START ON 11/22/2024] aspirin   81 mg Per Tube Daily   busPIRone   10 mg Per Tube BID   [START ON 11/22/2024] dolutegravir   50 mg Per Tube Daily   [START ON 11/22/2024] emtricitabine -tenofovir  AF  1  tablet Per Tube Daily   feeding supplement (NEPRO CARB STEADY)  1,000 mL Per Tube Q24H   feeding supplement (PROSource TF20)  60 mL Per Tube Daily   gentamicin  cream  1 Application Topical Daily   hydrocerin   Topical BID   [START ON 11/22/2024] hydrocortisone   15 mg Per Tube Q0600   And   hydrocortisone   5 mg Per Tube Q1400   insulin  aspart  0-6 Units Subcutaneous Q4H   [START ON 11/22/2024] levothyroxine   125 mcg Per Tube Q0600   mirtazapine   15 mg Per Tube QHS   multivitamin  1 tablet Per Tube QHS   pantoprazole  (PROTONIX ) IV  40 mg Intravenous Daily   [START ON 11/22/2024] rosuvastatin   10 mg Per Tube Daily   sodium chloride  flush  3 mL Intravenous Q12H   Vitamin D  (Ergocalciferol )  50,000 Units Oral Q7 days    dialysis solution 2.5% low-MG/low-CA     acetaminophen  **OR** acetaminophen , albuterol , gentamicin  cream, loperamide  HCl, ondansetron  (ZOFRAN ) IV, polyethylene glycol, traZODone , trimethobenzamide 

## 2024-11-21 NOTE — Evaluation (Signed)
 Clinical/Bedside Swallow Evaluation Patient Details  Name: [name] MRN: [id number] Date of Birth: 12-19-1965  Today's Date: 11/21/2024 Time: SLP Start Time (ACUTE ONLY): 1112 SLP Stop Time (ACUTE ONLY): 1122 SLP Time Calculation (min) (ACUTE ONLY): 10 min  Past Medical History:  Past Medical History:  Diagnosis Date   Acute pain of right shoulder due to trauma 06/08/2023   Acute pancreatitis 10/23/2021   Acute renal failure superimposed on stage 4 chronic kidney disease (HCC) 10/24/2021   Allergy    Anemia    Normocytic   Antibiotic-induced yeast infection 09/28/2020   Anxiety    Asthma    Bronchitis 2005   Bursitis of left shoulder 09/06/2019   CKD (chronic kidney disease)    Dialysis on Mon-Wed- Fri   CLASS 1-EXOPHTHALMOS-THYROTOXIC 02/08/2007   Cognitive dysfunction 02/21/2020   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  Genes Analyzed (43 total): APC*, ATM*, AXIN2, BARD1, BMPR1A, BRCA1*, BRCA2*, BRIP1*, CDH1*, CDK4, CDKN2A, CHEK2*, DICER1, GALNT12, HOXB13, MEN1, MLH1*, MRE11A, MSH2*, MSH3, MSH6*, MUTYH*, NBN, NF1*, NTHL1, PALB2*, PMS2*, POLD1, POLE, PTEN*, RAD50, RAD51C*, RAD51D*, RET, SDHB, SDHD, SMAD4, SMARCA4, STK11 and TP53* (sequencing and deletion/duplication); EGFR (s   GERD 07/24/2006   GRAVE'S DISEASE 01/01/2008   History of hidradenitis suppurativa    History of kidney  stones    History of thrush    HIV DISEASE 07/24/2006   dx March 05   Hyperlipidemia    HYPERTENSION 07/24/2006   Hyperthyroidism 08/2006   Grave's Disease -diffuse radiotracer uptake 08/25/06 Thyroid  scan-Cold nodule to R lower lobe of thyrorid   Ileus (HCC) 02/14/2023   Left bundle branch block (LBBB)    Malnutrition of moderate degree 02/08/2023   Menometrorrhagia    hx of   Nephrolithiasis    Panniculitis 05/12/2020   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 Completed 2 yrs Prednisone  after lung bx confirmation. No symptoms since then.   Sebaceous cyst 04/21/2020   Shock (HCC) 09/23/2024   Suppurative hidradenitis    Thyroid  cancer (HCC)    THYROID  NODULE, RIGHT 02/08/2007   Past Surgical History:  Past Surgical History:  Procedure Laterality Date   AORTIC ARCH DEBRANCHING N/A 08/25/2023   Procedure: AORTIC ARCH DEBRANCHING USING 12X8X8MM HEMASHIELD GRAFT;  Surgeon: Lucas Dorise POUR, MD;  [facility];  Service: Open Heart Surgery;  Laterality: N/A;  with bypasses to the innominate and left common carotid arteries   APPLICATION OF WOUND VAC N/A 01/20/2021   Procedure: APPLICATION OF WOUND VAC;  Surgeon: Lowery Estefana RAMAN, DO;  Location: [facility];  Service: Plastics;  Laterality: N/A;   BREAST EXCISIONAL BIOPSY Right 04/26/2018   right axilla negative    BREAST EXCISIONAL BIOPSY Left 04/26/2018   left axilla negative   BREAST LUMPECTOMY Right 10/03/2018   malignant   BREAST LUMPECTOMY WITH RADIOACTIVE SEED AND SENTINEL LYMPH NODE BIOPSY Right 10/03/2018   Procedure: RIGHT BREAST LUMPECTOMY WITH RADIOACTIVE SEED AND SENTINEL LYMPH NODE MAPPING;  Surgeon: Vanderbilt Ned, MD;  [facility]: [facility];  Service: General;  Laterality: Right;   BREAST SURGERY  1997   Breast Reduction    CAPD INSERTION N/A 12/21/2023   Procedure: LAPAROSCOPIC INSERTION CONTINUOUS AMBULATORY PERITONEAL DIALYSIS  (CAPD) CATHETER; LAPAROSCOPIC OMENTUMPEXY;  Surgeon: Magda Ned SAILOR, MD;  [facility];  Service: Vascular;  Laterality: N/A;   CYSTOSCOPY W/ URETERAL STENT REMOVAL  11/09/2012   Procedure: CYSTOSCOPY WITH STENT REMOVAL;  Surgeon: Ricardo Likens, MD;  [facility]: [facility];  Service: Urology;  Laterality: Right;   CYSTOSCOPY WITH RETROGRADE PYELOGRAM, URETEROSCOPY AND STENT PLACEMENT  11/09/2012   Procedure: CYSTOSCOPY WITH RETROGRADE PYELOGRAM, URETEROSCOPY AND STENT PLACEMENT;  Surgeon: Ricardo Likens, MD;  [facility];  Service: Urology;  Laterality: Left;  LEFT URETEROSCOPY, STONE MANIPULATION, left STENT exchange    CYSTOSCOPY WITH STENT PLACEMENT  10/02/2012   Procedure: CYSTOSCOPY WITH STENT PLACEMENT;  Surgeon: Ricardo Likens, MD;  [facility];  Service: Urology;  Laterality: Left;   DEBRIDEMENT AND CLOSURE WOUND N/A 01/20/2021   Procedure: Excision of abdominal wound with closure;  Surgeon: Lowery Estefana RAMAN, DO;  Location: [facility];  Service: Plastics;  Laterality: N/A;   DILATION AND CURETTAGE OF UTERUS  11/2002   s/p for 1st trimester nonviable pregnancy   ESOPHAGOGASTRODUODENOSCOPY N/A 09/11/2024   Procedure: EGD (ESOPHAGOGASTRODUODENOSCOPY);  Surgeon: Abran Norleen SAILOR, MD;  Location: [facility];  Service: Gastroenterology;  Laterality: N/A;   EYE SURGERY     sty under eyelid   INCISE AND DRAIN ABCESS  08/2002   s/p I &D for  righ inframmary fold hidradenitis   INCISION AND DRAINAGE PERITONSILLAR ABSCESS  12/2001   IR CV LINE INJECTION  06/07/2018   IR FLUORO GUIDE CV LINE RIGHT  01/30/2023   IR IMAGING GUIDED PORT INSERTION  06/20/2018   IR REMOVAL TUN ACCESS W/ PORT W/O FL MOD SED  06/20/2018   IR US  GUIDE VASC ACCESS RIGHT  01/30/2023   IRRIGATION AND DEBRIDEMENT ABSCESS  01/31/2012   Procedure: IRRIGATION AND DEBRIDEMENT ABSCESS;  Surgeon: Alm VEAR Angle, MD;  [facility];  Service: General;  Laterality: Right;  right breast and axilla    LAPAROSCOPIC REPOSITIONING CAPD CATHETER Left 01/10/2024   Procedure: Removal of peritoneal dialysis Catheter.;  Surgeon: Magda Ned SAILOR, MD;  Location: [facility] OR;  Service: Vascular;  Laterality: Left;   LAPAROSCOPY N/A 01/10/2024   Procedure: LAPAROSCOPY, DIAGNOSTIC;  Surgeon: Magda Ned SAILOR, MD;  Location: [facility] OR;  Service: Vascular;  Laterality: N/A;   NEPHROLITHOTOMY  10/02/2012   Procedure: NEPHROLITHOTOMY PERCUTANEOUS;  Surgeon: Ricardo Likens, MD;  [facility];  Service: Urology;  Laterality: Right;  First Stage Percutaneous Nephrolithotomy with Surgeon Access, Left Ureteral Stent     NEPHROLITHOTOMY  10/04/2012   Procedure: NEPHROLITHOTOMY PERCUTANEOUS SECOND LOOK;  Surgeon: Ricardo Likens, MD;  [facility];  Service: Urology;  Laterality: Right;      NEPHROLITHOTOMY  10/08/2012   Procedure: NEPHROLITHOTOMY PERCUTANEOUS;  Surgeon: Ricardo Likens, MD;  [facility];  Service: Urology;  Laterality: Right;  THIRD STAGE, nephrostomy tube exchange x 2   NEPHROLITHOTOMY  10/11/2012   Procedure: NEPHROLITHOTOMY PERCUTANEOUS SECOND LOOK;  Surgeon: Ricardo Likens, MD;  [facility];  Service: Urology;  Laterality: Right;  RIGHT 4 STAGE PERCUTANOUS NEPHROLITHOTOMY, right URETEROSCOPY WITH HOLMIUM LASER    PANNICULECTOMY N/A 12/21/2020   Procedure: PANNICULECTOMY;  Surgeon: Lowery Estefana RAMAN, DO;  [facility];  Service: Plastics;  Laterality: N/A;    PERCUTANEOUS NEPHROSTOLITHOTOMY  04/2022   PORT-A-CATH REMOVAL N/A 07/16/2020   Procedure: REMOVAL PORT-A-CATH;  Surgeon: Vanderbilt Ned, MD;  Location: [facility];  Service: General;  Laterality: N/A;   PORTACATH PLACEMENT Left 05/17/2018   Procedure: INSERTION PORT-A-CATH;  Surgeon: Vernetta Berg, MD;  Location: [facility];  Service: General;  Laterality: Left;   RADICAL NECK DISSECTION  04/12/2017   limited/notes 04/12/2017   RADICAL NECK DISSECTION N/A 04/12/2017   Procedure: RADICAL NECK DISSECTION;  Surgeon: Carlie Clark, MD;  Location: [facility] OR;  Service: ENT;  Laterality: N/A;  limited neck dissection 2 hours total   REDUCTION MAMMAPLASTY Bilateral 1998   RIGHT/LEFT HEART CATH AND CORONARY ANGIOGRAPHY N/A 03/12/2020   Procedure: RIGHT/LEFT HEART CATH AND CORONARY ANGIOGRAPHY;  Surgeon: Cherrie Toribio SAUNDERS, MD;  [facility];  Service: Cardiovascular;  Laterality: N/A;   RIGHT/LEFT HEART CATH AND CORONARY ANGIOGRAPHY N/A 09/26/2024   Procedure: RIGHT/LEFT HEART CATH AND CORONARY ANGIOGRAPHY;  Surgeon: Darron Deatrice LABOR, MD;  [facility];  Service: Cardiovascular;  Laterality: N/A;   [place]  1994   TEE WITHOUT CARDIOVERSION N/A 08/25/2023   Procedure: TRANSESOPHAGEAL ECHOCARDIOGRAM;  Surgeon: Lucas Dorise POUR, MD;  [facility];  Service: Open Heart Surgery;  Laterality: N/A;   TENCKHOFF CATHETER INSERTION Left 01/10/2024   Procedure: INSERTION, CATHETER, DIALYSIS, PERITONEAL;  Surgeon: Magda Ned SAILOR, MD;  [facility];  Service: Vascular;  Laterality: Left;   THORACIC AORTIC ENDOVASCULAR STENT GRAFT N/A 02/02/2023   Procedure: THORACIC AORTIC ENDOVASCULAR STENT GRAFT;  Surgeon: Lanis Fonda BRAVO, MD;  Location: [facility] OR;  Service: Vascular;  Laterality: N/A;   THORACIC AORTIC ENDOVASCULAR STENT GRAFT N/A 08/25/2023   Procedure: THORACIC AORTIC ENDOVASCULAR STENT GRAFT;  Surgeon: Lanis Fonda BRAVO, MD;  Location: [facility] OR;   Service: Vascular;  Laterality: N/A;   THYROIDECTOMY  04/12/2017   completion/notes 04/12/2017   THYROIDECTOMY N/A 04/12/2017   Procedure: THYROIDECTOMY;  Surgeon: Carlie Clark, MD;  Location: [facility] OR;  Service: ENT;  Laterality: N/A;  Completion Thyroidectomy   TOTAL THYROIDECTOMY  2010   ULTRASOUND GUIDANCE FOR VASCULAR ACCESS Right 08/25/2023   Procedure: ULTRASOUND GUIDANCE FOR VASCULAR ACCESS, RIGHT FEMORAL ARTERY;  Surgeon: Lanis Fonda BRAVO, MD;  Location: [facility] OR;  Service: Vascular;  Laterality: Right;   HPI:  Laconda Basich is a 59 y.o. female who presented 1/30 secondary to weakness and fatigue, found to have hyperkalemia with mild hyponatremia. During workup, patient was also found to have a significantly elevated alkaline phosphatase with elevated AST/ALT. GI consulted. Left upper arm DVT identified on 2/2; heparin  IV started. Pt with hx dysphagia which is primarily esophageal in nature per chart reveiw.  Pt seen by SLP at [facility] earlier this month with MBSS ordered, but not completed.  Seen by  ST x2 in December as well.  Pt with a history of hypertension, hyperlipidemia, diabetes mellitus, hypothyroidism, HIV, ESRD on PD, chronic systolic heart failure, anxiety, depression, dysphagia, aortic aneurysm, aortic dissection s/p repair, pituitary apoplexy, adrenal insufficiency, sarcoid, anemia, breast cancer, gout, asthma.    Assessment / Plan / Recommendation  Clinical Impression  Pt presents with what has been suspected to be a primarily esophageal dysphagia for which she has been seen by both ST and GI over the past couple of months.  Pt with cortrak in place.  She exhibits slightly hoarse vocal quality.  Most recent CSE 10/30/24 with [facility] system: Patient presents with no clinical signs of an oropharyngeal dysphagia but does present with clinical signs/symptoms of a possible esophageal dysphagia component in setting of failure to thrive with minimal to no PO intake over the last few months.  Mastication and manipulation of solid was adequate though required x2 swallows to clear. No overt signs of aspiration appreciated with limited accepted trials of thin liquid, puree, and solid. Voicing remained clear though is mildly weak/hoarse. Patient reporting symptoms concerning for a possible esophageal dysphagia component including globus sensation and minimal PO intake with 20+ pound weight loss over the last few months. She endorses no appetite. Recommend initiating a Regular Diet with L0: Thin Liquids with aspiration and reflux precautions as outlined belwo. Recommend further workup with GI. No further skilled speech pathology appears warranted at this time, however, if new concerns arise, would consider a Video Fluoroscopic Swallow Study (VFSS) to further assess oropharyngeal swallow function and rule out aspiration. A MBSS was ordered, but was not completed. During that admission A Barium Swallow Study was completed on 11/06/24 which revealed a small hiatal hernia, mild esophageal dysmotility, and the barium tablet that became lodged near the gastroesophageal junction. While food was not given during Barium swallow study, dysmotility seen with thin liquids will almost certainly be seen at the same rate (if not worse) with solids. Following this study, Gastroenterology recommended esophageal manometry as an outpatient, and Videofluoroscopic Swallow Study (VFSS) while inpatient. Pt deferred MBS/BFSS study given the need to consume barium, her current odynophagia, and the likelihood that symptoms are esophageal in nature, but was interested in pursuing manometry.   Seen by GI this admission: Chronic dysphagia of unclear etiology. She reports that she cannot get it down and from what I hear describing something that sounds like transfer dysphagia. Sister participates in the history and indicates the patient cannot swallow solids without drinking liquid. As reflected in notes from [facility] and here below no  clear cause. [facility] suggested a possible esophageal manometry. I think that is probably low yield and at this point not something we can do while she is an inpatient at [facility]. Typically an outpatient study at [place] [facility] with limited availability.  Today pt accepted limited PO trials: thin liquid by straw and puree by spoon x1.  There were no clinical s/s of aspiration observed.  There was significant difficulty swallowing puree, which pt describes as inability to get bolus to go down.  She politely declined trial of graham crackers.  Discussed options with pt who still does not wish to pursue MBSS because barium has been upsetting to her stomach.  She is looking for answer for her swallowing deficits and is agreeable to a FEES.  This will not allow for visualization of cervical esophagus, but will rule out aspiration, and may provide information regarding UES opening. Pt has not been seen by ENT, and direct  visualization of the larynx may clarify if a referral would be beneficial.  Discussed diet preference with pt who prefers to continue current regular diet with choosing foods like soup over a pureed or full liquid diet.  Pt has been taking medications through NG tube.  Recommend continuing regular texture diet with thin liquid.  Plan for FEES next date as schedule permits.    SLP Visit Diagnosis: Dysphagia, pharyngoesophageal phase (R13.14)    Aspiration Risk  Risk for inadequate nutrition/hydration    Diet Recommendation Regular;Thin liquid    Liquid Administration via: Cup;Straw Medication Administration: Via alternative means Compensations: Slow rate;Small sips/bites Postural Changes: Seated upright at 90 degrees    Other Recommendations Oral Care Recommendations: Oral care BID      Functional Status Assessment  (TBD)  Frequency and Duration  (TBD)          Prognosis Prognosis for improved oropharyngeal function:  (TBD)      Swallow Study   General HPI: [name] is a 59 y.o. female who presented 1/30 secondary to weakness and fatigue, found to have hyperkalemia with mild hyponatremia. During workup, patient was also found to have a significantly elevated alkaline phosphatase with elevated AST/ALT. GI consulted. Left upper arm DVT identified on 2/2; heparin  IV started. Pt with hx dysphagia which is primarily esophageal in nature per chart reveiw.  Pt seen by SLP at [facility] earlier this month with MBSS ordered, but not completed.  Seen by ST x2 in December as well.  Pt with a history of hypertension, hyperlipidemia, diabetes mellitus, hypothyroidism, HIV, ESRD on PD, chronic systolic heart failure, anxiety, depression, dysphagia, aortic aneurysm, aortic dissection s/p repair, pituitary apoplexy, adrenal insufficiency, sarcoid, anemia, breast cancer, gout, asthma. Type of Study: Bedside Swallow Evaluation Previous Swallow Assessment: clinical assessment x3 over past 2 months Diet Prior to this Study: Regular;Thin liquids (Level 0) Temperature Spikes Noted: No Respiratory Status: Room air History of Recent Intubation: No Behavior/Cognition: Alert;Pleasant mood;Cooperative Oral Cavity Assessment: Within Functional Limits Oral Care Completed by SLP: No Oral Cavity - Dentition: Adequate natural dentition Vision: Functional for self-feeding Patient Positioning: Upright in bed Baseline Vocal Quality: Normal Volitional Cough: Strong Volitional Swallow: Able to elicit    Oral/Motor/Sensory Function Overall Oral Motor/Sensory Function: Within functional limits   Ice Chips Ice chips: Not tested   Thin Liquid Thin Liquid: Within functional limits Presentation: Straw    Nectar Thick Nectar Thick Liquid: Not tested   Honey Thick Honey Thick Liquid: Not tested   Puree Puree: Impaired Presentation: Spoon Other Comments: odynophagia   Solid     Solid: Not tested      Anette FORBES Grippe, MA, CCC-SLP Acute Rehabilitation Services Office:  [phone] 11/21/2024,12:17 PM

## 2024-11-22 LAB — GLUCOSE, CAPILLARY
Glucose-Capillary: 102 mg/dL — ABNORMAL HIGH (ref 70–99)
Glucose-Capillary: 107 mg/dL — ABNORMAL HIGH (ref 70–99)
Glucose-Capillary: 115 mg/dL — ABNORMAL HIGH (ref 70–99)
Glucose-Capillary: 122 mg/dL — ABNORMAL HIGH (ref 70–99)
Glucose-Capillary: 148 mg/dL — ABNORMAL HIGH (ref 70–99)
Glucose-Capillary: 90 mg/dL (ref 70–99)

## 2024-11-22 LAB — RENAL FUNCTION PANEL
Albumin: 2.4 g/dL — ABNORMAL LOW (ref 3.5–5.0)
Anion gap: 16 — ABNORMAL HIGH (ref 5–15)
BUN: 110 mg/dL — ABNORMAL HIGH (ref 6–20)
CO2: 27 mmol/L (ref 22–32)
Calcium: 9.3 mg/dL (ref 8.9–10.3)
Chloride: 86 mmol/L — ABNORMAL LOW (ref 98–111)
Creatinine, Ser: 7.34 mg/dL — ABNORMAL HIGH (ref 0.44–1.00)
GFR, Estimated: 6 mL/min — ABNORMAL LOW
Glucose, Bld: 156 mg/dL — ABNORMAL HIGH (ref 70–99)
Phosphorus: 3.1 mg/dL (ref 2.5–4.6)
Potassium: 4 mmol/L (ref 3.5–5.1)
Sodium: 128 mmol/L — ABNORMAL LOW (ref 135–145)

## 2024-11-22 MED ORDER — DELFLEX-LC/1.5% DEXTROSE 344 MOSM/L IP SOLN: INTRAPERITONEAL | Status: AC

## 2024-11-22 MED ORDER — SODIUM CHLORIDE 0.9 % IV SOLN: INTRAVENOUS | Status: AC

## 2024-11-22 MED ORDER — OMEPRAZOLE 20 MG PO TBDD
40.0000 mg | DELAYED_RELEASE_TABLET | Freq: Two times a day (BID) | ORAL | Status: AC
  Administered 2024-11-22: 40 mg via ORAL
  Filled 2024-11-22 (×2): qty 2

## 2024-11-22 NOTE — TOC Progression Note (Signed)
 Transition of Care [facility]) - Progression Note    Patient Details  Name: [name] MRN: [id number] Date of Birth: April 02, 1966  Transition [facility]) CM/SW Contact  Tom-Johnson, Keishia Ground Daphne, RN Phone Number: 11/22/2024, 2:52 PM  Clinical Narrative:     CM spoke with [name] with [facility] [phone]) about update on estimated discharge date. CM informed [name] that patient is working with PT for physical conditioning over the next few days and then d/c home. [name] requested to notify [facility] 24 hrs prior d/c so that they can arrange for a nurse from [place] to do Enteral feeding education. Patient currently has a Cortrak in place. Patient receives her Enteral  feeding supplies from [facility].   CM will continue to follow as patient progresses with care towards discharge.       Expected Discharge Plan:  (TBD) Barriers to Discharge: Continued Medical Work up               Expected Discharge Plan and Services In-house Referral: Clinical Social Work Discharge Planning Services: CM Consult   Living arrangements for the past 2 months: Single Family Home                                       Social Drivers of Health (SDOH) Interventions SDOH Screenings   Food Insecurity: No Food Insecurity (11/19/2024)  Housing: Low Risk (11/19/2024)  Transportation Needs: No Transportation Needs (11/19/2024)  Utilities: Not At Risk (11/19/2024)  Alcohol  Screen: Low Risk (06/27/2024)  Depression (PHQ2-9): Medium Risk (10/23/2024)  Financial Resource Strain: Low Risk  (11/15/2024)   Received from [facility]  Physical Activity: Inactive (06/27/2024)  Social Connections: Unknown (06/27/2024)  Stress: Stress Concern Present (06/27/2024)  Tobacco Use: Medium Risk (10/23/2024)  Health Literacy: Adequate Health Literacy (06/27/2024)    Readmission Risk Interventions    09/27/2024    3:45 PM 09/12/2024    9:43 AM 02/21/2023     4:05 PM  Readmission Risk Prevention Plan  Transportation Screening Complete Complete Complete  PCP or Specialist Appt within 3-5 Days  Complete   HRI or Home Care Consult  Complete   Social Work Consult for Recovery Care Planning/Counseling  Complete   Palliative Care Screening  Not Applicable   Medication Review Oceanographer) Complete Referral to Pharmacy Complete  PCP or Specialist appointment within 3-5 days of discharge Complete  Complete  HRI or Home Care Consult Complete  Complete  SW Recovery Care/Counseling Consult Complete  Complete  Palliative Care Screening Not Applicable  Not Applicable  Skilled Nursing Facility Not Applicable  Not Applicable

## 2024-11-22 NOTE — Progress Notes (Addendum)
 " [facility] Progress Note   Subjective:   Seen in room. No acute events overnight. Had some cramping with last cycle of PD per dialysis nurse. Denies SOB, chest pain, swelling.   Objective Vitals:   11/21/24 1516 11/21/24 2011 11/22/24 0418 11/22/24 0802  BP: (!) 118/59 116/62 133/60 119/64  Pulse: 99 98 99 (!) 105  Resp: 19   17  Temp: 98.2 F (36.8 C) 98.7 F (37.1 C) 98.5 F (36.9 C) 97.7 F (36.5 C)  TempSrc:      SpO2: 90% 92% 94% 93%  Weight:      Height:       Physical Exam General: Lying in bed resting. NAD. Appears tired.  Heart:Mildly tachycardic. No M/R/G.  Lungs: CTAB Abdomen: Non tender, PD cath in place. Extremities: Edematous L arm, mildly tender to touch. No edema in b/I LE.Poor skin turgor.  Dialysis Access: PD cath mid abdomen in tact  Additional Objective Labs: Basic Metabolic Panel: Recent Labs  Lab 11/16/24 0258 11/17/24 0155 11/19/24 0705 11/20/24 0330 11/21/24 1031 11/22/24 0930  NA 130*   < > 128*  --  128* 128*  K 4.5   < > 4.1  --  4.1 4.0  CL 87*   < > 85*  --  86* 86*  CO2 24   < > 25  --  26 27  GLUCOSE 91   < > 78  --  116* 156*  BUN 96*   < > 107*  --  111* 110*  CREATININE 9.10*   < > 7.59*  --  7.27* 7.34*  CALCIUM  8.7*   < > 8.9 8.4* 9.0 9.3  PHOS 3.8  --   --   --  3.2 3.1   < > = values in this interval not displayed.   Liver Function Tests: Recent Labs  Lab 11/17/24 0155 11/18/24 0148 11/19/24 0705 11/21/24 1031 11/22/24 0930  AST 82* 70* 87*  --   --   ALT 64* 59* 61*  --   --   ALKPHOS 1,196* 1,092* 1,078*  --   --   BILITOT 0.6 0.5 0.5  --   --   PROT 6.6 6.3* 6.1*  --   --   ALBUMIN  2.3* 2.3* 2.3* 2.3* 2.4*   Recent Labs  Lab 11/15/24 1225  LIPASE 21   CBC: Recent Labs  Lab 11/15/24 1225 11/16/24 0258 11/17/24 0155 11/19/24 0705 11/19/24 1431 11/19/24 1832 11/20/24 0330 11/21/24 0306  WBC 13.1* 13.5* 17.7* 13.3*  --   --  11.5* 15.6*  NEUTROABS 9.8*  --   --   --   --   --   --    --   HGB 9.3* 9.2* 8.6* 7.4*   < > 6.5* 9.5* 9.5*  HCT 26.9* 25.7* 24.1* 20.5*   < > 18.7* 27.5* 27.1*  MCV 101.9* 99.6 98.4 99.0  --   --  96.8 97.8  PLT 142* 138* 151 111*  --   --  123* 126*   < > = values in this interval not displayed.   CBG: Recent Labs  Lab 11/21/24 1517 11/21/24 2007 11/22/24 0024 11/22/24 0416 11/22/24 0752  GLUCAP 107* 91 102* 115* 148*    Medications:  sodium chloride      dialysis solution 1.5% low-MG/low-CA      allopurinol   100 mg Per Tube Daily   apixaban   10 mg Per Tube BID   Followed by   NOREEN ON 11/27/2024] apixaban   5 mg Per Tube BID   aspirin   81 mg Per Tube Daily   busPIRone   10 mg Per Tube BID   dolutegravir   50 mg Per Tube Daily   emtricitabine -tenofovir  AF  1 tablet Per Tube Daily   feeding supplement (NEPRO CARB STEADY)  1,000 mL Per Tube Q24H   feeding supplement (PROSource TF20)  60 mL Per Tube Daily   gentamicin  cream  1 Application Topical Daily   hydrocerin   Topical BID   hydrocortisone   15 mg Per Tube Q0600   And   hydrocortisone   5 mg Per Tube Q1400   insulin  aspart  0-6 Units Subcutaneous Q4H   levothyroxine   125 mcg Per Tube Q0600   mirtazapine   15 mg Per Tube QHS   multivitamin  1 tablet Per Tube QHS   pantoprazole  (PROTONIX ) IV  40 mg Intravenous Daily   rosuvastatin   10 mg Per Tube Daily   sodium chloride  flush  3 mL Intravenous Q12H   Vitamin D  (Ergocalciferol )  50,000 Units Oral Q7 days    Dialysis Orders  OP PD: Dr Norine Flint  5 cycles, 2.25 fill vol, 2.5% dianeal , 1.5h dwell  74.5kg dry wt   Assessment/Plan: Hyperkalemia: resolved with PD and lokelma .  Hyponatremia: mild, manage with PD.  ESRD on PD: Recent workup at [facility] negative for infection, cont nightly PD here.  HTN/volume: Bps normal/low normal, have improved with hydrocortisone . HR elevated at times. Somewhat dry on exam. Cramped with last cycle of PD overnight. Will give 75 mL/hr NS for 12 hours. PD with all 1.5% fluids tonight.  Anemia  of ESRD: Hgb 9.5 s/p 1 unit on 2/3. Cont to be on anticoagulation for DVT. Monitor.  Secondary HPTH: Ca and phos ok.   Dysphagia/malnutrition: cont Nephro tube feeds.  Elevated AST/ALT/ALP: GI following, conservative management for now.  TIIDM: per primary Left upper extremity DVT: Likely provoked. Anticoagulated per primary. 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 Chronic HFrEF: LVEF 20%. Metoprolol  + losartan  on hold given hypotension.  HIV: per primary   Signe Sick, PA-C 11/22/2024, 10:44 AM  [facility]  Pt looks a bit dry on exam. Will give some IVF's and change her PD UF to the lowest vol possible.  Pt seen, examined and agree w A/P as above.  Myer Fret MD  [facility] 11/22/2024, 3:07 PM   "

## 2024-11-22 NOTE — Procedures (Signed)
 Objective Swallowing Evaluation: Type of Study: FEES-Fiberoptic Endoscopic Evaluation of Swallow   Patient Details  Name: [name] MRN: [id number] Date of Birth: 57-May-1967  Today's Date: 11/22/2024 Time: SLP Start Time (ACUTE ONLY): 1052 -SLP Stop Time (ACUTE ONLY): 1121  SLP Time Calculation (min) (ACUTE ONLY): 29 min   Past Medical History:  Past Medical History:  Diagnosis Date   Acute pain of right shoulder due to trauma 06/08/2023   Acute pancreatitis 10/23/2021   Acute renal failure superimposed on stage 4 chronic kidney disease (HCC) 10/24/2021   Allergy    Anemia    Normocytic   Antibiotic-induced yeast infection 09/28/2020   Anxiety    Asthma    Bronchitis 2005   Bursitis of left shoulder 09/06/2019   CKD (chronic kidney disease)    Dialysis on Mon-Wed- Fri   CLASS 1-EXOPHTHALMOS-THYROTOXIC 02/08/2007   Cognitive dysfunction 02/21/2020   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  Genes Analyzed (43 total): APC*, ATM*, AXIN2, BARD1, BMPR1A, BRCA1*, BRCA2*, BRIP1*, CDH1*, CDK4, CDKN2A, CHEK2*, DICER1, GALNT12, HOXB13, MEN1, MLH1*, MRE11A, MSH2*, MSH3, MSH6*, MUTYH*, NBN, NF1*, NTHL1, PALB2*, PMS2*, POLD1, POLE, PTEN*, RAD50, RAD51C*, RAD51D*, RET, SDHB, SDHD, SMAD4, SMARCA4, STK11 and TP53* (sequencing and deletion/duplication); EGFR (s   GERD 07/24/2006   GRAVE'S DISEASE  01/01/2008   History of hidradenitis suppurativa    History of kidney stones    History of thrush    HIV DISEASE 07/24/2006   dx March 05   Hyperlipidemia    HYPERTENSION 07/24/2006   Hyperthyroidism 08/2006   Grave's Disease -diffuse radiotracer uptake 08/25/06 Thyroid  scan-Cold nodule to R lower lobe of thyrorid   Ileus (HCC) 02/14/2023   Left bundle branch block (LBBB)    Malnutrition of moderate degree 02/08/2023   Menometrorrhagia    hx of   Nephrolithiasis    Panniculitis 05/12/2020   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 Completed 2 yrs Prednisone  after lung bx confirmation. No symptoms since then.   Sebaceous cyst 04/21/2020   Shock (HCC) 09/23/2024   Suppurative hidradenitis    Thyroid  cancer (HCC)    THYROID  NODULE, RIGHT 02/08/2007   Past Surgical History:  Past Surgical History:  Procedure Laterality Date   AORTIC ARCH DEBRANCHING N/A 08/25/2023   Procedure: AORTIC ARCH DEBRANCHING  USING 12X8X8MM HEMASHIELD GRAFT;  Surgeon: Lucas Dorise POUR, MD;  Location: [facility];  Service: Open Heart Surgery;  Laterality: N/A;  with bypasses to the innominate and left common carotid arteries   APPLICATION OF WOUND VAC N/A 01/20/2021   Procedure: APPLICATION OF WOUND VAC;  Surgeon: Lowery Estefana RAMAN, DO;  Location: [facility];  Service: Plastics;  Laterality: N/A;    BREAST EXCISIONAL BIOPSY Right 04/26/2018   right axilla negative   BREAST EXCISIONAL BIOPSY Left 04/26/2018   left axilla negative   BREAST LUMPECTOMY Right 10/03/2018   malignant   BREAST LUMPECTOMY WITH RADIOACTIVE SEED AND SENTINEL LYMPH NODE BIOPSY Right 10/03/2018   Procedure: RIGHT BREAST LUMPECTOMY WITH RADIOACTIVE SEED AND SENTINEL LYMPH NODE MAPPING;  Surgeon: Vanderbilt Ned, MD;  Location: [facility];  Service: General;  Laterality: Right;   BREAST SURGERY  1997   Breast Reduction    CAPD INSERTION N/A 12/21/2023   Procedure: LAPAROSCOPIC INSERTION CONTINUOUS AMBULATORY PERITONEAL DIALYSIS  (CAPD) CATHETER; LAPAROSCOPIC OMENTUMPEXY;  Surgeon: Magda Ned SAILOR, MD;  Location: [facility];  Service: Vascular;  Laterality: N/A;   CYSTOSCOPY W/ URETERAL STENT REMOVAL  11/09/2012   Procedure: CYSTOSCOPY WITH STENT REMOVAL;  Surgeon: Ricardo Likens, MD;  Location: [facility];  Service: Urology;  Laterality: Right;   CYSTOSCOPY WITH RETROGRADE PYELOGRAM, URETEROSCOPY AND STENT PLACEMENT  11/09/2012   Procedure: CYSTOSCOPY WITH RETROGRADE PYELOGRAM, URETEROSCOPY AND STENT PLACEMENT;  Surgeon: Ricardo Likens, MD;  Location: [facility];  Service: Urology;  Laterality: Left;  LEFT URETEROSCOPY, STONE MANIPULATION, left STENT exchange    CYSTOSCOPY WITH STENT PLACEMENT  10/02/2012   Procedure: CYSTOSCOPY WITH STENT PLACEMENT;  Surgeon: Ricardo Likens, MD;  Location: [facility];  Service: Urology;  Laterality: Left;   DEBRIDEMENT AND CLOSURE WOUND N/A 01/20/2021   Procedure: Excision of abdominal wound with closure;  Surgeon: Lowery Estefana RAMAN, DO;  Location: [facility];  Service: Plastics;  Laterality: N/A;   DILATION AND CURETTAGE OF UTERUS  11/2002   s/p for 1st trimester nonviable pregnancy   ESOPHAGOGASTRODUODENOSCOPY N/A 09/11/2024   Procedure: EGD (ESOPHAGOGASTRODUODENOSCOPY);  Surgeon: Abran Norleen SAILOR, MD;  Location[facility];  Service: Gastroenterology;  Laterality: N/A;   EYE SURGERY      sty under eyelid   INCISE AND DRAIN ABCESS  08/2002   s/p I &D for righ inframmary fold hidradenitis   INCISION AND DRAINAGE PERITONSILLAR ABSCESS  12/2001   IR CV LINE INJECTION  06/07/2018   IR FLUORO GUIDE CV LINE RIGHT  01/30/2023   IR IMAGING GUIDED PORT INSERTION  06/20/2018   IR REMOVAL TUN ACCESS W/ PORT W/O FL MOD SED  06/20/2018   IR US  GUIDE VASC ACCESS RIGHT  01/30/2023   IRRIGATION AND DEBRIDEMENT ABSCESS  01/31/2012   Procedure: IRRIGATION AND DEBRIDEMENT ABSCESS;  Surgeon: Alm VEAR Angle, MD;  [facility]: [facility];  Service: General;  Laterality: Right;  right breast and axilla    LAPAROSCOPIC REPOSITIONING CAPD CATHETER Left 01/10/2024   Procedure: Removal of peritoneal dialysis Catheter.;  Surgeon: Magda Ned SAILOR, MD;  Location[facility] OR;  Service: Vascular;  Laterality: Left;   LAPAROSCOPY N/A 01/10/2024   Procedure: LAPAROSCOPY, DIAGNOSTIC;  Surgeon: Magda Ned SAILOR, MD;  Location: [facility];  Service: Vascular;  Laterality: N/A;   NEPHROLITHOTOMY  10/02/2012   Procedure: NEPHROLITHOTOMY PERCUTANEOUS;  Surgeon: Ricardo Likens, MD;  [facility]: [facility];  Service: Urology;  Laterality: Right;  First Stage Percutaneous Nephrolithotomy with Surgeon Access, Left Ureteral Stent  NEPHROLITHOTOMY  10/04/2012   Procedure: NEPHROLITHOTOMY PERCUTANEOUS SECOND LOOK;  Surgeon: Ricardo Likens, MD;  [facility];  Service: Urology;  Laterality: Right;      NEPHROLITHOTOMY  10/08/2012   Procedure: NEPHROLITHOTOMY PERCUTANEOUS;  Surgeon: Ricardo Likens, MD;  [facility]: [facility];  Service: Urology;  Laterality: Right;  THIRD STAGE, nephrostomy tube exchange x 2   NEPHROLITHOTOMY  10/11/2012   Procedure: NEPHROLITHOTOMY PERCUTANEOUS SECOND LOOK;  Surgeon: Ricardo Likens, MD;  [facility]: [facility];  Service: Urology;  Laterality: Right;  RIGHT 4 STAGE PERCUTANOUS NEPHROLITHOTOMY, right URETEROSCOPY WITH HOLMIUM LASER    PANNICULECTOMY N/A 12/21/2020   Procedure: PANNICULECTOMY;  Surgeon: Lowery Estefana RAMAN, DO;  [facility];  Service: Plastics;  Laterality: N/A;   PERCUTANEOUS NEPHROSTOLITHOTOMY  04/2022   PORT-A-CATH REMOVAL N/A 07/16/2020   Procedure: REMOVAL PORT-A-CATH;  Surgeon: Vanderbilt Ned, MD;  Location: [facility];  Service: General;  Laterality: N/A;   PORTACATH PLACEMENT Left 05/17/2018   Procedure: INSERTION PORT-A-CATH;  Surgeon: Vernetta Berg, MD;  Location: [facility];  Service: General;  Laterality: Left;   RADICAL NECK DISSECTION  04/12/2017   limited/notes 04/12/2017   RADICAL NECK DISSECTION N/A 04/12/2017   Procedure: RADICAL NECK DISSECTION;  Surgeon: Carlie Clark, MD;  Location[facility] OR;  Service: ENT;  Laterality: N/A;  limited neck dissection 2 hours total   REDUCTION MAMMAPLASTY Bilateral 1998   RIGHT/LEFT HEART CATH AND CORONARY ANGIOGRAPHY N/A 03/12/2020   Procedure: RIGHT/LEFT HEART CATH AND CORONARY ANGIOGRAPHY;  Surgeon: Cherrie Toribio SAUNDERS, MD;  [facility];  Service: Cardiovascular;  Laterality: N/A;   RIGHT/LEFT HEART CATH AND CORONARY ANGIOGRAPHY N/A 09/26/2024   Procedure: RIGHT/LEFT HEART CATH AND CORONARY ANGIOGRAPHY;  Surgeon: Darron Deatrice LABOR, MD;  [facility];  Service: Cardiovascular;  Laterality: N/A;   [place]  1994   TEE WITHOUT CARDIOVERSION N/A 08/25/2023   Procedure: TRANSESOPHAGEAL ECHOCARDIOGRAM;  Surgeon: Lucas Dorise POUR, MD;  [facility];  Service: Open Heart Surgery;  Laterality: N/A;   TENCKHOFF CATHETER INSERTION Left 01/10/2024   Procedure: INSERTION, CATHETER, DIALYSIS, PERITONEAL;  Surgeon: Magda Ned SAILOR, MD;  [facility]: [facility];  Service: Vascular;  Laterality: Left;   THORACIC AORTIC ENDOVASCULAR STENT GRAFT N/A 02/02/2023   Procedure: THORACIC AORTIC ENDOVASCULAR STENT GRAFT;  Surgeon: Lanis Fonda BRAVO, MD;  [facility] OR;  Service: Vascular;  Laterality: N/A;   THORACIC AORTIC ENDOVASCULAR STENT GRAFT N/A 08/25/2023   Procedure: THORACIC AORTIC  ENDOVASCULAR STENT GRAFT;  Surgeon: Lanis Fonda BRAVO, MD;  Location: [facility] OR;  Service: Vascular;  Laterality: N/A;   THYROIDECTOMY  04/12/2017   completion/notes 04/12/2017   THYROIDECTOMY N/A 04/12/2017   Procedure: THYROIDECTOMY;  Surgeon: Carlie Clark, MD;  Location: [facility] OR;  Service: ENT;  Laterality: N/A;  Completion Thyroidectomy   TOTAL THYROIDECTOMY  2010   ULTRASOUND GUIDANCE FOR VASCULAR ACCESS Right 08/25/2023   Procedure: ULTRASOUND GUIDANCE FOR VASCULAR ACCESS, RIGHT FEMORAL ARTERY;  Surgeon: Lanis Fonda BRAVO, MD;  Location: [facility] OR;  Service: Vascular;  Laterality: Right;   HPI: [name] is a 59 y.o. female who presented 1/30 secondary to weakness and fatigue, found to have hyperkalemia with mild hyponatremia. During workup, patient was also found to have a significantly elevated alkaline phosphatase with elevated AST/ALT. GI consulted. Left upper arm DVT identified on 2/2; heparin  IV started. Pt with hx dysphagia which is primarily esophageal in nature per chart reveiw.  Pt seen by SLP at [facility] earlier this  month with MBSS ordered, but not completed.  Seen by ST x2 in December as well.  Pt with a history of hypertension, hyperlipidemia, diabetes mellitus, hypothyroidism, HIV, ESRD on PD, chronic systolic heart failure, anxiety, depression, dysphagia, aortic aneurysm, aortic dissection s/p repair, pituitary apoplexy, adrenal insufficiency, sarcoid, anemia, breast cancer, gout, asthma.   Subjective: Pt awake, alert, pleasant, participative    Assessment / Plan / Recommendation     11/22/2024   11:36 AM  Clinical Impressions  Clinical Impression Pt presents with normal oropharyngeal swallow function.  There was no penetration or aspiration of any consistencies trialed, including thin liquid diary.  On scope passage there were copious thick, yellow secretions in pharyx and laryngeal vestuble, some stringing visualized across tracheal as well.  Pt give ice chips and water  to thin  and mobilize secretions, which were greatly reduced across trials. Cued cough was not very effective, but secretions appeared very thick and tenacious. There remained a small amount of mucus in the interarytenoid space encroaching into posterior laryngeal vestibule.  Recommend suction set up to assist with clearance of secretions which pt is able to cough up.  Pt also states she would like her inhaler, RN notified.  Today's assessment suggests that pt's dysphagia is primarily esophageal.  She is protecting her airway adequately to tolerate a regular texture diet.  Her pharynx is narrow and there was swelling in some of the tissues covering the laryngeal cartileges as well as some cobblestoning suggestive of reflux, but no backflow was observed from the esophagus into the pharynx on today's evaluation.  Recommend follow up with GI for further assessment of esophageal swallow function.  Pt has no further ST needs. SLP will sign off.   Recommend regular texture diet with thin liquids wiht pt choosing foods which are easier for her to consume.    SLP Visit Diagnosis Dysphagia, pharyngoesophageal phase (R13.14)  Attention and concentration deficit following --  Frontal lobe and executive function deficit following --  Impact on safety and function Risk for inadequate nutrition/hydration         11/22/2024   11:36 AM  Treatment Recommendations  Treatment Recommendations No treatment recommended at this time        11/22/2024   11:39 AM  Prognosis  Prognosis for improved oropharyngeal function --  Barriers to Reach Goals --  Barriers/Prognosis Comment --    Swallow Evaluation Recommendations Liquid Administration via: Cup;Straw Medication Administration: Crushed with puree          11/22/2024   11:36 AM  Other Recommendations  Recommended Consults --  Oral Care Recommendations --  Caregiver Recommendations --  Follow Up Recommendations No SLP follow up  Assistance recommended at discharge  --  Functional Status Assessment Patient has not had a recent decline in their functional status       11/22/2024   11:36 AM  Frequency and Duration   Speech Therapy Frequency (ACUTE ONLY) --  Treatment Duration --         11/22/2024   11:34 AM  Oral Phase  Oral Phase --  Oral - Pudding Teaspoon --  Oral - Pudding Cup --  Oral - Honey Teaspoon --  Oral - Honey Cup --  Oral - Nectar Teaspoon --  Oral - Nectar Cup --  Oral - Nectar Straw --  Oral - Thin Teaspoon --  Oral - Thin Cup WFL  Oral - Thin Straw WFL  Oral - Puree WFL  Oral - Mech Soft WFL  Oral -  Regular --  Oral - Multi-Consistency --  Oral - Pill --  Oral Phase - Comment --       11/22/2024   11:35 AM  Pharyngeal Phase  Pharyngeal Phase --  Pharyngeal- Pudding Teaspoon --  Pharyngeal --  Pharyngeal- Pudding Cup --  Pharyngeal --  Pharyngeal- Honey Teaspoon --  Pharyngeal --  Pharyngeal- Honey Cup --  Pharyngeal --  Pharyngeal- Nectar Teaspoon --  Pharyngeal --  Pharyngeal- Nectar Cup --  Pharyngeal --  Pharyngeal- Nectar Straw --  Pharyngeal --  Pharyngeal- Thin Teaspoon --  Pharyngeal --  Pharyngeal- Thin Cup [facility]  Pharyngeal Material does not enter airway  Pharyngeal- Thin Straw WFL  Pharyngeal Material does not enter airway  Pharyngeal- Puree WFL  Pharyngeal Material does not enter airway  Pharyngeal- Mechanical Soft WFL  Pharyngeal Material does not enter airway  Pharyngeal- Regular --  Pharyngeal --  Pharyngeal- Multi-consistency --  Pharyngeal --  Pharyngeal- Pill --  Pharyngeal --  Pharyngeal Comment --        11/22/2024   11:30 AM  Cervical Esophageal Phase   Cervical Esophageal Phase WFL  Pudding Teaspoon --  Pudding Cup --  Honey Teaspoon --  Honey Cup --  Nectar Teaspoon --  Nectar Cup --  Nectar Straw --  Thin Teaspoon --  Thin Cup --  Thin Straw --  Puree --  Mechanical Soft --  Regular --  Multi-consistency --  Pill --  Cervical Esophageal Comment --      Anette FORBES Grippe, MA, CCC-SLP Acute Rehabilitation Services Office: [phone] 11/22/2024, 11:39 AM

## 2024-11-22 NOTE — Progress Notes (Signed)
 " PROGRESS NOTE    [name]  FMW:[id number] DOB: [birth year] DOA: 11/15/2024 PCP: Gretta Comer POUR, NP   Brief Narrative:  [name] is a 59 y.o. female with a history of hypertension, hyperlipidemia, diabetes mellitus, hypothyroidism, HIV, ESRD on PD, chronic systolic heart failure, anxiety, depression, dysphagia, aortic aneurysm, aortic dissection s/p repair, pituitary apoplexy, adrenal insufficiency, sarcoid, anemia, breast cancer, gout, asthma.  Patient presented secondary to weakness and fatigue, found to have hyperkalemia with mild hyponatremia. During workup, patient was also found to have a significantly elevated alkaline phosphatase with elevated AST/ALT. GI consulted. Left upper arm DVT identified on 2/2; heparin  IV started.  Patient is at this time medically stable for discharge, unfortunately due to her multiple comorbidities she is not a candidate for SNF placement as such will need to be cleared for discharge home with home health.  She cannot, at this time, walk per evaluation with PT making her a high risk for discharge home.  Hopeful for some recovery in her physical conditioning over the next few days with plan for subsequent discharge home with family and home health as below.  Assessment & Plan:   Principal Problem:   Hyperkalemia Active Problems:   History of aortic dissection   Hypertension   HIV (human immunodeficiency virus infection) (HCC)   Type 2 diabetes mellitus with hyperlipidemia (HCC)   Hypothyroidism   Depression   Asthma   GAD (generalized anxiety disorder)   Hyperlipidemia   ESRD (end stage renal disease) (HCC)   Normocytic anemia   Pharyngoesophageal dysphagia   Chronic gout   H/O aortic aneurysm repair   Benign neoplasm of pituitary gland (HCC)   Abnormal esophagram   Hypoglycemia   HFrEF (heart failure with reduced ejection fraction) (HCC)   Pituitary apoplexy   History of breast cancer   LFT elevation   Cholestasis  (HCC)   Elevated alkaline phosphatase level   Cholestatic hepatitis   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  - Hemoglobin downtrending -concern for bleeding but no obvious sources at this time, continues to require anticoagulation - FOBT pending but previously positive in December (2 of 3 samples) despite ongoing brown stool - Transfused 1u PRBC 11/19/24 - Haptoglobin wnl, LDH elevated in isolation, Tbili WNL  Hyperkalemia Likely secondary to ESRD. Resolved with with Lokelma  and dialysis.   ESRD on PD Nephrology consulted for peritoneal dialysis. Regular diet (okay with nephrology)   Hyponatremia Mild. Management with peritoneal dialysis.   Diabetes mellitus type 2 Well controlled - most recent hemoglobin A1C of 5.6%.   Dysphagia Esophageal dysmotility Patient with workup at [facility]. Recommendation at that time was for discharge with an NG tube with home health services, in addition to outpatient follow-up for manometry. - Continue tube feeds - Encourage oral intake -- Dietician recommendations (2/2): Continue tube feeding via small bore feeding tube: Nepro Carb Steady at 60 ml/h x16 hours 6PM-10AM (960 ml per day) Prosource TF20 60 ml daily MONITOR NEED FOR FWF Provides 1779 kcal, 98 gm protein, 698 ml free water  daily    Elevated alkaline phosphatase Elevated AST/ALT Unclear etiology. GGT elevated, suggesting biliary source. RUQ ultrasound significant for cholelithiasis without evidence of cholecystitis; patient without pain -Continue conservative management per GI - Follow-up labs improving, within normal limits   Left upper extremity DVT Acute SVTs Likely provoked based on recent history - DVT  involves the left radial veins.   Pituitary apoplexy Adrenal insufficiency Patient started on stress dose steroids secondary to hypotension with  improved blood pressure readings.  - Continue hydrocortisone  (15 mg q0600 and 5 mg q1400)   Hypotension, resolved Likely related to adrenal insufficiency.  Midodrine  discontinued. - Continue treatment of adrenal sufficiency as above -Continue to hold home metoprolol  and losartan    Hypothyroidism - S/p thyroidectomy. - Continue Synthroid  - most recent TSH/Free T4 WNL   Chronic HFrEF LVEF of 20% on most recent Transthoracic Echocardiogram from admission at [facility]. Not able to tolerate goal directed medical therapy secondary to hypotension. - Strict in/out - Metoprolol /losartan  on hold given hypotension   HIV disease - Continue Descovy  and [name]  History of aortic dissection/aortic aneurysm S/p repair of dissection - Continue aspirin  Nausea and vomiting Resolved - Zofran  as needed Asthma - Well controlled, Continue albuterol  as needed Gout - Continue allopurinol  Depression.Anxiety - Continue Remeron  and BuSpar  History of breast cancer Patient is s/p lumpectomy, chemotherapy and immunotherapy  DVT prophylaxis:  apixaban  (ELIQUIS ) tablet 10 mg  apixaban  (ELIQUIS ) tablet 5 mg   Code Status:   Code Status: Full Code  Family Communication: None present  Status is: Inpatient  Dispo: The patient is from: Home              Anticipated d/c is to: To be determined              Anticipated d/c date is: 24-hour              Patient currently is medically stable for discharge  Consultants:  PCCM, nephrology  Antimicrobials:  None  Subjective: No acute issues or events overnight, still having difficulty ambulating with physical therapy but otherwise denies nausea vomiting diarrhea constipation headache fevers chills or chest pain  Objective: Vitals:   11/21/24 1516 11/21/24 2011 11/22/24 0418 11/22/24 0802  BP: (!) 118/59 116/62 133/60 119/64  Pulse: 99 98 99 (!) 105  Resp: 19   17  Temp: 98.2 F (36.8 C) 98.7 F (37.1 C) 98.5 F (36.9 C) 97.7 F (36.5 C)  TempSrc:       SpO2: 90% 92% 94% 93%  Weight:      Height:        Intake/Output Summary (Last 24 hours) at 11/22/2024 0814 Last data filed at 11/21/2024 1720 Gross per 24 hour  Intake 120 ml  Output 0 ml  Net 120 ml   Filed Weights   11/16/24 0500 11/17/24 0341 11/19/24 0342  Weight: 75.6 kg 75 kg 78 kg    Examination:  General:  Pleasantly resting in bed, No acute distress. HEENT: NG intact Neck:  Without mass or deformity.  Trachea is midline. Lungs:  Clear to auscultate bilaterally without rhonchi, wheeze, or rales. Heart:  Regular rate and rhythm.  Without murmurs, rubs, or gallops. Abdomen:  Soft, nontender, nondistended.  Without guarding or rebound. Extremities: Without cyanosis, clubbing, edema, or obvious deformity. Skin:  Warm and dry, no erythema.   Data Reviewed: I have personally reviewed following labs and imaging studies  CBC: Recent Labs  Lab 11/15/24 1225 11/16/24 0258 11/17/24 0155 11/19/24 0705 11/19/24 1431 11/19/24 1832 11/20/24 0330 11/21/24 0306  WBC 13.1* 13.5* 17.7* 13.3*  --   --  11.5* 15.6*  NEUTROABS 9.8*  --   --   --   --   --   --   --   HGB 9.3* 9.2* 8.6* 7.4* 7.0* 6.5* 9.5* 9.5*  HCT 26.9* 25.7*  24.1* 20.5* 20.2* 18.7* 27.5* 27.1*  MCV 101.9* 99.6 98.4 99.0  --   --  96.8 97.8  PLT 142* 138* 151 111*  --   --  123* 126*   Basic Metabolic Panel: Recent Labs  Lab 11/15/24 1225 11/16/24 0258 11/17/24 0155 11/17/24 1945 11/18/24 0148 11/19/24 0705 11/20/24 0330 11/21/24 1031  NA 131* 130* 124* 125* 128* 128*  --  128*  K 5.8* 4.5 4.8 4.9 4.4 4.1  --  4.1  CL 88* 87* 82* 84* 84* 85*  --  86*  CO2 23 24 22 22 23 25   --  26  GLUCOSE 49* 91 218* 96 135* 78  --  116*  BUN 103* 96* 92* 105* 101* 107*  --  111*  CREATININE 9.69* 9.10* 8.30* 8.63* 8.01* 7.59*  --  7.27*  CALCIUM  8.9 8.7* 8.6* 8.6* 8.6* 8.9 8.4* 9.0  MG 2.1 2.0  --   --   --   --   --   --   PHOS  --  3.8  --   --   --   --   --  3.2   GFR: Estimated Creatinine Clearance:  8.9 mL/min (A) (by C-G formula based on SCr of 7.27 mg/dL (H)). Liver Function Tests: Recent Labs  Lab 11/15/24 1225 11/16/24 0258 11/17/24 0155 11/18/24 0148 11/19/24 0705 11/21/24 1031  AST 103* 86* 82* 70* 87*  --   ALT 78* 65* 64* 59* 61*  --   ALKPHOS 1,218* 1,114* 1,196* 1,092* 1,078*  --   BILITOT 0.8 0.7 0.6 0.5 0.5  --   PROT 6.3* 5.9* 6.6 6.3* 6.1*  --   ALBUMIN  2.3* 2.0* 2.3* 2.3* 2.3* 2.3*   Recent Labs  Lab 11/15/24 1225  LIPASE 21   No results for input(s): AMMONIA in the last 168 hours. Coagulation Profile: Recent Labs  Lab 11/17/24 0155  INR 1.1   Cardiac Enzymes: No results for input(s): CKTOTAL, CKMB, CKMBINDEX, TROPONINI in the last 168 hours. BNP (last 3 results) No results for input(s): PROBNP in the last 8760 hours. HbA1C: No results for input(s): HGBA1C in the last 72 hours. CBG: Recent Labs  Lab 11/21/24 1517 11/21/24 2007 11/22/24 0024 11/22/24 0416 11/22/24 0752  GLUCAP 107* 91 102* 115* 148*   Lipid Profile: No results for input(s): CHOL, HDL, LDLCALC, TRIG, CHOLHDL, LDLDIRECT in the last 72 hours. Thyroid  Function Tests: No results for input(s): TSH, T4TOTAL, FREET4, T3FREE, THYROIDAB in the last 72 hours.  Anemia Panel: No results for input(s): VITAMINB12, FOLATE, FERRITIN, TIBC, IRON , RETICCTPCT in the last 72 hours. Sepsis Labs: No results for input(s): PROCALCITON, LATICACIDVEN in the last 168 hours.  Recent Results (from the past 240 hours)  C Difficile Quick Screen w PCR reflex     Status: None   Collection Time: 11/15/24  1:03 PM   Specimen: STOOL  Result Value Ref Range Status   C Diff antigen NEGATIVE NEGATIVE Final   C Diff toxin NEGATIVE NEGATIVE Final   C Diff interpretation No C. difficile detected.  Final    Comment: Performed at [facility], [address] [place], [place] [address]         Radiology Studies: No results  found.       Scheduled Meds:  allopurinol   100 mg Per Tube Daily   apixaban   10 mg Per Tube BID   Followed by   NOREEN ON 11/27/2024] apixaban   5 mg Per Tube BID  aspirin   81 mg Per Tube Daily   busPIRone   10 mg Per Tube BID   dolutegravir   50 mg Per Tube Daily   emtricitabine -tenofovir  AF  1 tablet Per Tube Daily   feeding supplement (NEPRO CARB STEADY)  1,000 mL Per Tube Q24H   feeding supplement (PROSource TF20)  60 mL Per Tube Daily   gentamicin  cream  1 Application Topical Daily   hydrocerin   Topical BID   hydrocortisone   15 mg Per Tube Q0600   And   hydrocortisone   5 mg Per Tube Q1400   insulin  aspart  0-6 Units Subcutaneous Q4H   levothyroxine   125 mcg Per Tube Q0600   mirtazapine   15 mg Per Tube QHS   multivitamin  1 tablet Per Tube QHS   pantoprazole  (PROTONIX ) IV  40 mg Intravenous Daily   rosuvastatin   10 mg Per Tube Daily   sodium chloride  flush  3 mL Intravenous Q12H   Vitamin D  (Ergocalciferol )  50,000 Units Oral Q7 days   Continuous Infusions:  dialysis solution 1.5% low-MG/low-CA     dialysis solution 2.5% low-MG/low-CA     dialysis solution 2.5% low-MG/low-CA       LOS: 6 days   Time spent:  Elsie JAYSON Montclair, DO [facility]  If 7PM-7AM, please contact night-coverage [email or web address]  11/22/2024, 8:14 AM      "

## 2024-11-22 NOTE — Plan of Care (Signed)

## 2024-11-28 ENCOUNTER — Inpatient Hospital Stay: Admitting: Primary Care
